# Patient Record
Sex: Female | Born: 1935 | Race: White | Hispanic: No | Smoking: Former smoker
Health system: Southern US, Community
[De-identification: ages and names within clinical notes are randomized; demographics above are authoritative.]

## PROBLEM LIST (undated history)

## (undated) DIAGNOSIS — I5042 Chronic combined systolic (congestive) and diastolic (congestive) heart failure: Secondary | ICD-10-CM

## (undated) DIAGNOSIS — I482 Chronic atrial fibrillation, unspecified: Secondary | ICD-10-CM

## (undated) DIAGNOSIS — I480 Paroxysmal atrial fibrillation: Secondary | ICD-10-CM

## (undated) DIAGNOSIS — IMO0001 Reserved for inherently not codable concepts without codable children: Secondary | ICD-10-CM

## (undated) DIAGNOSIS — I10 Essential (primary) hypertension: Secondary | ICD-10-CM

## (undated) DIAGNOSIS — K219 Gastro-esophageal reflux disease without esophagitis: Secondary | ICD-10-CM

## (undated) DIAGNOSIS — G8929 Other chronic pain: Secondary | ICD-10-CM

## (undated) DIAGNOSIS — Z950 Presence of cardiac pacemaker: Secondary | ICD-10-CM

## (undated) DIAGNOSIS — N189 Chronic kidney disease, unspecified: Secondary | ICD-10-CM

## (undated) DIAGNOSIS — M549 Dorsalgia, unspecified: Secondary | ICD-10-CM

## (undated) DIAGNOSIS — M1990 Unspecified osteoarthritis, unspecified site: Secondary | ICD-10-CM

## (undated) DIAGNOSIS — Z952 Presence of prosthetic heart valve: Secondary | ICD-10-CM

## (undated) DIAGNOSIS — IMO0002 Reserved for concepts with insufficient information to code with codable children: Secondary | ICD-10-CM

## (undated) DIAGNOSIS — I890 Lymphedema, not elsewhere classified: Secondary | ICD-10-CM

## (undated) HISTORY — DX: Lymphedema, not elsewhere classified: I89.0

## (undated) HISTORY — DX: Paroxysmal atrial fibrillation: I48.0

## (undated) HISTORY — DX: Chronic kidney disease, unspecified: N18.9

## (undated) HISTORY — PX: US ECHOCARDIOGRAPHY: HXRAD669

## (undated) HISTORY — PX: BREAST LUMPECTOMY: SHX2

## (undated) HISTORY — PX: CARDIAC CATHETERIZATION: SHX172

## (undated) HISTORY — PX: CARDIAC VALVE REPLACEMENT: SHX585

---
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
## 2006-10-08 ENCOUNTER — Ambulatory Visit: Payer: Self-pay | Admitting: Family Medicine

## 2006-10-13 ENCOUNTER — Ambulatory Visit: Payer: Self-pay | Admitting: Family Medicine

## 2006-11-12 ENCOUNTER — Ambulatory Visit: Payer: Self-pay | Admitting: Surgery

## 2006-12-03 ENCOUNTER — Other Ambulatory Visit: Payer: Self-pay

## 2006-12-03 ENCOUNTER — Ambulatory Visit: Payer: Self-pay | Admitting: Specialist

## 2006-12-10 ENCOUNTER — Ambulatory Visit: Payer: Self-pay | Admitting: Surgery

## 2012-02-29 ENCOUNTER — Emergency Department: Payer: Self-pay | Admitting: *Deleted

## 2012-03-22 ENCOUNTER — Inpatient Hospital Stay: Payer: Self-pay | Admitting: Internal Medicine

## 2012-03-22 LAB — COMPREHENSIVE METABOLIC PANEL
Albumin: 3.8 g/dL (ref 3.4–5.0)
Alkaline Phosphatase: 85 U/L (ref 50–136)
Calcium, Total: 9 mg/dL (ref 8.5–10.1)
Co2: 27 mmol/L (ref 21–32)
EGFR (African American): >60
Glucose: 129 mg/dL — ABNORMAL HIGH (ref 65–99)
SGOT(AST): 23 U/L (ref 15–37)
Sodium: 139 mmol/L (ref 136–145)

## 2012-03-22 LAB — CBC WITH DIFFERENTIAL/PLATELET
Basophil #: 0 10*3/uL (ref 0.0–0.1)
Basophil %: 0.4 %
Eosinophil %: 0.1 %
Lymphocyte #: 0.7 10*3/uL — ABNORMAL LOW (ref 1.0–3.6)
Lymphocyte %: 5.7 %
MCV: 92 fL (ref 80–100)
Monocyte %: 3.4 %
Neutrophil #: 10.9 10*3/uL — ABNORMAL HIGH (ref 1.4–6.5)
Neutrophil %: 90.4 %
Platelet: 261 10*3/uL (ref 150–440)
RBC: 4.34 10*6/uL (ref 3.80–5.20)
RDW: 13.6 % (ref 11.5–14.5)
WBC: 12 10*3/uL — ABNORMAL HIGH (ref 3.6–11.0)

## 2012-03-22 LAB — URINALYSIS, COMPLETE
Bilirubin,UR: NEGATIVE
Blood: NEGATIVE
Ketone: NEGATIVE
Nitrite: NEGATIVE
Ph: 6 (ref 4.5–8.0)
Protein: NEGATIVE
RBC,UR: 2 /HPF (ref 0–5)
Squamous Epithelial: 3

## 2012-03-22 LAB — PROTIME-INR
INR: 1
Prothrombin Time: 13.8 secs (ref 11.5–14.7)

## 2012-03-22 LAB — DIGOXIN LEVEL: Digoxin: 1.41 ng/mL

## 2012-03-22 LAB — APTT: Activated PTT: 30 secs (ref 23.6–35.9)

## 2012-03-23 LAB — PROTIME-INR: INR: 1.3

## 2012-03-23 LAB — BASIC METABOLIC PANEL
Anion Gap: 10 (ref 7–16)
Creatinine: 0.92 mg/dL (ref 0.60–1.30)
EGFR (African American): >60
EGFR (Non-African Amer.): >60
Glucose: 92 mg/dL (ref 65–99)
Potassium: 4 mmol/L (ref 3.5–5.1)
Sodium: 140 mmol/L (ref 136–145)

## 2012-03-23 LAB — CBC WITH DIFFERENTIAL/PLATELET
Basophil %: 0.4 %
HGB: 12.8 g/dL (ref 12.0–16.0)
Lymphocyte #: 0.9 10*3/uL — ABNORMAL LOW (ref 1.0–3.6)
MCH: 31.7 pg (ref 26.0–34.0)
MCV: 93 fL (ref 80–100)
Monocyte #: 0.8 x10 3/mm (ref 0.2–0.9)
Neutrophil %: 85 %
Platelet: 207 10*3/uL (ref 150–440)
RBC: 4.03 10*6/uL (ref 3.80–5.20)
RDW: 13.7 % (ref 11.5–14.5)

## 2012-03-23 LAB — APTT: Activated PTT: 85.6 secs — ABNORMAL HIGH (ref 23.6–35.9)

## 2012-03-23 LAB — URINE CULTURE

## 2012-03-24 LAB — CBC WITH DIFFERENTIAL/PLATELET
Basophil #: 0 10*3/uL (ref 0.0–0.1)
Eosinophil %: 0.7 %
Lymphocyte #: 0.8 10*3/uL — ABNORMAL LOW (ref 1.0–3.6)
Lymphocyte %: 8.3 %
MCH: 31.8 pg (ref 26.0–34.0)
MCHC: 34.6 g/dL (ref 32.0–36.0)
MCV: 92 fL (ref 80–100)
Monocyte %: 9.1 %
Neutrophil #: 7.6 10*3/uL — ABNORMAL HIGH (ref 1.4–6.5)
Neutrophil %: 81.5 %
Platelet: 175 10*3/uL (ref 150–440)
RBC: 3.83 10*6/uL (ref 3.80–5.20)
RDW: 13.6 % (ref 11.5–14.5)
WBC: 9.3 10*3/uL (ref 3.6–11.0)

## 2012-03-24 LAB — BASIC METABOLIC PANEL
Anion Gap: 6 — ABNORMAL LOW (ref 7–16)
BUN: 13 mg/dL (ref 7–18)
Calcium, Total: 7.9 mg/dL — ABNORMAL LOW (ref 8.5–10.1)
Chloride: 106 mmol/L (ref 98–107)
Co2: 28 mmol/L (ref 21–32)
Creatinine: 1.07 mg/dL (ref 0.60–1.30)
EGFR (Non-African Amer.): 51 — ABNORMAL LOW
Glucose: 93 mg/dL (ref 65–99)
Osmolality: 279 (ref 275–301)
Potassium: 4.5 mmol/L (ref 3.5–5.1)
Sodium: 140 mmol/L (ref 136–145)

## 2012-03-24 LAB — APTT: Activated PTT: 139.5 secs — ABNORMAL HIGH (ref 23.6–35.9)

## 2012-03-25 LAB — CBC WITH DIFFERENTIAL/PLATELET
Basophil #: 0.1 10*3/uL (ref 0.0–0.1)
Eosinophil #: 0.3 10*3/uL (ref 0.0–0.7)
Eosinophil %: 3.7 %
HCT: 34.5 % — ABNORMAL LOW (ref 35.0–47.0)
Lymphocyte #: 0.6 10*3/uL — ABNORMAL LOW (ref 1.0–3.6)
MCHC: 34.6 g/dL (ref 32.0–36.0)
MCV: 92 fL (ref 80–100)
Monocyte %: 12.4 %
Neutrophil %: 75.1 %
Platelet: 163 10*3/uL (ref 150–440)
RBC: 3.76 10*6/uL — ABNORMAL LOW (ref 3.80–5.20)
RDW: 13.7 % (ref 11.5–14.5)
WBC: 7.7 10*3/uL (ref 3.6–11.0)

## 2012-03-25 LAB — COMPREHENSIVE METABOLIC PANEL
Anion Gap: 10 (ref 7–16)
Bilirubin,Total: 0.5 mg/dL (ref 0.2–1.0)
Calcium, Total: 7.8 mg/dL — ABNORMAL LOW (ref 8.5–10.1)
Chloride: 107 mmol/L (ref 98–107)
Co2: 23 mmol/L (ref 21–32)
EGFR (African American): >60
SGPT (ALT): 39 U/L (ref 12–78)
Sodium: 140 mmol/L (ref 136–145)
Total Protein: 5.6 g/dL — ABNORMAL LOW (ref 6.4–8.2)

## 2012-03-25 LAB — PROTIME-INR
INR: 1.5
Prothrombin Time: 18.7 secs — ABNORMAL HIGH (ref 11.5–14.7)

## 2012-03-26 LAB — PROTIME-INR
INR: 1.8
Prothrombin Time: 21.1 secs — ABNORMAL HIGH (ref 11.5–14.7)

## 2012-03-26 LAB — CBC WITH DIFFERENTIAL/PLATELET
Basophil %: 0.6 %
Eosinophil %: 7 %
HCT: 34.3 % — ABNORMAL LOW (ref 35.0–47.0)
HGB: 11.9 g/dL — ABNORMAL LOW (ref 12.0–16.0)
Lymphocyte #: 1 10*3/uL (ref 1.0–3.6)
MCH: 31.7 pg (ref 26.0–34.0)
MCHC: 34.8 g/dL (ref 32.0–36.0)
MCV: 91 fL (ref 80–100)
Monocyte #: 0.8 x10 3/mm (ref 0.2–0.9)
Monocyte %: 11.8 %
Neutrophil #: 4.4 10*3/uL (ref 1.4–6.5)
Neutrophil %: 66.2 %
Platelet: 189 10*3/uL (ref 150–440)
RBC: 3.76 10*6/uL — ABNORMAL LOW (ref 3.80–5.20)

## 2012-03-26 LAB — BASIC METABOLIC PANEL
BUN: 9 mg/dL (ref 7–18)
Chloride: 110 mmol/L — ABNORMAL HIGH (ref 98–107)
EGFR (Non-African Amer.): >60
Glucose: 98 mg/dL (ref 65–99)
Osmolality: 280 (ref 275–301)
Potassium: 4.4 mmol/L (ref 3.5–5.1)
Sodium: 141 mmol/L (ref 136–145)

## 2012-03-26 LAB — APTT
Activated PTT: 122.7 secs — ABNORMAL HIGH (ref 23.6–35.9)
Activated PTT: 63.6 secs — ABNORMAL HIGH (ref 23.6–35.9)
Activated PTT: 63.2 secs — ABNORMAL HIGH (ref 23.6–35.9)

## 2012-03-27 LAB — CBC WITH DIFFERENTIAL/PLATELET
Basophil #: 0.1 10*3/uL (ref 0.0–0.1)
Eosinophil #: 0.4 10*3/uL (ref 0.0–0.7)
Eosinophil %: 7 %
Lymphocyte #: 1.2 10*3/uL (ref 1.0–3.6)
Lymphocyte %: 19.6 %
Monocyte #: 0.7 x10 3/mm (ref 0.2–0.9)
Monocyte %: 11 %
Neutrophil %: 61.4 %
Platelet: 198 10*3/uL (ref 150–440)
RBC: 3.91 10*6/uL (ref 3.80–5.20)
RDW: 13.8 % (ref 11.5–14.5)
WBC: 6.1 10*3/uL (ref 3.6–11.0)

## 2012-03-27 LAB — APTT: Activated PTT: >160 secs (ref 23.6–35.9)

## 2012-03-27 LAB — BASIC METABOLIC PANEL
BUN: 10 mg/dL (ref 7–18)
Calcium, Total: 8.3 mg/dL — ABNORMAL LOW (ref 8.5–10.1)
Co2: 21 mmol/L (ref 21–32)
EGFR (African American): >60
EGFR (Non-African Amer.): >60
Glucose: 96 mg/dL (ref 65–99)
Sodium: 143 mmol/L (ref 136–145)

## 2012-03-27 LAB — PROTIME-INR: Prothrombin Time: 25.3 secs — ABNORMAL HIGH (ref 11.5–14.7)

## 2013-10-21 ENCOUNTER — Ambulatory Visit: Payer: Self-pay | Admitting: Internal Medicine

## 2013-11-03 ENCOUNTER — Ambulatory Visit: Payer: Self-pay | Admitting: Internal Medicine

## 2013-11-11 ENCOUNTER — Ambulatory Visit: Payer: Self-pay | Admitting: Internal Medicine

## 2013-11-12 LAB — PATHOLOGY REPORT

## 2013-11-28 DIAGNOSIS — M159 Polyosteoarthritis, unspecified: Secondary | ICD-10-CM | POA: Insufficient documentation

## 2013-11-28 DIAGNOSIS — E785 Hyperlipidemia, unspecified: Secondary | ICD-10-CM | POA: Insufficient documentation

## 2014-09-06 DIAGNOSIS — I441 Atrioventricular block, second degree: Secondary | ICD-10-CM | POA: Insufficient documentation

## 2014-09-19 DIAGNOSIS — I5042 Chronic combined systolic (congestive) and diastolic (congestive) heart failure: Secondary | ICD-10-CM

## 2014-09-19 DIAGNOSIS — I5022 Chronic systolic (congestive) heart failure: Secondary | ICD-10-CM | POA: Insufficient documentation

## 2014-09-28 DIAGNOSIS — Z7901 Long term (current) use of anticoagulants: Secondary | ICD-10-CM | POA: Insufficient documentation

## 2014-10-18 NOTE — Discharge Summary (Signed)
PATIENT NAME:  [name], [name] MR#:  [id number] DATE OF BIRTH:  10-28-1934  DATE OF ADMISSION:  03/22/2012 DATE OF DISCHARGE:  03/27/2012  DISCHARGE DIAGNOSES:  1. Acute colitis with fever.  2. Acute cystitis.  3. Leg hematoma. 4. Dehydration.  5. History of mitral valve replacement.  6. Osteoarthritis.   DISCHARGE MEDICATIONS:  1. Potassium chloride 2 tabs 3 times daily. 2. Digoxin 250 micrograms daily.  3. Furosemide 80 mg b.i.d.  4. Vitamin C daily.  5. Warfarin 8.5 mg daily.  6. Doxycycline 100 mg b.i.d. x5 days.  7. Metronidazole 500 mg t.i.d. x3 days.  8. Lomotil t.i.d. p.r.n. diarrhea. 9. Florastor probiotic one b.i.d.  10. Omeprazole 20 mg daily.   REASON FOR ADMISSION: 80 year old female presents with fever, diarrhea, and abdominal pain.   HOSPITAL COURSE: The patient was admitted, started on IV antibiotics, continued to have a fever, initially started on Rocephin, thought there may be allergy versus lack of response, switched over to doxycycline and metronidazole. C. difficile was negative. White count improved dramatically. Fever eventually went away. She was aggressively hydrated with improvement, but became volume overloaded and Lasix prescribed with resolution of that with normal oxygenation. Echocardiogram showed normal LV systolic function. Her leg hematoma remained stable. Hemoglobin is stable. She was started on heparin, Coumadin restarted. No evidence of recurrent bleeding in that leg. ProTime was 2.3 on discharge. Will go back to her baseline dose of warfarin, still having some mild diarrhea, is going to be treated with Lomotil, likely brought on by her colitis, but no sign of Clostridium difficile and is on metronidazole. Symptomatically, she feels fine. She is up and around. Physical therapy was very helpful to her with that left leg. Overall prognosis is guarded. Follow up with Dr. Sabra Heck one week    ____________________________ Rusty Aus,  MD mfm:ap D: 03/27/2012 08:09:46 ET T: 03/27/2012 16:12:19 ET JOB#: [id number]  cc: Rusty Aus, MD, <Dictator> Sheli Dorin Roselee Culver MD ELECTRONICALLY SIGNED 03/30/2012 8:00
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
## 2014-10-27 ENCOUNTER — Encounter: Admit: 2014-10-27 | Disposition: A | Discharge status: Home / Self Care | Payer: Self-pay

## 2014-11-02 ENCOUNTER — Encounter: Payer: Medicare Other | Attending: Internal Medicine | Admitting: *Deleted

## 2014-11-02 DIAGNOSIS — Z954 Presence of other heart-valve replacement: Secondary | ICD-10-CM | POA: Insufficient documentation

## 2014-11-02 DIAGNOSIS — Z952 Presence of prosthetic heart valve: Secondary | ICD-10-CM

## 2014-11-02 NOTE — Progress Notes (Signed)
Daily Session Note  Patient Details  Name: [name] MRN: [id number] Date of Birth: [birth year] Referring Provider:  Rusty Aus, MD  Encounter Date: 11/02/2014  Check In:     Session Check In - 11/02/14 1752    Check-In   Staff Present Gerlene Burdock RN, BSN;Katiria Calame Joya Gaskins RN, BSN;Renee Dillard Essex MS, ACSM CEP Exercise Physiologist   ER physicians immediately available to respond to emergencies See telemetry face sheet for immediately available ER MD   Warm-up and Cool-down Performed on first and last piece of equipment   VAD Patient? No   Pain Assessment   Currently in Pain? No/denies         Goals Met:  Exercise tolerated well No cardiac symptoms reported  Goals Unmet:  Not Applicable  Goals Comments:    Dr. [name] is Medical Director for [facility].

## 2014-11-03 DIAGNOSIS — Z954 Presence of other heart-valve replacement: Secondary | ICD-10-CM | POA: Diagnosis not present

## 2014-11-03 DIAGNOSIS — Z9889 Other specified postprocedural states: Secondary | ICD-10-CM

## 2014-11-03 NOTE — Progress Notes (Signed)
Daily Session Note  Patient Details  Name: [name] MRN: [id number] Date of Birth: 10-23-1934 Referring Provider:  Di Kindle, MD  Encounter Date: 11/03/2014  Check In:     Session Check In - 11/03/14 1645    Check-In   Staff Present Gerlene Burdock RN, BSN;Aquil Duhe BS, ACSM EP-C, Exercise Physiologist;Diane Mariana Arn, BSN   ER physicians immediately available to respond to emergencies See telemetry face sheet for immediately available ER MD   Warm-up and Cool-down Performed on first and last piece of equipment   VAD Patient? No   Pain Assessment   Currently in Pain? No/denies         Goals Met:  Proper associated with RPD/PD & O2 Sat Exercise tolerated well  Goals Unmet:  Not Applicable  Goals Comments:    Dr. [name] is Medical Director for [facility].

## 2014-11-04 DIAGNOSIS — Z952 Presence of prosthetic heart valve: Secondary | ICD-10-CM | POA: Insufficient documentation

## 2014-11-04 NOTE — Progress Notes (Addendum)
Cardiac Individual Treatment Plan  Patient Details  Name: ANALIESE KRUPKA MRN: [id number] Date of Birth: [birth year] Referring Provider:  Di Kindle, MD  Initial Encounter Date:    Patient's Home Medications on Admission:  Current outpatient prescriptions:  .  amiodarone (PACERONE) 200 MG tablet, TAKE 1 TABLET EVERY DAY, Disp: , Rfl:  .  aspirin EC 81 MG tablet, Take by mouth., Disp: , Rfl:  .  furosemide (LASIX) 40 MG tablet, Take by mouth. 1/2 tablet 2 times a day or 1 tablet 2 times a day, Disp: , Rfl:  .  Multiple Vitamin (MULTI-VITAMINS) TABS, Take by mouth., Disp: , Rfl:  .  pantoprazole (PROTONIX) 40 MG tablet, Take by mouth., Disp: , Rfl:  .  senna-docusate (SENOKOT-S) 8.6-50 MG per tablet, Take by mouth., Disp: , Rfl:  .  spironolactone (ALDACTONE) 25 MG tablet, Take 1 tablet by mouth 2 (two) times daily., Disp: , Rfl:  .  warfarin (COUMADIN) 5 MG tablet, Take by mouth., Disp: , Rfl:  .  acetaminophen (TYLENOL) 325 MG tablet, Take by mouth., Disp: , Rfl:  .  Honey 5 GM/5ML SYRP, Take by mouth., Disp: , Rfl:  .  metoprolol (LOPRESSOR) 50 MG tablet, Take by mouth., Disp: , Rfl:  .  vitamin C (ASCORBIC ACID) 500 MG tablet, Take by mouth., Disp: , Rfl:   Past Medical History: No past medical history on file.  Tobacco Use: History  Smoking status  . Not on file  Smokeless tobacco  . Not on file    Labs:     Recent Review Flowsheet Data    There is no flowsheet data to display.       Exercise Target Goals:    Exercise Program Goal: Individual exercise prescription set with THRR, safety & activity barriers. Participant demonstrates ability to understand and report RPE using BORG scale, to self-measure pulse accurately, and to acknowledge the importance of the exercise prescription.  Exercise Prescription Goal: Starting with aerobic activity 30 plus minutes a day, 3 days per week for initial exercise prescription. Provide home exercise prescription and  guidelines that participant acknowledges understanding prior to discharge.  Activity Barriers & Risk Stratification:   6 Minute Walk:   Initial Exercise Prescription:   Exercise Prescription Changes:   Discharge Exercise Prescription:   Nutrition:  Target Goals: Understanding of nutrition guidelines, daily intake of sodium <152m, cholesterol <2042m calories 30% from fat and 7% or less from saturated fats, daily to have 5 or more servings of fruits and vegetables.  Biometrics:    Nutrition Therapy Plan and Nutrition Goals:   Nutrition Discharge: Rate Your Plate Scores:   Nutrition Goals Re-Evaluation:   Psychosocial: Target Goals: Acknowledge presence or absence of depression, maximize coping skills, provide positive support system. Participant is able to verbalize types and ability to use techniques and skills needed for reducing stress and depression.  Initial Review & Psychosocial Screening:     Initial Psych Review & Screening - 11/02/14 [id number]      Quality of Life Scores:   PHQ-9:     Recent Review Flowsheet Data    There is no flowsheet data to display.      Psychosocial Evaluation and Intervention:     Psychosocial Evaluation - 11/02/14 1643    Psychosocial Evaluation & Interventions   Interventions Stress management education;Relaxation education;Encouraged to exercise with the program and follow exercise prescription   Comments Counselor met with  Ms. Apollo today for initial psychosocial evaluation.  She is a 80 year old who recently had mitral valve replacement.  She reports having a strong support system with an adult daughter who lives with her since Ms. S's spouse died 5 years ago, and a son who lives close by.  Ms. Chauncey Cruel also is actively involved in her local church.  She reports difficulty sleeping with her heart racing when she goes to bed.  Then her sleep is interrupted after 2-3 hours.   Counselor recommended speaking with a pharmacist about natural melatonin sustained release since Ms. S was hesitant to speak with Dr., for fear of getting "dependent" on another medication.  Ms. Chauncey Cruel was accompanied to Cardiac Rehab today by her adult son, who is very concerned about his mother.  Ms. Chauncey Cruel denies a history of depression or current symptoms.  However, there is a concern that she may be experiencing symptoms of anxiety with the reports of a "racing heart" at times.  Counselor will continue to follow with Ms. Chauncey Cruel.     Continued Psychosocial Services Needed Yes  Ms. S will benefit from attending the psychoeducational components of this program as well as consistent exercise.  She also needs to address her sleep issues and "racing heart."      Psychosocial Re-Evaluation:   Vocational Rehabilitation: Provide vocational rehab assistance to qualifying candidates.   Vocational Rehab Evaluation & Intervention:   Education: Education Goals: Education classes will be provided on a weekly basis, covering required topics. Participant will state understanding/return demonstration of topics presented.  Learning Barriers/Preferences:   Education Topics: General Nutrition Guidelines/Fats and Fiber: -Group instruction provided by verbal, written material, models and posters to present the general guidelines for heart healthy nutrition. Gives an explanation and review of dietary fats and fiber.   Controlling Sodium/Reading Food Labels: -Group verbal and written material supporting the discussion of sodium use in heart healthy nutrition. Review and explanation with models, verbal and written materials for utilization of the food label.   Exercise Physiology & Risk Factors: - Group verbal and written instruction with models to review the exercise physiology of the cardiovascular system and associated critical values. Details cardiovascular disease risk factors and the goals associated with each risk  factor.   Aerobic Exercise & Resistance Training: - Gives group verbal and written discussion on the health impact of inactivity. On the components of aerobic and resistive training programs and the benefits of this training and how to safely progress through these programs.   Flexibility, Balance, General Exercise Guidelines: - Provides group verbal and written instruction on the benefits of flexibility and balance training programs. Provides general exercise guidelines with specific guidelines to those with heart or lung disease. Demonstration and skill practice provided.   Stress Management: - Provides group verbal and written instruction about the health risks of elevated stress, cause of high stress, and healthy ways to reduce stress.   Depression: - Provides group verbal and written instruction on the correlation between heart/lung disease and depressed mood, treatment options, and the stigmas associated with seeking treatment.   Anatomy & Physiology of the Heart: - Group verbal and written instruction and models provide basic cardiac anatomy and physiology, with the coronary electrical and arterial systems. Review of: AMI, Angina, Valve disease, Heart Failure, Cardiac Arrhythmia, Pacemakers, and the ICD.   Cardiac Procedures: - Group verbal and written instruction and models to describe the testing methods done to diagnose heart disease. Reviews the outcomes of the test results. Describes  the treatment choices: Medical Management, Angioplasty, or Coronary Bypass Surgery.   Cardiac Medications: - Group verbal and written instruction to review commonly prescribed medications for heart disease. Reviews the medication, class of the drug, and side effects. Includes the steps to properly store meds and maintain the prescription regimen.   Go Sex-Intimacy & Heart Disease, Get SMART - Goal Setting: - Group verbal and written instruction through game format to discuss heart disease and  the return to sexual intimacy. Provides group verbal and written material to discuss and apply goal setting through the application of the S.M.A.R.T. Method.   Other Matters of the Heart: - Provides group verbal, written materials and models to describe Heart Failure, Angina, Valve Disease, and Diabetes in the realm of heart disease. Includes description of the disease process and treatment options available to the cardiac patient.   Exercise & Equipment Safety: - Individual verbal instruction and demonstration of equipment use and safety with use of the equipment.   Infection Prevention: - Provides verbal and written material to individual with discussion of infection control including proper hand washing and proper equipment cleaning during exercise session.   Falls Prevention: - Provides verbal and written material to individual with discussion of falls prevention and safety.   Diabetes: - Individual verbal and written instruction to review signs/symptoms of diabetes, desired ranges of glucose level fasting, after meals and with exercise. Advice that pre and post exercise glucose checks will be done for 3 sessions at entry of program.    Knowledge Questionnaire Score:   Personal Goals and Risk Factors at Admission:   Personal Goals and Risk Factors Review:    Personal Goals Discharge:     Comments:

## 2014-11-04 NOTE — Addendum Note (Signed)
Addended by: Gerlene Burdock on: 11/04/2014 04:45 PM   Modules accepted: Medications

## 2014-11-07 DIAGNOSIS — Z954 Presence of other heart-valve replacement: Secondary | ICD-10-CM | POA: Diagnosis not present

## 2014-11-07 DIAGNOSIS — Z952 Presence of prosthetic heart valve: Secondary | ICD-10-CM

## 2014-11-07 NOTE — Progress Notes (Signed)
Daily Session Note  Patient Details  Name: [name] MRN: [id number] Date of Birth: [birth year] Referring Provider:  Di Kindle, MD  Encounter Date: 11/07/2014  Check In:     Session Check In - 11/07/14 1633    Check-In   Staff Present Heath Lark RN, BSN, CCRP;Steven Way BS, ACSM EP-C, Exercise Physiologist;Carroll Enterkin RN, BSN   ER physicians immediately available to respond to emergencies See telemetry face sheet for immediately available ER MD   Warm-up and Cool-down Performed on first and last piece of equipment   VAD Patient? No   Pain Assessment   Currently in Pain? No/denies         Goals Met:  Independence with exercise equipment Exercise tolerated well  Goals Unmet:  Not Applicable  Goals Comments: No complaints of cardiac symptoms today.    Dr. [name] is Medical Director for [facility] and [facility].

## 2014-11-09 ENCOUNTER — Encounter: Payer: Medicare Other | Admitting: *Deleted

## 2014-11-09 DIAGNOSIS — Z952 Presence of prosthetic heart valve: Secondary | ICD-10-CM

## 2014-11-09 DIAGNOSIS — Z954 Presence of other heart-valve replacement: Secondary | ICD-10-CM | POA: Diagnosis not present

## 2014-11-09 NOTE — Progress Notes (Signed)
Daily Session Note  Patient Details  Name: SAMYUKTA CURA MRN: [id number] Date of Birth: 17-Mar-1935 Referring Provider:  Di Kindle, MD  Encounter Date: 11/09/2014  Check In:     Session Check In - 11/09/14 1623    Check-In   Staff Present Gerlene Burdock RN, BSN;Niveah Boerner Dillard Essex MS, ACSM CEP Exercise Physiologist;Diane Joya Gaskins RN, BSN   Medication changes reported     No   Fall or balance concerns reported    No   Warm-up and Cool-down Performed on first and last piece of equipment   VAD Patient? No   Pain Assessment   Currently in Pain? No/denies   Multiple Pain Sites No           Exercise Prescription Changes - 11/09/14 1600    Exercise Review   Progression Yes   REL-XR   Level 3   Watts 40   Minutes 15      Goals Met:  Proper associated with RPD/PD & O2 Sat Independence with exercise equipment Exercise tolerated well Personal goals reviewed No report of cardiac concerns or symptoms  Goals Unmet:  Not Applicable  Goals Comments: No cardiac symptoms reported; daily exercise goals completed    Dr. [name] is Medical Director for [place] and [facility].

## 2014-11-10 DIAGNOSIS — Z954 Presence of other heart-valve replacement: Secondary | ICD-10-CM | POA: Diagnosis not present

## 2014-11-10 DIAGNOSIS — Z952 Presence of prosthetic heart valve: Secondary | ICD-10-CM

## 2014-11-10 NOTE — Progress Notes (Signed)
Daily Session Note  Patient Details  Name: [name] MRN: [id number] Date of Birth: 02-12-1935 Referring Provider:  Di Kindle, MD  Encounter Date: 11/10/2014  Check In:     Session Check In - 11/10/14 1644    Check-In   Staff Present Lestine Box BS, ACSM EP-C, Exercise Physiologist;Carroll Enterkin RN, BSN;Diane Joya Gaskins RN, BSN   ER physicians immediately available to respond to emergencies See telemetry face sheet for immediately available ER MD   Medication changes reported     No   Fall or balance concerns reported    No   Warm-up and Cool-down Performed on first and last piece of equipment   VAD Patient? No   Pain Assessment   Currently in Pain? No/denies         Goals Met:  Proper associated with RPD/PD & O2 Sat Exercise tolerated well No report of cardiac concerns or symptoms Strength training completed today  Goals Unmet:  Not Applicable  Goals Comments:   Dr. [name] is Medical Director for [place] and [facility].

## 2014-11-14 DIAGNOSIS — Z952 Presence of prosthetic heart valve: Secondary | ICD-10-CM

## 2014-11-14 DIAGNOSIS — Z954 Presence of other heart-valve replacement: Secondary | ICD-10-CM | POA: Diagnosis not present

## 2014-11-14 NOTE — Progress Notes (Signed)
Daily Session Note  Patient Details  Name: JAYLENE ARROWOOD MRN: [id number] Date of Birth: 13-Oct-1934 Referring Provider:  Di Kindle, MD  Encounter Date: 11/14/2014  Check In:     Session Check In - 11/14/14 1631    Check-In   Staff Present Heath Lark RN, BSN, CCRP;Emalene Welte BS, ACSM EP-C, Exercise Physiologist;Carroll Enterkin RN, BSN   ER physicians immediately available to respond to emergencies See telemetry face sheet for immediately available ER MD   Medication changes reported     No   Fall or balance concerns reported    No   Warm-up and Cool-down Performed on first and last piece of equipment   VAD Patient? No   Pain Assessment   Currently in Pain? No/denies         Goals Met:  Proper associated with RPD/PD & O2 Sat Exercise tolerated well No report of cardiac concerns or symptoms Strength training completed today  Goals Unmet:  Not Applicable  Goals Comments:    Dr. [name] is Medical Director for [place] and [facility].

## 2014-11-16 ENCOUNTER — Encounter: Payer: Medicare Other | Admitting: *Deleted

## 2014-11-16 DIAGNOSIS — Z954 Presence of other heart-valve replacement: Secondary | ICD-10-CM | POA: Diagnosis not present

## 2014-11-16 DIAGNOSIS — Z952 Presence of prosthetic heart valve: Secondary | ICD-10-CM

## 2014-11-16 NOTE — Progress Notes (Signed)
Daily Session Note  Patient Details  Name: [name] MRN: [id number] Date of Birth: December 01, 1934 Referring Provider:  Di Kindle, MD  Encounter Date: 11/16/2014  Check In:     Session Check In - 11/16/14 1829    Check-In   Staff Present Gerlene Burdock RN, BSN;Renee Dillard Essex MS, ACSM CEP Exercise Physiologist;Alisa Stjames Mariana Arn, BSN   ER physicians immediately available to respond to emergencies See telemetry face sheet for immediately available ER MD   Medication changes reported     No   Fall or balance concerns reported    No   Warm-up and Cool-down Performed on first and last piece of equipment   VAD Patient? No   Pain Assessment   Currently in Pain? No/denies   Multiple Pain Sites No           Exercise Prescription Changes - 11/16/14 1800    NuStep   Level 4   Watts 40      Goals Met:  Exercise tolerated well Personal goals reviewed No report of cardiac concerns or symptoms  Goals Unmet:  Not Applicable  Goals Comments:    Dr. [name] is Medical Director for [place] and [facility].

## 2014-11-17 DIAGNOSIS — Z952 Presence of prosthetic heart valve: Secondary | ICD-10-CM

## 2014-11-17 DIAGNOSIS — Z954 Presence of other heart-valve replacement: Secondary | ICD-10-CM | POA: Diagnosis not present

## 2014-11-17 NOTE — Progress Notes (Signed)
Daily Session Note  Patient Details  Name: [name] MRN: [id number] Date of Birth: [birth year] Referring Provider:  Di Kindle, MD  Encounter Date: 11/17/2014  Check In:     Session Check In - 11/17/14 1617    Check-In   Staff Present Lestine Box BS, ACSM EP-C, Exercise Physiologist;Carroll Enterkin RN, BSN;Diane Joya Gaskins RN, BSN   ER physicians immediately available to respond to emergencies See telemetry face sheet for immediately available ER MD   Medication changes reported     No   Fall or balance concerns reported    No   Warm-up and Cool-down Performed on first and last piece of equipment   VAD Patient? No   Pain Assessment   Currently in Pain? No/denies           Exercise Prescription Changes - 11/16/14 1800    NuStep   Level 4   Watts 40      Goals Met:  Proper associated with RPD/PD & O2 Sat Exercise tolerated well No report of cardiac concerns or symptoms Strength training completed today  Goals Unmet:  Not Applicable  Goals Comments:    Dr. [name] is Medical Director for [place] and [facility].

## 2014-11-20 ENCOUNTER — Encounter: Payer: Self-pay | Admitting: *Deleted

## 2014-11-20 NOTE — Progress Notes (Signed)
Input data from previous EMR to update the Individualized Treatment Plan.

## 2014-11-21 DIAGNOSIS — Z954 Presence of other heart-valve replacement: Secondary | ICD-10-CM | POA: Diagnosis not present

## 2014-11-21 DIAGNOSIS — Z952 Presence of prosthetic heart valve: Secondary | ICD-10-CM

## 2014-11-21 NOTE — Progress Notes (Signed)
Daily Session Note  Patient Details  Name: [name] MRN: [id number] Date of Birth: 19-Apr-1935 Referring Provider:  Di Kindle, MD  Encounter Date: 11/21/2014  Check In:     Session Check In - 11/21/14 1631    Check-In   Staff Present Heath Lark RN, BSN, CCRP;Carroll Enterkin RN, BSN;Milas Schappell BS, ACSM EP-C, Exercise Physiologist   ER physicians immediately available to respond to emergencies See telemetry face sheet for immediately available ER MD   Medication changes reported     No   Fall or balance concerns reported    No   Warm-up and Cool-down Performed on first and last piece of equipment   VAD Patient? No   Pain Assessment   Currently in Pain? No/denies           Exercise Prescription Changes - 11/21/14 1200    Exercise Review   Progression Yes   Response to Exercise   Blood Pressure (Admit) 116/72 mmHg   Blood Pressure (Exercise) 128/74 mmHg   Blood Pressure (Exit) 110/60 mmHg   Heart Rate (Admit) 94 bpm   Heart Rate (Exercise) 110 bpm   Heart Rate (Exit) 75 bpm   Rating of Perceived Exertion (Exercise) 12   Duration Progress to 30 minutes of continuous aerobic without signs/symptoms of physical distress   Intensity THRR unchanged   Progression Continue progressive overload as per policy without signs/symptoms or physical distress.   NuStep   Level 4   Watts 40   Minutes 25   REL-XR   Level 4   Watts 50   Minutes 15      Goals Met:  Proper associated with RPD/PD & O2 Sat Exercise tolerated well No report of cardiac concerns or symptoms Strength training completed today  Goals Unmet:  Not Applicable  Goals Comments:    Dr. [name] is Medical Director for [place] and [facility].

## 2014-11-21 NOTE — Progress Notes (Signed)
Daily Session Note  Patient Details  Name: [name] MRN: [id number] Date of Birth: May 31, 1935 Referring Provider:  Di Kindle, MD  Encounter Date: 11/21/2014  Check In:     Session Check In - 11/21/14 1631    Check-In   Staff Present Heath Lark RN, BSN, CCRP;Carroll Enterkin RN, BSN;Steven Way BS, ACSM EP-C, Exercise Physiologist   ER physicians immediately available to respond to emergencies See telemetry face sheet for immediately available ER MD   Medication changes reported     No   Fall or balance concerns reported    No   Warm-up and Cool-down Performed on first and last piece of equipment   VAD Patient? No   Pain Assessment   Currently in Pain? No/denies           Exercise Prescription Changes - 11/21/14 1200    Exercise Review   Progression Yes   Response to Exercise   Blood Pressure (Admit) 116/72 mmHg   Blood Pressure (Exercise) 128/74 mmHg   Blood Pressure (Exit) 110/60 mmHg   Heart Rate (Admit) 94 bpm   Heart Rate (Exercise) 110 bpm   Heart Rate (Exit) 75 bpm   Rating of Perceived Exertion (Exercise) 12   Duration Progress to 30 minutes of continuous aerobic without signs/symptoms of physical distress   Intensity THRR unchanged   Progression Continue progressive overload as per policy without signs/symptoms or physical distress.   NuStep   Level 4   Watts 40   Minutes 25   REL-XR   Level 4   Watts 50   Minutes 15      Goals Met:  Independence with exercise equipment Exercise tolerated well No report of cardiac concerns or symptoms  Goals Unmet:  Not Applicable  Goals Comments:    Dr. [name] is Medical Director for [place] and [facility].

## 2014-11-21 NOTE — Progress Notes (Signed)
Cardiac Individual Treatment Plan  Patient Details  Name: [name] MRN: [id number] Date of Birth: Feb 28, 1935 Referring Provider:  Di Kindle, MD  Initial Encounter Date:    Patient'[name] Home Medications on Admission:  Current outpatient prescriptions:  .  acetaminophen (TYLENOL) 325 MG tablet, Take by mouth., Disp: , Rfl:  .  amiodarone (PACERONE) 200 MG tablet, TAKE 1 TABLET EVERY DAY, Disp: , Rfl:  .  aspirin EC 81 MG tablet, Take by mouth., Disp: , Rfl:  .  furosemide (LASIX) 40 MG tablet, Take by mouth. 1/2 tablet 2 times a day or 1 tablet 2 times a day, Disp: , Rfl:  .  Honey 5 GM/5ML SYRP, Take by mouth., Disp: , Rfl:  .  metoprolol (LOPRESSOR) 50 MG tablet, Take by mouth., Disp: , Rfl:  .  Multiple Vitamin (MULTI-VITAMINS) TABS, Take by mouth., Disp: , Rfl:  .  pantoprazole (PROTONIX) 40 MG tablet, Take by mouth., Disp: , Rfl:  .  spironolactone (ALDACTONE) 25 MG tablet, Take 1 tablet by mouth 2 (two) times daily., Disp: , Rfl:  .  vitamin C (ASCORBIC ACID) 500 MG tablet, Take by mouth., Disp: , Rfl:  .  warfarin (COUMADIN) 5 MG tablet, Take by mouth., Disp: , Rfl:   Past Medical History: No past medical history on file.  Tobacco Use: History  Smoking status  . Not on file  Smokeless tobacco  . Not on file    Labs: Recent Review Flowsheet Data    There is no flowsheet data to display.       Exercise Target Goals:    Exercise Program Goal: Individual exercise prescription set with THRR, safety & activity barriers. Participant demonstrates ability to understand and report RPE using BORG scale, to self-measure pulse accurately, and to acknowledge the importance of the exercise prescription.  Exercise Prescription Goal: Starting with aerobic activity 30 plus minutes a day, 3 days per week for initial exercise prescription. Provide home exercise prescription and guidelines that participant acknowledges understanding prior to discharge.  Activity  Barriers & Risk Stratification:     Activity Barriers & Risk Stratification - 11/20/14 0913    Activity Barriers & Risk Stratification   Activity Barriers Arthritis;Joint Problems   Risk Stratification High      6 Minute Walk:     6 Minute Walk      10/28/14 0915       6 Minute Walk   Phase Initial     Distance 730 feet     Walk Time 6 minutes     Resting HR 75 bpm     Resting BP 124/60 mmHg     Max Ex. BP 110/64 mmHg     RPE 15     Symptoms No        Initial Exercise Prescription:   Exercise Prescription Changes:     Exercise Prescription Changes      11/09/14 1600 11/16/14 1800 11/21/14 1200       Exercise Review   Progression Yes  Yes     Response to Exercise   Blood Pressure (Admit)   116/72 mmHg     Blood Pressure (Exercise)   128/74 mmHg     Blood Pressure (Exit)   110/60 mmHg     Heart Rate (Admit)   94 bpm     Heart Rate (Exercise)   110 bpm     Heart Rate (Exit)   75 bpm     Rating of Perceived Exertion (Exercise)  12     Duration   Progress to 30 minutes of continuous aerobic without signs/symptoms of physical distress     Intensity   THRR unchanged     Progression   Continue progressive overload as per policy without signs/symptoms or physical distress.     NuStep   Level  4 4     Watts  40 40     Minutes   25     REL-XR   Level 3  4     Watts 40  50     Minutes 15  15        Discharge Exercise Prescription:   Nutrition:  Target Goals: Understanding of nutrition guidelines, daily intake of sodium <1519m, cholesterol <2037m calories 30% from fat and 7% or less from saturated fats, daily to have 5 or more servings of fruits and vegetables.  Biometrics:     Pre Biometrics - 10/28/14 0916    Pre Biometrics   Height 5' 6.6" (1.692 m)   Weight 174 lb 11.2 oz (79.243 kg)   Waist Circumference 35 inches   Hip Circumference 42.5 inches   Waist to Hip Ratio 0.82 %   BMI (Calculated) 27.7       Nutrition Therapy Plan and Nutrition  Goals:   Nutrition Discharge: Rate Your Plate Scores:   Nutrition Goals Re-Evaluation:     Nutrition Goals Re-Evaluation      11/17/14 1700           Personal Goal #1 Re-Evaluation   Personal Goal #1 Wants to lose a few lbs so tries to eat healthier.        Goal Progress Seen Yes          Psychosocial: Target Goals: Acknowledge presence or absence of depression, maximize coping skills, provide positive support system. Participant is able to verbalize types and ability to use techniques and skills needed for reducing stress and depression.  Initial Review & Psychosocial Screening:     Initial Psych Review & Screening - 11/02/14 [place]      Quality of Life Scores:     Quality of Life - 10/28/14 0917    Quality of Life Scores   Health/Function Pre 15.81 %   Socioeconomic Pre 25.21 %   Psych/Spiritual Pre 25.43 %   Family Pre 25.13 %   GLOBAL Pre 21.31 %      PHQ-9:     Recent Review Flowsheet Data    There is no flowsheet data to display.      Psychosocial Evaluation and Intervention:     Psychosocial Evaluation - 11/02/14 1643    Psychosocial Evaluation & Interventions   Interventions Stress management education;Relaxation education;Encouraged to exercise with the program and follow exercise prescription   Comments Counselor met with [name] for initial psychosocial evaluation.  She is a [age]ear old who recently had mitral valve replacement.  She reports having a strong support system with an adult daughter who lives with her since [name]'[name] spouse died 5 years ago, and a son who lives close by.  [name] is actively involved in her local church.  She reports difficulty sleeping with her heart racing when she goes to bed.  Then her sleep is interrupted after 2-3 hours.  Counselor recommended speaking with a pharmacist about natural melatonin sustained release since [name] was hesitant to speak with Dr., for  fear of getting "dependent"  on another medication.  [name] was accompanied to Cardiac Rehab today by her adult son, who is very concerned about his mother.  [name] denies a history of depression or current symptoms.  However, there is a concern that she may be experiencing symptoms of anxiety with the reports of a "racing heart" at times.  Counselor will continue to follow with [name].     Continued Psychosocial Services Needed Yes  [name] will benefit from attending the psychoeducational components of this program as well as consistent exercise.  She also needs to address her sleep issues and "racing heart."      Psychosocial Re-Evaluation:   Vocational Rehabilitation: Provide vocational rehab assistance to qualifying candidates.   Vocational Rehab Evaluation & Intervention:     Vocational Rehab - 10/27/14 0914    Initial Vocational Rehab Evaluation & Intervention   Assessment shows need for Vocational Rehabilitation No      Education: Education Goals: Education classes will be provided on a weekly basis, covering required topics. Participant will state understanding/return demonstration of topics presented.  Learning Barriers/Preferences:     Learning Barriers/Preferences - 11/20/14 0914    Learning Barriers/Preferences   Learning Barriers None   Learning Preferences None      Education Topics: General Nutrition Guidelines/Fats and Fiber: -Group instruction provided by verbal, written material, models and posters to present the general guidelines for heart healthy nutrition. Gives an explanation and review of dietary fats and fiber.   Controlling Sodium/Reading Food Labels: -Group verbal and written material supporting the discussion of sodium use in heart healthy nutrition. Review and explanation with models, verbal and written materials for utilization of the food label.   Exercise Physiology & Risk Factors: - Group verbal and written instruction with models to review the  exercise physiology of the cardiovascular system and associated critical values. Details cardiovascular disease risk factors and the goals associated with each risk factor.   Aerobic Exercise & Resistance Training: - Gives group verbal and written discussion on the health impact of inactivity. On the components of aerobic and resistive training programs and the benefits of this training and how to safely progress through these programs.   Flexibility, Balance, General Exercise Guidelines: - Provides group verbal and written instruction on the benefits of flexibility and balance training programs. Provides general exercise guidelines with specific guidelines to those with heart or lung disease. Demonstration and skill practice provided.          Most Recent Value   Date  11/21/14   Educator  SB   Instruction Review Code  2- meets goals/outcomes      Stress Management: - Provides group verbal and written instruction about the health risks of elevated stress, cause of high stress, and healthy ways to reduce stress.   Depression: - Provides group verbal and written instruction on the correlation between heart/lung disease and depressed mood, treatment options, and the stigmas associated with seeking treatment.   Anatomy & Physiology of the Heart: - Group verbal and written instruction and models provide basic cardiac anatomy and physiology, with the coronary electrical and arterial systems. Review of: AMI, Angina, Valve disease, Heart Failure, Cardiac Arrhythmia, Pacemakers, and the ICD.   Cardiac Procedures: - Group verbal and written instruction and models to describe the testing methods done to diagnose heart disease. Reviews the outcomes of the test results. Describes the treatment choices: Medical Management, Angioplasty, or Coronary Bypass Surgery.   Cardiac Medications: - Group verbal and written instruction to  review commonly prescribed medications for heart disease. Reviews  the medication, class of the drug, and side effects. Includes the steps to properly store meds and maintain the prescription regimen.   Go Sex-Intimacy & Heart Disease, Get SMART - Goal Setting: - Group verbal and written instruction through game format to discuss heart disease and the return to sexual intimacy. Provides group verbal and written material to discuss and apply goal setting through the application of the [name].M.A.R.T. Method.   Other Matters of the Heart: - Provides group verbal, written materials and models to describe Heart Failure, Angina, Valve Disease, and Diabetes in the realm of heart disease. Includes description of the disease process and treatment options available to the cardiac patient.   Exercise & Equipment Safety: - Individual verbal instruction and demonstration of equipment use and safety with use of the equipment.   Infection Prevention: - Provides verbal and written material to individual with discussion of infection control including proper hand washing and proper equipment cleaning during exercise session.   Falls Prevention: - Provides verbal and written material to individual with discussion of falls prevention and safety.   Diabetes: - Individual verbal and written instruction to review signs/symptoms of diabetes, desired ranges of glucose level fasting, after meals and with exercise. Advice that pre and post exercise glucose checks will be done for 3 sessions at entry of program.    Knowledge Questionnaire Score:     Knowledge Questionnaire Score - 10/27/14 0914    Knowledge Questionnaire Score   Pre Score 22/28      Personal Goals and Risk Factors at Admission:     Personal Goals and Risk Factors at Admission - 11/20/14 0916    Personal Goals and Risk Factors on Admission   Increase Aerobic Exercise and Physical Activity Yes   Intervention While in program, learn and follow the exercise prescription taught. Start at a low level workload  and increase workload after able to maintain previous level for 30 minutes. Increase time before increasing intensity.   Diabetes No   Hypertension No   Lipids Yes   Goal Cholesterol controlled with medications as prescribed, with individualized exercise RX and with personalized nutrition plan. Value goals: LDL < 11m, HDL > 421m Participant states understanding of desired cholesterol values and following prescriptions.   Intervention Provide nutrition & aerobic exercise along with prescribed medications to achieve LDL <7073mHDL >26m13m Stress No      Personal Goals and Risk Factors Review:      Goals and Risk Factor Review      11/17/14 1700           Increase Aerobic Exercise and Physical Activity   Goals Progress/Improvement seen  Yes       Comments Has increased her watts on the NUstep by increasing her exercise. Said she feels better usually after she exercises.          Personal Goals Discharge:     Comments:30 day review

## 2014-11-23 ENCOUNTER — Encounter: Payer: Medicare Other | Admitting: *Deleted

## 2014-11-23 DIAGNOSIS — Z952 Presence of prosthetic heart valve: Secondary | ICD-10-CM

## 2014-11-23 DIAGNOSIS — Z954 Presence of other heart-valve replacement: Secondary | ICD-10-CM | POA: Diagnosis not present

## 2014-11-23 NOTE — Progress Notes (Signed)
Daily Session Note  Patient Details  Name: BELLINA TOKARCZYK MRN: [id number] Date of Birth: [birth year] Referring Provider:  Di Kindle, MD  Encounter Date: 11/23/2014  Check In:     Session Check In - 11/23/14 1808    Check-In   Staff Present Candiss Norse MS, ACSM CEP Exercise Physiologist;Diane Joya Gaskins RN, BSN;Carroll Enterkin RN, BSN   ER physicians immediately available to respond to emergencies See telemetry face sheet for immediately available ER MD   Medication changes reported     No   Fall or balance concerns reported    No   Warm-up and Cool-down Performed on first and last piece of equipment   VAD Patient? No   Pain Assessment   Currently in Pain? No/denies   Multiple Pain Sites No         Goals Met:  Exercise tolerated well No report of cardiac concerns or symptoms Strength training completed today  Goals Unmet:  Not Applicable  Goals Comments:    Dr. [name] is Medical Director for [place] and [facility].

## 2014-11-24 ENCOUNTER — Other Ambulatory Visit: Payer: Self-pay | Admitting: *Deleted

## 2014-11-24 DIAGNOSIS — Z952 Presence of prosthetic heart valve: Secondary | ICD-10-CM

## 2014-11-24 DIAGNOSIS — Z954 Presence of other heart-valve replacement: Secondary | ICD-10-CM | POA: Diagnosis not present

## 2014-11-24 NOTE — Progress Notes (Signed)
Daily Session Note  Patient Details  Name: [name] MRN: [id number] Date of Birth: 05/15/1935 Referring Provider:  Schroder, Jacob N, MD  Encounter Date: 11/24/2014  Check In:     Session Check In - 11/24/14 1649    Check-In   Staff Present   BS, ACSM EP-C, Exercise Physiologist;Diane Wright RN, BSN;Carroll Enterkin RN, BSN   ER physicians immediately available to respond to emergencies See telemetry face sheet for immediately available ER MD   Medication changes reported     No   Fall or balance concerns reported    No   Warm-up and Cool-down Performed on first and last piece of equipment   VAD Patient? No   Pain Assessment   Currently in Pain? No/denies         Goals Met:  Proper associated with RPD/PD & O2 Sat Exercise tolerated well No report of cardiac concerns or symptoms Strength training completed today  Goals Unmet:  Not Applicable  Goals Comments:    Dr. Mark Miller is Medical Director for [facility] and [facility]. 

## 2014-11-30 ENCOUNTER — Encounter: Payer: Medicare Other | Attending: Surgery | Admitting: *Deleted

## 2014-11-30 DIAGNOSIS — Z952 Presence of prosthetic heart valve: Secondary | ICD-10-CM

## 2014-11-30 DIAGNOSIS — Z954 Presence of other heart-valve replacement: Secondary | ICD-10-CM | POA: Diagnosis present

## 2014-11-30 NOTE — Progress Notes (Signed)
Daily Session Note  Patient Details  Name: [name] MRN: [id number] Date of Birth: [birth year] Referring Provider:  Di Kindle, MD  Encounter Date: 11/30/2014  Check In:     Session Check In - 11/30/14 1636    Check-In   Staff Present Candiss Norse MS, ACSM CEP Exercise Physiologist;Carroll Enterkin RN, BSN;Diane Joya Gaskins RN, BSN   ER physicians immediately available to respond to emergencies See telemetry face sheet for immediately available ER MD   Medication changes reported     No   Fall or balance concerns reported    No   Warm-up and Cool-down Performed on first and last piece of equipment   VAD Patient? No   Pain Assessment   Currently in Pain? No/denies   Multiple Pain Sites No           Exercise Prescription Changes - 11/30/14 1600    Treadmill   MPH 1.2   Grade 0   Minutes 15   NuStep   Level 4   Watts 45   Minutes 25   REL-XR   Level 4   Watts 50   Minutes 15      Goals Met:  Independence with exercise equipment Exercise tolerated well Personal goals reviewed No report of cardiac concerns or symptoms Strength training completed today  Goals Unmet:  Not Applicable  Goals Comments:    Dr. [name] is Medical Director for [place] and [facility].

## 2014-12-01 ENCOUNTER — Encounter: Payer: Medicare Other | Admitting: *Deleted

## 2014-12-01 DIAGNOSIS — Z954 Presence of other heart-valve replacement: Secondary | ICD-10-CM | POA: Diagnosis not present

## 2014-12-01 DIAGNOSIS — Z952 Presence of prosthetic heart valve: Secondary | ICD-10-CM

## 2014-12-01 NOTE — Progress Notes (Signed)
Daily Session Note  Patient Details  Name: LORELLA GOMEZ MRN: [id number] Date of Birth: 09-23-1934 Referring Provider:  Di Kindle, MD  Encounter Date: 12/01/2014  Check In:     Session Check In - 12/01/14 [place]    Check-In   Staff Present Nyoka Cowden RN;Susanne Bice RN, BSN, CCRP;Diane Joya Gaskins RN, BSN   ER physicians immediately available to respond to emergencies See telemetry face sheet for immediately available ER MD   Medication changes reported     No   Fall or balance concerns reported    No   Warm-up and Cool-down Performed on first and last piece of equipment   VAD Patient? No   Pain Assessment   Currently in Pain? No/denies   Multiple Pain Sites No         Goals Met:  Exercise tolerated well No report of cardiac concerns or symptoms Strength training completed today  Goals Unmet:  Not Applicable  Goals Comments:    Dr. [name] is Medical Director for [place] and [facility].

## 2014-12-05 DIAGNOSIS — Z952 Presence of prosthetic heart valve: Secondary | ICD-10-CM

## 2014-12-05 DIAGNOSIS — Z954 Presence of other heart-valve replacement: Secondary | ICD-10-CM | POA: Diagnosis not present

## 2014-12-05 NOTE — Progress Notes (Signed)
Daily Session Note  Patient Details  Name: [name] MRN: [id number] Date of Birth: [birth year] Referring Provider:  Di Kindle, MD  Encounter Date: 12/05/2014  Check In:     Session Check In - 12/05/14 1621    Check-In   Staff Present Heath Lark RN, BSN, CCRP;Sabatino Williard BS, ACSM EP-C, Exercise Physiologist;Carroll Enterkin RN, BSN   ER physicians immediately available to respond to emergencies See telemetry face sheet for immediately available ER MD   Medication changes reported     No   Fall or balance concerns reported    No   Warm-up and Cool-down Performed on first and last piece of equipment   VAD Patient? No   Pain Assessment   Currently in Pain? No/denies         Goals Met:  Proper associated with RPD/PD & O2 Sat Exercise tolerated well No report of cardiac concerns or symptoms Strength training completed today  Goals Unmet:  Not Applicable  Goals Comments:    Dr. [name] is Medical Director for [place] and [facility].

## 2014-12-07 ENCOUNTER — Encounter: Payer: Medicare Other | Admitting: *Deleted

## 2014-12-07 DIAGNOSIS — Z952 Presence of prosthetic heart valve: Secondary | ICD-10-CM

## 2014-12-07 DIAGNOSIS — Z954 Presence of other heart-valve replacement: Secondary | ICD-10-CM | POA: Diagnosis not present

## 2014-12-07 NOTE — Progress Notes (Signed)
Daily Session Note  Patient Details  Name: [name] MRN: [id number] Date of Birth: [birth year] Referring Provider:  Di Kindle, MD  Encounter Date: 12/07/2014  Check In:     Session Check In - 12/07/14 1703    Check-In   Staff Present Candiss Norse MS, ACSM CEP Exercise Physiologist;Evolett Somarriba RN, BSN;Diane Joya Gaskins RN, BSN   ER physicians immediately available to respond to emergencies See telemetry face sheet for immediately available ER MD   Medication changes reported     No   Fall or balance concerns reported    No   Warm-up and Cool-down Performed on first and last piece of equipment   VAD Patient? No   Pain Assessment   Currently in Pain? No/denies         Goals Met:  Proper associated with RPD/PD & O2 Sat Exercise tolerated well  Goals Unmet:  Not Applicable  Goals Comments:    Dr. [name] is Medical Director for [place] and [facility].

## 2014-12-08 DIAGNOSIS — Z954 Presence of other heart-valve replacement: Secondary | ICD-10-CM | POA: Diagnosis not present

## 2014-12-08 DIAGNOSIS — Z952 Presence of prosthetic heart valve: Secondary | ICD-10-CM

## 2014-12-08 NOTE — Progress Notes (Signed)
Daily Session Note  Patient Details  Name: [name] MRN: [id number] Date of Birth: 1934/09/15 Referring Provider:  Di Kindle, MD  Encounter Date: 12/08/2014  Check In:     Session Check In - 12/08/14 1622    Check-In   Staff Present Gerlene Burdock RN, BSN;Diane Joya Gaskins RN, BSN;Steven Way BS, ACSM EP-C, Exercise Physiologist   ER physicians immediately available to respond to emergencies See telemetry face sheet for immediately available ER MD   Medication changes reported     No   Fall or balance concerns reported    No   Warm-up and Cool-down Performed on first and last piece of equipment   VAD Patient? No   Pain Assessment   Currently in Pain? No/denies           Exercise Prescription Changes - 12/07/14 1700    Exercise Review   Progression Yes   Treadmill   MPH 1.8   Grade 0   NuStep   Level 5   Watts 50   REL-XR   Level 4   Watts 50   Minutes 15      Goals Met:  Proper associated with RPD/PD & O2 Sat Exercise tolerated well  Goals Unmet:  Not Applicable  Goals Comments:    Dr. [name] is Medical Director for [place] [facility].

## 2014-12-12 DIAGNOSIS — Z954 Presence of other heart-valve replacement: Secondary | ICD-10-CM | POA: Diagnosis not present

## 2014-12-12 DIAGNOSIS — Z952 Presence of prosthetic heart valve: Secondary | ICD-10-CM

## 2014-12-12 NOTE — Progress Notes (Signed)
Daily Session Note  Patient Details  Name: ASTRAEA GAUGHRAN MRN: [id number] Date of Birth: 09-[name]-1935 Referring Provider:  Di Kindle, MD  Encounter Date: 12/12/2014  Check In:     Session Check In - 12/12/14 1622    Check-In   Staff Present Heath Lark RN, BSN, CCRP;Steven Way BS, ACSM EP-C, Exercise Physiologist;Carroll Radio producer, BSN   ER physicians immediately available to respond to emergencies See telemetry face sheet for immediately available ER MD   Medication changes reported     No   Fall or balance concerns reported    No   Warm-up and Cool-down Performed on first and last piece of equipment   VAD Patient? No   Pain Assessment   Currently in Pain? No/denies         Goals Met:  Independence with exercise equipment Exercise tolerated well No report of cardiac concerns or symptoms  Goals Unmet:  Not Applicable  Goals Comments:    Dr. [name] is Medical Director for [place] [facility].

## 2014-12-12 NOTE — Progress Notes (Signed)
Daily Session Note  Patient Details  Name: AKIYA MORR MRN: [id number] Date of Birth: 1934/12/17 Referring Provider:  Di Kindle, MD  Encounter Date: 12/12/2014  Check In:     Session Check In - 12/12/14 1622    Check-In   Staff Present Heath Lark RN, BSN, CCRP;Anushree Dorsi BS, ACSM EP-C, Exercise Physiologist;Carroll Radio producer, BSN   ER physicians immediately available to respond to emergencies See telemetry face sheet for immediately available ER MD   Medication changes reported     No   Fall or balance concerns reported    No   Warm-up and Cool-down Performed on first and last piece of equipment   VAD Patient? No   Pain Assessment   Currently in Pain? No/denies         Goals Met:  Proper associated with RPD/PD & O2 Sat Exercise tolerated well No report of cardiac concerns or symptoms Strength training completed today  Goals Unmet:  Not Applicable  Goals Comments:    Dr. [name] is Medical Director for [place] and [facility].

## 2014-12-14 ENCOUNTER — Encounter: Payer: Medicare Other | Admitting: *Deleted

## 2014-12-14 DIAGNOSIS — Z954 Presence of other heart-valve replacement: Secondary | ICD-10-CM | POA: Diagnosis not present

## 2014-12-14 DIAGNOSIS — Z9889 Other specified postprocedural states: Secondary | ICD-10-CM

## 2014-12-14 NOTE — Progress Notes (Signed)
Daily Session Note  Patient Details  Name: [name] MRN: [id number] Date of Birth: 14-[name]-1935 Referring Provider:  Di Kindle, MD  Encounter Date: 12/14/2014  Check In:     Session Check In - 12/14/14 1728    Check-In   Staff Present Nyoka Cowden RN;Carroll Enterkin RN, Drusilla Kanner MS, ACSM CEP Exercise Physiologist   ER physicians immediately available to respond to emergencies See telemetry face sheet for immediately available ER MD   Medication changes reported     No   Fall or balance concerns reported    No   Warm-up and Cool-down Performed on first and last piece of equipment   VAD Patient? No   Pain Assessment   Currently in Pain? No/denies   Multiple Pain Sites No           Exercise Prescription Changes - 12/14/14 1700    Exercise Review   Progression Yes   Response to Exercise   Frequency Add 1 additional day to program exercise sessions.   Duration Progress to 30 minutes of continuous aerobic without signs/symptoms of physical distress   Intensity THRR unchanged   Progression Continue progressive overload as per policy without signs/symptoms or physical distress.   Resistance Training   Training Prescription Yes   Weight 2   Reps 10-15   Treadmill   MPH 1.8   Grade 0   Minutes 20   NuStep   Level 5   Watts 50   Minutes 25   REL-XR   Level 4   Watts 50   Minutes 15      Goals Met:  Independence with exercise equipment Exercise tolerated well Personal goals reviewed No report of cardiac concerns or symptoms Strength training completed today  Goals Unmet:  Not Applicable  Goals Comments:   Dr. [name] is Medical Director for [place] [facility].

## 2014-12-21 ENCOUNTER — Encounter: Payer: Self-pay | Admitting: *Deleted

## 2014-12-21 ENCOUNTER — Encounter: Payer: Medicare Other | Admitting: *Deleted

## 2014-12-21 VITALS — Ht 66.6 in | Wt 164.0 lb

## 2014-12-21 DIAGNOSIS — Z9889 Other specified postprocedural states: Secondary | ICD-10-CM

## 2014-12-21 DIAGNOSIS — Z952 Presence of prosthetic heart valve: Secondary | ICD-10-CM

## 2014-12-21 DIAGNOSIS — Z954 Presence of other heart-valve replacement: Secondary | ICD-10-CM | POA: Diagnosis not present

## 2014-12-21 NOTE — Progress Notes (Signed)
Daily Session Note  Patient Details  Name: [name] MRN: [id number] Date of Birth: 04/26/1935 Referring Provider:  Di Kindle, MD  Encounter Date: 12/21/2014  Check In:     Session Check In - 12/21/14 1646    Check-In   Staff Present Candiss Norse MS, ACSM CEP Exercise Physiologist;Diane Joya Gaskins RN, BSN;Ettel Albergo RN, BSN   ER physicians immediately available to respond to emergencies See telemetry face sheet for immediately available ER MD   Medication changes reported     No   Fall or balance concerns reported    No   Warm-up and Cool-down Performed on first and last piece of equipment   VAD Patient? No   Pain Assessment   Currently in Pain? No/denies           Exercise Prescription Changes - 12/20/14 1700    Exercise Review   Progression Yes   Response to Exercise   Blood Pressure (Admit) 114/72 mmHg   Blood Pressure (Exercise) 138/80 mmHg   Blood Pressure (Exit) 110/60 mmHg   Heart Rate (Admit) 98 bpm   Heart Rate (Exercise) 107 bpm   Heart Rate (Exit) 100 bpm   Frequency Add 1 additional day to program exercise sessions.   Duration Progress to 30 minutes of continuous aerobic without signs/symptoms of physical distress   Intensity THRR unchanged   Progression Continue progressive overload as per policy without signs/symptoms or physical distress.   Resistance Training   Training Prescription Yes   Weight 2   Reps 10-15   Treadmill   MPH 2   Grade 0   Minutes 20   NuStep   Level 5   Watts 55   Minutes 25   REL-XR   Level 4   Watts 50   Minutes 15      Goals Met:  Proper associated with RPD/PD & O2 Sat Exercise tolerated well  Goals Unmet:  Not Applicable  Goals Comments:    Dr. [name] is Medical Director for [place] and [facility].

## 2014-12-21 NOTE — Progress Notes (Signed)
Discharge Summary  Patient Details  Name: [name] MRN: [id number] Date of Birth: 1934-07-20 Referring Provider:  Di Kindle, MD   Number of Visits:   Reason for Discharge:  Patient reached a stable level of exercise. Patient independent in their exercise.  Smoking History:  History  Smoking status  . Not on file  Smokeless tobacco  . Not on file    Diagnosis:  S/P MVR (mitral valve replacement)  ADL UCSD:   Initial Exercise Prescription:   Discharge Exercise Prescription (Final Exercise Prescription Changes):     Exercise Prescription Changes - 12/20/14 1700    Exercise Review   Progression Yes   Response to Exercise   Blood Pressure (Admit) 114/72 mmHg   Blood Pressure (Exercise) 138/80 mmHg   Blood Pressure (Exit) 110/60 mmHg   Heart Rate (Admit) 98 bpm   Heart Rate (Exercise) 107 bpm   Heart Rate (Exit) 100 bpm   Frequency Add 1 additional day to program exercise sessions.   Duration Progress to 30 minutes of continuous aerobic without signs/symptoms of physical distress   Intensity THRR unchanged   Progression Continue progressive overload as per policy without signs/symptoms or physical distress.   Resistance Training   Training Prescription Yes   Weight 2   Reps 10-15   Treadmill   MPH 2   Grade 0   Minutes 20   NuStep   Level 5   Watts 55   Minutes 25   REL-XR   Level 4   Watts 50   Minutes 15      Functional Capacity:     6 Minute Walk      10/28/14 0915 12/21/14 1648     6 Minute Walk   Phase Initial Discharge    Distance 730 feet 1118 feet    Walk Time 6 minutes 6 minutes    Resting HR 75 bpm 85 bpm    Resting BP 124/60 mmHg 118/60 mmHg    Max Ex. HR  113 bpm    Max Ex. BP 110/64 mmHg 142/60 mmHg    RPE 15 13    Symptoms No        Psychological, QOL, Others - Outcomes: PHQ 2/9: No flowsheet data found.  Quality of Life:     Quality of Life - 10/28/14 0917    Quality of Life Scores   Health/Function Pre 15.81 %   Socioeconomic Pre 25.21 %   Psych/Spiritual Pre 25.43 %   Family Pre 25.13 %   GLOBAL Pre 21.31 %      Personal Goals: Goals established at orientation with interventions provided to work toward goal.     Personal Goals and Risk Factors at Admission - 11/20/14 0916    Personal Goals and Risk Factors on Admission   Increase Aerobic Exercise and Physical Activity Yes   Intervention While in program, learn and follow the exercise prescription taught. Start at a low level workload and increase workload after able to maintain previous level for 30 minutes. Increase time before increasing intensity.   Diabetes No   Hypertension No   Lipids Yes   Goal Cholesterol controlled with medications as prescribed, with individualized exercise RX and with personalized nutrition plan. Value goals: LDL < 70mg , HDL > 40mg . Participant states understanding of desired cholesterol values and following prescriptions.   Intervention Provide nutrition & aerobic exercise along with prescribed medications to achieve LDL 70mg , HDL >40mg .   Stress No       Personal  Goals Discharge:   Nutrition & Weight - Outcomes:     Pre Biometrics - 10/28/14 0916    Pre Biometrics   Height 5' 6.6" (1.692 m)   Weight 174 lb 11.2 oz (79.243 kg)   Waist Circumference 35 inches   Hip Circumference 42.5 inches   Waist to Hip Ratio 0.82 %   BMI (Calculated) 27.7         Post Biometrics - 12/21/14 1649     Post  Biometrics   Height 5' 6.6" (1.692 m)   Weight 164 lb (74.39 kg)   Waist Circumference 33.5 inches   Hip Circumference 41 inches   Waist to Hip Ratio 0.82 %   BMI (Calculated) 26      Nutrition:   Nutrition Discharge:   Education Questionnaire Score:     Knowledge Questionnaire Score - 10/27/14 0914    Knowledge Questionnaire Score   Pre Score 22/28      Goals reviewed with patient; copy given to patient.  Exercise Plans: On the [employer].

## 2014-12-21 NOTE — Progress Notes (Signed)
Cardiac Individual Treatment Plan  Patient Details  Name: [name] MRN: [id number] Date of Birth: 1934-10-12 Referring Provider:  Dr. Leigh Aurora Initial Encounter Date:  11/16/2014  Visit Diagnosis: No diagnosis found.  Patient's Home Medications on Admission:  Current outpatient prescriptions:  .  acetaminophen (TYLENOL) 325 MG tablet, Take by mouth., Disp: , Rfl:  .  amiodarone (PACERONE) 200 MG tablet, TAKE 1 TABLET EVERY DAY, Disp: , Rfl:  .  aspirin EC 81 MG tablet, Take by mouth., Disp: , Rfl:  .  furosemide (LASIX) 40 MG tablet, Take by mouth. 1/2 tablet 2 times a day or 1 tablet 2 times a day, Disp: , Rfl:  .  Honey 5 GM/5ML SYRP, Take by mouth., Disp: , Rfl:  .  metoprolol (LOPRESSOR) 50 MG tablet, Take by mouth., Disp: , Rfl:  .  Multiple Vitamin (MULTI-VITAMINS) TABS, Take by mouth., Disp: , Rfl:  .  pantoprazole (PROTONIX) 40 MG tablet, Take by mouth., Disp: , Rfl:  .  spironolactone (ALDACTONE) 25 MG tablet, Take 1 tablet by mouth 2 (two) times daily., Disp: , Rfl:  .  vitamin C (ASCORBIC ACID) 500 MG tablet, Take by mouth., Disp: , Rfl:  .  warfarin (COUMADIN) 5 MG tablet, Take by mouth., Disp: , Rfl:   Past Medical History: No past medical history on file.  Tobacco Use: History  Smoking status  . Not on file  Smokeless tobacco  . Not on file    Labs: Recent Review Flowsheet Data    There is no flowsheet data to display.       Exercise Target Goals:    Exercise Program Goal: Individual exercise prescription set with THRR, safety & activity barriers. Participant demonstrates ability to understand and report RPE using BORG scale, to self-measure pulse accurately, and to acknowledge the importance of the exercise prescription.  Exercise Prescription Goal: Starting with aerobic activity 30 plus minutes a day, 3 days per week for initial exercise prescription. Provide home exercise prescription and guidelines that participant acknowledges  understanding prior to discharge.  Activity Barriers & Risk Stratification:     Activity Barriers & Risk Stratification - 11/20/14 0913    Activity Barriers & Risk Stratification   Activity Barriers Arthritis;Joint Problems   Risk Stratification High      6 Minute Walk:     6 Minute Walk      10/28/14 0915       6 Minute Walk   Phase Initial     Distance 730 feet     Walk Time 6 minutes     Resting HR 75 bpm     Resting BP 124/60 mmHg     Max Ex. BP 110/64 mmHg     RPE 15     Symptoms No        Initial Exercise Prescription:   Exercise Prescription Changes:     Exercise Prescription Changes      11/09/14 1600 11/16/14 1800 11/21/14 1200 11/30/14 1600 12/07/14 1700   Exercise Review   Progression Yes  Yes  Yes   Response to Exercise   Blood Pressure (Admit)   116/72 mmHg     Blood Pressure (Exercise)   128/74 mmHg     Blood Pressure (Exit)   110/60 mmHg     Heart Rate (Admit)   94 bpm     Heart Rate (Exercise)   110 bpm     Heart Rate (Exit)   75 bpm     Rating  of Perceived Exertion (Exercise)   12     Duration   Progress to 30 minutes of continuous aerobic without signs/symptoms of physical distress     Intensity   THRR unchanged     Progression   Continue progressive overload as per policy without signs/symptoms or physical distress.     Treadmill   MPH    1.2 1.8   Grade    0 0   Minutes    15    NuStep   Level  4 4 4 5    Watts  40 40 45 50   Minutes   25 25    REL-XR   Level 3  4 4 4    Watts 40  50 50 50   Minutes 15  15 15 15      12/14/14 1700 12/20/14 1700         Exercise Review   Progression Yes Yes      Response to Exercise   Blood Pressure (Admit)  114/72 mmHg      Blood Pressure (Exercise)  138/80 mmHg      Blood Pressure (Exit)  110/60 mmHg      Heart Rate (Admit)  98 bpm      Heart Rate (Exercise)  107 bpm      Heart Rate (Exit)  100 bpm      Frequency Add 1 additional day to program exercise sessions. Add 1 additional day to  program exercise sessions.      Duration Progress to 30 minutes of continuous aerobic without signs/symptoms of physical distress Progress to 30 minutes of continuous aerobic without signs/symptoms of physical distress      Intensity THRR unchanged THRR unchanged      Progression Continue progressive overload as per policy without signs/symptoms or physical distress. Continue progressive overload as per policy without signs/symptoms or physical distress.      Resistance Training   Training Prescription Yes Yes      Weight 2 2      Reps 10-15 10-15      Treadmill   MPH 1.8 2      Grade 0 0      Minutes 20 20      NuStep   Level 5 5      Watts 50 55      Minutes 25 25      REL-XR   Level 4 4      Watts 50 50      Minutes 15 15         Discharge Exercise Prescription (Final Exercise Prescription Changes):     Exercise Prescription Changes - 12/20/14 1700    Exercise Review   Progression Yes   Response to Exercise   Blood Pressure (Admit) 114/72 mmHg   Blood Pressure (Exercise) 138/80 mmHg   Blood Pressure (Exit) 110/60 mmHg   Heart Rate (Admit) 98 bpm   Heart Rate (Exercise) 107 bpm   Heart Rate (Exit) 100 bpm   Frequency Add 1 additional day to program exercise sessions.   Duration Progress to 30 minutes of continuous aerobic without signs/symptoms of physical distress   Intensity THRR unchanged   Progression Continue progressive overload as per policy without signs/symptoms or physical distress.   Resistance Training   Training Prescription Yes   Weight 2   Reps 10-15   Treadmill   MPH 2   Grade 0   Minutes 20   NuStep   Level 5   Watts 55  Minutes 25   REL-XR   Level 4   Watts 50   Minutes 15      Nutrition:  Target Goals: Understanding of nutrition guidelines, daily intake of sodium <151m, cholesterol <2029m calories 30% from fat and 7% or less from saturated fats, daily to have 5 or more servings of fruits and vegetables.  Biometrics:     Pre  Biometrics - 10/28/14 0916    Pre Biometrics   Height 5' 6.6" (1.692 m)   Weight 174 lb 11.2 oz (79.243 kg)   Waist Circumference 35 inches   Hip Circumference 42.5 inches   Waist to Hip Ratio 0.82 %   BMI (Calculated) 27.7       Nutrition Therapy Plan and Nutrition Goals:   Nutrition Discharge: Rate Your Plate Scores:   Nutrition Goals Re-Evaluation:     Nutrition Goals Re-Evaluation      11/17/14 1700 12/08/14 1624         Personal Goal #1 Re-Evaluation   Personal Goal #1 Wants to lose a few lbs so tries to eat healthier.  Eating healthier but wants to meet with the dietician individually. Her husband died 2 years ago and she doesn't cook much.       Goal Progress Seen Yes Yes         Psychosocial: Target Goals: Acknowledge presence or absence of depression, maximize coping skills, provide positive support system. Participant is able to verbalize types and ability to use techniques and skills needed for reducing stress and depression.  Initial Review & Psychosocial Screening:     Initial Psych Review & Screening - 11/02/14 [place]      Quality of Life Scores:     Quality of Life - 10/28/14 0917    Quality of Life Scores   Health/Function Pre 15.81 %   Socioeconomic Pre 25.21 %   Psych/Spiritual Pre 25.43 %   Family Pre 25.13 %   GLOBAL Pre 21.31 %      PHQ-9:     Recent Review Flowsheet Data    There is no flowsheet data to display.      Psychosocial Evaluation and Intervention:     Psychosocial Evaluation - 11/02/14 1643    Psychosocial Evaluation & Interventions   Interventions Stress management education;Relaxation education;Encouraged to exercise with the program and follow exercise prescription   Comments Counselor met with Ms. SkEarlywineoday for initial psychosocial evaluation.  She is a [age]ear old who recently had mitral valve replacement.  She reports having a strong support system with an  adult daughter who lives with her since Ms. S's spouse died 5 years ago, and a son who lives close by.  Ms. S Chauncey Cruellso is actively involved in her local church.  She reports difficulty sleeping with her heart racing when she goes to bed.  Then her sleep is interrupted after 2-3 hours.  Counselor recommended speaking with a pharmacist about natural melatonin sustained release since Ms. S was hesitant to speak with Dr., for fear of getting "dependent" on another medication.  Ms. S Chauncey Cruelas accompanied to Cardiac Rehab today by her adult son, who is very concerned about his mother.  Ms. S Chauncey Cruelenies a history of depression or current symptoms.  However, there is a concern that she may be experiencing symptoms of anxiety with the reports of a "racing heart" at times.  Counselor will continue to follow with Ms. S.  Continued Psychosocial Services Needed Yes  Ms. S will benefit from attending the psychoeducational components of this program as well as consistent exercise.  She also needs to address her sleep issues and "racing heart."      Psychosocial Re-Evaluation:   Vocational Rehabilitation: Provide vocational rehab assistance to qualifying candidates.   Vocational Rehab Evaluation & Intervention:     Vocational Rehab - 10/27/14 0914    Initial Vocational Rehab Evaluation & Intervention   Assessment shows need for Vocational Rehabilitation No      Education: Education Goals: Education classes will be provided on a weekly basis, covering required topics. Participant will state understanding/return demonstration of topics presented.  Learning Barriers/Preferences:     Learning Barriers/Preferences - 11/20/14 0914    Learning Barriers/Preferences   Learning Barriers None   Learning Preferences None      Education Topics: General Nutrition Guidelines/Fats and Fiber: -Group instruction provided by verbal, written material, models and posters to present the general guidelines for heart healthy  nutrition. Gives an explanation and review of dietary fats and fiber.   Controlling Sodium/Reading Food Labels: -Group verbal and written material supporting the discussion of sodium use in heart healthy nutrition. Review and explanation with models, verbal and written materials for utilization of the food label.   Exercise Physiology & Risk Factors: - Group verbal and written instruction with models to review the exercise physiology of the cardiovascular system and associated critical values. Details cardiovascular disease risk factors and the goals associated with each risk factor.   Aerobic Exercise & Resistance Training: - Gives group verbal and written discussion on the health impact of inactivity. On the components of aerobic and resistive training programs and the benefits of this training and how to safely progress through these programs.   Flexibility, Balance, General Exercise Guidelines: - Provides group verbal and written instruction on the benefits of flexibility and balance training programs. Provides general exercise guidelines with specific guidelines to those with heart or lung disease. Demonstration and skill practice provided.   Stress Management: - Provides group verbal and written instruction about the health risks of elevated stress, cause of high stress, and healthy ways to reduce stress.   Depression: - Provides group verbal and written instruction on the correlation between heart/lung disease and depressed mood, treatment options, and the stigmas associated with seeking treatment.   Anatomy & Physiology of the Heart: - Group verbal and written instruction and models provide basic cardiac anatomy and physiology, with the coronary electrical and arterial systems. Review of: AMI, Angina, Valve disease, Heart Failure, Cardiac Arrhythmia, Pacemakers, and the ICD.   Cardiac Procedures: - Group verbal and written instruction and models to describe the testing methods  done to diagnose heart disease. Reviews the outcomes of the test results. Describes the treatment choices: Medical Management, Angioplasty, or Coronary Bypass Surgery.   Cardiac Medications: - Group verbal and written instruction to review commonly prescribed medications for heart disease. Reviews the medication, class of the drug, and side effects. Includes the steps to properly store meds and maintain the prescription regimen.   Go Sex-Intimacy & Heart Disease, Get SMART - Goal Setting: - Group verbal and written instruction through game format to discuss heart disease and the return to sexual intimacy. Provides group verbal and written material to discuss and apply goal setting through the application of the S.M.A.R.T. Method.   Other Matters of the Heart: - Provides group verbal, written materials and models to describe Heart Failure, Angina, Valve Disease, and Diabetes  in the realm of heart disease. Includes description of the disease process and treatment options available to the cardiac patient.   Exercise & Equipment Safety: - Individual verbal instruction and demonstration of equipment use and safety with use of the equipment.   Infection Prevention: - Provides verbal and written material to individual with discussion of infection control including proper hand washing and proper equipment cleaning during exercise session.   Falls Prevention: - Provides verbal and written material to individual with discussion of falls prevention and safety.   Diabetes: - Individual verbal and written instruction to review signs/symptoms of diabetes, desired ranges of glucose level fasting, after meals and with exercise. Advice that pre and post exercise glucose checks will be done for 3 sessions at entry of program.    Knowledge Questionnaire Score:     Knowledge Questionnaire Score - 10/27/14 0914    Knowledge Questionnaire Score   Pre Score 22/28      Personal Goals and Risk  Factors at Admission:     Personal Goals and Risk Factors at Admission - 11/20/14 0916    Personal Goals and Risk Factors on Admission   Increase Aerobic Exercise and Physical Activity Yes   Intervention While in program, learn and follow the exercise prescription taught. Start at a low level workload and increase workload after able to maintain previous level for 30 minutes. Increase time before increasing intensity.   Diabetes No   Hypertension No   Lipids Yes   Goal Cholesterol controlled with medications as prescribed, with individualized exercise RX and with personalized nutrition plan. Value goals: LDL < 107m, HDL > 419m Participant states understanding of desired cholesterol values and following prescriptions.   Intervention Provide nutrition & aerobic exercise along with prescribed medications to achieve LDL <701mHDL >71m35m Stress No      Personal Goals and Risk Factors Review:      Goals and Risk Factor Review      11/17/14 1700 11/30/14 1743 12/08/14 1624       Increase Aerobic Exercise and Physical Activity   Goals Progress/Improvement seen  Yes Yes Yes     Comments Has increased her watts on the NUstep by increasing her exercise. Said she feels better usually after she exercises. [place] CLASS ON Monday and WEdnesdays         Personal Goals Discharge:     Comments: 30 day review. Continue with ITP.

## 2014-12-22 DIAGNOSIS — Z954 Presence of other heart-valve replacement: Secondary | ICD-10-CM | POA: Diagnosis not present

## 2014-12-22 DIAGNOSIS — Z952 Presence of prosthetic heart valve: Secondary | ICD-10-CM

## 2014-12-22 NOTE — Progress Notes (Signed)
Daily Session Note  Patient Details  Name: MADDISON KILNER MRN: [id number] Date of Birth: 1934-11-08 Referring Provider:  Di Kindle, MD  Encounter Date: 12/22/2014  Check In:     Session Check In - 12/22/14 1626    Check-In   Staff Present Lestine Box BS, ACSM EP-C, Exercise Physiologist;Carroll Enterkin RN, BSN;Diane Joya Gaskins RN, BSN   ER physicians immediately available to respond to emergencies See telemetry face sheet for immediately available ER MD   Medication changes reported     No   Fall or balance concerns reported    No   Warm-up and Cool-down Performed on first and last piece of equipment   VAD Patient? No   Pain Assessment   Currently in Pain? No/denies         Goals Met:  Proper associated with RPD/PD & O2 Sat Exercise tolerated well No report of cardiac concerns or symptoms Strength training completed today  Goals Unmet:  Not Applicable  Goals Comments:    Dr. [name] is Medical Director for [facility].

## 2014-12-26 DIAGNOSIS — Z952 Presence of prosthetic heart valve: Secondary | ICD-10-CM

## 2014-12-26 DIAGNOSIS — Z954 Presence of other heart-valve replacement: Secondary | ICD-10-CM | POA: Diagnosis not present

## 2014-12-26 NOTE — Progress Notes (Signed)
Daily Session Note  Patient Details  Name: [name] MRN: [id number] Date of Birth: [birth year] Referring Provider:  Di Kindle, MD  Encounter Date: 12/26/2014  Check In:     Session Check In - 12/26/14 1619    Check-In   Staff Present Heath Lark RN, BSN, CCRP;Novali Vollman BS, ACSM EP-C, Exercise Physiologist;Carroll Enterkin RN, BSN   ER physicians immediately available to respond to emergencies See telemetry face sheet for immediately available ER MD   Medication changes reported     No   Fall or balance concerns reported    No   Warm-up and Cool-down Performed on first and last piece of equipment   VAD Patient? No   Pain Assessment   Currently in Pain? No/denies         Goals Met:  Proper associated with RPD/PD & O2 Sat Exercise tolerated well No report of cardiac concerns or symptoms Strength training completed today  Goals Unmet:  Not Applicable  Goals Comments:    Dr. [name] is Medical Director for [facility].

## 2014-12-28 ENCOUNTER — Telehealth: Payer: Self-pay

## 2014-12-28 NOTE — Telephone Encounter (Signed)
Savonnah will not be in class the rest of the week due to knee pain. She is seeing her MD tomorrow (12/29/14) and hopes to return next week.

## 2015-01-04 ENCOUNTER — Encounter: Payer: Medicare Other | Attending: Surgery | Admitting: *Deleted

## 2015-01-04 DIAGNOSIS — Z952 Presence of prosthetic heart valve: Secondary | ICD-10-CM

## 2015-01-04 DIAGNOSIS — Z954 Presence of other heart-valve replacement: Secondary | ICD-10-CM | POA: Diagnosis present

## 2015-01-04 NOTE — Progress Notes (Signed)
Daily Session Note  Patient Details  Name: NAELANI LAFRANCE MRN: [id number] Date of Birth: 1935-01-17 Referring Provider:  Di Kindle, MD  Encounter Date: 01/04/2015  Check In:     Session Check In - 01/04/15 1724    Check-In   Staff Present Heath Lark RN, BSN, CCRP;Chia Rock RN, Drusilla Kanner MS, ACSM CEP Exercise Physiologist   ER physicians immediately available to respond to emergencies See telemetry face sheet for immediately available ER MD   Medication changes reported     No   Fall or balance concerns reported    No   Warm-up and Cool-down Performed on first and last piece of equipment   VAD Patient? No   Pain Assessment   Currently in Pain? No/denies         Goals Met:  Proper associated with RPD/PD & O2 Sat Exercise tolerated well  Goals Unmet:  Not Applicable  Goals Comments:    Dr. [name] is Medical Director for [facility].

## 2015-01-05 DIAGNOSIS — Z954 Presence of other heart-valve replacement: Secondary | ICD-10-CM | POA: Diagnosis not present

## 2015-01-05 DIAGNOSIS — Z952 Presence of prosthetic heart valve: Secondary | ICD-10-CM

## 2015-01-05 NOTE — Patient Instructions (Signed)
Discharge Instructions  Patient Details  Name: [name] MRN: [id number] Date of Birth: 06-Feb-1935 Referring Provider:  Di Kindle, MD   Number of Visits: 36  Reason for Discharge:  Patient reached a stable level of exercise. Patient independent in their exercise.  Smoking History:  History  Smoking status  . Not on file  Smokeless tobacco  . Not on file    Diagnosis:  S/P MVR (mitral valve replacement) - Plan: CARDIAC REHAB 30 DAY REVIEW  Initial Exercise Prescription:   Discharge Exercise Prescription (Final Exercise Prescription Changes):     Exercise Prescription Changes - 12/22/14 1600    Exercise Review   Progression Yes   Response to Exercise   Blood Pressure (Admit) 114/72 mmHg   Blood Pressure (Exercise) 138/80 mmHg   Blood Pressure (Exit) 110/60 mmHg   Heart Rate (Admit) 98 bpm   Heart Rate (Exercise) 107 bpm   Heart Rate (Exit) 100 bpm   Frequency Add 1 additional day to program exercise sessions.   Duration Progress to 30 minutes of continuous aerobic without signs/symptoms of physical distress   Intensity THRR unchanged   Progression Continue progressive overload as per policy without signs/symptoms or physical distress.   Resistance Training   Training Prescription Yes   Weight 2   Reps 10-15   Treadmill   MPH 2   Grade 0   Minutes 20   NuStep   Level 5   Watts 55   Minutes 25   REL-XR   Level 4   Watts 50   Minutes 15      Functional Capacity:     6 Minute Walk      10/28/14 0915 12/21/14 1648     6 Minute Walk   Phase Initial Discharge    Distance 730 feet 1118 feet    Walk Time 6 minutes 6 minutes    Resting HR 75 bpm 85 bpm    Resting BP 124/60 mmHg 118/60 mmHg    Max Ex. HR  113 bpm    Max Ex. BP 110/64 mmHg 142/60 mmHg    RPE 15 13    Symptoms No        Quality of Life:     Quality of Life - 01/05/15 1635    Quality of Life Scores   Health/Function Pre 15.81 %   Health/Function Post 24.64 %   Health/Function % Change 55 %   Socioeconomic Pre 25.21 %   Socioeconomic Post 25.58 %   Socioeconomic % Change 1 %   Psych/Spiritual Pre 25.43 %   Psych/Spiritual Post 20.79 %   Psych/Spiritual % Change -18 %   Family Pre 25.13 %   Family Post 30 %   Family % Change 19 %   GLOBAL Pre 21.31 %   GLOBAL Post 24.65 %   GLOBAL % Change 15 %      Personal Goals: Goals established at orientation with interventions provided to work toward goal.     Personal Goals and Risk Factors at Admission - 11/20/14 0916    Personal Goals and Risk Factors on Admission   Increase Aerobic Exercise and Physical Activity Yes   Intervention While in program, learn and follow the exercise prescription taught. Start at a low level workload and increase workload after able to maintain previous level for 30 minutes. Increase time before increasing intensity.   Diabetes No   Hypertension No   Lipids Yes   Goal Cholesterol controlled with medications as prescribed,  with individualized exercise RX and with personalized nutrition plan. Value goals: LDL < 70mg , HDL > 40mg . Participant states understanding of desired cholesterol values and following prescriptions.   Intervention Provide nutrition & aerobic exercise along with prescribed medications to achieve LDL 70mg , HDL >40mg .   Stress No       Personal Goals Discharge:     Personal Goals at Discharge - 01/05/15 1639    Increase Aerobic Exercise and Physical Activity   Goals Progress/Improvement seen  Yes   Comments Zyah is glad that she is not short of breath any more.      Nutrition & Weight - Outcomes:     Pre Biometrics - 10/28/14 0916    Pre Biometrics   Height 5' 6.6" (1.692 m)   Weight 174 lb 11.2 oz (79.243 kg)   Waist Circumference 35 inches   Hip Circumference 42.5 inches   Waist to Hip Ratio 0.82 %   BMI (Calculated) 27.7         Post Biometrics - 12/21/14 1649     Post  Biometrics   Height 5' 6.6" (1.692 m)   Weight 164  lb (74.39 kg)   Waist Circumference 33.5 inches   Hip Circumference 41 inches   Waist to Hip Ratio 0.82 %   BMI (Calculated) 26      Nutrition:     Nutrition Therapy & Goals - 12/21/14 1727    Nutrition Therapy   Diet basic heart healthy, low sodium   Drug/Food Interactions Coumadin/Vit K   Protein 6 ounces/day   Fruits and Vegetables 5 servings/day   Personal Nutrition Goals   Personal Goal #1 Check with MD about using a salt substitute, or use Mrs. Dash salt-free seasonings.   Personal Goal #2 Include a vegetable and/or a fruit with each meal, using mostly fresh, frozen, or unsalted canned vegetables. Eat generous portions of these foods.      Nutrition Discharge:     Rate Your Plate - [id number]    Rate Your Plate Scores   Pre Score 49   Pre Score % 54 %   Post Score 61   Post Score % 67 %   % Change 13 %      Education Questionnaire Score:     Knowledge Questionnaire Score - 01/05/15 1634    Knowledge Questionnaire Score   Pre Score 22/28   Post Score 26/28    Candence - You are amazing with all you have accomplished since your first session at [facility].  Keep up the great work.   Goals reviewed with patient; copy given to patient.

## 2015-01-05 NOTE — Progress Notes (Signed)
Discharge Summary  Patient Details  Name: [name] MRN: [id number] Date of Birth: [birth year] Referring Provider:  Di Kindle, MD   Number of Visits: 36  Reason for Discharge:  Patient reached a stable level of exercise. Patient independent in their exercise.  Smoking History:  History  Smoking status  . Not on file  Smokeless tobacco  . Not on file    Diagnosis:  No diagnosis found.  ADL UCSD:   Initial Exercise Prescription:   Discharge Exercise Prescription (Final Exercise Prescription Changes):     Exercise Prescription Changes - 12/22/14 1600    Exercise Review   Progression Yes   Response to Exercise   Blood Pressure (Admit) 114/72 mmHg   Blood Pressure (Exercise) 138/80 mmHg   Blood Pressure (Exit) 110/60 mmHg   Heart Rate (Admit) 98 bpm   Heart Rate (Exercise) 107 bpm   Heart Rate (Exit) 100 bpm   Frequency Add 1 additional day to program exercise sessions.   Duration Progress to 30 minutes of continuous aerobic without signs/symptoms of physical distress   Intensity THRR unchanged   Progression Continue progressive overload as per policy without signs/symptoms or physical distress.   Resistance Training   Training Prescription Yes   Weight 2   Reps 10-15   Treadmill   MPH 2   Grade 0   Minutes 20   NuStep   Level 5   Watts 55   Minutes 25   REL-XR   Level 4   Watts 50   Minutes 15      Functional Capacity:     6 Minute Walk      10/28/14 0915 12/21/14 1648     6 Minute Walk   Phase Initial Discharge    Distance 730 feet 1118 feet    Walk Time 6 minutes 6 minutes    Resting HR 75 bpm 85 bpm    Resting BP 124/60 mmHg 118/60 mmHg    Max Ex. HR  113 bpm    Max Ex. BP 110/64 mmHg 142/60 mmHg    RPE 15 13    Symptoms No        Psychological, QOL, Others - Outcomes: PHQ 2/9: Depression screen PHQ 2/9 01/05/2015  Decreased Interest 0  Down, Depressed, Hopeless 0  PHQ - 2 Score 0  Altered sleeping 1  Tired,  decreased energy 1  Change in appetite 0  Feeling bad or failure about yourself  0  Trouble concentrating 0  Moving slowly or fidgety/restless 0  Suicidal thoughts 0  PHQ-9 Score 2    Quality of Life:     Quality of Life - 01/05/15 1635    Quality of Life Scores   Health/Function Pre 15.81 %   Health/Function Post 24.64 %   Health/Function % Change 55 %   Socioeconomic Pre 25.21 %   Socioeconomic Post 25.58 %   Socioeconomic % Change 1 %   Psych/Spiritual Pre 25.43 %   Psych/Spiritual Post 20.79 %   Psych/Spiritual % Change -18 %   Family Pre 25.13 %   Family Post 30 %   Family % Change 19 %   GLOBAL Pre 21.31 %   GLOBAL Post 24.65 %   GLOBAL % Change 15 %      Personal Goals: Goals established at orientation with interventions provided to work toward goal.     Personal Goals and Risk Factors at Admission - 11/20/14 0916    Personal Goals and Risk Factors on  Admission   Increase Aerobic Exercise and Physical Activity Yes   Intervention While in program, learn and follow the exercise prescription taught. Start at a low level workload and increase workload after able to maintain previous level for 30 minutes. Increase time before increasing intensity.   Diabetes No   Hypertension No   Lipids Yes   Goal Cholesterol controlled with medications as prescribed, with individualized exercise RX and with personalized nutrition plan. Value goals: LDL < 70mg , HDL > 40mg . Participant states understanding of desired cholesterol values and following prescriptions.   Intervention Provide nutrition & aerobic exercise along with prescribed medications to achieve LDL 70mg , HDL >40mg .   Stress No       Personal Goals Discharge:     Personal Goals at Discharge - 01/05/15 1639    Increase Aerobic Exercise and Physical Activity   Goals Progress/Improvement seen  Yes   Comments [name] is glad that she is not short of breath any more.      Nutrition & Weight - Outcomes:      Pre Biometrics - 10/28/14 0916    Pre Biometrics   Height 5' 6.6" (1.692 m)   Weight 174 lb 11.2 oz (79.243 kg)   Waist Circumference 35 inches   Hip Circumference 42.5 inches   Waist to Hip Ratio 0.82 %   BMI (Calculated) 27.7         Post Biometrics - 12/21/14 1649     Post  Biometrics   Height 5' 6.6" (1.692 m)   Weight 164 lb (74.39 kg)   Waist Circumference 33.5 inches   Hip Circumference 41 inches   Waist to Hip Ratio 0.82 %   BMI (Calculated) 26      Nutrition:     Nutrition Therapy & Goals - 12/21/14 1727    Nutrition Therapy   Diet basic heart healthy, low sodium   Drug/Food Interactions Coumadin/Vit K   Protein 6 ounces/day   Fruits and Vegetables 5 servings/day   Personal Nutrition Goals   Personal Goal #1 Check with MD about using a salt substitute, or use Mrs. Dash salt-free seasonings.   Personal Goal #2 Include a vegetable and/or a fruit with each meal, using mostly fresh, frozen, or unsalted canned vegetables. Eat generous portions of these foods.      Nutrition Discharge:     Rate Your Plate - [id number]    Rate Your Plate Scores   Pre Score 49   Pre Score % 54 %   Post Score 61   Post Score % 67 %   % Change 13 %      Education Questionnaire Score:     Knowledge Questionnaire Score - 01/05/15 1634    Knowledge Questionnaire Score   Pre Score 22/28   Post Score 26/28      Goals reviewed with patient; copy given to patient.

## 2015-01-05 NOTE — Progress Notes (Signed)
Cardiac Individual Treatment Plan  Patient Details  Name: [name] MRN: [id number] Date of Birth: [birth year] Referring Provider:  Di Kindle, MD  Initial Encounter Date:    Visit Diagnosis: MVR  Patient's Home Medications on Admission:  Current outpatient prescriptions:  .  acetaminophen (TYLENOL) 325 MG tablet, Take by mouth., Disp: , Rfl:  .  amiodarone (PACERONE) 200 MG tablet, TAKE 1 TABLET EVERY DAY, Disp: , Rfl:  .  aspirin EC 81 MG tablet, Take by mouth., Disp: , Rfl:  .  furosemide (LASIX) 40 MG tablet, Take by mouth. 1/2 tablet 2 times a day or 1 tablet 2 times a day, Disp: , Rfl:  .  Honey 5 GM/5ML SYRP, Take by mouth., Disp: , Rfl:  .  metoprolol (LOPRESSOR) 50 MG tablet, Take by mouth., Disp: , Rfl:  .  Multiple Vitamin (MULTI-VITAMINS) TABS, Take by mouth., Disp: , Rfl:  .  pantoprazole (PROTONIX) 40 MG tablet, Take by mouth., Disp: , Rfl:  .  spironolactone (ALDACTONE) 25 MG tablet, Take 1 tablet by mouth 2 (two) times daily., Disp: , Rfl:  .  vitamin C (ASCORBIC ACID) 500 MG tablet, Take by mouth., Disp: , Rfl:  .  warfarin (COUMADIN) 5 MG tablet, Take by mouth., Disp: , Rfl:   Past Medical History: No past medical history on file.  Tobacco Use: History  Smoking status  . Not on file  Smokeless tobacco  . Not on file    Labs: Recent Review Flowsheet Data    There is no flowsheet data to display.       Exercise Target Goals:    Exercise Program Goal: Individual exercise prescription set with THRR, safety & activity barriers. Participant demonstrates ability to understand and report RPE using BORG scale, to self-measure pulse accurately, and to acknowledge the importance of the exercise prescription.  Exercise Prescription Goal: Starting with aerobic activity 30 plus minutes a day, 3 days per week for initial exercise prescription. Provide home exercise prescription and guidelines that participant acknowledges understanding prior to  discharge.  Activity Barriers & Risk Stratification:     Activity Barriers & Risk Stratification - 11/20/14 0913    Activity Barriers & Risk Stratification   Activity Barriers Arthritis;Joint Problems   Risk Stratification High      6 Minute Walk:     6 Minute Walk      10/28/14 0915 12/21/14 1648     6 Minute Walk   Phase Initial Discharge    Distance 730 feet 1118 feet    Walk Time 6 minutes 6 minutes    Resting HR 75 bpm 85 bpm    Resting BP 124/60 mmHg 118/60 mmHg    Max Ex. HR  113 bpm    Max Ex. BP 110/64 mmHg 142/60 mmHg    RPE 15 13    Symptoms No        Initial Exercise Prescription:   Exercise Prescription Changes:     Exercise Prescription Changes      11/09/14 1600 11/16/14 1800 11/21/14 1200 11/30/14 1600 12/07/14 1700   Exercise Review   Progression Yes  Yes  Yes   Response to Exercise   Blood Pressure (Admit)   116/72 mmHg     Blood Pressure (Exercise)   128/74 mmHg     Blood Pressure (Exit)   110/60 mmHg     Heart Rate (Admit)   94 bpm     Heart Rate (Exercise)   110 bpm  Heart Rate (Exit)   75 bpm     Rating of Perceived Exertion (Exercise)   12     Duration   Progress to 30 minutes of continuous aerobic without signs/symptoms of physical distress     Intensity   THRR unchanged     Progression   Continue progressive overload as per policy without signs/symptoms or physical distress.     Treadmill   MPH    1.2 1.8   Grade    0 0   Minutes    15    NuStep   Level  _0 Watts  40 40 45 50   Minutes   25 25    REL-XR   Level _1 Watts 40  50 50 50   Minutes _2 12/14/14 1700 12/20/14 1700 12/22/14 1600       Exercise Review   Progression Yes Yes Yes     Response to Exercise   Blood Pressure (Admit)  114/72 mmHg 114/72 mmHg     Blood Pressure (Exercise)  138/80 mmHg 138/80 mmHg     Blood Pressure (Exit)  110/60 mmHg 110/60 mmHg     Heart Rate (Admit)  98 bpm 98 bpm     Heart Rate (Exercise)  107 bpm 107  bpm     Heart Rate (Exit)  100 bpm 100 bpm     Frequency Add 1 additional day to program exercise sessions. Add 1 additional day to program exercise sessions. Add 1 additional day to program exercise sessions.     Duration Progress to 30 minutes of continuous aerobic without signs/symptoms of physical distress Progress to 30 minutes of continuous aerobic without signs/symptoms of physical distress Progress to 30 minutes of continuous aerobic without signs/symptoms of physical distress     Intensity THRR unchanged THRR unchanged THRR unchanged     Progression Continue progressive overload as per policy without signs/symptoms or physical distress. Continue progressive overload as per policy without signs/symptoms or physical distress. Continue progressive overload as per policy without signs/symptoms or physical distress.     Resistance Training   Training Prescription Yes Yes Yes     Weight _3 Reps 10-15 10-15 10-15     Treadmill   MPH 1._4 Grade 0 0 0     Minutes _5 NuStep   Level _6 Watts 50 55 55     Minutes _7 REL-XR   Level _8 Watts 50 50 50     Minutes _9 Discharge Exercise Prescription (Final Exercise Prescription Changes):     Exercise Prescription Changes - 12/22/14 1600    Exercise Review   Progression Yes   Response to Exercise   Blood Pressure (Admit) 114/72 mmHg   Blood Pressure (Exercise) 138/80 mmHg   Blood Pressure (Exit) 110/60 mmHg   Heart Rate (Admit) 98 bpm   Heart Rate (Exercise) 107 bpm   Heart Rate (Exit) 100 bpm   Frequency Add 1 additional day to program exercise sessions.   Duration Progress to 30 minutes of continuous aerobic without signs/symptoms of physical distress   Intensity THRR unchanged   Progression Continue progressive overload as per policy without  signs/symptoms or physical distress.   Resistance Training   Training Prescription Yes   Weight 2   Reps 10-15   Treadmill    MPH 2   Grade 0   Minutes 20   NuStep   Level 5   Watts 55   Minutes 25   REL-XR   Level 4   Watts 50   Minutes 15      Nutrition:  Target Goals: Understanding of nutrition guidelines, daily intake of sodium <1581m, cholesterol <2026m calories 30% from fat and 7% or less from saturated fats, daily to have 5 or more servings of fruits and vegetables.  Biometrics:     Pre Biometrics - 10/28/14 0916    Pre Biometrics   Height 5' 6.6" (1.692 m)   Weight 174 lb 11.2 oz (79.243 kg)   Waist Circumference 35 inches   Hip Circumference 42.5 inches   Waist to Hip Ratio 0.82 %   BMI (Calculated) 27.7         Post Biometrics - 12/21/14 1649     Post  Biometrics   Height 5' 6.6" (1.692 m)   Weight 164 lb (74.39 kg)   Waist Circumference 33.5 inches   Hip Circumference 41 inches   Waist to Hip Ratio 0.82 %   BMI (Calculated) 26      Nutrition Therapy Plan and Nutrition Goals:     Nutrition Therapy & Goals - 12/21/14 1727    Nutrition Therapy   Diet basic heart healthy, low sodium   Drug/Food Interactions Coumadin/Vit K   Protein 6 ounces/day   Fruits and Vegetables 5 servings/day   Personal Nutrition Goals   Personal Goal #1 Check with MD about using a salt substitute, or use Mrs. Dash salt-free seasonings.   Personal Goal #2 Include a vegetable and/or a fruit with each meal, using mostly fresh, frozen, or unsalted canned vegetables. Eat generous portions of these foods.      Nutrition Discharge: Rate Your Plate Scores:     Rate Your Plate - 0787/86/7667209  Rate Your Plate Scores   Pre Score 49   Pre Score % 54 %   Post Score 61   Post Score % 67 %   % Change 13 %      Nutrition Goals Re-Evaluation:     Nutrition Goals Re-Evaluation      11/17/14 1700 12/08/14 1624 01/04/15 1724       Personal Goal #1 Re-Evaluation   Personal Goal #1 Wants to lose a few lbs so tries to eat healthier.  Eating healthier but wants to meet with the dietician  individually. Her husband died 2 years ago and she doesn't cook much.  Uses less salt     Goal Progress Seen Yes Yes Yes        Psychosocial: Target Goals: Acknowledge presence or absence of depression, maximize coping skills, provide positive support system. Participant is able to verbalize types and ability to use techniques and skills needed for reducing stress and depression.  Initial Review & Psychosocial Screening:     Initial Psych Review & Screening - 11/02/14 [place]      Quality of Life Scores:     Quality of Life - 01/05/15 1635    Quality of Life Scores   Health/Function Pre 15.81 %   Health/Function Post 24.64 %   Health/Function % Change 55 %   Socioeconomic Pre 25.21 %  Socioeconomic Post 25.58 %   Socioeconomic % Change 1 %   Psych/Spiritual Pre 25.43 %   Psych/Spiritual Post 20.79 %   Psych/Spiritual % Change -18 %   Family Pre 25.13 %   Family Post 30 %   Family % Change 19 %   GLOBAL Pre 21.31 %   GLOBAL Post 24.65 %   GLOBAL % Change 15 %      PHQ-9:     Recent Review Flowsheet Data    Depression screen [facility] 2/9 01/05/2015   Decreased Interest 0   Down, Depressed, Hopeless 0   PHQ - 2 Score 0   Altered sleeping 1   Tired, decreased energy 1   Change in appetite 0   Feeling bad or failure about yourself  0   Trouble concentrating 0   Moving slowly or fidgety/restless 0   Suicidal thoughts 0   PHQ-9 Score 2      Psychosocial Evaluation and Intervention:     Psychosocial Evaluation - 11/02/14 1643    Psychosocial Evaluation & Interventions   Interventions Stress management education;Relaxation education;Encouraged to exercise with the program and follow exercise prescription   Comments Counselor met with Ms. Regan today for initial psychosocial evaluation.  She is a 80 year old who recently had mitral valve replacement.  She reports having a strong support system with an adult daughter who  lives with her since Ms. S's spouse died 5 years ago, and a son who lives close by.  Ms. Chauncey Cruel also is actively involved in her local church.  She reports difficulty sleeping with her heart racing when she goes to bed.  Then her sleep is interrupted after 2-3 hours.  Counselor recommended speaking with a pharmacist about natural melatonin sustained release since Ms. S was hesitant to speak with Dr., for fear of getting "dependent" on another medication.  Ms. Chauncey Cruel was accompanied to Cardiac Rehab today by her adult son, who is very concerned about his mother.  Ms. Chauncey Cruel denies a history of depression or current symptoms.  However, there is a concern that she may be experiencing symptoms of anxiety with the reports of a "racing heart" at times.  Counselor will continue to follow with Ms. Chauncey Cruel.     Continued Psychosocial Services Needed Yes  Ms. S will benefit from attending the psychoeducational components of this program as well as consistent exercise.  She also needs to address her sleep issues and "racing heart."      Psychosocial Re-Evaluation:   Vocational Rehabilitation: Provide vocational rehab assistance to qualifying candidates.   Vocational Rehab Evaluation & Intervention:     Vocational Rehab - 10/27/14 0914    Initial Vocational Rehab Evaluation & Intervention   Assessment shows need for Vocational Rehabilitation No      Education: Education Goals: Education classes will be provided on a weekly basis, covering required topics. Participant will state understanding/return demonstration of topics presented.  Learning Barriers/Preferences:     Learning Barriers/Preferences - 11/20/14 0914    Learning Barriers/Preferences   Learning Barriers None   Learning Preferences None      Education Topics: General Nutrition Guidelines/Fats and Fiber: -Group instruction provided by verbal, written material, models and posters to present the general guidelines for heart healthy nutrition. Gives an  explanation and review of dietary fats and fiber.   Controlling Sodium/Reading Food Labels: -Group verbal and written material supporting the discussion of sodium use in heart healthy nutrition. Review and explanation with models, verbal and written materials  for utilization of the food label.   Exercise Physiology & Risk Factors: - Group verbal and written instruction with models to review the exercise physiology of the cardiovascular system and associated critical values. Details cardiovascular disease risk factors and the goals associated with each risk factor.   Aerobic Exercise & Resistance Training: - Gives group verbal and written discussion on the health impact of inactivity. On the components of aerobic and resistive training programs and the benefits of this training and how to safely progress through these programs.   Flexibility, Balance, General Exercise Guidelines: - Provides group verbal and written instruction on the benefits of flexibility and balance training programs. Provides general exercise guidelines with specific guidelines to those with heart or lung disease. Demonstration and skill practice provided.   Stress Management: - Provides group verbal and written instruction about the health risks of elevated stress, cause of high stress, and healthy ways to reduce stress.   Depression: - Provides group verbal and written instruction on the correlation between heart/lung disease and depressed mood, treatment options, and the stigmas associated with seeking treatment.   Anatomy & Physiology of the Heart: - Group verbal and written instruction and models provide basic cardiac anatomy and physiology, with the coronary electrical and arterial systems. Review of: AMI, Angina, Valve disease, Heart Failure, Cardiac Arrhythmia, Pacemakers, and the ICD.   Cardiac Procedures: - Group verbal and written instruction and models to describe the testing methods done to diagnose  heart disease. Reviews the outcomes of the test results. Describes the treatment choices: Medical Management, Angioplasty, or Coronary Bypass Surgery.   Cardiac Medications: - Group verbal and written instruction to review commonly prescribed medications for heart disease. Reviews the medication, class of the drug, and side effects. Includes the steps to properly store meds and maintain the prescription regimen.   Go Sex-Intimacy & Heart Disease, Get SMART - Goal Setting: - Group verbal and written instruction through game format to discuss heart disease and the return to sexual intimacy. Provides group verbal and written material to discuss and apply goal setting through the application of the S.M.A.R.T. Method.   Other Matters of the Heart: - Provides group verbal, written materials and models to describe Heart Failure, Angina, Valve Disease, and Diabetes in the realm of heart disease. Includes description of the disease process and treatment options available to the cardiac patient.   Exercise & Equipment Safety: - Individual verbal instruction and demonstration of equipment use and safety with use of the equipment.   Infection Prevention: - Provides verbal and written material to individual with discussion of infection control including proper hand washing and proper equipment cleaning during exercise session.   Falls Prevention: - Provides verbal and written material to individual with discussion of falls prevention and safety.   Diabetes: - Individual verbal and written instruction to review signs/symptoms of diabetes, desired ranges of glucose level fasting, after meals and with exercise. Advice that pre and post exercise glucose checks will be done for 3 sessions at entry of program.    Knowledge Questionnaire Score:     Knowledge Questionnaire Score - 01/05/15 1634    Knowledge Questionnaire Score   Pre Score 22/28   Post Score 26/28      Personal Goals and Risk  Factors at Admission:     Personal Goals and Risk Factors at Admission - 11/20/14 0916    Personal Goals and Risk Factors on Admission   Increase Aerobic Exercise and Physical Activity Yes   Intervention While  in program, learn and follow the exercise prescription taught. Start at a low level workload and increase workload after able to maintain previous level for 30 minutes. Increase time before increasing intensity.   Diabetes No   Hypertension No   Lipids Yes   Goal Cholesterol controlled with medications as prescribed, with individualized exercise RX and with personalized nutrition plan. Value goals: LDL < 74m, HDL > 490m Participant states understanding of desired cholesterol values and following prescriptions.   Intervention Provide nutrition & aerobic exercise along with prescribed medications to achieve LDL <7057mHDL >74m47m Stress No      Personal Goals and Risk Factors Review:      Goals and Risk Factor Review      11/17/14 1700 11/30/14 1743 12/08/14 1624 01/04/15 1727     Increase Aerobic Exercise and Physical Activity   Goals Progress/Improvement seen  Yes Yes Yes Yes    Comments Has increased her watts on the NUstep by increasing her exercise. Said she feels better usually after she exercises. Interested in [place] CLASS ON Monday and WEdnesdays  MargKarmelaglad that she is not short of breath any more.       Personal Goals Discharge:      Personal Goals at Discharge - 01/05/15 1639    Increase Aerobic Exercise and Physical Activity   Goals Progress/Improvement seen  Yes   Comments MargIyonaglad that she is not short of breath any more.       Comments: Discharged 01/05/2015

## 2015-02-13 NOTE — OR Nursing (Signed)
Cleared by pcp

## 2015-02-14 DIAGNOSIS — Z952 Presence of prosthetic heart valve: Secondary | ICD-10-CM | POA: Diagnosis not present

## 2015-02-14 DIAGNOSIS — Z7901 Long term (current) use of anticoagulants: Secondary | ICD-10-CM | POA: Diagnosis not present

## 2015-02-14 DIAGNOSIS — K219 Gastro-esophageal reflux disease without esophagitis: Secondary | ICD-10-CM | POA: Diagnosis not present

## 2015-02-14 DIAGNOSIS — Z87891 Personal history of nicotine dependence: Secondary | ICD-10-CM | POA: Diagnosis not present

## 2015-02-14 DIAGNOSIS — Z880 Allergy status to penicillin: Secondary | ICD-10-CM | POA: Diagnosis not present

## 2015-02-14 DIAGNOSIS — M1990 Unspecified osteoarthritis, unspecified site: Secondary | ICD-10-CM | POA: Diagnosis not present

## 2015-02-14 DIAGNOSIS — H2511 Age-related nuclear cataract, right eye: Secondary | ICD-10-CM | POA: Diagnosis not present

## 2015-02-14 DIAGNOSIS — I10 Essential (primary) hypertension: Secondary | ICD-10-CM | POA: Diagnosis not present

## 2015-02-14 DIAGNOSIS — R011 Cardiac murmur, unspecified: Secondary | ICD-10-CM | POA: Diagnosis not present

## 2015-02-14 DIAGNOSIS — Z91048 Other nonmedicinal substance allergy status: Secondary | ICD-10-CM | POA: Diagnosis not present

## 2015-02-20 ENCOUNTER — Encounter: Payer: Self-pay | Admitting: *Deleted

## 2015-02-20 ENCOUNTER — Ambulatory Visit: Payer: Medicare Other | Admitting: Anesthesiology

## 2015-02-20 ENCOUNTER — Ambulatory Visit
Admission: RE | Admit: 2015-02-20 | Discharge: 2015-02-20 | Disposition: A | Discharge status: Home / Self Care | Payer: Medicare Other | Source: Ambulatory Visit | Attending: Ophthalmology | Admitting: Ophthalmology

## 2015-02-20 ENCOUNTER — Encounter
Admission: RE | Disposition: A | Discharge status: Home / Self Care | Payer: Self-pay | Source: Ambulatory Visit | Attending: Ophthalmology

## 2015-02-20 DIAGNOSIS — Z91048 Other nonmedicinal substance allergy status: Secondary | ICD-10-CM | POA: Insufficient documentation

## 2015-02-20 DIAGNOSIS — Z87891 Personal history of nicotine dependence: Secondary | ICD-10-CM | POA: Insufficient documentation

## 2015-02-20 DIAGNOSIS — Z952 Presence of prosthetic heart valve: Secondary | ICD-10-CM | POA: Insufficient documentation

## 2015-02-20 DIAGNOSIS — M1990 Unspecified osteoarthritis, unspecified site: Secondary | ICD-10-CM | POA: Insufficient documentation

## 2015-02-20 DIAGNOSIS — I10 Essential (primary) hypertension: Secondary | ICD-10-CM | POA: Insufficient documentation

## 2015-02-20 DIAGNOSIS — Z7901 Long term (current) use of anticoagulants: Secondary | ICD-10-CM | POA: Insufficient documentation

## 2015-02-20 DIAGNOSIS — R011 Cardiac murmur, unspecified: Secondary | ICD-10-CM | POA: Insufficient documentation

## 2015-02-20 DIAGNOSIS — Z880 Allergy status to penicillin: Secondary | ICD-10-CM | POA: Insufficient documentation

## 2015-02-20 DIAGNOSIS — H2511 Age-related nuclear cataract, right eye: Secondary | ICD-10-CM | POA: Diagnosis not present

## 2015-02-20 DIAGNOSIS — K219 Gastro-esophageal reflux disease without esophagitis: Secondary | ICD-10-CM | POA: Insufficient documentation

## 2015-02-20 HISTORY — PX: CATARACT EXTRACTION W/PHACO: SHX586

## 2015-02-20 HISTORY — DX: Essential (primary) hypertension: I10

## 2015-02-20 HISTORY — DX: Reserved for inherently not codable concepts without codable children: IMO0001

## 2015-02-20 HISTORY — DX: Gastro-esophageal reflux disease without esophagitis: K21.9

## 2015-02-20 HISTORY — DX: Unspecified osteoarthritis, unspecified site: M19.90

## 2015-02-20 LAB — POTASSIUM: Potassium: 3.9 mmol/L

## 2015-02-20 SURGERY — PHACOEMULSIFICATION, CATARACT, WITH IOL INSERTION
Anesthesia: Monitor Anesthesia Care | Laterality: Right | Wound class: Clean

## 2015-02-20 MED ORDER — NA CHONDROIT SULF-NA HYALURON 40-17 MG/ML IO SOLN
INTRAOCULAR | Status: AC
  Filled 2015-02-20: qty 1

## 2015-02-20 MED ORDER — BUPIVACAINE HCL (PF) 0.75 % IJ SOLN
INTRAMUSCULAR | Status: AC
  Filled 2015-02-20: qty 10

## 2015-02-20 MED ORDER — HYALURONIDASE HUMAN 150 UNIT/ML IJ SOLN
INTRAMUSCULAR | Status: AC
  Filled 2015-02-20: qty 1

## 2015-02-20 MED ORDER — LIDOCAINE HCL (PF) 4 % IJ SOLN
INTRAMUSCULAR | Status: DC | PRN
  Administered 2015-02-20: 4 mL via OPHTHALMIC

## 2015-02-20 MED ORDER — MIDAZOLAM HCL 5 MG/5ML IJ SOLN
INTRAMUSCULAR | Status: DC | PRN
  Administered 2015-02-20: 1 mg via INTRAVENOUS

## 2015-02-20 MED ORDER — PHENYLEPHRINE HCL 10 % OP SOLN
OPHTHALMIC | Status: AC
  Administered 2015-02-20: 1 [drp] via OPHTHALMIC
  Filled 2015-02-20: qty 5

## 2015-02-20 MED ORDER — EPINEPHRINE HCL 1 MG/ML IJ SOLN
INTRAMUSCULAR | Status: AC
  Filled 2015-02-20: qty 1

## 2015-02-20 MED ORDER — SODIUM CHLORIDE 0.9 % IV SOLN
INTRAVENOUS | Status: DC | PRN
Start: 2015-02-20 — End: 2015-02-20
  Administered 2015-02-20: 07:00:00 via INTRAVENOUS

## 2015-02-20 MED ORDER — TETRACAINE HCL 0.5 % OP SOLN
OPHTHALMIC | Status: AC
  Filled 2015-02-20: qty 2

## 2015-02-20 MED ORDER — MOXIFLOXACIN HCL 0.5 % OP SOLN
OPHTHALMIC | Status: AC
  Administered 2015-02-20: 1 [drp] via OPHTHALMIC
  Filled 2015-02-20: qty 3

## 2015-02-20 MED ORDER — CYCLOPENTOLATE HCL 2 % OP SOLN
OPHTHALMIC | Status: AC
  Administered 2015-02-20: 1 [drp] via OPHTHALMIC
  Filled 2015-02-20: qty 2

## 2015-02-20 MED ORDER — CYCLOPENTOLATE HCL 2 % OP SOLN
1.0000 [drp] | OPHTHALMIC | Status: DC | PRN
  Administered 2015-02-20 (×4): 1 [drp] via OPHTHALMIC

## 2015-02-20 MED ORDER — ALFENTANIL 500 MCG/ML IJ INJ
INJECTION | INTRAMUSCULAR | Status: DC | PRN
  Administered 2015-02-20: 500 ug via INTRAVENOUS

## 2015-02-20 MED ORDER — METOPROLOL TARTRATE 50 MG PO TABS
ORAL_TABLET | ORAL | Status: AC
  Administered 2015-02-20: 50 mg
  Filled 2015-02-20: qty 1

## 2015-02-20 MED ORDER — PHENYLEPHRINE HCL 10 % OP SOLN
1.0000 [drp] | OPHTHALMIC | Status: DC | PRN
  Administered 2015-02-20 (×4): 1 [drp] via OPHTHALMIC

## 2015-02-20 MED ORDER — LIDOCAINE HCL (PF) 4 % IJ SOLN
INTRAMUSCULAR | Status: AC
  Filled 2015-02-20: qty 5

## 2015-02-20 MED ORDER — CARBACHOL 0.01 % IO SOLN
INTRAOCULAR | Status: DC | PRN
  Administered 2015-02-20: 0.5 mL via INTRAOCULAR

## 2015-02-20 MED ORDER — NA CHONDROIT SULF-NA HYALURON 40-17 MG/ML IO SOLN
INTRAOCULAR | Status: DC | PRN
  Administered 2015-02-20: 1 mL via INTRAOCULAR

## 2015-02-20 MED ORDER — MOXIFLOXACIN HCL 0.5 % OP SOLN
1.0000 [drp] | OPHTHALMIC | Status: DC | PRN
  Administered 2015-02-20 (×3): 1 [drp] via OPHTHALMIC

## 2015-02-20 MED ORDER — MOXIFLOXACIN HCL 0.5 % OP SOLN
OPHTHALMIC | Status: DC | PRN
  Administered 2015-02-20: 1 [drp] via OPHTHALMIC

## 2015-02-20 MED ORDER — CEFUROXIME OPHTHALMIC INJECTION 1 MG/0.1 ML
INJECTION | OPHTHALMIC | Status: AC
  Filled 2015-02-20: qty 0.1

## 2015-02-20 MED ORDER — SODIUM CHLORIDE 0.9 % IV SOLN
INTRAVENOUS | Status: DC
  Administered 2015-02-20: 07:00:00 via INTRAVENOUS

## 2015-02-20 MED ORDER — LIDOCAINE HCL (PF) 1 % IJ SOLN
INTRAOCULAR | Status: DC | PRN
  Administered 2015-02-20: 4 mL via OPHTHALMIC

## 2015-02-20 MED ORDER — EPINEPHRINE HCL 1 MG/ML IJ SOLN
INTRAMUSCULAR | Status: DC | PRN
  Administered 2015-02-20: 200 mL via OPHTHALMIC

## 2015-02-20 MED ORDER — TETRACAINE HCL 0.5 % OP SOLN
OPHTHALMIC | Status: DC | PRN
  Administered 2015-02-20: 2 [drp] via OPHTHALMIC

## 2015-02-20 SURGICAL SUPPLY — 29 items
CANNULA ANT/CHMB 27GA (MISCELLANEOUS) ×3 IMPLANT
CORD BIP STRL DISP 12FT (MISCELLANEOUS) ×3 IMPLANT
CUP MEDICINE 2OZ PLAST GRAD ST (MISCELLANEOUS) ×3 IMPLANT
DRAPE XRAY CASSETTE 23X24 (DRAPES) ×3 IMPLANT
ERASER HMR WETFIELD 18G (MISCELLANEOUS) ×3 IMPLANT
GLOVE BIO SURGEON STRL SZ8 (GLOVE) ×3 IMPLANT
GLOVE SURG LX 6.5 MICRO (GLOVE) ×2
GLOVE SURG LX 8.0 MICRO (GLOVE) ×2
GLOVE SURG LX STRL 6.5 MICRO (GLOVE) ×1 IMPLANT
GLOVE SURG LX STRL 8.0 MICRO (GLOVE) ×1 IMPLANT
GOWN STRL REUS W/ TWL LRG LVL3 (GOWN DISPOSABLE) ×1 IMPLANT
GOWN STRL REUS W/ TWL XL LVL3 (GOWN DISPOSABLE) ×1 IMPLANT
GOWN STRL REUS W/TWL LRG LVL3 (GOWN DISPOSABLE) ×2
GOWN STRL REUS W/TWL XL LVL3 (GOWN DISPOSABLE) ×2
LENS IOL ACRYSOF IQ 22.5 (Intraocular Lens) ×3 IMPLANT
PACK CATARACT (MISCELLANEOUS) ×3 IMPLANT
PACK CATARACT DINGLEDEIN LX (MISCELLANEOUS) ×3 IMPLANT
PACK EYE AFTER SURG (MISCELLANEOUS) ×3 IMPLANT
SHLD EYE VISITEC  UNIV (MISCELLANEOUS) ×3 IMPLANT
SOL BSS BAG (MISCELLANEOUS) ×3
SOL PREP PVP 2OZ (MISCELLANEOUS) ×3
SOLUTION BSS BAG (MISCELLANEOUS) ×1 IMPLANT
SOLUTION PREP PVP 2OZ (MISCELLANEOUS) ×1 IMPLANT
SUT SILK 5-0 (SUTURE) ×3 IMPLANT
SYR 3ML LL SCALE MARK (SYRINGE) ×3 IMPLANT
SYR 5ML LL (SYRINGE) ×3 IMPLANT
SYR TB 1ML 27GX1/2 LL (SYRINGE) ×3 IMPLANT
WATER STERILE IRR 1000ML POUR (IV SOLUTION) ×3 IMPLANT
WIPE NON LINTING 3.25X3.25 (MISCELLANEOUS) ×3 IMPLANT

## 2015-02-20 NOTE — Anesthesia Preprocedure Evaluation (Signed)
Anesthesia Evaluation  Patient identified by MRN, date of birth, ID band Patient awake    Reviewed: Allergy & Precautions, NPO status , Patient's Chart, lab work & pertinent test results  History of Anesthesia Complications Negative for: history of anesthetic complications  Airway Mallampati: II  TM Distance: >3 FB Neck ROM: Full    Dental  (+) Teeth Intact   Pulmonary former smoker (quit x 40 yrs),          Cardiovascular hypertension, Pt. on medications and Pt. on home beta blockers + dysrhythmias (Hx of afib none now) + Valvular Problems/Murmurs (s/p mitral valve replacement)     Neuro/Psych    GI/Hepatic GERD-  Medicated and Controlled,  Endo/Other    Renal/GU      Musculoskeletal   Abdominal   Peds  Hematology   Anesthesia Other Findings   Reproductive/Obstetrics                             Anesthesia Physical Anesthesia Plan  ASA: III  Anesthesia Plan: MAC   Post-op Pain Management:    Induction: Intravenous  Airway Management Planned: Nasal Cannula  Additional Equipment:   Intra-op Plan:   Post-operative Plan:   Informed Consent: I have reviewed the patients History and Physical, chart, labs and discussed the procedure including the risks, benefits and alternatives for the proposed anesthesia with the patient or authorized representative who has indicated his/her understanding and acceptance.     Plan Discussed with:   Anesthesia Plan Comments:         Anesthesia Quick Evaluation

## 2015-02-20 NOTE — Interval H&P Note (Signed)
History and Physical Interval Note:  02/20/2015 7:18 AM  [name]  has presented today for surgery, with the diagnosis of cataract  The various methods of treatment have been discussed with the patient and family. After consideration of risks, benefits and other options for treatment, the patient has consented to  Procedure(s): CATARACT EXTRACTION PHACO AND INTRAOCULAR LENS PLACEMENT ([place]) (Right) as a surgical intervention .  The patient's history has been reviewed, patient examined, no change in status, stable for surgery.  I have reviewed the patient's chart and labs.  Questions were answered to the patient's satisfaction.     Danyale Ridinger

## 2015-02-20 NOTE — H&P (Signed)
See scanned H&P

## 2015-02-20 NOTE — Discharge Instructions (Addendum)
See handout.  Eye Surgery Discharge Instructions  Expect mild scratchy sensation or mild soreness. DO NOT RUB YOUR EYE!  The day of surgery: . Minimal physical activity, but bed rest is not required . No reading, computer work, or close hand work . No bending, lifting, or straining. . May watch TV  For 24 hours: . No driving, legal decisions, or alcoholic beverages . Safety precautions . Eat anything you prefer: It is better to start with liquids, then soup then solid foods. . __x___ Eye patch should be worn until postoperative exam tomorrow. . ____ Solar shield eyeglasses should be worn for comfort in the sunlight/patch while sleeping  Resume all regular medications including aspirin or Coumadin if these were discontinued prior to surgery. You may shower, bathe, shave, or wash your hair. Tylenol may be taken for mild discomfort.  Call your doctor if you experience significant pain, nausea, or vomiting, fever > 101 or other signs of infection. [phone] or [phone] Specific instructions:  Follow-up Information    Follow up with Estill Cotta, MD.   Specialty:  Ophthalmology   Why:  02/21/2015 at 10:35   Contact information:   [address]   [place] [address] [phone]

## 2015-02-20 NOTE — Op Note (Signed)
Date of Surgery: 02/20/2015 Date of Dictation: 02/20/2015 8:09 AM Pre-operative Diagnosis:  Nuclear Sclerotic Cataract right Eye Post-operative Diagnosis: same Procedure performed: Extra-capsular Cataract Extraction (ECCE) with placement of a posterior chamber intraocular lens (IOL) right Eye IOL:  Implant Name Type Inv. Item Serial No. Manufacturer Lot No. LRB No. Used  LENS IOL ACRYSOF IQ 22.5 - [id number] Intraocular Lens LENS IOL ACRYSOF IQ 22.5 [id number] ALCON   Right 1   Anesthesia: 2% Lidocaine and 4% Marcaine in a 50/50 mixture with 10 unites/ml of Hylenex given as a peribulbar Anesthesiologist: Anesthesiologist: Gunnar Fusi, MD CRNA: Silvana Newness, CRNA Complications: none Estimated Blood Loss: less than 1 ml  Description of procedure:  The patient was given anesthesia and sedation via intravenous access. The patient was then prepped and draped in the usual fashion. A 25-gauge needle was bent for initiating the capsulorhexis. A 5-0 silk suture was placed through the conjunctiva superior and inferiorly to serve as bridle sutures. Hemostasis was obtained at the superior limbus using an eraser cautery. A partial thickness groove was made at the anterior surgical limbus with a 64 Beaver blade and this was dissected anteriorly with an [employer]aya. The anterior chamber was entered at 10 o'clock with a 1.0 mm paracentesis knife and through the lamellar dissection with a 2.6 mm Alcon keratome. Epi-Shugarcaine 0.5 CC [9 cc BSS Plus (Alcon), 3 cc 4% preservative-free lidocaine (Hospira) and 4 cc 1:1000 preservative-free, bisulfite-free epinephrine] was injected via the paracentesis tract. DiscoVisc was injected to replace the aqueous and a continuous tear curvilinear capsulorhexis was performed using a bent 25-gauge needle.  Balance salt on a syringe was used to perform hydro-dissection and phacoemulsification was carried out using a divide and conquer technique. Procedure(s) with  comments: CATARACT EXTRACTION PHACO AND INTRAOCULAR LENS PLACEMENT ([facility]) (Right) - US01:31.2 AP 25.3% CDE40.13 Fluid lot # [id number]. Irrigation/aspiration was used to remove the residual cortex and the capsular bag was inflated with DiscoVisc. The intraocular lens was inserted into the capsular bag using a pre-loaded UltraSert Delivery System. Irrigation/aspiration was used to remove the residual DiscoVisc. The wound was inflated with balanced salt and checked for leaks. None were found. Miostat was injected via the paracentesis track and 0.1 ml of Vigamox containing 1 mg of drug  was injected via the paracentesis track. The wound was checked for leaks again and none were found.   The bridal sutures were removed and two drops of Vigamox were placed on the eye. An eye shield was placed to protect the eye and the patient was discharged to the recovery area in good condition.   Tiana Sivertson MD

## 2015-02-20 NOTE — Transfer of Care (Signed)
Immediate Anesthesia Transfer of Care Note  Patient: ALBANA SPIEKER  Procedure(s) Performed: Procedure(s) with comments: CATARACT EXTRACTION PHACO AND INTRAOCULAR LENS PLACEMENT ([facility]) (Right) - US01:31.2 AP 25.3% CDE40.13 Fluid lot # [id number]  Patient Location: Short Stay  Anesthesia Type:MAC  Level of Consciousness: awake, alert , oriented and patient cooperative  Airway & Oxygen Therapy: Patient Spontanous Breathing  Post-op Assessment: Report given to RN, Post -op Vital signs reviewed and stable and Patient moving all extremities X 4  Post vital signs: Reviewed and stable  Last Vitals:  Filed Vitals:   02/20/15 0814  BP: 109/50  Pulse: 56  Temp: 36.1 C  Resp: 16    Complications: No apparent anesthesia complications

## 2015-02-20 NOTE — Anesthesia Postprocedure Evaluation (Signed)
  Anesthesia Post-op Note  Patient: [name]  Procedure(s) Performed: Procedure(s) with comments: CATARACT EXTRACTION PHACO AND INTRAOCULAR LENS PLACEMENT ([facility]) (Right) - US01:31.2 AP 25.3% CDE40.13 Fluid lot # [id number]  Anesthesia type:MAC  Patient location: short stay  Post pain: Pain level controlled  Post assessment: Post-op Vital signs reviewed, Patient's Cardiovascular Status Stable, Respiratory Function Stable, Patent Airway and No signs of Nausea or vomiting  Post vital signs: Reviewed and stable  Last Vitals:  Filed Vitals:   02/20/15 0829  BP: 105/52  Pulse: 56  Temp:   Resp: 14    Level of consciousness: awake, alert  and patient cooperative  Complications: No apparent anesthesia complications

## 2015-04-28 ENCOUNTER — Inpatient Hospital Stay
Admission: EM | Admit: 2015-04-28 | Discharge: 2015-04-30 | DRG: 683 | Disposition: A | Discharge status: Home / Self Care | Payer: Medicare Other | Attending: Internal Medicine | Admitting: Internal Medicine

## 2015-04-28 ENCOUNTER — Encounter: Payer: Self-pay | Admitting: Emergency Medicine

## 2015-04-28 DIAGNOSIS — Z9841 Cataract extraction status, right eye: Secondary | ICD-10-CM

## 2015-04-28 DIAGNOSIS — Z79899 Other long term (current) drug therapy: Secondary | ICD-10-CM | POA: Diagnosis not present

## 2015-04-28 DIAGNOSIS — Z7982 Long term (current) use of aspirin: Secondary | ICD-10-CM | POA: Diagnosis not present

## 2015-04-28 DIAGNOSIS — N170 Acute kidney failure with tubular necrosis: Principal | ICD-10-CM | POA: Diagnosis present

## 2015-04-28 DIAGNOSIS — Z7901 Long term (current) use of anticoagulants: Secondary | ICD-10-CM | POA: Diagnosis not present

## 2015-04-28 DIAGNOSIS — Z961 Presence of intraocular lens: Secondary | ICD-10-CM | POA: Diagnosis present

## 2015-04-28 DIAGNOSIS — I5042 Chronic combined systolic (congestive) and diastolic (congestive) heart failure: Secondary | ICD-10-CM | POA: Diagnosis present

## 2015-04-28 DIAGNOSIS — E871 Hypo-osmolality and hyponatremia: Secondary | ICD-10-CM | POA: Diagnosis present

## 2015-04-28 DIAGNOSIS — T501X5A Adverse effect of loop [high-ceiling] diuretics, initial encounter: Secondary | ICD-10-CM | POA: Diagnosis present

## 2015-04-28 DIAGNOSIS — Z952 Presence of prosthetic heart valve: Secondary | ICD-10-CM | POA: Diagnosis not present

## 2015-04-28 DIAGNOSIS — E876 Hypokalemia: Secondary | ICD-10-CM | POA: Diagnosis present

## 2015-04-28 DIAGNOSIS — R531 Weakness: Secondary | ICD-10-CM | POA: Diagnosis present

## 2015-04-28 DIAGNOSIS — I110 Hypertensive heart disease with heart failure: Secondary | ICD-10-CM | POA: Diagnosis present

## 2015-04-28 DIAGNOSIS — I482 Chronic atrial fibrillation: Secondary | ICD-10-CM | POA: Diagnosis present

## 2015-04-28 DIAGNOSIS — N179 Acute kidney failure, unspecified: Secondary | ICD-10-CM

## 2015-04-28 DIAGNOSIS — N189 Chronic kidney disease, unspecified: Secondary | ICD-10-CM

## 2015-04-28 DIAGNOSIS — M1990 Unspecified osteoarthritis, unspecified site: Secondary | ICD-10-CM | POA: Diagnosis present

## 2015-04-28 DIAGNOSIS — Z87891 Personal history of nicotine dependence: Secondary | ICD-10-CM | POA: Diagnosis not present

## 2015-04-28 DIAGNOSIS — E860 Dehydration: Secondary | ICD-10-CM | POA: Diagnosis present

## 2015-04-28 DIAGNOSIS — K219 Gastro-esophageal reflux disease without esophagitis: Secondary | ICD-10-CM | POA: Diagnosis present

## 2015-04-28 LAB — CBC WITH DIFFERENTIAL/PLATELET
BASOS PCT: 1 %
Basophils Absolute: 0.1 10*3/uL (ref 0–0.1)
Eosinophils Absolute: 0.1 10*3/uL (ref 0–0.7)
Eosinophils Relative: 1 %
HEMATOCRIT: 45.6 % (ref 35.0–47.0)
HEMOGLOBIN: 15.3 g/dL (ref 12.0–16.0)
LYMPHS PCT: 13 %
Lymphs Abs: 0.7 10*3/uL — ABNORMAL LOW (ref 1.0–3.6)
MCH: 28.1 pg (ref 26.0–34.0)
MCHC: 33.6 g/dL (ref 32.0–36.0)
MCV: 83.6 fL (ref 80.0–100.0)
MONOS PCT: 14 %
Monocytes Absolute: 0.7 10*3/uL (ref 0.2–0.9)
NEUTROS ABS: 3.6 10*3/uL (ref 1.4–6.5)
NEUTROS PCT: 71 %
Platelets: 170 10*3/uL (ref 150–440)
RBC: 5.46 MIL/uL — ABNORMAL HIGH (ref 3.80–5.20)
RDW: 15.7 % — ABNORMAL HIGH (ref 11.5–14.5)
WBC: 5.2 10*3/uL (ref 3.6–11.0)

## 2015-04-28 LAB — URINALYSIS COMPLETE WITH MICROSCOPIC (ARMC ONLY)
BACTERIA UA: NONE SEEN
BILIRUBIN URINE: NEGATIVE
GLUCOSE, UA: NEGATIVE mg/dL
HGB URINE DIPSTICK: NEGATIVE
Ketones, ur: NEGATIVE mg/dL
Leukocytes, UA: NEGATIVE
NITRITE: NEGATIVE
Protein, ur: NEGATIVE mg/dL
SPECIFIC GRAVITY, URINE: 1.009 (ref 1.005–1.030)
pH: 6 (ref 5.0–8.0)

## 2015-04-28 LAB — TROPONIN I

## 2015-04-28 LAB — MAGNESIUM: Magnesium: 2.7 mg/dL — ABNORMAL HIGH (ref 1.7–2.4)

## 2015-04-28 LAB — BASIC METABOLIC PANEL
ANION GAP: 12 (ref 5–15)
BUN: 96 mg/dL — ABNORMAL HIGH (ref 6–20)
CALCIUM: 10.1 mg/dL (ref 8.9–10.3)
CHLORIDE: 89 mmol/L — AB (ref 101–111)
CO2: 29 mmol/L (ref 22–32)
Creatinine, Ser: 2.18 mg/dL — ABNORMAL HIGH (ref 0.44–1.00)
GFR calc non Af Amer: 21 mL/min — ABNORMAL LOW (ref >60)
GFR, EST AFRICAN AMERICAN: 24 mL/min — AB (ref >60)
Glucose, Bld: 125 mg/dL — ABNORMAL HIGH (ref 65–99)
POTASSIUM: 3.4 mmol/L — AB (ref 3.5–5.1)
Sodium: 130 mmol/L — ABNORMAL LOW (ref 135–145)

## 2015-04-28 LAB — PROTIME-INR
INR: 2.8
Prothrombin Time: 29.6 seconds — ABNORMAL HIGH (ref 11.4–15.0)

## 2015-04-28 LAB — PHOSPHORUS: PHOSPHORUS: 3.6 mg/dL (ref 2.5–4.6)

## 2015-04-28 MED ORDER — SODIUM CHLORIDE 0.9 % IV BOLUS (SEPSIS)
1000.0000 mL | Freq: Once | INTRAVENOUS | Status: AC
  Administered 2015-04-28: 1000 mL via INTRAVENOUS

## 2015-04-28 MED ORDER — SENNOSIDES-DOCUSATE SODIUM 8.6-50 MG PO TABS: 1.0000 | ORAL_TABLET | Freq: Every evening | ORAL | Status: DC | PRN

## 2015-04-28 MED ORDER — ONDANSETRON HCL 4 MG/2ML IJ SOLN: 4.0000 mg | Freq: Four times a day (QID) | INTRAMUSCULAR | Status: DC | PRN

## 2015-04-28 MED ORDER — ERYTHROMYCIN 5 MG/GM OP OINT: TOPICAL_OINTMENT | Freq: Four times a day (QID) | OPHTHALMIC | Status: DC

## 2015-04-28 MED ORDER — TRAMADOL HCL 50 MG PO TABS: 25.0000 mg | ORAL_TABLET | Freq: Two times a day (BID) | ORAL | Status: DC | PRN

## 2015-04-28 MED ORDER — SODIUM CHLORIDE 0.9 % IV SOLN
INTRAVENOUS | Status: DC
  Administered 2015-04-28 – 2015-04-29 (×2): via INTRAVENOUS

## 2015-04-28 MED ORDER — HYDRALAZINE HCL 25 MG PO TABS: 100.0000 mg | ORAL_TABLET | Freq: Once | ORAL | Status: DC

## 2015-04-28 MED ORDER — ACETAMINOPHEN 325 MG PO TABS
650.0000 mg | ORAL_TABLET | Freq: Four times a day (QID) | ORAL | Status: DC | PRN
  Administered 2015-04-29 – 2015-04-30 (×3): 650 mg via ORAL
  Filled 2015-04-28 (×3): qty 2

## 2015-04-28 MED ORDER — METOPROLOL TARTRATE 50 MG PO TABS: 50.0000 mg | ORAL_TABLET | Freq: Once | ORAL | Status: DC

## 2015-04-28 MED ORDER — ACETAMINOPHEN 650 MG RE SUPP: 650.0000 mg | Freq: Four times a day (QID) | RECTAL | Status: DC | PRN

## 2015-04-28 MED ORDER — WARFARIN - PHARMACIST DOSING INPATIENT
Freq: Every day | Status: DC
  Administered 2015-04-28: 17:00:00

## 2015-04-28 MED ORDER — ASPIRIN EC 81 MG PO TBEC
81.0000 mg | DELAYED_RELEASE_TABLET | Freq: Every day | ORAL | Status: DC
  Administered 2015-04-28 – 2015-04-30 (×3): 81 mg via ORAL
  Filled 2015-04-28 (×3): qty 1

## 2015-04-28 MED ORDER — ERYTHROMYCIN 5 MG/GM OP OINT: TOPICAL_OINTMENT | OPHTHALMIC | Status: DC

## 2015-04-28 MED ORDER — ALBUTEROL SULFATE (2.5 MG/3ML) 0.083% IN NEBU: 2.5000 mg | INHALATION_SOLUTION | RESPIRATORY_TRACT | Status: DC | PRN

## 2015-04-28 MED ORDER — AMIODARONE HCL 200 MG PO TABS
200.0000 mg | ORAL_TABLET | Freq: Every day | ORAL | Status: DC
  Administered 2015-04-28 – 2015-04-30 (×2): 200 mg via ORAL
  Filled 2015-04-28 (×2): qty 1

## 2015-04-28 MED ORDER — POTASSIUM CHLORIDE CRYS ER 20 MEQ PO TBCR
40.0000 meq | EXTENDED_RELEASE_TABLET | Freq: Once | ORAL | Status: AC
  Administered 2015-04-28: 40 meq via ORAL
  Filled 2015-04-28: qty 2

## 2015-04-28 MED ORDER — PANTOPRAZOLE SODIUM 40 MG PO TBEC
40.0000 mg | DELAYED_RELEASE_TABLET | Freq: Every day | ORAL | Status: DC
  Administered 2015-04-28 – 2015-04-30 (×3): 40 mg via ORAL
  Filled 2015-04-28 (×3): qty 1

## 2015-04-28 MED ORDER — ONDANSETRON HCL 4 MG PO TABS
4.0000 mg | ORAL_TABLET | Freq: Four times a day (QID) | ORAL | Status: DC | PRN
Start: 2015-04-28 — End: 2015-04-30

## 2015-04-28 MED ORDER — BISACODYL 5 MG PO TBEC: 5.0000 mg | DELAYED_RELEASE_TABLET | Freq: Every day | ORAL | Status: DC | PRN

## 2015-04-28 MED ORDER — METOPROLOL TARTRATE 25 MG PO TABS
25.0000 mg | ORAL_TABLET | Freq: Two times a day (BID) | ORAL | Status: DC
  Administered 2015-04-28 – 2015-04-29 (×2): 25 mg via ORAL
  Filled 2015-04-28 (×3): qty 1

## 2015-04-28 MED ORDER — WARFARIN SODIUM 5 MG PO TABS
5.0000 mg | ORAL_TABLET | Freq: Once | ORAL | Status: AC
  Administered 2015-04-28: 5 mg via ORAL
  Filled 2015-04-28: qty 1

## 2015-04-28 NOTE — ED Notes (Signed)
Pt to ed with c/o weakness and dehydration,  Pt states she was seen yesterday at dr Sanjuan Dame office for same,  Pt reports nausea, denies vomiting, denies diarrhea, reports last BM was this am and that it was hard.

## 2015-04-28 NOTE — ED Provider Notes (Signed)
[facility] Emergency Department Provider Note   ____________________________________________  Time seen: On arrival I have reviewed the triage vital signs and the triage nursing note.  HISTORY  Chief Complaint Weakness   Historian Patient and her daughter  HPI [name] is a 80 y.o. female who has a history of mechanical mitral valve which was performed at [facility], as well as congestive heart failure, hypertension, and atrial fibrillation for which she takes Coumadin, who is here for possible dehydration. Patient had taken increased doses of "medication for leg swelling "over the past 2 weeks, and when she saw her primary care physician yesterday she had blood work drawn. She was called by the nurse this morning who indicated to the patient that she had dehydration and would need IV fluids, was recommended to go to the emergency department for further treatment and evaluation.  She does have a history of atrial fibrillation. Triage nurse was concerned about heart rate in the mid 30s, and palpation directly back to room. Patient is denying dizziness, chest pain, or trouble breathing.    Past Medical History  Diagnosis Date  . Shortness of breath dyspnea   . Hypertension   . CHF (congestive heart failure) ([place])   . GERD (gastroesophageal reflux disease)   . Arthritis     Patient Active Problem List   Diagnosis Date Noted  . H/O mitral valve replacement with mechanical valve 11/04/2014  . Long term current use of anticoagulant therapy 09/28/2014  . Acute on chronic diastolic heart failure ([place]) 09/19/2014  . 2Nd degree atrioventricular block 09/06/2014  . H/O prosthetic heart valve 09/05/2014  . CCF (congestive cardiac failure) ([place]) 11/28/2013  . Generalized OA 11/28/2013  . HLD (hyperlipidemia) 11/28/2013  . Atrial fibrillation with rapid ventricular response ([place]) 11/09/2013    Past Surgical History  Procedure Laterality Date  . Cardiac  valve replacement    . Cardiac catheterization    . Pyloric stenosis    . Breast surgery    . Cataract extraction w/phaco Right 02/20/2015    Procedure: CATARACT EXTRACTION PHACO AND INTRAOCULAR LENS PLACEMENT ([facility]);  Surgeon: Estill Cotta, MD;  Location: [facility] ORS;  Service: Ophthalmology;  Laterality: Right;  US01:31.2 AP 25.3% CDE40.13 Fluid lot # [id number]    Current Outpatient Rx  Name  Route  Sig  Dispense  Refill  . acetaminophen (TYLENOL) 500 MG tablet   Oral   Take 500 mg by mouth every 6 (six) hours as needed for mild pain, moderate pain, fever or headache.         Marland Kitchen amiodarone (PACERONE) 200 MG tablet      TAKE 1 TABLET EVERY DAY         . aspirin EC 81 MG tablet   Oral   Take 81 mg by mouth daily.          . furosemide (LASIX) 40 MG tablet   Oral   Take 60 mg by mouth 2 (two) times daily.         . metolazone (ZAROXOLYN) 2.5 MG tablet   Oral   Take 2.5 mg by mouth daily as needed (for swelling).          . metoprolol tartrate (LOPRESSOR) 25 MG tablet   Oral   Take 25 mg by mouth 2 (two) times daily.         . Multiple Vitamin (MULTI-VITAMINS) TABS   Oral   Take 1 tablet by mouth daily.          Marland Kitchen  pantoprazole (PROTONIX) 40 MG tablet   Oral   Take 40 mg by mouth daily.          Marland Kitchen spironolactone (ALDACTONE) 25 MG tablet   Oral   Take 1 tablet by mouth 2 (two) times daily.         . traMADol (ULTRAM) 50 MG tablet   Oral   Take 25-50 mg by mouth 2 (two) times daily as needed for moderate pain or severe pain.          . vitamin C (ASCORBIC ACID) 500 MG tablet   Oral   Take 500 mg by mouth daily as needed (for cold symptoms).          . warfarin (COUMADIN) 5 MG tablet   Oral   Take 5 mg by mouth See admin instructions. Daily at bedtime on Thursday, Friday, Saturday, and Sunday         . warfarin (COUMADIN) 7.5 MG tablet   Oral   Take 7.5 mg by mouth See admin instructions. Daily at bedtime on Monday, Tuesday, and  Wednesday           Allergies Ace inhibitors; Mercury; Nickel; Silver; Cephalexin; and Penicillins  History reviewed. No pertinent family history.  Social History Social History  Substance Use Topics  . Smoking status: Former Research scientist (life sciences)  . Smokeless tobacco: None  . Alcohol Use: Yes     Comment: occasional    Review of Systems  Constitutional: Negative for fever. Positive for intermittent sweats. Eyes: Negative for visual changes. ENT: Negative for sore throat. Cardiovascular: Negative for chest pain. Respiratory: Negative for shortness of breath. Gastrointestinal: Negative for abdominal pain, vomiting and diarrhea. Positive for intermittent nausea. Genitourinary: Negative for dysuria. Musculoskeletal: Negative for back pain. Skin: Negative for rash. Neurological: Negative for headache. 10 point Review of Systems otherwise negative ____________________________________________   PHYSICAL EXAM:  VITAL SIGNS: ED Triage Vitals  Enc Vitals Group     BP 04/28/15 0940 115/81 mmHg     Pulse Rate 04/28/15 0940 41     Resp 04/28/15 0940 20     Temp 04/28/15 0940 97.4 F (36.3 C)     Temp Source 04/28/15 0940 Oral     SpO2 04/28/15 0940 95 %     Weight 04/28/15 0940 167 lb (75.751 kg)     Height 04/28/15 0940 5\' 5"  (1.651 m)     Head Cir --      Peak Flow --      Pain Score 04/28/15 0940 0     Pain Loc --      Pain Edu? --      Excl. in [place]? --      Constitutional: Alert and oriented. Well appearing and in no distress. Eyes: Conjunctivae are normal. PERRL. Normal extraocular movements. ENT   Head: Normocephalic and atraumatic.   Nose: No congestion/rhinnorhea.   Mouth/Throat: Mucous membranes are moist.   Neck: No stridor. Cardiovascular/Chest: Irregularly irregular. Rate near 80.  Positive mechanical valve click. No murmurs, rubs, or gallops. Respiratory: Normal respiratory effort without tachypnea nor retractions. Breath sounds are clear and equal  bilaterally. No wheezes/rales/rhonchi. Gastrointestinal: Soft. No distention, no guarding, no rebound. Nontender   Genitourinary/rectal:Deferred Musculoskeletal: Nontender with normal range of motion in all extremities. No joint effusions.  No lower extremity tenderness.  No edema. Neurologic:  Normal speech and language. No gross or focal neurologic deficits are appreciated. Skin:  Skin is warm, dry and intact. No rash noted. Psychiatric: Mood and affect  are normal. Speech and behavior are normal. Patient exhibits appropriate insight and judgment.  ____________________________________________   EKG I, Lisa Roca, MD, the attending physician have personally viewed and interpreted all ECGs.  Atrial fibrillation. 108 bpm. Left bundle branch block. ____________________________________________  LABS (pertinent positives/negatives)  White blood count emergent 2, hemoglobin 15.3, platelet count 170 INR 2.8 Basic metabolic panel significant for sodium 1:30, potassium 3.4, chloride 89, CO2 29, calcium 10.1, glucose 125, creatinine 2.18 Troponin pending Urinalysis pending  ____________________________________________  RADIOLOGY All Xrays were viewed by me. Imaging interpreted by Radiologist.  None __________________________________________  PROCEDURES  Procedure(s) performed: None  Critical Care performed: None  ____________________________________________   ED COURSE / ASSESSMENT AND PLAN  CONSULTATIONS: None  Pertinent labs & imaging results that were available during my care of the patient were reviewed by me and considered in my medical decision making (see chart for details).  I was called to go directly to the patient's room upon arrival due to the triage heart rate around 39 per the triage nurse. Upon placing patient on a cardiac monitor, her rhythm was atrial fibrillation with a heart rate around 80. The O2 sat monitor was showing 100% oxygen, however the heart rate  off of the O2 sat monitor was extremely irregular, at times monitoring 39, and at other times on 110 bpm.  I do not think that the triage heart rate was accurately reflecting the patient's heart rate, was erroneous due to being collected off of the O2 sat.  Delay in obtaining the creatinine due to lab issue. Creatinine is 2.18, and her previous creatinine has apparently been at 0.4. This does sound consistent with a history that they're telling me that she was called due to abnormal labs for her kidneys.  Patient was given IV 1 L normal saline, here in the emergency department. I will admit her for observation and treatment of her acute renal failure. Urinalysis is pending as well as troponin is pending at the time of transfer of care to the hospitalist, Dr. Bridgett Larsson.  No urinary symptoms.. No abdominal pain.  She has had some intermittent nausea and sweats, and I'm unsure the etiology of this, however she is feeling okay now. I do not think her symptoms are due to a cardiac etiology.  Patient / Family / Caregiver informed of clinical course, medical decision-making process, and agree with plan.  ___________________________________________   FINAL CLINICAL IMPRESSION(S) / ED DIAGNOSES   Final diagnoses:  Acute renal failure, unspecified acute renal failure type ([place])  Dehydration       Lisa Roca, MD 04/28/15 1432

## 2015-04-28 NOTE — ED Notes (Signed)
Called floor to inform that patient is on the way up.  Spoke with Leda Gauze

## 2015-04-28 NOTE — H&P (Signed)
[place] [facility] [place] NAME: [name]    MR#:  [id number]  DATE OF BIRTH:  Apr 05, 1935  DATE OF ADMISSION:  04/28/2015  PRIMARY CARE PHYSICIAN: Rusty Aus., MD   REQUESTING/REFERRING PHYSICIAN: Lisa Roca, MD  CHIEF COMPLAINT:   Chief Complaint  Patient presents with  . Weakness   Weakness and dehydration for couple days HISTORY OF PRESENT ILLNESS:  [name]  is a 80 y.o. female with a known history of CHF, hypertension and A. fib. The patient has a history of CHF and A. fib. She has taken increased dose of medication for leg swelling for the past 2 weeks. He has had generalized weakness and the nausea for the past couple days. She saw her primary care physician yesterday and had blood work. She was called by nurse this morning. Hold her that she had dehydration and recommended to go to the ED. The patient denies any shortness of breath, palpitation or cough or sputum or leg edema.  PAST MEDICAL HISTORY:   Past Medical History  Diagnosis Date  . Shortness of breath dyspnea   . Hypertension   . CHF (congestive heart failure) ([place])   . GERD (gastroesophageal reflux disease)   . Arthritis     PAST SURGICAL HISTORY:   Past Surgical History  Procedure Laterality Date  . Cardiac valve replacement    . Cardiac catheterization    . Pyloric stenosis    . Breast surgery    . Cataract extraction w/phaco Right 02/20/2015    Procedure: CATARACT EXTRACTION PHACO AND INTRAOCULAR LENS PLACEMENT ([facility]);  Surgeon: Estill Cotta, MD;  Location: [facility] ORS;  Service: Ophthalmology;  Laterality: Right;  US01:31.2 AP 25.3% CDE40.13 Fluid lot # [id number]    SOCIAL HISTORY:   Social History  Substance Use Topics  . Smoking status: Former Research scientist (life sciences)  . Smokeless tobacco: Not on file  . Alcohol Use: Yes     Comment: occasional    FAMILY HISTORY:  History reviewed. No pertinent family history. mother had a stroke and  father had ESRD on dialysis. Both deceaed.  DRUG ALLERGIES:   Allergies  Allergen Reactions  . Ace Inhibitors     Other reaction(s): Cough  . Mercury     Other reaction(s): Unknown  . Nickel   . Silver Dermatitis    Severe itching  . Cephalexin Rash  . Penicillins Rash    REVIEW OF SYSTEMS:  CONSTITUTIONAL: No fever, but has dizziness and generalized weakness.  EYES: No blurred or double vision.  EARS, NOSE, AND THROAT: No tinnitus or ear pain.  RESPIRATORY: No cough, shortness of breath, wheezing or hemoptysis.  CARDIOVASCULAR: No chest pain, orthopnea, edema.  GASTROINTESTINAL: Mild nausea, no vomiting, diarrhea or abdominal pain.  GENITOURINARY: No dysuria, hematuria.  ENDOCRINE: No polyuria, nocturia,  HEMATOLOGY: No anemia, but has easy bruising, no  bleeding SKIN: No rash or lesion.  MUSCULOSKELETAL: No joint pain or arthritis.   NEUROLOGIC: No tingling, numbness, weakness.  PSYCHIATRY: No anxiety or depression.   MEDICATIONS AT HOME:   Prior to Admission medications   Medication Sig Start Date End Date Taking? Authorizing Provider  acetaminophen (TYLENOL) 500 MG tablet Take 500 mg by mouth every 6 (six) hours as needed for mild pain, moderate pain, fever or headache.   Yes Historical Provider, MD  amiodarone (PACERONE) 200 MG tablet TAKE 1 TABLET EVERY DAY 10/28/14  Yes Historical Provider, MD  aspirin EC 81 MG tablet Take 81 mg by  mouth daily.    Yes Historical Provider, MD  furosemide (LASIX) 40 MG tablet Take 60 mg by mouth 2 (two) times daily.   Yes Historical Provider, MD  metolazone (ZAROXOLYN) 2.5 MG tablet Take 2.5 mg by mouth daily as needed (for swelling).    Yes Historical Provider, MD  metoprolol tartrate (LOPRESSOR) 25 MG tablet Take 25 mg by mouth 2 (two) times daily.   Yes Historical Provider, MD  Multiple Vitamin (MULTI-VITAMINS) TABS Take 1 tablet by mouth daily.    Yes Historical Provider, MD  pantoprazole (PROTONIX) 40 MG tablet Take 40 mg by mouth  daily.    Yes Historical Provider, MD  spironolactone (ALDACTONE) 25 MG tablet Take 1 tablet by mouth 2 (two) times daily. 10/03/14 10/03/15 Yes Historical Provider, MD  traMADol (ULTRAM) 50 MG tablet Take 25-50 mg by mouth 2 (two) times daily as needed for moderate pain or severe pain.    Yes Historical Provider, MD  vitamin C (ASCORBIC ACID) 500 MG tablet Take 500 mg by mouth daily as needed (for cold symptoms).    Yes Historical Provider, MD  warfarin (COUMADIN) 5 MG tablet Take 5 mg by mouth See admin instructions. Daily at bedtime on Thursday, Friday, Saturday, and Sunday   Yes Historical Provider, MD  warfarin (COUMADIN) 7.5 MG tablet Take 7.5 mg by mouth See admin instructions. Daily at bedtime on Monday, Tuesday, and Wednesday   Yes Historical Provider, MD      VITAL SIGNS:  Blood pressure 107/57, pulse 38, temperature 97.4 F (36.3 C), temperature source Oral, resp. rate 20, height 5\' 5"  (1.651 m), weight 75.751 kg (167 lb), SpO2 95 %.  PHYSICAL EXAMINATION:  GENERAL:  80 y.o.-year-old patient lying in the bed with no acute distress.  EYES: Pupils equal, round, reactive to light and accommodation. No scleral icterus. Extraocular muscles intact.  HEENT: Head atraumatic, normocephalic. Oropharynx and nasopharynx clear.  NECK:  Supple, no jugular venous distention. No thyroid enlargement, no tenderness.  LUNGS: Normal breath sounds bilaterally, no wheezing, rales,rhonchi or crepitation. No use of accessory muscles of respiration.  CARDIOVASCULAR: S1, S2 normal. No murmurs, rubs, or gallops.  ABDOMEN: Soft, nontender, nondistended. Bowel sounds present. No organomegaly or mass.  EXTREMITIES: No pedal edema, cyanosis, or clubbing.  NEUROLOGIC: Cranial nerves II through XII are intact. Muscle strength 4/5 in all extremities. Sensation intact. Gait not checked.  PSYCHIATRIC: The patient is alert and oriented x 3.  SKIN: No obvious rash, lesion, or ulcer. Old bruises.  LABORATORY PANEL:    CBC  Recent Labs Lab 04/28/15 1018  WBC 5.2  HGB 15.3  HCT 45.6  PLT 170   ------------------------------------------------------------------------------------------------------------------  Chemistries   Recent Labs Lab 04/28/15 1018  NA 130*  K 3.4*  CL 89*  CO2 29  GLUCOSE 125*  BUN 96*  CREATININE 2.18*  CALCIUM 10.1   ------------------------------------------------------------------------------------------------------------------  Cardiac Enzymes No results for input(s): TROPONINI in the last 168 hours. ------------------------------------------------------------------------------------------------------------------  RADIOLOGY:  No results found.  EKG:   Orders placed or performed in visit on 03/22/12  . EKG 12-Lead   A. fib at 108 BPM with left bundle branch block.  IMPRESSION AND PLAN:   Acute renal failure Hyponatremia Hypokalemia Chronic A. fib History of chronic diastolic CHF Hypertension  The patient will be admitted to medical floor with telemetry monitor. I will hold Lasix and spironolactone, give gentle rehydration with normal saline and follow-up BMP. Follow-up renal ultrasound. Give potassium supplement, follow-up magnesium level and BMP. For chronic A.  fib, continue aspirin, Lopressor and Coumadin pharmacy to dose. Follow-up INR. History of chronic systolic CHF and hypertension, hold Lasix and spironolactone.  All the records are reviewed and case discussed with ED provider. Management plans discussed with the patient, her daughter and they are in agreement.  CODE STATUS: Full code  TOTAL TIME TAKING CARE OF THIS PATIENT: 56 minutes.    Demetrios Loll M.D on 04/28/2015 at 2:52 PM  Between 7am to 6pm - Pager - [phone]  After 6pm go to [email or web address] - password [email or web address] [facility]  [facility]  Office  [phone]  CC: Primary care physician; Rusty Aus., MD

## 2015-04-28 NOTE — Progress Notes (Signed)
ANTICOAGULATION CONSULT NOTE - Initial Consult  Pharmacy Consult for warfarin Indication: atrial fibrillation  Allergies  Allergen Reactions  . Ace Inhibitors     Other reaction(s): Cough  . Mercury     Other reaction(s): Unknown  . Nickel   . Silver Dermatitis    Severe itching  . Cephalexin Rash  . Penicillins Rash    Patient Measurements: Height: 5\' 5"  (165.1 cm) Weight: 167 lb (75.751 kg) IBW/kg (Calculated) : 57  Vital Signs: Temp: 98.1 F (36.7 C) (10/28 1625) Temp Source: Oral (10/28 1625) BP: 113/51 mmHg (10/28 1625) Pulse Rate: 98 (10/28 1625)  Labs:  Recent Labs  04/28/15 1018  HGB 15.3  HCT 45.6  PLT 170  LABPROT 29.6*  INR 2.80  CREATININE 2.18*  TROPONINI <0.03    Estimated Creatinine Clearance: 22 mL/min (by C-G formula based on Cr of 2.18).   Medical History: Past Medical History  Diagnosis Date  . Shortness of breath dyspnea   . Hypertension   . CHF (congestive heart failure) ([place])   . GERD (gastroesophageal reflux disease)   . Arthritis     Assessment: Pharmacy consulted to dose warfarin in this 80 year old female who was taking warfarin prior to admission for atrial fibrillation. Of note, patient was also taking amiodarone prior to admission, which has been continued inpatient.  Home regimen is as follows: Warfarin 5mg  PO Thursday, Friday, Saturday, & Sunday Warfarin 7.5mg  PO Monday, Tuesday, & Wednesday  Patient's INR is therapeutic at 2.80 on admission  Goal of Therapy:  INR 2-3 Monitor platelets by anticoagulation protocol: Yes   Plan:  Will continue with home regimen and give a 5mg  dose today.  Pharmacy will continue to monitor CBC and INR and adjust dose as needed. Thank you for the consult.  Darylene Price Demtrius Rounds 04/28/2015,4:50 PM

## 2015-04-29 ENCOUNTER — Inpatient Hospital Stay: Payer: Medicare Other

## 2015-04-29 LAB — BASIC METABOLIC PANEL
ANION GAP: 8 (ref 5–15)
BUN: 61 mg/dL — ABNORMAL HIGH (ref 6–20)
CALCIUM: 9.3 mg/dL (ref 8.9–10.3)
CO2: 28 mmol/L (ref 22–32)
CREATININE: 1.26 mg/dL — AB (ref 0.44–1.00)
Chloride: 102 mmol/L (ref 101–111)
GFR calc Af Amer: 46 mL/min — ABNORMAL LOW (ref >60)
GFR, EST NON AFRICAN AMERICAN: 40 mL/min — AB (ref >60)
Glucose, Bld: 115 mg/dL — ABNORMAL HIGH (ref 65–99)
Potassium: 3.4 mmol/L — ABNORMAL LOW (ref 3.5–5.1)
SODIUM: 138 mmol/L (ref 135–145)

## 2015-04-29 LAB — CBC
HCT: 40.6 % (ref 35.0–47.0)
Hemoglobin: 13.3 g/dL (ref 12.0–16.0)
MCH: 27.5 pg (ref 26.0–34.0)
MCHC: 32.8 g/dL (ref 32.0–36.0)
MCV: 83.8 fL (ref 80.0–100.0)
PLATELETS: 149 10*3/uL — AB (ref 150–440)
RBC: 4.85 MIL/uL (ref 3.80–5.20)
RDW: 15.9 % — ABNORMAL HIGH (ref 11.5–14.5)
WBC: 3.1 10*3/uL — ABNORMAL LOW (ref 3.6–11.0)

## 2015-04-29 LAB — PROTIME-INR
INR: 3.08
PROTHROMBIN TIME: 31.8 s — AB (ref 11.4–15.0)

## 2015-04-29 MED ORDER — ZOLPIDEM TARTRATE 5 MG PO TABS
5.0000 mg | ORAL_TABLET | Freq: Every evening | ORAL | Status: DC | PRN
  Filled 2015-04-29: qty 1

## 2015-04-29 NOTE — Progress Notes (Signed)
Initial Nutrition Assessment    INTERVENTION:   Meals and Snacks: Cater to patient preferences Education: provided written and verbal education on CHF and low sodium diet to pt and her son. Very receptive to education; adherence likely. See Education note   NUTRITION DIAGNOSIS:   Food and nutrition related knowledge deficit related to chronic illness as evidenced by  (consult for diet education).  GOAL:   Patient will meet greater than or equal to 90% of their needs  MONITOR:    (Energy Intake, Knowledge, Digestive System, Electrolyte/Renal Profile, Glucose PRofile)  REASON FOR ASSESSMENT:   Consult Diet education  ASSESSMENT:    Pt admitted with dehydration, ARF with hyponatremia  Past Medical History  Diagnosis Date  . Shortness of breath dyspnea   . Hypertension   . CHF (congestive heart failure) ([place])   . GERD (gastroesophageal reflux disease)   . Arthritis      Diet Order:  Diet Heart Room service appropriate?: Yes; Fluid consistency:: Thin   Energy Intake: appetite good, eating outside food on visit today  Electrolyte and Renal Profile:  Recent Labs Lab 04/28/15 1018 04/28/15 1716 04/29/15 0811  BUN 96*  --  61*  CREATININE 2.18*  --  1.26*  NA 130*  --  138  K 3.4*  --  3.4*  MG  --  2.7*  --   PHOS  --  3.6  --    Glucose Profile: No results for input(s): GLUCAP in the last 72 hours.  Last BM:  10/28   Meds: reviewed  Height:   Ht Readings from Last 1 Encounters:  04/28/15 5\' 5"  (1.651 m)    Weight: reports some fluid wt gain, no wt loss  Wt Readings from Last 1 Encounters:  04/28/15 167 lb (75.751 kg)    BMI:  Body mass index is 27.79 kg/(m^2).  EDUCATION NEEDS:   Education needs addressed  LOW Care Level  Kerman Passey MS, [place], LDN [phone] Pager

## 2015-04-29 NOTE — Progress Notes (Signed)
Spoke with Dr. Vianne Bulls to request K-pad order per patient request.  Order received and entered.

## 2015-04-29 NOTE — Progress Notes (Signed)
ANTICOAGULATION CONSULT NOTE - Follow up Pharmacy Consult for warfarin Indication: atrial fibrillation  Allergies  Allergen Reactions  . Ace Inhibitors     Other reaction(s): Cough  . Mercury     Other reaction(s): Unknown  . Nickel   . Silver Dermatitis    Severe itching  . Cephalexin Rash  . Penicillins Rash    Patient Measurements: Height: 5\' 5"  (165.1 cm) Weight: 167 lb (75.751 kg) IBW/kg (Calculated) : 57  Vital Signs: Temp: 97.7 F (36.5 C) (10/29 0921) Temp Source: Oral (10/29 0921) BP: 100/60 mmHg (10/29 0921) Pulse Rate: 84 (10/29 0921)  Labs:  Recent Labs  04/28/15 1018 04/29/15 0811  HGB 15.3 13.3  HCT 45.6 40.6  PLT 170 149*  LABPROT 29.6* 31.8*  INR 2.80 3.08  CREATININE 2.18* 1.26*  TROPONINI <0.03  --     Estimated Creatinine Clearance: 38.1 mL/min (by C-G formula based on Cr of 1.26).   Medical History: Past Medical History  Diagnosis Date  . Shortness of breath dyspnea   . Hypertension   . CHF (congestive heart failure) ([place])   . GERD (gastroesophageal reflux disease)   . Arthritis     Assessment: Pharmacy consulted to dose warfarin in this 80 year old female who was taking warfarin prior to admission for atrial fibrillation. Of note, patient was also taking amiodarone prior to admission, which has been continued inpatient.  Home regimen is as follows: Warfarin 5mg  PO Thursday, Friday, Saturday, & Sunday Warfarin 7.5mg  PO Monday, Tuesday, & Wednesday  Patient's INR is therapeutic at 2.80 on admission  Goal of Therapy:  INR 2-3 Monitor platelets by anticoagulation protocol: Yes   Plan:  Will hold today's dose and order INR for tomorrow morning.     Pharmacy will continue to monitor CBC and INR and adjust dose as needed.    Olivia Canter, RPh Clinical Pharmacist  04/29/2015,11:17 AM

## 2015-04-29 NOTE — Plan of Care (Signed)
Problem: Phase I Progression Outcomes Goal: Pain controlled with appropriate interventions Outcome: Progressing Tylenol given x1 at patient request for generalized aching.  Also, ordered k-pad for back pain at patient's request, this is helping pain.  Assisted with repositioning as well. Goal: OOB as tolerated unless otherwise ordered Outcome: Progressing Patient up ambulating in room; up to chair for most of afternoon.

## 2015-04-29 NOTE — Progress Notes (Signed)
[place] at [place] NAME: [name]    MR#:  [id number]  DATE OF BIRTH:  June 09, 1935  SUBJECTIVE: Admitted for acute renal failure due to diuretics. She had generalized weakness, nausea. Taking extra dose of diuretic Zaroxolyn.   CHIEF COMPLAINT:   Chief Complaint  Patient presents with  . Weakness   patient feels better. No nausea, no vomiting, no generalized weakness, no shortness of breath. Son had a lot of questions about her salt intake, patient eats outside all the time and he is really concerned about her dietary indiscretion with salt intake. He wants dietician  consult.  REVIEW OF SYSTEMS:    Review of Systems  Constitutional: Negative for fever and chills.  HENT: Negative for hearing loss.   Eyes: Negative for blurred vision, double vision and photophobia.  Respiratory: Negative for cough, hemoptysis and shortness of breath.   Cardiovascular: Negative for palpitations, orthopnea and leg swelling.  Gastrointestinal: Negative for vomiting, abdominal pain and diarrhea.  Genitourinary: Negative for dysuria and urgency.  Musculoskeletal: Negative for myalgias and neck pain.  Skin: Negative for rash.  Neurological: Negative for dizziness, focal weakness, seizures, weakness and headaches.  Psychiatric/Behavioral: Negative for memory loss. The patient does not have insomnia.     Nutrition:  Tolerating Diet: Tolerating PT:      DRUG ALLERGIES:   Allergies  Allergen Reactions  . Ace Inhibitors     Other reaction(s): Cough  . Mercury     Other reaction(s): Unknown  . Nickel   . Silver Dermatitis    Severe itching  . Cephalexin Rash  . Penicillins Rash    VITALS:  Blood pressure 100/60, pulse 84, temperature 97.7 F (36.5 C), temperature source Oral, resp. rate 19, height 5\' 5"  (1.651 m), weight 75.751 kg (167 lb), SpO2 97 %.  PHYSICAL EXAMINATION:   Physical Exam  GENERAL:  80 y.o.-year-old patient  lying in the bed with no acute distress.  EYES: Pupils equal, round, reactive to light and accommodation. No scleral icterus. Extraocular muscles intact.  HEENT: Head atraumatic, normocephalic. Oropharynx and nasopharynx clear.  NECK:  Supple, no jugular venous distention. No thyroid enlargement, no tenderness.  LUNGS: Normal breath sounds bilaterally, no wheezing, rales,rhonchi or crepitation. No use of accessory muscles of respiration.  CARDIOVASCULAR: S1, S2 normal. No murmurs, rubs, or gallops.  ABDOMEN: Soft, nontender, nondistended. Bowel sounds present. No organomegaly or mass.  EXTREMITIES: No pedal edema, cyanosis, or clubbing.  NEUROLOGIC: Cranial nerves II through XII are intact. Muscle strength 5/5 in all extremities. Sensation intact. Gait not checked.  PSYCHIATRIC: The patient is alert and oriented x 3.  SKIN: No obvious rash, lesion, or ulcer.    LABORATORY PANEL:   CBC  Recent Labs Lab 04/29/15 0811  WBC 3.1*  HGB 13.3  HCT 40.6  PLT 149*   ------------------------------------------------------------------------------------------------------------------  Chemistries   Recent Labs Lab 04/28/15 1716 04/29/15 0811  NA  --  138  K  --  3.4*  CL  --  102  CO2  --  28  GLUCOSE  --  115*  BUN  --  61*  CREATININE  --  1.26*  CALCIUM  --  9.3  MG 2.7*  --    ------------------------------------------------------------------------------------------------------------------  Cardiac Enzymes  Recent Labs Lab 04/28/15 1018  TROPONINI <0.03   ------------------------------------------------------------------------------------------------------------------  RADIOLOGY:  No results found.   ASSESSMENT AND PLAN:   Principal Problem:   ARF (acute renal failure) (HCC) Active Problems:  Hyponatremia   Acute renal failure secondary to diuretics: Renal function improved, IV fluids will help her. ARF is secondary to ATN due to diuretics ,resolving;  discontinue IV fluids, continue to hold Lasix, Aldactone and watch today for the renal function will be without IV fluids. #2. Polytrim eye improved. #3 chronic atrial fibrillation: Rate controlled. Continue metoprolol, amiodarone , coumadin. Dietician consult   All the records are reviewed and case discussed with Care Management/Social Workerr. Management plans discussed with the patient, family and they are in agreement.  CODE STATUS: full  TOTAL TIME TAKING CARE OF THIS PATIENT: 35  minutes.   POSSIBLE D/C IN 1-2 DAYS, DEPENDING ON CLINICAL CONDITION.   Epifanio Lesches M.D on 04/29/2015 at 12:20 PM  Between 7am to 6pm - Pager - [phone]  After 6pm go to [email or web address] - password [email or web address] [facility]  [place] [facility]  Office  [phone]  CC: Primary care physician; Rusty Aus., MD

## 2015-04-29 NOTE — Plan of Care (Signed)
Problem: Food- and Nutrition-Related Knowledge Deficit (NB-1.1) Goal: Nutrition education Formal process to instruct or train a patient/client in a skill or to impart knowledge to help patients/clients voluntarily manage or modify food choices and eating behavior to maintain or improve health. Outcome: Completed/Met Date Met:  04/29/15 Nutrition Education Note  RD consulted for nutrition education regarding new onset CHF.  RD provided "Heart Failure Nutrition Therapy" handout from the Academy of [employer] to pt and her son. Reviewed patient's dietary recall; pt eats out a lot (son estimates 90% of meals are eaten out). Provided examples on ways to decrease sodium intake in diet even when eating out. Discouraged intake of processed foods and use of salt shaker. Encouraged fresh fruits and vegetables as well as whole grain sources of carbohydrates to maximize fiber intake.   RD discussed why it is important for patient to adhere to diet recommendations, and emphasized the role of fluids, foods to avoid, and importance of weighing self daily. Teach back method used.  Expect good compliance.  Kerman Passey [place], [place], LDN [phone] Pager

## 2015-04-29 NOTE — Progress Notes (Signed)
Spoke with Dr. Vianne Bulls regarding patient's BP 100/60 and scheduled a.m. Meds.  Orders received to hold amiodarone and metoprolol.

## 2015-04-30 LAB — PROTIME-INR
INR: 2.76
PROTHROMBIN TIME: 29.3 s — AB (ref 11.4–15.0)

## 2015-04-30 LAB — BASIC METABOLIC PANEL
ANION GAP: 7 (ref 5–15)
BUN: 43 mg/dL — AB (ref 6–20)
CO2: 27 mmol/L (ref 22–32)
CREATININE: 1.09 mg/dL — AB (ref 0.44–1.00)
Calcium: 10 mg/dL (ref 8.9–10.3)
Chloride: 105 mmol/L (ref 101–111)
GFR, EST AFRICAN AMERICAN: 55 mL/min — AB (ref >60)
GFR, EST NON AFRICAN AMERICAN: 48 mL/min — AB (ref >60)
Glucose, Bld: 116 mg/dL — ABNORMAL HIGH (ref 65–99)
POTASSIUM: 3.7 mmol/L (ref 3.5–5.1)
Sodium: 139 mmol/L (ref 135–145)

## 2015-04-30 MED ORDER — FUROSEMIDE 40 MG PO TABS: 40.0000 mg | ORAL_TABLET | Freq: Two times a day (BID) | ORAL | Status: DC

## 2015-04-30 NOTE — Discharge Instructions (Signed)
You were evaluated for possible dehydration and abnormal labs from yesterday, and found to have acute kidney failure which I suspect is due to dehydration from your recent increase in fluid pill.  You were given IV fluids here in the emergency department. We discussed staying in the hospital overnight versus recheck with your primary care physician with recheck of your laboratory studies on Monday.  Return to the emergency room for any new or worsening condition including nausea, vomiting, trouble breathing, dizziness or passing out, weakness, numbness, or altered mental status/any other symptoms concerning to you.   Acute Kidney Injury Acute kidney injury is any condition in which there is sudden (acute) damage to the kidneys. Acute kidney injury was previously known as acute kidney failure or acute renal failure. The kidneys are two organs that lie on either side of the spine between the middle of the back and the front of the abdomen. The kidneys:  Remove wastes and extra water from the blood.   Produce important hormones. These help keep bones strong, regulate blood pressure, and help create red blood cells.   Balance the fluids and chemicals in the blood and tissues. A small amount of kidney damage may not cause problems, but a large amount of damage may make it difficult or impossible for the kidneys to work the way they should. Acute kidney injury may develop into long-lasting (chronic) kidney disease. It may also develop into a life-threatening disease called end-stage kidney disease. Acute kidney injury can get worse very quickly, so it should be treated right away. Early treatment may prevent other kidney diseases from developing. CAUSES   A problem with blood flow to the kidneys. This may be caused by:   Blood loss.   Heart disease.   Severe burns.   Liver disease.  Direct damage to the kidneys. This may be caused by:  Some medicines.   A kidney infection.    Poisoning or consuming toxic substances.   A surgical wound.   A blow to the kidney area.   A problem with urine flow. This may be caused by:   Cancer.   Kidney stones.   An enlarged prostate. SIGNS AND SYMPTOMS   Swelling (edema) of the legs, ankles, or feet.   Tiredness (lethargy).   Nausea or vomiting.   Confusion.   Problems with urination, such as:   Painful or burning feeling during urination.   Decreased urine production.   Frequent accidents in children who are potty trained.   Bloody urine.   Muscle twitches and cramps.   Shortness of breath.   Seizures.   Chest pain or pressure. Sometimes, no symptoms are present. DIAGNOSIS Acute kidney injury may be detected and diagnosed by tests, including blood, urine, imaging, or kidney biopsy tests.  TREATMENT Treatment of acute kidney injury varies depending on the cause and severity of the kidney damage. In mild cases, no treatment may be needed. The kidneys may heal on their own. If acute kidney injury is more severe, your health care provider will treat the cause of the kidney damage, help the kidneys heal, and prevent complications from occurring. Severe cases may require a procedure to remove toxic wastes from the body (dialysis) or surgery to repair kidney damage. Surgery may involve:   Repair of a torn kidney.   Removal of an obstruction. HOME CARE INSTRUCTIONS  Follow your prescribed diet.  Take medicines only as directed by your health care provider.  Do not take any new medicines (prescription, over-the-counter,  or nutritional supplements) unless approved by your health care provider. Many medicines can worsen your kidney damage or may need to have the dose adjusted.   Keep all follow-up visits as directed by your health care provider. This is important.  Observe your condition to make sure you are healing as expected. SEEK IMMEDIATE MEDICAL CARE IF:  You are feeling  ill or have severe pain in the back or side.   Your symptoms return or you have new symptoms.  You have any symptoms of end-stage kidney disease. These include:   Persistent itchiness.   Loss of appetite.   Headaches.   Abnormally dark or light skin.  Numbness in the hands or feet.   Easy bruising.   Frequent hiccups.   Menstruation stops.   You have a fever.  You have increased urine production.  You have pain or bleeding when urinating. MAKE SURE YOU:   Understand these instructions.  Will watch your condition.  Will get help right away if you are not doing well or get worse.   This information is not intended to replace advice given to you by your health care provider. Make sure you discuss any questions you have with your health care provider.   Document Released: 12/31/2010 Document Revised: 07/08/2014 Document Reviewed: 02/14/2012 Elsevier Interactive Patient Education 2016 [employer].  Please take lasix and aldactone from tomorrow

## 2015-04-30 NOTE — Care Management Note (Signed)
Case Management Note  Patient Details  Name: [name] MRN: [id number] Date of Birth: August 03, 1934  Subjective/Objective:  Discussed possible need for a home health RN with Dr Vianne Bulls who reports no home health needed. [name] is to follow up with Cardiac Rehab and a Cardiologist.                   Action/Plan:   Expected Discharge Date:  04/28/15               Expected Discharge Plan:     In-House Referral:     Discharge planning Services     Post Acute Care Choice:    Choice offered to:     DME Arranged:    DME Agency:     HH Arranged:    [place] Agency:     Status of Service:     Medicare Important Message Given:    Date Medicare IM Given:    Medicare IM give by:    Date Additional Medicare IM Given:    Additional Medicare Important Message give by:     If discussed at [place] of Stay Meetings, dates discussed:    Additional Comments:  [name] A, RN 04/30/2015, 10:22 AM

## 2015-05-03 NOTE — Discharge Summary (Signed)
[name], is a 80 y.o. female  DOB 09-[name]-1935  MRN [id number].  Admission date:  04/28/2015  Admitting Physician  Demetrios Loll, MD  Discharge Date:  05/03/2015   Primary MD  Rusty Aus., MD  Recommendations for primary care physician for things to follow:  Follow-up with her primary cardiologist at [facility] in 1 week.    Admission Diagnosis  Dehydration [E86.0] Acute renal failure, unspecified acute renal failure type ([place]) [N17.9]   Discharge Diagnosis  Dehydration [E86.0] Acute renal failure, unspecified acute renal failure type ([place]) [N17.9]    Principal Problem:   ARF (acute renal failure) ([place]) Active Problems:   Hyponatremia      Past Medical History  Diagnosis Date  . Shortness of breath dyspnea   . Hypertension   . CHF (congestive heart failure) ([place])   . GERD (gastroesophageal reflux disease)   . Arthritis     Past Surgical History  Procedure Laterality Date  . Cardiac valve replacement    . Cardiac catheterization    . Pyloric stenosis    . Breast surgery    . Cataract extraction w/phaco Right 02/20/2015    Procedure: CATARACT EXTRACTION PHACO AND INTRAOCULAR LENS PLACEMENT (IOC);  Surgeon: Estill Cotta, MD;  Location: [facility] ORS;  Service: Ophthalmology;  Laterality: Right;  US01:31.2 AP 25.3% CDE40.13 Fluid lot # [id number]       History of present illness and  Hospital Course:     Kindly see H&P for history of present illness and admission details, please review complete Labs, Consult reports and Test reports for all details in brief  HPI  from the history and physical done on the day of admission  80 year old female with history of for congestive heart failure, hypertension, chronic atrial fibrillation came in because of generalized weakness. Found to have a acute renal failure  with ATN so she is admitted to medical floor.  Hospital Course    #1 acute renal failure due to ATN due to diuretics: Patient takes Zaroxolyn as needed for worsening pedal edema./Weight gain. But according to the family patient  non compliant with the salt intake and does eat outside a lot. Patient is seen by dietitian given education about CHF, low salt diet. Advised to avoid canned foods. Patient's BUN was 96 and creatinine 2.18 on admission with IV hydration and also holding the Lasix and Aldactone patient's BUN 43 creatinine 1.09 on 30th of October. Advised her to resume the Lasix, Aldactone  from next today on wards. #2 chronic afibrillation rate controlled continue metoprolol, amiodarone, Coumadin. #3 GERD continue PPIs #4 arthritis  Discharge Condition: Stable   Follow UP  Follow-up Information    Follow up with Rusty Aus., MD. Schedule an appointment as soon as possible for a visit in 3 days.   Specialty:  Internal Medicine   Contact information:   [place] [id number] [phone]       Follow up with follow up with her Primary cardiologist  In 1 week.        Discharge Instructions  and  Discharge Medications        Medication List    TAKE these medications        acetaminophen 500 MG tablet  Commonly known as:  TYLENOL  Take 500 mg by mouth every 6 (six) hours as needed for mild pain, moderate pain, fever or headache.     amiodarone 200 MG tablet  Commonly known as:  PACERONE  TAKE 1 TABLET EVERY DAY     aspirin EC 81 MG tablet  Take 81 mg by mouth daily.     furosemide 40 MG tablet  Commonly known as:  LASIX  Take 1 tablet (40 mg total) by mouth 2 (two) times daily.     metolazone 2.5 MG tablet  Commonly known as:  ZAROXOLYN  Take 2.5 mg by mouth daily as needed (for swelling).     metoprolol tartrate 25 MG tablet  Commonly known as:  LOPRESSOR  Take 25 mg by mouth 2 (two) times daily.      MULTI-VITAMINS Tabs  Take 1 tablet by mouth daily.     pantoprazole 40 MG tablet  Commonly known as:  PROTONIX  Take 40 mg by mouth daily.     spironolactone 25 MG tablet  Commonly known as:  ALDACTONE  Take 1 tablet by mouth 2 (two) times daily.     traMADol 50 MG tablet  Commonly known as:  ULTRAM  Take 25-50 mg by mouth 2 (two) times daily as needed for moderate pain or severe pain.     vitamin C 500 MG tablet  Commonly known as:  ASCORBIC ACID  Take 500 mg by mouth daily as needed (for cold symptoms).     warfarin 7.5 MG tablet  Commonly known as:  COUMADIN  Take 7.5 mg by mouth See admin instructions. Daily at bedtime on Monday, Tuesday, and Wednesday     warfarin 5 MG tablet  Commonly known as:  COUMADIN  Take 5 mg by mouth See admin instructions. Daily at bedtime on Thursday, Friday, Saturday, and Sunday          Diet and Activity recommendation: See Discharge Instructions above   Consults obtained -none   Major procedures and Radiology Reports - PLEASE review detailed and final reports for all details, in brief -      No results found.  Micro Results     No results found for this or any previous visit (from the past 240 hour(s)).     Today   Subjective:   [name] today has no headache,no chest abdominal pain,no new weakness tingling or numbness, feels much better wants to go home today.   Objective:   Blood pressure 115/91, pulse 116, temperature 98 F (36.7 C), temperature source Oral, resp. rate 16, height 5\' 5"  (1.651 m), weight 75.751 kg (167 lb), SpO2 95 %.  No intake or output data in the 24 hours ending 05/03/15 0926  Exam Awake Alert, Oriented x 3, No new F.N deficits, Normal affect Ruby.AT,PERRAL Supple Neck,No JVD, No cervical lymphadenopathy appriciated.  Symmetrical Chest wall movement, Good air movement bilaterally, CTAB RRR,No Gallops,Rubs or new Murmurs, No Parasternal Heave +ve B.Sounds, Abd Soft, Non  tender, No organomegaly appriciated, No rebound -guarding or rigidity. No Cyanosis, Clubbing or edema, No new Rash or bruise  Data Review   CBC w Diff:  Lab Results  Component Value Date   WBC 3.1* 04/29/2015   WBC 6.1 03/27/2012   HGB 13.3 04/29/2015   HGB 11.9* 03/27/2012   HCT 40.6 04/29/2015   HCT 36.0 03/27/2012   PLT 149* 04/29/2015   PLT 198 03/27/2012   LYMPHOPCT 13 04/28/2015   LYMPHOPCT 19.6 03/27/2012   MONOPCT 14 04/28/2015   MONOPCT 11.0 03/27/2012   EOSPCT 1 04/28/2015   EOSPCT 7.0 03/27/2012   BASOPCT 1 04/28/2015   BASOPCT 1.0 03/27/2012    CMP:  Lab Results  Component  Value Date   NA 139 04/30/2015   NA 143 03/27/2012   K 3.7 04/30/2015   K 3.9 02/20/2015   K 4.4 03/27/2012   CL 105 04/30/2015   CL 111* 03/27/2012   CO2 27 04/30/2015   CO2 21 03/27/2012   BUN 43* 04/30/2015   BUN 10 03/27/2012   CREATININE 1.09* 04/30/2015   CREATININE 0.84 03/27/2012   PROT 5.6* 03/25/2012   ALBUMIN 2.6* 03/25/2012   BILITOT 0.5 03/25/2012   ALKPHOS 66 03/25/2012   AST 28 03/25/2012   ALT 39 03/25/2012  .   Total Time in preparing paper work, data evaluation and todays exam - 45 minutes  Metzli Pollick M.D on 05/03/2015 at 9:26 AM    Note: This dictation was prepared with Dragon dictation along with smaller phrase technology. Any transcriptional errors that result from this process are unintentional.

## 2015-07-04 ENCOUNTER — Encounter: Payer: Self-pay | Admitting: General Surgery

## 2015-07-04 ENCOUNTER — Ambulatory Visit (INDEPENDENT_AMBULATORY_CARE_PROVIDER_SITE_OTHER): Payer: Medicare Other | Admitting: General Surgery

## 2015-07-04 VITALS — BP 108/70 | HR 70 | Resp 16 | Wt 166.0 lb

## 2015-07-04 DIAGNOSIS — S8012XA Contusion of left lower leg, initial encounter: Secondary | ICD-10-CM | POA: Diagnosis not present

## 2015-07-04 DIAGNOSIS — L089 Local infection of the skin and subcutaneous tissue, unspecified: Secondary | ICD-10-CM | POA: Diagnosis not present

## 2015-07-04 NOTE — H&P (Signed)
HPI  [name] is a 81 y.o. female. Here today for evaluation of left leg hematoma. She hit her left leg on a car door 2 weeks ago on the lateral side. She now has a hematoma on the left inner lower leg that she noticed around Christmas. She states it has been draining since about 06-28-15. They have been doing dressing changes three times a day. The patient denies any direct trauma to this area during the earlier injury to the lateral aspect of the leg.  The patient did suffer an injury to the upper medial aspect of the left calf years ago which has left an area of discoloration. This area is not involved with the present hematoma.  2015 was a busy year when she underwent back surgery, kidney stone surgery and replacement of a prosthetic mitral valve originally placed in 1994.  She is here today with her daughter.  I personally reviewed the patient's history.  HPI  Past Medical History   Diagnosis  Date   .  Shortness of breath dyspnea    .  Hypertension    .  CHF (congestive heart failure) ([place])    .  GERD (gastroesophageal reflux disease)    .  Arthritis     Past Surgical History   Procedure  Laterality  Date   .  Cardiac catheterization     .  Pyloric stenosis     .  Cataract extraction w/phaco  Right  02/20/2015     Procedure: CATARACT EXTRACTION PHACO AND INTRAOCULAR LENS PLACEMENT (IOC); Surgeon: Estill Cotta, MD; Location: [facility] ORS; Service: Ophthalmology; Laterality: Right; US01:31.2  AP 25.3%  CDE40.13  Fluid lot # [id number]   .  Breast surgery  Right  2005?     benign lump excision   .  Cardiac valve replacement   1994, 2016    Family History   Problem  Relation  Age of Onset   .  Stroke  Mother    .  Renal Disease  Father     Social History  Social History   Substance Use Topics   .  Smoking status:  Former Research scientist (life sciences)   .  Smokeless tobacco:  Never Used   .  Alcohol Use:  No      Comment: occasional    Allergies   Allergen  Reactions   .  Ace Inhibitors       Other reaction(s): Cough   .  Mercury      Other reaction(s): Unknown   .  Nickel    .  Silver  Dermatitis     Severe itching   .  Cephalexin  Rash   .  Penicillins  Rash    Current Outpatient Prescriptions   Medication  Sig  Dispense  Refill   .  acetaminophen (TYLENOL) 500 MG tablet  Take 500 mg by mouth every 6 (six) hours as needed for mild pain, moderate pain, fever or headache.     Marland Kitchen  amiodarone (PACERONE) 200 MG tablet  TAKE 1 TABLET EVERY DAY     .  aspirin EC 81 MG tablet  Take 81 mg by mouth daily.     .  furosemide (LASIX) 40 MG tablet  Take 1 tablet (40 mg total) by mouth 2 (two) times daily.  30 tablet  0   .  metolazone (ZAROXOLYN) 2.5 MG tablet  Take 2.5 mg by mouth daily as needed (for swelling).     .  metoprolol tartrate (  LOPRESSOR) 25 MG tablet  Take 25 mg by mouth 2 (two) times daily.     .  Multiple Vitamin (MULTI-VITAMINS) TABS  Take 1 tablet by mouth daily.     .  pantoprazole (PROTONIX) 40 MG tablet  Take 40 mg by mouth daily.     Marland Kitchen  spironolactone (ALDACTONE) 25 MG tablet  Take 1 tablet by mouth 2 (two) times daily.     .  traMADol (ULTRAM) 50 MG tablet  Take 25-50 mg by mouth 2 (two) times daily as needed for moderate pain or severe pain.     .  vitamin C (ASCORBIC ACID) 500 MG tablet  Take 500 mg by mouth daily as needed (for cold symptoms).     .  warfarin (COUMADIN) 5 MG tablet  Take 5 mg by mouth See admin instructions. Daily at bedtime on Thursday, Friday, Saturday, and Sunday     .  warfarin (COUMADIN) 7.5 MG tablet  Take 7.5 mg by mouth See admin instructions. Daily at bedtime on Monday, Tuesday, and Wednesday      No current facility-administered medications for this visit.    Review of Systems  Review of Systems  Constitutional: Negative.  Respiratory: Negative.  Cardiovascular: Negative.  Neurological: Positive for dizziness.   Blood pressure 108/70, pulse 70, resp. rate 16, weight 166 lb (75.297 kg).  Physical Exam  Physical Exam    Constitutional: She is oriented to person, place, and time. She appears well-developed and well-nourished.  HENT:  Mouth/Throat: Oropharynx is clear and moist.  Eyes: Conjunctivae are normal. No scleral icterus.  Neck: Neck supple.  Cardiovascular: Normal rate. An irregular rhythm present.  Pulses:  Dorsalis pedis pulses are 2+ on the left side.  Pulmonary/Chest: Effort normal and breath sounds normal.  Musculoskeletal:  Legs: Lymphadenopathy:  She has no cervical adenopathy.  Neurological: She is alert and oriented to person, place, and time.  Skin: Skin is warm and dry.  9 x 12 cm hematoma LLE  Psychiatric: Her behavior is normal.   Data Reviewed  PT and INR completed earlier today at her PCP office showed an elevated INR 3.7. This is lower than 4.1 a few weeks ago.  05/01/2015 creatinine: 1.2. Estimated GFR 44. Normal electrolytes that day.  Assessment   Large hematoma of the left lower extremity  Plan    the area is sensitive, and I don't believe the patient would tolerate unroofing under local anesthesia. The area is so superficial that spontaneous resolution is unlikely, and the large volume probably 300 mL of clot is more likely to be infected.  Patient is scheduled for surgery at [facility] on 07/05/15. She will pre admit at the hospital on 07/05/15 at 9:00 am. Patient is aware of date, time, and instructions.  PCP: [name]  This information has been scribed by Karie Fetch RNBC.  Robert Bellow

## 2015-07-04 NOTE — Progress Notes (Signed)
Patient ID: [name], female   DOB: [birth year], 81 y.o.   MRN: [id number]  Chief Complaint  Patient presents with  . Other    hematoma    HPI [name] is a 81 y.o. female.  Here today for evaluation of left leg hematoma. She hit her left leg on a car door 2 weeks ago on the lateral side. She now has a hematoma on the left inner lower leg that she noticed around Christmas. She states it has been draining since about 06-28-15. They have been doing dressing changes three times a day. The patient denies any direct trauma to this area during the earlier injury to the lateral aspect of the leg. The patient did suffer an injury to the upper medial aspect of the left calf years ago which has left an area of discoloration. This area is not involved with the present hematoma.  2015 was a busy year when she underwent back surgery, kidney stone surgery and replacement of a prosthetic mitral valve originally placed in 1994. She is here today with her daughter. I personally reviewed the patient's history. HPI  Past Medical History  Diagnosis Date  . Shortness of breath dyspnea   . Hypertension   . CHF (congestive heart failure) ([place])   . GERD (gastroesophageal reflux disease)   . Arthritis     Past Surgical History  Procedure Laterality Date  . Cardiac catheterization    . Pyloric stenosis    . Cataract extraction w/phaco Right 02/20/2015    Procedure: CATARACT EXTRACTION PHACO AND INTRAOCULAR LENS PLACEMENT (IOC);  Surgeon: Estill Cotta, MD;  Location: [facility] ORS;  Service: Ophthalmology;  Laterality: Right;  US01:31.2 AP 25.3% CDE40.13 Fluid lot # [id number]  . Breast surgery Right 2005?    benign lump excision  . Cardiac valve replacement  1994, 2016    Family History  Problem Relation Age of Onset  . Stroke Mother   . Renal Disease Father     Social History Social History  Substance Use Topics  . Smoking status: Former Research scientist (life sciences)  . Smokeless tobacco: Never Used   . Alcohol Use: No     Comment: occasional    Allergies  Allergen Reactions  . Ace Inhibitors     Other reaction(s): Cough  . Mercury     Other reaction(s): Unknown  . Nickel   . Silver Dermatitis    Severe itching  . Cephalexin Rash  . Penicillins Rash    Current Outpatient Prescriptions  Medication Sig Dispense Refill  . acetaminophen (TYLENOL) 500 MG tablet Take 500 mg by mouth every 6 (six) hours as needed for mild pain, moderate pain, fever or headache.    Marland Kitchen amiodarone (PACERONE) 200 MG tablet TAKE 1 TABLET EVERY DAY    . aspirin EC 81 MG tablet Take 81 mg by mouth daily.     . furosemide (LASIX) 40 MG tablet Take 1 tablet (40 mg total) by mouth 2 (two) times daily. 30 tablet 0  . metolazone (ZAROXOLYN) 2.5 MG tablet Take 2.5 mg by mouth daily as needed (for swelling).     . metoprolol tartrate (LOPRESSOR) 25 MG tablet Take 25 mg by mouth 2 (two) times daily.    . Multiple Vitamin (MULTI-VITAMINS) TABS Take 1 tablet by mouth daily.     . pantoprazole (PROTONIX) 40 MG tablet Take 40 mg by mouth daily.     Marland Kitchen spironolactone (ALDACTONE) 25 MG tablet Take 1 tablet by mouth 2 (two) times daily.    Marland Kitchen  traMADol (ULTRAM) 50 MG tablet Take 25-50 mg by mouth 2 (two) times daily as needed for moderate pain or severe pain.     . vitamin C (ASCORBIC ACID) 500 MG tablet Take 500 mg by mouth daily as needed (for cold symptoms).     . warfarin (COUMADIN) 5 MG tablet Take 5 mg by mouth See admin instructions. Daily at bedtime on Thursday, Friday, Saturday, and Sunday    . warfarin (COUMADIN) 7.5 MG tablet Take 7.5 mg by mouth See admin instructions. Daily at bedtime on Monday, Tuesday, and Wednesday     No current facility-administered medications for this visit.    Review of Systems Review of Systems  Constitutional: Negative.   Respiratory: Negative.   Cardiovascular: Negative.   Neurological: Positive for dizziness.    Blood pressure 108/70, pulse 70, resp. rate 16, weight 166 lb  (75.297 kg).  Physical Exam Physical Exam  Constitutional: She is oriented to person, place, and time. She appears well-developed and well-nourished.  HENT:  Mouth/Throat: Oropharynx is clear and moist.  Eyes: Conjunctivae are normal. No scleral icterus.  Neck: Neck supple.  Cardiovascular: Normal rate.  An irregular rhythm present.  Pulses:      Dorsalis pedis pulses are 2+ on the left side.  Pulmonary/Chest: Effort normal and breath sounds normal.  Musculoskeletal:       Legs: Lymphadenopathy:    She has no cervical adenopathy.  Neurological: She is alert and oriented to person, place, and time.  Skin: Skin is warm and dry.  9 x 12 cm hematoma LLE  Psychiatric: Her behavior is normal.    Data Reviewed PT and INR completed earlier today at her PCP office showed an elevated INR 3.7. This is lower than 4.1 a few weeks ago. 05/01/2015 creatinine: 1.2. Estimated GFR 44. Normal electrolytes that day. Assessment    Large hematoma of the left lower extremity   Plan        the area is sensitive, and I don't believe the patient would tolerate unroofing under local anesthesia. The area is so superficial that spontaneous resolution is unlikely, and the large volume probably 300 mL of clot is more likely to be infected.  Patient is scheduled for surgery at [facility] on 07/05/15. She will pre admit at the hospital on 07/05/15 at 9:00 am. Patient is aware of date, time, and instructions.   PCP:  [name]  This information has been scribed by Karie Fetch RNBC.   Robert Bellow 07/04/2015, 8:08 PM

## 2015-07-04 NOTE — Patient Instructions (Addendum)
The patient is aware to call back for any questions or concerns.  Patient is scheduled for surgery at [facility] on 07/05/15. She will pre admit at the hospital on 07/05/15 at 9:00 am. Patient is aware of date, time, and instructions.

## 2015-07-05 ENCOUNTER — Ambulatory Visit
Admission: RE | Admit: 2015-07-05 | Discharge: 2015-07-05 | Disposition: A | Discharge status: Home / Self Care | Payer: Medicare Other | Source: Ambulatory Visit | Attending: General Surgery | Admitting: General Surgery

## 2015-07-05 ENCOUNTER — Encounter
Admission: RE | Disposition: A | Discharge status: Home / Self Care | Payer: Self-pay | Source: Ambulatory Visit | Attending: General Surgery

## 2015-07-05 ENCOUNTER — Ambulatory Visit: Payer: Medicare Other | Admitting: Anesthesiology

## 2015-07-05 ENCOUNTER — Encounter: Payer: Self-pay | Admitting: *Deleted

## 2015-07-05 DIAGNOSIS — Z952 Presence of prosthetic heart valve: Secondary | ICD-10-CM | POA: Diagnosis not present

## 2015-07-05 DIAGNOSIS — W228XXA Striking against or struck by other objects, initial encounter: Secondary | ICD-10-CM | POA: Diagnosis not present

## 2015-07-05 DIAGNOSIS — Y9389 Activity, other specified: Secondary | ICD-10-CM | POA: Insufficient documentation

## 2015-07-05 DIAGNOSIS — I10 Essential (primary) hypertension: Secondary | ICD-10-CM | POA: Insufficient documentation

## 2015-07-05 DIAGNOSIS — Z880 Allergy status to penicillin: Secondary | ICD-10-CM | POA: Diagnosis not present

## 2015-07-05 DIAGNOSIS — Z7901 Long term (current) use of anticoagulants: Secondary | ICD-10-CM | POA: Diagnosis not present

## 2015-07-05 DIAGNOSIS — Z79899 Other long term (current) drug therapy: Secondary | ICD-10-CM | POA: Diagnosis not present

## 2015-07-05 DIAGNOSIS — I83892 Varicose veins of left lower extremities with other complications: Secondary | ICD-10-CM | POA: Insufficient documentation

## 2015-07-05 DIAGNOSIS — I509 Heart failure, unspecified: Secondary | ICD-10-CM | POA: Diagnosis not present

## 2015-07-05 DIAGNOSIS — Z87891 Personal history of nicotine dependence: Secondary | ICD-10-CM | POA: Insufficient documentation

## 2015-07-05 DIAGNOSIS — Z888 Allergy status to other drugs, medicaments and biological substances status: Secondary | ICD-10-CM | POA: Diagnosis not present

## 2015-07-05 DIAGNOSIS — Z9841 Cataract extraction status, right eye: Secondary | ICD-10-CM | POA: Diagnosis not present

## 2015-07-05 DIAGNOSIS — Z881 Allergy status to other antibiotic agents status: Secondary | ICD-10-CM | POA: Diagnosis not present

## 2015-07-05 DIAGNOSIS — Z841 Family history of disorders of kidney and ureter: Secondary | ICD-10-CM | POA: Diagnosis not present

## 2015-07-05 DIAGNOSIS — K219 Gastro-esophageal reflux disease without esophagitis: Secondary | ICD-10-CM | POA: Diagnosis not present

## 2015-07-05 DIAGNOSIS — M1990 Unspecified osteoarthritis, unspecified site: Secondary | ICD-10-CM | POA: Diagnosis not present

## 2015-07-05 DIAGNOSIS — Z823 Family history of stroke: Secondary | ICD-10-CM | POA: Diagnosis not present

## 2015-07-05 DIAGNOSIS — M7981 Nontraumatic hematoma of soft tissue: Secondary | ICD-10-CM

## 2015-07-05 DIAGNOSIS — S8012XA Contusion of left lower leg, initial encounter: Secondary | ICD-10-CM | POA: Diagnosis not present

## 2015-07-05 DIAGNOSIS — R0602 Shortness of breath: Secondary | ICD-10-CM | POA: Insufficient documentation

## 2015-07-05 HISTORY — PX: IRRIGATION AND DEBRIDEMENT HEMATOMA: SHX5254

## 2015-07-05 SURGERY — IRRIGATION AND DEBRIDEMENT HEMATOMA
Anesthesia: General | Site: Leg Lower | Laterality: Left

## 2015-07-05 MED ORDER — FENTANYL CITRATE (PF) 100 MCG/2ML IJ SOLN
INTRAMUSCULAR | Status: DC | PRN
  Administered 2015-07-05: 50 ug via INTRAVENOUS

## 2015-07-05 MED ORDER — PROPOFOL 10 MG/ML IV BOLUS
INTRAVENOUS | Status: DC | PRN
  Administered 2015-07-05: 50 mg via INTRAVENOUS

## 2015-07-05 MED ORDER — FENTANYL CITRATE (PF) 100 MCG/2ML IJ SOLN
INTRAMUSCULAR | Status: AC
  Filled 2015-07-05: qty 2

## 2015-07-05 MED ORDER — ACETAMINOPHEN 10 MG/ML IV SOLN
1000.0000 mg | Freq: Once | INTRAVENOUS | Status: AC
  Administered 2015-07-05: 1000 mg via INTRAVENOUS

## 2015-07-05 MED ORDER — ACETAMINOPHEN 10 MG/ML IV SOLN
INTRAVENOUS | Status: AC
  Filled 2015-07-05: qty 100

## 2015-07-05 MED ORDER — OXYCODONE HCL 5 MG PO TABS
ORAL_TABLET | ORAL | Status: AC
  Filled 2015-07-05: qty 1

## 2015-07-05 MED ORDER — SODIUM BICARBONATE 4 % IV SOLN
INTRAVENOUS | Status: AC
  Filled 2015-07-05: qty 5

## 2015-07-05 MED ORDER — OXYCODONE HCL 5 MG PO TABS
5.0000 mg | ORAL_TABLET | Freq: Once | ORAL | Status: AC | PRN
  Administered 2015-07-05: 5 mg via ORAL

## 2015-07-05 MED ORDER — PROPOFOL 500 MG/50ML IV EMUL
INTRAVENOUS | Status: DC | PRN
  Administered 2015-07-05: 75 ug/kg/min via INTRAVENOUS

## 2015-07-05 MED ORDER — LIDOCAINE HCL (PF) 1 % IJ SOLN
INTRAMUSCULAR | Status: AC
  Filled 2015-07-05: qty 30

## 2015-07-05 MED ORDER — HYDROCODONE-ACETAMINOPHEN 5-325 MG PO TABS: 1.0000 | ORAL_TABLET | ORAL | Status: DC | PRN

## 2015-07-05 MED ORDER — BUPIVACAINE HCL (PF) 0.5 % IJ SOLN
INTRAMUSCULAR | Status: AC
  Filled 2015-07-05: qty 30

## 2015-07-05 MED ORDER — FENTANYL CITRATE (PF) 100 MCG/2ML IJ SOLN
25.0000 ug | INTRAMUSCULAR | Status: DC | PRN
  Administered 2015-07-05 (×3): 50 ug via INTRAVENOUS

## 2015-07-05 MED ORDER — OXYCODONE HCL 5 MG/5ML PO SOLN: 5.0000 mg | Freq: Once | ORAL | Status: AC | PRN

## 2015-07-05 MED ORDER — LACTATED RINGERS IV SOLN
INTRAVENOUS | Status: DC
  Administered 2015-07-05: 10:00:00 via INTRAVENOUS

## 2015-07-05 MED ORDER — MIDAZOLAM HCL 2 MG/2ML IJ SOLN
INTRAMUSCULAR | Status: DC | PRN
  Administered 2015-07-05: 1 mg via INTRAVENOUS

## 2015-07-05 SURGICAL SUPPLY — 33 items
BANDAGE ELASTIC 4 CLIP ST LF (GAUZE/BANDAGES/DRESSINGS) ×3 IMPLANT
BANDAGE ELASTIC 6 LF NS (GAUZE/BANDAGES/DRESSINGS) IMPLANT
BLADE SURG 15 STRL SS SAFETY (BLADE) ×3 IMPLANT
BNDG GAUZE 4.5X4.1 6PLY STRL (MISCELLANEOUS) ×3 IMPLANT
CANISTER SUCT 1200ML W/VALVE (MISCELLANEOUS) ×3 IMPLANT
CHLORAPREP W/TINT 26ML (MISCELLANEOUS) ×3 IMPLANT
CLOSURE WOUND 1/2 X4 (GAUZE/BANDAGES/DRESSINGS)
DRAPE LAPAROTOMY 100X77 ABD (DRAPES) ×3 IMPLANT
DRESSING TELFA 4X3 1S ST N-ADH (GAUZE/BANDAGES/DRESSINGS) IMPLANT
DRSG EMULSION OIL 3X8 NADH (GAUZE/BANDAGES/DRESSINGS) ×6 IMPLANT
ELECT CAUTERY BLADE TIP 2.5 (TIP) ×3
ELECTRODE CAUTERY BLDE TIP 2.5 (TIP) ×1 IMPLANT
GAUZE FLUFF 18X24 1PLY STRL (GAUZE/BANDAGES/DRESSINGS) ×3 IMPLANT
GAUZE SPONGE 4X4 12PLY STRL (GAUZE/BANDAGES/DRESSINGS) ×3 IMPLANT
GLOVE BIO SURGEON STRL SZ7.5 (GLOVE) ×3 IMPLANT
GLOVE INDICATOR 8.0 STRL GRN (GLOVE) ×3 IMPLANT
GOWN STRL REUS W/ TWL LRG LVL3 (GOWN DISPOSABLE) ×2 IMPLANT
GOWN STRL REUS W/TWL LRG LVL3 (GOWN DISPOSABLE) ×4
KIT RM TURNOVER STRD PROC AR (KITS) ×3 IMPLANT
LABEL OR SOLS (LABEL) ×3 IMPLANT
NDL SAFETY 22GX1.5 (NEEDLE) ×3 IMPLANT
NEEDLE HYPO 25X1 1.5 SAFETY (NEEDLE) ×3 IMPLANT
NS IRRIG 1000ML POUR BTL (IV SOLUTION) ×3 IMPLANT
PACK BASIN MINOR ARMC (MISCELLANEOUS) ×3 IMPLANT
PAD GROUND ADULT SPLIT (MISCELLANEOUS) ×3 IMPLANT
STRIP CLOSURE SKIN 1/2X4 (GAUZE/BANDAGES/DRESSINGS) IMPLANT
SUT VIC AB 2-0 CT1 27 (SUTURE)
SUT VIC AB 2-0 CT1 TAPERPNT 27 (SUTURE) IMPLANT
SUT VIC AB 2-0 CT2 27 (SUTURE) IMPLANT
SUT VIC AB 4-0 FS2 27 (SUTURE) IMPLANT
SWAB CULTURE AMIES ANAERIB BLU (MISCELLANEOUS) IMPLANT
SWABSTK COMLB BENZOIN TINCTURE (MISCELLANEOUS) IMPLANT
SYR CONTROL 10ML (SYRINGE) ×6 IMPLANT

## 2015-07-05 NOTE — OR Nursing (Signed)
Dr Terri Piedra in to see pt.

## 2015-07-05 NOTE — Anesthesia Postprocedure Evaluation (Signed)
Anesthesia Post Note  Patient: [name]  Procedure(s) Performed: Procedure(s) (LRB): IRRIGATION AND DEBRIDEMENT HEMATOMA (Left)  Patient location during evaluation: PACU Anesthesia Type: General Level of consciousness: awake and alert Pain management: pain level controlled Vital Signs Assessment: post-procedure vital signs reviewed and stable Respiratory status: spontaneous breathing, nonlabored ventilation, respiratory function stable and patient connected to nasal cannula oxygen Cardiovascular status: blood pressure returned to baseline and stable Postop Assessment: no signs of nausea or vomiting Anesthetic complications: no    Last Vitals:  Filed Vitals:   07/05/15 1249 07/05/15 1300  BP: 103/77 101/56  Pulse: 42 59  Temp: 36.1 C   Resp: 17 16    Last Pain:  Filed Vitals:   07/05/15 1301  PainSc: 5                  Precious Haws Piscitello

## 2015-07-05 NOTE — OR Nursing (Signed)
Patient had to be awakened to ask how her pain was and she said it was not any better.

## 2015-07-05 NOTE — Anesthesia Preprocedure Evaluation (Addendum)
Anesthesia Evaluation  Patient identified by MRN, date of birth, ID band Patient awake    Reviewed: Allergy & Precautions, H&P , NPO status , Patient's Chart, lab work & pertinent test results  History of Anesthesia Complications Negative for: history of anesthetic complications  Airway Mallampati: III  TM Distance: >3 FB Neck ROM: limited    Dental no notable dental hx. (+) Teeth Intact   Pulmonary shortness of breath, former smoker,    Pulmonary exam normal breath sounds clear to auscultation       Cardiovascular Exercise Tolerance: Poor hypertension, (-) angina+CHF and + DOE  Normal cardiovascular exam+ dysrhythmias Atrial Fibrillation II Rhythm:regular Rate:Normal     Neuro/Psych negative neurological ROS  negative psych ROS   GI/Hepatic Neg liver ROS, GERD  Controlled,  Endo/Other  negative endocrine ROS  Renal/GU Renal disease  negative genitourinary   Musculoskeletal  (+) Arthritis ,   Abdominal   Peds  Hematology  (+) Blood dyscrasia, ,   Anesthesia Other Findings Past Medical History:   Shortness of breath dyspnea                                  Hypertension                                                 CHF (congestive heart failure) (HCC)                         GERD (gastroesophageal reflux disease)                       Arthritis                                                   Past Surgical History:   CARDIAC CATHETERIZATION                                       pyloric stenosis                                              CATARACT EXTRACTION W/PHACO                     Right 02/20/2015      Comment:Procedure: CATARACT EXTRACTION PHACO AND               INTRAOCULAR LENS PLACEMENT (IOC);  Surgeon:               Estill Cotta, MD;  Location: [facility] ORS;                Service: Ophthalmology;  Laterality: Right;                US01:31.2 AP 25.3% CDE40.13 Fluid lot #                [id number]   BREAST SURGERY  Right 2005?          Comment:benign lump excision   CARDIAC VALVE REPLACEMENT                        1994, 2016     Reproductive/Obstetrics negative OB ROS                         Anesthesia Physical Anesthesia Plan  ASA: III  Anesthesia Plan: General, MAC and General LMA   Post-op Pain Management:    Induction:   Airway Management Planned:   Additional Equipment:   Intra-op Plan:   Post-operative Plan:   Informed Consent: I have reviewed the patients History and Physical, chart, labs and discussed the procedure including the risks, benefits and alternatives for the proposed anesthesia with the patient or authorized representative who has indicated his/her understanding and acceptance.   Dental Advisory Given  Plan Discussed with: Anesthesiologist, CRNA and Surgeon  Anesthesia Plan Comments:        Anesthesia Quick Evaluation

## 2015-07-05 NOTE — OR Nursing (Signed)
Dr Bary Castilla and Piscitella aware of pt lab result INR 3.7 yesterday.

## 2015-07-05 NOTE — Discharge Instructions (Signed)
AMBULATORY SURGERY  DISCHARGE INSTRUCTIONS   1) The drugs that you were given will stay in your system until tomorrow so for the next 24 hours you should not:  A) Drive an automobile B) Make any legal decisions C) Drink any alcoholic beverage   2) You may resume regular meals tomorrow.  Today it is better to start with liquids and gradually work up to solid foods.  You may eat anything you prefer, but it is better to start with liquids, then soup and crackers, and gradually work up to solid foods.   3) Please notify your doctor immediately if you have any unusual bleeding, trouble breathing, redness and pain at the surgery site, drainage, fever, or pain not relieved by medication.    4) Additional Instructions:        Please contact your physician with any problems or Same Day Surgery at [phone], Monday through Friday 6 am to 4 pm, or [place] at [facility] number at [phone]

## 2015-07-05 NOTE — Transfer of Care (Signed)
Immediate Anesthesia Transfer of Care Note  Patient: [name]  Procedure(s) Performed: Procedure(s): IRRIGATION AND DEBRIDEMENT HEMATOMA (Left)  Patient Location: PACU  Anesthesia Type:General  Level of Consciousness: sedated  Airway & Oxygen Therapy: Patient Spontanous Breathing and Patient connected to face mask oxygen  Post-op Assessment: Report given to RN and Post -op Vital signs reviewed and stable  Post vital signs: Reviewed and stable  Last Vitals:  Filed Vitals:   07/05/15 0934 07/05/15 1153  BP: 109/61 109/64  Pulse: 69 58  Temp: 36.3 C 36.3 C  Resp: 20 26    Complications: No apparent anesthesia complications

## 2015-07-05 NOTE — Anesthesia Procedure Notes (Signed)
Date/Time: 07/05/2015 11:20 AM Performed by: Doreen Salvage Pre-anesthesia Checklist: Patient identified, Emergency Drugs available, Suction available and Patient being monitored Patient Re-evaluated:Patient Re-evaluated prior to inductionOxygen Delivery Method: Nasal cannula Intubation Type: IV induction Dental Injury: Teeth and Oropharynx as per pre-operative assessment  Comments: Nasal cannula with etCO2 monitoring

## 2015-07-05 NOTE — Op Note (Signed)
Preoperative diagnosis: Left lower extremity hematoma.  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  Operative procedure: Debridement left leg, application of compressive dressing.  Operative surgeon: Ollen Bowl, M.D.  Anesthesia: Monitored anesthesia care.  Estimated blood loss: None.  Medical note: This 81 year old woman traumatized lateral aspect of her left mid calf 2 weeks ago on the car door. A 2.5 x 3 cm abrasion occurred. Shortly after this a purple blister arose on the medial aspect of the calf trauma to this area. Photographs provided by the daughter showed a dome-shaped lesion consistent with a ruptured varix. This increased in size over the last 2 weeks until she presented with a fist sized fluctuant mass consistent with a hematoma with marked thinning of the overlying skin. She's brought to the operative this time for debridement.  Operative note: With the patient comfortably supine on the operating table the leg was elevated and supported with pillows and an ankle brace. The area was prepped with Betadine solution enhanced and draped. The thinned overlying skin was sharply debrided and a large fist-sized clot with a smaller amount of liquid hematoma evacuated. The underlying dermal tissue was healthy. Within this area measuring approximately 10 x 12 cm there was a 2.5 cm ulcerated crater proximally a centimeter in depth. This was also filled with old clot and may represent the site of the original varix rupture. No bleeding was noted. The area was irrigated with saline.  The lateral aspect of the calf was gently debrided with a sponge. Adaptic gauze was placed over both wounds followed by fluff gauze, Kerlix and an Ace wrap.  The patient tolerated the procedure well and was brought to the recovery room in stable condition. I

## 2015-07-06 ENCOUNTER — Telehealth: Payer: Self-pay | Admitting: *Deleted

## 2015-07-06 NOTE — Telephone Encounter (Signed)
She called to let us know that she has been itching all over since starting the oxycodone for pain and feeling nauseated when she takes it as well. She is aware MD is currently not in the office and that a new RX would need to be written  To be picked up, pt agrees. She will take benadryl to treat itching and tylenol for pain until new RX can be obtained (she can't take ibuprofen products).

## 2015-07-06 NOTE — Telephone Encounter (Signed)
I talked with Dr Bary Castilla, recommends Tramadol 50 mg #30 1 q4h prn to take for the pain. She already has this medication at home and she will try it and call for any further issues.

## 2015-07-07 ENCOUNTER — Ambulatory Visit (INDEPENDENT_AMBULATORY_CARE_PROVIDER_SITE_OTHER): Payer: Medicare Other | Admitting: General Surgery

## 2015-07-07 VITALS — BP 116/70 | HR 68 | Resp 12 | Ht 68.0 in | Wt 171.0 lb

## 2015-07-07 DIAGNOSIS — L089 Local infection of the skin and subcutaneous tissue, unspecified: Secondary | ICD-10-CM

## 2015-07-07 DIAGNOSIS — S8012XA Contusion of left lower leg, initial encounter: Secondary | ICD-10-CM | POA: Diagnosis not present

## 2015-07-07 NOTE — Progress Notes (Signed)
Patient ID: [name], female   DOB: 24-Oct-1934, 81 y.o.   MRN: [id number]  Chief Complaint  Patient presents with  . Follow-up    HPI [name] is a 81 y.o. female here today for her follow up debridement of a large hematoma on the inferior medial aspect left calf completed on 07/04/2014. She is accompanied today by her daughter.  I reviewed the patient's history. HPI  Past Medical History  Diagnosis Date  . Shortness of breath dyspnea   . Hypertension   . CHF (congestive heart failure) ([place])   . GERD (gastroesophageal reflux disease)   . Arthritis     Past Surgical History  Procedure Laterality Date  . Cardiac catheterization    . Pyloric stenosis    . Cataract extraction w/phaco Right 02/20/2015    Procedure: CATARACT EXTRACTION PHACO AND INTRAOCULAR LENS PLACEMENT (IOC);  Surgeon: Estill Cotta, MD;  Location: [facility] ORS;  Service: Ophthalmology;  Laterality: Right;  US01:31.2 AP 25.3% CDE40.13 Fluid lot # [id number]  . Breast surgery Right 2005?    benign lump excision  . Cardiac valve replacement  1994, 2016  . Irrigation and debridement hematoma Left 07/05/2015    Procedure: IRRIGATION AND DEBRIDEMENT HEMATOMA;  Surgeon: Robert Bellow, MD;  Location: [facility] ORS;  Service: General;  Laterality: Left;    Family History  Problem Relation Age of Onset  . Stroke Mother   . Renal Disease Father     Social History Social History  Substance Use Topics  . Smoking status: Former Research scientist (life sciences)  . Smokeless tobacco: Never Used  . Alcohol Use: No     Comment: occasional    Allergies  Allergen Reactions  . Ace Inhibitors     Other reaction(s): Cough  . Hydrocodone Itching and Nausea Only  . Mercury     Other reaction(s): Unknown  . Nickel   . Silver Dermatitis    Severe itching  . Cephalexin Rash  . Penicillins Rash    Current Outpatient Prescriptions  Medication Sig Dispense Refill  . acetaminophen (TYLENOL) 500 MG tablet Take 500 mg by mouth  every 6 (six) hours as needed for mild pain, moderate pain, fever or headache.    Marland Kitchen amiodarone (PACERONE) 200 MG tablet TAKE 1 TABLET EVERY DAY    . aspirin EC 81 MG tablet Take 81 mg by mouth daily.     . furosemide (LASIX) 40 MG tablet Take 1 tablet (40 mg total) by mouth 2 (two) times daily. 30 tablet 0  . metolazone (ZAROXOLYN) 2.5 MG tablet Take 2.5 mg by mouth daily as needed (for swelling).     . metoprolol tartrate (LOPRESSOR) 25 MG tablet Take 25 mg by mouth 2 (two) times daily.    . Multiple Vitamin (MULTI-VITAMINS) TABS Take 1 tablet by mouth daily.     . pantoprazole (PROTONIX) 40 MG tablet Take 40 mg by mouth daily.     Marland Kitchen spironolactone (ALDACTONE) 25 MG tablet Take 1 tablet by mouth 2 (two) times daily.    . traMADol (ULTRAM) 50 MG tablet Take 25-50 mg by mouth 2 (two) times daily as needed for moderate pain or severe pain.     . vitamin C (ASCORBIC ACID) 500 MG tablet Take 500 mg by mouth daily as needed (for cold symptoms).      No current facility-administered medications for this visit.    Review of Systems Review of Systems  Constitutional: Negative.   Respiratory: Negative.   Cardiovascular: Negative.  Blood pressure 116/70, pulse 68, resp. rate 12, height 5\' 8"  (1.727 m), weight 171 lb (77.565 kg).  Physical Exam Physical Exam  Musculoskeletal:       Legs:     Assessment    Rapid improvement status post debridement.    Plan    The area was cleansed with saline and a new Adaptic dressing with bacitracin applied followed by compressive wrap.  Follow-up examination for dressing change in 3 days.      PCP:  Sabra Heck,  This information has been scribed by Gaspar Cola CMA.  Robert Bellow 07/08/2015, 10:22 AM

## 2015-07-09 ENCOUNTER — Ambulatory Visit: Payer: Medicare Other | Admitting: General Surgery

## 2015-07-09 DIAGNOSIS — S8012XA Contusion of left lower leg, initial encounter: Secondary | ICD-10-CM | POA: Diagnosis not present

## 2015-07-09 DIAGNOSIS — L089 Local infection of the skin and subcutaneous tissue, unspecified: Secondary | ICD-10-CM

## 2015-07-10 ENCOUNTER — Ambulatory Visit: Payer: Medicare Other | Admitting: General Surgery

## 2015-07-11 ENCOUNTER — Ambulatory Visit: Payer: Medicare Other | Admitting: General Surgery

## 2015-07-12 ENCOUNTER — Encounter: Payer: Self-pay | Admitting: General Surgery

## 2015-07-12 ENCOUNTER — Ambulatory Visit (INDEPENDENT_AMBULATORY_CARE_PROVIDER_SITE_OTHER): Payer: Medicare Other | Admitting: General Surgery

## 2015-07-12 VITALS — BP 122/68 | HR 70 | Resp 12 | Ht 68.0 in | Wt 163.0 lb

## 2015-07-12 DIAGNOSIS — S8010XA Contusion of unspecified lower leg, initial encounter: Secondary | ICD-10-CM | POA: Insufficient documentation

## 2015-07-12 DIAGNOSIS — L089 Local infection of the skin and subcutaneous tissue, unspecified: Secondary | ICD-10-CM

## 2015-07-12 DIAGNOSIS — S8012XA Contusion of left lower leg, initial encounter: Secondary | ICD-10-CM | POA: Diagnosis not present

## 2015-07-12 DIAGNOSIS — S8012XD Contusion of left lower leg, subsequent encounter: Secondary | ICD-10-CM | POA: Diagnosis not present

## 2015-07-12 NOTE — Progress Notes (Signed)
Patient ID: [name], female   DOB: [birth year], 81 y.o.   MRN: [id number]  Chief Complaint  Patient presents with  . Routine Post Op    I&D     HPI [name] is a 81 y.o. female here today for her post op I&D on left leg done on 07/05/15.  The patient has discomfort with direct pressure the area but otherwise is essentially asymptomatic.  I reviewed her history. HPI  Past Medical History  Diagnosis Date  . Shortness of breath dyspnea   . Hypertension   . CHF (congestive heart failure) ([place])   . GERD (gastroesophageal reflux disease)   . Arthritis     Past Surgical History  Procedure Laterality Date  . Cardiac catheterization    . Pyloric stenosis    . Cataract extraction w/phaco Right 02/20/2015    Procedure: CATARACT EXTRACTION PHACO AND INTRAOCULAR LENS PLACEMENT (IOC);  Surgeon: Estill Cotta, MD;  Location: [facility] ORS;  Service: Ophthalmology;  Laterality: Right;  US01:31.2 AP 25.3% CDE40.13 Fluid lot # [id number]  . Breast surgery Right 2005?    benign lump excision  . Cardiac valve replacement  1994, 2016  . Irrigation and debridement hematoma Left 07/05/2015    Procedure: IRRIGATION AND DEBRIDEMENT HEMATOMA;  Surgeon: Robert Bellow, MD;  Location: [facility] ORS;  Service: General;  Laterality: Left;    Family History  Problem Relation Age of Onset  . Stroke Mother   . Renal Disease Father     Social History Social History  Substance Use Topics  . Smoking status: Former Research scientist (life sciences)  . Smokeless tobacco: Never Used  . Alcohol Use: No     Comment: occasional    Allergies  Allergen Reactions  . Ace Inhibitors     Other reaction(s): Cough  . Hydrocodone Itching and Nausea Only  . Mercury     Other reaction(s): Unknown  . Nickel   . Silver Dermatitis    Severe itching  . Cephalexin Rash  . Penicillins Rash    Current Outpatient Prescriptions  Medication Sig Dispense Refill  . acetaminophen (TYLENOL) 500 MG tablet Take 500 mg by mouth  every 6 (six) hours as needed for mild pain, moderate pain, fever or headache.    Marland Kitchen amiodarone (PACERONE) 200 MG tablet TAKE 1 TABLET EVERY DAY    . aspirin EC 81 MG tablet Take 81 mg by mouth daily.     . furosemide (LASIX) 40 MG tablet Take 1 tablet (40 mg total) by mouth 2 (two) times daily. 30 tablet 0  . metolazone (ZAROXOLYN) 2.5 MG tablet Take 2.5 mg by mouth daily as needed (for swelling).     . metoprolol tartrate (LOPRESSOR) 25 MG tablet Take 25 mg by mouth 2 (two) times daily.    . Multiple Vitamin (MULTI-VITAMINS) TABS Take 1 tablet by mouth daily.     . pantoprazole (PROTONIX) 40 MG tablet Take 40 mg by mouth daily.     Marland Kitchen spironolactone (ALDACTONE) 25 MG tablet Take 1 tablet by mouth 2 (two) times daily.    . traMADol (ULTRAM) 50 MG tablet Take 25-50 mg by mouth 2 (two) times daily as needed for moderate pain or severe pain.     . vitamin C (ASCORBIC ACID) 500 MG tablet Take 500 mg by mouth daily as needed (for cold symptoms).      No current facility-administered medications for this visit.    Review of Systems Review of Systems  Constitutional: Negative.   Respiratory: Negative.  Cardiovascular: Negative.     Blood pressure 122/68, pulse 70, resp. rate 12, height 5\' 8"  (1.727 m), weight 163 lb (73.936 kg).  Physical Exam Physical Exam  Constitutional: She is oriented to person, place, and time. She appears well-developed and well-nourished.  Musculoskeletal:       Legs: Neurological: She is alert and oriented to person, place, and time.  Skin: Skin is warm and dry.   Applied [employer].   Assessment    Doing well status post debridement of the left lower extremity hematoma.  Good control of lower extremity edema with compressive wrap.    Plan    I think the patient will benefit from the application of a Unna . This will be changed in 1 week with the nurse and then a physician exam in 2 weeks.   Patient to return in one week for unna boot dressing  change.  PCP:  Sabra Heck, This information has been scribed by Gaspar Cola CMA.    Robert Bellow 07/12/2015, 8:48 PM

## 2015-07-12 NOTE — Progress Notes (Signed)
Because of the inclement weather, the patient was seen at her home.  She's done well since her dressing changed 2 days ago.  Examination of the left lower extremity showed a proximally 50-60% epithelialization of the area of previous hematoma coverage. The remaining area is clean. No erythema. The lateral aspect of the wound shows early granulation tissue. No erythema. Moderate lower extremity edema noted based on the line of demarcation above the level of the previously applied Ace wrap.  The leg was washed with saline and a new Adaptic dressing with bacitracin ointment applied. Fluff gauze, Kerlix, Ace wrap applied.  The procedure was well tolerated.  Arrangements will made for office reassessment in 3 days. The patient's daughter, [name] was present throughout the exam.

## 2015-07-12 NOTE — Patient Instructions (Signed)
Patient to return in one week. 

## 2015-07-19 ENCOUNTER — Ambulatory Visit (INDEPENDENT_AMBULATORY_CARE_PROVIDER_SITE_OTHER): Payer: Medicare Other | Admitting: *Deleted

## 2015-07-19 DIAGNOSIS — S8012XD Contusion of left lower leg, subsequent encounter: Secondary | ICD-10-CM

## 2015-07-19 NOTE — Patient Instructions (Signed)
The patient is aware to call back for any questions or concerns.  

## 2015-07-19 NOTE — Progress Notes (Signed)
Here today for unna boot dressing change. Lateral wound triangle shape largest point 2.5 x 3 cm. Inner wound 2.5 x 2.5 cm with some surrounding redness. Small amount bleeding noted from both wounds. Small piece alginate applied to the 2 wounds. Unna boot reapplied. Swelling has improved greatly. Follow up in one week as scheduled.

## 2015-07-26 ENCOUNTER — Ambulatory Visit: Payer: Medicare Other | Admitting: General Surgery

## 2015-07-26 ENCOUNTER — Encounter: Payer: Self-pay | Admitting: General Surgery

## 2015-07-26 ENCOUNTER — Ambulatory Visit (INDEPENDENT_AMBULATORY_CARE_PROVIDER_SITE_OTHER): Payer: Medicare Other | Admitting: General Surgery

## 2015-07-26 VITALS — BP 122/74 | HR 82 | Resp 14 | Ht 68.0 in | Wt 166.0 lb

## 2015-07-26 DIAGNOSIS — S8012XD Contusion of left lower leg, subsequent encounter: Secondary | ICD-10-CM

## 2015-07-26 NOTE — Patient Instructions (Signed)
The patient is aware to call back for any questions or concerns.  

## 2015-07-26 NOTE — Progress Notes (Signed)
Patient ID: [name], female   DOB: Oct 03, 1934, 81 y.o.   MRN: [id number]  Chief Complaint  Patient presents with  . Follow-up    HPI [name] is a 81 y.o. female.  here today for her post op I&D on left leg done on 07/05/15. She states her leg is still tender.  The patient complained somewhat bitterly about the Unna boot, but it was quite effective in decreasing the swelling in the left lower extremity.  She was accompanied today by her son.  I personally reviewed the patient's history.   HPI  Past Medical History  Diagnosis Date  . Shortness of breath dyspnea   . Hypertension   . CHF (congestive heart failure) ([place])   . GERD (gastroesophageal reflux disease)   . Arthritis     Past Surgical History  Procedure Laterality Date  . Cardiac catheterization    . Pyloric stenosis    . Cataract extraction w/phaco Right 02/20/2015    Procedure: CATARACT EXTRACTION PHACO AND INTRAOCULAR LENS PLACEMENT (IOC);  Surgeon: Estill Cotta, MD;  Location: [facility] ORS;  Service: Ophthalmology;  Laterality: Right;  US01:31.2 AP 25.3% CDE40.13 Fluid lot # [id number]  . Breast surgery Right 2005?    benign lump excision  . Cardiac valve replacement  1994, 2016  . Irrigation and debridement hematoma Left 07/05/2015    Procedure: IRRIGATION AND DEBRIDEMENT HEMATOMA;  Surgeon: Robert Bellow, MD;  Location: [facility] ORS;  Service: General;  Laterality: Left;    Family History  Problem Relation Age of Onset  . Stroke Mother   . Renal Disease Father     Social History Social History  Substance Use Topics  . Smoking status: Former Research scientist (life sciences)  . Smokeless tobacco: Never Used  . Alcohol Use: No     Comment: occasional    Allergies  Allergen Reactions  . Ace Inhibitors     Other reaction(s): Cough  . Hydrocodone Itching and Nausea Only  . Mercury     Other reaction(s): Unknown  . Nickel   . Silver Dermatitis    Severe itching  . Cephalexin Rash  . Penicillins Rash     Current Outpatient Prescriptions  Medication Sig Dispense Refill  . acetaminophen (TYLENOL) 500 MG tablet Take 500 mg by mouth every 6 (six) hours as needed for mild pain, moderate pain, fever or headache.    Marland Kitchen amiodarone (PACERONE) 200 MG tablet TAKE 1 TABLET EVERY DAY    . aspirin EC 81 MG tablet Take 81 mg by mouth daily.     . furosemide (LASIX) 40 MG tablet Take 1 tablet (40 mg total) by mouth 2 (two) times daily. 30 tablet 0  . metolazone (ZAROXOLYN) 2.5 MG tablet Take 2.5 mg by mouth daily as needed (for swelling).     . metoprolol tartrate (LOPRESSOR) 25 MG tablet Take 25 mg by mouth 2 (two) times daily.    . Multiple Vitamin (MULTI-VITAMINS) TABS Take 1 tablet by mouth daily.     . pantoprazole (PROTONIX) 40 MG tablet Take 40 mg by mouth daily.     Marland Kitchen spironolactone (ALDACTONE) 25 MG tablet Take 1 tablet by mouth 2 (two) times daily.    . traMADol (ULTRAM) 50 MG tablet Take 25-50 mg by mouth 2 (two) times daily as needed for moderate pain or severe pain.     . vitamin C (ASCORBIC ACID) 500 MG tablet Take 500 mg by mouth daily as needed (for cold symptoms).      No  current facility-administered medications for this visit.    Review of Systems Review of Systems  Constitutional: Negative.   Respiratory: Negative.   Cardiovascular: Negative.     Blood pressure 122/74, pulse 82, resp. rate 14, height 5\' 8"  (1.727 m), weight 166 lb (75.297 kg).  Physical Exam Physical Exam  Constitutional: She is oriented to person, place, and time. She appears well-developed and well-nourished.  Musculoskeletal:       Legs: Neurological: She is alert and oriented to person, place, and time.  Skin: Skin is warm and dry.  Psychiatric: Her behavior is normal.      Assessment    Steady improvement in left lower extremity wound.    Plan    The patient was offered discontinuation of the [employer]. This would require her to do daily wound care including showers, Neosporin application  and use of clean, white tube socks act as a dressing. She declined.    Follow up nurse one week for unna boot and MD in 2 weeks.  The patient and her son are aware of the small superficial laceration that occurred when the patient jumped while the dressing was being removed. This should heal spontaneously.   PCP:  [name] This information has been scribed by Karie Fetch RNBC.    Robert Bellow 07/27/2015, 5:07 PM

## 2015-08-02 ENCOUNTER — Ambulatory Visit (INDEPENDENT_AMBULATORY_CARE_PROVIDER_SITE_OTHER): Payer: Medicare Other | Admitting: *Deleted

## 2015-08-02 DIAGNOSIS — S8012XD Contusion of left lower leg, subsequent encounter: Secondary | ICD-10-CM

## 2015-08-02 NOTE — Patient Instructions (Signed)
The patient is aware to call back for any questions or concerns.  

## 2015-08-02 NOTE — Progress Notes (Signed)
Here today for unna boot dressing change. Lateral wound triangle shape largest point 2 x 2.5 cm. Inner wound 2.5 x 2.5 cm with some surrounding redness. Duoderm applied to both wounds. Unna boot reapplied. Swelling has improved greatly to lower extremity upper extremity/calf more swollen. Follow up in one week as scheduled.

## 2015-08-09 ENCOUNTER — Encounter: Payer: Self-pay | Admitting: General Surgery

## 2015-08-09 ENCOUNTER — Ambulatory Visit (INDEPENDENT_AMBULATORY_CARE_PROVIDER_SITE_OTHER): Payer: Medicare Other | Admitting: General Surgery

## 2015-08-09 VITALS — BP 106/64 | HR 76 | Resp 14 | Ht 68.5 in | Wt 165.0 lb

## 2015-08-09 DIAGNOSIS — S8012XD Contusion of left lower leg, subsequent encounter: Secondary | ICD-10-CM

## 2015-08-09 NOTE — Patient Instructions (Signed)
The patient is aware to call back for any questions or concerns. gauze as needed to wounds May shower

## 2015-08-09 NOTE — Progress Notes (Signed)
Patient ID: [name], female   DOB: July 09, 1934, 81 y.o.   MRN: [id number]  Chief Complaint  Patient presents with  . Follow-up    HPI [name] is a 81 y.o. female.  Here today for follow up left leg ulcer. The patient continues to have difficulty with the Unna boot removal due to her thin skin. Edema is improved by nursing report compared to last week.  Wound photos from last week reviewed.  I personally reviewed the patient's history.      HPI  Past Medical History  Diagnosis Date  . Shortness of breath dyspnea   . Hypertension   . CHF (congestive heart failure) ([place])   . GERD (gastroesophageal reflux disease)   . Arthritis     Past Surgical History  Procedure Laterality Date  . Cardiac catheterization    . Pyloric stenosis    . Cataract extraction w/phaco Right 02/20/2015    Procedure: CATARACT EXTRACTION PHACO AND INTRAOCULAR LENS PLACEMENT (IOC);  Surgeon: Estill Cotta, MD;  Location: [facility] ORS;  Service: Ophthalmology;  Laterality: Right;  US01:31.2 AP 25.3% CDE40.13 Fluid lot # [id number]  . Breast surgery Right 2005?    benign lump excision  . Cardiac valve replacement  1994, 2016  . Irrigation and debridement hematoma Left 07/05/2015    Procedure: IRRIGATION AND DEBRIDEMENT HEMATOMA;  Surgeon: Robert Bellow, MD;  Location: [facility] ORS;  Service: General;  Laterality: Left;    Family History  Problem Relation Age of Onset  . Stroke Mother   . Renal Disease Father     Social History Social History  Substance Use Topics  . Smoking status: Former Research scientist (life sciences)  . Smokeless tobacco: Never Used  . Alcohol Use: No     Comment: occasional    Allergies  Allergen Reactions  . Ace Inhibitors     Other reaction(s): Cough  . Hydrocodone Itching and Nausea Only  . Mercury     Other reaction(s): Unknown  . Nickel   . Silver Dermatitis    Severe itching  . Cephalexin Rash  . Penicillins Rash    Current Outpatient Prescriptions  Medication  Sig Dispense Refill  . acetaminophen (TYLENOL) 500 MG tablet Take 500 mg by mouth every 6 (six) hours as needed for mild pain, moderate pain, fever or headache.    Marland Kitchen amiodarone (PACERONE) 200 MG tablet TAKE 1 TABLET EVERY DAY    . aspirin EC 81 MG tablet Take 81 mg by mouth daily.     . furosemide (LASIX) 40 MG tablet Take 1 tablet (40 mg total) by mouth 2 (two) times daily. 30 tablet 0  . metolazone (ZAROXOLYN) 2.5 MG tablet Take 2.5 mg by mouth daily as needed (for swelling).     . metoprolol tartrate (LOPRESSOR) 25 MG tablet Take 25 mg by mouth 2 (two) times daily.    . Multiple Vitamin (MULTI-VITAMINS) TABS Take 1 tablet by mouth daily.     . pantoprazole (PROTONIX) 40 MG tablet Take 40 mg by mouth daily.     Marland Kitchen spironolactone (ALDACTONE) 25 MG tablet Take 1 tablet by mouth 2 (two) times daily.    . traMADol (ULTRAM) 50 MG tablet Take 25-50 mg by mouth 2 (two) times daily as needed for moderate pain or severe pain.     . vitamin C (ASCORBIC ACID) 500 MG tablet Take 500 mg by mouth daily as needed (for cold symptoms).      No current facility-administered medications for this visit.  Review of Systems Review of Systems  Constitutional: Negative.   Respiratory: Negative.   Cardiovascular: Negative.     Blood pressure 106/64, pulse 76, resp. rate 14, height 5' 8.5" (1.74 m), weight 165 lb (74.844 kg).  Physical Exam Physical Exam  Constitutional: She is oriented to person, place, and time. She appears well-developed and well-nourished.  Neurological: She is alert and oriented to person, place, and time.  Skin: Skin is warm and dry.  2.1 x 2.4 medial 1.8 x 2 lateral left lower leg  Psychiatric: Her behavior is normal.    Data Reviewed Wound photos.   Assessment    Continued improvement in both the medial and lateral ulcer. Adequate healing of the distal based flap from scissor trauma several weeks ago.    Plan    The patient has battled a lower extremity edema for some  time. I think she is unlikely to be a candidate for any venous therapy, she reports previously evaluation by [facility] in this regard.  We'll dispense with the boot and duodenum and see how she does with local wound care.  She's been asked to bathe/shower daily and if possible apply a thin film of Neosporin to the area. A light gauze dressing to prevent soiling has been encouraged.      Follow up with nurse in one week and MD in 2 weeks.   PCP:  [name] This information has been scribed by Karie Fetch RNBC.   Robert Bellow 08/10/2015, 4:11 PM

## 2015-08-16 ENCOUNTER — Telehealth: Payer: Self-pay | Admitting: General Surgery

## 2015-08-16 ENCOUNTER — Ambulatory Visit: Payer: Medicare Other | Admitting: *Deleted

## 2015-08-16 VITALS — BP 110/66 | HR 68 | Resp 12

## 2015-08-16 DIAGNOSIS — S8012XD Contusion of left lower leg, subsequent encounter: Secondary | ICD-10-CM

## 2015-08-16 NOTE — Progress Notes (Signed)
She is here today for wound check RLE. Wounds are scabbed over. Minimal drainage at night. Both legs are extremely swollen. She did have palpitations last night which it had been several weeks since she had any palpitations. She did take a metolazone for swelling last night. VSS. The patient refuses to go to the ED.The patients daughter will check to see about her getting an appointment with Dr Sabra Heck regarding the swelling. The patient opted to not have the unna boot placed at this time but is aware that if she changes her mind she will call back.  Dr Bary Castilla made aware of status and agrees with appt with PCP.

## 2015-08-16 NOTE — Patient Instructions (Addendum)
The patient is aware to call back for any questions or concerns.  

## 2015-08-16 NOTE — Telephone Encounter (Signed)
PTS DAUGHTER [name] CALLED TO SAY HER MOTHER HAS AN APPT WITH DR [place] @ 3:00.

## 2015-08-17 ENCOUNTER — Telehealth: Payer: Self-pay | Admitting: *Deleted

## 2015-08-18 NOTE — Telephone Encounter (Signed)
Patient called to update you on how she is doing and she stated that things are working out for her. She went to [facility] yesterday and had some studies done and she is just waiting to hear back from them. She wanted me to let you know that she will see you this coming week and thank you for everything.

## 2015-08-23 ENCOUNTER — Ambulatory Visit: Payer: Medicare Other | Admitting: General Surgery

## 2015-08-29 ENCOUNTER — Ambulatory Visit (INDEPENDENT_AMBULATORY_CARE_PROVIDER_SITE_OTHER): Payer: Medicare Other | Admitting: General Surgery

## 2015-08-29 ENCOUNTER — Encounter: Payer: Self-pay | Admitting: General Surgery

## 2015-08-29 VITALS — BP 92/60 | HR 58 | Resp 12 | Ht 68.5 in | Wt 176.0 lb

## 2015-08-29 DIAGNOSIS — R600 Localized edema: Secondary | ICD-10-CM | POA: Diagnosis not present

## 2015-08-29 DIAGNOSIS — S8012XD Contusion of left lower leg, subsequent encounter: Secondary | ICD-10-CM | POA: Diagnosis not present

## 2015-08-29 NOTE — Progress Notes (Signed)
Patient ID: [name], female   DOB: 08-Feb-1935, 81 y.o.   MRN: [id number]  Chief Complaint  Patient presents with  . Follow-up    leg ulcler    HPI [name] is a 81 y.o. female.  Here today for follow up leg ulcer. She states she hit the car door Sunday and caused a new skin tear on the lateral left leg.   She did see Dr. Sabra Heck after her last visit here and he did not change her medications. She did go to [facility] ( Dr Mina Marble ) for her cardiology follow up due to increased swelling in her lower legs and they changed her medications. She did have an overnight halter monitor. She states her heart function on recent testing had decreased to 25%.  The patient reports when arising in the morning she has not appreciated the same decrease in leg swelling that she had in years past.  I personally reviewed the patient's history. HPI  Past Medical History  Diagnosis Date  . Shortness of breath dyspnea   . Hypertension   . CHF (congestive heart failure) ([place])   . GERD (gastroesophageal reflux disease)   . Arthritis     Past Surgical History  Procedure Laterality Date  . Cardiac catheterization    . Pyloric stenosis    . Cataract extraction w/phaco Right 02/20/2015    Procedure: CATARACT EXTRACTION PHACO AND INTRAOCULAR LENS PLACEMENT (IOC);  Surgeon: Estill Cotta, MD;  Location: [facility] ORS;  Service: Ophthalmology;  Laterality: Right;  US01:31.2 AP 25.3% CDE40.13 Fluid lot # [id number]  . Breast surgery Right 2005?    benign lump excision  . Cardiac valve replacement  1994, 2016  . Irrigation and debridement hematoma Left 07/05/2015    Procedure: IRRIGATION AND DEBRIDEMENT HEMATOMA;  Surgeon: Robert Bellow, MD;  Location: [facility] ORS;  Service: General;  Laterality: Left;    Family History  Problem Relation Age of Onset  . Stroke Mother   . Renal Disease Father     Social History Social History  Substance Use Topics  . Smoking status: Former Research scientist (life sciences)  . Smokeless  tobacco: Never Used  . Alcohol Use: No     Comment: occasional    Allergies  Allergen Reactions  . Ace Inhibitors     Other reaction(s): Cough  . Hydrocodone Itching and Nausea Only  . Mercury     Other reaction(s): Unknown  . Nickel   . Silver Dermatitis    Severe itching  . Cephalexin Rash  . Penicillins Rash    Current Outpatient Prescriptions  Medication Sig Dispense Refill  . acetaminophen (TYLENOL) 500 MG tablet Take 500 mg by mouth every 6 (six) hours as needed for mild pain, moderate pain, fever or headache.    Marland Kitchen amiodarone (PACERONE) 200 MG tablet TAKE 1 TABLET EVERY DAY    . aspirin EC 81 MG tablet Take 81 mg by mouth daily.     Marland Kitchen losartan (COZAAR) 25 MG tablet TAKE 1 TABLET (25 MG TOTAL) BY MOUTH ONCE DAILY.  11  . metolazone (ZAROXOLYN) 2.5 MG tablet Take 2.5 mg by mouth daily as needed (for swelling).     . Multiple Vitamin (MULTI-VITAMINS) TABS Take 1 tablet by mouth daily.     . pantoprazole (PROTONIX) 40 MG tablet Take 40 mg by mouth daily.     Marland Kitchen spironolactone (ALDACTONE) 25 MG tablet Take 1 tablet by mouth 2 (two) times daily.     Marland Kitchen torsemide (DEMADEX) 20 MG tablet  TAKE 2 TABLETS (40 MG TOTAL) BY MOUTH 2 times  DAILY.  11  . vitamin C (ASCORBIC ACID) 500 MG tablet Take 500 mg by mouth daily as needed (for cold symptoms).      No current facility-administered medications for this visit.    Review of Systems Review of Systems  Constitutional: Negative.   Respiratory: Negative.   Cardiovascular: Negative.     Blood pressure 92/60, pulse 58, resp. rate 12, height 5' 8.5" (1.74 m), weight 176 lb (79.833 kg).  Physical Exam Physical Exam  Constitutional: She is oriented to person, place, and time. She appears well-developed and well-nourished.  HENT:  Mouth/Throat: Oropharynx is clear and moist.  Cardiovascular: Normal rate and normal heart sounds.  An irregular rhythm present.  bilateral lower leg edema present.  Pulmonary/Chest: Effort normal and  breath sounds normal.  Musculoskeletal:       Legs: Marked symmetric bilateral lower extremity swelling with pitting edema extending down to the toes. Palpable pedal pulses remained present.  Neurological: She is alert and oriented to person, place, and time.  Skin: Skin is warm and dry.  Psychiatric: Her behavior is normal.    Data Reviewed [place] records from February 2017 reviewed. Bilateral lower extremity Doppler completed without evidence of DVT on 08/17/2015.  The patient's renal function has significantly deteriorated from October 2016. Creatinine at that time was 1.1 with an estimated GFR 46. Her creatinine and EGFR in February 2017 at [facility] were 2.2 and 22 respectively.  .  Assessment    Profound lower extremity edema secondary to worsening congestive failure, aggravated by atrial fibrillation.    Plan    We'll make use of a trial of compressive stockings, the importance of having these fitted first thing in the morning and being applied first thing in the day to help provide as much support as possible for her lower cavity swelling was encouraged.  With no evidence of DVT and worsening congestive failure, healing will be slow, and future traumas need to be minimized.     RX for Juxta-lite velcro stockings Follow up in 3 weeks.   PCP:  [name] This information has been scribed by Karie Fetch RNBC.   Robert Bellow 08/30/2015, 6:11 AM

## 2015-08-30 DIAGNOSIS — R600 Localized edema: Secondary | ICD-10-CM | POA: Insufficient documentation

## 2015-09-21 ENCOUNTER — Encounter: Payer: Self-pay | Admitting: General Surgery

## 2015-09-21 ENCOUNTER — Ambulatory Visit (INDEPENDENT_AMBULATORY_CARE_PROVIDER_SITE_OTHER): Payer: Medicare Other | Admitting: General Surgery

## 2015-09-21 VITALS — BP 92/58 | HR 56 | Resp 14 | Ht 68.0 in | Wt 182.0 lb

## 2015-09-21 DIAGNOSIS — S8012XA Contusion of left lower leg, initial encounter: Secondary | ICD-10-CM | POA: Diagnosis not present

## 2015-09-21 DIAGNOSIS — L089 Local infection of the skin and subcutaneous tissue, unspecified: Secondary | ICD-10-CM | POA: Diagnosis not present

## 2015-09-21 DIAGNOSIS — R600 Localized edema: Secondary | ICD-10-CM | POA: Diagnosis not present

## 2015-09-21 NOTE — Patient Instructions (Addendum)
The patient is aware to call back for any questions or concerns. Follow up as needed.

## 2015-09-21 NOTE — Progress Notes (Signed)
Patient ID: [name], female   DOB: [birth year], 81 y.o.   MRN: [id number]  Chief Complaint  Patient presents with  . Follow-up    HPI [name] is a 81 y.o. female.Here today for follow up left leg ulcer. She states she had to spend a week in [facility] for testing and labs and her cardiologist felt it was venous stasis or lymphedema. She states her heart is weaker and EF is 25%.  She has seen Dr. Delana Meyer and have the unna boots back on, last placed on Monday. She states the left leg hurts worse in the morning hours. She isResume having Velcro compression wraps applied and has been instructed that she would benefit from pneumatic sleeve therapy.   She noticed a bruise left breast last night, denies injury.            HPI  Past Medical History  Diagnosis Date  . Shortness of breath dyspnea   . Hypertension   . CHF (congestive heart failure) ([place])   . GERD (gastroesophageal reflux disease)   . Arthritis     Past Surgical History  Procedure Laterality Date  . Cardiac catheterization    . Pyloric stenosis    . Cataract extraction w/phaco Right 02/20/2015    Procedure: CATARACT EXTRACTION PHACO AND INTRAOCULAR LENS PLACEMENT (IOC);  Surgeon: Estill Cotta, MD;  Location: [facility] OR[facility];  Service: Ophthalmology;  Laterality: Right;  US01:31.2 AP 25.3% CDE40.13 Fluid lot # [id number] H  . Breast surgery Right 2005?    benign lump excision  . Cardiac valve replacement  1994, 2016  . Irrigation and debridement hematoma Left 07/05/2015    Procedure: IRRIGATION AND DEBRIDEMENT HEMATOMA;  Surgeon: Robert Bellow, MD;  Location: [facility] OR[facility];  Service: General;  Laterality: Left;  . US echocardiography      Family History  Problem Relation Age of Onset  . Stroke Mother   . Renal Disease Father     Social History Social History  Substance Use Topics  . Smoking status: Former Research scientist (life sciences)  . Smokeless tobacco: Never Used  . Alcohol Use: No     Comment: occasional    Allergies   Allergen Reactions  . Ace Inhibitors     Other reaction(s): Cough  . Hydrocodone Itching and Nausea Only  . Mercury     Other reaction(s): Unknown  . Nickel   . Silver Dermatitis    Severe itching  . Cephalexin Rash  . Penicillins Rash    Current Outpatient Prescriptions  Medication Sig Dispense Refill  . acetaminophen (TYLENOL) 500 MG tablet Take 500 mg by mouth every 6 (six) hours as needed for mild pain, moderate pain, fever or headache.    Marland Kitchen aspirin EC 81 MG tablet Take 81 mg by mouth daily.     Marland Kitchen losartan (COZAAR) 25 MG tablet TAKE 1 TABLET (25 MG TOTAL) BY MOUTH ONCE DAILY.  11  . metoprolol succinate (TOPROL-XL) 100 MG 24 hr tablet Take 100 mg by mouth daily. Take with or immediately following a meal.    . Multiple Vitamin (MULTI-VITAMINS) TABS Take 1 tablet by mouth daily.     . pantoprazole (PROTONIX) 40 MG tablet Take 40 mg by mouth daily.     Marland Kitchen spironolactone (ALDACTONE) 25 MG tablet Take 1 tablet by mouth 2 (two) times daily.     Marland Kitchen torsemide (DEMADEX) 20 MG tablet TAKE 2 TABLETS (40 MG TOTAL) BY MOUTH 2 times  DAILY.  11  . traMADol (ULTRAM) 50 MG tablet  Take by mouth every 6 (six) hours as needed.    . vitamin C (ASCORBIC ACID) 500 MG tablet Take 500 mg by mouth daily as needed (for cold symptoms).     . warfarin (COUMADIN) 5 MG tablet Take 5 mg by mouth as directed. Myna Hidalgo [facility]    . warfarin (COUMADIN) 7.5 MG tablet Take 7.5 mg by mouth as directed. Wed fri     No current facility-administered medications for this visit.    Review of Systems Review of Systems  Constitutional: Negative.   Respiratory: Negative.   Cardiovascular: Negative.     Blood pressure 92/58, pulse 56, resp. rate 14, height 5\' 8"  (1.727 m), weight 182 lb (82.555 kg).  Physical Exam Physical Exam  Constitutional: She is oriented to person, place, and time. She appears well-developed and well-nourished.  HENT:  Mouth/Throat: Oropharynx is clear and moist.  Eyes: Conjunctivae are normal.  No scleral icterus.  Cardiovascular:  Bilateral lower leg edema present with unna boots in place.  Pulmonary/Chest:    1.5 cm thickening left breast with bruise    Musculoskeletal:       Legs: Neurological: She is alert and oriented to person, place, and time.  Skin: Skin is warm and dry.  2 skin tears left lower arm  Psychiatric: Her behavior is normal.    Data Reviewed Records from her [facility] hospitalization March 2 through 09/06/2015 were reviewed online. Minimal change in lower extremity edema with diuretic therapy. Ejection fraction remains 25%. Patient is felt to have lymphedema versus venous stasis changes.  Assessment    Bilateral lower extremity edema, no evidence of DVT on [facility] ultrasound.  Skin changes consistent with chronic venous stasis insufficiency.    Plan    The patient is presently being followed by vascular service. She insisted that she return in one month for repeat evaluation here.  I emphasized to her that compressive wraps and a pneumatic stockings may greatly improve her lower extremity edema and result in more rapid healing of her wounds and less frequent injuries.  The breast wound/hematoma/ecchymosis should have resolved by the time of her next visit.     Follow up one month.   PCP:  [name] Dr Delana Meyer   This information has been scribed by Karie Fetch [place].   Robert Bellow 09/22/2015, 2:08 PM

## 2015-09-25 ENCOUNTER — Encounter: Payer: Medicare Other | Attending: Cardiovascular Disease | Admitting: *Deleted

## 2015-09-25 VITALS — Ht 65.5 in | Wt 186.1 lb

## 2015-09-25 DIAGNOSIS — I5042 Chronic combined systolic (congestive) and diastolic (congestive) heart failure: Secondary | ICD-10-CM | POA: Insufficient documentation

## 2015-09-25 NOTE — Progress Notes (Signed)
Daily Session Note  Patient Details  Name: BLYSS LUGAR MRN: [id number] Date of Birth: 10/10/1934 Referring Provider:  Mackie Pai*  Encounter Date: 09/25/2015  Check In:     Session Check In - 09/25/15 1532    Check-In   Location [facility]   Staff Present Gerlene Burdock, RN, Drusilla Kanner, MS, ACSM CEP, Exercise Physiologist   Supervising physician immediately available to respond to emergencies See telemetry face sheet for immediately available ER MD   Medication changes reported     Yes   Fall or balance concerns reported    No   Warm-up and Cool-down Not performed (comment)   Resistance Training Performed No   VAD Patient? No   Pain Assessment   Currently in Pain? No/denies         Goals Met:  Proper associated with RPD/PD & O2 Sat Exercise tolerated well  Goals Unmet:  Not Applicable  Comments:    Dr. [name] is Medical Director for [place] and [facility].

## 2015-09-25 NOTE — Progress Notes (Signed)
Cardiac Individual Treatment Plan  Patient Details  Name: [name] MRN: [id number] Date of Birth: 1935-04-29 Referring Provider:  Mackie Pai*  Initial Encounter Date:       Cardiac Rehab from 09/25/2015 in [facility] Cardiac and Pulmonary Rehab   Date  09/25/15      Visit Diagnosis: Chronic combined systolic and diastolic congestive heart failure ([place])  Patient's Home Medications on Admission:  Current outpatient prescriptions:  .  acetaminophen (TYLENOL) 500 MG tablet, Take 500 mg by mouth every 6 (six) hours as needed for mild pain, moderate pain, fever or headache., Disp: , Rfl:  .  aspirin EC 81 MG tablet, Take 81 mg by mouth daily. , Disp: , Rfl:  .  losartan (COZAAR) 25 MG tablet, TAKE 1 TABLET (25 MG TOTAL) BY MOUTH ONCE DAILY., Disp: , Rfl: 11 .  metoprolol succinate (TOPROL-XL) 100 MG 24 hr tablet, Take 100 mg by mouth daily. Take with or immediately following a meal., Disp: , Rfl:  .  Multiple Vitamin (MULTI-VITAMINS) TABS, Take 1 tablet by mouth daily. , Disp: , Rfl:  .  pantoprazole (PROTONIX) 40 MG tablet, Take 40 mg by mouth daily. , Disp: , Rfl:  .  spironolactone (ALDACTONE) 25 MG tablet, Take 1 tablet by mouth 2 (two) times daily. , Disp: , Rfl:  .  torsemide (DEMADEX) 20 MG tablet, TAKE 2 TABLETS (40 MG TOTAL) BY MOUTH 2 times  DAILY., Disp: , Rfl: 11 .  traMADol (ULTRAM) 50 MG tablet, Take by mouth every 6 (six) hours as needed., Disp: , Rfl:  .  vitamin C (ASCORBIC ACID) 500 MG tablet, Take 500 mg by mouth daily as needed (for cold symptoms). , Disp: , Rfl:  .  warfarin (COUMADIN) 5 MG tablet, Take 5 mg by mouth as directed. Myna Hidalgo [facility], Disp: , Rfl:  .  warfarin (COUMADIN) 7.5 MG tablet, Take 7.5 mg by mouth as directed. Wed fri, Disp: , Rfl:   Past Medical History: Past Medical History  Diagnosis Date  . Shortness of breath dyspnea   . Hypertension   . CHF (congestive heart failure) ([place])   . GERD (gastroesophageal reflux disease)   .  Arthritis     Tobacco Use: History  Smoking status  . Former Smoker  Smokeless tobacco  . Never Used    Labs: Recent Review Flowsheet Data    There is no flowsheet data to display.       Exercise Target Goals: Date: 09/25/15  Exercise Program Goal: Individual exercise prescription set with THRR, safety & activity barriers. Participant demonstrates ability to understand and report RPE using BORG scale, to self-measure pulse accurately, and to acknowledge the importance of the exercise prescription.  Exercise Prescription Goal: Starting with aerobic activity 30 plus minutes a day, 3 days per week for initial exercise prescription. Provide home exercise prescription and guidelines that participant acknowledges understanding prior to discharge.  Activity Barriers & Risk Stratification:     Activity Barriers & Cardiac Risk Stratification - 09/25/15 1531    Activity Barriers & Cardiac Risk Stratification   Activity Barriers Arthritis;Joint Problems   Cardiac Risk Stratification High      6 Minute Walk:     6 Minute Walk      09/25/15 1509       6 Minute Walk   Phase Initial  NuStep Test      Distance 413 feet  Nu Step Test Level 1 with arms     Walk  Time 6 minutes     # of Rest Breaks 0     RPE 13     Symptoms No     Resting HR 111 bpm     Resting BP 132/60 mmHg     Max Ex. HR 116 bpm     Max Ex. BP 138/54 mmHg        Initial Exercise Prescription:     Initial Exercise Prescription - 09/25/15 1500    Date of Initial Exercise Prescription   Date 09/25/15   Treadmill   MPH 1  Use caution with treadmill and small time intervals   Grade 0   Minutes 3   Recumbant Bike   Level 2   RPM 40   Watts 15   Minutes 10   NuStep   Level 2   Watts 20   Minutes 15   Arm Ergometer   Level 1   Watts 10   Minutes 15   Arm/Foot Ergometer   Level 4   Watts 12   Minutes 15   Recumbant Elliptical   Level 1   RPM 35   Watts 10   Minutes 10   REL-XR    Level 2   Watts 30   Minutes 15   T5 Nustep   Level 2   Watts 15   Minutes 15   Biostep-RELP   Level 2   Watts 15   Minutes 15   Prescription Details   Frequency (times per week) 3   Duration Progress to 30 minutes of continuous aerobic without signs/symptoms of physical distress   Intensity   THRR REST +  30   Ratings of Perceived Exertion 11-15   Progression   Progression Continue progressive overload as per policy without signs/symptoms or physical distress.   Resistance Training   Training Prescription Yes   Weight 2   Reps 10-15      Perform Capillary Blood Glucose checks as needed.  Exercise Prescription Changes:   Exercise Comments:   Discharge Exercise Prescription (Final Exercise Prescription Changes):   Nutrition:  Target Goals: Understanding of nutrition guidelines, daily intake of sodium 1500mg , cholesterol 200mg , calories 30% from fat and 7% or less from saturated fats, daily to have 5 or more servings of fruits and vegetables.  Biometrics:     Pre Biometrics - 09/25/15 1508    Pre Biometrics   Height 5' 5.5" (1.664 m)   Weight 186 lb 1.6 oz (84.414 kg)   Waist Circumference 34 inches   Hip Circumference 42 inches   Waist to Hip Ratio 0.81 %   BMI (Calculated) 30.6       Nutrition Therapy Plan and Nutrition Goals:     Nutrition Therapy & Goals - 09/25/15 1535    Nutrition Therapy   Drug/Food Interactions Coumadin/Vit K   Personal Nutrition Goals   Personal Goal #1 Using less salt   Intervention Plan   Intervention Prescribe, educate and counsel regarding individualized specific dietary modifications aiming towards targeted core components such as weight, hypertension, lipid management, diabetes, heart failure and other comorbidities.   Expected Outcomes Short Term Goal: Understand basic principles of dietary content, such as calories, fat, sodium, cholesterol and nutrients.;Long Term Goal: Adherence to prescribed nutrition plan.       Nutrition Discharge: Rate Your Plate Scores:   Nutrition Goals Re-Evaluation:   Psychosocial: Target Goals: Acknowledge presence or absence of depression, maximize coping skills, provide positive support system. Participant is able to verbalize types and  ability to use techniques and skills needed for reducing stress and depression.  Initial Review & Psychosocial Screening:     Initial Psych Review & Screening - 09/25/15 [place]? Yes      Quality of Life Scores:   PHQ-9:     Recent Review Flowsheet Data    Depression screen [facility] 2/9 09/25/2015 01/05/2015   Decreased Interest 0 0   Down, Depressed, Hopeless 0 0   PHQ - 2 Score 0 0   Altered sleeping 3 1   Tired, decreased energy 3 1   Change in appetite 0 0   Feeling bad or failure about yourself  0 0   Trouble concentrating 0 0   Moving slowly or fidgety/restless 0 0   Suicidal thoughts 0 0   PHQ-9 Score 6 2   Difficult doing work/chores Somewhat difficult -      Psychosocial Evaluation and Intervention:   Psychosocial Re-Evaluation:   Vocational Rehabilitation: Provide vocational rehab assistance to qualifying candidates.   Vocational Rehab Evaluation & Intervention:     Vocational Rehab - 09/25/15 1532    Initial Vocational Rehab Evaluation & Intervention   Assessment shows need for Vocational Rehabilitation No      Education: Education Goals: Education classes will be provided on a weekly basis, covering required topics. Participant will state understanding/return demonstration of topics presented.  Learning Barriers/Preferences:   Education Topics: General Nutrition Guidelines/Fats and Fiber: -Group instruction provided by verbal, written material, models and posters to present the general guidelines for heart healthy nutrition. Gives an explanation and review of dietary fats and fiber.   Controlling Sodium/Reading Food Labels: -Group verbal and written  material supporting the discussion of sodium use in heart healthy nutrition. Review and explanation with models, verbal and written materials for utilization of the food label.          Cardiac Rehab from 09/25/2015 in [facility] Cardiac and Pulmonary Rehab   Date  12/26/14   Educator  CR   Instruction Review Code  2- meets goals/outcomes      Exercise Physiology & Risk Factors: - Group verbal and written instruction with models to review the exercise physiology of the cardiovascular system and associated critical values. Details cardiovascular disease risk factors and the goals associated with each risk factor.      Cardiac Rehab from 09/25/2015 in [facility] Cardiac and Pulmonary Rehab   Date  11/16/14   Educator  RM   Instruction Review Code  2- meets goals/outcomes      Aerobic Exercise & Resistance Training: - Gives group verbal and written discussion on the health impact of inactivity. On the components of aerobic and resistive training programs and the benefits of this training and how to safely progress through these programs.      Cardiac Rehab from 09/25/2015 in [facility] Cardiac and Pulmonary Rehab   Date  11/23/14   Educator  RM   Instruction Review Code  2- meets goals/outcomes      Flexibility, Balance, General Exercise Guidelines: - Provides group verbal and written instruction on the benefits of flexibility and balance training programs. Provides general exercise guidelines with specific guidelines to those with heart or lung disease. Demonstration and skill practice provided.      Cardiac Rehab from 09/25/2015 in [facility] Cardiac and Pulmonary Rehab   Date  11/21/14   Educator  SB   Instruction Review Code  2- meets goals/outcomes  Stress Management: - Provides group verbal and written instruction about the health risks of elevated stress, cause of high stress, and healthy ways to reduce stress.      Cardiac Rehab from 09/25/2015 in [facility] Cardiac and Pulmonary Rehab   Date  11/30/14    Educator  [facility]   Instruction Review Code  2- meets goals/outcomes      Depression: - Provides group verbal and written instruction on the correlation between heart/lung disease and depressed mood, treatment options, and the stigmas associated with seeking treatment.      Cardiac Rehab from 09/25/2015 in [facility] Cardiac and Pulmonary Rehab   Date  11/09/14   Educator  [facility]   Instruction Review Code  2- meets goals/outcomes      Anatomy & Physiology of the Heart: - Group verbal and written instruction and models provide basic cardiac anatomy and physiology, with the coronary electrical and arterial systems. Review of: AMI, Angina, Valve disease, Heart Failure, Cardiac Arrhythmia, Pacemakers, and the ICD.      Cardiac Rehab from 09/25/2015 in [facility] Cardiac and Pulmonary Rehab   Date  12/05/14   Educator  SB   Instruction Review Code  2- meets goals/outcomes      Cardiac Procedures: - Group verbal and written instruction and models to describe the testing methods done to diagnose heart disease. Reviews the outcomes of the test results. Describes the treatment choices: Medical Management, Angioplasty, or Coronary Bypass Surgery.      Cardiac Rehab from 09/25/2015 in [facility] Cardiac and Pulmonary Rehab   Date  11/07/14   Educator  Foster Simpson RN BSN CCR[facility]   Instruction Review Code  2- meets goals/outcomes      Cardiac Medications: - Group verbal and written instruction to review commonly prescribed medications for heart disease. Reviews the medication, class of the drug, and side effects. Includes the steps to properly store meds and maintain the prescription regimen.      Cardiac Rehab from 09/25/2015 in [facility] Cardiac and Pulmonary Rehab   Date  11/14/14   Educator  Foster Simpson RN CCRP   Instruction Review Code  2- meets goals/outcomes      Go Sex-Intimacy & Heart Disease, Get SMART - Goal Setting: - Group verbal and written instruction through game format to discuss heart disease and the return to  sexual intimacy. Provides group verbal and written material to discuss and apply goal setting through the application of the S.M.A.R.T. Method.      Cardiac Rehab from 09/25/2015 in [facility] Cardiac and Pulmonary Rehab   Date  11/07/14   Educator  Foster Simpson RN BSN CCRP   Instruction Review Code  2- meets goals/outcomes      Other Matters of the Heart: - Provides group verbal, written materials and models to describe Heart Failure, Angina, Valve Disease, and Diabetes in the realm of heart disease. Includes description of the disease process and treatment options available to the cardiac patient.      Cardiac Rehab from 09/25/2015 in [facility] Cardiac and Pulmonary Rehab   Date  12/14/14   Educator  CE   Instruction Review Code  2- meets goals/outcomes      Exercise & Equipment Safety: - Individual verbal instruction and demonstration of equipment use and safety with use of the equipment.      Cardiac Rehab from 09/25/2015 in [facility] Cardiac and Pulmonary Rehab   Date  09/25/15   Educator  C. [place]   Instruction Review Code  1- partially meets,  needs review/practice      Infection Prevention: - Provides verbal and written material to individual with discussion of infection control including proper hand washing and proper equipment cleaning during exercise session.      Cardiac Rehab from 09/25/2015 in [facility] Cardiac and Pulmonary Rehab   Date  09/25/15   Educator  C. EnterkinRN   Instruction Review Code  2- meets goals/outcomes      Falls Prevention: - Provides verbal and written material to individual with discussion of falls prevention and safety.      Cardiac Rehab from 09/25/2015 in [facility] Cardiac and Pulmonary Rehab   Date  09/25/15   Educator  C. Galva   Instruction Review Code  1- partially meets, needs review/practice      Diabetes: - Individual verbal and written instruction to review signs/symptoms of diabetes, desired ranges of glucose level fasting, after meals and with  exercise. Advice that pre and post exercise glucose checks will be done for 3 sessions at entry of program.    Knowledge Questionnaire Score:     Knowledge Questionnaire Score - 09/25/15 1532    Knowledge Questionnaire Score   Pre Score 18      Core Components/Risk Factors/Patient Goals at Admission:     Personal Goals and Risk Factors at Admission - 09/25/15 1536    Core Components/Risk Factors/Patient Goals on Admission   Increase Strength and Stamina Yes   Intervention Provide advice, education, support and counseling about physical activity/exercise needs.;Develop an individualized exercise prescription for aerobic and resistive training based on initial evaluation findings, risk stratification, comorbidities and participant's personal goals.   Expected Outcomes Achievement of increased cardiorespiratory fitness and enhanced flexibility, muscular endurance and strength shown through measurements of functional capacity and personal statement of participant.   Heart Failure Yes   Intervention Provide a combined exercise and nutrition program that is supplemented with education, support and counseling about heart failure. Directed toward relieving symptoms such as shortness of breath, decreased exercise tolerance, and extremity edema.   Expected Outcomes Improve functional capacity of life;Short term: Attendance in program 2-3 days a week with increased exercise capacity. Reported lower sodium intake. Reported increased fruit and vegetable intake. Reports medication compliance.;Short term: Daily weights obtained and reported for increase. Utilizing diuretic protocols set by physician.;Long term: Adoption of self-care skills and reduction of barriers for early signs and symptoms recognition and intervention leading to self-care maintenance.      Core Components/Risk Factors/Patient Goals Review:    Core Components/Risk Factors/Patient Goals at Discharge (Final Review):    ITP  Comments:   Comments: [name] has her legs wrapped currently for swelling. Next day she reports she will get them done is this Wednesday at 10am.

## 2015-09-25 NOTE — Patient Instructions (Signed)
Patient Instructions  Patient Details  Name: [name] MRN: [id number] Date of Birth: 21-Aug-1934 Referring Provider:  Mackie Pai*  Below are the personal goals you chose as well as exercise and nutrition goals. Our goal is to help you keep on track towards obtaining and maintaining your goals. We will be discussing your progress on these goals with you throughout the program.  Initial Exercise Prescription:     Initial Exercise Prescription - 09/25/15 1500    Date of Initial Exercise Prescription   Date 09/25/15   Treadmill   MPH 1  Use caution with treadmill and small time intervals   Grade 0   Minutes 3   Recumbant Bike   Level 2   RPM 40   Watts 15   Minutes 10   NuStep   Level 2   Watts 20   Minutes 15   Arm Ergometer   Level 1   Watts 10   Minutes 15   Arm/Foot Ergometer   Level 4   Watts 12   Minutes 15   Recumbant Elliptical   Level 1   RPM 35   Watts 10   Minutes 10   REL-XR   Level 2   Watts 30   Minutes 15   T5 Nustep   Level 2   Watts 15   Minutes 15   Biostep-RELP   Level 2   Watts 15   Minutes 15   Prescription Details   Frequency (times per week) 3   Duration Progress to 30 minutes of continuous aerobic without signs/symptoms of physical distress   Intensity   THRR REST +  30   Ratings of Perceived Exertion 11-15   Progression   Progression Continue progressive overload as per policy without signs/symptoms or physical distress.   Resistance Training   Training Prescription Yes   Weight 2   Reps 10-15      Exercise Goals: Frequency: Be able to perform aerobic exercise three times per week working toward 3-5 days per week.  Intensity: Work with a perceived exertion of 11 (fairly light) - 15 (hard) as tolerated. Follow your new exercise prescription and watch for changes in prescription as you progress with the program. Changes will be reviewed with you when they are made.  Duration: You should be able to do 30  minutes of continuous aerobic exercise in addition to a 5 minute warm-up and a 5 minute cool-down routine.  Nutrition Goals: Your personal nutrition goals will be established when you do your nutrition analysis with the dietician.  The following are nutrition guidelines to follow: Cholesterol < 200mg /day Sodium < 1500mg /day Fiber: Women over 50 yrs - 21 grams per day  Personal Goals:     Personal Goals and Risk Factors at Admission - 09/25/15 1536    Core Components/Risk Factors/Patient Goals on Admission   Increase Strength and Stamina Yes   Intervention Provide advice, education, support and counseling about physical activity/exercise needs.;Develop an individualized exercise prescription for aerobic and resistive training based on initial evaluation findings, risk stratification, comorbidities and participant's personal goals.   Expected Outcomes Achievement of increased cardiorespiratory fitness and enhanced flexibility, muscular endurance and strength shown through measurements of functional capacity and personal statement of participant.   Heart Failure Yes   Intervention Provide a combined exercise and nutrition program that is supplemented with education, support and counseling about heart failure. Directed toward relieving symptoms such as shortness of breath, decreased exercise tolerance, and extremity edema.  Expected Outcomes Improve functional capacity of life;Short term: Attendance in program 2-3 days a week with increased exercise capacity. Reported lower sodium intake. Reported increased fruit and vegetable intake. Reports medication compliance.;Short term: Daily weights obtained and reported for increase. Utilizing diuretic protocols set by physician.;Long term: Adoption of self-care skills and reduction of barriers for early signs and symptoms recognition and intervention leading to self-care maintenance.      Tobacco Use Initial Evaluation: History  Smoking status  .  Former Smoker  Smokeless tobacco  . Never Used    Copy of goals given to participant.

## 2015-09-26 ENCOUNTER — Encounter: Payer: Self-pay | Admitting: *Deleted

## 2015-09-26 ENCOUNTER — Telehealth: Payer: Self-pay | Admitting: *Deleted

## 2015-09-26 NOTE — Progress Notes (Signed)
Cardiac Individual Treatment Plan  Patient Details  Name: [name] MRN: [id number] Date of Birth: 1935-01-16 Referring Provider:  No ref. provider found  Initial Encounter Date:       Cardiac Rehab from 09/25/2015 in [facility] Cardiac and Pulmonary Rehab   Date  09/25/15      Visit Diagnosis: No diagnosis found.  Patient's Home Medications on Admission:  Current outpatient prescriptions:  .  acetaminophen (TYLENOL) 500 MG tablet, Take 500 mg by mouth every 6 (six) hours as needed for mild pain, moderate pain, fever or headache., Disp: , Rfl:  .  aspirin EC 81 MG tablet, Take 81 mg by mouth daily. , Disp: , Rfl:  .  losartan (COZAAR) 25 MG tablet, TAKE 1 TABLET (25 MG TOTAL) BY MOUTH ONCE DAILY., Disp: , Rfl: 11 .  metoprolol succinate (TOPROL-XL) 100 MG 24 hr tablet, Take 100 mg by mouth daily. Take with or immediately following a meal., Disp: , Rfl:  .  Multiple Vitamin (MULTI-VITAMINS) TABS, Take 1 tablet by mouth daily. , Disp: , Rfl:  .  pantoprazole (PROTONIX) 40 MG tablet, Take 40 mg by mouth daily. , Disp: , Rfl:  .  spironolactone (ALDACTONE) 25 MG tablet, Take 1 tablet by mouth 2 (two) times daily. , Disp: , Rfl:  .  torsemide (DEMADEX) 20 MG tablet, TAKE 2 TABLETS (40 MG TOTAL) BY MOUTH 2 times  DAILY., Disp: , Rfl: 11 .  traMADol (ULTRAM) 50 MG tablet, Take by mouth every 6 (six) hours as needed., Disp: , Rfl:  .  vitamin C (ASCORBIC ACID) 500 MG tablet, Take 500 mg by mouth daily as needed (for cold symptoms). , Disp: , Rfl:  .  warfarin (COUMADIN) 5 MG tablet, Take 5 mg by mouth as directed. Myna Hidalgo [facility], Disp: , Rfl:  .  warfarin (COUMADIN) 7.5 MG tablet, Take 7.5 mg by mouth as directed. Wed fri, Disp: , Rfl:   Past Medical History: Past Medical History  Diagnosis Date  . Shortness of breath dyspnea   . Hypertension   . CHF (congestive heart failure) ([place])   . GERD (gastroesophageal reflux disease)   . Arthritis     Tobacco Use: History  Smoking status   . Former Smoker  Smokeless tobacco  . Never Used    Labs: Recent Review Flowsheet Data    There is no flowsheet data to display.       Exercise Target Goals:    Exercise Program Goal: Individual exercise prescription set with THRR, safety & activity barriers. Participant demonstrates ability to understand and report RPE using BORG scale, to self-measure pulse accurately, and to acknowledge the importance of the exercise prescription.  Exercise Prescription Goal: Starting with aerobic activity 30 plus minutes a day, 3 days per week for initial exercise prescription. Provide home exercise prescription and guidelines that participant acknowledges understanding prior to discharge.  Activity Barriers & Risk Stratification:     Activity Barriers & Cardiac Risk Stratification - 09/25/15 1531    Activity Barriers & Cardiac Risk Stratification   Activity Barriers Arthritis;Joint Problems   Cardiac Risk Stratification High      6 Minute Walk:     6 Minute Walk      09/25/15 1509       6 Minute Walk   Phase Initial  NuStep Test      Distance 413 feet  Nu Step Test Level 1 with arms     Walk Time 6 minutes     #  of Rest Breaks 0     RPE 13     Symptoms No     Resting HR 111 bpm     Resting BP 132/60 mmHg     Max Ex. HR 116 bpm     Max Ex. BP 138/54 mmHg        Initial Exercise Prescription:     Initial Exercise Prescription - 09/25/15 1500    Date of Initial Exercise Prescription   Date 09/25/15   Treadmill   MPH 1  Use caution with treadmill and small time intervals   Grade 0   Minutes 3   Recumbant Bike   Level 2   RPM 40   Watts 15   Minutes 10   NuStep   Level 2   Watts 20   Minutes 15   Arm Ergometer   Level 1   Watts 10   Minutes 15   Arm/Foot Ergometer   Level 4   Watts 12   Minutes 15   Recumbant Elliptical   Level 1   RPM 35   Watts 10   Minutes 10   REL-XR   Level 2   Watts 30   Minutes 15   T5 Nustep   Level 2   Watts 15    Minutes 15   Biostep-RELP   Level 2   Watts 15   Minutes 15   Prescription Details   Frequency (times per week) 3   Duration Progress to 30 minutes of continuous aerobic without signs/symptoms of physical distress   Intensity   THRR REST +  30   Ratings of Perceived Exertion 11-15   Progression   Progression Continue progressive overload as per policy without signs/symptoms or physical distress.   Resistance Training   Training Prescription Yes   Weight 2   Reps 10-15      Perform Capillary Blood Glucose checks as needed.  Exercise Prescription Changes:   Exercise Comments:   Discharge Exercise Prescription (Final Exercise Prescription Changes):   Nutrition:  Target Goals: Understanding of nutrition guidelines, daily intake of sodium 1500mg , cholesterol 200mg , calories 30% from fat and 7% or less from saturated fats, daily to have 5 or more servings of fruits and vegetables.  Biometrics:     Pre Biometrics - 09/25/15 1508    Pre Biometrics   Height 5' 5.5" (1.664 m)   Weight 186 lb 1.6 oz (84.414 kg)   Waist Circumference 34 inches   Hip Circumference 42 inches   Waist to Hip Ratio 0.81 %   BMI (Calculated) 30.6       Nutrition Therapy Plan and Nutrition Goals:     Nutrition Therapy & Goals - 09/25/15 1535    Nutrition Therapy   Drug/Food Interactions Coumadin/Vit K   Personal Nutrition Goals   Personal Goal #1 Using less salt   Intervention Plan   Intervention Prescribe, educate and counsel regarding individualized specific dietary modifications aiming towards targeted core components such as weight, hypertension, lipid management, diabetes, heart failure and other comorbidities.   Expected Outcomes Short Term Goal: Understand basic principles of dietary content, such as calories, fat, sodium, cholesterol and nutrients.;Long Term Goal: Adherence to prescribed nutrition plan.      Nutrition Discharge: Rate Your Plate Scores:   Nutrition Goals  Re-Evaluation:   Psychosocial: Target Goals: Acknowledge presence or absence of depression, maximize coping skills, provide positive support system. Participant is able to verbalize types and ability to use techniques and skills needed for  reducing stress and depression.  Initial Review & Psychosocial Screening:     Initial Psych Review & Screening - 09/25/15 [place]? Yes      Quality of Life Scores:     Quality of Life - 09/25/15 1541    Quality of Life Scores   Health/Function Pre 19.5 %   Socioeconomic Pre 26.75 %   Psych/Spiritual Pre 20.64 %   Family Pre 24.88 %   GLOBAL Pre 21.59 %      PHQ-9:     Recent Review Flowsheet Data    Depression screen [facility] 2/9 09/25/2015 01/05/2015   Decreased Interest 0 0   Down, Depressed, Hopeless 0 0   PHQ - 2 Score 0 0   Altered sleeping 3 1   Tired, decreased energy 3 1   Change in appetite 0 0   Feeling bad or failure about yourself  0 0   Trouble concentrating 0 0   Moving slowly or fidgety/restless 0 0   Suicidal thoughts 0 0   PHQ-9 Score 6 2   Difficult doing work/chores Somewhat difficult -      Psychosocial Evaluation and Intervention:   Psychosocial Re-Evaluation:   Vocational Rehabilitation: Provide vocational rehab assistance to qualifying candidates.   Vocational Rehab Evaluation & Intervention:     Vocational Rehab - 09/25/15 1532    Initial Vocational Rehab Evaluation & Intervention   Assessment shows need for Vocational Rehabilitation No      Education: Education Goals: Education classes will be provided on a weekly basis, covering required topics. Participant will state understanding/return demonstration of topics presented.  Learning Barriers/Preferences:   Education Topics: General Nutrition Guidelines/Fats and Fiber: -Group instruction provided by verbal, written material, models and posters to present the general guidelines for heart healthy  nutrition. Gives an explanation and review of dietary fats and fiber.   Controlling Sodium/Reading Food Labels: -Group verbal and written material supporting the discussion of sodium use in heart healthy nutrition. Review and explanation with models, verbal and written materials for utilization of the food label.          Cardiac Rehab from 09/25/2015 in [facility] Cardiac and Pulmonary Rehab   Date  12/26/14   Educator  CR   Instruction Review Code  2- meets goals/outcomes      Exercise Physiology & Risk Factors: - Group verbal and written instruction with models to review the exercise physiology of the cardiovascular system and associated critical values. Details cardiovascular disease risk factors and the goals associated with each risk factor.      Cardiac Rehab from 09/25/2015 in [facility] Cardiac and Pulmonary Rehab   Date  11/16/14   Educator  RM   Instruction Review Code  2- meets goals/outcomes      Aerobic Exercise & Resistance Training: - Gives group verbal and written discussion on the health impact of inactivity. On the components of aerobic and resistive training programs and the benefits of this training and how to safely progress through these programs.      Cardiac Rehab from 09/25/2015 in [facility] Cardiac and Pulmonary Rehab   Date  11/23/14   Educator  RM   Instruction Review Code  2- meets goals/outcomes      Flexibility, Balance, General Exercise Guidelines: - Provides group verbal and written instruction on the benefits of flexibility and balance training programs. Provides general exercise guidelines with specific guidelines to those with heart or lung disease. Demonstration and  skill practice provided.      Cardiac Rehab from 09/25/2015 in [facility] Cardiac and Pulmonary Rehab   Date  11/21/14   Educator  SB   Instruction Review Code  2- meets goals/outcomes      Stress Management: - Provides group verbal and written instruction about the health risks of elevated stress,  cause of high stress, and healthy ways to reduce stress.      Cardiac Rehab from 09/25/2015 in [facility] Cardiac and Pulmonary Rehab   Date  11/30/14   Educator  [facility]   Instruction Review Code  2- meets goals/outcomes      Depression: - Provides group verbal and written instruction on the correlation between heart/lung disease and depressed mood, treatment options, and the stigmas associated with seeking treatment.      Cardiac Rehab from 09/25/2015 in [facility] Cardiac and Pulmonary Rehab   Date  11/09/14   Educator  [facility]   Instruction Review Code  2- meets goals/outcomes      Anatomy & Physiology of the Heart: - Group verbal and written instruction and models provide basic cardiac anatomy and physiology, with the coronary electrical and arterial systems. Review of: AMI, Angina, Valve disease, Heart Failure, Cardiac Arrhythmia, Pacemakers, and the ICD.      Cardiac Rehab from 09/25/2015 in [facility] Cardiac and Pulmonary Rehab   Date  12/05/14   Educator  SB   Instruction Review Code  2- meets goals/outcomes      Cardiac Procedures: - Group verbal and written instruction and models to describe the testing methods done to diagnose heart disease. Reviews the outcomes of the test results. Describes the treatment choices: Medical Management, Angioplasty, or Coronary Bypass Surgery.      Cardiac Rehab from 09/25/2015 in [facility] Cardiac and Pulmonary Rehab   Date  11/07/14   Educator  Foster Simpson RN BSN CCRP   Instruction Review Code  2- meets goals/outcomes      Cardiac Medications: - Group verbal and written instruction to review commonly prescribed medications for heart disease. Reviews the medication, class of the drug, and side effects. Includes the steps to properly store meds and maintain the prescription regimen.      Cardiac Rehab from 09/25/2015 in [facility] Cardiac and Pulmonary Rehab   Date  11/14/14   Educator  Foster Simpson RN CCRP   Instruction Review Code  2- meets goals/outcomes      Go Sex-Intimacy  & Heart Disease, Get SMART - Goal Setting: - Group verbal and written instruction through game format to discuss heart disease and the return to sexual intimacy. Provides group verbal and written material to discuss and apply goal setting through the application of the S.M.A.R.T. Method.      Cardiac Rehab from 09/25/2015 in [facility] Cardiac and Pulmonary Rehab   Date  11/07/14   Educator  Foster Simpson RN BSN CCRP   Instruction Review Code  2- meets goals/outcomes      Other Matters of the Heart: - Provides group verbal, written materials and models to describe Heart Failure, Angina, Valve Disease, and Diabetes in the realm of heart disease. Includes description of the disease process and treatment options available to the cardiac patient.      Cardiac Rehab from 09/25/2015 in [facility] Cardiac and Pulmonary Rehab   Date  12/14/14   Educator  CE   Instruction Review Code  2- meets goals/outcomes      Exercise & Equipment Safety: - Individual verbal instruction and demonstration of equipment use  and safety with use of the equipment.      Cardiac Rehab from 09/25/2015 in [facility] Cardiac and Pulmonary Rehab   Date  09/25/15   Educator  C. EnterkinRN   Instruction Review Code  1- partially meets, needs review/practice      Infection Prevention: - Provides verbal and written material to individual with discussion of infection control including proper hand washing and proper equipment cleaning during exercise session.      Cardiac Rehab from 09/25/2015 in [facility] Cardiac and Pulmonary Rehab   Date  09/25/15   Educator  C. EnterkinRN   Instruction Review Code  2- meets goals/outcomes      Falls Prevention: - Provides verbal and written material to individual with discussion of falls prevention and safety.      Cardiac Rehab from 09/25/2015 in [facility] Cardiac and Pulmonary Rehab   Date  09/25/15   Educator  C. Fisher   Instruction Review Code  1- partially meets, needs review/practice      Diabetes: -  Individual verbal and written instruction to review signs/symptoms of diabetes, desired ranges of glucose level fasting, after meals and with exercise. Advice that pre and post exercise glucose checks will be done for 3 sessions at entry of program.    Knowledge Questionnaire Score:     Knowledge Questionnaire Score - 09/25/15 1532    Knowledge Questionnaire Score   Pre Score 18      Core Components/Risk Factors/Patient Goals at Admission:     Personal Goals and Risk Factors at Admission - 09/25/15 1536    Core Components/Risk Factors/Patient Goals on Admission   Increase Strength and Stamina Yes   Intervention Provide advice, education, support and counseling about physical activity/exercise needs.;Develop an individualized exercise prescription for aerobic and resistive training based on initial evaluation findings, risk stratification, comorbidities and participant's personal goals.   Expected Outcomes Achievement of increased cardiorespiratory fitness and enhanced flexibility, muscular endurance and strength shown through measurements of functional capacity and personal statement of participant.   Heart Failure Yes   Intervention Provide a combined exercise and nutrition program that is supplemented with education, support and counseling about heart failure. Directed toward relieving symptoms such as shortness of breath, decreased exercise tolerance, and extremity edema.   Expected Outcomes Improve functional capacity of life;Short term: Attendance in program 2-3 days a week with increased exercise capacity. Reported lower sodium intake. Reported increased fruit and vegetable intake. Reports medication compliance.;Short term: Daily weights obtained and reported for increase. Utilizing diuretic protocols set by physician.;Long term: Adoption of self-care skills and reduction of barriers for early signs and symptoms recognition and intervention leading to self-care maintenance.      Core  Components/Risk Factors/Patient Goals Review:    Core Components/Risk Factors/Patient Goals at Discharge (Final Review):    ITP Comments:     ITP Comments      09/26/15 0825           ITP Comments 30 day review   Continue with ITP    New start to program has attended medical review orientation.          Comments:

## 2015-09-26 NOTE — Telephone Encounter (Signed)
Patients daughter called and just wanted to get some ideas on what to do about her mother. Patient had a hematoma removed on her leg back in December 2016 and for the past 2 months at night when she lays down to go to sleep she is in so much pain and can not sleep. She has been taking Tramadol and hydrocodone to help with the pain but its not working. Patient will take the medicine at 9 or 10pm and by 1am she is awake with pain.

## 2015-09-27 ENCOUNTER — Encounter: Payer: Medicare Other | Admitting: *Deleted

## 2015-09-27 DIAGNOSIS — Z952 Presence of prosthetic heart valve: Secondary | ICD-10-CM

## 2015-09-27 NOTE — Telephone Encounter (Signed)
I spoke with [name], the patient started yesterday. She reports her mother is getting up at 1 the morning with complaints of leg pain. She goes down and puts her legs up in the recliner. Pain is worse after long periods of rest.  Clinical condition reviewed with Ella Jubilee, M.D. from vascular suggestive of spinal stenosis.  Above information reviewed with [name], M.D. this morning. He'll make arrangements for the patient be evaluated.  The patient might be a candidate for Lyrica. Creatinine clearance 54 mL/m, estimated GFR 48, dosage would likely be decreased based on her renal function. Only medical contraindication is that both the losartan and Lyrica can cause angioedema.  Message left for both the patient and her daughter with plans for internal medicine follow-up.

## 2015-09-27 NOTE — Progress Notes (Signed)
Daily Session Note  Patient Details  Name: [name] MRN: [id number] Date of Birth: [birth year] Referring Provider:  Mackie Pai*  Encounter Date: 09/27/2015  Check In:     Session Check In - 09/27/15 1623    Check-In   Location [facility]-Cardiac & Pulmonary Rehab   Staff Present Candiss Norse, MS, ACSM CEP, Exercise Physiologist;Jamesha Ellsworth Joya Gaskins, RN, BSN;Carroll Enterkin, RN, BSN   Supervising physician immediately available to respond to emergencies See telemetry face sheet for immediately available ER MD   Medication changes reported     No   Fall or balance concerns reported    No   Warm-up and Cool-down Performed on first and last piece of equipment   Resistance Training Performed No   VAD Patient? No   Pain Assessment   Currently in Pain? No/denies         Goals Met:  Proper associated with RPD/PD & O2 Sat Exercise tolerated well No report of cardiac concerns or symptoms Strength training completed today  Goals Unmet:  Not Applicable  Comments:  First day of exercise! Patient was oriented to the gym and the equipment functions and settings. Procedures and policies of the gym were outlined and explained. The patient's individual exercise prescription and treatment plan were reviewed with them. All starting workloads were established based on the results of the functional testing  done at the initial intake visit. The plan for exercise progression was also introduced and progression will be customized based on the patient's performance and goals.   Dr. [name] is Medical Director for [facility].

## 2015-09-28 ENCOUNTER — Encounter: Payer: Self-pay | Admitting: *Deleted

## 2015-09-28 ENCOUNTER — Other Ambulatory Visit: Payer: Self-pay

## 2015-09-28 ENCOUNTER — Inpatient Hospital Stay
Admission: EM | Admit: 2015-09-28 | Discharge: 2015-10-10 | DRG: 291 | Disposition: A | Discharge status: Home Health | Payer: Medicare Other | Attending: Internal Medicine | Admitting: Internal Medicine

## 2015-09-28 ENCOUNTER — Emergency Department: Payer: Medicare Other

## 2015-09-28 DIAGNOSIS — N189 Chronic kidney disease, unspecified: Secondary | ICD-10-CM | POA: Diagnosis present

## 2015-09-28 DIAGNOSIS — E875 Hyperkalemia: Secondary | ICD-10-CM | POA: Diagnosis present

## 2015-09-28 DIAGNOSIS — I429 Cardiomyopathy, unspecified: Secondary | ICD-10-CM | POA: Diagnosis present

## 2015-09-28 DIAGNOSIS — I959 Hypotension, unspecified: Secondary | ICD-10-CM | POA: Diagnosis present

## 2015-09-28 DIAGNOSIS — Z954 Presence of other heart-valve replacement: Secondary | ICD-10-CM | POA: Diagnosis not present

## 2015-09-28 DIAGNOSIS — E871 Hypo-osmolality and hyponatremia: Secondary | ICD-10-CM | POA: Diagnosis present

## 2015-09-28 DIAGNOSIS — I890 Lymphedema, not elsewhere classified: Secondary | ICD-10-CM

## 2015-09-28 DIAGNOSIS — M7989 Other specified soft tissue disorders: Secondary | ICD-10-CM

## 2015-09-28 DIAGNOSIS — E785 Hyperlipidemia, unspecified: Secondary | ICD-10-CM | POA: Diagnosis present

## 2015-09-28 DIAGNOSIS — R0600 Dyspnea, unspecified: Secondary | ICD-10-CM | POA: Diagnosis not present

## 2015-09-28 DIAGNOSIS — N179 Acute kidney failure, unspecified: Secondary | ICD-10-CM | POA: Diagnosis present

## 2015-09-28 DIAGNOSIS — Z888 Allergy status to other drugs, medicaments and biological substances status: Secondary | ICD-10-CM | POA: Diagnosis not present

## 2015-09-28 DIAGNOSIS — Z7901 Long term (current) use of anticoagulants: Secondary | ICD-10-CM | POA: Diagnosis not present

## 2015-09-28 DIAGNOSIS — I5043 Acute on chronic combined systolic (congestive) and diastolic (congestive) heart failure: Secondary | ICD-10-CM | POA: Diagnosis present

## 2015-09-28 DIAGNOSIS — Z87891 Personal history of nicotine dependence: Secondary | ICD-10-CM

## 2015-09-28 DIAGNOSIS — Z880 Allergy status to penicillin: Secondary | ICD-10-CM

## 2015-09-28 DIAGNOSIS — I272 Other secondary pulmonary hypertension: Secondary | ICD-10-CM | POA: Diagnosis present

## 2015-09-28 DIAGNOSIS — N183 Chronic kidney disease, stage 3 (moderate): Secondary | ICD-10-CM

## 2015-09-28 DIAGNOSIS — I5041 Acute combined systolic (congestive) and diastolic (congestive) heart failure: Secondary | ICD-10-CM | POA: Diagnosis not present

## 2015-09-28 DIAGNOSIS — Z823 Family history of stroke: Secondary | ICD-10-CM

## 2015-09-28 DIAGNOSIS — Z952 Presence of prosthetic heart valve: Secondary | ICD-10-CM | POA: Diagnosis not present

## 2015-09-28 DIAGNOSIS — R600 Localized edema: Secondary | ICD-10-CM | POA: Diagnosis not present

## 2015-09-28 DIAGNOSIS — I481 Persistent atrial fibrillation: Secondary | ICD-10-CM | POA: Diagnosis present

## 2015-09-28 DIAGNOSIS — I872 Venous insufficiency (chronic) (peripheral): Secondary | ICD-10-CM | POA: Diagnosis present

## 2015-09-28 DIAGNOSIS — I509 Heart failure, unspecified: Secondary | ICD-10-CM

## 2015-09-28 DIAGNOSIS — I130 Hypertensive heart and chronic kidney disease with heart failure and stage 1 through stage 4 chronic kidney disease, or unspecified chronic kidney disease: Secondary | ICD-10-CM | POA: Diagnosis present

## 2015-09-28 DIAGNOSIS — I4891 Unspecified atrial fibrillation: Secondary | ICD-10-CM | POA: Diagnosis not present

## 2015-09-28 DIAGNOSIS — N184 Chronic kidney disease, stage 4 (severe): Secondary | ICD-10-CM | POA: Diagnosis present

## 2015-09-28 DIAGNOSIS — Z7982 Long term (current) use of aspirin: Secondary | ICD-10-CM | POA: Diagnosis not present

## 2015-09-28 HISTORY — DX: Chronic atrial fibrillation, unspecified: I48.20

## 2015-09-28 HISTORY — DX: Chronic combined systolic (congestive) and diastolic (congestive) heart failure: I50.42

## 2015-09-28 HISTORY — DX: Presence of prosthetic heart valve: Z95.2

## 2015-09-28 LAB — BASIC METABOLIC PANEL
ANION GAP: 9 (ref 5–15)
BUN: 84 mg/dL — ABNORMAL HIGH (ref 6–20)
CALCIUM: 9.5 mg/dL (ref 8.9–10.3)
CO2: 23 mmol/L (ref 22–32)
CREATININE: 2.29 mg/dL — AB (ref 0.44–1.00)
Chloride: 101 mmol/L (ref 101–111)
GFR calc Af Amer: 22 mL/min — ABNORMAL LOW (ref >60)
GFR calc non Af Amer: 19 mL/min — ABNORMAL LOW (ref >60)
GLUCOSE: 94 mg/dL (ref 65–99)
Potassium: 5.2 mmol/L — ABNORMAL HIGH (ref 3.5–5.1)
Sodium: 133 mmol/L — ABNORMAL LOW (ref 135–145)

## 2015-09-28 LAB — CBC
HCT: 37.2 % (ref 35.0–47.0)
HEMOGLOBIN: 12.1 g/dL (ref 12.0–16.0)
MCH: 26.5 pg (ref 26.0–34.0)
MCHC: 32.6 g/dL (ref 32.0–36.0)
MCV: 81.5 fL (ref 80.0–100.0)
Platelets: 150 10*3/uL (ref 150–440)
RBC: 4.56 MIL/uL (ref 3.80–5.20)
RDW: 20.1 % — ABNORMAL HIGH (ref 11.5–14.5)
WBC: 4.9 10*3/uL (ref 3.6–11.0)

## 2015-09-28 LAB — TROPONIN I

## 2015-09-28 LAB — PROTIME-INR
INR: 2.79
Prothrombin Time: 29 seconds — ABNORMAL HIGH (ref 11.4–15.0)

## 2015-09-28 LAB — BRAIN NATRIURETIC PEPTIDE: B Natriuretic Peptide: 559 pg/mL — ABNORMAL HIGH (ref 0.0–100.0)

## 2015-09-28 MED ORDER — WARFARIN SODIUM 5 MG PO TABS
7.5000 mg | ORAL_TABLET | ORAL | Status: DC
  Administered 2015-09-29 – 2015-10-04 (×2): 7.5 mg via ORAL
  Filled 2015-09-28 (×2): qty 2

## 2015-09-28 MED ORDER — SODIUM POLYSTYRENE SULFONATE 15 GM/60ML PO SUSP
30.0000 g | Freq: Once | ORAL | Status: DC
  Filled 2015-09-28: qty 120

## 2015-09-28 MED ORDER — ADULT MULTIVITAMIN W/MINERALS CH
1.0000 | ORAL_TABLET | Freq: Every day | ORAL | Status: DC
  Administered 2015-09-29 – 2015-10-10 (×12): 1 via ORAL
  Filled 2015-09-28 (×12): qty 1

## 2015-09-28 MED ORDER — ACETAMINOPHEN 500 MG PO TABS
500.0000 mg | ORAL_TABLET | Freq: Four times a day (QID) | ORAL | Status: DC | PRN
  Administered 2015-09-28 – 2015-10-04 (×11): 500 mg via ORAL
  Filled 2015-09-28 (×14): qty 1

## 2015-09-28 MED ORDER — VITAMIN C 500 MG PO TABS: 500.0000 mg | ORAL_TABLET | Freq: Every day | ORAL | Status: DC | PRN

## 2015-09-28 MED ORDER — FUROSEMIDE 10 MG/ML IJ SOLN
40.0000 mg | Freq: Once | INTRAMUSCULAR | Status: AC
  Administered 2015-09-28: 40 mg via INTRAVENOUS
  Filled 2015-09-28: qty 4

## 2015-09-28 MED ORDER — TRAMADOL HCL 50 MG PO TABS
50.0000 mg | ORAL_TABLET | Freq: Four times a day (QID) | ORAL | Status: DC | PRN
  Administered 2015-09-28 – 2015-10-04 (×9): 50 mg via ORAL
  Filled 2015-09-28 (×9): qty 1

## 2015-09-28 MED ORDER — WARFARIN SODIUM 5 MG PO TABS
5.0000 mg | ORAL_TABLET | Freq: Once | ORAL | Status: AC
  Administered 2015-09-28: 5 mg via ORAL
  Filled 2015-09-28: qty 1

## 2015-09-28 MED ORDER — WARFARIN SODIUM 5 MG PO TABS: 5.0000 mg | ORAL_TABLET | ORAL | Status: DC

## 2015-09-28 MED ORDER — FUROSEMIDE 10 MG/ML IJ SOLN
40.0000 mg | Freq: Two times a day (BID) | INTRAMUSCULAR | Status: DC
Start: 2015-09-29 — End: 2015-10-05
  Administered 2015-09-29 – 2015-10-04 (×11): 40 mg via INTRAVENOUS
  Filled 2015-09-28 (×14): qty 4

## 2015-09-28 MED ORDER — WARFARIN SODIUM 5 MG PO TABS: 7.5000 mg | ORAL_TABLET | ORAL | Status: DC

## 2015-09-28 MED ORDER — ACETAMINOPHEN 500 MG PO TABS
500.0000 mg | ORAL_TABLET | Freq: Once | ORAL | Status: AC
  Administered 2015-09-28: 500 mg via ORAL
  Filled 2015-09-28: qty 1

## 2015-09-28 MED ORDER — METOPROLOL SUCCINATE ER 100 MG PO TB24
100.0000 mg | ORAL_TABLET | Freq: Every day | ORAL | Status: DC
  Administered 2015-09-29 – 2015-09-30 (×2): 100 mg via ORAL
  Filled 2015-09-28 (×2): qty 1

## 2015-09-28 MED ORDER — WARFARIN SODIUM 5 MG PO TABS
5.0000 mg | ORAL_TABLET | ORAL | Status: DC
  Administered 2015-09-30 – 2015-10-03 (×4): 5 mg via ORAL
  Filled 2015-09-28 (×4): qty 1

## 2015-09-28 MED ORDER — PANTOPRAZOLE SODIUM 40 MG PO TBEC
40.0000 mg | DELAYED_RELEASE_TABLET | Freq: Every day | ORAL | Status: DC
  Administered 2015-09-29 – 2015-10-09 (×11): 40 mg via ORAL
  Filled 2015-09-28 (×12): qty 1

## 2015-09-28 MED ORDER — ASPIRIN EC 81 MG PO TBEC
81.0000 mg | DELAYED_RELEASE_TABLET | Freq: Every day | ORAL | Status: DC
  Administered 2015-09-29 – 2015-10-10 (×12): 81 mg via ORAL
  Filled 2015-09-28 (×11): qty 1

## 2015-09-28 MED ORDER — WARFARIN - PHARMACIST DOSING INPATIENT
Freq: Every day | Status: DC
  Administered 2015-09-30 – 2015-10-07 (×5)

## 2015-09-28 NOTE — ED Notes (Signed)
Pt sent from PCP for continued BL LE swelling with increased SOB over the past several weeks.Marland Kitchen

## 2015-09-28 NOTE — Progress Notes (Signed)
ANTICOAGULATION CONSULT NOTE - Initial Consult  Pharmacy Consult for Warfarin Indication: Chronic Afib with Mitral Valve Replacement  Allergies  Allergen Reactions  . Ace Inhibitors     Other reaction(s): Cough  . Hydrocodone Itching and Nausea Only  . Mercury     Other reaction(s): Unknown  . Nickel   . Silver Dermatitis    Severe itching  . Cephalexin Rash  . Penicillins Rash    Patient Measurements: Height: 5' 8.5" (174 cm) Weight: 189 lb (85.73 kg) IBW/kg (Calculated) : 65.05 Heparin Dosing Weight:   Vital Signs: Temp: 97.8 F (36.6 C) (03/30 2001) Temp Source: Oral (03/30 2001) BP: 96/57 mmHg (03/30 2001) Pulse Rate: 127 (03/30 2001)  Labs:  Recent Labs  09/28/15 1233  HGB 12.1  HCT 37.2  PLT 150  LABPROT 29.0*  INR 2.79  CREATININE 2.29*  TROPONINI <0.03    Estimated Creatinine Clearance: 23.4 mL/min (by C-G formula based on Cr of 2.29).   Medical History: Past Medical History  Diagnosis Date  . Shortness of breath dyspnea   . Hypertension   . CHF (congestive heart failure) ([place])   . GERD (gastroesophageal reflux disease)   . Arthritis     Medications:  Scheduled:  . acetaminophen  500 mg Oral Once  . [START ON 09/29/2015] aspirin EC  81 mg Oral Daily  . [START ON 09/29/2015] furosemide  40 mg Intravenous BID  . [START ON 09/29/2015] metoprolol succinate  100 mg Oral Daily  . [START ON 09/29/2015] multivitamin with minerals  1 tablet Oral Daily  . pantoprazole  40 mg Oral Daily  . sodium polystyrene  30 g Oral Once  . [START ON 09/30/2015] warfarin  5 mg Oral Once per day on Sun Mon Tue Thu Sat  . warfarin  5 mg Oral Once  . [START ON 09/29/2015] warfarin  7.5 mg Oral Once per day on Wed Fri  . [START ON 09/29/2015] Warfarin - Pharmacist Dosing Inpatient   Does not apply q1800    Assessment: 81 yo admitted with SOB, pulmonary edema, CHF.  Hx chronic atrial fibrillation on Coumadin, mitral valve replacement. Warfarin dose at home per PTA meds:   5 mg Mon, Tues, Thur, Sat,Sun  And 7.5mg   Wed, Fri.  3/30  INR 2.79  Goal of Therapy:  INR 2.5-3.5 Monitor platelets by anticoagulation protocol: Yes   Plan:  Continue current home regimen. F/u INR in am.  Damian Buckles A 09/28/2015,8:51 PM

## 2015-09-28 NOTE — ED Notes (Signed)
Pt family out to nurses station inquiring about pt leaving and returning when inpatient bed is ready. Informed that this is not typical procedure but will check with MD. MD informed. Pt family appears upset that pt has had to wait in ER for "8 hours" per female family member.

## 2015-09-28 NOTE — H&P (Signed)
[facility] [place] NAME: [name]    MR#:  [id number]  DATE OF BIRTH:  1934/10/21  DATE OF ADMISSION:  09/28/2015  PRIMARY CARE PHYSICIAN: Rusty Aus, MD   REQUESTING/REFERRING PHYSICIAN: kinner  CHIEF COMPLAINT:  Shortness of breath, leg swelling and weight gain  HISTORY OF PRESENT ILLNESS:  [name]  is a 81 y.o. female with a known history of chronic systolic congestive heart failure with ejection fraction 25%, chronic atrial fibrillation on Coumadin, mitral valve replacement and multiple other medical problems went to see her primary care physician Dr. Sabra Heck for worsening of shortness of breath, lower extent is swelling and 29 pounds weight gain in the past 2 weeks. The patient was admitted to [facility] 1 month ago with similar symptoms and she is to cardiology as an outpatient. Denies any chest pain here. Chest x-ray has revealed pulmonary edema. Patient was given IV Lasix 40 mg and hospitalist team is called to admit the patient.  PAST MEDICAL HISTORY:   Past Medical History  Diagnosis Date  . Shortness of breath dyspnea   . Hypertension   . CHF (congestive heart failure) ([place])   . GERD (gastroesophageal reflux disease)   . Arthritis     PAST SURGICAL HISTOIRY:   Past Surgical History  Procedure Laterality Date  . Cardiac catheterization    . Pyloric stenosis    . Cataract extraction w/phaco Right 02/20/2015    Procedure: CATARACT EXTRACTION PHACO AND INTRAOCULAR LENS PLACEMENT (IOC);  Surgeon: Estill Cotta, MD;  Location: [facility] ORS;  Service: Ophthalmology;  Laterality: Right;  US01:31.2 AP 25.3% CDE40.13 Fluid lot # [id number]  . Breast surgery Right 2005?    benign lump excision  . Cardiac valve replacement  1994, 2016  . Irrigation and debridement hematoma Left 07/05/2015    Procedure: IRRIGATION AND DEBRIDEMENT HEMATOMA;  Surgeon: Robert Bellow, MD;  Location: [facility] ORS;   Service: General;  Laterality: Left;  . US echocardiography      SOCIAL HISTORY:   Social History  Substance Use Topics  . Smoking status: Former Research scientist (life sciences)  . Smokeless tobacco: Never Used  . Alcohol Use: No     Comment: occasional    FAMILY HISTORY:   Family History  Problem Relation Age of Onset  . Stroke Mother   . Renal Disease Father     DRUG ALLERGIES:   Allergies  Allergen Reactions  . Ace Inhibitors     Other reaction(s): Cough  . Hydrocodone Itching and Nausea Only  . Mercury     Other reaction(s): Unknown  . Nickel   . Silver Dermatitis    Severe itching  . Cephalexin Rash  . Penicillins Rash    REVIEW OF SYSTEMS:  CONSTITUTIONAL: No fever, fatigue or weakness.  EYES: No blurred or double vision.  EARS, NOSE, AND THROAT: No tinnitus or ear pain.  RESPIRATORY: No cough, reporting shortness of breath, no wheezing or hemoptysis.  CARDIOVASCULAR: No chest pain, orthopnea, reporting weight gain and lower extent is swelling GASTROINTESTINAL: No nausea, vomiting, diarrhea or abdominal pain.  GENITOURINARY: No dysuria, hematuria.  ENDOCRINE: No polyuria, nocturia,  HEMATOLOGY: No anemia, easy bruising or bleeding SKIN: No rash or lesion. MUSCULOSKELETAL: No joint pain or arthritis.   NEUROLOGIC: No tingling, numbness, weakness.  PSYCHIATRY: No anxiety or depression.   MEDICATIONS AT HOME:   Prior to Admission medications   Medication Sig Start Date End Date Taking? Authorizing Provider  acetaminophen (TYLENOL) 500 MG tablet Take 500 mg by mouth every 6 (six) hours as needed for mild pain, moderate pain, fever or headache.    Historical Provider, MD  aspirin EC 81 MG tablet Take 81 mg by mouth daily.     Historical Provider, MD  losartan (COZAAR) 25 MG tablet TAKE 1 TABLET (25 MG TOTAL) BY MOUTH ONCE DAILY. 08/18/15   Historical Provider, MD  metoprolol succinate (TOPROL-XL) 100 MG 24 hr tablet Take 100 mg by mouth daily. Take with or immediately following a  meal.    Historical Provider, MD  Multiple Vitamin (MULTI-VITAMINS) TABS Take 1 tablet by mouth daily.     Historical Provider, MD  pantoprazole (PROTONIX) 40 MG tablet Take 40 mg by mouth daily.     Historical Provider, MD  spironolactone (ALDACTONE) 25 MG tablet Take 1 tablet by mouth 2 (two) times daily.  10/03/14 10/03/15  Historical Provider, MD  torsemide (DEMADEX) 20 MG tablet TAKE 2 TABLETS (40 MG TOTAL) BY MOUTH 2 times  DAILY. 08/18/15   Historical Provider, MD  traMADol (ULTRAM) 50 MG tablet Take by mouth every 6 (six) hours as needed.    Historical Provider, MD  vitamin C (ASCORBIC ACID) 500 MG tablet Take 500 mg by mouth daily as needed (for cold symptoms).     Historical Provider, MD  warfarin (COUMADIN) 5 MG tablet Take 5 mg by mouth as directed. Myna Hidalgo [name]    Historical Provider, MD  warfarin (COUMADIN) 7.5 MG tablet Take 7.5 mg by mouth as directed. Wed fri    Historical Provider, MD      VITAL SIGNS:  Blood pressure 98/80, pulse 83, temperature 98 F (36.7 C), resp. rate 24, height 5' 8.5" (1.74 m), weight 85.73 kg (189 lb), SpO2 98 %.  PHYSICAL EXAMINATION:  GENERAL:  81 y.o.-year-old patient lying in the bed with no acute distress.  EYES: Pupils equal, round, reactive to light and accommodation. No scleral icterus. Extraocular muscles intact.  HEENT: Head atraumatic, normocephalic. Oropharynx and nasopharynx clear.  NECK:  Supple, no jugular venous distention. No thyroid enlargement, no tenderness.  LUNGS: Diminished breath sounds bilaterally at lower lung fields , no wheezing, positive rales,rhonchi . No use of accessory muscles of respiration.  CARDIOVASCULAR: Irregularly irregular. Positive click and murmurs, no rubs, or gallops.  ABDOMEN: Soft, nontender, nondistended. Bowel sounds present. No organomegaly or mass.  EXTREMITIES: No pedal edema, cyanosis, or clubbing.  NEUROLOGIC: Cranial nerves II through XII are intact. Muscle strength 5/5 in all extremities. Sensation  intact. Gait not checked.  PSYCHIATRIC: The patient is alert and oriented x 3.  SKIN: No obvious rash, lesion, or ulcer.   LABORATORY PANEL:   CBC  Recent Labs Lab 09/28/15 1233  WBC 4.9  HGB 12.1  HCT 37.2  PLT 150   ------------------------------------------------------------------------------------------------------------------  Chemistries   Recent Labs Lab 09/28/15 1233  NA 133*  K 5.2*  CL 101  CO2 23  GLUCOSE 94  BUN 84*  CREATININE 2.29*  CALCIUM 9.5   ------------------------------------------------------------------------------------------------------------------  Cardiac Enzymes  Recent Labs Lab 09/28/15 1233  TROPONINI <0.03   ------------------------------------------------------------------------------------------------------------------  RADIOLOGY:  Dg Chest 2 View  09/28/2015  CLINICAL DATA:  81 year old female with increased lower extremity swelling, shortness of breath for 1 day. Initial encounter. EXAM: CHEST  2 VIEW COMPARISON:  Chest radiographs 03/24/2012. FINDINGS: Larger lung volumes with resolved small pleural effusions present in 2013. Chronic cardiomegaly appears stable. Sequelae of cardiac valve replacement and median sternotomy. Other  mediastinal contours are within normal limits. Visualized tracheal air column is within normal limits. No pneumothorax. Pulmonary vascular congestion without overt edema. No confluent pulmonary opacity. Osteopenia with multilevel chronic appearing thoracic compression fractures. No acute osseous abnormality identified. IMPRESSION: Cardiomegaly and pulmonary vascular congestion without overt edema. Electronically Signed   By: Genevie Ann M.D.   On: 09/28/2015 13:13    EKG:   Orders placed or performed during the hospital encounter of 09/28/15  . ED EKG within 10 minutes  . ED EKG within 10 minutes    IMPRESSION AND PLAN:  [name]  is a 81 y.o. female with a known history of chronic systolic  congestive heart failure with ejection fraction 25%, chronic atrial fibrillation on Coumadin, mitral valve replacement and multiple other medical problems went to see her primary care physician Dr. Sabra Heck for worsening of shortness of breath, lower extent is swelling and 29 pounds weight gain in the past 2 weeks.  #1 acute respiratory distress secondary to acute exacerbation of systolic congestive heart failure Admit patient to telemetry Provide IV Lasix with close monitoring of intake and output and daily weights Holding her home medication spironolactone in view of hyperkalemia and soft blood pressure We will get an echocardiogram and cardiac consult is placed to Dr. Rockey Situ as requested by family Renal function needs to be monitored closely  #2 acute kidney injury on chronic kidney disease Baseline creatinine is 1.09 and BUN at 43 in October 2016 which is a 2.29 today with BUN at 84 Will hold off on Cozaar and Demadex from worsening of renal function as well as hypotension Nephrology consult is placed Will monitor renal function closely while patient is on IV Lasix  #3 hyperkalemia Holding spironolactone and we will provide 1 dose of Kayexalate  #4 chronic atrial fibrillation and history of mechanical mitral valve replacement-rate controlled Check PT/INR and Coumadin management per pharmacy is done the PT/INR results  #5 recent history of left lower extremity hematoma evacuation by Dr. Charissa Bash Continue wound care   #6 essential hypertension-currently blood pressure is soft Hold Cozaar, spironolactone and Demadex Continue metoprolol with holding parameters   All the records are reviewed and case discussed with ED provider. Management plans discussed with the patient, family and they are in agreement.  CODE STATUS: fc, daughter is the healthcare power of attorney  TOTAL TIME TAKING CARE OF THIS PATIENT: 45  minutes.    Nicholes Mango M.D on 09/28/2015 at 4:31 PM  Between 7am to  6pm - Pager - [phone]  After 6pm go to [email or web address] - password [email or web address] [facility]  Office  [phone]  CC: Primary care physician; Rusty Aus, MD

## 2015-09-28 NOTE — Progress Notes (Signed)
Patient request additional tylenol for pain, notified MD. New orders received.

## 2015-09-28 NOTE — ED Provider Notes (Signed)
[facility] Emergency Department Provider Note  ____________________________________________    I have reviewed the triage vital signs and the nursing notes.   HISTORY  Chief Complaint Shortness of Breath and Leg Swelling    HPI [name] is a 81 y.o. female who presents today with lower extremity swelling and shortness of breath. She was sent in by her PCP for evaluation, Dr. [name]. Patient reports she is 29 pounds over her typical weight of 160 pounds. She has edema all the way up to her thighs. She has been taking her torsemide as prescribed without improvement. She reports shortness of breath with ambulation or exertion. No chest pain. She was admitted to [facility] 1 month ago for similar symptoms. She reports her EF is approximately 25% she does have a history of mitral valve replacement and is on Coumadin. She also has a history of atrial fibrillation     Past Medical History  Diagnosis Date  . Shortness of breath dyspnea   . Hypertension   . CHF (congestive heart failure) ([place])   . GERD (gastroesophageal reflux disease)   . Arthritis     Patient Active Problem List   Diagnosis Date Noted  . Bilateral lower extremity edema 08/30/2015  . ARF (acute renal failure) ([place]) 04/28/2015  . Hyponatremia 04/28/2015  . H/O mitral valve replacement with mechanical valve 11/04/2014  . Long term current use of anticoagulant therapy 09/28/2014  . Acute on chronic diastolic heart failure ([place]) 09/19/2014  . 2Nd degree atrioventricular block 09/06/2014  . H/O prosthetic heart valve 09/05/2014  . CCF (congestive cardiac failure) ([place]) 11/28/2013  . Generalized OA 11/28/2013  . HLD (hyperlipidemia) 11/28/2013  . Atrial fibrillation with rapid ventricular response ([place]) 11/09/2013    Past Surgical History  Procedure Laterality Date  . Cardiac catheterization    . Pyloric stenosis    . Cataract extraction w/phaco Right 02/20/2015     Procedure: CATARACT EXTRACTION PHACO AND INTRAOCULAR LENS PLACEMENT (IOC);  Surgeon: Estill Cotta, MD;  Location: [facility] ORS;  Service: Ophthalmology;  Laterality: Right;  US01:31.2 AP 25.3% CDE40.13 Fluid lot # [id number]  . Breast surgery Right 2005?    benign lump excision  . Cardiac valve replacement  1994, 2016  . Irrigation and debridement hematoma Left 07/05/2015    Procedure: IRRIGATION AND DEBRIDEMENT HEMATOMA;  Surgeon:  Bellow, MD;  Location: [facility] ORS;  Service: General;  Laterality: Left;  . US echocardiography      Current Outpatient Rx  Name  Route  Sig  Dispense  Refill  . acetaminophen (TYLENOL) 500 MG tablet   Oral   Take 500 mg by mouth every 6 (six) hours as needed for mild pain, moderate pain, fever or headache.         Marland Kitchen aspirin EC 81 MG tablet   Oral   Take 81 mg by mouth daily.          Marland Kitchen losartan (COZAAR) 25 MG tablet      TAKE 1 TABLET (25 MG TOTAL) BY MOUTH ONCE DAILY.      11   . metoprolol succinate (TOPROL-XL) 100 MG 24 hr tablet   Oral   Take 100 mg by mouth daily. Take with or immediately following a meal.         . Multiple Vitamin (MULTI-VITAMINS) TABS   Oral   Take 1 tablet by mouth daily.          . pantoprazole (PROTONIX) 40 MG tablet  Oral   Take 40 mg by mouth daily.          Marland Kitchen spironolactone (ALDACTONE) 25 MG tablet   Oral   Take 1 tablet by mouth 2 (two) times daily.          Marland Kitchen torsemide (DEMADEX) 20 MG tablet      TAKE 2 TABLETS (40 MG TOTAL) BY MOUTH 2 times  DAILY.      11   . traMADol (ULTRAM) 50 MG tablet   Oral   Take by mouth every 6 (six) hours as needed.         . vitamin C (ASCORBIC ACID) 500 MG tablet   Oral   Take 500 mg by mouth daily as needed (for cold symptoms).          . warfarin (COUMADIN) 5 MG tablet   Oral   Take 5 mg by mouth as directed. Myna Hidalgo [facility]         . warfarin (COUMADIN) 7.5 MG tablet   Oral   Take 7.5 mg by mouth as directed. Wed fri            Allergies Ace inhibitors; Hydrocodone; Mercury; Nickel; Silver; Cephalexin; and Penicillins  Family History  Problem Relation Age of Onset  . Stroke Mother   . Renal Disease Father     Social History Social History  Substance Use Topics  . Smoking status: Former Research scientist (life sciences)  . Smokeless tobacco: Never Used  . Alcohol Use: No     Comment: occasional    Review of Systems  Constitutional: Negative for fever. Eyes: Negative for redness ENT: Negative for sore throat Cardiovascular: Negative for chest pain Respiratory:Positive for shortness of breath Gastrointestinal: Negative for abdominal pain Genitourinary: Negative for dysuria. Musculoskeletal: Negative for back pain. Positive for lower extremity edema Skin: Negative for rash. Neurological: Negative for focal weakness Psychiatric: no anxiety    ____________________________________________   PHYSICAL EXAM:  VITAL SIGNS: ED Triage Vitals  Enc Vitals Group     BP 09/28/15 1228 98/40 mmHg     Pulse Rate 09/28/15 1228 62     Resp 09/28/15 1228 16     Temp 09/28/15 1228 98 F (36.7 C)     Temp src --      SpO2 09/28/15 1228 97 %     Weight 09/28/15 1228 189 lb (85.73 kg)     Height 09/28/15 1228 5' 8.5" (1.74 m)     Head Cir --      Peak Flow --      Pain Score 09/28/15 1230 6     Pain Loc --      Pain Edu? --      Excl. in [place]? --      Constitutional: Alert and oriented. Well appearing and in no distress. Pleasant and interactive Eyes: Conjunctivae are normal. No erythema or injection ENT   Head: Normocephalic and atraumatic.   Mouth/Throat: Mucous membranes are moist. Cardiovascular:Irregularly irregular rhythm. Normal and symmetric distal pulses are present in the upper extremities. Prominent click of mitral valve replacement Respiratory: Normal respiratory effort without tachypnea nor retractions. Bibasilar rails Gastrointestinal: Soft and non-tender in all quadrants. No distention. There is no CVA  tenderness. Genitourinary: deferred Musculoskeletal: Nontender with normal range of motion in all extremities. 2+ edema to the level of the mid thigh bilaterally Neurologic:  Normal speech and language. No gross focal neurologic deficits are appreciated. Skin:  Skin is warm, dry and intact. No rash noted.  Psychiatric: Mood and affect are normal. Patient exhibits appropriate insight and judgment.  ____________________________________________    LABS (pertinent positives/negatives)  Labs Reviewed  BASIC METABOLIC PANEL - Abnormal; Notable for the following:    Sodium 133 (*)    Potassium 5.2 (*)    BUN 84 (*)    Creatinine, Ser 2.29 (*)    GFR calc non Af Amer 19 (*)    GFR calc Af Amer 22 (*)    All other components within normal limits  CBC - Abnormal; Notable for the following:    RDW 20.1 (*)    All other components within normal limits  BRAIN NATRIURETIC PEPTIDE - Abnormal; Notable for the following:    B Natriuretic Peptide 559.0 (*)    All other components within normal limits  TROPONIN I    ____________________________________________   EKG  ED ECG REPORT I, Lavonia Drafts, the attending physician, personally viewed and interpreted this ECG.   Date: 09/28/2015   Rate: 88  Rhythm: atrial fibrillation,   Axis: Normal  Intervals: Abnormal  ST&T Change: Nonspecific   ____________________________________________    RADIOLOGY  Chest x-ray shows pulmonary vascular congestion  ____________________________________________   PROCEDURES  Procedure(s) performed: none  Critical Care performed: one  ____________________________________________   INITIAL IMPRESSION / ASSESSMENT AND PLAN / ED COURSE  Pertinent labs & imaging results that were available during my care of the patient were reviewed by me and considered in my medical decision making (see chart for details).  Patient presents with 30 pound weight gain with significant edema and shortness of  breath consistent with CHF exacerbation. We will give Lasix 40 mg in the department. She does have a mild elevation in her creatinine consistent with acute renal failure as well. She'll require hospitalization for diuresis.  ____________________________________________   FINAL CLINICAL IMPRESSION(S) / ED DIAGNOSES  Final diagnoses:  Acute on chronic congestive heart failure, unspecified congestive heart failure type (HCC)          Lavonia Drafts, MD 09/28/15 1601

## 2015-09-29 ENCOUNTER — Encounter: Payer: Self-pay | Admitting: Student

## 2015-09-29 ENCOUNTER — Telehealth: Payer: Self-pay | Admitting: Cardiovascular Disease

## 2015-09-29 DIAGNOSIS — I5043 Acute on chronic combined systolic (congestive) and diastolic (congestive) heart failure: Secondary | ICD-10-CM

## 2015-09-29 DIAGNOSIS — Z954 Presence of other heart-valve replacement: Secondary | ICD-10-CM

## 2015-09-29 DIAGNOSIS — R600 Localized edema: Secondary | ICD-10-CM

## 2015-09-29 DIAGNOSIS — N183 Chronic kidney disease, stage 3 (moderate): Secondary | ICD-10-CM

## 2015-09-29 DIAGNOSIS — I890 Lymphedema, not elsewhere classified: Secondary | ICD-10-CM

## 2015-09-29 LAB — CREATININE, SERUM
Creatinine, Ser: 2.14 mg/dL — ABNORMAL HIGH (ref 0.44–1.00)
GFR calc Af Amer: 24 mL/min — ABNORMAL LOW (ref >60)
GFR calc non Af Amer: 21 mL/min — ABNORMAL LOW (ref >60)

## 2015-09-29 LAB — PROTIME-INR
INR: 3.13
PROTHROMBIN TIME: 31.6 s — AB (ref 11.4–15.0)

## 2015-09-29 LAB — POTASSIUM: Potassium: 4.3 mmol/L (ref 3.5–5.1)

## 2015-09-29 NOTE — Care Management Important Message (Signed)
Important Message  Patient Details  Name: [name] MRN: [id number] Date of Birth: 04/10/1935   Medicare Important Message Given:  Yes    Jolly Mango, RN 09/29/2015, 12:22 PM

## 2015-09-29 NOTE — Progress Notes (Signed)
Notified physician to speak with Son.

## 2015-09-29 NOTE — Progress Notes (Signed)
Patient does not have code status on file, Dr. Anselm Jungling notified. [name]

## 2015-09-29 NOTE — Discharge Instructions (Addendum)
1. Please restrict fluid intake to 1500cc/day or less 2. Keep the legs elevated whenever sitting or lying down- atleast heart level or higher 3. Keep Unna wraps to the lower extremities or the velcro wraps 4. Daily weight check and record it- if more than 3 pound weight gain- fluid weight gain noted- take an extra dose of lasix and call your cardiologist 5. Low salt diet- less than 2gram of sodium /day 6. Do not drive after taking the pain medicine 7. HOLD YOUR COUMADIN TODAY AND TAKE IT FROM 10/11/15 evening. 8. INR check by Home health RN in 2 days    ---------------------------------------------------------------------------------------------------------------------------------------------------------------------------- Heart Failure Clinic appointment on October 17, 2015 at 10:00am with Darylene Price, [place]. Please call [phone] to reschedule.    ---------------------------------------------------------------------------------------------------------------------------------------------------------------------- Information on my medicine - Coumadin   (Warfarin)  Why was Coumadin prescribed for you? Coumadin was prescribed for you because you have a blood clot or a medical condition that can cause an increased risk of forming blood clots. Blood clots can cause serious health problems by blocking the flow of blood to the heart, lung, or brain. Coumadin can prevent harmful blood clots from forming. As a reminder your indication for Coumadin is:   Blood Clot Prevention After Heart Valve Surgery  What test will check on my response to Coumadin? While on Coumadin (warfarin) you will need to have an INR test regularly to ensure that your dose is keeping you in the desired range. The INR (international normalized ratio) number is calculated from the result of the laboratory test called prothrombin time (PT).  If an INR APPOINTMENT HAS NOT ALREADY BEEN MADE FOR YOU please schedule an appointment to  have this lab work done by your health care provider within 7 days. Your INR goal is usually a number between:  2 to 3 or your provider may give you a more narrow range like 2-2.5.  Ask your health care provider during an office visit what your goal INR is.  What  do you need to  know  About  COUMADIN? Take Coumadin (warfarin) exactly as prescribed by your healthcare provider about the same time each day.  DO NOT stop taking without talking to the doctor who prescribed the medication.  Stopping without other blood clot prevention medication to take the place of Coumadin may increase your risk of developing a new clot or stroke.  Get refills before you run out.  What do you do if you miss a dose? If you miss a dose, take it as soon as you remember on the same day then continue your regularly scheduled regimen the next day.  Do not take two doses of Coumadin at the same time.  Important Safety Information A possible side effect of Coumadin (Warfarin) is an increased risk of bleeding. You should call your healthcare provider right away if you experience any of the following: ? Bleeding from an injury or your nose that does not stop. ? Unusual colored urine (red or dark brown) or unusual colored stools (red or black). ? Unusual bruising for unknown reasons. ? A serious fall or if you hit your head (even if there is no bleeding).  Some foods or medicines interact with Coumadin (warfarin) and might alter your response to warfarin. To help avoid this: ? Eat a balanced diet, maintaining a consistent amount of Vitamin K. ? Notify your provider about major diet changes you plan to make. ? Avoid alcohol or limit your intake to 1 drink for women and  2 drinks for men per day. (1 drink is 5 oz. wine, 12 oz. beer, or 1.5 oz. liquor.)  Make sure that ANY health care provider who prescribes medication for you knows that you are taking Coumadin (warfarin).  Also make sure the healthcare provider who is  monitoring your Coumadin knows when you have started a new medication including herbals and non-prescription products.  Coumadin (Warfarin)  Major Drug Interactions  Increased Warfarin Effect Decreased Warfarin Effect  Alcohol (large quantities) Antibiotics (esp. Septra/Bactrim, Flagyl, Cipro) Amiodarone (Cordarone) Aspirin (ASA) Cimetidine (Tagamet) Megestrol (Megace) NSAIDs (ibuprofen, naproxen, etc.) Piroxicam (Feldene) Propafenone (Rythmol SR) Propranolol (Inderal) Isoniazid (INH) Posaconazole (Noxafil) Barbiturates (Phenobarbital) Carbamazepine (Tegretol) Chlordiazepoxide (Librium) Cholestyramine (Questran) Griseofulvin Oral Contraceptives Rifampin Sucralfate (Carafate) Vitamin K   Coumadin (Warfarin) Major Herbal Interactions  Increased Warfarin Effect Decreased Warfarin Effect  Garlic Ginseng Ginkgo biloba Coenzyme Q10 Green tea St. Johns wort    Coumadin (Warfarin) FOOD Interactions  Eat a consistent number of servings per week of foods HIGH in Vitamin K (1 serving =  cup)  Collards (cooked, or boiled & drained) Kale (cooked, or boiled & drained) Mustard greens (cooked, or boiled & drained) Parsley *serving size only =  cup Spinach (cooked, or boiled & drained) Swiss chard (cooked, or boiled & drained) Turnip greens (cooked, or boiled & drained)  Eat a consistent number of servings per week of foods MEDIUM-HIGH in Vitamin K (1 serving = 1 cup)  Asparagus (cooked, or boiled & drained) Broccoli (cooked, boiled & drained, or raw & chopped) Brussel sprouts (cooked, or boiled & drained) *serving size only =  cup Lettuce, raw (green leaf, endive, romaine) Spinach, raw Turnip greens, raw & chopped   These websites have more information on Coumadin (warfarin):  FailFactory.se; VeganReport.com.au;

## 2015-09-29 NOTE — Progress Notes (Signed)
ANTICOAGULATION CONSULT NOTE - Initial Consult  Pharmacy Consult for Warfarin Indication: Chronic Afib with Mitral Valve Replacement  Allergies  Allergen Reactions  . Ace Inhibitors     Other reaction(s): Cough  . Hydrocodone Itching and Nausea Only  . Mercury     Other reaction(s): Unknown  . Nickel   . Silver Dermatitis    Severe itching  . Cephalexin Rash  . Penicillins Rash    Patient Measurements: Height: 5' 8.5" (174 cm) Weight: 184 lb (83.462 kg) IBW/kg (Calculated) : 65.05 Heparin Dosing Weight:   Vital Signs: Temp: 97.8 F (36.6 C) (03/31 0536) Temp Source: Oral (03/31 0536) BP: 93/51 mmHg (03/31 0538) Pulse Rate: 65 (03/31 0538)  Labs:  Recent Labs  09/28/15 1233 09/29/15 0442  HGB 12.1  --   HCT 37.2  --   PLT 150  --   LABPROT 29.0* 31.6*  INR 2.79 3.13  CREATININE 2.29*  --   TROPONINI <0.03  --     Estimated Creatinine Clearance: 23.2 mL/min (by C-G formula based on Cr of 2.29).   Medical History: Past Medical History  Diagnosis Date  . Shortness of breath dyspnea   . Hypertension   . CHF (congestive heart failure) ([place])   . GERD (gastroesophageal reflux disease)   . Arthritis     Medications:  Scheduled:  . aspirin EC  81 mg Oral Daily  . furosemide  40 mg Intravenous BID  . metoprolol succinate  100 mg Oral Daily  . multivitamin with minerals  1 tablet Oral Daily  . pantoprazole  40 mg Oral Daily  . sodium polystyrene  30 g Oral Once  . [START ON 09/30/2015] warfarin  5 mg Oral Once per day on Sun Mon Tue Thu Sat  . warfarin  7.5 mg Oral Once per day on Wed Fri  . Warfarin - Pharmacist Dosing Inpatient   Does not apply q1800    Assessment: 81 yo admitted with SOB, pulmonary edema, CHF.  Hx chronic atrial fibrillation on Coumadin, mitral valve replacement. Warfarin dose at home per PTA meds:  5 mg Mon, Tues, Thur, Sat,Sun  And 7.5mg   Wed, Fri.  3/30  INR 2.79 3/21 INR 3.13  Goal of Therapy:  INR 2.5-3.5 Monitor platelets  by anticoagulation protocol: Yes   Plan:  Continue current home regimen. F/u INR in am.  Ramond Dial, Pharm.D Clinical Pharmacist 09/29/2015,7:54 AM

## 2015-09-29 NOTE — Progress Notes (Signed)
Initial Heart Failure Clinic appointment scheduled for October 17, 2015 at 10:00am. Thank you.

## 2015-09-29 NOTE — Consult Note (Signed)
Cardiology Consult    Patient ID: [name] MRN: [id number], DOB/AGE: Sep 29, 1934   Admit date: 09/28/2015 Date of Consult: 09/29/2015  Primary Physician: Rusty Aus, MD Reason for Consult: CHF Primary Cardiologist: Dr. Mina Marble - [facility] Requesting Provider: Dr. Margaretmary Eddy   History of Present Illness    [name] is a 81 y.o. female with past medical history of chronic combined systolic and diastolic CHF (EF 123456 by echo in 08/2015), s/p mechanical MVR (1994, redo MVR in 08/2014), chronic atrial fibrillation (on Coumadin), HLD, and Stage 3 CKD who presented to [facility] on 09/28/2015 from her PCP's office for worsening lower extremity edema and shortness of breath.   The patient reports her normal weight is approximately 160 pounds, but was up to 189 lbs when seen by her PCP. She had been taking her Torsemide 40mg  daily without any improvement in her symptoms. Denies any chest discomfort or palpitations.  Labs this admission show a BNP of 559. Troponin 0.03. WBC 4.9. Hgb 12.1. Platelets 150. K+ 5.2. Creatinine 2.29. CXR showing cardiomegaly and pulmonary vascular congestion without overt edema. She was started on Lasix 40mg  BID with a net output of -1.3L thus far.  She was recently admitted at [facility] from 08/31/2015 - 09/06/2015 for similar symptoms. Her echo from 08/2015 showed an EF of 25%, reduced from 45% in 08/2014. She was started on IV diuresis but her lower extremity edema did not significantly improve. A RHC was performed and showed "normal filling pressures and cardiac output suggesting her symptoms of volume overload were likely related to impaired venous drainage" per report in Care Everywhere.  At the time of discharge, she still had 2+ edema and wound dressings were in place. Her weight was 179 lbs at that time.  She followed up with her Primary Cardiologist last week and her Torsemide was switched from 40mg  BID to 40mg  daily with her elevated creatinine. Weight was 178  lbs at that time. Again, her lower extremity edema was felt to be secondary to lymphedema and she was encouraged to continue to follow with [facility].   Past Medical History   Past Medical History  Diagnosis Date  . Shortness of breath dyspnea   . Hypertension   . CHF (congestive heart failure) ([place])   . GERD (gastroesophageal reflux disease)   . Arthritis     Past Surgical History  Procedure Laterality Date  . Cardiac catheterization    . Pyloric stenosis    . Cataract extraction w/phaco Right 02/20/2015    Procedure: CATARACT EXTRACTION PHACO AND INTRAOCULAR LENS PLACEMENT (IOC);  Surgeon: Estill Cotta, MD;  Location: [facility] ORS;  Service: Ophthalmology;  Laterality: Right;  US01:31.2 AP 25.3% CDE40.13 Fluid lot # [id number]  . Breast surgery Right 2005?    benign lump excision  . Cardiac valve replacement  1994, 2016  . Irrigation and debridement hematoma Left 07/05/2015    Procedure: IRRIGATION AND DEBRIDEMENT HEMATOMA;  Surgeon: Robert Bellow, MD;  Location: [facility] ORS;  Service: General;  Laterality: Left;  . US echocardiography       Allergies  Allergies  Allergen Reactions  . Ace Inhibitors     Other reaction(s): Cough  . Hydrocodone Itching and Nausea Only  . Mercury     Other reaction(s): Unknown  . Nickel   . Silver Dermatitis    Severe itching  . Cephalexin Rash  . Penicillins Rash    Inpatient Medications    . aspirin EC  81 mg Oral Daily  . furosemide  40 mg Intravenous BID  . metoprolol succinate  100 mg Oral Daily  . multivitamin with minerals  1 tablet Oral Daily  . pantoprazole  40 mg Oral Daily  . sodium polystyrene  30 g Oral Once  . [START ON 09/30/2015] warfarin  5 mg Oral Once per day on Sun Mon Tue Thu Sat  . warfarin  7.5 mg Oral Once per day on Wed Fri  . Warfarin - Pharmacist Dosing Inpatient   Does not apply q1800    Family History    Family History  Problem Relation Age of Onset  . Stroke Mother   .  Renal Disease Father     Social History    Social History   Social History  . Marital Status: Widowed    Spouse Name: N/A  . Number of Children: N/A  . Years of Education: N/A   Occupational History  . Not on file.   Social History Main Topics  . Smoking status: Former Research scientist (life sciences)  . Smokeless tobacco: Never Used  . Alcohol Use: No     Comment: occasional  . Drug Use: No  . Sexual Activity: Not on file   Other Topics Concern  . Not on file   Social History Narrative     Review of Systems    General:  No chills, fever, night sweats or weight changes.  Cardiovascular:  No chest pain, orthopnea, palpitations, paroxysmal nocturnal dyspnea. Positive for dyspnea on exertion and edema. Dermatological: No rash, lesions/masses Respiratory: No cough, dyspnea Urologic: No hematuria, dysuria Abdominal:   No nausea, vomiting, diarrhea, bright red blood per rectum, melena, or hematemesis Neurologic:  No visual changes, wkns, changes in mental status. All other systems reviewed and are otherwise negative except as noted above.  Physical Exam    Blood pressure 110/56, pulse 65, temperature 97.8 F (36.6 C), temperature source Oral, resp. rate 16, height 5' 8.5" (1.74 m), weight 184 lb (83.462 kg), SpO2 96 %.  General: Pleasant, Caucasian female appearing in NAD. Psych: Normal affect. Neuro: Alert and oriented X 3. Moves all extremities spontaneously. HEENT: Normal  Neck: Supple without bruits or JVD. Lungs:  Resp regular and unlabored, CTA without wheezing or rales. Heart: Irregularly irregular, no s3, s4, 2/6 SEM at RUSB. Crisp valve sounds present. Abdomen: Soft, non-tender, non-distended, BS + x 4.  Extremities: No clubbing or cyanosis. 2+ edema up to thighs bilaterally. DP/PT/Radials 1+ and equal bilaterally. Unna boots in place.  Labs    Troponin (Point of Care Test) No results for input(s): TROPIPOC in the last 72 hours.  Recent Labs  09/28/15 1233  TROPONINI <0.03    Lab Results  Component Value Date   WBC 4.9 09/28/2015   HGB 12.1 09/28/2015   HCT 37.2 09/28/2015   MCV 81.5 09/28/2015   PLT 150 09/28/2015    Recent Labs Lab 09/28/15 1233  NA 133*  K 5.2*  CL 101  CO2 23  BUN 84*  CREATININE 2.29*  CALCIUM 9.5  GLUCOSE 94   No results found for: CHOL, HDL, LDLCALC, TRIG No results found for: [facility]   Radiology Studies    Dg Chest 2 View: 09/28/2015  CLINICAL DATA:  81 year old female with increased lower extremity swelling, shortness of breath for 1 day. Initial encounter. EXAM: CHEST  2 VIEW COMPARISON:  Chest radiographs 03/24/2012. FINDINGS: Larger lung volumes with resolved small pleural effusions present in 2013. Chronic cardiomegaly appears stable. Sequelae of cardiac valve  replacement and median sternotomy. Other mediastinal contours are within normal limits. Visualized tracheal air column is within normal limits. No pneumothorax. Pulmonary vascular congestion without overt edema. No confluent pulmonary opacity. Osteopenia with multilevel chronic appearing thoracic compression fractures. No acute osseous abnormality identified. IMPRESSION: Cardiomegaly and pulmonary vascular congestion without overt edema. Electronically Signed   By: Genevie Ann M.D.   On: 09/28/2015 13:13    EKG & Cardiac Imaging    EKG: Atrial fibrillation, HR 79; Left Axis Deviation with LVH present.  Cardiac Catheterization: 09/01/2015 RHC 3/3 - "Right Heart Catheterization State: Baseline RA: 11 mmHg (mean) RV: 52/ 12 mmHg PA: 52/ 20 30 mmHg (mean) PCW: 18 mmHg (mean) AV O2: 4.2 vol% Cardiac output: 5.3 L/min Cardiac index: 2.7 L/min-m2 PVR: 2.3 Wood units Shunt Ratio: 1.0 (Qp/Qs) L to R shunt: 0.0 L/min (Qp-Qep) R to L shunt: 0.0 L/min (Qs-Qep)  Impressions Mild elevation of filling pressures, normal CI Normal mechanical MVR mobility No gradient from low IVC to RA Hemodynamics not c/w constrictive pericarditis Mild Pulmonary  hypertension Recommendations Cont medical evaluation and treatment"   Echocardiogram: 08/17/2015 LEFT VENTRICLE Anterior: HYPOCONTRACTILE Size: Normal Lateral: HYPOCONTRACTILE Contraction: SEVERE GLOBAL DECREASE Septal: HYPOCONTRACTILE Closest EF: 25% (Estimated) Apical: HYPOCONTRACTILE LV masses: No Masses Inferior: AKINETIC LVH: MODERATE LVH CONCENTRIC Posterior: HYPOCONTRACTILE Dias.FxClass: can't be determined  MITRAL VALVE Leaflets: ABNORMAL Mobility: Fully mobile Morphology: MECH PROSTHETIC MV Note: 62mm ST JUDE MECHANICAL  LEFT ATRIUM Size: MILDLY ENLARGED LA masses: No masses Normal IAS  SEVERE LV DYSFUNCTION (See above) WITH MODERATE LVH MODERATE RV SYSTOLIC DYSFUNCTION (See above) VALVULAR REGURGITATION: TRIVIAL AR, TRIVIAL PR, MILD TR PROSTHETIC VALVE(S): MECH PROSTHETIC MV S/P MV REPLACEMENT ACCURACY OF ASSESSMENT OF LV AND RV FUNCTION LIMITED IN SETTING OF AFIB WITH RVR  Assessment & Plan    1. Acute on Chronic Combined Systolic and Diastolic CHF - EF 123456 by echo in 08/2015. RHC performed in 08/2015 which showed "normal filling pressures and cardiac output suggesting her symptoms of volume overload were likely related to impaired venous drainage". - reports a baseline weight of 160 pounds, but was 179 lbs at the time of her recent hospital discharge on 09/06/2015. Elevated to 189 lbs when seen by her PCP on 09/28/2015. - BNP elevated to 559. CXR showing cardiomegaly and pulmonary vascular congestion without overt edema.  - started on Lasix 40mg  BID with a net output of -1.3L thus far. Was on Torsemide 40mg  daily (recently decreased from 40mg  BID by her Primary Cardiologist due to AKI).  - while hospitalized at [facility], she was diuresed but still had significant edema at the time of discharge. A RHC showed normal filling pressures and output, therefore her symptoms of volume overload were thought to be related to impaired venous drainage per report in Care Everywhere.   -  continue BB. ARB on hold in setting of AKI. Would avoid over diuresis, as her lower extremity edema is likely secondary to lymphedema or venous insufficiency. Would benefit from [facility] (Dr. Delana Meyer) consulting on the patient.  2. Lower extremity swelling - recent cath showed normal filling pressures and cardiac output.  - followed by Dr.Schnier with [facility]. Unna boots are in place and the patient reports they were talking about compression pumps as the next step in her treatment course for her likely lymphedema.  3. Chronic Atrial Fibrillation This patients CHA2DS2-VASc Score and unadjusted Ischemic Stroke Rate (% per year) is equal to 7.2 % stroke rate/year from a score of  5(CHF, HTN, Female, Age (2)). Continue Coumadin for anticoagulation. - continue BB for rate-control.  4. S/p MVR - mechanical valve replacement in 1994, redo MVR in 08/2014 - continue Coumadin  5. Stage 3 CKD  - creatinine 1.7 three weeks ago. - elevated to 2.29 on 09/28/2015. Continue to monitor.  - Nephrology following.   Signed, Erma Heritage, PA-C 09/29/2015, 10:45 AM Pager: [phone]

## 2015-09-29 NOTE — Progress Notes (Signed)
Patient has refused Echo, patient stated she had an echo at [facility] in February.

## 2015-09-29 NOTE — Consult Note (Signed)
[facility]  CONSULT NOTE    Date: 09/29/2015                  Patient Name:  [name]  MRN: [id number]  DOB: 03/14/1935  Age / Sex: 81 y.o., female         PCP: Rusty Aus, MD                 Service Requesting Consult: Dr. Margaretmary Eddy                 Reason for Consult: Acute renal Failure on chronic kidney disease stage III            History of Present Illness: [name] is a 81 y.o. white female with hypertension, systolic and diastolic congestive heart failure, mitral valve replacement 09/05/15, hyperlipidemia, atrial fibrillation, anemia, who was admitted to [facility] on 09/28/2015 for Acute on chronic congestive heart failure, unspecified congestive heart failure type ([place]) [I50.9]   Patient was discharged from [facility] earlier this month, she was then sent to her PCP where she was found to have acute exacerbation of CHF. She was then admitted to [facility]. She feels that torsemide is not working as well as furosemide.   Patient with creatinine of 2.1 with eGFR of 23 on 3/21.    Medications: Outpatient medications: Prescriptions prior to admission  Medication Sig Dispense Refill Last Dose  . acetaminophen (TYLENOL) 500 MG tablet Take 500 mg by mouth every 6 (six) hours as needed for mild pain, moderate pain, fever or headache.   prn at prn  . aspirin EC 81 MG tablet Take 81 mg by mouth daily.    09/28/2015 at 0800  . losartan (COZAAR) 25 MG tablet TAKE 1 TABLET (25 MG TOTAL) BY MOUTH ONCE DAILY.  11 09/28/2015 at 0800  . metoprolol succinate (TOPROL-XL) 100 MG 24 hr tablet Take 100 mg by mouth daily. Take with or immediately following a meal.   09/28/2015 at 0800  . Multiple Vitamin (MULTI-VITAMINS) TABS Take 1 tablet by mouth daily.    09/28/2015 at 0800  . pantoprazole (PROTONIX) 40 MG tablet Take 40 mg by mouth daily.    09/28/2015 at 0800  . spironolactone (ALDACTONE) 25 MG tablet Take 1 tablet by mouth 2 (two) times daily.    09/28/2015 at 0800   . torsemide (DEMADEX) 20 MG tablet TAKE 2 TABLETS (40 MG TOTAL) BY MOUTH 2 times  DAILY.  11 09/28/2015 at 0800  . traMADol (ULTRAM) 50 MG tablet Take by mouth every 6 (six) hours as needed.   prn at prn   . vitamin C (ASCORBIC ACID) 500 MG tablet Take 500 mg by mouth daily as needed (for cold symptoms).    09/28/2015 at Unknown time  . warfarin (COUMADIN) 5 MG tablet Take 5 mg by mouth daily. Take 59m daily except on Wednesday and Friday (then take 7.558m   09/27/2015 at Unknown time  . warfarin (COUMADIN) 7.5 MG tablet Take 7.5 mg by mouth as directed. Wed fri   09/27/2015 at Unknown time    Current medications: Current Facility-Administered Medications  Medication Dose Route Frequency Provider Last Rate Last Dose  . acetaminophen (TYLENOL) tablet 500 mg  500 mg Oral Q6H PRN ArNicholes MangoMD   500 mg at 09/29/15 0243  . aspirin EC tablet 81 mg  81 mg Oral Daily ArNicholes MangoMD   81 mg at 09/29/15 0823  . furosemide (LASIX) injection 40  mg  40 mg Intravenous BID Nicholes Mango, MD   40 mg at 09/29/15 0824  . metoprolol succinate (TOPROL-XL) 24 hr tablet 100 mg  100 mg Oral Daily Nicholes Mango, MD   100 mg at 09/29/15 0824  . multivitamin with minerals tablet 1 tablet  1 tablet Oral Daily Nicholes Mango, MD   1 tablet at 09/29/15 0823  . pantoprazole (PROTONIX) EC tablet 40 mg  40 mg Oral Daily Nicholes Mango, MD   40 mg at 09/28/15 2106  . sodium polystyrene (KAYEXALATE) 15 GM/60ML suspension 30 g  30 g Oral Once Nicholes Mango, MD   30 g at 09/28/15 1637  . traMADol (ULTRAM) tablet 50 mg  50 mg Oral Q6H PRN Nicholes Mango, MD   50 mg at 09/29/15 1036  . vitamin C (ASCORBIC ACID) tablet 500 mg  500 mg Oral Daily PRN Nicholes Mango, MD      . Derrill Memo ON 09/30/2015] warfarin (COUMADIN) tablet 5 mg  5 mg Oral Once per day on Sun Mon Tue Thu Sat Nicholes Mango, MD      . warfarin (COUMADIN) tablet 7.5 mg  7.5 mg Oral Once per day on Wed Fri Nicholes Mango, MD      . Warfarin - Pharmacist Dosing Inpatient   Does not apply [id number]  Nicholes Mango, MD          Allergies: Allergies  Allergen Reactions  . Ace Inhibitors     Other reaction(s): Cough  . Hydrocodone Itching and Nausea Only  . Mercury     Other reaction(s): Unknown  . Nickel   . Silver Dermatitis    Severe itching  . Cephalexin Rash  . Penicillins Rash      Past Medical History: Past Medical History  Diagnosis Date  . Shortness of breath dyspnea   . Hypertension   . CHF (congestive heart failure) ([place])   . GERD (gastroesophageal reflux disease)   . Arthritis      Past Surgical History: Past Surgical History  Procedure Laterality Date  . Cardiac catheterization    . Pyloric stenosis    . Cataract extraction w/phaco Right 02/20/2015    Procedure: CATARACT EXTRACTION PHACO AND INTRAOCULAR LENS PLACEMENT (IOC);  Surgeon: Estill Cotta, MD;  Location: [facility] ORS;  Service: Ophthalmology;  Laterality: Right;  US01:31.2 AP 25.3% CDE40.13 Fluid lot # [id number]  . Breast surgery Right 2005?    benign lump excision  . Cardiac valve replacement  1994, 2016  . Irrigation and debridement hematoma Left 07/05/2015    Procedure: IRRIGATION AND DEBRIDEMENT HEMATOMA;  Surgeon: Robert Bellow, MD;  Location: [facility] ORS;  Service: General;  Laterality: Left;  . US echocardiography       Family History: Family History  Problem Relation Age of Onset  . Stroke Mother   . Renal Disease Father      Social History: Social History   Social History  . Marital Status: Widowed    Spouse Name: N/A  . Number of Children: N/A  . Years of Education: N/A   Occupational History  . Not on file.   Social History Main Topics  . Smoking status: Former Research scientist (life sciences)  . Smokeless tobacco: Never Used  . Alcohol Use: No     Comment: occasional  . Drug Use: No  . Sexual Activity: Not on file   Other Topics Concern  . Not on file   Social History Narrative     Review of Systems: Review of Systems  Constitutional: Negative.  Negative for fever, chills,  weight loss, malaise/fatigue and diaphoresis.  HENT: Negative.  Negative for congestion, ear discharge, ear pain, hearing loss, nosebleeds, sore throat and tinnitus.   Eyes: Negative.  Negative for blurred vision, double vision, photophobia, pain, discharge and redness.  Respiratory: Positive for cough, hemoptysis, sputum production, shortness of breath and wheezing. Negative for stridor.   Cardiovascular: Positive for orthopnea, leg swelling and PND. Negative for chest pain, palpitations and claudication.  Gastrointestinal: Negative.  Negative for heartburn, nausea, vomiting, abdominal pain, diarrhea, constipation, blood in stool and melena.  Genitourinary: Negative.  Negative for dysuria, urgency, frequency, hematuria and flank pain.  Musculoskeletal: Negative.  Negative for myalgias, back pain, joint pain, falls and neck pain.  Skin: Negative.  Negative for itching and rash.  Neurological: Negative for dizziness, tingling, tremors, sensory change, speech change, focal weakness, seizures, loss of consciousness, weakness and headaches.  Endo/Heme/Allergies: Negative for environmental allergies and polydipsia. Does not bruise/bleed easily.  Psychiatric/Behavioral: Negative.  Negative for depression, suicidal ideas, hallucinations, memory loss and substance abuse. The patient is not nervous/anxious and does not have insomnia.     Vital Signs: Blood pressure 110/56, pulse 65, temperature 97.8 F (36.6 C), temperature source Oral, resp. rate 16, height 5' 8.5" (1.74 m), weight 83.462 kg (184 lb), SpO2 96 %.  Weight trends: Filed Weights   09/28/15 1228 09/28/15 2001  Weight: 85.73 kg (189 lb) 83.462 kg (184 lb)    Physical Exam: General: NAD, sitting in chair  Head: Normocephalic, atraumatic. Moist oral mucosal membranes  Eyes: Anicteric, PERRL  Neck: Supple, trachea midline  Lungs:  Bibasilar rales  Heart: Regular rate and rhythm  Abdomen:  Soft, nontender,   Extremities:  + peripheral  edema.  Neurologic: Nonfocal, moving all four extremities  Skin: No lesions        Lab results: Basic Metabolic Panel:  Recent Labs Lab 09/28/15 1233  NA 133*  K 5.2*  CL 101  CO2 23  GLUCOSE 94  BUN 84*  CREATININE 2.29*  CALCIUM 9.5    Liver Function Tests: No results for input(s): AST, ALT, ALKPHOS, BILITOT, PROT, ALBUMIN in the last 168 hours. No results for input(s): LIPASE, AMYLASE in the last 168 hours. No results for input(s): AMMONIA in the last 168 hours.  CBC:  Recent Labs Lab 09/28/15 1233  WBC 4.9  HGB 12.1  HCT 37.2  MCV 81.5  PLT 150    Cardiac Enzymes:  Recent Labs Lab 09/28/15 1233  TROPONINI <0.03    BNP: Invalid input(s): POCBNP  CBG: No results for input(s): GLUCAP in the last 168 hours.  Microbiology: Results for orders placed or performed in visit on 03/22/12  Urine culture     Status: None   Collection Time: 03/22/12 11:51 AM  Result Value Ref Range Status   Micro Text Report   Final       SOURCE: CC    COMMENT                   MIXED BACTERIAL ORGANISMS   COMMENT                   RESULTS SUGGESTIVE OF CONTAMINATION   ANTIBIOTIC  Culture, blood (single)     Status: None   Collection Time: 03/22/12 12:03 PM  Result Value Ref Range Status   Micro Text Report   Final       COMMENT                   NO GROWTH AEROBICALLY/ANAEROBICALLY IN 5 DAYS   ANTIBIOTIC                                                      Culture, blood (single)     Status: None   Collection Time: 03/22/12 12:08 PM  Result Value Ref Range Status   Micro Text Report   Final       COMMENT                   NO GROWTH AEROBICALLY/ANAEROBICALLY IN 5 DAYS   ANTIBIOTIC                                                      Culture, blood (single)     Status: None   Collection Time: 03/24/12 11:09 AM  Result Value Ref Range Status   Micro Text Report   Final       COMMENT                   NO  GROWTH AEROBICALLY/ANAEROBICALLY IN 5 DAYS   ANTIBIOTIC                                                        Coagulation Studies:  Recent Labs  09/28/15 1233 09/29/15 0442  LABPROT 29.0* 31.6*  INR 2.79 3.13    Urinalysis: No results for input(s): COLORURINE, LABSPEC, PHURINE, GLUCOSEU, HGBUR, BILIRUBINUR, KETONESUR, PROTEINUR, UROBILINOGEN, NITRITE, LEUKOCYTESUR in the last 72 hours.  Invalid input(s): APPERANCEUR    Imaging: Dg Chest 2 View  09/28/2015  CLINICAL DATA:  81 year old female with increased lower extremity swelling, shortness of breath for 1 day. Initial encounter. EXAM: CHEST  2 VIEW COMPARISON:  Chest radiographs 03/24/2012. FINDINGS: Larger lung volumes with resolved small pleural effusions present in 2013. Chronic cardiomegaly appears stable. Sequelae of cardiac valve replacement and median sternotomy. Other mediastinal contours are within normal limits. Visualized tracheal air column is within normal limits. No pneumothorax. Pulmonary vascular congestion without overt edema. No confluent pulmonary opacity. Osteopenia with multilevel chronic appearing thoracic compression fractures. No acute osseous abnormality identified. IMPRESSION: Cardiomegaly and pulmonary vascular congestion without overt edema. Electronically Signed   By: Genevie Ann M.D.   On: 09/28/2015 13:13      Assessment & Plan: [name] is a 81 y.o. white female with hypertension, systolic and diastolic congestive heart failure, mitral valve replacement 09/05/15, hyperlipidemia, atrial fibrillation, anemia, who was admitted to [facility] on 09/28/2015 for Acute on chronic congestive heart failure, unspecified congestive heart failure type ([place]) [I50.9]   1. Acute Renal Failure with hyperkalemia on chronic kidney disease stage IV: baseline creatinine of 1.09 eGFR  of 48 04/30/15. Acute renal failure with creatinine ranging 1.8-2.2 since admission to [facility]. Progression of disease versus acute renal  failure from mitral valve replacement. Now with acute cardiorenal syndrome - Agree with IV furosemide. More likely to change furosemide from torsemide outpatient.  - hold spironolactone due to renal insufficiency and hyperkalemia - hold losartan.  2. Hypertension: with acute exacerbation of systolic and diastolic congestive heart failure with new mitral valve replacement - blood pressure at goal - IV furosemide   LOS: 1 Erin Uecker 3/31/201710:51 AM

## 2015-09-29 NOTE — Progress Notes (Signed)
A&O. Admitted from home with 26 pound weight gain and swelling. IV lasix given in ER. Afib on tele. Tylenol given for pain. Up with one assist.

## 2015-09-29 NOTE — Care Management (Signed)
Met with daughter [name] at bedside. She states she wanted to clarify a few things about her mom.  She lives with patient but works in [place] and travels. Her mother has to climb stairs in the home and can barely get down them due to the edema and shortness of breath. She states patient claims she drives but only is able to go to [facility] and to get her hair done. She is concerned that patient needs STR and is not ready for discharge due to the severe lower extremity edema.  Requested Dr. Anselm Jungling order a PT evaluation. Provided Cm name and weekend CM name. Will follow up.

## 2015-09-29 NOTE — Consult Note (Signed)
WOC wound consult note Reason for Consult: Unnas boots and painful left leg.  Will remove compression and assess.  Wound type:Venous insufficiency, with pulmonary edema.  Weekly Unnas boots on Wednesday. Pain to left leg.  Pressure Ulcer POA: N/A Measurement:Serum filled blister to left medial lower leg  4 cm x 4 cm leaking serum filled blister.  Scabbed lesions to left dorsal foot and left dorsal lower leg, patient indicates they are chronic but the blister is new.  Very painful.  Wound [id number] filled blister Drainage (amount, consistency, odor) Moderate serous weeping to left leg.  Periwound:Erythema and generalized, pitting edema. Patient is allergic to silver, so will use calcium alginate dressing for absorption and reapply Unna's boots Dressing procedure/placement/frequency:Cleanse left leg with soap and water and pat dry.  Apply calcium alginate to left leg blistering for absorption.  Cover with zinc layer and secure with Coban.  Wrap from below toes to just below knee. Change weekly.  Will not follow at this time.  Please re-consult if needed.  Domenic Moras RN BSN [place] Pager [phone]

## 2015-09-29 NOTE — Progress Notes (Signed)
[place] [facility] [place] NAME: [name]    MR#:  [id number]  DATE OF BIRTH:  01-10-1935  SUBJECTIVE:  CHIEF COMPLAINT:   Chief Complaint  Patient presents with  . Shortness of Breath  . Leg Swelling   Came with severe leg swelling and weight gain, was admitted to [facility] last month- but did not have much diuresis there. Sent in by her PMD. Have good response to IV lasix.  REVIEW OF SYSTEMS:  CONSTITUTIONAL: No fever, fatigue or weakness.  EYES: No blurred or double vision.  EARS, NOSE, AND THROAT: No tinnitus or ear pain.  RESPIRATORY: No cough, shortness of breath, wheezing or hemoptysis.  CARDIOVASCULAR: No chest pain, orthopnea,positive for severe edema.  GASTROINTESTINAL: No nausea, vomiting, diarrhea or abdominal pain.  GENITOURINARY: No dysuria, hematuria.  ENDOCRINE: No polyuria, nocturia,  HEMATOLOGY: No anemia, easy bruising or bleeding SKIN: No rash or lesion. MUSCULOSKELETAL: No joint pain or arthritis.   NEUROLOGIC: No tingling, numbness, weakness.  PSYCHIATRY: No anxiety or depression.   ROS  DRUG ALLERGIES:   Allergies  Allergen Reactions  . Ace Inhibitors     Other reaction(s): Cough  . Hydrocodone Itching and Nausea Only  . Mercury     Other reaction(s): Unknown  . Nickel   . Silver Dermatitis    Severe itching  . Cephalexin Rash  . Penicillins Rash    VITALS:  Blood pressure 110/56, pulse 65, temperature 97.8 F (36.6 C), temperature source Oral, resp. rate 16, height 5' 8.5" (1.74 m), weight 83.462 kg (184 lb), SpO2 96 %.  PHYSICAL EXAMINATION:  GENERAL:  81 y.o.-year-old patient lying in the bed with no acute distress.  EYES: Pupils equal, round, reactive to light and accommodation. No scleral icterus. Extraocular muscles intact.  HEENT: Head atraumatic, normocephalic. Oropharynx and nasopharynx clear.  NECK:  Supple, no jugular venous distention. No thyroid enlargement, no tenderness.   LUNGS: Normal breath sounds bilaterally, no wheezing, rales,rhonchi or crepitation. No use of accessory muscles of respiration.  CARDIOVASCULAR: S1, S2 normal. Positive for murmurs, severe b/l leg and thigh edema ABDOMEN: Soft, nontender, nondistended. Bowel sounds present. No organomegaly or mass.  EXTREMITIES: severe pedal edema, no cyanosis, or clubbing.  NEUROLOGIC: Cranial nerves II through XII are intact. Muscle strength 5/5 in all extremities. Sensation intact. Gait not checked.  PSYCHIATRIC: The patient is alert and oriented x 3.  SKIN: No obvious rash, lesion, or ulcer.   Physical Exam LABORATORY PANEL:   CBC  Recent Labs Lab 09/28/15 1233  WBC 4.9  HGB 12.1  HCT 37.2  PLT 150   ------------------------------------------------------------------------------------------------------------------  Chemistries   Recent Labs Lab 09/28/15 1233 09/29/15 1446  NA 133*  --   K 5.2* 4.3  CL 101  --   CO2 23  --   GLUCOSE 94  --   BUN 84*  --   CREATININE 2.29*  --   CALCIUM 9.5  --    ------------------------------------------------------------------------------------------------------------------  Cardiac Enzymes  Recent Labs Lab 09/28/15 1233  TROPONINI <0.03   ------------------------------------------------------------------------------------------------------------------  RADIOLOGY:  Dg Chest 2 View  09/28/2015  CLINICAL DATA:  81 year old female with increased lower extremity swelling, shortness of breath for 1 day. Initial encounter. EXAM: CHEST  2 VIEW COMPARISON:  Chest radiographs 03/24/2012. FINDINGS: Larger lung volumes with resolved small pleural effusions present in 2013. Chronic cardiomegaly appears stable. Sequelae of cardiac valve replacement and median sternotomy. Other mediastinal contours are within normal limits. Visualized tracheal air column  is within normal limits. No pneumothorax. Pulmonary vascular congestion without overt edema. No  confluent pulmonary opacity. Osteopenia with multilevel chronic appearing thoracic compression fractures. No acute osseous abnormality identified. IMPRESSION: Cardiomegaly and pulmonary vascular congestion without overt edema. Electronically Signed   By: Genevie Ann M.D.   On: 09/28/2015 13:13    ASSESSMENT AND PLAN:   * Ac on ch systolic CHF   IV lasix, I/o monitor, Fluid restriction, Cardio consult.  * Ac renal failure   Likely due to cardiac failure- and decreased circulation.   IV lasix, monitor.   Nephrology on case.  * Hyperkalemia   Now normal after lasix.  * Hypertension   Metoprolol, lasix  * s//p valvular replacement   On coumadin, INR therapeutic, continue.   All the records are reviewed and case discussed with Care Management/Social Workerr. Management plans discussed with the patient, family and they are in agreement.  CODE STATUS: Full.  TOTAL TIME TAKING CARE OF THIS PATIENT: 35 minutes.   Daughter present in room during my visit.  POSSIBLE D/C IN 2-3 DAYS, DEPENDING ON CLINICAL CONDITION.   [name] M.D on 09/29/2015   Between 7am to 6pm - Pager - [phone]  After 6pm go to [email or web address] - password [email or web address] [facility]  Office  [phone]  CC: Primary care physician; Rusty Aus, MD  Note: This dictation was prepared with Dragon dictation along with smaller phrase technology. Any transcriptional errors that result from this process are unintentional.

## 2015-09-29 NOTE — Care Management Note (Signed)
Case Management Note  Patient Details  Name: JASKIRAT ZERTUCHE MRN: [id number] Date of Birth: September 19, 1934  Subjective/Objective:   CM consult for discharge planning. Met with patient to discuss discharge. Pateint states she lives at home with her daughter. She reports she is independent, active and still drives.  PCP is Dr. Sabra Heck. Denies issues obtaining medications, copays or  Medical care. Staff reports patient has been up walking in the room. No needs anticipated. Case closed              Action/Plan: No needs identified.   Expected Discharge Date:                  Expected Discharge Plan:  Home/Self Care  In-House Referral:     Discharge [place] not met per provider  Post Acute Care Choice:    Choice offered to:     DME Arranged:    DME Agency:     HH Arranged:    HH Agency:     Status of Service:  Completed, signed off  Medicare Important Message Given:  Yes Date Medicare IM Given:    Medicare IM give by:    Date Additional Medicare IM Given:    Additional Medicare Important Message give by:     If discussed at [place] of Stay Meetings, dates discussed:    Additional Comments:  Jolly Mango, RN 09/29/2015, 1:42 PM

## 2015-09-29 NOTE — Telephone Encounter (Signed)
Pt daughter called, states she spoke w Dr.Gollan this morning regarding her mom, and wanted to leave her cell #. She states Dr. Darnell Level told he he would either call or come by to see pt. Please call.

## 2015-09-29 NOTE — Care Management (Signed)
Was informed that patient's son needs to speak with a Education officer, museum.  Son- [name] and his sister [name] feel as though they are receiving conflicting information regarding patient's medical condition.  Says they were told patient had heart failure and given lasix- then another physician says it is not fluid- that patient is too dry so it was stopped and patient on a fluid restriction and informed fluid issue in lower extremities can be taken care of by compression .  Asks "where is the compression?  Patient had a recent inpatient stay at [facility] and feels that everything they are being told by physicians are conflicting within themselves and different from what they were told at [facility]  [name] says that "it feels like we are being taken for a ride."   Attending is being contacted to speak with family.

## 2015-09-30 DIAGNOSIS — I5041 Acute combined systolic (congestive) and diastolic (congestive) heart failure: Secondary | ICD-10-CM

## 2015-09-30 DIAGNOSIS — I4891 Unspecified atrial fibrillation: Secondary | ICD-10-CM

## 2015-09-30 DIAGNOSIS — N179 Acute kidney failure, unspecified: Secondary | ICD-10-CM

## 2015-09-30 DIAGNOSIS — Z7901 Long term (current) use of anticoagulants: Secondary | ICD-10-CM

## 2015-09-30 LAB — BASIC METABOLIC PANEL
ANION GAP: 7 (ref 5–15)
BUN: 75 mg/dL — ABNORMAL HIGH (ref 6–20)
CHLORIDE: 101 mmol/L (ref 101–111)
CO2: 28 mmol/L (ref 22–32)
Calcium: 9.8 mg/dL (ref 8.9–10.3)
Creatinine, Ser: 1.89 mg/dL — ABNORMAL HIGH (ref 0.44–1.00)
GFR calc non Af Amer: 24 mL/min — ABNORMAL LOW (ref >60)
GFR, EST AFRICAN AMERICAN: 28 mL/min — AB (ref >60)
Glucose, Bld: 109 mg/dL — ABNORMAL HIGH (ref 65–99)
Potassium: 4.5 mmol/L (ref 3.5–5.1)
Sodium: 136 mmol/L (ref 135–145)

## 2015-09-30 LAB — PROTIME-INR
INR: 3.17
Prothrombin Time: 31.9 seconds — ABNORMAL HIGH (ref 11.4–15.0)

## 2015-09-30 MED ORDER — METOPROLOL SUCCINATE ER 50 MG PO TB24
50.0000 mg | ORAL_TABLET | Freq: Every day | ORAL | Status: DC
  Administered 2015-10-01 – 2015-10-02 (×2): 50 mg via ORAL
  Filled 2015-09-30 (×6): qty 1

## 2015-09-30 NOTE — Progress Notes (Signed)
[facility]  ROUNDING NOTE   Subjective:   Sitting in chair. States her edema and breathing are better.  UOP recorded at 532m. - furosemide 488miv q12 Creatinine 1.89 (2.14)  Objective:  Vital signs in last 24 hours:  Temp:  [97.6 F (36.4 C)-97.7 F (36.5 C)] 97.6 F (36.4 C) (04/01 0858) Pulse Rate:  [80-128] 97 (04/01 0858) Resp:  [16-18] 18 (04/01 0858) BP: (105-107)/(66-77) 105/66 mmHg (04/01 0858) SpO2:  [97 %-100 %] 98 % (04/01 0858)  Weight change:  Filed Weights   09/28/15 1228 09/28/15 2001  Weight: 85.73 kg (189 lb) 83.462 kg (184 lb)    Intake/Output: I/O last 3 completed shifts: In: 480 [P.O.:480] Out: 750 [Urine:750]   Intake/Output this shift:     Physical Exam: General: NAD, sitting in chair  Head: Normocephalic, atraumatic. Moist oral mucosal membranes  Eyes: Anicteric, PERRL  Neck: Supple, trachea midline  Lungs:  Bilateral crackles  Heart: Regular rate and rhythm  Abdomen:  Soft, nontender,   Extremities:  +++ peripheral edema.  Neurologic: Nonfocal, moving all four extremities  Skin: No lesions       Basic Metabolic Panel:  Recent Labs Lab 09/28/15 1233 09/29/15 0442 09/29/15 1446 09/30/15 0552  NA 133*  --   --  136  K 5.2*  --  4.3 4.5  CL 101  --   --  101  CO2 23  --   --  28  GLUCOSE 94  --   --  109*  BUN 84*  --   --  75*  CREATININE 2.29* 2.14*  --  1.89*  CALCIUM 9.5  --   --  9.8    Liver Function Tests: No results for input(s): AST, ALT, ALKPHOS, BILITOT, PROT, ALBUMIN in the last 168 hours. No results for input(s): LIPASE, AMYLASE in the last 168 hours. No results for input(s): AMMONIA in the last 168 hours.  CBC:  Recent Labs Lab 09/28/15 1233  WBC 4.9  HGB 12.1  HCT 37.2  MCV 81.5  PLT 150    Cardiac Enzymes:  Recent Labs Lab 09/28/15 1233  TROPONINI <0.03    BNP: Invalid input(s): POCBNP  CBG: No results for input(s): GLUCAP in the last 168 hours.  Microbiology: Results  for orders placed or performed in visit on 03/22/12  Urine culture     Status: None   Collection Time: 03/22/12 11:51 AM  Result Value Ref Range Status   Micro Text Report   Final       SOURCE: CC    COMMENT                   MIXED BACTERIAL ORGANISMS   COMMENT                   RESULTS SUGGESTIVE OF CONTAMINATION   ANTIBIOTIC                                                      Culture, blood (single)     Status: None   Collection Time: 03/22/12 12:03 PM  Result Value Ref Range Status   Micro Text Report   Final       COMMENT                   NO GROWTH  AEROBICALLY/ANAEROBICALLY IN 5 DAYS   ANTIBIOTIC                                                      Culture, blood (single)     Status: None   Collection Time: 03/22/12 12:08 PM  Result Value Ref Range Status   Micro Text Report   Final       COMMENT                   NO GROWTH AEROBICALLY/ANAEROBICALLY IN 5 DAYS   ANTIBIOTIC                                                      Culture, blood (single)     Status: None   Collection Time: 03/24/12 11:09 AM  Result Value Ref Range Status   Micro Text Report   Final       COMMENT                   NO GROWTH AEROBICALLY/ANAEROBICALLY IN 5 DAYS   ANTIBIOTIC                                                        Coagulation Studies:  Recent Labs  09/28/15 1233 09/29/15 0442 09/30/15 0552  LABPROT 29.0* 31.6* 31.9*  INR 2.79 3.13 3.17    Urinalysis: No results for input(s): COLORURINE, LABSPEC, PHURINE, GLUCOSEU, HGBUR, BILIRUBINUR, KETONESUR, PROTEINUR, UROBILINOGEN, NITRITE, LEUKOCYTESUR in the last 72 hours.  Invalid input(s): APPERANCEUR    Imaging: Dg Chest 2 View  09/28/2015  CLINICAL DATA:  81 year old female with increased lower extremity swelling, shortness of breath for 1 day. Initial encounter. EXAM: CHEST  2 VIEW COMPARISON:  Chest radiographs 03/24/2012. FINDINGS: Larger lung volumes with resolved small pleural effusions present in 2013. Chronic  cardiomegaly appears stable. Sequelae of cardiac valve replacement and median sternotomy. Other mediastinal contours are within normal limits. Visualized tracheal air column is within normal limits. No pneumothorax. Pulmonary vascular congestion without overt edema. No confluent pulmonary opacity. Osteopenia with multilevel chronic appearing thoracic compression fractures. No acute osseous abnormality identified. IMPRESSION: Cardiomegaly and pulmonary vascular congestion without overt edema. Electronically Signed   By: Genevie Ann M.D.   On: 09/28/2015 13:13     Medications:     . aspirin EC  81 mg Oral Daily  . furosemide  40 mg Intravenous BID  . metoprolol succinate  100 mg Oral Daily  . multivitamin with minerals  1 tablet Oral Daily  . pantoprazole  40 mg Oral Daily  . sodium polystyrene  30 g Oral Once  . warfarin  5 mg Oral Once per day on Sun Mon Tue Thu Sat  . warfarin  7.5 mg Oral Once per day on Wed Fri  . Warfarin - Pharmacist Dosing Inpatient   Does not apply q1800   acetaminophen, traMADol, vitamin C  Assessment/ Plan:  [name] is a 81 y.o. white female with hypertension, systolic and  diastolic congestive heart failure, mitral valve replacement 09/05/15, hyperlipidemia, atrial fibrillation, anemia, who was admitted to [facility] on 09/28/2015 for Acute on chronic congestive heart failure, unspecified congestive heart failure type ([place]) [I50.9]   1. Acute Renal Failure with hyperkalemia on chronic kidney disease stage IV: baseline creatinine of 1.09 eGFR of 48 04/30/15 However with Acute renal failure with creatinine ranging 1.8-2.2 since admission to [facility]. Progression of disease versus acute renal failure from mitral valve replacement. Now with acute cardiorenal syndrome - Agree with IV furosemide. More likely to change furosemide from torsemide outpatient.  - hold spironolactone due to renal insufficiency and hyperkalemia. Status post kayexalate on admission - hold  losartan.  2. Hypertension: with acute exacerbation of systolic and diastolic congestive heart failure with new mitral valve replacement - blood pressure at goal - IV furosemide   LOS: Robbins, [name] 4/1/20179:18 AM

## 2015-09-30 NOTE — Evaluation (Signed)
Physical Therapy Evaluation Patient Details Name: [name] MRN: [id number] DOB: 1935-04-01 Today's Date: 09/30/2015   History of Present Illness  Patient is a 81 y/o female that presents with recent mitral valve replacement, admitted for worsening LE edema, weight gain, and acute on chronic CHF.   Clinical Impression  Patient has had decreased activity level it appears recently, though this may be progressive. She has significant LE edema bilaterally, in this session she is fairly independent with all mobility with no balance deficits identified throughout session. She is able to ascend/descend steps with rail assist, and ambulates household distances without complaints. She may benefit from an OT evaluation for lymphedema management. Otherwise she appears to be at her most recent baseline, and she denies any recent falls.     Follow Up Recommendations No PT follow up    Equipment Recommendations       Recommendations for Other Services OT consult (For lymphedema management)     Precautions / Restrictions Precautions Precautions: Fall Restrictions Weight Bearing Restrictions: No      Mobility  Bed Mobility               General bed mobility comments: Patient received in recliner chair.   Transfers Overall transfer level: Independent Equipment used: None             General transfer comment: No loss of balance identified with sit to stand transfer.   Ambulation/Gait Ambulation/Gait assistance: Supervision Ambulation Distance (Feet): 390 Feet Assistive device: None Gait Pattern/deviations: WFL(Within Functional Limits)   Gait velocity interpretation: Below normal speed for age/gender General Gait Details: No loss of balance noted throughout ambulation, patient reports she is at her baseline with gait.   Stairs Stairs: Yes Stairs assistance: Modified independent (Device/Increase time) Stair Management: One rail Right;Step to pattern Number of  Stairs: 13 General stair comments: Patient demonstrates no loss of balance or dyspnea with stair navigation.   Wheelchair Mobility    Modified Rankin (Stroke Patients Only)       Balance Overall balance assessment: No apparent balance deficits (not formally assessed)                                           Pertinent Vitals/Pain Pain Assessment:  (Patient does not mention or report any pain during this session. )    Home Living Family/patient expects to be discharged to:: Private residence Living Arrangements: Children Available Help at Discharge: Family Type of Home: House Home Access: Stairs to enter   Technical brewer of Steps: 2 Home Layout: Two level Home Equipment: Walker - 2 wheels      Prior Function Level of Independence: Independent         Comments: Patient reports being independent with mobility at baseline and not using AD, denies falls.      Hand Dominance        Extremity/Trunk Assessment   Upper Extremity Assessment: Overall WFL for tasks assessed           Lower Extremity Assessment: Overall WFL for tasks assessed (Significant bilateral edema noted, Unna boots in place)         Communication   Communication: No difficulties  Cognition Arousal/Alertness: Awake/alert Behavior During Therapy: WFL for tasks assessed/performed Overall Cognitive Status: Within Functional Limits for tasks assessed  General Comments General comments (skin integrity, edema, etc.): LE edema bilaterally with Unna boots in place.     Exercises        Assessment/Plan    PT Assessment Patient needs continued PT services  PT Diagnosis Difficulty walking   PT Problem List Decreased strength;Decreased mobility;Cardiopulmonary status limiting activity  PT Treatment Interventions Gait training;Stair training;Therapeutic activities;Therapeutic exercise;Balance training   PT Goals (Current goals can be  found in the Care Plan section) Acute Rehab PT Goals Patient Stated Goal: To return home once her swelling goes down.  PT Goal Formulation: With patient Time For Goal Achievement: 10/14/15 Potential to Achieve Goals: Good    Frequency Min 2X/week   Barriers to discharge        Co-evaluation               End of Session Equipment Utilized During Treatment: Gait belt Activity Tolerance: Patient tolerated treatment well Patient left: in chair;with call bell/phone within reach;with SCD's reapplied (No chair alarm on when PT entered room) Nurse Communication: Mobility status         Time: [id number] PT Time Calculation (min) (ACUTE ONLY): 17 min   Charges:   PT Evaluation $PT Eval Moderate Complexity: 1 Procedure     PT G Codes:       Kerman Passey, PT, DPT    09/30/2015, 4:45 PM

## 2015-09-30 NOTE — Progress Notes (Addendum)
Patient Name: [name] Date of Encounter: 09/30/2015  Hospital Problem List     Active Problems:   Atrial fibrillation with rapid ventricular response (HCC)   ARF (acute renal failure) (HCC)   Systolic and diastolic CHF, acute on chronic (HCC)   Chronic kidney disease (CKD)   Bilateral leg edema   Long term current use of anticoagulant therapy   S/P MVR (mitral valve replacement)   Lymphedema   Acute CHF (congestive heart failure) (HCC)    Subjective   Edema & breathing improved minimally   Feels better overall.  Inpatient Medications    . aspirin EC  81 mg Oral Daily  . furosemide  40 mg Intravenous BID  . metoprolol succinate  100 mg Oral Daily  . multivitamin with minerals  1 tablet Oral Daily  . pantoprazole  40 mg Oral Daily  . sodium polystyrene  30 g Oral Once  . warfarin  5 mg Oral Once per day on Sun Mon Tue Thu Sat  . warfarin  7.5 mg Oral Once per day on Wed Fri  . Warfarin - Pharmacist Dosing Inpatient   Does not apply q1800    Vital Signs    Filed Vitals:   09/29/15 0823 09/29/15 2023 09/30/15 0616 09/30/15 0858  BP: 110/56 107/76 105/77 105/66  Pulse: 65 128 80 97  Temp:  97.7 F (36.5 C) 97.6 F (36.4 C) 97.6 F (36.4 C)  TempSrc:  Oral  Oral  Resp:  18 16 18   Height:      Weight:      SpO2:  100% 97% 98%    Intake/Output Summary (Last 24 hours) at 09/30/15 0943 Last data filed at 09/30/15 0800  Gross per 24 hour  Intake    240 ml  Output    550 ml  Net   -310 ml   Filed Weights   09/28/15 1228 09/28/15 2001  Weight: 189 lb (85.73 kg) 184 lb (83.462 kg)    Physical Exam    General: Pleasant, NAD. Neuro: Alert and oriented X 3. Moves all extremities spontaneously. Psych: Normal affect. HEENT:  Normal  Neck: Supple without bruits or JVD. Lungs:  Resp regular and unlabored, CTA. Heart: Rapid - Irreg-Irreg; no s3, s4, or murmurs. 2/6 SEM at RUSB Abdomen: Soft, non-tender, non-distended, BS + x 4.  Extremities: No  clubbing, cyanosis 2+ edema/anasarca to thighs Bilaterally.  DP/PT - difficult to palpate; Radials 2+ and equal bilaterally.  Labs    CBC  Recent Labs  09/28/15 1233  WBC 4.9  HGB 12.1  HCT 37.2  MCV 81.5  PLT [id number]   Basic Metabolic Panel  Recent Labs  09/28/15 1233 09/29/15 0442 09/29/15 1446 09/30/15 0552  NA 133*  --   --  136  K 5.2*  --  4.3 4.5  CL 101  --   --  101  CO2 23  --   --  28  GLUCOSE 94  --   --  109*  BUN 84*  --   --  75*  CREATININE 2.29* 2.14*  --  1.89*  CALCIUM 9.5  --   --  9.8   Liver Function Tests No results for input(s): AST, ALT, ALKPHOS, BILITOT, PROT, ALBUMIN in the last 72 hours. No results for input(s): LIPASE, AMYLASE in the last 72 hours. Cardiac Enzymes  Recent Labs  09/28/15 1233  TROPONINI <0.03   BNP - was 559 on admission (consistent with known CHF) Invalid input(s): POCBNP  D-Dimer No results for input(s): DDIMER in the last 72 hours. Hemoglobin A1C No results for input(s): HGBA1C in the last 72 hours. Fasting Lipid Panel No results for input(s): CHOL, HDL, LDLCALC, TRIG, CHOLHDL, LDLDIRECT in the last 72 hours. Thyroid Function Tests No results for input(s): TSH, T4TOTAL, T3FREE, THYROIDAB in the last 72 hours.  Invalid input(s): FREET3  Telemetry    Afib ~100-120s  ECG    No new EKG  Radiology    No new study  FROM CARE EVERYWHERE Cardiac Catheterization: 09/01/2015  RHC 09/01/2015 - "Right Heart Catheterization State: Baseline RA: 11 mmHg (mean); RV: 52/ 12 mmHg; PA: 52/ 20 30 mmHg (mean); PCW: 18 mmHg (mean) AV O2: 4.2 vol% Cardiac output: 5.3 L/min; Cardiac index: 2.7 L/min-m2 PVR: 2.3 Wood units Shunt Ratio: 1.0 (Qp/Qs); L to R shunt: 0.0 L/min (Qp-Qep); R to L shunt: 0.0 L/min (Qs-Qep)  Impressions Mild elevation of filling pressures, normal CI Normal mechanical MVR mobility No gradient from low IVC to RA Hemodynamics not c/w constrictive pericarditis Mild Pulmonary  hypertension Recommendations Cont medical evaluation and treatment"   Echocardiogram: 08/17/2015 LEFT VENTRICLE Anterior: HYPOCONTRACTILE Size: Normal Lateral: HYPOCONTRACTILE Contraction: SEVERE GLOBAL DECREASE Septal: HYPOCONTRACTILE Closest EF: 25% (Estimated) Apical: HYPOCONTRACTILE LV masses: No Masses Inferior: AKINETIC LVH: MODERATE LVH CONCENTRIC Posterior: HYPOCONTRACTILE Dias.FxClass: can't be determined  MITRAL VALVE Leaflets: ABNORMAL Mobility: Fully mobile Morphology: MECH PROSTHETIC MV Note: 49mm ST JUDE MECHANICAL  LEFT ATRIUM Size: MILDLY ENLARGED LA masses: No masses Normal IAS  SEVERE LV DYSFUNCTION (See above) WITH MODERATE LVH MODERATE RV SYSTOLIC DYSFUNCTION (See above) VALVULAR REGURGITATION: TRIVIAL AR, TRIVIAL PR, MILD TR PROSTHETIC VALVE(S): MECH PROSTHETIC MV S/P MV REPLACEMENT ACCURACY OF ASSESSMENT OF LV AND RV FUNCTION LIMITED IN SETTING OF AFIB WITH RVR  Assessment & Plan    Active Problems:   Atrial fibrillation with rapid ventricular response (HCC) / Apparent Persistent Afib.  Not sure of chronicity, but would expext chronic/persistent arrhythmia. --> Would benefit from restoring NSR - but with Valvular CHF this is unlikely.  May need to consider Antiarrythmic (Amiodarone is probably safest option -- consider starting if HR proves hard to control. -- will monitor for now as HR seems to be stable     Systolic and diastolic CHF, acute on chronic (HCC) - with   Acute CHF (congestive heart failure) ([place]) - recent d/c weight was 178-179 (not 160 lb) - modest UOP o/n.  Clearly volume overloaded but not sure how much is CHF related   Gentle diuresis - renal Fxn is improving with diuresis (& improved rate control)  Holding afterload reduction (especially in light of ~hypotension -- would prefer to use Long-acting BB for long-term management..  Consider intermittent Metolazone if Lasix is not sufficient.    Chronic kidney disease (CKD) - with ARF  (acute renal failure) (HCC) -- ?? Nephrology following (based upon recent [place], would not expect Cardio-Renal.  Cr improved after diuresis..  Continue diuresis  Holding Spironolactone & Losartan (BP doing well - but still a bit low).    Bilateral leg edema - multifactorial with combination of some CHF, but more Lymphedema related based upon RHC @ [facility] -> continue diuresis.   Long term current use of anticoagulant therapy - on Warfarin for Afib & Mechanical MVR    S/P MVR (mitral valve replacement) - ? If CHF is at least partially valvular   Lymphedema - bandaging / wraps (unaboot) - agree with [facility] consultation: ? Compression pumps.   Signed,  Leonie Man, M.D., M.S.  [employer]  [address] [place], [place] [address] [phone] Fax [phone]

## 2015-09-30 NOTE — Progress Notes (Signed)
ANTICOAGULATION CONSULT NOTE - FOLLOW UP   Pharmacy Consult for Warfarin Indication: Chronic Afib with Mitral Valve Replacement  Allergies  Allergen Reactions  . Ace Inhibitors     Other reaction(s): Cough  . Hydrocodone Itching and Nausea Only  . Mercury     Other reaction(s): Unknown  . Nickel   . Silver Dermatitis    Severe itching  . Cephalexin Rash  . Penicillins Rash    Patient Measurements: Height: 5' 8.5" (174 cm) Weight: 184 lb (83.462 kg) IBW/kg (Calculated) : 65.05 Heparin Dosing Weight:   Vital Signs: Temp: 97.6 F (36.4 C) (04/01 0858) Temp Source: Oral (04/01 0858) BP: 105/66 mmHg (04/01 0858) Pulse Rate: 97 (04/01 0858)  Labs:  Recent Labs  09/28/15 1233 09/29/15 0442 09/30/15 0552  HGB 12.1  --   --   HCT 37.2  --   --   PLT 150  --   --   LABPROT 29.0* 31.6* 31.9*  INR 2.79 3.13 3.17  CREATININE 2.29* 2.14* 1.89*  TROPONINI <0.03  --   --     Estimated Creatinine Clearance: 28.1 mL/min (by C-G formula based on Cr of 1.89).   Medical History: Past Medical History  Diagnosis Date  . Shortness of breath dyspnea   . Hypertension   . Chronic combined systolic (congestive) and diastolic (congestive) heart failure (HCC)     a. EF 25% by echo in 08/2015 b. RHC in 08/2015 showed normal filling pressures  . GERD (gastroesophageal reflux disease)   . Arthritis   . Chronic atrial fibrillation ([place])     a. On Coumadin  . S/P MVR (mitral valve replacement)     a. MVR 1994 b. redo MVR in 08/2014 - on Coumadin    Medications:  Scheduled:  . aspirin EC  81 mg Oral Daily  . furosemide  40 mg Intravenous BID  . metoprolol succinate  100 mg Oral Daily  . multivitamin with minerals  1 tablet Oral Daily  . pantoprazole  40 mg Oral Daily  . sodium polystyrene  30 g Oral Once  . warfarin  5 mg Oral Once per day on Sun Mon Tue Thu Sat  . warfarin  7.5 mg Oral Once per day on Wed Fri  . Warfarin - Pharmacist Dosing Inpatient   Does not apply q1800     Assessment: 81 yo admitted with SOB, pulmonary edema, CHF.  Hx chronic atrial fibrillation on Coumadin, mitral valve replacement. Warfarin dose at home per PTA meds:  5 mg Mon, Tues, Thur, Sat,Sun  And 7.5mg   Wed, Fri.  3/30  INR 2.79 3/31 INR 3.13 4/1:  INR: 3.17  Goal of Therapy:  INR 2.5-3.5 Monitor platelets by anticoagulation protocol: Yes   Plan:  Continue current home regimen. F/u INR in am.  Larene Beach, PharmD Clinical Pharmacist 09/30/2015,9:11 AM

## 2015-10-01 LAB — BASIC METABOLIC PANEL
Anion gap: 9 (ref 5–15)
BUN: 63 mg/dL — AB (ref 6–20)
CALCIUM: 9.6 mg/dL (ref 8.9–10.3)
CO2: 26 mmol/L (ref 22–32)
Chloride: 100 mmol/L — ABNORMAL LOW (ref 101–111)
Creatinine, Ser: 1.66 mg/dL — ABNORMAL HIGH (ref 0.44–1.00)
GFR calc Af Amer: 33 mL/min — ABNORMAL LOW (ref >60)
GFR, EST NON AFRICAN AMERICAN: 28 mL/min — AB (ref >60)
GLUCOSE: 113 mg/dL — AB (ref 65–99)
POTASSIUM: 4.1 mmol/L (ref 3.5–5.1)
SODIUM: 135 mmol/L (ref 135–145)

## 2015-10-01 LAB — PROTIME-INR
INR: 3.13
PROTHROMBIN TIME: 31.6 s — AB (ref 11.4–15.0)

## 2015-10-01 NOTE — Progress Notes (Signed)
Patient Name: [name] Date of Encounter: 10/01/2015  Hospital Problem List     Active Problems:   Atrial fibrillation with rapid ventricular response (HCC)   ARF (acute renal failure) (HCC)   Systolic and diastolic CHF, acute on chronic (HCC)   Chronic kidney disease (CKD)   Bilateral leg edema   Long term current use of anticoagulant therapy   S/P MVR (mitral valve replacement)   Lymphedema   Acute CHF (congestive heart failure) (HCC)    Subjective   Edema & breathing improved minimally - no more SOB Feels better overall.  Inpatient Medications    . aspirin EC  81 mg Oral Daily  . furosemide  40 mg Intravenous BID  . metoprolol succinate  50 mg Oral Daily  . multivitamin with minerals  1 tablet Oral Daily  . pantoprazole  40 mg Oral Daily  . sodium polystyrene  30 g Oral Once  . warfarin  5 mg Oral Once per day on Sun Mon Tue Thu Sat  . warfarin  7.5 mg Oral Once per day on Wed Fri  . Warfarin - Pharmacist Dosing Inpatient   Does not apply q1800    Vital Signs    Filed Vitals:   10/01/15 0545 10/01/15 0546 10/01/15 0937 10/01/15 1130  BP: 90/68 104/68 146/123 99/56  Pulse: 87 115 80 112  Temp: 97.7 F (36.5 C)  97.6 F (36.4 C) 97.9 F (36.6 C)  TempSrc: Oral  Oral Oral  Resp: 18  20 20   Height:      Weight: 183 lb 3.2 oz (83.099 kg)     SpO2: 94%  100% 97%    Intake/Output Summary (Last 24 hours) at 10/01/15 1237 Last data filed at 10/01/15 1200  Gross per 24 hour  Intake    240 ml  Output    425 ml  Net   -185 ml   Filed Weights   09/28/15 1228 09/28/15 2001 10/01/15 0545  Weight: 189 lb (85.73 kg) 184 lb (83.462 kg) 183 lb 3.2 oz (83.099 kg)    Physical Exam    General: Pleasant, NAD. Neuro: Alert and oriented X 3. Moves all extremities spontaneously. Psych: Normal affect. HEENT:  Normal  Neck: Supple without bruits or JVD. Lungs:  Resp regular and unlabored, CTA. Heart: Rapid - Irreg-Irreg; no s3, s4, or murmurs. 2/6 SEM at  RUSB Abdomen: Soft, non-tender, non-distended, BS + x 4.  Extremities: No clubbing, cyanosis 2+ edema/anasarca to thighs Bilaterally.  DP/PT - difficult to palpate; Radials 2+ and equal bilaterally.  Labs    CBC No results for input(s): WBC, NEUTROABS, HGB, HCT, MCV, PLT in the last 72 hours. Basic Metabolic Panel  Recent Labs  09/30/15 0552 10/01/15 0557  NA 136 135  K 4.5 4.1  CL 101 100*  CO2 28 26  GLUCOSE 109* 113*  BUN 75* 63*  CREATININE 1.89* 1.66*  CALCIUM 9.8 9.6   Liver Function Tests No results for input(s): AST, ALT, ALKPHOS, BILITOT, PROT, ALBUMIN in the last 72 hours. No results for input(s): LIPASE, AMYLASE in the last 72 hours. Cardiac Enzymes No results for input(s): CKTOTAL, CKMB, CKMBINDEX, TROPONINI in the last 72 hours. BNP - was 559 on admission (consistent with known CHF) Invalid input(s): POCBNP D-Dimer No results for input(s): DDIMER in the last 72 hours. Hemoglobin A1C No results for input(s): HGBA1C in the last 72 hours. Fasting Lipid Panel No results for input(s): CHOL, HDL, LDLCALC, TRIG, CHOLHDL, LDLDIRECT in the last  72 hours. Thyroid Function Tests No results for input(s): TSH, T4TOTAL, T3FREE, THYROIDAB in the last 72 hours.  Invalid input(s): FREET3  Telemetry    Afib ~100-120s  ECG    No new EKG  Radiology    No new study  FROM CARE EVERYWHERE Cardiac Catheterization: 09/01/2015  RHC 09/01/2015 - "Right Heart Catheterization State: Baseline RA: 11 mmHg (mean); RV: 52/ 12 mmHg; PA: 52/ 20 30 mmHg (mean); PCW: 18 mmHg (mean) AV O2: 4.2 vol% Cardiac output: 5.3 L/min; Cardiac index: 2.7 L/min-m2 PVR: 2.3 Wood units Shunt Ratio: 1.0 (Qp/Qs); L to R shunt: 0.0 L/min (Qp-Qep); R to L shunt: 0.0 L/min (Qs-Qep)  Impressions Mild elevation of filling pressures, normal CI Normal mechanical MVR mobility No gradient from low IVC to RA Hemodynamics not c/w constrictive pericarditis Mild Pulmonary  hypertension Recommendations Cont medical evaluation and treatment"   Echocardiogram: 08/17/2015 LEFT VENTRICLE Anterior: HYPOCONTRACTILE Size: Normal Lateral: HYPOCONTRACTILE Contraction: SEVERE GLOBAL DECREASE Septal: HYPOCONTRACTILE Closest EF: 25% (Estimated) Apical: HYPOCONTRACTILE LV masses: No Masses Inferior: AKINETIC LVH: MODERATE LVH CONCENTRIC Posterior: HYPOCONTRACTILE Dias.FxClass: can't be determined  MITRAL VALVE Leaflets: ABNORMAL Mobility: Fully mobile Morphology: MECH PROSTHETIC MV Note: 61mm ST JUDE MECHANICAL  LEFT ATRIUM Size: MILDLY ENLARGED LA masses: No masses Normal IAS  SEVERE LV DYSFUNCTION (See above) WITH MODERATE LVH MODERATE RV SYSTOLIC DYSFUNCTION (See above) VALVULAR REGURGITATION: TRIVIAL AR, TRIVIAL PR, MILD TR PROSTHETIC VALVE(S): MECH PROSTHETIC MV S/P MV REPLACEMENT ACCURACY OF ASSESSMENT OF LV AND RV FUNCTION LIMITED IN SETTING OF AFIB WITH RVR  Assessment & Plan    Active Problems:   Atrial fibrillation with rapid ventricular response (HCC) / Apparent Persistent Afib.  Not sure of chronicity, but would expext chronic/persistent arrhythmia. --> Would benefit from restoring NSR - but with Valvular CHF this is unlikely.  May need to consider Antiarrythmic (Amiodarone is probably safest option -- consider starting if HR proves hard to control. -- will monitor for now as HR seems to be stable)     Systolic and diastolic CHF, acute on chronic (HCC) - with   Acute CHF (congestive heart failure) ([place]) - recent d/c weight was 178-179 (not 160 lb) - modest UOP o/n.  Clearly volume overloaded but not sure how much is CHF related   Gentle diuresis - renal Fxn is improving with diuresis (& improved rate control)  Holding afterload reduction (especially in light of ~hypotension -- would prefer to use Long-acting BB for long-term management..  Consider intermittent Metolazone if Lasix is not sufficient.    Chronic kidney disease (CKD) - with  ARF (acute renal failure) (HCC) -- ?? Nephrology following (based upon recent [place], would not expect Cardio-Renal.  Cr improved after diuresis   Continue diuresis -consider adding Metolazone for additional diuretic effect.  Holding Spironolactone & Losartan (BP doing well - but still a bit low).    Bilateral leg edema - multifactorial with combination of some CHF, but more Lymphedema related based upon RHC @ [facility] -> continue diuresis.   Long term current use of anticoagulant therapy - on Warfarin for Afib & Mechanical MVR    S/P MVR (mitral valve replacement) - ? If CHF is at least partially valvular   Lymphedema - bandaging / wraps (unaboot) - agree with [facility] consultation: ? Compression pumps - but not on L leg given recent surgical wound.   Signed,  Leonie Man, M.D., M.S.  [employer]  [address], [place] [address] [phone] Fax [phone])  [phone]    

## 2015-10-01 NOTE — Progress Notes (Signed)
[facility] NAME: [name]    MR#:  [id number]  DATE OF BIRTH:  07-Oct-1934  SUBJECTIVE:  CHIEF COMPLAINT:   Chief Complaint  Patient presents with  . Shortness of Breath  . Leg Swelling   Came with severe leg swelling and weight gain, was admitted to [facility] last month- but did not have much diuresis there. Sent in by her PMD. Have good response to IV lasix. Still have significant edema on legs, having una boots and compressive pumps on legs. Some improvement on leg edema. Urine output is not properly measured, but renal function improving.  REVIEW OF SYSTEMS:  CONSTITUTIONAL: No fever, fatigue or weakness.  EYES: No blurred or double vision.  EARS, NOSE, AND THROAT: No tinnitus or ear pain.  RESPIRATORY: No cough, shortness of breath, wheezing or hemoptysis.  CARDIOVASCULAR: No chest pain, orthopnea,positive for severe edema.  GASTROINTESTINAL: No nausea, vomiting, diarrhea or abdominal pain.  GENITOURINARY: No dysuria, hematuria.  ENDOCRINE: No polyuria, nocturia,  HEMATOLOGY: No anemia, easy bruising or bleeding SKIN: No rash or lesion. MUSCULOSKELETAL: No joint pain or arthritis.   NEUROLOGIC: No tingling, numbness, weakness.  PSYCHIATRY: No anxiety or depression.   ROS  DRUG ALLERGIES:   Allergies  Allergen Reactions  . Ace Inhibitors     Other reaction(s): Cough  . Hydrocodone Itching and Nausea Only  . Mercury     Other reaction(s): Unknown  . Nickel   . Silver Dermatitis    Severe itching  . Cephalexin Rash  . Penicillins Rash    VITALS:  Blood pressure 99/56, pulse 112, temperature 97.9 F (36.6 C), temperature source Oral, resp. rate 20, height 5' 8.5" (1.74 m), weight 83.099 kg (183 lb 3.2 oz), SpO2 97 %.  PHYSICAL EXAMINATION:  GENERAL:  81 y.o.-year-old patient lying in the bed with no acute distress.  EYES: Pupils equal, round, reactive to light and accommodation. No scleral  icterus. Extraocular muscles intact.  HEENT: Head atraumatic, normocephalic. Oropharynx and nasopharynx clear.  NECK:  Supple, no jugular venous distention. No thyroid enlargement, no tenderness.  LUNGS: Normal breath sounds bilaterally, no wheezing, rales,rhonchi or crepitation. No use of accessory muscles of respiration.  CARDIOVASCULAR: S1, S2 normal. Positive for murmurs, severe b/l leg and thigh edema ABDOMEN: Soft, nontender, nondistended. Bowel sounds present. No organomegaly or mass.  EXTREMITIES: severe pedal edema, no cyanosis, or clubbing.  NEUROLOGIC: Cranial nerves II through XII are intact. Muscle strength 5/5 in all extremities. Sensation intact. Gait not checked.  PSYCHIATRIC: The patient is alert and oriented x 3.  SKIN: No obvious rash, lesion, or ulcer.   Physical Exam LABORATORY PANEL:   CBC  Recent Labs Lab 09/28/15 1233  WBC 4.9  HGB 12.1  HCT 37.2  PLT 150   ------------------------------------------------------------------------------------------------------------------  Chemistries   Recent Labs Lab 10/01/15 0557  NA 135  K 4.1  CL 100*  CO2 26  GLUCOSE 113*  BUN 63*  CREATININE 1.66*  CALCIUM 9.6   ------------------------------------------------------------------------------------------------------------------  Cardiac Enzymes  Recent Labs Lab 09/28/15 1233  TROPONINI <0.03   ------------------------------------------------------------------------------------------------------------------  RADIOLOGY:  No results found.  ASSESSMENT AND PLAN:   * Ac on ch systolic CHF   IV lasix, I/o monitor, Fluid restriction, Cardio consult appreciated.   Having diuresis, still significant edema.   Spoke to nurse for strict out put monitoring.  * Ac renal failure   Likely due to cardiac failure- and decreased circulation.   IV lasix, monitor.  Nephrology on case.   Renal functio improving gradually.  * Hyperkalemia   Now normal after  lasix.  * Hypertension   Metoprolol, lasix  decreased metoprolol, as BP running on lower side.  * s//p valvular replacement   On coumadin, INR therapeutic, continue.  * a fib   As per cardiologist, this may be playing a role in CHF, so may need better control per cardio.   All the records are reviewed and case discussed with Care Management/Social Workerr. Management plans discussed with the patient, family and they are in agreement.  CODE STATUS: Full.  TOTAL TIME TAKING CARE OF THIS PATIENT: 35 minutes.   son present in room during my visit.  POSSIBLE D/C IN 2-3 DAYS, DEPENDING ON CLINICAL CONDITION.   [name] M.D on 10/01/2015   Between 7am to 6pm - Pager - [phone]  After 6pm go to [email or web address] - password [email or web address] [facility]  Office  [phone]  CC: Primary care physician; Rusty Aus, MD  Note: This dictation was prepared with Dragon dictation along with smaller phrase technology. Any transcriptional errors that result from this process are unintentional.

## 2015-10-01 NOTE — Progress Notes (Signed)
ANTICOAGULATION CONSULT NOTE - FOLLOW UP   Pharmacy Consult for Warfarin Indication: Chronic Afib with Mitral Valve Replacement  Allergies  Allergen Reactions  . Ace Inhibitors     Other reaction(s): Cough  . Hydrocodone Itching and Nausea Only  . Mercury     Other reaction(s): Unknown  . Nickel   . Silver Dermatitis    Severe itching  . Cephalexin Rash  . Penicillins Rash    Patient Measurements: Height: 5' 8.5" (174 cm) Weight: 183 lb 3.2 oz (83.099 kg) IBW/kg (Calculated) : 65.05 Heparin Dosing Weight:   Vital Signs: Temp: 97.7 F (36.5 C) (04/02 0545) Temp Source: Oral (04/02 0545) BP: 104/68 mmHg (04/02 0546) Pulse Rate: 115 (04/02 0546)  Labs:  Recent Labs  09/28/15 1233 09/29/15 0442 09/30/15 0552 10/01/15 0557  HGB 12.1  --   --   --   HCT 37.2  --   --   --   PLT 150  --   --   --   LABPROT 29.0* 31.6* 31.9* 31.6*  INR 2.79 3.13 3.17 3.13  CREATININE 2.29* 2.14* 1.89* 1.66*  TROPONINI <0.03  --   --   --     Estimated Creatinine Clearance: 31.9 mL/min (by C-G formula based on Cr of 1.66).   Medical History: Past Medical History  Diagnosis Date  . Shortness of breath dyspnea   . Hypertension   . Chronic combined systolic (congestive) and diastolic (congestive) heart failure (HCC)     a. EF 25% by echo in 08/2015 b. RHC in 08/2015 showed normal filling pressures  . GERD (gastroesophageal reflux disease)   . Arthritis   . Chronic atrial fibrillation ([place])     a. On Coumadin  . S/P MVR (mitral valve replacement)     a. MVR 1994 b. redo MVR in 08/2014 - on Coumadin    Medications:  Scheduled:  . aspirin EC  81 mg Oral Daily  . furosemide  40 mg Intravenous BID  . metoprolol succinate  50 mg Oral Daily  . multivitamin with minerals  1 tablet Oral Daily  . pantoprazole  40 mg Oral Daily  . sodium polystyrene  30 g Oral Once  . warfarin  5 mg Oral Once per day on Sun Mon Tue Thu Sat  . warfarin  7.5 mg Oral Once per day on Wed Fri  .  Warfarin - Pharmacist Dosing Inpatient   Does not apply q1800    Assessment: 81 yo admitted with SOB, pulmonary edema, CHF.  Hx chronic atrial fibrillation on Coumadin, mitral valve replacement. Warfarin dose at home per PTA meds:  5 mg Mon, Tues, Thur, Sat,Sun  And 7.5mg   Wed, Fri.  3/30  INR 2.79 3/31 INR 3.13 4/1:  INR: 3.17 4/2: INR: 3.13  Goal of Therapy:  INR 2.5-3.5 Monitor platelets by anticoagulation protocol: Yes   Plan:  Continue current home regimen. F/u INR in am.  Larene Beach, PharmD Clinical Pharmacist 10/01/2015,8:05 AM

## 2015-10-01 NOTE — Progress Notes (Signed)
Contacted [facility] re weeping edema wetting the patient's left Unna boot.  Per MD Ether Griffins remove unna boots and order [place] of lower extrems to check for DVT.  Placed petroleum gauze and Allevyn on left LE wounds at patient's request to protect her skin.  OK to place [employer] back on after [place].

## 2015-10-01 NOTE — Progress Notes (Signed)
[facility]  ROUNDING NOTE   Subjective:   Sitting in chair. States her edema and breathing are better.  UOP recorded at 292m. - furosemide 43miv q12 Creatinine 1.66 (1.89) (2.14)  Objective:  Vital signs in last 24 hours:  Temp:  [97.5 F (36.4 C)-97.7 F (36.5 C)] 97.7 F (36.5 C) (04/02 0545) Pulse Rate:  [87-131] 115 (04/02 0546) Resp:  [18-21] 18 (04/02 0545) BP: (87-111)/(66-70) 104/68 mmHg (04/02 0546) SpO2:  [94 %-98 %] 94 % (04/02 0545) Weight:  [83.099 kg (183 lb 3.2 oz)] 83.099 kg (183 lb 3.2 oz) (04/02 0545)  Weight change:  Filed Weights   09/28/15 1228 09/28/15 2001 10/01/15 0545  Weight: 85.73 kg (189 lb) 83.462 kg (184 lb) 83.099 kg (183 lb 3.2 oz)    Intake/Output: I/O last 3 completed shifts: In: 240 [P.O.:240] Out: 775 [Urine:775]   Intake/Output this shift:     Physical Exam: General: NAD, sitting in chair  Head: Normocephalic, atraumatic. Moist oral mucosal membranes  Eyes: Anicteric, PERRL  Neck: Supple, trachea midline  Lungs:  Clear bilaterally  Heart: Regular rate and rhythm  Abdomen:  Soft, nontender,   Extremities:  ++ peripheral edema.  Neurologic: Nonfocal, moving all four extremities  Skin: No lesions       Basic Metabolic Panel:  Recent Labs Lab 09/28/15 1233 09/29/15 0442 09/29/15 1446 09/30/15 0552 10/01/15 0557  NA 133*  --   --  136 135  K 5.2*  --  4.3 4.5 4.1  CL 101  --   --  101 100*  CO2 23  --   --  28 26  GLUCOSE 94  --   --  109* 113*  BUN 84*  --   --  75* 63*  CREATININE 2.29* 2.14*  --  1.89* 1.66*  CALCIUM 9.5  --   --  9.8 9.6    Liver Function Tests: No results for input(s): AST, ALT, ALKPHOS, BILITOT, PROT, ALBUMIN in the last 168 hours. No results for input(s): LIPASE, AMYLASE in the last 168 hours. No results for input(s): AMMONIA in the last 168 hours.  CBC:  Recent Labs Lab 09/28/15 1233  WBC 4.9  HGB 12.1  HCT 37.2  MCV 81.5  PLT 150    Cardiac  Enzymes:  Recent Labs Lab 09/28/15 1233  TROPONINI <0.03    BNP: Invalid input(s): POCBNP  CBG: No results for input(s): GLUCAP in the last 168 hours.  Microbiology: Results for orders placed or performed in visit on 03/22/12  Urine culture     Status: None   Collection Time: 03/22/12 11:51 AM  Result Value Ref Range Status   Micro Text Report   Final       SOURCE: CC    COMMENT                   MIXED BACTERIAL ORGANISMS   COMMENT                   RESULTS SUGGESTIVE OF CONTAMINATION   ANTIBIOTIC                                                      Culture, blood (single)     Status: None   Collection Time: 03/22/12 12:03 PM  Result Value Ref Range Status  Micro Text Report   Final       COMMENT                   NO GROWTH AEROBICALLY/ANAEROBICALLY IN 5 DAYS   ANTIBIOTIC                                                      Culture, blood (single)     Status: None   Collection Time: 03/22/12 12:08 PM  Result Value Ref Range Status   Micro Text Report   Final       COMMENT                   NO GROWTH AEROBICALLY/ANAEROBICALLY IN 5 DAYS   ANTIBIOTIC                                                      Culture, blood (single)     Status: None   Collection Time: 03/24/12 11:09 AM  Result Value Ref Range Status   Micro Text Report   Final       COMMENT                   NO GROWTH AEROBICALLY/ANAEROBICALLY IN 5 DAYS   ANTIBIOTIC                                                        Coagulation Studies:  Recent Labs  09/28/15 1233 09/29/15 0442 09/30/15 0552 10/01/15 0557  LABPROT 29.0* 31.6* 31.9* 31.6*  INR 2.79 3.13 3.17 3.13    Urinalysis: No results for input(s): COLORURINE, LABSPEC, PHURINE, GLUCOSEU, HGBUR, BILIRUBINUR, KETONESUR, PROTEINUR, UROBILINOGEN, NITRITE, LEUKOCYTESUR in the last 72 hours.  Invalid input(s): APPERANCEUR    Imaging: No results found.   Medications:     . aspirin EC  81 mg Oral Daily  . furosemide  40 mg  Intravenous BID  . metoprolol succinate  50 mg Oral Daily  . multivitamin with minerals  1 tablet Oral Daily  . pantoprazole  40 mg Oral Daily  . sodium polystyrene  30 g Oral Once  . warfarin  5 mg Oral Once per day on Sun Mon Tue Thu Sat  . warfarin  7.5 mg Oral Once per day on Wed Fri  . Warfarin - Pharmacist Dosing Inpatient   Does not apply q1800   acetaminophen, traMADol, vitamin C  Assessment/ Plan:  [name] is a 81 y.o. white female with hypertension, systolic and diastolic congestive heart failure, mitral valve replacement 09/05/15, hyperlipidemia, atrial fibrillation, anemia, who was admitted to [facility] on 09/28/2015 for Acute on chronic congestive heart failure, unspecified congestive heart failure type ([place]) [I50.9]   1. Acute Renal Failure with hyperkalemia on chronic kidney disease stage III: baseline creatinine of 1.09 eGFR of 48 04/30/15 However with Acute renal failure with creatinine ranging 1.8-2.2 since admission to [facility]. Progression of disease versus acute renal failure from mitral valve replacement. Now with acute cardiorenal syndrome -  Agree with IV furosemide. More likely to change furosemide from torsemide outpatient. IV furosemide for at least another day - hold spironolactone due to renal insufficiency and hyperkalemia. Status post kayexalate on admission - hold losartan. (allergy to ACE-I)  2. Hypertension: with acute exacerbation of systolic and diastolic congestive heart failure with new mitral valve replacement - blood pressure at goal - IV furosemide  3. Hyponatremia: due to hypervolemia. Now resolved with IV furosemide.    LOS: [place], [name] 4/2/20178:27 AM

## 2015-10-01 NOTE — Progress Notes (Signed)
[facility] [place] NAME: [name]    MR#:  [id number]  DATE OF BIRTH:  Sep 19, 1934  SUBJECTIVE:  CHIEF COMPLAINT:   Chief Complaint  Patient presents with  . Shortness of Breath  . Leg Swelling   Came with severe leg swelling and weight gain, was admitted to [facility] last month- but did not have much diuresis there. Sent in by her PMD. Have good response to IV lasix. Still have significant edema on legs, having una boots and compressive pumps on legs. Son is concerned- that she may need to go to rehab at discharge.  REVIEW OF SYSTEMS:  CONSTITUTIONAL: No fever, fatigue or weakness.  EYES: No blurred or double vision.  EARS, NOSE, AND THROAT: No tinnitus or ear pain.  RESPIRATORY: No cough, shortness of breath, wheezing or hemoptysis.  CARDIOVASCULAR: No chest pain, orthopnea,positive for severe edema.  GASTROINTESTINAL: No nausea, vomiting, diarrhea or abdominal pain.  GENITOURINARY: No dysuria, hematuria.  ENDOCRINE: No polyuria, nocturia,  HEMATOLOGY: No anemia, easy bruising or bleeding SKIN: No rash or lesion. MUSCULOSKELETAL: No joint pain or arthritis.   NEUROLOGIC: No tingling, numbness, weakness.  PSYCHIATRY: No anxiety or depression.   ROS  DRUG ALLERGIES:   Allergies  Allergen Reactions  . Ace Inhibitors     Other reaction(s): Cough  . Hydrocodone Itching and Nausea Only  . Mercury     Other reaction(s): Unknown  . Nickel   . Silver Dermatitis    Severe itching  . Cephalexin Rash  . Penicillins Rash    VITALS:  Blood pressure 146/123, pulse 80, temperature 97.6 F (36.4 C), temperature source Oral, resp. rate 20, height 5' 8.5" (1.74 m), weight 83.099 kg (183 lb 3.2 oz), SpO2 100 %.  PHYSICAL EXAMINATION:  GENERAL:  81 y.o.-year-old patient lying in the bed with no acute distress.  EYES: Pupils equal, round, reactive to light and accommodation. No scleral icterus. Extraocular muscles intact.   HEENT: Head atraumatic, normocephalic. Oropharynx and nasopharynx clear.  NECK:  Supple, no jugular venous distention. No thyroid enlargement, no tenderness.  LUNGS: Normal breath sounds bilaterally, no wheezing, rales,rhonchi or crepitation. No use of accessory muscles of respiration.  CARDIOVASCULAR: S1, S2 normal. Positive for murmurs, severe b/l leg and thigh edema ABDOMEN: Soft, nontender, nondistended. Bowel sounds present. No organomegaly or mass.  EXTREMITIES: severe pedal edema, no cyanosis, or clubbing.  NEUROLOGIC: Cranial nerves II through XII are intact. Muscle strength 5/5 in all extremities. Sensation intact. Gait not checked.  PSYCHIATRIC: The patient is alert and oriented x 3.  SKIN: No obvious rash, lesion, or ulcer.   Physical Exam LABORATORY PANEL:   CBC  Recent Labs Lab 09/28/15 1233  WBC 4.9  HGB 12.1  HCT 37.2  PLT 150   ------------------------------------------------------------------------------------------------------------------  Chemistries   Recent Labs Lab 10/01/15 0557  NA 135  K 4.1  CL 100*  CO2 26  GLUCOSE 113*  BUN 63*  CREATININE 1.66*  CALCIUM 9.6   ------------------------------------------------------------------------------------------------------------------  Cardiac Enzymes  Recent Labs Lab 09/28/15 1233  TROPONINI <0.03   ------------------------------------------------------------------------------------------------------------------  RADIOLOGY:  No results found.  ASSESSMENT AND PLAN:   * Ac on ch systolic CHF   IV lasix, I/o monitor, Fluid restriction, Cardio consult appreciated.   Having diuresis,s till significant edema.  * Ac renal failure   Likely due to cardiac failure- and decreased circulation.   IV lasix, monitor.   Nephrology on case.   Renal functio improving.  *  Hyperkalemia   Now normal after lasix.  * Hypertension   Metoprolol, lasix  decreased metoprolol, as BP running on lower  side.  * s//p valvular replacement   On coumadin, INR therapeutic, continue.   All the records are reviewed and case discussed with Care Management/Social Workerr. Management plans discussed with the patient, family and they are in agreement.  CODE STATUS: Full.  TOTAL TIME TAKING CARE OF THIS PATIENT: 35 minutes.   son present in room during my visit.  POSSIBLE D/C IN 2-3 DAYS, DEPENDING ON CLINICAL CONDITION.   [name] M.D on 10/01/2015   Between 7am to 6pm - Pager - [phone]  After 6pm go to [email or web address] - password [email or web address] [facility]  Office  [phone]  CC: Primary care physician; Rusty Aus, MD  Note: This dictation was prepared with Dragon dictation along with smaller phrase technology. Any transcriptional errors that result from this process are unintentional.

## 2015-10-02 ENCOUNTER — Inpatient Hospital Stay: Payer: Medicare Other

## 2015-10-02 ENCOUNTER — Inpatient Hospital Stay (HOSPITAL_COMMUNITY)
Admit: 2015-10-02 | Discharge: 2015-10-02 | Disposition: A | Discharge status: Home / Self Care | Payer: Medicare Other | Attending: Cardiovascular Disease | Admitting: Cardiovascular Disease

## 2015-10-02 DIAGNOSIS — R0600 Dyspnea, unspecified: Secondary | ICD-10-CM

## 2015-10-02 LAB — BASIC METABOLIC PANEL
Anion gap: 8 (ref 5–15)
BUN: 55 mg/dL — ABNORMAL HIGH (ref 6–20)
CHLORIDE: 98 mmol/L — AB (ref 101–111)
CO2: 28 mmol/L (ref 22–32)
CREATININE: 1.75 mg/dL — AB (ref 0.44–1.00)
Calcium: 9.3 mg/dL (ref 8.9–10.3)
GFR calc non Af Amer: 27 mL/min — ABNORMAL LOW (ref >60)
GFR, EST AFRICAN AMERICAN: 31 mL/min — AB (ref >60)
GLUCOSE: 111 mg/dL — AB (ref 65–99)
Potassium: 3.6 mmol/L (ref 3.5–5.1)
Sodium: 134 mmol/L — ABNORMAL LOW (ref 135–145)

## 2015-10-02 LAB — GLUCOSE, CAPILLARY: Glucose-Capillary: 110 mg/dL — ABNORMAL HIGH (ref 65–99)

## 2015-10-02 LAB — PROTIME-INR
INR: 2.7
Prothrombin Time: 28.3 seconds — ABNORMAL HIGH (ref 11.4–15.0)

## 2015-10-02 LAB — ECHOCARDIOGRAM COMPLETE
HEIGHTINCHES: 68.5 in
WEIGHTICAEL: 2915.2 [oz_av]

## 2015-10-02 LAB — ALBUMIN: Albumin: 4 g/dL (ref 3.5–5.0)

## 2015-10-02 MED ORDER — ALBUMIN HUMAN 25 % IV SOLN
12.5000 g | Freq: Once | INTRAVENOUS | Status: AC
Start: 2015-10-02 — End: 2015-10-02
  Administered 2015-10-02: 12.5 g via INTRAVENOUS
  Filled 2015-10-02: qty 50

## 2015-10-02 MED ORDER — AMIODARONE HCL 200 MG PO TABS
400.0000 mg | ORAL_TABLET | Freq: Two times a day (BID) | ORAL | Status: DC
  Administered 2015-10-02 – 2015-10-08 (×13): 400 mg via ORAL
  Filled 2015-10-02 (×13): qty 2

## 2015-10-02 NOTE — Progress Notes (Signed)
Asked to see patient re: leg ulcer and lower extremity edema. Weight up 16# since January when first evaluated for LLE wound. Prior LE ultrasound MRI of intraabdominal vasculature at [facility] in March was negative. (Repeat LE [place] today was negative for DVT as well. Being followed by Ella Jubilee, MD from vascular. LE pumps ordered but not yet received. Had been undergoing weekly wrapping with minimal success.  Exam: Bilateral LE edema to knee, 3+, pitting.  Ulcerated area on left medial calf stable from exam last week.  Had developed a blister on the site of the original debridement, now burst with healthy base. Two other ulcerated areas noted on periphery of January debridement site.  Pedal pulses palpable.  IMP: Severe LE edema with secondary skin changes, no evidence of DVT.  Progressive heart failure with EF down to 25 % since repeat MVR replacement last year. Worsening renal failure secondary to cardiac dysfunction worsening LE edema.  Will touch base w/ Dr. Delana Meyer for his recommendations. Doubt albumin will accelerate diuresis, make make LE edema more resistant to therapy.

## 2015-10-02 NOTE — Progress Notes (Signed)
ANTICOAGULATION CONSULT NOTE - FOLLOW UP   Pharmacy Consult for Warfarin Indication: Chronic Afib with Mitral Valve Replacement  Allergies  Allergen Reactions  . Ace Inhibitors     Other reaction(s): Cough  . Hydrocodone Itching and Nausea Only  . Mercury     Other reaction(s): Unknown  . Nickel   . Silver Dermatitis    Severe itching  . Cephalexin Rash  . Penicillins Rash    Patient Measurements: Height: 5' 8.5" (174 cm) Weight: 182 lb 3.2 oz (82.645 kg) IBW/kg (Calculated) : 65.05 Heparin Dosing Weight:   Vital Signs: Temp: 97.8 F (36.6 C) (04/03 0454) Temp Source: Oral (04/03 0454) BP: 93/70 mmHg (04/03 0454) Pulse Rate: 66 (04/03 0454)  Labs:  Recent Labs  09/30/15 0552 10/01/15 0557 10/02/15 0506  LABPROT 31.9* 31.6* 28.3*  INR 3.17 3.13 2.70  CREATININE 1.89* 1.66* 1.75*    Estimated Creatinine Clearance: 30.2 mL/min (by C-G formula based on Cr of 1.75).   Medical History: Past Medical History  Diagnosis Date  . Shortness of breath dyspnea   . Hypertension   . Chronic combined systolic (congestive) and diastolic (congestive) heart failure (HCC)     a. EF 25% by echo in 08/2015 b. RHC in 08/2015 showed normal filling pressures  . GERD (gastroesophageal reflux disease)   . Arthritis   . Chronic atrial fibrillation ([place])     a. On Coumadin  . S/P MVR (mitral valve replacement)     a. MVR 1994 b. redo MVR in 08/2014 - on Coumadin    Medications:  Scheduled:  . aspirin EC  81 mg Oral Daily  . furosemide  40 mg Intravenous BID  . metoprolol succinate  50 mg Oral Daily  . multivitamin with minerals  1 tablet Oral Daily  . pantoprazole  40 mg Oral Daily  . warfarin  5 mg Oral Once per day on Sun Mon Tue Thu Sat  . warfarin  7.5 mg Oral Once per day on Wed Fri  . Warfarin - Pharmacist Dosing Inpatient   Does not apply q1800    Assessment: 81 yo admitted with SOB, pulmonary edema, CHF.  Hx chronic atrial fibrillation on Coumadin, mitral valve  replacement. Warfarin dose at home per PTA meds:  5 mg Mon, Tues, Thur, Sat,Sun  And 7.5mg   Wed, Fri.  3/30  INR 2.79 3/31 INR 3.13 4/1:  INR: 3.17 4/2: INR: 3.13 4/3: INR: 2.70  Goal of Therapy:  INR 2.5-3.5 Monitor platelets by anticoagulation protocol: Yes   Plan:  Continue current home regimen. F/u INR in am.  Paulina Fusi, PharmD, BCPS 10/02/2015 1:51 PM

## 2015-10-02 NOTE — Progress Notes (Signed)
[facility]  ROUNDING NOTE   Subjective:   Sitting in chair. Patient's daughter is in the room with her Urine output recorded at 950 cc Serum creatinine 1.75  Objective:  Vital signs in last 24 hours:  Temp:  [97.8 F (36.6 C)-98.6 F (37 C)] 97.8 F (36.6 C) (04/03 0454) Pulse Rate:  [66-107] 66 (04/03 0454) Resp:  [18-20] 18 (04/03 0454) BP: (93-97)/(63-70) 93/70 mmHg (04/03 0454) SpO2:  [96 %] 96 % (04/03 0454) Weight:  [82.645 kg (182 lb 3.2 oz)] 82.645 kg (182 lb 3.2 oz) (04/03 0454)  Weight change: -0.454 kg (-1 lb) Filed Weights   09/28/15 2001 10/01/15 0545 10/02/15 0454  Weight: 83.462 kg (184 lb) 83.099 kg (183 lb 3.2 oz) 82.645 kg (182 lb 3.2 oz)    Intake/Output: I/O last 3 completed shifts: In: 480 [P.O.:480] Out: 1075 [Urine:1075]   Intake/Output this shift:  Total I/O In: 240 [P.O.:240] Out: 350 [Urine:350]  Physical Exam: General: NAD, sitting in chair  Head: Normocephalic, atraumatic. Moist oral mucosal membranes  Eyes: Anicteric,   Neck: Supple, trachea midline  Lungs:  Clear bilaterally  Heart: Irregular, A Fib  Abdomen:  Soft, nontender,   Extremities:  ++ peripheral edema. Edema blister on left leg  Neurologic: Nonfocal, moving all four extremities  Skin: No lesions       Basic Metabolic Panel:  Recent Labs Lab 09/28/15 1233 09/29/15 0442 09/29/15 1446 09/30/15 0552 10/01/15 0557 10/02/15 0506  NA 133*  --   --  136 135 134*  K 5.2*  --  4.3 4.5 4.1 3.6  CL 101  --   --  101 100* 98*  CO2 23  --   --  28 26 28   GLUCOSE 94  --   --  109* 113* 111*  BUN 84*  --   --  75* 63* 55*  CREATININE 2.29* 2.14*  --  1.89* 1.66* 1.75*  CALCIUM 9.5  --   --  9.8 9.6 9.3    Liver Function Tests: No results for input(s): AST, ALT, ALKPHOS, BILITOT, PROT, ALBUMIN in the last 168 hours. No results for input(s): LIPASE, AMYLASE in the last 168 hours. No results for input(s): AMMONIA in the last 168 hours.  CBC:  Recent  Labs Lab 09/28/15 1233  WBC 4.9  HGB 12.1  HCT 37.2  MCV 81.5  PLT 150    Cardiac Enzymes:  Recent Labs Lab 09/28/15 1233  TROPONINI <0.03    BNP: Invalid input(s): POCBNP  CBG:  Recent Labs Lab 10/02/15 0752  GLUCAP 110*    Microbiology: Results for orders placed or performed in visit on 03/22/12  Urine culture     Status: None   Collection Time: 03/22/12 11:51 AM  Result Value Ref Range Status   Micro Text Report   Final       SOURCE: CC    COMMENT                   MIXED BACTERIAL ORGANISMS   COMMENT                   RESULTS SUGGESTIVE OF CONTAMINATION   ANTIBIOTIC                                                      Culture,  blood (single)     Status: None   Collection Time: 03/22/12 12:03 PM  Result Value Ref Range Status   Micro Text Report   Final       COMMENT                   NO GROWTH AEROBICALLY/ANAEROBICALLY IN 5 DAYS   ANTIBIOTIC                                                      Culture, blood (single)     Status: None   Collection Time: 03/22/12 12:08 PM  Result Value Ref Range Status   Micro Text Report   Final       COMMENT                   NO GROWTH AEROBICALLY/ANAEROBICALLY IN 5 DAYS   ANTIBIOTIC                                                      Culture, blood (single)     Status: None   Collection Time: 03/24/12 11:09 AM  Result Value Ref Range Status   Micro Text Report   Final       COMMENT                   NO GROWTH AEROBICALLY/ANAEROBICALLY IN 5 DAYS   ANTIBIOTIC                                                        Coagulation Studies:  Recent Labs  09/30/15 0552 10/01/15 0557 10/02/15 0506  LABPROT 31.9* 31.6* 28.3*  INR 3.17 3.13 2.70    Urinalysis: No results for input(s): COLORURINE, LABSPEC, PHURINE, GLUCOSEU, HGBUR, BILIRUBINUR, KETONESUR, PROTEINUR, UROBILINOGEN, NITRITE, LEUKOCYTESUR in the last 72 hours.  Invalid input(s): APPERANCEUR    Imaging: US Venous Img Lower  Bilateral  10/02/2015  CLINICAL DATA:  Lower extremity swelling. EXAM: BILATERAL LOWER EXTREMITY VENOUS DOPPLER ULTRASOUND TECHNIQUE: Gray-scale sonography with graded compression, as well as color Doppler and duplex ultrasound were performed to evaluate the lower extremity deep venous systems from the level of the common femoral vein and including the common femoral, femoral, profunda femoral, popliteal and calf veins including the posterior tibial, peroneal and gastrocnemius veins when visible. The superficial great saphenous vein was also interrogated. Spectral Doppler was utilized to evaluate flow at rest and with distal augmentation maneuvers in the common femoral, femoral and popliteal veins. COMPARISON:  02/29/2012. FINDINGS: RIGHT LOWER EXTREMITY Common Femoral Vein: No evidence of thrombus. Normal compressibility, respiratory phasicity and response to augmentation. Saphenofemoral Junction: No evidence of thrombus. Normal compressibility and flow on color Doppler imaging. Profunda Femoral Vein: No evidence of thrombus. Normal compressibility and flow on color Doppler imaging. Femoral Vein: No evidence of thrombus. Normal compressibility, respiratory phasicity and response to augmentation. Popliteal Vein: No evidence of thrombus. Normal compressibility, respiratory phasicity and response to augmentation. Calf Veins: No evidence of thrombus. Normal compressibility and  flow on color Doppler imaging. Superficial Great Saphenous Vein: No evidence of thrombus. Normal compressibility and flow on color Doppler imaging. Other Findings:  None. LEFT LOWER EXTREMITY Common Femoral Vein: No evidence of thrombus. Normal compressibility, respiratory phasicity and response to augmentation. Saphenofemoral Junction: No evidence of thrombus. Normal compressibility and flow on color Doppler imaging. Profunda Femoral Vein: No evidence of thrombus. Normal compressibility and flow on color Doppler imaging. Femoral Vein: No  evidence of thrombus. Normal compressibility, respiratory phasicity and response to augmentation. Popliteal Vein: No evidence of thrombus. Normal compressibility, respiratory phasicity and response to augmentation. Calf Veins: No evidence of thrombus. Normal compressibility and flow on color Doppler imaging. Superficial Great Saphenous Vein: No evidence of thrombus. Normal compressibility and flow on color Doppler imaging. Other Findings:  None. IMPRESSION: No evidence of deep venous thrombosis. Electronically Signed   By: Marcello Moores  Register   On: 10/02/2015 12:20     Medications:     . albumin human  12.5 g Intravenous Once  . aspirin EC  81 mg Oral Daily  . furosemide  40 mg Intravenous BID  . metoprolol succinate  50 mg Oral Daily  . multivitamin with minerals  1 tablet Oral Daily  . pantoprazole  40 mg Oral Daily  . warfarin  5 mg Oral Once per day on Sun Mon Tue Thu Sat  . warfarin  7.5 mg Oral Once per day on Wed Fri  . Warfarin - Pharmacist Dosing Inpatient   Does not apply q1800   acetaminophen, traMADol, vitamin C  Assessment/ Plan:  [name] is a 81 y.o. white female with hypertension, systolic and diastolic congestive heart failure, mitral valve replacement 09/05/15, hyperlipidemia, atrial fibrillation, anemia, who was admitted to [facility] on 09/28/2015 for Acute on chronic congestive heart failure, unspecified congestive heart failure type ([place]) [I50.9]   1. Acute Renal Failure with hyperkalemia on chronic kidney disease stage III: baseline creatinine of 1.09 eGFR of 48 04/30/15 However with Acute renal failure with creatinine ranging 1.8-2.2 since admission to [facility].  Likely ARF from acute cardiorenal syndrome - Agree with IV furosemide. More likely to change furosemide from torsemide outpatient. IV furosemide for now - hold spironolactone due to renal insufficiency and hyperkalemia. Status post kayexalate on admission - hold losartan. (allergy to ACE-I) - add iv  albumin  2. LE edema with acute exacerbation of systolic and diastolic congestive heart failure with new mitral valve replacement - blood pressure low normal - IV furosemide - consider Dobutamine if cardiac function allows  3. Hyponatremia: due to hypervolemia. Now resolved with IV furosemide.    LOS: 4 [name] 4/3/20172:08 PM

## 2015-10-02 NOTE — Progress Notes (Signed)
*  PRELIMINARY RESULTS* Echocardiogram 2D Echocardiogram has been performed.  [name] 10/02/2015, 3:09 PM

## 2015-10-02 NOTE — Progress Notes (Signed)
Patient: [name] / Admit Date: 09/28/2015 / Date of Encounter: 10/02/2015, 11:17 AM   Subjective: She is for LE doppler today. Feels flushed with her Afib with RVR.   Review of Systems: Review of Systems  Constitutional: Positive for weight loss and malaise/fatigue. Negative for fever, chills and diaphoresis.  HENT: Negative for congestion.   Eyes: Negative for discharge.  Respiratory: Positive for shortness of breath. Negative for cough, hemoptysis, sputum production and wheezing.   Cardiovascular: Positive for leg swelling. Negative for chest pain, palpitations, orthopnea, claudication and PND.  Gastrointestinal: Negative for nausea and vomiting.  Musculoskeletal: Negative for falls.  Skin: Negative for rash.  Neurological: Positive for weakness. Negative for sensory change, speech change, focal weakness and loss of consciousness.  Endo/Heme/Allergies: Does not bruise/bleed easily.  Psychiatric/Behavioral: The patient is not nervous/anxious.      Objective: Telemetry: Afib with RVR, low 100's to 120's Physical Exam: Blood pressure 93/70, pulse 66, temperature 97.8 F (36.6 C), temperature source Oral, resp. rate 18, height 5' 8.5" (1.74 m), weight 182 lb 3.2 oz (82.645 kg), SpO2 96 %. Body mass index is 27.3 kg/(m^2). General: Well developed, well nourished, in no acute distress. Head: Normocephalic, atraumatic, sclera non-icteric, no xanthomas, nares are without discharge. Neck: Negative for carotid bruits. JVP not elevated. Lungs: Clear bilaterally to auscultation without wheezes, rales, or rhonchi. Breathing is unlabored. Heart: Tachycardic, irregularly irregular S1 S2, II/VI SEM murmur at RUSB, no rubs, or gallops.  Abdomen: Soft, non-tender, distended with normoactive bowel sounds. No rebound/guarding. Extremities: No clubbing or cyanosis. 2+ edema to the thighs bilaterally. Distal pedal pulses are 2+ and equal bilaterally. Neuro: Alert and oriented X 3. Moves all  extremities spontaneously. Psych:  Responds to questions appropriately with a normal affect.   Intake/Output Summary (Last 24 hours) at 10/02/15 1117 Last data filed at 10/02/15 0900  Gross per 24 hour  Intake    480 ml  Output   1300 ml  Net   -820 ml    Inpatient Medications:  . aspirin EC  81 mg Oral Daily  . furosemide  40 mg Intravenous BID  . metoprolol succinate  50 mg Oral Daily  . multivitamin with minerals  1 tablet Oral Daily  . pantoprazole  40 mg Oral Daily  . warfarin  5 mg Oral Once per day on Sun Mon Tue Thu Sat  . warfarin  7.5 mg Oral Once per day on Wed Fri  . Warfarin - Pharmacist Dosing Inpatient   Does not apply q1800   Infusions:    Labs:  Recent Labs  10/01/15 0557 10/02/15 0506  NA 135 134*  K 4.1 3.6  CL 100* 98*  CO2 26 28  GLUCOSE 113* 111*  BUN 63* 55*  CREATININE 1.66* 1.75*  CALCIUM 9.6 9.3   No results for input(s): AST, ALT, ALKPHOS, BILITOT, PROT, ALBUMIN in the last 72 hours. No results for input(s): WBC, NEUTROABS, HGB, HCT, MCV, PLT in the last 72 hours. No results for input(s): CKTOTAL, CKMB, TROPONINI in the last 72 hours. Invalid input(s): POCBNP No results for input(s): HGBA1C in the last 72 hours.   Weights: Filed Weights   09/28/15 2001 10/01/15 0545 10/02/15 0454  Weight: 184 lb (83.462 kg) 183 lb 3.2 oz (83.099 kg) 182 lb 3.2 oz (82.645 kg)     Radiology/Studies:  Dg Chest 2 View  09/28/2015  CLINICAL DATA:  81 year old female with increased lower extremity swelling, shortness of breath for 1 day. Initial  encounter. EXAM: CHEST  2 VIEW COMPARISON:  Chest radiographs 03/24/2012. FINDINGS: Larger lung volumes with resolved small pleural effusions present in 2013. Chronic cardiomegaly appears stable. Sequelae of cardiac valve replacement and median sternotomy. Other mediastinal contours are within normal limits. Visualized tracheal air column is within normal limits. No pneumothorax. Pulmonary vascular congestion without  overt edema. No confluent pulmonary opacity. Osteopenia with multilevel chronic appearing thoracic compression fractures. No acute osseous abnormality identified. IMPRESSION: Cardiomegaly and pulmonary vascular congestion without overt edema. Electronically Signed   By: Genevie Ann M.D.   On: 09/28/2015 13:13     Assessment and Plan   1. Persistent Afib with RVR: -Possibly chronic at this point -Would benefit from attempt at restoration of sinus rhythm  -On Coumadin   2. Acute on chronic combined CHF: -Recent discharge weight of 178-179, current weight of 182 on 4/3 -Has diuresed 1.9 L for the admission and 470 mL for the past 24 hours -Renal on board for help with diuresis given patient's CKD  -Has been started on Toprol XL, BP soft, may need to hold -May need intermittent metolazone for added diuresis  3. Acute on CKD stage III: -Renal function slightly up trending today, monitor closely with diuresis -She has already received IV Lasix this AM -Per renal -Spironolactone and losartan on hold given the above  4. Bilateral LE edema: -Multifactorial with CHF and lymphedema -Though felt to be more lymphedema based on RHC at [facility] -Continue diuresis and wraps/unaboots  5. Mechanical mitral valve: -Possibly playing a role in #2 -Coumadin   Signed, Christell Faith, PA-C Pager: [phone] 10/02/2015, 11:17 AM

## 2015-10-02 NOTE — Care Management Important Message (Signed)
Important Message  Patient Details  Name: [name] MRN: [id number] Date of Birth: October 13, 1934   Medicare Important Message Given:  Yes    Juliann Pulse A Adiya Selmer 10/02/2015, 1:49 PM

## 2015-10-03 LAB — BASIC METABOLIC PANEL
Anion gap: 9 (ref 5–15)
BUN: 53 mg/dL — AB (ref 6–20)
CHLORIDE: 99 mmol/L — AB (ref 101–111)
CO2: 27 mmol/L (ref 22–32)
CREATININE: 1.61 mg/dL — AB (ref 0.44–1.00)
Calcium: 9.6 mg/dL (ref 8.9–10.3)
GFR calc Af Amer: 34 mL/min — ABNORMAL LOW (ref >60)
GFR, EST NON AFRICAN AMERICAN: 30 mL/min — AB (ref >60)
Glucose, Bld: 121 mg/dL — ABNORMAL HIGH (ref 65–99)
Potassium: 3.9 mmol/L (ref 3.5–5.1)
SODIUM: 135 mmol/L (ref 135–145)

## 2015-10-03 LAB — TSH: TSH: 1.9 u[IU]/mL (ref 0.350–4.500)

## 2015-10-03 LAB — PROTIME-INR
INR: 2.58
PROTHROMBIN TIME: 27.3 s — AB (ref 11.4–15.0)

## 2015-10-03 NOTE — Progress Notes (Addendum)
Physical Therapy Treatment Patient Details Name: [name] MRN: [id number] DOB: July 10, 1934 Today's Date: 10/03/2015    History of Present Illness Patient is a 81 y/o female that presents with recent mitral valve replacement, admitted for worsening LE edema, weight gain, and acute on chronic CHF.     PT Comments    Pt denies any current problems with mobility. [name] primary concern is seeping from Left lower extremity open sore. Nursing addressed, so that pt may walk. Pt demonstrates independence in transfer from chair and ambulation of 400 ft without loss of balance. Pt preferred not to perform steps today, as Left lower extremity sore does increased in pain with being on her feet. Pt was able to negotiate up/down 13 steps with 1 handrail, modified independence on evaluation. Pt has good understanding of seated exercises to perform regularly each day. Pt agreeable to completing current PT orders with understanding that should she feel a decline in her mobility or function PT would be happy to readdress any issues and re start PT at that time. Complete current PT orders, as all goals have been met.   Follow Up Recommendations  No PT follow up     Equipment Recommendations       Recommendations for Other Services       Precautions / Restrictions Restrictions Weight Bearing Restrictions: No    Mobility  Bed Mobility               General bed mobility comments: Not tested up in chair  Transfers Overall transfer level: Independent Equipment used: None             General transfer comment: No loss of balance identified with sit to stand transfer.   Ambulation/Gait Ambulation/Gait assistance: Independent Ambulation Distance (Feet): 400 Feet Assistive device: None Gait Pattern/deviations: WFL(Within Functional Limits)   Gait velocity interpretation:  (at baseline) General Gait Details: Heart rate increased to 139 beats per minute   Stairs         General  stair comments: perferred not to do steps at this time. Notes having no problems   Wheelchair Mobility    Modified Rankin (Stroke Patients Only)       Balance Overall balance assessment: No apparent balance deficits (not formally assessed)                                  Cognition Arousal/Alertness: Awake/alert Behavior During Therapy: WFL for tasks assessed/performed Overall Cognitive Status: Within Functional Limits for tasks assessed                      Exercises Other Exercises Other Exercises: Pt very familiar with lower extremity exercises from involvement with heart track. Reviewed ankle pumps, march, FAQ, hip ABD/ADD., SLR for strengthening     General Comments General comments (skin integrity, edema, etc.): LE edema with several blisters and seeping. Nurse addressed for ambulation to avoid slip/fall      Pertinent Vitals/Pain Pain Assessment: 0-10 Pain Score: 5  Pain Location: L medial distal lower extremity sore  Pain Descriptors / Indicators: Sore Pain Intervention(s): Other (comment) (Medicated with Tylenol and Tramadol)    Home Living                      Prior Function            PT Goals (current goals can now be found in  the care plan section)      Frequency       PT Plan Current plan remains appropriate    Co-evaluation             End of Session   Activity Tolerance: Patient tolerated treatment well Pt left in chair with cell/phone in reach.     Time: 9574-7340 PT Time Calculation (min) (ACUTE ONLY): 33 min  Charges:  $Gait Training: 8-22 mins $Therapeutic Exercise: 8-22 mins                    G Codes:      Charlaine Dalton, PTA 10/03/2015, 3:43 PM

## 2015-10-03 NOTE — Progress Notes (Signed)
[facility] [place] NAME: [name]    MR#:  [id number]  DATE OF BIRTH:  1935/05/18  SUBJECTIVE:  CHIEF COMPLAINT:   Chief Complaint  Patient presents with  . Shortness of Breath  . Leg Swelling   Came with severe leg swelling and weight gain, was admitted to [place] last month- but did not have much diuresis there. Sent in by her PMD. Have good response to IV lasix. Still have significant edema on legs, was having una boots and compressive pumps on legs. Now have skin breakdown and blister on left leg. Some improvement on leg edema. Urine output is now properly measured, renal function improving.   Leg edema still same.  pt is sitting or walking in room when I visit.  REVIEW OF SYSTEMS:  CONSTITUTIONAL: No fever, fatigue or weakness.  EYES: No blurred or double vision.  EARS, NOSE, AND THROAT: No tinnitus or ear pain.  RESPIRATORY: No cough, shortness of breath, wheezing or hemoptysis.  CARDIOVASCULAR: No chest pain, orthopnea,positive for severe edema.  GASTROINTESTINAL: No nausea, vomiting, diarrhea or abdominal pain.  GENITOURINARY: No dysuria, hematuria.  ENDOCRINE: No polyuria, nocturia,  HEMATOLOGY: No anemia, easy bruising or bleeding SKIN: No rash or lesion. MUSCULOSKELETAL: No joint pain or arthritis.   NEUROLOGIC: No tingling, numbness, weakness.  PSYCHIATRY: No anxiety or depression.   ROS  DRUG ALLERGIES:   Allergies  Allergen Reactions  . Ace Inhibitors     Other reaction(s): Cough  . Hydrocodone Itching and Nausea Only  . Mercury     Other reaction(s): Unknown  . Nickel   . Silver Dermatitis    Severe itching  . Cephalexin Rash  . Penicillins Rash    VITALS:  Blood pressure 98/68, pulse 91, temperature 98.4 F (36.9 C), temperature source Oral, resp. rate 18, height 5' 8.5" (1.74 m), weight 82.419 kg (181 lb 11.2 oz), SpO2 96 %.  PHYSICAL EXAMINATION:  GENERAL:  81 y.o.-year-old patient  sitting in chair, with no acute distress.  EYES: Pupils equal, round, reactive to light and accommodation. No scleral icterus. Extraocular muscles intact.  HEENT: Head atraumatic, normocephalic. Oropharynx and nasopharynx clear.  NECK:  Supple, no jugular venous distention. No thyroid enlargement, no tenderness.  LUNGS: Normal breath sounds bilaterally, no wheezing, rales,rhonchi or crepitation. No use of accessory muscles of respiration.  CARDIOVASCULAR: S1, S2 normal. Positive for murmurs, severe b/l leg and thigh edema ABDOMEN: Soft, nontender, nondistended. Bowel sounds present. No organomegaly or mass.  EXTREMITIES: severe pedal edema, no cyanosis, or clubbing. On left leg- skin breakdown with a large 5 cm water filled blister. Bo redness or warmth on skin. NEUROLOGIC: Cranial nerves II through XII are intact. Muscle strength 5/5 in all extremities. Sensation intact. Gait not checked.  PSYCHIATRIC: The patient is alert and oriented x 3.  SKIN: on left leg as above.   Physical Exam LABORATORY PANEL:   CBC  Recent Labs Lab 09/28/15 1233  WBC 4.9  HGB 12.1  HCT 37.2  PLT 150   ------------------------------------------------------------------------------------------------------------------  Chemistries   Recent Labs Lab 10/03/15 0453  NA 135  K 3.9  CL 99*  CO2 27  GLUCOSE 121*  BUN 53*  CREATININE 1.61*  CALCIUM 9.6   ------------------------------------------------------------------------------------------------------------------  Cardiac Enzymes  Recent Labs Lab 09/28/15 1233  TROPONINI <0.03   ------------------------------------------------------------------------------------------------------------------  RADIOLOGY:  US Venous Img Lower Bilateral  10/02/2015  CLINICAL DATA:  Lower extremity swelling. EXAM: BILATERAL LOWER EXTREMITY VENOUS DOPPLER ULTRASOUND  TECHNIQUE: Gray-scale sonography with graded compression, as well as color Doppler and duplex  ultrasound were performed to evaluate the lower extremity deep venous systems from the level of the common femoral vein and including the common femoral, femoral, profunda femoral, popliteal and calf veins including the posterior tibial, peroneal and gastrocnemius veins when visible. The superficial great saphenous vein was also interrogated. Spectral Doppler was utilized to evaluate flow at rest and with distal augmentation maneuvers in the common femoral, femoral and popliteal veins. COMPARISON:  02/29/2012. FINDINGS: RIGHT LOWER EXTREMITY Common Femoral Vein: No evidence of thrombus. Normal compressibility, respiratory phasicity and response to augmentation. Saphenofemoral Junction: No evidence of thrombus. Normal compressibility and flow on color Doppler imaging. Profunda Femoral Vein: No evidence of thrombus. Normal compressibility and flow on color Doppler imaging. Femoral Vein: No evidence of thrombus. Normal compressibility, respiratory phasicity and response to augmentation. Popliteal Vein: No evidence of thrombus. Normal compressibility, respiratory phasicity and response to augmentation. Calf Veins: No evidence of thrombus. Normal compressibility and flow on color Doppler imaging. Superficial Great Saphenous Vein: No evidence of thrombus. Normal compressibility and flow on color Doppler imaging. Other Findings:  None. LEFT LOWER EXTREMITY Common Femoral Vein: No evidence of thrombus. Normal compressibility, respiratory phasicity and response to augmentation. Saphenofemoral Junction: No evidence of thrombus. Normal compressibility and flow on color Doppler imaging. Profunda Femoral Vein: No evidence of thrombus. Normal compressibility and flow on color Doppler imaging. Femoral Vein: No evidence of thrombus. Normal compressibility, respiratory phasicity and response to augmentation. Popliteal Vein: No evidence of thrombus. Normal compressibility, respiratory phasicity and response to augmentation. Calf  Veins: No evidence of thrombus. Normal compressibility and flow on color Doppler imaging. Superficial Great Saphenous Vein: No evidence of thrombus. Normal compressibility and flow on color Doppler imaging. Other Findings:  None. IMPRESSION: No evidence of deep venous thrombosis. Electronically Signed   By: Marcello Moores  Register   On: 10/02/2015 12:20    ASSESSMENT AND PLAN:   * Ac on ch systolic CHF   IV lasix, I/o monitor, Fluid restriction, Cardio consult appreciated.   Having diuresis, still significant edema.   Spoke to nurse for strict out put monitoring.   nephro tried albumin today.   As per cardio , there may be mainly lymphatic edema on legs.   Called Vascular consult and advised to keep pt in bed sleeping with legs at heart level.  * Ac renal failure   Likely due to cardiac failure- and decreased circulation.   IV lasix, monitor.   Nephrology on case.   Renal function improving gradually.   Monitor.  * Hyperkalemia   Now normal after lasix.  * Hypertension   Metoprolol, lasix  decreased metoprolol, as BP running on lower side.  * s//p valvular replacement   On coumadin, INR therapeutic, continue.   Pharmacy managing dosing.  * a fib   As per cardiologist, this may be playing a role in CHF, so may need better control per cardio.  added amio.   If not help much then she may need cardioversion.  * lymphedema   I spoke to Dr. Carmin Richmond for consult.  All the records are reviewed and case discussed with Care Management/Social Workerr. Management plans discussed with the patient, family and they are in agreement.  CODE STATUS: Full.  TOTAL TIME TAKING CARE OF THIS PATIENT: 35 minutes.    daughter present in room during my visit.  POSSIBLE D/C IN 2-3 DAYS, DEPENDING ON CLINICAL CONDITION.   [name] M.D on 10/03/2015  Between 7am to 6pm - Pager - [phone]  After 6pm go to [email or web address] - password [email or web address] [facility]  Office   [phone]  CC: Primary care physician; Rusty Aus, MD  Note: This dictation was prepared with Dragon dictation along with smaller phrase technology. Any transcriptional errors that result from this process are unintentional.

## 2015-10-03 NOTE — Progress Notes (Addendum)
Patient: [name] / Admit Date: 09/28/2015 / Date of Encounter: 10/03/2015, 11:19 AM   Subjective: Started on amiodarone 400 mg bid on 4/3 in preparation for possible DCCV later this week. No acute overnight events. Seen by vascular, felt like albumin is less likely to aid with LE edema. Pneumatic compression stocking ordered, not wearing given ulcer.     Review of Systems: Review of Systems  Constitutional: Positive for malaise/fatigue. Negative for fever, chills, weight loss and diaphoresis.  HENT: Negative for congestion.   Eyes: Negative for discharge and redness.  Respiratory: Negative for cough, hemoptysis, sputum production, shortness of breath and wheezing.   Cardiovascular: Positive for leg swelling. Negative for chest pain, palpitations, orthopnea, claudication and PND.  Gastrointestinal: Negative for nausea and vomiting.  Musculoskeletal: Positive for myalgias. Negative for falls.  Skin: Negative for rash.  Neurological: Negative for dizziness, tingling, tremors, sensory change, speech change, focal weakness, loss of consciousness and weakness.  Endo/Heme/Allergies: Does not bruise/bleed easily.  Psychiatric/Behavioral: The patient is not nervous/anxious.      Objective: Telemetry: Afib with RVR, 1-teens to 120's Physical Exam: Blood pressure 98/68, pulse 123, temperature 98.4 F (36.9 C), temperature source Oral, resp. rate 18, height 5' 8.5" (1.74 m), weight 181 lb 11.2 oz (82.419 kg), SpO2 94 %. Body mass index is 27.22 kg/(m^2). General: Well developed, well nourished, in no acute distress. Head: Normocephalic, atraumatic, sclera non-icteric, no xanthomas, nares are without discharge. Neck: Negative for carotid bruits. JVP not elevated. Lungs: Clear bilaterally to auscultation without wheezes, rales, or rhonchi. Breathing is unlabored. Heart: Tachycardic, irregularly irregular S1 S2, II/VI metallic murmur, no rubs, or gallops.  Abdomen: Soft, non-tender,  non-distended with normoactive bowel sounds. No rebound/guarding. Extremities: No clubbing or cyanosis. 3+ bilateral pitting edema with stasis dermatitis. Ulcer along inner left LE dressed. Neuro: Alert and oriented X 3. Moves all extremities spontaneously. Psych:  Responds to questions appropriately with a normal affect.   Intake/Output Summary (Last 24 hours) at 10/03/15 1119 Last data filed at 10/03/15 0959  Gross per 24 hour  Intake    480 ml  Output   1150 ml  Net   -670 ml    Inpatient Medications:  . amiodarone  400 mg Oral BID  . aspirin EC  81 mg Oral Daily  . furosemide  40 mg Intravenous BID  . metoprolol succinate  50 mg Oral Daily  . multivitamin with minerals  1 tablet Oral Daily  . pantoprazole  40 mg Oral Daily  . warfarin  5 mg Oral Once per day on Sun Mon Tue Thu Sat  . warfarin  7.5 mg Oral Once per day on Wed Fri  . Warfarin - Pharmacist Dosing Inpatient   Does not apply q1800   Infusions:    Labs:  Recent Labs  10/02/15 0506 10/03/15 0453  NA 134* 135  K 3.6 3.9  CL 98* 99*  CO2 28 27  GLUCOSE 111* 121*  BUN 55* 53*  CREATININE 1.75* 1.61*  CALCIUM 9.3 9.6    Recent Labs  10/02/15 0506  ALBUMIN 4.0   No results for input(s): WBC, NEUTROABS, HGB, HCT, MCV, PLT in the last 72 hours. No results for input(s): CKTOTAL, CKMB, TROPONINI in the last 72 hours. Invalid input(s): POCBNP No results for input(s): HGBA1C in the last 72 hours.   Weights: Filed Weights   10/01/15 0545 10/02/15 0454 10/03/15 0441  Weight: 183 lb 3.2 oz (83.099 kg) 182 lb 3.2 oz (82.645 kg)  181 lb 11.2 oz (82.419 kg)     Radiology/Studies:  Dg Chest 2 View  09/28/2015  CLINICAL DATA:  81 year old female with increased lower extremity swelling, shortness of breath for 1 day. Initial encounter. EXAM: CHEST  2 VIEW COMPARISON:  Chest radiographs 03/24/2012. FINDINGS: Larger lung volumes with resolved small pleural effusions present in 2013. Chronic cardiomegaly appears  stable. Sequelae of cardiac valve replacement and median sternotomy. Other mediastinal contours are within normal limits. Visualized tracheal air column is within normal limits. No pneumothorax. Pulmonary vascular congestion without overt edema. No confluent pulmonary opacity. Osteopenia with multilevel chronic appearing thoracic compression fractures. No acute osseous abnormality identified. IMPRESSION: Cardiomegaly and pulmonary vascular congestion without overt edema. Electronically Signed   By: Genevie Ann M.D.   On: 09/28/2015 13:13   US Venous Img Lower Bilateral  10/02/2015  CLINICAL DATA:  Lower extremity swelling. EXAM: BILATERAL LOWER EXTREMITY VENOUS DOPPLER ULTRASOUND TECHNIQUE: Gray-scale sonography with graded compression, as well as color Doppler and duplex ultrasound were performed to evaluate the lower extremity deep venous systems from the level of the common femoral vein and including the common femoral, femoral, profunda femoral, popliteal and calf veins including the posterior tibial, peroneal and gastrocnemius veins when visible. The superficial great saphenous vein was also interrogated. Spectral Doppler was utilized to evaluate flow at rest and with distal augmentation maneuvers in the common femoral, femoral and popliteal veins. COMPARISON:  02/29/2012. FINDINGS: RIGHT LOWER EXTREMITY Common Femoral Vein: No evidence of thrombus. Normal compressibility, respiratory phasicity and response to augmentation. Saphenofemoral Junction: No evidence of thrombus. Normal compressibility and flow on color Doppler imaging. Profunda Femoral Vein: No evidence of thrombus. Normal compressibility and flow on color Doppler imaging. Femoral Vein: No evidence of thrombus. Normal compressibility, respiratory phasicity and response to augmentation. Popliteal Vein: No evidence of thrombus. Normal compressibility, respiratory phasicity and response to augmentation. Calf Veins: No evidence of thrombus. Normal  compressibility and flow on color Doppler imaging. Superficial Great Saphenous Vein: No evidence of thrombus. Normal compressibility and flow on color Doppler imaging. Other Findings:  None. LEFT LOWER EXTREMITY Common Femoral Vein: No evidence of thrombus. Normal compressibility, respiratory phasicity and response to augmentation. Saphenofemoral Junction: No evidence of thrombus. Normal compressibility and flow on color Doppler imaging. Profunda Femoral Vein: No evidence of thrombus. Normal compressibility and flow on color Doppler imaging. Femoral Vein: No evidence of thrombus. Normal compressibility, respiratory phasicity and response to augmentation. Popliteal Vein: No evidence of thrombus. Normal compressibility, respiratory phasicity and response to augmentation. Calf Veins: No evidence of thrombus. Normal compressibility and flow on color Doppler imaging. Superficial Great Saphenous Vein: No evidence of thrombus. Normal compressibility and flow on color Doppler imaging. Other Findings:  None. IMPRESSION: No evidence of deep venous thrombosis. Electronically Signed   By: Marcello Moores  Register   On: 10/02/2015 12:20     Assessment and Plan   1. Persistent Afib with RVR: -Rate continues to be poorly controlled -Possibly chronic at this point -Would benefit from attempt at restoration of sinus rhythm  -Continue amiodarone 400 mg bid at this time with planned DCCV later this week in an effort to restore sinus rhythm  -On Coumadin   2. Acute on chronic combined CHF: -Recent discharge weight of 178-179, current weight of 181 on 4/4 -Has diuresed 2.6 L for the admission and 860 mL for the past 24 hours -Renal on board for help with diuresis given patient's CKD  -Has been started on Toprol XL, BP soft, may need to  hold -May need intermittent metolazone for added diuresis  3. Acute on CKD stage III: -Renal function improved today -Monitor closely with diuresis, though doubt she will require aggressive  diuresis  -Per renal -Spironolactone and losartan on hold given the above  4. Bilateral LE edema: -Multifactorial with CHF and lymphedema -Though felt to be more lymphedema based on RHC at [facility] -Continue diuresis and wraps/unaboots -Vascular on board  5. Mechanical mitral valve: -Possibly playing a role in #2 -Coumadin   Signed, Christell Faith, PA-C Pager: [phone] 10/03/2015, 11:19 AM     Attending Note Patient seen and examined, agree with detailed note above,  Patient presentation and plan discussed on rounds.   Patient seen this PM, Sitting in a chair, legs down Long discussion with patient and daughter, she has pain when legs are up in the chair as well as in bed. At home sleeps in a recliner but often legs are down. Tingling, restlessness when legs are up  Denies any significant shortness of breath, has not been ambulating very much Leg wraps are off, only a small bandage over a weeping wound on the left leg. Seen by vascular surgery, recommendation for 3 layer wrap She does have Velcro compressions at home but does not have them in the hospital  Echocardiogram reviewed and details discussed with her showing ejection fraction 45% which is unchanged from last year, intact mitral valve replacement, mildly elevated right heart pressures  On exam with clear lungs, heart rate irregular, periods of tachycardia, abdomen soft, pitting edema of the lower extremities  Lab work reviewed with her showing slow improvement of her renal function, INR stable greater than 2  -----> in summary, leg edema likely predominantly secondary to lymphedema, venous insufficiency, small component from acute on chronic systolic CHF. Recommended plan discussed with patient and daughter which includes: --Continue IV diuresis with close monitoring of renal function. This continues to improve daily suggesting cardiorenal component. If BUN and creatinine start to plateau and climb, would decrease  diuretic dosing to maintenance dose. -Recommended family bring in her Velcro compressions from home for her to use -Suggest prescription for hospital bed at home for leg elevation -Will follow vascular surgery's recommendation of triple layer wrap  --- In terms of her atrial fibrillation, heart rate is very difficult to control given low blood pressure  -Would continue amiodarone 400 mg twice a day, with anticoagulation Few other options given hypotension. Suspect if she has cardioversion at this time, it would not hold given short period of time on amiodarone. Ideally would consider waking longer period of time and perhaps cardiovert as an outpatient  -If she has symptomatic hypotension, may need to add midodrine  Greater than 50% was spent in counseling and coordination of care with patient Total encounter time 35 minutes or more   Signed: Esmond Plants M.D., Ph.D. [facility]

## 2015-10-03 NOTE — Progress Notes (Signed)
[facility] NAME: [name]    MR#:  [id number]  DATE OF BIRTH:  1934-12-26  SUBJECTIVE:  CHIEF COMPLAINT:   Chief Complaint  Patient presents with  . Shortness of Breath  . Leg Swelling   Came with severe leg swelling and weight gain, was admitted to [facility] last month- but did not have much diuresis there. Sent in by her PMD. Have good response to IV lasix. Still have significant edema on legs, was having una boots and compressive pumps on legs. Now have skin breakdown and blister on left leg. Some improvement on leg edema. Urine output is now properly measured, renal function improving.  REVIEW OF SYSTEMS:  CONSTITUTIONAL: No fever, fatigue or weakness.  EYES: No blurred or double vision.  EARS, NOSE, AND THROAT: No tinnitus or ear pain.  RESPIRATORY: No cough, shortness of breath, wheezing or hemoptysis.  CARDIOVASCULAR: No chest pain, orthopnea,positive for severe edema.  GASTROINTESTINAL: No nausea, vomiting, diarrhea or abdominal pain.  GENITOURINARY: No dysuria, hematuria.  ENDOCRINE: No polyuria, nocturia,  HEMATOLOGY: No anemia, easy bruising or bleeding SKIN: No rash or lesion. MUSCULOSKELETAL: No joint pain or arthritis.   NEUROLOGIC: No tingling, numbness, weakness.  PSYCHIATRY: No anxiety or depression.   ROS  DRUG ALLERGIES:   Allergies  Allergen Reactions  . Ace Inhibitors     Other reaction(s): Cough  . Hydrocodone Itching and Nausea Only  . Mercury     Other reaction(s): Unknown  . Nickel   . Silver Dermatitis    Severe itching  . Cephalexin Rash  . Penicillins Rash    VITALS:  Blood pressure 96/72, pulse 84, temperature 98.4 F (36.9 C), temperature source Oral, resp. rate 18, height 5' 8.5" (1.74 m), weight 82.419 kg (181 lb 11.2 oz), SpO2 94 %.  PHYSICAL EXAMINATION:  GENERAL:  81 y.o.-year-old patient lying in the bed with no acute distress.  EYES: Pupils equal, round,  reactive to light and accommodation. No scleral icterus. Extraocular muscles intact.  HEENT: Head atraumatic, normocephalic. Oropharynx and nasopharynx clear.  NECK:  Supple, no jugular venous distention. No thyroid enlargement, no tenderness.  LUNGS: Normal breath sounds bilaterally, no wheezing, rales,rhonchi or crepitation. No use of accessory muscles of respiration.  CARDIOVASCULAR: S1, S2 normal. Positive for murmurs, severe b/l leg and thigh edema ABDOMEN: Soft, nontender, nondistended. Bowel sounds present. No organomegaly or mass.  EXTREMITIES: severe pedal edema, no cyanosis, or clubbing. On left leg- skin breakdown with a large 5 cm water filled blister. Bo redness or warmth on skin. NEUROLOGIC: Cranial nerves II through XII are intact. Muscle strength 5/5 in all extremities. Sensation intact. Gait not checked.  PSYCHIATRIC: The patient is alert and oriented x 3.  SKIN: on left leg as above.   Physical Exam LABORATORY PANEL:   CBC  Recent Labs Lab 09/28/15 1233  WBC 4.9  HGB 12.1  HCT 37.2  PLT 150   ------------------------------------------------------------------------------------------------------------------  Chemistries   Recent Labs Lab 10/03/15 0453  NA 135  K 3.9  CL 99*  CO2 27  GLUCOSE 121*  BUN 53*  CREATININE 1.61*  CALCIUM 9.6   ------------------------------------------------------------------------------------------------------------------  Cardiac Enzymes  Recent Labs Lab 09/28/15 1233  TROPONINI <0.03   ------------------------------------------------------------------------------------------------------------------  RADIOLOGY:  US Venous Img Lower Bilateral  10/02/2015  CLINICAL DATA:  Lower extremity swelling. EXAM: BILATERAL LOWER EXTREMITY VENOUS DOPPLER ULTRASOUND TECHNIQUE: Gray-scale sonography with graded compression, as well as color Doppler and duplex ultrasound were performed  to evaluate the lower extremity deep venous systems  from the level of the common femoral vein and including the common femoral, femoral, profunda femoral, popliteal and calf veins including the posterior tibial, peroneal and gastrocnemius veins when visible. The superficial great saphenous vein was also interrogated. Spectral Doppler was utilized to evaluate flow at rest and with distal augmentation maneuvers in the common femoral, femoral and popliteal veins. COMPARISON:  02/29/2012. FINDINGS: RIGHT LOWER EXTREMITY Common Femoral Vein: No evidence of thrombus. Normal compressibility, respiratory phasicity and response to augmentation. Saphenofemoral Junction: No evidence of thrombus. Normal compressibility and flow on color Doppler imaging. Profunda Femoral Vein: No evidence of thrombus. Normal compressibility and flow on color Doppler imaging. Femoral Vein: No evidence of thrombus. Normal compressibility, respiratory phasicity and response to augmentation. Popliteal Vein: No evidence of thrombus. Normal compressibility, respiratory phasicity and response to augmentation. Calf Veins: No evidence of thrombus. Normal compressibility and flow on color Doppler imaging. Superficial Great Saphenous Vein: No evidence of thrombus. Normal compressibility and flow on color Doppler imaging. Other Findings:  None. LEFT LOWER EXTREMITY Common Femoral Vein: No evidence of thrombus. Normal compressibility, respiratory phasicity and response to augmentation. Saphenofemoral Junction: No evidence of thrombus. Normal compressibility and flow on color Doppler imaging. Profunda Femoral Vein: No evidence of thrombus. Normal compressibility and flow on color Doppler imaging. Femoral Vein: No evidence of thrombus. Normal compressibility, respiratory phasicity and response to augmentation. Popliteal Vein: No evidence of thrombus. Normal compressibility, respiratory phasicity and response to augmentation. Calf Veins: No evidence of thrombus. Normal compressibility and flow on color Doppler  imaging. Superficial Great Saphenous Vein: No evidence of thrombus. Normal compressibility and flow on color Doppler imaging. Other Findings:  None. IMPRESSION: No evidence of deep venous thrombosis. Electronically Signed   By: Marcello Moores  Register   On: 10/02/2015 12:20    ASSESSMENT AND PLAN:   * Ac on ch systolic CHF   IV lasix, I/o monitor, Fluid restriction, Cardio consult appreciated.   Having diuresis, still significant edema.   Spoke to nurse for strict out put monitoring.   nephro tried albumin today.   As per cardio , there may be mainly lymphatic edema on legs.  * Ac renal failure   Likely due to cardiac failure- and decreased circulation.   IV lasix, monitor.   Nephrology on case.   Renal functio improving gradually.  * Hyperkalemia   Now normal after lasix.  * Hypertension   Metoprolol, lasix  decreased metoprolol, as BP running on lower side.  * s//p valvular replacement   On coumadin, INR therapeutic, continue.   Pharmacy managing dosing.  * a fib   As per cardiologist, this may be playing a role in CHF, so may need better control per cardio.   Plan is to add amio.   All the records are reviewed and case discussed with Care Management/Social Workerr. Management plans discussed with the patient, family and they are in agreement.  CODE STATUS: Full.  TOTAL TIME TAKING CARE OF THIS PATIENT: 35 minutes.   son and daughter present in room during my visit.  POSSIBLE D/C IN 2-3 DAYS, DEPENDING ON CLINICAL CONDITION.   [name] M.D on 10/03/2015   Between 7am to 6pm - Pager - [phone]  After 6pm go to [email or web address] - password [email or web address] [facility]  Office  [phone]  CC: Primary care physician; Rusty Aus, MD  Note: This dictation was prepared with Dragon dictation along with smaller phrase technology. Any transcriptional errors  that result from this process are unintentional.

## 2015-10-03 NOTE — Progress Notes (Signed)
[facility]  ROUNDING NOTE   Subjective:   Sitting in chair.States that clinical status is about the same as yesterday Urine output recorded at 1100 cc Serum creatinine 1.61  Objective:  Vital signs in last 24 hours:  Temp:  [98.3 F (36.8 C)-98.4 F (36.9 C)] 98.4 F (36.9 C) (04/04 0441) Pulse Rate:  [84-134] 123 (04/04 1030) Resp:  [18] 18 (04/04 0441) BP: (96-100)/(52-72) 98/68 mmHg (04/04 1030) SpO2:  [92 %-94 %] 94 % (04/04 0441) Weight:  [82.419 kg (181 lb 11.2 oz)] 82.419 kg (181 lb 11.2 oz) (04/04 0441)  Weight change: -0.227 kg (-8 oz) Filed Weights   10/01/15 0545 10/02/15 0454 10/03/15 0441  Weight: 83.099 kg (183 lb 3.2 oz) 82.645 kg (182 lb 3.2 oz) 82.419 kg (181 lb 11.2 oz)    Intake/Output: I/O last 3 completed shifts: In: 240 [P.O.:240] Out: 1250 [Urine:1250]   Intake/Output this shift:  Total I/O In: 480 [P.O.:480] Out: 400 [Urine:400]  Physical Exam: General: NAD, sitting in chair  Head: Normocephalic, atraumatic. Moist oral mucosal membranes  Eyes: Anicteric,   Neck: Supple, trachea midline  Lungs:  Clear bilaterally  Heart: Irregular, A Fib  Abdomen:  Soft, nontender,   Extremities:  ++ peripheral edema upto knees. Edema blister on left leg  Neurologic: Nonfocal, moving all four extremities  Skin: No acute lesions       Basic Metabolic Panel:  Recent Labs Lab 09/28/15 1233 09/29/15 0442 09/29/15 1446 09/30/15 0552 10/01/15 0557 10/02/15 0506 10/03/15 0453  NA 133*  --   --  136 135 134* 135  K 5.2*  --  4.3 4.5 4.1 3.6 3.9  CL 101  --   --  101 100* 98* 99*  CO2 23  --   --  28 26 28 27   GLUCOSE 94  --   --  109* 113* 111* 121*  BUN 84*  --   --  75* 63* 55* 53*  CREATININE 2.29* 2.14*  --  1.89* 1.66* 1.75* 1.61*  CALCIUM 9.5  --   --  9.8 9.6 9.3 9.6    Liver Function Tests:  Recent Labs Lab 10/02/15 0506  ALBUMIN 4.0   No results for input(s): LIPASE, AMYLASE in the last 168 hours. No results for  input(s): AMMONIA in the last 168 hours.  CBC:  Recent Labs Lab 09/28/15 1233  WBC 4.9  HGB 12.1  HCT 37.2  MCV 81.5  PLT 150    Cardiac Enzymes:  Recent Labs Lab 09/28/15 1233  TROPONINI <0.03    BNP: Invalid input(s): POCBNP  CBG:  Recent Labs Lab 10/02/15 0752  GLUCAP 110*    Microbiology: Results for orders placed or performed in visit on 03/22/12  Urine culture     Status: None   Collection Time: 03/22/12 11:51 AM  Result Value Ref Range Status   Micro Text Report   Final       SOURCE: CC    COMMENT                   MIXED BACTERIAL ORGANISMS   COMMENT                   RESULTS SUGGESTIVE OF CONTAMINATION   ANTIBIOTIC  Culture, blood (single)     Status: None   Collection Time: 03/22/12 12:03 PM  Result Value Ref Range Status   Micro Text Report   Final       COMMENT                   NO GROWTH AEROBICALLY/ANAEROBICALLY IN 5 DAYS   ANTIBIOTIC                                                      Culture, blood (single)     Status: None   Collection Time: 03/22/12 12:08 PM  Result Value Ref Range Status   Micro Text Report   Final       COMMENT                   NO GROWTH AEROBICALLY/ANAEROBICALLY IN 5 DAYS   ANTIBIOTIC                                                      Culture, blood (single)     Status: None   Collection Time: 03/24/12 11:09 AM  Result Value Ref Range Status   Micro Text Report   Final       COMMENT                   NO GROWTH AEROBICALLY/ANAEROBICALLY IN 5 DAYS   ANTIBIOTIC                                                        Coagulation Studies:  Recent Labs  10/01/15 0557 10/02/15 0506 10/03/15 0453  LABPROT 31.6* 28.3* 27.3*  INR 3.13 2.70 2.58    Urinalysis: No results for input(s): COLORURINE, LABSPEC, PHURINE, GLUCOSEU, HGBUR, BILIRUBINUR, KETONESUR, PROTEINUR, UROBILINOGEN, NITRITE, LEUKOCYTESUR in the last 72 hours.  Invalid input(s):  APPERANCEUR    Imaging: US Venous Img Lower Bilateral  10/02/2015  CLINICAL DATA:  Lower extremity swelling. EXAM: BILATERAL LOWER EXTREMITY VENOUS DOPPLER ULTRASOUND TECHNIQUE: Gray-scale sonography with graded compression, as well as color Doppler and duplex ultrasound were performed to evaluate the lower extremity deep venous systems from the level of the common femoral vein and including the common femoral, femoral, profunda femoral, popliteal and calf veins including the posterior tibial, peroneal and gastrocnemius veins when visible. The superficial great saphenous vein was also interrogated. Spectral Doppler was utilized to evaluate flow at rest and with distal augmentation maneuvers in the common femoral, femoral and popliteal veins. COMPARISON:  02/29/2012. FINDINGS: RIGHT LOWER EXTREMITY Common Femoral Vein: No evidence of thrombus. Normal compressibility, respiratory phasicity and response to augmentation. Saphenofemoral Junction: No evidence of thrombus. Normal compressibility and flow on color Doppler imaging. Profunda Femoral Vein: No evidence of thrombus. Normal compressibility and flow on color Doppler imaging. Femoral Vein: No evidence of thrombus. Normal compressibility, respiratory phasicity and response to augmentation. Popliteal Vein: No evidence of thrombus. Normal compressibility, respiratory phasicity and response to augmentation. Calf Veins: No evidence of thrombus. Normal compressibility  and flow on color Doppler imaging. Superficial Great Saphenous Vein: No evidence of thrombus. Normal compressibility and flow on color Doppler imaging. Other Findings:  None. LEFT LOWER EXTREMITY Common Femoral Vein: No evidence of thrombus. Normal compressibility, respiratory phasicity and response to augmentation. Saphenofemoral Junction: No evidence of thrombus. Normal compressibility and flow on color Doppler imaging. Profunda Femoral Vein: No evidence of thrombus. Normal compressibility and flow on  color Doppler imaging. Femoral Vein: No evidence of thrombus. Normal compressibility, respiratory phasicity and response to augmentation. Popliteal Vein: No evidence of thrombus. Normal compressibility, respiratory phasicity and response to augmentation. Calf Veins: No evidence of thrombus. Normal compressibility and flow on color Doppler imaging. Superficial Great Saphenous Vein: No evidence of thrombus. Normal compressibility and flow on color Doppler imaging. Other Findings:  None. IMPRESSION: No evidence of deep venous thrombosis. Electronically Signed   By: Marcello Moores  Register   On: 10/02/2015 12:20     Medications:     . amiodarone  400 mg Oral BID  . aspirin EC  81 mg Oral Daily  . furosemide  40 mg Intravenous BID  . metoprolol succinate  50 mg Oral Daily  . multivitamin with minerals  1 tablet Oral Daily  . pantoprazole  40 mg Oral Daily  . warfarin  5 mg Oral Once per day on Sun Mon Tue Thu Sat  . warfarin  7.5 mg Oral Once per day on Wed Fri  . Warfarin - Pharmacist Dosing Inpatient   Does not apply q1800   acetaminophen, traMADol, vitamin C  Assessment/ Plan:  [name] is a 81 y.o. white female with hypertension, systolic and diastolic congestive heart failure, mitral valve replacement 09/05/15, hyperlipidemia, atrial fibrillation, anemia, who was admitted to [facility] on 09/28/2015 for Acute on chronic congestive heart failure, unspecified congestive heart failure type ([place]) [I50.9]   1. Acute Renal Failure with hyperkalemia on chronic kidney disease stage III: baseline creatinine of 1.09 eGFR of 48 04/30/15   Likely ARF from acute cardiorenal syndrome - Agree with IV furosemide. More likely to change furosemide from torsemide outpatient. IV furosemide for now - hold spironolactone due to renal insufficiency and hyperkalemia. Status post kayexalate on admission - hold losartan. (allergy to ACE-I) - albumin level is normal - UOP 1100 cc. S Cr improving slowly  2. LE edema  with acute exacerbation of systolic and diastolic congestive heart failure with new mitral valve replacement - blood pressure low normal - IV furosemide 40 BID - LV EF: 40% - 45%; Systolic pressure was mildly elevated. PA  peak pressure: 43 mm Hg   3. Hyponatremia: due to hypervolemia.  Now resolved   4. A Fib - Amiodarone in anticipation of Cardioversion later this week   LOS: 5 [name] 4/4/201711:44 AM

## 2015-10-03 NOTE — Care Management (Signed)
Being followed by nephrology, cardiology, surgery, and attending.  Vascular to consult- appears has been followed by Dr Delana Meyer as outpatient.  Patient with worsening edema lower extremities and ulcers on leg.  Awaiting LE pumps.  wounds are progressing since jan 2017 and treatment of weekly wrapping has not been successful.  Patient has limited movement due to pain in legs.

## 2015-10-03 NOTE — Progress Notes (Signed)
ANTICOAGULATION CONSULT NOTE - FOLLOW UP   Pharmacy Consult for Warfarin Indication: Chronic Afib with Mitral Valve Replacement  Allergies  Allergen Reactions  . Ace Inhibitors     Other reaction(s): Cough  . Hydrocodone Itching and Nausea Only  . Mercury     Other reaction(s): Unknown  . Nickel   . Silver Dermatitis    Severe itching  . Cephalexin Rash  . Penicillins Rash    Patient Measurements: Height: 5' 8.5" (174 cm) Weight: 181 lb 11.2 oz (82.419 kg) IBW/kg (Calculated) : 65.05 Heparin Dosing Weight:   Vital Signs: Temp: 98.4 F (36.9 C) (04/04 0441) Temp Source: Oral (04/04 0441) BP: 98/68 mmHg (04/04 1030) Pulse Rate: 123 (04/04 1030)  Labs:  Recent Labs  10/01/15 0557 10/02/15 0506 10/03/15 0453  LABPROT 31.6* 28.3* 27.3*  INR 3.13 2.70 2.58  CREATININE 1.66* 1.75* 1.61*    Estimated Creatinine Clearance: 32.7 mL/min (by C-G formula based on Cr of 1.61).   Medical History: Past Medical History  Diagnosis Date  . Shortness of breath dyspnea   . Hypertension   . Chronic combined systolic (congestive) and diastolic (congestive) heart failure (HCC)     a. EF 25% by echo in 08/2015 b. RHC in 08/2015 showed normal filling pressures  . GERD (gastroesophageal reflux disease)   . Arthritis   . Chronic atrial fibrillation ([place])     a. On Coumadin  . S/P MVR (mitral valve replacement)     a. MVR 1994 b. redo MVR in 08/2014 - on Coumadin    Medications:  Scheduled:  . amiodarone  400 mg Oral BID  . aspirin EC  81 mg Oral Daily  . furosemide  40 mg Intravenous BID  . metoprolol succinate  50 mg Oral Daily  . multivitamin with minerals  1 tablet Oral Daily  . pantoprazole  40 mg Oral Daily  . warfarin  5 mg Oral Once per day on Sun Mon Tue Thu Sat  . warfarin  7.5 mg Oral Once per day on Wed Fri  . Warfarin - Pharmacist Dosing Inpatient   Does not apply q1800    Assessment: 81 yo admitted with SOB, pulmonary edema, CHF.  Hx chronic atrial  fibrillation on Coumadin, mitral valve replacement. Warfarin dose at home per PTA meds:  5 mg Mon, Tues, Thur, Sat,Sun  And 7.5mg   Wed, Fri.  3/30  INR 2.79 3/31 INR 3.13 4/1:  INR: 3.17 4/2: INR: 3.13 4/3: INR: 2.70 4/4: INR: 2.58  Goal of Therapy:  INR 2.5-3.5 Monitor platelets by anticoagulation protocol: Yes   Plan:  INR is still within therapeutic range for patient but trending down. Patient was started on Amiodarone 400mg  PO bid last evening. This will likely cause an increase in INR. Will need to monitor INR closely for next several days. Will continue with today's dose of warfarin 5mg . Will follow up on daily INR labs.  Paulina Fusi, PharmD, BCPS 10/03/2015 11:32 AM

## 2015-10-03 NOTE — Consult Note (Signed)
[facility] Vascular Consult Note  MRN : [id number]  [name] is a 81 y.o. (11-May-1935) female who presents with chief complaint of  Chief Complaint  Patient presents with  . Shortness of Breath  . Leg Swelling  .  History of Present Illness: I am asked to see the patient by both Dr. Bary Castilla as well as Dr. Anselm Jungling for evaluation of her lower extremity swelling. The patient is a 81 y.o. female with a known history of chronic systolic congestive heart failure with ejection fraction 25%, chronic atrial fibrillation on Coumadin, mitral valve replacement and multiple other medical problems went to see her primary care physician Dr. Sabra Heck for worsening of shortness of breath, lower extent is swelling and 29 pounds weight gain in the past 2 weeks. The patient was admitted to [facility] 1 month ago with similar symptoms and she is to cardiology as an outpatient. Denies any chest pain here. Chest x-ray has revealed pulmonary edema. Patient was given IV Lasix 40 mg and hospitalist team is called to admit the patient.  Current Facility-Administered Medications  Medication Dose Route Frequency Provider Last Rate Last Dose  . acetaminophen (TYLENOL) tablet 500 mg  500 mg Oral Q6H PRN Nicholes Mango, MD   500 mg at 10/03/15 1043  . amiodarone (PACERONE) tablet 400 mg  400 mg Oral BID Wellington Hampshire, MD   400 mg at 10/03/15 1024  . aspirin EC tablet 81 mg  81 mg Oral Daily Nicholes Mango, MD   81 mg at 10/03/15 1024  . furosemide (LASIX) injection 40 mg  40 mg Intravenous BID Nicholes Mango, MD   40 mg at 10/03/15 1811  . metoprolol succinate (TOPROL-XL) 24 hr tablet 50 mg  50 mg Oral Daily [name], MD   50 mg at 10/02/15 1008  . multivitamin with minerals tablet 1 tablet  1 tablet Oral Daily Nicholes Mango, MD   1 tablet at 10/03/15 1024  . pantoprazole (PROTONIX) EC tablet 40 mg  40 mg Oral Daily Nicholes Mango, MD   40 mg at 10/03/15 1811  . traMADol  (ULTRAM) tablet 50 mg  50 mg Oral Q6H PRN Nicholes Mango, MD   50 mg at 10/02/15 2215  . vitamin C (ASCORBIC ACID) tablet 500 mg  500 mg Oral Daily PRN Nicholes Mango, MD      . warfarin (COUMADIN) tablet 5 mg  5 mg Oral Once per day on Sun Mon Tue Thu Sat Nicholes Mango, MD   5 mg at 10/03/15 1812  . warfarin (COUMADIN) tablet 7.5 mg  7.5 mg Oral Once per day on Wed Fri Aruna Gouru, MD   7.5 mg at 09/29/15 1813  . Warfarin - Pharmacist Dosing Inpatient   Does not apply [id number] Nicholes Mango, MD        Past Medical History  Diagnosis Date  . Shortness of breath dyspnea   . Hypertension   . Chronic combined systolic (congestive) and diastolic (congestive) heart failure (HCC)     a. EF 25% by echo in 08/2015 b. RHC in 08/2015 showed normal filling pressures  . GERD (gastroesophageal reflux disease)   . Arthritis   . Chronic atrial fibrillation ([place])     a. On Coumadin  . S/P MVR (mitral valve replacement)     a. MVR 1994 b. redo MVR in 08/2014 - on Coumadin    Past Surgical History  Procedure Laterality Date  . Cardiac catheterization    .  Pyloric stenosis    . Cataract extraction w/phaco Right 02/20/2015    Procedure: CATARACT EXTRACTION PHACO AND INTRAOCULAR LENS PLACEMENT (IOC);  Surgeon: Estill Cotta, MD;  Location: [facility] ORS;  Service: Ophthalmology;  Laterality: Right;  US01:31.2 AP 25.3% CDE40.13 Fluid lot # [id number]  . Breast surgery Right 2005?    benign lump excision  . Cardiac valve replacement  1994, 2016  . Irrigation and debridement hematoma Left 07/05/2015    Procedure: IRRIGATION AND DEBRIDEMENT HEMATOMA;  Surgeon: Robert Bellow, MD;  Location: [facility] ORS;  Service: General;  Laterality: Left;  . US echocardiography      Social History Social History  Substance Use Topics  . Smoking status: Former Research scientist (life sciences)  . Smokeless tobacco: Never Used  . Alcohol Use: No     Comment: occasional    Family History Family History  Problem Relation Age of Onset  . Stroke Mother   .  Renal Disease Father     Allergies  Allergen Reactions  . Ace Inhibitors     Other reaction(s): Cough  . Hydrocodone Itching and Nausea Only  . Mercury     Other reaction(s): Unknown  . Nickel   . Silver Dermatitis    Severe itching  . Cephalexin Rash  . Penicillins Rash     REVIEW OF SYSTEMS (Negative unless checked)  Constitutional: [] Weight loss  [] Fever  [] Chills Cardiac: [] Chest pain   [] Chest pressure   [] Palpitations   [] Shortness of breath when laying flat   [] Shortness of breath at rest   [] Shortness of breath with exertion. Vascular:  [] Pain in legs with walking   [x] Pain in legs at rest   [x] Pain in legs when laying flat   [] Claudication   [] Pain in feet when walking  [] Pain in feet at rest  [] Pain in feet when laying flat   [] History of DVT   [] Phlebitis   [x] Swelling in legs   [] Varicose veins   [] Non-healing ulcers Pulmonary:   [] Uses home oxygen   [] Productive cough   [] Hemoptysis   [] Wheeze  [] COPD   [] Asthma Neurologic:  [] Dizziness  [] Blackouts   [] Seizures   [] History of stroke   [] History of TIA  [] Aphasia   [] Temporary blindness   [] Dysphagia   [] Weakness or numbness in arms   [] Weakness or numbness in legs Musculoskeletal:  [] Arthritis   [] Joint swelling   [] Joint pain   [] Low back pain Hematologic:  [] Easy bruising  [] Easy bleeding   [] Hypercoagulable state   [] Anemic  [] Hepatitis Gastrointestinal:  [] Blood in stool   [] Vomiting blood  [] Gastroesophageal reflux/heartburn   [] Difficulty swallowing. Genitourinary:  [] Chronic kidney disease   [] Difficult urination  [] Frequent urination  [] Burning with urination   [] Blood in urine Skin:  [x] Rashes   [x] Ulcers   [x] Wounds Psychological:  [] History of anxiety   []  History of major depression.   Physical Examination  Filed Vitals:   10/02/15 1957 10/03/15 0441 10/03/15 1030 10/03/15 1459  BP: 100/52 96/72 98/68    Pulse: 134 84 123 91  Temp: 98.3 F (36.8 C) 98.4 F (36.9 C)    TempSrc: Oral Oral    Resp: 18  18    Height:      Weight:  82.419 kg (181 lb 11.2 oz)    SpO2: 92% 94%  96%   Body mass index is 27.22 kg/(m^2).  Head: [place]/AT, No temporalis wasting. Prominent temp pulse not noted. Ear/Nose/Throat: Nares w/o erythema or drainage, oropharynx w/o obstruction. Eyes: PERRLA, Sclera nonicteric.  Neck: Supple,  no nuchal rigidity.  No bruit or JVD.  Pulmonary:  Breath sounds equal bilaterally, no use of accessory muscles.  Cardiac: RRR, normal S1, S2, no Murmurs, rubs or gallops. Vascular: Pedal pulses are nonpalpable. There is massive 4+ edema bilaterally there is an excoriated area 4 cm circular in appearance which is denuded of epithelium but otherwise superficial there is a smaller associated area that appears to be full-thickness skin loss measuring approximately 1-1/2 cm circular. These ulcers are noted on the anterior medial portion of her calf on the left. Gastrointestinal: soft, non-tender, non-distended.  Musculoskeletal: Moves all extremities.  No deformity or atrophy. No edema. Neurologic: CN 2-12 intact. Symmetrical.  Speech is fluent.  Psychiatric: Judgment intact, Mood & affect appropriate for pt's clinical situation. Dermatologic: No rashes or ulcers noted of the upper extremities.  No cellulitis.  Lymph : No Cervical,  or Inguinal lymphadenopathy.      CBC Lab Results  Component Value Date   WBC 4.9 09/28/2015   HGB 12.1 09/28/2015   HCT 37.2 09/28/2015   MCV 81.5 09/28/2015   PLT 150 09/28/2015    BMET    Component Value Date/Time   NA 135 10/03/2015 0453   NA 143 03/27/2012 0418   K 3.9 10/03/2015 0453   K 3.9 02/20/2015 0630   K 4.4 03/27/2012 0418   CL 99* 10/03/2015 0453   CL 111* 03/27/2012 0418   CO2 27 10/03/2015 0453   CO2 21 03/27/2012 0418   GLUCOSE 121* 10/03/2015 0453   GLUCOSE 96 03/27/2012 0418   BUN 53* 10/03/2015 0453   BUN 10 03/27/2012 0418   CREATININE 1.61* 10/03/2015 0453   CREATININE 0.84 03/27/2012 0418   CALCIUM 9.6 10/03/2015  0453   CALCIUM 8.3* 03/27/2012 0418   GFRNONAA 30* 10/03/2015 0453   GFRNONAA >60 03/27/2012 0418   GFRAA 34* 10/03/2015 0453   GFRAA >60 03/27/2012 0418   Estimated Creatinine Clearance: 32.7 mL/min (by C-G formula based on Cr of 1.61).  COAG Lab Results  Component Value Date   INR 2.58 10/03/2015   INR 2.70 10/02/2015   INR 3.13 10/01/2015    Assessment/Plan #1 lymphedema  is an underlying associated conditions the patient has severe lymphedema she has been seen in my office before I believe this is associated with moderate venous insufficiency as well. I have discussed in the past the primary modalities for treatment for her edema including the absolute necessity for frequent elevation, compression therapy and ultimately a Lymphapress. We are in the process of obtaining a Lymphapress she does not appear to be wearing compression as she states they are painful and she does not stay in bed as she claims being in bed is painful as well. Her admission and with her son in attendance and concurring she essentially sits up in a chair continuously. I believe that this is the primary factor in the [employer] out-of-control edema with which she is struggling and this has resulted in the ulceration. At this time she is blaming the [place] boot and refusing further therapy perhaps 3 layer wraps can be obtained from the wound care center the left leg will need to be changed likely on a daily basis given the amount of drainage however the right leg may be reasonably well controlled on a weekly basis as there are no open wounds or sores at this time. I had a lengthy discussion with the patient and her son total time spent in evaluation discussion with the patient and care  is 65 minutes  #2 acute kidney injury on chronic kidney disease Baseline creatinine is 1.09 and BUN at 43 in October 2016 which is a 2.29 today with BUN at 84 Will hold off on Cozaar and Demadex from worsening of renal function as well as  hypotension Nephrology consult is placed Will monitor renal function closely while patient is on IV Lasix  #3acute respiratory distress secondary to acute exacerbation of systolic congestive heart failure Admit patient to telemetry Provide IV Lasix with close monitoring of intake and output and daily weights Holding her home medication spironolactone in view of hyperkalemia and soft blood pressure We will get an echocardiogram and cardiac consult is placed to Dr. Rockey Situ as requested by family Renal function needs to be monitored closely  #4 chronic atrial fibrillation and history of mechanical mitral valve replacement-rate controlled Check PT/INR and Coumadin management per pharmacy is done the PT/INR results  #5 recent history of left lower extremity hematoma evacuation by Dr. Charissa Bash Continue wound care   #6 essential hypertension-currently blood pressure is soft Hold Cozaar, spironolactone and Demadex Continue metoprolol with holding parameters   Lawyer Washabaugh, Dolores Lory, MD  10/03/2015 6:35 PM

## 2015-10-04 ENCOUNTER — Encounter: Payer: Self-pay | Admitting: Dietician

## 2015-10-04 LAB — BASIC METABOLIC PANEL
Anion gap: 6 (ref 5–15)
BUN: 47 mg/dL — AB (ref 6–20)
CALCIUM: 9.2 mg/dL (ref 8.9–10.3)
CO2: 27 mmol/L (ref 22–32)
Chloride: 102 mmol/L (ref 101–111)
Creatinine, Ser: 1.57 mg/dL — ABNORMAL HIGH (ref 0.44–1.00)
GFR calc Af Amer: 35 mL/min — ABNORMAL LOW (ref >60)
GFR calc non Af Amer: 30 mL/min — ABNORMAL LOW (ref >60)
GLUCOSE: 115 mg/dL — AB (ref 65–99)
Potassium: 3.7 mmol/L (ref 3.5–5.1)
Sodium: 135 mmol/L (ref 135–145)

## 2015-10-04 LAB — PROTIME-INR
INR: 2.5
Prothrombin Time: 26.7 seconds — ABNORMAL HIGH (ref 11.4–15.0)

## 2015-10-04 MED ORDER — METOPROLOL SUCCINATE ER 25 MG PO TB24
25.0000 mg | ORAL_TABLET | Freq: Once | ORAL | Status: AC
  Administered 2015-10-04: 25 mg via ORAL

## 2015-10-04 NOTE — Progress Notes (Signed)
Patient and daughter concerned about pain management while patient sleeping in bed. Offered to notify on call MD to get medications changed. Patient and daughter declined. Requested information be passed on to primary doctor. Will continue to monitor.

## 2015-10-04 NOTE — Progress Notes (Signed)
PT Cancellation Note  Patient Details Name: [name] MRN: [id number] DOB: 1934/11/28   Cancelled Treatment:    Reason Eval/Treat Not Completed: Other (comment) (Per discussion with PTA previous day, patient currently mod indep/indep wtih all functional activities; all goals set on initial evaluation complete.  No further acute PT needs identified at this time.  Will complete initial order; please re-consult should needs change.)   Alla Sloma H. Owens Shark, PT, DPT, NCS 10/04/2015, 6:45 PM [phone]

## 2015-10-04 NOTE — Progress Notes (Signed)
[facility]  ROUNDING NOTE   Subjective:   Sitting in chair.States that her led edema is slightly better Urine output recorded at 1400 cc Serum creatinine 1.61->1.57  Objective:  Vital signs in last 24 hours:  Temp:  [97.8 F (36.6 C)-97.9 F (36.6 C)] 97.9 F (36.6 C) (04/05 0609) Pulse Rate:  [32-123] 32 (04/05 0609) Resp:  [20] 20 (04/05 0609) BP: (85-109)/(52-86) 85/52 mmHg (04/05 0609) SpO2:  [93 %-96 %] 95 % (04/05 0609) Weight:  [84.777 kg (186 lb 14.4 oz)] 84.777 kg (186 lb 14.4 oz) (04/05 0609)  Weight change: 2.359 kg (5 lb 3.2 oz) Filed Weights   10/02/15 0454 10/03/15 0441 10/04/15 0609  Weight: 82.645 kg (182 lb 3.2 oz) 82.419 kg (181 lb 11.2 oz) 84.777 kg (186 lb 14.4 oz)    Intake/Output: I/O last 3 completed shifts: In: 720 [P.O.:720] Out: 1700 [Urine:1700]   Intake/Output this shift:     Physical Exam: General: NAD, sitting in chair  Head: Normocephalic, atraumatic. Moist oral mucosal membranes  Eyes: Anicteric,   Neck: Supple, trachea midline  Lungs:  Clear bilaterally  Heart: Irregular, A Fib  Abdomen:  Soft, nontender,   Extremities:  ++ peripheral edema upto knees. Edema blister on left leg  Neurologic: Nonfocal, moving all four extremities  Skin: No acute lesions       Basic Metabolic Panel:  Recent Labs Lab 09/30/15 0552 10/01/15 0557 10/02/15 0506 10/03/15 0453 10/04/15 0502  NA 136 135 134* 135 135  K 4.5 4.1 3.6 3.9 3.7  CL 101 100* 98* 99* 102  CO2 28 26 28 27 27   GLUCOSE 109* 113* 111* 121* 115*  BUN 75* 63* 55* 53* 47*  CREATININE 1.89* 1.66* 1.75* 1.61* 1.57*  CALCIUM 9.8 9.6 9.3 9.6 9.2    Liver Function Tests:  Recent Labs Lab 10/02/15 0506  ALBUMIN 4.0   No results for input(s): LIPASE, AMYLASE in the last 168 hours. No results for input(s): AMMONIA in the last 168 hours.  CBC:  Recent Labs Lab 09/28/15 1233  WBC 4.9  HGB 12.1  HCT 37.2  MCV 81.5  PLT 150    Cardiac  Enzymes:  Recent Labs Lab 09/28/15 1233  TROPONINI <0.03    BNP: Invalid input(s): POCBNP  CBG:  Recent Labs Lab 10/02/15 0752  GLUCAP 110*    Microbiology: Results for orders placed or performed in visit on 03/22/12  Urine culture     Status: None   Collection Time: 03/22/12 11:51 AM  Result Value Ref Range Status   Micro Text Report   Final       SOURCE: CC    COMMENT                   MIXED BACTERIAL ORGANISMS   COMMENT                   RESULTS SUGGESTIVE OF CONTAMINATION   ANTIBIOTIC                                                      Culture, blood (single)     Status: None   Collection Time: 03/22/12 12:03 PM  Result Value Ref Range Status   Micro Text Report   Final       COMMENT  NO GROWTH AEROBICALLY/ANAEROBICALLY IN 5 DAYS   ANTIBIOTIC                                                      Culture, blood (single)     Status: None   Collection Time: 03/22/12 12:08 PM  Result Value Ref Range Status   Micro Text Report   Final       COMMENT                   NO GROWTH AEROBICALLY/ANAEROBICALLY IN 5 DAYS   ANTIBIOTIC                                                      Culture, blood (single)     Status: None   Collection Time: 03/24/12 11:09 AM  Result Value Ref Range Status   Micro Text Report   Final       COMMENT                   NO GROWTH AEROBICALLY/ANAEROBICALLY IN 5 DAYS   ANTIBIOTIC                                                        Coagulation Studies:  Recent Labs  10/02/15 0506 10/03/15 0453 10/04/15 0502  LABPROT 28.3* 27.3* 26.7*  INR 2.70 2.58 2.50    Urinalysis: No results for input(s): COLORURINE, LABSPEC, PHURINE, GLUCOSEU, HGBUR, BILIRUBINUR, KETONESUR, PROTEINUR, UROBILINOGEN, NITRITE, LEUKOCYTESUR in the last 72 hours.  Invalid input(s): APPERANCEUR    Imaging: US Venous Img Lower Bilateral  10/02/2015  CLINICAL DATA:  Lower extremity swelling. EXAM: BILATERAL LOWER EXTREMITY VENOUS  DOPPLER ULTRASOUND TECHNIQUE: Gray-scale sonography with graded compression, as well as color Doppler and duplex ultrasound were performed to evaluate the lower extremity deep venous systems from the level of the common femoral vein and including the common femoral, femoral, profunda femoral, popliteal and calf veins including the posterior tibial, peroneal and gastrocnemius veins when visible. The superficial great saphenous vein was also interrogated. Spectral Doppler was utilized to evaluate flow at rest and with distal augmentation maneuvers in the common femoral, femoral and popliteal veins. COMPARISON:  02/29/2012. FINDINGS: RIGHT LOWER EXTREMITY Common Femoral Vein: No evidence of thrombus. Normal compressibility, respiratory phasicity and response to augmentation. Saphenofemoral Junction: No evidence of thrombus. Normal compressibility and flow on color Doppler imaging. Profunda Femoral Vein: No evidence of thrombus. Normal compressibility and flow on color Doppler imaging. Femoral Vein: No evidence of thrombus. Normal compressibility, respiratory phasicity and response to augmentation. Popliteal Vein: No evidence of thrombus. Normal compressibility, respiratory phasicity and response to augmentation. Calf Veins: No evidence of thrombus. Normal compressibility and flow on color Doppler imaging. Superficial Great Saphenous Vein: No evidence of thrombus. Normal compressibility and flow on color Doppler imaging. Other Findings:  None. LEFT LOWER EXTREMITY Common Femoral Vein: No evidence of thrombus. Normal compressibility, respiratory phasicity and response to augmentation. Saphenofemoral Junction: No evidence of thrombus. Normal compressibility and flow on  color Doppler imaging. Profunda Femoral Vein: No evidence of thrombus. Normal compressibility and flow on color Doppler imaging. Femoral Vein: No evidence of thrombus. Normal compressibility, respiratory phasicity and response to augmentation. Popliteal  Vein: No evidence of thrombus. Normal compressibility, respiratory phasicity and response to augmentation. Calf Veins: No evidence of thrombus. Normal compressibility and flow on color Doppler imaging. Superficial Great Saphenous Vein: No evidence of thrombus. Normal compressibility and flow on color Doppler imaging. Other Findings:  None. IMPRESSION: No evidence of deep venous thrombosis. Electronically Signed   By: Marcello Moores  Register   On: 10/02/2015 12:20     Medications:     . amiodarone  400 mg Oral BID  . aspirin EC  81 mg Oral Daily  . furosemide  40 mg Intravenous BID  . metoprolol succinate  50 mg Oral Daily  . multivitamin with minerals  1 tablet Oral Daily  . pantoprazole  40 mg Oral Daily  . warfarin  5 mg Oral Once per day on Sun Mon Tue Thu Sat  . warfarin  7.5 mg Oral Once per day on Wed Fri  . Warfarin - Pharmacist Dosing Inpatient   Does not apply q1800   acetaminophen, traMADol, vitamin C  Assessment/ Plan:  [name] is a 81 y.o. white female with hypertension, systolic and diastolic congestive heart failure, mitral valve replacement 09/05/15, hyperlipidemia, atrial fibrillation, anemia, who was admitted to [facility] on 09/28/2015 for Acute on chronic congestive heart failure, unspecified congestive heart failure type ([place]) [I50.9]   1. Acute Renal Failure with hyperkalemia on chronic kidney disease stage III: baseline creatinine of 1.09 eGFR of 48 04/30/15   Likely ARF from acute cardiorenal syndrome - Agree with IV furosemide.  - Outpatient was taking Torsemide 40 BID - hold spironolactone due to renal insufficiency and hyperkalemia. Status post kayexalate on admission - hold losartan. (allergy to ACE-I) - albumin level is normal - UOP 1400 cc. S Cr improving slowly  2. LE edema with acute exacerbation of systolic and diastolic congestive heart failure with new mitral valve replacement - blood pressure low normal - IV furosemide 40 BID - LV EF: 40% - 45%;  Systolic pressure was mildly elevated. PA  peak pressure: 43 mm Hg   3. Hyponatremia: due to hypervolemia.  Now resolved   4. A Fib - Amiodarone in anticipation of Cardioversion later this week   LOS: 6 Schwanda Zima 4/5/20179:24 AM

## 2015-10-04 NOTE — Progress Notes (Signed)
Patient was able to remain in bed with legs elevated for 4-5 hours. Pt complained of discomfort but declined medication. Pt now in chair. States discomfort has greatly improved. Will continue to monitor.

## 2015-10-04 NOTE — Progress Notes (Signed)
[facility]  Daily Progress Note   Subjective  -Painful swollen legs  Today she has spent the majority of her time in bed with her legs elevated.  Her legs are much less painful and are not draining as they were.  Objective Filed Vitals:   10/04/15 1300 10/04/15 1523 10/04/15 1700 10/04/15 2106  BP:  117/66 111/62 96/57  Pulse:  135 120 132  Temp:  97.8 F (36.6 C)  98.4 F (36.9 C)  TempSrc:  Oral  Oral  Resp:      Height:      Weight: 82.645 kg (182 lb 3.2 oz)     SpO2:    90%    Intake/Output Summary (Last 24 hours) at 10/04/15 2228 Last data filed at 10/04/15 1300  Gross per 24 hour  Intake    240 ml  Output    200 ml  Net     40 ml    PULM  Normal effort , no use of accessory muscles CV  No JVD, RRR Abd      No distended, nontender VASC  There is a significant reduction in edema and her left leg dressing is dry  Many wrinkles are noted on the toes bilaterally  Laboratory CBC    Component Value Date/Time   WBC 4.9 09/28/2015 1233   WBC 6.1 03/27/2012 0418   HGB 12.1 09/28/2015 1233   HGB 11.9* 03/27/2012 0418   HCT 37.2 09/28/2015 1233   HCT 36.0 03/27/2012 0418   PLT 150 09/28/2015 1233   PLT 198 03/27/2012 0418    BMET    Component Value Date/Time   NA 135 10/04/2015 0502   NA 143 03/27/2012 0418   K 3.7 10/04/2015 0502   K 3.9 02/20/2015 0630   K 4.4 03/27/2012 0418   CL 102 10/04/2015 0502   CL 111* 03/27/2012 0418   CO2 27 10/04/2015 0502   CO2 21 03/27/2012 0418   GLUCOSE 115* 10/04/2015 0502   GLUCOSE 96 03/27/2012 0418   BUN 47* 10/04/2015 0502   BUN 10 03/27/2012 0418   CREATININE 1.57* 10/04/2015 0502   CREATININE 0.84 03/27/2012 0418   CALCIUM 9.2 10/04/2015 0502   CALCIUM 8.3* 03/27/2012 0418   GFRNONAA 30* 10/04/2015 0502   GFRNONAA >60 03/27/2012 0418   GFRAA 35* 10/04/2015 0502   GFRAA >60 03/27/2012 0418    Assessment/Planning:  #1 lymphedema Much improved with bedrest and elevation.  This is a  dramatic improvement compared to last night.  I will see if the [place] can provide 3 layer wraps  #2 acute kidney injury on chronic kidney disease Baseline creatinine is 1.09 and BUN at 43 in October 2016 which is a 2.29 today with BUN at 84 Will hold off on Cozaar and Demadex from worsening of renal function as well as hypotension Nephrology consult is placed Will monitor renal function closely while patient is on IV Lasix  #3acute respiratory distress secondary to acute exacerbation of systolic congestive heart failure Admit patient to telemetry Provide IV Lasix with close monitoring of intake and output and daily weights Holding her home medication spironolactone in view of hyperkalemia and soft blood pressure We will get an echocardiogram and cardiac consult is placed to Dr. Rockey Situ as requested by family Renal function needs to be monitored closely  #4 chronic atrial fibrillation and history of mechanical mitral valve replacement-rate controlled Check PT/INR and Coumadin management per pharmacy is done the PT/INR results  #5 recent  history of left lower extremity hematoma evacuation by Dr. Charissa Bash Continue wound care   #6 essential hypertension-currently blood pressure is soft Hold Cozaar, spironolactone and Demadex Continue metoprolol with holding parameters     Schnier, Dolores Lory  10/04/2015, 10:28 PM

## 2015-10-04 NOTE — Care Management Important Message (Signed)
Important Message  Patient Details  Name: [name] MRN: [id number] Date of Birth: [birth year]   Medicare Important Message Given:  Yes    Juliann Pulse A [name] 10/04/2015, 10:47 AM

## 2015-10-04 NOTE — Plan of Care (Signed)
Problem: Fluid Volume: Goal: Ability to maintain a balanced intake and output will improve Outcome: Progressing 1500 mL Fluid restriction

## 2015-10-04 NOTE — Progress Notes (Signed)
bp 81/66 hr 110 patient has metoprolol, lasix and amiodarone Dr. Anselm Jungling notified to clarify orders. Stated to give amiodarone but hold metoprolol and lasix. Will proceed with verbal order and continue to monitor BP.

## 2015-10-04 NOTE — Progress Notes (Signed)
ANTICOAGULATION CONSULT NOTE - FOLLOW UP   Pharmacy Consult for Warfarin Indication: Chronic Afib with Mitral Valve Replacement  Allergies  Allergen Reactions  . Ace Inhibitors     Other reaction(s): Cough  . Hydrocodone Itching and Nausea Only  . Mercury     Other reaction(s): Unknown  . Nickel   . Silver Dermatitis    Severe itching  . Cephalexin Rash  . Penicillins Rash    Patient Measurements: Height: 5' 8.5" (174 cm) Weight: 186 lb 14.4 oz (84.777 kg) IBW/kg (Calculated) : 65.05 Heparin Dosing Weight:   Vital Signs: Temp: 97.9 F (36.6 C) (04/05 0609) Temp Source: Oral (04/05 0609) BP: 81/66 mmHg (04/05 0924) Pulse Rate: 120 (04/05 0924)  Labs:  Recent Labs  10/02/15 0506 10/03/15 0453 10/04/15 0502  LABPROT 28.3* 27.3* 26.7*  INR 2.70 2.58 2.50  CREATININE 1.75* 1.61* 1.57*    Estimated Creatinine Clearance: 34 mL/min (by C-G formula based on Cr of 1.57).   Medical History: Past Medical History  Diagnosis Date  . Shortness of breath dyspnea   . Hypertension   . Chronic combined systolic (congestive) and diastolic (congestive) heart failure (HCC)     a. EF 25% by echo in 08/2015 b. RHC in 08/2015 showed normal filling pressures  . GERD (gastroesophageal reflux disease)   . Arthritis   . Chronic atrial fibrillation ([place])     a. On Coumadin  . S/P MVR (mitral valve replacement)     a. MVR 1994 b. redo MVR in 08/2014 - on Coumadin    Medications:  Scheduled:  . amiodarone  400 mg Oral BID  . aspirin EC  81 mg Oral Daily  . furosemide  40 mg Intravenous BID  . metoprolol succinate  50 mg Oral Daily  . multivitamin with minerals  1 tablet Oral Daily  . pantoprazole  40 mg Oral Daily  . warfarin  5 mg Oral Once per day on Sun Mon Tue Thu Sat  . warfarin  7.5 mg Oral Once per day on Wed Fri  . Warfarin - Pharmacist Dosing Inpatient   Does not apply q1800    Assessment: 81 yo admitted with SOB, pulmonary edema, CHF.  Hx chronic atrial  fibrillation on Coumadin, mitral valve replacement. Warfarin dose at home per PTA meds:  5 mg Mon, Tues, Thur, Sat,Sun  And 7.5mg   Wed, Fri.  3/30  INR 2.79 3/31 INR 3.13 4/1:  INR: 3.17 4/2: INR: 3.13 4/3: INR: 2.70 4/4: INR: 2.58 4/5: INR: 2.50  Goal of Therapy:  INR 2.5-3.5 Monitor platelets by anticoagulation protocol: Yes   Plan:  INR is still within therapeutic range for patient but trending down. Patient was started on Amiodarone 400mg  PO bid last evening. This will likely cause an increase in INR. Will need to monitor INR closely for next several days. Will continue with today's dose of warfarin 7.5mg . Will follow up on daily INR labs.  Paulina Fusi, PharmD, BCPS 10/04/2015 11:37 AM

## 2015-10-04 NOTE — Care Management (Addendum)
Barrier to discharge: Legs continue to weep. Vascular working with patient. Hypotensive. Atrial fibrillation but patient is refusing cardioversion. Continued diuresis and adjustment (holding lasix and metoprolol)  medications for hypotension.

## 2015-10-04 NOTE — Progress Notes (Addendum)
Patient: [name] / Admit Date: 09/28/2015 / Date of Encounter: 10/04/2015, 12:10 PM   Subjective: No acute overnight events. No SOB. BP soft this morning leading to holding of Lasix and metoprolol. She is not certain she wants DCCV at this time.   Review of Systems: Review of Systems  Constitutional: Positive for malaise/fatigue.  HENT: Negative.   Respiratory: Negative.   Cardiovascular: Positive for leg swelling.  Gastrointestinal: Negative.   Genitourinary: Negative.   Musculoskeletal: Negative.   Skin: Negative.   Neurological: Negative.   Endo/Heme/Allergies: Negative.   Psychiatric/Behavioral: Negative.     Objective: Telemetry: Afib RVR, 130's Physical Exam: Blood pressure 81/66, pulse 120, temperature 97.9 F (36.6 C), temperature source Oral, resp. rate 20, height 5' 8.5" (1.74 m), weight 186 lb 14.4 oz (84.777 kg), SpO2 95 %. Body mass index is 28 kg/(m^2). General: Well developed, well nourished, in no acute distress. Head: Normocephalic, atraumatic, sclera non-icteric, no xanthomas, nares are without discharge. Neck: Negative for carotid bruits. JVP not elevated. Lungs: Clear bilaterally to auscultation without wheezes, rales, or rhonchi. Breathing is unlabored. Heart: Tachycardic, irregularly irregular S1 S2 without murmurs, rubs, or gallops.  Abdomen: Soft, non-tender, non-distended with normoactive bowel sounds. No rebound/guarding. Extremities: No clubbing or cyanosis. 3+ bilateral pitting edema with stasis dermatitis. Ulcer along inner left LE. Neuro: Alert and oriented X 3. Moves all extremities spontaneously. Psych:  Responds to questions appropriately with a normal affect.   Intake/Output Summary (Last 24 hours) at 10/04/15 1210 Last data filed at 10/04/15 0830  Gross per 24 hour  Intake    480 ml  Output   1000 ml  Net   -520 ml    Inpatient Medications:  . amiodarone  400 mg Oral BID  . aspirin EC  81 mg Oral Daily  . furosemide  40  mg Intravenous BID  . metoprolol succinate  50 mg Oral Daily  . multivitamin with minerals  1 tablet Oral Daily  . pantoprazole  40 mg Oral Daily  . warfarin  5 mg Oral Once per day on Sun Mon Tue Thu Sat  . warfarin  7.5 mg Oral Once per day on Wed Fri  . Warfarin - Pharmacist Dosing Inpatient   Does not apply q1800   Infusions:    Labs:  Recent Labs  10/03/15 0453 10/04/15 0502  NA 135 135  K 3.9 3.7  CL 99* 102  CO2 27 27  GLUCOSE 121* 115*  BUN 53* 47*  CREATININE 1.61* 1.57*  CALCIUM 9.6 9.2    Recent Labs  10/02/15 0506  ALBUMIN 4.0   No results for input(s): WBC, NEUTROABS, HGB, HCT, MCV, PLT in the last 72 hours. No results for input(s): CKTOTAL, CKMB, TROPONINI in the last 72 hours. Invalid input(s): POCBNP No results for input(s): HGBA1C in the last 72 hours.   Weights: Filed Weights   10/02/15 0454 10/03/15 0441 10/04/15 0609  Weight: 182 lb 3.2 oz (82.645 kg) 181 lb 11.2 oz (82.419 kg) 186 lb 14.4 oz (84.777 kg)     Radiology/Studies:  Dg Chest 2 View  09/28/2015  CLINICAL DATA:  81 year old female with increased lower extremity swelling, shortness of breath for 1 day. Initial encounter. EXAM: CHEST  2 VIEW COMPARISON:  Chest radiographs 03/24/2012. FINDINGS: Larger lung volumes with resolved small pleural effusions present in 2013. Chronic cardiomegaly appears stable. Sequelae of cardiac valve replacement and median sternotomy. Other mediastinal contours are within normal limits. Visualized tracheal air column is within  normal limits. No pneumothorax. Pulmonary vascular congestion without overt edema. No confluent pulmonary opacity. Osteopenia with multilevel chronic appearing thoracic compression fractures. No acute osseous abnormality identified. IMPRESSION: Cardiomegaly and pulmonary vascular congestion without overt edema. Electronically Signed   By: Genevie Ann M.D.   On: 09/28/2015 13:13   US Venous Img Lower Bilateral  10/02/2015  CLINICAL DATA:  Lower  extremity swelling. EXAM: BILATERAL LOWER EXTREMITY VENOUS DOPPLER ULTRASOUND TECHNIQUE: Gray-scale sonography with graded compression, as well as color Doppler and duplex ultrasound were performed to evaluate the lower extremity deep venous systems from the level of the common femoral vein and including the common femoral, femoral, profunda femoral, popliteal and calf veins including the posterior tibial, peroneal and gastrocnemius veins when visible. The superficial great saphenous vein was also interrogated. Spectral Doppler was utilized to evaluate flow at rest and with distal augmentation maneuvers in the common femoral, femoral and popliteal veins. COMPARISON:  02/29/2012. FINDINGS: RIGHT LOWER EXTREMITY Common Femoral Vein: No evidence of thrombus. Normal compressibility, respiratory phasicity and response to augmentation. Saphenofemoral Junction: No evidence of thrombus. Normal compressibility and flow on color Doppler imaging. Profunda Femoral Vein: No evidence of thrombus. Normal compressibility and flow on color Doppler imaging. Femoral Vein: No evidence of thrombus. Normal compressibility, respiratory phasicity and response to augmentation. Popliteal Vein: No evidence of thrombus. Normal compressibility, respiratory phasicity and response to augmentation. Calf Veins: No evidence of thrombus. Normal compressibility and flow on color Doppler imaging. Superficial Great Saphenous Vein: No evidence of thrombus. Normal compressibility and flow on color Doppler imaging. Other Findings:  None. LEFT LOWER EXTREMITY Common Femoral Vein: No evidence of thrombus. Normal compressibility, respiratory phasicity and response to augmentation. Saphenofemoral Junction: No evidence of thrombus. Normal compressibility and flow on color Doppler imaging. Profunda Femoral Vein: No evidence of thrombus. Normal compressibility and flow on color Doppler imaging. Femoral Vein: No evidence of thrombus. Normal compressibility,  respiratory phasicity and response to augmentation. Popliteal Vein: No evidence of thrombus. Normal compressibility, respiratory phasicity and response to augmentation. Calf Veins: No evidence of thrombus. Normal compressibility and flow on color Doppler imaging. Superficial Great Saphenous Vein: No evidence of thrombus. Normal compressibility and flow on color Doppler imaging. Other Findings:  None. IMPRESSION: No evidence of deep venous thrombosis. Electronically Signed   By: Marcello Moores  Register   On: 10/02/2015 12:20     Assessment and Plan   1. Persistent Afib with RVR: -Rate continues to be poorly controlled -Possibly chronic at this point -Would benefit from attempt at restoration of sinus rhythm  -Continue amiodarone 400 mg bid at this time for rate control, may ultimately discontinue given her persistent Afib -She does not want DCCV at this time given the low likelihood of restoration of NSR - would prefer to wait and have it done as an outpatient.  -On Coumadin   2. Acute on chronic combined CHF: -Recent discharge weight of 178-179, current weight of 181 on 4/4 -Has diuresed 2.6 L for the admission and 860 mL for the past 24 hours -Renal on board for help with diuresis given patient's CKD  -Has been started on Toprol XL, BP soft, leading to intermittent holding of medication -May need intermittent metolazone for added diuresis as BP allows -? Lasix gtt to aid some some diuresis though suspect the bulk of her LE edema is lymphedema in nature   3. Acute on CKD stage III: -Renal function improved today -Monitor closely with diuresis, though doubt she will require aggressive diuresis  -Per renal -  Spironolactone and losartan on hold given the above  4. Bilateral LE edema: -Multifactorial with CHF and lymphedema -Though felt to be more lymphedema based on RHC at [facility] -Continue diuresis and wraps/unaboots -Vascular on board  5. Mechanical mitral valve: -Possibly playing a role in  #2 -Coumadin   Signed, Christell Faith, PA-C Pager: [phone] 10/04/2015, 12:10 PM  I have seen, examined and evaluated the patient this afternoon along with Mr. Idolina Primer, [place].  After reviewing all the available data and chart,  I agree with his findings, examination as well as impression recommendations.  Mrs. Averbeck seems to be that her overall clinically improving with better edema and no dyspnea. However now her heart rate is becoming more difficult to control. She was started on oral amiodarone. There is some consideration for trying cardioversion later this week, however she would prefer to wait longer. She was under the impression that that was what she was told last night. That is not what Dr. Theda Sers in his note. But I did not argue with her. There is a possibility that she has not been on amiodarone long enough and may not cardiovert. I don't think she has good substrate for cardioversion without a full amiodarone load. She would prefer to wait even potentially as an outpatient for cardioversion unless rate control just becomes too difficult. Blood pressures have been soft making it hard to continue diuresis. But I still think we need to continue to push with Lasix even with possible metolazone. Her renal function is stable. We may need to hold her beta blocker for blood pressure room, which is acceptable in the setting of ongoing amiodarone therapy. She is less likely to have rebound tachycardia.  I spent 20 minutes talking with her and her daughter today about the pros and cons of defibrillation, as well as the timing of it. We do need to consider the importance of rate control with her low EF however. If her rates continue to be in the 120s, I think we need to readdress with her tomorrow the possibility of attempting cardioversion by Friday. Otherwise if her rate is adequately controlled, we can consider discharge on amiodarone with plan for outpatient cardioversion.  The latter would  obviously be her choice.  We will need to closely monitor her rate responsiveness to the amiodarone as it is loaded. The more amiodarone load she can receive, the more likely she is to cardiovert.  Between the M.D. N PA, total of 45 minutes spent with this patient. Greater than 50% was spent in discussing plan of action in consultation discussing treatment choices as well as describing her clinical condition.   Leonie Man, M.D., M.S. Interventional Cardiologist   Pager # [phone] Phone # [phone] [address] [place],  [address]

## 2015-10-05 ENCOUNTER — Telehealth: Payer: Self-pay | Admitting: Cardiovascular Disease

## 2015-10-05 LAB — CBC
HEMATOCRIT: 32.8 % — AB (ref 35.0–47.0)
Hemoglobin: 10.6 g/dL — ABNORMAL LOW (ref 12.0–16.0)
MCH: 26.4 pg (ref 26.0–34.0)
MCHC: 32.4 g/dL (ref 32.0–36.0)
MCV: 81.4 fL (ref 80.0–100.0)
Platelets: 115 10*3/uL — ABNORMAL LOW (ref 150–440)
RBC: 4.03 MIL/uL (ref 3.80–5.20)
RDW: 19.8 % — AB (ref 11.5–14.5)
WBC: 5 10*3/uL (ref 3.6–11.0)

## 2015-10-05 LAB — BASIC METABOLIC PANEL
ANION GAP: 5 (ref 5–15)
BUN: 40 mg/dL — ABNORMAL HIGH (ref 6–20)
CALCIUM: 8.8 mg/dL — AB (ref 8.9–10.3)
CO2: 28 mmol/L (ref 22–32)
Chloride: 100 mmol/L — ABNORMAL LOW (ref 101–111)
Creatinine, Ser: 1.66 mg/dL — ABNORMAL HIGH (ref 0.44–1.00)
GFR, EST AFRICAN AMERICAN: 33 mL/min — AB (ref >60)
GFR, EST NON AFRICAN AMERICAN: 28 mL/min — AB (ref >60)
Glucose, Bld: 114 mg/dL — ABNORMAL HIGH (ref 65–99)
Potassium: 3.8 mmol/L (ref 3.5–5.1)
SODIUM: 133 mmol/L — AB (ref 135–145)

## 2015-10-05 LAB — PROTIME-INR
INR: 2.91
Prothrombin Time: 29.9 seconds — ABNORMAL HIGH (ref 11.4–15.0)

## 2015-10-05 MED ORDER — ACETAMINOPHEN 500 MG PO TABS
500.0000 mg | ORAL_TABLET | Freq: Three times a day (TID) | ORAL | Status: DC
  Administered 2015-10-05 – 2015-10-10 (×16): 500 mg via ORAL
  Filled 2015-10-05 (×16): qty 1

## 2015-10-05 MED ORDER — WARFARIN SODIUM 1 MG PO TABS: 4.0000 mg | ORAL_TABLET | Freq: Once | ORAL | Status: DC

## 2015-10-05 MED ORDER — FUROSEMIDE 10 MG/ML IJ SOLN
20.0000 mg | Freq: Once | INTRAMUSCULAR | Status: AC
  Administered 2015-10-05: 20 mg via INTRAVENOUS

## 2015-10-05 MED ORDER — FUROSEMIDE 40 MG PO TABS
40.0000 mg | ORAL_TABLET | Freq: Every day | ORAL | Status: DC
  Administered 2015-10-06 – 2015-10-10 (×4): 40 mg via ORAL
  Filled 2015-10-05 (×6): qty 1

## 2015-10-05 MED ORDER — WARFARIN SODIUM 5 MG PO TABS
5.0000 mg | ORAL_TABLET | Freq: Once | ORAL | Status: AC
  Administered 2015-10-05: 5 mg via ORAL
  Filled 2015-10-05: qty 1

## 2015-10-05 MED ORDER — HYDROCODONE-ACETAMINOPHEN 5-325 MG PO TABS
1.0000 | ORAL_TABLET | Freq: Four times a day (QID) | ORAL | Status: DC | PRN
  Administered 2015-10-07: 1 via ORAL
  Filled 2015-10-05: qty 1

## 2015-10-05 NOTE — Progress Notes (Signed)
Patients son is concerned that his mother is being discharged too soon. Stated that he thinks that she isn't any better than when she came in. Dr. Anselm Jungling notified of concerns and stated he will get in touch with patients family

## 2015-10-05 NOTE — Progress Notes (Signed)
[facility]  Daily Progress Note   Subjective  - * No surgery found *  Was able to tolerate being in bed last night  She is concerned about cardioversion  Objective Filed Vitals:   10/05/15 0433 10/05/15 0500 10/05/15 0831 10/05/15 1113  BP: 84/49  87/60 87/50  Pulse: 116  103 117  Temp: 98.2 F (36.8 C)   97.9 F (36.6 C)  TempSrc: Oral   Oral  Resp: 18   21  Height:      Weight:  82.918 kg (182 lb 12.8 oz)    SpO2: 91%  92% 90%    Intake/Output Summary (Last 24 hours) at 10/05/15 1804 Last data filed at 10/05/15 1120  Gross per 24 hour  Intake    480 ml  Output    500 ml  Net    -20 ml    PULM  Normal effort , no use of accessory muscles CV  No JVD, RRR Abd      No distended, nontender VASC  Edema 3+ less tense than before less erythema Laboratory CBC    Component Value Date/Time   WBC 5.0 10/05/2015 0450   WBC 6.1 03/27/2012 0418   HGB 10.6* 10/05/2015 0450   HGB 11.9* 03/27/2012 0418   HCT 32.8* 10/05/2015 0450   HCT 36.0 03/27/2012 0418   PLT 115* 10/05/2015 0450   PLT 198 03/27/2012 0418    BMET    Component Value Date/Time   NA 133* 10/05/2015 0450   NA 143 03/27/2012 0418   K 3.8 10/05/2015 0450   K 3.9 02/20/2015 0630   K 4.4 03/27/2012 0418   CL 100* 10/05/2015 0450   CL 111* 03/27/2012 0418   CO2 28 10/05/2015 0450   CO2 21 03/27/2012 0418   GLUCOSE 114* 10/05/2015 0450   GLUCOSE 96 03/27/2012 0418   BUN 40* 10/05/2015 0450   BUN 10 03/27/2012 0418   CREATININE 1.66* 10/05/2015 0450   CREATININE 0.84 03/27/2012 0418   CALCIUM 8.8* 10/05/2015 0450   CALCIUM 8.3* 03/27/2012 0418   GFRNONAA 28* 10/05/2015 0450   GFRNONAA >60 03/27/2012 0418   GFRAA 33* 10/05/2015 0450   GFRAA >60 03/27/2012 0418    Assessment/Planning:  #1 lymphedema Much improved with bedrest and elevation. This is a dramatic improvement compared to last night. I discussed with Dr Con Memos whether I can get  3 layer wraps and Iodoflex from  the [facility] and this may not be feasible  I will need to readdress the possibility of Unna boots    Schnier, Dolores Lory  10/05/2015, 6:04 PM

## 2015-10-05 NOTE — Telephone Encounter (Signed)
Pt daughter called, states pt is inpatient in [facility], and would like to talk w Dr. Rockey Situ. States Dr. Ellyn Hack stopped by yesterday, and she has some questions. Please call. States she is getting conflicting information.

## 2015-10-05 NOTE — Progress Notes (Signed)
BP 81/62 scheduled to take metoprolol, amiodarone and 40 mg lasix this am. Dr. Anselm Jungling notified stated to hold metoprolol but give amiodarone and only 20 mg of lasix

## 2015-10-05 NOTE — Progress Notes (Signed)
Patient ambulated in hall 2 times around nurses station without any complications.

## 2015-10-05 NOTE — Progress Notes (Signed)
Patient: [name] / Admit Date: 09/28/2015 / Date of Encounter: 10/05/2015, 10:51 AM   Subjective: No acute overnight events. No SOB. BP soft this morning leading to holding of Lasix and metoprolol.   Review of Systems: Review of Systems  Constitutional: Positive for malaise/fatigue.  HENT: Negative.   Respiratory: Negative.   Cardiovascular: Positive for leg swelling.  Gastrointestinal: Negative.   Genitourinary: Negative.   Musculoskeletal: Negative.   Skin: Negative.   Neurological: Negative.   Endo/Heme/Allergies: Negative.   Psychiatric/Behavioral: Negative.     Objective: Telemetry: Afib RVR, 130's Physical Exam: Blood pressure 87/60, pulse 103, temperature 98.2 F (36.8 C), temperature source Oral, resp. rate 18, height 5' 8.5" (1.74 m), weight 182 lb 12.8 oz (82.918 kg), SpO2 92 %. Body mass index is 27.39 kg/(m^2). General: Well developed, well nourished, in no acute distress. Head: Normocephalic, atraumatic, sclera non-icteric, no xanthomas, nares are without discharge. Neck: Negative for carotid bruits. JVP not elevated. Lungs: Clear bilaterally to auscultation without wheezes, rales, or rhonchi. Breathing is unlabored. Heart: Tachycardic, irregularly irregular S1 S2 without murmurs, rubs, or gallops. Normal mechanical heart sounds Abdomen: Soft, non-tender, non-distended with normoactive bowel sounds. No rebound/guarding. Extremities: No clubbing or cyanosis. 3+ bilateral pitting edema with stasis dermatitis. Ulcer along inner left LE. Neuro: Alert and oriented X 3. Moves all extremities spontaneously. Psych:  Responds to questions appropriately with a normal affect.   Intake/Output Summary (Last 24 hours) at 10/05/15 1051 Last data filed at 10/05/15 0900  Gross per 24 hour  Intake    240 ml  Output   1200 ml  Net   -960 ml    Inpatient Medications:  . acetaminophen  500 mg Oral TID  . amiodarone  400 mg Oral BID  . aspirin EC  81 mg Oral Daily    . furosemide  40 mg Intravenous BID  . metoprolol succinate  50 mg Oral Daily  . multivitamin with minerals  1 tablet Oral Daily  . pantoprazole  40 mg Oral Daily  . warfarin  5 mg Oral ONCE-1800  . Warfarin - Pharmacist Dosing Inpatient   Does not apply q1800   Infusions:    Labs:  Recent Labs  10/04/15 0502 10/05/15 0450  NA 135 133*  K 3.7 3.8  CL 102 100*  CO2 27 28  GLUCOSE 115* 114*  BUN 47* 40*  CREATININE 1.57* 1.66*  CALCIUM 9.2 8.8*   No results for input(s): AST, ALT, ALKPHOS, BILITOT, PROT, ALBUMIN in the last 72 hours.  Recent Labs  10/05/15 0450  WBC 5.0  HGB 10.6*  HCT 32.8*  MCV 81.4  PLT 115*   No results for input(s): CKTOTAL, CKMB, TROPONINI in the last 72 hours. Invalid input(s): POCBNP No results for input(s): HGBA1C in the last 72 hours.   Weights: Filed Weights   10/04/15 0609 10/04/15 1300 10/05/15 0500  Weight: 186 lb 14.4 oz (84.777 kg) 182 lb 3.2 oz (82.645 kg) 182 lb 12.8 oz (82.918 kg)     Radiology/Studies:  Dg Chest 2 View  09/28/2015  CLINICAL DATA:  81 year old female with increased lower extremity swelling, shortness of breath for 1 day. Initial encounter. EXAM: CHEST  2 VIEW COMPARISON:  Chest radiographs 03/24/2012. FINDINGS: Larger lung volumes with resolved small pleural effusions present in 2013. Chronic cardiomegaly appears stable. Sequelae of cardiac valve replacement and median sternotomy. Other mediastinal contours are within normal limits. Visualized tracheal air column is within normal limits. No pneumothorax. Pulmonary vascular congestion  without overt edema. No confluent pulmonary opacity. Osteopenia with multilevel chronic appearing thoracic compression fractures. No acute osseous abnormality identified. IMPRESSION: Cardiomegaly and pulmonary vascular congestion without overt edema. Electronically Signed   By: Genevie Ann M.D.   On: 09/28/2015 13:13   US Venous Img Lower Bilateral  10/02/2015  CLINICAL DATA:  Lower  extremity swelling. EXAM: BILATERAL LOWER EXTREMITY VENOUS DOPPLER ULTRASOUND TECHNIQUE: Gray-scale sonography with graded compression, as well as color Doppler and duplex ultrasound were performed to evaluate the lower extremity deep venous systems from the level of the common femoral vein and including the common femoral, femoral, profunda femoral, popliteal and calf veins including the posterior tibial, peroneal and gastrocnemius veins when visible. The superficial great saphenous vein was also interrogated. Spectral Doppler was utilized to evaluate flow at rest and with distal augmentation maneuvers in the common femoral, femoral and popliteal veins. COMPARISON:  02/29/2012. FINDINGS: RIGHT LOWER EXTREMITY Common Femoral Vein: No evidence of thrombus. Normal compressibility, respiratory phasicity and response to augmentation. Saphenofemoral Junction: No evidence of thrombus. Normal compressibility and flow on color Doppler imaging. Profunda Femoral Vein: No evidence of thrombus. Normal compressibility and flow on color Doppler imaging. Femoral Vein: No evidence of thrombus. Normal compressibility, respiratory phasicity and response to augmentation. Popliteal Vein: No evidence of thrombus. Normal compressibility, respiratory phasicity and response to augmentation. Calf Veins: No evidence of thrombus. Normal compressibility and flow on color Doppler imaging. Superficial Great Saphenous Vein: No evidence of thrombus. Normal compressibility and flow on color Doppler imaging. Other Findings:  None. LEFT LOWER EXTREMITY Common Femoral Vein: No evidence of thrombus. Normal compressibility, respiratory phasicity and response to augmentation. Saphenofemoral Junction: No evidence of thrombus. Normal compressibility and flow on color Doppler imaging. Profunda Femoral Vein: No evidence of thrombus. Normal compressibility and flow on color Doppler imaging. Femoral Vein: No evidence of thrombus. Normal compressibility,  respiratory phasicity and response to augmentation. Popliteal Vein: No evidence of thrombus. Normal compressibility, respiratory phasicity and response to augmentation. Calf Veins: No evidence of thrombus. Normal compressibility and flow on color Doppler imaging. Superficial Great Saphenous Vein: No evidence of thrombus. Normal compressibility and flow on color Doppler imaging. Other Findings:  None. IMPRESSION: No evidence of deep venous thrombosis. Electronically Signed   By: Marcello Moores  Register   On: 10/02/2015 12:20     Assessment and Plan   1. Persistent Afib with RVR: - Rate control is limited by low blood pressure. -Would benefit from attempt at restoration of sinus rhythm . Again this was discussed with her and she is still undecided if she wants to proceed or not. -Continue amiodarone 400 mg bid at this time for rate control and in anticipation of possible proceeding with cardioversion. - Continue anticoagulation with warfarin  - I think the best option is to try cardioversion tomorrow given that she has been on amiodarone for a few days. The patient wants to discuss with her family. If she doesn't want to proceed, then her ventricular rate is reasonably controlled for discharge from the hospital on amiodarone 200 mg twice daily and small dose Toprol 25 mg once daily.   2. Acute on chronic combined CHF: - Her leg edema seems to be due to lymphedema. Avoid aggressive diuresis. Diuresis has been limited anyway by hypotension. I switched furosemide to 40 mg by mouth once daily  3. Acute on CKD stage III: -Renal function improved today   4. Bilateral LE edema: -Multifactorial with CHF and lymphedema -Though felt to be more lymphedema based on  RHC at [place] diuresis and wraps/unaboots -Vascular on board  5. Mechanical mitral valve: - Continue warfarin anticoagulation.   Signed  10/05/2015, 10:51 AM Kathlyn Sacramento, M.D.

## 2015-10-05 NOTE — Telephone Encounter (Signed)
Talked with the patient initially at the bedside. She prefers to wait to have an outpatient DCCV instead of doing this tomorrow. She asked for me to talk with her daughter about this more.  The patient's daughter prefers for her mom to have the DCCV tomorrow. However, she says she will ultimately leave the decision up to her mother.   I informed her the patient's HR is controlled in the 90's - low-100's and she could be discharged from a Cardiology perspective and come back for the outpatient DCCV if that is what she prefers.   She is unsure of what option they prefer. Right now, she wants to respect her mom's decision and wait for an outpatient DCCV in 3 weeks. I informed her she will not be added to the DCCV schedule for tomorrow then.   She was in agreement with this and voiced appreciation of the call.  Signed, Erma Heritage, PA-C 10/05/2015, 3:03 PM Pager: [phone]

## 2015-10-05 NOTE — Progress Notes (Signed)
[facility]  ROUNDING NOTE   Subjective:   Ambulatory in the room .States that her led edema is slightly better  Serum creatinine 1.61->1.57->1.67   Objective:  Vital signs in last 24 hours:  Temp:  [97.8 F (36.6 C)-98.4 F (36.9 C)] 98.2 F (36.8 C) (04/06 0433) Pulse Rate:  [103-135] 103 (04/06 0831) Resp:  [18] 18 (04/06 0433) BP: (84-117)/(49-66) 87/60 mmHg (04/06 0831) SpO2:  [90 %-96 %] 92 % (04/06 0831) Weight:  [82.645 kg (182 lb 3.2 oz)-82.918 kg (182 lb 12.8 oz)] 82.918 kg (182 lb 12.8 oz) (04/06 0500)  Weight change: -2.132 kg (-4 lb 11.2 oz) Filed Weights   10/04/15 0609 10/04/15 1300 10/05/15 0500  Weight: 84.777 kg (186 lb 14.4 oz) 82.645 kg (182 lb 3.2 oz) 82.918 kg (182 lb 12.8 oz)    Intake/Output: I/O last 3 completed shifts: In: 240 [P.O.:240] Out: 2200 [Urine:2200]   Intake/Output this shift:  Total I/O In: 240 [P.O.:240] Out: 500 [Urine:500]  Physical Exam: General: NAD, sitting in chair  Head: Normocephalic, atraumatic. Moist oral mucosal membranes  Eyes: Anicteric,   Neck: Supple, trachea midline  Lungs:  Clear bilaterally  Heart: Irregular, A Fib  Abdomen:  Soft, nontender,   Extremities:  ++ peripheral edema upto mid shins.   Neurologic: Nonfocal, moving all four extremities  Skin: No acute lesions       Basic Metabolic Panel:  Recent Labs Lab 10/01/15 0557 10/02/15 0506 10/03/15 0453 10/04/15 0502 10/05/15 0450  NA 135 134* 135 135 133*  K 4.1 3.6 3.9 3.7 3.8  CL 100* 98* 99* 102 100*  CO2 26 28 27 27 28   GLUCOSE 113* 111* 121* 115* 114*  BUN 63* 55* 53* 47* 40*  CREATININE 1.66* 1.75* 1.61* 1.57* 1.66*  CALCIUM 9.6 9.3 9.6 9.2 8.8*    Liver Function Tests:  Recent Labs Lab 10/02/15 0506  ALBUMIN 4.0   No results for input(s): LIPASE, AMYLASE in the last 168 hours. No results for input(s): AMMONIA in the last 168 hours.  CBC:  Recent Labs Lab 09/28/15 1233 10/05/15 0450  WBC 4.9 5.0  HGB  12.1 10.6*  HCT 37.2 32.8*  MCV 81.5 81.4  PLT 150 115*    Cardiac Enzymes:  Recent Labs Lab 09/28/15 1233  TROPONINI <0.03    BNP: Invalid input(s): POCBNP  CBG:  Recent Labs Lab 10/02/15 0752  GLUCAP 110*    Microbiology: Results for orders placed or performed in visit on 03/22/12  Urine culture     Status: None   Collection Time: 03/22/12 11:51 AM  Result Value Ref Range Status   Micro Text Report   Final       SOURCE: CC    COMMENT                   MIXED BACTERIAL ORGANISMS   COMMENT                   RESULTS SUGGESTIVE OF CONTAMINATION   ANTIBIOTIC                                                      Culture, blood (single)     Status: None   Collection Time: 03/22/12 12:03 PM  Result Value Ref Range Status   Micro Text Report  Final       COMMENT                   NO GROWTH AEROBICALLY/ANAEROBICALLY IN 5 DAYS   ANTIBIOTIC                                                      Culture, blood (single)     Status: None   Collection Time: 03/22/12 12:08 PM  Result Value Ref Range Status   Micro Text Report   Final       COMMENT                   NO GROWTH AEROBICALLY/ANAEROBICALLY IN 5 DAYS   ANTIBIOTIC                                                      Culture, blood (single)     Status: None   Collection Time: 03/24/12 11:09 AM  Result Value Ref Range Status   Micro Text Report   Final       COMMENT                   NO GROWTH AEROBICALLY/ANAEROBICALLY IN 5 DAYS   ANTIBIOTIC                                                        Coagulation Studies:  Recent Labs  10/03/15 0453 10/04/15 0502 10/05/15 0450  LABPROT 27.3* 26.7* 29.9*  INR 2.58 2.50 2.91    Urinalysis: No results for input(s): COLORURINE, LABSPEC, PHURINE, GLUCOSEU, HGBUR, BILIRUBINUR, KETONESUR, PROTEINUR, UROBILINOGEN, NITRITE, LEUKOCYTESUR in the last 72 hours.  Invalid input(s): APPERANCEUR    Imaging: No results found.   Medications:     .  acetaminophen  500 mg Oral TID  . amiodarone  400 mg Oral BID  . aspirin EC  81 mg Oral Daily  . [START ON 10/06/2015] furosemide  40 mg Oral Daily  . metoprolol succinate  50 mg Oral Daily  . multivitamin with minerals  1 tablet Oral Daily  . pantoprazole  40 mg Oral Daily  . warfarin  5 mg Oral ONCE-1800  . Warfarin - Pharmacist Dosing Inpatient   Does not apply q1800   HYDROcodone-acetaminophen, traMADol, vitamin C  Assessment/ Plan:  [name] is a 81 y.o. white female with hypertension, systolic and diastolic congestive heart failure, mitral valve replacement 09/05/15, hyperlipidemia, atrial fibrillation, anemia, who was admitted to [facility] on 09/28/2015 for Acute on chronic congestive heart failure, unspecified congestive heart failure type ([place]) [I50.9]   1. Acute Renal Failure with hyperkalemia on chronic kidney disease stage III: baseline creatinine of 1.09 eGFR of 48 04/30/15   Likely ARF from acute cardiorenal syndrome - Outpatient, patient was taking Torsemide 40 BID - hold spironolactone due to renal insufficiency and hyperkalemia. Status post kayexalate on admission - hold losartan. (allergy to ACE-I) - albumin level is normal - S Cr stable now  2. LE  edema with acute exacerbation of systolic and diastolic congestive heart failure with new mitral valve replacement - blood pressure low normal - LV EF: 40% - 45%; Systolic pressure was mildly elevated. PA  peak pressure: 43 mm Hg  - Agree with Lasix or Torsemide 40 mg once a day for now  3. Hyponatremia: due to hypervolemia.  Now resolved   4. A Fib - Amiodarone    LOS: 7 Rockney Grenz 4/6/201711:02 AM

## 2015-10-05 NOTE — Progress Notes (Signed)
[place] [facility] [place] NAME: [name]    MR#:  [id number]  DATE OF BIRTH:  [birth year]  SUBJECTIVE:  CHIEF COMPLAINT:   Chief Complaint  Patient presents with  . Shortness of Breath  . Leg Swelling   Came with severe leg swelling and weight gain, was admitted to [facility] last month- but did not have much diuresis there. Sent in by her PMD. Have good response to IV lasix. Still have significant edema on legs, was having una boots and compressive pumps on legs.  have skin breakdown and blister on left leg. Some improvement on leg edema. Urine output is now properly measured, renal function improving.   Leg edema slightly better after keeping leg elevated or last night  REVIEW OF SYSTEMS:  CONSTITUTIONAL: No fever, fatigue or weakness.  EYES: No blurred or double vision.  EARS, NOSE, AND THROAT: No tinnitus or ear pain.  RESPIRATORY: No cough, shortness of breath, wheezing or hemoptysis.  CARDIOVASCULAR: No chest pain, orthopnea,positive for severe edema.  GASTROINTESTINAL: No nausea, vomiting, diarrhea or abdominal pain.  GENITOURINARY: No dysuria, hematuria.  ENDOCRINE: No polyuria, nocturia,  HEMATOLOGY: No anemia, easy bruising or bleeding SKIN: No rash or lesion. MUSCULOSKELETAL: No joint pain or arthritis.   NEUROLOGIC: No tingling, numbness, weakness.  PSYCHIATRY: No anxiety or depression.   ROS  DRUG ALLERGIES:   Allergies  Allergen Reactions  . Ace Inhibitors     Other reaction(s): Cough  . Hydrocodone Itching and Nausea Only  . Mercury     Other reaction(s): Unknown  . Nickel   . Silver Dermatitis    Severe itching  . Cephalexin Rash  . Penicillins Rash    VITALS:  Blood pressure 87/60, pulse 103, temperature 98.2 F (36.8 C), temperature source Oral, resp. rate 18, height 5' 8.5" (1.74 m), weight 82.918 kg (182 lb 12.8 oz), SpO2 92 %.  PHYSICAL EXAMINATION:  GENERAL:  81 y.o.-year-old patient sitting  in chair, with no acute distress.  EYES: Pupils equal, round, reactive to light and accommodation. No scleral icterus. Extraocular muscles intact.  HEENT: Head atraumatic, normocephalic. Oropharynx and nasopharynx clear.  NECK:  Supple, no jugular venous distention. No thyroid enlargement, no tenderness.  LUNGS: Normal breath sounds bilaterally, no wheezing, rales,rhonchi or crepitation. No use of accessory muscles of respiration.  CARDIOVASCULAR: S1, S2 normal. Positive for murmurs, severe b/l leg and thigh edema ABDOMEN: Soft, nontender, nondistended. Bowel sounds present. No organomegaly or mass.  EXTREMITIES: severe pedal edema, no cyanosis, or clubbing. On left leg- skin breakdown with a large 5 cm water filled blister. no redness or warmth on skin. Edema slight better today. NEUROLOGIC: Cranial nerves II through XII are intact. Muscle strength 5/5 in all extremities. Sensation intact. Gait not checked.  PSYCHIATRIC: The patient is alert and oriented x 3.  SKIN: on left leg as above.   Physical Exam LABORATORY PANEL:   CBC  Recent Labs Lab 10/05/15 0450  WBC 5.0  HGB 10.6*  HCT 32.8*  PLT 115*   ------------------------------------------------------------------------------------------------------------------  Chemistries   Recent Labs Lab 10/04/15 0502  NA 135  K 3.7  CL 102  CO2 27  GLUCOSE 115*  BUN 47*  CREATININE 1.57*  CALCIUM 9.2   ------------------------------------------------------------------------------------------------------------------  Cardiac Enzymes  Recent Labs Lab 09/28/15 1233  TROPONINI <0.03   ------------------------------------------------------------------------------------------------------------------  RADIOLOGY:  No results found.  ASSESSMENT AND PLAN:   * Ac on ch systolic CHF   IV lasix, I/o monitor,  Fluid restriction, Cardio consult appreciated.   Having diuresis, still significant edema.    nephro tried albumin one  dose.   As per cardio , there may be mainly lymphatic edema on legs.   Called Vascular consult and advised to keep pt in bed sleeping with legs at heart level.   Vascular is also trying another kind of leg wrap.  * Ac renal failure   Likely due to cardiac failure- and decreased circulation.   IV lasix, monitor.   Nephrology on case.   Renal function improving gradually.   Monitor.  * Hyperkalemia   Now normal after lasix.  * Hypertension   Metoprolol, lasix  decreased metoprolol, as BP running on lower side.   Bp runs further low with amiodarone.  * s//p valvular replacement   On coumadin, INR therapeutic, continue.   Pharmacy managing dosing.  * a fib   As per cardiologist, this may be playing a role in CHF, so may need better control per cardio.  added amio.   If not help much then she may need cardioversion, but pt denies for that.  * lymphedema   Appreciated vascular help.  All the records are reviewed and case discussed with Care Management/Social Workerr. Management plans discussed with the patient, family and they are in agreement.  CODE STATUS: Full.  TOTAL TIME TAKING CARE OF THIS PATIENT: 35 minutes.   POSSIBLE D/C IN 2-3 DAYS, DEPENDING ON CLINICAL CONDITION.   [name] M.D on 10/05/2015   Between 7am to 6pm - Pager - [phone]  After 6pm go to [email or web address] - password [email or web address]  Office  [phone]  CC: Primary care physician; Rusty Aus, MD  Note: This dictation was prepared with Dragon dictation along with smaller phrase technology. Any transcriptional errors that result from this process are unintentional.

## 2015-10-05 NOTE — Progress Notes (Signed)
ANTICOAGULATION CONSULT NOTE - FOLLOW UP   Pharmacy Consult for Warfarin Indication: Chronic Afib with Mitral Valve Replacement  Allergies  Allergen Reactions  . Ace Inhibitors     Other reaction(s): Cough  . Hydrocodone Itching and Nausea Only  . Mercury     Other reaction(s): Unknown  . Nickel   . Silver Dermatitis    Severe itching  . Cephalexin Rash  . Penicillins Rash    Patient Measurements: Height: 5' 8.5" (174 cm) Weight: 182 lb 12.8 oz (82.918 kg) IBW/kg (Calculated) : 65.05 Heparin Dosing Weight:   Vital Signs: Temp: 98.2 F (36.8 C) (04/06 0433) Temp Source: Oral (04/06 0433) BP: 84/49 mmHg (04/06 0433) Pulse Rate: 116 (04/06 0433)  Labs:  Recent Labs  10/03/15 0453 10/04/15 0502 10/05/15 0450  LABPROT 27.3* 26.7* 29.9*  INR 2.58 2.50 2.91  CREATININE 1.61* 1.57*  --     Estimated Creatinine Clearance: 33.7 mL/min (by C-G formula based on Cr of 1.57).   Medical History: Past Medical History  Diagnosis Date  . Shortness of breath dyspnea   . Hypertension   . Chronic combined systolic (congestive) and diastolic (congestive) heart failure (HCC)     a. EF 25% by echo in 08/2015 b. RHC in 08/2015 showed normal filling pressures  . GERD (gastroesophageal reflux disease)   . Arthritis   . Chronic atrial fibrillation ([place])     a. On Coumadin  . S/P MVR (mitral valve replacement)     a. MVR 1994 b. redo MVR in 08/2014 - on Coumadin    Medications:  Scheduled:  . amiodarone  400 mg Oral BID  . aspirin EC  81 mg Oral Daily  . furosemide  40 mg Intravenous BID  . metoprolol succinate  50 mg Oral Daily  . multivitamin with minerals  1 tablet Oral Daily  . pantoprazole  40 mg Oral Daily  . warfarin  4 mg Oral ONCE-1800  . Warfarin - Pharmacist Dosing Inpatient   Does not apply q1800    Assessment: 81 yo admitted with SOB, pulmonary edema, CHF.  Hx chronic atrial fibrillation on Coumadin, mitral valve replacement. Warfarin dose at home per PTA  meds:  5 mg Mon, Tues, Thur, Sat,Sun  And 7.5 mg  Wed, Fri.  3/30  INR 2.79 3/31 INR 3.13 4/1:  INR: 3.17 4/2: INR: 3.13 4/3: INR: 2.70 4/4: INR: 2.58 4/5: INR: 2.50 4/6: INR: 2.91  Goal of Therapy:  INR 2.5-3.5 Monitor platelets by anticoagulation protocol: Yes   Plan:  INR is within therapeutic range for patient. Patient was started on Amiodarone 400mg  PO bid on 4/3. This will likely cause an increase in INR. Will need to monitor INR closely for next several days. Will order dose of 5 mg po once for today and reassess dosing needs with INR in AM. Will follow up on daily INR labs.  Will check CBC today.  Murrell Converse, PharmD Clinical Pharmacist 10/05/2015

## 2015-10-06 LAB — BASIC METABOLIC PANEL
Anion gap: 7 (ref 5–15)
BUN: 48 mg/dL — AB (ref 6–20)
CALCIUM: 9.2 mg/dL (ref 8.9–10.3)
CO2: 25 mmol/L (ref 22–32)
CREATININE: 1.67 mg/dL — AB (ref 0.44–1.00)
Chloride: 100 mmol/L — ABNORMAL LOW (ref 101–111)
GFR calc Af Amer: 33 mL/min — ABNORMAL LOW (ref >60)
GFR calc non Af Amer: 28 mL/min — ABNORMAL LOW (ref >60)
GLUCOSE: 112 mg/dL — AB (ref 65–99)
Potassium: 3.9 mmol/L (ref 3.5–5.1)
Sodium: 132 mmol/L — ABNORMAL LOW (ref 135–145)

## 2015-10-06 LAB — PROTIME-INR
INR: 3.05
PROTHROMBIN TIME: 31 s — AB (ref 11.4–15.0)

## 2015-10-06 MED ORDER — WARFARIN SODIUM 1 MG PO TABS: 3.0000 mg | ORAL_TABLET | Freq: Once | ORAL | Status: DC

## 2015-10-06 MED ORDER — METOPROLOL SUCCINATE ER 25 MG PO TB24
25.0000 mg | ORAL_TABLET | Freq: Every day | ORAL | Status: DC
  Administered 2015-10-07 – 2015-10-10 (×4): 25 mg via ORAL
  Filled 2015-10-06 (×5): qty 1

## 2015-10-06 MED ORDER — WARFARIN SODIUM 5 MG PO TABS
5.0000 mg | ORAL_TABLET | Freq: Once | ORAL | Status: AC
  Administered 2015-10-06: 5 mg via ORAL
  Filled 2015-10-06: qty 1

## 2015-10-06 NOTE — Progress Notes (Signed)
Wound to left lower leg cleansed with NS, telfa, abd pads wrapped with guaze.

## 2015-10-06 NOTE — Care Management Important Message (Signed)
Important Message  Patient Details  Name: NATALLY HELLENBRAND MRN: [id number] Date of Birth: [birth year]   Medicare Important Message Given:  Yes    Juliann Pulse A Yehonatan Grandison 10/06/2015, 11:21 AM

## 2015-10-06 NOTE — Care Management (Signed)
Case discussed with attending. He does not feel patient is medically stable for discharge today due to the BP, HR edema and wound care she is in need of. Cardioversion planned for Monday or Tuesday.

## 2015-10-06 NOTE — Progress Notes (Signed)
Hospital Problem List     Active Problems:   Long term current use of anticoagulant therapy   Atrial fibrillation with rapid ventricular response (HCC)   ARF (acute renal failure) (HCC)   Acute CHF (congestive heart failure) (HCC)   Systolic and diastolic CHF, acute on chronic (HCC)   S/P MVR (mitral valve replacement)   Lymphedema   Chronic kidney disease (CKD)   Bilateral leg edema    Patient Profile:   Primary Cardiologist: Dr. Mina Marble - [facility]  81 yo female w/ PMH of chronic combined systolic and diastolic CHF (EF 123456 by echo in 08/2015), s/p mechanical MVR (1994, redo MVR in 08/2014), chronic atrial fibrillation (on Coumadin), HLD, and Stage 3 CKD who presented to [facility] on 09/28/2015 from her PCP's office for worsening lower extremity edema and shortness of breath.  Subjective   Sitting in bed. Reports having left leg pain. Mild palpitations. Denies any shortness of breath. Says she now wants a cardioversion prior to discharge.   Inpatient Medications    . acetaminophen  500 mg Oral TID  . amiodarone  400 mg Oral BID  . aspirin EC  81 mg Oral Daily  . furosemide  40 mg Oral Daily  . metoprolol succinate  50 mg Oral Daily  . multivitamin with minerals  1 tablet Oral Daily  . pantoprazole  40 mg Oral Daily  . warfarin  3 mg Oral ONCE-1800  . Warfarin - Pharmacist Dosing Inpatient   Does not apply q1800    Vital Signs    Filed Vitals:   10/05/15 1113 10/05/15 2023 10/06/15 0451 10/06/15 0814  BP: 87/50 94/59 98/64  83/62  Pulse: 117 110 42 129  Temp: 97.9 F (36.6 C) 97.9 F (36.6 C) 98.3 F (36.8 C) 98.2 F (36.8 C)  TempSrc: Oral Oral Oral Oral  Resp: 21 16 16 16   Height:      Weight:   183 lb 9.6 oz (83.28 kg)   SpO2: 90% 93% 96% 95%    Intake/Output Summary (Last 24 hours) at 10/06/15 1042 Last data filed at 10/06/15 1000  Gross per 24 hour  Intake    720 ml  Output    700 ml  Net     20 ml   Filed Weights   10/04/15 1300 10/05/15 0500 10/06/15 0451    Weight: 182 lb 3.2 oz (82.645 kg) 182 lb 12.8 oz (82.918 kg) 183 lb 9.6 oz (83.28 kg)    Physical Exam    General: Well developed, well nourished, female appearing in no acute distress. Head: Normocephalic, atraumatic.  Neck: Supple without bruits, JVD not elevated. Lungs:  Resp regular and unlabored, CTA without wheezing or rales. Heart: Irregularly irregular, S1, S2, no S3, S4, or murmur; no rub. Abdomen: Soft, non-tender, non-distended with normoactive bowel sounds. No hepatomegaly. No rebound/guarding. No obvious abdominal masses. Extremities: No clubbing, cyanosis, 2+ edema bilaterally. Distal pedal pulses are 2+ bilaterally. Neuro: Alert and oriented X 3. Moves all extremities spontaneously. Psych: Normal affect.  Labs    CBC  Recent Labs  10/05/15 0450  WBC 5.0  HGB 10.6*  HCT 32.8*  MCV 81.4  PLT [id number]*   Basic Metabolic Panel  Recent Labs  10/05/15 0450 10/06/15 0538  NA 133* 132*  K 3.8 3.9  CL 100* 100*  CO2 28 25  GLUCOSE 114* 112*  BUN 40* 48*  CREATININE 1.66* 1.67*  CALCIUM 8.8* 9.2    Telemetry    Atrial fibrillation, HR in  90's - 110's.   Cardiac Studies and Radiology    Dg Chest 2 View: 09/28/2015  CLINICAL DATA:  81 year old female with increased lower extremity swelling, shortness of breath for 1 day. Initial encounter. EXAM: CHEST  2 VIEW COMPARISON:  Chest radiographs 03/24/2012. FINDINGS: Larger lung volumes with resolved small pleural effusions present in 2013. Chronic cardiomegaly appears stable. Sequelae of cardiac valve replacement and median sternotomy. Other mediastinal contours are within normal limits. Visualized tracheal air column is within normal limits. No pneumothorax. Pulmonary vascular congestion without overt edema. No confluent pulmonary opacity. Osteopenia with multilevel chronic appearing thoracic compression fractures. No acute osseous abnormality identified. IMPRESSION: Cardiomegaly and pulmonary vascular congestion without  overt edema. Electronically Signed   By: Genevie Ann M.D.   On: 09/28/2015 13:13   US Venous Img Lower Bilateral: 10/02/2015  CLINICAL DATA:  Lower extremity swelling. EXAM: BILATERAL LOWER EXTREMITY VENOUS DOPPLER ULTRASOUND TECHNIQUE: Gray-scale sonography with graded compression, as well as color Doppler and duplex ultrasound were performed to evaluate the lower extremity deep venous systems from the level of the common femoral vein and including the common femoral, femoral, profunda femoral, popliteal and calf veins including the posterior tibial, peroneal and gastrocnemius veins when visible. The superficial great saphenous vein was also interrogated. Spectral Doppler was utilized to evaluate flow at rest and with distal augmentation maneuvers in the common femoral, femoral and popliteal veins. COMPARISON:  02/29/2012. FINDINGS: RIGHT LOWER EXTREMITY Common Femoral Vein: No evidence of thrombus. Normal compressibility, respiratory phasicity and response to augmentation. Saphenofemoral Junction: No evidence of thrombus. Normal compressibility and flow on color Doppler imaging. Profunda Femoral Vein: No evidence of thrombus. Normal compressibility and flow on color Doppler imaging. Femoral Vein: No evidence of thrombus. Normal compressibility, respiratory phasicity and response to augmentation. Popliteal Vein: No evidence of thrombus. Normal compressibility, respiratory phasicity and response to augmentation. Calf Veins: No evidence of thrombus. Normal compressibility and flow on color Doppler imaging. Superficial Great Saphenous Vein: No evidence of thrombus. Normal compressibility and flow on color Doppler imaging. Other Findings:  None. LEFT LOWER EXTREMITY Common Femoral Vein: No evidence of thrombus. Normal compressibility, respiratory phasicity and response to augmentation. Saphenofemoral Junction: No evidence of thrombus. Normal compressibility and flow on color Doppler imaging. Profunda Femoral Vein: No  evidence of thrombus. Normal compressibility and flow on color Doppler imaging. Femoral Vein: No evidence of thrombus. Normal compressibility, respiratory phasicity and response to augmentation. Popliteal Vein: No evidence of thrombus. Normal compressibility, respiratory phasicity and response to augmentation. Calf Veins: No evidence of thrombus. Normal compressibility and flow on color Doppler imaging. Superficial Great Saphenous Vein: No evidence of thrombus. Normal compressibility and flow on color Doppler imaging. Other Findings:  None. IMPRESSION: No evidence of deep venous thrombosis. Electronically Signed   By: Marcello Moores  Register   On: 10/02/2015 12:20    Assessment & Plan    1. Persistent Afib with RVR - Rate control is limited by low blood pressure. Would benefit from attempt at restoration of sinus rhythm. - This patients CHA2DS2-VASc Score and unadjusted Ischemic Stroke Rate (% per year) is equal to 7.2 % stroke rate/year from a score of 5(CHF, HTN, Female, Age (2)). Continue Coumadin for anticoagulation. - continue Amiodarone 400mg  BID. Toprol-XL has been held multiple times secondary to hypotension. Will reduce dose from 50mg  to 25mg  daily.  - the initial plan was to have a cardioversion today but the patient changed her mind and wanted to wait to have the procedure as an outpatient. In talking  with her today, she wants it performed while inpatient. She was informed this would have to be next Monday or Tuesday pending availability. The family is having a meeting with Dr. Anselm Jungling today which will hopefully lead to a full family discussion regarding the procedure for her son, daughter, and the patient herself have expressed different thoughts about the procedure.   2. Acute on chronic combined CHF - Her leg edema seems to be due to lymphedema. Avoid aggressive diuresis. - Diuresis has been limited by hypotension. Net output of -4.9L.  - switched to PO Lasix 40mg  daily.  3. Acute on CKD  stage III - Renal function improved today at 1.67.   4. Bilateral LE edema - Multifactorial with CHF and lymphedema. Though felt to be more lymphedema based on RHC at [facility]. - Continue PO Lasix and wraps/unaboots - Vascular following.  5. Mechanical mitral valve - mechanical valve replacement in 1994, redo MVR in 08/2014 - Continue warfarin for anticoagulation  Signed, Erma Heritage , PA-C 10:42 AM 10/06/2015 Pager: [phone]

## 2015-10-06 NOTE — Care Management (Signed)
Discussed discharge plan with daughter. Daughter made aware of PT recommendations of home with no home health or (SNF). She is requesting a hospital bed per Dr. Dutch Gray recommendations. Will request order be place for hospital bed and have delivered Monday or Tuesday prior to discharge.

## 2015-10-06 NOTE — Progress Notes (Signed)
ANTICOAGULATION CONSULT NOTE - FOLLOW UP   Pharmacy Consult for Warfarin Indication: Chronic Afib with Mitral Valve Replacement  Allergies  Allergen Reactions  . Ace Inhibitors     Other reaction(s): Cough  . Hydrocodone Itching and Nausea Only  . Mercury     Other reaction(s): Unknown  . Nickel   . Silver Dermatitis    Severe itching  . Cephalexin Rash  . Penicillins Rash    Patient Measurements: Height: 5' 8.5" (174 cm) Weight: 183 lb 9.6 oz (83.28 kg) IBW/kg (Calculated) : 65.05 Heparin Dosing Weight:   Vital Signs: Temp: 98.1 F (36.7 C) (04/07 1152) Temp Source: Oral (04/07 1152) BP: 99/62 mmHg (04/07 1152) Pulse Rate: 133 (04/07 1152)  Labs:  Recent Labs  10/04/15 0502 10/05/15 0450 10/06/15 0538  HGB  --  10.6*  --   HCT  --  32.8*  --   PLT  --  115*  --   LABPROT 26.7* 29.9* 31.0*  INR 2.50 2.91 3.05  CREATININE 1.57* 1.66* 1.67*    Estimated Creatinine Clearance: 31.7 mL/min (by C-G formula based on Cr of 1.67).   Medical History: Past Medical History  Diagnosis Date  . Shortness of breath dyspnea   . Hypertension   . Chronic combined systolic (congestive) and diastolic (congestive) heart failure (HCC)     a. EF 25% by echo in 08/2015 b. RHC in 08/2015 showed normal filling pressures  . GERD (gastroesophageal reflux disease)   . Arthritis   . Chronic atrial fibrillation ([place])     a. On Coumadin  . S/P MVR (mitral valve replacement)     a. MVR 1994 b. redo MVR in 08/2014 - on Coumadin    Medications:  Scheduled:  . acetaminophen  500 mg Oral TID  . amiodarone  400 mg Oral BID  . aspirin EC  81 mg Oral Daily  . furosemide  40 mg Oral Daily  . [START ON 10/07/2015] metoprolol succinate  25 mg Oral Daily  . multivitamin with minerals  1 tablet Oral Daily  . pantoprazole  40 mg Oral Daily  . warfarin  3 mg Oral ONCE-1800  . Warfarin - Pharmacist Dosing Inpatient   Does not apply q1800    Assessment: 81 yo admitted with SOB, pulmonary  edema, CHF.  Hx chronic atrial fibrillation on Coumadin, mitral valve replacement. Warfarin dose at home per PTA meds:  5 mg Mon, Tues, Thur, Sat,Sun  And 7.5 mg  Wed, Fri.  Goal of Therapy:  INR 2.5-3.5 Monitor platelets by anticoagulation protocol: Yes   Plan:  INR is within therapeutic range for patient. Patient was started on Amiodarone 400mg  PO bid on 4/3. This will likely cause an increase in INR. Will need to monitor INR closely for next several days. Will order dose of 5 mg po once for today and reassess dosing needs with INR in AM. Will follow up on daily INR labs.  Will check CBC today.  Currie Paris 10/06/2015

## 2015-10-06 NOTE — Progress Notes (Signed)
[facility] at [place] NAME: [name]    MR#:  [id number]  DATE OF BIRTH:  11-Jul-1934  SUBJECTIVE:  CHIEF COMPLAINT:   Chief Complaint  Patient presents with  . Shortness of Breath  . Leg Swelling   Came with severe leg swelling and weight gain, was admitted to [facility] last month- but did not have much diuresis there. Sent in by her PMD. Have good response to IV lasix. Still have significant edema on legs, was having una boots and compressive pumps on legs.  have skin breakdown and blister on left leg. Some improvement on leg edema. Urine output is now properly measured, renal function improving.   Leg edema slightly better after keeping leg elevated or last night  Cardio was discussing an option of cardioversion, but pt is reluctant for that, so finaly decision is made about not to go for that now. BP runs on lower side, on amio for rate control.  REVIEW OF SYSTEMS:  CONSTITUTIONAL: No fever, fatigue or weakness.  EYES: No blurred or double vision.  EARS, NOSE, AND THROAT: No tinnitus or ear pain.  RESPIRATORY: No cough, shortness of breath, wheezing or hemoptysis.  CARDIOVASCULAR: No chest pain, orthopnea,positive for severe edema.  GASTROINTESTINAL: No nausea, vomiting, diarrhea or abdominal pain.  GENITOURINARY: No dysuria, hematuria.  ENDOCRINE: No polyuria, nocturia,  HEMATOLOGY: No anemia, easy bruising or bleeding SKIN: No rash or lesion. MUSCULOSKELETAL: No joint pain or arthritis.   NEUROLOGIC: No tingling, numbness, weakness.  PSYCHIATRY: No anxiety or depression.   ROS  DRUG ALLERGIES:   Allergies  Allergen Reactions  . Ace Inhibitors     Other reaction(s): Cough  . Hydrocodone Itching and Nausea Only  . Mercury     Other reaction(s): Unknown  . Nickel   . Silver Dermatitis    Severe itching  . Cephalexin Rash  . Penicillins Rash    VITALS:  Blood pressure 83/62, pulse 129, temperature 98.2 F  (36.8 C), temperature source Oral, resp. rate 16, height 5' 8.5" (1.74 m), weight 83.28 kg (183 lb 9.6 oz), SpO2 95 %.  PHYSICAL EXAMINATION:  GENERAL:  81 y.o.-year-old patient sitting in chair, with no acute distress.  EYES: Pupils equal, round, reactive to light and accommodation. No scleral icterus. Extraocular muscles intact.  HEENT: Head atraumatic, normocephalic. Oropharynx and nasopharynx clear.  NECK:  Supple, no jugular venous distention. No thyroid enlargement, no tenderness.  LUNGS: Normal breath sounds bilaterally, no wheezing, rales,rhonchi or crepitation. No use of accessory muscles of respiration.  CARDIOVASCULAR: S1, S2 normal. Positive for murmurs, severe b/l leg and thigh edema ABDOMEN: Soft, nontender, nondistended. Bowel sounds present. No organomegaly or mass.  EXTREMITIES: severe pedal edema, no cyanosis, or clubbing. On left leg- skin breakdown with a large 5 cm water filled blister. no redness or warmth on skin. Edema slight better today. NEUROLOGIC: Cranial nerves II through XII are intact. Muscle strength 5/5 in all extremities. Sensation intact. Gait not checked.  PSYCHIATRIC: The patient is alert and oriented x 3.  SKIN: on left leg as above.   Physical Exam LABORATORY PANEL:   CBC  Recent Labs Lab 10/05/15 0450  WBC 5.0  HGB 10.6*  HCT 32.8*  PLT 115*   ------------------------------------------------------------------------------------------------------------------  Chemistries   Recent Labs Lab 10/06/15 0538  NA 132*  K 3.9  CL 100*  CO2 25  GLUCOSE 112*  BUN 48*  CREATININE 1.67*  CALCIUM 9.2   ------------------------------------------------------------------------------------------------------------------  Cardiac Enzymes  No results for input(s): TROPONINI in the last 168 hours. ------------------------------------------------------------------------------------------------------------------  RADIOLOGY:  No results  found.  ASSESSMENT AND PLAN:   * Ac on ch systolic CHF   IV lasix, I/o monitor, Fluid restriction, Cardio consult appreciated.   Having diuresis, still significant edema.    nephro tried albumin one dose.   As per cardio , there may be mainly lymphatic edema on legs.   Called Vascular consult and advised to keep pt in bed sleeping with legs at heart level.   Vascular is also trying another kind of leg wrap.   Some improvement on edema.  * Ac renal failure   Likely due to cardiac failure- and decreased circulation.   IV lasix, monitor.   Nephrology on case.   Renal function improving gradually.   Switched to oral lasix.  * Hyperkalemia   Now normal after lasix.  * Hypertension   Metoprolol, lasix  decreased metoprolol, as BP running on lower side.   Bp runs further low with amiodarone.   Not able to give any metoprolol doses for last 2 days.  * s//p valvular replacement   On coumadin, INR therapeutic, continue.   Pharmacy managing dosing.  * a fib   As per cardiologist, this may be playing a role in CHF, so may need better control per cardio.  added amio.   If not help much then she may need cardioversion, but pt denies for that.   Came to agreement after family and cardio discussion , that will tray with amio only and no cardioversion for next few weeks.  * lymphedema   Appreciated vascular help.   Family is upset , as there is not much improvement in this since admission, they were also upset that even after a long stay at [facility] last month- she was still having same edema, and it is not going down.  All the records are reviewed and case discussed with Care Management/Social Workerr. Management plans discussed with the patient, family and they are in agreement.  CODE STATUS: Full.  TOTAL TIME TAKING CARE OF THIS PATIENT: 35 minutes.   POSSIBLE D/C IN 2-3 DAYS, DEPENDING ON CLINICAL CONDITION.   [name] M.D on 10/06/2015   Between 7am to 6pm - Pager -  [phone]  After 6pm go to [email or web address] - password [email or web address] [facility]  Office  [phone]  CC: Primary care physician; Rusty Aus, MD  Note: This dictation was prepared with Dragon dictation along with smaller phrase technology. Any transcriptional errors that result from this process are unintentional.

## 2015-10-06 NOTE — Plan of Care (Signed)
Problem: Skin Integrity: Goal: Risk for impaired skin integrity will decrease Outcome: Not Progressing Wound to left lower leg, dressing prn at this moment  Problem: Tissue Perfusion: Goal: Risk factors for ineffective tissue perfusion will decrease Outcome: Progressing PO coumadin  Problem: Cardiac: Goal: Ability to achieve and maintain adequate cardiopulmonary perfusion will improve Outcome: Progressing I&O, daily weights, po lasix

## 2015-10-06 NOTE — Progress Notes (Signed)
BP 83/62, spoke with Dr. Marthann Schiller, hold Metoprolol XL this am, give Amiodarone now and give Lasix at lunch time if BP improved. Updated pt.

## 2015-10-06 NOTE — Progress Notes (Signed)
[facility] at [place] NAME: [name]    MR#:  [id number]  DATE OF BIRTH:  1934-12-03  SUBJECTIVE:  CHIEF COMPLAINT:   Chief Complaint  Patient presents with  . Shortness of Breath  . Leg Swelling   Came with severe leg swelling and weight gain, was admitted to [facility] last month- but did not have much diuresis there. Sent in by her PMD. Have good response to IV lasix. Still have significant edema on legs, was having una boots and compressive pumps on legs.  have skin breakdown and blister on left leg. Some improvement on leg edema. Urine output is now properly measured, renal function improving.   Leg edema slightly better after keeping leg elevated or last night  Cardio was discussing an option of cardioversion, but pt is reluctant for that, so finaly decision is made about not to go for that now. BP runs on lower side, on amio for rate control.  today cardio decreased dose of amio and switched to oral lasix, now actually pt said- she wants cardioversion- if needed- she is not scheduled for that so can not be done today. HR is around 130 during my visit.  REVIEW OF SYSTEMS:  CONSTITUTIONAL: No fever, fatigue or weakness.  EYES: No blurred or double vision.  EARS, NOSE, AND THROAT: No tinnitus or ear pain.  RESPIRATORY: No cough, shortness of breath, wheezing or hemoptysis.  CARDIOVASCULAR: No chest pain, orthopnea,positive for severe edema.  GASTROINTESTINAL: No nausea, vomiting, diarrhea or abdominal pain.  GENITOURINARY: No dysuria, hematuria.  ENDOCRINE: No polyuria, nocturia,  HEMATOLOGY: No anemia, easy bruising or bleeding SKIN: No rash or lesion. MUSCULOSKELETAL: No joint pain or arthritis.   NEUROLOGIC: No tingling, numbness, weakness.  PSYCHIATRY: No anxiety or depression.   ROS  DRUG ALLERGIES:   Allergies  Allergen Reactions  . Ace Inhibitors     Other reaction(s): Cough  . Hydrocodone Itching and Nausea  Only  . Mercury     Other reaction(s): Unknown  . Nickel   . Silver Dermatitis    Severe itching  . Cephalexin Rash  . Penicillins Rash    VITALS:  Blood pressure 99/62, pulse 133, temperature 98.1 F (36.7 C), temperature source Oral, resp. rate 18, height 5' 8.5" (1.74 m), weight 83.28 kg (183 lb 9.6 oz), SpO2 91 %.  PHYSICAL EXAMINATION:  GENERAL:  81 y.o.-year-old patient sitting in chair, with no acute distress.  EYES: Pupils equal, round, reactive to light and accommodation. No scleral icterus. Extraocular muscles intact.  HEENT: Head atraumatic, normocephalic. Oropharynx and nasopharynx clear.  NECK:  Supple, no jugular venous distention. No thyroid enlargement, no tenderness.  LUNGS: Normal breath sounds bilaterally, no wheezing, rales,rhonchi or crepitation. No use of accessory muscles of respiration.  CARDIOVASCULAR: S1, S2  tachycardia. Positive for murmurs, severe b/l leg and thigh edema ABDOMEN: Soft, nontender, nondistended. Bowel sounds present. No organomegaly or mass.  EXTREMITIES: severe pedal edema, no cyanosis, or clubbing. On left leg- skin breakdown with a large 5 cm water filled blister. no redness or warmth on skin. Edema slight better today. NEUROLOGIC: Cranial nerves II through XII are intact. Muscle strength 5/5 in all extremities. Sensation intact. Gait not checked.  PSYCHIATRIC: The patient is alert and oriented x 3.  SKIN: on left leg as above.   Physical Exam LABORATORY PANEL:   CBC  Recent Labs Lab 10/05/15 0450  WBC 5.0  HGB 10.6*  HCT 32.8*  PLT 115*   ------------------------------------------------------------------------------------------------------------------  Chemistries   Recent Labs Lab 10/06/15 0538  NA 132*  K 3.9  CL 100*  CO2 25  GLUCOSE 112*  BUN 48*  CREATININE 1.67*  CALCIUM 9.2   ------------------------------------------------------------------------------------------------------------------  Cardiac  Enzymes No results for input(s): TROPONINI in the last 168 hours. ------------------------------------------------------------------------------------------------------------------  RADIOLOGY:  No results found.  ASSESSMENT AND PLAN:   * Ac on ch systolic CHF   IV lasix, I/o monitor, Fluid restriction, Cardio consult appreciated.   Having diuresis, still significant edema.Though better than admission,    nephro tried albumin one dose.   As per cardio , there may be mainly lymphatic edema on legs.   Called Vascular consult and advised to keep pt in bed sleeping with legs at heart level.   Vascular is also trying another kind of leg wrap.   Some improvement on edema.  * Ac renal failure   Likely due to cardiac failure- and decreased circulation.   IV lasix, monitor.   Nephrology on case.   Renal function improving gradually.   Switched to oral lasix.  * Hyperkalemia   Now normal after lasix.  * Hypertension   Metoprolol, lasix  decreased metoprolol, as BP running on lower side.   Bp runs further low with amiodarone.   Not able to give any metoprolol doses for last 2 days.  * s//p valvular replacement   On coumadin, INR therapeutic, continue.   Pharmacy managing dosing.  * a fib   As per cardiologist, this may be playing a role in CHF, so may need better control per cardio.  added amio.   If not help much then she may need cardioversion, but pt denies for that.   Came to agreement after family and cardio discussion , that will tray with amio only and no cardioversion for next few weeks.  pt again changed her mind- and 10/06/15- agreed for cardioversion , if needed. Being Friday-and she is not scheduled for that- will monitor on oral meds, and decide if she need it on Monday?  * lymphedema   Appreciated vascular help.   Family is upset , as there is not much improvement in this since admission, they were also upset that even after a long stay at [facility] last month- she was still  having same edema, and it is not going down.  All the records are reviewed and case discussed with Care Management/Social Workerr. Management plans discussed with the patient, family and they are in agreement.  CODE STATUS: Full.  TOTAL TIME TAKING CARE OF THIS PATIENT: 35 minutes.   POSSIBLE D/C IN 2-3 DAYS, DEPENDING ON CLINICAL CONDITION.   [name] M.D on 10/06/2015   Between 7am to 6pm - Pager - [phone]  After 6pm go to [email or web address] - password [email or web address] [facility]  Office  [phone]  CC: Primary care physician; Rusty Aus, MD  Note: This dictation was prepared with Dragon dictation along with smaller phrase technology. Any transcriptional errors that result from this process are unintentional.

## 2015-10-07 LAB — PROTIME-INR
INR: 2.78
PROTHROMBIN TIME: 28.9 s — AB (ref 11.4–15.0)

## 2015-10-07 MED ORDER — SODIUM CHLORIDE 0.9% FLUSH
3.0000 mL | Freq: Two times a day (BID) | INTRAVENOUS | Status: DC
  Administered 2015-10-07 – 2015-10-08 (×3): 3 mL via INTRAVENOUS

## 2015-10-07 MED ORDER — HYDROCODONE-ACETAMINOPHEN 5-325 MG PO TABS
1.0000 | ORAL_TABLET | Freq: Four times a day (QID) | ORAL | Status: DC | PRN
Start: 2015-10-07 — End: 2015-10-10
  Administered 2015-10-07: 1 via ORAL
  Administered 2015-10-09: 2 via ORAL
  Filled 2015-10-07: qty 1
  Filled 2015-10-07: qty 2

## 2015-10-07 MED ORDER — WARFARIN SODIUM 1 MG PO TABS
6.0000 mg | ORAL_TABLET | Freq: Every day | ORAL | Status: DC
  Administered 2015-10-07 – 2015-10-08 (×2): 6 mg via ORAL
  Filled 2015-10-07 (×2): qty 1

## 2015-10-07 MED ORDER — MORPHINE SULFATE (PF) 2 MG/ML IV SOLN
1.0000 mg | Freq: Three times a day (TID) | INTRAVENOUS | Status: DC | PRN
  Administered 2015-10-07 – 2015-10-08 (×2): 1 mg via INTRAVENOUS
  Filled 2015-10-07 (×2): qty 1

## 2015-10-07 MED ORDER — MORPHINE SULFATE (PF) 2 MG/ML IV SOLN: 1.0000 mg | INTRAVENOUS | Status: DC | PRN

## 2015-10-07 NOTE — Progress Notes (Signed)
Patient: [name] / Admit Date: 09/28/2015 / Date of Encounter: 10/07/2015, 7:29 AM   Subjective: Feels fair. No no acute overnight events. She is for DCCV on Monday morning.   Review of Systems: Review of Systems  Constitutional: Positive for malaise/fatigue. Negative for fever, chills, weight loss and diaphoresis.  HENT: Negative for congestion.   Eyes: Negative for discharge and redness.  Respiratory: Positive for shortness of breath. Negative for cough, hemoptysis, sputum production and wheezing.   Cardiovascular: Positive for leg swelling. Negative for chest pain, palpitations, orthopnea, claudication and PND.  Gastrointestinal: Negative for nausea and vomiting.  Musculoskeletal: Negative for falls.  Skin: Negative for rash.  Neurological: Negative for sensory change, speech change, focal weakness, loss of consciousness and weakness.  Endo/Heme/Allergies: Does not bruise/bleed easily.  Psychiatric/Behavioral: The patient is nervous/anxious.     Objective: Telemetry: Afib with RVR, 120's to 140's Physical Exam: Blood pressure 99/65, pulse 130, temperature 98.1 F (36.7 C), temperature source Oral, resp. rate 16, height 5' 8.5" (1.74 m), weight 184 lb 1.6 oz (83.507 kg), SpO2 90 %. Body mass index is 27.58 kg/(m^2). General: Well developed, well nourished, in no acute distress. Head: Normocephalic, atraumatic, sclera non-icteric, no xanthomas, nares are without discharge. Neck: Negative for carotid bruits. JVP not elevated. Lungs: Clear bilaterally to auscultation without wheezes, rales, or rhonchi. Breathing is unlabored. Heart: Tachycardic, irregularly irregular S1 S2, III/VI systolic murmurs, no rubs, or gallops.  Abdomen: Soft, non-tender, non-distended with normoactive bowel sounds. No rebound/guarding. Extremities: No clubbing or cyanosis.3+ bilateral pitting edema with chronic venous stasis. Left LE ulcer dressed. Neuro: Alert and oriented X 3. Moves all  extremities spontaneously. Psych:  Responds to questions appropriately with a normal affect.   Intake/Output Summary (Last 24 hours) at 10/07/15 0729 Last data filed at 10/07/15 0130  Gross per 24 hour  Intake    240 ml  Output   1100 ml  Net   -860 ml    Inpatient Medications:  . acetaminophen  500 mg Oral TID  . amiodarone  400 mg Oral BID  . aspirin EC  81 mg Oral Daily  . furosemide  40 mg Oral Daily  . metoprolol succinate  25 mg Oral Daily  . multivitamin with minerals  1 tablet Oral Daily  . pantoprazole  40 mg Oral Daily  . Warfarin - Pharmacist Dosing Inpatient   Does not apply q1800   Infusions:    Labs:  Recent Labs  10/05/15 0450 10/06/15 0538  NA 133* 132*  K 3.8 3.9  CL 100* 100*  CO2 28 25  GLUCOSE 114* 112*  BUN 40* 48*  CREATININE 1.66* 1.67*  CALCIUM 8.8* 9.2   No results for input(s): AST, ALT, ALKPHOS, BILITOT, PROT, ALBUMIN in the last 72 hours.  Recent Labs  10/05/15 0450  WBC 5.0  HGB 10.6*  HCT 32.8*  MCV 81.4  PLT 115*   No results for input(s): CKTOTAL, CKMB, TROPONINI in the last 72 hours. Invalid input(s): POCBNP No results for input(s): HGBA1C in the last 72 hours.   Weights: Filed Weights   10/05/15 0500 10/06/15 0451 10/07/15 0432  Weight: 182 lb 12.8 oz (82.918 kg) 183 lb 9.6 oz (83.28 kg) 184 lb 1.6 oz (83.507 kg)     Radiology/Studies:  Dg Chest 2 View  09/28/2015  CLINICAL DATA:  81 year old female with increased lower extremity swelling, shortness of breath for 1 day. Initial encounter. EXAM: CHEST  2 VIEW COMPARISON:  Chest radiographs 03/24/2012.  FINDINGS: Larger lung volumes with resolved small pleural effusions present in 2013. Chronic cardiomegaly appears stable. Sequelae of cardiac valve replacement and median sternotomy. Other mediastinal contours are within normal limits. Visualized tracheal air column is within normal limits. No pneumothorax. Pulmonary vascular congestion without overt edema. No confluent  pulmonary opacity. Osteopenia with multilevel chronic appearing thoracic compression fractures. No acute osseous abnormality identified. IMPRESSION: Cardiomegaly and pulmonary vascular congestion without overt edema. Electronically Signed   By: Genevie Ann M.D.   On: 09/28/2015 13:13   US Venous Img Lower Bilateral  10/02/2015  CLINICAL DATA:  Lower extremity swelling. EXAM: BILATERAL LOWER EXTREMITY VENOUS DOPPLER ULTRASOUND TECHNIQUE: Gray-scale sonography with graded compression, as well as color Doppler and duplex ultrasound were performed to evaluate the lower extremity deep venous systems from the level of the common femoral vein and including the common femoral, femoral, profunda femoral, popliteal and calf veins including the posterior tibial, peroneal and gastrocnemius veins when visible. The superficial great saphenous vein was also interrogated. Spectral Doppler was utilized to evaluate flow at rest and with distal augmentation maneuvers in the common femoral, femoral and popliteal veins. COMPARISON:  02/29/2012. FINDINGS: RIGHT LOWER EXTREMITY Common Femoral Vein: No evidence of thrombus. Normal compressibility, respiratory phasicity and response to augmentation. Saphenofemoral Junction: No evidence of thrombus. Normal compressibility and flow on color Doppler imaging. Profunda Femoral Vein: No evidence of thrombus. Normal compressibility and flow on color Doppler imaging. Femoral Vein: No evidence of thrombus. Normal compressibility, respiratory phasicity and response to augmentation. Popliteal Vein: No evidence of thrombus. Normal compressibility, respiratory phasicity and response to augmentation. Calf Veins: No evidence of thrombus. Normal compressibility and flow on color Doppler imaging. Superficial Great Saphenous Vein: No evidence of thrombus. Normal compressibility and flow on color Doppler imaging. Other Findings:  None. LEFT LOWER EXTREMITY Common Femoral Vein: No evidence of thrombus. Normal  compressibility, respiratory phasicity and response to augmentation. Saphenofemoral Junction: No evidence of thrombus. Normal compressibility and flow on color Doppler imaging. Profunda Femoral Vein: No evidence of thrombus. Normal compressibility and flow on color Doppler imaging. Femoral Vein: No evidence of thrombus. Normal compressibility, respiratory phasicity and response to augmentation. Popliteal Vein: No evidence of thrombus. Normal compressibility, respiratory phasicity and response to augmentation. Calf Veins: No evidence of thrombus. Normal compressibility and flow on color Doppler imaging. Superficial Great Saphenous Vein: No evidence of thrombus. Normal compressibility and flow on color Doppler imaging. Other Findings:  None. IMPRESSION: No evidence of deep venous thrombosis. Electronically Signed   By: Marcello Moores  Register   On: 10/02/2015 12:20     Assessment and Plan   1. Persistent Afib with RVR - Rate control is limited by low blood pressure. Would benefit from attempt at restoration of sinus rhythm. - This patients CHA2DS2-VASc Score and unadjusted Ischemic Stroke Rate (% per year) is equal to 7.2 % stroke rate/year from a score of 5(CHF, HTN, Female, Age (2)). Continue Coumadin for anticoagulation. - continue Amiodarone 400mg  BID. Toprol-XL has been held multiple times secondary to hypotension. Eden Toohey reduce dose from 50mg  to 25mg  daily.  - The initial plan the week prior was to perform inpatient DCCV, however upon rounding on patient on 4/4 this was changed to outpatient by rounding MD. She continued to by tachycardic with HR in the 120-140 range - Ultimately, after many discussions with the patient and her family with several MD's the decision was made to move forward with DCCV on Monday, 10/09/2015 given her persistent poorly controlled HR and cardiomyopathy  2.  Acute on chronic combined CHF - Her leg edema seems to be due to lymphedema. Avoid aggressive diuresis. - Diuresis has been  limited by hypotension. Net output of -7.7L.  - Switched to PO Lasix 40mg  daily.  3. Acute on CKD stage III - Renal function improved on 4/8 at 1.67.   4. Bilateral LE edema - Multifactorial with CHF and lymphedema. Though felt to be more lymphedema based on RHC at [facility]. - Continue PO Lasix and wraps/unaboots - Vascular following.  5. Mechanical mitral valve - Mechanical valve replacement in 1994, redo MVR in 08/2014 - Continue warfarin for anticoagulation  Signed, Christell Faith, PA-C Pager: [phone] 10/07/2015, 7:29 AM  I have seen and examined this patient with Christell Faith.  Agree with above, note added to reflect my findings.  On exam, tachycardic, irregular, lungs clear.  Patient with atrial fibrillation with RVR.  BP low and thus difficult to rate control.  Plan for DCCV on Monday to restore sinus rhythm.  Currently being loaded on amiodarone.  On coumadin for mechanical mitral valve.  INR therapeutic for the last 3 weeks.  Carlisle Torgeson M. Mallisa Alameda MD 10/07/2015 9:03 AM

## 2015-10-07 NOTE — Progress Notes (Signed)
While giving report to night shift, patient stated she can no longer tolerate the wraps. Both wraps removed.

## 2015-10-07 NOTE — Progress Notes (Addendum)
Spoke with [name] about patient's blood pressure. MD stated to hold furosemide and metoprolol this AM, but give amiodarone. Will continue to monitor.  Cardiology is also aware that patient's heart rate stays in the 120's to 130's and there is nothing further they can do right now until she is cardioverted on Monday.
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
## 2015-10-07 NOTE — Progress Notes (Signed)
[facility]  Daily Progress Note   Placed three layer compression wrap to bilateral lower extremities without complication. Placed xeroform over left calf ulceration. Encouraged patient to elevate legs. Patient tolerated procedure without issues. Change in one week or sooner if saturation occurs.   Marcelle Overlie PA-C 10/07/2015 2:31 PM

## 2015-10-07 NOTE — Progress Notes (Signed)
ANTICOAGULATION CONSULT NOTE - FOLLOW UP   Pharmacy Consult for Warfarin Indication: Chronic Afib with Mitral Valve Replacement  Allergies  Allergen Reactions  . Ace Inhibitors     Other reaction(s): Cough  . Hydrocodone Itching and Nausea Only  . Mercury     Other reaction(s): Unknown  . Nickel   . Silver Dermatitis    Severe itching  . Cephalexin Rash  . Penicillins Rash    Patient Measurements: Height: 5' 8.5" (174 cm) Weight: 184 lb 1.6 oz (83.507 kg) IBW/kg (Calculated) : 65.05 Heparin Dosing Weight:   Vital Signs: Temp: 98.1 F (36.7 C) (04/08 0432) Temp Source: Oral (04/08 0432) BP: 100/62 mmHg (04/08 1002) Pulse Rate: 117 (04/08 1002)  Labs:  Recent Labs  10/05/15 0450 10/06/15 0538 10/07/15 0649  HGB 10.6*  --   --   HCT 32.8*  --   --   PLT 115*  --   --   LABPROT 29.9* 31.0* 28.9*  INR 2.91 3.05 2.78  CREATININE 1.66* 1.67*  --     Estimated Creatinine Clearance: 31.8 mL/min (by C-G formula based on Cr of 1.67).   Medical History: Past Medical History  Diagnosis Date  . Shortness of breath dyspnea   . Hypertension   . Chronic combined systolic (congestive) and diastolic (congestive) heart failure (HCC)     a. EF 25% by echo in 08/2015 b. RHC in 08/2015 showed normal filling pressures  . GERD (gastroesophageal reflux disease)   . Arthritis   . Chronic atrial fibrillation ([place])     a. On Coumadin  . S/P MVR (mitral valve replacement)     a. MVR 1994 b. redo MVR in 08/2014 - on Coumadin    Medications:  Scheduled:  . acetaminophen  500 mg Oral TID  . amiodarone  400 mg Oral BID  . aspirin EC  81 mg Oral Daily  . furosemide  40 mg Oral Daily  . metoprolol succinate  25 mg Oral Daily  . multivitamin with minerals  1 tablet Oral Daily  . pantoprazole  40 mg Oral Daily  . warfarin  6 mg Oral q1800  . Warfarin - Pharmacist Dosing Inpatient   Does not apply q1800    Assessment: 81 yo admitted with SOB, pulmonary edema, CHF.  Hx chronic  atrial fibrillation on Coumadin, mitral valve replacement. Warfarin dose at home per PTA meds:  5 mg Mon, Tues, Thur, Sat,Sun  And 7.5 mg  Wed, Fri.  4/5 INR 2.50  Warfarin 7.5 mg 4/6 INR 2.91  Warfarin 5 mg 4/7 INR 3.05  Warfarin 5 mg 4/8 INR 2.78    Goal of Therapy:  INR 2.5-3.5 Monitor platelets by anticoagulation protocol: Yes   Plan:  INR is within therapeutic range for patient. Patient was started on Amiodarone 400mg  PO bid on 4/3. This will likely cause an increase in INR. Will need to monitor INR closely for next several days.  4/8: Will order Warfarin 6 mg daily. Will follow up on daily INR labs.    Chinita Greenland PharmD Clinical Pharmacist 10/07/2015 10:34 AM

## 2015-10-07 NOTE — Progress Notes (Addendum)
Patient did not complain of any discomfort today until new 4 layer leg wraps were placed. Patient states she also had this discomfort with una boots. Her left leg has sharp intermittent pain where her wound is under the dressing. Offered multiple times to take the wraps off and patient stated she wanted to wait it out. Patient's toes and the portion of her foot that is out of the dressing have started turning purple since application of wraps. Feet are still warm with capillary refill, but unable to palpate pulses due to placement of wraps. Both Dr. Tressia Miners and PA from vascular paged. Kalis[place] changed pain medications. Vascular stated if patient cannot tolerate them to take them off and keep legs elevated as much as possible. Patient is now lying in bed with her feet elevated and stated she would like to keep them on and she will let staff know if she cannot tolerate them anymore. Will continue to closely monitor.

## 2015-10-07 NOTE — Progress Notes (Signed)
[facility]  ROUNDING NOTE   Subjective:  Renal function about the same. Cr currently 1.67. Daughter at bedside.   Objective:  Vital signs in last 24 hours:  Temp:  [98 F (36.7 C)-98.3 F (36.8 C)] 98.3 F (36.8 C) (04/08 1038) Pulse Rate:  [45-133] 45 (04/08 1038) Resp:  [16-22] 22 (04/08 1038) BP: (99-110)/(39-73) 104/39 mmHg (04/08 1038) SpO2:  [90 %-100 %] 92 % (04/08 1038) Weight:  [83.507 kg (184 lb 1.6 oz)] 83.507 kg (184 lb 1.6 oz) (04/08 0432)  Weight change: 0.227 kg (8 oz) Filed Weights   10/05/15 0500 10/06/15 0451 10/07/15 0432  Weight: 82.918 kg (182 lb 12.8 oz) 83.28 kg (183 lb 9.6 oz) 83.507 kg (184 lb 1.6 oz)    Intake/Output: I/O last 3 completed shifts: In: 240 [P.O.:240] Out: 1600 [Urine:1600]   Intake/Output this shift:  Total I/O In: 480 [P.O.:480] Out: 300 [Urine:300]  Physical Exam: General: NAD, sitting in chair  Head: Normocephalic, atraumatic. Moist oral mucosal membranes  Eyes: Anicteric,   Neck: Supple, trachea midline  Lungs:  Clear bilaterally  Heart: Irregular, A Fib  Abdomen:  Soft, nontender, BS present  Extremities:  ++ peripheral edema upto mid shins.   Neurologic: Nonfocal, moving all four extremities  Skin: No acute lesions       Basic Metabolic Panel:  Recent Labs Lab 10/02/15 0506 10/03/15 0453 10/04/15 0502 10/05/15 0450 10/06/15 0538  NA 134* 135 135 133* 132*  K 3.6 3.9 3.7 3.8 3.9  CL 98* 99* 102 100* 100*  CO2 28 27 27 28 25   GLUCOSE 111* 121* 115* 114* 112*  BUN 55* 53* 47* 40* 48*  CREATININE 1.75* 1.61* 1.57* 1.66* 1.67*  CALCIUM 9.3 9.6 9.2 8.8* 9.2    Liver Function Tests:  Recent Labs Lab 10/02/15 0506  ALBUMIN 4.0   No results for input(s): LIPASE, AMYLASE in the last 168 hours. No results for input(s): AMMONIA in the last 168 hours.  CBC:  Recent Labs Lab 10/05/15 0450  WBC 5.0  HGB 10.6*  HCT 32.8*  MCV 81.4  PLT 115*    Cardiac Enzymes: No results for  input(s): CKTOTAL, CKMB, CKMBINDEX, TROPONINI in the last 168 hours.  BNP: Invalid input(s): POCBNP  CBG:  Recent Labs Lab 10/02/15 0752  GLUCAP 110*    Microbiology: Results for orders placed or performed in visit on 03/22/12  Urine culture     Status: None   Collection Time: 03/22/12 11:51 AM  Result Value Ref Range Status   Micro Text Report   Final       SOURCE: CC    COMMENT                   MIXED BACTERIAL ORGANISMS   COMMENT                   RESULTS SUGGESTIVE OF CONTAMINATION   ANTIBIOTIC                                                      Culture, blood (single)     Status: None   Collection Time: 03/22/12 12:03 PM  Result Value Ref Range Status   Micro Text Report   Final       COMMENT  NO GROWTH AEROBICALLY/ANAEROBICALLY IN 5 DAYS   ANTIBIOTIC                                                      Culture, blood (single)     Status: None   Collection Time: 03/22/12 12:08 PM  Result Value Ref Range Status   Micro Text Report   Final       COMMENT                   NO GROWTH AEROBICALLY/ANAEROBICALLY IN 5 DAYS   ANTIBIOTIC                                                      Culture, blood (single)     Status: None   Collection Time: 03/24/12 11:09 AM  Result Value Ref Range Status   Micro Text Report   Final       COMMENT                   NO GROWTH AEROBICALLY/ANAEROBICALLY IN 5 DAYS   ANTIBIOTIC                                                        Coagulation Studies:  Recent Labs  10/05/15 0450 10/06/15 0538 10/07/15 0649  LABPROT 29.9* 31.0* 28.9*  INR 2.91 3.05 2.78    Urinalysis: No results for input(s): COLORURINE, LABSPEC, PHURINE, GLUCOSEU, HGBUR, BILIRUBINUR, KETONESUR, PROTEINUR, UROBILINOGEN, NITRITE, LEUKOCYTESUR in the last 72 hours.  Invalid input(s): APPERANCEUR    Imaging: No results found.   Medications:     . acetaminophen  500 mg Oral TID  . amiodarone  400 mg Oral BID  . aspirin EC   81 mg Oral Daily  . furosemide  40 mg Oral Daily  . metoprolol succinate  25 mg Oral Daily  . multivitamin with minerals  1 tablet Oral Daily  . pantoprazole  40 mg Oral Daily  . warfarin  6 mg Oral q1800  . Warfarin - Pharmacist Dosing Inpatient   Does not apply q1800   HYDROcodone-acetaminophen, vitamin C  Assessment/ Plan:  [name] is a 81 y.o. white female with hypertension, systolic and diastolic congestive heart failure, mitral valve replacement 09/05/15, hyperlipidemia, atrial fibrillation, anemia, who was admitted to [facility] on 09/28/2015 for Acute on chronic congestive heart failure, unspecified congestive heart failure type ([place]) [I50.9]   1. Acute Renal Failure with hyperkalemia on chronic kidney disease stage III: baseline creatinine of 1.09 eGFR of 48 04/30/15   Likely ARF from acute cardiorenal syndrome - Outpatient, patient was taking Torsemide 40 BID - Renal function slightly worse than her outpt baseline.  Overall better clinically, continue furosemide for now.  2. LE edema with acute exacerbation of systolic and diastolic congestive heart failure with new mitral valve replacement - blood pressure low normal - LV EF: 40% - 45%; Systolic pressure was mildly elevated. PA  peak pressure: 43 mm Hg  - continue lasix  daily for now.  3. Hyponatremia: due to hypervolemia.  Sodium currently 132, will continue to monitor during hospitalization.   4. A Fib - continue amiodarone.    LOS: [place], Jazmaine Fuelling 4/8/20173:44 PM

## 2015-10-07 NOTE — Progress Notes (Signed)
Now that patient's legs have been elevated, coloring to her feet have improved. Still slightly purple, but not near as dark. Will give patient morphine and see how that helps. Informed patient again that we could take them off at any time. Will continue to monitor.

## 2015-10-08 LAB — BASIC METABOLIC PANEL
ANION GAP: 4 — AB (ref 5–15)
BUN: 36 mg/dL — ABNORMAL HIGH (ref 6–20)
CALCIUM: 8.9 mg/dL (ref 8.9–10.3)
CO2: 28 mmol/L (ref 22–32)
Chloride: 101 mmol/L (ref 101–111)
Creatinine, Ser: 1.41 mg/dL — ABNORMAL HIGH (ref 0.44–1.00)
GFR, EST AFRICAN AMERICAN: 40 mL/min — AB (ref >60)
GFR, EST NON AFRICAN AMERICAN: 35 mL/min — AB (ref >60)
GLUCOSE: 124 mg/dL — AB (ref 65–99)
Potassium: 4.2 mmol/L (ref 3.5–5.1)
SODIUM: 133 mmol/L — AB (ref 135–145)

## 2015-10-08 LAB — CBC
HCT: 34.6 % — ABNORMAL LOW (ref 35.0–47.0)
Hemoglobin: 11.1 g/dL — ABNORMAL LOW (ref 12.0–16.0)
MCH: 26.4 pg (ref 26.0–34.0)
MCHC: 32.2 g/dL (ref 32.0–36.0)
MCV: 82 fL (ref 80.0–100.0)
Platelets: 149 10*3/uL — ABNORMAL LOW (ref 150–440)
RBC: 4.22 MIL/uL (ref 3.80–5.20)
RDW: 20.5 % — AB (ref 11.5–14.5)
WBC: 5.4 10*3/uL (ref 3.6–11.0)

## 2015-10-08 LAB — PROTIME-INR
INR: 3.08
PROTHROMBIN TIME: 31.2 s — AB (ref 11.4–15.0)

## 2015-10-08 MED ORDER — FUROSEMIDE 10 MG/ML IJ SOLN
40.0000 mg | Freq: Once | INTRAMUSCULAR | Status: AC
  Administered 2015-10-08: 40 mg via INTRAVENOUS
  Filled 2015-10-08: qty 4

## 2015-10-08 MED ORDER — SODIUM CHLORIDE 0.9% FLUSH: 3.0000 mL | INTRAVENOUS | Status: DC | PRN
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
## 2015-10-08 NOTE — Progress Notes (Signed)
   10/08/15 1700  Clinical Encounter Type  Visited With Patient  Visit Type Follow-up  Referral From Nurse  Spiritual Encounters  Spiritual Needs Prayer  Patient requested visit from [place]. Chaplain followed up with patient twice and offered support and prayer.

## 2015-10-08 NOTE — Progress Notes (Signed)
ANTICOAGULATION CONSULT NOTE - FOLLOW UP   Pharmacy Consult for Warfarin Indication: Chronic Afib with Mitral Valve Replacement  Allergies  Allergen Reactions  . Ace Inhibitors     Other reaction(s): Cough  . Hydrocodone Itching and Nausea Only  . Mercury     Other reaction(s): Unknown  . Nickel   . Silver Dermatitis    Severe itching  . Cephalexin Rash  . Penicillins Rash    Patient Measurements: Height: 5' 8.5" (174 cm) Weight: 183 lb 8 oz (83.235 kg) IBW/kg (Calculated) : 65.05 Heparin Dosing Weight:   Vital Signs: Temp: 98.3 F (36.8 C) (04/09 0419) Temp Source: Oral (04/09 0419) BP: 95/69 mmHg (04/09 0419) Pulse Rate: 99 (04/09 0503)  Labs:  Recent Labs  10/06/15 0538 10/07/15 0649 10/08/15 0432  HGB  --   --  11.1*  HCT  --   --  34.6*  PLT  --   --  149*  LABPROT 31.0* 28.9* 31.2*  INR 3.05 2.78 3.08  CREATININE 1.67*  --  1.41*    Estimated Creatinine Clearance: 37.5 mL/min (by C-G formula based on Cr of 1.41).   Medical History: Past Medical History  Diagnosis Date  . Shortness of breath dyspnea   . Hypertension   . Chronic combined systolic (congestive) and diastolic (congestive) heart failure (HCC)     a. EF 25% by echo in 08/2015 b. RHC in 08/2015 showed normal filling pressures  . GERD (gastroesophageal reflux disease)   . Arthritis   . Chronic atrial fibrillation ([place])     a. On Coumadin  . S/P MVR (mitral valve replacement)     a. MVR 1994 b. redo MVR in 08/2014 - on Coumadin    Medications:  Scheduled:  . acetaminophen  500 mg Oral TID  . amiodarone  400 mg Oral BID  . aspirin EC  81 mg Oral Daily  . furosemide  40 mg Intravenous Once  . furosemide  40 mg Oral Daily  . metoprolol succinate  25 mg Oral Daily  . multivitamin with minerals  1 tablet Oral Daily  . pantoprazole  40 mg Oral Daily  . sodium chloride flush  3 mL Intravenous Q12H  . warfarin  6 mg Oral q1800  . Warfarin - Pharmacist Dosing Inpatient   Does not apply  q1800    Assessment: 81 yo admitted with SOB, pulmonary edema, CHF.  Hx chronic atrial fibrillation on Coumadin, mitral valve replacement. Warfarin dose at home per PTA meds:  5 mg Mon, Tues, Thur, Sat,Sun  And 7.5 mg  Wed, Fri.  4/5 INR 2.50  Warfarin 7.5 mg 4/6 INR 2.91  Warfarin 5 mg 4/7 INR 3.05  Warfarin 5 mg 4/8 INR 2.78  Warfarin 6 mg 4/9 INR 3.08     Goal of Therapy:  INR 2.5-3.5 Monitor platelets by anticoagulation protocol: Yes   Plan:  INR is within therapeutic range for patient. Patient was started on Amiodarone 400mg  PO bid on 4/3. This will likely cause an increase in INR. Will need to monitor INR closely for next several days.  4/9: Will continue Warfarin 6 mg daily. Will follow up on daily INR labs.    Chinita Greenland PharmD Clinical Pharmacist 10/08/2015 8:50 AM

## 2015-10-08 NOTE — Progress Notes (Signed)
[facility]  ROUNDING NOTE   Subjective:  Creatinine down to 1.41, BUN down to 36. Lower extremity edema has also improved.  patient resting comfortably in bed.   Objective:  Vital signs in last 24 hours:  Temp:  [98.1 F (36.7 C)-98.6 F (37 C)] 98.1 F (36.7 C) (04/09 1142) Pulse Rate:  [63-137] 63 (04/09 1142) Resp:  [17-20] 17 (04/09 1142) BP: (90-107)/(57-69) 90/59 mmHg (04/09 1300) SpO2:  [87 %-95 %] 93 % (04/09 1142) Weight:  [83.235 kg (183 lb 8 oz)] 83.235 kg (183 lb 8 oz) (04/09 0419)  Weight change: -0.272 kg (-9.6 oz) Filed Weights   10/06/15 0451 10/07/15 0432 10/08/15 0419  Weight: 83.28 kg (183 lb 9.6 oz) 83.507 kg (184 lb 1.6 oz) 83.235 kg (183 lb 8 oz)    Intake/Output: I/O last 3 completed shifts: In: 480 [P.O.:480] Out: 1400 [Urine:1400]   Intake/Output this shift:  Total I/O In: 240 [P.O.:240] Out: 300 [Urine:300]  Physical Exam: General: NAD, sitting in chair  Head: Normocephalic, atraumatic. Moist oral mucosal membranes  Eyes: Anicteric,   Neck: Supple, trachea midline  Lungs:  Clear bilaterally  Heart: Irregular, A Fib  Abdomen:  Soft, nontender, BS present  Extremities: 1+ b/l LE edema  Neurologic: Nonfocal, moving all four extremities  Skin: No acute lesions       Basic Metabolic Panel:  Recent Labs Lab 10/03/15 0453 10/04/15 0502 10/05/15 0450 10/06/15 0538 10/08/15 0432  NA 135 135 133* 132* 133*  K 3.9 3.7 3.8 3.9 4.2  CL 99* 102 100* 100* 101  CO2 27 27 28 25 28   GLUCOSE 121* 115* 114* 112* 124*  BUN 53* 47* 40* 48* 36*  CREATININE 1.61* 1.57* 1.66* 1.67* 1.41*  CALCIUM 9.6 9.2 8.8* 9.2 8.9    Liver Function Tests:  Recent Labs Lab 10/02/15 0506  ALBUMIN 4.0   No results for input(s): LIPASE, AMYLASE in the last 168 hours. No results for input(s): AMMONIA in the last 168 hours.  CBC:  Recent Labs Lab 10/05/15 0450 10/08/15 0432  WBC 5.0 5.4  HGB 10.6* 11.1*  HCT 32.8* 34.6*  MCV 81.4  82.0  PLT 115* 149*    Cardiac Enzymes: No results for input(s): CKTOTAL, CKMB, CKMBINDEX, TROPONINI in the last 168 hours.  BNP: Invalid input(s): POCBNP  CBG:  Recent Labs Lab 10/02/15 0752  GLUCAP 110*    Microbiology: Results for orders placed or performed in visit on 03/22/12  Urine culture     Status: None   Collection Time: 03/22/12 11:51 AM  Result Value Ref Range Status   Micro Text Report   Final       SOURCE: CC    COMMENT                   MIXED BACTERIAL ORGANISMS   COMMENT                   RESULTS SUGGESTIVE OF CONTAMINATION   ANTIBIOTIC                                                      Culture, blood (single)     Status: None   Collection Time: 03/22/12 12:03 PM  Result Value Ref Range Status   Micro Text Report   Final  COMMENT                   NO GROWTH AEROBICALLY/ANAEROBICALLY IN 5 DAYS   ANTIBIOTIC                                                      Culture, blood (single)     Status: None   Collection Time: 03/22/12 12:08 PM  Result Value Ref Range Status   Micro Text Report   Final       COMMENT                   NO GROWTH AEROBICALLY/ANAEROBICALLY IN 5 DAYS   ANTIBIOTIC                                                      Culture, blood (single)     Status: None   Collection Time: 03/24/12 11:09 AM  Result Value Ref Range Status   Micro Text Report   Final       COMMENT                   NO GROWTH AEROBICALLY/ANAEROBICALLY IN 5 DAYS   ANTIBIOTIC                                                        Coagulation Studies:  Recent Labs  10/06/15 0538 10/07/15 0649 10/08/15 0432  LABPROT 31.0* 28.9* 31.2*  INR 3.05 2.78 3.08    Urinalysis: No results for input(s): COLORURINE, LABSPEC, PHURINE, GLUCOSEU, HGBUR, BILIRUBINUR, KETONESUR, PROTEINUR, UROBILINOGEN, NITRITE, LEUKOCYTESUR in the last 72 hours.  Invalid input(s): APPERANCEUR    Imaging: No results found.   Medications:     . acetaminophen   500 mg Oral TID  . amiodarone  400 mg Oral BID  . aspirin EC  81 mg Oral Daily  . furosemide  40 mg Oral Daily  . metoprolol succinate  25 mg Oral Daily  . multivitamin with minerals  1 tablet Oral Daily  . pantoprazole  40 mg Oral Daily  . sodium chloride flush  3 mL Intravenous Q12H  . warfarin  6 mg Oral q1800  . Warfarin - Pharmacist Dosing Inpatient   Does not apply q1800   HYDROcodone-acetaminophen, morphine injection, sodium chloride flush, vitamin C  Assessment/ Plan:  [name] is a 81 y.o. white female with hypertension, systolic and diastolic congestive heart failure, mitral valve replacement 09/05/15, hyperlipidemia, atrial fibrillation, anemia, who was admitted to [facility] on 09/28/2015 for Acute on chronic congestive heart failure, unspecified congestive heart failure type ([place]) [I50.9]   1. Acute Renal Failure with hyperkalemia on chronic kidney disease stage III: baseline creatinine of 1.09 eGFR of 48 04/30/15   Likely ARF from acute cardiorenal syndrome - Outpatient, patient was taking Torsemide 40 BID - Renal function has improved. Creatinine down to 1.4 with a BUN of 36. Continue Lasix at 40 mg by mouth daily and continue to monitor renal function.  2. LE edema with acute exacerbation of systolic and diastolic congestive heart failure with new mitral valve replacement - blood pressure low normal - LV EF: 40% - 45%; Systolic pressure was mildly elevated. PA  peak pressure: 43 mm Hg  - Appears well compensated currently. Continue Lasix 40 mg by mouth daily..  3. Hyponatremia: due to hypervolemia.  Sodium up to 133 is a good sign. Continue to monitor.  4. A Fib - continue amiodarone.    LOS: [place], Loralee Weitzman 4/9/20172:48 PM

## 2015-10-08 NOTE — Plan of Care (Signed)
Problem: Physical Regulation: Goal: Ability to maintain clinical measurements within normal limits will improve Outcome: Progressing Rapid heart rate noted earlier in shift.  Scheduled meds given with decreased heart rate noted afterwards  Problem: Activity: Goal: Risk for activity intolerance will decrease Outcome: Progressing Remains in bed with foot of bed elevated.Up out of bed only to void.    Problem: Fluid Volume: Goal: Ability to maintain a balanced intake and output will improve Outcome: Progressing Legs less edematous this morning.  Pt reports less pain since compression dressings removed yesterday.

## 2015-10-08 NOTE — Progress Notes (Signed)
Dr. Tressia Miners paged to make aware of low BP/ pt asymptomatic / will continue to monitor

## 2015-10-08 NOTE — Progress Notes (Signed)
[facility]  Daily Progress Note  Patient unable to tolerate three layer wraps I placed yesterday. Patient has history of being unable to tolerate unna wraps as well. Family present at bedside. We discussed appropriate elevation (heart level or higher) and why she must do this as much as possible. Patient also encouraged to have family member bring her farrow wraps to the hospital to start compression of her lower extremities. Will re-consult wound nurse for left lower extremity calf ulceration as patient will not tolerate unna wraps and will most likely be non-compliant with compression and elevation.   Marcelle Overlie PA-C 10/08/2015 1:40 PM

## 2015-10-08 NOTE — Progress Notes (Signed)
Patient: [name] / Admit Date: 09/28/2015 / Date of Encounter: 10/08/2015, 8:17 AM   Subjective: Lower extremity swelling has nearly resolved overnight upon patient waking up this morning. With some wheezing per her report. She is for DCCV 10/09/15 with Dr. Fletcher Anon, MD. Heart rate is better controlled while at rest, in the 90's, remains in Afib. With minimal movement in the bed HR Tanganika Barradas become tachycardic into the 1-teens to 140's.   Review of Systems: Review of Systems  Constitutional: Positive for malaise/fatigue. Negative for fever, chills, weight loss and diaphoresis.  HENT: Negative for congestion.   Eyes: Negative for discharge and redness.  Respiratory: Positive for shortness of breath and wheezing. Negative for cough, hemoptysis and sputum production.   Cardiovascular: Positive for leg swelling. Negative for chest pain, palpitations, orthopnea, claudication and PND.       Improved LE edema  Gastrointestinal: Negative for nausea and vomiting.  Musculoskeletal: Negative for falls.  Skin: Negative for rash.  Neurological: Positive for weakness. Negative for sensory change, speech change, focal weakness and loss of consciousness.  Endo/Heme/Allergies: Does not bruise/bleed easily.  Psychiatric/Behavioral: The patient is not nervous/anxious.      Objective: Telemetry: Afib, currently 90's, episodes into the 1-teens to 140's Physical Exam: Blood pressure 95/69, pulse 99, temperature 98.3 F (36.8 C), temperature source Oral, resp. rate 18, height 5' 8.5" (1.74 m), weight 183 lb 8 oz (83.235 kg), SpO2 95 %. Body mass index is 27.49 kg/(m^2). General: Well developed, well nourished, in no acute distress. Head: Normocephalic, atraumatic, sclera non-icteric, no xanthomas, nares are without discharge. Neck: Negative for carotid bruits. JVP not elevated. Lungs: Bilateral crackles along bases. Breathing is unlabored. Heart: irregularly irregular, S1 S2, III/VI systolic murmur, no  rubs, or gallops.  Abdomen: Soft, non-tender, non-distended with normoactive bowel sounds. No rebound/guarding. Extremities: No clubbing or cyanosis. Trace pre-tibial lower extremity swelling bilaterally.  2+ pitting edema bilateral LE from knees superiorly to the hips. Ulcer dressed  Neuro: Alert and oriented X 3. Moves all extremities spontaneously. Psych:  Responds to questions appropriately with a normal affect.   Intake/Output Summary (Last 24 hours) at 10/08/15 0817 Last data filed at 10/08/15 0607  Gross per 24 hour  Intake    480 ml  Output   1100 ml  Net   -620 ml    Inpatient Medications:  . acetaminophen  500 mg Oral TID  . amiodarone  400 mg Oral BID  . aspirin EC  81 mg Oral Daily  . furosemide  40 mg Oral Daily  . metoprolol succinate  25 mg Oral Daily  . multivitamin with minerals  1 tablet Oral Daily  . pantoprazole  40 mg Oral Daily  . sodium chloride flush  3 mL Intravenous Q12H  . warfarin  6 mg Oral q1800  . Warfarin - Pharmacist Dosing Inpatient   Does not apply q1800   Infusions:    Labs:  Recent Labs  10/06/15 0538 10/08/15 0432  NA 132* 133*  K 3.9 4.2  CL 100* 101  CO2 25 28  GLUCOSE 112* 124*  BUN 48* 36*  CREATININE 1.67* 1.41*  CALCIUM 9.2 8.9   No results for input(s): AST, ALT, ALKPHOS, BILITOT, PROT, ALBUMIN in the last 72 hours.  Recent Labs  10/08/15 0432  WBC 5.4  HGB 11.1*  HCT 34.6*  MCV 82.0  PLT 149*   No results for input(s): CKTOTAL, CKMB, TROPONINI in the last 72 hours. Invalid input(s): POCBNP No results  for input(s): HGBA1C in the last 72 hours.   Weights: Filed Weights   10/06/15 0451 10/07/15 0432 10/08/15 0419  Weight: 183 lb 9.6 oz (83.28 kg) 184 lb 1.6 oz (83.507 kg) 183 lb 8 oz (83.235 kg)     Radiology/Studies:  Dg Chest 2 View  09/28/2015  CLINICAL DATA:  81 year old female with increased lower extremity swelling, shortness of breath for 1 day. Initial encounter. EXAM: CHEST  2 VIEW COMPARISON:   Chest radiographs 03/24/2012. FINDINGS: Larger lung volumes with resolved small pleural effusions present in 2013. Chronic cardiomegaly appears stable. Sequelae of cardiac valve replacement and median sternotomy. Other mediastinal contours are within normal limits. Visualized tracheal air column is within normal limits. No pneumothorax. Pulmonary vascular congestion without overt edema. No confluent pulmonary opacity. Osteopenia with multilevel chronic appearing thoracic compression fractures. No acute osseous abnormality identified. IMPRESSION: Cardiomegaly and pulmonary vascular congestion without overt edema. Electronically Signed   By: Genevie Ann M.D.   On: 09/28/2015 13:13   US Venous Img Lower Bilateral  10/02/2015  CLINICAL DATA:  Lower extremity swelling. EXAM: BILATERAL LOWER EXTREMITY VENOUS DOPPLER ULTRASOUND TECHNIQUE: Gray-scale sonography with graded compression, as well as color Doppler and duplex ultrasound were performed to evaluate the lower extremity deep venous systems from the level of the common femoral vein and including the common femoral, femoral, profunda femoral, popliteal and calf veins including the posterior tibial, peroneal and gastrocnemius veins when visible. The superficial great saphenous vein was also interrogated. Spectral Doppler was utilized to evaluate flow at rest and with distal augmentation maneuvers in the common femoral, femoral and popliteal veins. COMPARISON:  02/29/2012. FINDINGS: RIGHT LOWER EXTREMITY Common Femoral Vein: No evidence of thrombus. Normal compressibility, respiratory phasicity and response to augmentation. Saphenofemoral Junction: No evidence of thrombus. Normal compressibility and flow on color Doppler imaging. Profunda Femoral Vein: No evidence of thrombus. Normal compressibility and flow on color Doppler imaging. Femoral Vein: No evidence of thrombus. Normal compressibility, respiratory phasicity and response to augmentation. Popliteal Vein: No  evidence of thrombus. Normal compressibility, respiratory phasicity and response to augmentation. Calf Veins: No evidence of thrombus. Normal compressibility and flow on color Doppler imaging. Superficial Great Saphenous Vein: No evidence of thrombus. Normal compressibility and flow on color Doppler imaging. Other Findings:  None. LEFT LOWER EXTREMITY Common Femoral Vein: No evidence of thrombus. Normal compressibility, respiratory phasicity and response to augmentation. Saphenofemoral Junction: No evidence of thrombus. Normal compressibility and flow on color Doppler imaging. Profunda Femoral Vein: No evidence of thrombus. Normal compressibility and flow on color Doppler imaging. Femoral Vein: No evidence of thrombus. Normal compressibility, respiratory phasicity and response to augmentation. Popliteal Vein: No evidence of thrombus. Normal compressibility, respiratory phasicity and response to augmentation. Calf Veins: No evidence of thrombus. Normal compressibility and flow on color Doppler imaging. Superficial Great Saphenous Vein: No evidence of thrombus. Normal compressibility and flow on color Doppler imaging. Other Findings:  None. IMPRESSION: No evidence of deep venous thrombosis. Electronically Signed   By: Marcello Moores  Register   On: 10/02/2015 12:20     Assessment and Plan   1. Persistent Afib with RVR - Rate control is limited by low blood pressure. Would benefit from attempt at restoration of sinus rhythm. - This patients CHA2DS2-VASc Score and unadjusted Ischemic Stroke Rate (% per year) is equal to 7.2 % stroke rate/year from a score of 5(CHF, HTN, Female, Age (2)). Continue Coumadin for anticoagulation. - continue Amiodarone 400mg  BID. Toprol-XL has been held multiple times secondary to hypotension. Ethelwyn Gilbertson  reduce dose from 50mg  to 25mg  daily.  - The initial plan the week prior was to perform inpatient DCCV, however upon rounding on patient on 4/4 this was changed to outpatient by rounding MD. She  continued to by tachycardic with HR in the 120-140 range - Ultimately, after many discussions with the patient and her family with several MD's the decision was made to move forward with DCCV on Monday, 10/09/2015 given her persistent poorly controlled HR and cardiomyopathy - Currently in rate controlled Afib in the 90's bpm, no changes  2. Acute on chronic combined CHF - Crackles along bilateral lung bases. Zahari Xiang give IV Lasix 40 mg x 1. - Diuresis has been limited by hypotension. Net output of -6.3 L.  - On PO Lasix 40mg  daily, has not gotten any since 4/7.  3. Acute on CKD stage III - Renal function improved on 4/8 at 1.67.   4. Bilateral LE edema - Much improved on exam today, with 2+ LE edema from knees to hips - Multifactorial with CHF and lymphedema. Though felt to be more lymphedema based on RHC at [facility]. - Continue PO Lasix and wraps/unaboots/wraps per vascular. - Vascular following.  5. Mechanical mitral valve - Mechanical valve replacement in 1994, redo MVR in 08/2014 - Continue warfarin for anticoagulation  Signed, Christell Faith, PA-C Pager: [phone] 10/08/2015, 8:17 AM  I have seen and examined this patient with Christell Faith.  Agree with above, note added to reflect my findings.  On exam, tachycardic, irregular, no murmurs, lungs clear.   On amiodarone and coumadin for anticoagulation.  Niklaus Mamaril plan for cardioversion tomorrow.  Rate controlled today in the 90s.  Has gotten a dose of IV lasix this AM.  Lungs sound improved.  Still has LE edema and would benefit from further diuresis.  Lama Narayanan M. Emiya Loomer MD 10/08/2015 10:08 AM

## 2015-10-09 ENCOUNTER — Inpatient Hospital Stay: Payer: Medicare Other | Admitting: Anesthesiology

## 2015-10-09 ENCOUNTER — Encounter
Admission: EM | Disposition: A | Discharge status: Home Health | Payer: Self-pay | Source: Home / Self Care | Attending: Internal Medicine

## 2015-10-09 ENCOUNTER — Encounter: Payer: Medicare Other | Attending: Cardiovascular Disease

## 2015-10-09 ENCOUNTER — Encounter: Payer: Self-pay | Admitting: Anesthesiology

## 2015-10-09 DIAGNOSIS — I5042 Chronic combined systolic (congestive) and diastolic (congestive) heart failure: Secondary | ICD-10-CM | POA: Insufficient documentation

## 2015-10-09 HISTORY — PX: ELECTROPHYSIOLOGIC STUDY: SHX172A

## 2015-10-09 LAB — BASIC METABOLIC PANEL
ANION GAP: 5 (ref 5–15)
BUN: 35 mg/dL — ABNORMAL HIGH (ref 6–20)
CALCIUM: 8.6 mg/dL — AB (ref 8.9–10.3)
CO2: 27 mmol/L (ref 22–32)
Chloride: 103 mmol/L (ref 101–111)
Creatinine, Ser: 1.46 mg/dL — ABNORMAL HIGH (ref 0.44–1.00)
GFR, EST AFRICAN AMERICAN: 39 mL/min — AB (ref >60)
GFR, EST NON AFRICAN AMERICAN: 33 mL/min — AB (ref >60)
GLUCOSE: 104 mg/dL — AB (ref 65–99)
Potassium: 3.7 mmol/L (ref 3.5–5.1)
Sodium: 135 mmol/L (ref 135–145)

## 2015-10-09 LAB — PROTIME-INR
INR: 3.89
PROTHROMBIN TIME: 37.2 s — AB (ref 11.4–15.0)

## 2015-10-09 SURGERY — CARDIOVERSION (CATH LAB)
Anesthesia: General

## 2015-10-09 MED ORDER — SODIUM CHLORIDE 0.9% FLUSH
3.0000 mL | Freq: Two times a day (BID) | INTRAVENOUS | Status: DC
  Administered 2015-10-09 (×2): 3 mL via INTRAVENOUS

## 2015-10-09 MED ORDER — PROPOFOL 10 MG/ML IV BOLUS
INTRAVENOUS | Status: DC | PRN
  Administered 2015-10-09: 60 mg via INTRAVENOUS

## 2015-10-09 MED ORDER — SODIUM CHLORIDE 0.9% FLUSH: 3.0000 mL | INTRAVENOUS | Status: DC | PRN

## 2015-10-09 MED ORDER — SODIUM CHLORIDE 0.9 % IV SOLN
250.0000 mL | INTRAVENOUS | Status: DC
  Administered 2015-10-09: 08:00:00 via INTRAVENOUS

## 2015-10-09 MED ORDER — AMIODARONE HCL 200 MG PO TABS
200.0000 mg | ORAL_TABLET | Freq: Two times a day (BID) | ORAL | Status: DC
  Administered 2015-10-09 – 2015-10-10 (×3): 200 mg via ORAL
  Filled 2015-10-09 (×3): qty 1

## 2015-10-09 MED ORDER — ACETAMINOPHEN 325 MG PO TABS: 650.0000 mg | ORAL_TABLET | ORAL | Status: AC

## 2015-10-09 NOTE — Anesthesia Procedure Notes (Signed)
Date/Time: 10/09/2015 7:41 AM Performed by: Doreen Salvage Pre-anesthesia Checklist: Patient identified, Emergency Drugs available, Suction available and Patient being monitored Patient Re-evaluated:Patient Re-evaluated prior to inductionOxygen Delivery Method: Nasal cannula Intubation Type: IV induction Dental Injury: Teeth and Oropharynx as per pre-operative assessment  Comments: Nasal cannula with etCO2 monitoring

## 2015-10-09 NOTE — Transfer of Care (Signed)
Immediate Anesthesia Transfer of Care Note  Patient: [name]  Procedure(s) Performed: Procedure(s): CARDIOVERSION (N/A)  Patient Location: PACU  Anesthesia Type:General  Level of Consciousness: sedated  Airway & Oxygen Therapy: Patient Spontanous Breathing and Patient connected to face mask oxygen  Post-op Assessment: Report given to RN and Post -op Vital signs reviewed and stable  Post vital signs: Reviewed and stable  Last Vitals:  Filed Vitals:   10/09/15 0750 10/09/15 0751  BP: 94/57   Pulse: 50 51  Temp:    Resp: 28 25    Complications: No apparent anesthesia complications

## 2015-10-09 NOTE — Consult Note (Addendum)
WOC wound consult note Pt is familiar to [facility] service.  Refer to previous progress notes on 3/31.   Pt was previously using calcium alginate dressings and Una boots which were changed weekly.  She states the [employer] are too painful and she is unable to tolerate them any more. Vascular team has requested assistance with a different topical treatment for left leg.   Wound type: Pt has several chronic full thickness stasis ulcers to left calf Measurement: 4.5X4.5X.2cm, 50% red, 50% yellow, mod amt yellow drainage, no odor 1.5X1X.2cm, 80% yellow, 20% red,  mod amt yellow drainage, no odor 1.5X.3X.2cm, 80% yellow, 20% red,  mod amt yellow drainage, no odor Periwound: Intact skin surrounding with venous stasis skin changes, small amt edema to LLE Dressing procedure/placement/frequency: Orders provided for bedside nurses to perform topical treatment. Continue present plan of care with calcium alginate to absorb drainage and promote healing.  Applied ace wrap for light compression.  Discussed plan of care with patient and she verbalized understanding. Please re-consult if further assistance is needed.  Thank-you,  [name] MSN, [place], [place], [place], [place]

## 2015-10-09 NOTE — OR Nursing (Signed)
Pt monitored during case by anesthesia,  Transfer of care back to SPR at approx 750. One shock at 745 at 200 J converted pt back to SB. Marland Kitchen

## 2015-10-09 NOTE — CV Procedure (Signed)
Cardioversion note: A standard informed consent was obtained. Timeout was performed. The pads were placed in the anterior posterior fashion. The patient was given propofol by the anesthesia team.  Successful cardioversion was performed with a 200 J. The patient converted to sinus rhythm. Pre-and post EKGs were reviewed. The patient tolerated the procedure with no immediate complications.  Recommendations: Decrease Amiodarone to 200 mg twice daily, Continue Toprol 25 mg once daily and Warfarin.  Can discharge home from a cardiac standpoint if she remains in NSR today.

## 2015-10-09 NOTE — Anesthesia Preprocedure Evaluation (Signed)
Anesthesia Evaluation  Patient identified by MRN, date of birth, ID band Patient awake    Reviewed: Allergy & Precautions, H&P , NPO status , Patient's Chart, lab work & pertinent test results  History of Anesthesia Complications Negative for: history of anesthetic complications  Airway Mallampati: III  TM Distance: >3 FB Neck ROM: limited    Dental  (+) Poor Dentition   Pulmonary shortness of breath, former smoker,    Pulmonary exam normal breath sounds clear to auscultation       Cardiovascular Exercise Tolerance: Poor hypertension, (-) angina+CHF and + DOE  (-) Past MI + dysrhythmias Atrial Fibrillation III+ Valvular Problems/Murmurs MR  Rhythm:irregular Rate:Normal     Neuro/Psych negative neurological ROS  negative psych ROS   GI/Hepatic negative GI ROS, Neg liver ROS, GERD  Controlled,  Endo/Other  negative endocrine ROS  Renal/GU Renal disease  negative genitourinary   Musculoskeletal  (+) Arthritis ,   Abdominal   Peds  Hematology negative hematology ROS (+)   Anesthesia Other Findings Past Medical History:   Shortness of breath dyspnea                                  Hypertension                                                 Chronic combined systolic (congestive) and dia*                Comment:a. EF 25% by echo in 08/2015 b. RHC in 08/2015               showed normal filling pressures   GERD (gastroesophageal reflux disease)                       Arthritis                                                    Chronic atrial fibrillation (HCC)                              Comment:a. On Coumadin   S/P MVR (mitral valve replacement)                             Comment:a. MVR 1994 b. redo MVR in 08/2014 - on               Coumadin  Past Surgical History:   CARDIAC CATHETERIZATION                                       pyloric stenosis                                              CATARACT EXTRACTION  W/PHACO  Right 02/20/2015      Comment:Procedure: CATARACT EXTRACTION PHACO AND               INTRAOCULAR LENS PLACEMENT (IOC);  Surgeon:               Estill Cotta, MD;  Location: [facility] ORS;                Service: Ophthalmology;  Laterality: Right;                US01:31.2 AP 25.3% CDE40.13 Fluid lot #               [id number]   BREAST SURGERY                                  Right 2005?          Comment:benign lump excision   CARDIAC VALVE REPLACEMENT                        1994, 2016   IRRIGATION AND DEBRIDEMENT HEMATOMA             Left 07/05/2015       Comment:Procedure: IRRIGATION AND DEBRIDEMENT HEMATOMA;              Surgeon: Robert Bellow, MD;  Location: [facility]              ORS;  Service: General;  Laterality: Left;   US ECHOCARDIOGRAPHY                                          BMI    Body Mass Index   27.46 kg/m 2      Reproductive/Obstetrics negative OB ROS                             Anesthesia Physical Anesthesia Plan  ASA: IV  Anesthesia Plan: General   Post-op Pain Management:    Induction:   Airway Management Planned:   Additional Equipment:   Intra-op Plan:   Post-operative Plan:   Informed Consent: I have reviewed the patients History and Physical, chart, labs and discussed the procedure including the risks, benefits and alternatives for the proposed anesthesia with the patient or authorized representative who has indicated his/her understanding and acceptance.   Dental Advisory Given  Plan Discussed with: Anesthesiologist, CRNA and Surgeon  Anesthesia Plan Comments:         Anesthesia Quick Evaluation

## 2015-10-09 NOTE — Progress Notes (Signed)
[facility]  ROUNDING NOTE   Subjective:  Renal function about the same today. Creatinine currently 1.46. Breathing comfortably at the moment.   Objective:  Vital signs in last 24 hours:  Temp:  [97.4 F (36.3 C)-98.5 F (36.9 C)] 97.4 F (36.3 C) (04/10 1113) Pulse Rate:  [30-128] 64 (04/10 1113) Resp:  [15-28] 18 (04/10 1113) BP: (94-113)/(37-87) 113/47 mmHg (04/10 1113) SpO2:  [87 %-98 %] 92 % (04/10 1113) Weight:  [83.144 kg (183 lb 4.8 oz)] 83.144 kg (183 lb 4.8 oz) (04/10 0437)  Weight change: -0.091 kg (-3.2 oz) Filed Weights   10/07/15 0432 10/08/15 0419 10/09/15 0437  Weight: 83.507 kg (184 lb 1.6 oz) 83.235 kg (183 lb 8 oz) 83.144 kg (183 lb 4.8 oz)    Intake/Output: I/O last 3 completed shifts: In: 240 [P.O.:240] Out: 1725 [Urine:1725]   Intake/Output this shift:  Total I/O In: 340 [P.O.:240; I.V.:100] Out: -   Physical Exam: General: NAD, laying in bed  Head: Normocephalic, atraumatic. Moist oral mucosal membranes  Eyes: Anicteric  Neck: Supple, trachea midline  Lungs:  Clear bilaterally  Heart: Irregular, A Fib  Abdomen:  Soft, nontender, BS present  Extremities: 1+ b/l LE edema  Neurologic: Nonfocal, moving all four extremities  Skin: No acute lesions       Basic Metabolic Panel:  Recent Labs Lab 10/04/15 0502 10/05/15 0450 10/06/15 0538 10/08/15 0432 10/09/15 0501  NA 135 133* 132* 133* 135  K 3.7 3.8 3.9 4.2 3.7  CL 102 100* 100* 101 103  CO2 27 28 25 28 27   GLUCOSE 115* 114* 112* 124* 104*  BUN 47* 40* 48* 36* 35*  CREATININE 1.57* 1.66* 1.67* 1.41* 1.46*  CALCIUM 9.2 8.8* 9.2 8.9 8.6*    Liver Function Tests: No results for input(s): AST, ALT, ALKPHOS, BILITOT, PROT, ALBUMIN in the last 168 hours. No results for input(s): LIPASE, AMYLASE in the last 168 hours. No results for input(s): AMMONIA in the last 168 hours.  CBC:  Recent Labs Lab 10/05/15 0450 10/08/15 0432  WBC 5.0 5.4  HGB 10.6* 11.1*  HCT  32.8* 34.6*  MCV 81.4 82.0  PLT 115* 149*    Cardiac Enzymes: No results for input(s): CKTOTAL, CKMB, CKMBINDEX, TROPONINI in the last 168 hours.  BNP: Invalid input(s): POCBNP  CBG: No results for input(s): GLUCAP in the last 168 hours.  Microbiology: Results for orders placed or performed in visit on 03/22/12  Urine culture     Status: None   Collection Time: 03/22/12 11:51 AM  Result Value Ref Range Status   Micro Text Report   Final       SOURCE: CC    COMMENT                   MIXED BACTERIAL ORGANISMS   COMMENT                   RESULTS SUGGESTIVE OF CONTAMINATION   ANTIBIOTIC                                                      Culture, blood (single)     Status: None   Collection Time: 03/22/12 12:03 PM  Result Value Ref Range Status   Micro Text Report   Final       COMMENT  NO GROWTH AEROBICALLY/ANAEROBICALLY IN 5 DAYS   ANTIBIOTIC                                                      Culture, blood (single)     Status: None   Collection Time: 03/22/12 12:08 PM  Result Value Ref Range Status   Micro Text Report   Final       COMMENT                   NO GROWTH AEROBICALLY/ANAEROBICALLY IN 5 DAYS   ANTIBIOTIC                                                      Culture, blood (single)     Status: None   Collection Time: 03/24/12 11:09 AM  Result Value Ref Range Status   Micro Text Report   Final       COMMENT                   NO GROWTH AEROBICALLY/ANAEROBICALLY IN 5 DAYS   ANTIBIOTIC                                                        Coagulation Studies:  Recent Labs  10/07/15 0649 10/08/15 0432 10/09/15 0501  LABPROT 28.9* 31.2* 37.2*  INR 2.78 3.08 3.89    Urinalysis: No results for input(s): COLORURINE, LABSPEC, PHURINE, GLUCOSEU, HGBUR, BILIRUBINUR, KETONESUR, PROTEINUR, UROBILINOGEN, NITRITE, LEUKOCYTESUR in the last 72 hours.  Invalid input(s): APPERANCEUR    Imaging: No results found.   Medications:    . sodium chloride Stopped (10/09/15 0750)   . acetaminophen  500 mg Oral TID  . acetaminophen  650 mg Oral STAT  . amiodarone  200 mg Oral BID  . aspirin EC  81 mg Oral Daily  . furosemide  40 mg Oral Daily  . metoprolol succinate  25 mg Oral Daily  . multivitamin with minerals  1 tablet Oral Daily  . pantoprazole  40 mg Oral Daily  . sodium chloride flush  3 mL Intravenous Q12H  . sodium chloride flush  3 mL Intravenous Q12H  . Warfarin - Pharmacist Dosing Inpatient   Does not apply q1800   HYDROcodone-acetaminophen, morphine injection, sodium chloride flush, sodium chloride flush, vitamin C  Assessment/ Plan:  [name] is a 81 y.o. white female with hypertension, systolic and diastolic congestive heart failure, mitral valve replacement 09/05/15, hyperlipidemia, atrial fibrillation, anemia, who was admitted to [facility] on 09/28/2015 for Acute on chronic congestive heart failure, unspecified congestive heart failure type ([place]) [I50.9]   1. Acute Renal Failure with hyperkalemia on chronic kidney disease stage III: baseline creatinine of 1.09 eGFR of 48 04/30/15   Likely ARF from acute cardiorenal syndrome - Outpatient, patient was taking Torsemide 40 BID - renal function relatively stable.  Creatinine currently 1.4.  Continue current dosage of diuretics.  2. LE edema with acute exacerbation of systolic and diastolic congestive heart failure  with new mitral valve replacement - blood pressure low normal - LV EF: 40% - 45%; Systolic pressure was mildly elevated. PA  peak pressure: 43 mm Hg  - overall leg edema has improved with conservative therapy.  Continue Lasix 40 mg by mouth daily.  3. Hyponatremia: due to hypervolemia.  Sodium now normalized at 135.  Continue Lasix.  4. A Fib - continue amiodarone.    LOS: [place], Chris Cripps 4/10/20174:04 PM

## 2015-10-09 NOTE — Progress Notes (Signed)
ANTICOAGULATION CONSULT NOTE - FOLLOW UP   Pharmacy Consult for Warfarin Indication: Chronic Afib with Mitral Valve Replacement  Allergies  Allergen Reactions  . Ace Inhibitors     Other reaction(s): Cough  . Hydrocodone Itching and Nausea Only  . Mercury     Other reaction(s): Unknown  . Nickel   . Silver Dermatitis    Severe itching  . Cephalexin Rash  . Penicillins Rash    Patient Measurements: Height: 5' 8.5" (174 cm) Weight: 183 lb 4.8 oz (83.144 kg) IBW/kg (Calculated) : 65.05 Heparin Dosing Weight:   Vital Signs: Temp: 98 F (36.7 C) (04/10 0742) Temp Source: Oral (04/10 0437) BP: 104/61 mmHg (04/10 0845) Pulse Rate: 78 (04/10 0928)  Labs:  Recent Labs  10/07/15 0649 10/08/15 0432 10/09/15 0501  HGB  --  11.1*  --   HCT  --  34.6*  --   PLT  --  149*  --   LABPROT 28.9* 31.2* 37.2*  INR 2.78 3.08 3.89  CREATININE  --  1.41* 1.46*    Estimated Creatinine Clearance: 36.2 mL/min (by C-G formula based on Cr of 1.46).   Medical History: Past Medical History  Diagnosis Date  . Shortness of breath dyspnea   . Hypertension   . Chronic combined systolic (congestive) and diastolic (congestive) heart failure (HCC)     a. EF 25% by echo in 08/2015 b. RHC in 08/2015 showed normal filling pressures  . GERD (gastroesophageal reflux disease)   . Arthritis   . Chronic atrial fibrillation ([place])     a. On Coumadin  . S/P MVR (mitral valve replacement)     a. MVR 1994 b. redo MVR in 08/2014 - on Coumadin    Medications:  Scheduled:  . acetaminophen  500 mg Oral TID  . acetaminophen  650 mg Oral STAT  . amiodarone  200 mg Oral BID  . aspirin EC  81 mg Oral Daily  . furosemide  40 mg Oral Daily  . metoprolol succinate  25 mg Oral Daily  . multivitamin with minerals  1 tablet Oral Daily  . pantoprazole  40 mg Oral Daily  . sodium chloride flush  3 mL Intravenous Q12H  . sodium chloride flush  3 mL Intravenous Q12H  . Warfarin - Pharmacist Dosing Inpatient    Does not apply q1800    Assessment: 81 yo admitted with SOB, pulmonary edema, CHF.  Hx chronic atrial fibrillation on Coumadin, mitral valve replacement. Warfarin dose at home per PTA meds:  5 mg Mon, Tues, Thur, Sat,Sun  And 7.5 mg  Wed, Fri.  4/5   INR 2.50  Warfarin 7.5 mg 4/6   INR 2.91  Warfarin 5 mg 4/7   INR 3.05  Warfarin 5 mg 4/8   INR 2.78  Warfarin 6 mg 4/9   INR 3.08  Warfarin 6 mg 4/10 INR 3.89    Goal of Therapy:  INR 2.5-3.5 Monitor platelets by anticoagulation protocol: Yes   Plan:  INR is now supratherapeutic. Paient is on amiodarone which may increase INR. Will hold Coumadin today and f/u AM INR.   Ulice Dash, PharmD Clinical Pharmacist  10/09/2015 11:09 AM
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
## 2015-10-09 NOTE — Evaluation (Signed)
Physical Therapy Re-Evaluation Patient Details Name: [name] MRN: [id number] DOB: [birth year] Today's Date: 10/09/2015   History of Present Illness  Patient is a 81 y.o. female that presents with recent mitral valve replacement, admitted for worsening LE edema, weight gain, and acute on chronic CHF.  Pt discharged from PT in house and new PT consult received 10/09/15 for re-eval.  Pt demonstrating afib with RVR and s/p DC cardioversion 10/09/15 (successful NSR)  Clinical Impression  Pt seen for PT re-eval.  Prior to admission, pt was independent without AD.  Pt initially discharged from PT in house earlier in hospitalization but pt now demonstrates deconditioning from extended hospitalization and time in bed.  Pt mildly unsteady with gait at times but no actually loss of balance requiring assist to steady occurred (pt CGA to SBA with ambulation).  [name] O2 89-91% on 2 L/min O2 via nasal cannula during ambulation and HR 60-89 bpm during session.  Pt would benefit from skilled PT to address noted impairments and functional limitations.  Recommend pt discharge to home when medically appropriate.  Anticipate with continued ambulation/mobility during hospitalization pt will continue to progress with functional mobility and not require further PT upon discharge.     Follow Up Recommendations No PT follow up    Equipment Recommendations       Recommendations for Other Services       Precautions / Restrictions Precautions Precautions: Fall Precaution Comments: Keep B LE's elevated; L calf wound Restrictions Weight Bearing Restrictions: No      Mobility  Bed Mobility Overal bed mobility: Modified Independent             General bed mobility comments: Supine to/from sit with HOB elevated  Transfers Overall transfer level: Needs assistance Equipment used: None Transfers: Sit to/from Stand Sit to Stand: Supervision         General transfer comment: steady without loss of  balance  Ambulation/Gait Ambulation/Gait assistance: Supervision;Min guard Ambulation Distance (Feet): 220 Feet Assistive device: None   Gait velocity: decreased   General Gait Details: decreased B step length/foot clearance; mildly increased BOS; mildly unsteady at times but no loss of balance requiring assist to steady  Stairs            Wheelchair Mobility    Modified Rankin (Stroke Patients Only)       Balance Overall balance assessment: Needs assistance Sitting-balance support: No upper extremity supported;Feet supported Sitting balance-Leahy Scale: Normal     Standing balance support: No upper extremity supported;During functional activity Standing balance-Leahy Scale: Good Standing balance comment: during ambulation                             Pertinent Vitals/Pain Pain Assessment: 0-10 Pain Score: 4  Pain Location: L calf wound Pain Descriptors / Indicators: Sore Pain Intervention(s): Limited activity within patient's tolerance;Monitored during session;Repositioned  Vitals stable and WFL throughout treatment session.    Home Living Family/patient expects to be discharged to:: Private residence Living Arrangements: Children ([name] daughter who is only home at night) Available Help at Discharge: Family Type of Home: House Home Access: Stairs to enter Entrance Stairs-Rails: None Entrance Stairs-Number of Steps: 2 Home Layout: Two level Home Equipment: Walker - 2 wheels      Prior Function Level of Independence: Independent         Comments: Patient reports being independent with mobility at baseline and not using AD, denies falls.  Hand Dominance        Extremity/Trunk Assessment   Upper Extremity Assessment: Overall WFL for tasks assessed           Lower Extremity Assessment: Generalized weakness      Cervical / Trunk Assessment: Normal  Communication   Communication: No difficulties  Cognition  Arousal/Alertness: Awake/alert Behavior During Therapy: WFL for tasks assessed/performed Overall Cognitive Status: Within Functional Limits for tasks assessed                      General Comments General comments (skin integrity, edema, etc.): B LE edema  Nursing cleared pt for participation in physical therapy.  Pt agreeable to PT session.    Exercises  Ambulation      Assessment/Plan    PT Assessment Patient needs continued PT services  PT Diagnosis Difficulty walking;Generalized weakness   PT Problem List Decreased strength;Decreased activity tolerance;Decreased balance;Decreased mobility  PT Treatment Interventions DME instruction;Gait training;Stair training;Functional mobility training;Therapeutic activities;Therapeutic exercise;Balance training;Patient/family education   PT Goals (Current goals can be found in the Care Plan section) Acute Rehab PT Goals Patient Stated Goal: To return home once her swelling goes down.  PT Goal Formulation: With patient Time For Goal Achievement: 10/23/15 Potential to Achieve Goals: Good    Frequency Min 2X/week   Barriers to discharge        Co-evaluation               End of Session Equipment Utilized During Treatment: Gait belt Activity Tolerance: Patient tolerated treatment well Patient left: in bed;with call bell/phone within reach;with family/visitor present (B LE's elevated in bed) Nurse Communication: Mobility status;Precautions (HR and O2 sats with functional mobility)         Time: [id number] PT Time Calculation (min) (ACUTE ONLY): 26 min   Charges:   PT Evaluation $PT Re-evaluation: 1 Procedure PT Treatments $Therapeutic Exercise: 8-22 mins   PT G CodesLeitha Bleak 10-24-2015, 4:15 PM Leitha Bleak, [place]

## 2015-10-09 NOTE — Anesthesia Postprocedure Evaluation (Signed)
Anesthesia Post Note  Patient: [name]  Procedure(s) Performed: Procedure(s) (LRB): CARDIOVERSION (N/A)  Patient location during evaluation: Cath Lab Anesthesia Type: General Level of consciousness: awake and alert Pain management: pain level controlled Vital Signs Assessment: post-procedure vital signs reviewed and stable Respiratory status: spontaneous breathing, nonlabored ventilation, respiratory function stable and patient connected to nasal cannula oxygen Cardiovascular status: blood pressure returned to baseline and stable Postop Assessment: no signs of nausea or vomiting Anesthetic complications: no    Last Vitals:  Filed Vitals:   10/09/15 0751 10/09/15 0845  BP:  104/61  Pulse: 51 59  Temp:    Resp: 25 20    Last Pain:  Filed Vitals:   10/09/15 0846  PainSc: Asleep                 Precious Haws Libi Corso

## 2015-10-10 ENCOUNTER — Inpatient Hospital Stay: Payer: Medicare Other

## 2015-10-10 LAB — PROTIME-INR
INR: 4.06
Prothrombin Time: 38.6 seconds — ABNORMAL HIGH (ref 11.4–15.0)

## 2015-10-10 MED ORDER — AMIODARONE HCL 200 MG PO TABS: 200.0000 mg | ORAL_TABLET | Freq: Two times a day (BID) | ORAL | Status: DC

## 2015-10-10 MED ORDER — METOPROLOL SUCCINATE ER 25 MG PO TB24: 25.0000 mg | ORAL_TABLET | Freq: Every day | ORAL | Status: DC

## 2015-10-10 MED ORDER — FUROSEMIDE 40 MG PO TABS: 40.0000 mg | ORAL_TABLET | Freq: Every day | ORAL | Status: DC

## 2015-10-10 MED ORDER — HYDROCODONE-ACETAMINOPHEN 5-325 MG PO TABS: 1.0000 | ORAL_TABLET | Freq: Four times a day (QID) | ORAL | Status: DC | PRN

## 2015-10-10 MED ORDER — WARFARIN SODIUM 5 MG PO TABS: 5.0000 mg | ORAL_TABLET | Freq: Every day | ORAL | Status: DC

## 2015-10-10 NOTE — Care Management (Signed)
Patient requires repositioning of the body that cannot be achieved with a normal bed or wedge pillow. Patient requires the head of the bed to be elevated more than 30 degrees due to CHF. Patient requires frequent changes in body position.

## 2015-10-10 NOTE — Care Management (Signed)
Met with daughter and patient at bedside. Theya re agreeable to [facility] nursing. Prefers [facility]. Referral called to [facility]

## 2015-10-10 NOTE — Care Management (Signed)
Ordered Hospital bed from [facility]

## 2015-10-10 NOTE — Care Management Important Message (Signed)
Important Message  Patient Details  Name: [name] MRN: [id number] Date of Birth: 04-09-1935   Medicare Important Message Given:  Yes    Juliann Pulse A Raelynn Corron 10/10/2015, 10:21 AM
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
## 2015-10-10 NOTE — Progress Notes (Signed)
All d/c forms completed and explained to pt and pt's daughter. Central tele notified of tele being d/c. Explained pt not to take coumadin today and start tomorrow per MD's notes.   Prescription given to pt. Pt verbalizes understanding of al instructions.

## 2015-10-10 NOTE — Progress Notes (Signed)
Rn notified about critical INR of 4.02. Pharmacist Nate notified. Will continue to monitor.   [name] M

## 2015-10-10 NOTE — Progress Notes (Signed)
ANTICOAGULATION CONSULT NOTE - FOLLOW UP   Pharmacy Consult for Warfarin Indication: Chronic Afib with Mitral Valve Replacement  Allergies  Allergen Reactions  . Ace Inhibitors     Other reaction(s): Cough  . Hydrocodone Itching and Nausea Only  . Mercury     Other reaction(s): Unknown  . Nickel   . Silver Dermatitis    Severe itching  . Cephalexin Rash  . Penicillins Rash    Patient Measurements: Height: 5' 8.5" (174 cm) Weight: 182 lb 10.8 oz (82.86 kg) IBW/kg (Calculated) : 65.05 Heparin Dosing Weight:   Vital Signs: Temp: 97.4 F (36.3 C) (04/11 0631) Temp Source: Oral (04/11 0631) BP: 107/64 mmHg (04/11 0631) Pulse Rate: 55 (04/11 0631)  Labs:  Recent Labs  10/08/15 0432 10/09/15 0501 10/10/15 0443  HGB 11.1*  --   --   HCT 34.6*  --   --   PLT 149*  --   --   LABPROT 31.2* 37.2* 38.6*  INR 3.08 3.89 4.06*  CREATININE 1.41* 1.46*  --     Estimated Creatinine Clearance: 36.2 mL/min (by C-G formula based on Cr of 1.46).   Medical History: Past Medical History  Diagnosis Date  . Shortness of breath dyspnea   . Hypertension   . Chronic combined systolic (congestive) and diastolic (congestive) heart failure (HCC)     a. EF 25% by echo in 08/2015 b. RHC in 08/2015 showed normal filling pressures  . GERD (gastroesophageal reflux disease)   . Arthritis   . Chronic atrial fibrillation ([place])     a. On Coumadin  . S/P MVR (mitral valve replacement)     a. MVR 1994 b. redo MVR in 08/2014 - on Coumadin    Medications:  Scheduled:  . acetaminophen  500 mg Oral TID  . acetaminophen  650 mg Oral STAT  . amiodarone  200 mg Oral BID  . aspirin EC  81 mg Oral Daily  . furosemide  40 mg Oral Daily  . metoprolol succinate  25 mg Oral Daily  . multivitamin with minerals  1 tablet Oral Daily  . pantoprazole  40 mg Oral Daily  . sodium chloride flush  3 mL Intravenous Q12H  . sodium chloride flush  3 mL Intravenous Q12H  . Warfarin - Pharmacist Dosing  Inpatient   Does not apply q1800    Assessment: 81 yo admitted with SOB, pulmonary edema, CHF.  Hx chronic atrial fibrillation on Coumadin, mitral valve replacement. Warfarin dose at home per PTA meds:  5 mg Mon, Tues, Thur, Sat,Sun  And 7.5 mg  Wed, Fri.  4/5   INR 2.50  Warfarin 7.5 mg 4/6   INR 2.91  Warfarin 5 mg 4/7   INR 3.05  Warfarin 5 mg 4/8   INR 2.78  Warfarin 6 mg 4/9   INR 3.08  Warfarin 6 mg 4/10 INR 3.89  No warfarin 4/11 INR 4.06   Goal of Therapy:  INR 2.5-3.5 Monitor platelets by anticoagulation protocol: Yes   Plan:  INR is still supratherapeutic. Paient is on amiodarone which may increase INR. Will continue to hold Coumadin today and f/u AM INR.    Ramond Dial, Pharm.D Clinical Pharmacist  10/10/2015 7:36 AM

## 2015-10-10 NOTE — Care Management (Addendum)
Left message for daughter regarding return call. Discharge today with home health services

## 2015-10-11 ENCOUNTER — Telehealth: Payer: Self-pay | Admitting: Cardiovascular Disease

## 2015-10-11 NOTE — Telephone Encounter (Signed)
Spoke w/ [name].  Advised her of Ryan's recommendation. She verbalizes understanding and will keep appt w/ Ignacia Bayley, NP on 10/26/15.

## 2015-10-11 NOTE — Telephone Encounter (Signed)
Pt daughter calling stating that we in hospital we started her in amiordone    *STAT* If patient is at the pharmacy, call can be transferred to refill team.   1. Which medications need to be refilled? (please list name of each medication and dose if known) Amiodarone    2. Which pharmacy/location (including street and city if local pharmacy) is medication to be sent to? [facility] [address]   3. Do they need a 30 day or 90 day supply? [place]

## 2015-10-11 NOTE — Telephone Encounter (Signed)
Patient underwent successful DCCV while inpatient on 10/09/2015. She does not need outpatient DCCV. She was discharged on amiodarone 200 mg bid. Notes copied and pasted in this phone note are not up to date. Please see patient's hospitalization for details. Yes, patient needs to remain on amiodarone 200 mg bid x 1 week, then 200 mg daily thereafter. She also needs hospital follow up with our office.

## 2015-10-11 NOTE — Telephone Encounter (Signed)
Expand All Collapse All   Patient's daughter is calling about a refill on amiodarone, there is no follow up appointment scheduled for [facility]. Please see notes below and advise if to refill amiodarone or not.     Talked with the patient initially at the bedside. She prefers to wait to have an outpatient DCCV instead of doing this tomorrow. She asked for me to talk with her daughter about this more.  The patient's daughter prefers for her mom to have the DCCV tomorrow. However, she says she will ultimately leave the decision up to her mother.   I informed her the patient's HR is controlled in the 90's - low-100's and she could be discharged from a Cardiology perspective and come back for the outpatient DCCV if that is what she prefers.   She is unsure of what option they prefer. Right now, she wants to respect her mom's decision and wait for an outpatient DCCV in 3 weeks. I informed her she will not be added to the DCCV schedule for tomorrow then.   She was in agreement with this and voiced appreciation of the call.  Signed, Erma Heritage, PA-C 10/05/2015, 3:03 PM Pager: [phone]             Blain Pais at 10/05/2015 11:07 AM     Status: Signed       Expand All Collapse All   Pt daughter called, states pt is inpatient in [place] [facility], and would like to talk w Dr. Rockey Situ. States Dr. Ellyn Hack stopped by yesterday, and she has some questions. Please call. States she is getting conflicting information.

## 2015-10-17 ENCOUNTER — Ambulatory Visit: Payer: Medicare Other | Admitting: Family

## 2015-10-18 ENCOUNTER — Encounter: Payer: Medicare Other | Admitting: *Deleted

## 2015-10-18 ENCOUNTER — Encounter: Payer: Self-pay | Admitting: *Deleted

## 2015-10-18 ENCOUNTER — Ambulatory Visit: Payer: Medicare Other | Admitting: General Surgery

## 2015-10-18 DIAGNOSIS — Z952 Presence of prosthetic heart valve: Secondary | ICD-10-CM

## 2015-10-18 NOTE — Progress Notes (Signed)
Daily Session Note  Patient Details  Name: [name] MRN: [id number] Date of Birth: [birth year] Referring Provider:    Encounter Date: 10/18/2015  Check In:      Goals Met:  Proper associated with RPD/PD & O2 Sat Exercise tolerated well  Goals Unmet:  Not Applicable  Comments:    Dr. [name] is Medical Director for [facility] and [facility].

## 2015-10-18 NOTE — Progress Notes (Signed)
This encounter was created in error - please disregard.

## 2015-10-18 NOTE — Addendum Note (Signed)
Addended by: Gerlene Burdock on: 10/18/2015 07:21 PM   Modules accepted: Level of Service, SmartSet

## 2015-10-22 ENCOUNTER — Encounter: Payer: Self-pay | Admitting: *Deleted

## 2015-10-22 DIAGNOSIS — I5042 Chronic combined systolic (congestive) and diastolic (congestive) heart failure: Secondary | ICD-10-CM

## 2015-10-22 DIAGNOSIS — Z952 Presence of prosthetic heart valve: Secondary | ICD-10-CM

## 2015-10-22 NOTE — Progress Notes (Signed)
Cardiac Individual Treatment Plan  Patient Details  Name: [name] MRN: [id number] Date of Birth: 1934/11/02 Referring Provider:    Initial Encounter Date:       Cardiac Rehab from 09/25/2015 in [facility] Cardiac and Pulmonary Rehab   Date  09/25/15      Visit Diagnosis: S/P MVR (mitral valve replacement)  Chronic combined systolic and diastolic congestive heart failure ([place])  Patient's Home Medications on Admission:  Current outpatient prescriptions:  .  acetaminophen (TYLENOL) 500 MG tablet, Take 500 mg by mouth every 6 (six) hours as needed for mild pain, moderate pain, fever or headache., Disp: , Rfl:  .  amiodarone (PACERONE) 200 MG tablet, Take 1 tablet (200 mg total) by mouth 2 (two) times daily., Disp: 60 tablet, Rfl: 2 .  aspirin EC 81 MG tablet, Take 81 mg by mouth daily. , Disp: , Rfl:  .  furosemide (LASIX) 40 MG tablet, Take 1 tablet (40 mg total) by mouth daily., Disp: 30 tablet, Rfl: 2 .  HYDROcodone-acetaminophen (NORCO/VICODIN) 5-325 MG tablet, Take 1 tablet by mouth every 6 (six) hours as needed for moderate pain or severe pain., Disp: 25 tablet, Rfl: 0 .  metoprolol succinate (TOPROL-XL) 25 MG 24 hr tablet, Take 1 tablet (25 mg total) by mouth daily., Disp: 30 tablet, Rfl: 2 .  Multiple Vitamin (MULTI-VITAMINS) TABS, Take 1 tablet by mouth daily. , Disp: , Rfl:  .  pantoprazole (PROTONIX) 40 MG tablet, Take 40 mg by mouth daily. , Disp: , Rfl:  .  vitamin C (ASCORBIC ACID) 500 MG tablet, Take 500 mg by mouth daily as needed (for cold symptoms). , Disp: , Rfl:  .  warfarin (COUMADIN) 5 MG tablet, Take 1 tablet (5 mg total) by mouth daily., Disp: 30 tablet, Rfl: 2  Past Medical History: Past Medical History  Diagnosis Date  . Shortness of breath dyspnea   . Hypertension   . Chronic combined systolic (congestive) and diastolic (congestive) heart failure (HCC)     a. EF 25% by echo in 08/2015 b. RHC in 08/2015 showed normal filling pressures  . GERD  (gastroesophageal reflux disease)   . Arthritis   . Chronic atrial fibrillation ([place])     a. On Coumadin  . S/P MVR (mitral valve replacement)     a. MVR 1994 b. redo MVR in 08/2014 - on Coumadin    Tobacco Use: History  Smoking status  . Former Smoker  Smokeless tobacco  . Never Used    Labs: Recent Review Flowsheet Data    There is no flowsheet data to display.       Exercise Target Goals:    Exercise Program Goal: Individual exercise prescription set with THRR, safety & activity barriers. Participant demonstrates ability to understand and report RPE using BORG scale, to self-measure pulse accurately, and to acknowledge the importance of the exercise prescription.  Exercise Prescription Goal: Starting with aerobic activity 30 plus minutes a day, 3 days per week for initial exercise prescription. Provide home exercise prescription and guidelines that participant acknowledges understanding prior to discharge.  Activity Barriers & Risk Stratification:     Activity Barriers & Cardiac Risk Stratification - 09/25/15 1531    Activity Barriers & Cardiac Risk Stratification   Activity Barriers Arthritis;Joint Problems   Cardiac Risk Stratification High      6 Minute Walk:     6 Minute Walk      09/25/15 1509       6 Minute Walk  Phase Initial  NuStep Test      Distance 413 feet  Nu Step Test Level 1 with arms     Walk Time 6 minutes     # of Rest Breaks 0     RPE 13     Symptoms No     Resting HR 111 bpm     Resting BP 132/60 mmHg     Max Ex. HR 116 bpm     Max Ex. BP 138/54 mmHg        Initial Exercise Prescription:     Initial Exercise Prescription - 09/25/15 1500    Date of Initial Exercise RX and Referring Provider   Date 09/25/15   Treadmill   MPH 1  Use caution with treadmill and small time intervals   Grade 0   Minutes 3   Recumbant Bike   Level 2   RPM 40   Watts 15   Minutes 10   NuStep   Level 2   Watts 20   Minutes 15   Arm  Ergometer   Level 1   Watts 10   Minutes 15   Arm/Foot Ergometer   Level 4   Watts 12   Minutes 15   Recumbant Elliptical   Level 1   RPM 35   Watts 10   Minutes 10   REL-XR   Level 2   Watts 30   Minutes 15   T5 Nustep   Level 2   Watts 15   Minutes 15   Biostep-RELP   Level 2   Watts 15   Minutes 15   Prescription Details   Frequency (times per week) 3   Duration Progress to 30 minutes of continuous aerobic without signs/symptoms of physical distress   Intensity   THRR REST +  30   Ratings of Perceived Exertion 11-15   Progression   Progression Continue progressive overload as per policy without signs/symptoms or physical distress.   Resistance Training   Training Prescription Yes   Weight 2   Reps 10-15      Perform Capillary Blood Glucose checks as needed.  Exercise Prescription Changes:   Exercise Comments:     Exercise Comments      10/20/15 1158           Exercise Comments Joon has not attended Heart Track since 09-27-15. Goal will be to resume program when able.          Discharge Exercise Prescription (Final Exercise Prescription Changes):   Nutrition:  Target Goals: Understanding of nutrition guidelines, daily intake of sodium 1500mg , cholesterol 200mg , calories 30% from fat and 7% or less from saturated fats, daily to have 5 or more servings of fruits and vegetables.  Biometrics:     Pre Biometrics - 09/25/15 1508    Pre Biometrics   Height 5' 5.5" (1.664 m)   Weight 186 lb 1.6 oz (84.414 kg)   Waist Circumference 34 inches   Hip Circumference 42 inches   Waist to Hip Ratio 0.81 %   BMI (Calculated) 30.6       Nutrition Therapy Plan and Nutrition Goals:     Nutrition Therapy & Goals - 10/04/15 1707    Personal Nutrition Goals   Comments Ms. Shiel prefers not to meet with RD       Nutrition Discharge: Rate Your Plate Scores:     Nutrition Assessments - 09/28/15 1421    Rate Your Plate Scores   Pre Score 65  Pre Score % 72 %      Nutrition Goals Re-Evaluation:   Psychosocial: Target Goals: Acknowledge presence or absence of depression, maximize coping skills, provide positive support system. Participant is able to verbalize types and ability to use techniques and skills needed for reducing stress and depression.  Initial Review & Psychosocial Screening:     Initial Psych Review & Screening - 09/25/15 [place]? Yes      Quality of Life Scores:     Quality of Life - 09/25/15 1541    Quality of Life Scores   Health/Function Pre 19.5 %   Socioeconomic Pre 26.75 %   Psych/Spiritual Pre 20.64 %   Family Pre 24.88 %   GLOBAL Pre 21.59 %      PHQ-9:     Recent Review Flowsheet Data    Depression screen [facility] 2/9 09/25/2015 01/05/2015   Decreased Interest 0 0   Down, Depressed, Hopeless 0 0   PHQ - 2 Score 0 0   Altered sleeping 3 1   Tired, decreased energy 3 1   Change in appetite 0 0   Feeling bad or failure about yourself  0 0   Trouble concentrating 0 0   Moving slowly or fidgety/restless 0 0   Suicidal thoughts 0 0   PHQ-9 Score 6 2   Difficult doing work/chores Somewhat difficult -      Psychosocial Evaluation and Intervention:   Psychosocial Re-Evaluation:     Psychosocial Re-Evaluation      09/28/15 1422           Psychosocial Re-Evaluation   Comments When I checked EPIC to chart Rate your plate, I noticed Denee Lundvall is in the [place] today.        Continued Psychosocial Services Needed Yes          Vocational Rehabilitation: Provide vocational rehab assistance to qualifying candidates.   Vocational Rehab Evaluation & Intervention:     Vocational Rehab - 09/25/15 1532    Initial Vocational Rehab Evaluation & Intervention   Assessment shows need for Vocational Rehabilitation No      Education: Education Goals: Education classes will be provided on a weekly basis, covering required topics.  Participant will state understanding/return demonstration of topics presented.  Learning Barriers/Preferences:   Education Topics: General Nutrition Guidelines/Fats and Fiber: -Group instruction provided by verbal, written material, models and posters to present the general guidelines for heart healthy nutrition. Gives an explanation and review of dietary fats and fiber.   Controlling Sodium/Reading Food Labels: -Group verbal and written material supporting the discussion of sodium use in heart healthy nutrition. Review and explanation with models, verbal and written materials for utilization of the food label.          Cardiac Rehab from 09/25/2015 in [facility] Cardiac and Pulmonary Rehab   Date  12/26/14   Educator  CR   Instruction Review Code  2- meets goals/outcomes      Exercise Physiology & Risk Factors: - Group verbal and written instruction with models to review the exercise physiology of the cardiovascular system and associated critical values. Details cardiovascular disease risk factors and the goals associated with each risk factor.      Cardiac Rehab from 09/25/2015 in [facility] Cardiac and Pulmonary Rehab   Date  11/16/14   Educator  RM   Instruction Review Code  2- meets goals/outcomes      Aerobic Exercise & Resistance  Training: - Gives group verbal and written discussion on the health impact of inactivity. On the components of aerobic and resistive training programs and the benefits of this training and how to safely progress through these programs.      Cardiac Rehab from 09/25/2015 in [facility] Cardiac and Pulmonary Rehab   Date  11/23/14   Educator  RM   Instruction Review Code  2- meets goals/outcomes      Flexibility, Balance, General Exercise Guidelines: - Provides group verbal and written instruction on the benefits of flexibility and balance training programs. Provides general exercise guidelines with specific guidelines to those with heart or lung disease.  Demonstration and skill practice provided.      Cardiac Rehab from 09/25/2015 in [facility] Cardiac and Pulmonary Rehab   Date  11/21/14   Educator  SB   Instruction Review Code  2- meets goals/outcomes      Stress Management: - Provides group verbal and written instruction about the health risks of elevated stress, cause of high stress, and healthy ways to reduce stress.      Cardiac Rehab from 09/25/2015 in [facility] Cardiac and Pulmonary Rehab   Date  11/30/14   Educator  [facility]   Instruction Review Code  2- meets goals/outcomes      Depression: - Provides group verbal and written instruction on the correlation between heart/lung disease and depressed mood, treatment options, and the stigmas associated with seeking treatment.      Cardiac Rehab from 09/25/2015 in [facility] Cardiac and Pulmonary Rehab   Date  11/09/14   Educator  [facility]   Instruction Review Code  2- meets goals/outcomes      Anatomy & Physiology of the Heart: - Group verbal and written instruction and models provide basic cardiac anatomy and physiology, with the coronary electrical and arterial systems. Review of: AMI, Angina, Valve disease, Heart Failure, Cardiac Arrhythmia, Pacemakers, and the ICD.      Cardiac Rehab from 09/25/2015 in [facility] Cardiac and Pulmonary Rehab   Date  12/05/14   Educator  SB   Instruction Review Code  2- meets goals/outcomes      Cardiac Procedures: - Group verbal and written instruction and models to describe the testing methods done to diagnose heart disease. Reviews the outcomes of the test results. Describes the treatment choices: Medical Management, Angioplasty, or Coronary Bypass Surgery.      Cardiac Rehab from 09/25/2015 in [facility] Cardiac and Pulmonary Rehab   Date  11/07/14   Educator  Foster Simpson RN BSN CCRP   Instruction Review Code  2- meets goals/outcomes      Cardiac Medications: - Group verbal and written instruction to review commonly prescribed medications for heart disease. Reviews the  medication, class of the drug, and side effects. Includes the steps to properly store meds and maintain the prescription regimen.      Cardiac Rehab from 09/25/2015 in [facility] Cardiac and Pulmonary Rehab   Date  11/14/14   Educator  Foster Simpson RN CCRP   Instruction Review Code  2- meets goals/outcomes      Go Sex-Intimacy & Heart Disease, Get SMART - Goal Setting: - Group verbal and written instruction through game format to discuss heart disease and the return to sexual intimacy. Provides group verbal and written material to discuss and apply goal setting through the application of the S.M.A.R.T. Method.      Cardiac Rehab from 09/25/2015 in [facility] Cardiac and Pulmonary Rehab   Date  11/07/14   Educator  Foster Simpson RN BSN CCRP   Instruction Review Code  2- meets goals/outcomes      Other Matters of the Heart: - Provides group verbal, written materials and models to describe Heart Failure, Angina, Valve Disease, and Diabetes in the realm of heart disease. Includes description of the disease process and treatment options available to the cardiac patient.      Cardiac Rehab from 09/25/2015 in [facility] Cardiac and Pulmonary Rehab   Date  12/14/14   Educator  CE   Instruction Review Code  2- meets goals/outcomes      Exercise & Equipment Safety: - Individual verbal instruction and demonstration of equipment use and safety with use of the equipment.      Cardiac Rehab from 09/25/2015 in [facility] Cardiac and Pulmonary Rehab   Date  09/25/15   Educator  C. EnterkinRN   Instruction Review Code  1- partially meets, needs review/practice      Infection Prevention: - Provides verbal and written material to individual with discussion of infection control including proper hand washing and proper equipment cleaning during exercise session.      Cardiac Rehab from 09/25/2015 in [facility] Cardiac and Pulmonary Rehab   Date  09/25/15   Educator  C. EnterkinRN   Instruction Review Code  2- meets goals/outcomes       Falls Prevention: - Provides verbal and written material to individual with discussion of falls prevention and safety.      Cardiac Rehab from 09/25/2015 in [facility] Cardiac and Pulmonary Rehab   Date  09/25/15   Educator  C. Pennington   Instruction Review Code  1- partially meets, needs review/practice      Diabetes: - Individual verbal and written instruction to review signs/symptoms of diabetes, desired ranges of glucose level fasting, after meals and with exercise. Advice that pre and post exercise glucose checks will be done for 3 sessions at entry of program.    Knowledge Questionnaire Score:     Knowledge Questionnaire Score - 09/25/15 1532    Knowledge Questionnaire Score   Pre Score 18      Core Components/Risk Factors/Patient Goals at Admission:     Personal Goals and Risk Factors at Admission - 09/25/15 1536    Core Components/Risk Factors/Patient Goals on Admission   Increase Strength and Stamina Yes   Intervention Provide advice, education, support and counseling about physical activity/exercise needs.;Develop an individualized exercise prescription for aerobic and resistive training based on initial evaluation findings, risk stratification, comorbidities and participant's personal goals.   Expected Outcomes Achievement of increased cardiorespiratory fitness and enhanced flexibility, muscular endurance and strength shown through measurements of functional capacity and personal statement of participant.   Heart Failure Yes   Intervention Provide a combined exercise and nutrition program that is supplemented with education, support and counseling about heart failure. Directed toward relieving symptoms such as shortness of breath, decreased exercise tolerance, and extremity edema.   Expected Outcomes Improve functional capacity of life;Short term: Attendance in program 2-3 days a week with increased exercise capacity. Reported lower sodium intake. Reported increased fruit  and vegetable intake. Reports medication compliance.;Short term: Daily weights obtained and reported for increase. Utilizing diuretic protocols set by physician.;Long term: Adoption of self-care skills and reduction of barriers for early signs and symptoms recognition and intervention leading to self-care maintenance.      Core Components/Risk Factors/Patient Goals Review:      Goals and Risk Factor Review      10/18/15 1900  Core Components/Risk Factors/Patient Goals Review   Review Was in  hospital          Core Components/Risk Factors/Patient Goals at Discharge (Final Review):      Goals and Risk Factor Review - 10/18/15 1900    Core Components/Risk Factors/Patient Goals Review   Review Was in  hospital      ITP Comments:     ITP Comments      09/26/15 0825 09/28/15 1422 10/22/15 1012       ITP Comments 30 day review   Continue with ITP    New start to program has attended medical review orientation. When I checked EPIC to chart Rate your plate, I noticed Adelita Yearsley is in the [facility] Emerg Dept. 30 Day review. Continue with ITP  Last visit March 29,2017        Comments:

## 2015-10-26 ENCOUNTER — Ambulatory Visit: Payer: Medicare Other | Admitting: Nurse Practitioner

## 2015-10-30 ENCOUNTER — Encounter: Payer: Medicare Other | Attending: Cardiovascular Disease

## 2015-10-30 DIAGNOSIS — I5042 Chronic combined systolic (congestive) and diastolic (congestive) heart failure: Secondary | ICD-10-CM | POA: Insufficient documentation

## 2015-10-31 ENCOUNTER — Ambulatory Visit (INDEPENDENT_AMBULATORY_CARE_PROVIDER_SITE_OTHER): Payer: Medicare Other | Admitting: General Surgery

## 2015-10-31 ENCOUNTER — Encounter: Payer: Self-pay | Admitting: General Surgery

## 2015-10-31 VITALS — BP 104/58 | HR 60 | Resp 12 | Ht 68.5 in | Wt 176.0 lb

## 2015-10-31 DIAGNOSIS — R600 Localized edema: Secondary | ICD-10-CM | POA: Diagnosis not present

## 2015-10-31 DIAGNOSIS — G629 Polyneuropathy, unspecified: Secondary | ICD-10-CM

## 2015-10-31 MED ORDER — GABAPENTIN 100 MG PO CAPS: 100.0000 mg | ORAL_CAPSULE | Freq: Every day | ORAL | Status: DC

## 2015-10-31 NOTE — Patient Instructions (Signed)
The patient is aware to call back for any questions or concerns.  

## 2015-10-31 NOTE — Progress Notes (Signed)
Patient ID: [name], female   DOB: October 18, 1934, 81 y.o.   MRN: [id number]  Chief Complaint  Patient presents with  . Follow-up    HPI [name] is a 81 y.o. female.  Here today for her follow up leg ulcer. She is still being followed by Dr Delana Meyer.She does have a hospital bed at home that she can use. She does have sequential compression hose and velco compression hose.  When she is able to tolerate her hospital bed, the lower extremity edema improves markedly. The last 2 nights she's had pain when supine and after spending the night with her legs dependent pronounced swelling is noted.  She is accompanied today by her daughter, [name] who was present for the interview and exam.  She's been wrapped weekly at the vascular office. HPI  Past Medical History  Diagnosis Date  . Shortness of breath dyspnea   . Hypertension   . Chronic combined systolic (congestive) and diastolic (congestive) heart failure (HCC)     a. EF 25% by echo in 08/2015 b. RHC in 08/2015 showed normal filling pressures  . GERD (gastroesophageal reflux disease)   . Arthritis   . Chronic atrial fibrillation ([place])     a. On Coumadin  . S/P MVR (mitral valve replacement)     a. MVR 1994 b. redo MVR in 08/2014 - on Coumadin    Past Surgical History  Procedure Laterality Date  . Cardiac catheterization    . Pyloric stenosis    . Cataract extraction w/phaco Right 02/20/2015    Procedure: CATARACT EXTRACTION PHACO AND INTRAOCULAR LENS PLACEMENT (IOC);  Surgeon: Estill Cotta, MD;  Location: [facility] ORS;  Service: Ophthalmology;  Laterality: Right;  US01:31.2 AP 25.3% CDE40.13 Fluid lot # [id number]  . Breast surgery Right 2005?    benign lump excision  . Cardiac valve replacement  1994, 2016  . Irrigation and debridement hematoma Left 07/05/2015    Procedure: IRRIGATION AND DEBRIDEMENT HEMATOMA;  Surgeon: Robert Bellow, MD;  Location: [facility] ORS;  Service: General;  Laterality: Left;  . US  echocardiography    . Electrophysiologic study N/A 10/09/2015    Procedure: CARDIOVERSION;  Surgeon: Wellington Hampshire, MD;  Location: [facility] ORS;  Service: Cardiovascular;  Laterality: N/A;    Family History  Problem Relation Age of Onset  . Stroke Mother   . Renal Disease Father     Social History Social History  Substance Use Topics  . Smoking status: Former Research scientist (life sciences)  . Smokeless tobacco: Never Used  . Alcohol Use: No     Comment: occasional    Allergies  Allergen Reactions  . Ace Inhibitors     Other reaction(s): Cough  . Hydrocodone Itching and Nausea Only  . Mercury     Other reaction(s): Unknown  . Nickel   . Silver Dermatitis    Severe itching  . Cephalexin Rash  . Penicillins Rash    Current Outpatient Prescriptions  Medication Sig Dispense Refill  . acetaminophen (TYLENOL) 500 MG tablet Take 500 mg by mouth every 6 (six) hours as needed for mild pain, moderate pain, fever or headache.    Marland Kitchen amiodarone (PACERONE) 200 MG tablet Take 1 tablet (200 mg total) by mouth 2 (two) times daily. 60 tablet 2  . aspirin EC 81 MG tablet Take 81 mg by mouth daily.     . furosemide (LASIX) 40 MG tablet Take 1 tablet (40 mg total) by mouth daily. 30 tablet 2  . metoprolol succinate (TOPROL-XL)  25 MG 24 hr tablet Take 1 tablet (25 mg total) by mouth daily. 30 tablet 2  . Multiple Vitamin (MULTI-VITAMINS) TABS Take 1 tablet by mouth daily.     . pantoprazole (PROTONIX) 40 MG tablet Take 40 mg by mouth daily.     . potassium chloride (K-DUR) 10 MEQ tablet Take by mouth.    . vitamin C (ASCORBIC ACID) 500 MG tablet Take 500 mg by mouth daily as needed (for cold symptoms).     . warfarin (COUMADIN) 5 MG tablet Take 1 tablet (5 mg total) by mouth daily. 30 tablet 2  . gabapentin (NEURONTIN) 100 MG capsule Take 1 capsule (100 mg total) by mouth at bedtime. 90 capsule 0  . traMADol (ULTRAM) 50 MG tablet TAKE 1/2 TO 1 TABLET BY MOUTH TWICE A DAY AS NEEDED FOR PAIN  5   No current  facility-administered medications for this visit.    Review of Systems Review of Systems  Constitutional: Negative.   Respiratory: Negative.   Cardiovascular: Negative.     Blood pressure 104/58, pulse 60, resp. rate 12, height 5' 8.5" (1.74 m), weight 176 lb (79.833 kg).  Physical Exam Physical Exam  Constitutional: She is oriented to person, place, and time. She appears well-developed and well-nourished.  Cardiovascular:  Bilateral lower leg edema. Doppler pedal pulse present.  Neurological: She is alert and oriented to person, place, and time.  Skin: Skin is warm and dry.  Psychiatric: Her behavior is normal.      Assessment    Near complete healing of the previously large ulcerated area on the medial aspect of the left distal calf.  Profound lower extremity edema secondary to venous insufficiency and poor cardiac output.    Plan    The patient will use her compressive pneumatic stockings at night. With reports of lower extremity pain and the possibility that there is a neuropathic component she was given a prescription for Neurontin 100 mg to use nightly for the next 7-10 days. Should give a phone report at that time with her results with this medication.    Follow up as needed.   PCP:  [name] This information has been scribed by Karie Fetch RN, BSN,BC.    Robert Bellow 11/01/2015, 5:06 PM

## 2015-11-06 ENCOUNTER — Telehealth: Payer: Self-pay | Admitting: *Deleted

## 2015-11-06 NOTE — Telephone Encounter (Signed)
Spoke with [name] about is mother returning to Cardiac Rehab.  [name] has had recent admit for heart failure with discharge April, 1.  She can return May 15 if  ,

## 2015-11-06 NOTE — Telephone Encounter (Signed)
Shanera's son called/returned his call.  She is ready to return to Cardiac Rehab.  Had recent admission for Heart Failure.  Will be able to return after 5/15 with MD clearance.  Will send return to program note to Dr [name].

## 2015-11-06 NOTE — Telephone Encounter (Signed)
Patient called and is concerned because gabapentin is not helping with the pain, She wants to see if she can take it more than once a day.

## 2015-11-09 NOTE — Telephone Encounter (Signed)
Notified patient as instructed, patient pleased. Will call back if she has any other questions.

## 2015-11-09 NOTE — Telephone Encounter (Signed)
Increase to 100 mg twice a day for one week, if no improvement increase to 100 mg three times a day.

## 2015-11-15 ENCOUNTER — Encounter (HOSPITAL_COMMUNITY): Payer: Self-pay | Admitting: Internal Medicine

## 2015-11-15 ENCOUNTER — Ambulatory Visit (HOSPITAL_COMMUNITY)
Admission: RE | Admit: 2015-11-15 | Discharge: 2015-11-15 | Disposition: A | Discharge status: Home / Self Care | Payer: Medicare Other | Source: Ambulatory Visit | Attending: Internal Medicine | Admitting: Internal Medicine

## 2015-11-15 VITALS — BP 105/52 | HR 62 | Resp 18 | Wt 174.5 lb

## 2015-11-15 DIAGNOSIS — I4891 Unspecified atrial fibrillation: Secondary | ICD-10-CM | POA: Diagnosis not present

## 2015-11-15 DIAGNOSIS — Z952 Presence of prosthetic heart valve: Secondary | ICD-10-CM | POA: Insufficient documentation

## 2015-11-15 DIAGNOSIS — N183 Chronic kidney disease, stage 3 (moderate): Secondary | ICD-10-CM | POA: Insufficient documentation

## 2015-11-15 DIAGNOSIS — Z888 Allergy status to other drugs, medicaments and biological substances status: Secondary | ICD-10-CM | POA: Diagnosis not present

## 2015-11-15 DIAGNOSIS — I480 Paroxysmal atrial fibrillation: Secondary | ICD-10-CM | POA: Insufficient documentation

## 2015-11-15 DIAGNOSIS — Z7901 Long term (current) use of anticoagulants: Secondary | ICD-10-CM | POA: Diagnosis not present

## 2015-11-15 DIAGNOSIS — K219 Gastro-esophageal reflux disease without esophagitis: Secondary | ICD-10-CM | POA: Insufficient documentation

## 2015-11-15 DIAGNOSIS — Z880 Allergy status to penicillin: Secondary | ICD-10-CM | POA: Insufficient documentation

## 2015-11-15 DIAGNOSIS — Z87891 Personal history of nicotine dependence: Secondary | ICD-10-CM | POA: Diagnosis not present

## 2015-11-15 DIAGNOSIS — Z841 Family history of disorders of kidney and ureter: Secondary | ICD-10-CM | POA: Diagnosis not present

## 2015-11-15 DIAGNOSIS — I890 Lymphedema, not elsewhere classified: Secondary | ICD-10-CM | POA: Insufficient documentation

## 2015-11-15 DIAGNOSIS — Z954 Presence of other heart-valve replacement: Secondary | ICD-10-CM

## 2015-11-15 DIAGNOSIS — R600 Localized edema: Secondary | ICD-10-CM

## 2015-11-15 DIAGNOSIS — I130 Hypertensive heart and chronic kidney disease with heart failure and stage 1 through stage 4 chronic kidney disease, or unspecified chronic kidney disease: Secondary | ICD-10-CM | POA: Insufficient documentation

## 2015-11-15 DIAGNOSIS — Z885 Allergy status to narcotic agent status: Secondary | ICD-10-CM | POA: Diagnosis not present

## 2015-11-15 DIAGNOSIS — I5022 Chronic systolic (congestive) heart failure: Secondary | ICD-10-CM | POA: Diagnosis present

## 2015-11-15 DIAGNOSIS — Z823 Family history of stroke: Secondary | ICD-10-CM | POA: Insufficient documentation

## 2015-11-15 DIAGNOSIS — Z7982 Long term (current) use of aspirin: Secondary | ICD-10-CM | POA: Diagnosis not present

## 2015-11-15 DIAGNOSIS — Z79899 Other long term (current) drug therapy: Secondary | ICD-10-CM | POA: Diagnosis not present

## 2015-11-15 NOTE — Progress Notes (Signed)
Patient ID: [name], female   DOB: [birth year], 81 y.o.   MRN: [id number]   ADVANCED HF CLINIC CONSULT NOTE  Referring: Dr. Tressia [facility]) Primary Cardiologist: Dr. Derinda Sis  HPI:  Ms. Seeds is 81 y/o with h/o systolic HF (most recent EF 40-45%), mitral regurgitation due to endocarditis s/p mechanical MVR 1994 with re-do 2016, chronic LE edema, HTN and PAF who presents for further evaluation of LE edema.  She as been followed closely by Derinda Sis and the [facility] group at [facility]. On 09/01/15 she had RHC with Dr. Mina Marble and numbers looked good despite edema. Felt to have significant component of lymphedema.   [facility] RHC 09/01/15  RA 11 RV 52/12 PA 52/20 (30) PCWP 18 CO/CI = 5.3/2.7 PVR 2.3 WU  Recently in [facility] for recurrent PAF and had DC-CV 10/09/15. Referred here to HF Clinic.   She is here with her daughter. She remains quite frustrated wit her LE edema and continues to ask what else can be done. In reviewing her records, she has several hospitalizations for volume overload but has also been hospitalized for volume depletion and renal failure. Recently switched back from torsemide to lasix. Taking lasix 60 daily and metolazone 2.5 qod. Wears compression sleeves. Has stable exertional dyspnea. No CP. Watches fluid and salt intake closely. Denies orthopnea or PND. Daughter has Carrie Klippel PA-C cell number and texts her as needed with issues.    Review of Systems: [y] = yes, [ ]  = no   General: Weight gain Blue.Reese ]; Weight loss [ ] ; Anorexia [ ] ; Fatigue Blue.Reese ]; Fever [ ] ; Chills [ ] ; Weakness [ ]   Cardiac: Chest pain/pressure [ ] ; Resting SOB [ ] ; Exertional SOB [ ] ; Orthopnea [ ] ; Pedal Edema [ ] ; Palpitations [ ] ; Syncope [ ] ; Presyncope [ ] ; Paroxysmal nocturnal dyspnea[ ]   Pulmonary: Cough [ ] ; Wheezing[ ] ; Hemoptysis[ ] ; Sputum [ ] ; Snoring [ ]   GI: Vomiting[ ] ; Dysphagia[ ] ; Melena[ ] ; Hematochezia [ ] ; Heartburn[ ] ; Abdominal pain [ ] ; Constipation [ ] ; Diarrhea [ ] ; BRBPR [  ]  GU: Hematuria[ ] ; Dysuria [ ] ; Nocturia[ ]   Vascular: Pain in legs with walking [ ] ; Pain in feet with lying flat [ ] ; Non-healing sores [ ] ; Stroke [ ] ; TIA [ ] ; Slurred speech [ ] ;  Neuro: Headaches[ ] ; Vertigo[ ] ; Seizures[ ] ; Paresthesias[ ] ;Blurred vision [ ] ; Diplopia [ ] ; Vision changes [ ]   Ortho/Skin: Arthritis Blue.Reese ]; Joint pain Blue.Reese ]; Muscle pain [ ] ; Joint swelling [ ] ; Back Pain [ ] ; Rash [ ]   Psych: Depression[ ] ; Anxiety[y ]  Heme: Bleeding problems [ ] ; Clotting disorders [ ] ; Anemia [ ]   Endocrine: Diabetes [ ] ; Thyroid dysfunction[ ]    Past Medical History  Diagnosis Date  . Shortness of breath dyspnea   . Hypertension   . Chronic combined systolic (congestive) and diastolic (congestive) heart failure (HCC)     a. EF 25% by echo in 08/2015 b. RHC in 08/2015 showed normal filling pressures  . GERD (gastroesophageal reflux disease)   . Arthritis   . Chronic atrial fibrillation ([place])     a. On Coumadin  . S/P MVR (mitral valve replacement)     a. MVR 1994 b. redo MVR in 08/2014 - on Coumadin    Current Outpatient Prescriptions  Medication Sig Dispense Refill  . amiodarone (PACERONE) 200 MG tablet Take 200 mg by mouth daily.    Marland Kitchen aspirin EC 81 MG tablet Take 81  mg by mouth daily.     . furosemide (LASIX) 40 MG tablet Take 60 mg by mouth daily.    Marland Kitchen gabapentin (NEURONTIN) 100 MG capsule Take 1 capsule (100 mg total) by mouth at bedtime. 90 capsule 0  . metoprolol succinate (TOPROL-XL) 25 MG 24 hr tablet Take 1 tablet (25 mg total) by mouth daily. 30 tablet 2  . Multiple Vitamin (MULTI-VITAMINS) TABS Take 1 tablet by mouth daily.     . pantoprazole (PROTONIX) 40 MG tablet Take 40 mg by mouth daily.     . potassium chloride (K-DUR) 10 MEQ tablet Take by mouth.    . spironolactone (ALDACTONE) 25 MG tablet Take 25 mg by mouth daily.    . traMADol (ULTRAM) 50 MG tablet TAKE 1/2 TO 1 TABLET BY MOUTH TWICE A DAY AS NEEDED FOR PAIN  5  . vitamin C (ASCORBIC ACID) 500 MG  tablet Take 500 mg by mouth daily as needed (for cold symptoms).     . warfarin (COUMADIN) 5 MG tablet Take 1 tablet (5 mg total) by mouth daily. 30 tablet 2  . acetaminophen (TYLENOL) 500 MG tablet Take 500 mg by mouth every 6 (six) hours as needed for mild pain, moderate pain, fever or headache.     No current facility-administered medications for this encounter.    Allergies  Allergen Reactions  . Ace Inhibitors     Other reaction(s): Cough  . Hydrocodone Itching and Nausea Only  . Mercury     Other reaction(s): Unknown  . Nickel   . Silver Dermatitis    Severe itching  . Cephalexin Rash  . Penicillins Rash      Social History   Social History  . Marital Status: Widowed    Spouse Name: N/A  . Number of Children: N/A  . Years of Education: N/A   Occupational History  . Not on file.   Social History Main Topics  . Smoking status: Former Research scientist (life sciences)  . Smokeless tobacco: Never Used  . Alcohol Use: No     Comment: occasional  . Drug Use: No  . Sexual Activity: Not on file   Other Topics Concern  . Not on file   Social History Narrative      Family History  Problem Relation Age of Onset  . Stroke Mother   . Renal Disease Father     Danley Danker Vitals:   11/15/15 1210  BP: 105/52  Pulse: 62  Resp: 18  Weight: 174 lb 8 oz (79.153 kg)  SpO2: 98%    PHYSICAL EXAM: General: Elderly appearing. No respiratory difficulty HEENT: normal Neck: supple. JVP 7-8 Carotids 2+ bilat; no bruits. No lymphadenopathy or thryomegaly appreciated. Cor: PMI nondisplaced. Regular rate & rhythm. 2/6 TR mechanical s1 Lungs: clear Abdomen: soft, nontender, nondistended. No hepatosplenomegaly. No bruits or masses. Good bowel sounds. Extremities: no cyanosis, clubbing, rash, 2+ edema with chronic venous stasis changes. 3 sores on LLE Neuro: alert & oriented x 3, cranial nerves grossly intact. moves all 4 extremities w/o difficulty. Affect pleasant.   ASSESSMENT & PLAN: 1. Chronic  systolic HF 2. H/o MV endocarditis s/p mechanical MVR 1994, redo 2016 3. Lymphedema 4. PAF s/p recent DCCV 5. 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 I told her that I had really nothing else  to offer her at this point except consideration of possible Cardiomems device to help manage central volume status more closely. Stressed need to be compliant with compression devices and keep legs elevated when she could. While they were in the Clinic, I contacted [name] to [facility] and brought up the possibility of Cardiomems with her as well. They will follow up PRN.   Carrington Olazabal,MD 12:54 AM

## 2015-11-15 NOTE — Patient Instructions (Signed)
Follow up as needed for any cardiac needs with Dr. Haroldine Laws!.  Do the following things EVERYDAY: 1) Weigh yourself in the morning before breakfast. Write it down and keep it in a log. 2) Take your medicines as prescribed 3) Eat low salt foods-Limit salt (sodium) to 2000 mg per day.  4) Stay as active as you can everyday 5) Limit all fluids for the day to less than 2 liters

## 2015-11-19 ENCOUNTER — Encounter: Payer: Self-pay | Admitting: *Deleted

## 2015-11-19 DIAGNOSIS — Z952 Presence of prosthetic heart valve: Secondary | ICD-10-CM

## 2015-11-19 DIAGNOSIS — I5022 Chronic systolic (congestive) heart failure: Secondary | ICD-10-CM

## 2015-11-19 DIAGNOSIS — I5042 Chronic combined systolic (congestive) and diastolic (congestive) heart failure: Secondary | ICD-10-CM

## 2015-11-19 DIAGNOSIS — I5023 Acute on chronic systolic (congestive) heart failure: Secondary | ICD-10-CM | POA: Insufficient documentation

## 2015-11-19 NOTE — Progress Notes (Signed)
Cardiac Individual Treatment Plan  Patient Details  Name: [name] MRN: [id number] Date of Birth: [birth year] Referring Provider:    Initial Encounter Date:       Cardiac Rehab from 09/25/2015 in [facility] Cardiac and Pulmonary Rehab   Date  09/25/15      Visit Diagnosis: S/P MVR (mitral valve replacement)  Chronic combined systolic and diastolic congestive heart failure ([place])  Patient's Home Medications on Admission:  Current outpatient prescriptions:  .  acetaminophen (TYLENOL) 500 MG tablet, Take 500 mg by mouth every 6 (six) hours as needed for mild pain, moderate pain, fever or headache., Disp: , Rfl:  .  amiodarone (PACERONE) 200 MG tablet, Take 200 mg by mouth daily., Disp: , Rfl:  .  aspirin EC 81 MG tablet, Take 81 mg by mouth daily. , Disp: , Rfl:  .  furosemide (LASIX) 40 MG tablet, Take 60 mg by mouth daily., Disp: , Rfl:  .  gabapentin (NEURONTIN) 100 MG capsule, Take 1 capsule (100 mg total) by mouth at bedtime., Disp: 90 capsule, Rfl: 0 .  metoprolol succinate (TOPROL-XL) 25 MG 24 hr tablet, Take 1 tablet (25 mg total) by mouth daily., Disp: 30 tablet, Rfl: 2 .  Multiple Vitamin (MULTI-VITAMINS) TABS, Take 1 tablet by mouth daily. , Disp: , Rfl:  .  pantoprazole (PROTONIX) 40 MG tablet, Take 40 mg by mouth daily. , Disp: , Rfl:  .  potassium chloride (K-DUR) 10 MEQ tablet, Take by mouth., Disp: , Rfl:  .  spironolactone (ALDACTONE) 25 MG tablet, Take 25 mg by mouth daily., Disp: , Rfl:  .  traMADol (ULTRAM) 50 MG tablet, TAKE 1/2 TO 1 TABLET BY MOUTH TWICE A DAY AS NEEDED FOR PAIN, Disp: , Rfl: 5 .  vitamin C (ASCORBIC ACID) 500 MG tablet, Take 500 mg by mouth daily as needed (for cold symptoms). , Disp: , Rfl:  .  warfarin (COUMADIN) 5 MG tablet, Take 1 tablet (5 mg total) by mouth daily., Disp: 30 tablet, Rfl: 2  Past Medical History: Past Medical History  Diagnosis Date  . Shortness of breath dyspnea   . Hypertension   . Chronic combined systolic  (congestive) and diastolic (congestive) heart failure (HCC)     a. EF 25% by echo in 08/2015 b. RHC in 08/2015 showed normal filling pressures  . GERD (gastroesophageal reflux disease)   . Arthritis   . Chronic atrial fibrillation ([place])     a. On Coumadin  . S/P MVR (mitral valve replacement)     a. MVR 1994 b. redo MVR in 08/2014 - on Coumadin    Tobacco Use: History  Smoking status  . Former Smoker  Smokeless tobacco  . Never Used    Labs: Recent Review Flowsheet Data    There is no flowsheet data to display.       Exercise Target Goals:    Exercise Program Goal: Individual exercise prescription set with THRR, safety & activity barriers. Participant demonstrates ability to understand and report RPE using BORG scale, to self-measure pulse accurately, and to acknowledge the importance of the exercise prescription.  Exercise Prescription Goal: Starting with aerobic activity 30 plus minutes a day, 3 days per week for initial exercise prescription. Provide home exercise prescription and guidelines that participant acknowledges understanding prior to discharge.  Activity Barriers & Risk Stratification:     Activity Barriers & Cardiac Risk Stratification - 09/25/15 1531    Activity Barriers & Cardiac Risk Stratification   Activity Barriers Arthritis;Joint Problems  Cardiac Risk Stratification High      6 Minute Walk:     6 Minute Walk      09/25/15 1509       6 Minute Walk   Phase Initial  NuStep Test      Distance 413 feet  Nu Step Test Level 1 with arms     Walk Time 6 minutes     # of Rest Breaks 0     RPE 13     Symptoms No     Resting HR 111 bpm     Resting BP 132/60 mmHg     Max Ex. HR 116 bpm     Max Ex. BP 138/54 mmHg        Initial Exercise Prescription:     Initial Exercise Prescription - 09/25/15 1500    Date of Initial Exercise RX and Referring Provider   Date 09/25/15   Treadmill   MPH 1  Use caution with treadmill and small time  intervals   Grade 0   Minutes 3   Recumbant Bike   Level 2   RPM 40   Watts 15   Minutes 10   NuStep   Level 2   Watts 20   Minutes 15   Arm Ergometer   Level 1   Watts 10   Minutes 15   Arm/Foot Ergometer   Level 4   Watts 12   Minutes 15   Recumbant Elliptical   Level 1   RPM 35   Watts 10   Minutes 10   REL-XR   Level 2   Watts 30   Minutes 15   T5 Nustep   Level 2   Watts 15   Minutes 15   Biostep-RELP   Level 2   Watts 15   Minutes 15   Prescription Details   Frequency (times per week) 3   Duration Progress to 30 minutes of continuous aerobic without signs/symptoms of physical distress   Intensity   THRR REST +  30   Ratings of Perceived Exertion 11-15   Progression   Progression Continue progressive overload as per policy without signs/symptoms or physical distress.   Resistance Training   Training Prescription Yes   Weight 2   Reps 10-15      Perform Capillary Blood Glucose checks as needed.  Exercise Prescription Changes:   Exercise Comments:     Exercise Comments      10/20/15 1158 11/16/15 1540         Exercise Comments Ledell has not attended Heart [facility] since 09-27-15. Goal will be to resume program when able. Aalya has not attended since 3-29.         Discharge Exercise Prescription (Final Exercise Prescription Changes):   Nutrition:  Target Goals: Understanding of nutrition guidelines, daily intake of sodium 1500mg , cholesterol 200mg , calories 30% from fat and 7% or less from saturated fats, daily to have 5 or more servings of fruits and vegetables.  Biometrics:     Pre Biometrics - 09/25/15 1508    Pre Biometrics   Height 5' 5.5" (1.664 m)   Weight 186 lb 1.6 oz (84.414 kg)   Waist Circumference 34 inches   Hip Circumference 42 inches   Waist to Hip Ratio 0.81 %   BMI (Calculated) 30.6       Nutrition Therapy Plan and Nutrition Goals:     Nutrition Therapy & Goals - 10/04/15 1707    Personal Nutrition  Goals  Comments Ms. Graven prefers not to meet with RD       Nutrition Discharge: Rate Your Plate Scores:     Nutrition Assessments - 09/28/15 1421    Rate Your Plate Scores   Pre Score 65   Pre Score % 72 %      Nutrition Goals Re-Evaluation:   Psychosocial: Target Goals: Acknowledge presence or absence of depression, maximize coping skills, provide positive support system. Participant is able to verbalize types and ability to use techniques and skills needed for reducing stress and depression.  Initial Review & Psychosocial Screening:     Initial Psych Review & Screening - 09/25/15 [place]? Yes      Quality of Life Scores:     Quality of Life - 09/25/15 1541    Quality of Life Scores   Health/Function Pre 19.5 %   Socioeconomic Pre 26.75 %   Psych/Spiritual Pre 20.64 %   Family Pre 24.88 %   GLOBAL Pre 21.59 %      PHQ-9:     Recent Review Flowsheet Data    Depression screen [facility] 2/9 09/25/2015 01/05/2015   Decreased Interest 0 0   Down, Depressed, Hopeless 0 0   PHQ - 2 Score 0 0   Altered sleeping 3 1   Tired, decreased energy 3 1   Change in appetite 0 0   Feeling bad or failure about yourself  0 0   Trouble concentrating 0 0   Moving slowly or fidgety/restless 0 0   Suicidal thoughts 0 0   PHQ-9 Score 6 2   Difficult doing work/chores Somewhat difficult -      Psychosocial Evaluation and Intervention:   Psychosocial Re-Evaluation:     Psychosocial Re-Evaluation      09/28/15 1422           Psychosocial Re-Evaluation   Comments When I checked EPIC to chart Rate your plate, I noticed Rethel Wehe is in the [place] today.        Continued Psychosocial Services Needed Yes          Vocational Rehabilitation: Provide vocational rehab assistance to qualifying candidates.   Vocational Rehab Evaluation & Intervention:     Vocational Rehab - 09/25/15 1532    Initial Vocational Rehab  Evaluation & Intervention   Assessment shows need for Vocational Rehabilitation No      Education: Education Goals: Education classes will be provided on a weekly basis, covering required topics. Participant will state understanding/return demonstration of topics presented.  Learning Barriers/Preferences:   Education Topics: General Nutrition Guidelines/Fats and Fiber: -Group instruction provided by verbal, written material, models and posters to present the general guidelines for heart healthy nutrition. Gives an explanation and review of dietary fats and fiber.   Controlling Sodium/Reading Food Labels: -Group verbal and written material supporting the discussion of sodium use in heart healthy nutrition. Review and explanation with models, verbal and written materials for utilization of the food label.          Cardiac Rehab from 09/25/2015 in [facility] Cardiac and Pulmonary Rehab   Date  12/26/14   Educator  CR   Instruction Review Code  2- meets goals/outcomes      Exercise Physiology & Risk Factors: - Group verbal and written instruction with models to review the exercise physiology of the cardiovascular system and associated critical values. Details cardiovascular disease risk factors and the goals associated with each risk  factor.      Cardiac Rehab from 09/25/2015 in [facility] Cardiac and Pulmonary Rehab   Date  11/16/14   Educator  RM   Instruction Review Code  2- meets goals/outcomes      Aerobic Exercise & Resistance Training: - Gives group verbal and written discussion on the health impact of inactivity. On the components of aerobic and resistive training programs and the benefits of this training and how to safely progress through these programs.      Cardiac Rehab from 09/25/2015 in [facility] Cardiac and Pulmonary Rehab   Date  11/23/14   Educator  RM   Instruction Review Code  2- meets goals/outcomes      Flexibility, Balance, General Exercise Guidelines: - Provides group  verbal and written instruction on the benefits of flexibility and balance training programs. Provides general exercise guidelines with specific guidelines to those with heart or lung disease. Demonstration and skill practice provided.      Cardiac Rehab from 09/25/2015 in [facility] Cardiac and Pulmonary Rehab   Date  11/21/14   Educator  SB   Instruction Review Code  2- meets goals/outcomes      Stress Management: - Provides group verbal and written instruction about the health risks of elevated stress, cause of high stress, and healthy ways to reduce stress.      Cardiac Rehab from 09/25/2015 in [facility] Cardiac and Pulmonary Rehab   Date  11/30/14   Educator  [facility]   Instruction Review Code  2- meets goals/outcomes      Depression: - Provides group verbal and written instruction on the correlation between heart/lung disease and depressed mood, treatment options, and the stigmas associated with seeking treatment.      Cardiac Rehab from 09/25/2015 in [facility] Cardiac and Pulmonary Rehab   Date  11/09/14   Educator  [facility]   Instruction Review Code  2- meets goals/outcomes      Anatomy & Physiology of the Heart: - Group verbal and written instruction and models provide basic cardiac anatomy and physiology, with the coronary electrical and arterial systems. Review of: AMI, Angina, Valve disease, Heart Failure, Cardiac Arrhythmia, Pacemakers, and the ICD.      Cardiac Rehab from 09/25/2015 in [facility] Cardiac and Pulmonary Rehab   Date  12/05/14   Educator  SB   Instruction Review Code  2- meets goals/outcomes      Cardiac Procedures: - Group verbal and written instruction and models to describe the testing methods done to diagnose heart disease. Reviews the outcomes of the test results. Describes the treatment choices: Medical Management, Angioplasty, or Coronary Bypass Surgery.      Cardiac Rehab from 09/25/2015 in [facility] Cardiac and Pulmonary Rehab   Date  11/07/14   Educator  Foster Simpson RN BSN CCRP    Instruction Review Code  2- meets goals/outcomes      Cardiac Medications: - Group verbal and written instruction to review commonly prescribed medications for heart disease. Reviews the medication, class of the drug, and side effects. Includes the steps to properly store meds and maintain the prescription regimen.      Cardiac Rehab from 09/25/2015 in [facility] Cardiac and Pulmonary Rehab   Date  11/14/14   Educator  Foster Simpson RN CCRP   Instruction Review Code  2- meets goals/outcomes      Go Sex-Intimacy & Heart Disease, Get SMART - Goal Setting: - Group verbal and written instruction through game format to discuss heart disease and the return to sexual  intimacy. Provides group verbal and written material to discuss and apply goal setting through the application of the S.M.A.R.T. Method.      Cardiac Rehab from 09/25/2015 in [facility] Cardiac and Pulmonary Rehab   Date  11/07/14   Educator  Foster Simpson RN BSN CCRP   Instruction Review Code  2- meets goals/outcomes      Other Matters of the Heart: - Provides group verbal, written materials and models to describe Heart Failure, Angina, Valve Disease, and Diabetes in the realm of heart disease. Includes description of the disease process and treatment options available to the cardiac patient.      Cardiac Rehab from 09/25/2015 in [facility] Cardiac and Pulmonary Rehab   Date  12/14/14   Educator  CE   Instruction Review Code  2- meets goals/outcomes      Exercise & Equipment Safety: - Individual verbal instruction and demonstration of equipment use and safety with use of the equipment.      Cardiac Rehab from 09/25/2015 in [facility] Cardiac and Pulmonary Rehab   Date  09/25/15   Educator  C. EnterkinRN   Instruction Review Code  1- partially meets, needs review/practice      Infection Prevention: - Provides verbal and written material to individual with discussion of infection control including proper hand washing and proper equipment cleaning during  exercise session.      Cardiac Rehab from 09/25/2015 in [facility] Cardiac and Pulmonary Rehab   Date  09/25/15   Educator  C. EnterkinRN   Instruction Review Code  2- meets goals/outcomes      Falls Prevention: - Provides verbal and written material to individual with discussion of falls prevention and safety.      Cardiac Rehab from 09/25/2015 in [facility] Cardiac and Pulmonary Rehab   Date  09/25/15   Educator  C. Boothwyn   Instruction Review Code  1- partially meets, needs review/practice      Diabetes: - Individual verbal and written instruction to review signs/symptoms of diabetes, desired ranges of glucose level fasting, after meals and with exercise. Advice that pre and post exercise glucose checks will be done for 3 sessions at entry of program.    Knowledge Questionnaire Score:     Knowledge Questionnaire Score - 09/25/15 1532    Knowledge Questionnaire Score   Pre Score 18      Core Components/Risk Factors/Patient Goals at Admission:     Personal Goals and Risk Factors at Admission - 09/25/15 1536    Core Components/Risk Factors/Patient Goals on Admission   Increase Strength and Stamina Yes   Intervention Provide advice, education, support and counseling about physical activity/exercise needs.;Develop an individualized exercise prescription for aerobic and resistive training based on initial evaluation findings, risk stratification, comorbidities and participant's personal goals.   Expected Outcomes Achievement of increased cardiorespiratory fitness and enhanced flexibility, muscular endurance and strength shown through measurements of functional capacity and personal statement of participant.   Heart Failure Yes   Intervention Provide a combined exercise and nutrition program that is supplemented with education, support and counseling about heart failure. Directed toward relieving symptoms such as shortness of breath, decreased exercise tolerance, and extremity edema.    Expected Outcomes Improve functional capacity of life;Short term: Attendance in program 2-3 days a week with increased exercise capacity. Reported lower sodium intake. Reported increased fruit and vegetable intake. Reports medication compliance.;Short term: Daily weights obtained and reported for increase. Utilizing diuretic protocols set by physician.;Long term: Adoption of self-care skills and reduction of  barriers for early signs and symptoms recognition and intervention leading to self-care maintenance.      Core Components/Risk Factors/Patient Goals Review:      Goals and Risk Factor Review      10/18/15 1900           Core Components/Risk Factors/Patient Goals Review   Review Was in  hospital          Core Components/Risk Factors/Patient Goals at Discharge (Final Review):      Goals and Risk Factor Review - 10/18/15 1900    Core Components/Risk Factors/Patient Goals Review   Review Was in  hospital      ITP Comments:     ITP Comments      09/26/15 0825 09/28/15 1422 10/22/15 1012 11/19/15 1112     ITP Comments 30 day review   Continue with ITP    New start to program has attended medical review orientation. When I checked EPIC to chart Rate your plate, I noticed Donique Hogenson is in the [facility] Emerg Dept. 30 Day review. Continue with ITP  Last visit March 29,2017 30 day review. Continue with ITP. Remains out after hospital admit for HF.  Waiting for MD clearance.       Comments:

## 2015-11-22 ENCOUNTER — Encounter: Payer: Medicare Other | Admitting: Internal Medicine

## 2015-11-22 DIAGNOSIS — I5042 Chronic combined systolic (congestive) and diastolic (congestive) heart failure: Secondary | ICD-10-CM | POA: Diagnosis present

## 2015-11-23 NOTE — Progress Notes (Signed)
Carrie, [name] ([id number]) Visit Report for 11/22/2015 Chief Complaint Document Details Carrie, [name] Date of Service: 11/22/2015 8:45 AM Patient Name: C. Patient Account Number: [id number] Medical Record Treating RN: Baruch Gouty RN, BSN, Velva Harman [id number] Number: Other Clinician: 1934-12-09 (81 y.o. Treating Carrie, [name] Date of Birth/Sex: Female) Physician/Extender: G Primary Care [name] Physician: Referring Physician: [name] in Treatment: 0 Information Obtained from: Patient Chief Complaint Patient is here for review of bilateral lower extremity wounds dating back to December 2016 Electronic Signature(s) Signed: 11/22/2015 4:27:33 PM By: [name] MD Entered By: [name] on 11/22/2015 10:00:01 [name] ([id number]) -------------------------------------------------------------------------------- Debridement Details [name] Date of Service: 11/22/2015 8:45 AM Patient Name: C. Patient Account Number: [id number] Medical Record Treating RN: Baruch Gouty RN, BSN, Velva Harman [id number] Number: Other Clinician: December 13, 1934 (81 y.o. Treating Carrie, [name] Date of Birth/Sex: Female) Physician/Extender: G Primary Care [name] Physician: Referring Physician: [name] in Treatment: 0 Debridement Performed for Wound #1 Left,Medial Lower Leg Assessment: Performed By: Physician [name], MD Debridement: Debridement Pre-procedure No Verification/Time Out Taken: Start Time: 09:40 Pain Control: Lidocaine 4% Topical Solution Level: Skin/Subcutaneous Tissue Total Area Debrided (L x 9 (cm) x 6.5 (cm) = 58.5 (cm) W): Tissue and other Non-Viable, Fibrin/Slough, Subcutaneous material debrided: Instrument: Curette Bleeding: Minimum Hemostasis Achieved: Pressure End Time: 09:44 Procedural Pain: 0 Post Procedural Pain: 0 Response to Treatment: Procedure was tolerated well Post Debridement Measurements of Total  Wound Length: (cm) 9 Width: (cm) 6.5 Depth: (cm) 0.2 Volume: (cm) 9.189 Post Procedure Diagnosis Same as Pre-procedure Electronic Signature(s) Signed: 11/22/2015 4:27:33 PM By: [name] MD Signed: 11/22/2015 4:54:14 PM By: Regan [name] BSN, RN Entered By: [name] on 11/22/2015 09:59:12 [place], [name] ([id number]) [name], [name] ([id number]) -------------------------------------------------------------------------------- HPI Details [name] Date of Service: 11/22/2015 8:45 AM Patient Name: C. Patient Account Number: [id number] Medical Record Treating RN: Baruch Gouty RN, BSN, Velva Harman [id number] Number: Other Clinician: 03/19/1935 (81 y.o. Treating Carrie, [name] Date of Birth/Sex: Female) Physician/Extender: G Primary Care [name] Physician: Referring Physician: [name] in Treatment: 0 History of Present Illness HPI Description: 11/22/15; this is [name] is a 81 year old woman who lives at home on her own. According to the patient and her daughter was present she has had long-standing edema in her legs dating back many years. She also has a history of chronic systolic heart failure, atrial fibrillation and is status post mitral valve replacement. [facility] showed an ejection fraction of 40-45% she is on Lasix 60 mg a day and spironolactone 25 mg a day. Her current problem began in December around Christmas time she developed a small hematoma in the medial part of her left leg which rapidly expanded to a very large hematoma that required surgical debridement. This situation was complicated by the fact that the patient is on long-standing Coumadin for mechanical heart valve. She went to the OR had this evacuated on January 4 17. The wound has gradually improved however she has developed a small wounds around this area and more recently a wound on the right lateral leg. She is weeping edema fluid. The  patient is already been to see vascular surgery. It was recommended that she wear Unna boots, she did not tolerate this due to pain in the left leg. She was then prescribed Juzo stockings and really can't get these on herself although truthfully there is probably too much edema for a Juzo stockings currently. She is not a diabetic and  has no history of PAD or claudication that I could elicit. She does not use the external compression pumps reliably. She comes today with notes from her primary physician and Dr. Ronalee Belts both recommending various forms of compression but the patient does not really complied with them. Has been using the external compression pumps with some regularity but certainly not daily on the right leg and this has helped. I also note that her daughter tells me the history that she does not sleep in bed at home. She has a hospital bed but with her legs up she finds this painful so she sleeps in the couch sitting up with her legs dependent. Electronic Signature(s) Signed: 11/22/2015 4:27:33 PM By: [name] MD Entered By: [name] on 11/22/2015 10:06:35 [name] ([id number]) -------------------------------------------------------------------------------- Physical Exam Details [name] Date of Service: 11/22/2015 8:45 AM Patient Name: C. Patient Account Number: [id number] Medical Record Treating RN: Baruch Gouty RN, BSN, Velva Harman [id number] Number: Other Clinician: 1934/12/27 (81 y.o. Treating Carrie, [name] Date of Birth/Sex: Female) Physician/Extender: G Primary Care [name] Physician: Referring Physician: [name] in Treatment: 0 Constitutional Sitting or standing Blood Pressure is within target range for patient.Marland Kitchen Heart rate irregular and bradycardic in the 50s. Respirations regular, non-labored and within target range.. Temperature is normal and within the target range for the patient.. Patient's appearance is neat and clean.  Appears in no acute distress. Well nourished and well developed.. Eyes Conjunctivae clear. No discharge. No scleral icterus. Respiratory Respiratory effort is easy and symmetric bilaterally. Rate is normal at rest and on room air.. Bilateral breath sounds are clear and equal in all lobes with no wheezes, rales or rhonchi.. Cardiovascular 3/6 pansystolic murmur at the lower left sternal border suggest tricuspid regurgitation mechanical S1 no S3. Femoral pulses are palpable but not particularly vibrant I could not feel popliteal pulses. Pedal pulses are strong bilaterally. Capillary refill time seems quite normal. Edema present in both extremities. Left greater than right to. This is nonpitting. Lymphatic None palpable in the popliteal and inguinal area. Integumentary (Hair, Skin) Venous insufficiency with inflammation and the skin bilaterally in her lower extremities below the knees. Psychiatric No evidence of depression, anxiety, or agitation. Calm, cooperative, and communicative. Appropriate interactions and affect.. Notes Wound exam; she has 2 open areas on the left leg one of which was her original hematoma site these are about the size of a dime. Debridement of circumferential callus and surface slough she has 2 wounds in this area that of already healed. She has a small wound on the right lateral leg which is weeping edema fluid this just came up within the last week or so Electronic Signature(s) Signed: 11/22/2015 4:27:33 PM By: [name] MD Entered By: [name] on 11/22/2015 10:09:30 [name] ([id number]) -------------------------------------------------------------------------------- Physician Orders Details [name] Date of Service: 11/22/2015 8:45 AM Patient Name: C. Patient Account Number: [id number] Medical Record Treating RN: Baruch Gouty RN, BSN, Velva Harman [id number] Number: Other Clinician: Nov 22, 1934 (81 y.o. Treating Carrie, [place] Date of  Birth/Sex: Female) Physician/Extender: G Primary Care [name] Physician: Referring Physician: [name] in Treatment: 0 Verbal / Phone Orders: Yes Clinician: Afful, RN, BSN, Rita Read Back and Verified: Yes Diagnosis Coding Wound Cleansing Wound #1 Left,Medial Lower Leg o May shower with protection. o No tub bath. Wound #2 Right,Lateral Lower Leg o May shower with protection. o No tub bath. Anesthetic Wound #1 Left,Medial Lower Leg o Topical Lidocaine 4% cream applied to wound bed prior to debridement Wound #  2 Right,Lateral Lower Leg o Topical Lidocaine 4% cream applied to wound bed prior to debridement Skin Barriers/Peri-Wound Care Wound #1 Left,Medial Lower Leg o Barrier cream Wound #2 Right,Lateral Lower Leg o Barrier cream Primary Wound Dressing Wound #1 Left,Medial Lower Leg o Aquacel Ag Wound #2 Right,Lateral Lower Leg o Aquacel Ag Secondary Dressing Wound #1 Left,Medial Lower Leg Morken, Leean C. ([id number]) o ABD pad Wound #2 Right,Lateral Lower Leg o ABD pad Dressing Change Frequency Wound #1 Left,Medial Lower Leg o Change dressing every week Wound #2 Right,Lateral Lower Leg o Change dressing every week Follow-up Appointments Wound #1 Left,Medial Lower Leg o Return Appointment in 1 week. Wound #2 Right,Lateral Lower Leg o Return Appointment in 1 week. Edema Control Wound #1 Left,Medial Lower Leg o 3 Layer Compression System - Bilateral Wound #2 Right,Lateral Lower Leg o 3 Layer Compression System - Bilateral Additional Orders / Instructions Wound #1 Left,Medial Lower Leg o Increase protein intake. o Activity as tolerated Wound #2 Right,Lateral Lower Leg o Increase protein intake. o Activity as tolerated Consults o Vascular - Bilateral Arterial studies Electronic Signature(s) Signed: 11/22/2015 4:27:33 PM By: [name] MD Signed: 11/22/2015 4:54:14 PM By: Regan [name] BSN,  RN Entered By: Regan [name] on 11/22/2015 09:51:33 [place], [name] ([id number]) -------------------------------------------------------------------------------- Problem List Details [name] Date of Service: 11/22/2015 8:45 AM Patient Name: C. Patient Account Number: [id number] Medical Record Treating RN: Baruch Gouty RN, BSN, Velva Harman [id number] Number: Other Clinician: [birth year] (81 y.o. Treating Carrie, [name] Date of Birth/Sex: Female) Physician/Extender: G Primary Care [name] Physician: Referring Physician: [name] in Treatment: 0 Active Problems ICD-10 Encounter Code Description Active Date Diagnosis I87.333 Chronic venous hypertension (idiopathic) with ulcer and 11/22/2015 Yes inflammation of bilateral lower extremity I89.0 Lymphedema, not elsewhere classified 11/22/2015 Yes [id number] Chronic systolic (congestive) heart failure 11/22/2015 Yes Inactive Problems Resolved Problems Electronic Signature(s) Signed: 11/22/2015 4:27:33 PM By: [name] MD Entered By: [name] on 11/22/2015 09:58:21 [place], [name] ([id number]) -------------------------------------------------------------------------------- Progress Note Details [name] Date of Service: 11/22/2015 8:45 AM Patient Name: C. Patient Account Number: [id number] Medical Record Treating RN: Baruch Gouty RN, BSN, Velva Harman [id number] Number: Other Clinician: 1934-11-04 (81 y.o. Treating Carrie, [place] Date of Birth/Sex: Female) Physician/Extender: G Primary Care [name] Physician: Referring Physician: [name] in Treatment: 0 Subjective Chief Complaint Information obtained from Patient Patient is here for review of bilateral lower extremity wounds dating back to December 2016 History of Present Illness (HPI) 11/22/15; this is Digirolamo is a 81 year old woman who lives at home on her own. According to the patient and her daughter was present she has had long-standing  edema in her legs dating back many years. She also has a history of chronic systolic heart failure, atrial fibrillation and is status post mitral valve replacement. Last [facility]ogram I [facility] showed an ejection fraction of 40-45% she is on Lasix 60 mg a day and spironolactone 25 mg a day. Her current problem began in December around Christmas time she developed a small hematoma in the medial part of her left leg which rapidly expanded to a very large hematoma that required surgical debridement. This situation was complicated by the fact that the patient is on long-standing Coumadin for mechanical heart valve. She went to the OR had this evacuated on January 4 17. The wound has gradually improved however she has developed a small wounds around this area and more recently a wound on the right lateral leg. She is weeping edema fluid. The patient is  already been to see vascular surgery. It was recommended that she wear Unna boots, she did not tolerate this due to pain in the left leg. She was then prescribed Juzo stockings and really can't get these on herself although truthfully there is probably too much edema for a Juzo stockings currently. She is not a diabetic and has no history of PAD or claudication that I could elicit. She does not use the external compression pumps reliably. She comes today with notes from her primary physician and Dr. Ronalee Belts both recommending various forms of compression but the patient does not really complied with them. Has been using the external compression pumps with some regularity but certainly not daily on the right leg and this has helped. I also note that her daughter tells me the history that she does not sleep in bed at home. She has a hospital bed but with her legs up she finds this painful so she sleeps in the couch sitting up with her legs dependent. Wound History Patient presents with 2 open wounds that have been present for approximately  since january. Patient has been treating wounds in the following manner: unna boots. The wounds have been healed in the past but have re-opened. Laboratory tests have not been performed in the last month. Patient reportedly has not tested positive for an antibiotic resistant organism. Patient reportedly has not tested positive for osteomyelitis. Patient reportedly has not had testing performed to evaluate circulation in the legs. Patient experiences the following problems associated with their wounds: infection, swelling. CERENITY, HARDWELL ([id number]) Patient History Information obtained from Patient, Caregiver. Allergies PCN Family History No family history of Cancer, Diabetes, Heart Disease, Hereditary Spherocytosis, Hypertension, Kidney Disease, Lung Disease, Seizures, Stroke, Thyroid Problems, Tuberculosis. Social History Never smoker, Marital Status - Single, Alcohol Use - Never, Drug Use - No History, Caffeine Use - Moderate. Medical History Eyes Patient has history of Cataracts Denies history of Glaucoma, Optic Neuritis Ear/Nose/Mouth/Throat Denies history of Chronic sinus problems/congestion, Middle ear problems Hematologic/Lymphatic Denies history of Anemia, Hemophilia, Human Immunodeficiency Virus, Lymphedema, Sickle Cell Disease Respiratory Denies history of Aspiration, Asthma, Chronic Obstructive Pulmonary Disease (COPD), Pneumothorax, Sleep Apnea, Tuberculosis Cardiovascular Patient has history of Arrhythmia, Hypotension Gastrointestinal Denies history of Cirrhosis , Colitis, Crohn s, Hepatitis A, Hepatitis B, Hepatitis C Endocrine Denies history of Type I Diabetes, Type II Diabetes Genitourinary Denies history of End Stage Renal Disease Immunological Denies history of Lupus Erythematosus, Raynaud s, Scleroderma Integumentary (Skin) Denies history of History of Burn, History of pressure wounds Musculoskeletal Patient has history of Osteoarthritis Denies  history of Gout, Rheumatoid Arthritis, Osteomyelitis Neurologic Denies history of Dementia, Neuropathy, Quadriplegia, Paraplegia, Seizure Disorder Oncologic Denies history of Received Chemotherapy, Received Radiation Psychiatric Denies history of Anorexia/bulimia, Confinement Anxiety Review of Systems (ROS) Constitutional Symptoms (General Health) The patient has no complaints or symptoms. Hopple, Nav[place] C. ([id number]) Eyes Complains or has symptoms of Glasses / Contacts. Hematologic/Lymphatic Complains or has symptoms of Bleeding / Clotting Disorders. Respiratory The patient has no complaints or symptoms. Cardiovascular The patient has no complaints or symptoms. Gastrointestinal The patient has no complaints or symptoms. Genitourinary The patient has no complaints or symptoms. Immunological The patient has no complaints or symptoms. Integumentary (Skin) Complains or has symptoms of Wounds, Breakdown, Swelling. Musculoskeletal The patient has no complaints or symptoms. Neurologic The patient has no complaints or symptoms. Oncologic The patient has no complaints or symptoms. Psychiatric The patient has no complaints or symptoms. Objective Constitutional Sitting or standing Blood Pressure is  within target range for patient.Marland Kitchen Heart rate irregular and bradycardic in the 50s. Respirations regular, non-labored and within target range.. Temperature is normal and within the target range for the patient.. Patient's appearance is neat and clean. Appears in no acute distress. Well nourished and well developed.. Vitals Time Taken: 8:52 AM, Height: 68 in, Source: Stated, Weight: 178 lbs, Source: Measured, BMI: 27.1, Temperature: 97.8 F, Pulse: 47 bpm, Respiratory Rate: 17 breaths/min, Blood Pressure: 111/34 mmHg. Eyes Conjunctivae clear. No discharge. No scleral icterus. Respiratory Respiratory effort is easy and symmetric bilaterally. Rate is normal at rest and on room air..  Bilateral breath sounds are clear and equal in all lobes with no wheezes, rales or rhonchi.. Cardiovascular Prowell, Finesse C. ([id number]) 3/6 pansystolic murmur at the lower left sternal border suggest tricuspid regurgitation mechanical S1 no S3. Femoral pulses are palpable but not particularly vibrant I could not feel popliteal pulses. Pedal pulses are strong bilaterally. Capillary refill time seems quite normal. Edema present in both extremities. Left greater than right to. This is nonpitting. Lymphatic None palpable in the popliteal and inguinal area. Psychiatric No evidence of depression, anxiety, or agitation. Calm, cooperative, and communicative. Appropriate interactions and affect.. General Notes: Wound exam; she has 2 open areas on the left leg one of which was her original hematoma site these are about the size of a dime. Debridement of circumferential callus and surface slough she has 2 wounds in this area that of already healed. She has a small wound on the right lateral leg which is weeping edema fluid this just came up within the last week or so Integumentary (Hair, Skin) Venous insufficiency with inflammation and the skin bilaterally in her lower extremities below the knees. Wound #1 status is Open. Original cause of wound was Gradually Appeared. The wound is located on the Left,Medial Lower Leg. The wound measures 9cm length x 6.5cm width x 0.2cm depth; 45.946cm^2 area and 9.189cm^3 volume. The wound is limited to skin breakdown. There is no tunneling or undermining noted. There is a medium amount of serosanguineous drainage noted. The wound margin is distinct with the outline attached to the wound base. There is no granulation within the wound bed. There is no necrotic tissue within the wound bed. The periwound skin appearance exhibited: Moist. The periwound skin appearance did not exhibit: Callus, Crepitus, Excoriation, Fluctuance, Friable, Induration, Localized  Edema, Rash, Scarring, Dry/Scaly, Maceration, Atrophie Blanche, Cyanosis, Ecchymosis, Hemosiderin Staining, Mottled, Pallor, Rubor, Erythema. Periwound temperature was noted as No Abnormality. The periwound has tenderness on palpation. Wound #2 status is Open. Original cause of wound was Gradually Appeared. The wound is located on the Right,Lateral Lower Leg. The wound measures 1.5cm length x 1.2cm width x 0.1cm depth; 1.414cm^2 area and 0.141cm^3 volume. The wound is limited to skin breakdown. There is no tunneling or undermining noted. There is a large amount of serosanguineous drainage noted. The wound margin is distinct with the outline attached to the wound base. There is small (1-33%) pink, pale granulation within the wound bed. There is a medium (34-66%) amount of necrotic tissue within the wound bed including Adherent Slough. The periwound skin appearance exhibited: Localized Edema, Moist. The periwound skin appearance did not exhibit: Callus, Crepitus, Excoriation, Fluctuance, Friable, Induration, Rash, Scarring, Dry/Scaly, Maceration, Atrophie Blanche, Cyanosis, Ecchymosis, Hemosiderin Staining, Mottled, Pallor, Rubor, Erythema. Periwound temperature was noted as No Abnormality. The periwound has tenderness on palpation. Assessment AMONIE, QUEBEDEAUX. ([id number]) Active Problems ICD-10 [phone] - Chronic venous hypertension (idiopathic) with ulcer and inflammation of  bilateral lower extremity I89.0 - Lymphedema, not elsewhere classified [id number] - Chronic systolic (congestive) heart failure Procedures Wound #1 Wound #1 is a Lymphedema located on the Left,Medial Lower Leg . There was a Skin/Subcutaneous Tissue Debridement [id number]) debridement with total area of 58.5 sq cm performed by [name], MD. with the following instrument(s): Curette to remove Non-Viable tissue/material including Fibrin/Slough and Subcutaneous after achieving pain control using Lidocaine 4%  Topical Solution. A time out was not conducted prior to the start of the procedure. A Minimum amount of bleeding was controlled with Pressure. The procedure was tolerated well with a pain level of 0 throughout and a pain level of 0 following the procedure. Post Debridement Measurements: 9cm length x 6.5cm width x 0.2cm depth; 9.189cm^3 volume. Post procedure Diagnosis Wound #1: Same as Pre-Procedure Plan Wound Cleansing: Wound #1 Left,Medial Lower Leg: May shower with protection. No tub bath. Wound #2 Right,Lateral Lower Leg: May shower with protection. No tub bath. Anesthetic: Wound #1 Left,Medial Lower Leg: Topical Lidocaine 4% cream applied to wound bed prior to debridement Wound #2 Right,Lateral Lower Leg: Topical Lidocaine 4% cream applied to wound bed prior to debridement Skin Barriers/Peri-Wound Care: Wound #1 Left,Medial Lower Leg: Barrier cream Wound #2 Right,Lateral Lower Leg: Barrier cream Primary Wound Dressing: AMANTI, KUBICK ([id number]) Wound #1 Left,Medial Lower Leg: Aquacel Ag Wound #2 Right,Lateral Lower Leg: Aquacel Ag Secondary Dressing: Wound #1 Left,Medial Lower Leg: ABD pad Wound #2 Right,Lateral Lower Leg: ABD pad Dressing Change Frequency: Wound #1 Left,Medial Lower Leg: Change dressing every week Wound #2 Right,Lateral Lower Leg: Change dressing every week Follow-up Appointments: Wound #1 Left,Medial Lower Leg: Return Appointment in 1 week. Wound #2 Right,Lateral Lower Leg: Return Appointment in 1 week. Edema Control: Wound #1 Left,Medial Lower Leg: 3 Layer Compression System - Bilateral Wound #2 Right,Lateral Lower Leg: 3 Layer Compression System - Bilateral Additional Orders / Instructions: Wound #1 Left,Medial Lower Leg: Increase protein intake. Activity as tolerated Wound #2 Right,Lateral Lower Leg: Increase protein intake. Activity as tolerated Consults ordered were: Vascular - Bilateral Arterial studies #1 we put her with  silver alginate to help with the edema fluid, ABD, or a Profore light to. I am hopeful that she'll be able to keep these on all week. She can use her external compression pumps for half an hour twice a day on top of this #2 her edema is likely a combination of lymphedema and chronic venous insufficiency with some degree of venous inflammation. Her wounds are not going to heal without edema control and I have told her this today but she is already heard this before. #3 her history of sitting up and keeping her legs dependent because she cannot keep her feet elevated in bed due to pain is interesting and raises the possibility of PAD although her exam at the bedside today shows strong dorsalis pedis pulses, good capillary refill time and warm feet and toes bilaterally. Nevertheless because of this history I have ordered arterial Dopplers #4 he has a history of chronic systolic heart failure and is on Lasix 60 and Aldactone 25. She sees Dr. [name], [name] ([id number]) Sabra Heck her primary physician this afternoon and I suggested perhaps tweaking her diuretic dose a bit as a way of getting some of the edema down in her legs although she tells me that she also has chronic renal insufficiency. #5 we will see if we can keep compression on this patient. We are not going to heal these wounds without it. Dr. Sabra Heck  hopefully can help with the pain issues and her daughter is certainly going to ask him if this is possible Electronic Signature(s) Signed: 11/22/2015 4:27:33 PM By: [name] MD Entered By: [name] on 11/22/2015 10:13:03 [name] ([id number]) -------------------------------------------------------------------------------- ROS/PFSH Details [name] Date of Service: 11/22/2015 8:45 AM Patient Name: C. Patient Account Number: [id number] Medical Record Treating RN: Baruch Gouty RN, BSN, Velva Harman [id number] Number: Other Clinician: June 16, 1935 (81 y.o. Treating  Carrie, [place] Date of Birth/Sex: Female) Physician/Extender: G Primary Care [name] Physician: Referring Physician: [name] in Treatment: 0 Information Obtained From Patient Caregiver Wound History Do you currently have one or more open woundso Yes How many open wounds do you currently haveo 2 Approximately how long have you had your woundso since january How have you been treating your wound(s) until nowo unna boots Has your wound(s) ever healed and then re-openedo Yes Have you had any lab work done in the past montho No Have you tested positive for an antibiotic resistant organism (MRSA, VRE)o No Have you tested positive for osteomyelitis (bone infection)o No Have you had any tests for circulation on your legso No Have you had other problems associated with your woundso Infection, Swelling Eyes Complaints and Symptoms: Positive for: Glasses / Contacts Medical History: Positive for: Cataracts Negative for: Glaucoma; Optic Neuritis Hematologic/Lymphatic Complaints and Symptoms: Positive for: Bleeding / Clotting Disorders Medical History: Negative for: Anemia; Hemophilia; Human Immunodeficiency Virus; Lymphedema; Sickle Cell Disease Integumentary (Skin) Complaints and Symptoms: Positive for: Wounds; Breakdown; Swelling Medical HistoryPHOENICIA, YBARRA ([id number]) Negative for: History of Burn; History of pressure wounds Constitutional Symptoms (General Health) Complaints and Symptoms: No Complaints or Symptoms Ear/Nose/Mouth/Throat Medical History: Negative for: Chronic sinus problems/congestion; Middle ear problems Respiratory Complaints and Symptoms: No Complaints or Symptoms Medical History: Negative for: Aspiration; Asthma; Chronic Obstructive Pulmonary Disease (COPD); Pneumothorax; Sleep Apnea; Tuberculosis Cardiovascular Complaints and Symptoms: No Complaints or Symptoms Medical History: Positive for: Arrhythmia;  Hypotension Gastrointestinal Complaints and Symptoms: No Complaints or Symptoms Medical History: Negative for: Cirrhosis ; Colitis; Crohnos; Hepatitis A; Hepatitis B; Hepatitis C Endocrine Medical History: Negative for: Type I Diabetes; Type II Diabetes Genitourinary Complaints and Symptoms: No Complaints or Symptoms Medical History: Negative for: End Stage Renal Disease Immunological Kloosterman, Shlonda C. ([id number]) Complaints and Symptoms: No Complaints or Symptoms Medical History: Negative for: Lupus Erythematosus; Raynaudos; Scleroderma Musculoskeletal Complaints and Symptoms: No Complaints or Symptoms Medical History: Positive for: Osteoarthritis Negative for: Gout; Rheumatoid Arthritis; Osteomyelitis Neurologic Complaints and Symptoms: No Complaints or Symptoms Medical History: Negative for: Dementia; Neuropathy; Quadriplegia; Paraplegia; Seizure Disorder Oncologic Complaints and Symptoms: No Complaints or Symptoms Medical History: Negative for: Received Chemotherapy; Received Radiation Psychiatric Complaints and Symptoms: No Complaints or Symptoms Medical History: Negative for: Anorexia/bulimia; Confinement Anxiety HBO Extended History Items Eyes: Cataracts Family and Social History Cancer: No; Diabetes: No; Heart Disease: No; Hereditary Spherocytosis: No; Hypertension: No; Kidney Disease: No; Lung Disease: No; Seizures: No; Stroke: No; Thyroid Problems: No; Tuberculosis: No; Never smoker; Marital Status - Single; Alcohol Use: Never; Drug Use: No History; Caffeine Use: Moderate; Financial Concerns: No; Food, Clothing or Shelter Needs: No; Support System Lacking: No; Transportation Concerns: No; Advanced Directives: No; Living Will: No JAMYLAH, NICKLAS ([id number]) Electronic Signature(s) Signed: 11/22/2015 4:27:33 PM By: [name] MD Signed: 11/22/2015 4:54:14 PM By: Regan [name] BSN, RN Entered By: Regan [name] on 11/22/2015 08:59:25 [place]ole,  [place] Loletha Grayer ([id number]) -------------------------------------------------------------------------------- SuperBill Details Patient Name: [name] Date of Service: 11/22/2015 Medical Record Patient Account Number: [id number]  [id number] Number: Treating RN: Baruch Gouty, RN, BSN, Rita 10/01/1934 [phone] y.o. Other Clinician: Date of Birth/Sex: Female) Treating Carrie, [name] Primary Care Physician/Extender: Claudette Laws Physician: Suella [place] in Treatment: 0 Referring Physician: [name] Diagnosis Coding ICD-10 Codes Code Description Chronic venous hypertension (idiopathic) with ulcer and inflammation of bilateral lower I87.333 extremity I89.0 Lymphedema, not elsewhere classified [id number] Chronic systolic (congestive) heart failure Facility Procedures CPT4: Description Modifier Quantity Code [id number] - WOUND CARE VISIT-LEV 3 EST PT 1 CPT4: [id number] - DEB SUBQ TISSUE 20 SQ CM/< 1 ICD-10 Description Diagnosis I87.333 Chronic venous hypertension (idiopathic) with ulcer and inflammation of bilateral lower extremity CPT4: [id number] - DEB SUBQ TISS EA ADDL 20CM 2 ICD-10 Description Diagnosis I87.333 Chronic venous hypertension (idiopathic) with ulcer and inflammation of bilateral lower extremity Physician Procedures CPT4: Description Modifier Quantity Code [id number] - WC PHYS SUBQ TISS 20 SQ CM 1 ICD-10 Description Diagnosis I87.333 Chronic venous hypertension (idiopathic) with ulcer and inflammation of bilateral lower extremity CPT4: [id number] 11045 - WC PHYS SUBQ TISS EA ADDL 20 CM 2 FOYE, LORENCE ([id number]) Electronic Signature(s) Signed: 11/22/2015 4:27:33 PM By: [name] MD Entered By: [name] on 11/22/2015 10:13:52

## 2015-11-23 NOTE — Progress Notes (Signed)
[name], [name] ([id number]) Visit Report for 11/22/2015 Abuse/Suicide Risk Screen Details [name], [name] Date of Service: 11/22/2015 8:45 AM Patient Name: C. Patient Account Number: [id number] Medical Record Treating RN: Baruch Gouty RN, BSN, Velva Harman [id number] Number: Other Clinician: [birth year] (81 y.o. Treating [name], [name] Date of Birth/Sex: Female) Physician/Extender: G Primary Care [name] Physician: Referring Physician: [name] in Treatment: 0 Abuse/Suicide Risk Screen Items Answer ABUSE/SUICIDE RISK SCREEN: Has anyone close to you tried to hurt or harm you recentlyo No Do you feel uncomfortable with anyone in your familyo No Has anyone forced you do things that you didnot want to doo No Do you have any thoughts of harming yourselfo No Patient displays signs or symptoms of abuse and/or neglect. No Electronic Signature(s) Signed: 11/22/2015 4:54:14 PM By: Regan [name] BSN, RN Entered By: Regan [name] on 11/22/2015 08:59:34 [name], [name] ([id number]) -------------------------------------------------------------------------------- Activities of Daily Living Details [name] Date of Service: 11/22/2015 8:45 AM Patient Name: C. Patient Account Number: [id number] Medical Record Treating RN: Baruch Gouty RN, BSN, Velva Harman [id number] Number: Other Clinician: 09-06-1934 (81 y.o. Treating [name], [name] Date of Birth/Sex: Female) Physician/Extender: G [place] Physician: Referring Physician: [name] in Treatment: 0 Activities of Daily Living Items Answer Activities of Daily Living (Please select one for each item) Drive Automobile Not Able Take Medications Completely Able Use Telephone Completely Able Care for Appearance Completely Able Use Toilet Completely Able Bath / Shower Completely Able Dress Self Completely Able Feed Self Completely Able Walk Completely Able Get In / Out Bed Completely Able Housework Completely  Able Prepare Meals Completely [name] for Self Completely Able Electronic Signature(s) Signed: 11/22/2015 4:54:14 PM By: Regan [name] BSN, RN Entered By: Regan [name] on 11/22/2015 09:00:22 [name] ([id number]) -------------------------------------------------------------------------------- Education Assessment Details [name] Date of Service: 11/22/2015 8:45 AM Patient Name: C. Patient Account Number: [id number] Medical Record Treating RN: Baruch Gouty RN, BSN, Velva Harman [id number] Number: Other Clinician: 1934-11-09 (81 y.o. Treating [name], [place] Date of Birth/Sex: Female) Physician/Extender: G Primary Care [name] Physician: Referring Physician: [name] in Treatment: 0 Primary Learner Assessed: Patient Learning Preferences/Education Level/Primary Language Learning Preference: Explanation Highest Education Level: College or Above Preferred Language: English Cognitive Barrier Assessment/Beliefs Language Barrier: No Physical Barrier Assessment Impaired Vision: Yes Glasses Impaired Hearing: No Decreased Hand dexterity: No Knowledge/Comprehension Assessment Knowledge Level: High Comprehension Level: High Ability to understand written High instructions: Ability to understand verbal High instructions: Motivation Assessment Anxiety Level: Calm Cooperation: Cooperative Education Importance: Acknowledges Need Interest in Health Problems: Asks Questions Perception: Coherent Willingness to Engage in Self- High Management Activities: Readiness to Engage in Self- High Management Activities: Electronic Signature(s) Signed: 11/22/2015 4:54:14 PM By: Regan [name] BSN, RN [name], [name] [place] ([id number]) Entered By: Regan [name] on 11/22/2015 08:59:59 [name], [name] ([id number]) -------------------------------------------------------------------------------- Fall Risk Assessment Details [name], [name] Date of  Service: 11/22/2015 8:45 AM Patient Name: C. Patient Account Number: [id number] Medical Record Treating RN: Baruch Gouty RN, BSN, Velva Harman [id number] Number: Other Clinician: 22-Aug-1934 (81 y.o. Treating [name], [name] Date of Birth/Sex: Female) Physician/Extender: G Primary Care [name] Physician: Referring Physician: [name] in Treatment: 0 Fall Risk Assessment Items Have you had 2 or more falls in the last 12 monthso 0 No Have you had any fall that resulted in injury in the last 12 monthso 0 No FALL RISK ASSESSMENT: History of falling - immediate or within 3 months 0 No Secondary diagnosis 0 No Ambulatory aid None/bed rest/wheelchair/nurse 0 Yes Crutches/cane/walker  0 No Furniture 0 No IV Access/Saline Lock 0 No Gait/Training Normal/bed rest/immobile 0 Yes Weak 0 No Impaired 0 No Mental Status Oriented to own ability 0 Yes Electronic Signature(s) Signed: 11/22/2015 4:54:14 PM By: Regan [name] BSN, RN Entered By: Regan [name] on 11/22/2015 09:00:34 [name], [name] ([id number]) -------------------------------------------------------------------------------- Foot Assessment Details [name] Date of Service: 11/22/2015 8:45 AM Patient Name: C. Patient Account Number: [id number] Medical Record Treating RN: Baruch Gouty RN, BSN, Velva Harman [id number] Number: Other Clinician: 1935-05-12 (81 y.o. Treating [name], [place] Date of Birth/Sex: Female) Physician/Extender: G Primary Care [name] Physician: Referring Physician: [name] in Treatment: 0 Foot Assessment Items Site Locations + = Sensation present, - = Sensation absent, C = Callus, U = Ulcer R = Redness, W = Warmth, M = Maceration, PU = Pre-ulcerative lesion F = Fissure, S = Swelling, D = Dryness Assessment Right: Left: Other Deformity: No No Prior Foot Ulcer: No No Prior Amputation: No No Charcot Joint: No No Ambulatory Status: Ambulatory Without Help Gait: Steady Electronic  Signature(s) Signed: 11/22/2015 4:54:14 PM By: Regan [name] BSN, RN Entered By: Regan [name] on 11/22/2015 09:00:59 [name], [name] ([id number]) 20, [name] ([id number]) -------------------------------------------------------------------------------- Nutrition Risk Assessment Details [name], [name] Date of Service: 11/22/2015 8:45 AM Patient Name: C. Patient Account Number: [id number] Medical Record Treating RN: Baruch Gouty RN, BSN, Velva Harman [id number] Number: Other Clinician: [birth year] (81 y.o. Treating [name], [place] Date of Birth/Sex: Female) Physician/Extender: G Primary Care [name] Physician: Referring Physician: [name] in Treatment: 0 Height (in): 68 Weight (lbs): 178 Body Mass Index (BMI): 27.1 Nutrition Risk Assessment Items NUTRITION RISK SCREEN: I have an illness or condition that made me change the kind and/or 0 No amount of food I eat I eat fewer than two meals per day 0 No I eat few fruits and vegetables, or milk products 0 No I have three or more drinks of beer, liquor or wine almost every day 0 No I have tooth or mouth problems that make it hard for me to eat 0 No I don't always have enough money to buy the food I need 0 No I eat alone most of the time 0 No I take three or more different prescribed or over-the-counter drugs a 0 No day Without wanting to, I have lost or gained 10 pounds in the last six 0 No months I am not always physically able to shop, cook and/or feed myself 0 No Nutrition Protocols Good Risk Protocol 0 No interventions needed Moderate Risk Protocol Electronic Signature(s) Signed: 11/22/2015 4:54:14 PM By: Regan [name] BSN, RN Entered By: Regan [name] on 11/22/2015 09:00:50

## 2015-11-23 NOTE — Progress Notes (Addendum)
[name] ([id number]) Visit Report for 11/22/2015 Allergy List Details Patient Name: [name], [name]. Date of Service: 11/22/2015 8:45 AM Medical Record Patient Account Number: [id number] Number: Treating RN: Carrie Gouty, RN, BSN, Rita [birth year] [phone] y.o. Other Clinician: Date of Birth/Sex: Female) Treating [name] Primary Care Physician/Extender: Claudette Laws Physician: Referring Physician: [name] in Treatment: 0 Allergies Active Allergies PCN Allergy Notes Electronic Signature(s) Signed: 11/22/2015 4:54:14 PM By: Regan [name] BSN, RN Entered By: Regan [name] on 11/22/2015 08:55:16 [name], [name] ([id number]) -------------------------------------------------------------------------------- Arrival Information Details Patient Name: [name]. Date of Service: 11/22/2015 8:45 AM Medical Record Patient Account Number: [id number] Number: Treating RN: Carrie Gouty, RN, BSN, Rita [birth year] [phone] y.o. Other Clinician: Date of Birth/Sex: Female) Treating [name] Primary Care Physician/Extender: Claudette Laws Physician: Referring Physician: [name] in Treatment: 0 Visit Information Patient Arrived: Ambulatory Arrival Time: 08:51 Accompanied By: dtr Transfer Assistance: None Patient Identification Verified: Yes Secondary Verification Process Yes Completed: Patient Requires Transmission- No Based Precautions: Patient Has Alerts: Yes Patient Alerts: Patient on Blood Thinner [place] Signature(s) Signed: 11/22/2015 4:54:14 PM By: Regan [name] BSN, RN Entered By: Regan [name] on 11/22/2015 09:34:47 [name], [name] ([id number]) -------------------------------------------------------------------------------- Clinic Level of Care Assessment Details Patient Name: [name]. Date of Service: 11/22/2015 8:45 AM Medical Record Patient Account Number:  [id number] Number: Treating RN: Carrie Gouty, RN, BSN, Rita February 03, 1935 [phone] y.o. Other Clinician: Date of Birth/Sex: Female) Treating [name] Primary Care Physician/Extender: Claudette Laws Physician: Referring Physician: [name] in Treatment: 0 Clinic Level of Care Assessment Items TOOL 1 Quantity Score []  - Use when EandM and Procedure is performed on INITIAL visit 0 ASSESSMENTS - Nursing Assessment / Reassessment X - General Physical Exam (combine w/ comprehensive assessment (listed just 1 20 below) when performed on new pt. evals) X - Comprehensive Assessment (HX, ROS, Risk Assessments, Wounds Hx, etc.) 1 25 ASSESSMENTS - Wound and Skin Assessment / Reassessment []  - Dermatologic / Skin Assessment (not related to wound area) 0 ASSESSMENTS - Ostomy and/or Continence Assessment and Care []  - Incontinence Assessment and Management 0 []  - Ostomy Care Assessment and Management (repouching, etc.) 0 PROCESS - Coordination of Care X - Simple Patient / Family Education for ongoing care 1 15 []  - Complex (extensive) Patient / Family Education for ongoing care 0 X - Staff obtains Programmer, systems, Records, Test Results / Process Orders 1 10 []  - Staff telephones HHA, Nursing Homes / Clarify orders / etc 0 []  - Routine Transfer to another Facility (non-emergent condition) 0 []  - Routine Hospital Admission (non-emergent condition) 0 X - New Admissions / Biomedical engineer / Ordering NPWT, Apligraf, etc. 1 15 []  - Emergency Hospital Admission (emergent condition) 0 PROCESS - Special Needs []  - Pediatric / Minor Patient Management 0 [name], [name]. ([id number]) []  - Isolation Patient Management 0 []  - Hearing / Language / Visual special needs 0 []  - Assessment of Community assistance (transportation, D/[name] planning, etc.) 0 []  - Additional assistance / Altered mentation 0 []  - Support Surface(s) Assessment (bed, cushion, seat, etc.) 0 INTERVENTIONS -  Miscellaneous []  - External ear exam 0 []  - Patient Transfer (multiple staff / Civil Service fast streamer / Similar devices) 0 []  - Simple Staple / Suture removal (25 or less) 0 []  - Complex Staple / Suture removal (26 or more) 0 []  - Hypo/Hyperglycemic Management (do not check if billed separately) 0 []  - Ankle / Brachial Index (ABI) - do not check  if billed separately 0 Has the patient been seen at the hospital within the last three years: Yes Total Score: 85 Level Of Care: New/Established - Level 3 Electronic Signature(s) Signed: 11/22/2015 4:54:14 PM By: Regan [name] BSN, RN Entered By: Regan [name] on 11/22/2015 09:45:42 [name], [name] ([id number]) -------------------------------------------------------------------------------- Encounter Discharge Information Details Patient Name: Carrie Mulligan [name]. Date of Service: 11/22/2015 8:45 AM Medical Record Patient Account Number: [id number] Number: Treating RN: Carrie Gouty, RN, BSN, Rita September 09, 1934 [phone] y.o. Other Clinician: Date of Birth/Sex: Female) Treating [name] Primary Care Physician/Extender: Claudette Laws Physician: Referring Physician: [name] in Treatment: 0 Encounter Discharge Information Items Discharge Pain Level: 0 Discharge Condition: Stable Ambulatory Status: Ambulatory Discharge Destination: Home Transportation: Private Auto Accompanied By: dtr Schedule Follow-up Appointment: No Medication Reconciliation completed and provided to Patient/Care No Rever Pichette: Provided on Clinical Summary of Care: 11/22/2015 Form Type Recipient Paper Patient [place] Electronic Signature(s) Signed: 11/22/2015 10:13:12 AM By: Ruthine Dose Entered By: Ruthine Dose on 11/22/2015 10:13:12 [name], [name] ([id number]) -------------------------------------------------------------------------------- Lower Extremity Assessment Details Patient Name: [name], [name]. Date of Service: 11/22/2015 8:45 AM Medical Record  Patient Account Number: [id number] Number: Treating RN: Carrie Gouty, RN, BSN, Rita October 05, 1934 [phone] y.o. Other Clinician: Date of Birth/Sex: Female) Treating [name] Primary Care Physician/Extender: Claudette Laws Physician: Referring Physician: [name] in Treatment: 0 Edema Assessment Assessed: [Left: No] [Right: No] E[Left: dema] [Right: :] Calf Left: Right: Point of Measurement: 40 cm From Medial Instep 48 cm 45 cm Ankle Left: Right: Point of Measurement: 10 cm From Medial Instep 31 cm 29 cm Vascular Assessment Claudication: Claudication Assessment [Left:None] [Right:None] Pulses: Posterior Tibial Doppler: [Left:Monophasic] [Right:Monophasic] Dorsalis Pedis Palpable: [Left:Yes] [Right:Yes] Doppler: [Left:Monophasic] [Right:Monophasic] Extremity colors, hair growth, and conditions: Extremity Color: [Left:Mottled] [Right:Mottled] Hair Growth on Extremity: [Left:Yes] [Right:Yes] Temperature of Extremity: [Left:Warm] [Right:Warm] Capillary Refill: [Left:< 3 seconds] [Right:< 3 seconds] Toe Nail Assessment Left: Right: Thick: Yes Yes Discolored: No No Deformed: No No Improper Length and Hygiene: No No Delaughter, [name]. ([id number]) Notes ABI not obtainable due to non compressible. Electronic Signature(s) Signed: 01/11/2016 3:00:06 PM By: Regan [name] BSN, RN Previous Signature: 11/22/2015 4:54:14 PM Version By: Regan [name] BSN, RN Entered By: Regan [name] on 01/11/2016 15:00:05 [name] ([id number]) -------------------------------------------------------------------------------- Multi Wound Chart Details Patient Name: Carrie Mulligan [name]. Date of Service: 11/22/2015 8:45 AM Medical Record Patient Account Number: [id number] Number: Treating RN: Carrie Gouty, RN, BSN, Rita 06/10/1935 [phone] y.o. Other Clinician: Date of Birth/Sex: Female) Treating [name] Primary Care Physician/Extender: Claudette Laws Physician: Referring  Physician: [name] in Treatment: 0 Vital Signs Height(in): 68 Pulse(bpm): 47 Weight(lbs): 178 Blood Pressure 111/34 (mmHg): Body Mass Index(BMI): 27 Temperature(F): 97.8 Respiratory Rate 17 (breaths/min): Photos: [1:No Photos] [2:No Photos] [N/A:N/A] Wound Location: [1:Left Lower Leg - Medial] [2:Right Lower Leg - Lateral] [N/A:N/A] Wounding Event: [1:Gradually Appeared] [2:Gradually Appeared] [N/A:N/A] Primary Etiology: [1:Lymphedema] [2:Lymphedema] [N/A:N/A] Comorbid History: [1:Cataracts, Arrhythmia, Hypotension, Osteoarthritis] [2:Cataracts, Arrhythmia, Hypotension, Osteoarthritis] [N/A:N/A] Date Acquired: [1:07/04/2015] [2:07/04/2015] [N/A:N/A] Weeks of Treatment: [1:0] [2:0] [N/A:N/A] Wound Status: [1:Open] [2:Open] [N/A:N/A] Measurements L x W x D 9x6.5x0.2 [2:1.5x1.2x0.1] [N/A:N/A] (cm) Area (cm) : [1:45.946] [2:1.414] [N/A:N/A] Volume (cm) : [1:9.189] [2:0.141] [N/A:N/A] % Reduction in Area: [1:0.00%] [2:0.00%] [N/A:N/A] % Reduction in Volume: 0.00% [2:0.00%] [N/A:N/A] Classification: [1:Partial Thickness] [2:Partial Thickness] [N/A:N/A] Exudate Amount: [1:Medium] [2:Large] [N/A:N/A] Exudate Type: [1:Serosanguineous] [2:Serosanguineous] [N/A:N/A] Exudate Color: [1:red, brown] [2:red, brown] [N/A:N/A] Wound Margin: [1:Distinct, outline attached] [2:Distinct, outline attached] [N/A:N/A] Granulation Amount: [1:None Present (  0%)] [2:Small (1-33%)] [N/A:N/A] Granulation Quality: [1:N/A] [2:Pink, Pale] [N/A:N/A] Necrotic Amount: [1:None Present (0%)] [2:Medium (34-66%)] [N/A:N/A] Exposed Structures: [1:Fascia: No Fat: No Tendon: No] [2:Fascia: No Fat: No Tendon: No] [N/A:N/A] Muscle: No Muscle: No Joint: No Joint: No Bone: No Bone: No Limited to Skin Limited to Skin Breakdown Breakdown Epithelialization: None None N/A Periwound Skin Texture: Edema: No Edema: Yes N/A Excoriation: No Excoriation: No Induration: No Induration: No Callus: No Callus:  No Crepitus: No Crepitus: No Fluctuance: No Fluctuance: No Friable: No Friable: No Rash: No Rash: No Scarring: No Scarring: No Periwound Skin Moist: Yes Moist: Yes N/A Moisture: Maceration: No Maceration: No Dry/Scaly: No Dry/Scaly: No Periwound Skin Color: Atrophie Blanche: No Atrophie Blanche: No N/A Cyanosis: No Cyanosis: No Ecchymosis: No Ecchymosis: No Erythema: No Erythema: No Hemosiderin Staining: No Hemosiderin Staining: No Mottled: No Mottled: No Pallor: No Pallor: No Rubor: No Rubor: No Temperature: No Abnormality No Abnormality N/A Tenderness on Yes Yes N/A Palpation: Wound Preparation: Ulcer Cleansing: Ulcer Cleansing: N/A Rinsed/Irrigated with Rinsed/Irrigated with Saline Saline Topical Anesthetic Topical Anesthetic Applied: Other: lidocaine Applied: Other: lidocaine 4% 4% Treatment Notes Electronic Signature(s) Signed: 11/22/2015 4:54:14 PM By: Regan [name] BSN, RN Entered By: Regan [name] on 11/22/2015 09:37:22 [name], [name] ([id number]) -------------------------------------------------------------------------------- [place] Details Patient Name: [name], [name]. Date of Service: 11/22/2015 8:45 AM Medical Record Patient Account Number: [id number] Number: Treating RN: Carrie Gouty, RN, BSN, Rita 07-10-1934 [phone] y.o. Other Clinician: Date of Birth/Sex: Female) Treating [name] Primary Care Physician/Extender: Claudette Laws Physician: Referring Physician: [name] in Treatment: 0 Active Inactive Orientation to the Wound Care Program Nursing Diagnoses: Knowledge deficit related to the wound healing center program Goals: Patient/caregiver will verbalize understanding of the [facility] Date Initiated: 11/22/2015 Goal Status: Active Interventions: Provide education on orientation to the wound center Notes: Venous Leg Ulcer Nursing Diagnoses: Knowledge deficit related  to disease process and management Potential for venous Insuffiency (use before diagnosis confirmed) Goals: Patient will maintain optimal edema control Date Initiated: 11/22/2015 Goal Status: Active Patient/caregiver will verbalize understanding of disease process and disease management Date Initiated: 11/22/2015 Goal Status: Active Verify adequate tissue perfusion prior to therapeutic compression application Date Initiated: 11/22/2015 Goal Status: Active Interventions: Assess peripheral edema status every visit. [name], [name] ([id number]) Compression as ordered Provide education on venous insufficiency Treatment Activities: Non-invasive vascular studies : 11/22/2015 Therapeutic compression applied : 11/22/2015 Notes: Wound/Skin Impairment Nursing Diagnoses: Impaired tissue integrity Knowledge deficit related to ulceration/compromised skin integrity Goals: Patient/caregiver will verbalize understanding of skin care regimen Date Initiated: 11/22/2015 Goal Status: Active Ulcer/skin breakdown will have a volume reduction of 30% by week 4 Date Initiated: 11/22/2015 Goal Status: Active Ulcer/skin breakdown will have a volume reduction of 50% by week 8 Date Initiated: 11/22/2015 Goal Status: Active Ulcer/skin breakdown will have a volume reduction of 80% by week 12 Date Initiated: 11/22/2015 Goal Status: Active Ulcer/skin breakdown will heal within 14 weeks Date Initiated: 11/22/2015 Goal Status: Active Interventions: Assess patient/caregiver ability to perform ulcer/skin care regimen upon admission and as needed Assess ulceration(s) every visit Provide education on ulcer and skin care Treatment Activities: Skin care regimen initiated : 11/22/2015 Topical wound management initiated : 11/22/2015 Notes: Electronic Signature(s) Signed: 11/22/2015 4:54:14 PM By: Regan [name] BSN, RN Mato, [name] Kitchen ([id number]) Entered By: Regan [name] on 11/22/2015 09:32:26 Sauser, [name]  ([id number]) -------------------------------------------------------------------------------- Pain Assessment Details Patient Name: Carrie Mulligan [name]. Date of Service: 11/22/2015 8:45 AM Medical Record Patient Account Number: [id number]  [id number] Number: Treating RN: Carrie Gouty, RN, BSN, Rita 11/15/1934 [phone] y.o. Other Clinician: Date of Birth/Sex: Female) Treating [name] Primary Care Physician/Extender: Claudette Laws Physician: Referring Physician: [name] in Treatment: 0 Active Problems Location of Pain Severity and Description of Pain Patient Has Paino No Site Locations Pain Management and Medication Current Pain Management: Electronic Signature(s) Signed: 11/22/2015 4:54:14 PM By: Regan [name] BSN, RN Entered By: Regan [name] on 11/22/2015 08:52:25 Rojero, [name] ([id number]) -------------------------------------------------------------------------------- Patient/Caregiver Education Details Patient Name: [name]. Date of Service: 11/22/2015 8:45 AM Medical Record Patient Account Number: [id number] Number: Treating RN: Carrie Gouty, RN, BSN, Rita [birth year] [phone] y.o. Other Clinician: Date of Birth/Gender: Female) Treating [name] Primary Care Physician/Extender: Claudette Laws Physician: Suella Grove in Treatment: 0 Referring Physician: [name] Education Assessment Education Provided To: Patient and Caregiver Education Topics Provided Basic Hygiene: Methods: Explain/Verbal Responses: State content correctly Venous: Methods: Explain/Verbal Responses: State content correctly Welcome To [facility]: Methods: Explain/Verbal Responses: State content correctly Wound/Skin Impairment: Methods: Explain/Verbal Responses: State content correctly Electronic Signature(s) Signed: 11/22/2015 4:54:14 PM By: Regan [name] BSN, RN Entered By: Regan [name] on 11/22/2015 09:46:25 Uresti, Jessicaann Loletha Grayer  ([id number]) -------------------------------------------------------------------------------- Wound Assessment Details Patient Name: [name], [name]. Date of Service: 11/22/2015 8:45 AM Medical Record Patient Account Number: [id number] Number: Treating RN: Carrie Gouty, RN, BSN, Rita May 07, 1935 [phone] y.o. Other Clinician: Date of Birth/Sex: Female) Treating [name] Primary Care Physician/Extender: Claudette Laws Physician: Referring Physician: [name] in Treatment: 0 Wound Status Wound Number: 1 Primary Lymphedema Etiology: Wound Location: Left Lower Leg - Medial Wound Status: Open Wounding Event: Gradually Appeared Comorbid Cataracts, Arrhythmia, Hypotension, Date Acquired: 07/04/2015 History: Osteoarthritis Weeks Of Treatment: 0 Clustered Wound: No Photos Photo Uploaded By: Regan [name] on 11/22/2015 16:53:32 Wound Measurements Length: (cm) 9 Width: (cm) 6.5 Depth: (cm) 0.2 Area: (cm) 45.946 Volume: (cm) 9.189 % Reduction in Area: 0% % Reduction in Volume: 0% Epithelialization: None Tunneling: No Undermining: No Wound Description Classification: Partial Thickness Wound Margin: Distinct, outline attached Exudate Amount: Medium Exudate Type: Serosanguineous Exudate Color: red, brown Foul Odor After Cleansing: No Wound Bed Granulation Amount: None Present (0%) Exposed Structure Necrotic Amount: None Present (0%) Fascia Exposed: No Adell, Latria [name]. ([id number]) Fat Layer Exposed: No Tendon Exposed: No Muscle Exposed: No Joint Exposed: No Bone Exposed: No Limited to Skin Breakdown Periwound Skin Texture Texture Color No Abnormalities Noted: No No Abnormalities Noted: No Callus: No Atrophie Blanche: No Crepitus: No Cyanosis: No Excoriation: No Ecchymosis: No Fluctuance: No Erythema: No Friable: No Hemosiderin Staining: No Induration: No Mottled: No Localized Edema: No Pallor: No Rash: No Rubor: No Scarring: No  Temperature / Pain Moisture Temperature: No Abnormality No Abnormalities Noted: No Tenderness on Palpation: Yes Dry / Scaly: No Maceration: No Moist: Yes Wound Preparation Ulcer Cleansing: Rinsed/Irrigated with Saline Topical Anesthetic Applied: Other: lidocaine 4%, Electronic Signature(s) Signed: 11/22/2015 4:54:14 PM By: Regan [name] BSN, RN Entered By: Regan [name] on 11/22/2015 09:34:18 [place], [name] ([id number]) -------------------------------------------------------------------------------- Wound Assessment Details Patient Name: Carrie Mulligan [name]. Date of Service: 11/22/2015 8:45 AM Medical Record Patient Account Number: [id number] Number: Treating RN: Carrie Gouty, RN, BSN, Rita February 16, 1935 [phone] y.o. Other Clinician: Date of Birth/Sex: Female) Treating [name] Primary Care Physician/Extender: Claudette Laws Physician: Referring Physician: [name] in Treatment: 0 Wound Status Wound Number: 2 Primary Lymphedema Etiology: Wound Location: Right Lower Leg - Lateral Wound Status: Open Wounding Event: Gradually Appeared Comorbid Cataracts, Arrhythmia, Hypotension, Date Acquired:  07/04/2015 History: Osteoarthritis Weeks Of Treatment: 0 Clustered Wound: No Photos Photo Uploaded By: Regan [name] on 11/22/2015 16:53:43 Wound Measurements Length: (cm) 1.5 Width: (cm) 1.2 Depth: (cm) 0.1 Area: (cm) 1.414 Volume: (cm) 0.141 % Reduction in Area: 0% % Reduction in Volume: 0% Epithelialization: None Tunneling: No Undermining: No Wound Description Classification: Partial Thickness Wound Margin: Distinct, outline attached Exudate Amount: Large Exudate Type: Serosanguineous Exudate Color: red, brown Foul Odor After Cleansing: No Wound Bed Granulation Amount: Small (1-33%) Exposed Structure Granulation Quality: Pink, Pale Fascia Exposed: No Rembert, Ahlam [name]. ([id number]) Necrotic Amount: Medium (34-66%) Fat Layer Exposed: No Necrotic  Quality: Adherent Slough Tendon Exposed: No Muscle Exposed: No Joint Exposed: No Bone Exposed: No Limited to Skin Breakdown Periwound Skin Texture Texture Color No Abnormalities Noted: No No Abnormalities Noted: No Callus: No Atrophie Blanche: No Crepitus: No Cyanosis: No Excoriation: No Ecchymosis: No Fluctuance: No Erythema: No Friable: No Hemosiderin Staining: No Induration: No Mottled: No Localized Edema: Yes Pallor: No Rash: No Rubor: No Scarring: No Temperature / Pain Moisture Temperature: No Abnormality No Abnormalities Noted: No Tenderness on Palpation: Yes Dry / Scaly: No Maceration: No Moist: Yes Wound Preparation Ulcer Cleansing: Rinsed/Irrigated with Saline Topical Anesthetic Applied: Other: lidocaine 4%, Electronic Signature(s) Signed: 11/22/2015 4:54:14 PM By: Regan [name] BSN, RN Entered By: Regan [name] on 11/22/2015 09:34:02 [place], [name] ([id number]) -------------------------------------------------------------------------------- Vitals Details Patient Name: Carrie Mulligan [name]. Date of Service: 11/22/2015 8:45 AM Medical Record Patient Account Number: [id number] Number: Treating RN: Carrie Gouty, RN, BSN, Rita Sep 04, 1934 [phone] y.o. Other Clinician: Date of Birth/Sex: Female) Treating [name] Primary Care Physician/Extender: Claudette Laws Physician: Referring Physician: [name] in Treatment: 0 Vital Signs Time Taken: 08:52 Temperature (F): 97.8 Height (in): 68 Pulse (bpm): 47 Source: Stated Respiratory Rate (breaths/min): 17 Weight (lbs): 178 Blood Pressure (mmHg): 111/34 Source: Measured Reference Range: 80 - 120 mg / dl Body Mass Index (BMI): 27.1 Electronic Signature(s) Signed: 11/22/2015 4:54:14 PM By: Regan [name] BSN, RN Entered By: Regan [name] on 11/22/2015 08:55:34

## 2015-11-29 ENCOUNTER — Encounter: Payer: Medicare Other | Admitting: Internal Medicine

## 2015-11-29 DIAGNOSIS — I5042 Chronic combined systolic (congestive) and diastolic (congestive) heart failure: Secondary | ICD-10-CM | POA: Diagnosis not present

## 2015-11-30 NOTE — Progress Notes (Signed)
Carrie, [name] ([id number]) Visit Report for 11/29/2015 Arrival Information Details Patient Name: Carrie, [name]. Date of Service: 11/29/2015 12:45 PM Medical Record Patient Account Number: [id number] Number: Treating RN: [name] December 31, 1934 (81 y.o. Other Clinician: Date of Birth/Sex: Female) Treating Carrie, [name] Primary Care Physician/Extender: Claudette Laws Physician: Referring Physician: [name] in Treatment: 1 Visit Information History Since Last Visit All ordered tests and consults were completed: No Patient Arrived: Ambulatory Added or deleted any medications: No Arrival Time: 12:51 Any new allergies or adverse reactions: No Accompanied By: son Had a fall or experienced change in No Transfer Assistance: None activities of daily living that may affect Patient Identification Verified: Yes risk of falls: Secondary Verification Yes Signs or symptoms of abuse/neglect since last No Process Completed: visito Patient Requires No Hospitalized since last visit: No Transmission-Based Pain Present Now: No Precautions: Patient Has Alerts: Yes Patient Alerts: Patient on Blood Thinner [place] Signature(s) Signed: 11/29/2015 5:48:23 PM By: [name] Entered By: [name] on 11/29/2015 12:52:53 Witting, [name] ([id number]) -------------------------------------------------------------------------------- Encounter Discharge Information Details Patient Name: Carrie Mulligan C. Date of Service: 11/29/2015 12:45 PM Medical Record Patient Account Number: [id number] Number: Treating RN: [name] [birth year] (81 y.o. Other Clinician: Date of Birth/Sex: Female) Treating Carrie, [name] Primary Care Physician/Extender: Claudette Laws Physician: Referring Physician: [name] in Treatment: 1 Encounter Discharge Information Items Discharge Pain Level: 0 Discharge  Condition: Stable Ambulatory Status: Ambulatory Discharge Destination: Home Transportation: Private Auto Accompanied By: son Schedule Follow-up Appointment: Yes Medication Reconciliation completed and provided to Patient/Care Yes [name]: Provided on Clinical Summary of Care: 11/29/2015 Form Type Recipient Paper Patient [place] Electronic Signature(s) Signed: 11/29/2015 2:04:15 PM By: Ruthine Dose Entered By: Ruthine Dose on 11/29/2015 14:04:15 [name], [name] ([id number]) -------------------------------------------------------------------------------- Lower Extremity Assessment Details Patient Name: [name], [name] Date of Service: 11/29/2015 12:45 PM Medical Record Patient Account Number: [id number] Number: Treating RN: [name] 12-[name]-1935 (81 y.o. Other Clinician: Date of Birth/Sex: Female) Treating Carrie, [name] Primary Care Physician/Extender: Claudette Laws Physician: Referring Physician: [name] in Treatment: 1 Edema Assessment Assessed: [Left: No] [Right: No] E[Left: dema] [Right: :] Calf Left: Right: Point of Measurement: 40 cm From Medial Instep 41.5 cm 39.5 cm Ankle Left: Right: Point of Measurement: 10 cm From Medial Instep 26.5 cm 26 cm Vascular Assessment Pulses: Posterior Tibial Dorsalis Pedis Palpable: [Left:Yes] [Right:Yes] Extremity colors, hair growth, and conditions: Extremity Color: [Left:Mottled] [Right:Mottled] Temperature of Extremity: [Left:Warm] [Right:Warm] Capillary Refill: [Left:< 3 seconds] [Right:< 3 seconds] Toe Nail Assessment Left: Right: Thick: Yes Yes Discolored: No No Deformed: No No Improper Length and Hygiene: No No Electronic Signature(s) Signed: 11/29/2015 5:48:23 PM By: [name] Entered By: [name] on 11/29/2015 13:08:30 Leser, [name] ([id number]) [place], [place].  ([id number]) -------------------------------------------------------------------------------- Multi Wound Chart Details Patient Name: [name], [name] Date of Service: 11/29/2015 12:45 PM Medical Record Patient Account Number: [id number] Number: Treating RN: [name] April 15, 1935 (81 y.o. Other Clinician: Date of Birth/Sex: Female) Treating Carrie, [name] Primary Care Physician/Extender: Claudette Laws Physician: Referring Physician: [name] in Treatment: 1 Vital Signs Height(in): 68 Pulse(bpm): 48 Weight(lbs): 178 Blood Pressure 101/34 (mmHg): Body Mass Index(BMI): 27 Temperature(F): 97.7 Respiratory Rate 18 (breaths/min): Photos: [1:No Photos] [2:No Photos] [N/A:N/A] Wound Location: [1:Left Lower Leg - Medial] [2:Right Lower Leg - Lateral] [N/A:N/A] Wounding Event: [1:Gradually Appeared] [2:Gradually Appeared] [N/A:N/A] Primary Etiology: [1:Lymphedema] [2:Lymphedema] [N/A:N/A] Comorbid History: [1:Cataracts, Arrhythmia, Hypotension, Osteoarthritis] [2:Cataracts, Arrhythmia, Hypotension, Osteoarthritis] [N/A:N/A]  Date Acquired: [1:07/04/2015] [2:07/04/2015] [N/A:N/A] Weeks of Treatment: [1:1] [2:1] [N/A:N/A] Wound Status: [1:Open] [2:Open] [N/A:N/A] Measurements L x W x D 8x6.5x0.2 [2:1.6x1.2x0.2] [N/A:N/A] (cm) Area (cm) : [1:40.841] [2:1.508] [N/A:N/A] Volume (cm) : [1:8.168] [2:0.302] [N/A:N/A] % Reduction in Area: [1:11.10%] [2:-6.60%] [N/A:N/A] % Reduction in Volume: 11.10% [2:-114.20%] [N/A:N/A] Classification: [1:Partial Thickness] [2:Partial Thickness] [N/A:N/A] Exudate Amount: [1:Large] [2:Large] [N/A:N/A] Exudate Type: [1:Serosanguineous] [2:Serosanguineous] [N/A:N/A] Exudate Color: [1:red, brown] [2:red, brown] [N/A:N/A] Wound Margin: [1:Distinct, outline attached] [2:Distinct, outline attached] [N/A:N/A] Granulation Amount: [1:Medium (34-66%)] [2:Large (67-100%)] [N/A:N/A] Granulation Quality: [1:Red] [2:Pink, Pale]  [N/A:N/A] Necrotic Amount: [1:Medium (34-66%)] [2:Small (1-33%)] [N/A:N/A] Exposed Structures: [1:Fascia: No Fat: No Tendon: No] [2:Fascia: No Fat: No Tendon: No] [N/A:N/A] Muscle: No Muscle: No Joint: No Joint: No Bone: No Bone: No Limited to Skin Limited to Skin Breakdown Breakdown Epithelialization: None None N/A Periwound Skin Texture: Edema: No Edema: Yes N/A Excoriation: No Excoriation: No Induration: No Induration: No Callus: No Callus: No Crepitus: No Crepitus: No Fluctuance: No Fluctuance: No Friable: No Friable: No Rash: No Rash: No Scarring: No Scarring: No Periwound Skin Moist: Yes Moist: Yes N/A Moisture: Maceration: No Maceration: No Dry/Scaly: No Dry/Scaly: No Periwound Skin Color: Atrophie Blanche: No Atrophie Blanche: No N/A Cyanosis: No Cyanosis: No Ecchymosis: No Ecchymosis: No Erythema: No Erythema: No Hemosiderin Staining: No Hemosiderin Staining: No Mottled: No Mottled: No Pallor: No Pallor: No Rubor: No Rubor: No Temperature: No Abnormality No Abnormality N/A Tenderness on Yes Yes N/A Palpation: Wound Preparation: Ulcer Cleansing: Ulcer Cleansing: N/A Rinsed/Irrigated with Rinsed/Irrigated with Saline Saline Topical Anesthetic Topical Anesthetic Applied: Other: lidocaine Applied: Other: lidocaine 4% 4% Treatment Notes Electronic Signature(s) Signed: 11/29/2015 5:48:23 PM By: [name] Entered By: [name] on 11/29/2015 13:20:24 [name] ([id number]) -------------------------------------------------------------------------------- Multi-Disciplinary Care Plan Details Patient Name: [name]. Date of Service: 11/29/2015 12:45 PM Medical Record Patient Account Number: [id number] Number: Treating RN: [name] 1935-06-18 (80 y.o. Other Clinician: Date of Birth/Sex: Female) Treating Carrie, [name] Primary Care Physician/Extender: Claudette Laws Physician: Referring  Physician: [name] in Treatment: 1 Active Inactive Orientation to the Wound Care Program Nursing Diagnoses: Knowledge deficit related to the wound healing center program Goals: Patient/caregiver will verbalize understanding of the [facility] Program Date Initiated: 11/22/2015 Goal Status: Active Interventions: Provide education on orientation to the wound center Notes: Venous Leg Ulcer Nursing Diagnoses: Knowledge deficit related to disease process and management Potential for venous Insuffiency (use before diagnosis confirmed) Goals: Patient will maintain optimal edema control Date Initiated: 11/22/2015 Goal Status: Active Patient/caregiver will verbalize understanding of disease process and disease management Date Initiated: 11/22/2015 Goal Status: Active Verify adequate tissue perfusion prior to therapeutic compression application Date Initiated: 11/22/2015 Goal Status: Active Interventions: Assess peripheral edema status every visit. AYLIN, WALLGREN ([id number]) Compression as ordered Provide education on venous insufficiency Treatment Activities: Non-invasive vascular studies : 11/22/2015 Therapeutic compression applied : 11/22/2015 Notes: Wound/Skin Impairment Nursing Diagnoses: Impaired tissue integrity Knowledge deficit related to ulceration/compromised skin integrity Goals: Patient/caregiver will verbalize understanding of skin care regimen Date Initiated: 11/22/2015 Goal Status: Active Ulcer/skin breakdown will have a volume reduction of 30% by week 4 Date Initiated: 11/22/2015 Goal Status: Active Ulcer/skin breakdown will have a volume reduction of 50% by week 8 Date Initiated: 11/22/2015 Goal Status: Active Ulcer/skin breakdown will have a volume reduction of 80% by week 12 Date Initiated: 11/22/2015 Goal Status: Active Ulcer/skin breakdown will heal within 14 weeks Date Initiated: 11/22/2015 Goal Status: Active Interventions: Assess  patient/caregiver ability to  perform ulcer/skin care regimen upon admission and as needed Assess ulceration(s) every visit Provide education on ulcer and skin care Treatment Activities: Skin care regimen initiated : 11/22/2015 Topical wound management initiated : 11/22/2015 Notes: Electronic Signature(s) Signed: 11/29/2015 5:48:23 PM By: De Burrs, [name] ([id number]) Entered By: [name] on 11/29/2015 13:18:12 Fitchett, Eric Loletha [name] ([id number]) -------------------------------------------------------------------------------- Pain Assessment Details Patient Name: Carrie Mulligan C. Date of Service: 11/29/2015 12:45 PM Medical Record Patient Account Number: [id number] Number: Treating RN: [name] [birth year] (80 y.o. Other Clinician: Date of Birth/Sex: Female) Treating Carrie, [name] Primary Care Physician/Extender: Claudette Laws Physician: Referring Physician: [name] in Treatment: 1 Active Problems Location of Pain Severity and Description of Pain Patient Has Paino No Site Locations Pain Management and Medication Current Pain Management: Electronic Signature(s) Signed: 11/29/2015 5:48:23 PM By: [name] Entered By: [name] on 11/29/2015 12:53:00 Carcamo, [name] ([id number]) -------------------------------------------------------------------------------- Patient/Caregiver Education Details Patient Name: [name]. Date of Service: 11/29/2015 12:45 PM Medical Record Patient Account Number: [id number] Number: Treating RN: [name] Dec 05, 1934 (80 y.o. Other Clinician: Date of Birth/Gender: Female) Treating Carrie, [name] Primary Care Physician/Extender: Claudette Laws Physician: Suella [place] in Treatment: 1 Referring Physician: [name] Education Assessment Education Provided To: Patient Education Topics Provided Wound/Skin Impairment: Handouts: Other: do not get wrap  wet Methods: Demonstration, Explain/Verbal Responses: State content correctly Electronic Signature(s) Signed: 11/29/2015 5:48:23 PM By: [name] Entered By: [name] on 11/29/2015 13:26:21 [name], [name] ([id number]) -------------------------------------------------------------------------------- Wound Assessment Details Patient Name: [name], [name] Date of Service: 11/29/2015 12:45 PM Medical Record Patient Account Number: [id number] Number: Treating RN: [name] [birth year] (80 y.o. Other Clinician: Date of Birth/Sex: Female) Treating Carrie, [name] Primary Care Physician/Extender: Claudette Laws Physician: Referring Physician: [name] in Treatment: 1 Wound Status Wound Number: 1 Primary Lymphedema Etiology: Wound Location: Left Lower Leg - Medial Wound Status: Open Wounding Event: Gradually Appeared Comorbid Cataracts, Arrhythmia, Hypotension, Date Acquired: 07/04/2015 History: Osteoarthritis Weeks Of Treatment: 1 Clustered Wound: No Photos Photo Uploaded By: [name] on 11/29/2015 17:47:46 Wound Measurements Length: (cm) 8 Width: (cm) 6.5 Depth: (cm) 0.2 Area: (cm) 40.841 Volume: (cm) 8.168 % Reduction in Area: 11.1% % Reduction in Volume: 11.1% Epithelialization: None Tunneling: No Undermining: No Wound Description Classification: Partial Thickness Wound Margin: Distinct, outline attached Exudate Amount: Large Exudate Type: Serosanguineous Exudate Color: red, brown Foul Odor After Cleansing: No Wound Bed Granulation Amount: Medium (34-66%) Exposed Structure Granulation Quality: Red Fascia Exposed: No [name], [name] ([id number]) Necrotic Amount: Medium (34-66%) Fat Layer Exposed: No Necrotic Quality: Adherent Slough Tendon Exposed: No Muscle Exposed: No Joint Exposed: No Bone Exposed: No Limited to Skin Breakdown Periwound Skin Texture Texture Color No Abnormalities Noted:  No No Abnormalities Noted: No Callus: No Atrophie Blanche: No Crepitus: No Cyanosis: No Excoriation: No Ecchymosis: No Fluctuance: No Erythema: No Friable: No Hemosiderin Staining: No Induration: No Mottled: No Localized Edema: No Pallor: No Rash: No Rubor: No Scarring: No Temperature / Pain Moisture Temperature: No Abnormality No Abnormalities Noted: No Tenderness on Palpation: Yes Dry / Scaly: No Maceration: No Moist: Yes Wound Preparation Ulcer Cleansing: Rinsed/Irrigated with Saline Topical Anesthetic Applied: Other: lidocaine 4%, Treatment Notes Wound #1 (Left, Medial Lower Leg) 1. Cleansed with: Cleanse wound with antibacterial soap and water 2. Anesthetic Topical Lidocaine 4% cream to wound bed prior to debridement 3. Peri-wound Care: Barrier cream 4. Dressing Applied: Aquacel Ag 5. Secondary Dressing Applied ABD Pad 7. Secured with Tape 3 Layer  Compression System - Bilateral Electronic Signature(s) Signed: 11/29/2015 5:48:23 PM By: De Burrs, [name] ([id number]) Entered By: [name] on 11/29/2015 13:16:41 Granda, [name] ([id number]) -------------------------------------------------------------------------------- Wound Assessment Details Patient Name: [name], [name] Date of Service: 11/29/2015 12:45 PM Medical Record Patient Account Number: [id number] Number: Treating RN: [name] 10-05-1934 (80 y.o. Other Clinician: Date of Birth/Sex: Female) Treating Carrie, [name] Primary Care Physician/Extender: Claudette Laws Physician: Referring Physician: [name] in Treatment: 1 Wound Status Wound Number: 2 Primary Lymphedema Etiology: Wound Location: Right Lower Leg - Lateral Wound Status: Open Wounding Event: Gradually Appeared Comorbid Cataracts, Arrhythmia, Hypotension, Date Acquired: 07/04/2015 History: Osteoarthritis Weeks Of Treatment: 1 Clustered Wound: No Photos Photo  Uploaded By: [name] on 11/29/2015 17:47:46 Wound Measurements Length: (cm) 1.6 % Reduction i Width: (cm) 1.2 % Reduction i Depth: (cm) 0.2 Epithelializa Area: (cm) 1.508 Tunneling: Volume: (cm) 0.302 Undermining: n Area: -6.6% n Volume: -114.2% tion: None No No Wound Description Classification: Partial Thickness Wound Margin: Distinct, outline attached Exudate Amount: Large Exudate Type: Serosanguineous Exudate Color: red, brown Foul Odor After Cleansing: No Wound Bed Granulation Amount: Large (67-100%) Exposed Structure Granulation Quality: Pink, Pale Fascia Exposed: No [name], [name] ([id number]) Necrotic Amount: Small (1-33%) Fat Layer Exposed: No Necrotic Quality: Adherent Slough Tendon Exposed: No Muscle Exposed: No Joint Exposed: No Bone Exposed: No Limited to Skin Breakdown Periwound Skin Texture Texture Color No Abnormalities Noted: No No Abnormalities Noted: No Callus: No Atrophie Blanche: No Crepitus: No Cyanosis: No Excoriation: No Ecchymosis: No Fluctuance: No Erythema: No Friable: No Hemosiderin Staining: No Induration: No Mottled: No Localized Edema: Yes Pallor: No Rash: No Rubor: No Scarring: No Temperature / Pain Moisture Temperature: No Abnormality No Abnormalities Noted: No Tenderness on Palpation: Yes Dry / Scaly: No Maceration: No Moist: Yes Wound Preparation Ulcer Cleansing: Rinsed/Irrigated with Saline Topical Anesthetic Applied: Other: lidocaine 4%, Treatment Notes Wound #2 (Right, Lateral Lower Leg) 1. Cleansed with: Cleanse wound with antibacterial soap and water 2. Anesthetic Topical Lidocaine 4% cream to wound bed prior to debridement 3. Peri-wound Care: Barrier cream 4. Dressing Applied: Aquacel Ag 5. Secondary Dressing Applied ABD Pad 7. Secured with Tape 3 Layer Compression System - Bilateral Electronic Signature(s) Signed: 11/29/2015 5:48:23 PM By: De Burrs, [name]  ([id number]) Entered By: [name] on 11/29/2015 13:15:17 [name], [name] ([id number]) -------------------------------------------------------------------------------- Vitals Details Patient Name: Carrie Mulligan C. Date of Service: 11/29/2015 12:45 PM Medical Record Patient Account Number: [id number] Number: Treating RN: [name] 12/04/1934 (80 y.o. Other Clinician: Date of Birth/Sex: Female) Treating Carrie, [name] Primary Care Physician/Extender: Claudette Laws Physician: Referring Physician: [name] in Treatment: 1 Vital Signs Time Taken: 12:53 Temperature (F): 97.7 Height (in): 68 Pulse (bpm): 48 Weight (lbs): 178 Respiratory Rate (breaths/min): 18 Body Mass Index (BMI): 27.1 Blood Pressure (mmHg): 101/34 Reference Range: 80 - 120 mg / dl Electronic Signature(s) Signed: 11/29/2015 5:48:23 PM By: [name] Entered By: [name] on 11/29/2015 12:57:12

## 2015-11-30 NOTE — Progress Notes (Signed)
[name], CHIDESTER ([id number]) Visit Report for 11/29/2015 Chief Complaint Document Details Carrie, [name] Date of Service: 11/29/2015 12:45 PM Patient Name: C. Patient Account Number: [id number] Medical Record Treating RN: [name] [id number] Number: Other Clinician: 10-03-1934 (81 y.o. Treating [name], [name] Date of Birth/Sex: Female) Physician/Extender: G Primary Care [name] Physician: Referring Physician: [name] in Treatment: 1 Information Obtained from: Patient Chief Complaint Patient is here for review of bilateral lower extremity wounds dating back to December 2016 Electronic Signature(s) Signed: 11/29/2015 5:25:36 PM By: [name] MD Entered By: [name] on 11/29/2015 15:37:06 [name], [name] ([id number]) -------------------------------------------------------------------------------- Debridement Details [name] Date of Service: 11/29/2015 12:45 PM Patient Name: C. Patient Account Number: [id number] Medical Record Treating RN: [name] [id number] Number: Other Clinician: Jun 09, 1935 (81 y.o. Treating [name], [place] Date of Birth/Sex: Female) Physician/Extender: G Primary Care [name] Physician: Referring Physician: [name] in Treatment: 1 Debridement Performed for Wound #1 Left,Medial Lower Leg Assessment: Performed By: Physician [name], MD Debridement: Debridement Pre-procedure Yes Verification/Time Out Taken: Start Time: 13:34 Pain Control: Lidocaine 4% Topical Solution Level: Skin/Subcutaneous Tissue Total Area Debrided (L x 8 (cm) x 6.5 (cm) = 52 (cm) W): Tissue and other Viable, Non-Viable, Exudate, Fibrin/Slough, Subcutaneous material debrided: Instrument: Curette Bleeding: Minimum Hemostasis Achieved: Pressure End Time: 13:36 Procedural Pain: 0 Post Procedural Pain: 0 Response to Treatment: Procedure was tolerated well Post Debridement Measurements of  Total Wound Length: (cm) 8 Width: (cm) 6.5 Depth: (cm) 0.2 Volume: (cm) 8.168 Post Procedure Diagnosis Same as Pre-procedure Electronic Signature(s) Signed: 11/29/2015 5:25:36 PM By: [name] MD Signed: 11/29/2015 5:48:23 PM By: [name] Entered By: [name] on 11/29/2015 15:36:21 [place], [name] ([id number]) [name], [name] ([id number]) -------------------------------------------------------------------------------- Debridement Details [name] Date of Service: 11/29/2015 12:45 PM Patient Name: C. Patient Account Number: [id number] Medical Record Treating RN: [name] [id number] Number: Other Clinician: 1935-01-20 (81 y.o. Treating [name], [name] Date of Birth/Sex: Female) Physician/Extender: G Primary Care [name] Physician: Referring Physician: [name] in Treatment: 1 Debridement Performed for Wound #2 Right,Lateral Lower Leg Assessment: Performed By: Physician [name], MD Debridement: Debridement Pre-procedure Yes Verification/Time Out Taken: Start Time: 13:32 Pain Control: Lidocaine 4% Topical Solution Level: Skin/Subcutaneous Tissue Total Area Debrided (L x 1.6 (cm) x 1.2 (cm) = 1.92 (cm) W): Tissue and other Viable, Non-Viable, Exudate, Fibrin/Slough, Subcutaneous material debrided: Instrument: Curette Bleeding: Minimum Hemostasis Achieved: Pressure End Time: 13:33 Procedural Pain: 0 Post Procedural Pain: 0 Response to Treatment: Procedure was tolerated well Post Debridement Measurements of Total Wound Length: (cm) 1.6 Width: (cm) 1.2 Depth: (cm) 0.2 Volume: (cm) 0.302 Post Procedure Diagnosis Same as Pre-procedure Electronic Signature(s) Signed: 11/29/2015 5:25:36 PM By: [name] MD Signed: 11/29/2015 5:48:23 PM By: [name] Entered By: [name] on 11/29/2015 15:36:45 Cong, [name] ([id number]) Chapin, [name]  ([id number]) -------------------------------------------------------------------------------- HPI Details [name] Date of Service: 11/29/2015 12:45 PM Patient Name: C. Patient Account Number: [id number] Medical Record Treating RN: [name] [id number] Number: Other Clinician: [birth year] (81 y.o. Treating [name], [name] Date of Birth/Sex: Female) Physician/Extender: G [place] Physician: Referring Physician: [name] in Treatment: 1 History of Present Illness HPI Description: 11/22/15; this is [name] is a 81 year old woman who lives at home on her own. According to the patient and her daughter was present she has had long-standing edema in her legs dating back many years. She also has a history of chronic systolic heart failure, atrial fibrillation and is status  post mitral valve replacement. [facility] showed an ejection fraction of 40-45% she is on Lasix 60 mg a day and spironolactone 25 mg a day. Her current problem began in December around Christmas time she developed a small hematoma in the medial part of her left leg which rapidly expanded to a very large hematoma that required surgical debridement. This situation was complicated by the fact that the patient is on long-standing Coumadin for mechanical heart valve. She went to the OR had this evacuated on January 4 17. The wound has gradually improved however she has developed a small wounds around this area and more recently a wound on the right lateral leg. She is weeping edema fluid. The patient is already been to see vascular surgery. It was recommended that she wear Unna boots, she did not tolerate this due to pain in the left leg. She was then prescribed Juzo stockings and really can't get these on herself although truthfully there is probably too much edema for a Juzo stockings currently. She is not a diabetic and has no history of PAD or claudication  that I could elicit. She does not use the external compression pumps reliably. She comes today with notes from her primary physician and Dr. Ronalee Belts both recommending various forms of compression but the patient does not really complied with them. Has been using the external compression pumps with some regularity but certainly not daily on the right leg and this has helped. I also note that her daughter tells me the history that she does not sleep in bed at home. She has a hospital bed but with her legs up she finds this painful so she sleeps in the couch sitting up with her legs dependent. 11/29/15; the patient is arrives today accompanied by her son. He expresses satisfaction that she is maintained the compression all week. Electronic Signature(s) Signed: 11/29/2015 5:25:36 PM By: [name] MD Entered By: [name] on 11/29/2015 15:38:02 [place], [name] ([id number]) -------------------------------------------------------------------------------- Physical Exam Details [name] Date of Service: 11/29/2015 12:45 PM Patient Name: C. Patient Account Number: [id number] Medical Record Treating RN: [name] [id number] Number: Other Clinician: Dec 17, 1934 (81 y.o. Treating [name], [name] Date of Birth/Sex: Female) Physician/Extender: G Primary Care [name] Physician: Referring Physician: [name] in Treatment: 1 Constitutional Sitting or standing Blood Pressure is within target range for patient.. Pulse regular and within target range for patient.Marland Kitchen Respirations regular, non-labored and within target range.. Temperature is normal and within the target range for the patient.. Patient's appearance is neat and clean. Appears in no acute distress. Well nourished and well developed.. Cardiovascular Pedal pulses palpable and strong bilaterally.. Edema present in both extremities. The edema however is much better than last week. Notes Wound exam; the  patient has now 3 open areas on the left leg which are small. She has one small wound on the right leg. All of these are debridement of surface slough and nonviable subcutaneous tissue. Now that there is better edema control there is less weeping edema coming out of the wound beds. Electronic Signature(s) Signed: 11/29/2015 5:25:36 PM By: [name] MD Entered By: [name] on 11/29/2015 15:40:35 Parisi, [name] ([id number]) -------------------------------------------------------------------------------- Physician Orders Details [name] Date of Service: 11/29/2015 12:45 PM Patient Name: C. Patient Account Number: [id number] Medical Record Treating RN: [name] [id number] Number: Other Clinician: 22-May-1935 (81 y.o. Treating [name], [place] Date of Birth/Sex: Female) Physician/Extender: G [place], Mark Physician: Referring Physician: [name]  Weeks in Treatment: 1 Verbal / Phone Orders: Yes Clinician: Pinkerton, Debi Read Back and Verified: Yes Diagnosis Coding Wound Cleansing Wound #1 Left,Medial Lower Leg o May shower with protection. o No tub bath. Wound #2 Right,Lateral Lower Leg o May shower with protection. o No tub bath. Anesthetic Wound #1 Left,Medial Lower Leg o Topical Lidocaine 4% cream applied to wound bed prior to debridement Wound #2 Right,Lateral Lower Leg o Topical Lidocaine 4% cream applied to wound bed prior to debridement Skin Barriers/Peri-Wound Care Wound #1 Left,Medial Lower Leg o Barrier cream Wound #2 Right,Lateral Lower Leg o Barrier cream Primary Wound Dressing Wound #1 Left,Medial Lower Leg o Aquacel Ag Wound #2 Right,Lateral Lower Leg o Aquacel Ag Secondary Dressing Wound #1 Left,Medial Lower Leg [place], Leshawn C. ([id number]) o ABD pad Wound #2 Right,Lateral Lower Leg o ABD pad Dressing Change Frequency Wound #1 Left,Medial Lower Leg o Change dressing every  week Wound #2 Right,Lateral Lower Leg o Change dressing every week Follow-up Appointments Wound #1 Left,Medial Lower Leg o Return Appointment in 1 week. Wound #2 Right,Lateral Lower Leg o Return Appointment in 1 week. Edema Control Wound #1 Left,Medial Lower Leg o 3 Layer Compression System - Bilateral - unna to anchor Wound #2 Right,Lateral Lower Leg o 3 Layer Compression System - Bilateral - unna to anchor Additional Orders / Instructions Wound #1 Left,Medial Lower Leg o Increase protein intake. o Activity as tolerated Wound #2 Right,Lateral Lower Leg o Increase protein intake. o Activity as tolerated Electronic Signature(s) Signed: 11/29/2015 5:25:36 PM By: [name] MD Signed: 11/29/2015 5:48:23 PM By: [name] Entered By: [name] on 11/29/2015 13:35:45 [place], [name] ([id number]) -------------------------------------------------------------------------------- Problem List Details [name] Date of Service: 11/29/2015 12:45 PM Patient Name: C. Patient Account Number: [id number] Medical Record Treating RN: [name] [id number] Number: Other Clinician: 02/25/1935 (81 y.o. Treating [name], [name] Date of Birth/Sex: Female) Physician/Extender: G Primary Care [name] Physician: Referring Physician: [name] in Treatment: 1 Active Problems ICD-10 Encounter Code Description Active Date Diagnosis I87.333 Chronic venous hypertension (idiopathic) with ulcer and 11/22/2015 Yes inflammation of bilateral lower extremity I89.0 Lymphedema, not elsewhere classified 11/22/2015 Yes [id number] Chronic systolic (congestive) heart failure 11/22/2015 Yes Inactive Problems Resolved Problems Electronic Signature(s) Signed: 11/29/2015 5:25:36 PM By: [name] MD Entered By: [name] on 11/29/2015 15:36:01 [place], [name]  ([id number]) -------------------------------------------------------------------------------- Progress Note Details [name] Date of Service: 11/29/2015 12:45 PM Patient Name: C. Patient Account Number: [id number] Medical Record Treating RN: [name] [id number] Number: Other Clinician: February 11, 1935 (81 y.o. Treating [name], [place] Date of Birth/Sex: Female) Physician/Extender: G Primary Care [name] Physician: Referring Physician: [name] in Treatment: 1 Subjective Chief Complaint Information obtained from Patient Patient is here for review of bilateral lower extremity wounds dating back to December 2016 History of Present Illness (HPI) 11/22/15; this is [place] is a 81 year old woman who lives at home on her own. According to the patient and her daughter was present she has had long-standing edema in her legs dating back many years. She also has a history of chronic systolic heart failure, atrial fibrillation and is status post mitral valve replacement. Last [facility]ogram [facility] showed an ejection fraction of 40-45% she is on Lasix 60 mg a day and spironolactone 25 mg a day. Her current problem began in December around Christmas time she developed a small hematoma in the medial part of her left leg which rapidly expanded to a very large hematoma that required surgical debridement.  This situation was complicated by the fact that the patient is on long-standing Coumadin for mechanical heart valve. She went to the OR had this evacuated on January 4 17. The wound has gradually improved however she has developed a small wounds around this area and more recently a wound on the right lateral leg. She is weeping edema fluid. The patient is already been to see vascular surgery. It was recommended that she wear Unna boots, she did not tolerate this due to pain in the left leg. She was then prescribed Juzo stockings and really can't get these on  herself although truthfully there is probably too much edema for a Juzo stockings currently. She is not a diabetic and has no history of PAD or claudication that I could elicit. She does not use the external compression pumps reliably. She comes today with notes from her primary physician and Dr. Ronalee Belts both recommending various forms of compression but the patient does not really complied with them. Has been using the external compression pumps with some regularity but certainly not daily on the right leg and this has helped. I also note that her daughter tells me the history that she does not sleep in bed at home. She has a hospital bed but with her legs up she finds this painful so she sleeps in the couch sitting up with her legs dependent. 11/29/15; the patient is arrives today accompanied by her son. He expresses satisfaction that she is maintained the compression all week. Objective Berrian, Vinetta C. ([id number]) Constitutional Sitting or standing Blood Pressure is within target range for patient.. Pulse regular and within target range for patient.Marland Kitchen Respirations regular, non-labored and within target range.. Temperature is normal and within the target range for the patient.. Patient's appearance is neat and clean. Appears in no acute distress. Well nourished and well developed.. Vitals Time Taken: 12:53 PM, Height: 68 in, Weight: 178 lbs, BMI: 27.1, Temperature: 97.7 F, Pulse: 48 bpm, Respiratory Rate: 18 breaths/min, Blood Pressure: 101/34 mmHg. Cardiovascular Pedal pulses palpable and strong bilaterally.. Edema present in both extremities. The edema however is much better than last week. General Notes: Wound exam; the patient has now 3 open areas on the left leg which are small. She has one small wound on the right leg. All of these are debridement of surface slough and nonviable subcutaneous tissue. Now that there is better edema control there is less weeping edema coming out of  the wound beds. Integumentary (Hair, Skin) Wound #1 status is Open. Original cause of wound was Gradually Appeared. The wound is located on the Left,Medial Lower Leg. The wound measures 8cm length x 6.5cm width x 0.2cm depth; 40.841cm^2 area and 8.168cm^3 volume. The wound is limited to skin breakdown. There is no tunneling or undermining noted. There is a large amount of serosanguineous drainage noted. The wound margin is distinct with the outline attached to the wound base. There is medium (34-66%) red granulation within the wound bed. There is a medium (34-66%) amount of necrotic tissue within the wound bed including Adherent Slough. The periwound skin appearance exhibited: Moist. The periwound skin appearance did not exhibit: Callus, Crepitus, Excoriation, Fluctuance, Friable, Induration, Localized Edema, Rash, Scarring, Dry/Scaly, Maceration, Atrophie Blanche, Cyanosis, Ecchymosis, Hemosiderin Staining, Mottled, Pallor, Rubor, Erythema. Periwound temperature was noted as No Abnormality. The periwound has tenderness on palpation. Wound #2 status is Open. Original cause of wound was Gradually Appeared. The wound is located on the Right,Lateral Lower Leg. The wound measures 1.6cm length x 1.2cm  width x 0.2cm depth; 1.508cm^2 area and 0.302cm^3 volume. The wound is limited to skin breakdown. There is no tunneling or undermining noted. There is a large amount of serosanguineous drainage noted. The wound margin is distinct with the outline attached to the wound base. There is large (67-100%) pink, pale granulation within the wound bed. There is a small (1-33%) amount of necrotic tissue within the wound bed including Adherent Slough. The periwound skin appearance exhibited: Localized Edema, Moist. The periwound skin appearance did not exhibit: Callus, Crepitus, Excoriation, Fluctuance, Friable, Induration, Rash, Scarring, Dry/Scaly, Maceration, Atrophie Blanche, Cyanosis, Ecchymosis, Hemosiderin  Staining, Mottled, Pallor, Rubor, Erythema. Periwound temperature was noted as No Abnormality. The periwound has tenderness on palpation. ERIN, JOSHUA ([id number]) Assessment Active Problems ICD-10 [phone] - Chronic venous hypertension (idiopathic) with ulcer and inflammation of bilateral lower extremity I89.0 - Lymphedema, not elsewhere classified [id number] - Chronic systolic (congestive) heart failure Procedures Wound #1 Wound #1 is a Lymphedema located on the Left,Medial Lower Leg . There was a Skin/Subcutaneous Tissue Debridement [id number]) debridement with total area of 52 sq cm performed by [name], MD. with the following instrument(s): Curette to remove Viable and Non-Viable tissue/material including Exudate, Fibrin/Slough, and Subcutaneous after achieving pain control using Lidocaine 4% Topical Solution. A time out was conducted prior to the start of the procedure. A Minimum amount of bleeding was controlled with Pressure. The procedure was tolerated well with a pain level of 0 throughout and a pain level of 0 following the procedure. Post Debridement Measurements: 8cm length x 6.5cm width x 0.2cm depth; 8.168cm^3 volume. Post procedure Diagnosis Wound #1: Same as Pre-Procedure Wound #2 Wound #2 is a Lymphedema located on the Right,Lateral Lower Leg . There was a Skin/Subcutaneous Tissue Debridement [id number]) debridement with total area of 1.92 sq cm performed by [name], MD. with the following instrument(s): Curette to remove Viable and Non-Viable tissue/material including Exudate, Fibrin/Slough, and Subcutaneous after achieving pain control using Lidocaine 4% Topical Solution. A time out was conducted prior to the start of the procedure. A Minimum amount of bleeding was controlled with Pressure. The procedure was tolerated well with a pain level of 0 throughout and a pain level of 0 following the procedure. Post Debridement Measurements: 1.6cm  length x 1.2cm width x 0.2cm depth; 0.302cm^3 volume. Post procedure Diagnosis Wound #2: Same as Pre-Procedure Plan Wound Cleansing: Wound #1 Left,Medial Lower Leg: May shower with protection. Sheets, Ikeisha C. ([id number]) No tub bath. Wound #2 Right,Lateral Lower Leg: May shower with protection. No tub bath. Anesthetic: Wound #1 Left,Medial Lower Leg: Topical Lidocaine 4% cream applied to wound bed prior to debridement Wound #2 Right,Lateral Lower Leg: Topical Lidocaine 4% cream applied to wound bed prior to debridement Skin Barriers/Peri-Wound Care: Wound #1 Left,Medial Lower Leg: Barrier cream Wound #2 Right,Lateral Lower Leg: Barrier cream Primary Wound Dressing: Wound #1 Left,Medial Lower Leg: Aquacel Ag Wound #2 Right,Lateral Lower Leg: Aquacel Ag Secondary Dressing: Wound #1 Left,Medial Lower Leg: ABD pad Wound #2 Right,Lateral Lower Leg: ABD pad Dressing Change Frequency: Wound #1 Left,Medial Lower Leg: Change dressing every week Wound #2 Right,Lateral Lower Leg: Change dressing every week Follow-up Appointments: Wound #1 Left,Medial Lower Leg: Return Appointment in 1 week. Wound #2 Right,Lateral Lower Leg: Return Appointment in 1 week. Edema Control: Wound #1 Left,Medial Lower Leg: 3 Layer Compression System - Bilateral - unna to anchor Wound #2 Right,Lateral Lower Leg: 3 Layer Compression System - Bilateral - unna to anchor Additional Orders / Instructions: Wound #1 Left,Medial  Lower Leg: Increase protein intake. Activity as tolerated Wound #2 Right,Lateral Lower Leg: Increase protein intake. Activity as tolerated Orlich, Ranya C. ([id number]) #1 condition of her legs in terms of edema is much better today. #2 she has 3 small wounds on the left leg and one on the right. All of these are roughly in the same state. All required debridement base of these appears healthy year there is less weeping edema #3 we used Aquacel Ag with all wounds ABD  over the surfaces of the areas to add to the compression. #4 she tolerated Profore light wraps well. I was somewhat tempted to put her in a Profore wrap #5 arterial studies are on June 19 all came from her history of having pain which was improved with keeping her legs recumbent and worsened when she laid down at night. I couldn't really get that history out of her today nevertheless I think the arterial studies are probably still indicated Electronic Signature(s) Signed: 11/29/2015 5:25:36 PM By: [name] MD Entered By: [name] on 11/29/2015 15:42:54 Stacks, [name] ([id number]) -------------------------------------------------------------------------------- SuperBill Details Patient Name: [name] Date of Service: 11/29/2015 Medical Record Patient Account Number: [id number] Number: Treating RN: [name] Nov 05, 1934 (80 y.o. Other Clinician: Date of Birth/Sex: Female) Treating [name], [name] Primary Care Physician/Extender: Claudette Laws Physician: Suella [place] in Treatment: 1 Referring Physician: [name] Diagnosis Coding ICD-10 Codes Code Description Chronic venous hypertension (idiopathic) with ulcer and inflammation of bilateral lower I87.333 extremity I89.0 Lymphedema, not elsewhere classified [id number] Chronic systolic (congestive) heart failure Facility Procedures CPT4: Description Modifier Quantity Code [id number] - DEB SUBQ TISSUE 20 SQ CM/< 1 ICD-10 Description Diagnosis I87.333 Chronic venous hypertension (idiopathic) with ulcer and inflammation of bilateral lower extremity Physician Procedures CPT4: Description Modifier Quantity Code [id number] - WC PHYS SUBQ TISS 20 SQ CM 1 ICD-10 Description Diagnosis I87.333 Chronic venous hypertension (idiopathic) with ulcer and inflammation of bilateral lower extremity Electronic Signature(s) Signed: 11/29/2015 5:25:36 PM By: [name] MD Entered By: [name] on  11/29/2015 15:43:54

## 2015-12-06 ENCOUNTER — Encounter: Payer: Medicare Other | Attending: Internal Medicine | Admitting: Internal Medicine

## 2015-12-06 DIAGNOSIS — I87333 Chronic venous hypertension (idiopathic) with ulcer and inflammation of bilateral lower extremity: Secondary | ICD-10-CM | POA: Diagnosis not present

## 2015-12-06 DIAGNOSIS — I5042 Chronic combined systolic (congestive) and diastolic (congestive) heart failure: Secondary | ICD-10-CM | POA: Diagnosis present

## 2015-12-06 DIAGNOSIS — Z79899 Other long term (current) drug therapy: Secondary | ICD-10-CM | POA: Diagnosis not present

## 2015-12-06 DIAGNOSIS — Z9889 Other specified postprocedural states: Secondary | ICD-10-CM | POA: Diagnosis not present

## 2015-12-06 DIAGNOSIS — I890 Lymphedema, not elsewhere classified: Secondary | ICD-10-CM | POA: Insufficient documentation

## 2015-12-06 DIAGNOSIS — Z7901 Long term (current) use of anticoagulants: Secondary | ICD-10-CM | POA: Diagnosis not present

## 2015-12-07 ENCOUNTER — Ambulatory Visit: Payer: Medicare Other

## 2015-12-07 NOTE — Progress Notes (Signed)
Carrie, [name] ([id number]) Visit Report for 12/06/2015 Arrival Information Details Patient Name: [name], [name]. Date of Service: 12/06/2015 3:00 PM Medical Record Patient Account Number: [id number] Number: Treating RN: [name] 1934/10/13 (81 y.o. Other Clinician: Date of Birth/Sex: Female) Treating [name], [name] Primary Care Physician/Extender: Claudette Laws Physician: Referring Physician: [name] in Treatment: 2 Visit Information History Since Last Visit All ordered tests and consults were completed: No Patient Arrived: Ambulatory Added or deleted any medications: No Arrival Time: 15:21 Any new allergies or adverse reactions: No Accompanied By: son Had a fall or experienced change in No Transfer Assistance: None activities of daily living that may affect Patient Identification Verified: Yes risk of falls: Secondary Verification Yes Signs or symptoms of abuse/neglect since last No Process Completed: visito Patient Requires No Hospitalized since last visit: No Transmission-Based Pain Present Now: No Precautions: Patient Has Alerts: Yes Patient Alerts: Patient on Blood Thinner [place] Signature(s) Signed: 12/06/2015 5:55:43 PM By: [name] Entered By: [name] on 12/06/2015 15:22:54 Cisney, [name] ([id number]) -------------------------------------------------------------------------------- Encounter Discharge Information Details Patient Name: Carrie Mulligan C. Date of Service: 12/06/2015 3:00 PM Medical Record Patient Account Number: [id number] Number: Treating RN: [name] 07-18-1934 (81 y.o. Other Clinician: Date of Birth/Sex: Female) Treating [name], [name] Primary Care Physician/Extender: Claudette Laws Physician: Referring Physician: [name] in Treatment: 2 Encounter Discharge Information Items Discharge Pain Level: 0 Discharge  Condition: Stable Ambulatory Status: Ambulatory Discharge Destination: Home Transportation: Private Auto Accompanied By: son Schedule Follow-up Appointment: Yes Medication Reconciliation completed and provided to Patient/Care Yes Diahann Guajardo: Provided on Clinical Summary of Care: 12/06/2015 Form Type Recipient Paper Patient [place] Electronic Signature(s) Signed: 12/06/2015 4:34:56 PM By: Ruthine Dose Entered By: Ruthine Dose on 12/06/2015 16:34:56 Mikel, Sian Loletha Grayer ([id number]) -------------------------------------------------------------------------------- Lower Extremity Assessment Details Patient Name: [name], Karyss C. Date of Service: 12/06/2015 3:00 PM Medical Record Patient Account Number: [id number] Number: Treating RN: [name] 10/03/1934 (81 y.o. Other Clinician: Date of Birth/Sex: Female) Treating [name], [name] Primary Care Physician/Extender: Claudette Laws Physician: Referring Physician: [name] in Treatment: 2 Edema Assessment Assessed: [Left: No] [Right: No] E[Left: dema] [Right: :] Calf Left: Right: Point of Measurement: 36 cm From Medial Instep 37.2 cm 36.4 cm Ankle Left: Right: Point of Measurement: 10 cm From Medial Instep 23.8 cm 24.6 cm Vascular Assessment Pulses: Posterior Tibial Dorsalis Pedis Palpable: [Left:Yes] [Right:Yes] Extremity colors, hair growth, and conditions: Extremity Color: [Left:Mottled] [Right:Mottled] Temperature of Extremity: [Left:Warm] [Right:Warm] Capillary Refill: [Left:< 3 seconds] [Right:< 3 seconds] Toe Nail Assessment Left: Right: Thick: Yes Yes Discolored: No No Deformed: No No Improper Length and Hygiene: No No Electronic Signature(s) Signed: 12/06/2015 5:55:43 PM By: [name] Entered By: [name] on 12/06/2015 15:41:00 Rodelo, Leronda C. ([id number]) Leonhart, Racquelle C.  ([id number]) -------------------------------------------------------------------------------- Multi Wound Chart Details Patient Name: [name], [name] Date of Service: 12/06/2015 3:00 PM Medical Record Patient Account Number: [id number] Number: Treating RN: [name] April 04, 1935 (81 y.o. Other Clinician: Date of Birth/Sex: Female) Treating [name], [name] Primary Care Physician/Extender: Claudette Laws Physician: Referring Physician: [name] in Treatment: 2 Vital Signs Height(in): 68 Pulse(bpm): 48 Weight(lbs): 178 Blood Pressure 130/44 (mmHg): Body Mass Index(BMI): 27 Temperature(F): 98.3 Respiratory Rate 18 (breaths/min): Photos: [1:No Photos] [2:No Photos] [N/A:N/A] Wound Location: [1:Left Lower Leg - Medial] [2:Right Lower Leg - Lateral] [N/A:N/A] Wounding Event: [1:Gradually Appeared] [2:Gradually Appeared] [N/A:N/A] Primary Etiology: [1:Lymphedema] [2:Lymphedema] [N/A:N/A] Comorbid History: [1:Cataracts, Arrhythmia, Hypotension, Osteoarthritis] [2:Cataracts, Arrhythmia, Hypotension, Osteoarthritis] [N/A:N/A]  Date Acquired: [1:07/04/2015] [2:07/04/2015] [N/A:N/A] Weeks of Treatment: [1:2] [2:2] [N/A:N/A] Wound Status: [1:Open] [2:Open] [N/A:N/A] Measurements L x W x D 9x6.5x0.2 [2:1.7x1.2x0.1] [N/A:N/A] (cm) Area (cm) : [1:45.946] [2:1.602] [N/A:N/A] Volume (cm) : [1:9.189] [2:0.16] [N/A:N/A] % Reduction in Area: [1:0.00%] [2:-13.30%] [N/A:N/A] % Reduction in Volume: 0.00% [2:-13.50%] [N/A:N/A] Classification: [1:Partial Thickness] [2:Partial Thickness] [N/A:N/A] Exudate Amount: [1:Large] [2:Large] [N/A:N/A] Exudate Type: [1:Serosanguineous] [2:Serosanguineous] [N/A:N/A] Exudate Color: [1:red, brown] [2:red, brown] [N/A:N/A] Wound Margin: [1:Distinct, outline attached] [2:Distinct, outline attached] [N/A:N/A] Granulation Amount: [1:Medium (34-66%)] [2:Small (1-33%)] [N/A:N/A] Granulation Quality: [1:Red] [2:Pink, Pale]  [N/A:N/A] Necrotic Amount: [1:Medium (34-66%)] [2:Large (67-100%)] [N/A:N/A] Exposed Structures: [1:Fascia: No Fat: No Tendon: No] [2:Fascia: No Fat: No Tendon: No] [N/A:N/A] Muscle: No Muscle: No Joint: No Joint: No Bone: No Bone: No Limited to Skin Limited to Skin Breakdown Breakdown Epithelialization: None None N/A Periwound Skin Texture: Edema: No Edema: Yes N/A Excoriation: No Excoriation: No Induration: No Induration: No Callus: No Callus: No Crepitus: No Crepitus: No Fluctuance: No Fluctuance: No Friable: No Friable: No Rash: No Rash: No Scarring: No Scarring: No Periwound Skin Moist: Yes Moist: Yes N/A Moisture: Maceration: No Maceration: No Dry/Scaly: No Dry/Scaly: No Periwound Skin Color: Atrophie Blanche: No Atrophie Blanche: No N/A Cyanosis: No Cyanosis: No Ecchymosis: No Ecchymosis: No Erythema: No Erythema: No Hemosiderin Staining: No Hemosiderin Staining: No Mottled: No Mottled: No Pallor: No Pallor: No Rubor: No Rubor: No Temperature: No Abnormality No Abnormality N/A Tenderness on Yes Yes N/A Palpation: Wound Preparation: Ulcer Cleansing: Ulcer Cleansing: N/A Rinsed/Irrigated with Rinsed/Irrigated with Saline Saline Topical Anesthetic Topical Anesthetic Applied: Other: lidocaine Applied: Other: lidocaine 4% 4% Treatment Notes Electronic Signature(s) Signed: 12/06/2015 5:55:43 PM By: [name] Entered By: [name] on 12/06/2015 15:44:20 Fauth, [name] ([id number]) -------------------------------------------------------------------------------- [place] Details Patient Name: [name] Date of Service: 12/06/2015 3:00 PM Medical Record Patient Account Number: [id number] Number: Treating RN: [name] 1935/01/30 (80 y.o. Other Clinician: Date of Birth/Sex: Female) Treating [name], [name] Primary Care Physician/Extender: Claudette Laws Physician: Referring  Physician: [name] in Treatment: 2 Active Inactive Orientation to the Wound Care Program Nursing Diagnoses: Knowledge deficit related to the wound healing center program Goals: Patient/caregiver will verbalize understanding of the [facility] Date Initiated: 11/22/2015 Goal Status: Active Interventions: Provide education on orientation to the wound center Notes: Venous Leg Ulcer Nursing Diagnoses: Knowledge deficit related to disease process and management Potential for venous Insuffiency (use before diagnosis confirmed) Goals: Patient will maintain optimal edema control Date Initiated: 11/22/2015 Goal Status: Active Patient/caregiver will verbalize understanding of disease process and disease management Date Initiated: 11/22/2015 Goal Status: Active Verify adequate tissue perfusion prior to therapeutic compression application Date Initiated: 11/22/2015 Goal Status: Active Interventions: Assess peripheral edema status every visit. KA, GAYDON ([id number]) Compression as ordered Provide education on venous insufficiency Treatment Activities: Non-invasive vascular studies : 11/22/2015 Therapeutic compression applied : 11/22/2015 Notes: Wound/Skin Impairment Nursing Diagnoses: Impaired tissue integrity Knowledge deficit related to ulceration/compromised skin integrity Goals: Patient/caregiver will verbalize understanding of skin care regimen Date Initiated: 11/22/2015 Goal Status: Active Ulcer/skin breakdown will have a volume reduction of 30% by week 4 Date Initiated: 11/22/2015 Goal Status: Active Ulcer/skin breakdown will have a volume reduction of 50% by week 8 Date Initiated: 11/22/2015 Goal Status: Active Ulcer/skin breakdown will have a volume reduction of 80% by week 12 Date Initiated: 11/22/2015 Goal Status: Active Ulcer/skin breakdown will heal within 14 weeks Date Initiated: 11/22/2015 Goal Status: Active Interventions: Assess  patient/caregiver ability to  perform ulcer/skin care regimen upon admission and as needed Assess ulceration(s) every visit Provide education on ulcer and skin care Treatment Activities: Skin care regimen initiated : 11/22/2015 Topical wound management initiated : 11/22/2015 Notes: Electronic Signature(s) Signed: 12/06/2015 5:55:43 PM By: De Burrs, [name] ([id number]) Entered By: [name] on 12/06/2015 15:44:14 Milillo, Kathelyn C. ([id number]) -------------------------------------------------------------------------------- Pain Assessment Details Patient Name: Carrie Mulligan C. Date of Service: 12/06/2015 3:00 PM Medical Record Patient Account Number: [id number] Number: Treating RN: [name] [birth year] (80 y.o. Other Clinician: Date of Birth/Sex: Female) Treating [name], [name] Primary Care Physician/Extender: Claudette Laws Physician: Referring Physician: [name] in Treatment: 2 Active Problems Location of Pain Severity and Description of Pain Patient Has Paino No Site Locations Pain Management and Medication Current Pain Management: Electronic Signature(s) Signed: 12/06/2015 5:55:43 PM By: [name] Entered By: [name] on 12/06/2015 15:23:03 Maynor, [name] ([id number]) -------------------------------------------------------------------------------- Patient/Caregiver Education Details Patient Name: [name] Date of Service: 12/06/2015 3:00 PM Medical Record Patient Account Number: [id number] Number: Treating RN: [name] 06-05-1935 (80 y.o. Other Clinician: Date of Birth/Gender: Female) Treating [name], [name] Primary Care Physician/Extender: Claudette Laws Physician: Suella [place] in Treatment: 2 Referring Physician: [name] Education Assessment Education Provided To: Patient Education Topics Provided Wound/Skin Impairment: Handouts: Other: do not get wraps  wet Methods: Demonstration, Explain/Verbal Responses: State content correctly Electronic Signature(s) Signed: 12/06/2015 5:55:43 PM By: [name] Entered By: [name] on 12/06/2015 16:05:20 Moorman, [name] ([id number]) -------------------------------------------------------------------------------- Wound Assessment Details Patient Name: Carrie Mulligan C. Date of Service: 12/06/2015 3:00 PM Medical Record Patient Account Number: [id number] Number: Treating RN: [name] May 30, 1935 (80 y.o. Other Clinician: Date of Birth/Sex: Female) Treating [name], [name] Primary Care Physician/Extender: Claudette Laws Physician: Referring Physician: [name] in Treatment: 2 Wound Status Wound Number: 1 Primary Lymphedema Etiology: Wound Location: Left Lower Leg - Medial Wound Status: Open Wounding Event: Gradually Appeared Comorbid Cataracts, Arrhythmia, Hypotension, Date Acquired: 07/04/2015 History: Osteoarthritis Weeks Of Treatment: 2 Clustered Wound: No Photos Photo Uploaded By: [name] on 12/06/2015 16:50:06 Wound Measurements Length: (cm) 9 Width: (cm) 6.5 Depth: (cm) 0.2 Area: (cm) 45.946 Volume: (cm) 9.189 % Reduction in Area: 0% % Reduction in Volume: 0% Epithelialization: None Tunneling: No Undermining: No Wound Description Classification: Partial Thickness Wound Margin: Distinct, outline attached Exudate Amount: Large Exudate Type: Serosanguineous Exudate Color: red, brown Foul Odor After Cleansing: No Wound Bed Granulation Amount: Medium (34-66%) Exposed Structure Granulation Quality: Red Fascia Exposed: No Spagnuolo, Morrison C. ([id number]) Necrotic Amount: Medium (34-66%) Fat Layer Exposed: No Necrotic Quality: Adherent Slough Tendon Exposed: No Muscle Exposed: No Joint Exposed: No Bone Exposed: No Limited to Skin Breakdown Periwound Skin Texture Texture Color No Abnormalities Noted: No No  Abnormalities Noted: No Callus: No Atrophie Blanche: No Crepitus: No Cyanosis: No Excoriation: No Ecchymosis: No Fluctuance: No Erythema: No Friable: No Hemosiderin Staining: No Induration: No Mottled: No Localized Edema: No Pallor: No Rash: No Rubor: No Scarring: No Temperature / Pain Moisture Temperature: No Abnormality No Abnormalities Noted: No Tenderness on Palpation: Yes Dry / Scaly: No Maceration: No Moist: Yes Wound Preparation Ulcer Cleansing: Rinsed/Irrigated with Saline Topical Anesthetic Applied: Other: lidocaine 4%, Treatment Notes Wound #1 (Left, Medial Lower Leg) 1. Cleansed with: Cleanse wound with antibacterial soap and water 2. Anesthetic Topical Lidocaine 4% cream to wound bed prior to debridement 3. Peri-wound Care: Barrier cream 4. Dressing Applied: Other dressing (specify in notes) 5. Secondary Dressing Applied ABD Pad Dry Gauze 7.  Secured with Tape 3 Layer Compression System - Bilateral Electronic Signature(s) LORANN, ZENDER ([id number]) Signed: 12/06/2015 5:55:43 PM By: [name] Entered By: [name] on 12/06/2015 15:42:52 Struve, Katty Loletha Grayer ([id number]) -------------------------------------------------------------------------------- Wound Assessment Details Patient Name: [name], Jalaya C. Date of Service: 12/06/2015 3:00 PM Medical Record Patient Account Number: [id number] Number: Treating RN: [name] 1934/08/13 (80 y.o. Other Clinician: Date of Birth/Sex: Female) Treating [name], [name] Primary Care Physician/Extender: Claudette Laws Physician: Referring Physician: [name] in Treatment: 2 Wound Status Wound Number: 2 Primary Lymphedema Etiology: Wound Location: Right Lower Leg - Lateral Wound Status: Open Wounding Event: Gradually Appeared Comorbid Cataracts, Arrhythmia, Hypotension, Date Acquired: 07/04/2015 History: Osteoarthritis Weeks Of Treatment: 2 Clustered Wound:  No Photos Photo Uploaded By: [name] on 12/06/2015 16:50:06 Wound Measurements Length: (cm) 1.7 % Reduction i Width: (cm) 1.2 % Reduction i Depth: (cm) 0.1 Epithelializa Area: (cm) 1.602 Tunneling: Volume: (cm) 0.16 Undermining: n Area: -13.3% n Volume: -13.5% tion: None No No Wound Description Classification: Partial Thickness Wound Margin: Distinct, outline attached Exudate Amount: Large Exudate Type: Serosanguineous Exudate Color: red, brown Foul Odor After Cleansing: No Wound Bed Granulation Amount: Small (1-33%) Exposed Structure Granulation Quality: Pink, Pale Fascia Exposed: No Frisinger, Amberlie C. ([id number]) Necrotic Amount: Large (67-100%) Fat Layer Exposed: No Necrotic Quality: Adherent Slough Tendon Exposed: No Muscle Exposed: No Joint Exposed: No Bone Exposed: No Limited to Skin Breakdown Periwound Skin Texture Texture Color No Abnormalities Noted: No No Abnormalities Noted: No Callus: No Atrophie Blanche: No Crepitus: No Cyanosis: No Excoriation: No Ecchymosis: No Fluctuance: No Erythema: No Friable: No Hemosiderin Staining: No Induration: No Mottled: No Localized Edema: Yes Pallor: No Rash: No Rubor: No Scarring: No Temperature / Pain Moisture Temperature: No Abnormality No Abnormalities Noted: No Tenderness on Palpation: Yes Dry / Scaly: No Maceration: No Moist: Yes Wound Preparation Ulcer Cleansing: Rinsed/Irrigated with Saline Topical Anesthetic Applied: Other: lidocaine 4%, Treatment Notes Wound #2 (Right, Lateral Lower Leg) 1. Cleansed with: Cleanse wound with antibacterial soap and water 2. Anesthetic Topical Lidocaine 4% cream to wound bed prior to debridement 3. Peri-wound Care: Barrier cream 4. Dressing Applied: Other dressing (specify in notes) 5. Secondary Dressing Applied ABD Pad Dry Gauze 7. Secured with Tape 3 Layer Compression System - Bilateral Electronic Signature(s) MAYANA, DUBOISE  ([id number]) Signed: 12/06/2015 5:55:43 PM By: [name] Entered By: [name] on 12/06/2015 15:42:10 Housewright, [name] ([id number]) -------------------------------------------------------------------------------- Vitals Details Patient Name: Carrie Mulligan C. Date of Service: 12/06/2015 3:00 PM Medical Record Patient Account Number: [id number] Number: Treating RN: [name] 1934-10-30 (80 y.o. Other Clinician: Date of Birth/Sex: Female) Treating [name], [name] Primary Care Physician/Extender: Claudette Laws Physician: Referring Physician: [name] in Treatment: 2 Vital Signs Time Taken: 15:23 Temperature (F): 98.3 Height (in): 68 Pulse (bpm): 48 Weight (lbs): 178 Respiratory Rate (breaths/min): 18 Body Mass Index (BMI): 27.1 Blood Pressure (mmHg): 130/44 Reference Range: 80 - 120 mg / dl Electronic Signature(s) Signed: 12/06/2015 5:55:43 PM By: [name] Entered By: [name] on 12/06/2015 15:25:10

## 2015-12-07 NOTE — Progress Notes (Signed)
ARISTEA, HEFEL ([id number]) Visit Report for 12/06/2015 Chief Complaint Document Details [name], [name] Date of Service: 12/06/2015 3:00 PM Patient Name: C. Patient Account Number: [id number] Medical Record Treating RN: [name] [id number] Number: Other Clinician: [birth year] (81 y.o. Treating [name] Date of Birth/Sex: Female) Physician/Extender: G Primary Care [name] Physician: Referring Physician: [name] in Treatment: 2 Information Obtained from: Patient Chief Complaint Patient is here for review of bilateral lower extremity wounds dating back to December 2016 Electronic Signature(s) Signed: 12/06/2015 5:53:31 PM By: [name] MD Entered By: [name] on 12/06/2015 16:06:32 [place], [name] ([id number]) -------------------------------------------------------------------------------- Debridement Details [name] Date of Service: 12/06/2015 3:00 PM Patient Name: C. Patient Account Number: [id number] Medical Record Treating RN: [name] [id number] Number: Other Clinician: 02-Apr-1935 (81 y.o. Treating [name] Date of Birth/Sex: Female) Physician/Extender: G Primary Care [name] Physician: Referring Physician: [name] in Treatment: 2 Debridement Performed for Wound #1 Left,Medial Lower Leg Assessment: Performed By: Physician [name], MD Debridement: Debridement Pre-procedure Yes Verification/Time Out Taken: Start Time: 16:02 Pain Control: Lidocaine 4% Topical Solution Level: Skin/Subcutaneous Tissue Total Area Debrided (L x 9 (cm) x 6.5 (cm) = 58.5 (cm) W): Tissue and other Viable, Non-Viable, Exudate, Fibrin/Slough, Subcutaneous material debrided: Instrument: Curette Bleeding: Minimum Hemostasis Achieved: Pressure End Time: 16:04 Procedural Pain: 0 Post Procedural Pain: 0 Response to Treatment: Procedure was tolerated well Post Debridement Measurements of Total  Wound Length: (cm) 9 Width: (cm) 6.5 Depth: (cm) 0.3 Volume: (cm) 13.784 Post Procedure Diagnosis Same as Pre-procedure Electronic Signature(s) Signed: 12/06/2015 5:53:31 PM By: [name] MD Signed: 12/06/2015 5:55:43 PM By: [name] Entered By: [name] on 12/06/2015 16:05:49 [place], [name] ([id number]) Mcall[place], [name] ([id number]) -------------------------------------------------------------------------------- Debridement Details [name] Date of Service: 12/06/2015 3:00 PM Patient Name: C. Patient Account Number: [id number] Medical Record Treating RN: [name] [id number] Number: Other Clinician: July 26, 1934 (81 y.o. Treating [name] Date of Birth/Sex: Female) Physician/Extender: G Primary Care [name] Physician: Referring Physician: [name] in Treatment: 2 Debridement Performed for Wound #2 Right,Lateral Lower Leg Assessment: Performed By: Physician [name], MD Debridement: Debridement Pre-procedure Yes Verification/Time Out Taken: Start Time: 15:59 Pain Control: Lidocaine 4% Topical Solution Level: Skin/Subcutaneous Tissue Total Area Debrided (L x 1.7 (cm) x 1.2 (cm) = 2.04 (cm) W): Tissue and other Viable, Non-Viable, Exudate, Fibrin/Slough, Subcutaneous material debrided: Instrument: Curette Bleeding: Minimum Hemostasis Achieved: Pressure End Time: 16:01 Procedural Pain: 0 Post Procedural Pain: 0 Response to Treatment: Procedure was tolerated well Post Debridement Measurements of Total Wound Length: (cm) 1.7 Width: (cm) 1.2 Depth: (cm) 0.2 Volume: (cm) 0.32 Post Procedure Diagnosis Same as Pre-procedure Electronic Signature(s) Signed: 12/06/2015 5:53:31 PM By: [name] MD Signed: 12/06/2015 5:55:43 PM By: [name] Entered By: [name] on 12/06/2015 16:06:07 [place], [name] ([id number]) [name], [name]  ([id number]) -------------------------------------------------------------------------------- HPI Details [name] Date of Service: 12/06/2015 3:00 PM Patient Name: C. Patient Account Number: [id number] Medical Record Treating RN: [name] [id number] Number: Other Clinician: [birth year] (81 y.o. Treating [name], [name] Date of Birth/Sex: Female) Physician/Extender: G [place] Physician: Referring Physician: [name] in Treatment: 2 History of Present Illness HPI Description: 11/22/15; this is Altimari is a 81 year old woman who lives at home on her own. According to the patient and her daughter was present she has had long-standing edema in her legs dating back many years. She also has a history of chronic systolic heart failure, atrial fibrillation and is status  post mitral valve replacement. [facility] showed an ejection fraction of 40-45% she is on Lasix 60 mg a day and spironolactone 25 mg a day. Her current problem began in December around Christmas time she developed a small hematoma in the medial part of her left leg which rapidly expanded to a very large hematoma that required surgical debridement. This situation was complicated by the fact that the patient is on long-standing Coumadin for mechanical heart valve. She went to the OR had this evacuated on January 4 17. The wound has gradually improved however she has developed a small wounds around this area and more recently a wound on the right lateral leg. She is weeping edema fluid. The patient is already been to see vascular surgery. It was recommended that she wear Unna boots, she did not tolerate this due to pain in the left leg. She was then prescribed Juzo stockings and really can't get these on herself although truthfully there is probably too much edema for a Juzo stockings currently. She is not a diabetic and has no history of PAD or claudication  that I could elicit. She does not use the external compression pumps reliably. She comes today with notes from her primary physician and Dr. Ronalee Belts both recommending various forms of compression but the patient does not really complied with them. Has been using the external compression pumps with some regularity but certainly not daily on the right leg and this has helped. I also note that her daughter tells me the history that she does not sleep in bed at home. She has a hospital bed but with her legs up she finds this painful so she sleeps in the couch sitting up with her legs dependent. 11/29/15; the patient is arrives today accompanied by her son. He expresses satisfaction that she is maintained the compression all week. 12/06/15; the patient has kept her Profore light wraps on, we have good edema control no major change in the wounds we have been using Aquacel Electronic Signature(s) Signed: 12/06/2015 5:53:31 PM By: [name] MD Entered By: [name] on 12/06/2015 16:07:09 [place], [name] ([id number]) -------------------------------------------------------------------------------- Physical Exam Details [name] Date of Service: 12/06/2015 3:00 PM Patient Name: C. Patient Account Number: [id number] Medical Record Treating RN: [name] [id number] Number: Other Clinician: [birth year] (81 y.o. Treating Lavena Loretto Date of Birth/Sex: Female) Physician/Extender: G Primary Care [name] Physician: Referring Physician: [name] in Treatment: 2 Notes Wound exam; 3 open areas on the left leg, the most inferior one probably has 0.2-0.3 mm in depth. All of these require debridement. She has one area on the right lateral leg also debridement. The base of these wounds on presentation today simply will not support healing Electronic Signature(s) Signed: 12/06/2015 5:53:31 PM By: [name] MD Entered By: [name] on 12/06/2015  16:08:01 [name] ([id number]) -------------------------------------------------------------------------------- Physician Orders Details [name] Date of Service: 12/06/2015 3:00 PM Patient Name: C. Patient Account Number: [id number] Medical Record Treating RN: [name] [id number] Number: Other Clinician: 06-20-1935 (81 y.o. Treating Granville Whitefield, [place] Date of Birth/Sex: Female) Physician/Extender: G Primary Care [name] Physician: Referring Physician: [name] in Treatment: 2 Verbal / Phone Orders: Yes Clinician: Carolyne Fiscal, Debi Read Back and Verified: Yes Diagnosis Coding Wound Cleansing Wound #1 Left,Medial Lower Leg o May shower with protection. o No tub bath. Wound #2 Right,Lateral Lower Leg o May shower with protection. o No tub bath. Anesthetic Wound #1 Left,Medial Lower Leg o  Topical Lidocaine 4% cream applied to wound bed prior to debridement Wound #2 Right,Lateral Lower Leg o Topical Lidocaine 4% cream applied to wound bed prior to debridement Skin Barriers/Peri-Wound Care Wound #1 Left,Medial Lower Leg o Barrier cream Wound #2 Right,Lateral Lower Leg o Barrier cream Primary Wound Dressing Wound #1 Left,Medial Lower Leg o Other: - RTD Wound #2 Right,Lateral Lower Leg o Other: - RTD Secondary Dressing Wound #1 Left,Medial Lower Leg [place], [name] ([id number]) o ABD pad o Dry Gauze Wound #2 Right,Lateral Lower Leg o ABD pad o Dry Gauze Dressing Change Frequency Wound #1 Left,Medial Lower Leg o Change dressing every week Wound #2 Right,Lateral Lower Leg o Change dressing every week Follow-up Appointments Wound #1 Left,Medial Lower Leg o Return Appointment in 1 week. Wound #2 Right,Lateral Lower Leg o Return Appointment in 1 week. Edema Control Wound #1 Left,Medial Lower Leg o 3 Layer Compression System - Bilateral - unna to anchor Wound #2 Right,Lateral Lower Leg o  3 Layer Compression System - Bilateral - unna to anchor Additional Orders / Instructions Wound #1 Left,Medial Lower Leg o Increase protein intake. o Activity as tolerated Wound #2 Right,Lateral Lower Leg o Increase protein intake. o Activity as tolerated Electronic Signature(s) Signed: 12/06/2015 5:53:31 PM By: [name] MD Signed: 12/06/2015 5:55:43 PM By: [name] Entered By: [name] on 12/06/2015 16:03:56 [name], [name] ([id number]) -------------------------------------------------------------------------------- Problem List Details [name] Date of Service: 12/06/2015 3:00 PM Patient Name: C. Patient Account Number: [id number] Medical Record Treating RN: [name] [id number] Number: Other Clinician: 1935/04/12 (81 y.o. Treating Plummer Matich Date of Birth/Sex: Female) Physician/Extender: G Primary Care [name] Physician: Referring Physician: [name] in Treatment: 2 Active Problems ICD-10 Encounter Code Description Active Date Diagnosis I87.333 Chronic venous hypertension (idiopathic) with ulcer and 11/22/2015 Yes inflammation of bilateral lower extremity I89.0 Lymphedema, not elsewhere classified 11/22/2015 Yes [id number] Chronic systolic (congestive) heart failure 11/22/2015 Yes Inactive Problems Resolved Problems Electronic Signature(s) Signed: 12/06/2015 5:53:31 PM By: [name] MD Entered By: [name] on 12/06/2015 16:05:34 [place], [name] ([id number]) -------------------------------------------------------------------------------- Progress Note Details [name] Date of Service: 12/06/2015 3:00 PM Patient Name: C. Patient Account Number: [id number] Medical Record Treating RN: [name] [id number] Number: Other Clinician: 03/29/1935 (81 y.o. Treating Josean Lycan, [place] Date of Birth/Sex: Female) Physician/Extender: G Primary Care [name] Physician: Referring Physician:  [name] in Treatment: 2 Subjective Chief Complaint Information obtained from Patient Patient is here for review of bilateral lower extremity wounds dating back to December 2016 History of Present Illness (HPI) 11/22/15; this is Lyson is a 81 year old woman who lives at home on her own. According to the patient and her daughter was present she has had long-standing edema in her legs dating back many years. She also has a history of chronic systolic heart failure, atrial fibrillation and is status post mitral valve replacement. Last [facility]ogram [facility] showed an ejection fraction of 40-45% she is on Lasix 60 mg a day and spironolactone 25 mg a day. Her current problem began in December around Christmas time she developed a small hematoma in the medial part of her left leg which rapidly expanded to a very large hematoma that required surgical debridement. This situation was complicated by the fact that the patient is on long-standing Coumadin for mechanical heart valve. She went to the OR had this evacuated on January 4 17. The wound has gradually improved however she has developed a small wounds around this area and more recently  a wound on the right lateral leg. She is weeping edema fluid. The patient is already been to see vascular surgery. It was recommended that she wear Unna boots, she did not tolerate this due to pain in the left leg. She was then prescribed Juzo stockings and really can't get these on herself although truthfully there is probably too much edema for a Juzo stockings currently. She is not a diabetic and has no history of PAD or claudication that I could elicit. She does not use the external compression pumps reliably. She comes today with notes from her primary physician and Dr. Ronalee Belts both recommending various forms of compression but the patient does not really complied with them. Has been using the external compression pumps with some  regularity but certainly not daily on the right leg and this has helped. I also note that her daughter tells me the history that she does not sleep in bed at home. She has a hospital bed but with her legs up she finds this painful so she sleeps in the couch sitting up with her legs dependent. 11/29/15; the patient is arrives today accompanied by her son. He expresses satisfaction that she is maintained the compression all week. 12/06/15; the patient has kept her Profore light wraps on, we have good edema control no major change in the wounds we have been using Aquacel [name], [name] ([id number]) Objective Constitutional Vitals Time Taken: 3:23 PM, Height: 68 in, Weight: 178 lbs, BMI: 27.1, Temperature: 98.3 F, Pulse: 48 bpm, Respiratory Rate: 18 breaths/min, Blood Pressure: 130/44 mmHg. Integumentary (Hair, Skin) Wound #1 status is Open. Original cause of wound was Gradually Appeared. The wound is located on the Left,Medial Lower Leg. The wound measures 9cm length x 6.5cm width x 0.2cm depth; 45.946cm^2 area and 9.189cm^3 volume. The wound is limited to skin breakdown. There is no tunneling or undermining noted. There is a large amount of serosanguineous drainage noted. The wound margin is distinct with the outline attached to the wound base. There is medium (34-66%) red granulation within the wound bed. There is a medium (34-66%) amount of necrotic tissue within the wound bed including Adherent Slough. The periwound skin appearance exhibited: Moist. The periwound skin appearance did not exhibit: Callus, Crepitus, Excoriation, Fluctuance, Friable, Induration, Localized Edema, Rash, Scarring, Dry/Scaly, Maceration, Atrophie Blanche, Cyanosis, Ecchymosis, Hemosiderin Staining, Mottled, Pallor, Rubor, Erythema. Periwound temperature was noted as No Abnormality. The periwound has tenderness on palpation. Wound #2 status is Open. Original cause of wound was Gradually Appeared. The wound is  located on the Right,Lateral Lower Leg. The wound measures 1.7cm length x 1.2cm width x 0.1cm depth; 1.602cm^2 area and 0.16cm^3 volume. The wound is limited to skin breakdown. There is no tunneling or undermining noted. There is a large amount of serosanguineous drainage noted. The wound margin is distinct with the outline attached to the wound base. There is small (1-33%) pink, pale granulation within the wound bed. There is a large (67-100%) amount of necrotic tissue within the wound bed including Adherent Slough. The periwound skin appearance exhibited: Localized Edema, Moist. The periwound skin appearance did not exhibit: Callus, Crepitus, Excoriation, Fluctuance, Friable, Induration, Rash, Scarring, Dry/Scaly, Maceration, Atrophie Blanche, Cyanosis, Ecchymosis, Hemosiderin Staining, Mottled, Pallor, Rubor, Erythema. Periwound temperature was noted as No Abnormality. The periwound has tenderness on palpation. Assessment Active Problems ICD-10 I87.333 - Chronic venous hypertension (idiopathic) with ulcer and inflammation of bilateral lower extremity I89.0 - Lymphedema, not elsewhere classified [id number] - Chronic systolic (congestive) heart failure [name], Carrie  C. ([id number]) Procedures Wound #1 Wound #1 is a Lymphedema located on the Left,Medial Lower Leg . There was a Skin/Subcutaneous Tissue Debridement [id number]) debridement with total area of 58.5 sq cm performed by [name], MD. with the following instrument(s): Curette to remove Viable and Non-Viable tissue/material including Exudate, Fibrin/Slough, and Subcutaneous after achieving pain control using Lidocaine 4% Topical Solution. A time out was conducted prior to the start of the procedure. A Minimum amount of bleeding was controlled with Pressure. The procedure was tolerated well with a pain level of 0 throughout and a pain level of 0 following the procedure. Post Debridement Measurements: 9cm length x 6.5cm width  x 0.3cm depth; 13.784cm^3 volume. Post procedure Diagnosis Wound #1: Same as Pre-Procedure Wound #2 Wound #2 is a Lymphedema located on the Right,Lateral Lower Leg . There was a Skin/Subcutaneous Tissue Debridement [id number]) debridement with total area of 2.04 sq cm performed by [name], MD. with the following instrument(s): Curette to remove Viable and Non-Viable tissue/material including Exudate, Fibrin/Slough, and Subcutaneous after achieving pain control using Lidocaine 4% Topical Solution. A time out was conducted prior to the start of the procedure. A Minimum amount of bleeding was controlled with Pressure. The procedure was tolerated well with a pain level of 0 throughout and a pain level of 0 following the procedure. Post Debridement Measurements: 1.7cm length x 1.2cm width x 0.2cm depth; 0.32cm^3 volume. Post procedure Diagnosis Wound #2: Same as Pre-Procedure Plan Wound Cleansing: Wound #1 Left,Medial Lower Leg: May shower with protection. No tub bath. Wound #2 Right,Lateral Lower Leg: May shower with protection. No tub bath. Anesthetic: Wound #1 Left,Medial Lower Leg: Topical Lidocaine 4% cream applied to wound bed prior to debridement Wound #2 Right,Lateral Lower Leg: Topical Lidocaine 4% cream applied to wound bed prior to debridement Skin Barriers/Peri-Wound Care: Wound #1 Left,Medial Lower Leg: Barrier cream Wound #2 Right,Lateral Lower Leg: Barrier cream Cieslewicz, Kajsa C. ([id number]) Primary Wound Dressing: Wound #1 Left,Medial Lower Leg: Other: - RTD Wound #2 Right,Lateral Lower Leg: Other: - RTD Secondary Dressing: Wound #1 Left,Medial Lower Leg: ABD pad Dry Gauze Wound #2 Right,Lateral Lower Leg: ABD pad Dry Gauze Dressing Change Frequency: Wound #1 Left,Medial Lower Leg: Change dressing every week Wound #2 Right,Lateral Lower Leg: Change dressing every week Follow-up Appointments: Wound #1 Left,Medial Lower Leg: Return  Appointment in 1 week. Wound #2 Right,Lateral Lower Leg: Return Appointment in 1 week. Edema Control: Wound #1 Left,Medial Lower Leg: 3 Layer Compression System - Bilateral - unna to anchor Wound #2 Right,Lateral Lower Leg: 3 Layer Compression System - Bilateral - unna to anchor Additional Orders / Instructions: Wound #1 Left,Medial Lower Leg: Increase protein intake. Activity as tolerated Wound #2 Right,Lateral Lower Leg: Increase protein intake. Activity as tolerated change the primary dressing to RTD. still profore lite consider iodoflex if there is still too musch surface slough Electronic Signature(s) Signed: 12/06/2015 5:53:31 PM By: [name] MD [name], [name] ([id number]) Entered By: [name] on 12/06/2015 16:09:09 [name], [name] ([id number]) -------------------------------------------------------------------------------- SuperBill Details Patient Name: [name] C. Date of Service: 12/06/2015 Medical Record Patient Account Number: [id number] Number: Treating RN: [name] July 12, 1934 (80 y.o. Other Clinician: Date of Birth/Sex: Female) Treating Karen Huhta Primary Care Physician/Extender: Claudette Laws Physician: Suella [place] in Treatment: 2 Referring Physician: Emily [name] Diagnosis Coding ICD-10 Codes Code Description Chronic venous hypertension (idiopathic) with ulcer and inflammation of bilateral lower I87.333 extremity I89.0 Lymphedema, not elsewhere classified [id number] Chronic systolic (congestive) heart failure Facility Procedures CPT4:  Description Modifier Quantity Code [id number] - DEB SUBQ TISSUE 20 SQ CM/< 1 ICD-10 Description Diagnosis I87.333 Chronic venous hypertension (idiopathic) with ulcer and inflammation of bilateral lower extremity Physician Procedures CPT4: Description Modifier Quantity Code [id number] - WC PHYS SUBQ TISS 20 SQ CM 1 ICD-10 Description Diagnosis I87.333 Chronic venous  hypertension (idiopathic) with ulcer and inflammation of bilateral lower extremity Electronic Signature(s) Signed: 12/06/2015 5:53:31 PM By: [name] MD Entered By: [name] on 12/06/2015 16:09:54

## 2015-12-11 ENCOUNTER — Ambulatory Visit: Payer: Medicare Other

## 2015-12-11 ENCOUNTER — Encounter: Payer: Self-pay | Admitting: *Deleted

## 2015-12-11 ENCOUNTER — Telehealth: Payer: Self-pay | Admitting: *Deleted

## 2015-12-11 DIAGNOSIS — Z952 Presence of prosthetic heart valve: Secondary | ICD-10-CM

## 2015-12-11 NOTE — Telephone Encounter (Signed)
Marigny's son called to say [name] fell and broke her her tail bone and is very sore. She said she is sorry she can't attend Cardiac Rehab. but she is sore and on pain medicine. I tried to call her son back at [phone] but his vm was full.

## 2015-12-13 ENCOUNTER — Ambulatory Visit: Payer: Medicare Other

## 2015-12-13 ENCOUNTER — Encounter: Payer: Medicare Other | Admitting: Internal Medicine

## 2015-12-13 DIAGNOSIS — I5042 Chronic combined systolic (congestive) and diastolic (congestive) heart failure: Secondary | ICD-10-CM | POA: Diagnosis not present

## 2015-12-14 ENCOUNTER — Ambulatory Visit: Payer: Medicare Other

## 2015-12-18 ENCOUNTER — Ambulatory Visit: Payer: Medicare Other

## 2015-12-18 ENCOUNTER — Encounter: Payer: Medicare Other | Admitting: Surgery

## 2015-12-18 DIAGNOSIS — I5042 Chronic combined systolic (congestive) and diastolic (congestive) heart failure: Secondary | ICD-10-CM | POA: Diagnosis not present

## 2015-12-19 ENCOUNTER — Ambulatory Visit: Payer: Medicare Other | Admitting: Internal Medicine

## 2015-12-19 NOTE — Progress Notes (Signed)
Carrie, [name] ([id number]) Visit Report for 12/18/2015 Chief Complaint Document Details Patient Name: [name], [name] 12/18/2015 1:30 Date of Service: PM Medical Record [id number] Number: Patient Account Number: [id number] 27-Apr-1935 (81 y.o. Treating RN: [name] Date of Birth/Sex: Female) Other Clinician: Primary Care Physician: [name] Treating Christin Fudge Referring Physician: [name] Physician/Extender: Suella Grove in Treatment: 3 Information Obtained from: Patient Chief Complaint Patient is here for review of bilateral lower extremity wounds dating back to December 2016 Electronic Signature(s) Signed: 12/18/2015 1:24:24 PM By: Christin Fudge MD, FACS Entered By: Christin Fudge on 12/18/2015 13:24:24 [name] ([id number]) -------------------------------------------------------------------------------- HPI Details Patient Name: [name], [name] 12/18/2015 1:30 Date of Service: PM Medical Record [id number] Number: Patient Account Number: [id number] [birth year] (81 y.o. Treating RN: [name] Date of Birth/Sex: Female) Other Clinician: Primary Care Physician: [name] Treating Christin Fudge Referring Physician: [name] Physician/Extender: Suella Grove in Treatment: 3 History of Present Illness HPI Description: 11/22/15; this is [name] is a 81 year old woman who lives at home on her own. According to the patient and her daughter was present she has had long-standing edema in her legs dating back many years. She also has a history of chronic systolic heart failure, atrial fibrillation and is status post mitral valve replacement. Last echocardiogram I see in [facility] showed an ejection fraction of 40-45% she is on Lasix 60 mg a day and spironolactone 25 mg a day. Her current problem began in December around Christmas time she developed a small hematoma in the medial part of her left leg which rapidly expanded to a very large  hematoma that required surgical debridement. This situation was complicated by the fact that the patient is on long-standing Coumadin for mechanical heart valve. She went to the OR had this evacuated on January 4 17. The wound has gradually improved however she has developed a small wounds around this area and more recently a wound on the right lateral leg. She is weeping edema fluid. The patient is already been to see vascular surgery. It was recommended that she wear Unna boots, she did not tolerate this due to pain in the left leg. She was then prescribed Juzo stockings and really can't get these on herself although truthfully there is probably too much edema for a Juzo stockings currently. She is not a diabetic and has no history of PAD or claudication that I could elicit. She does not use the external compression pumps reliably. She comes today with notes from her primary physician and Dr. Ronalee Belts both recommending various forms of compression but the patient does not really complied with them. Has been using the external compression pumps with some regularity but certainly not daily on the right leg and this has helped. I also note that her daughter tells me the history that she does not sleep in bed at home. She has a hospital bed but with her legs up she finds this painful so she sleeps in the couch sitting up with her legs dependent. 11/29/15; the patient is arrives today accompanied by her son. He expresses satisfaction that she is maintained the compression all week. 12/06/15; the patient has kept her Profore light wraps on, we have good edema control no major change in the wounds we have been using Aquacel 12/13/15; changed to RTD last week. One of the 3 wounds on her left medial leg is healed she has 2 remaining wounds here and one on the right lateral leg. 12/18/2015 -- the patient was at Dr. Nino [name]  office today and he was seeing her for an arterial study. While the wrap was being  removed she had an inadvertent laceration of the left proximal anterior leg which bled quite profusely and a compression dressing was applied over this and the patient was here to get her Profore wraps done. I was asked to see the patient to make an assessment and treat appropriately. Electronic Signature(s) Signed: 12/18/2015 1:26:17 PM By: Christin Fudge MD, FACS Entered By: Christin Fudge on 12/18/2015 13:26:17 ELESE, WICKERT ([id number]) -------------------------------------------------------------------------------- Physical Exam Details Patient Name: [name], [name] 12/18/2015 1:30 Date of Service: PM Medical Record [id number] Number: Patient Account Number: [id number] 1935-03-28 (81 y.o. Treating RN: [name] Date of Birth/Sex: Female) Other Clinician: Primary Care Physician: [name] Treating Christin Fudge Referring Physician: [name] Physician/Extender: Suella Grove in Treatment: 3 Constitutional . Pulse regular. Respirations normal and unlabored. Afebrile. . Eyes Nonicteric. Reactive to light. Ears, Nose, Mouth, and Throat Lips, teeth, and gums WNL.Marland Kitchen Moist mucosa without lesions. Neck supple and nontender. No palpable supraclavicular or cervical adenopathy. Normal sized without goiter. Respiratory WNL. No retractions.. Cardiovascular Pedal Pulses WNL. No clubbing, cyanosis or edema. Chest Breasts symmetical and no nipple discharge.. Breast tissue WNL, no masses, lumps, or tenderness.. Lymphatic No adneopathy. No adenopathy. No adenopathy. Musculoskeletal Adexa without tenderness or enlargement.. Digits and nails w/o clubbing, cyanosis, infection, petechiae, ischemia, or inflammatory conditions.. Integumentary (Hair, Skin) No suspicious lesions. No crepitus or fluctuance. No peri-wound warmth or erythema. No masses.Marland Kitchen Psychiatric Judgement and insight Intact.. No evidence of depression, anxiety, or agitation.. Notes the area on the right lateral  leg is still open and has minimal slough which was washed out with saline. She has another open area on the left medial lower extremity and this is fairly clean. Will continue to use our daily over these 2 wounds. The new wound is a lacerated wound on the left proximal anterior leg about 8 inches below the knee joint. The bleeding has stopped with compression and I have used half inch Steri-Strips to try and bring the skin to cover the lacerated area and held in place. we will apply an appropriate padding and reapply her Profore wraps. Electronic Signature(s) ZURA, LANCTO ([id number]) Signed: 12/18/2015 1:27:55 PM By: Christin Fudge MD, FACS Entered By: Christin Fudge on 12/18/2015 13:27:55 JARLENE, ACIERNO ([id number]) -------------------------------------------------------------------------------- Physician Orders Details Patient Name: DELENE, LAMBO 12/18/2015 1:30 Date of Service: PM Medical Record [id number] Number: Patient Account Number: [id number] December 15, 1934 (81 y.o. Treating RN: [name] Date of Birth/Sex: Female) Other Clinician: Primary Care Physician: [name] Treating Christin Fudge Referring Physician: [name] Physician/Extender: Suella Grove in Treatment: 3 Verbal / Phone Orders: Yes Clinician: Carolyne Fiscal, Debi Read Back and Verified: Yes Diagnosis Coding Wound Cleansing Wound #1 Left,Medial Lower Leg o May shower with protection. o No tub bath. Wound #3 Left,Proximal,Anterior Lower Leg o May shower with protection. o No tub bath. o May shower with protection. o No tub bath. Wound #2 Right,Lateral Lower Leg o May shower with protection. o No tub bath. Anesthetic Wound #1 Left,Medial Lower Leg o Topical Lidocaine 4% cream applied to wound bed prior to debridement Wound #3 Left,Proximal,Anterior Lower Leg o Topical Lidocaine 4% cream applied to wound bed prior to debridement o Topical Lidocaine 4% cream applied to  wound bed prior to debridement Wound #2 Right,Lateral Lower Leg o Topical Lidocaine 4% cream applied to wound bed prior to debridement Skin Barriers/Peri-Wound Care Wound #1 Left,Medial Lower Leg o Barrier cream Wound #2 Right,Lateral Lower  Leg o Barrier cream Primary Wound Dressing Wound #1 Left,Medial Lower Leg MARZEE, KEADY. ([id number]) o Other: - RTD Wound #2 Right,Lateral Lower Leg o Other: - RTD Wound #3 Left,Proximal,Anterior Lower Leg o Other: - steri-strips Secondary Dressing Wound #1 Left,Medial Lower Leg o ABD pad o Dry Gauze Wound #2 Right,Lateral Lower Leg o ABD pad o Dry Gauze Wound #3 Left,Proximal,Anterior Lower Leg o Dry Gauze Dressing Change Frequency Wound #1 Left,Medial Lower Leg o Change dressing every week Wound #2 Right,Lateral Lower Leg o Change dressing every week Follow-up Appointments Wound #1 Left,Medial Lower Leg o Return Appointment in 1 week. Wound #2 Right,Lateral Lower Leg o Return Appointment in 1 week. Edema Control Wound #1 Left,Medial Lower Leg o 3 Layer Compression System - Bilateral - unna to anchor Wound #2 Right,Lateral Lower Leg o 3 Layer Compression System - Bilateral - unna to anchor Wound #3 Left,Proximal,Anterior Lower Leg o 3 Layer Compression System - Bilateral - unna to anchor Additional Orders / Instructions Wound #1 Left,Medial Lower Leg o Increase protein intake. TALAIJAH, MOREHOUSE ([id number]) o Activity as tolerated Wound #3 Left,Proximal,Anterior Lower Leg o Increase protein intake. o Activity as tolerated Wound #2 Right,Lateral Lower Leg o Increase protein intake. o Activity as tolerated Electronic Signature(s) Signed: 12/18/2015 5:05:24 PM By: Christin Fudge MD, FACS Signed: 12/18/2015 5:39:56 PM By: [name] Entered By: [name] on 12/18/2015 13:21:03 [name]  ([id number]) -------------------------------------------------------------------------------- Problem List Details Patient Name: HILDER, [name] 12/18/2015 1:30 Date of Service: PM Medical Record [id number] Number: Patient Account Number: [id number] [birth year] (81 y.o. Treating RN: [name] Date of Birth/Sex: Female) Other Clinician: Primary Care Physician: [name] Treating Christin Fudge Referring Physician: [name] Physician/Extender: Suella Grove in Treatment: 3 Active Problems ICD-10 Encounter Code Description Active Date Diagnosis I87.333 Chronic venous hypertension (idiopathic) with ulcer and 11/22/2015 Yes inflammation of bilateral lower extremity I89.0 Lymphedema, not elsewhere classified 11/22/2015 Yes [id number] Chronic systolic (congestive) heart failure 11/22/2015 Yes S81.812A Laceration without foreign body, left lower leg, initial 12/18/2015 Yes encounter Inactive Problems Resolved Problems Electronic Signature(s) Signed: 12/18/2015 1:22:54 PM By: Christin Fudge MD, FACS Entered By: Christin Fudge on 12/18/2015 13:22:54 [name] ([id number]) -------------------------------------------------------------------------------- Progress Note Details Patient Name: [name] 12/18/2015 1:30 Date of Service: PM Medical Record [id number] Number: Patient Account Number: [id number] 1935/02/21 (81 y.o. Treating RN: [name] Date of Birth/Sex: Female) Other Clinician: Primary Care Physician: [name] Treating Christin Fudge Referring Physician: [name] Physician/Extender: Suella Grove in Treatment: 3 Subjective Chief Complaint Information obtained from Patient Patient is here for review of bilateral lower extremity wounds dating back to December 2016 History of Present Illness (HPI) 11/22/15; this is Pynes is a 81 year old woman who lives at home on her own. According to the patient and her daughter was present she has had  long-standing edema in her legs dating back many years. She also has a history of chronic systolic heart failure, atrial fibrillation and is status post mitral valve replacement. Last echocardiogram I see in [facility] showed an ejection fraction of 40-45% she is on Lasix 60 mg a day and spironolactone 25 mg a day. Her current problem began in December around Christmas time she developed a small hematoma in the medial part of her left leg which rapidly expanded to a very large hematoma that required surgical debridement. This situation was complicated by the fact that the patient is on long-standing Coumadin for mechanical heart valve. She went to the OR had this evacuated on January 4  17. The wound has gradually improved however she has developed a small wounds around this area and more recently a wound on the right lateral leg. She is weeping edema fluid. The patient is already been to see vascular surgery. It was recommended that she wear Unna boots, she did not tolerate this due to pain in the left leg. She was then prescribed Juzo stockings and really can't get these on herself although truthfully there is probably too much edema for a Juzo stockings currently. She is not a diabetic and has no history of PAD or claudication that I could elicit. She does not use the external compression pumps reliably. She comes today with notes from her primary physician and Dr. Ronalee Belts both recommending various forms of compression but the patient does not really complied with them. Has been using the external compression pumps with some regularity but certainly not daily on the right leg and this has helped. I also note that her daughter tells me the history that she does not sleep in bed at home. She has a hospital bed but with her legs up she finds this painful so she sleeps in the couch sitting up with her legs dependent. 11/29/15; the patient is arrives today accompanied by her son. He expresses  satisfaction that she is maintained the compression all week. 12/06/15; the patient has kept her Profore light wraps on, we have good edema control no major change in the wounds we have been using Aquacel 12/13/15; changed to RTD last week. One of the 3 wounds on her left medial leg is healed she has 2 remaining wounds here and one on the right lateral leg. 12/18/2015 -- the patient was at Dr. Nino [name] office today and he was seeing her for an arterial study. While the wrap was being removed she had an inadvertent laceration of the left proximal anterior leg which bled quite profusely and a compression dressing was applied over this and the patient was here to get her Profore wraps done. I was asked to see the patient to make an assessment and treat appropriately. Weinfeld, Payzlee C. ([id number]) Objective Constitutional Pulse regular. Respirations normal and unlabored. Afebrile. Vitals Time Taken: 12:40 PM, Height: 68 in, Weight: 178 lbs, BMI: 27.1, Pulse: 43 bpm, Respiratory Rate: 18 breaths/min, Blood Pressure: 118/58 mmHg. Eyes Nonicteric. Reactive to light. Ears, Nose, Mouth, and Throat Lips, teeth, and gums WNL.Marland Kitchen Moist mucosa without lesions. Neck supple and nontender. No palpable supraclavicular or cervical adenopathy. Normal sized without goiter. Respiratory WNL. No retractions.. Cardiovascular Pedal Pulses WNL. No clubbing, cyanosis or edema. Chest Breasts symmetical and no nipple discharge.. Breast tissue WNL, no masses, lumps, or tenderness.. Lymphatic No adneopathy. No adenopathy. No adenopathy. Musculoskeletal Adexa without tenderness or enlargement.. Digits and nails w/o clubbing, cyanosis, infection, petechiae, ischemia, or inflammatory conditions.Marland Kitchen Psychiatric Judgement and insight Intact.. No evidence of depression, anxiety, or agitation.. General Notes: the area on the right lateral leg is still open and has minimal slough which was washed out with saline. She has  another open area on the left medial lower extremity and this is fairly clean. Will continue to use our daily over these 2 wounds. The new wound is a lacerated wound on the left proximal anterior leg about 8 inches below the knee joint. The bleeding has stopped with compression and I have used half inch Steri-Strips to try and bring the skin to cover the lacerated area and held in place. we will apply an appropriate padding and reapply her Profore  wraps. Integumentary (Hair, Skin) No suspicious lesions. No crepitus or fluctuance. No peri-wound warmth or erythema. No masses.[name], Davionna Loletha Grayer ([id number]) Wound #1 status is Open. Original cause of wound was Gradually Appeared. The wound is located on the Left,Medial Lower Leg. The wound measures 1cm length x 0.7cm width x 0.3cm depth; 0.55cm^2 area and 0.165cm^3 volume. The wound is limited to skin breakdown. There is no tunneling or undermining noted. There is a large amount of serosanguineous drainage noted. The wound margin is distinct with the outline attached to the wound base. There is medium (34-66%) red granulation within the wound bed. There is a medium (34-66%) amount of necrotic tissue within the wound bed including Adherent Slough. The periwound skin appearance exhibited: Moist. The periwound skin appearance did not exhibit: Callus, Crepitus, Excoriation, Fluctuance, Friable, Induration, Localized Edema, Rash, Scarring, Dry/Scaly, Maceration, Atrophie Blanche, Cyanosis, Ecchymosis, Hemosiderin Staining, Mottled, Pallor, Rubor, Erythema. Periwound temperature was noted as No Abnormality. The periwound has tenderness on palpation. Wound #2 status is Open. Original cause of wound was Gradually Appeared. The wound is located on the Right,Lateral Lower Leg. The wound measures 1.2cm length x 1cm width x 0.2cm depth; 0.942cm^2 area and 0.188cm^3 volume. The wound is limited to skin breakdown. There is no tunneling or undermining noted.  There is a large amount of serosanguineous drainage noted. The wound margin is distinct with the outline attached to the wound base. There is medium (34-66%) pink, pale granulation within the wound bed. There is a medium (34-66%) amount of necrotic tissue within the wound bed including Adherent Slough. The periwound skin appearance exhibited: Localized Edema, Moist. The periwound skin appearance did not exhibit: Callus, Crepitus, Excoriation, Fluctuance, Friable, Induration, Rash, Scarring, Dry/Scaly, Maceration, Atrophie Blanche, Cyanosis, Ecchymosis, Hemosiderin Staining, Mottled, Pallor, Rubor, Erythema. Periwound temperature was noted as No Abnormality. The periwound has tenderness on palpation. Wound #3 status is Open. Original cause of wound was Trauma. The wound is located on the Left,Proximal,Anterior Lower Leg. The wound measures 3.8cm length x 1cm width x 0.2cm depth; 2.985cm^2 area and 0.597cm^3 volume. The wound is limited to skin breakdown. There is no tunneling or undermining noted. There is a large amount of serosanguineous drainage noted. The wound margin is flat and intact. There is large (67-100%) red, pink granulation within the wound bed. There is no necrotic tissue within the wound bed. The periwound skin appearance exhibited: Localized Edema, Moist. Periwound temperature was noted as No Abnormality. The periwound has tenderness on palpation. Assessment Active Problems ICD-10 I87.333 - Chronic venous hypertension (idiopathic) with ulcer and inflammation of bilateral lower extremity I89.0 - Lymphedema, not elsewhere classified [id number] - Chronic systolic (congestive) heart failure [id number] - Laceration without foreign body, left lower leg, initial encounter Hardenbrook, Agatha C. ([id number]) After steri- stripping the lacerated wound on her left below-knee region, I have recommended we continue application of RTD to the 2 other wounds on the right and left lower extremity and  use 3 layer Profore compressions. Her ABI results are not back yet and hopefully by the next week, once we have them, we may be able to go to a 4-layer Profore. She will see Dr. Dellia Nims next Tuesday. Plan Wound Cleansing: Wound #1 Left,Medial Lower Leg: May shower with protection. No tub bath. Wound #3 Left,Proximal,Anterior Lower Leg: May shower with protection. No tub bath. May shower with protection. No tub bath. Wound #2 Right,Lateral Lower Leg: May shower with protection. No tub bath. Anesthetic: Wound #1 Left,Medial Lower Leg: Topical Lidocaine 4% cream applied  to wound bed prior to debridement Wound #3 Left,Proximal,Anterior Lower Leg: Topical Lidocaine 4% cream applied to wound bed prior to debridement Topical Lidocaine 4% cream applied to wound bed prior to debridement Wound #2 Right,Lateral Lower Leg: Topical Lidocaine 4% cream applied to wound bed prior to debridement Skin Barriers/Peri-Wound Care: Wound #1 Left,Medial Lower Leg: Barrier cream Wound #2 Right,Lateral Lower Leg: Barrier cream Primary Wound Dressing: Wound #1 Left,Medial Lower Leg: Other: - RTD Wound #2 Right,Lateral Lower Leg: Other: - RTD Wound #3 Left,Proximal,Anterior Lower Leg: Other: - steri-strips Secondary Dressing: Wound #1 Left,Medial Lower Leg: ABD pad Dry Gauze Wound #2 Right,Lateral Lower Leg: ABD pad Dry Gauze Wound #3 Left,Proximal,Anterior Lower Leg: RYLI, DEPETRO C. ([id number]) Dry Gauze Dressing Change Frequency: Wound #1 Left,Medial Lower Leg: Change dressing every week Wound #2 Right,Lateral Lower Leg: Change dressing every week Follow-up Appointments: Wound #1 Left,Medial Lower Leg: Return Appointment in 1 week. Wound #2 Right,Lateral Lower Leg: Return Appointment in 1 week. Edema Control: Wound #1 Left,Medial Lower Leg: 3 Layer Compression System - Bilateral - unna to anchor Wound #2 Right,Lateral Lower Leg: 3 Layer Compression System - Bilateral - unna to  anchor Wound #3 Left,Proximal,Anterior Lower Leg: 3 Layer Compression System - Bilateral - unna to anchor Additional Orders / Instructions: Wound #1 Left,Medial Lower Leg: Increase protein intake. Activity as tolerated Wound #3 Left,Proximal,Anterior Lower Leg: Increase protein intake. Activity as tolerated Wound #2 Right,Lateral Lower Leg: Increase protein intake. Activity as tolerated After steri- stripping the lacerated wound on her left below-knee region, I have recommended we continue application of RTD to the 2 other wounds on the right and left lower extremity and use 3 layer Profore compressions. Her ABI results are not back yet and hopefully by the next week, once we have them, we may be able to go to a 4-layer Profore. She will see Dr. Dellia Nims next Tuesday. Electronic Signature(s) Signed: 12/18/2015 1:29:58 PM By: Christin Fudge MD, FACS Entered By: Christin Fudge on 12/18/2015 13:29:57 Pettie, [name] ([id number]) -------------------------------------------------------------------------------- SuperBill Details Patient Name: Carrie Mulligan C. Date of Service: 12/18/2015 Medical Record Number: [id number] Patient Account Number: [id number] Date of Birth/Sex: 08-04-1934 (80 y.o. Female) Treating RN: [name] Primary Care Physician: [name] Other Clinician: Referring Physician: [name] Treating Physician/Extender: Frann Rider in Treatment: 3 Diagnosis Coding ICD-10 Codes Code Description Chronic venous hypertension (idiopathic) with ulcer and inflammation of bilateral lower I87.333 extremity I89.0 Lymphedema, not elsewhere classified [id number] Chronic systolic (congestive) heart failure [id number] Laceration without foreign body, left lower leg, initial encounter Facility Procedures CPT4: Description Modifier Quantity Code [id number] BILATERAL: Application of multi-layer venous compression 1 system; leg (below knee), including ankle and  foot. Physician Procedures CPT4: Description Modifier Quantity Code [id number] - WC PHYS LEVEL 3 - EST PT 1 ICD-10 Description Diagnosis I87.333 Chronic venous hypertension (idiopathic) with ulcer and inflammation of bilateral lower extremity I89.0 Lymphedema, not elsewhere  classified [id number] Chronic systolic (congestive) heart failure [id number] Laceration without foreign body, left lower leg, initial encounter Electronic Signature(s) Signed: 12/18/2015 5:05:24 PM By: Christin Fudge MD, FACS Signed: 12/18/2015 5:39:56 PM By: [name] Previous Signature: 12/18/2015 1:30:12 PM Version By: Christin Fudge MD, FACS Entered By: [name] on 12/18/2015 13:56:53

## 2015-12-19 NOTE — Progress Notes (Signed)
Carrie, [name] ([id number]) Visit Report for 12/18/2015 Arrival Information Details Patient Name: [name], [name]. Date of Service: 12/18/2015 1:30 PM Medical Record Number: [id number] Patient Account Number: [id number] Date of Birth/Sex: 06-08-1935 (80 y.o. Female) Treating RN: [name] Primary Care Physician: [name] Other Clinician: Referring Physician: [name] Treating Physician/Extender: Frann Rider in Treatment: 3 Visit Information History Since Last Visit All ordered tests and consults were completed: No Patient Arrived: Ambulatory Added or deleted any medications: No Arrival Time: 12:32 Any new allergies or adverse reactions: No Accompanied By: self Had a fall or experienced change in No Transfer Assistance: None activities of daily living that may affect Patient Identification Verified: Yes risk of falls: Secondary Verification Yes Signs or symptoms of abuse/neglect since last No Process Completed: visito Patient Requires No Hospitalized since last visit: No Transmission-Based Pain Present Now: No Precautions: Patient Has Alerts: Yes Patient Alerts: Patient on Blood Thinner [place] Signature(s) Signed: 12/18/2015 5:39:56 PM By: [name] Entered By: [name] on 12/18/2015 12:40:46 [place]qu[place], [name] ([id number]) -------------------------------------------------------------------------------- Encounter Discharge Information Details Patient Name: Carrie Mulligan C. Date of Service: 12/18/2015 1:30 PM Medical Record Number: [id number] Patient Account Number: [id number] Date of Birth/Sex: 1934-11-24 (80 y.o. Female) Treating RN: [name] Primary Care Physician: [name] Other Clinician: Referring Physician: [name] Treating Physician/Extender: Frann Rider in Treatment: 3 Encounter Discharge Information Items Discharge Pain Level: 0 Discharge Condition:  Stable Ambulatory Status: Ambulatory Discharge Destination: Home Transportation: Private Auto Accompanied By: self Schedule Follow-up Appointment: Yes Medication Reconciliation completed and provided to Patient/Care Yes Ronasia Isola: Provided on Clinical Summary of Care: 12/18/2015 Form Type Recipient Paper Patient [place] Electronic Signature(s) Signed: 12/18/2015 5:39:56 PM By: [name] Previous Signature: 12/18/2015 1:21:21 PM Version By: Ruthine Dose Entered By: [name] on 12/18/2015 13:22:16 [place], [name] ([id number]) -------------------------------------------------------------------------------- Lower Extremity Assessment Details Patient Name: Carrie Cobia, Nailyn C. Date of Service: 12/18/2015 1:30 PM Medical Record Number: [id number] Patient Account Number: [id number] Date of Birth/Sex: 14-Oct-1934 (80 y.o. Female) Treating RN: [name] Primary Care Physician: [name] Other Clinician: Referring Physician: [name] Treating Physician/Extender: Frann Rider in Treatment: 3 Edema Assessment Assessed: [Left: No] [Right: No] E[Left: dema] [Right: :] Calf Left: Right: Point of Measurement: 36 cm From Medial Instep 36.6 cm 35.6 cm Ankle Left: Right: Point of Measurement: 10 cm From Medial Instep 23.4 cm 24.2 cm Vascular Assessment Pulses: Posterior Tibial Dorsalis Pedis Palpable: [Left:Yes] [Right:Yes] Extremity colors, hair growth, and conditions: Extremity Color: [Left:Hyperpigmented] [Right:Hyperpigmented] Temperature of Extremity: [Left:Warm] [Right:Warm] Capillary Refill: [Left:< 3 seconds] [Right:< 3 seconds] Electronic Signature(s) Signed: 12/18/2015 5:39:56 PM By: [name] Entered By: [name] on 12/18/2015 12:46:33 [place], [name] ([id number]) -------------------------------------------------------------------------------- Multi Wound Chart Details Patient Name: Carrie Mulligan C. Date of Service:  12/18/2015 1:30 PM Medical Record Number: [id number] Patient Account Number: [id number] Date of Birth/Sex: 10/02/1934 (80 y.o. Female) Treating RN: [name] Primary Care Physician: [name] Other Clinician: Referring Physician: [name] Treating Physician/Extender: Frann Rider in Treatment: 3 Vital Signs Height(in): 68 Pulse(bpm): 43 Weight(lbs): 178 Blood Pressure 118/58 (mmHg): Body Mass Index(BMI): 27 Temperature(F): Respiratory Rate 18 (breaths/min): Photos: [1:No Photos] [2:No Photos] [3:No Photos] Wound Location: [1:Left Lower Leg - Medial] [2:Right Lower Leg - Lateral Left Lower Leg - Anterior,] [3:Proximal] Wounding Event: [1:Gradually Appeared] [2:Gradually Appeared] [3:Trauma] Primary Etiology: [1:Lymphedema] [2:Lymphedema] [3:Trauma, Other] Comorbid History: [1:Cataracts, Arrhythmia, Hypotension, Osteoarthritis] [2:Cataracts, Arrhythmia, Hypotension, Osteoarthritis] [3:Cataracts, Arrhythmia, Hypotension, Osteoarthritis] Date Acquired: [1:07/04/2015] [2:07/04/2015] [3:12/18/2015] Weeks of Treatment: [1:3] [2:3] [  3:0] Wound Status: [1:Open] [2:Open] [3:Open] Measurements L x W x D 1x0.7x0.3 [2:1.2x1x0.2] [3:3.8x1x0.2] (cm) Area (cm) : [1:0.55] [2:0.942] [3:2.985] Volume (cm) : [1:0.165] [2:0.188] [3:0.597] % Reduction in Area: [1:98.80%] [2:33.40%] [3:N/A] % Reduction in Volume: 98.20% [2:-33.30%] [3:N/A] Classification: [1:Partial Thickness] [2:Partial Thickness] [3:Partial Thickness] Exudate Amount: [1:Large] [2:Large] [3:Large] Exudate Type: [1:Serosanguineous] [2:Serosanguineous] [3:Serosanguineous] Exudate Color: [1:red, brown] [2:red, brown] [3:red, brown] Wound Margin: [1:Distinct, outline attached] [2:Distinct, outline attached] [3:Flat and Intact] Granulation Amount: [1:Medium (34-66%)] [2:Medium (34-66%)] [3:Large (67-100%)] Granulation Quality: [1:Red] [2:Pink, Pale] [3:Red, Pink] Necrotic Amount: [1:Medium (34-66%)] [2:Medium  (34-66%)] [3:None Present (0%)] Exposed Structures: [1:Fascia: No Fat: No Tendon: No Muscle: No Joint: No] [2:Fascia: No Fat: No Tendon: No Muscle: No Joint: No] [3:Fascia: No Fat: No Tendon: No Muscle: No Joint: No] Bone: No Bone: No Bone: No Limited to Skin Limited to Skin Limited to Skin Breakdown Breakdown Breakdown Epithelialization: None None None Periwound Skin Texture: Edema: No Edema: Yes Edema: Yes Excoriation: No Excoriation: No Induration: No Induration: No Callus: No Callus: No Crepitus: No Crepitus: No Fluctuance: No Fluctuance: No Friable: No Friable: No Rash: No Rash: No Scarring: No Scarring: No Periwound Skin Moist: Yes Moist: Yes Moist: Yes Moisture: Maceration: No Maceration: No Dry/Scaly: No Dry/Scaly: No Periwound Skin Color: Atrophie Blanche: No Atrophie Blanche: No No Abnormalities Noted Cyanosis: No Cyanosis: No Ecchymosis: No Ecchymosis: No Erythema: No Erythema: No Hemosiderin Staining: No Hemosiderin Staining: No Mottled: No Mottled: No Pallor: No Pallor: No Rubor: No Rubor: No Temperature: No Abnormality No Abnormality No Abnormality Tenderness on Yes Yes Yes Palpation: Wound Preparation: Ulcer Cleansing: Ulcer Cleansing: Ulcer Cleansing: Rinsed/Irrigated with Rinsed/Irrigated with Rinsed/Irrigated with Saline Saline Saline Topical Anesthetic Topical Anesthetic Topical Anesthetic Applied: Other: lidocaine Applied: Other: lidocaine Applied: Other: lidocaine 4% 4% 4% Treatment Notes Electronic Signature(s) Signed: 12/18/2015 5:39:56 PM By: [name] Entered By: [name] on 12/18/2015 12:55:38 [place], [name] ([id number]) -------------------------------------------------------------------------------- [place] Details Patient Name: [name] Date of Service: 12/18/2015 1:30 PM Medical Record Number: [id number] Patient Account Number: [id number] Date of Birth/Sex: Jul 12, 1934  (80 y.o. Female) Treating RN: Carolyne Fiscal, Debi Primary Care Physician: [name] Other Clinician: Referring Physician: [name] Treating Physician/Extender: Frann Rider in Treatment: 3 Active Inactive Orientation to the Wound Care Program Nursing Diagnoses: Knowledge deficit related to the wound healing center program Goals: Patient/caregiver will verbalize understanding of the [place] Program Date Initiated: 11/22/2015 Goal Status: Active Interventions: Provide education on orientation to the wound center Notes: Venous Leg Ulcer Nursing Diagnoses: Knowledge deficit related to disease process and management Potential for venous Insuffiency (use before diagnosis confirmed) Goals: Patient will maintain optimal edema control Date Initiated: 11/22/2015 Goal Status: Active Patient/caregiver will verbalize understanding of disease process and disease management Date Initiated: 11/22/2015 Goal Status: Active Verify adequate tissue perfusion prior to therapeutic compression application Date Initiated: 11/22/2015 Goal Status: Active Interventions: Assess peripheral edema status every visit. Compression as ordered Provide education on venous insufficiency MALAYAH, DUSCH ([id number]) Treatment Activities: Non-invasive vascular studies : 11/22/2015 Therapeutic compression applied : 11/22/2015 Notes: Wound/Skin Impairment Nursing Diagnoses: Impaired tissue integrity Knowledge deficit related to ulceration/compromised skin integrity Goals: Patient/caregiver will verbalize understanding of skin care regimen Date Initiated: 11/22/2015 Goal Status: Active Ulcer/skin breakdown will have a volume reduction of 30% by week 4 Date Initiated: 11/22/2015 Goal Status: Active Ulcer/skin breakdown will have a volume reduction of 50% by week 8 Date Initiated: 11/22/2015 Goal Status: Active Ulcer/skin breakdown will have a volume reduction of 80% by  week 12 Date  Initiated: 11/22/2015 Goal Status: Active Ulcer/skin breakdown will heal within 14 weeks Date Initiated: 11/22/2015 Goal Status: Active Interventions: Assess patient/caregiver ability to perform ulcer/skin care regimen upon admission and as needed Assess ulceration(s) every visit Provide education on ulcer and skin care Treatment Activities: Skin care regimen initiated : 11/22/2015 Topical wound management initiated : 11/22/2015 Notes: Electronic Signature(s) Signed: 12/18/2015 5:39:56 PM By: [name] Entered By: [name] on 12/18/2015 12:55:32 [place], [name] ([id number]) Cunanan, Lometa. ([id number]) -------------------------------------------------------------------------------- Pain Assessment Details Patient Name: Carrie Mulligan C. Date of Service: 12/18/2015 1:30 PM Medical Record Number: [id number] Patient Account Number: [id number] Date of Birth/Sex: [birth year] (80 y.o. Female) Treating RN: [name] Primary Care Physician: [name] Other Clinician: Referring Physician: [name] Treating Physician/Extender: Frann Rider in Treatment: 3 Active Problems Location of Pain Severity and Description of Pain Patient Has Paino No Site Locations Pain Management and Medication Current Pain Management: Electronic Signature(s) Signed: 12/18/2015 5:39:56 PM By: [name] Entered By: [name] on 12/18/2015 12:40:51 [place], [name] ([id number]) -------------------------------------------------------------------------------- Patient/Caregiver Education Details Patient Name: [name] Date of Service: 12/18/2015 1:30 PM Medical Record Number: [id number] Patient Account Number: [id number] Date of Birth/Gender: 1934/11/05 (80 y.o. Female) Treating RN: [name] Primary Care Physician: [name] Other Clinician: Referring Physician: [name] Treating Physician/Extender: Frann Rider in  Treatment: 3 Education Assessment Education Provided To: Patient Education Topics Provided Wound/Skin Impairment: Handouts: Other: do not get wraps wet Methods: Demonstration, Explain/Verbal Responses: State content correctly Electronic Signature(s) Signed: 12/18/2015 5:39:56 PM By: [name] Entered By: [name] on 12/18/2015 13:22:31 Dibiasio, [name] ([id number]) -------------------------------------------------------------------------------- Wound Assessment Details Patient Name: Carrie Cobia, Shamarie C. Date of Service: 12/18/2015 1:30 PM Medical Record Number: [id number] Patient Account Number: [id number] Date of Birth/Sex: [birth year] (80 y.o. Female) Treating RN: Carolyne Fiscal, Debi Primary Care Physician: [name] Other Clinician: Referring Physician: [name] Treating Physician/Extender: Frann Rider in Treatment: 3 Wound Status Wound Number: 1 Primary Lymphedema Etiology: Wound Location: Left Lower Leg - Medial Wound Status: Open Wounding Event: Gradually Appeared Comorbid Cataracts, Arrhythmia, Hypotension, Date Acquired: 07/04/2015 History: Osteoarthritis Weeks Of Treatment: 3 Clustered Wound: No Photos Photo Uploaded By: [name] on 12/18/2015 16:56:43 Wound Measurements Length: (cm) 1 Width: (cm) 0.7 Depth: (cm) 0.3 Area: (cm) 0.55 Volume: (cm) 0.165 % Reduction in Area: 98.8% % Reduction in Volume: 98.2% Epithelialization: None Tunneling: No Undermining: No Wound Description Classification: Partial Thickness Wound Margin: Distinct, outline attached Exudate Amount: Large Exudate Type: Serosanguineous Exudate Color: red, brown Foul Odor After Cleansing: No Wound Bed Granulation Amount: Medium (34-66%) Exposed Structure Granulation Quality: Red Fascia Exposed: No Necrotic Amount: Medium (34-66%) Fat Layer Exposed: No Necrotic Quality: Adherent Slough Tendon Exposed: No Stcyr, Matika C. ([id number]) Muscle  Exposed: No Joint Exposed: No Bone Exposed: No Limited to Skin Breakdown Periwound Skin Texture Texture Color No Abnormalities Noted: No No Abnormalities Noted: No Callus: No Atrophie Blanche: No Crepitus: No Cyanosis: No Excoriation: No Ecchymosis: No Fluctuance: No Erythema: No Friable: No Hemosiderin Staining: No Induration: No Mottled: No Localized Edema: No Pallor: No Rash: No Rubor: No Scarring: No Temperature / Pain Moisture Temperature: No Abnormality No Abnormalities Noted: No Tenderness on Palpation: Yes Dry / Scaly: No Maceration: No Moist: Yes Wound Preparation Ulcer Cleansing: Rinsed/Irrigated with Saline Topical Anesthetic Applied: Other: lidocaine 4%, Treatment Notes Wound #1 (Left, Medial Lower Leg) 1. Cleansed with: Cleanse wound with antibacterial soap and water 2. Anesthetic Topical Lidocaine 4% cream to wound bed prior  to debridement 4. Dressing Applied: Other dressing (specify in notes) 5. Secondary Dressing Applied ABD Pad 7. Secured with Tape 3 Layer Compression System - Bilateral Notes RTD Electronic Signature(s) Signed: 12/18/2015 5:39:56 PM By: [name] Entered By: [name] on 12/18/2015 12:49:44 Urbas, RAELYNN BLOT ([id number]) [place], [place]. ([id number]) -------------------------------------------------------------------------------- Wound Assessment Details Patient Name: Carrie Cobia, Joni C. Date of Service: 12/18/2015 1:30 PM Medical Record Number: [id number] Patient Account Number: [id number] Date of Birth/Sex: 1935/05/13 (80 y.o. Female) Treating RN: Carolyne Fiscal, Debi Primary Care Physician: [name] Other Clinician: Referring Physician: [name] Treating Physician/Extender: Frann Rider in Treatment: 3 Wound Status Wound Number: 2 Primary Lymphedema Etiology: Wound Location: Right Lower Leg - Lateral Wound Status: Open Wounding Event: Gradually Appeared Comorbid Cataracts,  Arrhythmia, Hypotension, Date Acquired: 07/04/2015 History: Osteoarthritis Weeks Of Treatment: 3 Clustered Wound: No Photos Photo Uploaded By: [name] on 12/18/2015 16:56:55 Wound Measurements Length: (cm) 1.2 Width: (cm) 1 Depth: (cm) 0.2 Area: (cm) 0.942 Volume: (cm) 0.188 % Reduction in Area: 33.4% % Reduction in Volume: -33.3% Epithelialization: None Tunneling: No Undermining: No Wound Description Classification: Partial Thickness Wound Margin: Distinct, outline attached Exudate Amount: Large Exudate Type: Serosanguineous Exudate Color: red, brown Foul Odor After Cleansing: No Wound Bed Granulation Amount: Medium (34-66%) Exposed Structure Granulation Quality: Pink, Pale Fascia Exposed: No Necrotic Amount: Medium (34-66%) Fat Layer Exposed: No Necrotic Quality: Adherent Slough Tendon Exposed: No Moralez, Audreyanna C. ([id number]) Muscle Exposed: No Joint Exposed: No Bone Exposed: No Limited to Skin Breakdown Periwound Skin Texture Texture Color No Abnormalities Noted: No No Abnormalities Noted: No Callus: No Atrophie Blanche: No Crepitus: No Cyanosis: No Excoriation: No Ecchymosis: No Fluctuance: No Erythema: No Friable: No Hemosiderin Staining: No Induration: No Mottled: No Localized Edema: Yes Pallor: No Rash: No Rubor: No Scarring: No Temperature / Pain Moisture Temperature: No Abnormality No Abnormalities Noted: No Tenderness on Palpation: Yes Dry / Scaly: No Maceration: No Moist: Yes Wound Preparation Ulcer Cleansing: Rinsed/Irrigated with Saline Topical Anesthetic Applied: Other: lidocaine 4%, Treatment Notes Wound #2 (Right, Lateral Lower Leg) 1. Cleansed with: Cleanse wound with antibacterial soap and water 2. Anesthetic Topical Lidocaine 4% cream to wound bed prior to debridement 4. Dressing Applied: Other dressing (specify in notes) 5. Secondary Dressing Applied ABD Pad 7. Secured with Tape 3 Layer Compression  System - Bilateral Notes RTD Electronic Signature(s) Signed: 12/18/2015 5:39:56 PM By: [name] Entered By: [name] on 12/18/2015 12:51:02 Hoeschen, SERENITI HOUCHENS ([id number]) Noa, Charco. ([id number]) -------------------------------------------------------------------------------- Wound Assessment Details Patient Name: Carrie Cobia, Lavonn C. Date of Service: 12/18/2015 1:30 PM Medical Record Number: [id number] Patient Account Number: [id number] Date of Birth/Sex: 1934/08/02 (80 y.o. Female) Treating RN: Carolyne Fiscal, Debi Primary Care Physician: [name] Other Clinician: Referring Physician: [name] Treating Physician/Extender: Frann Rider in Treatment: 3 Wound Status Wound Number: 3 Primary Trauma, Other Etiology: Wound Location: Left Lower Leg - Anterior, Proximal Wound Status: Open Wounding Event: Trauma Comorbid Cataracts, Arrhythmia, Hypotension, History: Osteoarthritis Date Acquired: 12/18/2015 Weeks Of Treatment: 0 Clustered Wound: No Photos Photo Uploaded By: [name] on 12/18/2015 16:56:56 Wound Measurements Length: (cm) 3.8 % Reduction in Width: (cm) 1 % Reduction in Depth: (cm) 0.2 Epithelializati Area: (cm) 2.985 Tunneling: Volume: (cm) 0.597 Undermining: Area: Volume: on: None No No Wound Description Classification: Partial Thickness Wound Margin: Flat and Intact Exudate Amount: Large Exudate Type: Serosanguineous Exudate Color: red, brown Foul Odor After Cleansing: No Wound Bed Granulation Amount: Large (67-100%) Exposed Structure Granulation Quality: Red, Pink Fascia Exposed: No Necrotic Amount:  None Present (0%) Fat Layer Exposed: No Boccio, Eldoris C. ([id number]) Tendon Exposed: No Muscle Exposed: No Joint Exposed: No Bone Exposed: No Limited to Skin Breakdown Periwound Skin Texture Texture Color No Abnormalities Noted: No No Abnormalities Noted: No Localized Edema: Yes Temperature /  Pain Moisture Temperature: No Abnormality No Abnormalities Noted: No Tenderness on Palpation: Yes Moist: Yes Wound Preparation Ulcer Cleansing: Rinsed/Irrigated with Saline Topical Anesthetic Applied: Other: lidocaine 4%, Treatment Notes Wound #3 (Left, Proximal, Anterior Lower Leg) 1. Cleansed with: Cleanse wound with antibacterial soap and water 2. Anesthetic Topical Lidocaine 4% cream to wound bed prior to debridement 4. Dressing Applied: Other dressing (specify in notes) 5. Secondary Dressing Applied Dry Gauze 7. Secured with Tape 3 Layer Compression System - Bilateral Notes steri-strips Electronic Signature(s) Signed: 12/18/2015 5:39:56 PM By: [name] Entered By: [name] on 12/18/2015 12:48:06 [place], [name] ([id number]) -------------------------------------------------------------------------------- Vitals Details Patient Name: Carrie Mulligan C. Date of Service: 12/18/2015 1:30 PM Medical Record Number: [id number] Patient Account Number: [id number] Date of Birth/Sex: [birth year] (80 y.o. Female) Treating RN: Carolyne Fiscal, Debi Primary Care Physician: [name] Other Clinician: Referring Physician: [name] Treating Physician/Extender: Frann Rider in Treatment: 3 Vital Signs Time Taken: 12:40 Pulse (bpm): 43 Height (in): 68 Respiratory Rate (breaths/min): 18 Weight (lbs): 178 Blood Pressure (mmHg): 118/58 Body Mass Index (BMI): 27.1 Reference Range: 80 - 120 mg / dl Electronic Signature(s) Signed: 12/18/2015 5:39:56 PM By: [name] Entered By: [name] on 12/18/2015 12:45:46

## 2015-12-20 ENCOUNTER — Encounter: Payer: Self-pay | Admitting: *Deleted

## 2015-12-20 ENCOUNTER — Ambulatory Visit: Payer: Medicare Other

## 2015-12-20 DIAGNOSIS — I5042 Chronic combined systolic (congestive) and diastolic (congestive) heart failure: Secondary | ICD-10-CM

## 2015-12-20 DIAGNOSIS — Z952 Presence of prosthetic heart valve: Secondary | ICD-10-CM

## 2015-12-20 NOTE — Progress Notes (Signed)
Cardiac Individual Treatment Plan  Patient Details  Name: [name] MRN: [id number] Date of Birth: [birth year] Referring Provider:    Initial Encounter Date:       Cardiac Rehab from 09/25/2015 in [facility] Cardiac and Pulmonary Rehab   Date  09/25/15      Visit Diagnosis: S/P MVR (mitral valve replacement)  Chronic combined systolic and diastolic congestive heart failure ([place])  Patient's Home Medications on Admission:  Current outpatient prescriptions:  .  acetaminophen (TYLENOL) 500 MG tablet, Take 500 mg by mouth every 6 (six) hours as needed for mild pain, moderate pain, fever or headache., Disp: , Rfl:  .  amiodarone (PACERONE) 200 MG tablet, Take 200 mg by mouth daily., Disp: , Rfl:  .  aspirin EC 81 MG tablet, Take 81 mg by mouth daily. , Disp: , Rfl:  .  furosemide (LASIX) 40 MG tablet, Take 60 mg by mouth daily., Disp: , Rfl:  .  gabapentin (NEURONTIN) 100 MG capsule, Take 1 capsule (100 mg total) by mouth at bedtime., Disp: 90 capsule, Rfl: 0 .  metoprolol succinate (TOPROL-XL) 25 MG 24 hr tablet, Take 1 tablet (25 mg total) by mouth daily., Disp: 30 tablet, Rfl: 2 .  Multiple Vitamin (MULTI-VITAMINS) TABS, Take 1 tablet by mouth daily. , Disp: , Rfl:  .  pantoprazole (PROTONIX) 40 MG tablet, Take 40 mg by mouth daily. , Disp: , Rfl:  .  potassium chloride (K-DUR) 10 MEQ tablet, Take by mouth., Disp: , Rfl:  .  spironolactone (ALDACTONE) 25 MG tablet, Take 25 mg by mouth daily., Disp: , Rfl:  .  traMADol (ULTRAM) 50 MG tablet, TAKE 1/2 TO 1 TABLET BY MOUTH TWICE A DAY AS NEEDED FOR PAIN, Disp: , Rfl: 5 .  vitamin C (ASCORBIC ACID) 500 MG tablet, Take 500 mg by mouth daily as needed (for cold symptoms). , Disp: , Rfl:  .  warfarin (COUMADIN) 5 MG tablet, Take 1 tablet (5 mg total) by mouth daily., Disp: 30 tablet, Rfl: 2  Past Medical History: Past Medical History  Diagnosis Date  . Shortness of breath dyspnea   . Hypertension   . Chronic combined systolic  (congestive) and diastolic (congestive) heart failure (HCC)     a. EF 25% by echo in 08/2015 b. RHC in 08/2015 showed normal filling pressures  . GERD (gastroesophageal reflux disease)   . Arthritis   . Chronic atrial fibrillation ([place])     a. On Coumadin  . S/P MVR (mitral valve replacement)     a. MVR 1994 b. redo MVR in 08/2014 - on Coumadin    Tobacco Use: History  Smoking status  . Former Smoker  Smokeless tobacco  . Never Used    Labs: Recent Review Flowsheet Data    There is no flowsheet data to display.       Exercise Target Goals:    Exercise Program Goal: Individual exercise prescription set with THRR, safety & activity barriers. Participant demonstrates ability to understand and report RPE using BORG scale, to self-measure pulse accurately, and to acknowledge the importance of the exercise prescription.  Exercise Prescription Goal: Starting with aerobic activity 30 plus minutes a day, 3 days per week for initial exercise prescription. Provide home exercise prescription and guidelines that participant acknowledges understanding prior to discharge.  Activity Barriers & Risk Stratification:     Activity Barriers & Cardiac Risk Stratification - 09/25/15 1531    Activity Barriers & Cardiac Risk Stratification   Activity Barriers Arthritis;Joint Problems  Cardiac Risk Stratification High      6 Minute Walk:     6 Minute Walk      09/25/15 1509       6 Minute Walk   Phase Initial  NuStep Test      Distance 413 feet  Nu Step Test Level 1 with arms     Walk Time 6 minutes     # of Rest Breaks 0     RPE 13     Symptoms No     Resting HR 111 bpm     Resting BP 132/60 mmHg     Max Ex. HR 116 bpm     Max Ex. BP 138/54 mmHg        Initial Exercise Prescription:     Initial Exercise Prescription - 09/25/15 1500    Date of Initial Exercise RX and Referring Provider   Date 09/25/15   Treadmill   MPH 1  Use caution with treadmill and small time  intervals   Grade 0   Minutes 3   Recumbant Bike   Level 2   RPM 40   Watts 15   Minutes 10   NuStep   Level 2   Watts 20   Minutes 15   Arm Ergometer   Level 1   Watts 10   Minutes 15   Arm/Foot Ergometer   Level 4   Watts 12   Minutes 15   Recumbant Elliptical   Level 1   RPM 35   Watts 10   Minutes 10   REL-XR   Level 2   Watts 30   Minutes 15   T5 Nustep   Level 2   Watts 15   Minutes 15   Biostep-RELP   Level 2   Watts 15   Minutes 15   Prescription Details   Frequency (times per week) 3   Duration Progress to 30 minutes of continuous aerobic without signs/symptoms of physical distress   Intensity   THRR REST +  30   Ratings of Perceived Exertion 11-15   Progression   Progression Continue progressive overload as per policy without signs/symptoms or physical distress.   Resistance Training   Training Prescription Yes   Weight 2   Reps 10-15      Perform Capillary Blood Glucose checks as needed.  Exercise Prescription Changes:   Exercise Comments:     Exercise Comments      10/20/15 1158 11/16/15 1540         Exercise Comments [name] has not attended Heart Track since 09-27-15. Goal will be to resume program when able. [name] has not attended since 3-29.         Discharge Exercise Prescription (Final Exercise Prescription Changes):   Nutrition:  Target Goals: Understanding of nutrition guidelines, daily intake of sodium 1500mg , cholesterol 200mg , calories 30% from fat and 7% or less from saturated fats, daily to have 5 or more servings of fruits and vegetables.  Biometrics:     Pre Biometrics - 09/25/15 1508    Pre Biometrics   Height 5' 5.5" (1.664 m)   Weight 186 lb 1.6 oz (84.414 kg)   Waist Circumference 34 inches   Hip Circumference 42 inches   Waist to Hip Ratio 0.81 %   BMI (Calculated) 30.6       Nutrition Therapy Plan and Nutrition Goals:     Nutrition Therapy & Goals - 10/04/15 1707    Personal Nutrition  Goals  Comments [name] prefers not to meet with RD       Nutrition Discharge: Rate Your Plate Scores:     Nutrition Assessments - 09/28/15 1421    Rate Your Plate Scores   Pre Score 65   Pre Score % 72 %      Nutrition Goals Re-Evaluation:   Psychosocial: Target Goals: Acknowledge presence or absence of depression, maximize coping skills, provide positive support system. Participant is able to verbalize types and ability to use techniques and skills needed for reducing stress and depression.  Initial Review & Psychosocial Screening:     Initial Psych Review & Screening - 09/25/15 [place]? Yes      Quality of Life Scores:     Quality of Life - 09/25/15 1541    Quality of Life Scores   Health/Function Pre 19.5 %   Socioeconomic Pre 26.75 %   Psych/Spiritual Pre 20.64 %   Family Pre 24.88 %   GLOBAL Pre 21.59 %      PHQ-9:     Recent Review Flowsheet Data    Depression screen [facility] 2/9 09/25/2015 01/05/2015   Decreased Interest 0 0   Down, Depressed, Hopeless 0 0   PHQ - 2 Score 0 0   Altered sleeping 3 1   Tired, decreased energy 3 1   Change in appetite 0 0   Feeling bad or failure about yourself  0 0   Trouble concentrating 0 0   Moving slowly or fidgety/restless 0 0   Suicidal thoughts 0 0   PHQ-9 Score 6 2   Difficult doing work/chores Somewhat difficult -      Psychosocial Evaluation and Intervention:   Psychosocial Re-Evaluation:     Psychosocial Re-Evaluation      09/28/15 1422 12/11/15 1323         Psychosocial Re-Evaluation   Comments When I checked EPIC to chart Rate your plate, I noticed [name] is in the [facility] today.  [name]'s son called to say [name] fell and broker her tail bone and is very sore. I called and spoke to [name] and  she said she is sorry she can't attend Cardiac Rehab. but she is sore and on pain medicine. I tried to call her son back at [phone]  but his vm was full.       Continued Psychosocial Services Needed Yes          Vocational Rehabilitation: Provide vocational rehab assistance to qualifying candidates.   Vocational Rehab Evaluation & Intervention:     Vocational Rehab - 09/25/15 1532    Initial Vocational Rehab Evaluation & Intervention   Assessment shows need for Vocational Rehabilitation No      Education: Education Goals: Education classes will be provided on a weekly basis, covering required topics. Participant will state understanding/return demonstration of topics presented.  Learning Barriers/Preferences:   Education Topics: General Nutrition Guidelines/Fats and Fiber: -Group instruction provided by verbal, written material, models and posters to present the general guidelines for heart healthy nutrition. Gives an explanation and review of dietary fats and fiber.   Controlling Sodium/Reading Food Labels: -Group verbal and written material supporting the discussion of sodium use in heart healthy nutrition. Review and explanation with models, verbal and written materials for utilization of the food label.          Cardiac Rehab from 09/25/2015 in [facility] Cardiac and Pulmonary Rehab   Date  12/26/14  Educator  CR   Instruction Review Code  2- meets goals/outcomes      Exercise Physiology & Risk Factors: - Group verbal and written instruction with models to review the exercise physiology of the cardiovascular system and associated critical values. Details cardiovascular disease risk factors and the goals associated with each risk factor.      Cardiac Rehab from 09/25/2015 in [facility] Cardiac and Pulmonary Rehab   Date  11/16/14   Educator  RM   Instruction Review Code  2- meets goals/outcomes      Aerobic Exercise & Resistance Training: - Gives group verbal and written discussion on the health impact of inactivity. On the components of aerobic and resistive training programs and the benefits of this  training and how to safely progress through these programs.      Cardiac Rehab from 09/25/2015 in [facility] Cardiac and Pulmonary Rehab   Date  11/23/14   Educator  RM   Instruction Review Code  2- meets goals/outcomes      Flexibility, Balance, General Exercise Guidelines: - Provides group verbal and written instruction on the benefits of flexibility and balance training programs. Provides general exercise guidelines with specific guidelines to those with heart or lung disease. Demonstration and skill practice provided.      Cardiac Rehab from 09/25/2015 in [facility] Cardiac and Pulmonary Rehab   Date  11/21/14   Educator  SB   Instruction Review Code  2- meets goals/outcomes      Stress Management: - Provides group verbal and written instruction about the health risks of elevated stress, cause of high stress, and healthy ways to reduce stress.      Cardiac Rehab from 09/25/2015 in [facility] Cardiac and Pulmonary Rehab   Date  11/30/14   Educator  [facility]   Instruction Review Code  2- meets goals/outcomes      Depression: - Provides group verbal and written instruction on the correlation between heart/lung disease and depressed mood, treatment options, and the stigmas associated with seeking treatment.      Cardiac Rehab from 09/25/2015 in [facility] Cardiac and Pulmonary Rehab   Date  11/09/14   Educator  [facility]   Instruction Review Code  2- meets goals/outcomes      Anatomy & Physiology of the Heart: - Group verbal and written instruction and models provide basic cardiac anatomy and physiology, with the coronary electrical and arterial systems. Review of: AMI, Angina, Valve disease, Heart Failure, Cardiac Arrhythmia, Pacemakers, and the ICD.      Cardiac Rehab from 09/25/2015 in [facility] Cardiac and Pulmonary Rehab   Date  12/05/14   Educator  SB   Instruction Review Code  2- meets goals/outcomes      Cardiac Procedures: - Group verbal and written instruction and models to describe the testing methods done  to diagnose heart disease. Reviews the outcomes of the test results. Describes the treatment choices: Medical Management, Angioplasty, or Coronary Bypass Surgery.      Cardiac Rehab from 09/25/2015 in [facility] Cardiac and Pulmonary Rehab   Date  11/07/14   Educator  Foster Simpson RN BSN CCRP   Instruction Review Code  2- meets goals/outcomes      Cardiac Medications: - Group verbal and written instruction to review commonly prescribed medications for heart disease. Reviews the medication, class of the drug, and side effects. Includes the steps to properly store meds and maintain the prescription regimen.      Cardiac Rehab from 09/25/2015 in [facility] Cardiac and Pulmonary Rehab  Date  11/14/14   Educator  Foster Simpson RN CCRP   Instruction Review Code  2- meets goals/outcomes      Go Sex-Intimacy & Heart Disease, Get SMART - Goal Setting: - Group verbal and written instruction through game format to discuss heart disease and the return to sexual intimacy. Provides group verbal and written material to discuss and apply goal setting through the application of the S.M.A.R.T. Method.      Cardiac Rehab from 09/25/2015 in [facility] Cardiac and Pulmonary Rehab   Date  11/07/14   Educator  Foster Simpson RN BSN CCRP   Instruction Review Code  2- meets goals/outcomes      Other Matters of the Heart: - Provides group verbal, written materials and models to describe Heart Failure, Angina, Valve Disease, and Diabetes in the realm of heart disease. Includes description of the disease process and treatment options available to the cardiac patient.      Cardiac Rehab from 09/25/2015 in [facility] Cardiac and Pulmonary Rehab   Date  12/14/14   Educator  CE   Instruction Review Code  2- meets goals/outcomes      Exercise & Equipment Safety: - Individual verbal instruction and demonstration of equipment use and safety with use of the equipment.      Cardiac Rehab from 09/25/2015 in [facility] Cardiac and Pulmonary Rehab   Date  09/25/15    Educator  C. EnterkinRN   Instruction Review Code  1- partially meets, needs review/practice      Infection Prevention: - Provides verbal and written material to individual with discussion of infection control including proper hand washing and proper equipment cleaning during exercise session.      Cardiac Rehab from 09/25/2015 in [facility] Cardiac and Pulmonary Rehab   Date  09/25/15   Educator  C. EnterkinRN   Instruction Review Code  2- meets goals/outcomes      Falls Prevention: - Provides verbal and written material to individual with discussion of falls prevention and safety.      Cardiac Rehab from 09/25/2015 in [facility] Cardiac and Pulmonary Rehab   Date  09/25/15   Educator  C. [place]   Instruction Review Code  1- partially meets, needs review/practice      Diabetes: - Individual verbal and written instruction to review signs/symptoms of diabetes, desired ranges of glucose level fasting, after meals and with exercise. Advice that pre and post exercise glucose checks will be done for 3 sessions at entry of program.    Knowledge Questionnaire Score:     Knowledge Questionnaire Score - 09/25/15 1532    Knowledge Questionnaire Score   Pre Score 18      Core Components/Risk Factors/Patient Goals at Admission:     Personal Goals and Risk Factors at Admission - 09/25/15 1536    Core Components/Risk Factors/Patient Goals on Admission   Increase Strength and Stamina Yes   Intervention Provide advice, education, support and counseling about physical activity/exercise needs.;Develop an individualized exercise prescription for aerobic and resistive training based on initial evaluation findings, risk stratification, comorbidities and participant's personal goals.   Expected Outcomes Achievement of increased cardiorespiratory fitness and enhanced flexibility, muscular endurance and strength shown through measurements of functional capacity and personal statement of participant.    Heart Failure Yes   Intervention Provide a combined exercise and nutrition program that is supplemented with education, support and counseling about heart failure. Directed toward relieving symptoms such as shortness of breath, decreased exercise tolerance, and extremity edema.   Expected  Outcomes Improve functional capacity of life;Short term: Attendance in program 2-3 days a week with increased exercise capacity. Reported lower sodium intake. Reported increased fruit and vegetable intake. Reports medication compliance.;Short term: Daily weights obtained and reported for increase. Utilizing diuretic protocols set by physician.;Long term: Adoption of self-care skills and reduction of barriers for early signs and symptoms recognition and intervention leading to self-care maintenance.      Core Components/Risk Factors/Patient Goals Review:      Goals and Risk Factor Review      10/18/15 1900 12/11/15 1322         Core Components/Risk Factors/Patient Goals Review   Personal Goals Review  Sedentary      Review Was in  hospital Yacine's son called to say [name] fell and broker her tail bone and is very sore. She said she is sorry she can't attend Cardiac Rehab. but she is sore and on pain medicine. I tried to call her son back at [phone] but his vm was full.       Expected Outcomes  Able to return to Cardiac Rehab eventually.          Core Components/Risk Factors/Patient Goals at Discharge (Final Review):      Goals and Risk Factor Review - 12/11/15 1322    Core Components/Risk Factors/Patient Goals Review   Personal Goals Review Sedentary   Review [name]'s son called to say [name] fell and broker her tail bone and is very sore. She said she is sorry she can't attend Cardiac Rehab. but she is sore and on pain medicine. I tried to call her son back at [phone] but his vm was full.    Expected Outcomes Able to return to Cardiac Rehab eventually.       ITP Comments:      ITP Comments      09/26/15 0825 09/28/15 1422 10/22/15 1012 11/19/15 1112 12/11/15 1318   ITP Comments 30 day review   Continue with ITP    New start to program has attended medical review orientation. When I checked EPIC to chart Rate your plate, I noticed [name] is in the [facility] Emerg Dept. 30 Day review. Continue with ITP  Last visit March 29,2017 30 day review. Continue with ITP. Remains out after hospital admit for HF.  Waiting for MD clearance. [name]'s son called to say [name] fell and broker her tail bone and is very sore. She said she is sorry she can't attend Cardiac Rehab. but she is sore and on pain medicine. I tried to call her son back at [phone] but his vm was full.      12/14/15 1438 12/20/15 0908         ITP Comments patient has not returned to cardiac rehab - last session 09/27/15 30 day review.  Continue with ITP   Remains out, expected to return since MD has released her for exercise         Comments:

## 2015-12-21 ENCOUNTER — Ambulatory Visit: Payer: Medicare Other

## 2015-12-25 ENCOUNTER — Ambulatory Visit: Payer: Medicare Other

## 2015-12-25 DIAGNOSIS — Z9889 Other specified postprocedural states: Secondary | ICD-10-CM

## 2015-12-25 DIAGNOSIS — I5042 Chronic combined systolic (congestive) and diastolic (congestive) heart failure: Secondary | ICD-10-CM | POA: Diagnosis not present

## 2015-12-25 DIAGNOSIS — Z952 Presence of prosthetic heart valve: Secondary | ICD-10-CM

## 2015-12-25 NOTE — Progress Notes (Signed)
Daily Session Note  Patient Details  Name: DELICIA BERENS MRN: [id number] Date of Birth: 1934-11-05 Referring Provider:    Encounter Date: 12/25/2015  Check In:     Session Check In - 12/25/15 1635    Check-In   Location [facility]-Cardiac & Pulmonary Rehab   Staff Present Earlean Shawl, BS, ACSM CEP, Exercise Physiologist;Amanda Oletta Darter, BA, ACSM CEP, Exercise Physiologist;Diane Joya Gaskins, RN, BSN   Supervising physician immediately available to respond to emergencies See telemetry face sheet for immediately available ER MD   Medication changes reported     No   Fall or balance concerns reported    Yes   Comments Ms Duvall reports a fall 3 weeks ago   Warm-up and Cool-down Performed on first and last piece of equipment   Resistance Training Performed Yes   VAD Patient? No   Pain Assessment   Currently in Pain? No/denies   Multiple Pain Sites No         Goals Met:  Exercise tolerated well No report of cardiac concerns or symptoms Strength training completed today  Goals Unmet:  Not Applicable  Comments: Ms Donofrio is returning today after absence   Dr. [name] is Medical Director for [place] and [facility].

## 2015-12-26 ENCOUNTER — Encounter: Payer: Medicare Other | Admitting: Internal Medicine

## 2015-12-26 DIAGNOSIS — I5042 Chronic combined systolic (congestive) and diastolic (congestive) heart failure: Secondary | ICD-10-CM | POA: Diagnosis not present

## 2015-12-27 ENCOUNTER — Encounter: Payer: Medicare Other | Admitting: *Deleted

## 2015-12-27 ENCOUNTER — Ambulatory Visit: Payer: Medicare Other

## 2015-12-27 DIAGNOSIS — I5042 Chronic combined systolic (congestive) and diastolic (congestive) heart failure: Secondary | ICD-10-CM

## 2015-12-27 NOTE — Progress Notes (Signed)
Daily Session Note  Patient Details  Name: [name] MRN: [id number] Date of Birth: [birth year] Referring Provider:    Encounter Date: 12/27/2015  Check In:     Session Check In - 12/27/15 1620    Check-In   Location [facility]-Cardiac & Pulmonary Rehab   Staff Present Heath Lark, RN, BSN, Lance Sell, BA, ACSM CEP, Exercise Physiologist;Diane Joya Gaskins, RN, BSN   Supervising physician immediately available to respond to emergencies See telemetry face sheet for immediately available ER MD   Medication changes reported     No   Fall or balance concerns reported    No   Warm-up and Cool-down Performed on first and last piece of equipment   Resistance Training Performed Yes   VAD Patient? No   Pain Assessment   Currently in Pain? No/denies         Goals Met:  Exercise tolerated well No report of cardiac concerns or symptoms Strength training completed today  Goals Unmet:  Not Applicable  Comments:  Pt able to follow exercise prescription today without complaint.  Will continue to monitor for progression.    Dr. [name] is Medical Director for [place] and [facility].

## 2015-12-27 NOTE — Progress Notes (Addendum)
LEXXY, DEROY ([id number]) Visit Report for 12/26/2015 Arrival Information Details Patient Name: [name], [name]. Date of Service: 12/26/2015 1:30 PM Medical Record Patient Account Number: [id number] Number: Treating RN: [name] 1935-01-30 (80 y.o. Other Clinician: Date of Birth/Sex: Female) Treating [name], [name] Primary Care Physician/Extender: Claudette Laws Physician: Referring Physician: [name] in Treatment: 4 Visit Information History Since Last Visit All ordered tests and consults were completed: No Patient Arrived: Kasandra Knudsen Added or deleted any medications: No Arrival Time: 13:39 Any new allergies or adverse reactions: No Accompanied By: self Had a fall or experienced change in No Transfer Assistance: None activities of daily living that may affect Patient Identification Verified: Yes risk of falls: Secondary Verification Yes Signs or symptoms of abuse/neglect since last No Process Completed: visito Patient Requires No Hospitalized since last visit: No Transmission-Based Pain Present Now: No Precautions: Patient Has Alerts: Yes Patient Alerts: Patient on Blood Thinner [place] Signature(s) Signed: 12/26/2015 5:01:48 PM By: [name] Entered By: [name] on 12/26/2015 13:40:45 Hoeppner, [name] ([id number]) -------------------------------------------------------------------------------- Encounter Discharge Information Details Patient Name: Carrie Mulligan C. Date of Service: 12/26/2015 1:30 PM Medical Record Patient Account Number: [id number] Number: Treating RN: [name] [birth year] (80 y.o. Other Clinician: Date of Birth/Sex: Female) Treating [name], [name] Primary Care Physician/Extender: Claudette Laws Physician: Referring Physician: [name] in Treatment: 4 Encounter Discharge Information Items Discharge Pain Level: 0 Discharge  Condition: Stable Ambulatory Status: Cane Discharge Destination: Home Transportation: Private Auto Accompanied By: self Schedule Follow-up Appointment: Yes Medication Reconciliation completed Yes and provided to Patient/Care [name]: Provided on Clinical Summary of Care: 12/26/2015 Form Type Recipient Paper Patient [place] Electronic Signature(s) Signed: 12/26/2015 2:43:59 PM By: Ruthine Dose Entered By: Ruthine Dose on 12/26/2015 14:43:59 Vanderheiden, [name] ([id number]) -------------------------------------------------------------------------------- Lower Extremity Assessment Details Patient Name: Carrie Mulligan C. Date of Service: 12/26/2015 1:30 PM Medical Record Patient Account Number: [id number] Number: Treating RN: [name] January 16, 1935 (80 y.o. Other Clinician: Date of Birth/Sex: Female) Treating [name], [name] Primary Care Physician/Extender: Claudette Laws Physician: Referring Physician: [name] in Treatment: 4 Edema Assessment Assessed: [Left: No] [Right: No] E[Left: dema] [Right: :] Calf Left: Right: Point of Measurement: 36 cm From Medial Instep 36.4 cm 35.8 cm Ankle Left: Right: Point of Measurement: 10 cm From Medial Instep 23.2 cm 24.2 cm Vascular Assessment Pulses: Posterior Tibial Dorsalis Pedis Palpable: [Left:Yes] [Right:Yes] Extremity colors, hair growth, and conditions: Extremity Color: [Left:Hyperpigmented] [Right:Hyperpigmented] Temperature of Extremity: [Left:Warm] [Right:Warm] Capillary Refill: [Left:< 3 seconds] [Right:< 3 seconds] Electronic Signature(s) Signed: 12/26/2015 5:01:48 PM By: [name] Entered By: [name] on 12/26/2015 13:57:43 No, Betzaira C. ([id number]) -------------------------------------------------------------------------------- Multi Wound Chart Details Patient Name: Carrie Mulligan C. Date of Service: 12/26/2015 1:30 PM Medical Record Patient Account Number:  [id number] Number: Treating RN: [name] 1935/06/13 (80 y.o. Other Clinician: Date of Birth/Sex: Female) Treating [name], [name] Primary Care Physician/Extender: Claudette Laws Physician: Referring Physician: [name] in Treatment: 4 Vital Signs Height(in): 68 Pulse(bpm): 50 Weight(lbs): 178 Blood Pressure 117/42 (mmHg): Body Mass Index(BMI): 27 Temperature(F): Respiratory Rate 18 (breaths/min): Photos: [1:No Photos] [2:No Photos] [3:No Photos] Wound Location: [1:Left Lower Leg - Medial] [2:Right Lower Leg - Lateral Left Lower Leg - Anterior,] [3:Proximal] Wounding Event: [1:Gradually Appeared] [2:Gradually Appeared] [3:Trauma] Primary Etiology: [1:Lymphedema] [2:Lymphedema] [3:Trauma, Other] Comorbid History: [1:Cataracts, Arrhythmia, Hypotension, Osteoarthritis] [2:Cataracts, Arrhythmia, Hypotension, Osteoarthritis] [3:Cataracts, Arrhythmia, Hypotension, Osteoarthritis] Date Acquired: [1:07/04/2015] [2:07/04/2015] [3:12/18/2015] Weeks of Treatment: [1:4] [2:4] [3:1] Wound Status: [1:Open] [2:Open] [  3:Open] Measurements L x W x D 1x0.7x0.2 [2:1.2x0.9x0.2] [3:3.8x1x0.2] (cm) Area (cm) : [1:0.55] [2:0.848] [3:2.985] Volume (cm) : [1:0.11] [2:0.17] [3:0.597] % Reduction in Area: [1:98.80%] [2:40.00%] [3:0.00%] % Reduction in Volume: 98.80% [2:-20.60%] [3:0.00%] Classification: [1:Partial Thickness] [2:Partial Thickness] [3:Partial Thickness] Exudate Amount: [1:Large] [2:Large] [3:Large] Exudate Type: [1:Serosanguineous] [2:Serosanguineous] [3:Serosanguineous] Exudate Color: [1:red, brown] [2:red, brown] [3:red, brown] Wound Margin: [1:Distinct, outline attached] [2:Distinct, outline attached] [3:Flat and Intact] Granulation Amount: [1:Medium (34-66%)] [2:Medium (34-66%)] [3:Large (67-100%)] Granulation Quality: [1:Red] [2:Pink, Pale] [3:Red, Pink] Necrotic Amount: [1:Medium (34-66%)] [2:Medium (34-66%)] [3:None Present (0%)] Exposed  Structures: [1:Fascia: No Fat: No] [2:Fascia: No Fat: No] [3:Fascia: No Fat: No] Tendon: No Tendon: No Tendon: No Muscle: No Muscle: No Muscle: No Joint: No Joint: No Joint: No Bone: No Bone: No Bone: No Limited to Skin Limited to Skin Limited to Skin Breakdown Breakdown Breakdown Epithelialization: None None None Periwound Skin Texture: Edema: No Edema: Yes Edema: Yes Excoriation: No Excoriation: No Induration: No Induration: No Callus: No Callus: No Crepitus: No Crepitus: No Fluctuance: No Fluctuance: No Friable: No Friable: No Rash: No Rash: No Scarring: No Scarring: No Periwound Skin Moist: Yes Moist: Yes Moist: Yes Moisture: Maceration: No Maceration: No Dry/Scaly: No Dry/Scaly: No Periwound Skin Color: Atrophie Blanche: No Atrophie Blanche: No No Abnormalities Noted Cyanosis: No Cyanosis: No Ecchymosis: No Ecchymosis: No Erythema: No Erythema: No Hemosiderin Staining: No Hemosiderin Staining: No Mottled: No Mottled: No Pallor: No Pallor: No Rubor: No Rubor: No Temperature: No Abnormality No Abnormality No Abnormality Tenderness on Yes Yes Yes Palpation: Wound Preparation: Ulcer Cleansing: Ulcer Cleansing: Ulcer Cleansing: Rinsed/Irrigated with Rinsed/Irrigated with Rinsed/Irrigated with Saline Saline Saline Topical Anesthetic Topical Anesthetic Topical Anesthetic Applied: Other: lidocaine Applied: Other: lidocaine Applied: None 4% 4% Assessment Notes: N/A N/A unable to correctly measure, pt has on steri- strips so mearements are the ones from last week. Treatment Notes Electronic Signature(s) Signed: 12/26/2015 5:01:48 PM By: [name] Entered By: [name] on 12/26/2015 14:01:57 Wrinkle, [name] ([id number]) -------------------------------------------------------------------------------- Multi-Disciplinary Care Plan Details Patient Name: SHAYLINN, ZURLO. Date of Service: 12/26/2015 1:30 PM Medical Record  Patient Account Number: [id number] Number: Treating RN: [name] [birth year] (80 y.o. Other Clinician: Date of Birth/Sex: Female) Treating [name], [name] Primary Care Physician/Extender: Claudette Laws Physician: Referring Physician: [name] in Treatment: 4 Active Inactive Orientation to the Wound Care Program Nursing Diagnoses: Knowledge deficit related to the wound healing center program Goals: Patient/caregiver will verbalize understanding of the [facility] Date Initiated: 11/22/2015 Goal Status: Active Interventions: Provide education on orientation to the wound center Notes: Venous Leg Ulcer Nursing Diagnoses: Knowledge deficit related to disease process and management Potential for venous Insuffiency (use before diagnosis confirmed) Goals: Patient will maintain optimal edema control Date Initiated: 11/22/2015 Goal Status: Active Patient/caregiver will verbalize understanding of disease process and disease management Date Initiated: 11/22/2015 Goal Status: Active Verify adequate tissue perfusion prior to therapeutic compression application Date Initiated: 11/22/2015 Goal Status: Active Interventions: Assess peripheral edema status every visit. DALEYSHA, HUBERS ([id number]) Compression as ordered Provide education on venous insufficiency Treatment Activities: Non-invasive vascular studies : 11/22/2015 Therapeutic compression applied : 11/22/2015 Notes: Wound/Skin Impairment Nursing Diagnoses: Impaired tissue integrity Knowledge deficit related to ulceration/compromised skin integrity Goals: Patient/caregiver will verbalize understanding of skin care regimen Date Initiated: 11/22/2015 Goal Status: Active Ulcer/skin breakdown will have a volume reduction of 30% by week 4 Date Initiated: 11/22/2015 Goal Status: Active Ulcer/skin breakdown will have a volume reduction of 50% by week 8 Date  Initiated: 11/22/2015 Goal  Status: Active Ulcer/skin breakdown will have a volume reduction of 80% by week 12 Date Initiated: 11/22/2015 Goal Status: Active Ulcer/skin breakdown will heal within 14 weeks Date Initiated: 11/22/2015 Goal Status: Active Interventions: Assess patient/caregiver ability to perform ulcer/skin care regimen upon admission and as needed Assess ulceration(s) every visit Provide education on ulcer and skin care Treatment Activities: Skin care regimen initiated : 11/22/2015 Topical wound management initiated : 11/22/2015 Notes: Electronic Signature(s) Signed: 12/26/2015 5:01:48 PM By: De Burrs, [name] ([id number]) Entered By: [name] on 12/26/2015 14:00:57 White, [name] ([id number]) -------------------------------------------------------------------------------- Pain Assessment Details Patient Name: Carrie Mulligan C. Date of Service: 12/26/2015 1:30 PM Medical Record Patient Account Number: [id number] Number: Treating RN: [name] 11/19/1934 (80 y.o. Other Clinician: Date of Birth/Sex: Female) Treating [name], [name] Primary Care Physician/Extender: Claudette Laws Physician: Referring Physician: [name] in Treatment: 4 Active Problems Location of Pain Severity and Description of Pain Patient Has Paino No Site Locations With Dressing Change: No Pain Management and Medication Current Pain Management: Electronic Signature(s) Signed: 12/26/2015 5:01:48 PM By: [name] Entered By: [name] on 12/26/2015 13:40:51 Casalino, [name] ([id number]) -------------------------------------------------------------------------------- Patient/Caregiver Education Details Patient Name: [name] Date of Service: 12/26/2015 1:30 PM Medical Record Patient Account Number: [id number] Number: Treating RN: [name] [birth year] (80 y.o. Other Clinician: Date of Birth/Gender: Female) Treating  [name], [name] Primary Care Physician/Extender: Claudette Laws Physician: Suella Grove in Treatment: 4 Referring Physician: [name] Education Assessment Education Provided To: Patient Education Topics Provided Wound/Skin Impairment: Handouts: Other: do not get wraps wet Electronic Signature(s) Signed: 12/26/2015 5:01:48 PM By: [name] Entered By: [name] on 12/26/2015 14:19:05 Dallman, Quinita Loletha Grayer ([id number]) -------------------------------------------------------------------------------- Wound Assessment Details Patient Name: Carrie Mulligan C. Date of Service: 12/26/2015 1:30 PM Medical Record Patient Account Number: [id number] Number: Treating RN: [name] [name] 01, 1935 (80 y.o. Other Clinician: Date of Birth/Sex: Female) Treating [name], [name] Primary Care Physician/Extender: Claudette Laws Physician: Referring Physician: [name] in Treatment: 4 Wound Status Wound Number: 1 Primary Lymphedema Etiology: Wound Location: Left Lower Leg - Medial Wound Status: Open Wounding Event: Gradually Appeared Comorbid Cataracts, Arrhythmia, Hypotension, Date Acquired: 07/04/2015 History: Osteoarthritis Weeks Of Treatment: 4 Clustered Wound: No Photos Photo Uploaded By: [name] on 12/26/2015 17:05:36 Wound Measurements Length: (cm) 1 Width: (cm) 0.7 Depth: (cm) 0.2 Area: (cm) 0.55 Volume: (cm) 0.11 % Reduction in Area: 98.8% % Reduction in Volume: 98.8% Epithelialization: None Tunneling: No Undermining: No Wound Description Classification: Partial Thickness Wound Margin: Distinct, outline attached Exudate Amount: Large Exudate Type: Serosanguineous Exudate Color: red, brown Foul Odor After Cleansing: No Wound Bed Granulation Amount: Medium (34-66%) Exposed Structure Granulation Quality: Red Fascia Exposed: No Anna, Laiklynn C. ([id number]) Necrotic Amount: Medium (34-66%) Fat Layer Exposed: No Necrotic  Quality: Adherent Slough Tendon Exposed: No Muscle Exposed: No Joint Exposed: No Bone Exposed: No Limited to Skin Breakdown Periwound Skin Texture Texture Color No Abnormalities Noted: No No Abnormalities Noted: No Callus: No Atrophie Blanche: No Crepitus: No Cyanosis: No Excoriation: No Ecchymosis: No Fluctuance: No Erythema: No Friable: No Hemosiderin Staining: No Induration: No Mottled: No Localized Edema: No Pallor: No Rash: No Rubor: No Scarring: No Temperature / Pain Moisture Temperature: No Abnormality No Abnormalities Noted: No Tenderness on Palpation: Yes Dry / Scaly: No Maceration: No Moist: Yes Wound Preparation Ulcer Cleansing: Rinsed/Irrigated with Saline Topical Anesthetic Applied: Other: lidocaine 4%, Treatment Notes Wound #1 (Left, Medial Lower Leg) 1. Cleansed with: Cleanse wound  with antibacterial soap and water 2. Anesthetic Topical Lidocaine 4% cream to wound bed prior to debridement 3. Peri-wound Care: Barrier cream 4. Dressing Applied: Other dressing (specify in notes) 5. Secondary Dressing Applied ABD Pad Dry Gauze 7. Secured with Tape 3 Layer Compression System - Bilateral Notes JAIE, ANDA. ([id number]) anchor with Louretta Parma Electronic Signature(s) Signed: 12/26/2015 5:01:48 PM By: [name] Entered By: [name] on 12/26/2015 13:59:33 Delavega, Yosselin Loletha Grayer ([id number]) -------------------------------------------------------------------------------- Wound Assessment Details Patient Name: [name], Aeliana C. Date of Service: 12/26/2015 1:30 PM Medical Record Patient Account Number: [id number] Number: Treating RN: [name] [birth year] (80 y.o. Other Clinician: Date of Birth/Sex: Female) Treating [name], [name] Primary Care Physician/Extender: Claudette Laws Physician: Referring Physician: [name] in Treatment: 4 Wound Status Wound Number: 2 Primary Lymphedema Etiology: Wound  Location: Right Lower Leg - Lateral Wound Status: Open Wounding Event: Gradually Appeared Comorbid Cataracts, Arrhythmia, Hypotension, Date Acquired: 07/04/2015 History: Osteoarthritis Weeks Of Treatment: 4 Clustered Wound: No Photos Photo Uploaded By: [name] on 12/26/2015 17:05:36 Wound Measurements Length: (cm) 1.2 % Reduction i Width: (cm) 0.9 % Reduction i Depth: (cm) 0.2 Epithelializa Area: (cm) 0.848 Tunneling: Volume: (cm) 0.17 Undermining: n Area: 40% n Volume: -20.6% tion: None No No Wound Description Classification: Partial Thickness Wound Margin: Distinct, outline attached Exudate Amount: Large Exudate Type: Serosanguineous Exudate Color: red, brown Foul Odor After Cleansing: No Wound Bed Granulation Amount: Medium (34-66%) Exposed Structure Granulation Quality: Pink, Pale Fascia Exposed: No Hach, Mckay C. ([id number]) Necrotic Amount: Medium (34-66%) Fat Layer Exposed: No Necrotic Quality: Adherent Slough Tendon Exposed: No Muscle Exposed: No Joint Exposed: No Bone Exposed: No Limited to Skin Breakdown Periwound Skin Texture Texture Color No Abnormalities Noted: No No Abnormalities Noted: No Callus: No Atrophie Blanche: No Crepitus: No Cyanosis: No Excoriation: No Ecchymosis: No Fluctuance: No Erythema: No Friable: No Hemosiderin Staining: No Induration: No Mottled: No Localized Edema: Yes Pallor: No Rash: No Rubor: No Scarring: No Temperature / Pain Moisture Temperature: No Abnormality No Abnormalities Noted: No Tenderness on Palpation: Yes Dry / Scaly: No Maceration: No Moist: Yes Wound Preparation Ulcer Cleansing: Rinsed/Irrigated with Saline Topical Anesthetic Applied: Other: lidocaine 4%, Treatment Notes Wound #2 (Right, Lateral Lower Leg) 1. Cleansed with: Cleanse wound with antibacterial soap and water 2. Anesthetic Topical Lidocaine 4% cream to wound bed prior to debridement 3. Peri-wound  Care: Barrier cream 4. Dressing Applied: Other dressing (specify in notes) 5. Secondary Dressing Applied ABD Pad Dry Gauze 7. Secured with Tape 3 Layer Compression System - Bilateral Notes DONNELL, HARSHA. ([id number]) anchor with Louretta Parma Electronic Signature(s) Signed: 12/26/2015 5:01:48 PM By: [name] Entered By: [name] on 12/26/2015 13:58:45 Breth, Fatema Loletha Grayer ([id number]) -------------------------------------------------------------------------------- Wound Assessment Details Patient Name: [name], Jaton C. Date of Service: 12/26/2015 1:30 PM Medical Record Patient Account Number: [id number] Number: Treating RN: [name] 1934-07-21 (80 y.o. Other Clinician: Date of Birth/Sex: Female) Treating [name], [name] Primary Care Physician/Extender: Claudette Laws Physician: Referring Physician: [name] in Treatment: 4 Wound Status Wound Number: 3 Primary Trauma, Other Etiology: Wound Location: Left Lower Leg - Anterior, Proximal Wound Status: Open Wounding Event: Trauma Comorbid Cataracts, Arrhythmia, Hypotension, History: Osteoarthritis Date Acquired: 12/18/2015 Weeks Of Treatment: 1 Clustered Wound: No Photos Photo Uploaded By: [name] on 12/26/2015 17:05:46 Wound Measurements Length: (cm) 3.5 Width: (cm) 0.6 Depth: (cm) 0.1 Area: (cm) 1.649 Volume: (cm) 0.165 % Reduction in Area: 44.8% % Reduction in Volume: 72.4% Epithelialization: None Wound Description Classification: Partial Thickness Wound Margin:  Flat and Intact Exudate Amount: Large Exudate Type: Serosanguineous Exudate Color: red, brown Foul Odor After Cleansing: No Wound Bed Granulation Amount: Large (67-100%) Exposed Structure [place], Garyn C. ([id number]) Granulation Quality: Red, Pink Fascia Exposed: No Necrotic Amount: None Present (0%) Fat Layer Exposed: No Tendon Exposed: No Muscle Exposed: No Joint Exposed: No Bone  Exposed: No Limited to Skin Breakdown Periwound Skin Texture Texture Color No Abnormalities Noted: No No Abnormalities Noted: No Localized Edema: Yes Temperature / Pain Moisture Temperature: No Abnormality No Abnormalities Noted: No Tenderness on Palpation: Yes Moist: Yes Wound Preparation Ulcer Cleansing: Rinsed/Irrigated with Saline Topical Anesthetic Applied: None Treatment Notes Wound #3 (Left, Proximal, Anterior Lower Leg) 1. Cleansed with: Cleanse wound with antibacterial soap and water 2. Anesthetic Topical Lidocaine 4% cream to wound bed prior to debridement 3. Peri-wound Care: Barrier cream 4. Dressing Applied: Other dressing (specify in notes) 5. Secondary Dressing Applied ABD Pad Dry Gauze 7. Secured with Tape 3 Layer Compression System - Bilateral Notes anchor with unna Electronic Signature(s) Signed: 12/26/2015 5:01:48 PM By: [name] Entered By: [name] on 12/26/2015 14:16:14 [place], [name] ([id number]) -------------------------------------------------------------------------------- Vitals Details Patient Name: Carrie Mulligan C. Date of Service: 12/26/2015 1:30 PM Medical Record Patient Account Number: [id number] Number: Treating RN: [name] 20-Dec-1934 (80 y.o. Other Clinician: Date of Birth/Sex: Female) Treating [name], [name] Primary Care Physician/Extender: Claudette Laws Physician: Referring Physician: [name] in Treatment: 4 Vital Signs Time Taken: 13:40 Pulse (bpm): 50 Height (in): 68 Respiratory Rate (breaths/min): 18 Weight (lbs): 178 Blood Pressure (mmHg): 117/42 Body Mass Index (BMI): 27.1 Reference Range: 80 - 120 mg / dl Electronic Signature(s) Signed: 12/26/2015 5:01:48 PM By: [name] Entered By: [name] on 12/26/2015 13:43:40

## 2015-12-28 ENCOUNTER — Ambulatory Visit: Payer: Medicare Other

## 2015-12-28 DIAGNOSIS — I5042 Chronic combined systolic (congestive) and diastolic (congestive) heart failure: Secondary | ICD-10-CM

## 2015-12-28 DIAGNOSIS — Z952 Presence of prosthetic heart valve: Secondary | ICD-10-CM

## 2015-12-28 DIAGNOSIS — Z9889 Other specified postprocedural states: Secondary | ICD-10-CM

## 2015-12-28 NOTE — Progress Notes (Signed)
[name], [name] ([id number]) Visit Report for 12/26/2015 Chief Complaint Document Details [name], [name] Date of Service: 12/26/2015 1:30 PM Patient Name: C. Patient Account Number: [id number] Medical Record Treating RN: [name] [id number] Number: Other Clinician: [birth year] (81 y.o. Treating [name] Date of Birth/Sex: Female) Physician/Extender: G Primary Care [name] Physician: Referring Physician: [name] in Treatment: 4 Information Obtained from: Patient Chief Complaint Patient is here for review of bilateral lower extremity wounds dating back to December 2016 Electronic Signature(s) Signed: 12/27/2015 12:38:30 PM By: [name] MD Entered By: [name] on 12/27/2015 07:42:43 [name], [name] ([id number]) -------------------------------------------------------------------------------- Debridement Details [name] Date of Service: 12/26/2015 1:30 PM Patient Name: C. Patient Account Number: [id number] Medical Record Treating RN: [name] [id number] Number: Other Clinician: Jun 06, 1935 (81 y.o. Treating [name] Date of Birth/Sex: Female) Physician/Extender: G Primary Care [name] Physician: Referring Physician: [name] in Treatment: 4 Debridement Performed for Wound #2 Right,Lateral Lower Leg Assessment: Performed By: Physician [name], MD Debridement: Debridement Pre-procedure Yes Verification/Time Out Taken: Start Time: 14:11 Pain Control: Other : lidocaine 4% cream Level: Skin/Subcutaneous Tissue Total Area Debrided (L x 1.2 (cm) x 0.9 (cm) = 1.08 (cm) W): Tissue and other Viable, Non-Viable, Exudate, Fibrin/Slough, Subcutaneous material debrided: Instrument: Curette Bleeding: Minimum Hemostasis Achieved: Pressure End Time: 14:12 Procedural Pain: 0 Post Procedural Pain: 0 Response to Treatment: Procedure was tolerated well Post Debridement Measurements of  Total Wound Length: (cm) 1.2 Width: (cm) 0.9 Depth: (cm) 0.2 Volume: (cm) 0.17 Post Procedure Diagnosis Same as Pre-procedure Electronic Signature(s) Signed: 12/27/2015 12:38:30 PM By: [name] MD Signed: 12/27/2015 5:39:03 PM By: [name] Previous Signature: 12/26/2015 5:01:48 PM Version By: Janith Lima ([id number]) Entered By: [name] on 12/27/2015 07:42:29 [name], [name] ([id number]) -------------------------------------------------------------------------------- HPI Details [name] Date of Service: 12/26/2015 1:30 PM Patient Name: C. Patient Account Number: [id number] Medical Record Treating RN: [name] [id number] Number: Other Clinician: 05-Jun-1935 (81 y.o. Treating [name], [place] Date of Birth/Sex: Female) Physician/Extender: G [place] Physician: Referring Physician: [name] in Treatment: 4 History of Present Illness HPI Description: 11/22/15; this is [place] is [name] 81 year old woman who lives at home on her own. According to the patient and her daughter was present she has had long-standing edema in her legs dating back many years. She also has [name] history of chronic systolic heart failure, atrial fibrillation and is status post mitral valve replacement. [facility] showed an ejection fraction of 40-45% she is on Lasix 60 mg [name] day and spironolactone 25 mg [name] day. Her current problem began in December around Christmas time she developed [name] small hematoma in the medial part of her left leg which rapidly expanded to [name] very large hematoma that required surgical debridement. This situation was complicated by the fact that the patient is on long-standing Coumadin for mechanical heart valve. She went to the OR had this evacuated on January 4 17. The wound has gradually improved however she has developed [name] small wounds around this area and more recently [name]  wound on the right lateral leg. She is weeping edema fluid. The patient is already been to see vascular surgery. It was recommended that she wear Unna boots, she did not tolerate this due to pain in the left leg. She was then prescribed Juzo stockings and really can't get these on herself although truthfully there is probably too much edema for [name] Juzo stockings currently. She is  not [name] diabetic and has no history of PAD or claudication that I could elicit. She does not use the external compression pumps reliably. She comes today with notes from her primary physician and Dr. Ronalee Belts both recommending various forms of compression but the patient does not really complied with them. Has been using the external compression pumps with some regularity but certainly not daily on the right leg and this has helped. I also note that her daughter tells me the history that she does not sleep in bed at home. She has [name] hospital bed but with her legs up she finds this painful so she sleeps in the couch sitting up with her legs dependent. 11/29/15; the patient is arrives today accompanied by her son. He expresses satisfaction that she is maintained the compression all week. 12/06/15; the patient has kept her Profore light wraps on, we have good edema control no major change in the wounds we have been using Aquacel 12/13/15; changed to RTD last week. One of the 3 wounds on her left medial leg is healed she has 2 remaining wounds here and one on the right lateral leg. 12/18/2015 -- the patient was at Dr. Nino Parsley office today and he was seeing her for an arterial study. While the wrap was being removed she had an inadvertent laceration of the left proximal anterior leg which bled quite profusely and [name] compression dressing was applied over this and the patient was here to get her Profore wraps done. I was asked to see the patient to make an assessment and treat appropriately. 12/26/15; the patient's injury on the left  proximal leg and Steri-Strips removed after soaking. There is an open area here. The original wounds to still open on the right lateral and left medial leg. Electronic Signature(s) DOMINIK, OWINGS ([id number]) Signed: 12/27/2015 12:38:30 PM By: [name] MD Entered By: [name] on 12/27/2015 07:44:27 [name] ([id number]) -------------------------------------------------------------------------------- Physical Exam Details [name] Date of Service: 12/26/2015 1:30 PM Patient Name: C. Patient Account Number: [id number] Medical Record Treating RN: [name] [id number] Number: Other Clinician: April 07, 1935 (81 y.o. Treating Durell Lofaso Date of Birth/Sex: Female) Physician/Extender: G Primary Care [name] Physician: Referring Physician: [name] in Treatment: 4 Notes Wound exam; the area on the right lateral leg is deep and covered with some nonviable surface slough that is difficult to remove. The area on the left medial leg appears to be healthy. The new she scissors injury on the left anterior leg was exposed there was older and drainage here but I think all of this looks fairly benign after we undressed the leg. No cultures were done and no empiric antibiotics were prescribed Electronic Signature(s) Signed: 12/27/2015 12:38:30 PM By: [name] MD Entered By: [name] on 12/27/2015 07:45:21 [name], [name] ([id number]) -------------------------------------------------------------------------------- Physician Orders Details [name] Date of Service: 12/26/2015 1:30 PM Patient Name: C. Patient Account Number: [id number] Medical Record Treating RN: [name] [id number] Number: Other Clinician: January 14, 1935 (81 y.o. Treating Shannon Balthazar, Jeannette Date of Birth/Sex: Female) Physician/Extender: G Primary Care [name] Physician: Referring Physician: [name] in Treatment: 4 Verbal /  Phone Orders: Yes Clinician: Carolyne Fiscal, Debi Read Back and Verified: Yes Diagnosis Coding Wound Cleansing Wound #1 Left,Medial Lower Leg o May shower with protection. o No tub bath. Wound #2 Right,Lateral Lower Leg o May shower with protection. o No tub bath. Wound #3 Left,Proximal,Anterior Lower Leg o May shower with protection. o May shower with protection. o No tub bath.   o No tub bath. Anesthetic Wound #1 Left,Medial Lower Leg o Topical Lidocaine 4% cream applied to wound bed prior to debridement Wound #2 Right,Lateral Lower Leg o Topical Lidocaine 4% cream applied to wound bed prior to debridement Wound #3 Left,Proximal,Anterior Lower Leg o Topical Lidocaine 4% cream applied to wound bed prior to debridement o Topical Lidocaine 4% cream applied to wound bed prior to debridement Skin Barriers/Peri-Wound Care Wound #1 Left,Medial Lower Leg o Barrier cream Wound #2 Right,Lateral Lower Leg o Barrier cream NAMIRAH, SANTAANA. ([id number]) Wound #3 Left,Proximal,Anterior Lower Leg o Barrier cream Primary Wound Dressing Wound #1 Left,Medial Lower Leg o Other: - RTD Wound #2 Right,Lateral Lower Leg o Other: - RTD Wound #3 Left,Proximal,Anterior Lower Leg o Other: - RTD Secondary Dressing Wound #1 Left,Medial Lower Leg o ABD pad o Dry Gauze Wound #2 Right,Lateral Lower Leg o ABD pad o Dry Gauze Wound #3 Left,Proximal,Anterior Lower Leg o Dry Gauze Dressing Change Frequency Wound #1 Left,Medial Lower Leg o Change dressing every week Wound #2 Right,Lateral Lower Leg o Change dressing every week Follow-up Appointments Wound #1 Left,Medial Lower Leg o Return Appointment in 1 week. Wound #2 Right,Lateral Lower Leg o Return Appointment in 1 week. Edema Control Wound #1 Left,Medial Lower Leg o 3 Layer Compression System - Bilateral - unna to anchor Wound #2 Right,Lateral Lower Leg o 3 Layer Compression System  - Bilateral - unna to anchor Sangha, Lanetra C. ([id number]) Wound #3 Left,Proximal,Anterior Lower Leg o 3 Layer Compression System - Bilateral - unna to anchor Additional Orders / Instructions Wound #1 Left,Medial Lower Leg o Increase protein intake. o Activity as tolerated Wound #2 Right,Lateral Lower Leg o Increase protein intake. o Activity as tolerated Wound #3 Left,Proximal,Anterior Lower Leg o Increase protein intake. o Activity as tolerated Electronic Signature(s) Signed: 12/26/2015 5:01:48 PM By: [name] Signed: 12/27/2015 12:38:30 PM By: [name] MD Entered By: [name] on 12/26/2015 14:17:37 Allbritton, [name] ([id number]) -------------------------------------------------------------------------------- Problem List Details [name] Date of Service: 12/26/2015 1:30 PM Patient Name: C. Patient Account Number: [id number] Medical Record Treating RN: [name] [id number] Number: Other Clinician: 1934-11-09 (81 y.o. Treating Nijah Tejera Date of Birth/Sex: Female) Physician/Extender: G Primary Care [name] Physician: Referring Physician: [name] in Treatment: 4 Active Problems ICD-10 Encounter Code Description Active Date Diagnosis I87.333 Chronic venous hypertension (idiopathic) with ulcer and 11/22/2015 Yes inflammation of bilateral lower extremity I89.0 Lymphedema, not elsewhere classified 11/22/2015 Yes [id number] Chronic systolic (congestive) heart failure 11/22/2015 Yes S81.812A Laceration without foreign body, left lower leg, initial 12/18/2015 Yes encounter Inactive Problems Resolved Problems Electronic Signature(s) Signed: 12/27/2015 12:38:30 PM By: [name] MD Entered By: [name] on 12/27/2015 07:42:08 Poehlman, [name] ([id number]) -------------------------------------------------------------------------------- Progress Note Details [name] Date of Service:  12/26/2015 1:30 PM Patient Name: C. Patient Account Number: [id number] Medical Record Treating RN: [name] [id number] Number: Other Clinician: Mar 05, 1935 (81 y.o. Treating Diamonds Lippard, [place] Date of Birth/Sex: Female) Physician/Extender: G Primary Care [name] Physician: Referring Physician: [name] in Treatment: 4 Subjective Chief Complaint Information obtained from Patient Patient is here for review of bilateral lower extremity wounds dating back to December 2016 History of Present Illness (HPI) 11/22/15; this is [place] is [name] 81 year old woman who lives at home on her own. According to the patient and her daughter was present she has had long-standing edema in her legs dating back many years. She also has [name] history of chronic systolic heart failure, atrial fibrillation and is status post mitral valve  replacement. Last [facility] showed an ejection fraction of 40-45% she is on Lasix 60 mg [name] day and spironolactone 25 mg [name] day. Her current problem began in December around Christmas time she developed [name] small hematoma in the medial part of her left leg which rapidly expanded to [name] very large hematoma that required surgical debridement. This situation was complicated by the fact that the patient is on long-standing Coumadin for mechanical heart valve. She went to the OR had this evacuated on January 4 17. The wound has gradually improved however she has developed [name] small wounds around this area and more recently [name] wound on the right lateral leg. She is weeping edema fluid. The patient is already been to see vascular surgery. It was recommended that she wear Unna boots, she did not tolerate this due to pain in the left leg. She was then prescribed Juzo stockings and really can't get these on herself although truthfully there is probably too much edema for [name] Juzo stockings currently. She is not [name] diabetic and has no history of PAD or  claudication that I could elicit. She does not use the external compression pumps reliably. She comes today with notes from her primary physician and Dr. Ronalee Belts both recommending various forms of compression but the patient does not really complied with them. Has been using the external compression pumps with some regularity but certainly not daily on the right leg and this has helped. I also note that her daughter tells me the history that she does not sleep in bed at home. She has [name] hospital bed but with her legs up she finds this painful so she sleeps in the couch sitting up with her legs dependent. 11/29/15; the patient is arrives today accompanied by her son. He expresses satisfaction that she is maintained the compression all week. 12/06/15; the patient has kept her Profore light wraps on, we have good edema control no major change in the wounds we have been using Aquacel 12/13/15; changed to RTD last week. One of the 3 wounds on her left medial leg is healed she has 2 remaining wounds here and one on the right lateral leg. 12/18/2015 -- the patient was at Dr. Nino Parsley office today and he was seeing her for an arterial study. While the wrap was being removed she had an inadvertent laceration of the left proximal anterior leg which bled quite profusely and [name] compression dressing was applied over this and the patient was here to get her Profore wraps done. I was asked to see the patient to make an assessment and treat appropriately. [name], [name] ([id number]) 12/26/15; the patient's injury on the left proximal leg and Steri-Strips removed after soaking. There is an open area here. The original wounds to still open on the right lateral and left medial leg. Objective Constitutional Vitals Time Taken: 1:40 PM, Height: 68 in, Weight: 178 lbs, BMI: 27.1, Pulse: 50 bpm, Respiratory Rate: 18 breaths/min, Blood Pressure: 117/42 mmHg. Integumentary (Hair, Skin) Wound #1 status is Open. Original  cause of wound was Gradually Appeared. The wound is located on the Left,Medial Lower Leg. The wound measures 1cm length x 0.7cm width x 0.2cm depth; 0.55cm^2 area and 0.11cm^3 volume. The wound is limited to skin breakdown. There is no tunneling or undermining noted. There is [name] large amount of serosanguineous drainage noted. The wound margin is distinct with the outline attached to the wound base. There is medium (34-66%) red granulation within the wound bed.  There is [name] medium (34-66%) amount of necrotic tissue within the wound bed including Adherent Slough. The periwound skin appearance exhibited: Moist. The periwound skin appearance did not exhibit: Callus, Crepitus, Excoriation, Fluctuance, Friable, Induration, Localized Edema, Rash, Scarring, Dry/Scaly, Maceration, Atrophie Blanche, Cyanosis, Ecchymosis, Hemosiderin Staining, Mottled, Pallor, Rubor, Erythema. Periwound temperature was noted as No Abnormality. The periwound has tenderness on palpation. Wound #2 status is Open. Original cause of wound was Gradually Appeared. The wound is located on the Right,Lateral Lower Leg. The wound measures 1.2cm length x 0.9cm width x 0.2cm depth; 0.848cm^2 area and 0.17cm^3 volume. The wound is limited to skin breakdown. There is no tunneling or undermining noted. There is [name] large amount of serosanguineous drainage noted. The wound margin is distinct with the outline attached to the wound base. There is medium (34-66%) pink, pale granulation within the wound bed. There is [name] medium (34-66%) amount of necrotic tissue within the wound bed including Adherent Slough. The periwound skin appearance exhibited: Localized Edema, Moist. The periwound skin appearance did not exhibit: Callus, Crepitus, Excoriation, Fluctuance, Friable, Induration, Rash, Scarring, Dry/Scaly, Maceration, Atrophie Blanche, Cyanosis, Ecchymosis, Hemosiderin Staining, Mottled, Pallor, Rubor, Erythema. Periwound temperature was noted as No  Abnormality. The periwound has tenderness on palpation. Wound #3 status is Open. Original cause of wound was Trauma. The wound is located on the Left,Proximal,Anterior Lower Leg. The wound measures 3.5cm length x 0.6cm width x 0.1cm depth; 1.649cm^2 area and 0.165cm^3 volume. The wound is limited to skin breakdown. There is [name] large amount of serosanguineous drainage noted. The wound margin is flat and intact. There is large (67-100%) red, pink granulation within the wound bed. There is no necrotic tissue within the wound bed. The periwound skin appearance exhibited: Localized Edema, Moist. Periwound temperature was noted as No Abnormality. The periwound has tenderness on palpation. AARIAH, ESPITIA ([id number]) Assessment Active Problems ICD-10 [phone] - Chronic venous hypertension (idiopathic) with ulcer and inflammation of bilateral lower extremity I89.0 - Lymphedema, not elsewhere classified [id number] - Chronic systolic (congestive) heart failure [id number] - Laceration without foreign body, left lower leg, initial encounter Procedures Wound #2 Wound #2 is [name] Lymphedema located on the Right,Lateral Lower Leg . There was [name] Skin/Subcutaneous Tissue Debridement [id number]) debridement with total area of 1.08 sq cm performed by [name], MD. with the following instrument(s): Curette to remove Viable and Non-Viable tissue/material including Exudate, Fibrin/Slough, and Subcutaneous after achieving pain control using Other (lidocaine 4% cream). [name] time out was conducted prior to the start of the procedure. [name] Minimum amount of bleeding was controlled with Pressure. The procedure was tolerated well with [name] pain level of 0 throughout and [name] pain level of 0 following the procedure. Post Debridement Measurements: 1.2cm length x 0.9cm width x 0.2cm depth; 0.17cm^3 volume. Post procedure Diagnosis Wound #2: Same as Pre-Procedure Plan Wound Cleansing: Wound #1 Left,Medial Lower Leg: May  shower with protection. No tub bath. Wound #2 Right,Lateral Lower Leg: May shower with protection. No tub bath. Wound #3 Left,Proximal,Anterior Lower Leg: May shower with protection. May shower with protection. No tub bath. No tub bath. ANALLELY, [name] ([id number]) Anesthetic: Wound #1 Left,Medial Lower Leg: Topical Lidocaine 4% cream applied to wound bed prior to debridement Wound #2 Right,Lateral Lower Leg: Topical Lidocaine 4% cream applied to wound bed prior to debridement Wound #3 Left,Proximal,Anterior Lower Leg: Topical Lidocaine 4% cream applied to wound bed prior to debridement Topical Lidocaine 4% cream applied to wound bed prior to debridement Skin Barriers/Peri-Wound Care: Wound #1 Left,Medial  Lower Leg: Barrier cream Wound #2 Right,Lateral Lower Leg: Barrier cream Wound #3 Left,Proximal,Anterior Lower Leg: Barrier cream Primary Wound Dressing: Wound #1 Left,Medial Lower Leg: Other: - RTD Wound #2 Right,Lateral Lower Leg: Other: - RTD Wound #3 Left,Proximal,Anterior Lower Leg: Other: - RTD Secondary Dressing: Wound #1 Left,Medial Lower Leg: ABD pad Dry Gauze Wound #2 Right,Lateral Lower Leg: ABD pad Dry Gauze Wound #3 Left,Proximal,Anterior Lower Leg: Dry Gauze Dressing Change Frequency: Wound #1 Left,Medial Lower Leg: Change dressing every week Wound #2 Right,Lateral Lower Leg: Change dressing every week Follow-up Appointments: Wound #1 Left,Medial Lower Leg: Return Appointment in 1 week. Wound #2 Right,Lateral Lower Leg: Return Appointment in 1 week. Edema Control: Wound #1 Left,Medial Lower Leg: 3 Layer Compression System - Bilateral - unna to anchor Wound #2 Right,Lateral Lower Leg: 3 Layer Compression System - Bilateral - unna to anchor Wound #3 Left,Proximal,Anterior Lower Leg: 3 Layer Compression System - Bilateral - unna to anchor Additional Orders / Instructions: Wound #1 Left,Medial Lower Leg: Increase protein intake. Simerson,  Telesa C. ([id number]) Activity as tolerated Wound #2 Right,Lateral Lower Leg: Increase protein intake. Activity as tolerated Wound #3 Left,Proximal,Anterior Lower Leg: Increase protein intake. Activity as tolerated We used RTD to all wound areas. Her edema is well controlled. Has external coompression pumps at home Electronic Signature(s) Signed: 12/27/2015 12:38:30 PM By: [name] MD Entered By: [name] on 12/27/2015 07:46:28 Sterry, [name] ([id number]) -------------------------------------------------------------------------------- SuperBill Details Patient Name: [name] C. Date of Service: 12/26/2015 Medical Record Patient Account Number: [id number] Number: Treating RN: [name] [birth year] (80 y.o. Other Clinician: Date of Birth/Sex: Female) Treating Jahnavi Muratore Primary Care Physician/Extender: Claudette Laws Physician: Suella [place] in Treatment: 4 Referring Physician: [name] Diagnosis Coding ICD-10 Codes Code Description Chronic venous hypertension (idiopathic) with ulcer and inflammation of bilateral lower I87.333 extremity I89.0 Lymphedema, not elsewhere classified [id number] Chronic systolic (congestive) heart failure S81.812A Laceration without foreign body, left lower leg, initial encounter Facility Procedures CPT4: Description Modifier Quantity Code [id number] - DEB SUBQ TISSUE 20 SQ CM/< 1 ICD-10 Description Diagnosis I87.333 Chronic venous hypertension (idiopathic) with ulcer and inflammation of bilateral lower extremity Physician Procedures CPT4: Description Modifier Quantity Code [id number] - WC PHYS SUBQ TISS 20 SQ CM 1 ICD-10 Description Diagnosis I87.333 Chronic venous hypertension (idiopathic) with ulcer and inflammation of bilateral lower extremity Electronic Signature(s) Signed: 12/27/2015 12:38:30 PM By: [name] MD Entered By: [name] on 12/27/2015 07:46:54

## 2015-12-28 NOTE — Progress Notes (Signed)
Daily Session Note  Patient Details  Name: [name] MRN: [id number] Date of Birth: [birth year] Referring Provider:    Encounter Date: 12/28/2015  Check In:     Session Check In - 12/28/15 1701    Check-In   Location [facility]   Staff Present Heath Lark, RN, BSN, CCRP;Jaiven Graveline, DPT, Burlene Arnt, BA, ACSM CEP, Exercise Physiologist   Supervising physician immediately available to respond to emergencies See telemetry face sheet for immediately available ER MD   Medication changes reported     No   Fall or balance concerns reported    No   Warm-up and Cool-down Performed on first and last piece of equipment   Resistance Training Performed Yes   VAD Patient? No   Pain Assessment   Currently in Pain? No/denies   Multiple Pain Sites No         Goals Met:  Independence with exercise equipment Exercise tolerated well No report of cardiac concerns or symptoms  Goals Unmet:  Not Applicable  Comments: Patient completed exercise prescription and all exercise goals during rehab session. The exercise was tolerated well and the patient is progressing in the program.    Dr. [name] is Medical Director for [name] and [facility].

## 2016-01-01 ENCOUNTER — Encounter: Payer: Medicare Other | Attending: Cardiovascular Disease | Admitting: *Deleted

## 2016-01-01 ENCOUNTER — Ambulatory Visit: Payer: Medicare Other

## 2016-01-01 DIAGNOSIS — I890 Lymphedema, not elsewhere classified: Secondary | ICD-10-CM | POA: Insufficient documentation

## 2016-01-01 DIAGNOSIS — I87333 Chronic venous hypertension (idiopathic) with ulcer and inflammation of bilateral lower extremity: Secondary | ICD-10-CM | POA: Insufficient documentation

## 2016-01-01 DIAGNOSIS — Z9889 Other specified postprocedural states: Secondary | ICD-10-CM | POA: Diagnosis not present

## 2016-01-01 DIAGNOSIS — Z79899 Other long term (current) drug therapy: Secondary | ICD-10-CM | POA: Insufficient documentation

## 2016-01-01 DIAGNOSIS — I5042 Chronic combined systolic (congestive) and diastolic (congestive) heart failure: Secondary | ICD-10-CM | POA: Diagnosis not present

## 2016-01-01 DIAGNOSIS — Z7901 Long term (current) use of anticoagulants: Secondary | ICD-10-CM | POA: Diagnosis not present

## 2016-01-01 NOTE — Progress Notes (Signed)
Daily Session Note  Patient Details  Name: [name] MRN: [id number] Date of Birth: 10/16/1934 Referring Provider:    Encounter Date: 01/01/2016  Check In:     Session Check In - 01/01/16 1710    Check-In   Location [facility]   Staff Present Earlean Shawl, BS, ACSM CEP, Exercise Physiologist;Amanda Oletta Darter, BA, ACSM CEP, Exercise Physiologist;Diane Joya Gaskins, RN, BSN   Supervising physician immediately available to respond to emergencies See telemetry face sheet for immediately available ER MD   Medication changes reported     No   Fall or balance concerns reported    No   Warm-up and Cool-down Performed on first and last piece of equipment   Resistance Training Performed Yes   VAD Patient? No   Pain Assessment   Currently in Pain? No/denies   Multiple Pain Sites No         Goals Met:  Proper associated with RPD/PD & O2 Sat Independence with exercise equipment Exercise tolerated well No report of cardiac concerns or symptoms Strength training completed today  Goals Unmet:  Not Applicable  Comments: Patient completed exercise prescription and all exercise goals during rehab session. The exercise was tolerated well and the patient is progressing in the program.     Dr. [name] is Medical Director for [place] [facility].

## 2016-01-03 ENCOUNTER — Ambulatory Visit: Payer: Medicare Other

## 2016-01-03 ENCOUNTER — Encounter: Payer: Medicare Other | Admitting: Internal Medicine

## 2016-01-03 DIAGNOSIS — I5042 Chronic combined systolic (congestive) and diastolic (congestive) heart failure: Secondary | ICD-10-CM | POA: Diagnosis not present

## 2016-01-04 ENCOUNTER — Ambulatory Visit: Payer: Medicare Other

## 2016-01-04 NOTE — Progress Notes (Signed)
IJAH, VALOIS ([id number]) Visit Report for 01/03/2016 Chief Complaint Document Details YANI, RUNKEL Date of Service: 01/03/2016 1:30 PM Patient Name: C. Patient Account Number: [id number] Medical Record Treating RN: [name] [id number] Number: Other Clinician: 03-[name]-1935 (81 y.o. Treating [name], [name] Date of Birth/Sex: Female) Physician/Extender: G Primary Care [name] Physician: Referring Physician: [name] in Treatment: 6 Information Obtained from: Patient Chief Complaint Patient is here for review of bilateral lower extremity wounds dating back to December 2016 Electronic Signature(s) Signed: 01/04/2016 7:30:59 AM By: [name] MD Entered By: [name] on 01/03/2016 14:16:50 [place], [name] ([id number]) -------------------------------------------------------------------------------- Debridement Details [name] Date of Service: 01/03/2016 1:30 PM Patient Name: C. Patient Account Number: [id number] Medical Record Treating RN: [name] [id number] Number: Other Clinician: [birth year] (81 y.o. Treating [name], [name] Date of Birth/Sex: Female) Physician/Extender: G Primary Care [name] Physician: Referring Physician: [name] in Treatment: 6 Debridement Performed for Wound #1 Left,Medial Lower Leg Assessment: Performed By: Physician [name], MD Debridement: Debridement Pre-procedure Yes Verification/Time Out Taken: Start Time: 14:10 Pain Control: Other : lidocaine 4% cream Level: Skin/Subcutaneous Tissue Total Area Debrided (L x 0.8 (cm) x 0.5 (cm) = 0.4 (cm) W): Tissue and other Viable, Non-Viable, Exudate, Fibrin/Slough, Subcutaneous material debrided: Instrument: Curette Bleeding: Minimum Hemostasis Achieved: Pressure End Time: 14:11 Procedural Pain: 0 Post Procedural Pain: 0 Response to Treatment: Procedure was tolerated well Post Debridement Measurements of Total  Wound Length: (cm) 0.8 Width: (cm) 0.5 Depth: (cm) 0.2 Volume: (cm) 0.063 Post Procedure Diagnosis Same as Pre-procedure Electronic Signature(s) Signed: 01/03/2016 5:31:40 PM By: [name] Signed: 01/04/2016 7:30:59 AM By: [name] MD Entered By: [name] on 01/03/2016 14:16:05 [place], [name] ([id number]) [place], [name] ([id number]) -------------------------------------------------------------------------------- Debridement Details [name] Date of Service: 01/03/2016 1:30 PM Patient Name: C. Patient Account Number: [id number] Medical Record Treating RN: [name] [id number] Number: Other Clinician: 17-[name]-1935 (81 y.o. Treating [name], [name] Date of Birth/Sex: Female) Physician/Extender: G Primary Care [name] Physician: Referring Physician: [name] in Treatment: 6 Debridement Performed for Wound #2 Right,Lateral Lower Leg Assessment: Performed By: Physician [name], MD Debridement: Debridement Pre-procedure Yes Verification/Time Out Taken: Start Time: 14:12 Pain Control: Other : lidocaine 4% cream Level: Skin/Subcutaneous Tissue Total Area Debrided (L x 1.6 (cm) x 1 (cm) = 1.6 (cm) W): Tissue and other Viable, Non-Viable, Exudate, Fibrin/Slough, Subcutaneous material debrided: Instrument: Curette Bleeding: Minimum Hemostasis Achieved: Pressure End Time: 14:13 Procedural Pain: 0 Post Procedural Pain: 0 Response to Treatment: Procedure was tolerated well Post Debridement Measurements of Total Wound Length: (cm) 1.6 Width: (cm) 1 Depth: (cm) 0.3 Volume: (cm) 0.377 Post Procedure Diagnosis Same as Pre-procedure Electronic Signature(s) Signed: 01/03/2016 5:31:40 PM By: [name] Signed: 01/04/2016 7:30:59 AM By: [name] MD Entered By: [name] on 01/03/2016 14:16:19 [place], [name] ([id number]) [place], [name]  ([id number]) -------------------------------------------------------------------------------- Debridement Details [name] Date of Service: 01/03/2016 1:30 PM Patient Name: C. Patient Account Number: [id number] Medical Record Treating RN: [name] [id number] Number: Other Clinician: 1934-11-26 (81 y.o. Treating [name], Millen Date of Birth/Sex: Female) Physician/Extender: G Primary Care [name] Physician: Referring Physician: [name] in Treatment: 6 Debridement Performed for Wound #3 Left,Proximal,Anterior Lower Leg Assessment: Performed By: Physician [name], MD Debridement: Debridement Pre-procedure Yes Verification/Time Out Taken: Start Time: 14:07 Pain Control: Other : lidocaine 4% cream Level: Skin/Subcutaneous Tissue Total Area Debrided (L x 3 (cm) x 0.8 (cm) = 2.4 (cm) W): Tissue and other Viable, Non-Viable,  Exudate, Fibrin/Slough, Subcutaneous material debrided: Instrument: Curette Bleeding: Minimum Hemostasis Achieved: Pressure End Time: 14:09 Procedural Pain: 0 Post Procedural Pain: 0 Response to Treatment: Procedure was tolerated well Post Debridement Measurements of Total Wound Length: (cm) 2.6 Width: (cm) 1 Depth: (cm) 0.1 Volume: (cm) 0.204 Post Procedure Diagnosis Same as Pre-procedure Electronic Signature(s) Signed: 01/03/2016 5:31:40 PM By: [name] Signed: 01/04/2016 7:30:59 AM By: [name] MD Entered By: [name] on 01/03/2016 14:16:30 [place], [name] ([id number]) 8, [name] ([id number]) -------------------------------------------------------------------------------- HPI Details [name] Date of Service: 01/03/2016 1:30 PM Patient Name: C. Patient Account Number: [id number] Medical Record Treating RN: [name] [id number] Number: Other Clinician: 09-Nov-1934 (81 y.o. Treating [name], [place] Date of Birth/Sex: Female) Physician/Extender: G [place] Physician: Referring Physician: [name] in Treatment: 6 History of Present Illness HPI Description: 11/22/15; this is Getting is a 81 year old woman who lives at home on her own. According to the patient and her daughter was present she has had long-standing edema in her legs dating back many years. She also has a history of chronic systolic heart failure, atrial fibrillation and is status post mitral valve replacement. [facility] showed an ejection fraction of 40-45% she is on Lasix 60 mg a day and spironolactone 25 mg a day. Her current problem began in December around Christmas time she developed a small hematoma in the medial part of her left leg which rapidly expanded to a very large hematoma that required surgical debridement. This situation was complicated by the fact that the patient is on long-standing Coumadin for mechanical heart valve. She went to the OR had this evacuated on January 4 17. The wound has gradually improved however she has developed a small wounds around this area and more recently a wound on the right lateral leg. She is weeping edema fluid. The patient is already been to see vascular surgery. It was recommended that she wear Unna boots, she did not tolerate this due to pain in the left leg. She was then prescribed Juzo stockings and really can't get these on herself although truthfully there is probably too much edema for a Juzo stockings currently. She is not a diabetic and has no history of PAD or claudication that I could elicit. She does not use the external compression pumps reliably. She comes today with notes from her primary physician and Dr. Ronalee Belts both recommending various forms of compression but the patient does not really complied with them. Has been using the external compression pumps with some regularity but certainly not daily on the right leg and this has helped. I also note that her  daughter tells me the history that she does not sleep in bed at home. She has a hospital bed but with her legs up she finds this painful so she sleeps in the couch sitting up with her legs dependent. 11/29/15; the patient is arrives today accompanied by her son. He expresses satisfaction that she is maintained the compression all week. 12/06/15; the patient has kept her Profore light wraps on, we have good edema control no major change in the wounds we have been using Aquacel 12/13/15; changed to RTD last week. One of the 3 wounds on her left medial leg is healed she has 2 remaining wounds here and one on the right lateral leg. 12/18/2015 -- the patient was at Dr. Nino Parsley office today and he was seeing her for an arterial study. While the wrap was being removed  she had an inadvertent laceration of the left proximal anterior leg which bled quite profusely and a compression dressing was applied over this and the patient was here to get her Profore wraps done. I was asked to see the patient to make an assessment and treat appropriately. 12/26/15; the patient's injury on the left proximal leg and Steri-Strips removed after soaking. There is an open area here. The original wounds to still open on the right lateral and left medial leg. 01/03/16 patient's injury on her proximal left leg looks quite good. Still small open area on the medial left leg which appears to be improving. The area on the right lateral leg still substantially open with no real Weyenberg, Jamice C. ([id number]) improvement in wound depth. Her edema control is marginal with a Profore light. We have been using RTD for 3-4 weeks without any major change Electronic Signature(s) Signed: 01/04/2016 7:30:59 AM By: [name] MD Entered By: [name] on 01/03/2016 14:17:55 [place], [name] ([id number]) -------------------------------------------------------------------------------- Physical Exam Details [name] Date  of Service: 01/03/2016 1:30 PM Patient Name: C. Patient Account Number: [id number] Medical Record Treating RN: [name] [id number] Number: Other Clinician: 06-18-1935 (81 y.o. Treating [name], [name] Date of Birth/Sex: Female) Physician/Extender: G Primary Care [name] Physician: Referring Physician: [name] in Treatment: 6 Notes Wound exam; the area on the right lateral leg continues to be a difficult wound. It is not changed much in depth and still is a difficult debridement. The area on the left medial leg continues to improve and the subsequent scissor injury on the left anterior leg continues to improve every week Electronic Signature(s) Signed: 01/04/2016 7:30:59 AM By: [name] MD Entered By: [name] on 01/03/2016 14:19:07 [name] ([id number]) -------------------------------------------------------------------------------- Physician Orders Details [name] Date of Service: 01/03/2016 1:30 PM Patient Name: C. Patient Account Number: [id number] Medical Record Treating RN: [name] [id number] Number: Other Clinician: Sep 27, 1934 (81 y.o. Treating [name], [place] Date of Birth/Sex: Female) Physician/Extender: G Primary Care [name] Physician: Referring Physician: [name] in Treatment: 6 Verbal / Phone Orders: Yes Clinician: Carolyne Fiscal, Debi Read Back and Verified: Yes Diagnosis Coding Wound Cleansing Wound #1 Left,Medial Lower Leg o May shower with protection. o No tub bath. Wound #2 Right,Lateral Lower Leg o May shower with protection. o No tub bath. Wound #3 Left,Proximal,Anterior Lower Leg o May shower with protection. o May shower with protection. o No tub bath. o No tub bath. Anesthetic Wound #1 Left,Medial Lower Leg o Topical Lidocaine 4% cream applied to wound bed prior to debridement Wound #2 Right,Lateral Lower Leg o Topical Lidocaine 4% cream applied to  wound bed prior to debridement Wound #3 Left,Proximal,Anterior Lower Leg o Topical Lidocaine 4% cream applied to wound bed prior to debridement o Topical Lidocaine 4% cream applied to wound bed prior to debridement Skin Barriers/Peri-Wound Care Wound #1 Left,Medial Lower Leg o Barrier cream Wound #2 Right,Lateral Lower Leg o Barrier cream MAHATHI, WIECHMAN C. ([id number]) Wound #3 Left,Proximal,Anterior Lower Leg o Barrier cream Primary Wound Dressing Wound #1 Left,Medial Lower Leg o Prisma Ag Wound #2 Right,Lateral Lower Leg o Prisma Ag Wound #3 Left,Proximal,Anterior Lower Leg o Prisma Ag Secondary Dressing Wound #1 Left,Medial Lower Leg o ABD pad o Dry Gauze Wound #2 Right,Lateral Lower Leg o ABD pad o Dry Gauze Wound #3 Left,Proximal,Anterior Lower Leg o Dry Gauze Dressing Change Frequency Wound #1 Left,Medial Lower Leg o Change dressing every week Wound #2 Right,Lateral Lower Leg o Change dressing every week  Follow-up Appointments Wound #1 Left,Medial Lower Leg o Return Appointment in 1 week. Wound #2 Right,Lateral Lower Leg o Return Appointment in 1 week. Edema Control Wound #1 Left,Medial Lower Leg o 4 Layer Compression System - Bilateral - unna to anchor Wound #2 Right,Lateral Lower Leg o 4 Layer Compression System - Bilateral - unna to anchor Creeden, Caelyn C. ([id number]) Wound #3 Left,Proximal,Anterior Lower Leg o 4 Layer Compression System - Bilateral Additional Orders / Instructions Wound #1 Left,Medial Lower Leg o Increase protein intake. o Activity as tolerated Wound #2 Right,Lateral Lower Leg o Increase protein intake. o Activity as tolerated Wound #3 Left,Proximal,Anterior Lower Leg o Increase protein intake. o Activity as tolerated Electronic Signature(s) Signed: 01/03/2016 5:31:40 PM By: [name] Signed: 01/04/2016 7:30:59 AM By: [name] MD Entered By: [name] on  01/03/2016 14:16:36 [place], [name] ([id number]) -------------------------------------------------------------------------------- Problem List Details [name] Date of Service: 01/03/2016 1:30 PM Patient Name: C. Patient Account Number: [id number] Medical Record Treating RN: [name] [id number] Number: Other Clinician: [birth year] (81 y.o. Treating [name], [name] Date of Birth/Sex: Female) Physician/Extender: G Primary Care [name] Physician: Referring Physician: [name] in Treatment: 6 Active Problems ICD-10 Encounter Code Description Active Date Diagnosis I87.333 Chronic venous hypertension (idiopathic) with ulcer and 11/22/2015 Yes inflammation of bilateral lower extremity I89.0 Lymphedema, not elsewhere classified 11/22/2015 Yes [id number] Chronic systolic (congestive) heart failure 11/22/2015 Yes S81.812A Laceration without foreign body, left lower leg, initial 12/18/2015 Yes encounter Inactive Problems Resolved Problems Electronic Signature(s) Signed: 01/04/2016 7:30:59 AM By: [name] MD Entered By: [name] on 01/03/2016 14:15:47 [place], [name] ([id number]) -------------------------------------------------------------------------------- Progress Note Details [name] Date of Service: 01/03/2016 1:30 PM Patient Name: C. Patient Account Number: [id number] Medical Record Treating RN: [name] [id number] Number: Other Clinician: 1935-04-26 (81 y.o. Treating [name], [place] Date of Birth/Sex: Female) Physician/Extender: G Primary Care [name] Physician: Referring Physician: [name] in Treatment: 6 Subjective Chief Complaint Information obtained from Patient Patient is here for review of bilateral lower extremity wounds dating back to December 2016 History of Present Illness (HPI) 11/22/15; this is [place] is a 81 year old woman who lives at home on her own. According to the patient  and her daughter was present she has had long-standing edema in her legs dating back many years. She also has a history of chronic systolic heart failure, atrial fibrillation and is status post mitral valve replacement. Last echocardiogram I [facility] showed an ejection fraction of 40-45% she is on Lasix 60 mg a day and spironolactone 25 mg a day. Her current problem began in December around Christmas time she developed a small hematoma in the medial part of her left leg which rapidly expanded to a very large hematoma that required surgical debridement. This situation was complicated by the fact that the patient is on long-standing Coumadin for mechanical heart valve. She went to the OR had this evacuated on January 4 17. The wound has gradually improved however she has developed a small wounds around this area and more recently a wound on the right lateral leg. She is weeping edema fluid. The patient is already been to see vascular surgery. It was recommended that she wear Unna boots, she did not tolerate this due to pain in the left leg. She was then prescribed Juzo stockings and really can't get these on herself although truthfully there is probably too much edema for a Juzo stockings currently. She is not a diabetic and has no history of PAD  or claudication that I could elicit. She does not use the external compression pumps reliably. She comes today with notes from her primary physician and Dr. Ronalee Belts both recommending various forms of compression but the patient does not really complied with them. Has been using the external compression pumps with some regularity but certainly not daily on the right leg and this has helped. I also note that her daughter tells me the history that she does not sleep in bed at home. She has a hospital bed but with her legs up she finds this painful so she sleeps in the couch sitting up with her legs dependent. 11/29/15; the patient is arrives today  accompanied by her son. He expresses satisfaction that she is maintained the compression all week. 12/06/15; the patient has kept her Profore light wraps on, we have good edema control no major change in the wounds we have been using Aquacel 12/13/15; changed to RTD last week. One of the 3 wounds on her left medial leg is healed she has 2 remaining wounds here and one on the right lateral leg. 12/18/2015 -- the patient was at Dr. Nino Parsley office today and he was seeing her for an arterial study. While the wrap was being removed she had an inadvertent laceration of the left proximal anterior leg which bled quite profusely and a compression dressing was applied over this and the patient was here to get her Profore wraps done. I was asked to see the patient to make an assessment and treat appropriately. LEANN, REYNEN ([id number]) 12/26/15; the patient's injury on the left proximal leg and Steri-Strips removed after soaking. There is an open area here. The original wounds to still open on the right lateral and left medial leg. 01/03/16 patient's injury on her proximal left leg looks quite good. Still small open area on the medial left leg which appears to be improving. The area on the right lateral leg still substantially open with no real improvement in wound depth. Her edema control is marginal with a Profore light. We have been using RTD for 3-4 weeks without any major change Objective Integumentary (Hair, Skin) Wound #1 status is Open. Original cause of wound was Gradually Appeared. The wound is located on the Left,Medial Lower Leg. The wound measures 0.8cm length x 0.5cm width x 0.2cm depth; 0.314cm^2 area and 0.063cm^3 volume. The wound is limited to skin breakdown. There is no tunneling or undermining noted. There is a large amount of serosanguineous drainage noted. The wound margin is distinct with the outline attached to the wound base. There is medium (34-66%) red granulation within the  wound bed. There is a medium (34-66%) amount of necrotic tissue within the wound bed including Adherent Slough. The periwound skin appearance exhibited: Moist. The periwound skin appearance did not exhibit: Callus, Crepitus, Excoriation, Fluctuance, Friable, Induration, Localized Edema, Rash, Scarring, Dry/Scaly, Maceration, Atrophie Blanche, Cyanosis, Ecchymosis, Hemosiderin Staining, Mottled, Pallor, Rubor, Erythema. Periwound temperature was noted as No Abnormality. The periwound has tenderness on palpation. Wound #2 status is Open. Original cause of wound was Gradually Appeared. The wound is located on the Right,Lateral Lower Leg. The wound measures 1.6cm length x 1cm width x 0.3cm depth; 1.257cm^2 area and 0.377cm^3 volume. The wound is limited to skin breakdown. There is no tunneling or undermining noted. There is a large amount of serosanguineous drainage noted. The wound margin is distinct with the outline attached to the wound base. There is medium (34-66%) pink, pale granulation within the wound bed. There is  a medium (34-66%) amount of necrotic tissue within the wound bed including Adherent Slough. The periwound skin appearance exhibited: Localized Edema, Moist. The periwound skin appearance did not exhibit: Callus, Crepitus, Excoriation, Fluctuance, Friable, Induration, Rash, Scarring, Dry/Scaly, Maceration, Atrophie Blanche, Cyanosis, Ecchymosis, Hemosiderin Staining, Mottled, Pallor, Rubor, Erythema. Periwound temperature was noted as No Abnormality. The periwound has tenderness on palpation. Wound #3 status is Open. Original cause of wound was Trauma. The wound is located on the Left,Proximal,Anterior Lower Leg. The wound measures 3cm length x 0.8cm width x 0.1cm depth; 1.885cm^2 area and 0.188cm^3 volume. The wound is limited to skin breakdown. There is no tunneling or undermining noted. There is a large amount of serosanguineous drainage noted. The wound margin is flat and  intact. There is medium (34-66%) red, pink granulation within the wound bed. There is a medium (34- 66%) amount of necrotic tissue within the wound bed. The periwound skin appearance exhibited: Localized Edema, Moist. Periwound temperature was noted as No Abnormality. The periwound has tenderness on palpation. SHERRILL, MCNALLEY ([id number]) Assessment Active Problems ICD-10 [phone] - Chronic venous hypertension (idiopathic) with ulcer and inflammation of bilateral lower extremity I89.0 - Lymphedema, not elsewhere classified [id number] - Chronic systolic (congestive) heart failure [id number] - Laceration without foreign body, left lower leg, initial encounter Procedures Wound #1 Wound #1 is a Lymphedema located on the Left,Medial Lower Leg . There was a Skin/Subcutaneous Tissue Debridement [id number]) debridement with total area of 0.4 sq cm performed by [name], MD. with the following instrument(s): Curette to remove Viable and Non-Viable tissue/material including Exudate, Fibrin/Slough, and Subcutaneous after achieving pain control using Other (lidocaine 4% cream). A time out was conducted prior to the start of the procedure. A Minimum amount of bleeding was controlled with Pressure. The procedure was tolerated well with a pain level of 0 throughout and a pain level of 0 following the procedure. Post Debridement Measurements: 0.8cm length x 0.5cm width x 0.2cm depth; 0.063cm^3 volume. Post procedure Diagnosis Wound #1: Same as Pre-Procedure Wound #2 Wound #2 is a Lymphedema located on the Right,Lateral Lower Leg . There was a Skin/Subcutaneous Tissue Debridement [id number]) debridement with total area of 1.6 sq cm performed by [name], MD. with the following instrument(s): Curette to remove Viable and Non-Viable tissue/material including Exudate, Fibrin/Slough, and Subcutaneous after achieving pain control using Other (lidocaine 4% cream). A time out was conducted  prior to the start of the procedure. A Minimum amount of bleeding was controlled with Pressure. The procedure was tolerated well with a pain level of 0 throughout and a pain level of 0 following the procedure. Post Debridement Measurements: 1.6cm length x 1cm width x 0.3cm depth; 0.377cm^3 volume. Post procedure Diagnosis Wound #2: Same as Pre-Procedure Wound #3 Wound #3 is a Trauma, Other located on the Left,Proximal,Anterior Lower Leg . There was a Skin/Subcutaneous Tissue Debridement [id number]) debridement with total area of 2.4 sq cm performed by [name], MD. with the following instrument(s): Curette to remove Viable and Non-Viable tissue/material including Exudate, Fibrin/Slough, and Subcutaneous after achieving pain control using Other (lidocaine 4% cream). A time out was conducted prior to the start of the procedure. A Minimum amount of bleeding was controlled with Pressure. The procedure was tolerated well with a pain level of 0 throughout Karner, Francene C. ([id number]) and a pain level of 0 following the procedure. Post Debridement Measurements: 2.6cm length x 1cm width x 0.1cm depth; 0.204cm^3 volume. Post procedure Diagnosis Wound #3: Same as Pre-Procedure Plan Wound Cleansing:  Wound #1 Left,Medial Lower Leg: May shower with protection. No tub bath. Wound #2 Right,Lateral Lower Leg: May shower with protection. No tub bath. Wound #3 Left,Proximal,Anterior Lower Leg: May shower with protection. May shower with protection. No tub bath. No tub bath. Anesthetic: Wound #1 Left,Medial Lower Leg: Topical Lidocaine 4% cream applied to wound bed prior to debridement Wound #2 Right,Lateral Lower Leg: Topical Lidocaine 4% cream applied to wound bed prior to debridement Wound #3 Left,Proximal,Anterior Lower Leg: Topical Lidocaine 4% cream applied to wound bed prior to debridement Topical Lidocaine 4% cream applied to wound bed prior to debridement Skin  Barriers/Peri-Wound Care: Wound #1 Left,Medial Lower Leg: Barrier cream Wound #2 Right,Lateral Lower Leg: Barrier cream Wound #3 Left,Proximal,Anterior Lower Leg: Barrier cream Primary Wound Dressing: Wound #1 Left,Medial Lower Leg: Prisma Ag Wound #2 Right,Lateral Lower Leg: Prisma Ag Wound #3 Left,Proximal,Anterior Lower Leg: Prisma Ag Secondary Dressing: Wound #1 Left,Medial Lower Leg: ABD pad Dry Gauze Wound #2 Right,Lateral Lower Leg: Watford, Shaely C. ([id number]) ABD pad Dry Gauze Wound #3 Left,Proximal,Anterior Lower Leg: Dry Gauze Dressing Change Frequency: Wound #1 Left,Medial Lower Leg: Change dressing every week Wound #2 Right,Lateral Lower Leg: Change dressing every week Follow-up Appointments: Wound #1 Left,Medial Lower Leg: Return Appointment in 1 week. Wound #2 Right,Lateral Lower Leg: Return Appointment in 1 week. Edema Control: Wound #1 Left,Medial Lower Leg: 4 Layer Compression System - Bilateral - unna to anchor Wound #2 Right,Lateral Lower Leg: 4 Layer Compression System - Bilateral - unna to anchor Wound #3 Left,Proximal,Anterior Lower Leg: 4 Layer Compression System - Bilateral Additional Orders / Instructions: Wound #1 Left,Medial Lower Leg: Increase protein intake. Activity as tolerated Wound #2 Right,Lateral Lower Leg: Increase protein intake. Activity as tolerated Wound #3 Left,Proximal,Anterior Lower Leg: Increase protein intake. Activity as tolerated #1 I've changed to Prisma and increased her compression to Profore. She is seen vascular surgery previously and they suggested Unna boots, I'm therefore confident she can tolerate Profore wraps. Electronic Signature(s) Signed: 01/04/2016 7:30:59 AM By: [name] MD Entered By: [name] on 01/03/2016 14:20:00 Massimo, [name] ([id number]) -------------------------------------------------------------------------------- SuperBill Details Patient Name: [name] Date of Service: 01/03/2016 Medical Record Patient Account Number: [id number] Number: Treating RN: [name] Sep 22, 1934 (80 y.o. Other Clinician: Date of Birth/Sex: Female) Treating [name], [name] Primary Care Physician/Extender: Claudette Laws Physician: Suella Grove in Treatment: 6 Referring Physician: [name] Diagnosis Coding ICD-10 Codes Code Description Chronic venous hypertension (idiopathic) with ulcer and inflammation of bilateral lower I87.333 extremity I89.0 Lymphedema, not elsewhere classified [id number] Chronic systolic (congestive) heart failure S81.812A Laceration without foreign body, left lower leg, initial encounter Facility Procedures CPT4: Description Modifier Quantity Code [id number] - DEB SUBQ TISSUE 20 SQ CM/< 1 ICD-10 Description Diagnosis I87.333 Chronic venous hypertension (idiopathic) with ulcer and inflammation of bilateral lower extremity Physician Procedures CPT4: Description Modifier Quantity Code [id number] - WC PHYS SUBQ TISS 20 SQ CM 1 ICD-10 Description Diagnosis I87.333 Chronic venous hypertension (idiopathic) with ulcer and inflammation of bilateral lower extremity Electronic Signature(s) Signed: 01/04/2016 7:30:59 AM By: [name] MD Entered By: [name] on 01/03/2016 14:20:23

## 2016-01-04 NOTE — Progress Notes (Signed)
[name], [name] ([id number]) Visit Report for 12/13/2015 Arrival Information Details Patient Name: [name], [name]. Date of Service: 12/13/2015 3:00 PM Medical Record Patient Account Number: [id number] Number: Treating RN: [name] 08/27/1934 (80 y.o. Other Clinician: Date of Birth/Sex: Female) Treating [name], [name] Primary Care Physician/Extender: Claudette Laws Physician: Referring Physician: [name] in Treatment: 3 Visit Information History Since Last Visit All ordered tests and consults were completed: No Patient Arrived: Ambulatory Added or deleted any medications: No Arrival Time: 15:30 Any new allergies or adverse reactions: No Accompanied By: daughter Had a fall or experienced change in No Transfer Assistance: None activities of daily living that may affect Patient Identification Verified: Yes risk of falls: Secondary Verification Yes Signs or symptoms of abuse/neglect since last No Process Completed: visito Patient Requires No Hospitalized since last visit: No Transmission-Based Pain Present Now: Yes Precautions: Patient Has Alerts: Yes Patient Alerts: Patient on Blood Thinner [place] Signature(s) Signed: 12/13/2015 5:18:41 PM By: [name] Entered By: [name] on 12/13/2015 15:32:22 [name] ([id number]) -------------------------------------------------------------------------------- Encounter Discharge Information Details Patient Name: Carrie Mulligan C. Date of Service: 12/13/2015 3:00 PM Medical Record Patient Account Number: [id number] Number: Treating RN: [name] 05/30/1935 (80 y.o. Other Clinician: Date of Birth/Sex: Female) Treating [name], [name] Primary Care Physician/Extender: Claudette Laws Physician: Referring Physician: [name] in Treatment: 3 Encounter Discharge Information Items Discharge Pain Level:  0 Discharge Condition: Stable Ambulatory Status: Ambulatory Discharge Destination: Home Transportation: Private Auto Accompanied By: daughter Schedule Follow-up Appointment: Yes Medication Reconciliation completed and provided to Patient/Care Yes [name]: Provided on Clinical Summary of Care: 12/13/2015 Form Type Recipient Paper Patient [place] Electronic Signature(s) Signed: 12/13/2015 4:21:35 PM By: Ruthine Dose Entered By: Ruthine Dose on 12/13/2015 16:21:34 [name] ([id number]) -------------------------------------------------------------------------------- Lower Extremity Assessment Details Patient Name: Carrie Cobia, [name] Date of Service: 12/13/2015 3:00 PM Medical Record Patient Account Number: [id number] Number: Treating RN: [name] Nov 09, 1934 (80 y.o. Other Clinician: Date of Birth/Sex: Female) Treating [name], [name] Primary Care Physician/Extender: Claudette Laws Physician: Referring Physician: [name] in Treatment: 3 Edema Assessment Assessed: [Left: No] [Right: No] Edema: [Left: Yes] [Right: Yes] Calf Left: Right: Point of Measurement: 36 cm From Medial Instep 36.8 cm 35.5 cm Ankle Left: Right: Point of Measurement: 10 cm From Medial Instep 23.5 cm 24 cm Vascular Assessment Pulses: Posterior Tibial Dorsalis Pedis Palpable: [Left:Yes] [Right:Yes] Extremity colors, hair growth, and conditions: Extremity Color: [Left:Hyperpigmented] [Right:Hyperpigmented] Temperature of Extremity: [Left:Warm] [Right:Warm] Electronic Signature(s) Signed: 12/13/2015 5:18:41 PM By: [name] Entered By: [name] on 12/13/2015 15:48:32 Lucks, [name] -------------------------------------------------------------------------------- Multi Wound Chart Details Patient Name: Carrie Mulligan C. Date of Service: 12/13/2015 3:00 PM Medical Record Patient Account Number:  [id number] Number: Treating RN: [name] 1934/07/11 (80 y.o. Other Clinician: Date of Birth/Sex: Female) Treating [name], [name] Primary Care Physician/Extender: Claudette Laws Physician: Referring Physician: [name] in Treatment: 3 Vital Signs Height(in): 68 Pulse(bpm): 50 Weight(lbs): 178 Blood Pressure 122/39 (mmHg): Body Mass Index(BMI): 27 Temperature(F): 98.0 Respiratory Rate 18 (breaths/min): Photos: [1:No Photos] [2:No Photos] [N/A:N/A] Wound Location: [1:Left Lower Leg - Medial] [2:Right Lower Leg - Lateral] [N/A:N/A] Wounding Event: [1:Gradually Appeared] [2:Gradually Appeared] [N/A:N/A] Primary Etiology: [1:Lymphedema] [2:Lymphedema] [N/A:N/A] Comorbid History: [1:Cataracts, Arrhythmia, Hypotension, Osteoarthritis] [2:Cataracts, Arrhythmia, Hypotension, Osteoarthritis] [N/A:N/A] Date Acquired: [1:07/04/2015] [2:07/04/2015] [N/A:N/A] Weeks of Treatment: [1:3] [2:3] [N/A:N/A] Wound Status: [1:Open] [2:Open] [N/A:N/A] Measurements L x W x D 8x5x0.4 [2:1.4x1.1x0.3] [N/A:N/A] (cm) Area (cm) : [1:31.416] [2:1.21] [  N/A:N/A] Volume (cm) : [1:12.566] [2:0.363] [N/A:N/A] % Reduction in Area: [1:31.60%] [2:14.40%] [N/A:N/A] % Reduction in Volume: -36.80% [2:-157.40%] [N/A:N/A] Classification: [1:Partial Thickness] [2:Partial Thickness] [N/A:N/A] Exudate Amount: [1:Large] [2:Large] [N/A:N/A] Exudate Type: [1:Serosanguineous] [2:Serosanguineous] [N/A:N/A] Exudate Color: [1:red, brown] [2:red, brown] [N/A:N/A] Wound Margin: [1:Distinct, outline attached] [2:Distinct, outline attached] [N/A:N/A] Granulation Amount: [1:Medium (34-66%)] [2:Small (1-33%)] [N/A:N/A] Granulation Quality: [1:Red] [2:Pink, Pale] [N/A:N/A] Necrotic Amount: [1:Medium (34-66%)] [2:Large (67-100%)] [N/A:N/A] Exposed Structures: [1:Fascia: No Fat: No Tendon: No] [2:Fascia: No Fat: No Tendon: No] [N/A:N/A] Muscle: No Muscle: No Joint: No Joint: No Bone: No Bone:  No Limited to Skin Limited to Skin Breakdown Breakdown Epithelialization: None None N/A Periwound Skin Texture: Edema: No Edema: Yes N/A Excoriation: No Excoriation: No Induration: No Induration: No Callus: No Callus: No Crepitus: No Crepitus: No Fluctuance: No Fluctuance: No Friable: No Friable: No Rash: No Rash: No Scarring: No Scarring: No Periwound Skin Moist: Yes Moist: Yes N/A Moisture: Maceration: No Maceration: No Dry/Scaly: No Dry/Scaly: No Periwound Skin Color: Atrophie Blanche: No Atrophie Blanche: No N/A Cyanosis: No Cyanosis: No Ecchymosis: No Ecchymosis: No Erythema: No Erythema: No Hemosiderin Staining: No Hemosiderin Staining: No Mottled: No Mottled: No Pallor: No Pallor: No Rubor: No Rubor: No Temperature: No Abnormality No Abnormality N/A Tenderness on Yes Yes N/A Palpation: Wound Preparation: Ulcer Cleansing: Ulcer Cleansing: N/A Rinsed/Irrigated with Rinsed/Irrigated with Saline Saline Topical Anesthetic Topical Anesthetic Applied: Other: lidocaine Applied: Other: lidocaine 4% 4% Treatment Notes Electronic Signature(s) Signed: 12/13/2015 5:18:41 PM By: [name] Entered By: [name] on 12/13/2015 15:52:06 Dantuono, [name] ([id number]) -------------------------------------------------------------------------------- [place] Details Patient Name: Carrie Kussmaul. Date of Service: 12/13/2015 3:00 PM Medical Record Patient Account Number: [id number] Number: Treating RN: [name] 1935-06-23 (80 y.o. Other Clinician: Date of Birth/Sex: Female) Treating [name], [name] Primary Care Physician/Extender: Claudette Laws Physician: Referring Physician: [name] in Treatment: 3 Active Inactive Orientation to the Wound Care Program Nursing Diagnoses: Knowledge deficit related to the wound healing center program Goals: Patient/caregiver will verbalize understanding of  the [place] Program Date Initiated: 11/22/2015 Goal Status: Active Interventions: Provide education on orientation to the wound center Notes: Venous Leg Ulcer Nursing Diagnoses: Knowledge deficit related to disease process and management Potential for venous Insuffiency (use before diagnosis confirmed) Goals: Patient will maintain optimal edema control Date Initiated: 11/22/2015 Goal Status: Active Patient/caregiver will verbalize understanding of disease process and disease management Date Initiated: 11/22/2015 Goal Status: Active Verify adequate tissue perfusion prior to therapeutic compression application Date Initiated: 11/22/2015 Goal Status: Active Interventions: Assess peripheral edema status every visit. BLESSINGS, NALEPA ([id number]) Compression as ordered Provide education on venous insufficiency Treatment Activities: Non-invasive vascular studies : 11/22/2015 Therapeutic compression applied : 11/22/2015 Notes: Wound/Skin Impairment Nursing Diagnoses: Impaired tissue integrity Knowledge deficit related to ulceration/compromised skin integrity Goals: Patient/caregiver will verbalize understanding of skin care regimen Date Initiated: 11/22/2015 Goal Status: Active Ulcer/skin breakdown will have a volume reduction of 30% by week 4 Date Initiated: 11/22/2015 Goal Status: Active Ulcer/skin breakdown will have a volume reduction of 50% by week 8 Date Initiated: 11/22/2015 Goal Status: Active Ulcer/skin breakdown will have a volume reduction of 80% by week 12 Date Initiated: 11/22/2015 Goal Status: Active Ulcer/skin breakdown will heal within 14 weeks Date Initiated: 11/22/2015 Goal Status: Active Interventions: Assess patient/caregiver ability to perform ulcer/skin care regimen upon admission and as needed Assess ulceration(s) every visit Provide education on ulcer and skin care Treatment Activities: Skin care regimen initiated : 11/22/2015 Topical wound  management  initiated : 11/22/2015 Notes: Electronic Signature(s) Signed: 12/13/2015 5:18:41 PM By: De Burrs, [name] ([id number]) Entered By: [name] on 12/13/2015 15:52:00 [place], [name] ([id number]) -------------------------------------------------------------------------------- Pain Assessment Details Patient Name: Carrie Mulligan C. Date of Service: 12/13/2015 3:00 PM Medical Record Patient Account Number: [id number] Number: Treating RN: [name] [birth year] (80 y.o. Other Clinician: Date of Birth/Sex: Female) Treating [name], [name] Primary Care Physician/Extender: Claudette Laws Physician: Referring Physician: [name] in Treatment: 3 Active Problems Location of Pain Severity and Description of Pain Patient Has Paino Yes Site Locations Pain Location: Pain in Ulcers With Dressing Change: Yes Duration of the Pain. Constant / Intermittento Constant Rate the pain. Current Pain Level: 5 Worst Pain Level: 2 Least Pain Level: 8 Character of Pain Describe the Pain: Burning, Throbbing Pain Management and Medication Current Pain Management: Electronic Signature(s) Signed: 12/13/2015 5:18:41 PM By: [name] Entered By: [name] on 12/13/2015 15:32:46 [place], [name] ([id number]) -------------------------------------------------------------------------------- Patient/Caregiver Education Details Patient Name: Carrie Kussmaul. Date of Service: 12/13/2015 3:00 PM Medical Record Patient Account Number: [id number] Number: Treating RN: [name] [birth year] (80 y.o. Other Clinician: Date of Birth/Gender: Female) Treating [name], [name] Primary Care Physician/Extender: Claudette Laws Physician: Suella [place] in Treatment: 3 Referring Physician: [name] Education Assessment Education Provided To: Patient Education Topics Provided Wound/Skin Impairment: Handouts: Other: do  not get wraps wet Methods: Demonstration, Explain/Verbal Responses: State content correctly Electronic Signature(s) Signed: 12/13/2015 5:18:41 PM By: [name] Entered By: [name] on 12/13/2015 16:20:45 Kaminsky, [name] ([id number]) -------------------------------------------------------------------------------- Wound Assessment Details Patient Name: Carrie Mulligan C. Date of Service: 12/13/2015 3:00 PM Medical Record Patient Account Number: [id number] Number: Treating RN: [name] May 17, 1935 (80 y.o. Other Clinician: Date of Birth/Sex: Female) Treating [name], [name] Primary Care Physician/Extender: Claudette Laws Physician: Referring Physician: [name] in Treatment: 3 Wound Status Wound Number: 1 Primary Lymphedema Etiology: Wound Location: Left Lower Leg - Medial Wound Status: Open Wounding Event: Gradually Appeared Comorbid Cataracts, Arrhythmia, Hypotension, Date Acquired: 07/04/2015 History: Osteoarthritis Weeks Of Treatment: 3 Clustered Wound: No Photos Photo Uploaded By: [name] on 12/14/2015 07:56:10 Wound Measurements Length: (cm) 8 Width: (cm) 5 Depth: (cm) 0.4 Area: (cm) 31.416 Volume: (cm) 12.566 % Reduction in Area: 31.6% % Reduction in Volume: -36.8% Epithelialization: None Tunneling: No Undermining: No Wound Description Classification: Partial Thickness Wound Margin: Distinct, outline attached Exudate Amount: Large Exudate Type: Serosanguineous Exudate Color: red, brown Foul Odor After Cleansing: No Wound Bed Granulation Amount: Medium (34-66%) Exposed Structure Granulation Quality: Red Fascia Exposed: No [name], [name] ([id number]) Necrotic Amount: Medium (34-66%) Fat Layer Exposed: No Necrotic Quality: Adherent Slough Tendon Exposed: No Muscle Exposed: No Joint Exposed: No Bone Exposed: No Limited to Skin Breakdown Periwound Skin Texture Texture Color No Abnormalities  Noted: No No Abnormalities Noted: No Callus: No Atrophie Blanche: No Crepitus: No Cyanosis: No Excoriation: No Ecchymosis: No Fluctuance: No Erythema: No Friable: No Hemosiderin Staining: No Induration: No Mottled: No Localized Edema: No Pallor: No Rash: No Rubor: No Scarring: No Temperature / Pain Moisture Temperature: No Abnormality No Abnormalities Noted: No Tenderness on Palpation: Yes Dry / Scaly: No Maceration: No Moist: Yes Wound Preparation Ulcer Cleansing: Rinsed/Irrigated with Saline Topical Anesthetic Applied: Other: lidocaine 4%, Electronic Signature(s) Signed: 12/13/2015 5:18:41 PM By: [name] Entered By: [name] on 12/13/2015 15:50:01 [name], [name] ([id number]) -------------------------------------------------------------------------------- Wound Assessment Details Patient Name: Carrie Mulligan C. Date of Service: 12/13/2015 3:00 PM Medical Record Patient Account Number: [id number] Number:  Treating RN: [name] 11-20-1934 (80 y.o. Other Clinician: Date of Birth/Sex: Female) Treating [name], [name] Primary Care Physician/Extender: Claudette Laws Physician: Referring Physician: [name] in Treatment: 3 Wound Status Wound Number: 2 Primary Lymphedema Etiology: Wound Location: Right Lower Leg - Lateral Wound Status: Open Wounding Event: Gradually Appeared Comorbid Cataracts, Arrhythmia, Hypotension, Date Acquired: 07/04/2015 History: Osteoarthritis Weeks Of Treatment: 3 Clustered Wound: No Photos Photo Uploaded By: [name] on 12/14/2015 07:56:10 Wound Measurements Length: (cm) 1.4 % Reduction i Width: (cm) 1.1 % Reduction i Depth: (cm) 0.3 Epithelializa Area: (cm) 1.21 Tunneling: Volume: (cm) 0.363 Undermining: n Area: 14.4% n Volume: -157.4% tion: None No No Wound Description Classification: Partial Thickness Wound Margin: Distinct, outline attached Exudate Amount:  Large Exudate Type: Serosanguineous Exudate Color: red, brown Foul Odor After Cleansing: No Wound Bed Granulation Amount: Small (1-33%) Exposed Structure Granulation Quality: Pink, Pale Fascia Exposed: No Crampton, [name] ([id number]) Necrotic Amount: Large (67-100%) Fat Layer Exposed: No Necrotic Quality: Adherent Slough Tendon Exposed: No Muscle Exposed: No Joint Exposed: No Bone Exposed: No Limited to Skin Breakdown Periwound Skin Texture Texture Color No Abnormalities Noted: No No Abnormalities Noted: No Callus: No Atrophie Blanche: No Crepitus: No Cyanosis: No Excoriation: No Ecchymosis: No Fluctuance: No Erythema: No Friable: No Hemosiderin Staining: No Induration: No Mottled: No Localized Edema: Yes Pallor: No Rash: No Rubor: No Scarring: No Temperature / Pain Moisture Temperature: No Abnormality No Abnormalities Noted: No Tenderness on Palpation: Yes Dry / Scaly: No Maceration: No Moist: Yes Wound Preparation Ulcer Cleansing: Rinsed/Irrigated with Saline Topical Anesthetic Applied: Other: lidocaine 4%, Electronic Signature(s) Signed: 12/13/2015 5:18:41 PM By: [name] Entered By: [name] on 12/13/2015 15:50:56 [name], [name] ([id number]) -------------------------------------------------------------------------------- [place] Details Patient Name: Carrie Mulligan C. Date of Service: 12/13/2015 3:00 PM Medical Record Patient Account Number: [id number] Number: Treating RN: [name] Mar 15, 1935 (80 y.o. Other Clinician: Date of Birth/Sex: Female) Treating [name], [name] Primary Care Physician/Extender: Claudette Laws Physician: Referring Physician: [name] in Treatment: 3 Vital Signs Time Taken: 15:32 Temperature (F): 98.0 Height (in): 68 Pulse (bpm): 50 Weight (lbs): 178 Respiratory Rate (breaths/min): 18 Body Mass Index (BMI): 27.1 Blood Pressure (mmHg): 122/39 Reference Range: 80 -  120 mg / dl Electronic Signature(s) Signed: 12/13/2015 5:18:41 PM By: [name] Entered By: [name] on 12/13/2015 15:34:41

## 2016-01-04 NOTE — Progress Notes (Signed)
[name] ([id number]) Visit Report for 01/03/2016 Arrival Information Details Patient Name: [name], [name]. Date of Service: 01/03/2016 1:30 PM Medical Record Patient Account Number: [id number] Number: Treating RN: [name] [birth year] (80 y.o. Other Clinician: Date of Birth/Sex: Female) Treating [name], [name] Primary Care Physician/Extender: Claudette Laws Physician: Referring Physician: [name] in Treatment: 6 Visit Information History Since Last Visit All ordered tests and consults were completed: No Patient Arrived: [name] Added or deleted any medications: No Arrival Time: 13:36 Any new allergies or adverse reactions: No Accompanied By: son Had a fall or experienced change in No Transfer Assistance: None activities of daily living that may affect Patient Identification Verified: Yes risk of falls: Secondary Verification Yes Signs or symptoms of abuse/neglect since last No Process Completed: visito Patient Requires No Hospitalized since last visit: No Transmission-Based Pain Present Now: No Precautions: Patient Has Alerts: Yes Patient Alerts: Patient on Blood Thinner [place]ola Signature(s) Signed: 01/03/2016 5:31:40 PM By: [name] Entered By: [name] on 01/03/2016 13:37:14 [name] ([id number]) -------------------------------------------------------------------------------- Encounter Discharge Information Details Patient Name: Carrie Mulligan C. Date of Service: 01/03/2016 1:30 PM Medical Record Patient Account Number: [id number] Number: Treating RN: [name] [birth year] (80 y.o. Other Clinician: Date of Birth/Sex: Female) Treating [name], [name] Primary Care Physician/Extender: Claudette Laws Physician: Referring Physician: [name] in Treatment: 6 Encounter Discharge Information Items Discharge Pain Level: 0 Discharge Condition:  Stable Ambulatory Status: Cane Discharge Destination: Home Transportation: Private Auto Accompanied By: son Schedule Follow-up Appointment: Yes Medication Reconciliation completed Yes and provided to Patient/Care [name]: Provided on Clinical Summary of Care: 01/03/2016 Form Type Recipient Paper Patient [place] Electronic Signature(s) Signed: 01/03/2016 2:52:24 PM By: Ruthine Dose Entered By: Ruthine Dose on 01/03/2016 14:52:23 [name] ([id number]) -------------------------------------------------------------------------------- Lower Extremity Assessment Details Patient Name: [name], [name] Date of Service: 01/03/2016 1:30 PM Medical Record Patient Account Number: [id number] Number: Treating RN: [name] [birth year] (80 y.o. Other Clinician: Date of Birth/Sex: Female) Treating [name], [name] Primary Care Physician/Extender: Claudette Laws Physician: Referring Physician: [name] in Treatment: 6 Edema Assessment Assessed: [Left: No] [Right: No] E[Left: dema] [Right: :] Calf Left: Right: Point of Measurement: 36 cm From Medial Instep 38.5 cm 38 cm Ankle Left: Right: Point of Measurement: 10 cm From Medial Instep 23.8 cm 25.5 cm Vascular Assessment Pulses: Posterior Tibial Dorsalis Pedis Palpable: [Left:Yes] [Right:Yes] Extremity colors, hair growth, and conditions: Extremity Color: [Left:Hyperpigmented] [Right:Hyperpigmented] Temperature of Extremity: [Left:Warm] [Right:Warm] Capillary Refill: [Left:< 3 seconds] [Right:< 3 seconds] Toe Nail Assessment Left: Right: Thick: No No Discolored: No Yes Deformed: No No Improper Length and Hygiene: No No Electronic Signature(s) Signed: 01/03/2016 5:31:40 PM By: [name] Entered By: [name] on 01/03/2016 13:56:20 Hallowell, [name] ([id number]) Jallow, [name]. ([id number]) -------------------------------------------------------------------------------- Multi  Wound Chart Details Patient Name: [name], [name] Date of Service: 01/03/2016 1:30 PM Medical Record Patient Account Number: [id number] Number: Treating RN: [name] 01-06-1935 (80 y.o. Other Clinician: Date of Birth/Sex: Female) Treating [name], [name] Primary Care Physician/Extender: Claudette Laws Physician: Referring Physician: [name] in Treatment: 6 Photos: [1:No Photos] [2:No Photos] [3:No Photos] Wound Location: [1:Left Lower Leg - Medial] [2:Right Lower Leg - Lateral Left Lower Leg - Anterior,] [3:Proximal] Wounding Event: [1:Gradually Appeared] [2:Gradually Appeared] [3:Trauma] Primary Etiology: [1:Lymphedema] [2:Lymphedema] [3:Trauma, Other] Comorbid History: [1:Cataracts, Arrhythmia, Hypotension, Osteoarthritis] [2:Cataracts, Arrhythmia, Hypotension, Osteoarthritis] [3:Cataracts, Arrhythmia, Hypotension, Osteoarthritis] Date Acquired: [1:07/04/2015] [2:07/04/2015] [3:12/18/2015] Weeks of Treatment: [1:6] [2:6] [3:2] Wound  Status: [1:Open] [2:Open] [3:Open] Measurements L x W x D 0.8x0.5x0.2 [2:1.6x1x0.3] [3:3x0.8x0.1] (cm) Area (cm) : [1:0.314] [2:1.257] [3:1.885] Volume (cm) : [1:0.063] [2:0.377] [3:0.188] % Reduction in Area: [1:99.30%] [2:11.10%] [3:36.90%] % Reduction in Volume: 99.30% [2:-167.40%] [3:68.50%] Classification: [1:Partial Thickness] [2:Partial Thickness] [3:Partial Thickness] Exudate Amount: [1:Large] [2:Large] [3:Large] Exudate Type: [1:Serosanguineous] [2:Serosanguineous] [3:Serosanguineous] Exudate Color: [1:red, brown] [2:red, brown] [3:red, brown] Wound Margin: [1:Distinct, outline attached] [2:Distinct, outline attached] [3:Flat and Intact] Granulation Amount: [1:Medium (34-66%)] [2:Medium (34-66%)] [3:Medium (34-66%)] Granulation Quality: [1:Red] [2:Pink, Pale] [3:Red, Pink] Necrotic Amount: [1:Medium (34-66%)] [2:Medium (34-66%)] [3:Medium (34-66%)] Exposed Structures: [1:Fascia: No Fat: No Tendon: No Muscle: No  Joint: No Bone: No Limited to Skin Breakdown] [2:Fascia: No Fat: No Tendon: No Muscle: No Joint: No Bone: No Limited to Skin Breakdown] [3:Fascia: No Fat: No Tendon: No Muscle: No Joint: No Bone: No Limited to  Skin Breakdown] Epithelialization: [1:None] [2:None] [3:None] Periwound Skin Texture: [3:Edema: Yes] Edema: No Edema: Yes Excoriation: No Excoriation: No Induration: No Induration: No Callus: No Callus: No Crepitus: No Crepitus: No Fluctuance: No Fluctuance: No Friable: No Friable: No Rash: No Rash: No Scarring: No Scarring: No Periwound Skin Moist: Yes Moist: Yes Moist: Yes Moisture: Maceration: No Maceration: No Dry/Scaly: No Dry/Scaly: No Periwound Skin Color: Atrophie Blanche: No Atrophie Blanche: No No Abnormalities Noted Cyanosis: No Cyanosis: No Ecchymosis: No Ecchymosis: No Erythema: No Erythema: No Hemosiderin Staining: No Hemosiderin Staining: No Mottled: No Mottled: No Pallor: No Pallor: No Rubor: No Rubor: No Temperature: No Abnormality No Abnormality No Abnormality Tenderness on Yes Yes Yes Palpation: Wound Preparation: Ulcer Cleansing: Ulcer Cleansing: Ulcer Cleansing: Rinsed/Irrigated with Rinsed/Irrigated with Rinsed/Irrigated with Saline Saline Saline Topical Anesthetic Topical Anesthetic Topical Anesthetic Applied: Other: lidocaine Applied: Other: lidocaine Applied: None, Other: 4% 4% lidocaine 4% Treatment Notes Electronic Signature(s) Signed: 01/03/2016 5:31:40 PM By: [name] Entered By: [name] on 01/03/2016 14:02:42 [name], [name] ([id number]) -------------------------------------------------------------------------------- [place] Details Patient Name: Carrie Mulligan C. Date of Service: 01/03/2016 1:30 PM Medical Record Patient Account Number: [id number] Number: Treating RN: [name] 08/05/1934 (80 y.o. Other Clinician: Date of Birth/Sex: Female) Treating  [name], [name] Primary Care Physician/Extender: Claudette Laws Physician: Referring Physician: [name] in Treatment: 6 Active Inactive Orientation to the Wound Care Program Nursing Diagnoses: Knowledge deficit related to the wound healing center program Goals: Patient/caregiver will verbalize understanding of the [place] Program Date Initiated: 11/22/2015 Goal Status: Active Interventions: Provide education on orientation to the wound center Notes: Venous Leg Ulcer Nursing Diagnoses: Knowledge deficit related to disease process and management Potential for venous Insuffiency (use before diagnosis confirmed) Goals: Patient will maintain optimal edema control Date Initiated: 11/22/2015 Goal Status: Active Patient/caregiver will verbalize understanding of disease process and disease management Date Initiated: 11/22/2015 Goal Status: Active Verify adequate tissue perfusion prior to therapeutic compression application Date Initiated: 11/22/2015 Goal Status: Active Interventions: Assess peripheral edema status every visit. ARAM, DEVINCENT ([id number]) Compression as ordered Provide education on venous insufficiency Treatment Activities: Non-invasive vascular studies : 11/22/2015 Therapeutic compression applied : 11/22/2015 Notes: Wound/Skin Impairment Nursing Diagnoses: Impaired tissue integrity Knowledge deficit related to ulceration/compromised skin integrity Goals: Patient/caregiver will verbalize understanding of skin care regimen Date Initiated: 11/22/2015 Goal Status: Active Ulcer/skin breakdown will have a volume reduction of 30% by week 4 Date Initiated: 11/22/2015 Goal Status: Active Ulcer/skin breakdown will have a volume reduction of 50% by week 8 Date Initiated: 11/22/2015 Goal Status: Active Ulcer/skin breakdown will have a volume reduction of 80% by  week 12 Date Initiated: 11/22/2015 Goal Status: Active Ulcer/skin breakdown will heal  within 14 weeks Date Initiated: 11/22/2015 Goal Status: Active Interventions: Assess patient/caregiver ability to perform ulcer/skin care regimen upon admission and as needed Assess ulceration(s) every visit Provide education on ulcer and skin care Treatment Activities: Skin care regimen initiated : 11/22/2015 Topical wound management initiated : 11/22/2015 Notes: Electronic Signature(s) Signed: 01/03/2016 5:31:40 PM By: De Burrs, [name] ([id number]) Entered By: [name] on 01/03/2016 14:02:35 Pocius, [name] ([id number]) -------------------------------------------------------------------------------- Pain Assessment Details Patient Name: Carrie Mulligan C. Date of Service: 01/03/2016 1:30 PM Medical Record Patient Account Number: [id number] Number: Treating RN: [name] 04-[name]-1935 (80 y.o. Other Clinician: Date of Birth/Sex: Female) Treating [name], [name] Primary Care Physician/Extender: Claudette Laws Physician: Referring Physician: [name] in Treatment: 6 Active Problems Location of Pain Severity and Description of Pain Patient Has Paino No Site Locations With Dressing Change: No Pain Management and Medication Current Pain Management: Electronic Signature(s) Signed: 01/03/2016 5:31:40 PM By: [name] Entered By: [name] on 01/03/2016 13:37:20 Murchison, [name] ([id number]) -------------------------------------------------------------------------------- Patient/Caregiver Education Details Patient Name: Carrie Kussmaul. Date of Service: 01/03/2016 1:30 PM Medical Record Patient Account Number: [id number] Number: Treating RN: [name] 10/06/1934 (80 y.o. Other Clinician: Date of Birth/Gender: Female) Treating [name], [name] Primary Care Physician/Extender: Claudette Laws Physician: Suella Grove in Treatment: 6 Referring Physician: [name] Education Assessment Education  Provided To: Patient Education Topics Provided Wound/Skin Impairment: Handouts: Other: do not get wraps wet Methods: Demonstration, Explain/Verbal Responses: State content correctly Electronic Signature(s) Signed: 01/03/2016 5:31:40 PM By: [name] Entered By: [name] on 01/03/2016 14:17:53 [name], [name] ([id number]) -------------------------------------------------------------------------------- Wound Assessment Details Patient Name: Carrie Mulligan C. Date of Service: 01/03/2016 1:30 PM Medical Record Patient Account Number: [id number] Number: Treating RN: [name] 1935/05/15 (80 y.o. Other Clinician: Date of Birth/Sex: Female) Treating [name], [name] Primary Care Physician/Extender: Claudette Laws Physician: Referring Physician: [name] in Treatment: 6 Wound Status Wound Number: 1 Primary Lymphedema Etiology: Wound Location: Left Lower Leg - Medial Wound Status: Open Wounding Event: Gradually Appeared Comorbid Cataracts, Arrhythmia, Hypotension, Date Acquired: 07/04/2015 History: Osteoarthritis Weeks Of Treatment: 6 Clustered Wound: No Photos Photo Uploaded By: [name] on 01/03/2016 17:29:53 Wound Measurements Length: (cm) 0.8 % Reduction i Width: (cm) 0.5 % Reduction i Depth: (cm) 0.2 Epithelializa Area: (cm) 0.314 Tunneling: Volume: (cm) 0.063 Undermining: n Area: 99.3% n Volume: 99.3% tion: None No No Wound Description Classification: Partial Thickness Wound Margin: Distinct, outline attached Exudate Amount: Large Exudate Type: Serosanguineous Exudate Color: red, brown Foul Odor After Cleansing: No Wound Bed Granulation Amount: Medium (34-66%) Exposed Structure Granulation Quality: Red Fascia Exposed: No Bouchillon, Pina C. ([id number]) Necrotic Amount: Medium (34-66%) Fat Layer Exposed: No Necrotic Quality: Adherent Slough Tendon Exposed: No Muscle Exposed: No Joint Exposed:  No Bone Exposed: No Limited to Skin Breakdown Periwound Skin Texture Texture Color No Abnormalities Noted: No No Abnormalities Noted: No Callus: No Atrophie Blanche: No Crepitus: No Cyanosis: No Excoriation: No Ecchymosis: No Fluctuance: No Erythema: No Friable: No Hemosiderin Staining: No Induration: No Mottled: No Localized Edema: No Pallor: No Rash: No Rubor: No Scarring: No Temperature / Pain Moisture Temperature: No Abnormality No Abnormalities Noted: No Tenderness on Palpation: Yes Dry / Scaly: No Maceration: No Moist: Yes Wound Preparation Ulcer Cleansing: Rinsed/Irrigated with Saline Topical Anesthetic Applied: Other: lidocaine 4%, Treatment Notes Wound #1 (Left, Medial Lower Leg) 1. Cleansed with: Clean wound with Normal Saline Cleanse wound  with antibacterial soap and water 2. Anesthetic Topical Lidocaine 4% cream to wound bed prior to debridement 3. Peri-wound Care: Barrier cream 4. Dressing Applied: Prisma Ag 5. Secondary Dressing Applied ABD Pad Dry Gauze 7. Secured with Tape 4 Layer Compression System - Bilateral [name], [name] ([id number]) Electronic Signature(s) Signed: 01/03/2016 5:31:40 PM By: [name] Entered By: [name] on 01/03/2016 13:57:50 Catala, [name] ([id number]) -------------------------------------------------------------------------------- Wound Assessment Details Patient Name: [name], Ahlam C. Date of Service: 01/03/2016 1:30 PM Medical Record Patient Account Number: [id number] Number: Treating RN: [name] 1934/07/20 (80 y.o. Other Clinician: Date of Birth/Sex: Female) Treating [name], [name] Primary Care Physician/Extender: Claudette Laws Physician: Referring Physician: [name] in Treatment: 6 Wound Status Wound Number: 2 Primary Lymphedema Etiology: Wound Location: Right Lower Leg - Lateral Wound Status: Open Wounding Event: Gradually Appeared Comorbid  Cataracts, Arrhythmia, Hypotension, Date Acquired: 07/04/2015 History: Osteoarthritis Weeks Of Treatment: 6 Clustered Wound: No Photos Photo Uploaded By: [name] on 01/03/2016 17:30:22 Wound Measurements Length: (cm) 1.6 % Reduction i Width: (cm) 1 % Reduction i Depth: (cm) 0.3 Epithelializa Area: (cm) 1.257 Tunneling: Volume: (cm) 0.377 Undermining: n Area: 11.1% n Volume: -167.4% tion: None No No Wound Description Classification: Partial Thickness Wound Margin: Distinct, outline attached Exudate Amount: Large Exudate Type: Serosanguineous Exudate Color: red, brown Foul Odor After Cleansing: No Wound Bed Granulation Amount: Medium (34-66%) Exposed Structure Granulation Quality: Pink, Pale Fascia Exposed: No Ercole, Levina C. ([id number]) Necrotic Amount: Medium (34-66%) Fat Layer Exposed: No Necrotic Quality: Adherent Slough Tendon Exposed: No Muscle Exposed: No Joint Exposed: No Bone Exposed: No Limited to Skin Breakdown Periwound Skin Texture Texture Color No Abnormalities Noted: No No Abnormalities Noted: No Callus: No Atrophie Blanche: No Crepitus: No Cyanosis: No Excoriation: No Ecchymosis: No Fluctuance: No Erythema: No Friable: No Hemosiderin Staining: No Induration: No Mottled: No Localized Edema: Yes Pallor: No Rash: No Rubor: No Scarring: No Temperature / Pain Moisture Temperature: No Abnormality No Abnormalities Noted: No Tenderness on Palpation: Yes Dry / Scaly: No Maceration: No Moist: Yes Wound Preparation Ulcer Cleansing: Rinsed/Irrigated with Saline Topical Anesthetic Applied: Other: lidocaine 4%, Treatment Notes Wound #2 (Right, Lateral Lower Leg) 1. Cleansed with: Clean wound with Normal Saline Cleanse wound with antibacterial soap and water 2. Anesthetic Topical Lidocaine 4% cream to wound bed prior to debridement 3. Peri-wound Care: Barrier cream 4. Dressing Applied: Prisma Ag 5. Secondary Dressing  Applied ABD Pad Dry Gauze 7. Secured with Tape 4 Layer Compression System - Bilateral [name], [name] ([id number]) Electronic Signature(s) Signed: 01/03/2016 5:31:40 PM By: [name] Entered By: [name] on 01/03/2016 13:59:05 Cotterman, Carrie Loletha [name] ([id number]) -------------------------------------------------------------------------------- Wound Assessment Details Patient Name: [name], Aisha C. Date of Service: 01/03/2016 1:30 PM Medical Record Patient Account Number: [id number] Number: Treating RN: [name] 1934-08-21 (80 y.o. Other Clinician: Date of Birth/Sex: Female) Treating [name], [name] Primary Care Physician/Extender: Claudette Laws Physician: Referring Physician: [name] in Treatment: 6 Wound Status Wound Number: 3 Primary Trauma, Other Etiology: Wound Location: Left Lower Leg - Anterior, Proximal Wound Status: Open Wounding Event: Trauma Comorbid Cataracts, Arrhythmia, Hypotension, History: Osteoarthritis Date Acquired: 12/18/2015 Weeks Of Treatment: 2 Clustered Wound: No Photos Photo Uploaded By: [name] on 01/03/2016 17:31:06 Wound Measurements Length: (cm) 3 % Reduction in Width: (cm) 0.8 % Reduction in Depth: (cm) 0.1 Epithelializati Area: (cm) 1.885 Tunneling: Volume: (cm) 0.188 Undermining: Area: 36.9% Volume: 68.5% on: None No No Wound Description Classification: Partial Thickness Wound Margin: Flat and Intact Exudate  Amount: Large Exudate Type: Serosanguineous Exudate Color: red, brown Foul Odor After Cleansing: No Wound Bed Granulation Amount: Medium (34-66%) Exposed Structure Haymore, Redina C. ([id number]) Granulation Quality: Red, Pink Fascia Exposed: No Necrotic Amount: Medium (34-66%) Fat Layer Exposed: No Tendon Exposed: No Muscle Exposed: No Joint Exposed: No Bone Exposed: No Limited to Skin Breakdown Periwound Skin Texture Texture Color No Abnormalities  Noted: No No Abnormalities Noted: No Localized Edema: Yes Temperature / Pain Moisture Temperature: No Abnormality No Abnormalities Noted: No Tenderness on Palpation: Yes Moist: Yes Wound Preparation Ulcer Cleansing: Rinsed/Irrigated with Saline Topical Anesthetic Applied: None, Other: lidocaine 4%, Treatment Notes Wound #3 (Left, Proximal, Anterior Lower Leg) 1. Cleansed with: Clean wound with Normal Saline Cleanse wound with antibacterial soap and water 2. Anesthetic Topical Lidocaine 4% cream to wound bed prior to debridement 3. Peri-wound Care: Barrier cream 4. Dressing Applied: Prisma Ag 5. Secondary Dressing Applied ABD Pad Dry Gauze 7. Secured with Tape 4 Layer Compression System - Bilateral Electronic Signature(s) Signed: 01/03/2016 5:31:40 PM By: [name] Entered By: [name] on 01/03/2016 14:00:42 Jost, Pendleton. ([id number]) -------------------------------------------------------------------------------- Vitals Details Patient Name: Carrie Mulligan C. Date of Service: 01/03/2016 1:30 PM Medical Record Patient Account Number: [id number] Number: Treating RN: [name] [birth year] (80 y.o. Other Clinician: Date of Birth/Sex: Female) Treating [name], [name] Primary Care Physician/Extender: Claudette Laws Physician: Referring Physician: [name] in Treatment: 6 Vital Signs Time Taken: 13:40 Pulse (bpm): 44 Height (in): 68 Respiratory Rate (breaths/min): 18 Weight (lbs): 178 Blood Pressure (mmHg): 104/39 Body Mass Index (BMI): 27.1 Reference Range: 80 - 120 mg / dl Electronic Signature(s) Signed: 01/03/2016 5:31:40 PM By: [name] Entered By: [name] on 01/03/2016 14:21:36

## 2016-01-04 NOTE — Progress Notes (Signed)
[name], [name] ([id number]) Visit Report for 12/13/2015 Chief Complaint Document Details [name], [name] Date of Service: 12/13/2015 3:00 PM Patient Name: C. Patient Account Number: [id number] Medical Record Treating RN: [name] [id number] Number: Other Clinician: [birth year] (81 y.o. Treating Taylor Spilde Date of Birth/Sex: Female) Physician/Extender: G Primary Care [name] Physician: Referring Physician: [name] in Treatment: 3 Information Obtained from: Patient Chief Complaint Patient is here for review of bilateral lower extremity wounds dating back to December 2016 Electronic Signature(s) Signed: 01/04/2016 7:33:08 AM By: [name] MD Entered By: [name] on 12/13/2015 16:06:54 [place], [name] ([id number]) -------------------------------------------------------------------------------- Debridement Details [name] Date of Service: 12/13/2015 3:00 PM Patient Name: C. Patient Account Number: [id number] Medical Record Treating RN: [name] [id number] Number: Other Clinician: 1934/11/14 (81 y.o. Treating Laquinton Bihm Date of Birth/Sex: Female) Physician/Extender: G Primary Care [name] Physician: Referring Physician: [name] in Treatment: 3 Debridement Performed for Wound #1 Left,Medial Lower Leg Assessment: Performed By: Physician [name], MD Debridement: Debridement Pre-procedure Yes Verification/Time Out Taken: Start Time: 15:57 Pain Control: Lidocaine 4% Topical Solution Level: Skin/Subcutaneous Tissue Total Area Debrided (L x 8 (cm) x 5 (cm) = 40 (cm) W): Tissue and other Viable, Non-Viable, Exudate, Fibrin/Slough, Subcutaneous material debrided: Instrument: Curette Bleeding: Minimum Hemostasis Achieved: Pressure End Time: 15:59 Procedural Pain: 0 Post Procedural Pain: 0 Response to Treatment: Procedure was tolerated well Post Debridement Measurements of Total  Wound Length: (cm) 8 Width: (cm) 5 Depth: (cm) 0.4 Volume: (cm) 12.566 Post Procedure Diagnosis Same as Pre-procedure Electronic Signature(s) Signed: 12/13/2015 5:18:41 PM By: [name] Signed: 01/04/2016 7:33:08 AM By: [name] MD Entered By: [name] on 12/13/2015 16:05:46 [name], [name] ([id number]) [name], [name] ([id number]) -------------------------------------------------------------------------------- Debridement Details [name] Date of Service: 12/13/2015 3:00 PM Patient Name: C. Patient Account Number: [id number] Medical Record Treating RN: [name] [id number] Number: Other Clinician: [birth year] (81 y.o. Treating Avrielle Fry Date of Birth/Sex: Female) Physician/Extender: G Primary Care [name] Physician: Referring Physician: [name] in Treatment: 3 Debridement Performed for Wound #2 Right,Lateral Lower Leg Assessment: Performed By: Physician [name], MD Debridement: Debridement Pre-procedure Yes Verification/Time Out Taken: Start Time: 15:59 Pain Control: Lidocaine 4% Topical Solution Level: Skin/Subcutaneous Tissue Total Area Debrided (L x 1.4 (cm) x 1.1 (cm) = 1.54 (cm) W): Tissue and other Viable, Non-Viable, Exudate, Fibrin/Slough, Subcutaneous material debrided: Instrument: Curette Bleeding: Minimum Hemostasis Achieved: Pressure End Time: 16:01 Procedural Pain: 0 Post Procedural Pain: 0 Response to Treatment: Procedure was tolerated well Post Debridement Measurements of Total Wound Length: (cm) 1.4 Width: (cm) 1.1 Depth: (cm) 0.3 Volume: (cm) 0.363 Post Procedure Diagnosis Same as Pre-procedure Electronic Signature(s) Signed: 12/13/2015 5:18:41 PM By: [name] Signed: 01/04/2016 7:33:08 AM By: [name] MD Entered By: [name] on 12/13/2015 16:06:13 [name], [name] ([id number]) [name], [name]  ([id number]) -------------------------------------------------------------------------------- HPI Details [name] Date of Service: 12/13/2015 3:00 PM Patient Name: C. Patient Account Number: [id number] Medical Record Treating RN: [name] [id number] Number: Other Clinician: May 19, 1935 (81 y.o. Treating Harjas Biggins, [place] Date of Birth/Sex: Female) Physician/Extender: G Torrington Physician: Referring Physician: [name] in Treatment: 3 History of Present Illness HPI Description: 11/22/15; this is [place]th is a 81 year old woman who lives at home on her own. According to the patient and her daughter was present she has had long-standing edema in her legs dating back many years. She also has a history of chronic systolic heart failure, atrial fibrillation and is status  post mitral valve replacement. Last [facility] I [facility] showed an ejection fraction of 40-45% she is on Lasix 60 mg a day and spironolactone 25 mg a day. Her current problem began in December around Christmas time she developed a small hematoma in the medial part of her left leg which rapidly expanded to a very large hematoma that required surgical debridement. This situation was complicated by the fact that the patient is on long-standing Coumadin for mechanical heart valve. She went to the OR had this evacuated on January 4 17. The wound has gradually improved however she has developed a small wounds around this area and more recently a wound on the right lateral leg. She is weeping edema fluid. The patient is already been to see vascular surgery. It was recommended that she wear Unna boots, she did not tolerate this due to pain in the left leg. She was then prescribed Juzo stockings and really can't get these on herself although truthfully there is probably too much edema for a Juzo stockings currently. She is not a diabetic and has no history of PAD or claudication  that I could elicit. She does not use the external compression pumps reliably. She comes today with notes from her primary physician and Dr. Ronalee Belts both recommending various forms of compression but the patient does not really complied with them. Has been using the external compression pumps with some regularity but certainly not daily on the right leg and this has helped. I also note that her daughter tells me the history that she does not sleep in bed at home. She has a hospital bed but with her legs up she finds this painful so she sleeps in the couch sitting up with her legs dependent. 11/29/15; the patient is arrives today accompanied by her son. He expresses satisfaction that she is maintained the compression all week. 12/06/15; the patient has kept her Profore light wraps on, we have good edema control no major change in the wounds we have been using Aquacel 12/13/15; changed to RTD last week. One of the 3 wounds on her left medial leg is healed she has 2 remaining wounds here and one on the right lateral leg. Electronic Signature(s) Signed: 01/04/2016 7:33:08 AM By: [name] MD Entered By: [name] on 12/13/2015 16:07:41 [name], [name] ([id number]) -------------------------------------------------------------------------------- Physical Exam Details [name] Date of Service: 12/13/2015 3:00 PM Patient Name: C. Patient Account Number: [id number] Medical Record Treating RN: [name] [id number] Number: Other Clinician: December 26, 1934 (81 y.o. Treating Bhavesh Vazquez Date of Birth/Sex: Female) Physician/Extender: G Primary Care [name] Physician: Referring Physician: [name] in Treatment: 3 Cardiovascular Pedal pulses palpable and strong bilaterally.. Edema present in both extremities.. Notes Wound exam; 2 open areas on the left legs small punched out areas with about 0.2-0.3 mm in depth. The area on the right lateral leg is still  open. All the remaining wounds require debridement no evidence of infection surrounding edema is marginally well controlled Electronic Signature(s) Signed: 01/04/2016 7:33:08 AM By: [name] MD Entered By: [name] on 12/13/2015 16:08:33 [name], [name] ([id number]) -------------------------------------------------------------------------------- Physician Orders Details [name] Date of Service: 12/13/2015 3:00 PM Patient Name: C. Patient Account Number: [id number] Medical Record Treating RN: [name] [id number] Number: Other Clinician: 1934-11-19 (81 y.o. Treating Numan Zylstra Date of Birth/Sex: Female) Physician/Extender: G Primary Care [name] Physician: Referring Physician: [name] in Treatment: 3 Verbal / Phone Orders: Yes Clinician: Carolyne Fiscal, Debi Read Back and Verified:  Yes Diagnosis Coding Wound Cleansing Wound #1 Left,Medial Lower Leg o May shower with protection. o No tub bath. Wound #2 Right,Lateral Lower Leg o May shower with protection. o No tub bath. Anesthetic Wound #1 Left,Medial Lower Leg o Topical Lidocaine 4% cream applied to wound bed prior to debridement Wound #2 Right,Lateral Lower Leg o Topical Lidocaine 4% cream applied to wound bed prior to debridement Skin Barriers/Peri-Wound Care Wound #1 Left,Medial Lower Leg o Barrier cream Wound #2 Right,Lateral Lower Leg o Barrier cream Primary Wound Dressing Wound #1 Left,Medial Lower Leg o Other: - RTD Wound #2 Right,Lateral Lower Leg o Other: - RTD Secondary Dressing Wound #1 Left,Medial Lower Leg Hiemstra, Meris C. ([id number]) o ABD pad o Dry Gauze Wound #2 Right,Lateral Lower Leg o ABD pad o Dry Gauze Dressing Change Frequency Wound #1 Left,Medial Lower Leg o Change dressing every week Wound #2 Right,Lateral Lower Leg o Change dressing every week Follow-up Appointments Wound #1 Left,Medial Lower Leg o  Return Appointment in 1 week. Wound #2 Right,Lateral Lower Leg o Return Appointment in 1 week. Edema Control Wound #1 Left,Medial Lower Leg o 3 Layer Compression System - Bilateral - unna to anchor Wound #2 Right,Lateral Lower Leg o 3 Layer Compression System - Bilateral - unna to anchor Additional Orders / Instructions Wound #1 Left,Medial Lower Leg o Increase protein intake. o Activity as tolerated Wound #2 Right,Lateral Lower Leg o Increase protein intake. o Activity as tolerated Electronic Signature(s) Signed: 12/13/2015 5:18:41 PM By: [name] Signed: 01/04/2016 7:33:08 AM By: [name] MD Entered By: [name] on 12/13/2015 16:00:02 [name] ([id number]) -------------------------------------------------------------------------------- Problem List Details [name] Date of Service: 12/13/2015 3:00 PM Patient Name: C. Patient Account Number: [id number] Medical Record Treating RN: [name] [id number] Number: Other Clinician: April 15, 1935 (81 y.o. Treating Dashawn Golda Date of Birth/Sex: Female) Physician/Extender: G Primary Care [name] Physician: Referring Physician: [name] in Treatment: 3 Active Problems ICD-10 Encounter Code Description Active Date Diagnosis I87.333 Chronic venous hypertension (idiopathic) with ulcer and 11/22/2015 Yes inflammation of bilateral lower extremity I89.0 Lymphedema, not elsewhere classified 11/22/2015 Yes [id number] Chronic systolic (congestive) heart failure 11/22/2015 Yes Inactive Problems Resolved Problems Electronic Signature(s) Signed: 01/04/2016 7:33:08 AM By: [name] MD Entered By: [name] on 12/13/2015 16:05:07 [name], [name] ([id number]) -------------------------------------------------------------------------------- Progress Note Details [name] Date of Service: 12/13/2015 3:00 PM Patient Name: C. Patient Account Number:  [id number] Medical Record Treating RN: [name] [id number] Number: Other Clinician: [birth year] (81 y.o. Treating Markayla Reichart, [place] Date of Birth/Sex: Female) Physician/Extender: G Primary Care [name] Physician: Referring Physician: [name] in Treatment: 3 Subjective Chief Complaint Information obtained from Patient Patient is here for review of bilateral lower extremity wounds dating back to December 2016 History of Present Illness (HPI) 11/22/15; this is [name] is a 81 year old woman who lives at home on her own. According to the patient and her daughter was present she has had long-standing edema in her legs dating back many years. She also has a history of chronic systolic heart failure, atrial fibrillation and is status post mitral valve replacement. Last echocardiogram I see in [facility] showed an ejection fraction of 40-45% she is on Lasix 60 mg a day and spironolactone 25 mg a day. Her current problem began in December around Christmas time she developed a small hematoma in the medial part of her left leg which rapidly expanded to a very large hematoma that required surgical debridement. This situation was complicated by the fact that  the patient is on long-standing Coumadin for mechanical heart valve. She went to the OR had this evacuated on January 4 17. The wound has gradually improved however she has developed a small wounds around this area and more recently a wound on the right lateral leg. She is weeping edema fluid. The patient is already been to see vascular surgery. It was recommended that she wear Unna boots, she did not tolerate this due to pain in the left leg. She was then prescribed Juzo stockings and really can't get these on herself although truthfully there is probably too much edema for a Juzo stockings currently. She is not a diabetic and has no history of PAD or claudication that I could elicit. She does not use the external  compression pumps reliably. She comes today with notes from her primary physician and Dr. Ronalee Belts both recommending various forms of compression but the patient does not really complied with them. Has been using the external compression pumps with some regularity but certainly not daily on the right leg and this has helped. I also note that her daughter tells me the history that she does not sleep in bed at home. She has a hospital bed but with her legs up she finds this painful so she sleeps in the couch sitting up with her legs dependent. 11/29/15; the patient is arrives today accompanied by her son. He expresses satisfaction that she is maintained the compression all week. 12/06/15; the patient has kept her Profore light wraps on, we have good edema control no major change in the wounds we have been using Aquacel 12/13/15; changed to RTD last week. One of the 3 wounds on her left medial leg is healed she has 2 remaining wounds here and one on the right lateral leg. [name], [name] ([id number]) Objective Constitutional Vitals Time Taken: 3:32 PM, Height: 68 in, Weight: 178 lbs, BMI: 27.1, Temperature: 98.0 F, Pulse: 50 bpm, Respiratory Rate: 18 breaths/min, Blood Pressure: 122/39 mmHg. Cardiovascular Pedal pulses palpable and strong bilaterally.. Edema present in both extremities.. General Notes: Wound exam; 2 open areas on the left legs small punched out areas with about 0.2-0.3 mm in depth. The area on the right lateral leg is still open. All the remaining wounds require debridement no evidence of infection surrounding edema is marginally well controlled Integumentary (Hair, Skin) Wound #1 status is Open. Original cause of wound was Gradually Appeared. The wound is located on the Left,Medial Lower Leg. The wound measures 8cm length x 5cm width x 0.4cm depth; 31.416cm^2 area and 12.566cm^3 volume. The wound is limited to skin breakdown. There is no tunneling or undermining noted. There  is a large amount of serosanguineous drainage noted. The wound margin is distinct with the outline attached to the wound base. There is medium (34-66%) red granulation within the wound bed. There is a medium (34-66%) amount of necrotic tissue within the wound bed including Adherent Slough. The periwound skin appearance exhibited: Moist. The periwound skin appearance did not exhibit: Callus, Crepitus, Excoriation, Fluctuance, Friable, Induration, Localized Edema, Rash, Scarring, Dry/Scaly, Maceration, Atrophie Blanche, Cyanosis, Ecchymosis, Hemosiderin Staining, Mottled, Pallor, Rubor, Erythema. Periwound temperature was noted as No Abnormality. The periwound has tenderness on palpation. Wound #2 status is Open. Original cause of wound was Gradually Appeared. The wound is located on the Right,Lateral Lower Leg. The wound measures 1.4cm length x 1.1cm width x 0.3cm depth; 1.21cm^2 area and 0.363cm^3 volume. The wound is limited to skin breakdown. There is no tunneling or undermining noted.  There is a large amount of serosanguineous drainage noted. The wound margin is distinct with the outline attached to the wound base. There is small (1-33%) pink, pale granulation within the wound bed. There is a large (67-100%) amount of necrotic tissue within the wound bed including Adherent Slough. The periwound skin appearance exhibited: Localized Edema, Moist. The periwound skin appearance did not exhibit: Callus, Crepitus, Excoriation, Fluctuance, Friable, Induration, Rash, Scarring, Dry/Scaly, Maceration, Atrophie Blanche, Cyanosis, Ecchymosis, Hemosiderin Staining, Mottled, Pallor, Rubor, Erythema. Periwound temperature was noted as No Abnormality. The periwound has tenderness on palpation. Assessment Active Problems NAJWA, WOLBECK ([id number]) ICD-10 I87.333 - Chronic venous hypertension (idiopathic) with ulcer and inflammation of bilateral lower extremity I89.0 - Lymphedema, not elsewhere  classified [id number] - Chronic systolic (congestive) heart failure Procedures Wound #1 Wound #1 is a Lymphedema located on the Left,Medial Lower Leg . There was a Skin/Subcutaneous Tissue Debridement [id number]) debridement with total area of 40 sq cm performed by [name], MD. with the following instrument(s): Curette to remove Viable and Non-Viable tissue/material including Exudate, Fibrin/Slough, and Subcutaneous after achieving pain control using Lidocaine 4% Topical Solution. A time out was conducted prior to the start of the procedure. A Minimum amount of bleeding was controlled with Pressure. The procedure was tolerated well with a pain level of 0 throughout and a pain level of 0 following the procedure. Post Debridement Measurements: 8cm length x 5cm width x 0.4cm depth; 12.566cm^3 volume. Post procedure Diagnosis Wound #1: Same as Pre-Procedure Wound #2 Wound #2 is a Lymphedema located on the Right,Lateral Lower Leg . There was a Skin/Subcutaneous Tissue Debridement [id number]) debridement with total area of 1.54 sq cm performed by [name], MD. with the following instrument(s): Curette to remove Viable and Non-Viable tissue/material including Exudate, Fibrin/Slough, and Subcutaneous after achieving pain control using Lidocaine 4% Topical Solution. A time out was conducted prior to the start of the procedure. A Minimum amount of bleeding was controlled with Pressure. The procedure was tolerated well with a pain level of 0 throughout and a pain level of 0 following the procedure. Post Debridement Measurements: 1.4cm length x 1.1cm width x 0.3cm depth; 0.363cm^3 volume. Post procedure Diagnosis Wound #2: Same as Pre-Procedure Plan Wound Cleansing: Wound #1 Left,Medial Lower Leg: May shower with protection. No tub bath. Wound #2 Right,Lateral Lower Leg: May shower with protection. No tub bath. Anesthetic: AUBRYE, MADEJA ([id number]) Wound #1 Left,Medial  Lower Leg: Topical Lidocaine 4% cream applied to wound bed prior to debridement Wound #2 Right,Lateral Lower Leg: Topical Lidocaine 4% cream applied to wound bed prior to debridement Skin Barriers/Peri-Wound Care: Wound #1 Left,Medial Lower Leg: Barrier cream Wound #2 Right,Lateral Lower Leg: Barrier cream Primary Wound Dressing: Wound #1 Left,Medial Lower Leg: Other: - RTD Wound #2 Right,Lateral Lower Leg: Other: - RTD Secondary Dressing: Wound #1 Left,Medial Lower Leg: ABD pad Dry Gauze Wound #2 Right,Lateral Lower Leg: ABD pad Dry Gauze Dressing Change Frequency: Wound #1 Left,Medial Lower Leg: Change dressing every week Wound #2 Right,Lateral Lower Leg: Change dressing every week Follow-up Appointments: Wound #1 Left,Medial Lower Leg: Return Appointment in 1 week. Wound #2 Right,Lateral Lower Leg: Return Appointment in 1 week. Edema Control: Wound #1 Left,Medial Lower Leg: 3 Layer Compression System - Bilateral - unna to anchor Wound #2 Right,Lateral Lower Leg: 3 Layer Compression System - Bilateral - unna to anchor Additional Orders / Instructions: Wound #1 Left,Medial Lower Leg: Increase protein intake. Activity as tolerated Wound #2 Right,Lateral Lower Leg: Increase protein intake. Activity  as tolerated She will continue RTD under profore lite wrap [place], Hinda C. ([id number]) I considered increasing from profore lite to profore,dont see ABI's Electronic Signature(s) Signed: 01/04/2016 7:33:08 AM By: [name] MD Entered By: [name] on 12/13/2015 16:10:32 Mandich, [name] ([id number]) -------------------------------------------------------------------------------- SuperBill Details Patient Name: [name] C. Date of Service: 12/13/2015 Medical Record Patient Account Number: [id number] Number: Treating RN: [name] 1935/05/07 (80 y.o. Other Clinician: Date of Birth/Sex: Female) Treating Brunella Wileman,  Merrit Friesen Primary Care Physician/Extender: Claudette Laws Physician: Suella Grove in Treatment: 3 Referring Physician: [name] Diagnosis Coding ICD-10 Codes Code Description Chronic venous hypertension (idiopathic) with ulcer and inflammation of bilateral lower I87.333 extremity I89.0 Lymphedema, not elsewhere classified [id number] Chronic systolic (congestive) heart failure Facility Procedures CPT4: Description Modifier Quantity Code [id number] - DEB SUBQ TISSUE 20 SQ CM/< 1 ICD-10 Description Diagnosis I87.333 Chronic venous hypertension (idiopathic) with ulcer and inflammation of bilateral lower extremity CPT4: [id number] 11045 - DEB SUBQ TISS EA ADDL 20CM 2 ICD-10 Description Diagnosis I87.333 Chronic venous hypertension (idiopathic) with ulcer and inflammation of bilateral lower extremity Physician Procedures CPT4: Description Modifier Quantity Code [id number] - WC PHYS SUBQ TISS 20 SQ CM 1 ICD-10 Description Diagnosis I87.333 Chronic venous hypertension (idiopathic) with ulcer and inflammation of bilateral lower extremity CPT4: [id number] 11045 - WC PHYS SUBQ TISS EA ADDL 20 CM 2 Description CHERRI, FRANCKE ([id number]) Electronic Signature(s) Signed: 01/04/2016 7:33:08 AM By: [name] MD Entered By: [name] on 12/13/2015 16:11:17

## 2016-01-08 ENCOUNTER — Ambulatory Visit: Payer: Medicare Other

## 2016-01-08 DIAGNOSIS — I5042 Chronic combined systolic (congestive) and diastolic (congestive) heart failure: Secondary | ICD-10-CM

## 2016-01-08 DIAGNOSIS — Z952 Presence of prosthetic heart valve: Secondary | ICD-10-CM

## 2016-01-08 DIAGNOSIS — Z9889 Other specified postprocedural states: Secondary | ICD-10-CM

## 2016-01-08 NOTE — Progress Notes (Signed)
Daily Session Note  Patient Details  Name: [name] MRN: [id number] Date of Birth: 05-25-1935 Referring Provider:    Encounter Date: 01/08/2016  Check In:     Session Check In - 01/08/16 1623    Check-In   Location [facility]   Staff Present Earlean Shawl, BS, ACSM CEP, Exercise Physiologist;Amanda Oletta Darter, BA, ACSM CEP, Exercise Physiologist;Diane Joya Gaskins, RN, BSN   Supervising physician immediately available to respond to emergencies See telemetry face sheet for immediately available ER MD   Medication changes reported     No   Fall or balance concerns reported    No   Warm-up and Cool-down Performed on first and last piece of equipment   Resistance Training Performed Yes   VAD Patient? No   Pain Assessment   Currently in Pain? No/denies   Multiple Pain Sites No         Goals Met:  Independence with exercise equipment Exercise tolerated well No report of cardiac concerns or symptoms Strength training completed today  Goals Unmet:  Not Applicable  Comments: Pt able to follow exercise prescription today without complaint.  Will continue to monitor for progression.   Dr. [name] is Medical Director for [facility] and [facility].

## 2016-01-10 ENCOUNTER — Encounter: Payer: Medicare Other | Admitting: Nurse Practitioner

## 2016-01-10 ENCOUNTER — Ambulatory Visit: Payer: Medicare Other

## 2016-01-10 DIAGNOSIS — I5042 Chronic combined systolic (congestive) and diastolic (congestive) heart failure: Secondary | ICD-10-CM | POA: Diagnosis not present

## 2016-01-11 ENCOUNTER — Ambulatory Visit: Payer: Medicare Other

## 2016-01-11 ENCOUNTER — Encounter: Payer: Self-pay | Admitting: *Deleted

## 2016-01-11 ENCOUNTER — Telehealth: Payer: Self-pay | Admitting: *Deleted

## 2016-01-11 NOTE — Telephone Encounter (Signed)
[name] left a vm that she is sorry that she has not been able to attend Cardiac Rehab due to several infections.

## 2016-01-12 NOTE — Progress Notes (Signed)
Carrie, [name] ([id number]) Visit Report for 01/10/2016 Chief Complaint Document Details Patient Name: Carrie, [name] 01/10/2016 2:30 Date of Service: PM Medical Record [id number] Number: Patient Account Number: [id number] July 27, 1934 (81 y.o. Treating RN: [name] Date of Birth/Sex: Female) Other Clinician: Primary Care Physician: [name] Treating [name] Referring Physician: Emily [name] Physician/Extender: [name] in Treatment: 7 Information Obtained from: Patient Chief Complaint Patient is here for review of bilateral lower extremity wounds dating back to December 2016 Electronic Signature(s) Signed: 01/11/2016 5:22:48 PM By: [name] Entered By: [name] on 01/10/2016 15:42:13 [name], Carrie Bring ([id number]) -------------------------------------------------------------------------------- HPI Details Patient Name: [name] 01/10/2016 2:30 Date of Service: PM Medical Record [id number] Number: Patient Account Number: [id number] 1935/06/15 (81 y.o. Treating RN: [name] Date of Birth/Sex: Female) Other Clinician: Primary Care Physician: [name] Treating [name] Referring Physician: Emily [name] Physician/Extender: [name] in Treatment: 7 History of Present Illness HPI Description: 11/22/15; this is [name] is a 81 year old woman who lives at home on her own. According to the patient and her daughter was present she has had long-standing edema in her legs dating back many years. She also has a history of chronic systolic heart failure, atrial fibrillation and is status post mitral valve replacement. Last echocardiogram I see in [facility] showed an ejection fraction of 40-45% she is on Lasix 60 mg a day and spironolactone 25 mg a day. Her current problem began in December around Christmas time she developed a small hematoma in the medial part of her left leg which rapidly expanded to a very  large hematoma that required surgical debridement. This situation was complicated by the fact that the patient is on long-standing Coumadin for mechanical heart valve. She went to the OR had this evacuated on January 4 17. The wound has gradually improved however she has developed a small wounds around this area and more recently a wound on the right lateral leg. She is weeping edema fluid. The patient is already been to see vascular surgery. It was recommended that she wear Unna boots, she did not tolerate this due to pain in the left leg. She was then prescribed Juzo stockings and really can't get these on herself although truthfully there is probably too much edema for a Juzo stockings currently. She is not a diabetic and has no history of PAD or claudication that I could elicit. She does not use the external compression pumps reliably. She comes today with notes from her primary physician and [name] both recommending various forms of compression but the patient does not really complied with them. Has been using the external compression pumps with some regularity but certainly not daily on the right leg and this has helped. I also note that her daughter tells me the history that she does not sleep in bed at home. She has a hospital bed but with her legs up she finds this painful so she sleeps in the couch sitting up with her legs dependent. 11/29/15; the patient is arrives today accompanied by her son. He expresses satisfaction that she is maintained the compression all week. 12/06/15; the patient has kept her Profore light wraps on, we have good edema control no major change in the wounds we have been using Aquacel 12/13/15; changed to RTD last week. One of the 3 wounds on her left medial leg is healed she has 2 remaining wounds here and one on the right lateral leg. 12/18/2015 -- the patient was at [name] office  today and he was seeing her for an arterial study. While the wrap was  being removed she had an inadvertent laceration of the left proximal anterior leg which bled quite profusely and a compression dressing was applied over this and the patient was here to get her Profore wraps done. I was asked to see the patient to make an assessment and treat appropriately. 12/26/15; the patient's injury on the left proximal leg and Steri-Strips removed after soaking. There is an open area here. The original wounds to still open on the right lateral and left medial leg. 01/03/16 patient's injury on her proximal left leg looks quite good. Still small open area on the medial left leg which appears to be improving. The area on the right lateral leg still substantially open with no real improvement in wound depth. Her edema control is marginal with a Profore light. We have been using RTD for 3-4 weeks without any major change 01/10/16: wounds without s/s of infection. vascular results are pending regarding arterial studies. Carrie, [name] ([id number]) Electronic Signature(s) Signed: 01/11/2016 5:22:48 PM By: [name] Entered By: [name] on 01/10/2016 15:43:01 [name], Carrie Bring ([id number]) -------------------------------------------------------------------------------- Physical Exam Details Patient Name: Carrie, [name] 01/10/2016 2:30 Date of Service: PM Medical Record [id number] Number: Patient Account Number: [id number] January 01, 1935 (81 y.o. Treating RN: [name] Date of Birth/Sex: Female) Other Clinician: Primary Care Physician: [name] Treating [name] Referring Physician: Emily [name] Physician/Extender: [name] in Treatment: 7 Constitutional Patient's appearance is neat and clean. Appears in no acute distress. Well nourished and well developed.. Eyes Conjunctivae clear. No discharge.. Ears, Nose, Mouth, and Throat Patient can hear normal speaking tones without difficulty.Marland Kitchen Respiratory Respiratory effort is easy and  symmetric bilaterally. Rate is normal at rest and on room air.. Cardiovascular Pedal pulses palpable and strong bilaterally.Marland Kitchen Psychiatric Judgement and insight intact.. Alert and oriented times 3.. Short and long term memory intact.. No evidence of depression, anxiety, or agitation. Calm, cooperative, and communicative. Appropriate interactions and affect.. Electronic Signature(s) Signed: 01/11/2016 5:22:48 PM By: [name] Entered By: [name] on 01/10/2016 15:44:04 [name], Carrie Bring ([id number]) -------------------------------------------------------------------------------- Physician Orders Details Patient Name: Carrie, [name] 01/10/2016 2:30 Date of Service: PM Medical Record [id number] Number: Patient Account Number: [id number] [birth year] (81 y.o. Treating RN: [name] Date of Birth/Sex: Female) Other Clinician: Primary Care Physician: [name] Treating [name] Referring Physician: Emily [name] Physician/Extender: [name] in Treatment: 7 Verbal / Phone Orders: Yes Clinician: Carolyne Fiscal, Debi Read Back and Verified: Yes Diagnosis Coding Wound Cleansing Wound #1 Left,Medial Lower Leg o May shower with protection. o No tub bath. Wound #2 Right,Lateral Lower Leg o May shower with protection. o No tub bath. Wound #3 Left,Proximal,Anterior Lower Leg o May shower with protection. o May shower with protection. o No tub bath. o No tub bath. Anesthetic Wound #1 Left,Medial Lower Leg o Topical Lidocaine 4% cream applied to wound bed prior to debridement Wound #2 Right,Lateral Lower Leg o Topical Lidocaine 4% cream applied to wound bed prior to debridement Wound #3 Left,Proximal,Anterior Lower Leg o Topical Lidocaine 4% cream applied to wound bed prior to debridement o Topical Lidocaine 4% cream applied to wound bed prior to debridement Skin Barriers/Peri-Wound Care Wound #1 Left,Medial Lower Leg o  Barrier cream Wound #2 Right,Lateral Lower Leg o Barrier cream Wound #3 Left,Proximal,Anterior Lower Leg o Barrier cream Coone, Millenia C. ([id number]) Primary Wound Dressing Wound #1 Left,Medial Lower Leg o Prisma Ag Wound #2 Right,Lateral Lower Leg o Prisma  Ag Wound #3 Left,Proximal,Anterior Lower Leg o Prisma Ag Secondary Dressing Wound #1 Left,Medial Lower Leg o ABD pad o Dry Gauze Wound #2 Right,Lateral Lower Leg o ABD pad o Dry Gauze Wound #3 Left,Proximal,Anterior Lower Leg o Dry Gauze Dressing Change Frequency Wound #1 Left,Medial Lower Leg o Change dressing every week Wound #2 Right,Lateral Lower Leg o Change dressing every week Follow-up Appointments Wound #1 Left,Medial Lower Leg o Return Appointment in 1 week. Wound #2 Right,Lateral Lower Leg o Return Appointment in 1 week. Edema Control Wound #1 Left,Medial Lower Leg o 4 Layer Compression System - Bilateral - unna to anchor Wound #2 Right,Lateral Lower Leg o 4 Layer Compression System - Bilateral - unna to anchor Wound #3 Left,Proximal,Anterior Lower Leg o 4 Layer Compression System - Bilateral [name], [name] ([id number]) Additional Orders / Instructions Wound #1 Left,Medial Lower Leg o Increase protein intake. o Activity as tolerated Wound #2 Right,Lateral Lower Leg o Increase protein intake. o Activity as tolerated Wound #3 Left,Proximal,Anterior Lower Leg o Increase protein intake. o Activity as tolerated Electronic Signature(s) Signed: 01/10/2016 5:18:57 PM By: [name] Signed: 01/11/2016 5:22:48 PM By: [name] Entered By: [name] on 01/10/2016 15:36:08 [name], Carrie Bring ([id number]) -------------------------------------------------------------------------------- Problem List Details Patient Name: BRITNEI, KOSMICKI 01/10/2016 2:30 Date of Service: PM Medical Record [id number] Number: Patient Account  Number: [id number] [birth year] (81 y.o. Treating RN: [name] Date of Birth/Sex: Female) Other Clinician: Primary Care Physician: [name] Treating [name] Referring Physician: Emily [name] Physician/Extender: [name] in Treatment: 7 Active Problems ICD-10 Encounter Code Description Active Date Diagnosis I87.333 Chronic venous hypertension (idiopathic) with ulcer and 11/22/2015 Yes inflammation of bilateral lower extremity I89.0 Lymphedema, not elsewhere classified 11/22/2015 Yes [id number] Chronic systolic (congestive) heart failure 11/22/2015 Yes S81.812A Laceration without foreign body, left lower leg, initial 12/18/2015 Yes encounter Inactive Problems Resolved Problems Electronic Signature(s) Signed: 01/11/2016 5:22:48 PM By: [name] Entered By: [name] on 01/10/2016 15:42:04 [name], Carrie Bring ([id number]) -------------------------------------------------------------------------------- Progress Note Details Patient Name: [name] 01/10/2016 2:30 Date of Service: PM Medical Record [id number] Number: Patient Account Number: [id number] 1935-05-05 (81 y.o. Treating RN: [name] Date of Birth/Sex: Female) Other Clinician: Primary Care Physician: [name] Treating [name] Referring Physician: Emily [name] Physician/Extender: [name] in Treatment: 7 Subjective Chief Complaint Information obtained from Patient Patient is here for review of bilateral lower extremity wounds dating back to December 2016 History of Present Illness (HPI) 11/22/15; this is Culliver is a 81 year old woman who lives at home on her own. According to the patient and her daughter was present she has had long-standing edema in her legs dating back many years. She also has a history of chronic systolic heart failure, atrial fibrillation and is status post mitral valve replacement. Last echocardiogram I see in [facility] showed an ejection  fraction of 40-45% she is on Lasix 60 mg a day and spironolactone 25 mg a day. Her current problem began in December around Christmas time she developed a small hematoma in the medial part of her left leg which rapidly expanded to a very large hematoma that required surgical debridement. This situation was complicated by the fact that the patient is on long-standing Coumadin for mechanical heart valve. She went to the OR had this evacuated on January 4 17. The wound has gradually improved however she has developed a small wounds around this area and more recently a wound on the right lateral leg. She is weeping edema fluid. The patient is already  been to see vascular surgery. It was recommended that she wear Unna boots, she did not tolerate this due to pain in the left leg. She was then prescribed Juzo stockings and really can't get these on herself although truthfully there is probably too much edema for a Juzo stockings currently. She is not a diabetic and has no history of PAD or claudication that I could elicit. She does not use the external compression pumps reliably. She comes today with notes from her primary physician and [name] both recommending various forms of compression but the patient does not really complied with them. Has been using the external compression pumps with some regularity but certainly not daily on the right leg and this has helped. I also note that her daughter tells me the history that she does not sleep in bed at home. She has a hospital bed but with her legs up she finds this painful so she sleeps in the couch sitting up with her legs dependent. 11/29/15; the patient is arrives today accompanied by her son. He expresses satisfaction that she is maintained the compression all week. 12/06/15; the patient has kept her Profore light wraps on, we have good edema control no major change in the wounds we have been using Aquacel 12/13/15; changed to RTD last week. One of  the 3 wounds on her left medial leg is healed she has 2 remaining wounds here and one on the right lateral leg. 12/18/2015 -- the patient was at [name] office today and he was seeing her for an arterial study. While the wrap was being removed she had an inadvertent laceration of the left proximal anterior leg which bled quite profusely and a compression dressing was applied over this and the patient was here to get her Profore wraps done. I was asked to see the patient to make an assessment and treat appropriately. 12/26/15; the patient's injury on the left proximal leg and Steri-Strips removed after soaking. There is an open area here. The original wounds to still open on the right lateral and left medial leg. Carrie, [name] ([id number]) 01/03/16 patient's injury on her proximal left leg looks quite good. Still small open area on the medial left leg which appears to be improving. The area on the right lateral leg still substantially open with no real improvement in wound depth. Her edema control is marginal with a Profore light. We have been using RTD for 3-4 weeks without any major change 01/10/16: wounds without s/s of infection. vascular results are pending regarding arterial studies. denies fever, chills. body aches or malaise. Objective Constitutional Patient's appearance is neat and clean. Appears in no acute distress. Well nourished and well developed.. Vitals Time Taken: 3:13 PM, Height: 68 in, Weight: 178 lbs, BMI: 27.1, Temperature: 97.9 F, Pulse: 46 bpm, Respiratory Rate: 18 breaths/min, Blood Pressure: 103/39 mmHg. Eyes Conjunctivae clear. No discharge.. Ears, Nose, Mouth, and Throat Patient can hear normal speaking tones without difficulty.Marland Kitchen Respiratory Respiratory effort is easy and symmetric bilaterally. Rate is normal at rest and on room air.. Cardiovascular Pedal pulses palpable and strong bilaterally.Marland Kitchen Psychiatric Judgement and insight intact.. Alert and  oriented times 3.. Short and long term memory intact.. No evidence of depression, anxiety, or agitation. Calm, cooperative, and communicative. Appropriate interactions and affect.. Integumentary (Hair, Skin) Wound #1 status is Open. Original cause of wound was Gradually Appeared. The wound is located on the Left,Medial Lower Leg. The wound measures 0.7cm length x 0.4cm width x 0.2cm depth; 0.22cm^2 area  and 0.044cm^3 volume. The wound is limited to skin breakdown. There is no tunneling or undermining noted. There is a large amount of serosanguineous drainage noted. The wound margin is distinct with the outline attached to the wound base. There is medium (34-66%) red granulation within the wound bed. There is a medium (34-66%) amount of necrotic tissue within the wound bed including Adherent Slough. The periwound skin appearance exhibited: Moist. The periwound skin appearance did not exhibit: Callus, Pons, [name] ([id number]) Crepitus, Excoriation, Fluctuance, Friable, Induration, Localized Edema, Rash, Scarring, Dry/Scaly, Maceration, Atrophie Blanche, Cyanosis, Ecchymosis, Hemosiderin Staining, Mottled, Pallor, Rubor, Erythema. Periwound temperature was noted as No Abnormality. The periwound has tenderness on palpation. Wound #2 status is Open. Original cause of wound was Gradually Appeared. The wound is located on the Right,Lateral Lower Leg. The wound measures 1.5cm length x 0.7cm width x 0.4cm depth; 0.825cm^2 area and 0.33cm^3 volume. The wound is limited to skin breakdown. There is no tunneling or undermining noted. There is a large amount of serosanguineous drainage noted. The wound margin is distinct with the outline attached to the wound base. There is medium (34-66%) pink, pale granulation within the wound bed. There is a medium (34-66%) amount of necrotic tissue within the wound bed including Adherent Slough. The periwound skin appearance exhibited: Localized Edema, Moist. The  periwound skin appearance did not exhibit: Callus, Crepitus, Excoriation, Fluctuance, Friable, Induration, Rash, Scarring, Dry/Scaly, Maceration, Atrophie Blanche, Cyanosis, Ecchymosis, Hemosiderin Staining, Mottled, Pallor, Rubor, Erythema. Periwound temperature was noted as No Abnormality. The periwound has tenderness on palpation. Wound #3 status is Open. Original cause of wound was Trauma. The wound is located on the Left,Proximal,Anterior Lower Leg. The wound measures 2cm length x 0.5cm width x 0.2cm depth; 0.785cm^2 area and 0.157cm^3 volume. The wound is limited to skin breakdown. There is no tunneling or undermining noted. There is a large amount of serosanguineous drainage noted. The wound margin is flat and intact. There is medium (34-66%) red, pink granulation within the wound bed. There is a medium (34- 66%) amount of necrotic tissue within the wound bed. The periwound skin appearance exhibited: Localized Edema, Moist. Periwound temperature was noted as No Abnormality. The periwound has tenderness on palpation. Assessment Active Problems ICD-10 I87.333 - Chronic venous hypertension (idiopathic) with ulcer and inflammation of bilateral lower extremity I89.0 - Lymphedema, not elsewhere classified [id number] - Chronic systolic (congestive) heart failure [id number] - Laceration without foreign body, left lower leg, initial encounter Plan Wound Cleansing: Wound #1 Left,Medial Lower Leg: May shower with protection. No tub bath. MAEVE, MCKIVER ([id number]) Wound #2 Right,Lateral Lower Leg: May shower with protection. No tub bath. Wound #3 Left,Proximal,Anterior Lower Leg: May shower with protection. May shower with protection. No tub bath. No tub bath. Anesthetic: Wound #1 Left,Medial Lower Leg: Topical Lidocaine 4% cream applied to wound bed prior to debridement Wound #2 Right,Lateral Lower Leg: Topical Lidocaine 4% cream applied to wound bed prior to debridement Wound #3  Left,Proximal,Anterior Lower Leg: Topical Lidocaine 4% cream applied to wound bed prior to debridement Topical Lidocaine 4% cream applied to wound bed prior to debridement Skin Barriers/Peri-Wound Care: Wound #1 Left,Medial Lower Leg: Barrier cream Wound #2 Right,Lateral Lower Leg: Barrier cream Wound #3 Left,Proximal,Anterior Lower Leg: Barrier cream Primary Wound Dressing: Wound #1 Left,Medial Lower Leg: Prisma Ag Wound #2 Right,Lateral Lower Leg: Prisma Ag Wound #3 Left,Proximal,Anterior Lower Leg: Prisma Ag Secondary Dressing: Wound #1 Left,Medial Lower Leg: ABD pad Dry Gauze Wound #2 Right,Lateral Lower Leg: ABD pad Dry Gauze Wound #  3 Left,Proximal,Anterior Lower Leg: Dry Gauze Dressing Change Frequency: Wound #1 Left,Medial Lower Leg: Change dressing every week Wound #2 Right,Lateral Lower Leg: Change dressing every week Follow-up Appointments: Wound #1 Left,Medial Lower Leg: Return Appointment in 1 week. Wound #2 Right,Lateral Lower Leg: Return Appointment in 1 week. Edema Control: Wound #1 Left,Medial Lower Leg: KARALYN, SANTOSO. ([id number]) 4 Layer Compression System - Bilateral - unna to anchor Wound #2 Right,Lateral Lower Leg: 4 Layer Compression System - Bilateral - unna to anchor Wound #3 Left,Proximal,Anterior Lower Leg: 4 Layer Compression System - Bilateral Additional Orders / Instructions: Wound #1 Left,Medial Lower Leg: Increase protein intake. Activity as tolerated Wound #2 Right,Lateral Lower Leg: Increase protein intake. Activity as tolerated Wound #3 Left,Proximal,Anterior Lower Leg: Increase protein intake. Activity as tolerated Follow-Up Appointments: A follow-up appointment should be scheduled. Medication Reconciliation completed and provided to Patient/Care Provider. A Patient Clinical Summary of Care was provided to [place] Signature(s) Signed: 01/11/2016 5:22:48 PM By: [name] Entered By: [name] on  01/10/2016 15:44:33 Giannotti, Carrie Bring ([id number]) -------------------------------------------------------------------------------- SuperBill Details Patient Name: [name] Date of Service: 01/10/2016 Medical Record Number: [id number] Patient Account Number: [id number] Date of Birth/Sex: Mar 30, 1935 (81 y.o. Female) Treating RN: Carolyne Fiscal, Debi Primary Care Physician: [name] Other Clinician: Referring Physician: Emily [name] Treating Physician/Extender: Loistine Chance in Treatment: 7 Diagnosis Coding ICD-10 Codes Code Description Chronic venous hypertension (idiopathic) with ulcer and inflammation of bilateral lower I87.333 extremity I89.0 Lymphedema, not elsewhere classified [id number] Chronic systolic (congestive) heart failure [id number] Laceration without foreign body, left lower leg, initial encounter Facility Procedures CPT4: Description Modifier Quantity Code [id number] BILATERAL: Application of multi-layer venous compression 1 system; leg (below knee), including ankle and foot. Physician Procedures CPT4: Description Modifier Quantity Code [id number] - WC PHYS LEVEL 3 - EST PT 1 ICD-10 Description Diagnosis I87.333 Chronic venous hypertension (idiopathic) with ulcer and inflammation of bilateral lower extremity I89.0 Lymphedema, not elsewhere  classified S81.812A Laceration without foreign body, left lower leg, initial encounter [id number] Chronic systolic (congestive) heart failure Electronic Signature(s) Signed: 01/10/2016 5:18:57 PM By: [name] Signed: 01/11/2016 5:22:48 PM By: [name] Entered By: [name] on 01/10/2016 16:43:20

## 2016-01-12 NOTE — Progress Notes (Signed)
[name], [name] ([id number]) Visit Report for 01/10/2016 Arrival Information Details Patient Name: [name], [name]. Date of Service: 01/10/2016 2:30 PM Medical Record Number: [id number] Patient Account Number: [id number] Date of Birth/Sex: [birth year] (80 y.o. Female) Treating RN: Carolyne Fiscal, Debi Primary Care Physician: [name] Other Clinician: Referring Physician: [name] Treating Physician/Extender: Loistine Chance in Treatment: 7 Visit Information History Since Last Visit All ordered tests and consults were completed: No Patient Arrived: [name] Added or deleted any medications: No Arrival Time: 14:47 Any new allergies or adverse reactions: No Accompanied By: self Had a fall or experienced change in No Transfer Assistance: EasyPivot Patient activities of daily living that may affect Lift risk of falls: Patient Identification Verified: Yes Signs or symptoms of abuse/neglect since last No Secondary Verification Yes visito Process Completed: Hospitalized since last visit: No Patient Requires No Pain Present Now: No Transmission-Based Precautions: Patient Has Alerts: Yes Patient Alerts: Patient on Blood Thinner Lamar Signature(s) Signed: 01/10/2016 5:18:57 PM By: [name] Entered By: [name] on 01/10/2016 14:47:39 [name], [name] ([id number]) -------------------------------------------------------------------------------- Encounter Discharge Information Details Patient Name: Carrie Mulligan C. Date of Service: 01/10/2016 2:30 PM Medical Record Number: [id number] Patient Account Number: [id number] Date of Birth/Sex: 10-19-1934 (80 y.o. Female) Treating RN: [name] Primary Care Physician: [name] Other Clinician: Referring Physician: [name] Treating Physician/Extender: Loistine Chance in Treatment: 7 Encounter Discharge Information Items Discharge Pain Level:  0 Discharge Condition: Stable Ambulatory Status: Cane Discharge Destination: Home Transportation: Private Auto Accompanied By: self Schedule Follow-up Appointment: Yes Medication Reconciliation completed Yes and provided to Patient/Care Ghali Morissette: Provided on Clinical Summary of Care: 01/10/2016 Form Type Recipient Paper Patient MS Electronic Signature(s) Signed: 01/10/2016 3:38:46 PM By: Ruthine Dose Entered By: Ruthine Dose on 01/10/2016 15:38:46 [name], [name] ([id number]) -------------------------------------------------------------------------------- Lower Extremity Assessment Details Patient Name: Carrie Mulligan C. Date of Service: 01/10/2016 2:30 PM Medical Record Number: [id number] Patient Account Number: [id number] Date of Birth/Sex: [birth year] (80 y.o. Female) Treating RN: [name] Primary Care Physician: [name] Other Clinician: Referring Physician: [name] Treating Physician/Extender: Loistine Chance in Treatment: 7 Edema Assessment Assessed: [Left: No] [Right: No] E[Left: dema] [Right: :] Calf Left: Right: Point of Measurement: 36 cm From Medial Instep 38.4 cm 38 cm Ankle Left: Right: Point of Measurement: 10 cm From Medial Instep 23.6 cm 25.4 cm Vascular Assessment Pulses: Posterior Tibial Dorsalis Pedis Palpable: [Left:Yes] [Right:Yes] Extremity colors, hair growth, and conditions: Extremity Color: [Left:Hyperpigmented] [Right:Hyperpigmented] Temperature of Extremity: [Left:Warm] [Right:Warm] Capillary Refill: [Left:< 3 seconds] [Right:< 3 seconds] Electronic Signature(s) Signed: 01/10/2016 5:18:57 PM By: [name] Entered By: [name] on 01/10/2016 15:00:58 [name], [name] ([id number]) -------------------------------------------------------------------------------- Multi Wound Chart Details Patient Name: Carrie Mulligan C. Date of Service: 01/10/2016 2:30 PM Medical Record Number:  [id number] Patient Account Number: [id number] Date of Birth/Sex: 1934-12-21 (80 y.o. Female) Treating RN: Carolyne Fiscal, Debi Primary Care Physician: [name] Other Clinician: Referring Physician: [name] Treating Physician/Extender: Loistine Chance in Treatment: 7 Photos: [1:No Photos] [2:No Photos] [3:No Photos] Wound Location: [1:Left Lower Leg - Medial] [2:Right Lower Leg - Lateral Left Lower Leg - Anterior,] [3:Proximal] Wounding Event: [1:Gradually Appeared] [2:Gradually Appeared] [3:Trauma] Primary Etiology: [1:Lymphedema] [2:Lymphedema] [3:Trauma, Other] Comorbid History: [1:Cataracts, Arrhythmia, Hypotension, Osteoarthritis] [2:Cataracts, Arrhythmia, Hypotension, Osteoarthritis] [3:Cataracts, Arrhythmia, Hypotension, Osteoarthritis] Date Acquired: [1:07/04/2015] [2:07/04/2015] [3:12/18/2015] Weeks of Treatment: [1:7] [2:7] [3:3] Wound Status: [1:Open] [2:Open] [3:Open] Measurements L x W x D 0.7x0.4x0.2 [2:1.5x0.7x0.4] [3:2x0.5x0.2] (cm) Area (cm) : [1:0.22] [2:0.825] [3:0.785] Volume (cm) : [1:0.044] [2:0.33] [3:0.157] %  Reduction in Area: [1:99.50%] [2:41.70%] [3:73.70%] % Reduction in Volume: 99.50% [2:-134.00%] [3:73.70%] Classification: [1:Partial Thickness] [2:Partial Thickness] [3:Partial Thickness] Exudate Amount: [1:Large] [2:Large] [3:Large] Exudate Type: [1:Serosanguineous] [2:Serosanguineous] [3:Serosanguineous] Exudate Color: [1:red, brown] [2:red, brown] [3:red, brown] Wound Margin: [1:Distinct, outline attached] [2:Distinct, outline attached] [3:Flat and Intact] Granulation Amount: [1:Medium (34-66%)] [2:Medium (34-66%)] [3:Medium (34-66%)] Granulation Quality: [1:Red] [2:Pink, Pale] [3:Red, Pink] Necrotic Amount: [1:Medium (34-66%)] [2:Medium (34-66%)] [3:Medium (34-66%)] Exposed Structures: [1:Fascia: No Fat: No Tendon: No Muscle: No Joint: No Bone: No Limited to Skin Breakdown] [2:Fascia: No Fat: No Tendon: No Muscle: No Joint: No Bone: No Limited  to Skin Breakdown] [3:Fascia: No Fat: No Tendon: No Muscle: No Joint: No Bone: No Limited to  Skin Breakdown] Epithelialization: [1:None] [2:None] [3:None] Periwound Skin Texture: Edema: No [1:Excoriation: No Induration: No Callus: No] [2:Edema: Yes Excoriation: No Induration: No Callus: No] [3:Edema: Yes] Crepitus: No Crepitus: No Fluctuance: No Fluctuance: No Friable: No Friable: No Rash: No Rash: No Scarring: No Scarring: No Periwound Skin Moist: Yes Moist: Yes Moist: Yes Moisture: Maceration: No Maceration: No Dry/Scaly: No Dry/Scaly: No Periwound Skin Color: Atrophie Blanche: No Atrophie Blanche: No No Abnormalities Noted Cyanosis: No Cyanosis: No Ecchymosis: No Ecchymosis: No Erythema: No Erythema: No Hemosiderin Staining: No Hemosiderin Staining: No Mottled: No Mottled: No Pallor: No Pallor: No Rubor: No Rubor: No Temperature: No Abnormality No Abnormality No Abnormality Tenderness on Yes Yes Yes Palpation: Wound Preparation: Ulcer Cleansing: Ulcer Cleansing: Ulcer Cleansing: Rinsed/Irrigated with Rinsed/Irrigated with Rinsed/Irrigated with Saline Saline Saline Topical Anesthetic Topical Anesthetic Topical Anesthetic Applied: Other: lidocaine Applied: Other: lidocaine Applied: None, Other: 4% 4% lidocaine 4% Treatment Notes Electronic Signature(s) Signed: 01/10/2016 5:18:57 PM By: [name] Entered By: [name] on 01/10/2016 15:08:09 [place], [name] ([id number]) -------------------------------------------------------------------------------- [place] Details Patient Name: JAYLIAH, EMMITT C. Date of Service: 01/10/2016 2:30 PM Medical Record Number: [id number] Patient Account Number: [id number] Date of Birth/Sex: 1935-03-24 (80 y.o. Female) Treating RN: Carolyne Fiscal, Debi Primary Care Physician: [name] Other Clinician: Referring Physician: [name] Treating Physician/Extender: Loistine Chance  in Treatment: 7 Active Inactive Orientation to the Wound Care Program Nursing Diagnoses: Knowledge deficit related to the wound healing center program Goals: Patient/caregiver will verbalize understanding of the [facility] Program Date Initiated: 11/22/2015 Goal Status: Active Interventions: Provide education on orientation to the wound center Notes: Venous Leg Ulcer Nursing Diagnoses: Knowledge deficit related to disease process and management Potential for venous Insuffiency (use before diagnosis confirmed) Goals: Patient will maintain optimal edema control Date Initiated: 11/22/2015 Goal Status: Active Patient/caregiver will verbalize understanding of disease process and disease management Date Initiated: 11/22/2015 Goal Status: Active Verify adequate tissue perfusion prior to therapeutic compression application Date Initiated: 11/22/2015 Goal Status: Active Interventions: Assess peripheral edema status every visit. Compression as ordered Provide education on venous insufficiency [name], [name] ([id number]) Treatment Activities: Non-invasive vascular studies : 11/22/2015 Therapeutic compression applied : 11/22/2015 Notes: Wound/Skin Impairment Nursing Diagnoses: Impaired tissue integrity Knowledge deficit related to ulceration/compromised skin integrity Goals: Patient/caregiver will verbalize understanding of skin care regimen Date Initiated: 11/22/2015 Goal Status: Active Ulcer/skin breakdown will have a volume reduction of 30% by week 4 Date Initiated: 11/22/2015 Goal Status: Active Ulcer/skin breakdown will have a volume reduction of 50% by week 8 Date Initiated: 11/22/2015 Goal Status: Active Ulcer/skin breakdown will have a volume reduction of 80% by week 12 Date Initiated: 11/22/2015 Goal Status: Active Ulcer/skin breakdown will heal within 14 weeks Date Initiated: 11/22/2015 Goal Status: Active Interventions: Assess patient/caregiver ability to  perform  ulcer/skin care regimen upon admission and as needed Assess ulceration(s) every visit Provide education on ulcer and skin care Treatment Activities: Skin care regimen initiated : 11/22/2015 Topical wound management initiated : 11/22/2015 Notes: Electronic Signature(s) Signed: 01/10/2016 5:18:57 PM By: [name] Entered By: [name] on 01/10/2016 15:07:58 [name], [name] ([id number]) [name], [place]. ([id number]) -------------------------------------------------------------------------------- Pain Assessment Details Patient Name: Carrie Mulligan C. Date of Service: 01/10/2016 2:30 PM Medical Record Number: [id number] Patient Account Number: [id number] Date of Birth/Sex: [birth year] (80 y.o. Female) Treating RN: Carolyne Fiscal, Debi Primary Care Physician: [name] Other Clinician: Referring Physician: [name] Treating Physician/Extender: Loistine Chance in Treatment: 7 Active Problems Location of Pain Severity and Description of Pain Patient Has Paino No Site Locations With Dressing Change: No Pain Management and Medication Current Pain Management: Notes Topical or injectable lidocaine is offered to patient for acute pain when surgical debridement is performed. If needed, Patient is instructed to use over the counter pain medication for the following 24-48 hours after debridement. Wound care MDs do not prescribed pain medications. Electronic Signature(s) Signed: 01/10/2016 5:18:57 PM By: [name] Entered By: [name] on 01/10/2016 14:47:51 [name], [name] ([id number]) -------------------------------------------------------------------------------- Patient/Caregiver Education Details Patient Name: [name] Date of Service: 01/10/2016 2:30 PM Medical Record Number: [id number] Patient Account Number: [id number] Date of Birth/Gender: Aug 10, 1934 (81 y.o. Female) Treating RN: [name] Primary Care  Physician: [name] Other Clinician: Referring Physician: [name] Treating Physician/Extender: Loistine Chance in Treatment: 7 Education Assessment Education Provided To: Patient Education Topics Provided Wound/Skin Impairment: Handouts: Other: do not get wraps wet Methods: Demonstration, Explain/Verbal Responses: State content correctly Electronic Signature(s) Signed: 01/10/2016 5:18:57 PM By: [name] Entered By: [name] on 01/10/2016 15:09:07 [name], [name] ([id number]) -------------------------------------------------------------------------------- Wound Assessment Details Patient Name: Carrie Mulligan C. Date of Service: 01/10/2016 2:30 PM Medical Record Number: [id number] Patient Account Number: [id number] Date of Birth/Sex: 03/29/1935 (80 y.o. Female) Treating RN: Carolyne Fiscal, Debi Primary Care Physician: [name] Other Clinician: Referring Physician: [name] Treating Physician/Extender: Loistine Chance in Treatment: 7 Wound Status Wound Number: 1 Primary Lymphedema Etiology: Wound Location: Left Lower Leg - Medial Wound Status: Open Wounding Event: Gradually Appeared Comorbid Cataracts, Arrhythmia, Hypotension, Date Acquired: 07/04/2015 History: Osteoarthritis Weeks Of Treatment: 7 Clustered Wound: No Photos Photo Uploaded By: [name] on 01/10/2016 16:51:03 Wound Measurements Length: (cm) 0.7 Width: (cm) 0.4 Depth: (cm) 0.2 Area: (cm) 0.22 Volume: (cm) 0.044 % Reduction in Area: 99.5% % Reduction in Volume: 99.5% Epithelialization: None Tunneling: No Undermining: No Wound Description Classification: Partial Thickness Wound Margin: Distinct, outline attached Exudate Amount: Large Exudate Type: Serosanguineous Exudate Color: red, brown Foul Odor After Cleansing: No Wound Bed Granulation Amount: Medium (34-66%) Exposed Structure Granulation Quality: Red Fascia Exposed: No Necrotic  Amount: Medium (34-66%) Fat Layer Exposed: No Necrotic Quality: Adherent Slough Tendon Exposed: No [name], [name] ([id number]) Muscle Exposed: No Joint Exposed: No Bone Exposed: No Limited to Skin Breakdown Periwound Skin Texture Texture Color No Abnormalities Noted: No No Abnormalities Noted: No Callus: No Atrophie Blanche: No Crepitus: No Cyanosis: No Excoriation: No Ecchymosis: No Fluctuance: No Erythema: No Friable: No Hemosiderin Staining: No Induration: No Mottled: No Localized Edema: No Pallor: No Rash: No Rubor: No Scarring: No Temperature / Pain Moisture Temperature: No Abnormality No Abnormalities Noted: No Tenderness on Palpation: Yes Dry / Scaly: No Maceration: No Moist: Yes Wound Preparation Ulcer Cleansing: Rinsed/Irrigated with Saline Topical Anesthetic Applied: Other: lidocaine 4%, Treatment Notes Wound #1 (Left, Medial Lower Leg)  1. Cleansed with: Cleanse wound with antibacterial soap and water 2. Anesthetic Topical Lidocaine 4% cream to wound bed prior to debridement 4. Dressing Applied: Prisma Ag 5. Secondary Dressing Applied ABD Pad Dry Gauze 7. Secured with Tape 4 Layer Compression System - Bilateral Electronic Signature(s) Signed: 01/10/2016 5:18:57 PM By: [name] Entered By: [name] on 01/10/2016 15:01:53 Brandes, NARA GOTWALT ([id number]) Lever[place], [place]. ([id number]) -------------------------------------------------------------------------------- Wound Assessment Details Patient Name: [name], Mariesha C. Date of Service: 01/10/2016 2:30 PM Medical Record Number: [id number] Patient Account Number: [id number] Date of Birth/Sex: 1935-05-18 (80 y.o. Female) Treating RN: Carolyne Fiscal, Debi Primary Care Physician: [name] Other Clinician: Referring Physician: [name] Treating Physician/Extender: Loistine Chance in Treatment: 7 Wound Status Wound Number: 2 Primary Lymphedema Etiology: Wound  Location: Right Lower Leg - Lateral Wound Status: Open Wounding Event: Gradually Appeared Comorbid Cataracts, Arrhythmia, Hypotension, Date Acquired: 07/04/2015 History: Osteoarthritis Weeks Of Treatment: 7 Clustered Wound: No Photos Photo Uploaded By: [name] on 01/10/2016 16:51:04 Wound Measurements Length: (cm) 1.5 Width: (cm) 0.7 Depth: (cm) 0.4 Area: (cm) 0.825 Volume: (cm) 0.33 % Reduction in Area: 41.7% % Reduction in Volume: -134% Epithelialization: None Tunneling: No Undermining: No Wound Description Classification: Partial Thickness Wound Margin: Distinct, outline attached Exudate Amount: Large Exudate Type: Serosanguineous Exudate Color: red, brown Foul Odor After Cleansing: No Wound Bed Granulation Amount: Medium (34-66%) Exposed Structure Granulation Quality: Pink, Pale Fascia Exposed: No Necrotic Amount: Medium (34-66%) Fat Layer Exposed: No Necrotic Quality: Adherent Slough Tendon Exposed: No Easler, Keiyana C. ([id number]) Muscle Exposed: No Joint Exposed: No Bone Exposed: No Limited to Skin Breakdown Periwound Skin Texture Texture Color No Abnormalities Noted: No No Abnormalities Noted: No Callus: No Atrophie Blanche: No Crepitus: No Cyanosis: No Excoriation: No Ecchymosis: No Fluctuance: No Erythema: No Friable: No Hemosiderin Staining: No Induration: No Mottled: No Localized Edema: Yes Pallor: No Rash: No Rubor: No Scarring: No Temperature / Pain Moisture Temperature: No Abnormality No Abnormalities Noted: No Tenderness on Palpation: Yes Dry / Scaly: No Maceration: No Moist: Yes Wound Preparation Ulcer Cleansing: Rinsed/Irrigated with Saline Topical Anesthetic Applied: Other: lidocaine 4%, Treatment Notes Wound #2 (Right, Lateral Lower Leg) 1. Cleansed with: Cleanse wound with antibacterial soap and water 2. Anesthetic Topical Lidocaine 4% cream to wound bed prior to debridement 4. Dressing Applied: Prisma  Ag 5. Secondary Dressing Applied ABD Pad Dry Gauze 7. Secured with Tape 4 Layer Compression System - Bilateral Electronic Signature(s) Signed: 01/10/2016 5:18:57 PM By: [name] Entered By: [name] on 01/10/2016 15:02:42 Afzal, ZARIAN FACEY ([id number]) [place], Pavo Loletha Grayer ([id number]) -------------------------------------------------------------------------------- Wound Assessment Details Patient Name: [name], Terrye C. Date of Service: 01/10/2016 2:30 PM Medical Record Number: [id number] Patient Account Number: [id number] Date of Birth/Sex: 1935-01-25 (80 y.o. Female) Treating RN: Carolyne Fiscal, Debi Primary Care Physician: [name] Other Clinician: Referring Physician: [name] Treating Physician/Extender: Loistine Chance in Treatment: 7 Wound Status Wound Number: 3 Primary Trauma, Other Etiology: Wound Location: Left Lower Leg - Anterior, Proximal Wound Status: Open Wounding Event: Trauma Comorbid Cataracts, Arrhythmia, Hypotension, History: Osteoarthritis Date Acquired: 12/18/2015 Weeks Of Treatment: 3 Clustered Wound: No Photos Photo Uploaded By: [name] on 01/10/2016 16:51:24 Wound Measurements Length: (cm) 2 % Reduction in Width: (cm) 0.5 % Reduction in Depth: (cm) 0.2 Epithelializati Area: (cm) 0.785 Tunneling: Volume: (cm) 0.157 Undermining: Area: 73.7% Volume: 73.7% on: None No No Wound Description Classification: Partial Thickness Wound Margin: Flat and Intact Exudate Amount: Large Exudate Type: Serosanguineous Exudate Color: red, brown Foul Odor After Cleansing: No Wound  Bed Granulation Amount: Medium (34-66%) Exposed Structure Granulation Quality: Red, Pink Fascia Exposed: No Necrotic Amount: Medium (34-66%) Fat Layer Exposed: No Cerezo, Ashleynicole C. ([id number]) Tendon Exposed: No Muscle Exposed: No Joint Exposed: No Bone Exposed: No Limited to Skin Breakdown Periwound Skin Texture Texture  Color No Abnormalities Noted: No No Abnormalities Noted: No Localized Edema: Yes Temperature / Pain Moisture Temperature: No Abnormality No Abnormalities Noted: No Tenderness on Palpation: Yes Moist: Yes Wound Preparation Ulcer Cleansing: Rinsed/Irrigated with Saline Topical Anesthetic Applied: None, Other: lidocaine 4%, Treatment Notes Wound #3 (Left, Proximal, Anterior Lower Leg) 1. Cleansed with: Cleanse wound with antibacterial soap and water 2. Anesthetic Topical Lidocaine 4% cream to wound bed prior to debridement 4. Dressing Applied: Prisma Ag 5. Secondary Dressing Applied ABD Pad Dry Gauze 7. Secured with Tape 4 Layer Compression System - Bilateral Electronic Signature(s) Signed: 01/10/2016 5:18:57 PM By: [name] Entered By: [name] on 01/10/2016 15:04:01 [place], [name] ([id number]) -------------------------------------------------------------------------------- Vitals Details Patient Name: Carrie Mulligan C. Date of Service: 01/10/2016 2:30 PM Medical Record Number: [id number] Patient Account Number: [id number] Date of Birth/Sex: 10-Sep-1934 (80 y.o. Female) Treating RN: Carolyne Fiscal, Debi Primary Care Physician: [name] Other Clinician: Referring Physician: [name] Treating Physician/Extender: Loistine Chance in Treatment: 7 Vital Signs Time Taken: 15:13 Temperature (F): 97.9 Height (in): 68 Pulse (bpm): 46 Weight (lbs): 178 Respiratory Rate (breaths/min): 18 Body Mass Index (BMI): 27.1 Blood Pressure (mmHg): 103/39 Reference Range: 80 - 120 mg / dl Electronic Signature(s) Signed: 01/10/2016 5:18:57 PM By: [name] Entered By: [name] on 01/10/2016 15:14:39

## 2016-01-15 ENCOUNTER — Encounter: Payer: Medicare Other | Admitting: *Deleted

## 2016-01-15 ENCOUNTER — Ambulatory Visit: Payer: Medicare Other

## 2016-01-15 DIAGNOSIS — Z9889 Other specified postprocedural states: Secondary | ICD-10-CM

## 2016-01-15 DIAGNOSIS — I5042 Chronic combined systolic (congestive) and diastolic (congestive) heart failure: Secondary | ICD-10-CM

## 2016-01-15 DIAGNOSIS — Z952 Presence of prosthetic heart valve: Secondary | ICD-10-CM

## 2016-01-15 NOTE — Progress Notes (Signed)
Daily Session Note  Patient Details  Name: [name] MRN: [id number] Date of Birth: February 15, 1935 Referring Provider:    Encounter Date: 01/15/2016  Check In:     Session Check In - 01/15/16 1633    Check-In   Location [facility]   Staff Present Nyoka Cowden, RN, BSN, Kela Millin, BA, ACSM CEP, Exercise Physiologist;Diane Joya Gaskins, RN, BSN   Supervising physician immediately available to respond to emergencies See telemetry face sheet for immediately available ER MD   Medication changes reported     Yes   Comments Has been on antibiotic since last Monday for eye/nose infection    Fall or balance concerns reported    No   Warm-up and Cool-down Performed on first and last piece of equipment   Resistance Training Performed Yes   VAD Patient? No   Pain Assessment   Currently in Pain? No/denies   Multiple Pain Sites No         Goals Met:  Independence with exercise equipment Exercise tolerated well No report of cardiac concerns or symptoms Strength training completed today  Goals Unmet:  Not Applicable  Comments: Pt able to follow exercise prescription today without complaint.  Will continue to monitor for progression.    Dr. [name] is Medical Director for [facility] and [facility].

## 2016-01-17 ENCOUNTER — Encounter: Payer: Medicare Other | Admitting: Internal Medicine

## 2016-01-17 ENCOUNTER — Encounter: Payer: Self-pay | Admitting: *Deleted

## 2016-01-17 ENCOUNTER — Ambulatory Visit: Payer: Medicare Other

## 2016-01-17 DIAGNOSIS — I5042 Chronic combined systolic (congestive) and diastolic (congestive) heart failure: Secondary | ICD-10-CM

## 2016-01-17 DIAGNOSIS — Z952 Presence of prosthetic heart valve: Secondary | ICD-10-CM

## 2016-01-17 DIAGNOSIS — Z9889 Other specified postprocedural states: Secondary | ICD-10-CM

## 2016-01-17 NOTE — Progress Notes (Signed)
Cardiac Individual Treatment Plan  Patient Details  Name: [name] MRN: [id number] Date of Birth: 1935-02-03 Referring Provider:    Initial Encounter Date:       Cardiac Rehab from 09/25/2015 in [facility] Cardiac and Pulmonary Rehab   Date  09/25/15      Visit Diagnosis: [name] (mitral valve replacement)  [name] (mitral valve repair)  Chronic combined systolic and diastolic congestive heart failure ([place])  Patient'[name] Home Medications on Admission:  Current outpatient prescriptions:  .  acetaminophen (TYLENOL) 500 MG tablet, Take 500 mg by mouth every 6 (six) hours as needed for mild pain, moderate pain, fever or headache., Disp: , Rfl:  .  amiodarone (PACERONE) 200 MG tablet, Take 200 mg by mouth daily., Disp: , Rfl:  .  aspirin EC 81 MG tablet, Take 81 mg by mouth daily. , Disp: , Rfl:  .  furosemide (LASIX) 40 MG tablet, Take 60 mg by mouth daily., Disp: , Rfl:  .  gabapentin (NEURONTIN) 100 MG capsule, Take 1 capsule (100 mg total) by mouth at bedtime., Disp: 90 capsule, Rfl: 0 .  metoprolol succinate (TOPROL-XL) 25 MG 24 hr tablet, Take 1 tablet (25 mg total) by mouth daily., Disp: 30 tablet, Rfl: 2 .  Multiple Vitamin (MULTI-VITAMINS) TABS, Take 1 tablet by mouth daily. , Disp: , Rfl:  .  pantoprazole (PROTONIX) 40 MG tablet, Take 40 mg by mouth daily. , Disp: , Rfl:  .  potassium chloride (K-DUR) 10 MEQ tablet, Take by mouth., Disp: , Rfl:  .  spironolactone (ALDACTONE) 25 MG tablet, Take 25 mg by mouth daily., Disp: , Rfl:  .  traMADol (ULTRAM) 50 MG tablet, TAKE 1/2 TO 1 TABLET BY MOUTH TWICE A DAY AS NEEDED FOR PAIN, Disp: , Rfl: 5 .  vitamin C (ASCORBIC ACID) 500 MG tablet, Take 500 mg by mouth daily as needed (for cold symptoms). , Disp: , Rfl:  .  warfarin (COUMADIN) 5 MG tablet, Take 1 tablet (5 mg total) by mouth daily., Disp: 30 tablet, Rfl: 2  Past Medical History: Past Medical History  Diagnosis Date  . Shortness of breath dyspnea   . Hypertension   .  Chronic combined systolic (congestive) and diastolic (congestive) heart failure (HCC)     a. EF 25% by echo in 08/2015 b. RHC in 08/2015 showed normal filling pressures  . GERD (gastroesophageal reflux disease)   . Arthritis   . Chronic atrial fibrillation ([place])     a. On Coumadin  . [name] (mitral valve replacement)     a. [name] 1994 b. redo [name] in 08/2014 - on Coumadin    Tobacco Use: History  Smoking status  . Former Smoker  Smokeless tobacco  . Never Used    Labs: Recent Review Flowsheet Data    There is no flowsheet data to display.       Exercise Target Goals:    Exercise Program Goal: Individual exercise prescription set with THRR, safety & activity barriers. Participant demonstrates ability to understand and report RPE using BORG scale, to self-measure pulse accurately, and to acknowledge the importance of the exercise prescription.  Exercise Prescription Goal: Starting with aerobic activity 30 plus minutes a day, 3 days per week for initial exercise prescription. Provide home exercise prescription and guidelines that participant acknowledges understanding prior to discharge.  Activity Barriers & Risk Stratification:     Activity Barriers & Cardiac Risk Stratification - 09/25/15 1531    Activity Barriers & Cardiac Risk Stratification  Activity Barriers Arthritis;Joint Problems   Cardiac Risk Stratification High      6 Minute Walk:     6 Minute Walk      09/25/15 1509       6 Minute Walk   Phase Initial  NuStep Test      Distance 413 feet  Nu Step Test Level 1 with arms     Walk Time 6 minutes     # of Rest Breaks 0     RPE 13     Symptoms No     Resting HR 111 bpm     Resting BP 132/60 mmHg     Max Ex. HR 116 bpm     Max Ex. BP 138/54 mmHg        Initial Exercise Prescription:     Initial Exercise Prescription - 09/25/15 1500    Date of Initial Exercise RX and Referring Provider   Date 09/25/15   Treadmill   MPH 1  Use caution with  treadmill and small time intervals   Grade 0   Minutes 3   Recumbant Bike   Level 2   RPM 40   Watts 15   Minutes 10   NuStep   Level 2   Watts 20   Minutes 15   Arm Ergometer   Level 1   Watts 10   Minutes 15   Arm/Foot Ergometer   Level 4   Watts 12   Minutes 15   Recumbant Elliptical   Level 1   RPM 35   Watts 10   Minutes 10   REL-XR   Level 2   Watts 30   Minutes 15   T5 Nustep   Level 2   Watts 15   Minutes 15   Biostep-RELP   Level 2   Watts 15   Minutes 15   Prescription Details   Frequency (times per week) 3   Duration Progress to 30 minutes of continuous aerobic without signs/symptoms of physical distress   Intensity   THRR REST +  30   Ratings of Perceived Exertion 11-15   Progression   Progression Continue progressive overload as per policy without signs/symptoms or physical distress.   Resistance Training   Training Prescription Yes   Weight 2   Reps 10-15      Perform Capillary Blood Glucose checks as needed.  Exercise Prescription Changes:     Exercise Prescription Changes      12/29/15 1000 01/11/16 1200         Exercise Review   Progression  Yes      Response to Exercise   Blood Pressure (Admit) 114/56 mmHg 142/80 mmHg      Blood Pressure (Exercise) 102/50 mmHg 112/52 mmHg      Blood Pressure (Exit) 122/64 mmHg 116/60 mmHg      Heart Rate (Admit) 58 bpm 58 bpm      Heart Rate (Exercise) 60 bpm 78 bpm      Heart Rate (Exit) 66 bpm 54 bpm      Rating of Perceived Exertion (Exercise) 13 13      Duration Progress to 30 minutes of continuous aerobic without signs/symptoms of physical distress Progress to 45 minutes of aerobic exercise without signs/symptoms of physical distress      Intensity THRR unchanged       Progression   Progression Continue to progress workloads to maintain intensity without signs/symptoms of physical distress. Continue to progress workloads to maintain intensity  without signs/symptoms of physical distress.       Resistance Training   Training Prescription Yes Yes      Weight 2 1      Reps 10-15 10-15      Treadmill   MPH 1       Grade 0       Minutes 5       NuStep   Level 4 4      Watts  24      Minutes 25 35         Exercise Comments:     Exercise Comments      10/20/15 1158 11/16/15 1540 12/29/15 1020 01/11/16 1245     Exercise Comments [name] has not attended Heart Track since 09-27-15. Goal will be to resume program when able. [name] has not attended since 3-29. [name] has returned after a 3 month absence.  Her exercise prescription was reviewed as well as equipment safety. [name] is progresing well with exercise duration and has added TM for short periods.       Discharge Exercise Prescription (Final Exercise Prescription Changes):     Exercise Prescription Changes - 01/11/16 1200    Exercise Review   Progression Yes   Response to Exercise   Blood Pressure (Admit) 142/80 mmHg   Blood Pressure (Exercise) 112/52 mmHg   Blood Pressure (Exit) 116/60 mmHg   Heart Rate (Admit) 58 bpm   Heart Rate (Exercise) 78 bpm   Heart Rate (Exit) 54 bpm   Rating of Perceived Exertion (Exercise) 13   Duration Progress to 45 minutes of aerobic exercise without signs/symptoms of physical distress   Progression   Progression Continue to progress workloads to maintain intensity without signs/symptoms of physical distress.   Resistance Training   Training Prescription Yes   Weight 1   Reps 10-15   NuStep   Level 4   Watts 24   Minutes 35      Nutrition:  Target Goals: Understanding of nutrition guidelines, daily intake of sodium <15104m, cholesterol <2068m calories 30% from fat and 7% or less from saturated fats, daily to have 5 or more servings of fruits and vegetables.  Biometrics:     Pre Biometrics - 09/25/15 1508    Pre Biometrics   Height 5' 5.5" (1.664 m)   Weight 186 lb 1.6 oz (84.414 kg)   Waist Circumference 34 inches   Hip Circumference 42 inches    Waist to Hip Ratio 0.81 %   BMI (Calculated) 30.6       Nutrition Therapy Plan and Nutrition Goals:     Nutrition Therapy & Goals - 10/04/15 1707    Personal Nutrition Goals   Comments Carrie. Greenblatt prefers not to meet with RD       Nutrition Discharge: Rate Your Plate Scores:     Nutrition Assessments - 09/28/15 1421    Rate Your Plate Scores   Pre Score 65   Pre Score % 72 %      Nutrition Goals Re-Evaluation:   Psychosocial: Target Goals: Acknowledge presence or absence of depression, maximize coping skills, provide positive support system. Participant is able to verbalize types and ability to use techniques and skills needed for reducing stress and depression.  Initial Review & Psychosocial Screening:     Initial Psych Review & Screening - 09/25/15 [place]      Quality of Life Scores:     Quality  of Life - 09/25/15 1541    Quality of Life Scores   Health/Function Pre 19.5 %   Socioeconomic Pre 26.75 %   Psych/Spiritual Pre 20.64 %   Family Pre 24.88 %   GLOBAL Pre 21.59 %      PHQ-9:     Recent Review Flowsheet Data    Depression screen [facility] 2/9 09/25/2015 01/05/2015   Decreased Interest 0 0   Down, Depressed, Hopeless 0 0   PHQ - 2 Score 0 0   Altered sleeping 3 1   Tired, decreased energy 3 1   Change in appetite 0 0   Feeling bad or failure about yourself  0 0   Trouble concentrating 0 0   Moving slowly or fidgety/restless 0 0   Suicidal thoughts 0 0   PHQ-9 Score 6 2   Difficult doing work/chores Somewhat difficult -      Psychosocial Evaluation and Intervention:     Psychosocial Evaluation - 12/27/15 1714    Psychosocial Evaluation & Interventions   Comments Counselor met with [name] for a new psychosocial evaluation.  [name] was in this same program a little over a year ago and is now back due to some recent health issues.  She continues to have a strong support system with a daughter  living in the same home and a son that is close by and very involved.  [name] reports that she has had to contend a great deal with swelling in her body and was in the hospital for this in March.  She has subsequently had surgery on her leg to remove a growth there and is going to the wound center often for treatment of that.  [name] reports she is sleeping better lately and has a good appetite.  She denies a history of depression or anxiety, but admits she does have some stress in her life with her adult children arguing quite often over her care.  Her goals for this program are to get the swelling down so the wounds will heal better.  Counselor will follow with Carrie. [name] as needed.        Psychosocial Re-Evaluation:     Psychosocial Re-Evaluation      09/28/15 1422 12/11/15 1323 01/11/16 1227       Psychosocial Re-Evaluation   Comments When I checked EPIC to chart Rate your plate, I noticed [name] is in the [place] today.  [name]'[name] son called to say [name] fell and broker her tail bone and is very sore. I called and spoke to [name] and  she said she is sorry she can't attend Cardiac Rehab. but she is sore and on pain medicine. I tried to call her son back at [phone] but his vm was full.  [name] left a vm that she is sorry that she has not been able to attend Cardiac Rehab due to several infections.      Continued Psychosocial Services Needed Yes          Vocational Rehabilitation: Provide vocational rehab assistance to qualifying candidates.   Vocational Rehab Evaluation & Intervention:     Vocational Rehab - 09/25/15 1532    Initial Vocational Rehab Evaluation & Intervention   Assessment shows need for Vocational Rehabilitation No      Education: Education Goals: Education classes will be provided on a weekly basis, covering required topics. Participant will state understanding/return demonstration of topics presented.  Learning  Barriers/Preferences:   Education  Topics: General Nutrition Guidelines/Fats and Fiber: -Group instruction provided by verbal, written material, models and posters to present the general guidelines for heart healthy nutrition. Gives an explanation and review of dietary fats and fiber.   Controlling Sodium/Reading Food Labels: -Group verbal and written material supporting the discussion of sodium use in heart healthy nutrition. Review and explanation with models, verbal and written materials for utilization of the food label.          Cardiac Rehab from 01/15/2016 in [facility] Cardiac and Pulmonary Rehab   Date  12/26/14   Educator  CR   Instruction Review Code  2- meets goals/outcomes      Exercise Physiology & Risk Factors: - Group verbal and written instruction with models to review the exercise physiology of the cardiovascular system and associated critical values. Details cardiovascular disease risk factors and the goals associated with each risk factor.      Cardiac Rehab from 01/15/2016 in [facility] Cardiac and Pulmonary Rehab   Date  11/16/14   Educator  RM   Instruction Review Code  2- meets goals/outcomes      Aerobic Exercise & Resistance Training: - Gives group verbal and written discussion on the health impact of inactivity. On the components of aerobic and resistive training programs and the benefits of this training and how to safely progress through these programs.      Cardiac Rehab from 01/15/2016 in [facility] Cardiac and Pulmonary Rehab   Date  11/23/14   Educator  RM   Instruction Review Code  2- meets goals/outcomes      Flexibility, Balance, General Exercise Guidelines: - Provides group verbal and written instruction on the benefits of flexibility and balance training programs. Provides general exercise guidelines with specific guidelines to those with heart or lung disease. Demonstration and skill practice provided.      Cardiac Rehab from 01/15/2016 in [facility] Cardiac and  Pulmonary Rehab   Date  11/21/14   Educator  SB   Instruction Review Code  2- meets goals/outcomes      Stress Management: - Provides group verbal and written instruction about the health risks of elevated stress, cause of high stress, and healthy ways to reduce stress.      Cardiac Rehab from 01/15/2016 in [facility] Cardiac and Pulmonary Rehab   Date  12/27/15   Educator  [name]   Instruction Review Code  2- meets goals/outcomes      Depression: - Provides group verbal and written instruction on the correlation between heart/lung disease and depressed mood, treatment options, and the stigmas associated with seeking treatment.      Cardiac Rehab from 01/15/2016 in [facility] Cardiac and Pulmonary Rehab   Date  11/09/14   Educator  [facility]   Instruction Review Code  2- meets goals/outcomes      Anatomy & Physiology of the Heart: - Group verbal and written instruction and models provide basic cardiac anatomy and physiology, with the coronary electrical and arterial systems. Review of: AMI, Angina, Valve disease, Heart Failure, Cardiac Arrhythmia, Pacemakers, and the ICD.      Cardiac Rehab from 01/15/2016 in [facility] Cardiac and Pulmonary Rehab   Date  12/25/15   Educator  DW   Instruction Review Code  2- meets goals/outcomes      Cardiac Procedures: - Group verbal and written instruction and models to describe the testing methods done to diagnose heart disease. Reviews the outcomes of the test results. Describes the treatment choices: Medical Management, Angioplasty, or Coronary Bypass Surgery.  Cardiac Rehab from 01/15/2016 in [facility] Cardiac and Pulmonary Rehab   Date  11/07/14   Educator  Foster Simpson RN BSN CCRP   Instruction Review Code  2- meets goals/outcomes      Cardiac Medications: - Group verbal and written instruction to review commonly prescribed medications for heart disease. Reviews the medication, class of the drug, and side effects. Includes the steps to properly store meds and maintain  the prescription regimen.      Cardiac Rehab from 01/15/2016 in [facility] Cardiac and Pulmonary Rehab   Date  01/15/16 Marisue Humble 2]   Educator  DW   Instruction Review Code  2- meets goals/outcomes      Go Sex-Intimacy & Heart Disease, Get SMART - Goal Setting: - Group verbal and written instruction through game format to discuss heart disease and the return to sexual intimacy. Provides group verbal and written material to discuss and apply goal setting through the application of the [name].M.A.R.T. Method.      Cardiac Rehab from 01/15/2016 in [facility] Cardiac and Pulmonary Rehab   Date  11/07/14   Educator  Foster Simpson RN BSN CCRP   Instruction Review Code  2- meets goals/outcomes      Other Matters of the Heart: - Provides group verbal, written materials and models to describe Heart Failure, Angina, Valve Disease, and Diabetes in the realm of heart disease. Includes description of the disease process and treatment options available to the cardiac patient.      Cardiac Rehab from 01/15/2016 in [facility] Cardiac and Pulmonary Rehab   Date  12/14/14   Educator  CE   Instruction Review Code  2- meets goals/outcomes      Exercise & Equipment Safety: - Individual verbal instruction and demonstration of equipment use and safety with use of the equipment.      Cardiac Rehab from 01/15/2016 in [facility] Cardiac and Pulmonary Rehab   Date  09/25/15   Educator  C. EnterkinRN   Instruction Review Code  1- partially meets, needs review/practice      Infection Prevention: - Provides verbal and written material to individual with discussion of infection control including proper hand washing and proper equipment cleaning during exercise session.      Cardiac Rehab from 01/15/2016 in [facility] Cardiac and Pulmonary Rehab   Date  09/25/15   Educator  C. EnterkinRN   Instruction Review Code  2- meets goals/outcomes      Falls Prevention: - Provides verbal and written material to individual with discussion of falls prevention  and safety.      Cardiac Rehab from 01/15/2016 in [facility] Cardiac and Pulmonary Rehab   Date  09/25/15   Educator  C. Halesite   Instruction Review Code  1- partially meets, needs review/practice      Diabetes: - Individual verbal and written instruction to review signs/symptoms of diabetes, desired ranges of glucose level fasting, after meals and with exercise. Advice that pre and post exercise glucose checks will be done for 3 sessions at entry of program.    Knowledge Questionnaire Score:     Knowledge Questionnaire Score - 09/25/15 1532    Knowledge Questionnaire Score   Pre Score 18      Core Components/Risk Factors/Patient Goals at Admission:     Personal Goals and Risk Factors at Admission - 09/25/15 1536    Core Components/Risk Factors/Patient Goals on Admission   Increase Strength and Stamina Yes   Intervention Provide advice, education, support and counseling about physical activity/exercise needs.;Develop  an individualized exercise prescription for aerobic and resistive training based on initial evaluation findings, risk stratification, comorbidities and participant'[name] personal goals.   Expected Outcomes Achievement of increased cardiorespiratory fitness and enhanced flexibility, muscular endurance and strength shown through measurements of functional capacity and personal statement of participant.   Heart Failure Yes   Intervention Provide a combined exercise and nutrition program that is supplemented with education, support and counseling about heart failure. Directed toward relieving symptoms such as shortness of breath, decreased exercise tolerance, and extremity edema.   Expected Outcomes Improve functional capacity of life;Short term: Attendance in program 2-3 days a week with increased exercise capacity. Reported lower sodium intake. Reported increased fruit and vegetable intake. Reports medication compliance.;Short term: Daily weights obtained and reported for  increase. Utilizing diuretic protocols set by physician.;Long term: Adoption of self-care skills and reduction of barriers for early signs and symptoms recognition and intervention leading to self-care maintenance.      Core Components/Risk Factors/Patient Goals Review:      Goals and Risk Factor Review      10/18/15 1900 12/11/15 1322 01/15/16 1758       Core Components/Risk Factors/Patient Goals Review   Personal Goals Review  Sedentary Heart Failure     Review Was in  hospital [name]'[name] son called to say [name] fell and broker her tail bone and is very sore. She said she is sorry she can't attend Cardiac Rehab. but she is sore and on pain medicine. I tried to call her son back at [phone] but his vm was full.  [name] sees Dr Sabra Heck every 1-2 weeks to monitor CHF.     Expected Outcomes  Able to return to Cardiac Rehab eventually.  [name] will comtinue to control CHF by following Dr.s advice and using meds as directed.        Core Components/Risk Factors/Patient Goals at Discharge (Final Review):      Goals and Risk Factor Review - 01/15/16 1758    Core Components/Risk Factors/Patient Goals Review   Personal Goals Review Heart Failure   Review [name] sees Dr Sabra Heck every 1-2 weeks to monitor CHF.   Expected Outcomes [name] will comtinue to control CHF by following Dr.s advice and using meds as directed.      ITP Comments:     ITP Comments      09/26/15 0825 09/28/15 1422 10/22/15 1012 11/19/15 1112 12/11/15 1318   ITP Comments 30 day review   Continue with ITP    New start to program has attended medical review orientation. When I checked EPIC to chart Rate your plate, I noticed Katiria Calame is in the [facility] Emer[facility] Dept. 30 Day review. Continue with ITP  Last visit March 29,2017 30 day review. Continue with ITP. Remains out after hospital admit for HF.  Waiting for MD clearance. [name]'[name] son called to say Shacarra fell and broker her tail bone and is very sore.  She said she is sorry she can't attend Cardiac Rehab. but she is sore and on pain medicine. I tried to call her son back at [phone] but his vm was full.      12/14/15 1438 12/20/15 0908 01/11/16 1226 01/17/16 0820     ITP Comments patient has not returned to cardiac rehab - last session 09/27/15 30 day review.  Continue with ITP   Remains out, expected to return since MD has released her for exercise Carrie Sattar left a vm that she is sorry that she has not been able to attend  Cardiac Rehab due to several infections.  30 day review. Continue with ITP. Has returned to program       Comments:

## 2016-01-18 ENCOUNTER — Ambulatory Visit: Payer: Medicare Other

## 2016-01-18 ENCOUNTER — Encounter: Payer: Self-pay | Admitting: *Deleted

## 2016-01-18 DIAGNOSIS — I5042 Chronic combined systolic (congestive) and diastolic (congestive) heart failure: Secondary | ICD-10-CM

## 2016-01-18 DIAGNOSIS — Z952 Presence of prosthetic heart valve: Secondary | ICD-10-CM

## 2016-01-18 NOTE — Progress Notes (Signed)
[name], Carrie ([id number]) Visit Report for 01/17/2016 Chief Complaint Document Details [name], Carrie Date of Service: 01/17/2016 9:15 AM Patient Name: C. Patient Account Number: [id number] Medical Record Treating RN: Cornell Barman [id number] Number: Other Clinician: Nov 26, 1934 (81 y.o. Treating [name], [name] Date of Birth/Sex: Female) Physician/Extender: G Primary Care [name] Physician: Referring Physician: [name] in Treatment: 8 Information Obtained from: Patient Chief Complaint Patient is here for review of bilateral lower extremity wounds dating back to December 2016 Electronic Signature(s) Signed: 01/17/2016 4:29:49 PM By: [name] MD Entered By: [name] on 01/17/2016 10:29:21 [name] ([id number]) -------------------------------------------------------------------------------- Debridement Details [name] Date of Service: 01/17/2016 9:15 AM Patient Name: C. Patient Account Number: [id number] Medical Record Treating RN: Cornell Barman [id number] Number: Other Clinician: May 25, 1935 (81 y.o. Treating [name], [name] Date of Birth/Sex: Female) Physician/Extender: G Primary Care [name] Physician: Referring Physician: [name] in Treatment: 8 Debridement Performed for Wound #1 Left,Medial Lower Leg Assessment: Performed By: Physician [name], MD Debridement: Debridement Pre-procedure Yes Verification/Time Out Taken: Start Time: 09:58 Pain Control: Other : lidocaine 4% Level: Skin/Subcutaneous Tissue Total Area Debrided (L x 0.7 (cm) x 0.5 (cm) = 0.35 (cm) W): Tissue and other Viable, Non-Viable, Eschar, Exudate, Fibrin/Slough material debrided: Instrument: Curette Bleeding: Minimum Hemostasis Achieved: Pressure End Time: 10:00 Procedural Pain: 9 Post Procedural Pain: 9 Response to Treatment: Procedure was tolerated well Post Debridement Measurements of Total Wound Length:  (cm) 2.1 Width: (cm) 1.2 Depth: (cm) 0.1 Volume: (cm) 0.198 Post Procedure Diagnosis Same as Pre-procedure Electronic Signature(s) Signed: 01/17/2016 4:29:49 PM By: [name] MD Signed: 01/18/2016 8:04:32 AM By: Gretta Cool RN, BSN, Kim RN, BSN Entered By: [name] on 01/17/2016 10:28:38 [name], Carrie ([id number]) [place], [name] Carrie ([id number]) -------------------------------------------------------------------------------- Debridement Details [name] Date of Service: 01/17/2016 9:15 AM Patient Name: C. Patient Account Number: [id number] Medical Record Treating RN: Cornell Barman [id number] Number: Other Clinician: [birth year] (81 y.o. Treating [name], [name] Date of Birth/Sex: Female) Physician/Extender: G Primary Care [name] Physician: Referring Physician: [name] in Treatment: 8 Debridement Performed for Wound #2 Right,Lateral Lower Leg Assessment: Performed By: Physician [name], MD Debridement: Debridement Pre-procedure Yes Verification/Time Out Taken: Start Time: 09:58 Pain Control: Other : iidocaine 4% Level: Skin/Subcutaneous Tissue Total Area Debrided (L x 1.6 (cm) x 0.8 (cm) = 1.28 (cm) W): Tissue and other Exudate, Fibrin/Slough material debrided: Instrument: Curette Bleeding: Minimum Hemostasis Achieved: Pressure End Time: 10:00 Procedural Pain: 9 Post Procedural Pain: 9 Response to Treatment: Procedure was tolerated well Post Debridement Measurements of Total Wound Length: (cm) 2.1 Width: (cm) 1.1 Depth: (cm) 0.1 Volume: (cm) 0.181 Post Procedure Diagnosis Same as Pre-procedure Electronic Signature(s) Signed: 01/17/2016 4:29:49 PM By: [name] MD Signed: 01/18/2016 8:04:32 AM By: Gretta Cool RN, BSN, Kim RN, BSN Entered By: [name] on 01/17/2016 10:28:51 [place], [name] Carrie ([id number]) Carrie, [name].  ([id number]) -------------------------------------------------------------------------------- Debridement Details [name] Date of Service: 01/17/2016 9:15 AM Patient Name: C. Patient Account Number: [id number] Medical Record Treating RN: Cornell Barman [id number] Number: Other Clinician: [birth year] (81 y.o. Treating [name], [place] Date of Birth/Sex: Female) Physician/Extender: G Primary Care [name] Physician: Referring Physician: [name] in Treatment: 8 Debridement Performed for Wound #3 Left,Proximal,Anterior Lower Leg Assessment: Performed By: Physician [name], MD Debridement: Debridement Pre-procedure Yes Verification/Time Out Taken: Start Time: 09:58 Pain Control: Other : lidocaine 4% Level: Skin/Subcutaneous Tissue Total Area Debrided (L x 2.5 (cm) x 0.5 (cm) = 1.25 (cm) W): Tissue and other  Viable, Non-Viable, Eschar, Exudate, Fibrin/Slough material debrided: Instrument: Curette Bleeding: Minimum Hemostasis Achieved: Pressure End Time: 10:00 Procedural Pain: 9 Post Procedural Pain: 9 Response to Treatment: Procedure was tolerated well Post Debridement Measurements of Total Wound Length: (cm) 2.5 Width: (cm) 0.5 Depth: (cm) 0.1 Volume: (cm) 0.098 Post Procedure Diagnosis Same as Pre-procedure Electronic Signature(s) Signed: 01/17/2016 4:29:49 PM By: [name] MD Signed: 01/18/2016 8:04:32 AM By: Gretta Cool RN, BSN, Kim RN, BSN Entered By: [name] on 01/17/2016 10:29:05 [name], Carrie ([id number]) [place], [place]. ([id number]) -------------------------------------------------------------------------------- HPI Details [name] Date of Service: 01/17/2016 9:15 AM Patient Name: C. Patient Account Number: [id number] Medical Record Treating RN: Cornell Barman [id number] Number: Other Clinician: 04/09/1935 (81 y.o. Treating [name], [place] Date of Birth/Sex: Female) Physician/Extender: G [place] Physician: Referring Physician: [name] in Treatment: 8 History of Present Illness HPI Description: 11/22/15; this is Delong is a 81 year old woman who lives at home on her own. According to the patient and her daughter was present she has had long-standing edema in her legs dating back many years. She also has a history of chronic systolic heart failure, atrial fibrillation and is status post mitral valve replacement. [facility] showed an ejection fraction of 40-45% she is on Lasix 60 mg a day and spironolactone 25 mg a day. Her current problem began in December around Christmas time she developed a small hematoma in the medial part of her left leg which rapidly expanded to a very large hematoma that required surgical debridement. This situation was complicated by the fact that the patient is on long-standing Coumadin for mechanical heart valve. She went to the OR had this evacuated on January 4 17. The wound has gradually improved however she has developed a small wounds around this area and more recently a wound on the right lateral leg. She is weeping edema fluid. The patient is already been to see vascular surgery. It was recommended that she wear Unna boots, she did not tolerate this due to pain in the left leg. She was then prescribed Juzo stockings and really can't get these on herself although truthfully there is probably too much edema for a Juzo stockings currently. She is not a diabetic and has no history of PAD or claudication that I could elicit. She does not use the external compression pumps reliably. She comes today with notes from her primary physician and Dr. Ronalee Belts both recommending various forms of compression but the patient does not really complied with them. Has been using the external compression pumps with some regularity but certainly not daily on the right leg and this has helped. I also note that her  daughter tells me the history that she does not sleep in bed at home. She has a hospital bed but with her legs up she finds this painful so she sleeps in the couch sitting up with her legs dependent. 11/29/15; the patient is arrives today accompanied by her son. He expresses satisfaction that she is maintained the compression all week. 12/06/15; the patient has kept her Profore light wraps on, we have good edema control no major change in the wounds we have been using Aquacel 12/13/15; changed to RTD last week. One of the 3 wounds on her left medial leg is healed she has 2 remaining wounds here and one on the right lateral leg. 12/18/2015 -- the patient was at Dr. Nino Parsley office today and he was seeing her for an arterial study.  While the wrap was being removed she had an inadvertent laceration of the left proximal anterior leg which bled quite profusely and a compression dressing was applied over this and the patient was here to get her Profore wraps done. I was asked to see the patient to make an assessment and treat appropriately. 12/26/15; the patient's injury on the left proximal leg and Steri-Strips removed after soaking. There is an open area here. The original wounds to still open on the right lateral and left medial leg. 01/03/16 patient's injury on her proximal left leg looks quite good. Still small open area on the medial left leg which appears to be improving. The area on the right lateral leg still substantially open with no real improvement in wound depth. Her edema control is marginal with a Profore light. We have been using RTD [place], Larken C. ([id number]) for 3-4 weeks without any major change 01/10/16: wounds without s/s of infection. vascular results are pending regarding arterial studies. 01/17/16; patient comes in today complaining of severe pain however I think most of this is in the right hip not related to her wounds. She continues with a oval-shaped wound on the right  lateral leg, trauma to the left anterior leg just below her tibial plateau. She has a smaller eschar on the left anterior leg. 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 Patient Account Number: [id number] Medical Record Treating RN: Cornell Barman [id number] Number: Other Clinician: [birth year] (81 y.o. Treating [name], [place] Date of Birth/Sex: Female) Physician/Extender: G Primary [place] Physician: Referring Physician: [name] in Treatment: 8 Notes Patient's edema is under fairly good control. She has a chronic combination of venous insufficiency and lymphedema. The area on the right lateral leg is a small open wound but was some depth, debridement of surface slough and nonviable subcutaneous tissue The trauma wound on the left anterior leg considerably better post debridement. Small surface eschar removed on the right anterior medial leg also has a small open area. Electronic Signature(s) Signed: 01/17/2016 4:29:49 PM By: [name] MD Entered By: [name] on 01/17/2016 10:31:34 [name] ([id number]) -------------------------------------------------------------------------------- Physician Orders Details [name] Date of Service: 01/17/2016 9:15 AM Patient Name: C. Patient Account Number: [id number] Medical Record Treating RN: Cornell Barman [id number] Number: Other Clinician: 1934-12-29 (81 y.o. Treating [name], [name] Date of Birth/Sex: Female) Physician/Extender: G Primary  Care [name] Physician: Referring Physician: [name] in Treatment: 8 Verbal / Phone Orders: No Diagnosis Coding Primary Wound Dressing Wound #1 Left,Medial Lower Leg o Promogran Wound #2 Right,Lateral Lower Leg o Promogran Wound #3 Left,Proximal,Anterior Lower Leg o Promogran Electronic Signature(s) Signed: 01/17/2016 4:29:49 PM By: [name] MD Signed: 01/18/2016 8:04:32 AM By: Gretta Cool RN, BSN, Kim RN, BSN Entered By: Gretta Cool, RN, BSN, Kim on 01/17/2016 10:10:12 [name] ([id number]) -------------------------------------------------------------------------------- Problem List Details [name] Date of Service: 01/17/2016 9:15 AM Patient Name: C. Patient Account Number: [id number] Medical Record Treating RN: Cornell Barman [id number] Number: Other Clinician: 1935/04/09 (81 y.o. Treating [name], [place] Date of Birth/Sex: Female) Physician/Extender: G Primary Care [name] Physician: Referring Physician: [name] in Treatment: 8 Active Problems ICD-10 Encounter Code Description  Active Date Diagnosis I87.333 Chronic venous hypertension (idiopathic) with ulcer and 11/22/2015 Yes inflammation of bilateral lower extremity I89.0 Lymphedema, not elsewhere classified 11/22/2015 Yes [id number] Chronic systolic (congestive) heart failure 11/22/2015 Yes S81.812A Laceration without foreign body, left lower leg, initial 12/18/2015 Yes encounter Inactive Problems Resolved Problems Electronic Signature(s) Signed: 01/17/2016 4:29:49 PM By: [name] MD Entered By: [name] on 01/17/2016 10:28:05 [name] ([id number]) -------------------------------------------------------------------------------- Progress Note Details [name] Date of Service: 01/17/2016 9:15 AM Patient Name: C. Patient Account Number: [id number] Medical Record Treating RN: Cornell Barman [id number] Number: Other Clinician: [birth year] (81  y.o. Treating [name], [place] Date of Birth/Sex: Female) Physician/Extender: G Primary Care [name] Physician: Referring Physician: [name] in Treatment: 8 Subjective Chief Complaint Information obtained from Patient Patient is here for review of bilateral lower extremity wounds dating back to December 2016 History of Present Illness (HPI) 11/22/15; this is Chaires is a 81 year old woman who lives at home on her own. According to the patient and her daughter was present she has had long-standing edema in her legs dating back many years. She also has a history of chronic systolic heart failure, atrial fibrillation and is status post mitral valve replacement. [facility] showed an ejection fraction of 40-45% she is on Lasix 60 mg a day and spironolactone 25 mg a day. Her current problem began in December around Christmas time she developed a small hematoma in the medial part of her left leg which rapidly expanded to a very large hematoma that required surgical debridement. This situation was complicated by the fact that the patient is on long-standing Coumadin for mechanical heart valve. She went to the OR had this evacuated on January 4 17. The wound has gradually improved however she has developed a small wounds around this area and more recently a wound on the right lateral leg. She is weeping edema fluid. The patient is already been to see vascular surgery. It was recommended that she wear Unna boots, she did not tolerate this due to pain in the left leg. She was then prescribed Juzo stockings and really can't get these on herself although truthfully there is probably too much edema for a Juzo stockings currently. She is not a diabetic and has no history of PAD or claudication that I could elicit. She does not use the external compression pumps reliably. She comes today with notes from her primary physician and Dr. Ronalee Belts both recommending  various forms of compression but the patient does not really complied with them. Has been using the external compression pumps with some regularity but certainly not daily on the right leg and this has helped. I also note that her daughter tells me the history that she does not sleep in bed at home. She has a hospital bed but with her legs up she finds this painful so she sleeps in the couch sitting up with her legs dependent. 11/29/15; the patient is arrives today accompanied by her son. He expresses satisfaction that she is maintained the compression all week. 12/06/15; the patient has kept her Profore light wraps on, we have good edema control no major change in the wounds we have been using Aquacel 12/13/15; changed to RTD last week. One of the 3 wounds on her left medial leg is healed she has 2 remaining wounds here and one on the right lateral leg. 12/18/2015 -- the patient was at Dr. Nino Parsley office today and he was seeing her for an arterial  study. While the wrap was being removed she had an inadvertent laceration of the left proximal anterior leg which bled quite profusely and a compression dressing was applied over this and the patient was here to get her Profore wraps done. I was asked to see the patient to make an assessment and treat appropriately. KELAHNI, VATTER ([id number]) 12/26/15; the patient's injury on the left proximal leg and Steri-Strips removed after soaking. There is an open area here. The original wounds to still open on the right lateral and left medial leg. 01/03/16 patient's injury on her proximal left leg looks quite good. Still small open area on the medial left leg which appears to be improving. The area on the right lateral leg still substantially open with no real improvement in wound depth. Her edema control is marginal with a Profore light. We have been using RTD for 3-4 weeks without any major change 01/10/16: wounds without s/s of infection. vascular results  are pending regarding arterial studies. 01/17/16; patient comes in today complaining of severe pain however I think most of this is in the right hip not related to her wounds. She continues with a oval-shaped wound on the right lateral leg, trauma to the left anterior leg just below her tibial plateau. She has a smaller eschar on the left anterior leg. 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 Integumentary (Hair, Skin) Wound #1 status is Open. Original cause of wound was Gradually Appeared. The wound is located on the Left,Medial Lower Leg. The wound measures 0.7cm length x 0.5cm width x 0.1cm depth; 0.275cm^2 area and 0.027cm^3 volume. The wound is limited to skin breakdown. There is no tunneling or undermining noted. There is a none present amount of drainage noted. The wound margin is distinct with the outline attached to the wound base. There is medium (34-66%) red granulation within the wound bed. There is a medium (34-66%) amount of necrotic tissue within the wound bed including Eschar. The periwound skin appearance exhibited: Moist. The periwound skin appearance did not exhibit: Callus, Crepitus, Excoriation, Fluctuance, Friable, Induration, Localized Edema, Rash, Scarring, Dry/Scaly, Maceration, Atrophie Blanche, Cyanosis, Ecchymosis, Hemosiderin Staining, Mottled, Pallor, Rubor, Erythema. Periwound temperature was noted as No Abnormality. The periwound has tenderness on palpation. Wound #2 status is Open. Original cause of wound was Gradually Appeared. The wound is located on the Right,Lateral Lower Leg. The wound measures 1.6cm length x 0.8cm width  x 0.03cm depth; 1.005cm^2 area and 0.03cm^3 volume. The wound is limited to skin breakdown. There is no tunneling or undermining noted. There is a small amount of serosanguineous drainage noted. The wound margin is distinct with the outline attached to the wound base. There is medium (34-66%) pink granulation within the wound bed. There is a medium (34-66%) amount of necrotic tissue within the wound bed including Adherent Slough. The periwound skin appearance exhibited: Localized Edema, Moist. The periwound skin appearance did not exhibit: Callus, Crepitus, Excoriation, Fluctuance, Friable, Induration, Rash, Scarring, Dry/Scaly, Maceration, Atrophie Blanche, Cyanosis, Ecchymosis, Hemosiderin Staining, Mottled, Pallor, Rubor, Shrieves, Myron C. ([id number]) Erythema. Periwound temperature was noted as No Abnormality. The periwound has tenderness on palpation. Wound #3 status is Open. Original cause of wound was Trauma. The  wound is located on the Left,Proximal,Anterior Lower Leg. The wound measures 2.5cm length x 0.5cm width x 0.1cm depth; 0.982cm^2 area and 0.098cm^3 volume. The wound is limited to skin breakdown. There is no tunneling or undermining noted. There is a none present amount of drainage noted. The wound margin is flat and intact. There is medium (34-66%) red, pink granulation within the wound bed. There is a medium (34-66%) amount of necrotic tissue within the wound bed including Eschar. The periwound skin appearance exhibited: Localized Edema. The periwound skin appearance did not exhibit: Moist. Periwound temperature was noted as No Abnormality. The periwound has tenderness on palpation. Assessment Active Problems ICD-10 I87.333 - Chronic venous hypertension (idiopathic) with ulcer and inflammation of bilateral lower extremity I89.0 - Lymphedema, not elsewhere classified [id number] - Chronic systolic (congestive) heart failure [id number] - Laceration without foreign body, left  lower leg, initial encounter Procedures Wound #1 Wound #1 is a Lymphedema located on the Left,Medial Lower Leg . There was a Skin/Subcutaneous Tissue Debridement [id number]) debridement with total area of 0.35 sq cm performed by [name], MD. with the following instrument(s): Curette to remove Viable and Non-Viable tissue/material including Exudate, Fibrin/Slough, and Eschar after achieving pain control using Other (lidocaine 4%). A time out was conducted prior to the start of the procedure. A Minimum amount of bleeding was controlled with Pressure. The procedure was tolerated well with a pain level of 9 throughout and a pain level of 9 following the procedure. Post Debridement Measurements: 2.1cm length x 1.2cm width x 0.1cm depth; 0.198cm^3 volume. Post procedure Diagnosis Wound #1: Same as Pre-Procedure Wound #2 Wound #2 is a Lymphedema located on the Right,Lateral Lower Leg . There was a Skin/Subcutaneous Tissue Debridement [id number]) debridement with total area of 1.28 sq cm performed by [name], MD. with the following instrument(s): Curette including Exudate and Fibrin/Slough after achieving pain control using Other (iidocaine 4%). A time out was conducted prior to the start of the procedure. A Minimum amount of bleeding was controlled with Pressure. The procedure was tolerated well Everetts, Breanda C. ([id number]) with a pain level of 9 throughout and a pain level of 9 following the procedure. Post Debridement Measurements: 2.1cm length x 1.1cm width x 0.1cm depth; 0.181cm^3 volume. Post procedure Diagnosis Wound #2: Same as Pre-Procedure Wound #3 Wound #3 is a Trauma, Other located on the Left,Proximal,Anterior Lower Leg . There was a Skin/Subcutaneous Tissue Debridement [id number]) debridement with total area of 1.25 sq cm performed by [name], MD. with the following instrument(s): Curette to remove Viable and Non-Viable tissue/material including  Exudate, Fibrin/Slough, and Eschar after achieving pain control using Other (lidocaine 4%). A time out was conducted prior to the start of the procedure. A Minimum amount of bleeding was controlled with Pressure. The procedure was tolerated well with a pain level of 9 throughout and a pain level of 9 following the procedure. Post Debridement Measurements: 2.5cm length x 0.5cm width x 0.1cm depth; 0.098cm^3 volume. Post procedure Diagnosis Wound #3: Same as Pre-Procedure Plan Primary Wound Dressing: Wound #1 Left,Medial Lower Leg: Promogran Wound #2 Right,Lateral Lower Leg: Promogran Wound #3 Left,Proximal,Anterior Lower Leg: Promogran change to promogran in view of newly reported silver allergy. wounds imporved Electronic Signature(s) Signed: 01/17/2016 4:29:49 PM By: [name] MD Entered By: [name] on 01/17/2016 10:32:30 Cangelosi, [name] Carrie ([id number]) -------------------------------------------------------------------------------- SuperBill Details Patient Name: [name] Date of Service: 01/17/2016 Medical Record Patient Account Number: [id number] Number: Treating RN: Cornell Barman December 15, 1934 [id number] y.o. Other  Clinician: Date of Birth/Sex: Female) Treating [name], [name] Primary Care Physician/Extender: Claudette Laws Physician: Suella Grove in Treatment: 8 Referring Physician: [name] Diagnosis Coding ICD-10 Codes Code Description Chronic venous hypertension (idiopathic) with ulcer and inflammation of bilateral lower I87.333 extremity I89.0 Lymphedema, not elsewhere classified [id number] Chronic systolic (congestive) heart failure S81.812A Laceration without foreign body, left lower leg, initial encounter Facility Procedures CPT4: Description Modifier Quantity Code [id number] - DEB SUBQ TISSUE 20 SQ CM/< 1 ICD-10 Description Diagnosis I87.333 Chronic venous hypertension (idiopathic) with ulcer and inflammation of bilateral lower  extremity Physician Procedures CPT4: Description Modifier Quantity Code [id number] - WC PHYS SUBQ TISS 20 SQ CM 1 ICD-10 Description Diagnosis I87.333 Chronic venous hypertension (idiopathic) with ulcer and inflammation of bilateral lower extremity Electronic Signature(s) Signed: 01/17/2016 4:29:49 PM By: [name] MD Entered By: [name] on 01/17/2016 10:32:50

## 2016-01-18 NOTE — Progress Notes (Signed)
Cardiac Individual Treatment Plan  Patient Details  Name: NAHOMY LIMBURG MRN: [id number] Date of Birth: [birth year] Referring Provider:    Initial Encounter Date:       Cardiac Rehab from 09/25/2015 in [facility] Cardiac and Pulmonary Rehab   Date  09/25/15      Visit Diagnosis: S/P MVR (mitral valve replacement)  Chronic combined systolic and diastolic congestive heart failure ([place])  Patient's Home Medications on Admission:  Current outpatient prescriptions:  .  acetaminophen (TYLENOL) 500 MG tablet, Take 500 mg by mouth every 6 (six) hours as needed for mild pain, moderate pain, fever or headache., Disp: , Rfl:  .  amiodarone (PACERONE) 200 MG tablet, Take 200 mg by mouth daily., Disp: , Rfl:  .  aspirin EC 81 MG tablet, Take 81 mg by mouth daily. , Disp: , Rfl:  .  furosemide (LASIX) 40 MG tablet, Take 60 mg by mouth daily., Disp: , Rfl:  .  gabapentin (NEURONTIN) 100 MG capsule, Take 1 capsule (100 mg total) by mouth at bedtime., Disp: 90 capsule, Rfl: 0 .  metoprolol succinate (TOPROL-XL) 25 MG 24 hr tablet, Take 1 tablet (25 mg total) by mouth daily., Disp: 30 tablet, Rfl: 2 .  Multiple Vitamin (MULTI-VITAMINS) TABS, Take 1 tablet by mouth daily. , Disp: , Rfl:  .  pantoprazole (PROTONIX) 40 MG tablet, Take 40 mg by mouth daily. , Disp: , Rfl:  .  potassium chloride (K-DUR) 10 MEQ tablet, Take by mouth., Disp: , Rfl:  .  spironolactone (ALDACTONE) 25 MG tablet, Take 25 mg by mouth daily., Disp: , Rfl:  .  traMADol (ULTRAM) 50 MG tablet, TAKE 1/2 TO 1 TABLET BY MOUTH TWICE A DAY AS NEEDED FOR PAIN, Disp: , Rfl: 5 .  vitamin C (ASCORBIC ACID) 500 MG tablet, Take 500 mg by mouth daily as needed (for cold symptoms). , Disp: , Rfl:  .  warfarin (COUMADIN) 5 MG tablet, Take 1 tablet (5 mg total) by mouth daily., Disp: 30 tablet, Rfl: 2  Past Medical History: Past Medical History  Diagnosis Date  . Shortness of breath dyspnea   . Hypertension   . Chronic combined systolic  (congestive) and diastolic (congestive) heart failure (HCC)     a. EF 25% by echo in 08/2015 b. RHC in 08/2015 showed normal filling pressures  . GERD (gastroesophageal reflux disease)   . Arthritis   . Chronic atrial fibrillation ([place])     a. On Coumadin  . S/P MVR (mitral valve replacement)     a. MVR 1994 b. redo MVR in 08/2014 - on Coumadin    Tobacco Use: History  Smoking status  . Former Smoker  Smokeless tobacco  . Never Used    Labs: Recent Review Flowsheet Data    There is no flowsheet data to display.       Exercise Target Goals:    Exercise Program Goal: Individual exercise prescription set with THRR, safety & activity barriers. Participant demonstrates ability to understand and report RPE using BORG scale, to self-measure pulse accurately, and to acknowledge the importance of the exercise prescription.  Exercise Prescription Goal: Starting with aerobic activity 30 plus minutes a day, 3 days per week for initial exercise prescription. Provide home exercise prescription and guidelines that participant acknowledges understanding prior to discharge.  Activity Barriers & Risk Stratification:     Activity Barriers & Cardiac Risk Stratification - 09/25/15 1531    Activity Barriers & Cardiac Risk Stratification   Activity Barriers Arthritis;Joint Problems  Cardiac Risk Stratification High      6 Minute Walk:     6 Minute Walk      09/25/15 1509       6 Minute Walk   Phase Initial  NuStep Test      Distance 413 feet  Nu Step Test Level 1 with arms     Walk Time 6 minutes     # of Rest Breaks 0     RPE 13     Symptoms No     Resting HR 111 bpm     Resting BP 132/60 mmHg     Max Ex. HR 116 bpm     Max Ex. BP 138/54 mmHg        Initial Exercise Prescription:     Initial Exercise Prescription - 09/25/15 1500    Date of Initial Exercise RX and Referring Provider   Date 09/25/15   Treadmill   MPH 1  Use caution with treadmill and small time  intervals   Grade 0   Minutes 3   Recumbant Bike   Level 2   RPM 40   Watts 15   Minutes 10   NuStep   Level 2   Watts 20   Minutes 15   Arm Ergometer   Level 1   Watts 10   Minutes 15   Arm/Foot Ergometer   Level 4   Watts 12   Minutes 15   Recumbant Elliptical   Level 1   RPM 35   Watts 10   Minutes 10   REL-XR   Level 2   Watts 30   Minutes 15   T5 Nustep   Level 2   Watts 15   Minutes 15   Biostep-RELP   Level 2   Watts 15   Minutes 15   Prescription Details   Frequency (times per week) 3   Duration Progress to 30 minutes of continuous aerobic without signs/symptoms of physical distress   Intensity   THRR REST +  30   Ratings of Perceived Exertion 11-15   Progression   Progression Continue progressive overload as per policy without signs/symptoms or physical distress.   Resistance Training   Training Prescription Yes   Weight 2   Reps 10-15      Perform Capillary Blood Glucose checks as needed.  Exercise Prescription Changes:     Exercise Prescription Changes      12/29/15 1000 01/11/16 1200         Exercise Review   Progression  Yes      Response to Exercise   Blood Pressure (Admit) 114/56 mmHg 142/80 mmHg      Blood Pressure (Exercise) 102/50 mmHg 112/52 mmHg      Blood Pressure (Exit) 122/64 mmHg 116/60 mmHg      Heart Rate (Admit) 58 bpm 58 bpm      Heart Rate (Exercise) 60 bpm 78 bpm      Heart Rate (Exit) 66 bpm 54 bpm      Rating of Perceived Exertion (Exercise) 13 13      Duration Progress to 30 minutes of continuous aerobic without signs/symptoms of physical distress Progress to 45 minutes of aerobic exercise without signs/symptoms of physical distress      Intensity THRR unchanged       Progression   Progression Continue to progress workloads to maintain intensity without signs/symptoms of physical distress. Continue to progress workloads to maintain intensity without signs/symptoms of physical distress.  Resistance  Training   Training Prescription Yes Yes      Weight 2 1      Reps 10-15 10-15      Treadmill   MPH 1       Grade 0       Minutes 5       NuStep   Level 4 4      Watts  24      Minutes 25 35         Exercise Comments:     Exercise Comments      10/20/15 1158 11/16/15 1540 12/29/15 1020 01/11/16 1245     Exercise Comments Breyana has not attended Heart Track since 09-27-15. Goal will be to resume program when able. Ileta has not attended since 3-29. Ms Bulluck has returned after a 3 month absence.  Her exercise prescription was reviewed as well as equipment safety. Margaet is progresing well with exercise duration and has added TM for short periods.       Discharge Exercise Prescription (Final Exercise Prescription Changes):     Exercise Prescription Changes - 01/11/16 1200    Exercise Review   Progression Yes   Response to Exercise   Blood Pressure (Admit) 142/80 mmHg   Blood Pressure (Exercise) 112/52 mmHg   Blood Pressure (Exit) 116/60 mmHg   Heart Rate (Admit) 58 bpm   Heart Rate (Exercise) 78 bpm   Heart Rate (Exit) 54 bpm   Rating of Perceived Exertion (Exercise) 13   Duration Progress to 45 minutes of aerobic exercise without signs/symptoms of physical distress   Progression   Progression Continue to progress workloads to maintain intensity without signs/symptoms of physical distress.   Resistance Training   Training Prescription Yes   Weight 1   Reps 10-15   NuStep   Level 4   Watts 24   Minutes 35      Nutrition:  Target Goals: Understanding of nutrition guidelines, daily intake of sodium <1562m, cholesterol <2052m calories 30% from fat and 7% or less from saturated fats, daily to have 5 or more servings of fruits and vegetables.  Biometrics:     Pre Biometrics - 09/25/15 1508    Pre Biometrics   Height 5' 5.5" (1.664 m)   Weight 186 lb 1.6 oz (84.414 kg)   Waist Circumference 34 inches   Hip Circumference 42 inches   Waist to Hip Ratio  0.81 %   BMI (Calculated) 30.6       Nutrition Therapy Plan and Nutrition Goals:     Nutrition Therapy & Goals - 10/04/15 1707    Personal Nutrition Goals   Comments Ms. Vaneaton prefers not to meet with RD       Nutrition Discharge: Rate Your Plate Scores:     Nutrition Assessments - 09/28/15 1421    Rate Your Plate Scores   Pre Score 65   Pre Score % 72 %      Nutrition Goals Re-Evaluation:   Psychosocial: Target Goals: Acknowledge presence or absence of depression, maximize coping skills, provide positive support system. Participant is able to verbalize types and ability to use techniques and skills needed for reducing stress and depression.  Initial Review & Psychosocial Screening:     Initial Psych Review & Screening - 09/25/15 15WetzelYes      Quality of Life Scores:     Quality of Life - 09/25/15 1541    Quality of  Life Scores   Health/Function Pre 19.5 %   Socioeconomic Pre 26.75 %   Psych/Spiritual Pre 20.64 %   Family Pre 24.88 %   GLOBAL Pre 21.59 %      PHQ-9:     Recent Review Flowsheet Data    Depression screen [facility] 2/9 09/25/2015 01/05/2015   Decreased Interest 0 0   Down, Depressed, Hopeless 0 0   PHQ - 2 Score 0 0   Altered sleeping 3 1   Tired, decreased energy 3 1   Change in appetite 0 0   Feeling bad or failure about yourself  0 0   Trouble concentrating 0 0   Moving slowly or fidgety/restless 0 0   Suicidal thoughts 0 0   PHQ-9 Score 6 2   Difficult doing work/chores Somewhat difficult -      Psychosocial Evaluation and Intervention:     Psychosocial Evaluation - 12/27/15 1714    Psychosocial Evaluation & Interventions   Comments Counselor met with Ms. Dykman for a new psychosocial evaluation.  Ms S was in this same program a little over a year ago and is now back due to some recent health issues.  She continues to have a strong support system with a daughter living in the same home  and a son that is close by and very involved.  Ms. Trimarco reports that she has had to contend a great deal with swelling in her body and was in the hospital for this in March.  She has subsequently had surgery on her leg to remove a growth there and is going to the wound center often for treatment of that.  Ms. Chauncey Cruel reports she is sleeping better lately and has a good appetite.  She denies a history of depression or anxiety, but admits she does have some stress in her life with her adult children arguing quite often over her care.  Her goals for this program are to get the swelling down so the wounds will heal better.  Counselor will follow with Ms. S as needed.        Psychosocial Re-Evaluation:     Psychosocial Re-Evaluation      09/28/15 1422 12/11/15 1323 01/11/16 1227       Psychosocial Re-Evaluation   Comments When I checked EPIC to chart Rate your plate, I noticed Tranesha Lessner is in the [place] today.  Dannika's son called to say Lanora fell and broker her tail bone and is very sore. I called and spoke to Mrs. Landing and  she said she is sorry she can't attend Cardiac Rehab. but she is sore and on pain medicine. I tried to call her son back at [phone] but his vm was full.  Maergaret Curiale left a vm that she is sorry that she has not been able to attend Cardiac Rehab due to several infections.      Continued Psychosocial Services Needed Yes          Vocational Rehabilitation: Provide vocational rehab assistance to qualifying candidates.   Vocational Rehab Evaluation & Intervention:     Vocational Rehab - 09/25/15 1532    Initial Vocational Rehab Evaluation & Intervention   Assessment shows need for Vocational Rehabilitation No      Education: Education Goals: Education classes will be provided on a weekly basis, covering required topics. Participant will state understanding/return demonstration of topics presented.  Learning  Barriers/Preferences:   Education Topics: General Nutrition Guidelines/Fats and Fiber: -Group instruction provided by  verbal, written material, models and posters to present the general guidelines for heart healthy nutrition. Gives an explanation and review of dietary fats and fiber.   Controlling Sodium/Reading Food Labels: -Group verbal and written material supporting the discussion of sodium use in heart healthy nutrition. Review and explanation with models, verbal and written materials for utilization of the food label.          Cardiac Rehab from 01/15/2016 in [facility] Cardiac and Pulmonary Rehab   Date  12/26/14   Educator  CR   Instruction Review Code  2- meets goals/outcomes      Exercise Physiology & Risk Factors: - Group verbal and written instruction with models to review the exercise physiology of the cardiovascular system and associated critical values. Details cardiovascular disease risk factors and the goals associated with each risk factor.      Cardiac Rehab from 01/15/2016 in [facility] Cardiac and Pulmonary Rehab   Date  11/16/14   Educator  RM   Instruction Review Code  2- meets goals/outcomes      Aerobic Exercise & Resistance Training: - Gives group verbal and written discussion on the health impact of inactivity. On the components of aerobic and resistive training programs and the benefits of this training and how to safely progress through these programs.      Cardiac Rehab from 01/15/2016 in [facility] Cardiac and Pulmonary Rehab   Date  11/23/14   Educator  RM   Instruction Review Code  2- meets goals/outcomes      Flexibility, Balance, General Exercise Guidelines: - Provides group verbal and written instruction on the benefits of flexibility and balance training programs. Provides general exercise guidelines with specific guidelines to those with heart or lung disease. Demonstration and skill practice provided.      Cardiac Rehab from 01/15/2016 in [facility] Cardiac and  Pulmonary Rehab   Date  11/21/14   Educator  SB   Instruction Review Code  2- meets goals/outcomes      Stress Management: - Provides group verbal and written instruction about the health risks of elevated stress, cause of high stress, and healthy ways to reduce stress.      Cardiac Rehab from 01/15/2016 in [facility] Cardiac and Pulmonary Rehab   Date  12/27/15   Educator  [facility]   Instruction Review Code  2- meets goals/outcomes      Depression: - Provides group verbal and written instruction on the correlation between heart/lung disease and depressed mood, treatment options, and the stigmas associated with seeking treatment.      Cardiac Rehab from 01/15/2016 in [facility] Cardiac and Pulmonary Rehab   Date  11/09/14   Educator  [facility]   Instruction Review Code  2- meets goals/outcomes      Anatomy & Physiology of the Heart: - Group verbal and written instruction and models provide basic cardiac anatomy and physiology, with the coronary electrical and arterial systems. Review of: AMI, Angina, Valve disease, Heart Failure, Cardiac Arrhythmia, Pacemakers, and the ICD.      Cardiac Rehab from 01/15/2016 in [facility] Cardiac and Pulmonary Rehab   Date  12/25/15   Educator  DW   Instruction Review Code  2- meets goals/outcomes      Cardiac Procedures: - Group verbal and written instruction and models to describe the testing methods done to diagnose heart disease. Reviews the outcomes of the test results. Describes the treatment choices: Medical Management, Angioplasty, or Coronary Bypass Surgery.      Cardiac Rehab from 01/15/2016 in  [facility] Cardiac and Pulmonary Rehab   Date  11/07/14   Educator  Foster Simpson RN BSN CCRP   Instruction Review Code  2- meets goals/outcomes      Cardiac Medications: - Group verbal and written instruction to review commonly prescribed medications for heart disease. Reviews the medication, class of the drug, and side effects. Includes the steps to properly store meds and maintain  the prescription regimen.      Cardiac Rehab from 01/15/2016 in [facility] Cardiac and Pulmonary Rehab   Date  01/15/16 Marisue Humble 2]   Educator  DW   Instruction Review Code  2- meets goals/outcomes      Go Sex-Intimacy & Heart Disease, Get SMART - Goal Setting: - Group verbal and written instruction through game format to discuss heart disease and the return to sexual intimacy. Provides group verbal and written material to discuss and apply goal setting through the application of the S.M.A.R.T. Method.      Cardiac Rehab from 01/15/2016 in [facility] Cardiac and Pulmonary Rehab   Date  11/07/14   Educator  Foster Simpson RN BSN CCRP   Instruction Review Code  2- meets goals/outcomes      Other Matters of the Heart: - Provides group verbal, written materials and models to describe Heart Failure, Angina, Valve Disease, and Diabetes in the realm of heart disease. Includes description of the disease process and treatment options available to the cardiac patient.      Cardiac Rehab from 01/15/2016 in [facility] Cardiac and Pulmonary Rehab   Date  12/14/14   Educator  CE   Instruction Review Code  2- meets goals/outcomes      Exercise & Equipment Safety: - Individual verbal instruction and demonstration of equipment use and safety with use of the equipment.      Cardiac Rehab from 01/15/2016 in [facility] Cardiac and Pulmonary Rehab   Date  09/25/15   Educator  C. EnterkinRN   Instruction Review Code  1- partially meets, needs review/practice      Infection Prevention: - Provides verbal and written material to individual with discussion of infection control including proper hand washing and proper equipment cleaning during exercise session.      Cardiac Rehab from 01/15/2016 in [facility] Cardiac and Pulmonary Rehab   Date  09/25/15   Educator  C. EnterkinRN   Instruction Review Code  2- meets goals/outcomes      Falls Prevention: - Provides verbal and written material to individual with discussion of falls prevention  and safety.      Cardiac Rehab from 01/15/2016 in [facility] Cardiac and Pulmonary Rehab   Date  09/25/15   Educator  C. [place]   Instruction Review Code  1- partially meets, needs review/practice      Diabetes: - Individual verbal and written instruction to review signs/symptoms of diabetes, desired ranges of glucose level fasting, after meals and with exercise. Advice that pre and post exercise glucose checks will be done for 3 sessions at entry of program.    Knowledge Questionnaire Score:     Knowledge Questionnaire Score - 09/25/15 1532    Knowledge Questionnaire Score   Pre Score 18      Core Components/Risk Factors/Patient Goals at Admission:     Personal Goals and Risk Factors at Admission - 09/25/15 1536    Core Components/Risk Factors/Patient Goals on Admission   Increase Strength and Stamina Yes   Intervention Provide advice, education, support and counseling about physical activity/exercise needs.;Develop an individualized exercise prescription for  aerobic and resistive training based on initial evaluation findings, risk stratification, comorbidities and participant's personal goals.   Expected Outcomes Achievement of increased cardiorespiratory fitness and enhanced flexibility, muscular endurance and strength shown through measurements of functional capacity and personal statement of participant.   Heart Failure Yes   Intervention Provide a combined exercise and nutrition program that is supplemented with education, support and counseling about heart failure. Directed toward relieving symptoms such as shortness of breath, decreased exercise tolerance, and extremity edema.   Expected Outcomes Improve functional capacity of life;Short term: Attendance in program 2-3 days a week with increased exercise capacity. Reported lower sodium intake. Reported increased fruit and vegetable intake. Reports medication compliance.;Short term: Daily weights obtained and reported for  increase. Utilizing diuretic protocols set by physician.;Long term: Adoption of self-care skills and reduction of barriers for early signs and symptoms recognition and intervention leading to self-care maintenance.      Core Components/Risk Factors/Patient Goals Review:      Goals and Risk Factor Review      10/18/15 1900 12/11/15 1322 01/15/16 1758       Core Components/Risk Factors/Patient Goals Review   Personal Goals Review  Sedentary Heart Failure     Review Was in  hospital Shasha's son called to say [name] fell and broker her tail bone and is very sore. She said she is sorry she can't attend Cardiac Rehab. but she is sore and on pain medicine. I tried to call her son back at [phone] but his vm was full.  Krystena sees Dr Sabra Heck every 1-2 weeks to monitor CHF.     Expected Outcomes  Able to return to Cardiac Rehab eventually.  Acquanetta will comtinue to control CHF by following Dr.s advice and using meds as directed.        Core Components/Risk Factors/Patient Goals at Discharge (Final Review):      Goals and Risk Factor Review - 01/15/16 1758    Core Components/Risk Factors/Patient Goals Review   Personal Goals Review Heart Failure   Review Kirin sees Dr Sabra Heck every 1-2 weeks to monitor CHF.   Expected Outcomes Sohana will comtinue to control CHF by following Dr.s advice and using meds as directed.      ITP Comments:     ITP Comments      09/26/15 0825 09/28/15 1422 10/22/15 1012 11/19/15 1112 12/11/15 1318   ITP Comments 30 day review   Continue with ITP    New start to program has attended medical review orientation. When I checked EPIC to chart Rate your plate, I noticed Fawne Hughley is in the [facility] Emerg Dept. 30 Day review. Continue with ITP  Last visit March 29,2017 30 day review. Continue with ITP. Remains out after hospital admit for HF.  Waiting for MD clearance. Woodrow's son called to say Allyana fell and broker her tail bone and is very sore.  She said she is sorry she can't attend Cardiac Rehab. but she is sore and on pain medicine. I tried to call her son back at [phone] but his vm was full.      12/14/15 1438 12/20/15 0908 01/11/16 1226 01/17/16 0820 01/18/16 1348   ITP Comments patient has not returned to cardiac rehab - last session 09/27/15 30 day review.  Continue with ITP   Remains out, expected to return since MD has released her for exercise Maergaret Hintz left a vm that she is sorry that she has not been able to attend Cardiac Rehab due to several  infections.  30 day review. Continue with ITP. Has returned to program 30 day review. Continue with ITP. Has returned to program      Comments:

## 2016-01-18 NOTE — Progress Notes (Signed)
Carrie, [name] ([id number]) Visit Report for 01/17/2016 Allergy List Details Patient Name: [name], [name]. Date of Service: 01/17/2016 9:15 AM Medical Record Patient Account Number: [id number] Number: Treating [name]: [name] 11/21/1934 (80 y.o. Other Clinician: Date of Birth/Sex: Female) Treating [name] Primary Care Physician/Extender: [name] Physician: Referring Physician: Melina [name] in Treatment: 8 Allergies Active Allergies penicillin silver Allergy Notes Electronic Signature(s) Signed: 01/18/2016 8:04:32 AM By: Carrie Cool, [name], BSN, [name], BSN Entered By: Carrie Cool, [name], BSN, Carrie on 01/17/2016 09:58:11 [name], [name] ([id number]) -------------------------------------------------------------------------------- Arrival Information Details Patient Name: [name]. Date of Service: 01/17/2016 9:15 AM Medical Record Patient Account Number: [id number] Number: Treating [name]: [name] January 11, 1935 (80 y.o. Other Clinician: Date of Birth/Sex: Female) Treating [name] Primary Care Physician/Extender: [name] Physician: Referring Physician: Melina [name] in Treatment: 8 Visit Information Patient Arrived: Wheel Chair Arrival Time: 09:20 Accompanied By: daughter Transfer Assistance: None Patient Identification Verified: Yes Secondary Verification Process Yes Completed: Patient Requires Transmission- No Based Precautions: Patient Has Alerts: Yes Patient Alerts: Patient on Blood Thinner Warfarin,Non Compressible Silver History Since Last Visit All ordered tests and consults were completed: No Added or deleted any medications: No Any new allergies or adverse reactions: No Had a fall or experienced change in activities of daily living that may affect risk of falls: No Signs or symptoms of abuse/neglect since last visito No Hospitalized since last visit: No Has Dressing in Place as Prescribed:  Yes Has Compression in Place as Prescribed: Yes Electronic Signature(s) Signed: 01/18/2016 8:04:32 AM By: Carrie Cool, [name], BSN, [name], BSN Entered By: Carrie Cool, [name], BSN, Carrie on 01/17/2016 09:57:19 [name], [name] ([id number]) -------------------------------------------------------------------------------- Clinic Level of Care Assessment Details Patient Name: Carrie Mulligan C. Date of Service: 01/17/2016 9:15 AM Medical Record Patient Account Number: [id number] Number: Treating [name]: [name] 07-02-1934 (80 y.o. Other Clinician: Date of Birth/Sex: Female) Treating [name] Primary Care Physician/Extender: [name] Physician: Referring Physician: Melina [name] in Treatment: 8 Clinic Level of Care Assessment Items TOOL 1 Quantity Score []  - Use when EandM and Procedure is performed on INITIAL visit 0 ASSESSMENTS - Nursing Assessment / Reassessment []  - General Physical Exam (combine w/ comprehensive assessment (listed just 0 below) when performed on new pt. evals) []  - Comprehensive Assessment (HX, ROS, Risk Assessments, Wounds Hx, etc.) 0 ASSESSMENTS - Wound and Skin Assessment / Reassessment []  - Dermatologic / Skin Assessment (not related to wound area) 0 ASSESSMENTS - Ostomy and/or Continence Assessment and Care []  - Incontinence Assessment and Management 0 []  - Ostomy Care Assessment and Management (repouching, etc.) 0 PROCESS - Coordination of Care []  - Simple Patient / Family Education for ongoing care 0 []  - Complex (extensive) Patient / Family Education for ongoing care 0 []  - Staff obtains Programmer, systems, Records, Test Results / Process Orders 0 []  - Staff telephones HHA, Nursing Homes / Clarify orders / etc 0 []  - Routine Transfer to another Facility (non-emergent condition) 0 []  - Routine Hospital Admission (non-emergent condition) 0 []  - New Admissions / Biomedical engineer / Ordering NPWT, Apligraf, etc. 0 []  - Emergency Hospital Admission  (emergent condition) 0 PROCESS - Special Needs []  - Pediatric / Minor Patient Management 0 General, [name] ([id number]) []  - Isolation Patient Management 0 []  - Hearing / Language / Visual special needs 0 []  - Assessment of Community assistance (transportation, D/C planning, etc.) 0 []  - Additional assistance / Altered mentation 0 []  - Support Surface(s) Assessment (  bed, cushion, seat, etc.) 0 INTERVENTIONS - Miscellaneous []  - External ear exam 0 []  - Patient Transfer (multiple staff / Civil Service fast streamer / Similar devices) 0 []  - Simple Staple / Suture removal (25 or less) 0 []  - Complex Staple / Suture removal (26 or more) 0 []  - Hypo/Hyperglycemic Management (do not check if billed separately) 0 []  - Ankle / Brachial Index (ABI) - do not check if billed separately 0 Has the patient been seen at the hospital within the last three years: Yes Total Score: 0 Level Of Care: ____ Electronic Signature(s) Signed: 01/18/2016 8:04:32 AM By: Carrie Cool, [name], BSN, [name], BSN Entered By: Carrie Cool, [name], BSN, Carrie on 01/17/2016 10:21:30 [name] ([id number]) -------------------------------------------------------------------------------- Encounter Discharge Information Details Patient Name: Carrie Mulligan C. Date of Service: 01/17/2016 9:15 AM Medical Record Patient Account Number: [id number] Number: Treating [name]: [name] 11/13/1934 (80 y.o. Other Clinician: Date of Birth/Sex: Female) Treating [name] Primary Care Physician/Extender: [name] Physician: Referring Physician: Melina [name] in Treatment: 8 Encounter Discharge Information Items Discharge Pain Level: 8 Discharge Condition: Stable Ambulatory Status: Wheelchair Discharge Destination: Home Transportation: Private Auto Accompanied By: daughter Schedule Follow-up Appointment: No Medication Reconciliation completed and provided to Patient/Care No [name]: Provided on Clinical Summary of  Care: 01/17/2016 Form Type Recipient Paper Patient MS Electronic Signature(s) Signed: 01/18/2016 8:04:32 AM By: Carrie Cool [name], BSN, [name], BSN Previous Signature: 01/17/2016 10:20:32 AM Version By: Ruthine Dose Entered By: Carrie Cool [name], BSN, Carrie on 01/17/2016 10:25:34 [name], [name] ([id number]) -------------------------------------------------------------------------------- Lower Extremity Assessment Details Patient Name: [name], [name] Date of Service: 01/17/2016 9:15 AM Medical Record Patient Account Number: [id number] Number: Treating [name]: [name] Nov 07, 1934 (80 y.o. Other Clinician: Date of Birth/Sex: Female) Treating [name] Primary Care Physician/Extender: [name] Physician: Referring Physician: Melina [name] in Treatment: 8 Edema Assessment Assessed: [Left: No] [Right: No] E[Left: dema] [Right: :] Calf Left: Right: Point of Measurement: cm From Medial Instep 37.5 cm 35 cm Ankle Left: Right: Point of Measurement: cm From Medial Instep 21.5 cm 21.5 cm Vascular Assessment Claudication: Claudication Assessment [Left:None] [Right:None] Pulses: Posterior Tibial Dorsalis Pedis Palpable: [Left:Yes] [Right:Yes] Extremity colors, hair growth, and conditions: Hair Growth on Extremity: [Left:No] [Right:No] Temperature of Extremity: [Left:Warm] [Right:Warm] Capillary Refill: [Left:< 3 seconds] [Right:< 3 seconds] Dependent Rubor: [Left:No] [Right:No] Blanched when Elevated: [Left:No] [Right:No] Lipodermatosclerosis: [Left:No] [Right:No] Toe Nail Assessment Left: Right: Thick: Yes Yes Discolored: Yes Yes Deformed: No Improper Length and Hygiene: No No ZYONA, MURLEY ([id number]) Electronic Signature(s) Signed: 01/18/2016 8:04:32 AM By: Carrie Cool, [name], BSN, [name], BSN Entered By: Carrie Cool, [name], BSN, Carrie on 01/17/2016 09:41:52 [name], [name]  ([id number]) -------------------------------------------------------------------------------- Multi Wound Chart Details Patient Name: Carrie Mulligan C. Date of Service: 01/17/2016 9:15 AM Medical Record Patient Account Number: [id number] Number: Treating [name]: [name] 09/26/1934 (80 y.o. Other Clinician: Date of Birth/Sex: Female) Treating [name] Primary Care Physician/Extender: [name] Physician: Referring Physician: Melina [name] in Treatment: 8 Vital Signs Height(in): 68 Pulse(bpm): 45 Weight(lbs): 178 Blood Pressure 139/55 (mmHg): Body Mass Index(BMI): 27 Temperature(F): 98.1 Respiratory Rate 20 (breaths/min): Photos: Wound Location: Left Lower Leg - Medial Right Lower Leg - Lateral Left Lower Leg - Anterior, Proximal Wounding Event: Gradually Appeared Gradually Appeared Trauma Primary Etiology: Lymphedema Lymphedema Trauma, Other Comorbid History: Cataracts, Arrhythmia, Cataracts, Arrhythmia, Cataracts, Arrhythmia, Hypotension, Hypotension, Hypotension, Osteoarthritis Osteoarthritis Osteoarthritis Date Acquired: 07/04/2015 07/04/2015 12/18/2015 Weeks of Treatment: 8 8 4  Wound Status: Open Open Open Measurements L x  W x D 0.7x0.5x0.1 1.6x0.8x0.03 2.5x0.5x0.1 (cm) Area (cm) : 0.275 1.005 0.982 Volume (cm) : 0.027 0.03 0.098 % Reduction in Area: 99.40% 28.90% 67.10% % Reduction in Volume: 99.70% 78.70% 83.60% Classification: Partial Thickness Partial Thickness Partial Thickness Exudate Amount: None Present Small None Present Exudate Type: N/A Serosanguineous N/A Exudate Color: N/A red, brown N/A [name], [name] ([id number]) Wound Margin: Distinct, outline attached Distinct, outline attached Flat and Intact Granulation Amount: Medium (34-66%) Medium (34-66%) Medium (34-66%) Granulation Quality: Red Pink Red, Pink Necrotic Amount: Medium (34-66%) Medium (34-66%) Medium (34-66%) Necrotic Tissue: Eschar Adherent Slough  Eschar Exposed Structures: Fascia: No Fascia: No Fascia: No Fat: No Fat: No Fat: No Tendon: No Tendon: No Tendon: No Muscle: No Muscle: No Muscle: No Joint: No Joint: No Joint: No Bone: No Bone: No Bone: No Limited to Skin Limited to Skin Limited to Skin Breakdown Breakdown Breakdown Epithelialization: None None None Periwound Skin Texture: Edema: No Edema: Yes Edema: Yes Excoriation: No Excoriation: No Induration: No Induration: No Callus: No Callus: No Crepitus: No Crepitus: No Fluctuance: No Fluctuance: No Friable: No Friable: No Rash: No Rash: No Scarring: No Scarring: No Periwound Skin Moist: Yes Moist: Yes Moist: No Moisture: Maceration: No Maceration: No Dry/Scaly: No Dry/Scaly: No Periwound Skin Color: Atrophie Blanche: No Atrophie Blanche: No No Abnormalities Noted Cyanosis: No Cyanosis: No Ecchymosis: No Ecchymosis: No Erythema: No Erythema: No Hemosiderin Staining: No Hemosiderin Staining: No Mottled: No Mottled: No Pallor: No Pallor: No Rubor: No Rubor: No Temperature: No Abnormality No Abnormality No Abnormality Tenderness on Yes Yes Yes Palpation: Wound Preparation: Ulcer Cleansing: Ulcer Cleansing: Ulcer Cleansing: Rinsed/Irrigated with Rinsed/Irrigated with Rinsed/Irrigated with Saline Saline Saline Topical Anesthetic Topical Anesthetic Topical Anesthetic Applied: Other: lidocaine Applied: Other: lidocaine Applied: None, Other: 4% 4% lidocaine 4% Treatment Notes Electronic Signature(s) Signed: 01/18/2016 8:04:32 AM By: Carrie Cool, [name], BSN, [name], BSN [address] [place] ([id number]) Entered By: Carrie Cool, [name], BSN, Carrie on 01/17/2016 09:58:23 [name] ([id number]) -------------------------------------------------------------------------------- Multi-Disciplinary Care Plan Details Patient Name: [name], GULLAGE. Date of Service: 01/17/2016 9:15 AM Medical Record Patient Account Number:  [id number] Number: Treating [name]: [name] 01/03/1935 (80 y.o. Other Clinician: Date of Birth/Sex: Female) Treating [name] Primary Care Physician/Extender: [name] Physician: Referring Physician: Melina [name] in Treatment: 8 Active Inactive Orientation to the Wound Care Program Nursing Diagnoses: Knowledge deficit related to the wound healing center program Goals: Patient/caregiver will verbalize understanding of the [facility] Date Initiated: 11/22/2015 Goal Status: Active Interventions: Provide education on orientation to the wound center Notes: Venous Leg Ulcer Nursing Diagnoses: Knowledge deficit related to disease process and management Potential for venous Insuffiency (use before diagnosis confirmed) Goals: Patient will maintain optimal edema control Date Initiated: 11/22/2015 Goal Status: Active Patient/caregiver will verbalize understanding of disease process and disease management Date Initiated: 11/22/2015 Goal Status: Active Verify adequate tissue perfusion prior to therapeutic compression application Date Initiated: 11/22/2015 Goal Status: Active Interventions: Assess peripheral edema status every visit. [name], LENIUS ([id number]) Compression as ordered Provide education on venous insufficiency Treatment Activities: Non-invasive vascular studies : 11/22/2015 Therapeutic compression applied : 11/22/2015 Notes: Wound/Skin Impairment Nursing Diagnoses: Impaired tissue integrity Knowledge deficit related to ulceration/compromised skin integrity Goals: Patient/caregiver will verbalize understanding of skin care regimen Date Initiated: 11/22/2015 Goal Status: Active Ulcer/skin breakdown will have a volume reduction of 30% by week 4 Date Initiated: 11/22/2015 Goal Status: Active Ulcer/skin breakdown will have a volume reduction of 50% by week 8 Date Initiated: 11/22/2015 Goal Status:  Active Ulcer/skin  breakdown will have a volume reduction of 80% by week 12 Date Initiated: 11/22/2015 Goal Status: Active Ulcer/skin breakdown will heal within 14 weeks Date Initiated: 11/22/2015 Goal Status: Active Interventions: Assess patient/caregiver ability to perform ulcer/skin care regimen upon admission and as needed Assess ulceration(s) every visit Provide education on ulcer and skin care Treatment Activities: Skin care regimen initiated : 11/22/2015 Topical wound management initiated : 11/22/2015 Notes: Electronic Signature(s) Signed: 01/18/2016 8:04:32 AM By: Carrie Cool, [name], BSN, [name], BSN [address] [place] ([id number]) Entered By: Carrie Cool, [name], BSN, Carrie on 01/17/2016 09:43:33 [name], [name] ([id number]) -------------------------------------------------------------------------------- Pain Assessment Details Patient Name: Carrie Mulligan C. Date of Service: 01/17/2016 9:15 AM Medical Record Patient Account Number: [id number] Number: Treating [name]: [name] 1935-02-12 (80 y.o. Other Clinician: Date of Birth/Sex: Female) Treating [name] Primary Care Physician/Extender: [name] Physician: Referring Physician: Melina [name] in Treatment: 8 Active Problems Location of Pain Severity and Description of Pain Patient Has Paino Yes Site Locations Pain Location: Pain in Ulcers With Dressing Change: Yes Duration of the Pain. Constant / Intermittento Intermittent Rate the pain. Current Pain Level: 8 Pain Management and Medication Current Pain Management: Medication: Yes Electronic Signature(s) Signed: 01/18/2016 8:04:32 AM By: Carrie Cool, [name], BSN, [name], BSN Entered By: Carrie Cool, [name], BSN, Carrie on 01/17/2016 [id number] [name] ([id number]) -------------------------------------------------------------------------------- Patient/Caregiver Education Details Patient Name: [name]. Date of Service: 01/17/2016 9:15 AM Medical Record Patient Account  Number: [id number] Number: Treating [name]: [name] 06-13-1935 (80 y.o. Other Clinician: Date of Birth/Gender: Female) Treating [name] Primary Care Physician/Extender: [name] Physician: Suella [place] in Treatment: 8 Referring Physician: [name] Education Assessment Education Provided To: Patient and Caregiver Education Topics Provided Pain: Methods: Explain/Verbal Responses: State content correctly [employer]) Signed: 01/18/2016 8:04:32 AM By: Carrie Cool, [name], BSN, [name], BSN Entered By: Carrie Cool, [name], BSN, Carrie on 01/17/2016 10:23:59 [name], [name] ([id number]) -------------------------------------------------------------------------------- Wound Assessment Details Patient Name: ELEAN, KARRAS C. Date of Service: 01/17/2016 9:15 AM Medical Record Patient Account Number: [id number] Number: Treating [name]: [name] [birth year] (80 y.o. Other Clinician: Date of Birth/Sex: Female) Treating [name] Primary Care Physician/Extender: [name] Physician: Referring Physician: Melina [name] in Treatment: 8 Wound Status Wound Number: 1 Primary Lymphedema Etiology: Wound Location: Left Lower Leg - Medial Wound Status: Open Wounding Event: Gradually Appeared Comorbid Cataracts, Arrhythmia, Hypotension, Date Acquired: 07/04/2015 History: Osteoarthritis Weeks Of Treatment: 8 Clustered Wound: No Photos Wound Measurements Length: (cm) 0.7 % Reduction i Width: (cm) 0.5 % Reduction i Depth: (cm) 0.1 Epithelializa Area: (cm) 0.275 Tunneling: Volume: (cm) 0.027 Undermining: n Area: 99.4% n Volume: 99.7% tion: None No No Wound Description Classification: Partial Thickness Wound Margin: Distinct, outline attached Exudate Amount: None Present Foul Odor After Cleansing: No Wound Bed Granulation Amount: Medium (34-66%) Exposed Structure Granulation Quality: Red Fascia Exposed: No Necrotic Amount: Medium  (34-66%) Fat Layer Exposed: No Necrotic Quality: Eschar Tendon Exposed: No Muscle Exposed: No Ing, Adiba C. ([id number]) Joint Exposed: No Bone Exposed: No Limited to Skin Breakdown Periwound Skin Texture Texture Color No Abnormalities Noted: No No Abnormalities Noted: No Callus: No Atrophie Blanche: No Crepitus: No Cyanosis: No Excoriation: No Ecchymosis: No Fluctuance: No Erythema: No Friable: No Hemosiderin Staining: No Induration: No Mottled: No Localized Edema: No Pallor: No Rash: No Rubor: No Scarring: No Temperature / Pain Moisture Temperature: No Abnormality No Abnormalities Noted: No Tenderness on Palpation: Yes Dry / Scaly: No Maceration: No Moist: Yes Wound Preparation  Ulcer Cleansing: Rinsed/Irrigated with Saline Topical Anesthetic Applied: Other: lidocaine 4%, Treatment Notes Wound #1 (Left, Medial Lower Leg) 1. Cleansed with: Clean wound with Normal Saline May Shower, gently pat wound dry prior to applying new dressing. May shower with protection 2. Anesthetic Liquid 4% Topical Lidocaine Solution 4. Dressing Applied: Promogran 5. Secondary Dressing Applied Dry Gauze 7. Secured with 3 Layer Compression System - Right Lower Extremity 4-Layer Compression System - Left Lower Extremity Electronic Signature(s) Signed: 01/18/2016 8:04:32 AM By: Carrie Cool, [name], BSN, [name], BSN Entered By: Carrie Cool, [name], BSN, Carrie on 01/17/2016 09:56:10 Johnston, [name] ([id number]) AVO, VOCI ([id number]) -------------------------------------------------------------------------------- Wound Assessment Details Patient Name: SHELLBY, MELTZER C. Date of Service: 01/17/2016 9:15 AM Medical Record Patient Account Number: [id number] Number: Treating [name]: [name] Nov 18, 1934 (80 y.o. Other Clinician: Date of Birth/Sex: Female) Treating [name] Primary Care Physician/Extender: [name] Physician: Referring Physician: Melina [name] in Treatment: 8 Wound Status Wound Number: 2 Primary Lymphedema Etiology: Wound Location: Right Lower Leg - Lateral Wound Status: Open Wounding Event: Gradually Appeared Comorbid Cataracts, Arrhythmia, Hypotension, Date Acquired: 07/04/2015 History: Osteoarthritis Weeks Of Treatment: 8 Clustered Wound: No Photos Wound Measurements Length: (cm) 1.6 % Reduction i Width: (cm) 0.8 % Reduction i Depth: (cm) 0.03 Epithelializa Area: (cm) 1.005 Tunneling: Volume: (cm) 0.03 Undermining: n Area: 28.9% n Volume: 78.7% tion: None No No Wound Description Classification: Partial Thickness Wound Margin: Distinct, outline attached Exudate Amount: Small Exudate Type: Serosanguineous Exudate Color: red, brown Foul Odor After Cleansing: No Wound Bed Granulation Amount: Medium (34-66%) Exposed Structure Granulation Quality: Pink Fascia Exposed: No Necrotic Amount: Medium (34-66%) Fat Layer Exposed: No Christoph, Shavawn C. ([id number]) Necrotic Quality: Adherent Slough Tendon Exposed: No Muscle Exposed: No Joint Exposed: No Bone Exposed: No Limited to Skin Breakdown Periwound Skin Texture Texture Color No Abnormalities Noted: No No Abnormalities Noted: No Callus: No Atrophie Blanche: No Crepitus: No Cyanosis: No Excoriation: No Ecchymosis: No Fluctuance: No Erythema: No Friable: No Hemosiderin Staining: No Induration: No Mottled: No Localized Edema: Yes Pallor: No Rash: No Rubor: No Scarring: No Temperature / Pain Moisture Temperature: No Abnormality No Abnormalities Noted: No Tenderness on Palpation: Yes Dry / Scaly: No Maceration: No Moist: Yes Wound Preparation Ulcer Cleansing: Rinsed/Irrigated with Saline Topical Anesthetic Applied: Other: lidocaine 4%, Treatment Notes Wound #2 (Right, Lateral Lower Leg) 1. Cleansed with: Clean wound with Normal Saline May Shower, gently pat wound dry prior to applying new dressing. May shower with  protection 2. Anesthetic Liquid 4% Topical Lidocaine Solution 4. Dressing Applied: Promogran 5. Secondary Dressing Applied Dry Gauze 7. Secured with 3 Layer Compression System - Right Lower Extremity 4-Layer Compression System - Left Lower Extremity Electronic Signature(s) Signed: 01/18/2016 8:04:32 AM By: Carrie Cool, [name], BSN, [name], BSN [address] [place] ([id number]) Entered By: Carrie Cool, [name], BSN, Carrie on 01/17/2016 09:56:33 [place], [name] ([id number]) -------------------------------------------------------------------------------- Wound Assessment Details Patient Name: ALICA, LOSEKE C. Date of Service: 01/17/2016 9:15 AM Medical Record Patient Account Number: [id number] Number: Treating [name]: [name] 05-14-1935 (80 y.o. Other Clinician: Date of Birth/Sex: Female) Treating [name] Primary Care Physician/Extender: [name] Physician: Referring Physician: Melina [name] in Treatment: 8 Wound Status Wound Number: 3 Primary Trauma, Other Etiology: Wound Location: Left Lower Leg - Anterior, Proximal Wound Status: Open Wounding Event: Trauma Comorbid Cataracts, Arrhythmia, Hypotension, History: Osteoarthritis Date Acquired: 12/18/2015 Weeks Of Treatment: 4 Clustered Wound: No Photos Wound Measurements Length: (cm) 2.5 % Reduction in A Width: (cm) 0.5 % Reduction in  V Depth: (cm) 0.1 Epithelializatio Area: (cm) 0.982 Tunneling: Volume: (cm) 0.098 Undermining: rea: 67.1% olume: 83.6% n: None No No Wound Description Classification: Partial Thickness Foul Odor After Wound Margin: Flat and Intact Exudate Amount: None Present Cleansing: No Wound Bed Granulation Amount: Medium (34-66%) Exposed Structure Granulation Quality: Red, Pink Fascia Exposed: No Necrotic Amount: Medium (34-66%) Fat Layer Exposed: No Necrotic Quality: Eschar Tendon Exposed: No Samano, Jammy C. ([id number]) Muscle Exposed: No Joint Exposed: No Bone  Exposed: No Limited to Skin Breakdown Periwound Skin Texture Texture Color No Abnormalities Noted: No No Abnormalities Noted: No Localized Edema: Yes Temperature / Pain Moisture Temperature: No Abnormality No Abnormalities Noted: No Tenderness on Palpation: Yes Moist: No Wound Preparation Ulcer Cleansing: Rinsed/Irrigated with Saline Topical Anesthetic Applied: None, Other: lidocaine 4%, Treatment Notes Wound #3 (Left, Proximal, Anterior Lower Leg) 1. Cleansed with: Clean wound with Normal Saline May Shower, gently pat wound dry prior to applying new dressing. May shower with protection 2. Anesthetic Liquid 4% Topical Lidocaine Solution 4. Dressing Applied: Promogran 5. Secondary Dressing Applied Dry Gauze 7. Secured with 3 Layer Compression System - Right Lower Extremity 4-Layer Compression System - Left Lower Extremity Electronic Signature(s) Signed: 01/18/2016 8:04:32 AM By: Carrie Cool, [name], BSN, [name], BSN Entered By: Carrie Cool, [name], BSN, Carrie on 01/17/2016 09:56:57 [place], [name] ([id number]) -------------------------------------------------------------------------------- Vitals Details Patient Name: Carrie Mulligan C. Date of Service: 01/17/2016 9:15 AM Medical Record Patient Account Number: [id number] Number: Treating [name]: [name] Sep 18, 1934 (80 y.o. Other Clinician: Date of Birth/Sex: Female) Treating [name] Primary Care Physician/Extender: [name] Physician: Referring Physician: Melina [name] in Treatment: 8 Vital Signs Time Taken: 09:28 Temperature (F): 98.1 Height (in): 68 Pulse (bpm): 45 Weight (lbs): 178 Respiratory Rate (breaths/min): 20 Body Mass Index (BMI): 27.1 Blood Pressure (mmHg): 139/55 Reference Range: 80 - 120 mg / dl Electronic Signature(s) Signed: 01/18/2016 8:04:32 AM By: Carrie Cool, [name], BSN, [name], BSN Entered By: Carrie Cool, [name], BSN, Carrie on 01/17/2016 09:26:01

## 2016-01-22 ENCOUNTER — Ambulatory Visit: Payer: Medicare Other

## 2016-01-22 ENCOUNTER — Encounter: Payer: Medicare Other | Admitting: *Deleted

## 2016-01-22 DIAGNOSIS — I5042 Chronic combined systolic (congestive) and diastolic (congestive) heart failure: Secondary | ICD-10-CM

## 2016-01-22 DIAGNOSIS — Z9889 Other specified postprocedural states: Secondary | ICD-10-CM

## 2016-01-22 DIAGNOSIS — Z952 Presence of prosthetic heart valve: Secondary | ICD-10-CM

## 2016-01-22 NOTE — Progress Notes (Signed)
Daily Session Note  Patient Details  Name: [name] MRN: [id number] Date of Birth: 1934-11-26 Referring Provider:    Encounter Date: 01/22/2016  Check In:     Session Check In - 01/22/16 1645      Check-In   Location [facility]   Staff Present Gerlene Burdock, RN, Moises Blood, BS, ACSM CEP, Exercise Physiologist;Mary Kellie Shropshire, RN, BSN, MA   Supervising physician immediately available to respond to emergencies See telemetry face sheet for immediately available ER MD   Medication changes reported     No   Fall or balance concerns reported    No   Warm-up and Cool-down Performed on first and last piece of equipment   Resistance Training Performed Yes   VAD Patient? No     Pain Assessment   Currently in Pain? Yes   Pain Score 8    Pain Location Hip   Pain Orientation Right   Pain Descriptors / Indicators Shooting   Pain Type Acute pain   Pain Onset 1 to 4 weeks ago   Pain Frequency Intermittent   Aggravating Factors  Pain is only when standing.   Pain Relieving Factors Sitting   Multiple Pain Sites No         Goals Met:  Independence with exercise equipment Exercise tolerated well No report of cardiac concerns or symptoms Strength training completed today  Goals Unmet:  Not Applicable  Comments: Patient tolerated exercise well. She avoided the TM due to her hip pain but was able to tolerate the NS machine.    Dr. [name] is Medical Director for [facility] and [facility].

## 2016-01-24 ENCOUNTER — Ambulatory Visit: Payer: Medicare Other

## 2016-01-24 ENCOUNTER — Encounter: Payer: Medicare Other | Admitting: Internal Medicine

## 2016-01-24 DIAGNOSIS — I5042 Chronic combined systolic (congestive) and diastolic (congestive) heart failure: Secondary | ICD-10-CM | POA: Diagnosis not present

## 2016-01-25 ENCOUNTER — Ambulatory Visit: Payer: Medicare Other

## 2016-01-25 ENCOUNTER — Encounter: Payer: Medicare Other | Admitting: *Deleted

## 2016-01-25 VITALS — BP 142/70 | HR 56

## 2016-01-25 DIAGNOSIS — I5042 Chronic combined systolic (congestive) and diastolic (congestive) heart failure: Secondary | ICD-10-CM | POA: Diagnosis not present

## 2016-01-25 DIAGNOSIS — Z952 Presence of prosthetic heart valve: Secondary | ICD-10-CM

## 2016-01-25 NOTE — Progress Notes (Signed)
MOSE, SELVIDGE ([id number]) Visit Report for 01/24/2016 Arrival Information Details Patient Name: Carrie, [name]. Date of Service: 01/24/2016 2:15 PM Medical Record Patient Account Number: [id number] Number: Treating RN: Montey Hora 11-16-1934 (80 y.o. Other Clinician: Date of Birth/Sex: Female) Treating [name], [name] Primary Care Physician/Extender: Claudette Laws Physician: Referring Physician: [name] in Treatment: 9 Visit Information History Since Last Visit Added or deleted any medications: No Patient Arrived: Cane Any new allergies or adverse reactions: No Arrival Time: 14:54 Had a fall or experienced change in No Accompanied By: son activities of daily living that may affect Transfer Assistance: None risk of falls: Patient Identification Verified: Yes Signs or symptoms of abuse/neglect since last No Secondary Verification Process Yes visito Completed: Hospitalized since last visit: No Patient Requires Transmission- No Pain Present Now: No Based Precautions: Patient Has Alerts: Yes Patient Alerts: Patient on Blood Thinner Bland Signature(s) Signed: 01/24/2016 4:31:46 PM By: Montey Hora Entered By: Montey Hora on 01/24/2016 14:54:48 [name], [name] ([id number]) -------------------------------------------------------------------------------- Encounter Discharge Information Details Patient Name: Carrie Mulligan C. Date of Service: 01/24/2016 2:15 PM Medical Record Patient Account Number: [id number] Number: Treating RN: Montey Hora 06/09/1935 (80 y.o. Other Clinician: Date of Birth/Sex: Female) Treating [name], [name] Primary Care Physician/Extender: Claudette Laws Physician: Referring Physician: [name] in Treatment: 9 Encounter Discharge Information Items Discharge Pain Level: 0 Discharge Condition: Stable Ambulatory Status: Cane Discharge  Destination: Home Transportation: Private Auto Accompanied By: son Schedule Follow-up Appointment: Yes Medication Reconciliation completed No and provided to Patient/Care [name]: Provided on Clinical Summary of Care: 01/24/2016 Form Type Recipient Paper Patient MS Electronic Signature(s) Signed: 01/24/2016 4:04:05 PM By: Montey Hora Previous Signature: 01/24/2016 3:54:15 PM Version By: Ruthine Dose Entered By: Montey Hora on 01/24/2016 16:04:05 [name] ([id number]) -------------------------------------------------------------------------------- Lower Extremity Assessment Details Patient Name: [name], [name] Date of Service: 01/24/2016 2:15 PM Medical Record Patient Account Number: [id number] Number: Treating RN: Montey Hora 01/20/1935 (80 y.o. Other Clinician: Date of Birth/Sex: Female) Treating [name], [name] Primary Care Physician/Extender: Claudette Laws Physician: Referring Physician: [name] in Treatment: 9 Edema Assessment Assessed: [Left: No] [Right: No] Edema: [Left: Yes] [Right: Yes] Calf Left: Right: Point of Measurement: 36 cm From Medial Instep 44.1 cm 37.5 cm Ankle Left: Right: Point of Measurement: 10 cm From Medial Instep 24.3 cm 27.1 cm Vascular Assessment Pulses: Posterior Tibial Dorsalis Pedis Palpable: [Left:Yes] [Right:Yes] Extremity colors, hair growth, and conditions: Extremity Color: [Left:Hyperpigmented] [Right:Hyperpigmented] Hair Growth on Extremity: [Left:No] [Right:No] Temperature of Extremity: [Left:Warm] [Right:Warm] Capillary Refill: [Left:< 3 seconds] [Right:< 3 seconds] Electronic Signature(s) Signed: 01/24/2016 4:31:46 PM By: Montey Hora Entered By: Montey Hora on 01/24/2016 15:04:25 [name] ([id number]) -------------------------------------------------------------------------------- Multi Wound Chart Details Patient Name: Carrie Mulligan C. Date of Service:  01/24/2016 2:15 PM Medical Record Patient Account Number: [id number] Number: Treating RN: Montey Hora 1934/10/28 (80 y.o. Other Clinician: Date of Birth/Sex: Female) Treating [name], [name] Primary Care Physician/Extender: Claudette Laws Physician: Referring Physician: [name] in Treatment: 9 Vital Signs Height(in): 68 Pulse(bpm): 48 Weight(lbs): 178 Blood Pressure 125/47 (mmHg): Body Mass Index(BMI): 27 Temperature(F): 98.0 Respiratory Rate 18 (breaths/min): Photos: Wound Location: Left Lower Leg - Medial Right Lower Leg - Lateral Left Lower Leg - Anterior, Proximal Wounding Event: Gradually Appeared Gradually Appeared Trauma Primary Etiology: Lymphedema Lymphedema Trauma, Other Comorbid History: Cataracts, Arrhythmia, Cataracts, Arrhythmia, Cataracts, Arrhythmia, Hypotension, Hypotension, Hypotension, Osteoarthritis Osteoarthritis Osteoarthritis Date Acquired: 07/04/2015 07/04/2015 12/18/2015 Weeks of Treatment: 9 9  5 Wound Status: Open Open Open Measurements L x W x D 0.6x0.3x0.1 1.5x1x0.3 0.5x0.1x0.1 (cm) Area (cm) : 0.141 1.178 0.039 Volume (cm) : 0.014 0.353 0.004 % Reduction in Area: 99.70% 16.70% 98.70% % Reduction in Volume: 99.80% -150.40% 99.30% Classification: Partial Thickness Partial Thickness Partial Thickness Exudate Amount: None Present Small None Present Exudate Type: N/A Serosanguineous N/A Exudate Color: N/A red, brown N/A [name], [name] ([id number]) Wound Margin: Distinct, outline attached Distinct, outline attached Flat and Intact Granulation Amount: Medium (34-66%) Medium (34-66%) None Present (0%) Granulation Quality: Red Pink N/A Necrotic Amount: Medium (34-66%) Medium (34-66%) Large (67-100%) Necrotic Tissue: Eschar Adherent Slough Eschar Exposed Structures: Fascia: No Fascia: No Fascia: No Fat: No Fat: No Fat: No Tendon: No Tendon: No Tendon: No Muscle: No Muscle: No Muscle: No Joint: No Joint:  No Joint: No Bone: No Bone: No Bone: No Limited to Skin Limited to Skin Limited to Skin Breakdown Breakdown Breakdown Epithelialization: None None None Periwound Skin Texture: Edema: No Edema: Yes Edema: Yes Excoriation: No Excoriation: No Induration: No Induration: No Callus: No Callus: No Crepitus: No Crepitus: No Fluctuance: No Fluctuance: No Friable: No Friable: No Rash: No Rash: No Scarring: No Scarring: No Periwound Skin Moist: Yes Moist: Yes Moist: No Moisture: Maceration: No Maceration: No Dry/Scaly: No Dry/Scaly: No Periwound Skin Color: Atrophie Blanche: No Atrophie Blanche: No No Abnormalities Noted Cyanosis: No Cyanosis: No Ecchymosis: No Ecchymosis: No Erythema: No Erythema: No Hemosiderin Staining: No Hemosiderin Staining: No Mottled: No Mottled: No Pallor: No Pallor: No Rubor: No Rubor: No Temperature: No Abnormality No Abnormality No Abnormality Tenderness on Yes Yes Yes Palpation: Wound Preparation: Ulcer Cleansing: Ulcer Cleansing: Ulcer Cleansing: Rinsed/Irrigated with Rinsed/Irrigated with Rinsed/Irrigated with Saline Saline Saline Topical Anesthetic Topical Anesthetic Topical Anesthetic Applied: Other: lidocaine Applied: Other: lidocaine Applied: None, Other: 4% 4% lidocaine 4% Treatment Notes Electronic Signature(s) Signed: 01/24/2016 4:31:46 PM By: Gerrit Friends, [name] ([id number]) Entered By: Montey Hora on 01/24/2016 15:19:26 Mariner, [name] ([id number]) -------------------------------------------------------------------------------- Multi-Disciplinary Care Plan Details Patient Name: MAJEL, MELO C. Date of Service: 01/24/2016 2:15 PM Medical Record Patient Account Number: [id number] Number: Treating RN: Montey Hora [birth year] (80 y.o. Other Clinician: Date of Birth/Sex: Female) Treating [name], [name] Primary Care Physician/Extender: Claudette Laws Physician: Referring  Physician: [name] in Treatment: 9 Active Inactive Orientation to the Wound Care Program Nursing Diagnoses: Knowledge deficit related to the wound healing center program Goals: Patient/caregiver will verbalize understanding of the [facility] Program Date Initiated: 11/22/2015 Goal Status: Active Interventions: Provide education on orientation to the wound center Notes: Venous Leg Ulcer Nursing Diagnoses: Knowledge deficit related to disease process and management Potential for venous Insuffiency (use before diagnosis confirmed) Goals: Patient will maintain optimal edema control Date Initiated: 11/22/2015 Goal Status: Active Patient/caregiver will verbalize understanding of disease process and disease management Date Initiated: 11/22/2015 Goal Status: Active Verify adequate tissue perfusion prior to therapeutic compression application Date Initiated: 11/22/2015 Goal Status: Active Interventions: Assess peripheral edema status every visit. LAUNA, WINKELMAN ([id number]) Compression as ordered Provide education on venous insufficiency Treatment Activities: Non-invasive vascular studies : 11/22/2015 Therapeutic compression applied : 11/22/2015 Notes: Wound/Skin Impairment Nursing Diagnoses: Impaired tissue integrity Knowledge deficit related to ulceration/compromised skin integrity Goals: Patient/caregiver will verbalize understanding of skin care regimen Date Initiated: 11/22/2015 Goal Status: Active Ulcer/skin breakdown will have a volume reduction of 30% by week 4 Date Initiated: 11/22/2015 Goal Status: Active Ulcer/skin breakdown will have a volume reduction of 50% by week 8 Date Initiated:  11/22/2015 Goal Status: Active Ulcer/skin breakdown will have a volume reduction of 80% by week 12 Date Initiated: 11/22/2015 Goal Status: Active Ulcer/skin breakdown will heal within 14 weeks Date Initiated: 11/22/2015 Goal Status: Active Interventions: Assess  patient/caregiver ability to perform ulcer/skin care regimen upon admission and as needed Assess ulceration(s) every visit Provide education on ulcer and skin care Treatment Activities: Skin care regimen initiated : 11/22/2015 Topical wound management initiated : 11/22/2015 Notes: Electronic Signature(s) Signed: 01/24/2016 4:31:46 PM By: Gerrit Friends, [name] ([id number]) Entered By: Montey Hora on 01/24/2016 15:08:48 Kassebaum, [name] ([id number]) -------------------------------------------------------------------------------- Pain Assessment Details Patient Name: Carrie Mulligan C. Date of Service: 01/24/2016 2:15 PM Medical Record Patient Account Number: [id number] Number: Treating RN: Montey Hora Oct 31, 1934 (80 y.o. Other Clinician: Date of Birth/Sex: Female) Treating [name], [name] Primary Care Physician/Extender: Claudette Laws Physician: Referring Physician: [name] in Treatment: 9 Active Problems Location of Pain Severity and Description of Pain Patient Has Paino No Site Locations Pain Management and Medication Current Pain Management: Notes Topical or injectable lidocaine is offered to patient for acute pain when surgical debridement is performed. If needed, Patient is instructed to use over the counter pain medication for the following 24-48 hours after debridement. Wound care MDs do not prescribed pain medications. Patient has chronic pain or uncontrolled pain. Patient has been instructed to make an appointment with their Primary Care Physician for pain management. Electronic Signature(s) Signed: 01/24/2016 4:31:46 PM By: Montey Hora Entered By: Montey Hora on 01/24/2016 14:54:56 Volkert, [name] ([id number]) -------------------------------------------------------------------------------- Patient/Caregiver Education Details Patient Name: [name] Date of Service: 01/24/2016 2:15 PM Medical Record  Patient Account Number: [id number] Number: Treating RN: Montey Hora [birth year] (80 y.o. Other Clinician: Date of Birth/Gender: Female) Treating [name], [name] Primary Care Physician/Extender: Claudette Laws Physician: Suella Grove in Treatment: 9 Referring Physician: [name] Education Assessment Education Provided To: Patient and Caregiver Education Topics Provided Venous: Handouts: Other: nurse visit if needed if wrap slips Methods: Explain/Verbal Responses: State content correctly Electronic Signature(s) Signed: 01/24/2016 4:31:46 PM By: Montey Hora Entered By: Montey Hora on 01/24/2016 16:04:26 [name], [name] ([id number]) -------------------------------------------------------------------------------- Wound Assessment Details Patient Name: Carrie Mulligan C. Date of Service: 01/24/2016 2:15 PM Medical Record Patient Account Number: [id number] Number: Treating RN: Montey Hora 20-Dec-1934 (80 y.o. Other Clinician: Date of Birth/Sex: Female) Treating [name], [name] Primary Care Physician/Extender: Claudette Laws Physician: Referring Physician: [name] in Treatment: 9 Wound Status Wound Number: 1 Primary Lymphedema Etiology: Wound Location: Left Lower Leg - Medial Wound Status: Open Wounding Event: Gradually Appeared Comorbid Cataracts, Arrhythmia, Hypotension, Date Acquired: 07/04/2015 History: Osteoarthritis Weeks Of Treatment: 9 Clustered Wound: No Photos Wound Measurements Length: (cm) 0.6 % Reduction i Width: (cm) 0.3 % Reduction i Depth: (cm) 0.1 Epithelializa Area: (cm) 0.141 Tunneling: Volume: (cm) 0.014 Undermining: n Area: 99.7% n Volume: 99.8% tion: None No No Wound Description Classification: Partial Thickness Wound Margin: Distinct, outline attached Exudate Amount: None Present Foul Odor After Cleansing: No Wound Bed Granulation Amount: Medium (34-66%) Exposed Structure Granulation  Quality: Red Fascia Exposed: No Necrotic Amount: Medium (34-66%) Fat Layer Exposed: No Necrotic Quality: Eschar Tendon Exposed: No Muscle Exposed: No Belton, Hedaya C. ([id number]) Joint Exposed: No Bone Exposed: No Limited to Skin Breakdown Periwound Skin Texture Texture Color No Abnormalities Noted: No No Abnormalities Noted: No Callus: No Atrophie Blanche: No Crepitus: No Cyanosis: No Excoriation: No Ecchymosis: No Fluctuance: No Erythema: No Friable: No Hemosiderin Staining: No Induration: No Mottled: No Localized  Edema: No Pallor: No Rash: No Rubor: No Scarring: No Temperature / Pain Moisture Temperature: No Abnormality No Abnormalities Noted: No Tenderness on Palpation: Yes Dry / Scaly: No Maceration: No Moist: Yes Wound Preparation Ulcer Cleansing: Rinsed/Irrigated with Saline Topical Anesthetic Applied: Other: lidocaine 4%, Treatment Notes Wound #1 (Left, Medial Lower Leg) 1. Cleansed with: Clean wound with Normal Saline 2. Anesthetic Topical Lidocaine 4% cream to wound bed prior to debridement 4. Dressing Applied: Promogran 5. Secondary Dressing Applied ABD Pad 7. Secured with Tape 4 Layer Compression System - Bilateral Electronic Signature(s) Signed: 01/24/2016 4:31:46 PM By: Montey Hora Entered By: Montey Hora on 01/24/2016 15:07:45 Greener, [name] ([id number]) -------------------------------------------------------------------------------- Wound Assessment Details Patient Name: [name], [name] Date of Service: 01/24/2016 2:15 PM Medical Record Patient Account Number: [id number] Number: Treating RN: Montey Hora Mar 04, 1935 (80 y.o. Other Clinician: Date of Birth/Sex: Female) Treating [name], [name] Primary Care Physician/Extender: Claudette Laws Physician: Referring Physician: [name] in Treatment: 9 Wound Status Wound Number: 2 Primary Lymphedema Etiology: Wound Location: Right Lower Leg -  Lateral Wound Status: Open Wounding Event: Gradually Appeared Comorbid Cataracts, Arrhythmia, Hypotension, Date Acquired: 07/04/2015 History: Osteoarthritis Weeks Of Treatment: 9 Clustered Wound: No Photos Wound Measurements Length: (cm) 1.5 % Reduction i Width: (cm) 1 % Reduction i Depth: (cm) 0.3 Epithelializa Area: (cm) 1.178 Tunneling: Volume: (cm) 0.353 Undermining: n Area: 16.7% n Volume: -150.4% tion: None No No Wound Description Classification: Partial Thickness Wound Margin: Distinct, outline attached Exudate Amount: Small Exudate Type: Serosanguineous Exudate Color: red, brown Foul Odor After Cleansing: No Wound Bed Granulation Amount: Medium (34-66%) Exposed Structure Granulation Quality: Pink Fascia Exposed: No Necrotic Amount: Medium (34-66%) Fat Layer Exposed: No Savitz, Neziah C. ([id number]) Necrotic Quality: Adherent Slough Tendon Exposed: No Muscle Exposed: No Joint Exposed: No Bone Exposed: No Limited to Skin Breakdown Periwound Skin Texture Texture Color No Abnormalities Noted: No No Abnormalities Noted: No Callus: No Atrophie Blanche: No Crepitus: No Cyanosis: No Excoriation: No Ecchymosis: No Fluctuance: No Erythema: No Friable: No Hemosiderin Staining: No Induration: No Mottled: No Localized Edema: Yes Pallor: No Rash: No Rubor: No Scarring: No Temperature / Pain Moisture Temperature: No Abnormality No Abnormalities Noted: No Tenderness on Palpation: Yes Dry / Scaly: No Maceration: No Moist: Yes Wound Preparation Ulcer Cleansing: Rinsed/Irrigated with Saline Topical Anesthetic Applied: Other: lidocaine 4%, Treatment Notes Wound #2 (Right, Lateral Lower Leg) 1. Cleansed with: Clean wound with Normal Saline 2. Anesthetic Topical Lidocaine 4% cream to wound bed prior to debridement 4. Dressing Applied: Promogran 5. Secondary Dressing Applied ABD Pad 7. Secured with Tape 4 Layer Compression System -  Bilateral Electronic Signature(s) Signed: 01/24/2016 4:31:46 PM By: Montey Hora Entered By: Montey Hora on 01/24/2016 15:08:08 LUKA, BURN ([id number]) [name], [name] ([id number]) -------------------------------------------------------------------------------- Wound Assessment Details Patient Name: [name], [name] Date of Service: 01/24/2016 2:15 PM Medical Record Patient Account Number: [id number] Number: Treating RN: Montey Hora 08-28-1934 (80 y.o. Other Clinician: Date of Birth/Sex: Female) Treating [name], [name] Primary Care Physician/Extender: Claudette Laws Physician: Referring Physician: [name] in Treatment: 9 Wound Status Wound Number: 3 Primary Trauma, Other Etiology: Wound Location: Left Lower Leg - Anterior, Proximal Wound Status: Open Wounding Event: Trauma Comorbid Cataracts, Arrhythmia, Hypotension, History: Osteoarthritis Date Acquired: 12/18/2015 Weeks Of Treatment: 5 Clustered Wound: No Photos Wound Measurements Length: (cm) 0.5 % Reduction in A Width: (cm) 0.1 % Reduction in V Depth: (cm) 0.1 Epithelializatio Area: (cm) 0.039 Tunneling: Volume: (cm) 0.004 Undermining: rea: 98.7% olume: 99.3%  n: None No No Wound Description Classification: Partial Thickness Foul Odor After Wound Margin: Flat and Intact Exudate Amount: None Present Cleansing: No Wound Bed Granulation Amount: None Present (0%) Exposed Structure Necrotic Amount: Large (67-100%) Fascia Exposed: No Necrotic Quality: Eschar Fat Layer Exposed: No Tendon Exposed: No Miyasato, [name] ([id number]) Muscle Exposed: No Joint Exposed: No Bone Exposed: No Limited to Skin Breakdown Periwound Skin Texture Texture Color No Abnormalities Noted: No No Abnormalities Noted: No Localized Edema: Yes Temperature / Pain Moisture Temperature: No Abnormality No Abnormalities Noted: No Tenderness on Palpation: Yes Moist: No Wound  Preparation Ulcer Cleansing: Rinsed/Irrigated with Saline Topical Anesthetic Applied: None, Other: lidocaine 4%, Treatment Notes Wound #3 (Left, Proximal, Anterior Lower Leg) 1. Cleansed with: Clean wound with Normal Saline 2. Anesthetic Topical Lidocaine 4% cream to wound bed prior to debridement 4. Dressing Applied: Promogran 5. Secondary Dressing Applied ABD Pad 7. Secured with Tape 4 Layer Compression System - Bilateral Electronic Signature(s) Signed: 01/24/2016 4:31:46 PM By: Montey Hora Entered By: Montey Hora on 01/24/2016 15:08:32 Rouse, [name] ([id number]) -------------------------------------------------------------------------------- Vitals Details Patient Name: Carrie Mulligan C. Date of Service: 01/24/2016 2:15 PM Medical Record Patient Account Number: [id number] Number: Treating RN: Montey Hora Mar 10, 1935 (80 y.o. Other Clinician: Date of Birth/Sex: Female) Treating [name], [name] Primary Care Physician/Extender: Claudette Laws Physician: Referring Physician: [name] in Treatment: 9 Vital Signs Time Taken: 14:55 Temperature (F): 98.0 Height (in): 68 Pulse (bpm): 48 Weight (lbs): 178 Respiratory Rate (breaths/min): 18 Body Mass Index (BMI): 27.1 Blood Pressure (mmHg): 125/47 Reference Range: 80 - 120 mg / dl Electronic Signature(s) Signed: 01/24/2016 4:31:46 PM By: Montey Hora Entered By: Montey Hora on 01/24/2016 14:56:10

## 2016-01-25 NOTE — Progress Notes (Signed)
SUZEN, HETZER ([id number]) Visit Report for 01/24/2016 Chief Complaint Document Details TASHUNA, HODUM Date of Service: 01/24/2016 2:15 PM Patient Name: C. Patient Account Number: [id number] Medical Record Treating RN: Montey Hora [id number] Number: Other Clinician: March 09, 1935 (81 y.o. Treating [name], [name] Date of Birth/Sex: Female) Physician/Extender: G Primary Care [name] Physician: Referring Physician: [name] in Treatment: 9 Information Obtained from: Patient Chief Complaint Patient is here for review of bilateral lower extremity wounds dating back to December 2016 Electronic Signature(s) Signed: 01/24/2016 3:57:44 PM By: [name] MD Entered By: [name] on 01/24/2016 15:41:44 Wenzl, [name] ([id number]) -------------------------------------------------------------------------------- Debridement Details [name] Date of Service: 01/24/2016 2:15 PM Patient Name: C. Patient Account Number: [id number] Medical Record Treating RN: Montey Hora [id number] Number: Other Clinician: 1935/05/16 (81 y.o. Treating [name], [place] Date of Birth/Sex: Female) Physician/Extender: G Primary Care [name] Physician: Referring Physician: [name] in Treatment: 9 Debridement Performed for Wound #1 Left,Medial Lower Leg Assessment: Performed By: Physician [name], MD Debridement: Debridement Pre-procedure Yes Verification/Time Out Taken: Start Time: 15:21 Pain Control: Lidocaine 4% Topical Solution Level: Skin/Subcutaneous Tissue Total Area Debrided (L x 0.6 (cm) x 0.3 (cm) = 0.18 (cm) W): Tissue and other Viable, Non-Viable, Fibrin/Slough, Subcutaneous material debrided: Instrument: Curette Bleeding: Minimum Hemostasis Achieved: Pressure End Time: 15:23 Procedural Pain: 0 Post Procedural Pain: 0 Response to Treatment: Procedure was tolerated well Post Debridement Measurements of Total  Wound Length: (cm) 0.6 Width: (cm) 0.3 Depth: (cm) 0.1 Volume: (cm) 0.014 Post Procedure Diagnosis Same as Pre-procedure Electronic Signature(s) Signed: 01/24/2016 3:57:44 PM By: [name] MD Signed: 01/24/2016 4:31:46 PM By: Montey Hora Entered By: [name] on 01/24/2016 15:41:17 Holster, [name] ([id number]) Dripps, [name] ([id number]) -------------------------------------------------------------------------------- Debridement Details [name] Date of Service: 01/24/2016 2:15 PM Patient Name: C. Patient Account Number: [id number] Medical Record Treating RN: Montey Hora [id number] Number: Other Clinician: [birth year] (81 y.o. Treating [name], [name] Date of Birth/Sex: Female) Physician/Extender: G Primary Care [name] Physician: Referring Physician: [name] in Treatment: 9 Debridement Performed for Wound #2 Right,Lateral Lower Leg Assessment: Performed By: Physician [name], MD Debridement: Debridement Pre-procedure Yes Verification/Time Out Taken: Start Time: 15:19 Pain Control: Lidocaine 4% Topical Solution Level: Skin/Subcutaneous Tissue Total Area Debrided (L x 1.5 (cm) x 1 (cm) = 1.5 (cm) W): Tissue and other Viable, Non-Viable, Fibrin/Slough, Subcutaneous material debrided: Instrument: Curette Bleeding: Minimum Hemostasis Achieved: Pressure End Time: 15:21 Procedural Pain: 0 Post Procedural Pain: 0 Response to Treatment: Procedure was tolerated well Post Debridement Measurements of Total Wound Length: (cm) 1.5 Width: (cm) 1 Depth: (cm) 0.3 Volume: (cm) 0.353 Post Procedure Diagnosis Same as Pre-procedure Electronic Signature(s) Signed: 01/24/2016 3:57:44 PM By: [name] MD Signed: 01/24/2016 4:31:46 PM By: Montey Hora Entered By: [name] on 01/24/2016 15:41:31 Crockett, [name] ([id number]) Gordner, [name]  ([id number]) -------------------------------------------------------------------------------- HPI Details [name] Date of Service: 01/24/2016 2:15 PM Patient Name: C. Patient Account Number: [id number] Medical Record Treating RN: Montey Hora [id number] Number: Other Clinician: December 25, 1934 (81 y.o. Treating [name], [place] Date of Birth/Sex: Female) Physician/Extender: G [place] Physician: Referring Physician: [name] in Treatment: 9 History of Present Illness HPI Description: 11/22/15; this is Beran is a 81 year old woman who lives at home on her own. According to the patient and her daughter was present she has had long-standing edema in her legs dating back many years. She also has a history of chronic systolic heart failure, atrial fibrillation and is status post mitral  valve replacement. [facility] showed an ejection fraction of 40-45% she is on Lasix 60 mg a day and spironolactone 25 mg a day. Her current problem began in December around Christmas time she developed a small hematoma in the medial part of her left leg which rapidly expanded to a very large hematoma that required surgical debridement. This situation was complicated by the fact that the patient is on long-standing Coumadin for mechanical heart valve. She went to the OR had this evacuated on January 4 17. The wound has gradually improved however she has developed a small wounds around this area and more recently a wound on the right lateral leg. She is weeping edema fluid. The patient is already been to see vascular surgery. It was recommended that she wear Unna boots, she did not tolerate this due to pain in the left leg. She was then prescribed Juzo stockings and really can't get these on herself although truthfully there is probably too much edema for a Juzo stockings currently. She is not a diabetic and has no history of PAD or claudication  that I could elicit. She does not use the external compression pumps reliably. She comes today with notes from her primary physician and Dr. Ronalee Belts both recommending various forms of compression but the patient does not really complied with them. Has been using the external compression pumps with some regularity but certainly not daily on the right leg and this has helped. I also note that her daughter tells me the history that she does not sleep in bed at home. She has a hospital bed but with her legs up she finds this painful so she sleeps in the couch sitting up with her legs dependent. 11/29/15; the patient is arrives today accompanied by her son. He expresses satisfaction that she is maintained the compression all week. 12/06/15; the patient has kept her Profore light wraps on, we have good edema control no major change in the wounds we have been using Aquacel 12/13/15; changed to RTD last week. One of the 3 wounds on her left medial leg is healed she has 2 remaining wounds here and one on the right lateral leg. 12/18/2015 -- the patient was at Dr. Nino Parsley office today and he was seeing her for an arterial study. While the wrap was being removed she had an inadvertent laceration of the left proximal anterior leg which bled quite profusely and a compression dressing was applied over this and the patient was here to get her Profore wraps done. I was asked to see the patient to make an assessment and treat appropriately. 12/26/15; the patient's injury on the left proximal leg and Steri-Strips removed after soaking. There is an open area here. The original wounds to still open on the right lateral and left medial leg. 01/03/16 patient's injury on her proximal left leg looks quite good. Still small open area on the medial left leg which appears to be improving. The area on the right lateral leg still substantially open with no real improvement in wound depth. Her edema control is marginal with a  Profore light. We have been using RTD [place], Madine C. ([id number]) for 3-4 weeks without any major change 01/10/16: wounds without s/s of infection. vascular results are pending regarding arterial studies. 01/17/16; patient comes in today complaining of severe pain however I think most of this is in the right hip not related to her wounds. She continues with a oval-shaped wound on the right lateral  leg, trauma to the left anterior leg just below her tibial plateau. She has a smaller eschar on the left anterior leg. She is being using Prisma however she informs [place] today that she is actually allergic to silver, nose this from a previous application at [facility] some years ago 01/24/16; edema is not so well controlled today, I think I reduced her to [place] bilaterally last week. The area which was a scissor injury on her left upper anterior lower leg is fully epithelialized. She only has a small open area remaining on the medial aspect of her left leg. The oval-shaped wound on the right mid lateral leg may be a bit smaller. Debrided of surface slough nonviable subcutaneous tissue. I had changed to collagen 2 weeks ago in an attempt to get this to fail and Electronic Signature(s) Signed: 01/24/2016 3:57:44 PM By: [name] MD Entered By: [name] on 01/24/2016 15:43:11 Bhatnagar, [name] ([id number]) -------------------------------------------------------------------------------- Physical Exam Details [name] Date of Service: 01/24/2016 2:15 PM Patient Name: C. Patient Account Number: [id number] Medical Record Treating RN: Montey Hora [id number] Number: Other Clinician: 1935-02-17 (81 y.o. Treating [name], [name] Date of Birth/Sex: Female) Physician/Extender: G [place] Physician: Referring Physician: [name] in Treatment: 9 Notes Wound exam; edema not under good control today. I'll have to put her up to Profore wraps. The area on  the right lateral leg continues to be the most substantial wound. This was debrided. We will reapply collagen however if this is not changed next week we will need to change dressing. The areas on her left anterior leg is fully epithelialized of the left medial leg wound is only very tiny and should be closed by next week Electronic Signature(s) Signed: 01/24/2016 3:57:44 PM By: [name] MD Entered By: [name] on 01/24/2016 15:44:00 Holl[place]head, [name] ([id number]) -------------------------------------------------------------------------------- Physician Orders Details [name] Date of Service: 01/24/2016 2:15 PM Patient Name: C. Patient Account Number: [id number] Medical Record Treating RN: Montey Hora [id number] Number: Other Clinician: [birth year] (81 y.o. Treating [name], [place] Date of Birth/Sex: Female) Physician/Extender: G Primary Care [name] Physician: Referring Physician: [name] in Treatment: 9 Verbal / Phone Orders: Yes Clinician: Montey Hora Read Back and Verified: Yes Diagnosis Coding Wound Cleansing Wound #1 Left,Medial Lower Leg o Cleanse wound with mild soap and water Wound #2 Right,Lateral Lower Leg o Cleanse wound with mild soap and water Wound #3 Left,Proximal,Anterior Lower Leg o Cleanse wound with mild soap and water Anesthetic Wound #1 Left,Medial Lower Leg o Topical Lidocaine 4% cream applied to wound bed prior to debridement Wound #2 Right,Lateral Lower Leg o Topical Lidocaine 4% cream applied to wound bed prior to debridement Wound #3 Left,Proximal,Anterior Lower Leg o Topical Lidocaine 4% cream applied to wound bed prior to debridement Primary Wound Dressing Wound #1 Left,Medial Lower Leg o Promogran Wound #2 Right,Lateral Lower Leg o Promogran Wound #3 Left,Proximal,Anterior Lower Leg o Promogran Secondary Dressing Wound #1 Left,Medial Lower Leg o ABD pad W[place], Birdella  C. ([id number]) Wound #2 Right,Lateral Lower Leg o ABD pad Wound #3 Left,Proximal,Anterior Lower Leg o ABD pad Dressing Change Frequency Wound #1 Left,Medial Lower Leg o Change dressing every week Wound #2 Right,Lateral Lower Leg o Change dressing every week Wound #3 Left,Proximal,Anterior Lower Leg o Change dressing every week Follow-up Appointments Wound #1 Left,Medial Lower Leg o Return Appointment in 1 week. Wound #2 Right,Lateral Lower Leg o Return Appointment in 1 week. Wound #3 Left,Proximal,Anterior Lower Leg o Return Appointment in  1 week. Edema Control Wound #1 Left,Medial Lower Leg o 4 Layer Compression System - Bilateral Wound #2 Right,Lateral Lower Leg o 4 Layer Compression System - Bilateral Wound #3 Left,Proximal,Anterior Lower Leg o 4 Layer Compression System - Bilateral Electronic Signature(s) Signed: 01/24/2016 3:57:44 PM By: [name] MD Signed: 01/24/2016 4:31:46 PM By: Montey Hora Entered By: Montey Hora on 01/24/2016 15:25:32 Peale, [name] ([id number]) -------------------------------------------------------------------------------- Problem List Details [name] Date of Service: 01/24/2016 2:15 PM Patient Name: C. Patient Account Number: [id number] Medical Record Treating RN: Montey Hora [id number] Number: Other Clinician: Jul 17, 1934 (81 y.o. Treating [name], [name] Date of Birth/Sex: Female) Physician/Extender: G Primary Care [name] Physician: Referring Physician: [name] in Treatment: 9 Active Problems ICD-10 Encounter Code Description Active Date Diagnosis I87.333 Chronic venous hypertension (idiopathic) with ulcer and 11/22/2015 Yes inflammation of bilateral lower extremity I89.0 Lymphedema, not elsewhere classified 11/22/2015 Yes [id number] Chronic systolic (congestive) heart failure 11/22/2015 Yes S81.812A Laceration without foreign body, left lower leg, initial 12/18/2015  Yes encounter Inactive Problems Resolved Problems Electronic Signature(s) Signed: 01/24/2016 3:57:44 PM By: [name] MD Entered By: [name] on 01/24/2016 15:40:57 Scherzinger, [name] ([id number]) -------------------------------------------------------------------------------- Progress Note Details [name] Date of Service: 01/24/2016 2:15 PM Patient Name: C. Patient Account Number: [id number] Medical Record Treating RN: Montey Hora [id number] Number: Other Clinician: 1935/02/19 (81 y.o. Treating [name], [place] Date of Birth/Sex: Female) Physician/Extender: G Primary Care [name] Physician: Referring Physician: [name] in Treatment: 9 Subjective Chief Complaint Information obtained from Patient Patient is here for review of bilateral lower extremity wounds dating back to December 2016 History of Present Illness (HPI) 11/22/15; this is Mahana is a 81 year old woman who lives at home on her own. According to the patient and her daughter was present she has had long-standing edema in her legs dating back many years. She also has a history of chronic systolic heart failure, atrial fibrillation and is status post mitral valve replacement. Last [facility]ogram [facility] showed an ejection fraction of 40-45% she is on Lasix 60 mg a day and spironolactone 25 mg a day. Her current problem began in December around Christmas time she developed a small hematoma in the medial part of her left leg which rapidly expanded to a very large hematoma that required surgical debridement. This situation was complicated by the fact that the patient is on long-standing Coumadin for mechanical heart valve. She went to the OR had this evacuated on January 4 17. The wound has gradually improved however she has developed a small wounds around this area and more recently a wound on the right lateral leg. She is weeping edema fluid. The patient is already  been to see vascular surgery. It was recommended that she wear Unna boots, she did not tolerate this due to pain in the left leg. She was then prescribed Juzo stockings and really can't get these on herself although truthfully there is probably too much edema for a Juzo stockings currently. She is not a diabetic and has no history of PAD or claudication that I could elicit. She does not use the external compression pumps reliably. She comes today with notes from her primary physician and Dr. Ronalee Belts both recommending various forms of compression but the patient does not really complied with them. Has been using the external compression pumps with some regularity but certainly not daily on the right leg and this has helped. I also note that her daughter tells me the history that she does  not sleep in bed at home. She has a hospital bed but with her legs up she finds this painful so she sleeps in the couch sitting up with her legs dependent. 11/29/15; the patient is arrives today accompanied by her son. He expresses satisfaction that she is maintained the compression all week. 12/06/15; the patient has kept her Profore light wraps on, we have good edema control no major change in the wounds we have been using Aquacel 12/13/15; changed to RTD last week. One of the 3 wounds on her left medial leg is healed she has 2 remaining wounds here and one on the right lateral leg. 12/18/2015 -- the patient was at Dr. Nino Parsley office today and he was seeing her for an arterial study. While the wrap was being removed she had an inadvertent laceration of the left proximal anterior leg which bled quite profusely and a compression dressing was applied over this and the patient was here to get her Profore wraps done. I was asked to see the patient to make an assessment and treat appropriately. MICHELLEANN, VERT ([id number]) 12/26/15; the patient's injury on the left proximal leg and Steri-Strips removed after  soaking. There is an open area here. The original wounds to still open on the right lateral and left medial leg. 01/03/16 patient's injury on her proximal left leg looks quite good. Still small open area on the medial left leg which appears to be improving. The area on the right lateral leg still substantially open with no real improvement in wound depth. Her edema control is marginal with a Profore light. We have been using RTD for 3-4 weeks without any major change 01/10/16: wounds without s/s of infection. vascular results are pending regarding arterial studies. 01/17/16; patient comes in today complaining of severe pain however I think most of this is in the right hip not related to her wounds. She continues with a oval-shaped wound on the right lateral leg, trauma to the left anterior leg just below her tibial plateau. She has a smaller eschar on the left anterior leg. She is being using Prisma however she informs [place] today that she is actually allergic to silver, nose this from a previous application at [facility] some years ago 01/24/16; edema is not so well controlled today, I think I reduced her to [place] bilaterally last week. The area which was a scissor injury on her left upper anterior lower leg is fully epithelialized. She only has a small open area remaining on the medial aspect of her left leg. The oval-shaped wound on the right mid lateral leg may be a bit smaller. Debrided of surface slough nonviable subcutaneous tissue. I had changed to collagen 2 weeks ago in an attempt to get this to fail and Objective Constitutional Vitals Time Taken: 2:55 PM, Height: 68 in, Weight: 178 lbs, BMI: 27.1, Temperature: 98.0 F, Pulse: 48 bpm, Respiratory Rate: 18 breaths/min, Blood Pressure: 125/47 mmHg. Integumentary (Hair, Skin) Wound #1 status is Open. Original cause of wound was Gradually Appeared. The wound is located on the Left,Medial Lower Leg. The wound measures 0.6cm length x 0.3cm  width x 0.1cm depth; 0.141cm^2 area and 0.014cm^3 volume. The wound is limited to skin breakdown. There is no tunneling or undermining noted. There is a none present amount of drainage noted. The wound margin is distinct with the outline attached to the wound base. There is medium (34-66%) red granulation within the wound bed. There is a medium (34-66%) amount of necrotic tissue within the  wound bed including Eschar. The periwound skin appearance exhibited: Moist. The periwound skin appearance did not exhibit: Callus, Crepitus, Excoriation, Fluctuance, Friable, Induration, Localized Edema, Rash, Scarring, Dry/Scaly, Maceration, Atrophie Blanche, Cyanosis, Ecchymosis, Hemosiderin Staining, Mottled, Pallor, Rubor, Erythema. Periwound temperature was noted as No Abnormality. The periwound has tenderness on palpation. Wound #2 status is Open. Original cause of wound was Gradually Appeared. The wound is located on the Right,Lateral Lower Leg. The wound measures 1.5cm length x 1cm width x 0.3cm depth; 1.178cm^2 area and 0.353cm^3 volume. The wound is limited to skin breakdown. There is no tunneling or undermining noted. There is a small amount of serosanguineous drainage noted. The wound margin is distinct with the outline attached to the wound base. There is medium (34-66%) pink granulation within the wound bed. There is a medium (34-66%) amount of necrotic tissue within the wound bed including Adherent Slough. The periwound skin appearance exhibited: Localized Edema, Moist. The periwound skin appearance did not exhibit: Callus, Crepitus, Excoriation, Fluctuance, Friable, Induration, Rash, Scarring, Dry/Scaly, Abate, Luisana C. ([id number]) Maceration, Atrophie Blanche, Cyanosis, Ecchymosis, Hemosiderin Staining, Mottled, Pallor, Rubor, Erythema. Periwound temperature was noted as No Abnormality. The periwound has tenderness on palpation. Wound #3 status is Open. Original cause of wound was Trauma.  The wound is located on the Left,Proximal,Anterior Lower Leg. The wound measures 0.5cm length x 0.1cm width x 0.1cm depth; 0.039cm^2 area and 0.004cm^3 volume. The wound is limited to skin breakdown. There is no tunneling or undermining noted. There is a none present amount of drainage noted. The wound margin is flat and intact. There is no granulation within the wound bed. There is a large (67-100%) amount of necrotic tissue within the wound bed including Eschar. The periwound skin appearance exhibited: Localized Edema. The periwound skin appearance did not exhibit: Moist. Periwound temperature was noted as No Abnormality. The periwound has tenderness on palpation. Assessment Active Problems ICD-10 I87.333 - Chronic venous hypertension (idiopathic) with ulcer and inflammation of bilateral lower extremity I89.0 - Lymphedema, not elsewhere classified [id number] - Chronic systolic (congestive) heart failure [id number] - Laceration without foreign body, left lower leg, initial encounter Procedures Wound #1 Wound #1 is a Lymphedema located on the Left,Medial Lower Leg . There was a Skin/Subcutaneous Tissue Debridement [id number]) debridement with total area of 0.18 sq cm performed by [name], MD. with the following instrument(s): Curette to remove Viable and Non-Viable tissue/material including Fibrin/Slough and Subcutaneous after achieving pain control using Lidocaine 4% Topical Solution. A time out was conducted prior to the start of the procedure. A Minimum amount of bleeding was controlled with Pressure. The procedure was tolerated well with a pain level of 0 throughout and a pain level of 0 following the procedure. Post Debridement Measurements: 0.6cm length x 0.3cm width x 0.1cm depth; 0.014cm^3 volume. Post procedure Diagnosis Wound #1: Same as Pre-Procedure Wound #2 Wound #2 is a Lymphedema located on the Right,Lateral Lower Leg . There was a Skin/Subcutaneous Tissue Debridement  [id number]) debridement with total area of 1.5 sq cm performed by [name], MD. with the following instrument(s): Curette to remove Viable and Non-Viable tissue/material including Fibrin/Slough and Subcutaneous after achieving pain control using Lidocaine 4% Topical Solution. Reither, Caiden C. ([id number]) A time out was conducted prior to the start of the procedure. A Minimum amount of bleeding was controlled with Pressure. The procedure was tolerated well with a pain level of 0 throughout and a pain level of 0 following the procedure. Post Debridement Measurements: 1.5cm length x 1cm width  x 0.3cm depth; 0.353cm^3 volume. Post procedure Diagnosis Wound #2: Same as Pre-Procedure Plan Wound Cleansing: Wound #1 Left,Medial Lower Leg: Cleanse wound with mild soap and water Wound #2 Right,Lateral Lower Leg: Cleanse wound with mild soap and water Wound #3 Left,Proximal,Anterior Lower Leg: Cleanse wound with mild soap and water Anesthetic: Wound #1 Left,Medial Lower Leg: Topical Lidocaine 4% cream applied to wound bed prior to debridement Wound #2 Right,Lateral Lower Leg: Topical Lidocaine 4% cream applied to wound bed prior to debridement Wound #3 Left,Proximal,Anterior Lower Leg: Topical Lidocaine 4% cream applied to wound bed prior to debridement Primary Wound Dressing: Wound #1 Left,Medial Lower Leg: Promogran Wound #2 Right,Lateral Lower Leg: Promogran Wound #3 Left,Proximal,Anterior Lower Leg: Promogran Secondary Dressing: Wound #1 Left,Medial Lower Leg: ABD pad Wound #2 Right,Lateral Lower Leg: ABD pad Wound #3 Left,Proximal,Anterior Lower Leg: ABD pad Dressing Change Frequency: Wound #1 Left,Medial Lower Leg: Change dressing every week Wound #2 Right,Lateral Lower Leg: Change dressing every week Wound #3 Left,Proximal,Anterior Lower Leg: Change dressing every week Follow-up Appointments: Wound #1 Left,Medial Lower Leg: Shipley, Nayara C.  ([id number]) Return Appointment in 1 week. Wound #2 Right,Lateral Lower Leg: Return Appointment in 1 week. Wound #3 Left,Proximal,Anterior Lower Leg: Return Appointment in 1 week. Edema Control: Wound #1 Left,Medial Lower Leg: 4 Layer Compression System - Bilateral Wound #2 Right,Lateral Lower Leg: 4 Layer Compression System - Bilateral Wound #3 Left,Proximal,Anterior Lower Leg: 4 Layer Compression System - Bilateral #1 Promogran now under Profore wraps bilaterally #2 I am hopeful the left leg will be closed by next week. If there is not measurable improvements in the oval-shaped wound on the right anterior lateral leg sitter changing to RTD Electronic Signature(s) Signed: 01/24/2016 3:57:44 PM By: [name] MD Entered By: [name] on 01/24/2016 15:44:49 Demarinis, [name] ([id number]) -------------------------------------------------------------------------------- SuperBill Details Patient Name: [name] Date of Service: 01/24/2016 Medical Record Patient Account Number: [id number] Number: Treating RN: Montey Hora 14-Mar-1935 (80 y.o. Other Clinician: Date of Birth/Sex: Female) Treating [name], [name] Primary Care Physician/Extender: Claudette Laws Physician: Suella Grove in Treatment: 9 Referring Physician: [name] Diagnosis Coding ICD-10 Codes Code Description Chronic venous hypertension (idiopathic) with ulcer and inflammation of bilateral lower I87.333 extremity I89.0 Lymphedema, not elsewhere classified [id number] Chronic systolic (congestive) heart failure S81.812A Laceration without foreign body, left lower leg, initial encounter Facility Procedures CPT4: Description Modifier Quantity Code [id number] - DEB SUBQ TISSUE 20 SQ CM/< 1 ICD-10 Description Diagnosis I87.333 Chronic venous hypertension (idiopathic) with ulcer and inflammation of bilateral lower extremity Physician Procedures CPT4: Description Modifier Quantity Code  [id number] - WC PHYS SUBQ TISS 20 SQ CM 1 ICD-10 Description Diagnosis I87.333 Chronic venous hypertension (idiopathic) with ulcer and inflammation of bilateral lower extremity Electronic Signature(s) Signed: 01/24/2016 3:57:44 PM By: [name] MD Entered By: [name] on 01/24/2016 15:45:28

## 2016-01-25 NOTE — Progress Notes (Signed)
Daily Session Note  Patient Details  Name: [name] MRN: [id number] Date of Birth: December 30, 1934 Referring Provider:    Encounter Date: 01/25/2016  Check In:     Session Check In - 01/25/16 1639      Check-In   Location [facility]   Staff Present Gerlene Burdock, RN, Vickki Hearing, BA, ACSM CEP, Exercise Physiologist   Supervising physician immediately available to respond to emergencies See telemetry face sheet for immediately available ER MD   Medication changes reported     No   Fall or balance concerns reported    No   Warm-up and Cool-down Performed on first and last piece of equipment   Resistance Training Performed Yes   VAD Patient? No     Pain Assessment   Currently in Pain? No/denies           Exercise Prescription Changes - 01/25/16 1400      Exercise Review   Progression Yes     Response to Exercise   Blood Pressure (Admit) 122/60   Blood Pressure (Exercise) 128/70   Blood Pressure (Exit) 132/80   Heart Rate (Admit) 59 bpm   Heart Rate (Exercise) 58 bpm   Heart Rate (Exit) 44 bpm   Rating of Perceived Exertion (Exercise) 13   Duration Progress to 45 minutes of aerobic exercise without signs/symptoms of physical distress   Intensity THRR unchanged     Resistance Training   Training Prescription Yes   Weight 1   Reps 10-15     NuStep   Level 4      Goals Met:  Proper associated with RPD/PD & O2 Sat Exercise tolerated well  Goals Unmet:  Not Applicable  Comments:     Dr. [name] is Medical Director for [place].

## 2016-01-29 ENCOUNTER — Ambulatory Visit: Payer: Medicare Other

## 2016-01-29 ENCOUNTER — Encounter: Payer: Medicare Other | Admitting: *Deleted

## 2016-01-29 DIAGNOSIS — I5042 Chronic combined systolic (congestive) and diastolic (congestive) heart failure: Secondary | ICD-10-CM | POA: Diagnosis not present

## 2016-01-29 DIAGNOSIS — Z952 Presence of prosthetic heart valve: Secondary | ICD-10-CM

## 2016-01-29 DIAGNOSIS — Z9889 Other specified postprocedural states: Secondary | ICD-10-CM

## 2016-01-29 NOTE — Progress Notes (Signed)
Daily Session Note  Patient Details  Name: MERTHA CLYATT MRN: [id number] Date of Birth: 1935/03/04 Referring Provider:    Encounter Date: 01/29/2016  Check In:     Session Check In - 01/29/16 1634      Check-In   Location [facility]   Staff Present Gerlene Burdock, RN, Moises Blood, BS, ACSM CEP, Exercise Physiologist;Amanda Oletta Darter, [place], ACSM CEP, Exercise Physiologist   Supervising physician immediately available to respond to emergencies See telemetry face sheet for immediately available ER MD   Medication changes reported     No   Fall or balance concerns reported    No   Warm-up and Cool-down Performed on first and last piece of equipment   Resistance Training Performed Yes   VAD Patient? No     VAD patient   Has back up controller? No     Pain Assessment   Currently in Pain? No/denies   Multiple Pain Sites No         Goals Met:  Independence with exercise equipment Exercise tolerated well No report of cardiac concerns or symptoms Strength training completed today  Goals Unmet:  Not Applicable  Comments: Patient completed exercise prescription and all exercise goals during rehab session. The exercise was tolerated well and the patient is progressing in the program.     Dr. [name] is Medical Director for [place] [facility].

## 2016-01-31 ENCOUNTER — Encounter: Payer: Medicare Other | Attending: Cardiovascular Disease

## 2016-01-31 ENCOUNTER — Ambulatory Visit: Payer: Medicare Other

## 2016-01-31 ENCOUNTER — Encounter: Payer: Medicare Other | Admitting: Internal Medicine

## 2016-01-31 DIAGNOSIS — Z9889 Other specified postprocedural states: Secondary | ICD-10-CM | POA: Diagnosis not present

## 2016-01-31 DIAGNOSIS — I5042 Chronic combined systolic (congestive) and diastolic (congestive) heart failure: Secondary | ICD-10-CM | POA: Diagnosis present

## 2016-01-31 DIAGNOSIS — I87333 Chronic venous hypertension (idiopathic) with ulcer and inflammation of bilateral lower extremity: Secondary | ICD-10-CM | POA: Diagnosis not present

## 2016-01-31 DIAGNOSIS — Z79899 Other long term (current) drug therapy: Secondary | ICD-10-CM | POA: Insufficient documentation

## 2016-01-31 DIAGNOSIS — Z7901 Long term (current) use of anticoagulants: Secondary | ICD-10-CM | POA: Diagnosis not present

## 2016-01-31 DIAGNOSIS — I890 Lymphedema, not elsewhere classified: Secondary | ICD-10-CM | POA: Diagnosis not present

## 2016-02-01 ENCOUNTER — Ambulatory Visit: Payer: Medicare Other

## 2016-02-01 DIAGNOSIS — I5042 Chronic combined systolic (congestive) and diastolic (congestive) heart failure: Secondary | ICD-10-CM

## 2016-02-01 DIAGNOSIS — Z952 Presence of prosthetic heart valve: Secondary | ICD-10-CM

## 2016-02-01 DIAGNOSIS — Z9889 Other specified postprocedural states: Secondary | ICD-10-CM

## 2016-02-01 NOTE — Progress Notes (Signed)
[name], [name] ([id number]) Visit Report for 01/31/2016 Chief Complaint Document Details CHRISTLE, BRUGMAN Date of Service: 01/31/2016 3:00 PM Patient Name: C. Patient Account Number: [id number] Medical Record Treating RN: Montey Hora [id number] Number: Other Clinician: 03-25-1935 (81 y.o. Treating [name], [name] Date of Birth/Sex: Female) Physician/Extender: G Primary Care [name] Physician: Referring Physician: [name] in Treatment: 10 Information Obtained from: Patient Chief Complaint Patient is here for review of bilateral lower extremity wounds dating back to December 2016 Electronic Signature(s) Signed: 01/31/2016 4:24:05 PM By: [name] MD Entered By: [name] on 01/31/2016 16:16:06 [place], [name] ([id number]) -------------------------------------------------------------------------------- HPI Details [name] Date of Service: 01/31/2016 3:00 PM Patient Name: C. Patient Account Number: [id number] Medical Record Treating RN: Montey Hora [id number] Number: Other Clinician: 20-[name]-1935 (81 y.o. Treating [name], [place] Date of Birth/Sex: Female) Physician/Extender: G [place] Physician: Referring Physician: [name] in Treatment: 10 History of Present Illness HPI Description: 11/22/15; this is Nealey is a 81 year old woman who lives at home on her own. According to the patient and her daughter was present she has had long-standing edema in her legs dating back many years. She also has a history of chronic systolic heart failure, atrial fibrillation and is status post mitral valve replacement. Last [facility]ogram I [facility] showed an ejection fraction of 40-45% she is on Lasix 60 mg a day and spironolactone 25 mg a day. Her current problem began in December around Christmas time she developed a small hematoma in the medial part of her left leg which rapidly expanded to a very large  hematoma that required surgical debridement. This situation was complicated by the fact that the patient is on long-standing Coumadin for mechanical heart valve. She went to the OR had this evacuated on January 4 /17. The wound has gradually improved however she has developed a small wounds around this area and more recently a wound on the right lateral leg. She is weeping edema fluid. The patient is already been to see vascular surgery. It was recommended that she wear Unna boots, she did not tolerate this due to pain in the left leg. She was then prescribed Juzo stockings and really can't get these on herself although truthfully there is probably too much edema for a Juzo stockings currently. She is not a diabetic and has no history of PAD or claudication that I could elicit. She does not use the external compression pumps reliably. She comes today with notes from her primary physician and Dr. Ronalee Belts both recommending various forms of compression but the patient does not really complied with them. Has been using the external compression pumps with some regularity but certainly not daily on the right leg and this has helped. I also note that her daughter tells me the history that she does not sleep in bed at home. She has a hospital bed but with her legs up she finds this painful so she sleeps in the couch sitting up with her legs dependent. 11/29/15; the patient is arrives today accompanied by her son. He expresses satisfaction that she is maintained the compression all week. 12/06/15; the patient has kept her Profore light wraps on, we have good edema control no major change in the wounds we have been using Aquacel 12/13/15; changed to RTD last week. One of the 3 wounds on her left medial leg is healed she has 2 remaining wounds here and one on the right lateral leg. 12/18/2015 -- the patient was at Dr.  Schnier's office today and he was seeing her for an arterial study. While the wrap was being  removed she had an inadvertent laceration of the left proximal anterior leg which bled quite profusely and a compression dressing was applied over this and the patient was here to get her Profore wraps done. I was asked to see the patient to make an assessment and treat appropriately. 12/26/15; the patient's injury on the left proximal leg and Steri-Strips removed after soaking. There is an open area here. The original wounds to still open on the right lateral and left medial leg. 01/03/16 patient's injury on her proximal left leg looks quite good. Still small open area on the medial left leg which appears to be improving. The area on the right lateral leg still substantially open with no real improvement in wound depth. Her edema control is marginal with a Profore light. We have been using RTD [name], [name] ([id number]) for 3-4 weeks without any major change 01/10/16: wounds without s/s of infection. vascular results are pending regarding arterial studies. 01/17/16; patient comes in today complaining of severe pain however I think most of this is in the right hip not related to her wounds. She continues with a oval-shaped wound on the right lateral leg, trauma to the left anterior leg just below her tibial plateau. She has a smaller eschar on the left anterior leg. She is being using Prisma however she informs [place] today that she is actually allergic to silver, nose this from a previous application at [facility] some years ago 01/24/16; edema is not so well controlled today, I think I reduced her to [place] bilaterally last week. The area which was a scissor injury on her left upper anterior lower leg is fully epithelialized. She only has a small open area remaining on the medial aspect of her left leg. The oval-shaped wound on the right mid lateral leg may be a bit smaller. Debrided of surface slough nonviable subcutaneous tissue. I had changed to collagen 2 weeks ago in an attempt to get this to  close 01/31/16 all the patient's wounds on her left leg give healed. We have good edema control with bilateral Profore lites which she has been compliant with. She still has the oval-shaped wound on the right lateral calf allergic even this appears to be slowly improving. The patient has juxtalite stockings at home. She states she thinks she can put these on. She also has external compression pumps at home although I think her compliance with this has not been good in the past. She complains today of edema in her thighs. Tells me she takes 40 mg of Lasix daily Electronic Signature(s) Signed: 01/31/2016 4:24:05 PM By: [name] MD Entered By: [name] on 01/31/2016 16:18:50 [name], [name] ([id number]) -------------------------------------------------------------------------------- Physical Exam Details [name] Date of Service: 01/31/2016 3:00 PM Patient Name: C. Patient Account Number: [id number] Medical Record Treating RN: Montey Hora [id number] Number: Other Clinician: December 25, 1934 (81 y.o. Treating [name], [name] Date of Birth/Sex: Female) Physician/Extender: G Primary Care [name] Physician: Referring Physician: [name] in Treatment: 10 Constitutional Sitting or standing Blood Pressure is within target range for patient.. Pulse regular and within target range for patient.Marland Kitchen Respirations regular, non-labored and within target range.. Temperature is normal and within the target range for the patient.. Patient's appearance is neat and clean. Appears in no acute distress. Well nourished and well developed.. Eyes Conjunctivae clear. No discharge.Marland Kitchen Respiratory Respiratory effort is easy and symmetric bilaterally. Rate is  normal at rest and on room air.. Few crackles in the left lower lobe. Right lung is clear. Cardiovascular Mechanical first sound no murmurs JVP is borderline elevated. Gastrointestinal (GI) Abdomen is soft and non-distended  without masses or tenderness. Bowel sounds active in all quadrants.. No liver or spleen enlargement or tenderness.Marland Kitchen Psychiatric No evidence of depression, anxiety, or agitation. Calm, cooperative, and communicative. Appropriate interactions and affect.. Notes Wound exam; all of her wounds on the left leg give healed. The right leg still has the oval-shaped wound in the right calf over this is improving both in terms of overall wound volume and the surface of the wound does not require debridement. Electronic Signature(s) Signed: 01/31/2016 4:24:05 PM By: [name] MD Entered By: [name] on 01/31/2016 16:20:34 [name] ([id number]) -------------------------------------------------------------------------------- Physician Orders Details [name] Date of Service: 01/31/2016 3:00 PM Patient Name: C. Patient Account Number: [id number] Medical Record Treating RN: Montey Hora [id number] Number: Other Clinician: [birth year] (81 y.o. Treating [name], [place] Date of Birth/Sex: Female) Physician/Extender: G Primary Care [name] Physician: Referring Physician: [name] in Treatment: 10 Verbal / Phone Orders: Yes Clinician: Montey Hora Read Back and Verified: Yes Diagnosis Coding Wound Cleansing Wound #2 Right,Lateral Lower Leg o Cleanse wound with mild soap and water Anesthetic Wound #2 Right,Lateral Lower Leg o Topical Lidocaine 4% cream applied to wound bed prior to debridement Primary Wound Dressing Wound #2 Right,Lateral Lower Leg o Promogran Secondary Dressing Wound #2 Right,Lateral Lower Leg o ABD pad Dressing Change Frequency Wound #2 Right,Lateral Lower Leg o Change dressing every week Follow-up Appointments Wound #2 Right,Lateral Lower Leg o Return Appointment in 1 week. Edema Control Wound #2 Right,Lateral Lower Leg o 4-Layer Compression System - Right Lower Extremity o Patient to wear own  Juxtalite/Juzo compression garment. - left leg Electronic Signature(s) [name], [name] ([id number]) Signed: 01/31/2016 4:23:15 PM By: Montey Hora Signed: 01/31/2016 4:24:05 PM By: [name] MD Entered By: Montey Hora on 01/31/2016 15:54:21 [name], [name] ([id number]) -------------------------------------------------------------------------------- Problem List Details [name] Date of Service: 01/31/2016 3:00 PM Patient Name: C. Patient Account Number: [id number] Medical Record Treating RN: Montey Hora [id number] Number: Other Clinician: 07-20-1934 (81 y.o. Treating [name], [name] Date of Birth/Sex: Female) Physician/Extender: G Primary Care [name] Physician: Referring Physician: [name] in Treatment: 10 Active Problems ICD-10 Encounter Code Description Active Date Diagnosis I87.333 Chronic venous hypertension (idiopathic) with ulcer and 11/22/2015 Yes inflammation of bilateral lower extremity I89.0 Lymphedema, not elsewhere classified 11/22/2015 Yes [id number] Chronic systolic (congestive) heart failure 11/22/2015 Yes S81.812A Laceration without foreign body, left lower leg, initial 12/18/2015 Yes encounter Inactive Problems Resolved Problems Electronic Signature(s) Signed: 01/31/2016 4:24:05 PM By: [name] MD Entered By: [name] on 01/31/2016 16:15:53 [name], [name] ([id number]) -------------------------------------------------------------------------------- Progress Note Details [name] Date of Service: 01/31/2016 3:00 PM Patient Name: C. Patient Account Number: [id number] Medical Record Treating RN: Montey Hora [id number] Number: Other Clinician: 1934/09/08 (81 y.o. Treating [name], [place] Date of Birth/Sex: Female) Physician/Extender: G Primary Care [name] Physician: Referring Physician: [name] in Treatment: 10 Subjective Chief Complaint Information obtained from  Patient Patient is here for review of bilateral lower extremity wounds dating back to December 2016 History of Present Illness (HPI) 11/22/15; this is [name] is a 81 year old woman who lives at home on her own. According to the patient and her daughter was present she has had long-standing edema in her legs dating back many years. She also has a history of chronic systolic heart  failure, atrial fibrillation and is status post mitral valve replacement. Last echocardiogram I see [facility] showed an ejection fraction of 40-45% she is on Lasix 60 mg a day and spironolactone 25 mg a day. Her current problem began in December around Christmas time she developed a small hematoma in the medial part of her left leg which rapidly expanded to a very large hematoma that required surgical debridement. This situation was complicated by the fact that the patient is on long-standing Coumadin for mechanical heart valve. She went to the OR had this evacuated on January 4 /17. The wound has gradually improved however she has developed a small wounds around this area and more recently a wound on the right lateral leg. She is weeping edema fluid. The patient is already been to see vascular surgery. It was recommended that she wear Unna boots, she did not tolerate this due to pain in the left leg. She was then prescribed Juzo stockings and really can't get these on herself although truthfully there is probably too much edema for a Juzo stockings currently. She is not a diabetic and has no history of PAD or claudication that I could elicit. She does not use the external compression pumps reliably. She comes today with notes from her primary physician and Dr. Ronalee Belts both recommending various forms of compression but the patient does not really complied with them. Has been using the external compression pumps with some regularity but certainly not daily on the right leg and this has helped. I also note that her  daughter tells me the history that she does not sleep in bed at home. She has a hospital bed but with her legs up she finds this painful so she sleeps in the couch sitting up with her legs dependent. 11/29/15; the patient is arrives today accompanied by her son. He expresses satisfaction that she is maintained the compression all week. 12/06/15; the patient has kept her Profore light wraps on, we have good edema control no major change in the wounds we have been using Aquacel 12/13/15; changed to RTD last week. One of the 3 wounds on her left medial leg is healed she has 2 remaining wounds here and one on the right lateral leg. 12/18/2015 -- the patient was at Dr. Nino Parsley office today and he was seeing her for an arterial study. While the wrap was being removed she had an inadvertent laceration of the left proximal anterior leg which bled quite profusely and a compression dressing was applied over this and the patient was here to get her Profore wraps done. I was asked to see the patient to make an assessment and treat appropriately. JAELYNNE, POLLINS ([id number]) 12/26/15; the patient's injury on the left proximal leg and Steri-Strips removed after soaking. There is an open area here. The original wounds to still open on the right lateral and left medial leg. 01/03/16 patient's injury on her proximal left leg looks quite good. Still small open area on the medial left leg which appears to be improving. The area on the right lateral leg still substantially open with no real improvement in wound depth. Her edema control is marginal with a Profore light. We have been using RTD for 3-4 weeks without any major change 01/10/16: wounds without s/s of infection. vascular results are pending regarding arterial studies. 01/17/16; patient comes in today complaining of severe pain however I think most of this is in the right hip not related to her wounds. She continues with  a oval-shaped wound on the right  lateral leg, trauma to the left anterior leg just below her tibial plateau. She has a smaller eschar on the left anterior leg. She is being using Prisma however she informs [place] today that she is actually allergic to silver, nose this from a previous application at [facility] some years ago 01/24/16; edema is not so well controlled today, I think I reduced her to [place] bilaterally last week. The area which was a scissor injury on her left upper anterior lower leg is fully epithelialized. She only has a small open area remaining on the medial aspect of her left leg. The oval-shaped wound on the right mid lateral leg may be a bit smaller. Debrided of surface slough nonviable subcutaneous tissue. I had changed to collagen 2 weeks ago in an attempt to get this to close 01/31/16 all the patient's wounds on her left leg give healed. We have good edema control with bilateral Profore lites which she has been compliant with. She still has the oval-shaped wound on the right lateral calf allergic even this appears to be slowly improving. The patient has juxtalite stockings at home. She states she thinks she can put these on. She also has external compression pumps at home although I think her compliance with this has not been good in the past. She complains today of edema in her thighs. Tells me she takes 40 mg of Lasix daily Objective Constitutional Sitting or standing Blood Pressure is within target range for patient.. Pulse regular and within target range for patient.Marland Kitchen Respirations regular, non-labored and within target range.. Temperature is normal and within the target range for the patient.. Patient's appearance is neat and clean. Appears in no acute distress. Well nourished and well developed.. Vitals Time Taken: 3:29 PM, Height: 68 in, Weight: 178 lbs, BMI: 27.1, Temperature: 97.7 F, Pulse: 51 bpm, Respiratory Rate: 18 breaths/min, Blood Pressure: 132/57 mmHg. Eyes Conjunctivae clear. No  discharge.Marland Kitchen Respiratory Respiratory effort is easy and symmetric bilaterally. Rate is normal at rest and on room air.. Few crackles in the left lower lobe. Right lung is clear. Cardiovascular Mechanical first sound no murmurs JVP is borderline elevated. [name], [name] ([id number]) Gastrointestinal (GI) Abdomen is soft and non-distended without masses or tenderness. Bowel sounds active in all quadrants.. No liver or spleen enlargement or tenderness.Marland Kitchen Psychiatric No evidence of depression, anxiety, or agitation. Calm, cooperative, and communicative. Appropriate interactions and affect.. General Notes: Wound exam; all of her wounds on the left leg give healed. The right leg still has the oval- shaped wound in the right calf over this is improving both in terms of overall wound volume and the surface of the wound does not require debridement. Integumentary (Hair, Skin) Wound #1 status is Healed - Epithelialized. Original cause of wound was Gradually Appeared. The wound is located on the Left,Medial Lower Leg. The wound measures 0cm length x 0cm width x 0cm depth; 0cm^2 area and 0cm^3 volume. The wound is limited to skin breakdown. There is no tunneling or undermining noted. There is a none present amount of drainage noted. The wound margin is distinct with the outline attached to the wound base. There is medium (34-66%) red granulation within the wound bed. There is no necrotic tissue within the wound bed. The periwound skin appearance exhibited: Moist. The periwound skin appearance did not exhibit: Callus, Crepitus, Excoriation, Fluctuance, Friable, Induration, Localized Edema, Rash, Scarring, Dry/Scaly, Maceration, Atrophie Blanche, Cyanosis, Ecchymosis, Hemosiderin Staining, Mottled, Pallor, Rubor, Erythema. Periwound temperature was  noted as No Abnormality. The periwound has tenderness on palpation. Wound #2 status is Open. Original cause of wound was Gradually Appeared. The wound is  located on the Right,Lateral Lower Leg. The wound measures 1.4cm length x 0.8cm width x 0.3cm depth; 0.88cm^2 area and 0.264cm^3 volume. The wound is limited to skin breakdown. There is no tunneling or undermining noted. There is a small amount of serosanguineous drainage noted. The wound margin is distinct with the outline attached to the wound base. There is medium (34-66%) pink granulation within the wound bed. There is a medium (34-66%) amount of necrotic tissue within the wound bed including Adherent Slough. The periwound skin appearance exhibited: Localized Edema, Moist. The periwound skin appearance did not exhibit: Callus, Crepitus, Excoriation, Fluctuance, Friable, Induration, Rash, Scarring, Dry/Scaly, Maceration, Atrophie Blanche, Cyanosis, Ecchymosis, Hemosiderin Staining, Mottled, Pallor, Rubor, Erythema. Periwound temperature was noted as No Abnormality. The periwound has tenderness on palpation. Wound #3 status is Healed - Epithelialized. Original cause of wound was Trauma. The wound is located on the Left,Proximal,Anterior Lower Leg. The wound measures 0cm length x 0cm width x 0cm depth; 0cm^2 area and 0cm^3 volume. The wound is limited to skin breakdown. There is no tunneling or undermining noted. There is a none present amount of drainage noted. The wound margin is flat and intact. There is no granulation within the wound bed. There is no necrotic tissue within the wound bed. The periwound skin appearance exhibited: Localized Edema. The periwound skin appearance did not exhibit: Moist. Periwound temperature was noted as No Abnormality. The periwound has tenderness on palpation. [name], [name] ([id number]) Assessment Active Problems ICD-10 I87.333 - Chronic venous hypertension (idiopathic) with ulcer and inflammation of bilateral lower extremity I89.0 - Lymphedema, not elsewhere classified [id number] - Chronic systolic (congestive) heart failure [id number] - Laceration  without foreign body, left lower leg, initial encounter Plan Wound Cleansing: Wound #2 Right,Lateral Lower Leg: Cleanse wound with mild soap and water Anesthetic: Wound #2 Right,Lateral Lower Leg: Topical Lidocaine 4% cream applied to wound bed prior to debridement Primary Wound Dressing: Wound #2 Right,Lateral Lower Leg: Promogran Secondary Dressing: Wound #2 Right,Lateral Lower Leg: ABD pad Dressing Change Frequency: Wound #2 Right,Lateral Lower Leg: Change dressing every week Follow-up Appointments: Wound #2 Right,Lateral Lower Leg: Return Appointment in 1 week. Edema Control: Wound #2 Right,Lateral Lower Leg: 4-Layer Compression System - Right Lower Extremity Patient to wear own Juxtalite/Juzo compression garment. - left leg #1 the patient's left leg is closed and I have changed her back to her juxtalite stockings. I am hopeful I don't regret that decision as her compliance with these is been poor the past. I have emphasized to her that she needs compression on the left leg both to control the edema and to protect her skin [name], [name] ([id number]) #2 we maintained Promogran and Profore lites to the right leg. This hopefully will closes the dimensions are improving #3 the patient has some evidence of fluid overload [edema in her thighs slight coccyx edema and elevation of her jugular venous pressure]. I've asked her to call her primary M.D. to see about an appointment to monitor her Lasix perhaps increase Electronic Signature(s) Signed: 01/31/2016 4:24:05 PM By: [name] MD Entered By: [name] on 01/31/2016 16:22:13 [name], [name] ([id number]) -------------------------------------------------------------------------------- SuperBill Details Patient Name: [name] C. Date of Service: 01/31/2016 Medical Record Patient Account Number: [id number] Number: Treating RN: Montey Hora 03/29/1935 (80 y.o. Other Clinician: Date of  Birth/Sex: Female) Treating [name], [name] Primary Care Physician/Extender:  Claudette Laws Physician: Suella [place] in Treatment: 10 Referring Physician: [name] Diagnosis Coding ICD-10 Codes Code Description Chronic venous hypertension (idiopathic) with ulcer and inflammation of bilateral lower I87.333 extremity I89.0 Lymphedema, not elsewhere classified [id number] Chronic systolic (congestive) heart failure S81.812A Laceration without foreign body, left lower leg, initial encounter Facility Procedures CPT4: Description Modifier Quantity Code [id number] (Facility Use Only) [phone] - [place] RT 1 LEG Physician Procedures CPT4: Description Modifier Quantity Code [id number] - WC PHYS LEVEL 3 - EST PT 1 ICD-10 Description Diagnosis I87.333 Chronic venous hypertension (idiopathic) with ulcer and inflammation of bilateral lower extremity Electronic Signature(s) Signed: 01/31/2016 4:24:05 PM By: [name] MD Entered By: [name] on 01/31/2016 16:22:52

## 2016-02-01 NOTE — Progress Notes (Signed)
Daily Session Note  Patient Details  Name: [name] MRN: [id number] Date of Birth: 06-Nov-1934 Referring Provider:    Encounter Date: 02/01/2016  Check In:     Session Check In - 02/01/16 1639      Check-In   Location [facility]   Staff Present Gerlene Burdock, RN, BSN;Bette Brienza, DPT, Burlene Arnt, BA, ACSM CEP, Exercise Physiologist   Supervising physician immediately available to respond to emergencies See telemetry face sheet for immediately available ER MD   Medication changes reported     Yes   Comments Metoprolol decreased to 1/2 tablet   Fall or balance concerns reported    No   Warm-up and Cool-down Performed on first and last piece of equipment   Resistance Training Performed Yes   VAD Patient? No     Pain Assessment   Currently in Pain? Other (Comment)         Goals Met:  No report of cardiac concerns or symptoms  Goals Unmet:  Not Applicable  Comments: Patient completed exercise prescription and all exercise goals during rehab session. The exercise was tolerated well and the patient is progressing in the program.    Dr. [name] is Medical Director for [facility].

## 2016-02-01 NOTE — Progress Notes (Signed)
[name], [name] ([id number]) Visit Report for 01/31/2016 Arrival Information Details Patient Name: [name], [name]. Date of Service: 01/31/2016 3:00 PM Medical Record Patient Account Number: [id number] Number: Treating RN: Montey Hora 1935/01/02 (80 y.o. Other Clinician: Date of Birth/Sex: Female) Treating [name], [name] Primary Care Physician/Extender: Claudette Laws Physician: Referring Physician: [name] in Treatment: 10 Visit Information History Since Last Visit Added or deleted any medications: No Patient Arrived: Cane Any new allergies or adverse reactions: No Arrival Time: 15:27 Had a fall or experienced change in No Accompanied By: self activities of daily living that may affect Transfer Assistance: None risk of falls: Patient Identification Verified: Yes Signs or symptoms of abuse/neglect since last No Secondary Verification Process Yes visito Completed: Hospitalized since last visit: No Patient Requires Transmission- No Pain Present Now: No Based Precautions: Patient Has Alerts: Yes Patient Alerts: Patient on Blood Thinner Merna Signature(s) Signed: 01/31/2016 4:23:15 PM By: Montey Hora Entered By: Montey Hora on 01/31/2016 15:27:32 Dake, [name] ([id number]) -------------------------------------------------------------------------------- Encounter Discharge Information Details Patient Name: Carrie Mulligan C. Date of Service: 01/31/2016 3:00 PM Medical Record Patient Account Number: [id number] Number: Treating RN: Montey Hora [birth year] (80 y.o. Other Clinician: Date of Birth/Sex: Female) Treating [name], [name] Primary Care Physician/Extender: Claudette Laws Physician: Referring Physician: [name] in Treatment: 10 Encounter Discharge Information Items Discharge Pain Level: 0 Discharge Condition: Stable Ambulatory Status: Cane Discharge  Destination: Home Transportation: Private Auto Accompanied By: self Schedule Follow-up Appointment: Yes Medication Reconciliation completed No and provided to Patient/Care [name]: Provided on Clinical Summary of Care: 01/31/2016 Form Type Recipient Paper Patient MS Electronic Signature(s) Signed: 01/31/2016 4:12:37 PM By: Ruthine Dose Entered By: Ruthine Dose on 01/31/2016 16:12:37 Gossard, [name] ([id number]) -------------------------------------------------------------------------------- Lower Extremity Assessment Details Patient Name: Carrie Mulligan C. Date of Service: 01/31/2016 3:00 PM Medical Record Patient Account Number: [id number] Number: Treating RN: Montey Hora 1935/03/31 (80 y.o. Other Clinician: Date of Birth/Sex: Female) Treating [name], [name] Primary Care Physician/Extender: Claudette Laws Physician: Referring Physician: [name] in Treatment: 10 Edema Assessment Assessed: [Left: No] [Right: No] Edema: [Left: Yes] [Right: Yes] Calf Left: Right: Point of Measurement: 36 cm From Medial Instep 35.4 cm 35 cm Ankle Left: Right: Point of Measurement: 10 cm From Medial Instep 22.3 cm 22.5 cm Vascular Assessment Pulses: Posterior Tibial Dorsalis Pedis Palpable: [Left:Yes] [Right:Yes] Extremity colors, hair growth, and conditions: Extremity Color: [Left:Hyperpigmented] [Right:Hyperpigmented] Hair Growth on Extremity: [Left:No] [Right:No] Temperature of Extremity: [Left:Warm] [Right:Warm] Capillary Refill: [Left:< 3 seconds] [Right:< 3 seconds] Electronic Signature(s) Signed: 01/31/2016 4:23:15 PM By: Montey Hora Entered By: Montey Hora on 01/31/2016 15:39:11 [name], [name] ([id number]) -------------------------------------------------------------------------------- Multi Wound Chart Details Patient Name: Carrie Mulligan C. Date of Service: 01/31/2016 3:00 PM Medical Record Patient Account Number:  [id number] Number: Treating RN: Montey Hora 03/08/1935 (80 y.o. Other Clinician: Date of Birth/Sex: Female) Treating [name], [name] Primary Care Physician/Extender: Claudette Laws Physician: Referring Physician: [name] in Treatment: 10 Vital Signs Height(in): 68 Pulse(bpm): 51 Weight(lbs): 178 Blood Pressure 132/57 (mmHg): Body Mass Index(BMI): 27 Temperature(F): 97.7 Respiratory Rate 18 (breaths/min): Photos: Wound Location: Left, Medial Lower Leg Right Lower Leg - Lateral Left Lower Leg - Anterior, Proximal Wounding Event: Gradually Appeared Gradually Appeared Trauma Primary Etiology: Lymphedema Lymphedema Trauma, Other Comorbid History: Cataracts, Arrhythmia, Cataracts, Arrhythmia, Cataracts, Arrhythmia, Hypotension, Hypotension, Hypotension, Osteoarthritis Osteoarthritis Osteoarthritis Date Acquired: 07/04/2015 07/04/2015 12/18/2015 Weeks of Treatment: 10 10 6  Wound Status: Healed - Epithelialized Open Healed - Epithelialized  Measurements L x W x D 0x0x0 1.4x0.8x0.3 0x0x0 (cm) Area (cm) : 0 0.88 0 Volume (cm) : 0 0.264 0 % Reduction in Area: 100.00% 37.80% 100.00% % Reduction in Volume: 100.00% -87.20% 100.00% Classification: Partial Thickness Partial Thickness Partial Thickness Exudate Amount: None Present Small None Present Exudate Type: N/A Serosanguineous N/A Exudate Color: N/A red, brown N/A Diaz, [name] ([id number]) Wound Margin: Distinct, outline attached Distinct, outline attached Flat and Intact Granulation Amount: Medium (34-66%) Medium (34-66%) None Present (0%) Granulation Quality: Red Pink N/A Necrotic Amount: None Present (0%) Medium (34-66%) None Present (0%) Exposed Structures: Fascia: No Fascia: No Fascia: No Fat: No Fat: No Fat: No Tendon: No Tendon: No Tendon: No Muscle: No Muscle: No Muscle: No Joint: No Joint: No Joint: No Bone: No Bone: No Bone: No Limited to Skin Limited to Skin Limited to  Skin Breakdown Breakdown Breakdown Epithelialization: Large (67-100%) None Large (67-100%) Periwound Skin Texture: Edema: No Edema: Yes Edema: Yes Excoriation: No Excoriation: No Induration: No Induration: No Callus: No Callus: No Crepitus: No Crepitus: No Fluctuance: No Fluctuance: No Friable: No Friable: No Rash: No Rash: No Scarring: No Scarring: No Periwound Skin Moist: Yes Moist: Yes Moist: No Moisture: Maceration: No Maceration: No Dry/Scaly: No Dry/Scaly: No Periwound Skin Color: Atrophie Blanche: No Atrophie Blanche: No No Abnormalities Noted Cyanosis: No Cyanosis: No Ecchymosis: No Ecchymosis: No Erythema: No Erythema: No Hemosiderin Staining: No Hemosiderin Staining: No Mottled: No Mottled: No Pallor: No Pallor: No Rubor: No Rubor: No Temperature: No Abnormality No Abnormality No Abnormality Tenderness on Yes Yes Yes Palpation: Wound Preparation: Ulcer Cleansing: Ulcer Cleansing: Ulcer Cleansing: Rinsed/Irrigated with Rinsed/Irrigated with Rinsed/Irrigated with Saline Saline Saline Topical Anesthetic Topical Anesthetic Topical Anesthetic Applied: Other: lidocaine Applied: Other: lidocaine Applied: None, Other: 4% 4% lidocaine 4% Treatment Notes Electronic Signature(s) Signed: 01/31/2016 4:23:15 PM By: Montey Hora Entered By: Montey Hora on 01/31/2016 15:53:29 Brumley, [name] ([id number]) KARSEN, HARTONG ([id number]) -------------------------------------------------------------------------------- Multi-Disciplinary Care Plan Details Patient Name: EMILIAH, ATTARD C. Date of Service: 01/31/2016 3:00 PM Medical Record Patient Account Number: [id number] Number: Treating RN: Montey Hora [birth year] (80 y.o. Other Clinician: Date of Birth/Sex: Female) Treating [name], [name] Primary Care Physician/Extender: Claudette Laws Physician: Referring Physician: [name] in Treatment: 10 Active  Inactive Orientation to the Wound Care Program Nursing Diagnoses: Knowledge deficit related to the wound healing center program Goals: Patient/caregiver will verbalize understanding of the [facility] Program Date Initiated: 11/22/2015 Goal Status: Active Interventions: Provide education on orientation to the wound center Notes: Venous Leg Ulcer Nursing Diagnoses: Knowledge deficit related to disease process and management Potential for venous Insuffiency (use before diagnosis confirmed) Goals: Patient will maintain optimal edema control Date Initiated: 11/22/2015 Goal Status: Active Patient/caregiver will verbalize understanding of disease process and disease management Date Initiated: 11/22/2015 Goal Status: Active Verify adequate tissue perfusion prior to therapeutic compression application Date Initiated: 11/22/2015 Goal Status: Active Interventions: Assess peripheral edema status every visit. JAMILLE, SAVOIA ([id number]) Compression as ordered Provide education on venous insufficiency Treatment Activities: Non-invasive vascular studies : 11/22/2015 Therapeutic compression applied : 11/22/2015 Notes: Wound/Skin Impairment Nursing Diagnoses: Impaired tissue integrity Knowledge deficit related to ulceration/compromised skin integrity Goals: Patient/caregiver will verbalize understanding of skin care regimen Date Initiated: 11/22/2015 Goal Status: Active Ulcer/skin breakdown will have a volume reduction of 30% by week 4 Date Initiated: 11/22/2015 Goal Status: Active Ulcer/skin breakdown will have a volume reduction of 50% by week 8 Date Initiated: 11/22/2015 Goal Status: Active Ulcer/skin breakdown will have  a volume reduction of 80% by week 12 Date Initiated: 11/22/2015 Goal Status: Active Ulcer/skin breakdown will heal within 14 weeks Date Initiated: 11/22/2015 Goal Status: Active Interventions: Assess patient/caregiver ability to perform ulcer/skin care  regimen upon admission and as needed Assess ulceration(s) every visit Provide education on ulcer and skin care Treatment Activities: Skin care regimen initiated : 11/22/2015 Topical wound management initiated : 11/22/2015 Notes: Electronic Signature(s) Signed: 01/31/2016 4:23:15 PM By: Gerrit Friends, [name] ([id number]) Entered By: Montey Hora on 01/31/2016 15:53:16 [name], [name] ([id number]) -------------------------------------------------------------------------------- Pain Assessment Details Patient Name: Carrie Mulligan C. Date of Service: 01/31/2016 3:00 PM Medical Record Patient Account Number: [id number] Number: Treating RN: Montey Hora [birth year] (80 y.o. Other Clinician: Date of Birth/Sex: Female) Treating [name], [name] Primary Care Physician/Extender: Claudette Laws Physician: Referring Physician: [name] in Treatment: 10 Active Problems Location of Pain Severity and Description of Pain Patient Has Paino No Site Locations Pain Management and Medication Current Pain Management: Notes Topical or injectable lidocaine is offered to patient for acute pain when surgical debridement is performed. If needed, Patient is instructed to use over the counter pain medication for the following 24-48 hours after debridement. Wound care MDs do not prescribed pain medications. Patient has chronic pain or uncontrolled pain. Patient has been instructed to make an appointment with their Primary Care Physician for pain management. Electronic Signature(s) Signed: 01/31/2016 4:23:15 PM By: Montey Hora Entered By: Montey Hora on 01/31/2016 15:27:40 Koslosky, [name] ([id number]) -------------------------------------------------------------------------------- Patient/Caregiver Education Details Patient Name: [name] Date of Service: 01/31/2016 3:00 PM Medical Record Patient Account Number:  [id number] Number: Treating RN: Montey Hora [birth year] (80 y.o. Other Clinician: Date of Birth/Gender: Female) Treating [name], [name] Primary Care Physician/Extender: Claudette Laws Physician: Suella Grove in Treatment: 10 Referring Physician: [name] Education Assessment Education Provided To: Patient Education Topics Provided Venous: Handouts: Other: wear juxtalite on left leg Methods: Explain/Verbal Responses: State content correctly Electronic Signature(s) Signed: 01/31/2016 4:23:15 PM By: Montey Hora Entered By: Montey Hora on 01/31/2016 15:56:16 Apuzzo, Patrese [name] [name] ([id number]) -------------------------------------------------------------------------------- Wound Assessment Details Patient Name: Carrie Mulligan C. Date of Service: 01/31/2016 3:00 PM Medical Record Patient Account Number: [id number] Number: Treating RN: Montey Hora 1935-01-18 (80 y.o. Other Clinician: Date of Birth/Sex: Female) Treating [name], [name] Primary Care Physician/Extender: Claudette Laws Physician: Referring Physician: [name] in Treatment: 10 Wound Status Wound Number: 1 Primary Lymphedema Etiology: Wound Location: Left, Medial Lower Leg Wound Status: Healed - Epithelialized Wounding Event: Gradually Appeared Comorbid Cataracts, Arrhythmia, Hypotension, Date Acquired: 07/04/2015 History: Osteoarthritis Weeks Of Treatment: 10 Clustered Wound: No Photos Wound Measurements Length: (cm) Width: (cm) Depth: (cm) Area: (cm) Volume: (cm) 0 % Reduction in Area: 100% 0 % Reduction in Volume: 100% 0 Epithelialization: Large (67-100%) 0 Tunneling: No 0 Undermining: No Wound Description Classification: Partial Thickness Wound Margin: Distinct, outline attached Exudate Amount: None Present Foul Odor After Cleansing: No Wound Bed Granulation Amount: Medium (34-66%) Exposed Structure Granulation Quality: Red Fascia Exposed: No Necrotic  Amount: None Present (0%) Fat Layer Exposed: No Tendon Exposed: No Muscle Exposed: No Mccard, Raeshawn C. ([id number]) Joint Exposed: No Bone Exposed: No Limited to Skin Breakdown Periwound Skin Texture Texture Color No Abnormalities Noted: No No Abnormalities Noted: No Callus: No Atrophie Blanche: No Crepitus: No Cyanosis: No Excoriation: No Ecchymosis: No Fluctuance: No Erythema: No Friable: No Hemosiderin Staining: No Induration: No Mottled: No Localized Edema: No Pallor: No Rash: No Rubor: No Scarring: No Temperature / Pain Moisture Temperature:  No Abnormality No Abnormalities Noted: No Tenderness on Palpation: Yes Dry / Scaly: No Maceration: No Moist: Yes Wound Preparation Ulcer Cleansing: Rinsed/Irrigated with Saline Topical Anesthetic Applied: Other: lidocaine 4%, Electronic Signature(s) Signed: 01/31/2016 4:23:15 PM By: Montey Hora Entered By: Montey Hora on 01/31/2016 15:52:33 Eberlin, [name] ([id number]) -------------------------------------------------------------------------------- Wound Assessment Details Patient Name: [name], Gary C. Date of Service: 01/31/2016 3:00 PM Medical Record Patient Account Number: [id number] Number: Treating RN: Montey Hora March 27, 1935 (80 y.o. Other Clinician: Date of Birth/Sex: Female) Treating [name], [name] Primary Care Physician/Extender: Claudette Laws Physician: Referring Physician: [name] in Treatment: 10 Wound Status Wound Number: 2 Primary Lymphedema Etiology: Wound Location: Right Lower Leg - Lateral Wound Status: Open Wounding Event: Gradually Appeared Comorbid Cataracts, Arrhythmia, Hypotension, Date Acquired: 07/04/2015 History: Osteoarthritis Weeks Of Treatment: 10 Clustered Wound: No Photos Wound Measurements Length: (cm) 1.4 % Reduction i Width: (cm) 0.8 % Reduction i Depth: (cm) 0.3 Epithelializa Area: (cm) 0.88 Tunneling: Volume: (cm)  0.264 Undermining: n Area: 37.8% n Volume: -87.2% tion: None No No Wound Description Classification: Partial Thickness Wound Margin: Distinct, outline attached Exudate Amount: Small Exudate Type: Serosanguineous Exudate Color: red, brown Foul Odor After Cleansing: No Wound Bed Granulation Amount: Medium (34-66%) Exposed Structure Granulation Quality: Pink Fascia Exposed: No Necrotic Amount: Medium (34-66%) Fat Layer Exposed: No Nunley, Laiana C. ([id number]) Necrotic Quality: Adherent Slough Tendon Exposed: No Muscle Exposed: No Joint Exposed: No Bone Exposed: No Limited to Skin Breakdown Periwound Skin Texture Texture Color No Abnormalities Noted: No No Abnormalities Noted: No Callus: No Atrophie Blanche: No Crepitus: No Cyanosis: No Excoriation: No Ecchymosis: No Fluctuance: No Erythema: No Friable: No Hemosiderin Staining: No Induration: No Mottled: No Localized Edema: Yes Pallor: No Rash: No Rubor: No Scarring: No Temperature / Pain Moisture Temperature: No Abnormality No Abnormalities Noted: No Tenderness on Palpation: Yes Dry / Scaly: No Maceration: No Moist: Yes Wound Preparation Ulcer Cleansing: Rinsed/Irrigated with Saline Topical Anesthetic Applied: Other: lidocaine 4%, Treatment Notes Wound #2 (Right, Lateral Lower Leg) 1. Cleansed with: Cleanse wound with antibacterial soap and water 2. Anesthetic Topical Lidocaine 4% cream to wound bed prior to debridement 4. Dressing Applied: Promogran 5. Secondary Dressing Applied ABD Pad 7. Secured with 4-Layer Compression System - Right Lower Extremity Electronic Signature(s) Signed: 01/31/2016 4:23:15 PM By: Montey Hora Entered By: Montey Hora on 01/31/2016 15:52:15 Clavel, Tamieka [name] [name] ([id number]) -------------------------------------------------------------------------------- Wound Assessment Details Patient Name: [name], Orel C. Date of Service: 01/31/2016 3:00 PM Medical  Record Patient Account Number: [id number] Number: Treating RN: Montey Hora 08-Feb-1935 (80 y.o. Other Clinician: Date of Birth/Sex: Female) Treating [name], [name] Primary Care Physician/Extender: Claudette Laws Physician: Referring Physician: [name] in Treatment: 10 Wound Status Wound Number: 3 Primary Trauma, Other Etiology: Wound Location: Left Lower Leg - Anterior, Proximal Wound Status: Healed - Epithelialized Wounding Event: Trauma Comorbid Cataracts, Arrhythmia, Hypotension, History: Osteoarthritis Date Acquired: 12/18/2015 Weeks Of Treatment: 6 Clustered Wound: No Photos Wound Measurements Length: (cm) 0 % Reduction Width: (cm) 0 % Reduction Depth: (cm) 0 Epithelializ Area: (cm) 0 Tunneling: Volume: (cm) 0 Undermining in Area: 100% in Volume: 100% ation: Large (67-100%) No : No Wound Description Classification: Partial Thickness Wound Margin: Flat and Intact Exudate Amount: None Present Foul Odor After Cleansing: No Wound Bed Granulation Amount: None Present (0%) Exposed Structure Necrotic Amount: None Present (0%) Fascia Exposed: No Fat Layer Exposed: No Tendon Exposed: No Cessna, Shaaron C. ([id number]) Muscle Exposed: No Joint Exposed: No Bone Exposed: No Limited to Skin  Breakdown Periwound Skin Texture Texture Color No Abnormalities Noted: No No Abnormalities Noted: No Localized Edema: Yes Temperature / Pain Moisture Temperature: No Abnormality No Abnormalities Noted: No Tenderness on Palpation: Yes Moist: No Wound Preparation Ulcer Cleansing: Rinsed/Irrigated with Saline Topical Anesthetic Applied: None, Other: lidocaine 4%, Electronic Signature(s) Signed: 01/31/2016 4:23:15 PM By: Montey Hora Entered By: Montey Hora on 01/31/2016 15:53:01 [place]ola, [name] ([id number]) -------------------------------------------------------------------------------- Vitals Details Patient Name: Carrie Mulligan C.  Date of Service: 01/31/2016 3:00 PM Medical Record Patient Account Number: [id number] Number: Treating RN: Montey Hora Sep 28, 1934 (80 y.o. Other Clinician: Date of Birth/Sex: Female) Treating [name], [name] Primary Care Physician/Extender: Claudette Laws Physician: Referring Physician: [name] in Treatment: 10 Vital Signs Time Taken: 15:29 Temperature (F): 97.7 Height (in): 68 Pulse (bpm): 51 Weight (lbs): 178 Respiratory Rate (breaths/min): 18 Body Mass Index (BMI): 27.1 Blood Pressure (mmHg): 132/57 Reference Range: 80 - 120 mg / dl Electronic Signature(s) Signed: 01/31/2016 4:23:15 PM By: Montey Hora Entered By: Montey Hora on 01/31/2016 15:29:34

## 2016-02-05 ENCOUNTER — Ambulatory Visit: Payer: Medicare Other

## 2016-02-05 ENCOUNTER — Encounter: Payer: Medicare Other | Admitting: *Deleted

## 2016-02-05 DIAGNOSIS — Z952 Presence of prosthetic heart valve: Secondary | ICD-10-CM

## 2016-02-05 DIAGNOSIS — I5042 Chronic combined systolic (congestive) and diastolic (congestive) heart failure: Secondary | ICD-10-CM | POA: Diagnosis not present

## 2016-02-05 DIAGNOSIS — Z9889 Other specified postprocedural states: Secondary | ICD-10-CM

## 2016-02-05 NOTE — Progress Notes (Signed)
Daily Session Note  Patient Details  Name: [name] MRN: [id number] Date of Birth: July 27, 1934 Referring Provider:    Encounter Date: 02/05/2016  Check In:     Session Check In - 02/05/16 1749      Check-In   Location [facility]   Staff Present Earlean Shawl, BS, ACSM CEP, Exercise Physiologist;Amanda Oletta Darter, BA, ACSM CEP, Exercise Physiologist;Diane Joya Gaskins, RN, BSN   Supervising physician immediately available to respond to emergencies See telemetry face sheet for immediately available ER MD   Medication changes reported     No   Fall or balance concerns reported    No   Warm-up and Cool-down Performed on first and last piece of equipment   Resistance Training Performed Yes   VAD Patient? No     VAD patient   Has back up controller? No     Pain Assessment   Currently in Pain? No/denies   Multiple Pain Sites No         Goals Met:  Independence with exercise equipment Exercise tolerated well No report of cardiac concerns or symptoms Strength training completed today  Goals Unmet:  Not Applicable  Comments: Pt able to follow exercise prescription today without complaint.  Will continue to monitor for progression.    Dr. [name] is Medical Director for [facility].

## 2016-02-06 ENCOUNTER — Encounter: Payer: Medicare Other | Admitting: Surgery

## 2016-02-06 DIAGNOSIS — I5042 Chronic combined systolic (congestive) and diastolic (congestive) heart failure: Secondary | ICD-10-CM | POA: Diagnosis not present

## 2016-02-06 NOTE — Progress Notes (Signed)
DOMITILA, SAILER ([id number]) Visit Report for 02/06/2016 Chief Complaint Document Details Patient Name: [name], [name]. Date of Service: 02/06/2016 3:45 PM Medical Record Number: [id number] Patient Account Number: [id number] Date of Birth/Sex: April 14, 1935 (80 y.o. Female) Treating RN: [name] Primary Care Physician: [name] Other Clinician: Referring Physician: [name] Treating Physician/Extender: Frann Rider in Treatment: 10 Information Obtained from: Patient Chief Complaint Patient is here for review of bilateral lower extremity wounds dating back to December 2016 Electronic Signature(s) Signed: 02/06/2016 4:13:40 PM By: Christin Fudge MD, FACS Entered By: Christin Fudge on 02/06/2016 16:13:40 Flippen, [name] ([id number]) -------------------------------------------------------------------------------- HPI Details Patient Name: [name] Date of Service: 02/06/2016 3:45 PM Medical Record Number: [id number] Patient Account Number: [id number] Date of Birth/Sex: 04/10/1935 (80 y.o. Female) Treating RN: [name] Primary Care Physician: [name] Other Clinician: Referring Physician: [name] Treating Physician/Extender: Frann Rider in Treatment: 10 History of Present Illness HPI Description: 11/22/15; this is Phon is a 81 year old woman who lives at home on her own. According to the patient and her daughter was present she has had long-standing edema in her legs dating back many years. She also has a history of chronic systolic heart failure, atrial fibrillation and is status post mitral valve replacement. Last echocardiogram I see in [facility] showed an ejection fraction of 40-45% she is on Lasix 60 mg a day and spironolactone 25 mg a day. Her current problem began in December around Christmas time she developed a small hematoma in the medial part of her left leg which rapidly expanded to a very large hematoma  that required surgical debridement. This situation was complicated by the fact that the patient is on long-standing Coumadin for mechanical heart valve. She went to the OR had this evacuated on January 4 /17. The wound has gradually improved however she has developed a small wounds around this area and more recently a wound on the right lateral leg. She is weeping edema fluid. The patient is already been to see vascular surgery. It was recommended that she wear Unna boots, she did not tolerate this due to pain in the left leg. She was then prescribed Juzo stockings and really can't get these on herself although truthfully there is probably too much edema for a Juzo stockings currently. She is not a diabetic and has no history of PAD or claudication that I could elicit. She does not use the external compression pumps reliably. She comes today with notes from her primary physician and Dr. Ronalee Belts both recommending various forms of compression but the patient does not really complied with them. Has been using the external compression pumps with some regularity but certainly not daily on the right leg and this has helped. I also note that her daughter tells me the history that she does not sleep in bed at home. She has a hospital bed but with her legs up she finds this painful so she sleeps in the couch sitting up with her legs dependent. 11/29/15; the patient is arrives today accompanied by her son. He expresses satisfaction that she is maintained the compression all week. 12/06/15; the patient has kept her Profore light wraps on, we have good edema control no major change in the wounds we have been using Aquacel 12/13/15; changed to RTD last week. One of the 3 wounds on her left medial leg is healed she has 2 remaining wounds here and one on the right lateral leg. 12/18/2015 -- the patient was at Dr. Nino Parsley  office today and he was seeing her for an arterial study. While the wrap was being removed  she had an inadvertent laceration of the left proximal anterior leg which bled quite profusely and a compression dressing was applied over this and the patient was here to get her Profore wraps done. I was asked to see the patient to make an assessment and treat appropriately. 12/26/15; the patient's injury on the left proximal leg and Steri-Strips removed after soaking. There is an open area here. The original wounds to still open on the right lateral and left medial leg. 01/03/16 patient's injury on her proximal left leg looks quite good. Still small open area on the medial left leg which appears to be improving. The area on the right lateral leg still substantially open with no real improvement in wound depth. Her edema control is marginal with a Profore light. We have been using RTD for 3-4 weeks without any major change 01/10/16: wounds without s/s of infection. vascular results are pending regarding arterial studies. 01/17/16; patient comes in today complaining of severe pain however I think most of this is in the right hip not related to her wounds. She continues with a oval-shaped wound on the right lateral leg, trauma to the left Elamin, Shakea C. ([id number]) anterior leg just below her tibial plateau. She has a smaller eschar on the left anterior leg. She is being using Prisma however she informs [place] today that she is actually allergic to silver, nose this from a previous application at [facility] some years ago 01/24/16; edema is not so well controlled today, I think I reduced her to [place] bilaterally last week. The area which was a scissor injury on her left upper anterior lower leg is fully epithelialized. She only has a small open area remaining on the medial aspect of her left leg. The oval-shaped wound on the right mid lateral leg may be a bit smaller. Debrided of surface slough nonviable subcutaneous tissue. I had changed to collagen 2 weeks ago in an attempt to get this to  close 01/31/16 all the patient's wounds on her left leg give healed. We have good edema control with bilateral Profore lites which she has been compliant with. She still has the oval-shaped wound on the right lateral calf allergic even this appears to be slowly improving. The patient has juxtalite stockings at home. She states she thinks she can put these on. She also has external compression pumps at home although I think her compliance with this has not been good in the past. She complains today of edema in her thighs. Tells me she takes 40 mg of Lasix daily Electronic Signature(s) Signed: 02/06/2016 4:13:53 PM By: Christin Fudge MD, FACS Entered By: Christin Fudge on 02/06/2016 16:13:53 Antonini, [name] ([id number]) -------------------------------------------------------------------------------- Physical Exam Details Patient Name: [name] Date of Service: 02/06/2016 3:45 PM Medical Record Number: [id number] Patient Account Number: [id number] Date of Birth/Sex: Feb 17, 1935 (80 y.o. Female) Treating RN: [name] Primary Care Physician: [name] Other Clinician: Referring Physician: [name] Treating Physician/Extender: Frann Rider in Treatment: 10 Constitutional . Pulse regular. Respirations normal and unlabored. Afebrile. . Eyes Nonicteric. Reactive to light. Ears, Nose, Mouth, and Throat Lips, teeth, and gums WNL.Marland Kitchen Moist mucosa without lesions. Neck supple and nontender. No palpable supraclavicular or cervical adenopathy. Normal sized without goiter. Respiratory WNL. No retractions.. Breath sounds WNL, No rubs, rales, rhonchi, or wheeze.. Cardiovascular Heart rhythm and rate regular, no murmur or gallop.. Pedal Pulses WNL. in  stage II lymphedema both lower extremities. Chest Breasts symmetical and no nipple discharge.. Breast tissue WNL, no masses, lumps, or tenderness.. Lymphatic No adneopathy. No adenopathy. No  adenopathy. Musculoskeletal Adexa without tenderness or enlargement.. Digits and nails w/o clubbing, cyanosis, infection, petechiae, ischemia, or inflammatory conditions.. Integumentary (Hair, Skin) No suspicious lesions. No crepitus or fluctuance. No peri-wound warmth or erythema. No masses.Marland Kitchen Psychiatric Judgement and insight Intact.. No evidence of depression, anxiety, or agitation.. Notes right lower extremity continues to have the wound on the lateral calf which has some subcutaneous debris which I was able to wash out with moist saline gauze. Electronic Signature(s) Signed: 02/06/2016 4:14:34 PM By: Christin Fudge MD, FACS Entered By: Christin Fudge on 02/06/2016 16:14:33 [place], [name] ([id number]) -------------------------------------------------------------------------------- Physician Orders Details Patient Name: [name] Date of Service: 02/06/2016 3:45 PM Medical Record Number: [id number] Patient Account Number: [id number] Date of Birth/Sex: 08-23-1934 (80 y.o. Female) Treating RN: Montey Hora Primary Care Physician: [name] Other Clinician: Referring Physician: [name] Treating Physician/Extender: Frann Rider in Treatment: 10 Verbal / Phone Orders: Yes Clinician: Montey Hora Read Back and Verified: Yes Diagnosis Coding Wound Cleansing Wound #2 Right,Lateral Lower Leg o Cleanse wound with mild soap and water Anesthetic Wound #2 Right,Lateral Lower Leg o Topical Lidocaine 4% cream applied to wound bed prior to debridement Primary Wound Dressing Wound #2 Right,Lateral Lower Leg o Promogran Secondary Dressing Wound #2 Right,Lateral Lower Leg o ABD pad Dressing Change Frequency Wound #2 Right,Lateral Lower Leg o Change dressing every week Follow-up Appointments Wound #2 Right,Lateral Lower Leg o Return Appointment in 1 week. Edema Control Wound #2 Right,Lateral Lower Leg o 4-Layer Compression System - Right  Lower Extremity o Patient to wear own Juxtalite/Juzo compression garment. - left leg Electronic Signature(s) Signed: 02/06/2016 4:16:34 PM By: Christin Fudge MD, FACS Signed: 02/06/2016 4:35:40 PM By: Montey Hora Entered By: Montey Hora on 02/06/2016 16:09:32 Carbon, PAYSEN HAMILTON ([id number]) AVENELL, FOOSE ([id number]) -------------------------------------------------------------------------------- Problem List Details Patient Name: LOWTHER, Sherra C. Date of Service: 02/06/2016 3:45 PM Medical Record Number: [id number] Patient Account Number: [id number] Date of Birth/Sex: [birth year] (80 y.o. Female) Treating RN: Carolyne Fiscal, Debi Primary Care Physician: [name] Other Clinician: Referring Physician: [name] Treating Physician/Extender: Frann Rider in Treatment: 10 Active Problems ICD-10 Encounter Code Description Active Date Diagnosis I87.333 Chronic venous hypertension (idiopathic) with ulcer and 11/22/2015 Yes inflammation of bilateral lower extremity I89.0 Lymphedema, not elsewhere classified 11/22/2015 Yes [id number] Chronic systolic (congestive) heart failure 11/22/2015 Yes S81.812A Laceration without foreign body, left lower leg, initial 12/18/2015 Yes encounter Inactive Problems Resolved Problems Electronic Signature(s) Signed: 02/06/2016 4:13:28 PM By: Christin Fudge MD, FACS Entered By: Christin Fudge on 02/06/2016 16:13:28 Ozimek, [name] ([id number]) -------------------------------------------------------------------------------- Progress Note Details Patient Name: [name] Date of Service: 02/06/2016 3:45 PM Medical Record Number: [id number] Patient Account Number: [id number] Date of Birth/Sex: 02/13/1935 (80 y.o. Female) Treating RN: [name] Primary Care Physician: [name] Other Clinician: Referring Physician: [name] Treating Physician/Extender: Frann Rider in Treatment: 10 Subjective Chief  Complaint Information obtained from Patient Patient is here for review of bilateral lower extremity wounds dating back to December 2016 History of Present Illness (HPI) 11/22/15; this is Gotto is a 81 year old woman who lives at home on her own. According to the patient and her daughter was present she has had long-standing edema in her legs dating back many years. She also has a history of chronic systolic heart failure, atrial fibrillation and is status post mitral valve replacement.  Last echocardiogram I see in [facility] showed an ejection fraction of 40-45% she is on Lasix 60 mg a day and spironolactone 25 mg a day. Her current problem began in December around Christmas time she developed a small hematoma in the medial part of her left leg which rapidly expanded to a very large hematoma that required surgical debridement. This situation was complicated by the fact that the patient is on long-standing Coumadin for mechanical heart valve. She went to the OR had this evacuated on January 4 /17. The wound has gradually improved however she has developed a small wounds around this area and more recently a wound on the right lateral leg. She is weeping edema fluid. The patient is already been to see vascular surgery. It was recommended that she wear Unna boots, she did not tolerate this due to pain in the left leg. She was then prescribed Juzo stockings and really can't get these on herself although truthfully there is probably too much edema for a Juzo stockings currently. She is not a diabetic and has no history of PAD or claudication that I could elicit. She does not use the external compression pumps reliably. She comes today with notes from her primary physician and Dr. Ronalee Belts both recommending various forms of compression but the patient does not really complied with them. Has been using the external compression pumps with some regularity but certainly not daily on the right leg and  this has helped. I also note that her daughter tells me the history that she does not sleep in bed at home. She has a hospital bed but with her legs up she finds this painful so she sleeps in the couch sitting up with her legs dependent. 11/29/15; the patient is arrives today accompanied by her son. He expresses satisfaction that she is maintained the compression all week. 12/06/15; the patient has kept her Profore light wraps on, we have good edema control no major change in the wounds we have been using Aquacel 12/13/15; changed to RTD last week. One of the 3 wounds on her left medial leg is healed she has 2 remaining wounds here and one on the right lateral leg. 12/18/2015 -- the patient was at Dr. Nino Parsley office today and he was seeing her for an arterial study. While the wrap was being removed she had an inadvertent laceration of the left proximal anterior leg which bled quite profusely and a compression dressing was applied over this and the patient was here to get her Profore wraps done. I was asked to see the patient to make an assessment and treat appropriately. 12/26/15; the patient's injury on the left proximal leg and Steri-Strips removed after soaking. There is an open area here. The original wounds to still open on the right lateral and left medial leg. 01/03/16 patient's injury on her proximal left leg looks quite good. Still small open area on the medial left leg which appears to be improving. The area on the right lateral leg still substantially open with no real Conran, Kodie C. ([id number]) improvement in wound depth. Her edema control is marginal with a Profore light. We have been using RTD for 3-4 weeks without any major change 01/10/16: wounds without s/s of infection. vascular results are pending regarding arterial studies. 01/17/16; patient comes in today complaining of severe pain however I think most of this is in the right hip not related to her wounds. She continues with  a oval-shaped wound on the right lateral leg, trauma  to the left anterior leg just below her tibial plateau. She has a smaller eschar on the left anterior leg. She is being using Prisma however she informs [place] today that she is actually allergic to silver, nose this from a previous application at [facility] some years ago 01/24/16; edema is not so well controlled today, I think I reduced her to [place] bilaterally last week. The area which was a scissor injury on her left upper anterior lower leg is fully epithelialized. She only has a small open area remaining on the medial aspect of her left leg. The oval-shaped wound on the right mid lateral leg may be a bit smaller. Debrided of surface slough nonviable subcutaneous tissue. I had changed to collagen 2 weeks ago in an attempt to get this to close 01/31/16 all the patient's wounds on her left leg give healed. We have good edema control with bilateral Profore lites which she has been compliant with. She still has the oval-shaped wound on the right lateral calf allergic even this appears to be slowly improving. The patient has juxtalite stockings at home. She states she thinks she can put these on. She also has external compression pumps at home although I think her compliance with this has not been good in the past. She complains today of edema in her thighs. Tells me she takes 40 mg of Lasix daily Objective Constitutional Pulse regular. Respirations normal and unlabored. Afebrile. Vitals Time Taken: 3:51 PM, Height: 68 in, Weight: 178 lbs, BMI: 27.1, Temperature: 97.4 F, Pulse: 53 bpm, Respiratory Rate: 18 breaths/min, Blood Pressure: 126/43 mmHg. Eyes Nonicteric. Reactive to light. Ears, Nose, Mouth, and Throat Lips, teeth, and gums WNL.Marland Kitchen Moist mucosa without lesions. Neck supple and nontender. No palpable supraclavicular or cervical adenopathy. Normal sized without goiter. Respiratory WNL. No retractions.. Breath sounds WNL, No rubs,  rales, rhonchi, or wheeze.. Cardiovascular Heart rhythm and rate regular, no murmur or gallop.. Pedal Pulses WNL. in stage II lymphedema both lower extremities. MAZZY, WHITEFOOT ([id number]) Chest Breasts symmetical and no nipple discharge.. Breast tissue WNL, no masses, lumps, or tenderness.. Lymphatic No adneopathy. No adenopathy. No adenopathy. Musculoskeletal Adexa without tenderness or enlargement.. Digits and nails w/o clubbing, cyanosis, infection, petechiae, ischemia, or inflammatory conditions.Marland Kitchen Psychiatric Judgement and insight Intact.. No evidence of depression, anxiety, or agitation.. General Notes: right lower extremity continues to have the wound on the lateral calf which has some subcutaneous debris which I was able to wash out with moist saline gauze. Integumentary (Hair, Skin) No suspicious lesions. No crepitus or fluctuance. No peri-wound warmth or erythema. No masses.. Wound #2 status is Open. Original cause of wound was Gradually Appeared. The wound is located on the Right,Lateral Lower Leg. The wound measures 1.6cm length x 0.6cm width x 0.3cm depth; 0.754cm^2 area and 0.226cm^3 volume. The wound is limited to skin breakdown. There is no tunneling or undermining noted. There is a large amount of serosanguineous drainage noted. The wound margin is distinct with the outline attached to the wound base. There is medium (34-66%) pink granulation within the wound bed. There is a medium (34-66%) amount of necrotic tissue within the wound bed including Adherent Slough. The periwound skin appearance exhibited: Localized Edema, Moist. The periwound skin appearance did not exhibit: Callus, Crepitus, Excoriation, Fluctuance, Friable, Induration, Rash, Scarring, Dry/Scaly, Maceration, Atrophie Blanche, Cyanosis, Ecchymosis, Hemosiderin Staining, Mottled, Pallor, Rubor, Erythema. Periwound temperature was noted as No Abnormality. The periwound has tenderness  on palpation. Assessment Active Problems ICD-10 I87.333 - Chronic venous hypertension (  idiopathic) with ulcer and inflammation of bilateral lower extremity I89.0 - Lymphedema, not elsewhere classified [id number] - Chronic systolic (congestive) heart failure [id number] - Laceration without foreign body, left lower leg, initial encounter Kandler, Arminta C. ([id number]) She has not been compliant wearing her left lower extremity juxta lites and I have urged her to do so in a proper fashion and discussed this in detail. She is also encouraged to use her lymphedema pumps twice a day for an hour each. We will use Promogran for her wound and use a 4-layer compression wrap. Plan Wound Cleansing: Wound #2 Right,Lateral Lower Leg: Cleanse wound with mild soap and water Anesthetic: Wound #2 Right,Lateral Lower Leg: Topical Lidocaine 4% cream applied to wound bed prior to debridement Primary Wound Dressing: Wound #2 Right,Lateral Lower Leg: Promogran Secondary Dressing: Wound #2 Right,Lateral Lower Leg: ABD pad Dressing Change Frequency: Wound #2 Right,Lateral Lower Leg: Change dressing every week Follow-up Appointments: Wound #2 Right,Lateral Lower Leg: Return Appointment in 1 week. Edema Control: Wound #2 Right,Lateral Lower Leg: 4-Layer Compression System - Right Lower Extremity Patient to wear own Juxtalite/Juzo compression garment. - left leg She has not been compliant wearing her left lower extremity juxta lites and I have urged her to do so in a proper fashion and discussed this in detail. She is also encouraged to use her lymphedema pumps twice a day for an hour each. We will use Promogran for her wound and use a 4-layer compression wrap. Electronic Signature(s) Signed: 02/06/2016 4:15:55 PM By: Christin Fudge MD, FACS [place], [place] ([id number]) Entered By: Christin Fudge on 02/06/2016 16:15:55 Cast[place], [name]  ([id number]) -------------------------------------------------------------------------------- SuperBill Details Patient Name: [name] Date of Service: 02/06/2016 Medical Record Number: [id number] Patient Account Number: [id number] Date of Birth/Sex: 13-Nov-1934 (81 y.o. Female) Treating RN: Montey Hora Primary Care Physician: [name] Other Clinician: Referring Physician: [name] Treating Physician/Extender: Frann Rider in Treatment: 10 Diagnosis Coding ICD-10 Codes Code Description Chronic venous hypertension (idiopathic) with ulcer and inflammation of bilateral lower I87.333 extremity I89.0 Lymphedema, not elsewhere classified [id number] Chronic systolic (congestive) heart failure [id number] Laceration without foreign body, left lower leg, initial encounter Facility Procedures CPT4: Description Modifier Quantity Code [id number] (Facility Use Only) [phone] - [place] RT 1 LEG Physician Procedures CPT4: Description Modifier Quantity Code [id number] - WC PHYS LEVEL 3 - EST PT 1 ICD-10 Description Diagnosis I87.333 Chronic venous hypertension (idiopathic) with ulcer and inflammation of bilateral lower extremity I89.0 Lymphedema, not elsewhere  classified [id number] Chronic systolic (congestive) heart failure Electronic Signature(s) Signed: 02/06/2016 4:16:14 PM By: Christin Fudge MD, FACS Entered By: Christin Fudge on 02/06/2016 16:16:13

## 2016-02-06 NOTE — Progress Notes (Signed)
RENASIA, LUETKEMEYER ([id number]) Visit Report for 02/06/2016 Arrival Information Details Patient Name: [name], [name]. Date of Service: 02/06/2016 3:45 PM Medical Record Number: [id number] Patient Account Number: [id number] Date of Birth/Sex: 1934-07-29 (81 y.o. Female) Treating RN: [name] Primary Care Physician: [name] Other Clinician: Referring Physician: [name] Treating Physician/Extender: Frann Rider in Treatment: 10 Visit Information History Since Last Visit All ordered tests and consults were completed: No Patient Arrived: [name] Added or deleted any medications: No Arrival Time: 15:48 Any new allergies or adverse reactions: No Accompanied By: daughter Had a fall or experienced change in No Transfer Assistance: None activities of daily living that may affect Patient Identification Verified: Yes risk of falls: Secondary Verification Process Yes Signs or symptoms of abuse/neglect since last No Completed: visito Patient Requires Transmission- No Hospitalized since last visit: No Based Precautions: Pain Present Now: No Patient Has Alerts: Yes Patient Alerts: Patient on Blood Thinner [facility] Signature(s) Signed: 02/06/2016 4:37:03 PM By: [name] Entered By: [name] on 02/06/2016 15:51:08 [name], [name] ([id number]) -------------------------------------------------------------------------------- Encounter Discharge Information Details Patient Name: Carrie Mulligan C. Date of Service: 02/06/2016 3:45 PM Medical Record Number: [id number] Patient Account Number: [id number] Date of Birth/Sex: 1935-05-26 (81 y.o. Female) Treating RN: [name] Primary Care Physician: [name] Other Clinician: Referring Physician: [name] Treating Physician/Extender: Frann Rider in Treatment: 10 Encounter Discharge Information Items Discharge Pain Level: 0 Discharge Condition:  Stable Ambulatory Status: Cane Discharge Destination: Home Transportation: Private Auto Accompanied By: daughter Schedule Follow-up Appointment: Yes Medication Reconciliation completed Yes and provided to Patient/Care [name]: Provided on Clinical Summary of Care: 02/06/2016 Form Type Recipient Paper Patient [place] Electronic Signature(s) Signed: 02/06/2016 4:25:25 PM By: Ruthine Dose Entered By: Ruthine Dose on 02/06/2016 16:25:24 [name], [name] ([id number]) -------------------------------------------------------------------------------- Lower Extremity Assessment Details Patient Name: Carrie Mulligan C. Date of Service: 02/06/2016 3:45 PM Medical Record Number: [id number] Patient Account Number: [id number] Date of Birth/Sex: 07-Jul-1934 (81 y.o. Female) Treating RN: [name] Primary Care Physician: [name] Other Clinician: Referring Physician: [name] Treating Physician/Extender: Frann Rider in Treatment: 10 Edema Assessment Assessed: Shirlyn Goltz: No] [Right: No] E[Left: dema] [Right: :] Calf Left: Right: Point of Measurement: 36 cm From Medial Instep cm 34.2 cm Ankle Left: Right: Point of Measurement: 10 cm From Medial Instep cm 20.6 cm Vascular Assessment Pulses: Posterior Tibial Dorsalis Pedis Palpable: [Right:Yes] Extremity colors, hair growth, and conditions: Extremity Color: [Right:Hyperpigmented] Temperature of Extremity: [Right:Warm] Capillary Refill: [Right:< 3 seconds] Toe Nail Assessment Left: Right: Thick: No Discolored: No Deformed: No Improper Length and Hygiene: No Electronic Signature(s) Signed: 02/06/2016 4:37:03 PM By: [name] Entered By: [name] on 02/06/2016 16:01:12 [name], [name] ([id number]) -------------------------------------------------------------------------------- Multi Wound Chart Details Patient Name: Carrie Mulligan C. Date of Service: 02/06/2016 3:45 PM Medical Record Number:  [id number] Patient Account Number: [id number] Date of Birth/Sex: 1934-10-10 (81 y.o. Female) Treating RN: [name] Primary Care Physician: [name] Other Clinician: Referring Physician: [name] Treating Physician/Extender: Frann Rider in Treatment: 10 Vital Signs Height(in): 68 Pulse(bpm): 53 Weight(lbs): 178 Blood Pressure 126/43 (mmHg): Body Mass Index(BMI): 27 Temperature(F): 97.4 Respiratory Rate 18 (breaths/min): Photos: [2:No Photos] [N/A:N/A] Wound Location: [2:Right Lower Leg - Lateral] [N/A:N/A] Wounding Event: [2:Gradually Appeared] [N/A:N/A] Primary Etiology: [2:Lymphedema] [N/A:N/A] Comorbid History: [2:Cataracts, Arrhythmia, Hypotension, Osteoarthritis] [N/A:N/A] Date Acquired: [2:07/04/2015] [N/A:N/A] Weeks of Treatment: [2:10] [N/A:N/A] Wound Status: [2:Open] [N/A:N/A] Measurements L x W x D 1.6x0.6x0.3 [N/A:N/A] (cm) Area (cm) : [2:0.754] [N/A:N/A] Volume (cm) : [2:0.226] [N/A:N/A] %  Reduction in Area: [2:46.70%] [N/A:N/A] % Reduction in Volume: -60.30% [N/A:N/A] Classification: [2:Partial Thickness] [N/A:N/A] Exudate Amount: [2:Large] [N/A:N/A] Exudate Type: [2:Serosanguineous] [N/A:N/A] Exudate Color: [2:red, brown] [N/A:N/A] Wound Margin: [2:Distinct, outline attached] [N/A:N/A] Granulation Amount: [2:Medium (34-66%)] [N/A:N/A] Granulation Quality: [2:Pink] [N/A:N/A] Necrotic Amount: [2:Medium (34-66%)] [N/A:N/A] Exposed Structures: [2:Fascia: No Fat: No Tendon: No Muscle: No Joint: No Bone: No] [N/A:N/A] Limited to Skin Breakdown Epithelialization: None N/A N/A Periwound Skin Texture: Edema: Yes N/A N/A Excoriation: No Induration: No Callus: No Crepitus: No Fluctuance: No Friable: No Rash: No Scarring: No Periwound Skin Moist: Yes N/A N/A Moisture: Maceration: No Dry/Scaly: No Periwound Skin Color: Atrophie Blanche: No N/A N/A Cyanosis: No Ecchymosis: No Erythema: No Hemosiderin Staining: No Mottled:  No Pallor: No Rubor: No Temperature: No Abnormality N/A N/A Tenderness on Yes N/A N/A Palpation: Wound Preparation: Ulcer Cleansing: N/A N/A Rinsed/Irrigated with Saline Topical Anesthetic Applied: Other: lidocaine 4% Treatment Notes Electronic Signature(s) Signed: 02/06/2016 4:37:03 PM By: [name] Entered By: [name] on 02/06/2016 16:03:10 [name], [name] ([id number]) -------------------------------------------------------------------------------- Multi-Disciplinary Care Plan Details Patient Name: [name]. Date of Service: 02/06/2016 3:45 PM Medical Record Number: [id number] Patient Account Number: [id number] Date of Birth/Sex: Nov 13, 1934 (81 y.o. Female) Treating RN: Carolyne Fiscal, Debi Primary Care Physician: [name] Other Clinician: Referring Physician: [name] Treating Physician/Extender: Frann Rider in Treatment: 10 Active Inactive Orientation to the Wound Care Program Nursing Diagnoses: Knowledge deficit related to the wound healing center program Goals: Patient/caregiver will verbalize understanding of the [place] Program Date Initiated: 11/22/2015 Goal Status: Active Interventions: Provide education on orientation to the wound center Notes: Venous Leg Ulcer Nursing Diagnoses: Knowledge deficit related to disease process and management Potential for venous Insuffiency (use before diagnosis confirmed) Goals: Patient will maintain optimal edema control Date Initiated: 11/22/2015 Goal Status: Active Patient/caregiver will verbalize understanding of disease process and disease management Date Initiated: 11/22/2015 Goal Status: Active Verify adequate tissue perfusion prior to therapeutic compression application Date Initiated: 11/22/2015 Goal Status: Active Interventions: Assess peripheral edema status every visit. Compression as ordered Provide education on venous insufficiency [name], [name]  ([id number]) Treatment Activities: Non-invasive vascular studies : 11/22/2015 Therapeutic compression applied : 11/22/2015 Notes: Wound/Skin Impairment Nursing Diagnoses: Impaired tissue integrity Knowledge deficit related to ulceration/compromised skin integrity Goals: Patient/caregiver will verbalize understanding of skin care regimen Date Initiated: 11/22/2015 Goal Status: Active Ulcer/skin breakdown will have a volume reduction of 30% by week 4 Date Initiated: 11/22/2015 Goal Status: Active Ulcer/skin breakdown will have a volume reduction of 50% by week 8 Date Initiated: 11/22/2015 Goal Status: Active Ulcer/skin breakdown will have a volume reduction of 80% by week 12 Date Initiated: 11/22/2015 Goal Status: Active Ulcer/skin breakdown will heal within 14 weeks Date Initiated: 11/22/2015 Goal Status: Active Interventions: Assess patient/caregiver ability to perform ulcer/skin care regimen upon admission and as needed Assess ulceration(s) every visit Provide education on ulcer and skin care Treatment Activities: Skin care regimen initiated : 11/22/2015 Topical wound management initiated : 11/22/2015 Notes: Electronic Signature(s) Signed: 02/06/2016 4:37:03 PM By: [name] Entered By: [name] on 02/06/2016 16:03:01 [name], Katurah C. ([id number]) Mcnabb, Johnita C. ([id number]) -------------------------------------------------------------------------------- Pain Assessment Details Patient Name: Carrie Mulligan C. Date of Service: 02/06/2016 3:45 PM Medical Record Number: [id number] Patient Account Number: [id number] Date of Birth/Sex: [birth year] (81 y.o. Female) Treating RN: [name] Primary Care Physician: [name] Other Clinician: Referring Physician: [name] Treating Physician/Extender: Frann Rider in Treatment: 10 Active Problems Location of Pain Severity and Description of Pain Patient Has  Paino No Site Locations With Dressing  Change: No Pain Management and Medication Current Pain Management: Electronic Signature(s) Signed: 02/06/2016 4:37:03 PM By: [name] Entered By: [name] on 02/06/2016 15:51:19 [name], [name] ([id number]) -------------------------------------------------------------------------------- Patient/Caregiver Education Details Patient Name: [name]. Date of Service: 02/06/2016 3:45 PM Medical Record Number: [id number] Patient Account Number: [id number] Date of Birth/Gender: 03-Jun-1935 (81 y.o. Female) Treating RN: [name] Primary Care Physician: [name] Other Clinician: Referring Physician: [name] Treating Physician/Extender: Frann Rider in Treatment: 10 Education Assessment Education Provided To: Patient Education Topics Provided Wound/Skin Impairment: Handouts: Other: do not get dressing wet Methods: Demonstration, Explain/Verbal Responses: State content correctly Electronic Signature(s) Signed: 02/06/2016 4:37:03 PM By: [name] Entered By: [name] on 02/06/2016 16:04:39 [name], [name] ([id number]) -------------------------------------------------------------------------------- Wound Assessment Details Patient Name: Carrie Mulligan C. Date of Service: 02/06/2016 3:45 PM Medical Record Number: [id number] Patient Account Number: [id number] Date of Birth/Sex: [birth year] (81 y.o. Female) Treating RN: Carolyne Fiscal, Debi Primary Care Physician: [name] Other Clinician: Referring Physician: [name] Treating Physician/Extender: Frann Rider in Treatment: 10 Wound Status Wound Number: 2 Primary Lymphedema Etiology: Wound Location: Right Lower Leg - Lateral Wound Status: Open Wounding Event: Gradually Appeared Comorbid Cataracts, Arrhythmia, Hypotension, Date Acquired: 07/04/2015 History: Osteoarthritis Weeks Of Treatment: 10 Clustered Wound: No Photos Photo Uploaded By: [name]  on 02/06/2016 16:35:31 Wound Measurements Length: (cm) 1.6 Width: (cm) 0.6 Depth: (cm) 0.3 Area: (cm) 0.754 Volume: (cm) 0.226 % Reduction in Area: 46.7% % Reduction in Volume: -60.3% Epithelialization: None Tunneling: No Undermining: No Wound Description Classification: Partial Thickness Wound Margin: Distinct, outline attached Exudate Amount: Large Exudate Type: Serosanguineous Exudate Color: red, brown Foul Odor After Cleansing: No Wound Bed Granulation Amount: Medium (34-66%) Exposed Structure Granulation Quality: Pink Fascia Exposed: No Necrotic Amount: Medium (34-66%) Fat Layer Exposed: No Necrotic Quality: Adherent Slough Tendon Exposed: No Latella, Marvene C. ([id number]) Muscle Exposed: No Joint Exposed: No Bone Exposed: No Limited to Skin Breakdown Periwound Skin Texture Texture Color No Abnormalities Noted: No No Abnormalities Noted: No Callus: No Atrophie Blanche: No Crepitus: No Cyanosis: No Excoriation: No Ecchymosis: No Fluctuance: No Erythema: No Friable: No Hemosiderin Staining: No Induration: No Mottled: No Localized Edema: Yes Pallor: No Rash: No Rubor: No Scarring: No Temperature / Pain Moisture Temperature: No Abnormality No Abnormalities Noted: No Tenderness on Palpation: Yes Dry / Scaly: No Maceration: No Moist: Yes Wound Preparation Ulcer Cleansing: Rinsed/Irrigated with Saline Topical Anesthetic Applied: Other: lidocaine 4%, Treatment Notes Wound #2 (Right, Lateral Lower Leg) 1. Cleansed with: Cleanse wound with antibacterial soap and water 3. Peri-wound Care: Moisturizing lotion 4. Dressing Applied: Promogran 5. Secondary Dressing Applied ABD Pad Dry Gauze 7. Secured with Tape 4-Layer Compression System - Right Lower Extremity Notes unna to anchor Electronic Signature(s) Signed: 02/06/2016 4:37:03 PM By: De Burrs, [name] ([id number]) Entered By: [name] on 02/06/2016  16:02:13 Frenz, [name] ([id number]) -------------------------------------------------------------------------------- Vitals Details Patient Name: Carrie Mulligan C. Date of Service: 02/06/2016 3:45 PM Medical Record Number: [id number] Patient Account Number: [id number] Date of Birth/Sex: [birth year] (81 y.o. Female) Treating RN: Carolyne Fiscal, Debi Primary Care Physician: [name] Other Clinician: Referring Physician: [name] Treating Physician/Extender: Frann Rider in Treatment: 10 Vital Signs Time Taken: 15:51 Temperature (F): 97.4 Height (in): 68 Pulse (bpm): 53 Weight (lbs): 178 Respiratory Rate (breaths/min): 18 Body Mass Index (BMI): 27.1 Blood Pressure (mmHg): 126/43 Reference Range: 80 - 120 mg / dl Electronic Signature(s) Signed: 02/06/2016 4:37:03 PM By: [name] Entered  By: [name] on 02/06/2016 15:51:42

## 2016-02-07 ENCOUNTER — Encounter: Payer: Medicare Other | Admitting: *Deleted

## 2016-02-07 ENCOUNTER — Ambulatory Visit: Payer: Medicare Other

## 2016-02-07 ENCOUNTER — Ambulatory Visit: Payer: Medicare Other | Admitting: Surgery

## 2016-02-07 ENCOUNTER — Encounter: Payer: Self-pay | Admitting: *Deleted

## 2016-02-07 DIAGNOSIS — I5042 Chronic combined systolic (congestive) and diastolic (congestive) heart failure: Secondary | ICD-10-CM | POA: Diagnosis not present

## 2016-02-07 DIAGNOSIS — Z952 Presence of prosthetic heart valve: Secondary | ICD-10-CM

## 2016-02-07 NOTE — Progress Notes (Signed)
Daily Session Note  Patient Details  Name: LEILANEE RIGHETTI MRN: [id number] Date of Birth: 05-21-1935 Referring Provider:    Encounter Date: 02/07/2016  Check In:     Session Check In - 02/07/16 1632      Check-In   Location [facility]   Staff Present Heath Lark, RN, BSN, Lance Sell, BA, ACSM CEP, Exercise Physiologist;Diane Joya Gaskins, RN, BSN   Supervising physician immediately available to respond to emergencies See telemetry face sheet for immediately available ER MD   Medication changes reported     No   Fall or balance concerns reported    No   Warm-up and Cool-down Performed on first and last piece of equipment   Resistance Training Performed Yes   VAD Patient? No     VAD patient   Has back up controller? No     Pain Assessment   Currently in Pain? No/denies         Goals Met:  Exercise tolerated well No report of cardiac concerns or symptoms Strength training completed today  Goals Unmet:  Not Applicable  Comments:  Pt able to follow exercise prescription today without complaint.  Will continue to monitor for progression.    Dr. [name] is Medical Director for [place] and [facility].

## 2016-02-08 ENCOUNTER — Ambulatory Visit: Payer: Medicare Other

## 2016-02-08 ENCOUNTER — Encounter: Payer: Medicare Other | Admitting: *Deleted

## 2016-02-08 DIAGNOSIS — I5042 Chronic combined systolic (congestive) and diastolic (congestive) heart failure: Secondary | ICD-10-CM

## 2016-02-08 DIAGNOSIS — Z952 Presence of prosthetic heart valve: Secondary | ICD-10-CM

## 2016-02-08 NOTE — Progress Notes (Signed)
Daily Session Note  Patient Details  Name: [name] MRN: [id number] Date of Birth: 1935/01/25 Referring Provider:    Encounter Date: 02/08/2016  Check In:     Session Check In - 02/08/16 1802      Check-In   Location [facility]   Staff Present Heath Lark, RN, BSN, Lance Sell, BA, ACSM CEP, Exercise Physiologist;Diane Joya Gaskins, RN, BSN   Supervising physician immediately available to respond to emergencies See telemetry face sheet for immediately available ER MD   Medication changes reported     Yes   Comments [name] reported seeing Dr. Sabra Heck last week for check-up.  Dr. Sabra Heck instructed her to take Lasix 60 mg daily and to take Zaroxolyn 2.5 mg if needed (when gaining 3 pounds or more) and to take it every other day until weight back to baseline.     Warm-up and Cool-down Performed on first and last piece of equipment   Resistance Training Performed Yes   VAD Patient? No     VAD patient   Has back up controller? No     Pain Assessment   Currently in Pain? No/denies         Goals Met:  Independence with exercise equipment Exercise tolerated well No report of cardiac concerns or symptoms Strength training completed today  Goals Unmet:  Not Applicable  Comments:  Pt able to follow exercise prescription today without complaint.  Will continue to monitor for progression.    Dr. [name] is Medical Director for [place] and [facility].

## 2016-02-12 ENCOUNTER — Encounter: Payer: Medicare Other | Admitting: *Deleted

## 2016-02-12 ENCOUNTER — Ambulatory Visit: Payer: Medicare Other

## 2016-02-12 DIAGNOSIS — I5042 Chronic combined systolic (congestive) and diastolic (congestive) heart failure: Secondary | ICD-10-CM

## 2016-02-12 DIAGNOSIS — Z952 Presence of prosthetic heart valve: Secondary | ICD-10-CM

## 2016-02-12 DIAGNOSIS — Z9889 Other specified postprocedural states: Secondary | ICD-10-CM

## 2016-02-12 NOTE — Progress Notes (Signed)
Daily Session Note  Patient Details  Name: ADRIANNAH STEINKAMP MRN: [id number] Date of Birth: 1935/03/16 Referring Provider:    Encounter Date: 02/12/2016  Check In:     Session Check In - 02/12/16 1622      Check-In   Location [facility]   Staff Present Earlean Shawl, BS, ACSM CEP, Exercise Physiologist;Amanda Oletta Darter, BA, ACSM CEP, Exercise Physiologist;Diane Joya Gaskins, RN, BSN   Supervising physician immediately available to respond to emergencies See telemetry face sheet for immediately available ER MD   Medication changes reported     No   Fall or balance concerns reported    No   Warm-up and Cool-down Performed on first and last piece of equipment   Resistance Training Performed Yes   VAD Patient? No     VAD patient   Has back up controller? No     Pain Assessment   Currently in Pain? No/denies   Multiple Pain Sites No         Goals Met:  Independence with exercise equipment Exercise tolerated well No report of cardiac concerns or symptoms Strength training completed today  Goals Unmet:  Not Applicable  Comments: Pt able to follow exercise prescription today without complaint.  Will continue to monitor for progression.    Dr. [name] is Medical Director for [place] and [facility].

## 2016-02-14 ENCOUNTER — Encounter: Payer: Medicare Other | Admitting: Surgery

## 2016-02-14 ENCOUNTER — Ambulatory Visit: Payer: Medicare Other

## 2016-02-14 ENCOUNTER — Encounter: Payer: Self-pay | Admitting: *Deleted

## 2016-02-14 ENCOUNTER — Encounter: Payer: Medicare Other | Admitting: *Deleted

## 2016-02-14 DIAGNOSIS — I5042 Chronic combined systolic (congestive) and diastolic (congestive) heart failure: Secondary | ICD-10-CM | POA: Diagnosis not present

## 2016-02-14 DIAGNOSIS — Z952 Presence of prosthetic heart valve: Secondary | ICD-10-CM

## 2016-02-14 NOTE — Progress Notes (Signed)
Cardiac Individual Treatment Plan  Patient Details  Name: [name] MRN: [id number] Date of Birth: Jun 16, 1935 Referring Provider:    Initial Encounter Date:  Flowsheet Row Cardiac Rehab from 09/25/2015 in [facility] Cardiac and Pulmonary Rehab  Date  09/25/15      Visit Diagnosis: S/P MVR (mitral valve replacement)  Chronic combined systolic and diastolic congestive heart failure ([place])  Patient's Home Medications on Admission:  Current Outpatient Prescriptions:  .  acetaminophen (TYLENOL) 500 MG tablet, Take 500 mg by mouth every 6 (six) hours as needed for mild pain, moderate pain, fever or headache., Disp: , Rfl:  .  amiodarone (PACERONE) 200 MG tablet, Take 200 mg by mouth daily., Disp: , Rfl:  .  aspirin EC 81 MG tablet, Take 81 mg by mouth daily. , Disp: , Rfl:  .  furosemide (LASIX) 40 MG tablet, Take 60 mg by mouth daily., Disp: , Rfl:  .  gabapentin (NEURONTIN) 100 MG capsule, Take 1 capsule (100 mg total) by mouth at bedtime., Disp: 90 capsule, Rfl: 0 .  metoprolol succinate (TOPROL-XL) 25 MG 24 hr tablet, Take 1 tablet (25 mg total) by mouth daily., Disp: 30 tablet, Rfl: 2 .  Multiple Vitamin (MULTI-VITAMINS) TABS, Take 1 tablet by mouth daily. , Disp: , Rfl:  .  pantoprazole (PROTONIX) 40 MG tablet, Take 40 mg by mouth daily. , Disp: , Rfl:  .  potassium chloride (K-DUR) 10 MEQ tablet, Take by mouth., Disp: , Rfl:  .  spironolactone (ALDACTONE) 25 MG tablet, Take 25 mg by mouth daily., Disp: , Rfl:  .  traMADol (ULTRAM) 50 MG tablet, TAKE 1/2 TO 1 TABLET BY MOUTH TWICE A DAY AS NEEDED FOR PAIN, Disp: , Rfl: 5 .  vitamin C (ASCORBIC ACID) 500 MG tablet, Take 500 mg by mouth daily as needed (for cold symptoms). , Disp: , Rfl:  .  warfarin (COUMADIN) 5 MG tablet, Take 1 tablet (5 mg total) by mouth daily., Disp: 30 tablet, Rfl: 2  Past Medical History: Past Medical History:  Diagnosis Date  . Arthritis   . Chronic atrial fibrillation ([place])    a. On Coumadin  .  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 EF 25% by echo in 08/2015 b. RHC in 08/2015 showed normal filling pressures  . GERD (gastroesophageal reflux disease)   . Hypertension   . S/P MVR (mitral valve replacement)    a. MVR 1994 b. redo MVR in 08/2014 - on Coumadin  . Shortness of breath dyspnea     Tobacco Use: History  Smoking Status  . Former Smoker  Smokeless Tobacco  . Never Used    Labs: Recent Review Flowsheet Data    There is no flowsheet data to display.       Exercise Target Goals:    Exercise Program Goal: Individual exercise prescription set with THRR, safety & activity barriers. Participant demonstrates ability to understand and report RPE using BORG scale, to self-measure pulse accurately, and to acknowledge the importance of the exercise prescription.  Exercise Prescription Goal: Starting with aerobic activity 30 plus minutes a day, 3 days per week for initial exercise prescription. Provide home exercise prescription and guidelines that participant acknowledges understanding prior to discharge.  Activity Barriers & Risk Stratification:     Activity Barriers & Cardiac Risk Stratification - 09/25/15 1531      Activity Barriers & Cardiac Risk Stratification   Activity Barriers Arthritis;Joint Problems   Cardiac Risk Stratification  High      6 Minute Walk:     6 Minute Walk    Row Name 09/25/15 1509         6 Minute Walk   Phase Initial  NuStep Test      Distance 413 feet  Nu Step Test Level 1 with arms     Walk Time 6 minutes     # of Rest Breaks 0     RPE 13     Symptoms No     Resting HR 111 bpm     Resting BP 132/60     Max Ex. HR 116 bpm     Max Ex. BP 138/54        Initial Exercise Prescription:     Initial Exercise Prescription - 09/25/15 1500      Date of Initial Exercise RX and Referring Provider   Date 09/25/15     Treadmill   MPH 1  Use caution with treadmill and  small time intervals   Grade 0   Minutes 3     Recumbant Bike   Level 2   RPM 40   Watts 15   Minutes 10     NuStep   Level 2   Watts 20   Minutes 15     Arm Ergometer   Level 1   Watts 10   Minutes 15     Arm/Foot Ergometer   Level 4   Watts 12   Minutes 15     Recumbant Elliptical   Level 1   RPM 35   Watts 10   Minutes 10     REL-XR   Level 2   Watts 30   Minutes 15     T5 Nustep   Level 2   Watts 15   Minutes 15     Biostep-RELP   Level 2   Watts 15   Minutes 15     Prescription Details   Frequency (times per week) 3   Duration Progress to 30 minutes of continuous aerobic without signs/symptoms of physical distress     Intensity   THRR REST +  30   Ratings of Perceived Exertion 11-15     Progression   Progression Continue progressive overload as per policy without signs/symptoms or physical distress.     Resistance Training   Training Prescription Yes   Weight 2   Reps 10-15      Perform Capillary Blood Glucose checks as needed.  Exercise Prescription Changes:     Exercise Prescription Changes    Row Name 12/29/15 1000 01/11/16 1200 01/25/16 1400 02/09/16 1000       Exercise Review   Progression  - Yes Yes Yes      Response to Exercise   Blood Pressure (Admit) 114/56 142/80 122/60 130/70    Blood Pressure (Exercise) 102/50 112/52 128/70 154/70    Blood Pressure (Exit) 122/64 116/60 132/80 134/70    Heart Rate (Admit) 58 bpm 58 bpm 59 bpm 64 bpm    Heart Rate (Exercise) 60 bpm 78 bpm 58 bpm 80 bpm    Heart Rate (Exit) 66 bpm 54 bpm 44 bpm 62 bpm    Rating of Perceived Exertion (Exercise) 13 13 13 15     Duration Progress to 30 minutes of continuous aerobic without signs/symptoms of physical distress Progress to 45 minutes of aerobic exercise without signs/symptoms of physical distress Progress to 45 minutes of aerobic exercise without signs/symptoms of physical distress Progress  to 45 minutes of aerobic exercise without  signs/symptoms of physical distress    Intensity THRR unchanged  - THRR unchanged THRR unchanged      Progression   Progression Continue to progress workloads to maintain intensity without signs/symptoms of physical distress. Continue to progress workloads to maintain intensity without signs/symptoms of physical distress.  - Continue to progress workloads to maintain intensity without signs/symptoms of physical distress.      Resistance Training   Training Prescription Yes Yes Yes Yes    Weight 2 1 1 1     Reps 10-15 10-15 10-15 10-15      Interval Training   Interval Training  -  -  - No      Treadmill   MPH 1  -  -  -    Grade 0  -  -  -    Minutes 5  -  -  -      NuStep   Level 4 4 4 5     Watts  - 24  -  -    Minutes 25 35  - 35       Exercise Comments:     Exercise Comments    Row Name 10/20/15 1158 11/16/15 1540 12/29/15 1020 01/11/16 1245 01/22/16 1743   Exercise Comments [name] has not attended Heart Track since 09-27-15. Goal will be to resume program when able. [name] has not attended since 3-29. [name] has returned after a 3 month absence.  Her exercise prescription was reviewed as well as equipment safety. Margaet is progresing well with exercise duration and has added TM for short periods. [name] has had hip pain while standing. Avoided the TM today due to this pain but tolerated the NS machine well with no pain during exercise. She is working with her doctor reguarding her hip pain.    [name] Name 01/25/16 1444 01/25/16 1643 01/25/16 1720       Exercise Comments [name] continues to do well with exercise. [name] said she wished they didn't rewrap her leg yesterday since it was uncomfortable when they rewrapped it. [name] is doing ok on the Nustep with her legs today. I will fax heart rhythm strips to Dr. [name] after this class. Heart rate 44-58 at times even exercising. No c/o except today a little tired since she was awake in the middle of the night.          Discharge Exercise Prescription (Final Exercise Prescription Changes):     Exercise Prescription Changes - 02/09/16 1000      Exercise Review   Progression Yes     Response to Exercise   Blood Pressure (Admit) 130/70   Blood Pressure (Exercise) 154/70   Blood Pressure (Exit) 134/70   Heart Rate (Admit) 64 bpm   Heart Rate (Exercise) 80 bpm   Heart Rate (Exit) 62 bpm   Rating of Perceived Exertion (Exercise) 15   Duration Progress to 45 minutes of aerobic exercise without signs/symptoms of physical distress   Intensity THRR unchanged     Progression   Progression Continue to progress workloads to maintain intensity without signs/symptoms of physical distress.     Resistance Training   Training Prescription Yes   Weight 1   Reps 10-15     Interval Training   Interval Training No     NuStep   Level 5   Minutes 35      Nutrition:  Target Goals: Understanding of nutrition guidelines, daily intake of sodium <1560m,  cholesterol <243m, calories 30% from fat and 7% or less from saturated fats, daily to have 5 or more servings of fruits and vegetables.  Biometrics:     Pre Biometrics - 09/25/15 1508      Pre Biometrics   Height 5' 5.5" (1.664 m)   Weight 186 lb 1.6 oz (84.4 kg)   Waist Circumference 34 inches   Hip Circumference 42 inches   Waist to Hip Ratio 0.81 %   BMI (Calculated) 30.6       Nutrition Therapy Plan and Nutrition Goals:     Nutrition Therapy & Goals - 10/04/15 1707      Personal Nutrition Goals   Comments Carrie. Mulford prefers not to meet with RD       Nutrition Discharge: Rate Your Plate Scores:     Nutrition Assessments - 09/28/15 1421      Rate Your Plate Scores   Pre Score 65   Pre Score % 72 %      Nutrition Goals Re-Evaluation:   Psychosocial: Target Goals: Acknowledge presence or absence of depression, maximize coping skills, provide positive support system. Participant is able to verbalize types and ability to  use techniques and skills needed for reducing stress and depression.  Initial Review & Psychosocial Screening:     Initial Psych Review & Screening - 09/25/15 [place] Yes      Quality of Life Scores:     Quality of Life - 09/25/15 1541      Quality of Life Scores   Health/Function Pre 19.5 %   Socioeconomic Pre 26.75 %   Psych/Spiritual Pre 20.64 %   Family Pre 24.88 %   GLOBAL Pre 21.59 %      PHQ-9: Recent Review Flowsheet Data    [facility] 09/25/2015 01/05/2015   Decreased Interest 0 0   Down, Depressed, Hopeless 0 0   PHQ - 2 Score 0 0   Altered sleeping 3 1   Tired, decreased energy 3 1   Change in appetite 0 0   Feeling bad or failure about yourself  0 0   Trouble concentrating 0 0   Moving slowly or fidgety/restless 0 0   Suicidal thoughts 0 0   PHQ-9 Score 6 2   Difficult doing work/chores Somewhat difficult -      Psychosocial Evaluation and Intervention:     Psychosocial Evaluation - 12/27/15 1714      Psychosocial Evaluation & Interventions   Comments Counselor met with Carrie. SBinafor a new psychosocial evaluation.  Carrie S was in this same program a little over a year ago and is now back due to some recent health issues.  She continues to have a strong support system with a daughter living in the same home and a [name] that is close by and very involved.  Carrie. SKlapperreports that she has had to contend a great deal with swelling in her body and was in the hospital for this in March.  She has subsequently had surgery on her leg to remove a growth there and is going to the wound center often for treatment of that.  Carrie. SChauncey Cruelreports she is sleeping better lately and has a good appetite.  She denies a history of depression or anxiety, but admits she does have some stress in her life with her adult children arguing quite often over her care.  Her goals for this program  are to get the swelling down so the wounds  will heal better.  Counselor will follow with Carrie. S as needed.        Psychosocial Re-Evaluation:     Psychosocial Re-Evaluation    Row Name 09/28/15 1422 12/11/15 1323 01/11/16 1227 02/07/16 1719       Psychosocial Re-Evaluation   Comments When I checked EPIC to chart Rate your plate, I noticed Kennady Zimmerle is in the [place] today.  [name]'s [name] called to say [name] fell and broker her tail bone and is very sore. I called and spoke to Mrs. Derderian and  she said she is sorry she can't attend Cardiac Rehab. but she is sore and on pain medicine. I tried to call her [name] back at [phone] but his vm was full.  [name] [name] left a vm that she is sorry that she has not been able to attend Cardiac Rehab due to several infections.  Counselor follow up with Carrie. Kimmey today reporting she is feeling better overall and her swelling has reduced some since coming to this class.  She stated ongoing treatment at the wound center has helped and she is feeling stronger while working out in this program.  Carrie. Chauncey Cruel reports the stress is less with her adult children arguing over her care now; or she has just learned to "stop worrying about it!"  counselor commended Carrie. Wilcher for her hard work and commitment to consistent exercise.      Continued Psychosocial Services Needed Yes  -  -  -       Vocational Rehabilitation: Provide vocational rehab assistance to qualifying candidates.   Vocational Rehab Evaluation & Intervention:     Vocational Rehab - 09/25/15 1532      Initial Vocational Rehab Evaluation & Intervention   Assessment shows need for Vocational Rehabilitation No      Education: Education Goals: Education classes will be provided on a weekly basis, covering required topics. Participant will state understanding/return demonstration of topics presented.  Learning Barriers/Preferences:   Education Topics: General Nutrition Guidelines/Fats and Fiber: -Group instruction  provided by verbal, written material, models and posters to present the general guidelines for heart healthy nutrition. Gives an explanation and review of dietary fats and fiber. Flowsheet Row Cardiac Rehab from 02/12/2016 in [facility] Cardiac and Pulmonary Rehab  Date  01/22/16  Educator  PI  Instruction Review Code  2- meets goals/outcomes      Controlling Sodium/Reading Food Labels: -Group verbal and written material supporting the discussion of sodium use in heart healthy nutrition. Review and explanation with models, verbal and written materials for utilization of the food label. Flowsheet Row Cardiac Rehab from 02/12/2016 in [facility] Cardiac and Pulmonary Rehab  Date  01/29/16  Educator  PI  Instruction Review Code  2- meets goals/outcomes      Exercise Physiology & Risk Factors: - Group verbal and written instruction with models to review the exercise physiology of the cardiovascular system and associated critical values. Details cardiovascular disease risk factors and the goals associated with each risk factor. Flowsheet Row Cardiac Rehab from 02/12/2016 in [facility] Cardiac and Pulmonary Rehab  Date  02/05/16  Educator  [facility]  Instruction Review Code  2- meets goals/outcomes      Aerobic Exercise & Resistance Training: - Gives group verbal and written discussion on the health impact of inactivity. On the components of aerobic and resistive training programs and the benefits of this training and how to safely progress through these  programs. Flowsheet Row Cardiac Rehab from 02/12/2016 in [facility] Cardiac and Pulmonary Rehab  Date  02/07/16  Educator  Nada Maclachlan  Instruction Review Code  2- meets goals/outcomes      Flexibility, Balance, General Exercise Guidelines: - Provides group verbal and written instruction on the benefits of flexibility and balance training programs. Provides general exercise guidelines with specific guidelines to those with heart or lung disease. Demonstration and skill  practice provided. Flowsheet Row Cardiac Rehab from 02/12/2016 in [facility] Cardiac and Pulmonary Rehab  Date  02/12/16  Educator  AS  Instruction Review Code  2- meets goals/outcomes      Stress Management: - Provides group verbal and written instruction about the health risks of elevated stress, cause of high stress, and healthy ways to reduce stress. Flowsheet Row Cardiac Rehab from 02/12/2016 in [facility] Cardiac and Pulmonary Rehab  Date  12/27/15  Educator  [facility]  Instruction Review Code  2- meets goals/outcomes      Depression: - Provides group verbal and written instruction on the correlation between heart/lung disease and depressed mood, treatment options, and the stigmas associated with seeking treatment. Flowsheet Row Cardiac Rehab from 02/12/2016 in [facility] Cardiac and Pulmonary Rehab  Date  11/09/14  Educator  [facility]  Instruction Review Code  2- meets goals/outcomes      Anatomy & Physiology of the Heart: - Group verbal and written instruction and models provide basic cardiac anatomy and physiology, with the coronary electrical and arterial systems. Review of: AMI, Angina, Valve disease, Heart Failure, Cardiac Arrhythmia, Pacemakers, and the ICD. Flowsheet Row Cardiac Rehab from 02/12/2016 in [facility] Cardiac and Pulmonary Rehab  Date  12/25/15  Educator  DW  Instruction Review Code  2- meets goals/outcomes      Cardiac Procedures: - Group verbal and written instruction and models to describe the testing methods done to diagnose heart disease. Reviews the outcomes of the test results. Describes the treatment choices: Medical Management, Angioplasty, or Coronary Bypass Surgery. Flowsheet Row Cardiac Rehab from 02/12/2016 in [facility] Cardiac and Pulmonary Rehab  Date  11/07/14  Educator  Foster Simpson RN BSN CCR[facility]  Instruction Review Code  2- meets goals/outcomes      Cardiac Medications: - Group verbal and written instruction to review commonly prescribed medications for heart disease. Reviews the  medication, class of the drug, and side effects. Includes the steps to properly store meds and maintain the prescription regimen. Flowsheet Row Cardiac Rehab from 02/12/2016 in [facility] Cardiac and Pulmonary Rehab  Date  01/15/16 [name] 2]  Educator  DW  Instruction Review Code  2- meets goals/outcomes      Go Sex-Intimacy & Heart Disease, Get SMART - Goal Setting: - Group verbal and written instruction through game format to discuss heart disease and the return to sexual intimacy. Provides group verbal and written material to discuss and apply goal setting through the application of the S.M.A.R.T. Method. Flowsheet Row Cardiac Rehab from 02/12/2016 in [facility] Cardiac and Pulmonary Rehab  Date  11/07/14  Educator  Foster Simpson RN BSN CCR[facility]  Instruction Review Code  2- meets goals/outcomes      Other Matters of the Heart: - Provides group verbal, written materials and models to describe Heart Failure, Angina, Valve Disease, and Diabetes in the realm of heart disease. Includes description of the disease process and treatment options available to the cardiac patient. Flowsheet Row Cardiac Rehab from 02/12/2016 in [facility] Cardiac and Pulmonary Rehab  Date  12/14/14  Educator  CE  Instruction Review  Code  2- meets goals/outcomes      Exercise & Equipment Safety: - Individual verbal instruction and demonstration of equipment use and safety with use of the equipment. Flowsheet Row Cardiac Rehab from 02/12/2016 in [facility] Cardiac and Pulmonary Rehab  Date  09/25/15  Educator  C. EnterkinRN  Instruction Review Code  1- partially meets, needs review/practice      Infection Prevention: - Provides verbal and written material to individual with discussion of infection control including proper hand washing and proper equipment cleaning during exercise session. Flowsheet Row Cardiac Rehab from 02/12/2016 in [facility] Cardiac and Pulmonary Rehab  Date  09/25/15  Educator  C. EnterkinRN  Instruction Review Code  2- meets  goals/outcomes      Falls Prevention: - Provides verbal and written material to individual with discussion of falls prevention and safety. Flowsheet Row Cardiac Rehab from 02/12/2016 in [facility] Cardiac and Pulmonary Rehab  Date  09/25/15  Educator  C. [place]  Instruction Review Code  1- partially meets, needs review/practice      Diabetes: - Individual verbal and written instruction to review signs/symptoms of diabetes, desired ranges of glucose level fasting, after meals and with exercise. Advice that pre and post exercise glucose checks will be done for 3 sessions at entry of program.    Knowledge Questionnaire Score:     Knowledge Questionnaire Score - 09/25/15 1532      Knowledge Questionnaire Score   Pre Score 18      Core Components/Risk Factors/Patient Goals at Admission:     Personal Goals and Risk Factors at Admission - 09/25/15 1536      Core Components/Risk Factors/Patient Goals on Admission   Increase Strength and Stamina Yes   Intervention Provide advice, education, support and counseling about physical activity/exercise needs.;Develop an individualized exercise prescription for aerobic and resistive training based on initial evaluation findings, risk stratification, comorbidities and participant's personal goals.   Expected Outcomes Achievement of increased cardiorespiratory fitness and enhanced flexibility, muscular endurance and strength shown through measurements of functional capacity and personal statement of participant.   Heart Failure Yes   Intervention Provide a combined exercise and nutrition program that is supplemented with education, support and counseling about heart failure. Directed toward relieving symptoms such as shortness of breath, decreased exercise tolerance, and extremity edema.   Expected Outcomes Improve functional capacity of life;Short term: Attendance in program 2-3 days a week with increased exercise capacity. Reported lower sodium  intake. Reported increased fruit and vegetable intake. Reports medication compliance.;Short term: Daily weights obtained and reported for increase. Utilizing diuretic protocols set by physician.;Long term: Adoption of self-care skills and reduction of barriers for early signs and symptoms recognition and intervention leading to self-care maintenance.      Core Components/Risk Factors/Patient Goals Review:      Goals and Risk Factor Review    Row Name 10/18/15 1900 12/11/15 1322 01/15/16 1758 02/08/16 1230       Core Components/Risk Factors/Patient Goals Review   Personal Goals Review  - Sedentary Heart Failure Heart Failure    Review Was in  hospital [name]'s [name] called to say [name] fell and broker her tail bone and is very sore. She said she is sorry she can't attend Cardiac Rehab. but she is sore and on pain medicine. I tried to call her [name] back at [phone] but his vm was full.  Julitza sees Dr Sabra Heck every 1-2 weeks to monitor CHF. Juliet checks her weight daily and takes Lasix per her Drs. instructions.  Expected Outcomes  - Able to return to Cardiac Rehab eventually.  [name] will comtinue to control CHF by following Dr.s advice and using meds as directed. Hannie will keep her HF under control.       Core Components/Risk Factors/Patient Goals at Discharge (Final Review):      Goals and Risk Factor Review - 02/08/16 1230      Core Components/Risk Factors/Patient Goals Review   Personal Goals Review Heart Failure   Review [name] checks her weight daily and takes Lasix per her Drs. instructions.     Expected Outcomes [name] will keep her HF under control.      ITP Comments:     ITP Comments    Row Name 09/26/15 0825 09/28/15 1422 10/22/15 1012 11/19/15 1112 12/11/15 1318   ITP Comments 30 day review   Continue with ITP    New start to program has attended medical review orientation. When I checked EPIC to chart Rate your plate, I noticed [name]  is in the [facility] Emerg Dept. 30 Day review. Continue with ITP  Last visit March 29,2017 30 day review. Continue with ITP. Remains out after hospital admit for HF.  Waiting for MD clearance. [name] called to say [name] fell and broker her tail bone and is very sore. She said she is sorry she can't attend Cardiac Rehab. but she is sore and on pain medicine. I tried to call her [name] back at [phone] but his vm was full.    [place] Name 12/14/15 1438 12/20/15 0908 01/11/16 1226 01/17/16 0820 01/18/16 1348   ITP Comments patient has not returned to cardiac rehab - last session 09/27/15 30 day review.  Continue with ITP   Remains out, expected to return since MD has released her for exercise [name] left a vm that she is sorry that she has not been able to attend Cardiac Rehab due to several infections.  30 day review. Continue with ITP. Has returned to program 30 day review. Continue with ITP. Has returned to program   [place] Name 01/25/16 1640 02/01/16 1640 02/08/16 1805 02/14/16 0843     ITP Comments Detra said she is a little tired today since she got up in the middle of the night to add something to a bean dishe she was making overnight for a friend of mine. Meriam said she wished they didn't rewrap her leg yesterday since it was uncomfortable when they rewrapped it. Lafaye is doing ok on the Nust[place] with her legs today. Jinelle stated she felt discomfort between shoulder blades last night and went to her physician today.  EKG performed with no signs of concern.  States back is still sore. Kenni reported seeing Dr. Sabra Heck last week for check-up.  Dr. Sabra Heck instructed her to take Lasix 60 mg daily and to take Zaroxolyn 2.5 mg if needed (when gaining 3 pounds or more) and to take it every other day until weight back to baseline. 30 day review. Continue with ITP unless changes noted by Medical Director at signature of review.       Comments:

## 2016-02-14 NOTE — Progress Notes (Addendum)
[name], [name] ([id number]) Visit Report for 02/14/2016 Chief Complaint Document Details Patient Name: [name], [name] 02/14/2016 11:30 Date of Service: AM Medical Record [id number] Number: Patient Account Number: [id number] [birth year] (81 y.o. Treating RN: [name] Date of Birth/Sex: Female) Other Clinician: Primary Care Physician: [name] Treating [name] Referring Physician: Emily [name] Physician/Extender: [name] in Treatment: 12 Information Obtained from: Patient Chief Complaint Patient is here for review of bilateral lower extremity wounds dating back to December 2016 Electronic Signature(s) Signed: 02/14/2016 12:10:36 PM By: Carrie Fudge MD, FACS Previous Signature: 02/14/2016 11:56:58 AM Version By: Carrie Fudge MD, FACS Entered By: [name] on 02/14/2016 12:10:36 [name] ([id number]) -------------------------------------------------------------------------------- HPI Details Patient Name: [name], [name] 02/14/2016 11:30 Date of Service: AM Medical Record [id number] Number: Patient Account Number: [id number] October 20, 1934 (81 y.o. Treating RN: [name] Date of Birth/Sex: Female) Other Clinician: Primary Care Physician: [name] Treating [name] Referring Physician: Emily [name] Physician/Extender: [name] in Treatment: 12 History of Present Illness HPI Description: 11/22/15; this is Smoley is a 81 year old woman who lives at home on her own. According to the patient and her daughter was present she has had long-standing edema in her legs dating back many years. She also has a history of chronic systolic heart failure, atrial fibrillation and is status post mitral valve replacement. Last [facility]ogram I [facility] showed an ejection fraction of 40-45% she is on Lasix 60 mg a day and spironolactone 25 mg a day. Her current problem began in December around Christmas time she developed a small  hematoma in the medial part of her left leg which rapidly expanded to a very large hematoma that required surgical debridement. This situation was complicated by the fact that the patient is on long-standing Coumadin for mechanical heart valve. She went to the OR had this evacuated on January 4 /17. The wound has gradually improved however she has developed a small wounds around this area and more recently a wound on the right lateral leg. She is weeping edema fluid. The patient is already been to see vascular surgery. It was recommended that she wear Unna boots, she did not tolerate this due to pain in the left leg. She was then prescribed Juzo stockings and really can't get these on herself although truthfully there is probably too much edema for a Juzo stockings currently. She is not a diabetic and has no history of PAD or claudication that I could elicit. She does not use the external compression pumps reliably. She comes today with notes from her primary physician and [name] both recommending various forms of compression but the patient does not really complied with them. Has been using the external compression pumps with some regularity but certainly not daily on the right leg and this has helped. I also note that her daughter tells me the history that she does not sleep in bed at home. She has a hospital bed but with her legs up she finds this painful so she sleeps in the couch sitting up with her legs dependent. 11/29/15; the patient is arrives today accompanied by her son. He expresses satisfaction that she is maintained the compression all week. 12/06/15; the patient has kept her Profore light wraps on, we have good edema control no major change in the wounds we have been using Aquacel 12/13/15; changed to RTD last week. One of the 3 wounds on her left medial leg is healed she has 2 remaining wounds here and one on the  right lateral leg. 12/18/2015 -- the patient was at [name]  office today and he was seeing her for an arterial study. While the wrap was being removed she had an inadvertent laceration of the left proximal anterior leg which bled quite profusely and a compression dressing was applied over this and the patient was here to get her Profore wraps done. I was asked to see the patient to make an assessment and treat appropriately. 12/26/15; the patient's injury on the left proximal leg and Steri-Strips removed after soaking. There is an open area here. The original wounds to still open on the right lateral and left medial leg. 01/03/16 patient's injury on her proximal left leg looks quite good. Still small open area on the medial left leg which appears to be improving. The area on the right lateral leg still substantially open with no real improvement in wound depth. Her edema control is marginal with a Profore light. We have been using RTD for 3-4 weeks without any major change 01/10/16: wounds without s/s of infection. vascular results are pending regarding arterial studies. [name] ([id number]) 01/17/16; patient comes in today complaining of severe pain however I think most of this is in the right hip not related to her wounds. She continues with a oval-shaped wound on the right lateral leg, trauma to the left anterior leg just below her tibial plateau. She has a smaller eschar on the left anterior leg. 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 The area which was a scissor injury on her left upper anterior lower leg is fully epithelialized. She only has a small open area remaining on the medial aspect of her left leg. The oval-shaped wound on the right mid lateral leg may be a bit smaller. Debrided of surface slough nonviable  subcutaneous tissue. I had changed to collagen 2 weeks ago in an attempt to get this to close 01/31/16 all the patient's wounds on her left leg give healed. We have good edema control with bilateral Profore lites which she has been compliant with. She still has the oval-shaped wound on the right lateral calf allergic even this appears to be slowly improving. The patient has juxtalite stockings at home. She states she thinks she can put these on. She also has external compression pumps at home although I think her compliance with this has not been good in the past. She complains today of edema in her thighs. Tells me she takes 40 mg of Lasix daily Electronic Signature(s) Signed: 02/14/2016 12:10:45 PM By: Carrie Fudge MD, FACS Previous Signature: 02/14/2016 11:57:02 AM Version By: Carrie Fudge MD, FACS Entered By: [name] on 02/14/2016 12:10:45 [name] ([id number]) -------------------------------------------------------------------------------- Physical Exam Details Patient Name: ACELYNN, STUCKMAN 02/14/2016 11:30 Date of Service: AM Medical Record [id number] Number: Patient Account Number: [id number] 1934/11/03 (81 y.o. Treating RN: [name] Date of Birth/Sex: Female) Other Clinician: Primary Care Physician: [name] Treating [name] Referring Physician: Emily [name] Physician/Extender: [name] in Treatment: 12 Constitutional . Pulse regular. Respirations normal and unlabored. Afebrile. . Eyes Nonicteric. Reactive to light. Ears, Nose, Mouth, and Throat Lips, teeth, and gums WNL.Marland Kitchen Moist mucosa without lesions. Neck supple and nontender. No palpable supraclavicular or cervical adenopathy. Normal sized without goiter. Respiratory WNL. No retractions.. Cardiovascular  Pedal Pulses WNL. No clubbing, cyanosis or edema. Lymphatic No adneopathy. No adenopathy. No adenopathy. Musculoskeletal Adexa without tenderness or enlargement.. Digits and nails  w/o clubbing, cyanosis, infection, petechiae, ischemia, or inflammatory conditions.. Integumentary (Hair, Skin) No suspicious lesions. No crepitus or fluctuance. No peri-wound warmth or erythema. No masses.Marland Kitchen Psychiatric Judgement and insight Intact.. No evidence of depression, anxiety, or agitation.. Notes the wound on the right lower extremity has some depth and some slough in it but I was able to wash it out with moist saline gauze and a Q-tip. No sharp debridement was required today. Electronic Signature(s) Signed: 02/14/2016 12:11:18 PM By: Carrie Fudge MD, FACS Entered By: [name] on 02/14/2016 12:11:18 [name] ([id number]) -------------------------------------------------------------------------------- Physician Orders Details Patient Name: [name], [name] 02/14/2016 11:30 Date of Service: AM Medical Record [id number] Number: Patient Account Number: [id number] 21-Jul-1934 (81 y.o. Treating RN: [name] Date of Birth/Sex: Female) Other Clinician: Primary Care Physician: [name] Treating [name] Referring Physician: Emily [name] Physician/Extender: [name] in Treatment: 12 Verbal / Phone Orders: Yes Clinician: Montey [name] Read Back and Verified: Yes Diagnosis Coding ICD-10 Coding Code Description Chronic venous hypertension (idiopathic) with ulcer and inflammation of bilateral lower I87.333 extremity I89.0 Lymphedema, not elsewhere classified [id number] Chronic systolic (congestive) heart failure [id number] Laceration without foreign body, left lower leg, initial encounter Wound Cleansing Wound #2 Right,Lateral Lower Leg o Cleanse wound with mild soap and water Anesthetic Wound #2 Right,Lateral Lower Leg o Topical Lidocaine 4% cream applied to wound bed prior to debridement Primary Wound Dressing Wound #2 Right,Lateral Lower Leg o Promogran Secondary Dressing Wound #2 Right,Lateral Lower Leg o ABD pad Dressing Change  Frequency Wound #2 Right,Lateral Lower Leg o Change dressing every week Follow-up Appointments Wound #2 Right,Lateral Lower Leg o Return Appointment in 1 week. [name], [name] ([id number]) Edema Control Wound #2 Right,Lateral Lower Leg o 4-Layer Compression System - Right Lower Extremity o Patient to wear own Juxtalite/Juzo compression garment. - left leg Electronic Signature(s) Signed: 02/14/2016 12:13:35 PM By: Carrie Fudge MD, FACS Signed: 02/14/2016 1:49:15 PM By: [name] Entered By: [name] on 02/14/2016 12:02:10 [name] ([id number]) -------------------------------------------------------------------------------- Problem List Details Patient Name: [name], [name] 02/14/2016 11:30 Date of Service: AM Medical Record [id number] Number: Patient Account Number: [id number] 1934/08/19 (81 y.o. Treating RN: [name] Date of Birth/Sex: Female) Other Clinician: Primary Care Physician: [name] Treating [name] Referring Physician: Emily [name] Physician/Extender: [name] in Treatment: 12 Active Problems ICD-10 Encounter Code Description Active Date Diagnosis I87.333 Chronic venous hypertension (idiopathic) with ulcer and 11/22/2015 Yes inflammation of bilateral lower extremity I89.0 Lymphedema, not elsewhere classified 11/22/2015 Yes [id number] Chronic systolic (congestive) heart failure 11/22/2015 Yes S81.812A Laceration without foreign body, left lower leg, initial 12/18/2015 Yes encounter Inactive Problems Resolved Problems Electronic Signature(s) Signed: 02/14/2016 12:10:27 PM By: Carrie Fudge MD, FACS Previous Signature: 02/14/2016 11:56:49 AM Version By: Carrie Fudge MD, FACS Entered By: [name] on 02/14/2016 12:10:26 [name] ([id number]) -------------------------------------------------------------------------------- Progress Note Details Patient Name: [name], [name] 02/14/2016 11:30 Date of  Service: AM Medical Record [id number] Number: Patient Account Number: [id number] October 16, 1934 (81 y.o. Treating RN: [name] Date of Birth/Sex: Female) Other Clinician: Primary Care Physician: [name] Treating [name] Referring Physician: Emily [name] Physician/Extender: [name] in Treatment: 12 Subjective Chief Complaint Information obtained from Patient Patient is here for review of bilateral lower extremity wounds dating back to December 2016 History of Present Illness (HPI) 11/22/15; this is Balli is a 81 year old woman who lives at  home on her own. According to the patient and her daughter was present she has had long-standing edema in her legs dating back many years. She also has a history of chronic systolic heart failure, atrial fibrillation and is status post mitral valve replacement. Last echocardiogram I see in [facility] showed an ejection fraction of 40-45% she is on Lasix 60 mg a day and spironolactone 25 mg a day. Her current problem began in December around Christmas time she developed a small hematoma in the medial part of her left leg which rapidly expanded to a very large hematoma that required surgical debridement. This situation was complicated by the fact that the patient is on long-standing Coumadin for mechanical heart valve. She went to the OR had this evacuated on January 4 /17. The wound has gradually improved however she has developed a small wounds around this area and more recently a wound on the right lateral leg. She is weeping edema fluid. The patient is already been to see vascular surgery. It was recommended that she wear Unna boots, she did not tolerate this due to pain in the left leg. She was then prescribed Juzo stockings and really can't get these on herself although truthfully there is probably too much edema for a Juzo stockings currently. She is not a diabetic and has no history of PAD or claudication that I could elicit. She  does not use the external compression pumps reliably. She comes today with notes from her primary physician and [name] both recommending various forms of compression but the patient does not really complied with them. Has been using the external compression pumps with some regularity but certainly not daily on the right leg and this has helped. I also note that her daughter tells me the history that she does not sleep in bed at home. She has a hospital bed but with her legs up she finds this painful so she sleeps in the couch sitting up with her legs dependent. 11/29/15; the patient is arrives today accompanied by her son. He expresses satisfaction that she is maintained the compression all week. 12/06/15; the patient has kept her Profore light wraps on, we have good edema control no major change in the wounds we have been using Aquacel 12/13/15; changed to RTD last week. One of the 3 wounds on her left medial leg is healed she has 2 remaining wounds here and one on the right lateral leg. 12/18/2015 -- the patient was at [name] office today and he was seeing her for an arterial study. While the wrap was being removed she had an inadvertent laceration of the left proximal anterior leg which bled quite profusely and a compression dressing was applied over this and the patient was here to get her Profore wraps done. I was asked to see the patient to make an assessment and treat appropriately. 12/26/15; the patient's injury on the left proximal leg and Steri-Strips removed after soaking. There is an open area here. The original wounds to still open on the right lateral and left medial leg. [name], [name] ([id number]) 01/03/16 patient's injury on her proximal left leg looks quite good. Still small open area on the medial left leg which appears to be improving. The area on the right lateral leg still substantially open with no real improvement in wound depth. Her edema control is marginal  with a Profore light. We have been using RTD for 3-4 weeks without any major change 01/10/16: wounds without s/s of infection.  vascular results are pending regarding arterial studies. 01/17/16; patient comes in today complaining of severe pain however I think most of this is in the right hip not related to her wounds. She continues with a oval-shaped wound on the right lateral leg, trauma to the left anterior leg just below her tibial plateau. She has a smaller eschar on the left anterior leg. She is being using Prisma however she informs [place] today that she is actually allergic to silver, nose this from a previous application at [facility] some years ago 01/24/16; edema is not so well controlled today, I think I reduced her to [place] bilaterally last week. The area which was a scissor injury on her left upper anterior lower leg is fully epithelialized. She only has a small open area remaining on the medial aspect of her left leg. The oval-shaped wound on the right mid lateral leg may be a bit smaller. Debrided of surface slough nonviable subcutaneous tissue. I had changed to collagen 2 weeks ago in an attempt to get this to close 01/31/16 all the patient's wounds on her left leg give healed. We have good edema control with bilateral Profore lites which she has been compliant with. She still has the oval-shaped wound on the right lateral calf allergic even this appears to be slowly improving. The patient has juxtalite stockings at home. She states she thinks she can put these on. She also has external compression pumps at home although I think her compliance with this has not been good in the past. She complains today of edema in her thighs. Tells me she takes 40 mg of Lasix daily Objective Constitutional Pulse regular. Respirations normal and unlabored. Afebrile. Vitals Time Taken: 11:47 AM, Height: 68 in, Weight: 178 lbs, BMI: 27.1, Temperature: 97.4 F, Pulse: 50 bpm, Respiratory Rate: 18  breaths/min, Blood Pressure: 129/54 mmHg. Eyes Nonicteric. Reactive to light. Ears, Nose, Mouth, and Throat Lips, teeth, and gums WNL.Marland Kitchen Moist mucosa without lesions. Neck supple and nontender. No palpable supraclavicular or cervical adenopathy. Normal sized without goiter. Respiratory WNL. No retractions.. Cardiovascular Pedal Pulses WNL. No clubbing, cyanosis or edema. [name], [name] ([id number]) Lymphatic No adneopathy. No adenopathy. No adenopathy. Musculoskeletal Adexa without tenderness or enlargement.. Digits and nails w/o clubbing, cyanosis, infection, petechiae, ischemia, or inflammatory conditions.Marland Kitchen Psychiatric Judgement and insight Intact.. No evidence of depression, anxiety, or agitation.. General Notes: the wound on the right lower extremity has some depth and some slough in it but I was able to wash it out with moist saline gauze and a Q-tip. No sharp debridement was required today. Integumentary (Hair, Skin) No suspicious lesions. No crepitus or fluctuance. No peri-wound warmth or erythema. No masses.. Wound #2 status is Open. Original cause of wound was Gradually Appeared. The wound is located on the Right,Lateral Lower Leg. The wound measures 1.2cm length x 0.6cm width x 0.3cm depth; 0.565cm^2 area and 0.17cm^3 volume. The wound is limited to skin breakdown. There is no tunneling or undermining noted. There is a large amount of serosanguineous drainage noted. The wound margin is distinct with the outline attached to the wound base. There is large (67-100%) pink granulation within the wound bed. There is a small (1-33%) amount of necrotic tissue within the wound bed including Adherent Slough. The periwound skin appearance exhibited: Localized Edema, Moist. The periwound skin appearance did not exhibit: Callus, Crepitus, Excoriation, Fluctuance, Friable, Induration, Rash, Scarring, Dry/Scaly, Maceration, Atrophie Blanche, Cyanosis, Ecchymosis, Hemosiderin Staining,  Mottled, Pallor, Rubor, Erythema. Periwound temperature was noted as  No Abnormality. The periwound has tenderness on palpation. Assessment Active Problems ICD-10 I87.333 - Chronic venous hypertension (idiopathic) with ulcer and inflammation of bilateral lower extremity I89.0 - Lymphedema, not elsewhere classified [id number] - Chronic systolic (congestive) heart failure [id number] - Laceration without foreign body, left lower leg, initial encounter Plan Wound Cleansing: [name], [name]. ([id number]) Wound #2 Right,Lateral Lower Leg: Cleanse wound with mild soap and water Anesthetic: Wound #2 Right,Lateral Lower Leg: Topical Lidocaine 4% cream applied to wound bed prior to debridement Primary Wound Dressing: Wound #2 Right,Lateral Lower Leg: Promogran Secondary Dressing: Wound #2 Right,Lateral Lower Leg: ABD pad Dressing Change Frequency: Wound #2 Right,Lateral Lower Leg: Change dressing every week Follow-up Appointments: Wound #2 Right,Lateral Lower Leg: Return Appointment in 1 week. Edema Control: Wound #2 Right,Lateral Lower Leg: 4-Layer Compression System - Right Lower Extremity Patient to wear own Juxtalite/Juzo compression garment. - left leg We will using collagen in the wound and this week we will use a 4-layer Profore wrap. I have asked her to bring her Juzo stockings next week and if the wound looks better she may go home with her stockings. Electronic Signature(s) Signed: 02/14/2016 12:13:01 PM By: Carrie Fudge MD, FACS Entered By: [name] on 02/14/2016 12:13:01 [name], [name] ([id number]) -------------------------------------------------------------------------------- SuperBill Details Patient Name: Carrie Mulligan C. Date of Service: 02/14/2016 Medical Record Number: [id number] Patient Account Number: [id number] Date of Birth/Sex: 16-Dec-1934 (81 y.o. Female) Treating RN: [name] Primary Care Physician: [name] Other Clinician: Referring  Physician: Emily [name] Treating Physician/Extender: Frann Rider in Treatment: 12 Diagnosis Coding ICD-10 Codes Code Description Chronic venous hypertension (idiopathic) with ulcer and inflammation of bilateral lower I87.333 extremity I89.0 Lymphedema, not elsewhere classified [id number] Chronic systolic (congestive) heart failure [id number] Laceration without foreign body, left lower leg, initial encounter Facility Procedures CPT4: Description Modifier Quantity Code [id number] (Facility Use Only) [phone] - [place] RT 1 LEG Physician Procedures CPT4: Description Modifier Quantity Code [id number] - WC PHYS LEVEL 3 - EST PT 1 ICD-10 Description Diagnosis I87.333 Chronic venous hypertension (idiopathic) with ulcer and inflammation of bilateral lower extremity I89.0 Lymphedema, not elsewhere  classified [id number] Chronic systolic (congestive) heart failure [id number] Laceration without foreign body, left lower leg, initial encounter Electronic Signature(s) Signed: 02/14/2016 12:25:12 PM By: [name] Previous Signature: 02/14/2016 12:13:19 PM Version By: Carrie Fudge MD, FACS Entered By: [name] on 02/14/2016 12:25:12

## 2016-02-14 NOTE — Progress Notes (Signed)
SMITA, HAUGH ([id number]) Visit Report for 02/14/2016 Arrival Information Details Patient Name: Carrie, [name]. Date of Service: 02/14/2016 11:30 AM Medical Record Number: [id number] Patient Account Number: [id number] Date of Birth/Sex: Nov 28, 1934 (80 y.o. Female) Treating RN: Montey Hora Primary Care Physician: [name] Other Clinician: Referring Physician: [name] Treating Physician/Extender: Frann Rider in Treatment: 12 Visit Information History Since Last Visit Added or deleted any medications: No Patient Arrived: Cane Any new allergies or adverse reactions: No Arrival Time: 11:45 Had a fall or experienced change in No Accompanied By: self activities of daily living that may affect Transfer Assistance: None risk of falls: Patient Identification Verified: Yes Signs or symptoms of abuse/neglect since last No Secondary Verification Process Yes visito Completed: Hospitalized since last visit: No Patient Requires Transmission- No Pain Present Now: No Based Precautions: Patient Has Alerts: Yes Patient Alerts: Patient on Blood Thinner Trumann Signature(s) Signed: 02/14/2016 1:49:15 PM By: Montey Hora Entered By: Montey Hora on 02/14/2016 11:45:44 [place], [name] ([id number]) -------------------------------------------------------------------------------- Encounter Discharge Information Details Patient Name: Carrie Mulligan C. Date of Service: 02/14/2016 11:30 AM Medical Record Number: [id number] Patient Account Number: [id number] Date of Birth/Sex: 1934/11/07 (80 y.o. Female) Treating RN: Montey Hora Primary Care Physician: [name] Other Clinician: Referring Physician: [name] Treating Physician/Extender: Frann Rider in Treatment: 12 Encounter Discharge Information Items Discharge Pain Level: 0 Discharge Condition: Stable Ambulatory Status: Cane Discharge Destination:  Home Transportation: Private Auto Accompanied By: self Schedule Follow-up Appointment: Yes Medication Reconciliation completed No and provided to Patient/Care [name]: Provided on Clinical Summary of Care: 02/14/2016 Form Type Recipient Paper Patient [place] Electronic Signature(s) Signed: 02/14/2016 12:25:50 PM By: Montey Hora Previous Signature: 02/14/2016 12:15:15 PM Version By: Ruthine Dose Entered By: Montey Hora on 02/14/2016 12:25:50 [place], [name] ([id number]) -------------------------------------------------------------------------------- Lower Extremity Assessment Details Patient Name: [name], [name] Date of Service: 02/14/2016 11:30 AM Medical Record Number: [id number] Patient Account Number: [id number] Date of Birth/Sex: 08/07/1934 (80 y.o. Female) Treating RN: Montey Hora Primary Care Physician: [name] Other Clinician: Referring Physician: [name] Treating Physician/Extender: Frann Rider in Treatment: 12 Edema Assessment Assessed: [name]: No] [name]: No] Edema: [Left: Ye] [Right: s] Calf Left: Right: Point of Measurement: 36 cm From Medial Instep cm 32.9 cm Ankle Left: Right: Point of Measurement: 10 cm From Medial Instep cm 20.2 cm Vascular Assessment Pulses: Posterior Tibial Dorsalis Pedis Palpable: [Right:Yes] Extremity colors, hair growth, and conditions: Extremity Color: [Right:Hyperpigmented] Hair Growth on Extremity: [Right:Yes] Temperature of Extremity: [Right:Warm] Capillary Refill: [Right:< 3 seconds] Electronic Signature(s) Signed: 02/14/2016 1:49:15 PM By: Montey Hora Entered By: Montey Hora on 02/14/2016 11:54:48 [name], [name] ([id number]) -------------------------------------------------------------------------------- Multi Wound Chart Details Patient Name: Carrie Mulligan C. Date of Service: 02/14/2016 11:30 AM Medical Record Number: [id number] Patient Account Number: [id number] Date of  Birth/Sex: 02/05/1935 (80 y.o. Female) Treating RN: Montey Hora Primary Care Physician: [name] Other Clinician: Referring Physician: [name] Treating Physician/Extender: Frann Rider in Treatment: 12 Vital Signs Height(in): 68 Pulse(bpm): 50 Weight(lbs): 178 Blood Pressure 129/54 (mmHg): Body Mass Index(BMI): 27 Temperature(F): 97.4 Respiratory Rate 18 (breaths/min): Photos: [N/A:N/A] Wound Location: Right Lower Leg - Lateral N/A N/A Wounding Event: Gradually Appeared N/A N/A Primary Etiology: Lymphedema N/A N/A Comorbid History: Cataracts, Arrhythmia, N/A N/A Hypotension, Osteoarthritis Date Acquired: 07/04/2015 N/A N/A Weeks of Treatment: 12 N/A N/A Wound Status: Open N/A N/A Measurements L x W x D 1.2x0.6x0.3 N/A N/A (cm) Area (cm) : 0.565 N/A N/A Volume (cm) : 0.17 N/A N/A %  Reduction in Area: 60.00% N/A N/A % Reduction in Volume: -20.60% N/A N/A Classification: Partial Thickness N/A N/A Exudate Amount: Large N/A N/A Exudate Type: Serosanguineous N/A N/A Exudate Color: red, brown N/A N/A Wound Margin: Distinct, outline attached N/A N/A Granulation Amount: Large (67-100%) N/A N/A Granulation Quality: Pink N/A N/A Necrotic Amount: Small (1-33%) N/A N/A [name], [name] ([id number]) Exposed Structures: Fascia: No N/A N/A Fat: No Tendon: No Muscle: No Joint: No Bone: No Limited to Skin Breakdown Epithelialization: None N/A N/A Periwound Skin Texture: Edema: Yes N/A N/A Excoriation: No Induration: No Callus: No Crepitus: No Fluctuance: No Friable: No Rash: No Scarring: No Periwound Skin Moist: Yes N/A N/A Moisture: Maceration: No Dry/Scaly: No Periwound Skin Color: Atrophie Blanche: No N/A N/A Cyanosis: No Ecchymosis: No Erythema: No Hemosiderin Staining: No Mottled: No Pallor: No Rubor: No Temperature: No Abnormality N/A N/A Tenderness on Yes N/A N/A Palpation: Wound Preparation: Ulcer Cleansing: N/A  N/A Rinsed/Irrigated with Saline Topical Anesthetic Applied: Other: lidocaine 4% Treatment Notes Electronic Signature(s) Signed: 02/14/2016 1:49:15 PM By: Montey Hora Entered By: Montey Hora on 02/14/2016 12:00:29 [name] ([id number]) -------------------------------------------------------------------------------- Multi-Disciplinary Care Plan Details Patient Name: [name], MEOLA C. Date of Service: 02/14/2016 11:30 AM Medical Record Number: [id number] Patient Account Number: [id number] Date of Birth/Sex: [birth year] (80 y.o. Female) Treating RN: Montey Hora Primary Care Physician: [name] Other Clinician: Referring Physician: [name] Treating Physician/Extender: Frann Rider in Treatment: 12 Active Inactive Orientation to the Wound Care Program Nursing Diagnoses: Knowledge deficit related to the wound healing center program Goals: Patient/caregiver will verbalize understanding of the [place] Program Date Initiated: 11/22/2015 Goal Status: Active Interventions: Provide education on orientation to the wound center Notes: Venous Leg Ulcer Nursing Diagnoses: Knowledge deficit related to disease process and management Potential for venous Insuffiency (use before diagnosis confirmed) Goals: Patient will maintain optimal edema control Date Initiated: 11/22/2015 Goal Status: Active Patient/caregiver will verbalize understanding of disease process and disease management Date Initiated: 11/22/2015 Goal Status: Active Verify adequate tissue perfusion prior to therapeutic compression application Date Initiated: 11/22/2015 Goal Status: Active Interventions: Assess peripheral edema status every visit. Compression as ordered Provide education on venous insufficiency [name], [name] ([id number]) Treatment Activities: Non-invasive vascular studies : 11/22/2015 Therapeutic compression applied : 11/22/2015 Notes: Wound/Skin  Impairment Nursing Diagnoses: Impaired tissue integrity Knowledge deficit related to ulceration/compromised skin integrity Goals: Patient/caregiver will verbalize understanding of skin care regimen Date Initiated: 11/22/2015 Goal Status: Active Ulcer/skin breakdown will have a volume reduction of 30% by week 4 Date Initiated: 11/22/2015 Goal Status: Active Ulcer/skin breakdown will have a volume reduction of 50% by week 8 Date Initiated: 11/22/2015 Goal Status: Active Ulcer/skin breakdown will have a volume reduction of 80% by week 12 Date Initiated: 11/22/2015 Goal Status: Active Ulcer/skin breakdown will heal within 14 weeks Date Initiated: 11/22/2015 Goal Status: Active Interventions: Assess patient/caregiver ability to perform ulcer/skin care regimen upon admission and as needed Assess ulceration(s) every visit Provide education on ulcer and skin care Treatment Activities: Skin care regimen initiated : 11/22/2015 Topical wound management initiated : 11/22/2015 Notes: Electronic Signature(s) Signed: 02/14/2016 1:49:15 PM By: Montey Hora Entered By: Montey Hora on 02/14/2016 12:00:17 [place], [name] ([id number]) [name], [name] ([id number]) -------------------------------------------------------------------------------- Pain Assessment Details Patient Name: Carrie Mulligan C. Date of Service: 02/14/2016 11:30 AM Medical Record Number: [id number] Patient Account Number: [id number] Date of Birth/Sex: May 12, 1935 (80 y.o. Female) Treating RN: Montey Hora Primary Care Physician: [name] Other Clinician: Referring Physician: [name] Treating Physician/Extender: Frann Rider  in Treatment: 12 Active Problems Location of Pain Severity and Description of Pain Patient Has Paino No Site Locations Pain Management and Medication Current Pain Management: Notes Topical or injectable lidocaine is offered to patient for acute pain when surgical  debridement is performed. If needed, Patient is instructed to use over the counter pain medication for the following 24-48 hours after debridement. Wound care MDs do not prescribed pain medications. Patient has chronic pain or uncontrolled pain. Patient has been instructed to make an appointment with their Primary Care Physician for pain management. Electronic Signature(s) Signed: 02/14/2016 1:49:15 PM By: Montey Hora Entered By: Montey Hora on 02/14/2016 11:45:52 [place], [name] ([id number]) -------------------------------------------------------------------------------- Patient/Caregiver Education Details Patient Name: [name] Date of Service: 02/14/2016 11:30 AM Medical Record Number: [id number] Patient Account Number: [id number] Date of Birth/Gender: 20-Aug-1934 (80 y.o. Female) Treating RN: Montey Hora Primary Care Physician: [name] Other Clinician: Referring Physician: [name] Treating Physician/Extender: Frann Rider in Treatment: 12 Education Assessment Education Provided To: Patient Education Topics Provided Venous: Handouts: Other: continue using pumps Methods: Explain/Verbal Responses: State content correctly Electronic Signature(s) Signed: 02/14/2016 1:49:15 PM By: Montey Hora Entered By: Montey Hora on 02/14/2016 12:26:05 [name], [name] ([id number]) -------------------------------------------------------------------------------- Wound Assessment Details Patient Name: [name], [name] Date of Service: 02/14/2016 11:30 AM Medical Record Number: [id number] Patient Account Number: [id number] Date of Birth/Sex: 1935/06/27 (80 y.o. Female) Treating RN: Montey Hora Primary Care Physician: [name] Other Clinician: Referring Physician: [name] Treating Physician/Extender: Frann Rider in Treatment: 12 Wound Status Wound Number: 2 Primary Lymphedema Etiology: Wound Location: Right Lower Leg -  Lateral Wound Status: Open Wounding Event: Gradually Appeared Comorbid Cataracts, Arrhythmia, Hypotension, Date Acquired: 07/04/2015 History: Osteoarthritis Weeks Of Treatment: 12 Clustered Wound: No Photos Wound Measurements Length: (cm) 1.2 Width: (cm) 0.6 Depth: (cm) 0.3 Area: (cm) 0.565 Volume: (cm) 0.17 % Reduction in Area: 60% % Reduction in Volume: -20.6% Epithelialization: None Tunneling: No Undermining: No Wound Description Classification: Partial Thickness Wound Margin: Distinct, outline attached Exudate Amount: Large Exudate Type: Serosanguineous Exudate Color: red, brown Foul Odor After Cleansing: No Wound Bed Granulation Amount: Large (67-100%) Exposed Structure Granulation Quality: Pink Fascia Exposed: No Necrotic Amount: Small (1-33%) Fat Layer Exposed: No Necrotic Quality: Adherent Slough Tendon Exposed: No Muscle Exposed: No [name], [name] ([id number]) Joint Exposed: No Bone Exposed: No Limited to Skin Breakdown Periwound Skin Texture Texture Color No Abnormalities Noted: No No Abnormalities Noted: No Callus: No Atrophie Blanche: No Crepitus: No Cyanosis: No Excoriation: No Ecchymosis: No Fluctuance: No Erythema: No Friable: No Hemosiderin Staining: No Induration: No Mottled: No Localized Edema: Yes Pallor: No Rash: No Rubor: No Scarring: No Temperature / Pain Moisture Temperature: No Abnormality No Abnormalities Noted: No Tenderness on Palpation: Yes Dry / Scaly: No Maceration: No Moist: Yes Wound Preparation Ulcer Cleansing: Rinsed/Irrigated with Saline Topical Anesthetic Applied: Other: lidocaine 4%, Treatment Notes Wound #2 (Right, Lateral Lower Leg) 1. Cleansed with: Cleanse wound with antibacterial soap and water 2. Anesthetic Topical Lidocaine 4% cream to wound bed prior to debridement 4. Dressing Applied: Promogran 5. Secondary Dressing Applied ABD Pad 7. Secured with 4-Layer Compression System - Right  Lower Extremity Notes unna to anchor Electronic Signature(s) Signed: 02/14/2016 1:49:15 PM By: Montey Hora Entered By: Montey Hora on 02/14/2016 11:55:18 [name], [name] ([id number]) -------------------------------------------------------------------------------- Vitals Details Patient Name: Carrie Mulligan C. Date of Service: 02/14/2016 11:30 AM Medical Record Number: [id number] Patient Account Number: [id number] Date of Birth/Sex: June 03, 1935 (80 y.o. Female) Treating RN: Montey Hora Primary  Care Physician: [name] Other Clinician: Referring Physician: [name] Treating Physician/Extender: Frann Rider in Treatment: 12 Vital Signs Time Taken: 11:47 Temperature (F): 97.4 Height (in): 68 Pulse (bpm): 50 Weight (lbs): 178 Respiratory Rate (breaths/min): 18 Body Mass Index (BMI): 27.1 Blood Pressure (mmHg): 129/54 Reference Range: 80 - 120 mg / dl Electronic Signature(s) Signed: 02/14/2016 1:49:15 PM By: Montey Hora Entered By: Montey Hora on 02/14/2016 11:49:08

## 2016-02-14 NOTE — Progress Notes (Signed)
Daily Session Note  Patient Details  Name: LAWREN SEXSON MRN: [id number] Date of Birth: 10-28-1934 Referring Provider:    Encounter Date: 02/14/2016  Check In:     Session Check In - 02/14/16 1628      Check-In   Location [facility]   Staff Present Gerlene Burdock, RN, Vickki Hearing, BA, ACSM CEP, Exercise Physiologist;Diane Joya Gaskins, RN, BSN   Supervising physician immediately available to respond to emergencies See telemetry face sheet for immediately available ER MD   Medication changes reported     No   Fall or balance concerns reported    No   Warm-up and Cool-down Performed on first and last piece of equipment   Resistance Training Performed Yes   VAD Patient? No         Goals Met:  Proper associated with RPD/PD & O2 Sat Exercise tolerated well  Goals Unmet:  Not Applicable  Comments:     Dr. [name] is Medical Director for [facility] and [facility].

## 2016-02-15 ENCOUNTER — Ambulatory Visit: Payer: Medicare Other

## 2016-02-15 DIAGNOSIS — Z952 Presence of prosthetic heart valve: Secondary | ICD-10-CM

## 2016-02-15 DIAGNOSIS — I5042 Chronic combined systolic (congestive) and diastolic (congestive) heart failure: Secondary | ICD-10-CM

## 2016-02-15 NOTE — Progress Notes (Signed)
Daily Session Note  Patient Details  Name: [name] MRN: [id number] Date of Birth: 04-Feb-1935 Referring Provider:    Encounter Date: January 07, 202017  Check In:     Session Check In - 02/15/16 1715      Check-In   Location [facility]   Staff Present Jeanell Sparrow, DPT, Burlene Arnt, BA, ACSM CEP, Exercise Physiologist;Diane Joya Gaskins, RN, BSN   Supervising physician immediately available to respond to emergencies See telemetry face sheet for immediately available ER MD   Medication changes reported     No   Fall or balance concerns reported    No   Warm-up and Cool-down Performed on first and last piece of equipment   Resistance Training Performed Yes   VAD Patient? No     VAD patient   Has back up controller? No     Pain Assessment   Currently in Pain? No/denies   Multiple Pain Sites No         Goals Met:  Exercise tolerated well  Goals Unmet:  Not Applicable  Comments: Patient completed exercise prescription and all exercise goals during rehab session. The exercise was tolerated well and the patient is progressing in the program.    Dr. [name] is Medical Director for [facility].

## 2016-02-19 ENCOUNTER — Encounter: Payer: Self-pay | Admitting: *Deleted

## 2016-02-19 ENCOUNTER — Telehealth: Payer: Self-pay | Admitting: *Deleted

## 2016-02-19 ENCOUNTER — Ambulatory Visit: Payer: Medicare Other

## 2016-02-19 NOTE — Telephone Encounter (Signed)
[name] called and said she is sorry but she can't attend Cardiac Rehab today since she has back problems.

## 2016-02-21 ENCOUNTER — Ambulatory Visit: Payer: Medicare Other

## 2016-02-21 ENCOUNTER — Encounter: Payer: Medicare Other | Admitting: Internal Medicine

## 2016-02-21 DIAGNOSIS — I5042 Chronic combined systolic (congestive) and diastolic (congestive) heart failure: Secondary | ICD-10-CM | POA: Diagnosis not present

## 2016-02-22 ENCOUNTER — Ambulatory Visit: Payer: Medicare Other

## 2016-02-22 NOTE — Progress Notes (Signed)
LEISL, DENMAN ([id number]) Visit Report for 02/21/2016 Chief Complaint Document Details XOLA, BEITER Date of Service: 02/21/2016 10:00 AM Patient Name: C. Patient Account Number: [id number] Medical Record Treating RN: Montey Hora [id number] Number: Other Clinician: [birth year] (81 y.o. Treating [name] Date of Birth/Sex: Female) Physician/Extender: G Primary Care [name] Physician: Referring Physician: [name] in Treatment: 13 Information Obtained from: Patient Chief Complaint Patient is here for review of bilateral lower extremity wounds dating back to December 2016 Electronic Signature(s) Signed: 02/21/2016 5:34:52 PM By: [name] MD Entered By: [name] on 02/21/2016 10:44:06 Hickox, [name] ([id number]) -------------------------------------------------------------------------------- Debridement Details [name] Date of Service: 02/21/2016 10:00 AM Patient Name: C. Patient Account Number: [id number] Medical Record Treating RN: Montey Hora [id number] Number: Other Clinician: 1934-07-02 (80 y.o. Treating Jiya Kissinger, [place] Date of Birth/Sex: Female) Physician/Extender: G Primary Care [name] Physician: Referring Physician: [name] in Treatment: 13 Debridement Performed for Wound #2 Right,Lateral Lower Leg Assessment: Performed By: Physician [name], MD Debridement: Debridement Pre-procedure Yes - 10:26 Verification/Time Out Taken: Start Time: 10:27 Pain Control: Lidocaine 4% Topical Solution Level: Skin/Subcutaneous Tissue Total Area Debrided (L x 1.1 (cm) x 0.5 (cm) = 0.55 (cm) W): Tissue and other Viable, Non-Viable, Fibrin/Slough, Subcutaneous material debrided: Instrument: Curette Bleeding: Minimum Hemostasis Achieved: Pressure End Time: 10:29 Procedural Pain: 0 Post Procedural Pain: 0 Response to Treatment: Procedure was tolerated well Post Debridement Measurements  of Total Wound Length: (cm) 1.1 Width: (cm) 0.5 Depth: (cm) 0.2 Volume: (cm) 0.086 Character of Wound/Ulcer Post Requires Further Debridement Debridement: Severity of Tissue Post Debridement: Limited to breakdown of skin Post Procedure Diagnosis Same as Pre-procedure Electronic Signature(s) KASYN, APFELBAUM ([id number]) Signed: 02/21/2016 5:18:20 PM By: Montey Hora Signed: 02/21/2016 5:34:52 PM By: [name] MD Entered By: [name] on 02/21/2016 10:43:39 Caisse, [name] ([id number]) -------------------------------------------------------------------------------- HPI Details [name] Date of Service: 02/21/2016 10:00 AM Patient Name: C. Patient Account Number: [id number] Medical Record Treating RN: Montey Hora [id number] Number: Other Clinician: 03-23-1935 (81 y.o. Treating [name], [place] Date of Birth/Sex: Female) Physician/Extender: G [place] Physician: Referring Physician: [name] in Treatment: 13 History of Present Illness HPI Description: 11/22/15; this is [name] is a 81 year old woman who lives at home on her own. According to the patient and her daughter was present she has had long-standing edema in her legs dating back many years. She also has a history of chronic systolic heart failure, atrial fibrillation and is status post mitral valve replacement. [facility] showed an ejection fraction of 40-45% she is on Lasix 60 mg a day and spironolactone 25 mg a day. Her current problem began in December around Christmas time she developed a small hematoma in the medial part of her left leg which rapidly expanded to a very large hematoma that required surgical debridement. This situation was complicated by the fact that the patient is on long-standing Coumadin for mechanical heart valve. She went to the OR had this evacuated on January 4 /17. The wound has gradually improved  however she has developed a small wounds around this area and more recently a wound on the right lateral leg. She is weeping edema fluid. The patient is already been to see vascular surgery. It was recommended that she wear Unna boots, she did not tolerate this due to pain in the left leg. She was then prescribed Juzo stockings and really can't get these on herself although truthfully there is probably too  much edema for a Juzo stockings currently. She is not a diabetic and has no history of PAD or claudication that I could elicit. She does not use the external compression pumps reliably. She comes today with notes from her primary physician and Dr. Ronalee Belts both recommending various forms of compression but the patient does not really complied with them. Has been using the external compression pumps with some regularity but certainly not daily on the right leg and this has helped. I also note that her daughter tells me the history that she does not sleep in bed at home. She has a hospital bed but with her legs up she finds this painful so she sleeps in the couch sitting up with her legs dependent. 11/29/15; the patient is arrives today accompanied by her son. He expresses satisfaction that she is maintained the compression all week. 12/06/15; the patient has kept her Profore light wraps on, we have good edema control no major change in the wounds we have been using Aquacel 12/13/15; changed to RTD last week. One of the 3 wounds on her left medial leg is healed she has 2 remaining wounds here and one on the right lateral leg. 12/18/2015 -- the patient was at Dr. Nino Parsley office today and he was seeing her for an arterial study. While the wrap was being removed she had an inadvertent laceration of the left proximal anterior leg which bled quite profusely and a compression dressing was applied over this and the patient was here to get her Profore wraps done. I was asked to see the patient to make an  assessment and treat appropriately. 12/26/15; the patient's injury on the left proximal leg and Steri-Strips removed after soaking. There is an open area here. The original wounds to still open on the right lateral and left medial leg. 01/03/16 patient's injury on her proximal left leg looks quite good. Still small open area on the medial left leg which appears to be improving. The area on the right lateral leg still substantially open with no real improvement in wound depth. Her edema control is marginal with a Profore light. We have been using RTD Lasala, [name] ([id number]) for 3-4 weeks without any major change 01/10/16: wounds without s/s of infection. vascular results are pending regarding arterial studies. 01/17/16; patient comes in today complaining of severe pain however I think most of this is in the right hip not related to her wounds. She continues with a oval-shaped wound on the right lateral leg, trauma to the left anterior leg just below her tibial plateau. She has a smaller eschar on the left anterior leg. She is being using Prisma however she informs [place] today that she is actually allergic to silver, nose this from a previous application at [facility] some years ago 01/24/16; edema is not so well controlled today, I think I reduced her to [place] bilaterally last week. The area which was a scissor injury on her left upper anterior lower leg is fully epithelialized. She only has a small open area remaining on the medial aspect of her left leg. The oval-shaped wound on the right mid lateral leg may be a bit smaller. Debrided of surface slough nonviable subcutaneous tissue. I had changed to collagen 2 weeks ago in an attempt to get this to close 01/31/16 all the patient's wounds on her left leg give healed. We have good edema control with bilateral Profore lites which she has been compliant with. She still has the oval-shaped wound on  the right lateral calf allergic even this appears  to be slowly improving. The patient has juxtalite stockings at home. She states she thinks she can put these on. She also has external compression pumps at home although I think her compliance with this has not been good in the past. She complains today of edema in her thighs. Tells me she takes 40 mg of Lasix daily 02/21/16; only 1 small wound remains on the lateral aspect of the right calf. She is using Juzo stockings on the left leg although she complains about difficulty in applying them. She has external compression pumps at home Electronic Signature(s) Signed: 02/21/2016 5:34:52 PM By: [name] MD Entered By: [name] on 02/21/2016 10:44:52 [name], [name] ([id number]) -------------------------------------------------------------------------------- Physical Exam Details [name] Date of Service: 02/21/2016 10:00 AM Patient Name: C. Patient Account Number: [id number] Medical Record Treating RN: Montey Hora [id number] Number: Other Clinician: 29-Apr-1935 (81 y.o. Treating Celestino Ackerman, [place] Date of Birth/Sex: Female) Physician/Extender: G Primary Care [name] Physician: Referring Physician: [name] in Treatment: 60 Constitutional Patient is hypertensive.. Pulse regular and within target range for patient.Marland Kitchen Respirations regular, non-labored and within target range.. Temperature is normal and within the target range for the patient.. Cardiovascular Pedal pulses palpable and strong bilaterally.. Notes Wound exam; the wound on the right lateral lower extremity still has some depth and some slough that I debrided it. The base of this looks healthy. She has severe venous insufficiency in the left lower leg but no open wounds. Electronic Signature(s) Signed: 02/21/2016 5:34:52 PM By: [name] MD Entered By: [name] on 02/21/2016 10:46:44 [name]  ([id number]) -------------------------------------------------------------------------------- Physician Orders Details [name] Date of Service: 02/21/2016 10:00 AM Patient Name: C. Patient Account Number: [id number] Medical Record Treating RN: Montey Hora [id number] Number: Other Clinician: 1935-01-31 (81 y.o. Treating Klay Sobotka, [place] Date of Birth/Sex: Female) Physician/Extender: G Primary Care [name] Physician: Referring Physician: [name] in Treatment: 13 Verbal / Phone Orders: Yes Clinician: Montey Hora Read Back and Verified: Yes Diagnosis Coding Wound Cleansing Wound #2 Right,Lateral Lower Leg o Cleanse wound with mild soap and water Anesthetic Wound #2 Right,Lateral Lower Leg o Topical Lidocaine 4% cream applied to wound bed prior to debridement Primary Wound Dressing Wound #2 Right,Lateral Lower Leg o Promogran Secondary Dressing Wound #2 Right,Lateral Lower Leg o ABD pad Dressing Change Frequency Wound #2 Right,Lateral Lower Leg o Change dressing every week Follow-up Appointments Wound #2 Right,Lateral Lower Leg o Return Appointment in 1 week. Edema Control Wound #2 Right,Lateral Lower Leg o 4-Layer Compression System - Right Lower Extremity o Patient to wear own Juxtalite/Juzo compression garment. - left leg Electronic Signature(s) MOLLI, GAFFKE ([id number]) Signed: 02/21/2016 5:18:20 PM By: Montey Hora Signed: 02/21/2016 5:34:52 PM By: [name] MD Entered By: Montey Hora on 02/21/2016 10:26:05 [name] ([id number]) -------------------------------------------------------------------------------- Problem List Details [name] Date of Service: 02/21/2016 10:00 AM Patient Name: C. Patient Account Number: [id number] Medical Record Treating RN: Montey Hora [id number] Number: Other Clinician: 1935-01-07 (81 y.o. Treating Shirely Toren Date of Birth/Sex: Female)  Physician/Extender: G Primary Care [name] Physician: Referring Physician: [name] in Treatment: 13 Active Problems ICD-10 Encounter Code Description Active Date Diagnosis I87.333 Chronic venous hypertension (idiopathic) with ulcer and 11/22/2015 Yes inflammation of bilateral lower extremity I89.0 Lymphedema, not elsewhere classified 11/22/2015 Yes [id number] Chronic systolic (congestive) heart failure 11/22/2015 Yes S81.812A Laceration without foreign body, left lower leg, initial 12/18/2015 Yes encounter Inactive Problems Resolved Problems Electronic Signature(s)  Signed: 02/21/2016 5:34:52 PM By: [name] MD Entered By: [name] on 02/21/2016 10:43:21 [place], [name] ([id number]) -------------------------------------------------------------------------------- Progress Note Details [name] Date of Service: 02/21/2016 10:00 AM Patient Name: C. Patient Account Number: [id number] Medical Record Treating RN: Montey Hora [id number] Number: Other Clinician: 1935-06-30 (81 y.o. Treating Sachit Gilman, [place] Date of Birth/Sex: Female) Physician/Extender: G Primary Care [name] Physician: Referring Physician: [name] in Treatment: 13 Subjective Chief Complaint Information obtained from Patient Patient is here for review of bilateral lower extremity wounds dating back to December 2016 History of Present Illness (HPI) 11/22/15; this is Contrera is a 81 year old woman who lives at home on her own. According to the patient and her daughter was present she has had long-standing edema in her legs dating back many years. She also has a history of chronic systolic heart failure, atrial fibrillation and is status post mitral valve replacement. [facility] showed an ejection fraction of 40-45% she is on Lasix 60 mg a day and spironolactone 25 mg a day. Her current problem began in December around Christmas  time she developed a small hematoma in the medial part of her left leg which rapidly expanded to a very large hematoma that required surgical debridement. This situation was complicated by the fact that the patient is on long-standing Coumadin for mechanical heart valve. She went to the OR had this evacuated on January 4 /17. The wound has gradually improved however she has developed a small wounds around this area and more recently a wound on the right lateral leg. She is weeping edema fluid. The patient is already been to see vascular surgery. It was recommended that she wear Unna boots, she did not tolerate this due to pain in the left leg. She was then prescribed Juzo stockings and really can't get these on herself although truthfully there is probably too much edema for a Juzo stockings currently. She is not a diabetic and has no history of PAD or claudication that I could elicit. She does not use the external compression pumps reliably. She comes today with notes from her primary physician and Dr. Ronalee Belts both recommending various forms of compression but the patient does not really complied with them. Has been using the external compression pumps with some regularity but certainly not daily on the right leg and this has helped. I also note that her daughter tells me the history that she does not sleep in bed at home. She has a hospital bed but with her legs up she finds this painful so she sleeps in the couch sitting up with her legs dependent. 11/29/15; the patient is arrives today accompanied by her son. He expresses satisfaction that she is maintained the compression all week. 12/06/15; the patient has kept her Profore light wraps on, we have good edema control no major change in the wounds we have been using Aquacel 12/13/15; changed to RTD last week. One of the 3 wounds on her left medial leg is healed she has 2 remaining wounds here and one on the right lateral leg. 12/18/2015 -- the  patient was at Dr. Nino Parsley office today and he was seeing her for an arterial study. While the wrap was being removed she had an inadvertent laceration of the left proximal anterior leg which bled quite profusely and a compression dressing was applied over this and the patient was here to get her Profore wraps done. I was asked to see the patient to make an  assessment and treat appropriately. [name], [name] ([id number]) 12/26/15; the patient's injury on the left proximal leg and Steri-Strips removed after soaking. There is an open area here. The original wounds to still open on the right lateral and left medial leg. 01/03/16 patient's injury on her proximal left leg looks quite good. Still small open area on the medial left leg which appears to be improving. The area on the right lateral leg still substantially open with no real improvement in wound depth. Her edema control is marginal with a Profore light. We have been using RTD for 3-4 weeks without any major change 01/10/16: wounds without s/s of infection. vascular results are pending regarding arterial studies. 01/17/16; patient comes in today complaining of severe pain however I think most of this is in the right hip not related to her wounds. She continues with a oval-shaped wound on the right lateral leg, trauma to the left anterior leg just below her tibial plateau. She has a smaller eschar on the left anterior leg. She is being using Prisma however she informs [place] today that she is actually allergic to silver, nose this from a previous application at [facility] some years ago 01/24/16; edema is not so well controlled today, I think I reduced her to [place] bilaterally last week. The area which was a scissor injury on her left upper anterior lower leg is fully epithelialized. She only has a small open area remaining on the medial aspect of her left leg. The oval-shaped wound on the right mid lateral leg may be a bit smaller. Debrided of  surface slough nonviable subcutaneous tissue. I had changed to collagen 2 weeks ago in an attempt to get this to close 01/31/16 all the patient's wounds on her left leg give healed. We have good edema control with bilateral Profore lites which she has been compliant with. She still has the oval-shaped wound on the right lateral calf allergic even this appears to be slowly improving. The patient has juxtalite stockings at home. She states she thinks she can put these on. She also has external compression pumps at home although I think her compliance with this has not been good in the past. She complains today of edema in her thighs. Tells me she takes 40 mg of Lasix daily 02/21/16; only 1 small wound remains on the lateral aspect of the right calf. She is using Juzo stockings on the left leg although she complains about difficulty in applying them. She has external compression pumps at home Objective Constitutional Patient is hypertensive.. Pulse regular and within target range for patient.Marland Kitchen Respirations regular, non-labored and within target range.. Temperature is normal and within the target range for the patient.. Vitals Time Taken: 10:03 AM, Height: 68 in, Weight: 178 lbs, BMI: 27.1, Temperature: 97.8 F, Pulse: 55 bpm, Respiratory Rate: 18 breaths/min, Blood Pressure: 154/60 mmHg. Cardiovascular Pedal pulses palpable and strong bilaterally.. General Notes: Wound exam; the wound on the right lateral lower extremity still has some depth and some slough that I debrided it. The base of this looks healthy. She has severe venous insufficiency in the left lower leg but no open wounds. [name], [name] ([id number]) Integumentary (Hair, Skin) Wound #2 status is Open. Original cause of wound was Gradually Appeared. The wound is located on the Right,Lateral Lower Leg. The wound measures 1.1cm length x 0.5cm width x 0.2cm depth; 0.432cm^2 area and 0.086cm^3 volume. The wound is limited to skin  breakdown. There is no tunneling or undermining noted. There is  a large amount of serosanguineous drainage noted. The wound margin is distinct with the outline attached to the wound base. There is large (67-100%) pink granulation within the wound bed. There is a small (1-33%) amount of necrotic tissue within the wound bed including Adherent Slough. The periwound skin appearance exhibited: Localized Edema, Moist. The periwound skin appearance did not exhibit: Callus, Crepitus, Excoriation, Fluctuance, Friable, Induration, Rash, Scarring, Dry/Scaly, Maceration, Atrophie Blanche, Cyanosis, Ecchymosis, Hemosiderin Staining, Mottled, Pallor, Rubor, Erythema. Periwound temperature was noted as No Abnormality. The periwound has tenderness on palpation. Assessment Active Problems ICD-10 I87.333 - Chronic venous hypertension (idiopathic) with ulcer and inflammation of bilateral lower extremity I89.0 - Lymphedema, not elsewhere classified [id number] - Chronic systolic (congestive) heart failure [id number] - Laceration without foreign body, left lower leg, initial encounter Procedures Wound #2 Wound #2 is a Lymphedema located on the Right,Lateral Lower Leg . There was a Skin/Subcutaneous Tissue Debridement [id number]) debridement with total area of 0.55 sq cm performed by [name], MD. with the following instrument(s): Curette to remove Viable and Non-Viable tissue/material including Fibrin/Slough and Subcutaneous after achieving pain control using Lidocaine 4% Topical Solution. A time out was conducted at 10:26, prior to the start of the procedure. A Minimum amount of bleeding was controlled with Pressure. The procedure was tolerated well with a pain level of 0 throughout and a pain level of 0 following the procedure. Post Debridement Measurements: 1.1cm length x 0.5cm width x 0.2cm depth; 0.086cm^3 volume. Character of Wound/Ulcer Post Debridement requires further debridement. Severity of  Tissue Post Debridement is: Limited to breakdown of skin. Post procedure Diagnosis Wound #2: Same as Pre-Procedure [name], [name]. ([id number]) Plan Wound Cleansing: Wound #2 Right,Lateral Lower Leg: Cleanse wound with mild soap and water Anesthetic: Wound #2 Right,Lateral Lower Leg: Topical Lidocaine 4% cream applied to wound bed prior to debridement Primary Wound Dressing: Wound #2 Right,Lateral Lower Leg: Promogran Secondary Dressing: Wound #2 Right,Lateral Lower Leg: ABD pad Dressing Change Frequency: Wound #2 Right,Lateral Lower Leg: Change dressing every week Follow-up Appointments: Wound #2 Right,Lateral Lower Leg: Return Appointment in 1 week. Edema Control: Wound #2 Right,Lateral Lower Leg: 4-Layer Compression System - Right Lower Extremity Patient to wear own Juxtalite/Juzo compression garment. - left leg #1 we continue with Prisma, Profore to the right leg #2 I continue to advise her to continue with the juzzo stocking on the left leg and going forward she is going to need to use the one on the right leg when his heels. She has external compression pumps at home Electronic Signature(s) Signed: 02/21/2016 5:34:52 PM By: [name] MD Entered By: [name] on 02/21/2016 10:47:37 [name], [name] ([id number]) -------------------------------------------------------------------------------- SuperBill Details Patient Name: [name] C. Date of Service: 02/21/2016 Medical Record Patient Account Number: [id number] Number: Treating RN: Montey Hora 1934/10/25 (80 y.o. Other Clinician: Date of Birth/Sex: Female) Treating Nyeli Holtmeyer Primary Care Physician/Extender: Claudette Laws Physician: Suella [place] in Treatment: 13 Referring Physician: [name] Diagnosis Coding ICD-10 Codes Code Description Chronic venous hypertension (idiopathic) with ulcer and inflammation of bilateral lower I87.333 extremity I89.0 Lymphedema, not  elsewhere classified [id number] Chronic systolic (congestive) heart failure S81.812A Laceration without foreign body, left lower leg, initial encounter Facility Procedures CPT4: Description Modifier Quantity Code [id number] - DEB SUBQ TISSUE 20 SQ CM/< 1 ICD-10 Description Diagnosis I87.333 Chronic venous hypertension (idiopathic) with ulcer and inflammation of bilateral lower extremity Physician Procedures CPT4: Description Modifier Quantity Code [id number] - WC PHYS SUBQ TISS 20 SQ CM 1 ICD-10  Description Diagnosis I87.333 Chronic venous hypertension (idiopathic) with ulcer and inflammation of bilateral lower extremity Electronic Signature(s) Signed: 02/21/2016 5:34:52 PM By: [name] MD Entered By: [name] on 02/21/2016 10:48:07

## 2016-02-22 NOTE — Progress Notes (Signed)
MARGUARITE, HOTTEL ([id number]) Visit Report for 02/21/2016 Arrival Information Details Patient Name: CODI, LANSDEN. Date of Service: 02/21/2016 10:00 AM Medical Record Patient Account Number: [id number] Number: Treating RN: Montey Hora March 16, 1935 (81 y.o. Other Clinician: Date of Birth/Sex: Female) Treating [name], [name] Primary Care Physician/Extender: Claudette Laws Physician: Referring Physician: [name] in Treatment: 49 Visit Information History Since Last Visit Added or deleted any medications: No Patient Arrived: Cane Any new allergies or adverse reactions: No Arrival Time: 10:01 Had a fall or experienced change in No Accompanied By: dtr activities of daily living that may affect Transfer Assistance: None risk of falls: Patient Identification Verified: Yes Signs or symptoms of abuse/neglect since last No Secondary Verification Process Yes visito Completed: Hospitalized since last visit: No Patient Requires Transmission- No Pain Present Now: No Based Precautions: Patient Has Alerts: Yes Patient Alerts: Patient on Blood Thinner [place] Signature(s) Signed: 02/21/2016 5:18:20 PM By: Montey Hora Entered By: Montey Hora on 02/21/2016 10:01:43 Dogan, [name] ([id number]) -------------------------------------------------------------------------------- Encounter Discharge Information Details Patient Name: [name] Date of Service: 02/21/2016 10:00 AM Medical Record Patient Account Number: [id number] Number: Treating RN: Montey Hora 1934/11/18 (81 y.o. Other Clinician: Date of Birth/Sex: Female) Treating [name], [name] Primary Care Physician/Extender: Claudette Laws Physician: Referring Physician: [name] in Treatment: 13 Encounter Discharge Information Items Discharge Pain Level: 0 Discharge Condition: Stable Ambulatory Status: Cane Discharge  Destination: Home Transportation: Private Auto Accompanied By: dtr Schedule Follow-up Appointment: Yes Medication Reconciliation completed No and provided to Patient/Care Dafina Suk: Provided on Clinical Summary of Care: 02/21/2016 Form Type Recipient Paper Patient [place] Electronic Signature(s) Signed: 02/21/2016 10:48:05 AM By: Ruthine Dose Entered By: Ruthine Dose on 02/21/2016 10:48:05 Appleby, [name] ([id number]) -------------------------------------------------------------------------------- Lower Extremity Assessment Details Patient Name: [name] Date of Service: 02/21/2016 10:00 AM Medical Record Patient Account Number: [id number] Number: Treating RN: Montey Hora [birth year] (81 y.o. Other Clinician: Date of Birth/Sex: Female) Treating [name], [name] Primary Care Physician/Extender: Claudette Laws Physician: Referring Physician: [name] in Treatment: 13 Edema Assessment Assessed: [Left: No] [Right: No] Edema: [Left: Ye] [Right: s] Calf Left: Right: Point of Measurement: 36 cm From Medial Instep cm 32.8 cm Ankle Left: Right: Point of Measurement: 10 cm From Medial Instep cm 20.4 cm Vascular Assessment Pulses: Posterior Tibial Dorsalis Pedis Palpable: [Right:Yes] Extremity colors, hair growth, and conditions: Extremity Color: [Right:Normal] Hair Growth on Extremity: [Right:No] Temperature of Extremity: [Right:Warm] Capillary Refill: [Right:< 3 seconds] Electronic Signature(s) Signed: 02/21/2016 5:18:20 PM By: Montey Hora Entered By: Montey Hora on 02/21/2016 10:11:19 Kraemer, [name] ([id number]) -------------------------------------------------------------------------------- Multi Wound Chart Details Patient Name: [name] Date of Service: 02/21/2016 10:00 AM Medical Record Patient Account Number: [id number] Number: Treating RN: Montey Hora [birth year] (81 y.o. Other Clinician: Date  of Birth/Sex: Female) Treating [name], [name] Primary Care Physician/Extender: Claudette Laws Physician: Referring Physician: [name] in Treatment: 13 Vital Signs Height(in): 68 Pulse(bpm): 55 Weight(lbs): 178 Blood Pressure 154/60 (mmHg): Body Mass Index(BMI): 27 Temperature(F): 97.8 Respiratory Rate 18 (breaths/min): Photos: [N/A:N/A] Wound Location: Right Lower Leg - Lateral N/A N/A Wounding Event: Gradually Appeared N/A N/A Primary Etiology: Lymphedema N/A N/A Comorbid History: Cataracts, Arrhythmia, N/A N/A Hypotension, Osteoarthritis Date Acquired: 07/04/2015 N/A N/A Weeks of Treatment: 13 N/A N/A Wound Status: Open N/A N/A Measurements L x W x D 1.1x0.5x0.2 N/A N/A (cm) Area (cm) : 0.432 N/A N/A Volume (cm) : 0.086 N/A N/A % Reduction in Area: 69.40% N/A  N/A % Reduction in Volume: 39.00% N/A N/A Classification: Partial Thickness N/A N/A Exudate Amount: Large N/A N/A Exudate Type: Serosanguineous N/A N/A Exudate Color: red, brown N/A N/A Wound Margin: Distinct, outline attached N/A N/A Olivero, Kuulei C. ([id number]) Granulation Amount: Large (67-100%) N/A N/A Granulation Quality: Pink N/A N/A Necrotic Amount: Small (1-33%) N/A N/A Exposed Structures: Fascia: No N/A N/A Fat: No Tendon: No Muscle: No Joint: No Bone: No Limited to Skin Breakdown Epithelialization: None N/A N/A Periwound Skin Texture: Edema: Yes N/A N/A Excoriation: No Induration: No Callus: No Crepitus: No Fluctuance: No Friable: No Rash: No Scarring: No Periwound Skin Moist: Yes N/A N/A Moisture: Maceration: No Dry/Scaly: No Periwound Skin Color: Atrophie Blanche: No N/A N/A Cyanosis: No Ecchymosis: No Erythema: No Hemosiderin Staining: No Mottled: No Pallor: No Rubor: No Temperature: No Abnormality N/A N/A Tenderness on Yes N/A N/A Palpation: Wound Preparation: Ulcer Cleansing: Other: N/A N/A soap and water Topical Anesthetic Applied: Other:  lidocaine 4% Treatment Notes Electronic Signature(s) Signed: 02/21/2016 5:18:20 PM By: Montey Hora Entered By: Montey Hora on 02/21/2016 10:18:29 Kotecki, [name] ([id number]) -------------------------------------------------------------------------------- [place] Details Patient Name: [name] Date of Service: 02/21/2016 10:00 AM Medical Record Patient Account Number: [id number] Number: Treating RN: Montey Hora [birth year] (80 y.o. Other Clinician: Date of Birth/Sex: Female) Treating [name], [name] Primary Care Physician/Extender: Claudette Laws Physician: Referring Physician: [name] in Treatment: 13 Active Inactive Orientation to the Wound Care Program Nursing Diagnoses: Knowledge deficit related to the wound healing center program Goals: Patient/caregiver will verbalize understanding of the [facility] Program Date Initiated: 11/22/2015 Goal Status: Active Interventions: Provide education on orientation to the wound center Notes: Venous Leg Ulcer Nursing Diagnoses: Knowledge deficit related to disease process and management Potential for venous Insuffiency (use before diagnosis confirmed) Goals: Patient will maintain optimal edema control Date Initiated: 11/22/2015 Goal Status: Active Patient/caregiver will verbalize understanding of disease process and disease management Date Initiated: 11/22/2015 Goal Status: Active Verify adequate tissue perfusion prior to therapeutic compression application Date Initiated: 11/22/2015 Goal Status: Active Interventions: Assess peripheral edema status every visit. MIYANAH, CISCO ([id number]) Compression as ordered Provide education on venous insufficiency Treatment Activities: Non-invasive vascular studies : 11/22/2015 Therapeutic compression applied : 11/22/2015 Notes: Wound/Skin Impairment Nursing Diagnoses: Impaired tissue integrity Knowledge  deficit related to ulceration/compromised skin integrity Goals: Patient/caregiver will verbalize understanding of skin care regimen Date Initiated: 11/22/2015 Goal Status: Active Ulcer/skin breakdown will have a volume reduction of 30% by week 4 Date Initiated: 11/22/2015 Goal Status: Active Ulcer/skin breakdown will have a volume reduction of 50% by week 8 Date Initiated: 11/22/2015 Goal Status: Active Ulcer/skin breakdown will have a volume reduction of 80% by week 12 Date Initiated: 11/22/2015 Goal Status: Active Ulcer/skin breakdown will heal within 14 weeks Date Initiated: 11/22/2015 Goal Status: Active Interventions: Assess patient/caregiver ability to perform ulcer/skin care regimen upon admission and as needed Assess ulceration(s) every visit Provide education on ulcer and skin care Treatment Activities: Skin care regimen initiated : 11/22/2015 Topical wound management initiated : 11/22/2015 Notes: Electronic Signature(s) Signed: 02/21/2016 5:18:20 PM By: Gerrit Friends, [name] ([id number]) Entered By: Montey Hora on 02/21/2016 10:18:12 Donofrio, [name] ([id number]) -------------------------------------------------------------------------------- Pain Assessment Details Patient Name: [name] Date of Service: 02/21/2016 10:00 AM Medical Record Patient Account Number: [id number] Number: Treating RN: Montey Hora [birth year] (80 y.o. Other Clinician: Date of Birth/Sex: Female) Treating [name], [name] Primary Care Physician/Extender: Claudette Laws Physician: Referring Physician: [name] in Treatment:  13 Active Problems Location of Pain Severity and Description of Pain Patient Has Paino No Site Locations Pain Management and Medication Current Pain Management: Notes Topical or injectable lidocaine is offered to patient for acute pain when surgical debridement is performed. If needed, Patient is instructed to use over  the counter pain medication for the following 24-48 hours after debridement. Wound care MDs do not prescribed pain medications. Patient has chronic pain or uncontrolled pain. Patient has been instructed to make an appointment with their Primary Care Physician for pain management. Electronic Signature(s) Signed: 02/21/2016 5:18:20 PM By: Montey Hora Entered By: Montey Hora on 02/21/2016 10:03:19 Banes, [name] ([id number]) -------------------------------------------------------------------------------- Patient/Caregiver Education Details Patient Name: [name] Date of Service: 02/21/2016 10:00 AM Medical Record Patient Account Number: [id number] Number: Treating RN: Montey Hora 08-01-1934 (80 y.o. Other Clinician: Date of Birth/Gender: Female) Treating [name], [name] Primary Care Physician/Extender: Claudette Laws Physician: Suella [place] in Treatment: 13 Referring Physician: [name] Education Assessment Education Provided To: Patient and Caregiver Education Topics Provided Venous: Handouts: Controlling Swelling with Compression Stockings Methods: Explain/Verbal Responses: State content correctly Electronic Signature(s) Signed: 02/21/2016 5:18:20 PM By: Montey Hora Entered By: Montey Hora on 02/21/2016 10:22:57 Fuhriman, [name] ([id number]) -------------------------------------------------------------------------------- Wound Assessment Details Patient Name: [name] Date of Service: 02/21/2016 10:00 AM Medical Record Patient Account Number: [id number] Number: Treating RN: Montey Hora 08-25-1934 (80 y.o. Other Clinician: Date of Birth/Sex: Female) Treating [name], [name] Primary Care Physician/Extender: Claudette Laws Physician: Referring Physician: [name] in Treatment: 13 Wound Status Wound Number: 2 Primary Lymphedema Etiology: Wound Location: Right Lower Leg - Lateral Wound Status:  Open Wounding Event: Gradually Appeared Comorbid Cataracts, Arrhythmia, Hypotension, Date Acquired: 07/04/2015 History: Osteoarthritis Weeks Of Treatment: 13 Clustered Wound: No Photos Wound Measurements Length: (cm) 1.1 % Reduction i Width: (cm) 0.5 % Reduction i Depth: (cm) 0.2 Epithelializa Area: (cm) 0.432 Tunneling: Volume: (cm) 0.086 Undermining: n Area: 69.4% n Volume: 39% tion: None No No Wound Description Classification: Partial Thickness Wound Margin: Distinct, outline attached Exudate Amount: Large Exudate Type: Serosanguineous Exudate Color: red, brown Foul Odor After Cleansing: No Wound Bed Granulation Amount: Large (67-100%) Exposed Structure Granulation Quality: Pink Fascia Exposed: No Necrotic Amount: Small (1-33%) Fat Layer Exposed: No Boreman, Delena C. ([id number]) Necrotic Quality: Adherent Slough Tendon Exposed: No Muscle Exposed: No Joint Exposed: No Bone Exposed: No Limited to Skin Breakdown Periwound Skin Texture Texture Color No Abnormalities Noted: No No Abnormalities Noted: No Callus: No Atrophie Blanche: No Crepitus: No Cyanosis: No Excoriation: No Ecchymosis: No Fluctuance: No Erythema: No Friable: No Hemosiderin Staining: No Induration: No Mottled: No Localized Edema: Yes Pallor: No Rash: No Rubor: No Scarring: No Temperature / Pain Moisture Temperature: No Abnormality No Abnormalities Noted: No Tenderness on Palpation: Yes Dry / Scaly: No Maceration: No Moist: Yes Wound Preparation Ulcer Cleansing: Other: soap and water, Topical Anesthetic Applied: Other: lidocaine 4%, Treatment Notes Wound #2 (Right, Lateral Lower Leg) 1. Cleansed with: Cleanse wound with antibacterial soap and water 2. Anesthetic Topical Lidocaine 4% cream to wound bed prior to debridement 4. Dressing Applied: Promogran 5. Secondary Dressing Applied ABD Pad 7. Secured with 4-Layer Compression System - Right Lower  Extremity Notes unna to anchor Electronic Signature(s) Signed: 02/21/2016 5:18:20 PM By: Montey Hora Entered By: Montey Hora on 02/21/2016 10:16:51 Popwell, GERALDENE HOLK ([id number]) [place], [place]. ([id number]) -------------------------------------------------------------------------------- Vitals Details Patient Name: [name] Date of Service: 02/21/2016 10:00 AM Medical Record Patient Account Number: [id number] Number: Treating  RN: Montey Hora 22-Nov-1934 (80 y.o. Other Clinician: Date of Birth/Sex: Female) Treating [name], [name] Primary Care Physician/Extender: Claudette Laws Physician: Referring Physician: [name] in Treatment: 13 Vital Signs Time Taken: 10:03 Temperature (F): 97.8 Height (in): 68 Pulse (bpm): 55 Weight (lbs): 178 Respiratory Rate (breaths/min): 18 Body Mass Index (BMI): 27.1 Blood Pressure (mmHg): 154/60 Reference Range: 80 - 120 mg / dl Electronic Signature(s) Signed: 02/21/2016 5:18:20 PM By: Montey Hora Entered By: Montey Hora on 02/21/2016 10:04:38

## 2016-02-26 ENCOUNTER — Ambulatory Visit: Payer: Medicare Other

## 2016-02-28 ENCOUNTER — Ambulatory Visit: Payer: Medicare Other

## 2016-02-28 ENCOUNTER — Encounter: Payer: Medicare Other | Admitting: Internal Medicine

## 2016-02-28 DIAGNOSIS — I5042 Chronic combined systolic (congestive) and diastolic (congestive) heart failure: Secondary | ICD-10-CM | POA: Diagnosis not present

## 2016-02-29 ENCOUNTER — Ambulatory Visit: Payer: Medicare Other

## 2016-03-01 NOTE — Progress Notes (Signed)
MALANNI, DEVOY ([id number]) Visit Report for 02/28/2016 Arrival Information Details Patient Name: JORRYN, HADDOW. Date of Service: 02/28/2016 1:30 PM Medical Record Patient Account Number: [id number] Number: Treating RN: [name] 02-05-1935 (81 y.o. Other Clinician: Date of Birth/Sex: Female) Treating [name], [name] Primary Care Physician/Extender: Claudette Laws Physician: Referring Physician: [name] in Treatment: 14 Visit Information History Since Last Visit All ordered tests and consults were completed: No Patient Arrived: Kasandra Knudsen Added or deleted any medications: No Arrival Time: 13:43 Any new allergies or adverse reactions: No Accompanied By: son Had a fall or experienced change in No Transfer Assistance: None activities of daily living that may affect Patient Identification Verified: Yes risk of falls: Secondary Verification Process Yes Signs or symptoms of abuse/neglect since last No Completed: visito Patient Requires Transmission- No Hospitalized since last visit: No Based Precautions: Pain Present Now: No Patient Has Alerts: Yes Patient Alerts: Patient on Blood Thinner [place] Signature(s) Signed: 02/29/2016 4:42:28 PM By: [name] Entered By: [name] on 02/28/2016 13:45:38 Letson, Selisa Loletha Grayer ([id number]) -------------------------------------------------------------------------------- Encounter Discharge Information Details Patient Name: [name] Date of Service: 02/28/2016 1:30 PM Medical Record Patient Account Number: [id number] Number: Treating RN: [name] 10-29-1934 (81 y.o. Other Clinician: Date of Birth/Sex: Female) Treating [name], [name] Primary Care Physician/Extender: Claudette Laws Physician: Referring Physician: [name] in Treatment: 14 Encounter Discharge Information Items Discharge Pain Level: 0 Discharge  Condition: Stable Ambulatory Status: Cane Discharge Destination: Home Transportation: Private Auto Accompanied By: son Schedule Follow-up Appointment: Yes Medication Reconciliation completed Yes and provided to Patient/Care Shervon Kerwin: Provided on Clinical Summary of Care: 02/28/2016 Form Type Recipient Paper Patient [place] Electronic Signature(s) Signed: 02/28/2016 2:37:17 PM By: Ruthine Dose Entered By: Ruthine Dose on 02/28/2016 14:37:16 Ogborn, [name] ([id number]) -------------------------------------------------------------------------------- Lower Extremity Assessment Details Patient Name: [name] Date of Service: 02/28/2016 1:30 PM Medical Record Patient Account Number: [id number] Number: Treating RN: [name] 1935-03-18 (81 y.o. Other Clinician: Date of Birth/Sex: Female) Treating [name], [name] Primary Care Physician/Extender: Claudette Laws Physician: Referring Physician: [name] in Treatment: 14 Edema Assessment Assessed: [Left: No] [Right: No] E[Left: dema] [Right: :] Calf Left: Right: Point of Measurement: 36 cm From Medial Instep cm cm Ankle Left: Right: Point of Measurement: 10 cm From Medial Instep cm cm Electronic Signature(s) Signed: 02/29/2016 4:42:28 PM By: [name] Entered By: [name] on 02/28/2016 14:12:23 Lerew, Ahmari Loletha Grayer ([id number]) -------------------------------------------------------------------------------- Multi Wound Chart Details Patient Name: [name] Date of Service: 02/28/2016 1:30 PM Medical Record Patient Account Number: [id number] Number: Treating RN: [name] 1934-12-25 (81 y.o. Other Clinician: Date of Birth/Sex: Female) Treating [name], [name] Primary Care Physician/Extender: Claudette Laws Physician: Referring Physician: [name] in Treatment: 14 Vital Signs Height(in): 68 Pulse(bpm): 56 Weight(lbs): 178 Blood  Pressure 149/54 (mmHg): Body Mass Index(BMI): 27 Temperature(F): 97.5 Respiratory Rate 18 (breaths/min): Photos: [2:No Photos] [4:No Photos] [5:No Photos] Wound Location: [2:Right Lower Leg - Lateral Right Lower Leg -] [4:Proximal] [5:Right Lower Leg - Distal] Wounding Event: [2:Gradually Appeared] [4:Shear/Friction] [5:Shear/Friction] Primary Etiology: [2:Lymphedema] [4:Skin Tear] [5:Skin Tear] Comorbid History: [2:Cataracts, Arrhythmia, Hypotension, Osteoarthritis] [4:Cataracts, Arrhythmia, Hypotension, Osteoarthritis] [5:Cataracts, Arrhythmia, Hypotension, Osteoarthritis] Date Acquired: [2:07/04/2015] [4:02/28/2016] [5:02/28/2016] Weeks of Treatment: [2:14] [4:0] [5:0] Wound Status: [2:Open] [4:Open] [5:Open] Measurements L x W x D 0.7x0.4x0.2 [4:0.3x0.3x0.1] [5:1x1.2x0.1] (cm) Area (cm) : [2:0.22] [4:0.071] [5:0.942] Volume (cm) : [2:0.044] [4:0.007] [5:0.094] % Reduction in Area: [2:84.40%] [4:N/A] [5:N/A] % Reduction in Volume: 68.80% [  4:N/A] [5:N/A] Classification: [2:Partial Thickness] [4:Partial Thickness] [5:Partial Thickness] Exudate Amount: [2:Large] [4:Large] [5:Large] Exudate Type: [2:Serosanguineous] [4:Serosanguineous] [5:Serosanguineous] Exudate Color: [2:red, brown] [4:red, brown] [5:red, brown] Wound Margin: [2:Distinct, outline attached Distinct, outline attached] [5:Distinct, outline attached] Granulation Amount: [2:Large (67-100%)] [4:Large (67-100%)] [5:Large (67-100%)] Granulation Quality: [2:Pink] [4:Red] [5:Red] Necrotic Amount: [2:Small (1-33%)] [4:None Present (0%)] [5:None Present (0%)] Exposed Structures: [2:Fascia: No Fat: No] [4:Fascia: No Fat: No] [5:Fascia: No Fat: No] Tendon: No Tendon: No Tendon: No Muscle: No Muscle: No Muscle: No Joint: No Joint: No Joint: No Bone: No Bone: No Bone: No Limited to Skin Limited to Skin Limited to Skin Breakdown Breakdown Breakdown Epithelialization: None None None Periwound Skin Texture: Edema: Yes  No Abnormalities Noted No Abnormalities Noted Excoriation: No Induration: No Callus: No Crepitus: No Fluctuance: No Friable: No Rash: No Scarring: No Periwound Skin Moist: Yes Moist: Yes Moist: Yes Moisture: Maceration: No Dry/Scaly: No Periwound Skin Color: Atrophie Blanche: No No Abnormalities Noted No Abnormalities Noted Cyanosis: No Ecchymosis: No Erythema: No Hemosiderin Staining: No Mottled: No Pallor: No Rubor: No Temperature: No Abnormality No Abnormality No Abnormality Tenderness on Yes Yes Yes Palpation: Wound Preparation: Ulcer Cleansing: Other: Ulcer Cleansing: Other: Ulcer Cleansing: Other: soap and water soap and water soap and water Topical Anesthetic Topical Anesthetic Topical Anesthetic Applied: Other: lidocaine Applied: Other: lidocaine Applied: None 4% 4% Treatment Notes Electronic Signature(s) Signed: 02/29/2016 4:42:28 PM By: [name] Entered By: [name] on 02/28/2016 14:12:49 Friend, Nandini Loletha Grayer ([id number]) -------------------------------------------------------------------------------- [place] Details Patient Name: [name] Date of Service: 02/28/2016 1:30 PM Medical Record Patient Account Number: [id number] Number: Treating RN: [name] [birth year] (80 y.o. Other Clinician: Date of Birth/Sex: Female) Treating [name], [name] Primary Care Physician/Extender: Claudette Laws Physician: Referring Physician: [name] in Treatment: 14 Active Inactive Orientation to the Wound Care Program Nursing Diagnoses: Knowledge deficit related to the wound healing center program Goals: Patient/caregiver will verbalize understanding of the [place] Program Date Initiated: 11/22/2015 Goal Status: Active Interventions: Provide education on orientation to the wound center Notes: Venous Leg Ulcer Nursing Diagnoses: Knowledge deficit related to disease process and  management Potential for venous Insuffiency (use before diagnosis confirmed) Goals: Patient will maintain optimal edema control Date Initiated: 11/22/2015 Goal Status: Active Patient/caregiver will verbalize understanding of disease process and disease management Date Initiated: 11/22/2015 Goal Status: Active Verify adequate tissue perfusion prior to therapeutic compression application Date Initiated: 11/22/2015 Goal Status: Active Interventions: Assess peripheral edema status every visit. TRAYANA, BOLEY ([id number]) Compression as ordered Provide education on venous insufficiency Treatment Activities: Non-invasive vascular studies : 11/22/2015 Therapeutic compression applied : 11/22/2015 Notes: Wound/Skin Impairment Nursing Diagnoses: Impaired tissue integrity Knowledge deficit related to ulceration/compromised skin integrity Goals: Patient/caregiver will verbalize understanding of skin care regimen Date Initiated: 11/22/2015 Goal Status: Active Ulcer/skin breakdown will have a volume reduction of 30% by week 4 Date Initiated: 11/22/2015 Goal Status: Active Ulcer/skin breakdown will have a volume reduction of 50% by week 8 Date Initiated: 11/22/2015 Goal Status: Active Ulcer/skin breakdown will have a volume reduction of 80% by week 12 Date Initiated: 11/22/2015 Goal Status: Active Ulcer/skin breakdown will heal within 14 weeks Date Initiated: 11/22/2015 Goal Status: Active Interventions: Assess patient/caregiver ability to perform ulcer/skin care regimen upon admission and as needed Assess ulceration(s) every visit Provide education on ulcer and skin care Treatment Activities: Skin care regimen initiated : 11/22/2015 Topical wound management initiated : 11/22/2015 Notes: Electronic Signature(s) Signed: 02/29/2016 4:42:28 PM By: De Burrs, Freda  Loletha Grayer ([id number]) Entered By: [name] on 02/28/2016 14:12:40 Fischl, [name]  ([id number]) -------------------------------------------------------------------------------- Pain Assessment Details Patient Name: EVALEIGH, DEESE C. Date of Service: 02/28/2016 1:30 PM Medical Record Patient Account Number: [id number] Number: Treating RN: [name] 1934-09-09 (80 y.o. Other Clinician: Date of Birth/Sex: Female) Treating [name], [name] Primary Care Physician/Extender: Claudette Laws Physician: Referring Physician: [name] in Treatment: 14 Active Problems Location of Pain Severity and Description of Pain Patient Has Paino No Site Locations With Dressing Change: No Pain Management and Medication Current Pain Management: Electronic Signature(s) Signed: 02/29/2016 4:42:28 PM By: [name] Entered By: [name] on 02/28/2016 13:45:46 Mcclish, Syrah Loletha Grayer ([id number]) -------------------------------------------------------------------------------- Patient/Caregiver Education Details Patient Name: [name] Date of Service: 02/28/2016 1:30 PM Medical Record Patient Account Number: [id number] Number: Treating RN: [name] April 07, 1935 (80 y.o. Other Clinician: Date of Birth/Gender: Female) Treating [name], [name] Primary Care Physician/Extender: Claudette Laws Physician: Suella [place] in Treatment: 14 Referring Physician: [name] Education Assessment Education Provided To: Patient Education Topics Provided Wound/Skin Impairment: Handouts: Other: change dressing as ordered and do not get dressings wet Methods: Demonstration, Explain/Verbal Responses: State content correctly Electronic Signature(s) Signed: 02/29/2016 4:42:28 PM By: [name] Entered By: [name] on 02/28/2016 14:11:51 [place], [place]. ([id number]) -------------------------------------------------------------------------------- Wound Assessment Details Patient Name: [name], Didi C. Date of Service:  02/28/2016 1:30 PM Medical Record Patient Account Number: [id number] Number: Treating RN: [name] [birth year] (80 y.o. Other Clinician: Date of Birth/Sex: Female) Treating [name], [name] Primary Care Physician/Extender: Claudette Laws Physician: Referring Physician: [name] in Treatment: 14 Wound Status Wound Number: 2 Primary Lymphedema Etiology: Wound Location: Right Lower Leg - Lateral Wound Status: Open Wounding Event: Gradually Appeared Comorbid Cataracts, Arrhythmia, Hypotension, Date Acquired: 07/04/2015 History: Osteoarthritis Weeks Of Treatment: 14 Clustered Wound: No Photos Photo Uploaded By: [name] on 02/29/2016 09:27:51 Wound Measurements Length: (cm) 0.7 % Reduction i Width: (cm) 0.4 % Reduction i Depth: (cm) 0.2 Epithelializa Area: (cm) 0.22 Tunneling: Volume: (cm) 0.044 Undermining: n Area: 84.4% n Volume: 68.8% tion: None No No Wound Description Classification: Partial Thickness Wound Margin: Distinct, outline attached Exudate Amount: Large Exudate Type: Serosanguineous Exudate Color: red, brown Foul Odor After Cleansing: No Wound Bed Granulation Amount: Large (67-100%) Exposed Structure Granulation Quality: Pink Fascia Exposed: No Newcomer, Alayzia C. ([id number]) Necrotic Amount: Small (1-33%) Fat Layer Exposed: No Necrotic Quality: Adherent Slough Tendon Exposed: No Muscle Exposed: No Joint Exposed: No Bone Exposed: No Limited to Skin Breakdown Periwound Skin Texture Texture Color No Abnormalities Noted: No No Abnormalities Noted: No Callus: No Atrophie Blanche: No Crepitus: No Cyanosis: No Excoriation: No Ecchymosis: No Fluctuance: No Erythema: No Friable: No Hemosiderin Staining: No Induration: No Mottled: No Localized Edema: Yes Pallor: No Rash: No Rubor: No Scarring: No Temperature / Pain Moisture Temperature: No Abnormality No Abnormalities Noted: No Tenderness on Palpation:  Yes Dry / Scaly: No Maceration: No Moist: Yes Wound Preparation Ulcer Cleansing: Other: soap and water, Topical Anesthetic Applied: Other: lidocaine 4%, Treatment Notes Wound #2 (Right, Lateral Lower Leg) 1. Cleansed with: Cleanse wound with antibacterial soap and water 2. Anesthetic Topical Lidocaine 4% cream to wound bed prior to debridement 4. Dressing Applied: Promogran 5. Secondary Dressing Applied ABD Pad 7. Secured with Tape 4-Layer Compression System - Right Lower Extremity Electronic Signature(s) Signed: 02/29/2016 4:42:28 PM By: [name] Entered By: [name] on 02/28/2016 14:01:43 Lakeman, MINA BATDORF ([id number]) [place], [place]. ([id number]) -------------------------------------------------------------------------------- Wound Assessment Details Patient Name: [name], Kinisha C.  Date of Service: 02/28/2016 1:30 PM Medical Record Patient Account Number: [id number] Number: Treating RN: [name] 1935-04-26 (80 y.o. Other Clinician: Date of Birth/Sex: Female) Treating [name], [name] Primary Care Physician/Extender: Claudette Laws Physician: Referring Physician: [name] in Treatment: 14 Wound Status Wound Number: 4 Primary Skin Tear Etiology: Wound Location: Right Lower Leg - Proximal Wound Status: Open Wounding Event: Shear/Friction Comorbid Cataracts, Arrhythmia, Hypotension, Date Acquired: 02/28/2016 History: Osteoarthritis Weeks Of Treatment: 0 Clustered Wound: No Photos Photo Uploaded By: [name] on 02/29/2016 09:28:36 Wound Measurements Length: (cm) 0.3 % Reduction i Width: (cm) 0.3 % Reduction i Depth: (cm) 0.1 Epithelializa Area: (cm) 0.071 Tunneling: Volume: (cm) 0.007 Undermining: n Area: n Volume: tion: None No No Wound Description Classification: Partial Thickness Wound Margin: Distinct, outline attached Exudate Amount: Large Exudate Type: Serosanguineous Exudate  Color: red, brown Foul Odor After Cleansing: No Wound Bed Granulation Amount: Large (67-100%) Exposed Structure Granulation Quality: Red Fascia Exposed: No Grange, Imelda C. ([id number]) Necrotic Amount: None Present (0%) Fat Layer Exposed: No Tendon Exposed: No Muscle Exposed: No Joint Exposed: No Bone Exposed: No Limited to Skin Breakdown Periwound Skin Texture Texture Color No Abnormalities Noted: No No Abnormalities Noted: No Moisture Temperature / Pain No Abnormalities Noted: No Temperature: No Abnormality Moist: Yes Tenderness on Palpation: Yes Wound Preparation Ulcer Cleansing: Other: soap and water, Topical Anesthetic Applied: Other: lidocaine 4%, Treatment Notes Wound #4 (Right, Proximal Lower Leg) 1. Cleansed with: Cleanse wound with antibacterial soap and water 4. Dressing Applied: Calcium Alginate 5. Secondary Dressing Applied ABD Pad 7. Secured with Tape 4-Layer Compression System - Right Lower Extremity Electronic Signature(s) Signed: 02/29/2016 4:42:28 PM By: [name] Entered By: [name] on 02/28/2016 14:06:55 Ki, Kourtni Loletha Grayer ([id number]) -------------------------------------------------------------------------------- Wound Assessment Details Patient Name: [name], Allisson C. Date of Service: 02/28/2016 1:30 PM Medical Record Patient Account Number: [id number] Number: Treating RN: [name] 11/14/1934 (80 y.o. Other Clinician: Date of Birth/Sex: Female) Treating [name], [name] Primary Care Physician/Extender: Claudette Laws Physician: Referring Physician: [name] in Treatment: 14 Wound Status Wound Number: 5 Primary Skin Tear Etiology: Wound Location: Right Lower Leg - Distal Wound Status: Open Wounding Event: Shear/Friction Comorbid Cataracts, Arrhythmia, Hypotension, Date Acquired: 02/28/2016 History: Osteoarthritis Weeks Of Treatment: 0 Clustered Wound: No Photos Photo Uploaded By:  [name] on 02/29/2016 09:28:38 Wound Measurements Length: (cm) 1 % Reduction i Width: (cm) 1.2 % Reduction i Depth: (cm) 0.1 Epithelializa Area: (cm) 0.942 Tunneling: Volume: (cm) 0.094 Undermining: n Area: n Volume: tion: None No No Wound Description Classification: Partial Thickness Wound Margin: Distinct, outline attached Exudate Amount: Large Exudate Type: Serosanguineous Exudate Color: red, brown Foul Odor After Cleansing: No Wound Bed Granulation Amount: Large (67-100%) Exposed Structure Granulation Quality: Red Fascia Exposed: No Rief, Kimbly C. ([id number]) Necrotic Amount: None Present (0%) Fat Layer Exposed: No Tendon Exposed: No Muscle Exposed: No Joint Exposed: No Bone Exposed: No Limited to Skin Breakdown Periwound Skin Texture Texture Color No Abnormalities Noted: No No Abnormalities Noted: No Moisture Temperature / Pain No Abnormalities Noted: No Temperature: No Abnormality Moist: Yes Tenderness on Palpation: Yes Wound Preparation Ulcer Cleansing: Other: soap and water, Topical Anesthetic Applied: None Treatment Notes Wound #5 (Right, Distal Lower Leg) 1. Cleansed with: Cleanse wound with antibacterial soap and water 4. Dressing Applied: Calcium Alginate 5. Secondary Dressing Applied ABD Pad 7. Secured with Tape 4-Layer Compression System - Right Lower Extremity Electronic Signature(s) Signed: 02/29/2016 4:42:28 PM By: [name] Entered By: [name] on 02/28/2016  14:08:43 KAYLIEGH, JERZAK ([id number]) -------------------------------------------------------------------------------- Vitals Details Patient Name: JALEIGH, PROVAN. Date of Service: 02/28/2016 1:30 PM Medical Record Patient Account Number: [id number] Number: Treating RN: [name] Dec 08, 1934 (80 y.o. Other Clinician: Date of Birth/Sex: Female) Treating [name], [name] Primary Care Physician/Extender: Claudette Laws Physician: Referring Physician: [name] in Treatment: 14 Vital Signs Time Taken: 13:48 Temperature (F): 97.5 Height (in): 68 Pulse (bpm): 56 Weight (lbs): 178 Respiratory Rate (breaths/min): 18 Body Mass Index (BMI): 27.1 Blood Pressure (mmHg): 149/54 Reference Range: 80 - 120 mg / dl Electronic Signature(s) Signed: 02/29/2016 4:42:28 PM By: [name] Entered By: [name] on 02/28/2016 13:48:53

## 2016-03-01 NOTE — Progress Notes (Signed)
Carrie, [name] ([id number]) Visit Report for 02/28/2016 Chief Complaint Document Details Carrie, [name] Date of Service: 02/28/2016 1:30 PM Patient Name: C. Patient Account Number: [id number] Medical Record Treating RN: [name] [id number] Number: Other Clinician: 01-04-1935 (81 y.o. Treating Meia Emley Date of Birth/Sex: Female) Physician/Extender: G Primary Care [name] Physician: Referring Physician: [name] in Treatment: 14 Information Obtained from: Patient Chief Complaint Patient is here for review of bilateral lower extremity wounds dating back to December 2016 Electronic Signature(s) Signed: 02/28/2016 4:33:17 PM By: [name] MD Entered By: [name] on 02/28/2016 14:43:28 Keahey, [name] ([id number]) -------------------------------------------------------------------------------- HPI Details [name] Date of Service: 02/28/2016 1:30 PM Patient Name: C. Patient Account Number: [id number] Medical Record Treating RN: [name] [id number] Number: Other Clinician: [birth year] (81 y.o. Treating [name], [place] Date of Birth/Sex: Female) Physician/Extender: G [place] Physician: Referring Physician: [name] in Treatment: 14 History of Present Illness HPI Description: 11/22/15; this is Maczko is a 81 year old woman who lives at home on her own. According to the patient and her daughter was present she has had long-standing edema in her legs dating back many years. She also has a history of chronic systolic heart failure, atrial fibrillation and is status post mitral valve replacement. Last [facility] showed an ejection fraction of 40-45% she is on Lasix 60 mg a day and spironolactone 25 mg a day. Her current problem began in December around Christmas time she developed a small hematoma in the medial part of her left leg which rapidly expanded to a very  large hematoma that required surgical debridement. This situation was complicated by the fact that the patient is on long-standing Coumadin for mechanical heart valve. She went to the OR had this evacuated on January 4 /17. The wound has gradually improved however she has developed a small wounds around this area and more recently a wound on the right lateral leg. She is weeping edema fluid. The patient is already been to see vascular surgery. It was recommended that she wear Unna boots, she did not tolerate this due to pain in the left leg. She was then prescribed Juzo stockings and really can't get these on herself although truthfully there is probably too much edema for a Juzo stockings currently. She is not a diabetic and has no history of PAD or claudication that I could elicit. She does not use the external compression pumps reliably. She comes today with notes from her primary physician and Dr. Ronalee Belts both recommending various forms of compression but the patient does not really complied with them. Has been using the external compression pumps with some regularity but certainly not daily on the right leg and this has helped. I also note that her daughter tells me the history that she does not sleep in bed at home. She has a hospital bed but with her legs up she finds this painful so she sleeps in the couch sitting up with her legs dependent. 11/29/15; the patient is arrives today accompanied by her son. He expresses satisfaction that she is maintained the compression all week. 12/06/15; the patient has kept her Profore light wraps on, we have good edema control no major change in the wounds we have been using Aquacel 12/13/15; changed to RTD last week. One of the 3 wounds on her left medial leg is healed she has 2 remaining wounds here and one on the right lateral leg. 12/18/2015 -- the patient was at Dr.  Schnier's office today and he was seeing her for an arterial study. While the wrap was  being removed she had an inadvertent laceration of the left proximal anterior leg which bled quite profusely and a compression dressing was applied over this and the patient was here to get her Profore wraps done. I was asked to see the patient to make an assessment and treat appropriately. 12/26/15; the patient's injury on the left proximal leg and Steri-Strips removed after soaking. There is an open area here. The original wounds to still open on the right lateral and left medial leg. 01/03/16 patient's injury on her proximal left leg looks quite good. Still small open area on the medial left leg which appears to be improving. The area on the right lateral leg still substantially open with no real improvement in wound depth. Her edema control is marginal with a Profore light. We have been using RTD Jozwiak, Scout C. ([id number]) for 3-4 weeks without any major change 01/10/16: wounds without s/s of infection. vascular results are pending regarding arterial studies. 01/17/16; patient comes in today complaining of severe pain however I think most of this is in the right hip not related to her wounds. She continues with a oval-shaped wound on the right lateral leg, trauma to the left anterior leg just below her tibial plateau. She has a smaller eschar on the left anterior leg. She is being using Prisma however she informs [place] today that she is actually allergic to silver, nose this from a previous application at [facility] some years ago 01/24/16; edema is not so well controlled today, I think I reduced her to [place] bilaterally last week. The area which was a scissor injury on her left upper anterior lower leg is fully epithelialized. She only has a small open area remaining on the medial aspect of her left leg. The oval-shaped wound on the right mid lateral leg may be a bit smaller. Debrided of surface slough nonviable subcutaneous tissue. I had changed to collagen 2 weeks ago in an attempt to get  this to close 01/31/16 all the patient's wounds on her left leg give healed. We have good edema control with bilateral Profore lites which she has been compliant with. She still has the oval-shaped wound on the right lateral calf allergic even this appears to be slowly improving. The patient has juxtalite stockings at home. She states she thinks she can put these on. She also has external compression pumps at home although I think her compliance with this has not been good in the past. She complains today of edema in her thighs. Tells me she takes 40 mg of Lasix daily 02/21/16; only 1 small wound remains on the lateral aspect of the right calf. She is using Juzo stockings on the left leg although she complains about difficulty in applying them. She has external compression pumps at home 02/28/16; the small open wound on her right lateral calf is improved in terms of wound area. It appears that she has a wrap injury on the anterior aspect of the upper leg Electronic Signature(s) Signed: 02/28/2016 4:33:17 PM By: [name] MD Entered By: [name] on 02/28/2016 14:45:13 Korb, [name] ([id number]) -------------------------------------------------------------------------------- Physical Exam Details [name] Date of Service: 02/28/2016 1:30 PM Patient Name: C. Patient Account Number: [id number] Medical Record Treating RN: [name] [id number] Number: Other Clinician: 1934-11-01 (81 y.o. Treating Marshelle Bilger, [place] Date of Birth/Sex: Female) Physician/Extender: G Primary Care [name] Physician: Referring Physician: [name] in  Treatment: 14 Constitutional Sitting or standing Blood Pressure is within target range for patient.. Pulse regular and within target range for patient.Marland Kitchen Respirations regular, non-labored and within target range.. Temperature is normal and within the target range for the patient.. Cardiovascular Pedal pulses palpable and  strong bilaterally.. Notes Wound exam; the open area on the right lower extremity continues to look smaller. No debridement is required. She has to hemorrhagic blisters superiorly one is open when we took off the wound a wrap Electronic Signature(s) Signed: 02/28/2016 4:33:17 PM By: [name] MD Entered By: [name] on 02/28/2016 14:50:01 [name] ([id number]) -------------------------------------------------------------------------------- Physician Orders Details [name] Date of Service: 02/28/2016 1:30 PM Patient Name: C. Patient Account Number: [id number] Medical Record Treating RN: [name] [id number] Number: Other Clinician: Mar 12, 1935 (81 y.o. Treating Akelia Husted, [place] Date of Birth/Sex: Female) Physician/Extender: G Primary Care [name] Physician: Referring Physician: [name] in Treatment: 14 Verbal / Phone Orders: Yes Clinician: Carolyne Fiscal, Debi Read Back and Verified: Yes Diagnosis Coding Wound Cleansing Wound #2 Right,Lateral Lower Leg o Cleanse wound with mild soap and water o May shower with protection. Wound #4 Right,Proximal Lower Leg o Cleanse wound with mild soap and water o May shower with protection. Wound #5 Right,Distal Lower Leg o Cleanse wound with mild soap and water o May shower with protection. Anesthetic Wound #2 Right,Lateral Lower Leg o Topical Lidocaine 4% cream applied to wound bed prior to debridement - clinic use only Primary Wound Dressing Wound #2 Right,Lateral Lower Leg o Promogran Wound #4 Right,Proximal Lower Leg o Calcium Alginate Wound #5 Right,Distal Lower Leg o Calcium Alginate Secondary Dressing Wound #2 Right,Lateral Lower Leg o ABD pad Wound #4 Right,Proximal Lower Leg o ABD pad Wrench, Jossalin C. ([id number]) Wound #5 Right,Distal Lower Leg o ABD pad Dressing Change Frequency Wound #2 Right,Lateral Lower Leg o Change dressing every  week Wound #4 Right,Proximal Lower Leg o Change dressing every week Wound #5 Right,Distal Lower Leg o Change dressing every week Follow-up Appointments Wound #2 Right,Lateral Lower Leg o Return Appointment in 1 week. Wound #4 Right,Proximal Lower Leg o Return Appointment in 1 week. Wound #5 Right,Distal Lower Leg o Return Appointment in 1 week. Edema Control Wound #2 Right,Lateral Lower Leg o 4-Layer Compression System - Right Lower Extremity o Elevate legs to the level of the heart and pump ankles as often as possible Wound #4 Right,Proximal Lower Leg o 4-Layer Compression System - Right Lower Extremity o Elevate legs to the level of the heart and pump ankles as often as possible Wound #5 Right,Distal Lower Leg o 4-Layer Compression System - Right Lower Extremity o Elevate legs to the level of the heart and pump ankles as often as possible Additional Orders / Instructions Wound #2 Right,Lateral Lower Leg o Increase protein intake. Wound #4 Right,Proximal Lower Leg o Increase protein intake. Wound #5 Right,Distal Lower Leg o Increase protein intake. CHISOM, RAZZA ([id number]) Electronic Signature(s) Signed: 02/28/2016 4:33:17 PM By: [name] MD Signed: 02/29/2016 4:42:28 PM By: [name] Entered By: [name] on 02/28/2016 14:19:49 Clemon, [name] ([id number]) -------------------------------------------------------------------------------- Problem List Details [name] Date of Service: 02/28/2016 1:30 PM Patient Name: C. Patient Account Number: [id number] Medical Record Treating RN: [name] [id number] Number: Other Clinician: 1935/01/02 (81 y.o. Treating Khadar Monger Date of Birth/Sex: Female) Physician/Extender: G Primary Care [name] Physician: Referring Physician: [name] in Treatment: 14 Active Problems ICD-10 Encounter Code Description Active Date Diagnosis I87.333  Chronic venous hypertension (idiopathic) with ulcer  and 11/22/2015 Yes inflammation of bilateral lower extremity I89.0 Lymphedema, not elsewhere classified 11/22/2015 Yes [id number] Chronic systolic (congestive) heart failure 11/22/2015 Yes S81.812A Laceration without foreign body, left lower leg, initial 12/18/2015 Yes encounter Inactive Problems Resolved Problems Electronic Signature(s) Signed: 02/28/2016 4:33:17 PM By: [name] MD Entered By: [name] on 02/28/2016 14:41:54 Hearst, [name] ([id number]) -------------------------------------------------------------------------------- Progress Note Details [name] Date of Service: 02/28/2016 1:30 PM Patient Name: C. Patient Account Number: [id number] Medical Record Treating RN: [name] [id number] Number: Other Clinician: 09/27/1934 (81 y.o. Treating Jennise Both, [place] Date of Birth/Sex: Female) Physician/Extender: G Primary Care [name] Physician: Referring Physician: [name] in Treatment: 14 Subjective Chief Complaint Information obtained from Patient Patient is here for review of bilateral lower extremity wounds dating back to December 2016 History of Present Illness (HPI) 11/22/15; this is Chheng is a 81 year old woman who lives at home on her own. According to the patient and her daughter was present she has had long-standing edema in her legs dating back many years. She also has a history of chronic systolic heart failure, atrial fibrillation and is status post mitral valve replacement. Last echocardiogram I see [facility] showed an ejection fraction of 40-45% she is on Lasix 60 mg a day and spironolactone 25 mg a day. Her current problem began in December around Christmas time she developed a small hematoma in the medial part of her left leg which rapidly expanded to a very large hematoma that required surgical debridement. This situation was complicated by the fact that the  patient is on long-standing Coumadin for mechanical heart valve. She went to the OR had this evacuated on January 4 /17. The wound has gradually improved however she has developed a small wounds around this area and more recently a wound on the right lateral leg. She is weeping edema fluid. The patient is already been to see vascular surgery. It was recommended that she wear Unna boots, she did not tolerate this due to pain in the left leg. She was then prescribed Juzo stockings and really can't get these on herself although truthfully there is probably too much edema for a Juzo stockings currently. She is not a diabetic and has no history of PAD or claudication that I could elicit. She does not use the external compression pumps reliably. She comes today with notes from her primary physician and Dr. Ronalee Belts both recommending various forms of compression but the patient does not really complied with them. Has been using the external compression pumps with some regularity but certainly not daily on the right leg and this has helped. I also note that her daughter tells me the history that she does not sleep in bed at home. She has a hospital bed but with her legs up she finds this painful so she sleeps in the couch sitting up with her legs dependent. 11/29/15; the patient is arrives today accompanied by her son. He expresses satisfaction that she is maintained the compression all week. 12/06/15; the patient has kept her Profore light wraps on, we have good edema control no major change in the wounds we have been using Aquacel 12/13/15; changed to RTD last week. One of the 3 wounds on her left medial leg is healed she has 2 remaining wounds here and one on the right lateral leg. 12/18/2015 -- the patient was at Dr. Nino Parsley office today and he was seeing her for an arterial study. While the wrap was being removed she had an inadvertent  laceration of the left proximal anterior leg which bled quite  profusely and a compression dressing was applied over this and the patient was here to get her Profore wraps done. I was asked to see the patient to make an assessment and treat appropriately. BAYAH, WINGATE ([id number]) 12/26/15; the patient's injury on the left proximal leg and Steri-Strips removed after soaking. There is an open area here. The original wounds to still open on the right lateral and left medial leg. 01/03/16 patient's injury on her proximal left leg looks quite good. Still small open area on the medial left leg which appears to be improving. The area on the right lateral leg still substantially open with no real improvement in wound depth. Her edema control is marginal with a Profore light. We have been using RTD for 3-4 weeks without any major change 01/10/16: wounds without s/s of infection. vascular results are pending regarding arterial studies. 01/17/16; patient comes in today complaining of severe pain however I think most of this is in the right hip not related to her wounds. She continues with a oval-shaped wound on the right lateral leg, trauma to the left anterior leg just below her tibial plateau. She has a smaller eschar on the left anterior leg. She is being using Prisma however she informs [place] today that she is actually allergic to silver, nose this from a previous application at [facility] some years ago 01/24/16; edema is not so well controlled today, I think I reduced her to [place] bilaterally last week. The area which was a scissor injury on her left upper anterior lower leg is fully epithelialized. She only has a small open area remaining on the medial aspect of her left leg. The oval-shaped wound on the right mid lateral leg may be a bit smaller. Debrided of surface slough nonviable subcutaneous tissue. I had changed to collagen 2 weeks ago in an attempt to get this to close 01/31/16 all the patient's wounds on her left leg give healed. We have good edema  control with bilateral Profore lites which she has been compliant with. She still has the oval-shaped wound on the right lateral calf allergic even this appears to be slowly improving. The patient has juxtalite stockings at home. She states she thinks she can put these on. She also has external compression pumps at home although I think her compliance with this has not been good in the past. She complains today of edema in her thighs. Tells me she takes 40 mg of Lasix daily 02/21/16; only 1 small wound remains on the lateral aspect of the right calf. She is using Juzo stockings on the left leg although she complains about difficulty in applying them. She has external compression pumps at home 02/28/16; the small open wound on her right lateral calf is improved in terms of wound area. It appears that she has a wrap injury on the anterior aspect of the upper leg Objective Constitutional Sitting or standing Blood Pressure is within target range for patient.. Pulse regular and within target range for patient.Marland Kitchen Respirations regular, non-labored and within target range.. Temperature is normal and within the target range for the patient.. Vitals Time Taken: 1:48 PM, Height: 68 in, Weight: 178 lbs, BMI: 27.1, Temperature: 97.5 F, Pulse: 56 bpm, Respiratory Rate: 18 breaths/min, Blood Pressure: 149/54 mmHg. Cardiovascular Pedal pulses palpable and strong bilaterally.. General Notes: Wound exam; the open area on the right lower extremity continues to look smaller. No Dolin, Dietrich C. ([id number]) debridement  is required. She has to hemorrhagic blisters superiorly one is open when we took off the wound a wrap Integumentary (Hair, Skin) Wound #2 status is Open. Original cause of wound was Gradually Appeared. The wound is located on the Right,Lateral Lower Leg. The wound measures 0.7cm length x 0.4cm width x 0.2cm depth; 0.22cm^2 area and 0.044cm^3 volume. The wound is limited to skin breakdown.  There is no tunneling or undermining noted. There is a large amount of serosanguineous drainage noted. The wound margin is distinct with the outline attached to the wound base. There is large (67-100%) pink granulation within the wound bed. There is a small (1-33%) amount of necrotic tissue within the wound bed including Adherent Slough. The periwound skin appearance exhibited: Localized Edema, Moist. The periwound skin appearance did not exhibit: Callus, Crepitus, Excoriation, Fluctuance, Friable, Induration, Rash, Scarring, Dry/Scaly, Maceration, Atrophie Blanche, Cyanosis, Ecchymosis, Hemosiderin Staining, Mottled, Pallor, Rubor, Erythema. Periwound temperature was noted as No Abnormality. The periwound has tenderness on palpation. Wound #4 status is Open. Original cause of wound was Shear/Friction. The wound is located on the Right,Proximal Lower Leg. The wound measures 0.3cm length x 0.3cm width x 0.1cm depth; 0.071cm^2 area and 0.007cm^3 volume. The wound is limited to skin breakdown. There is no tunneling or undermining noted. There is a large amount of serosanguineous drainage noted. The wound margin is distinct with the outline attached to the wound base. There is large (67-100%) red granulation within the wound bed. There is no necrotic tissue within the wound bed. The periwound skin appearance exhibited: Moist. Periwound temperature was noted as No Abnormality. The periwound has tenderness on palpation. Wound #5 status is Open. Original cause of wound was Shear/Friction. The wound is located on the Right,Distal Lower Leg. The wound measures 1cm length x 1.2cm width x 0.1cm depth; 0.942cm^2 area and 0.094cm^3 volume. The wound is limited to skin breakdown. There is no tunneling or undermining noted. There is a large amount of serosanguineous drainage noted. The wound margin is distinct with the outline attached to the wound base. There is large (67-100%) red granulation within the wound  bed. There is no necrotic tissue within the wound bed. The periwound skin appearance exhibited: Moist. Periwound temperature was noted as No Abnormality. The periwound has tenderness on palpation. Assessment Active Problems ICD-10 I87.333 - Chronic venous hypertension (idiopathic) with ulcer and inflammation of bilateral lower extremity I89.0 - Lymphedema, not elsewhere classified [id number] - Chronic systolic (congestive) heart failure [id number] - Laceration without foreign body, left lower leg, initial encounter MADIHA, CISSEL. ([id number]) Plan Wound Cleansing: Wound #2 Right,Lateral Lower Leg: Cleanse wound with mild soap and water May shower with protection. Wound #4 Right,Proximal Lower Leg: Cleanse wound with mild soap and water May shower with protection. Wound #5 Right,Distal Lower Leg: Cleanse wound with mild soap and water May shower with protection. Anesthetic: Wound #2 Right,Lateral Lower Leg: Topical Lidocaine 4% cream applied to wound bed prior to debridement - clinic use only Primary Wound Dressing: Wound #2 Right,Lateral Lower Leg: Promogran Wound #4 Right,Proximal Lower Leg: Calcium Alginate Wound #5 Right,Distal Lower Leg: Calcium Alginate Secondary Dressing: Wound #2 Right,Lateral Lower Leg: ABD pad Wound #4 Right,Proximal Lower Leg: ABD pad Wound #5 Right,Distal Lower Leg: ABD pad Dressing Change Frequency: Wound #2 Right,Lateral Lower Leg: Change dressing every week Wound #4 Right,Proximal Lower Leg: Change dressing every week Wound #5 Right,Distal Lower Leg: Change dressing every week Follow-up Appointments: Wound #2 Right,Lateral Lower Leg: Return Appointment in 1 week. Wound #4 Right,Proximal  Lower Leg: Return Appointment in 1 week. Wound #5 Right,Distal Lower Leg: Return Appointment in 1 week. Edema Control: Wound #2 Right,Lateral Lower Leg: 4-Layer Compression System - Right Lower Extremity Elevate legs to the level of the heart and  pump ankles as often as possible Wound #4 Right,Proximal Lower Leg: 4-Layer Compression System - Right Lower Extremity Kamara, Mayling C. ([id number]) Elevate legs to the level of the heart and pump ankles as often as possible Wound #5 Right,Distal Lower Leg: 4-Layer Compression System - Right Lower Extremity Elevate legs to the level of the heart and pump ankles as often as possible Additional Orders / Instructions: Wound #2 Right,Lateral Lower Leg: Increase protein intake. Wound #4 Right,Proximal Lower Leg: Increase protein intake. Wound #5 Right,Distal Lower Leg: Increase protein intake. We applied calcium alginate to the wrap injured area. One hemorragic superior blister is open, one is closed Electronic Signature(s) Signed: 02/28/2016 4:33:17 PM By: [name] MD Entered By: [name] on 02/28/2016 14:52:44 Spangler, [name] ([id number]) -------------------------------------------------------------------------------- SuperBill Details Patient Name: [name] C. Date of Service: 02/28/2016 Medical Record Patient Account Number: [id number] Number: Treating RN: [name] [birth year] (80 y.o. 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 I87.333 extremity I89.0 Lymphedema, not elsewhere classified [id number] Chronic systolic (congestive) heart failure S81.812A Laceration without foreign body, left lower leg, initial encounter Facility Procedures CPT4: Description Modifier Quantity Code [id number] (Facility Use Only) [phone] - [place] RT 1 LEG Physician Procedures CPT4: Description Modifier Quantity Code [id number] - WC PHYS LEVEL 2 - EST PT 1 ICD-10  Description Diagnosis I87.333 Chronic venous hypertension (idiopathic) with ulcer and inflammation of bilateral lower extremity Electronic Signature(s) Signed: 02/28/2016 6:00:53 PM By: [name] MD Signed: 02/29/2016 4:42:28 PM By: [name] Previous Signature: 02/28/2016 4:33:17 PM Version By: [name] MD Entered By: [name] on 02/28/2016 16:39:25

## 2016-03-06 ENCOUNTER — Ambulatory Visit: Payer: Medicare Other

## 2016-03-06 ENCOUNTER — Encounter: Payer: Medicare Other | Attending: Cardiovascular Disease

## 2016-03-06 ENCOUNTER — Encounter: Payer: Medicare Other | Admitting: Internal Medicine

## 2016-03-06 DIAGNOSIS — I890 Lymphedema, not elsewhere classified: Secondary | ICD-10-CM | POA: Insufficient documentation

## 2016-03-06 DIAGNOSIS — I5042 Chronic combined systolic (congestive) and diastolic (congestive) heart failure: Secondary | ICD-10-CM | POA: Insufficient documentation

## 2016-03-06 DIAGNOSIS — Z9889 Other specified postprocedural states: Secondary | ICD-10-CM | POA: Diagnosis not present

## 2016-03-06 DIAGNOSIS — Z7901 Long term (current) use of anticoagulants: Secondary | ICD-10-CM | POA: Insufficient documentation

## 2016-03-06 DIAGNOSIS — Z79899 Other long term (current) drug therapy: Secondary | ICD-10-CM | POA: Insufficient documentation

## 2016-03-06 DIAGNOSIS — I87333 Chronic venous hypertension (idiopathic) with ulcer and inflammation of bilateral lower extremity: Secondary | ICD-10-CM | POA: Diagnosis not present

## 2016-03-07 ENCOUNTER — Ambulatory Visit: Payer: Medicare Other

## 2016-03-07 NOTE — Progress Notes (Signed)
[name], [name] ([id number]) Visit Report for 03/06/2016 Chief Complaint Document Details [name], [name] Date of Service: 03/06/2016 2:15 PM Patient Name: C. Patient Account Number: [id number] Medical Record Treating RN: [name] [id number] Number: Other Clinician: March 25, 1935 (81 y.o. Treating Illa Enlow Date of Birth/Sex: Female) Physician/Extender: G Primary Care [name] Physician: Referring Physician: [name] in Treatment: 15 Information Obtained from: Patient Chief Complaint Patient is here for review of bilateral lower extremity wounds dating back to December 2016 Electronic Signature(s) Signed: 03/07/2016 12:55:33 PM By: [name] MD Entered By: [name] on 03/06/2016 14:43:25 [name], [name] ([id number]) -------------------------------------------------------------------------------- HPI Details Carrie Mulligan Date of Service: 03/06/2016 2:15 PM Patient Name: C. Patient Account Number: [id number] Medical Record Treating RN: [name] [id number] Number: Other Clinician: 1934/09/02 (81 y.o. Treating Lakeva Hollon, [place] Date of Birth/Sex: Female) Physician/Extender: G [place] Physician: Referring Physician: [name] in Treatment: 15 History of Present Illness HPI Description: 11/22/15; this is [place] is a 81 year old woman who lives at home on her own. According to the patient and her daughter was present she has had long-standing edema in her legs dating back many years. She also has a history of chronic systolic heart failure, atrial fibrillation and is status post mitral valve replacement. Last echocardiogram I see in [facility] showed an ejection fraction of 40-45% she is on Lasix 60 mg a day and spironolactone 25 mg a day. Her current problem began in December around Christmas time she developed a small hematoma in the medial part of her left leg which rapidly expanded to a very  large hematoma that required surgical debridement. This situation was complicated by the fact that the patient is on long-standing Coumadin for mechanical heart valve. She went to the OR had this evacuated on January 4 /17. The wound has gradually improved however she has developed a small wounds around this area and more recently a wound on the right lateral leg. She is weeping edema fluid. The patient is already been to see vascular surgery. It was recommended that she wear Unna boots, she did not tolerate this due to pain in the left leg. She was then prescribed Juzo stockings and really can't get these on herself although truthfully there is probably too much edema for a Juzo stockings currently. She is not a diabetic and has no history of PAD or claudication that I could elicit. She does not use the external compression pumps reliably. She comes today with notes from her primary physician and Dr. Ronalee Belts both recommending various forms of compression but the patient does not really complied with them. Has been using the external compression pumps with some regularity but certainly not daily on the right leg and this has helped. I also note that her daughter tells me the history that she does not sleep in bed at home. She has a hospital bed but with her legs up she finds this painful so she sleeps in the couch sitting up with her legs dependent. 11/29/15; the patient is arrives today accompanied by her son. He expresses satisfaction that she is maintained the compression all week. 12/06/15; the patient has kept her Profore light wraps on, we have good edema control no major change in the wounds we have been using Aquacel 12/13/15; changed to RTD last week. One of the 3 wounds on her left medial leg is healed she has 2 remaining wounds here and one on the right lateral leg. 12/18/2015 -- the patient was at Dr.  Schnier's office today and he was seeing her for an arterial study. While the wrap was  being removed she had an inadvertent laceration of the left proximal anterior leg which bled quite profusely and a compression dressing was applied over this and the patient was here to get her Profore wraps done. I was asked to see the patient to make an assessment and treat appropriately. 12/26/15; the patient's injury on the left proximal leg and Steri-Strips removed after soaking. There is an open area here. The original wounds to still open on the right lateral and left medial leg. 01/03/16 patient's injury on her proximal left leg looks quite good. Still small open area on the medial left leg which appears to be improving. The area on the right lateral leg still substantially open with no real improvement in wound depth. Her edema control is marginal with a Profore light. We have been using RTD [name], [name] ([id number]) for 3-4 weeks without any major change 01/10/16: wounds without s/s of infection. vascular results are pending regarding arterial studies. 01/17/16; patient comes in today complaining of severe pain however I think most of this is in the right hip not related to her wounds. She continues with a oval-shaped wound on the right lateral leg, trauma to the left anterior leg just below her tibial plateau. She has a smaller eschar on the left anterior leg. She is being using Prisma however she informs [place] today that she is actually allergic to silver, nose this from a previous application at [facility] some years ago 01/24/16; edema is not so well controlled today, I think I reduced her to [place] bilaterally last week. The area which was a scissor injury on her left upper anterior lower leg is fully epithelialized. She only has a small open area remaining on the medial aspect of her left leg. The oval-shaped wound on the right mid lateral leg may be a bit smaller. Debrided of surface slough nonviable subcutaneous tissue. I had changed to collagen 2 weeks ago in an attempt to get  this to close 01/31/16 all the patient's wounds on her left leg give healed. We have good edema control with bilateral Profore lites which she has been compliant with. She still has the oval-shaped wound on the right lateral calf allergic even this appears to be slowly improving. The patient has juxtalite stockings at home. She states she thinks she can put these on. She also has external compression pumps at home although I think her compliance with this has not been good in the past. She complains today of edema in her thighs. Tells me she takes 40 mg of Lasix daily 02/21/16; only 1 small wound remains on the lateral aspect of the right calf. She is using Juzo stockings on the left leg although she complains about difficulty in applying them. She has external compression pumps at home 02/28/16; the small open wound on her right lateral calf is improved in terms of wound area. It appears that she has a wrap injury on the anterior aspect of the upper leg 03/06/16; the small open wound on her right lateral calf has a very small open area remaining. The wrap injury on the anterior aspect of the upper leg also appears better. She arrives today in clinic with a history of dyspnea with minimal exertion starting with the last 2-4 days. She does not describe chest pain. Her son and our intake nurse think she has facial swelling. She has a history of an  artificial mitral valve on Coumadin Electronic Signature(s) Signed: 03/07/2016 12:55:33 PM By: [name] MD Entered By: [name] on 03/06/2016 14:44:41 [name], [name] ([id number]) -------------------------------------------------------------------------------- Physical Exam Details Carrie Mulligan Date of Service: 03/06/2016 2:15 PM Patient Name: C. Patient Account Number: [id number] Medical Record Treating RN: [name] [id number] Number: Other Clinician: [birth year] (81 y.o. Treating Miranda Frese, [place] Date of Birth/Sex: Female)  Physician/Extender: G Primary Care [name] Physician: Referring Physician: [name] in Treatment: 15 Constitutional Patient is hypertensive.. Pulse regular and within target range for patient.Marland Kitchen Respirations regular, non-labored and within target range.. Temperature is normal and within the target range for the patient.Marland Kitchen Appears somewhat dyspneic. Eyes Conjunctivae clear. No discharge.Marland Kitchen Respiratory Above normal respiratory effort noted. Respiratory rate elveaated. I basilar crackles. Cardiovascular Mechanical S1 no S3 JVP is not elevated. Some edema in her left leg [has a stocking on]. Gastrointestinal (GI) Abdomen is soft and non-distended without masses or tenderness. Bowel sounds active in all quadrants.. No liver or spleen enlargement or tenderness.Marland Kitchen Psychiatric No evidence of depression, anxiety, or agitation. Calm, cooperative, and communicative. Appropriate interactions and affect.. Notes Wound exam; the open area on the right lower extremity continues to looks smaller on the lateral aspect. Wrapped injury from last week also appears improved just below her tibial plateau Electronic Signature(s) Signed: 03/07/2016 12:55:33 PM By: [name] MD Entered By: [name] on 03/06/2016 14:47:35 [name], [name] ([id number]) -------------------------------------------------------------------------------- Physician Orders Details Carrie Mulligan Date of Service: 03/06/2016 2:15 PM Patient Name: C. Patient Account Number: [id number] Medical Record Treating RN: [name] [id number] Number: Other Clinician: June 20, 1935 (81 y.o. Treating Ilissa Rosner, [place] Date of Birth/Sex: Female) Physician/Extender: G Primary Care [name] Physician: Referring Physician: [name] in Treatment: 15 Verbal / Phone Orders: Yes Clinician: Carolyne Fiscal, Debi Read Back and Verified: Yes Diagnosis Coding Wound Cleansing Wound #2 Right,Lateral Lower Leg o  Cleanse wound with mild soap and water o May shower with protection. Wound #4 Right,Proximal Lower Leg o Cleanse wound with mild soap and water o May shower with protection. Wound #5 Right,Distal Lower Leg o Cleanse wound with mild soap and water o May shower with protection. Anesthetic Wound #2 Right,Lateral Lower Leg o Topical Lidocaine 4% cream applied to wound bed prior to debridement - clinic use only Primary Wound Dressing Wound #2 Right,Lateral Lower Leg o Promogran Wound #4 Right,Proximal Lower Leg o Calcium Alginate Wound #5 Right,Distal Lower Leg o Calcium Alginate Secondary Dressing Wound #2 Right,Lateral Lower Leg o ABD pad Wound #4 Right,Proximal Lower Leg o ABD pad [place], [name] ([id number]) Wound #5 Right,Distal Lower Leg o ABD pad Dressing Change Frequency Wound #2 Right,Lateral Lower Leg o Change dressing every week Wound #4 Right,Proximal Lower Leg o Change dressing every week Wound #5 Right,Distal Lower Leg o Change dressing every week Follow-up Appointments Wound #2 Right,Lateral Lower Leg o Return Appointment in 1 week. Wound #4 Right,Proximal Lower Leg o Return Appointment in 1 week. Wound #5 Right,Distal Lower Leg o Return Appointment in 1 week. Edema Control Wound #2 Right,Lateral Lower Leg o 4-Layer Compression System - Right Lower Extremity o Elevate legs to the level of the heart and pump ankles as often as possible Wound #4 Right,Proximal Lower Leg o 4-Layer Compression System - Right Lower Extremity o Elevate legs to the level of the heart and pump ankles as often as possible Wound #5 Right,Distal Lower Leg o 4-Layer Compression System - Right Lower Extremity o Elevate legs to the level of the heart and pump ankles  as often as possible Additional Orders / Instructions Wound #2 Right,Lateral Lower Leg o Increase protein intake. Wound #4 Right,Proximal Lower Leg o Increase  protein intake. Wound #5 Right,Distal Lower Leg o Increase protein intake. [name], [name] ([id number]) Electronic Signature(s) Signed: 03/06/2016 5:27:01 PM By: [name] Signed: 03/07/2016 12:55:33 PM By: [name] MD Entered By: [name] on 03/06/2016 14:46:13 [name], [name] ([id number]) -------------------------------------------------------------------------------- Problem List Details Carrie Mulligan Date of Service: 03/06/2016 2:15 PM Patient Name: C. Patient Account Number: [id number] Medical Record Treating RN: [name] [id number] Number: Other Clinician: Jul 30, 1934 (81 y.o. Treating Taleeyah Bora Date of Birth/Sex: Female) Physician/Extender: G Primary Care [name] Physician: Referring Physician: [name] in Treatment: 15 Active Problems ICD-10 Encounter Code Description Active Date Diagnosis I87.333 Chronic venous hypertension (idiopathic) with ulcer and 11/22/2015 Yes inflammation of bilateral lower extremity I89.0 Lymphedema, not elsewhere classified 11/22/2015 Yes D32.20 Chronic systolic (congestive) heart failure 11/22/2015 Yes S81.812A Laceration without foreign body, left lower leg, initial 12/18/2015 Yes encounter Inactive Problems Resolved Problems Electronic Signature(s) Signed: 03/07/2016 12:55:33 PM By: [name] MD Entered By: [name] on 03/06/2016 14:43:02 [name], [name] ([id number]) -------------------------------------------------------------------------------- Progress Note Details Carrie Mulligan Date of Service: 03/06/2016 2:15 PM Patient Name: C. Patient Account Number: [id number] Medical Record Treating RN: [name] [id number] Number: Other Clinician: 11/11/1934 (80 y.o. Treating Tye Vigo, [place] Date of Birth/Sex: Female) Physician/Extender: G Primary Care [name] Physician: Referring Physician: [name] in Treatment: 15 Subjective Chief  Complaint Information obtained from Patient Patient is here for review of bilateral lower extremity wounds dating back to December 2016 History of Present Illness (HPI) 11/22/15; this is [place] is a 81 year old woman who lives at home on her own. According to the patient and her daughter was present she has had long-standing edema in her legs dating back many years. She also has a history of chronic systolic heart failure, atrial fibrillation and is status post mitral valve replacement. Last echocardiogram I see in [facility] showed an ejection fraction of 40-45% she is on Lasix 60 mg a day and spironolactone 25 mg a day. Her current problem began in December around Christmas time she developed a small hematoma in the medial part of her left leg which rapidly expanded to a very large hematoma that required surgical debridement. This situation was complicated by the fact that the patient is on long-standing Coumadin for mechanical heart valve. She went to the OR had this evacuated on January 4 /17. The wound has gradually improved however she has developed a small wounds around this area and more recently a wound on the right lateral leg. She is weeping edema fluid. The patient is already been to see vascular surgery. It was recommended that she wear Unna boots, she did not tolerate this due to pain in the left leg. She was then prescribed Juzo stockings and really can't get these on herself although truthfully there is probably too much edema for a Juzo stockings currently. She is not a diabetic and has no history of PAD or claudication that I could elicit. She does not use the external compression pumps reliably. She comes today with notes from her primary physician and Dr. Ronalee Belts both recommending various forms of compression but the patient does not really complied with them. Has been using the external compression pumps with some regularity but certainly not daily on the right leg and  this has helped. I also note that her daughter tells me the history that she does  not sleep in bed at home. She has a hospital bed but with her legs up she finds this painful so she sleeps in the couch sitting up with her legs dependent. 11/29/15; the patient is arrives today accompanied by her son. He expresses satisfaction that she is maintained the compression all week. 12/06/15; the patient has kept her Profore light wraps on, we have good edema control no major change in the wounds we have been using Aquacel 12/13/15; changed to RTD last week. One of the 3 wounds on her left medial leg is healed she has 2 remaining wounds here and one on the right lateral leg. 12/18/2015 -- the patient was at Dr. Nino Parsley office today and he was seeing her for an arterial study. While the wrap was being removed she had an inadvertent laceration of the left proximal anterior leg which bled quite profusely and a compression dressing was applied over this and the patient was here to get her Profore wraps done. I was asked to see the patient to make an assessment and treat appropriately. [name], [name] ([id number]) 12/26/15; the patient's injury on the left proximal leg and Steri-Strips removed after soaking. There is an open area here. The original wounds to still open on the right lateral and left medial leg. 01/03/16 patient's injury on her proximal left leg looks quite good. Still small open area on the medial left leg which appears to be improving. The area on the right lateral leg still substantially open with no real improvement in wound depth. Her edema control is marginal with a Profore light. We have been using RTD for 3-4 weeks without any major change 01/10/16: wounds without s/s of infection. vascular results are pending regarding arterial studies. 01/17/16; patient comes in today complaining of severe pain however I think most of this is in the right hip not related to her wounds. She continues with  a oval-shaped wound on the right lateral leg, trauma to the left anterior leg just below her tibial plateau. She has a smaller eschar on the left anterior leg. She is being using Prisma however she informs [place] today that she is actually allergic to silver, nose this from a previous application at [facility] some years ago 01/24/16; edema is not so well controlled today, I think I reduced her to [place] bilaterally last week. The area which was a scissor injury on her left upper anterior lower leg is fully epithelialized. She only has a small open area remaining on the medial aspect of her left leg. The oval-shaped wound on the right mid lateral leg may be a bit smaller. Debrided of surface slough nonviable subcutaneous tissue. I had changed to collagen 2 weeks ago in an attempt to get this to close 01/31/16 all the patient's wounds on her left leg give healed. We have good edema control with bilateral Profore lites which she has been compliant with. She still has the oval-shaped wound on the right lateral calf allergic even this appears to be slowly improving. The patient has juxtalite stockings at home. She states she thinks she can put these on. She also has external compression pumps at home although I think her compliance with this has not been good in the past. She complains today of edema in her thighs. Tells me she takes 40 mg of Lasix daily 02/21/16; only 1 small wound remains on the lateral aspect of the right calf. She is using Juzo stockings on the left leg although she complains about difficulty  in applying them. She has external compression pumps at home 02/28/16; the small open wound on her right lateral calf is improved in terms of wound area. It appears that she has a wrap injury on the anterior aspect of the upper leg 03/06/16; the small open wound on her right lateral calf has a very small open area remaining. The wrap injury on the anterior aspect of the upper leg also appears better.  She arrives today in clinic with a history of dyspnea with minimal exertion starting with the last 2-4 days. She does not describe chest pain. Her son and our intake nurse think she has facial swelling. She has a history of an artificial mitral valve on Coumadin Objective Constitutional Patient is hypertensive.. Pulse regular and within target range for patient.Marland Kitchen Respirations regular, non-labored and within target range.. Temperature is normal and within the target range for the patient.Marland Kitchen Appears somewhat dyspneic. Vitals Time Taken: 2:18 PM, Height: 68 in, Weight: 178 lbs, BMI: 27.1, Pulse: 56 bpm, Respiratory Rate: 22 breaths/min, Blood Pressure: 165/62 mmHg, Pulse Oximetry: 90 %. General Notes: sats 71 made Dr. Dellia Nims aware [name], [name] ([id number]) Eyes Conjunctivae clear. No discharge.Marland Kitchen Respiratory Above normal respiratory effort noted. Respiratory rate elveaated. I basilar crackles. Cardiovascular Mechanical S1 no S3 JVP is not elevated. Some edema in her left leg [has a stocking on]. Gastrointestinal (GI) Abdomen is soft and non-distended without masses or tenderness. Bowel sounds active in all quadrants.. No liver or spleen enlargement or tenderness.Marland Kitchen Psychiatric No evidence of depression, anxiety, or agitation. Calm, cooperative, and communicative. Appropriate interactions and affect.. General Notes: Wound exam; the open area on the right lower extremity continues to looks smaller on the lateral aspect. Wrapped injury from last week also appears improved just below her tibial plateau Integumentary (Hair, Skin) Wound #2 status is Open. Original cause of wound was Gradually Appeared. The wound is located on the Right,Lateral Lower Leg. The wound measures 0.1cm length x 0.1cm width x 0.1cm depth; 0.008cm^2 area and 0.001cm^3 volume. The wound is limited to skin breakdown. There is no tunneling or undermining noted. There is a small amount of serous drainage noted. The  wound margin is distinct with the outline attached to the wound base. There is large (67-100%) pink granulation within the wound bed. There is no necrotic tissue within the wound bed. The periwound skin appearance exhibited: Localized Edema, Moist. The periwound skin appearance did not exhibit: Callus, Crepitus, Excoriation, Fluctuance, Friable, Induration, Rash, Scarring, Dry/Scaly, Maceration, Atrophie Blanche, Cyanosis, Ecchymosis, Hemosiderin Staining, Mottled, Pallor, Rubor, Erythema. Periwound temperature was noted as No Abnormality. The periwound has tenderness on palpation. Wound #4 status is Open. Original cause of wound was Shear/Friction. The wound is located on the Right,Proximal Lower Leg. The wound measures 0.2cm length x 0.3cm width x 0.1cm depth; 0.047cm^2 area and 0.005cm^3 volume. The wound is limited to skin breakdown. There is no tunneling or undermining noted. There is a large amount of serosanguineous drainage noted. The wound margin is distinct with the outline attached to the wound base. There is large (67-100%) red granulation within the wound bed. There is no necrotic tissue within the wound bed. The periwound skin appearance exhibited: Moist. Periwound temperature was noted as No Abnormality. The periwound has tenderness on palpation. Wound #5 status is Open. Original cause of wound was Shear/Friction. The wound is located on the Right,Distal Lower Leg. The wound measures 0.5cm length x 1.3cm width x 0.1cm depth; 0.511cm^2 area and 0.051cm^3 volume. The wound is limited to  skin breakdown. There is no tunneling or undermining noted. There is a large amount of serosanguineous drainage noted. The wound margin is distinct with the outline attached to the wound base. There is large (67-100%) red granulation within the wound bed. There is no necrotic tissue within the wound bed. The periwound skin appearance exhibited: Moist. Periwound temperature was noted as No Abnormality.  The periwound has tenderness on palpation. TYREKA, HENNEKE ([id number]) Assessment Active Problems ICD-10 I87.333 - Chronic venous hypertension (idiopathic) with ulcer and inflammation of bilateral lower extremity I89.0 - Lymphedema, not elsewhere classified Q76.19 - Chronic systolic (congestive) heart failure J09.326Z - Laceration without foreign body, left lower leg, initial encounter Plan Wound Cleansing: Wound #2 Right,Lateral Lower Leg: Cleanse wound with mild soap and water May shower with protection. Wound #4 Right,Proximal Lower Leg: Cleanse wound with mild soap and water May shower with protection. Wound #5 Right,Distal Lower Leg: Cleanse wound with mild soap and water May shower with protection. Anesthetic: Wound #2 Right,Lateral Lower Leg: Topical Lidocaine 4% cream applied to wound bed prior to debridement - clinic use only Primary Wound Dressing: Wound #2 Right,Lateral Lower Leg: Promogran Wound #4 Right,Proximal Lower Leg: Calcium Alginate Wound #5 Right,Distal Lower Leg: Calcium Alginate Secondary Dressing: Wound #2 Right,Lateral Lower Leg: ABD pad Wound #4 Right,Proximal Lower Leg: ABD pad Wound #5 Right,Distal Lower Leg: ABD pad Dressing Change Frequency: Wound #2 Right,Lateral Lower Leg: Change dressing every week Wound #4 Right,Proximal Lower Leg: Hankins, Jadalee C. ([id number]) Change dressing every week Wound #5 Right,Distal Lower Leg: Change dressing every week Follow-up Appointments: Wound #2 Right,Lateral Lower Leg: Return Appointment in 1 week. Wound #4 Right,Proximal Lower Leg: Return Appointment in 1 week. Wound #5 Right,Distal Lower Leg: Return Appointment in 1 week. Edema Control: Wound #2 Right,Lateral Lower Leg: 4-Layer Compression System - Right Lower Extremity Elevate legs to the level of the heart and pump ankles as often as possible Wound #4 Right,Proximal Lower Leg: 4-Layer Compression System - Right Lower  Extremity Elevate legs to the level of the heart and pump ankles as often as possible Wound #5 Right,Distal Lower Leg: 4-Layer Compression System - Right Lower Extremity Elevate legs to the level of the heart and pump ankles as often as possible Additional Orders / Instructions: Wound #2 Right,Lateral Lower Leg: Increase protein intake. Wound #4 Right,Proximal Lower Leg: Increase protein intake. Wound #5 Right,Distal Lower Leg: Increase protein intake. 1 silver alginate to right lateral leg 2 oCHF we have arranged for her to see Dr. Sabra Heck at [place] Signature(s) Signed: 03/07/2016 12:55:33 PM By: [name] MD Entered By: [name] on 03/06/2016 14:51:47 Serna, [name] ([id number]) -------------------------------------------------------------------------------- SuperBill Details Patient Name: Carrie Mulligan C. Date of Service: 03/06/2016 Medical Record Patient Account Number: [id number] Number: Treating RN: [name] 11/14/1934 (80 y.o. 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 I87.333 extremity I89.0 Lymphedema, not elsewhere classified T02.40 Chronic systolic (congestive) heart failure X73.532D Laceration without foreign body, left lower leg, initial encounter Facility Procedures CPT4 Code: [id number] Description: 99214 - WOUND CARE VISIT-LEV 4 EST PT Modifier: Quantity: 1 Physician Procedures CPT4: Description Modifier Quantity Code [id number] 99213 - WC PHYS LEVEL 3 - EST PT 1 ICD-10 Description Diagnosis I87.333 Chronic venous hypertension (idiopathic) with ulcer and inflammation of bilateral lower extremity Electronic Signature(s) Signed:  03/06/2016 5:27:01 PM By: [name] Signed: 03/07/2016 12:55:33 PM By: [name] MD Entered By: [name] on 03/06/2016 16:18:58

## 2016-03-07 NOTE — Progress Notes (Signed)
JAZYAH, BUTSCH ([id number]) Visit Report for 03/06/2016 Arrival Information Details Patient Name: NARCISSUS, DETWILER. Date of Service: 03/06/2016 2:15 PM Medical Record Patient Account Number: [id number] Number: Treating RN: [name] [name] 24, 1935 (81 y.o. Other Clinician: Date of Birth/Sex: Female) Treating [name], [name] Primary Care Physician/Extender: Claudette Laws Physician: Referring Physician: [name] in Treatment: 15 Visit Information History Since Last Visit All ordered tests and consults were completed: No Patient Arrived: Wheel Chair Added or deleted any medications: No Arrival Time: 14:17 Any new allergies or adverse reactions: No Accompanied By: son Had a fall or experienced change in No Transfer Assistance: EasyPivot Patient activities of daily living that may affect Lift risk of falls: Patient Identification Verified: Yes Signs or symptoms of abuse/neglect since last No Secondary Verification Process Yes visito Completed: Hospitalized since last visit: No Patient Requires Transmission- No Pain Present Now: No Based Precautions: Patient Has Alerts: Yes Patient Alerts: Patient on Blood Thinner [place] Signature(s) Signed: 03/06/2016 5:27:01 PM By: [name] Entered By: [name] on 03/06/2016 14:17:30 Hallgren, Joeanne Loletha Grayer ([id number]) -------------------------------------------------------------------------------- Clinic Level of Care Assessment Details Patient Name: [name] Date of Service: 03/06/2016 2:15 PM Medical Record Patient Account Number: [id number] Number: Treating RN: [name] 1935-05-14 (81 y.o. Other Clinician: Date of Birth/Sex: Female) Treating [name], [name] Primary Care Physician/Extender: Claudette Laws Physician: Referring Physician: [name] in Treatment: 15 Clinic Level of Care Assessment Items TOOL 4  Quantity Score X - Use when only an EandM is performed on FOLLOW-UP visit 1 0 ASSESSMENTS - Nursing Assessment / Reassessment X - Reassessment of Co-morbidities (includes updates in patient status) 1 10 X - Reassessment of Adherence to Treatment Plan 1 5 ASSESSMENTS - Wound and Skin Assessment / Reassessment []  - Simple Wound Assessment / Reassessment - one wound 0 X - Complex Wound Assessment / Reassessment - multiple wounds 3 5 []  - Dermatologic / Skin Assessment (not related to wound area) 0 ASSESSMENTS - Focused Assessment X - Circumferential Edema Measurements - multi extremities 1 5 []  - Nutritional Assessment / Counseling / Intervention 0 []  - Lower Extremity Assessment (monofilament, tuning fork, pulses) 0 []  - Peripheral Arterial Disease Assessment (using hand held doppler) 0 ASSESSMENTS - Ostomy and/or Continence Assessment and Care []  - Incontinence Assessment and Management 0 []  - Ostomy Care Assessment and Management (repouching, etc.) 0 PROCESS - Coordination of Care []  - Simple Patient / Family Education for ongoing care 0 X - Complex (extensive) Patient / Family Education for ongoing care 1 20 X - Staff obtains Consents, Records, Test Results / Process Orders 1 10 Molzahn, Ozetta C. ([id number]) []  - Staff telephones HHA, Nursing Homes / Clarify orders / etc 0 X - Routine Transfer to another Facility (non-emergent condition) 1 10 []  - Routine Hospital Admission (non-emergent condition) 0 []  - New Admissions / Biomedical engineer / Ordering NPWT, Apligraf, etc. 0 []  - Emergency Hospital Admission (emergent condition) 0 X - Simple Discharge Coordination 1 10 []  - Complex (extensive) Discharge Coordination 0 PROCESS - Special Needs []  - Pediatric / Minor Patient Management 0 []  - Isolation Patient Management 0 []  - Hearing / Language / Visual special needs 0 []  - Assessment of Community assistance (transportation, D/C planning, etc.) 0 []  - Additional assistance /  Altered mentation 0 []  - Support Surface(s) Assessment (bed, cushion, seat, etc.) 0 INTERVENTIONS - Wound Cleansing / Measurement []  - Simple Wound Cleansing - one wound 0 X - Complex Wound Cleansing -  multiple wounds 1 5 X - Wound Imaging (photographs - any number of wounds) 1 5 []  - Wound Tracing (instead of photographs) 0 X - Simple Wound Measurement - one wound 1 5 []  - Complex Wound Measurement - multiple wounds 0 INTERVENTIONS - Wound Dressings []  - Small Wound Dressing one or multiple wounds 0 []  - Medium Wound Dressing one or multiple wounds 0 X - Large Wound Dressing one or multiple wounds 1 20 X - Application of Medications - topical 1 5 []  - Application of Medications - injection 0 Criger, Amera C. ([id number]) INTERVENTIONS - Miscellaneous []  - External ear exam 0 []  - Specimen Collection (cultures, biopsies, blood, body fluids, etc.) 0 []  - Specimen(s) / Culture(s) sent or taken to Lab for analysis 0 []  - Patient Transfer (multiple staff / Harrel Lemon Lift / Similar devices) 0 []  - Simple Staple / Suture removal (25 or less) 0 []  - Complex Staple / Suture removal (26 or more) 0 []  - Hypo / Hyperglycemic Management (close monitor of Blood Glucose) 0 []  - Ankle / Brachial Index (ABI) - do not check if billed separately 0 X - Vital Signs 1 5 Has the patient been seen at the hospital within the last three years: Yes Total Score: 130 Level Of Care: New/Established - Level 4 Electronic Signature(s) Signed: 03/06/2016 5:27:01 PM By: [name] Entered By: [name] on 03/06/2016 16:18:51 Oyama, Tracye Loletha Grayer ([id number]) -------------------------------------------------------------------------------- Encounter Discharge Information Details Patient Name: [name] Date of Service: 03/06/2016 2:15 PM Medical Record Patient Account Number: [id number] Number: Treating RN: [name] 1935-06-23 (81 y.o. Other Clinician: Date of  Birth/Sex: Female) Treating [name], [name] Primary Care Physician/Extender: Claudette Laws Physician: Referring Physician: [name] in Treatment: 15 Encounter Discharge Information Items Discharge Pain Level: 0 Discharge Condition: Stable Ambulatory Status: Wheelchair Other (Note Discharge Destination: Required) Transportation: Private Auto Accompanied By: son Schedule Follow-up Appointment: Yes Medication Reconciliation completed and provided to Patient/Care Yes Jervis Trapani: Provided on Clinical Summary of Care: 03/06/2016 Form Type Recipient Paper Patient [place] Electronic Signature(s) Signed: 03/06/2016 2:49:08 PM By: Ruthine Dose Entered By: Ruthine Dose on 03/06/2016 14:49:08 Pangle, [name] ([id number]) -------------------------------------------------------------------------------- General Visit Notes Details Patient Name: [name] Date of Service: 03/06/2016 2:15 PM Medical Record Patient Account Number: [id number] Number: Treating RN: [name] 31-Oct-1934 (80 y.o. Other Clinician: Date of Birth/Sex: Female) Treating [name], [name] Primary Care Physician/Extender: Claudette Laws Physician: Referring Physician: [name] in Treatment: 15 Notes Pt came in for her apt today and she was in a wheelchair which she always walks in with her walker. She states she is having SOB and has had it for 4 days. I noticed her face and her left leg had edema. I checked her vitals and her O2 sats and her O2 sats were 90%, her voice was hoarse and she said she was not feeling well. I immediately let Dr. Dellia Nims know what was going on. He came in the room and assessed her. Pt was sent over to her primary care doctor for further evaluation. After I dressed her wounds and rewrapped her right leg. Electronic Signature(s) Signed: 03/06/2016 5:27:01 PM By: [name] Previous Signature: 03/06/2016 3:08:12 PM Version By: [name] Entered By: [name] on 03/06/2016 15:08:16 Totzke, Raeshawn Loletha Grayer ([id number]) -------------------------------------------------------------------------------- Lower Extremity Assessment Details Patient Name: Kron, Swayzie C. Date of Service: 03/06/2016 2:15 PM Medical Record Patient Account Number: [id number] Number: Treating RN: [name] [birth year] (80 y.o. Other Clinician:  Date of Birth/Sex: Female) Treating [name], [name] Primary Care Physician/Extender: Claudette Laws Physician: Referring Physician: [name] in Treatment: 15 Edema Assessment Assessed: [Left: No] [Right: No] E[Left: dema] [Right: :] Calf Left: Right: Point of Measurement: 36 cm From Medial Instep cm 32 cm Ankle Left: Right: Point of Measurement: 10 cm From Medial Instep cm 20.2 cm Vascular Assessment Pulses: Posterior Tibial Dorsalis Pedis Palpable: [Right:Yes] Extremity colors, hair growth, and conditions: Extremity Color: [Right:Mottled] Temperature of Extremity: [Right:Warm] Capillary Refill: [Right:< 3 seconds] Electronic Signature(s) Signed: 03/06/2016 5:27:01 PM By: [name] Entered By: [name] on 03/06/2016 14:30:07 Harvill, [name] ([id number]) -------------------------------------------------------------------------------- Multi Wound Chart Details Patient Name: [name] Date of Service: 03/06/2016 2:15 PM Medical Record Patient Account Number: [id number] Number: Treating RN: [name] Dec 15, 1934 (80 y.o. Other Clinician: Date of Birth/Sex: Female) Treating [name], [name] Primary Care Physician/Extender: Claudette Laws Physician: Referring Physician: [name] in Treatment: 15 Vital Signs Height(in): 68 Pulse(bpm): 56 Weight(lbs): 178 Blood Pressure 165/62 (mmHg): Body Mass Index(BMI): 27 Temperature(F): Respiratory Rate 22 (breaths/min): Photos: [2:No Photos] [4:No Photos]  [5:No Photos] Wound Location: [2:Right Lower Leg - Lateral Right Lower Leg -] [4:Proximal] [5:Right Lower Leg - Distal] Wounding Event: [2:Gradually Appeared] [4:Shear/Friction] [5:Shear/Friction] Primary Etiology: [2:Lymphedema] [4:Skin Tear] [5:Skin Tear] Comorbid History: [2:Cataracts, Arrhythmia, Hypotension, Osteoarthritis] [4:Cataracts, Arrhythmia, Hypotension, Osteoarthritis] [5:Cataracts, Arrhythmia, Hypotension, Osteoarthritis] Date Acquired: [2:07/04/2015] [4:02/28/2016] [5:02/28/2016] Weeks of Treatment: [2:15] [4:1] [5:1] Wound Status: [2:Open] [4:Open] [5:Open] Measurements L x W x D 0.1x0.1x0.1 [4:0.2x0.3x0.1] [5:0.5x1.3x0.1] (cm) Area (cm) : [2:0.008] [4:0.047] [5:0.511] Volume (cm) : [2:0.001] [4:0.005] [5:0.051] % Reduction in Area: [2:99.40%] [4:33.80%] [5:45.80%] % Reduction in Volume: 99.30% [4:28.60%] [5:45.70%] Classification: [2:Partial Thickness] [4:Partial Thickness] [5:Partial Thickness] Exudate Amount: [2:Small] [4:Large] [5:Large] Exudate Type: [2:Serous] [4:Serosanguineous] [5:Serosanguineous] Exudate Color: [2:amber] [4:red, brown] [5:red, brown] Wound Margin: [2:Distinct, outline attached Distinct, outline attached] [5:Distinct, outline attached] Granulation Amount: [2:Large (67-100%)] [4:Large (67-100%)] [5:Large (67-100%)] Granulation Quality: [2:Pink] [4:Red] [5:Red] Necrotic Amount: [2:None Present (0%)] [4:None Present (0%)] [5:None Present (0%)] Exposed Structures: [2:Fascia: No Fat: No] [4:Fascia: No Fat: No] [5:Fascia: No Fat: No] Tendon: No Tendon: No Tendon: No Muscle: No Muscle: No Muscle: No Joint: No Joint: No Joint: No Bone: No Bone: No Bone: No Limited to Skin Limited to Skin Limited to Skin Breakdown Breakdown Breakdown Epithelialization: None None None Periwound Skin Texture: Edema: Yes No Abnormalities Noted No Abnormalities Noted Excoriation: No Induration: No Callus: No Crepitus: No Fluctuance: No Friable: No Rash:  No Scarring: No Periwound Skin Moist: Yes Moist: Yes Moist: Yes Moisture: Maceration: No Dry/Scaly: No Periwound Skin Color: Atrophie Blanche: No No Abnormalities Noted No Abnormalities Noted Cyanosis: No Ecchymosis: No Erythema: No Hemosiderin Staining: No Mottled: No Pallor: No Rubor: No Temperature: No Abnormality No Abnormality No Abnormality Tenderness on Yes Yes Yes Palpation: Wound Preparation: Ulcer Cleansing: Other: Ulcer Cleansing: Other: Ulcer Cleansing: Other: soap and water soap and water soap and water Topical Anesthetic Topical Anesthetic Topical Anesthetic Applied: None Applied: Other: lidocaine Applied: None 4% Treatment Notes Electronic Signature(s) Signed: 03/06/2016 5:27:01 PM By: [name] Entered By: [name] on 03/06/2016 14:37:13 Kadrmas, [name] ([id number]) -------------------------------------------------------------------------------- Multi-Disciplinary Care Plan Details Patient Name: [name] Date of Service: 03/06/2016 2:15 PM Medical Record Patient Account Number: [id number] Number: Treating RN: [name] September 26, 1934 (80 y.o. Other Clinician: Date of Birth/Sex: Female) Treating [name], [name] Primary Care Physician/Extender: Claudette Laws Physician: Referring Physician: [name] in Treatment: 15 Active Inactive Orientation to the Wound Care  Program Nursing Diagnoses: Knowledge deficit related to the wound healing center program Goals: Patient/caregiver will verbalize understanding of the [facility] Date Initiated: 11/22/2015 Goal Status: Active Interventions: Provide education on orientation to the wound center Notes: Venous Leg Ulcer Nursing Diagnoses: Knowledge deficit related to disease process and management Potential for venous Insuffiency (use before diagnosis confirmed) Goals: Patient will maintain optimal edema control Date Initiated:  11/22/2015 Goal Status: Active Patient/caregiver will verbalize understanding of disease process and disease management Date Initiated: 11/22/2015 Goal Status: Active Verify adequate tissue perfusion prior to therapeutic compression application Date Initiated: 11/22/2015 Goal Status: Active Interventions: Assess peripheral edema status every visit. RENESHIA, ZUCCARO ([id number]) Compression as ordered Provide education on venous insufficiency Treatment Activities: Non-invasive vascular studies : 11/22/2015 Therapeutic compression applied : 11/22/2015 Notes: Wound/Skin Impairment Nursing Diagnoses: Impaired tissue integrity Knowledge deficit related to ulceration/compromised skin integrity Goals: Patient/caregiver will verbalize understanding of skin care regimen Date Initiated: 11/22/2015 Goal Status: Active Ulcer/skin breakdown will have a volume reduction of 30% by week 4 Date Initiated: 11/22/2015 Goal Status: Active Ulcer/skin breakdown will have a volume reduction of 50% by week 8 Date Initiated: 11/22/2015 Goal Status: Active Ulcer/skin breakdown will have a volume reduction of 80% by week 12 Date Initiated: 11/22/2015 Goal Status: Active Ulcer/skin breakdown will heal within 14 weeks Date Initiated: 11/22/2015 Goal Status: Active Interventions: Assess patient/caregiver ability to perform ulcer/skin care regimen upon admission and as needed Assess ulceration(s) every visit Provide education on ulcer and skin care Treatment Activities: Skin care regimen initiated : 11/22/2015 Topical wound management initiated : 11/22/2015 Notes: Electronic Signature(s) Signed: 03/06/2016 5:27:01 PM By: De Burrs, [name] ([id number]) Entered By: [name] on 03/06/2016 14:37:01 Deatley, [name] ([id number]) -------------------------------------------------------------------------------- Pain Assessment Details Patient Name: [name] Date of  Service: 03/06/2016 2:15 PM Medical Record Patient Account Number: [id number] Number: Treating RN: [name] 1934-08-29 (80 y.o. Other Clinician: Date of Birth/Sex: Female) Treating [name], [name] Primary Care Physician/Extender: Claudette Laws Physician: Referring Physician: [name] in Treatment: 15 Active Problems Location of Pain Severity and Description of Pain Patient Has Paino No Site Locations With Dressing Change: No Pain Management and Medication Current Pain Management: Electronic Signature(s) Signed: 03/06/2016 5:27:01 PM By: [name] Entered By: [name] on 03/06/2016 14:17:35 Signer, [name] ([id number]) -------------------------------------------------------------------------------- Patient/Caregiver Education Details Patient Name: [name] Date of Service: 03/06/2016 2:15 PM Medical Record Patient Account Number: [id number] Number: Treating RN: [name] 07/06/1934 (80 y.o. Other Clinician: Date of Birth/Gender: Female) Treating [name], [name] Primary Care Physician/Extender: Claudette Laws Physician: Suella [place] in Treatment: 15 Referring Physician: [name] Education Assessment Education Provided To: Patient Education Topics Provided Wound/Skin Impairment: Handouts: Other: do not get wrap wet Methods: Demonstration, Explain/Verbal Responses: State content correctly Electronic Signature(s) Signed: 03/06/2016 5:27:01 PM By: [name] Entered By: [name] on 03/06/2016 14:48:20 Loveless, Juni Loletha Grayer ([id number]) -------------------------------------------------------------------------------- Wound Assessment Details Patient Name: [name] Date of Service: 03/06/2016 2:15 PM Medical Record Patient Account Number: [id number] Number: Treating RN: [name] [birth year] (80 y.o. Other Clinician: Date of Birth/Sex: Female) Treating [name],  [name] Primary Care Physician/Extender: Claudette Laws Physician: Referring Physician: [name] in Treatment: 15 Wound Status Wound Number: 2 Primary Lymphedema Etiology: Wound Location: Right Lower Leg - Lateral Wound Status: Open Wounding Event: Gradually Appeared Comorbid Cataracts, Arrhythmia, Hypotension, Date Acquired: 07/04/2015 History: Osteoarthritis Weeks Of Treatment: 15 Clustered Wound: No Photos Photo Uploaded By: [name] on 03/06/2016 17:23:11  Wound Measurements Length: (cm) 0.1 % Reduction i Width: (cm) 0.1 % Reduction i Depth: (cm) 0.1 Epithelializa Area: (cm) 0.008 Tunneling: Volume: (cm) 0.001 Undermining: n Area: 99.4% n Volume: 99.3% tion: None No No Wound Description Classification: Partial Thickness Wound Margin: Distinct, outline attached Exudate Amount: Small Exudate Type: Serous Exudate Color: amber Foul Odor After Cleansing: No Wound Bed Granulation Amount: Large (67-100%) Exposed Structure Granulation Quality: Pink Fascia Exposed: No Hainer, Grey C. ([id number]) Necrotic Amount: None Present (0%) Fat Layer Exposed: No Tendon Exposed: No Muscle Exposed: No Joint Exposed: No Bone Exposed: No Limited to Skin Breakdown Periwound Skin Texture Texture Color No Abnormalities Noted: No No Abnormalities Noted: No Callus: No Atrophie Blanche: No Crepitus: No Cyanosis: No Excoriation: No Ecchymosis: No Fluctuance: No Erythema: No Friable: No Hemosiderin Staining: No Induration: No Mottled: No Localized Edema: Yes Pallor: No Rash: No Rubor: No Scarring: No Temperature / Pain Moisture Temperature: No Abnormality No Abnormalities Noted: No Tenderness on Palpation: Yes Dry / Scaly: No Maceration: No Moist: Yes Wound Preparation Ulcer Cleansing: Other: soap and water, Topical Anesthetic Applied: None Treatment Notes Wound #2 (Right, Lateral Lower Leg) 1. Cleansed with: Cleanse wound with  antibacterial soap and water 2. Anesthetic Topical Lidocaine 4% cream to wound bed prior to debridement 4. Dressing Applied: Promogran 5. Secondary Dressing Applied Dry Gauze 7. Secured with Tape 4-Layer Compression System - Right Lower Extremity Electronic Signature(s) Signed: 03/06/2016 5:27:01 PM By: [name] Entered By: [name] on 03/06/2016 14:33:58 Gosch, ANJANA CHEEK ([id number]) [place], [place]. ([id number]) -------------------------------------------------------------------------------- Wound Assessment Details Patient Name: [name], Yamaira C. Date of Service: 03/06/2016 2:15 PM Medical Record Patient Account Number: [id number] Number: Treating RN: [name] 06/06/1935 (80 y.o. Other Clinician: Date of Birth/Sex: Female) Treating [name], [name] Primary Care Physician/Extender: Claudette Laws Physician: Referring Physician: [name] in Treatment: 15 Wound Status Wound Number: 4 Primary Skin Tear Etiology: Wound Location: Right Lower Leg - Proximal Wound Status: Open Wounding Event: Shear/Friction Comorbid Cataracts, Arrhythmia, Hypotension, Date Acquired: 02/28/2016 History: Osteoarthritis Weeks Of Treatment: 1 Clustered Wound: No Photos Photo Uploaded By: [name] on 03/06/2016 17:23:37 Wound Measurements Length: (cm) 0.2 % Reduction i Width: (cm) 0.3 % Reduction i Depth: (cm) 0.1 Epithelializa Area: (cm) 0.047 Tunneling: Volume: (cm) 0.005 Undermining: n Area: 33.8% n Volume: 28.6% tion: None No No Wound Description Classification: Partial Thickness Wound Margin: Distinct, outline attached Exudate Amount: Large Exudate Type: Serosanguineous Exudate Color: red, brown Foul Odor After Cleansing: No Wound Bed Granulation Amount: Large (67-100%) Exposed Structure Granulation Quality: Red Fascia Exposed: No Broberg, Nzinga C. ([id number]) Necrotic Amount: None Present (0%) Fat Layer  Exposed: No Tendon Exposed: No Muscle Exposed: No Joint Exposed: No Bone Exposed: No Limited to Skin Breakdown Periwound Skin Texture Texture Color No Abnormalities Noted: No No Abnormalities Noted: No Moisture Temperature / Pain No Abnormalities Noted: No Temperature: No Abnormality Moist: Yes Tenderness on Palpation: Yes Wound Preparation Ulcer Cleansing: Other: soap and water, Topical Anesthetic Applied: Other: lidocaine 4%, Treatment Notes Wound #4 (Right, Proximal Lower Leg) 1. Cleansed with: Cleanse wound with antibacterial soap and water 2. Anesthetic Topical Lidocaine 4% cream to wound bed prior to debridement 4. Dressing Applied: Calcium Alginate 5. Secondary Dressing Applied Dry Gauze 7. Secured with Tape 4-Layer Compression System - Right Lower Extremity Electronic Signature(s) Signed: 03/06/2016 5:27:01 PM By: [name] Entered By: [name] on 03/06/2016 14:34:26 Yawn, Aquanetta C. ([id number]) -------------------------------------------------------------------------------- Wound Assessment Details Patient Name: [name], Rikia C. Date of Service: 03/06/2016 2:15  PM Medical Record Patient Account Number: [id number] Number: Treating RN: [name] 17-Jul-1934 (80 y.o. Other Clinician: Date of Birth/Sex: Female) Treating [name], [name] Primary Care Physician/Extender: Claudette Laws Physician: Referring Physician: [name] in Treatment: 15 Wound Status Wound Number: 5 Primary Skin Tear Etiology: Wound Location: Right Lower Leg - Distal Wound Status: Open Wounding Event: Shear/Friction Comorbid Cataracts, Arrhythmia, Hypotension, Date Acquired: 02/28/2016 History: Osteoarthritis Weeks Of Treatment: 1 Clustered Wound: No Photos Photo Uploaded By: [name] on 03/06/2016 17:24:07 Wound Measurements Length: (cm) 0.5 % Reduction i Width: (cm) 1.3 % Reduction i Depth: (cm) 0.1 Epithelializa Area:  (cm) 0.511 Tunneling: Volume: (cm) 0.051 Undermining: n Area: 45.8% n Volume: 45.7% tion: None No No Wound Description Classification: Partial Thickness Wound Margin: Distinct, outline attached Exudate Amount: Large Exudate Type: Serosanguineous Exudate Color: red, brown Foul Odor After Cleansing: No Wound Bed Granulation Amount: Large (67-100%) Exposed Structure Granulation Quality: Red Fascia Exposed: No Tecson, Cynithia C. ([id number]) Necrotic Amount: None Present (0%) Fat Layer Exposed: No Tendon Exposed: No Muscle Exposed: No Joint Exposed: No Bone Exposed: No Limited to Skin Breakdown Periwound Skin Texture Texture Color No Abnormalities Noted: No No Abnormalities Noted: No Moisture Temperature / Pain No Abnormalities Noted: No Temperature: No Abnormality Moist: Yes Tenderness on Palpation: Yes Wound Preparation Ulcer Cleansing: Other: soap and water, Topical Anesthetic Applied: None Treatment Notes Wound #5 (Right, Distal Lower Leg) 1. Cleansed with: Cleanse wound with antibacterial soap and water 2. Anesthetic Topical Lidocaine 4% cream to wound bed prior to debridement 4. Dressing Applied: Calcium Alginate 5. Secondary Dressing Applied Dry Gauze 7. Secured with Tape 4-Layer Compression System - Right Lower Extremity Electronic Signature(s) Signed: 03/06/2016 5:27:01 PM By: [name] Entered By: [name] on 03/06/2016 14:35:04 Deetz, Alexandrina Loletha Grayer ([id number]) -------------------------------------------------------------------------------- Vitals Details Patient Name: [name] Date of Service: 03/06/2016 2:15 PM Medical Record Patient Account Number: [id number] Number: Treating RN: [name] 12-26-1934 (80 y.o. Other Clinician: Date of Birth/Sex: Female) Treating [name], [name] Primary Care Physician/Extender: Claudette Laws Physician: Referring Physician: [name] in Treatment:  15 Vital Signs Time Taken: 14:18 Pulse (bpm): 56 Height (in): 68 Respiratory Rate (breaths/min): 22 Weight (lbs): 178 Blood Pressure (mmHg): 165/62 Body Mass Index (BMI): 27.1 Reference Range: 80 - 120 mg / dl Pulse Oximetry (%): 90 Notes sats 90 made Dr. Dellia Nims aware Electronic Signature(s) Signed: 03/06/2016 5:27:01 PM By: [name] Entered By: [name] on 03/06/2016 14:21:25

## 2016-03-13 ENCOUNTER — Telehealth: Payer: Self-pay

## 2016-03-13 ENCOUNTER — Telehealth: Payer: Self-pay | Admitting: *Deleted

## 2016-03-13 ENCOUNTER — Encounter: Payer: Self-pay | Admitting: *Deleted

## 2016-03-13 ENCOUNTER — Encounter: Payer: Medicare Other | Admitting: Internal Medicine

## 2016-03-13 DIAGNOSIS — Z952 Presence of prosthetic heart valve: Secondary | ICD-10-CM

## 2016-03-13 DIAGNOSIS — Z9889 Other specified postprocedural states: Secondary | ICD-10-CM

## 2016-03-13 DIAGNOSIS — I5042 Chronic combined systolic (congestive) and diastolic (congestive) heart failure: Secondary | ICD-10-CM | POA: Diagnosis not present

## 2016-03-13 NOTE — Progress Notes (Signed)
Discharge Summary  Patient Details  Name: [name] MRN: [id number] Date of Birth: 11/16/1934 Referring Provider:     Number of Visits: 18 (last visit 01/17/16  Reason for Discharge:  Early Exit:  Personal - recent medical issues  Smoking History:  History  Smoking Status  . Former Smoker  Smokeless Tobacco  . Never Used    Diagnosis:  S/P MVR (mitral valve replacement)  Chronic combined systolic and diastolic congestive heart failure (HCC)  S/P MVR (mitral valve repair)  ADL [facility]:   Initial Exercise Prescription:     Initial Exercise Prescription - 09/25/15 1500      Date of Initial Exercise RX and Referring Provider   Date 09/25/15     Treadmill   MPH 1  Use caution with treadmill and small time intervals   Grade 0   Minutes 3     Recumbant Bike   Level 2   RPM 40   Watts 15   Minutes 10     NuStep   Level 2   Watts 20   Minutes 15     Arm Ergometer   Level 1   Watts 10   Minutes 15     Arm/Foot Ergometer   Level 4   Watts 12   Minutes 15     Recumbant Elliptical   Level 1   RPM 35   Watts 10   Minutes 10     REL-XR   Level 2   Watts 30   Minutes 15     T5 Nustep   Level 2   Watts 15   Minutes 15     Biostep-RELP   Level 2   Watts 15   Minutes 15     Prescription Details   Frequency (times per week) 3   Duration Progress to 30 minutes of continuous aerobic without signs/symptoms of physical distress     Intensity   THRR REST +  30   Ratings of Perceived Exertion 11-15     Progression   Progression Continue progressive overload as per policy without signs/symptoms or physical distress.     Resistance Training   Training Prescription Yes   Weight 2   Reps 10-15      Discharge Exercise Prescription (Final Exercise Prescription Changes):     Exercise Prescription Changes - 02/23/16 0900      Exercise Review   Progression Yes     Response to Exercise   Blood Pressure (Admit) 132/70   Blood Pressure  (Exercise) 138/60   Blood Pressure (Exit) 124/62   Heart Rate (Admit) 69 bpm   Heart Rate (Exercise) 81 bpm   Heart Rate (Exit) 59 bpm   Rating of Perceived Exertion (Exercise) 15   Duration Progress to 45 minutes of aerobic exercise without signs/symptoms of physical distress   Intensity THRR New  70-100     Progression   Progression Continue to progress workloads to maintain intensity without signs/symptoms of physical distress.     Resistance Training   Training Prescription Yes   Weight 1   Reps 10-15     Interval Training   Interval Training No     NuStep   Level 7   Minutes 40      Functional Capacity:     6 Minute Walk    Row Name 09/25/15 1509         6 Minute Walk   Phase Initial  NuStep Test      Distance  413 feet  Nu Step Test Level 1 with arms     Walk Time 6 minutes     # of Rest Breaks 0     RPE 13     Symptoms No     Resting HR 111 bpm     Resting BP 132/60     Max Ex. HR 116 bpm     Max Ex. BP 138/54        Psychological, QOL, Others - Outcomes: PHQ 2/9: Depression screen [facility] 2/9 09/25/2015 01/05/2015  Decreased Interest 0 0  Down, Depressed, Hopeless 0 0  PHQ - 2 Score 0 0  Altered sleeping 3 1  Tired, decreased energy 3 1  Change in appetite 0 0  Feeling bad or failure about yourself  0 0  Trouble concentrating 0 0  Moving slowly or fidgety/restless 0 0  Suicidal thoughts 0 0  PHQ-9 Score 6 2  Difficult doing work/chores Somewhat difficult -    Quality of Life:     Quality of Life - 09/25/15 1541      Quality of Life Scores   Health/Function Pre 19.5 %   Socioeconomic Pre 26.75 %   Psych/Spiritual Pre 20.64 %   Family Pre 24.88 %   GLOBAL Pre 21.59 %      Personal Goals: Goals established at orientation with interventions provided to work toward goal.     Personal Goals and Risk Factors at Admission - 09/25/15 1536      Core Components/Risk Factors/Patient Goals on Admission   Increase Strength and Stamina Yes    Intervention Provide advice, education, support and counseling about physical activity/exercise needs.;Develop an individualized exercise prescription for aerobic and resistive training based on initial evaluation findings, risk stratification, comorbidities and participant's personal goals.   Expected Outcomes Achievement of increased cardiorespiratory fitness and enhanced flexibility, muscular endurance and strength shown through measurements of functional capacity and personal statement of participant.   Heart Failure Yes   Intervention Provide a combined exercise and nutrition program that is supplemented with education, support and counseling about heart failure. Directed toward relieving symptoms such as shortness of breath, decreased exercise tolerance, and extremity edema.   Expected Outcomes Improve functional capacity of life;Short term: Attendance in program 2-3 days a week with increased exercise capacity. Reported lower sodium intake. Reported increased fruit and vegetable intake. Reports medication compliance.;Short term: Daily weights obtained and reported for increase. Utilizing diuretic protocols set by physician.;Long term: Adoption of self-care skills and reduction of barriers for early signs and symptoms recognition and intervention leading to self-care maintenance.       Personal Goals Discharge:     Goals and Risk Factor Review    Row Name 10/18/15 1900 12/11/15 1322 01/15/16 1758 02/08/16 1230 03/13/16 0845     Core Components/Risk Factors/Patient Goals Review   Personal Goals Review  - Sedentary Heart Failure Heart Failure  -   Review Was in  hospital [name]'s son called to say [name] fell and broker her tail bone and is very sore. She said she is sorry she can't attend Cardiac Rehab. but she is sore and on pain medicine. I tried to call her son back at [phone] but his vm was full.  [name] sees [name] every 1-2 weeks to monitor CHF. [name] checks her weight  daily and takes Lasix per her Drs. instructions.   I called [name] to check on her. [name] reported that she almost called the ambulance last night since she has  been up all night not feeling well. "I think my kidneys are shutting down. I have an appt with Carrie. [name] tomorrow. I am trying to stop taking the tramadol since I think I am getting addicted to it.". Lanier Felty said she really wants to come back to Cardiac Rehab but she can't right now. "   Expected Outcomes  - Able to return to Cardiac Rehab eventually.  Leonda will comtinue to control CHF by following Carrie.s advice and using meds as directed. Avery will keep her HF under control. Erik has a follow up appt with Carrie. Sabra [name] and hopes to control her heart failure better.       Nutrition & Weight - Outcomes:     Pre Biometrics - 09/25/15 1508      Pre Biometrics   Height 5' 5.5" (1.664 m)   Weight 186 lb 1.6 oz (84.4 kg)   Waist Circumference 34 inches   Hip Circumference 42 inches   Waist to Hip Ratio 0.81 %   BMI (Calculated) 30.6       Nutrition:     Nutrition Therapy & Goals - 10/04/15 1707      Personal Nutrition Goals   Comments Ms. Juhnke prefers not to meet with RD       Nutrition Discharge:     Nutrition Assessments - 09/28/15 1421      Rate Your Plate Scores   Pre Score 65   Pre Score % 72 %      Education Questionnaire Score:     Knowledge Questionnaire Score - 09/25/15 1532      Knowledge Questionnaire Score   Pre Score 18      Goals reviewed with patient; copy given to patient.

## 2016-03-13 NOTE — Telephone Encounter (Signed)
[name] states she is still having trouble with her back.  Nothing has been diagnosed at this point, but she feels like it has something to do with her kidneys.  She sis try a massage and that helped some.  I spoke with her about discharging from [facility] for now and she can start again when shes able.

## 2016-03-13 NOTE — Progress Notes (Signed)
Cardiac Individual Treatment Plan  Patient Details  Name: [name] MRN: [id number] Date of Birth: 12/10/1934 Referring Provider:    Initial Encounter Date:  Flowsheet Row Cardiac Rehab from 09/25/2015 in [facility] Cardiac and Pulmonary Rehab  Date  09/25/15      Visit Diagnosis: S/P MVR (mitral valve replacement)  Chronic combined systolic and diastolic congestive heart failure ([place])  Patient's Home Medications on Admission:  Current Outpatient Prescriptions:  .  acetaminophen (TYLENOL) 500 MG tablet, Take 500 mg by mouth every 6 (six) hours as needed for mild pain, moderate pain, fever or headache., Disp: , Rfl:  .  amiodarone (PACERONE) 200 MG tablet, Take 200 mg by mouth daily., Disp: , Rfl:  .  aspirin EC 81 MG tablet, Take 81 mg by mouth daily. , Disp: , Rfl:  .  furosemide (LASIX) 40 MG tablet, Take 60 mg by mouth daily., Disp: , Rfl:  .  gabapentin (NEURONTIN) 100 MG capsule, Take 1 capsule (100 mg total) by mouth at bedtime., Disp: 90 capsule, Rfl: 0 .  metoprolol succinate (TOPROL-XL) 25 MG 24 hr tablet, Take 1 tablet (25 mg total) by mouth daily., Disp: 30 tablet, Rfl: 2 .  Multiple Vitamin (MULTI-VITAMINS) TABS, Take 1 tablet by mouth daily. , Disp: , Rfl:  .  pantoprazole (PROTONIX) 40 MG tablet, Take 40 mg by mouth daily. , Disp: , Rfl:  .  potassium chloride (K-DUR) 10 MEQ tablet, Take by mouth., Disp: , Rfl:  .  spironolactone (ALDACTONE) 25 MG tablet, Take 25 mg by mouth daily., Disp: , Rfl:  .  traMADol (ULTRAM) 50 MG tablet, TAKE 1/2 TO 1 TABLET BY MOUTH TWICE A DAY AS NEEDED FOR PAIN, Disp: , Rfl: 5 .  vitamin C (ASCORBIC ACID) 500 MG tablet, Take 500 mg by mouth daily as needed (for cold symptoms). , Disp: , Rfl:  .  warfarin (COUMADIN) 5 MG tablet, Take 1 tablet (5 mg total) by mouth daily., Disp: 30 tablet, Rfl: 2  Past Medical History: Past Medical History:  Diagnosis Date  . Arthritis   . Chronic atrial fibrillation ([place])    a. On Coumadin  .  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 EF 25% by echo in 08/2015 b. RHC in 08/2015 showed normal filling pressures  . GERD (gastroesophageal reflux disease)   . Hypertension   . S/P MVR (mitral valve replacement)    a. MVR 1994 b. redo MVR in 08/2014 - on Coumadin  . Shortness of breath dyspnea     Tobacco Use: History  Smoking Status  . Former Smoker  Smokeless Tobacco  . Never Used    Labs: Recent Review Flowsheet Data    There is no flowsheet data to display.       Exercise Target Goals:    Exercise Program Goal: Individual exercise prescription set with THRR, safety & activity barriers. Participant demonstrates ability to understand and report RPE using BORG scale, to self-measure pulse accurately, and to acknowledge the importance of the exercise prescription.  Exercise Prescription Goal: Starting with aerobic activity 30 plus minutes a day, 3 days per week for initial exercise prescription. Provide home exercise prescription and guidelines that participant acknowledges understanding prior to discharge.  Activity Barriers & Risk Stratification:     Activity Barriers & Cardiac Risk Stratification - 09/25/15 1531      Activity Barriers & Cardiac Risk Stratification   Activity Barriers Arthritis;Joint Problems   Cardiac Risk Stratification  High      6 Minute Walk:     6 Minute Walk    Row Name 09/25/15 1509         6 Minute Walk   Phase Initial  NuStep Test      Distance 413 feet  Nu Step Test Level 1 with arms     Walk Time 6 minutes     # of Rest Breaks 0     RPE 13     Symptoms No     Resting HR 111 bpm     Resting BP 132/60     Max Ex. HR 116 bpm     Max Ex. BP 138/54        Initial Exercise Prescription:     Initial Exercise Prescription - 09/25/15 1500      Date of Initial Exercise RX and Referring Provider   Date 09/25/15     Treadmill   MPH 1  Use caution with treadmill and  small time intervals   Grade 0   Minutes 3     Recumbant Bike   Level 2   RPM 40   Watts 15   Minutes 10     NuStep   Level 2   Watts 20   Minutes 15     Arm Ergometer   Level 1   Watts 10   Minutes 15     Arm/Foot Ergometer   Level 4   Watts 12   Minutes 15     Recumbant Elliptical   Level 1   RPM 35   Watts 10   Minutes 10     REL-XR   Level 2   Watts 30   Minutes 15     T5 Nustep   Level 2   Watts 15   Minutes 15     Biostep-RELP   Level 2   Watts 15   Minutes 15     Prescription Details   Frequency (times per week) 3   Duration Progress to 30 minutes of continuous aerobic without signs/symptoms of physical distress     Intensity   THRR REST +  30   Ratings of Perceived Exertion 11-15     Progression   Progression Continue progressive overload as per policy without signs/symptoms or physical distress.     Resistance Training   Training Prescription Yes   Weight 2   Reps 10-15      Perform Capillary Blood Glucose checks as needed.  Exercise Prescription Changes:     Exercise Prescription Changes    Row Name 12/29/15 1000 01/11/16 1200 01/25/16 1400 02/09/16 1000 02/23/16 0900     Exercise Review   Progression  - Yes Yes Yes Yes     Response to Exercise   Blood Pressure (Admit) 114/56 142/80 122/60 130/70 132/70   Blood Pressure (Exercise) 102/50 112/52 128/70 154/70 138/60   Blood Pressure (Exit) 122/64 116/60 132/80 134/70 124/62   Heart Rate (Admit) 58 bpm 58 bpm 59 bpm 64 bpm 69 bpm   Heart Rate (Exercise) 60 bpm 78 bpm 58 bpm 80 bpm 81 bpm   Heart Rate (Exit) 66 bpm 54 bpm 44 bpm 62 bpm 59 bpm   Rating of Perceived Exertion (Exercise) 13 13 13 15 15    Duration Progress to 30 minutes of continuous aerobic without signs/symptoms of physical distress Progress to 45 minutes of aerobic exercise without signs/symptoms of physical distress Progress to 45 minutes of aerobic exercise without signs/symptoms of  physical distress Progress  to 45 minutes of aerobic exercise without signs/symptoms of physical distress Progress to 45 minutes of aerobic exercise without signs/symptoms of physical distress   Intensity THRR unchanged  - THRR unchanged THRR unchanged THRR New  70-100     Progression   Progression Continue to progress workloads to maintain intensity without signs/symptoms of physical distress. Continue to progress workloads to maintain intensity without signs/symptoms of physical distress.  - Continue to progress workloads to maintain intensity without signs/symptoms of physical distress. Continue to progress workloads to maintain intensity without signs/symptoms of physical distress.     Resistance Training   Training Prescription Yes Yes Yes Yes Yes   Weight 2 1 1 1 1    Reps 10-15 10-15 10-15 10-15 10-15     Interval Training   Interval Training  -  -  - No No     Treadmill   MPH 1  -  -  -  -   Grade 0  -  -  -  -   Minutes 5  -  -  -  -     NuStep   Level 4 4 4 5 7    Watts  - 24  -  -  -   Minutes 25 35  - 35 40      Exercise Comments:     Exercise Comments    Row Name 10/20/15 1158 11/16/15 1540 12/29/15 1020 01/11/16 1245 01/22/16 1743   Exercise Comments Avelina has not attended Heart Track since 09-27-15. Goal will be to resume program when able. Bernadine has not attended since 3-29. Ms Gilbertson has returned after a 3 month absence.  Her exercise prescription was reviewed as well as equipment safety. Margaet is progresing well with exercise duration and has added TM for short periods. Margret has had hip pain while standing. Avoided the TM today due to this pain but tolerated the NS machine well with no pain during exercise. She is working with her doctor reguarding her hip pain.    [place] Name 01/25/16 1444 01/25/16 1643 01/25/16 1720 02/23/16 1001     Exercise Comments Jaylina continues to do well with exercise. Cataleia said she wished they didn't rewrap her leg yesterday since it was uncomfortable  when they rewrapped it. Harika is doing ok on the Nustep with her legs today. I will fax heart rhythm strips to Dr. [name] after this class. Heart rate 44-58 at times even exercising. No c/o except today a little tired since she was awake in the middle of the night.  Aradhana has done well adding resistance to her exercise.  She has some back pain that interferes with regular attendance.       Discharge Exercise Prescription (Final Exercise Prescription Changes):     Exercise Prescription Changes - 02/23/16 0900      Exercise Review   Progression Yes     Response to Exercise   Blood Pressure (Admit) 132/70   Blood Pressure (Exercise) 138/60   Blood Pressure (Exit) 124/62   Heart Rate (Admit) 69 bpm   Heart Rate (Exercise) 81 bpm   Heart Rate (Exit) 59 bpm   Rating of Perceived Exertion (Exercise) 15   Duration Progress to 45 minutes of aerobic exercise without signs/symptoms of physical distress   Intensity THRR New  70-100     Progression   Progression Continue to progress workloads to maintain intensity without signs/symptoms of physical distress.     Resistance Training  Training Prescription Yes   Weight 1   Reps 10-15     Interval Training   Interval Training No     NuStep   Level 7   Minutes 40      Nutrition:  Target Goals: Understanding of nutrition guidelines, daily intake of sodium <1585m, cholesterol <2071m calories 30% from fat and 7% or less from saturated fats, daily to have 5 or more servings of fruits and vegetables.  Biometrics:     Pre Biometrics - 09/25/15 1508      Pre Biometrics   Height 5' 5.5" (1.664 m)   Weight 186 lb 1.6 oz (84.4 kg)   Waist Circumference 34 inches   Hip Circumference 42 inches   Waist to Hip Ratio 0.81 %   BMI (Calculated) 30.6       Nutrition Therapy Plan and Nutrition Goals:     Nutrition Therapy & Goals - 10/04/15 1707      Personal Nutrition Goals   Comments Ms. Salehi prefers not to meet with  RD       Nutrition Discharge: Rate Your Plate Scores:     Nutrition Assessments - 09/28/15 1421      Rate Your Plate Scores   Pre Score 65   Pre Score % 72 %      Nutrition Goals Re-Evaluation:   Psychosocial: Target Goals: Acknowledge presence or absence of depression, maximize coping skills, provide positive support system. Participant is able to verbalize types and ability to use techniques and skills needed for reducing stress and depression.  Initial Review & Psychosocial Screening:     Initial Psych Review & Screening - 09/25/15 15WalnutYes      Quality of Life Scores:     Quality of Life - 09/25/15 1541      Quality of Life Scores   Health/Function Pre 19.5 %   Socioeconomic Pre 26.75 %   Psych/Spiritual Pre 20.64 %   Family Pre 24.88 %   GLOBAL Pre 21.59 %      PHQ-9: Recent Review Flowsheet Data    [facility] 09/25/2015 01/05/2015   Decreased Interest 0 0   Down, Depressed, Hopeless 0 0   PHQ - 2 Score 0 0   Altered sleeping 3 1   Tired, decreased energy 3 1   Change in appetite 0 0   Feeling bad or failure about yourself  0 0   Trouble concentrating 0 0   Moving slowly or fidgety/restless 0 0   Suicidal thoughts 0 0   PHQ-9 Score 6 2   Difficult doing work/chores Somewhat difficult -      Psychosocial Evaluation and Intervention:     Psychosocial Evaluation - 12/27/15 1714      Psychosocial Evaluation & Interventions   Comments Counselor met with Ms. SkKallenor a new psychosocial evaluation.  Ms S was in this same program a little over a year ago and is now back due to some recent health issues.  She continues to have a strong support system with a daughter living in the same home and a son that is close by and very involved.  Ms. SkCockeeports that she has had to contend a great deal with swelling in her body and was in the hospital for this in March.  She has subsequently had  surgery on her leg to remove a growth there and is going to the wound center  often for treatment of that.  Ms. Chauncey Cruel reports she is sleeping better lately and has a good appetite.  She denies a history of depression or anxiety, but admits she does have some stress in her life with her adult children arguing quite often over her care.  Her goals for this program are to get the swelling down so the wounds will heal better.  Counselor will follow with Ms. S as needed.        Psychosocial Re-Evaluation:     Psychosocial Re-Evaluation    Row Name 09/28/15 1422 12/11/15 1323 01/11/16 1227 02/07/16 1719 02/19/16 1855     Psychosocial Re-Evaluation   Comments When I checked EPIC to chart Rate your plate, I noticed Letricia Krinsky is in the [place] [facility] today.  Drake's son called to say Shanon fell and broker her tail bone and is very sore. I called and spoke to Mrs. Friesz and  she said she is sorry she can't attend Cardiac Rehab. but she is sore and on pain medicine. I tried to call her son back at [phone] but his vm was full.  Maergaret Luckadoo left a vm that she is sorry that she has not been able to attend Cardiac Rehab due to several infections.  Counselor follow up with Ms. Mcgraw today reporting she is feeling better overall and her swelling has reduced some since coming to this class.  She stated ongoing treatment at the wound center has helped and she is feeling stronger while working out in this program.  Ms. Chauncey Cruel reports the stress is less with her adult children arguing over her care now; or she has just learned to "stop worrying about it!"  counselor commended Ms. Earll for her hard work and commitment to consistent exercise.   Cresencia called and said she is sorry but she can't attend Cardiac Rehab today since she has back problems.    Continued Psychosocial Services Needed Yes  -  -  -  -      Vocational Rehabilitation: Provide vocational rehab assistance to qualifying  candidates.   Vocational Rehab Evaluation & Intervention:     Vocational Rehab - 09/25/15 1532      Initial Vocational Rehab Evaluation & Intervention   Assessment shows need for Vocational Rehabilitation No      Education: Education Goals: Education classes will be provided on a weekly basis, covering required topics. Participant will state understanding/return demonstration of topics presented.  Learning Barriers/Preferences:   Education Topics: General Nutrition Guidelines/Fats and Fiber: -Group instruction provided by verbal, written material, models and posters to present the general guidelines for heart healthy nutrition. Gives an explanation and review of dietary fats and fiber. Flowsheet Row Cardiac Rehab from 02/14/2016 in [facility] Cardiac [facility] Pul[facility] Rehab  Date  01/22/16  Educator  PI  Instruction Review Code  2- meets goals/outcomes      Controlling Sodium/Reading Food Labels: -Group verbal and written material supporting the discussion of sodium use in heart healthy nutrition. Review and explanation with models, verbal and written materials for utilization of the food label. Flowsheet Row Cardiac Rehab from 02/14/2016 in [facility] Cardiac and Pulmonary Rehab  Date  01/29/16  Educator  PI  Instruction Review Code  2- meets goals/outcomes      Exercise Physiology & Risk Factors: - Group verbal and written instruction with models to review the exercise physiology of the cardiovascular system and associated critical values. Details cardiovascular disease risk factors and the goals associated with each risk factor.  Flowsheet Row Cardiac Rehab from 02/14/2016 in [facility] Cardiac and Pulmonary Rehab  Date  02/05/16  Educator  [facility]  Instruction Review Code  2- meets goals/outcomes      Aerobic Exercise & Resistance Training: - Gives group verbal and written discussion on the health impact of inactivity. On the components of aerobic and resistive training programs and the benefits  of this training and how to safely progress through these programs. Flowsheet Row Cardiac Rehab from 02/14/2016 in [facility] Cardiac and Pulmonary Rehab  Date  02/07/16  Educator  Nada Maclachlan  Instruction Review Code  2- meets goals/outcomes      Flexibility, Balance, General Exercise Guidelines: - Provides group verbal and written instruction on the benefits of flexibility and balance training programs. Provides general exercise guidelines with specific guidelines to those with heart or lung disease. Demonstration and skill practice provided. Flowsheet Row Cardiac Rehab from 02/14/2016 in [facility] Cardiac and Pulmonary Rehab  Date  02/12/16  Educator  AS  Instruction Review Code  2- meets goals/outcomes      Stress Management: - Provides group verbal and written instruction about the health risks of elevated stress, cause of high stress, and healthy ways to reduce stress. Flowsheet Row Cardiac Rehab from 02/14/2016 in [facility] Cardiac and Pulmonary Rehab  Date  02/14/16  Educator  [facility]  Instruction Review Code  2- meets goals/outcomes      Depression: - Provides group verbal and written instruction on the correlation between heart/lung disease and depressed mood, treatment options, and the stigmas associated with seeking treatment. Flowsheet Row Cardiac Rehab from 02/14/2016 in [facility] Cardiac and Pulmonary Rehab  Date  11/09/14  Educator  [facility]  Instruction Review Code  2- meets goals/outcomes      Anatomy & Physiology of the Heart: - Group verbal and written instruction and models provide basic cardiac anatomy and physiology, with the coronary electrical and arterial systems. Review of: AMI, Angina, Valve disease, Heart Failure, Cardiac Arrhythmia, Pacemakers, and the ICD. Flowsheet Row Cardiac Rehab from 02/14/2016 in [facility] Cardiac and Pulmonary Rehab  Date  12/25/15  Educator  DW  Instruction Review Code  2- meets goals/outcomes      Cardiac Procedures: - Group verbal and written instruction  and models to describe the testing methods done to diagnose heart disease. Reviews the outcomes of the test results. Describes the treatment choices: Medical Management, Angioplasty, or Coronary Bypass Surgery. Flowsheet Row Cardiac Rehab from 02/14/2016 in [facility] Cardiac and Pulmonary Rehab  Date  11/07/14  Educator  Foster Simpson RN BSN CCRP  Instruction Review Code  2- meets goals/outcomes      Cardiac Medications: - Group verbal and written instruction to review commonly prescribed medications for heart disease. Reviews the medication, class of the drug, and side effects. Includes the steps to properly store meds and maintain the prescription regimen. Flowsheet Row Cardiac Rehab from 02/14/2016 in [facility] Cardiac and Pulmonary Rehab  Date  01/15/16 Marisue Humble 2]  Educator  DW  Instruction Review Code  2- meets goals/outcomes      Go Sex-Intimacy & Heart Disease, Get SMART - Goal Setting: - Group verbal and written instruction through game format to discuss heart disease and the return to sexual intimacy. Provides group verbal and written material to discuss and apply goal setting through the application of the S.M.A.R.T. Method. Flowsheet Row Cardiac Rehab from 02/14/2016 in [facility] Cardiac and Pulmonary Rehab  Date  11/07/14  Educator  Foster Simpson RN BSN CCRP  Instruction Review Code  2- meets goals/outcomes      Other Matters of the Heart: - Provides group verbal, written materials and models to describe Heart Failure, Angina, Valve Disease, and Diabetes in the realm of heart disease. Includes description of the disease process and treatment options available to the cardiac patient. Flowsheet Row Cardiac Rehab from 02/14/2016 in [facility] Cardiac and Pulmonary Rehab  Date  12/14/14  Educator  CE  Instruction Review Code  2- meets goals/outcomes      Exercise & Equipment Safety: - Individual verbal instruction and demonstration of equipment use and safety with use of the equipment. Flowsheet Row Cardiac  Rehab from 02/14/2016 in [facility] Cardiac and Pulmonary Rehab  Date  09/25/15  Educator  C. EnterkinRN  Instruction Review Code  1- partially meets, needs review/practice      Infection Prevention: - Provides verbal and written material to individual with discussion of infection control including proper hand washing and proper equipment cleaning during exercise session. Flowsheet Row Cardiac Rehab from 02/14/2016 in [facility] Cardiac and Pulmonary Rehab  Date  09/25/15  Educator  C. EnterkinRN  Instruction Review Code  2- meets goals/outcomes      Falls Prevention: - Provides verbal and written material to individual with discussion of falls prevention and safety. Flowsheet Row Cardiac Rehab from 02/14/2016 in [facility] Cardiac and Pulmonary Rehab  Date  09/25/15  Educator  C. [place]  Instruction Review Code  1- partially meets, needs review/practice      Diabetes: - Individual verbal and written instruction to review signs/symptoms of diabetes, desired ranges of glucose level fasting, after meals and with exercise. Advice that pre and post exercise glucose checks will be done for 3 sessions at entry of program.    Knowledge Questionnaire Score:     Knowledge Questionnaire Score - 09/25/15 1532      Knowledge Questionnaire Score   Pre Score 18      Core Components/Risk Factors/Patient Goals at Admission:     Personal Goals and Risk Factors at Admission - 09/25/15 1536      Core Components/Risk Factors/Patient Goals on Admission   Increase Strength and Stamina Yes   Intervention Provide advice, education, support and counseling about physical activity/exercise needs.;Develop an individualized exercise prescription for aerobic and resistive training based on initial evaluation findings, risk stratification, comorbidities and participant's personal goals.   Expected Outcomes Achievement of increased cardiorespiratory fitness and enhanced flexibility, muscular endurance and strength  shown through measurements of functional capacity and personal statement of participant.   Heart Failure Yes   Intervention Provide a combined exercise and nutrition program that is supplemented with education, support and counseling about heart failure. Directed toward relieving symptoms such as shortness of breath, decreased exercise tolerance, and extremity edema.   Expected Outcomes Improve functional capacity of life;Short term: Attendance in program 2-3 days a week with increased exercise capacity. Reported lower sodium intake. Reported increased fruit and vegetable intake. Reports medication compliance.;Short term: Daily weights obtained and reported for increase. Utilizing diuretic protocols set by physician.;Long term: Adoption of self-care skills and reduction of barriers for early signs and symptoms recognition and intervention leading to self-care maintenance.      Core Components/Risk Factors/Patient Goals Review:      Goals and Risk Factor Review    Row Name 10/18/15 1900 12/11/15 1322 01/15/16 1758 02/08/16 1230       Core Components/Risk Factors/Patient Goals Review   Personal Goals Review  - Sedentary Heart Failure Heart Failure    Review Was in  hospital Maitland's son called to say Liz fell and broker her tail bone and is very sore. She said she is sorry she can't attend Cardiac Rehab. but she is sore and on pain medicine. I tried to call her son back at [phone] but his vm was full.  Latitia sees Dr Sabra Heck every 1-2 weeks to monitor CHF. Kaliann checks her weight daily and takes Lasix per her Drs. instructions.      Expected Outcomes  - Able to return to Cardiac Rehab eventually.  Lorell will comtinue to control CHF by following Dr.s advice and using meds as directed. Rheana will keep her HF under control.       Core Components/Risk Factors/Patient Goals at Discharge (Final Review):      Goals and Risk Factor Review - 02/08/16 1230      Core  Components/Risk Factors/Patient Goals Review   Personal Goals Review Heart Failure   Review Dicy checks her weight daily and takes Lasix per her Drs. instructions.     Expected Outcomes Machele will keep her HF under control.      ITP Comments:     ITP Comments    Row Name 09/26/15 0825 09/28/15 1422 10/22/15 1012 11/19/15 1112 12/11/15 1318   ITP Comments 30 day review   Continue with ITP    New start to program has attended medical review orientation. When I checked EPIC to chart Rate your plate, I noticed Reika Callanan is in the [facility] Emer[facility] Dept. 30 Day review. Continue with ITP  Last visit March 29,2017 30 day review. Continue with ITP. Remains out after hospital admit for HF.  Waiting for MD clearance. Christana's son called to say Maripaz fell and broker her tail bone and is very sore. She said she is sorry she can't attend Cardiac Rehab. but she is sore and on pain medicine. I tried to call her son back at [phone] but his vm was full.    [place] Name 12/14/15 1438 12/20/15 0908 01/11/16 1226 01/17/16 0820 01/18/16 1348   ITP Comments patient has not returned to cardiac rehab - last session 09/27/15 30 day review.  Continue with ITP   Remains out, expected to return since MD has released her for exercise Maergaret Vossler left a vm that she is sorry that she has not been able to attend Cardiac Rehab due to several infections.  30 day review. Continue with ITP. Has returned to program 30 day review. Continue with ITP. Has returned to program   [place] Name 01/25/16 1640 02/01/16 1640 02/08/16 1805 02/14/16 0843 02/19/16 1855   ITP Comments Stepheni said she is a little tired today since she got up in the middle of the night to add something to a bean dishe she was making overnight for a friend of mine. Melaya said she wished they didn't rewrap her leg yesterday since it was uncomfortable when they rewrapped it. Asuzena is doing ok on the Nustep with her legs today. Jumanah stated she felt  discomfort between shoulder blades last night and went to her physician today.  EKG performed with no signs of concern.  States back is still sore. Elizebath reported seeing Dr. Sabra Heck last week for check-up.  Dr. Sabra Heck instructed her to take Lasix 60 mg daily and to take Zaroxolyn 2.5 mg if needed (when gaining 3 pounds or more) and to take it every other day until weight back to baseline. 30 day review. Continue with ITP unless changes noted by Medical Director at [place] of  review. Isobel called and said she is sorry but she can't attend Cardiac Rehab today since she has back problems.    [place] Name 02/23/16 1003 03/13/16 0711         ITP Comments Erin has done well adding resistance to her exercise.  She has some back pain that interferes with regular attendance. 30 day review. Continue with ITP unless changes noted by Medical Director at [place] of review.  HAs been absent this month         Comments:

## 2016-03-13 NOTE — Progress Notes (Signed)
Cardiac Individual Treatment Plan  Patient Details  Name: [name] MRN: [id number] Date of Birth: 1934-12-01 Referring Provider:    Initial Encounter Date:  Flowsheet Row Cardiac Rehab from 09/25/2015 in [facility] Cardiac and Pulmonary Rehab  Date  09/25/15      Visit Diagnosis: S/P MVR (mitral valve replacement)  Chronic combined systolic and diastolic congestive heart failure (HCC)  S/P MVR (mitral valve repair)  Patient's Home Medications on Admission:  Current Outpatient Prescriptions:  .  acetaminophen (TYLENOL) 500 MG tablet, Take 500 mg by mouth every 6 (six) hours as needed for mild pain, moderate pain, fever or headache., Disp: , Rfl:  .  amiodarone (PACERONE) 200 MG tablet, Take 200 mg by mouth daily., Disp: , Rfl:  .  aspirin EC 81 MG tablet, Take 81 mg by mouth daily. , Disp: , Rfl:  .  furosemide (LASIX) 40 MG tablet, Take 60 mg by mouth daily., Disp: , Rfl:  .  gabapentin (NEURONTIN) 100 MG capsule, Take 1 capsule (100 mg total) by mouth at bedtime., Disp: 90 capsule, Rfl: 0 .  metoprolol succinate (TOPROL-XL) 25 MG 24 hr tablet, Take 1 tablet (25 mg total) by mouth daily., Disp: 30 tablet, Rfl: 2 .  Multiple Vitamin (MULTI-VITAMINS) TABS, Take 1 tablet by mouth daily. , Disp: , Rfl:  .  pantoprazole (PROTONIX) 40 MG tablet, Take 40 mg by mouth daily. , Disp: , Rfl:  .  potassium chloride (K-DUR) 10 MEQ tablet, Take by mouth., Disp: , Rfl:  .  spironolactone (ALDACTONE) 25 MG tablet, Take 25 mg by mouth daily., Disp: , Rfl:  .  traMADol (ULTRAM) 50 MG tablet, TAKE 1/2 TO 1 TABLET BY MOUTH TWICE A DAY AS NEEDED FOR PAIN, Disp: , Rfl: 5 .  vitamin C (ASCORBIC ACID) 500 MG tablet, Take 500 mg by mouth daily as needed (for cold symptoms). , Disp: , Rfl:  .  warfarin (COUMADIN) 5 MG tablet, Take 1 tablet (5 mg total) by mouth daily., Disp: 30 tablet, Rfl: 2  Past Medical History: Past Medical History:  Diagnosis Date  . Arthritis   . Chronic atrial fibrillation  ([place])    a. On Coumadin  . 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 EF 25% by echo in 08/2015 b. RHC in 08/2015 showed normal filling pressures  . GERD (gastroesophageal reflux disease)   . Hypertension   . S/P MVR (mitral valve replacement)    a. MVR 1994 b. redo MVR in 08/2014 - on Coumadin  . Shortness of breath dyspnea     Tobacco Use: History  Smoking Status  . Former Smoker  Smokeless Tobacco  . Never Used    Labs: Recent Review Flowsheet Data    There is no flowsheet data to display.       Exercise Target Goals:    Exercise Program Goal: Individual exercise prescription set with THRR, safety & activity barriers. Participant demonstrates ability to understand and report RPE using BORG scale, to self-measure pulse accurately, and to acknowledge the importance of the exercise prescription.  Exercise Prescription Goal: Starting with aerobic activity 30 plus minutes a day, 3 days per week for initial exercise prescription. Provide home exercise prescription and guidelines that participant acknowledges understanding prior to discharge.  Activity Barriers & Risk Stratification:     Activity Barriers & Cardiac Risk Stratification - 09/25/15 1531      Activity Barriers & Cardiac Risk Stratification   Activity Barriers Arthritis;Joint  Problems   Cardiac Risk Stratification High      6 Minute Walk:     6 Minute Walk    Row Name 09/25/15 1509         6 Minute Walk   Phase Initial  NuStep Test      Distance 413 feet  Nu Step Test Level 1 with arms     Walk Time 6 minutes     # of Rest Breaks 0     RPE 13     Symptoms No     Resting HR 111 bpm     Resting BP 132/60     Max Ex. HR 116 bpm     Max Ex. BP 138/54        Initial Exercise Prescription:     Initial Exercise Prescription - 09/25/15 1500      Date of Initial Exercise RX and Referring Provider   Date 09/25/15     Treadmill   MPH 1  Use  caution with treadmill and small time intervals   Grade 0   Minutes 3     Recumbant Bike   Level 2   RPM 40   Watts 15   Minutes 10     NuStep   Level 2   Watts 20   Minutes 15     Arm Ergometer   Level 1   Watts 10   Minutes 15     Arm/Foot Ergometer   Level 4   Watts 12   Minutes 15     Recumbant Elliptical   Level 1   RPM 35   Watts 10   Minutes 10     REL-XR   Level 2   Watts 30   Minutes 15     T5 Nustep   Level 2   Watts 15   Minutes 15     Biostep-RELP   Level 2   Watts 15   Minutes 15     Prescription Details   Frequency (times per week) 3   Duration Progress to 30 minutes of continuous aerobic without signs/symptoms of physical distress     Intensity   THRR REST +  30   Ratings of Perceived Exertion 11-15     Progression   Progression Continue progressive overload as per policy without signs/symptoms or physical distress.     Resistance Training   Training Prescription Yes   Weight 2   Reps 10-15      Perform Capillary Blood Glucose checks as needed.  Exercise Prescription Changes:     Exercise Prescription Changes    Row Name 12/29/15 1000 01/11/16 1200 01/25/16 1400 02/09/16 1000 02/23/16 0900     Exercise Review   Progression  - Yes Yes Yes Yes     Response to Exercise   Blood Pressure (Admit) 114/56 142/80 122/60 130/70 132/70   Blood Pressure (Exercise) 102/50 112/52 128/70 154/70 138/60   Blood Pressure (Exit) 122/64 116/60 132/80 134/70 124/62   Heart Rate (Admit) 58 bpm 58 bpm 59 bpm 64 bpm 69 bpm   Heart Rate (Exercise) 60 bpm 78 bpm 58 bpm 80 bpm 81 bpm   Heart Rate (Exit) 66 bpm 54 bpm 44 bpm 62 bpm 59 bpm   Rating of Perceived Exertion (Exercise) 13 13 13 15 15    Duration Progress to 30 minutes of continuous aerobic without signs/symptoms of physical distress Progress to 45 minutes of aerobic exercise without signs/symptoms of physical distress Progress to 45 minutes  of aerobic exercise without signs/symptoms of  physical distress Progress to 45 minutes of aerobic exercise without signs/symptoms of physical distress Progress to 45 minutes of aerobic exercise without signs/symptoms of physical distress   Intensity THRR unchanged  - THRR unchanged THRR unchanged THRR New  70-100     Progression   Progression Continue to progress workloads to maintain intensity without signs/symptoms of physical distress. Continue to progress workloads to maintain intensity without signs/symptoms of physical distress.  - Continue to progress workloads to maintain intensity without signs/symptoms of physical distress. Continue to progress workloads to maintain intensity without signs/symptoms of physical distress.     Resistance Training   Training Prescription Yes Yes Yes Yes Yes   Weight 2 1 1 1 1    Reps 10-15 10-15 10-15 10-15 10-15     Interval Training   Interval Training  -  -  - No No     Treadmill   MPH 1  -  -  -  -   Grade 0  -  -  -  -   Minutes 5  -  -  -  -     NuStep   Level 4 4 4 5 7    Watts  - 24  -  -  -   Minutes 25 35  - 35 40      Exercise Comments:     Exercise Comments    Row Name 10/20/15 1158 11/16/15 1540 12/29/15 1020 01/11/16 1245 01/22/16 1743   Exercise Comments [name] has not attended Heart Track since 09-27-15. Goal will be to resume program when able. [name] has not attended since 3-29. [name] has returned after a 3 month absence.  Her exercise prescription was reviewed as well as equipment safety. [name] is progresing well with exercise duration and has added TM for short periods. [name] has had hip pain while standing. Avoided the TM today due to this pain but tolerated the NS machine well with no pain during exercise. She is working with her doctor reguarding her hip pain.    [name] Name 01/25/16 1444 01/25/16 1643 01/25/16 1720 02/23/16 1001     Exercise Comments [name] continues to do well with exercise. [name] said she wished they didn't rewrap her leg yesterday  since it was uncomfortable when they rewrapped it. [name] is doing ok on the Nustep with her legs today. I will fax heart rhythm strips to Dr. [name] after this class. Heart rate 44-58 at times even exercising. No c/o except today a little tired since she was awake in the middle of the night.  [name] has done well adding resistance to her exercise.  She has some back pain that interferes with regular attendance.       Discharge Exercise Prescription (Final Exercise Prescription Changes):     Exercise Prescription Changes - 02/23/16 0900      Exercise Review   Progression Yes     Response to Exercise   Blood Pressure (Admit) 132/70   Blood Pressure (Exercise) 138/60   Blood Pressure (Exit) 124/62   Heart Rate (Admit) 69 bpm   Heart Rate (Exercise) 81 bpm   Heart Rate (Exit) 59 bpm   Rating of Perceived Exertion (Exercise) 15   Duration Progress to 45 minutes of aerobic exercise without signs/symptoms of physical distress   Intensity THRR New  70-100     Progression   Progression Continue to progress workloads to maintain intensity without signs/symptoms of physical distress.  Resistance Training   Training Prescription Yes   Weight 1   Reps 10-15     Interval Training   Interval Training No     NuStep   Level 7   Minutes 40      Nutrition:  Target Goals: Understanding of nutrition guidelines, daily intake of sodium <1562m, cholesterol <2031m calories 30% from fat and 7% or less from saturated fats, daily to have 5 or more servings of fruits and vegetables.  Biometrics:     Pre Biometrics - 09/25/15 1508      Pre Biometrics   Height 5' 5.5" (1.664 m)   Weight 186 lb 1.6 oz (84.4 kg)   Waist Circumference 34 inches   Hip Circumference 42 inches   Waist to Hip Ratio 0.81 %   BMI (Calculated) 30.6       Nutrition Therapy Plan and Nutrition Goals:     Nutrition Therapy & Goals - 10/04/15 1707      Personal Nutrition Goals   Comments Carrie.  Rebello prefers not to meet with RD       Nutrition Discharge: Rate Your Plate Scores:     Nutrition Assessments - 09/28/15 1421      Rate Your Plate Scores   Pre Score 65   Pre Score % 72 %      Nutrition Goals Re-Evaluation:   Psychosocial: Target Goals: Acknowledge presence or absence of depression, maximize coping skills, provide positive support system. Participant is able to verbalize types and ability to use techniques and skills needed for reducing stress and depression.  Initial Review & Psychosocial Screening:     Initial Psych Review & Screening - 09/25/15 15LunaYes      Quality of Life Scores:     Quality of Life - 09/25/15 1541      Quality of Life Scores   Health/Function Pre 19.5 %   Socioeconomic Pre 26.75 %   Psych/Spiritual Pre 20.64 %   Family Pre 24.88 %   GLOBAL Pre 21.59 %      PHQ-9: Recent Review Flowsheet Data    [facility] 09/25/2015 01/05/2015   Decreased Interest 0 0   Down, Depressed, Hopeless 0 0   PHQ - 2 Score 0 0   Altered sleeping 3 1   Tired, decreased energy 3 1   Change in appetite 0 0   Feeling bad or failure about yourself  0 0   Trouble concentrating 0 0   Moving slowly or fidgety/restless 0 0   Suicidal thoughts 0 0   PHQ-9 Score 6 2   Difficult doing work/chores Somewhat difficult -      Psychosocial Evaluation and Intervention:     Psychosocial Evaluation - 12/27/15 1714      Psychosocial Evaluation & Interventions   Comments Counselor met with Carrie. SkVejaror a new psychosocial evaluation.  Carrie S was in this same program a little over a year ago and is now back due to some recent health issues.  She continues to have a strong support system with a daughter living in the same home and a son that is close by and very involved.  Carrie. SkBuzzellieports that she has had to contend a great deal with swelling in her body and was in the hospital for this in  March.  She has subsequently had surgery on her leg to remove a growth there and is going  to the wound center often for treatment of that.  Carrie. Chauncey Cruel reports she is sleeping better lately and has a good appetite.  She denies a history of depression or anxiety, but admits she does have some stress in her life with her adult children arguing quite often over her care.  Her goals for this program are to get the swelling down so the wounds will heal better.  Counselor will follow with Carrie. S as needed.        Psychosocial Re-Evaluation:     Psychosocial Re-Evaluation    Row Name 09/28/15 1422 12/11/15 1323 01/11/16 1227 02/07/16 1719 02/19/16 1855     Psychosocial Re-Evaluation   Comments When I checked EPIC to chart Rate your plate, I noticed Willadean Guyton is in the [place] today.  Rhyder's son called to say Layney fell and broker her tail bone and is very sore. I called and spoke to Mrs. Alverson and  she said she is sorry she can't attend Cardiac Rehab. but she is sore and on pain medicine. I tried to call her son back at [phone] but his vm was full.  [name] left a vm that she is sorry that she has not been able to attend Cardiac Rehab due to several infections.  Counselor follow up with Carrie. Brandt today reporting she is feeling better overall and her swelling has reduced some since coming to this class.  She stated ongoing treatment at the wound center has helped and she is feeling stronger while working out in this program.  Carrie. Chauncey Cruel reports the stress is less with her adult children arguing over her care now; or she has just learned to "stop worrying about it!"  counselor commended Carrie. Raynor for her hard work and commitment to consistent exercise.   Keyna called and said she is sorry but she can't attend Cardiac Rehab today since she has back problems.    Continued Psychosocial Services Needed Yes  -  -  -  -   Row Name 03/13/16 [id number]             Psychosocial  Re-Evaluation   Comments I called Kelvin today and she is frustrated that she can not return to Cardiac Rehab due to her health problems right now.           Vocational Rehabilitation: Provide vocational rehab assistance to qualifying candidates.   Vocational Rehab Evaluation & Intervention:     Vocational Rehab - 09/25/15 1532      Initial Vocational Rehab Evaluation & Intervention   Assessment shows need for Vocational Rehabilitation No      Education: Education Goals: Education classes will be provided on a weekly basis, covering required topics. Participant will state understanding/return demonstration of topics presented.  Learning Barriers/Preferences:   Education Topics: General Nutrition Guidelines/Fats and Fiber: -Group instruction provided by verbal, written material, models and posters to present the general guidelines for heart healthy nutrition. Gives an explanation and review of dietary fats and fiber. Flowsheet Row Cardiac Rehab from 02/14/2016 in [facility] Cardiac and Pulmonary Rehab  Date  01/22/16  Educator  PI  Instruction Review Code  2- meets goals/outcomes      Controlling Sodium/Reading Food Labels: -Group verbal and written material supporting the discussion of sodium use in heart healthy nutrition. Review and explanation with models, verbal and written materials for utilization of the food label. Flowsheet Row Cardiac Rehab from 02/14/2016 in [facility] Cardiac and Pulmonary Rehab  Date  01/29/16  Educator  PI  Instruction Review Code  2- meets goals/outcomes      Exercise Physiology & Risk Factors: - Group verbal and written instruction with models to review the exercise physiology of the cardiovascular system and associated critical values. Details cardiovascular disease risk factors and the goals associated with each risk factor. Flowsheet Row Cardiac Rehab from 02/14/2016 in [facility] Cardiac and Pulmonary Rehab  Date  02/05/16  Educator  [facility]  Instruction  Review Code  2- meets goals/outcomes      Aerobic Exercise & Resistance Training: - Gives group verbal and written discussion on the health impact of inactivity. On the components of aerobic and resistive training programs and the benefits of this training and how to safely progress through these programs. Flowsheet Row Cardiac Rehab from 02/14/2016 in [facility] Cardiac and Pulmonary Rehab  Date  02/07/16  Educator  Nada Maclachlan  Instruction Review Code  2- meets goals/outcomes      Flexibility, Balance, General Exercise Guidelines: - Provides group verbal and written instruction on the benefits of flexibility and balance training programs. Provides general exercise guidelines with specific guidelines to those with heart or lung disease. Demonstration and skill practice provided. Flowsheet Row Cardiac Rehab from 02/14/2016 in [facility] Cardiac and Pulmonary Rehab  Date  02/12/16  Educator  AS  Instruction Review Code  2- meets goals/outcomes      Stress Management: - Provides group verbal and written instruction about the health risks of elevated stress, cause of high stress, and healthy ways to reduce stress. Flowsheet Row Cardiac Rehab from 02/14/2016 in [facility] Cardiac and Pulmonary Rehab  Date  02/14/16  Educator  [facility]  Instruction Review Code  2- meets goals/outcomes      Depression: - Provides group verbal and written instruction on the correlation between heart/lung disease and depressed mood, treatment options, and the stigmas associated with seeking treatment. Flowsheet Row Cardiac Rehab from 02/14/2016 in [facility] Cardiac and Pulmonary Rehab  Date  11/09/14  Educator  [facility]  Instruction Review Code  2- meets goals/outcomes      Anatomy & Physiology of the Heart: - Group verbal and written instruction and models provide basic cardiac anatomy and physiology, with the coronary electrical and arterial systems. Review of: AMI, Angina, Valve disease, Heart Failure, Cardiac Arrhythmia, Pacemakers,  and the ICD. Flowsheet Row Cardiac Rehab from 02/14/2016 in [facility] Cardiac and Pulmonary Rehab  Date  12/25/15  Educator  DW  Instruction Review Code  2- meets goals/outcomes      Cardiac Procedures: - Group verbal and written instruction and models to describe the testing methods done to diagnose heart disease. Reviews the outcomes of the test results. Describes the treatment choices: Medical Management, Angioplasty, or Coronary Bypass Surgery. Flowsheet Row Cardiac Rehab from 02/14/2016 in [facility] Cardiac and Pulmonary Rehab  Date  11/07/14  Educator  Foster Simpson RN BSN CCRP  Instruction Review Code  2- meets goals/outcomes      Cardiac Medications: - Group verbal and written instruction to review commonly prescribed medications for heart disease. Reviews the medication, class of the drug, and side effects. Includes the steps to properly store meds and maintain the prescription regimen. Flowsheet Row Cardiac Rehab from 02/14/2016 in [facility] Cardiac and Pulmonary Rehab  Date  01/15/16 Marisue Humble 2]  Educator  DW  Instruction Review Code  2- meets goals/outcomes      Go Sex-Intimacy & Heart Disease, Get SMART - Goal Setting: - Group verbal and written instruction through game format to discuss heart  disease and the return to sexual intimacy. Provides group verbal and written material to discuss and apply goal setting through the application of the S.M.A.R.T. Method. Flowsheet Row Cardiac Rehab from 02/14/2016 in [facility] Cardiac and Pulmonary Rehab  Date  11/07/14  Educator  Foster Simpson RN BSN CCRP  Instruction Review Code  2- meets goals/outcomes      Other Matters of the Heart: - Provides group verbal, written materials and models to describe Heart Failure, Angina, Valve Disease, and Diabetes in the realm of heart disease. Includes description of the disease process and treatment options available to the cardiac patient. Flowsheet Row Cardiac Rehab from 02/14/2016 in [facility] Cardiac and Pulmonary Rehab   Date  12/14/14  Educator  CE  Instruction Review Code  2- meets goals/outcomes      Exercise & Equipment Safety: - Individual verbal instruction and demonstration of equipment use and safety with use of the equipment. Flowsheet Row Cardiac Rehab from 02/14/2016 in [facility] Cardiac and Pulmonary Rehab  Date  09/25/15  Educator  C. EnterkinRN  Instruction Review Code  1- partially meets, needs review/practice      Infection Prevention: - Provides verbal and written material to individual with discussion of infection control including proper hand washing and proper equipment cleaning during exercise session. Flowsheet Row Cardiac Rehab from 02/14/2016 in [facility] Cardiac and Pulmonary Rehab  Date  09/25/15  Educator  C. EnterkinRN  Instruction Review Code  2- meets goals/outcomes      Falls Prevention: - Provides verbal and written material to individual with discussion of falls prevention and safety. Flowsheet Row Cardiac Rehab from 02/14/2016 in [facility] Cardiac and Pulmonary Rehab  Date  09/25/15  Educator  C. Cale  Instruction Review Code  1- partially meets, needs review/practice      Diabetes: - Individual verbal and written instruction to review signs/symptoms of diabetes, desired ranges of glucose level fasting, after meals and with exercise. Advice that pre and post exercise glucose checks will be done for 3 sessions at entry of program.    Knowledge Questionnaire Score:     Knowledge Questionnaire Score - 09/25/15 1532      Knowledge Questionnaire Score   Pre Score 18      Core Components/Risk Factors/Patient Goals at Admission:     Personal Goals and Risk Factors at Admission - 09/25/15 1536      Core Components/Risk Factors/Patient Goals on Admission   Increase Strength and Stamina Yes   Intervention Provide advice, education, support and counseling about physical activity/exercise needs.;Develop an individualized exercise prescription for aerobic and  resistive training based on initial evaluation findings, risk stratification, comorbidities and participant's personal goals.   Expected Outcomes Achievement of increased cardiorespiratory fitness and enhanced flexibility, muscular endurance and strength shown through measurements of functional capacity and personal statement of participant.   Heart Failure Yes   Intervention Provide a combined exercise and nutrition program that is supplemented with education, support and counseling about heart failure. Directed toward relieving symptoms such as shortness of breath, decreased exercise tolerance, and extremity edema.   Expected Outcomes Improve functional capacity of life;Short term: Attendance in program 2-3 days a week with increased exercise capacity. Reported lower sodium intake. Reported increased fruit and vegetable intake. Reports medication compliance.;Short term: Daily weights obtained and reported for increase. Utilizing diuretic protocols set by physician.;Long term: Adoption of self-care skills and reduction of barriers for early signs and symptoms recognition and intervention leading to self-care maintenance.      Core Components/Risk  Factors/Patient Goals Review:      Goals and Risk Factor Review    Row Name 10/18/15 1900 12/11/15 1322 01/15/16 1758 02/08/16 1230 03/13/16 0845     Core Components/Risk Factors/Patient Goals Review   Personal Goals Review  - Sedentary Heart Failure Heart Failure  -   Review Was in  hospital [name] son called to say [name] fell and broker her tail bone and is very sore. She said she is sorry she can't attend Cardiac Rehab. but she is sore and on pain medicine. I tried to call her son back at [phone] but his vm was full.  Abryana sees Dr Sabra Heck every 1-2 weeks to monitor CHF. Terressa checks her weight daily and takes Lasix per her Drs. instructions.   I called [name] to check on her. [name] reported that she almost called the  ambulance last night since she has been up all night not feeling well. "I think my kidneys are shutting down. I have an appt with Dr. [name] tomorrow. I am trying to stop taking the tramadol since I think I am getting addicted to it.". [name] said she really wants to come back to Cardiac Rehab but she can't right now. "   Expected Outcomes  - Able to return to Cardiac Rehab eventually.  [name] will comtinue to control CHF by following Dr.s advice and using meds as directed. [name] will keep her HF under control. [name] has a follow up appt with Dr. Sabra Heck and hopes to control her heart failure better.       Core Components/Risk Factors/Patient Goals at Discharge (Final Review):      Goals and Risk Factor Review - 03/13/16 0845      Core Components/Risk Factors/Patient Goals Review   Review I called [name] to check on her. Autry reported that she almost called the ambulance last night since she has been up all night not feeling well. "I think my kidneys are shutting down. I have an appt with Dr. [name] tomorrow. I am trying to stop taking the tramadol since I think I am getting addicted to it.". Belmira Daley said she really wants to come back to Cardiac Rehab but she can't right now. "   Expected Outcomes Laiba has a follow up appt with Dr. Sabra Heck and hopes to control her heart failure better.       ITP Comments:     ITP Comments    Row Name 09/26/15 0825 09/28/15 1422 10/22/15 1012 11/19/15 1112 12/11/15 1318   ITP Comments 30 day review   Continue with ITP    New start to program has attended medical review orientation. When I checked EPIC to chart Rate your plate, I noticed Magdalyn Arenivas is in the [facility] Emerg Dept. 30 Day review. Continue with ITP  Last visit March 29,2017 30 day review. Continue with ITP. Remains out after hospital admit for HF.  Waiting for MD clearance. [name] son called to say Raziyah fell and broker her tail bone and is  very sore. She said she is sorry she can't attend Cardiac Rehab. but she is sore and on pain medicine. I tried to call her son back at [phone] but his vm was full.    [place] Name 12/14/15 1438 12/20/15 0908 01/11/16 1226 01/17/16 0820 01/18/16 1348   ITP Comments patient has not returned to cardiac rehab - last session 09/27/15 30 day review.  Continue with ITP   Remains out, expected to return since MD  has released her for exercise Carrie Wilson left a vm that she is sorry that she has not been able to attend Cardiac Rehab due to several infections.  30 day review. Continue with ITP. Has returned to program 30 day review. Continue with ITP. Has returned to program   [place] Name 01/25/16 1640 02/01/16 1640 02/08/16 1805 02/14/16 0843 02/19/16 1855   ITP Comments Eustolia said she is a little tired today since she got up in the middle of the night to add something to a bean dishe she was making overnight for a friend of mine. Whitney said she wished they didn't rewrap her leg yesterday since it was uncomfortable when they rewrapped it. Kytzia is doing ok on the Nustep with her legs today. Felicha stated she felt discomfort between shoulder blades last night and went to her physician today.  EKG performed with no signs of concern.  States back is still sore. Harold reported seeing Dr. Sabra Heck last week for check-up.  Dr. Sabra Heck instructed her to take Lasix 60 mg daily and to take Zaroxolyn 2.5 mg if needed (when gaining 3 pounds or more) and to take it every other day until weight back to baseline. 30 day review. Continue with ITP unless changes noted by Medical Director at signature of review. Calandria called and said she is sorry but she can't attend Cardiac Rehab today since she has back problems.    [place] Name 02/23/16 1003 03/13/16 0711 03/13/16 0844       ITP Comments Marrietta has done well adding resistance to her exercise.  She has some back pain that interferes with regular attendance. 30 day  review. Continue with ITP unless changes noted by Medical Director at signature of review.  HAs been absent this month I called [name] to check on her. Sadiya reported that she almost called the ambulance last night since she has been up all night not feeling well. "I think my kidneys are shutting down. I have an appt with Dr. [name] tomorrow. I am trying to stop taking the tramadol since I think I am getting addicted to it.". Mirayah Wren said she really wants to come back to Cardiac Rehab but she can't right now. "        Comments:Discharge ITP

## 2016-03-13 NOTE — Telephone Encounter (Signed)
I called [name] to check on her. [name] reported that she almost called the ambulance last night since she has been up all night not feeling well. "I think my kidneys are shutting down. I have an appt with Dr. [name] tomorrow. I am trying to stop taking the tramadol since I think I am getting addicted to it.". [name] said she really wants to come back to Cardiac Rehab but she can't right now. "

## 2016-03-14 ENCOUNTER — Other Ambulatory Visit: Payer: Self-pay | Admitting: Internal Medicine

## 2016-03-14 DIAGNOSIS — S22000A Wedge compression fracture of unspecified thoracic vertebra, initial encounter for closed fracture: Secondary | ICD-10-CM

## 2016-03-14 DIAGNOSIS — S32000A Wedge compression fracture of unspecified lumbar vertebra, initial encounter for closed fracture: Secondary | ICD-10-CM

## 2016-03-14 NOTE — Progress Notes (Addendum)
VAIL, BASISTA ([id number]) Visit Report for 03/13/2016 Arrival Information Details Patient Name: Carrie, [name]. Date of Service: 03/13/2016 3:15 PM Medical Record Patient Account Number: [id number] Number: Treating RN: [name] 08-29-1934 (81 y.o. Other Clinician: Date of Birth/Sex: Female) Treating [name], [name] Primary Care Physician/Extender: Claudette Laws Physician: Referring Physician: [name] in Treatment: 7 Visit Information History Since Last Visit All ordered tests and consults were completed: No Patient Arrived: [name] Added or deleted any medications: No Arrival Time: 15:49 Any new allergies or adverse reactions: No Accompanied By: daughter Had a fall or experienced change in No Transfer Assistance: EasyPivot Patient activities of daily living that may affect Lift risk of falls: Patient Identification Verified: Yes Signs or symptoms of abuse/neglect since last No Secondary Verification Process Yes visito Completed: Hospitalized since last visit: No Patient Requires Transmission- No Pain Present Now: No Based Precautions: Patient Has Alerts: Yes Patient Alerts: Patient on Blood Thinner [place] Signature(s) Signed: 03/13/2016 5:16:17 PM By: [name] Entered By: [name] on 03/13/2016 15:53:20 [name] ([id number]) -------------------------------------------------------------------------------- Encounter Discharge Information Details Patient Name: Carrie Mulligan C. Date of Service: 03/13/2016 3:15 PM Medical Record Patient Account Number: [id number] Number: Treating RN: [name] July 22, 1934 (81 y.o.o. Other Clinician: Date of Birth/Sex: Female) Treating [name], [name] Primary Care Physician/Extender: Claudette Laws Physician: Referring Physician: [name] in Treatment: 16 Encounter Discharge Information  Items Discharge Pain Level: 0 Discharge Condition: Stable Ambulatory Status: Walker Discharge Destination: Home Transportation: Private Auto Accompanied By: daughter Schedule Follow-up Appointment: Yes Medication Reconciliation completed Yes and provided to Patient/Care [name] Electronic Signature(s) Signed: 03/13/2016 4:48:21 PM By: Ruthine Dose Entered By: Ruthine Dose on 03/13/2016 16:48:20 [name] ([id number]) -------------------------------------------------------------------------------- Lower Extremity Assessment Details Patient Name: Carrie Mulligan C. Date of Service: 03/13/2016 3:15 PM Medical Record Patient Account Number: [id number] Number: Treating RN: [name] 1934/12/14 (81 y.o. Other Clinician: Date of Birth/Sex: Female) Treating [name], [name] Primary Care Physician/Extender: Claudette Laws Physician: Referring Physician: [name] in Treatment: 16 Edema Assessment Assessed: [Left: No] [Right: No] E[Left: dema] [Right: :] Calf Left: Right: Point of Measurement: 36 cm From Medial Instep cm 31.6 cm Ankle Left: Right: Point of Measurement: 10 cm From Medial Instep cm 19.8 cm Vascular Assessment Pulses: Posterior Tibial Dorsalis Pedis Palpable: [Right:Yes] Extremity colors, hair growth, and conditions: Extremity Color: [Right:Mottled] Temperature of Extremity: [Right:Warm] Capillary Refill: [Right:< 3 seconds] Electronic Signature(s) Signed: 03/13/2016 5:16:17 PM By: [name] Entered By: [name] on 03/13/2016 16:08:05 [name] ([id number]) -------------------------------------------------------------------------------- Multi Wound Chart Details Patient Name: Carrie Mulligan C. Date of Service: 03/13/2016 3:15 PM Medical Record Patient Account Number: [id number] Number: Treating  RN: [name] [birth year] (81 y.o. Other Clinician: Date of Birth/Sex: Female) Treating [name], [name] Primary Care Physician/Extender: Claudette Laws Physician: Referring Physician: [name] in Treatment: 16 Vital Signs Height(in): 68 Pulse(bpm): 68 Weight(lbs): 178 Blood Pressure 155/70 (mmHg): Body Mass Index(BMI): 27 Temperature(F): Respiratory Rate 20 (breaths/min): Wound Assessments Treatment Notes Electronic Signature(s) Signed: 03/13/2016 5:16:17 PM By: [name] Entered By: [name] on 03/13/2016 16:11:36 [name] ([id number]) -------------------------------------------------------------------------------- Multi-Disciplinary Care Plan Details Patient Name: Carrie Kussmaul. Date of Service: 03/13/2016 3:15 PM Medical Record Patient Account Number: [id number] Number: Treating RN: [name] [birth year] (81 y.o. Other Clinician: Date of Birth/Sex: Female) Treating [name], [name] Primary Care Physician/Extender: Claudette Laws Physician:  Referring Physician: [name] in Treatment: 16 Active Inactive Orientation to the Wound Care Program Nursing Diagnoses: Knowledge deficit related to the wound healing center program Goals: Patient/caregiver will verbalize understanding of the [place] Program Date Initiated: 11/22/2015 Goal Status: Active Interventions: Provide education on orientation to the wound center Notes: Venous Leg Ulcer Nursing Diagnoses: Knowledge deficit related to disease process and management Potential for venous Insuffiency (use before diagnosis confirmed) Goals: Patient will maintain optimal edema control Date Initiated: 11/22/2015 Goal Status: Active Patient/caregiver will verbalize understanding of disease process and disease management Date Initiated: 11/22/2015 Goal Status: Active Verify adequate tissue perfusion prior to therapeutic compression  application Date Initiated: 11/22/2015 Goal Status: Active Interventions: Assess peripheral edema status every visit. [name] ([id number]) Compression as ordered Provide education on venous insufficiency Treatment Activities: Non-invasive vascular studies : 11/22/2015 Therapeutic compression applied : 11/22/2015 Notes: Wound/Skin Impairment Nursing Diagnoses: Impaired tissue integrity Knowledge deficit related to ulceration/compromised skin integrity Goals: Patient/caregiver will verbalize understanding of skin care regimen Date Initiated: 11/22/2015 Goal Status: Active Ulcer/skin breakdown will have a volume reduction of 30% by week 4 Date Initiated: 11/22/2015 Goal Status: Active Ulcer/skin breakdown will have a volume reduction of 50% by week 8 Date Initiated: 11/22/2015 Goal Status: Active Ulcer/skin breakdown will have a volume reduction of 80% by week 12 Date Initiated: 11/22/2015 Goal Status: Active Ulcer/skin breakdown will heal within 14 weeks Date Initiated: 11/22/2015 Goal Status: Active Interventions: Assess patient/caregiver ability to perform ulcer/skin care regimen upon admission and as needed Assess ulceration(s) every visit Provide education on ulcer and skin care Treatment Activities: Skin care regimen initiated : 11/22/2015 Topical wound management initiated : 11/22/2015 Notes: Electronic Signature(s) Signed: 03/13/2016 5:16:17 PM By: De Burrs, [name] ([id number]) Entered By: [name] on 03/13/2016 16:11:25 Caroll, [name] ([id number]) -------------------------------------------------------------------------------- Pain Assessment Details Patient Name: Carrie Mulligan C. Date of Service: 03/13/2016 3:15 PM Medical Record Patient Account Number: [id number] Number: Treating RN: [name] 1935/04/25 (80 y.o. Other Clinician: Date of Birth/Sex: Female) Treating [name], [name] Primary Care  Physician/Extender: Claudette Laws Physician: Referring Physician: [name] in Treatment: 16 Active Problems Location of Pain Severity and Description of Pain Patient Has Paino No Site Locations With Dressing Change: No Pain Management and Medication Current Pain Management: Electronic Signature(s) Signed: 03/13/2016 5:16:17 PM By: [name] Entered By: [name] on 03/13/2016 15:53:26 Karrer, [name] ([id number]) -------------------------------------------------------------------------------- Patient/Caregiver Education Details Patient Name: Carrie Kussmaul. Date of Service: 03/13/2016 3:15 PM Medical Record Patient Account Number: [id number] Number: Treating RN: [name] 1934-08-10 (80 y.o. Other Clinician: Date of Birth/Gender: Female) Treating [name], [name] Primary Care Physician/Extender: Claudette Laws Physician: Suella [place] in Treatment: 16 Referring Physician: [name] Education Assessment Education Provided To: Patient Education Topics Provided Wound/Skin Impairment: Handouts: Other: do not get wrap wet Methods: Demonstration, Explain/Verbal Responses: State content correctly Electronic Signature(s) Signed: 03/13/2016 5:16:17 PM By: [name] Entered By: [name] on 03/13/2016 16:10:36 Stong, [name] ([id number]) -------------------------------------------------------------------------------- Wound Assessment Details Patient Name: Carrie Mulligan C. Date of Service: 03/13/2016 3:15 PM Medical Record Patient Account Number: [id number] Number: Treating RN: [name] 07-25-1934 (80 y.o. Other Clinician: Date of Birth/Sex: Female) Treating [name], [name] Primary Care Physician/Extender: Claudette Laws Physician: Referring Physician: [name] in Treatment: 16 Wound Status Wound Number: 2 Primary Lymphedema Etiology: Wound Location: Right Lower Leg -  Lateral Wound Status: Open Wounding Event: Gradually Appeared Comorbid Cataracts, Arrhythmia, Hypotension, Date Acquired: 07/04/2015 History: Osteoarthritis Weeks  Of Treatment: 16 Clustered Wound: No Photos Photo Uploaded By: [name] on 03/13/2016 17:04:33 Wound Measurements Length: (cm) 0.1 % Reduction i Width: (cm) 0.1 % Reduction i Depth: (cm) 0.1 Epithelializa Area: (cm) 0.008 Tunneling: Volume: (cm) 0.001 Undermining: n Area: 99.4% n Volume: 99.3% tion: None No No Wound Description Classification: Partial Thickness Wound Margin: Distinct, outline attached Exudate Amount: Small Exudate Type: Serous Exudate Color: amber Foul Odor After Cleansing: No Wound Bed Granulation Amount: Large (67-100%) Exposed Structure Granulation Quality: Pink Fascia Exposed: No Gutter, Maevyn C. ([id number]) Necrotic Amount: None Present (0%) Fat Layer Exposed: No Tendon Exposed: No Muscle Exposed: No Joint Exposed: No Bone Exposed: No Limited to Skin Breakdown Periwound Skin Texture Texture Color No Abnormalities Noted: No No Abnormalities Noted: No Callus: No Atrophie Blanche: No Crepitus: No Cyanosis: No Excoriation: No Ecchymosis: No Fluctuance: No Erythema: No Friable: No Hemosiderin Staining: No Induration: No Mottled: No Localized Edema: Yes Pallor: No Rash: No Rubor: No Scarring: No Temperature / Pain Moisture Temperature: No Abnormality No Abnormalities Noted: No Tenderness on Palpation: Yes Dry / Scaly: No Maceration: No Moist: Yes Wound Preparation Ulcer Cleansing: Other: soap and water, Topical Anesthetic Applied: None Treatment Notes Wound #2 (Right, Lateral Lower Leg) 1. Cleansed with: Clean wound with Normal Saline Cleanse wound with antibacterial soap and water 2. Anesthetic Topical Lidocaine 4% cream to wound bed prior to debridement 3. Peri-wound Care: Moisturizing lotion 4. Dressing Applied: Hydrafera Blue 5. Secondary  Dressing Applied ABD Pad Dry Gauze 7. Secured with Tape 4-Layer Compression System - Right Lower Extremity AASTHA, DAYLEY ([id number]) Electronic Signature(s) Signed: 03/13/2016 5:16:17 PM By: [name] Entered By: [name] on 03/13/2016 16:26:25 [place], [name] ([id number]) -------------------------------------------------------------------------------- Wound Assessment Details Patient Name: [name], Naraly C. Date of Service: 03/13/2016 3:15 PM Medical Record Patient Account Number: [id number] Number: Treating RN: [name] [birth year] (80 y.o. Other Clinician: Date of Birth/Sex: Female) Treating [name], [name] Primary Care Physician/Extender: Claudette Laws Physician: Referring Physician: [name] in Treatment: 16 Wound Status Wound Number: 4 Primary Skin Tear Etiology: Wound Location: Right Lower Leg - Proximal Wound Status: Open Wounding Event: Shear/Friction Comorbid Cataracts, Arrhythmia, Hypotension, Date Acquired: 02/28/2016 History: Osteoarthritis Weeks Of Treatment: 2 Clustered Wound: No Photos Photo Uploaded By: [name] on 03/13/2016 17:04:33 Wound Measurements Length: (cm) 0.2 % Reduction i Width: (cm) 0.3 % Reduction i Depth: (cm) 0.1 Epithelializa Area: (cm) 0.047 Tunneling: Volume: (cm) 0.005 Undermining: n Area: 33.8% n Volume: 28.6% tion: None No No Wound Description Classification: Partial Thickness Wound Margin: Distinct, outline attached Exudate Amount: Large Exudate Type: Serosanguineous Exudate Color: red, brown Foul Odor After Cleansing: No Wound Bed Granulation Amount: None Present (0%) Exposed Structure Necrotic Amount: Large (67-100%) Fascia Exposed: No Favorite, Maysel C. ([id number]) Necrotic Quality: Eschar Fat Layer Exposed: No Tendon Exposed: No Muscle Exposed: No Joint Exposed: No Bone Exposed: No Limited to Skin Breakdown Periwound Skin  Texture Texture Color No Abnormalities Noted: No No Abnormalities Noted: No Moisture Temperature / Pain No Abnormalities Noted: No Temperature: No Abnormality Moist: Yes Tenderness on Palpation: Yes Wound Preparation Ulcer Cleansing: Other: soap and water, Topical Anesthetic Applied: Other: lidocaine 4%, Treatment Notes Wound #4 (Right, Proximal Lower Leg) 1. Cleansed with: Clean wound with Normal Saline Cleanse wound with antibacterial soap and water 2. Anesthetic Topical Lidocaine 4% cream to wound bed prior to debridement 3. Peri-wound Care: Moisturizing lotion 4. Dressing Applied: Hydrafera Blue 5. Secondary Dressing Applied ABD Pad Dry Gauze 7. Secured with Engineer, building services  Compression System - Right Lower Extremity Electronic Signature(s) Signed: 03/13/2016 5:16:17 PM By: [name] Entered By: [name] on 03/13/2016 16:26:49 [place], [name] ([id number]) -------------------------------------------------------------------------------- Wound Assessment Details Patient Name: [name], Keenan C. Date of Service: 03/13/2016 3:15 PM Medical Record Patient Account Number: [id number] Number: Treating RN: [name] Nov 07, 1934 (80 y.o. Other Clinician: Date of Birth/Sex: Female) Treating [name], [name] Primary Care Physician/Extender: Claudette Laws Physician: Referring Physician: [name] in Treatment: 16 Wound Status Wound Number: 5 Primary Skin Tear Etiology: Wound Location: Right Lower Leg - Distal Wound Status: Open Wounding Event: Shear/Friction Comorbid Cataracts, Arrhythmia, Hypotension, Date Acquired: 02/28/2016 History: Osteoarthritis Weeks Of Treatment: 2 Clustered Wound: No Photos Photo Uploaded By: [name] on 03/13/2016 17:04:52 Wound Measurements Length: (cm) 0.5 % Reduction i Width: (cm) 1.3 % Reduction i Depth: (cm) 0.1 Epithelializa Area: (cm) 0.511 Tunneling: Volume: (cm)  0.051 Undermining: n Area: 45.8% n Volume: 45.7% tion: None No No Wound Description Classification: Partial Thickness Wound Margin: Distinct, outline attached Exudate Amount: Large Exudate Type: Serosanguineous Exudate Color: red, brown Foul Odor After Cleansing: No Wound Bed Granulation Amount: Large (67-100%) Exposed Structure Granulation Quality: Red Fascia Exposed: No Leyda, Johna C. ([id number]) Necrotic Amount: None Present (0%) Fat Layer Exposed: No Tendon Exposed: No Muscle Exposed: No Joint Exposed: No Bone Exposed: No Limited to Skin Breakdown Periwound Skin Texture Texture Color No Abnormalities Noted: No No Abnormalities Noted: No Moisture Temperature / Pain No Abnormalities Noted: No Temperature: No Abnormality Moist: Yes Tenderness on Palpation: Yes Wound Preparation Ulcer Cleansing: Other: soap and water, Topical Anesthetic Applied: None Treatment Notes Wound #5 (Right, Distal Lower Leg) 1. Cleansed with: Clean wound with Normal Saline Cleanse wound with antibacterial soap and water 2. Anesthetic Topical Lidocaine 4% cream to wound bed prior to debridement 3. Peri-wound Care: Moisturizing lotion 4. Dressing Applied: Hydrafera Blue 5. Secondary Dressing Applied ABD Pad Dry Gauze 7. Secured with Tape 4-Layer Compression System - Right Lower Extremity Electronic Signature(s) Signed: 03/13/2016 5:16:17 PM By: [name] Entered By: [name] on 03/13/2016 16:27:12 Eisel, [name] ([id number]) -------------------------------------------------------------------------------- Vitals Details Patient Name: Carrie Mulligan C. Date of Service: 03/13/2016 3:15 PM Medical Record Patient Account Number: [id number] Number: Treating RN: [name] [birth year] (80 y.o. Other Clinician: Date of Birth/Sex: Female) Treating [name], [name] Primary Care Physician/Extender: Claudette Laws Physician: Referring  Physician: [name] in Treatment: 16 Vital Signs Time Taken: 15:55 Pulse (bpm): 68 Height (in): 68 Respiratory Rate (breaths/min): 20 Weight (lbs): 178 Blood Pressure (mmHg): 155/70 Body Mass Index (BMI): 27.1 Reference Range: 80 - 120 mg / dl Electronic Signature(s) Signed: 03/13/2016 5:16:17 PM By: [name] Entered By: [name] on 03/13/2016 15:57:03

## 2016-03-16 NOTE — Progress Notes (Signed)
BELLANIE, MATTHEW ([id number]) Visit Report for 03/13/2016 Chief Complaint Document Details ROISE, EMERT Date of Service: 03/13/2016 3:15 PM Patient Name: C. Patient Account Number: [id number] Medical Record Treating RN: [name] [id number] Number: Other Clinician: 11-25-1934 (81 y.o. Treating Ricco Dershem Date of Birth/Sex: Female) Physician/Extender: G Primary Care [name] Physician: Referring Physician: [name] in Treatment: 16 Information Obtained from: Patient Chief Complaint Patient is here for review of bilateral lower extremity wounds dating back to December 2016 Electronic Signature(s) Signed: 03/14/2016 7:42:49 AM By: [name] MD Entered By: [name] on 03/13/2016 19:51:57 [place], [name] ([id number]) -------------------------------------------------------------------------------- Debridement Details [name] Date of Service: 03/13/2016 3:15 PM Patient Name: C. Patient Account Number: [id number] Medical Record Treating RN: [name] [id number] Number: Other Clinician: 1935-03-22 (81 y.o. Treating Zyquan Crotty, [place] Date of Birth/Sex: Female) Physician/Extender: G Primary Care [name] Physician: Referring Physician: [name] in Treatment: 16 Debridement Performed for Wound #4 Right,Proximal Lower Leg Assessment: Performed By: Physician [name], MD Debridement: Debridement Pre-procedure Yes - 16:25 Verification/Time Out Taken: Start Time: 16:25 Pain Control: Lidocaine 4% Topical Solution Level: Skin/Subcutaneous Tissue Total Area Debrided (L x 0.2 (cm) x 0.3 (cm) = 0.06 (cm) W): Tissue and other Viable, Non-Viable, Exudate, Fibrin/Slough, Subcutaneous material debrided: Instrument: Curette Bleeding: Minimum Hemostasis Achieved: Pressure End Time: 16:26 Procedural Pain: 0 Post Procedural Pain: 0 Response to Treatment: Procedure was tolerated well Post Debridement  Measurements of Total Wound Length: (cm) 0.2 Width: (cm) 0.2 Depth: (cm) 0.1 Volume: (cm) 0.003 Character of Wound/Ulcer Post Stable Debridement: Severity of Tissue Post Debridement: Fat layer exposed Post Procedure Diagnosis Same as Pre-procedure Electronic Signature(s) KEASHA, MALKIEWICZ ([id number]) Signed: 03/14/2016 7:42:49 AM By: [name] MD Signed: 03/15/2016 4:43:58 PM By: [name] Previous Signature: 03/13/2016 5:16:17 PM Version By: [name] Entered By: [name] on 03/13/2016 19:49:52 Nation, [name] ([id number]) -------------------------------------------------------------------------------- Debridement Details [name] Date of Service: 03/13/2016 3:15 PM Patient Name: C. Patient Account Number: [id number] Medical Record Treating RN: [name] [id number] Number: Other Clinician: [birth year] (81 y.o. Treating Denali Sharma Date of Birth/Sex: Female) Physician/Extender: G Primary Care [name] Physician: Referring Physician: [name] in Treatment: 16 Debridement Performed for Wound #5 Right,Distal Lower Leg Assessment: Performed By: Physician [name], MD Debridement: Debridement Pre-procedure Yes - 16:25 Verification/Time Out Taken: Start Time: 16:27 Pain Control: Lidocaine 4% Topical Solution Level: Skin/Subcutaneous Tissue Total Area Debrided (L x 0.5 (cm) x 1.3 (cm) = 0.65 (cm) W): Tissue and other Viable, Non-Viable, Exudate, Fibrin/Slough, Subcutaneous material debrided: Instrument: Curette Bleeding: Minimum Hemostasis Achieved: Pressure End Time: 16:28 Procedural Pain: 0 Post Procedural Pain: 0 Response to Treatment: Procedure was tolerated well Post Debridement Measurements of Total Wound Length: (cm) 0.5 Width: (cm) 0.5 Depth: (cm) 0.1 Volume: (cm) 0.02 Character of Wound/Ulcer Post Stable Debridement: Severity of Tissue Post Debridement: Fat layer exposed Post  Procedure Diagnosis Same as Pre-procedure Electronic Signature(s) SADI, ARAVE ([id number]) Signed: 03/14/2016 7:42:49 AM By: [name] MD Signed: 03/15/2016 4:43:58 PM By: [name] Previous Signature: 03/13/2016 5:16:17 PM Version By: [name] Entered By: [name] on 03/13/2016 19:50:27 Depace, [name] ([id number]) -------------------------------------------------------------------------------- HPI Details [name] Date of Service: 03/13/2016 3:15 PM Patient Name: C. Patient Account Number: [id number] Medical Record Treating RN: [name] [id number] Number: Other Clinician: Nov 22, 1934 (81 y.o. Treating Zavian Slowey, [place] Date of Birth/Sex: Female) Physician/Extender: G [place] Physician: Referring Physician: [name] in Treatment: 16 History of Present Illness HPI Description: 11/22/15;  this is [place] is a 81 year old woman who lives at home on her own. According to the patient and her daughter was present she has had long-standing edema in her legs dating back many years. She also has a history of chronic systolic heart failure, atrial fibrillation and is status post mitral valve replacement. [facility] showed an ejection fraction of 40-45% she is on Lasix 60 mg a day and spironolactone 25 mg a day. Her current problem began in December around Christmas time she developed a small hematoma in the medial part of her left leg which rapidly expanded to a very large hematoma that required surgical debridement. This situation was complicated by the fact that the patient is on long-standing Coumadin for mechanical heart valve. She went to the OR had this evacuated on January 4 /17. The wound has gradually improved however she has developed a small wounds around this area and more recently a wound on the right lateral leg. She is weeping edema fluid. The patient is already been  to see vascular surgery. It was recommended that she wear Unna boots, she did not tolerate this due to pain in the left leg. She was then prescribed Juzo stockings and really can't get these on herself although truthfully there is probably too much edema for a Juzo stockings currently. She is not a diabetic and has no history of PAD or claudication that I could elicit. She does not use the external compression pumps reliably. She comes today with notes from her primary physician and Dr. Ronalee Belts both recommending various forms of compression but the patient does not really complied with them. Has been using the external compression pumps with some regularity but certainly not daily on the right leg and this has helped. I also note that her daughter tells me the history that she does not sleep in bed at home. She has a hospital bed but with her legs up she finds this painful so she sleeps in the couch sitting up with her legs dependent. 11/29/15; the patient is arrives today accompanied by her son. He expresses satisfaction that she is maintained the compression all week. 12/06/15; the patient has kept her Profore light wraps on, we have good edema control no major change in the wounds we have been using Aquacel 12/13/15; changed to RTD last week. One of the 3 wounds on her left medial leg is healed she has 2 remaining wounds here and one on the right lateral leg. 12/18/2015 -- the patient was at Dr. Nino Parsley office today and he was seeing her for an arterial study. While the wrap was being removed she had an inadvertent laceration of the left proximal anterior leg which bled quite profusely and a compression dressing was applied over this and the patient was here to get her Profore wraps done. I was asked to see the patient to make an assessment and treat appropriately. 12/26/15; the patient's injury on the left proximal leg and Steri-Strips removed after soaking. There is an open area here. The  original wounds to still open on the right lateral and left medial leg. 01/03/16 patient's injury on her proximal left leg looks quite good. Still small open area on the medial left leg which appears to be improving. The area on the right lateral leg still substantially open with no real improvement in wound depth. Her edema control is marginal with a Profore light. We have been using RTD Gerardo, Azoria C. ([id number]) for 3-4 weeks  without any major change 01/10/16: wounds without s/s of infection. vascular results are pending regarding arterial studies. 01/17/16; patient comes in today complaining of severe pain however I think most of this is in the right hip not related to her wounds. She continues with a oval-shaped wound on the right lateral leg, trauma to the left anterior leg just below her tibial plateau. She has a smaller eschar on the left anterior leg. She is being using Prisma however she informs [place] today that she is actually allergic to silver, nose this from a previous application at [facility] some years ago 01/24/16; edema is not so well controlled today, I think I reduced her to [place] bilaterally last week. The area which was a scissor injury on her left upper anterior lower leg is fully epithelialized. She only has a small open area remaining on the medial aspect of her left leg. The oval-shaped wound on the right mid lateral leg may be a bit smaller. Debrided of surface slough nonviable subcutaneous tissue. I had changed to collagen 2 weeks ago in an attempt to get this to close 01/31/16 all the patient's wounds on her left leg give healed. We have good edema control with bilateral Profore lites which she has been compliant with. She still has the oval-shaped wound on the right lateral calf allergic even this appears to be slowly improving. The patient has juxtalite stockings at home. She states she thinks she can put these on. She also has external compression pumps at home  although I think her compliance with this has not been good in the past. She complains today of edema in her thighs. Tells me she takes 40 mg of Lasix daily 02/21/16; only 1 small wound remains on the lateral aspect of the right calf. She is using Juzo stockings on the left leg although she complains about difficulty in applying them. She has external compression pumps at home 02/28/16; the small open wound on her right lateral calf is improved in terms of wound area. It appears that she has a wrap injury on the anterior aspect of the upper leg 03/06/16; the small open wound on her right lateral calf has a very small open area remaining. The wrap injury on the anterior aspect of the upper leg also appears better. She arrives today in clinic with a history of dyspnea with minimal exertion starting with the last 2-4 days. She does not describe chest pain. Her son and our intake nurse think she has facial swelling. She has a history of an artificial mitral valve on Coumadin 03/13/16; the small wound on the right lateral calf is no better this week. The superior wrap injury anteriorly requires debridement of surface eschar and nonviable subcutaneous tissue. She has been to her primary doctor with regards to her dyspnea we identified last week. Per the patient's description her Lasix has been increased Electronic Signature(s) Signed: 03/14/2016 7:42:49 AM By: [name] MD Entered By: [name] on 03/13/2016 20:05:37 [name] ([id number]) -------------------------------------------------------------------------------- Physical Exam Details [name] Date of Service: 03/13/2016 3:15 PM Patient Name: C. Patient Account Number: [id number] Medical Record Treating RN: [name] [id number] Number: Other Clinician: 08/22/1934 (81 y.o. Treating Kashden Deboy, [place] Date of Birth/Sex: Female) Physician/Extender: G Primary Care [name] Physician: Referring Physician:  [name] in Treatment: 34 Constitutional Patient is hypertensive.. Pulse regular and within target range for patient.Marland Kitchen Respirations regular, non-labored and within target range.. Temperature is normal and within the target range for the patient.Marland Kitchen  Notes Wound Exam: The open area on her right lower extremity is small but is not close from last week. Debridement done of adherent surface eschar on the superior wounds which were initially wrap injuries. Nonviable tissue also removed Electronic Signature(s) Signed: 03/14/2016 7:42:49 AM By: [name] MD Entered By: [name] on 03/13/2016 20:40:41 [name] ([id number]) -------------------------------------------------------------------------------- Physician Orders Details [name] Date of Service: 03/13/2016 3:15 PM Patient Name: C. Patient Account Number: [id number] Medical Record Treating RN: [name] [id number] Number: Other Clinician: [name] 20, 1935 (81 y.o. Treating Allean Montfort, [place] Date of Birth/Sex: Female) Physician/Extender: G Primary Care [name] Physician: Referring Physician: [name] in Treatment: 16 Verbal / Phone Orders: Yes Clinician: Carolyne Fiscal, Debi Read Back and Verified: Yes Diagnosis Coding Wound Cleansing Wound #2 Right,Lateral Lower Leg o Cleanse wound with mild soap and water o May shower with protection. Wound #4 Right,Proximal Lower Leg o Cleanse wound with mild soap and water o May shower with protection. Wound #5 Right,Distal Lower Leg o Cleanse wound with mild soap and water o May shower with protection. Anesthetic Wound #2 Right,Lateral Lower Leg o Topical Lidocaine 4% cream applied to wound bed prior to debridement - clinic use only Primary Wound Dressing Wound #2 Right,Lateral Lower Leg o Hydrafera Blue Wound #4 Right,Proximal Lower Leg o Hydrafera Blue Wound #5 Right,Distal Lower Leg o Hydrafera Blue Secondary  Dressing Wound #2 Right,Lateral Lower Leg o ABD pad Wound #4 Right,Proximal Lower Leg o ABD pad Guadalupe, Greidy C. ([id number]) Wound #5 Right,Distal Lower Leg o ABD pad Dressing Change Frequency Wound #2 Right,Lateral Lower Leg o Change dressing every week Wound #4 Right,Proximal Lower Leg o Change dressing every week Wound #5 Right,Distal Lower Leg o Change dressing every week Follow-up Appointments Wound #2 Right,Lateral Lower Leg o Return Appointment in 1 week. Wound #4 Right,Proximal Lower Leg o Return Appointment in 1 week. Wound #5 Right,Distal Lower Leg o Return Appointment in 1 week. Edema Control Wound #2 Right,Lateral Lower Leg o 4-Layer Compression System - Right Lower Extremity o Elevate legs to the level of the heart and pump ankles as often as possible Wound #4 Right,Proximal Lower Leg o 4-Layer Compression System - Right Lower Extremity o Elevate legs to the level of the heart and pump ankles as often as possible Wound #5 Right,Distal Lower Leg o 4-Layer Compression System - Right Lower Extremity o Elevate legs to the level of the heart and pump ankles as often as possible Additional Orders / Instructions Wound #2 Right,Lateral Lower Leg o Increase protein intake. Wound #4 Right,Proximal Lower Leg o Increase protein intake. Wound #5 Right,Distal Lower Leg o Increase protein intake. LANNAH, KOIKE ([id number]) Electronic Signature(s) Signed: 03/13/2016 5:16:17 PM By: [name] Signed: 03/14/2016 7:42:49 AM By: [name] MD Entered By: [name] on 03/13/2016 16:30:32 SHADE, RIVENBARK ([id number]) -------------------------------------------------------------------------------- Problem List Details [name] Date of Service: 03/13/2016 3:15 PM Patient Name: C. Patient Account Number: [id number] Medical Record Treating RN: [name] [id number] Number: Other Clinician: Aug 13, 1934  (81 y.o. Treating Leydy Worthey Date of Birth/Sex: Female) Physician/Extender: G Primary Care [name] Physician: Referring Physician: [name] in Treatment: 16 Active Problems ICD-10 Encounter Code Description Active Date Diagnosis I87.333 Chronic venous hypertension (idiopathic) with ulcer and 11/22/2015 Yes inflammation of bilateral lower extremity I89.0 Lymphedema, not elsewhere classified 11/22/2015 Yes R48.54 Chronic systolic (congestive) heart failure 11/22/2015 Yes S81.812A Laceration without foreign body, left lower leg, initial 12/18/2015 Yes encounter Inactive Problems Resolved Problems Electronic Signature(s) Signed: 03/14/2016 7:42:49  AM By: [name] MD Entered By: [name] on 03/13/2016 19:48:54 [place], [name] ([id number]) -------------------------------------------------------------------------------- Progress Note Details [name] Date of Service: 03/13/2016 3:15 PM Patient Name: C. Patient Account Number: [id number] Medical Record Treating RN: [name] [id number] Number: Other Clinician: [birth year] (81 y.o. Treating Joleigh Mineau, [place] Date of Birth/Sex: Female) Physician/Extender: G Primary Care [name] Physician: Referring Physician: [name] in Treatment: 16 Subjective Chief Complaint Information obtained from Patient Patient is here for review of bilateral lower extremity wounds dating back to December 2016 History of Present Illness (HPI) 11/22/15; this is [place] is a 81 year old woman who lives at home on her own. According to the patient and her daughter was present she has had long-standing edema in her legs dating back many years. She also has a history of chronic systolic heart failure, atrial fibrillation and is status post mitral valve replacement. Last [facility]ogram I see [facility] showed an ejection fraction of 40-45% she is on Lasix 60 mg a day and spironolactone 25 mg a  day. Her current problem began in December around Christmas time she developed a small hematoma in the medial part of her left leg which rapidly expanded to a very large hematoma that required surgical debridement. This situation was complicated by the fact that the patient is on long-standing Coumadin for mechanical heart valve. She went to the OR had this evacuated on January 4 /17. The wound has gradually improved however she has developed a small wounds around this area and more recently a wound on the right lateral leg. She is weeping edema fluid. The patient is already been to see vascular surgery. It was recommended that she wear Unna boots, she did not tolerate this due to pain in the left leg. She was then prescribed Juzo stockings and really can't get these on herself although truthfully there is probably too much edema for a Juzo stockings currently. She is not a diabetic and has no history of PAD or claudication that I could elicit. She does not use the external compression pumps reliably. She comes today with notes from her primary physician and Dr. Ronalee Belts both recommending various forms of compression but the patient does not really complied with them. Has been using the external compression pumps with some regularity but certainly not daily on the right leg and this has helped. I also note that her daughter tells me the history that she does not sleep in bed at home. She has a hospital bed but with her legs up she finds this painful so she sleeps in the couch sitting up with her legs dependent. 11/29/15; the patient is arrives today accompanied by her son. He expresses satisfaction that she is maintained the compression all week. 12/06/15; the patient has kept her Profore light wraps on, we have good edema control no major change in the wounds we have been using Aquacel 12/13/15; changed to RTD last week. One of the 3 wounds on her left medial leg is healed she has 2 remaining wounds  here and one on the right lateral leg. 12/18/2015 -- the patient was at Dr. Nino Parsley office today and he was seeing her for an arterial study. While the wrap was being removed she had an inadvertent laceration of the left proximal anterior leg which bled quite profusely and a compression dressing was applied over this and the patient was here to get her Profore wraps done. I was asked to see the patient to make an assessment and treat  appropriately. LAKHIA, GENGLER ([id number]) 12/26/15; the patient's injury on the left proximal leg and Steri-Strips removed after soaking. There is an open area here. The original wounds to still open on the right lateral and left medial leg. 01/03/16 patient's injury on her proximal left leg looks quite good. Still small open area on the medial left leg which appears to be improving. The area on the right lateral leg still substantially open with no real improvement in wound depth. Her edema control is marginal with a Profore light. We have been using RTD for 3-4 weeks without any major change 01/10/16: wounds without s/s of infection. vascular results are pending regarding arterial studies. 01/17/16; patient comes in today complaining of severe pain however I think most of this is in the right hip not related to her wounds. She continues with a oval-shaped wound on the right lateral leg, trauma to the left anterior leg just below her tibial plateau. She has a smaller eschar on the left anterior leg. She is being using Prisma however she informs [place] today that she is actually allergic to silver, nose this from a previous application at [facility] some years ago 01/24/16; edema is not so well controlled today, I think I reduced her to [place] bilaterally last week. The area which was a scissor injury on her left upper anterior lower leg is fully epithelialized. She only has a small open area remaining on the medial aspect of her left leg. The oval-shaped wound on the  right mid lateral leg may be a bit smaller. Debrided of surface slough nonviable subcutaneous tissue. I had changed to collagen 2 weeks ago in an attempt to get this to close 01/31/16 all the patient's wounds on her left leg give healed. We have good edema control with bilateral Profore lites which she has been compliant with. She still has the oval-shaped wound on the right lateral calf allergic even this appears to be slowly improving. The patient has juxtalite stockings at home. She states she thinks she can put these on. She also has external compression pumps at home although I think her compliance with this has not been good in the past. She complains today of edema in her thighs. Tells me she takes 40 mg of Lasix daily 02/21/16; only 1 small wound remains on the lateral aspect of the right calf. She is using Juzo stockings on the left leg although she complains about difficulty in applying them. She has external compression pumps at home 02/28/16; the small open wound on her right lateral calf is improved in terms of wound area. It appears that she has a wrap injury on the anterior aspect of the upper leg 03/06/16; the small open wound on her right lateral calf has a very small open area remaining. The wrap injury on the anterior aspect of the upper leg also appears better. She arrives today in clinic with a history of dyspnea with minimal exertion starting with the last 2-4 days. She does not describe chest pain. Her son and our intake nurse think she has facial swelling. She has a history of an artificial mitral valve on Coumadin 03/13/16; the small wound on the right lateral calf is no better this week. The superior wrap injury anteriorly requires debridement of surface eschar and nonviable subcutaneous tissue. She has been to her primary doctor with regards to her dyspnea we identified last week. Per the patient's description her Lasix has been increased Objective Constitutional Patient  is hypertensive.. Pulse regular  and within target range for patient.Marland Kitchen Respirations regular, non-labored and within target range.. Temperature is normal and within the target range for the patient.. Vitals Time Taken: 3:55 PM, Height: 68 in, Weight: 178 lbs, BMI: 27.1, Pulse: 68 bpm, Respiratory Rate: 20 Matte, Tanice C. ([id number]) breaths/min, Blood Pressure: 155/70 mmHg. General Notes: Wound Exam: The open area on her right lower extremity is small but is not close from last week. Debridement done of adherent surface eschar on the superior wounds which were initially wrap injuries. Nonviable tissue also removed Integumentary (Hair, Skin) Wound #2 status is Open. Original cause of wound was Gradually Appeared. The wound is located on the Right,Lateral Lower Leg. The wound measures 0.1cm length x 0.1cm width x 0.1cm depth; 0.008cm^2 area and 0.001cm^3 volume. The wound is limited to skin breakdown. There is no tunneling or undermining noted. There is a small amount of serous drainage noted. The wound margin is distinct with the outline attached to the wound base. There is large (67-100%) pink granulation within the wound bed. There is no necrotic tissue within the wound bed. The periwound skin appearance exhibited: Localized Edema, Moist. The periwound skin appearance did not exhibit: Callus, Crepitus, Excoriation, Fluctuance, Friable, Induration, Rash, Scarring, Dry/Scaly, Maceration, Atrophie Blanche, Cyanosis, Ecchymosis, Hemosiderin Staining, Mottled, Pallor, Rubor, Erythema. Periwound temperature was noted as No Abnormality. The periwound has tenderness on palpation. Wound #4 status is Open. Original cause of wound was Shear/Friction. The wound is located on the Right,Proximal Lower Leg. The wound measures 0.2cm length x 0.3cm width x 0.1cm depth; 0.047cm^2 area and 0.005cm^3 volume. The wound is limited to skin breakdown. There is no tunneling or undermining noted. There is a large  amount of serosanguineous drainage noted. The wound margin is distinct with the outline attached to the wound base. There is no granulation within the wound bed. There is a large (67- 100%) amount of necrotic tissue within the wound bed including Eschar. The periwound skin appearance exhibited: Moist. Periwound temperature was noted as No Abnormality. The periwound has tenderness on palpation. Wound #5 status is Open. Original cause of wound was Shear/Friction. The wound is located on the Right,Distal Lower Leg. The wound measures 0.5cm length x 1.3cm width x 0.1cm depth; 0.511cm^2 area and 0.051cm^3 volume. The wound is limited to skin breakdown. There is no tunneling or undermining noted. There is a large amount of serosanguineous drainage noted. The wound margin is distinct with the outline attached to the wound base. There is large (67-100%) red granulation within the wound bed. There is no necrotic tissue within the wound bed. The periwound skin appearance exhibited: Moist. Periwound temperature was noted as No Abnormality. The periwound has tenderness on palpation. Assessment Active Problems ICD-10 I87.333 - Chronic venous hypertension (idiopathic) with ulcer and inflammation of bilateral lower extremity I89.0 - Lymphedema, not elsewhere classified N02.72 - Chronic systolic (congestive) heart failure Z36.644I - Laceration without foreign body, left lower leg, initial encounter Orbach, Monta C. ([id number]) Procedures Wound #4 Wound #4 is a Skin Tear located on the Right,Proximal Lower Leg . There was a Skin/Subcutaneous Tissue Debridement ([id number]) debridement with total area of 0.06 sq cm performed by [name], MD. with the following instrument(s): Curette to remove Viable and Non-Viable tissue/material including Exudate, Fibrin/Slough, and Subcutaneous after achieving pain control using Lidocaine 4% Topical Solution. A time out was conducted at 16:25, prior to the  start of the procedure. A Minimum amount of bleeding was controlled with Pressure. The procedure was tolerated well with a  pain level of 0 throughout and a pain level of 0 following the procedure. Post Debridement Measurements: 0.2cm length x 0.2cm width x 0.1cm depth; 0.003cm^3 volume. Character of Wound/Ulcer Post Debridement is stable. Severity of Tissue Post Debridement is: Fat layer exposed. Post procedure Diagnosis Wound #4: Same as Pre-Procedure Wound #5 Wound #5 is a Skin Tear located on the Right,Distal Lower Leg . There was a Skin/Subcutaneous Tissue Debridement ([id number]) debridement with total area of 0.65 sq cm performed by [name], MD. with the following instrument(s): Curette to remove Viable and Non-Viable tissue/material including Exudate, Fibrin/Slough, and Subcutaneous after achieving pain control using Lidocaine 4% Topical Solution. A time out was conducted at 16:25, prior to the start of the procedure. A Minimum amount of bleeding was controlled with Pressure. The procedure was tolerated well with a pain level of 0 throughout and a pain level of 0 following the procedure. Post Debridement Measurements: 0.5cm length x 0.5cm width x 0.1cm depth; 0.02cm^3 volume. Character of Wound/Ulcer Post Debridement is stable. Severity of Tissue Post Debridement is: Fat layer exposed. Post procedure Diagnosis Wound #5: Same as Pre-Procedure Plan Wound Cleansing: Wound #2 Right,Lateral Lower Leg: Cleanse wound with mild soap and water May shower with protection. Wound #4 Right,Proximal Lower Leg: Cleanse wound with mild soap and water May shower with protection. Wound #5 Right,Distal Lower Leg: Brew, Sheriece C. ([id number]) Cleanse wound with mild soap and water May shower with protection. Anesthetic: Wound #2 Right,Lateral Lower Leg: Topical Lidocaine 4% cream applied to wound bed prior to debridement - clinic use only Primary Wound Dressing: Wound #2  Right,Lateral Lower Leg: Hydrafera Blue Wound #4 Right,Proximal Lower Leg: Hydrafera Blue Wound #5 Right,Distal Lower Leg: Hydrafera Blue Secondary Dressing: Wound #2 Right,Lateral Lower Leg: ABD pad Wound #4 Right,Proximal Lower Leg: ABD pad Wound #5 Right,Distal Lower Leg: ABD pad Dressing Change Frequency: Wound #2 Right,Lateral Lower Leg: Change dressing every week Wound #4 Right,Proximal Lower Leg: Change dressing every week Wound #5 Right,Distal Lower Leg: Change dressing every week Follow-up Appointments: Wound #2 Right,Lateral Lower Leg: Return Appointment in 1 week. Wound #4 Right,Proximal Lower Leg: Return Appointment in 1 week. Wound #5 Right,Distal Lower Leg: Return Appointment in 1 week. Edema Control: Wound #2 Right,Lateral Lower Leg: 4-Layer Compression System - Right Lower Extremity Elevate legs to the level of the heart and pump ankles as often as possible Wound #4 Right,Proximal Lower Leg: 4-Layer Compression System - Right Lower Extremity Elevate legs to the level of the heart and pump ankles as often as possible Wound #5 Right,Distal Lower Leg: 4-Layer Compression System - Right Lower Extremity Elevate legs to the level of the heart and pump ankles as often as possible Additional Orders / Instructions: Wound #2 Right,Lateral Lower Leg: Increase protein intake. Wound #4 Right,Proximal Lower Leg: Increase protein intake. Wound #5 Right,Distal Lower Leg: Increase protein intake. ITALI, MCKENDRY ([id number]) changed all dressings to hydrofera blue,profore Electronic Signature(s) Signed: 03/14/2016 7:42:49 AM By: [name] MD Entered By: [name] on 03/13/2016 20:41:43 Viveros, [name] ([id number]) -------------------------------------------------------------------------------- SuperBill Details Patient Name: [name] Date of Service: 03/13/2016 Medical Record Patient Account Number:  [id number] Number: Treating RN: [name] 02/16/1935 (80 y.o. Other Clinician: Date of Birth/Sex: Female) Treating Erice Ahles Primary Care Physician/Extender: Claudette Laws Physician: Suella Grove in Treatment: 16 Referring Physician: [name] Diagnosis Coding ICD-10 Codes Code Description Chronic venous hypertension (idiopathic) with ulcer and inflammation of bilateral lower I87.333 extremity I89.0 Lymphedema, not elsewhere classified S28.31 Chronic systolic (  congestive) heart failure S81.812A Laceration without foreign body, left lower leg, initial encounter Facility Procedures CPT4: Description Modifier Quantity Code 96789381 11042 - DEB SUBQ TISSUE 20 SQ CM/< 1 ICD-10 Description Diagnosis I87.333 Chronic venous hypertension (idiopathic) with ulcer and inflammation of bilateral lower extremity Physician Procedures CPT4: Description Modifier Quantity Code [id number] - WC PHYS SUBQ TISS 20 SQ CM 1 ICD-10 Description Diagnosis I87.333 Chronic venous hypertension (idiopathic) with ulcer and inflammation of bilateral lower extremity Electronic Signature(s) Signed: 03/14/2016 7:42:49 AM By: [name] MD Entered By: [name] on 03/13/2016 20:42:10

## 2016-03-18 ENCOUNTER — Ambulatory Visit
Admission: RE | Admit: 2016-03-18 | Discharge: 2016-03-18 | Disposition: A | Discharge status: Home / Self Care | Payer: Medicare Other | Source: Ambulatory Visit | Attending: Internal Medicine | Admitting: Internal Medicine

## 2016-03-18 DIAGNOSIS — M4806 Spinal stenosis, lumbar region: Secondary | ICD-10-CM | POA: Diagnosis not present

## 2016-03-18 DIAGNOSIS — M4854XA Collapsed vertebra, not elsewhere classified, thoracic region, initial encounter for fracture: Secondary | ICD-10-CM | POA: Insufficient documentation

## 2016-03-18 DIAGNOSIS — I700 Atherosclerosis of aorta: Secondary | ICD-10-CM | POA: Diagnosis not present

## 2016-03-18 DIAGNOSIS — M4856XA Collapsed vertebra, not elsewhere classified, lumbar region, initial encounter for fracture: Secondary | ICD-10-CM | POA: Diagnosis present

## 2016-03-18 DIAGNOSIS — M5136 Other intervertebral disc degeneration, lumbar region: Secondary | ICD-10-CM | POA: Diagnosis not present

## 2016-03-18 DIAGNOSIS — M5134 Other intervertebral disc degeneration, thoracic region: Secondary | ICD-10-CM | POA: Insufficient documentation

## 2016-03-18 DIAGNOSIS — I2510 Atherosclerotic heart disease of native coronary artery without angina pectoris: Secondary | ICD-10-CM | POA: Diagnosis not present

## 2016-03-18 DIAGNOSIS — K8020 Calculus of gallbladder without cholecystitis without obstruction: Secondary | ICD-10-CM | POA: Diagnosis not present

## 2016-03-18 DIAGNOSIS — M8580 Other specified disorders of bone density and structure, unspecified site: Secondary | ICD-10-CM | POA: Diagnosis not present

## 2016-03-18 DIAGNOSIS — S22000A Wedge compression fracture of unspecified thoracic vertebra, initial encounter for closed fracture: Secondary | ICD-10-CM

## 2016-03-18 DIAGNOSIS — S32000A Wedge compression fracture of unspecified lumbar vertebra, initial encounter for closed fracture: Secondary | ICD-10-CM

## 2016-03-20 ENCOUNTER — Other Ambulatory Visit: Payer: Self-pay | Admitting: Orthopedic Surgery

## 2016-03-20 ENCOUNTER — Ambulatory Visit: Payer: Medicare Other | Admitting: Internal Medicine

## 2016-03-20 DIAGNOSIS — S32000A Wedge compression fracture of unspecified lumbar vertebra, initial encounter for closed fracture: Secondary | ICD-10-CM

## 2016-03-21 ENCOUNTER — Encounter: Payer: Self-pay | Admitting: Emergency Medicine

## 2016-03-21 ENCOUNTER — Ambulatory Visit
Admission: RE | Admit: 2016-03-21 | Discharge: 2016-03-21 | Disposition: A | Discharge status: Home / Self Care | Payer: Medicare Other | Source: Ambulatory Visit | Attending: Orthopedic Surgery | Admitting: Orthopedic Surgery

## 2016-03-21 ENCOUNTER — Emergency Department: Payer: Medicare Other

## 2016-03-21 ENCOUNTER — Emergency Department
Admission: EM | Admit: 2016-03-21 | Discharge: 2016-03-21 | Disposition: A | Discharge status: Home / Self Care | Payer: Medicare Other | Attending: Emergency Medicine | Admitting: Emergency Medicine

## 2016-03-21 ENCOUNTER — Encounter: Payer: Medicare Other | Admitting: Surgery

## 2016-03-21 DIAGNOSIS — M4856XA Collapsed vertebra, not elsewhere classified, lumbar region, initial encounter for fracture: Secondary | ICD-10-CM | POA: Insufficient documentation

## 2016-03-21 DIAGNOSIS — X58XXXA Exposure to other specified factors, initial encounter: Secondary | ICD-10-CM | POA: Insufficient documentation

## 2016-03-21 DIAGNOSIS — K8020 Calculus of gallbladder without cholecystitis without obstruction: Secondary | ICD-10-CM | POA: Insufficient documentation

## 2016-03-21 DIAGNOSIS — I110 Hypertensive heart disease with heart failure: Secondary | ICD-10-CM | POA: Insufficient documentation

## 2016-03-21 DIAGNOSIS — M7989 Other specified soft tissue disorders: Secondary | ICD-10-CM | POA: Diagnosis present

## 2016-03-21 DIAGNOSIS — R600 Localized edema: Secondary | ICD-10-CM | POA: Insufficient documentation

## 2016-03-21 DIAGNOSIS — Z87891 Personal history of nicotine dependence: Secondary | ICD-10-CM | POA: Diagnosis not present

## 2016-03-21 DIAGNOSIS — M8448XA Pathological fracture, other site, initial encounter for fracture: Secondary | ICD-10-CM | POA: Insufficient documentation

## 2016-03-21 DIAGNOSIS — S3210XA Unspecified fracture of sacrum, initial encounter for closed fracture: Secondary | ICD-10-CM

## 2016-03-21 DIAGNOSIS — I5043 Acute on chronic combined systolic (congestive) and diastolic (congestive) heart failure: Secondary | ICD-10-CM | POA: Insufficient documentation

## 2016-03-21 DIAGNOSIS — I5042 Chronic combined systolic (congestive) and diastolic (congestive) heart failure: Secondary | ICD-10-CM | POA: Diagnosis not present

## 2016-03-21 DIAGNOSIS — Z7982 Long term (current) use of aspirin: Secondary | ICD-10-CM | POA: Diagnosis not present

## 2016-03-21 DIAGNOSIS — M4854XA Collapsed vertebra, not elsewhere classified, thoracic region, initial encounter for fracture: Secondary | ICD-10-CM | POA: Insufficient documentation

## 2016-03-21 DIAGNOSIS — M5136 Other intervertebral disc degeneration, lumbar region: Secondary | ICD-10-CM | POA: Insufficient documentation

## 2016-03-21 DIAGNOSIS — R609 Edema, unspecified: Secondary | ICD-10-CM

## 2016-03-21 DIAGNOSIS — S32000A Wedge compression fracture of unspecified lumbar vertebra, initial encounter for closed fracture: Secondary | ICD-10-CM

## 2016-03-21 HISTORY — DX: Reserved for concepts with insufficient information to code with codable children: IMO0002

## 2016-03-21 LAB — CBC
HEMATOCRIT: 43.6 % (ref 35.0–47.0)
Hemoglobin: 14.5 g/dL (ref 12.0–16.0)
MCH: 26 pg (ref 26.0–34.0)
MCHC: 33.4 g/dL (ref 32.0–36.0)
MCV: 77.9 fL — ABNORMAL LOW (ref 80.0–100.0)
PLATELETS: 245 10*3/uL (ref 150–440)
RBC: 5.6 MIL/uL — ABNORMAL HIGH (ref 3.80–5.20)
RDW: 25.1 % — AB (ref 11.5–14.5)
WBC: 8.1 10*3/uL (ref 3.6–11.0)

## 2016-03-21 LAB — BASIC METABOLIC PANEL
ANION GAP: 8 (ref 5–15)
BUN: 48 mg/dL — AB (ref 6–20)
CALCIUM: 9.6 mg/dL (ref 8.9–10.3)
CO2: 25 mmol/L (ref 22–32)
Chloride: 102 mmol/L (ref 101–111)
Creatinine, Ser: 1.47 mg/dL — ABNORMAL HIGH (ref 0.44–1.00)
GFR calc Af Amer: 38 mL/min — ABNORMAL LOW (ref >60)
GFR calc non Af Amer: 33 mL/min — ABNORMAL LOW (ref >60)
GLUCOSE: 123 mg/dL — AB (ref 65–99)
Potassium: 4.7 mmol/L (ref 3.5–5.1)
Sodium: 135 mmol/L (ref 135–145)

## 2016-03-21 LAB — PROTIME-INR
INR: 6.86 — AB
PROTHROMBIN TIME: 61.6 s — AB (ref 11.4–15.2)

## 2016-03-21 NOTE — ED Provider Notes (Signed)
[facility] Emergency Department Provider Note  ____________________________________________   I have reviewed the triage vital signs and the nursing notes.   HISTORY  Chief Complaint Leg Swelling    HPI [name] is a 81 y.o. female who presents today complaining of chronic lower extremity swelling. Patient has a history of lower shortly swelling for years. They recently went down on her Lasix. Last week. Patient is on Coumadin for mechanical heart valve revise her. She denies any chest pain or shortness of breath. She denies any pain in her lower extremity. She denies any fever.    Past Medical History:  Diagnosis Date  . Arthritis   . Chronic atrial fibrillation ([place])    a. On Coumadin  . 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 EF 25% by echo in 08/2015 b. RHC in 08/2015 showed normal filling pressures  . Compression fracture   . GERD (gastroesophageal reflux disease)   . Hypertension   . S/P MVR (mitral valve replacement)    a. MVR 1994 b. redo MVR in 08/2014 - on Coumadin  . Shortness of breath dyspnea     Patient Active Problem List   Diagnosis Date Noted  . Chronic systolic heart failure ([place]) 11/19/2015  . Systolic and diastolic CHF, acute on chronic ([place])   . S/P MVR (mitral valve replacement)   . Lymphedema   . Chronic kidney disease (CKD)   . Bilateral leg edema   . Acute CHF (congestive heart failure) ([place]) 09/28/2015  . Bilateral lower extremity edema 08/30/2015  . ARF (acute renal failure) ([place]) 04/28/2015  . Hyponatremia 04/28/2015  . H/O mitral valve replacement with mechanical valve 11/04/2014  . Long term current use of anticoagulant therapy 09/28/2014  . Acute on chronic diastolic heart failure ([place]) 09/19/2014  . 2Nd degree atrioventricular block 09/06/2014  . H/O prosthetic heart valve 09/05/2014  . CCF (congestive cardiac failure) ([place]) 11/28/2013  .  Generalized OA 11/28/2013  . HLD (hyperlipidemia) 11/28/2013  . Atrial fibrillation with rapid ventricular response ([place]) 11/09/2013    Past Surgical History:  Procedure Laterality Date  . BREAST SURGERY Right 2005?   benign lump excision  . CARDIAC CATHETERIZATION    . [place], 2016  . CATARACT EXTRACTION W/PHACO Right 02/20/2015   Procedure: CATARACT EXTRACTION PHACO AND INTRAOCULAR LENS PLACEMENT (IOC);  Surgeon: Estill Cotta, MD;  Location: [facility] ORS;  Service: Ophthalmology;  Laterality: Right;  US01:31.2 AP 25.3% CDE40.13 Fluid lot # [id number]  . ELECTROPHYSIOLOGIC STUDY N/A 10/09/2015   Procedure: CARDIOVERSION;  Surgeon: Wellington Hampshire, MD;  Location: [facility] ORS;  Service: Cardiovascular;  Laterality: N/A;  . IRRIGATION AND DEBRIDEMENT HEMATOMA Left 07/05/2015   Procedure: IRRIGATION AND DEBRIDEMENT HEMATOMA;  Surgeon: Robert Bellow, MD;  Location: [facility] ORS;  Service: General;  Laterality: Left;  . pyloric stenosis    . US ECHOCARDIOGRAPHY      Prior to Admission medications   Medication Sig Start Date End Date Taking? Authorizing Provider  acetaminophen (TYLENOL) 500 MG tablet Take 500 mg by mouth every 6 (six) hours as needed for mild pain, moderate pain, fever or headache.    Historical Provider, MD  amiodarone (PACERONE) 200 MG tablet Take 200 mg by mouth daily.    Historical Provider, MD  aspirin EC 81 MG tablet Take 81 mg by mouth daily.     Historical Provider, MD  furosemide (LASIX) 40 MG tablet Take 60 mg by  mouth daily.    Historical Provider, MD  gabapentin (NEURONTIN) 100 MG capsule Take 1 capsule (100 mg total) by mouth at bedtime. 10/31/15   Robert Bellow, MD  metoprolol succinate (TOPROL-XL) 25 MG 24 hr tablet Take 1 tablet (25 mg total) by mouth daily. 10/10/15   Gladstone Lighter, MD  Multiple Vitamin (MULTI-VITAMINS) TABS Take 1 tablet by mouth daily.     Historical Provider, MD  pantoprazole (PROTONIX) 40 MG tablet Take 40 mg by  mouth daily.     Historical Provider, MD  potassium chloride (K-DUR) 10 MEQ tablet Take by mouth. 10/26/15 10/25/16  Historical Provider, MD  spironolactone (ALDACTONE) 25 MG tablet Take 25 mg by mouth daily.    Historical Provider, MD  traMADol (ULTRAM) 50 MG tablet TAKE 1/2 TO 1 TABLET BY MOUTH TWICE A DAY AS NEEDED FOR PAIN 10/21/15   Historical Provider, MD  vitamin C (ASCORBIC ACID) 500 MG tablet Take 500 mg by mouth daily as needed (for cold symptoms).     Historical Provider, MD  warfarin (COUMADIN) 5 MG tablet Take 1 tablet (5 mg total) by mouth daily. 10/10/15   Gladstone Lighter, MD    Allergies Ace inhibitors; Hydrocodone; Mercury; Nickel; Silver; Cephalexin; and Penicillins  Family History  Problem Relation Age of Onset  . Stroke Mother   . Renal Disease Father     Social History Social History  Substance Use Topics  . Smoking status: Former Research scientist (life sciences)  . Smokeless tobacco: Never Used  . Alcohol use No     Comment: occasional    Review of Systems Constitutional: No fever/chills Eyes: No visual changes. ENT: No sore throat. No stiff neck no neck pain Cardiovascular: Denies chest pain. Respiratory: Denies shortness of breath. Gastrointestinal:   no vomiting.  No diarrhea.  No constipation. Genitourinary: Negative for dysuria. Musculoskeletal: Positive lower extremity swelling Skin: Negative for rash. Neurological: Negative for severe headaches, focal weakness or numbness. 10-point ROS otherwise negative.  ____________________________________________   PHYSICAL EXAM:  VITAL SIGNS: ED Triage Vitals  Enc Vitals Group     BP 03/21/16 1450 (!) 142/65     Pulse Rate 03/21/16 1450 67     Resp 03/21/16 1450 20     Temp 03/21/16 1450 97.4 F (36.3 C)     Temp Source 03/21/16 1450 Oral     SpO2 03/21/16 1450 95 %     Weight 03/21/16 1451 154 lb (69.9 kg)     Height 03/21/16 1451 5\' 7"  (1.702 m)     Head Circumference --      Peak Flow --      Pain Score --      Pain  Loc --      Pain Edu? --      Excl. in [place]? --     Constitutional: Alert and oriented. Well appearing and in no acute distress. Eyes: Conjunctivae are normal. PERRL. EOMI. Head: Atraumatic. Nose: No congestion/rhinnorhea. Mouth/Throat: Mucous membranes are moist.  Oropharynx non-erythematous. Neck: No stridor.   Nontender with no meningismus Cardiovascular: Normal rate, regular rhythm. Grossly normal heart sounds with mechanical click appreciated.  Good peripheral circulation. Respiratory: Normal respiratory effort.  No retractions. Lungs CTAB. Abdominal: Soft and nontender. No distention. No guarding no rebound Back:  There is no focal tenderness or step off.  there is no midline tenderness there are no lesions noted. there is no CVA tenderness Musculoskeletal: No lower extremity tenderness, no upper extremity tenderness. No joint effusions, bilateral swelling is noted left  leg greater than right but the left is not wearing compression stockings compared to the right. There is no calf pain. Is not hot or warm to touch. Pulses noted. Neurologic:  Normal speech and language. No gross focal neurologic deficits are appreciated.  Skin:  Skin is warm, dry and intact. No rash noted. Psychiatric: Mood and affect are normal. Speech and behavior are normal.  ____________________________________________   LABS (all labs ordered are listed, but only abnormal results are displayed)  Labs Reviewed  BASIC METABOLIC PANEL - Abnormal; Notable for the following:       Result Value   Glucose, Bld 123 (*)    BUN 48 (*)    Creatinine, Ser 1.47 (*)    GFR calc non Af Amer 33 (*)    GFR calc Af Amer 38 (*)    All other components within normal limits  CBC - Abnormal; Notable for the following:    RBC 5.60 (*)    MCV 77.9 (*)    RDW 25.1 (*)    All other components within normal limits  PROTIME-INR - Abnormal; Notable for the following:    Prothrombin Time 61.6 (*)    INR 6.86 (*)    All other  components within normal limits   ____________________________________________  EKG  I personally interpreted any EKGs ordered by me or triage  ____________________________________________  RADIOLOGY  I reviewed any imaging ordered by me or triage that were performed during my shift and, if possible, patient and/or family made aware of any abnormal findings. ____________________________________________   PROCEDURES  Procedure(s) performed: None  Procedures  Critical Care performed: None  ____________________________________________   INITIAL IMPRESSION / ASSESSMENT AND PLAN / ED COURSE  Pertinent labs & imaging results that were available during my care of the patient were reviewed by me and considered in my medical decision making (see chart for details).  Patient presents today with lower showed a swelling after decreasing her Lasix. We will have her go back up on her Lasix and I think should help no evidence of infection and DVT studies are negative. No evidence of CHF in terms of pulmonary symptoms. She has no chest pain or shortness of breath. We will advise that she hold her Coumadin for the next 2 days and see her doctor first thing on Monday as her Coumadin levels or elevated. However, there is no evidence of bleeding. Extensive return for crossing given for chest pain shortness of breath fever, bleeding or any other new or worrisome symptoms.  Clinical Course   ____________________________________________   FINAL CLINICAL IMPRESSION(S) / ED DIAGNOSES  Final diagnoses:  None      This chart was dictated using voice recognition software.  Despite best efforts to proofread,  errors can occur which can change meaning.      Schuyler Amor, MD 03/21/16 [phone]

## 2016-03-21 NOTE — ED Triage Notes (Signed)
Pt to ED today with swelling on Left lower extremity, she believes is related to her lasix being cut in half recently.  Pt attends wound clinic and her MD there requested her to be checked for DVT and infection before he would wrap it.

## 2016-03-21 NOTE — Discharge Instructions (Signed)
Your Coumadin level is 6.8. This is elevated. To your best to avoid falling or hitting her head. If you have any bleeding from her bottom or anywhere else return to the emergency department. Do not take Coumadin tonight or tomorrow. Follow closely with her primary care doctor for repeat of her Coumadin level. He also noticed that since he went down on her Lasix, yet had increased swelling in her legs. At this time, your electrolytes are reassuring. We ask that you continue taking potassium pills at home and increase your Lasix dosage back to what she was on before for the next 3 days to see if that helps. See your doctor without fail on Monday. If you have any chest pain or shortness of breath or other new or worrisome symptoms return to the emergency room

## 2016-03-21 NOTE — ED Notes (Signed)
Left leg swollen, +3 edema. +1 pulse

## 2016-03-22 NOTE — Progress Notes (Signed)
[name], [name] ([id number]) Visit Report for 03/21/2016 Arrival Information Details Patient Name: [name], [name]. Date of Service: 03/21/2016 1:30 PM Medical Record Number: [id number] Patient Account Number: [id number] Date of Birth/Sex: 09-30-1934 (80 y.o. Female) Treating RN: Cornell Barman Primary Care Physician: [name] Other Clinician: Referring Physician: [name] Treating Physician/Extender: Frann Rider in Treatment: 69 Visit Information History Since Last Visit Added or deleted any medications: Yes Patient Arrived: Wheel Chair Any new allergies or adverse reactions: No Arrival Time: 13:36 Had a fall or experienced change in No Accompanied By: son activities of daily living that may affect Transfer Assistance: Manual risk of falls: Patient Identification Verified: Yes Signs or symptoms of abuse/neglect since last No Secondary Verification Process Yes visito Completed: Hospitalized since last visit: No Patient Requires Transmission- No Has Dressing in Place as Prescribed: Yes Based Precautions: Has Compression in Place as Prescribed: Yes Patient Has Alerts: Yes Pain Present Now: No Patient Alerts: Patient on Blood Thinner [place] Signature(s) Signed: 03/22/2016 10:50:50 AM By: Gretta Cool, RN, BSN, Kim RN, BSN Entered By: Gretta Cool, RN, BSN, Kim on 03/21/2016 13:37:15 Winningham, [name] ([id number]) -------------------------------------------------------------------------------- Encounter Discharge Information Details Patient Name: Carrie Mulligan C. Date of Service: 03/21/2016 1:30 PM Medical Record Number: [id number] Patient Account Number: [id number] Date of Birth/Sex: 1934/09/23 (80 y.o. Female) Treating RN: Cornell Barman Primary Care Physician: [name] Other Clinician: Referring Physician: [name] Treating Physician/Extender: Frann Rider in Treatment: 17 Encounter Discharge Information  Items Discharge Pain Level: 0 Discharge Condition: Stable Ambulatory Status: Ambulatory Discharge Destination: Home Transportation: Private Auto Accompanied By: self Schedule Follow-up Appointment: No Medication Reconciliation completed and provided to Patient/Care No Lyndel Dancel: Provided on Clinical Summary of Care: 03/21/2016 Form Type Recipient Paper Patient [place] Electronic Signature(s) Signed: 03/22/2016 10:50:50 AM By: Gretta Cool RN, BSN, Kim RN, BSN Previous Signature: 03/21/2016 2:18:54 PM Version By: Ruthine Dose Entered By: Gretta Cool RN, BSN, Kim on 03/21/2016 14:22:18 Essick, [name] ([id number]) -------------------------------------------------------------------------------- Lower Extremity Assessment Details Patient Name: Carrie Mulligan C. Date of Service: 03/21/2016 1:30 PM Medical Record Number: [id number] Patient Account Number: [id number] Date of Birth/Sex: 15-Aug-1934 (80 y.o. Female) Treating RN: Cornell Barman Primary Care Physician: [name] Other Clinician: Referring Physician: [name] Treating Physician/Extender: Frann Rider in Treatment: 17 Edema Assessment Assessed: Shirlyn Goltz: No] [Right: No] E[Left: dema] [Right: :] Calf Left: Right: Point of Measurement: 36 cm From Medial Instep 35.6 cm 31.5 cm Ankle Left: Right: Point of Measurement: 10 cm From Medial Instep 27 cm 20 cm Vascular Assessment Pulses: Posterior Tibial Dorsalis Pedis Palpable: [Left:No] [Right:Yes] Doppler: [Left:Multiphasic] Extremity colors, hair growth, and conditions: Extremity Color: [Left:Red] [Right:Normal] Hair Growth on Extremity: [Left:No] [Right:No] Temperature of Extremity: [Left:Cool] [Right:Cool] Capillary Refill: [Left:< 3 seconds] [Right:< 3 seconds] Toe Nail Assessment Left: Right: Thick: No No Discolored: No No Deformed: No No Improper Length and Hygiene: No No Electronic Signature(s) Signed: 03/22/2016 10:50:50 AM By: Gretta Cool, RN, BSN, Kim RN,  BSN Entered By: Gretta Cool, RN, BSN, Kim on 03/21/2016 13:49:55 Virella, [name] ([id number]) Heidemann, Brittani Loletha Grayer ([id number]) -------------------------------------------------------------------------------- Multi Wound Chart Details Patient Name: Carrie Mulligan C. Date of Service: 03/21/2016 1:30 PM Medical Record Number: [id number] Patient Account Number: [id number] Date of Birth/Sex: 1935/02/21 (80 y.o. Female) Treating RN: Cornell Barman Primary Care Physician: [name] Other Clinician: Referring Physician: [name] Treating Physician/Extender: Frann Rider in Treatment: 17 Vital Signs Height(in): 68 Pulse(bpm): 68 Weight(lbs): 178 Blood Pressure 140/60 (mmHg): Body Mass Index(BMI): 27 Temperature(F): 98.1 Respiratory Rate 18 (breaths/min): Photos: [2:No  Photos] [4:No Photos] [5:No Photos] Wound Location: [2:Right Lower Leg - Lateral] [4:Right, Proximal Lower Leg Right, Distal Lower Leg] Wounding Event: [2:Gradually Appeared] [4:Shear/Friction] [5:Shear/Friction] Primary Etiology: [2:Lymphedema] [4:Skin Tear] [5:Skin Tear] Comorbid History: [2:Cataracts, Arrhythmia, Hypotension, Osteoarthritis] [4:N/A] [5:N/A] Date Acquired: [2:07/04/2015] [4:02/28/2016] [5:02/28/2016] Weeks of Treatment: [2:17] [4:3] [5:3] Wound Status: [2:Healed - Epithelialized] [4:Open] [5:Open] Measurements L x W x D 0x0x0 [4:0.2x0.3x0.1] [5:0.6x0.6x0.1] (cm) Area (cm) : [2:0] [4:0.047] [5:0.283] Volume (cm) : [2:0] [4:0.005] [5:0.028] % Reduction in Area: [2:100.00%] [4:33.80%] [5:70.00%] % Reduction in Volume: 100.00% [4:28.60%] [5:70.20%] Classification: [2:Partial Thickness] [4:Partial Thickness] [5:Partial Thickness] Exudate Amount: [2:Small] [4:N/A] [5:N/A] Exudate Type: [2:Serous] [4:N/A] [5:N/A] Exudate Color: [2:amber] [4:N/A] [5:N/A] Wound Margin: [2:Distinct, outline attached] [4:N/A] [5:N/A] Granulation Amount: [2:Large (67-100%)] [4:N/A] [5:N/A] Granulation Quality:  [2:Pink] [4:N/A] [5:N/A] Necrotic Amount: [2:None Present (0%)] [4:N/A] [5:N/A] Exposed Structures: [2:Fascia: No Fat: No Tendon: No Muscle: No Joint: No Bone: No] [4:N/A] [5:N/A] Limited to Skin Breakdown Epithelialization: Large (67-100%) N/A N/A Periwound Skin Texture: Edema: Yes No Abnormalities Noted No Abnormalities Noted Excoriation: No Induration: No Callus: No Crepitus: No Fluctuance: No Friable: No Rash: No Scarring: No Periwound Skin Moist: Yes No Abnormalities Noted No Abnormalities Noted Moisture: Maceration: No Dry/Scaly: No Periwound Skin Color: Atrophie Blanche: No No Abnormalities Noted No Abnormalities Noted Cyanosis: No Ecchymosis: No Erythema: No Hemosiderin Staining: No Mottled: No Pallor: No Rubor: No Temperature: No Abnormality N/A N/A Tenderness on Yes No No Palpation: Wound Preparation: Ulcer Cleansing: Other: N/A N/A soap and water Topical Anesthetic Applied: None Treatment Notes Electronic Signature(s) Signed: 03/22/2016 10:50:50 AM By: Gretta Cool, RN, BSN, Kim RN, BSN Entered By: Gretta Cool, RN, BSN, Kim on 03/21/2016 13:53:35 Cayer, [name] ([id number]) -------------------------------------------------------------------------------- [place] Details Patient Name: [name]. Date of Service: 03/21/2016 1:30 PM Medical Record Number: [id number] Patient Account Number: [id number] Date of Birth/Sex: 1935/05/20 (80 y.o. Female) Treating RN: Cornell Barman Primary Care Physician: [name] Other Clinician: Referring Physician: [name] Treating Physician/Extender: Frann Rider in Treatment: 53 Active Inactive Orientation to the Wound Care Program Nursing Diagnoses: Knowledge deficit related to the wound healing center program Goals: Patient/caregiver will verbalize understanding of the [facility] Program Date Initiated: 11/22/2015 Goal Status: Active Interventions: Provide education on  orientation to the wound center Notes: Venous Leg Ulcer Nursing Diagnoses: Knowledge deficit related to disease process and management Potential for venous Insuffiency (use before diagnosis confirmed) Goals: Patient will maintain optimal edema control Date Initiated: 11/22/2015 Goal Status: Active Patient/caregiver will verbalize understanding of disease process and disease management Date Initiated: 11/22/2015 Goal Status: Active Verify adequate tissue perfusion prior to therapeutic compression application Date Initiated: 11/22/2015 Goal Status: Active Interventions: Assess peripheral edema status every visit. Compression as ordered Provide education on venous insufficiency GERICA, KOBLE ([id number]) Treatment Activities: Non-invasive vascular studies : 11/22/2015 Therapeutic compression applied : 11/22/2015 Notes: Wound/Skin Impairment Nursing Diagnoses: Impaired tissue integrity Knowledge deficit related to ulceration/compromised skin integrity Goals: Patient/caregiver will verbalize understanding of skin care regimen Date Initiated: 11/22/2015 Goal Status: Active Ulcer/skin breakdown will have a volume reduction of 30% by week 4 Date Initiated: 11/22/2015 Goal Status: Active Ulcer/skin breakdown will have a volume reduction of 50% by week 8 Date Initiated: 11/22/2015 Goal Status: Active Ulcer/skin breakdown will have a volume reduction of 80% by week 12 Date Initiated: 11/22/2015 Goal Status: Active Ulcer/skin breakdown will heal within 14 weeks Date Initiated: 11/22/2015 Goal Status: Active Interventions: Assess patient/caregiver ability to perform ulcer/skin care regimen upon admission and as needed Assess ulceration(s)  every visit Provide education on ulcer and skin care Treatment Activities: Skin care regimen initiated : 11/22/2015 Topical wound management initiated : 11/22/2015 Notes: Electronic Signature(s) Signed: 03/22/2016 10:50:50 AM By: Gretta Cool, RN, BSN,  Kim RN, BSN Entered By: Gretta Cool, RN, BSN, Kim on 03/21/2016 13:53:24 Jenkinson, [name] ([id number]) Butler, [name] ([id number]) -------------------------------------------------------------------------------- Pain Assessment Details Patient Name: [name], [name] Date of Service: 03/21/2016 1:30 PM Medical Record Number: [id number] Patient Account Number: [id number] Date of Birth/Sex: 29-Mar-1935 (80 y.o. Female) Treating RN: Cornell Barman Primary Care Physician: [name] Other Clinician: Referring Physician: [name] Treating Physician/Extender: Frann Rider in Treatment: 17 Active Problems Location of Pain Severity and Description of Pain Patient Has Paino Yes Site Locations Pain Location: Generalized Pain With Dressing Change: Yes Duration of the Pain. Constant / Intermittento Constant Pain Management and Medication Current Pain Management: Goals for Pain Management Topical or injectable lidocaine is offered to patient for acute pain when surgical debridement is performed. If needed, Patient is instructed to use over the counter pain medication for the following 24-48 hours after debridement. Wound care MDs do not prescribed pain medications. Patient has chronic pain or uncontrolled pain. Patient has been instructed to make an appointment with their Primary Care Physician for pain management. Notes patient has compression fractures in her back Electronic Signature(s) Signed: 03/22/2016 10:50:50 AM By: Gretta Cool, RN, BSN, Kim RN, BSN Entered By: Gretta Cool, RN, BSN, Kim on 03/21/2016 13:37:50 Thornton, [name] ([id number]) -------------------------------------------------------------------------------- Patient/Caregiver Education Details Patient Name: [name] Date of Service: 03/21/2016 1:30 PM Medical Record Number: [id number] Patient Account Number: [id number] Date of Birth/Gender: [birth year] (81 y.o. Female) Treating RN: Cornell Barman Primary Care  Physician: [name] Other Clinician: Referring Physician: [name] Treating Physician/Extender: Frann Rider in Treatment: 51 Education Assessment Education Provided To: Patient Education Topics Provided Wound/Skin Impairment: Handouts: Caring for Your Ulcer Methods: Demonstration Responses: State content correctly [employer]) Signed: 03/22/2016 10:50:50 AM By: Gretta Cool, RN, BSN, Kim RN, BSN Entered By: Gretta Cool, RN, BSN, Kim on 03/21/2016 14:22:28 [place]nevert, [name] ([id number]) -------------------------------------------------------------------------------- Wound Assessment Details Patient Name: [name], [name] Date of Service: 03/21/2016 1:30 PM Medical Record Number: [id number] Patient Account Number: [id number] Date of Birth/Sex: 08-23-1934 (80 y.o. Female) Treating RN: Cornell Barman Primary Care Physician: [name] Other Clinician: Referring Physician: [name] Treating Physician/Extender: Frann Rider in Treatment: 17 Wound Status Wound Number: 2 Primary Lymphedema Etiology: Wound Location: Right Lower Leg - Lateral Wound Status: Healed - Epithelialized Wounding Event: Gradually Appeared Comorbid Cataracts, Arrhythmia, Hypotension, Date Acquired: 07/04/2015 History: Osteoarthritis Weeks Of Treatment: 17 Clustered Wound: No Photos Photo Uploaded By: Gretta Cool, RN, BSN, Kim on 03/21/2016 14:44:17 Wound Measurements Length: (cm) Width: (cm) Depth: (cm) Area: (cm) Volume: (cm) 0 % Reduction in Area: 100% 0 % Reduction in Volume: 100% 0 Epithelialization: Large (67-100%) 0 0 Wound Description Classification: Partial Thickness Wound Margin: Distinct, outline attached Exudate Amount: Small Exudate Type: Serous Exudate Color: amber Foul Odor After Cleansing: No Wound Bed Granulation Amount: Large (67-100%) Exposed Structure Granulation Quality: Pink Fascia Exposed: No Necrotic Amount: None Present (0%) Fat Layer  Exposed: No Tendon Exposed: No Muscle Exposed: No Joint Exposed: No Bone Exposed: No Osinski, [name] ([id number]) Limited to Skin Breakdown Periwound Skin Texture Texture Color No Abnormalities Noted: No No Abnormalities Noted: No Callus: No Atrophie Blanche: No Crepitus: No Cyanosis: No Excoriation: No Ecchymosis: No Fluctuance: No Erythema: No Friable: No Hemosiderin Staining: No Induration: No Mottled: No Localized Edema: Yes Pallor: No Rash:  No Rubor: No Scarring: No Temperature / Pain Moisture Temperature: No Abnormality No Abnormalities Noted: No Tenderness on Palpation: Yes Dry / Scaly: No Maceration: No Moist: Yes Wound Preparation Ulcer Cleansing: Other: soap and water, Topical Anesthetic Applied: None Electronic Signature(s) Signed: 03/22/2016 10:50:50 AM By: Gretta Cool, RN, BSN, Kim RN, BSN Entered By: Gretta Cool, RN, BSN, Kim on 03/21/2016 13:52:44 Masoner, [name] ([id number]) -------------------------------------------------------------------------------- Wound Assessment Details Patient Name: Carrie Mulligan C. Date of Service: 03/21/2016 1:30 PM Medical Record Number: [id number] Patient Account Number: [id number] Date of Birth/Sex: 24-Nov-1934 (80 y.o. Female) Treating RN: Cornell Barman Primary Care Physician: [name] Other Clinician: Referring Physician: [name] Treating Physician/Extender: Frann Rider in Treatment: 17 Wound Status Wound Number: 4 Primary Etiology: Skin Tear Wound Location: Right, Proximal Lower Leg Wound Status: Open Wounding Event: Shear/Friction Date Acquired: 02/28/2016 Weeks Of Treatment: 3 Clustered Wound: No Photos Photo Uploaded By: Gretta Cool, RN, BSN, Kim on 03/21/2016 14:44:33 Wound Measurements Length: (cm) 0.2 Width: (cm) 0.3 Depth: (cm) 0.1 Area: (cm) 0.047 Volume: (cm) 0.005 % Reduction in Area: 33.8% % Reduction in Volume: 28.6% Wound Description Classification: Partial  Thickness Periwound Skin Texture Texture Color No Abnormalities Noted: No No Abnormalities Noted: No Moisture No Abnormalities Noted: No Treatment Notes Wound #4 (Right, Proximal Lower Leg) 1. Cleansed with: Clean wound with Normal Saline 2. Anesthetic Goelz, [name] ([id number]) Topical Lidocaine 4% cream to wound bed prior to debridement 4. Dressing Applied: Hydrafera Blue 5. Secondary Dressing Applied ABD Pad 7. Secured with 4-Layer Compression System - Right Lower Extremity Electronic Signature(s) Signed: 03/22/2016 10:50:50 AM By: Gretta Cool, RN, BSN, Kim RN, BSN Entered By: Gretta Cool, RN, BSN, Kim on 03/21/2016 13:52:32 Petitjean, [name] ([id number]) -------------------------------------------------------------------------------- Wound Assessment Details Patient Name: RAMONITA, KOENIG C. Date of Service: 03/21/2016 1:30 PM Medical Record Number: [id number] Patient Account Number: [id number] Date of Birth/Sex: 01-29-1935 (80 y.o. Female) Treating RN: Cornell Barman Primary Care Physician: [name] Other Clinician: Referring Physician: [name] Treating Physician/Extender: Frann Rider in Treatment: 17 Wound Status Wound Number: 5 Primary Etiology: Skin Tear Wound Location: Right, Distal Lower Leg Wound Status: Open Wounding Event: Shear/Friction Date Acquired: 02/28/2016 Weeks Of Treatment: 3 Clustered Wound: No Photos Photo Uploaded By: Gretta Cool, RN, BSN, Kim on 03/21/2016 14:44:53 Wound Measurements Length: (cm) 0.6 Width: (cm) 0.6 Depth: (cm) 0.1 Area: (cm) 0.283 Volume: (cm) 0.028 % Reduction in Area: 70% % Reduction in Volume: 70.2% Wound Description Classification: Partial Thickness Periwound Skin Texture Texture Color No Abnormalities Noted: No No Abnormalities Noted: No Moisture No Abnormalities Noted: No Treatment Notes Wound #5 (Right, Distal Lower Leg) 1. Cleansed with: Clean wound with Normal Saline 2. Anesthetic Nickell,  [name] ([id number]) Topical Lidocaine 4% cream to wound bed prior to debridement 4. Dressing Applied: Hydrafera Blue 5. Secondary Dressing Applied ABD Pad 7. Secured with 4-Layer Compression System - Right Lower Extremity Electronic Signature(s) Signed: 03/22/2016 10:50:50 AM By: Gretta Cool, RN, BSN, Kim RN, BSN Entered By: Gretta Cool, RN, BSN, Kim on 03/21/2016 13:52:32 Billiter, [name] ([id number]) -------------------------------------------------------------------------------- [place] Details Patient Name: [name]. Date of Service: 03/21/2016 1:30 PM Medical Record Number: [id number] Patient Account Number: [id number] Date of Birth/Sex: 1934-11-11 (80 y.o. Female) Treating RN: Cornell Barman Primary Care Physician: [name] Other Clinician: Referring Physician: [name] Treating Physician/Extender: Frann Rider in Treatment: 17 Vital Signs Time Taken: 13:37 Temperature (F): 97.4 Height (in): 68 Pulse (bpm): 68 Weight (lbs): 178 Respiratory Rate (breaths/min): 18 Body Mass Index (BMI): 27.1 Blood Pressure (mmHg): 140/60 Reference Range:  80 - 120 mg / dl Electronic Signature(s) Signed: 03/22/2016 10:50:50 AM By: Gretta Cool, RN, BSN, Kim RN, BSN Entered By: Gretta Cool, RN, BSN, Kim on 03/21/2016 13:58:41

## 2016-03-22 NOTE — Progress Notes (Signed)
ERINNE, GILLENTINE ([id number]) Visit Report for 03/21/2016 Chief Complaint Document Details Patient Name: [name], [name] 03/21/2016 1:30 Date of Service: PM Medical Record [id number] Number: Patient Account Number: [id number] 1934/12/02 (81 y.o. Treating RN: Cornell Barman Date of Birth/Sex: Female) Other Clinician: Primary Care Physician: [name] Treating Christin Fudge Referring Physician: [name] Physician/Extender: Suella Grove in Treatment: 17 Information Obtained from: Patient Chief Complaint Patient is here for review of bilateral lower extremity wounds dating back to December 2016 Electronic Signature(s) Signed: 03/21/2016 2:07:07 PM By: Christin Fudge MD, FACS Entered By: Christin Fudge on 03/21/2016 14:07:07 [name] ([id number]) -------------------------------------------------------------------------------- HPI Details Patient Name: [name], [name] 03/21/2016 1:30 Date of Service: PM Medical Record [id number] Number: Patient Account Number: [id number] 08-08-1934 (81 y.o. Treating RN: Cornell Barman Date of Birth/Sex: Female) Other Clinician: Primary Care Physician: [name] Treating Christin Fudge Referring Physician: [name] Physician/Extender: Suella Grove in Treatment: 17 History of Present Illness HPI Description: 11/22/15; this is Iafrate is a 81 year old woman who lives at home on her own. According to the patient and her daughter was present she has had long-standing edema in her legs dating back many years. She also has a history of chronic systolic heart failure, atrial fibrillation and is status post mitral valve replacement. Last echocardiogram I see in [facility] showed an ejection fraction of 40-45% she is on Lasix 60 mg a day and spironolactone 25 mg a day. Her current problem began in December around Christmas time she developed a small hematoma in the medial part of her left leg which rapidly expanded to a very large hematoma  that required surgical debridement. This situation was complicated by the fact that the patient is on long-standing Coumadin for mechanical heart valve. She went to the OR had this evacuated on January 4 /17. The wound has gradually improved however she has developed a small wounds around this area and more recently a wound on the right lateral leg. She is weeping edema fluid. The patient is already been to see vascular surgery. It was recommended that she wear Unna boots, she did not tolerate this due to pain in the left leg. She was then prescribed Juzo stockings and really can't get these on herself although truthfully there is probably too much edema for a Juzo stockings currently. She is not a diabetic and has no history of PAD or claudication that I could elicit. She does not use the external compression pumps reliably. She comes today with notes from her primary physician and Dr. Ronalee Belts both recommending various forms of compression but the patient does not really complied with them. Has been using the external compression pumps with some regularity but certainly not daily on the right leg and this has helped. I also note that her daughter tells me the history that she does not sleep in bed at home. She has a hospital bed but with her legs up she finds this painful so she sleeps in the couch sitting up with her legs dependent. 11/29/15; the patient is arrives today accompanied by her son. He expresses satisfaction that she is maintained the compression all week. 12/06/15; the patient has kept her Profore light wraps on, we have good edema control no major change in the wounds we have been using Aquacel 12/13/15; changed to RTD last week. One of the 3 wounds on her left medial leg is healed she has 2 remaining wounds here and one on the right lateral leg. 12/18/2015 -- the patient was at Dr. Nino Parsley  office today and he was seeing her for an arterial study. While the wrap was being removed  she had an inadvertent laceration of the left proximal anterior leg which bled quite profusely and a compression dressing was applied over this and the patient was here to get her Profore wraps done. I was asked to see the patient to make an assessment and treat appropriately. 12/26/15; the patient's injury on the left proximal leg and Steri-Strips removed after soaking. There is an open area here. The original wounds to still open on the right lateral and left medial leg. 01/03/16 patient's injury on her proximal left leg looks quite good. Still small open area on the medial left leg which appears to be improving. The area on the right lateral leg still substantially open with no real improvement in wound depth. Her edema control is marginal with a Profore light. We have been using RTD for 3-4 weeks without any major change 01/10/16: wounds without s/s of infection. vascular results are pending regarding arterial studies. JAYMARIE, YEAKEL ([id number]) 01/17/16; patient comes in today complaining of severe pain however I think most of this is in the right hip not related to her wounds. She continues with a oval-shaped wound on the right lateral leg, trauma to the left anterior leg just below her tibial plateau. She has a smaller eschar on the left anterior leg. She is being using Prisma however she informs [place] today that she is actually allergic to silver, nose this from a previous application at [facility] some years ago 01/24/16; edema is not so well controlled today, I think I reduced her to [place] bilaterally last week. The area which was a scissor injury on her left upper anterior lower leg is fully epithelialized. She only has a small open area remaining on the medial aspect of her left leg. The oval-shaped wound on the right mid lateral leg may be a bit smaller. Debrided of surface slough nonviable subcutaneous tissue. I had changed to collagen 2 weeks ago in an attempt to get this to  close 01/31/16 all the patient's wounds on her left leg give healed. We have good edema control with bilateral Profore lites which she has been compliant with. She still has the oval-shaped wound on the right lateral calf allergic even this appears to be slowly improving. The patient has juxtalite stockings at home. She states she thinks she can put these on. She also has external compression pumps at home although I think her compliance with this has not been good in the past. She complains today of edema in her thighs. Tells me she takes 40 mg of Lasix daily 02/21/16; only 1 small wound remains on the lateral aspect of the right calf. She is using Juzo stockings on the left leg although she complains about difficulty in applying them. She has external compression pumps at home 02/28/16; the small open wound on her right lateral calf is improved in terms of wound area. It appears that she has a wrap injury on the anterior aspect of the upper leg 03/06/16; the small open wound on her right lateral calf has a very small open area remaining. The wrap injury on the anterior aspect of the upper leg also appears better. She arrives today in clinic with a history of dyspnea with minimal exertion starting with the last 2-4 days. She does not describe chest pain. Her son and our intake nurse think she has facial swelling. She has a history of an artificial  mitral valve on Coumadin 03/13/16; the small wound on the right lateral calf is no better this week. The superior wrap injury anteriorly requires debridement of surface eschar and nonviable subcutaneous tissue. She has been to her primary doctor with regards to her dyspnea we identified last week. Per the patient's description her Lasix has been increased 03/21/2016 -- patient of Dr. Dellia Nims who could not keep her appointment yesterday but has come in today with the right lower leg looking good and this is the leg which has been treated in the recent past.  However, her left lower extremity is extremely swollen, tender and edematous with redness and discoloration. Electronic Signature(s) Signed: 03/21/2016 2:09:03 PM By: Christin Fudge MD, FACS Entered By: Christin Fudge on 03/21/2016 14:09:03 [name] ([id number]) -------------------------------------------------------------------------------- Physical Exam Details Patient Name: [name], [name] 03/21/2016 1:30 Date of Service: PM Medical Record [id number] Number: Patient Account Number: [id number] 09/20/1934 (81 y.o. Treating RN: Cornell Barman Date of Birth/Sex: Female) Other Clinician: Primary Care Physician: [name] Treating Christin Fudge Referring Physician: [name] Physician/Extender: Suella Grove in Treatment: 17 Constitutional . Pulse regular. Respirations normal and unlabored. Afebrile. . Eyes Nonicteric. Reactive to light. Ears, Nose, Mouth, and Throat Lips, teeth, and gums WNL.Marland Kitchen Moist mucosa without lesions. Neck supple and nontender. No palpable supraclavicular or cervical adenopathy. Normal sized without goiter. Respiratory WNL. No retractions.. Breath sounds WNL, No rubs, rales, rhonchi, or wheeze.. Cardiovascular Heart rhythm and rate regular, no murmur or gallop.. Pedal Pulses WNL. No clubbing, cyanosis or edema. Chest Breasts symmetical and no nipple discharge.. Breast tissue WNL, no masses, lumps, or tenderness.. Lymphatic No adneopathy. No adenopathy. No adenopathy. Musculoskeletal Adexa without tenderness or enlargement.. Digits and nails w/o clubbing, cyanosis, infection, petechiae, ischemia, or inflammatory conditions.. Integumentary (Hair, Skin) No suspicious lesions. No crepitus or fluctuance. No peri-wound warmth or erythema. No masses.Marland Kitchen Psychiatric Judgement and insight Intact.. No evidence of depression, anxiety, or agitation.. Notes the right lower extremity lymphedema is well controlled and the wound is looking clean and there is  not much change. Sharp debridement was required today. However, her left lower extremity is extremely swollen, tender and edematous with redness and discoloration. Pulses are palpable and there is tenderness on eliciting Homans sign. Electronic Signature(s) Signed: 03/21/2016 2:12:31 PM By: Christin Fudge MD, FACS Entered By: Christin Fudge on 03/21/2016 14:12:31 AILA, TERRA ([id number]) MARCELINA, MCLAURIN ([id number]) -------------------------------------------------------------------------------- Physician Orders Details Patient Name: KIVA, NORLAND 03/21/2016 1:30 Date of Service: PM Medical Record [id number] Number: Patient Account Number: [id number] Dec 13, 1934 (81 y.o. Treating RN: Cornell Barman Date of Birth/Sex: Female) Other Clinician: Primary Care Physician: [name] Treating Christin Fudge Referring Physician: [name] Physician/Extender: Suella Grove in Treatment: 17 Verbal / Phone Orders: Yes Clinician: Cornell Barman Read Back and Verified: Yes Diagnosis Coding Wound Cleansing Wound #4 Right,Proximal Lower Leg o Cleanse wound with mild soap and water o May shower with protection. Wound #5 Right,Distal Lower Leg o Cleanse wound with mild soap and water o May shower with protection. Primary Wound Dressing Wound #4 Right,Proximal Lower Leg o Hydrafera Blue Wound #5 Right,Distal Lower Leg o Hydrafera Blue Secondary Dressing Wound #4 Right,Proximal Lower Leg o ABD pad Wound #5 Right,Distal Lower Leg o ABD pad Dressing Change Frequency Wound #4 Right,Proximal Lower Leg o Change dressing every week Wound #5 Right,Distal Lower Leg o Change dressing every week Follow-up Appointments Wound #4 Right,Proximal Lower Leg o Return Appointment in 1 week. LORALEE, WEITZMAN ([id number]) Wound #5 Right,Distal Lower Leg o Return Appointment in  1 week. Edema Control Wound #4 Right,Proximal Lower Leg o 4-Layer Compression System - Right  Lower Extremity o Elevate legs to the level of the heart and pump ankles as often as possible Wound #5 Right,Distal Lower Leg o 4-Layer Compression System - Right Lower Extremity o Elevate legs to the level of the heart and pump ankles as often as possible Additional Orders / Instructions Wound #4 Right,Proximal Lower Leg o Increase protein intake. Wound #5 Right,Distal Lower Leg o Increase protein intake. Services and Therapies o Arterial Studies- Unilateral - Patient instructed to go directly to [facility] ED for LLE DVT study and follow up with PCP. Electronic Signature(s) Signed: 03/21/2016 4:25:20 PM By: Christin Fudge MD, FACS Signed: 03/22/2016 10:50:50 AM By: Gretta Cool RN, BSN, Kim RN, BSN Entered By: Gretta Cool, RN, BSN, Kim on 03/21/2016 14:24:17 VELICIA, DEJAGER ([id number]) -------------------------------------------------------------------------------- Problem List Details Patient Name: [name], [name] 03/21/2016 1:30 Date of Service: PM Medical Record [id number] Number: Patient Account Number: [id number] 03/20/1935 (81 y.o. Treating RN: Cornell Barman Date of Birth/Sex: Female) Other Clinician: Primary Care Physician: [name] Treating Christin Fudge Referring Physician: [name] Physician/Extender: Suella Grove in Treatment: 17 Active Problems ICD-10 Encounter Code Description Active Date Diagnosis I87.333 Chronic venous hypertension (idiopathic) with ulcer and 11/22/2015 Yes inflammation of bilateral lower extremity I89.0 Lymphedema, not elsewhere classified 11/22/2015 Yes N82.95 Chronic systolic (congestive) heart failure 11/22/2015 Yes S81.812A Laceration without foreign body, left lower leg, initial 12/18/2015 Yes encounter Inactive Problems Resolved Problems Electronic Signature(s) Signed: 03/21/2016 2:06:59 PM By: Christin Fudge MD, FACS Entered By: Christin Fudge on 03/21/2016 14:06:59 [name]  ([id number]) -------------------------------------------------------------------------------- Progress Note Details Patient Name: [name] 03/21/2016 1:30 Date of Service: PM Medical Record [id number] Number: Patient Account Number: [id number] 09/07/1934 (81 y.o. Treating RN: Cornell Barman Date of Birth/Sex: Female) Other Clinician: Primary Care Physician: [name] Treating Christin Fudge Referring Physician: [name] Physician/Extender: Suella Grove in Treatment: 17 Subjective Chief Complaint Information obtained from Patient Patient is here for review of bilateral lower extremity wounds dating back to December 2016 History of Present Illness (HPI) 11/22/15; this is Volkert is a 81 year old woman who lives at home on her own. According to the patient and her daughter was present she has had long-standing edema in her legs dating back many years. She also has a history of chronic systolic heart failure, atrial fibrillation and is status post mitral valve replacement. Last echocardiogram I see in [facility] showed an ejection fraction of 40-45% she is on Lasix 60 mg a day and spironolactone 25 mg a day. Her current problem began in December around Christmas time she developed a small hematoma in the medial part of her left leg which rapidly expanded to a very large hematoma that required surgical debridement. This situation was complicated by the fact that the patient is on long-standing Coumadin for mechanical heart valve. She went to the OR had this evacuated on January 4 /17. The wound has gradually improved however she has developed a small wounds around this area and more recently a wound on the right lateral leg. She is weeping edema fluid. The patient is already been to see vascular surgery. It was recommended that she wear Unna boots, she did not tolerate this due to pain in the left leg. She was then prescribed Juzo stockings and really can't get these on herself  although truthfully there is probably too much edema for a Juzo stockings currently. She is not a diabetic and has no history of PAD or  claudication that I could elicit. She does not use the external compression pumps reliably. She comes today with notes from her primary physician and Dr. Ronalee Belts both recommending various forms of compression but the patient does not really complied with them. Has been using the external compression pumps with some regularity but certainly not daily on the right leg and this has helped. I also note that her daughter tells me the history that she does not sleep in bed at home. She has a hospital bed but with her legs up she finds this painful so she sleeps in the couch sitting up with her legs dependent. 11/29/15; the patient is arrives today accompanied by her son. He expresses satisfaction that she is maintained the compression all week. 12/06/15; the patient has kept her Profore light wraps on, we have good edema control no major change in the wounds we have been using Aquacel 12/13/15; changed to RTD last week. One of the 3 wounds on her left medial leg is healed she has 2 remaining wounds here and one on the right lateral leg. 12/18/2015 -- the patient was at Dr. Nino Parsley office today and he was seeing her for an arterial study. While the wrap was being removed she had an inadvertent laceration of the left proximal anterior leg which bled quite profusely and a compression dressing was applied over this and the patient was here to get her Profore wraps done. I was asked to see the patient to make an assessment and treat appropriately. 12/26/15; the patient's injury on the left proximal leg and Steri-Strips removed after soaking. There is an open area here. The original wounds to still open on the right lateral and left medial leg. [name], [name] ([id number]) 01/03/16 patient's injury on her proximal left leg looks quite good. Still small open area on the  medial left leg which appears to be improving. The area on the right lateral leg still substantially open with no real improvement in wound depth. Her edema control is marginal with a Profore light. We have been using RTD for 3-4 weeks without any major change 01/10/16: wounds without s/s of infection. vascular results are pending regarding arterial studies. 01/17/16; patient comes in today complaining of severe pain however I think most of this is in the right hip not related to her wounds. She continues with a oval-shaped wound on the right lateral leg, trauma to the left anterior leg just below her tibial plateau. She has a smaller eschar on the left anterior leg. She is being using Prisma however she informs [place] today that she is actually allergic to silver, nose this from a previous application at [facility] some years ago 01/24/16; edema is not so well controlled today, I think I reduced her to [place] bilaterally last week. The area which was a scissor injury on her left upper anterior lower leg is fully epithelialized. She only has a small open area remaining on the medial aspect of her left leg. The oval-shaped wound on the right mid lateral leg may be a bit smaller. Debrided of surface slough nonviable subcutaneous tissue. I had changed to collagen 2 weeks ago in an attempt to get this to close 01/31/16 all the patient's wounds on her left leg give healed. We have good edema control with bilateral Profore lites which she has been compliant with. She still has the oval-shaped wound on the right lateral calf allergic even this appears to be slowly improving. The patient has juxtalite stockings at home.  She states she thinks she can put these on. She also has external compression pumps at home although I think her compliance with this has not been good in the past. She complains today of edema in her thighs. Tells me she takes 40 mg of Lasix daily 02/21/16; only 1 small wound remains on the  lateral aspect of the right calf. She is using Juzo stockings on the left leg although she complains about difficulty in applying them. She has external compression pumps at home 02/28/16; the small open wound on her right lateral calf is improved in terms of wound area. It appears that she has a wrap injury on the anterior aspect of the upper leg 03/06/16; the small open wound on her right lateral calf has a very small open area remaining. The wrap injury on the anterior aspect of the upper leg also appears better. She arrives today in clinic with a history of dyspnea with minimal exertion starting with the last 2-4 days. She does not describe chest pain. Her son and our intake nurse think she has facial swelling. She has a history of an artificial mitral valve on Coumadin 03/13/16; the small wound on the right lateral calf is no better this week. The superior wrap injury anteriorly requires debridement of surface eschar and nonviable subcutaneous tissue. She has been to her primary doctor with regards to her dyspnea we identified last week. Per the patient's description her Lasix has been increased 03/21/2016 -- patient of Dr. Dellia Nims who could not keep her appointment yesterday but has come in today with the right lower leg looking good and this is the leg which has been treated in the recent past. However, her left lower extremity is extremely swollen, tender and edematous with redness and discoloration. Objective Constitutional Pulse regular. Respirations normal and unlabored. Afebrile. Vitals Time Taken: 1:37 PM, Height: 68 in, Weight: 178 lbs, BMI: 27.1, Temperature: 97.4 F, Pulse: 68 Badon, Arrionna C. ([id number]) bpm, Respiratory Rate: 18 breaths/min, Blood Pressure: 140/60 mmHg. Eyes Nonicteric. Reactive to light. Ears, Nose, Mouth, and Throat Lips, teeth, and gums WNL.Marland Kitchen Moist mucosa without lesions. Neck supple and nontender. No palpable supraclavicular or cervical adenopathy.  Normal sized without goiter. Respiratory WNL. No retractions.. Breath sounds WNL, No rubs, rales, rhonchi, or wheeze.. Cardiovascular Heart rhythm and rate regular, no murmur or gallop.. Pedal Pulses WNL. No clubbing, cyanosis or edema. Chest Breasts symmetical and no nipple discharge.. Breast tissue WNL, no masses, lumps, or tenderness.. Lymphatic No adneopathy. No adenopathy. No adenopathy. Musculoskeletal Adexa without tenderness or enlargement.. Digits and nails w/o clubbing, cyanosis, infection, petechiae, ischemia, or inflammatory conditions.Marland Kitchen Psychiatric Judgement and insight Intact.. No evidence of depression, anxiety, or agitation.. General Notes: the right lower extremity lymphedema is well controlled and the wound is looking clean and there is not much change. Sharp debridement was required today. However, her left lower extremity is extremely swollen, tender and edematous with redness and discoloration. Pulses are palpable and there is tenderness on eliciting Homans sign. Integumentary (Hair, Skin) No suspicious lesions. No crepitus or fluctuance. No peri-wound warmth or erythema. No masses.. Wound #2 status is Healed - Epithelialized. Original cause of wound was Gradually Appeared. The wound is located on the Right,Lateral Lower Leg. The wound measures 0cm length x 0cm width x 0cm depth; 0cm^2 area and 0cm^3 volume. The wound is limited to skin breakdown. There is a small amount of serous drainage noted. The wound margin is distinct with the outline attached to the wound base. There  is large (67-100%) pink granulation within the wound bed. There is no necrotic tissue within the wound bed. The periwound skin appearance exhibited: Localized Edema, Moist. The periwound skin appearance did not exhibit: Callus, Crepitus, Excoriation, Fluctuance, Friable, Induration, Rash, Scarring, Dry/Scaly, Maceration, Atrophie Blanche, Cyanosis, Ecchymosis, Hemosiderin Staining, Mottled, Pallor,  Rubor, Erythema. Periwound temperature was noted as No Abnormality. The periwound has tenderness on palpation. ANUM, PALECEK C. ([id number]) Wound #4 status is Open. Original cause of wound was Shear/Friction. The wound is located on the Right,Proximal Lower Leg. The wound measures 0.2cm length x 0.3cm width x 0.1cm depth; 0.047cm^2 area and 0.005cm^3 volume. Wound #5 status is Open. Original cause of wound was Shear/Friction. The wound is located on the Right,Distal Lower Leg. The wound measures 0.6cm length x 0.6cm width x 0.1cm depth; 0.283cm^2 area and 0.028cm^3 volume. Assessment Active Problems ICD-10 I87.333 - Chronic venous hypertension (idiopathic) with ulcer and inflammation of bilateral lower extremity I89.0 - Lymphedema, not elsewhere classified X90.24 - Chronic systolic (congestive) heart failure O97.353G - Laceration without foreign body, left lower leg, initial encounter Plan Wound Cleansing: Wound #4 Right,Proximal Lower Leg: Cleanse wound with mild soap and water May shower with protection. Wound #5 Right,Distal Lower Leg: Cleanse wound with mild soap and water May shower with protection. Primary Wound Dressing: Wound #4 Right,Proximal Lower Leg: Hydrafera Blue Wound #5 Right,Distal Lower Leg: Hydrafera Blue Secondary Dressing: Wound #4 Right,Proximal Lower Leg: ABD pad Wound #5 Right,Distal Lower Leg: ABD pad Dressing Change Frequency: Wound #4 Right,Proximal Lower Leg: Change dressing every week Wound #5 Right,Distal Lower Leg: Change dressing every week [name], [name]. ([id number]) Follow-up Appointments: Wound #4 Right,Proximal Lower Leg: Return Appointment in 1 week. Wound #5 Right,Distal Lower Leg: Return Appointment in 1 week. Edema Control: Wound #4 Right,Proximal Lower Leg: 4-Layer Compression System - Right Lower Extremity Elevate legs to the level of the heart and pump ankles as often as possible Wound #5 Right,Distal Lower  Leg: 4-Layer Compression System - Right Lower Extremity Elevate legs to the level of the heart and pump ankles as often as possible Additional Orders / Instructions: Wound #4 Right,Proximal Lower Leg: Increase protein intake. Wound #5 Right,Distal Lower Leg: Increase protein intake. Services and Therapies ordered were: Arterial Studies- Unilateral - Patient instructed to go directly to [facility] ED for LLE DVT study and follow up with PCP. I have recommended Hydrofera Blue and a Profore wrap to the right lower extremity as done last week. Regarding the new signs on the left lower extremity have recommended: 1. DVT study to be done at the hospital today. 2. Either see her PCP or the ER ASAP regarding management of the left lower extremity. Appropriate blood work has been recommended and is indicated. 3. I would be happy to speak with Dr. Sabra Heck if he is available to talk to me 4. The above details have been discussed in detail with the patient and her son at the bedside and they have had all questions answered Electronic Signature(s) Signed: 03/21/2016 4:27:45 PM By: Christin Fudge MD, FACS Previous Signature: 03/21/2016 4:27:32 PM Version By: Christin Fudge MD, FACS Previous Signature: 03/21/2016 2:14:57 PM Version By: Christin Fudge MD, FACS Entered By: Christin Fudge on 03/21/2016 16:27:45 Glazebrook, Augustine Loletha Grayer ([id number]) -------------------------------------------------------------------------------- SuperBill Details Patient Name: Carrie Mulligan C. Date of Service: 03/21/2016 Medical Record Number: [id number] Patient Account Number: [id number] Date of Birth/Sex: [birth year] (81 y.o. Female) Treating RN: Cornell Barman Primary Care Physician: [name] Other Clinician: Referring Physician: [name] Treating Physician/Extender: Christin Fudge  Weeks in Treatment: 17 Diagnosis Coding ICD-10 Codes Code Description Chronic venous hypertension (idiopathic) with ulcer and inflammation of  bilateral lower I87.333 extremity I89.0 Lymphedema, not elsewhere classified T05.69 Chronic systolic (congestive) heart failure V94.801K Laceration without foreign body, left lower leg, initial encounter Facility Procedures CPT4: Description Modifier Quantity Code [id number] (Facility Use Only) [phone] - APPLY [place] RT 1 LEG Physician Procedures CPT4: Description Modifier Quantity Code [id number] - WC PHYS LEVEL 3 - EST PT 1 ICD-10 Description Diagnosis I87.333 Chronic venous hypertension (idiopathic) with ulcer and inflammation of bilateral lower extremity I89.0 Lymphedema, not elsewhere  classified X58.83 Chronic systolic (congestive) heart failure G54.982M Laceration without foreign body, left lower leg, initial encounter Electronic Signature(s) Signed: 03/21/2016 4:25:20 PM By: Christin Fudge MD, FACS Signed: 03/22/2016 10:50:50 AM By: Gretta Cool RN, BSN, Kim RN, BSN Previous Signature: 03/21/2016 2:15:11 PM Version By: Christin Fudge MD, FACS Entered By: Gretta Cool, RN, BSN, Kim on 03/21/2016 14:21:31

## 2016-03-26 ENCOUNTER — Other Ambulatory Visit: Payer: Self-pay | Admitting: Orthopedic Surgery

## 2016-03-27 ENCOUNTER — Telehealth (HOSPITAL_COMMUNITY): Payer: Self-pay | Admitting: Radiology

## 2016-03-27 ENCOUNTER — Encounter: Payer: Medicare Other | Admitting: Internal Medicine

## 2016-03-27 DIAGNOSIS — I5042 Chronic combined systolic (congestive) and diastolic (congestive) heart failure: Secondary | ICD-10-CM | POA: Diagnosis not present

## 2016-03-27 NOTE — Telephone Encounter (Signed)
Called pt's daughter, left VM for her to call to schedule consult. JM

## 2016-03-29 ENCOUNTER — Other Ambulatory Visit (HOSPITAL_COMMUNITY): Payer: Self-pay | Admitting: Interventional Radiology

## 2016-03-29 DIAGNOSIS — IMO0002 Reserved for concepts with insufficient information to code with codable children: Secondary | ICD-10-CM

## 2016-03-29 DIAGNOSIS — M549 Dorsalgia, unspecified: Secondary | ICD-10-CM

## 2016-04-01 ENCOUNTER — Ambulatory Visit (INDEPENDENT_AMBULATORY_CARE_PROVIDER_SITE_OTHER): Payer: Medicare Other | Admitting: Vascular Surgery

## 2016-04-01 ENCOUNTER — Ambulatory Visit (HOSPITAL_COMMUNITY)
Admission: RE | Admit: 2016-04-01 | Discharge: 2016-04-01 | Disposition: A | Discharge status: Home / Self Care | Payer: Medicare Other | Source: Ambulatory Visit | Attending: Interventional Radiology | Admitting: Interventional Radiology

## 2016-04-01 DIAGNOSIS — IMO0002 Reserved for concepts with insufficient information to code with codable children: Secondary | ICD-10-CM

## 2016-04-01 DIAGNOSIS — M549 Dorsalgia, unspecified: Secondary | ICD-10-CM

## 2016-04-01 HISTORY — PX: IR GENERIC HISTORICAL: IMG1180011

## 2016-04-03 ENCOUNTER — Encounter: Payer: Medicare Other | Attending: Physician Assistant | Admitting: Physician Assistant

## 2016-04-03 DIAGNOSIS — Z952 Presence of prosthetic heart valve: Secondary | ICD-10-CM | POA: Diagnosis not present

## 2016-04-03 DIAGNOSIS — Z79899 Other long term (current) drug therapy: Secondary | ICD-10-CM | POA: Insufficient documentation

## 2016-04-03 DIAGNOSIS — X58XXXS Exposure to other specified factors, sequela: Secondary | ICD-10-CM | POA: Diagnosis not present

## 2016-04-03 DIAGNOSIS — I87333 Chronic venous hypertension (idiopathic) with ulcer and inflammation of bilateral lower extremity: Secondary | ICD-10-CM | POA: Insufficient documentation

## 2016-04-03 DIAGNOSIS — S81812S Laceration without foreign body, left lower leg, sequela: Secondary | ICD-10-CM | POA: Diagnosis not present

## 2016-04-03 DIAGNOSIS — I4891 Unspecified atrial fibrillation: Secondary | ICD-10-CM | POA: Insufficient documentation

## 2016-04-03 DIAGNOSIS — I890 Lymphedema, not elsewhere classified: Secondary | ICD-10-CM | POA: Insufficient documentation

## 2016-04-03 DIAGNOSIS — Z7901 Long term (current) use of anticoagulants: Secondary | ICD-10-CM | POA: Insufficient documentation

## 2016-04-03 DIAGNOSIS — I5022 Chronic systolic (congestive) heart failure: Secondary | ICD-10-CM | POA: Diagnosis not present

## 2016-04-03 DIAGNOSIS — L97211 Non-pressure chronic ulcer of right calf limited to breakdown of skin: Secondary | ICD-10-CM | POA: Insufficient documentation

## 2016-04-04 ENCOUNTER — Encounter (INDEPENDENT_AMBULATORY_CARE_PROVIDER_SITE_OTHER): Payer: Self-pay | Admitting: Vascular Surgery

## 2016-04-04 ENCOUNTER — Ambulatory Visit (INDEPENDENT_AMBULATORY_CARE_PROVIDER_SITE_OTHER): Payer: Medicare Other | Admitting: Vascular Surgery

## 2016-04-04 VITALS — BP 129/68 | HR 60 | Resp 16 | Ht 68.0 in | Wt 155.0 lb

## 2016-04-04 DIAGNOSIS — Z952 Presence of prosthetic heart valve: Secondary | ICD-10-CM

## 2016-04-04 DIAGNOSIS — I890 Lymphedema, not elsewhere classified: Secondary | ICD-10-CM

## 2016-04-04 DIAGNOSIS — R600 Localized edema: Secondary | ICD-10-CM | POA: Diagnosis not present

## 2016-04-04 DIAGNOSIS — N183 Chronic kidney disease, stage 3 unspecified: Secondary | ICD-10-CM

## 2016-04-04 MED ORDER — ENOXAPARIN SODIUM 80 MG/0.8ML ~~LOC~~ SOLN: 80.0000 mg | Freq: Two times a day (BID) | SUBCUTANEOUS | 1 refills | Status: DC

## 2016-04-04 NOTE — Progress Notes (Signed)
KANDEE, ESCALANTE ([id number]) Visit Report for 04/03/2016 Chief Complaint Document Details Patient Name: [name], [name]. Date of Service: 04/03/2016 2:15 PM Medical Record Patient Account Number: [id number] Number: Afful, RN, BSN, Treating RN: 1935/06/16 (81 y.o. [name] Date of Birth/Sex: Female) Other Clinician: Primary Care Physician: [name] Treating STONE III, Betsi Crespi Referring Physician: [name] Physician/Extender: Weeks in Treatment: 19 Information Obtained from: Patient Chief Complaint Patient here for reevaluation of her left lower extremity wound Electronic Signature(s) Signed: 04/04/2016 1:37:32 AM By: Worthy Keeler PA-C Entered By: Worthy Keeler on 04/03/2016 16:02:23 [name], [name] ([id number]) -------------------------------------------------------------------------------- HPI Details Patient Name: Carrie Mulligan C. Date of Service: 04/03/2016 2:15 PM Medical Record Patient Account Number: [id number] Number: Afful, RN, BSN, Treating RN: 1934/07/10 (81 y.o. [name] Date of Birth/Sex: Female) Other Clinician: Primary Care Physician: [name] Treating STONE III, [name] Referring Physician: [name] Physician/Extender: Suella Grove in Treatment: 19 History of Present Illness HPI Description: 11/22/15; this is Wilcoxen is a 81 year old woman who lives at home on her own. According to the patient and her daughter was present she has had long-standing edema in her legs dating back many years. She also has a history of chronic systolic heart failure, atrial fibrillation and is status post mitral valve replacement. Last echocardiogram I see in [facility] showed an ejection fraction of 40-45% she is on Lasix 60 mg a day and spironolactone 25 mg a day. Her current problem began in December around Christmas time she developed a small hematoma in the medial part of her left leg which rapidly expanded to a very large hematoma that  required surgical debridement. This situation was complicated by the fact that the patient is on long-standing Coumadin for mechanical heart valve. She went to the OR had this evacuated on January 4 /17. The wound has gradually improved however she has developed a small wounds around this area and more recently a wound on the right lateral leg. She is weeping edema fluid. The patient is already been to see vascular surgery. It was recommended that she wear Unna boots, she did not tolerate this due to pain in the left leg. She was then prescribed Juzo stockings and really can't get these on herself although truthfully there is probably too much edema for a Juzo stockings currently. She is not a diabetic and has no history of PAD or claudication that I could elicit. She does not use the external compression pumps reliably. She comes today with notes from her primary physician and Dr. Ronalee Belts both recommending various forms of compression but the patient does not really complied with them. Has been using the external compression pumps with some regularity but certainly not daily on the right leg and this has helped. I also note that her daughter tells me the history that she does not sleep in bed at home. She has a hospital bed but with her legs up she finds this painful so she sleeps in the couch sitting up with her legs dependent. 11/29/15; the patient is arrives today accompanied by her son. He expresses satisfaction that she is maintained the compression all week. 12/06/15; the patient has kept her Profore light wraps on, we have good edema control no major change in the wounds we have been using Aquacel 12/13/15; changed to RTD last week. One of the 3 wounds on her left medial leg is healed she has 2 remaining wounds here and one on the right lateral leg. 12/18/2015 -- the patient was at  Dr. Nino Parsley office today and he was seeing her for an arterial study. While the wrap was being removed she  had an inadvertent laceration of the left proximal anterior leg which bled quite profusely and a compression dressing was applied over this and the patient was here to get her Profore wraps done. I was asked to see the patient to make an assessment and treat appropriately. 12/26/15; the patient's injury on the left proximal leg and Steri-Strips removed after soaking. There is an open area here. The original wounds to still open on the right lateral and left medial leg. 01/03/16 patient's injury on her proximal left leg looks quite good. Still small open area on the medial left leg which appears to be improving. The area on the right lateral leg still substantially open with no real improvement in wound depth. Her edema control is marginal with a Profore light. We have been using RTD for 3-4 weeks without any major change 01/10/16: wounds without s/s of infection. vascular results are pending regarding arterial studies. [name], [name] ([id number]) 01/17/16; patient comes in today complaining of severe pain however I think most of this is in the right hip not related to her wounds. She continues with a oval-shaped wound on the right lateral leg, trauma to the left anterior leg just below her tibial plateau. She has a smaller eschar on the left anterior leg. She is being using Prisma however she informs [place] today that she is actually allergic to silver, nose this from a previous application at [facility] some years ago 01/24/16; edema is not so well controlled today, I think I reduced her to [place] bilaterally last week. The area which was a scissor injury on her left upper anterior lower leg is fully epithelialized. She only has a small open area remaining on the medial aspect of her left leg. The oval-shaped wound on the right mid lateral leg may be a bit smaller. Debrided of surface slough nonviable subcutaneous tissue. I had changed to collagen 2 weeks ago in an attempt to get this to  close 01/31/16 all the patient's wounds on her left leg give healed. We have good edema control with bilateral Profore lites which she has been compliant with. She still has the oval-shaped wound on the right lateral calf allergic even this appears to be slowly improving. The patient has juxtalite stockings at home. She states she thinks she can put these on. She also has external compression pumps at home although I think her compliance with this has not been good in the past. She complains today of edema in her thighs. Tells me she takes 40 mg of Lasix daily 02/21/16; only 1 small wound remains on the lateral aspect of the right calf. She is using Juzo stockings on the left leg although she complains about difficulty in applying them. She has external compression pumps at home 02/28/16; the small open wound on her right lateral calf is improved in terms of wound area. It appears that she has a wrap injury on the anterior aspect of the upper leg 03/06/16; the small open wound on her right lateral calf has a very small open area remaining. The wrap injury on the anterior aspect of the upper leg also appears better. She arrives today in clinic with a history of dyspnea with minimal exertion starting with the last 2-4 days. She does not describe chest pain. Her son and our intake nurse think she has facial swelling. She has a history of  an artificial mitral valve on Coumadin 03/13/16; the small wound on the right lateral calf is no better this week. The superior wrap injury anteriorly requires debridement of surface eschar and nonviable subcutaneous tissue. She has been to her primary doctor with regards to her dyspnea we identified last week. Per the patient's description her Lasix has been increased 03/21/2016 -- patient of Dr. Dellia Nims who could not keep her appointment yesterday but has come in today with the right lower leg looking good and this is the leg which has been treated in the recent past.  However, her left lower extremity is extremely swollen, tender and edematous with redness and discoloration. 03/27/16; there is only 2 small areas remain on the right leg. The edema that was so concerning last week has come down however there is extensive bruising on the lateral left leg medial left foot suggesting that she lost some blood into the leg itself. I checked her hemoglobin on 9/21 was 14.5. Her INR was over 6. Duplex ultrasound was negative for DVT 04/03/16 at this point in time today patient is actually doing substantially better in regard to the wound on the anterior right lower extremity. currently there is no slough or eschar noted and no evidence of erythema, discharge, or local infection. She is exhibiting no signs of systemic infection. She is tolerating the dressing changes as well as the wrapping at this point in time. Electronic Signature(s) Signed: 04/04/2016 1:37:32 AM By: Worthy Keeler PA-C Entered By: Worthy Keeler on 04/03/2016 16:04:02 Kras, [name] ([id number]) -------------------------------------------------------------------------------- Physical Exam Details Patient Name: FAE, BLOSSOM C. Date of Service: 04/03/2016 2:15 PM Medical Record Patient Account Number: [id number] Number: Afful, RN, BSN, Treating RN: 29-May-1935 (81 y.o. [name] Date of Birth/Sex: Female) Other Clinician: Primary Care Physician: [name] Treating STONE III, Iwao Shamblin Referring Physician: [name] Physician/Extender: Weeks in Treatment: 58 Constitutional Well-nourished and well-hydrated in no acute distress. Respiratory normal breathing without difficulty. clear to auscultation bilaterally. Cardiovascular regular rate and rhythm with normal S1, S2. 2+ dorsalis pedis/posterior tibialis pulses. bilateral 1+ pitting edema noted. Integumentary (Hair, Skin) No slough is noted over the open wound of the right lower extremity.Marland Kitchen Psychiatric this patient is  able to make decisions and demonstrates good insight into disease process. Alert and Oriented x 3. pleasant and cooperative. Electronic Signature(s) Signed: 04/04/2016 1:37:32 AM By: Worthy Keeler PA-C Entered By: Worthy Keeler on 04/03/2016 16:04:57 Blazier, [name] ([id number]) -------------------------------------------------------------------------------- Physician Orders Details Patient Name: [name]. Date of Service: 04/03/2016 2:15 PM Medical Record Patient Account Number: [id number] Number: Afful, RN, BSN, Treating RN: 1935-03-01 (80 y.o. [name] Date of Birth/Sex: Female) Other Clinician: Primary Care Physician: [name] Treating STONE III, Jenness Stemler Referring Physician: [name] Physician/Extender: Suella Grove in Treatment: 13 Verbal / Phone Orders: Yes Clinician: Afful, RN, BSN, Rita Read Back and Verified: Yes Diagnosis Coding Wound Cleansing Wound #4 Right,Proximal Lower Leg o Cleanse wound with mild soap and water o May shower with protection. Primary Wound Dressing Wound #4 Right,Proximal Lower Leg o Prisma Ag Secondary Dressing Wound #4 Right,Proximal Lower Leg o Boardered Foam Dressing Dressing Change Frequency Wound #4 Right,Proximal Lower Leg o Change dressing every other day. Follow-up Appointments Wound #4 Right,Proximal Lower Leg o Return Appointment in 1 week. Edema Control Wound #4 Right,Proximal Lower Leg o Patient to wear own compression stockings o Elevate legs to the level of the heart and pump ankles as often as possible Additional Orders / Instructions Wound #4 Right,Proximal  Lower Leg o Increase protein intake. o Activity as tolerated Electronic Signature(s) OPLE, GIRGIS ([id number]) Signed: 04/03/2016 5:28:42 PM By: Regan [name] BSN, RN Signed: 04/04/2016 1:37:32 AM By: Worthy Keeler PA-C Entered By: Regan [name] on 04/03/2016 14:51:59 Kable, [name]  ([id number]) -------------------------------------------------------------------------------- Problem List Details Patient Name: JESSILYNN, TAFT C. Date of Service: 04/03/2016 2:15 PM Medical Record Patient Account Number: [id number] Number: Afful, RN, BSN, Treating RN: April 03, 1935 (81 y.o. [name] Date of Birth/Sex: Female) Other Clinician: Primary Care Physician: [name] Treating STONE III, Nabeeha Badertscher Referring Physician: [name] Physician/Extender: Suella Grove in Treatment: 19 Active Problems ICD-10 Encounter Code Description Active Date Diagnosis S81.812S Laceration without foreign body, left lower leg, sequela 04/03/2016 Yes I87.333 Chronic venous hypertension (idiopathic) with ulcer and 04/03/2016 Yes inflammation of bilateral lower extremity I89.0 Lymphedema, not elsewhere classified 04/03/2016 Yes G29.52 Chronic systolic (congestive) heart failure 04/03/2016 Yes Inactive Problems Resolved Problems Electronic Signature(s) Signed: 04/04/2016 1:37:32 AM By: Worthy Keeler PA-C Entered By: Worthy Keeler on 04/03/2016 16:00:47 Tango, Naarah C. ([id number]) -------------------------------------------------------------------------------- Progress Note Details Patient Name: Carrie Mulligan C. Date of Service: 04/03/2016 2:15 PM Medical Record Patient Account Number: [id number] Number: Afful, RN, BSN, Treating RN: 1934/07/21 (81 y.o. [name] Date of Birth/Sex: Female) Other Clinician: Primary Care Physician: [name] Treating STONE III, Desmen Schoffstall Referring Physician: [name] Physician/Extender: Suella Grove in Treatment: 19 Subjective Chief Complaint Information obtained from Patient Patient here for reevaluation of her left lower extremity wound History of Present Illness (HPI) 11/22/15; this is Mccarthy is a 81 year old woman who lives at home on her own. According to the patient and her daughter was present she has had long-standing edema in her legs  dating back many years. She also has a history of chronic systolic heart failure, atrial fibrillation and is status post mitral valve replacement. Last echocardiogram I see in [facility] showed an ejection fraction of 40-45% she is on Lasix 60 mg a day and spironolactone 25 mg a day. Her current problem began in December around Christmas time she developed a small hematoma in the medial part of her left leg which rapidly expanded to a very large hematoma that required surgical debridement. This situation was complicated by the fact that the patient is on long-standing Coumadin for mechanical heart valve. She went to the OR had this evacuated on January 4 /17. The wound has gradually improved however she has developed a small wounds around this area and more recently a wound on the right lateral leg. She is weeping edema fluid. The patient is already been to see vascular surgery. It was recommended that she wear Unna boots, she did not tolerate this due to pain in the left leg. She was then prescribed Juzo stockings and really can't get these on herself although truthfully there is probably too much edema for a Juzo stockings currently. She is not a diabetic and has no history of PAD or claudication that I could elicit. She does not use the external compression pumps reliably. She comes today with notes from her primary physician and Dr. Ronalee Belts both recommending various forms of compression but the patient does not really complied with them. Has been using the external compression pumps with some regularity but certainly not daily on the right leg and this has helped. I also note that her daughter tells me the history that she does not sleep in bed at home. She has a hospital bed but with her legs up she finds this painful so she sleeps  in the couch sitting up with her legs dependent. 11/29/15; the patient is arrives today accompanied by her son. He expresses satisfaction that she  is maintained the compression all week. 12/06/15; the patient has kept her Profore light wraps on, we have good edema control no major change in the wounds we have been using Aquacel 12/13/15; changed to RTD last week. One of the 3 wounds on her left medial leg is healed she has 2 remaining wounds here and one on the right lateral leg. 12/18/2015 -- the patient was at Dr. Nino Parsley office today and he was seeing her for an arterial study. While the wrap was being removed she had an inadvertent laceration of the left proximal anterior leg which bled quite profusely and a compression dressing was applied over this and the patient was here to get her Profore wraps done. I was asked to see the patient to make an assessment and treat appropriately. 12/26/15; the patient's injury on the left proximal leg and Steri-Strips removed after soaking. There is an open area here. The original wounds to still open on the right lateral and left medial leg. [name], [name] ([id number]) 01/03/16 patient's injury on her proximal left leg looks quite good. Still small open area on the medial left leg which appears to be improving. The area on the right lateral leg still substantially open with no real improvement in wound depth. Her edema control is marginal with a Profore light. We have been using RTD for 3-4 weeks without any major change 01/10/16: wounds without s/s of infection. vascular results are pending regarding arterial studies. 01/17/16; patient comes in today complaining of severe pain however I think most of this is in the right hip not related to her wounds. She continues with a oval-shaped wound on the right lateral leg, trauma to the left anterior leg just below her tibial plateau. She has a smaller eschar on the left anterior leg. She is being using Prisma however she informs [place] today that she is actually allergic to silver, nose this from a previous application at [facility] some years ago 01/24/16; edema  is not so well controlled today, I think I reduced her to [place] bilaterally last week. The area which was a scissor injury on her left upper anterior lower leg is fully epithelialized. She only has a small open area remaining on the medial aspect of her left leg. The oval-shaped wound on the right mid lateral leg may be a bit smaller. Debrided of surface slough nonviable subcutaneous tissue. I had changed to collagen 2 weeks ago in an attempt to get this to close 01/31/16 all the patient's wounds on her left leg give healed. We have good edema control with bilateral Profore lites which she has been compliant with. She still has the oval-shaped wound on the right lateral calf allergic even this appears to be slowly improving. The patient has juxtalite stockings at home. She states she thinks she can put these on. She also has external compression pumps at home although I think her compliance with this has not been good in the past. She complains today of edema in her thighs. Tells me she takes 40 mg of Lasix daily 02/21/16; only 1 small wound remains on the lateral aspect of the right calf. She is using Juzo stockings on the left leg although she complains about difficulty in applying them. She has external compression pumps at home 02/28/16; the small open wound on her right lateral calf is improved  in terms of wound area. It appears that she has a wrap injury on the anterior aspect of the upper leg 03/06/16; the small open wound on her right lateral calf has a very small open area remaining. The wrap injury on the anterior aspect of the upper leg also appears better. She arrives today in clinic with a history of dyspnea with minimal exertion starting with the last 2-4 days. She does not describe chest pain. Her son and our intake nurse think she has facial swelling. She has a history of an artificial mitral valve on Coumadin 03/13/16; the small wound on the right lateral calf is no better this  week. The superior wrap injury anteriorly requires debridement of surface eschar and nonviable subcutaneous tissue. She has been to her primary doctor with regards to her dyspnea we identified last week. Per the patient's description her Lasix has been increased 03/21/2016 -- patient of Dr. Dellia Nims who could not keep her appointment yesterday but has come in today with the right lower leg looking good and this is the leg which has been treated in the recent past. However, her left lower extremity is extremely swollen, tender and edematous with redness and discoloration. 03/27/16; there is only 2 small areas remain on the right leg. The edema that was so concerning last week has come down however there is extensive bruising on the lateral left leg medial left foot suggesting that she lost some blood into the leg itself. I checked her hemoglobin on 9/21 was 14.5. Her INR was over 6. Duplex ultrasound was negative for DVT 04/03/16 at this point in time today patient is actually doing substantially better in regard to the wound on the anterior right lower extremity. currently there is no slough or eschar noted and no evidence of erythema, discharge, or local infection. She is exhibiting no signs of systemic infection. She is tolerating the dressing changes as well as the wrapping at this point in time. [name], [name] ([id number]) Objective Constitutional Well-nourished and well-hydrated in no acute distress. Vitals Time Taken: 2:34 PM, Height: 68 in, Weight: 178 lbs, BMI: 27.1, Pulse: 66 bpm, Respiratory Rate: 17 breaths/min, Blood Pressure: 125/62 mmHg. Respiratory normal breathing without difficulty. clear to auscultation bilaterally. Cardiovascular regular rate and rhythm with normal S1, S2. 2+ dorsalis pedis/posterior tibialis pulses. bilateral 1+ pitting edema noted. Psychiatric this patient is able to make decisions and demonstrates good insight into disease process. Alert  and Oriented x 3. pleasant and cooperative. Integumentary (Hair, Skin) No slough is noted over the open wound of the right lower extremity.. Wound #4 status is Open. Original cause of wound was Shear/Friction. The wound is located on the Right,Proximal Lower Leg. The wound measures 0.2cm length x 0.2cm width x 0.1cm depth; 0.031cm^2 area and 0.003cm^3 volume. The wound is limited to skin breakdown. There is no tunneling or undermining noted. There is a small amount of sanguinous drainage noted. The wound margin is distinct with the outline attached to the wound base. There is large (67-100%) granulation within the wound bed. There is no necrotic tissue within the wound bed. The periwound skin appearance exhibited: Localized Edema, Moist, Mottled. The periwound skin appearance did not exhibit: Callus, Crepitus, Excoriation, Fluctuance, Friable, Induration, Rash, Scarring, Dry/Scaly, Maceration, Atrophie Blanche, Cyanosis, Ecchymosis, Hemosiderin Staining, Pallor, Rubor, Erythema. Periwound temperature was noted as No Abnormality. The periwound has tenderness on palpation. Assessment Active Problems ICD-10 S81.812S - Laceration without foreign body, left lower leg, sequela I87.333 - Chronic venous hypertension (idiopathic) with  ulcer and inflammation of bilateral lower extremity I89.0 - Lymphedema, not elsewhere classified [name], [name]. ([id number]) I34.74 - Chronic systolic (congestive) heart failure Diagnoses ICD-10 S81.812S: Laceration without foreign body, left lower leg, sequela I87.333: Chronic venous hypertension (idiopathic) with ulcer and inflammation of bilateral lower extremity I89.0: Lymphedema, not elsewhere classified Q59.56: Chronic systolic (congestive) heart failure Plan Wound Cleansing: Wound #4 Right,Proximal Lower Leg: Cleanse wound with mild soap and water May shower with protection. Primary Wound Dressing: Wound #4 Right,Proximal Lower Leg: Prisma  Ag Secondary Dressing: Wound #4 Right,Proximal Lower Leg: Boardered Foam Dressing Dressing Change Frequency: Wound #4 Right,Proximal Lower Leg: Change dressing every other day. Follow-up Appointments: Wound #4 Right,Proximal Lower Leg: Return Appointment in 1 week. Edema Control: Wound #4 Right,Proximal Lower Leg: Patient to wear own compression stockings Elevate legs to the level of the heart and pump ankles as often as possible Additional Orders / Instructions: Wound #4 Right,Proximal Lower Leg: Increase protein intake. Activity as tolerated Follow-Up Appointments: A Patient Clinical Summary of Care was provided to [facility], [name] ([id number]) At this point in time the wound appears to be very clean currently. I'm going to recommend that we switch her to collagen without silver as she tells me that she is allergic to silver. We will see how this does over the next week as clean as the wound appears I think that she is going to do very well. If she has any concerns or questions in the meantime she will contact the office and let us know. Otherwise her son was available during the evaluation today to ask any questions he had as well. All were answered to the best of my ability. As she was leaving she mentioned that she had a "sore" on the buttock region. I did briefly inspect this and it appears to be superficial ulceration possibly stage II currently. I recommended over-the- counter zinc oxide paste for the next week. We will reevaluate this following week and get a better look at the region and further make recommendations if anything needs to be changed. In the interim I suggested that she avoid any pressure to the sacral region specifically recommend getting a foam pillow and has a cutout for the sacral area to avoid pressure to this sacral/gluteal region. There are no agreement with this plan. Electronic Signature(s) Signed: 04/04/2016 1:37:32 AM By: Worthy Keeler  PA-C Entered By: Worthy Keeler on 04/03/2016 16:09:00 [name], [name] ([id number]) -------------------------------------------------------------------------------- SuperBill Details Patient Name: [name]. Date of Service: 04/03/2016 Medical Record Patient Account Number: [id number] Number: Afful, RN, BSN, Treating RN: 11/21/1934 (81 y.o. [name] Date of Birth/Sex: Female) Other Clinician: Primary Care Physician: [name] Treating STONE III, Lawyer Washabaugh Referring Physician: [name] Physician/Extender: Weeks in Treatment: 19 Diagnosis Coding ICD-10 Codes Code Description S81.812S Laceration without foreign body, left lower leg, sequela Chronic venous hypertension (idiopathic) with ulcer and inflammation of bilateral lower I87.333 extremity I89.0 Lymphedema, not elsewhere classified X32.35 Chronic systolic (congestive) heart failure Facility Procedures CPT4 Code: 57322025 Description: 42706 - WOUND CARE VISIT-LEV 2 EST PT Modifier: Quantity: 1 Physician Procedures CPT4: Description Modifier Quantity Code 2376283 15176 - WC PHYS LEVEL 3 - EST PT 1 ICD-10 Description Diagnosis S81.812S Laceration without foreign body, left lower leg, sequela I87.333 Chronic venous hypertension (idiopathic) with ulcer and  inflammation of bilateral lower extremity I89.0 Lymphedema, not elsewhere classified H60.73 Chronic systolic (congestive) heart failure Electronic Signature(s) Signed: 04/04/2016 1:37:32 AM By: Worthy Keeler PA-C Entered By: Melburn Hake,  Tandra Rosado on 04/03/2016 16:07:57

## 2016-04-04 NOTE — Progress Notes (Signed)
MRN : [id number]  [name] is a 81 y.o. ([birth year]) female who presents with chief complaint of  Chief Complaint  Patient presents with  . Follow-up  .  History of Present Illness: Patient returns today in follow for follow-up regarding her lymphedema and venous insufficiency. She is also concerned regarding the bruising she is experiencing.  Patient's leg wounds have now healed and she states she has been discharged from the wound care center. She has continued using the compression wraps.  No fever chills no increase in her lower extremity pain this remains at its baseline.  Patient denies shortness of breath or pleuritic chest pains.  Of note the patient fell and has sustained multiple compression fractures. She is to be evaluated by the spine service and is requesting Lovenox as a bridge for her Coumadin so that she can undergo treatment of her compression fractures.  Current Outpatient Prescriptions  Medication Sig Dispense Refill  . acetaminophen (TYLENOL) 500 MG tablet Take 500 mg by mouth every 6 (six) hours as needed for mild pain, moderate pain, fever or headache.    Marland Kitchen aspirin EC 81 MG tablet Take 81 mg by mouth daily.     . furosemide (LASIX) 40 MG tablet Take 60 mg by mouth daily.    . metoprolol succinate (TOPROL-XL) 25 MG 24 hr tablet Take 1 tablet (25 mg total) by mouth daily. 30 tablet 2  . oxyCODONE (OXY IR/ROXICODONE) 5 MG immediate release tablet     . pantoprazole (PROTONIX) 40 MG tablet Take 40 mg by mouth daily.     Marland Kitchen spironolactone (ALDACTONE) 25 MG tablet Take 25 mg by mouth daily.    Marland Kitchen warfarin (COUMADIN) 5 MG tablet Take 1 tablet (5 mg total) by mouth daily. 30 tablet 2  . enoxaparin (LOVENOX) 80 MG/0.8ML injection Inject 0.8 mLs (80 mg total) into the skin every 12 (twelve) hours. 14 Syringe 1   No current facility-administered medications for this visit.     Past Medical History:  Diagnosis Date  . Arthritis   . Chronic atrial fibrillation  ([place])    a. On Coumadin  . Chronic combined systolic (congestive) and diastolic (congestive) heart failure    a. EF 25% by echo in 08/2015 b. RHC in 08/2015 showed normal filling pressures  . Compression fracture   . GERD (gastroesophageal reflux disease)   . Hypertension   . S/P MVR (mitral valve replacement)    a. MVR 1994 b. redo MVR in 08/2014 - on Coumadin  . Shortness of breath dyspnea     Past Surgical History:  Procedure Laterality Date  . BREAST SURGERY Right 2005?   benign lump excision  . CARDIAC CATHETERIZATION    . [place], 2016  . CATARACT EXTRACTION W/PHACO Right 02/20/2015   Procedure: CATARACT EXTRACTION PHACO AND INTRAOCULAR LENS PLACEMENT (IOC);  Surgeon: Estill Cotta, MD;  Location: [facility] ORS;  Service: Ophthalmology;  Laterality: Right;  US01:31.2 AP 25.3% CDE40.13 Fluid lot # [id number]  . ELECTROPHYSIOLOGIC STUDY N/A 10/09/2015   Procedure: CARDIOVERSION;  Surgeon: Wellington Hampshire, MD;  Location: [facility] ORS;  Service: Cardiovascular;  Laterality: N/A;  . IRRIGATION AND DEBRIDEMENT HEMATOMA Left 07/05/2015   Procedure: IRRIGATION AND DEBRIDEMENT HEMATOMA;  Surgeon: Robert Bellow, MD;  Location: [facility] ORS;  Service: General;  Laterality: Left;  . pyloric stenosis    . US ECHOCARDIOGRAPHY      Social History Social History  Substance Use Topics  . Smoking status:  Former Smoker  . Smokeless tobacco: Never Used  . Alcohol use No     Comment: occasional     Family History Family History  Problem Relation Age of Onset  . Stroke Mother   . Renal Disease Father   no family history of bleeding clotting disorders, porphyria or autoimmune disease  Allergies  Allergen Reactions  . Ace Inhibitors     Other reaction(s): Cough  . Hydrocodone Itching and Nausea Only  . Mercury     Other reaction(s): Unknown  . Nickel   . Silver Dermatitis    Severe itching  . Cephalexin Rash  . Penicillins Rash     REVIEW OF SYSTEMS  (Negative unless checked)  Constitutional: _0 Weight loss  _1 Fever  _2 Chills Cardiac: _3 Chest pain   _4 Chest pressure   _5 Palpitations   _6 Shortness of breath when laying flat   _7 Shortness of breath at rest   _8 Shortness of breath with exertion. Vascular:  _9 Pain in legs with walking   _10 Pain in legs at rest   _11 History of DVT   _12 Phlebitis   _13 Swelling in legs   _14 Varicose veins Pulmonary:   _15 Uses home oxygen   _16 Productive cough   _17 Hemoptysis   _18 Wheeze  _19 COPD   _20 Asthma Neurologic:  _21 Dizziness  _22 Blackouts   _23 Seizures   _24 History of stroke   _25 History of TIA  _26 Aphasia   _27 Temporary blindness   _28 Dysphagia   _29 Weakness or numbness in arms   _30 Weakness or numbness in legs Musculoskeletal:  _31 Arthritis   _32 Joint swelling   _33 Joint pain   _34 Low back pain Hematologic:  _35 Easy bruising  _36 Easy bleeding   _37 Hypercoagulable state   _38 Anemic   Gastrointestinal:   _39 Vomiting  _40 Gastroesophageal reflux/heartburn   _41 Abdominal pain Genitourinary:  _42 Chronic kidney disease   _43 Difficult urination  _44 Frequent urination  _45 Burning with urination   _46 Hematuria Skin:  _47 Rashes   _48 Ulcers   _49 Wounds Psychological:  _50  anxiety   _51  depression.  Physical Examination  BP 129/68   Pulse 60   Resp 16   Ht _52  (1.727 m)   Wt 155 lb (70.3 kg)   BMI 23.57 kg/m  Gen:  WD/WN, NAD Head: [place]/AT, No temporalis wasting. Ear/Nose/Throat: Hearing grossly intact, nares w/o erythema or drainage, trachea midline Eyes: PERRLA.  Sclera non-icteric Neck: Supple.  No JVD.  Pulmonary:  Good air movement, no use of accessory muscles. Clear bilaterally Cardiac: RRR, normal S1, S2 Vascular: 3+ pitting edema bilaterally more pronounced in the dorsum of the left foot. Moderate venous changes in the ankles bilaterally.  There is ecchymoses of the left second and third toes. There is small callus on the tip of the left great toe. There are no ulcers or open wounds. Vessel Right Left  Radial Palpable Palpable  Ulnar  Palpable Palpable  Brachial Palpable Palpable  Carotid Palpable, without bruit Palpable, without bruit  Femoral Palpable Palpable  Popliteal Not Palpable Not Palpable  PT Trace Palpable Trace Palpable  DP Trace Palpable Trace Palpable   Gastrointestinal: Non-tender/non-distended. No guarding.  Musculoskeletal: M/S 4/5 throughout.  No deformity or atrophy. Using a walker,  Multiple bruises noted primarily in the forearms bilaterally but also in several locations on the legs Neurologic: CN 2-12 intact. Pain and light touch intact in extremities.  Symmetrical.  Speech is fluent.  Psychiatric: Judgment intact, Mood & affect appropriate for pt's clinical situation. Dermatologic: No rashes or ulcers noted.  No cellulitis or open wounds.    Labs Recent Results (from the past 2160 hour(s))  Basic metabolic panel     Status: Abnormal   Collection Time: 03/21/16  3:07 PM  Result Value Ref Range   Sodium 135 135 - 145 mmol/L   Potassium 4.7 3.5 - 5.1 mmol/L   Chloride 102 101 - 111 mmol/L   CO2 25 22 - 32 mmol/L   Glucose, Bld 123 (H) 65 - 99 mg/dL   BUN 48 (H) 6 - 20 mg/dL   Creatinine, Ser 1.47 (H) 0.44 - 1.00 mg/dL   Calcium 9.6 8.9 - 10.3 mg/dL   GFR calc non Af Amer 33 (L) >60 mL/min   GFR calc Af Amer 38 (L) >60 mL/min    Comment: (NOTE) The eGFR has been calculated using the CKD EPI equation. This calculation has not been validated in all clinical situations. eGFR's persistently <60 mL/min signify possible Chronic Kidney Disease.    Anion gap 8 5 - 15  CBC     Status: Abnormal   Collection Time: 03/21/16  3:07 PM  Result Value Ref Range   WBC 8.1 3.6 - 11.0 K/uL   RBC 5.60 (H) 3.80 - 5.20 MIL/uL   Hemoglobin 14.5 12.0 - 16.0 g/dL   HCT 43.6 35.0 - 47.0 %   MCV 77.9 (L) 80.0 - 100.0 fL   MCH 26.0 26.0 - 34.0 pg   MCHC 33.4 32.0 - 36.0 g/dL   RDW 25.1 (H) 11.5 - 14.5 %   Platelets 245 150 - 440 K/uL  Protime-INR     Status: Abnormal   Collection Time: 03/21/16  3:07 PM    Result Value Ref Range   Prothrombin Time 61.6 (H) 11.4 - 15.2 seconds   INR 6.86 (HH)     Comment: CRITICAL RESULT CALLED TO, READ BACK BY AND VERIFIED WITH: BILL SMITH 03/21/16 [place]     Radiology Ct Thoracic Spine Wo Contrast  Result Date: 03/18/2016 CLINICAL DATA:  81 year old female with thoracic compression fractures. Mid thoracic pain. Subsequent encounter. EXAM: CT THORACIC SPINE WITHOUT CONTRAST TECHNIQUE: Multidetector CT imaging of the thoracic spine was performed without intravenous contrast administration. Multiplanar CT image reconstructions were also generated. COMPARISON:  Report of [facility] thoracic radiographs 02/21/2016 (no images available). Chest radiographs 10/10/2015 and earlier. FINDINGS: Thoracic segmentation appears to be normal.  Diffuse osteopenia. Alignment: Relatively preserved thoracic kyphosis. Moderate to severe T4 and T6 compression fractures which are partially sclerotic, especially the former. Loss of height at these levels is up to 50%. Mild retropulsion of bone at both levels with maintained spinal canal AP dimension of 10 mm or greater. Mild compression of the T5, T9 and T10 vertebral bodies, primarily affecting the superior endplates. Loss of height is more pronounced at T9 and up to 35%. Only minimal retropulsion of bone at these levels. Small chronic appearing endplate deformities adjacent to the T1-T2 disc which may be Schmorl nodes. Vertebrae: Other thoracic levels appear intact. No displaced posterior rib fracture identified. Paraspinal and other soft tissues: Visualized major airways are patent. There is respiratory motion artifact and atelectasis at the lung bases greater on the left. No pleural effusion. Calcified aortic atherosclerosis. Calcified coronary artery atherosclerosis. Partially visible cardiomegaly with prosthetic mitral valve. Negative visualized upper abdominal viscera. Negative visualized posterior paraspinal soft tissues. Disc  levels: Mild for age lumbar spine degeneration. No CT evidence of thoracic spinal stenosis. IMPRESSION: 1. Mild age indeterminate compression fractures of T5, T9 and T10. Moderate to severe more chronic appearing compression fractures of T4 and T6. If specific therapy such as vertebroplasty  is desired, Thoracic MRI or Nuclear Medicine Whole-body Bone Scan would best determine acuity. 2. Osteopenia. No other acute osseous abnormality identified in the thoracic spine. 3. Mild for age superimposed thoracic spine degeneration. No spinal stenosis. 4. Calcified coronary artery and Calcified aortic atherosclerosis. Electronically Signed   By: Genevie Ann M.D.   On: 03/18/2016 15:13   Ct Lumbar Spine Wo Contrast  Result Date: 03/18/2016 CLINICAL DATA:  81 year old female with thoracic compression fractures. Mid thoracic pain. Subsequent encounter. EXAM: CT LUMBAR SPINE WITHOUT CONTRAST TECHNIQUE: Multidetector CT imaging of the lumbar spine was performed without intravenous contrast administration. Multiplanar CT image reconstructions were also generated. COMPARISON:  Thoracic spine CT from today reported separately. CT Abdomen and Pelvis 03/22/2012. FINDINGS: Segmentation: In conjunction with the thoracic spine CT today reported separately lumbar segmentation is normal. Alignment: Moderate to severe compression fracture of L4 with loss of height up to or slightly greater than 50%. Mild compression fractures of L2, L3, and L5. Of these, the L4 and L5 fractures are most sclerotic. Superimposed grade 1 anterolisthesis of L3 on L4 and L4 on L5. No pars fractures. Severe superimposed lumbar facet degeneration from L2 to the sacrum. Vertebrae: T12 and L1 intact. Osteopenia. Abnormal sclerosis and periosteal reaction along the right sacral ala in keeping with insufficiency fracture (series 4, image 105). Small left superior sacral ala insufficiency fracture which is less apparent and might be more acute (series 4, image 96). SI  joints remain normal. Paraspinal and other soft tissues: Calcified aortic atherosclerosis. Chronic cholelithiasis. Individual gallstones up to nearly 3 cm diameter. No acute posterior paraspinal soft tissue findings. Disc levels: Mild multifactorial spinal stenosis at L3-L4. IMPRESSION: 1. Bilateral sacral insufficiency fractures, more extensive on the right but possibly more acute on the left. 2. Lumbar compression fractures of L2 through L5, also age indeterminate. 3. In light of these findings as well as the thoracic CT findings today (reported separately) recommend Nuclear Medicine Whole-body Bone Scan to evaluate the acuity of these fractures further. 4. Superimposed degenerative spine of lumbar spondylolisthesis with widespread severe facet arthropathy. Mild multifactorial spinal stenosis at L3-L4. 5.  Calcified aortic atherosclerosis. 6. Bulky cholelithiasis. Electronically Signed   By: Genevie Ann M.D.   On: 03/18/2016 15:24   Mr Thoracic Spine Wo Contrast  Result Date: 03/21/2016 CLINICAL DATA:  Pain from the neck to the waist and bilateral hip pain for 2 months since a fall. EXAM: MRI THORACIC SPINE WITHOUT CONTRAST TECHNIQUE: Multiplanar, multisequence MR imaging of the thoracic spine was performed. No intravenous contrast was administered. COMPARISON:  CT thoracic spine 03/18/2016. FINDINGS: Alignment: No listhesis. Mild exaggeration of thoracic kyphosis centered about T4 is noted. Vertebrae: Severe T4 and T6 compression fractures are identified with associated marrow edema, more notable in T6. Vertebral body height loss of T4-T6 is estimated at up to 80-90% There is also marrow edema in the superior endplate of T7 without compression compatible with microfracturing. Remote compression fractures of T5, T9 and T10 are identified. Cord:  Normal signal throughout. Paraspinal and other soft tissues: Unremarkable. Disc levels: A few small disc protrusions are identified without central canal stenosis. Mild  bony retropulsion off the mid aspect of T4 and T6 is also seen but does not cause central canal or foraminal stenosis. IMPRESSION: Acute or subacute compression fractures of T4 and T6 with marked vertebral body height loss at both levels. Mild bony retropulsion is identified at both levels without causing central canal or foraminal stenosis. Marrow edema in the superior  endplate of T7 without vertebral body height loss is compatible with microfracture. Remote compression fractures at T5, T9 and T10. Electronically Signed   By: Inge Rise M.D.   On: 03/21/2016 10:42   Mr Lumbar Spine Wo Contrast  Result Date: 03/21/2016 CLINICAL DATA:  Back pain from the neck to the waist with bilateral hip pain. History of fall 2 months ago. EXAM: MRI LUMBAR SPINE WITHOUT CONTRAST TECHNIQUE: Multiplanar, multisequence MR imaging of the lumbar spine was performed. No intravenous contrast was administered. COMPARISON:  CT lumbar spine 03/18/2016. FINDINGS: Segmentation:  Unremarkable. Alignment: Facet degenerative disease results in 0.5 cm anterolisthesis L3 on L4. Vertebrae: Biconcave compression fracture of L3 is identified with marrow edema in the inferior endplate. Vertebral body height loss of up to 30% centrally is identified. There is also a biconcave compression fracture of L4 with marrow edema in the inferior endplate. Vertebral body height loss is estimated at 60%. Marrow edema in the superior endplate of L5 is seen where there is a mild compression fracture with vertebral body height loss of approximately 20%. Remote L3 compression fracture is identified. There is marrow edema in the right sacrum consistent with insufficiency fracture. Conus medullaris: Extends to the T12-L1 level and appears normal. Paraspinal and other soft tissues: Multiple gallstones are identified. Largest stone measures 3 cm in diameter. Disc levels: T12-L1:  Negative. L1-2: Minimal disc bulge without central canal or foraminal stenosis.  L2-3: Ligamentum flavum thickening and a shallow disc bulge are identified. The central canal and foramina remain open. L3-4: The disc is uncovered with a shallow bulge. There is facet arthropathy and some ligamentum flavum thickening. The central spinal canal and foramina remain open. L4-5: Minimal disc bulge is seen and there is facet arthropathy. Mild subarticular recess narrowing on the left is identified without nerve root compression. The foramina are open. Marked marrow edema is present in the left L5 pedicle where there is likely a nondisplaced fracture. Mild marrow edema in the right L4 and L5 pedicles is compatible with stress change without fracture. L5-S1: Partial autologous fusion across the disc interspace is identified. No bulge or protrusion. The central canal and foramina are open. IMPRESSION: Acute or subacute compression fractures of the inferior endplates of L3 and L4 and superior endplate of L5. Intense marrow edema in the left L5 pedicle is present where there is likely nondisplaced fracture present. Acute or subacute right sacral fracture is also identified. Remote L3 compression fracture. Overall mild degenerative disease without central canal or foraminal stenosis. Mild subarticular recess narrowing on the left at L4-5 without nerve root compression is noted. Gallstones. Electronically Signed   By: Inge Rise M.D.   On: 03/21/2016 10:33   US Venous Img Lower Unilateral Left  Result Date: 03/21/2016 CLINICAL DATA:  Left leg swelling for 6 day EXAM: LEFT LOWER EXTREMITY VENOUS DUPLEX ULTRASOUND TECHNIQUE: Doppler venous assessment of the left lower extremity deep venous system was performed, including characterization of spectral flow, compressibility, and phasicity. COMPARISON:  None. FINDINGS: There is complete compressibility of the left common femoral, femoral, and popliteal veins. Doppler analysis demonstrates respiratory phasicity and augmentation of flow with calf compression.  No obvious superficial vein or calf vein thrombosis. IMPRESSION: No evidence of left lower extremity DVT. Electronically Signed   By: Marybelle Killings M.D.   On: 03/21/2016 16:24    Assessment/Plan  1. Venous insufficiency:  Patient will continue with the compression wraps. Once again I discussed elevation as well. Possibility of a lymph pump was  also reviewed. Patient does not wish to proceed with a lymph pump at this time.she will follow up in 6 months no studies.  2.  Lymphedema:  As noted above discussion regarding a lymph pump was entertained with the patient will not proceed with this therapy at this time.  3. Ecchymoses of the extremities:  I spoke with the patient extensively these are undoubtedly secondary to her Coumadin which is required given her mechanical heart valve. I have reassured her but I have also been quite insistent that she must maintain a therapeutic INR.  4.  Multiple compression fractures of the spine:  Patient is to follow up with the spine service. It does appear that there will be intervention performed. Given this fact she will need to be off her Coumadin and in light of her mechanical heart valve she will need to be bridged with Lovenox. I therefore provided a prescription for her Lovenox so that she may move forward with her treatment of the spine.   Hortencia Pilar, MD  04/04/2016 4:31 PM    This note was created with Dragon medical transcription system.  Any errors from dictation are purely unintentional

## 2016-04-04 NOTE — Progress Notes (Signed)
CROSBY, BEVAN ([id number]) Visit Report for 04/03/2016 Arrival Information Details Patient Name: MARGET, OUTTEN. Date of Service: 04/03/2016 2:15 PM Medical Record Number: [id number] Patient Account Number: [id number] Date of Birth/Sex: [birth year] (81 y.o. Female) Treating RN: Afful, RN, BSN, Velva Harman Primary Care Physician: [name] Other Clinician: Referring Physician: [name] Treating Physician/Extender: Melburn Hake, HOYT Weeks in Treatment: 26 Visit Information History Since Last Visit All ordered tests and consults were completed: No Patient Arrived: Wheel Chair Added or deleted any medications: No Arrival Time: 14:29 Any new allergies or adverse reactions: No Accompanied By: son Had a fall or experienced change in No Transfer Assistance: None activities of daily living that may affect Patient Identification Verified: Yes risk of falls: Secondary Verification Process Yes Signs or symptoms of abuse/neglect since last No Completed: visito Patient Requires Transmission- No Hospitalized since last visit: No Based Precautions: Has Dressing in Place as Prescribed: Yes Patient Has Alerts: Yes Has Compression in Place as Prescribed: Yes Patient Alerts: Patient on Blood Pain Present Now: Yes Thinner Warfarin,Non Compressible Silver Electronic Signature(s) Signed: 04/03/2016 5:28:42 PM By: Regan [name] BSN, RN Entered By: Regan [name] on 04/03/2016 14:31:08 Sage, [name] ([id number]) -------------------------------------------------------------------------------- Clinic Level of Care Assessment Details Patient Name: [name] Date of Service: 04/03/2016 2:15 PM Medical Record Number: [id number] Patient Account Number: [id number] Date of Birth/Sex: 02/12/1935 (81 y.o. Female) Treating RN: Afful, RN, BSN, Velva Harman Primary Care Physician: [name] Other Clinician: Referring Physician: [name] Treating Physician/Extender: Melburn Hake,  HOYT Weeks in Treatment: 19 Clinic Level of Care Assessment Items TOOL 4 Quantity Score []  - Use when only an EandM is performed on FOLLOW-UP visit 0 ASSESSMENTS - Nursing Assessment / Reassessment X - Reassessment of Co-morbidities (includes updates in patient status) 1 10 X - Reassessment of Adherence to Treatment Plan 1 5 ASSESSMENTS - Wound and Skin Assessment / Reassessment X - Simple Wound Assessment / Reassessment - one wound 1 5 []  - Complex Wound Assessment / Reassessment - multiple wounds 0 []  - Dermatologic / Skin Assessment (not related to wound area) 0 ASSESSMENTS - Focused Assessment []  - Circumferential Edema Measurements - multi extremities 0 []  - Nutritional Assessment / Counseling / Intervention 0 X - Lower Extremity Assessment (monofilament, tuning fork, pulses) 1 5 []  - Peripheral Arterial Disease Assessment (using hand held doppler) 0 ASSESSMENTS - Ostomy and/or Continence Assessment and Care []  - Incontinence Assessment and Management 0 []  - Ostomy Care Assessment and Management (repouching, etc.) 0 PROCESS - Coordination of Care X - Simple Patient / Family Education for ongoing care 1 15 []  - Complex (extensive) Patient / Family Education for ongoing care 0 []  - Staff obtains Programmer, systems, Records, Test Results / Process Orders 0 []  - Staff telephones HHA, Nursing Homes / Clarify orders / etc 0 []  - Routine Transfer to another Facility (non-emergent condition) 0 Mcilrath, [name] ([id number]) []  - Routine Hospital Admission (non-emergent condition) 0 []  - New Admissions / Biomedical engineer / Ordering NPWT, Apligraf, etc. 0 []  - Emergency Hospital Admission (emergent condition) 0 []  - Simple Discharge Coordination 0 []  - Complex (extensive) Discharge Coordination 0 PROCESS - Special Needs []  - Pediatric / Minor Patient Management 0 []  - Isolation Patient Management 0 []  - Hearing / Language / Visual special needs 0 []  - Assessment of Community assistance  (transportation, D/C planning, etc.) 0 []  - Additional assistance / Altered mentation 0 []  - Support Surface(s) Assessment (bed, cushion, seat, etc.) 0 INTERVENTIONS - Wound Cleansing / Measurement  X - Simple Wound Cleansing - one wound 1 5 []  - Complex Wound Cleansing - multiple wounds 0 X - Wound Imaging (photographs - any number of wounds) 1 5 []  - Wound Tracing (instead of photographs) 0 X - Simple Wound Measurement - one wound 1 5 []  - Complex Wound Measurement - multiple wounds 0 INTERVENTIONS - Wound Dressings X - Small Wound Dressing one or multiple wounds 1 10 []  - Medium Wound Dressing one or multiple wounds 0 []  - Large Wound Dressing one or multiple wounds 0 []  - Application of Medications - topical 0 []  - Application of Medications - injection 0 INTERVENTIONS - Miscellaneous []  - External ear exam 0 Tess, Patina C. ([id number]) []  - Specimen Collection (cultures, biopsies, blood, body fluids, etc.) 0 []  - Specimen(s) / Culture(s) sent or taken to Lab for analysis 0 []  - Patient Transfer (multiple staff / Harrel Lemon Lift / Similar devices) 0 []  - Simple Staple / Suture removal (25 or less) 0 []  - Complex Staple / Suture removal (26 or more) 0 []  - Hypo / Hyperglycemic Management (close monitor of Blood Glucose) 0 []  - Ankle / Brachial Index (ABI) - do not check if billed separately 0 X - Vital Signs 1 5 Has the patient been seen at the hospital within the last three years: Yes Total Score: 70 Level Of Care: New/Established - Level 2 Electronic Signature(s) Signed: 04/03/2016 5:28:42 PM By: Regan [name] BSN, RN Entered By: Regan [name] on 04/03/2016 14:52:44 Melito, [name] ([id number]) -------------------------------------------------------------------------------- Encounter Discharge Information Details Patient Name: [name] Date of Service: 04/03/2016 2:15 PM Medical Record Number: [id number] Patient Account Number: [id number] Date of Birth/Sex:  [birth year] (81 y.o. Female) Treating RN: Baruch Gouty, RN, BSN, Velva Harman Primary Care Physician: [name] Other Clinician: Referring Physician: [name] Treating Physician/Extender: Melburn Hake, HOYT Weeks in Treatment: 59 Encounter Discharge Information Items Discharge Pain Level: 0 Discharge Condition: Stable Ambulatory Status: Wheelchair Discharge Destination: Home Transportation: Private Auto Accompanied By: son Schedule Follow-up Appointment: No Medication Reconciliation completed and provided to Patient/Care No Cesiah Westley: Provided on Clinical Summary of Care: 04/03/2016 Form Type Recipient Paper Patient MS Electronic Signature(s) Signed: 04/03/2016 3:06:34 PM By: Ruthine Dose Entered By: Ruthine Dose on 04/03/2016 15:06:33 Cibolo, [name] ([id number]) -------------------------------------------------------------------------------- Lower Extremity Assessment Details Patient Name: [name], Daphnee C. Date of Service: 04/03/2016 2:15 PM Medical Record Number: [id number] Patient Account Number: [id number] Date of Birth/Sex: 1934/12/11 (81 y.o. Female) Treating RN: Afful, RN, BSN, Velva Harman Primary Care Physician: [name] Other Clinician: Referring Physician: [name] Treating Physician/Extender: Melburn Hake, HOYT Weeks in Treatment: 19 Edema Assessment Assessed: [Left: No] [Right: No] Edema: [Left: Ye] [Right: s] Calf Left: Right: Point of Measurement: 36 cm From Medial Instep cm 34 cm Ankle Left: Right: Point of Measurement: 10 cm From Medial Instep cm 20 cm Vascular Assessment Claudication: Claudication Assessment [Right:None] Pulses: Posterior Tibial Dorsalis Pedis Palpable: [Right:Yes] Extremity colors, hair growth, and conditions: Extremity Color: [Right:Mottled] Hair Growth on Extremity: [Right:No] Temperature of Extremity: [Right:Warm] Capillary Refill: [Right:< 3 seconds] Toe Nail Assessment Left: Right: Thick: Yes Discolored: Yes Deformed:  No Improper Length and Hygiene: No Electronic Signature(s) Signed: 04/03/2016 5:28:42 PM By: Regan [name] BSN, RN Entered By: Regan [name] on 04/03/2016 14:40:02 Carre, [name] ([id number]) Bohan, Riyanshi CMarland Kitchen ([id number]) -------------------------------------------------------------------------------- Multi Wound Chart Details Patient Name: [name], Irys C. Date of Service: 04/03/2016 2:15 PM Medical Record Number: [id number] Patient Account Number: [id number] Date of Birth/Sex: 06-07-1935 (81 y.o. Female) Treating RN: Afful, RN, BSN, [employer]  Primary Care Physician: [name] Other Clinician: Referring Physician: [name] Treating Physician/Extender: Melburn Hake, HOYT Weeks in Treatment: 19 Vital Signs Height(in): 68 Pulse(bpm): 66 Weight(lbs): 178 Blood Pressure 125/62 (mmHg): Body Mass Index(BMI): 27 Temperature(F): Respiratory Rate 17 (breaths/min): Photos: [4:No Photos] [N/A:N/A] Wound Location: [4:Right, Proximal Lower Leg N/A] Wounding Event: [4:Shear/Friction] [N/A:N/A] Primary Etiology: [4:Skin Tear] [N/A:N/A] Comorbid History: [4:Cataracts, Arrhythmia, Hypotension, Osteoarthritis] [N/A:N/A] Date Acquired: [4:02/28/2016] [N/A:N/A] Weeks of Treatment: [4:5] [N/A:N/A] Wound Status: [4:Open] [N/A:N/A] Measurements L x W x D 0.2x0.2x0.1 [N/A:N/A] (cm) Area (cm) : [4:0.031] [N/A:N/A] Volume (cm) : [4:0.003] [N/A:N/A] % Reduction in Area: [4:56.30%] [N/A:N/A] % Reduction in Volume: 57.10% [N/A:N/A] Classification: [4:Partial Thickness] [N/A:N/A] Exudate Amount: [4:Small] [N/A:N/A] Exudate Type: [4:Sanguinous] [N/A:N/A] Exudate Color: [4:red] [N/A:N/A] Wound Margin: [4:Distinct, outline attached N/A] Granulation Amount: [4:Large (67-100%)] [N/A:N/A] Necrotic Amount: [4:None Present (0%)] [N/A:N/A] Exposed Structures: [4:Fascia: No Fat: No Tendon: No Muscle: No Joint: No Bone: No] [N/A:N/A] Limited to Skin Breakdown Epithelialization: Large (67-100%)  N/A N/A Periwound Skin Texture: Edema: Yes N/A N/A Excoriation: No Induration: No Callus: No Crepitus: No Fluctuance: No Friable: No Rash: No Scarring: No Periwound Skin Moist: Yes N/A N/A Moisture: Maceration: No Dry/Scaly: No Periwound Skin Color: Mottled: Yes N/A N/A Atrophie Blanche: No Cyanosis: No Ecchymosis: No Erythema: No Hemosiderin Staining: No Pallor: No Rubor: No Temperature: No Abnormality N/A N/A Tenderness on Yes N/A N/A Palpation: Wound Preparation: Ulcer Cleansing: Other: N/A N/A soap and water Topical Anesthetic Applied: Other: lidocaine 4% Treatment Notes Electronic Signature(s) Signed: 04/03/2016 5:28:42 PM By: Regan [name] BSN, RN Entered By: Regan [name] on 04/03/2016 14:50:02 [name] ([id number]) -------------------------------------------------------------------------------- Lilly Details Patient Name: JULIYA, MAGILL C. Date of Service: 04/03/2016 2:15 PM Medical Record Number: [id number] Patient Account Number: [id number] Date of Birth/Sex: Apr 19, 1935 (81 y.o. Female) Treating RN: Afful, RN, BSN, Velva Harman Primary Care Physician: [name] Other Clinician: Referring Physician: [name] Treating Physician/Extender: Melburn Hake, HOYT Weeks in Treatment: 39 Active Inactive Orientation to the Wound Care Program Nursing Diagnoses: Knowledge deficit related to the wound healing center program Goals: Patient/caregiver will verbalize understanding of the [place] Program Date Initiated: 11/22/2015 Goal Status: Active Interventions: Provide education on orientation to the wound center Notes: Venous Leg Ulcer Nursing Diagnoses: Knowledge deficit related to disease process and management Potential for venous Insuffiency (use before diagnosis confirmed) Goals: Patient will maintain optimal edema control Date Initiated: 11/22/2015 Goal Status: Active Patient/caregiver will verbalize  understanding of disease process and disease management Date Initiated: 11/22/2015 Goal Status: Active Verify adequate tissue perfusion prior to therapeutic compression application Date Initiated: 11/22/2015 Goal Status: Active Interventions: Assess peripheral edema status every visit. Compression as ordered Provide education on venous insufficiency CYDNI, REDDOCH ([id number]) Treatment Activities: Non-invasive vascular studies : 11/22/2015 Therapeutic compression applied : 11/22/2015 Notes: Wound/Skin Impairment Nursing Diagnoses: Impaired tissue integrity Knowledge deficit related to ulceration/compromised skin integrity Goals: Patient/caregiver will verbalize understanding of skin care regimen Date Initiated: 11/22/2015 Goal Status: Active Ulcer/skin breakdown will have a volume reduction of 30% by week 4 Date Initiated: 11/22/2015 Goal Status: Active Ulcer/skin breakdown will have a volume reduction of 50% by week 8 Date Initiated: 11/22/2015 Goal Status: Active Ulcer/skin breakdown will have a volume reduction of 80% by week 12 Date Initiated: 11/22/2015 Goal Status: Active Ulcer/skin breakdown will heal within 14 weeks Date Initiated: 11/22/2015 Goal Status: Active Interventions: Assess patient/caregiver ability to perform ulcer/skin care regimen upon admission and as needed Assess ulceration(s) every visit Provide education on ulcer and skin care  Treatment Activities: Skin care regimen initiated : 11/22/2015 Topical wound management initiated : 11/22/2015 Notes: Electronic Signature(s) Signed: 04/03/2016 5:28:42 PM By: Regan [name] BSN, RN Entered By: Regan [name] on 04/03/2016 14:49:45 Shams, ETTER ROYALL ([id number]) Quiroa, Monalisa Loletha Grayer ([id number]) -------------------------------------------------------------------------------- Pain Assessment Details Patient Name: [name] Date of Service: 04/03/2016 2:15 PM Medical Record Number: [id number] Patient  Account Number: [id number] Date of Birth/Sex: [name] 22, 1935 (81 y.o. Female) Treating RN: Baruch Gouty, RN, BSN, Velva Harman Primary Care Physician: [name] Other Clinician: Referring Physician: [name] Treating Physician/Extender: Melburn Hake, HOYT Weeks in Treatment: 62 Active Problems Location of Pain Severity and Description of Pain Patient Has Paino No Site Locations With Dressing Change: No Pain Management and Medication Current Pain Management: Electronic Signature(s) Signed: 04/03/2016 5:28:42 PM By: Regan [name] BSN, RN Entered By: Regan [name] on 04/03/2016 14:31:20 Morden, [name] ([id number]) -------------------------------------------------------------------------------- Patient/Caregiver Education Details Patient Name: [name] Date of Service: 04/03/2016 2:15 PM Medical Record Number: [id number] Patient Account Number: [id number] Date of Birth/Gender: September 26, 1934 (81 y.o. Female) Treating RN: Baruch Gouty, RN, BSN, Velva Harman Primary Care Physician: [name] Other Clinician: Referring Physician: [name] Treating Physician/Extender: Melburn Hake, HOYT Weeks in Treatment: 73 Education Assessment Education Provided To: Patient Education Topics Provided Venous: Methods: Explain/Verbal Responses: State content correctly Welcome To [facility]: Methods: Explain/Verbal Responses: State content correctly Wound/Skin Impairment: Methods: Explain/Verbal Responses: State content correctly Electronic Signature(s) Signed: 04/03/2016 5:28:42 PM By: Regan [name] BSN, RN Entered By: Regan [name] on 04/03/2016 14:54:24 Beam, [name] ([id number]) -------------------------------------------------------------------------------- Wound Assessment Details Patient Name: [name] Date of Service: 04/03/2016 2:15 PM Medical Record Number: [id number] Patient Account Number: [id number] Date of Birth/Sex: 15-Sep-1934 (81 y.o. Female) Treating RN: Afful, RN, BSN,  [place] Primary Care Physician: [name] Other Clinician: Referring Physician: [name] Treating Physician/Extender: Melburn Hake, HOYT Weeks in Treatment: 19 Wound Status Wound Number: 4 Primary Skin Tear Etiology: Wound Location: Right, Proximal Lower Leg Wound Status: Open Wounding Event: Shear/Friction Comorbid Cataracts, Arrhythmia, Hypotension, Date Acquired: 02/28/2016 History: Osteoarthritis Weeks Of Treatment: 5 Clustered Wound: No Photos Photo Uploaded By: Regan [name] on 04/03/2016 17:26:58 Wound Measurements Length: (cm) 0.2 Width: (cm) 0.2 Depth: (cm) 0.1 Area: (cm) 0.031 Volume: (cm) 0.003 % Reduction in Area: 56.3% % Reduction in Volume: 57.1% Epithelialization: Large (67-100%) Tunneling: No Undermining: No Wound Description Classification: Partial Thickness Wound Margin: Distinct, outline attached Exudate Amount: Small Exudate Type: Sanguinous Exudate Color: red Rathman, Darlis C. ([id number]) Foul Odor After Cleansing: No Wound Bed Granulation Amount: Large (67-100%) Exposed Structure Necrotic Amount: None Present (0%) Fascia Exposed: No Fat Layer Exposed: No Tendon Exposed: No Muscle Exposed: No Joint Exposed: No Bone Exposed: No Limited to Skin Breakdown Periwound Skin Texture Texture Color No Abnormalities Noted: No No Abnormalities Noted: No Callus: No Atrophie Blanche: No Crepitus: No Cyanosis: No Excoriation: No Ecchymosis: No Fluctuance: No Erythema: No Friable: No Hemosiderin Staining: No Induration: No Mottled: Yes Localized Edema: Yes Pallor: No Rash: No Rubor: No Scarring: No Temperature / Pain Moisture Temperature: No Abnormality No Abnormalities Noted: No Tenderness on Palpation: Yes Dry / Scaly: No Maceration: No Moist: Yes Wound Preparation Ulcer Cleansing: Other: soap and water, Topical Anesthetic Applied: Other: lidocaine 4%, Treatment Notes Wound #4 (Right, Proximal Lower Leg) 1. Cleansed  with: Clean wound with Normal Saline 4. Dressing Applied: Prisma Ag 5. Secondary Dressing Applied Bordered Foam Dressing 7. Secured with Patient to wear own compression stockings Electronic Signature(s) Signed: 04/03/2016 5:28:42 PM By: Regan [name] BSN, RN Mcmeekin, Shahidah  Loletha Grayer ([id number]) Entered By: Regan [name] on 04/03/2016 14:49:12 Bruington, [name] ([id number]) -------------------------------------------------------------------------------- Vitals Details Patient Name: [name] Date of Service: 04/03/2016 2:15 PM Medical Record Number: [id number] Patient Account Number: [id number] Date of Birth/Sex: 01-19-1935 (81 y.o. Female) Treating RN: Afful, RN, BSN, [place] Primary Care Physician: [name] Other Clinician: Referring Physician: [name] Treating Physician/Extender: Melburn Hake, HOYT Weeks in Treatment: 19 Vital Signs Time Taken: 14:34 Pulse (bpm): 66 Height (in): 68 Respiratory Rate (breaths/min): 17 Weight (lbs): 178 Blood Pressure (mmHg): 125/62 Body Mass Index (BMI): 27.1 Reference Range: 80 - 120 mg / dl Electronic Signature(s) Signed: 04/03/2016 5:28:42 PM By: Regan [name] BSN, RN Entered By: Regan [name] on 04/03/2016 14:34:20

## 2016-04-05 NOTE — Progress Notes (Signed)
BERDENE, ASKARI ([id number]) Visit Report for 03/27/2016 Arrival Information Details Patient Name: CHALESE, [name]. Date of Service: 03/27/2016 3:00 PM Medical Record Patient Account Number: [id number] Number: Treating [name]: [name], BSN, [name] 1934-08-26 [phone] y.o. Other Clinician: Date of Birth/Sex: Female) Treating [name] Primary Care Physician/Extender: [name] Physician: Referring Physician: Melina [name] in Treatment: 18 Visit Information History Since Last Visit All ordered tests and consults were completed: No Patient Arrived: Wheel Chair Added or deleted any medications: No Arrival Time: 15:07 Any new allergies or adverse reactions: No Accompanied By: dtr Had a fall or experienced change in No Transfer Assistance: EasyPivot Patient activities of daily living that may affect Lift risk of falls: Patient Identification Verified: Yes Signs or symptoms of abuse/neglect since last No Secondary Verification Process Yes visito Completed: Has Dressing in Place as Prescribed: Yes Patient Requires Transmission- No Has Compression in Place as Prescribed: Yes Based Precautions: Pain Present Now: No Patient Has Alerts: Yes Patient Alerts: Patient on Blood Thinner [place] Signature(s) Signed: 03/27/2016 4:39:12 PM By: [name] BSN, [name] Entered By: [name] on 03/27/2016 15:08:40 [name], [name] ([id number]) -------------------------------------------------------------------------------- Clinic Level of Care Assessment Details Patient Name: [name]. Date of Service: 03/27/2016 3:00 PM Medical Record Patient Account Number: [id number] Number: Treating [name]: [name], BSN, [name] 11-Oct-1934 [phone] y.o. Other Clinician: Date of Birth/Sex: Female) Treating [name] Primary Care Physician/Extender: [name] Physician: Referring Physician: Melina [name] in  Treatment: 18 Clinic Level of Care Assessment Items TOOL 4 Quantity Score []  - Use when only an EandM is performed on FOLLOW-UP visit 0 ASSESSMENTS - Nursing Assessment / Reassessment X - Reassessment of Co-morbidities (includes updates in patient status) 1 10 X - Reassessment of Adherence to Treatment Plan 1 5 ASSESSMENTS - Wound and Skin Assessment / Reassessment X - Simple Wound Assessment / Reassessment - one wound 1 5 []  - Complex Wound Assessment / Reassessment - multiple wounds 0 []  - Dermatologic / Skin Assessment (not related to wound area) 0 ASSESSMENTS - Focused Assessment []  - Circumferential Edema Measurements - multi extremities 0 []  - Nutritional Assessment / Counseling / Intervention 0 X - Lower Extremity Assessment (monofilament, tuning fork, pulses) 1 5 []  - Peripheral Arterial Disease Assessment (using hand held doppler) 0 ASSESSMENTS - Ostomy and/or Continence Assessment and Care []  - Incontinence Assessment and Management 0 []  - Ostomy Care Assessment and Management (repouching, etc.) 0 PROCESS - Coordination of Care X - Simple Patient / Family Education for ongoing care 1 15 []  - Complex (extensive) Patient / Family Education for ongoing care 0 []  - Staff obtains Consents, Records, Test Results / Process Orders 0 [name], [name] ([id number]) []  - Staff telephones HHA, Nursing Homes / Clarify orders / etc 0 []  - Routine Transfer to another Facility (non-emergent condition) 0 []  - Routine Hospital Admission (non-emergent condition) 0 []  - New Admissions / Biomedical engineer / Ordering NPWT, Apligraf, etc. 0 []  - Emergency Hospital Admission (emergent condition) 0 []  - Simple Discharge Coordination 0 []  - Complex (extensive) Discharge Coordination 0 PROCESS - Special Needs []  - Pediatric / Minor Patient Management 0 []  - Isolation Patient Management 0 []  - Hearing / Language / Visual special needs 0 []  - Assessment of Community assistance  (transportation, D/C planning, etc.) 0 []  - Additional assistance / Altered mentation 0 []  - Support Surface(s) Assessment (bed, cushion, seat, etc.) 0 INTERVENTIONS - Wound Cleansing / Measurement X - Simple  Wound Cleansing - one wound 1 5 []  - Complex Wound Cleansing - multiple wounds 0 X - Wound Imaging (photographs - any number of wounds) 1 5 []  - Wound Tracing (instead of photographs) 0 X - Simple Wound Measurement - one wound 1 5 []  - Complex Wound Measurement - multiple wounds 0 INTERVENTIONS - Wound Dressings X - Small Wound Dressing one or multiple wounds 1 10 []  - Medium Wound Dressing one or multiple wounds 0 []  - Large Wound Dressing one or multiple wounds 0 []  - Application of Medications - topical 0 []  - Application of Medications - injection 0 [name], [name] ([id number]) INTERVENTIONS - Miscellaneous []  - External ear exam 0 []  - Specimen Collection (cultures, biopsies, blood, body fluids, etc.) 0 []  - Specimen(s) / Culture(s) sent or taken to Lab for analysis 0 []  - Patient Transfer (multiple staff / Harrel Lemon Lift / Similar devices) 0 []  - Simple Staple / Suture removal (25 or less) 0 []  - Complex Staple / Suture removal (26 or more) 0 []  - Hypo / Hyperglycemic Management (close monitor of Blood Glucose) 0 []  - Ankle / Brachial Index (ABI) - do not check if billed separately 0 X - Vital Signs 1 5 Has the patient been seen at the hospital within the last three years: Yes Total Score: 70 Level Of Care: New/Established - Level 2 Electronic Signature(s) Signed: 03/27/2016 4:39:12 PM By: [name] BSN, [name] Entered By: [name] on 03/27/2016 16:37:06 [name], [name] ([id number]) -------------------------------------------------------------------------------- Encounter Discharge Information Details Patient Name: Carrie Mulligan C. Date of Service: 03/27/2016 3:00 PM Medical Record Patient Account Number: [id number] Number: Treating [name]: [name],  BSN, [name] [birth year] [phone] y.o. Other Clinician: Date of Birth/Sex: Female) Treating [name] Primary Care Physician/Extender: [name] Physician: Referring Physician: Melina [name] in Treatment: 18 Encounter Discharge Information Items Discharge Pain Level: 0 Discharge Condition: Stable Ambulatory Status: Wheelchair Discharge Destination: Home Transportation: Private Auto Accompanied By: dtr Schedule Follow-up Appointment: No Medication Reconciliation completed and provided to Patient/Care No Aarron Wierzbicki: Provided on Clinical Summary of Care: 03/27/2016 Form Type Recipient Paper Patient [place] Electronic Signature(s) Signed: 03/27/2016 4:35:40 PM By: [name] BSN, [name] Previous Signature: 03/27/2016 4:07:25 PM Version By: Ruthine Dose Entered By: [name] on 03/27/2016 16:35:40 Litzenberger, [name] ([id number]) -------------------------------------------------------------------------------- General Visit Notes Details Patient Name: Carrie Mulligan C. Date of Service: 03/27/2016 3:00 PM Medical Record Patient Account Number: [id number] Number: Treating [name]: [name], BSN, [name] 12-May-1935 [phone] y.o. Other Clinician: Date of Birth/Sex: Female) Treating [name] Primary Care Physician/Extender: [name] Physician: Referring Physician: Melina [name] in Treatment: 18 Notes Right leg swelling improved . Cold to touch noted bruised, ecchymotic areas. 2nd and 3rd toe bruised looking. MD aware and assessed. Will bilaterally wrap her legs for support. Electronic Signature(s) Signed: 03/27/2016 4:39:12 PM By: [name] BSN, [name] Entered By: [name] on 03/27/2016 15:48:19 Hennick, [name] ([id number]) -------------------------------------------------------------------------------- Lower Extremity Assessment Details Patient Name: Carrie Mulligan C. Date of Service: 03/27/2016 3:00 PM Medical Record Patient Account Number:  [id number] Number: Treating [name]: [name], BSN, [name] 09-28-1934 [phone] y.o. Other Clinician: Date of Birth/Sex: Female) Treating [name] Primary Care Physician/Extender: [name] Physician: Referring Physician: Melina [name] in Treatment: 18 Edema Assessment Assessed: [Left: No] [Right: No] E[Left: dema] [Right: :] Calf Left: Right: Point of Measurement: 36 cm From Medial Instep cm 31.8 cm Ankle Left: Right: Point of Measurement: 10 cm From Medial Instep cm 20.3  cm Vascular Assessment Claudication: Claudication Assessment [Right:None] Pulses: Posterior Tibial Dorsalis Pedis Palpable: [Right:Yes] Extremity colors, hair growth, and conditions: Extremity Color: [Right:Mottled] Hair Growth on Extremity: [Right:No] Temperature of Extremity: [Right:Warm] Capillary Refill: [Right:< 3 seconds] Electronic Signature(s) Signed: 03/27/2016 4:39:12 PM By: [name] BSN, [name] Entered By: [name] on 03/27/2016 15:09:40 Zunker, [name] ([id number]) -------------------------------------------------------------------------------- Multi Wound Chart Details Patient Name: Carrie Mulligan C. Date of Service: 03/27/2016 3:00 PM Medical Record Patient Account Number: [id number] Number: Treating [name]: [name], BSN, [name] Nov 10, 1934 [phone] y.o. Other Clinician: Date of Birth/Sex: Female) Treating [name] Primary Care Physician/Extender: [name] Physician: Referring Physician: Melina [name] in Treatment: 18 Vital Signs Height(in): 68 Pulse(bpm): 65 Weight(lbs): 178 Blood Pressure 136/66 (mmHg): Body Mass Index(BMI): 27 Temperature(F): 97.7 Respiratory Rate 17 (breaths/min): Photos: [4:No Photos] [5:No Photos] [N/A:N/A] Wound Location: [4:Right Lower Leg - Proximal] [5:Right Lower Leg - Distal] [N/A:N/A] Wounding Event: [4:Shear/Friction] [5:Shear/Friction] [N/A:N/A] Primary Etiology: [4:Skin Tear] [5:Skin Tear]  [N/A:N/A] Comorbid History: [4:Cataracts, Arrhythmia, Hypotension, Osteoarthritis] [5:Cataracts, Arrhythmia, Hypotension, Osteoarthritis] [N/A:N/A] Date Acquired: [4:02/28/2016] [5:02/28/2016] [N/A:N/A] Weeks of Treatment: [4:4] [5:4] [N/A:N/A] Wound Status: [4:Open] [5:Healed - Epithelialized] [N/A:N/A] Measurements L x W x D 0.4x0.4x0.1 [5:0x0x0] [N/A:N/A] (cm) Area (cm) : [4:0.126] [5:0] [N/A:N/A] Volume (cm) : [4:0.013] [5:0] [N/A:N/A] % Reduction in Area: [4:-77.50%] [5:100.00%] [N/A:N/A] % Reduction in Volume: -85.70% [5:100.00%] [N/A:N/A] Classification: [4:Partial Thickness] [5:Partial Thickness] [N/A:N/A] Exudate Amount: [4:Small] [5:None Present] [N/A:N/A] Exudate Type: [4:Sanguinous] [5:N/A] [N/A:N/A] Exudate Color: [4:red] [5:N/A] [N/A:N/A] Wound Margin: [4:Distinct, outline attached] [5:Distinct, outline attached] [N/A:N/A] Granulation Amount: [4:Large (67-100%)] [5:None Present (0%)] [N/A:N/A] Necrotic Amount: [4:None Present (0%)] [5:None Present (0%)] [N/A:N/A] Exposed Structures: [4:Fascia: No Fat: No Tendon: No] [5:Fascia: No Fat: No Tendon: No] [N/A:N/A] Muscle: No Muscle: No Joint: No Joint: No Bone: No Bone: No Limited to Skin Limited to Skin Breakdown Breakdown Epithelialization: Large (67-100%) Large (67-100%) N/A Periwound Skin Texture: Edema: Yes Edema: Yes N/A Excoriation: No Excoriation: No Induration: No Induration: No Callus: No Callus: No Crepitus: No Crepitus: No Fluctuance: No Fluctuance: No Friable: No Friable: No Rash: No Rash: No Scarring: No Scarring: No Periwound Skin Moist: Yes Dry/Scaly: Yes N/A Moisture: Maceration: No Maceration: No Dry/Scaly: No Moist: No Periwound Skin Color: Mottled: Yes Ecchymosis: Yes N/A Atrophie Blanche: No Mottled: Yes Cyanosis: No Atrophie Blanche: No Ecchymosis: No Cyanosis: No Erythema: No Erythema: No Hemosiderin Staining: No Hemosiderin Staining: No Pallor: No Pallor: No Rubor:  No Rubor: No Temperature: No Abnormality No Abnormality N/A Tenderness on Yes No N/A Palpation: Wound Preparation: Ulcer Cleansing: Other: Ulcer Cleansing: Other: N/A soap and water soap and water Topical Anesthetic Topical Anesthetic Applied: Other: lidocaine Applied: None 4% Treatment Notes Electronic Signature(s) Signed: 03/27/2016 4:39:12 PM By: [name] BSN, [name] Entered By: [name] on 03/27/2016 15:42:58 Nary, [name] ([id number]) -------------------------------------------------------------------------------- [facility] Details Patient Name: [name], [name] C. Date of Service: 03/27/2016 3:00 PM Medical Record Patient Account Number: [id number] Number: Treating [name]: [name], BSN, [name] 1934/12/15 [phone] y.o. Other Clinician: Date of Birth/Sex: Female) Treating [name] Primary Care Physician/Extender: [name] Physician: Referring Physician: Melina [name] in Treatment: 57 Active Inactive Orientation to the Wound Care Program Nursing Diagnoses: Knowledge deficit related to the wound healing center program Goals: Patient/caregiver will verbalize understanding of the [facility] Program Date Initiated: 11/22/2015 Goal Status: Active Interventions: Provide education on orientation to the wound center Notes: Venous Leg Ulcer Nursing Diagnoses: Knowledge deficit related to disease process and management Potential for  venous Insuffiency (use before diagnosis confirmed) Goals: Patient will maintain optimal edema control Date Initiated: 11/22/2015 Goal Status: Active Patient/caregiver will verbalize understanding of disease process and disease management Date Initiated: 11/22/2015 Goal Status: Active Verify adequate tissue perfusion prior to therapeutic compression application Date Initiated: 11/22/2015 Goal Status: Active Interventions: Assess peripheral edema status every visit. [name], [name]  ([id number]) Compression as ordered Provide education on venous insufficiency Treatment Activities: Non-invasive vascular studies : 11/22/2015 Therapeutic compression applied : 11/22/2015 Notes: Wound/Skin Impairment Nursing Diagnoses: Impaired tissue integrity Knowledge deficit related to ulceration/compromised skin integrity Goals: Patient/caregiver will verbalize understanding of skin care regimen Date Initiated: 11/22/2015 Goal Status: Active Ulcer/skin breakdown will have a volume reduction of 30% by week 4 Date Initiated: 11/22/2015 Goal Status: Active Ulcer/skin breakdown will have a volume reduction of 50% by week 8 Date Initiated: 11/22/2015 Goal Status: Active Ulcer/skin breakdown will have a volume reduction of 80% by week 12 Date Initiated: 11/22/2015 Goal Status: Active Ulcer/skin breakdown will heal within 14 weeks Date Initiated: 11/22/2015 Goal Status: Active Interventions: Assess patient/caregiver ability to perform ulcer/skin care regimen upon admission and as needed Assess ulceration(s) every visit Provide education on ulcer and skin care Treatment Activities: Skin care regimen initiated : 11/22/2015 Topical wound management initiated : 11/22/2015 Notes: Electronic Signature(s) Signed: 03/27/2016 4:39:12 PM By: [name] BSN, [name] Timmins, Carrie CMarland Kitchen ([id number]) Entered By: [name] on 03/27/2016 15:23:35 Wilmott, [name] ([id number]) -------------------------------------------------------------------------------- Pain Assessment Details Patient Name: Carrie Mulligan C. Date of Service: 03/27/2016 3:00 PM Medical Record Patient Account Number: [id number] Number: Treating [name]: [name], BSN, [name] [birth year] [phone] y.o. Other Clinician: Date of Birth/Sex: Female) Treating [name] Primary Care Physician/Extender: [name] Physician: Referring Physician: Melina [name] in Treatment: 18 Active Problems Location of Pain  Severity and Description of Pain Patient Has Paino No Site Locations With Dressing Change: No Pain Management and Medication Current Pain Management: Electronic Signature(s) Signed: 03/27/2016 4:39:12 PM By: [name] BSN, [name] Entered By: [name] on 03/27/2016 15:08:50 Dukeman, [name] ([id number]) -------------------------------------------------------------------------------- Patient/Caregiver Education Details Patient Name: [name]. Date of Service: 03/27/2016 3:00 PM Medical Record Patient Account Number: [id number] Number: Treating [name]: [name], BSN, [name] [birth year] [phone] y.o. Other Clinician: Date of Birth/Gender: Female) Treating [name] Primary Care Physician/Extender: [name] Physician: Suella Grove in Treatment: 18 Referring Physician: [name] Education Assessment Education Provided To: Patient Education Topics Provided Venous: Methods: Explain/Verbal Responses: State content correctly Welcome To [facility]: Methods: Explain/Verbal Responses: State content correctly Wound/Skin Impairment: Methods: Explain/Verbal Responses: State content correctly Electronic Signature(s) Signed: 03/27/2016 4:39:12 PM By: [name] BSN, [name] Entered By: [name] on 03/27/2016 16:36:00 Venuto, [name] ([id number]) -------------------------------------------------------------------------------- Wound Assessment Details Patient Name: Carrie Mulligan C. Date of Service: 03/27/2016 3:00 PM Medical Record Patient Account Number: [id number] Number: Treating [name]: [name], BSN, [name] 03-30-1935 [phone] y.o. Other Clinician: Date of Birth/Sex: Female) Treating [name] Primary Care Physician/Extender: [name] Physician: Referring Physician: Melina [name] in Treatment: 18 Wound Status Wound Number: 4 Primary Skin Tear Etiology: Wound Location: Right Lower Leg - Proximal Wound Status:  Open Wounding Event: Shear/Friction Comorbid Cataracts, Arrhythmia, Hypotension, Date Acquired: 02/28/2016 History: Osteoarthritis Weeks Of Treatment: 4 Clustered Wound: No Photos Photo Uploaded By: [name] on 03/27/2016 16:50:26 Wound Measurements Length: (cm) 0.4 % Reduction i Width: (cm) 0.4 % Reduction i Depth: (cm) 0.1 Epithelializa Area: (cm) 0.126 Tunneling: Volume: (cm) 0.013 Undermining: n Area: -77.5% n Volume: -  85.7% tion: Large (67-100%) No No Wound Description Classification: Partial Thickness Wound Margin: Distinct, outline attached Exudate Amount: Small Exudate Type: Sanguinous Exudate Color: red Foul Odor After Cleansing: No Wound Bed Granulation Amount: Large (67-100%) Exposed Structure Necrotic Amount: None Present (0%) Fascia Exposed: No Fat Layer Exposed: No Tendon Exposed: No Taitt, [name] ([id number]) Muscle Exposed: No Joint Exposed: No Bone Exposed: No Limited to Skin Breakdown Periwound Skin Texture Texture Color No Abnormalities Noted: No No Abnormalities Noted: No Callus: No Atrophie Blanche: No Crepitus: No Cyanosis: No Excoriation: No Ecchymosis: No Fluctuance: No Erythema: No Friable: No Hemosiderin Staining: No Induration: No Mottled: Yes Localized Edema: Yes Pallor: No Rash: No Rubor: No Scarring: No Temperature / Pain Moisture Temperature: No Abnormality No Abnormalities Noted: No Tenderness on Palpation: Yes Dry / Scaly: No Maceration: No Moist: Yes Wound Preparation Ulcer Cleansing: Other: soap and water, Topical Anesthetic Applied: Other: lidocaine 4%, Electronic Signature(s) Signed: 03/27/2016 4:39:12 PM By: [name] BSN, [name] Entered By: [name] on 03/27/2016 15:19:29 Tschetter, [name] ([id number]) -------------------------------------------------------------------------------- Wound Assessment Details Patient Name: Carrie Mulligan C. Date of Service: 03/27/2016 3:00 PM Medical  Record Patient Account Number: [id number] Number: Treating [name]: [name], BSN, [name] 1934/12/23 [phone] y.o. Other Clinician: Date of Birth/Sex: Female) Treating [name] Primary Care Physician/Extender: [name] Physician: Referring Physician: Melina [name] in Treatment: 18 Wound Status Wound Number: 5 Primary Skin Tear Etiology: Wound Location: Right Lower Leg - Distal Wound Status: Healed - Epithelialized Wounding Event: Shear/Friction Comorbid Cataracts, Arrhythmia, Hypotension, Date Acquired: 02/28/2016 History: Osteoarthritis Weeks Of Treatment: 4 Clustered Wound: No Photos Photo Uploaded By: [name] on 03/27/2016 16:50:26 Wound Measurements Length: (cm) Width: (cm) Depth: (cm) Area: (cm) Volume: (cm) 0 % Reduction in Area: 100% 0 % Reduction in Volume: 100% 0 Epithelialization: Large (67-100%) 0 Tunneling: No 0 Undermining: No Wound Description Classification: Partial Thickness Wound Margin: Distinct, outline attached Exudate Amount: None Present Foul Odor After Cleansing: No Wound Bed Granulation Amount: None Present (0%) Exposed Structure Necrotic Amount: None Present (0%) Fascia Exposed: No Fat Layer Exposed: No Tendon Exposed: No Muscle Exposed: No Joint Exposed: No Sterry, [name] ([id number]) Bone Exposed: No Limited to Skin Breakdown Periwound Skin Texture Texture Color No Abnormalities Noted: No No Abnormalities Noted: No Callus: No Atrophie Blanche: No Crepitus: No Cyanosis: No Excoriation: No Ecchymosis: Yes Fluctuance: No Erythema: No Friable: No Hemosiderin Staining: No Induration: No Mottled: Yes Localized Edema: Yes Pallor: No Rash: No Rubor: No Scarring: No Temperature / Pain Moisture Temperature: No Abnormality No Abnormalities Noted: No Dry / Scaly: Yes Maceration: No Moist: No Wound Preparation Ulcer Cleansing: Other: soap and water, Topical Anesthetic Applied: None Electronic  Signature(s) Signed: 03/27/2016 4:39:12 PM By: [name] BSN, [name] Entered By: [name] on 03/27/2016 15:20:07 Kimberley, [name] ([id number]) -------------------------------------------------------------------------------- Vitals Details Patient Name: Carrie Mulligan C. Date of Service: 03/27/2016 3:00 PM Medical Record Patient Account Number: [id number] Number: Treating [name]: [name], BSN, [name] 1934/09/30 [phone] y.o. Other Clinician: Date of Birth/Sex: Female) Treating [name] Primary Care Physician/Extender: [name] Physician: Referring Physician: Melina [name] in Treatment: 18 Vital Signs Time Taken: 15:12 Temperature (F): 97.7 Height (in): 68 Pulse (bpm): 65 Weight (lbs): 178 Respiratory Rate (breaths/min): 17 Body Mass Index (BMI): 27.1 Blood Pressure (mmHg): 136/66 Reference Range: 80 - 120 mg / dl Electronic Signature(s) Signed: 03/27/2016 4:39:12 PM By: [name] BSN, [name] Entered By: [name] on 03/27/2016 15:12:20

## 2016-04-05 NOTE — Progress Notes (Signed)
[name] ([id number]) Visit Report for 03/27/2016 Chief Complaint Document Details [name] Date of Service: 03/27/2016 3:00 PM Patient Name: C. Patient Account Number: [id number] Medical Record Treating RN: Carrie Gouty RN, [name], [name] [id number] Number: Other Clinician: 10-05-1934 (82 y.o. Treating [name] Date of Birth/Sex: Female) Physician/Extender: G Primary Care [name] Physician: Referring Physician: Melina [name] in Treatment: 18 Information Obtained from: Patient Chief Complaint Patient is here for review of bilateral lower extremity wounds dating back to December 2016 Electronic Signature(s) Signed: 04/05/2016 8:10:29 AM By: Carrie Ham MD Entered By: [name] on 03/27/2016 16:03:14 [name], [name] ([id number]) -------------------------------------------------------------------------------- HPI Details [name] Date of Service: 03/27/2016 3:00 PM Patient Name: C. Patient Account Number: [id number] Medical Record Treating RN: Carrie Gouty RN, [name], [name] [id number] Number: Other Clinician: 02/25/1935 (81 y.o. Treating [name], [place] Date of Birth/Sex: Female) Physician/Extender: G [place] Physician: Referring Physician: Melina [name] in Treatment: 18 History of Present Illness HPI Description: 11/22/15; this is [name] is a 81 year old woman who lives at home on her own. According to the patient and her daughter was present she has had long-standing edema in her legs dating back many years. She also has a history of chronic systolic heart failure, atrial fibrillation and is status post mitral valve replacement. [facility] showed an ejection fraction of 40-45% she is on Lasix 60 mg a day and spironolactone 25 mg a day. Her current problem began in December around Christmas time she developed a small hematoma in the medial part of her left leg which rapidly  expanded to a very large hematoma that required surgical debridement. This situation was complicated by the fact that the patient is on long-standing Coumadin for mechanical heart valve. She went to the OR had this evacuated on January 4 /17. The wound has gradually improved however she has developed a small wounds around this area and more recently a wound on the right lateral leg. She is weeping edema fluid. The patient is already been to see vascular surgery. It was recommended that she wear Unna boots, she did not tolerate this due to pain in the left leg. She was then prescribed Juzo stockings and really can't get these on herself although truthfully there is probably too much edema for a Juzo stockings currently. She is not a diabetic and has no history of PAD or claudication that I could elicit. She does not use the external compression pumps reliably. She comes today with notes from her primary physician and Dr. Ronalee [name] both recommending various forms of compression but the patient does not really complied with them. Has been using the external compression pumps with some regularity but certainly not daily on the right leg and this has helped. I also note that her daughter tells me the history that she does not sleep in bed at home. She has a hospital bed but with her legs up she finds this painful so she sleeps in the couch sitting up with her legs dependent. 11/29/15; the patient is arrives today accompanied by her son. He expresses satisfaction that she is maintained the compression all week. 12/06/15; the patient has kept her Profore light wraps on, we have good edema control no major change in the wounds we have been using Aquacel 12/13/15; changed to RTD last week. One of the 3 wounds on her left medial leg is healed she has 2 remaining wounds here and one on the right lateral leg. 12/18/2015 -- the  patient was at [name] office today and he was seeing her for an arterial study.  While the wrap was being removed she had an inadvertent laceration of the left proximal anterior leg which bled quite profusely and a compression dressing was applied over this and the patient was here to get her Profore wraps done. I was asked to see the patient to make an assessment and treat appropriately. 12/26/15; the patient's injury on the left proximal leg and [name] removed after soaking. There is an open area here. The original wounds to still open on the right lateral and left medial leg. 01/03/16 patient's injury on her proximal left leg looks quite good. Still small open area on the medial left leg which appears to be improving. The area on the right lateral leg still substantially open with no real improvement in wound depth. Her edema control is marginal with a Profore light. We have been using RTD [name], [name] ([id number]) for 3-4 weeks without any major change 01/10/16: wounds without s/s of infection. vascular results are pending regarding arterial studies. 01/17/16; patient comes in today complaining of severe pain however I think most of this is in the right hip not related to her wounds. She continues with a oval-shaped wound on the right lateral leg, trauma to the left anterior leg just below her tibial plateau. She has a smaller eschar on the left anterior leg. 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 The area which was a scissor injury on her left upper anterior lower leg is fully epithelialized. She only has a small open area remaining on the medial aspect of her left leg. The oval-shaped wound on the right mid lateral leg may be a bit smaller. Debrided of surface slough nonviable subcutaneous tissue. I had changed to collagen 2 weeks ago in  an attempt to get this to close 01/31/16 all the patient's wounds on her left leg give healed. We have good edema control with bilateral Profore lites which she has been compliant with. She still has the oval-shaped wound on the right lateral calf allergic even this appears to be slowly improving. The patient has juxtalite stockings at home. She states she thinks she can put these on. She also has external compression pumps at home although I think her compliance with this has not been good in the past. She complains today of edema in her thighs. Tells me she takes 40 mg of Lasix daily 02/21/16; only 1 small wound remains on the lateral aspect of the right calf. She is using Juzo stockings on the left leg although she complains about difficulty in applying them. She has external compression pumps at home 02/28/16; the small open wound on her right lateral calf is improved in terms of wound area. It appears that she has a wrap injury on the anterior aspect of the upper leg 03/06/16; the small open wound on her right lateral calf has a very small open area remaining. The wrap injury on the anterior aspect of the upper leg also appears better. She arrives today in clinic with a history of dyspnea with minimal exertion starting with the last 2-4 days. She does not describe chest pain. Her son and our intake nurse think she has facial swelling. She has  a history of an artificial mitral valve on Coumadin 03/13/16; the small wound on the right lateral calf is no better this week. The superior wrap injury anteriorly requires debridement of surface eschar and nonviable subcutaneous tissue. She has been to her primary doctor with regards to her dyspnea we identified last week. Per the patient's description her Lasix has been increased 03/21/2016 -- patient of Dr. Dellia Nims who could not keep her appointment yesterday but has come in today with the right lower leg looking good and this is the leg which has been  treated in the recent past. However, her left lower extremity is extremely swollen, tender and edematous with redness and discoloration. 03/27/16; there is only 2 small areas remain on the right leg. The edema that was so concerning last week has come down however there is extensive bruising on the lateral left leg medial left foot suggesting that she lost some blood into the leg itself. I checked her hemoglobin on 9/21 was 14.5. Her INR was over 6. Duplex ultrasound was negative for DVT Electronic Signature(s) Signed: 04/05/2016 8:10:29 AM By: Carrie Ham MD Entered By: [name] on 03/27/2016 16:17:59 [place], [name] ([id number]) -------------------------------------------------------------------------------- Physical Exam Details [name] Date of Service: 03/27/2016 3:00 PM Patient Name: C. Patient Account Number: [id number] Medical Record Treating RN: Carrie Gouty RN, [name], [name] [id number] Number: Other Clinician: June 04, 1935 (81 y.o. Treating Jaquavian Firkus Date of Birth/Sex: Female) Physician/Extender: G Primary Care [name] Physician: Referring Physician: Melina [name] in Treatment: 18 Constitutional Sitting or standing Blood Pressure is within target range for patient.. Pulse regular and within target range for patient.Marland Kitchen Respirations regular, non-labored and within target range.. Temperature is normal and within the target range for the patient.. Patient's appearance is neat and clean. Appears in no acute distress. Well nourished and well developed.. Eyes Conjunctivae clear. No discharge.Marland Kitchen Respiratory Respiratory effort is easy and symmetric bilaterally. Rate is normal at rest and on room air.. Cardiovascular Pedal pulses palpable and strong bilaterally.. Gastrointestinal (GI) Abdomen is soft and non-distended without masses or tenderness. Bowel sounds active in all quadrants.. Integumentary (Hair, Skin) Multiple large ecchymosis in her upper  extremities. Discoloration of her left leg especially laterally. Old bruising into the side of her foot and her toes on the left. At the upper edge of her wrap there is also old bruising. Psychiatric Patient is in a lot of pain secondary to compression fractures in her back. She appears to be of. Notes Wound exam; the right lower extremity lymphedema/venous insufficiency as well controlled the wound is clean-looking the only thing of substance here is on the upper right leg. No debridement was required. Left leg edema is down there is a lot of old bruising here. Peripheral pulses are palpable. Electronic Signature(s) Signed: 04/05/2016 8:10:29 AM By: Carrie Ham MD Entered By: [name] on 03/27/2016 16:08:00 [name] ([id number]) -------------------------------------------------------------------------------- Physician Orders Details [name] Date of Service: 03/27/2016 3:00 PM Patient Name: C. Patient Account Number: [id number] Medical Record Treating RN: Carrie Gouty RN, [name], [name] [id number] Number: Other Clinician: December 09, 1934 (81 y.o. Treating Shonteria Abeln, [place] Date of Birth/Sex: Female) Physician/Extender: G Primary Care [name] Physician: Referring Physician: Melina [name] in Treatment: 68 Verbal / Phone Orders: Yes Clinician: [place], RN, [name], Rita Read Back and Verified: Yes Diagnosis Coding Wound Cleansing Wound #4 Right,Proximal Lower Leg o Cleanse wound with mild soap and water o May shower with protection. Primary Wound Dressing Wound #4 Right,Proximal Lower Leg o Hydrafera Blue Secondary Dressing Wound #4  Right,Proximal Lower Leg o ABD pad Dressing Change Frequency Wound #4 Right,Proximal Lower Leg o Change dressing every week Follow-up Appointments Wound #4 Right,Proximal Lower Leg o Return Appointment in 1 week. Edema Control Wound #4 Right,Proximal Lower Leg o Elevate legs to the level of the heart and pump  ankles as often as possible o Other: - Kerlix and Coban Additional Orders / Instructions Wound #4 Right,Proximal Lower Leg o Increase protein intake. o Activity as tolerated ALLEA, KASSNER ([id number]) Electronic Signature(s) Signed: 03/27/2016 4:39:12 PM By: Regan [name] BSN, RN Signed: 04/05/2016 8:10:29 AM By: Carrie Ham MD Entered By: Regan [name] on 03/27/2016 15:45:59 [place], [name] ([id number]) -------------------------------------------------------------------------------- Problem List Details Patient Name: KARESA, MAULTSBY 03/27/2016 3:00 Date of Service: PM Medical Record [id number] Number: Patient Account Number: [id number] 25-Jul-1934 (81 y.o. Treating RN: Date of Birth/Sex: Female) Other Clinician: Primary Care Physician: [name] Treating Referring Physician: Emily [name] Physician/Extender: Suella [place] in Treatment: 18 Active Problems Inactive Problems Resolved Problems Electronic Signature(s) Signed: 04/05/2016 8:10:29 AM By: Carrie Ham MD Entered By: [name] on 03/27/2016 16:03:00 [place], [name] ([id number]) -------------------------------------------------------------------------------- Progress Note Details [name] Date of Service: 03/27/2016 3:00 PM Patient Name: C. Patient Account Number: [id number] Medical Record Treating RN: Carrie Gouty RN, [name], [name] [id number] Number: Other Clinician: [birth year] (81 y.o. Treating Esmae Donathan, [place] Date of Birth/Sex: Female) Physician/Extender: G Primary Care [name] Physician: Referring Physician: Melina [name] in Treatment: 18 Subjective Chief Complaint Information obtained from Patient Patient is here for review of bilateral lower extremity wounds dating back to December 2016 History of Present Illness (HPI) 11/22/15; this is [name] is a 81 year old woman who lives at home on her own. According to the patient and her daughter was present she has had  long-standing edema in her legs dating back many years. She also has a history of chronic systolic heart failure, atrial fibrillation and is status post mitral valve replacement. Last echocardiogram I see [facility] showed an ejection fraction of 40-45% she is on Lasix 60 mg a day and spironolactone 25 mg a day. Her current problem began in December around Christmas time she developed a small hematoma in the medial part of her left leg which rapidly expanded to a very large hematoma that required surgical debridement. This situation was complicated by the fact that the patient is on long-standing Coumadin for mechanical heart valve. She went to the OR had this evacuated on January 4 /17. The wound has gradually improved however she has developed a small wounds around this area and more recently a wound on the right lateral leg. She is weeping edema fluid. The patient is already been to see vascular surgery. It was recommended that she wear Unna boots, she did not tolerate this due to pain in the left leg. She was then prescribed Juzo stockings and really can't get these on herself although truthfully there is probably too much edema for a Juzo stockings currently. She is not a diabetic and has no history of PAD or claudication that I could elicit. She does not use the external compression pumps reliably. She comes today with notes from her primary physician and Dr. Ronalee [name] both recommending various forms of compression but the patient does not really complied with them. Has been using the external compression pumps with some regularity but certainly not daily on the right leg and this has helped. I also note that her daughter tells me the history that she does not sleep in bed at home. She has  a hospital bed but with her legs up she finds this painful so she sleeps in the couch sitting up with her legs dependent. 11/29/15; the patient is arrives today accompanied by her son. He expresses  satisfaction that she is maintained the compression all week. 12/06/15; the patient has kept her Profore light wraps on, we have good edema control no major change in the wounds we have been using Aquacel 12/13/15; changed to RTD last week. One of the 3 wounds on her left medial leg is healed she has 2 remaining wounds here and one on the right lateral leg. 12/18/2015 -- the patient was at [name] office today and he was seeing her for an arterial study. While the wrap was being removed she had an inadvertent laceration of the left proximal anterior leg which bled quite profusely and a compression dressing was applied over this and the patient was here to get her Profore wraps done. I was asked to see the patient to make an assessment and treat appropriately. MELAINE, MCPHEE ([id number]) 12/26/15; the patient's injury on the left proximal leg and [name] removed after soaking. There is an open area here. The original wounds to still open on the right lateral and left medial leg. 01/03/16 patient's injury on her proximal left leg looks quite good. Still small open area on the medial left leg which appears to be improving. The area on the right lateral leg still substantially open with no real improvement in wound depth. Her edema control is marginal with a Profore light. We have been using RTD for 3-4 weeks without any major change 01/10/16: wounds without s/s of infection. vascular results are pending regarding arterial studies. 01/17/16; patient comes in today complaining of severe pain however I think most of this is in the right hip not related to her wounds. She continues with a oval-shaped wound on the right lateral leg, trauma to the left anterior leg just below her tibial plateau. She has a smaller eschar on the left anterior leg. 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 The area which was a scissor injury on her left upper anterior lower leg is fully epithelialized. She only has a small open area remaining on the medial aspect of her left leg. The oval-shaped wound on the right mid lateral leg may be a bit smaller. Debrided of surface slough nonviable subcutaneous tissue. I had changed to collagen 2 weeks ago in an attempt to get this to close 01/31/16 all the patient's wounds on her left leg give healed. We have good edema control with bilateral Profore lites which she has been compliant with. She still has the oval-shaped wound on the right lateral calf allergic even this appears to be slowly improving. The patient has juxtalite stockings at home. She states she thinks she can put these on. She also has external compression pumps at home although I think her compliance with this has not been good in the past. She complains today of edema in her thighs. Tells me she takes 40 mg of Lasix daily 02/21/16; only 1 small wound remains on the lateral aspect of the right calf. She is using Juzo stockings on the left leg although she complains about difficulty in applying them. She has external compression  pumps at home 02/28/16; the small open wound on her right lateral calf is improved in terms of wound area. It appears that she has a wrap injury on the anterior aspect of the upper leg 03/06/16; the small open wound on her right lateral calf has a very small open area remaining. The wrap injury on the anterior aspect of the upper leg also appears better. She arrives today in clinic with a history of dyspnea with minimal exertion starting with the last 2-4 days. She does not describe chest pain. Her son and our intake nurse think she has facial swelling. She has a history of an artificial mitral valve on Coumadin 03/13/16; the small wound on the right lateral  calf is no better this week. The superior wrap injury anteriorly requires debridement of surface eschar and nonviable subcutaneous tissue. She has been to her primary doctor with regards to her dyspnea we identified last week. Per the patient's description her Lasix has been increased 03/21/2016 -- patient of Dr. Dellia Nims who could not keep her appointment yesterday but has come in today with the right lower leg looking good and this is the leg which has been treated in the recent past. However, her left lower extremity is extremely swollen, tender and edematous with redness and discoloration. Objective Constitutional Sitting or standing Blood Pressure is within target range for patient.. Pulse regular and within target range [name], [name] ([id number]) for patient.Marland Kitchen Respirations regular, non-labored and within target range.. Temperature is normal and within the target range for the patient.. Patient's appearance is neat and clean. Appears in no acute distress. Well nourished and well developed.. Vitals Time Taken: 3:12 PM, Height: 68 in, Weight: 178 lbs, BMI: 27.1, Temperature: 97.7 F, Pulse: 65 bpm, Respiratory Rate: 17 breaths/min, Blood Pressure: 136/66 mmHg. Eyes Conjunctivae clear. No discharge.Marland Kitchen Respiratory Respiratory effort is easy and symmetric bilaterally. Rate is normal at rest and on room air.. Cardiovascular Pedal pulses palpable and strong bilaterally.. Gastrointestinal (GI) Abdomen is soft and non-distended without masses or tenderness. Bowel sounds active in all quadrants.. Psychiatric Patient is in a lot of pain secondary to compression fractures in her back. She appears to be of. General Notes: Wound exam; the right lower extremity lymphedema/venous insufficiency as well controlled the wound is clean-looking the only thing of substance here is on the upper right leg. No debridement was required. Left leg edema is down there is a lot of old bruising here. Peripheral  pulses are palpable. Integumentary (Hair, Skin) Multiple large ecchymosis in her upper extremities. Discoloration of her left leg especially laterally. Old bruising into the side of her foot and her toes on the left. At the upper edge of her wrap there is also old bruising. Wound #4 status is Open. Original cause of wound was Shear/Friction. The wound is located on the Right,Proximal Lower Leg. The wound measures 0.4cm length x 0.4cm width x 0.1cm depth; 0.126cm^2 area and 0.013cm^3 volume. The wound is limited to skin breakdown. There is no tunneling or undermining noted. There is a small amount of sanguinous drainage noted. The wound margin is distinct with the outline attached to the wound base. There is large (67-100%) granulation within the wound bed. There is no necrotic tissue within the wound bed. The periwound skin appearance exhibited: Localized Edema, Moist, Mottled. The periwound skin appearance did not exhibit: Callus, Crepitus, Excoriation, Fluctuance, Friable, Induration, Rash, Scarring, Dry/Scaly, Maceration, Atrophie Blanche, Cyanosis, Ecchymosis, Hemosiderin Staining, Pallor, Rubor, Erythema. Periwound temperature was noted as No Abnormality. The  periwound has tenderness on palpation. Wound #5 status is Healed - Epithelialized. Original cause of wound was Shear/Friction. The wound is located on the Right,Distal Lower Leg. The wound measures 0cm length x 0cm width x 0cm depth; 0cm^2 area and 0cm^3 volume. The wound is limited to skin breakdown. There is no tunneling or undermining noted. There is a none present amount of drainage noted. The wound margin is distinct with the outline attached to the wound base. There is no granulation within the wound bed. There is no necrotic tissue within the wound bed. The periwound skin appearance exhibited: Localized Edema, Dry/Scaly, Ecchymosis, Mottled. The periwound skin appearance did not exhibit: Callus, Crepitus, Excoriation,  Fluctuance, Friable, Flenner, Bridgit C. ([id number]) Induration, Rash, Scarring, Maceration, Moist, Atrophie Blanche, Cyanosis, Hemosiderin Staining, Pallor, Rubor, Erythema. Periwound temperature was noted as No Abnormality. Assessment Plan Wound Cleansing: Wound #4 Right,Proximal Lower Leg: Cleanse wound with mild soap and water May shower with protection. Primary Wound Dressing: Wound #4 Right,Proximal Lower Leg: Hydrafera Blue Secondary Dressing: Wound #4 Right,Proximal Lower Leg: ABD pad Dressing Change Frequency: Wound #4 Right,Proximal Lower Leg: Change dressing every week Follow-up Appointments: Wound #4 Right,Proximal Lower Leg: Return Appointment in 1 week. Edema Control: Wound #4 Right,Proximal Lower Leg: Elevate legs to the level of the heart and pump ankles as often as possible Other: - Kerlix and Coban Additional Orders / Instructions: Wound #4 Right,Proximal Lower Leg: Increase protein intake. Activity as tolerated #1 the swelling in the left leg was probably related to some blood loss and it. I've looked in Epic her hemoglobin was 14.5 on 9/21 the right therefore I think that she is stable. Her INR however was 6.86. LANELL, CARPENTER ([id number]) #2 her lower extremity wounds look considerably better in fact they're almost closed on the right. No open areas are seen on the left. #3 I wrapped both her legs in order to support this scan with extensive bruising on the left side. Electronic Signature(s) Signed: 04/05/2016 8:10:29 AM By: Carrie Ham MD Entered By: [name] on 03/27/2016 16:14:31 Timpone, [name] ([id number]) -------------------------------------------------------------------------------- SuperBill Details Patient Name: [name]. Date of Service: 03/27/2016 Medical Record Patient Account Number: [id number] Number: Treating RN: Carrie Gouty, RN, [name], [name] [birth year] [phone] y.o. Other Clinician: Date of  Birth/Sex: Female) Treating Reymundo Winship Primary Care Physician/Extender: Claudette Laws Physician: Suella Grove in Treatment: 18 Referring Physician: Emily [name] Diagnosis Coding ICD-10 Codes Code Description Chronic venous hypertension (idiopathic) with ulcer and inflammation of bilateral lower I87.333 extremity I89.0 Lymphedema, not elsewhere classified X52.84 Chronic systolic (congestive) heart failure X32.440N Laceration without foreign body, left lower leg, initial encounter Facility Procedures CPT4 Code: 02725366 Description: 44034 - WOUND CARE VISIT-LEV 2 EST PT Modifier: Quantity: 1 Physician Procedures CPT4: Description Modifier Quantity Code [id number] [id number] - WC PHYS LEVEL 3 - EST PT 1 ICD-10 Description Diagnosis I87.333 Chronic venous hypertension (idiopathic) with ulcer and inflammation of bilateral lower extremity Electronic Signature(s) Signed: 03/28/2016 12:28:45 PM By: Regan [name] BSN, RN Signed: 04/05/2016 8:10:29 AM By: Carrie Ham MD Entered By: Regan [name] on 03/28/2016 12:28:45

## 2016-04-08 ENCOUNTER — Telehealth (HOSPITAL_COMMUNITY): Payer: Self-pay

## 2016-04-08 NOTE — Telephone Encounter (Signed)
Family called concerned about pt's procedure. I called [name] back and let him know that the insurance request has not come back yet and that I would give them a call as soon as I receive it. We will schedule then. [name] son agreed with this plan. AW

## 2016-04-09 ENCOUNTER — Telehealth (HOSPITAL_COMMUNITY): Payer: Self-pay | Admitting: Radiology

## 2016-04-09 ENCOUNTER — Other Ambulatory Visit (HOSPITAL_COMMUNITY): Payer: Self-pay | Admitting: Interventional Radiology

## 2016-04-09 DIAGNOSIS — S22000A Wedge compression fracture of unspecified thoracic vertebra, initial encounter for closed fracture: Secondary | ICD-10-CM

## 2016-04-09 NOTE — Telephone Encounter (Signed)
Called both pt's daughter and son to speak w/ them about the pt having a KP/VP and her cardiology recommendations for her Lovenox bridge. Left both a message. Will try them back tomorrow. JM

## 2016-04-10 ENCOUNTER — Other Ambulatory Visit (HOSPITAL_COMMUNITY): Payer: Self-pay

## 2016-04-10 ENCOUNTER — Encounter: Payer: Self-pay | Admitting: *Deleted

## 2016-04-10 ENCOUNTER — Other Ambulatory Visit: Payer: Self-pay

## 2016-04-10 ENCOUNTER — Emergency Department (HOSPITAL_COMMUNITY): Payer: Medicare Other

## 2016-04-10 ENCOUNTER — Inpatient Hospital Stay (HOSPITAL_COMMUNITY)
Admission: EM | Admit: 2016-04-10 | Discharge: 2016-04-19 | DRG: 516 | Disposition: A | Discharge status: Skilled Nursing Facility | Payer: Medicare Other | Attending: Internal Medicine | Admitting: Internal Medicine

## 2016-04-10 ENCOUNTER — Ambulatory Visit: Payer: Medicare Other | Admitting: Physician Assistant

## 2016-04-10 ENCOUNTER — Encounter (HOSPITAL_COMMUNITY): Payer: Self-pay | Admitting: *Deleted

## 2016-04-10 DIAGNOSIS — I890 Lymphedema, not elsewhere classified: Secondary | ICD-10-CM | POA: Diagnosis present

## 2016-04-10 DIAGNOSIS — M4854XA Collapsed vertebra, not elsewhere classified, thoracic region, initial encounter for fracture: Secondary | ICD-10-CM | POA: Diagnosis present

## 2016-04-10 DIAGNOSIS — K5903 Drug induced constipation: Secondary | ICD-10-CM | POA: Diagnosis present

## 2016-04-10 DIAGNOSIS — Z6825 Body mass index (BMI) 25.0-25.9, adult: Secondary | ICD-10-CM

## 2016-04-10 DIAGNOSIS — Z841 Family history of disorders of kidney and ureter: Secondary | ICD-10-CM

## 2016-04-10 DIAGNOSIS — N183 Chronic kidney disease, stage 3 (moderate): Secondary | ICD-10-CM | POA: Diagnosis present

## 2016-04-10 DIAGNOSIS — L89312 Pressure ulcer of right buttock, stage 2: Secondary | ICD-10-CM | POA: Diagnosis present

## 2016-04-10 DIAGNOSIS — Z7901 Long term (current) use of anticoagulants: Secondary | ICD-10-CM | POA: Diagnosis not present

## 2016-04-10 DIAGNOSIS — Z7982 Long term (current) use of aspirin: Secondary | ICD-10-CM

## 2016-04-10 DIAGNOSIS — Z952 Presence of prosthetic heart valve: Secondary | ICD-10-CM | POA: Diagnosis not present

## 2016-04-10 DIAGNOSIS — Z880 Allergy status to penicillin: Secondary | ICD-10-CM

## 2016-04-10 DIAGNOSIS — IMO0001 Reserved for inherently not codable concepts without codable children: Secondary | ICD-10-CM

## 2016-04-10 DIAGNOSIS — I130 Hypertensive heart and chronic kidney disease with heart failure and stage 1 through stage 4 chronic kidney disease, or unspecified chronic kidney disease: Secondary | ICD-10-CM | POA: Diagnosis present

## 2016-04-10 DIAGNOSIS — M4850XA Collapsed vertebra, not elsewhere classified, site unspecified, initial encounter for fracture: Secondary | ICD-10-CM | POA: Diagnosis present

## 2016-04-10 DIAGNOSIS — I482 Chronic atrial fibrillation: Secondary | ICD-10-CM | POA: Diagnosis present

## 2016-04-10 DIAGNOSIS — I429 Cardiomyopathy, unspecified: Secondary | ICD-10-CM | POA: Diagnosis present

## 2016-04-10 DIAGNOSIS — Z9889 Other specified postprocedural states: Secondary | ICD-10-CM | POA: Diagnosis not present

## 2016-04-10 DIAGNOSIS — Z87891 Personal history of nicotine dependence: Secondary | ICD-10-CM

## 2016-04-10 DIAGNOSIS — Z823 Family history of stroke: Secondary | ICD-10-CM | POA: Diagnosis not present

## 2016-04-10 DIAGNOSIS — M545 Low back pain, unspecified: Secondary | ICD-10-CM

## 2016-04-10 DIAGNOSIS — R52 Pain, unspecified: Secondary | ICD-10-CM | POA: Diagnosis not present

## 2016-04-10 DIAGNOSIS — E440 Moderate protein-calorie malnutrition: Secondary | ICD-10-CM | POA: Diagnosis present

## 2016-04-10 DIAGNOSIS — Z9841 Cataract extraction status, right eye: Secondary | ICD-10-CM

## 2016-04-10 DIAGNOSIS — M4850XS Collapsed vertebra, not elsewhere classified, site unspecified, sequela of fracture: Secondary | ICD-10-CM | POA: Diagnosis not present

## 2016-04-10 DIAGNOSIS — K8020 Calculus of gallbladder without cholecystitis without obstruction: Secondary | ICD-10-CM | POA: Diagnosis present

## 2016-04-10 DIAGNOSIS — R001 Bradycardia, unspecified: Secondary | ICD-10-CM | POA: Diagnosis present

## 2016-04-10 DIAGNOSIS — W19XXXA Unspecified fall, initial encounter: Secondary | ICD-10-CM | POA: Diagnosis present

## 2016-04-10 DIAGNOSIS — L8990 Pressure ulcer of unspecified site, unspecified stage: Secondary | ICD-10-CM | POA: Insufficient documentation

## 2016-04-10 DIAGNOSIS — Y92009 Unspecified place in unspecified non-institutional (private) residence as the place of occurrence of the external cause: Secondary | ICD-10-CM | POA: Diagnosis not present

## 2016-04-10 DIAGNOSIS — Z888 Allergy status to other drugs, medicaments and biological substances status: Secondary | ICD-10-CM | POA: Diagnosis not present

## 2016-04-10 DIAGNOSIS — M549 Dorsalgia, unspecified: Secondary | ICD-10-CM

## 2016-04-10 DIAGNOSIS — S22000A Wedge compression fracture of unspecified thoracic vertebra, initial encounter for closed fracture: Secondary | ICD-10-CM

## 2016-04-10 DIAGNOSIS — M1990 Unspecified osteoarthritis, unspecified site: Secondary | ICD-10-CM | POA: Diagnosis present

## 2016-04-10 DIAGNOSIS — Z7401 Bed confinement status: Secondary | ICD-10-CM

## 2016-04-10 DIAGNOSIS — I5042 Chronic combined systolic (congestive) and diastolic (congestive) heart failure: Secondary | ICD-10-CM | POA: Diagnosis present

## 2016-04-10 DIAGNOSIS — L89322 Pressure ulcer of left buttock, stage 2: Secondary | ICD-10-CM | POA: Diagnosis present

## 2016-04-10 DIAGNOSIS — R2681 Unsteadiness on feet: Secondary | ICD-10-CM

## 2016-04-10 DIAGNOSIS — I459 Conduction disorder, unspecified: Secondary | ICD-10-CM | POA: Diagnosis present

## 2016-04-10 DIAGNOSIS — I5032 Chronic diastolic (congestive) heart failure: Secondary | ICD-10-CM | POA: Diagnosis not present

## 2016-04-10 DIAGNOSIS — I5022 Chronic systolic (congestive) heart failure: Secondary | ICD-10-CM | POA: Diagnosis present

## 2016-04-10 DIAGNOSIS — Z79899 Other long term (current) drug therapy: Secondary | ICD-10-CM

## 2016-04-10 DIAGNOSIS — Z9181 History of falling: Secondary | ICD-10-CM

## 2016-04-10 DIAGNOSIS — K219 Gastro-esophageal reflux disease without esophagitis: Secondary | ICD-10-CM | POA: Diagnosis present

## 2016-04-10 DIAGNOSIS — R748 Abnormal levels of other serum enzymes: Secondary | ICD-10-CM | POA: Diagnosis present

## 2016-04-10 HISTORY — DX: Dorsalgia, unspecified: M54.9

## 2016-04-10 HISTORY — DX: Other chronic pain: G89.29

## 2016-04-10 LAB — BASIC METABOLIC PANEL
ANION GAP: 11 (ref 5–15)
BUN: 26 mg/dL — ABNORMAL HIGH (ref 6–20)
CALCIUM: 9.7 mg/dL (ref 8.9–10.3)
CHLORIDE: 100 mmol/L — AB (ref 101–111)
CO2: 25 mmol/L (ref 22–32)
Creatinine, Ser: 1.75 mg/dL — ABNORMAL HIGH (ref 0.44–1.00)
GFR calc non Af Amer: 27 mL/min — ABNORMAL LOW (ref >60)
GFR, EST AFRICAN AMERICAN: 31 mL/min — AB (ref >60)
Glucose, Bld: 95 mg/dL (ref 65–99)
POTASSIUM: 3.6 mmol/L (ref 3.5–5.1)
Sodium: 136 mmol/L (ref 135–145)

## 2016-04-10 LAB — CBC
HEMATOCRIT: 41.6 % (ref 36.0–46.0)
HEMOGLOBIN: 13.3 g/dL (ref 12.0–15.0)
MCH: 26.5 pg (ref 26.0–34.0)
MCHC: 32 g/dL (ref 30.0–36.0)
MCV: 83 fL (ref 78.0–100.0)
Platelets: 221 10*3/uL (ref 150–400)
RBC: 5.01 MIL/uL (ref 3.87–5.11)
RDW: 21.9 % — AB (ref 11.5–15.5)
WBC: 4.8 10*3/uL (ref 4.0–10.5)

## 2016-04-10 LAB — PROTIME-INR
INR: 1.79
Prothrombin Time: 21.1 seconds — ABNORMAL HIGH (ref 11.4–15.2)

## 2016-04-10 MED ORDER — ACETAMINOPHEN 500 MG PO TABS
500.0000 mg | ORAL_TABLET | Freq: Four times a day (QID) | ORAL | Status: DC | PRN
  Administered 2016-04-11 – 2016-04-19 (×12): 500 mg via ORAL
  Filled 2016-04-10 (×13): qty 1

## 2016-04-10 MED ORDER — POLYETHYLENE GLYCOL 3350 17 G PO PACK: 17.0000 g | PACK | Freq: Every day | ORAL | Status: DC | PRN

## 2016-04-10 MED ORDER — BISACODYL 5 MG PO TBEC
5.0000 mg | DELAYED_RELEASE_TABLET | Freq: Every day | ORAL | Status: DC | PRN
  Administered 2016-04-12: 5 mg via ORAL
  Filled 2016-04-10: qty 1

## 2016-04-10 MED ORDER — SODIUM CHLORIDE 0.9% FLUSH
3.0000 mL | Freq: Two times a day (BID) | INTRAVENOUS | Status: DC
  Administered 2016-04-10 – 2016-04-19 (×10): 3 mL via INTRAVENOUS

## 2016-04-10 MED ORDER — ENSURE ENLIVE PO LIQD
237.0000 mL | Freq: Two times a day (BID) | ORAL | Status: DC
Start: 2016-04-11 — End: 2016-04-19
  Administered 2016-04-11 – 2016-04-19 (×4): 237 mL via ORAL

## 2016-04-10 MED ORDER — SODIUM CHLORIDE 0.9% FLUSH: 3.0000 mL | INTRAVENOUS | Status: DC | PRN

## 2016-04-10 MED ORDER — HYDROMORPHONE HCL 1 MG/ML IJ SOLN
0.5000 mg | Freq: Once | INTRAMUSCULAR | Status: AC
Start: 2016-04-10 — End: 2016-04-10
  Administered 2016-04-10: 0.5 mg via INTRAVENOUS
  Filled 2016-04-10: qty 1

## 2016-04-10 MED ORDER — ONDANSETRON HCL 4 MG PO TABS
4.0000 mg | ORAL_TABLET | Freq: Four times a day (QID) | ORAL | Status: DC | PRN
  Administered 2016-04-18: 4 mg via ORAL
  Filled 2016-04-10: qty 1

## 2016-04-10 MED ORDER — FLEET ENEMA 7-19 GM/118ML RE ENEM: 1.0000 | ENEMA | Freq: Once | RECTAL | Status: DC | PRN

## 2016-04-10 MED ORDER — DOCUSATE SODIUM 100 MG PO CAPS
100.0000 mg | ORAL_CAPSULE | Freq: Two times a day (BID) | ORAL | Status: DC
  Administered 2016-04-10 – 2016-04-13 (×6): 100 mg via ORAL
  Filled 2016-04-10 (×6): qty 1

## 2016-04-10 MED ORDER — ONDANSETRON HCL 4 MG/2ML IJ SOLN
4.0000 mg | Freq: Once | INTRAMUSCULAR | Status: AC
  Administered 2016-04-10: 4 mg via INTRAVENOUS
  Filled 2016-04-10: qty 2

## 2016-04-10 MED ORDER — ZINC OXIDE 40 % EX OINT
1.0000 "application " | TOPICAL_OINTMENT | Freq: Three times a day (TID) | CUTANEOUS | Status: DC
  Administered 2016-04-11 – 2016-04-19 (×18): 1 via TOPICAL
  Filled 2016-04-10: qty 114

## 2016-04-10 MED ORDER — OXYCODONE HCL 5 MG PO TABS
5.0000 mg | ORAL_TABLET | ORAL | Status: DC | PRN
  Administered 2016-04-11 – 2016-04-19 (×25): 5 mg via ORAL
  Filled 2016-04-10 (×28): qty 1

## 2016-04-10 MED ORDER — ONDANSETRON HCL 4 MG/2ML IJ SOLN: 4.0000 mg | Freq: Four times a day (QID) | INTRAMUSCULAR | Status: DC | PRN

## 2016-04-10 MED ORDER — SODIUM CHLORIDE 0.9 % IV SOLN: 250.0000 mL | INTRAVENOUS | Status: DC | PRN

## 2016-04-10 MED ORDER — HYDROMORPHONE HCL 1 MG/ML IJ SOLN
0.5000 mg | INTRAMUSCULAR | Status: DC | PRN
  Administered 2016-04-12: 0.5 mg via INTRAVENOUS
  Filled 2016-04-10: qty 1

## 2016-04-10 MED ORDER — METOPROLOL SUCCINATE ER 25 MG PO TB24
25.0000 mg | ORAL_TABLET | Freq: Every day | ORAL | Status: DC
  Administered 2016-04-11 – 2016-04-13 (×3): 25 mg via ORAL
  Filled 2016-04-10 (×4): qty 1

## 2016-04-10 MED ORDER — PANTOPRAZOLE SODIUM 40 MG PO TBEC
40.0000 mg | DELAYED_RELEASE_TABLET | Freq: Every day | ORAL | Status: DC
  Administered 2016-04-11 – 2016-04-19 (×8): 40 mg via ORAL
  Filled 2016-04-10 (×8): qty 1

## 2016-04-10 MED ORDER — HEPARIN (PORCINE) IN NACL 100-0.45 UNIT/ML-% IJ SOLN
1100.0000 [IU]/h | INTRAMUSCULAR | Status: DC
  Administered 2016-04-10 – 2016-04-15 (×6): 1100 [IU]/h via INTRAVENOUS
  Filled 2016-04-10 (×6): qty 250

## 2016-04-10 MED ORDER — HYDROMORPHONE HCL 1 MG/ML IJ SOLN
1.0000 mg | Freq: Once | INTRAMUSCULAR | Status: AC
  Administered 2016-04-10: 1 mg via INTRAVENOUS
  Filled 2016-04-10: qty 1

## 2016-04-10 NOTE — ED Provider Notes (Signed)
[facility] DEPT Provider Note   CSN: [id number] Arrival date & time: 04/10/16  1346     History   Chief Complaint Chief Complaint  Patient presents with  . Back Pain  . Constipation    HPI [name] is a 81 y.o. female.  HPI  Pt presenting with c/o pain in back and around hips.  She fell approx 10 days ago and is planned to have possible kyphoplasty of compression fractures by Dr. Patrecia Pour.  Family does not know when this is scheduled, they state she is having a lot of pain at home.  They have been talking with IR and family thinks they were told she is to be admitted today.  Pt has been taking oxycodone which has not been providing relief and has also caused constipation.  Last stool yesterday very small, last normal BM one week ago.   No leg weakness, no incointinence of bowel or bladder, no urinary retention.  She has been essentially bedbound at home despite use of oxycodone.  Pain is severe and worse with movement and palpation.  There are no other associated systemic symptoms, there are no other alleviating or modifying factors.   Past Medical History:  Diagnosis Date  . Arthritis    "back, hands, knees" (04/10/2016)  . Chronic atrial fibrillation ([place])    a. On Coumadin  . Chronic back pain   . Chronic combined systolic (congestive) and diastolic (congestive) heart failure    a. EF 25% by echo in 08/2015 b. RHC in 08/2015 showed normal filling pressures  . Compression fracture    "several; all in my back" (04/10/2016)  . GERD (gastroesophageal reflux disease)   . Heart murmur   . Hypertension   . S/P MVR (mitral valve replacement)    a. MVR 1994 b. redo MVR in 08/2014 - on Coumadin  . Shortness of breath dyspnea     Patient Active Problem List   Diagnosis Date Noted  . Pressure injury of skin 04/12/2016  . Malnutrition of moderate degree 04/11/2016  . Vertebral compression fracture ([place]) 04/10/2016  . Intractable pain 04/10/2016  . Spinal  compression fracture ([place]) 04/10/2016  . Chronic systolic heart failure ([place]) 11/19/2015  . Systolic and diastolic CHF, acute on chronic ([place])   . Lymphedema   . CKD (chronic kidney disease), stage III   . Bilateral leg edema   . Acute CHF (congestive heart failure) ([place]) 09/28/2015  . Bilateral lower extremity edema 08/30/2015  . ARF (acute renal failure) ([place]) 04/28/2015  . Hyponatremia 04/28/2015  . H/O mitral valve replacement with mechanical valve 11/04/2014  . Long term current use of anticoagulant therapy 09/28/2014  . Chronic combined systolic and diastolic CHF  63/78/5885  . 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 Generalized OA 11/28/2013  . HLD (hyperlipidemia) 11/28/2013    Past Surgical History:  Procedure Laterality Date  . BREAST LUMPECTOMY Right 2005?   benign lump excision  . CARDIAC CATHETERIZATION    . [place], 2016   "MVR; MVR"  . CATARACT EXTRACTION W/PHACO Right 02/20/2015   Procedure: CATARACT EXTRACTION PHACO AND INTRAOCULAR LENS PLACEMENT (IOC);  Surgeon: Estill Cotta, MD;  Location: [facility] ORS;  Service: Ophthalmology;  Laterality: Right;  US01:31.2 AP 25.3% CDE40.13 Fluid lot # [id number]  . ELECTROPHYSIOLOGIC STUDY N/A 10/09/2015   Procedure: CARDIOVERSION;  Surgeon: Wellington Hampshire, MD;  Location: [facility] ORS;  Service: Cardiovascular;  Laterality: N/A;  . IR GENERIC HISTORICAL  04/01/2016   IR  RADIOLOGIST EVAL & MGMT 04/01/2016 [facility] RAD  . IRRIGATION AND DEBRIDEMENT HEMATOMA Left 07/05/2015   Procedure: IRRIGATION AND DEBRIDEMENT HEMATOMA;  Surgeon: Robert Bellow, MD;  Location: [facility] ORS;  Service: General;  Laterality: Left;  . pyloric stenosis  07/1937  . US ECHOCARDIOGRAPHY      OB History    Gravida Para Term Preterm AB Living   2 2           SAB TAB Ectopic Multiple Live Births                  Obstetric Comments   1st Menstrual Cycle:  17  1st Pregnancy:  27        Home Medications    Prior  to Admission medications   Medication Sig Start Date End Date Taking? Authorizing Provider  acetaminophen (TYLENOL) 500 MG tablet Take 500 mg by mouth every 6 (six) hours as needed for mild pain, moderate pain, fever or headache.   Yes Historical Provider, MD  aspirin EC 81 MG tablet Take 81 mg by mouth daily.    Yes Historical Provider, MD  furosemide (LASIX) 40 MG tablet Take 40 mg by mouth 2 (two) times daily.    Yes Historical Provider, MD  liver oil-zinc oxide (DESITIN) 40 % ointment Apply 1 application topically 3 (three) times daily. Apply to bed sore   Yes Historical Provider, MD  metoprolol succinate (TOPROL-XL) 25 MG 24 hr tablet Take 1 tablet (25 mg total) by mouth daily. 10/10/15  Yes Gladstone Lighter, MD  oxyCODONE (OXY IR/ROXICODONE) 5 MG immediate release tablet Take 5 mg by mouth every 4 (four) hours.  03/28/16  Yes Historical Provider, MD  pantoprazole (PROTONIX) 40 MG tablet Take 40 mg by mouth daily.    Yes Historical Provider, MD  spironolactone (ALDACTONE) 25 MG tablet Take 25 mg by mouth 2 (two) times daily.    Yes Historical Provider, MD  warfarin (COUMADIN) 5 MG tablet Take 1 tablet (5 mg total) by mouth daily. 10/10/15  Yes Gladstone Lighter, MD  enoxaparin (LOVENOX) 80 MG/0.8ML injection Inject 0.8 mLs (80 mg total) into the skin every 12 (twelve) hours. Patient not taking: Reported on 04/10/2016 04/04/16   Katha Cabal, MD    Family History Family History  Problem Relation Age of Onset  . Stroke Mother   . Renal Disease Father     Social History Social History  Substance Use Topics  . Smoking status: Former Smoker    Packs/day: 1.00    Years: 27.00    Types: Cigarettes  . Smokeless tobacco: Never Used     Comment: "quit smoking ~ 1980  . Alcohol use 0.0 oz/week     Comment: 04/10/2016 "I'll have a drink on holidays/special occasions"     Allergies   Ace inhibitors; Hydrocodone; Mercury; Nickel; Silver; Cephalexin; and Penicillins   Review of  Systems Review of Systems  ROS reviewed and all otherwise negative except for mentioned in HPI   Physical Exam Updated Vital Signs BP 108/62 (BP Location: Right Arm)   Pulse (!) 55   Temp 98 F (36.7 C) (Oral)   Resp 16   Ht 5\' 6"  (1.676 m)   Wt 73.8 kg   SpO2 98%   BMI 26.26 kg/m  Vitals reviewed Physical Exam Physical Examination: General appearance - alert, well appearing, and in no distress Mental status - alert, oriented to person, place, and time Eyes - no conjunctival injection no scleral icterus Mouth - mucous  membranes moist, pharynx normal without lesions Neck - supple, no significant adenopathy Chest - clear to auscultation, no wheezes, rales or rhonchi, symmetric air entry Heart - normal rate, regular rhythm, normal S1, S2, no murmurs, rubs, clicks or gallops Abdomen - soft, nontender, nondistended, no masses or organomegaly Back exam -ttp over thoracic and lumbar pain Neurological - alert, oriented, normal speech, strength 5/5 in extremities Musculoskeletal - no joint tenderness, deformity or swelling Extremities - peripheral pulses normal, no pedal edema, no clubbing or cyanosis Skin - normal coloration and turgor, no rashes  ED Treatments / Results  Labs (all labs ordered are listed, but only abnormal results are displayed) Labs Reviewed  CBC - Abnormal; Notable for the following:       Result Value   RDW 21.9 (*)    All other components within normal limits  BASIC METABOLIC PANEL - Abnormal; Notable for the following:    Chloride 100 (*)    BUN 26 (*)    Creatinine, Ser 1.75 (*)    GFR calc non Af Amer 27 (*)    GFR calc Af Amer 31 (*)    All other components within normal limits  PROTIME-INR - Abnormal; Notable for the following:    Prothrombin Time 21.1 (*)    All other components within normal limits  BASIC METABOLIC PANEL - Abnormal; Notable for the following:    BUN 23 (*)    Creatinine, Ser 1.65 (*)    GFR calc non Af Amer 29 (*)    GFR  calc Af Amer 33 (*)    All other components within normal limits  CBC - Abnormal; Notable for the following:    RDW 21.7 (*)    All other components within normal limits  PROTIME-INR - Abnormal; Notable for the following:    Prothrombin Time 21.3 (*)    All other components within normal limits  CBC - Abnormal; Notable for the following:    RDW 21.7 (*)    All other components within normal limits  BASIC METABOLIC PANEL - Abnormal; Notable for the following:    Glucose, Bld 102 (*)    BUN 21 (*)    Creatinine, Ser 1.57 (*)    GFR calc non Af Amer 30 (*)    GFR calc Af Amer 35 (*)    All other components within normal limits  PROTIME-INR - Abnormal; Notable for the following:    Prothrombin Time 19.9 (*)    All other components within normal limits  HEPATIC FUNCTION PANEL - Abnormal; Notable for the following:    Total Protein 5.6 (*)    Albumin 3.1 (*)    Alkaline Phosphatase 171 (*)    All other components within normal limits  CBC - Abnormal; Notable for the following:    RDW 21.5 (*)    All other components within normal limits  PROTIME-INR - Abnormal; Notable for the following:    Prothrombin Time 17.4 (*)    All other components within normal limits  BRAIN NATRIURETIC PEPTIDE - Abnormal; Notable for the following:    B Natriuretic Peptide 283.3 (*)    All other components within normal limits  COMPREHENSIVE METABOLIC PANEL - Abnormal; Notable for the following:    BUN 24 (*)    Creatinine, Ser 1.45 (*)    Total Protein 5.8 (*)    Albumin 3.2 (*)    Alkaline Phosphatase 187 (*)    GFR calc non Af Amer 34 (*)    GFR  calc Af Amer 39 (*)    All other components within normal limits  SURGICAL PCR SCREEN  HEPARIN LEVEL (UNFRACTIONATED)  HEPARIN LEVEL (UNFRACTIONATED)  HEPARIN LEVEL (UNFRACTIONATED)  HEPARIN LEVEL (UNFRACTIONATED)  TSH  GAMMA GT  HEPARIN LEVEL (UNFRACTIONATED)  CBC  PROTIME-INR    EKG  EKG Interpretation None       Radiology Dg Ribs  Unilateral Right  Result Date: 04/12/2016 CLINICAL DATA:  81 year old female status post fall several weeks ago. Right side rib pain. T4, T6, and T7 vertebral body fractures noted by MRI in September. Subsequent encounter. EXAM: RIGHT RIBS - 2 VIEW COMPARISON:  Chest radiographs 10/10/2015 and earlier. Thoracic spine MRI 03/21/2016 FINDINGS: Right rib marker placed at the right lateral ninth or tenth rib level near the costochondral junctions. No displaced right rib fracture identified. Questionable nondisplaced right posterior lateral ninth rib fracture (arrow) Osteopenia. No right pneumothorax or pleural effusion. Subsegmental right lung base atelectasis. Stable visible cardiomegaly and mediastinal contours status post prior sternotomy and cardiac valve replacement. IMPRESSION: 1. Osteopenia. Questionable nondisplaced right posterior lateral ninth rib fracture. No displaced right rib fracture identified. 2. Subsegmental atelectasis at the right lung base. Electronically Signed   By: Genevie Ann M.D.   On: 04/12/2016 16:07   US Abdomen Limited Ruq  Result Date: 04/13/2016 CLINICAL DATA:  Abdominal pain for 1 week. EXAM: US ABDOMEN LIMITED - RIGHT UPPER QUADRANT COMPARISON:  None. FINDINGS: Gallbladder: Layering sludge and multiple stones identified in the lumen. Gallbladder wall thickness upper normal at 3 mm. No pericholecystic fluid in the sonographer reports no sonographic Murphy sign. Common bile duct: Diameter: 5 mm Liver: 7 mm hypoechoic lesion inferior liver, likely a cyst. IMPRESSION: Cholelithiasis. Electronically Signed   By: Misty Stanley M.D.   On: 04/13/2016 08:50    Procedures Procedures (including critical care time)  Medications Ordered in ED Medications  liver oil-zinc oxide (DESITIN) 40 % ointment 1 application (1 application Topical Given 04/13/16 1830)  oxyCODONE (Oxy IR/ROXICODONE) immediate release tablet 5 mg (5 mg Oral Given 04/13/16 1830)  metoprolol succinate (TOPROL-XL) 24  hr tablet 25 mg (25 mg Oral Given 04/13/16 1046)  acetaminophen (TYLENOL) tablet 500 mg (500 mg Oral Given 04/12/16 1657)  pantoprazole (PROTONIX) EC tablet 40 mg (40 mg Oral Given 04/13/16 1046)  sodium chloride flush (NS) 0.9 % injection 3 mL (3 mLs Intravenous Not Given 04/13/16 1047)  sodium chloride flush (NS) 0.9 % injection 3 mL (not administered)  0.9 %  sodium chloride infusion (not administered)  sodium phosphate (FLEET) 7-19 GM/118ML enema 1 enema (not administered)  ondansetron (ZOFRAN) tablet 4 mg (not administered)    Or  ondansetron (ZOFRAN) injection 4 mg (not administered)  HYDROmorphone (DILAUDID) injection 0.5-1 mg (0.5 mg Intravenous Given 04/12/16 1510)  heparin ADULT infusion 100 units/mL (25000 units/236mL sodium chloride 0.45%) (1,100 Units/hr Intravenous New Bag/Given 04/13/16 1340)  feeding supplement (ENSURE ENLIVE) (ENSURE ENLIVE) liquid 237 mL (237 mLs Oral Not Given 04/13/16 1500)  vancomycin (VANCOCIN) IVPB 1000 mg/200 mL premix (not administered)  furosemide (LASIX) tablet 40 mg (40 mg Oral Given 04/13/16 1830)  mineral oil enema 1 enema (not administered)  senna-docusate (Senokot-S) tablet 1 tablet (not administered)  polyethylene glycol (MIRALAX / GLYCOLAX) packet 17 g (not administered)  bisacodyl (DULCOLAX) suppository 10 mg (not administered)  HYDROmorphone (DILAUDID) injection 1 mg (1 mg Intravenous Given 04/10/16 1801)  ondansetron (ZOFRAN) injection 4 mg (4 mg Intravenous Given 04/10/16 1801)  HYDROmorphone (DILAUDID) injection 0.5 mg (0.5 mg Intravenous Given  04/10/16 2005)  bisacodyl (DULCOLAX) suppository 10 mg (10 mg Rectal Given 04/13/16 1343)     Initial Impression / Assessment and Plan / ED Course  I have reviewed the triage vital signs and the nursing notes.  Pertinent labs & imaging results that were available during my care of the patient were reviewed by me and considered in my medical decision making (see chart for details).  Clinical  Course  4:50 PM family member states that [facility] from Dr. Patrecia Pour came to the ED on their arrival to see patient.  I have called IR and spoken to numerous people and left voicemail for Anderson Malta to find out what their plan is.  In the meantime we are checking basic labs and giving pain control to patient.    5:17 PM corey, tech from IR has talked to [place]- plan was that if patient can be admitted for pain control by hospitalist service then IR will work on planning the procedure, otherwise will continue to plan as outpatient.  Will see how patient does after dilaudid.    7:08 PM d/w Dr. Myna Hidalgo, triad for admission.  Pt to go to med/surg bed.  He states he will see patient now and write the orders.    Final Clinical Impressions(s) / ED Diagnoses   Final diagnoses:  Compression fracture of vertebra, initial encounter ([place])  Drug-induced constipation    New Prescriptions Current Discharge Medication List       Alfonzo Beers, MD 04/13/16 1926

## 2016-04-10 NOTE — ED Notes (Signed)
Pt to xray

## 2016-04-10 NOTE — ED Notes (Signed)
[name] son st's pt was sent here for admission for pain control.  Pt st's she has fx's in her back and is having pain in right rib area.  Pt also c/o constipation

## 2016-04-10 NOTE — ED Notes (Signed)
Wheeled the patient to the bathroom taking to bed placed on the monitor into a gown

## 2016-04-10 NOTE — Progress Notes (Signed)
ANTICOAGULATION CONSULT NOTE - Initial Consult  Pharmacy Consult for Heparin Indication: bridge  Allergies  Allergen Reactions  . Ace Inhibitors     Other reaction(s): Cough  . Hydrocodone Itching and Nausea Only  . Mercury     Other reaction(s): Unknown  . Nickel   . Silver Dermatitis    Severe itching  . Cephalexin Rash  . Penicillins Rash    Has patient had a PCN reaction causing immediate rash, facial/tongue/throat swelling, SOB or lightheadedness with hypotension: YES Has patient had a PCN reaction causing severe rash involving mucus membranes or skin necrosis: NO Has patient had a PCN reaction that required hospitalization NO Has patient had a PCN reaction occurring within the last 10 years: NO If all of the above answers are "NO", then may proceed with Cephalosporin use.    Patient Measurements: 70.3 kg  Vital Signs: Temp: 97.4 F (36.3 C) (10/11 1408) Temp Source: Oral (10/11 1408) BP: 108/81 (10/11 2000) Pulse Rate: 55 (10/11 2000)  Labs:  Recent Labs  04/10/16 1658  HGB 13.3  HCT 41.6  PLT 221  LABPROT 21.1*  INR 1.79  CREATININE 1.75*    Estimated Creatinine Clearance: 26.7 mL/min (by C-G formula based on SCr of 1.75 mg/dL (H)).   Medical History: Past Medical History:  Diagnosis Date  . Arthritis   . Chronic atrial fibrillation ([place])    a. On Coumadin  . Chronic combined systolic (congestive) and diastolic (congestive) heart failure    a. EF 25% by echo in 08/2015 b. RHC in 08/2015 showed normal filling pressures  . Compression fracture   . GERD (gastroesophageal reflux disease)   . Hypertension   . S/P MVR (mitral valve replacement)    a. MVR 1994 b. redo MVR in 08/2014 - on Coumadin  . Shortness of breath dyspnea     Assessment: 81 year old female on Coumadin PTA for MVR / afib to begin heparin while Coumadin is hold for possible kyphoplasty Admit INR = 1.79 (dose PTA = 5 mg po daily)  Goal of Therapy:  Heparin level 0.3-0.7  units/ml Monitor platelets by anticoagulation protocol: Yes   Plan:  No bolus due to INR of 1.79 Heparin drip at 1100 units / hr Daily heparin level, CBC  Thank you Anette Guarneri, PharmD [phone]  Tad Moore 04/10/2016,8:38 PM

## 2016-04-10 NOTE — ED Notes (Signed)
Attempted report 

## 2016-04-10 NOTE — H&P (Signed)
History and Physical    [name] WRU:[id number] DOB: 1935-02-24 DOA: 04/10/2016  PCP: Rusty Aus, MD   Patient coming from: Home  Chief Complaint: Low back pain   HPI: [name] is a 81 y.o. female with medical history significant for mitral valve replacement on Coumadin, chronic diastolic CHF, GERD, and hypertension who presents to the emergency department for evaluation of severe pain in the mid and low back. Patient reports suffering a mechanical fall at home approximately 3 weeks ago, and another fall approximately one week ago. She has had severe pain in the mid and lower back since that time, but denies numbness or weakness in the lower extremities, fevers or chills, incontinence, or saddle anesthesia. There was no loss of consciousness or head strike with these falls and she had been evaluated with CT, and later MRI of the lumbar and thoracic spine. Imaging revealed compression fractures of L3, L4, L5, T4, T6, and possibly T7. Other chronic vertebral compression fractures were also noted. Patient has been suffering with severe pain secondary to these injuries despite treatment at home with oxycodone. She has been evaluated by interventional radiology and there was plan for possible kyphoplasty. She presents today due to severe uncontrolled pain despite aggressive treatment with oxycodone at home.  ED Course: Upon arrival to the ED, patient is found to be afebrile, saturating well on room air, and with vital signs stable. EKG demonstrates sinus rhythm with intraventricular conduction delay and LVH with secondary repolarization abnormality. Chemistry panel features a serum creatinine 1.75 which appears to be up from her baseline 1.5. CBC is unremarkable and INR is subtherapeutic at 1.79. Patient was treated with multiple doses of Dilaudid and Zofran in the emergency department but continued to be in severe pain. She was evaluated by IR in the ED and admission was advised for  inpatient procedure. Patient remained hemodynamically stable in the ED and in no respiratory distress. She'll be admitted to the medical-surgical unit for ongoing evaluation and management of severe intractable pain secondary to acute/subacute vertebral compression fractures.  Review of Systems:  All other systems reviewed and apart from HPI, are negative.  Past Medical History:  Diagnosis Date  . Arthritis   . Chronic atrial fibrillation ([place])    a. On Coumadin  . Chronic combined systolic (congestive) and diastolic (congestive) heart failure    a. EF 25% by echo in 08/2015 b. RHC in 08/2015 showed normal filling pressures  . Compression fracture   . GERD (gastroesophageal reflux disease)   . Hypertension   . S/P MVR (mitral valve replacement)    a. MVR 1994 b. redo MVR in 08/2014 - on Coumadin  . Shortness of breath dyspnea     Past Surgical History:  Procedure Laterality Date  . BREAST SURGERY Right 2005?   benign lump excision  . CARDIAC CATHETERIZATION    . [place], 2016  . CATARACT EXTRACTION W/PHACO Right 02/20/2015   Procedure: CATARACT EXTRACTION PHACO AND INTRAOCULAR LENS PLACEMENT (IOC);  Surgeon: Estill Cotta, MD;  Location: [facility] ORS;  Service: Ophthalmology;  Laterality: Right;  US01:31.2 AP 25.3% CDE40.13 Fluid lot # [id number]  . ELECTROPHYSIOLOGIC STUDY N/A 10/09/2015   Procedure: CARDIOVERSION;  Surgeon: Wellington Hampshire, MD;  Location: [facility] ORS;  Service: Cardiovascular;  Laterality: N/A;  . IRRIGATION AND DEBRIDEMENT HEMATOMA Left 07/05/2015   Procedure: IRRIGATION AND DEBRIDEMENT HEMATOMA;  Surgeon: Robert Bellow, MD;  Location: [facility] ORS;  Service: General;  Laterality: Left;  .  pyloric stenosis    . US ECHOCARDIOGRAPHY       reports that she has quit smoking. She has never used smokeless tobacco. She reports that she does not drink alcohol or use drugs.  Allergies  Allergen Reactions  . Ace Inhibitors     Other reaction(s):  Cough  . Hydrocodone Itching and Nausea Only  . Mercury     Other reaction(s): Unknown  . Nickel   . Silver Dermatitis    Severe itching  . Cephalexin Rash  . Penicillins Rash    Has patient had a PCN reaction causing immediate rash, facial/tongue/throat swelling, SOB or lightheadedness with hypotension: YES Has patient had a PCN reaction causing severe rash involving mucus membranes or skin necrosis: NO Has patient had a PCN reaction that required hospitalization NO Has patient had a PCN reaction occurring within the last 10 years: NO If all of the above answers are "NO", then may proceed with Cephalosporin use.    Family History  Problem Relation Age of Onset  . Stroke Mother   . Renal Disease Father      Prior to Admission medications   Medication Sig Start Date End Date Taking? Authorizing Provider  acetaminophen (TYLENOL) 500 MG tablet Take 500 mg by mouth every 6 (six) hours as needed for mild pain, moderate pain, fever or headache.   Yes Historical Provider, MD  aspirin EC 81 MG tablet Take 81 mg by mouth daily.    Yes Historical Provider, MD  furosemide (LASIX) 40 MG tablet Take 40 mg by mouth 2 (two) times daily.    Yes Historical Provider, MD  liver oil-zinc oxide (DESITIN) 40 % ointment Apply 1 application topically 3 (three) times daily. Apply to bed sore   Yes Historical Provider, MD  metoprolol succinate (TOPROL-XL) 25 MG 24 hr tablet Take 1 tablet (25 mg total) by mouth daily. 10/10/15  Yes Gladstone Lighter, MD  oxyCODONE (OXY IR/ROXICODONE) 5 MG immediate release tablet Take 5 mg by mouth every 4 (four) hours.  03/28/16  Yes Historical Provider, MD  pantoprazole (PROTONIX) 40 MG tablet Take 40 mg by mouth daily.    Yes Historical Provider, MD  spironolactone (ALDACTONE) 25 MG tablet Take 25 mg by mouth 2 (two) times daily.    Yes Historical Provider, MD  warfarin (COUMADIN) 5 MG tablet Take 1 tablet (5 mg total) by mouth daily. 10/10/15  Yes Gladstone Lighter, MD    enoxaparin (LOVENOX) 80 MG/0.8ML injection Inject 0.8 mLs (80 mg total) into the skin every 12 (twelve) hours. Patient not taking: Reported on 04/10/2016 04/04/16   Katha Cabal, MD    Physical Exam: Vitals:   04/10/16 1908 04/10/16 1930 04/10/16 1945 04/10/16 2000  BP: (!) 84/68 120/65 115/61 108/81  Pulse: (!) 56 (!) 55 (!) 57 (!) 55  Resp: 16 16 13 11   Temp:      TempSrc:      SpO2: 98% 99% 95% 94%      Constitutional: NAD, calm, in apparent discomfort  Eyes: PERTLA, lids and conjunctivae normal ENMT: Mucous membranes are moist. Posterior pharynx clear of any exudate or lesions.   Neck: normal, supple, no masses, no thyromegaly Respiratory: clear to auscultation bilaterally, no wheezing, no crackles. Normal respiratory effort.    Cardiovascular: S1 & S2 heard, regular rate and rhythm. No carotid bruits. No significant JVD. Abdomen: No distension, no tenderness, no masses palpated. Bowel sounds normal.  Musculoskeletal: no clubbing / cyanosis. Bony tenderness along mid-thoracic and lumbar spine.  LE strength 5/5 b/l and sensation to light touch intact in b/l feet. Normal muscle tone.  Skin: no significant rashes, lesions, ulcers. Warm, dry, well-perfused. Neurologic: CN 2-12 grossly intact. Sensation intact, DTR normal. Strength 5/5 in all 4 limbs.  Psychiatric: Normal judgment and insight. Alert and oriented x 3. Normal mood and affect.     Labs on Admission: I have personally reviewed following labs and imaging studies  CBC:  Recent Labs Lab 04/10/16 1658  WBC 4.8  HGB 13.3  HCT 41.6  MCV 83.0  PLT 268   Basic Metabolic Panel:  Recent Labs Lab 04/10/16 1658  NA 136  K 3.6  CL 100*  CO2 25  GLUCOSE 95  BUN 26*  CREATININE 1.75*  CALCIUM 9.7   GFR: Estimated Creatinine Clearance: 26.7 mL/min (by C-G formula based on SCr of 1.75 mg/dL (H)). Liver Function Tests: No results for input(s): AST, ALT, ALKPHOS, BILITOT, PROT, ALBUMIN in the last 168  hours. No results for input(s): LIPASE, AMYLASE in the last 168 hours. No results for input(s): AMMONIA in the last 168 hours. Coagulation Profile:  Recent Labs Lab 04/10/16 1658  INR 1.79   Cardiac Enzymes: No results for input(s): CKTOTAL, CKMB, CKMBINDEX, TROPONINI in the last 168 hours. BNP (last 3 results) No results for input(s): PROBNP in the last 8760 hours. HbA1C: No results for input(s): HGBA1C in the last 72 hours. CBG: No results for input(s): GLUCAP in the last 168 hours. Lipid Profile: No results for input(s): CHOL, HDL, LDLCALC, TRIG, CHOLHDL, LDLDIRECT in the last 72 hours. Thyroid Function Tests: No results for input(s): TSH, T4TOTAL, FREET4, T3FREE, THYROIDAB in the last 72 hours. Anemia Panel: No results for input(s): VITAMINB12, FOLATE, FERRITIN, TIBC, IRON, RETICCTPCT in the last 72 hours. Urine analysis:    Component Value Date/Time   COLORURINE YELLOW (A) 04/28/2015 1421   APPEARANCEUR CLEAR (A) 04/28/2015 1421   APPEARANCEUR Hazy 03/22/2012 1151   LABSPEC 1.009 04/28/2015 1421   LABSPEC 1.043 03/22/2012 1151   PHURINE 6.0 04/28/2015 1421   GLUCOSEU NEGATIVE 04/28/2015 1421   GLUCOSEU Negative 03/22/2012 1151   HGBUR NEGATIVE 04/28/2015 1421   BILIRUBINUR NEGATIVE 04/28/2015 1421   BILIRUBINUR Negative 03/22/2012 1151   KETONESUR NEGATIVE 04/28/2015 1421   PROTEINUR NEGATIVE 04/28/2015 1421   NITRITE NEGATIVE 04/28/2015 1421   LEUKOCYTESUR NEGATIVE 04/28/2015 1421   LEUKOCYTESUR Negative 03/22/2012 1151   Sepsis Labs: @LABRCNTIP (procalcitonin:4,lacticidven:4) )No results found for this or any previous visit (from the past 240 hour(s)).   Radiological Exams on Admission: Dg Abdomen 1 View  Result Date: 04/10/2016 CLINICAL DATA:  Back and right-sided abdominal pain.  Constipation. EXAM: ABDOMEN - 1 VIEW COMPARISON:  CT 03/22/2012 FINDINGS: Nonobstructive bowel gas pattern. Moderate amount of stool throughout the lower abdomen and pelvis. Again  noted is a calcified uterine fibroid. Limited evaluation for free air. Again noted is a compression fracture at L4. IMPRESSION: Normal bowel gas pattern.  Moderate stool burden. Electronically Signed   By: Markus Daft M.D.   On: 04/10/2016 18:42    EKG: Independently reviewed. Sinus rhythm, IVCD, LVH with secondary repolarization abnormality.   Assessment/Plan  1. Multiple compression fractures with intractable pain  - Pt has suffered 2 mechanical falls at home in the last mo and has been essentially bed-bound with severe pain despite treatment with oxycodone at home - She has been evaluated by IR for possible procedure and will need to have warfarin held for this  - Admit to med-surg, control pain, hold  warfarin, start heparin infusion - IR is following and much appreciated; they are planning for inpt procedure   2. Hx of mitral valve replacement  - Stable, managed with warfarin  - INR subtherapeutic at 1.79 on presentation  - Hold warfarin and start heparin infusion given plan for inpatient IR intervention on her spine    3. CKD stage III  - SCr 1.75 on admission, up from apparent baseline of ~1.5  - Plan to hold Lasix and Aldactone on admission as creatinine is slightly up and she appears dry; resume as appropriate   4. Chronic systolic CHF  - Appears a little dry on admission  - TTE (10/02/15) with EF 40-45%, moderate TR - Managed with Lasix, Aldactone, Toprol at home - Diuretics held on admission as she appears dry and has slight bump in SCr; resume as appropriate - Follow daily wts and I/O's; continue Toprol     DVT prophylaxis: heparin infusion  Code Status: Full  Family Communication: Children updated at bedside at pt's request Disposition Plan: Admit to med-surg  Consults called: IR Admission status: Inpatient    Vianne Bulls, MD [facility] Pager [phone]  If 7PM-7AM, please contact night-coverage [email or web address] Password [email or web address]  04/10/2016, 8:35 PM

## 2016-04-10 NOTE — ED Triage Notes (Signed)
Pt and family member reports pt having compression fractures of her spine and is waiting for procedure to be scheduled in IR. Pt having severe right side back pain. No relief with oxycodone at home, has caused constipation and no bowel movement in one week.

## 2016-04-11 ENCOUNTER — Encounter (HOSPITAL_COMMUNITY): Payer: Self-pay | Admitting: Interventional Radiology

## 2016-04-11 DIAGNOSIS — E440 Moderate protein-calorie malnutrition: Secondary | ICD-10-CM | POA: Diagnosis present

## 2016-04-11 LAB — BASIC METABOLIC PANEL
ANION GAP: 8 (ref 5–15)
BUN: 23 mg/dL — ABNORMAL HIGH (ref 6–20)
CALCIUM: 9.4 mg/dL (ref 8.9–10.3)
CO2: 29 mmol/L (ref 22–32)
CREATININE: 1.65 mg/dL — AB (ref 0.44–1.00)
Chloride: 101 mmol/L (ref 101–111)
GFR, EST AFRICAN AMERICAN: 33 mL/min — AB (ref >60)
GFR, EST NON AFRICAN AMERICAN: 29 mL/min — AB (ref >60)
Glucose, Bld: 88 mg/dL (ref 65–99)
Potassium: 3.5 mmol/L (ref 3.5–5.1)
SODIUM: 138 mmol/L (ref 135–145)

## 2016-04-11 LAB — CBC
HCT: 39.9 % (ref 36.0–46.0)
Hemoglobin: 12.7 g/dL (ref 12.0–15.0)
MCH: 26.5 pg (ref 26.0–34.0)
MCHC: 31.8 g/dL (ref 30.0–36.0)
MCV: 83.3 fL (ref 78.0–100.0)
PLATELETS: 209 10*3/uL (ref 150–400)
RBC: 4.79 MIL/uL (ref 3.87–5.11)
RDW: 21.7 % — AB (ref 11.5–15.5)
WBC: 4.7 10*3/uL (ref 4.0–10.5)

## 2016-04-11 LAB — HEPARIN LEVEL (UNFRACTIONATED)
HEPARIN UNFRACTIONATED: 0.41 [IU]/mL (ref 0.30–0.70)
Heparin Unfractionated: 0.37 IU/mL (ref 0.30–0.70)

## 2016-04-11 LAB — PROTIME-INR
INR: 1.82
Prothrombin Time: 21.3 seconds — ABNORMAL HIGH (ref 11.4–15.2)

## 2016-04-11 MED ORDER — POLYETHYLENE GLYCOL 3350 17 G PO PACK
17.0000 g | PACK | Freq: Every day | ORAL | Status: DC
  Administered 2016-04-11 – 2016-04-13 (×3): 17 g via ORAL
  Filled 2016-04-11 (×3): qty 1

## 2016-04-11 MED ORDER — FUROSEMIDE 40 MG PO TABS
40.0000 mg | ORAL_TABLET | Freq: Every day | ORAL | Status: DC
  Administered 2016-04-11 – 2016-04-12 (×2): 40 mg via ORAL
  Filled 2016-04-11 (×2): qty 1

## 2016-04-11 NOTE — Progress Notes (Signed)
ANTICOAGULATION CONSULT NOTE - Follow Up Consult  Pharmacy Consult:  Heparin Indication:  Afib / MVR  Allergies  Allergen Reactions  . Ace Inhibitors     Other reaction(s): Cough  . Hydrocodone Itching and Nausea Only  . Mercury     Other reaction(s): Unknown  . Nickel   . Silver Dermatitis    Severe itching  . Cephalexin Rash  . Penicillins Rash    Has patient had a PCN reaction causing immediate rash, facial/tongue/throat swelling, SOB or lightheadedness with hypotension: YES Has patient had a PCN reaction causing severe rash involving mucus membranes or skin necrosis: NO Has patient had a PCN reaction that required hospitalization NO Has patient had a PCN reaction occurring within the last 10 years: NO If all of the above answers are "NO", then may proceed with Cephalosporin use.    Patient Measurements: Height: 5\' 6"  (167.6 cm) Weight: 162 lb 0.6 oz (73.5 kg) IBW/kg (Calculated) : 59.3 Heparin Dosing Weight: 73 kg  Vital Signs: Temp: 98.1 F (36.7 C) (10/12 0426) Temp Source: Oral (10/12 0426) BP: 130/73 (10/12 0426) Pulse Rate: 58 (10/12 0426)  Labs:  Recent Labs  04/10/16 1658 04/11/16 0440 04/11/16 1129  HGB 13.3 12.7  --   HCT 41.6 39.9  --   PLT 221 209  --   LABPROT 21.1*  --   --   INR 1.79  --   --   HEPARINUNFRC  --  0.37 0.41  CREATININE 1.75* 1.65*  --     Estimated Creatinine Clearance: 28.8 mL/min (by C-G formula based on SCr of 1.65 mg/dL (H)).    Assessment: 73 YOF with history of Afib and MVR to continue on IV heparin while Coumadin is on hold for possible kyphoplasty.  Heparin level is therapeutic; no bleeding reported.   Goal of Therapy:  Heparin level 0.3-0.7 units/ml Monitor platelets by anticoagulation protocol: Yes    Plan:  - Continue heparin gtt at 1100 units/hr - Daily heparin level and CBC   Willia Genrich D. Mina Marble, PharmD, BCPS Pager:  [phone] 04/11/2016, 12:07 PM

## 2016-04-11 NOTE — Consult Note (Signed)
Chief Complaint: Patient was seen in consultation today for Thoracic 4 and Thoracic 6 and Sacral 1 vertebroplasty/kyphoplasty Chief Complaint  Patient presents with  . Back Pain  . Constipation   at the request of Dr Rudolpho Sevin  Referring Physician(s): Dr Niel Hummer  Supervising Physician: Luanne Bras  Patient Status: [facility] - In-pt  History of Present Illness: [name] is a 81 y.o. female   Pt was seen in consult Oct 2 with Dr Estanislado Pandy regarding horrible back pain [place] at home Continued pain No real relief with medication MRI 9/21: IMPRESSION: Acute or subacute compression fractures of T4 and T6 with marked vertebral body height loss at both levels. Mild bony retropulsion is identified at both levels without causing central canal or foraminal stenosis. Marrow edema in the superior endplate of T7 without vertebral body height loss is compatible with microfracture.  IMPRESSION: Acute or subacute compression fractures of the inferior endplates of L3 and L4 and superior endplate of L5. Intense marrow edema in the left L5 pedicle is present where there is likely nondisplaced fracture present. Acute or subacute right sacral fracture is also Identified.  The T4, T6 and the sacral insufficiency fractures will be treated first. Thereafter, the L2, L4 and L5 fractures will be treated  Pt now inpatient for pain control and bridging off coumadin INR 1.8 today Need to be 1.5 or lower to safely proceed Off coumadin now day 3 Heparin infusion bridge  Insurance is being pre certified now--- as pt is now an Inpatient  Past Medical History:  Diagnosis Date  . Arthritis    "back, hands, knees" (04/10/2016)  . Chronic atrial fibrillation ([place])    a. On Coumadin  . Chronic back pain   . Chronic combined systolic (congestive) and diastolic (congestive) heart failure    a. EF 25% by echo in 08/2015 b. RHC in 08/2015 showed normal filling pressures  .  Compression fracture    "several; all in my back" (04/10/2016)  . GERD (gastroesophageal reflux disease)   . Heart murmur   . Hypertension   . S/P MVR (mitral valve replacement)    a. MVR 1994 b. redo MVR in 08/2014 - on Coumadin  . Shortness of breath dyspnea     Past Surgical History:  Procedure Laterality Date  . BREAST LUMPECTOMY Right 2005?   benign lump excision  . CARDIAC CATHETERIZATION    . [place], 2016   "MVR; MVR"  . CATARACT EXTRACTION W/PHACO Right 02/20/2015   Procedure: CATARACT EXTRACTION PHACO AND INTRAOCULAR LENS PLACEMENT (IOC);  Surgeon: Estill Cotta, MD;  Location: [facility] ORS;  Service: Ophthalmology;  Laterality: Right;  US01:31.2 AP 25.3% CDE40.13 Fluid lot # [id number]  . ELECTROPHYSIOLOGIC STUDY N/A 10/09/2015   Procedure: CARDIOVERSION;  Surgeon: Wellington Hampshire, MD;  Location: [facility] ORS;  Service: Cardiovascular;  Laterality: N/A;  . IR GENERIC HISTORICAL  04/01/2016   IR RADIOLOGIST EVAL & MGMT 04/01/2016 [facility] RAD  . IRRIGATION AND DEBRIDEMENT HEMATOMA Left 07/05/2015   Procedure: IRRIGATION AND DEBRIDEMENT HEMATOMA;  Surgeon: Robert Bellow, MD;  Location: [facility] ORS;  Service: General;  Laterality: Left;  . pyloric stenosis  07/1937  . US ECHOCARDIOGRAPHY      Allergies: Ace inhibitors; Hydrocodone; Mercury; Nickel; Silver; Cephalexin; and Penicillins  Medications: Prior to Admission medications   Medication Sig Start Date End Date Taking? Authorizing Provider  acetaminophen (TYLENOL) 500 MG tablet Take 500 mg by mouth every 6 (six) hours as needed  for mild pain, moderate pain, fever or headache.   Yes Historical Provider, MD  aspirin EC 81 MG tablet Take 81 mg by mouth daily.    Yes Historical Provider, MD  furosemide (LASIX) 40 MG tablet Take 40 mg by mouth 2 (two) times daily.    Yes Historical Provider, MD  liver oil-zinc oxide (DESITIN) 40 % ointment Apply 1 application topically 3 (three) times daily. Apply to bed  sore   Yes Historical Provider, MD  metoprolol succinate (TOPROL-XL) 25 MG 24 hr tablet Take 1 tablet (25 mg total) by mouth daily. 10/10/15  Yes Gladstone Lighter, MD  oxyCODONE (OXY IR/ROXICODONE) 5 MG immediate release tablet Take 5 mg by mouth every 4 (four) hours.  03/28/16  Yes Historical Provider, MD  pantoprazole (PROTONIX) 40 MG tablet Take 40 mg by mouth daily.    Yes Historical Provider, MD  spironolactone (ALDACTONE) 25 MG tablet Take 25 mg by mouth 2 (two) times daily.    Yes Historical Provider, MD  warfarin (COUMADIN) 5 MG tablet Take 1 tablet (5 mg total) by mouth daily. 10/10/15  Yes Gladstone Lighter, MD  enoxaparin (LOVENOX) 80 MG/0.8ML injection Inject 0.8 mLs (80 mg total) into the skin every 12 (twelve) hours. Patient not taking: Reported on 04/10/2016 04/04/16   Katha Cabal, MD     Family History  Problem Relation Age of Onset  . Stroke Mother   . Renal Disease Father     Social History   Social History  . Marital status: Widowed    Spouse name: N/A  . Number of children: N/A  . Years of education: N/A   Social History Main Topics  . Smoking status: Former Smoker    Packs/day: 1.00    Years: 27.00    Types: Cigarettes  . Smokeless tobacco: Never Used     Comment: "quit smoking ~ 1980  . Alcohol use 0.0 oz/week     Comment: 04/10/2016 "I'll have a drink on holidays/special occasions"  . Drug use: No  . Sexual activity: No   Other Topics Concern  . None   Social History Narrative  . None    Review of Systems: A 12 point ROS discussed and pertinent positives are indicated in the HPI above.  All other systems are negative.  Review of Systems  Constitutional: Positive for activity change, appetite change, fatigue and unexpected weight change. Negative for fever.  Respiratory: Negative for shortness of breath.   Gastrointestinal: Positive for abdominal pain.  Musculoskeletal: Positive for back pain and gait problem.  Neurological: Positive for  weakness.  Psychiatric/Behavioral: Negative for behavioral problems and confusion.    Vital Signs: BP 130/73 (BP Location: Right Arm)   Pulse (!) 58   Temp 98.1 F (36.7 C) (Oral)   Resp 16   Ht 5\' 6"  (1.676 m)   Wt 162 lb 0.6 oz (73.5 kg)   SpO2 97%   BMI 26.15 kg/m   Physical Exam  Constitutional: She is oriented to person, place, and time.  Cardiovascular: Normal rate.   Pulmonary/Chest: Effort normal and breath sounds normal. She has no wheezes.  Abdominal: Soft. Bowel sounds are normal. There is tenderness.  Musculoskeletal: Normal range of motion. She exhibits tenderness.  Neurological: She is alert and oriented to person, place, and time.  Skin: Skin is warm and dry.  Psychiatric: She has a normal mood and affect. Her behavior is normal. Judgment and thought content normal.  Nursing note and vitals reviewed.   Mallampati Score:  MD Evaluation Airway: WNL Heart: WNL Abdomen: WNL Chest/ Lungs: WNL ASA  Classification: 3 Mallampati/Airway Score: One  Imaging: Dg Abdomen 1 View  Result Date: 04/10/2016 CLINICAL DATA:  Back and right-sided abdominal pain.  Constipation. EXAM: ABDOMEN - 1 VIEW COMPARISON:  CT 03/22/2012 FINDINGS: Nonobstructive bowel gas pattern. Moderate amount of stool throughout the lower abdomen and pelvis. Again noted is a calcified uterine fibroid. Limited evaluation for free air. Again noted is a compression fracture at L4. IMPRESSION: Normal bowel gas pattern.  Moderate stool burden. Electronically Signed   By: Markus Daft M.D.   On: 04/10/2016 18:42   Ct Thoracic Spine Wo Contrast  Result Date: 03/18/2016 CLINICAL DATA:  81 year old female with thoracic compression fractures. Mid thoracic pain. Subsequent encounter. EXAM: CT THORACIC SPINE WITHOUT CONTRAST TECHNIQUE: Multidetector CT imaging of the thoracic spine was performed without intravenous contrast administration. Multiplanar CT image reconstructions were also generated. COMPARISON:   Report of [facility] thoracic radiographs 02/21/2016 (no images available). Chest radiographs 10/10/2015 and earlier. FINDINGS: Thoracic segmentation appears to be normal.  Diffuse osteopenia. Alignment: Relatively preserved thoracic kyphosis. Moderate to severe T4 and T6 compression fractures which are partially sclerotic, especially the former. Loss of height at these levels is up to 50%. Mild retropulsion of bone at both levels with maintained spinal canal AP dimension of 10 mm or greater. Mild compression of the T5, T9 and T10 vertebral bodies, primarily affecting the superior endplates. Loss of height is more pronounced at T9 and up to 35%. Only minimal retropulsion of bone at these levels. Small chronic appearing endplate deformities adjacent to the T1-T2 disc which may be Schmorl nodes. Vertebrae: Other thoracic levels appear intact. No displaced posterior rib fracture identified. Paraspinal and other soft tissues: Visualized major airways are patent. There is respiratory motion artifact and atelectasis at the lung bases greater on the left. No pleural effusion. Calcified aortic atherosclerosis. Calcified coronary artery atherosclerosis. Partially visible cardiomegaly with prosthetic mitral valve. Negative visualized upper abdominal viscera. Negative visualized posterior paraspinal soft tissues. Disc levels: Mild for age lumbar spine degeneration. No CT evidence of thoracic spinal stenosis. IMPRESSION: 1. Mild age indeterminate compression fractures of T5, T9 and T10. Moderate to severe more chronic appearing compression fractures of T4 and T6. If specific therapy such as vertebroplasty is desired, Thoracic MRI or Nuclear Medicine Whole-body Bone Scan would best determine acuity. 2. Osteopenia. No other acute osseous abnormality identified in the thoracic spine. 3. Mild for age superimposed thoracic spine degeneration. No spinal stenosis. 4. Calcified coronary artery and Calcified aortic atherosclerosis.  Electronically Signed   By: Genevie Ann M.D.   On: 03/18/2016 15:13   Ct Lumbar Spine Wo Contrast  Result Date: 03/18/2016 CLINICAL DATA:  81 year old female with thoracic compression fractures. Mid thoracic pain. Subsequent encounter. EXAM: CT LUMBAR SPINE WITHOUT CONTRAST TECHNIQUE: Multidetector CT imaging of the lumbar spine was performed without intravenous contrast administration. Multiplanar CT image reconstructions were also generated. COMPARISON:  Thoracic spine CT from today reported separately. CT Abdomen and Pelvis 03/22/2012. FINDINGS: Segmentation: In conjunction with the thoracic spine CT today reported separately lumbar segmentation is normal. Alignment: Moderate to severe compression fracture of L4 with loss of height up to or slightly greater than 50%. Mild compression fractures of L2, L3, and L5. Of these, the L4 and L5 fractures are most sclerotic. Superimposed grade 1 anterolisthesis of L3 on L4 and L4 on L5. No pars fractures. Severe superimposed lumbar facet degeneration from L2 to the sacrum. Vertebrae: T12 and  L1 intact. Osteopenia. Abnormal sclerosis and periosteal reaction along the right sacral ala in keeping with insufficiency fracture (series 4, image 105). Small left superior sacral ala insufficiency fracture which is less apparent and might be more acute (series 4, image 96). SI joints remain normal. Paraspinal and other soft tissues: Calcified aortic atherosclerosis. Chronic cholelithiasis. Individual gallstones up to nearly 3 cm diameter. No acute posterior paraspinal soft tissue findings. Disc levels: Mild multifactorial spinal stenosis at L3-L4. IMPRESSION: 1. Bilateral sacral insufficiency fractures, more extensive on the right but possibly more acute on the left. 2. Lumbar compression fractures of L2 through L5, also age indeterminate. 3. In light of these findings as well as the thoracic CT findings today (reported separately) recommend Nuclear Medicine Whole-body Bone Scan to  evaluate the acuity of these fractures further. 4. Superimposed degenerative spine of lumbar spondylolisthesis with widespread severe facet arthropathy. Mild multifactorial spinal stenosis at L3-L4. 5.  Calcified aortic atherosclerosis. 6. Bulky cholelithiasis. Electronically Signed   By: Genevie Ann M.D.   On: 03/18/2016 15:24   Mr Thoracic Spine Wo Contrast  Result Date: 03/21/2016 CLINICAL DATA:  Pain from the neck to the waist and bilateral hip pain for 2 months since a fall. EXAM: MRI THORACIC SPINE WITHOUT CONTRAST TECHNIQUE: Multiplanar, multisequence MR imaging of the thoracic spine was performed. No intravenous contrast was administered. COMPARISON:  CT thoracic spine 03/18/2016. FINDINGS: Alignment: No listhesis. Mild exaggeration of thoracic kyphosis centered about T4 is noted. Vertebrae: Severe T4 and T6 compression fractures are identified with associated marrow edema, more notable in T6. Vertebral body height loss of T4-T6 is estimated at up to 80-90% There is also marrow edema in the superior endplate of T7 without compression compatible with microfracturing. Remote compression fractures of T5, T9 and T10 are identified. Cord:  Normal signal throughout. Paraspinal and other soft tissues: Unremarkable. Disc levels: A few small disc protrusions are identified without central canal stenosis. Mild bony retropulsion off the mid aspect of T4 and T6 is also seen but does not cause central canal or foraminal stenosis. IMPRESSION: Acute or subacute compression fractures of T4 and T6 with marked vertebral body height loss at both levels. Mild bony retropulsion is identified at both levels without causing central canal or foraminal stenosis. Marrow edema in the superior endplate of T7 without vertebral body height loss is compatible with microfracture. Remote compression fractures at T5, T9 and T10. Electronically Signed   By: Inge Rise M.D.   On: 03/21/2016 10:42   Mr Lumbar Spine Wo Contrast  Result  Date: 03/21/2016 CLINICAL DATA:  Back pain from the neck to the waist with bilateral hip pain. History of fall 2 months ago. EXAM: MRI LUMBAR SPINE WITHOUT CONTRAST TECHNIQUE: Multiplanar, multisequence MR imaging of the lumbar spine was performed. No intravenous contrast was administered. COMPARISON:  CT lumbar spine 03/18/2016. FINDINGS: Segmentation:  Unremarkable. Alignment: Facet degenerative disease results in 0.5 cm anterolisthesis L3 on L4. Vertebrae: Biconcave compression fracture of L3 is identified with marrow edema in the inferior endplate. Vertebral body height loss of up to 30% centrally is identified. There is also a biconcave compression fracture of L4 with marrow edema in the inferior endplate. Vertebral body height loss is estimated at 60%. Marrow edema in the superior endplate of L5 is seen where there is a mild compression fracture with vertebral body height loss of approximately 20%. Remote L3 compression fracture is identified. There is marrow edema in the right sacrum consistent with insufficiency fracture. Conus medullaris: Extends  to the T12-L1 level and appears normal. Paraspinal and other soft tissues: Multiple gallstones are identified. Largest stone measures 3 cm in diameter. Disc levels: T12-L1:  Negative. L1-2: Minimal disc bulge without central canal or foraminal stenosis. L2-3: Ligamentum flavum thickening and a shallow disc bulge are identified. The central canal and foramina remain open. L3-4: The disc is uncovered with a shallow bulge. There is facet arthropathy and some ligamentum flavum thickening. The central spinal canal and foramina remain open. L4-5: Minimal disc bulge is seen and there is facet arthropathy. Mild subarticular recess narrowing on the left is identified without nerve root compression. The foramina are open. Marked marrow edema is present in the left L5 pedicle where there is likely a nondisplaced fracture. Mild marrow edema in the right L4 and L5 pedicles is  compatible with stress change without fracture. L5-S1: Partial autologous fusion across the disc interspace is identified. No bulge or protrusion. The central canal and foramina are open. IMPRESSION: Acute or subacute compression fractures of the inferior endplates of L3 and L4 and superior endplate of L5. Intense marrow edema in the left L5 pedicle is present where there is likely nondisplaced fracture present. Acute or subacute right sacral fracture is also identified. Remote L3 compression fracture. Overall mild degenerative disease without central canal or foraminal stenosis. Mild subarticular recess narrowing on the left at L4-5 without nerve root compression is noted. Gallstones. Electronically Signed   By: Inge Rise M.D.   On: 03/21/2016 10:33   US Venous Img Lower Unilateral Left  Result Date: 03/21/2016 CLINICAL DATA:  Left leg swelling for 6 day EXAM: LEFT LOWER EXTREMITY VENOUS DUPLEX ULTRASOUND TECHNIQUE: Doppler venous assessment of the left lower extremity deep venous system was performed, including characterization of spectral flow, compressibility, and phasicity. COMPARISON:  None. FINDINGS: There is complete compressibility of the left common femoral, femoral, and popliteal veins. Doppler analysis demonstrates respiratory phasicity and augmentation of flow with calf compression. No obvious superficial vein or calf vein thrombosis. IMPRESSION: No evidence of left lower extremity DVT. Electronically Signed   By: Marybelle Killings M.D.   On: 03/21/2016 16:24   Ir Radiologist Eval & Mgmt  Result Date: 04/11/2016 EXAM: NEW PATIENT OFFICE VISIT CHIEF COMPLAINT: Severe thoracic and lumbosacral pain due to multiple compression fractures in the thoracic and lumbar regions. The patient also has sacral insufficiency fractures. Current Pain Level: 1-10 HISTORY OF PRESENT ILLNESS: The patient is a 81 year old right-handed lady who has been referred for evaluation and treatment of severe pain in the  thoracolumbar and sacral regions subsequent to a fall which she incurred towards the end of July. The patient is accompanied by her daughter and her son. The patient reports pain developing severe in intensity 2 weeks after a fall. She describes this pain as a 12 out of 10 in terms of pain intensity. The pain is usually relieved with a heating pad or lying still. The pain apparently is in the mid thoracic region and also in the entire lumbar spine and along the sacroiliac complex. The patient is ambulatory with a walker though barely. Prior to the fall the patient was ambulatory independently and driving intermittently and taking care of herself. She lives by herself. At the present time her pain is relieved with oxycodone which she takes every 6 hours. However, this has resulted in her being significantly slowed cognitively and motor wise. Also, this results in her lethargy and also loss of appetite and constipation. Her appetite is low and she has  lost about 10-15 pounds since the onset of these symptoms. She denies any symptoms of autonomic dysfunction of her bowel or bladder. She denies any symptoms of dysuria, frequency of micturition or hematuria. The patient is mostly limited to a reclining chair much of the day since this fall with subsequent discovery of multiple thoracic and lumbar sacral fractures. The pain is significantly increased with prolonged standing or stooping. Past Medical History: Congestive heart failure. Patient is post MVR. Generalized osteoarthritis. History of clostridium difficile infection. Mitral valve replacement and mechanical bowel surgery in May of 2015. Hyperlipidemia. History of subacute bacterial endocarditis in 1994. Past Surgical History: Removal of lump from the breast. Reoperation for changes of mitral valve. The replacement of her mitral valve was in March of 2016. Medications: Acetaminophen. Pacerone. Ascorbic acid. Aspirin. Furosemide. Metolazone. Metoprolol XL.  Multivitamin tablets. Protonix. MiraLax. Potassium chloride supplementation. Spironolactone. Tramadol stopped. Coumadin. Valium. Hydrocodone 5/325. Allergies: Ace inhibitors which cause coughing. Hydrocodone causes itching and nausea. Keflex causes a rash. Mercury unknown. Nickel unknown. Penicillins cause a rash. Silver causes dermatitis, severe itching. Social History: Patient widowed. Has a son and a daughter. Denies smoking cigarettes or drinking alcohol. Denies use of illicit chemicals. Family History: Mother deceased at age 41 unknown cause. Father deceased at age 4 unknown cause. History of heart problems, high blood pressure, kidney disease and stroke in the family. REVIEW OF SYSTEMS: Apart from as mentioned above, largely the review of systems is unremarkable. She does have easy bruisability because of the fact that she is on Coumadin. Also the patient reports swelling of her ankles because of her decreased mobility, due to the recent fractures as described above. She reportedly has not documented DVT. The remainder of her review systems is as mentioned above. PHYSICAL EXAMINATION: On brief examination, the patient appears to be in distress crash forward because of the pain. Neurologically she is alert, awake and oriented to time, place, space. No lateralizing abnormal cranial nerve, motor, sensory or coordination difficulties. Station and gait not tested. Patient exquisitely tender in the mid thoracic spine and also in the lower lumbar spine. ASSESSMENT AND PLAN: The patient's recent MRI scan of the lumbar and thoracic spine, and the CT of the thoracic and lumbar spine were reviewed. These depict the presence of acute to subacute compression fractures at T4 and T6 with signal abnormalities along the superior endplate at T7 which may represent contusion versus mild superior endplate fracture. A small retropulsion noted at T4 and at T6. Superior endplate compression fracture is seen noted at T5, inferior  endplate at T4 with loss of approximately 50% vertebral body height and at L2 along the inferior end plate with mild loss of the vertebral body height. Multilevel degenerate disc disease is noted most notably at L3-L4, L4-L5. The MRI and CT also depict the sacral insufficiency fractures more prominent on the right side but more recent possibly on the left side. The option of vertebral body augmentation at these fracture sites to alleviate pain, and to prevent further collapse of vertebral bodies and also to lessen or significantly reduce the need for opioid pain killers was reviewed in detail. The procedure of vertebroplasty/sacroplasty or kyphoplasty were reviewed at the levels examined. It was felt that the patient would benefit significantly from vertebral body augmentations at T4, T6 and also of the sacral insufficiency fractures to began with. Following that, should the patient's symptoms persist in the lumbosacral region, vertebral body augmentation would be performed at L2, L4 and L5. The procedure, the  risks, the benefits and the alternatives were all reviewed in detail. The T4, T6 and the sacral insufficiency fractures will be treated first. Thereafter, the L2, L4 and L5 fractures will be treated. The patient will need to come off the Coumadin for least 5 days prior to the procedure and started on bridging with Lovenox subcutaneously, INR will be drawn on the 6th day following the stoppage of the Coumadin. If within normal limits, the patient will undergo the procedure at the thoracic levels and in the sacrum followed perhaps a week to 2 weeks later treatment of the L2, L4 and L5 levels. Questions were answered to their satisfaction. The patient and the family would like to proceed with the procedures as described. These will be scheduled at the earliest possible with the above plans. The patient, her daughter and son leave with good understanding and agreement with the above management plan. Electronically  Signed   By: Luanne Bras M.D.   On: 04/09/2016 18:34    Labs:  CBC:  Recent Labs  10/08/15 0432 03/21/16 1507 04/10/16 1658 04/11/16 0440  WBC 5.4 8.1 4.8 4.7  HGB 11.1* 14.5 13.3 12.7  HCT 34.6* 43.6 41.6 39.9  PLT 149* 245 221 209    COAGS:  Recent Labs  10/10/15 0443 03/21/16 1507 04/10/16 1658 04/11/16 1129  INR 4.06* 6.86* 1.79 1.82    BMP:  Recent Labs  10/09/15 0501 03/21/16 1507 04/10/16 1658 04/11/16 0440  NA 135 135 136 138  K 3.7 4.7 3.6 3.5  CL 103 102 100* 101  CO2 27 25 25 29   GLUCOSE 104* 123* 95 88  BUN 35* 48* 26* 23*  CALCIUM 8.6* 9.6 9.7 9.4  CREATININE 1.46* 1.47* 1.75* 1.65*  GFRNONAA 33* 33* 27* 29*  GFRAA 39* 38* 31* 33*    LIVER FUNCTION TESTS:  Recent Labs  10/02/15 0506  ALBUMIN 4.0    TUMOR MARKERS: No results for input(s): AFPTM, CEA, CA199, CHROMGRNA in the last 8760 hours.  Assessment and Plan:  Multiple spinal fractures secondary fall at home Consulted with Dr Estanislado Pandy 10/2 Plan for first staged VP/KP of T4;T6; S1 L2; L4; L5 - later date Risks and Benefits discussed with the patient including, but not limited to education regarding the natural healing process of compression fractures without intervention, bleeding, infection, cement migration which may cause spinal cord damage, paralysis, pulmonary embolism or even death. All of the patient's questions were answered, patient is agreeable to proceed. Consent signed and in chart.  Thank you for this interesting consult.  I greatly enjoyed meeting [name] and look forward to participating in their care.  A copy of this report was sent to the requesting provider on this date.  Electronically Signed: Aira Sallade A 04/11/2016, 2:39 PM   I spent a total of 40 Minutes    in face to face in clinical consultation, greater than 50% of which was counseling/coordinating care for T4/T6/S1 VP/KP

## 2016-04-11 NOTE — Progress Notes (Signed)
Initial Nutrition Assessment  DOCUMENTATION CODES:   Non-severe (moderate) malnutrition in context of chronic illness  INTERVENTION:  Continue Ensure Enlive po BID, each supplement provides 350 kcal and 20 grams of protein.  Encourage adequate PO intake.   NUTRITION DIAGNOSIS:   Malnutrition related to chronic illness as evidenced by moderate depletion of body fat, moderate depletions of muscle mass, percent weight loss.  GOAL:   Patient will meet greater than or equal to 90% of their needs  MONITOR:   PO intake, Labs, Weight trends, Supplement acceptance, Skin, I & O's  REASON FOR ASSESSMENT:   Malnutrition Screening Tool    ASSESSMENT:   81 y.o. female with medical history significant for mitral valve replacement on Coumadin, chronic diastolic CHF, GERD, and hypertension who presents to the emergency department for evaluation of severe pain in the mid and low back. revealed compression fractures of L3, L4, L5, T4, T6, and possibly T7. Other chronic vertebral compression fractures were also noted. Patient has been suffering with severe pain secondary to these injuries despite treatment at home with oxycodone. She has been evaluated by interventional radiology and there was plan for possible kyphoplasty.   Meal completion 75%. Pt reports having a decreased appetite which has been ongoing over the past 1 week. Pt reports however still consuming 3 meals a day which mostly consists of baked chicken and salad. Usual body weight reported to be ~170 lbs. Per Epic weight records, pt with a 10.9% weight loss in 6 months. Pt currently has Ensure ordered and has been consuming them. RD to continue with current orders.   Nutrition-Focused physical exam completed. Findings are moderate fat depletion, moderate muscle depletion, and mild edema.   Labs and medications reviewed.   Diet Order:  Diet regular Room service appropriate? Yes; Fluid consistency: Thin Diet NPO time specified Except  for: Sips with Meds  Skin:  Wound (see comment) (wound on leg)  Last BM:  10/10  Height:   Ht Readings from Last 1 Encounters:  04/10/16 5\' 6"  (1.676 m)    Weight:   Wt Readings from Last 1 Encounters:  04/11/16 162 lb 0.6 oz (73.5 kg)    Ideal Body Weight:  59 kg  BMI:  Body mass index is 26.15 kg/m.  Estimated Nutritional Needs:   Kcal:  1800-1950  Protein:  80-90 grams  Fluid:  1.8 - 1.9 L/day  EDUCATION NEEDS:   No education needs identified at this time  Corrin Parker, MS, RD, LDN Pager # [phone] After hours/ weekend pager # [phone]

## 2016-04-11 NOTE — Progress Notes (Signed)
PROGRESS NOTE    [name]  CWC:[id number] DOB: [name] 31, 1935 DOA: 04/10/2016 PCP: Rusty Aus, MD    Brief Narrative: [name] is a 81 y.o. female with medical history significant for mitral valve replacement on Coumadin, chronic diastolic CHF, GERD, and hypertension who presents to the emergency department for evaluation of severe pain in the mid and low back. Patient reports suffering a mechanical fall at home approximately 3 weeks ago, and another fall approximately one week ago. She has had severe pain in the mid and lower back since that time, but denies numbness or weakness in the lower extremities, fevers or chills, incontinence, or saddle anesthesia. There was no loss of consciousness or head strike with these falls and she had been evaluated with CT, and later MRI of the lumbar and thoracic spine. Imaging revealed compression fractures of L3, L4, L5, T4, T6, and possibly T7. Other chronic vertebral compression fractures were also noted. Patient has been suffering with severe pain secondary to these injuries despite treatment at home with oxycodone. She has been evaluated by interventional radiology and there was plan for possible kyphoplasty. She presents today due to severe uncontrolled pain despite aggressive treatment with oxycodone at home.    Assessment & Plan:   Principal Problem:   Vertebral compression fracture (HCC) Active Problems:   Chronic diastolic CHF (congestive heart failure) (HCC)   Long term current use of anticoagulant therapy   H/O mitral valve replacement with mechanical valve   CKD (chronic kidney disease), stage III   Intractable pain   Spinal compression fracture ([place])   1. Multiple compression fractures with intractable pain  - Pt has suffered 2 mechanical falls at home in the last mo and has been essentially bed-bound with severe pain despite treatment with oxycodone at home - She has been evaluated by IR outpatient. for possible  procedure and will need to have warfarin held for this  - IR is following and much appreciated; they are planning for inpt procedure   2. Hx of mitral valve replacement  - Stable, managed with warfarin  - INR subtherapeutic at 1.79 on presentation  - Holding  warfarin for possible procedure.  -Continue with heparin.  -cardio consult.   3. CKD stage III  - SCr 1.75 on admission, up from apparent baseline of ~1.4--1.6 - resume lasix. Hold spironolactone.  -follow renal function daily.   4. Chronic systolic CHF  - TTE (01/04/15) with EF 40-45%, moderate TR - Managed with Lasix, Aldactone, Toprol at home - Follow daily wts and I/O's; continue Toprol   -resume lower dose home  lasix , crackles on lung exam.  -hold spironolactone due to increase in cr.  -cardio consult to help with medical management.   DVT prophylaxis: heparin  Code Status: full code.  Family Communication: daughter at bedside.  Disposition Plan: remain inpatient.    Consultants:   IR  Cardiology    Procedures:   none   Antimicrobials: none   Subjective: No BM yet. Denies abdominal pain  No dyspnea.   Objective: Vitals:   04/10/16 1945 04/10/16 2000 04/10/16 2053 04/11/16 0426  BP: 115/61 108/81 (!) 129/55 130/73  Pulse: (!) 57 (!) 55 (!) 58 (!) 58  Resp: 13 11 16 16   Temp:   98.1 F (36.7 C) 98.1 F (36.7 C)  TempSrc:   Oral Oral  SpO2: 95% 94% 94% 97%  Weight:   72.4 kg (159 lb 9.8 oz) 73.5 kg (162 lb 0.6 oz)  Height:   5\' 6"  (1.676 m)     Intake/Output Summary (Last 24 hours) at 04/11/16 1129 Last data filed at 04/11/16 0900  Gross per 24 hour  Intake              590 ml  Output                0 ml  Net              590 ml   Filed Weights   04/10/16 2053 04/11/16 0426  Weight: 72.4 kg (159 lb 9.8 oz) 73.5 kg (162 lb 0.6 oz)    Examination:  General exam: Appears calm and comfortable  Respiratory system: Bilateral crackles. Respiratory effort normal. Cardiovascular system:  S1 & S2 heard, RRR. No JVD, murmurs, rubs, gallops or clicks. No pedal edema. Gastrointestinal system: Abdomen is nondistended, soft and nontender. No organomegaly or masses felt. Normal bowel sounds heard. Central nervous system: Alert and oriented. No focal neurological deficits. Extremities: Symmetric 5 x 5 power. Skin: No rashes, lesions or ulcers Psychiatry: Judgement and insight appear normal. Mood & affect appropriate.     Data Reviewed: I have personally reviewed following labs and imaging studies  CBC:  Recent Labs Lab 04/10/16 1658 04/11/16 0440  WBC 4.8 4.7  HGB 13.3 12.7  HCT 41.6 39.9  MCV 83.0 83.3  PLT 221 283   Basic Metabolic Panel:  Recent Labs Lab 04/10/16 1658 04/11/16 0440  NA 136 138  K 3.6 3.5  CL 100* 101  CO2 25 29  GLUCOSE 95 88  BUN 26* 23*  CREATININE 1.75* 1.65*  CALCIUM 9.7 9.4   GFR: Estimated Creatinine Clearance: 28.8 mL/min (by C-G formula based on SCr of 1.65 mg/dL (H)). Liver Function Tests: No results for input(s): AST, ALT, ALKPHOS, BILITOT, PROT, ALBUMIN in the last 168 hours. No results for input(s): LIPASE, AMYLASE in the last 168 hours. No results for input(s): AMMONIA in the last 168 hours. Coagulation Profile:  Recent Labs Lab 04/10/16 1658  INR 1.79   Cardiac Enzymes: No results for input(s): CKTOTAL, CKMB, CKMBINDEX, TROPONINI in the last 168 hours. BNP (last 3 results) No results for input(s): PROBNP in the last 8760 hours. HbA1C: No results for input(s): HGBA1C in the last 72 hours. CBG: No results for input(s): GLUCAP in the last 168 hours. Lipid Profile: No results for input(s): CHOL, HDL, LDLCALC, TRIG, CHOLHDL, LDLDIRECT in the last 72 hours. Thyroid Function Tests: No results for input(s): TSH, T4TOTAL, FREET4, T3FREE, THYROIDAB in the last 72 hours. Anemia Panel: No results for input(s): VITAMINB12, FOLATE, FERRITIN, TIBC, IRON, RETICCTPCT in the last 72 hours. Sepsis Labs: No results for input(s):  PROCALCITON, LATICACIDVEN in the last 168 hours.  No results found for this or any previous visit (from the past 240 hour(s)).       Radiology Studies: Dg Abdomen 1 View  Result Date: 04/10/2016 CLINICAL DATA:  Back and right-sided abdominal pain.  Constipation. EXAM: ABDOMEN - 1 VIEW COMPARISON:  CT 03/22/2012 FINDINGS: Nonobstructive bowel gas pattern. Moderate amount of stool throughout the lower abdomen and pelvis. Again noted is a calcified uterine fibroid. Limited evaluation for free air. Again noted is a compression fracture at L4. IMPRESSION: Normal bowel gas pattern.  Moderate stool burden. Electronically Signed   By: Markus Daft M.D.   On: 04/10/2016 18:42        Scheduled Meds: . docusate sodium  100 mg Oral BID  . feeding supplement (ENSURE ENLIVE)  237 mL Oral BID BM  . liver oil-zinc oxide  1 application Topical TID  . metoprolol succinate  25 mg Oral Daily  . pantoprazole  40 mg Oral Daily  . sodium chloride flush  3 mL Intravenous Q12H   Continuous Infusions: . heparin 1,100 Units/hr (04/10/16 2210)     LOS: 1 day    Time spent: 35 minutes.     Elmarie Shiley, MD [facility] Pager [phone]  If 7PM-7AM, please contact night-coverage [email or web address] Password [email or web address] 04/11/2016, 11:29 AM

## 2016-04-11 NOTE — Progress Notes (Signed)
ANTICOAGULATION CONSULT NOTE - Follow Up Consult  Pharmacy Consult for Heparin (while warfarin on hold) Indication: MVR/Afib  Allergies  Allergen Reactions  . Ace Inhibitors     Other reaction(s): Cough  . Hydrocodone Itching and Nausea Only  . Mercury     Other reaction(s): Unknown  . Nickel   . Silver Dermatitis    Severe itching  . Cephalexin Rash  . Penicillins Rash    Has patient had a PCN reaction causing immediate rash, facial/tongue/throat swelling, SOB or lightheadedness with hypotension: YES Has patient had a PCN reaction causing severe rash involving mucus membranes or skin necrosis: NO Has patient had a PCN reaction that required hospitalization NO Has patient had a PCN reaction occurring within the last 10 years: NO If all of the above answers are "NO", then may proceed with Cephalosporin use.    Patient Measurements: Height: 5\' 6"  (167.6 cm) Weight: 162 lb 0.6 oz (73.5 kg) IBW/kg (Calculated) : 59.3  Vital Signs: Temp: 98.1 F (36.7 C) (10/12 0426) Temp Source: Oral (10/12 0426) BP: 130/73 (10/12 0426) Pulse Rate: 58 (10/12 0426)  Labs:  Recent Labs  04/10/16 1658 04/11/16 0440  HGB 13.3 12.7  HCT 41.6 39.9  PLT 221 209  LABPROT 21.1*  --   INR 1.79  --   HEPARINUNFRC  --  0.37  CREATININE 1.75*  --     Estimated Creatinine Clearance: 27.2 mL/min (by C-G formula based on SCr of 1.75 mg/dL (H)).   Assessment: Heparin while warfarin on hold for procedure, initial heparin level is therapeutic   Goal of Therapy:  Heparin level 0.3-0.7 units/ml Monitor platelets by anticoagulation protocol: Yes   Plan:  -Cont heparin 1100 units/hr -1200 HL  [name] 04/11/2016,5:16 AM

## 2016-04-12 ENCOUNTER — Inpatient Hospital Stay (HOSPITAL_COMMUNITY): Payer: Medicare Other

## 2016-04-12 DIAGNOSIS — M4850XA Collapsed vertebra, not elsewhere classified, site unspecified, initial encounter for fracture: Secondary | ICD-10-CM

## 2016-04-12 DIAGNOSIS — R52 Pain, unspecified: Secondary | ICD-10-CM

## 2016-04-12 DIAGNOSIS — L8990 Pressure ulcer of unspecified site, unspecified stage: Secondary | ICD-10-CM | POA: Insufficient documentation

## 2016-04-12 DIAGNOSIS — I5042 Chronic combined systolic (congestive) and diastolic (congestive) heart failure: Secondary | ICD-10-CM

## 2016-04-12 LAB — CBC
HCT: 38.4 % (ref 36.0–46.0)
HEMOGLOBIN: 12.3 g/dL (ref 12.0–15.0)
MCH: 26.7 pg (ref 26.0–34.0)
MCHC: 32 g/dL (ref 30.0–36.0)
MCV: 83.3 fL (ref 78.0–100.0)
PLATELETS: 200 10*3/uL (ref 150–400)
RBC: 4.61 MIL/uL (ref 3.87–5.11)
RDW: 21.7 % — AB (ref 11.5–15.5)
WBC: 4.7 10*3/uL (ref 4.0–10.5)

## 2016-04-12 LAB — BASIC METABOLIC PANEL
Anion gap: 10 (ref 5–15)
BUN: 21 mg/dL — AB (ref 6–20)
CALCIUM: 9.2 mg/dL (ref 8.9–10.3)
CHLORIDE: 102 mmol/L (ref 101–111)
CO2: 27 mmol/L (ref 22–32)
CREATININE: 1.57 mg/dL — AB (ref 0.44–1.00)
GFR calc non Af Amer: 30 mL/min — ABNORMAL LOW (ref >60)
GFR, EST AFRICAN AMERICAN: 35 mL/min — AB (ref >60)
Glucose, Bld: 102 mg/dL — ABNORMAL HIGH (ref 65–99)
Potassium: 3.6 mmol/L (ref 3.5–5.1)
SODIUM: 139 mmol/L (ref 135–145)

## 2016-04-12 LAB — HEPATIC FUNCTION PANEL
ALK PHOS: 171 U/L — AB (ref 38–126)
ALT: 18 U/L (ref 14–54)
AST: 18 U/L (ref 15–41)
Albumin: 3.1 g/dL — ABNORMAL LOW (ref 3.5–5.0)
BILIRUBIN INDIRECT: 0.7 mg/dL (ref 0.3–0.9)
BILIRUBIN TOTAL: 0.9 mg/dL (ref 0.3–1.2)
Bilirubin, Direct: 0.2 mg/dL (ref 0.1–0.5)
TOTAL PROTEIN: 5.6 g/dL — AB (ref 6.5–8.1)

## 2016-04-12 LAB — SURGICAL PCR SCREEN
MRSA, PCR: NEGATIVE
Staphylococcus aureus: NEGATIVE

## 2016-04-12 LAB — PROTIME-INR
INR: 1.68
Prothrombin Time: 19.9 seconds — ABNORMAL HIGH (ref 11.4–15.2)

## 2016-04-12 LAB — HEPARIN LEVEL (UNFRACTIONATED): Heparin Unfractionated: 0.52 IU/mL (ref 0.30–0.70)

## 2016-04-12 MED ORDER — BISACODYL 5 MG PO TBEC
5.0000 mg | DELAYED_RELEASE_TABLET | Freq: Every day | ORAL | Status: DC
  Administered 2016-04-13: 5 mg via ORAL
  Filled 2016-04-12: qty 1

## 2016-04-12 MED ORDER — VANCOMYCIN HCL IN DEXTROSE 1-5 GM/200ML-% IV SOLN
1000.0000 mg | INTRAVENOUS | Status: AC
  Administered 2016-04-15: 1000 mg via INTRAVENOUS
  Filled 2016-04-12: qty 200

## 2016-04-12 MED ORDER — FUROSEMIDE 40 MG PO TABS
40.0000 mg | ORAL_TABLET | Freq: Two times a day (BID) | ORAL | Status: DC
  Administered 2016-04-12 – 2016-04-19 (×13): 40 mg via ORAL
  Filled 2016-04-12 (×14): qty 1

## 2016-04-12 NOTE — Consult Note (Signed)
Reason for Consult:   CHF  Requesting Physician: Dr Tyrell Antonio Primary Cardiologist ? [facility]- was Dr Bonne Dolores  HPI:   81 y/o female from [place] with a history of mechanical MVR in 1994 with re do in March 2016. She has been followed at [facility] until recently when she transferred all her care to her PCP-Dr Sabra Heck at [facility]. The pt has a history of cardiomyopathy (presume this is NICM). Echo April 2017- EF 40-45%. She had PAF in April and was placed on Amiodarone and cardioverted May 2017 to NSR. Dr Haroldine Laws saw her in the CHF clinic May 2017 as a second opinion (?). He notes that a Rt heart cath done in March 2017 at [facility] showed her filling pressures looked good. The pt had LE edema that was ultimately diagnosed as lymphedema. She has seen Dr Carmin Richmond at the Vascular clinic and has done quite well with this using lymphedema pumps.   The pt is admitted now after a fall at home. She has suffered compression fractures and is waiting for her INR to come down so she can have kyphoplasty. The pt does live in her own home in [place]. The pt's daughter relates that the pt has had some increase in her DOE. The pt tells me she no longer goes up stairs. Interestingly her wgt in May 2017 when Dr Haroldine Laws felt she was volume stable was 174. Her wgt here was 159 on admission.   PMHx:  Past Medical History:  Diagnosis Date  . Arthritis    "back, hands, knees" (04/10/2016)  . Chronic atrial fibrillation ([place])    a. On Coumadin  . Chronic back pain   . Chronic combined systolic (congestive) and diastolic (congestive) heart failure    a. EF 25% by echo in 08/2015 b. RHC in 08/2015 showed normal filling pressures  . Compression fracture    "several; all in my back" (04/10/2016)  . GERD (gastroesophageal reflux disease)   . Heart murmur   . Hypertension   . S/P MVR (mitral valve replacement)    a. MVR 1994 b. redo MVR in 08/2014 - on Coumadin  . Shortness of breath  dyspnea     Past Surgical History:  Procedure Laterality Date  . BREAST LUMPECTOMY Right 2005?   benign lump excision  . CARDIAC CATHETERIZATION    . [place], 2016   "MVR; MVR"  . CATARACT EXTRACTION W/PHACO Right 02/20/2015   Procedure: CATARACT EXTRACTION PHACO AND INTRAOCULAR LENS PLACEMENT (IOC);  Surgeon: Estill Cotta, MD;  Location: [facility] ORS;  Service: Ophthalmology;  Laterality: Right;  US01:31.2 AP 25.3% CDE40.13 Fluid lot # [id number]  . ELECTROPHYSIOLOGIC STUDY N/A 10/09/2015   Procedure: CARDIOVERSION;  Surgeon: Wellington Hampshire, MD;  Location: [facility] ORS;  Service: Cardiovascular;  Laterality: N/A;  . IR GENERIC HISTORICAL  04/01/2016   IR RADIOLOGIST EVAL & MGMT 04/01/2016 [facility]-INTERV RAD  . IRRIGATION AND DEBRIDEMENT HEMATOMA Left 07/05/2015   Procedure: IRRIGATION AND DEBRIDEMENT HEMATOMA;  Surgeon: Robert Bellow, MD;  Location: [facility] ORS;  Service: General;  Laterality: Left;  . pyloric stenosis  07/1935  . US ECHOCARDIOGRAPHY      SOCHx:  reports that she has quit smoking. Her smoking use included Cigarettes. She has a 27.00 pack-year smoking history. She has never used smokeless tobacco. She reports that she drinks alcohol. She reports that she does not use drugs.  FAMHx: Family History  Problem Relation Age of Onset  .  Stroke Mother   . Renal Disease Father     ALLERGIES: Allergies  Allergen Reactions  . Ace Inhibitors     Other reaction(s): Cough  . Hydrocodone Itching and Nausea Only  . Mercury     Other reaction(s): Unknown  . Nickel   . Silver Dermatitis    Severe itching  . Cephalexin Rash  . Penicillins Rash    Has patient had a PCN reaction causing immediate rash, facial/tongue/throat swelling, SOB or lightheadedness with hypotension: YES Has patient had a PCN reaction causing severe rash involving mucus membranes or skin necrosis: NO Has patient had a PCN reaction that required hospitalization NO Has patient had a PCN  reaction occurring within the last 10 years: NO If all of the above answers are "NO", then may proceed with Cephalosporin use.    ROS: Review of Systems: General: negative for chills, fever, night sweats or weight changes.  Cardiovascular: negative for chest pain, orthopnea, palpitations, paroxysmal nocturnal dyspnea or shortness of breath HEENT: negative for any visual disturbances, blindness, glaucoma Dermatological: negative for rash Respiratory: negative for cough, hemoptysis, or wheezing Urologic: negative for hematuria or dysuria Abdominal: negative for nausea, vomiting, diarrhea, bright red blood per rectum, melena, or hematemesis Neurologic: negative for visual changes, syncope, or dizziness Musculoskeletal: negative for back pain, joint pain, or swelling Psych: cooperative and appropriate All other systems reviewed and are otherwise negative except as noted above.   HOME MEDICATIONS: Prior to Admission medications   Medication Sig Start Date End Date Taking? Authorizing Provider  acetaminophen (TYLENOL) 500 MG tablet Take 500 mg by mouth every 6 (six) hours as needed for mild pain, moderate pain, fever or headache.   Yes Historical Provider, MD  aspirin EC 81 MG tablet Take 81 mg by mouth daily.    Yes Historical Provider, MD  furosemide (LASIX) 40 MG tablet Take 40 mg by mouth 2 (two) times daily.    Yes Historical Provider, MD  liver oil-zinc oxide (DESITIN) 40 % ointment Apply 1 application topically 3 (three) times daily. Apply to bed sore   Yes Historical Provider, MD  metoprolol succinate (TOPROL-XL) 25 MG 24 hr tablet Take 1 tablet (25 mg total) by mouth daily. 10/10/15  Yes Gladstone Lighter, MD  oxyCODONE (OXY IR/ROXICODONE) 5 MG immediate release tablet Take 5 mg by mouth every 4 (four) hours.  03/28/16  Yes Historical Provider, MD  pantoprazole (PROTONIX) 40 MG tablet Take 40 mg by mouth daily.    Yes Historical Provider, MD  spironolactone (ALDACTONE) 25 MG tablet Take  25 mg by mouth 2 (two) times daily.    Yes Historical Provider, MD  warfarin (COUMADIN) 5 MG tablet Take 1 tablet (5 mg total) by mouth daily. 10/10/15  Yes Gladstone Lighter, MD  enoxaparin (LOVENOX) 80 MG/0.8ML injection Inject 0.8 mLs (80 mg total) into the skin every 12 (twelve) hours. Patient not taking: Reported on 04/10/2016 04/04/16   Katha Cabal, MD    HOSPITAL MEDICATIONS: I have reviewed the patient's current medications.  VITALS: Blood pressure (!) 120/57, pulse (!) 52, temperature 98 F (36.7 C), temperature source Oral, resp. rate 15, height 5' 6"  (1.676 m), weight 162 lb 11.2 oz (73.8 kg), SpO2 96 %.  PHYSICAL EXAM: General appearance: alert, cooperative, appears older than stated age, no distress and kyphotic, chronically ill appearing female Neck: no JVD Lungs: bilateral rales 1/3, kyphosis Heart: regular rate and rhythm and 2/6 systolic murmur with intact valve sounds Abdomen: soft, non-tender; bowel sounds normal; no  masses,  no organomegaly Extremities: no ulcers, gangrene or trophic changes, varicose veins noted and trace edema Pulses: 2+ and symmetric Skin: pale, cool, dry Neurologic: Grossly normal  LABS: Results for orders placed or performed during the hospital encounter of 04/10/16 (from the past 24 hour(s))  Surgical pcr screen     Status: None   Collection Time: 04/11/16 11:04 PM  Result Value Ref Range   MRSA, PCR NEGATIVE NEGATIVE   Staphylococcus aureus NEGATIVE NEGATIVE  Heparin level (unfractionated)     Status: None   Collection Time: 04/12/16  6:05 AM  Result Value Ref Range   Heparin Unfractionated 0.52 0.30 - 0.70 IU/mL  CBC     Status: Abnormal   Collection Time: 04/12/16  6:05 AM  Result Value Ref Range   WBC 4.7 4.0 - 10.5 K/uL   RBC 4.61 3.87 - 5.11 MIL/uL   Hemoglobin 12.3 12.0 - 15.0 g/dL   HCT 38.4 36.0 - 46.0 %   MCV 83.3 78.0 - 100.0 fL   MCH 26.7 26.0 - 34.0 pg   MCHC 32.0 30.0 - 36.0 g/dL   RDW 21.7 (H) 11.5 - 15.5 %    Platelets 200 150 - 400 K/uL  Basic metabolic panel     Status: Abnormal   Collection Time: 04/12/16  6:05 AM  Result Value Ref Range   Sodium 139 135 - 145 mmol/L   Potassium 3.6 3.5 - 5.1 mmol/L   Chloride 102 101 - 111 mmol/L   CO2 27 22 - 32 mmol/L   Glucose, Bld 102 (H) 65 - 99 mg/dL   BUN 21 (H) 6 - 20 mg/dL   Creatinine, Ser 1.57 (H) 0.44 - 1.00 mg/dL   Calcium 9.2 8.9 - 10.3 mg/dL   GFR calc non Af Amer 30 (L) >60 mL/min   GFR calc Af Amer 35 (L) >60 mL/min   Anion gap 10 5 - 15  Protime-INR     Status: Abnormal   Collection Time: 04/12/16  7:30 AM  Result Value Ref Range   Prothrombin Time 19.9 (H) 11.4 - 15.2 seconds   INR 1.68     EKG: NSR-61, IVCD  IMAGING: Dg Abdomen 1 View  Result Date: 04/10/2016 CLINICAL DATA:  Back and right-sided abdominal pain.  Constipation. EXAM: ABDOMEN - 1 VIEW COMPARISON:  CT 03/22/2012 FINDINGS: Nonobstructive bowel gas pattern. Moderate amount of stool throughout the lower abdomen and pelvis. Again noted is a calcified uterine fibroid. Limited evaluation for free air. Again noted is a compression fracture at L4. IMPRESSION: Normal bowel gas pattern.  Moderate stool burden. Electronically Signed   By: Markus Daft M.D.   On: 04/10/2016 18:42    IMPRESSION: Principal Problem:   Vertebral compression fracture (HCC) Active Problems:   Chronic combined systolic and diastolic CHF    Long term current use of anticoagulant therapy   H/O mitral valve replacement with mechanical valve   Lymphedema   CKD (chronic kidney disease), stage III   Intractable pain   Spinal compression fracture (HCC)   Malnutrition of moderate degree   RECOMMENDATION: Will discuss with MD- the pt's daughter says they would like to transfer cardiology care to our group.They would like "a fresh pair of eyes" to review the pt's medications.   I suggested the [place] office would make the most sense for OP cardiology f/u.   Difficult to tell if she is volume  overloaded on exam and her history of vague DOE isn't that remarkable. She does have  some rales on exam and hasn't had a CXR since admission, that may be helpful as well as a BNP (559 in March 2017). Will also get aTSH as she has been on Amiodarone, her LFTs were WNL Sept 2017- results in [place]. .   This is the first time I have met this patient but I get the sense she has had gradual but steady decline. It's hard to imagine she will be able to go back to independent living.   Time Spent Directly with Patient: [address] minutes  Kerin Ransom, [place] beeper 04/12/2016, 11:36 AM

## 2016-04-12 NOTE — Progress Notes (Signed)
ANTICOAGULATION CONSULT NOTE - Follow Up Consult  Pharmacy Consult:  Heparin Indication:  Afib / MVR  Allergies  Allergen Reactions  . Ace Inhibitors     Other reaction(s): Cough  . Hydrocodone Itching and Nausea Only  . Mercury     Other reaction(s): Unknown  . Nickel   . Silver Dermatitis    Severe itching  . Cephalexin Rash  . Penicillins Rash    Has patient had a PCN reaction causing immediate rash, facial/tongue/throat swelling, SOB or lightheadedness with hypotension: YES Has patient had a PCN reaction causing severe rash involving mucus membranes or skin necrosis: NO Has patient had a PCN reaction that required hospitalization NO Has patient had a PCN reaction occurring within the last 10 years: NO If all of the above answers are "NO", then may proceed with Cephalosporin use.    Patient Measurements: Height: 5\' 6"  (167.6 cm) Weight: 162 lb 11.2 oz (73.8 kg) IBW/kg (Calculated) : 59.3 Heparin Dosing Weight: 73 kg  Vital Signs: Temp: 98 F (36.7 C) (10/13 0450) Temp Source: Oral (10/13 0450) BP: 120/57 (10/13 0450) Pulse Rate: 52 (10/13 0450)  Labs:  Recent Labs  04/10/16 1658 04/11/16 0440 04/11/16 1129 04/12/16 0605  HGB 13.3 12.7  --  12.3  HCT 41.6 39.9  --  38.4  PLT 221 209  --  200  LABPROT 21.1*  --  21.3*  --   INR 1.79  --  1.82  --   HEPARINUNFRC  --  0.37 0.41 0.52  CREATININE 1.75* 1.65*  --  1.57*    Estimated Creatinine Clearance: 30.4 mL/min (by C-G formula based on SCr of 1.57 mg/dL (H)).  Assessment: 89 YOF with history of Afib and MVR to continue on IV heparin while Coumadin is on hold for vertebroplasty/kyphoplasty when INR <= 1.5.  Heparin level is therapeutic at 0.52; no bleeding reported, CBC stable. INR is pending for today.  Goal of Therapy:  Heparin level 0.3-0.7 units/ml Monitor platelets by anticoagulation protocol: Yes   Plan:  - Continue heparin gtt at 1100 units/hr - Daily heparin level and CBC - Monitor for s/sx of  bleeding   Renold Genta, PharmD, BCPS Clinical Pharmacist Phone for today - [place] - [phone] 04/12/2016 8:34 AM

## 2016-04-12 NOTE — Progress Notes (Signed)
PROGRESS NOTE    [name]  IPJ:[id number] DOB: 06-22-1935 DOA: 04/10/2016 PCP: Rusty Aus, MD    Brief Narrative: [name] is a 81 y.o. female with medical history significant for mitral valve replacement on Coumadin, chronic diastolic CHF, GERD, and hypertension who presents to the emergency department for evaluation of severe pain in the mid and low back. Patient reports suffering a mechanical fall at home approximately 3 weeks ago, and another fall approximately one week ago. She has had severe pain in the mid and lower back since that time, but denies numbness or weakness in the lower extremities, fevers or chills, incontinence, or saddle anesthesia. There was no loss of consciousness or head strike with these falls and she had been evaluated with CT, and later MRI of the lumbar and thoracic spine. Imaging revealed compression fractures of L3, L4, L5, T4, T6, and possibly T7. Other chronic vertebral compression fractures were also noted. Patient has been suffering with severe pain secondary to these injuries despite treatment at home with oxycodone. She has been evaluated by interventional radiology and there was plan for possible kyphoplasty. She presents today due to severe uncontrolled pain despite aggressive treatment with oxycodone at home.    Assessment & Plan:   Principal Problem:   Vertebral compression fracture (HCC) Active Problems:   Chronic diastolic CHF (congestive heart failure) (HCC)   Long term current use of anticoagulant therapy   H/O mitral valve replacement with mechanical valve   CKD (chronic kidney disease), stage III   Intractable pain   Spinal compression fracture (HCC)   Malnutrition of moderate degree   1. Multiple compression fractures with intractable pain  - Pt has suffered 2 mechanical falls at home in the last mo and has been essentially bed-bound with severe pain despite treatment with oxycodone at home - She has been evaluated  by IR outpatient. for possible procedure and will need to have warfarin held for this  - IR is following and much appreciated; they are planning for inpt procedure  -INR still 1.6. Plan to continue with heparin and possible procedure on Monday.   2. Hx of mitral valve replacement  - Stable, managed with warfarin  - INR subtherapeutic at 1.79 on presentation  - Holding  warfarin for possible procedure.  -Continue with heparin.  -cardio consulted.   3. CKD stage III  - SCr 1.75 on admission, up from apparent baseline of ~1.4--1.6 - resume lasix increase to home dose today. . Holding  spironolactone.  -follow renal function daily.   4. Chronic systolic CHF  - TTE (01/01/40) with EF 40-45%, moderate TR - Managed with Lasix, Aldactone, Toprol at home - Follow daily wts and I/O's; continue Toprol   -change lasix to home dose., 40 mg BID.  -holding  spironolactone due to increase in cr.  -cardio consult to help with medical management.  Family would like to change her outpatient cardiologist.   5-Right side chest pain, pain reproduce on palpation of her rib right side.  Check LFT.  Dedicated rib x ray if patient is able to tolerates it.    6-Elevated Alkaline phosphatase.  Check GGT and [place].   DVT prophylaxis: heparin  Code Status: full code.  Family Communication: daughter at bedside.  Disposition Plan: remain inpatient.    Consultants:   IR  Cardiology    Procedures:   none   Antimicrobials: none   Subjective: No BM yet. Denies abdominal pain  No dyspnea.  Report right  upper side chest pain, ribs.   Objective: Vitals:   04/11/16 1500 04/11/16 2040 04/12/16 0450 04/12/16 0658  BP: (!) 118/51 (!) 145/63 (!) 120/57   Pulse: (!) 54 (!) 57 (!) 52   Resp: 16 16 15    Temp: 98.1 F (36.7 C) 97.8 F (36.6 C) 98 F (36.7 C)   TempSrc: Oral Oral Oral   SpO2: 99% 98% 96%   Weight:    73.8 kg (162 lb 11.2 oz)  Height:        Intake/Output Summary (Last 24 hours)  at 04/12/16 1041 Last data filed at 04/11/16 1700  Gross per 24 hour  Intake              680 ml  Output                0 ml  Net              680 ml   Filed Weights   04/10/16 2053 04/11/16 0426 04/12/16 0658  Weight: 72.4 kg (159 lb 9.8 oz) 73.5 kg (162 lb 0.6 oz) 73.8 kg (162 lb 11.2 oz)    Examination:  General exam: Appears calm and comfortable  Respiratory system: Bilateral crackles. Respiratory effort normal. Cardiovascular system: S1 & S2 heard, RRR. No JVD, murmurs, rubs, gallops or clicks. No pedal edema. Gastrointestinal system: Abdomen is nondistended, soft and nontender. No organomegaly or masses felt. Normal bowel sounds heard. Central nervous system: Alert and oriented. No focal neurological deficits. Extremities: Symmetric 5 x 5 power. Skin: No rashes, lesions or ulcers Psychiatry: Judgement and insight appear normal. Mood & affect appropriate.     Data Reviewed: I have personally reviewed following labs and imaging studies  CBC:  Recent Labs Lab 04/10/16 1658 04/11/16 0440 04/12/16 0605  WBC 4.8 4.7 4.7  HGB 13.3 12.7 12.3  HCT 41.6 39.9 38.4  MCV 83.0 83.3 83.3  PLT 221 209 008   Basic Metabolic Panel:  Recent Labs Lab 04/10/16 1658 04/11/16 0440 04/12/16 0605  NA 136 138 139  K 3.6 3.5 3.6  CL 100* 101 102  CO2 25 29 27   GLUCOSE 95 88 102*  BUN 26* 23* 21*  CREATININE 1.75* 1.65* 1.57*  CALCIUM 9.7 9.4 9.2   GFR: Estimated Creatinine Clearance: 30.4 mL/min (by C-G formula based on SCr of 1.57 mg/dL (H)). Liver Function Tests: No results for input(s): AST, ALT, ALKPHOS, BILITOT, PROT, ALBUMIN in the last 168 hours. No results for input(s): LIPASE, AMYLASE in the last 168 hours. No results for input(s): AMMONIA in the last 168 hours. Coagulation Profile:  Recent Labs Lab 04/10/16 1658 04/11/16 1129 04/12/16 0730  INR 1.79 1.82 1.68   Cardiac Enzymes: No results for input(s): CKTOTAL, CKMB, CKMBINDEX, TROPONINI in the last 168  hours. BNP (last 3 results) No results for input(s): PROBNP in the last 8760 hours. HbA1C: No results for input(s): HGBA1C in the last 72 hours. CBG: No results for input(s): GLUCAP in the last 168 hours. Lipid Profile: No results for input(s): CHOL, HDL, LDLCALC, TRIG, CHOLHDL, LDLDIRECT in the last 72 hours. Thyroid Function Tests: No results for input(s): TSH, T4TOTAL, FREET4, T3FREE, THYROIDAB in the last 72 hours. Anemia Panel: No results for input(s): VITAMINB12, FOLATE, FERRITIN, TIBC, IRON, RETICCTPCT in the last 72 hours. Sepsis Labs: No results for input(s): PROCALCITON, LATICACIDVEN in the last 168 hours.  Recent Results (from the past 240 hour(s))  Surgical pcr screen     Status: None   Collection  Time: 04/11/16 11:04 PM  Result Value Ref Range Status   MRSA, PCR NEGATIVE NEGATIVE Final   Staphylococcus aureus NEGATIVE NEGATIVE Final    Comment:        The Xpert SA Assay (FDA approved for NASAL specimens in patients over 2 years of age), is one component of a comprehensive surveillance program.  Test performance has been validated by [facility] for patients greater than or equal to 8 year old. It is not intended to diagnose infection nor to guide or monitor treatment.          Radiology Studies: Dg Abdomen 1 View  Result Date: 04/10/2016 CLINICAL DATA:  Back and right-sided abdominal pain.  Constipation. EXAM: ABDOMEN - 1 VIEW COMPARISON:  CT 03/22/2012 FINDINGS: Nonobstructive bowel gas pattern. Moderate amount of stool throughout the lower abdomen and pelvis. Again noted is a calcified uterine fibroid. Limited evaluation for free air. Again noted is a compression fracture at L4. IMPRESSION: Normal bowel gas pattern.  Moderate stool burden. Electronically Signed   By: Markus Daft M.D.   On: 04/10/2016 18:42        Scheduled Meds: . docusate sodium  100 mg Oral BID  . feeding supplement (ENSURE ENLIVE)  237 mL Oral BID BM  . furosemide  40 mg Oral  Daily  . liver oil-zinc oxide  1 application Topical TID  . metoprolol succinate  25 mg Oral Daily  . pantoprazole  40 mg Oral Daily  . polyethylene glycol  17 g Oral Daily  . sodium chloride flush  3 mL Intravenous Q12H  . [START ON 04/15/2016] vancomycin  1,000 mg Intravenous to XRAY   Continuous Infusions: . heparin 1,100 Units/hr (04/11/16 2056)     LOS: 2 days    Time spent: 35 minutes.     Elmarie Shiley, MD [facility] Pager [phone]  If 7PM-7AM, please contact night-coverage [email or web address] Password [email or web address] 04/12/2016, 10:41 AM

## 2016-04-12 NOTE — Progress Notes (Signed)
Patient's INR is 1.68 today and it needs to be 1.5 or below due to multiple levels being repaired.  We are still awaiting insurance approval, but the earliest we can proceed if insurance approves would be Monday.  We will prepare her for Monday as of right now, unless something changes.  D/w Dr. Tyrell Antonio.  [name] E 10:25 AM 04/12/2016

## 2016-04-13 ENCOUNTER — Inpatient Hospital Stay (HOSPITAL_COMMUNITY): Payer: Medicare Other

## 2016-04-13 LAB — COMPREHENSIVE METABOLIC PANEL
ALT: 20 U/L (ref 14–54)
AST: 16 U/L (ref 15–41)
Albumin: 3.2 g/dL — ABNORMAL LOW (ref 3.5–5.0)
Alkaline Phosphatase: 187 U/L — ABNORMAL HIGH (ref 38–126)
Anion gap: 8 (ref 5–15)
BUN: 24 mg/dL — ABNORMAL HIGH (ref 6–20)
CO2: 28 mmol/L (ref 22–32)
Calcium: 9.3 mg/dL (ref 8.9–10.3)
Chloride: 102 mmol/L (ref 101–111)
Creatinine, Ser: 1.45 mg/dL — ABNORMAL HIGH (ref 0.44–1.00)
GFR calc Af Amer: 39 mL/min — ABNORMAL LOW (ref >60)
GFR calc non Af Amer: 34 mL/min — ABNORMAL LOW (ref >60)
Glucose, Bld: 83 mg/dL (ref 65–99)
Potassium: 3.6 mmol/L (ref 3.5–5.1)
Sodium: 138 mmol/L (ref 135–145)
Total Bilirubin: 1 mg/dL (ref 0.3–1.2)
Total Protein: 5.8 g/dL — ABNORMAL LOW (ref 6.5–8.1)

## 2016-04-13 LAB — TSH: TSH: 2.239 u[IU]/mL (ref 0.350–4.500)

## 2016-04-13 LAB — CBC
HEMATOCRIT: 38 % (ref 36.0–46.0)
HEMOGLOBIN: 12.1 g/dL (ref 12.0–15.0)
MCH: 26.7 pg (ref 26.0–34.0)
MCHC: 31.8 g/dL (ref 30.0–36.0)
MCV: 83.7 fL (ref 78.0–100.0)
Platelets: 208 10*3/uL (ref 150–400)
RBC: 4.54 MIL/uL (ref 3.87–5.11)
RDW: 21.5 % — ABNORMAL HIGH (ref 11.5–15.5)
WBC: 4.5 10*3/uL (ref 4.0–10.5)

## 2016-04-13 LAB — HEPARIN LEVEL (UNFRACTIONATED): Heparin Unfractionated: 0.57 IU/mL (ref 0.30–0.70)

## 2016-04-13 LAB — PROTIME-INR
INR: 1.42
Prothrombin Time: 17.4 seconds — ABNORMAL HIGH (ref 11.4–15.2)

## 2016-04-13 LAB — GAMMA GT: GGT: 23 U/L (ref 7–50)

## 2016-04-13 LAB — BRAIN NATRIURETIC PEPTIDE: B Natriuretic Peptide: 283.3 pg/mL — ABNORMAL HIGH (ref 0.0–100.0)

## 2016-04-13 MED ORDER — POLYETHYLENE GLYCOL 3350 17 G PO PACK
17.0000 g | PACK | Freq: Two times a day (BID) | ORAL | Status: DC
  Administered 2016-04-14 – 2016-04-19 (×3): 17 g via ORAL
  Filled 2016-04-13 (×8): qty 1

## 2016-04-13 MED ORDER — SENNOSIDES-DOCUSATE SODIUM 8.6-50 MG PO TABS
1.0000 | ORAL_TABLET | Freq: Two times a day (BID) | ORAL | Status: DC
  Administered 2016-04-14 – 2016-04-19 (×3): 1 via ORAL
  Filled 2016-04-13 (×8): qty 1

## 2016-04-13 MED ORDER — MINERAL OIL RE ENEM
1.0000 | ENEMA | Freq: Every day | RECTAL | Status: DC | PRN
  Filled 2016-04-13: qty 1

## 2016-04-13 MED ORDER — BISACODYL 10 MG RE SUPP: 10.0000 mg | Freq: Every day | RECTAL | Status: DC | PRN

## 2016-04-13 MED ORDER — BISACODYL 10 MG RE SUPP
10.0000 mg | Freq: Once | RECTAL | Status: AC
  Administered 2016-04-13: 10 mg via RECTAL
  Filled 2016-04-13: qty 1

## 2016-04-13 NOTE — Progress Notes (Signed)
ANTICOAGULATION CONSULT NOTE - Follow Up Consult  Pharmacy Consult:  Heparin Indication:  Afib / mechanical MVR  Allergies  Allergen Reactions  . Ace Inhibitors     Other reaction(s): Cough  . Hydrocodone Itching and Nausea Only  . Mercury     Other reaction(s): Unknown  . Nickel   . Silver Dermatitis    Severe itching  . Cephalexin Rash  . Penicillins Rash    Has patient had a PCN reaction causing immediate rash, facial/tongue/throat swelling, SOB or lightheadedness with hypotension: YES Has patient had a PCN reaction causing severe rash involving mucus membranes or skin necrosis: NO Has patient had a PCN reaction that required hospitalization NO Has patient had a PCN reaction occurring within the last 10 years: NO If all of the above answers are "NO", then may proceed with Cephalosporin use.    Patient Measurements: Height: 5\' 6"  (167.6 cm) Weight: 162 lb 11.2 oz (73.8 kg) IBW/kg (Calculated) : 59.3 Heparin Dosing Weight: 73 kg  Vital Signs: Temp: 97.9 F (36.6 C) (10/14 0505) Temp Source: Oral (10/14 0505) BP: 136/53 (10/14 0505) Pulse Rate: 61 (10/14 0505)  Labs:  Recent Labs  04/11/16 0440 04/11/16 1129 04/12/16 0605 04/12/16 0730 04/13/16 0429  HGB 12.7  --  12.3  --  12.1  HCT 39.9  --  38.4  --  38.0  PLT 209  --  200  --  208  LABPROT  --  21.3*  --  19.9* 17.4*  INR  --  1.82  --  1.68 1.42  HEPARINUNFRC 0.37 0.41 0.52  --  0.57  CREATININE 1.65*  --  1.57*  --  1.45*    Estimated Creatinine Clearance: 32.9 mL/min (by C-G formula based on SCr of 1.45 mg/dL (H)).  Assessment: 15 YOF with history of Afib and mechanical MVR to continue on IV heparin while Coumadin is on hold for vertebroplasty/kyphoplasty when INR <= 1.5, possibly Mon.  Heparin level is therapeutic at 0.57; no bleeding reported, CBC stable. INR is subtherapeutic at 1.42.   Goal of Therapy:  Heparin level 0.3-0.7 units/ml Monitor platelets by anticoagulation protocol: Yes   Plan:   - Continue heparin gtt at 1100 units/hr - Daily heparin level and CBC - Monitor for s/sx of bleeding - Vertebroplasty/kyphoplasty planned for Mon - Cardiology recommended heparin bridge post-op   Renold Genta, PharmD, BCPS Clinical Pharmacist Phone for today - [place] - [phone] 04/13/2016 8:46 AM

## 2016-04-13 NOTE — Progress Notes (Signed)
PROGRESS NOTE    [name]  BPZ:[id number] DOB: 1934-11-17 DOA: 04/10/2016 PCP: Rusty Aus, MD    Brief Narrative: [name] is a 81 y.o. female with medical history significant for mitral valve replacement on Coumadin, chronic diastolic CHF, GERD, and hypertension who presents to the emergency department for evaluation of severe pain in the mid and low back. Patient reports suffering a mechanical fall at home approximately 3 weeks ago, and another fall approximately one week ago. She has had severe pain in the mid and lower back since that time, but denies numbness or weakness in the lower extremities, fevers or chills, incontinence, or saddle anesthesia. There was no loss of consciousness or head strike with these falls and she had been evaluated with CT, and later MRI of the lumbar and thoracic spine. Imaging revealed compression fractures of L3, L4, L5, T4, T6, and possibly T7. Other chronic vertebral compression fractures were also noted. Patient has been suffering with severe pain secondary to these injuries despite treatment at home with oxycodone. She has been evaluated by interventional radiology and there was plan for possible kyphoplasty. She presents today due to severe uncontrolled pain despite aggressive treatment with oxycodone at home.    Assessment & Plan:   Principal Problem:   Vertebral compression fracture (HCC) Active Problems:   Chronic combined systolic and diastolic CHF    Long term current use of anticoagulant therapy   H/O mitral valve replacement with mechanical valve   Lymphedema   CKD (chronic kidney disease), stage III   Intractable pain   Spinal compression fracture (HCC)   Malnutrition of moderate degree   Pressure injury of skin   1. Multiple compression fractures with intractable pain  - Pt has suffered 2 mechanical falls at home in the last mo and has been essentially bed-bound with severe pain despite treatment with oxycodone at  home - She has been evaluated by IR outpatient. for possible procedure and will need to have warfarin held for this  - IR is following and much appreciated; they are planning for inpt procedure  -INR  1.4. Plan to continue with heparin and possible procedure on Monday.   2. Hx of mitral valve replacement  - Stable, managed with warfarin  - INR subtherapeutic at 1.79 on presentation  - Holding  warfarin for possible procedure.  -Continue with heparin.  -cardio consulted.   3. CKD stage III  - SCr 1.75 on admission, up from apparent baseline of ~1.4--1.6 - resume lasix increase to home dose today. . Holding  spironolactone.  -follow renal function daily. Stable.   4. Chronic systolic CHF  - TTE (08/04/21) with EF 40-45%, moderate TR - Managed with Lasix, Aldactone, Toprol at home - Follow daily wts and I/O's; continue Toprol   -Continue with 40 mg BID.  -holding  spironolactone due to increase in cr.  -cardio consult to help with medical management.  Family would like to change her outpatient cardiologist.   5-Right side chest pain, pain reproduce on palpation of her rib right side.   LFT normal, mild elevation of Alkaline phosphatase, GGT normal. Elevation of ALk phosphatase likely from bone.  Cholelithiasis, likely asymptomatic. Further out patient follow up/ .  Dedicated rib x ray  With questionable 9 th rib fracture. Incentive spirometry. Pain management  Constipation; miralax, senna, refuse fleet enema yesterday,. Will try dulcolax suppository.    6-Elevated Alkaline phosphatase.  GTT normal , Making elevation of Alk phosphatase less likely related to  liver or bile duct  [place] cholelithiasis.   Blister, bloody LE.  Wound care consulted.   DVT prophylaxis: heparin  Code Status: full code.  Family Communication: daughter at bedside.  Disposition Plan: remain inpatient.    Consultants:   IR  Cardiology    Procedures:   none   Antimicrobials:  none   Subjective: No BM yet. Denies abdominal pain  No dyspnea.  Still  with ribs pain   Objective: Vitals:   04/12/16 1331 04/12/16 2004 04/13/16 0505 04/13/16 1300  BP: (!) 124/55 (!) 159/90 (!) 136/53 108/62  Pulse: (!) 52 60 61 (!) 55  Resp: 16 16 16    Temp: 97.6 F (36.4 C) 97.8 F (36.6 C) 97.9 F (36.6 C) 98 F (36.7 C)  TempSrc: Oral Oral Oral Oral  SpO2: 95% 93% 95% 98%  Weight:      Height:        Intake/Output Summary (Last 24 hours) at 04/13/16 1558 Last data filed at 04/13/16 0700  Gross per 24 hour  Intake           529.83 ml  Output                0 ml  Net           529.83 ml   Filed Weights   04/10/16 2053 04/11/16 0426 04/12/16 0658  Weight: 72.4 kg (159 lb 9.8 oz) 73.5 kg (162 lb 0.6 oz) 73.8 kg (162 lb 11.2 oz)    Examination:  General exam: Appears calm and comfortable  Respiratory system: Bilateral crackles. Respiratory effort normal. Cardiovascular system: S1 & S2 heard, RRR. No JVD, murmurs, rubs, gallops or clicks. No pedal edema. Gastrointestinal system: Abdomen is nondistended, soft and nontender. No organomegaly or masses felt. Normal bowel sounds heard. Central nervous system: Alert and oriented. No focal neurological deficits. Extremities: Symmetric 5 x 5 power. Skin: blister left leg, bloody.  Psychiatry: Judgement and insight appear normal. Mood & affect appropriate.     Data Reviewed: I have personally reviewed following labs and imaging studies  CBC:  Recent Labs Lab 04/10/16 1658 04/11/16 0440 04/12/16 0605 04/13/16 0429  WBC 4.8 4.7 4.7 4.5  HGB 13.3 12.7 12.3 12.1  HCT 41.6 39.9 38.4 38.0  MCV 83.0 83.3 83.3 83.7  PLT 221 209 200 094   Basic Metabolic Panel:  Recent Labs Lab 04/10/16 1658 04/11/16 0440 04/12/16 0605 04/13/16 0429  NA 136 138 139 138  K 3.6 3.5 3.6 3.6  CL 100* 101 102 102  CO2 25 29 27 28   GLUCOSE 95 88 102* 83  BUN 26* 23* 21* 24*  CREATININE 1.75* 1.65* 1.57* 1.45*  CALCIUM 9.7  9.4 9.2 9.3   GFR: Estimated Creatinine Clearance: 32.9 mL/min (by C-G formula based on SCr of 1.45 mg/dL (H)). Liver Function Tests:  Recent Labs Lab 04/12/16 1130 04/13/16 0429  AST 18 16  ALT 18 20  ALKPHOS 171* 187*  BILITOT 0.9 1.0  PROT 5.6* 5.8*  ALBUMIN 3.1* 3.2*   No results for input(s): LIPASE, AMYLASE in the last 168 hours. No results for input(s): AMMONIA in the last 168 hours. Coagulation Profile:  Recent Labs Lab 04/10/16 1658 04/11/16 1129 04/12/16 0730 04/13/16 0429  INR 1.79 1.82 1.68 1.42   Cardiac Enzymes: No results for input(s): CKTOTAL, CKMB, CKMBINDEX, TROPONINI in the last 168 hours. BNP (last 3 results) No results for input(s): PROBNP in the last 8760 hours. HbA1C: No results for input(s): HGBA1C in  the last 72 hours. CBG: No results for input(s): GLUCAP in the last 168 hours. Lipid Profile: No results for input(s): CHOL, HDL, LDLCALC, TRIG, CHOLHDL, LDLDIRECT in the last 72 hours. Thyroid Function Tests:  Recent Labs  04/13/16 1044  TSH 2.239   Anemia Panel: No results for input(s): VITAMINB12, FOLATE, FERRITIN, TIBC, IRON, RETICCTPCT in the last 72 hours. Sepsis Labs: No results for input(s): PROCALCITON, LATICACIDVEN in the last 168 hours.  Recent Results (from the past 240 hour(s))  Surgical pcr screen     Status: None   Collection Time: 04/11/16 11:04 PM  Result Value Ref Range Status   MRSA, PCR NEGATIVE NEGATIVE Final   Staphylococcus aureus NEGATIVE NEGATIVE Final    Comment:        The Xpert SA Assay (FDA approved for NASAL specimens in patients over 52 years of age), is one component of a comprehensive surveillance program.  Test performance has been validated by [facility] for patients greater than or equal to 54 year old. It is not intended to diagnose infection nor to guide or monitor treatment.          Radiology Studies: Dg Ribs Unilateral Right  Result Date: 04/12/2016 CLINICAL DATA:   81 year old female status post fall several weeks ago. Right side rib pain. T4, T6, and T7 vertebral body fractures noted by MRI in September. Subsequent encounter. EXAM: RIGHT RIBS - 2 VIEW COMPARISON:  Chest radiographs 10/10/2015 and earlier. Thoracic spine MRI 03/21/2016 FINDINGS: Right rib marker placed at the right lateral ninth or tenth rib level near the costochondral junctions. No displaced right rib fracture identified. Questionable nondisplaced right posterior lateral ninth rib fracture (arrow) Osteopenia. No right pneumothorax or pleural effusion. Subsegmental right lung base atelectasis. Stable visible cardiomegaly and mediastinal contours status post prior sternotomy and cardiac valve replacement. IMPRESSION: 1. Osteopenia. Questionable nondisplaced right posterior lateral ninth rib fracture. No displaced right rib fracture identified. 2. Subsegmental atelectasis at the right lung base. Electronically Signed   By: Genevie Ann M.D.   On: 04/12/2016 16:07   US Abdomen Limited Ruq  Result Date: 04/13/2016 CLINICAL DATA:  Abdominal pain for 1 week. EXAM: US ABDOMEN LIMITED - RIGHT UPPER QUADRANT COMPARISON:  None. FINDINGS: Gallbladder: Layering sludge and multiple stones identified in the lumen. Gallbladder wall thickness upper normal at 3 mm. No pericholecystic fluid in the sonographer reports no sonographic Murphy sign. Common bile duct: Diameter: 5 mm Liver: 7 mm hypoechoic lesion inferior liver, likely a cyst. IMPRESSION: Cholelithiasis. Electronically Signed   By: Misty Stanley M.D.   On: 04/13/2016 08:50        Scheduled Meds: . docusate sodium  100 mg Oral BID  . feeding supplement (ENSURE ENLIVE)  237 mL Oral BID BM  . furosemide  40 mg Oral BID  . liver oil-zinc oxide  1 application Topical TID  . metoprolol succinate  25 mg Oral Daily  . pantoprazole  40 mg Oral Daily  . polyethylene glycol  17 g Oral Daily  . sodium chloride flush  3 mL Intravenous Q12H  . [START ON 04/15/2016]  vancomycin  1,000 mg Intravenous to XRAY   Continuous Infusions: . heparin 1,100 Units/hr (04/13/16 1340)     LOS: 3 days    Time spent: 35 minutes.     Elmarie Shiley, MD [facility] Pager [phone]  If 7PM-7AM, please contact night-coverage [email or web address] Password [email or web address] 04/13/2016, 3:58 PM

## 2016-04-13 NOTE — Progress Notes (Signed)
Physical Therapy Evaluation Patient Details Name: [name] MRN: [id number] DOB: January 11, 1935 Today's Date: 04/13/2016   History of Present Illness  81 yo female with history of Mitral valve replacement and CHF on coumadin, admitted with severe pain, post multiple falls over past 3 weeks at home.  Imaging shows spine compression fractures T4, T6, T7, L3, L4, L5.  Pending kyphoplasty when INR levels appropriate.  Clinical Impression  Patient seen with son and daughter present, initially reports pain severe and unable to work with therapy, but able to get to bathroom and bedside chair eventually.  Bed Mobility MAX assist due to pain, Transfers and gait with MIN/MOD assist in room, with O2.  Patient has pending kyphoplasty when INR in range, may make difference in DC setting, but patient is high fall risk and is weak in addition to pain, and will remain on PT services.    Follow Up Recommendations SNF (DC recommendation may change following kyphoplasty procedure)    Equipment Recommendations  None recommended by PT    Recommendations for Other Services OT consult     Precautions / Restrictions Precautions Precautions: Fall Restrictions Weight Bearing Restrictions: No      Mobility  Bed Mobility Overal bed mobility: Needs Assistance Bed Mobility: Supine to Sit     Supine to sit: Mod assist;Max assist;+2 for physical assistance     General bed mobility comments: Son present, assisted with rotating chuck to sit edge of bed.  Transfers Overall transfer level: Needs assistance Equipment used: Rolling walker (2 wheeled) Transfers: Sit to/from [facility] Sit to Stand: Min assist;Mod assist Stand pivot transfers: Min assist       General transfer comment: Cues for hand placement, pain and unsteady  Ambulation/Gait Ambulation/Gait assistance: Min assist Ambulation Distance (Feet): 15 Feet Assistive device: Rolling walker (2 wheeled) Gait  Pattern/deviations: Decreased stride length;Antalgic;Trunk flexed     General Gait Details: into bathroom and back to bedside chair.  Stairs            Wheelchair Mobility    Modified Rankin (Stroke Patients Only)       Balance Overall balance assessment: History of Falls;Needs assistance   Sitting balance-Leahy Scale: Fair       Standing balance-Leahy Scale: Poor                               Pertinent Vitals/Pain Pain Assessment: 0-10 Pain Score: 7  Pain Location: Back Pain Descriptors / Indicators: Aching;Sore Pain Intervention(s): Limited activity within patient's tolerance;Monitored during session;Repositioned    Home Living Family/patient expects to be discharged to:: Private residence Living Arrangements: Alone Available Help at Discharge: Family;Available PRN/intermittently Type of Home: House Home Access: Stairs to enter Entrance Stairs-Rails: None Entrance Stairs-Number of Steps: 2 Home Layout: Two level Home Equipment: Walker - 2 wheels Additional Comments: 1/2 bathroom only on main level, sleeps in chair.    Prior Function Level of Independence: Independent with assistive device(s)         Comments: Patient reports independent, children report difficulty with mobilty and rapid decline.     Hand Dominance        Extremity/Trunk Assessment   Upper Extremity Assessment: Overall WFL for tasks assessed           Lower Extremity Assessment: Generalized weakness      Cervical / Trunk Assessment: Kyphotic  Communication   Communication: No difficulties  Cognition Arousal/Alertness: Awake/alert Behavior During Therapy: [facility]  for tasks assessed/performed Overall Cognitive Status: Within Functional Limits for tasks assessed                      General Comments General comments (skin integrity, edema, etc.): Back and legs with multiple areas of discoloration/bruising    Exercises     Assessment/Plan    PT  Assessment Patient needs continued PT services  PT Problem List Decreased strength;Decreased activity tolerance;Decreased balance;Decreased mobility;Decreased knowledge of precautions;Pain          PT Treatment Interventions Gait training;Functional mobility training;Stair training;Therapeutic activities;Therapeutic exercise;Balance training;Patient/family education    PT Goals (Current goals can be found in the Care Plan section)  Acute Rehab PT Goals Patient Stated Goal: To go home PT Goal Formulation: With patient Time For Goal Achievement: 04/27/16 Potential to Achieve Goals: Good    Frequency Min 3X/week   Barriers to discharge        Co-evaluation               End of Session Equipment Utilized During Treatment: Gait belt;Oxygen Activity Tolerance: No increased pain;Patient limited by fatigue Patient left: in chair;with call bell/phone within reach;with family/visitor present Nurse Communication: Mobility status         Time: 1540-1630 PT Time Calculation (min) (ACUTE ONLY): 50 min   Charges:   PT Evaluation $PT Eval Moderate Complexity: 1 Procedure PT Treatments $Therapeutic Activity: 8-22 mins   PT G Codes:        Chosen Geske L 2016/04/15, 5:02 PM

## 2016-04-14 LAB — BASIC METABOLIC PANEL
Anion gap: 11 (ref 5–15)
BUN: 20 mg/dL (ref 6–20)
CHLORIDE: 98 mmol/L — AB (ref 101–111)
CO2: 27 mmol/L (ref 22–32)
CREATININE: 1.49 mg/dL — AB (ref 0.44–1.00)
Calcium: 9.7 mg/dL (ref 8.9–10.3)
GFR calc non Af Amer: 32 mL/min — ABNORMAL LOW (ref >60)
GFR, EST AFRICAN AMERICAN: 38 mL/min — AB (ref >60)
GLUCOSE: 95 mg/dL (ref 65–99)
Potassium: 3.7 mmol/L (ref 3.5–5.1)
Sodium: 136 mmol/L (ref 135–145)

## 2016-04-14 LAB — PROTIME-INR
INR: 1.38
Prothrombin Time: 17.1 seconds — ABNORMAL HIGH (ref 11.4–15.2)

## 2016-04-14 LAB — CBC
HCT: 37.3 % (ref 36.0–46.0)
Hemoglobin: 11.5 g/dL — ABNORMAL LOW (ref 12.0–15.0)
MCH: 25.8 pg — AB (ref 26.0–34.0)
MCHC: 30.8 g/dL (ref 30.0–36.0)
MCV: 83.8 fL (ref 78.0–100.0)
PLATELETS: 203 10*3/uL (ref 150–400)
RBC: 4.45 MIL/uL (ref 3.87–5.11)
RDW: 21.1 % — ABNORMAL HIGH (ref 11.5–15.5)
WBC: 4.6 10*3/uL (ref 4.0–10.5)

## 2016-04-14 LAB — HEPARIN LEVEL (UNFRACTIONATED): Heparin Unfractionated: 0.46 IU/mL (ref 0.30–0.70)

## 2016-04-14 MED ORDER — POTASSIUM CHLORIDE CRYS ER 20 MEQ PO TBCR
40.0000 meq | EXTENDED_RELEASE_TABLET | Freq: Once | ORAL | Status: AC
  Administered 2016-04-14: 40 meq via ORAL
  Filled 2016-04-14: qty 2

## 2016-04-14 MED ORDER — WHITE PETROLATUM GEL
Status: AC
  Administered 2016-04-14: 06:00:00
  Filled 2016-04-14: qty 1

## 2016-04-14 MED ORDER — METOPROLOL SUCCINATE ER 25 MG PO TB24
25.0000 mg | ORAL_TABLET | Freq: Every day | ORAL | Status: DC
  Administered 2016-04-15 – 2016-04-19 (×5): 25 mg via ORAL
  Filled 2016-04-14 (×5): qty 1

## 2016-04-14 NOTE — Progress Notes (Signed)
ANTICOAGULATION CONSULT NOTE - Follow Up Consult  Pharmacy Consult:  Heparin Indication:  Afib / mechanical MVR  Allergies  Allergen Reactions  . Ace Inhibitors     Other reaction(s): Cough  . Hydrocodone Itching and Nausea Only  . Mercury     Other reaction(s): Unknown  . Nickel   . Silver Dermatitis    Severe itching  . Cephalexin Rash  . Penicillins Rash    Has patient had a PCN reaction causing immediate rash, facial/tongue/throat swelling, SOB or lightheadedness with hypotension: YES Has patient had a PCN reaction causing severe rash involving mucus membranes or skin necrosis: NO Has patient had a PCN reaction that required hospitalization NO Has patient had a PCN reaction occurring within the last 10 years: NO If all of the above answers are "NO", then may proceed with Cephalosporin use.    Patient Measurements: Height: 5\' 6"  (167.6 cm) Weight: 153 lb 10.6 oz (69.7 kg) IBW/kg (Calculated) : 59.3 Heparin Dosing Weight: 73 kg  Vital Signs: Temp: 97.5 F (36.4 C) (10/15 0409) Temp Source: Oral (10/15 0409) BP: 127/68 (10/15 0409) Pulse Rate: 56 (10/15 0409)  Labs:  Recent Labs  04/12/16 0605 04/12/16 0730 04/13/16 0429 04/14/16 0254  HGB 12.3  --  12.1 11.5*  HCT 38.4  --  38.0 37.3  PLT 200  --  208 203  LABPROT  --  19.9* 17.4* 17.1*  INR  --  1.68 1.42 1.38  HEPARINUNFRC 0.52  --  0.57 0.46  CREATININE 1.57*  --  1.45*  --     Estimated Creatinine Clearance: 29.9 mL/min (by C-G formula based on SCr of 1.45 mg/dL (H)).  Assessment: 5 YOF with history of Afib and mechanical MVR to continue on IV heparin while Coumadin is on hold for vertebroplasty/kyphoplasty planned for tomorrow.  Heparin level is therapeutic at 0.46; no bleeding reported, CBC stable. INR is <= 1.5 now.   Goal of Therapy:  Heparin level 0.3-0.7 units/ml Monitor platelets by anticoagulation protocol: Yes   Plan:  - Continue heparin gtt at 1100 units/hr - Daily heparin level and  CBC - Monitor for s/sx of bleeding - Vertebroplasty/kyphoplasty planned for tomorrow - Cardiology recommended heparin bridge post-op   Renold Genta, PharmD, BCPS Clinical Pharmacist Phone for today - [place] - [phone] 04/14/2016 10:37 AM

## 2016-04-14 NOTE — Progress Notes (Signed)
Parameters added for metoprolol, hospitalist made aware.

## 2016-04-14 NOTE — Progress Notes (Signed)
PROGRESS NOTE    [name]  AYT:[id number] DOB: 1934-12-18 DOA: 04/10/2016 PCP: Rusty Aus, MD    Brief Narrative: [name] is a 81 y.o. female with medical history significant for mitral valve replacement on Coumadin, chronic diastolic CHF, GERD, and hypertension who presents to the emergency department for evaluation of severe pain in the mid and low back. Patient reports suffering a mechanical fall at home approximately 3 weeks ago, and another fall approximately one week ago. She has had severe pain in the mid and lower back since that time, but denies numbness or weakness in the lower extremities, fevers or chills, incontinence, or saddle anesthesia. There was no loss of consciousness or head strike with these falls and she had been evaluated with CT, and later MRI of the lumbar and thoracic spine. Imaging revealed compression fractures of L3, L4, L5, T4, T6, and possibly T7. Other chronic vertebral compression fractures were also noted. Patient has been suffering with severe pain secondary to these injuries despite treatment at home with oxycodone. She has been evaluated by interventional radiology and there was plan for possible kyphoplasty. She presents today due to severe uncontrolled pain despite aggressive treatment with oxycodone at home.    Assessment & Plan:   Principal Problem:   Vertebral compression fracture (HCC) Active Problems:   Chronic combined systolic and diastolic CHF    Long term current use of anticoagulant therapy   H/O mitral valve replacement with mechanical valve   Lymphedema   CKD (chronic kidney disease), stage III   Intractable pain   Spinal compression fracture (HCC)   Malnutrition of moderate degree   Pressure injury of skin   1. Multiple compression fractures with intractable pain  - Pt has suffered 2 mechanical falls at home in the last mo and has been essentially bed-bound with severe pain despite treatment with oxycodone at  home - She has been evaluated by IR outpatient. for possible procedure and will need to have warfarin held for this  - IR is following and much appreciated; they are planning for inpt procedure  -INR  1.4. Plan to continue with heparin and possible procedure on Monday.   2. Hx of mitral valve replacement  - Stable, managed with warfarin  - INR subtherapeutic at 1.79 on presentation  - Holding  warfarin for possible procedure.  -Continue with heparin.  -cardio consulted.   3. CKD stage III  - SCr 1.75 on admission, up from apparent baseline of ~1.4--1.6 - resume lasix increase to home dose today. . Holding  spironolactone.  -follow renal function daily. Stable.   4. Chronic systolic CHF  - TTE (09/03/55) with EF 40-45%, moderate TR - Managed with Lasix, Aldactone, Toprol at home - Follow daily wts and I/O's; continue Toprol   -Continue with 40 mg BID.  -holding  spironolactone due to increase in cr.  -cr stable,.  Bradycardia, asymptomatic, check b-met, hold Toprol for now, will ask cardio.   5-Right side chest pain, pain reproduce on palpation of her rib right side.   LFT normal, mild elevation of Alkaline phosphatase, GGT normal. Elevation of ALk phosphatase likely from bone.  Cholelithiasis, likely asymptomatic. Further out patient follow up/ .  Dedicated rib x ray  With questionable 9 th rib fracture. Incentive spirometry. Pain management  Constipation; miralax, senna, dulcolax suppository.  Had BM 10-14   6-Elevated Alkaline phosphatase.  GTT normal , Making elevation of Alk phosphatase less likely related to liver or bile duct  [place] cholelithiasis.   Blister, bloody LE.  Wound care consulted.   DVT prophylaxis: heparin  Code Status: full code.  Family Communication: daughter at bedside.  Disposition Plan: remain inpatient.    Consultants:   IR  Cardiology    Procedures:   none   Antimicrobials: none   Subjective: Sitting in the recliner. She was able  to have BM.  Pain control. Awaiting procedure for tomorrow.   Objective: Vitals:   04/13/16 2002 04/14/16 0409 04/14/16 1046 04/14/16 1049  BP: (!) 111/59 127/68 (!) 124/55   Pulse: (!) 58 (!) 56 (!) 53 (!) 55  Resp: _0 Temp: 97.3 F (36.3 C) 97.5 F (36.4 C)    TempSrc: Oral Oral    SpO2: 100% 97%  98%  Weight:  69.7 kg (153 lb 10.6 oz)    Height:        Intake/Output Summary (Last 24 hours) at 04/14/16 1347 Last data filed at 04/13/16 1800  Gross per 24 hour  Intake              121 ml  Output                0 ml  Net              121 ml   Filed Weights   04/11/16 0426 04/12/16 0658 04/14/16 0409  Weight: 73.5 kg (162 lb 0.6 oz) 73.8 kg (162 lb 11.2 oz) 69.7 kg (153 lb 10.6 oz)    Examination:  General exam: Appears calm and comfortable  Respiratory system: Bilateral crackles. Respiratory effort normal. Cardiovascular system: S1 & S2 heard, RRR. No JVD, murmurs, rubs, gallops or clicks. No pedal edema. Gastrointestinal system: Abdomen is nondistended, soft and nontender. No organomegaly or masses felt. Normal bowel sounds heard. Central nervous system: Alert and oriented. No focal neurological deficits. Extremities: Symmetric 5 x 5 power. Skin: blister left leg, bloody.  Psychiatry: Judgement and insight appear normal. Mood & affect appropriate.     Data Reviewed: I have personally reviewed following labs and imaging studies  CBC:  Recent Labs Lab 04/10/16 1658 04/11/16 0440 04/12/16 0605 04/13/16 0429 04/14/16 0254  WBC 4.8 4.7 4.7 4.5 4.6  HGB 13.3 12.7 12.3 12.1 11.5*  HCT 41.6 39.9 38.4 38.0 37.3  MCV 83.0 83.3 83.3 83.7 83.8  PLT 221 209 200 208 607   Basic Metabolic Panel:  Recent Labs Lab 04/10/16 1658 04/11/16 0440 04/12/16 0605 04/13/16 0429 04/14/16 1225  NA 136 138 139 138 136  K 3.6 3.5 3.6 3.6 3.7  CL 100* 101 102 102 98*  CO2 _1 GLUCOSE 95 88 102* 83 95  BUN 26* 23* 21* 24* 20  CREATININE 1.75* 1.65* 1.57*  1.45* 1.49*  CALCIUM 9.7 9.4 9.2 9.3 9.7   GFR: Estimated Creatinine Clearance: 29.1 mL/min (by C-G formula based on SCr of 1.49 mg/dL (H)). Liver Function Tests:  Recent Labs Lab 04/12/16 1130 04/13/16 0429  AST 18 16  ALT 18 20  ALKPHOS 171* 187*  BILITOT 0.9 1.0  PROT 5.6* 5.8*  ALBUMIN 3.1* 3.2*   No results for input(s): LIPASE, AMYLASE in the last 168 hours. No results for input(s): AMMONIA in the last 168 hours. Coagulation Profile:  Recent Labs Lab 04/10/16 1658 04/11/16 1129 04/12/16 0730 04/13/16 0429 04/14/16 0254  INR 1.79 1.82 1.68 1.42 1.38   Cardiac Enzymes: No results for input(s): CKTOTAL, CKMB, CKMBINDEX, TROPONINI in the last  168 hours. BNP (last 3 results) No results for input(s): PROBNP in the last 8760 hours. HbA1C: No results for input(s): HGBA1C in the last 72 hours. CBG: No results for input(s): GLUCAP in the last 168 hours. Lipid Profile: No results for input(s): CHOL, HDL, LDLCALC, TRIG, CHOLHDL, LDLDIRECT in the last 72 hours. Thyroid Function Tests:  Recent Labs  04/13/16 1044  TSH 2.239   Anemia Panel: No results for input(s): VITAMINB12, FOLATE, FERRITIN, TIBC, IRON, RETICCTPCT in the last 72 hours. Sepsis Labs: No results for input(s): PROCALCITON, LATICACIDVEN in the last 168 hours.  Recent Results (from the past 240 hour(s))  Surgical pcr screen     Status: None   Collection Time: 04/11/16 11:04 PM  Result Value Ref Range Status   MRSA, PCR NEGATIVE NEGATIVE Final   Staphylococcus aureus NEGATIVE NEGATIVE Final    Comment:        The Xpert SA Assay (FDA approved for NASAL specimens in patients over 21 years of age), is one component of a comprehensive surveillance program.  Test performance has been validated by [facility] for patients greater than or equal to 1 year old. It is not intended to diagnose infection nor to guide or monitor treatment.          Radiology Studies: Dg Ribs Unilateral  Right  Result Date: 04/12/2016 CLINICAL DATA:  80-year-old female status post fall several weeks ago. Right side rib pain. T4, T6, and T7 vertebral body fractures noted by MRI in September. Subsequent encounter. EXAM: RIGHT RIBS - 2 VIEW COMPARISON:  Chest radiographs 10/10/2015 and earlier. Thoracic spine MRI 03/21/2016 FINDINGS: Right rib marker placed at the right lateral ninth or tenth rib level near the costochondral junctions. No displaced right rib fracture identified. Questionable nondisplaced right posterior lateral ninth rib fracture (arrow) Osteopenia. No right pneumothorax or pleural effusion. Subsegmental right lung base atelectasis. Stable visible cardiomegaly and mediastinal contours status post prior sternotomy and cardiac valve replacement. IMPRESSION: 1. Osteopenia. Questionable nondisplaced right posterior lateral ninth rib fracture. No displaced right rib fracture identified. 2. Subsegmental atelectasis at the right lung base. Electronically Signed   By: H  Hall M.D.   On: 04/12/2016 16:07   Us Abdomen Limited Ruq  Result Date: 04/13/2016 CLINICAL DATA:  Abdominal pain for 1 week. EXAM: US ABDOMEN LIMITED - RIGHT UPPER QUADRANT COMPARISON:  None. FINDINGS: Gallbladder: Layering sludge and multiple stones identified in the lumen. Gallbladder wall thickness upper normal at 3 mm. No pericholecystic fluid in the sonographer reports no sonographic Murphy sign. Common bile duct: Diameter: 5 mm Liver: 7 mm hypoechoic lesion inferior liver, likely a cyst. IMPRESSION: Cholelithiasis. Electronically Signed   By: Eric  Mansell M.D.   On: 04/13/2016 08:50        Scheduled Meds: . feeding supplement (ENSURE ENLIVE)  237 mL Oral BID BM  . furosemide  40 mg Oral BID  . liver oil-zinc oxide  1 application Topical TID  . metoprolol succinate  25 mg Oral Daily  . pantoprazole  40 mg Oral Daily  . polyethylene glycol  17 g Oral BID  . senna-docusate  1 tablet Oral BID  . sodium chloride flush   3 mL Intravenous Q12H  . [START ON 04/15/2016] vancomycin  1,000 mg Intravenous to XRAY   Continuous Infusions: . heparin 1,100 Units/hr (04/14/16 0829)     LOS: 4 days    Time spent: 35 minutes.     ,  A, MD [facility] Pager [phone]  If 7PM-7AM,   please contact night-coverage [email or web address] Password [email or web address] 04/14/2016, 1:47 PM  

## 2016-04-14 NOTE — NC FL2 (Signed)
[place] LEVEL OF CARE SCREENING TOOL     IDENTIFICATION  Patient Name: [name] Birthdate: Jul 11, 1934 Sex: female Admission Date (Current Location): 04/10/2016  [facility] and [place] Number:  Herbalist and Address:  [facility], [place] [address] [place], [place] [address]      Provider Number: [id number]  Attending Physician Name and Address:  Elmarie Shiley, MD  Relative Name and Phone Number:       Current Level of Care: Hospital Recommended Level of Care: [place] Prior Approval Number:    Date Approved/Denied:   PASRR Number:    Discharge Plan: SNF    Current Diagnoses: Patient Active Problem List   Diagnosis Date Noted  . Pressure injury of skin 04/12/2016  . Malnutrition of moderate degree 04/11/2016  . Vertebral compression fracture ([place]) 04/10/2016  . Intractable pain 04/10/2016  . Spinal compression fracture ([place]) 04/10/2016  . Chronic systolic heart failure ([place]) 11/19/2015  . Systolic and diastolic CHF, acute on chronic ([place])   . Lymphedema   . CKD (chronic kidney disease), stage III   . Bilateral leg edema   . Acute CHF (congestive heart failure) ([place]) 09/28/2015  . Bilateral lower extremity edema 08/30/2015  . ARF (acute renal failure) ([place]) 04/28/2015  . Hyponatremia 04/28/2015  . H/O mitral valve replacement with mechanical valve 11/04/2014  . Long term current use of anticoagulant therapy 09/28/2014  . Chronic combined systolic and diastolic CHF  18/56/3149  . 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 Generalized OA 11/28/2013  . HLD (hyperlipidemia) 11/28/2013    Orientation RESPIRATION BLADDER Height & Weight     Self, Time, Situation, Place  O2 (2l/min) Continent Weight: 153 lb 10.6 oz (69.7 kg) Height:  5\' 6"  (167.6 cm)  BEHAVIORAL SYMPTOMS/MOOD NEUROLOGICAL BOWEL NUTRITION STATUS   (none)  (none) Continent  (NPO)  AMBULATORY STATUS COMMUNICATION OF  NEEDS Skin   Extensive Assist Verbally Surgical wounds                       Personal Care Assistance Level of Assistance  Bathing, Feeding, Dressing Bathing Assistance: Limited assistance Feeding assistance: Independent Dressing Assistance: Limited assistance     Functional Limitations Info  Sight, Hearing, Speech Sight Info: Adequate Hearing Info: Adequate Speech Info: Adequate    SPECIAL CARE FACTORS FREQUENCY  PT (By licensed PT)     PT Frequency:  (5X/WEEK)              Contractures Contractures Info: Not present    Additional Factors Info  Code Status, Allergies Code Status Info:  (FULL) Allergies Info:  (Ace Inhibitors, Hydrocodone, Mercury, Nickel, Silver, Cephalexin, Penicillins)           Current Medications (04/14/2016):  This is the current hospital active medication list Current Facility-Administered Medications  Medication Dose Route Frequency Provider Last Rate Last Dose  . 0.9 %  sodium chloride infusion  250 mL Intravenous PRN Vianne Bulls, MD      . acetaminophen (TYLENOL) tablet 500 mg  500 mg Oral Q6H PRN Vianne Bulls, MD   500 mg at 04/14/16 1040  . bisacodyl (DULCOLAX) suppository 10 mg  10 mg Rectal Daily PRN Belkys A Regalado, MD      . feeding supplement (ENSURE ENLIVE) (ENSURE ENLIVE) liquid 237 mL  237 mL Oral BID BM Ilene Qua Opyd, MD   237 mL at 04/14/16 1040  . furosemide (LASIX)  tablet 40 mg  40 mg Oral BID Belkys A Regalado, MD   40 mg at 04/14/16 0846  . heparin ADULT infusion 100 units/mL (25000 units/26mL sodium chloride 0.45%)  1,100 Units/hr Intravenous Continuous Vianne Bulls, MD 11 mL/hr at 04/14/16 0829 1,100 Units/hr at 04/14/16 0829  . HYDROmorphone (DILAUDID) injection 0.5-1 mg  0.5-1 mg Intravenous Q3H PRN Vianne Bulls, MD   0.5 mg at 04/12/16 1510  . liver oil-zinc oxide (DESITIN) 40 % ointment 1 application  1 application Topical TID Vianne Bulls, MD   1 application at 86/16/83 1041  . metoprolol succinate  (TOPROL-XL) 24 hr tablet 25 mg  25 mg Oral Daily Vianne Bulls, MD   Stopped at 04/14/16 1040  . mineral oil enema 1 enema  1 enema Rectal Daily PRN Belkys A Regalado, MD      . ondansetron (ZOFRAN) tablet 4 mg  4 mg Oral Q6H PRN Vianne Bulls, MD       Or  . ondansetron (ZOFRAN) injection 4 mg  4 mg Intravenous Q6H PRN Vianne Bulls, MD      . oxyCODONE (Oxy IR/ROXICODONE) immediate release tablet 5 mg  5 mg Oral Q4H PRN Vianne Bulls, MD   5 mg at 04/14/16 0846  . pantoprazole (PROTONIX) EC tablet 40 mg  40 mg Oral Daily Vianne Bulls, MD   40 mg at 04/14/16 1040  . polyethylene glycol (MIRALAX / GLYCOLAX) packet 17 g  17 g Oral BID Belkys A Regalado, MD   17 g at 04/14/16 1040  . senna-docusate (Senokot-S) tablet 1 tablet  1 tablet Oral BID Belkys A Regalado, MD   1 tablet at 04/14/16 1040  . sodium chloride flush (NS) 0.9 % injection 3 mL  3 mL Intravenous Q12H Ilene Qua Opyd, MD   3 mL at 04/13/16 2200  . sodium chloride flush (NS) 0.9 % injection 3 mL  3 mL Intravenous PRN Ilene Qua Opyd, MD      . sodium phosphate (FLEET) 7-19 GM/118ML enema 1 enema  1 enema Rectal Once PRN Vianne Bulls, MD      . Derrill Memo ON 04/15/2016] vancomycin (VANCOCIN) IVPB 1000 mg/200 mL premix  1,000 mg Intravenous to XRAY Saverio Danker, PA-C         Discharge Medications: Please see discharge summary for a list of discharge medications.  Relevant Imaging Results:  Relevant Lab Results:   Additional Information SS#: [id number]  Junie Spencer, LCSW

## 2016-04-14 NOTE — Clinical Social Work Note (Signed)
Clinical Social Work Assessment  Patient Details  Name: [name] MRN: [id number] Date of Birth: December 31, 1934  Date of referral:  04/14/16               Reason for consult:  Discharge Planning                Permission sought to share information with:  Case Manager, Facility Sport and exercise psychologist, Family Supports Permission granted to share information::  Yes, Verbal Permission Granted  Name::        Agency::   (SNF)  Relationship::     Contact Information:     Housing/Transportation Living arrangements for the past 2 months:  Single Family Home Source of Information:  Patient, Medical Team Patient Interpreter Needed:  None Criminal Activity/Legal Involvement Pertinent to Current Situation/Hospitalization:  No - Comment as needed Significant Relationships:  Adult Children Lives with:  Self Do you feel safe going back to the place where you live?  No Need for family participation in patient care:  Yes (Comment)  Care giving concerns: Pt son expressed concerns about the pt going home and stated pt is in need of SNF as she lives alone and will be unable to walk up and down the stairs at home. Pt son also requested CSW contact him and his sister regarding SNF bed offers as pt is having a hard time with accepting the need for SNF.    Social Worker assessment / plan: Holiday representative met with pt and family and discussed CSW role with discharge planning. CSW also explained PT recommendation for Short-term rehab at [facility]. Pt family is agreeable to SNF for higher level of care. Pt son said he and his sister need time to research SNF placements for their.   CSW will follow up with bed offers once available.   Employment status:  Retired Forensic scientist:  Medicare PT Recommendations:  [place] / Referral to community resources:  [place]  Patient/Family's Response to care:Pt pleasant and sitting in recliner next to bed with  family in the room. Pt and family appear happy with care pt is receiving at [facility].   Patient/Family's Understanding of and Emotional Response to Diagnosis, Current Treatment, and Prognosis: Pt and family appear to have a good understanding of reason for admission to hospital and with pt care plan.  Emotional Assessment Appearance:  Appears stated age Attitude/Demeanor/Rapport:   (Pleasant) Affect (typically observed):  Pleasant Orientation:  Oriented to Self, Oriented to Place, Oriented to  Time, Oriented to Situation Alcohol / Substance use:  Not Applicable Psych involvement (Current and /or in the community):  No (Comment)  Discharge Needs  Concerns to be addressed:  Discharge Planning Concerns Readmission within the last 30 days:  No Current discharge risk:  Lives alone Barriers to Discharge:  Continued Medical Work up   Junie Spencer, LCSW 04/14/2016, 4:38 PM

## 2016-04-14 NOTE — Progress Notes (Signed)
Cardiology paged earlier today to follow up on metoprolol dose to determine if medication should be given with HR in the 50's, per hospitalist request.  Awaiting callback.  Will continue to follow up.

## 2016-04-15 ENCOUNTER — Telehealth (HOSPITAL_COMMUNITY): Payer: Self-pay | Admitting: Radiology

## 2016-04-15 ENCOUNTER — Inpatient Hospital Stay (HOSPITAL_COMMUNITY): Payer: Medicare Other

## 2016-04-15 HISTORY — PX: IR GENERIC HISTORICAL: IMG1180011

## 2016-04-15 LAB — CBC
HEMATOCRIT: 37.9 % (ref 36.0–46.0)
HEMOGLOBIN: 11.7 g/dL — AB (ref 12.0–15.0)
MCH: 25.9 pg — ABNORMAL LOW (ref 26.0–34.0)
MCHC: 30.9 g/dL (ref 30.0–36.0)
MCV: 83.8 fL (ref 78.0–100.0)
Platelets: 194 10*3/uL (ref 150–400)
RBC: 4.52 MIL/uL (ref 3.87–5.11)
RDW: 21.2 % — ABNORMAL HIGH (ref 11.5–15.5)
WBC: 3.5 10*3/uL — AB (ref 4.0–10.5)

## 2016-04-15 LAB — HEPARIN LEVEL (UNFRACTIONATED): HEPARIN UNFRACTIONATED: 0.53 [IU]/mL (ref 0.30–0.70)

## 2016-04-15 LAB — BASIC METABOLIC PANEL
ANION GAP: 8 (ref 5–15)
BUN: 19 mg/dL (ref 6–20)
CO2: 28 mmol/L (ref 22–32)
Calcium: 9.5 mg/dL (ref 8.9–10.3)
Chloride: 102 mmol/L (ref 101–111)
Creatinine, Ser: 1.4 mg/dL — ABNORMAL HIGH (ref 0.44–1.00)
GFR, EST AFRICAN AMERICAN: 41 mL/min — AB (ref >60)
GFR, EST NON AFRICAN AMERICAN: 35 mL/min — AB (ref >60)
Glucose, Bld: 96 mg/dL (ref 65–99)
POTASSIUM: 4.1 mmol/L (ref 3.5–5.1)
SODIUM: 138 mmol/L (ref 135–145)

## 2016-04-15 LAB — PROTIME-INR
INR: 1.24
Prothrombin Time: 15.6 seconds — ABNORMAL HIGH (ref 11.4–15.2)

## 2016-04-15 MED ORDER — GELATIN ABSORBABLE 12-7 MM EX MISC
CUTANEOUS | Status: AC
  Administered 2016-04-15: 15:00:00 via INTRAMUSCULAR
  Filled 2016-04-15: qty 1

## 2016-04-15 MED ORDER — WARFARIN SODIUM 7.5 MG PO TABS
7.5000 mg | ORAL_TABLET | Freq: Once | ORAL | Status: AC
  Administered 2016-04-15: 7.5 mg via ORAL
  Filled 2016-04-15: qty 1

## 2016-04-15 MED ORDER — TOBRAMYCIN SULFATE 1.2 G IJ SOLR
INTRAMUSCULAR | Status: AC
  Filled 2016-04-15: qty 1.2

## 2016-04-15 MED ORDER — HYDROMORPHONE HCL 1 MG/ML IJ SOLN
INTRAMUSCULAR | Status: AC
  Filled 2016-04-15: qty 2

## 2016-04-15 MED ORDER — HEPARIN (PORCINE) IN NACL 100-0.45 UNIT/ML-% IJ SOLN
1100.0000 [IU]/h | INTRAMUSCULAR | Status: DC
  Administered 2016-04-15 – 2016-04-18 (×3): 1100 [IU]/h via INTRAVENOUS
  Filled 2016-04-15 (×6): qty 250

## 2016-04-15 MED ORDER — MIDAZOLAM HCL 2 MG/2ML IJ SOLN
INTRAMUSCULAR | Status: AC | PRN
  Administered 2016-04-15 (×2): 0.5 mg via INTRAVENOUS
  Administered 2016-04-15: 1 mg via INTRAVENOUS

## 2016-04-15 MED ORDER — SODIUM CHLORIDE 0.9 % IV SOLN
INTRAVENOUS | Status: AC
  Administered 2016-04-15: 16:00:00 via INTRAVENOUS

## 2016-04-15 MED ORDER — VANCOMYCIN HCL IN DEXTROSE 1-5 GM/200ML-% IV SOLN
INTRAVENOUS | Status: AC
  Filled 2016-04-15: qty 200

## 2016-04-15 MED ORDER — MIDAZOLAM HCL 2 MG/2ML IJ SOLN
INTRAMUSCULAR | Status: AC
  Filled 2016-04-15: qty 4

## 2016-04-15 MED ORDER — FENTANYL CITRATE (PF) 100 MCG/2ML IJ SOLN
INTRAMUSCULAR | Status: AC | PRN
  Administered 2016-04-15 (×2): 12.5 ug via INTRAVENOUS
  Administered 2016-04-15: 25 ug via INTRAVENOUS

## 2016-04-15 MED ORDER — SODIUM CHLORIDE 0.9 % IV SOLN
INTRAVENOUS | Status: AC | PRN
  Administered 2016-04-15: 75 mL/h via INTRAVENOUS

## 2016-04-15 MED ORDER — FENTANYL CITRATE (PF) 100 MCG/2ML IJ SOLN
INTRAMUSCULAR | Status: AC
  Filled 2016-04-15: qty 4

## 2016-04-15 MED ORDER — IOPAMIDOL (ISOVUE-300) INJECTION 61%
INTRAVENOUS | Status: AC
  Filled 2016-04-15: qty 50

## 2016-04-15 MED ORDER — BUPIVACAINE HCL (PF) 0.5 % IJ SOLN
INTRAMUSCULAR | Status: AC | PRN
  Administered 2016-04-15: 30 mL

## 2016-04-15 MED ORDER — WARFARIN - PHARMACIST DOSING INPATIENT
Freq: Every day | Status: DC
  Administered 2016-04-15 – 2016-04-17 (×3)

## 2016-04-15 MED ORDER — BUPIVACAINE HCL (PF) 0.25 % IJ SOLN
INTRAMUSCULAR | Status: AC
  Filled 2016-04-15: qty 60

## 2016-04-15 NOTE — Sedation Documentation (Signed)
Patient is resting comfortably. 

## 2016-04-15 NOTE — Sedation Documentation (Signed)
Supply issue for this case.  Holding till VP supplies obtained.  Pt sleeping in NAD.

## 2016-04-15 NOTE — Sedation Documentation (Addendum)
Moved to IR 2, pt resting comfortably.

## 2016-04-15 NOTE — Telephone Encounter (Signed)
[name] daughter call, I called her back. She wanted to know when her mom was scheduled to come down for her procedure. I told her that as an inpatient she would be brought down as soon as it was arranged with Deveshwar, the IR techs, PA's, and the pt's floor. She stated understanding. JM

## 2016-04-15 NOTE — Sedation Documentation (Addendum)
Patient is resting comfortably.  Prep being completed.  Decub prevention patch removed from sacral area for prep

## 2016-04-15 NOTE — Sedation Documentation (Signed)
MD at bedside.  Reviewing images and procedure with pt and daughter.

## 2016-04-15 NOTE — Care Management Important Message (Signed)
Important Message  Patient Details  Name: [name] MRN: [id number] Date of Birth: 31-May-1935   Medicare Important Message Given:  Yes    Bralynn Donado Abena 04/15/2016, 1:59 PM

## 2016-04-15 NOTE — Sedation Documentation (Signed)
MD at bedside.  Dr Estanislado Pandy in, procedure, and questions answered

## 2016-04-15 NOTE — Consult Note (Addendum)
[facility] Nurse wound consult note Reason for Consult: Consult requested for left leg wound.  Daughter at bedside states pt has very thin fragile skin and is on blood thinners and has been followed by the outpatient wound care center in the past for a nonhealing wound related to another hematoma. Wound type: Left outer leg with hematoma related to an abrasion; full thickness wound 3X3.5X.1cm; covered by a blood-filled blister.  Removed loose outer skin, revealing mod amt red drainage, dark red woundbed, and mod amt old bloody drainage, no odor.  Bilat buttocks with 2 stage 2 pressure injuries; each is approx 1X1X.1cm, dry peeling skin surrounding pink dry wound bed.  Daughter assessed all wounds and stated buttocks are improving.   Right anterior calf full thickness wound; .2X.2X.1cm, dry yellow wound bed. No odor or drainage Right posterior thigh with partial thickness abrasion, .2X.2X.1cm Left posterior thigh with partial thickness abrasion, .1X.1X.1cm Pressure injury POA: Yes to bilat buttocks Dressing procedure/placement/frequency: Foam dressings to protect and promote healing to all sites.  Xeroform to hematoma site to promote drying and healing. Hematoma sites are high risk to evolve into eschar as they evolve, despite optimal plan of care. Discussed with patient and daugther and they will follow-up with the outpatient wound care center if the hematoma site does not improve. Please re-consult if further assistance is needed.  Thank-you,  [name] MSN, [place], [place], [place], [place]

## 2016-04-15 NOTE — Progress Notes (Signed)
PT Cancellation Note  Patient Details Name: [name] MRN: [id number] DOB: June 22, 1935   Cancelled Treatment:    Reason Eval/Treat Not Completed: Patient at procedure or test/unavailable (Pt off unit for kyphoplasty, will f/u in am.  )   Vasil Juhasz Eli Hose 04/15/2016, 12:56 PM  Governor [place], PTA pager [phone]

## 2016-04-15 NOTE — Sedation Documentation (Signed)
Patient is resting comfortably.  Tolerating well 

## 2016-04-15 NOTE — Sedation Documentation (Addendum)
MD at bedside.  Daughter present.

## 2016-04-15 NOTE — Sedation Documentation (Addendum)
Pt comfortable, equipment being obtained from [place] now.

## 2016-04-15 NOTE — Sedation Documentation (Signed)
Moved back to bed, tolerated procedure well.

## 2016-04-15 NOTE — Progress Notes (Signed)
SW attempted to speak with patient and give bed offers. However, this pt was not present. SW consulted with nurse who states that pt is out getting a procedure. SW will continue to follow up.  Tilda Burrow, MSW [phone] 04/15/2016 2:38 PM

## 2016-04-15 NOTE — Procedures (Signed)
S/P T 6,T7 and L5 vertebroplasty

## 2016-04-15 NOTE — Progress Notes (Signed)
PROGRESS NOTE    [name]  MMN:[id number] DOB: 03-21-1935 DOA: 04/10/2016 PCP: Rusty Aus, MD    Brief Narrative: [name] is a 81 y.o. female with medical history significant for mitral valve replacement on Coumadin, chronic diastolic CHF, GERD, and hypertension who presents to the emergency department for evaluation of severe pain in the mid and low back. Patient reports suffering a mechanical fall at home approximately 3 weeks ago, and another fall approximately one week ago. She has had severe pain in the mid and lower back since that time, but denies numbness or weakness in the lower extremities, fevers or chills, incontinence, or saddle anesthesia. There was no loss of consciousness or head strike with these falls and she had been evaluated with CT, and later MRI of the lumbar and thoracic spine. Imaging revealed compression fractures of L3, L4, L5, T4, T6, and possibly T7. Other chronic vertebral compression fractures were also noted. Patient has been suffering with severe pain secondary to these injuries despite treatment at home with oxycodone. She has been evaluated by interventional radiology and there was plan for possible kyphoplasty. She presents today due to severe uncontrolled pain despite aggressive treatment with oxycodone at home.    Assessment & Plan:   Principal Problem:   Vertebral compression fracture (HCC) Active Problems:   Chronic combined systolic and diastolic CHF    Long term current use of anticoagulant therapy   H/O mitral valve replacement with mechanical valve   Lymphedema   CKD (chronic kidney disease), stage III   Intractable pain   Spinal compression fracture (HCC)   Malnutrition of moderate degree   Pressure injury of skin   1. Multiple compression fractures with intractable pain  - Pt has suffered 2 mechanical falls at home in the last mo and has been essentially bed-bound with severe pain despite treatment with oxycodone at  home - She has been evaluated by IR outpatient. for possible procedure and will need to have warfarin held for this  - IR is following and much appreciated; they are planning for inpt procedure  -INR  1.2. Heparin on hold for kyphoplasty today. Resume heparin when recommended by Dr Katherina Right.   2. Hx of mitral valve replacement  - Stable, managed with warfarin  - INR subtherapeutic at 1.79 on presentation  - Holding  warfarin for possible procedure.  -Will need to resume Heparin as soon as possible, and when is ok with Dr Corena Pilgrim.  -cardio consulted.   3. CKD stage III  - SCr 1.75 on admission, up from apparent baseline of ~1.4--1.6 - resume lasix increase to home dose today. . Holding  spironolactone.  -follow renal function daily. Stable.   4. Chronic systolic CHF  - TTE (9/0/38) with EF 40-45%, moderate TR - Managed with Lasix, Aldactone, Toprol at home - Follow daily wts and I/O's; continue Toprol   -Continue with 40 mg BID.  -holding  spironolactone due to increase in cr.  -cr stable,.  Bradycardia, asymptomatic, holder parameter for toprol.   5-Right side chest pain, pain reproduce on palpation of her rib right side.   LFT normal, mild elevation of Alkaline phosphatase, GGT normal. Elevation of ALk phosphatase likely from bone.  Cholelithiasis, likely asymptomatic. Further out patient follow up/ .  Dedicated rib x ray  With questionable 9 th rib fracture. Incentive spirometry. Pain management  Constipation; miralax, senna, dulcolax suppository.  Had BM 10-14 and 10-16   6-Elevated Alkaline phosphatase.  GTT normal , Making  elevation of Alk phosphatase less likely related to liver or bile duct  [place] cholelithiasis.   Blister, bloody LE.  Wound care consulted. Appreciate evaluation.  Bilat buttocks with 2 stage 2 pressure injuries; each is approx 1X1X.1cm, dry peeling skin surrounding pink dry wound bed.  [name] assessed all wounds and stated buttocks are improving.      DVT prophylaxis: heparin  Code Status: full code.  Family Communication: [name] at bedside.  Disposition Plan: remain inpatient.    Consultants:   IR  Cardiology    Procedures:   none   Antimicrobials: none   Subjective: She is doing well, denies dyspnea, chest pain.  Had bm this morning.   Objective: Vitals:   04/14/16 2029 04/15/16 0547 04/15/16 0859 04/15/16 1243  BP: 119/60 (!) 135/59 137/86 138/67  Pulse: 61 (!) 59 (!) 59 (!) 56  Resp: 16 16  20   Temp: 97.6 F (36.4 C) 98 F (36.7 C)    TempSrc: Oral Oral    SpO2: 99% 97%  99%  Weight:      Height:        Intake/Output Summary (Last 24 hours) at 04/15/16 1255 Last data filed at 04/15/16 0602  Gross per 24 hour  Intake           636.37 ml  Output                0 ml  Net           636.37 ml   Filed Weights   04/11/16 0426 04/12/16 0658 04/14/16 0409  Weight: 73.5 kg (162 lb 0.6 oz) 73.8 kg (162 lb 11.2 oz) 69.7 kg (153 lb 10.6 oz)    Examination:  General exam: Appears calm and comfortable  Respiratory system: Bilateral crackles. Respiratory effort normal. Cardiovascular system: S1 & S2 heard, RRR. No JVD, murmurs, rubs, gallops or clicks. No pedal edema. Gastrointestinal system: Abdomen is nondistended, soft and nontender. No organomegaly or masses felt. Normal bowel sounds heard. Central nervous system: Alert and oriented. No focal neurological deficits. Extremities: Symmetric 5 x 5 power. Skin: blister left leg, bloody.  Psychiatry: Judgement and insight appear normal. Mood & affect appropriate.     Data Reviewed: I have personally reviewed following labs and imaging studies  CBC:  Recent Labs Lab 04/11/16 0440 04/12/16 0605 04/13/16 0429 04/14/16 0254 04/15/16 0320  WBC 4.7 4.7 4.5 4.6 3.5*  HGB 12.7 12.3 12.1 11.5* 11.7*  HCT 39.9 38.4 38.0 37.3 37.9  MCV 83.3 83.3 83.7 83.8 83.8  PLT 209 200 208 203 177   Basic Metabolic Panel:  Recent Labs Lab 04/11/16 0440  04/12/16 0605 04/13/16 0429 04/14/16 1225 04/15/16 0320  NA 138 139 138 136 138  K 3.5 3.6 3.6 3.7 4.1  CL 101 102 102 98* 102  CO2 29 27 28 27 28   GLUCOSE 88 102* 83 95 96  BUN 23* 21* 24* 20 19  CREATININE 1.65* 1.57* 1.45* 1.49* 1.40*  CALCIUM 9.4 9.2 9.3 9.7 9.5   GFR: Estimated Creatinine Clearance: 31 mL/min (by C-G formula based on SCr of 1.4 mg/dL (H)). Liver Function Tests:  Recent Labs Lab 04/12/16 1130 04/13/16 0429  AST 18 16  ALT 18 20  ALKPHOS 171* 187*  BILITOT 0.9 1.0  PROT 5.6* 5.8*  ALBUMIN 3.1* 3.2*   No results for input(s): LIPASE, AMYLASE in the last 168 hours. No results for input(s): AMMONIA in the last 168 hours. Coagulation Profile:  Recent Labs Lab 04/11/16  1129 04/12/16 0730 04/13/16 0429 04/14/16 0254 04/15/16 0320  INR 1.82 1.68 1.42 1.38 1.24   Cardiac Enzymes: No results for input(s): CKTOTAL, CKMB, CKMBINDEX, TROPONINI in the last 168 hours. BNP (last 3 results) No results for input(s): PROBNP in the last 8760 hours. HbA1C: No results for input(s): HGBA1C in the last 72 hours. CBG: No results for input(s): GLUCAP in the last 168 hours. Lipid Profile: No results for input(s): CHOL, HDL, LDLCALC, TRIG, CHOLHDL, LDLDIRECT in the last 72 hours. Thyroid Function Tests:  Recent Labs  04/13/16 1044  TSH 2.239   Anemia Panel: No results for input(s): VITAMINB12, FOLATE, FERRITIN, TIBC, IRON, RETICCTPCT in the last 72 hours. Sepsis Labs: No results for input(s): PROCALCITON, LATICACIDVEN in the last 168 hours.  Recent Results (from the past 240 hour(s))  Surgical pcr screen     Status: None   Collection Time: 04/11/16 11:04 PM  Result Value Ref Range Status   MRSA, PCR NEGATIVE NEGATIVE Final   Staphylococcus aureus NEGATIVE NEGATIVE Final    Comment:        The Xpert SA Assay (FDA approved for NASAL specimens in patients over 37 years of age), is one component of a comprehensive surveillance program.  Test  performance has been validated by [facility] for patients greater than or equal to 79 year old. It is not intended to diagnose infection nor to guide or monitor treatment.          Radiology Studies: No results found.      Scheduled Meds: . feeding supplement (ENSURE ENLIVE)  237 mL Oral BID BM  . furosemide  40 mg Oral BID  . liver oil-zinc oxide  1 application Topical TID  . metoprolol succinate  25 mg Oral Daily  . pantoprazole  40 mg Oral Daily  . polyethylene glycol  17 g Oral BID  . senna-docusate  1 tablet Oral BID  . sodium chloride flush  3 mL Intravenous Q12H  . vancomycin  1,000 mg Intravenous to XRAY   Continuous Infusions:     LOS: 5 days    Time spent: 35 minutes.     Elmarie Shiley, MD [facility] Pager [phone]  If 7PM-7AM, please contact night-coverage [email or web address] Password [email or web address] 04/15/2016, 12:55 PM

## 2016-04-15 NOTE — Progress Notes (Signed)
ANTICOAGULATION CONSULT NOTE - Follow Up Consult  Pharmacy Consult:  Heparin Indication:  Afib / mechanical MVR  Allergies  Allergen Reactions  . Ace Inhibitors     Other reaction(s): Cough  . Hydrocodone Itching and Nausea Only  . Mercury     Other reaction(s): Unknown  . Nickel   . Silver Dermatitis    Severe itching  . Cephalexin Rash  . Penicillins Rash    Has patient had a PCN reaction causing immediate rash, facial/tongue/throat swelling, SOB or lightheadedness with hypotension: YES Has patient had a PCN reaction causing severe rash involving mucus membranes or skin necrosis: NO Has patient had a PCN reaction that required hospitalization NO Has patient had a PCN reaction occurring within the last 10 years: NO If all of the above answers are "NO", then may proceed with Cephalosporin use.    Patient Measurements: Height: 5\' 6"  (167.6 cm) Weight: 153 lb 10.6 oz (69.7 kg) IBW/kg (Calculated) : 59.3 Heparin Dosing Weight: 73 kg  Vital Signs: Temp: 98 F (36.7 C) (10/16 0547) Temp Source: Oral (10/16 0547) BP: 135/59 (10/16 0547) Pulse Rate: 59 (10/16 0547)  Labs:  Recent Labs  04/13/16 0429 04/14/16 0254 04/14/16 1225 04/15/16 0320  HGB 12.1 11.5*  --  11.7*  HCT 38.0 37.3  --  37.9  PLT 208 203  --  194  LABPROT 17.4* 17.1*  --  15.6*  INR 1.42 1.38  --  1.24  HEPARINUNFRC 0.57 0.46  --  0.53  CREATININE 1.45*  --  1.49* 1.40*    Estimated Creatinine Clearance: 31 mL/min (by C-G formula based on SCr of 1.4 mg/dL (H)).  Assessment: 44 YOF with history of Afib and mechanical MVR to continue on IV heparin while Coumadin is on hold for vertebroplasty/kyphoplasty planned for tomorrow.  Heparin level is therapeutic at 0.53; no bleeding reported, CBC stable. INR is <= 1.5 now.   Goal of Therapy:  Heparin level 0.3-0.7 units/ml Monitor platelets by anticoagulation protocol: Yes   Plan:  - Heparin drip off per order from IR, f/u resume after procedure -  Vertebroplasty/kyphoplasty planned for today - Cardiology recommended heparin bridge post-op - IR please advise when safe to resume heparin drip post-op   Renold Genta, PharmD, BCPS Clinical Pharmacist Phone for today - [place] - [phone] 04/15/2016 7:49 AM

## 2016-04-15 NOTE — Sedation Documentation (Signed)
Patient is resting comfortably.Tolerating procedure well.

## 2016-04-15 NOTE — Progress Notes (Addendum)
ANTICOAGULATION CONSULT NOTE - Follow Up Consult  Pharmacy Consult for heparin and Coumadin Indication: mechanical valve  Allergies  Allergen Reactions  . Ace Inhibitors     Other reaction(s): Cough  . Hydrocodone Itching and Nausea Only  . Mercury     Other reaction(s): Unknown  . Nickel   . Silver Dermatitis    Severe itching  . Cephalexin Rash  . Clindamycin/Lincomycin Rash  . Penicillins Rash    Has patient had a PCN reaction causing immediate rash, facial/tongue/throat swelling, SOB or lightheadedness with hypotension: YES Has patient had a PCN reaction causing severe rash involving mucus membranes or skin necrosis: NO Has patient had a PCN reaction that required hospitalization NO Has patient had a PCN reaction occurring within the last 10 years: NO If all of the above answers are "NO", then may proceed with Cephalosporin use.    Patient Measurements: Height: 5\' 6"  (167.6 cm) Weight: 153 lb 10.6 oz (69.7 kg) IBW/kg (Calculated) : 59.3   Vital Signs: Temp: 98 F (36.7 C) (10/16 0547) Temp Source: Oral (10/16 0547) BP: 122/72 (10/16 1552) Pulse Rate: 59 (10/16 1552)  Labs:  Recent Labs  04/13/16 0429 04/14/16 0254 04/14/16 1225 04/15/16 0320  HGB 12.1 11.5*  --  11.7*  HCT 38.0 37.3  --  37.9  PLT 208 203  --  194  LABPROT 17.4* 17.1*  --  15.6*  INR 1.42 1.38  --  1.24  HEPARINUNFRC 0.57 0.46  --  0.53  CREATININE 1.45*  --  1.49* 1.40*    Estimated Creatinine Clearance: 31 mL/min (by C-G formula based on SCr of 1.4 mg/dL (H)).   Medications:  Scheduled:  . bupivacaine (PF)      . feeding supplement (ENSURE ENLIVE)  237 mL Oral BID BM  . fentaNYL      . furosemide  40 mg Oral BID  . iopamidol      . liver oil-zinc oxide  1 application Topical TID  . metoprolol succinate  25 mg Oral Daily  . midazolam      . pantoprazole  40 mg Oral Daily  . polyethylene glycol  17 g Oral BID  . senna-docusate  1 tablet Oral BID  . sodium chloride flush  3 mL  Intravenous Q12H  . tobramycin      . vancomycin        Assessment: 81yo female s/p T6, T7, and L5 vertebroplasty, to resume heparin at [facility] per d/w Dr. Kathee Delton.  Heparin level was therapeutic this AM on 1100 units/hr with stable hg & pltc.    Coumadin to be resumed as well.  Will give pt larger dose tonight than home dose of 5mg  daily, due to held doses.    Goal of Therapy:  Heparin level 0.3-0.7 units/ml Monitor platelets by anticoagulation protocol: Yes   Plan:  Resume heparin 1100 units/hr at 6PM, no bolus. Check heparin level 8hr after resumed Coumadin 7.5mg  Watch for s/s of bleeding Daily HL, INR   Gracy Bruins, PharmD [facility]

## 2016-04-16 ENCOUNTER — Encounter (HOSPITAL_COMMUNITY): Payer: Self-pay | Admitting: *Deleted

## 2016-04-16 LAB — CBC
HEMATOCRIT: 35.7 % — AB (ref 36.0–46.0)
HEMOGLOBIN: 11.3 g/dL — AB (ref 12.0–15.0)
MCH: 26.5 pg (ref 26.0–34.0)
MCHC: 31.7 g/dL (ref 30.0–36.0)
MCV: 83.8 fL (ref 78.0–100.0)
Platelets: 179 10*3/uL (ref 150–400)
RBC: 4.26 MIL/uL (ref 3.87–5.11)
RDW: 21.1 % — ABNORMAL HIGH (ref 11.5–15.5)
WBC: 5 10*3/uL (ref 4.0–10.5)

## 2016-04-16 LAB — BASIC METABOLIC PANEL
ANION GAP: 7 (ref 5–15)
BUN: 14 mg/dL (ref 6–20)
CHLORIDE: 103 mmol/L (ref 101–111)
CO2: 27 mmol/L (ref 22–32)
Calcium: 9.3 mg/dL (ref 8.9–10.3)
Creatinine, Ser: 1.27 mg/dL — ABNORMAL HIGH (ref 0.44–1.00)
GFR calc non Af Amer: 39 mL/min — ABNORMAL LOW (ref >60)
GFR, EST AFRICAN AMERICAN: 46 mL/min — AB (ref >60)
Glucose, Bld: 92 mg/dL (ref 65–99)
POTASSIUM: 3.8 mmol/L (ref 3.5–5.1)
SODIUM: 137 mmol/L (ref 135–145)

## 2016-04-16 LAB — HEPARIN LEVEL (UNFRACTIONATED): HEPARIN UNFRACTIONATED: 0.31 [IU]/mL (ref 0.30–0.70)

## 2016-04-16 LAB — PROTIME-INR
INR: 1.22
PROTHROMBIN TIME: 15.5 s — AB (ref 11.4–15.2)

## 2016-04-16 MED ORDER — HYDROMORPHONE HCL 2 MG/ML IJ SOLN: 0.5000 mg | INTRAMUSCULAR | Status: DC | PRN

## 2016-04-16 MED ORDER — WARFARIN SODIUM 7.5 MG PO TABS
7.5000 mg | ORAL_TABLET | Freq: Once | ORAL | Status: AC
  Administered 2016-04-16: 7.5 mg via ORAL
  Filled 2016-04-16: qty 1

## 2016-04-16 NOTE — Progress Notes (Signed)
PROGRESS NOTE    [name]  ELF:[id number] DOB: [birth year] DOA: 04/10/2016 PCP: Rusty Aus, MD    Brief Narrative: [name] is a 81 y.o. female with medical history significant for mitral valve replacement on Coumadin, chronic diastolic CHF, GERD, and hypertension who presents to the emergency department for evaluation of severe pain in the mid and low back. Patient reports suffering a mechanical fall at home approximately 3 weeks ago, and another fall approximately one week ago. She has had severe pain in the mid and lower back since that time, but denies numbness or weakness in the lower extremities, fevers or chills, incontinence, or saddle anesthesia. There was no loss of consciousness or head strike with these falls and she had been evaluated with CT, and later MRI of the lumbar and thoracic spine. Imaging revealed compression fractures of L3, L4, L5, T4, T6, and possibly T7. Other chronic vertebral compression fractures were also noted. Patient has been suffering with severe pain secondary to these injuries despite treatment at home with oxycodone. She has been evaluated by interventional radiology and there was plan for possible kyphoplasty. She presents today due to severe uncontrolled pain despite aggressive treatment with oxycodone at home.    Assessment & Plan:   Principal Problem:   Vertebral compression fracture (HCC) Active Problems:   Chronic combined systolic and diastolic CHF    Long term current use of anticoagulant therapy   H/O mitral valve replacement with mechanical valve   Lymphedema   CKD (chronic kidney disease), stage III   Intractable pain   Spinal compression fracture (HCC)   Malnutrition of moderate degree   Pressure injury of skin   1. Multiple compression fractures with intractable pain  - Pt has suffered 2 mechanical falls at home in the last mo and has been essentially bed-bound with severe pain despite treatment with oxycodone at  home - IR is following and much appreciated; they are planning for inpt procedure  -Underwent Kyphoplasty 10-16 for L 5, T 6 and T 7.  -PT consulted.   2. Hx of mitral valve replacement  - Stable, managed with warfarin  - INR subtherapeutic at 1.79 on presentation  - on IV heparin Gtt, coumadin resume.  -cardio consulted. They are available as needed.  -Cardio recommended bridge with heparin.  -INR 1.2  3. CKD stage III  - SCr 1.75 on admission, up from apparent baseline of ~1.4--1.6 - resume lasix increase to home dose today. . Holding  spironolactone.  -follow renal function daily. Stable.   4. Chronic systolic CHF  - TTE (11/06[birth year]50) with EF 40-45%, moderate TR - Managed with Lasix, Aldactone, Toprol at home - Follow daily wts and I/O's; continue Toprol   -Continue with 40 mg BID.  -holding  spironolactone due to increase in cr on admission.  -cr stable,.  Bradycardia, asymptomatic, holder parameter for toprol.  Weight 159--160-- might need to resume spironolactone tomorrow.   5-Right side chest pain, pain reproduce on palpation of her rib right side.   LFT normal, mild elevation of Alkaline phosphatase, GGT normal. Elevation of ALk phosphatase likely from bone.  Cholelithiasis, likely asymptomatic. Further out patient follow up/ .  Dedicated rib x ray  With questionable 9 th rib fracture. Incentive spirometry. Pain management  Constipation; miralax, senna, dulcolax suppository.  Had BM 10-14 and 10-16   6-Elevated Alkaline phosphatase.  GTT normal , Making elevation of Alk phosphatase less likely related to liver or bile duct  [place] cholelithiasis.  Blister, bloody LE.  Wound care consulted. Appreciate evaluation.  Bilat buttocks with 2 stage 2 pressure injuries; each is approx 1X1X.1cm, dry peeling skin surrounding pink dry wound bed.  Daughter assessed all wounds and stated buttocks are improving.     DVT prophylaxis: heparin  Code Status: full code.  Family  Communication: daughter at bedside.  Disposition Plan: remain inpatient. Awaiting INR to be at goal.    Consultants:   IR  Cardiology    Procedures:   none   Antimicrobials: none   Subjective: Report mild back pain. Report soreness in her chest from lying down on her chest for procedure.   Objective: Vitals:   04/15/16 1552 04/15/16 1647 04/15/16 2100 04/16/16 0500  BP: 122/72 (!) 126/52 (!) 126/56 (!) 122/56  Pulse: (!) 59 (!) 56 64 (!) 55  Resp: 18  16 16   Temp:  97.5 F (36.4 C) 97.9 F (36.6 C) 98.7 F (37.1 C)  TempSrc:  Oral Oral Oral  SpO2: 97% 97% 92% 92%  Weight:    72.6 kg (160 lb)  Height:        Intake/Output Summary (Last 24 hours) at 04/16/16 1220 Last data filed at 04/15/16 1822  Gross per 24 hour  Intake              240 ml  Output                0 ml  Net              240 ml   Filed Weights   04/12/16 0658 04/14/16 0409 04/16/16 0500  Weight: 73.8 kg (162 lb 11.2 oz) 69.7 kg (153 lb 10.6 oz) 72.6 kg (160 lb)    Examination:  General exam: Appears calm and comfortable  Respiratory system: Bilateral crackles. Respiratory effort normal. Cardiovascular system: S1 & S2 heard, RRR. No JVD, murmurs, rubs, gallops or clicks. No pedal edema. Gastrointestinal system: Abdomen is nondistended, soft and nontender. No organomegaly or masses felt. Normal bowel sounds heard. Central nervous system: Alert and oriented. No focal neurological deficits. Extremities: Symmetric 5 x 5 power. Skin: blister left leg, bloody.  Psychiatry: Judgement and insight appear normal. Mood & affect appropriate.     Data Reviewed: I have personally reviewed following labs and imaging studies  CBC:  Recent Labs Lab 04/12/16 0605 04/13/16 0429 04/14/16 0254 04/15/16 0320 04/16/16 0215  WBC 4.7 4.5 4.6 3.5* 5.0  HGB 12.3 12.1 11.5* 11.7* 11.3*  HCT 38.4 38.0 37.3 37.9 35.7*  MCV 83.3 83.7 83.8 83.8 83.8  PLT 200 208 203 194 826   Basic Metabolic  Panel:  Recent Labs Lab 04/12/16 0605 04/13/16 0429 04/14/16 1225 04/15/16 0320 04/16/16 0215  NA 139 138 136 138 137  K 3.6 3.6 3.7 4.1 3.8  CL 102 102 98* 102 103  CO2 27 28 27 28 27   GLUCOSE 102* 83 95 96 92  BUN 21* 24* 20 19 14   CREATININE 1.57* 1.45* 1.49* 1.40* 1.27*  CALCIUM 9.2 9.3 9.7 9.5 9.3   GFR: Estimated Creatinine Clearance: 37.2 mL/min (by C-G formula based on SCr of 1.27 mg/dL (H)). Liver Function Tests:  Recent Labs Lab 04/12/16 1130 04/13/16 0429  AST 18 16  ALT 18 20  ALKPHOS 171* 187*  BILITOT 0.9 1.0  PROT 5.6* 5.8*  ALBUMIN 3.1* 3.2*   No results for input(s): LIPASE, AMYLASE in the last 168 hours. No results for input(s): AMMONIA in the last 168 hours. Coagulation Profile:  Recent Labs Lab 04/12/16 0730 04/13/16 0429 04/14/16 0254 04/15/16 0320 04/16/16 0215  INR 1.68 1.42 1.38 1.24 1.22   Cardiac Enzymes: No results for input(s): CKTOTAL, CKMB, CKMBINDEX, TROPONINI in the last 168 hours. BNP (last 3 results) No results for input(s): PROBNP in the last 8760 hours. HbA1C: No results for input(s): HGBA1C in the last 72 hours. CBG: No results for input(s): GLUCAP in the last 168 hours. Lipid Profile: No results for input(s): CHOL, HDL, LDLCALC, TRIG, CHOLHDL, LDLDIRECT in the last 72 hours. Thyroid Function Tests: No results for input(s): TSH, T4TOTAL, FREET4, T3FREE, THYROIDAB in the last 72 hours. Anemia Panel: No results for input(s): VITAMINB12, FOLATE, FERRITIN, TIBC, IRON, RETICCTPCT in the last 72 hours. Sepsis Labs: No results for input(s): PROCALCITON, LATICACIDVEN in the last 168 hours.  Recent Results (from the past 240 hour(s))  Surgical pcr screen     Status: None   Collection Time: 04/11/16 11:04 PM  Result Value Ref Range Status   MRSA, PCR NEGATIVE NEGATIVE Final   Staphylococcus aureus NEGATIVE NEGATIVE Final    Comment:        The Xpert SA Assay (FDA approved for NASAL specimens in patients over 21 years  of age), is one component of a comprehensive surveillance program.  Test performance has been validated by [facility] for patients greater than or equal to 31 year old. It is not intended to diagnose infection nor to guide or monitor treatment.          Radiology Studies: No results found.      Scheduled Meds: . feeding supplement (ENSURE ENLIVE)  237 mL Oral BID BM  . furosemide  40 mg Oral BID  . liver oil-zinc oxide  1 application Topical TID  . metoprolol succinate  25 mg Oral Daily  . pantoprazole  40 mg Oral Daily  . polyethylene glycol  17 g Oral BID  . senna-docusate  1 tablet Oral BID  . sodium chloride flush  3 mL Intravenous Q12H  . warfarin  7.5 mg Oral ONCE-1800  . Warfarin - Pharmacist Dosing Inpatient   Does not apply q1800   Continuous Infusions: . heparin 1,100 Units/hr (04/15/16 1822)     LOS: 6 days    Time spent: 35 minutes.     Elmarie Shiley, MD [facility] Pager [phone]  If 7PM-7AM, please contact night-coverage [email or web address] Password [email or web address] 04/16/2016, 12:20 PM

## 2016-04-16 NOTE — Progress Notes (Signed)
Physical Therapy Treatment Patient Details Name: [name] MRN: [id number] DOB: 09/12/1934 Today's Date: 04/16/2016    History of Present Illness 81 yo female with history of Mitral valve replacement and CHF on coumadin, admitted with severe pain, post multiple falls over past 3 weeks at home.  Imaging shows spine compression fractures T4, T6, T7, L3, L4, L5.  S/p kyphoplasty (04/15/16).    PT Comments    Pt progressing slowly with mobility during PT sessions. Pt able to ambulate 40 ft with rw with O2 sats dropping to 83% on RA (>90% on 3L within 1 minute). Time spent with pt and daughter discussing recommendation of PT for SNF. Pt reporting that she will consider this but still wants to go home.    Follow Up Recommendations  SNF     Equipment Recommendations  None recommended by PT    Recommendations for Other Services       Precautions / Restrictions Precautions Precautions: Fall Precaution Comments: SpO2 Restrictions Weight Bearing Restrictions: No    Mobility  Bed Mobility               General bed mobility comments: pt in chair upon arrival  Transfers Overall transfer level: Needs assistance Equipment used: Rolling walker (2 wheeled) Transfers: Sit to/from Stand Sit to Stand: Mod assist         General transfer comment: cues for hand placement and assist for stability with initial standing.   Ambulation/Gait Ambulation/Gait assistance: Min guard Ambulation Distance (Feet): 40 Feet Assistive device: Rolling walker (2 wheeled) Gait Pattern/deviations: Step-through pattern;Trunk flexed     General Gait Details: slow pattern with noted instability but no gross loss of balance.    Stairs            Wheelchair Mobility    Modified Rankin (Stroke Patients Only)       Balance Overall balance assessment: Needs assistance Sitting-balance support: Bilateral upper extremity supported Sitting balance-Leahy Scale: Poor     Standing  balance support: Bilateral upper extremity supported Standing balance-Leahy Scale: Poor Standing balance comment: using rw                    Cognition Arousal/Alertness: Awake/alert Behavior During Therapy: WFL for tasks assessed/performed Overall Cognitive Status: Within Functional Limits for tasks assessed                      Exercises      General Comments General comments (skin integrity, edema, etc.): SpO2 98% on RA prior to ambulation and dropping to 83% upon returning. Pt placed on 3L and SpO2 increasing to >90% within 1 minute.  Patient's son Hold calling an requesting update with PT. Patient giving permission for PT to talk with her son regarding progress.       Pertinent Vitals/Pain Pain Assessment: 0-10 Pain Score: 9  Pain Location: back/neck Pain Descriptors / Indicators: Aching Pain Intervention(s): Limited activity within patient's tolerance;Monitored during session    Home Living                      Prior Function            PT Goals (current goals can now be found in the care plan section) Acute Rehab PT Goals Patient Stated Goal: To go home PT Goal Formulation: With patient Time For Goal Achievement: 04/27/16 Potential to Achieve Goals: Good Progress towards PT goals: Progressing toward goals    Frequency  Min 3X/week      PT Plan Current plan remains appropriate    Co-evaluation             End of Session Equipment Utilized During Treatment: Gait belt Activity Tolerance: Patient limited by fatigue;Patient limited by pain Patient left: in chair;with call bell/phone within reach;with family/visitor present     Time: 1121-6244 PT Time Calculation (min) (ACUTE ONLY): 37 min  Charges:  $Gait Training: 23-37 mins                    G Codes:      Cassell Clement, PT, CSCS Pager [phone] Office [phone]  04/16/2016, 4:23 PM

## 2016-04-16 NOTE — Discharge Instructions (Signed)
1.No stooping,bending or lifting more than 10 lbs for 2 weeks. 2.Use walker to ambulate.   -------------------------------------------- Information on my medicine - Coumadin   (Warfarin)  This medication education was reviewed with me or my healthcare representative as part of my discharge preparation.   Why was Coumadin prescribed for you? Coumadin was prescribed for you because you have a blood clot or a medical condition that can cause an increased risk of forming blood clots. Blood clots can cause serious health problems by blocking the flow of blood to the heart, lung, or brain. Coumadin can prevent harmful blood clots from forming. As a reminder your indication for Coumadin is:   Stroke Prevention Because Of Atrial Fibrillation  What test will check on my response to Coumadin? While on Coumadin (warfarin) you will need to have an INR test regularly to ensure that your dose is keeping you in the desired range. The INR (international normalized ratio) number is calculated from the result of the laboratory test called prothrombin time (PT).  If an INR APPOINTMENT HAS NOT ALREADY BEEN MADE FOR YOU please schedule an appointment to have this lab work done by your health care provider within 7 days. Your INR goal is usually a number between:  2 to 3 or your provider may give you a more narrow range like 2-2.5.  Ask your health care provider during an office visit what your goal INR is.  What  do you need to  know  About  COUMADIN? Take Coumadin (warfarin) exactly as prescribed by your healthcare provider about the same time each day.  DO NOT stop taking without talking to the doctor who prescribed the medication.  Stopping without other blood clot prevention medication to take the place of Coumadin may increase your risk of developing a new clot or stroke.  Get refills before you run out.  What do you do if you miss a dose? If you miss a dose, take it as soon as you remember on the same day  then continue your regularly scheduled regimen the next day.  Do not take two doses of Coumadin at the same time.  Important Safety Information A possible side effect of Coumadin (Warfarin) is an increased risk of bleeding. You should call your healthcare provider right away if you experience any of the following: ? Bleeding from an injury or your nose that does not stop. ? Unusual colored urine (red or dark brown) or unusual colored stools (red or black). ? Unusual bruising for unknown reasons. ? A serious fall or if you hit your head (even if there is no bleeding).  Some foods or medicines interact with Coumadin (warfarin) and might alter your response to warfarin. To help avoid this: ? Eat a balanced diet, maintaining a consistent amount of Vitamin K. ? Notify your provider about major diet changes you plan to make. ? Avoid alcohol or limit your intake to 1 drink for women and 2 drinks for men per day. (1 drink is 5 oz. wine, 12 oz. beer, or 1.5 oz. liquor.)  Make sure that ANY health care provider who prescribes medication for you knows that you are taking Coumadin (warfarin).  Also make sure the healthcare provider who is monitoring your Coumadin knows when you have started a new medication including herbals and non-prescription products.  Coumadin (Warfarin)  Major Drug Interactions  Increased Warfarin Effect Decreased Warfarin Effect  Alcohol (large quantities) Antibiotics (esp. Septra/Bactrim, Flagyl, Cipro) Amiodarone (Cordarone) Aspirin (ASA) Cimetidine (Tagamet) Megestrol (Megace)  NSAIDs (ibuprofen, naproxen, etc.) Piroxicam (Feldene) Propafenone (Rythmol SR) Propranolol (Inderal) Isoniazid (INH) Posaconazole (Noxafil) Barbiturates (Phenobarbital) Carbamazepine (Tegretol) Chlordiazepoxide (Librium) Cholestyramine (Questran) Griseofulvin Oral Contraceptives Rifampin Sucralfate (Carafate) Vitamin K   Coumadin (Warfarin) Major Herbal Interactions  Increased  Warfarin Effect Decreased Warfarin Effect  Garlic Ginseng Ginkgo biloba Coenzyme Q10 Green tea St. Johns wort    Coumadin (Warfarin) FOOD Interactions  Eat a consistent number of servings per week of foods HIGH in Vitamin K (1 serving =  cup)  Collards (cooked, or boiled & drained) Kale (cooked, or boiled & drained) Mustard greens (cooked, or boiled & drained) Parsley *serving size only =  cup Spinach (cooked, or boiled & drained) Swiss chard (cooked, or boiled & drained) Turnip greens (cooked, or boiled & drained)  Eat a consistent number of servings per week of foods MEDIUM-HIGH in Vitamin K (1 serving = 1 cup)  Asparagus (cooked, or boiled & drained) Broccoli (cooked, boiled & drained, or raw & chopped) Brussel sprouts (cooked, or boiled & drained) *serving size only =  cup Lettuce, raw (green leaf, endive, romaine) Spinach, raw Turnip greens, raw & chopped   These websites have more information on Coumadin (warfarin):  FailFactory.se; VeganReport.com.au;

## 2016-04-16 NOTE — Progress Notes (Signed)
[place] for heparin and Coumadin Indication: mechanical valve  Allergies  Allergen Reactions  . Ace Inhibitors     Other reaction(s): Cough  . Hydrocodone Itching and Nausea Only  . Mercury     Other reaction(s): Unknown  . Nickel   . Silver Dermatitis    Severe itching  . Cephalexin Rash  . Clindamycin/Lincomycin Rash  . Penicillins Rash    Has patient had a PCN reaction causing immediate rash, facial/tongue/throat swelling, SOB or lightheadedness with hypotension: YES Has patient had a PCN reaction causing severe rash involving mucus membranes or skin necrosis: NO Has patient had a PCN reaction that required hospitalization NO Has patient had a PCN reaction occurring within the last 10 years: NO If all of the above answers are "NO", then may proceed with Cephalosporin use.    Patient Measurements: Height: 5\' 6"  (167.6 cm) Weight: 153 lb 10.6 oz (69.7 kg) IBW/kg (Calculated) : 59.3   Vital Signs: Temp: 97.9 F (36.6 C) (10/16 2100) Temp Source: Oral (10/16 2100) BP: 126/56 (10/16 2100) Pulse Rate: 64 (10/16 2100)  Labs:  Recent Labs  04/14/16 0254 04/14/16 1225 04/15/16 0320 04/16/16 0215  HGB 11.5*  --  11.7* 11.3*  HCT 37.3  --  37.9 35.7*  PLT 203  --  194 179  LABPROT 17.1*  --  15.6* 15.5*  INR 1.38  --  1.24 1.22  HEPARINUNFRC 0.46  --  0.53 0.31  CREATININE  --  1.49* 1.40* 1.27*    Estimated Creatinine Clearance: 34.2 mL/min (by C-G formula based on SCr of 1.27 mg/dL (H)).  Assessment: 81yo female s/p vertebroplasty, h/o mechanical MVR, for anticoagulation  Goal of Therapy:  INR 2.5-3.5 Heparin level 0.3-0.7 units/ml Monitor platelets by anticoagulation protocol: Yes   Plan:  Continue Heparin at current rate  Coumadin 7.5 mg today  Phillis Knack, PharmD, BCPS

## 2016-04-16 NOTE — Progress Notes (Signed)
Referring Physician(s): Dr Niel Hummer  Supervising Physician: Luanne Bras  Patient Status:  [facility] - In-pt  Chief Complaint:  Back pain  Subjective:  Ms Tibbitts says she thinks she is better today. She says it is hard for her to tell because she is slow to move.  She does state that the "pain around her side" is better and her back is feeling better.  Allergies: Ace inhibitors; Hydrocodone; Mercury; Nickel; Silver; Cephalexin; Clindamycin/lincomycin; and Penicillins  Medications: Prior to Admission medications   Medication Sig Start Date End Date Taking? Authorizing Provider  acetaminophen (TYLENOL) 500 MG tablet Take 500 mg by mouth every 6 (six) hours as needed for mild pain, moderate pain, fever or headache.   Yes Historical Provider, MD  aspirin EC 81 MG tablet Take 81 mg by mouth daily.    Yes Historical Provider, MD  furosemide (LASIX) 40 MG tablet Take 40 mg by mouth 2 (two) times daily.    Yes Historical Provider, MD  liver oil-zinc oxide (DESITIN) 40 % ointment Apply 1 application topically 3 (three) times daily. Apply to bed sore   Yes Historical Provider, MD  metoprolol succinate (TOPROL-XL) 25 MG 24 hr tablet Take 1 tablet (25 mg total) by mouth daily. 10/10/15  Yes Gladstone Lighter, MD  oxyCODONE (OXY IR/ROXICODONE) 5 MG immediate release tablet Take 5 mg by mouth every 4 (four) hours.  03/28/16  Yes Historical Provider, MD  pantoprazole (PROTONIX) 40 MG tablet Take 40 mg by mouth daily.    Yes Historical Provider, MD  spironolactone (ALDACTONE) 25 MG tablet Take 25 mg by mouth 2 (two) times daily.    Yes Historical Provider, MD  warfarin (COUMADIN) 5 MG tablet Take 1 tablet (5 mg total) by mouth daily. 10/10/15  Yes Gladstone Lighter, MD  enoxaparin (LOVENOX) 80 MG/0.8ML injection Inject 0.8 mLs (80 mg total) into the skin every 12 (twelve) hours. Patient not taking: Reported on 04/10/2016 04/04/16   Katha Cabal, MD     Vital Signs: BP (!) 122/56  (BP Location: Right Arm)   Pulse (!) 55   Temp 98.7 F (37.1 C) (Oral)   Resp 16   Ht 5\' 6"  (1.676 m)   Wt 160 lb (72.6 kg)   SpO2 92%   BMI 25.82 kg/m   Physical Exam  Awake and alert Sitting up in chair Stick sites with some blood on the bandage Bandage removed, no bleeding, clean bandage replaced No tenderness at thoracic site. Could not see lumbar site due to patient sitting in chair.  Imaging: Dg Ribs Unilateral Right  Result Date: 04/12/2016 CLINICAL DATA:  81 year old female status post fall several weeks ago. Right side rib pain. T4, T6, and T7 vertebral body fractures noted by MRI in September. Subsequent encounter. EXAM: RIGHT RIBS - 2 VIEW COMPARISON:  Chest radiographs 10/10/2015 and earlier. Thoracic spine MRI 03/21/2016 FINDINGS: Right rib marker placed at the right lateral ninth or tenth rib level near the costochondral junctions. No displaced right rib fracture identified. Questionable nondisplaced right posterior lateral ninth rib fracture (arrow) Osteopenia. No right pneumothorax or pleural effusion. Subsegmental right lung base atelectasis. Stable visible cardiomegaly and mediastinal contours status post prior sternotomy and cardiac valve replacement. IMPRESSION: 1. Osteopenia. Questionable nondisplaced right posterior lateral ninth rib fracture. No displaced right rib fracture identified. 2. Subsegmental atelectasis at the right lung base. Electronically Signed   By: Genevie Ann M.D.   On: 04/12/2016 16:07   US Abdomen Limited Ruq  Result Date:  04/13/2016 CLINICAL DATA:  Abdominal pain for 1 week. EXAM: US ABDOMEN LIMITED - RIGHT UPPER QUADRANT COMPARISON:  None. FINDINGS: Gallbladder: Layering sludge and multiple stones identified in the lumen. Gallbladder wall thickness upper normal at 3 mm. No pericholecystic fluid in the sonographer reports no sonographic Murphy sign. Common bile duct: Diameter: 5 mm Liver: 7 mm hypoechoic lesion inferior liver, likely a cyst.  IMPRESSION: Cholelithiasis. Electronically Signed   By: Misty Stanley M.D.   On: 04/13/2016 08:50    Labs:  CBC:  Recent Labs  04/13/16 0429 04/14/16 0254 04/15/16 0320 04/16/16 0215  WBC 4.5 4.6 3.5* 5.0  HGB 12.1 11.5* 11.7* 11.3*  HCT 38.0 37.3 37.9 35.7*  PLT 208 203 194 179    COAGS:  Recent Labs  04/13/16 0429 04/14/16 0254 04/15/16 0320 04/16/16 0215  INR 1.42 1.38 1.24 1.22    BMP:  Recent Labs  04/13/16 0429 04/14/16 1225 04/15/16 0320 04/16/16 0215  NA 138 136 138 137  K 3.6 3.7 4.1 3.8  CL 102 98* 102 103  CO2 28 27 28 27   GLUCOSE 83 95 96 92  BUN 24* 20 19 14   CALCIUM 9.3 9.7 9.5 9.3  CREATININE 1.45* 1.49* 1.40* 1.27*  GFRNONAA 34* 32* 35* 39*  GFRAA 39* 38* 41* 46*    LIVER FUNCTION TESTS:  Recent Labs  10/02/15 0506 04/12/16 1130 04/13/16 0429  BILITOT  --  0.9 1.0  AST  --  18 16  ALT  --  18 20  ALKPHOS  --  171* 187*  PROT  --  5.6* 5.8*  ALBUMIN 4.0 3.1* 3.2*    Assessment and Plan:  S/P L5 and T6/T7 kyphoplasty/vertebroplasty by Dr. Estanislado Pandy on 04/16/2016 Anticoagulation has been restarted Recommend PT/OT evals   Electronically Signed: Murrell Redden PA-C 04/16/2016, 9:48 AM   I spent a total of 15 Minutes at the the patient's bedside AND on the patient's hospital floor or unit, greater than 50% of which was counseling/coordinating care for f/u after vertebroplasty

## 2016-04-16 NOTE — Progress Notes (Signed)
SW provided daughter and patient with facilities who have offered a bed. Daughter states that she does not have any questions for SW at this time.  Tilda Burrow, MSW [phone] 04/16/2016 3:13 PM

## 2016-04-16 NOTE — Progress Notes (Signed)
PASRR: [id number] Krystal Clark, MSW [phone] 04/16/2016 12:02 PM

## 2016-04-17 ENCOUNTER — Encounter (HOSPITAL_COMMUNITY): Payer: Self-pay | Admitting: Interventional Radiology

## 2016-04-17 LAB — HEPARIN LEVEL (UNFRACTIONATED)
HEPARIN UNFRACTIONATED: 0.39 [IU]/mL (ref 0.30–0.70)
Heparin Unfractionated: <0.1 IU/mL — ABNORMAL LOW (ref 0.30–0.70)

## 2016-04-17 LAB — BASIC METABOLIC PANEL
Anion gap: 10 (ref 5–15)
BUN: 15 mg/dL (ref 6–20)
CHLORIDE: 103 mmol/L (ref 101–111)
CO2: 24 mmol/L (ref 22–32)
Calcium: 9.5 mg/dL (ref 8.9–10.3)
Creatinine, Ser: 1.32 mg/dL — ABNORMAL HIGH (ref 0.44–1.00)
GFR calc Af Amer: 44 mL/min — ABNORMAL LOW (ref >60)
GFR calc non Af Amer: 38 mL/min — ABNORMAL LOW (ref >60)
GLUCOSE: 94 mg/dL (ref 65–99)
POTASSIUM: 3.8 mmol/L (ref 3.5–5.1)
Sodium: 137 mmol/L (ref 135–145)

## 2016-04-17 LAB — CBC
HEMATOCRIT: 39 % (ref 36.0–46.0)
Hemoglobin: 12.3 g/dL (ref 12.0–15.0)
MCH: 26.3 pg (ref 26.0–34.0)
MCHC: 31.5 g/dL (ref 30.0–36.0)
MCV: 83.5 fL (ref 78.0–100.0)
Platelets: 185 10*3/uL (ref 150–400)
RBC: 4.67 MIL/uL (ref 3.87–5.11)
RDW: 21.3 % — ABNORMAL HIGH (ref 11.5–15.5)
WBC: 5.2 10*3/uL (ref 4.0–10.5)

## 2016-04-17 LAB — PROTIME-INR
INR: 1.32
PROTHROMBIN TIME: 16.5 s — AB (ref 11.4–15.2)

## 2016-04-17 MED ORDER — WARFARIN SODIUM 5 MG PO TABS
10.0000 mg | ORAL_TABLET | Freq: Once | ORAL | Status: AC
  Administered 2016-04-17: 10 mg via ORAL
  Filled 2016-04-17: qty 2

## 2016-04-17 NOTE — Progress Notes (Addendum)
CSW spoke with pt's son Helene Kelp [phone] re SNF choice. [name] son to [place] today and will notify CSW of decision. CSW also met with pt who reports agreeable to SNF.   Wandra Feinstein, MSW, LCSW [phone] (coverage)

## 2016-04-17 NOTE — Progress Notes (Signed)
ANTICOAGULATION CONSULT NOTE - Follow Up Consult  Pharmacy Consult for Heparin and Coumadin Indication: St. Jude MVR and Afib  Patient Measurements: Height: 5\' 6"  (167.6 cm) Weight: 158 lb 3.2 oz (71.8 kg) IBW/kg (Calculated) : 59.3 Heparin Dosing Weight: 72 kg  Labs:  Recent Labs  04/15/16 0320 04/16/16 0215 04/17/16 0625 04/17/16 1637  HGB 11.7* 11.3* 12.3  --   HCT 37.9 35.7* 39.0  --   PLT 194 179 185  --   LABPROT 15.6* 15.5* 16.5*  --   INR 1.24 1.22 1.32  --   HEPARINUNFRC 0.53 0.31 <0.10* 0.39  CREATININE 1.40* 1.27* 1.32*  --     Estimated Creatinine Clearance: 35.7 mL/min (by C-G formula based on SCr of 1.32 mg/dL (H)).  Assessment:   Heparin level is therapeutic (0.39) this afternoon on 1100 units/hr, about 8 hours after drip resumed, after found not to be running this morning.     Goal of Therapy:  Heparin level 0.3-0.7 units/ml INR 2.5-3.5 Monitor platelets by anticoagulation protocol: Yes   Plan:   Continue heparin drip at 1100 units/hr.  Coumadin 10 mg given today.   Next heparin level, CBC and PT/INR in am.  Arty Baumgartner, Bright Pager: [phone] 04/17/2016,5:38 PM

## 2016-04-17 NOTE — Progress Notes (Signed)
ANTICOAGULATION CONSULT NOTE - Follow Up Consult  Pharmacy Consult for Heparin + Warfarin Indication: St. Jude MVR & Afib  Allergies  Allergen Reactions  . Ace Inhibitors     Other reaction(s): Cough  . Hydrocodone Itching and Nausea Only  . Mercury     Other reaction(s): Unknown  . Nickel   . Silver Dermatitis    Severe itching  . Cephalexin Rash  . Clindamycin/Lincomycin Rash  . Penicillins Rash    Has patient had a PCN reaction causing immediate rash, facial/tongue/throat swelling, SOB or lightheadedness with hypotension: YES Has patient had a PCN reaction causing severe rash involving mucus membranes or skin necrosis: NO Has patient had a PCN reaction that required hospitalization NO Has patient had a PCN reaction occurring within the last 10 years: NO If all of the above answers are "NO", then may proceed with Cephalosporin use.    Patient Measurements: Height: 5\' 6"  (167.6 cm) Weight: 158 lb 3.2 oz (71.8 kg) IBW/kg (Calculated) : 59.3 Heparin Dosing Weight: 72 kg  Vital Signs: Temp: 98 F (36.7 C) (10/18 0435) Temp Source: Oral (10/18 0435) BP: 138/63 (10/18 0435) Pulse Rate: 58 (10/18 0435)  Labs:  Recent Labs  04/15/16 0320 04/16/16 0215 04/17/16 0625  HGB 11.7* 11.3* 12.3  HCT 37.9 35.7* 39.0  PLT 194 179 185  LABPROT 15.6* 15.5* 16.5*  INR 1.24 1.22 1.32  HEPARINUNFRC 0.53 0.31 <0.10*  CREATININE 1.40* 1.27* 1.32*    Estimated Creatinine Clearance: 35.7 mL/min (by C-G formula based on SCr of 1.32 mg/dL (H)).   Medications:  Heparin OFF - SZP reported Warfarin doses: 7.5 mg (10/16), 7.5 mg (10/17)  Assessment: 80 YOF on warfarin PTA for hx St. Jude MVR and Afib - held this admission for vertebroplasy/kyphoplasy and bridged with heparin. Warfarin was resumed post-op on 10/16 and the patient was to continue on a heparin bridge.   The heparin level this morning resulted as undetectable (HL<0.1). Upon investigation, it was discovered that the drip  was not hanging or even in the patient's room. Given the patient's therapeutic heparin level on 10/17 AM - it is assumed that the drip was turned off at some point on dayshift on 10/17 or nightshift over 10/17-10/18. There are not chart notes or MAR documentation to determine at what point it was stopped. The doctor was informed and a SZP reported. The heparin drip was resumed as ordered on 10/18 AM.  The INR remains SUBtherapeutic however is trending up (INR 1.32 << 1.22, goal of 2.5-3.5). PTA the patient was on 5 mg daily and has been receiving 7.5 mg (1.5x home dose) the past 2 days. Will give a 10 mg dose this evening and monitor trends.   CBC wnl - no active signs/symptoms of bleeding are noted at this time.   *Was called to the patient's room to discuss heparin drip and warfarin. All of the patient's questions were addressed and the patient is aware that she needs to continue on heparin at this time. *   Goal of Therapy:  Heparin level 0.3-0.7 units/ml INR 2.5-3.5 Monitor platelets by anticoagulation protocol: Yes   Plan:  1. Restart heparin at 1100 units/hr (11 ml/hr) - will hold a bolus due to the recent procedure 2. Warfarin 10 mg x 1 dose at 1800 today 3. Will continue to monitor for any signs/symptoms of bleeding and will follow up with heparin level in 8 hours and PT/INR in the AM  Thank you for allowing pharmacy to be a  part of this patient's care.  Alycia Rossetti, PharmD, BCPS Clinical Pharmacist Pager: [phone] Clinical phone for 04/17/2016 from 7a-3:30p: [phone] If after 3:30p, please call main pharmacy at: [phone] 04/17/2016 8:33 AM

## 2016-04-17 NOTE — Progress Notes (Signed)
PROGRESS NOTE    [name]  RFF:[id number] DOB: [birth year] DOA: 04/10/2016 PCP: Rusty Aus, MD    Brief Narrative: [name] is a 81 y.o. female with medical history significant for mitral valve replacement on Coumadin, chronic diastolic CHF, GERD, and hypertension who presents to the emergency department for evaluation of severe pain in the mid and low back. Patient reports suffering a mechanical fall at home approximately 3 weeks ago, and another fall approximately one week ago. She has had severe pain in the mid and lower back since that time, but denies numbness or weakness in the lower extremities, fevers or chills, incontinence, or saddle anesthesia. There was no loss of consciousness or head strike with these falls and she had been evaluated with CT, and later MRI of the lumbar and thoracic spine. Imaging revealed compression fractures of L3, L4, L5, T4, T6, and possibly T7. Other chronic vertebral compression fractures were also noted. Patient has been suffering with severe pain secondary to these injuries despite treatment at home with oxycodone. She has been evaluated by interventional radiology and there was plan for possible kyphoplasty. She presents today due to severe uncontrolled pain despite aggressive treatment with oxycodone at home.    Assessment & Plan:   Principal Problem:   Vertebral compression fracture (HCC) Active Problems:   Chronic combined systolic and diastolic CHF    Long term current use of anticoagulant therapy   H/O mitral valve replacement with mechanical valve   Lymphedema   CKD (chronic kidney disease), stage III   Intractable pain   Spinal compression fracture (HCC)   Malnutrition of moderate degree   Pressure injury of skin   1. Multiple compression fractures with intractable pain  - Pt has suffered 2 mechanical falls at home in the last mo and has been essentially bed-bound with severe pain despite treatment with oxycodone at  home - IR is following and much appreciated; they are planning for inpt procedure  -Underwent Kyphoplasty 10-16 for L 5, T 6 and T 7.  -PT consulted.   2. Hx of mitral valve replacement Mechanical - Stable, managed with warfarin  - INR subtherapeutic at 1.79 on presentation  - on IV heparin Gtt, coumadin resume.  -cardio consulted. They are available as needed.  -Cardio recommended bridge with heparin.  -INR 1.3 Continue with heparin gtt, patient was off heparin overnight , no clear reason.   3. CKD stage III  - SCr 1.75 on admission, up from apparent baseline of ~1.4--1.6 - resume lasix increase to home dose today. . Holding  spironolactone.  -follow renal function daily. Stable.   4. Chronic systolic CHF  - TTE (09/02/68) with EF 40-45%, moderate TR - Managed with Lasix, Aldactone, Toprol at home - Follow daily wts and I/O's; continue Toprol   -Continue with 40 mg BID.  -holding  spironolactone due to increase in cr on admission.  -cr stable,.  Bradycardia, asymptomatic, holder parameter for toprol.  Weight 159--160-- might need to resume spironolactone tomorrow.  Denies dyspnea.   5-Right side chest pain, pain reproduce on palpation of her rib right side.   LFT normal, mild elevation of Alkaline phosphatase, GGT normal. Elevation of ALk phosphatase likely from bone.  Cholelithiasis, likely asymptomatic. Further out patient follow up/ .  Dedicated rib x ray  With questionable 9 th rib fracture. Incentive spirometry. Pain management  Constipation; miralax, senna, dulcolax suppository.  Had BM 10-14 and 10-16   6-Elevated Alkaline phosphatase.  GTT normal ,  Making elevation of Alk phosphatase less likely related to liver or bile duct  [place] cholelithiasis.   Blister, bloody LE.  Wound care consulted. Appreciate evaluation.  Bilat buttocks with 2 stage 2 pressure injuries; each is approx 1X1X.1cm, dry peeling skin surrounding pink dry wound bed.  Daughter assessed all wounds  and stated buttocks are improving.     DVT prophylaxis: heparin  Code Status: full code.  Family Communication: Care discussed with patient.   Disposition Plan: remain inpatient. Awaiting INR to be at goal.    Consultants:   IR  Cardiology    Procedures:   none   Antimicrobials: none   Subjective: Having mild neck pain,  Denies chest pain, dyspnea.    Objective: Vitals:   04/16/16 2000 04/17/16 0435 04/17/16 0505 04/17/16 1237  BP: (!) 124/55 138/63  122/67  Pulse: 61 (!) 58  65  Resp: 15 15  16   Temp: 98.2 F (36.8 C) 98 F (36.7 C)  97.5 F (36.4 C)  TempSrc: Oral Oral  Oral  SpO2: 90% 99%  95%  Weight:   71.8 kg (158 lb 3.2 oz)   Height:       No intake or output data in the 24 hours ending 04/17/16 1402 Filed Weights   04/14/16 0409 04/16/16 0500 04/17/16 0505  Weight: 69.7 kg (153 lb 10.6 oz) 72.6 kg (160 lb) 71.8 kg (158 lb 3.2 oz)    Examination:  General exam: Appears calm and comfortable  Respiratory system: Bilateral crackles. Respiratory effort normal. Cardiovascular system: S1 & S2 heard, RRR. Mechanical valve clips.  No JVD, murmurs, rubs, gallops, . No pedal edema. Gastrointestinal system: Abdomen is nondistended, soft and nontender. No organomegaly or masses felt. Normal bowel sounds heard. Central nervous system: Alert and oriented. No focal neurological deficits. Extremities: Symmetric 5 x 5 power. Skin: blister left leg, bloody.  Psychiatry: Judgement and insight appear normal. Mood & affect appropriate.     Data Reviewed: I have personally reviewed following labs and imaging studies  CBC:  Recent Labs Lab 04/13/16 0429 04/14/16 0254 04/15/16 0320 04/16/16 0215 04/17/16 0625  WBC 4.5 4.6 3.5* 5.0 5.2  HGB 12.1 11.5* 11.7* 11.3* 12.3  HCT 38.0 37.3 37.9 35.7* 39.0  MCV 83.7 83.8 83.8 83.8 83.5  PLT 208 203 194 179 283   Basic Metabolic Panel:  Recent Labs Lab 04/13/16 0429 04/14/16 1225 04/15/16 0320 04/16/16 0215  04/17/16 0625  NA 138 136 138 137 137  K 3.6 3.7 4.1 3.8 3.8  CL 102 98* 102 103 103  CO2 28 27 28 27 24   GLUCOSE 83 95 96 92 94  BUN 24* 20 19 14 15   CREATININE 1.45* 1.49* 1.40* 1.27* 1.32*  CALCIUM 9.3 9.7 9.5 9.3 9.5   GFR: Estimated Creatinine Clearance: 35.7 mL/min (by C-G formula based on SCr of 1.32 mg/dL (H)). Liver Function Tests:  Recent Labs Lab 04/12/16 1130 04/13/16 0429  AST 18 16  ALT 18 20  ALKPHOS 171* 187*  BILITOT 0.9 1.0  PROT 5.6* 5.8*  ALBUMIN 3.1* 3.2*   No results for input(s): LIPASE, AMYLASE in the last 168 hours. No results for input(s): AMMONIA in the last 168 hours. Coagulation Profile:  Recent Labs Lab 04/13/16 0429 04/14/16 0254 04/15/16 0320 04/16/16 0215 04/17/16 0625  INR 1.42 1.38 1.24 1.22 1.32   Cardiac Enzymes: No results for input(s): CKTOTAL, CKMB, CKMBINDEX, TROPONINI in the last 168 hours. BNP (last 3 results) No results for input(s): PROBNP in the  last 8760 hours. HbA1C: No results for input(s): HGBA1C in the last 72 hours. CBG: No results for input(s): GLUCAP in the last 168 hours. Lipid Profile: No results for input(s): CHOL, HDL, LDLCALC, TRIG, CHOLHDL, LDLDIRECT in the last 72 hours. Thyroid Function Tests: No results for input(s): TSH, T4TOTAL, FREET4, T3FREE, THYROIDAB in the last 72 hours. Anemia Panel: No results for input(s): VITAMINB12, FOLATE, FERRITIN, TIBC, IRON, RETICCTPCT in the last 72 hours. Sepsis Labs: No results for input(s): PROCALCITON, LATICACIDVEN in the last 168 hours.  Recent Results (from the past 240 hour(s))  Surgical pcr screen     Status: None   Collection Time: 04/11/16 11:04 PM  Result Value Ref Range Status   MRSA, PCR NEGATIVE NEGATIVE Final   Staphylococcus aureus NEGATIVE NEGATIVE Final    Comment:        The Xpert SA Assay (FDA approved for NASAL specimens in patients over 5 years of age), is one component of a comprehensive surveillance program.  Test performance  has been validated by [facility] for patients greater than or equal to 85 year old. It is not intended to diagnose infection nor to guide or monitor treatment.          Radiology Studies: [employer]/inject/imaging  Result Date: 04/17/2016 INDICATION: Severe thoracic and lumbosacral pain due to compression fractures. EXAM: IR VERTEBROPLASTY CERVICOTHORACIC INJ; IR VERTEBROPLASTY ADDL INJECTION MEDICATIONS: As antibiotic prophylaxis, 1500 mg of vancomycin IV was ordered pre-procedure and administered intravenously within 1 hour of incision. ANESTHESIA/SEDATION: Moderate (conscious) sedation was employed during this procedure. A total of Versed 2 mg and Fentanyl 50 mcg was administered intravenously. Moderate Sedation Time: 56 minutes. The patient's level of consciousness and vital signs were monitored continuously by radiology nursing throughout the procedure under my direct supervision. FLUOROSCOPY TIME:  Fluoroscopy Time: 24 minutes 16 seconds (4098 mGy) COMPLICATIONS: None immediate. TECHNIQUE: Informed written consent was obtained from the patient after a thorough discussion of the procedural risks, benefits and alternatives. All questions were addressed. Maximal Sterile Barrier Technique was utilized including caps, mask, sterile gowns, sterile gloves, sterile drape, hand hygiene and skin antiseptic. A timeout was performed prior to the initiation of the procedure. PROCEDURE: The patient was placed prone on the fluoroscopic table. Nasal oxygen was administered. Physiologic monitoring was performed throughout the duration of the procedure. The skin overlying the thoracolumbar region was prepped and draped in the usual sterile fashion. The T6, T7 and L5 vertebral bodies in the right pedicle at T6, the left pedicle at T7 and the right pedicle L5 were then identified and infiltrated with 0.25% bupivacaine. This was then followed by the advancement of a 13-gauge  Cook needle through the right pedicle at T6, the left pedicle at T7 and the right pedicle at L5 pedicle into the anterior one-third at these 3 levels. A gentle contrast injection demonstrated a trabecular pattern of contrast with a prominent vein opacifying at L5. This necessitated the use of Gel-Foam pledgets into the 13 gauge Cook spinal needle at all 3 levels prior to the delivery of the methylmethacrylate mixture. At this time, methylmethacrylate mixture was reconstituted in the Stryker delivery device system. Under biplane intermittent fluoroscopy, the methylmethacrylate was then injected into the T6, T7 and at the L5 levels with filling of the vertebral bodies. No extravasation was noted into the disk spaces or posteriorly into the spinal canal. No epidural venous contamination was seen. A tiny amount of the methylmethacrylate mixture was seen to extrude  into a paraspinous vein anteriorly at L5 where it remained stable throughout the procedure. The patient did not exhibit any hemodynamic or respiratory dysfunction. The needles were then removed. Hemostasis was achieved at the skin entry sites. There were no acute complications. Patient tolerated the procedure well. The patient was then sent back to her floor in good and stable condition. IMPRESSION: 1. Status post vertebral body augmentation for painful compression fracture at T6, T7 and L5 using vertebroplasty technique. Electronically Signed   By: Luanne Bras M.D.   On: 04/15/2016 18:23   Ir Vertebroplasty Ea Addl (t&ls) [employer] Inject/imaging  Result Date: 04/17/2016 INDICATION: Severe thoracic and lumbosacral pain due to compression fractures. EXAM: IR VERTEBROPLASTY CERVICOTHORACIC INJ; IR VERTEBROPLASTY ADDL INJECTION MEDICATIONS: As antibiotic prophylaxis, 1500 mg of vancomycin IV was ordered pre-procedure and administered intravenously within 1 hour of incision. ANESTHESIA/SEDATION: Moderate (conscious) sedation was employed  during this procedure. A total of Versed 2 mg and Fentanyl 50 mcg was administered intravenously. Moderate Sedation Time: 56 minutes. The patient's level of consciousness and vital signs were monitored continuously by radiology nursing throughout the procedure under my direct supervision. FLUOROSCOPY TIME:  Fluoroscopy Time: 24 minutes 16 seconds (4650 mGy) COMPLICATIONS: None immediate. TECHNIQUE: Informed written consent was obtained from the patient after a thorough discussion of the procedural risks, benefits and alternatives. All questions were addressed. Maximal Sterile Barrier Technique was utilized including caps, mask, sterile gowns, sterile gloves, sterile drape, hand hygiene and skin antiseptic. A timeout was performed prior to the initiation of the procedure. PROCEDURE: The patient was placed prone on the fluoroscopic table. Nasal oxygen was administered. Physiologic monitoring was performed throughout the duration of the procedure. The skin overlying the thoracolumbar region was prepped and draped in the usual sterile fashion. The T6, T7 and L5 vertebral bodies in the right pedicle at T6, the left pedicle at T7 and the right pedicle L5 were then identified and infiltrated with 0.25% bupivacaine. This was then followed by the advancement of a 13-gauge Cook needle through the right pedicle at T6, the left pedicle at T7 and the right pedicle at L5 pedicle into the anterior one-third at these 3 levels. A gentle contrast injection demonstrated a trabecular pattern of contrast with a prominent vein opacifying at L5. This necessitated the use of Gel-Foam pledgets into the 13 gauge Cook spinal needle at all 3 levels prior to the delivery of the methylmethacrylate mixture. At this time, methylmethacrylate mixture was reconstituted in the Stryker delivery device system. Under biplane intermittent fluoroscopy, the methylmethacrylate was then injected into the T6, T7 and at the L5 levels with filling of the  vertebral bodies. No extravasation was noted into the disk spaces or posteriorly into the spinal canal. No epidural venous contamination was seen. A tiny amount of the methylmethacrylate mixture was seen to extrude into a paraspinous vein anteriorly at L5 where it remained stable throughout the procedure. The patient did not exhibit any hemodynamic or respiratory dysfunction. The needles were then removed. Hemostasis was achieved at the skin entry sites. There were no acute complications. Patient tolerated the procedure well. The patient was then sent back to her floor in good and stable condition. IMPRESSION: 1. Status post vertebral body augmentation for painful compression fracture at T6, T7 and L5 using vertebroplasty technique. Electronically Signed   By: Luanne Bras M.D.   On: 04/15/2016 18:23   Ir Vertebroplasty Ea Addl (t&ls) [employer] Inject/imaging  Result Date: 04/17/2016 INDICATION: Severe thoracic  and lumbosacral pain due to compression fractures. EXAM: IR VERTEBROPLASTY CERVICOTHORACIC INJ; IR VERTEBROPLASTY ADDL INJECTION MEDICATIONS: As antibiotic prophylaxis, 1500 mg of vancomycin IV was ordered pre-procedure and administered intravenously within 1 hour of incision. ANESTHESIA/SEDATION: Moderate (conscious) sedation was employed during this procedure. A total of Versed 2 mg and Fentanyl 50 mcg was administered intravenously. Moderate Sedation Time: 56 minutes. The patient's level of consciousness and vital signs were monitored continuously by radiology nursing throughout the procedure under my direct supervision. FLUOROSCOPY TIME:  Fluoroscopy Time: 24 minutes 16 seconds (8676 mGy) COMPLICATIONS: None immediate. TECHNIQUE: Informed written consent was obtained from the patient after a thorough discussion of the procedural risks, benefits and alternatives. All questions were addressed. Maximal Sterile Barrier Technique was utilized including caps, mask, sterile gowns, sterile  gloves, sterile drape, hand hygiene and skin antiseptic. A timeout was performed prior to the initiation of the procedure. PROCEDURE: The patient was placed prone on the fluoroscopic table. Nasal oxygen was administered. Physiologic monitoring was performed throughout the duration of the procedure. The skin overlying the thoracolumbar region was prepped and draped in the usual sterile fashion. The T6, T7 and L5 vertebral bodies in the right pedicle at T6, the left pedicle at T7 and the right pedicle L5 were then identified and infiltrated with 0.25% bupivacaine. This was then followed by the advancement of a 13-gauge Cook needle through the right pedicle at T6, the left pedicle at T7 and the right pedicle at L5 pedicle into the anterior one-third at these 3 levels. A gentle contrast injection demonstrated a trabecular pattern of contrast with a prominent vein opacifying at L5. This necessitated the use of Gel-Foam pledgets into the 13 gauge Cook spinal needle at all 3 levels prior to the delivery of the methylmethacrylate mixture. At this time, methylmethacrylate mixture was reconstituted in the Stryker delivery device system. Under biplane intermittent fluoroscopy, the methylmethacrylate was then injected into the T6, T7 and at the L5 levels with filling of the vertebral bodies. No extravasation was noted into the disk spaces or posteriorly into the spinal canal. No epidural venous contamination was seen. A tiny amount of the methylmethacrylate mixture was seen to extrude into a paraspinous vein anteriorly at L5 where it remained stable throughout the procedure. The patient did not exhibit any hemodynamic or respiratory dysfunction. The needles were then removed. Hemostasis was achieved at the skin entry sites. There were no acute complications. Patient tolerated the procedure well. The patient was then sent back to her floor in good and stable condition. IMPRESSION: 1. Status post vertebral body augmentation for  painful compression fracture at T6, T7 and L5 using vertebroplasty technique. Electronically Signed   By: Luanne Bras M.D.   On: 04/15/2016 18:23        Scheduled Meds: . feeding supplement (ENSURE ENLIVE)  237 mL Oral BID BM  . furosemide  40 mg Oral BID  . liver oil-zinc oxide  1 application Topical TID  . metoprolol succinate  25 mg Oral Daily  . pantoprazole  40 mg Oral Daily  . polyethylene glycol  17 g Oral BID  . senna-docusate  1 tablet Oral BID  . sodium chloride flush  3 mL Intravenous Q12H  . warfarin  10 mg Oral ONCE-1800  . Warfarin - Pharmacist Dosing Inpatient   Does not apply q1800   Continuous Infusions: . heparin 1,100 Units/hr (04/17/16 0815)     LOS: 7 days    Time spent: 35 minutes.     Manika Hast,  Cassie Freer, MD [facility] Pager [phone]  If 7PM-7AM, please contact night-coverage [email or web address] Password [email or web address] 04/17/2016, 2:02 PM

## 2016-04-17 NOTE — Progress Notes (Signed)
Physical Therapy Treatment Patient Details Name: [name] MRN: [id number] DOB: 08-Apr-1935 Today's Date: 04/17/2016    History of Present Illness 81 yo female with history of Mitral valve replacement and CHF on coumadin, admitted with severe pain, post multiple falls over past 3 weeks at home.  Imaging shows spine compression fractures T4, T6, T7, L3, L4, L5.  S/p kyphoplasty (04/15/16).    PT Comments    Pt declined OOB due to just returning to bed. She did agree to bed exercises. Will continue to follow.   Follow Up Recommendations  SNF     Equipment Recommendations  None recommended by PT    Recommendations for Other Services       Precautions / Restrictions Precautions Precautions: Fall Restrictions Weight Bearing Restrictions: No    Mobility  Bed Mobility                  Transfers                    Ambulation/Gait                 Stairs            Wheelchair Mobility    Modified Rankin (Stroke Patients Only)       Balance                                    Cognition Arousal/Alertness: Awake/alert Behavior During Therapy: WFL for tasks assessed/performed Overall Cognitive Status: Within Functional Limits for tasks assessed                      Exercises General Exercises - Lower Extremity Ankle Circles/Pumps: AROM;Both;15 reps;Supine Quad Sets: AROM;Both;15 reps;Supine Heel Slides: AROM;Both;15 reps;Supine Hip ABduction/ADduction: AROM;Both;15 reps;Supine    General Comments        Pertinent Vitals/Pain Pain Assessment: Faces Faces Pain Scale: Hurts even more Pain Location: neck Pain Descriptors / Indicators: Aching;Sore Pain Intervention(s): Limited activity within patient's tolerance    Home Living                      Prior Function            PT Goals (current goals can now be found in the care plan section) Progress towards PT goals: Progressing toward  goals    Frequency    Min 3X/week      PT Plan Current plan remains appropriate    Co-evaluation             End of Session   Activity Tolerance: Patient tolerated treatment well Patient left: in bed;with call bell/phone within reach     Time: 1340-1348 PT Time Calculation (min) (ACUTE ONLY): 8 min  Charges:  $Gait Training: 8-22 mins                    G Codes:      Weston Anna, MPT Pager: [phone]

## 2016-04-17 NOTE — Progress Notes (Signed)
CSW met with pt's dtr who has accepted offer from [facility]. Sharyn Lull in admissions at [facility] notified. CSW will follow.   Wandra Feinstein, MSW, LCSW [phone] (coverage)

## 2016-04-18 ENCOUNTER — Encounter (HOSPITAL_COMMUNITY): Payer: Self-pay | Admitting: *Deleted

## 2016-04-18 DIAGNOSIS — M545 Low back pain, unspecified: Secondary | ICD-10-CM

## 2016-04-18 DIAGNOSIS — M4850XS Collapsed vertebra, not elsewhere classified, site unspecified, sequela of fracture: Secondary | ICD-10-CM

## 2016-04-18 LAB — CBC
HCT: 36.1 % (ref 36.0–46.0)
Hemoglobin: 11.5 g/dL — ABNORMAL LOW (ref 12.0–15.0)
MCH: 26.6 pg (ref 26.0–34.0)
MCHC: 31.9 g/dL (ref 30.0–36.0)
MCV: 83.4 fL (ref 78.0–100.0)
PLATELETS: 175 10*3/uL (ref 150–400)
RBC: 4.33 MIL/uL (ref 3.87–5.11)
RDW: 21.4 % — AB (ref 11.5–15.5)
WBC: 4 10*3/uL (ref 4.0–10.5)

## 2016-04-18 LAB — BASIC METABOLIC PANEL
Anion gap: 8 (ref 5–15)
BUN: 16 mg/dL (ref 6–20)
CALCIUM: 9.1 mg/dL (ref 8.9–10.3)
CO2: 26 mmol/L (ref 22–32)
CREATININE: 1.39 mg/dL — AB (ref 0.44–1.00)
Chloride: 105 mmol/L (ref 101–111)
GFR calc Af Amer: 41 mL/min — ABNORMAL LOW (ref >60)
GFR, EST NON AFRICAN AMERICAN: 35 mL/min — AB (ref >60)
GLUCOSE: 86 mg/dL (ref 65–99)
Potassium: 3.6 mmol/L (ref 3.5–5.1)
SODIUM: 139 mmol/L (ref 135–145)

## 2016-04-18 LAB — PROTIME-INR
INR: 1.9
PROTHROMBIN TIME: 22 s — AB (ref 11.4–15.2)

## 2016-04-18 LAB — HEPARIN LEVEL (UNFRACTIONATED): Heparin Unfractionated: 0.37 IU/mL (ref 0.30–0.70)

## 2016-04-18 MED ORDER — WARFARIN SODIUM 5 MG PO TABS
5.0000 mg | ORAL_TABLET | Freq: Once | ORAL | Status: AC
  Administered 2016-04-18: 5 mg via ORAL
  Filled 2016-04-18: qty 1

## 2016-04-18 NOTE — Progress Notes (Signed)
ANTICOAGULATION CONSULT NOTE - Follow Up Consult  Pharmacy Consult for Heparin and Coumadin Indication: atrial fibrillation and St. Jude MVR  Allergies  Allergen Reactions  . Ace Inhibitors     Other reaction(s): Cough  . Hydrocodone Itching and Nausea Only  . Mercury     Other reaction(s): Unknown  . Nickel   . Silver Dermatitis    Severe itching  . Cephalexin Rash  . Clindamycin/Lincomycin Rash  . Penicillins Rash    Has patient had a PCN reaction causing immediate rash, facial/tongue/throat swelling, SOB or lightheadedness with hypotension: YES Has patient had a PCN reaction causing severe rash involving mucus membranes or skin necrosis: NO Has patient had a PCN reaction that required hospitalization NO Has patient had a PCN reaction occurring within the last 10 years: NO If all of the above answers are "NO", then may proceed with Cephalosporin use.    Patient Measurements: Height: 5\' 6"  (167.6 cm) Weight: 156 lb 11.2 oz (71.1 kg) IBW/kg (Calculated) : 59.3 Heparin Dosing Weight:   Vital Signs: Temp: 98.1 F (36.7 C) (10/19 0500) Temp Source: Oral (10/19 0500) BP: 128/47 (10/19 0500) Pulse Rate: 55 (10/19 0500)  Labs:  Recent Labs  04/16/16 0215 04/17/16 0625 04/17/16 1637 04/18/16 0259  HGB 11.3* 12.3  --  11.5*  HCT 35.7* 39.0  --  36.1  PLT 179 185  --  175  LABPROT 15.5* 16.5*  --  22.0*  INR 1.22 1.32  --  1.90  HEPARINUNFRC 0.31 <0.10* 0.39 0.37  CREATININE 1.27* 1.32*  --  1.39*    Estimated Creatinine Clearance: 31.2 mL/min (by C-G formula based on SCr of 1.39 mg/dL (H)).   Medications:  Scheduled:  . feeding supplement (ENSURE ENLIVE)  237 mL Oral BID BM  . furosemide  40 mg Oral BID  . liver oil-zinc oxide  1 application Topical TID  . metoprolol succinate  25 mg Oral Daily  . pantoprazole  40 mg Oral Daily  . polyethylene glycol  17 g Oral BID  . senna-docusate  1 tablet Oral BID  . sodium chloride flush  3 mL Intravenous Q12H  .  Warfarin - Pharmacist Dosing Inpatient   Does not apply q1800    Assessment: 20 YOF on warfarin PTA for hx St. Jude MVR and Afib - held this admission for vertebroplasy/kyphoplasy and bridged with heparin. Warfarin was resumed post-op on 10/16 and the patient was to continue on a heparin bridge.    Heparin level therapeutic this AM and INR trending toward goal.  Hg stable and pltc wnl.  No bleeding noted.  Goal of Therapy:  Heparin level 0.3-0.7 units/ml INR 2.5-3.5 Monitor platelets by anticoagulation protocol: Yes   Plan:  Continue Heparin 1100 units/hr Coumadin 5mg  Daily Heparin level, CBC, INR Watch for s/s of bleeding   Gracy Bruins, [facility]

## 2016-04-18 NOTE — Progress Notes (Signed)
Nutrition Follow-up  DOCUMENTATION CODES:   Non-severe (moderate) malnutrition in context of chronic illness  INTERVENTION:  Continue Ensure Enlive po BID, each supplement provides 350 kcal and 20 grams of protein.  Encourage adequate PO intake.   NUTRITION DIAGNOSIS:   Malnutrition related to chronic illness as evidenced by moderate depletion of body fat, moderate depletions of muscle mass, percent weight loss; ongoing  GOAL:   Patient will meet greater than or equal to 90% of their needs; progressing  MONITOR:   PO intake, Labs, Weight trends, Supplement acceptance, Skin, I & O's  REASON FOR ASSESSMENT:   Malnutrition Screening Tool    ASSESSMENT:   81 y.o. female with medical history significant for mitral valve replacement on Coumadin, chronic diastolic CHF, GERD, and hypertension who presents to the emergency department for evaluation of severe pain in the mid and low back. revealed compression fractures of L3, L4, L5, T4, T6, and possibly T7. Other chronic vertebral compression fractures were also noted. Patient has been suffering with severe pain secondary to these injuries despite treatment at home with oxycodone. She has been evaluated by interventional radiology and there was plan for possible kyphoplasty.   Meal completion has been 50-75%. Pt reports having a decreased appetite however reports she has been consuming her Ensure shakes and would like to continue with the orders. Pt encouraged to eat her food at meals and to drink her supplements.   Labs and medications reviewed.   Diet Order:  Diet Heart Room service appropriate? Yes; Fluid consistency: Thin  Skin:  Wound (see comment) (stage II on buttocks, wond on leg, incision on back)  Last BM:  10/16  Height:   Ht Readings from Last 1 Encounters:  04/10/16 5\' 6"  (1.676 m)    Weight:   Wt Readings from Last 1 Encounters:  04/18/16 156 lb 11.2 oz (71.1 kg)    Ideal Body Weight:  59 kg  BMI:  Body  mass index is 25.29 kg/m.  Estimated Nutritional Needs:   Kcal:  1800-1950  Protein:  80-90 grams  Fluid:  1.8 - 1.9 L/day  EDUCATION NEEDS:   No education needs identified at this time  Corrin Parker, MS, RD, LDN Pager # [phone] After hours/ weekend pager # [phone]

## 2016-04-18 NOTE — Care Management Note (Signed)
Case Management Note  Patient Details  Name: [name] MRN: [id number] Date of Birth: [birth year]  Subjective/Objective:                    Action/Plan: Awaiting therapeutic INR. Plan is Materials engineer per CSW for rehab. CM following for further d/c needs.   Expected Discharge Date:                  Expected Discharge Plan:     In-House Referral:     Discharge planning Services     Post Acute Care Choice:    Choice offered to:     DME Arranged:    DME Agency:     HH Arranged:    HH Agency:     Status of Service:     If discussed at [employer], dates discussed:    Additional Comments:  Pollie Friar, RN 04/18/2016, 3:12 PM

## 2016-04-18 NOTE — Progress Notes (Signed)
Patient ID: [name], female   DOB: 07/26/1934, 81 y.o.   MRN: [id number]  PROGRESS NOTE    [name]  WUJ:[id number] DOB: 05-13-1935 DOA: 04/10/2016  PCP: Rusty Aus, MD   Brief Narrative:  81 y.o.femalewith medical history significant for mitral valve replacement on Coumadin, chronic diastolic CHF, GERD, and hypertension who presents to the emergency department for evaluation of severe pain in the mid and low back. Patient reported mechanical fall at home approximately 3 weeks prior to this admission and another fall 1 week prior to the admission.   CT and MRI of the lumbar and thoracic spine revealed compression fractures of L3, L4, L5, T4, T6, and possibly T7. Other chronic vertebral compression fractures were also noted. Patient underwent kyphoplasty and vertebroplasty 04/16/2016.  Assessment & Plan:   Multiple compression fractures with intractable pain  - Pt has suffered 2 mechanical falls at home in the last month and has been essentially bed-bound with severe pain despite treatment with oxycodone at home - S/P vertebroplasty and kyphoplasty by IR 04/16/2016 - Plan for discharge to skilled nursing facility once INR therapeutic  Hx of mitral valve replacement Mechanical - Stable, managed with warfarin  - INR subtherapeutic at 1.9 this am - Continue heparin drip and Coumadin  CKD stage III  - SCr 1.75 on admission, up from apparent baseline of ~1.4--1.6 - Holding spironolactone - Creatinine within baseline range at this time   Chronic systolic CHF  - TTE (03/05/61) with EF 40-45%, moderate TR - Managed with Lasix, Aldactone, Toprol at home. As noted aldactone on hold due to renal insufficiency   Right side chest pain - Dedicated rib x ray with questionable 9 th rib fracture. Incentive spirometry. Pain management  Elevated Alkaline phosphatase.  - [place] cholelithiasis.   Blister, bloody LE.  - Wound care consulted. Appreciate their assessment    Bilat buttocks with 2 stage 2 pressure injuries - Each is approx 1X1X.1cm, dry peeling skin surrounding pink dry wound bed.    DVT prophylaxis: on heparin drip and coumadin  Code Status: full code  Family Communication: daughter at the bedside this am Disposition Plan: to SNF once INR 2-3; likely tomorrow am   Consultants:   IR  Cardio   Procedures:   L5 and T6/T7 kyphoplasty / vertebroplasty 04/16/2016   Antimicrobials:   None    Subjective: No overnight events.   Objective: Vitals:   04/17/16 0505 04/17/16 1237 04/17/16 2032 04/18/16 0500  BP:  122/67 (!) 128/49 (!) 128/47  Pulse:  65 66 (!) 55  Resp:  16 16 16   Temp:  97.5 F (36.4 C) 98.1 F (36.7 C) 98.1 F (36.7 C)  TempSrc:  Oral Oral Oral  SpO2:  95% 92% 94%  Weight: 71.8 kg (158 lb 3.2 oz)   71.1 kg (156 lb 11.2 oz)  Height:        Intake/Output Summary (Last 24 hours) at 04/18/16 1036 Last data filed at 04/18/16 0900  Gross per 24 hour  Intake              480 ml  Output                0 ml  Net              480 ml   Filed Weights   04/16/16 0500 04/17/16 0505 04/18/16 0500  Weight: 72.6 kg (160 lb) 71.8 kg (158 lb 3.2 oz) 71.1 kg (156 lb 11.2 oz)  Examination:  General exam: Appears calm and comfortable  Respiratory system: Clear to auscultation. Respiratory effort normal. Cardiovascular system: S1 & S2 heard, Rate controlled  Gastrointestinal system: Abdomen is nondistended, soft and nontender. No organomegaly or masses felt. Normal bowel sounds heard. Central nervous system: Alert and oriented. No focal neurological deficits. Extremities: Symmetric 5 x 5 power. Skin: No rashes, lesions or ulcers Psychiatry: Judgement and insight appear normal. Mood & affect appropriate.   Data Reviewed: I have personally reviewed following labs and imaging studies  CBC:  Recent Labs Lab 04/14/16 0254 04/15/16 0320 04/16/16 0215 04/17/16 0625 04/18/16 0259  WBC 4.6 3.5* 5.0 5.2 4.0  HGB  11.5* 11.7* 11.3* 12.3 11.5*  HCT 37.3 37.9 35.7* 39.0 36.1  MCV 83.8 83.8 83.8 83.5 83.4  PLT 203 194 179 185 967   Basic Metabolic Panel:  Recent Labs Lab 04/14/16 1225 04/15/16 0320 04/16/16 0215 04/17/16 0625 04/18/16 0259  NA 136 138 137 137 139  K 3.7 4.1 3.8 3.8 3.6  CL 98* 102 103 103 105  CO2 27 28 27 24 26   GLUCOSE 95 96 92 94 86  BUN 20 19 14 15 16   CREATININE 1.49* 1.40* 1.27* 1.32* 1.39*  CALCIUM 9.7 9.5 9.3 9.5 9.1   GFR: Estimated Creatinine Clearance: 31.2 mL/min (by C-G formula based on SCr of 1.39 mg/dL (H)). Liver Function Tests:  Recent Labs Lab 04/12/16 1130 04/13/16 0429  AST 18 16  ALT 18 20  ALKPHOS 171* 187*  BILITOT 0.9 1.0  PROT 5.6* 5.8*  ALBUMIN 3.1* 3.2*   No results for input(s): LIPASE, AMYLASE in the last 168 hours. No results for input(s): AMMONIA in the last 168 hours. Coagulation Profile:  Recent Labs Lab 04/14/16 0254 04/15/16 0320 04/16/16 0215 04/17/16 0625 04/18/16 0259  INR 1.38 1.24 1.22 1.32 1.90   Cardiac Enzymes: No results for input(s): CKTOTAL, CKMB, CKMBINDEX, TROPONINI in the last 168 hours. BNP (last 3 results) No results for input(s): PROBNP in the last 8760 hours. HbA1C: No results for input(s): HGBA1C in the last 72 hours. CBG: No results for input(s): GLUCAP in the last 168 hours. Lipid Profile: No results for input(s): CHOL, HDL, LDLCALC, TRIG, CHOLHDL, LDLDIRECT in the last 72 hours. Thyroid Function Tests: No results for input(s): TSH, T4TOTAL, FREET4, T3FREE, THYROIDAB in the last 72 hours. Anemia Panel: No results for input(s): VITAMINB12, FOLATE, FERRITIN, TIBC, IRON, RETICCTPCT in the last 72 hours. Urine analysis:    Component Value Date/Time   COLORURINE YELLOW (A) 04/28/2015 1421   APPEARANCEUR CLEAR (A) 04/28/2015 1421   APPEARANCEUR Hazy 03/22/2012 1151   LABSPEC 1.009 04/28/2015 1421   LABSPEC 1.043 03/22/2012 1151   PHURINE 6.0 04/28/2015 1421   GLUCOSEU NEGATIVE 04/28/2015  1421   GLUCOSEU Negative 03/22/2012 1151   HGBUR NEGATIVE 04/28/2015 1421   BILIRUBINUR NEGATIVE 04/28/2015 1421   BILIRUBINUR Negative 03/22/2012 1151   KETONESUR NEGATIVE 04/28/2015 1421   PROTEINUR NEGATIVE 04/28/2015 1421   NITRITE NEGATIVE 04/28/2015 1421   LEUKOCYTESUR NEGATIVE 04/28/2015 1421   LEUKOCYTESUR Negative 03/22/2012 1151   Sepsis Labs: @LABRCNTIP (procalcitonin:4,lacticidven:4)   Recent Results (from the past 240 hour(s))  Surgical pcr screen     Status: None   Collection Time: 04/11/16 11:04 PM  Result Value Ref Range Status   MRSA, PCR NEGATIVE NEGATIVE Final   Staphylococcus aureus NEGATIVE NEGATIVE Final      Radiology Studies: Ir Vertebroplasty Cerv/thor Bx Inc Uni/bil Inc/inject/imaging Result Date: 04/17/2016 1. Status post vertebral body augmentation for  painful compression fracture at T6, T7 and L5 using vertebroplasty technique. Electronically Signed   By: Luanne Bras M.D.   On: 04/15/2016 18:23   Ir Vertebroplasty Ea Addl (t&ls) Bx Inc Uni/bil Inc Inject/imaging Result Date: 04/17/2016 1. Status post vertebral body augmentation for painful compression fracture at T6, T7 and L5 using vertebroplasty technique. Electronically Signed   By: Luanne Bras M.D.   On: 04/15/2016 18:23   Ir Vertebroplasty Ea Addl (t&ls) Bx Inc Uni/bil Inc Inject/imaging Result Date: 04/17/2016 1. Status post vertebral body augmentation for painful compression fracture at T6, T7 and L5 using vertebroplasty technique. Electronically Signed   By: Luanne Bras M.D.   On: 04/15/2016 18:23     Scheduled Meds: . feeding supplement (ENSURE ENLIVE)  237 mL Oral BID BM  . furosemide  40 mg Oral BID  . liver oil-zinc oxide  1 application Topical TID  . metoprolol succinate  25 mg Oral Daily  . pantoprazole  40 mg Oral Daily  . polyethylene glycol  17 g Oral BID  . senna-docusate  1 tablet Oral BID  . sodium chloride flush  3 mL Intravenous Q12H  . Warfarin -  Pharmacist Dosing Inpatient   Does not apply q1800   Continuous Infusions: . heparin 1,100 Units/hr (04/18/16 0744)     LOS: 8 days    Time spent: 15 minutes  Greater than 50% of the time spent on counseling and coordinating the care.   Leisa Lenz, MD [facility] Pager [phone]  If 7PM-7AM, please contact night-coverage [email or web address] Password [email or web address] 04/18/2016, 10:36 AM

## 2016-04-19 ENCOUNTER — Encounter
Admission: RE | Admit: 2016-04-19 | Discharge: 2016-04-19 | Disposition: A | Discharge status: Home / Self Care | Payer: Medicare Other | Source: Ambulatory Visit | Attending: Internal Medicine | Admitting: Internal Medicine

## 2016-04-19 DIAGNOSIS — N183 Chronic kidney disease, stage 3 (moderate): Secondary | ICD-10-CM

## 2016-04-19 LAB — PROTIME-INR
INR: 3.26
PROTHROMBIN TIME: 33.9 s — AB (ref 11.4–15.2)

## 2016-04-19 LAB — CBC
HEMATOCRIT: 37.9 % (ref 36.0–46.0)
Hemoglobin: 11.7 g/dL — ABNORMAL LOW (ref 12.0–15.0)
MCH: 26.1 pg (ref 26.0–34.0)
MCHC: 30.9 g/dL (ref 30.0–36.0)
MCV: 84.4 fL (ref 78.0–100.0)
PLATELETS: 192 10*3/uL (ref 150–400)
RBC: 4.49 MIL/uL (ref 3.87–5.11)
RDW: 21.7 % — AB (ref 11.5–15.5)
WBC: 4.1 10*3/uL (ref 4.0–10.5)

## 2016-04-19 LAB — HEPARIN LEVEL (UNFRACTIONATED): Heparin Unfractionated: 0.54 IU/mL (ref 0.30–0.70)

## 2016-04-19 MED ORDER — OXYCODONE HCL 5 MG PO TABS: 5.0000 mg | ORAL_TABLET | ORAL | 0 refills | Status: DC

## 2016-04-19 MED ORDER — ENSURE ENLIVE PO LIQD: 237.0000 mL | Freq: Two times a day (BID) | ORAL | 12 refills | Status: DC

## 2016-04-19 MED ORDER — POLYETHYLENE GLYCOL 3350 17 G PO PACK: 17.0000 g | PACK | Freq: Two times a day (BID) | ORAL | 0 refills | Status: DC

## 2016-04-19 MED ORDER — SENNOSIDES-DOCUSATE SODIUM 8.6-50 MG PO TABS: 1.0000 | ORAL_TABLET | Freq: Two times a day (BID) | ORAL | 1 refills | Status: DC

## 2016-04-19 NOTE — Care Management Important Message (Signed)
Important Message  Patient Details  Name: [name] MRN: [id number] Date of Birth: 04-12-1935   Medicare Important Message Given:  Yes    Odus Clasby 04/19/2016, 4:11 PM

## 2016-04-19 NOTE — Progress Notes (Signed)
Report given to [place] at [facility]. All her questions answered. Transportation will be called by Education officer, museum and patient will be transported.

## 2016-04-19 NOTE — Progress Notes (Signed)
ANTICOAGULATION CONSULT NOTE - Follow Up Consult   Pharmacy Consult for Coumadin Indication: St.Jude MVR & AFib  Allergies  Allergen Reactions  . Ace Inhibitors     Other reaction(s): Cough  . Hydrocodone Itching and Nausea Only  . Mercury     Other reaction(s): Unknown  . Nickel   . Silver Dermatitis    Severe itching  . Cephalexin Rash  . Clindamycin/Lincomycin Rash  . Penicillins Rash    Has patient had a PCN reaction causing immediate rash, facial/tongue/throat swelling, SOB or lightheadedness with hypotension: YES Has patient had a PCN reaction causing severe rash involving mucus membranes or skin necrosis: NO Has patient had a PCN reaction that required hospitalization NO Has patient had a PCN reaction occurring within the last 10 years: NO If all of the above answers are "NO", then may proceed with Cephalosporin use.    Patient Measurements: Height: 5\' 6"  (167.6 cm) Weight: 156 lb 11.2 oz (71.1 kg) IBW/kg (Calculated) : 59.3 Heparin Dosing Weight:   Vital Signs: Temp: 98.9 F (37.2 C) (10/20 0614) Temp Source: Oral (10/20 0614) BP: 132/56 (10/20 0614) Pulse Rate: 58 (10/20 0614)  Labs:  Recent Labs  04/17/16 0625 04/17/16 1637 04/18/16 0259 04/19/16 0623  HGB 12.3  --  11.5* 11.7*  HCT 39.0  --  36.1 37.9  PLT 185  --  175 192  LABPROT 16.5*  --  22.0* 33.9*  INR 1.32  --  1.90 3.26  HEPARINUNFRC <0.10* 0.39 0.37 0.54  CREATININE 1.32*  --  1.39*  --     Estimated Creatinine Clearance: 31.2 mL/min (by C-G formula based on SCr of 1.39 mg/dL (H)).   Medications:  Scheduled:  . feeding supplement (ENSURE ENLIVE)  237 mL Oral BID BM  . furosemide  40 mg Oral BID  . liver oil-zinc oxide  1 application Topical TID  . metoprolol succinate  25 mg Oral Daily  . pantoprazole  40 mg Oral Daily  . polyethylene glycol  17 g Oral BID  . senna-docusate  1 tablet Oral BID  . sodium chloride flush  3 mL Intravenous Q12H  . Warfarin - Pharmacist Dosing  Inpatient   Does not apply q1800    Assessment: 22 YOF on warfarin PTA for hx St. Jude MVR and Afib - held this admission for vertebroplasy/kyphoplasy and bridged with heparin. Warfarin was resumed post-op on 10/16.  Pt is now off heparin and is to be d/cd to SNF on Lovenox/Coumadin  Lg jump in INR today to 3.26, expect will increase again.  Communicated with DrAbrol that Lovenox will not be needed.    Goal of Therapy:  INR 2.5-3.5 Monitor platelets by anticoagulation protocol: Yes   Plan:  No Coumadin today SPoke with pt to explain no Coumadin this evening Watch for s/s of bleeding   Gracy Bruins, PharmD [facility]

## 2016-04-19 NOTE — Clinical Social Work Placement (Signed)
   CLINICAL SOCIAL WORK PLACEMENT  NOTE  Date:  04/19/2016  Patient Details  Name: [name] MRN: [id number] Date of Birth: Feb 24, 1935  Clinical Social Work is seeking post-discharge placement for this patient at the [place] level of care (*CSW will initial, date and re-position this form in  chart as items are completed):  Yes   Patient/family provided with [facility] Work Department's list of facilities offering this level of care within the geographic area requested by the patient (or if unable, by the patient's family).  Yes   Patient/family informed of their freedom to choose among providers that offer the needed level of care, that participate in Medicare, Medicaid or managed care program needed by the patient, have an available bed and are willing to accept the patient.  Yes   Patient/family informed of [facility]'s ownership interest in [facility] and [facility], as well as of the fact that they are under no obligation to receive care at these facilities.  PASRR submitted to EDS on 04/14/16     PASRR number received on 04/14/16     Existing PASRR number confirmed on       FL2 transmitted to all facilities in geographic area requested by pt/family on 04/14/16     FL2 transmitted to all facilities within larger geographic area on       Patient informed that his/her managed care company has contracts with or will negotiate with certain facilities, including the following:        Yes (Of note, this CSW completed the upper portion of this placement note.  This CSW was in charge of discharge and noticed the placement note was not completed.  )   Patient/family informed of bed offers received.  Patient chooses bed at       Physician recommends and patient chooses bed at      Patient to be transferred to [facility] on 04/19/16.  Patient to be transferred to facility by [facility]     Patient family notified on 04/19/16 of  transfer.  Name of family member notified:  patient daughter and patient at bedside     PHYSICIAN Please prepare priority discharge summary, including medications     Additional Comment:    _______________________________________________ Dulcy Fanny, LCSW 04/19/2016, 12:27 PM

## 2016-04-19 NOTE — Clinical Social Work Note (Signed)
Patient will discharge today per MD order. Patient will discharge to [facility] RN to call report prior to transportation to: [place] [address] Transportation: [facility]  CSW sent discharge summary to SNF for review.    Nonnie Done, MSW, LCSW  [phone]  Licensed Clinical Social Worker

## 2016-04-19 NOTE — Discharge Summary (Addendum)
Physician Discharge Summary  GERALDY AKRIDGE MRN: [id number] DOB/AGE: [id number] 79 y.o.  PCP: Rusty Aus, MD   Admit date: 04/10/2016 Discharge date: 04/19/2016  Discharge Diagnoses:    Principal Problem:   Vertebral compression fracture [facility]) Active Problems:   Chronic combined systolic and diastolic CHF    Long term current use of anticoagulant therapy   H/O mitral valve replacement with mechanical valve   Lymphedema   CKD (chronic kidney disease), stage III   Intractable pain   Spinal compression fracture (HCC)   Malnutrition of moderate degree   Pressure injury of skin   Acute bilateral low back pain    Follow-up recommendations Follow-up with PCP in 3-5 days , including all  additional recommended appointments as below Follow-up CBC, CMP in 3-5 days Check daily INR, goal INR 2.5-3.5 , resume coumadin 10/21 Patient to follow up with Dr Estanislado Pandy TO DISCUSS FUTURE TREATMENTS / [place] kyphoplasty/vertebroplasty     Current Discharge Medication List    START taking these medications   Details  feeding supplement, ENSURE ENLIVE, (ENSURE ENLIVE) LIQD Take 237 mLs by mouth 2 (two) times daily between meals. Qty: 237 mL, Refills: 12    polyethylene glycol (MIRALAX / GLYCOLAX) packet Take 17 g by mouth 2 (two) times daily. Qty: 14 each, Refills: 0    senna-docusate (SENOKOT-S) 8.6-50 MG tablet Take 1 tablet by mouth 2 (two) times daily. Qty: 30 tablet, Refills: 1      CONTINUE these medications which have NOT CHANGED   Details  acetaminophen (TYLENOL) 500 MG tablet Take 500 mg by mouth every 6 (six) hours as needed for mild pain, moderate pain, fever or headache.    aspirin EC 81 MG tablet Take 81 mg by mouth daily.     furosemide (LASIX) 40 MG tablet Take 40 mg by mouth 2 (two) times daily.     liver oil-zinc oxide (DESITIN) 40 % ointment Apply 1 application topically 3 (three) times daily. Apply to bed sore    metoprolol succinate (TOPROL-XL)  25 MG 24 hr tablet Take 1 tablet (25 mg total) by mouth daily. Qty: 30 tablet, Refills: 2    oxyCODONE (OXY IR/ROXICODONE) 5 MG immediate release tablet Take 5 mg by mouth every 4 (four) hours.     pantoprazole (PROTONIX) 40 MG tablet Take 40 mg by mouth daily.     spironolactone (ALDACTONE) 25 MG tablet Take 25 mg by mouth 2 (two) times daily.     warfarin (COUMADIN) 5 MG tablet Take 1 tablet (5 mg total) by mouth daily. Qty: 30 tablet, Refills: 2               Discharge Condition: *Stable  Discharge Instructions Get Medicines reviewed and adjusted: Please take all your medications with you for your next visit with your Primary MD  Please request your Primary MD to go over all hospital tests and procedure/radiological results at the follow up, please ask your Primary MD to get all Hospital records sent to his/her office.  If you experience worsening of your admission symptoms, develop shortness of breath, life threatening emergency, suicidal or homicidal thoughts you must seek medical attention immediately by calling 911 or calling your MD immediately if symptoms less severe.  You must read complete instructions/literature along with all the possible adverse reactions/side effects for all the Medicines you take and that have been prescribed to you. Take any new Medicines after you have completely understood and accpet all the possible adverse reactions/side  effects.   Do not drive when taking Pain medications.   Do not take more than prescribed Pain, Sleep and Anxiety Medications  Special Instructions: If you have smoked or chewed Tobacco in the last 2 yrs please stop smoking, stop any regular Alcohol and or any Recreational drug use.  Wear Seat belts while driving.  Please note  You were cared for by a hospitalist during your hospital stay. Once you are discharged, your primary care physician will handle any further medical issues. Please note that NO REFILLS for any  discharge medications will be authorized once you are discharged, as it is imperative that you return to your primary care physician (or establish a relationship with a primary care physician if you do not have one) for your aftercare needs so that they can reassess your need for medications and monitor your lab values.     Allergies  Allergen Reactions  . Ace Inhibitors     Other reaction(s): Cough  . Hydrocodone Itching and Nausea Only  . Mercury     Other reaction(s): Unknown  . Nickel   . Silver Dermatitis    Severe itching  . Cephalexin Rash  . Clindamycin/Lincomycin Rash  . Penicillins Rash    Has patient had a PCN reaction causing immediate rash, facial/tongue/throat swelling, SOB or lightheadedness with hypotension: YES Has patient had a PCN reaction causing severe rash involving mucus membranes or skin necrosis: NO Has patient had a PCN reaction that required hospitalization NO Has patient had a PCN reaction occurring within the last 10 years: NO If all of the above answers are "NO", then may proceed with Cephalosporin use.      Disposition: 01-Home or Self Care   Consults:  None *    Significant Diagnostic Studies:  Dg Ribs Unilateral Right  Result Date: 04/12/2016 CLINICAL DATA:  81 year old female status post fall several weeks ago. Right side rib pain. T4, T6, and T7 vertebral body fractures noted by MRI in September. Subsequent encounter. EXAM: RIGHT RIBS - 2 VIEW COMPARISON:  Chest radiographs 10/10/2015 and earlier. Thoracic spine MRI 03/21/2016 FINDINGS: Right rib marker placed at the right lateral ninth or tenth rib level near the costochondral junctions. No displaced right rib fracture identified. Questionable nondisplaced right posterior lateral ninth rib fracture (arrow) Osteopenia. No right pneumothorax or pleural effusion. Subsegmental right lung base atelectasis. Stable visible cardiomegaly and mediastinal contours status post prior sternotomy and  cardiac valve replacement. IMPRESSION: 1. Osteopenia. Questionable nondisplaced right posterior lateral ninth rib fracture. No displaced right rib fracture identified. 2. Subsegmental atelectasis at the right lung base. Electronically Signed   By: Genevie Ann M.D.   On: 04/12/2016 16:07   Dg Abdomen 1 View  Result Date: 04/10/2016 CLINICAL DATA:  Back and right-sided abdominal pain.  Constipation. EXAM: ABDOMEN - 1 VIEW COMPARISON:  CT 03/22/2012 FINDINGS: Nonobstructive bowel gas pattern. Moderate amount of stool throughout the lower abdomen and pelvis. Again noted is a calcified uterine fibroid. Limited evaluation for free air. Again noted is a compression fracture at L4. IMPRESSION: Normal bowel gas pattern.  Moderate stool burden. Electronically Signed   By: Markus Daft M.D.   On: 04/10/2016 18:42   Mr Thoracic Spine Wo Contrast  Result Date: 03/21/2016 CLINICAL DATA:  Pain from the neck to the waist and bilateral hip pain for 2 months since a fall. EXAM: MRI THORACIC SPINE WITHOUT CONTRAST TECHNIQUE: Multiplanar, multisequence MR imaging of the thoracic spine was performed. No intravenous contrast was administered. COMPARISON:  CT thoracic spine 03/18/2016. FINDINGS: Alignment: No listhesis. Mild exaggeration of thoracic kyphosis centered about T4 is noted. Vertebrae: Severe T4 and T6 compression fractures are identified with associated marrow edema, more notable in T6. Vertebral body height loss of T4-T6 is estimated at up to 80-90% There is also marrow edema in the superior endplate of T7 without compression compatible with microfracturing. Remote compression fractures of T5, T9 and T10 are identified. Cord:  Normal signal throughout. Paraspinal and other soft tissues: Unremarkable. Disc levels: A few small disc protrusions are identified without central canal stenosis. Mild bony retropulsion off the mid aspect of T4 and T6 is also seen but does not cause central canal or foraminal stenosis. IMPRESSION:  Acute or subacute compression fractures of T4 and T6 with marked vertebral body height loss at both levels. Mild bony retropulsion is identified at both levels without causing central canal or foraminal stenosis. Marrow edema in the superior endplate of T7 without vertebral body height loss is compatible with microfracture. Remote compression fractures at T5, T9 and T10. Electronically Signed   By: Inge Rise M.D.   On: 03/21/2016 10:42   Mr Lumbar Spine Wo Contrast  Result Date: 03/21/2016 CLINICAL DATA:  Back pain from the neck to the waist with bilateral hip pain. History of fall 2 months ago. EXAM: MRI LUMBAR SPINE WITHOUT CONTRAST TECHNIQUE: Multiplanar, multisequence MR imaging of the lumbar spine was performed. No intravenous contrast was administered. COMPARISON:  CT lumbar spine 03/18/2016. FINDINGS: Segmentation:  Unremarkable. Alignment: Facet degenerative disease results in 0.5 cm anterolisthesis L3 on L4. Vertebrae: Biconcave compression fracture of L3 is identified with marrow edema in the inferior endplate. Vertebral body height loss of up to 30% centrally is identified. There is also a biconcave compression fracture of L4 with marrow edema in the inferior endplate. Vertebral body height loss is estimated at 60%. Marrow edema in the superior endplate of L5 is seen where there is a mild compression fracture with vertebral body height loss of approximately 20%. Remote L3 compression fracture is identified. There is marrow edema in the right sacrum consistent with insufficiency fracture. Conus medullaris: Extends to the T12-L1 level and appears normal. Paraspinal and other soft tissues: Multiple gallstones are identified. Largest stone measures 3 cm in diameter. Disc levels: T12-L1:  Negative. L1-2: Minimal disc bulge without central canal or foraminal stenosis. L2-3: Ligamentum flavum thickening and a shallow disc bulge are identified. The central canal and foramina remain open. L3-4: The disc  is uncovered with a shallow bulge. There is facet arthropathy and some ligamentum flavum thickening. The central spinal canal and foramina remain open. L4-5: Minimal disc bulge is seen and there is facet arthropathy. Mild subarticular recess narrowing on the left is identified without nerve root compression. The foramina are open. Marked marrow edema is present in the left L5 pedicle where there is likely a nondisplaced fracture. Mild marrow edema in the right L4 and L5 pedicles is compatible with stress change without fracture. L5-S1: Partial autologous fusion across the disc interspace is identified. No bulge or protrusion. The central canal and foramina are open. IMPRESSION: Acute or subacute compression fractures of the inferior endplates of L3 and L4 and superior endplate of L5. Intense marrow edema in the left L5 pedicle is present where there is likely nondisplaced fracture present. Acute or subacute right sacral fracture is also identified. Remote L3 compression fracture. Overall mild degenerative disease without central canal or foraminal stenosis. Mild subarticular recess narrowing on the left at L4-5  without nerve root compression is noted. Gallstones. Electronically Signed   By: Inge Rise M.D.   On: 03/21/2016 10:33   US Venous Img Lower Unilateral Left  Result Date: 03/21/2016 CLINICAL DATA:  Left leg swelling for 6 day EXAM: LEFT LOWER EXTREMITY VENOUS DUPLEX ULTRASOUND TECHNIQUE: Doppler venous assessment of the left lower extremity deep venous system was performed, including characterization of spectral flow, compressibility, and phasicity. COMPARISON:  None. FINDINGS: There is complete compressibility of the left common femoral, femoral, and popliteal veins. Doppler analysis demonstrates respiratory phasicity and augmentation of flow with calf compression. No obvious superficial vein or calf vein thrombosis. IMPRESSION: No evidence of left lower extremity DVT. Electronically Signed   By:  Marybelle Killings M.D.   On: 03/21/2016 16:24   Ir Vertebroplasty Cerv/thor Bx Inc Uni/bil Inc/inject/imaging  Result Date: 04/17/2016 INDICATION: Severe thoracic and lumbosacral pain due to compression fractures. EXAM: IR VERTEBROPLASTY CERVICOTHORACIC INJ; IR VERTEBROPLASTY ADDL INJECTION MEDICATIONS: As antibiotic prophylaxis, 1500 mg of vancomycin IV was ordered pre-procedure and administered intravenously within 1 hour of incision. ANESTHESIA/SEDATION: Moderate (conscious) sedation was employed during this procedure. A total of Versed 2 mg and Fentanyl 50 mcg was administered intravenously. Moderate Sedation Time: 56 minutes. The patient's level of consciousness and vital signs were monitored continuously by radiology nursing throughout the procedure under my direct supervision. FLUOROSCOPY TIME:  Fluoroscopy Time: 24 minutes 16 seconds (3235 mGy) COMPLICATIONS: None immediate. TECHNIQUE: Informed written consent was obtained from the patient after a thorough discussion of the procedural risks, benefits and alternatives. All questions were addressed. Maximal Sterile Barrier Technique was utilized including caps, mask, sterile gowns, sterile gloves, sterile drape, hand hygiene and skin antiseptic. A timeout was performed prior to the initiation of the procedure. PROCEDURE: The patient was placed prone on the fluoroscopic table. Nasal oxygen was administered. Physiologic monitoring was performed throughout the duration of the procedure. The skin overlying the thoracolumbar region was prepped and draped in the usual sterile fashion. The T6, T7 and L5 vertebral bodies in the right pedicle at T6, the left pedicle at T7 and the right pedicle L5 were then identified and infiltrated with 0.25% bupivacaine. This was then followed by the advancement of a 13-gauge Cook needle through the right pedicle at T6, the left pedicle at T7 and the right pedicle at L5 pedicle into the anterior one-third at these 3 levels. A gentle  contrast injection demonstrated a trabecular pattern of contrast with a prominent vein opacifying at L5. This necessitated the use of Gel-Foam pledgets into the 13 gauge Cook spinal needle at all 3 levels prior to the delivery of the methylmethacrylate mixture. At this time, methylmethacrylate mixture was reconstituted in the Stryker delivery device system. Under biplane intermittent fluoroscopy, the methylmethacrylate was then injected into the T6, T7 and at the L5 levels with filling of the vertebral bodies. No extravasation was noted into the disk spaces or posteriorly into the spinal canal. No epidural venous contamination was seen. A tiny amount of the methylmethacrylate mixture was seen to extrude into a paraspinous vein anteriorly at L5 where it remained stable throughout the procedure. The patient did not exhibit any hemodynamic or respiratory dysfunction. The needles were then removed. Hemostasis was achieved at the skin entry sites. There were no acute complications. Patient tolerated the procedure well. The patient was then sent back to her floor in good and stable condition. IMPRESSION: 1. Status post vertebral body augmentation for painful compression fracture at T6, T7 and L5 using vertebroplasty technique.  Electronically Signed   By: Luanne Bras M.D.   On: 04/15/2016 18:23   Ir Vertebroplasty Ea Addl (t&ls) Bx Inc Uni/bil Inc Inject/imaging  Result Date: 04/17/2016 INDICATION: Severe thoracic and lumbosacral pain due to compression fractures. EXAM: IR VERTEBROPLASTY CERVICOTHORACIC INJ; IR VERTEBROPLASTY ADDL INJECTION MEDICATIONS: As antibiotic prophylaxis, 1500 mg of vancomycin IV was ordered pre-procedure and administered intravenously within 1 hour of incision. ANESTHESIA/SEDATION: Moderate (conscious) sedation was employed during this procedure. A total of Versed 2 mg and Fentanyl 50 mcg was administered intravenously. Moderate Sedation Time: 56 minutes. The patient's level of  consciousness and vital signs were monitored continuously by radiology nursing throughout the procedure under my direct supervision. FLUOROSCOPY TIME:  Fluoroscopy Time: 24 minutes 16 seconds (8502 mGy) COMPLICATIONS: None immediate. TECHNIQUE: Informed written consent was obtained from the patient after a thorough discussion of the procedural risks, benefits and alternatives. All questions were addressed. Maximal Sterile Barrier Technique was utilized including caps, mask, sterile gowns, sterile gloves, sterile drape, hand hygiene and skin antiseptic. A timeout was performed prior to the initiation of the procedure. PROCEDURE: The patient was placed prone on the fluoroscopic table. Nasal oxygen was administered. Physiologic monitoring was performed throughout the duration of the procedure. The skin overlying the thoracolumbar region was prepped and draped in the usual sterile fashion. The T6, T7 and L5 vertebral bodies in the right pedicle at T6, the left pedicle at T7 and the right pedicle L5 were then identified and infiltrated with 0.25% bupivacaine. This was then followed by the advancement of a 13-gauge Cook needle through the right pedicle at T6, the left pedicle at T7 and the right pedicle at L5 pedicle into the anterior one-third at these 3 levels. A gentle contrast injection demonstrated a trabecular pattern of contrast with a prominent vein opacifying at L5. This necessitated the use of Gel-Foam pledgets into the 13 gauge Cook spinal needle at all 3 levels prior to the delivery of the methylmethacrylate mixture. At this time, methylmethacrylate mixture was reconstituted in the Stryker delivery device system. Under biplane intermittent fluoroscopy, the methylmethacrylate was then injected into the T6, T7 and at the L5 levels with filling of the vertebral bodies. No extravasation was noted into the disk spaces or posteriorly into the spinal canal. No epidural venous contamination was seen. A tiny amount of  the methylmethacrylate mixture was seen to extrude into a paraspinous vein anteriorly at L5 where it remained stable throughout the procedure. The patient did not exhibit any hemodynamic or respiratory dysfunction. The needles were then removed. Hemostasis was achieved at the skin entry sites. There were no acute complications. Patient tolerated the procedure well. The patient was then sent back to her floor in good and stable condition. IMPRESSION: 1. Status post vertebral body augmentation for painful compression fracture at T6, T7 and L5 using vertebroplasty technique. Electronically Signed   By: Luanne Bras M.D.   On: 04/15/2016 18:23   Ir Vertebroplasty Ea Addl (t&ls) Bx Inc Uni/bil Inc Inject/imaging  Result Date: 04/17/2016 INDICATION: Severe thoracic and lumbosacral pain due to compression fractures. EXAM: IR VERTEBROPLASTY CERVICOTHORACIC INJ; IR VERTEBROPLASTY ADDL INJECTION MEDICATIONS: As antibiotic prophylaxis, 1500 mg of vancomycin IV was ordered pre-procedure and administered intravenously within 1 hour of incision. ANESTHESIA/SEDATION: Moderate (conscious) sedation was employed during this procedure. A total of Versed 2 mg and Fentanyl 50 mcg was administered intravenously. Moderate Sedation Time: 56 minutes. The patient's level of consciousness and vital signs were monitored continuously by radiology nursing throughout the procedure  under my direct supervision. FLUOROSCOPY TIME:  Fluoroscopy Time: 24 minutes 16 seconds (8616 mGy) COMPLICATIONS: None immediate. TECHNIQUE: Informed written consent was obtained from the patient after a thorough discussion of the procedural risks, benefits and alternatives. All questions were addressed. Maximal Sterile Barrier Technique was utilized including caps, mask, sterile gowns, sterile gloves, sterile drape, hand hygiene and skin antiseptic. A timeout was performed prior to the initiation of the procedure. PROCEDURE: The patient was placed prone on  the fluoroscopic table. Nasal oxygen was administered. Physiologic monitoring was performed throughout the duration of the procedure. The skin overlying the thoracolumbar region was prepped and draped in the usual sterile fashion. The T6, T7 and L5 vertebral bodies in the right pedicle at T6, the left pedicle at T7 and the right pedicle L5 were then identified and infiltrated with 0.25% bupivacaine. This was then followed by the advancement of a 13-gauge Cook needle through the right pedicle at T6, the left pedicle at T7 and the right pedicle at L5 pedicle into the anterior one-third at these 3 levels. A gentle contrast injection demonstrated a trabecular pattern of contrast with a prominent vein opacifying at L5. This necessitated the use of Gel-Foam pledgets into the 13 gauge Cook spinal needle at all 3 levels prior to the delivery of the methylmethacrylate mixture. At this time, methylmethacrylate mixture was reconstituted in the Stryker delivery device system. Under biplane intermittent fluoroscopy, the methylmethacrylate was then injected into the T6, T7 and at the L5 levels with filling of the vertebral bodies. No extravasation was noted into the disk spaces or posteriorly into the spinal canal. No epidural venous contamination was seen. A tiny amount of the methylmethacrylate mixture was seen to extrude into a paraspinous vein anteriorly at L5 where it remained stable throughout the procedure. The patient did not exhibit any hemodynamic or respiratory dysfunction. The needles were then removed. Hemostasis was achieved at the skin entry sites. There were no acute complications. Patient tolerated the procedure well. The patient was then sent back to her floor in good and stable condition. IMPRESSION: 1. Status post vertebral body augmentation for painful compression fracture at T6, T7 and L5 using vertebroplasty technique. Electronically Signed   By: Luanne Bras M.D.   On: 04/15/2016 18:23   Ir  Radiologist Eval & Mgmt  Result Date: 04/11/2016 EXAM: NEW PATIENT OFFICE VISIT CHIEF COMPLAINT: Severe thoracic and lumbosacral pain due to multiple compression fractures in the thoracic and lumbar regions. The patient also has sacral insufficiency fractures. Current Pain Level: 1-10 HISTORY OF PRESENT ILLNESS: The patient is a 81 year old right-handed lady who has been referred for evaluation and treatment of severe pain in the thoracolumbar and sacral regions subsequent to a fall which she incurred towards the end of July. The patient is accompanied by her daughter and her son. The patient reports pain developing severe in intensity 2 weeks after a fall. She describes this pain as a 12 out of 10 in terms of pain intensity. The pain is usually relieved with a heating pad or lying still. The pain apparently is in the mid thoracic region and also in the entire lumbar spine and along the sacroiliac complex. The patient is ambulatory with a walker though barely. Prior to the fall the patient was ambulatory independently and driving intermittently and taking care of herself. She lives by herself. At the present time her pain is relieved with oxycodone which she takes every 6 hours. However, this has resulted in her being significantly slowed cognitively  and motor wise. Also, this results in her lethargy and also loss of appetite and constipation. Her appetite is low and she has lost about 10-15 pounds since the onset of these symptoms. She denies any symptoms of autonomic dysfunction of her bowel or bladder. She denies any symptoms of dysuria, frequency of micturition or hematuria. The patient is mostly limited to a reclining chair much of the day since this fall with subsequent discovery of multiple thoracic and lumbar sacral fractures. The pain is significantly increased with prolonged standing or stooping. Past Medical History: Congestive heart failure. Patient is post MVR. Generalized osteoarthritis. History of  clostridium difficile infection. Mitral valve replacement and mechanical bowel surgery in May of 2015. Hyperlipidemia. History of subacute bacterial endocarditis in 1994. Past Surgical History: Removal of lump from the breast. Reoperation for changes of mitral valve. The replacement of her mitral valve was in March of 2016. Medications: Acetaminophen. Pacerone. Ascorbic acid. Aspirin. Furosemide. Metolazone. Metoprolol XL. Multivitamin tablets. Protonix. MiraLax. Potassium chloride supplementation. Spironolactone. Tramadol stopped. Coumadin. Valium. Hydrocodone 5/325. Allergies: Ace inhibitors which cause coughing. Hydrocodone causes itching and nausea. Keflex causes a rash. Mercury unknown. Nickel unknown. Penicillins cause a rash. Silver causes dermatitis, severe itching. Social History: Patient widowed. Has a son and a daughter. Denies smoking cigarettes or drinking alcohol. Denies use of illicit chemicals. Family History: Mother deceased at age 66 unknown cause. Father deceased at age 65 unknown cause. History of heart problems, high blood pressure, kidney disease and stroke in the family. REVIEW OF SYSTEMS: Apart from as mentioned above, largely the review of systems is unremarkable. She does have easy bruisability because of the fact that she is on Coumadin. Also the patient reports swelling of her ankles because of her decreased mobility, due to the recent fractures as described above. She reportedly has not documented DVT. The remainder of her review systems is as mentioned above. PHYSICAL EXAMINATION: On brief examination, the patient appears to be in distress crash forward because of the pain. Neurologically she is alert, awake and oriented to time, place, space. No lateralizing abnormal cranial nerve, motor, sensory or coordination difficulties. Station and gait not tested. Patient exquisitely tender in the mid thoracic spine and also in the lower lumbar spine. ASSESSMENT AND PLAN: The patient's recent  MRI scan of the lumbar and thoracic spine, and the CT of the thoracic and lumbar spine were reviewed. These depict the presence of acute to subacute compression fractures at T4 and T6 with signal abnormalities along the superior endplate at T7 which may represent contusion versus mild superior endplate fracture. A small retropulsion noted at T4 and at T6. Superior endplate compression fracture is seen noted at T5, inferior endplate at T4 with loss of approximately 50% vertebral body height and at L2 along the inferior end plate with mild loss of the vertebral body height. Multilevel degenerate disc disease is noted most notably at L3-L4, L4-L5. The MRI and CT also depict the sacral insufficiency fractures more prominent on the right side but more recent possibly on the left side. The option of vertebral body augmentation at these fracture sites to alleviate pain, and to prevent further collapse of vertebral bodies and also to lessen or significantly reduce the need for opioid pain killers was reviewed in detail. The procedure of vertebroplasty/sacroplasty or kyphoplasty were reviewed at the levels examined. It was felt that the patient would benefit significantly from vertebral body augmentations at T4, T6 and also of the sacral insufficiency fractures to began with. Following that,  should the patient's symptoms persist in the lumbosacral region, vertebral body augmentation would be performed at L2, L4 and L5. The procedure, the risks, the benefits and the alternatives were all reviewed in detail. The T4, T6 and the sacral insufficiency fractures will be treated first. Thereafter, the L2, L4 and L5 fractures will be treated. The patient will need to come off the Coumadin for least 5 days prior to the procedure and started on bridging with Lovenox subcutaneously, INR will be drawn on the 6th day following the stoppage of the Coumadin. If within normal limits, the patient will undergo the procedure at the thoracic  levels and in the sacrum followed perhaps a week to 2 weeks later treatment of the L2, L4 and L5 levels. Questions were answered to their satisfaction. The patient and the family would like to proceed with the procedures as described. These will be scheduled at the earliest possible with the above plans. The patient, her daughter and son leave with good understanding and agreement with the above management plan. Electronically Signed   By: Luanne Bras M.D.   On: 04/09/2016 18:34   US Abdomen Limited Ruq  Result Date: 04/13/2016 CLINICAL DATA:  Abdominal pain for 1 week. EXAM: US ABDOMEN LIMITED - RIGHT UPPER QUADRANT COMPARISON:  None. FINDINGS: Gallbladder: Layering sludge and multiple stones identified in the lumen. Gallbladder wall thickness upper normal at 3 mm. No pericholecystic fluid in the sonographer reports no sonographic Murphy sign. Common bile duct: Diameter: 5 mm Liver: 7 mm hypoechoic lesion inferior liver, likely a cyst. IMPRESSION: Cholelithiasis. Electronically Signed   By: Misty Stanley M.D.   On: 04/13/2016 08:50      Filed Weights   04/16/16 0500 04/17/16 0505 04/18/16 0500  Weight: 72.6 kg (160 lb) 71.8 kg (158 lb 3.2 oz) 71.1 kg (156 lb 11.2 oz)     Microbiology: Recent Results (from the past 240 hour(s))  Surgical pcr screen     Status: None   Collection Time: 04/11/16 11:04 PM  Result Value Ref Range Status   MRSA, PCR NEGATIVE NEGATIVE Final   Staphylococcus aureus NEGATIVE NEGATIVE Final    Comment:        The Xpert SA Assay (FDA approved for NASAL specimens in patients over 14 years of age), is one component of a comprehensive surveillance program.  Test performance has been validated by [facility] for patients greater than or equal to 65 year old. It is not intended to diagnose infection nor to guide or monitor treatment.        Blood Culture No results found for: SDES, [place], CULT, REPTSTATUS    Labs: Results for orders placed  or performed during the hospital encounter of 04/10/16 (from the past 48 hour(s))  Heparin level (unfractionated)     Status: None   Collection Time: 04/17/16  4:37 PM  Result Value Ref Range   Heparin Unfractionated 0.39 0.30 - 0.70 IU/mL    Comment:        IF HEPARIN RESULTS ARE BELOW EXPECTED VALUES, AND PATIENT DOSAGE HAS BEEN CONFIRMED, SUGGEST FOLLOW UP TESTING OF ANTITHROMBIN III LEVELS.   CBC     Status: Abnormal   Collection Time: 04/18/16  2:59 AM  Result Value Ref Range   WBC 4.0 4.0 - 10.5 K/uL   RBC 4.33 3.87 - 5.11 MIL/uL   Hemoglobin 11.5 (L) 12.0 - 15.0 g/dL   HCT 36.1 36.0 - 46.0 %   MCV 83.4 78.0 - 100.0 fL  MCH 26.6 26.0 - 34.0 pg   MCHC 31.9 30.0 - 36.0 g/dL   RDW 21.4 (H) 11.5 - 15.5 %   Platelets 175 150 - 400 K/uL  Heparin level (unfractionated)     Status: None   Collection Time: 04/18/16  2:59 AM  Result Value Ref Range   Heparin Unfractionated 0.37 0.30 - 0.70 IU/mL    Comment:        IF HEPARIN RESULTS ARE BELOW EXPECTED VALUES, AND PATIENT DOSAGE HAS BEEN CONFIRMED, SUGGEST FOLLOW UP TESTING OF ANTITHROMBIN III LEVELS.   Protime-INR     Status: Abnormal   Collection Time: 04/18/16  2:59 AM  Result Value Ref Range   Prothrombin Time 22.0 (H) 11.4 - 15.2 seconds   INR 8.93   Basic metabolic panel     Status: Abnormal   Collection Time: 04/18/16  2:59 AM  Result Value Ref Range   Sodium 139 135 - 145 mmol/L   Potassium 3.6 3.5 - 5.1 mmol/L   Chloride 105 101 - 111 mmol/L   CO2 26 22 - 32 mmol/L   Glucose, Bld 86 65 - 99 mg/dL   BUN 16 6 - 20 mg/dL   Creatinine, Ser 1.39 (H) 0.44 - 1.00 mg/dL   Calcium 9.1 8.9 - 10.3 mg/dL   GFR calc non Af Amer 35 (L) >60 mL/min   GFR calc Af Amer 41 (L) >60 mL/min    Comment: (NOTE) The eGFR has been calculated using the CKD EPI equation. This calculation has not been validated in all clinical situations. eGFR's persistently <60 mL/min signify possible Chronic Kidney Disease.    Anion gap 8 5 - 15   CBC     Status: Abnormal   Collection Time: 04/19/16  6:23 AM  Result Value Ref Range   WBC 4.1 4.0 - 10.5 K/uL    Comment: CONSISTENT WITH PREVIOUS RESULT   RBC 4.49 3.87 - 5.11 MIL/uL   Hemoglobin 11.7 (L) 12.0 - 15.0 g/dL    Comment: CONSISTENT WITH PREVIOUS RESULT   HCT 37.9 36.0 - 46.0 %   MCV 84.4 78.0 - 100.0 fL   MCH 26.1 26.0 - 34.0 pg   MCHC 30.9 30.0 - 36.0 g/dL   RDW 21.7 (H) 11.5 - 15.5 %   Platelets 192 150 - 400 K/uL    Comment: CONSISTENT WITH PREVIOUS RESULT  Heparin level (unfractionated)     Status: None   Collection Time: 04/19/16  6:23 AM  Result Value Ref Range   Heparin Unfractionated 0.54 0.30 - 0.70 IU/mL    Comment:        IF HEPARIN RESULTS ARE BELOW EXPECTED VALUES, AND PATIENT DOSAGE HAS BEEN CONFIRMED, SUGGEST FOLLOW UP TESTING OF ANTITHROMBIN III LEVELS.   Protime-INR     Status: Abnormal   Collection Time: 04/19/16  6:23 AM  Result Value Ref Range   Prothrombin Time 33.9 (H) 11.4 - 15.2 seconds   INR 3.26      Lipid Panel  No results found for: CHOL, TRIG, HDL, CHOLHDL, VLDL, LDLCALC, LDLDIRECT   No results found for: HGBA1C   Lab Results  Component Value Date   CREATININE 1.39 (H) 04/18/2016     HPI :  [name] a 81 y.o.femalewith medical history significant for mitral valve replacement on Coumadin, chronic diastolic CHF, GERD, and hypertension who presents to the emergency department for evaluation of severe pain in the mid and low back. Patient reports suffering a mechanical fall at home approximately  3 weeks ago, and another fall approximately one week ago. She has had severe pain in the mid and lower back since that time, but denies numbness or weakness in the lower extremities, fevers or chills, incontinence, or saddle anesthesia. There was no loss of consciousness or head strike with these falls and she had been evaluated with CT, and later MRI of the lumbar and thoracic spine. Imaging revealed compression  fractures of L3, L4, L5, T4, T6, and possibly T7. Other chronic vertebral compression fractures were also noted. Patient has been suffering with severe pain secondary to these injuries despite treatment at home with oxycodone. She has been evaluated by interventional radiology and there was plan for possible kyphoplasty. She presents today due to severe uncontrolled pain despite aggressive treatment with oxycodone at home  HOSPITAL COURSE  1. Multiple compression fractures with intractable pain  - Pt has suffered 2 mechanical falls at home in the last mo and has been essentially bed-bound with severe pain despite treatment with oxycodone at home - IR  was consulted -Underwent Kyphoplasty 10-16 for L 5, T 6 and T 7.  -PT consulted. Recommended SNF  2. 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 INR now therapeutic -cardio consulted.  10/13 -Cardio recommended bridge with heparin.  Evaluated by Dr. Debara Pickett. LVEF of 40-45%. Patient would like to establish with Dr. Haroldine Laws for this. Spoke at length with her daughter and she wants to establish with our group. Cardiology recommended Dr. Rockey Situ in [place]  3. CKD stage III  - SCr 1.75 on admission, up from apparent baseline of ~1.4--1.6 - resume lasix increase to home dose today. . Holding  spironolactone.  -follow renal function daily. Stable.   4. Chronic systolic CHF  - TTE (07/02/73) with EF 40-45%, moderate TR - Managed with Lasix, Aldactone, Toprol at home - Follow daily wts and I/O's; continue Toprol  -Continue with 40 mg BID.  -cr stable,.  Bradycardia, asymptomatic, holder parameter for toprol.  Weight 159--160-- continue home meds     Right side chest pain, pain reproduce on palpation of her rib right  side.   LFT normal, mild elevation of Alkaline phosphatase, GGT normal. Elevation of ALk phosphatase likely from bone.  Cholelithiasis, likely asymptomatic. Further out patient follow up/ .  Dedicated rib x ray  With questionable 9 th rib fracture. Incentive spirometry. Pain management  Constipation; miralax, senna, dulcolax suppository.   Resolved   Elevated Alkaline phosphatase.  GTT normal , Making elevation of Alk phosphatase less likely related to liver or bile duct  [place] cholelithiasis.     Wound care Wound type: Left outer leg with hematoma related to an abrasion; full thickness wound 3X3.5X.1cm; covered by a blood-filled blister.  Removed loose outer skin, revealing mod amt red drainage, dark red woundbed, and mod amt old bloody drainage, no odor.  Bilat buttocks with 2 stage 2 pressure injuries; each is approx 1X1X.1cm, dry peeling skin surrounding pink dry wound bed.  Daughter assessed all wounds and stated buttocks are improving.   Right anterior calf full thickness wound; .2X.2X.1cm, dry yellow wound bed. No odor or drainage Right posterior thigh with partial thickness abrasion, .2X.2X.1cm Left posterior thigh with partial thickness abrasion, .1X.1X.1cm   Dressing procedure/placement/frequency: Foam dressings to protect and promote healing to all sites.  Xeroform to  hematoma site to promote drying and healing. Hematoma sites are high risk to evolve into eschar as they evolve, despite optimal plan of care. Discussed with patient and daugther and they will follow-up with the outpatient wound care center if the hematoma site does not improve.   Discharge Exam:   Blood pressure (!) 132/56, pulse (!) 58, temperature 98.9 F (37.2 C), temperature source Oral, resp. rate 16, height 5' 6"  (1.676 m), weight 71.1 kg (156 lb 11.2 oz), SpO2 94 %.  General exam: Appears calm and comfortable  Respiratory system: Bilateral crackles. Respiratory effort normal. Cardiovascular system: S1 &  S2 heard, RRR. No JVD, murmurs, rubs, gallops or clicks. No pedal edema. Gastrointestinal system: Abdomen is nondistended, soft and nontender. No organomegaly or masses felt. Normal bowel sounds heard. Central nervous system: Alert and oriented. No focal neurological deficits. Extremities: Symmetric 5 x 5 power. Skin: blister left leg, bloody.  Psychiatry: Judgement and insight appear normal. Mood & affect appropriate.       Contact information for follow-up providers    Rusty Aus, MD. Schedule an appointment as soon as possible for a visit in 2 day(s).   Specialty:  Internal Medicine Why:  Hospital follow-up Contact information: [place] [id number] [phone]            Contact information for after-discharge care    Destination    [facility] .   Specialty:  [place] information: [place] [phone]                  Signed: Reyne Dumas 04/19/2016, 11:02 AM        Time spent >45 mins

## 2016-04-20 LAB — PROTIME-INR
INR: 2.9
PROTHROMBIN TIME: 30.9 s — AB (ref 11.4–15.2)

## 2016-04-21 LAB — PROTIME-INR
INR: 2.73
PROTHROMBIN TIME: 29.5 s — AB (ref 11.4–15.2)

## 2016-04-22 LAB — PROTIME-INR
INR: 3.05
PROTHROMBIN TIME: 32.2 s — AB (ref 11.4–15.2)

## 2016-04-23 LAB — PROTIME-INR
INR: 2.58
Prothrombin Time: 28.2 seconds — ABNORMAL HIGH (ref 11.4–15.2)

## 2016-04-24 ENCOUNTER — Encounter: Payer: Medicare Other | Admitting: Internal Medicine

## 2016-04-24 DIAGNOSIS — I4891 Unspecified atrial fibrillation: Secondary | ICD-10-CM | POA: Diagnosis not present

## 2016-04-24 DIAGNOSIS — I890 Lymphedema, not elsewhere classified: Secondary | ICD-10-CM | POA: Diagnosis not present

## 2016-04-24 DIAGNOSIS — I87333 Chronic venous hypertension (idiopathic) with ulcer and inflammation of bilateral lower extremity: Secondary | ICD-10-CM | POA: Diagnosis not present

## 2016-04-24 DIAGNOSIS — Z7901 Long term (current) use of anticoagulants: Secondary | ICD-10-CM | POA: Diagnosis not present

## 2016-04-24 DIAGNOSIS — I5022 Chronic systolic (congestive) heart failure: Secondary | ICD-10-CM | POA: Diagnosis not present

## 2016-04-24 DIAGNOSIS — Z952 Presence of prosthetic heart valve: Secondary | ICD-10-CM | POA: Diagnosis not present

## 2016-04-24 DIAGNOSIS — X58XXXS Exposure to other specified factors, sequela: Secondary | ICD-10-CM | POA: Diagnosis not present

## 2016-04-24 DIAGNOSIS — Z79899 Other long term (current) drug therapy: Secondary | ICD-10-CM | POA: Diagnosis not present

## 2016-04-24 DIAGNOSIS — S81812S Laceration without foreign body, left lower leg, sequela: Secondary | ICD-10-CM | POA: Diagnosis not present

## 2016-04-24 DIAGNOSIS — L97211 Non-pressure chronic ulcer of right calf limited to breakdown of skin: Secondary | ICD-10-CM | POA: Diagnosis not present

## 2016-04-24 LAB — COMPREHENSIVE METABOLIC PANEL
ALT: 14 U/L (ref 14–54)
AST: 19 U/L (ref 15–41)
Albumin: 3.7 g/dL (ref 3.5–5.0)
Alkaline Phosphatase: 171 U/L — ABNORMAL HIGH (ref 38–126)
Anion gap: 10 (ref 5–15)
BUN: 18 mg/dL (ref 6–20)
CHLORIDE: 98 mmol/L — AB (ref 101–111)
CO2: 26 mmol/L (ref 22–32)
CREATININE: 1.37 mg/dL — AB (ref 0.44–1.00)
Calcium: 9.1 mg/dL (ref 8.9–10.3)
GFR, EST AFRICAN AMERICAN: 42 mL/min — AB (ref >60)
GFR, EST NON AFRICAN AMERICAN: 36 mL/min — AB (ref >60)
Glucose, Bld: 86 mg/dL (ref 65–99)
POTASSIUM: 3.7 mmol/L (ref 3.5–5.1)
SODIUM: 134 mmol/L — AB (ref 135–145)
Total Bilirubin: 0.9 mg/dL (ref 0.3–1.2)
Total Protein: 6.6 g/dL (ref 6.5–8.1)

## 2016-04-24 LAB — CBC WITH DIFFERENTIAL/PLATELET
BASOS ABS: 0.1 10*3/uL (ref 0–0.1)
Basophils Relative: 1 %
EOS ABS: 0.1 10*3/uL (ref 0–0.7)
EOS PCT: 2 %
HCT: 37.5 % (ref 35.0–47.0)
Hemoglobin: 12.4 g/dL (ref 12.0–16.0)
Lymphocytes Relative: 10 %
Lymphs Abs: 0.5 10*3/uL — ABNORMAL LOW (ref 1.0–3.6)
MCH: 27.2 pg (ref 26.0–34.0)
MCHC: 33 g/dL (ref 32.0–36.0)
MCV: 82.4 fL (ref 80.0–100.0)
MONO ABS: 0.6 10*3/uL (ref 0.2–0.9)
Monocytes Relative: 11 %
Neutro Abs: 3.6 10*3/uL (ref 1.4–6.5)
Neutrophils Relative %: 76 %
PLATELETS: 196 10*3/uL (ref 150–440)
RBC: 4.55 MIL/uL (ref 3.80–5.20)
RDW: 21.2 % — AB (ref 11.5–14.5)
WBC: 4.8 10*3/uL (ref 3.6–11.0)

## 2016-04-25 LAB — PROTIME-INR
INR: 3.18
PROTHROMBIN TIME: 33.3 s — AB (ref 11.4–15.2)

## 2016-04-25 NOTE — Progress Notes (Signed)
NARCISSUS, DETWILER ([id number]) Visit Report for 04/24/2016 Arrival Information Details Patient Name: [name], [name]. Date of Service: 04/24/2016 3:00 PM Medical Record Patient Account Number: [id number] Number: Treating RN: [name] 1935-06-05 (81 y.o. Other Clinician: Date of Birth/Sex: Female) Treating [name], [name] Primary Care Physician/Extender: Claudette Laws Physician: Referring Physician: [name] in Treatment: 66 Visit Information History Since Last Visit All ordered tests and consults were completed: No Patient Arrived: Wheel Chair Added or deleted any medications: No Arrival Time: 15:23 Any new allergies or adverse reactions: No Accompanied By: son Had a fall or experienced change in No Transfer Assistance: EasyPivot Patient activities of daily living that may affect Lift risk of falls: Patient Identification Verified: Yes Signs or symptoms of abuse/neglect since last No Secondary Verification Process Yes visito Completed: Hospitalized since last visit: No Patient Requires Transmission- No Pain Present Now: No Based Precautions: Patient Has Alerts: Yes Patient Alerts: Patient on Blood Thinner [place] Signature(s) Signed: 04/24/2016 5:37:51 PM By: [name] Entered By: [name] on 04/24/2016 15:27:12 [name], [name] ([id number]) -------------------------------------------------------------------------------- Encounter Discharge Information Details Patient Name: Carrie Mulligan C. Date of Service: 04/24/2016 3:00 PM Medical Record Patient Account Number: [id number] Number: Treating RN: [name] [birth year] (81 y.o. Other Clinician: Date of Birth/Sex: Female) Treating [name], [name] Primary Care Physician/Extender: Claudette Laws Physician: Referring Physician: [name] in Treatment: 22 Encounter Discharge Information  Items Discharge Pain Level: 0 Discharge Condition: Stable Ambulatory Status: Wheelchair Discharge Destination: Nursing Home Transportation: Private Auto Accompanied By: son Schedule Follow-up Appointment: Yes Medication Reconciliation completed and provided to Patient/Care Yes [name]: Provided on Clinical Summary of Care: 04/24/2016 Form Type Recipient Paper Patient [place] Electronic Signature(s) Signed: 04/24/2016 4:36:50 PM By: Ruthine Dose Entered By: Ruthine Dose on 04/24/2016 16:36:49 [place], [place]. ([id number]) -------------------------------------------------------------------------------- Lower Extremity Assessment Details Patient Name: Carrie Mulligan C. Date of Service: 04/24/2016 3:00 PM Medical Record Patient Account Number: [id number] Number: Treating RN: [name] 12/12/1934 (81 y.o. Other Clinician: Date of Birth/Sex: Female) Treating [name], [name] Primary Care Physician/Extender: Claudette Laws Physician: Referring Physician: [name] in Treatment: 22 Vascular Assessment Pulses: Posterior Tibial Dorsalis Pedis Palpable: [Left:Yes] [Right:Yes] Extremity colors, hair growth, and conditions: Extremity Color: [Left:Mottled] [Right:Mottled] Temperature of Extremity: [Left:Warm] [Right:Warm] Capillary Refill: [Left:< 3 seconds] [Right:< 3 seconds] Toe Nail Assessment Left: Right: Thick: Yes Yes Discolored: Yes Yes Deformed: No No Improper Length and Hygiene: No No Electronic Signature(s) Signed: 04/24/2016 5:37:51 PM By: [name] Entered By: [name] on 04/24/2016 15:35:23 [name], [name] ([id number]) -------------------------------------------------------------------------------- Multi Wound Chart Details Patient Name: Carrie Mulligan C. Date of Service: 04/24/2016 3:00 PM Medical Record Patient Account Number: [id number] Number: Treating RN: [name] [birth year] (81 y.o.  Other Clinician: Date of Birth/Sex: Female) Treating [name], [name] Primary Care Physician/Extender: Claudette Laws Physician: Referring Physician: [name] in Treatment: 22 Vital Signs Height(in): 68 Pulse(bpm): 56 Weight(lbs): 178 Blood Pressure 113/48 (mmHg): Body Mass Index(BMI): 27 Temperature(F): 97.7 Respiratory Rate 16 (breaths/min): Photos: [4:No Photos] [6:No Photos] [7:No Photos] Wound Location: [4:Right Lower Leg - Proximal] [6:Left Knee - Lateral] [7:Right Lower Leg - Lateral] Wounding Event: [4:Shear/Friction] [6:Trauma] [7:Trauma] Primary Etiology: [4:Skin Tear] [6:Trauma, Other] [7:Trauma, Other] Comorbid History: [4:Cataracts, Arrhythmia, Hypotension, Osteoarthritis] [6:Cataracts, Arrhythmia, Hypotension, Osteoarthritis] [7:Cataracts, Arrhythmia, Hypotension, Osteoarthritis] Date Acquired: [4:02/28/2016] [6:04/06/2016] [7:04/06/2016] Weeks of Treatment: [4:8] [6:0] [7:0] Wound Status: [4:Healed - Epithelialized] [6:Open] [7:Open] Measurements [name] x W x D 0x0x0 [6:1.6x3.1x0.1] [7:0.1x0.1x0.1] (cm) Area (cm) : [  4:0] [6:3.896] [7:0.008] Volume (cm) : [4:0] [6:0.39] [7:0.001] % Reduction in Area: [4:100.00%] [6:N/A] [7:N/A] % Reduction in Volume: 100.00% [6:N/A] [7:N/A] Classification: [4:Partial Thickness] [6:Partial Thickness] [7:Partial Thickness] Exudate Amount: [4:None Present] [6:Large] [7:Small] Exudate Type: [4:N/A] [6:Serosanguineous] [7:Serosanguineous] Exudate Color: [4:N/A] [6:red, brown] [7:red, brown] Wound Margin: [4:N/A] [6:Distinct, outline attached] [7:Distinct, outline attached] Granulation Amount: [4:None Present (0%)] [6:None Present (0%)] [7:None Present (0%)] Necrotic Amount: [4:None Present (0%)] [6:Large (67-100%)] [7:Large (67-100%)] Necrotic Tissue: [4:N/A] [6:Eschar] [7:Eschar] Epithelialization: [4:N/A] [6:None] [7:None] Periwound Skin Texture: No Abnormalities Noted [6:Edema: Yes] [7:Edema: Yes] Periwound Skin No  Abnormalities Noted Moist: Yes No Abnormalities Noted Moisture: Periwound Skin Color: No Abnormalities Noted Erythema: Yes No Abnormalities Noted Erythema Location: N/A Circumferential N/A Temperature: N/A No Abnormality N/A Tenderness on No Yes No Palpation: Wound Preparation: Topical Anesthetic Ulcer Cleansing: Ulcer Cleansing: Applied: None Rinsed/Irrigated with Rinsed/Irrigated with Saline Saline Topical Anesthetic Topical Anesthetic Applied: Other: lidocaine Applied: Other: lidocaine 4% 4% Treatment Notes Electronic Signature(s) Signed: 04/24/2016 5:37:51 PM By: [name] Entered By: [name] on 04/24/2016 15:49:09 Rudnicki, [name] ([id number]) -------------------------------------------------------------------------------- Multi-Disciplinary Care Plan Details Patient Name: Carrie Kussmaul. Date of Service: 04/24/2016 3:00 PM Medical Record Patient Account Number: [id number] Number: Treating RN: [name] 1935-03-19 (80 y.o. Other Clinician: Date of Birth/Sex: Female) Treating [name], [name] Primary Care Physician/Extender: Claudette Laws Physician: Referring Physician: [name] in Treatment: 89 Active Inactive Orientation to the Wound Care Program Nursing Diagnoses: Knowledge deficit related to the wound healing center program Goals: Patient/caregiver will verbalize understanding of the [facility] Program Date Initiated: 11/22/2015 Goal Status: Active Interventions: Provide education on orientation to the wound center Notes: Venous Leg Ulcer Nursing Diagnoses: Knowledge deficit related to disease process and management Potential for venous Insuffiency (use before diagnosis confirmed) Goals: Patient will maintain optimal edema control Date Initiated: 11/22/2015 Goal Status: Active Patient/caregiver will verbalize understanding of disease process and disease management Date Initiated: 11/22/2015 Goal  Status: Active Verify adequate tissue perfusion prior to therapeutic compression application Date Initiated: 11/22/2015 Goal Status: Active Interventions: Assess peripheral edema status every visit. YANELY, MAST ([id number]) Compression as ordered Provide education on venous insufficiency Treatment Activities: Non-invasive vascular studies : 11/22/2015 Therapeutic compression applied : 11/22/2015 Notes: Wound/Skin Impairment Nursing Diagnoses: Impaired tissue integrity Knowledge deficit related to ulceration/compromised skin integrity Goals: Patient/caregiver will verbalize understanding of skin care regimen Date Initiated: 11/22/2015 Goal Status: Active Ulcer/skin breakdown will have a volume reduction of 30% by week 4 Date Initiated: 11/22/2015 Goal Status: Active Ulcer/skin breakdown will have a volume reduction of 50% by week 8 Date Initiated: 11/22/2015 Goal Status: Active Ulcer/skin breakdown will have a volume reduction of 80% by week 12 Date Initiated: 11/22/2015 Goal Status: Active Ulcer/skin breakdown will heal within 14 weeks Date Initiated: 11/22/2015 Goal Status: Active Interventions: Assess patient/caregiver ability to perform ulcer/skin care regimen upon admission and as needed Assess ulceration(s) every visit Provide education on ulcer and skin care Treatment Activities: Skin care regimen initiated : 11/22/2015 Topical wound management initiated : 11/22/2015 Notes: Electronic Signature(s) Signed: 04/24/2016 5:37:51 PM By: De Burrs, [name] ([id number]) Entered By: [name] on 04/24/2016 15:49:00 [place], [name] ([id number]) -------------------------------------------------------------------------------- Pain Assessment Details Patient Name: Carrie Mulligan C. Date of Service: 04/24/2016 3:00 PM Medical Record Patient Account Number: [id number] Number: Treating RN: [name] 1935-01-16 (80 y.o. Other  Clinician: Date of Birth/Sex: Female) Treating [name], [name] Primary Care Physician/Extender: Claudette Laws Physician: Referring Physician: [name] in Treatment: 22 Active  Problems Location of Pain Severity and Description of Pain Patient Has Paino No Site Locations With Dressing Change: No Pain Management and Medication Current Pain Management: Electronic Signature(s) Signed: 04/24/2016 5:37:51 PM By: [name] Entered By: [name] on 04/24/2016 15:27:19 [place], [name] ([id number]) -------------------------------------------------------------------------------- Patient/Caregiver Education Details Patient Name: Carrie Kussmaul. Date of Service: 04/24/2016 3:00 PM Medical Record Patient Account Number: [id number] Number: Treating RN: [name] 09/04/1934 (80 y.o. Other Clinician: Date of Birth/Gender: Female) Treating [name], [name] Primary Care Physician/Extender: Claudette Laws Physician: Suella [place] in Treatment: 22 Referring Physician: [name] Education Assessment Education Provided To: Patient Education Topics Provided Wound/Skin Impairment: Handouts: Other: change dressing as ordered Methods: Demonstration, Explain/Verbal Responses: State content correctly Electronic Signature(s) Signed: 04/24/2016 5:37:51 PM By: [name] Entered By: [name] on 04/24/2016 16:08:13 [place], [place]. ([id number]) -------------------------------------------------------------------------------- Wound Assessment Details Patient Name: Carrie Mulligan C. Date of Service: 04/24/2016 3:00 PM Medical Record Patient Account Number: [id number] Number: Treating RN: [name] 04/09/1935 (80 y.o. Other Clinician: Date of Birth/Sex: Female) Treating [name], [name] Primary Care Physician/Extender: Claudette Laws Physician: Referring Physician: [name] in Treatment: 22 Wound  Status Wound Number: 4 Primary Skin Tear Etiology: Wound Location: Right Lower Leg - Proximal Wound Status: Healed - Epithelialized Wounding Event: Shear/Friction Comorbid Cataracts, Arrhythmia, Hypotension, Date Acquired: 02/28/2016 History: Osteoarthritis Weeks Of Treatment: 8 Clustered Wound: No Photos Photo Uploaded By: [name] on 04/24/2016 16:49:00 Wound Measurements Length: (cm) 0 % Reduction in Width: (cm) 0 % Reduction in Depth: (cm) 0 Tunneling: Area: (cm) 0 Undermining: Volume: (cm) 0 Area: 100% Volume: 100% No No Wound Description Classification: Partial Thickness Exudate Amount: None Present Foul Odor After Cleansing: No Wound Bed Granulation Amount: None Present (0%) Necrotic Amount: None Present (0%) Periwound Skin Texture Texture Color Judice, [name] ([id number]) No Abnormalities Noted: No No Abnormalities Noted: No Moisture No Abnormalities Noted: No Wound Preparation Topical Anesthetic Applied: None Electronic Signature(s) Signed: 04/24/2016 5:37:51 PM By: [name] Entered By: [name] on 04/24/2016 15:38:07 [place], [name] ([id number]) -------------------------------------------------------------------------------- Wound Assessment Details Patient Name: [name], [name] Date of Service: 04/24/2016 3:00 PM Medical Record Patient Account Number: [id number] Number: Treating RN: [name] [birth year] (80 y.o. Other Clinician: Date of Birth/Sex: Female) Treating [name], [name] Primary Care Physician/Extender: Claudette Laws Physician: Referring Physician: [name] in Treatment: 22 Wound Status Wound Number: 6 Primary Trauma, Other Etiology: Wound Location: Left Knee - Lateral Wound Status: Open Wounding Event: Trauma Comorbid Cataracts, Arrhythmia, Hypotension, Date Acquired: 04/06/2016 History: Osteoarthritis Weeks Of Treatment: 0 Clustered Wound: No Photos Photo  Uploaded By: [name] on 04/24/2016 16:49:44 Wound Measurements Length: (cm) 1.6 % Reduction i Width: (cm) 3.1 % Reduction i Depth: (cm) 0.1 Epithelializa Area: (cm) 3.896 Tunneling: Volume: (cm) 0.39 Undermining: n Area: n Volume: tion: None No No Wound Description Classification: Partial Thickness Wound Margin: Distinct, outline attached Exudate Amount: Large Exudate Type: Serosanguineous Exudate Color: red, brown Foul Odor After Cleansing: No Wound Bed Granulation Amount: None Present (0%) Exposed Structure Necrotic Amount: Large (67-100%) Fascia Exposed: No Bishara, [name] ([id number]) Necrotic Quality: Eschar Fat Layer Exposed: No Tendon Exposed: No Muscle Exposed: No Joint Exposed: No Bone Exposed: No Limited to Skin Breakdown Periwound Skin Texture Texture Color No Abnormalities Noted: No No Abnormalities Noted: No Localized Edema: Yes Erythema: Yes Erythema Location: Circumferential Moisture No Abnormalities Noted: No Temperature / Pain Moist: Yes Temperature: No Abnormality Tenderness on Palpation: Yes Wound Preparation Ulcer Cleansing: Rinsed/Irrigated with  Saline Topical Anesthetic Applied: Other: lidocaine 4%, Treatment Notes Wound #6 (Left, Lateral Knee) 1. Cleansed with: Clean wound with Normal Saline Cleanse wound with antibacterial soap and water 4. Dressing Applied: Calcium Alginate 5. Secondary Dressing Applied ABD Pad 7. Secured with Tape 3 Layer Compression System - Bilateral Notes unna to anchor Electronic Signature(s) Signed: 04/24/2016 5:37:51 PM By: [name] Entered By: [name] on 04/24/2016 15:41:36 [place], [name] ([id number]) -------------------------------------------------------------------------------- Wound Assessment Details Patient Name: [name], Coletta C. Date of Service: 04/24/2016 3:00 PM Medical Record Patient Account Number: [id number] Number: Treating RN:  [name] 1934-12-30 (80 y.o. Other Clinician: Date of Birth/Sex: Female) Treating [name], [name] Primary Care Physician/Extender: Claudette Laws Physician: Referring Physician: [name] in Treatment: 22 Wound Status Wound Number: 7 Primary Trauma, Other Etiology: Wound Location: Right Lower Leg - Lateral Wound Status: Open Wounding Event: Trauma Comorbid Cataracts, Arrhythmia, Hypotension, Date Acquired: 04/06/2016 History: Osteoarthritis Weeks Of Treatment: 0 Clustered Wound: No Photos Photo Uploaded By: [name] on 04/24/2016 16:49:17 Wound Measurements Length: (cm) 0.1 % Reduction i Width: (cm) 0.1 % Reduction i Depth: (cm) 0.1 Epithelializa Area: (cm) 0.008 Tunneling: Volume: (cm) 0.001 Undermining: n Area: n Volume: tion: None No No Wound Description Classification: Partial Thickness Wound Margin: Distinct, outline attached Exudate Amount: Small Exudate Type: Serosanguineous Exudate Color: red, brown Foul Odor After Cleansing: No Wound Bed Granulation Amount: None Present (0%) Exposed Structure Necrotic Amount: Large (67-100%) Fascia Exposed: No Choudhry, [name] ([id number]) Necrotic Quality: Eschar Fat Layer Exposed: No Tendon Exposed: No Muscle Exposed: No Joint Exposed: No Bone Exposed: No Limited to Skin Breakdown Periwound Skin Texture Texture Color No Abnormalities Noted: No No Abnormalities Noted: No Localized Edema: Yes Moisture No Abnormalities Noted: No Wound Preparation Ulcer Cleansing: Rinsed/Irrigated with Saline Topical Anesthetic Applied: Other: lidocaine 4%, Treatment Notes Wound #7 (Right, Lateral Lower Leg) 1. Cleansed with: Clean wound with Normal Saline Cleanse wound with antibacterial soap and water 4. Dressing Applied: Calcium Alginate 5. Secondary Dressing Applied ABD Pad 7. Secured with Tape 3 Layer Compression System - Bilateral Notes unna to anchor Electronic  Signature(s) Signed: 04/24/2016 5:37:51 PM By: [name] Entered By: [name] on 04/24/2016 15:48:05 [place], [name] ([id number]) -------------------------------------------------------------------------------- Wound Assessment Details Patient Name: [name], Darlinda C. Date of Service: 04/24/2016 3:00 PM Medical Record Patient Account Number: [id number] Number: Treating RN: [name] 23-Jul-1934 (80 y.o. Other Clinician: Date of Birth/Sex: Female) Treating [name], [name] Primary Care Physician/Extender: Claudette Laws Physician: Referring Physician: [name] in Treatment: 22 Wound Status Wound Number: 8 Primary Trauma, Other Etiology: Wound Location: Right Lower Leg - Medial Wound Status: Open Wounding Event: Trauma Comorbid Cataracts, Arrhythmia, Hypotension, Date Acquired: 04/06/2016 History: Osteoarthritis Weeks Of Treatment: 0 Clustered Wound: No Photos Photo Uploaded By: [name] on 04/24/2016 16:49:44 Wound Measurements Length: (cm) 0.4 % Reduction i Width: (cm) 0.7 % Reduction i Depth: (cm) 0.1 Epithelializa Area: (cm) 0.22 Tunneling: Volume: (cm) 0.022 Undermining: n Area: n Volume: tion: None No No Wound Description Classification: Partial Thickness Wound Margin: Distinct, outline attached Exudate Amount: Medium Exudate Type: Serosanguineous Exudate Color: red, brown Foul Odor After Cleansing: No Wound Bed Granulation Amount: None Present (0%) Exposed Structure Necrotic Amount: Large (67-100%) Fascia Exposed: No [place], [name] ([id number]) Necrotic Quality: Adherent Slough Fat Layer Exposed: No Tendon Exposed: No Muscle Exposed: No Joint Exposed: No Bone Exposed: No Limited to Skin Breakdown Periwound Skin Texture Texture Color No Abnormalities Noted: No No Abnormalities Noted: No Localized Edema:  Yes Temperature / Pain Moisture Temperature: No Abnormality No Abnormalities  Noted: No Tenderness on Palpation: Yes Moist: Yes Wound Preparation Ulcer Cleansing: Rinsed/Irrigated with Saline Topical Anesthetic Applied: Other: lidocaine 4%, Treatment Notes Wound #8 (Right, Medial Lower Leg) 1. Cleansed with: Clean wound with Normal Saline Cleanse wound with antibacterial soap and water 4. Dressing Applied: Calcium Alginate 5. Secondary Dressing Applied ABD Pad 7. Secured with Tape 3 Layer Compression System - Bilateral Notes unna to anchor Electronic Signature(s) Signed: 04/24/2016 5:37:51 PM By: [name] Entered By: [name] on 04/24/2016 15:59:21 [place]t, Carrie Loletha [name] ([id number]) -------------------------------------------------------------------------------- Vitals Details Patient Name: Carrie Mulligan C. Date of Service: 04/24/2016 3:00 PM Medical Record Patient Account Number: [id number] Number: Treating RN: [name] 04-22-1935 (80 y.o. Other Clinician: Date of Birth/Sex: Female) Treating [name], [name] Primary Care Physician/Extender: Claudette Laws Physician: Referring Physician: [name] in Treatment: 22 Vital Signs Time Taken: 15:27 Temperature (F): 97.7 Height (in): 68 Pulse (bpm): 56 Weight (lbs): 178 Respiratory Rate (breaths/min): 16 Body Mass Index (BMI): 27.1 Blood Pressure (mmHg): 113/48 Reference Range: 80 - 120 mg / dl Notes Made Dr. Dellia Nims aware of pt's BP. Electronic Signature(s) Signed: 04/24/2016 5:37:51 PM By: [name] Entered By: [name] on 04/24/2016 16:43:57

## 2016-04-25 NOTE — Progress Notes (Addendum)
[name], [name] ([id number]) Visit Report for 04/24/2016 Chief Complaint Document Details NASRA, COUNCE Date of Service: 04/24/2016 3:00 PM [name] Name: [name] Account Number: [id number] Medical Record Treating RN: [name] [id number] Number: Other Clinician: [birth year] (81 y.o. Treating Beatryce Colombo Date of Birth/Sex: Female) Physician/Extender: G Primary Care [name] Physician: Referring Physician: [name] in Treatment: 22 Information Obtained from: [name] Chief Complaint [name] here for reevaluation of her left lower extremity wound Electronic Signature(s) Signed: 04/24/2016 6:13:30 PM By: [name] MD Entered By: [name] on 04/24/2016 16:14:28 Martelli, [name] ([id number]) -------------------------------------------------------------------------------- HPI Details [name] Date of Service: 04/24/2016 3:00 PM [name] Name: [name] Account Number: [id number] Medical Record Treating RN: [name] [id number] Number: Other Clinician: 1935/02/08 (81 y.o. Treating Britanni Yarde, [place] Date of Birth/Sex: Female) Physician/Extender: G [place] Physician: Referring Physician: [name] in Treatment: 22 History of Present Illness HPI Description: 11/22/15; this is [name] is a 81 year old woman who lives at home on her own. According to the [name] and her daughter was present she has had long-standing edema in her legs dating back many years. She also has a history of chronic systolic heart failure, atrial fibrillation and is status post mitral valve replacement. Last echocardiogram I see [facility] showed an ejection fraction of 40-45% she is on Lasix 60 mg a day and spironolactone 25 mg a day. Her current problem began in December around Christmas time she developed a small hematoma in the medial part of her left leg which rapidly expanded to a very large hematoma that  required surgical debridement. This situation was complicated by the fact that the [name] is on long-standing Coumadin for mechanical heart valve. She went to the OR had this evacuated on January 4 /17. The wound has gradually improved however she has developed a small wounds around this area and more recently a wound on the right lateral leg. She is weeping edema fluid. The [name] is already been to see vascular surgery. It was recommended that she wear Unna boots, she did not tolerate this due to pain in the left leg. She was then prescribed Juzo stockings and really can't get these on herself although truthfully there is probably too much edema for a Juzo stockings currently. She is not a diabetic and has no history of PAD or claudication that I could elicit. She does not use the external compression pumps reliably. She comes today with notes from her primary physician and Dr. Ronalee Belts both recommending various forms of compression but the [name] does not really complied with them. Has been using the external compression pumps with some regularity but certainly not daily on the right leg and this has helped. I also note that her daughter tells me the history that she does not sleep in bed at home. She has a hospital bed but with her legs up she finds this painful so she sleeps in the couch sitting up with her legs dependent. 11/29/15; the [name] is arrives today accompanied by her son. He expresses satisfaction that she is maintained the compression all week. 12/06/15; the [name] has kept her Profore light wraps on, we have good edema control no major change in the wounds we have been using Aquacel 12/13/15; changed to RTD last week. One of the 3 wounds on her left medial leg is healed she has 2 remaining wounds here and one on the right lateral leg. 12/18/2015 -- the [name] was at Dr. Nino Parsley office today and he  was seeing her for an arterial study. While the wrap was being removed she  had an inadvertent laceration of the left proximal anterior leg which bled quite profusely and a compression dressing was applied over this and the [name] was here to get her Profore wraps done. I was asked to see the [name] to make an assessment and treat appropriately. 12/26/15; the [name]'s injury on the left proximal leg and Steri-Strips removed after soaking. There is an open area here. The original wounds to still open on the right lateral and left medial leg. 01/03/16 [name]'s injury on her proximal left leg looks quite good. Still small open area on the medial left leg which appears to be improving. The area on the right lateral leg still substantially open with no real improvement in wound depth. Her edema control is marginal with a Profore light. We have been using RTD Lipinski, Arnett C. ([id number]) for 3-4 weeks without any major change 01/10/16: wounds without s/s of infection. vascular results are pending regarding arterial studies. 01/17/16; [name] comes in today complaining of severe pain however I think most of this is in the right hip not related to her wounds. She continues with a oval-shaped wound on the right lateral leg, trauma to the left anterior leg just below her tibial plateau. She has a smaller eschar on the left anterior leg. She is being using Prisma however she informs [place] today that she is actually allergic to silver, nose this from a previous application at [facility] some years ago 01/24/16; edema is not so well controlled today, I think I reduced her to [place] bilaterally last week. The area which was a scissor injury on her left upper anterior lower leg is fully epithelialized. She only has a small open area remaining on the medial aspect of her left leg. The oval-shaped wound on the right mid lateral leg may be a bit smaller. Debrided of surface slough nonviable subcutaneous tissue. I had changed to collagen 2 weeks ago in an attempt to get this to  close 01/31/16 all the [name]'s wounds on her left leg give healed. We have good edema control with bilateral Profore lites which she has been compliant with. She still has the oval-shaped wound on the right lateral calf allergic even this appears to be slowly improving. The [name] has juxtalite stockings at home. She states she thinks she can put these on. She also has external compression pumps at home although I think her compliance with this has not been good in the past. She complains today of edema in her thighs. Tells me she takes 40 mg of Lasix daily 02/21/16; only 1 small wound remains on the lateral aspect of the right calf. She is using Juzo stockings on the left leg although she complains about difficulty in applying them. She has external compression pumps at home 02/28/16; the small open wound on her right lateral calf is improved in terms of wound area. It appears that she has a wrap injury on the anterior aspect of the upper leg 03/06/16; the small open wound on her right lateral calf has a very small open area remaining. The wrap injury on the anterior aspect of the upper leg also appears better. She arrives today in clinic with a history of dyspnea with minimal exertion starting with the last 2-4 days. She does not describe chest pain. Her son and our intake nurse think she has facial swelling. She has a history of an artificial mitral valve on Coumadin  03/13/16; the small wound on the right lateral calf is no better this week. The superior wrap injury anteriorly requires debridement of surface eschar and nonviable subcutaneous tissue. She has been to her primary doctor with regards to her dyspnea we identified last week. Per the [name]'s description her Lasix has been increased 03/21/2016 -- [name] of Dr. Dellia Nims who could not keep her appointment yesterday but has come in today with the right lower leg looking good and this is the leg which has been treated in the recent past.  However, her left lower extremity is extremely swollen, tender and edematous with redness and discoloration. 03/27/16; there is only 2 small areas remain on the right leg. The edema that was so concerning last week has come down however there is extensive bruising on the lateral left leg medial left foot suggesting that she lost some blood into the leg itself. I checked her hemoglobin on 9/21 was 14.5. Her INR was over 6. Duplex ultrasound was negative for DVT 04/03/16 at this point in time today [name] is actually doing substantially better in regard to the wound on the anterior right lower extremity. currently there is no slough or eschar noted and no evidence of erythema, discharge, or local infection. She is exhibiting no signs of systemic infection. She is tolerating the dressing changes as well as the wrapping at this point in time. 04/24/16; I have not seen Mrs. Virag for 3 weeks. Apparently she was admitted to hospital for respiratory issues/also apparently has had multiple compression fractures and had kyphoplasty. When she was last here she only had one small open area remaining on the left leg she was using juxtalite stockings. Her son is upset about restarting the Coumadin which she blames or the swelling in her legs. She is on Coumadin for prophylaxis with a chronic artificial heart valve. NIAH, HEINLE ([id number]) Electronic Signature(s) Signed: 04/24/2016 6:13:30 PM By: [name] MD Entered By: [name] on 04/24/2016 16:16:49 [name] ([id number]) -------------------------------------------------------------------------------- Physical Exam Details [name] Date of Service: 04/24/2016 3:00 PM [name] Name: [name] Account Number: [id number] Medical Record Treating RN: [name] [id number] Number: Other Clinician: 09-06-1934 (81 y.o. Treating Crockett Rallo Date of Birth/Sex: Female) Physician/Extender: G Primary  Care [name] Physician: Referring Physician: [name] in Treatment: 22 Constitutional Sitting or standing Blood Pressure is within target range for [name].. Pulse regular and within target range for [name].Marland Kitchen Respirations regular, non-labored and within target range.. Temperature is normal and within the target range for the [name].. [name] looks somewhat pale and dyspneic. Eyes Conjunctivae clear. No discharge.. Cardiovascular Mechanical first sound to it is 6 systolic ejection murmur. JVP is borderline elevated.. Pedal pulses palpable and strong bilaterally.. Edema present in both extremities. Severe bilateral hemosiderin deposition. Edema up into her thigh area. Lymphatic none palpable in the popliteal or inguianl area. Psychiatric [name] appears depressed today.. Notes Wound exam; she has 2 areas on on the left lateral leg just below the fibular head. This is superficial but with what appears to be Somes hematoma underneath this. She is a small area on the medial right leg. This is superficial. Both legs have very significant edema. She came into the clinic only in the stocking part of her juxtalite's with a short stocking underneath this. This will not hold this lady's swelling in her lower legs Electronic Signature(s) Signed: 04/24/2016 6:13:30 PM By: [name] MD Entered By: [name] on 04/24/2016 16:19:46 Attia, [name] ([id number]) -------------------------------------------------------------------------------- Physician Orders Details  [name] Date of Service: 04/24/2016 3:00 PM [name] Name: [name] Account Number: [id number] Medical Record Treating RN: [name] [id number] Number: Other Clinician: 07-21-1934 (81 y.o. Treating Shavell Nored, [place] Date of Birth/Sex: Female) Physician/Extender: G Primary Care [name] Physician: Referring Physician: [name] in Treatment: 106 Verbal / Phone Orders:  Yes Clinician: Carolyne Fiscal, Debi Read Back and Verified: Yes Diagnosis Coding Wound Cleansing Wound #6 Left,Lateral Knee o Clean wound with Normal Saline. o Cleanse wound with mild soap and water o No tub bath. - give sink or bed baths Wound #7 Right,Lateral Lower Leg o Clean wound with Normal Saline. o Cleanse wound with mild soap and water o No tub bath. - give sink or bed baths Wound #8 Right,Medial Lower Leg o Clean wound with Normal Saline. o Cleanse wound with mild soap and water o No tub bath. - give sink or bed baths Anesthetic Wound #6 Left,Lateral Knee o Topical Lidocaine 4% cream applied to wound bed prior to debridement - for clinic use Wound #7 Right,Lateral Lower Leg o Topical Lidocaine 4% cream applied to wound bed prior to debridement - for clinic use Wound #8 Right,Medial Lower Leg o Topical Lidocaine 4% cream applied to wound bed prior to debridement - for clinic use Primary Wound Dressing Wound #6 Left,Lateral Knee o Calcium Alginate Wound #7 Right,Lateral Lower Leg o Calcium Alginate Mccaig, Shamela C. ([id number]) Wound #8 Right,Medial Lower Leg o Calcium Alginate Secondary Dressing Wound #6 Left,Lateral Knee o ABD pad Wound #7 Right,Lateral Lower Leg o ABD pad Wound #8 Right,Medial Lower Leg o ABD pad Dressing Change Frequency Wound #6 Left,Lateral Knee o Other: - Twice a week Wednesday and Monday Pt will be seen in the [facility] on Wednesdays Wound #7 Right,Lateral Lower Leg o Other: - Twice a week Wednesday and Monday Pt will be seen in the [facility] on Wednesdays Wound #8 Right,Medial Lower Leg o Other: - Twice a week Wednesday and Monday Pt will be seen in the [facility] on Wednesdays Follow-up Appointments Wound #6 Left,Lateral Knee o Return Appointment in 1 week. Wound #7 Right,Lateral Lower Leg o Return Appointment in 1 week. Wound #8 Right,Medial Lower Leg o  Return Appointment in 1 week. Edema Control Wound #6 Left,Lateral Knee o 3 Layer Compression System - Bilateral - unna to anchor Wound #7 Right,Lateral Lower Leg o 3 Layer Compression System - Bilateral - unna to anchor Wound #8 Right,Medial Lower Leg o 3 Layer Compression System - Bilateral - unna to anchor Hawthorne, Delcenia C. ([id number]) Additional Orders / Instructions Wound #6 Left,Lateral Knee o Increase protein intake. Wound #7 Right,Lateral Lower Leg o Increase protein intake. Wound #8 Right,Medial Lower Leg o Increase protein intake. Electronic Signature(s) Signed: 04/24/2016 5:37:51 PM By: [name] Signed: 04/24/2016 6:13:30 PM By: [name] MD Entered By: [name] on 04/24/2016 16:06:37 Schreur, [name] ([id number]) -------------------------------------------------------------------------------- Problem List Details [name] Date of Service: 04/24/2016 3:00 PM [name] Name: [name] Account Number: [id number] Medical Record Treating RN: [name] [id number] Number: Other Clinician: Sep 01, 1934 (82 y.o. Treating Raynard Mapps Date of Birth/Sex: Female) Physician/Extender: G Primary Care [name] Physician: Referring Physician: [name] in Treatment: 22 Active Problems ICD-10 Encounter Code Description Active Date Diagnosis S81.812S Laceration without foreign body, left lower leg, sequela 04/03/2016 Yes I87.333 Chronic venous hypertension (idiopathic) with ulcer and 04/03/2016 Yes inflammation of bilateral lower extremity I89.0 Lymphedema, not elsewhere classified 04/03/2016 Yes K81.27 Chronic systolic (congestive) heart failure 04/03/2016 Yes Inactive  Problems Resolved Problems Electronic Signature(s) Signed: 04/24/2016 6:13:30 PM By: [name] MD Entered By: [name] on 04/24/2016 16:14:04 Manes, [name]  ([id number]) -------------------------------------------------------------------------------- Progress Note Details [name] Date of Service: 04/24/2016 3:00 PM [name] Name: [name] Account Number: [id number] Medical Record Treating RN: [name] [id number] Number: Other Clinician: 10-01-1934 (81 y.o. Treating Bernabe Dorce, [place] Date of Birth/Sex: Female) Physician/Extender: G Primary Care [name] Physician: Referring Physician: [name] in Treatment: 22 Subjective Chief Complaint Information obtained from [name] [name] here for reevaluation of her left lower extremity wound History of Present Illness (HPI) 11/22/15; this is Ivanoff is a 81 year old woman who lives at home on her own. According to the [name] and her daughter was present she has had long-standing edema in her legs dating back many years. She also has a history of chronic systolic heart failure, atrial fibrillation and is status post mitral valve replacement. Last echocardiogram I see in [facility] showed an ejection fraction of 40-45% she is on Lasix 60 mg a day and spironolactone 25 mg a day. Her current problem began in December around Christmas time she developed a small hematoma in the medial part of her left leg which rapidly expanded to a very large hematoma that required surgical debridement. This situation was complicated by the fact that the [name] is on long-standing Coumadin for mechanical heart valve. She went to the OR had this evacuated on January 4 /17. The wound has gradually improved however she has developed a small wounds around this area and more recently a wound on the right lateral leg. She is weeping edema fluid. The [name] is already been to see vascular surgery. It was recommended that she wear Unna boots, she did not tolerate this due to pain in the left leg. She was then prescribed Juzo stockings and really can't get these on herself although truthfully  there is probably too much edema for a Juzo stockings currently. She is not a diabetic and has no history of PAD or claudication that I could elicit. She does not use the external compression pumps reliably. She comes today with notes from her primary physician and Dr. Ronalee Belts both recommending various forms of compression but the [name] does not really complied with them. Has been using the external compression pumps with some regularity but certainly not daily on the right leg and this has helped. I also note that her daughter tells me the history that she does not sleep in bed at home. She has a hospital bed but with her legs up she finds this painful so she sleeps in the couch sitting up with her legs dependent. 11/29/15; the [name] is arrives today accompanied by her son. He expresses satisfaction that she is maintained the compression all week. 12/06/15; the [name] has kept her Profore light wraps on, we have good edema control no major change in the wounds we have been using Aquacel 12/13/15; changed to RTD last week. One of the 3 wounds on her left medial leg is healed she has 2 remaining wounds here and one on the right lateral leg. 12/18/2015 -- the [name] was at Dr. Nino Parsley office today and he was seeing her for an arterial study. While the wrap was being removed she had an inadvertent laceration of the left proximal anterior leg which bled quite profusely and a compression dressing was applied over this and the [name] was here to get her Profore wraps done. I was asked to see the [name] to make an  assessment and treat appropriately. TRACI, GAFFORD ([id number]) 12/26/15; the [name]'s injury on the left proximal leg and Steri-Strips removed after soaking. There is an open area here. The original wounds to still open on the right lateral and left medial leg. 01/03/16 [name]'s injury on her proximal left leg looks quite good. Still small open area on the medial left leg which  appears to be improving. The area on the right lateral leg still substantially open with no real improvement in wound depth. Her edema control is marginal with a Profore light. We have been using RTD for 3-4 weeks without any major change 01/10/16: wounds without s/s of infection. vascular results are pending regarding arterial studies. 01/17/16; [name] comes in today complaining of severe pain however I think most of this is in the right hip not related to her wounds. She continues with a oval-shaped wound on the right lateral leg, trauma to the left anterior leg just below her tibial plateau. She has a smaller eschar on the left anterior leg. She is being using Prisma however she informs [place] today that she is actually allergic to silver, nose this from a previous application at [facility] some years ago 01/24/16; edema is not so well controlled today, I think I reduced her to [place] bilaterally last week. The area which was a scissor injury on her left upper anterior lower leg is fully epithelialized. She only has a small open area remaining on the medial aspect of her left leg. The oval-shaped wound on the right mid lateral leg may be a bit smaller. Debrided of surface slough nonviable subcutaneous tissue. I had changed to collagen 2 weeks ago in an attempt to get this to close 01/31/16 all the [name]'s wounds on her left leg give healed. We have good edema control with bilateral Profore lites which she has been compliant with. She still has the oval-shaped wound on the right lateral calf allergic even this appears to be slowly improving. The [name] has juxtalite stockings at home. She states she thinks she can put these on. She also has external compression pumps at home although I think her compliance with this has not been good in the past. She complains today of edema in her thighs. Tells me she takes 40 mg of Lasix daily 02/21/16; only 1 small wound remains on the lateral aspect of the right  calf. She is using Juzo stockings on the left leg although she complains about difficulty in applying them. She has external compression pumps at home 02/28/16; the small open wound on her right lateral calf is improved in terms of wound area. It appears that she has a wrap injury on the anterior aspect of the upper leg 03/06/16; the small open wound on her right lateral calf has a very small open area remaining. The wrap injury on the anterior aspect of the upper leg also appears better. She arrives today in clinic with a history of dyspnea with minimal exertion starting with the last 2-4 days. She does not describe chest pain. Her son and our intake nurse think she has facial swelling. She has a history of an artificial mitral valve on Coumadin 03/13/16; the small wound on the right lateral calf is no better this week. The superior wrap injury anteriorly requires debridement of surface eschar and nonviable subcutaneous tissue. She has been to her primary doctor with regards to her dyspnea we identified last week. Per the [name]'s description her Lasix has been increased 03/21/2016 -- [name] of  Dr. Dellia Nims who could not keep her appointment yesterday but has come in today with the right lower leg looking good and this is the leg which has been treated in the recent past. However, her left lower extremity is extremely swollen, tender and edematous with redness and discoloration. 03/27/16; there is only 2 small areas remain on the right leg. The edema that was so concerning last week has come down however there is extensive bruising on the lateral left leg medial left foot suggesting that she lost some blood into the leg itself. I checked her hemoglobin on 9/21 was 14.5. Her INR was over 6. Duplex ultrasound was negative for DVT 04/03/16 at this point in time today [name] is actually doing substantially better in regard to the wound on the anterior right lower extremity. currently there is no slough  or eschar noted and no evidence of erythema, discharge, or local infection. She is exhibiting no signs of systemic infection. She is tolerating the dressing changes as well as the wrapping at this point in time. 04/24/16; I have not seen Mrs. Blok for 3 weeks. Apparently she was admitted to hospital for respiratory ALEASHA, FREGEAU. ([id number]) issues/also apparently has had multiple compression fractures and had kyphoplasty. When she was last here she only had one small open area remaining on the left leg she was using juxtalite stockings. Her son is upset about restarting the Coumadin which she blames or the swelling in her legs. She is on Coumadin for prophylaxis with a chronic artificial heart valve. Objective Constitutional Sitting or standing Blood Pressure is within target range for [name].. Pulse regular and within target range for [name].Marland Kitchen Respirations regular, non-labored and within target range.. Temperature is normal and within the target range for the [name].. [name] looks somewhat pale and dyspneic. Vitals Time Taken: 3:27 PM, Height: 68 in, Weight: 178 lbs, BMI: 27.1, Temperature: 97.7 F, Pulse: 56 bpm, Respiratory Rate: 16 breaths/min, Blood Pressure: 113/48 mmHg. General Notes: Made Dr. Dellia Nims aware of pt's BP. Eyes Conjunctivae clear. No discharge.. Cardiovascular Mechanical first sound to it is 6 systolic ejection murmur. JVP is borderline elevated.. Pedal pulses palpable and strong bilaterally.. Edema present in both extremities. Severe bilateral hemosiderin deposition. Edema up into her thigh area. Lymphatic none palpable in the popliteal or inguianl area. Psychiatric [name] appears depressed today.. General Notes: Wound exam; she has 2 areas on on the left lateral leg just below the fibular head. This is superficial but with what appears to be Somes hematoma underneath this. She is a small area on the medial right leg. This is superficial. Both legs  have very significant edema. She came into the clinic only in the stocking part of her juxtalite's with a short stocking underneath this. This will not hold this lady's swelling in her lower legs Integumentary (Hair, Skin) Wound #4 status is Healed - Epithelialized. Original cause of wound was Shear/Friction. The wound is located on the Right,Proximal Lower Leg. The wound measures 0cm length x 0cm width x 0cm depth; 0cm^2 area and 0cm^3 volume. There is no tunneling or undermining noted. There is a none present amount of drainage noted. There is no granulation within the wound bed. There is no necrotic tissue within the wound bed. INETHA, MARET C. ([id number]) Wound #6 status is Open. Original cause of wound was Trauma. The wound is located on the Left,Lateral Knee. The wound measures 1.6cm length x 3.1cm width x 0.1cm depth; 3.896cm^2 area and 0.39cm^3 volume. The wound is limited to skin  breakdown. There is no tunneling or undermining noted. There is a large amount of serosanguineous drainage noted. The wound margin is distinct with the outline attached to the wound base. There is no granulation within the wound bed. There is a large (67-100%) amount of necrotic tissue within the wound bed including Eschar. The periwound skin appearance exhibited: Localized Edema, Moist, Erythema. The surrounding wound skin color is noted with erythema which is circumferential. Periwound temperature was noted as No Abnormality. The periwound has tenderness on palpation. Wound #7 status is Open. Original cause of wound was Trauma. The wound is located on the Right,Lateral Lower Leg. The wound measures 0.1cm length x 0.1cm width x 0.1cm depth; 0.008cm^2 area and 0.001cm^3 volume. The wound is limited to skin breakdown. There is no tunneling or undermining noted. There is a small amount of serosanguineous drainage noted. The wound margin is distinct with the outline attached to the wound base. There is no  granulation within the wound bed. There is a large (67-100%) amount of necrotic tissue within the wound bed including Eschar. The periwound skin appearance exhibited: Localized Edema. Wound #8 status is Open. Original cause of wound was Trauma. The wound is located on the Right,Medial Lower Leg. The wound measures 0.4cm length x 0.7cm width x 0.1cm depth; 0.22cm^2 area and 0.022cm^3 volume. The wound is limited to skin breakdown. There is no tunneling or undermining noted. There is a medium amount of serosanguineous drainage noted. The wound margin is distinct with the outline attached to the wound base. There is no granulation within the wound bed. There is a large (67- 100%) amount of necrotic tissue within the wound bed including Adherent Slough. The periwound skin appearance exhibited: Localized Edema, Moist. Periwound temperature was noted as No Abnormality. The periwound has tenderness on palpation. Assessment Active Problems ICD-10 S81.812S - Laceration without foreign body, left lower leg, sequela I87.333 - Chronic venous hypertension (idiopathic) with ulcer and inflammation of bilateral lower extremity I89.0 - Lymphedema, not elsewhere classified H63.14 - Chronic systolic (congestive) heart failure Plan Wound Cleansing: Wound #6 Left,Lateral Knee: Clean wound with Normal Saline. Cleanse wound with mild soap and water Decou, Unique C. ([id number]) No tub bath. - give sink or bed baths Wound #7 Right,Lateral Lower Leg: Clean wound with Normal Saline. Cleanse wound with mild soap and water No tub bath. - give sink or bed baths Wound #8 Right,Medial Lower Leg: Clean wound with Normal Saline. Cleanse wound with mild soap and water No tub bath. - give sink or bed baths Anesthetic: Wound #6 Left,Lateral Knee: Topical Lidocaine 4% cream applied to wound bed prior to debridement - for clinic use Wound #7 Right,Lateral Lower Leg: Topical Lidocaine 4% cream applied to wound bed  prior to debridement - for clinic use Wound #8 Right,Medial Lower Leg: Topical Lidocaine 4% cream applied to wound bed prior to debridement - for clinic use Primary Wound Dressing: Wound #6 Left,Lateral Knee: Calcium Alginate Wound #7 Right,Lateral Lower Leg: Calcium Alginate Wound #8 Right,Medial Lower Leg: Calcium Alginate Secondary Dressing: Wound #6 Left,Lateral Knee: ABD pad Wound #7 Right,Lateral Lower Leg: ABD pad Wound #8 Right,Medial Lower Leg: ABD pad Dressing Change Frequency: Wound #6 Left,Lateral Knee: Other: - Twice a week Wednesday and Monday Pt will be seen in the [facility] on Wednesdays Wound #7 Right,Lateral Lower Leg: Other: - Twice a week Wednesday and Monday Pt will be seen in the [facility] on Wednesdays Wound #8 Right,Medial Lower Leg: Other: - Twice a week Wednesday  and Monday Pt will be seen in the [facility] on Wednesdays Follow-up Appointments: Wound #6 Left,Lateral Knee: Return Appointment in 1 week. Wound #7 Right,Lateral Lower Leg: Return Appointment in 1 week. Wound #8 Right,Medial Lower Leg: Return Appointment in 1 week. Edema Control: Wound #6 Left,Lateral Knee: 3 Layer Compression System - Bilateral - unna to anchor Wound #7 Right,Lateral Lower Leg: 3 Layer Compression System - Bilateral - unna to anchor Wound #8 Right,Medial Lower Leg: 3 Layer Compression System - Bilateral - unna to anchor Hickel, [place] C. ([id number]) Additional Orders / Instructions: Wound #6 Left,Lateral Knee: Increase protein intake. Wound #7 Right,Lateral Lower Leg: Increase protein intake. Wound #8 Right,Medial Lower Leg: Increase protein intake. #1 she is on 80 mg of Lasix a day, still shows signs of fluid volume overload #2 severe bilateral venous insufficiency she will need her legs wrapped to control the swelling before we consider putting her juxtalite stockings on properly #3 on Coumadin her mechanical mitral valve #4 although  she is on oxygen in complaining of dyspnea her lung sounds surprisingly clear #5 [name]'s wound will be just with calcium alginate [allergic to silver] and we will put her under Profore light which will have to be adjusted to cover the left upper leg wound Electronic Signature(s) Signed: 05/07/2016 1:18:17 PM By: Gretta Cool, RN, BSN, Kim RN, BSN Signed: 05/28/2016 7:50:59 AM By: [name] MD Previous Signature: 04/24/2016 6:13:30 PM Version By: [name] MD Entered By: Gretta Cool, RN, BSN, Kim on 05/07/2016 13:18:17 John, [name] ([id number]) -------------------------------------------------------------------------------- SuperBill Details [name] Name: [name] Date of Service: 04/24/2016 Medical Record [name] Account Number: [id number] Number: Treating RN: [name] February 05, 1935 (80 y.o. Other Clinician: Date of Birth/Sex: Female) Treating Hanifah Royse Primary Care Physician/Extender: Claudette Laws Physician: Suella Grove in Treatment: 22 Referring Physician: [name] Diagnosis Coding ICD-10 Codes Code Description [phone] Laceration without foreign body, left lower leg, sequela Chronic venous hypertension (idiopathic) with ulcer and inflammation of bilateral lower I87.333 extremity I89.0 Lymphedema, not elsewhere classified J88.41 Chronic systolic (congestive) heart failure Facility Procedures CPT4: Description Modifier Quantity Code [id number] BILATERAL: Application of multi-layer venous compression 1 system; leg (below knee), including ankle and foot. Physician Procedures CPT4: Description Modifier Quantity Code [id number] - WC PHYS LEVEL 4 - EST PT 1 ICD-10 Description Diagnosis S81.812S Laceration without foreign body, left lower leg, sequela I87.333 Chronic venous hypertension (idiopathic) with ulcer and  inflammation of bilateral lower extremity Electronic Signature(s) Signed: 04/24/2016 5:37:51 PM By: [name] Signed: 04/24/2016 6:13:30 PM By: [name] MD Entered By: [name] on 04/24/2016 16:47:31

## 2016-04-29 LAB — BASIC METABOLIC PANEL
Anion gap: 17 — ABNORMAL HIGH (ref 5–15)
BUN: 20 mg/dL (ref 6–20)
CALCIUM: 10.3 mg/dL (ref 8.9–10.3)
CO2: 29 mmol/L (ref 22–32)
CREATININE: 1.78 mg/dL — AB (ref 0.44–1.00)
Chloride: 82 mmol/L — ABNORMAL LOW (ref 101–111)
GFR calc non Af Amer: 26 mL/min — ABNORMAL LOW (ref >60)
GFR, EST AFRICAN AMERICAN: 30 mL/min — AB (ref >60)
GLUCOSE: 106 mg/dL — AB (ref 65–99)
Potassium: 3.3 mmol/L — ABNORMAL LOW (ref 3.5–5.1)
Sodium: 128 mmol/L — ABNORMAL LOW (ref 135–145)

## 2016-04-29 LAB — CBC WITH DIFFERENTIAL/PLATELET
BASOS PCT: 1 %
Basophils Absolute: 0.1 10*3/uL (ref 0–0.1)
Eosinophils Absolute: 0 10*3/uL (ref 0–0.7)
Eosinophils Relative: 1 %
HEMATOCRIT: 44.3 % (ref 35.0–47.0)
Hemoglobin: 14.5 g/dL (ref 12.0–16.0)
Lymphocytes Relative: 9 %
Lymphs Abs: 0.6 10*3/uL — ABNORMAL LOW (ref 1.0–3.6)
MCH: 26.8 pg (ref 26.0–34.0)
MCHC: 32.7 g/dL (ref 32.0–36.0)
MCV: 81.7 fL (ref 80.0–100.0)
MONO ABS: 0.7 10*3/uL (ref 0.2–0.9)
MONOS PCT: 11 %
NEUTROS ABS: 5.2 10*3/uL (ref 1.4–6.5)
Neutrophils Relative %: 78 %
Platelets: 261 10*3/uL (ref 150–440)
RBC: 5.42 MIL/uL — ABNORMAL HIGH (ref 3.80–5.20)
RDW: 20.7 % — AB (ref 11.5–14.5)
WBC: 6.6 10*3/uL (ref 3.6–11.0)

## 2016-04-29 LAB — PROTIME-INR
INR: 4.04
Prothrombin Time: 40.3 seconds — ABNORMAL HIGH (ref 11.4–15.2)

## 2016-04-30 ENCOUNTER — Encounter: Payer: Medicare Other | Admitting: Physician Assistant

## 2016-04-30 DIAGNOSIS — I87333 Chronic venous hypertension (idiopathic) with ulcer and inflammation of bilateral lower extremity: Secondary | ICD-10-CM | POA: Diagnosis not present

## 2016-05-01 ENCOUNTER — Non-Acute Institutional Stay (SKILLED_NURSING_FACILITY): Payer: Medicare Other | Admitting: Gerontology

## 2016-05-01 ENCOUNTER — Encounter
Admission: RE | Admit: 2016-05-01 | Discharge: 2016-05-01 | Disposition: A | Discharge status: Home / Self Care | Payer: Medicare Other | Source: Ambulatory Visit | Attending: Internal Medicine | Admitting: Internal Medicine

## 2016-05-01 DIAGNOSIS — R52 Pain, unspecified: Secondary | ICD-10-CM | POA: Diagnosis not present

## 2016-05-01 DIAGNOSIS — Z5181 Encounter for therapeutic drug level monitoring: Secondary | ICD-10-CM | POA: Diagnosis not present

## 2016-05-01 LAB — BASIC METABOLIC PANEL
Anion gap: 10 (ref 5–15)
BUN: 31 mg/dL — ABNORMAL HIGH (ref 6–20)
CALCIUM: 9 mg/dL (ref 8.9–10.3)
CHLORIDE: 95 mmol/L — AB (ref 101–111)
CO2: 27 mmol/L (ref 22–32)
CREATININE: 2.07 mg/dL — AB (ref 0.44–1.00)
GFR calc Af Amer: 25 mL/min — ABNORMAL LOW (ref >60)
GFR calc non Af Amer: 22 mL/min — ABNORMAL LOW (ref >60)
GLUCOSE: 95 mg/dL (ref 65–99)
Potassium: 3.8 mmol/L (ref 3.5–5.1)
Sodium: 132 mmol/L — ABNORMAL LOW (ref 135–145)

## 2016-05-01 LAB — CBC WITH DIFFERENTIAL/PLATELET
Basophils Absolute: 0.1 10*3/uL (ref 0–0.1)
Basophils Relative: 1 %
EOS ABS: 0 10*3/uL (ref 0–0.7)
EOS PCT: 1 %
HCT: 39.7 % (ref 35.0–47.0)
Hemoglobin: 12.9 g/dL (ref 12.0–16.0)
LYMPHS ABS: 0.7 10*3/uL — AB (ref 1.0–3.6)
Lymphocytes Relative: 12 %
MCH: 26.8 pg (ref 26.0–34.0)
MCHC: 32.4 g/dL (ref 32.0–36.0)
MCV: 82.6 fL (ref 80.0–100.0)
MONO ABS: 0.7 10*3/uL (ref 0.2–0.9)
MONOS PCT: 12 %
Neutro Abs: 4 10*3/uL (ref 1.4–6.5)
Neutrophils Relative %: 74 %
PLATELETS: 236 10*3/uL (ref 150–440)
RBC: 4.8 MIL/uL (ref 3.80–5.20)
RDW: 20.7 % — AB (ref 11.5–14.5)
WBC: 5.5 10*3/uL (ref 3.6–11.0)

## 2016-05-01 LAB — PROTIME-INR
INR: 4.92
PROTHROMBIN TIME: 47.2 s — AB (ref 11.4–15.2)

## 2016-05-01 NOTE — Progress Notes (Signed)
[name] ([id number]) Visit Report for 04/30/2016 Chief Complaint Document Details Patient Name: [name], [name] 04/30/2016 2:15 Date of Service: PM Medical Record [id number] Number: Patient Account Number: [id number] [birth year] (81 y.o. Treating RN: [name] Date of Birth/Sex: Female) Other Clinician: Primary Care Physician: [name] Treating [name] Referring Physician: [name] Physician/Extender: Suella Grove in Treatment: 22 Information Obtained from: Patient Chief Complaint Patient here for reevaluation of her bilateral lower extremity wounds Electronic Signature(s) Signed: 05/01/2016 1:37:38 AM By: Worthy Keeler [name] Entered By: Worthy Keeler on 04/30/2016 23:34:58 [name] ([id number]) -------------------------------------------------------------------------------- Debridement Details Patient Name: [name] 04/30/2016 2:15 Date of Service: PM Medical Record [id number] Number: Patient Account Number: [id number] 1935-06-25 (80 y.o. Treating RN: [name] Date of Birth/Sex: Female) Other Clinician: Primary Care Physician: [name] Treating Carrie III, Eyan Hagood Referring Physician: [name] Physician/Extender: Weeks in Treatment: 22 Debridement Performed for Wound #8 Right,Medial Lower Leg Assessment: Performed By: Physician [name] Debridement: Debridement Pre-procedure Yes - 14:50 Verification/Time Out Taken: Start Time: 14:51 Pain Control: Lidocaine 4% Topical Solution Level: Skin/Subcutaneous Tissue Total Area Debrided (L x 0.5 (cm) x 1 (cm) = 0.5 (cm) W): Tissue and other Viable, Non-Viable, Exudate, Fibrin/Slough, Subcutaneous material debrided: Instrument: Curette Bleeding: Minimum Hemostasis Achieved: Pressure End Time: 14:53 Procedural Pain: 0 Post Procedural Pain: 0 Response to Treatment: Procedure was tolerated well Post Debridement Measurements of Total  Wound Length: (cm) 0.5 Width: (cm) 1 Depth: (cm) 0.1 Volume: (cm) 0.039 Character of Wound/Ulcer Post Requires Further Debridement Debridement: Severity of Tissue Post Debridement: Fat layer exposed Post Procedure Diagnosis Same as Pre-procedure Electronic Signature(s) Signed: 04/30/2016 5:06:01 PM By: [name] Signed: 05/01/2016 1:37:38 AM By: Carrie Coyer, [name] ([id number]) Entered By: [name] on 04/30/2016 14:54:46 [name] ([id number]) -------------------------------------------------------------------------------- HPI Details Patient Name: [name] 04/30/2016 2:15 Date of Service: PM Medical Record [id number] Number: Patient Account Number: [id number] Mar 06, 1935 (81 y.o. Treating RN: [name] Date of Birth/Sex: Female) Other Clinician: Primary Care Physician: [name] Treating Carrie III, Nadene Witherspoon Referring Physician: [name] Physician/Extender: Suella Grove in Treatment: 22 History of Present Illness HPI Description: 11/22/15; this is Kakos is a 81 year old woman who lives at home on her own. According to the patient and her daughter was present she has had long-standing edema in her legs dating back many years. She also has a history of chronic systolic heart failure, atrial fibrillation and is status post mitral valve replacement. Last echocardiogram I see in [facility] showed an ejection fraction of 40-45% she is on Lasix 60 mg a day and spironolactone 25 mg a day. Her current problem began in December around Christmas time she developed a small hematoma in the medial part of her left leg which rapidly expanded to a very large hematoma that required surgical debridement. This situation was complicated by the fact that the patient is on long-standing Coumadin for mechanical heart valve. She went to the OR had this evacuated on January 4 /17. The wound has gradually improved however she has  developed a small wounds around this area and more recently a wound on the right lateral leg. She is weeping edema fluid. The patient is already been to see vascular surgery. It was recommended that she wear Unna boots, she did not tolerate this due to pain in the left leg. She was then prescribed Juzo stockings and really can't get these on herself although truthfully there is probably too much edema  for a Juzo stockings currently. She is not a diabetic and has no history of PAD or claudication that I could elicit. She does not use the external compression pumps reliably. She comes today with notes from her primary physician and Dr. Ronalee Belts both recommending various forms of compression but the patient does not really complied with them. Has been using the external compression pumps with some regularity but certainly not daily on the right leg and this has helped. I also note that her daughter tells me the history that she does not sleep in bed at home. She has a hospital bed but with her legs up she finds this painful so she sleeps in the couch sitting up with her legs dependent. 11/29/15; the patient is arrives today accompanied by her son. He expresses satisfaction that she is maintained the compression all week. 12/06/15; the patient has kept her Profore light wraps on, we have good edema control no major change in the wounds we have been using Aquacel 12/13/15; changed to RTD last week. One of the 3 wounds on her left medial leg is healed she has 2 remaining wounds here and one on the right lateral leg. 12/18/2015 -- the patient was at Dr. Nino Parsley office today and he was seeing her for an arterial study. While the wrap was being removed she had an inadvertent laceration of the left proximal anterior leg which bled quite profusely and a compression dressing was applied over this and the patient was here to get her Profore wraps done. I was asked to see the patient to make an assessment and  treat appropriately. 12/26/15; the patient's injury on the left proximal leg and Steri-Strips removed after soaking. There is an open area here. The original wounds to still open on the right lateral and left medial leg. 01/03/16 patient's injury on her proximal left leg looks quite good. Still small open area on the medial left leg which appears to be improving. The area on the right lateral leg still substantially open with no real improvement in wound depth. Her edema control is marginal with a Profore light. We have been using RTD for 3-4 weeks without any major change 01/10/16: wounds without s/s of infection. vascular results are pending regarding arterial studies. [name], [name] ([id number]) 01/17/16; patient comes in today complaining of severe pain however I think most of this is in the right hip not related to her wounds. She continues with a oval-shaped wound on the right lateral leg, trauma to the left anterior leg just below her tibial plateau. She has a smaller eschar on the left anterior leg. She is being using Prisma however she informs [place] today that she is actually allergic to silver, nose this from a previous application at [facility] some years ago 01/24/16; edema is not so well controlled today, I think I reduced her to [place] bilaterally last week. The area which was a scissor injury on her left upper anterior lower leg is fully epithelialized. She only has a small open area remaining on the medial aspect of her left leg. The oval-shaped wound on the right mid lateral leg may be a bit smaller. Debrided of surface slough nonviable subcutaneous tissue. I had changed to collagen 2 weeks ago in an attempt to get this to close 01/31/16 all the patient's wounds on her left leg give healed. We have good edema control with bilateral Profore lites which she has been compliant with. She still has the oval-shaped wound on the right  lateral calf allergic even this appears to be slowly  improving. The patient has juxtalite stockings at home. She states she thinks she can put these on. She also has external compression pumps at home although I think her compliance with this has not been good in the past. She complains today of edema in her thighs. Tells me she takes 40 mg of Lasix daily 02/21/16; only 1 small wound remains on the lateral aspect of the right calf. She is using Juzo stockings on the left leg although she complains about difficulty in applying them. She has external compression pumps at home 02/28/16; the small open wound on her right lateral calf is improved in terms of wound area. It appears that she has a wrap injury on the anterior aspect of the upper leg 03/06/16; the small open wound on her right lateral calf has a very small open area remaining. The wrap injury on the anterior aspect of the upper leg also appears better. She arrives today in clinic with a history of dyspnea with minimal exertion starting with the last 2-4 days. She does not describe chest pain. Her son and our intake nurse think she has facial swelling. She has a history of an artificial mitral valve on Coumadin 03/13/16; the small wound on the right lateral calf is no better this week. The superior wrap injury anteriorly requires debridement of surface eschar and nonviable subcutaneous tissue. She has been to her primary doctor with regards to her dyspnea we identified last week. Per the patient's description her Lasix has been increased 03/21/2016 -- patient of Dr. Dellia Nims who could not keep her appointment yesterday but has come in today with the right lower leg looking good and this is the leg which has been treated in the recent past. However, her left lower extremity is extremely swollen, tender and edematous with redness and discoloration. 03/27/16; there is only 2 small areas remain on the right leg. The edema that was so concerning last week has come down however there is extensive  bruising on the lateral left leg medial left foot suggesting that she lost some blood into the leg itself. I checked her hemoglobin on 9/21 was 14.5. Her INR was over 6. Duplex ultrasound was negative for DVT 04/03/16 at this point in time today patient is actually doing substantially better in regard to the wound on the anterior right lower extremity. currently there is no slough or eschar noted and no evidence of erythema, discharge, or local infection. She is exhibiting no signs of systemic infection. She is tolerating the dressing changes as well as the wrapping at this point in time. 04/24/16; I have not seen Mrs. Street for 3 weeks. Apparently she was admitted to hospital for respiratory issues/also apparently has had multiple compression fractures and had kyphoplasty. When she was last here she only had one small open area remaining on the left leg she was using juxtalite stockings. Her son is upset about restarting the Coumadin which she blames or the swelling in her legs. She is on Coumadin for prophylaxis with a chronic artificial heart valve. 04/30/16 at this point in time patient has been tolerating the 3 layer compression wrap as well as the calcium alginate. 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 With tthat being said she does have the [place], [place]. ([id number]) continued wound of the left lower extremity as well as the right medial lower extremity. The right side is significantly smaller compared to the left but actually is  slough covered. fortunately she has no significant tenderness at rest although with manipulation of the right location of the wound this is significantly tender. Electronic Signature(s) Signed: 05/01/2016 1:37:38 AM By: Worthy Keeler [name] Entered By: Worthy Keeler on 04/30/2016 23:36:15 [name], [name] ([id number]) -------------------------------------------------------------------------------- Physical Exam Details Patient Name: [name], [name] 04/30/2016 2:15 Date of Service: PM Medical Record [id number] Number: Patient Account Number: [id number] [birth year] (80 y.o. Treating RN: [name] Date of Birth/Sex: Female) Other Clinician: Primary Care Physician: [name] Treating Carrie III, Alasha Mcguinness Referring Physician: [name] Physician/Extender: Weeks in Treatment: 29 Constitutional Well-nourished and well-hydrated in no acute distress. Respiratory normal breathing without difficulty. Psychiatric this patient is able to make decisions and demonstrates good insight into disease process. Alert and Oriented x 3. pleasant and cooperative. Notes Patient's left lower extremity wound appears to be much clearer at this point in time without any significance slough, eschar, or exudate noted. No debridement was required in this regard. However her right lower extremity medially was slough covered and do require debridement today. Electronic Signature(s) Signed: 05/01/2016 1:37:38 AM By: Worthy Keeler [name] Entered By: Worthy Keeler on 04/30/2016 23:36:58 FLOYCE, BUJAK ([id number]) -------------------------------------------------------------------------------- Physician Orders Details Patient Name: [name], [name] 04/30/2016 2:15 Date of Service: PM Medical Record [id number] Number: Patient Account Number: [id number] [birth year] (80 y.o. Treating RN: [name] Date of Birth/Sex: Female) Other Clinician: Primary Care Physician: [name] Treating Carrie III, Kaylenn Civil Referring Physician: [name] Physician/Extender: Suella Grove in Treatment: 22 Verbal / Phone Orders: Yes Clinician: Carolyne Fiscal, Debi Read Back and Verified: Yes Diagnosis Coding Wound Cleansing Wound #6 Left,Lateral Knee o Clean wound with Normal Saline. o Cleanse wound with mild soap and water o No tub bath. - give sink or bed baths Wound #8 Right,Medial Lower Leg o Clean wound with Normal Saline. o Cleanse  wound with mild soap and water o No tub bath. - give sink or bed baths Anesthetic Wound #6 Left,Lateral Knee o Topical Lidocaine 4% cream applied to wound bed prior to debridement - for clinic use Wound #8 Right,Medial Lower Leg o Topical Lidocaine 4% cream applied to wound bed prior to debridement - for clinic use Primary Wound Dressing Wound #6 Left,Lateral Knee o Calcium Alginate Wound #8 Right,Medial Lower Leg o Calcium Alginate Secondary Dressing Wound #6 Left,Lateral Knee o Dry Gauze Wound #8 Right,Medial Lower Leg o Dry Gauze Dressing Change Frequency Wound #6 Left,Lateral Knee Grunow, Ahri C. ([id number]) o Change dressing every week Wound #8 Right,Medial Lower Leg o Change dressing every week Follow-up Appointments Wound #6 Left,Lateral Knee o Return Appointment in 1 week. Wound #8 Right,Medial Lower Leg o Return Appointment in 1 week. Edema Control Wound #6 Left,Lateral Knee o 3 Layer Compression System - Bilateral Wound #8 Right,Medial Lower Leg o 3 Layer Compression System - Bilateral Additional Orders / Instructions Wound #6 Left,Lateral Knee o Increase protein intake. Wound #8 Right,Medial Lower Leg o Increase protein intake. Electronic Signature(s) Signed: 04/30/2016 5:06:01 PM By: [name] Signed: 05/01/2016 1:37:38 AM By: Worthy Keeler [name] Entered By: [name] on 04/30/2016 15:11:06 [name] ([id number]) -------------------------------------------------------------------------------- Problem List Details Patient Name: [name], [name] 04/30/2016 2:15 Date of Service: PM Medical Record [id number] Number: Patient Account Number: [id number] 18-Aug-1934 (81 y.o. Treating RN: [name] Date of Birth/Sex: Female) Other Clinician: Primary Care Physician: [name] Treating Melburn Hake, Nicki Furlan Referring Physician: [name] Physician/Extender: Suella Grove in Treatment: 22 Active  Problems ICD-10 Encounter Code Description Active Date Diagnosis  X54.008Q Laceration without foreign body, left lower leg, sequela 04/03/2016 Yes I87.333 Chronic venous hypertension (idiopathic) with ulcer and 04/03/2016 Yes inflammation of bilateral lower extremity L97.812 Non-pressure chronic ulcer of other part of right lower leg 04/30/2016 Yes with fat layer exposed I89.0 Lymphedema, not elsewhere classified 04/03/2016 Yes P61.95 Chronic systolic (congestive) heart failure 04/03/2016 Yes Inactive Problems Resolved Problems Electronic Signature(s) Signed: 05/01/2016 1:37:38 AM By: Worthy Keeler [name] Entered By: Worthy Keeler on 04/30/2016 23:38:10 Vohs, [name] ([id number]) -------------------------------------------------------------------------------- Progress Note Details Patient Name: [name] 04/30/2016 2:15 Date of Service: PM Medical Record [id number] Number: Patient Account Number: [id number] 06-02-1935 (80 y.o. Treating RN: [name] Date of Birth/Sex: Female) Other Clinician: Primary Care Physician: [name] Treating Carrie III, Vuong Musa Referring Physician: [name] Physician/Extender: Suella Grove in Treatment: 22 Subjective Chief Complaint Information obtained from Patient Patient here for reevaluation of her bilateral lower extremity wounds History of Present Illness (HPI) 11/22/15; this is Ruppel is a 81 year old woman who lives at home on her own. According to the patient and her daughter was present she has had long-standing edema in her legs dating back many years. She also has a history of chronic systolic heart failure, atrial fibrillation and is status post mitral valve replacement. Last echocardiogram I see in [facility] showed an ejection fraction of 40-45% she is on Lasix 60 mg a day and spironolactone 25 mg a day. Her current problem began in December around Christmas time she developed a small hematoma in the medial part  of her left leg which rapidly expanded to a very large hematoma that required surgical debridement. This situation was complicated by the fact that the patient is on long-standing Coumadin for mechanical heart valve. She went to the OR had this evacuated on January 4 /17. The wound has gradually improved however she has developed a small wounds around this area and more recently a wound on the right lateral leg. She is weeping edema fluid. The patient is already been to see vascular surgery. It was recommended that she wear Unna boots, she did not tolerate this due to pain in the left leg. She was then prescribed Juzo stockings and really can't get these on herself although truthfully there is probably too much edema for a Juzo stockings currently. She is not a diabetic and has no history of PAD or claudication that I could elicit. She does not use the external compression pumps reliably. She comes today with notes from her primary physician and Dr. Ronalee Belts both recommending various forms of compression but the patient does not really complied with them. Has been using the external compression pumps with some regularity but certainly not daily on the right leg and this has helped. I also note that her daughter tells me the history that she does not sleep in bed at home. She has a hospital bed but with her legs up she finds this painful so she sleeps in the couch sitting up with her legs dependent. 11/29/15; the patient is arrives today accompanied by her son. He expresses satisfaction that she is maintained the compression all week. 12/06/15; the patient has kept her Profore light wraps on, we have good edema control no major change in the wounds we have been using Aquacel 12/13/15; changed to RTD last week. One of the 3 wounds on her left medial leg is healed she has 2 remaining wounds here and one on the right lateral leg. 12/18/2015 -- the patient was at Dr. Nino Parsley office  today and he was  seeing her for an arterial study. While the wrap was being removed she had an inadvertent laceration of the left proximal anterior leg which bled quite profusely and a compression dressing was applied over this and the patient was here to get her Profore wraps done. I was asked to see the patient to make an assessment and treat appropriately. 12/26/15; the patient's injury on the left proximal leg and Steri-Strips removed after soaking. There is an open area here. The original wounds to still open on the right lateral and left medial leg. KELI, BUEHNER ([id number]) 01/03/16 patient's injury on her proximal left leg looks quite good. Still small open area on the medial left leg which appears to be improving. The area on the right lateral leg still substantially open with no real improvement in wound depth. Her edema control is marginal with a Profore light. We have been using RTD for 3-4 weeks without any major change 01/10/16: wounds without s/s of infection. vascular results are pending regarding arterial studies. 01/17/16; patient comes in today complaining of severe pain however I think most of this is in the right hip not related to her wounds. She continues with a oval-shaped wound on the right lateral leg, trauma to the left anterior leg just below her tibial plateau. She has a smaller eschar on the left anterior leg. She is being using Prisma however she informs [place] today that she is actually allergic to silver, nose this from a previous application at [facility] some years ago 01/24/16; edema is not so well controlled today, I think I reduced her to [place] bilaterally last week. The area which was a scissor injury on her left upper anterior lower leg is fully epithelialized. She only has a small open area remaining on the medial aspect of her left leg. The oval-shaped wound on the right mid lateral leg may be a bit smaller. Debrided of surface slough nonviable subcutaneous tissue. I had  changed to collagen 2 weeks ago in an attempt to get this to close 01/31/16 all the patient's wounds on her left leg give healed. We have good edema control with bilateral Profore lites which she has been compliant with. She still has the oval-shaped wound on the right lateral calf allergic even this appears to be slowly improving. The patient has juxtalite stockings at home. She states she thinks she can put these on. She also has external compression pumps at home although I think her compliance with this has not been good in the past. She complains today of edema in her thighs. Tells me she takes 40 mg of Lasix daily 02/21/16; only 1 small wound remains on the lateral aspect of the right calf. She is using Juzo stockings on the left leg although she complains about difficulty in applying them. She has external compression pumps at home 02/28/16; the small open wound on her right lateral calf is improved in terms of wound area. It appears that she has a wrap injury on the anterior aspect of the upper leg 03/06/16; the small open wound on her right lateral calf has a very small open area remaining. The wrap injury on the anterior aspect of the upper leg also appears better. She arrives today in clinic with a history of dyspnea with minimal exertion starting with the last 2-4 days. She does not describe chest pain. Her son and our intake nurse think she has facial swelling. She has a history of an artificial mitral  valve on Coumadin 03/13/16; the small wound on the right lateral calf is no better this week. The superior wrap injury anteriorly requires debridement of surface eschar and nonviable subcutaneous tissue. She has been to her primary doctor with regards to her dyspnea we identified last week. Per the patient's description her Lasix has been increased 03/21/2016 -- patient of Dr. Dellia Nims who could not keep her appointment yesterday but has come in today with the right lower leg looking good  and this is the leg which has been treated in the recent past. However, her left lower extremity is extremely swollen, tender and edematous with redness and discoloration. 03/27/16; there is only 2 small areas remain on the right leg. The edema that was so concerning last week has come down however there is extensive bruising on the lateral left leg medial left foot suggesting that she lost some blood into the leg itself. I checked her hemoglobin on 9/21 was 14.5. Her INR was over 6. Duplex ultrasound was negative for DVT 04/03/16 at this point in time today patient is actually doing substantially better in regard to the wound on the anterior right lower extremity. currently there is no slough or eschar noted and no evidence of erythema, discharge, or local infection. She is exhibiting no signs of systemic infection. She is tolerating the dressing changes as well as the wrapping at this point in time. 04/24/16; I have not seen Mrs. Eckroth for 3 weeks. Apparently she was admitted to hospital for respiratory issues/also apparently has had multiple compression fractures and had kyphoplasty. When she was last here she only had one small open area remaining on the left leg she was using juxtalite stockings. Her son [name], [name] ([id number]) is upset about restarting the Coumadin which she blames or the swelling in her legs. She is on Coumadin for prophylaxis with a chronic artificial heart valve. 04/30/16 at this point in time patient has been tolerating the 3 layer compression wrap as well as the calcium alginate. 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 With tthat being said she does have the continued wound of the left lower extremity as well as the right medial lower extremity. The right side is significantly smaller compared to the left but actually is slough covered. fortunately she has no significant tenderness at rest although with manipulation of the right location of the wound  this is significantly tender. Objective Constitutional Well-nourished and well-hydrated in no acute distress. Vitals Time Taken: 2:12 PM, Height: 68 in, Weight: 178 lbs, BMI: 27.1, Temperature: 97.8 F, Pulse: 61 bpm, Respiratory Rate: 16 breaths/min, Blood Pressure: 108/63 mmHg, Pulse Oximetry: 96 %. Respiratory normal breathing without difficulty. Psychiatric this patient is able to make decisions and demonstrates good insight into disease process. Alert and Oriented x 3. pleasant and cooperative. General Notes: Patient's left lower extremity wound appears to be much clearer at this point in time without any significance slough, eschar, or exudate noted. No debridement was required in this regard. However her right lower extremity medially was slough covered and do require debridement today. Integumentary (Hair, Skin) Wound #6 status is Open. Original cause of wound was Trauma. The wound is located on the Left,Lateral Knee. The wound measures 2cm length x 2.7cm width x 0.1cm depth; 4.241cm^2 area and 0.424cm^3 volume. The wound is limited to skin breakdown. There is no tunneling or undermining noted. There is a large amount of serosanguineous drainage noted. The wound margin is distinct with the outline attached to the wound  base. There is large (67-100%) red, pink granulation within the wound bed. There is a small (1-33%) amount of necrotic tissue within the wound bed including Eschar and Adherent Slough. The periwound skin appearance exhibited: Localized Edema, Moist, Erythema. The surrounding wound skin color is noted with erythema which is circumferential. Periwound temperature was noted as No Abnormality. The periwound has tenderness on palpation. Wound #7 status is Open. Original cause of wound was Trauma. The wound is located on the Right,Lateral Lower Leg. The wound measures 0cm length x 0cm width x 0cm depth; 0cm^2 area and 0cm^3 volume. The wound is limited to skin breakdown.  There is no tunneling or undermining noted. There is a none present amount of drainage noted. The wound margin is distinct with the outline attached to the wound [name], [name] ([id number]) base. There is no granulation within the wound bed. There is no necrotic tissue within the wound bed. The periwound skin appearance did not exhibit: Localized Edema. Periwound temperature was noted as No Abnormality. The periwound has tenderness on palpation. Wound #8 status is Open. Original cause of wound was Trauma. The wound is located on the Right,Medial Lower Leg. The wound measures 0.5cm length x 1cm width x 0.1cm depth; 0.393cm^2 area and 0.039cm^3 volume. The wound is limited to skin breakdown. There is no tunneling or undermining noted. There is a large amount of serosanguineous drainage noted. The wound margin is distinct with the outline attached to the wound base. There is no granulation within the wound bed. There is a large (67-100%) amount of necrotic tissue within the wound bed including Eschar and Adherent Slough. The periwound skin appearance exhibited: Localized Edema, Moist. Periwound temperature was noted as No Abnormality. The periwound has tenderness on palpation. Assessment Active Problems ICD-10 S81.812S - Laceration without foreign body, left lower leg, sequela I87.333 - Chronic venous hypertension (idiopathic) with ulcer and inflammation of bilateral lower extremity L97.812 - Non-pressure chronic ulcer of other part of right lower leg with fat layer exposed I89.0 - Lymphedema, not elsewhere classified R15.40 - Chronic systolic (congestive) heart failure Diagnoses ICD-10 S81.812S: Laceration without foreign body, left lower leg, sequela I87.333: Chronic venous hypertension (idiopathic) with ulcer and inflammation of bilateral lower extremity L97.812: Non-pressure chronic ulcer of other part of right lower leg with fat layer exposed I89.0: Lymphedema, not elsewhere  classified G86.76: Chronic systolic (congestive) heart failure Procedures Wound #8 Wound #8 is a Trauma, Other located on the Right,Medial Lower Leg . There was a Skin/Subcutaneous Tissue Debridement ([id number]) debridement with total area of 0.5 sq cm performed by Carrie III, Rodger Giangregorio E., [name]. with the following instrument(s): Curette to remove Viable and Non-Viable tissue/material including Exudate, Fibrin/Slough, and Subcutaneous after achieving pain control using Lidocaine 4% Topical Solution. A time out was conducted at 14:50, prior to the start of the procedure. A Minimum amount of bleeding was controlled with Pressure. The procedure was tolerated well with a pain level of 0 throughout and a pain level of 0 following the procedure. Post Debridement Measurements: 0.5cm length x 1cm width x [name], [name] ([id number]) 0.1cm depth; 0.039cm^3 volume. Character of Wound/Ulcer Post Debridement requires further debridement. Severity of Tissue Post Debridement is: Fat layer exposed. Post procedure Diagnosis Wound #8: Same as Pre-Procedure Plan Wound Cleansing: Wound #6 Left,Lateral Knee: Clean wound with Normal Saline. Cleanse wound with mild soap and water No tub bath. - give sink or bed baths Wound #8 Right,Medial Lower Leg: Clean wound with Normal Saline. Cleanse wound with mild soap and  water No tub bath. - give sink or bed baths Anesthetic: Wound #6 Left,Lateral Knee: Topical Lidocaine 4% cream applied to wound bed prior to debridement - for clinic use Wound #8 Right,Medial Lower Leg: Topical Lidocaine 4% cream applied to wound bed prior to debridement - for clinic use Primary Wound Dressing: Wound #6 Left,Lateral Knee: Calcium Alginate Wound #8 Right,Medial Lower Leg: Calcium Alginate Secondary Dressing: Wound #6 Left,Lateral Knee: Dry Gauze Wound #8 Right,Medial Lower Leg: Dry Gauze Dressing Change Frequency: Wound #6 Left,Lateral Knee: Change dressing every  week Wound #8 Right,Medial Lower Leg: Change dressing every week Follow-up Appointments: Wound #6 Left,Lateral Knee: Return Appointment in 1 week. Wound #8 Right,Medial Lower Leg: Return Appointment in 1 week. Edema Control: Wound #6 Left,Lateral Knee: 3 Layer Compression System - Bilateral Wound #8 Right,Medial Lower Leg: [name], [name] ([id number]) 3 Layer Compression System - Bilateral Additional Orders / Instructions: Wound #6 Left,Lateral Knee: Increase protein intake. Wound #8 Right,Medial Lower Leg: Increase protein intake. Follow-Up Appointments: A follow-up appointment should be scheduled. Medication Reconciliation completed and provided to Patient/Care Provider. A Patient Clinical Summary of Care was provided to MS At this point in time I'm recommending that we continue with the calcium alginate in regard to both wounds. We will also continue with the 3 layer compression wrap which she has been tolerating bilaterally. We will see her back for follow-up visit in one week to see where things stand at that point in time. All questionns were encouraged and answered to the best of my ability during the office visit today. If she has any other concerns in the meantime she will contact the office for further direction. Electronic Signature(s) Signed: 05/01/2016 1:37:38 AM By: Worthy Keeler [name] Entered By: Worthy Keeler on 04/30/2016 23:39:25 [name], [name] ([id number]) -------------------------------------------------------------------------------- SuperBill Details Patient Name: [name]. Date of Service: 04/30/2016 Medical Record Number: [id number] Patient Account Number: [id number] Date of Birth/Sex: 1935-04-28 (80 y.o. Female) Treating RN: Carolyne Fiscal, Debi Primary Care Physician: [name] Other Clinician: Referring Physician: [name] Treating Physician/Extender: Melburn Hake, Letty Salvi Weeks in Treatment: 22 Diagnosis Coding ICD-10  Codes Code Description [id number] Laceration without foreign body, left lower leg, sequela Chronic venous hypertension (idiopathic) with ulcer and inflammation of bilateral lower I87.333 extremity L97.812 Non-pressure chronic ulcer of other part of right lower leg with fat layer exposed I89.0 Lymphedema, not elsewhere classified X21.19 Chronic systolic (congestive) heart failure Facility Procedures CPT4: Description Modifier Quantity Code [id number] 11042 - DEB SUBQ TISSUE 20 SQ CM/< 1 ICD-10 Description Diagnosis L97.812 Non-pressure chronic ulcer of other part of right lower leg with fat layer exposed Physician Procedures CPT4: Description Modifier Quantity Code [id number] 14970 - WC PHYS SUBQ TISS 20 SQ CM 1 ICD-10 Description Diagnosis L97.812 Non-pressure chronic ulcer of other part of right lower leg with fat layer exposed Electronic Signature(s) Signed: 05/01/2016 1:37:38 AM By: Worthy Keeler [name] Entered By: Worthy Keeler on 04/30/2016 23:38:24

## 2016-05-01 NOTE — Progress Notes (Signed)
ZABDI, MIS ([id number]) Visit Report for 04/30/2016 Arrival Information Details Patient Name: [name], [name]. Date of Service: 04/30/2016 2:15 PM Medical Record Number: [id number] Patient Account Number: [id number] Date of Birth/Sex: [birth year] (80 y.o. Female) Treating RN: [name] Primary Care Physician: [name] Other Clinician: Referring Physician: [name] Treating Physician/Extender: Melburn Hake, HOYT Weeks in Treatment: 22 Visit Information History Since Last Visit All ordered tests and consults were completed: No Patient Arrived: Wheel Chair Added or deleted any medications: No Arrival Time: 14:09 Any new allergies or adverse reactions: No Accompanied By: daughter Had a fall or experienced change in No Transfer Assistance: None activities of daily living that may affect Patient Identification Verified: Yes risk of falls: Secondary Verification Process Yes Signs or symptoms of abuse/neglect since last No Completed: visito Patient Requires Transmission- No Hospitalized since last visit: No Based Precautions: Pain Present Now: No Patient Has Alerts: Yes Patient Alerts: Patient on Blood Thinner [place] Signature(s) Signed: 04/30/2016 5:06:01 PM By: [name] Entered By: [name] on 04/30/2016 14:12:23 Tabron, [name] ([id number]) -------------------------------------------------------------------------------- Encounter Discharge Information Details Patient Name: Carrie Mulligan C. Date of Service: 04/30/2016 2:15 PM Medical Record Number: [id number] Patient Account Number: [id number] Date of Birth/Sex: Jul 19, 1934 (80 y.o. Female) Treating RN: [name] Primary Care Physician: [name] Other Clinician: Referring Physician: [name] Treating Physician/Extender: Melburn Hake, HOYT Weeks in Treatment: 22 Encounter Discharge Information Items Discharge Pain Level:  0 Discharge Condition: Stable Ambulatory Status: Wheelchair Discharge Destination: Nursing Home Transportation: Private Auto Accompanied By: daughter Schedule Follow-up Appointment: Yes Medication Reconciliation completed and provided to Patient/Care Yes Jewell Haught: Provided on Clinical Summary of Care: 04/30/2016 Form Type Recipient Paper Patient [place] Electronic Signature(s) Signed: 04/30/2016 3:14:21 PM By: Ruthine Dose Entered By: Ruthine Dose on 04/30/2016 15:14:20 Staat, [name] ([id number]) -------------------------------------------------------------------------------- Lower Extremity Assessment Details Patient Name: Carrie Mulligan C. Date of Service: 04/30/2016 2:15 PM Medical Record Number: [id number] Patient Account Number: [id number] Date of Birth/Sex: 1935/04/05 (80 y.o. Female) Treating RN: [name] Primary Care Physician: [name] Other Clinician: Referring Physician: [name] Treating Physician/Extender: Melburn Hake, HOYT Weeks in Treatment: 22 Vascular Assessment Pulses: Posterior Tibial Dorsalis Pedis Palpable: [Left:Yes] [Right:Yes] Extremity colors, hair growth, and conditions: Extremity Color: [Left:Mottled] [Right:Mottled] Temperature of Extremity: [Left:Warm] [Right:Warm] Capillary Refill: [Left:< 3 seconds] [Right:< 3 seconds] Toe Nail Assessment Left: Right: Thick: Yes Yes Discolored: Yes Yes Deformed: No No Improper Length and Hygiene: No No Electronic Signature(s) Signed: 04/30/2016 5:06:01 PM By: [name] Entered By: [name] on 04/30/2016 14:17:11 Hammond, Fortune CMarland Kitchen ([id number]) -------------------------------------------------------------------------------- Multi Wound Chart Details Patient Name: Carrie Mulligan C. Date of Service: 04/30/2016 2:15 PM Medical Record Number: [id number] Patient Account Number: [id number] Date of Birth/Sex: 01-22-1935 (80 y.o. Female) Treating RN: [name] Primary Care Physician: [name] Other Clinician: Referring Physician: [name] Treating Physician/Extender: Melburn Hake, HOYT Weeks in Treatment: 22 Vital Signs Height(in): 68 Pulse(bpm): 61 Weight(lbs): 178 Blood Pressure 108/63 (mmHg): Body Mass Index(BMI): 27 Temperature(F): 97.8 Respiratory Rate 16 (breaths/min): Photos: [6:No Photos] [7:No Photos] [8:No Photos] Wound Location: [6:Left Knee - Lateral] [7:Right Lower Leg - Lateral] [8:Right Lower Leg - Medial] Wounding Event: [6:Trauma] [7:Trauma] [8:Trauma] Primary Etiology: [6:Trauma, Other] [7:Trauma, Other] [8:Trauma, Other] Comorbid History: [6:Cataracts, Arrhythmia, Hypotension, Osteoarthritis] [7:Cataracts, Arrhythmia, Hypotension, Osteoarthritis] [8:Cataracts, Arrhythmia, Hypotension, Osteoarthritis] Date Acquired: [6:04/06/2016] [7:04/06/2016] [8:04/06/2016] Weeks of Treatment: [6:0] [7:0] [8:0] Wound Status: [6:Open] [7:Open] [8:Open] Measurements L x W x D 2x2.7x0.1 [7:0x0x0] [8:0.5x1x0.1] (cm) Area (cm) : [6:4.241] [7:0] [8:0.393] Volume (  cm) : [6:0.424] [7:0] [8:0.039] % Reduction in Area: [6:-8.90%] [7:100.00%] [8:-78.60%] % Reduction in Volume: -8.70% [7:100.00%] [8:-77.30%] Classification: [6:Partial Thickness] [7:Partial Thickness] [8:Partial Thickness] Exudate Amount: [6:Large] [7:None Present] [8:Large] Exudate Type: [6:Serosanguineous] [7:N/A] [8:Serosanguineous] Exudate Color: [6:red, brown] [7:N/A] [8:red, brown] Wound Margin: [6:Distinct, outline attached] [7:Distinct, outline attached] [8:Distinct, outline attached] Granulation Amount: [6:Large (67-100%)] [7:None Present (0%)] [8:None Present (0%)] Granulation Quality: [6:Red, Pink] [7:N/A] [8:N/A] Necrotic Amount: [6:Small (1-33%)] [7:None Present (0%)] [8:Large (67-100%)] Necrotic Tissue: [6:Eschar, Adherent Slough] [7:N/A] [8:Eschar, Adherent Slough] Exposed Structures: [6:Fascia: No Fat: No Tendon: No Muscle: No Joint: No]  [7:Fascia: No Fat: No Tendon: No Muscle: No Joint: No] [8:Fascia: No Fat: No Tendon: No Muscle: No Joint: No] Bone: No Bone: No Bone: No Limited to Skin Limited to Skin Limited to Skin Breakdown Breakdown Breakdown Epithelialization: None Large (67-100%) None Periwound Skin Texture: Edema: Yes Edema: No Edema: Yes Periwound Skin Moist: Yes No Abnormalities Noted Moist: Yes Moisture: Periwound Skin Color: Erythema: Yes No Abnormalities Noted No Abnormalities Noted Erythema Location: Circumferential N/A N/A Temperature: No Abnormality No Abnormality No Abnormality Tenderness on Yes Yes Yes Palpation: Wound Preparation: Ulcer Cleansing: Other: Ulcer Cleansing: Other: Ulcer Cleansing: Other: soap and water soap and water soap and water Topical Anesthetic Topical Anesthetic Topical Anesthetic Applied: Other: lidocaine Applied: None, Other: Applied: Other: lidocaine 4% lidocaine 4% 4% Treatment Notes Electronic Signature(s) Signed: 04/30/2016 5:06:01 PM By: [name] Entered By: [name] on 04/30/2016 14:36:23 Raymond, Shauntel Loletha Grayer ([id number]) -------------------------------------------------------------------------------- Multi-Disciplinary Care Plan Details Patient Name: [name] Date of Service: 04/30/2016 2:15 PM Medical Record Number: [id number] Patient Account Number: [id number] Date of Birth/Sex: [birth year] (80 y.o. Female) Treating RN: [name] Primary Care Physician: [name] Other Clinician: Referring Physician: [name] Treating Physician/Extender: Melburn Hake, HOYT Weeks in Treatment: 22 Active Inactive Orientation to the Wound Care Program Nursing Diagnoses: Knowledge deficit related to the wound healing center program Goals: Patient/caregiver will verbalize understanding of the [facility] Date Initiated: 11/22/2015 Goal Status: Active Interventions: Provide education on orientation to the wound  center Notes: Venous Leg Ulcer Nursing Diagnoses: Knowledge deficit related to disease process and management Potential for venous Insuffiency (use before diagnosis confirmed) Goals: Patient will maintain optimal edema control Date Initiated: 11/22/2015 Goal Status: Active Patient/caregiver will verbalize understanding of disease process and disease management Date Initiated: 11/22/2015 Goal Status: Active Verify adequate tissue perfusion prior to therapeutic compression application Date Initiated: 11/22/2015 Goal Status: Active Interventions: Assess peripheral edema status every visit. Compression as ordered Provide education on venous insufficiency CLARIS, PECH ([id number]) Treatment Activities: Non-invasive vascular studies : 11/22/2015 Therapeutic compression applied : 11/22/2015 Notes: Wound/Skin Impairment Nursing Diagnoses: Impaired tissue integrity Knowledge deficit related to ulceration/compromised skin integrity Goals: Patient/caregiver will verbalize understanding of skin care regimen Date Initiated: 11/22/2015 Goal Status: Active Ulcer/skin breakdown will have a volume reduction of 30% by week 4 Date Initiated: 11/22/2015 Goal Status: Active Ulcer/skin breakdown will have a volume reduction of 50% by week 8 Date Initiated: 11/22/2015 Goal Status: Active Ulcer/skin breakdown will have a volume reduction of 80% by week 12 Date Initiated: 11/22/2015 Goal Status: Active Ulcer/skin breakdown will heal within 14 weeks Date Initiated: 11/22/2015 Goal Status: Active Interventions: Assess patient/caregiver ability to perform ulcer/skin care regimen upon admission and as needed Assess ulceration(s) every visit Provide education on ulcer and skin care Treatment Activities: Skin care regimen initiated : 11/22/2015 Topical wound management initiated : 11/22/2015 Notes: Electronic Signature(s) Signed: 04/30/2016 5:06:01 PM By: [name] Entered By:  [name] on 04/30/2016 14:35:27 Conrey, ILENA DIECKMAN ([id number]) [address], [place] Loletha Grayer ([id number]) -------------------------------------------------------------------------------- Pain Assessment Details Patient Name: HUNTLEY, DEMEDEIROS. Date of Service: 04/30/2016 2:15 PM Medical Record Number: [id number] Patient Account Number: [id number] Date of Birth/Sex: March 14, 1935 (80 y.o. Female) Treating RN: [name] Primary Care Physician: [name] Other Clinician: Referring Physician: [name] Treating Physician/Extender: Melburn Hake, HOYT Weeks in Treatment: 22 Active Problems Location of Pain Severity and Description of Pain Patient Has Paino No Site Locations With Dressing Change: No Pain Management and Medication Current Pain Management: Electronic Signature(s) Signed: 04/30/2016 5:06:01 PM By: [name] Entered By: [name] on 04/30/2016 14:12:29 Rollyson, [name] ([id number]) -------------------------------------------------------------------------------- Patient/Caregiver Education Details Patient Name: [name] Date of Service: 04/30/2016 2:15 PM Medical Record Number: [id number] Patient Account Number: [id number] Date of Birth/Gender: 1934/10/30 (81 y.o. Female) Treating RN: [name] Primary Care Physician: [name] Other Clinician: Referring Physician: [name] Treating Physician/Extender: Melburn Hake, HOYT Weeks in Treatment: 22 Education Assessment Education Provided To: Patient and Caregiver daughter Education Topics Provided Wound/Skin Impairment: Handouts: Other: change dressing as ordered and do not get dressing wet Methods: Demonstration, Explain/Verbal Responses: State content correctly Electronic Signature(s) Signed: 04/30/2016 5:06:01 PM By: [name] Entered By: [name] on 04/30/2016 14:38:15 Elsea, Tahj C.  ([id number]) -------------------------------------------------------------------------------- Wound Assessment Details Patient Name: [name], Nathalie C. Date of Service: 04/30/2016 2:15 PM Medical Record Number: [id number] Patient Account Number: [id number] Date of Birth/Sex: 07/28/1934 (80 y.o. Female) Treating RN: Carolyne Fiscal, Debi Primary Care Physician: [name] Other Clinician: Referring Physician: [name] Treating Physician/Extender: Melburn Hake, HOYT Weeks in Treatment: 22 Wound Status Wound Number: 6 Primary Trauma, Other Etiology: Wound Location: Left Knee - Lateral Wound Status: Open Wounding Event: Trauma Comorbid Cataracts, Arrhythmia, Hypotension, Date Acquired: 04/06/2016 History: Osteoarthritis Weeks Of Treatment: 0 Clustered Wound: No Photos Photo Uploaded By: [name] on 04/30/2016 14:40:52 Wound Measurements Length: (cm) 2 Width: (cm) 2.7 Depth: (cm) 0.1 Area: (cm) 4.241 Volume: (cm) 0.424 % Reduction in Area: -8.9% % Reduction in Volume: -8.7% Epithelialization: None Tunneling: No Undermining: No Wound Description Classification: Partial Thickness Wound Margin: Distinct, outline attached Exudate Amount: Large Exudate Type: Serosanguineous Exudate Color: red, brown Foul Odor After Cleansing: No Wound Bed Granulation Amount: Large (67-100%) Exposed Structure Granulation Quality: Red, Pink Fascia Exposed: No Necrotic Amount: Small (1-33%) Fat Layer Exposed: No Necrotic Quality: Eschar, Adherent Slough Tendon Exposed: No Siek, Kyarra C. ([id number]) Muscle Exposed: No Joint Exposed: No Bone Exposed: No Limited to Skin Breakdown Periwound Skin Texture Texture Color No Abnormalities Noted: No No Abnormalities Noted: No Localized Edema: Yes Erythema: Yes Erythema Location: Circumferential Moisture No Abnormalities Noted: No Temperature / Pain Moist: Yes Temperature: No Abnormality Tenderness on Palpation: Yes Wound  Preparation Ulcer Cleansing: Other: soap and water, Topical Anesthetic Applied: Other: lidocaine 4%, Treatment Notes Wound #6 (Left, Lateral Knee) 1. Cleansed with: Clean wound with Normal Saline Cleanse wound with antibacterial soap and water 2. Anesthetic Topical Lidocaine 4% cream to wound bed prior to debridement 4. Dressing Applied: Calcium Alginate 5. Secondary Dressing Applied Dry Gauze 7. Secured with 3 Layer Compression System - Bilateral Electronic Signature(s) Signed: 04/30/2016 5:06:01 PM By: [name] Entered By: [name] on 04/30/2016 14:33:30 Baldinger, [name] ([id number]) -------------------------------------------------------------------------------- Wound Assessment Details Patient Name: [name], Muriah C. Date of Service: 04/30/2016 2:15 PM Medical Record Number: [id number] Patient Account Number: [id number] Date of Birth/Sex: [birth year] (80 y.o. Female) Treating RN: Carolyne Fiscal, Debi Primary Care Physician: [name] Other Clinician: Referring Physician: Sabra Heck,  Mark Treating Physician/Extender: STONE III, HOYT Weeks in Treatment: 22 Wound Status Wound Number: 7 Primary Trauma, Other Etiology: Wound Location: Right Lower Leg - Lateral Wound Status: Open Wounding Event: Trauma Comorbid Cataracts, Arrhythmia, Hypotension, Date Acquired: 04/06/2016 History: Osteoarthritis Weeks Of Treatment: 0 Clustered Wound: No Photos Photo Uploaded By: [name] on 04/30/2016 14:41:11 Wound Measurements Length: (cm) Width: (cm) Depth: (cm) Area: (cm) Volume: (cm) 0 % Reduction in Area: 100% 0 % Reduction in Volume: 100% 0 Epithelialization: Large (67-100%) 0 Tunneling: No 0 Undermining: No Wound Description Classification: Partial Thickness Wound Margin: Distinct, outline attached Exudate Amount: None Present Foul Odor After Cleansing: No Wound Bed Granulation Amount: None Present (0%) Exposed Structure Necrotic Amount:  None Present (0%) Fascia Exposed: No Fat Layer Exposed: No Tendon Exposed: No Muscle Exposed: No Joint Exposed: No Bonfiglio, Dorla C. ([id number]) Bone Exposed: No Limited to Skin Breakdown Periwound Skin Texture Texture Color No Abnormalities Noted: No No Abnormalities Noted: No Localized Edema: No Temperature / Pain Moisture Temperature: No Abnormality No Abnormalities Noted: No Tenderness on Palpation: Yes Wound Preparation Ulcer Cleansing: Other: soap and water, Topical Anesthetic Applied: None, Other: lidocaine 4%, Electronic Signature(s) Signed: 04/30/2016 5:06:01 PM By: [name] Entered By: [name] on 04/30/2016 14:32:32 Wubben, Chamara Loletha Grayer ([id number]) -------------------------------------------------------------------------------- Wound Assessment Details Patient Name: [name], Claudette C. Date of Service: 04/30/2016 2:15 PM Medical Record Number: [id number] Patient Account Number: [id number] Date of Birth/Sex: 03-25-1935 (80 y.o. Female) Treating RN: Carolyne Fiscal, Debi Primary Care Physician: [name] Other Clinician: Referring Physician: [name] Treating Physician/Extender: Melburn Hake, HOYT Weeks in Treatment: 22 Wound Status Wound Number: 8 Primary Trauma, Other Etiology: Wound Location: Right Lower Leg - Medial Wound Status: Open Wounding Event: Trauma Comorbid Cataracts, Arrhythmia, Hypotension, Date Acquired: 04/06/2016 History: Osteoarthritis Weeks Of Treatment: 0 Clustered Wound: No Photos Photo Uploaded By: [name] on 04/30/2016 14:41:32 Wound Measurements Length: (cm) 0.5 Width: (cm) 1 Depth: (cm) 0.1 Area: (cm) 0.393 Volume: (cm) 0.039 % Reduction in Area: -78.6% % Reduction in Volume: -77.3% Epithelialization: None Tunneling: No Undermining: No Wound Description Classification: Partial Thickness Wound Margin: Distinct, outline attached Exudate Amount: Large Exudate Type: Serosanguineous Exudate  Color: red, brown Foul Odor After Cleansing: No Wound Bed Granulation Amount: None Present (0%) Exposed Structure Necrotic Amount: Large (67-100%) Fascia Exposed: No Necrotic Quality: Eschar, Adherent Slough Fat Layer Exposed: No Tendon Exposed: No Sahakian, Azlynn C. ([id number]) Muscle Exposed: No Joint Exposed: No Bone Exposed: No Limited to Skin Breakdown Periwound Skin Texture Texture Color No Abnormalities Noted: No No Abnormalities Noted: No Localized Edema: Yes Temperature / Pain Moisture Temperature: No Abnormality No Abnormalities Noted: No Tenderness on Palpation: Yes Moist: Yes Wound Preparation Ulcer Cleansing: Other: soap and water, Topical Anesthetic Applied: Other: lidocaine 4%, Treatment Notes Wound #8 (Right, Medial Lower Leg) 1. Cleansed with: Clean wound with Normal Saline Cleanse wound with antibacterial soap and water 2. Anesthetic Topical Lidocaine 4% cream to wound bed prior to debridement 4. Dressing Applied: Calcium Alginate 5. Secondary Dressing Applied Dry Gauze 7. Secured with 3 Layer Compression System - Bilateral Electronic Signature(s) Signed: 04/30/2016 5:06:01 PM By: [name] Entered By: [name] on 04/30/2016 14:34:06 Whetstine, [name] ([id number]) -------------------------------------------------------------------------------- Vitals Details Patient Name: Carrie Mulligan C. Date of Service: 04/30/2016 2:15 PM Medical Record Number: [id number] Patient Account Number: [id number] Date of Birth/Sex: 1934/08/29 (80 y.o. Female) Treating RN: [name] Primary Care Physician: [name] Other Clinician: Referring Physician: [name] Treating Physician/Extender: STONE III, HOYT Weeks in Treatment: 22 Vital Signs  Time Taken: 14:12 Temperature (F): 97.8 Height (in): 68 Pulse (bpm): 61 Weight (lbs): 178 Respiratory Rate (breaths/min): 16 Body Mass Index (BMI): 27.1 Blood Pressure (mmHg):  108/63 Reference Range: 80 - 120 mg / dl Pulse Oximetry (%): 96 Electronic Signature(s) Signed: 04/30/2016 5:06:01 PM By: [name] Entered By: [name] on 04/30/2016 14:16:11

## 2016-05-02 ENCOUNTER — Other Ambulatory Visit (HOSPITAL_COMMUNITY): Payer: Self-pay | Admitting: Interventional Radiology

## 2016-05-02 DIAGNOSIS — IMO0001 Reserved for inherently not codable concepts without codable children: Secondary | ICD-10-CM

## 2016-05-02 DIAGNOSIS — M4850XS Collapsed vertebra, not elsewhere classified, site unspecified, sequela of fracture: Principal | ICD-10-CM

## 2016-05-02 LAB — COMPREHENSIVE METABOLIC PANEL
ALBUMIN: 3.8 g/dL (ref 3.5–5.0)
ALK PHOS: 184 U/L — AB (ref 38–126)
ALT: 15 U/L (ref 14–54)
AST: 20 U/L (ref 15–41)
Anion gap: 12 (ref 5–15)
BUN: 32 mg/dL — AB (ref 6–20)
CALCIUM: 9.2 mg/dL (ref 8.9–10.3)
CO2: 28 mmol/L (ref 22–32)
CREATININE: 1.81 mg/dL — AB (ref 0.44–1.00)
Chloride: 95 mmol/L — ABNORMAL LOW (ref 101–111)
GFR calc Af Amer: 30 mL/min — ABNORMAL LOW (ref >60)
GFR calc non Af Amer: 26 mL/min — ABNORMAL LOW (ref >60)
GLUCOSE: 78 mg/dL (ref 65–99)
Potassium: 3.5 mmol/L (ref 3.5–5.1)
SODIUM: 135 mmol/L (ref 135–145)
Total Bilirubin: 0.9 mg/dL (ref 0.3–1.2)
Total Protein: 7.1 g/dL (ref 6.5–8.1)

## 2016-05-02 LAB — CBC WITH DIFFERENTIAL/PLATELET
BASOS ABS: 0.1 10*3/uL (ref 0–0.1)
Basophils Relative: 2 %
Eosinophils Absolute: 0.1 10*3/uL (ref 0–0.7)
Eosinophils Relative: 2 %
HEMATOCRIT: 39.6 % (ref 35.0–47.0)
Hemoglobin: 13.2 g/dL (ref 12.0–16.0)
LYMPHS ABS: 0.6 10*3/uL — AB (ref 1.0–3.6)
LYMPHS PCT: 12 %
MCH: 27.3 pg (ref 26.0–34.0)
MCHC: 33.4 g/dL (ref 32.0–36.0)
MCV: 81.8 fL (ref 80.0–100.0)
MONO ABS: 0.6 10*3/uL (ref 0.2–0.9)
Monocytes Relative: 12 %
NEUTROS ABS: 3.4 10*3/uL (ref 1.4–6.5)
Neutrophils Relative %: 72 %
Platelets: 220 10*3/uL (ref 150–440)
RBC: 4.84 MIL/uL (ref 3.80–5.20)
RDW: 20 % — ABNORMAL HIGH (ref 11.5–14.5)
WBC: 4.8 10*3/uL (ref 3.6–11.0)

## 2016-05-02 LAB — PROTIME-INR
INR: 4.19
Prothrombin Time: 41.5 seconds — ABNORMAL HIGH (ref 11.4–15.2)

## 2016-05-03 LAB — CBC WITH DIFFERENTIAL/PLATELET
BASOS ABS: 0.1 10*3/uL (ref 0–0.1)
Basophils Relative: 1 %
EOS PCT: 1 %
Eosinophils Absolute: 0.1 10*3/uL (ref 0–0.7)
HCT: 41.4 % (ref 35.0–47.0)
Hemoglobin: 13.5 g/dL (ref 12.0–16.0)
LYMPHS PCT: 10 %
Lymphs Abs: 0.6 10*3/uL — ABNORMAL LOW (ref 1.0–3.6)
MCH: 27 pg (ref 26.0–34.0)
MCHC: 32.6 g/dL (ref 32.0–36.0)
MCV: 83.1 fL (ref 80.0–100.0)
MONO ABS: 0.5 10*3/uL (ref 0.2–0.9)
Monocytes Relative: 8 %
Neutro Abs: 4.4 10*3/uL (ref 1.4–6.5)
Neutrophils Relative %: 80 %
PLATELETS: 200 10*3/uL (ref 150–440)
RBC: 4.98 MIL/uL (ref 3.80–5.20)
RDW: 19.9 % — AB (ref 11.5–14.5)
WBC: 5.6 10*3/uL (ref 3.6–11.0)

## 2016-05-03 LAB — COMPREHENSIVE METABOLIC PANEL
ALT: 14 U/L (ref 14–54)
AST: 21 U/L (ref 15–41)
Albumin: 3.9 g/dL (ref 3.5–5.0)
Alkaline Phosphatase: 173 U/L — ABNORMAL HIGH (ref 38–126)
Anion gap: 10 (ref 5–15)
BUN: 31 mg/dL — ABNORMAL HIGH (ref 6–20)
CHLORIDE: 97 mmol/L — AB (ref 101–111)
CO2: 29 mmol/L (ref 22–32)
Calcium: 9.4 mg/dL (ref 8.9–10.3)
Creatinine, Ser: 1.81 mg/dL — ABNORMAL HIGH (ref 0.44–1.00)
GFR, EST AFRICAN AMERICAN: 30 mL/min — AB (ref >60)
GFR, EST NON AFRICAN AMERICAN: 26 mL/min — AB (ref >60)
Glucose, Bld: 154 mg/dL — ABNORMAL HIGH (ref 65–99)
POTASSIUM: 3.4 mmol/L — AB (ref 3.5–5.1)
Sodium: 136 mmol/L (ref 135–145)
Total Bilirubin: 0.8 mg/dL (ref 0.3–1.2)
Total Protein: 7.2 g/dL (ref 6.5–8.1)

## 2016-05-03 LAB — PROTIME-INR
INR: 2.66
PROTHROMBIN TIME: 28.9 s — AB (ref 11.4–15.2)

## 2016-05-06 LAB — COMPREHENSIVE METABOLIC PANEL
ALT: 17 U/L (ref 14–54)
ANION GAP: 15 (ref 5–15)
AST: 23 U/L (ref 15–41)
Albumin: 4.3 g/dL (ref 3.5–5.0)
Alkaline Phosphatase: 203 U/L — ABNORMAL HIGH (ref 38–126)
BUN: 47 mg/dL — ABNORMAL HIGH (ref 6–20)
CALCIUM: 10.3 mg/dL (ref 8.9–10.3)
CHLORIDE: 86 mmol/L — AB (ref 101–111)
CO2: 29 mmol/L (ref 22–32)
Creatinine, Ser: 2.37 mg/dL — ABNORMAL HIGH (ref 0.44–1.00)
GFR calc non Af Amer: 19 mL/min — ABNORMAL LOW (ref >60)
GFR, EST AFRICAN AMERICAN: 21 mL/min — AB (ref >60)
Glucose, Bld: 111 mg/dL — ABNORMAL HIGH (ref 65–99)
POTASSIUM: 4.4 mmol/L (ref 3.5–5.1)
SODIUM: 130 mmol/L — AB (ref 135–145)
Total Bilirubin: 1 mg/dL (ref 0.3–1.2)
Total Protein: 7.8 g/dL (ref 6.5–8.1)

## 2016-05-06 LAB — CBC WITH DIFFERENTIAL/PLATELET
Basophils Absolute: 0.1 10*3/uL (ref 0–0.1)
Basophils Relative: 1 %
EOS ABS: 0 10*3/uL (ref 0–0.7)
EOS PCT: 0 %
HCT: 47.1 % — ABNORMAL HIGH (ref 35.0–47.0)
Hemoglobin: 15.5 g/dL (ref 12.0–16.0)
LYMPHS ABS: 0.7 10*3/uL — AB (ref 1.0–3.6)
Lymphocytes Relative: 10 %
MCH: 27 pg (ref 26.0–34.0)
MCHC: 32.9 g/dL (ref 32.0–36.0)
MCV: 82 fL (ref 80.0–100.0)
MONOS PCT: 11 %
Monocytes Absolute: 0.8 10*3/uL (ref 0.2–0.9)
Neutro Abs: 6 10*3/uL (ref 1.4–6.5)
Neutrophils Relative %: 78 %
PLATELETS: 289 10*3/uL (ref 150–440)
RBC: 5.74 MIL/uL — ABNORMAL HIGH (ref 3.80–5.20)
RDW: 19.2 % — AB (ref 11.5–14.5)
WBC: 7.6 10*3/uL (ref 3.6–11.0)

## 2016-05-06 LAB — PROTIME-INR
INR: 3.8
Prothrombin Time: 38.4 seconds — ABNORMAL HIGH (ref 11.4–15.2)

## 2016-05-07 ENCOUNTER — Encounter: Payer: No Typology Code available for payment source | Attending: Physician Assistant | Admitting: Physician Assistant

## 2016-05-07 DIAGNOSIS — X58XXXS Exposure to other specified factors, sequela: Secondary | ICD-10-CM | POA: Insufficient documentation

## 2016-05-07 DIAGNOSIS — I87333 Chronic venous hypertension (idiopathic) with ulcer and inflammation of bilateral lower extremity: Secondary | ICD-10-CM | POA: Insufficient documentation

## 2016-05-07 DIAGNOSIS — Z7901 Long term (current) use of anticoagulants: Secondary | ICD-10-CM | POA: Insufficient documentation

## 2016-05-07 DIAGNOSIS — I5022 Chronic systolic (congestive) heart failure: Secondary | ICD-10-CM | POA: Insufficient documentation

## 2016-05-07 DIAGNOSIS — S81812S Laceration without foreign body, left lower leg, sequela: Secondary | ICD-10-CM | POA: Insufficient documentation

## 2016-05-07 DIAGNOSIS — Z952 Presence of prosthetic heart valve: Secondary | ICD-10-CM | POA: Insufficient documentation

## 2016-05-07 DIAGNOSIS — L97812 Non-pressure chronic ulcer of other part of right lower leg with fat layer exposed: Secondary | ICD-10-CM | POA: Insufficient documentation

## 2016-05-07 DIAGNOSIS — I890 Lymphedema, not elsewhere classified: Secondary | ICD-10-CM | POA: Insufficient documentation

## 2016-05-07 DIAGNOSIS — Z79899 Other long term (current) drug therapy: Secondary | ICD-10-CM | POA: Insufficient documentation

## 2016-05-07 DIAGNOSIS — I4891 Unspecified atrial fibrillation: Secondary | ICD-10-CM | POA: Insufficient documentation

## 2016-05-08 NOTE — Progress Notes (Addendum)
[name], [name] ([id number]) Visit Report for 05/07/2016 Chief Complaint Document Details Patient Name: [name], [name] 05/07/2016 1:30 Date of Service: PM Medical Record [id number] Number: Patient Account Number: [id number] [birth year] (81 y.o. Treating RN: [name] Date of Birth/Sex: Female) Other Clinician: Primary Care Physician: [name] Treating STONE III, HOYT Referring Physician: [name] Physician/Extender: Suella Grove in Treatment: 23 Information Obtained from: Patient Chief Complaint Patient here for reevaluation of her bilateral lower extremity wounds Electronic Signature(s) Signed: 05/08/2016 8:57:57 PM By: Worthy Keeler PA-C Entered By: Worthy Keeler on 05/08/2016 20:51:43 Pollett, [name] ([id number]) -------------------------------------------------------------------------------- HPI Details Patient Name: [name], [name] 05/07/2016 1:30 Date of Service: PM Medical Record [id number] Number: Patient Account Number: [id number] Sep 24, 1934 (80 y.o. Treating RN: [name] Date of Birth/Sex: Female) Other Clinician: Primary Care Physician: [name] Treating STONE III, HOYT Referring Physician: [name] Physician/Extender: Suella Grove in Treatment: 23 History of Present Illness HPI Description: 11/22/15; this is Kehm is a 81 year old woman who lives at home on her own. According to the patient and her daughter was present she has had long-standing edema in her legs dating back many years. She also has a history of chronic systolic heart failure, atrial fibrillation and is status post mitral valve replacement. Last echocardiogram I see [facility] showed an ejection fraction of 40-45% she is on Lasix 60 mg a day and spironolactone 25 mg a day. Her current problem began in December around Christmas time she developed a small hematoma in the medial part of her left leg which rapidly expanded to a very large hematoma that  required surgical debridement. This situation was complicated by the fact that the patient is on long-standing Coumadin for mechanical heart valve. She went to the OR had this evacuated on January 4 /17. The wound has gradually improved however she has developed a small wounds around this area and more recently a wound on the right lateral leg. She is weeping edema fluid. The patient is already been to see vascular surgery. It was recommended that she wear Unna boots, she did not tolerate this due to pain in the left leg. She was then prescribed Juzo stockings and really can't get these on herself although truthfully there is probably too much edema for a Juzo stockings currently. She is not a diabetic and has no history of PAD or claudication that I could elicit. She does not use the external compression pumps reliably. She comes today with notes from her primary physician and Dr. Ronalee Belts both recommending various forms of compression but the patient does not really complied with them. Has been using the external compression pumps with some regularity but certainly not daily on the right leg and this has helped. I also note that her daughter tells me the history that she does not sleep in bed at home. She has a hospital bed but with her legs up she finds this painful so she sleeps in the couch sitting up with her legs dependent. 11/29/15; the patient is arrives today accompanied by her son. He expresses satisfaction that she is maintained the compression all week. 12/06/15; the patient has kept her Profore light wraps on, we have good edema control no major change in the wounds we have been using Aquacel 12/13/15; changed to RTD last week. One of the 3 wounds on her left medial leg is healed she has 2 remaining wounds here and one on the right lateral leg. 12/18/2015 -- the patient was at Dr. Nino Parsley office today  and he was seeing her for an arterial study. While the wrap was being removed she  had an inadvertent laceration of the left proximal anterior leg which bled quite profusely and a compression dressing was applied over this and the patient was here to get her Profore wraps done. I was asked to see the patient to make an assessment and treat appropriately. 12/26/15; the patient's injury on the left proximal leg and Steri-Strips removed after soaking. There is an open area here. The original wounds to still open on the right lateral and left medial leg. 01/03/16 patient's injury on her proximal left leg looks quite good. Still small open area on the medial left leg which appears to be improving. The area on the right lateral leg still substantially open with no real improvement in wound depth. Her edema control is marginal with a Profore light. We have been using RTD for 3-4 weeks without any major change 01/10/16: wounds without s/s of infection. vascular results are pending regarding arterial studies. [name], [name] ([id number]) 01/17/16; patient comes in today complaining of severe pain however I think most of this is in the right hip not related to her wounds. She continues with a oval-shaped wound on the right lateral leg, trauma to the left anterior leg just below her tibial plateau. She has a smaller eschar on the left anterior leg. She is being using Prisma however she informs [place] today that she is actually allergic to silver, nose this from a previous application at [facility] some years ago 01/24/16; edema is not so well controlled today, I think I reduced her to [place] bilaterally last week. The area which was a scissor injury on her left upper anterior lower leg is fully epithelialized. She only has a small open area remaining on the medial aspect of her left leg. The oval-shaped wound on the right mid lateral leg may be a bit smaller. Debrided of surface slough nonviable subcutaneous tissue. I had changed to collagen 2 weeks ago in an attempt to get this to  close 01/31/16 all the patient's wounds on her left leg give healed. We have good edema control with bilateral Profore lites which she has been compliant with. She still has the oval-shaped wound on the right lateral calf allergic even this appears to be slowly improving. The patient has juxtalite stockings at home. She states she thinks she can put these on. She also has external compression pumps at home although I think her compliance with this has not been good in the past. She complains today of edema in her thighs. Tells me she takes 40 mg of Lasix daily 02/21/16; only 1 small wound remains on the lateral aspect of the right calf. She is using Juzo stockings on the left leg although she complains about difficulty in applying them. She has external compression pumps at home 02/28/16; the small open wound on her right lateral calf is improved in terms of wound area. It appears that she has a wrap injury on the anterior aspect of the upper leg 03/06/16; the small open wound on her right lateral calf has a very small open area remaining. The wrap injury on the anterior aspect of the upper leg also appears better. She arrives today in clinic with a history of dyspnea with minimal exertion starting with the last 2-4 days. She does not describe chest pain. Her son and our intake nurse think she has facial swelling. She has a history of an artificial mitral valve  on Coumadin 03/13/16; the small wound on the right lateral calf is no better this week. The superior wrap injury anteriorly requires debridement of surface eschar and nonviable subcutaneous tissue. She has been to her primary doctor with regards to her dyspnea we identified last week. Per the patient's description her Lasix has been increased 03/21/2016 -- patient of Dr. Dellia Nims who could not keep her appointment yesterday but has come in today with the right lower leg looking good and this is the leg which has been treated in the recent past.  However, her left lower extremity is extremely swollen, tender and edematous with redness and discoloration. 03/27/16; there is only 2 small areas remain on the right leg. The edema that was so concerning last week has come down however there is extensive bruising on the lateral left leg medial left foot suggesting that she lost some blood into the leg itself. I checked her hemoglobin on 9/21 was 14.5. Her INR was over 6. Duplex ultrasound was negative for DVT 04/03/16 at this point in time today patient is actually doing substantially better in regard to the wound on the anterior right lower extremity. currently there is no slough or eschar noted and no evidence of erythema, discharge, or local infection. She is exhibiting no signs of systemic infection. She is tolerating the dressing changes as well as the wrapping at this point in time. 04/24/16; I have not seen Mrs. Tieken for 3 weeks. Apparently she was admitted to hospital for respiratory issues/also apparently has had multiple compression fractures and had kyphoplasty. When she was last here she only had one small open area remaining on the left leg she was using juxtalite stockings. Her son is upset about restarting the Coumadin which she blames or the swelling in her legs. She is on Coumadin for prophylaxis with a chronic artificial heart valve. 04/30/16 at this point in time patient has been tolerating the 3 layer compression wrap as well as the calcium alginate. 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 With tthat being said she does have the [place], [place]. ([id number]) continued wound of the left lower extremity as well as the right medial lower extremity. The right side is significantly smaller compared to the left but actually is slough covered. fortunately she has no significant tenderness at rest although with manipulation of the right location of the wound this is significantly tender. 05/07/16 for follow-up evaluation  today both the patient's wounds appear to be significantly improved in size compared to last week. She is also having less pain at both locations currently. Her pain level at most is related to be a 1-2 out of 10. She does have some discharge but fortunately no evidence of infection at this point.she also continues to tolerate the compression wraps well. Electronic Signature(s) Signed: 05/08/2016 8:57:57 PM By: Worthy Keeler PA-C Entered By: Worthy Keeler on 05/08/2016 20:54:29 [name], [name] ([id number]) -------------------------------------------------------------------------------- Physical Exam Details Patient Name: [name], [name] 05/07/2016 1:30 Date of Service: PM Medical Record [id number] Number: Patient Account Number: [id number] February 24, 1935 (80 y.o. Treating RN: [name] Date of Birth/Sex: Female) Other Clinician: Primary Care Physician: [name] Treating STONE III, HOYT Referring Physician: [name] Physician/Extender: Weeks in Treatment: 72 Constitutional Well-nourished and well-hydrated in no acute distress. Respiratory normal breathing without difficulty. clear to auscultation bilaterally. Cardiovascular regular rate and rhythm with normal S1, S2. 1+ bilateral pitting edema. Psychiatric this patient is able to make decisions and demonstrates good insight into disease process.  Alert and Oriented x 3. pleasant and cooperative. Notes Again on evaluation today the right medial lower extremity wound with slough covered and required debridement. The left appears to continue to do well and the wound bed was very clean. Neither showed sign of infection. Electronic Signature(s) Signed: 05/08/2016 8:57:57 PM By: Worthy Keeler PA-C Entered By: Worthy Keeler on 05/08/2016 20:55:30 Snuffer, [name] ([id number]) -------------------------------------------------------------------------------- Physician Orders Details Patient Name: [name], [name] 05/07/2016 1:30 Date of Service: PM Medical Record [id number] Number: Patient Account Number: [id number] Nov 26, 1934 (80 y.o. Treating RN: [name] Date of Birth/Sex: Female) Other Clinician: Primary Care Physician: [name] Treating STONE III, HOYT Referring Physician: [name] Physician/Extender: Suella Grove in Treatment: 23 Verbal / Phone Orders: Yes Clinician: Carolyne Fiscal, Debi Read Back and Verified: Yes Diagnosis Coding Wound Cleansing Wound #6 Left,Lateral Knee o Clean wound with Normal Saline. o Cleanse wound with mild soap and water o No tub bath. - give sink or bed baths Wound #8 Right,Medial Lower Leg o Clean wound with Normal Saline. o Cleanse wound with mild soap and water o No tub bath. - give sink or bed baths Anesthetic Wound #6 Left,Lateral Knee o Topical Lidocaine 4% cream applied to wound bed prior to debridement - for clinic use Wound #8 Right,Medial Lower Leg o Topical Lidocaine 4% cream applied to wound bed prior to debridement - for clinic use Primary Wound Dressing Wound #6 Left,Lateral Knee o Calcium Alginate Wound #8 Right,Medial Lower Leg o Calcium Alginate Secondary Dressing Wound #6 Left,Lateral Knee o Dry Gauze Wound #8 Right,Medial Lower Leg o Dry Gauze Dressing Change Frequency Wound #6 Left,Lateral Knee Mcneese, Cedrica C. ([id number]) o Change dressing every week Wound #8 Right,Medial Lower Leg o Change dressing every week Follow-up Appointments Wound #6 Left,Lateral Knee o Return Appointment in 1 week. Wound #8 Right,Medial Lower Leg o Return Appointment in 1 week. Edema Control Wound #6 Left,Lateral Knee o 3 Layer Compression System - Bilateral Wound #8 Right,Medial Lower Leg o 3 Layer Compression System - Bilateral Additional Orders / Instructions Wound #6 Left,Lateral Knee o Increase protein intake. Wound #8 Right,Medial Lower Leg o Increase protein  intake. Electronic Signature(s) Signed: 05/07/2016 5:42:16 PM By: [name] Signed: 05/08/2016 12:52:14 AM By: Worthy Keeler PA-C Entered By: [name] on 05/07/2016 14:17:09 Favata, [name] ([id number]) -------------------------------------------------------------------------------- Problem List Details Patient Name: [name], [name] 05/07/2016 1:30 Date of Service: PM Medical Record [id number] Number: Patient Account Number: [id number] 10/16/1934 (81 y.o. Treating RN: [name] Date of Birth/Sex: Female) Other Clinician: Primary Care Physician: [name] Treating STONE III, HOYT Referring Physician: [name] Physician/Extender: Suella Grove in Treatment: 23 Active Problems ICD-10 Encounter Code Description Active Date Diagnosis S81.812S Laceration without foreign body, left lower leg, sequela 04/03/2016 Yes I87.333 Chronic venous hypertension (idiopathic) with ulcer and 04/03/2016 Yes inflammation of bilateral lower extremity L97.812 Non-pressure chronic ulcer of other part of right lower leg 04/30/2016 Yes with fat layer exposed I89.0 Lymphedema, not elsewhere classified 04/03/2016 Yes K74.25 Chronic systolic (congestive) heart failure 04/03/2016 Yes Inactive Problems Resolved Problems Electronic Signature(s) Signed: 05/08/2016 12:52:14 AM By: Worthy Keeler PA-C Entered By: Worthy Keeler on 05/07/2016 23:50:39 Hellberg, [name] ([id number]) -------------------------------------------------------------------------------- Progress Note Details Patient Name: [name] 05/07/2016 1:30 Date of Service: PM Medical Record [id number] Number: Patient Account Number: [id number] 10-21-1934 (80 y.o. Treating RN: [name] Date of Birth/Sex: Female) Other Clinician: Primary Care Physician: [name] Treating STONE III, HOYT Referring Physician: [name] Physician/Extender: Suella Grove in Treatment:  23 Subjective Chief  Complaint Information obtained from Patient Patient here for reevaluation of her bilateral lower extremity wounds History of Present Illness (HPI) 11/22/15; this is Danielski is a 81 year old woman who lives at home on her own. According to the patient and her daughter was present she has had long-standing edema in her legs dating back many years. She also has a history of chronic systolic heart failure, atrial fibrillation and is status post mitral valve replacement. Last echocardiogram I see in [facility] showed an ejection fraction of 40-45% she is on Lasix 60 mg a day and spironolactone 25 mg a day. Her current problem began in December around Christmas time she developed a small hematoma in the medial part of her left leg which rapidly expanded to a very large hematoma that required surgical debridement. This situation was complicated by the fact that the patient is on long-standing Coumadin for mechanical heart valve. She went to the OR had this evacuated on January 4 /17. The wound has gradually improved however she has developed a small wounds around this area and more recently a wound on the right lateral leg. She is weeping edema fluid. The patient is already been to see vascular surgery. It was recommended that she wear Unna boots, she did not tolerate this due to pain in the left leg. She was then prescribed Juzo stockings and really can't get these on herself although truthfully there is probably too much edema for a Juzo stockings currently. She is not a diabetic and has no history of PAD or claudication that I could elicit. She does not use the external compression pumps reliably. She comes today with notes from her primary physician and Dr. Ronalee Belts both recommending various forms of compression but the patient does not really complied with them. Has been using the external compression pumps with some regularity but certainly not daily on the right leg and this has helped. I  also note that her daughter tells me the history that she does not sleep in bed at home. She has a hospital bed but with her legs up she finds this painful so she sleeps in the couch sitting up with her legs dependent. 11/29/15; the patient is arrives today accompanied by her son. He expresses satisfaction that she is maintained the compression all week. 12/06/15; the patient has kept her Profore light wraps on, we have good edema control no major change in the wounds we have been using Aquacel 12/13/15; changed to RTD last week. One of the 3 wounds on her left medial leg is healed she has 2 remaining wounds here and one on the right lateral leg. 12/18/2015 -- the patient was at Dr. Nino Parsley office today and he was seeing her for an arterial study. While the wrap was being removed she had an inadvertent laceration of the left proximal anterior leg which bled quite profusely and a compression dressing was applied over this and the patient was here to get her Profore wraps done. I was asked to see the patient to make an assessment and treat appropriately. 12/26/15; the patient's injury on the left proximal leg and Steri-Strips removed after soaking. There is an open area here. The original wounds to still open on the right lateral and left medial leg. [name], [name] ([id number]) 01/03/16 patient's injury on her proximal left leg looks quite good. Still small open area on the medial left leg which appears to be improving. The area on the right lateral leg still substantially open  with no real improvement in wound depth. Her edema control is marginal with a Profore light. We have been using RTD for 3-4 weeks without any major change 01/10/16: wounds without s/s of infection. vascular results are pending regarding arterial studies. 01/17/16; patient comes in today complaining of severe pain however I think most of this is in the right hip not related to her wounds. She continues with a oval-shaped  wound on the right lateral leg, trauma to the left anterior leg just below her tibial plateau. She has a smaller eschar on the left anterior leg. She is being using Prisma however she informs [place] today that she is actually allergic to silver, nose this from a previous application at [facility] some years ago 01/24/16; edema is not so well controlled today, I think I reduced her to [place] bilaterally last week. The area which was a scissor injury on her left upper anterior lower leg is fully epithelialized. She only has a small open area remaining on the medial aspect of her left leg. The oval-shaped wound on the right mid lateral leg may be a bit smaller. Debrided of surface slough nonviable subcutaneous tissue. I had changed to collagen 2 weeks ago in an attempt to get this to close 01/31/16 all the patient's wounds on her left leg give healed. We have good edema control with bilateral Profore lites which she has been compliant with. She still has the oval-shaped wound on the right lateral calf allergic even this appears to be slowly improving. The patient has juxtalite stockings at home. She states she thinks she can put these on. She also has external compression pumps at home although I think her compliance with this has not been good in the past. She complains today of edema in her thighs. Tells me she takes 40 mg of Lasix daily 02/21/16; only 1 small wound remains on the lateral aspect of the right calf. She is using Juzo stockings on the left leg although she complains about difficulty in applying them. She has external compression pumps at home 02/28/16; the small open wound on her right lateral calf is improved in terms of wound area. It appears that she has a wrap injury on the anterior aspect of the upper leg 03/06/16; the small open wound on her right lateral calf has a very small open area remaining. The wrap injury on the anterior aspect of the upper leg also appears better. She arrives  today in clinic with a history of dyspnea with minimal exertion starting with the last 2-4 days. She does not describe chest pain. Her son and our intake nurse think she has facial swelling. She has a history of an artificial mitral valve on Coumadin 03/13/16; the small wound on the right lateral calf is no better this week. The superior wrap injury anteriorly requires debridement of surface eschar and nonviable subcutaneous tissue. She has been to her primary doctor with regards to her dyspnea we identified last week. Per the patient's description her Lasix has been increased 03/21/2016 -- patient of Dr. Dellia Nims who could not keep her appointment yesterday but has come in today with the right lower leg looking good and this is the leg which has been treated in the recent past. However, her left lower extremity is extremely swollen, tender and edematous with redness and discoloration. 03/27/16; there is only 2 small areas remain on the right leg. The edema that was so concerning last week has come down however there is extensive bruising  on the lateral left leg medial left foot suggesting that she lost some blood into the leg itself. I checked her hemoglobin on 9/21 was 14.5. Her INR was over 6. Duplex ultrasound was negative for DVT 04/03/16 at this point in time today patient is actually doing substantially better in regard to the wound on the anterior right lower extremity. currently there is no slough or eschar noted and no evidence of erythema, discharge, or local infection. She is exhibiting no signs of systemic infection. She is tolerating the dressing changes as well as the wrapping at this point in time. 04/24/16; I have not seen Mrs. Moustafa for 3 weeks. Apparently she was admitted to hospital for respiratory issues/also apparently has had multiple compression fractures and had kyphoplasty. When she was last here she only had one small open area remaining on the left leg she was using  juxtalite stockings. Her son [name], [place] ([id number]) is upset about restarting the Coumadin which she blames or the swelling in her legs. She is on Coumadin for prophylaxis with a chronic artificial heart valve. 04/30/16 at this point in time patient has been tolerating the 3 layer compression wrap as well as the calcium alginate. 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 With tthat being said she does have the continued wound of the left lower extremity as well as the right medial lower extremity. The right side is significantly smaller compared to the left but actually is slough covered. fortunately she has no significant tenderness at rest although with manipulation of the right location of the wound this is significantly tender. 05/07/16 for follow-up evaluation today both the patient's wounds appear to be significantly improved in size compared to last week. She is also having less pain at both locations currently. Her pain level at most is related to be a 1-2 out of 10. She does have some discharge but fortunately no evidence of infection at this point.she also continues to tolerate the compression wraps well. Objective Constitutional Well-nourished and well-hydrated in no acute distress. Vitals Time Taken: 1:56 PM, Height: 68 in, Weight: 178 lbs, BMI: 27.1, Temperature: 97.7 F, Pulse: 80 bpm, Respiratory Rate: 16 breaths/min, Blood Pressure: 122/72 mmHg. Respiratory normal breathing without difficulty. clear to auscultation bilaterally. Cardiovascular regular rate and rhythm with normal S1, S2. 1+ bilateral pitting edema. Psychiatric this patient is able to make decisions and demonstrates good insight into disease process. Alert and Oriented x 3. pleasant and cooperative. General Notes: Again on evaluation today the right medial lower extremity wound with slough covered and required debridement. The left appears to continue to do well and the wound bed was very clean.  Neither showed sign of infection. Integumentary (Hair, Skin) Wound #6 status is Open. Original cause of wound was Trauma. The wound is located on the Left,Lateral Knee. The wound measures 1.3cm length x 1.9cm width x 0.1cm depth; 1.94cm^2 area and 0.194cm^3 volume. The wound is limited to skin breakdown. There is no tunneling or undermining noted. There is a large amount of serosanguineous drainage noted. The wound margin is distinct with the outline attached to the wound base. There is large (67-100%) red, pink granulation within the wound bed. There is a small (1-33%) amount of necrotic tissue within the wound bed including Eschar and Adherent Slough. The periwound skin appearance exhibited: Localized Edema, Moist, Erythema. The surrounding wound skin Soltis, Shakea C. ([id number]) color is noted with erythema which is circumferential. Periwound temperature was noted as No Abnormality. The periwound has tenderness  on palpation. Wound #8 status is Open. Original cause of wound was Trauma. The wound is located on the Right,Medial Lower Leg. The wound measures 0.3cm length x 0.3cm width x 0.2cm depth; 0.071cm^2 area and 0.014cm^3 volume. The wound is limited to skin breakdown. There is no tunneling or undermining noted. There is a large amount of serosanguineous drainage noted. The wound margin is distinct with the outline attached to the wound base. There is medium (34-66%) red granulation within the wound bed. There is a medium (34-66%) amount of necrotic tissue within the wound bed including Eschar and Adherent Slough. The periwound skin appearance exhibited: Localized Edema, Moist. Periwound temperature was noted as No Abnormality. The periwound has tenderness on palpation. Assessment Active Problems ICD-10 S81.812S - Laceration without foreign body, left lower leg, sequela I87.333 - Chronic venous hypertension (idiopathic) with ulcer and inflammation of bilateral lower  extremity L97.812 - Non-pressure chronic ulcer of other part of right lower leg with fat layer exposed I89.0 - Lymphedema, not elsewhere classified L89.21 - Chronic systolic (congestive) heart failure Plan Wound Cleansing: Wound #6 Left,Lateral Knee: Clean wound with Normal Saline. Cleanse wound with mild soap and water No tub bath. - give sink or bed baths Wound #8 Right,Medial Lower Leg: Clean wound with Normal Saline. Cleanse wound with mild soap and water No tub bath. - give sink or bed baths Anesthetic: Wound #6 Left,Lateral Knee: Topical Lidocaine 4% cream applied to wound bed prior to debridement - for clinic use Wound #8 Right,Medial Lower Leg: Topical Lidocaine 4% cream applied to wound bed prior to debridement - for clinic use Primary Wound Dressing: Wound #6 Left,Lateral Knee: Calcium Alginate Finch, Quantisha C. ([id number]) Wound #8 Right,Medial Lower Leg: Calcium Alginate Secondary Dressing: Wound #6 Left,Lateral Knee: Dry Gauze Wound #8 Right,Medial Lower Leg: Dry Gauze Dressing Change Frequency: Wound #6 Left,Lateral Knee: Change dressing every week Wound #8 Right,Medial Lower Leg: Change dressing every week Follow-up Appointments: Wound #6 Left,Lateral Knee: Return Appointment in 1 week. Wound #8 Right,Medial Lower Leg: Return Appointment in 1 week. Edema Control: Wound #6 Left,Lateral Knee: 3 Layer Compression System - Bilateral Wound #8 Right,Medial Lower Leg: 3 Layer Compression System - Bilateral Additional Orders / Instructions: Wound #6 Left,Lateral Knee: Increase protein intake. Wound #8 Right,Medial Lower Leg: Increase protein intake. Follow-Up Appointments: A follow-up appointment should be scheduled. Medication Reconciliation completed and provided to Patient/Care Provider. A Patient Clinical Summary of Care was provided to MS At this point in time we are going to proceed with the Iodosorb for the right medial lower extremity wound and  for the left lateraal knee we will utilize plain collagen no silver as she is allergic. We will also continue with the 3 layer compression wrapping which seems to be helping. QUESTIONS and concerns were answered to the best of my ability during the office visit today. We will see her for reevaluation in one week. Electronic Signature(s) Signed: 05/08/2016 8:57:57 PM By: Worthy Keeler PA-C Entered By: Worthy Keeler on 05/08/2016 20:57:17 Atteberry, [name] ([id number]) Latka, [name] ([id number]) -------------------------------------------------------------------------------- SuperBill Details Patient Name: [name]. Date of Service: 05/07/2016 Medical Record Number: [id number] Patient Account Number: [id number] Date of Birth/Sex: Apr 23, 1935 (80 y.o. Female) Treating RN: [name] Primary Care Physician: [name] Other Clinician: Referring Physician: [name] Treating Physician/Extender: Melburn Hake, HOYT Weeks in Treatment: 23 Diagnosis Coding ICD-10 Codes Code Description [phone] Laceration without foreign body, left lower leg, sequela Chronic venous hypertension (idiopathic) with ulcer and inflammation of bilateral  lower I87.333 extremity L97.812 Non-pressure chronic ulcer of other part of right lower leg with fat layer exposed I89.0 Lymphedema, not elsewhere classified V29.19 Chronic systolic (congestive) heart failure Facility Procedures CPT4: Description Modifier Quantity Code [id number] BILATERAL: Application of multi-layer venous compression 1 system; leg (below knee), including ankle and foot. Physician Procedures CPT4: Description Modifier Quantity Code [id number] - WC PHYS LEVEL 3 - EST PT 1 ICD-10 Description Diagnosis S81.812S Laceration without foreign body, left lower leg, sequela I87.333 Chronic venous hypertension (idiopathic) with ulcer and  inflammation of bilateral lower extremity L97.812 Non-pressure chronic ulcer of other  part of right lower leg with fat layer exposed I89.0 Lymphedema, not elsewhere classified Electronic Signature(s) Signed: 05/08/2016 12:52:14 AM By: Worthy Keeler PA-C Previous Signature: 05/07/2016 5:42:16 PM Version By: [name] Entered By: Worthy Keeler on 05/07/2016 23:52:06

## 2016-05-08 NOTE — Progress Notes (Addendum)
[name], [name] ([id number]) Visit Report for 05/07/2016 Arrival Information Details Patient Name: [name], [name]. Date of Service: 05/07/2016 1:30 PM Medical Record Number: [id number] Patient Account Number: [id number] Date of Birth/Sex: 1935/01/26 (80 y.o. Female) Treating RN: [name] Primary Care Physician: [name] Other Clinician: Referring Physician: [name] Treating Physician/Extender: Melburn Hake, HOYT Weeks in Treatment: 23 Visit Information History Since Last Visit All ordered tests and consults were completed: No Patient Arrived: Wheel Chair Added or deleted any medications: No Arrival Time: 13:55 Any new allergies or adverse reactions: No Accompanied By: self Had a fall or experienced change in No Transfer Assistance: EasyP[employer] Patient activities of daily living that may affect Lift risk of falls: Patient Identification Verified: Yes Signs or symptoms of abuse/neglect since last No Secondary Verification Process Yes visito Completed: Hospitalized since last visit: No Patient Requires Transmission- No Pain Present Now: No Based Precautions: Patient Has Alerts: Yes Patient Alerts: Patient on Blood Thinner [place] Signature(s) Signed: 05/07/2016 5:42:16 PM By: [name] Entered By: [name] on 05/07/2016 13:55:47 Gieger, [name] ([id number]) -------------------------------------------------------------------------------- Encounter Discharge Information Details Patient Name: Carrie Mulligan C. Date of Service: 05/07/2016 1:30 PM Medical Record Number: [id number] Patient Account Number: [id number] Date of Birth/Sex: 1934-07-04 (80 y.o. Female) Treating RN: [name] Primary Care Physician: [name] Other Clinician: Referring Physician: [name] Treating Physician/Extender: Melburn Hake, HOYT Weeks in Treatment: 68 Encounter Discharge Information Items Discharge Pain  Level: 0 Discharge Condition: Stable Ambulatory Status: Wheelchair Discharge Destination: Nursing Home Transportation: Other Accompanied By: caregiver Schedule Follow-up Appointment: Yes Medication Reconciliation completed and provided to Patient/Care Yes [name]: Provided on Clinical Summary of Care: 05/07/2016 Form Type Recipient Paper Patient MS Electronic Signature(s) Signed: 05/07/2016 2:38:59 PM By: Ruthine Dose Entered By: Ruthine Dose on 05/07/2016 14:38:59 [name], [name] ([id number]) -------------------------------------------------------------------------------- Lower Extremity Assessment Details Patient Name: Carrie Mulligan C. Date of Service: 05/07/2016 1:30 PM Medical Record Number: [id number] Patient Account Number: [id number] Date of Birth/Sex: 1935-02-06 (80 y.o. Female) Treating RN: [name] Primary Care Physician: [name] Other Clinician: Referring Physician: [name] Treating Physician/Extender: Melburn Hake, HOYT Weeks in Treatment: 23 Vascular Assessment Pulses: Posterior Tibial Dorsalis Pedis Palpable: [Left:Yes] [Right:Yes] Extremity colors, hair growth, and conditions: Extremity Color: [Left:Mottled] [Right:Mottled] Temperature of Extremity: [Left:Warm] [Right:Warm] Capillary Refill: [Left:< 3 seconds] [Right:< 3 seconds] Toe Nail Assessment Left: Right: Thick: Yes Yes Discolored: Yes Yes Deformed: No No Improper Length and Hygiene: No No Electronic Signature(s) Signed: 05/07/2016 5:42:16 PM By: [name] Entered By: [name] on 05/07/2016 13:57:55 [name], [name] ([id number]) -------------------------------------------------------------------------------- Multi Wound Chart Details Patient Name: Carrie Mulligan C. Date of Service: 05/07/2016 1:30 PM Medical Record Number: [id number] Patient Account Number: [id number] Date of Birth/Sex: [birth year] (80 y.o. Female) Treating RN: [name] Primary  Care Physician: [name] Other Clinician: Referring Physician: [name] Treating Physician/Extender: Melburn Hake, HOYT Weeks in Treatment: 23 Vital Signs Height(in): 68 Pulse(bpm): 80 Weight(lbs): 178 Blood Pressure 122/72 (mmHg): Body Mass Index(BMI): 27 Temperature(F): 97.7 Respiratory Rate 16 (breaths/min): Photos: [6:No Photos] [8:No Photos] [N/A:N/A] Wound Location: [6:Left Knee - Lateral] [8:Right Lower Leg - Medial] [N/A:N/A] Wounding Event: [6:Trauma] [8:Trauma] [N/A:N/A] Primary Etiology: [6:Trauma, Other] [8:Trauma, Other] [N/A:N/A] Comorbid History: [6:Cataracts, Arrhythmia, Hypotension, Osteoarthritis] [8:Cataracts, Arrhythmia, Hypotension, Osteoarthritis] [N/A:N/A] Date Acquired: [6:04/06/2016] [8:04/06/2016] [N/A:N/A] Weeks of Treatment: [6:1] [8:1] [N/A:N/A] Wound Status: [6:Open] [8:Open] [N/A:N/A] Measurements L x W x D 1.3x1.9x0.1 [8:0.3x0.3x0.2] [N/A:N/A] (cm) Area (cm) : [6:1.94] [8:0.071] [N/A:N/A] Volume (cm) : [4:1.287] [8:0.014] [N/A:N/A] % Reduction in  Area: [6:50.20%] [8:67.70%] [N/A:N/A] % Reduction in Volume: 50.30% [8:36.40%] [N/A:N/A] Classification: [6:Partial Thickness] [8:Partial Thickness] [N/A:N/A] Exudate Amount: [6:Large] [8:Large] [N/A:N/A] Exudate Type: [6:Serosanguineous] [8:Serosanguineous] [N/A:N/A] Exudate Color: [6:red, brown] [8:red, brown] [N/A:N/A] Wound Margin: [6:Distinct, outline attached] [8:Distinct, outline attached] [N/A:N/A] Granulation Amount: [6:Large (67-100%)] [8:Medium (34-66%)] [N/A:N/A] Granulation Quality: [6:Red, Pink] [8:Red] [N/A:N/A] Necrotic Amount: [6:Small (1-33%)] [8:Medium (34-66%)] [N/A:N/A] Necrotic Tissue: [6:Eschar, Adherent Slough] [8:Eschar, Adherent Slough] [N/A:N/A] Exposed Structures: [6:Fascia: No Fat: No Tendon: No Muscle: No Joint: No] [8:Fascia: No Fat: No Tendon: No Muscle: No Joint: No] [N/A:N/A] Bone: No Bone: No Limited to Skin Limited to Skin Breakdown  Breakdown Epithelialization: None None N/A Periwound Skin Texture: Edema: Yes Edema: Yes N/A Periwound Skin Moist: Yes Moist: Yes N/A Moisture: Periwound Skin Color: Erythema: Yes No Abnormalities Noted N/A Erythema Location: Circumferential N/A N/A Temperature: No Abnormality No Abnormality N/A Tenderness on Yes Yes N/A Palpation: Wound Preparation: Ulcer Cleansing: Other: Ulcer Cleansing: Other: N/A soap and water soap and water Topical Anesthetic Topical Anesthetic Applied: Other: lidocaine Applied: Other: lidocaine 4% 4% Treatment Notes Electronic Signature(s) Signed: 05/07/2016 5:42:16 PM By: [name] Entered By: [name] on 05/07/2016 14:14:56 Gorgas, [name] ([id number]) -------------------------------------------------------------------------------- [place] Details Patient Name: Carrie Kussmaul. Date of Service: 05/07/2016 1:30 PM Medical Record Number: [id number] Patient Account Number: [id number] Date of Birth/Sex: Apr 28, 1935 (80 y.o. Female) Treating RN: [name] Primary Care Physician: [name] Other Clinician: Referring Physician: [name] Treating Physician/Extender: Melburn Hake, HOYT Weeks in Treatment: 71 Active Inactive Orientation to the Wound Care [name] Nursing Diagnoses: Knowledge deficit related to the wound healing center [name] Goals: Patient/caregiver will verbalize understanding of the [name] Date Initiated: 11/22/2015 Goal Status: Active Interventions: Provide education on orientation to the wound center Notes: Venous Leg Ulcer Nursing Diagnoses: Knowledge deficit related to disease process and management Potential for venous Insuffiency (use before diagnosis confirmed) Goals: Patient will maintain optimal edema control Date Initiated: 11/22/2015 Goal Status: Active Patient/caregiver will verbalize understanding of disease process and disease management Date  Initiated: 11/22/2015 Goal Status: Active Verify adequate tissue perfusion prior to therapeutic compression application Date Initiated: 11/22/2015 Goal Status: Active Interventions: Assess peripheral edema status every visit. Compression as ordered Provide education on venous insufficiency [name], [name] ([id number]) Treatment Activities: Non-invasive vascular studies : 11/22/2015 Therapeutic compression applied : 11/22/2015 Notes: Wound/Skin Impairment Nursing Diagnoses: Impaired tissue integrity Knowledge deficit related to ulceration/compromised skin integrity Goals: Patient/caregiver will verbalize understanding of skin care regimen Date Initiated: 11/22/2015 Goal Status: Active Ulcer/skin breakdown will have a volume reduction of 30% by week 4 Date Initiated: 11/22/2015 Goal Status: Active Ulcer/skin breakdown will have a volume reduction of 50% by week 8 Date Initiated: 11/22/2015 Goal Status: Active Ulcer/skin breakdown will have a volume reduction of 80% by week 12 Date Initiated: 11/22/2015 Goal Status: Active Ulcer/skin breakdown will heal within 14 weeks Date Initiated: 11/22/2015 Goal Status: Active Interventions: Assess patient/caregiver ability to perform ulcer/skin care regimen upon admission and as needed Assess ulceration(s) every visit Provide education on ulcer and skin care Treatment Activities: Skin care regimen initiated : 11/22/2015 Topical wound management initiated : 11/22/2015 Notes: Electronic Signature(s) Signed: 05/07/2016 5:42:16 PM By: [name] Entered By: [name] on 05/07/2016 14:14:41 Gabler, Nayana C. ([id number]) Ackerley, Chen C. ([id number]) -------------------------------------------------------------------------------- Pain Assessment Details Patient Name: Carrie Mulligan C. Date of Service: 05/07/2016 1:30 PM Medical Record Number: [id number] Patient Account Number: [id number] Date of Birth/Sex: 1934-08-28 (80  y.o. Female) Treating RN: [name] Primary Care Physician:  [name] Other Clinician: Referring Physician: [name] Treating Physician/Extender: Melburn Hake, HOYT Weeks in Treatment: 85 Active Problems Location of Pain Severity and Description of Pain Patient Has Paino No Site Locations With Dressing Change: No Pain Management and Medication Current Pain Management: Electronic Signature(s) Signed: 05/07/2016 5:42:16 PM By: [name] Entered By: [name] on 05/07/2016 13:56:32 Ou, [name] ([id number]) -------------------------------------------------------------------------------- Patient/Caregiver Education Details Patient Name: Carrie Kussmaul. Date of Service: 05/07/2016 1:30 PM Medical Record Number: [id number] Patient Account Number: [id number] Date of Birth/Gender: 1934/07/26 (80 y.o. Female) Treating RN: [name] Primary Care Physician: [name] Other Clinician: Referring Physician: [name] Treating Physician/Extender: Melburn Hake, HOYT Weeks in Treatment: 61 Education Assessment Education Provided To: Patient Education Topics Provided Wound/Skin Impairment: Handouts: Other: change dressing as ordered and do not get dressings wet Methods: Demonstration, Explain/Verbal Responses: State content correctly Electronic Signature(s) Signed: 05/07/2016 5:42:16 PM By: [name] Entered By: [name] on 05/07/2016 14:13:00 Earp, [name] ([id number]) -------------------------------------------------------------------------------- Wound Assessment Details Patient Name: [name], [name] Date of Service: 05/07/2016 1:30 PM Medical Record Number: [id number] Patient Account Number: [id number] Date of Birth/Sex: 1934-12-13 (80 y.o. Female) Treating RN: Carolyne Fiscal, Debi Primary Care Physician: [name] Other Clinician: Referring Physician: [name] Treating Physician/Extender: Melburn Hake, HOYT Weeks  in Treatment: 23 Wound Status Wound Number: 6 Primary Trauma, Other Etiology: Wound Location: Left Knee - Lateral Wound Status: Open Wounding Event: Trauma Comorbid Cataracts, Arrhythmia, Hypotension, Date Acquired: 04/06/2016 History: Osteoarthritis Weeks Of Treatment: 1 Clustered Wound: No Photos Photo Uploaded By: [name] on 05/07/2016 17:30:38 Wound Measurements Length: (cm) 1.3 Width: (cm) 1.9 Depth: (cm) 0.1 Area: (cm) 1.94 Volume: (cm) 0.194 % Reduction in Area: 50.2% % Reduction in Volume: 50.3% Epithelialization: None Tunneling: No Undermining: No Wound Description Classification: Partial Thickness Wound Margin: Distinct, outline attached Exudate Amount: Large Exudate Type: Serosanguineous Exudate Color: red, brown Foul Odor After Cleansing: No Wound Bed Granulation Amount: Large (67-100%) Exposed Structure Granulation Quality: Red, Pink Fascia Exposed: No Necrotic Amount: Small (1-33%) Fat Layer Exposed: No Necrotic Quality: Eschar, Adherent Slough Tendon Exposed: No [name], [name] ([id number]) Muscle Exposed: No Joint Exposed: No Bone Exposed: No Limited to Skin Breakdown Periwound Skin Texture Texture Color No Abnormalities Noted: No No Abnormalities Noted: No Localized Edema: Yes Erythema: Yes Erythema Location: Circumferential Moisture No Abnormalities Noted: No Temperature / Pain Moist: Yes Temperature: No Abnormality Tenderness on Palpation: Yes Wound Preparation Ulcer Cleansing: Other: soap and water, Topical Anesthetic Applied: Other: lidocaine 4%, Electronic Signature(s) Signed: 05/07/2016 5:42:16 PM By: [name] Entered By: [name] on 05/07/2016 14:07:35 [name], [name] ([id number]) -------------------------------------------------------------------------------- Wound Assessment Details Patient Name: [name], Ariea C. Date of Service: 05/07/2016 1:30 PM Medical Record Number:  [id number] Patient Account Number: [id number] Date of Birth/Sex: 1934/07/08 (80 y.o. Female) Treating RN: Carolyne Fiscal, Debi Primary Care Physician: [name] Other Clinician: Referring Physician: [name] Treating Physician/Extender: Melburn Hake, HOYT Weeks in Treatment: 23 Wound Status Wound Number: 8 Primary Trauma, Other Etiology: Wound Location: Right Lower Leg - Medial Wound Status: Open Wounding Event: Trauma Comorbid Cataracts, Arrhythmia, Hypotension, Date Acquired: 04/06/2016 History: Osteoarthritis Weeks Of Treatment: 1 Clustered Wound: No Photos Photo Uploaded By: [name] on 05/07/2016 17:30:38 Wound Measurements Length: (cm) 0.3 Width: (cm) 0.3 Depth: (cm) 0.2 Area: (cm) 0.071 Volume: (cm) 0.014 % Reduction in Area: 67.7% % Reduction in Volume: 36.4% Epithelialization: None Tunneling: No Undermining: No Wound Description Classification: Partial Thickness Wound Margin: Distinct, outline attached Exudate Amount: Large Exudate Type: Serosanguineous Exudate Color:  red, brown Foul Odor After Cleansing: No Wound Bed Granulation Amount: Medium (34-66%) Exposed Structure Granulation Quality: Red Fascia Exposed: No Necrotic Amount: Medium (34-66%) Fat Layer Exposed: No Necrotic Quality: Eschar, Adherent Slough Tendon Exposed: No [name], [name] ([id number]) Muscle Exposed: No Joint Exposed: No Bone Exposed: No Limited to Skin Breakdown Periwound Skin Texture Texture Color No Abnormalities Noted: No No Abnormalities Noted: No Localized Edema: Yes Temperature / Pain Moisture Temperature: No Abnormality No Abnormalities Noted: No Tenderness on Palpation: Yes Moist: Yes Wound Preparation Ulcer Cleansing: Other: soap and water, Topical Anesthetic Applied: Other: lidocaine 4%, Electronic Signature(s) Signed: 05/07/2016 5:42:16 PM By: [name] Entered By: [name] on 05/07/2016 14:08:38 [name], [name]  ([id number]) -------------------------------------------------------------------------------- Vitals Details Patient Name: Carrie Mulligan C. Date of Service: 05/07/2016 1:30 PM Medical Record Number: [id number] Patient Account Number: [id number] Date of Birth/Sex: 02/12/1935 (80 y.o. Female) Treating RN: Carolyne Fiscal, Debi Primary Care Physician: [name] Other Clinician: Referring Physician: [name] Treating Physician/Extender: Melburn Hake, HOYT Weeks in Treatment: 23 Vital Signs Time Taken: 13:56 Temperature (F): 97.7 Height (in): 68 Pulse (bpm): 80 Weight (lbs): 178 Respiratory Rate (breaths/min): 16 Body Mass Index (BMI): 27.1 Blood Pressure (mmHg): 122/72 Reference Range: 80 - 120 mg / dl Electronic Signature(s) Signed: 05/07/2016 5:42:16 PM By: [name] Entered By: [name] on 05/07/2016 13:57:06

## 2016-05-09 LAB — CBC WITH DIFFERENTIAL/PLATELET
Basophils Absolute: 0 10*3/uL (ref 0–0.1)
Basophils Relative: 1 %
EOS ABS: 0 10*3/uL (ref 0–0.7)
EOS PCT: 0 %
HCT: 46.2 % (ref 35.0–47.0)
Hemoglobin: 15 g/dL (ref 12.0–16.0)
LYMPHS ABS: 0.4 10*3/uL — AB (ref 1.0–3.6)
Lymphocytes Relative: 5 %
MCH: 26.8 pg (ref 26.0–34.0)
MCHC: 32.5 g/dL (ref 32.0–36.0)
MCV: 82.3 fL (ref 80.0–100.0)
MONO ABS: 0.8 10*3/uL (ref 0.2–0.9)
MONOS PCT: 11 %
Neutro Abs: 5.6 10*3/uL (ref 1.4–6.5)
Neutrophils Relative %: 83 %
PLATELETS: 250 10*3/uL (ref 150–440)
RBC: 5.61 MIL/uL — ABNORMAL HIGH (ref 3.80–5.20)
RDW: 19 % — AB (ref 11.5–14.5)
WBC: 6.8 10*3/uL (ref 3.6–11.0)

## 2016-05-09 LAB — COMPREHENSIVE METABOLIC PANEL
ALT: 15 U/L (ref 14–54)
ANION GAP: 13 (ref 5–15)
AST: 22 U/L (ref 15–41)
Albumin: 4.1 g/dL (ref 3.5–5.0)
Alkaline Phosphatase: 166 U/L — ABNORMAL HIGH (ref 38–126)
BUN: 38 mg/dL — ABNORMAL HIGH (ref 6–20)
CHLORIDE: 90 mmol/L — AB (ref 101–111)
CO2: 29 mmol/L (ref 22–32)
Calcium: 9.7 mg/dL (ref 8.9–10.3)
Creatinine, Ser: 1.75 mg/dL — ABNORMAL HIGH (ref 0.44–1.00)
GFR calc non Af Amer: 27 mL/min — ABNORMAL LOW (ref >60)
GFR, EST AFRICAN AMERICAN: 31 mL/min — AB (ref >60)
Glucose, Bld: 93 mg/dL (ref 65–99)
Potassium: 2.9 mmol/L — ABNORMAL LOW (ref 3.5–5.1)
SODIUM: 132 mmol/L — AB (ref 135–145)
Total Bilirubin: 1.3 mg/dL — ABNORMAL HIGH (ref 0.3–1.2)
Total Protein: 7.3 g/dL (ref 6.5–8.1)

## 2016-05-09 LAB — PROTIME-INR
INR: 4.59 — AB
PROTHROMBIN TIME: 44.7 s — AB (ref 11.4–15.2)

## 2016-05-10 LAB — PROTIME-INR
INR: 4.01
Prothrombin Time: 40.1 seconds — ABNORMAL HIGH (ref 11.4–15.2)

## 2016-05-10 LAB — POTASSIUM: POTASSIUM: 4.3 mmol/L (ref 3.5–5.1)

## 2016-05-11 LAB — URINALYSIS COMPLETE WITH MICROSCOPIC (ARMC ONLY)
BILIRUBIN URINE: NEGATIVE
Glucose, UA: NEGATIVE mg/dL
Hgb urine dipstick: NEGATIVE
KETONES UR: NEGATIVE mg/dL
NITRITE: NEGATIVE
PH: 5 (ref 5.0–8.0)
Protein, ur: NEGATIVE mg/dL
Specific Gravity, Urine: 1.014 (ref 1.005–1.030)
Trans Epithel, UA: 3

## 2016-05-11 LAB — PROTIME-INR
INR: 3.54
Prothrombin Time: 36.3 seconds — ABNORMAL HIGH (ref 11.4–15.2)

## 2016-05-12 LAB — URINE CULTURE

## 2016-05-13 ENCOUNTER — Other Ambulatory Visit
Admission: RE | Admit: 2016-05-13 | Discharge: 2016-05-13 | Disposition: A | Discharge status: Home / Self Care | Payer: Medicare Other | Source: Ambulatory Visit | Attending: Internal Medicine | Admitting: Internal Medicine

## 2016-05-13 ENCOUNTER — Non-Acute Institutional Stay (SKILLED_NURSING_FACILITY): Payer: Medicare Other | Admitting: Gerontology

## 2016-05-13 DIAGNOSIS — I951 Orthostatic hypotension: Secondary | ICD-10-CM | POA: Diagnosis not present

## 2016-05-13 DIAGNOSIS — Z952 Presence of prosthetic heart valve: Secondary | ICD-10-CM | POA: Diagnosis present

## 2016-05-13 LAB — PROTIME-INR
INR: 1.66
Prothrombin Time: 19.8 seconds — ABNORMAL HIGH (ref 11.4–15.2)

## 2016-05-14 ENCOUNTER — Other Ambulatory Visit
Admission: RE | Admit: 2016-05-14 | Discharge: 2016-05-14 | Disposition: A | Discharge status: Home / Self Care | Payer: Medicare Other | Source: Ambulatory Visit | Attending: Internal Medicine | Admitting: Internal Medicine

## 2016-05-14 ENCOUNTER — Encounter: Payer: No Typology Code available for payment source | Admitting: Internal Medicine

## 2016-05-14 DIAGNOSIS — R350 Frequency of micturition: Secondary | ICD-10-CM | POA: Diagnosis present

## 2016-05-14 LAB — COMPREHENSIVE METABOLIC PANEL
ALBUMIN: 3.9 g/dL (ref 3.5–5.0)
ALK PHOS: 153 U/L — AB (ref 38–126)
ALT: 16 U/L (ref 14–54)
AST: 22 U/L (ref 15–41)
Anion gap: 14 (ref 5–15)
BILIRUBIN TOTAL: 0.9 mg/dL (ref 0.3–1.2)
BUN: 65 mg/dL — AB (ref 6–20)
CALCIUM: 9.5 mg/dL (ref 8.9–10.3)
CO2: 26 mmol/L (ref 22–32)
CREATININE: 2.89 mg/dL — AB (ref 0.44–1.00)
Chloride: 91 mmol/L — ABNORMAL LOW (ref 101–111)
GFR calc Af Amer: 17 mL/min — ABNORMAL LOW (ref >60)
GFR, EST NON AFRICAN AMERICAN: 15 mL/min — AB (ref >60)
GLUCOSE: 103 mg/dL — AB (ref 65–99)
POTASSIUM: 3.1 mmol/L — AB (ref 3.5–5.1)
Sodium: 131 mmol/L — ABNORMAL LOW (ref 135–145)
TOTAL PROTEIN: 7 g/dL (ref 6.5–8.1)

## 2016-05-14 LAB — CBC WITH DIFFERENTIAL/PLATELET
Basophils Absolute: 0.1 10*3/uL (ref 0–0.1)
Basophils Relative: 2 %
EOS ABS: 0.2 10*3/uL (ref 0–0.7)
Eosinophils Relative: 2 %
HEMATOCRIT: 45.6 % (ref 35.0–47.0)
HEMOGLOBIN: 15.3 g/dL (ref 12.0–16.0)
LYMPHS ABS: 0.9 10*3/uL — AB (ref 1.0–3.6)
LYMPHS PCT: 12 %
MCH: 27.7 pg (ref 26.0–34.0)
MCHC: 33.5 g/dL (ref 32.0–36.0)
MCV: 82.7 fL (ref 80.0–100.0)
MONOS PCT: 9 %
Monocytes Absolute: 0.7 10*3/uL (ref 0.2–0.9)
NEUTROS PCT: 75 %
Neutro Abs: 5.7 10*3/uL (ref 1.4–6.5)
Platelets: 216 10*3/uL (ref 150–440)
RBC: 5.52 MIL/uL — AB (ref 3.80–5.20)
RDW: 19.3 % — ABNORMAL HIGH (ref 11.5–14.5)
WBC: 7.6 10*3/uL (ref 3.6–11.0)

## 2016-05-14 LAB — URINALYSIS COMPLETE WITH MICROSCOPIC (ARMC ONLY)
Bacteria, UA: NONE SEEN
Bilirubin Urine: NEGATIVE
GLUCOSE, UA: NEGATIVE mg/dL
HGB URINE DIPSTICK: NEGATIVE
Ketones, ur: NEGATIVE mg/dL
Leukocytes, UA: NEGATIVE
Nitrite: NEGATIVE
Protein, ur: NEGATIVE mg/dL
RBC / HPF: NONE SEEN RBC/hpf (ref 0–5)
Specific Gravity, Urine: 1.018 (ref 1.005–1.030)
WBC, UA: NONE SEEN WBC/hpf (ref 0–5)
pH: 5 (ref 5.0–8.0)

## 2016-05-14 LAB — PROTIME-INR
INR: 1.52
Prothrombin Time: 18.5 seconds — ABNORMAL HIGH (ref 11.4–15.2)

## 2016-05-15 NOTE — Progress Notes (Addendum)
DEREONNA, LENSING ([id number]) Visit Report for 05/14/2016 Chief Complaint Document Details HALLA, CHOPP Date of Service: 05/14/2016 1:30 PM Patient Name: C. Patient Account Number: [id number] Medical Record Treating RN: [name] [id number] Number: Other Clinician: May 29, 1935 (81 y.o. Treating Penni Penado Date of Birth/Sex: Female) Physician/Extender: G Primary Care [name] Physician: Referring Physician: [name] in Treatment: 24 Information Obtained from: Patient Chief Complaint Patient here for reevaluation of her bilateral lower extremity wounds Electronic Signature(s) Signed: 05/14/2016 4:42:06 PM By: [name] MD Entered By: [name] on 05/14/2016 15:00:20 Marner, [name] ([id number]) -------------------------------------------------------------------------------- HPI Details [name] Date of Service: 05/14/2016 1:30 PM Patient Name: C. Patient Account Number: [id number] Medical Record Treating RN: [name] [id number] Number: Other Clinician: 01-08-1935 (81 y.o. Treating Chaquita Basques, [place] Date of Birth/Sex: Female) Physician/Extender: G [place] Physician: Referring Physician: [name] in Treatment: 24 History of Present Illness HPI Description: 11/22/15; this is Heyboer is a 81 year old woman who lives at home on her own. According to the patient and her daughter was present she has had long-standing edema in her legs dating back many years. She also has a history of chronic systolic heart failure, atrial fibrillation and is status post mitral valve replacement. Last echocardiogram I see in [facility] showed an ejection fraction of 40-45% she is on Lasix 60 mg a day and spironolactone 25 mg a day. Her current problem began in December around Christmas time she developed a small hematoma in the medial part of her left leg which rapidly expanded to a very large hematoma  that required surgical debridement. This situation was complicated by the fact that the patient is on long-standing Coumadin for mechanical heart valve. She went to the OR had this evacuated on January 4 /17. The wound has gradually improved however she has developed a small wounds around this area and more recently a wound on the right lateral leg. She is weeping edema fluid. The patient is already been to see vascular surgery. It was recommended that she wear Unna boots, she did not tolerate this due to pain in the left leg. She was then prescribed Juzo stockings and really can't get these on herself although truthfully there is probably too much edema for a Juzo stockings currently. She is not a diabetic and has no history of PAD or claudication that I could elicit. She does not use the external compression pumps reliably. She comes today with notes from her primary physician and Dr. Ronalee Belts both recommending various forms of compression but the patient does not really complied with them. Has been using the external compression pumps with some regularity but certainly not daily on the right leg and this has helped. I also note that her daughter tells me the history that she does not sleep in bed at home. She has a hospital bed but with her legs up she finds this painful so she sleeps in the couch sitting up with her legs dependent. 11/29/15; the patient is arrives today accompanied by her son. He expresses satisfaction that she is maintained the compression all week. 12/06/15; the patient has kept her Profore light wraps on, we have good edema control no major change in the wounds we have been using Aquacel 12/13/15; changed to RTD last week. One of the 3 wounds on her left medial leg is healed she has 2 remaining wounds here and one on the right lateral leg. 12/18/2015 -- the patient was at Dr. Nino Parsley office today and he  was seeing her for an arterial study. While the wrap was being removed  she had an inadvertent laceration of the left proximal anterior leg which bled quite profusely and a compression dressing was applied over this and the patient was here to get her Profore wraps done. I was asked to see the patient to make an assessment and treat appropriately. 12/26/15; the patient's injury on the left proximal leg and Steri-Strips removed after soaking. There is an open area here. The original wounds to still open on the right lateral and left medial leg. 01/03/16 patient's injury on her proximal left leg looks quite good. Still small open area on the medial left leg which appears to be improving. The area on the right lateral leg still substantially open with no real improvement in wound depth. Her edema control is marginal with a Profore light. We have been using RTD Yott, Etienne C. ([id number]) for 3-4 weeks without any major change 01/10/16: wounds without s/s of infection. vascular results are pending regarding arterial studies. 01/17/16; patient comes in today complaining of severe pain however I think most of this is in the right hip not related to her wounds. She continues with a oval-shaped wound on the right lateral leg, trauma to the left anterior leg just below her tibial plateau. She has a smaller eschar on the left anterior leg. She is being using Prisma however she informs [place] today that she is actually allergic to silver, nose this from a previous application at [facility] some years ago 01/24/16; edema is not so well controlled today, I think I reduced her to [place] bilaterally last week. The area which was a scissor injury on her left upper anterior lower leg is fully epithelialized. She only has a small open area remaining on the medial aspect of her left leg. The oval-shaped wound on the right mid lateral leg may be a bit smaller. Debrided of surface slough nonviable subcutaneous tissue. I had changed to collagen 2 weeks ago in an attempt to get this to  close 01/31/16 all the patient's wounds on her left leg give healed. We have good edema control with bilateral Profore lites which she has been compliant with. She still has the oval-shaped wound on the right lateral calf allergic even this appears to be slowly improving. The patient has juxtalite stockings at home. She states she thinks she can put these on. She also has external compression pumps at home although I think her compliance with this has not been good in the past. She complains today of edema in her thighs. Tells me she takes 40 mg of Lasix daily 02/21/16; only 1 small wound remains on the lateral aspect of the right calf. She is using Juzo stockings on the left leg although she complains about difficulty in applying them. She has external compression pumps at home 02/28/16; the small open wound on her right lateral calf is improved in terms of wound area. It appears that she has a wrap injury on the anterior aspect of the upper leg 03/06/16; the small open wound on her right lateral calf has a very small open area remaining. The wrap injury on the anterior aspect of the upper leg also appears better. She arrives today in clinic with a history of dyspnea with minimal exertion starting with the last 2-4 days. She does not describe chest pain. Her son and our intake nurse think she has facial swelling. She has a history of an artificial mitral valve on Coumadin  03/13/16; the small wound on the right lateral calf is no better this week. The superior wrap injury anteriorly requires debridement of surface eschar and nonviable subcutaneous tissue. She has been to her primary doctor with regards to her dyspnea we identified last week. Per the patient's description her Lasix has been increased 03/21/2016 -- patient of Dr. Dellia Nims who could not keep her appointment yesterday but has come in today with the right lower leg looking good and this is the leg which has been treated in the recent past.  However, her left lower extremity is extremely swollen, tender and edematous with redness and discoloration. 03/27/16; there is only 2 small areas remain on the right leg. The edema that was so concerning last week has come down however there is extensive bruising on the lateral left leg medial left foot suggesting that she lost some blood into the leg itself. I checked her hemoglobin on 9/21 was 14.5. Her INR was over 6. Duplex ultrasound was negative for DVT 04/03/16 at this point in time today patient is actually doing substantially better in regard to the wound on the anterior right lower extremity. currently there is no slough or eschar noted and no evidence of erythema, discharge, or local infection. She is exhibiting no signs of systemic infection. She is tolerating the dressing changes as well as the wrapping at this point in time. 04/24/16; I have not seen Mrs. Minish for 3 weeks. Apparently she was admitted to hospital for respiratory issues/also apparently has had multiple compression fractures and had kyphoplasty. When she was last here she only had one small open area remaining on the left leg she was using juxtalite stockings. Her son is upset about restarting the Coumadin which she blames or the swelling in her legs. She is on Coumadin for prophylaxis with a chronic artificial heart valve. POPPI, SCANTLING ([id number]) 04/30/16 at this point in time patient has been tolerating the 3 layer compression wrap as well as the calcium alginate. 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 With tthat being said she does have the continued wound of the left lower extremity as well as the right medial lower extremity. The right side is significantly smaller compared to the left but actually is slough covered. fortunately she has no significant tenderness at rest although with manipulation of the right location of the wound this is significantly tender. 05/07/16 for follow-up evaluation  today both the patient's wounds appear to be significantly improved in size compared to last week. She is also having less pain at both locations currently. Her pain level at most is related to be a 1-2 out of 10. She does have some discharge but fortunately no evidence of infection at this point.she also continues to tolerate the compression wraps well. 05/14/16; the patient's wound on the left leg actually looks as though it's on its way to closure. She is using collagen to this area. She has a small punched out area over the right medial calf. The cause of this is not really clear it has probably 0.4 cm of depth. She has an IV in her right hand which she states is for IV fluid when she develops low sodiums her potassiums, the etiology of this is not clear Electronic Signature(s) Signed: 05/14/2016 4:42:06 PM By: [name] MD Entered By: [name] on 05/14/2016 15:07:26 Mcauliff, [name] ([id number]) -------------------------------------------------------------------------------- Physical Exam Details [name] Date of Service: 05/14/2016 1:30 PM Patient Name: C. Patient Account Number: [id number] Medical Record Treating  RN: [name] [id number] Number: Other Clinician: 1934/10/02 (81 y.o. Treating Frutoso Dimare Date of Birth/Sex: Female) Physician/Extender: G Primary Care [name] Physician: Referring Physician: [name] in Treatment: 24 Constitutional Sitting or standing Blood Pressure is within target range for patient.. Pulse regular and within target range for patient.Marland Kitchen Respirations regular, non-labored and within target range.. Temperature is normal and within the target range for the patient.. Patient's appearance is neat and clean. Appears in no acute distress. Well nourished and well developed.. Notes Wound exam; neither one of her wounds required debridement. I continued with the collagen to the left. The area on her right  medial calf they have been using Iodoflex which I think is reasonable. The cause of this wound is a bit obscure Electronic Signature(s) Signed: 05/14/2016 4:42:06 PM By: [name] MD Entered By: [name] on 05/14/2016 15:05:52 Glaude, [name] ([id number]) -------------------------------------------------------------------------------- Physician Orders Details [name] Date of Service: 05/14/2016 1:30 PM Patient Name: C. Patient Account Number: [id number] Medical Record Treating RN: [name] [id number] Number: Other Clinician: 1934/10/25 (81 y.o. Treating Tsuyako Jolley, [place] Date of Birth/Sex: Female) Physician/Extender: G Primary Care [name] Physician: Referring Physician: [name] in Treatment: 24 Verbal / Phone Orders: Yes Clinician: Carolyne Fiscal, Debi Read Back and Verified: Yes Diagnosis Coding Wound Cleansing Wound #6 Left,Lateral Knee o Clean wound with Normal Saline. o Cleanse wound with mild soap and water o No tub bath. - give sink or bed baths Wound #8 Right,Medial Lower Leg o Clean wound with Normal Saline. o Cleanse wound with mild soap and water o No tub bath. - give sink or bed baths Anesthetic Wound #6 Left,Lateral Knee o Topical Lidocaine 4% cream applied to wound bed prior to debridement - for clinic use Wound #8 Right,Medial Lower Leg o Topical Lidocaine 4% cream applied to wound bed prior to debridement - for clinic use Primary Wound Dressing Wound #6 Left,Lateral Knee o Promogran Wound #8 Right,Medial Lower Leg o Iodoflex Secondary Dressing Wound #6 Left,Lateral Knee o Dry Gauze Wound #8 Right,Medial Lower Leg o Dry Gauze Carrier, Velita C. ([id number]) Dressing Change Frequency Wound #6 Left,Lateral Knee o Change dressing every week Wound #8 Right,Medial Lower Leg o Change dressing every week Follow-up Appointments Wound #6 Left,Lateral Knee o Return Appointment in 1  week. Wound #8 Right,Medial Lower Leg o Return Appointment in 1 week. Edema Control Wound #6 Left,Lateral Knee o 3 Layer Compression System - Bilateral Wound #8 Right,Medial Lower Leg o 3 Layer Compression System - Bilateral Additional Orders / Instructions Wound #6 Left,Lateral Knee o Increase protein intake. Wound #8 Right,Medial Lower Leg o Increase protein intake. Electronic Signature(s) Signed: 05/14/2016 4:24:36 PM By: [name] Signed: 05/14/2016 4:42:06 PM By: [name] MD Entered By: [name] on 05/14/2016 14:08:36 Madewell, [name] ([id number]) -------------------------------------------------------------------------------- Problem List Details [name] Date of Service: 05/14/2016 1:30 PM Patient Name: C. Patient Account Number: [id number] Medical Record Treating RN: [name] [id number] Number: Other Clinician: [birth year] (81 y.o. Treating Saraiyah Hemminger Date of Birth/Sex: Female) Physician/Extender: G Primary Care [name] Physician: Referring Physician: [name] in Treatment: 24 Active Problems ICD-10 Encounter Code Description Active Date Diagnosis S81.812S Laceration without foreign body, left lower leg, sequela 04/03/2016 Yes I87.333 Chronic venous hypertension (idiopathic) with ulcer and 04/03/2016 Yes inflammation of bilateral lower extremity L97.812 Non-pressure chronic ulcer of other part of right lower leg 04/30/2016 Yes with fat layer exposed I89.0 Lymphedema, not elsewhere classified 04/03/2016 Yes S06.30 Chronic systolic (congestive) heart failure 04/03/2016 Yes  Inactive Problems Resolved Problems Electronic Signature(s) Signed: 05/14/2016 4:42:06 PM By: [name] MD Entered By: [name] on 05/14/2016 14:59:47 Pienta, [name] ([id number]) -------------------------------------------------------------------------------- Progress Note Details [name] Date of  Service: 05/14/2016 1:30 PM Patient Name: C. Patient Account Number: [id number] Medical Record Treating RN: [name] [id number] Number: Other Clinician: 11/16/1934 (81 y.o. Treating Kaoir Loree, [place] Date of Birth/Sex: Female) Physician/Extender: G Primary Care [name] Physician: Referring Physician: [name] in Treatment: 24 Subjective Chief Complaint Information obtained from Patient Patient here for reevaluation of her bilateral lower extremity wounds History of Present Illness (HPI) 11/22/15; this is Plass is a 81 year old woman who lives at home on her own. According to the patient and her daughter was present she has had long-standing edema in her legs dating back many years. She also has a history of chronic systolic heart failure, atrial fibrillation and is status post mitral valve replacement. Last echocardiogram I see in [facility] showed an ejection fraction of 40-45% she is on Lasix 60 mg a day and spironolactone 25 mg a day. Her current problem began in December around Christmas time she developed a small hematoma in the medial part of her left leg which rapidly expanded to a very large hematoma that required surgical debridement. This situation was complicated by the fact that the patient is on long-standing Coumadin for mechanical heart valve. She went to the OR had this evacuated on January 4 /17. The wound has gradually improved however she has developed a small wounds around this area and more recently a wound on the right lateral leg. She is weeping edema fluid. The patient is already been to see vascular surgery. It was recommended that she wear Unna boots, she did not tolerate this due to pain in the left leg. She was then prescribed Juzo stockings and really can't get these on herself although truthfully there is probably too much edema for a Juzo stockings currently. She is not a diabetic and has no history of PAD or claudication that I  could elicit. She does not use the external compression pumps reliably. She comes today with notes from her primary physician and Dr. Ronalee Belts both recommending various forms of compression but the patient does not really complied with them. Has been using the external compression pumps with some regularity but certainly not daily on the right leg and this has helped. I also note that her daughter tells me the history that she does not sleep in bed at home. She has a hospital bed but with her legs up she finds this painful so she sleeps in the couch sitting up with her legs dependent. 11/29/15; the patient is arrives today accompanied by her son. He expresses satisfaction that she is maintained the compression all week. 12/06/15; the patient has kept her Profore light wraps on, we have good edema control no major change in the wounds we have been using Aquacel 12/13/15; changed to RTD last week. One of the 3 wounds on her left medial leg is healed she has 2 remaining wounds here and one on the right lateral leg. 12/18/2015 -- the patient was at Dr. Nino Parsley office today and he was seeing her for an arterial study. While the wrap was being removed she had an inadvertent laceration of the left proximal anterior leg which bled quite profusely and a compression dressing was applied over this and the patient was here to get her Profore wraps done. I was asked to see the patient to make  an assessment and treat appropriately. EVALINA, TABAK ([id number]) 12/26/15; the patient's injury on the left proximal leg and Steri-Strips removed after soaking. There is an open area here. The original wounds to still open on the right lateral and left medial leg. 01/03/16 patient's injury on her proximal left leg looks quite good. Still small open area on the medial left leg which appears to be improving. The area on the right lateral leg still substantially open with no real improvement in wound depth. Her edema  control is marginal with a Profore light. We have been using RTD for 3-4 weeks without any major change 01/10/16: wounds without s/s of infection. vascular results are pending regarding arterial studies. 01/17/16; patient comes in today complaining of severe pain however I think most of this is in the right hip not related to her wounds. She continues with a oval-shaped wound on the right lateral leg, trauma to the left anterior leg just below her tibial plateau. She has a smaller eschar on the left anterior leg. She is being using Prisma however she informs [place] today that she is actually allergic to silver, nose this from a previous application at [facility] some years ago 01/24/16; edema is not so well controlled today, I think I reduced her to [place] bilaterally last week. The area which was a scissor injury on her left upper anterior lower leg is fully epithelialized. She only has a small open area remaining on the medial aspect of her left leg. The oval-shaped wound on the right mid lateral leg may be a bit smaller. Debrided of surface slough nonviable subcutaneous tissue. I had changed to collagen 2 weeks ago in an attempt to get this to close 01/31/16 all the patient's wounds on her left leg give healed. We have good edema control with bilateral Profore lites which she has been compliant with. She still has the oval-shaped wound on the right lateral calf allergic even this appears to be slowly improving. The patient has juxtalite stockings at home. She states she thinks she can put these on. She also has external compression pumps at home although I think her compliance with this has not been good in the past. She complains today of edema in her thighs. Tells me she takes 40 mg of Lasix daily 02/21/16; only 1 small wound remains on the lateral aspect of the right calf. She is using Juzo stockings on the left leg although she complains about difficulty in applying them. She has external  compression pumps at home 02/28/16; the small open wound on her right lateral calf is improved in terms of wound area. It appears that she has a wrap injury on the anterior aspect of the upper leg 03/06/16; the small open wound on her right lateral calf has a very small open area remaining. The wrap injury on the anterior aspect of the upper leg also appears better. She arrives today in clinic with a history of dyspnea with minimal exertion starting with the last 2-4 days. She does not describe chest pain. Her son and our intake nurse think she has facial swelling. She has a history of an artificial mitral valve on Coumadin 03/13/16; the small wound on the right lateral calf is no better this week. The superior wrap injury anteriorly requires debridement of surface eschar and nonviable subcutaneous tissue. She has been to her primary doctor with regards to her dyspnea we identified last week. Per the patient's description her Lasix has been increased 03/21/2016 -- patient  of Dr. Dellia Nims who could not keep her appointment yesterday but has come in today with the right lower leg looking good and this is the leg which has been treated in the recent past. However, her left lower extremity is extremely swollen, tender and edematous with redness and discoloration. 03/27/16; there is only 2 small areas remain on the right leg. The edema that was so concerning last week has come down however there is extensive bruising on the lateral left leg medial left foot suggesting that she lost some blood into the leg itself. I checked her hemoglobin on 9/21 was 14.5. Her INR was over 6. Duplex ultrasound was negative for DVT 04/03/16 at this point in time today patient is actually doing substantially better in regard to the wound on the anterior right lower extremity. currently there is no slough or eschar noted and no evidence of erythema, discharge, or local infection. She is exhibiting no signs of systemic  infection. She is tolerating the dressing changes as well as the wrapping at this point in time. 04/24/16; I have not seen Mrs. Bautch for 3 weeks. Apparently she was admitted to hospital for respiratory NIKHITA, MENTZEL. ([id number]) issues/also apparently has had multiple compression fractures and had kyphoplasty. When she was last here she only had one small open area remaining on the left leg she was using juxtalite stockings. Her son is upset about restarting the Coumadin which she blames or the swelling in her legs. She is on Coumadin for prophylaxis with a chronic artificial heart valve. 04/30/16 at this point in time patient has been tolerating the 3 layer compression wrap as well as the calcium alginate. 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 With tthat being said she does have the continued wound of the left lower extremity as well as the right medial lower extremity. The right side is significantly smaller compared to the left but actually is slough covered. fortunately she has no significant tenderness at rest although with manipulation of the right location of the wound this is significantly tender. 05/07/16 for follow-up evaluation today both the patient's wounds appear to be significantly improved in size compared to last week. She is also having less pain at both locations currently. Her pain level at most is related to be a 1-2 out of 10. She does have some discharge but fortunately no evidence of infection at this point.she also continues to tolerate the compression wraps well. 05/14/16; the patient's wound on the left leg actually looks as though it's on its way to closure. She is using collagen to this area. She has a small punched out area over the right medial calf. The cause of this is not really clear it has probably 0.4 cm of depth. She has an IV in her right hand which she states is for IV fluid when she develops low sodiums her potassiums, the etiology of this  is not clear Objective Constitutional Sitting or standing Blood Pressure is within target range for patient.. Pulse regular and within target range for patient.Marland Kitchen Respirations regular, non-labored and within target range.. Temperature is normal and within the target range for the patient.. Patient's appearance is neat and clean. Appears in no acute distress. Well nourished and well developed.. Vitals Time Taken: 1:34 PM, Height: 68 in, Weight: 178 lbs, BMI: 27.1, Temperature: 97.5 F, Pulse: 83 bpm, Respiratory Rate: 16 breaths/min, Blood Pressure: 102/60 mmHg. General Notes: Wound exam; neither one of her wounds required debridement. I continued with the collagen to  the left. The area on her right medial calf they have been using Iodoflex which I think is reasonable. The cause of this wound is a bit obscure Integumentary (Hair, Skin) Wound #6 status is Open. Original cause of wound was Trauma. The wound is located on the Left,Lateral Knee. The wound measures 0.3cm length x 0.3cm width x 0.1cm depth; 0.071cm^2 area and 0.007cm^3 volume. The wound is limited to skin breakdown. There is no tunneling or undermining noted. There is a large amount of serosanguineous drainage noted. The wound margin is distinct with the outline attached to the wound base. There is large (67-100%) red, pink granulation within the wound bed. There is a small (1-33%) amount of necrotic tissue within the wound bed including Eschar and Adherent Slough. The periwound skin appearance exhibited: Localized Edema, Moist, Erythema. The surrounding wound skin color is noted with erythema which is circumferential. Periwound temperature was noted as No Abnormality. Doering, Bernece C. ([id number]) The periwound has tenderness on palpation. Wound #8 status is Open. Original cause of wound was Trauma. The wound is located on the Right,Medial Lower Leg. The wound measures 0.3cm length x 0.3cm width x 0.3cm depth; 0.071cm^2 area  and 0.021cm^3 volume. The wound is limited to skin breakdown. There is no tunneling or undermining noted. There is a large amount of serosanguineous drainage noted. The wound margin is distinct with the outline attached to the wound base. There is medium (34-66%) red granulation within the wound bed. There is a medium (34-66%) amount of necrotic tissue within the wound bed including Eschar and Adherent Slough. The periwound skin appearance exhibited: Localized Edema, Moist. Periwound temperature was noted as No Abnormality. The periwound has tenderness on palpation. 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 Cleanse wound with mild soap and water No tub bath. - give sink or bed baths Wound #8 Right,Medial Lower Leg: Clean wound with Normal Saline. Cleanse wound with mild soap and water No tub bath. - give sink or bed baths Anesthetic: Wound #6 Left,Lateral Knee: Topical Lidocaine 4% cream applied to wound bed prior to debridement - for clinic use Wound #8 Right,Medial Lower Leg: Topical Lidocaine 4% cream applied to wound bed prior to debridement - for clinic use Primary Wound Dressing: Wound #6 Left,Lateral Knee: Promogran Wound #8 Right,Medial Lower Leg: JOURNEI, THOMASSEN. ([id number]) Iodoflex Secondary Dressing: Wound #6 Left,Lateral Knee: Dry Gauze Wound #8 Right,Medial Lower Leg: Dry Gauze Dressing Change Frequency: Wound #6 Left,Lateral Knee: Change dressing every week Wound #8 Right,Medial Lower Leg: Change dressing every  week Follow-up Appointments: Wound #6 Left,Lateral Knee: Return Appointment in 1 week. Wound #8 Right,Medial Lower Leg: Return Appointment in 1 week. Edema Control: Wound #6 Left,Lateral Knee: 3 Layer Compression System - Bilateral Wound #8 Right,Medial Lower Leg: 3 Layer Compression System - Bilateral Additional Orders / Instructions: Wound #6 Left,Lateral Knee: Increase protein intake. Wound #8 Right,Medial Lower Leg: Increase protein intake. #1 I did not alter the current dressings. To the area on the left lateral knee area I continued with the Promogran #22 the area on the right medial calf I continued the Iodoflex. Electronic Signature(s) Signed: 05/15/2016 9:06:13 AM By:  Gretta Cool, RN, BSN, Leisure centre manager, BSN Signed: 05/15/2016 4:57:31 PM By: [name] MD Previous Signature: 05/14/2016 4:42:06 PM Version By: [name] MD Entered By: Gretta Cool RN, BSN, Kim on 05/15/2016 09:06:12 [place], [name] ([id number]) -------------------------------------------------------------------------------- SuperBill Details Patient Name: [name] Date of Service: 05/14/2016 Medical Record Patient Account Number: [id number] Number: Treating RN: [name] 1935/01/12 (80 y.o. Other Clinician: Date of Birth/Sex: Female) Treating Macy Polio Primary Care Physician/Extender: Claudette Laws Physician: Suella Grove in Treatment: 24 Referring Physician: [name] Diagnosis Coding ICD-10 Codes Code Description [phone] Laceration without foreign body, left lower leg, sequela Chronic venous hypertension (idiopathic) with ulcer and inflammation of bilateral lower I87.333 extremity L97.812 Non-pressure chronic ulcer of other part of right lower leg with fat layer exposed I89.0 Lymphedema, not elsewhere classified V74.82 Chronic systolic (congestive) heart failure Facility Procedures CPT4: Description Modifier Quantity Code [id number] BILATERAL: Application of  multi-layer venous compression 1 system; leg (below knee), including ankle and foot. Physician Procedures CPT4: Description Modifier Quantity Code [id number] - WC PHYS LEVEL 2 - EST PT 1 ICD-10 Description Diagnosis L97.812 Non-pressure chronic ulcer of other part of right lower leg with fat layer exposed Electronic Signature(s) Signed: 05/14/2016 4:24:36 PM By: [name] Signed: 05/14/2016 4:42:06 PM By: [name] MD Entered By: [name] on 05/14/2016 15:19:05

## 2016-05-15 NOTE — Progress Notes (Signed)
DARLINDA, [name] ([id number]) Visit Report for 05/14/2016 Arrival Information Details Patient Name: [name], [name]. Date of Service: 05/14/2016 1:30 PM Medical Record Patient Account Number: [id number] Number: Treating RN: [name] 1934-07-06 (81 y.o. Other Clinician: Date of Birth/Sex: Female) Treating [name], [name] Primary Care Physician/Extender: Claudette Laws Physician: Referring Physician: [name] in Treatment: 24 Visit Information History Since Last Visit All ordered tests and consults were completed: No Patient Arrived: Wheel Chair Added or deleted any medications: No Arrival Time: 13:34 Any new allergies or adverse reactions: No Accompanied By: self Had a fall or experienced change in No Transfer Assistance: EasyPivot Patient activities of daily living that may affect Lift risk of falls: Patient Identification Verified: Yes Signs or symptoms of abuse/neglect since last No Secondary Verification Process Yes visito Completed: Hospitalized since last visit: No Patient Requires Transmission- No Pain Present Now: No Based Precautions: Patient Has Alerts: Yes Patient Alerts: Patient on Blood Thinner Dukes Signature(s) Signed: 05/14/2016 4:24:36 PM By: [name] Entered By: [name] on 05/14/2016 13:34:24 [place], [name] ([id number]) -------------------------------------------------------------------------------- Encounter Discharge Information Details Patient Name: Carrie Mulligan C. Date of Service: 05/14/2016 1:30 PM Medical Record Patient Account Number: [id number] Number: Treating RN: [name] 1934/12/11 (81 y.o. Other Clinician: Date of Birth/Sex: Female) Treating [name], [name] Primary Care Physician/Extender: Claudette Laws Physician: Referring Physician: [name] in Treatment: 24 Encounter Discharge Information  Items Discharge Pain Level: 0 Discharge Condition: Stable Ambulatory Status: Wheelchair Discharge Destination: Nursing Home Transportation: Other Accompanied By: caregiver Schedule Follow-up Appointment: Yes Medication Reconciliation completed and provided to Patient/Care Yes Murrel Bertram: Provided on Clinical Summary of Care: 05/14/2016 Form Type Recipient Paper Patient [place] Electronic Signature(s) Signed: 05/14/2016 4:24:36 PM By: [name] Previous Signature: 05/14/2016 2:30:53 PM Version By: Ruthine Dose Entered By: [name] on 05/14/2016 14:35:48 [place]o, [name] ([id number]) -------------------------------------------------------------------------------- Lower Extremity Assessment Details Patient Name: Carrie Cobia, [name] Date of Service: 05/14/2016 1:30 PM Medical Record Patient Account Number: [id number] Number: Treating RN: [name] 1934-09-27 (81 y.o. Other Clinician: Date of Birth/Sex: Female) Treating [name], [name] Primary Care Physician/Extender: Claudette Laws Physician: Referring Physician: [name] in Treatment: 24 Vascular Assessment Pulses: Posterior Tibial Dorsalis Pedis Palpable: [Left:Yes] [Right:Yes] Extremity colors, hair growth, and conditions: Extremity Color: [Left:Mottled] [Right:Mottled] Temperature of Extremity: [Left:Warm] [Right:Warm] Capillary Refill: [Left:< 3 seconds] [Right:< 3 seconds] Toe Nail Assessment Left: Right: Thick: Yes Yes Discolored: Yes Yes Deformed: No No Improper Length and Hygiene: No No Electronic Signature(s) Signed: 05/14/2016 4:24:36 PM By: [name] Entered By: [name] on 05/14/2016 13:37:21 [place], Cletis Loletha Grayer ([id number]) -------------------------------------------------------------------------------- Multi Wound Chart Details Patient Name: Carrie Mulligan C. Date of Service: 05/14/2016 1:30 PM Medical Record Patient Account Number:  [id number] Number: Treating RN: [name] 10/14/1934 (81 y.o. Other Clinician: Date of Birth/Sex: Female) Treating [name], [name] Primary Care Physician/Extender: Claudette Laws Physician: Referring Physician: [name] in Treatment: 24 Vital Signs Height(in): 68 Pulse(bpm): 83 Weight(lbs): 178 Blood Pressure 102/60 (mmHg): Body Mass Index(BMI): 27 Temperature(F): 97.5 Respiratory Rate 16 (breaths/min): Photos: [6:No Photos] [8:No Photos] [N/A:N/A] Wound Location: [6:Left Knee - Lateral] [8:Right Lower Leg - Medial] [N/A:N/A] Wounding Event: [6:Trauma] [8:Trauma] [N/A:N/A] Primary Etiology: [6:Trauma, Other] [8:Trauma, Other] [N/A:N/A] Comorbid History: [6:Cataracts, Arrhythmia, Hypotension, Osteoarthritis] [8:Cataracts, Arrhythmia, Hypotension, Osteoarthritis] [N/A:N/A] Date Acquired: [6:04/06/2016] [8:04/06/2016] [N/A:N/A] Weeks of Treatment: [6:2] [8:2] [N/A:N/A] Wound Status: [6:Open] [8:Open] [N/A:N/A] Measurements L x W x D 0.3x0.3x0.1 [8:0.3x0.3x0.3] [N/A:N/A] (cm) Area (cm) : [6:0.071] [8:0.071] [N/A:N/A]  Volume (cm) : [6:0.007] [8:0.021] [N/A:N/A] % Reduction in Area: [6:98.20%] [8:67.70%] [N/A:N/A] % Reduction in Volume: 98.20% [8:4.50%] [N/A:N/A] Classification: [6:Partial Thickness] [8:Partial Thickness] [N/A:N/A] Exudate Amount: [6:Large] [8:Large] [N/A:N/A] Exudate Type: [6:Serosanguineous] [8:Serosanguineous] [N/A:N/A] Exudate Color: [6:red, brown] [8:red, brown] [N/A:N/A] Wound Margin: [6:Distinct, outline attached] [8:Distinct, outline attached] [N/A:N/A] Granulation Amount: [6:Large (67-100%)] [8:Medium (34-66%)] [N/A:N/A] Granulation Quality: [6:Red, Pink] [8:Red] [N/A:N/A] Necrotic Amount: [6:Small (1-33%)] [8:Medium (34-66%)] [N/A:N/A] Necrotic Tissue: [6:Eschar, Adherent Slough] [8:Eschar, Adherent Slough] [N/A:N/A] Exposed Structures: [6:Fascia: No Fat: No] [8:Fascia: No Fat: No] [N/A:N/A] Tendon: No Tendon:  No Muscle: No Muscle: No Joint: No Joint: No Bone: No Bone: No Limited to Skin Limited to Skin Breakdown Breakdown Epithelialization: None None N/A Periwound Skin Texture: Edema: Yes Edema: Yes N/A Periwound Skin Moist: Yes Moist: Yes N/A Moisture: Periwound Skin Color: Erythema: Yes No Abnormalities Noted N/A Erythema Location: Circumferential N/A N/A Temperature: No Abnormality No Abnormality N/A Tenderness on Yes Yes N/A Palpation: Wound Preparation: Ulcer Cleansing: Other: Ulcer Cleansing: Other: N/A soap and water soap and water Topical Anesthetic Topical Anesthetic Applied: Other: lidocaine Applied: Other: lidocaine 4% 4% Treatment Notes Electronic Signature(s) Signed: 05/14/2016 4:24:36 PM By: [name] Entered By: [name] on 05/14/2016 14:07:52 [place], [name] ([id number]) -------------------------------------------------------------------------------- [place] Details Patient Name: [name] Date of Service: 05/14/2016 1:30 PM Medical Record Patient Account Number: [id number] Number: Treating RN: [name] [birth year] (80 y.o. Other Clinician: Date of Birth/Sex: Female) Treating [name], [name] Primary Care Physician/Extender: Claudette Laws Physician: Referring Physician: [name] in Treatment: 24 Active Inactive Orientation to the Wound Care Program Nursing Diagnoses: Knowledge deficit related to the wound healing center program Goals: Patient/caregiver will verbalize understanding of the [place] Program Date Initiated: 11/22/2015 Goal Status: Active Interventions: Provide education on orientation to the wound center Notes: Venous Leg Ulcer Nursing Diagnoses: Knowledge deficit related to disease process and management Potential for venous Insuffiency (use before diagnosis confirmed) Goals: Patient will maintain optimal edema control Date Initiated:  11/22/2015 Goal Status: Active Patient/caregiver will verbalize understanding of disease process and disease management Date Initiated: 11/22/2015 Goal Status: Active Verify adequate tissue perfusion prior to therapeutic compression application Date Initiated: 11/22/2015 Goal Status: Active Interventions: Assess peripheral edema status every visit. SARGUN, RUMMELL ([id number]) Compression as ordered Provide education on venous insufficiency Treatment Activities: Non-invasive vascular studies : 11/22/2015 Therapeutic compression applied : 11/22/2015 Notes: Wound/Skin Impairment Nursing Diagnoses: Impaired tissue integrity Knowledge deficit related to ulceration/compromised skin integrity Goals: Patient/caregiver will verbalize understanding of skin care regimen Date Initiated: 11/22/2015 Goal Status: Active Ulcer/skin breakdown will have a volume reduction of 30% by week 4 Date Initiated: 11/22/2015 Goal Status: Active Ulcer/skin breakdown will have a volume reduction of 50% by week 8 Date Initiated: 11/22/2015 Goal Status: Active Ulcer/skin breakdown will have a volume reduction of 80% by week 12 Date Initiated: 11/22/2015 Goal Status: Active Ulcer/skin breakdown will heal within 14 weeks Date Initiated: 11/22/2015 Goal Status: Active Interventions: Assess patient/caregiver ability to perform ulcer/skin care regimen upon admission and as needed Assess ulceration(s) every visit Provide education on ulcer and skin care Treatment Activities: Skin care regimen initiated : 11/22/2015 Topical wound management initiated : 11/22/2015 Notes: Electronic Signature(s) Signed: 05/14/2016 4:24:36 PM By: [place], [name] ([id number]) Entered By: [name] on 05/14/2016 14:07:42 [place], [name] ([id number]) -------------------------------------------------------------------------------- Pain Assessment Details Patient Name: Carrie Mulligan C. Date of  Service: 05/14/2016 1:30 PM Medical Record Patient Account Number: [id number] Number: Treating RN: [name] 1934/08/27 (  81 y.o. Other Clinician: Date of Birth/Sex: Female) Treating [name], [name] Primary Care Physician/Extender: Claudette Laws Physician: Referring Physician: [name] in Treatment: 24 Active Problems Location of Pain Severity and Description of Pain Patient Has Paino No Site Locations With Dressing Change: No Pain Management and Medication Current Pain Management: Electronic Signature(s) Signed: 05/14/2016 4:24:36 PM By: [name] Entered By: [name] on 05/14/2016 13:34:30 [place], [name] ([id number]) -------------------------------------------------------------------------------- Patient/Caregiver Education Details Patient Name: [name] Date of Service: 05/14/2016 1:30 PM Medical Record Patient Account Number: [id number] Number: Treating RN: [name] 24-[name]-1935 (80 y.o. Other Clinician: Date of Birth/Gender: Female) Treating [name], [name] Primary Care Physician/Extender: Claudette Laws Physician: Suella Grove in Treatment: 24 Referring Physician: [name] Education Assessment Education Provided To: Patient Education Topics Provided Wound/Skin Impairment: Handouts: Other: change dressing as ordered and do not get dressing wet Methods: Demonstration, Explain/Verbal Responses: State content correctly Electronic Signature(s) Signed: 05/14/2016 4:24:36 PM By: [name] Entered By: [name] on 05/14/2016 14:36:00 [place], Lacie C. ([id number]) -------------------------------------------------------------------------------- Wound Assessment Details Patient Name: Carrie Cobia, Samanthia C. Date of Service: 05/14/2016 1:30 PM Medical Record Patient Account Number: [id number] Number: Treating RN: [name] 06-20-1935 (80 y.o. Other Clinician: Date  of Birth/Sex: Female) Treating [name], [name] Primary Care Physician/Extender: Claudette Laws Physician: Referring Physician: [name] in Treatment: 24 Wound Status Wound Number: 6 Primary Trauma, Other Etiology: Wound Location: Left Knee - Lateral Wound Status: Open Wounding Event: Trauma Comorbid Cataracts, Arrhythmia, Hypotension, Date Acquired: 04/06/2016 History: Osteoarthritis Weeks Of Treatment: 2 Clustered Wound: No Photos Photo Uploaded By: [name] on 05/14/2016 16:18:44 Wound Measurements Length: (cm) 0.3 % Reduction i Width: (cm) 0.3 % Reduction i Depth: (cm) 0.1 Epithelializa Area: (cm) 0.071 Tunneling: Volume: (cm) 0.007 Undermining: n Area: 98.2% n Volume: 98.2% tion: None No No Wound Description Classification: Partial Thickness Wound Margin: Distinct, outline attached Exudate Amount: Large Exudate Type: Serosanguineous Exudate Color: red, brown Foul Odor After Cleansing: No Wound Bed Granulation Amount: Large (67-100%) Exposed Structure Granulation Quality: Red, Pink Fascia Exposed: No Necrotic Amount: Small (1-33%) Fat Layer Exposed: No Necrotic Quality: Eschar, Adherent Slough Tendon Exposed: No Sonnier, Sheyanne C. ([id number]) Muscle Exposed: No Joint Exposed: No Bone Exposed: No Limited to Skin Breakdown Periwound Skin Texture Texture Color No Abnormalities Noted: No No Abnormalities Noted: No Localized Edema: Yes Erythema: Yes Erythema Location: Circumferential Moisture No Abnormalities Noted: No Temperature / Pain Moist: Yes Temperature: No Abnormality Tenderness on Palpation: Yes Wound Preparation Ulcer Cleansing: Other: soap and water, Topical Anesthetic Applied: Other: lidocaine 4%, Treatment Notes Wound #6 (Left, Lateral Knee) 1. Cleansed with: Clean wound with Normal Saline Cleanse wound with antibacterial soap and water 2. Anesthetic Topical Lidocaine 4% cream to wound bed prior to  debridement 3. Peri-wound Care: Moisturizing lotion 4. Dressing Applied: Promogran 5. Secondary Dressing Applied Dry Gauze 7. Secured with Tape 3 Layer Compression System - Bilateral Electronic Signature(s) Signed: 05/14/2016 4:24:36 PM By: [name] Entered By: [name] on 05/14/2016 13:45:46 Raymundo, Alba C. ([id number]) -------------------------------------------------------------------------------- Wound Assessment Details Patient Name: Carrie Cobia, Kyona C. Date of Service: 05/14/2016 1:30 PM Medical Record Patient Account Number: [id number] Number: Treating RN: [name] 1935/03/31 (80 y.o. Other Clinician: Date of Birth/Sex: Female) Treating [name], [name] Primary Care Physician/Extender: Claudette Laws Physician: Referring Physician: [name] in Treatment: 24 Wound Status Wound Number: 8 Primary Trauma, Other Etiology: Wound Location: Right Lower Leg - Medial Wound Status: Open Wounding Event: Trauma Comorbid Cataracts, Arrhythmia, Hypotension, Date  Acquired: 04/06/2016 History: Osteoarthritis Weeks Of Treatment: 2 Clustered Wound: No Photos Photo Uploaded By: [name] on 05/14/2016 16:19:13 Wound Measurements Length: (cm) 0.3 % Reduction i Width: (cm) 0.3 % Reduction i Depth: (cm) 0.3 Epithelializa Area: (cm) 0.071 Tunneling: Volume: (cm) 0.021 Undermining: n Area: 67.7% n Volume: 4.5% tion: None No No Wound Description Classification: Partial Thickness Wound Margin: Distinct, outline attached Exudate Amount: Large Exudate Type: Serosanguineous Exudate Color: red, brown Foul Odor After Cleansing: No Wound Bed Granulation Amount: Medium (34-66%) Exposed Structure Granulation Quality: Red Fascia Exposed: No Necrotic Amount: Medium (34-66%) Fat Layer Exposed: No Necrotic Quality: Eschar, Adherent Slough Tendon Exposed: No [place], Channel C. ([id number]) Muscle Exposed: No Joint  Exposed: No Bone Exposed: No Limited to Skin Breakdown Periwound Skin Texture Texture Color No Abnormalities Noted: No No Abnormalities Noted: No Localized Edema: Yes Temperature / Pain Moisture Temperature: No Abnormality No Abnormalities Noted: No Tenderness on Palpation: Yes Moist: Yes Wound Preparation Ulcer Cleansing: Other: soap and water, Topical Anesthetic Applied: Other: lidocaine 4%, Treatment Notes Wound #8 (Right, Medial Lower Leg) 1. Cleansed with: Clean wound with Normal Saline Cleanse wound with antibacterial soap and water 2. Anesthetic Topical Lidocaine 4% cream to wound bed prior to debridement 3. Peri-wound Care: Moisturizing lotion 4. Dressing Applied: Iodoflex 5. Secondary Dressing Applied Dry Gauze 7. Secured with Tape 3 Layer Compression System - Bilateral Electronic Signature(s) Signed: 05/14/2016 4:24:36 PM By: [name] Entered By: [name] on 05/14/2016 13:46:19 [place], [name] ([id number]) -------------------------------------------------------------------------------- Vitals Details Patient Name: Carrie Mulligan C. Date of Service: 05/14/2016 1:30 PM Medical Record Patient Account Number: [id number] Number: Treating RN: [name] 1934/11/18 (80 y.o. Other Clinician: Date of Birth/Sex: Female) Treating [name], [name] Primary Care Physician/Extender: Claudette Laws Physician: Referring Physician: [name] in Treatment: 24 Vital Signs Time Taken: 13:34 Temperature (F): 97.5 Height (in): 68 Pulse (bpm): 83 Weight (lbs): 178 Respiratory Rate (breaths/min): 16 Body Mass Index (BMI): 27.1 Blood Pressure (mmHg): 102/60 Reference Range: 80 - 120 mg / dl Electronic Signature(s) Signed: 05/14/2016 4:24:36 PM By: [name] Entered By: [name] on 05/14/2016 13:36:13

## 2016-05-16 ENCOUNTER — Non-Acute Institutional Stay (SKILLED_NURSING_FACILITY): Payer: Medicare Other | Admitting: Gerontology

## 2016-05-16 DIAGNOSIS — R791 Abnormal coagulation profile: Secondary | ICD-10-CM

## 2016-05-16 DIAGNOSIS — Z5181 Encounter for therapeutic drug level monitoring: Secondary | ICD-10-CM | POA: Diagnosis not present

## 2016-05-16 LAB — COMPREHENSIVE METABOLIC PANEL
ALBUMIN: 3.9 g/dL (ref 3.5–5.0)
ALK PHOS: 149 U/L — AB (ref 38–126)
ALT: 16 U/L (ref 14–54)
AST: 22 U/L (ref 15–41)
Anion gap: 13 (ref 5–15)
BILIRUBIN TOTAL: 0.7 mg/dL (ref 0.3–1.2)
BUN: 74 mg/dL — AB (ref 6–20)
CALCIUM: 9.3 mg/dL (ref 8.9–10.3)
CO2: 29 mmol/L (ref 22–32)
CREATININE: 2.41 mg/dL — AB (ref 0.44–1.00)
Chloride: 88 mmol/L — ABNORMAL LOW (ref 101–111)
GFR calc Af Amer: 21 mL/min — ABNORMAL LOW (ref >60)
GFR, EST NON AFRICAN AMERICAN: 18 mL/min — AB (ref >60)
GLUCOSE: 105 mg/dL — AB (ref 65–99)
POTASSIUM: 2.9 mmol/L — AB (ref 3.5–5.1)
Sodium: 130 mmol/L — ABNORMAL LOW (ref 135–145)
TOTAL PROTEIN: 6.8 g/dL (ref 6.5–8.1)

## 2016-05-16 LAB — PROTIME-INR: INR: >10

## 2016-05-16 LAB — CBC WITH DIFFERENTIAL/PLATELET
BASOS ABS: 0.1 10*3/uL (ref 0–0.1)
Basophils Relative: 1 %
Eosinophils Absolute: 0.1 10*3/uL (ref 0–0.7)
Eosinophils Relative: 2 %
HEMATOCRIT: 43.5 % (ref 35.0–47.0)
HEMOGLOBIN: 14.5 g/dL (ref 12.0–16.0)
LYMPHS PCT: 12 %
Lymphs Abs: 0.7 10*3/uL — ABNORMAL LOW (ref 1.0–3.6)
MCH: 27.4 pg (ref 26.0–34.0)
MCHC: 33.4 g/dL (ref 32.0–36.0)
MCV: 82.1 fL (ref 80.0–100.0)
MONO ABS: 0.8 10*3/uL (ref 0.2–0.9)
MONOS PCT: 13 %
NEUTROS ABS: 4.1 10*3/uL (ref 1.4–6.5)
NEUTROS PCT: 72 %
Platelets: 193 10*3/uL (ref 150–440)
RBC: 5.29 MIL/uL — ABNORMAL HIGH (ref 3.80–5.20)
RDW: 19 % — AB (ref 11.5–14.5)
WBC: 5.7 10*3/uL (ref 3.6–11.0)

## 2016-05-16 LAB — URINE CULTURE

## 2016-05-17 LAB — COMPREHENSIVE METABOLIC PANEL
ALT: 18 U/L (ref 14–54)
AST: 24 U/L (ref 15–41)
Albumin: 4.2 g/dL (ref 3.5–5.0)
Alkaline Phosphatase: 150 U/L — ABNORMAL HIGH (ref 38–126)
Anion gap: 10 (ref 5–15)
BILIRUBIN TOTAL: 1.1 mg/dL (ref 0.3–1.2)
BUN: 75 mg/dL — AB (ref 6–20)
CALCIUM: 9.6 mg/dL (ref 8.9–10.3)
CO2: 33 mmol/L — ABNORMAL HIGH (ref 22–32)
CREATININE: 2.41 mg/dL — AB (ref 0.44–1.00)
Chloride: 87 mmol/L — ABNORMAL LOW (ref 101–111)
GFR, EST AFRICAN AMERICAN: 21 mL/min — AB (ref >60)
GFR, EST NON AFRICAN AMERICAN: 18 mL/min — AB (ref >60)
Glucose, Bld: 100 mg/dL — ABNORMAL HIGH (ref 65–99)
Potassium: 4.2 mmol/L (ref 3.5–5.1)
Sodium: 130 mmol/L — ABNORMAL LOW (ref 135–145)
TOTAL PROTEIN: 7.3 g/dL (ref 6.5–8.1)

## 2016-05-17 LAB — CBC
HEMATOCRIT: 43.9 % (ref 35.0–47.0)
HEMOGLOBIN: 14.8 g/dL (ref 12.0–16.0)
MCH: 27.7 pg (ref 26.0–34.0)
MCHC: 33.6 g/dL (ref 32.0–36.0)
MCV: 82.3 fL (ref 80.0–100.0)
Platelets: 193 10*3/uL (ref 150–440)
RBC: 5.34 MIL/uL — ABNORMAL HIGH (ref 3.80–5.20)
RDW: 18.9 % — AB (ref 11.5–14.5)
WBC: 4.8 10*3/uL (ref 3.6–11.0)

## 2016-05-17 LAB — PROTIME-INR
INR: 1.15
Prothrombin Time: 14.8 seconds (ref 11.4–15.2)

## 2016-05-17 NOTE — Progress Notes (Signed)
Location:      Place of Service:  SNF (31) Provider:  Toni Arthurs, NP-C  Rusty Aus, MD  Patient Care Team: Rusty Aus, MD as PCP - General (Internal Medicine) Robert Bellow, MD as Consulting Physician (General Surgery)  Extended Emergency Contact Information Primary Emergency Contact: [name],[name] Address: [address]          [place], [place] [address] [name] of [place] Phone: [phone] Relation: Daughter Secondary Emergency Contact: [name] [place] of [place] Mobile Phone: [phone] Relation: Son  Code Status:  dnr Goals of care: Advanced Directive information Advanced Directives 04/10/2016  Does patient have an advance directive? No  Would patient like information on creating an advanced directive? No - patient declined information     Chief Complaint  Patient presents with  . Acute Visit  . Medication Management    HPI:  Pt is a 81 y.o. female seen today for an acute visit for intractable pain and management of Coumadin (Warfarin) for VTE Prophylaxis in the setting of Chronic A-Fib and Mitral Valve Replacement. Pt has been complaint with medication regimen and is aware of dietary modifications needed. Pt is aware of importance of continued compliance with medication and testing. Pt has not displayed any adverse effects related to anticoagulant therapy such as unexplained or excessive bleeding, bruising, hematuria, hematemesis, melena. Heart rate is controlled. Pt also c/o intractable pain in her back r/t the compression fractures. She was on Oxycodone, but that made her too sleepy. Pt requests something more mild. The pain is inhibiting progress with PT and inhibiting sleep. Average 8/10. Vital signs are stable.   Past Medical History:  Diagnosis Date  . Arthritis    "back, hands, knees" (04/10/2016)  . Chronic atrial fibrillation ([place])    a. On Coumadin  . Chronic back pain   . Chronic combined systolic (congestive)  and diastolic (congestive) heart failure    a. EF 25% by echo in 08/2015 b. RHC in 08/2015 showed normal filling pressures  . Compression fracture    "several; all in my back" (04/10/2016)  . GERD (gastroesophageal reflux disease)   . Heart murmur   . Hypertension   . S/P MVR (mitral valve replacement)    a. MVR 1994 b. redo MVR in 08/2014 - on Coumadin  . Shortness of breath dyspnea    Past Surgical History:  Procedure Laterality Date  . BREAST LUMPECTOMY Right 2005?   benign lump excision  . CARDIAC CATHETERIZATION    . [place], 2016   "MVR; MVR"  . CATARACT EXTRACTION W/PHACO Right 02/20/2015   Procedure: CATARACT EXTRACTION PHACO AND INTRAOCULAR LENS PLACEMENT (IOC);  Surgeon: Estill Cotta, MD;  Location: [facility] ORS;  Service: Ophthalmology;  Laterality: Right;  US01:31.2 AP 25.3% CDE40.13 Fluid lot # [id number]  . ELECTROPHYSIOLOGIC STUDY N/A 10/09/2015   Procedure: CARDIOVERSION;  Surgeon: Wellington Hampshire, MD;  Location: [facility] ORS;  Service: Cardiovascular;  Laterality: N/A;  . IR GENERIC HISTORICAL  04/01/2016   IR RADIOLOGIST EVAL & MGMT 04/01/2016 [facility]  . IR GENERIC HISTORICAL  04/15/2016   IR VERTEBROPLASTY CERV/THOR BX INC UNI/BIL INC/INJECT/IMAGING 04/15/2016 Luanne Bras, MD [facility]  . IR GENERIC HISTORICAL  04/15/2016   IR VERTEBROPLASTY EA ADDL (T&LS) BX INC UNI/BIL INC INJECT/IMAGING 04/15/2016 Luanne Bras, MD [facility]  . IR GENERIC HISTORICAL  04/15/2016   IR VERTEBROPLASTY EA ADDL (T&LS) BX INC UNI/BIL INC INJECT/IMAGING 04/15/2016 Luanne Bras, MD [facility]  .  IRRIGATION AND DEBRIDEMENT HEMATOMA Left 07/05/2015   Procedure: IRRIGATION AND DEBRIDEMENT HEMATOMA;  Surgeon: Robert Bellow, MD;  Location: [facility] ORS;  Service: General;  Laterality: Left;  . pyloric stenosis  07/1937  . US ECHOCARDIOGRAPHY      Allergies  Allergen Reactions  . Ace Inhibitors     Other reaction(s): Cough  . Hydrocodone  Itching and Nausea Only  . Mercury     Other reaction(s): Unknown  . Nickel   . Silver Dermatitis    Severe itching  . Cephalexin Rash  . Clindamycin/Lincomycin Rash  . Penicillins Rash    Has patient had a PCN reaction causing immediate rash, facial/tongue/throat swelling, SOB or lightheadedness with hypotension: YES Has patient had a PCN reaction causing severe rash involving mucus membranes or skin necrosis: NO Has patient had a PCN reaction that required hospitalization NO Has patient had a PCN reaction occurring within the last 10 years: NO If all of the above answers are "NO", then may proceed with Cephalosporin use.      Medication List       Accurate as of 05/01/16 11:59 PM. Always use your most recent med list.          acetaminophen 500 MG tablet Commonly known as:  TYLENOL Take 500 mg by mouth every 6 (six) hours as needed for mild pain, moderate pain, fever or headache.   aspirin EC 81 MG tablet Take 81 mg by mouth daily.   feeding supplement (ENSURE ENLIVE) Liqd Take 237 mLs by mouth 2 (two) times daily between meals.   furosemide 40 MG tablet Commonly known as:  LASIX Take 40 mg by mouth 2 (two) times daily.   liver oil-zinc oxide 40 % ointment Commonly known as:  DESITIN Apply 1 application topically 3 (three) times daily. Apply to bed sore   metoprolol succinate 25 MG 24 hr tablet Commonly known as:  TOPROL-XL Take 1 tablet (25 mg total) by mouth daily.   oxyCODONE 5 MG immediate release tablet Commonly known as:  Oxy IR/ROXICODONE Take 1 tablet (5 mg total) by mouth every 4 (four) hours.   pantoprazole 40 MG tablet Commonly known as:  PROTONIX Take 40 mg by mouth daily.   polyethylene glycol packet Commonly known as:  MIRALAX / GLYCOLAX Take 17 g by mouth 2 (two) times daily.   senna-docusate 8.6-50 MG tablet Commonly known as:  Senokot-S Take 1 tablet by mouth 2 (two) times daily.   spironolactone 25 MG tablet Commonly known as:   ALDACTONE Take 25 mg by mouth 2 (two) times daily.   warfarin 5 MG tablet Commonly known as:  COUMADIN Take 1 tablet (5 mg total) by mouth daily.       Review of Systems  Constitutional: Negative for activity change, appetite change, chills, diaphoresis and fever.  Respiratory: Negative for apnea, cough, choking, chest tightness, shortness of breath and wheezing.   Cardiovascular: Negative for chest pain, palpitations and leg swelling.  Gastrointestinal: Negative for abdominal distention, abdominal pain, anal bleeding, blood in stool, constipation, diarrhea and nausea.  Genitourinary: Negative for difficulty urinating, dysuria, frequency, hematuria, urgency and vaginal bleeding.  Musculoskeletal: Positive for arthralgias (typical arthritis), back pain and myalgias. Negative for gait problem.  Skin: Negative for color change, pallor, rash and wound.  Neurological: Negative for dizziness, tremors, syncope, speech difficulty, weakness, numbness and headaches.  All other systems reviewed and are negative.    There is no immunization history on file for this patient. Pertinent  Health  Maintenance Due  Topic Date Due  . DEXA SCAN  06/28/2002  . PNA vac Low Risk Adult (1 of 2 - PCV13) 06/28/2002  . INFLUENZA VACCINE  01/30/2016   Fall Risk  09/25/2015  Falls in the past year? No   Functional Status Survey:    Vitals:   05/01/16 0500  BP: (!) 121/56  Pulse: (!) 58  Resp: 16  Temp: 97.8 F (36.6 C)  SpO2: 92%  Weight: 146 lb 1.6 oz (66.3 kg)   Body mass index is 23.58 kg/m. Physical Exam  Constitutional: She is oriented to person, place, and time. Vital signs are normal. She appears well-developed and well-nourished. She is active and cooperative. She does not appear ill. No distress.  HENT:  Head: Normocephalic and atraumatic.  Mouth/Throat: Uvula is midline, oropharynx is clear and moist and mucous membranes are normal. Mucous membranes are not pale, not dry and not  cyanotic.  Eyes: Conjunctivae, EOM and lids are normal. Pupils are equal, round, and reactive to light.  Neck: Trachea normal, normal range of motion and full passive range of motion without pain. Neck supple. No JVD present. No tracheal deviation, no edema and no erythema present. No thyromegaly present.  Cardiovascular: Normal rate, regular rhythm, normal heart sounds, intact distal pulses and normal pulses.  Exam reveals no gallop, no distant heart sounds and no friction rub.   No murmur heard. Pulmonary/Chest: Effort normal and breath sounds normal. No accessory muscle usage. No respiratory distress. She has no wheezes. She has no rales. She exhibits no tenderness.  Abdominal: Normal appearance and bowel sounds are normal. She exhibits no distension and no ascites. There is no tenderness.  Musculoskeletal: Normal range of motion. She exhibits no edema or tenderness.  Expected osteoarthritis, stiffness  Neurological: She is alert and oriented to person, place, and time. She has normal strength.  Skin: Skin is warm, dry and intact. No rash noted. She is not diaphoretic. No cyanosis or erythema. No pallor. Nails show no clubbing.  Psychiatric: She has a normal mood and affect. Her speech is normal and behavior is normal. Judgment and thought content normal. Cognition and memory are normal.  Nursing note and vitals reviewed.   Labs reviewed:  Recent Labs  05/14/16 0716 05/16/16 0610 05/17/16 0650  NA 131* 130* 130*  K 3.1* 2.9* 4.2  CL 91* 88* 87*  CO2 26 29 33*  GLUCOSE 103* 105* 100*  BUN 65* 74* 75*  CREATININE 2.89* 2.41* 2.41*  CALCIUM 9.5 9.3 9.6    Recent Labs  05/14/16 0716 05/16/16 0610 05/17/16 0650  AST _0 ALT _1 ALKPHOS 153* 149* 150*  BILITOT 0.9 0.7 1.1  PROT 7.0 6.8 7.3  ALBUMIN 3.9 3.9 4.2    Recent Labs  05/09/16 1135 05/14/16 0716 05/16/16 0610 05/17/16 0650  WBC 6.8 7.6 5.7 4.8  NEUTROABS 5.6 5.7 4.1  --   HGB 15.0 15.3 14.5 14.8    HCT 46.2 45.6 43.5 43.9  MCV 82.3 82.7 82.1 82.3  PLT 250 216 193 193   Lab Results  Component Value Date   TSH 2.239 04/13/2016   No results found for: HGBA1C No results found for: CHOL, HDL, LDLCALC, LDLDIRECT, TRIG, CHOLHDL  Significant Diagnostic Results in last 30 days:  No results found.  Assessment/Plan 1. Intractable pain  Tramadol 50 mg po TID scheduled   Tramadol 50 mg po Q 4 hours prn pain # 120, no refills  Effexor 37.5 mg  po x 7 days, then  Effexor 75 mg po daily for chronic pain  Continue complementary therapies including:  PT/OT  Restorative Nursing  Ice pack to site QID and prn  Diversional activities  Repositioning Q2 hours and prn  Scheduled Tylenol 650 mg po QID  2. Encounter for therapeutic drug monitoring  Hold Coumadin x 2 days for supratherapeutic INR of 4.9, then when INR in therapeutic range.Marland Kitchen  Resume Coumadin 4.5 mg PO Q Day at 1700 for Atrial Fibrillation  Recheck INR 2 days  Monitor for s/s of bleeding, bruising, hematuria, hematemesis, melena  Check INR 3 days after initiation of antibiotic, when applicable.  Family/ staff Communication:   Total Time:  Documentation:  Face to Face:  Family/Phone:   Labs/tests ordered:  PT/INR, CBC, Met C  Medication list reviewed and assessed for continued appropriateness.  Vikki Ports, NP-C Geriatrics [facility] [phone], [place] [address] Cell Phone (Mon-Fri 8am-5pm):  [phone] On Call:  [phone] & follow prompts after 5pm & weekends Office Phone:  [phone] Office Fax:  [phone]

## 2016-05-17 NOTE — Progress Notes (Signed)
Location:      Place of Service:  SNF (31) Provider:  Toni Arthurs, NP-C  Rusty Aus, MD  Patient Care Team: Rusty Aus, MD as PCP - General (Internal Medicine) Robert Bellow, MD as Consulting Physician (General Surgery)  Extended Emergency Contact Information Primary Emergency Contact: [name],[name] Address: [address]          [place], [place] [address] [name] of [place] Phone: [phone] Relation: Daughter Secondary Emergency Contact: [name] [place] of [place] Mobile Phone: [phone] Relation: Son  Code Status:  DO NOT RESUSCITATE Goals of care: Advanced Directive information Advanced Directives 04/10/2016  Does patient have an advance directive? No  Would patient like information on creating an advanced directive? No - patient declined information     Chief Complaint  Patient presents with  . Medication Management    HPI:  Pt is a 81 y.o. female seen today for an acute visit for Supratherapeutic INR. Patient's Coumadin was held over the weekend due to INR being very minimally elevated. INR goal is 2.5-3.5 due to mechanical valve replacement area upon recheck, INR was 1.52. Extra dose of Coumadin was given an order to raise INR to therapeutic level, decreasing risk for clots. Upon recheck of INR this morning, INR reading was greater than 10. Lab tech says she ran a test twice to make sure. Order for vitamin K to be given immediately as soon as the lab result was called to me. Patient displays no signs of bleeding. Patient does have a bruise on the back of her left hand, where she says she hit it against the toilet paper holder in the bathroom. Patient reports she is feeling okay. Updated patient on the INR results and for the plan of correction. Patient denies any complaints. Vital signs stable   Past Medical History:  Diagnosis Date  . Arthritis    "back, hands, knees" (04/10/2016)  . Chronic atrial fibrillation ([place])    a.  On Coumadin  . Chronic back pain   . Chronic combined systolic (congestive) and diastolic (congestive) heart failure    a. EF 25% by echo in 08/2015 b. RHC in 08/2015 showed normal filling pressures  . Compression fracture    "several; all in my back" (04/10/2016)  . GERD (gastroesophageal reflux disease)   . Heart murmur   . Hypertension   . S/P MVR (mitral valve replacement)    a. MVR 1994 b. redo MVR in 08/2014 - on Coumadin  . Shortness of breath dyspnea    Past Surgical History:  Procedure Laterality Date  . BREAST LUMPECTOMY Right 2005?   benign lump excision  . CARDIAC CATHETERIZATION    . [place], 2016   "MVR; MVR"  . CATARACT EXTRACTION W/PHACO Right 02/20/2015   Procedure: CATARACT EXTRACTION PHACO AND INTRAOCULAR LENS PLACEMENT (IOC);  Surgeon: Estill Cotta, MD;  Location: [facility] ORS;  Service: Ophthalmology;  Laterality: Right;  US01:31.2 AP 25.3% CDE40.13 Fluid lot # [id number]  . ELECTROPHYSIOLOGIC STUDY N/A 10/09/2015   Procedure: CARDIOVERSION;  Surgeon: Wellington Hampshire, MD;  Location: [facility] ORS;  Service: Cardiovascular;  Laterality: N/A;  . IR GENERIC HISTORICAL  04/01/2016   IR RADIOLOGIST EVAL & MGMT 04/01/2016 [facility]  . IR GENERIC HISTORICAL  04/15/2016   IR VERTEBROPLASTY CERV/THOR BX INC UNI/BIL INC/INJECT/IMAGING 04/15/2016 Luanne Bras, MD [facility]  . IR GENERIC HISTORICAL  04/15/2016   IR VERTEBROPLASTY EA ADDL (T&LS) BX INC UNI/BIL INC INJECT/IMAGING 04/15/2016 Sanjeev  Estanislado Pandy, MD [facility]  . IR GENERIC HISTORICAL  04/15/2016   IR VERTEBROPLASTY EA ADDL (T&LS) BX INC UNI/BIL INC INJECT/IMAGING 04/15/2016 Luanne Bras, MD [facility]  . IRRIGATION AND DEBRIDEMENT HEMATOMA Left 07/05/2015   Procedure: IRRIGATION AND DEBRIDEMENT HEMATOMA;  Surgeon: Robert Bellow, MD;  Location: [facility] ORS;  Service: General;  Laterality: Left;  . pyloric stenosis  07/1937  . US ECHOCARDIOGRAPHY      Allergies    Allergen Reactions  . Ace Inhibitors     Other reaction(s): Cough  . Hydrocodone Itching and Nausea Only  . Mercury     Other reaction(s): Unknown  . Nickel   . Silver Dermatitis    Severe itching  . Cephalexin Rash  . Clindamycin/Lincomycin Rash  . Penicillins Rash    Has patient had a PCN reaction causing immediate rash, facial/tongue/throat swelling, SOB or lightheadedness with hypotension: YES Has patient had a PCN reaction causing severe rash involving mucus membranes or skin necrosis: NO Has patient had a PCN reaction that required hospitalization NO Has patient had a PCN reaction occurring within the last 10 years: NO If all of the above answers are "NO", then may proceed with Cephalosporin use.      Medication List       Accurate as of 05/16/16 11:59 PM. Always use your most recent med list.          acetaminophen 500 MG tablet Commonly known as:  TYLENOL Take 500 mg by mouth every 6 (six) hours as needed for mild pain, moderate pain, fever or headache.   aspirin EC 81 MG tablet Take 81 mg by mouth daily.   feeding supplement (ENSURE ENLIVE) Liqd Take 237 mLs by mouth 2 (two) times daily between meals.   furosemide 40 MG tablet Commonly known as:  LASIX Take 40 mg by mouth 2 (two) times daily.   liver oil-zinc oxide 40 % ointment Commonly known as:  DESITIN Apply 1 application topically 3 (three) times daily. Apply to bed sore   metoprolol succinate 25 MG 24 hr tablet Commonly known as:  TOPROL-XL Take 1 tablet (25 mg total) by mouth daily.   oxyCODONE 5 MG immediate release tablet Commonly known as:  Oxy IR/ROXICODONE Take 1 tablet (5 mg total) by mouth every 4 (four) hours.   pantoprazole 40 MG tablet Commonly known as:  PROTONIX Take 40 mg by mouth daily.   polyethylene glycol packet Commonly known as:  MIRALAX / GLYCOLAX Take 17 g by mouth 2 (two) times daily.   senna-docusate 8.6-50 MG tablet Commonly known as:  Senokot-S Take 1 tablet by  mouth 2 (two) times daily.   spironolactone 25 MG tablet Commonly known as:  ALDACTONE Take 25 mg by mouth 2 (two) times daily.   warfarin 5 MG tablet Commonly known as:  COUMADIN Take 1 tablet (5 mg total) by mouth daily.       Review of Systems  Constitutional: Negative for activity change, appetite change, chills, diaphoresis and fever.  Respiratory: Negative for apnea, cough, choking, chest tightness, shortness of breath and wheezing.   Cardiovascular: Negative for chest pain, palpitations and leg swelling.  Gastrointestinal: Negative for abdominal distention, abdominal pain, anal bleeding, blood in stool, constipation, diarrhea and nausea.  Genitourinary: Negative for difficulty urinating, dysuria, frequency, hematuria, urgency and vaginal bleeding.  Musculoskeletal: Positive for arthralgias (typical arthritis), back pain and myalgias. Negative for gait problem.  Skin: Negative for color change, pallor, rash and wound.  Neurological: Negative for dizziness,  tremors, syncope, speech difficulty, weakness, numbness and headaches.  All other systems reviewed and are negative.    There is no immunization history on file for this patient. Pertinent  Health Maintenance Due  Topic Date Due  . DEXA SCAN  06/28/2002  . PNA vac Low Risk Adult (1 of 2 - PCV13) 06/28/2002  . INFLUENZA VACCINE  01/30/2016   Fall Risk  09/25/2015  Falls in the past year? No   Functional Status Survey:    Vitals:   05/16/16 1537  BP: 121/69  Pulse: 72  Resp: 16  Temp: 97.5 F (36.4 C)  SpO2: 94%  Weight: 136 lb 12.8 oz (62.1 kg)   Body mass index is 22.08 kg/m. Physical Exam  Constitutional: She is oriented to person, place, and time. Vital signs are normal. She appears well-developed and well-nourished. She is active and cooperative. She does not appear ill. No distress.  HENT:  Head: Normocephalic and atraumatic.  Mouth/Throat: Uvula is midline, oropharynx is clear and moist and mucous  membranes are normal. Mucous membranes are not pale, not dry and not cyanotic.  Eyes: Conjunctivae, EOM and lids are normal. Pupils are equal, round, and reactive to light.  Neck: Trachea normal, normal range of motion and full passive range of motion without pain. Neck supple. No JVD present. No tracheal deviation, no edema and no erythema present. No thyromegaly present.  Cardiovascular: Normal rate, regular rhythm, normal heart sounds, intact distal pulses and normal pulses.  Exam reveals no gallop, no distant heart sounds and no friction rub.   No murmur heard. Pulmonary/Chest: Effort normal and breath sounds normal. No accessory muscle usage. No respiratory distress. She has no wheezes. She has no rales. She exhibits no tenderness.  Abdominal: Normal appearance and bowel sounds are normal. She exhibits no distension and no ascites. There is no tenderness.  Musculoskeletal: Normal range of motion. She exhibits no edema.       Thoracic back: She exhibits tenderness.  Expected osteoarthritis, stiffness  Neurological: She is alert and oriented to person, place, and time. She has normal strength. No cranial nerve deficit or sensory deficit.  Skin: Skin is warm, dry and intact. No rash noted. She is not diaphoretic. No cyanosis or erythema. No pallor. Nails show no clubbing.  Psychiatric: She has a normal mood and affect. Her speech is normal and behavior is normal. Judgment and thought content normal. Cognition and memory are normal.  Nursing note and vitals reviewed.   Labs reviewed:  Recent Labs  05/14/16 0716 05/16/16 0610 05/17/16 0650  NA 131* 130* 130*  K 3.1* 2.9* 4.2  CL 91* 88* 87*  CO2 26 29 33*  GLUCOSE 103* 105* 100*  BUN 65* 74* 75*  CREATININE 2.89* 2.41* 2.41*  CALCIUM 9.5 9.3 9.6    Recent Labs  05/14/16 0716 05/16/16 0610 05/17/16 0650  AST 22 22 24   ALT 16 16 18   ALKPHOS 153* 149* 150*  BILITOT 0.9 0.7 1.1  PROT 7.0 6.8 7.3  ALBUMIN 3.9 3.9 4.2     Recent Labs  05/09/16 1135 05/14/16 0716 05/16/16 0610 05/17/16 0650  WBC 6.8 7.6 5.7 4.8  NEUTROABS 5.6 5.7 4.1  --   HGB 15.0 15.3 14.5 14.8  HCT 46.2 45.6 43.5 43.9  MCV 82.3 82.7 82.1 82.3  PLT 250 216 193 193   Lab Results  Component Value Date   TSH 2.239 04/13/2016   No results found for: HGBA1C No results found for: CHOL, HDL, LDLCALC, LDLDIRECT,  TRIG, CHOLHDL  Significant Diagnostic Results in last 30 days:  No results found.  Assessment/Plan 1. Encounter for therapeutic drug monitoring 2. Supratherapeutic INR  [place] 5 mg po x 1 stat  Hold Coumadin tonight  Recheck labs in the morning  Monitor closely for bleeding  Family/ staff Communication:   Total Time:  Documentation:  Face to Face:  Family/Phone:   Labs/tests ordered:  CBC, met C, PT/INR  Medication list reviewed and assessed for continued appropriateness.  Vikki Ports, NP-C Geriatrics [facility] [phone], [place] [address] Cell Phone (Mon-Fri 8am-5pm):  [phone] On Call:  [phone] & follow prompts after 5pm & weekends Office Phone:  [phone] Office Fax:  [phone]

## 2016-05-17 NOTE — Progress Notes (Signed)
Location:      Place of Service:  SNF (31) Provider:  Toni Arthurs, NP-C  Rusty Aus, MD  Patient Care Team: Rusty Aus, MD as PCP - General (Internal Medicine) Robert Bellow, MD as Consulting Physician (General Surgery)  Extended Emergency Contact Information Primary Emergency Contact: [name],[name] Address: [address]          [place], [place] [address] [name] of [place] Phone: [phone] Relation: Daughter Secondary Emergency Contact: [name] [place] of [place] Mobile Phone: [phone] Relation: Son  Code Status:  dnr Goals of care: Advanced Directive information Advanced Directives 04/10/2016  Does patient have an advance directive? No  Would patient like information on creating an advanced directive? No - patient declined information     Chief Complaint  Patient presents with  . Acute Visit    HPI:  Pt is a 81 y.o. [name] seen today for an acute visit for Orthostatic hypotension. Patient endorses some dizziness, small amount with sitting and worse with standing. No dizziness when she turns her head. Patient endorses that she has a headache and nausea with dizziness. No vomiting. She does sleep well at night. Poor appetite. Tries to drink water but may not be enough. Patient reports pain is a 5/10, it is better with the oxycodone. Vital signs stable. Orthostatic hypotension is minimal. No other complaints at this time.   Past Medical History:  Diagnosis Date  . Arthritis    "back, hands, knees" (04/10/2016)  . Chronic atrial fibrillation ([place])    a. On Coumadin  . Chronic back pain   . Chronic combined systolic (congestive) and diastolic (congestive) heart failure    a. EF 25% by echo in 08/2015 b. RHC in 08/2015 showed normal filling pressures  . Compression fracture    "several; all in my back" (04/10/2016)  . GERD (gastroesophageal reflux disease)   . Heart murmur   . Hypertension   . S/P MVR (mitral valve  replacement)    a. MVR 1994 b. redo MVR in 08/2014 - on Coumadin  . Shortness of breath dyspnea    Past Surgical History:  Procedure Laterality Date  . BREAST LUMPECTOMY Right 2005?   benign lump excision  . CARDIAC CATHETERIZATION    . [place], 2016   "MVR; MVR"  . CATARACT EXTRACTION W/PHACO Right 02/20/2015   Procedure: CATARACT EXTRACTION PHACO AND INTRAOCULAR LENS PLACEMENT (IOC);  Surgeon: Estill Cotta, MD;  Location: [facility] OR[facility];  Service: Ophthalmology;  Laterality: Right;  US01:31.2 AP 25.3% CDE40.13 Fluid lot # [id number]  . ELECTROPHYSIOLOGIC STUDY N/A 10/09/2015   Procedure: CARDIOVERSION;  Surgeon: Wellington Hampshire, MD;  Location: [facility];  Service: Cardiovascular;  Laterality: N/A;  . IR GENERIC HISTORICAL  04/01/2016   IR RADIOLOGIST EVAL & MGMT 04/01/2016 [facility]  . IR GENERIC HISTORICAL  04/15/2016   IR VERTEBROPLASTY CERV/THOR BX INC UNI/BIL INC/INJECT/IMAGING 04/15/2016 Luanne Bras, MD [facility]  . IR GENERIC HISTORICAL  04/15/2016   IR VERTEBROPLASTY EA ADDL (T&LS) BX INC UNI/BIL INC INJECT/IMAGING 04/15/2016 Luanne Bras, MD [facility]  . IR GENERIC HISTORICAL  04/15/2016   IR VERTEBROPLASTY EA ADDL (T&LS) BX INC UNI/BIL INC INJECT/IMAGING 04/15/2016 Luanne Bras, MD [facility]  . IRRIGATION AND DEBRIDEMENT HEMATOMA Left 07/05/2015   Procedure: IRRIGATION AND DEBRIDEMENT HEMATOMA;  Surgeon: Robert Bellow, MD;  Location: [facility];  Service: General;  Laterality: Left;  . pyloric stenosis  07/1937  . US ECHOCARDIOGRAPHY  Allergies  Allergen Reactions  . Ace Inhibitors     Other reaction(s): Cough  . Hydrocodone Itching and Nausea Only  . Mercury     Other reaction(s): Unknown  . Nickel   . Silver Dermatitis    Severe itching  . Cephalexin Rash  . Clindamycin/Lincomycin Rash  . Penicillins Rash    Has patient had a PCN reaction causing immediate rash, facial/tongue/throat swelling, SOB or  lightheadedness with hypotension: YES Has patient had a PCN reaction causing severe rash involving mucus membranes or skin necrosis: NO Has patient had a PCN reaction that required hospitalization NO Has patient had a PCN reaction occurring within the last 10 years: NO If all of the above answers are "NO", then may proceed with Cephalosporin use.      Medication List       Accurate as of 05/13/16 11:59 PM. Always use your most recent med list.          acetaminophen 500 MG tablet Commonly known as:  TYLENOL Take 500 mg by mouth every 6 (six) hours as needed for mild pain, moderate pain, fever or headache.   aspirin EC 81 MG tablet Take 81 mg by mouth daily.   feeding supplement (ENSURE ENLIVE) Liqd Take 237 mLs by mouth 2 (two) times daily between meals.   furosemide 40 MG tablet Commonly known as:  LASIX Take 40 mg by mouth 2 (two) times daily.   liver oil-zinc oxide 40 % ointment Commonly known as:  DESITIN Apply 1 application topically 3 (three) times daily. Apply to bed sore   metoprolol succinate 25 MG 24 hr tablet Commonly known as:  TOPROL-XL Take 1 tablet (25 mg total) by mouth daily.   oxyCODONE 5 MG immediate release tablet Commonly known as:  Oxy IR/ROXICODONE Take 1 tablet (5 mg total) by mouth every 4 (four) hours.   pantoprazole 40 MG tablet Commonly known as:  PROTONIX Take 40 mg by mouth daily.   polyethylene glycol packet Commonly known as:  MIRALAX / GLYCOLAX Take 17 g by mouth 2 (two) times daily.   senna-docusate 8.6-50 MG tablet Commonly known as:  Senokot-S Take 1 tablet by mouth 2 (two) times daily.   spironolactone 25 MG tablet Commonly known as:  ALDACTONE Take 25 mg by mouth 2 (two) times daily.   warfarin 5 MG tablet Commonly known as:  COUMADIN Take 1 tablet (5 mg total) by mouth daily.       Review of Systems  Constitutional: Negative for activity change, appetite change, chills, diaphoresis and fever.  Respiratory:  Negative for apnea, cough, choking, chest tightness, shortness of breath and wheezing.   Cardiovascular: Negative for chest pain, palpitations and leg swelling.  Gastrointestinal: Positive for constipation, diarrhea and nausea. Negative for abdominal distention, abdominal pain, anal bleeding and blood in stool.  Genitourinary: Negative for difficulty urinating, dysuria, frequency, hematuria, urgency and vaginal bleeding.  Musculoskeletal: Positive for arthralgias (typical arthritis), back pain and myalgias. Negative for gait problem.  Skin: Negative for color change, pallor, rash and wound.  Neurological: Negative for dizziness, tremors, syncope, speech difficulty, weakness, numbness and headaches.  All other systems reviewed and are negative.    There is no immunization history on file for this patient. Pertinent  Health Maintenance Due  Topic Date Due  . DEXA SCAN  06/28/2002  . PNA vac Low Risk Adult (1 of 2 - PCV13) 06/28/2002  . INFLUENZA VACCINE  01/30/2016   Fall Risk  09/25/2015  Falls in the  past year? No   Functional Status Survey:    Vitals:   05/13/16 0500 05/17/16 1200  BP: 139/75   Pulse: 77   Resp: 20 20  Temp: 97.3 F (36.3 C) 97.9 F (36.6 C)  SpO2: 97% 91%  Weight: 136 lb 11.2 oz (62 kg)    Body mass index is 22.06 kg/m. Physical Exam  Constitutional: She is oriented to person, place, and time. Vital signs are normal. She appears well-developed and well-nourished. She is active and cooperative. She does not appear ill. No distress.  HENT:  Head: Normocephalic and atraumatic.  Mouth/Throat: Uvula is midline, oropharynx is clear and moist and mucous membranes are normal. Mucous membranes are not pale, not dry and not cyanotic.  Eyes: Conjunctivae, EOM and lids are normal. Pupils are equal, round, and reactive to light.  Neck: Trachea normal, normal range of motion and full passive range of motion without pain. Neck supple. No JVD present. No tracheal  deviation, no edema and no erythema present. No thyromegaly present.  Cardiovascular: Normal rate, regular rhythm, normal heart sounds, intact distal pulses and normal pulses.  Exam reveals no gallop, no distant heart sounds and no friction rub.   No murmur heard. Pulmonary/Chest: Effort normal and breath sounds normal. No accessory muscle usage. No respiratory distress. She has no wheezes. She has no rales. She exhibits no tenderness.  Abdominal: Normal appearance and bowel sounds are normal. She exhibits no distension and no ascites. There is no tenderness.  Musculoskeletal: Normal range of motion. She exhibits no edema.       Thoracic back: She exhibits tenderness.  Expected osteoarthritis, stiffness  Neurological: She is alert and oriented to person, place, and time. She has normal strength. No cranial nerve deficit or sensory deficit.  Skin: Skin is warm, dry and intact. No rash noted. She is not diaphoretic. No cyanosis or erythema. No pallor. Nails show no clubbing.  Psychiatric: She has a normal mood and affect. Her speech is normal and behavior is normal. Judgment and thought content normal. Cognition and memory are normal.  Nursing note and vitals reviewed.   Labs reviewed:  Recent Labs  05/14/16 0716 05/16/16 0610 05/17/16 0650  NA 131* 130* 130*  K 3.1* 2.9* 4.2  CL 91* 88* 87*  CO2 26 29 33*  GLUCOSE 103* 105* 100*  BUN 65* 74* 75*  CREATININE 2.89* 2.41* 2.41*  CALCIUM 9.5 9.3 9.6    Recent Labs  05/14/16 0716 05/16/16 0610 05/17/16 0650  AST 22 22 24   ALT 16 16 18   ALKPHOS 153* 149* 150*  BILITOT 0.9 0.7 1.1  PROT 7.0 6.8 7.3  ALBUMIN 3.9 3.9 4.2    Recent Labs  05/09/16 1135 05/14/16 0716 05/16/16 0610 05/17/16 0650  WBC 6.8 7.6 5.7 4.8  NEUTROABS 5.6 5.7 4.1  --   HGB 15.0 15.3 14.5 14.8  HCT 46.2 45.6 43.5 43.9  MCV 82.3 82.7 82.1 82.3  PLT 250 216 193 193   Lab Results  Component Value Date   TSH 2.239 04/13/2016   No results found for:  HGBA1C No results found for: CHOL, HDL, LDLCALC, LDLDIRECT, TRIG, CHOLHDL  Significant Diagnostic Results in last 30 days:  No results found.  Assessment/Plan 1. Orthostatic hypotension  Encourage po fluid intake  Check labs to evaluate hydration  Slow position changes    Family/ staff Communication:   Total Time:  Documentation:  Face to Face:  Family/Phone:   Labs/tests ordered:  Cbc, met c, pt/inr  Medication list reviewed and assessed for continued appropriateness.  Vikki Ports, NP-C [facility]iatric[facility] [phone] [place],  [address] Cell Phone (Mon-Fri 8am-5pm):  [phone] On Call:  [phone] & follow prompts after 5pm & weekends Office Phone:  [phone] Office Fax:  [phone]

## 2016-05-20 ENCOUNTER — Ambulatory Visit (HOSPITAL_COMMUNITY)
Admission: RE | Admit: 2016-05-20 | Discharge: 2016-05-20 | Disposition: A | Discharge status: Home / Self Care | Payer: Medicare Other | Source: Ambulatory Visit | Attending: Interventional Radiology | Admitting: Interventional Radiology

## 2016-05-20 DIAGNOSIS — IMO0001 Reserved for inherently not codable concepts without codable children: Secondary | ICD-10-CM

## 2016-05-20 DIAGNOSIS — M4850XS Collapsed vertebra, not elsewhere classified, site unspecified, sequela of fracture: Principal | ICD-10-CM

## 2016-05-20 HISTORY — PX: IR GENERIC HISTORICAL: IMG1180011

## 2016-05-20 LAB — COMPREHENSIVE METABOLIC PANEL
ALT: 18 U/L (ref 14–54)
AST: 24 U/L (ref 15–41)
Albumin: 3.9 g/dL (ref 3.5–5.0)
Alkaline Phosphatase: 122 U/L (ref 38–126)
Anion gap: 9 (ref 5–15)
BUN: 59 mg/dL — ABNORMAL HIGH (ref 6–20)
CO2: 32 mmol/L (ref 22–32)
Calcium: 9.6 mg/dL (ref 8.9–10.3)
Chloride: 92 mmol/L — ABNORMAL LOW (ref 101–111)
Creatinine, Ser: 1.86 mg/dL — ABNORMAL HIGH (ref 0.44–1.00)
GFR calc Af Amer: 29 mL/min — ABNORMAL LOW (ref >60)
GFR calc non Af Amer: 25 mL/min — ABNORMAL LOW (ref >60)
Glucose, Bld: 107 mg/dL — ABNORMAL HIGH (ref 65–99)
Potassium: 4.1 mmol/L (ref 3.5–5.1)
Sodium: 133 mmol/L — ABNORMAL LOW (ref 135–145)
Total Bilirubin: 0.9 mg/dL (ref 0.3–1.2)
Total Protein: 7 g/dL (ref 6.5–8.1)

## 2016-05-20 LAB — PROTIME-INR
INR: 1.23
PROTHROMBIN TIME: 15.6 s — AB (ref 11.4–15.2)

## 2016-05-21 ENCOUNTER — Encounter (HOSPITAL_COMMUNITY): Payer: Self-pay | Admitting: Interventional Radiology

## 2016-05-21 ENCOUNTER — Inpatient Hospital Stay: Admission: RE | Admit: 2016-05-21 | Payer: Self-pay | Source: Ambulatory Visit

## 2016-05-21 ENCOUNTER — Encounter: Payer: No Typology Code available for payment source | Admitting: Nurse Practitioner

## 2016-05-21 ENCOUNTER — Non-Acute Institutional Stay (SKILLED_NURSING_FACILITY): Payer: Medicare Other | Admitting: Gerontology

## 2016-05-21 DIAGNOSIS — N183 Chronic kidney disease, stage 3 unspecified: Secondary | ICD-10-CM

## 2016-05-21 DIAGNOSIS — Z5181 Encounter for therapeutic drug level monitoring: Secondary | ICD-10-CM | POA: Diagnosis not present

## 2016-05-21 LAB — CBC WITH DIFFERENTIAL/PLATELET
Basophils Absolute: 0.1 10*3/uL (ref 0–0.1)
Basophils Relative: 1 %
EOS ABS: 0.2 10*3/uL (ref 0–0.7)
EOS PCT: 4 %
HCT: 44 % (ref 35.0–47.0)
Hemoglobin: 14.6 g/dL (ref 12.0–16.0)
LYMPHS ABS: 0.8 10*3/uL — AB (ref 1.0–3.6)
Lymphocytes Relative: 16 %
MCH: 28 pg (ref 26.0–34.0)
MCHC: 33.1 g/dL (ref 32.0–36.0)
MCV: 84.6 fL (ref 80.0–100.0)
Monocytes Absolute: 0.6 10*3/uL (ref 0.2–0.9)
Monocytes Relative: 12 %
Neutro Abs: 3.5 10*3/uL (ref 1.4–6.5)
Neutrophils Relative %: 67 %
PLATELETS: 203 10*3/uL (ref 150–440)
RBC: 5.19 MIL/uL (ref 3.80–5.20)
RDW: 19.1 % — ABNORMAL HIGH (ref 11.5–14.5)
WBC: 5.2 10*3/uL (ref 3.6–11.0)

## 2016-05-21 LAB — COMPREHENSIVE METABOLIC PANEL
ALT: 19 U/L (ref 14–54)
ANION GAP: 12 (ref 5–15)
AST: 25 U/L (ref 15–41)
Albumin: 4.3 g/dL (ref 3.5–5.0)
Alkaline Phosphatase: 151 U/L — ABNORMAL HIGH (ref 38–126)
BUN: 44 mg/dL — ABNORMAL HIGH (ref 6–20)
CHLORIDE: 92 mmol/L — AB (ref 101–111)
CO2: 29 mmol/L (ref 22–32)
Calcium: 10.2 mg/dL (ref 8.9–10.3)
Creatinine, Ser: 1.7 mg/dL — ABNORMAL HIGH (ref 0.44–1.00)
GFR calc non Af Amer: 28 mL/min — ABNORMAL LOW (ref >60)
GFR, EST AFRICAN AMERICAN: 32 mL/min — AB (ref >60)
Glucose, Bld: 79 mg/dL (ref 65–99)
POTASSIUM: 4.5 mmol/L (ref 3.5–5.1)
SODIUM: 133 mmol/L — AB (ref 135–145)
Total Bilirubin: 0.6 mg/dL (ref 0.3–1.2)
Total Protein: 7.5 g/dL (ref 6.5–8.1)

## 2016-05-21 LAB — PROTIME-INR
INR: 1.38
Prothrombin Time: 17.1 seconds — ABNORMAL HIGH (ref 11.4–15.2)

## 2016-05-22 ENCOUNTER — Other Ambulatory Visit
Admission: RE | Admit: 2016-05-22 | Discharge: 2016-05-22 | Disposition: A | Discharge status: Home / Self Care | Payer: Medicare Other | Source: Ambulatory Visit | Attending: Gerontology | Admitting: Gerontology

## 2016-05-22 DIAGNOSIS — N189 Chronic kidney disease, unspecified: Secondary | ICD-10-CM | POA: Diagnosis present

## 2016-05-22 LAB — CBC WITH DIFFERENTIAL/PLATELET
BASOS ABS: 0.1 10*3/uL (ref 0–0.1)
Basophils Relative: 1 %
Eosinophils Absolute: 0.2 10*3/uL (ref 0–0.7)
Eosinophils Relative: 5 %
HEMATOCRIT: 38.7 % (ref 35.0–47.0)
Hemoglobin: 12.7 g/dL (ref 12.0–16.0)
LYMPHS PCT: 17 %
Lymphs Abs: 0.7 10*3/uL — ABNORMAL LOW (ref 1.0–3.6)
MCH: 27.6 pg (ref 26.0–34.0)
MCHC: 32.9 g/dL (ref 32.0–36.0)
MCV: 83.9 fL (ref 80.0–100.0)
Monocytes Absolute: 0.5 10*3/uL (ref 0.2–0.9)
Monocytes Relative: 12 %
NEUTROS ABS: 2.8 10*3/uL (ref 1.4–6.5)
Neutrophils Relative %: 65 %
PLATELETS: 163 10*3/uL (ref 150–440)
RBC: 4.62 MIL/uL (ref 3.80–5.20)
RDW: 19.3 % — ABNORMAL HIGH (ref 11.5–14.5)
WBC: 4.3 10*3/uL (ref 3.6–11.0)

## 2016-05-22 LAB — BASIC METABOLIC PANEL
ANION GAP: 8 (ref 5–15)
BUN: 42 mg/dL — ABNORMAL HIGH (ref 6–20)
CO2: 31 mmol/L (ref 22–32)
Calcium: 9.5 mg/dL (ref 8.9–10.3)
Chloride: 97 mmol/L — ABNORMAL LOW (ref 101–111)
Creatinine, Ser: 1.56 mg/dL — ABNORMAL HIGH (ref 0.44–1.00)
GFR calc Af Amer: 36 mL/min — ABNORMAL LOW (ref >60)
GFR, EST NON AFRICAN AMERICAN: 31 mL/min — AB (ref >60)
GLUCOSE: 93 mg/dL (ref 65–99)
POTASSIUM: 3.9 mmol/L (ref 3.5–5.1)
Sodium: 136 mmol/L (ref 135–145)

## 2016-05-22 LAB — PROTIME-INR
INR: 1.51
Prothrombin Time: 18.4 seconds — ABNORMAL HIGH (ref 11.4–15.2)

## 2016-05-22 NOTE — Progress Notes (Signed)
Carrie, [name] ([id number]) Visit Report for 05/21/2016 Arrival Information Details Patient Name: [name], [name]. Date of Service: 05/21/2016 1:30 PM Medical Record Number: [id number] Patient Account Number: [id number] Date of Birth/Sex: Aug 02, 1934 (80 y.o. Female) Treating RN: [name] Primary Care Physician: [name] Other Clinician: Referring Physician: [name] Treating Physician/Extender: Cathie Olden in Treatment: 25 Visit Information History Since Last Visit All ordered tests and consults were completed: No Patient Arrived: Wheel Chair Added or deleted any medications: No Arrival Time: 13:25 Any new allergies or adverse reactions: No Accompanied By: self Had a fall or experienced change in No Transfer Assistance: EasyPivot Patient activities of daily living that may affect Lift risk of falls: Patient Identification Verified: Yes Signs or symptoms of abuse/neglect since last No Secondary Verification Process Yes visito Completed: Hospitalized since last visit: No Patient Requires Transmission- No Pain Present Now: No Based Precautions: Patient Has Alerts: Yes Patient Alerts: Patient on Blood Thinner Winfred Signature(s) Signed: 05/21/2016 4:33:45 PM By: [name] Entered By: [name] on 05/21/2016 13:28:14 Wedin, [name] ([id number]) -------------------------------------------------------------------------------- Encounter Discharge Information Details Patient Name: Carrie Mulligan C. Date of Service: 05/21/2016 1:30 PM Medical Record Number: [id number] Patient Account Number: [id number] Date of Birth/Sex: 1935-03-20 (80 y.o. Female) Treating RN: [name] Primary Care Physician: [name] Other Clinician: Referring Physician: [name] Treating Physician/Extender: Cathie Olden in Treatment: 25 Encounter Discharge Information Items Discharge Pain  Level: 0 Discharge Condition: Stable Ambulatory Status: Wheelchair Discharge Destination: Nursing Home Transportation: Other Accompanied By: self Schedule Follow-up Appointment: Yes Medication Reconciliation completed and provided to Patient/Care Yes Toris Laverdiere: Provided on Clinical Summary of Care: 05/21/2016 Form Type Recipient Paper Patient MS Electronic Signature(s) Signed: 05/21/2016 4:33:45 PM By: [name] Previous Signature: 05/21/2016 2:32:00 PM Version By: Ruthine Dose Entered By: [name] on 05/21/2016 15:05:49 Brousseau, Francely Loletha Grayer ([id number]) -------------------------------------------------------------------------------- Lower Extremity Assessment Details Patient Name: [name], Briggette C. Date of Service: 05/21/2016 1:30 PM Medical Record Number: [id number] Patient Account Number: [id number] Date of Birth/Sex: 01/06/1935 (80 y.o. Female) Treating RN: Carolyne Fiscal, Debi Primary Care Physician: [name] Other Clinician: Referring Physician: [name] Treating Physician/Extender: Cathie Olden in Treatment: 25 Edema Assessment Assessed: [Left: No] [Right: No] E[Left: dema] [Right: :] Calf Left: Right: Point of Measurement: 34 cm From Medial Instep 32 cm 30 cm Ankle Left: Right: Point of Measurement: 10 cm From Medial Instep 21 cm 20.6 cm Vascular Assessment Pulses: Posterior Tibial Dorsalis Pedis Palpable: [Left:Yes] [Right:Yes] Extremity colors, hair growth, and conditions: Extremity Color: [Left:Mottled] [Right:Mottled] Temperature of Extremity: [Left:Warm] [Right:Warm] Capillary Refill: [Left:< 3 seconds] [Right:< 3 seconds] Toe Nail Assessment Left: Right: Thick: Yes Yes Discolored: Yes Yes Deformed: No No Improper Length and Hygiene: No No Notes heel to knee- 52cm Electronic Signature(s) Signed: 05/21/2016 4:33:45 PM By: [name] Entered By: [name] on 05/21/2016 14:26:22 Humber, [name]  ([id number]) [place], [place]. ([id number]) -------------------------------------------------------------------------------- Multi Wound Chart Details Patient Name: [name], [name] Date of Service: 05/21/2016 1:30 PM Medical Record Number: [id number] Patient Account Number: [id number] Date of Birth/Sex: 1935-05-23 (80 y.o. Female) Treating RN: [name] Primary Care Physician: [name] Other Clinician: Referring Physician: [name] Treating Physician/Extender: Cathie Olden in Treatment: 25 Vital Signs Height(in): 68 Pulse(bpm): 75 Weight(lbs): 178 Blood Pressure 141/78 (mmHg): Body Mass Index(BMI): 27 Temperature(F): 98.2 Respiratory Rate 16 (breaths/min): Photos: [6:No Photos] [8:No Photos] [N/A:N/A] Wound Location: [6:Left Knee - Lateral] [8:Right Lower Leg - Medial] [N/A:N/A] Wounding Event: [6:Trauma] [8:Trauma] [N/A:N/A] Primary Etiology: [6:Trauma, Other] [  8:Trauma, Other] [N/A:N/A] Comorbid History: [6:Cataracts, Arrhythmia, Hypotension, Osteoarthritis] [8:Cataracts, Arrhythmia, Hypotension, Osteoarthritis] [N/A:N/A] Date Acquired: [6:04/06/2016] [8:04/06/2016] [N/A:N/A] Weeks of Treatment: [6:3] [8:3] [N/A:N/A] Wound Status: [6:Open] [8:Open] [N/A:N/A] Measurements L x W x D 0x0x0 [8:0.6x0.6x0.3] [N/A:N/A] (cm) Area (cm) : [6:0] [8:0.283] [N/A:N/A] Volume (cm) : [6:0] [8:0.085] [N/A:N/A] % Reduction in Area: [6:100.00%] [8:-28.60%] [N/A:N/A] % Reduction in Volume: 100.00% [8:-286.40%] [N/A:N/A] Classification: [6:Partial Thickness] [8:Partial Thickness] [N/A:N/A] Exudate Amount: [6:None Present] [8:Large] [N/A:N/A] Exudate Type: [6:N/A] [8:Serosanguineous] [N/A:N/A] Exudate Color: [6:N/A] [8:red, brown] [N/A:N/A] Wound Margin: [6:Distinct, outline attached] [8:Distinct, outline attached] [N/A:N/A] Granulation Amount: [6:None Present (0%)] [8:None Present (0%)] [N/A:N/A] Necrotic Amount: [6:None Present (0%)] [8:Large (67-100%)]  [N/A:N/A] Necrotic Tissue: [6:N/A] [8:Eschar] [N/A:N/A] Exposed Structures: [6:Fascia: No Fat: No Tendon: No Muscle: No Joint: No Bone: No] [8:Fascia: No Fat: No Tendon: No Muscle: No Joint: No Bone: No] [N/A:N/A] Limited to Skin Limited to Skin Breakdown Breakdown Epithelialization: Large (67-100%) None N/A Debridement: N/A Debridement ([id number]) Pre-procedure N/A 13:59 N/A Verification/Time Out Taken: Pain Control: N/A Lidocaine 4% Topical N/A Solution Tissue Debrided: N/A Fibrin/Slough, Exudates, N/A Subcutaneous Level: N/A Skin/Subcutaneous N/A Tissue Debridement Area (sq N/A 0.36 N/A cm): Instrument: N/A Blade N/A Bleeding: N/A Minimum N/A Hemostasis Achieved: N/A Pressure N/A Procedural Pain: N/A 0 N/A Post Procedural Pain: N/A 0 N/A Debridement Treatment N/A Procedure was tolerated N/A Response: well Post Debridement N/A 0.6x0.6x0.4 N/A Measurements L x W x D (cm) Post Debridement N/A 0.113 N/A Volume: (cm) Periwound Skin Texture: Edema: No Edema: Yes N/A Periwound Skin Moist: No Moist: Yes N/A Moisture: Periwound Skin Color: Erythema: Yes No Abnormalities Noted N/A Erythema Location: Circumferential N/A N/A Temperature: No Abnormality No Abnormality N/A Tenderness on Yes Yes N/A Palpation: Wound Preparation: Ulcer Cleansing: Other: Ulcer Cleansing: Other: N/A soap and water soap and water Topical Anesthetic Applied: Other: lidocaine 4% Procedures Performed: N/A Debridement N/A Treatment Notes Wound #8 (Right, Medial Lower Leg) 1. Cleansed with: Clean wound with Normal Saline Cleanse wound with antibacterial soap and water Furgason, Samanthamarie C. ([id number]) 2. Anesthetic Topical Lidocaine 4% cream to wound bed prior to debridement 3. Peri-wound Care: Moisturizing lotion 4. Dressing Applied: Iodosorb Ointment 5. Secondary Dressing Applied ABD Pad Dry Gauze 7. Secured with Tape 3 Layer Compression System - Right Lower  Extremity Notes LLE- Cleanse with soap and water, dry and moisturize, wrap with kerlix and ace wrap per NP Electronic Signature(s) Signed: 05/21/2016 4:33:45 PM By: [name] Entered By: [name] on 05/21/2016 15:05:35 Erbes, [name] ([id number]) -------------------------------------------------------------------------------- [place] Details Patient Name: [name] Date of Service: 05/21/2016 1:30 PM Medical Record Number: [id number] Patient Account Number: [id number] Date of Birth/Sex: Apr 09, 1935 (80 y.o. Female) Treating RN: Carolyne Fiscal, Debi Primary Care Physician: [name] Other Clinician: Referring Physician: [name] Treating Physician/Extender: Cathie Olden in Treatment: 25 Active Inactive Orientation to the Wound Care Program Nursing Diagnoses: Knowledge deficit related to the wound healing center program Goals: Patient/caregiver will verbalize understanding of the [facility] Program Date Initiated: 11/22/2015 Goal Status: Active Interventions: Provide education on orientation to the wound center Notes: Venous Leg Ulcer Nursing Diagnoses: Knowledge deficit related to disease process and management Potential for venous Insuffiency (use before diagnosis confirmed) Goals: Patient will maintain optimal edema control Date Initiated: 11/22/2015 Goal Status: Active Patient/caregiver will verbalize understanding of disease process and disease management Date Initiated: 11/22/2015 Goal Status: Active Verify adequate tissue perfusion prior to therapeutic compression application Date Initiated: 11/22/2015 Goal Status: Active Interventions: Assess peripheral edema status every  visit. Compression as ordered Provide education on venous insufficiency PIERRA, SKORA ([id number]) Treatment Activities: Non-invasive vascular studies : 11/22/2015 Therapeutic compression applied :  11/22/2015 Notes: Wound/Skin Impairment Nursing Diagnoses: Impaired tissue integrity Knowledge deficit related to ulceration/compromised skin integrity Goals: Patient/caregiver will verbalize understanding of skin care regimen Date Initiated: 11/22/2015 Goal Status: Active Ulcer/skin breakdown will have a volume reduction of 30% by week 4 Date Initiated: 11/22/2015 Goal Status: Active Ulcer/skin breakdown will have a volume reduction of 50% by week 8 Date Initiated: 11/22/2015 Goal Status: Active Ulcer/skin breakdown will have a volume reduction of 80% by week 12 Date Initiated: 11/22/2015 Goal Status: Active Ulcer/skin breakdown will heal within 14 weeks Date Initiated: 11/22/2015 Goal Status: Active Interventions: Assess patient/caregiver ability to perform ulcer/skin care regimen upon admission and as needed Assess ulceration(s) every visit Provide education on ulcer and skin care Treatment Activities: Skin care regimen initiated : 11/22/2015 Topical wound management initiated : 11/22/2015 Notes: Electronic Signature(s) Signed: 05/21/2016 4:33:45 PM By: [name] Entered By: [name] on 05/21/2016 15:05:28 Anglada, [name] ([id number]) Mathena, Devory C. ([id number]) -------------------------------------------------------------------------------- Pain Assessment Details Patient Name: Carrie Mulligan C. Date of Service: 05/21/2016 1:30 PM Medical Record Number: [id number] Patient Account Number: [id number] Date of Birth/Sex: 07/19/1934 (80 y.o. Female) Treating RN: [name] Primary Care Physician: [name] Other Clinician: Referring Physician: [name] Treating Physician/Extender: Cathie Olden in Treatment: 25 Active Problems Location of Pain Severity and Description of Pain Patient Has Paino No Site Locations With Dressing Change: No Pain Management and Medication Current Pain Management: Electronic Signature(s) Signed:  05/21/2016 4:33:45 PM By: [name] Entered By: [name] on 05/21/2016 13:28:22 Giancola, [name] ([id number]) -------------------------------------------------------------------------------- Patient/Caregiver Education Details Patient Name: [name] Date of Service: 05/21/2016 1:30 PM Medical Record Number: [id number] Patient Account Number: [id number] Date of Birth/Gender: Apr 18, 1935 (80 y.o. Female) Treating RN: [name] Primary Care Physician: [name] Other Clinician: Referring Physician: [name] Treating Physician/Extender: Cathie Olden in Treatment: 25 Education Assessment Education Provided To: Patient Education Topics Provided Wound/Skin Impairment: Handouts: Other: do not get wrap wet Methods: Demonstration, Explain/Verbal Responses: State content correctly Electronic Signature(s) Signed: 05/21/2016 4:33:45 PM By: [name] Entered By: [name] on 05/21/2016 15:06:01 Tung, [place]. ([id number]) -------------------------------------------------------------------------------- Wound Assessment Details Patient Name: Carrie Mulligan C. Date of Service: 05/21/2016 1:30 PM Medical Record Number: [id number] Patient Account Number: [id number] Date of Birth/Sex: [birth year] (80 y.o. Female) Treating RN: Carolyne Fiscal, Debi Primary Care Physician: [name] Other Clinician: Referring Physician: [name] Treating Physician/Extender: Cathie Olden in Treatment: 25 Wound Status Wound Number: 6 Primary Trauma, Other Etiology: Wound Location: Left Knee - Lateral Wound Status: Open Wounding Event: Trauma Comorbid Cataracts, Arrhythmia, Hypotension, Date Acquired: 04/06/2016 History: Osteoarthritis Weeks Of Treatment: 3 Clustered Wound: No Photos Photo Uploaded By: [name] on 05/21/2016 15:27:49 Wound Measurements Length: (cm) Width: (cm) Depth: (cm) Area: (cm) Volume: (cm) 0  % Reduction in Area: 100% 0 % Reduction in Volume: 100% 0 Epithelialization: Large (67-100%) 0 Tunneling: No 0 Undermining: No Wound Description Classification: Partial Thickness Wound Margin: Distinct, outline attached Exudate Amount: None Present Foul Odor After Cleansing: No Wound Bed Granulation Amount: None Present (0%) Exposed Structure Necrotic Amount: None Present (0%) Fascia Exposed: No Fat Layer Exposed: No Tendon Exposed: No Muscle Exposed: No Joint Exposed: No Bone Exposed: No Limited to Skin Breakdown Blankenburg, Brody C. ([id number]) Periwound Skin Texture Texture Color No Abnormalities Noted: No No Abnormalities Noted: No Localized Edema: No Erythema: Yes Erythema Location:  Circumferential Moisture No Abnormalities Noted: No Temperature / Pain Moist: No Temperature: No Abnormality Tenderness on Palpation: Yes Wound Preparation Ulcer Cleansing: Other: soap and water, Electronic Signature(s) Signed: 05/21/2016 4:33:45 PM By: [name] Entered By: [name] on 05/21/2016 14:06:10 Notaro, Breelyn C. ([id number]) -------------------------------------------------------------------------------- Wound Assessment Details Patient Name: [name], Mckensi C. Date of Service: 05/21/2016 1:30 PM Medical Record Number: [id number] Patient Account Number: [id number] Date of Birth/Sex: 1935-05-03 (80 y.o. Female) Treating RN: Carolyne Fiscal, Debi Primary Care Physician: [name] Other Clinician: Referring Physician: [name] Treating Physician/Extender: Cathie Olden in Treatment: 25 Wound Status Wound Number: 8 Primary Trauma, Other Etiology: Wound Location: Right Lower Leg - Medial Wound Status: Open Wounding Event: Trauma Comorbid Cataracts, Arrhythmia, Hypotension, Date Acquired: 04/06/2016 History: Osteoarthritis Weeks Of Treatment: 3 Clustered Wound: No Photos Photo Uploaded By: [name] on 05/21/2016 15:27:49 Wound  Measurements Length: (cm) 0.6 Width: (cm) 0.6 Depth: (cm) 0.3 Area: (cm) 0.283 Volume: (cm) 0.085 % Reduction in Area: -28.6% % Reduction in Volume: -286.4% Epithelialization: None Tunneling: No Undermining: No Wound Description Classification: Partial Thickness Wound Margin: Distinct, outline attached Exudate Amount: Large Exudate Type: Serosanguineous Exudate Color: red, brown Foul Odor After Cleansing: No Wound Bed Granulation Amount: None Present (0%) Exposed Structure Necrotic Amount: Large (67-100%) Fascia Exposed: No Necrotic Quality: Eschar Fat Layer Exposed: No Tendon Exposed: No Muscle Exposed: No Joint Exposed: No Bone Exposed: No Koltz, Azilee C. ([id number]) Limited to Skin Breakdown Periwound Skin Texture Texture Color No Abnormalities Noted: No No Abnormalities Noted: No Localized Edema: Yes Temperature / Pain Moisture Temperature: No Abnormality No Abnormalities Noted: No Tenderness on Palpation: Yes Moist: Yes Wound Preparation Ulcer Cleansing: Other: soap and water, Topical Anesthetic Applied: Other: lidocaine 4%, Treatment Notes Wound #8 (Right, Medial Lower Leg) 1. Cleansed with: Clean wound with Normal Saline Cleanse wound with antibacterial soap and water 2. Anesthetic Topical Lidocaine 4% cream to wound bed prior to debridement 3. Peri-wound Care: Moisturizing lotion 4. Dressing Applied: Iodosorb Ointment 5. Secondary Dressing Applied ABD Pad Dry Gauze 7. Secured with Tape 3 Layer Compression System - Right Lower Extremity Notes LLE- Cleanse with soap and water, dry and moisturize, wrap with kerlix and ace wrap per NP Electronic Signature(s) Signed: 05/21/2016 4:33:45 PM By: [name] Entered By: [name] on 05/21/2016 13:45:17 Simons, Naesha Loletha Grayer ([id number]) -------------------------------------------------------------------------------- Vitals Details Patient Name: Carrie Mulligan C. Date of  Service: 05/21/2016 1:30 PM Medical Record Number: [id number] Patient Account Number: [id number] Date of Birth/Sex: May 10, 1935 (80 y.o. Female) Treating RN: Carolyne Fiscal, Debi Primary Care Physician: [name] Other Clinician: Referring Physician: [name] Treating Physician/Extender: Cathie Olden in Treatment: 25 Vital Signs Time Taken: 13:28 Temperature (F): 98.2 Height (in): 68 Pulse (bpm): 75 Weight (lbs): 178 Respiratory Rate (breaths/min): 16 Body Mass Index (BMI): 27.1 Blood Pressure (mmHg): 141/78 Reference Range: 80 - 120 mg / dl Electronic Signature(s) Signed: 05/21/2016 4:33:45 PM By: [name] Entered By: [name] on 05/21/2016 13:28:39

## 2016-05-24 LAB — BASIC METABOLIC PANEL
Anion gap: 7 (ref 5–15)
BUN: 39 mg/dL — ABNORMAL HIGH (ref 6–20)
CHLORIDE: 100 mmol/L — AB (ref 101–111)
CO2: 29 mmol/L (ref 22–32)
Calcium: 9.6 mg/dL (ref 8.9–10.3)
Creatinine, Ser: 1.55 mg/dL — ABNORMAL HIGH (ref 0.44–1.00)
GFR, EST AFRICAN AMERICAN: 36 mL/min — AB (ref >60)
GFR, EST NON AFRICAN AMERICAN: 31 mL/min — AB (ref >60)
Glucose, Bld: 91 mg/dL (ref 65–99)
POTASSIUM: 4.5 mmol/L (ref 3.5–5.1)
SODIUM: 136 mmol/L (ref 135–145)

## 2016-05-24 LAB — CBC WITH DIFFERENTIAL/PLATELET
BASOS ABS: 0.1 10*3/uL (ref 0–0.1)
Basophils Relative: 1 %
EOS ABS: 0.2 10*3/uL (ref 0–0.7)
Eosinophils Relative: 6 %
HCT: 37.3 % (ref 35.0–47.0)
HEMOGLOBIN: 12.3 g/dL (ref 12.0–16.0)
Lymphocytes Relative: 17 %
Lymphs Abs: 0.7 10*3/uL — ABNORMAL LOW (ref 1.0–3.6)
MCH: 27.8 pg (ref 26.0–34.0)
MCHC: 32.9 g/dL (ref 32.0–36.0)
MCV: 84.5 fL (ref 80.0–100.0)
Monocytes Absolute: 0.5 10*3/uL (ref 0.2–0.9)
Monocytes Relative: 12 %
NEUTROS PCT: 64 %
Neutro Abs: 2.7 10*3/uL (ref 1.4–6.5)
Platelets: 159 10*3/uL (ref 150–440)
RBC: 4.41 MIL/uL (ref 3.80–5.20)
RDW: 19.2 % — ABNORMAL HIGH (ref 11.5–14.5)
WBC: 4.2 10*3/uL (ref 3.6–11.0)

## 2016-05-24 LAB — PROTIME-INR
INR: 1.58
PROTHROMBIN TIME: 19 s — AB (ref 11.4–15.2)

## 2016-05-26 LAB — CBC WITH DIFFERENTIAL/PLATELET
BASOS ABS: 0 10*3/uL (ref 0–0.1)
Basophils Relative: 1 %
EOS ABS: 0.2 10*3/uL (ref 0–0.7)
EOS PCT: 4 %
HCT: 37.9 % (ref 35.0–47.0)
Hemoglobin: 12.4 g/dL (ref 12.0–16.0)
Lymphocytes Relative: 18 %
Lymphs Abs: 0.7 10*3/uL — ABNORMAL LOW (ref 1.0–3.6)
MCH: 28 pg (ref 26.0–34.0)
MCHC: 32.6 g/dL (ref 32.0–36.0)
MCV: 85.7 fL (ref 80.0–100.0)
Monocytes Absolute: 0.5 10*3/uL (ref 0.2–0.9)
Monocytes Relative: 12 %
Neutro Abs: 2.6 10*3/uL (ref 1.4–6.5)
Neutrophils Relative %: 65 %
PLATELETS: 181 10*3/uL (ref 150–440)
RBC: 4.43 MIL/uL (ref 3.80–5.20)
RDW: 19.2 % — AB (ref 11.5–14.5)
WBC: 4 10*3/uL (ref 3.6–11.0)

## 2016-05-26 LAB — BASIC METABOLIC PANEL
Anion gap: 8 (ref 5–15)
BUN: 30 mg/dL — AB (ref 6–20)
CALCIUM: 9.5 mg/dL (ref 8.9–10.3)
CO2: 28 mmol/L (ref 22–32)
CREATININE: 1.4 mg/dL — AB (ref 0.44–1.00)
Chloride: 102 mmol/L (ref 101–111)
GFR calc non Af Amer: 35 mL/min — ABNORMAL LOW (ref >60)
GFR, EST AFRICAN AMERICAN: 41 mL/min — AB (ref >60)
Glucose, Bld: 83 mg/dL (ref 65–99)
Potassium: 4.8 mmol/L (ref 3.5–5.1)
SODIUM: 138 mmol/L (ref 135–145)

## 2016-05-26 LAB — PROTIME-INR
INR: 1.48
PROTHROMBIN TIME: 18.1 s — AB (ref 11.4–15.2)

## 2016-05-28 ENCOUNTER — Other Ambulatory Visit
Admission: RE | Admit: 2016-05-28 | Discharge: 2016-05-28 | Disposition: A | Discharge status: Home / Self Care | Payer: Medicare Other | Source: Ambulatory Visit | Attending: Internal Medicine | Admitting: Internal Medicine

## 2016-05-28 ENCOUNTER — Encounter: Payer: No Typology Code available for payment source | Admitting: Internal Medicine

## 2016-05-28 DIAGNOSIS — B999 Unspecified infectious disease: Secondary | ICD-10-CM | POA: Diagnosis present

## 2016-05-28 LAB — PROTIME-INR
INR: 1.36
PROTHROMBIN TIME: 16.9 s — AB (ref 11.4–15.2)

## 2016-05-28 NOTE — Progress Notes (Signed)
Carrie, [name] ([id number]) Visit Report for 05/21/2016 Chief Complaint Document Details Patient Name: [name], [name] 05/21/2016 1:30 Date of Service: PM Medical Record [id number] Number: Patient Account Number: [id number] 04-24-1935 (81 y.o. Treating RN: [name] Date of Birth/Sex: Female) Other Clinician: Primary Care Physician: [name] Treating Rene Kocher, Emalie Mcwethy Referring Physician: [name] Physician/Extender: Suella Grove in Treatment: 25 Information Obtained from: Patient Chief Complaint Patient here for reevaluation of her right lower externally ulcer Electronic Signature(s) Signed: 05/21/2016 3:23:04 PM By: Rene Kocher, NP, Fredy Gladu Entered By: Rene Kocher, NP, Najae Rathert on 05/21/2016 15:23:04 [name] ([id number]) -------------------------------------------------------------------------------- Debridement Details Patient Name: Carrie, [name] 05/21/2016 1:30 Date of Service: PM Medical Record [id number] Number: Patient Account Number: [id number] 04/30/1935 (81 y.o. Treating RN: [name] Date of Birth/Sex: Female) Other Clinician: Primary Care Physician: [name] Treating Lasaundra Riche, [place] Referring Physician: [name] Physician/Extender: Suella Grove in Treatment: 25 Debridement Performed for Wound #8 Right,Medial Lower Leg Assessment: Performed By: Physician Lawanda Cousins, NP Debridement: Open Wound/Selective Debridement Selective Description: Pre-procedure Yes - 13:59 Verification/Time Out Taken: Start Time: 14:00 Pain Control: Lidocaine 4% Topical Solution Total Area Debrided (L x 0.6 (cm) x 0.6 (cm) = 0.36 (cm) W): Tissue and other Viable, Non-Viable, Exudate, Fibrin/Slough, Subcutaneous material debrided: Instrument: Blade Bleeding: Minimum Hemostasis Achieved: Pressure End Time: 14:04 Procedural Pain: 0 Post Procedural Pain: 0 Response to Treatment: Procedure was tolerated well Post Debridement Measurements of Total  Wound Length: (cm) 0.6 Width: (cm) 0.6 Depth: (cm) 0.4 Volume: (cm) 0.113 Character of Wound/Ulcer Post Requires Further Debridement Debridement: Severity of Tissue Post Debridement: Fat layer exposed Post Procedure Diagnosis Same as Pre-procedure Electronic Signature(s) Signed: 05/21/2016 4:15:22 PM By: Rene Kocher, NP, Ulice Dash, [name] ([id number]) Signed: 05/21/2016 4:33:45 PM By: [name] Entered By: Rene Kocher, NP, Blaise Grieshaber on 05/21/2016 16:15:21 [place], [name] ([id number]) -------------------------------------------------------------------------------- HPI Details Patient Name: Carrie, [name] 05/21/2016 1:30 Date of Service: PM Medical Record [id number] Number: Patient Account Number: [id number] 1934/11/03 (81 y.o. Treating RN: [name] Date of Birth/Sex: Female) Other Clinician: Primary Care Physician: [name] Treating Ahnika Hannibal Referring Physician: [name] Physician/Extender: Suella Grove in Treatment: 25 History of Present Illness HPI Description: 11/22/15; this is [name] is a 81 year old woman who lives at home on her own. According to the patient and her daughter was present she has had long-standing edema in her legs dating back many years. She also has a history of chronic systolic heart failure, atrial fibrillation and is status post mitral valve replacement. Last echocardiogram I see in [facility] showed an ejection fraction of 40-45% she is on Lasix 60 mg a day and spironolactone 25 mg a day. Her current problem began in December around Christmas time she developed a small hematoma in the medial part of her left leg which rapidly expanded to a very large hematoma that required surgical debridement. This situation was complicated by the fact that the patient is on long-standing Coumadin for mechanical heart valve. She went to the OR had this evacuated on January 4 /17. The wound has gradually improved however she has  developed a small wounds around this area and more recently a wound on the right lateral leg. She is weeping edema fluid. The patient is already been to see vascular surgery. It was recommended that she wear Unna boots, she did not tolerate this due to pain in the left leg. She was then prescribed Juzo stockings and really can't get these on herself although truthfully there is probably too much edema for a Juzo stockings currently.  She is not a diabetic and has no history of PAD or claudication that I could elicit. She does not use the external compression pumps reliably. She comes today with notes from her primary physician and Dr. Ronalee Belts both recommending various forms of compression but the patient does not really complied with them. Has been using the external compression pumps with some regularity but certainly not daily on the right leg and this has helped. I also note that her daughter tells me the history that she does not sleep in bed at home. She has a hospital bed but with her legs up she finds this painful so she sleeps in the couch sitting up with her legs dependent. 11/29/15; the patient is arrives today accompanied by her son. He expresses satisfaction that she is maintained the compression all week. 12/06/15; the patient has kept her Profore light wraps on, we have good edema control no major change in the wounds we have been using Aquacel 12/13/15; changed to RTD last week. One of the 3 wounds on her left medial leg is healed she has 2 remaining wounds here and one on the right lateral leg. 12/18/2015 -- the patient was at Dr. Nino [name] office today and he was seeing her for an arterial study. While the wrap was being removed she had an inadvertent laceration of the left proximal anterior leg which bled quite profusely and a compression dressing was applied over this and the patient was here to get her Profore wraps done. I was asked to see the patient to make an assessment and  treat appropriately. 12/26/15; the patient's injury on the left proximal leg and Steri-Strips removed after soaking. There is an open area here. The original wounds to still open on the right lateral and left medial leg. 01/03/16 patient's injury on her proximal left leg looks quite good. Still small open area on the medial left leg which appears to be improving. The area on the right lateral leg still substantially open with no real improvement in wound depth. Her edema control is marginal with a Profore light. We have been using RTD for 3-4 weeks without any major change 01/10/16: wounds without s/s of infection. vascular results are pending regarding arterial studies. KAHO, SELLE ([id number]) 01/17/16; patient comes in today complaining of severe pain however I think most of this is in the right hip not related to her wounds. She continues with a oval-shaped wound on the right lateral leg, trauma to the left anterior leg just below her tibial plateau. She has a smaller eschar on the left anterior leg. She is being using Prisma however she informs [place] today that she is actually allergic to silver, nose this from a previous application at [facility] some years ago 01/24/16; edema is not so well controlled today, I think I reduced her to [place] bilaterally last week. The area which was a scissor injury on her left upper anterior lower leg is fully epithelialized. She only has a small open area remaining on the medial aspect of her left leg. The oval-shaped wound on the right mid lateral leg may be a bit smaller. Debrided of surface slough nonviable subcutaneous tissue. I had changed to collagen 2 weeks ago in an attempt to get this to close 01/31/16 all the patient's wounds on her left leg give healed. We have good edema control with bilateral Profore lites which she has been compliant with. She still has the oval-shaped wound on the right lateral calf allergic even this  appears to be slowly  improving. The patient has juxtalite stockings at home. She states she thinks she can put these on. She also has external compression pumps at home although I think her compliance with this has not been good in the past. She complains today of edema in her thighs. Tells me she takes 40 mg of Lasix daily 02/21/16; only 1 small wound remains on the lateral aspect of the right calf. She is using Juzo stockings on the left leg although she complains about difficulty in applying them. She has external compression pumps at home 02/28/16; the small open wound on her right lateral calf is improved in terms of wound area. It appears that she has a wrap injury on the anterior aspect of the upper leg 03/06/16; the small open wound on her right lateral calf has a very small open area remaining. The wrap injury on the anterior aspect of the upper leg also appears better. She arrives today in clinic with a history of dyspnea with minimal exertion starting with the last 2-4 days. She does not describe chest pain. Her son and our intake nurse think she has facial swelling. She has a history of an artificial mitral valve on Coumadin 03/13/16; the small wound on the right lateral calf is no better this week. The superior wrap injury anteriorly requires debridement of surface eschar and nonviable subcutaneous tissue. She has been to her primary doctor with regards to her dyspnea we identified last week. Per the patient's description her Lasix has been increased 03/21/2016 -- patient of Dr. Dellia Nims who could not keep her appointment yesterday but has come in today with the right lower leg looking good and this is the leg which has been treated in the recent past. However, her left lower extremity is extremely swollen, tender and edematous with redness and discoloration. 03/27/16; there is only 2 small areas remain on the right leg. The edema that was so concerning last week has come down however there is extensive  bruising on the lateral left leg medial left foot suggesting that she lost some blood into the leg itself. I checked her hemoglobin on 9/21 was 14.5. Her INR was over 6. Duplex ultrasound was negative for DVT 04/03/16 at this point in time today patient is actually doing substantially better in regard to the wound on the anterior right lower extremity. currently there is no slough or eschar noted and no evidence of erythema, discharge, or local infection. She is exhibiting no signs of systemic infection. She is tolerating the dressing changes as well as the wrapping at this point in time. 04/24/16; I have not seen Mrs. Kristensen for 3 weeks. Apparently she was admitted to hospital for respiratory issues/also apparently has had multiple compression fractures and had kyphoplasty. When she was last here she only had one small open area remaining on the left leg she was using juxtalite stockings. Her son is upset about restarting the Coumadin which she blames or the swelling in her legs. She is on Coumadin for prophylaxis with a chronic artificial heart valve. 04/30/16 at this point in time patient has been tolerating the 3 layer compression wrap as well as the calcium alginate. 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 With tthat being said she does have the [place], [place]. ([id number]) continued wound of the left lower extremity as well as the right medial lower extremity. The right side is significantly smaller compared to the left but actually is slough covered. fortunately she has  no significant tenderness at rest although with manipulation of the right location of the wound this is significantly tender. 05/07/16 for follow-up evaluation today both the patient's wounds appear to be significantly improved in size compared to last week. She is also having less pain at both locations currently. Her pain level at most is related to be a 1-2 out of 10. She does have some discharge but fortunately  no evidence of infection at this point.she also continues to tolerate the compression wraps well. 05/14/16; the patient's wound on the left leg actually looks as though it's on its way to closure. She is using collagen to this area. She has a small punched out area over the right medial calf. The cause of this is not really clear it has probably 0.4 cm of depth. She has an IV in her right hand which she states is for IV fluid when she develops low sodiums her potassiums, the etiology of this is not clear 05-21-16 she presents today with continued ulcerations the right lower extremity, posterior aspect. she has been using Iodoflex and compression therapy to the area. She denies any new injuries or trauma. She does have 2 areas proximal to this ulceration of dry crust along with an area to her left posterior lower leg that is purple discolored area appears similar to how her current ulceration started, she denies any trauma or injury to the left posterior leg we will monitor all of these areas. Electronic Signature(s) Signed: 05/21/2016 4:07:31 PM By: Rene Kocher, NP, Jana Hakim By: Rene Kocher, NP, Kaila Devries on 05/21/2016 16:07:30 [name], [name] ([id number]) -------------------------------------------------------------------------------- Physical Exam Details Patient Name: MAZIKEEN, HEHN 05/21/2016 1:30 Date of Service: PM Medical Record [id number] Number: Patient Account Number: [id number] 12/04/1934 (80 y.o. Treating RN: [name] Date of Birth/Sex: Female) Other Clinician: Primary Care Physician: [name] Treating Rene Kocher, Samuell Knoble Referring Physician: [name] Physician/Extender: Suella Grove in Treatment: 25 Constitutional BP within normal limits. afebrile. well nourished; well developed; appears stated age;Marland Kitchen Respiratory non-labored respiratory effort. Cardiovascular palpable DP, non-palpable PT. hemosiderin staining to right lower extremity, warm well perfused,  capillary refill less than 3 seconds. Musculoskeletal ambulates with cane. Integumentary (Hair, Skin) Richard tissue throughout ulceration on right lower extremity with central area of slough; proximal to this ulceration there are 2 areas of crust which patient denies any trauma or injury; the posterior left lateral leg there is an area of purple discoloration of the per patient denies any trauma or injury but admits that it is similar in appearance to the origin of the right lower extremity ulcer; Ulcer to left lateral knee is completely epithelialized. no induration, no fluctuance. Psychiatric appears to make sound judgement and have accurate insight regarding healthcare. oriented to time, place, person and situation. anxious with any intervention. Notes patient states that juxta light compression to left lower extremity are ill fitting and loose, she states that the measurements that were obtained when she had much more edema. We will wrap her left lower extremity and an Ace wrap and order those 3 times a week through the rehabilitation facility she is currently at. We have given her leg measurements and encouraged her to have her daughter contact the company that she ordered the juxta light compression through for potential reimbursement and/or a more appropriate sized garment. Electronic Signature(s) Signed: 05/21/2016 4:13:54 PM By: Rene Kocher, NP, Melva Faux Previous Signature: 05/21/2016 4:11:27 PM Version By: Rene Kocher, NP, Jana Hakim By: Rene Kocher, NP, Layci Stenglein on 05/21/2016 16:13:53 Byrnes, [name] ([id number]) -------------------------------------------------------------------------------- Physician Orders Details Patient  Name: EULAMAE, GREENSTEIN 05/21/2016 1:30 Date of Service: PM Medical Record [id number] Number: Patient Account Number: [id number] Nov 07, 1934 (81 y.o. Treating RN: [name] Date of Birth/Sex: Female) Other Clinician: Primary Care Physician: [name]  Treating Rene Kocher, Herberth Deharo Referring Physician: [name] Physician/Extender: Suella Grove in Treatment: 25 Verbal / Phone Orders: Yes Clinician: Carolyne Fiscal, Debi Read Back and Verified: Yes Diagnosis Coding Wound Cleansing Wound #8 Right,Medial Lower Leg o Clean wound with Normal Saline. o Cleanse wound with mild soap and water o No tub bath. - give sink or bed baths Anesthetic Wound #8 Right,Medial Lower Leg o Topical Lidocaine 4% cream applied to wound bed prior to debridement - for clinic use Primary Wound Dressing Wound #8 Right,Medial Lower Leg o Iodosorb Ointment Secondary Dressing Wound #8 Right,Medial Lower Leg o ABD pad o Dry Gauze Dressing Change Frequency Wound #8 Right,Medial Lower Leg o Change dressing every week Follow-up Appointments Wound #8 Right,Medial Lower Leg o Return Appointment in 1 week. Edema Control Wound #8 Right,Medial Lower Leg o 3 Layer Compression System - Bilateral Additional Orders / Instructions Wound #8 Right,Medial Lower Leg [name], [name] ([id number]) o Increase protein intake. Notes LLE- Cleanse with soap and water, dry and moisturize, wrap with kerlix and ace wrap, change on Tuesdays and Fridays. Electronic Signature(s) Signed: 05/21/2016 4:33:45 PM By: [name] Signed: 05/28/2016 9:25:32 AM By: Rene Kocher, NP, Keslie Gritz Entered By: [name] on 05/21/2016 14:23:53 Rozo, [name] ([id number]) -------------------------------------------------------------------------------- Problem List Details Patient Name: ARYONNA, GUNNERSON 05/21/2016 1:30 Date of Service: PM Medical Record [id number] Number: Patient Account Number: [id number] Mar 07, 1935 (81 y.o. Treating RN: [name] Date of Birth/Sex: Female) Other Clinician: Primary Care Physician: [name] Treating Raylin Winer Referring Physician: [name] Physician/Extender: Suella Grove in Treatment: 25 Active  Problems ICD-10 Encounter Code Description Active Date Diagnosis L97.812 Non-pressure chronic ulcer of other part of right lower leg 04/30/2016 Yes with fat layer exposed I87.333 Chronic venous hypertension (idiopathic) with ulcer and 04/03/2016 Yes inflammation of bilateral lower extremity I89.0 Lymphedema, not elsewhere classified 04/03/2016 Yes K44.01 Chronic systolic (congestive) heart failure 04/03/2016 Yes Inactive Problems Resolved Problems ICD-10 Code Description Active Date Resolved Date S81.812S Laceration without foreign body, left lower leg, sequela 04/03/2016 04/03/2016 Electronic Signature(s) Signed: 05/21/2016 4:16:16 PM By: Rene Kocher, NP, Breken Nazari Previous Signature: 05/21/2016 3:22:04 PM Version By: Rene Kocher, NP, Trase Bunda Previous Signature: 05/21/2016 3:21:39 PM Version By: Rene Kocher, NP, Markela Wee Entered By: Rene Kocher, NP, Burley Kopka on 05/21/2016 16:16:16 Honsinger, [name] ([id number]) -------------------------------------------------------------------------------- Progress Note Details Patient Name: WARRENE, [name] 05/21/2016 1:30 Date of Service: PM Medical Record [id number] Number: Patient Account Number: [id number] [birth year] (81 y.o. Treating RN: [name] Date of Birth/Sex: Female) Other Clinician: Primary Care Physician: [name] Treating Rene Kocher, Lesia Monica Referring Physician: [name] Physician/Extender: Suella Grove in Treatment: 25 Subjective Chief Complaint Information obtained from Patient Patient here for reevaluation of her right lower externally ulcer History of Present Illness (HPI) 11/22/15; this is Fritsch is a 81 year old woman who lives at home on her own. According to the patient and her daughter was present she has had long-standing edema in her legs dating back many years. She also has a history of chronic systolic heart failure, atrial fibrillation and is status post mitral valve replacement. Last echocardiogram I see in [facility] showed an  ejection fraction of 40-45% she is on Lasix 60 mg a day and spironolactone 25 mg a day. Her current problem began in December around Christmas time she developed a small hematoma in the medial part of her left  leg which rapidly expanded to a very large hematoma that required surgical debridement. This situation was complicated by the fact that the patient is on long-standing Coumadin for mechanical heart valve. She went to the OR had this evacuated on January 4 /17. The wound has gradually improved however she has developed a small wounds around this area and more recently a wound on the right lateral leg. She is weeping edema fluid. The patient is already been to see vascular surgery. It was recommended that she wear Unna boots, she did not tolerate this due to pain in the left leg. She was then prescribed Juzo stockings and really can't get these on herself although truthfully there is probably too much edema for a Juzo stockings currently. She is not a diabetic and has no history of PAD or claudication that I could elicit. She does not use the external compression pumps reliably. She comes today with notes from her primary physician and Dr. Ronalee Belts both recommending various forms of compression but the patient does not really complied with them. Has been using the external compression pumps with some regularity but certainly not daily on the right leg and this has helped. I also note that her daughter tells me the history that she does not sleep in bed at home. She has a hospital bed but with her legs up she finds this painful so she sleeps in the couch sitting up with her legs dependent. 11/29/15; the patient is arrives today accompanied by her son. He expresses satisfaction that she is maintained the compression all week. 12/06/15; the patient has kept her Profore light wraps on, we have good edema control no major change in the wounds we have been using Aquacel 12/13/15; changed to RTD last  week. One of the 3 wounds on her left medial leg is healed she has 2 remaining wounds here and one on the right lateral leg. 12/18/2015 -- the patient was at Dr. Nino [name] office today and he was seeing her for an arterial study. While the wrap was being removed she had an inadvertent laceration of the left proximal anterior leg which bled quite profusely and a compression dressing was applied over this and the patient was here to get her Profore wraps done. I was asked to see the patient to make an assessment and treat appropriately. 12/26/15; the patient's injury on the left proximal leg and Steri-Strips removed after soaking. There is an open area here. The original wounds to still open on the right lateral and left medial leg. [name], [name] ([id number]) 01/03/16 patient's injury on her proximal left leg looks quite good. Still small open area on the medial left leg which appears to be improving. The area on the right lateral leg still substantially open with no real improvement in wound depth. Her edema control is marginal with a Profore light. We have been using RTD for 3-4 weeks without any major change 01/10/16: wounds without s/s of infection. vascular results are pending regarding arterial studies. 01/17/16; patient comes in today complaining of severe pain however I think most of this is in the right hip not related to her wounds. She continues with a oval-shaped wound on the right lateral leg, trauma to the left anterior leg just below her tibial plateau. She has a smaller eschar on the left anterior leg. She is being using Prisma however she informs [place] today that she is actually allergic to silver, nose this from a previous application at [facility] some years ago 01/24/16;  edema is not so well controlled today, I think I reduced her to Profore lites bilaterally last week. The area which was a scissor injury on her left upper anterior lower leg is fully epithelialized. She only has a small  open area remaining on the medial aspect of her left leg. The oval-shaped wound on the right mid lateral leg may be a bit smaller. Debrided of surface slough nonviable subcutaneous tissue. I had changed to collagen 2 weeks ago in an attempt to get this to close 01/31/16 all the patient's wounds on her left leg give healed. We have good edema control with bilateral Profore lites which she has been compliant with. She still has the oval-shaped wound on the right lateral calf allergic even this appears to be slowly improving. The patient has juxtalite stockings at home. She states she thinks she can put these on. She also has external compression pumps at home although I think her compliance with this has not been good in the past. She complains today of edema in her thighs. Tells me she takes 40 mg of Lasix daily 02/21/16; only 1 small wound remains on the lateral aspect of the right calf. She is using Juzo stockings on the left leg although she complains about difficulty in applying them. She has external compression pumps at home 02/28/16; the small open wound on her right lateral calf is improved in terms of wound area. It appears that she has a wrap injury on the anterior aspect of the upper leg 03/06/16; the small open wound on her right lateral calf has a very small open area remaining. The wrap injury on the anterior aspect of the upper leg also appears better. She arrives today in clinic with a history of dyspnea with minimal exertion starting with the last 2-4 days. She does not describe chest pain. Her son and our intake nurse think she has facial swelling. She has a history of an artificial mitral valve on Coumadin 03/13/16; the small wound on the right lateral calf is no better this week. The superior wrap injury anteriorly requires debridement of surface eschar and nonviable subcutaneous tissue. She has been to her primary doctor with regards to her dyspnea we identified last week. Per the  patient's description her Lasix has been increased 03/21/2016 -- patient of Dr. Dellia Nims who could not keep her appointment yesterday but has come in today with the right lower leg looking good and this is the leg which has been treated in the recent past. However, her left lower extremity is extremely swollen, tender and edematous with redness and discoloration. 03/27/16; there is only 2 small areas remain on the right leg. The edema that was so concerning last week has come down however there is extensive bruising on the lateral left leg medial left foot suggesting that she lost some blood into the leg itself. I checked her hemoglobin on 9/21 was 14.5. Her INR was over 6. Duplex ultrasound was negative for DVT 04/03/16 at this point in time today patient is actually doing substantially better in regard to the wound on the anterior right lower extremity. currently there is no slough or eschar noted and no evidence of erythema, discharge, or local infection. She is exhibiting no signs of systemic infection. She is tolerating the dressing changes as well as the wrapping at this point in time. 04/24/16; I have not seen Mrs. Lennartz for 3 weeks. Apparently she was admitted to hospital for respiratory issues/also apparently has had multiple compression fractures  and had kyphoplasty. When she was last here she only had one small open area remaining on the left leg she was using juxtalite stockings. Her son STEPAHNIE, CAMPO ([id number]) is upset about restarting the Coumadin which she blames or the swelling in her legs. She is on Coumadin for prophylaxis with a chronic artificial heart valve. 04/30/16 at this point in time patient has been tolerating the 3 layer compression wrap as well as the calcium alginate. 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 With tthat being said she does have the continued wound of the left lower extremity as well as the right medial lower extremity. The right side  is significantly smaller compared to the left but actually is slough covered. fortunately she has no significant tenderness at rest although with manipulation of the right location of the wound this is significantly tender. 05/07/16 for follow-up evaluation today both the patient's wounds appear to be significantly improved in size compared to last week. She is also having less pain at both locations currently. Her pain level at most is related to be a 1-2 out of 10. She does have some discharge but fortunately no evidence of infection at this point.she also continues to tolerate the compression wraps well. 05/14/16; the patient's wound on the left leg actually looks as though it's on its way to closure. She is using collagen to this area. She has a small punched out area over the right medial calf. The cause of this is not really clear it has probably 0.4 cm of depth. She has an IV in her right hand which she states is for IV fluid when she develops low sodiums her potassiums, the etiology of this is not clear 05-21-16 she presents today with continued ulcerations the right lower extremity, posterior aspect. she has been using Iodoflex and compression therapy to the area. She denies any new injuries or trauma. She does have 2 areas proximal to this ulceration of dry crust along with an area to her left posterior lower leg that is purple discolored area appears similar to how her current ulceration started, she denies any trauma or injury to the left posterior leg we will monitor all of these areas. Objective Constitutional BP within normal limits. afebrile. well nourished; well developed; appears stated age;Marland Kitchen Vitals Time Taken: 1:28 PM, Height: 68 in, Weight: 178 lbs, BMI: 27.1, Temperature: 98.2 F, Pulse: 75 bpm, Respiratory Rate: 16 breaths/min, Blood Pressure: 141/78 mmHg. Respiratory non-labored respiratory effort. Cardiovascular palpable DP, non-palpable PT. hemosiderin staining to  right lower extremity, warm well perfused, capillary refill less than 3 seconds. Musculoskeletal ambulates with cane. Psychiatric appears to make sound judgement and have accurate insight regarding healthcare. oriented to time, place, person and situation. anxious with any intervention. [name], [name] ([id number]) Integumentary (Hair, Skin) Richard tissue throughout ulceration on right lower extremity with central area of slough; proximal to this ulceration there are 2 areas of crust which patient denies any trauma or injury; the posterior left lateral leg there is an area of purple discoloration of the per patient denies any trauma or injury but admits that it is similar in appearance to the origin of the right lower extremity ulcer. no induration, no fluctuance. Wound #6 status is Open. Original cause of wound was Trauma. The wound is located on the Left,Lateral Knee. The wound measures 0cm length x 0cm width x 0cm depth; 0cm^2 area and 0cm^3 volume. The wound is limited to skin breakdown. There is no tunneling or undermining noted.  There is a none present amount of drainage noted. The wound margin is distinct with the outline attached to the wound base. There is no granulation within the wound bed. There is no necrotic tissue within the wound bed. The periwound skin appearance exhibited: Erythema. The periwound skin appearance did not exhibit: Localized Edema, Moist. The surrounding wound skin color is noted with erythema which is circumferential. Periwound temperature was noted as No Abnormality. The periwound has tenderness on palpation. Wound #8 status is Open. Original cause of wound was Trauma. The wound is located on the Right,Medial Lower Leg. The wound measures 0.6cm length x 0.6cm width x 0.3cm depth; 0.283cm^2 area and 0.085cm^3 volume. The wound is limited to skin breakdown. There is no tunneling or undermining noted. There is a large amount of serosanguineous drainage  noted. The wound margin is distinct with the outline attached to the wound base. There is no granulation within the wound bed. There is a large (67-100%) amount of necrotic tissue within the wound bed including Eschar. The periwound skin appearance exhibited: Localized Edema, Moist. Periwound temperature was noted as No Abnormality. The periwound has tenderness on palpation. 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 There was an Open Wound debridement with total area of 0.36 sq cm performed by Lawanda Cousins, NP. with the following instrument(s): Munshi, Verlena C. ([id number]) Blade to remove Viable and Non-Viable tissue/material including Exudate, Fibrin/Slough, and Subcutaneous after achieving pain control using Lidocaine 4% Topical Solution. A time out was conducted at 13:59, prior to the start of the procedure. A Minimum amount of bleeding was controlled with Pressure. The procedure was tolerated well with a pain level of 0 throughout and a pain level of 0 following the procedure. Post Debridement Measurements: 0.6cm length x 0.6cm width x 0.4cm depth; 0.113cm^3 volume. Character of Wound/Ulcer Post Debridement requires further debridement. Severity of Tissue Post Debridement is: Fat layer exposed. Post procedure Diagnosis Wound #8: Same as Pre-Procedure Plan Wound Cleansing: Wound #8 Right,Medial Lower Leg: Clean wound with Normal Saline. Cleanse wound with mild soap and water No tub bath. - give sink or bed baths Anesthetic: Wound #8 Right,Medial Lower Leg: Topical Lidocaine 4% cream applied to wound bed  prior to debridement - for clinic use Primary Wound Dressing: Wound #8 Right,Medial Lower Leg: Iodosorb Ointment Secondary Dressing: Wound #8 Right,Medial Lower Leg: ABD pad Dry Gauze Dressing Change Frequency: Wound #8 Right,Medial Lower Leg: Change dressing every week Follow-up Appointments: Wound #8 Right,Medial Lower Leg: Return Appointment in 1 week. Edema Control: Wound #8 Right,Medial Lower Leg: 3 Layer Compression System - Bilateral Additional Orders / Instructions: Wound #8 Right,Medial Lower Leg: Increase protein intake. General Notes: LLE- Cleanse with soap and water, dry and moisturize, wrap with kerlix and ace wrap, change on Tuesdays and Fridays. [name], [name] ([id number]) Follow-Up Appointments: A follow-up appointment should be scheduled. Medication Reconciliation completed and provided to Patient/Care Provider. A Patient Clinical Summary of Care was provided to MS 1. Will continue with Iodosorb and compression therapy 2. Continue with weekly follow-up appointments 3. patient and daughter will investigate getting new juxta light compression garments for the left lower extremity Electronic Signature(s) Signed: 05/21/2016 4:16:32 PM By:  Tesslyn Baumert, NP, Zan Triska Previous Signature: 05/21/2016 4:14:54 PM Version By: Rene Kocher, NP, Damilola Flamm Previous Signature: 05/21/2016 4:11:57 PM Version By: Rene Kocher, NP, Jana Hakim By: Rene Kocher, NP, Zacharie Portner on 05/21/2016 16:16:32 Whang, [name] ([id number]) -------------------------------------------------------------------------------- SuperBill Details Patient Name: Carrie Mulligan C. Date of Service: 05/21/2016 Medical Record Number: [id number] Patient Account Number: [id number] Date of Birth/Sex: [birth year] (80 y.o. Female) Treating RN: Carolyne Fiscal, Debi Primary Care Physician: [name] Other Clinician: Referring Physician: [name] Treating Physician/Extender: Cathie Olden in Treatment: 25 Diagnosis  Coding ICD-10 Codes Code Description [phone] Laceration without foreign body, left lower leg, sequela Chronic venous hypertension (idiopathic) with ulcer and inflammation of bilateral lower I87.333 extremity L97.812 Non-pressure chronic ulcer of other part of right lower leg with fat layer exposed I89.0 Lymphedema, not elsewhere classified W10.93 Chronic systolic (congestive) heart failure Facility Procedures CPT4: Description Modifier Quantity Code [id number] - DEBRIDE WOUND 1ST 20 SQ CM OR < 1 ICD-10 Description Diagnosis L97.812 Non-pressure chronic ulcer of other part of right lower leg with fat layer exposed Physician Procedures CPT4: Description Modifier Quantity Code [id number] - WC PHYS DEBR WO ANESTH 20 SQ CM 1 ICD-10 Description Diagnosis L97.812 Non-pressure chronic ulcer of other part of right lower leg with fat layer exposed Electronic Signature(s) Signed: 05/21/2016 4:16:49 PM By: Rene Kocher, NP, Wisdom Rickey Previous Signature: 05/21/2016 4:15:39 PM Version By: Rene Kocher, NP, Tahtiana Rozier Entered By: Rene Kocher, NP, Lavender Stanke on 05/21/2016 16:16:48

## 2016-05-29 ENCOUNTER — Non-Acute Institutional Stay (SKILLED_NURSING_FACILITY): Payer: Medicare Other | Admitting: Gerontology

## 2016-05-29 DIAGNOSIS — I872 Venous insufficiency (chronic) (peripheral): Secondary | ICD-10-CM

## 2016-05-29 DIAGNOSIS — R52 Pain, unspecified: Secondary | ICD-10-CM | POA: Diagnosis not present

## 2016-05-29 NOTE — Progress Notes (Signed)
[name] ([id number]) Visit Report for 05/28/2016 Arrival Information Details Patient Name: [name], [name]. Date of Service: 05/28/2016 12:45 PM Medical Record Patient Account Number: [id number] Number: Treating RN: [name] April 06, 1935 (80 y.o. Other Clinician: Date of Birth/Sex: Female) Treating [name], [name] Primary Care Physician/Extender: Claudette Laws Physician: Referring Physician: [name] in Treatment: 26 Visit Information History Since Last Visit All ordered tests and consults were completed: No Patient Arrived: Wheel Chair Added or deleted any medications: No Arrival Time: 12:55 Any new allergies or adverse reactions: No Accompanied By: self Had a fall or experienced change in No Transfer Assistance: EasyPivot Patient activities of daily living that may affect Lift risk of falls: Patient Identification Verified: Yes Signs or symptoms of abuse/neglect since last No Secondary Verification Process Yes visito Completed: Hospitalized since last visit: No Patient Requires Transmission- No Pain Present Now: No Based Precautions: Patient Has Alerts: Yes Patient Alerts: Patient on Blood Thinner Laureles Signature(s) Signed: 05/28/2016 5:55:17 PM By: [name] Entered By: [name] on 05/28/2016 12:56:12 Vanderslice, [name] ([id number]) -------------------------------------------------------------------------------- Encounter Discharge Information Details Patient Name: Carrie Mulligan C. Date of Service: 05/28/2016 12:45 PM Medical Record Patient Account Number: [id number] Number: Treating RN: [name] 08-02-1934 (80 y.o. Other Clinician: Date of Birth/Sex: Female) Treating [name], [name] Primary Care Physician/Extender: Claudette Laws Physician: Referring Physician: [name] in Treatment: 26 Encounter Discharge Information  Items Discharge Pain Level: 0 Discharge Condition: Stable Ambulatory Status: Wheelchair Discharge Destination: Nursing Home Transportation: Private Auto Accompanied By: caregiver Schedule Follow-up Appointment: Yes Medication Reconciliation completed and provided to Patient/Care Yes Ariann Khaimov: Provided on Clinical Summary of Care: 05/28/2016 Form Type Recipient Paper Patient MS Electronic Signature(s) Signed: 05/28/2016 1:49:13 PM By: Ruthine Dose Entered By: Ruthine Dose on 05/28/2016 13:49:13 Lana, [name] ([id number]) -------------------------------------------------------------------------------- General Visit Notes Details Patient Name: Carrie Mulligan C. Date of Service: 05/28/2016 12:45 PM Medical Record Patient Account Number: [id number] Number: Treating RN: [name] [birth year] (80 y.o. Other Clinician: Date of Birth/Sex: Female) Treating [name], [name] Primary Care Physician/Extender: Claudette Laws Physician: Referring Physician: [name] in Treatment: 26 Notes I called the rehab facility the pt is living at [facility] on [phone] and asked for pts nurse and I spoke to Gardi. I explained to her that the pt had left already and that Dr. Dellia Nims had ordered for pt to have Prisma on her wound under her kerlix/coban wrap and pt is allergic to silver. I had already spoke to Dr. Dellia Nims about what had happened and he said to have the nurse a the facility change the wrap and dressing to plain collagen. The nurse Benjamine Mola said that was fine she would do that as soon as the pt got back and I faxed her the new orders. Benjamine Mola a few minutes later called back and stated that they did not have plain collagen at their facility and did not carry it at all. I told Dr. Dellia Nims about that as well and she said they had plain aquacel. Dr. Dellia Nims said that was fine to use just NO SILVER on the pts wound. I sent again the new  orders back over to the facility. Benjamine Mola stated that she would change the wrap and wound dressing to the plain aquacel. Electronic Signature(s) Signed: 05/28/2016 5:55:17 PM By: [name] Entered By: [name] on 05/28/2016 17:07:56 Ishee, [name] ([id number]) -------------------------------------------------------------------------------- Lower Extremity Assessment Details Patient Name: [name], [name] Date of Service: 05/28/2016 12:45  PM Medical Record Patient Account Number: [id number] Number: Treating RN: [name] September 15, 1934 (80 y.o. Other Clinician: Date of Birth/Sex: Female) Treating [name], [name] Primary Care Physician/Extender: Claudette Laws Physician: Referring Physician: [name] in Treatment: 26 Edema Assessment Assessed: [Left: No] [Right: No] E[Left: dema] [Right: :] Calf Left: Right: Point of Measurement: 34 cm From Medial Instep cm 30.5 cm Ankle Left: Right: Point of Measurement: 10 cm From Medial Instep cm 21.5 cm Vascular Assessment Pulses: Posterior Tibial Dorsalis Pedis Palpable: [Right:Yes] Extremity colors, hair growth, and conditions: Extremity Color: [Right:Mottled] Temperature of Extremity: [Right:Warm] Capillary Refill: [Right:< 3 seconds] Toe Nail Assessment Left: Right: Thick: Yes Discolored: Yes Deformed: No Improper Length and Hygiene: No Electronic Signature(s) Signed: 05/28/2016 5:55:17 PM By: [name] Entered By: [name] on 05/28/2016 13:02:25 Zettel, [name] ([id number]) Klemann, [place]. ([id number]) -------------------------------------------------------------------------------- Multi Wound Chart Details Patient Name: Polly [name], [name] Date of Service: 05/28/2016 12:45 PM Medical Record Patient Account Number: [id number] Number: Treating RN: [name] Mar 04, 1935 (80 y.o. Other Clinician: Date of Birth/Sex: Female) Treating  [name], [name] Primary Care Physician/Extender: Claudette Laws Physician: Referring Physician: [name] in Treatment: 26 Vital Signs Height(in): 68 Pulse(bpm): 71 Weight(lbs): 178 Blood Pressure 134/66 (mmHg): Body Mass Index(BMI): 27 Temperature(F): 97.7 Respiratory Rate 16 (breaths/min): Photos: [N/A:N/A] Wound Location: Right Lower Leg - Medial N/A N/A Wounding Event: Trauma N/A N/A Primary Etiology: Trauma, Other N/A N/A Comorbid History: Cataracts, Arrhythmia, N/A N/A Hypotension, Osteoarthritis Date Acquired: 04/06/2016 N/A N/A Weeks of Treatment: 4 N/A N/A Wound Status: Open N/A N/A Measurements L x W x D 0.7x1.3x0.2 N/A N/A (cm) Area (cm) : 0.715 N/A N/A Volume (cm) : 0.143 N/A N/A % Reduction in Area: -225.00% N/A N/A % Reduction in Volume: -550.00% N/A N/A Classification: Partial Thickness N/A N/A Exudate Amount: Large N/A N/A Exudate Type: Serosanguineous N/A N/A Exudate Color: red, brown N/A N/A Wound Margin: Distinct, outline attached N/A N/A Amer, Chia C. ([id number]) Granulation Amount: None Present (0%) N/A N/A Necrotic Amount: Large (67-100%) N/A N/A Necrotic Tissue: Eschar, Adherent Slough N/A N/A Exposed Structures: Fascia: No N/A N/A Fat: No Tendon: No Muscle: No Joint: No Bone: No Limited to Skin Breakdown Epithelialization: None N/A N/A Periwound Skin Texture: Edema: Yes N/A N/A Periwound Skin Moist: Yes N/A N/A Moisture: Periwound Skin Color: Erythema: Yes N/A N/A Erythema Location: Circumferential N/A N/A Temperature: No Abnormality N/A N/A Tenderness on Yes N/A N/A Palpation: Wound Preparation: Ulcer Cleansing: Other: N/A N/A soap and water Topical Anesthetic Applied: Other: lidocaine 4% Treatment Notes Electronic Signature(s) Signed: 05/28/2016 5:55:17 PM By: [name] Entered By: [name] on 05/28/2016 13:27:44 Latouche, [name]  ([id number]) -------------------------------------------------------------------------------- Multi-Disciplinary Care Plan Details Patient Name: [name] Date of Service: 05/28/2016 12:45 PM Medical Record Patient Account Number: [id number] Number: Treating RN: [name] [birth year] (80 y.o. Other Clinician: Date of Birth/Sex: Female) Treating [name], [name] Primary Care Physician/Extender: Claudette Laws Physician: Referring Physician: [name] in Treatment: 26 Active Inactive Orientation to the Wound Care Program Nursing Diagnoses: Knowledge deficit related to the wound healing center program Goals: Patient/caregiver will verbalize understanding of the [facility] Program Date Initiated: 11/22/2015 Goal Status: Active Interventions: Provide education on orientation to the wound center Notes: Venous Leg Ulcer Nursing Diagnoses: Knowledge deficit related to disease process and management Potential for venous Insuffiency (use before diagnosis confirmed) Goals: Patient will maintain optimal edema control Date Initiated: 11/22/2015 Goal Status: Active Patient/caregiver will verbalize understanding of disease process  and disease management Date Initiated: 11/22/2015 Goal Status: Active Verify adequate tissue perfusion prior to therapeutic compression application Date Initiated: 11/22/2015 Goal Status: Active Interventions: Assess peripheral edema status every visit. BARBIE, CROSTON ([id number]) Compression as ordered Provide education on venous insufficiency Treatment Activities: Non-invasive vascular studies : 11/22/2015 Therapeutic compression applied : 11/22/2015 Notes: Wound/Skin Impairment Nursing Diagnoses: Impaired tissue integrity Knowledge deficit related to ulceration/compromised skin integrity Goals: Patient/caregiver will verbalize understanding of skin care regimen Date Initiated: 11/22/2015 Goal Status:  Active Ulcer/skin breakdown will have a volume reduction of 30% by week 4 Date Initiated: 11/22/2015 Goal Status: Active Ulcer/skin breakdown will have a volume reduction of 50% by week 8 Date Initiated: 11/22/2015 Goal Status: Active Ulcer/skin breakdown will have a volume reduction of 80% by week 12 Date Initiated: 11/22/2015 Goal Status: Active Ulcer/skin breakdown will heal within 14 weeks Date Initiated: 11/22/2015 Goal Status: Active Interventions: Assess patient/caregiver ability to perform ulcer/skin care regimen upon admission and as needed Assess ulceration(s) every visit Provide education on ulcer and skin care Treatment Activities: Skin care regimen initiated : 11/22/2015 Topical wound management initiated : 11/22/2015 Notes: Electronic Signature(s) Signed: 05/28/2016 5:55:17 PM By: De Burrs, [name] ([id number]) Entered By: [name] on 05/28/2016 13:10:04 Coreas, [name] ([id number]) -------------------------------------------------------------------------------- Pain Assessment Details Patient Name: Carrie Mulligan C. Date of Service: 05/28/2016 12:45 PM Medical Record Patient Account Number: [id number] Number: Treating RN: [name] October 23, 1934 (80 y.o. Other Clinician: Date of Birth/Sex: Female) Treating [name], [name] Primary Care Physician/Extender: Claudette Laws Physician: Referring Physician: [name] in Treatment: 26 Active Problems Location of Pain Severity and Description of Pain Patient Has Paino No Site Locations With Dressing Change: No Pain Management and Medication Current Pain Management: Electronic Signature(s) Signed: 05/28/2016 5:55:17 PM By: [name] Entered By: [name] on 05/28/2016 12:75:17 Lorenson, [name] ([id number]) -------------------------------------------------------------------------------- Patient/Caregiver Education Details Patient  Name: [name] Date of Service: 05/28/2016 12:45 PM Medical Record Patient Account Number: [id number] Number: Treating RN: [name] [birth year] (80 y.o. Other Clinician: Date of Birth/Gender: Female) Treating [name], [name] Primary Care Physician/Extender: Claudette Laws Physician: Suella [place] in Treatment: 26 Referring Physician: [name] Education Assessment Education Provided To: Patient Education Topics Provided Wound/Skin Impairment: Handouts: Other: change dressing as ordered and do not get dressing wet Methods: Demonstration, Explain/Verbal Responses: State content correctly Electronic Signature(s) Signed: 05/28/2016 5:55:17 PM By: [name] Entered By: [name] on 05/28/2016 13:11:25 Tetterton, Dangela C. ([id number]) -------------------------------------------------------------------------------- Wound Assessment Details Patient Name: Polly [name], Hattye C. Date of Service: 05/28/2016 12:45 PM Medical Record Patient Account Number: [id number] Number: Treating RN: [name] 1935-06-04 (80 y.o. Other Clinician: Date of Birth/Sex: Female) Treating [name], [name] Primary Care Physician/Extender: Claudette Laws Physician: Referring Physician: [name] in Treatment: 26 Wound Status Wound Number: 8 Primary Trauma, Other Etiology: Wound Location: Right Lower Leg - Medial Wound Status: Open Wounding Event: Trauma Comorbid Cataracts, Arrhythmia, Hypotension, Date Acquired: 04/06/2016 History: Osteoarthritis Weeks Of Treatment: 4 Clustered Wound: No Photos Photo Uploaded By: [name] on 05/28/2016 13:12:15 Wound Measurements Length: (cm) 0.7 % Reduction i Width: (cm) 1.3 % Reduction i Depth: (cm) 0.2 Epithelializa Area: (cm) 0.715 Tunneling: Volume: (cm) 0.143 Undermining: n Area: -225% n Volume: -550% tion: None No No Wound Description Classification: Partial Thickness Wound  Margin: Distinct, outline attached Exudate Amount: Large Exudate Type: Serosanguineous Exudate Color: red, brown Foul Odor After Cleansing: No Wound Bed Granulation Amount: None Present (0%) Exposed Structure Necrotic Amount: Large (67-100%) Fascia  Exposed: No Derenzo, Kashish C. ([id number]) Necrotic Quality: Eschar, Adherent Slough Fat Layer Exposed: No Tendon Exposed: No Muscle Exposed: No Joint Exposed: No Bone Exposed: No Limited to Skin Breakdown Periwound Skin Texture Texture Color No Abnormalities Noted: No No Abnormalities Noted: No Localized Edema: Yes Erythema: Yes Erythema Location: Circumferential Moisture No Abnormalities Noted: No Temperature / Pain Moist: Yes Temperature: No Abnormality Tenderness on Palpation: Yes Wound Preparation Ulcer Cleansing: Other: soap and water, Topical Anesthetic Applied: Other: lidocaine 4%, Treatment Notes Wound #8 (Right, Medial Lower Leg) 1. Cleansed with: Clean wound with Normal Saline Cleanse wound with antibacterial soap and water 2. Anesthetic Topical Lidocaine 4% cream to wound bed prior to debridement 3. Peri-wound Care: Moisturizing lotion 4. Dressing Applied: Prisma Ag 5. Secondary Dressing Applied ABD Pad Dry Gauze 7. Secured with Tape Notes kerlix and Event organiser) Signed: 05/28/2016 5:55:17 PM By: [name] Entered By: [name] on 05/28/2016 13:08:04 Kitzmiller, [name] ([id number]) -------------------------------------------------------------------------------- Vitals Details Patient Name: Carrie Mulligan C. Date of Service: 05/28/2016 12:45 PM Medical Record Patient Account Number: [id number] Number: Treating RN: [name] 1935/05/08 (80 y.o. Other Clinician: Date of Birth/Sex: Female) Treating [name], [name] Primary Care Physician/Extender: Claudette Laws Physician: Referring Physician: [name] in Treatment: 26 Vital  Signs Time Taken: 12:56 Temperature (F): 97.7 Height (in): 68 Pulse (bpm): 71 Weight (lbs): 178 Respiratory Rate (breaths/min): 16 Body Mass Index (BMI): 27.1 Blood Pressure (mmHg): 134/66 Reference Range: 80 - 120 mg / dl Electronic Signature(s) Signed: 05/28/2016 5:55:17 PM By: [name] Entered By: [name] on 05/28/2016 12:56:34

## 2016-05-29 NOTE — Progress Notes (Signed)
YASUKO, LAPAGE ([id number]) Visit Report for 05/28/2016 Chief Complaint Document Details LOUELLEN, HALDEMAN Date of Service: 05/28/2016 12:45 PM Patient Name: C. Patient Account Number: [id number] Medical Record Treating RN: [name] [id number] Number: Other Clinician: 03-06-1935 (81 y.o. Treating Darianna Amy, Joseph Date of Birth/Sex: Female) Physician/Extender: G Primary Care [name] Physician: Referring Physician: [name] in Treatment: 26 Information Obtained from: Patient Chief Complaint Patient here for reevaluation of her right lower externally ulcer Electronic Signature(s) Signed: 05/28/2016 6:11:07 PM By: [name] MD Entered By: [name] on 05/28/2016 14:56:41 Bleicher, [name] ([id number]) -------------------------------------------------------------------------------- Debridement Details [name] Date of Service: 05/28/2016 12:45 PM Patient Name: C. Patient Account Number: [id number] Medical Record Treating RN: [name] [id number] Number: Other Clinician: 1935/03/24 (81 y.o. Treating Aydan Levitz, Lydia Date of Birth/Sex: Female) Physician/Extender: G Primary Care [name] Physician: Referring Physician: [name] in Treatment: 26 Debridement Performed for Wound #8 Right,Medial Lower Leg Assessment: Performed By: Physician [name], MD Debridement: Debridement Pre-procedure Yes - 13:27 Verification/Time Out Taken: Start Time: 13:28 Pain Control: Lidocaine 4% Topical Solution Level: Skin/Subcutaneous Tissue Total Area Debrided (L x 0.7 (cm) x 1.3 (cm) = 0.91 (cm) W): Tissue and other Viable, Non-Viable, Exudate, Fibrin/Slough, Subcutaneous material debrided: Instrument: Curette Specimen: Swab Number of Specimens 1 Taken: Bleeding: Minimum Hemostasis Achieved: Pressure End Time: 13:30 Procedural Pain: 0 Post Procedural Pain: 0 Response to Treatment: Procedure was tolerated  well Post Debridement Measurements of Total Wound Length: (cm) 0.7 Width: (cm) 1.3 Depth: (cm) 0.3 Volume: (cm) 0.214 Character of Wound/Ulcer Post Requires Further Debridement Debridement: Severity of Tissue Post Debridement: Fat layer exposed Post Procedure Diagnosis Same as Pre-procedure SAKIA, SCHRIMPF ([id number]) Electronic Signature(s) Signed: 05/28/2016 5:55:17 PM By: [name] Signed: 05/28/2016 6:11:07 PM By: [name] MD Entered By: [name] on 05/28/2016 14:56:19 Mizer, [name] ([id number]) -------------------------------------------------------------------------------- HPI Details [name] Date of Service: 05/28/2016 12:45 PM Patient Name: C. Patient Account Number: [id number] Medical Record Treating RN: [name] [id number] Number: Other Clinician: [birth year] (81 y.o. Treating Montanna Mcbain, [place] Date of Birth/Sex: Female) Physician/Extender: G [place] Physician: Referring Physician: [name] in Treatment: 26 History of Present Illness HPI Description: 11/22/15; this is [place] is a 81 year old woman who lives at home on her own. According to the patient and her daughter was present she has had long-standing edema in her legs dating back many years. She also has a history of chronic systolic heart failure, atrial fibrillation and is status post mitral valve replacement. Last echocardiogram I see in [facility] showed an ejection fraction of 40-45% she is on Lasix 60 mg a day and spironolactone 25 mg a day. Her current problem began in December around Christmas time she developed a small hematoma in the medial part of her left leg which rapidly expanded to a very large hematoma that required surgical debridement. This situation was complicated by the fact that the patient is on long-standing Coumadin for mechanical heart valve. She went to the OR had this evacuated on January 4 /17. The  wound has gradually improved however she has developed a small wounds around this area and more recently a wound on the right lateral leg. She is weeping edema fluid. The patient is already been to see vascular surgery. It was recommended that she wear Unna boots, she did not tolerate this due to pain in the left leg. She was then prescribed Juzo stockings and really can't get these on herself although truthfully there is probably  too much edema for a Juzo stockings currently. She is not a diabetic and has no history of PAD or claudication that I could elicit. She does not use the external compression pumps reliably. She comes today with notes from her primary physician and Dr. Ronalee Belts both recommending various forms of compression but the patient does not really complied with them. Has been using the external compression pumps with some regularity but certainly not daily on the right leg and this has helped. I also note that her daughter tells me the history that she does not sleep in bed at home. She has a hospital bed but with her legs up she finds this painful so she sleeps in the couch sitting up with her legs dependent. 11/29/15; the patient is arrives today accompanied by her son. He expresses satisfaction that she is maintained the compression all week. 12/06/15; the patient has kept her Profore light wraps on, we have good edema control no major change in the wounds we have been using Aquacel 12/13/15; changed to RTD last week. One of the 3 wounds on her left medial leg is healed she has 2 remaining wounds here and one on the right lateral leg. 12/18/2015 -- the patient was at Dr. Nino Parsley office today and he was seeing her for an arterial study. While the wrap was being removed she had an inadvertent laceration of the left proximal anterior leg which bled quite profusely and a compression dressing was applied over this and the patient was here to get her Profore wraps done. I was asked to  see the patient to make an assessment and treat appropriately. 12/26/15; the patient's injury on the left proximal leg and Steri-Strips removed after soaking. There is an open area here. The original wounds to still open on the right lateral and left medial leg. 01/03/16 patient's injury on her proximal left leg looks quite good. Still small open area on the medial left leg which appears to be improving. The area on the right lateral leg still substantially open with no real improvement in wound depth. Her edema control is marginal with a Profore light. We have been using RTD Stueve, Joelene C. ([id number]) for 3-4 weeks without any major change 01/10/16: wounds without s/s of infection. vascular results are pending regarding arterial studies. 01/17/16; patient comes in today complaining of severe pain however I think most of this is in the right hip not related to her wounds. She continues with a oval-shaped wound on the right lateral leg, trauma to the left anterior leg just below her tibial plateau. She has a smaller eschar on the left anterior leg. She is being using Prisma however she informs [place] today that she is actually allergic to silver, nose this from a previous application at [facility] some years ago 01/24/16; edema is not so well controlled today, I think I reduced her to [place] bilaterally last week. The area which was a scissor injury on her left upper anterior lower leg is fully epithelialized. She only has a small open area remaining on the medial aspect of her left leg. The oval-shaped wound on the right mid lateral leg may be a bit smaller. Debrided of surface slough nonviable subcutaneous tissue. I had changed to collagen 2 weeks ago in an attempt to get this to close 01/31/16 all the patient's wounds on her left leg give healed. We have good edema control with bilateral Profore lites which she has been compliant with. She still has the oval-shaped wound  on the right lateral calf  allergic even this appears to be slowly improving. The patient has juxtalite stockings at home. She states she thinks she can put these on. She also has external compression pumps at home although I think her compliance with this has not been good in the past. She complains today of edema in her thighs. Tells me she takes 40 mg of Lasix daily 02/21/16; only 1 small wound remains on the lateral aspect of the right calf. She is using Juzo stockings on the left leg although she complains about difficulty in applying them. She has external compression pumps at home 02/28/16; the small open wound on her right lateral calf is improved in terms of wound area. It appears that she has a wrap injury on the anterior aspect of the upper leg 03/06/16; the small open wound on her right lateral calf has a very small open area remaining. The wrap injury on the anterior aspect of the upper leg also appears better. She arrives today in clinic with a history of dyspnea with minimal exertion starting with the last 2-4 days. She does not describe chest pain. Her son and our intake nurse think she has facial swelling. She has a history of an artificial mitral valve on Coumadin 03/13/16; the small wound on the right lateral calf is no better this week. The superior wrap injury anteriorly requires debridement of surface eschar and nonviable subcutaneous tissue. She has been to her primary doctor with regards to her dyspnea we identified last week. Per the patient's description her Lasix has been increased 03/21/2016 -- patient of Dr. Dellia Nims who could not keep her appointment yesterday but has come in today with the right lower leg looking good and this is the leg which has been treated in the recent past. However, her left lower extremity is extremely swollen, tender and edematous with redness and discoloration. 03/27/16; there is only 2 small areas remain on the right leg. The edema that was so concerning last week has  come down however there is extensive bruising on the lateral left leg medial left foot suggesting that she lost some blood into the leg itself. I checked her hemoglobin on 9/21 was 14.5. Her INR was over 6. Duplex ultrasound was negative for DVT 04/03/16 at this point in time today patient is actually doing substantially better in regard to the wound on the anterior right lower extremity. currently there is no slough or eschar noted and no evidence of erythema, discharge, or local infection. She is exhibiting no signs of systemic infection. She is tolerating the dressing changes as well as the wrapping at this point in time. 04/24/16; I have not seen Mrs. Holdren for 3 weeks. Apparently she was admitted to hospital for respiratory issues/also apparently has had multiple compression fractures and had kyphoplasty. When she was last here she only had one small open area remaining on the left leg she was using juxtalite stockings. Her son is upset about restarting the Coumadin which she blames or the swelling in her legs. She is on Coumadin for prophylaxis with a chronic artificial heart valve. AURIEL, KIST ([id number]) 04/30/16 at this point in time patient has been tolerating the 3 layer compression wrap as well as the calcium alginate. 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 With tthat being said she does have the continued wound of the left lower extremity as well as the right medial lower extremity. The right side is significantly smaller compared to the left  but actually is slough covered. fortunately she has no significant tenderness at rest although with manipulation of the right location of the wound this is significantly tender. 05/07/16 for follow-up evaluation today both the patient's wounds appear to be significantly improved in size compared to last week. She is also having less pain at both locations currently. Her pain level at most is related to be a 1-2 out of 10. She  does have some discharge but fortunately no evidence of infection at this point.she also continues to tolerate the compression wraps well. 05/14/16; the patient's wound on the left leg actually looks as though it's on its way to closure. She is using collagen to this area. She has a small punched out area over the right medial calf. The cause of this is not really clear it has probably 0.4 cm of depth. She has an IV in her right hand which she states is for IV fluid when she develops low sodiums her potassiums, the etiology of this is not clear 05-21-16 she presents today with continued ulcerations the right lower extremity, posterior aspect. she has been using Iodoflex and compression therapy to the area. She denies any new injuries or trauma. She does have 2 areas proximal to this ulceration of dry crust along with an area to her left posterior lower leg that is purple discolored area appears similar to how her current ulceration started, she denies any trauma or injury to the left posterior leg we will monitor all of these areas. 05/28/16; the patient is down to 1 small open area on the right medial mid calf. This is however larger and in 50% of the surface area deeper approximately 0.4 cm. The reason for this deterioration is unclear. I've gone ahead and done a culture of this area she also tells [place] today that she is short of breath. She is also noted a swelling on her lower left eyelid Electronic Signature(s) Signed: 05/28/2016 6:11:07 PM By: [name] MD Entered By: [name] on 05/28/2016 14:58:28 Buddenhagen, [name] ([id number]) -------------------------------------------------------------------------------- Physical Exam Details [name] Date of Service: 05/28/2016 12:45 PM Patient Name: C. Patient Account Number: [id number] Medical Record Treating RN: [name] [id number] Number: Other Clinician: 04-21-1935 (81 y.o. Treating Aleasha Fregeau Date of  Birth/Sex: Female) Physician/Extender: G Primary Care [name] Physician: Referring Physician: [name] in Treatment: 26 Constitutional Sitting or standing Blood Pressure is within target range for patient.. Pulse regular and within target range for patient.Marland Kitchen Respirations regular, non-labored and within target range.. Temperature is normal and within the target range for the patient.. Patient's appearance is neat and clean. Appears in no acute distress. Well nourished and well developed.. Eyes Hordeolum on the lower left eyelid. Otherwise eyes are normal. Respiratory Respiratory effort is easy and symmetric bilaterally. Rate is normal at rest and on room air.. Bilateral breath sounds are clear and equal in all lobes with no wheezes, rales or rhonchi.. Cardiovascular Mechanical first sound no murmurs JVP is not elevated. Pedal pulses palpable and strong bilaterally.. Gastrointestinal (GI) Abdomen is soft and non-distended without masses or tenderness. Bowel sounds active in all quadrants.. Lymphatic Done palpable in the popliteal or inguinal area. Psychiatric Patient appears depressed today.. Notes Wound exam; the patient has a small open area on the medial right calf mid aspect. There are no open wounds on the left she has an Ace wrap on this area. The small open wound on the right has about a 50% deep opening going down 0.4 cm.  This is apparently worse from last week. Because of this I have done a culture of the wound area. Debridement was done with a #3 curette to remove surface slough and nonviable subcutaneous tissue Electronic Signature(s) Signed: 05/28/2016 6:11:07 PM By: [name] MD Entered By: [name] on 05/28/2016 15:01:56 Person, [name] ([id number]) -------------------------------------------------------------------------------- Physician Orders Details [name] Date of Service: 05/28/2016 12:45 PM Patient Name: C. Patient  Account Number: [id number] Medical Record Treating RN: [name] [id number] Number: Other Clinician: 19-Sep-1934 (81 y.o. Treating David Rodriquez, [place] Date of Birth/Sex: Female) Physician/Extender: G Primary Care [name] Physician: Referring Physician: [name] in Treatment: 26 Verbal / Phone Orders: Yes Clinician: Carolyne Fiscal, Debi Read Back and Verified: Yes Diagnosis Coding Wound Cleansing Wound #8 Right,Medial Lower Leg o Clean wound with Normal Saline. o Cleanse wound with mild soap and water o No tub bath. - give sink or bed baths Anesthetic Wound #8 Right,Medial Lower Leg o Topical Lidocaine 4% cream applied to wound bed prior to debridement - for clinic use Skin Barriers/Peri-Wound Care Wound #8 Right,Medial Lower Leg o Moisturizing lotion Primary Wound Dressing Wound #8 Right,Medial Lower Leg o Aquacel - NO SILVER Secondary Dressing Wound #8 Right,Medial Lower Leg o ABD pad o Dry Gauze Dressing Change Frequency Wound #8 Right,Medial Lower Leg o Change dressing every week Follow-up Appointments Wound #8 Right,Medial Lower Leg o Return Appointment in 1 week. IDOLINA, MANTELL ([id number]) Edema Control Wound #8 Right,Medial Lower Leg o Kerlix and Coban - Right Lower Extremity Additional Orders / Instructions Wound #8 Right,Medial Lower Leg o Increase protein intake. o Other: - Left leg kerlix and ace wrap change on Tuesdays and Fridays by RN until pt get her own compression stockings/wraps. LLE- Cleanse with soap and water, dry and moisturize, wrap with kerlix and ace wrap. Laboratory o Bacteria identified in Wound by Culture (MICRO) - right medial lower leg...done in clinic oooo LOINC Code: [id number] oooo Convenience Name: Wound culture routine Electronic Signature(s) Signed: 05/28/2016 5:55:17 PM By: [name] Signed: 05/28/2016 6:11:07 PM By: [name] MD Entered By: [name] on 05/28/2016  14:47:27 Quinlivan, [name] ([id number]) -------------------------------------------------------------------------------- Problem List Details [name] Date of Service: 05/28/2016 12:45 PM Patient Name: C. Patient Account Number: [id number] Medical Record Treating RN: [name] [id number] Number: Other Clinician: Dec 06, 1934 (81 y.o. Treating Leon Goodnow Date of Birth/Sex: Female) Physician/Extender: G Primary Care [name] Physician: Referring Physician: [name] in Treatment: 26 Active Problems ICD-10 Encounter Code Description Active Date Diagnosis L97.812 Non-pressure chronic ulcer of other part of right lower leg 04/30/2016 Yes with fat layer exposed I87.333 Chronic venous hypertension (idiopathic) with ulcer and 04/03/2016 Yes inflammation of bilateral lower extremity I89.0 Lymphedema, not elsewhere classified 04/03/2016 Yes N27.78 Chronic systolic (congestive) heart failure 04/03/2016 Yes Inactive Problems Resolved Problems ICD-10 Code Description Active Date Resolved Date S81.812S Laceration without foreign body, left lower leg, sequela 04/03/2016 04/03/2016 Electronic Signature(s) Signed: 05/28/2016 6:11:07 PM By: [name] MD Entered By: [name] on 05/28/2016 14:56:05 Wolk, [name] ([id number]) -------------------------------------------------------------------------------- Progress Note Details [name] Date of Service: 05/28/2016 12:45 PM Patient Name: C. Patient Account Number: [id number] Medical Record Treating RN: [name] [id number] Number: Other Clinician: Aug 11, 1934 (81 y.o. 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 According to the patient and her daughter was present she has had long-standing edema in her legs dating back many years. She also has a history of chronic systolic heart failure, atrial fibrillation and is status post mitral valve replacement. Last echocardiogram I see in [facility] showed an ejection fraction of 40-45% she is on Lasix 60 mg a day and spironolactone 25 mg a day. Her current problem began in December around Christmas time she developed a small hematoma in the medial part of her left leg which rapidly expanded to a very large hematoma that required surgical debridement. This situation was complicated by the fact that the patient is on long-standing Coumadin for mechanical heart valve. She went to the OR had this evacuated on January 4 /17. The wound has gradually improved however she has developed a small wounds around this area and more recently a wound on the right lateral leg. She is weeping edema fluid. The patient is already been to see vascular surgery. It was recommended that she wear Unna boots, she did not tolerate this due to pain in the left leg. She was then prescribed Juzo stockings and really can't get these on herself although truthfully there is probably too much edema for a Juzo stockings currently. She is not a diabetic and has no history of PAD or claudication that I could elicit. She does not use the external compression pumps reliably. She comes today with notes from her primary physician and Dr. Ronalee Belts both recommending various forms of compression but the patient does not really complied with them. Has been using the external compression pumps with some regularity but certainly not daily on the right leg and this has helped. I also note that her daughter tells me the history that she does not sleep in bed at home. She has a hospital bed but with her legs  up she finds this painful so she sleeps in the couch sitting up with her legs dependent. 11/29/15; the patient is arrives today accompanied by her son. He expresses satisfaction that she is maintained the compression all week. 12/06/15; the patient has kept her Profore light wraps on, we have good edema control no major change in the wounds we have been using Aquacel 12/13/15; changed to RTD last week. One of the 3 wounds on her left medial leg is healed she has 2 remaining wounds here and one on the right lateral leg. 12/18/2015 -- the patient was at Dr. Nino Parsley office today and he was seeing her for an arterial study. While the wrap was being removed she had an inadvertent laceration of the left proximal anterior leg which bled quite profusely and a compression dressing was applied over this and the patient was here to get her Profore wraps done. I was asked to see the patient to make an assessment and treat appropriately. AMAKA, GLUTH ([id number]) 12/26/15; the patient's injury on the left proximal leg and Steri-Strips removed after soaking. There is an open area here. The original wounds to still open on the right lateral and left medial leg. 01/03/16 patient's injury on her proximal left leg looks quite good. Still small open area on the medial left leg which appears to be improving. The area on the right lateral leg still substantially open with no real improvement in wound depth. Her edema control is marginal with a Profore light. We have been using RTD for 3-4 weeks without any major change  01/10/16: wounds without s/s of infection. vascular results are pending regarding arterial studies. 01/17/16; patient comes in today complaining of severe pain however I think most of this is in the right hip not related to her wounds. She continues with a oval-shaped wound on the right lateral leg, trauma to the left anterior leg just below her tibial plateau. She has a smaller eschar on the left  anterior leg. She is being using Prisma however she informs [place] today that she is actually allergic to silver, nose this from a previous application at [facility] some years ago 01/24/16; edema is not so well controlled today, I think I reduced her to [place] bilaterally last week. The area which was a scissor injury on her left upper anterior lower leg is fully epithelialized. She only has a small open area remaining on the medial aspect of her left leg. The oval-shaped wound on the right mid lateral leg may be a bit smaller. Debrided of surface slough nonviable subcutaneous tissue. I had changed to collagen 2 weeks ago in an attempt to get this to close 01/31/16 all the patient's wounds on her left leg give healed. We have good edema control with bilateral Profore lites which she has been compliant with. She still has the oval-shaped wound on the right lateral calf allergic even this appears to be slowly improving. The patient has juxtalite stockings at home. She states she thinks she can put these on. She also has external compression pumps at home although I think her compliance with this has not been good in the past. She complains today of edema in her thighs. Tells me she takes 40 mg of Lasix daily 02/21/16; only 1 small wound remains on the lateral aspect of the right calf. She is using Juzo stockings on the left leg although she complains about difficulty in applying them. She has external compression pumps at home 02/28/16; the small open wound on her right lateral calf is improved in terms of wound area. It appears that she has a wrap injury on the anterior aspect of the upper leg 03/06/16; the small open wound on her right lateral calf has a very small open area remaining. The wrap injury on the anterior aspect of the upper leg also appears better. She arrives today in clinic with a history of dyspnea with minimal exertion starting with the last 2-4 days. She does not describe chest pain.  Her son and our intake nurse think she has facial swelling. She has a history of an artificial mitral valve on Coumadin 03/13/16; the small wound on the right lateral calf is no better this week. The superior wrap injury anteriorly requires debridement of surface eschar and nonviable subcutaneous tissue. She has been to her primary doctor with regards to her dyspnea we identified last week. Per the patient's description her Lasix has been increased 03/21/2016 -- patient of Dr. Dellia Nims who could not keep her appointment yesterday but has come in today with the right lower leg looking good and this is the leg which has been treated in the recent past. However, her left lower extremity is extremely swollen, tender and edematous with redness and discoloration. 03/27/16; there is only 2 small areas remain on the right leg. The edema that was so concerning last week has come down however there is extensive bruising on the lateral left leg medial left foot suggesting that she lost some blood into the leg itself. I checked her hemoglobin on 9/21 was 14.5. Her INR  was over 6. Duplex ultrasound was negative for DVT 04/03/16 at this point in time today patient is actually doing substantially better in regard to the wound on the anterior right lower extremity. currently there is no slough or eschar noted and no evidence of erythema, discharge, or local infection. She is exhibiting no signs of systemic infection. She is tolerating the dressing changes as well as the wrapping at this point in time. 04/24/16; I have not seen Mrs. Cranmore for 3 weeks. Apparently she was admitted to hospital for respiratory KAHLIYAH, DICK. ([id number]) issues/also apparently has had multiple compression fractures and had kyphoplasty. When she was last here she only had one small open area remaining on the left leg she was using juxtalite stockings. Her son is upset about restarting the Coumadin which she blames or the swelling  in her legs. She is on Coumadin for prophylaxis with a chronic artificial heart valve. 04/30/16 at this point in time patient has been tolerating the 3 layer compression wrap as well as the calcium alginate. 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 With tthat being said she does have the continued wound of the left lower extremity as well as the right medial lower extremity. The right side is significantly smaller compared to the left but actually is slough covered. fortunately she has no significant tenderness at rest although with manipulation of the right location of the wound this is significantly tender. 05/07/16 for follow-up evaluation today both the patient's wounds appear to be significantly improved in size compared to last week. She is also having less pain at both locations currently. Her pain level at most is related to be a 1-2 out of 10. She does have some discharge but fortunately no evidence of infection at this point.she also continues to tolerate the compression wraps well. 05/14/16; the patient's wound on the left leg actually looks as though it's on its way to closure. She is using collagen to this area. She has a small punched out area over the right medial calf. The cause of this is not really clear it has probably 0.4 cm of depth. She has an IV in her right hand which she states is for IV fluid when she develops low sodiums her potassiums, the etiology of this is not clear 05-21-16 she presents today with continued ulcerations the right lower extremity, posterior aspect. she has been using Iodoflex and compression therapy to the area. She denies any new injuries or trauma. She does have 2 areas proximal to this ulceration of dry crust along with an area to her left posterior lower leg that is purple discolored area appears similar to how her current ulceration started, she denies any trauma or injury to the left posterior leg we will monitor all of these  areas. 05/28/16; the patient is down to 1 small open area on the right medial mid calf. This is however larger and in 50% of the surface area deeper approximately 0.4 cm. The reason for this deterioration is unclear. I've gone ahead and done a culture of this area she also tells [place] today that she is short of breath. She is also noted a swelling on her lower left eyelid Objective Constitutional Sitting or standing Blood Pressure is within target range for patient.. Pulse regular and within target range for patient.Marland Kitchen Respirations regular, non-labored and within target range.. Temperature is normal and within the target range for the patient.. Patient's appearance is neat and clean. Appears in no acute distress. Well  nourished and well developed.. Vitals Time Taken: 12:56 PM, Height: 68 in, Weight: 178 lbs, BMI: 27.1, Temperature: 97.7 F, Pulse: 71 bpm, Respiratory Rate: 16 breaths/min, Blood Pressure: 134/66 mmHg. Eyes Hordeolum on the lower left eyelid. Otherwise eyes are normal. Respiratory Bassford, Jailyn C. ([id number]) Respiratory effort is easy and symmetric bilaterally. Rate is normal at rest and on room air.. Bilateral breath sounds are clear and equal in all lobes with no wheezes, rales or rhonchi.. Cardiovascular Mechanical first sound no murmurs JVP is not elevated. Pedal pulses palpable and strong bilaterally.. Gastrointestinal (GI) Abdomen is soft and non-distended without masses or tenderness. Bowel sounds active in all quadrants.. Lymphatic Done palpable in the popliteal or inguinal area. Psychiatric Patient appears depressed today.. General Notes: Wound exam; the patient has a small open area on the medial right calf mid aspect. There are no open wounds on the left she has an Ace wrap on this area. The small open wound on the right has about a 50% deep opening going down 0.4 cm. This is apparently worse from last week. Because of this I have done a culture of the wound  area. Debridement was done with a #3 curette to remove surface slough and nonviable subcutaneous tissue Integumentary (Hair, Skin) Wound #8 status is Open. Original cause of wound was Trauma. The wound is located on the Right,Medial Lower Leg. The wound measures 0.7cm length x 1.3cm width x 0.2cm depth; 0.715cm^2 area and 0.143cm^3 volume. The wound is limited to skin breakdown. There is no tunneling or undermining noted. There is a large amount of serosanguineous drainage noted. The wound margin is distinct with the outline attached to the wound base. There is no granulation within the wound bed. There is a large (67-100%) amount of necrotic tissue within the wound bed including Eschar and Adherent Slough. The periwound skin appearance exhibited: Localized Edema, Moist, Erythema. The surrounding wound skin color is noted with erythema which is circumferential. Periwound temperature was noted as No Abnormality. The periwound has tenderness on palpation. Assessment Active Problems ICD-10 L97.812 - Non-pressure chronic ulcer of other part of right lower leg with fat layer exposed I87.333 - Chronic venous hypertension (idiopathic) with ulcer and inflammation of bilateral lower extremity I89.0 - Lymphedema, not elsewhere classified Q30.09 - Chronic systolic (congestive) heart failure Brashears, Damani C. ([id number]) Procedures Wound #8 Wound #8 is a Trauma, Other located on the Right,Medial Lower Leg . There was a Skin/Subcutaneous Tissue Debridement ([id number]) debridement with total area of 0.91 sq cm performed by [name], MD. with the following instrument(s): Curette to remove Viable and Non-Viable tissue/material including Exudate, Fibrin/Slough, and Subcutaneous after achieving pain control using Lidocaine 4% Topical Solution. 1 Specimen was taken by a Swab and sent to the lab per facility protocol.A time out was conducted at 13:27, prior to the start of the procedure. A  Minimum amount of bleeding was controlled with Pressure. The procedure was tolerated well with a pain level of 0 throughout and a pain level of 0 following the procedure. Post Debridement Measurements: 0.7cm length x 1.3cm width x 0.3cm depth; 0.214cm^3 volume. Character of Wound/Ulcer Post Debridement requires further debridement. Severity of Tissue Post Debridement is: Fat layer exposed. Post procedure Diagnosis Wound #8: Same as Pre-Procedure Plan Wound Cleansing: Wound #8 Right,Medial Lower Leg: Clean wound with Normal Saline. Cleanse wound with mild soap and water No tub bath. - give sink or bed baths Anesthetic: Wound #8 Right,Medial Lower Leg: Topical Lidocaine 4% cream applied to wound bed  prior to debridement - for clinic use Skin Barriers/Peri-Wound Care: Wound #8 Right,Medial Lower Leg: Moisturizing lotion Primary Wound Dressing: Wound #8 Right,Medial Lower Leg: Aquacel - NO SILVER Secondary Dressing: Wound #8 Right,Medial Lower Leg: ABD pad Dry Gauze Dressing Change Frequency: Wound #8 Right,Medial Lower Leg: Change dressing every week Follow-up Appointments: Wound #8 Right,Medial Lower Leg: Return Appointment in 1 week. Edema Control: Wound #8 Right,Medial Lower Leg: Mckeough, Daveah C. ([id number]) Kerlix and Coban - Right Lower Extremity Additional Orders / Instructions: Wound #8 Right,Medial Lower Leg: Increase protein intake. Other: - Left leg kerlix and ace wrap change on Tuesdays and Fridays by RN until pt get her own compression stockings/wraps. LLE- Cleanse with soap and water, dry and moisturize, wrap with kerlix and ace wrap. Laboratory ordered were: Wound culture routine - right medial lower leg...done in clinic I changed the primary dressing to silver collagen to the right leg not realizing she was allergic to silver. We called the nursing home to change to regular collagen however all they had was plane aquacel. - adivsed warm compresses to the  the left ey qid -advised a chest xray due to SOB Electronic Signature(s) Signed: 05/28/2016 6:11:07 PM By: [name] MD Entered By: [name] on 05/28/2016 15:05:24 Lorensen, [name] ([id number]) -------------------------------------------------------------------------------- SuperBill Details Patient Name: [name] Date of Service: 05/28/2016 Medical Record Patient Account Number: [id number] Number: Treating RN: [name] 03/13/1935 (80 y.o. Other Clinician: Date of Birth/Sex: Female) Treating Martyn Timme Primary Care Physician/Extender: Claudette Laws Physician: Suella Grove in Treatment: 26 Referring Physician: [name] Diagnosis Coding ICD-10 Codes Code Description [phone] Non-pressure chronic ulcer of other part of right lower leg with fat layer exposed Chronic venous hypertension (idiopathic) with ulcer and inflammation of bilateral lower I87.333 extremity I89.0 Lymphedema, not elsewhere classified D35.70 Chronic systolic (congestive) heart failure Facility Procedures CPT4: Description Modifier Quantity Code [id number] 11042 - DEB SUBQ TISSUE 20 SQ CM/< 1 ICD-10 Description Diagnosis L97.812 Non-pressure chronic ulcer of other part of right lower leg with fat layer exposed I87.333 Chronic venous hypertension  (idiopathic) with ulcer and inflammation of bilateral lower extremity Physician Procedures CPT4: Description Modifier Quantity Code [id number] - WC PHYS SUBQ TISS 20 SQ CM 1 ICD-10 Description Diagnosis L97.812 Non-pressure chronic ulcer of other part of right lower leg with fat layer exposed I87.333 Chronic venous hypertension (idiopathic)  with ulcer and inflammation of bilateral lower extremity Electronic Signature(s) Signed: 05/28/2016 6:11:07 PM By: [name] MD [name] ([id number]) Entered By: [name] on 05/28/2016 15:06:38

## 2016-05-30 LAB — PROTIME-INR
INR: 1.52
PROTHROMBIN TIME: 18.5 s — AB (ref 11.4–15.2)

## 2016-05-30 LAB — CBC
HEMATOCRIT: 37.5 % (ref 35.0–47.0)
Hemoglobin: 12.2 g/dL (ref 12.0–16.0)
MCH: 27.6 pg (ref 26.0–34.0)
MCHC: 32.6 g/dL (ref 32.0–36.0)
MCV: 84.6 fL (ref 80.0–100.0)
Platelets: 202 10*3/uL (ref 150–440)
RBC: 4.43 MIL/uL (ref 3.80–5.20)
RDW: 19.1 % — ABNORMAL HIGH (ref 11.5–14.5)
WBC: 2.9 10*3/uL — AB (ref 3.6–11.0)

## 2016-05-30 LAB — BASIC METABOLIC PANEL
ANION GAP: 8 (ref 5–15)
BUN: 22 mg/dL — ABNORMAL HIGH (ref 6–20)
CALCIUM: 9.8 mg/dL (ref 8.9–10.3)
CO2: 28 mmol/L (ref 22–32)
Chloride: 103 mmol/L (ref 101–111)
Creatinine, Ser: 1.39 mg/dL — ABNORMAL HIGH (ref 0.44–1.00)
GFR, EST AFRICAN AMERICAN: 41 mL/min — AB (ref >60)
GFR, EST NON AFRICAN AMERICAN: 35 mL/min — AB (ref >60)
GLUCOSE: 66 mg/dL (ref 65–99)
POTASSIUM: 4.2 mmol/L (ref 3.5–5.1)
SODIUM: 139 mmol/L (ref 135–145)

## 2016-05-30 NOTE — Progress Notes (Signed)
Location:      Place of Service:  SNF (31) Provider:  Toni Arthurs, NP-C  Rusty Aus, MD  Patient Care Team: Rusty Aus, MD as PCP - General (Internal Medicine) Robert Bellow, MD as Consulting Physician (General Surgery)  Extended Emergency Contact Information Primary Emergency Contact: [name],[name] Address: [address]          [place], [name] [address] [name] of [place] Phone: [phone] Relation: Daughter Secondary Emergency Contact: [name] Mobile Phone: [phone] Relation: Son  Code Status:  DO NOT RESUSCITATE Goals of care: Advanced Directive information Advanced Directives 04/10/2016  Does Patient Have a Medical Advance Directive? No  Would patient like information on creating a medical advance directive? No - patient declined information     Chief Complaint  Patient presents with  . Follow-up    HPI:  Pt is a 81 y.o. female seen today for an acute visit for therapeutic monitoring of INR. Patient's Coumadin was held over the weekend due to INR being very minimally elevated. INR goal is 2.5-3.5 due to mechanical valve replacement. Today, INR was 1.38.  Patient displays no signs of bleeding. Patient does have a resolving bruise on the back of her left hand, where she says she hit it against the toilet paper holder in the bathroom. Patient reports she is feeling okay. Updated patient on the INR results and for the plan of correction. Patient denies any complaints. Pt also educated on plan for CKD- encouraged po fluid intake and avoidance of nephrotoxic meds. Informed pt of ongoing monitoring of labs/ INR. Pt understanding and agreeable of plan. Pt reports at this time, her pain is well controlled. She is working well with therapy. Vital signs stable. No other complaints.    Past Medical History:  Diagnosis Date  . Arthritis    "back, hands, knees" (04/10/2016)  . Chronic atrial fibrillation ([place])    a. On  Coumadin  . Chronic back pain   . Chronic combined systolic (congestive) and diastolic (congestive) heart failure    a. EF 25% by echo in 08/2015 b. RHC in 08/2015 showed normal filling pressures  . Compression fracture    "several; all in my back" (04/10/2016)  . GERD (gastroesophageal reflux disease)   . Heart murmur   . Hypertension   . S/P MVR (mitral valve replacement)    a. MVR 1994 b. redo MVR in 08/2014 - on Coumadin  . Shortness of breath dyspnea    Past Surgical History:  Procedure Laterality Date  . BREAST LUMPECTOMY Right 2005?   benign lump excision  . CARDIAC CATHETERIZATION    . [place], 2016   "MVR; MVR"  . CATARACT EXTRACTION W/PHACO Right 02/20/2015   Procedure: CATARACT EXTRACTION PHACO AND INTRAOCULAR LENS PLACEMENT (IOC);  Surgeon: Estill Cotta, MD;  Location: [facility] ORS;  Service: Ophthalmology;  Laterality: Right;  US01:31.2 AP 25.3% CDE40.13 Fluid lot # [id number]  . ELECTROPHYSIOLOGIC STUDY N/A 10/09/2015   Procedure: CARDIOVERSION;  Surgeon: Wellington Hampshire, MD;  Location: [facility] ORS;  Service: Cardiovascular;  Laterality: N/A;  . IR GENERIC HISTORICAL  04/01/2016   IR RADIOLOGIST EVAL & MGMT 04/01/2016 [facility]  . IR GENERIC HISTORICAL  04/15/2016   IR VERTEBROPLASTY CERV/THOR BX INC UNI/BIL INC/INJECT/IMAGING 04/15/2016 Luanne Bras, MD [facility]  . IR GENERIC HISTORICAL  04/15/2016   IR VERTEBROPLASTY EA ADDL (T&LS) BX INC UNI/BIL INC INJECT/IMAGING 04/15/2016 Luanne Bras, MD [facility]  .  IR GENERIC HISTORICAL  04/15/2016   IR VERTEBROPLASTY EA ADDL (T&LS) BX INC UNI/BIL INC INJECT/IMAGING 04/15/2016 Luanne Bras, MD [facility]  . IR GENERIC HISTORICAL  05/20/2016   IR RADIOLOGIST EVAL & MGMT 05/20/2016 [facility]  . IRRIGATION AND DEBRIDEMENT HEMATOMA Left 07/05/2015   Procedure: IRRIGATION AND DEBRIDEMENT HEMATOMA;  Surgeon: Robert Bellow, MD;  Location: [facility] ORS;  Service: General;   Laterality: Left;  . pyloric stenosis  07/1937  . US ECHOCARDIOGRAPHY      Allergies  Allergen Reactions  . Ace Inhibitors     Other reaction(s): Cough  . Hydrocodone Itching and Nausea Only  . Mercury     Other reaction(s): Unknown  . Nickel   . Silver Dermatitis    Severe itching  . Cephalexin Rash  . Clindamycin/Lincomycin Rash  . Penicillins Rash    Has patient had a PCN reaction causing immediate rash, facial/tongue/throat swelling, SOB or lightheadedness with hypotension: YES Has patient had a PCN reaction causing severe rash involving mucus membranes or skin necrosis: NO Has patient had a PCN reaction that required hospitalization NO Has patient had a PCN reaction occurring within the last 10 years: NO If all of the above answers are "NO", then may proceed with Cephalosporin use.      Medication List       Accurate as of 05/21/16 11:59 PM. Always use your most recent med list.          acetaminophen 500 MG tablet Commonly known as:  TYLENOL Take 500 mg by mouth every 6 (six) hours as needed for mild pain, moderate pain, fever or headache.   aspirin EC 81 MG tablet Take 81 mg by mouth daily.   feeding supplement (ENSURE ENLIVE) Liqd Take 237 mLs by mouth 2 (two) times daily between meals.   furosemide 40 MG tablet Commonly known as:  LASIX Take 40 mg by mouth 2 (two) times daily.   liver oil-zinc oxide 40 % ointment Commonly known as:  DESITIN Apply 1 application topically 3 (three) times daily. Apply to bed sore   metoprolol succinate 25 MG 24 hr tablet Commonly known as:  TOPROL-XL Take 1 tablet (25 mg total) by mouth daily.   oxyCODONE 5 MG immediate release tablet Commonly known as:  Oxy IR/ROXICODONE Take 1 tablet (5 mg total) by mouth every 4 (four) hours.   pantoprazole 40 MG tablet Commonly known as:  PROTONIX Take 40 mg by mouth daily.   polyethylene glycol packet Commonly known as:  MIRALAX / GLYCOLAX Take 17 g by mouth 2 (two) times  daily.   senna-docusate 8.6-50 MG tablet Commonly known as:  Senokot-S Take 1 tablet by mouth 2 (two) times daily.   spironolactone 25 MG tablet Commonly known as:  ALDACTONE Take 25 mg by mouth 2 (two) times daily.   warfarin 5 MG tablet Commonly known as:  COUMADIN Take 1 tablet (5 mg total) by mouth daily.       Review of Systems  Constitutional: Negative for activity change, appetite change, chills, diaphoresis and fever.  Respiratory: Negative for apnea, cough, choking, chest tightness, shortness of breath and wheezing.   Cardiovascular: Negative for chest pain, palpitations and leg swelling.  Gastrointestinal: Negative for abdominal distention, abdominal pain, anal bleeding, blood in stool, constipation, diarrhea and nausea.  Genitourinary: Negative for difficulty urinating, dysuria, frequency, hematuria, urgency and vaginal bleeding.  Musculoskeletal: Positive for arthralgias (typical arthritis), back pain and myalgias. Negative for gait problem.  Skin: Negative for color  change, pallor, rash and wound.  Neurological: Negative for dizziness, tremors, syncope, speech difficulty, weakness, numbness and headaches.  All other systems reviewed and are negative.    There is no immunization history on file for this patient. Pertinent  Health Maintenance Due  Topic Date Due  . DEXA SCAN  06/28/2002  . PNA vac Low Risk Adult (1 of 2 - PCV13) 06/28/2002  . INFLUENZA VACCINE  01/30/2016   Fall Risk  09/25/2015  Falls in the past year? No   Functional Status Survey:    Vitals:   05/21/16 1220  BP: 117/64  Pulse: 73  Resp: 18  Temp: 97.7 F (36.5 C)  SpO2: 95%  Weight: 142 lb 1.6 oz (64.5 kg)   Body mass index is 22.94 kg/m. Physical Exam  Constitutional: She is oriented to person, place, and time. Vital signs are normal. She appears well-developed and well-nourished. She is active and cooperative. She does not appear ill. No distress.  HENT:  Head: Normocephalic  and atraumatic.  Mouth/Throat: Uvula is midline, oropharynx is clear and moist and mucous membranes are normal. Mucous membranes are not pale, not dry and not cyanotic.  Eyes: Conjunctivae, EOM and lids are normal. Pupils are equal, round, and reactive to light.  Neck: Trachea normal, normal range of motion and full passive range of motion without pain. Neck supple. No JVD present. No tracheal deviation, no edema and no erythema present. No thyromegaly present.  Cardiovascular: Normal rate, regular rhythm, normal heart sounds, intact distal pulses and normal pulses.  Exam reveals no gallop, no distant heart sounds and no friction rub.   No murmur heard. Pulmonary/Chest: Effort normal and breath sounds normal. No accessory muscle usage. No respiratory distress. She has no wheezes. She has no rales. She exhibits no tenderness.  Abdominal: Normal appearance and bowel sounds are normal. She exhibits no distension and no ascites. There is no tenderness.  Musculoskeletal: Normal range of motion. She exhibits no edema.       Thoracic back: She exhibits tenderness.  Expected osteoarthritis, stiffness  Neurological: She is alert and oriented to person, place, and time. She has normal strength. No cranial nerve deficit or sensory deficit.  Skin: Skin is warm, dry and intact. No rash noted. She is not diaphoretic. No cyanosis or erythema. No pallor. Nails show no clubbing.  Psychiatric: She has a normal mood and affect. Her speech is normal and behavior is normal. Judgment and thought content normal. Cognition and memory are normal.  Nursing note and vitals reviewed.   Labs reviewed:  Recent Labs  05/24/16 0740 05/26/16 0739 05/30/16 0730  NA 136 138 139  K 4.5 4.8 4.2  CL 100* 102 103  CO2 29 28 28   GLUCOSE 91 83 66  BUN 39* 30* 22*  CREATININE 1.55* 1.40* 1.39*  CALCIUM 9.6 9.5 9.8    Recent Labs  05/17/16 0650 05/20/16 0435 05/21/16 1625  AST 24 24 25   ALT 18 18 19   ALKPHOS 150* 122  151*  BILITOT 1.1 0.9 0.6  PROT 7.3 7.0 7.5  ALBUMIN 4.2 3.9 4.3    Recent Labs  05/22/16 0650 05/24/16 0740 05/26/16 0739 05/30/16 0730  WBC 4.3 4.2 4.0 2.9*  NEUTROABS 2.8 2.7 2.6  --   HGB 12.7 12.3 12.4 12.2  HCT 38.7 37.3 37.9 37.5  MCV 83.9 84.5 85.7 84.6  PLT 163 159 181 202   Lab Results  Component Value Date   TSH 2.239 04/13/2016   No results found  for: HGBA1C No results found for: CHOL, HDL, LDLCALC, LDLDIRECT, TRIG, CHOLHDL  Significant Diagnostic Results in last 30 days:  Ir Radiologist Eval & Mgmt  Result Date: 05/21/2016 EXAM: ESTABLISHED PATIENT OFFICE VISIT CHIEF COMPLAINT: The patient is a 81 year old right-handed lady who presents accompanied by her daughter in follow-up to a vertebral body augmentation at T6-T7 and L5 approximately 2 weeks ago. According to the patient and also her daughter, the main improvement has been noted since the procedures has been improved mobility with a walker with less pain on standing. Otherwise, the patient reports no significant change in her pain overall. She continues to have low back pain which is almost constant and radiates into the iliac regions. The pain does not radiate in a radicular manner into the lower extremities. She has no autonomic dysfunction of her bowel or bladder activity. With the oxycodone, her pain is controlled to a level 5 out of 10 in terms of intensity. Since her visitation the last time the patient reports significantly improved appetite. She completes 3 meals a day. Her weight has been steady and slightly increased to 137 pounds. She used to be at least 158 pounds prior to the development of her fractures. She denies any recent chills, fever or rigors. She denies any UTI symptoms of dysuria, polyuria or of hematuria. She denies recent chest pain or shortness of breath. Recently she had a traumatic hematoma in one of her feet which was evacuated and is in the process of healing. Current Pain Level: 1-10  HISTORY OF PRESENT ILLNESS: As above. Past medical history, family history and social history as per previously unchanged. She is still at [facility] where she undergoes ambulation. Present medications: Acetaminophen, aspirin, Ensure, furosemide, liver oil-zinc oxide, metoprolol, oxycodone every 6 hours prn, Protonix, MiraLax, Senokot, spironolactone, and warfarin. Allergies: Ace inhibitors, hydrocodone, mercury, nickel, silver, cephalexin, clindamycin, lincomycin and penicillins. PHYSICAL EXAMINATION: In mild to moderate distress on account of her pain. Patient sitting in a wheelchair slouched forward. Neurologically intact completely. The patient demonstrating no evidence of tenderness in the thoracic and lower thoracic regions. However, moderately severely tender in the lower lumbar regions at L2-L4. ASSESSMENT AND PLAN: Previous MRI scan of the thoracolumbar spine was reviewed with her and her daughter. The L2-L4 levels had areas of signal abnormality suggestive of subacute compression fractures. Also noted was abnormal signal in the sacral ala more prominent on the right-side. The option of performing vertebral body augmentation at L2, L4 and the right-side of the sacrum was again discussed with the patient and the patient's daughter. The patient wants to see how much more of her physical activity that she can tolerate with the present pain and pain medications. To this affect, it was decided to continue with the conservative management with gradually improving her level of activity as tolerated in rehab. Should her pain continued as it is right now or worsen, the patient and her daughter were instructed to call to set up vertebral body augmentations at L2, L4 and S1. Questions were answered to their satisfaction. Both the daughter and the patient leave with good understanding and agreement with the above management plan. Electronically Signed   By: Luanne Bras M.D.   On: 05/20/2016 14:13      Assessment/Plan 1. Encounter for therapeutic drug monitoring  Coumadin 5 mg PO Q Day at 1700 for Atrial Fibrillation  Recheck INR 2 days  Monitor for s/s of bleeding, bruising, hematuria, hematemesis, melena  Check INR 3 days  after initiation of antibiotic, when applicable.  2. CKD, stage III  Improving  Continue to encourage po fluids  Continue to avoid nephrotoxic meds  Family/ staff Communication:   Total Time:  Documentation:  Face to Face:  Family/Phone:   Labs/tests ordered:  CBC, met C, PT/INR  Medication list reviewed and assessed for continued appropriateness.  Vikki Ports, NP-C [facility] [phone], [place] [address] Cell Phone (Mon-Fri 8am-5pm):  [phone] On Call:  [phone] & follow prompts after 5pm & weekends Office Phone:  [phone] Office Fax:  [phone]

## 2016-05-31 ENCOUNTER — Encounter
Admission: RE | Admit: 2016-05-31 | Discharge: 2016-05-31 | Disposition: A | Discharge status: Home / Self Care | Payer: Medicare Other | Source: Ambulatory Visit | Attending: Internal Medicine | Admitting: Internal Medicine

## 2016-05-31 DIAGNOSIS — N189 Chronic kidney disease, unspecified: Secondary | ICD-10-CM | POA: Insufficient documentation

## 2016-05-31 DIAGNOSIS — I4891 Unspecified atrial fibrillation: Secondary | ICD-10-CM | POA: Insufficient documentation

## 2016-05-31 LAB — AEROBIC CULTURE  (SUPERFICIAL SPECIMEN)

## 2016-05-31 LAB — AEROBIC CULTURE W GRAM STAIN (SUPERFICIAL SPECIMEN): Culture: NO GROWTH

## 2016-06-01 LAB — COMPREHENSIVE METABOLIC PANEL
ALBUMIN: 3.5 g/dL (ref 3.5–5.0)
ALT: 16 U/L (ref 14–54)
ANION GAP: 8 (ref 5–15)
AST: 18 U/L (ref 15–41)
Alkaline Phosphatase: 129 U/L — ABNORMAL HIGH (ref 38–126)
BUN: 17 mg/dL (ref 6–20)
CHLORIDE: 102 mmol/L (ref 101–111)
CO2: 26 mmol/L (ref 22–32)
Calcium: 9.8 mg/dL (ref 8.9–10.3)
Creatinine, Ser: 1.06 mg/dL — ABNORMAL HIGH (ref 0.44–1.00)
GFR calc Af Amer: 57 mL/min — ABNORMAL LOW (ref >60)
GFR calc non Af Amer: 49 mL/min — ABNORMAL LOW (ref >60)
GLUCOSE: 90 mg/dL (ref 65–99)
POTASSIUM: 4 mmol/L (ref 3.5–5.1)
SODIUM: 136 mmol/L (ref 135–145)
Total Bilirubin: 1 mg/dL (ref 0.3–1.2)
Total Protein: 6.7 g/dL (ref 6.5–8.1)

## 2016-06-01 LAB — CBC
HCT: 37.4 % (ref 35.0–47.0)
HEMOGLOBIN: 12.4 g/dL (ref 12.0–16.0)
MCH: 28.2 pg (ref 26.0–34.0)
MCHC: 33.2 g/dL (ref 32.0–36.0)
MCV: 84.9 fL (ref 80.0–100.0)
PLATELETS: 213 10*3/uL (ref 150–440)
RBC: 4.4 MIL/uL (ref 3.80–5.20)
RDW: 18.1 % — ABNORMAL HIGH (ref 11.5–14.5)
WBC: 3.9 10*3/uL (ref 3.6–11.0)

## 2016-06-01 LAB — PROTIME-INR
INR: 1.67
PROTHROMBIN TIME: 19.9 s — AB (ref 11.4–15.2)

## 2016-06-03 LAB — CBC WITH DIFFERENTIAL/PLATELET
Basophils Absolute: 0.1 10*3/uL (ref 0–0.1)
Basophils Relative: 2 %
EOS ABS: 0.1 10*3/uL (ref 0–0.7)
Eosinophils Relative: 3 %
HCT: 38.5 % (ref 35.0–47.0)
HEMOGLOBIN: 12.7 g/dL (ref 12.0–16.0)
LYMPHS ABS: 0.8 10*3/uL — AB (ref 1.0–3.6)
LYMPHS PCT: 22 %
MCH: 28.3 pg (ref 26.0–34.0)
MCHC: 32.9 g/dL (ref 32.0–36.0)
MCV: 85.8 fL (ref 80.0–100.0)
Monocytes Absolute: 0.5 10*3/uL (ref 0.2–0.9)
Monocytes Relative: 15 %
NEUTROS PCT: 58 %
Neutro Abs: 2.2 10*3/uL (ref 1.4–6.5)
Platelets: 222 10*3/uL (ref 150–440)
RBC: 4.49 MIL/uL (ref 3.80–5.20)
RDW: 18.6 % — ABNORMAL HIGH (ref 11.5–14.5)
WBC: 3.7 10*3/uL (ref 3.6–11.0)

## 2016-06-03 LAB — PROTIME-INR
INR: 2.42
Prothrombin Time: 26.8 seconds — ABNORMAL HIGH (ref 11.4–15.2)

## 2016-06-03 LAB — BASIC METABOLIC PANEL
Anion gap: 8 (ref 5–15)
BUN: 19 mg/dL (ref 6–20)
CALCIUM: 9.6 mg/dL (ref 8.9–10.3)
CO2: 28 mmol/L (ref 22–32)
CREATININE: 1.3 mg/dL — AB (ref 0.44–1.00)
Chloride: 100 mmol/L — ABNORMAL LOW (ref 101–111)
GFR calc Af Amer: 44 mL/min — ABNORMAL LOW (ref >60)
GFR, EST NON AFRICAN AMERICAN: 38 mL/min — AB (ref >60)
GLUCOSE: 78 mg/dL (ref 65–99)
Potassium: 4.4 mmol/L (ref 3.5–5.1)
SODIUM: 136 mmol/L (ref 135–145)

## 2016-06-05 ENCOUNTER — Telehealth (HOSPITAL_COMMUNITY): Payer: Self-pay | Admitting: Radiology

## 2016-06-05 ENCOUNTER — Encounter: Payer: Medicare Other | Attending: Internal Medicine | Admitting: Internal Medicine

## 2016-06-05 DIAGNOSIS — I87333 Chronic venous hypertension (idiopathic) with ulcer and inflammation of bilateral lower extremity: Secondary | ICD-10-CM | POA: Insufficient documentation

## 2016-06-05 DIAGNOSIS — L97812 Non-pressure chronic ulcer of other part of right lower leg with fat layer exposed: Secondary | ICD-10-CM | POA: Insufficient documentation

## 2016-06-05 DIAGNOSIS — Z79899 Other long term (current) drug therapy: Secondary | ICD-10-CM | POA: Insufficient documentation

## 2016-06-05 DIAGNOSIS — I4891 Unspecified atrial fibrillation: Secondary | ICD-10-CM | POA: Insufficient documentation

## 2016-06-05 DIAGNOSIS — Z952 Presence of prosthetic heart valve: Secondary | ICD-10-CM | POA: Insufficient documentation

## 2016-06-05 DIAGNOSIS — Z7901 Long term (current) use of anticoagulants: Secondary | ICD-10-CM | POA: Insufficient documentation

## 2016-06-05 DIAGNOSIS — I5022 Chronic systolic (congestive) heart failure: Secondary | ICD-10-CM | POA: Insufficient documentation

## 2016-06-05 DIAGNOSIS — I890 Lymphedema, not elsewhere classified: Secondary | ICD-10-CM | POA: Insufficient documentation

## 2016-06-05 LAB — CBC WITH DIFFERENTIAL/PLATELET
Basophils Absolute: 0.1 10*3/uL (ref 0–0.1)
Basophils Relative: 2 %
EOS ABS: 0.1 10*3/uL (ref 0–0.7)
EOS PCT: 4 %
HCT: 37.3 % (ref 35.0–47.0)
Hemoglobin: 12.3 g/dL (ref 12.0–16.0)
LYMPHS ABS: 0.8 10*3/uL — AB (ref 1.0–3.6)
LYMPHS PCT: 25 %
MCH: 27.9 pg (ref 26.0–34.0)
MCHC: 32.9 g/dL (ref 32.0–36.0)
MCV: 84.6 fL (ref 80.0–100.0)
MONO ABS: 0.6 10*3/uL (ref 0.2–0.9)
MONOS PCT: 19 %
Neutro Abs: 1.6 10*3/uL (ref 1.4–6.5)
Neutrophils Relative %: 50 %
PLATELETS: 233 10*3/uL (ref 150–440)
RBC: 4.4 MIL/uL (ref 3.80–5.20)
RDW: 18.6 % — AB (ref 11.5–14.5)
WBC: 3.2 10*3/uL — ABNORMAL LOW (ref 3.6–11.0)

## 2016-06-05 LAB — BASIC METABOLIC PANEL
Anion gap: 8 (ref 5–15)
BUN: 22 mg/dL — ABNORMAL HIGH (ref 6–20)
CHLORIDE: 100 mmol/L — AB (ref 101–111)
CO2: 29 mmol/L (ref 22–32)
CREATININE: 1.36 mg/dL — AB (ref 0.44–1.00)
Calcium: 9.6 mg/dL (ref 8.9–10.3)
GFR calc non Af Amer: 36 mL/min — ABNORMAL LOW (ref >60)
GFR, EST AFRICAN AMERICAN: 42 mL/min — AB (ref >60)
GLUCOSE: 69 mg/dL (ref 65–99)
Potassium: 4.2 mmol/L (ref 3.5–5.1)
Sodium: 137 mmol/L (ref 135–145)

## 2016-06-05 LAB — PROTIME-INR
INR: 2.5
PROTHROMBIN TIME: 27.5 s — AB (ref 11.4–15.2)

## 2016-06-06 ENCOUNTER — Other Ambulatory Visit (HOSPITAL_COMMUNITY): Payer: Self-pay | Admitting: Interventional Radiology

## 2016-06-06 DIAGNOSIS — S32000A Wedge compression fracture of unspecified lumbar vertebra, initial encounter for closed fracture: Secondary | ICD-10-CM

## 2016-06-06 NOTE — Progress Notes (Signed)
[name], [name] ([id number]) Visit Report for 06/05/2016 Chief Complaint Document Details [name], [name] Date of Service: 06/05/2016 1:30 PM Patient Name: C. Patient Account Number: [id number] Medical Record Treating RN: [name] [id number] Number: Other Clinician: 03/10/1935 (81 y.o. Treating Maxtyn Nuzum, [place] Date of Birth/Sex: Female) Physician/Extender: G Primary Care [name] Physician: Referring Physician: [name] in Treatment: 28 Information Obtained from: Patient Chief Complaint Patient here for reevaluation of her right lower externally ulcer Electronic Signature(s) Signed: 06/05/2016 4:39:43 PM By: [name] MD Entered By: [name] on 06/05/2016 14:41:03 Appling, [name] ([id number]) -------------------------------------------------------------------------------- HPI Details [name] Date of Service: 06/05/2016 1:30 PM Patient Name: C. Patient Account Number: [id number] Medical Record Treating RN: [name] [id number] Number: Other Clinician: 1934-11-27 (81 y.o. Treating Tony Granquist, [place] Date of Birth/Sex: Female) Physician/Extender: G [place] Physician: Referring Physician: [name] in Treatment: 28 History of Present Illness HPI Description: 11/22/15; this is [place] is a 81 year old woman who lives at home on her own. According to the patient and her daughter was present she has had long-standing edema in her legs dating back many years. She also has a history of chronic systolic heart failure, atrial fibrillation and is status post mitral valve replacement. Last echocardiogram I see [facility] showed an ejection fraction of 40-45% she is on Lasix 60 mg a day and spironolactone 25 mg a day. Her current problem began in December around Christmas time she developed a small hematoma in the medial part of her left leg which rapidly expanded to a very large hematoma that  required surgical debridement. This situation was complicated by the fact that the patient is on long-standing Coumadin for mechanical heart valve. She went to the OR had this evacuated on January 4 /17. The wound has gradually improved however she has developed a small wounds around this area and more recently a wound on the right lateral leg. She is weeping edema fluid. The patient is already been to see vascular surgery. It was recommended that she wear Unna boots, she did not tolerate this due to pain in the left leg. She was then prescribed Juzo stockings and really can't get these on herself although truthfully there is probably too much edema for a Juzo stockings currently. She is not a diabetic and has no history of PAD or claudication that I could elicit. She does not use the external compression pumps reliably. She comes today with notes from her primary physician and Dr. Ronalee Belts both recommending various forms of compression but the patient does not really complied with them. Has been using the external compression pumps with some regularity but certainly not daily on the right leg and this has helped. I also note that her daughter tells me the history that she does not sleep in bed at home. She has a hospital bed but with her legs up she finds this painful so she sleeps in the couch sitting up with her legs dependent. 11/29/15; the patient is arrives today accompanied by her son. He expresses satisfaction that she is maintained the compression all week. 12/06/15; the patient has kept her Profore light wraps on, we have good edema control no major change in the wounds we have been using Aquacel 12/13/15; changed to RTD last week. One of the 3 wounds on her left medial leg is healed she has 2 remaining wounds here and one on the right lateral leg. 12/18/2015 -- the patient was at Dr. Nino Parsley office today and he  was seeing her for an arterial study. While the wrap was being removed she  had an inadvertent laceration of the left proximal anterior leg which bled quite profusely and a compression dressing was applied over this and the patient was here to get her Profore wraps done. I was asked to see the patient to make an assessment and treat appropriately. 12/26/15; the patient's injury on the left proximal leg and Steri-Strips removed after soaking. There is an open area here. The original wounds to still open on the right lateral and left medial leg. 01/03/16 patient's injury on her proximal left leg looks quite good. Still small open area on the medial left leg which appears to be improving. The area on the right lateral leg still substantially open with no real improvement in wound depth. Her edema control is marginal with a Profore light. We have been using RTD Carlin, Zoey C. ([id number]) for 3-4 weeks without any major change 01/10/16: wounds without s/s of infection. vascular results are pending regarding arterial studies. 01/17/16; patient comes in today complaining of severe pain however I think most of this is in the right hip not related to her wounds. She continues with a oval-shaped wound on the right lateral leg, trauma to the left anterior leg just below her tibial plateau. She has a smaller eschar on the left anterior leg. She is being using Prisma however she informs [place] today that she is actually allergic to silver, nose this from a previous application at [facility] some years ago 01/24/16; edema is not so well controlled today, I think I reduced her to [place] bilaterally last week. The area which was a scissor injury on her left upper anterior lower leg is fully epithelialized. She only has a small open area remaining on the medial aspect of her left leg. The oval-shaped wound on the right mid lateral leg may be a bit smaller. Debrided of surface slough nonviable subcutaneous tissue. I had changed to collagen 2 weeks ago in an attempt to get this to  close 01/31/16 all the patient's wounds on her left leg give healed. We have good edema control with bilateral Profore lites which she has been compliant with. She still has the oval-shaped wound on the right lateral calf allergic even this appears to be slowly improving. The patient has juxtalite stockings at home. She states she thinks she can put these on. She also has external compression pumps at home although I think her compliance with this has not been good in the past. She complains today of edema in her thighs. Tells me she takes 40 mg of Lasix daily 02/21/16; only 1 small wound remains on the lateral aspect of the right calf. She is using Juzo stockings on the left leg although she complains about difficulty in applying them. She has external compression pumps at home 02/28/16; the small open wound on her right lateral calf is improved in terms of wound area. It appears that she has a wrap injury on the anterior aspect of the upper leg 03/06/16; the small open wound on her right lateral calf has a very small open area remaining. The wrap injury on the anterior aspect of the upper leg also appears better. She arrives today in clinic with a history of dyspnea with minimal exertion starting with the last 2-4 days. She does not describe chest pain. Her son and our intake nurse think she has facial swelling. She has a history of an artificial mitral valve on Coumadin  03/13/16; the small wound on the right lateral calf is no better this week. The superior wrap injury anteriorly requires debridement of surface eschar and nonviable subcutaneous tissue. She has been to her primary doctor with regards to her dyspnea we identified last week. Per the patient's description her Lasix has been increased 03/21/2016 -- patient of Dr. Dellia Nims who could not keep her appointment yesterday but has come in today with the right lower leg looking good and this is the leg which has been treated in the recent past.  However, her left lower extremity is extremely swollen, tender and edematous with redness and discoloration. 03/27/16; there is only 2 small areas remain on the right leg. The edema that was so concerning last week has come down however there is extensive bruising on the lateral left leg medial left foot suggesting that she lost some blood into the leg itself. I checked her hemoglobin on 9/21 was 14.5. Her INR was over 6. Duplex ultrasound was negative for DVT 04/03/16 at this point in time today patient is actually doing substantially better in regard to the wound on the anterior right lower extremity. currently there is no slough or eschar noted and no evidence of erythema, discharge, or local infection. She is exhibiting no signs of systemic infection. She is tolerating the dressing changes as well as the wrapping at this point in time. 04/24/16; I have not seen Mrs. Halvorsen for 3 weeks. Apparently she was admitted to hospital for respiratory issues/also apparently has had multiple compression fractures and had kyphoplasty. When she was last here she only had one small open area remaining on the left leg she was using juxtalite stockings. Her son is upset about restarting the Coumadin which she blames or the swelling in her legs. She is on Coumadin for prophylaxis with a chronic artificial heart valve. DEVERY, ODWYER ([id number]) 04/30/16 at this point in time patient has been tolerating the 3 layer compression wrap as well as the calcium alginate. 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 With tthat being said she does have the continued wound of the left lower extremity as well as the right medial lower extremity. The right side is significantly smaller compared to the left but actually is slough covered. fortunately she has no significant tenderness at rest although with manipulation of the right location of the wound this is significantly tender. 05/07/16 for follow-up evaluation  today both the patient's wounds appear to be significantly improved in size compared to last week. She is also having less pain at both locations currently. Her pain level at most is related to be a 1-2 out of 10. She does have some discharge but fortunately no evidence of infection at this point.she also continues to tolerate the compression wraps well. 05/14/16; the patient's wound on the left leg actually looks as though it's on its way to closure. She is using collagen to this area. She has a small punched out area over the right medial calf. The cause of this is not really clear it has probably 0.4 cm of depth. She has an IV in her right hand which she states is for IV fluid when she develops low sodiums her potassiums, the etiology of this is not clear 05-21-16 she presents today with continued ulcerations the right lower extremity, posterior aspect. she has been using Iodoflex and compression therapy to the area. She denies any new injuries or trauma. She does have 2 areas proximal to this ulceration of dry crust  along with an area to her left posterior lower leg that is purple discolored area appears similar to how her current ulceration started, she denies any trauma or injury to the left posterior leg we will monitor all of these areas. 05/28/16; the patient is down to 1 small open area on the right medial mid calf. This is however larger and in 50% of the surface area deeper approximately 0.4 cm. The reason for this deterioration is unclear. I've gone ahead and done a culture of this area she also tells [place] today that she is short of breath. She is also noted a swelling on her lower left eyelid 06/05/16; the patient is 1 small open area on the right medial mid calf. This looks about the same as last week. The deep area, medial 50% of the wound looks about the same. CandS I did last week was negative. The entire area looks about the same Electronic Signature(s) Signed: 06/05/2016 4:39:43  PM By: [name] MD Entered By: [name] on 06/05/2016 14:42:22 Costello, [name] ([id number]) -------------------------------------------------------------------------------- Physical Exam Details [name] Date of Service: 06/05/2016 1:30 PM Patient Name: C. Patient Account Number: [id number] Medical Record Treating RN: [name] [id number] Number: Other Clinician: January 22, 1935 (81 y.o. Treating Curlie Sittner Date of Birth/Sex: Female) Physician/Extender: G Primary Care [name] Physician: Referring Physician: [name] in Treatment: 28 Constitutional Sitting or standing Blood Pressure is within target range for patient.. Pulse regular and within target range for patient.Marland Kitchen Respirations regular, non-labored and within target range.. Temperature is normal and within the target range for the patient.. Patient's appearance is neat and clean. Appears in no acute distress. Well nourished and well developed.. Notes Wound exam; the patient has a small open area on the right medial calf. Last week 50% of this had deteriorated with necrotic-looking material. The cause of this is not completely clear. CandS was negative. There is no evidence of surrounding infection Electronic Signature(s) Signed: 06/05/2016 4:39:43 PM By: [name] MD Entered By: [name] on 06/05/2016 14:43:36 Pfannenstiel, [name] ([id number]) -------------------------------------------------------------------------------- Physician Orders Details [name] Date of Service: 06/05/2016 1:30 PM Patient Name: C. Patient Account Number: [id number] Medical Record Treating RN: [name] [id number] Number: Other Clinician: Feb 01, 1935 (81 y.o. Treating Lidia Clavijo, [place] Date of Birth/Sex: Female) Physician/Extender: G Primary Care [name] Physician: Referring Physician: [name] in Treatment: 28 Verbal / Phone Orders: Yes Clinician:  Carolyne Fiscal, Debi Read Back and Verified: Yes Diagnosis Coding Wound Cleansing Wound #8 Right,Medial Lower Leg o Clean wound with Normal Saline. o Cleanse wound with mild soap and water o No tub bath. - give sink or bed baths Anesthetic Wound #8 Right,Medial Lower Leg o Topical Lidocaine 4% cream applied to wound bed prior to debridement - for clinic use Skin Barriers/Peri-Wound Care Wound #8 Right,Medial Lower Leg o Moisturizing lotion Primary Wound Dressing Wound #8 Right,Medial Lower Leg o Iodoflex Secondary Dressing Wound #8 Right,Medial Lower Leg o ABD pad o Dry Gauze Dressing Change Frequency Wound #8 Right,Medial Lower Leg o Change dressing every week Follow-up Appointments Wound #8 Right,Medial Lower Leg o Return Appointment in 1 week. VANESSA, KAMPF ([id number]) Edema Control Wound #8 Right,Medial Lower Leg o Kerlix and Coban - Right Lower Extremity Additional Orders / Instructions Wound #8 Right,Medial Lower Leg o Increase protein intake. o Other: - Left leg-Cleanse with soap and water and apply kerlix and ace wrap change on Tuesdays and Fridays by RN until pt get her own compression stockings/wraps. Electronic Signature(s) Signed:  06/05/2016 4:39:43 PM By: [name] MD Signed: 06/05/2016 4:44:01 PM By: [name] Entered By: [name] on 06/05/2016 14:12:05 Stough, [name] ([id number]) -------------------------------------------------------------------------------- Problem List Details [name] Date of Service: 06/05/2016 1:30 PM Patient Name: C. Patient Account Number: [id number] Medical Record Treating RN: [name] [id number] Number: Other Clinician: Aug 27, 1934 (81 y.o. Treating Rashon Westrup Date of Birth/Sex: Female) Physician/Extender: G Primary Care [name] Physician: Referring Physician: [name] in Treatment: 28 Active Problems ICD-10 Encounter Code  Description Active Date Diagnosis L97.812 Non-pressure chronic ulcer of other part of right lower leg 04/30/2016 Yes with fat layer exposed I87.333 Chronic venous hypertension (idiopathic) with ulcer and 04/03/2016 Yes inflammation of bilateral lower extremity I89.0 Lymphedema, not elsewhere classified 04/03/2016 Yes K74.25 Chronic systolic (congestive) heart failure 04/03/2016 Yes Inactive Problems Resolved Problems ICD-10 Code Description Active Date Resolved Date S81.812S Laceration without foreign body, left lower leg, sequela 04/03/2016 04/03/2016 Electronic Signature(s) Signed: 06/05/2016 4:39:43 PM By: [name] MD Entered By: [name] on 06/05/2016 14:40:39 Cerrito, [name] ([id number]) -------------------------------------------------------------------------------- Progress Note Details [name] Date of Service: 06/05/2016 1:30 PM Patient Name: C. Patient Account Number: [id number] Medical Record Treating RN: [name] [id number] Number: Other Clinician: May 26, 1935 (81 y.o. Treating Kam Kushnir, [place] Date of Birth/Sex: Female) Physician/Extender: G Primary Care [name] Physician: Referring Physician: [name] in Treatment: 28 Subjective Chief Complaint Information obtained from Patient Patient here for reevaluation of her right lower externally ulcer History of Present Illness (HPI) 11/22/15; this is Podgorski is a 81 year old woman who lives at home on her own. According to the patient and her daughter was present she has had long-standing edema in her legs dating back many years. She also has a history of chronic systolic heart failure, atrial fibrillation and is status post mitral valve replacement. Last echocardiogram I see in [facility] showed an ejection fraction of 40-45% she is on Lasix 60 mg a day and spironolactone 25 mg a day. Her current problem began in December around Christmas time she developed a small hematoma  in the medial part of her left leg which rapidly expanded to a very large hematoma that required surgical debridement. This situation was complicated by the fact that the patient is on long-standing Coumadin for mechanical heart valve. She went to the OR had this evacuated on January 4 /17. The wound has gradually improved however she has developed a small wounds around this area and more recently a wound on the right lateral leg. She is weeping edema fluid. The patient is already been to see vascular surgery. It was recommended that she wear Unna boots, she did not tolerate this due to pain in the left leg. She was then prescribed Juzo stockings and really can't get these on herself although truthfully there is probably too much edema for a Juzo stockings currently. She is not a diabetic and has no history of PAD or claudication that I could elicit. She does not use the external compression pumps reliably. She comes today with notes from her primary physician and Dr. Ronalee Belts both recommending various forms of compression but the patient does not really complied with them. Has been using the external compression pumps with some regularity but certainly not daily on the right leg and this has helped. I also note that her daughter tells me the history that she does not sleep in bed at home. She has a hospital bed but with her legs up she finds this painful so she sleeps in the  couch sitting up with her legs dependent. 11/29/15; the patient is arrives today accompanied by her son. He expresses satisfaction that she is maintained the compression all week. 12/06/15; the patient has kept her Profore light wraps on, we have good edema control no major change in the wounds we have been using Aquacel 12/13/15; changed to RTD last week. One of the 3 wounds on her left medial leg is healed she has 2 remaining wounds here and one on the right lateral leg. 12/18/2015 -- the patient was at Dr. Nino Parsley office  today and he was seeing her for an arterial study. While the wrap was being removed she had an inadvertent laceration of the left proximal anterior leg which bled quite profusely and a compression dressing was applied over this and the patient was here to get her Profore wraps done. I was asked to see the patient to make an assessment and treat appropriately. LENDY, DITTRICH ([id number]) 12/26/15; the patient's injury on the left proximal leg and Steri-Strips removed after soaking. There is an open area here. The original wounds to still open on the right lateral and left medial leg. 01/03/16 patient's injury on her proximal left leg looks quite good. Still small open area on the medial left leg which appears to be improving. The area on the right lateral leg still substantially open with no real improvement in wound depth. Her edema control is marginal with a Profore light. We have been using RTD for 3-4 weeks without any major change 01/10/16: wounds without s/s of infection. vascular results are pending regarding arterial studies. 01/17/16; patient comes in today complaining of severe pain however I think most of this is in the right hip not related to her wounds. She continues with a oval-shaped wound on the right lateral leg, trauma to the left anterior leg just below her tibial plateau. She has a smaller eschar on the left anterior leg. She is being using Prisma however she informs [place] today that she is actually allergic to silver, nose this from a previous application at [facility] some years ago 01/24/16; edema is not so well controlled today, I think I reduced her to [place] bilaterally last week. The area which was a scissor injury on her left upper anterior lower leg is fully epithelialized. She only has a small open area remaining on the medial aspect of her left leg. The oval-shaped wound on the right mid lateral leg may be a bit smaller. Debrided of surface slough nonviable  subcutaneous tissue. I had changed to collagen 2 weeks ago in an attempt to get this to close 01/31/16 all the patient's wounds on her left leg give healed. We have good edema control with bilateral Profore lites which she has been compliant with. She still has the oval-shaped wound on the right lateral calf allergic even this appears to be slowly improving. The patient has juxtalite stockings at home. She states she thinks she can put these on. She also has external compression pumps at home although I think her compliance with this has not been good in the past. She complains today of edema in her thighs. Tells me she takes 40 mg of Lasix daily 02/21/16; only 1 small wound remains on the lateral aspect of the right calf. She is using Juzo stockings on the left leg although she complains about difficulty in applying them. She has external compression pumps at home 02/28/16; the small open wound on her right lateral calf is improved in terms  of wound area. It appears that she has a wrap injury on the anterior aspect of the upper leg 03/06/16; the small open wound on her right lateral calf has a very small open area remaining. The wrap injury on the anterior aspect of the upper leg also appears better. She arrives today in clinic with a history of dyspnea with minimal exertion starting with the last 2-4 days. She does not describe chest pain. Her son and our intake nurse think she has facial swelling. She has a history of an artificial mitral valve on Coumadin 03/13/16; the small wound on the right lateral calf is no better this week. The superior wrap injury anteriorly requires debridement of surface eschar and nonviable subcutaneous tissue. She has been to her primary doctor with regards to her dyspnea we identified last week. Per the patient's description her Lasix has been increased 03/21/2016 -- patient of Dr. Dellia Nims who could not keep her appointment yesterday but has come in today with the  right lower leg looking good and this is the leg which has been treated in the recent past. However, her left lower extremity is extremely swollen, tender and edematous with redness and discoloration. 03/27/16; there is only 2 small areas remain on the right leg. The edema that was so concerning last week has come down however there is extensive bruising on the lateral left leg medial left foot suggesting that she lost some blood into the leg itself. I checked her hemoglobin on 9/21 was 14.5. Her INR was over 6. Duplex ultrasound was negative for DVT 04/03/16 at this point in time today patient is actually doing substantially better in regard to the wound on the anterior right lower extremity. currently there is no slough or eschar noted and no evidence of erythema, discharge, or local infection. She is exhibiting no signs of systemic infection. She is tolerating the dressing changes as well as the wrapping at this point in time. 04/24/16; I have not seen Mrs. Harral for 3 weeks. Apparently she was admitted to hospital for respiratory AYARI, LIWANAG. ([id number]) issues/also apparently has had multiple compression fractures and had kyphoplasty. When she was last here she only had one small open area remaining on the left leg she was using juxtalite stockings. Her son is upset about restarting the Coumadin which she blames or the swelling in her legs. She is on Coumadin for prophylaxis with a chronic artificial heart valve. 04/30/16 at this point in time patient has been tolerating the 3 layer compression wrap as well as the calcium alginate. 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 With tthat being said she does have the continued wound of the left lower extremity as well as the right medial lower extremity. The right side is significantly smaller compared to the left but actually is slough covered. fortunately she has no significant tenderness at rest although with manipulation of the  right location of the wound this is significantly tender. 05/07/16 for follow-up evaluation today both the patient's wounds appear to be significantly improved in size compared to last week. She is also having less pain at both locations currently. Her pain level at most is related to be a 1-2 out of 10. She does have some discharge but fortunately no evidence of infection at this point.she also continues to tolerate the compression wraps well. 05/14/16; the patient's wound on the left leg actually looks as though it's on its way to closure. She is using collagen to this area. She has  a small punched out area over the right medial calf. The cause of this is not really clear it has probably 0.4 cm of depth. She has an IV in her right hand which she states is for IV fluid when she develops low sodiums her potassiums, the etiology of this is not clear 05-21-16 she presents today with continued ulcerations the right lower extremity, posterior aspect. she has been using Iodoflex and compression therapy to the area. She denies any new injuries or trauma. She does have 2 areas proximal to this ulceration of dry crust along with an area to her left posterior lower leg that is purple discolored area appears similar to how her current ulceration started, she denies any trauma or injury to the left posterior leg we will monitor all of these areas. 05/28/16; the patient is down to 1 small open area on the right medial mid calf. This is however larger and in 50% of the surface area deeper approximately 0.4 cm. The reason for this deterioration is unclear. I've gone ahead and done a culture of this area she also tells [place] today that she is short of breath. She is also noted a swelling on her lower left eyelid 06/05/16; the patient is 1 small open area on the right medial mid calf. This looks about the same as last week. The deep area, medial 50% of the wound looks about the same. CandS I did last week was  negative. The entire area looks about the same Objective Constitutional Sitting or standing Blood Pressure is within target range for patient.. Pulse regular and within target range for patient.Marland Kitchen Respirations regular, non-labored and within target range.. Temperature is normal and within the target range for the patient.. Patient's appearance is neat and clean. Appears in no acute distress. Well nourished and well developed.. Vitals Time Taken: 1:47 PM, Height: 68 in, Weight: 178 lbs, BMI: 27.1, Temperature: 98.4 F, Pulse: 74 bpm, Respiratory Rate: 16 breaths/min, Blood Pressure: 118/78 mmHg. KAELIE, HENIGAN ([id number]) General Notes: Wound exam; the patient has a small open area on the right medial calf. Last week 50% of this had deteriorated with necrotic-looking material. The cause of this is not completely clear. CandS was negative. There is no evidence of surrounding infection Integumentary (Hair, Skin) Wound #8 status is Open. Original cause of wound was Trauma. The wound is located on the Right,Medial Lower Leg. The wound measures 0.7cm length x 1cm width x 0.2cm depth; 0.55cm^2 area and 0.11cm^3 volume. The wound is limited to skin breakdown. There is no tunneling or undermining noted. There is a large amount of serosanguineous drainage noted. The wound margin is distinct with the outline attached to the wound base. There is small (1-33%) red granulation within the wound bed. There is a large (67-100%) amount of necrotic tissue within the wound bed including Eschar and Adherent Slough. The periwound skin appearance exhibited: Localized Edema, Moist, Erythema. The surrounding wound skin color is noted with erythema which is circumferential. Periwound temperature was noted as No Abnormality. The periwound has tenderness on palpation. 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 Cleanse wound with mild soap and water No tub bath. - give sink or bed  baths Anesthetic: Wound #8 Right,Medial Lower Leg: Topical Lidocaine 4% cream applied to wound bed prior to debridement - for clinic use Skin Barriers/Peri-Wound Care: Wound #8 Right,Medial Lower Leg: Moisturizing lotion Primary Wound Dressing: Wound #8 Right,Medial Lower Leg: Iodoflex Secondary Dressing: CHARI, PARMENTER. ([id number]) Wound #8 Right,Medial Lower Leg: ABD pad Dry Gauze Dressing Change Frequency: Wound #8 Right,Medial Lower Leg: Change dressing every week Follow-up Appointments: Wound #8 Right,Medial Lower Leg: Return Appointment in 1 week. Edema Control: Wound #8 Right,Medial Lower Leg: Kerlix and Coban - Right Lower Extremity Additional Orders / Instructions: Wound #8 Right,Medial Lower Leg: Increase protein intake. Other: - Left leg-Cleanse with soap and water and apply kerlix and ace wrap change on Tuesdays and Fridays by RN until pt get her own compression stockings/wraps. #15 change the primary dressing to Iodoflex/ABD Electronic Signature(s) Signed: 06/05/2016 4:39:43 PM By: [name] MD Entered By: [name] on 06/05/2016 14:44:30 Goughnour, [name] ([id number]) -------------------------------------------------------------------------------- SuperBill Details Patient Name: [name] Date of Service: 06/05/2016 Medical Record Patient Account Number: [id number] Number: Treating RN: [name] June 10, 1935 (80 y.o. Other Clinician: Date of Birth/Sex: Female) Treating Paddy Walthall Primary Care Physician/Extender: Claudette Laws Physician: Suella Grove in Treatment:  28 Referring Physician: [name] Diagnosis Coding ICD-10 Codes Code Description [phone] Non-pressure chronic ulcer of other part of right lower leg with fat layer exposed Chronic venous hypertension (idiopathic) with ulcer and inflammation of bilateral lower I87.333 extremity I89.0 Lymphedema, not elsewhere classified T70.17 Chronic systolic (congestive) heart failure Facility Procedures CPT4 Code: [id number] Description: 99214 - WOUND CARE VISIT-LEV 4 EST PT Modifier: Quantity: 1 Physician Procedures CPT4: Description Modifier Quantity Code [id number] - WC PHYS LEVEL 2 - EST PT 1 ICD-10 Description Diagnosis L97.812 Non-pressure chronic ulcer of other part of right lower leg with fat layer exposed Electronic Signature(s) Signed: 06/05/2016 4:39:43 PM By: [name] MD Signed: 06/05/2016 4:44:01 PM By: [name] Entered By: [name] on 06/05/2016 14:52:45

## 2016-06-06 NOTE — Progress Notes (Signed)
CLEVELAND, PAIZ ([id number]) Visit Report for 06/05/2016 Arrival Information Details Patient Name: [name], [name]. Date of Service: 06/05/2016 1:30 PM Medical Record Patient Account Number: [id number] Number: Treating RN: [name] [birth year] (80 y.o. Other Clinician: Date of Birth/Sex: Female) Treating [name], [name] Primary Care Physician/Extender: Claudette Laws Physician: Referring Physician: [name] in Treatment: 28 Visit Information History Since Last Visit All ordered tests and consults were completed: No Patient Arrived: Wheel Chair Added or deleted any medications: No Arrival Time: 13:45 Any new allergies or adverse reactions: No Accompanied By: caregiver Had a fall or experienced change in No Transfer Assistance: EasyPivot Patient activities of daily living that may affect Lift risk of falls: Patient Identification Verified: Yes Signs or symptoms of abuse/neglect since last No Secondary Verification Process Yes visito Completed: Hospitalized since last visit: No Patient Requires Transmission- No Pain Present Now: No Based Precautions: Patient Has Alerts: Yes Patient Alerts: Patient on Blood Thinner [place] Signature(s) Signed: 06/05/2016 4:44:01 PM By: [name] Entered By: [name] on 06/05/2016 13:45:48 Senske, Noretta Loletha [name] ([id number]) -------------------------------------------------------------------------------- Clinic Level of Care Assessment Details Patient Name: Carrie Mulligan C. Date of Service: 06/05/2016 1:30 PM Medical Record Patient Account Number: [id number] Number: Treating RN: [name] June 09, 1935 (80 y.o. Other Clinician: Date of Birth/Sex: Female) Treating [name], [name] Primary Care Physician/Extender: Claudette Laws Physician: Referring Physician: [name] in Treatment: 28 Clinic Level of Care Assessment  Items TOOL 4 Quantity Score X - Use when only an EandM is performed on FOLLOW-UP visit 1 0 ASSESSMENTS - Nursing Assessment / Reassessment X - Reassessment of Co-morbidities (includes updates in patient status) 1 10 X - Reassessment of Adherence to Treatment Plan 1 5 ASSESSMENTS - Wound and Skin Assessment / Reassessment X - Simple Wound Assessment / Reassessment - one wound 1 5 []  - Complex Wound Assessment / Reassessment - multiple wounds 0 []  - Dermatologic / Skin Assessment (not related to wound area) 0 ASSESSMENTS - Focused Assessment X - Circumferential Edema Measurements - multi extremities 1 5 []  - Nutritional Assessment / Counseling / Intervention 0 []  - Lower Extremity Assessment (monofilament, tuning fork, pulses) 0 []  - Peripheral Arterial Disease Assessment (using hand held doppler) 0 ASSESSMENTS - Ostomy and/or Continence Assessment and Care []  - Incontinence Assessment and Management 0 []  - Ostomy Care Assessment and Management (repouching, etc.) 0 PROCESS - Coordination of Care []  - Simple Patient / Family Education for ongoing care 0 X - Complex (extensive) Patient / Family Education for ongoing care 1 20 X - Staff obtains Consents, Records, Test Results / Process Orders 1 10 [name], [name] ([id number]) X - Staff telephones HHA, Nursing Homes / Clarify orders / etc 1 10 []  - Routine Transfer to another Facility (non-emergent condition) 0 []  - Routine Hospital Admission (non-emergent condition) 0 []  - New Admissions / Biomedical engineer / Ordering NPWT, Apligraf, etc. 0 []  - Emergency Hospital Admission (emergent condition) 0 X - Simple Discharge Coordination 1 10 []  - Complex (extensive) Discharge Coordination 0 PROCESS - Special Needs []  - Pediatric / Minor Patient Management 0 []  - Isolation Patient Management 0 []  - Hearing / Language / Visual special needs 0 []  - Assessment of Community assistance (transportation, D/C planning, etc.) 0 []  -  Additional assistance / Altered mentation 0 []  - Support Surface(s) Assessment (bed, cushion, seat, etc.) 0 INTERVENTIONS - Wound Cleansing / Measurement []  - Simple Wound Cleansing - one wound 0 X - Complex Wound Cleansing -  multiple wounds 1 5 X - Wound Imaging (photographs - any number of wounds) 1 5 []  - Wound Tracing (instead of photographs) 0 X - Simple Wound Measurement - one wound 1 5 []  - Complex Wound Measurement - multiple wounds 0 INTERVENTIONS - Wound Dressings []  - Small Wound Dressing one or multiple wounds 0 X - Medium Wound Dressing one or multiple wounds 1 15 []  - Large Wound Dressing one or multiple wounds 0 X - Application of Medications - topical 1 5 []  - Application of Medications - injection 0 [name], [name] ([id number]) INTERVENTIONS - Miscellaneous []  - External ear exam 0 []  - Specimen Collection (cultures, biopsies, blood, body fluids, etc.) 0 []  - Specimen(s) / Culture(s) sent or taken to Lab for analysis 0 X - Patient Transfer (multiple staff / Civil Service fast streamer / Similar devices) 1 10 []  - Simple Staple / Suture removal (25 or less) 0 []  - Complex Staple / Suture removal (26 or more) 0 []  - Hypo / Hyperglycemic Management (close monitor of Blood Glucose) 0 []  - Ankle / Brachial Index (ABI) - do not check if billed separately 0 X - Vital Signs 1 5 Has the patient been seen at the hospital within the last three years: Yes Total Score: 125 Level Of Care: New/Established - Level 4 Electronic Signature(s) Signed: 06/05/2016 4:44:01 PM By: [name] Entered By: [name] on 06/05/2016 14:52:33 [name], [name] ([id number]) -------------------------------------------------------------------------------- Encounter Discharge Information Details Patient Name: Carrie Mulligan C. Date of Service: 06/05/2016 1:30 PM Medical Record Patient Account Number: [id number] Number: Treating RN: [name] Nov 18, 1934 (80 y.o. Other  Clinician: Date of Birth/Sex: Female) Treating [name], [name] Primary Care Physician/Extender: Claudette Laws Physician: Referring Physician: [name] in Treatment: 28 Encounter Discharge Information Items Discharge Pain Level: 0 Discharge Condition: Stable Ambulatory Status: Wheelchair Discharge Destination: Nursing Home Transportation: Other Accompanied By: caregiver Schedule Follow-up Appointment: Yes Medication Reconciliation completed and provided to Patient/Care Yes Nahomy [place]: Provided on Clinical Summary of Care: 06/05/2016 Form Type Recipient Paper Patient [place] Electronic Signature(s) Signed: 06/05/2016 2:25:24 PM By: Ruthine Dose Entered By: Ruthine Dose on 06/05/2016 14:25:24 [name], [name] ([id number]) -------------------------------------------------------------------------------- Lower Extremity Assessment Details Patient Name: [name], [name] Date of Service: 06/05/2016 1:30 PM Medical Record Patient Account Number: [id number] Number: Treating RN: [name] 31-Jul-1934 (80 y.o. Other Clinician: Date of Birth/Sex: Female) Treating [name], [name] Primary Care Physician/Extender: Claudette Laws Physician: Referring Physician: [name] in Treatment: 28 Edema Assessment Assessed: [Left: No] [Right: No] E[Left: dema] [Right: :] Calf Left: Right: Point of Measurement: 34 cm From Medial Instep cm 29.6 cm Ankle Left: Right: Point of Measurement: 10 cm From Medial Instep cm 20.8 cm Vascular Assessment Pulses: Posterior Tibial Dorsalis Pedis Palpable: [Right:Yes] Extremity colors, hair growth, and conditions: Extremity Color: [Right:Mottled] Temperature of Extremity: [Right:Warm] Capillary Refill: [Right:< 3 seconds] Toe Nail Assessment Left: Right: Thick: Yes Discolored: Yes Deformed: No Improper Length and Hygiene: Yes Electronic Signature(s) Signed: 06/05/2016 4:44:01 PM By: [name] Entered  By: [name] on 06/05/2016 13:52:22 [name], [name] ([id number]) [place], [place]. ([id number]) -------------------------------------------------------------------------------- Multi Wound Chart Details Patient Name: [name], [name] Date of Service: 06/05/2016 1:30 PM Medical Record Patient Account Number: [id number] Number: Treating RN: [name] Dec 24, 1934 (80 y.o. Other Clinician: Date of Birth/Sex: Female) Treating [name], [name] Primary Care Physician/Extender: Claudette Laws Physician: Referring Physician: [name] in Treatment: 28 Vital Signs Height(in): 68 Pulse(bpm): 74 Weight(lbs): 178 Blood Pressure 118/78 (mmHg): Body  Mass Index(BMI): 27 Temperature(F): 98.4 Respiratory Rate 16 (breaths/min): Photos: [8:No Photos] [N/A:N/A] Wound Location: [8:Right Lower Leg - Medial] [N/A:N/A] Wounding Event: [8:Trauma] [N/A:N/A] Primary Etiology: [8:Trauma, Other] [N/A:N/A] Comorbid History: [8:Cataracts, Arrhythmia, Hypotension, Osteoarthritis] [N/A:N/A] Date Acquired: [8:04/06/2016] [N/A:N/A] Weeks of Treatment: [8:6] [N/A:N/A] Wound Status: [8:Open] [N/A:N/A] Measurements L x W x D 0.7x1x0.2 [N/A:N/A] (cm) Area (cm) : [8:0.55] [N/A:N/A] Volume (cm) : [8:0.11] [N/A:N/A] % Reduction in Area: [8:-150.00%] [N/A:N/A] % Reduction in Volume: -400.00% [N/A:N/A] Classification: [8:Partial Thickness] [N/A:N/A] Exudate Amount: [8:Large] [N/A:N/A] Exudate Type: [8:Serosanguineous] [N/A:N/A] Exudate Color: [8:red, brown] [N/A:N/A] Wound Margin: [8:Distinct, outline attached] [N/A:N/A] Granulation Amount: [8:Small (1-33%)] [N/A:N/A] Granulation Quality: [8:Red] [N/A:N/A] Necrotic Amount: [8:Large (67-100%)] [N/A:N/A] Necrotic Tissue: [8:Eschar, Adherent Slough] [N/A:N/A] Exposed Structures: [8:Fascia: No Fat: No] [N/A:N/A] Tendon: No Muscle: No Joint: No Bone: No Limited to Skin Breakdown Epithelialization: None N/A  N/A Periwound Skin Texture: Edema: Yes N/A N/A Periwound Skin Moist: Yes N/A N/A Moisture: Periwound Skin Color: Erythema: Yes N/A N/A Erythema Location: Circumferential N/A N/A Temperature: No Abnormality N/A N/A Tenderness on Yes N/A N/A Palpation: Wound Preparation: Ulcer Cleansing: Other: N/A N/A soap and water Topical Anesthetic Applied: Other: lidocaine 4% Treatment Notes Electronic Signature(s) Signed: 06/05/2016 4:44:01 PM By: [name] Entered By: [name] on 06/05/2016 14:01:46 [name], [name] ([id number]) -------------------------------------------------------------------------------- Multi-Disciplinary Care Plan Details Patient Name: Carrie Kussmaul. Date of Service: 06/05/2016 1:30 PM Medical Record Patient Account Number: [id number] Number: Treating RN: [name] 1935-03-03 (80 y.o. Other Clinician: Date of Birth/Sex: Female) Treating [name], [name] Primary Care Physician/Extender: Claudette Laws Physician: Referring Physician: [name] in Treatment: 28 Active Inactive Orientation to the Wound Care Program Nursing Diagnoses: Knowledge deficit related to the wound healing center program Goals: Patient/caregiver will verbalize understanding of the [facility] Program Date Initiated: 11/22/2015 Goal Status: Active Interventions: Provide education on orientation to the wound center Notes: Venous Leg Ulcer Nursing Diagnoses: Knowledge deficit related to disease process and management Potential for venous Insuffiency (use before diagnosis confirmed) Goals: Patient will maintain optimal edema control Date Initiated: 11/22/2015 Goal Status: Active Patient/caregiver will verbalize understanding of disease process and disease management Date Initiated: 11/22/2015 Goal Status: Active Verify adequate tissue perfusion prior to therapeutic compression application Date Initiated: 11/22/2015 Goal Status:  Active Interventions: Assess peripheral edema status every visit. TEAIRA, CROFT ([id number]) Compression as ordered Provide education on venous insufficiency Treatment Activities: Non-invasive vascular studies : 11/22/2015 Therapeutic compression applied : 11/22/2015 Notes: Wound/Skin Impairment Nursing Diagnoses: Impaired tissue integrity Knowledge deficit related to ulceration/compromised skin integrity Goals: Patient/caregiver will verbalize understanding of skin care regimen Date Initiated: 11/22/2015 Goal Status: Active Ulcer/skin breakdown will have a volume reduction of 30% by week 4 Date Initiated: 11/22/2015 Goal Status: Active Ulcer/skin breakdown will have a volume reduction of 50% by week 8 Date Initiated: 11/22/2015 Goal Status: Active Ulcer/skin breakdown will have a volume reduction of 80% by week 12 Date Initiated: 11/22/2015 Goal Status: Active Ulcer/skin breakdown will heal within 14 weeks Date Initiated: 11/22/2015 Goal Status: Active Interventions: Assess patient/caregiver ability to perform ulcer/skin care regimen upon admission and as needed Assess ulceration(s) every visit Provide education on ulcer and skin care Treatment Activities: Skin care regimen initiated : 11/22/2015 Topical wound management initiated : 11/22/2015 Notes: Electronic Signature(s) Signed: 06/05/2016 4:44:01 PM By: De Burrs, [name] ([id number]) Entered By: [name] on 06/05/2016 14:01:40 Balderrama, [name] ([id number]) -------------------------------------------------------------------------------- Pain Assessment Details Patient Name: Carrie Mulligan C. Date of Service: 06/05/2016 1:30 PM Medical Record Patient  Account Number: [id number] Number: Treating RN: [name] 1934-12-22 (80 y.o. Other Clinician: Date of Birth/Sex: Female) Treating [name], [name] Primary Care Physician/Extender: Claudette Laws Physician: Referring  Physician: [name] in Treatment: 28 Active Problems Location of Pain Severity and Description of Pain Patient Has Paino No Site Locations With Dressing Change: No Pain Management and Medication Current Pain Management: Electronic Signature(s) Signed: 06/05/2016 4:44:01 PM By: [name] Entered By: [name] on 06/05/2016 13:46:56 [name], [name] ([id number]) -------------------------------------------------------------------------------- Patient/Caregiver Education Details Patient Name: Carrie Kussmaul. Date of Service: 06/05/2016 1:30 PM Medical Record Patient Account Number: [id number] Number: Treating RN: [name] 1934-12-28 (80 y.o. Other Clinician: Date of Birth/Gender: Female) Treating [name], [name] Primary Care Physician/Extender: Claudette Laws Physician: Suella [place] in Treatment: 28 Referring Physician: [name] Education Assessment Education Provided To: Patient Education Topics Provided Wound/Skin Impairment: Handouts: Other: change dressing as ordered Methods: Demonstration, Explain/Verbal Responses: State content correctly Electronic Signature(s) Signed: 06/05/2016 4:44:01 PM By: [name] Entered By: [name] on 06/05/2016 14:02:28 [name], [name] ([id number]) -------------------------------------------------------------------------------- Wound Assessment Details Patient Name: [name], Annisha C. Date of Service: 06/05/2016 1:30 PM Medical Record Patient Account Number: [id number] Number: Treating RN: [name] Oct 18, 1934 (80 y.o. Other Clinician: Date of Birth/Sex: Female) Treating [name], [name] Primary Care Physician/Extender: Claudette Laws Physician: Referring Physician: [name] in Treatment: 28 Wound Status Wound Number: 8 Primary Trauma, Other Etiology: Wound Location: Right Lower Leg - Medial Wound Status: Open Wounding Event:  Trauma Comorbid Cataracts, Arrhythmia, Hypotension, Date Acquired: 04/06/2016 History: Osteoarthritis Weeks Of Treatment: 6 Clustered Wound: No Photos Photo Uploaded By: [name] on 06/05/2016 14:05:55 Wound Measurements Length: (cm) 0.7 Width: (cm) 1 Depth: (cm) 0.2 Area: (cm) 0.55 Volume: (cm) 0.11 % Reduction in Area: -150% % Reduction in Volume: -400% Epithelialization: None Tunneling: No Undermining: No Wound Description Classification: Partial Thickness Wound Margin: Distinct, outline attached Exudate Amount: Large Exudate Type: Serosanguineous Exudate Color: red, brown Foul Odor After Cleansing: No Wound Bed Granulation Amount: Small (1-33%) Exposed Structure Granulation Quality: Red Fascia Exposed: No Nida, Taron C. ([id number]) Necrotic Amount: Large (67-100%) Fat Layer Exposed: No Necrotic Quality: Eschar, Adherent Slough Tendon Exposed: No Muscle Exposed: No Joint Exposed: No Bone Exposed: No Limited to Skin Breakdown Periwound Skin Texture Texture Color No Abnormalities Noted: No No Abnormalities Noted: No Localized Edema: Yes Erythema: Yes Erythema Location: Circumferential Moisture No Abnormalities Noted: No Temperature / Pain Moist: Yes Temperature: No Abnormality Tenderness on Palpation: Yes Wound Preparation Ulcer Cleansing: Other: soap and water, Topical Anesthetic Applied: Other: lidocaine 4%, Treatment Notes Wound #8 (Right, Medial Lower Leg) 1. Cleansed with: Clean wound with Normal Saline Cleanse wound with antibacterial soap and water 2. Anesthetic Topical Lidocaine 4% cream to wound bed prior to debridement 4. Dressing Applied: Iodoflex 5. Secondary Dressing Applied ABD Pad Foam 7. Secured with Tape Notes kerlix and Event organiser) Signed: 06/05/2016 4:44:01 PM By: [name] Entered By: [name] on 06/05/2016 13:58:27 Athey, Gowri Loletha [name]  ([id number]) -------------------------------------------------------------------------------- Vitals Details Patient Name: Carrie Mulligan C. Date of Service: 06/05/2016 1:30 PM Medical Record Patient Account Number: [id number] Number: Treating RN: [name] 08/13/1934 (80 y.o. Other Clinician: Date of Birth/Sex: Female) Treating [name], [name] Primary Care Physician/Extender: Claudette Laws Physician: Referring Physician: [name] in Treatment: 28 Vital Signs Time Taken: 13:47 Temperature (F): 98.4 Height (in): 68 Pulse (bpm): 74 Weight (lbs): 178 Respiratory Rate (breaths/min): 16 Body Mass Index (BMI): 27.1 Blood Pressure (mmHg): 118/78 Reference  Range: 80 - 120 mg / dl Electronic Signature(s) Signed: 06/05/2016 4:44:01 PM By: [name] Entered By: [name] on 06/05/2016 13:47:58

## 2016-06-07 ENCOUNTER — Non-Acute Institutional Stay (SKILLED_NURSING_FACILITY): Payer: Medicare Other | Admitting: Gerontology

## 2016-06-07 DIAGNOSIS — R52 Pain, unspecified: Secondary | ICD-10-CM

## 2016-06-07 DIAGNOSIS — Z5181 Encounter for therapeutic drug level monitoring: Secondary | ICD-10-CM

## 2016-06-07 LAB — CBC
HCT: 39.7 % (ref 35.0–47.0)
Hemoglobin: 13.3 g/dL (ref 12.0–16.0)
MCH: 28 pg (ref 26.0–34.0)
MCHC: 33.4 g/dL (ref 32.0–36.0)
MCV: 83.6 fL (ref 80.0–100.0)
PLATELETS: 277 10*3/uL (ref 150–440)
RBC: 4.75 MIL/uL (ref 3.80–5.20)
RDW: 18.2 % — ABNORMAL HIGH (ref 11.5–14.5)
WBC: 4.6 10*3/uL (ref 3.6–11.0)

## 2016-06-07 LAB — BASIC METABOLIC PANEL
Anion gap: 8 (ref 5–15)
BUN: 20 mg/dL (ref 6–20)
CALCIUM: 9.5 mg/dL (ref 8.9–10.3)
CO2: 29 mmol/L (ref 22–32)
CREATININE: 1.17 mg/dL — AB (ref 0.44–1.00)
Chloride: 98 mmol/L — ABNORMAL LOW (ref 101–111)
GFR calc non Af Amer: 43 mL/min — ABNORMAL LOW (ref >60)
GFR, EST AFRICAN AMERICAN: 50 mL/min — AB (ref >60)
GLUCOSE: 84 mg/dL (ref 65–99)
Potassium: 4.2 mmol/L (ref 3.5–5.1)
Sodium: 135 mmol/L (ref 135–145)

## 2016-06-07 LAB — PROTIME-INR
INR: 2.66
PROTHROMBIN TIME: 28.9 s — AB (ref 11.4–15.2)

## 2016-06-08 NOTE — Progress Notes (Signed)
Location:      Place of Service:  SNF (31) Provider:  Toni Arthurs, NP-C  Rusty Aus, MD  Patient Care Team: Rusty Aus, MD as PCP - General (Internal Medicine) Robert Bellow, MD as Consulting Physician (General Surgery)  Extended Emergency Contact Information Primary Emergency Contact: [name],[name] Address: [address]          [place], [place] [address] [name] of [place] Phone: [phone] Relation: Daughter Secondary Emergency Contact: [name] [place] of [place] Mobile Phone: [phone] Relation: Son  Code Status:  DO NOT RESUSCITATE Goals of care: Advanced Directive information Advanced Directives 04/10/2016  Does Patient Have a Medical Advance Directive? No  Would patient like information on creating a medical advance directive? No - patient declined information     Chief Complaint  Patient presents with  . Follow-up    HPI:  Pt is a 81 y.o. female seen today for an acute visit for ongoing management of intractable pain. Pain has been worsening as patient now has multiple compression fractures. Patient is scheduled for kyphoplasty next Thursday. Patient is on chronic Coumadin for DVT prophylaxis for mechanical heart valve and A. Fib. Due to the upcoming surgery,patient must be taken off of Coumadin in order for her to have an INR of less than 1.5. Patient had a lot of questions the DVT prophylaxis before and after surgery. I educated patient on the plan of care for prior to surgery. Informed her that the plan of care after surgery would be dependent on the surgeon's preference. As of today the INR is 2.66. All questions answered. Vital signs stable. No other complaints.   Past Medical History:  Diagnosis Date  . Arthritis    "back, hands, knees" (04/10/2016)  . Chronic atrial fibrillation ([place])    a. On Coumadin  . Chronic back pain   . Chronic combined systolic (congestive) and diastolic (congestive) heart failure    a. EF  25% by echo in 08/2015 b. RHC in 08/2015 showed normal filling pressures  . Compression fracture    "several; all in my back" (04/10/2016)  . GERD (gastroesophageal reflux disease)   . Heart murmur   . Hypertension   . S/P MVR (mitral valve replacement)    a. MVR 1994 b. redo MVR in 08/2014 - on Coumadin  . Shortness of breath dyspnea    Past Surgical History:  Procedure Laterality Date  . BREAST LUMPECTOMY Right 2005?   benign lump excision  . CARDIAC CATHETERIZATION    . [place], 2016   "MVR; MVR"  . CATARACT EXTRACTION W/PHACO Right 02/20/2015   Procedure: CATARACT EXTRACTION PHACO AND INTRAOCULAR LENS PLACEMENT (IOC);  Surgeon: Estill Cotta, MD;  Location: [facility] ORS;  Service: Ophthalmology;  Laterality: Right;  US01:31.2 AP 25.3% CDE40.13 Fluid lot # [id number]  . ELECTROPHYSIOLOGIC STUDY N/A 10/09/2015   Procedure: CARDIOVERSION;  Surgeon: Wellington Hampshire, MD;  Location: [facility] ORS;  Service: Cardiovascular;  Laterality: N/A;  . IR GENERIC HISTORICAL  04/01/2016   IR RADIOLOGIST EVAL & MGMT 04/01/2016 [facility]  . IR GENERIC HISTORICAL  04/15/2016   IR VERTEBROPLASTY CERV/THOR BX INC UNI/BIL INC/INJECT/IMAGING 04/15/2016 Luanne Bras, MD [facility]  . IR GENERIC HISTORICAL  04/15/2016   IR VERTEBROPLASTY EA ADDL (T&LS) BX INC UNI/BIL INC INJECT/IMAGING 04/15/2016 Luanne Bras, MD [facility]  . IR GENERIC HISTORICAL  04/15/2016   IR VERTEBROPLASTY EA ADDL (T&LS) BX INC UNI/BIL INC INJECT/IMAGING 04/15/2016 Luanne Bras, MD [facility]  . IR GENERIC HISTORICAL  05/20/2016   IR RADIOLOGIST EVAL & MGMT 05/20/2016 [facility]  . IRRIGATION AND DEBRIDEMENT HEMATOMA Left 07/05/2015   Procedure: IRRIGATION AND DEBRIDEMENT HEMATOMA;  Surgeon: Robert Bellow, MD;  Location: [facility] ORS;  Service: General;  Laterality: Left;  . pyloric stenosis  07/1937  . US ECHOCARDIOGRAPHY      Allergies  Allergen Reactions  . Ace Inhibitors      Other reaction(s): Cough  . Hydrocodone Itching and Nausea Only  . Mercury     Other reaction(s): Unknown  . Nickel   . Silver Dermatitis    Severe itching  . Cephalexin Rash  . Clindamycin/Lincomycin Rash  . Penicillins Rash    Has patient had a PCN reaction causing immediate rash, facial/tongue/throat swelling, SOB or lightheadedness with hypotension: YES Has patient had a PCN reaction causing severe rash involving mucus membranes or skin necrosis: NO Has patient had a PCN reaction that required hospitalization NO Has patient had a PCN reaction occurring within the last 10 years: NO If all of the above answers are "NO", then may proceed with Cephalosporin use.      Medication List       Accurate as of 06/07/16 11:59 PM. Always use your most recent med list.          acetaminophen 500 MG tablet Commonly known as:  TYLENOL Take 500 mg by mouth every 6 (six) hours as needed for mild pain, moderate pain, fever or headache.   aspirin EC 81 MG tablet Take 81 mg by mouth daily.   feeding supplement (ENSURE ENLIVE) Liqd Take 237 mLs by mouth 2 (two) times daily between meals.   furosemide 40 MG tablet Commonly known as:  LASIX Take 40 mg by mouth 2 (two) times daily.   liver oil-zinc oxide 40 % ointment Commonly known as:  DESITIN Apply 1 application topically 3 (three) times daily. Apply to bed sore   metoprolol succinate 25 MG 24 hr tablet Commonly known as:  TOPROL-XL Take 1 tablet (25 mg total) by mouth daily.   oxyCODONE 5 MG immediate release tablet Commonly known as:  Oxy IR/ROXICODONE Take 1 tablet (5 mg total) by mouth every 4 (four) hours.   pantoprazole 40 MG tablet Commonly known as:  PROTONIX Take 40 mg by mouth daily.   polyethylene glycol packet Commonly known as:  MIRALAX / GLYCOLAX Take 17 g by mouth 2 (two) times daily.   senna-docusate 8.6-50 MG tablet Commonly known as:  Senokot-S Take 1 tablet by mouth 2 (two) times daily.   spironolactone  25 MG tablet Commonly known as:  ALDACTONE Take 25 mg by mouth 2 (two) times daily.   warfarin 5 MG tablet Commonly known as:  COUMADIN Take 1 tablet (5 mg total) by mouth daily.       Review of Systems  Constitutional: Negative for activity change, appetite change, chills, diaphoresis and fever.  Respiratory: Negative for apnea, cough, choking, chest tightness, shortness of breath and wheezing.   Cardiovascular: Negative for chest pain, palpitations and leg swelling.  Gastrointestinal: Negative for abdominal distention, abdominal pain, anal bleeding, blood in stool, constipation, diarrhea and nausea.  Genitourinary: Negative for difficulty urinating, dysuria, frequency, hematuria, urgency and vaginal bleeding.  Musculoskeletal: Positive for arthralgias (typical arthritis), back pain and myalgias. Negative for gait problem.  Skin: Positive for wound. Negative for color change, pallor and rash.  Neurological: Negative for dizziness, tremors, syncope, speech difficulty, weakness, numbness and headaches.  All  other systems reviewed and are negative.    There is no immunization history on file for this patient. Pertinent  Health Maintenance Due  Topic Date Due  . DEXA SCAN  06/28/2002  . PNA vac Low Risk Adult (1 of 2 - PCV13) 06/28/2002  . INFLUENZA VACCINE  01/30/2016   Fall Risk  09/25/2015  Falls in the past year? No   Functional Status Survey:    Vitals:   06/07/16 0600  BP: (!) 160/72  Pulse: 79  Resp: 19  Temp: 98.2 F (36.8 C)  SpO2: 95%  Weight: 141 lb 14.4 oz (64.4 kg)   Body mass index is 22.9 kg/m. Physical Exam  Constitutional: She is oriented to person, place, and time. Vital signs are normal. She appears well-developed and well-nourished. She is active and cooperative. She does not appear ill. No distress.  HENT:  Head: Normocephalic and atraumatic.  Mouth/Throat: Uvula is midline, oropharynx is clear and moist and mucous membranes are normal. Mucous  membranes are not pale, not dry and not cyanotic.  Eyes: Conjunctivae, EOM and lids are normal. Pupils are equal, round, and reactive to light.  Neck: Trachea normal, normal range of motion and full passive range of motion without pain. Neck supple. No JVD present. No tracheal deviation, no edema and no erythema present. No thyromegaly present.  Cardiovascular: Normal rate, regular rhythm, normal heart sounds, intact distal pulses and normal pulses.  Exam reveals no gallop, no distant heart sounds and no friction rub.   No murmur heard. Pulmonary/Chest: Effort normal and breath sounds normal. No accessory muscle usage. No respiratory distress. She has no wheezes. She has no rales. She exhibits no tenderness.  Abdominal: Normal appearance and bowel sounds are normal. She exhibits no distension and no ascites. There is no tenderness.  Musculoskeletal: She exhibits no edema.       Lumbar back: She exhibits decreased range of motion (multiple compression fractures), tenderness, bony tenderness and pain.  Expected osteoarthritis, stiffness  Neurological: She is alert and oriented to person, place, and time. She has normal strength.  Skin: Skin is warm, dry and intact. Lesion (small, 2 cm diameter vascular wound with eschar on the posterior right lower leg with small 1 cm diameter satellite lesion proximal. No drainage or erythema) noted. No rash noted. She is not diaphoretic. No cyanosis or erythema. No pallor. Nails show no clubbing.  Psychiatric: She has a normal mood and affect. Her speech is normal and behavior is normal. Judgment and thought content normal. Cognition and memory are normal.  Nursing note and vitals reviewed.   Labs reviewed:  Recent Labs  06/03/16 0540 06/05/16 0645 06/07/16 0641  NA 136 137 135  K 4.4 4.2 4.2  CL 100* 100* 98*  CO2 28 29 29   GLUCOSE 78 69 84  BUN 19 22* 20  CREATININE 1.30* 1.36* 1.17*  CALCIUM 9.6 9.6 9.5    Recent Labs  05/20/16 0435  05/21/16 1625 06/01/16 0500  AST 24 25 18   ALT 18 19 16   ALKPHOS 122 151* 129*  BILITOT 0.9 0.6 1.0  PROT 7.0 7.5 6.7  ALBUMIN 3.9 4.3 3.5    Recent Labs  05/26/16 0739  06/03/16 0540 06/05/16 0645 06/07/16 0641  WBC 4.0  < > 3.7 3.2* 4.6  NEUTROABS 2.6  --  2.2 1.6  --   HGB 12.4  < > 12.7 12.3 13.3  HCT 37.9  < > 38.5 37.3 39.7  MCV 85.7  < > 85.8 84.6  83.6  PLT 181  < > 222 233 277  < > = values in this interval not displayed. Lab Results  Component Value Date   TSH 2.239 04/13/2016   No results found for: HGBA1C No results found for: CHOL, HDL, LDLCALC, LDLDIRECT, TRIG, CHOLHDL  Significant Diagnostic Results in last 30 days:  Ir Radiologist Eval & Mgmt  Result Date: 05/21/2016 EXAM: ESTABLISHED PATIENT OFFICE VISIT CHIEF COMPLAINT: The patient is a 81 year old right-handed lady who presents accompanied by her daughter in follow-up to a vertebral body augmentation at T6-T7 and L5 approximately 2 weeks ago. According to the patient and also her daughter, the main improvement has been noted since the procedures has been improved mobility with a walker with less pain on standing. Otherwise, the patient reports no significant change in her pain overall. She continues to have low back pain which is almost constant and radiates into the iliac regions. The pain does not radiate in a radicular manner into the lower extremities. She has no autonomic dysfunction of her bowel or bladder activity. With the oxycodone, her pain is controlled to a level 5 out of 10 in terms of intensity. Since her visitation the last time the patient reports significantly improved appetite. She completes 3 meals a day. Her weight has been steady and slightly increased to 137 pounds. She used to be at least 158 pounds prior to the development of her fractures. She denies any recent chills, fever or rigors. She denies any UTI symptoms of dysuria, polyuria or of hematuria. She denies recent chest pain or  shortness of breath. Recently she had a traumatic hematoma in one of her feet which was evacuated and is in the process of healing. Current Pain Level: 1-10 HISTORY OF PRESENT ILLNESS: As above. Past medical history, family history and social history as per previously unchanged. She is still at [facility] where she undergoes ambulation. Present medications: Acetaminophen, aspirin, Ensure, furosemide, liver oil-zinc oxide, metoprolol, oxycodone every 6 hours prn, Protonix, MiraLax, Senokot, spironolactone, and warfarin. Allergies: Ace inhibitors, hydrocodone, mercury, nickel, silver, cephalexin, clindamycin, lincomycin and penicillins. PHYSICAL EXAMINATION: In mild to moderate distress on account of her pain. Patient sitting in a wheelchair slouched forward. Neurologically intact completely. The patient demonstrating no evidence of tenderness in the thoracic and lower thoracic regions. However, moderately severely tender in the lower lumbar regions at L2-L4. ASSESSMENT AND PLAN: Previous MRI scan of the thoracolumbar spine was reviewed with her and her daughter. The L2-L4 levels had areas of signal abnormality suggestive of subacute compression fractures. Also noted was abnormal signal in the sacral ala more prominent on the right-side. The option of performing vertebral body augmentation at L2, L4 and the right-side of the sacrum was again discussed with the patient and the patient's daughter. The patient wants to see how much more of her physical activity that she can tolerate with the present pain and pain medications. To this affect, it was decided to continue with the conservative management with gradually improving her level of activity as tolerated in rehab. Should her pain continued as it is right now or worsen, the patient and her daughter were instructed to call to set up vertebral body augmentations at L2, L4 and S1. Questions were answered to their satisfaction. Both the daughter and the  patient leave with good understanding and agreement with the above management plan. Electronically Signed   By: Luanne Bras M.D.   On: 05/20/2016 14:13    Assessment/Plan 1. Intractable pain  Reeducate nursing staff on the use of when necessary oxycodone in addition to scheduled  Methocarbamol 500 mg one half tablet by mouth 4 times a day scheduled  Continue Flector patch continue Tylenol 650 mg by mouth 4 times a day scheduled  2. Encounter for therapeutic drug monitoring  DC Coumadin  Begin Lovenox 40 mg subcutaneous daily for DVT prophylaxis  Continue Lovenox regimen until further instructed from surgeon  Monitor PT/INR to ensure levels are sub-therapeutic  Family/ staff Communication:   Total Time:  Documentation:10 minutes  Face to Face:20 minutes  Family/Phone:   Labs/tests ordered:  CBC, met B, PT/INR  Medication list reviewed and assessed for continued appropriateness.  Vikki Ports, NP-C Geriatrics [facility] [phone], [place] [address] Cell Phone (Mon-Fri 8am-5pm):  [phone] On Call:  [phone] & follow prompts after 5pm & weekends Office Phone:  [phone] Office Fax:  [phone]

## 2016-06-08 NOTE — Progress Notes (Signed)
Location:      Place of Service:  SNF (31) Provider:  Toni Arthurs, NP-C  Rusty Aus, MD  Patient Care Team: Rusty Aus, MD as PCP - General (Internal Medicine) Robert Bellow, MD as Consulting Physician (General Surgery)  Extended Emergency Contact Information Primary Emergency Contact: [name],[name] Address: [address]          [place], [place] [address] [name] of [place] Phone: [phone] Relation: Daughter Secondary Emergency Contact: [name] [place] of [place] Mobile Phone: [phone] Relation: Son  Code Status:  DO NOT RESUSCITATE Goals of care: Advanced Directive information Advanced Directives 04/10/2016  Does Patient Have a Medical Advance Directive? No  Would patient like information on creating a medical advance directive? No - patient declined information     Chief Complaint  Patient presents with  . Acute Visit    HPI:  Pt is a 81 y.o. female seen today for an acute visit for c/o intractable pain in her back r/t the compression fractures. She was on Oxycodone, but that made her too sleepy. Pt requested something more mild. This does not appear to be working. The pain is inhibiting progress with PT and inhibiting sleep. Average 8/10. Patient had an order for a Flector patch. However, patient developed hyponatremia. Any medications, including NSAIDS of any form, that could potentially cause hyponatremia was removed from her profile. Hyponatremia has since stabilized. Consider adding the patch back for improved pain control. Also, patient visited the wound care clinic yesterday for replacement of her chronic Unna boots used for peripheral venous insufficiency with vascular ulcer. Patient reports staff at the wound clinic used a cream on the ulcers that contains silver, which she has an allergy to. The wound clinic call the facility this morning with orders to remove the Unna boots and assessed for allergic reaction. Unna boots  were removed, with no signs of adverse reaction. Will have nursing update the wound clinic and obtain further orders. Vital signs stable. No other complaints.  Past Medical History:  Diagnosis Date  . Arthritis    "back, hands, knees" (04/10/2016)  . Chronic atrial fibrillation ([place])    a. On Coumadin  . Chronic back pain   . Chronic combined systolic (congestive) and diastolic (congestive) heart failure    a. EF 25% by echo in 08/2015 b. RHC in 08/2015 showed normal filling pressures  . Compression fracture    "several; all in my back" (04/10/2016)  . GERD (gastroesophageal reflux disease)   . Heart murmur   . Hypertension   . S/P MVR (mitral valve replacement)    a. MVR 1994 b. redo MVR in 08/2014 - on Coumadin  . Shortness of breath dyspnea    Past Surgical History:  Procedure Laterality Date  . BREAST LUMPECTOMY Right 2005?   benign lump excision  . CARDIAC CATHETERIZATION    . [place], 2016   "MVR; MVR"  . CATARACT EXTRACTION W/PHACO Right 02/20/2015   Procedure: CATARACT EXTRACTION PHACO AND INTRAOCULAR LENS PLACEMENT (IOC);  Surgeon: Estill Cotta, MD;  Location: [facility] ORS;  Service: Ophthalmology;  Laterality: Right;  US01:31.2 AP 25.3% CDE40.13 Fluid lot # [id number]  . ELECTROPHYSIOLOGIC STUDY N/A 10/09/2015   Procedure: CARDIOVERSION;  Surgeon: Wellington Hampshire, MD;  Location: [facility] ORS;  Service: Cardiovascular;  Laterality: N/A;  . IR GENERIC HISTORICAL  04/01/2016   IR RADIOLOGIST EVAL & MGMT 04/01/2016 [facility]  . IR GENERIC HISTORICAL  04/15/2016   IR VERTEBROPLASTY  CERV/THOR BX INC UNI/BIL INC/INJECT/IMAGING 04/15/2016 Luanne Bras, MD [facility]  . IR GENERIC HISTORICAL  04/15/2016   IR VERTEBROPLASTY EA ADDL (T&LS) BX INC UNI/BIL INC INJECT/IMAGING 04/15/2016 Luanne Bras, MD [facility]  . IR GENERIC HISTORICAL  04/15/2016   IR VERTEBROPLASTY EA ADDL (T&LS) BX INC UNI/BIL INC INJECT/IMAGING 04/15/2016 Luanne Bras, MD [facility]  . IR GENERIC HISTORICAL  05/20/2016   IR RADIOLOGIST EVAL & MGMT 05/20/2016 [facility]  . IRRIGATION AND DEBRIDEMENT HEMATOMA Left 07/05/2015   Procedure: IRRIGATION AND DEBRIDEMENT HEMATOMA;  Surgeon: Robert Bellow, MD;  Location: [facility] ORS;  Service: General;  Laterality: Left;  . pyloric stenosis  07/1937  . US ECHOCARDIOGRAPHY      Allergies  Allergen Reactions  . Ace Inhibitors     Other reaction(s): Cough  . Hydrocodone Itching and Nausea Only  . Mercury     Other reaction(s): Unknown  . Nickel   . Silver Dermatitis    Severe itching  . Cephalexin Rash  . Clindamycin/Lincomycin Rash  . Penicillins Rash    Has patient had a PCN reaction causing immediate rash, facial/tongue/throat swelling, SOB or lightheadedness with hypotension: YES Has patient had a PCN reaction causing severe rash involving mucus membranes or skin necrosis: NO Has patient had a PCN reaction that required hospitalization NO Has patient had a PCN reaction occurring within the last 10 years: NO If all of the above answers are "NO", then may proceed with Cephalosporin use.      Medication List       Accurate as of 05/29/16 11:59 PM. Always use your most recent med list.          acetaminophen 500 MG tablet Commonly known as:  TYLENOL Take 500 mg by mouth every 6 (six) hours as needed for mild pain, moderate pain, fever or headache.   aspirin EC 81 MG tablet Take 81 mg by mouth daily.   feeding supplement (ENSURE ENLIVE) Liqd Take 237 mLs by mouth 2 (two) times daily between meals.   furosemide 40 MG tablet Commonly known as:  LASIX Take 40 mg by mouth 2 (two) times daily.   liver oil-zinc oxide 40 % ointment Commonly known as:  DESITIN Apply 1 application topically 3 (three) times daily. Apply to bed sore   metoprolol succinate 25 MG 24 hr tablet Commonly known as:  TOPROL-XL Take 1 tablet (25 mg total) by mouth daily.   oxyCODONE 5 MG immediate release  tablet Commonly known as:  Oxy IR/ROXICODONE Take 1 tablet (5 mg total) by mouth every 4 (four) hours.   pantoprazole 40 MG tablet Commonly known as:  PROTONIX Take 40 mg by mouth daily.   polyethylene glycol packet Commonly known as:  MIRALAX / GLYCOLAX Take 17 g by mouth 2 (two) times daily.   senna-docusate 8.6-50 MG tablet Commonly known as:  Senokot-S Take 1 tablet by mouth 2 (two) times daily.   spironolactone 25 MG tablet Commonly known as:  ALDACTONE Take 25 mg by mouth 2 (two) times daily.   warfarin 5 MG tablet Commonly known as:  COUMADIN Take 1 tablet (5 mg total) by mouth daily.       Review of Systems  Constitutional: Negative for activity change, appetite change, chills, diaphoresis and fever.  Respiratory: Negative for apnea, cough, choking, chest tightness, shortness of breath and wheezing.   Cardiovascular: Negative for chest pain, palpitations and leg swelling.  Gastrointestinal: Negative for abdominal distention, abdominal pain, anal bleeding, blood in  stool, constipation, diarrhea and nausea.  Genitourinary: Negative for difficulty urinating, dysuria, frequency, hematuria, urgency and vaginal bleeding.  Musculoskeletal: Positive for arthralgias (typical arthritis), back pain and myalgias. Negative for gait problem.  Skin: Positive for wound. Negative for color change, pallor and rash.  Neurological: Negative for dizziness, tremors, syncope, speech difficulty, weakness, numbness and headaches.  All other systems reviewed and are negative.    There is no immunization history on file for this patient. Pertinent  Health Maintenance Due  Topic Date Due  . DEXA SCAN  06/28/2002  . PNA vac Low Risk Adult (1 of 2 - PCV13) 06/28/2002  . INFLUENZA VACCINE  01/30/2016   Fall Risk  09/25/2015  Falls in the past year? No   Functional Status Survey:    Vitals:   05/29/16 0630  BP: (!) 149/72  Resp: (!) 75  Temp: 98.1 F (36.7 C)  SpO2: 90%  Weight:  146 lb 14.4 oz (66.6 kg)   Body mass index is 23.71 kg/m. Physical Exam  Constitutional: She is oriented to person, place, and time. Vital signs are normal. She appears well-developed and well-nourished. She is active and cooperative. She does not appear ill. No distress.  HENT:  Head: Normocephalic and atraumatic.  Mouth/Throat: Uvula is midline, oropharynx is clear and moist and mucous membranes are normal. Mucous membranes are not pale, not dry and not cyanotic.  Eyes: Conjunctivae, EOM and lids are normal. Pupils are equal, round, and reactive to light.  Neck: Trachea normal, normal range of motion and full passive range of motion without pain. Neck supple. No JVD present. No tracheal deviation, no edema and no erythema present. No thyromegaly present.  Cardiovascular: Normal rate, regular rhythm, normal heart sounds, intact distal pulses and normal pulses.  Exam reveals no gallop, no distant heart sounds and no friction rub.   No murmur heard. Pulmonary/Chest: Effort normal and breath sounds normal. No accessory muscle usage. No respiratory distress. She has no wheezes. She has no rales. She exhibits no tenderness.  Abdominal: Normal appearance and bowel sounds are normal. She exhibits no distension and no ascites. There is no tenderness.  Musculoskeletal: She exhibits no edema.       Lumbar back: She exhibits decreased range of motion (multiple compression fractures), tenderness, bony tenderness and pain.  Expected osteoarthritis, stiffness  Neurological: She is alert and oriented to person, place, and time. She has normal strength.  Skin: Skin is warm, dry and intact. Lesion (small, 2 cm diameter vascular wound with eschar on the posterior right lower leg with small 1 cm diameter satellite lesion proximal. No drainage or erythema) noted. No rash noted. She is not diaphoretic. No cyanosis or erythema. No pallor. Nails show no clubbing.  Psychiatric: She has a normal mood and affect. Her  speech is normal and behavior is normal. Judgment and thought content normal. Cognition and memory are normal.  Nursing note and vitals reviewed.   Labs reviewed:  Recent Labs  06/03/16 0540 06/05/16 0645 06/07/16 0641  NA 136 137 135  K 4.4 4.2 4.2  CL 100* 100* 98*  CO2 28 29 29   GLUCOSE 78 69 84  BUN 19 22* 20  CREATININE 1.30* 1.36* 1.17*  CALCIUM 9.6 9.6 9.5    Recent Labs  05/20/16 0435 05/21/16 1625 06/01/16 0500  AST 24 25 18   ALT 18 19 16   ALKPHOS 122 151* 129*  BILITOT 0.9 0.6 1.0  PROT 7.0 7.5 6.7  ALBUMIN 3.9 4.3 3.5  Recent Labs  05/26/16 0739  06/03/16 0540 06/05/16 0645 06/07/16 0641  WBC 4.0  < > 3.7 3.2* 4.6  NEUTROABS 2.6  --  2.2 1.6  --   HGB 12.4  < > 12.7 12.3 13.3  HCT 37.9  < > 38.5 37.3 39.7  MCV 85.7  < > 85.8 84.6 83.6  PLT 181  < > 222 233 277  < > = values in this interval not displayed. Lab Results  Component Value Date   TSH 2.239 04/13/2016   No results found for: HGBA1C No results found for: CHOL, HDL, LDLCALC, LDLDIRECT, TRIG, CHOLHDL  Significant Diagnostic Results in last 30 days:  Ir Radiologist Eval & Mgmt  Result Date: 05/21/2016 EXAM: ESTABLISHED PATIENT OFFICE VISIT CHIEF COMPLAINT: The patient is a 81 year old right-handed lady who presents accompanied by her daughter in follow-up to a vertebral body augmentation at T6-T7 and L5 approximately 2 weeks ago. According to the patient and also her daughter, the main improvement has been noted since the procedures has been improved mobility with a walker with less pain on standing. Otherwise, the patient reports no significant change in her pain overall. She continues to have low back pain which is almost constant and radiates into the iliac regions. The pain does not radiate in a radicular manner into the lower extremities. She has no autonomic dysfunction of her bowel or bladder activity. With the oxycodone, her pain is controlled to a level 5 out of 10 in terms of  intensity. Since her visitation the last time the patient reports significantly improved appetite. She completes 3 meals a day. Her weight has been steady and slightly increased to 137 pounds. She used to be at least 158 pounds prior to the development of her fractures. She denies any recent chills, fever or rigors. She denies any UTI symptoms of dysuria, polyuria or of hematuria. She denies recent chest pain or shortness of breath. Recently she had a traumatic hematoma in one of her feet which was evacuated and is in the process of healing. Current Pain Level: 1-10 HISTORY OF PRESENT ILLNESS: As above. Past medical history, family history and social history as per previously unchanged. She is still at [facility] where she undergoes ambulation. Present medications: Acetaminophen, aspirin, Ensure, furosemide, liver oil-zinc oxide, metoprolol, oxycodone every 6 hours prn, Protonix, MiraLax, Senokot, spironolactone, and warfarin. Allergies: Ace inhibitors, hydrocodone, mercury, nickel, silver, cephalexin, clindamycin, lincomycin and penicillins. PHYSICAL EXAMINATION: In mild to moderate distress on account of her pain. Patient sitting in a wheelchair slouched forward. Neurologically intact completely. The patient demonstrating no evidence of tenderness in the thoracic and lower thoracic regions. However, moderately severely tender in the lower lumbar regions at L2-L4. ASSESSMENT AND PLAN: Previous MRI scan of the thoracolumbar spine was reviewed with her and her daughter. The L2-L4 levels had areas of signal abnormality suggestive of subacute compression fractures. Also noted was abnormal signal in the sacral ala more prominent on the right-side. The option of performing vertebral body augmentation at L2, L4 and the right-side of the sacrum was again discussed with the patient and the patient's daughter. The patient wants to see how much more of her physical activity that she can tolerate with the  present pain and pain medications. To this affect, it was decided to continue with the conservative management with gradually improving her level of activity as tolerated in rehab. Should her pain continued as it is right now or worsen, the patient and her daughter were  instructed to call to set up vertebral body augmentations at L2, L4 and S1. Questions were answered to their satisfaction. Both the daughter and the patient leave with good understanding and agreement with the above management plan. Electronically Signed   By: Luanne Bras M.D.   On: 05/20/2016 14:13    Assessment/Plan 1. Peripheral venous insufficiency  Remove the Unna boots that were placed at the wound care center yesterday due to potential for allergic reaction to medication used that contained silver  Have nursing call wound care center to clarify new orders for applying new Unna boot without the medication that could potentially cause allergic reaction  2. Intractable pain  Restart Flector patch. One patch to area of pain every 12 hours  Family/ staff Communication:   Total Time:  Documentation:  Face to Face:  Family/Phone:   Labs/tests ordered:  CBC, met B, PT/INR  Medication list reviewed and assessed for continued appropriateness.  Vikki Ports, NP-C Geriatrics [facility] [phone], [place] [address] Cell Phone (Mon-Fri 8am-5pm):  [phone] On Call:  [phone] & follow prompts after 5pm & weekends Office Phone:  [phone] Office Fax:  [phone]

## 2016-06-09 DIAGNOSIS — N189 Chronic kidney disease, unspecified: Secondary | ICD-10-CM | POA: Diagnosis present

## 2016-06-09 DIAGNOSIS — I4891 Unspecified atrial fibrillation: Secondary | ICD-10-CM | POA: Diagnosis present

## 2016-06-09 LAB — PROTIME-INR
INR: 1.98
Prothrombin Time: 22.8 seconds — ABNORMAL HIGH (ref 11.4–15.2)

## 2016-06-09 LAB — BASIC METABOLIC PANEL
Anion gap: 7 (ref 5–15)
BUN: 20 mg/dL (ref 6–20)
CO2: 28 mmol/L (ref 22–32)
CREATININE: 1.24 mg/dL — AB (ref 0.44–1.00)
Calcium: 9.6 mg/dL (ref 8.9–10.3)
Chloride: 101 mmol/L (ref 101–111)
GFR calc Af Amer: 47 mL/min — ABNORMAL LOW (ref >60)
GFR, EST NON AFRICAN AMERICAN: 41 mL/min — AB (ref >60)
GLUCOSE: 84 mg/dL (ref 65–99)
POTASSIUM: 4.3 mmol/L (ref 3.5–5.1)
Sodium: 136 mmol/L (ref 135–145)

## 2016-06-09 LAB — CBC WITH DIFFERENTIAL/PLATELET
BASOS ABS: 0.1 10*3/uL (ref 0–0.1)
BASOS PCT: 2 %
EOS ABS: 0.1 10*3/uL (ref 0–0.7)
EOS PCT: 4 %
HEMATOCRIT: 38.9 % (ref 35.0–47.0)
Hemoglobin: 13 g/dL (ref 12.0–16.0)
Lymphocytes Relative: 27 %
Lymphs Abs: 1 10*3/uL (ref 1.0–3.6)
MCH: 27.8 pg (ref 26.0–34.0)
MCHC: 33.3 g/dL (ref 32.0–36.0)
MCV: 83.5 fL (ref 80.0–100.0)
MONO ABS: 0.7 10*3/uL (ref 0.2–0.9)
MONOS PCT: 18 %
NEUTROS ABS: 1.8 10*3/uL (ref 1.4–6.5)
Neutrophils Relative %: 49 %
PLATELETS: 227 10*3/uL (ref 150–440)
RBC: 4.66 MIL/uL (ref 3.80–5.20)
RDW: 18 % — AB (ref 11.5–14.5)
WBC: 3.7 10*3/uL (ref 3.6–11.0)

## 2016-06-10 ENCOUNTER — Other Ambulatory Visit: Payer: Self-pay | Admitting: Radiology

## 2016-06-11 DIAGNOSIS — N189 Chronic kidney disease, unspecified: Secondary | ICD-10-CM | POA: Diagnosis not present

## 2016-06-11 LAB — CBC WITH DIFFERENTIAL/PLATELET
BASOS ABS: 0.1 10*3/uL (ref 0–0.1)
BASOS PCT: 1 %
Eosinophils Absolute: 0.1 10*3/uL (ref 0–0.7)
Eosinophils Relative: 3 %
HEMATOCRIT: 39.8 % (ref 35.0–47.0)
HEMOGLOBIN: 13.2 g/dL (ref 12.0–16.0)
LYMPHS PCT: 22 %
Lymphs Abs: 1 10*3/uL (ref 1.0–3.6)
MCH: 27.5 pg (ref 26.0–34.0)
MCHC: 33.2 g/dL (ref 32.0–36.0)
MCV: 83 fL (ref 80.0–100.0)
Monocytes Absolute: 0.7 10*3/uL (ref 0.2–0.9)
Monocytes Relative: 15 %
NEUTROS ABS: 2.7 10*3/uL (ref 1.4–6.5)
NEUTROS PCT: 59 %
Platelets: 247 10*3/uL (ref 150–440)
RBC: 4.79 MIL/uL (ref 3.80–5.20)
RDW: 17.5 % — AB (ref 11.5–14.5)
WBC: 4.5 10*3/uL (ref 3.6–11.0)

## 2016-06-11 LAB — BASIC METABOLIC PANEL
ANION GAP: 8 (ref 5–15)
BUN: 20 mg/dL (ref 6–20)
CO2: 27 mmol/L (ref 22–32)
Calcium: 9.9 mg/dL (ref 8.9–10.3)
Chloride: 100 mmol/L — ABNORMAL LOW (ref 101–111)
Creatinine, Ser: 1.34 mg/dL — ABNORMAL HIGH (ref 0.44–1.00)
GFR, EST AFRICAN AMERICAN: 43 mL/min — AB (ref >60)
GFR, EST NON AFRICAN AMERICAN: 37 mL/min — AB (ref >60)
Glucose, Bld: 91 mg/dL (ref 65–99)
POTASSIUM: 4.5 mmol/L (ref 3.5–5.1)
SODIUM: 135 mmol/L (ref 135–145)

## 2016-06-11 LAB — PROTIME-INR
INR: 1.16
Prothrombin Time: 14.9 seconds (ref 11.4–15.2)

## 2016-06-12 ENCOUNTER — Encounter: Payer: Medicare Other | Admitting: Internal Medicine

## 2016-06-12 ENCOUNTER — Other Ambulatory Visit: Payer: Self-pay | Admitting: Radiology

## 2016-06-12 ENCOUNTER — Telehealth: Payer: Self-pay | Admitting: *Deleted

## 2016-06-12 DIAGNOSIS — I4891 Unspecified atrial fibrillation: Secondary | ICD-10-CM | POA: Diagnosis not present

## 2016-06-12 DIAGNOSIS — Z79899 Other long term (current) drug therapy: Secondary | ICD-10-CM | POA: Diagnosis not present

## 2016-06-12 DIAGNOSIS — Z7901 Long term (current) use of anticoagulants: Secondary | ICD-10-CM | POA: Diagnosis not present

## 2016-06-12 DIAGNOSIS — I5022 Chronic systolic (congestive) heart failure: Secondary | ICD-10-CM | POA: Diagnosis not present

## 2016-06-12 DIAGNOSIS — I87333 Chronic venous hypertension (idiopathic) with ulcer and inflammation of bilateral lower extremity: Secondary | ICD-10-CM | POA: Diagnosis present

## 2016-06-12 DIAGNOSIS — Z952 Presence of prosthetic heart valve: Secondary | ICD-10-CM | POA: Diagnosis not present

## 2016-06-12 DIAGNOSIS — I890 Lymphedema, not elsewhere classified: Secondary | ICD-10-CM | POA: Diagnosis not present

## 2016-06-12 DIAGNOSIS — L97812 Non-pressure chronic ulcer of other part of right lower leg with fat layer exposed: Secondary | ICD-10-CM | POA: Diagnosis not present

## 2016-06-12 NOTE — Telephone Encounter (Signed)
NOTES SENT TO SCHEDULING ON 06/12/16.

## 2016-06-13 ENCOUNTER — Other Ambulatory Visit (HOSPITAL_COMMUNITY): Payer: Self-pay | Admitting: Interventional Radiology

## 2016-06-13 ENCOUNTER — Encounter (HOSPITAL_COMMUNITY): Payer: Self-pay

## 2016-06-13 ENCOUNTER — Ambulatory Visit (HOSPITAL_COMMUNITY)
Admission: RE | Admit: 2016-06-13 | Discharge: 2016-06-13 | Disposition: A | Discharge status: Home / Self Care | Payer: Medicare Other | Source: Ambulatory Visit | Attending: Interventional Radiology | Admitting: Interventional Radiology

## 2016-06-13 DIAGNOSIS — Z87891 Personal history of nicotine dependence: Secondary | ICD-10-CM | POA: Diagnosis not present

## 2016-06-13 DIAGNOSIS — M8448XA Pathological fracture, other site, initial encounter for fracture: Secondary | ICD-10-CM | POA: Insufficient documentation

## 2016-06-13 DIAGNOSIS — S32029A Unspecified fracture of second lumbar vertebra, initial encounter for closed fracture: Secondary | ICD-10-CM | POA: Diagnosis not present

## 2016-06-13 DIAGNOSIS — Z7901 Long term (current) use of anticoagulants: Secondary | ICD-10-CM | POA: Diagnosis not present

## 2016-06-13 DIAGNOSIS — S32049A Unspecified fracture of fourth lumbar vertebra, initial encounter for closed fracture: Secondary | ICD-10-CM | POA: Insufficient documentation

## 2016-06-13 DIAGNOSIS — S32000A Wedge compression fracture of unspecified lumbar vertebra, initial encounter for closed fracture: Secondary | ICD-10-CM

## 2016-06-13 DIAGNOSIS — I482 Chronic atrial fibrillation: Secondary | ICD-10-CM | POA: Insufficient documentation

## 2016-06-13 DIAGNOSIS — Z79891 Long term (current) use of opiate analgesic: Secondary | ICD-10-CM | POA: Insufficient documentation

## 2016-06-13 DIAGNOSIS — I110 Hypertensive heart disease with heart failure: Secondary | ICD-10-CM | POA: Insufficient documentation

## 2016-06-13 DIAGNOSIS — X58XXXA Exposure to other specified factors, initial encounter: Secondary | ICD-10-CM | POA: Insufficient documentation

## 2016-06-13 DIAGNOSIS — K219 Gastro-esophageal reflux disease without esophagitis: Secondary | ICD-10-CM | POA: Diagnosis not present

## 2016-06-13 DIAGNOSIS — Z79899 Other long term (current) drug therapy: Secondary | ICD-10-CM | POA: Insufficient documentation

## 2016-06-13 DIAGNOSIS — N189 Chronic kidney disease, unspecified: Secondary | ICD-10-CM | POA: Diagnosis not present

## 2016-06-13 DIAGNOSIS — I5042 Chronic combined systolic (congestive) and diastolic (congestive) heart failure: Secondary | ICD-10-CM | POA: Diagnosis not present

## 2016-06-13 DIAGNOSIS — Z7982 Long term (current) use of aspirin: Secondary | ICD-10-CM | POA: Insufficient documentation

## 2016-06-13 DIAGNOSIS — Z952 Presence of prosthetic heart valve: Secondary | ICD-10-CM | POA: Diagnosis not present

## 2016-06-13 HISTORY — PX: IR GENERIC HISTORICAL: IMG1180011

## 2016-06-13 LAB — BASIC METABOLIC PANEL
Anion gap: 8 (ref 5–15)
Anion gap: 10 (ref 5–15)
BUN: 22 mg/dL — AB (ref 6–20)
BUN: 18 mg/dL (ref 6–20)
CALCIUM: 10.4 mg/dL — AB (ref 8.9–10.3)
CO2: 27 mmol/L (ref 22–32)
CO2: 26 mmol/L (ref 22–32)
CREATININE: 1.39 mg/dL — AB (ref 0.44–1.00)
CREATININE: 1.41 mg/dL — AB (ref 0.44–1.00)
Calcium: 9.9 mg/dL (ref 8.9–10.3)
Chloride: 103 mmol/L (ref 101–111)
Chloride: 102 mmol/L (ref 101–111)
GFR calc Af Amer: 40 mL/min — ABNORMAL LOW (ref >60)
GFR, EST AFRICAN AMERICAN: 41 mL/min — AB (ref >60)
GFR, EST NON AFRICAN AMERICAN: 35 mL/min — AB (ref >60)
GFR, EST NON AFRICAN AMERICAN: 35 mL/min — AB (ref >60)
Glucose, Bld: 88 mg/dL (ref 65–99)
Glucose, Bld: 99 mg/dL (ref 65–99)
Potassium: 4.3 mmol/L (ref 3.5–5.1)
Potassium: 4.9 mmol/L (ref 3.5–5.1)
SODIUM: 138 mmol/L (ref 135–145)
SODIUM: 138 mmol/L (ref 135–145)

## 2016-06-13 LAB — CBC WITH DIFFERENTIAL/PLATELET
BASOS ABS: 0.1 10*3/uL (ref 0–0.1)
BASOS ABS: 0.1 10*3/uL (ref 0.0–0.1)
BASOS PCT: 2 %
BASOS PCT: 1 %
EOS ABS: 0.1 10*3/uL (ref 0.0–0.7)
EOS PCT: 1 %
Eosinophils Absolute: 0.1 10*3/uL (ref 0–0.7)
Eosinophils Relative: 3 %
HCT: 42.5 % (ref 36.0–46.0)
HEMATOCRIT: 39.3 % (ref 35.0–47.0)
Hemoglobin: 12.9 g/dL (ref 12.0–16.0)
Hemoglobin: 13.6 g/dL (ref 12.0–15.0)
Lymphocytes Relative: 23 %
Lymphocytes Relative: 14 %
Lymphs Abs: 0.9 10*3/uL — ABNORMAL LOW (ref 1.0–3.6)
Lymphs Abs: 0.9 10*3/uL (ref 0.7–4.0)
MCH: 27.5 pg (ref 26.0–34.0)
MCH: 27.4 pg (ref 26.0–34.0)
MCHC: 32.9 g/dL (ref 32.0–36.0)
MCHC: 32 g/dL (ref 30.0–36.0)
MCV: 83.5 fL (ref 80.0–100.0)
MCV: 85.7 fL (ref 78.0–100.0)
MONO ABS: 0.7 10*3/uL (ref 0.2–0.9)
Monocytes Absolute: 0.7 10*3/uL (ref 0.1–1.0)
Monocytes Relative: 17 %
Monocytes Relative: 11 %
NEUTROS ABS: 2.3 10*3/uL (ref 1.4–6.5)
Neutro Abs: 4.7 10*3/uL (ref 1.7–7.7)
Neutrophils Relative %: 55 %
Neutrophils Relative %: 73 %
PLATELETS: 236 10*3/uL (ref 150–440)
PLATELETS: 225 10*3/uL (ref 150–400)
RBC: 4.7 MIL/uL (ref 3.80–5.20)
RBC: 4.96 MIL/uL (ref 3.87–5.11)
RDW: 17.2 % — AB (ref 11.5–14.5)
RDW: 16.4 % — ABNORMAL HIGH (ref 11.5–15.5)
WBC: 4.2 10*3/uL (ref 3.6–11.0)
WBC: 6.5 10*3/uL (ref 4.0–10.5)

## 2016-06-13 LAB — PROTIME-INR
INR: 0.96
INR: 0.94
PROTHROMBIN TIME: 12.6 s (ref 11.4–15.2)
Prothrombin Time: 12.8 seconds (ref 11.4–15.2)

## 2016-06-13 MED ORDER — OXYCODONE HCL 5 MG PO TABS
5.0000 mg | ORAL_TABLET | ORAL | Status: DC | PRN
  Administered 2016-06-13: 5 mg via ORAL
  Filled 2016-06-13: qty 1

## 2016-06-13 MED ORDER — OXYCODONE HCL 5 MG PO TABS
ORAL_TABLET | ORAL | Status: AC
  Administered 2016-06-13: 5 mg via ORAL
  Filled 2016-06-13: qty 1

## 2016-06-13 MED ORDER — GELATIN ABSORBABLE 12-7 MM EX MISC
CUTANEOUS | Status: AC
  Administered 2016-06-13: 1 via SURGICAL_CAVITY
  Filled 2016-06-13: qty 1

## 2016-06-13 MED ORDER — HYDROMORPHONE HCL 1 MG/ML IJ SOLN
INTRAMUSCULAR | Status: DC
Start: 2016-06-13 — End: 2016-06-13
  Filled 2016-06-13: qty 1

## 2016-06-13 MED ORDER — FENTANYL CITRATE (PF) 100 MCG/2ML IJ SOLN
INTRAMUSCULAR | Status: AC
  Filled 2016-06-13: qty 2

## 2016-06-13 MED ORDER — MIDAZOLAM HCL 2 MG/2ML IJ SOLN
INTRAMUSCULAR | Status: AC | PRN
  Administered 2016-06-13: 0.5 mg via INTRAVENOUS
  Administered 2016-06-13: 1 mg via INTRAVENOUS

## 2016-06-13 MED ORDER — VANCOMYCIN HCL IN DEXTROSE 1-5 GM/200ML-% IV SOLN
1000.0000 mg | INTRAVENOUS | Status: AC
  Administered 2016-06-13: 1000 mg via INTRAVENOUS

## 2016-06-13 MED ORDER — ACETAMINOPHEN 325 MG PO TABS
650.0000 mg | ORAL_TABLET | Freq: Once | ORAL | Status: AC
  Administered 2016-06-13: 650 mg via ORAL

## 2016-06-13 MED ORDER — FENTANYL CITRATE (PF) 100 MCG/2ML IJ SOLN
25.0000 ug | Freq: Once | INTRAMUSCULAR | Status: AC
  Administered 2016-06-13: 25 ug via INTRAVENOUS

## 2016-06-13 MED ORDER — SODIUM CHLORIDE 0.9 % IV SOLN
INTRAVENOUS | Status: DC
  Administered 2016-06-13 (×2): via INTRAVENOUS

## 2016-06-13 MED ORDER — TOBRAMYCIN SULFATE 1.2 G IJ SOLR
INTRAMUSCULAR | Status: AC
  Filled 2016-06-13: qty 1.2

## 2016-06-13 MED ORDER — ACETAMINOPHEN 325 MG PO TABS
ORAL_TABLET | ORAL | Status: AC
  Administered 2016-06-13: 650 mg via ORAL
  Filled 2016-06-13: qty 2

## 2016-06-13 MED ORDER — MIDAZOLAM HCL 2 MG/2ML IJ SOLN
INTRAMUSCULAR | Status: AC
  Filled 2016-06-13: qty 4

## 2016-06-13 MED ORDER — VANCOMYCIN HCL IN DEXTROSE 1-5 GM/200ML-% IV SOLN: 1000.0000 mg | INTRAVENOUS | Status: DC

## 2016-06-13 MED ORDER — BUPIVACAINE HCL (PF) 0.25 % IJ SOLN
INTRAMUSCULAR | Status: AC
  Filled 2016-06-13: qty 60

## 2016-06-13 MED ORDER — FENTANYL CITRATE (PF) 100 MCG/2ML IJ SOLN
INTRAMUSCULAR | Status: AC | PRN
  Administered 2016-06-13 (×4): 25 ug via INTRAVENOUS

## 2016-06-13 MED ORDER — VANCOMYCIN HCL IN DEXTROSE 1-5 GM/200ML-% IV SOLN
INTRAVENOUS | Status: AC
  Administered 2016-06-13: 1000 mg via INTRAVENOUS
  Filled 2016-06-13: qty 200

## 2016-06-13 MED ORDER — SODIUM CHLORIDE 0.9 % IV SOLN: INTRAVENOUS | Status: AC

## 2016-06-13 MED ORDER — FENTANYL CITRATE (PF) 100 MCG/2ML IJ SOLN
INTRAMUSCULAR | Status: AC
  Filled 2016-06-13: qty 4

## 2016-06-13 MED ORDER — IOPAMIDOL (ISOVUE-300) INJECTION 61%
INTRAVENOUS | Status: AC
  Administered 2016-06-13: 50 mL
  Filled 2016-06-13: qty 50

## 2016-06-13 NOTE — Sedation Documentation (Signed)
Patient is resting comfortably. 

## 2016-06-13 NOTE — Sedation Documentation (Signed)
3 dsg to back intact, dry

## 2016-06-13 NOTE — Progress Notes (Signed)
Report called to pt's nurse At [facility] left with daughter with tolerable pain level she states has improved since procedure and stable VS. Discharge instructions given to pts daughter also verbally and in writing.

## 2016-06-13 NOTE — Progress Notes (Signed)
[name] ([id number]) Visit Report for 06/12/2016 Arrival Information Details Patient Name: [name], [name]. Date of Service: 06/12/2016 10:30 AM Medical Record Patient Account Number: [id number] Number: Treating RN: [name] 09/30/1934 (81 y.o. Other Clinician: Date of Birth/Sex: Female) Treating [name], [name] Primary Care Physician/Extender: Claudette Laws Physician: Referring Physician: [name] in Treatment: 29 Visit Information History Since Last Visit All ordered tests and consults were completed: No Patient Arrived: Wheel Chair Added or deleted any medications: No Arrival Time: 11:12 Any new allergies or adverse reactions: No Accompanied By: caregiver Had a fall or experienced change in No Transfer Assistance: EasyPivot Patient activities of daily living that may affect Lift risk of falls: Patient Identification Verified: Yes Signs or symptoms of abuse/neglect since last No Secondary Verification Process Yes visito Completed: Hospitalized since last visit: No Patient Requires Transmission- No Has Dressing in Place as Prescribed: Yes Based Precautions: Has Compression in Place as Prescribed: Yes Patient Has Alerts: Yes Pain Present Now: No Patient Alerts: Patient on Blood Thinner [name] Signature(s) Signed: 06/12/2016 1:16:38 PM By: [name] Entered By: [name] on 06/12/2016 11:12:48 [name] ([id number]) -------------------------------------------------------------------------------- Clinic Level of Care Assessment Details Patient Name: [name]. Date of Service: 06/12/2016 10:30 AM Medical Record Patient Account Number: [id number] Number: Treating RN: [name] [birth year] (81 y.o. Other Clinician: Date of Birth/Sex: Female) Treating [name], [name] Primary Care Physician/Extender: Claudette Laws Physician: Referring Physician: [name] in Treatment: 29 Clinic Level of Care Assessment Items TOOL 4 Quantity Score X - Use when only an EandM is performed on FOLLOW-UP visit 1 0 ASSESSMENTS - Nursing Assessment / Reassessment X - Reassessment of Co-morbidities (includes updates in patient status) 1 10 X - Reassessment of Adherence to Treatment Plan 1 5 ASSESSMENTS - Wound and Skin Assessment / Reassessment X - Simple Wound Assessment / Reassessment - one wound 1 5 []  - Complex Wound Assessment / Reassessment - multiple wounds 0 []  - Dermatologic / Skin Assessment (not related to wound area) 0 ASSESSMENTS - Focused Assessment X - Circumferential Edema Measurements - multi extremities 1 5 []  - Nutritional Assessment / Counseling / Intervention 0 []  - Lower Extremity Assessment (monofilament, tuning fork, pulses) 0 []  - Peripheral Arterial Disease Assessment (using hand held doppler) 0 ASSESSMENTS - Ostomy and/or Continence Assessment and Care []  - Incontinence Assessment and Management 0 []  - Ostomy Care Assessment and Management (repouching, etc.) 0 PROCESS - Coordination of Care []  - Simple Patient / Family Education for ongoing care 0 X - Complex (extensive) Patient / Family Education for ongoing care 1 20 X - Staff obtains Consents, Records, Test Results / Process Orders 1 10 [name], [name] ([id number]) X - Staff telephones HHA, Nursing Homes / Clarify orders / etc 1 10 []  - Routine Transfer to another Facility (non-emergent condition) 0 []  - Routine Hospital Admission (non-emergent condition) 0 []  - New Admissions / Biomedical engineer / Ordering NPWT, Apligraf, etc. 0 []  - Emergency Hospital Admission (emergent condition) 0 X - Simple Discharge Coordination 1 10 []  - Complex (extensive) Discharge Coordination 0 PROCESS - Special Needs []  - Pediatric / Minor Patient Management 0 []  - Isolation Patient Management 0 []  - Hearing / Language / Visual  special needs 0 []  - Assessment of Community assistance (transportation, D/C planning, etc.) 0 []  - Additional assistance / Altered mentation 0 []  - Support Surface(s) Assessment (bed, cushion, seat, etc.) 0 INTERVENTIONS - Wound Cleansing / Measurement []  -  Simple Wound Cleansing - one wound 0 X - Complex Wound Cleansing - multiple wounds 1 5 X - Wound Imaging (photographs - any number of wounds) 1 5 []  - Wound Tracing (instead of photographs) 0 X - Simple Wound Measurement - one wound 1 5 []  - Complex Wound Measurement - multiple wounds 0 INTERVENTIONS - Wound Dressings []  - Small Wound Dressing one or multiple wounds 0 []  - Medium Wound Dressing one or multiple wounds 0 X - Large Wound Dressing one or multiple wounds 1 20 X - Application of Medications - topical 1 5 []  - Application of Medications - injection 0 [name], [name] ([id number]) INTERVENTIONS - Miscellaneous []  - External ear exam 0 []  - Specimen Collection (cultures, biopsies, blood, body fluids, etc.) 0 []  - Specimen(s) / Culture(s) sent or taken to Lab for analysis 0 []  - Patient Transfer (multiple staff / Civil Service fast streamer / Similar devices) 0 []  - Simple Staple / Suture removal (25 or less) 0 []  - Complex Staple / Suture removal (26 or more) 0 []  - Hypo / Hyperglycemic Management (close monitor of Blood Glucose) 0 []  - Ankle / Brachial Index (ABI) - do not check if billed separately 0 X - Vital Signs 1 5 Has the patient been seen at the hospital within the last three years: Yes Total Score: 120 Level Of Care: New/Established - Level 4 Electronic Signature(s) Signed: 06/12/2016 1:16:38 PM By: [name] Entered By: [name] on 06/12/2016 12:18:13 [name] ([id number]) -------------------------------------------------------------------------------- Encounter Discharge Information Details Patient Name: Carrie Mulligan C. Date of Service: 06/12/2016 10:30 AM Medical Record Patient Account  Number: [id number] Number: Treating RN: [name] 01/23/1935 (81 y.o. Other Clinician: Date of Birth/Sex: Female) Treating [name], [name] Primary Care Physician/Extender: Claudette Laws Physician: Referring Physician: [name] in Treatment: 29 Encounter Discharge Information Items Discharge Pain Level: 0 Discharge Condition: Stable Ambulatory Status: Wheelchair Nursing Discharge Destination: Home Transportation: Other Accompanied By: caregiver Schedule Follow-up Appointment: Yes Medication Reconciliation completed Yes and provided to Patient/Care Jeremian Whitby: Clinical Summary of Care: Electronic Signature(s) Signed: 06/12/2016 1:16:38 PM By: [name] Previous Signature: 06/12/2016 11:43:29 AM Version By: Ruthine Dose Entered By: [name] on 06/12/2016 11:46:04 Basinski, [place]. ([id number]) -------------------------------------------------------------------------------- Lower Extremity Assessment Details Patient Name: Carrie Mulligan C. Date of Service: 06/12/2016 10:30 AM Medical Record Patient Account Number: [id number] Number: Treating RN: [name] 01/13/1935 (80 y.o. Other Clinician: Date of Birth/Sex: Female) Treating [name], [name] Primary Care Physician/Extender: Claudette Laws Physician: Referring Physician: [name] in Treatment: 29 Edema Assessment Assessed: [Left: No] [Right: No] E[Left: dema] [Right: :] Calf Left: Right: Point of Measurement: 34 cm From Medial Instep cm 30.5 cm Ankle Left: Right: Point of Measurement: 10 cm From Medial Instep cm 20.6 cm Vascular Assessment Pulses: Posterior Tibial Extremity colors, hair growth, and conditions: Extremity Color: [Right:Mottled] Temperature of Extremity: [Right:Warm] Capillary Refill: [Right:< 3 seconds] Toe Nail Assessment Left: Right: Thick: Yes Discolored: Yes Deformed: No Improper Length and Hygiene: Yes Electronic  Signature(s) Signed: 06/12/2016 1:16:38 PM By: [name] Entered By: [name] on 06/12/2016 11:23:35 Formica, [name] ([id number]) -------------------------------------------------------------------------------- Multi Wound Chart Details Patient Name: Carrie Mulligan C. Date of Service: 06/12/2016 10:30 AM Medical Record Patient Account Number: [id number] Number: Treating RN: [name] 04-30-1935 (80 y.o. Other Clinician: Date of Birth/Sex: Female) Treating [name], [name] Primary Care Physician/Extender: Claudette Laws Physician: Referring Physician: [name] in Treatment: 29 Vital Signs Height(in): 68 Pulse(bpm): 92 Weight(lbs): 178 Blood  Pressure 120/82 (mmHg): Body Mass Index(BMI): 27 Temperature(F): 97.5 Respiratory Rate 16 (breaths/min): Photos: [8:No Photos] [N/A:N/A] Wound Location: [8:Right Lower Leg - Medial] [N/A:N/A] Wounding Event: [8:Trauma] [N/A:N/A] Primary Etiology: [8:Venous Leg Ulcer] [N/A:N/A] Comorbid History: [8:Cataracts, Arrhythmia, Hypotension, Osteoarthritis] [N/A:N/A] Date Acquired: [8:04/06/2016] [N/A:N/A] Weeks of Treatment: [8:7] [N/A:N/A] Wound Status: [8:Open] [N/A:N/A] Measurements L x W x D 0.7x1x0.3 [N/A:N/A] (cm) Area (cm) : [8:0.55] [N/A:N/A] Volume (cm) : [8:0.165] [N/A:N/A] % Reduction in Area: [8:-150.00%] [N/A:N/A] % Reduction in Volume: -650.00% [N/A:N/A] Classification: [8:Partial Thickness] [N/A:N/A] Exudate Amount: [8:Large] [N/A:N/A] Exudate Type: [8:Serosanguineous] [N/A:N/A] Exudate Color: [8:red, brown] [N/A:N/A] Wound Margin: [8:Distinct, outline attached] [N/A:N/A] Granulation Amount: [8:Small (1-33%)] [N/A:N/A] Granulation Quality: [8:Red] [N/A:N/A] Necrotic Amount: [8:Large (67-100%)] [N/A:N/A] Necrotic Tissue: [8:Eschar, Adherent Slough] [N/A:N/A] Exposed Structures: [8:Fascia: No Fat: No] [N/A:N/A] Tendon: No Muscle: No Joint: No Bone: No Limited to  Skin Breakdown Epithelialization: None N/A N/A Periwound Skin Texture: Edema: Yes N/A N/A Periwound Skin Moist: Yes N/A N/A Moisture: Periwound Skin Color: Erythema: Yes N/A N/A Erythema Location: Circumferential N/A N/A Temperature: No Abnormality N/A N/A Tenderness on Yes N/A N/A Palpation: Wound Preparation: Ulcer Cleansing: Other: N/A N/A soap and water Topical Anesthetic Applied: Other: lidocaine 4% Treatment Notes Electronic Signature(s) Signed: 06/12/2016 1:16:38 PM By: [name] Entered By: [name] on 06/12/2016 11:27:44 Greggs, [name] ([id number]) -------------------------------------------------------------------------------- Multi-Disciplinary Care Plan Details Patient Name: [name]. Date of Service: 06/12/2016 10:30 AM Medical Record Patient Account Number: [id number] Number: Treating RN: [name] 07/08/1934 (80 y.o. Other Clinician: Date of Birth/Sex: Female) Treating [name], [name] Primary Care Physician/Extender: Claudette Laws Physician: Referring Physician: [name] in Treatment: 29 Active Inactive Orientation to the Wound Care Program Nursing Diagnoses: Knowledge deficit related to the wound healing center program Goals: Patient/caregiver will verbalize understanding of the [facility] Date Initiated: 11/22/2015 Goal Status: Active Interventions: Provide education on orientation to the wound center Notes: Venous Leg Ulcer Nursing Diagnoses: Knowledge deficit related to disease process and management Potential for venous Insuffiency (use before diagnosis confirmed) Goals: Patient will maintain optimal edema control Date Initiated: 11/22/2015 Goal Status: Active Patient/caregiver will verbalize understanding of disease process and disease management Date Initiated: 11/22/2015 Goal Status: Active Verify adequate tissue perfusion prior to therapeutic compression  application Date Initiated: 11/22/2015 Goal Status: Active Interventions: Assess peripheral edema status every visit. NASHIYA, DISBROW ([id number]) Compression as ordered Provide education on venous insufficiency Treatment Activities: Non-invasive vascular studies : 11/22/2015 Therapeutic compression applied : 11/22/2015 Notes: Wound/Skin Impairment Nursing Diagnoses: Impaired tissue integrity Knowledge deficit related to ulceration/compromised skin integrity Goals: Patient/caregiver will verbalize understanding of skin care regimen Date Initiated: 11/22/2015 Goal Status: Active Ulcer/skin breakdown will have a volume reduction of 30% by week 4 Date Initiated: 11/22/2015 Goal Status: Active Ulcer/skin breakdown will have a volume reduction of 50% by week 8 Date Initiated: 11/22/2015 Goal Status: Active Ulcer/skin breakdown will have a volume reduction of 80% by week 12 Date Initiated: 11/22/2015 Goal Status: Active Ulcer/skin breakdown will heal within 14 weeks Date Initiated: 11/22/2015 Goal Status: Active Interventions: Assess patient/caregiver ability to perform ulcer/skin care regimen upon admission and as needed Assess ulceration(s) every visit Provide education on ulcer and skin care Treatment Activities: Skin care regimen initiated : 11/22/2015 Topical wound management initiated : 11/22/2015 Notes: Electronic Signature(s) Signed: 06/12/2016 1:16:38 PM By: De Burrs, [name] ([id number]) Entered By: [name] on 06/12/2016 11:27:06 Rasco, [name] ([id number]) -------------------------------------------------------------------------------- Pain Assessment Details Patient Name: Carrie Mulligan C. Date of Service: 06/12/2016  10:30 AM Medical Record Patient Account Number: [id number] Number: Treating RN: [name] Nov 15, 1934 (80 y.o. Other Clinician: Date of Birth/Sex: Female) Treating [name], [name] Primary Care  Physician/Extender: Claudette Laws Physician: Referring Physician: [name] in Treatment: 29 Active Problems Location of Pain Severity and Description of Pain Patient Has Paino No Site Locations With Dressing Change: No Pain Management and Medication Current Pain Management: Electronic Signature(s) Signed: 06/12/2016 1:16:38 PM By: [name] Entered By: [name] on 06/12/2016 11:13:00 [place], [name] ([id number]) -------------------------------------------------------------------------------- Patient/Caregiver Education Details Patient Name: [name]. Date of Service: 06/12/2016 10:30 AM Medical Record Patient Account Number: [id number] Number: Treating RN: [name] Dec 23, 1934 (80 y.o. Other Clinician: Date of Birth/Gender: Female) Treating [name], [name] Primary Care Physician/Extender: Claudette Laws Physician: Suella [place] in Treatment: 29 Referring Physician: [name] Education Assessment Education Provided To: Patient Education Topics Provided Wound/Skin Impairment: Handouts: Other: do not get wrap wet Methods: Demonstration, Explain/Verbal Responses: State content correctly Electronic Signature(s) Signed: 06/12/2016 1:16:38 PM By: [name] Entered By: [name] on 06/12/2016 11:46:20 [name], [name] ([id number]) -------------------------------------------------------------------------------- Wound Assessment Details Patient Name: Carrie Mulligan C. Date of Service: 06/12/2016 10:30 AM Medical Record Patient Account Number: [id number] Number: Treating RN: [name] [birth year] (80 y.o. Other Clinician: Date of Birth/Sex: Female) Treating [name], [name] Primary Care Physician/Extender: Claudette Laws Physician: Referring Physician: [name] in Treatment: 29 Wound Status Wound Number: 8 Primary Venous Leg Ulcer Etiology: Wound Location: Right Lower Leg  - Medial Wound Status: Open Wounding Event: Trauma Comorbid Cataracts, Arrhythmia, Hypotension, Date Acquired: 04/06/2016 History: Osteoarthritis Weeks Of Treatment: 7 Clustered Wound: No Photos Photo Uploaded By: [name] on 06/12/2016 11:56:34 Wound Measurements Length: (cm) 0.7 % Reduction i Width: (cm) 1 % Reduction i Depth: (cm) 0.3 Epithelializa Area: (cm) 0.55 Tunneling: Volume: (cm) 0.165 Undermining: n Area: -150% n Volume: -650% tion: None No No Wound Description Classification: Partial Thickness Wound Margin: Distinct, outline attached Exudate Amount: Large Exudate Type: Serosanguineous Exudate Color: red, brown Foul Odor After Cleansing: No Wound Bed Granulation Amount: Small (1-33%) Exposed Structure Granulation Quality: Red Fascia Exposed: No [place], [name] ([id number]) Necrotic Amount: Large (67-100%) Fat Layer Exposed: No Necrotic Quality: Eschar, Adherent Slough Tendon Exposed: No Muscle Exposed: No Joint Exposed: No Bone Exposed: No Limited to Skin Breakdown Periwound Skin Texture Texture Color No Abnormalities Noted: No No Abnormalities Noted: No Localized Edema: Yes Erythema: Yes Erythema Location: Circumferential Moisture No Abnormalities Noted: No Temperature / Pain Moist: Yes Temperature: No Abnormality Tenderness on Palpation: Yes Wound Preparation Ulcer Cleansing: Other: soap and water, Topical Anesthetic Applied: Other: lidocaine 4%, Treatment Notes Wound #8 (Right, Medial Lower Leg) 1. Cleansed with: Clean wound with Normal Saline Cleanse wound with antibacterial soap and water 2. Anesthetic Topical Lidocaine 4% cream to wound bed prior to debridement 4. Dressing Applied: Iodoflex 5. Secondary Dressing Applied ABD Pad Dry Gauze Notes kerlix and coban Electronic Signature(s) Signed: 06/12/2016 1:16:38 PM By: [name] Entered By: [name] on 06/12/2016 11:25:28 Samuelson, [name]  ([id number]) -------------------------------------------------------------------------------- Vitals Details Patient Name: Carrie Mulligan C. Date of Service: 06/12/2016 10:30 AM Medical Record Patient Account Number: [id number] Number: Treating RN: [name] 04-16-1935 (80 y.o. Other Clinician: Date of Birth/Sex: Female) Treating [name], [name] Primary Care Physician/Extender: Claudette Laws Physician: Referring Physician: [name] in Treatment: 29 Vital Signs Time Taken: 11:13 Temperature (F): 97.5 Height (in): 68 Pulse (bpm): 92 Weight (lbs): 178 Respiratory Rate (breaths/min): 16 Body Mass Index (  BMI): 27.1 Blood Pressure (mmHg): 120/82 Reference Range: 80 - 120 mg / dl Electronic Signature(s) Signed: 06/12/2016 1:16:38 PM By: [name] Entered By: [name] on 06/12/2016 11:16:51

## 2016-06-13 NOTE — Procedures (Signed)
S/P L2 and L4 VP. Sacroplasty bilateral approach

## 2016-06-13 NOTE — Discharge Instructions (Signed)
KYPHOPLASTY/VERTEBROPLASTY DISCHARGE INSTRUCTIONS ° °Medications: (check all that apply) ° °   Resume all home medications as before procedure. °                 °  °Continue your pain medications as prescribed as needed.  Over the next 3-5 days, decrease your pain medication as tolerated.  Over the counter medications (i.e. Tylenol, ibuprofen, and aleve) may be substituted once severe/moderate pain symptoms have subsided. ° ° Wound Care: °- Bandages may be removed the day following your procedure.  You may get your incision wet once bandages are removed.  Bandaids may be used to cover the incisions until scab formation.  Topical ointments are optional. ° °- If you develop a fever greater than 101 degrees, have increased skin redness at the incision sites or pus-like oozing from incisions occurring within 1 week of the procedure, contact radiology at [phone] or [phone]. ° °- Ice pack to back for 15-20 minutes 2-3 time per day for first 2-3 days post procedure.  The ice will expedite muscle healing and help with the pain from the incisions. ° ° Activity: °- Bedrest today with limited activity for 24 hours post procedure. ° °- No driving for 48 hours. ° °- Increase your activity as tolerated after bedrest (with assistance if necessary). ° °- Refrain from any strenuous activity or heavy lifting (greater than 10 lbs.). ° ° Follow up: °- Contact radiology at [phone] or [phone] if any questions/concerns. ° °- A physician assistant from radiology will contact you in approximately 1 week. ° °- If a biopsy was performed at the time of your procedure, your referring physician should receive the results in usually 2-3 days. ° ° ° ° ° ° ° ° °

## 2016-06-13 NOTE — Progress Notes (Signed)
Carrie, [name] ([id number]) Visit Report for 06/12/2016 Chief Complaint Document Details Carrie, [name] Date of Service: 06/12/2016 10:30 AM Patient Name: C. Patient Account Number: [id number] Medical Record Treating RN: [name] [id number] Number: Other Clinician: Aug 21, 1934 (81 y.o. Treating Jaydeen Darley, [place] Date of Birth/Sex: Female) Physician/Extender: G Primary Care [name] Physician: Referring Physician: [name] in Treatment: 29 Information Obtained from: Patient Chief Complaint Patient here for reevaluation of her right lower externally ulcer Electronic Signature(s) Signed: 06/12/2016 4:39:00 PM By: [name] MD Entered By: [name] on 06/12/2016 12:32:30 Briney, [name] ([id number]) -------------------------------------------------------------------------------- HPI Details [name] Date of Service: 06/12/2016 10:30 AM Patient Name: C. Patient Account Number: [id number] Medical Record Treating RN: [name] [id number] Number: Other Clinician: [birth year] (81 y.o. Treating [name], [place] Date of Birth/Sex: Female) Physician/Extender: G [place] Physician: Referring Physician: [name] in Treatment: 29 History of Present Illness HPI Description: 11/22/15; this is [name] is a 81 year old woman who lives at home on her own. According to the patient and her daughter was present she has had long-standing edema in her legs dating back many years. She also has a history of chronic systolic heart failure, atrial fibrillation and is status post mitral valve replacement. Last [facility]ogram I [facility] showed an ejection fraction of 40-45% she is on Lasix 60 mg a day and spironolactone 25 mg a day. Her current problem began in December around Christmas time she developed a small hematoma in the medial part of her left leg which rapidly expanded to a very large hematoma that  required surgical debridement. This situation was complicated by the fact that the patient is on long-standing Coumadin for mechanical heart valve. She went to the OR had this evacuated on January 4 /17. The wound has gradually improved however she has developed a small wounds around this area and more recently a wound on the right lateral leg. She is weeping edema fluid. The patient is already been to see vascular surgery. It was recommended that she wear Unna boots, she did not tolerate this due to pain in the left leg. She was then prescribed Juzo stockings and really can't get these on herself although truthfully there is probably too much edema for a Juzo stockings currently. She is not a diabetic and has no history of PAD or claudication that I could elicit. She does not use the external compression pumps reliably. She comes today with notes from her primary physician and Dr. Ronalee Belts both recommending various forms of compression but the patient does not really complied with them. Has been using the external compression pumps with some regularity but certainly not daily on the right leg and this has helped. I also note that her daughter tells me the history that she does not sleep in bed at home. She has a hospital bed but with her legs up she finds this painful so she sleeps in the couch sitting up with her legs dependent. 11/29/15; the patient is arrives today accompanied by her son. He expresses satisfaction that she is maintained the compression all week. 12/06/15; the patient has kept her Profore light wraps on, we have good edema control no major change in the wounds we have been using Aquacel 12/13/15; changed to RTD last week. One of the 3 wounds on her left medial leg is healed she has 2 remaining wounds here and one on the right lateral leg. 12/18/2015 -- the patient was at Dr. Nino Parsley office today and he  was seeing her for an arterial study. While the wrap was being removed she  had an inadvertent laceration of the left proximal anterior leg which bled quite profusely and a compression dressing was applied over this and the patient was here to get her Profore wraps done. I was asked to see the patient to make an assessment and treat appropriately. 12/26/15; the patient's injury on the left proximal leg and Steri-Strips removed after soaking. There is an open area here. The original wounds to still open on the right lateral and left medial leg. 01/03/16 patient's injury on her proximal left leg looks quite good. Still small open area on the medial left leg which appears to be improving. The area on the right lateral leg still substantially open with no real improvement in wound depth. Her edema control is marginal with a Profore light. We have been using RTD Yielding, Markeeta C. ([id number]) for 3-4 weeks without any major change 01/10/16: wounds without s/s of infection. vascular results are pending regarding arterial studies. 01/17/16; patient comes in today complaining of severe pain however I think most of this is in the right hip not related to her wounds. She continues with a oval-shaped wound on the right lateral leg, trauma to the left anterior leg just below her tibial plateau. She has a smaller eschar on the left anterior leg. She is being using Prisma however she informs [place] today that she is actually allergic to silver, nose this from a previous application at [facility] some years ago 01/24/16; edema is not so well controlled today, I think I reduced her to [place] bilaterally last week. The area which was a scissor injury on her left upper anterior lower leg is fully epithelialized. She only has a small open area remaining on the medial aspect of her left leg. The oval-shaped wound on the right mid lateral leg may be a bit smaller. Debrided of surface slough nonviable subcutaneous tissue. I had changed to collagen 2 weeks ago in an attempt to get this to  close 01/31/16 all the patient's wounds on her left leg give healed. We have good edema control with bilateral Profore lites which she has been compliant with. She still has the oval-shaped wound on the right lateral calf allergic even this appears to be slowly improving. The patient has juxtalite stockings at home. She states she thinks she can put these on. She also has external compression pumps at home although I think her compliance with this has not been good in the past. She complains today of edema in her thighs. Tells me she takes 40 mg of Lasix daily 02/21/16; only 1 small wound remains on the lateral aspect of the right calf. She is using Juzo stockings on the left leg although she complains about difficulty in applying them. She has external compression pumps at home 02/28/16; the small open wound on her right lateral calf is improved in terms of wound area. It appears that she has a wrap injury on the anterior aspect of the upper leg 03/06/16; the small open wound on her right lateral calf has a very small open area remaining. The wrap injury on the anterior aspect of the upper leg also appears better. She arrives today in clinic with a history of dyspnea with minimal exertion starting with the last 2-4 days. She does not describe chest pain. Her son and our intake nurse think she has facial swelling. She has a history of an artificial mitral valve on Coumadin  03/13/16; the small wound on the right lateral calf is no better this week. The superior wrap injury anteriorly requires debridement of surface eschar and nonviable subcutaneous tissue. She has been to her primary doctor with regards to her dyspnea we identified last week. Per the patient's description her Lasix has been increased 03/21/2016 -- patient of Dr. Dellia Nims who could not keep her appointment yesterday but has come in today with the right lower leg looking good and this is the leg which has been treated in the recent past.  However, her left lower extremity is extremely swollen, tender and edematous with redness and discoloration. 03/27/16; there is only 2 small areas remain on the right leg. The edema that was so concerning last week has come down however there is extensive bruising on the lateral left leg medial left foot suggesting that she lost some blood into the leg itself. I checked her hemoglobin on 9/21 was 14.5. Her INR was over 6. Duplex ultrasound was negative for DVT 04/03/16 at this point in time today patient is actually doing substantially better in regard to the wound on the anterior right lower extremity. currently there is no slough or eschar noted and no evidence of erythema, discharge, or local infection. She is exhibiting no signs of systemic infection. She is tolerating the dressing changes as well as the wrapping at this point in time. 04/24/16; I have not seen Mrs. Duchesneau for 3 weeks. Apparently she was admitted to hospital for respiratory issues/also apparently has had multiple compression fractures and had kyphoplasty. When she was last here she only had one small open area remaining on the left leg she was using juxtalite stockings. Her son is upset about restarting the Coumadin which she blames or the swelling in her legs. She is on Coumadin for prophylaxis with a chronic artificial heart valve. KASSADIE, PANCAKE ([id number]) 04/30/16 at this point in time patient has been tolerating the 3 layer compression wrap as well as the calcium alginate. 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 With tthat being said she does have the continued wound of the left lower extremity as well as the right medial lower extremity. The right side is significantly smaller compared to the left but actually is slough covered. fortunately she has no significant tenderness at rest although with manipulation of the right location of the wound this is significantly tender. 05/07/16 for follow-up evaluation  today both the patient's wounds appear to be significantly improved in size compared to last week. She is also having less pain at both locations currently. Her pain level at most is related to be a 1-2 out of 10. She does have some discharge but fortunately no evidence of infection at this point.she also continues to tolerate the compression wraps well. 05/14/16; the patient's wound on the left leg actually looks as though it's on its way to closure. She is using collagen to this area. She has a small punched out area over the right medial calf. The cause of this is not really clear it has probably 0.4 cm of depth. She has an IV in her right hand which she states is for IV fluid when she develops low sodiums her potassiums, the etiology of this is not clear 05-21-16 she presents today with continued ulcerations the right lower extremity, posterior aspect. she has been using Iodoflex and compression therapy to the area. She denies any new injuries or trauma. She does have 2 areas proximal to this ulceration of dry crust  along with an area to her left posterior lower leg that is purple discolored area appears similar to how her current ulceration started, she denies any trauma or injury to the left posterior leg we will monitor all of these areas. 05/28/16; the patient is down to 1 small open area on the right medial mid calf. This is however larger and in 50% of the surface area deeper approximately 0.4 cm. The reason for this deterioration is unclear. I've gone ahead and done a culture of this area she also tells [place] today that she is short of breath. She is also noted a swelling on her lower left eyelid 06/05/16; the patient is 1 small open area on the right medial mid calf. This looks about the same as last week. The deep area, medial 50% of the wound looks about the same. CandS I did last week was negative. The entire area looks about the same 06/12/16; not much change in this over the course  of the last week. Patient comes in today complaining of extreme back pain. Apparently she has either a kyphoplasty or vertebral plasty scheduled at [facility] radiology tomorrow. For this reason no debridement today. We have been using Iodoflex Electronic Signature(s) Signed: 06/12/2016 4:39:00 PM By: [name] MD Entered By: [name] on 06/12/2016 12:43:48 Fuchs, [name] ([id number]) -------------------------------------------------------------------------------- Physical Exam Details [name] Date of Service: 06/12/2016 10:30 AM Patient Name: C. Patient Account Number: [id number] Medical Record Treating RN: [name] [id number] Number: Other Clinician: 08/15/1934 (81 y.o. Treating Kelin Borum Date of Birth/Sex: Female) Physician/Extender: G Primary Care [name] Physician: Referring Physician: [name] in Treatment: 29 Constitutional Sitting or standing Blood Pressure is within target range for patient.. Pulse regular and within target range for patient.Marland Kitchen Respirations regular, non-labored and within target range.. Temperature is normal and within the target range for the patient.. Eyes Conjunctivae clear. No discharge.. Cardiovascular Pedal pulses palpable and strong bilaterally.. No edema.. Gastrointestinal (GI) Abdomen is soft and non-distended without masses or tenderness. Bowel sounds active in all quadrants.. Lymphatic Nonpalpable in the popliteal or inguinal area. Notes Wound exam; the patient has a small open area on the right medial calf as was the case last week 50% of this is deeper. The cause of this remains unclear. CNS was -2 weeks ago there is no evidence of surrounding infection Electronic Signature(s) Signed: 06/12/2016 4:39:00 PM By: [name] MD Entered By: [name] on 06/12/2016 12:45:17 Cartwright, [name]  ([id number]) -------------------------------------------------------------------------------- Physician Orders Details [name] Date of Service: 06/12/2016 10:30 AM Patient Name: C. Patient Account Number: [id number] Medical Record Treating RN: [name] [id number] Number: Other Clinician: [birth year] (81 y.o. Treating Rjay Revolorio, [place] Date of Birth/Sex: Female) Physician/Extender: G Primary Care [name] Physician: Referring Physician: [name] in Treatment: 29 Verbal / Phone Orders: Yes Clinician: Carolyne Fiscal, Debi Read Back and Verified: Yes Diagnosis Coding Wound Cleansing Wound #8 Right,Medial Lower Leg o Clean wound with Normal Saline. o Cleanse wound with mild soap and water o No tub bath. - give sink or bed baths Anesthetic Wound #8 Right,Medial Lower Leg o Topical Lidocaine 4% cream applied to wound bed prior to debridement - for clinic use Skin Barriers/Peri-Wound Care Wound #8 Right,Medial Lower Leg o Moisturizing lotion Primary Wound Dressing Wound #8 Right,Medial Lower Leg o Iodoflex Secondary Dressing Wound #8 Right,Medial Lower Leg o ABD pad o Dry Gauze Dressing Change Frequency Wound #8 Right,Medial Lower Leg o Change dressing every week Follow-up Appointments Wound #8 Right,Medial  Lower Leg o Return Appointment in 1 week. LEIGHA, OLBERDING ([id number]) Edema Control Wound #8 Right,Medial Lower Leg o Kerlix and Coban - Right Lower Extremity Additional Orders / Instructions Wound #8 Right,Medial Lower Leg o Increase protein intake. o Other: - Left leg-Cleanse with soap and water and apply kerlix and ace wrap change on Tuesdays and Fridays by RN until pt get her own compression stockings/wraps. Electronic Signature(s) Signed: 06/12/2016 1:16:38 PM By: [name] Signed: 06/12/2016 4:39:00 PM By: [name] MD Entered By: [name] on 06/12/2016 11:30:43 Whitsel, [name]  ([id number]) -------------------------------------------------------------------------------- Problem List Details [name] Date of Service: 06/12/2016 10:30 AM Patient Name: C. Patient Account Number: [id number] Medical Record Treating RN: [name] [id number] Number: Other Clinician: 09-30-1934 (81 y.o. Treating Laryah Neuser Date of Birth/Sex: Female) Physician/Extender: G Primary Care [name] Physician: Referring Physician: [name] in Treatment: 29 Active Problems ICD-10 Encounter Code Description Active Date Diagnosis L97.812 Non-pressure chronic ulcer of other part of right lower leg 04/30/2016 Yes with fat layer exposed I87.333 Chronic venous hypertension (idiopathic) with ulcer and 04/03/2016 Yes inflammation of bilateral lower extremity I89.0 Lymphedema, not elsewhere classified 04/03/2016 Yes X90.24 Chronic systolic (congestive) heart failure 04/03/2016 Yes Inactive Problems Resolved Problems ICD-10 Code Description Active Date Resolved Date S81.812S Laceration without foreign body, left lower leg, sequela 04/03/2016 04/03/2016 Electronic Signature(s) Signed: 06/12/2016 4:39:00 PM By: [name] MD Entered By: [name] on 06/12/2016 12:32:08 [name] ([id number]) -------------------------------------------------------------------------------- Progress Note Details [name] Date of Service: 06/12/2016 10:30 AM Patient Name: C. Patient Account Number: [id number] Medical Record Treating RN: [name] [id number] Number: Other Clinician: February 04, 1935 (81 y.o. Treating Breslin Burklow, [place] Date of Birth/Sex: Female) Physician/Extender: G Primary Care [name] Physician: Referring Physician: [name] in Treatment: 29 Subjective Chief Complaint Information obtained from Patient Patient here for reevaluation of her right lower externally ulcer History of Present Illness (HPI) 11/22/15; this  is Stmarie is a 81 year old woman who lives at home on her own. According to the patient and her daughter was present she has had long-standing edema in her legs dating back many years. She also has a history of chronic systolic heart failure, atrial fibrillation and is status post mitral valve replacement. Last echocardiogram I see [facility] showed an ejection fraction of 40-45% she is on Lasix 60 mg a day and spironolactone 25 mg a day. Her current problem began in December around Christmas time she developed a small hematoma in the medial part of her left leg which rapidly expanded to a very large hematoma that required surgical debridement. This situation was complicated by the fact that the patient is on long-standing Coumadin for mechanical heart valve. She went to the OR had this evacuated on January 4 /17. The wound has gradually improved however she has developed a small wounds around this area and more recently a wound on the right lateral leg. She is weeping edema fluid. The patient is already been to see vascular surgery. It was recommended that she wear Unna boots, she did not tolerate this due to pain in the left leg. She was then prescribed Juzo stockings and really can't get these on herself although truthfully there is probably too much edema for a Juzo stockings currently. She is not a diabetic and has no history of PAD or claudication that I could elicit. She does not use the external compression pumps reliably. She comes today with notes from her primary physician and Dr. Ronalee Belts both recommending various  forms of compression but the patient does not really complied with them. Has been using the external compression pumps with some regularity but certainly not daily on the right leg and this has helped. I also note that her daughter tells me the history that she does not sleep in bed at home. She has a hospital bed but with her legs up she finds this painful so she  sleeps in the couch sitting up with her legs dependent. 11/29/15; the patient is arrives today accompanied by her son. He expresses satisfaction that she is maintained the compression all week. 12/06/15; the patient has kept her Profore light wraps on, we have good edema control no major change in the wounds we have been using Aquacel 12/13/15; changed to RTD last week. One of the 3 wounds on her left medial leg is healed she has 2 remaining wounds here and one on the right lateral leg. 12/18/2015 -- the patient was at Dr. Nino Parsley office today and he was seeing her for an arterial study. While the wrap was being removed she had an inadvertent laceration of the left proximal anterior leg which bled quite profusely and a compression dressing was applied over this and the patient was here to get her Profore wraps done. I was asked to see the patient to make an assessment and treat appropriately. SURIYA, KOVARIK ([id number]) 12/26/15; the patient's injury on the left proximal leg and Steri-Strips removed after soaking. There is an open area here. The original wounds to still open on the right lateral and left medial leg. 01/03/16 patient's injury on her proximal left leg looks quite good. Still small open area on the medial left leg which appears to be improving. The area on the right lateral leg still substantially open with no real improvement in wound depth. Her edema control is marginal with a Profore light. We have been using RTD for 3-4 weeks without any major change 01/10/16: wounds without s/s of infection. vascular results are pending regarding arterial studies. 01/17/16; patient comes in today complaining of severe pain however I think most of this is in the right hip not related to her wounds. She continues with a oval-shaped wound on the right lateral leg, trauma to the left anterior leg just below her tibial plateau. She has a smaller eschar on the left anterior leg. She is being using  Prisma however she informs [place] today that she is actually allergic to silver, nose this from a previous application at [facility] some years ago 01/24/16; edema is not so well controlled today, I think I reduced her to [place] bilaterally last week. The area which was a scissor injury on her left upper anterior lower leg is fully epithelialized. She only has a small open area remaining on the medial aspect of her left leg. The oval-shaped wound on the right mid lateral leg may be a bit smaller. Debrided of surface slough nonviable subcutaneous tissue. I had changed to collagen 2 weeks ago in an attempt to get this to close 01/31/16 all the patient's wounds on her left leg give healed. We have good edema control with bilateral Profore lites which she has been compliant with. She still has the oval-shaped wound on the right lateral calf allergic even this appears to be slowly improving. The patient has juxtalite stockings at home. She states she thinks she can put these on. She also has external compression pumps at home although I think her compliance with this has not been good  in the past. She complains today of edema in her thighs. Tells me she takes 40 mg of Lasix daily 02/21/16; only 1 small wound remains on the lateral aspect of the right calf. She is using Juzo stockings on the left leg although she complains about difficulty in applying them. She has external compression pumps at home 02/28/16; the small open wound on her right lateral calf is improved in terms of wound area. It appears that she has a wrap injury on the anterior aspect of the upper leg 03/06/16; the small open wound on her right lateral calf has a very small open area remaining. The wrap injury on the anterior aspect of the upper leg also appears better. She arrives today in clinic with a history of dyspnea with minimal exertion starting with the last 2-4 days. She does not describe chest pain. Her son and our intake nurse think  she has facial swelling. She has a history of an artificial mitral valve on Coumadin 03/13/16; the small wound on the right lateral calf is no better this week. The superior wrap injury anteriorly requires debridement of surface eschar and nonviable subcutaneous tissue. She has been to her primary doctor with regards to her dyspnea we identified last week. Per the patient's description her Lasix has been increased 03/21/2016 -- patient of Dr. Dellia Nims who could not keep her appointment yesterday but has come in today with the right lower leg looking good and this is the leg which has been treated in the recent past. However, her left lower extremity is extremely swollen, tender and edematous with redness and discoloration. 03/27/16; there is only 2 small areas remain on the right leg. The edema that was so concerning last week has come down however there is extensive bruising on the lateral left leg medial left foot suggesting that she lost some blood into the leg itself. I checked her hemoglobin on 9/21 was 14.5. Her INR was over 6. Duplex ultrasound was negative for DVT 04/03/16 at this point in time today patient is actually doing substantially better in regard to the wound on the anterior right lower extremity. currently there is no slough or eschar noted and no evidence of erythema, discharge, or local infection. She is exhibiting no signs of systemic infection. She is tolerating the dressing changes as well as the wrapping at this point in time. 04/24/16; I have not seen Mrs. Steinbach for 3 weeks. Apparently she was admitted to hospital for respiratory MELLISSA, CONLEY. ([id number]) issues/also apparently has had multiple compression fractures and had kyphoplasty. When she was last here she only had one small open area remaining on the left leg she was using juxtalite stockings. Her son is upset about restarting the Coumadin which she blames or the swelling in her legs. She is on Coumadin for  prophylaxis with a chronic artificial heart valve. 04/30/16 at this point in time patient has been tolerating the 3 layer compression wrap as well as the calcium alginate. 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 With tthat being said she does have the continued wound of the left lower extremity as well as the right medial lower extremity. The right side is significantly smaller compared to the left but actually is slough covered. fortunately she has no significant tenderness at rest although with manipulation of the right location of the wound this is significantly tender. 05/07/16 for follow-up evaluation today both the patient's wounds appear to be significantly improved in size compared to last week. She  is also having less pain at both locations currently. Her pain level at most is related to be a 1-2 out of 10. She does have some discharge but fortunately no evidence of infection at this point.she also continues to tolerate the compression wraps well. 05/14/16; the patient's wound on the left leg actually looks as though it's on its way to closure. She is using collagen to this area. She has a small punched out area over the right medial calf. The cause of this is not really clear it has probably 0.4 cm of depth. She has an IV in her right hand which she states is for IV fluid when she develops low sodiums her potassiums, the etiology of this is not clear 05-21-16 she presents today with continued ulcerations the right lower extremity, posterior aspect. she has been using Iodoflex and compression therapy to the area. She denies any new injuries or trauma. She does have 2 areas proximal to this ulceration of dry crust along with an area to her left posterior lower leg that is purple discolored area appears similar to how her current ulceration started, she denies any trauma or injury to the left posterior leg we will monitor all of these areas. 05/28/16; the patient is down to 1 small  open area on the right medial mid calf. This is however larger and in 50% of the surface area deeper approximately 0.4 cm. The reason for this deterioration is unclear. I've gone ahead and done a culture of this area she also tells [place] today that she is short of breath. She is also noted a swelling on her lower left eyelid 06/05/16; the patient is 1 small open area on the right medial mid calf. This looks about the same as last week. The deep area, medial 50% of the wound looks about the same. CandS I did last week was negative. The entire area looks about the same 06/12/16; not much change in this over the course of the last week. Patient comes in today complaining of extreme back pain. Apparently she has either a kyphoplasty or vertebral plasty scheduled at [facility] radiology tomorrow. For this reason no debridement today. We have been using Iodoflex Objective Constitutional Sitting or standing Blood Pressure is within target range for patient.. Pulse regular and within target range for patient.Marland Kitchen Respirations regular, non-labored and within target range.. Temperature is normal and within the target range for the patient.. Vitals Time Taken: 11:13 AM, Height: 68 in, Weight: 178 lbs, BMI: 27.1, Temperature: 97.5 F, Pulse: 92 bpm, Respiratory Rate: 16 breaths/min, Blood Pressure: 120/82 mmHg. Mentzer, Misty C. ([id number]) Eyes Conjunctivae clear. No discharge.. Cardiovascular Pedal pulses palpable and strong bilaterally.. No edema.. Gastrointestinal (GI) Abdomen is soft and non-distended without masses or tenderness. Bowel sounds active in all quadrants.. Lymphatic Nonpalpable in the popliteal or inguinal area. General Notes: Wound exam; the patient has a small open area on the right medial calf as was the case last week 50% of this is deeper. The cause of this remains unclear. CNS was -2 weeks ago there is no evidence of surrounding infection Integumentary (Hair, Skin) Wound #8  status is Open. Original cause of wound was Trauma. The wound is located on the Right,Medial Lower Leg. The wound measures 0.7cm length x 1cm width x 0.3cm depth; 0.55cm^2 area and 0.165cm^3 volume. The wound is limited to skin breakdown. There is no tunneling or undermining noted. There is a large amount of serosanguineous drainage noted. The wound margin is distinct with  the outline attached to the wound base. There is small (1-33%) red granulation within the wound bed. There is a large (67-100%) amount of necrotic tissue within the wound bed including Eschar and Adherent Slough. The periwound skin appearance exhibited: Localized Edema, Moist, Erythema. The surrounding wound skin color is noted with erythema which is circumferential. Periwound temperature was noted as No Abnormality. The periwound has tenderness on palpation. Assessment Active Problems ICD-10 L97.812 - Non-pressure chronic ulcer of other part of right lower leg with fat layer exposed I87.333 - Chronic venous hypertension (idiopathic) with ulcer and inflammation of bilateral lower extremity I89.0 - Lymphedema, not elsewhere classified X90.24 - Chronic systolic (congestive) heart failure Plan Wound Cleansing: Hanel, Zanylah C. ([id number]) Wound #8 Right,Medial Lower Leg: Clean wound with Normal Saline. Cleanse wound with mild soap and water No tub bath. - give sink or bed baths Anesthetic: Wound #8 Right,Medial Lower Leg: Topical Lidocaine 4% cream applied to wound bed prior to debridement - for clinic use Skin Barriers/Peri-Wound Care: Wound #8 Right,Medial Lower Leg: Moisturizing lotion Primary Wound Dressing: Wound #8 Right,Medial Lower Leg: Iodoflex Secondary Dressing: Wound #8 Right,Medial Lower Leg: ABD pad Dry Gauze Dressing Change Frequency: Wound #8 Right,Medial Lower Leg: Change dressing every week Follow-up Appointments: Wound #8 Right,Medial Lower Leg: Return Appointment in 1 week. Edema  Control: Wound #8 Right,Medial Lower Leg: Kerlix and Coban - Right Lower Extremity Additional Orders / Instructions: Wound #8 Right,Medial Lower Leg: Increase protein intake. Other: - Left leg-Cleanse with soap and water and apply kerlix and ace wrap change on Tuesdays and Fridays by RN until pt get her own compression stockings/wraps. continue Iodoflex this week, I wounder if the faciltiy is placing this deeply into the wound. mechanical debridement likely to be necessary next week Electronic Signature(s) Signed: 06/12/2016 4:39:00 PM By: [name] MD Entered By: [name] on 06/12/2016 13:00:11 Lorenz, [name] ([id number]) -------------------------------------------------------------------------------- SuperBill Details Patient Name: [name] C. Date of Service: 06/12/2016 Medical Record Patient Account Number: [id number] Number: Treating RN: [name] 06-14-1935 (80 y.o. Other Clinician: Date of Birth/Sex: Female) Treating Florencio Hollibaugh Primary Care Physician/Extender: Claudette Laws Physician: Suella Grove in Treatment: 29 Referring Physician: [name] Diagnosis Coding ICD-10 Codes Code Description [phone] Non-pressure chronic ulcer of other part of right lower leg with fat layer exposed Chronic venous hypertension (idiopathic) with ulcer and inflammation of bilateral lower I87.333 extremity I89.0 Lymphedema, not elsewhere classified D40.81 Chronic systolic (congestive) heart failure Facility Procedures CPT4 Code: [id number] Description: 99214 - WOUND CARE VISIT-LEV 4 EST PT Modifier: Quantity: 1 Physician Procedures CPT4: Description Modifier Quantity Code [id number] 99213 - WC PHYS LEVEL 3 - EST PT 1 ICD-10 Description Diagnosis L97.812 Non-pressure chronic ulcer of other part of right lower leg with fat layer exposed Electronic Signature(s) Signed: 06/12/2016 4:39:00 PM By: [name] MD Entered By: [name] on  06/12/2016 13:00:48

## 2016-06-13 NOTE — H&P (Signed)
Chief Complaint: Patient was seen in consultation today for Lumbar 2 and Lumbar 4 and possible sacral 1 vertebroplasty/kyphoplasty at the request of  Dr [name]  Referring Physician(s): Dr [name]  Supervising Physician: Luanne Bras  Patient Status: [facility] - Out-pt  History of Present Illness: [name] is a 81 y.o. female   Known pt to IR Thoracic 6/7 and Lumbar 5 VP/KP 04/17/2016 Pt did well post procedure but has since developed worsening back pain No new injury per se; but has been in active Rehab at [place]  Returned 11/20 for follow up visit and complained of continued back pain after initial improvement Dr Estanislado Pandy reviewed imaging from 03/2016 and felt evidence of acute fractures at Lumbar 2/4 and possible Sacral 1 would be amenable to vertebroplasty Pt was tender at these areas per exam Now scheduled for L2/4 and possible S1 vertebroplasty  Has stopped coumadin x 5 days Did nor take Lovenox shot today   Past Medical History:  Diagnosis Date  . Arthritis    "back, hands, knees" (04/10/2016)  . Chronic atrial fibrillation ([place])    a. On Coumadin  . Chronic back pain   . Chronic combined systolic (congestive) and diastolic (congestive) heart failure    a. EF 25% by echo in 08/2015 b. RHC in 08/2015 showed normal filling pressures  . Compression fracture    "several; all in my back" (04/10/2016)  . GERD (gastroesophageal reflux disease)   . Heart murmur   . Hypertension   . S/P MVR (mitral valve replacement)    a. MVR 1994 b. redo MVR in 08/2014 - on Coumadin  . Shortness of breath dyspnea     Past Surgical History:  Procedure Laterality Date  . BREAST LUMPECTOMY Right 2005?   benign lump excision  . CARDIAC CATHETERIZATION    . [place], 2016   "MVR; MVR"  . CATARACT EXTRACTION W/PHACO Right 02/20/2015   Procedure: CATARACT EXTRACTION PHACO AND INTRAOCULAR LENS PLACEMENT (IOC);  Surgeon: Estill Cotta, MD;  Location: [facility] ORS;  Service: Ophthalmology;  Laterality: Right;  US01:31.2 AP 25.3% CDE40.13 Fluid lot # [id number]  . ELECTROPHYSIOLOGIC STUDY N/A 10/09/2015   Procedure: CARDIOVERSION;  Surgeon: Wellington Hampshire, MD;  Location: [facility] ORS;  Service: Cardiovascular;  Laterality: N/A;  . IR GENERIC HISTORICAL  04/01/2016   IR RADIOLOGIST EVAL & MGMT 04/01/2016 [facility]  . IR GENERIC HISTORICAL  04/15/2016   IR VERTEBROPLASTY CERV/THOR BX INC UNI/BIL INC/INJECT/IMAGING 04/15/2016 Luanne Bras, MD [facility]  . IR GENERIC HISTORICAL  04/15/2016   IR VERTEBROPLASTY EA ADDL (T&LS) BX INC UNI/BIL INC INJECT/IMAGING 04/15/2016 Luanne Bras, MD [facility]  . IR GENERIC HISTORICAL  04/15/2016   IR VERTEBROPLASTY EA ADDL (T&LS) BX INC UNI/BIL INC INJECT/IMAGING 04/15/2016 Luanne Bras, MD [facility]  . IR GENERIC HISTORICAL  05/20/2016   IR RADIOLOGIST EVAL & MGMT 05/20/2016 [facility]  . IRRIGATION AND DEBRIDEMENT HEMATOMA Left 07/05/2015   Procedure: IRRIGATION AND DEBRIDEMENT HEMATOMA;  Surgeon: Robert Bellow, MD;  Location: [facility] ORS;  Service: General;  Laterality: Left;  . pyloric stenosis  07/1935  . US ECHOCARDIOGRAPHY      Allergies: Ace inhibitors; Hydrocodone; Mercury; Nickel; Silver; Cephalexin; Clindamycin/lincomycin; and Penicillins  Medications: Prior to Admission medications   Medication Sig Start Date End Date Taking? Authorizing Provider  acetaminophen (TYLENOL) 325 MG tablet Take 650 mg by mouth 4 (four) times daily.   Yes Historical Provider, MD  acetaminophen (  TYLENOL) 500 MG tablet Take 500 mg by mouth every 6 (six) hours as needed for mild pain, moderate pain, fever or headache.   Yes Historical Provider, MD  aspirin EC 81 MG tablet Take 81 mg by mouth daily.    Yes Historical Provider, MD  Cholecalciferol (HM VITAMIN D3) 4000 units CAPS Take 1 capsule by mouth daily.   Yes Historical Provider, MD  diclofenac (FLECTOR) 1.3 %  PTCH Place 1 patch onto the skin 2 (two) times daily. To the greatest pain in lower back   Yes Historical Provider, MD  enoxaparin (LOVENOX) 40 MG/0.4ML injection Inject 40 mg into the skin daily.   Yes Historical Provider, MD  furosemide (LASIX) 40 MG tablet Take 40 mg by mouth 2 (two) times daily.    Yes Historical Provider, MD  KLOR-CON 10 10 MEQ tablet Take 2 capsules by mouth daily. 05/08/16  Yes Historical Provider, MD  methocarbamol (ROBAXIN) 500 MG tablet Take 250 mg by mouth 4 (four) times daily.   Yes Historical Provider, MD  mirtazapine (REMERON) 15 MG tablet Take 15 mg by mouth at bedtime.   Yes Historical Provider, MD  oxyCODONE (OXY IR/ROXICODONE) 5 MG immediate release tablet Take 1 tablet (5 mg total) by mouth every 4 (four) hours. 04/19/16  Yes Reyne Dumas, MD  oxyCODONE (OXY IR/ROXICODONE) 5 MG immediate release tablet Take 5 mg by mouth every 4 (four) hours as needed for moderate pain, severe pain or breakthrough pain.    Yes Historical Provider, MD  pantoprazole (PROTONIX) 40 MG tablet Take 40 mg by mouth daily.    Yes Historical Provider, MD  polyethylene glycol (MIRALAX / GLYCOLAX) packet Take 17 g by mouth 2 (two) times daily. 04/19/16  Yes Reyne Dumas, MD  Probiotic Product (RISA-BID PROBIOTIC PO) Take 1 capsule by mouth 2 (two) times daily.   Yes Historical Provider, MD  senna-docusate (SENOKOT-S) 8.6-50 MG tablet Take 1 tablet by mouth 2 (two) times daily. Patient taking differently: Take 2 tablets by mouth 2 (two) times daily.  04/19/16  Yes Reyne Dumas, MD  spironolactone (ALDACTONE) 25 MG tablet Take 25 mg by mouth 2 (two) times daily.    Yes Historical Provider, MD  traZODone (DESYREL) 50 MG tablet Take 50 mg by mouth at bedtime as needed for sleep.   Yes Historical Provider, MD  calcium carbonate (TUMS - DOSED IN MG ELEMENTAL CALCIUM) 500 MG chewable tablet Chew 1 tablet by mouth every 4 (four) hours as needed for indigestion or heartburn.    Historical Provider, MD    feeding supplement, ENSURE ENLIVE, (ENSURE ENLIVE) LIQD Take 237 mLs by mouth 2 (two) times daily between meals. Patient taking differently: Take 237 mLs by mouth daily as needed (nutrition).  04/19/16   Reyne Dumas, MD  liver oil-zinc oxide (DESITIN) 40 % ointment Apply 1 application topically as needed (bed sores).     Historical Provider, MD  metoprolol succinate (TOPROL-XL) 25 MG 24 hr tablet Take 1 tablet (25 mg total) by mouth daily. Patient not taking: Reported on 06/12/2016 10/10/15   Gladstone Lighter, MD  warfarin (COUMADIN) 5 MG tablet Take 1 tablet (5 mg total) by mouth daily. 10/10/15   Gladstone Lighter, MD     Family History  Problem Relation Age of Onset  . Stroke Mother   . Renal Disease Father     Social History   Social History  . Marital status: Widowed    Spouse name: N/A  . Number of children: N/A  .  Years of education: N/A   Social History Main Topics  . Smoking status: Former Smoker    Packs/day: 1.00    Years: 27.00    Types: Cigarettes  . Smokeless tobacco: Never Used     Comment: "quit smoking ~ 1980  . Alcohol use 0.0 oz/week     Comment: 04/10/2016 "I'll have a drink on holidays/special occasions"  . Drug use: No  . Sexual activity: No   Other Topics Concern  . None   Social History Narrative  . None     Review of Systems: A 12 point ROS discussed and pertinent positives are indicated in the HPI above.  All other systems are negative.  Review of Systems  Constitutional: Positive for activity change, appetite change and fatigue. Negative for fever.  Respiratory: Negative for shortness of breath.   Cardiovascular: Negative for chest pain.  Gastrointestinal: Negative for abdominal pain.  Musculoskeletal: Positive for back pain and gait problem.  Neurological: Positive for weakness.  Psychiatric/Behavioral: Negative for behavioral problems and confusion.    Vital Signs: BP 135/71   Pulse 74   Temp 97.7 F (36.5 C) (Oral)   Resp 16    Ht 5' 8.5" (1.74 m)   Wt 144 lb 5 oz (65.5 kg)   SpO2 91%   BMI 21.62 kg/m   Physical Exam  Constitutional: She is oriented to person, place, and time.  Cardiovascular: Normal rate and regular rhythm.   Murmur heard. Pulmonary/Chest: Effort normal and breath sounds normal. She has no wheezes.  Abdominal: Soft. Bowel sounds are normal. There is no tenderness.  Musculoskeletal: Normal range of motion.  Uses wc mostly Back pain  Neurological: She is alert and oriented to person, place, and time.  Skin: Skin is warm and dry.  Psychiatric: She has a normal mood and affect. Her behavior is normal. Judgment and thought content normal.  Nursing note and vitals reviewed.   Mallampati Score:  MD Evaluation Airway: WNL Heart: WNL Abdomen: WNL Chest/ Lungs: WNL ASA  Classification: 3 Mallampati/Airway Score: Two  Imaging: Ir Radiologist Eval & Mgmt  Result Date: 05/21/2016 EXAM: ESTABLISHED PATIENT OFFICE VISIT CHIEF COMPLAINT: The patient is a 81 year old right-handed lady who presents accompanied by her daughter in follow-up to a vertebral body augmentation at T6-T7 and L5 approximately 2 weeks ago. According to the patient and also her daughter, the main improvement has been noted since the procedures has been improved mobility with a walker with less pain on standing. Otherwise, the patient reports no significant change in her pain overall. She continues to have low back pain which is almost constant and radiates into the iliac regions. The pain does not radiate in a radicular manner into the lower extremities. She has no autonomic dysfunction of her bowel or bladder activity. With the oxycodone, her pain is controlled to a level 5 out of 10 in terms of intensity. Since her visitation the last time the patient reports significantly improved appetite. She completes 3 meals a day. Her weight has been steady and slightly increased to 137 pounds. She used to be at least 158 pounds prior to  the development of her fractures. She denies any recent chills, fever or rigors. She denies any UTI symptoms of dysuria, polyuria or of hematuria. She denies recent chest pain or shortness of breath. Recently she had a traumatic hematoma in one of her feet which was evacuated and is in the process of healing. Current Pain Level: 1-10 HISTORY OF PRESENT ILLNESS: As above.  Past medical history, family history and social history as per previously unchanged. She is still at [facility] where she undergoes ambulation. Present medications: Acetaminophen, aspirin, Ensure, furosemide, liver oil-zinc oxide, metoprolol, oxycodone every 6 hours prn, Protonix, MiraLax, Senokot, spironolactone, and warfarin. Allergies: Ace inhibitors, hydrocodone, mercury, nickel, silver, cephalexin, clindamycin, lincomycin and penicillins. PHYSICAL EXAMINATION: In mild to moderate distress on account of her pain. Patient sitting in a wheelchair slouched forward. Neurologically intact completely. The patient demonstrating no evidence of tenderness in the thoracic and lower thoracic regions. However, moderately severely tender in the lower lumbar regions at L2-L4. ASSESSMENT AND PLAN: Previous MRI scan of the thoracolumbar spine was reviewed with her and her daughter. The L2-L4 levels had areas of signal abnormality suggestive of subacute compression fractures. Also noted was abnormal signal in the sacral ala more prominent on the right-side. The option of performing vertebral body augmentation at L2, L4 and the right-side of the sacrum was again discussed with the patient and the patient's daughter. The patient wants to see how much more of her physical activity that she can tolerate with the present pain and pain medications. To this affect, it was decided to continue with the conservative management with gradually improving her level of activity as tolerated in rehab. Should her pain continued as it is right now or worsen, the  patient and her daughter were instructed to call to set up vertebral body augmentations at L2, L4 and S1. Questions were answered to their satisfaction. Both the daughter and the patient leave with good understanding and agreement with the above management plan. Electronically Signed   By: Luanne Bras M.D.   On: 05/20/2016 14:13    Labs:  CBC:  Recent Labs  06/09/16 0700 06/11/16 0620 06/13/16 0800 06/13/16 1033  WBC 3.7 4.5 4.2 6.5  HGB 13.0 13.2 12.9 13.6  HCT 38.9 39.8 39.3 42.5  PLT 227 247 236 225    COAGS:  Recent Labs  06/09/16 0700 06/11/16 0620 06/13/16 0800 06/13/16 1033  INR 1.98 1.16 0.96 0.94    BMP:  Recent Labs  06/09/16 0700 06/11/16 0620 06/13/16 0800 06/13/16 1033  NA 136 135 138 138  K 4.3 4.5 4.3 4.9  CL 101 100* 103 102  CO2 28 27 27 26   GLUCOSE 84 91 88 99  BUN 20 20 22* 18  CALCIUM 9.6 9.9 9.9 10.4*  CREATININE 1.24* 1.34* 1.39* 1.41*  GFRNONAA 41* 37* 35* 35*  GFRAA 47* 43* 41* 40*    LIVER FUNCTION TESTS:  Recent Labs  05/17/16 0650 05/20/16 0435 05/21/16 1625 06/01/16 0500  BILITOT 1.1 0.9 0.6 1.0  AST 24 24 25 18   ALT 18 18 19 16   ALKPHOS 150* 122 151* 129*  PROT 7.3 7.0 7.5 6.7  ALBUMIN 4.2 3.9 4.3 3.5    TUMOR MARKERS: No results for input(s): AFPTM, CEA, CA199, CHROMGRNA in the last 8760 hours.  Assessment and Plan:  Back pain Lumbar 2/4 and Sacral 1 acute fractures Scheduled for vertebroplasty/kyphoplasty Risks and Benefits discussed with the patient including, but not limited to education regarding the natural healing process of compression fractures without intervention, bleeding, infection, cement migration which may cause spinal cord damage, paralysis, pulmonary embolism or even death. All of the patient's questions were answered, patient is agreeable to proceed. Consent signed and in chart.  Thank you for this interesting consult.  I greatly enjoyed meeting EMMANUEL ERCOLE and look forward to  participating in their care.  A  copy of this report was sent to the requesting provider on this date.  Electronically Signed: Ronan Duecker A 06/13/2016, 11:39 AM   I spent a total of  30 Minutes   in face to face in clinical consultation, greater than 50% of which was counseling/coordinating care for lumbar 2/4 and possible Sacral 1 VP

## 2016-06-14 ENCOUNTER — Encounter (HOSPITAL_COMMUNITY): Payer: Self-pay | Admitting: Interventional Radiology

## 2016-06-14 ENCOUNTER — Non-Acute Institutional Stay (SKILLED_NURSING_FACILITY): Payer: Medicare Other | Admitting: Gerontology

## 2016-06-14 DIAGNOSIS — Z5181 Encounter for therapeutic drug level monitoring: Secondary | ICD-10-CM

## 2016-06-14 DIAGNOSIS — G8918 Other acute postprocedural pain: Secondary | ICD-10-CM | POA: Diagnosis not present

## 2016-06-14 NOTE — Progress Notes (Signed)
Late entry.  Attempeted to start IV per MD request when patient ws prone on procedure table.  IV inserted good blood return.  Infiltrated when flushed.  Dressing applied and IV removed.

## 2016-06-15 DIAGNOSIS — N189 Chronic kidney disease, unspecified: Secondary | ICD-10-CM | POA: Diagnosis not present

## 2016-06-15 LAB — BASIC METABOLIC PANEL
Anion gap: 8 (ref 5–15)
BUN: 21 mg/dL — AB (ref 6–20)
CO2: 27 mmol/L (ref 22–32)
Calcium: 9.3 mg/dL (ref 8.9–10.3)
Chloride: 102 mmol/L (ref 101–111)
Creatinine, Ser: 1.35 mg/dL — ABNORMAL HIGH (ref 0.44–1.00)
GFR calc Af Amer: 42 mL/min — ABNORMAL LOW (ref >60)
GFR, EST NON AFRICAN AMERICAN: 37 mL/min — AB (ref >60)
Glucose, Bld: 87 mg/dL (ref 65–99)
Potassium: 4.3 mmol/L (ref 3.5–5.1)
SODIUM: 137 mmol/L (ref 135–145)

## 2016-06-15 LAB — CBC WITH DIFFERENTIAL/PLATELET
Basophils Absolute: 0.1 10*3/uL (ref 0–0.1)
Basophils Relative: 1 %
EOS ABS: 0.1 10*3/uL (ref 0–0.7)
Eosinophils Relative: 3 %
HCT: 35.6 % (ref 35.0–47.0)
Hemoglobin: 11.6 g/dL — ABNORMAL LOW (ref 12.0–16.0)
LYMPHS ABS: 0.7 10*3/uL — AB (ref 1.0–3.6)
Lymphocytes Relative: 15 %
MCH: 27.4 pg (ref 26.0–34.0)
MCHC: 32.5 g/dL (ref 32.0–36.0)
MCV: 84.2 fL (ref 80.0–100.0)
MONO ABS: 0.8 10*3/uL (ref 0.2–0.9)
MONOS PCT: 16 %
Neutro Abs: 3.3 10*3/uL (ref 1.4–6.5)
Neutrophils Relative %: 65 %
PLATELETS: 172 10*3/uL (ref 150–440)
RBC: 4.23 MIL/uL (ref 3.80–5.20)
RDW: 17.6 % — ABNORMAL HIGH (ref 11.5–14.5)
WBC: 5 10*3/uL (ref 3.6–11.0)

## 2016-06-15 LAB — PROTIME-INR
INR: 1
PROTHROMBIN TIME: 13.2 s (ref 11.4–15.2)

## 2016-06-17 DIAGNOSIS — N189 Chronic kidney disease, unspecified: Secondary | ICD-10-CM | POA: Diagnosis not present

## 2016-06-17 LAB — CBC WITH DIFFERENTIAL/PLATELET
BASOS PCT: 2 %
Basophils Absolute: 0.1 10*3/uL (ref 0–0.1)
Eosinophils Absolute: 0.2 10*3/uL (ref 0–0.7)
Eosinophils Relative: 5 %
HEMATOCRIT: 36.2 % (ref 35.0–47.0)
HEMOGLOBIN: 11.9 g/dL — AB (ref 12.0–16.0)
Lymphocytes Relative: 19 %
Lymphs Abs: 0.8 10*3/uL — ABNORMAL LOW (ref 1.0–3.6)
MCH: 27.5 pg (ref 26.0–34.0)
MCHC: 32.8 g/dL (ref 32.0–36.0)
MCV: 83.9 fL (ref 80.0–100.0)
MONOS PCT: 16 %
Monocytes Absolute: 0.6 10*3/uL (ref 0.2–0.9)
Neutro Abs: 2.3 10*3/uL (ref 1.4–6.5)
Neutrophils Relative %: 58 %
Platelets: 183 10*3/uL (ref 150–440)
RBC: 4.31 MIL/uL (ref 3.80–5.20)
RDW: 17.9 % — ABNORMAL HIGH (ref 11.5–14.5)
WBC: 3.9 10*3/uL (ref 3.6–11.0)

## 2016-06-17 LAB — BASIC METABOLIC PANEL
ANION GAP: 10 (ref 5–15)
BUN: 21 mg/dL — ABNORMAL HIGH (ref 6–20)
CALCIUM: 9.9 mg/dL (ref 8.9–10.3)
CO2: 24 mmol/L (ref 22–32)
Chloride: 102 mmol/L (ref 101–111)
Creatinine, Ser: 1.39 mg/dL — ABNORMAL HIGH (ref 0.44–1.00)
GFR, EST AFRICAN AMERICAN: 41 mL/min — AB (ref >60)
GFR, EST NON AFRICAN AMERICAN: 35 mL/min — AB (ref >60)
GLUCOSE: 94 mg/dL (ref 65–99)
POTASSIUM: 4.7 mmol/L (ref 3.5–5.1)
SODIUM: 136 mmol/L (ref 135–145)

## 2016-06-17 LAB — PROTIME-INR
INR: 1.11
Prothrombin Time: 14.4 seconds (ref 11.4–15.2)

## 2016-06-18 ENCOUNTER — Telehealth (HOSPITAL_COMMUNITY): Payer: Self-pay

## 2016-06-18 NOTE — Telephone Encounter (Signed)
Called to schedule f/u, left message for daughter to return call. AW

## 2016-06-19 ENCOUNTER — Encounter: Payer: Medicare Other | Admitting: Internal Medicine

## 2016-06-19 LAB — CBC WITH DIFFERENTIAL/PLATELET
BASOS ABS: 0.1 10*3/uL (ref 0–0.1)
BASOS PCT: 1 %
EOS ABS: 0.2 10*3/uL (ref 0–0.7)
Eosinophils Relative: 5 %
HEMATOCRIT: 35.1 % (ref 35.0–47.0)
HEMOGLOBIN: 11.6 g/dL — AB (ref 12.0–16.0)
Lymphocytes Relative: 14 %
Lymphs Abs: 0.5 10*3/uL — ABNORMAL LOW (ref 1.0–3.6)
MCH: 27.6 pg (ref 26.0–34.0)
MCHC: 32.9 g/dL (ref 32.0–36.0)
MCV: 83.9 fL (ref 80.0–100.0)
Monocytes Absolute: 0.5 10*3/uL (ref 0.2–0.9)
Monocytes Relative: 13 %
NEUTROS ABS: 2.6 10*3/uL (ref 1.4–6.5)
NEUTROS PCT: 67 %
Platelets: 175 10*3/uL (ref 150–440)
RBC: 4.19 MIL/uL (ref 3.80–5.20)
RDW: 17.6 % — ABNORMAL HIGH (ref 11.5–14.5)
WBC: 3.8 10*3/uL (ref 3.6–11.0)

## 2016-06-19 LAB — BASIC METABOLIC PANEL
ANION GAP: 9 (ref 5–15)
BUN: 25 mg/dL — AB (ref 6–20)
CALCIUM: 9.7 mg/dL (ref 8.9–10.3)
CO2: 27 mmol/L (ref 22–32)
Chloride: 103 mmol/L (ref 101–111)
Creatinine, Ser: 1.5 mg/dL — ABNORMAL HIGH (ref 0.44–1.00)
GFR calc Af Amer: 37 mL/min — ABNORMAL LOW (ref >60)
GFR, EST NON AFRICAN AMERICAN: 32 mL/min — AB (ref >60)
Glucose, Bld: 95 mg/dL (ref 65–99)
POTASSIUM: 4.2 mmol/L (ref 3.5–5.1)
SODIUM: 139 mmol/L (ref 135–145)

## 2016-06-19 LAB — PROTIME-INR
INR: 1.3
PROTHROMBIN TIME: 16.3 s — AB (ref 11.4–15.2)

## 2016-06-21 ENCOUNTER — Non-Acute Institutional Stay (SKILLED_NURSING_FACILITY): Payer: Medicare Other | Admitting: Gerontology

## 2016-06-21 DIAGNOSIS — Z5181 Encounter for therapeutic drug level monitoring: Secondary | ICD-10-CM | POA: Diagnosis not present

## 2016-06-21 DIAGNOSIS — G8918 Other acute postprocedural pain: Secondary | ICD-10-CM | POA: Diagnosis not present

## 2016-06-21 LAB — CBC WITH DIFFERENTIAL/PLATELET
Basophils Absolute: 0 10*3/uL (ref 0–0.1)
Basophils Relative: 1 %
EOS ABS: 0.2 10*3/uL (ref 0–0.7)
EOS PCT: 6 %
HCT: 34 % — ABNORMAL LOW (ref 35.0–47.0)
Hemoglobin: 11.2 g/dL — ABNORMAL LOW (ref 12.0–16.0)
LYMPHS ABS: 0.8 10*3/uL — AB (ref 1.0–3.6)
Lymphocytes Relative: 25 %
MCH: 27.5 pg (ref 26.0–34.0)
MCHC: 33 g/dL (ref 32.0–36.0)
MCV: 83.3 fL (ref 80.0–100.0)
MONO ABS: 0.5 10*3/uL (ref 0.2–0.9)
MONOS PCT: 16 %
Neutro Abs: 1.7 10*3/uL (ref 1.4–6.5)
Neutrophils Relative %: 52 %
PLATELETS: 167 10*3/uL (ref 150–440)
RBC: 4.09 MIL/uL (ref 3.80–5.20)
RDW: 17.6 % — AB (ref 11.5–14.5)
WBC: 3.3 10*3/uL — ABNORMAL LOW (ref 3.6–11.0)

## 2016-06-21 LAB — BASIC METABOLIC PANEL
Anion gap: 9 (ref 5–15)
BUN: 27 mg/dL — AB (ref 6–20)
CALCIUM: 9.8 mg/dL (ref 8.9–10.3)
CHLORIDE: 102 mmol/L (ref 101–111)
CO2: 27 mmol/L (ref 22–32)
CREATININE: 1.38 mg/dL — AB (ref 0.44–1.00)
GFR calc Af Amer: 41 mL/min — ABNORMAL LOW (ref >60)
GFR, EST NON AFRICAN AMERICAN: 36 mL/min — AB (ref >60)
Glucose, Bld: 90 mg/dL (ref 65–99)
Potassium: 4.5 mmol/L (ref 3.5–5.1)
SODIUM: 138 mmol/L (ref 135–145)

## 2016-06-21 LAB — PROTIME-INR
INR: 1.71
PROTHROMBIN TIME: 20.3 s — AB (ref 11.4–15.2)

## 2016-06-21 NOTE — Progress Notes (Signed)
SHAYLIN, BLATT ([id number]) Visit Report for 06/19/2016 Chief Complaint Document Details SUHAYLA, CHISOM Date of Service: 06/19/2016 1:30 PM Patient Name: C. Patient Account Number: [id number] Medical Record Treating RN: Cornell Barman [id number] Number: Other Clinician: [birth year] (81 y.o. Treating [name], [place] Date of Birth/Sex: Female) Physician/Extender: G Primary Care [name] Physician: Referring Physician: [name] in Treatment: 30 Information Obtained from: Patient Chief Complaint Patient here for reevaluation of her right lower externally ulcer Electronic Signature(s) Signed: 06/19/2016 5:40:41 PM By: [name] MD Entered By: [name] on 06/19/2016 14:43:39 Heney, [name] ([id number]) -------------------------------------------------------------------------------- Debridement Details [name] Date of Service: 06/19/2016 1:30 PM Patient Name: C. Patient Account Number: [id number] Medical Record Treating RN: Cornell Barman [id number] Number: Other Clinician: March 31, 1935 (81 y.o. Treating [name], [name] Date of Birth/Sex: Female) Physician/Extender: G Primary Care [name] Physician: Referring Physician: [name] in Treatment: 30 Debridement Performed for Wound #8 Right,Medial Lower Leg Assessment: Performed By: Physician [name], MD Debridement: Debridement Pre-procedure Yes - 13:50 Verification/Time Out Taken: Start Time: 13:51 Pain Control: Other : lidocaine 4% Level: Skin/Subcutaneous Tissue Total Area Debrided (L x 0.5 (cm) x 1 (cm) = 0.5 (cm) W): Tissue and other Viable, Non-Viable, Eschar, Subcutaneous material debrided: Instrument: Curette Bleeding: Minimum Hemostasis Achieved: Pressure End Time: 13:55 Procedural Pain: 3 Post Procedural Pain: 3 Response to Treatment: Procedure was tolerated well Post Debridement Measurements of Total Wound Length: (cm) 0.5 Width: (cm)  1.1 Depth: (cm) 0.2 Volume: (cm) 0.086 Character of Wound/Ulcer Post Requires Further Debridement Debridement: Severity of Tissue Post Debridement: Fat layer exposed Post Procedure Diagnosis Same as Pre-procedure Electronic Signature(s) PHENIX, GREIN ([id number]) Signed: 06/19/2016 5:40:41 PM By: [name] MD Signed: 06/20/2016 6:03:08 PM By: Gretta Cool RN, BSN, Kim RN, BSN Entered By: [name] on 06/19/2016 14:43:28 Chokshi, [name] ([id number]) -------------------------------------------------------------------------------- HPI Details [name] Date of Service: 06/19/2016 1:30 PM Patient Name: C. Patient Account Number: [id number] Medical Record Treating RN: Cornell Barman [id number] Number: Other Clinician: 28-Aug-1934 (81 y.o. Treating [name], [place] Date of Birth/Sex: Female) Physician/Extender: G [place] Physician: Referring Physician: [name] in Treatment: 30 History of Present Illness HPI Description: 11/22/15; this is [place] is a 80 year old woman who lives at home on her own. According to the patient and her daughter was present she has had long-standing edema in her legs dating back many years. She also has a history of chronic systolic heart failure, atrial fibrillation and is status post mitral valve replacement. Last echocardiogram I see [facility] showed an ejection fraction of 40-45% she is on Lasix 60 mg a day and spironolactone 25 mg a day. Her current problem began in December around Christmas time she developed a small hematoma in the medial part of her left leg which rapidly expanded to a very large hematoma that required surgical debridement. This situation was complicated by the fact that the patient is on long-standing Coumadin for mechanical heart valve. She went to the OR had this evacuated on January 4 /17. The wound has gradually improved however she has developed a small wounds around  this area and more recently a wound on the right lateral leg. She is weeping edema fluid. The patient is already been to see vascular surgery. It was recommended that she wear Unna boots, she did not tolerate this due to pain in the left leg. She was then prescribed Juzo stockings and really can't get these on herself although truthfully there is probably too much edema for  a Juzo stockings currently. She is not a diabetic and has no history of PAD or claudication that I could elicit. She does not use the external compression pumps reliably. She comes today with notes from her primary physician and Dr. Ronalee Belts both recommending various forms of compression but the patient does not really complied with them. Has been using the external compression pumps with some regularity but certainly not daily on the right leg and this has helped. I also note that her daughter tells me the history that she does not sleep in bed at home. She has a hospital bed but with her legs up she finds this painful so she sleeps in the couch sitting up with her legs dependent. 11/29/15; the patient is arrives today accompanied by her son. He expresses satisfaction that she is maintained the compression all week. 12/06/15; the patient has kept her Profore light wraps on, we have good edema control no major change in the wounds we have been using Aquacel 12/13/15; changed to RTD last week. One of the 3 wounds on her left medial leg is healed she has 2 remaining wounds here and one on the right lateral leg. 12/18/2015 -- the patient was at Dr. Nino Parsley office today and he was seeing her for an arterial study. While the wrap was being removed she had an inadvertent laceration of the left proximal anterior leg which bled quite profusely and a compression dressing was applied over this and the patient was here to get her Profore wraps done. I was asked to see the patient to make an assessment and treat appropriately. 12/26/15; the  patient's injury on the left proximal leg and Steri-Strips removed after soaking. There is an open area here. The original wounds to still open on the right lateral and left medial leg. 01/03/16 patient's injury on her proximal left leg looks quite good. Still small open area on the medial left leg which appears to be improving. The area on the right lateral leg still substantially open with no real improvement in wound depth. Her edema control is marginal with a Profore light. We have been using RTD Wendell, Aerika C. ([id number]) for 3-4 weeks without any major change 01/10/16: wounds without s/s of infection. vascular results are pending regarding arterial studies. 01/17/16; patient comes in today complaining of severe pain however I think most of this is in the right hip not related to her wounds. She continues with a oval-shaped wound on the right lateral leg, trauma to the left anterior leg just below her tibial plateau. She has a smaller eschar on the left anterior leg. She is being using Prisma however she informs [place] today that she is actually allergic to silver, nose this from a previous application at [facility] some years ago 01/24/16; edema is not so well controlled today, I think I reduced her to [place] bilaterally last week. The area which was a scissor injury on her left upper anterior lower leg is fully epithelialized. She only has a small open area remaining on the medial aspect of her left leg. The oval-shaped wound on the right mid lateral leg may be a bit smaller. Debrided of surface slough nonviable subcutaneous tissue. I had changed to collagen 2 weeks ago in an attempt to get this to close 01/31/16 all the patient's wounds on her left leg give healed. We have good edema control with bilateral Profore lites which she has been compliant with. She still has the oval-shaped wound on the right lateral  calf allergic even this appears to be slowly improving. The patient has juxtalite  stockings at home. She states she thinks she can put these on. She also has external compression pumps at home although I think her compliance with this has not been good in the past. She complains today of edema in her thighs. Tells me she takes 40 mg of Lasix daily 02/21/16; only 1 small wound remains on the lateral aspect of the right calf. She is using Juzo stockings on the left leg although she complains about difficulty in applying them. She has external compression pumps at home 02/28/16; the small open wound on her right lateral calf is improved in terms of wound area. It appears that she has a wrap injury on the anterior aspect of the upper leg 03/06/16; the small open wound on her right lateral calf has a very small open area remaining. The wrap injury on the anterior aspect of the upper leg also appears better. She arrives today in clinic with a history of dyspnea with minimal exertion starting with the last 2-4 days. She does not describe chest pain. Her son and our intake nurse think she has facial swelling. She has a history of an artificial mitral valve on Coumadin 03/13/16; the small wound on the right lateral calf is no better this week. The superior wrap injury anteriorly requires debridement of surface eschar and nonviable subcutaneous tissue. She has been to her primary doctor with regards to her dyspnea we identified last week. Per the patient's description her Lasix has been increased 03/21/2016 -- patient of Dr. Dellia Nims who could not keep her appointment yesterday but has come in today with the right lower leg looking good and this is the leg which has been treated in the recent past. However, her left lower extremity is extremely swollen, tender and edematous with redness and discoloration. 03/27/16; there is only 2 small areas remain on the right leg. The edema that was so concerning last week has come down however there is extensive bruising on the lateral left leg medial  left foot suggesting that she lost some blood into the leg itself. I checked her hemoglobin on 9/21 was 14.5. Her INR was over 6. Duplex ultrasound was negative for DVT 04/03/16 at this point in time today patient is actually doing substantially better in regard to the wound on the anterior right lower extremity. currently there is no slough or eschar noted and no evidence of erythema, discharge, or local infection. She is exhibiting no signs of systemic infection. She is tolerating the dressing changes as well as the wrapping at this point in time. 04/24/16; I have not seen Mrs. Mielke for 3 weeks. Apparently she was admitted to hospital for respiratory issues/also apparently has had multiple compression fractures and had kyphoplasty. When she was last here she only had one small open area remaining on the left leg she was using juxtalite stockings. Her son is upset about restarting the Coumadin which she blames or the swelling in her legs. She is on Coumadin for prophylaxis with a chronic artificial heart valve. DONYE, DAUENHAUER ([id number]) 04/30/16 at this point in time patient has been tolerating the 3 layer compression wrap as well as the calcium alginate. 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 With tthat being said she does have the continued wound of the left lower extremity as well as the right medial lower extremity. The right side is significantly smaller compared to the left but actually is slough  covered. fortunately she has no significant tenderness at rest although with manipulation of the right location of the wound this is significantly tender. 05/07/16 for follow-up evaluation today both the patient's wounds appear to be significantly improved in size compared to last week. She is also having less pain at both locations currently. Her pain level at most is related to be a 1-2 out of 10. She does have some discharge but fortunately no evidence of infection at  this point.she also continues to tolerate the compression wraps well. 05/14/16; the patient's wound on the left leg actually looks as though it's on its way to closure. She is using collagen to this area. She has a small punched out area over the right medial calf. The cause of this is not really clear it has probably 0.4 cm of depth. She has an IV in her right hand which she states is for IV fluid when she develops low sodiums her potassiums, the etiology of this is not clear 05-21-16 she presents today with continued ulcerations the right lower extremity, posterior aspect. she has been using Iodoflex and compression therapy to the area. She denies any new injuries or trauma. She does have 2 areas proximal to this ulceration of dry crust along with an area to her left posterior lower leg that is purple discolored area appears similar to how her current ulceration started, she denies any trauma or injury to the left posterior leg we will monitor all of these areas. 05/28/16; the patient is down to 1 small open area on the right medial mid calf. This is however larger and in 50% of the surface area deeper approximately 0.4 cm. The reason for this deterioration is unclear. I've gone ahead and done a culture of this area she also tells [place] today that she is short of breath. She is also noted a swelling on her lower left eyelid 06/05/16; the patient is 1 small open area on the right medial mid calf. This looks about the same as last week. The deep area, medial 50% of the wound looks about the same. CandS I did last week was negative. The entire area looks about the same 06/12/16; not much change in this over the course of the last week. Patient comes in today complaining of extreme back pain. Apparently she has either a kyphoplasty or vertebral plasty scheduled at [facility] radiology tomorrow. For this reason no debridement today. We have been using Iodoflex 06/19/16; patient arrived with the surface  eschar from last week removed with a curet debrided of subcutaneous tissue. She tolerates this reasonably well. She is still in a skilled facility. States her vertebral plasty last week is made her pain bearable Electronic Signature(s) Signed: 06/19/2016 5:40:41 PM By: [name] MD Entered By: [name] on 06/19/2016 14:45:32 Arkwright, [name] ([id number]) -------------------------------------------------------------------------------- Physical Exam Details [name] Date of Service: 06/19/2016 1:30 PM Patient Name: C. Patient Account Number: [id number] Medical Record Treating RN: Cornell Barman [id number] Number: Other Clinician: 02-23-1935 (81 y.o. Treating [name], [name] Date of Birth/Sex: Female) Physician/Extender: G Primary Care [name] Physician: Referring Physician: [name] in Treatment: 30 Constitutional Sitting or standing Blood Pressure is within target range for patient.. Pulse regular and within target range for patient.Marland Kitchen Respirations regular, non-labored and within target range.. Patient's appearance is neat and clean. Appears in no acute distress. Well nourished and well developed.. Notes Wound exam; the patient has a small open area on the right medial calf. This was covered in  a thick black eschar removed with a #3 curet nonviable tissue removed underneath this as well. Post debridement she has a healthy appearing base but with some relative depth of the wound. There is no evidence of surrounding infection Electronic Signature(s) Signed: 06/19/2016 5:40:41 PM By: [name] MD Entered By: [name] on 06/19/2016 14:46:31 Bednarz, [name] ([id number]) -------------------------------------------------------------------------------- Physician Orders Details [name] Date of Service: 06/19/2016 1:30 PM Patient Name: C. Patient Account Number: [id number] Medical Record Treating RN: Cornell Barman [id number] Number: Other Clinician: 12/13/1934 (81 y.o. Treating [name], [name] Date of Birth/Sex: Female) Physician/Extender: G Primary Care [name] Physician: Referring Physician: [name] in Treatment: 30 Verbal / Phone Orders: Yes Clinician: Cornell Barman Read Back and Verified: Yes Diagnosis Coding Wound Cleansing Wound #8 Right,Medial Lower Leg o Clean wound with Normal Saline. o Cleanse wound with mild soap and water o No tub bath. - give sink or bed baths Anesthetic Wound #8 Right,Medial Lower Leg o Topical Lidocaine 4% cream applied to wound bed prior to debridement - for clinic use Skin Barriers/Peri-Wound Care Wound #8 Right,Medial Lower Leg o Moisturizing lotion Primary Wound Dressing Wound #8 Right,Medial Lower Leg o Promogran Secondary Dressing Wound #8 Right,Medial Lower Leg o Dry Gauze Dressing Change Frequency Wound #8 Right,Medial Lower Leg o Other: - Saturday when patient takes shower. Follow-up Appointments Wound #8 Right,Medial Lower Leg o Return Appointment in 1 week. Edema Control Nimmons, Linnette C. ([id number]) Wound #8 Right,Medial Lower Leg o Kerlix and Coban - Right Lower Extremity o Patient to wear own compression stockings - left Additional Orders / Instructions Wound #8 Right,Medial Lower Leg o Increase protein intake. Electronic Signature(s) Signed: 06/19/2016 5:40:41 PM By: [name] MD Signed: 06/20/2016 6:03:08 PM By: Gretta Cool RN, BSN, Kim RN, BSN Entered By: Gretta Cool, RN, BSN, Kim on 06/19/2016 14:20:41 Lingerfelt, [name] ([id number]) -------------------------------------------------------------------------------- Problem List Details [name] Date of Service: 06/19/2016 1:30 PM Patient Name: C. Patient Account Number: [id number] Medical Record Treating RN: Cornell Barman [id number] Number: Other Clinician: Mar 30, 1935 (81 y.o. Treating [name], [name] Date of  Birth/Sex: Female) Physician/Extender: G Primary Care [name] Physician: Referring Physician: [name] in Treatment: 30 Active Problems ICD-10 Encounter Code Description Active Date Diagnosis L97.812 Non-pressure chronic ulcer of other part of right lower leg 04/30/2016 Yes with fat layer exposed I87.333 Chronic venous hypertension (idiopathic) with ulcer and 04/03/2016 Yes inflammation of bilateral lower extremity I89.0 Lymphedema, not elsewhere classified 04/03/2016 Yes J67.34 Chronic systolic (congestive) heart failure 04/03/2016 Yes Inactive Problems Resolved Problems ICD-10 Code Description Active Date Resolved Date S81.812S Laceration without foreign body, left lower leg, sequela 04/03/2016 04/03/2016 Electronic Signature(s) Signed: 06/19/2016 5:40:41 PM By: [name] MD Entered By: [name] on 06/19/2016 14:43:06 Garrett, [name] ([id number]) -------------------------------------------------------------------------------- Progress Note Details [name] Date of Service: 06/19/2016 1:30 PM Patient Name: C. Patient Account Number: [id number] Medical Record Treating RN: Cornell Barman [id number] Number: Other Clinician: [birth year] (81 y.o. Treating [name], [place] Date of Birth/Sex: Female) Physician/Extender: G Primary Care [name] Physician: Referring Physician: [name] in Treatment: 30 Subjective Chief Complaint Information obtained from Patient Patient here for reevaluation of her right lower externally ulcer History of Present Illness (HPI) 11/22/15; this is [place] is a 81 year old woman who lives at home on her own. According to the patient and her daughter was present she has had long-standing edema in her legs dating back many years. She also has a history of chronic systolic heart failure, atrial fibrillation and is status post  mitral valve replacement. Last echocardiogram I see in [facility] showed an  ejection fraction of 40-45% she is on Lasix 60 mg a day and spironolactone 25 mg a day. Her current problem began in December around Christmas time she developed a small hematoma in the medial part of her left leg which rapidly expanded to a very large hematoma that required surgical debridement. This situation was complicated by the fact that the patient is on long-standing Coumadin for mechanical heart valve. She went to the OR had this evacuated on January 4 /17. The wound has gradually improved however she has developed a small wounds around this area and more recently a wound on the right lateral leg. She is weeping edema fluid. The patient is already been to see vascular surgery. It was recommended that she wear Unna boots, she did not tolerate this due to pain in the left leg. She was then prescribed Juzo stockings and really can't get these on herself although truthfully there is probably too much edema for a Juzo stockings currently. She is not a diabetic and has no history of PAD or claudication that I could elicit. She does not use the external compression pumps reliably. She comes today with notes from her primary physician and Dr. Ronalee Belts both recommending various forms of compression but the patient does not really complied with them. Has been using the external compression pumps with some regularity but certainly not daily on the right leg and this has helped. I also note that her daughter tells me the history that she does not sleep in bed at home. She has a hospital bed but with her legs up she finds this painful so she sleeps in the couch sitting up with her legs dependent. 11/29/15; the patient is arrives today accompanied by her son. He expresses satisfaction that she is maintained the compression all week. 12/06/15; the patient has kept her Profore light wraps on, we have good edema control no major change in the wounds we have been using Aquacel 12/13/15; changed to RTD last  week. One of the 3 wounds on her left medial leg is healed she has 2 remaining wounds here and one on the right lateral leg. 12/18/2015 -- the patient was at Dr. Nino Parsley office today and he was seeing her for an arterial study. While the wrap was being removed she had an inadvertent laceration of the left proximal anterior leg which bled quite profusely and a compression dressing was applied over this and the patient was here to get her Profore wraps done. I was asked to see the patient to make an assessment and treat appropriately. IDY, RAWLING ([id number]) 12/26/15; the patient's injury on the left proximal leg and Steri-Strips removed after soaking. There is an open area here. The original wounds to still open on the right lateral and left medial leg. 01/03/16 patient's injury on her proximal left leg looks quite good. Still small open area on the medial left leg which appears to be improving. The area on the right lateral leg still substantially open with no real improvement in wound depth. Her edema control is marginal with a Profore light. We have been using RTD for 3-4 weeks without any major change 01/10/16: wounds without s/s of infection. vascular results are pending regarding arterial studies. 01/17/16; patient comes in today complaining of severe pain however I think most of this is in the right hip not related to her wounds. She continues with a oval-shaped wound on the right  lateral leg, trauma to the left anterior leg just below her tibial plateau. She has a smaller eschar on the left anterior leg. She is being using Prisma however she informs [place] today that she is actually allergic to silver, nose this from a previous application at [facility] some years ago 01/24/16; edema is not so well controlled today, I think I reduced her to [place] bilaterally last week. The area which was a scissor injury on her left upper anterior lower leg is fully epithelialized. She only has a small  open area remaining on the medial aspect of her left leg. The oval-shaped wound on the right mid lateral leg may be a bit smaller. Debrided of surface slough nonviable subcutaneous tissue. I had changed to collagen 2 weeks ago in an attempt to get this to close 01/31/16 all the patient's wounds on her left leg give healed. We have good edema control with bilateral Profore lites which she has been compliant with. She still has the oval-shaped wound on the right lateral calf allergic even this appears to be slowly improving. The patient has juxtalite stockings at home. She states she thinks she can put these on. She also has external compression pumps at home although I think her compliance with this has not been good in the past. She complains today of edema in her thighs. Tells me she takes 40 mg of Lasix daily 02/21/16; only 1 small wound remains on the lateral aspect of the right calf. She is using Juzo stockings on the left leg although she complains about difficulty in applying them. She has external compression pumps at home 02/28/16; the small open wound on her right lateral calf is improved in terms of wound area. It appears that she has a wrap injury on the anterior aspect of the upper leg 03/06/16; the small open wound on her right lateral calf has a very small open area remaining. The wrap injury on the anterior aspect of the upper leg also appears better. She arrives today in clinic with a history of dyspnea with minimal exertion starting with the last 2-4 days. She does not describe chest pain. Her son and our intake nurse think she has facial swelling. She has a history of an artificial mitral valve on Coumadin 03/13/16; the small wound on the right lateral calf is no better this week. The superior wrap injury anteriorly requires debridement of surface eschar and nonviable subcutaneous tissue. She has been to her primary doctor with regards to her dyspnea we identified last week. Per the  patient's description her Lasix has been increased 03/21/2016 -- patient of Dr. Dellia Nims who could not keep her appointment yesterday but has come in today with the right lower leg looking good and this is the leg which has been treated in the recent past. However, her left lower extremity is extremely swollen, tender and edematous with redness and discoloration. 03/27/16; there is only 2 small areas remain on the right leg. The edema that was so concerning last week has come down however there is extensive bruising on the lateral left leg medial left foot suggesting that she lost some blood into the leg itself. I checked her hemoglobin on 9/21 was 14.5. Her INR was over 6. Duplex ultrasound was negative for DVT 04/03/16 at this point in time today patient is actually doing substantially better in regard to the wound on the anterior right lower extremity. currently there is no slough or eschar noted and no evidence of erythema, discharge,  or local infection. She is exhibiting no signs of systemic infection. She is tolerating the dressing changes as well as the wrapping at this point in time. 04/24/16; I have not seen Mrs. Simerly for 3 weeks. Apparently she was admitted to hospital for respiratory REGINE, CHRISTIAN. ([id number]) issues/also apparently has had multiple compression fractures and had kyphoplasty. When she was last here she only had one small open area remaining on the left leg she was using juxtalite stockings. Her son is upset about restarting the Coumadin which she blames or the swelling in her legs. She is on Coumadin for prophylaxis with a chronic artificial heart valve. 04/30/16 at this point in time patient has been tolerating the 3 layer compression wrap as well as the calcium alginate. 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 With tthat being said she does have the continued wound of the left lower extremity as well as the right medial lower extremity. The right side  is significantly smaller compared to the left but actually is slough covered. fortunately she has no significant tenderness at rest although with manipulation of the right location of the wound this is significantly tender. 05/07/16 for follow-up evaluation today both the patient's wounds appear to be significantly improved in size compared to last week. She is also having less pain at both locations currently. Her pain level at most is related to be a 1-2 out of 10. She does have some discharge but fortunately no evidence of infection at this point.she also continues to tolerate the compression wraps well. 05/14/16; the patient's wound on the left leg actually looks as though it's on its way to closure. She is using collagen to this area. She has a small punched out area over the right medial calf. The cause of this is not really clear it has probably 0.4 cm of depth. She has an IV in her right hand which she states is for IV fluid when she develops low sodiums her potassiums, the etiology of this is not clear 05-21-16 she presents today with continued ulcerations the right lower extremity, posterior aspect. she has been using Iodoflex and compression therapy to the area. She denies any new injuries or trauma. She does have 2 areas proximal to this ulceration of dry crust along with an area to her left posterior lower leg that is purple discolored area appears similar to how her current ulceration started, she denies any trauma or injury to the left posterior leg we will monitor all of these areas. 05/28/16; the patient is down to 1 small open area on the right medial mid calf. This is however larger and in 50% of the surface area deeper approximately 0.4 cm. The reason for this deterioration is unclear. I've gone ahead and done a culture of this area she also tells [place] today that she is short of breath. She is also noted a swelling on her lower left eyelid 06/05/16; the patient is 1 small open  area on the right medial mid calf. This looks about the same as last week. The deep area, medial 50% of the wound looks about the same. CandS I did last week was negative. The entire area looks about the same 06/12/16; not much change in this over the course of the last week. Patient comes in today complaining of extreme back pain. Apparently she has either a kyphoplasty or vertebral plasty scheduled at [facility] radiology tomorrow. For this reason no debridement today. We have been using Iodoflex 06/19/16; patient  arrived with the surface eschar from last week removed with a curet debrided of subcutaneous tissue. She tolerates this reasonably well. She is still in a skilled facility. States her vertebral plasty last week is made her pain bearable Objective Constitutional Sitting or standing Blood Pressure is within target range for patient.. Pulse regular and within target range for patient.Marland Kitchen Respirations regular, non-labored and within target range.. Patient's appearance is neat and clean. Appears in no acute distress. Well nourished and well developed.[name], Tonye Loletha Grayer ([id number]) Vitals Time Taken: 1:39 PM, Height: 68 in, Weight: 178 lbs, BMI: 27.1, Temperature: 97.6 F, Pulse: 91 bpm, Respiratory Rate: 16 breaths/min, Blood Pressure: 129/62 mmHg. General Notes: Wound exam; the patient has a small open area on the right medial calf. This was covered in a thick black eschar removed with a #3 curet nonviable tissue removed underneath this as well. Post debridement she has a healthy appearing base but with some relative depth of the wound. There is no evidence of surrounding infection Integumentary (Hair, Skin) Wound #8 status is Open. Original cause of wound was Trauma. The wound is located on the Right,Medial Lower Leg. The wound measures 0.5cm length x 1cm width x 0.1cm depth; 0.393cm^2 area and 0.039cm^3 volume. The wound is limited to skin breakdown. There is no tunneling or  undermining noted. There is a small amount of serosanguineous drainage noted. The wound margin is distinct with the outline attached to the wound base. There is no granulation within the wound bed. There is a large (67-100%) amount of necrotic tissue within the wound bed including Eschar. The periwound skin appearance exhibited: Dry/Scaly. Periwound temperature was noted as No Abnormality. The periwound has tenderness on palpation. 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 There was a Skin/Subcutaneous Tissue Debridement ([id number]) debridement with total area of 0.5 sq cm performed by [name], MD. with the following instrument(s): Curette to remove Viable and Non-Viable tissue/material including Eschar and Subcutaneous after achieving pain control using Other (lidocaine 4%). A time out was conducted at 13:50, prior to the start of the procedure. A Minimum amount of bleeding was controlled with Pressure. The procedure was tolerated well with a pain level of 3 throughout and a pain level of 3 following the procedure. Post Debridement Measurements: 0.5cm length x 1.1cm width x 0.2cm depth; 0.086cm^3 volume. Character of Wound/Ulcer Post Debridement requires further debridement. Severity of Tissue Post Fern, Keyia C. ([id number]) Debridement is: Fat layer exposed. Post procedure Diagnosis Wound #8: Same as Pre-Procedure Plan Wound Cleansing: Wound #8 Right,Medial Lower Leg: Clean wound with Normal Saline. Cleanse wound with mild soap and water No tub bath. - give sink or bed  baths Anesthetic: Wound #8 Right,Medial Lower Leg: Topical Lidocaine 4% cream applied to wound bed prior to debridement - for clinic use Skin Barriers/Peri-Wound Care: Wound #8 Right,Medial Lower Leg: Moisturizing lotion Primary Wound Dressing: Wound #8 Right,Medial Lower Leg: Promogran Secondary Dressing: Wound #8 Right,Medial Lower Leg: Dry Gauze Dressing Change Frequency: Wound #8 Right,Medial Lower Leg: Other: - Saturday when patient takes shower. Follow-up Appointments: Wound #8 Right,Medial Lower Leg: Return Appointment in 1 week. Edema Control: Wound #8 Right,Medial Lower Leg: Kerlix and  Coban - Right Lower Extremity Patient to wear own compression stockings - left Additional Orders / Instructions: Wound #8 Right,Medial Lower Leg: Increase protein intake. #1 Promogran, dry gauze, she uses her own compression stocking which seems to control the edema. This can be changed in the facility SYVILLA, MARTIN ([id number]) Electronic Signature(s) Signed: 06/19/2016 5:40:41 PM By: [name] MD Entered By: [name] on 06/19/2016 14:47:29 Odeh, [name] ([id number]) -------------------------------------------------------------------------------- SuperBill Details Patient Name: [name] Date of Service: 06/19/2016 Medical Record Patient Account Number: [id number] Number: Treating RN: Cornell Barman January 29, 1935 (81 y.o. Other Clinician: Date of Birth/Sex: Female) Treating [name], [name] Primary Care Physician/Extender: Claudette Laws Physician: Suella Grove in Treatment: 30 Referring Physician: [name] Diagnosis Coding ICD-10 Codes Code Description [phone] Non-pressure chronic ulcer of other part of right lower leg with fat layer exposed Chronic venous hypertension (idiopathic) with ulcer and inflammation of bilateral lower I87.333 extremity I89.0 Lymphedema, not elsewhere classified D53.29 Chronic systolic (congestive) heart  failure Facility Procedures CPT4: Description Modifier Quantity Code [id number] 11042 - DEB SUBQ TISSUE 20 SQ CM/< 1 ICD-10 Description Diagnosis L97.812 Non-pressure chronic ulcer of other part of right lower leg with fat layer exposed I87.333 Chronic venous hypertension  (idiopathic) with ulcer and inflammation of bilateral lower extremity Physician Procedures CPT4: Description Modifier Quantity Code [id number] - WC PHYS SUBQ TISS 20 SQ CM 1 ICD-10 Description Diagnosis L97.812 Non-pressure chronic ulcer of other part of right lower leg with fat layer exposed I87.333 Chronic venous hypertension (idiopathic)  with ulcer and inflammation of bilateral lower extremity Electronic Signature(s) Signed: 06/19/2016 5:40:41 PM By: [name] MD [name] ([id number]) Entered By: [name] on 06/19/2016 14:47:43

## 2016-06-21 NOTE — Progress Notes (Signed)
GWENDOLIN, BRIEL ([id number]) Visit Report for 06/19/2016 Arrival Information Details Patient Name: [name], DEBNAM. Date of Service: 06/19/2016 1:30 PM Medical Record Patient Account Number: [id number] Number: Treating RN: Cornell Barman [birth year] (81 y.o. Other Clinician: Date of Birth/Sex: Female) Treating [name], [name] Primary Care Physician/Extender: Claudette Laws Physician: Referring Physician: [name] in Treatment: 43 Visit Information History Since Last Visit Added or deleted any medications: No Patient Arrived: Wheel Chair Any new allergies or adverse reactions: No Arrival Time: 13:38 Had a fall or experienced change in No Accompanied By: caregiver activities of daily living that may affect Transfer Assistance: Manual risk of falls: Patient Identification Verified: Yes Signs or symptoms of abuse/neglect since last No Secondary Verification Process Yes visito Completed: Hospitalized since last visit: No Patient Requires Transmission- No Has Dressing in Place as Prescribed: No Based Precautions: Pain Present Now: No Patient Has Alerts: Yes Patient Alerts: Patient on Blood Thinner [place]!!! Electronic Signature(s) Signed: 06/20/2016 6:03:08 PM By: Gretta Cool, RN, BSN, Kim RN, BSN Entered By: Gretta Cool, RN, BSN, Kim on 06/19/2016 14:19:05 [name], [name] ([id number]) -------------------------------------------------------------------------------- Encounter Discharge Information Details Patient Name: Carrie Mulligan C. Date of Service: 06/19/2016 1:30 PM Medical Record Patient Account Number: [id number] Number: Treating RN: Cornell Barman 1934/11/02 (81 y.o. Other Clinician: Date of Birth/Sex: Female) Treating [name], [name] Primary Care Physician/Extender: Claudette Laws Physician: Referring Physician: [name] in Treatment: 30 Encounter Discharge Information Items Discharge  Pain Level: 0 Discharge Condition: Stable Ambulatory Status: Wheelchair Discharge Destination: Home Private Transportation: Auto Accompanied By: caregiver Schedule Follow-up Appointment: Yes Medication Reconciliation completed and Yes provided to Patient/Care Lou Irigoyen: Clinical Summary of Care: Electronic Signature(s) Signed: 06/20/2016 6:03:08 PM By: Gretta Cool RN, BSN, Kim RN, BSN Previous Signature: 06/19/2016 2:14:11 PM Version By: Ruthine Dose Entered By: Gretta Cool RN, BSN, Kim on 06/19/2016 14:14:29 [name], [name] ([id number]) -------------------------------------------------------------------------------- Lower Extremity Assessment Details Patient Name: [name], [name] Date of Service: 06/19/2016 1:30 PM Medical Record Patient Account Number: [id number] Number: Treating RN: Cornell Barman 08/11/1934 (81 y.o. Other Clinician: Date of Birth/Sex: Female) Treating [name], [name] Primary Care Physician/Extender: Claudette Laws Physician: Referring Physician: [name] in Treatment: 30 Edema Assessment Assessed: [Left: No] [Right: No] E[Left: dema] [Right: :] Calf Left: Right: Point of Measurement: 34 cm From Medial Instep cm 31.9 cm Ankle Left: Right: Point of Measurement: 10 cm From Medial Instep cm 22.5 cm Vascular Assessment Pulses: Posterior Tibial Popliteal Doppler Audible: [Right:Yes] Extremity colors, hair growth, and conditions: Extremity Color: [Right:Mottled] Hair Growth on Extremity: [Right:No] Temperature of Extremity: [Right:Cool] Capillary Refill: [Right:< 3 seconds] Dependent Rubor: [Right:No] Blanched when Elevated: [Right:No] Lipodermatosclerosis: [Right:No] Toe Nail Assessment Left: Right: Thick: No Discolored: No Deformed: No Improper Length and Hygiene: No [name] ([id number]) Electronic Signature(s) Signed: 06/20/2016 6:03:08 PM By: Gretta Cool, RN, BSN, Kim RN, BSN Entered By: Gretta Cool, RN, BSN, Kim on  06/19/2016 13:44:58 [place], [name] ([id number]) -------------------------------------------------------------------------------- Multi Wound Chart Details Patient Name: Carrie Mulligan C. Date of Service: 06/19/2016 1:30 PM Medical Record Patient Account Number: [id number] Number: Treating RN: Cornell Barman [birth year] (81 y.o. Other Clinician: Date of Birth/Sex: Female) Treating [name], [name] Primary Care Physician/Extender: Claudette Laws Physician: Referring Physician: [name] in Treatment: 30 Vital Signs Height(in): 68 Pulse(bpm): 91 Weight(lbs): 178 Blood Pressure 129/62 (mmHg): Body Mass Index(BMI): 27 Temperature(F): 97.6 Respiratory Rate 16 (breaths/min): Photos: [8:No Photos] [N/A:N/A] Wound Location: [8:Right Lower Leg - Medial] [N/A:N/A] Wounding Event: [8:Trauma] [N/A:N/A] Primary Etiology: [8:Venous  Leg Ulcer] [N/A:N/A] Comorbid History: [8:Cataracts, Arrhythmia, Hypotension, Osteoarthritis] [N/A:N/A] Date Acquired: [8:04/06/2016] [N/A:N/A] Weeks of Treatment: [8:8] [N/A:N/A] Wound Status: [8:Open] [N/A:N/A] Measurements L x W x D 0.5x1x0.1 [N/A:N/A] (cm) Area (cm) : [8:0.393] [N/A:N/A] Volume (cm) : [8:0.039] [N/A:N/A] % Reduction in Area: [8:-78.60%] [N/A:N/A] % Reduction in Volume: -77.30% [N/A:N/A] Classification: [8:Partial Thickness] [N/A:N/A] Exudate Amount: [8:Small] [N/A:N/A] Exudate Type: [8:Serosanguineous] [N/A:N/A] Exudate Color: [8:red, brown] [N/A:N/A] Wound Margin: [8:Distinct, outline attached] [N/A:N/A] Granulation Amount: [8:None Present (0%)] [N/A:N/A] Necrotic Amount: [8:Large (67-100%)] [N/A:N/A] Necrotic Tissue: [8:Eschar] [N/A:N/A] Exposed Structures: [8:Fascia: No Fat: No Tendon: No] [N/A:N/A] Muscle: No Joint: No Bone: No Limited to Skin Breakdown Epithelialization: None N/A N/A Debridement: Debridement ([id number]- N/A N/A [id number]) Pre-procedure 13:50 N/A N/A Verification/Time  Out Taken: Pain Control: Other N/A N/A Tissue Debrided: Necrotic/Eschar, N/A N/A Subcutaneous Level: Skin/Subcutaneous N/A N/A Tissue Debridement Area (sq 0.5 N/A N/A cm): Instrument: Curette N/A N/A Bleeding: Minimum N/A N/A Hemostasis Achieved: Pressure N/A N/A Procedural Pain: 3 N/A N/A Post Procedural Pain: 3 N/A N/A Debridement Treatment Procedure was tolerated N/A N/A Response: well Post Debridement 0.5x1.1x0.2 N/A N/A Measurements L x W x D (cm) Post Debridement 0.086 N/A N/A Volume: (cm) Periwound Skin Texture: No Abnormalities Noted N/A N/A Periwound Skin Dry/Scaly: Yes N/A N/A Moisture: Periwound Skin Color: No Abnormalities Noted N/A N/A Temperature: No Abnormality N/A N/A Tenderness on Yes N/A N/A Palpation: Wound Preparation: Ulcer Cleansing: N/A N/A Rinsed/Irrigated with Saline Topical Anesthetic Applied: Other: lidocaine 4% Procedures Performed: Debridement N/A N/A Treatment Notes Wound #8 (Right, Medial Lower Leg) 1. Cleansed with: EUGENIE, HAREWOOD C. ([id number]) Clean wound with Normal Saline 2. Anesthetic Topical Lidocaine 4% cream to wound bed prior to debridement 3. Peri-wound Care: Moisturizing lotion 4. Dressing Applied: Promogran 5. Secondary Dressing Applied Gauze and Kerlix/Conform Notes kerlix and coban Electronic Signature(s) Signed: 06/19/2016 5:40:41 PM By: [name] MD Entered By: [name] on 06/19/2016 14:43:14 Ayub, [name] ([id number]) -------------------------------------------------------------------------------- Multi-Disciplinary Care Plan Details Patient Name: FARYN, SIEG. Date of Service: 06/19/2016 1:30 PM Medical Record Patient Account Number: [id number] Number: Treating RN: Cornell Barman 1935/04/02 (80 y.o. Other Clinician: Date of Birth/Sex: Female) Treating [name], [name] Primary Care Physician/Extender: Claudette Laws Physician: Referring Physician: [name] in Treatment: 70 Active Inactive Orientation to the Wound Care Program Nursing Diagnoses: Knowledge deficit related to the wound healing center program Goals: Patient/caregiver will verbalize understanding of the [facility] Date Initiated: 11/22/2015 Goal Status: Active Interventions: Provide education on orientation to the wound center Notes: Venous Leg Ulcer Nursing Diagnoses: Knowledge deficit related to disease process and management Potential for venous Insuffiency (use before diagnosis confirmed) Goals: Patient will maintain optimal edema control Date Initiated: 11/22/2015 Goal Status: Active Patient/caregiver will verbalize understanding of disease process and disease management Date Initiated: 11/22/2015 Goal Status: Active Verify adequate tissue perfusion prior to therapeutic compression application Date Initiated: 11/22/2015 Goal Status: Active Interventions: Assess peripheral edema status every visit. [name], [name] ([id number]) Compression as ordered Provide education on venous insufficiency Treatment Activities: Non-invasive vascular studies : 11/22/2015 Therapeutic compression applied : 11/22/2015 Notes: Wound/Skin Impairment Nursing Diagnoses: Impaired tissue integrity Knowledge deficit related to ulceration/compromised skin integrity Goals: Patient/caregiver will verbalize understanding of skin care regimen Date Initiated: 11/22/2015 Goal Status: Active Ulcer/skin breakdown will have a volume reduction of 30% by week 4 Date Initiated: 11/22/2015 Goal Status: Active Ulcer/skin breakdown will have a volume reduction of 50% by week 8 Date Initiated: 11/22/2015 Goal Status: Active Ulcer/skin breakdown will have a  volume reduction of 80% by week 12 Date Initiated: 11/22/2015 Goal Status: Active Ulcer/skin breakdown will heal within 14 weeks Date Initiated: 11/22/2015 Goal Status: Active Interventions: Assess patient/caregiver  ability to perform ulcer/skin care regimen upon admission and as needed Assess ulceration(s) every visit Provide education on ulcer and skin care Treatment Activities: Skin care regimen initiated : 11/22/2015 Topical wound management initiated : 11/22/2015 Notes: Electronic Signature(s) Signed: 06/20/2016 6:03:08 PM By: Gretta Cool, RN, BSN, Kim RN, BSN [address] [place] ([id number]) Entered By: Gretta Cool, RN, BSN, Kim on 06/19/2016 13:52:36 [place], [name] ([id number]) -------------------------------------------------------------------------------- Pain Assessment Details Patient Name: Carrie Mulligan C. Date of Service: 06/19/2016 1:30 PM Medical Record Patient Account Number: [id number] Number: Treating RN: Cornell Barman 09/13/1934 (80 y.o. Other Clinician: Date of Birth/Sex: Female) Treating [name], [name] Primary Care Physician/Extender: Claudette Laws Physician: Referring Physician: [name] in Treatment: 30 Active Problems Location of Pain Severity and Description of Pain Patient Has Paino No Site Locations With Dressing Change: No Pain Management and Medication Current Pain Management: Electronic Signature(s) Signed: 06/20/2016 6:03:08 PM By: Gretta Cool, RN, BSN, Kim RN, BSN Entered By: Gretta Cool, RN, BSN, Kim on 06/19/2016 13:39:52 [name], [name] ([id number]) -------------------------------------------------------------------------------- Patient/Caregiver Education Details Patient Name: [name]. Date of Service: 06/19/2016 1:30 PM Medical Record Patient Account Number: [id number] Number: Treating RN: Cornell Barman 17-May-1935 (80 y.o. Other Clinician: Date of Birth/Gender: Female) Treating [name], [name] Primary Care Physician/Extender: Claudette Laws Physician: Suella [place] in Treatment: 30 Referring Physician: [name] Education Assessment Education Provided To: Patient Education Topics Provided Wound/Skin  Impairment: Handouts: Caring for Your Ulcer, Other: continue wound care as prescribed Methods: Explain/Verbal Responses: State content correctly Electronic Signature(s) Signed: 06/20/2016 6:03:08 PM By: Gretta Cool, RN, BSN, Kim RN, BSN Entered By: Gretta Cool, RN, BSN, Kim on 06/19/2016 14:14:55 [name], [name] ([id number]) -------------------------------------------------------------------------------- Wound Assessment Details Patient Name: [name], [name] Date of Service: 06/19/2016 1:30 PM Medical Record Patient Account Number: [id number] Number: Treating RN: Cornell Barman [birth year] (80 y.o. Other Clinician: Date of Birth/Sex: Female) Treating [name], [name] Primary Care Physician/Extender: Claudette Laws Physician: Referring Physician: [name] in Treatment: 30 Wound Status Wound Number: 8 Primary Venous Leg Ulcer Etiology: Wound Location: Right Lower Leg - Medial Wound Status: Open Wounding Event: Trauma Comorbid Cataracts, Arrhythmia, Hypotension, Date Acquired: 04/06/2016 History: Osteoarthritis Weeks Of Treatment: 8 Clustered Wound: No Photos Photo Uploaded By: Gretta Cool, RN, BSN, Kim on 06/19/2016 16:29:36 Wound Measurements Length: (cm) 0.5 % Reduction i Width: (cm) 1 % Reduction i Depth: (cm) 0.1 Epithelializa Area: (cm) 0.393 Tunneling: Volume: (cm) 0.039 Undermining: n Area: -78.6% n Volume: -77.3% tion: None No No Wound Description Classification: Partial Thickness Wound Margin: Distinct, outline attached Exudate Amount: Small Exudate Type: Serosanguineous Exudate Color: red, brown Foul Odor After Cleansing: No Wound Bed Granulation Amount: None Present (0%) Exposed Structure Necrotic Amount: Large (67-100%) Fascia Exposed: No Necrotic Quality: Eschar Fat Layer Exposed: No Tendon Exposed: No [name], [name] ([id number]) Muscle Exposed: No Joint Exposed: No Bone Exposed: No Limited to Skin Breakdown Periwound Skin  Texture Texture Color No Abnormalities Noted: No No Abnormalities Noted: No Moisture Temperature / Pain No Abnormalities Noted: No Temperature: No Abnormality Dry / Scaly: Yes Tenderness on Palpation: Yes Wound Preparation Ulcer Cleansing: Rinsed/Irrigated with Saline Topical Anesthetic Applied: Other: lidocaine 4%, Treatment Notes Wound #8 (Right, Medial Lower Leg) 1. Cleansed with: Clean wound with Normal Saline 2. Anesthetic Topical Lidocaine 4% cream to wound bed prior to debridement 3. Peri-wound Care: Moisturizing lotion  4. Dressing Applied: Promogran 5. Secondary Dressing Applied Gauze and Kerlix/Conform Notes kerlix and Event organiser) Signed: 06/20/2016 6:03:08 PM By: Gretta Cool, RN, BSN, Kim RN, BSN Entered By: Gretta Cool, RN, BSN, Kim on 06/19/2016 13:46:07 [name], [name] ([id number]) -------------------------------------------------------------------------------- Vitals Details Patient Name: [name]. Date of Service: 06/19/2016 1:30 PM Medical Record Patient Account Number: [id number] Number: Treating RN: Cornell Barman 1934/10/18 (80 y.o. Other Clinician: Date of Birth/Sex: Female) Treating [name], [name] Primary Care Physician/Extender: Claudette Laws Physician: Referring Physician: [name] in Treatment: 30 Vital Signs Time Taken: 13:39 Temperature (F): 97.6 Height (in): 68 Pulse (bpm): 91 Weight (lbs): 178 Respiratory Rate (breaths/min): 16 Body Mass Index (BMI): 27.1 Blood Pressure (mmHg): 129/62 Reference Range: 80 - 120 mg / dl Electronic Signature(s) Signed: 06/20/2016 6:03:08 PM By: Gretta Cool, RN, BSN, Kim RN, BSN Entered By: Gretta Cool, RN, BSN, Kim on 06/19/2016 13:40:38

## 2016-06-23 LAB — BASIC METABOLIC PANEL
Anion gap: 7 (ref 5–15)
BUN: 29 mg/dL — AB (ref 6–20)
CALCIUM: 10 mg/dL (ref 8.9–10.3)
CHLORIDE: 102 mmol/L (ref 101–111)
CO2: 28 mmol/L (ref 22–32)
CREATININE: 1.4 mg/dL — AB (ref 0.44–1.00)
GFR calc non Af Amer: 35 mL/min — ABNORMAL LOW (ref >60)
GFR, EST AFRICAN AMERICAN: 41 mL/min — AB (ref >60)
GLUCOSE: 86 mg/dL (ref 65–99)
Potassium: 5.1 mmol/L (ref 3.5–5.1)
Sodium: 137 mmol/L (ref 135–145)

## 2016-06-23 LAB — CBC WITH DIFFERENTIAL/PLATELET
BASOS ABS: 0.1 10*3/uL (ref 0–0.1)
Basophils Relative: 2 %
EOS ABS: 0.2 10*3/uL (ref 0–0.7)
EOS PCT: 7 %
HCT: 33.1 % — ABNORMAL LOW (ref 35.0–47.0)
Hemoglobin: 10.9 g/dL — ABNORMAL LOW (ref 12.0–16.0)
LYMPHS ABS: 0.7 10*3/uL — AB (ref 1.0–3.6)
Lymphocytes Relative: 25 %
MCH: 27.8 pg (ref 26.0–34.0)
MCHC: 32.9 g/dL (ref 32.0–36.0)
MCV: 84.5 fL (ref 80.0–100.0)
MONO ABS: 0.5 10*3/uL (ref 0.2–0.9)
Monocytes Relative: 16 %
Neutro Abs: 1.6 10*3/uL (ref 1.4–6.5)
Neutrophils Relative %: 52 %
PLATELETS: 173 10*3/uL (ref 150–440)
RBC: 3.92 MIL/uL (ref 3.80–5.20)
RDW: 17.3 % — AB (ref 11.5–14.5)
WBC: 3 10*3/uL — ABNORMAL LOW (ref 3.6–11.0)

## 2016-06-23 LAB — PROTIME-INR
INR: 2.02
PROTHROMBIN TIME: 23.2 s — AB (ref 11.4–15.2)

## 2016-06-25 LAB — BASIC METABOLIC PANEL
ANION GAP: 9 (ref 5–15)
BUN: 31 mg/dL — AB (ref 6–20)
CO2: 26 mmol/L (ref 22–32)
Calcium: 10.3 mg/dL (ref 8.9–10.3)
Chloride: 101 mmol/L (ref 101–111)
Creatinine, Ser: 1.47 mg/dL — ABNORMAL HIGH (ref 0.44–1.00)
GFR calc Af Amer: 38 mL/min — ABNORMAL LOW (ref >60)
GFR calc non Af Amer: 33 mL/min — ABNORMAL LOW (ref >60)
GLUCOSE: 89 mg/dL (ref 65–99)
POTASSIUM: 4.4 mmol/L (ref 3.5–5.1)
Sodium: 136 mmol/L (ref 135–145)

## 2016-06-25 LAB — PROTIME-INR
INR: 1.98
Prothrombin Time: 22.8 seconds — ABNORMAL HIGH (ref 11.4–15.2)

## 2016-06-25 LAB — CBC WITH DIFFERENTIAL/PLATELET
BASOS ABS: 0.1 10*3/uL (ref 0–0.1)
Basophils Relative: 2 %
EOS PCT: 6 %
Eosinophils Absolute: 0.2 10*3/uL (ref 0–0.7)
HCT: 35.2 % (ref 35.0–47.0)
Hemoglobin: 11.6 g/dL — ABNORMAL LOW (ref 12.0–16.0)
LYMPHS ABS: 1.2 10*3/uL (ref 1.0–3.6)
LYMPHS PCT: 31 %
MCH: 27.5 pg (ref 26.0–34.0)
MCHC: 33.1 g/dL (ref 32.0–36.0)
MCV: 83.1 fL (ref 80.0–100.0)
MONO ABS: 0.6 10*3/uL (ref 0.2–0.9)
Monocytes Relative: 16 %
NEUTROS ABS: 1.8 10*3/uL (ref 1.4–6.5)
Neutrophils Relative %: 45 %
PLATELETS: 193 10*3/uL (ref 150–440)
RBC: 4.23 MIL/uL (ref 3.80–5.20)
RDW: 17.3 % — ABNORMAL HIGH (ref 11.5–14.5)
WBC: 4 10*3/uL (ref 3.6–11.0)

## 2016-06-26 ENCOUNTER — Encounter: Payer: Medicare Other | Admitting: Internal Medicine

## 2016-06-27 LAB — CBC WITH DIFFERENTIAL/PLATELET
BAND NEUTROPHILS: 0 %
BASOS PCT: 1 %
BLASTS: 0 %
Basophils Absolute: 0 10*3/uL (ref 0–0.1)
Eosinophils Absolute: 0.1 10*3/uL (ref 0–0.7)
Eosinophils Relative: 2 %
HEMATOCRIT: 34.2 % — AB (ref 35.0–47.0)
HEMOGLOBIN: 11.2 g/dL — AB (ref 12.0–16.0)
LYMPHS PCT: 34 %
Lymphs Abs: 1.1 10*3/uL (ref 1.0–3.6)
MCH: 27.2 pg (ref 26.0–34.0)
MCHC: 32.7 g/dL (ref 32.0–36.0)
MCV: 83.1 fL (ref 80.0–100.0)
MONO ABS: 0.4 10*3/uL (ref 0.2–0.9)
MONOS PCT: 12 %
Metamyelocytes Relative: 0 %
Myelocytes: 0 %
NEUTROS PCT: 51 %
NRBC: 0 /100{WBCs}
Neutro Abs: 1.7 10*3/uL (ref 1.4–6.5)
OTHER: 0 %
Platelets: 205 10*3/uL (ref 150–440)
Promyelocytes Absolute: 0 %
RBC: 4.11 MIL/uL (ref 3.80–5.20)
RDW: 17.1 % — AB (ref 11.5–14.5)
WBC: 3.3 10*3/uL — ABNORMAL LOW (ref 3.6–11.0)

## 2016-06-27 LAB — BASIC METABOLIC PANEL
ANION GAP: 8 (ref 5–15)
BUN: 31 mg/dL — AB (ref 6–20)
CHLORIDE: 101 mmol/L (ref 101–111)
CO2: 27 mmol/L (ref 22–32)
Calcium: 10.2 mg/dL (ref 8.9–10.3)
Creatinine, Ser: 1.51 mg/dL — ABNORMAL HIGH (ref 0.44–1.00)
GFR calc Af Amer: 37 mL/min — ABNORMAL LOW (ref >60)
GFR, EST NON AFRICAN AMERICAN: 32 mL/min — AB (ref >60)
GLUCOSE: 82 mg/dL (ref 65–99)
POTASSIUM: 4.7 mmol/L (ref 3.5–5.1)
Sodium: 136 mmol/L (ref 135–145)

## 2016-06-27 LAB — PROTIME-INR
INR: 2.09
Prothrombin Time: 23.8 seconds — ABNORMAL HIGH (ref 11.4–15.2)

## 2016-06-27 NOTE — Progress Notes (Signed)
[name] ([id number]) Visit Report for 06/26/2016 Arrival Information Details Patient Name: [name], [name]. Date of Service: 06/26/2016 1:30 PM Medical Record Patient Account Number: [id number] Number: Treating RN: Cornell Barman 12-Mar-1935 (81 y.o. Other Clinician: Date of Birth/Sex: Female) Treating [name], [name] Primary Care Physician/Extender: Claudette Laws Physician: Referring Physician: [name] in Treatment: 68 Visit Information History Since Last Visit Added or deleted any medications: No Patient Arrived: Wheel Chair Any new allergies or adverse reactions: No Arrival Time: 13:47 Had a fall or experienced change in No Accompanied By: son activities of daily living that may affect Transfer Assistance: Manual risk of falls: Patient Identification Verified: Yes Signs or symptoms of abuse/neglect since last No Secondary Verification Process Yes visito Completed: Hospitalized since last visit: No Patient Requires Transmission- No Has Dressing in Place as Prescribed: Yes Based Precautions: Has Compression in Place as Prescribed: Yes Patient Has Alerts: Yes Pain Present Now: No Patient Alerts: Patient on Blood Thinner [name]!!! Electronic Signature(s) Signed: 06/26/2016 6:06:27 PM By: Gretta Cool, RN, BSN, Kim RN, BSN Entered By: Gretta Cool, RN, BSN, Kim on 06/26/2016 13:48:51 [place], [name] ([id number]) -------------------------------------------------------------------------------- Encounter Discharge Information Details Patient Name: Carrie Mulligan C. Date of Service: 06/26/2016 1:30 PM Medical Record Patient Account Number: [id number] Number: Treating RN: Cornell Barman 1934/10/20 (81 y.o. Other Clinician: Date of Birth/Sex: Female) Treating [name], [name] Primary Care Physician/Extender: Claudette Laws Physician: Referring Physician: [name] in Treatment:  31 Encounter Discharge Information Items Schedule Follow-up Appointment: No Medication Reconciliation completed and provided to Patient/Care No Lonnie Rosado: Provided on Clinical Summary of Care: 06/26/2016 Form Type Recipient Paper Patient [place] Electronic Signature(s) Signed: 06/26/2016 2:25:06 PM By: Ruthine Dose Entered By: Ruthine Dose on 06/26/2016 14:25:05 [name] ([id number]) -------------------------------------------------------------------------------- Lower Extremity Assessment Details Patient Name: [name], [name] Date of Service: 06/26/2016 1:30 PM Medical Record Patient Account Number: [id number] Number: Treating RN: Cornell Barman July 23, 1934 (81 y.o. Other Clinician: Date of Birth/Sex: Female) Treating [name], [name] Primary Care Physician/Extender: Claudette Laws Physician: Referring Physician: [name] in Treatment: 31 Edema Assessment Assessed: [Left: No] [Right: No] E[Left: dema] [Right: :] Calf Left: Right: Point of Measurement: 34 cm From Medial Instep 32.5 cm 31 cm Ankle Left: Right: Point of Measurement: 10 cm From Medial Instep 23 cm 22.4 cm Vascular Assessment Claudication: Claudication Assessment [Left:None] [Right:None] Pulses: Dorsalis Pedis Palpable: [Left:Yes] [Right:Yes] Posterior Tibial Extremity colors, hair growth, and conditions: Extremity Color: [Left:Hyperpigmented] [Right:Normal] Hair Growth on Extremity: [Left:No] [Right:No] Temperature of Extremity: [Left:Warm] [Right:Warm] Capillary Refill: [Left:< 3 seconds] [Right:< 3 seconds] Dependent Rubor: [Left:No] [Right:No] Blanched when Elevated: [Left:No] [Right:No] Lipodermatosclerosis: [Left:No] [Right:No] Toe Nail Assessment Left: Right: Thick: No No Discolored: No No Deformed: No No [name], [name] ([id number]) Improper Length and Hygiene: No No Electronic Signature(s) Signed: 06/26/2016 6:06:27 PM By: Gretta Cool, RN, BSN, Kim RN,  BSN Entered By: Gretta Cool, RN, BSN, Kim on 06/26/2016 13:57:45 [name] ([id number]) -------------------------------------------------------------------------------- Multi Wound Chart Details Patient Name: Carrie Mulligan C. Date of Service: 06/26/2016 1:30 PM Medical Record Patient Account Number: [id number] Number: Treating RN: Cornell Barman [birth year] (81 y.o. Other Clinician: Date of Birth/Sex: Female) Treating [name], [name] Primary Care Physician/Extender: Claudette Laws Physician: Referring Physician: [name] in Treatment: 31 Vital Signs Height(in): 68 Pulse(bpm): 77 Weight(lbs): 178 Blood Pressure 122/63 (mmHg): Body Mass Index(BMI): 27 Temperature([name]): 97.8 Respiratory Rate 16 (breaths/min): Photos: [N/A:N/A] Wound Location: Right Lower Leg - Medial Right Lower Leg - N/A Posterior Wounding  Event: Trauma Gradually Appeared N/A Primary Etiology: Venous Leg Ulcer Venous Leg Ulcer N/A Comorbid History: Cataracts, Arrhythmia, Cataracts, Arrhythmia, N/A Hypotension, Hypotension, Osteoarthritis Osteoarthritis Date Acquired: 04/06/2016 06/26/2016 N/A Weeks of Treatment: 9 0 N/A Wound Status: Open Open N/A Measurements L x W x D 0.5x1x0.2 3x2.5x0.1 N/A (cm) Area (cm) : 0.393 5.89 N/A Volume (cm) : 0.079 0.589 N/A % Reduction in Area: -78.60% N/A N/A % Reduction in Volume: -259.10% N/A N/A Classification: Full Thickness Without Full Thickness Without N/A Exposed Support Exposed Support Structures Structures Exudate Amount: Small Medium N/A Exudate Type: Serosanguineous Serous N/A Exudate Color: red, brown amber N/A [name], [name] ([id number]) Wound Margin: Distinct, outline attached Flat and Intact N/A Granulation Amount: None Present (0%) None Present (0%) N/A Necrotic Amount: Large (67-100%) Large (67-100%) N/A Necrotic Tissue: Eschar Eschar N/A Exposed Structures: Fat: Yes Fascia: No N/A Fascia: No Fat: No Tendon:  No Tendon: No Muscle: No Muscle: No Joint: No Joint: No Bone: No Bone: No Limited to Skin Breakdown Epithelialization: None None N/A Debridement: Debridement ([id number]- Debridement ([id number]- N/A 11047) 11047) Pre-procedure 14:15 14:15 N/A Verification/Time Out Taken: Pain Control: Other Other N/A Tissue Debrided: Necrotic/Eschar, Subcutaneous N/A Subcutaneous Level: Skin/Subcutaneous Skin/Subcutaneous N/A Tissue Tissue Debridement Area (sq 0.5 7.5 N/A cm): Instrument: Curette Curette N/A Bleeding: Minimum Minimum N/A Hemostasis Achieved: Pressure Pressure N/A Procedural Pain: 0 3 N/A Post Procedural Pain: 0 0 N/A Debridement Treatment Procedure was tolerated Procedure was tolerated N/A Response: well well Post Debridement 0.5x1x0.2 3x2.5x0.2 N/A Measurements L x W x D (cm) Post Debridement 0.079 1.178 N/A Volume: (cm) Periwound Skin Texture: No Abnormalities Noted No Abnormalities Noted N/A Periwound Skin Dry/Scaly: Yes No Abnormalities Noted N/A Moisture: Periwound Skin Color: No Abnormalities Noted No Abnormalities Noted N/A Temperature: No Abnormality N/A N/A Tenderness on Yes No N/A Palpation: Wound Preparation: Ulcer Cleansing: Ulcer Cleansing: N/A Rinsed/Irrigated with Rinsed/Irrigated with Saline Saline Topical Anesthetic Topical Anesthetic [name], [name] ([id number]) Applied: Other: lidocaine Applied: Other: lidociane 4% 4% Procedures Performed: Debridement Debridement N/A Treatment Notes Electronic Signature(s) Signed: 06/26/2016 5:22:07 PM By: [name] MD Entered By: [name] on 06/26/2016 15:24:54 [name], [name] ([id number]) -------------------------------------------------------------------------------- Multi-Disciplinary Care Plan Details Patient Name: RANATA, LAUGHERY C. Date of Service: 06/26/2016 1:30 PM Medical Record Patient Account Number: [id number] Number: Treating RN: Cornell Barman 05-03-1935 (80 y.o.  Other Clinician: Date of Birth/Sex: Female) Treating [name], [name] Primary Care Physician/Extender: Claudette Laws Physician: Referring Physician: [name] in Treatment: 108 Active Inactive Orientation to the Wound Care Program Nursing Diagnoses: Knowledge deficit related to the wound healing center program Goals: Patient/caregiver will verbalize understanding of the [facility] Date Initiated: 11/22/2015 Goal Status: Active Interventions: Provide education on orientation to the wound center Notes: Venous Leg Ulcer Nursing Diagnoses: Knowledge deficit related to disease process and management Potential for venous Insuffiency (use before diagnosis confirmed) Goals: Patient will maintain optimal edema control Date Initiated: 11/22/2015 Goal Status: Active Patient/caregiver will verbalize understanding of disease process and disease management Date Initiated: 11/22/2015 Goal Status: Active Verify adequate tissue perfusion prior to therapeutic compression application Date Initiated: 11/22/2015 Goal Status: Active Interventions: Assess peripheral edema status every visit. [name], [name] ([id number]) Compression as ordered Provide education on venous insufficiency Treatment Activities: Non-invasive vascular studies : 11/22/2015 Therapeutic compression applied : 11/22/2015 Notes: Wound/Skin Impairment Nursing Diagnoses: Impaired tissue integrity Knowledge deficit related to ulceration/compromised skin integrity Goals: Patient/caregiver will verbalize understanding of skin care regimen Date Initiated: 11/22/2015 Goal Status: Active Ulcer/skin breakdown will have  a volume reduction of 30% by week 4 Date Initiated: 11/22/2015 Goal Status: Active Ulcer/skin breakdown will have a volume reduction of 50% by week 8 Date Initiated: 11/22/2015 Goal Status: Active Ulcer/skin breakdown will have a volume reduction of 80% by week 12 Date Initiated:  11/22/2015 Goal Status: Active Ulcer/skin breakdown will heal within 14 weeks Date Initiated: 11/22/2015 Goal Status: Active Interventions: Assess patient/caregiver ability to perform ulcer/skin care regimen upon admission and as needed Assess ulceration(s) every visit Provide education on ulcer and skin care Treatment Activities: Skin care regimen initiated : 11/22/2015 Topical wound management initiated : 11/22/2015 Notes: Electronic Signature(s) Signed: 06/26/2016 6:06:27 PM By: Gretta Cool, RN, BSN, Kim RN, BSN [address] [place] ([id number]) Entered By: Gretta Cool, RN, BSN, Kim on 06/26/2016 14:06:49 [name], [name] ([id number]) -------------------------------------------------------------------------------- Pain Assessment Details Patient Name: Carrie Mulligan C. Date of Service: 06/26/2016 1:30 PM Medical Record Patient Account Number: [id number] Number: Treating RN: Cornell Barman [birth year] (80 y.o. Other Clinician: Date of Birth/Sex: Female) Treating [name], [name] Primary Care Physician/Extender: Claudette Laws Physician: Referring Physician: [name] in Treatment: 31 Active Problems Location of Pain Severity and Description of Pain Patient Has Paino No Site Locations With Dressing Change: No Pain Management and Medication Current Pain Management: Electronic Signature(s) Signed: 06/26/2016 6:06:27 PM By: Gretta Cool, RN, BSN, Kim RN, BSN Entered By: Gretta Cool, RN, BSN, Kim on 06/26/2016 13:48:59 [name], [name] ([id number]) -------------------------------------------------------------------------------- Wound Assessment Details Patient Name: RILEI, KRAVITZ C. Date of Service: 06/26/2016 1:30 PM Medical Record Patient Account Number: [id number] Number: Treating RN: Cornell Barman 1935-01-15 (80 y.o. Other Clinician: Date of Birth/Sex: Female) Treating [name], [name] Primary Care Physician/Extender: Claudette Laws Physician: Referring  Physician: [name] in Treatment: 31 Wound Status Wound Number: 8 Primary Venous Leg Ulcer Etiology: Wound Location: Right Lower Leg - Medial Wound Status: Open Wounding Event: Trauma Comorbid Cataracts, Arrhythmia, Hypotension, Date Acquired: 04/06/2016 History: Osteoarthritis Weeks Of Treatment: 9 Clustered Wound: No Photos Wound Measurements Length: (cm) 0.5 % Reduction in Ar Width: (cm) 1 % Reduction in Vo Depth: (cm) 0.2 Epithelialization Area: (cm) 0.393 Tunneling: Volume: (cm) 0.079 Undermining: ea: -78.6% lume: -259.1% : None No No Wound Description Full Thickness Without Exposed Foul Odor After Classification: Support Structures Wound Margin: Distinct, outline attached Exudate Small Amount: Exudate Type: Serosanguineous Exudate Color: red, brown Cleansing: No Wound Bed Granulation Amount: None Present (0%) Exposed Structure Necrotic Amount: Large (67-100%) Fascia Exposed: No Necrotic Quality: Eschar Fat Layer Exposed: Yes [place], Lashana C. ([id number]) Tendon Exposed: No Muscle Exposed: No Joint Exposed: No Bone Exposed: No Periwound Skin Texture Texture Color No Abnormalities Noted: No No Abnormalities Noted: No Moisture Temperature / Pain No Abnormalities Noted: No Temperature: No Abnormality Dry / Scaly: Yes Tenderness on Palpation: Yes Wound Preparation Ulcer Cleansing: Rinsed/Irrigated with Saline Topical Anesthetic Applied: Other: lidocaine 4%, Electronic Signature(s) Signed: 06/26/2016 6:06:27 PM By: Gretta Cool, RN, BSN, Kim RN, BSN Entered By: Gretta Cool, RN, BSN, Kim on 06/26/2016 14:05:34 [place], [name] ([id number]) -------------------------------------------------------------------------------- Wound Assessment Details Patient Name: ITZEL, MCKIBBIN C. Date of Service: 06/26/2016 1:30 PM Medical Record Patient Account Number: [id number] Number: Treating RN: Cornell Barman Jun 01, 1935 (80 y.o. Other  Clinician: Date of Birth/Sex: Female) Treating [name], [name] Primary Care Physician/Extender: Claudette Laws Physician: Referring Physician: [name] in Treatment: 31 Wound Status Wound Number: 9 Primary Venous Leg Ulcer Etiology: Wound Location: Right Lower Leg - Posterior Wound Status: Open Wounding Event: Gradually Appeared Comorbid Cataracts, Arrhythmia, Hypotension, Date Acquired: 06/26/2016 History: Osteoarthritis Weeks  Of Treatment: 0 Clustered Wound: No Photos Wound Measurements Length: (cm) 3 Width: (cm) 2.5 Depth: (cm) 0.1 Area: (cm) 5.89 Volume: (cm) 0.589 % Reduction in Area: % Reduction in Volume: Epithelialization: None Tunneling: No Undermining: No Wound Description Full Thickness Without Exposed Classification: Support Structures Wound Margin: Flat and Intact Exudate Medium Amount: Exudate Type: Serous Exudate Color: amber Wound Bed Granulation Amount: None Present (0%) Exposed Structure Necrotic Amount: Large (67-100%) Fascia Exposed: No Necrotic Quality: Eschar Fat Layer Exposed: No Guimaraes, Katalea C. ([id number]) Tendon Exposed: No Muscle Exposed: No Joint Exposed: No Bone Exposed: No Limited to Skin Breakdown Periwound Skin Texture Texture Color No Abnormalities Noted: No No Abnormalities Noted: No Moisture No Abnormalities Noted: No Wound Preparation Ulcer Cleansing: Rinsed/Irrigated with Saline Topical Anesthetic Applied: Other: lidociane 4%, Electronic Signature(s) Signed: 06/26/2016 6:06:27 PM By: Gretta Cool, RN, BSN, Kim RN, BSN Entered By: Gretta Cool, RN, BSN, Kim on 06/26/2016 14:04:51 [place], [name] ([id number]) -------------------------------------------------------------------------------- Vitals Details Patient Name: Carrie Kussmaul. Date of Service: 06/26/2016 1:30 PM Medical Record Patient Account Number: [id number] Number: Treating RN: Cornell Barman 1934/08/27 (80 y.o. Other  Clinician: Date of Birth/Sex: Female) Treating [name], [name] Primary Care Physician/Extender: Claudette Laws Physician: Referring Physician: [name] in Treatment: 31 Vital Signs Time Taken: 14:49 Temperature ([name]): 97.8 Height (in): 68 Pulse (bpm): 77 Weight (lbs): 178 Respiratory Rate (breaths/min): 16 Body Mass Index (BMI): 27.1 Blood Pressure (mmHg): 122/63 Reference Range: 80 - 120 mg / dl Electronic Signature(s) Signed: 06/26/2016 6:06:27 PM By: Gretta Cool, RN, BSN, Kim RN, BSN Entered By: Gretta Cool, RN, BSN, Kim on 06/26/2016 13:49:35

## 2016-06-27 NOTE — Progress Notes (Signed)
[name], [name] ([id number]) Visit Report for 06/26/2016 Chief Complaint Document Details [name], [name] Date of Service: 06/26/2016 1:30 PM Patient Name: C. Patient Account Number: [id number] Medical Record Treating RN: Cornell Barman [id number] Number: Other Clinician: September 25, 1934 (81 y.o. Treating Robi Mitter, [place] Date of Birth/Sex: Female) Physician/Extender: G Primary Care [name] Physician: Referring Physician: [name] in Treatment: 31 Information Obtained from: Patient Chief Complaint Patient here for reevaluation of her right lower externally ulcer Electronic Signature(s) Signed: 06/26/2016 5:22:07 PM By: [name] MD Entered By: [name] on 06/26/2016 15:25:33 Berninger, [name] ([id number]) -------------------------------------------------------------------------------- Debridement Details [name] Date of Service: 06/26/2016 1:30 PM Patient Name: C. Patient Account Number: [id number] Medical Record Treating RN: Cornell Barman [id number] Number: Other Clinician: [name] 11, 1935 (81 y.o. Treating Ashraf Mesta, [place] Date of Birth/Sex: Female) Physician/Extender: G Primary Care [name] Physician: Referring Physician: [name] in Treatment: 31 Debridement Performed for Wound #8 Right,Medial Lower Leg Assessment: Performed By: Physician [name], MD Debridement: Debridement Pre-procedure Yes - 14:15 Verification/Time Out Taken: Start Time: 14:16 Pain Control: Other : lodocaine 4% Level: Skin/Subcutaneous Tissue Total Area Debrided (L x 0.5 (cm) x 1 (cm) = 0.5 (cm) W): Tissue and other Viable, Eschar, Subcutaneous material debrided: Instrument: Curette Bleeding: Minimum Hemostasis Achieved: Pressure End Time: 14:17 Procedural Pain: 0 Post Procedural Pain: 0 Response to Treatment: Procedure was tolerated well Post Debridement Measurements of Total Wound Length: (cm) 0.5 Width: (cm) 1 Depth: (cm)  0.2 Volume: (cm) 0.079 Character of Wound/Ulcer Post Requires Further Debridement Debridement: Severity of Tissue Post Debridement: Fat layer exposed Post Procedure Diagnosis Same as Pre-procedure Electronic Signature(s) [name], [name] ([id number]) Signed: 06/26/2016 5:22:07 PM By: [name] MD Signed: 06/26/2016 6:06:27 PM By: Gretta Cool RN, BSN, Kim RN, BSN Entered By: [name] on 06/26/2016 15:25:07 [name], [name] ([id number]) -------------------------------------------------------------------------------- Debridement Details [name] Date of Service: 06/26/2016 1:30 PM Patient Name: C. Patient Account Number: [id number] Medical Record Treating RN: Cornell Barman [id number] Number: Other Clinician: 01/05/1935 (81 y.o. Treating Greenlee Ancheta Date of Birth/Sex: Female) Physician/Extender: G Primary Care [name] Physician: Referring Physician: [name] in Treatment: 31 Debridement Performed for Wound #9 Right,Posterior Lower Leg Assessment: Performed By: Physician [name], MD Debridement: Debridement Pre-procedure Yes - 14:15 Verification/Time Out Taken: Start Time: 14:16 Pain Control: Other : lodocaine 4% Level: Skin/Subcutaneous Tissue Total Area Debrided (L x 3 (cm) x 2.5 (cm) = 7.5 (cm) W): Tissue and other Subcutaneous material debrided: Instrument: Curette Bleeding: Minimum Hemostasis Achieved: Pressure End Time: 14:17 Procedural Pain: 3 Post Procedural Pain: 0 Response to Treatment: Procedure was tolerated well Post Debridement Measurements of Total Wound Length: (cm) 3 Width: (cm) 2.5 Depth: (cm) 0.2 Volume: (cm) 1.178 Character of Wound/Ulcer Post Requires Further Debridement Debridement: Severity of Tissue Post Debridement: Fat layer exposed Post Procedure Diagnosis Same as Pre-procedure Electronic Signature(s) [name], [name] ([id number]) Signed: 06/26/2016 5:22:07 PM By: [name] MD Signed: 06/26/2016 6:06:27 PM By: Gretta Cool RN, BSN, Kim RN, BSN Entered By: [name] on 06/26/2016 15:25:20 [name], [name] ([id number]) -------------------------------------------------------------------------------- HPI Details [name] Date of Service: 06/26/2016 1:30 PM Patient Name: C. Patient Account Number: [id number] Medical Record Treating RN: Cornell Barman [id number] Number: Other Clinician: Mar 09, 1935 (81 y.o. Treating Sarvesh Meddaugh, [place] Date of Birth/Sex: Female) Physician/Extender: G [place] Physician: Referring Physician: [name] in Treatment: 31 History of Present Illness HPI Description: 11/22/15; this is [place] is a 81 year old woman who lives at home on her own. According to the  patient and her daughter was present she has had long-standing edema in her legs dating back many years. She also has a history of chronic systolic heart failure, atrial fibrillation and is status post mitral valve replacement. Last echocardiogram I see [facility] showed an ejection fraction of 40-45% she is on Lasix 60 mg a day and spironolactone 25 mg a day. Her current problem began in December around Christmas time she developed a small hematoma in the medial part of her left leg which rapidly expanded to a very large hematoma that required surgical debridement. This situation was complicated by the fact that the patient is on long-standing Coumadin for mechanical heart valve. She went to the OR had this evacuated on January 4 /17. The wound has gradually improved however she has developed a small wounds around this area and more recently a wound on the right lateral leg. She is weeping edema fluid. The patient is already been to see vascular surgery. It was recommended that she wear Unna boots, she did not tolerate this due to pain in the left leg. She was then prescribed Juzo stockings and really can't get these on herself  although truthfully there is probably too much edema for a Juzo stockings currently. She is not a diabetic and has no history of PAD or claudication that I could elicit. She does not use the external compression pumps reliably. She comes today with notes from her primary physician and Dr. Ronalee Belts both recommending various forms of compression but the patient does not really complied with them. Has been using the external compression pumps with some regularity but certainly not daily on the right leg and this has helped. I also note that her daughter tells me the history that she does not sleep in bed at home. She has a hospital bed but with her legs up she finds this painful so she sleeps in the couch sitting up with her legs dependent. 11/29/15; the patient is arrives today accompanied by her son. He expresses satisfaction that she is maintained the compression all week. 12/06/15; the patient has kept her Profore light wraps on, we have good edema control no major change in the wounds we have been using Aquacel 12/13/15; changed to RTD last week. One of the 3 wounds on her left medial leg is healed she has 2 remaining wounds here and one on the right lateral leg. 12/18/2015 -- the patient was at Dr. Nino Parsley office today and he was seeing her for an arterial study. While the wrap was being removed she had an inadvertent laceration of the left proximal anterior leg which bled quite profusely and a compression dressing was applied over this and the patient was here to get her Profore wraps done. I was asked to see the patient to make an assessment and treat appropriately. 12/26/15; the patient's injury on the left proximal leg and Steri-Strips removed after soaking. There is an open area here. The original wounds to still open on the right lateral and left medial leg. 01/03/16 patient's injury on her proximal left leg looks quite good. Still small open area on the medial left leg which appears to be  improving. The area on the right lateral leg still substantially open with no real improvement in wound depth. Her edema control is marginal with a Profore light. We have been using RTD Kaelin, Carmeline C. ([id number]) for 3-4 weeks without any major change 01/10/16: wounds without s/s of infection. vascular results are pending regarding arterial studies.  01/17/16; patient comes in today complaining of severe pain however I think most of this is in the right hip not related to her wounds. She continues with a oval-shaped wound on the right lateral leg, trauma to the left anterior leg just below her tibial plateau. She has a smaller eschar on the left anterior leg. She is being using Prisma however she informs [place] today that she is actually allergic to silver, nose this from a previous application at [facility] some years ago 01/24/16; edema is not so well controlled today, I think I reduced her to [place] bilaterally last week. The area which was a scissor injury on her left upper anterior lower leg is fully epithelialized. She only has a small open area remaining on the medial aspect of her left leg. The oval-shaped wound on the right mid lateral leg may be a bit smaller. Debrided of surface slough nonviable subcutaneous tissue. I had changed to collagen 2 weeks ago in an attempt to get this to close 01/31/16 all the patient's wounds on her left leg give healed. We have good edema control with bilateral Profore lites which she has been compliant with. She still has the oval-shaped wound on the right lateral calf allergic even this appears to be slowly improving. The patient has juxtalite stockings at home. She states she thinks she can put these on. She also has external compression pumps at home although I think her compliance with this has not been good in the past. She complains today of edema in her thighs. Tells me she takes 40 mg of Lasix daily 02/21/16; only 1 small wound remains on the lateral  aspect of the right calf. She is using Juzo stockings on the left leg although she complains about difficulty in applying them. She has external compression pumps at home 02/28/16; the small open wound on her right lateral calf is improved in terms of wound area. It appears that she has a wrap injury on the anterior aspect of the upper leg 03/06/16; the small open wound on her right lateral calf has a very small open area remaining. The wrap injury on the anterior aspect of the upper leg also appears better. She arrives today in clinic with a history of dyspnea with minimal exertion starting with the last 2-4 days. She does not describe chest pain. Her son and our intake nurse think she has facial swelling. She has a history of an artificial mitral valve on Coumadin 03/13/16; the small wound on the right lateral calf is no better this week. The superior wrap injury anteriorly requires debridement of surface eschar and nonviable subcutaneous tissue. She has been to her primary doctor with regards to her dyspnea we identified last week. Per the patient's description her Lasix has been increased 03/21/2016 -- patient of Dr. Dellia Nims who could not keep her appointment yesterday but has come in today with the right lower leg looking good and this is the leg which has been treated in the recent past. However, her left lower extremity is extremely swollen, tender and edematous with redness and discoloration. 03/27/16; there is only 2 small areas remain on the right leg. The edema that was so concerning last week has come down however there is extensive bruising on the lateral left leg medial left foot suggesting that she lost some blood into the leg itself. I checked her hemoglobin on 9/21 was 14.5. Her INR was over 6. Duplex ultrasound was negative for DVT 04/03/16 at this point in  time today patient is actually doing substantially better in regard to the wound on the anterior right lower extremity.  currently there is no slough or eschar noted and no evidence of erythema, discharge, or local infection. She is exhibiting no signs of systemic infection. She is tolerating the dressing changes as well as the wrapping at this point in time. 04/24/16; I have not seen Mrs. Stefano for 3 weeks. Apparently she was admitted to hospital for respiratory issues/also apparently has had multiple compression fractures and had kyphoplasty. When she was last here she only had one small open area remaining on the left leg she was using juxtalite stockings. Her son is upset about restarting the Coumadin which she blames or the swelling in her legs. She is on Coumadin for prophylaxis with a chronic artificial heart valve. [name], [name] ([id number]) 04/30/16 at this point in time patient has been tolerating the 3 layer compression wrap as well as the calcium alginate. 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 With tthat being said she does have the continued wound of the left lower extremity as well as the right medial lower extremity. The right side is significantly smaller compared to the left but actually is slough covered. fortunately she has no significant tenderness at rest although with manipulation of the right location of the wound this is significantly tender. 05/07/16 for follow-up evaluation today both the patient's wounds appear to be significantly improved in size compared to last week. She is also having less pain at both locations currently. Her pain level at most is related to be a 1-2 out of 10. She does have some discharge but fortunately no evidence of infection at this point.she also continues to tolerate the compression wraps well. 05/14/16; the patient's wound on the left leg actually looks as though it's on its way to closure. She is using collagen to this area. She has a small punched out area over the right medial calf. The cause of this is not really clear it has probably 0.4  cm of depth. She has an IV in her right hand which she states is for IV fluid when she develops low sodiums her potassiums, the etiology of this is not clear 05-21-16 she presents today with continued ulcerations the right lower extremity, posterior aspect. she has been using Iodoflex and compression therapy to the area. She denies any new injuries or trauma. She does have 2 areas proximal to this ulceration of dry crust along with an area to her left posterior lower leg that is purple discolored area appears similar to how her current ulceration started, she denies any trauma or injury to the left posterior leg we will monitor all of these areas. 05/28/16; the patient is down to 1 small open area on the right medial mid calf. This is however larger and in 50% of the surface area deeper approximately 0.4 cm. The reason for this deterioration is unclear. I've gone ahead and done a culture of this area she also tells [place] today that she is short of breath. She is also noted a swelling on her lower left eyelid 06/05/16; the patient is 1 small open area on the right medial mid calf. This looks about the same as last week. The deep area, medial 50% of the wound looks about the same. CandS I did last week was negative. The entire area looks about the same 06/12/16; not much change in this over the course of the last week. Patient comes in today  complaining of extreme back pain. Apparently she has either a kyphoplasty or vertebral plasty scheduled at [facility] radiology tomorrow. For this reason no debridement today. We have been using Iodoflex 06/19/16; patient arrived with the surface eschar from last week removed with a curet debrided of subcutaneous tissue. She tolerates this reasonably well. She is still in a skilled facility. States her vertebral plasty last week is made her pain bearable 06/26/16; patient arrived with a new wound on the posterior aspect of her left calf. She notices 3 or 4  days ago but is not certain how this happened. She states she simply felt a stinging sensation and one day on her foot and then the next day on the back of her calf. Electronic Signature(s) Signed: 06/26/2016 5:22:07 PM By: [name] MD Entered By: [name] on 06/26/2016 15:26:28 [name] ([id number]) -------------------------------------------------------------------------------- Physical Exam Details [name] Date of Service: 06/26/2016 1:30 PM Patient Name: C. Patient Account Number: [id number] Medical Record Treating RN: Cornell Barman [id number] Number: Other Clinician: 08/02/1934 (81 y.o. Treating Avondre Richens Date of Birth/Sex: Female) Physician/Extender: G Primary Care [name] Physician: Referring Physician: [name] in Treatment: 31 Constitutional Sitting or standing Blood Pressure is within target range for patient.. Pulse regular and within target range for patient.Marland Kitchen Respirations regular, non-labored and within target range.. Temperature is normal and within the target range for the patient.. Patient's appearance is neat and clean. Appears in no acute distress. Well nourished and well developed.. Eyes Conjunctivae clear. No discharge.. Cardiovascular Pedal pulses palpable and strong bilaterally.. Edema present in both extremities. Severe bilateral venous insufficiency. Notes Wound exam; her original wound on the posterior right medial calf. This had surface eschar nonviable subcutaneous tissue removed with a #3 curet. Base of the wound looks quite healthy no evidence of infection oShe has a new wound on the left posterior calf. This is of uncertain etiology. Again a covered with a thick black eschar that is removed also some nonviable subcutaneous tissue. Using a #3 curet. No evidence of surrounding infection Electronic Signature(s) Signed: 06/26/2016 5:22:07 PM By: [name] MD Entered By: [name] on  06/26/2016 15:28:06 [name] ([id number]) -------------------------------------------------------------------------------- Physician Orders Details [name] Date of Service: 06/26/2016 1:30 PM Patient Name: C. Patient Account Number: [id number] Medical Record Treating RN: Cornell Barman [id number] Number: Other Clinician: May 09, 1935 (81 y.o. Treating Hollister Wessler, [place] Date of Birth/Sex: Female) Physician/Extender: G Primary Care [name] Physician: Referring Physician: [name] in Treatment: 62 Verbal / Phone Orders: Yes Clinician: Cornell Barman Read Back and Verified: Yes Diagnosis Coding Wound Cleansing Wound #8 Right,Medial Lower Leg o Clean wound with Normal Saline. o Cleanse wound with mild soap and water o No tub bath. - give sink or bed baths Wound #9 Right,Posterior Lower Leg o Clean wound with Normal Saline. o Cleanse wound with mild soap and water o No tub bath. - give sink or bed baths Anesthetic Wound #8 Right,Medial Lower Leg o Topical Lidocaine 4% cream applied to wound bed prior to debridement - for clinic use Wound #9 Right,Posterior Lower Leg o Topical Lidocaine 4% cream applied to wound bed prior to debridement - for clinic use Primary Wound Dressing Wound #8 Right,Medial Lower Leg o Hydrogel Wound #9 Right,Posterior Lower Leg o Hydrogel Secondary Dressing Wound #8 Right,Medial Lower Leg o ABD pad Wound #9 Right,Posterior Lower Leg o ABD pad Spruell, Shandon C. ([id number]) Dressing Change Frequency Wound #8 Right,Medial Lower Leg o Other: - Saturday when patient takes  shower. Wound #9 Right,Posterior Lower Leg o Other: - Saturday when patient takes shower. Follow-up Appointments Wound #8 Right,Medial Lower Leg o Return Appointment in 1 week. Wound #9 Right,Posterior Lower Leg o Return Appointment in 1 week. Edema Control Wound #8 Right,Medial Lower Leg o Kerlix and Coban -  Bilateral Wound #9 Right,Posterior Lower Leg o Kerlix and Coban - Bilateral Additional Orders / Instructions Wound #8 Right,Medial Lower Leg o Increase protein intake. Wound #9 Right,Posterior Lower Leg o Increase protein intake. Electronic Signature(s) Signed: 06/26/2016 5:22:07 PM By: [name] MD Signed: 06/26/2016 6:06:27 PM By: Gretta Cool RN, BSN, Kim RN, BSN Entered By: Gretta Cool, RN, BSN, Kim on 06/26/2016 14:20:42 [name], [name] ([id number]) -------------------------------------------------------------------------------- Problem List Details [name] Date of Service: 06/26/2016 1:30 PM Patient Name: C. Patient Account Number: [id number] Medical Record Treating RN: Cornell Barman [id number] Number: Other Clinician: [birth year] (81 y.o. Treating Chiniqua Kilcrease Date of Birth/Sex: Female) Physician/Extender: G Primary Care [name] Physician: Referring Physician: [name] in Treatment: 31 Active Problems ICD-10 Encounter Code Description Active Date Diagnosis L97.812 Non-pressure chronic ulcer of other part of right lower leg 04/30/2016 Yes with fat layer exposed I87.333 Chronic venous hypertension (idiopathic) with ulcer and 04/03/2016 Yes inflammation of bilateral lower extremity I89.0 Lymphedema, not elsewhere classified 04/03/2016 Yes N82.95 Chronic systolic (congestive) heart failure 04/03/2016 Yes Inactive Problems Resolved Problems ICD-10 Code Description Active Date Resolved Date S81.812S Laceration without foreign body, left lower leg, sequela 04/03/2016 04/03/2016 Electronic Signature(s) Signed: 06/26/2016 5:22:07 PM By: [name] MD Entered By: [name] on 06/26/2016 15:24:41 [name], [name] ([id number]) -------------------------------------------------------------------------------- Progress Note Details [name] Date of Service: 06/26/2016 1:30 PM Patient Name: C. Patient Account Number:  [id number] Medical Record Treating RN: Cornell Barman [id number] Number: Other Clinician: 1934/08/16 (81 y.o. Treating Ece Cumberland, [place] Date of Birth/Sex: Female) Physician/Extender: G Primary Care [name] Physician: Referring Physician: [name] in Treatment: 31 Subjective Chief Complaint Information obtained from Patient Patient here for reevaluation of her right lower externally ulcer History of Present Illness (HPI) 11/22/15; this is [place] is a 81 year old woman who lives at home on her own. According to the patient and her daughter was present she has had long-standing edema in her legs dating back many years. She also has a history of chronic systolic heart failure, atrial fibrillation and is status post mitral valve replacement. Last echocardiogram I see in [facility] showed an ejection fraction of 40-45% she is on Lasix 60 mg a day and spironolactone 25 mg a day. Her current problem began in December around Christmas time she developed a small hematoma in the medial part of her left leg which rapidly expanded to a very large hematoma that required surgical debridement. This situation was complicated by the fact that the patient is on long-standing Coumadin for mechanical heart valve. She went to the OR had this evacuated on January 4 /17. The wound has gradually improved however she has developed a small wounds around this area and more recently a wound on the right lateral leg. She is weeping edema fluid. The patient is already been to see vascular surgery. It was recommended that she wear Unna boots, she did not tolerate this due to pain in the left leg. She was then prescribed Juzo stockings and really can't get these on herself although truthfully there is probably too much edema for a Juzo stockings currently. She is not a diabetic and has no history of PAD or claudication that I could elicit. She does not  use the external compression pumps reliably. She  comes today with notes from her primary physician and Dr. Ronalee Belts both recommending various forms of compression but the patient does not really complied with them. Has been using the external compression pumps with some regularity but certainly not daily on the right leg and this has helped. I also note that her daughter tells me the history that she does not sleep in bed at home. She has a hospital bed but with her legs up she finds this painful so she sleeps in the couch sitting up with her legs dependent. 11/29/15; the patient is arrives today accompanied by her son. He expresses satisfaction that she is maintained the compression all week. 12/06/15; the patient has kept her Profore light wraps on, we have good edema control no major change in the wounds we have been using Aquacel 12/13/15; changed to RTD last week. One of the 3 wounds on her left medial leg is healed she has 2 remaining wounds here and one on the right lateral leg. 12/18/2015 -- the patient was at Dr. Nino Parsley office today and he was seeing her for an arterial study. While the wrap was being removed she had an inadvertent laceration of the left proximal anterior leg which bled quite profusely and a compression dressing was applied over this and the patient was here to get her Profore wraps done. I was asked to see the patient to make an assessment and treat appropriately. [name], [name] ([id number]) 12/26/15; the patient's injury on the left proximal leg and Steri-Strips removed after soaking. There is an open area here. The original wounds to still open on the right lateral and left medial leg. 01/03/16 patient's injury on her proximal left leg looks quite good. Still small open area on the medial left leg which appears to be improving. The area on the right lateral leg still substantially open with no real improvement in wound depth. Her edema control is marginal with a Profore light. We have been using RTD for 3-4 weeks  without any major change 01/10/16: wounds without s/s of infection. vascular results are pending regarding arterial studies. 01/17/16; patient comes in today complaining of severe pain however I think most of this is in the right hip not related to her wounds. She continues with a oval-shaped wound on the right lateral leg, trauma to the left anterior leg just below her tibial plateau. She has a smaller eschar on the left anterior leg. 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 The area which was a scissor injury on her left upper anterior lower leg is fully epithelialized. She only has a small open area remaining on the medial aspect of her left leg. The oval-shaped wound on the right mid lateral leg may be a bit smaller. Debrided of surface slough nonviable subcutaneous tissue. I had changed to collagen 2 weeks ago in an attempt to get this to close 01/31/16 all the patient's wounds on her left leg give healed. We have good edema control with bilateral Profore lites which she has been compliant with. She still has the oval-shaped wound on the right lateral calf allergic even this appears to be slowly improving. The patient has juxtalite stockings at home. She states she thinks she can put  these on. She also has external compression pumps at home although I think her compliance with this has not been good in the past. She complains today of edema in her thighs. Tells me she takes 40 mg of Lasix daily 02/21/16; only 1 small wound remains on the lateral aspect of the right calf. She is using Juzo stockings on the left leg although she complains about difficulty in applying them. She has external compression pumps at home 02/28/16; the small open wound on her right lateral calf is  improved in terms of wound area. It appears that she has a wrap injury on the anterior aspect of the upper leg 03/06/16; the small open wound on her right lateral calf has a very small open area remaining. The wrap injury on the anterior aspect of the upper leg also appears better. She arrives today in clinic with a history of dyspnea with minimal exertion starting with the last 2-4 days. She does not describe chest pain. Her son and our intake nurse think she has facial swelling. She has a history of an artificial mitral valve on Coumadin 03/13/16; the small wound on the right lateral calf is no better this week. The superior wrap injury anteriorly requires debridement of surface eschar and nonviable subcutaneous tissue. She has been to her primary doctor with regards to her dyspnea we identified last week. Per the patient's description her Lasix has been increased 03/21/2016 -- patient of Dr. Dellia Nims who could not keep her appointment yesterday but has come in today with the right lower leg looking good and this is the leg which has been treated in the recent past. However, her left lower extremity is extremely swollen, tender and edematous with redness and discoloration. 03/27/16; there is only 2 small areas remain on the right leg. The edema that was so concerning last week has come down however there is extensive bruising on the lateral left leg medial left foot suggesting that she lost some blood into the leg itself. I checked her hemoglobin on 9/21 was 14.5. Her INR was over 6. Duplex ultrasound was negative for DVT 04/03/16 at this point in time today patient is actually doing substantially better in regard to the wound on the anterior right lower extremity. currently there is no slough or eschar noted and no evidence of erythema, discharge, or local infection. She is exhibiting no signs of systemic infection. She is tolerating the dressing changes as well as the wrapping at this point in  time. 04/24/16; I have not seen Mrs. Schwieger for 3 weeks. Apparently she was admitted to hospital for respiratory AHMONI, EDGE. ([id number]) issues/also apparently has had multiple compression fractures and had kyphoplasty. When she was last here she only had one small open area remaining on the left leg she was using juxtalite stockings. Her son is upset about restarting the Coumadin which she blames or the swelling in her legs. She is on Coumadin for prophylaxis with a chronic artificial heart valve. 04/30/16 at this point in time patient has been tolerating the 3 layer compression wrap as well as the calcium alginate. 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 With tthat being said she does have the continued wound of the left lower extremity as well as the right medial lower extremity. The right side is significantly smaller compared to the left but actually is slough covered. fortunately she has no significant tenderness at rest although with manipulation of the right location of the wound this is significantly tender.  05/07/16 for follow-up evaluation today both the patient's wounds appear to be significantly improved in size compared to last week. She is also having less pain at both locations currently. Her pain level at most is related to be a 1-2 out of 10. She does have some discharge but fortunately no evidence of infection at this point.she also continues to tolerate the compression wraps well. 05/14/16; the patient's wound on the left leg actually looks as though it's on its way to closure. She is using collagen to this area. She has a small punched out area over the right medial calf. The cause of this is not really clear it has probably 0.4 cm of depth. She has an IV in her right hand which she states is for IV fluid when she develops low sodiums her potassiums, the etiology of this is not clear 05-21-16 she presents today with continued ulcerations the right lower  extremity, posterior aspect. she has been using Iodoflex and compression therapy to the area. She denies any new injuries or trauma. She does have 2 areas proximal to this ulceration of dry crust along with an area to her left posterior lower leg that is purple discolored area appears similar to how her current ulceration started, she denies any trauma or injury to the left posterior leg we will monitor all of these areas. 05/28/16; the patient is down to 1 small open area on the right medial mid calf. This is however larger and in 50% of the surface area deeper approximately 0.4 cm. The reason for this deterioration is unclear. I've gone ahead and done a culture of this area she also tells [place] today that she is short of breath. She is also noted a swelling on her lower left eyelid 06/05/16; the patient is 1 small open area on the right medial mid calf. This looks about the same as last week. The deep area, medial 50% of the wound looks about the same. CandS I did last week was negative. The entire area looks about the same 06/12/16; not much change in this over the course of the last week. Patient comes in today complaining of extreme back pain. Apparently she has either a kyphoplasty or vertebral plasty scheduled at [facility] radiology tomorrow. For this reason no debridement today. We have been using Iodoflex 06/19/16; patient arrived with the surface eschar from last week removed with a curet debrided of subcutaneous tissue. She tolerates this reasonably well. She is still in a skilled facility. States her vertebral plasty last week is made her pain bearable 06/26/16; patient arrived with a new wound on the posterior aspect of her left calf. She notices 3 or 4 days ago but is not certain how this happened. She states she simply felt a stinging sensation and one day on her foot and then the next day on the back of her calf. Objective Constitutional Sitting or standing Blood Pressure is  within target range for patient.. Pulse regular and within target range [name], Krystall C. ([id number]) for patient.Marland Kitchen Respirations regular, non-labored and within target range.. Temperature is normal and within the target range for the patient.. Patient's appearance is neat and clean. Appears in no acute distress. Well nourished and well developed.. Vitals Time Taken: 2:49 PM, Height: 68 in, Weight: 178 lbs, BMI: 27.1, Temperature: 97.8 F, Pulse: 77 bpm, Respiratory Rate: 16 breaths/min, Blood Pressure: 122/63 mmHg. Eyes Conjunctivae clear. No discharge.. Cardiovascular Pedal pulses palpable and strong bilaterally.. Edema present in both extremities. Severe bilateral venous  insufficiency. General Notes: Wound exam; her original wound on the posterior right medial calf. This had surface eschar nonviable subcutaneous tissue removed with a #3 curet. Base of the wound looks quite healthy no evidence of infection She has a new wound on the left posterior calf. This is of uncertain etiology. Again a covered with a thick black eschar that is removed also some nonviable subcutaneous tissue. Using a #3 curet. No evidence of surrounding infection Integumentary (Hair, Skin) Wound #8 status is Open. Original cause of wound was Trauma. The wound is located on the Right,Medial Lower Leg. The wound measures 0.5cm length x 1cm width x 0.2cm depth; 0.393cm^2 area and 0.079cm^3 volume. There is fat exposed. There is no tunneling or undermining noted. There is a small amount of serosanguineous drainage noted. The wound margin is distinct with the outline attached to the wound base. There is no granulation within the wound bed. There is a large (67-100%) amount of necrotic tissue within the wound bed including Eschar. The periwound skin appearance exhibited: Dry/Scaly. Periwound temperature was noted as No Abnormality. The periwound has tenderness on palpation. Wound #9 status is Open. Original cause of  wound was Gradually Appeared. The wound is located on the Right,Posterior Lower Leg. The wound measures 3cm length x 2.5cm width x 0.1cm depth; 5.89cm^2 area and 0.589cm^3 volume. The wound is limited to skin breakdown. There is no tunneling or undermining noted. There is a medium amount of serous drainage noted. The wound margin is flat and intact. There is no granulation within the wound bed. There is a large (67-100%) amount of necrotic tissue within the wound bed including Eschar. 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 ([id number]) Procedures Wound #8 Wound #8 is a Venous Leg Ulcer located on the Right,Medial Lower Leg . There was a Skin/Subcutaneous Tissue Debridement ([id number]) debridement with total area of 0.5 sq cm performed by [name], MD. with the following instrument(s): Curette to remove Viable tissue/material including Eschar and Subcutaneous after achieving pain control using Other (lodocaine 4%). A time out was conducted at 14:15, prior to the start of the procedure. A Minimum amount of bleeding was controlled with Pressure. The procedure was tolerated well with a pain level of 0 throughout and a pain level of 0 following the procedure. Post Debridement Measurements: 0.5cm length x 1cm width x 0.2cm depth; 0.079cm^3 volume. Character of Wound/Ulcer Post Debridement requires further debridement. Severity of Tissue Post Debridement is: Fat layer exposed. Post procedure Diagnosis Wound #8: Same as Pre-Procedure Wound #9 Wound #9 is a Venous Leg Ulcer located on the Right,Posterior Lower Leg . There was a Skin/Subcutaneous Tissue Debridement ([id number])  debridement with total area of 7.5 sq cm performed by [name], MD. with the following instrument(s): Curette including Subcutaneous after achieving pain control using Other (lodocaine 4%). A time out was conducted at 14:15, prior to the start of the procedure. A Minimum amount of bleeding was controlled with Pressure. The procedure was tolerated well with a pain level of 3 throughout and a pain level of 0 following the procedure. Post Debridement Measurements: 3cm length x 2.5cm width x 0.2cm depth; 1.178cm^3 volume. Character of Wound/Ulcer Post Debridement requires further debridement. Severity of Tissue Post Debridement is: Fat layer  exposed. Post procedure Diagnosis Wound #9: Same as Pre-Procedure Plan Wound Cleansing: Wound #8 Right,Medial Lower Leg: Clean wound with Normal Saline. Cleanse wound with mild soap and water No tub bath. - give sink or bed baths Wound #9 Right,Posterior Lower Leg: Clean wound with Normal Saline. Cleanse wound with mild soap and water No tub bath. - give sink or bed baths Filippi, Kaslyn C. ([id number]) Anesthetic: Wound #8 Right,Medial Lower Leg: Topical Lidocaine 4% cream applied to wound bed prior to debridement - for clinic use Wound #9 Right,Posterior Lower Leg: Topical Lidocaine 4% cream applied to wound bed prior to debridement - for clinic use Primary Wound Dressing: Wound #8 Right,Medial Lower Leg: Hydrogel Wound #9 Right,Posterior Lower Leg: Hydrogel Secondary Dressing: Wound #8 Right,Medial Lower Leg: ABD pad Wound #9 Right,Posterior Lower Leg: ABD pad Dressing Change Frequency: Wound #8 Right,Medial Lower Leg: Other: - Saturday when patient takes shower. Wound #9 Right,Posterior Lower Leg: Other: - Saturday when patient takes shower. Follow-up Appointments: Wound #8 Right,Medial Lower Leg: Return Appointment in 1 week. Wound #9 Right,Posterior Lower Leg: Return Appointment in 1 week. Edema Control: Wound #8  Right,Medial Lower Leg: Kerlix and Coban - Bilateral Wound #9 Right,Posterior Lower Leg: Kerlix and Coban - Bilateral Additional Orders / Instructions: Wound #8 Right,Medial Lower Leg: Increase protein intake. Wound #9 Right,Posterior Lower Leg: Increase protein intake. #1 we'll change the primary dressing the Hydrofera Blue, ABD pads Kerlix and Coban bilaterally. Post debridement none of these wounds look particularly ominous. There is no evidence of surrounding infection. Although she is uncertain about how the new wound happened her skin is frail and from chronic venous insufficiency that the traumatic insult could've been reasonably trivial Electronic Signature(s) Signed: 06/26/2016 5:22:07 PM By: [name] MD PERSIS, GRAFFIUS ([id number]) Entered By: [name] on 06/26/2016 15:30:24 Oppenheimer, [name] ([id number]) -------------------------------------------------------------------------------- SuperBill Details Patient Name: [name] Date of Service: 06/26/2016 Medical Record Patient Account Number: [id number] Number: Treating RN: Cornell Barman 11-02-1934 (81 y.o. Other Clinician: Date of Birth/Sex: Female) Treating Imre Vecchione Primary Care Physician/Extender: Claudette Laws Physician: Suella Grove in Treatment: 31 Referring Physician: [name] Diagnosis Coding ICD-10 Codes Code Description [phone] Non-pressure chronic ulcer of other part of right lower leg with fat layer exposed Chronic venous hypertension (idiopathic) with ulcer and inflammation of bilateral lower I87.333 extremity I89.0 Lymphedema, not elsewhere classified W10.27 Chronic systolic (congestive) heart failure Facility Procedures CPT4: Description Modifier Quantity Code [id number] 11042 - DEB SUBQ TISSUE 20 SQ CM/< 1 ICD-10 Description Diagnosis L97.812 Non-pressure chronic ulcer of other part of right lower leg with fat layer exposed I87.333 Chronic venous hypertension   (idiopathic) with ulcer and inflammation of bilateral lower extremity Physician Procedures CPT4: Description Modifier Quantity Code [id number] - WC PHYS SUBQ TISS 20 SQ CM 1 ICD-10 Description Diagnosis L97.812 Non-pressure chronic ulcer of other part of right lower leg with fat layer exposed I87.333 Chronic venous hypertension (idiopathic)  with ulcer and inflammation of bilateral lower extremity Electronic Signature(s) Signed: 06/26/2016 5:22:07 PM By: [name] MD [name] ([id number]) Entered By: [name] on 06/26/2016 15:30:49

## 2016-06-28 ENCOUNTER — Encounter: Payer: Medicare Other | Admitting: Surgery

## 2016-06-28 NOTE — Progress Notes (Signed)
Location:      Place of Service:  SNF (31) Provider:  Toni Arthurs, NP-C  Rusty Aus, MD  Patient Care Team: Rusty Aus, MD as PCP - General (Internal Medicine) Robert Bellow, MD as Consulting Physician (General Surgery)  Extended Emergency Contact Information Primary Emergency Contact: [name],[name] Address: [address]          [place], [place] [address] [name] of [place] Phone: [phone] Relation: Daughter Secondary Emergency Contact: [name] [place] of [place] Mobile Phone: [phone] Relation: Son  Code Status:  DO NOT RESUSCITATE Goals of care: Advanced Directive information Advanced Directives 06/13/2016  Does Patient Have a Medical Advance Directive? No  Would patient like information on creating a medical advance directive? No - Patient declined     Chief Complaint  Patient presents with  . Routine Post Op  . Medication Management    HPI:  Pt is a 81 y.o. female seen today for a Routine post-op visit, for ongoing management of intractable pain and Coumadin dosing. Pain has been worsening as patient now has multiple compression fractures. Patient underwent kyphoplasty yesterday. She reports pain is somewhat better, but "is still there." Patient is on chronic Coumadin for DVT prophylaxis for mechanical heart valve and A. Fib. She was taken off Coumadin in preparation for the kyphoplasty. Since the surgery has been completed, we'll restart coumadin at the dose that was therapeutic prior to d/c. We will also bridge with daily doses of Lovenox until Coumadin is therapeutic. Pt verbalizes understanding of the plan of care.  Vital signs stable. No other complaints.   Past Medical History:  Diagnosis Date  . Arthritis    "back, hands, knees" (04/10/2016)  . Chronic atrial fibrillation ([place])    a. On Coumadin  . Chronic back pain   . Chronic combined systolic (congestive) and diastolic (congestive) heart failure    a. EF 25%  by echo in 08/2015 b. RHC in 08/2015 showed normal filling pressures  . Compression fracture    "several; all in my back" (04/10/2016)  . GERD (gastroesophageal reflux disease)   . Heart murmur   . Hypertension   . S/P MVR (mitral valve replacement)    a. MVR 1994 b. redo MVR in 08/2014 - on Coumadin  . Shortness of breath dyspnea    Past Surgical History:  Procedure Laterality Date  . BREAST LUMPECTOMY Right 2005?   benign lump excision  . CARDIAC CATHETERIZATION    . [place], 2016   "MVR; MVR"  . CATARACT EXTRACTION W/PHACO Right 02/20/2015   Procedure: CATARACT EXTRACTION PHACO AND INTRAOCULAR LENS PLACEMENT (IOC);  Surgeon: Estill Cotta, MD;  Location: [facility] ORS;  Service: Ophthalmology;  Laterality: Right;  US01:31.2 AP 25.3% CDE40.13 Fluid lot # [id number]  . ELECTROPHYSIOLOGIC STUDY N/A 10/09/2015   Procedure: CARDIOVERSION;  Surgeon: Wellington Hampshire, MD;  Location: [facility] OR[facility];  Service: Cardiovascular;  Laterality: N/A;  . IR GENERIC HISTORICAL  04/01/2016   IR RADIOLOGIST EVAL & MGMT 04/01/2016 [facility]  . IR GENERIC HISTORICAL  04/15/2016   IR VERTEBROPLASTY CERV/THOR BX INC UNI/BIL INC/INJECT/IMAGING 04/15/2016 Luanne Bras, MD [facility]  . IR GENERIC HISTORICAL  04/15/2016   IR VERTEBROPLASTY EA ADDL (T&LS) BX INC UNI/BIL INC INJECT/IMAGING 04/15/2016 Luanne Bras, MD [facility]  . IR GENERIC HISTORICAL  04/15/2016   IR VERTEBROPLASTY EA ADDL (T&LS) BX INC UNI/BIL INC INJECT/IMAGING 04/15/2016 Luanne Bras, MD [facility]  . IR GENERIC HISTORICAL  05/20/2016  IR RADIOLOGIST EVAL & MGMT 05/20/2016 [facility]  . IR GENERIC HISTORICAL  06/13/2016   IR VERTEBROPLASTY EA ADDL (T&LS) BX INC UNI/BIL INC INJECT/IMAGING 06/13/2016 Luanne Bras, MD [facility]  . IR GENERIC HISTORICAL  06/13/2016   IR SACROPLASTY BILATERAL 06/13/2016 Luanne Bras, MD [facility]  . IR GENERIC HISTORICAL  06/13/2016   IR  VERTEBROPLASTY LUMBAR BX INC UNI/BIL INC/INJECT/IMAGING 06/13/2016 Luanne Bras, MD [facility]  . IRRIGATION AND DEBRIDEMENT HEMATOMA Left 07/05/2015   Procedure: IRRIGATION AND DEBRIDEMENT HEMATOMA;  Surgeon: Robert Bellow, MD;  Location: [facility];  Service: General;  Laterality: Left;  . pyloric stenosis  07/1937  . US ECHOCARDIOGRAPHY      Allergies  Allergen Reactions  . Ace Inhibitors     Other reaction(s): Cough  . Hydrocodone Itching and Nausea Only  . Mercury     Other reaction(s): Unknown  . Nickel   . Silver Dermatitis    Severe itching  . Cephalexin Rash  . Clindamycin/Lincomycin Rash  . Penicillins Rash    Has patient had a PCN reaction causing immediate rash, facial/tongue/throat swelling, SOB or lightheadedness with hypotension: YES Has patient had a PCN reaction causing severe rash involving mucus membranes or skin necrosis: NO Has patient had a PCN reaction that required hospitalization NO Has patient had a PCN reaction occurring within the last 10 years: NO If all of the above answers are "NO", then may proceed with Cephalosporin use.    Allergies as of 06/14/2016      Reactions   Ace Inhibitors    Other reaction(s): Cough   Hydrocodone Itching, Nausea Only   Mercury    Other reaction(s): Unknown   Nickel    Silver Dermatitis   Severe itching   Cephalexin Rash   Clindamycin/lincomycin Rash   Penicillins Rash   Has patient had a PCN reaction causing immediate rash, facial/tongue/throat swelling, SOB or lightheadedness with hypotension: YES Has patient had a PCN reaction causing severe rash involving mucus membranes or skin necrosis: NO Has patient had a PCN reaction that required hospitalization NO Has patient had a PCN reaction occurring within the last 10 years: NO If all of the above answers are "NO", then may proceed with Cephalosporin use.      Medication List       Accurate as of 06/14/16 11:59 PM. Always use your most recent med list.           acetaminophen 500 MG tablet Commonly known as:  TYLENOL Take 500 mg by mouth every 6 (six) hours as needed for mild pain, moderate pain, fever or headache.   acetaminophen 325 MG tablet Commonly known as:  TYLENOL Take 650 mg by mouth 4 (four) times daily.   aspirin EC 81 MG tablet Take 81 mg by mouth daily.   calcium carbonate 500 MG chewable tablet Commonly known as:  TUMS - dosed in mg elemental calcium Chew 1 tablet by mouth every 4 (four) hours as needed for indigestion or heartburn.   diclofenac 1.3 % Ptch Commonly known as:  FLECTOR Place 1 patch onto the skin 2 (two) times daily. To the greatest pain in lower back   enoxaparin 40 MG/0.4ML injection Commonly known as:  LOVENOX Inject 40 mg into the skin daily.   feeding supplement (ENSURE ENLIVE) Liqd Take 237 mLs by mouth 2 (two) times daily between meals.   furosemide 40 MG tablet Commonly known as:  LASIX Take 40 mg by mouth 2 (two) times daily.  HM VITAMIN D3 4000 units Caps Generic drug:  Cholecalciferol Take 1 capsule by mouth daily.   KLOR-CON 10 10 MEQ tablet Generic drug:  potassium chloride Take 2 capsules by mouth daily.   liver oil-zinc oxide 40 % ointment Commonly known as:  DESITIN Apply 1 application topically as needed (bed sores).   methocarbamol 500 MG tablet Commonly known as:  ROBAXIN Take 250 mg by mouth 4 (four) times daily.   metoprolol succinate 25 MG 24 hr tablet Commonly known as:  TOPROL-XL Take 1 tablet (25 mg total) by mouth daily.   mirtazapine 15 MG tablet Commonly known as:  REMERON Take 15 mg by mouth at bedtime.   oxyCODONE 5 MG immediate release tablet Commonly known as:  Oxy IR/ROXICODONE Take 5 mg by mouth every 4 (four) hours as needed for moderate pain, severe pain or breakthrough pain.   oxyCODONE 5 MG immediate release tablet Commonly known as:  Oxy IR/ROXICODONE Take 1 tablet (5 mg total) by mouth every 4 (four) hours.   pantoprazole 40 MG  tablet Commonly known as:  PROTONIX Take 40 mg by mouth daily.   polyethylene glycol packet Commonly known as:  MIRALAX / GLYCOLAX Take 17 g by mouth 2 (two) times daily.   RISA-BID PROBIOTIC PO Take 1 capsule by mouth 2 (two) times daily.   senna-docusate 8.6-50 MG tablet Commonly known as:  Senokot-S Take 1 tablet by mouth 2 (two) times daily.   spironolactone 25 MG tablet Commonly known as:  ALDACTONE Take 25 mg by mouth 2 (two) times daily.   traZODone 50 MG tablet Commonly known as:  DESYREL Take 50 mg by mouth at bedtime as needed for sleep.   warfarin 5 MG tablet Commonly known as:  COUMADIN Take 1 tablet (5 mg total) by mouth daily.       Review of Systems  Constitutional: Negative for activity change, appetite change, chills, diaphoresis and fever.  Respiratory: Negative for apnea, cough, choking, chest tightness, shortness of breath and wheezing.   Cardiovascular: Negative for chest pain, palpitations and leg swelling.  Gastrointestinal: Negative for abdominal distention, abdominal pain, anal bleeding, blood in stool, constipation, diarrhea and nausea.  Genitourinary: Negative for difficulty urinating, dysuria, frequency, hematuria, urgency and vaginal bleeding.  Musculoskeletal: Positive for arthralgias (typical arthritis), back pain and myalgias. Negative for gait problem.  Skin: Positive for wound. Negative for color change, pallor and rash.  Neurological: Negative for dizziness, tremors, syncope, speech difficulty, weakness, numbness and headaches.  All other systems reviewed and are negative.    There is no immunization history on file for this patient. Pertinent  Health Maintenance Due  Topic Date Due  . DEXA SCAN  06/28/2002  . PNA vac Low Risk Adult (1 of 2 - PCV13) 06/28/2002  . INFLUENZA VACCINE  01/30/2016   Fall Risk  09/25/2015  Falls in the past year? No   Functional Status Survey:    Vitals:   06/14/16 0400  BP: (!) 153/72  Pulse: 86    Resp: 20  Temp: 98.2 F (36.8 C)  SpO2: 90%  Weight: 144 lb 8 oz (65.5 kg)   Body mass index is 21.65 kg/m. Physical Exam  Constitutional: She is oriented to person, place, and time. Vital signs are normal. She appears well-developed and well-nourished. She is active and cooperative. She does not appear ill. No distress.  HENT:  Head: Normocephalic and atraumatic.  Mouth/Throat: Uvula is midline, oropharynx is clear and moist and mucous membranes are normal. Mucous  membranes are not pale, not dry and not cyanotic.  Eyes: Conjunctivae, EOM and lids are normal. Pupils are equal, round, and reactive to light.  Neck: Trachea normal, normal range of motion and full passive range of motion without pain. Neck supple. No JVD present. No tracheal deviation, no edema and no erythema present. No thyromegaly present.  Cardiovascular: Normal rate, regular rhythm, normal heart sounds, intact distal pulses and normal pulses.  Exam reveals no gallop, no distant heart sounds and no friction rub.   No murmur heard. Pulmonary/Chest: Effort normal and breath sounds normal. No accessory muscle usage. No respiratory distress. She has no wheezes. She has no rales. She exhibits no tenderness.  Abdominal: Normal appearance and bowel sounds are normal. She exhibits no distension and no ascites. There is no tenderness.  Musculoskeletal: She exhibits no edema.       Lumbar back: She exhibits decreased range of motion (multiple compression fractures), tenderness, bony tenderness and pain.  Expected osteoarthritis, stiffness  Neurological: She is alert and oriented to person, place, and time. She has normal strength.  Skin: Skin is warm, dry and intact. Lesion (small, 2 cm diameter vascular wound with eschar on the posterior right lower leg with small 1 cm diameter satellite lesion proximal. No drainage or erythema) noted. No rash noted. She is not diaphoretic. No cyanosis or erythema. No pallor. Nails show no clubbing.   Psychiatric: She has a normal mood and affect. Her speech is normal and behavior is normal. Judgment and thought content normal. Cognition and memory are normal.  Nursing note and vitals reviewed.   Labs reviewed:  Recent Labs  06/23/16 0700 06/25/16 0615 06/27/16 0700  NA 137 136 136  K 5.1 4.4 4.7  CL 102 101 101  CO2 28 26 27   GLUCOSE 86 89 82  BUN 29* 31* 31*  CREATININE 1.40* 1.47* 1.51*  CALCIUM 10.0 10.3 10.2    Recent Labs  05/20/16 0435 05/21/16 1625 06/01/16 0500  AST 24 25 18   ALT 18 19 16   ALKPHOS 122 151* 129*  BILITOT 0.9 0.6 1.0  PROT 7.0 7.5 6.7  ALBUMIN 3.9 4.3 3.5    Recent Labs  06/23/16 0700 06/25/16 0615 06/27/16 0700  WBC 3.0* 4.0 3.3*  NEUTROABS 1.6 1.8 1.7  HGB 10.9* 11.6* 11.2*  HCT 33.1* 35.2 34.2*  MCV 84.5 83.1 83.1  PLT 173 193 205   Lab Results  Component Value Date   TSH 2.239 04/13/2016   No results found for: HGBA1C No results found for: CHOL, HDL, LDLCALC, LDLDIRECT, TRIG, CHOLHDL  Significant Diagnostic Results in last 30 days:  Ir Vertebroplasty Lumbar Bx Inc Uni/bil Inc Inject/imaging  Result Date: 06/14/2016 INDICATION: Severe lumbosacral and sacral pain due to compression fractures at L2, L4 and sacral insufficiency fractures. EXAM: IR VERTEBROPLASTY LUMBOSACRAL INJ; IR VERTEBROPLASTY ADDL INJECTION; IR SACRALPLASTY INJ BILAT<Procedure Description>IR VERTEBROPLASTY LUMBOSACRAL INJ; IR VERTEBROPLASTY ADDL INJECTION; IR SACRALPLASTY INJ BILAT MEDICATIONS: As antibiotic prophylaxis, vancomycin 1 g IV was ordered pre-procedure and administered intravenously within 1 hour of incision. ANESTHESIA/SEDATION: Moderate (conscious) sedation was employed during this procedure. A total of Versed 1.5 mg and Fentanyl 100 mcg was administered intravenously. Moderate Sedation Time: 45 minutes. The patient's level of consciousness and vital signs were monitored continuously by radiology nursing throughout the procedure under my direct  supervision. FLUOROSCOPY TIME:  Fluoroscopy Time: 8 minutes 42 seconds (7654 mGy) COMPLICATIONS: None immediate. TECHNIQUE: Informed written consent was obtained from the patient after a thorough discussion of the  procedural risks, benefits and alternatives. All questions were addressed. Maximal Sterile Barrier Technique was utilized including caps, mask, sterile gowns, sterile gloves, sterile drape, hand hygiene and skin antiseptic. A timeout was performed prior to the initiation of the procedure. Prior to the procedure, extensive discussions are were held with the patient and the patient's daughter in regards to the levels being treated. Questions were answered to their satisfaction. The plan was to do vertebral body augmentations at L2, L4 and S1. PROCEDURE: The patient was placed prone on the fluoroscopic table. Nasal oxygen was administered. Physiologic monitoring was performed throughout the duration of the procedure. The skin overlying the lumbosacral region was prepped and draped in the usual sterile fashion. The L2 and L4 vertebral bodies were identified in the right pedicle. Both L2 and L4 were infiltrated with 0.25% Bupivacaine. This was then followed by the advancement of a 13-gauge Cook needle through the right pedicle at L2 and L4 pedicle into the anterior one-third at both levels. A gentle contrast injection demonstrated a trabecular pattern of contrast, with early opacification of an anterior paraspinous vein at the level of L4. The skin entry sites for the sacroplasty were also infiltrated with 0.25% bupivacaine followed by the advancement of 13 gauge Cook spinal needles into the anterior one-third at L1 bilaterally. A gentle contrast injection demonstrated early presacral veins which necessitated the use of Gel-Foam pledgets through the 13 gauge Cook spinal needles prior to the delivery of the methylmethacrylate mixture. At this time, methylmethacrylate mixture was reconstituted. Under biplane  intermittent fluoroscopy, the methylmethacrylate was then injected into the L2 and L4 vertebral bodies with filling of the vertebral bodies. Also the methylmethacrylate mixture was injected at S1 under biplane intermittent fluoroscopy. Excellent filling was obtained at the levels treated. No extravasation was noted into the disk spaces or posteriorly into the spinal canal. No epidural venous contamination was seen. The needles were then removed. Hemostasis was achieved at the skin entry sites. There were no acute complications. The patient tolerated the procedure well. The patient was observed for 3 hours and discharged in good condition. IMPRESSION: 1. Status post vertebral body augmentation for painful compression fracture at L2, L4 and S1 using vertebroplasty technique. Following the procedure, the patient did complain of low back pain. She and her daughter were informed to continue with her previous pain medications as prescribed for the pain as needed. Instructions were provided to the daughter regarding post procedural care. Questions were answered to their satisfaction. They were asked to call should they have any concerns or questions. Electronically Signed   By: Luanne Bras M.D.   On: 06/13/2016 15:26   Ir Vertebroplasty Ea Addl (t&ls) Bx Inc Uni/bil Inc Inject/imaging  Result Date: 06/14/2016 INDICATION: Severe lumbosacral and sacral pain due to compression fractures at L2, L4 and sacral insufficiency fractures. EXAM: IR VERTEBROPLASTY LUMBOSACRAL INJ; IR VERTEBROPLASTY ADDL INJECTION; IR SACRALPLASTY INJ BILAT<Procedure Description>IR VERTEBROPLASTY LUMBOSACRAL INJ; IR VERTEBROPLASTY ADDL INJECTION; IR SACRALPLASTY INJ BILAT MEDICATIONS: As antibiotic prophylaxis, vancomycin 1 g IV was ordered pre-procedure and administered intravenously within 1 hour of incision. ANESTHESIA/SEDATION: Moderate (conscious) sedation was employed during this procedure. A total of Versed 1.5 mg and Fentanyl 100  mcg was administered intravenously. Moderate Sedation Time: 45 minutes. The patient's level of consciousness and vital signs were monitored continuously by radiology nursing throughout the procedure under my direct supervision. FLUOROSCOPY TIME:  Fluoroscopy Time: 8 minutes 42 seconds (4665 mGy) COMPLICATIONS: None immediate. TECHNIQUE: Informed written consent was obtained from the patient  after a thorough discussion of the procedural risks, benefits and alternatives. All questions were addressed. Maximal Sterile Barrier Technique was utilized including caps, mask, sterile gowns, sterile gloves, sterile drape, hand hygiene and skin antiseptic. A timeout was performed prior to the initiation of the procedure. Prior to the procedure, extensive discussions are were held with the patient and the patient's daughter in regards to the levels being treated. Questions were answered to their satisfaction. The plan was to do vertebral body augmentations at L2, L4 and S1. PROCEDURE: The patient was placed prone on the fluoroscopic table. Nasal oxygen was administered. Physiologic monitoring was performed throughout the duration of the procedure. The skin overlying the lumbosacral region was prepped and draped in the usual sterile fashion. The L2 and L4 vertebral bodies were identified in the right pedicle. Both L2 and L4 were infiltrated with 0.25% Bupivacaine. This was then followed by the advancement of a 13-gauge Cook needle through the right pedicle at L2 and L4 pedicle into the anterior one-third at both levels. A gentle contrast injection demonstrated a trabecular pattern of contrast, with early opacification of an anterior paraspinous vein at the level of L4. The skin entry sites for the sacroplasty were also infiltrated with 0.25% bupivacaine followed by the advancement of 13 gauge Cook spinal needles into the anterior one-third at L1 bilaterally. A gentle contrast injection demonstrated early presacral veins which  necessitated the use of Gel-Foam pledgets through the 13 gauge Cook spinal needles prior to the delivery of the methylmethacrylate mixture. At this time, methylmethacrylate mixture was reconstituted. Under biplane intermittent fluoroscopy, the methylmethacrylate was then injected into the L2 and L4 vertebral bodies with filling of the vertebral bodies. Also the methylmethacrylate mixture was injected at S1 under biplane intermittent fluoroscopy. Excellent filling was obtained at the levels treated. No extravasation was noted into the disk spaces or posteriorly into the spinal canal. No epidural venous contamination was seen. The needles were then removed. Hemostasis was achieved at the skin entry sites. There were no acute complications. The patient tolerated the procedure well. The patient was observed for 3 hours and discharged in good condition. IMPRESSION: 1. Status post vertebral body augmentation for painful compression fracture at L2, L4 and S1 using vertebroplasty technique. Following the procedure, the patient did complain of low back pain. She and her daughter were informed to continue with her previous pain medications as prescribed for the pain as needed. Instructions were provided to the daughter regarding post procedural care. Questions were answered to their satisfaction. They were asked to call should they have any concerns or questions. Electronically Signed   By: Luanne Bras M.D.   On: 06/13/2016 15:26   Ir Sacroplasty Bilateral  Result Date: 06/14/2016 INDICATION: Severe lumbosacral and sacral pain due to compression fractures at L2, L4 and sacral insufficiency fractures. EXAM: IR VERTEBROPLASTY LUMBOSACRAL INJ; IR VERTEBROPLASTY ADDL INJECTION; IR SACRALPLASTY INJ BILAT<Procedure Description>IR VERTEBROPLASTY LUMBOSACRAL INJ; IR VERTEBROPLASTY ADDL INJECTION; IR SACRALPLASTY INJ BILAT MEDICATIONS: As antibiotic prophylaxis, vancomycin 1 g IV was ordered pre-procedure and administered  intravenously within 1 hour of incision. ANESTHESIA/SEDATION: Moderate (conscious) sedation was employed during this procedure. A total of Versed 1.5 mg and Fentanyl 100 mcg was administered intravenously. Moderate Sedation Time: 45 minutes. The patient's level of consciousness and vital signs were monitored continuously by radiology nursing throughout the procedure under my direct supervision. FLUOROSCOPY TIME:  Fluoroscopy Time: 8 minutes 42 seconds (2841 mGy) COMPLICATIONS: None immediate. TECHNIQUE: Informed written consent was obtained from the patient after  a thorough discussion of the procedural risks, benefits and alternatives. All questions were addressed. Maximal Sterile Barrier Technique was utilized including caps, mask, sterile gowns, sterile gloves, sterile drape, hand hygiene and skin antiseptic. A timeout was performed prior to the initiation of the procedure. Prior to the procedure, extensive discussions are were held with the patient and the patient's daughter in regards to the levels being treated. Questions were answered to their satisfaction. The plan was to do vertebral body augmentations at L2, L4 and S1. PROCEDURE: The patient was placed prone on the fluoroscopic table. Nasal oxygen was administered. Physiologic monitoring was performed throughout the duration of the procedure. The skin overlying the lumbosacral region was prepped and draped in the usual sterile fashion. The L2 and L4 vertebral bodies were identified in the right pedicle. Both L2 and L4 were infiltrated with 0.25% Bupivacaine. This was then followed by the advancement of a 13-gauge Cook needle through the right pedicle at L2 and L4 pedicle into the anterior one-third at both levels. A gentle contrast injection demonstrated a trabecular pattern of contrast, with early opacification of an anterior paraspinous vein at the level of L4. The skin entry sites for the sacroplasty were also infiltrated with 0.25% bupivacaine followed  by the advancement of 13 gauge Cook spinal needles into the anterior one-third at L1 bilaterally. A gentle contrast injection demonstrated early presacral veins which necessitated the use of Gel-Foam pledgets through the 13 gauge Cook spinal needles prior to the delivery of the methylmethacrylate mixture. At this time, methylmethacrylate mixture was reconstituted. Under biplane intermittent fluoroscopy, the methylmethacrylate was then injected into the L2 and L4 vertebral bodies with filling of the vertebral bodies. Also the methylmethacrylate mixture was injected at S1 under biplane intermittent fluoroscopy. Excellent filling was obtained at the levels treated. No extravasation was noted into the disk spaces or posteriorly into the spinal canal. No epidural venous contamination was seen. The needles were then removed. Hemostasis was achieved at the skin entry sites. There were no acute complications. The patient tolerated the procedure well. The patient was observed for 3 hours and discharged in good condition. IMPRESSION: 1. Status post vertebral body augmentation for painful compression fracture at L2, L4 and S1 using vertebroplasty technique. Following the procedure, the patient did complain of low back pain. She and her daughter were informed to continue with her previous pain medications as prescribed for the pain as needed. Instructions were provided to the daughter regarding post procedural care. Questions were answered to their satisfaction. They were asked to call should they have any concerns or questions. Electronically Signed   By: Luanne Bras M.D.   On: 06/13/2016 15:26    Assessment/Plan 1. Pain, acute pot-operative  DC Oxycodone 5 mg po QID  Start Oxycodone 10 mg po QID scheduled  Continue Oxycodone 5 mg po Q 4 hours prn  Methocarbamol 500 mg one half tablet by mouth 4 times a day scheduled  Continue Flector patch   Continue Tylenol 650 mg by mouth 4 times a day scheduled  2.  Encounter for therapeutic drug monitoring  Restart Coumadin at 4.5 mg po daily  Begin Lovenox 40 mg subcutaneous daily for DVT prophylaxis   Continue Lovenox regimen until Coumadin is at a therapeutic level  Monitor PT/INR   Family/ staff Communication:   Total Time:  Documentation:  Face to Face:  Family/Phone:   Labs/tests ordered:    Medication list reviewed and assessed for continued appropriateness.  Vikki Ports, NP-C [facility]  [facility] [address], [place] [address] Cell Phone (Mon-Fri 8am-5pm):  [phone] On Call:  [phone] & follow prompts after 5pm & weekends Office Phone:  [phone] Office Fax:  [phone]

## 2016-06-28 NOTE — Progress Notes (Addendum)
Carrie, [name] ([id number]) Visit Report for 06/28/2016 HPI Details Patient Name: [name], [name] 06/28/2016 11:30 Date of Service: AM Medical Record [id number] Number: Patient Account Number: [id number] August 05, 1934 (81 y.o. Treating RN: [name] Date of Birth/Sex: Female) Other Clinician: Primary Care Treating [name] Physician: Physician/Extender: Referring Physician: Melina [name] in Treatment: 31 History of Present Illness HPI Description: 11/22/15; this is [name] is a 81 year old woman who lives at home on her own. According to the patient and her daughter was present she has had long-standing edema in her legs dating back many years. She also has a history of chronic systolic heart failure, atrial fibrillation and is status post mitral valve replacement. Last echocardiogram I see in [facility] showed an ejection fraction of 40-45% she is on Lasix 60 mg a day and spironolactone 25 mg a day. Her current problem began in December around Christmas time she developed a small hematoma in the medial part of her left leg which rapidly expanded to a very large hematoma that required surgical debridement. This situation was complicated by the fact that the patient is on long-standing Coumadin for mechanical heart valve. She went to the OR had this evacuated on January 4 /17. The wound has gradually improved however she has developed a small wounds around this area and more recently a wound on the right lateral leg. She is weeping edema fluid. The patient is already been to see vascular surgery. It was recommended that she wear Unna boots, she did not tolerate this due to pain in the left leg. She was then prescribed Juzo stockings and really can't get these on herself although truthfully there is probably too much edema for a Juzo stockings currently. She is not a diabetic and has no history of PAD or claudication that I could elicit. She does not  use the external compression pumps reliably. She comes today with notes from her primary physician and Dr. Ronalee [name] both recommending various forms of compression but the patient does not really complied with them. Has been using the external compression pumps with some regularity but certainly not daily on the right leg and this has helped. I also note that her daughter tells me the history that she does not sleep in bed at home. She has a hospital bed but with her legs up she finds this painful so she sleeps in the couch sitting up with her legs dependent. 11/29/15; the patient is arrives today accompanied by her son. He expresses satisfaction that she is maintained the compression all week. 12/06/15; the patient has kept her Profore light wraps on, we have good edema control no major change in the wounds we have been using Aquacel 12/13/15; changed to RTD last week. One of the 3 wounds on her left medial leg is healed she has 2 remaining wounds here and one on the right lateral leg. 12/18/2015 -- the patient was at Dr. Nino Parsley office today and he was seeing her for an arterial study. While the wrap was being removed she had an inadvertent laceration of the left proximal anterior leg which bled quite profusely and a compression dressing was applied over this and the patient was here to get her Profore wraps done. I was asked to see the patient to make an assessment and treat appropriately. 12/26/15; the patient's injury on the left proximal leg and Steri-Strips removed after soaking. There is an open area here. The original wounds to still open on the right lateral and left  medial leg. 01/03/16 patient's injury on her proximal left leg looks quite good. Still small open area on the medial left leg Davtyan, Ayeshia C. ([id number]) which appears to be improving. The area on the right lateral leg still substantially open with no real improvement in wound depth. Her edema control is marginal with a  Profore light. We have been using RTD for 3-4 weeks without any major change 01/10/16: wounds without s/s of infection. vascular results are pending regarding arterial studies. 01/17/16; patient comes in today complaining of severe pain however I think most of this is in the right hip not related to her wounds. She continues with a oval-shaped wound on the right lateral leg, trauma to the left anterior leg just below her tibial plateau. She has a smaller eschar on the left anterior leg. 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 The area which was a scissor injury on her left upper anterior lower leg is fully epithelialized. She only has a small open area remaining on the medial aspect of her left leg. The oval-shaped wound on the right mid lateral leg may be a bit smaller. Debrided of surface slough nonviable subcutaneous tissue. I had changed to collagen 2 weeks ago in an attempt to get this to close 01/31/16 all the patient's wounds on her left leg give healed. We have good edema control with bilateral Profore lites which she has been compliant with. She still has the oval-shaped wound on the right lateral calf allergic even this appears to be slowly improving. The patient has juxtalite stockings at home. She states she thinks she can put these on. She also has external compression pumps at home although I think her compliance with this has not been good in the past. She complains today of edema in her thighs. Tells me she takes 40 mg of Lasix daily 02/21/16; only 1 small wound remains on the lateral aspect of the right calf. She is using Juzo stockings on the left leg although she complains about difficulty in applying them. She has external compression pumps at home 02/28/16;  the small open wound on her right lateral calf is improved in terms of wound area. It appears that she has a wrap injury on the anterior aspect of the upper leg 03/06/16; the small open wound on her right lateral calf has a very small open area remaining. The wrap injury on the anterior aspect of the upper leg also appears better. She arrives today in clinic with a history of dyspnea with minimal exertion starting with the last 2-4 days. She does not describe chest pain. Her son and our intake nurse think she has facial swelling. She has a history of an artificial mitral valve on Coumadin 03/13/16; the small wound on the right lateral calf is no better this week. The superior wrap injury anteriorly requires debridement of surface eschar and nonviable subcutaneous tissue. She has been to her primary doctor with regards to her dyspnea we identified last week. Per the patient's description her Lasix has been increased 03/21/2016 -- patient of Dr. Dellia Nims who could not keep her appointment yesterday but has come in today with the right lower leg looking good and this is the leg which has been treated in the recent past. However, her  left lower extremity is extremely swollen, tender and edematous with redness and discoloration. 03/27/16; there is only 2 small areas remain on the right leg. The edema that was so concerning last week has come down however there is extensive bruising on the lateral left leg medial left foot suggesting that she lost some blood into the leg itself. I checked her hemoglobin on 9/21 was 14.5. Her INR was over 6. Duplex ultrasound was negative for DVT 04/03/16 at this point in time today patient is actually doing substantially better in regard to the wound on the anterior right lower extremity. currently there is no slough or eschar noted and no evidence of erythema, discharge, or local infection. She is exhibiting no signs of systemic infection. She is tolerating the  dressing changes as well as the wrapping at this point in time. 04/24/16; I have not seen Mrs. Everetts for 3 weeks. Apparently she was admitted to hospital for respiratory issues/also apparently has had multiple compression fractures and had kyphoplasty. When she was last here she only had one small open area remaining on the left leg she was using juxtalite stockings. Her son is upset about restarting the Coumadin which she blames or the swelling in her legs. She is on Coumadin Pinnix, Israel C. ([id number]) for prophylaxis with a chronic artificial heart valve. 04/30/16 at this point in time patient has been tolerating the 3 layer compression wrap as well as the calcium alginate. 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 With tthat being said she does have the continued wound of the left lower extremity as well as the right medial lower extremity. The right side is significantly smaller compared to the left but actually is slough covered. fortunately she has no significant tenderness at rest although with manipulation of the right location of the wound this is significantly tender. 05/07/16 for follow-up evaluation today both the patient's wounds appear to be significantly improved in size compared to last week. She is also having less pain at both locations currently. Her pain level at most is related to be a 1-2 out of 10. She does have some discharge but fortunately no evidence of infection at this point.she also continues to tolerate the compression wraps well. 05/14/16; the patient's wound on the left leg actually looks as though it's on its way to closure. She is using collagen to this area. She has a small punched out area over the right medial calf. The cause of this is not really clear it has probably 0.4 cm of depth. She has an IV in her right hand which she states is for IV fluid when she develops low sodiums her potassiums, the etiology of this is not clear 05-21-16 she  presents today with continued ulcerations the right lower extremity, posterior aspect. she has been using Iodoflex and compression therapy to the area. She denies any new injuries or trauma. She does have 2 areas proximal to this ulceration of dry crust along with an area to her left posterior lower leg that is purple discolored area appears similar to how her current ulceration started, she denies any trauma or injury to the left posterior leg we will monitor all of these areas. 05/28/16; the patient is down to 1 small open area on the right medial mid calf. This is however larger and in 50% of the surface area deeper approximately 0.4 cm. The reason for this deterioration is unclear. I've gone ahead and done a culture of this area she also tells [place]  today that she is short of breath. She is also noted a swelling on her lower left eyelid 06/05/16; the patient is 1 small open area on the right medial mid calf. This looks about the same as last week. The deep area, medial 50% of the wound looks about the same. CandS I did last week was negative. The entire area looks about the same 06/12/16; not much change in this over the course of the last week. Patient comes in today complaining of extreme back pain. Apparently she has either a kyphoplasty or vertebral plasty scheduled at [facility] radiology tomorrow. For this reason no debridement today. We have been using Iodoflex 06/19/16; patient arrived with the surface eschar from last week removed with a curet debrided of subcutaneous tissue. She tolerates this reasonably well. She is still in a skilled facility. States her vertebral plasty last week is made her pain bearable 06/26/16; patient arrived with a new wound on the posterior aspect of her left calf. She notices 3 or 4 days ago but is not certain how this happened. She states she simply felt a stinging sensation and one day on her foot and then the next day on the back of her calf. 06/28/2016  -- Ms. Reubin Milan called urgently saying that the pain in her left foot was drastically worse over the last several hours and though she removed her compression wrap she wanted to be seen before the long holiday. Electronic Signature(s) Signed: 06/28/2016 12:04:05 PM By: Christin Fudge MD, FACS Entered By: Christin Fudge on 06/28/2016 12:04:05 [name] ([id number]) -------------------------------------------------------------------------------- Physical Exam Details Patient Name: [name], [name] 06/28/2016 11:30 Date of Service: AM Medical Record [id number] Number: Patient Account Number: [id number] [birth year] (81 y.o. Treating RN: [name] Date of Birth/Sex: Female) Other Clinician: Primary Care Treating [name] Physician: Physician/Extender: Referring Physician: Melina [name] in Treatment: 31 Constitutional . Pulse regular. Respirations normal and unlabored. Afebrile. . Eyes Nonicteric. Reactive to light. Ears, Nose, Mouth, and Throat Lips, teeth, and gums WNL.Marland Kitchen Moist mucosa without lesions. Neck supple and nontender. No palpable supraclavicular or cervical adenopathy. Normal sized without goiter. Respiratory WNL. No retractions.. Cardiovascular Pedal Pulses WNL. No clubbing, cyanosis or edema. Lymphatic No adneopathy. No adenopathy. No adenopathy. Musculoskeletal Adexa without tenderness or enlargement.. Digits and nails w/o clubbing, cyanosis, infection, petechiae, ischemia, or inflammatory conditions.. Integumentary (Hair, Skin) No suspicious lesions. No crepitus or fluctuance. No peri-wound warmth or erythema. No masses.Marland Kitchen Psychiatric Judgement and insight Intact.. No evidence of depression, anxiety, or agitation.. Notes the left lower extremity does not show any signs of acute cellulitis or arterial insufficiency. Her ankle is swollen a bit medially possibly due to the wrap being malpositioned and she has a bit of edema just  below her knee where the wrap was not placed. Her chronic changes in the left lower extremity have been there for many years and this is not new. The actual wound on the left posterior calf is dry and has an eschar but she does not want me to touch this. Electronic Signature(s) Signed: 06/28/2016 12:05:03 PM By: Christin Fudge MD, FACS Entered By: Christin Fudge on 06/28/2016 12:05:03 [name], [name] ([id number]) [name], [name] ([id number]) -------------------------------------------------------------------------------- Physician Orders Details Patient Name: KASSIDY, DOCKENDORF 06/28/2016 11:30 Date of Service: AM Medical Record [id number] Number: Patient Account Number: [id number] Apr 09, 1935 (81 y.o. Treating RN: [name] Date of Birth/Sex: Female) Other Clinician: Primary Care Treating [name] Physician: Physician/Extender: Referring Physician: Melina [name] in  Treatment: 31 Verbal / Phone Orders: No Diagnosis Coding Wound Cleansing Wound #9 Left,Posterior Lower Leg o Clean wound with Normal Saline. Anesthetic Wound #9 Left,Posterior Lower Leg o Topical Lidocaine 4% cream applied to wound bed prior to debridement Primary Wound Dressing Wound #9 Left,Posterior Lower Leg o Hydrogel Secondary Dressing o Boardered Foam Dressing Dressing Change Frequency Wound #9 Left,Posterior Lower Leg o Change dressing every other day. Follow-up Appointments Wound #8 Right,Medial Lower Leg o Return Appointment in 1 week. Wound #9 Left,Posterior Lower Leg o Return Appointment in 1 week. Edema Control o Patient to wear own Juxtalite/Juzo compression garment. - Left leg Electronic Signature(s) Signed: 06/28/2016 4:27:48 PM By: Christin Fudge MD, FACS Signed: 06/28/2016 5:31:46 PM By: Gretta Cool RN, BSN, Kim RN, BSN [address], [place] ([id number]) Entered By: Gretta Cool RN, BSN, Kim on 06/28/2016 12:08:19 CLARIECE, ROESLER  ([id number]) -------------------------------------------------------------------------------- Progress Note Details Patient Name: ANGLIA, BLAKLEY 06/28/2016 11:30 Date of Service: AM Medical Record [id number] Number: Patient Account Number: [id number] 10-31-1934 (81 y.o. Treating RN: [name] Date of Birth/Sex: Female) Other Clinician: Primary Care Treating [name] Physician: Physician/Extender: Referring Physician: Melina [name] in Treatment: 31 Subjective History of Present Illness (HPI) 11/22/15; this is Herbison is a 81 year old woman who lives at home on her own. According to the patient and her daughter was present she has had long-standing edema in her legs dating back many years. She also has a history of chronic systolic heart failure, atrial fibrillation and is status post mitral valve replacement. Last echocardiogram I see in [facility] showed an ejection fraction of 40-45% she is on Lasix 60 mg a day and spironolactone 25 mg a day. Her current problem began in December around Christmas time she developed a small hematoma in the medial part of her left leg which rapidly expanded to a very large hematoma that required surgical debridement. This situation was complicated by the fact that the patient is on long-standing Coumadin for mechanical heart valve. She went to the OR had this evacuated on January 4 /17. The wound has gradually improved however she has developed a small wounds around this area and more recently a wound on the right lateral leg. She is weeping edema fluid. The patient is already been to see vascular surgery. It was recommended that she wear Unna boots, she did not tolerate this due to pain in the left leg. She was then prescribed Juzo stockings and really can't get these on herself although truthfully there is probably too much edema for a Juzo stockings currently. She is not a diabetic and has no history of PAD or  claudication that I could elicit. She does not use the external compression pumps reliably. She comes today with notes from her primary physician and Dr. Ronalee [name] both recommending various forms of compression but the patient does not really complied with them. Has been using the external compression pumps with some regularity but certainly not daily on the right leg and this has helped. I also note that her daughter tells me the history that she does not sleep in bed at home. She has a hospital bed but with her legs up she finds this painful so she sleeps in the couch sitting up with her legs dependent. 11/29/15; the patient is arrives today accompanied by her son. He expresses satisfaction that she is maintained the compression all week. 12/06/15; the patient has kept her Profore light wraps on, we have good edema control no major change in the wounds  we have been using Aquacel 12/13/15; changed to RTD last week. One of the 3 wounds on her left medial leg is healed she has 2 remaining wounds here and one on the right lateral leg. 12/18/2015 -- the patient was at Dr. Nino Parsley office today and he was seeing her for an arterial study. While the wrap was being removed she had an inadvertent laceration of the left proximal anterior leg which bled quite profusely and a compression dressing was applied over this and the patient was here to get her Profore wraps done. I was asked to see the patient to make an assessment and treat appropriately. 12/26/15; the patient's injury on the left proximal leg and Steri-Strips removed after soaking. There is an open area here. The original wounds to still open on the right lateral and left medial leg. 01/03/16 patient's injury on her proximal left leg looks quite good. Still small open area on the medial left leg which appears to be improving. The area on the right lateral leg still substantially open with no real improvement in wound depth. Her edema control is  marginal with a Profore light. We have been using RTD for 3-4 weeks without any major change [name], [name] ([id number]) 01/10/16: wounds without s/s of infection. vascular results are pending regarding arterial studies. 01/17/16; patient comes in today complaining of severe pain however I think most of this is in the right hip not related to her wounds. She continues with a oval-shaped wound on the right lateral leg, trauma to the left anterior leg just below her tibial plateau. She has a smaller eschar on the left anterior leg. 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 The area which was a scissor injury on her left upper anterior lower leg is fully epithelialized. She only has a small open area remaining on the medial aspect of her left leg. The oval-shaped wound on the right mid lateral leg may be a bit smaller. Debrided of surface slough nonviable subcutaneous tissue. I had changed to collagen 2 weeks ago in an attempt to get this to close 01/31/16 all the patient's wounds on her left leg give healed. We have good edema control with bilateral Profore lites which she has been compliant with. She still has the oval-shaped wound on the right lateral calf allergic even this appears to be slowly improving. The patient has juxtalite stockings at home. She states she thinks she can put these on. She also has external compression pumps at home although I think her compliance with this has not been good in the past. She complains today of edema in her thighs. Tells me she takes 40 mg of Lasix daily 02/21/16; only 1 small wound remains on the lateral aspect of the right calf. She is using Juzo stockings on the left leg although she complains about difficulty in applying them. She  has external compression pumps at home 02/28/16; the small open wound on her right lateral calf is improved in terms of wound area. It appears that she has a wrap injury on the anterior aspect of the upper leg 03/06/16; the small open wound on her right lateral calf has a very small open area remaining. The wrap injury on the anterior aspect of the upper leg also appears  better. She arrives today in clinic with a history of dyspnea with minimal exertion starting with the last 2-4 days. She does not describe chest pain. Her son and our intake nurse think she has facial swelling. She has a history of an artificial mitral valve on Coumadin 03/13/16; the small wound on the right lateral calf is no better this week. The superior wrap injury anteriorly requires debridement of surface eschar and nonviable subcutaneous tissue. She has been to her primary doctor with regards to her dyspnea we identified last week. Per the patient's description her Lasix has been increased 03/21/2016 -- patient of Dr. Dellia Nims who could not keep her appointment yesterday but has come in today with the right lower leg looking good and this is the leg which has been treated in the recent past. However, her left lower extremity is extremely swollen, tender and edematous with redness and discoloration. 03/27/16; there is only 2 small areas remain on the right leg. The edema that was so concerning last week has come down however there is extensive bruising on the lateral left leg medial left foot suggesting that she lost some blood into the leg itself. I checked her hemoglobin on 9/21 was 14.5. Her INR was over 6. Duplex ultrasound was negative for DVT 04/03/16 at this point in time today patient is actually doing substantially better in regard to the wound on the anterior right lower extremity. currently there is no slough or eschar noted and no evidence of erythema, discharge, or local infection. She is exhibiting no signs of  systemic infection. She is tolerating the dressing changes as well as the wrapping at this point in time. 04/24/16; I have not seen Mrs. Paddack for 3 weeks. Apparently she was admitted to hospital for respiratory issues/also apparently has had multiple compression fractures and had kyphoplasty. When she was last here she only had one small open area remaining on the left leg she was using juxtalite stockings. Her son is upset about restarting the Coumadin which she blames or the swelling in her legs. She is on Coumadin for prophylaxis with a chronic artificial heart valve. 04/30/16 at this point in time patient has been tolerating the 3 layer compression wrap as well as the [name], [name] ([id number]) calcium alginate. 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 With tthat being said she does have the continued wound of the left lower extremity as well as the right medial lower extremity. The right side is significantly smaller compared to the left but actually is slough covered. fortunately she has no significant tenderness at rest although with manipulation of the right location of the wound this is significantly tender. 05/07/16 for follow-up evaluation today both the patient's wounds appear to be significantly improved in size compared to last week. She is also having less pain at both locations currently. Her pain level at most is related to be a 1-2 out of 10. She does have some discharge but fortunately no evidence of infection at this point.she also continues to tolerate the compression wraps well. 05/14/16; the patient's wound on the left leg actually looks as though it's on its way to closure. She is using collagen to this area. She has a small punched out area over the right medial calf. The cause of this is not really clear it has probably 0.4 cm of depth. She has an IV in her right hand which she states is for IV fluid when she develops low sodiums her potassiums, the etiology  of this is not clear 05-21-16 she presents today with continued ulcerations the right lower extremity, posterior aspect. she has been using Iodoflex and compression therapy to the area. She denies any new injuries or trauma. She does have 2 areas proximal to this ulceration of dry crust along with an area to her left posterior lower leg that is purple discolored area appears similar to how her current ulceration started, she denies any trauma or injury to the left posterior leg we will monitor all of these areas. 05/28/16; the patient is down to 1 small open area on the right medial mid calf. This is however larger and in 50% of the surface area deeper approximately 0.4 cm. The reason for this deterioration is unclear. I've gone ahead and done a culture of this area she also tells [place] today that she is short of breath. She is also noted a swelling on her lower left eyelid 06/05/16; the patient is 1 small open area on the right medial mid calf. This looks about the same as last week. The deep area, medial 50% of the wound looks about the same. CandS I did last week was negative. The entire area looks about the same 06/12/16; not much change in this over the course of the last week. Patient comes in today complaining of extreme back pain. Apparently she has either a kyphoplasty or vertebral plasty scheduled at [facility] radiology tomorrow. For this reason no debridement today. We have been using Iodoflex 06/19/16; patient arrived with the surface eschar from last week removed with a curet debrided of subcutaneous tissue. She tolerates this reasonably well. She is still in a skilled facility. States her vertebral plasty last week is made her pain bearable 06/26/16; patient arrived with a new wound on the posterior aspect of her left calf. She notices 3 or 4 days ago but is not certain how this happened. She states she simply felt a stinging sensation and one day on her foot and then the next day on  the back of her calf. 06/28/2016 -- Ms. Reubin Milan called urgently saying that the pain in her left foot was drastically worse over the last several hours and though she removed her compression wrap she wanted to be seen before the long holiday. Objective Constitutional Pulse regular. Respirations normal and unlabored. Afebrile. Vitals Time Taken: 11:33 AM, Height: 68 in, Weight: 178 lbs, BMI: 27.1, Temperature: 97.9 F, Pulse: 85 bpm, Respiratory Rate: 16 breaths/min, Blood Pressure: 119/58 mmHg. [name], [name] ([id number]) Eyes Nonicteric. Reactive to light. Ears, Nose, Mouth, and Throat Lips, teeth, and gums WNL.Marland Kitchen Moist mucosa without lesions. Neck supple and nontender. No palpable supraclavicular or cervical adenopathy. Normal sized without goiter. Respiratory WNL. No retractions.. Cardiovascular Pedal Pulses WNL. No clubbing, cyanosis or edema. Lymphatic No adneopathy. No adenopathy. No adenopathy. Musculoskeletal Adexa without tenderness or enlargement.. Digits and nails w/o clubbing, cyanosis, infection, petechiae, ischemia, or inflammatory conditions.Marland Kitchen Psychiatric Judgement and insight Intact.. No evidence of depression, anxiety, or agitation.. General Notes: the left lower extremity does not show any signs of acute cellulitis or arterial insufficiency. Her ankle is swollen a bit medially possibly due to the wrap being malpositioned and she has a bit of edema just below her knee where the wrap was not placed. Her chronic changes in the left lower extremity have been there for many years and this is not new. The actual wound on the left posterior calf is dry and has an eschar but she does not want me to  touch this. Integumentary (Hair, Skin) No suspicious lesions. No crepitus or fluctuance. No peri-wound warmth or erythema. No masses.. Wound #9 status is Open. Original cause of wound was Gradually Appeared. The wound is located on the Left,Posterior Lower Leg. The wound  measures 1.8cm length x 1.7cm width x 0.1cm depth; 2.403cm^2 area and 0.24cm^3 volume. The wound is limited to skin breakdown. There is no tunneling or undermining noted. There is a medium amount of serous drainage noted. The wound margin is flat and intact. There is no granulation within the wound bed. There is a large (67-100%) amount of necrotic tissue within the wound bed including Eschar. Assessment [name], [name] ([id number]) Having reviewed her left lower extremity and finding no acute arterial or venous insufficiency I have recommended symptomatic treatment. For the wound I have recommended hydrogel, bordered foam and I have urged her to use her Juzo compression stockings from morning till night on a regular basis till she sees Dr. Dellia Nims back next week. I do not believe she needs any urgent studies at the present but if the problem persists over the long weekend I have urged her to go along with the ER for a further vascular workup Plan Wound Cleansing: Wound #9 Left,Posterior Lower Leg: Clean wound with Normal Saline. Anesthetic: Wound #9 Left,Posterior Lower Leg: Topical Lidocaine 4% cream applied to wound bed prior to debridement Primary Wound Dressing: Wound #9 Left,Posterior Lower Leg: Hydrogel Secondary Dressing: Boardered Foam Dressing Dressing Change Frequency: Wound #9 Left,Posterior Lower Leg: Change dressing every other day. Follow-up Appointments: Wound #8 Right,Medial Lower Leg: Return Appointment in 1 week. Wound #9 Left,Posterior Lower Leg: Return Appointment in 1 week. Edema Control: Patient to wear own Juxtalite/Juzo compression garment. - Left leg Having reviewed her left lower extremity and finding no acute arterial or venous insufficiency I have recommended symptomatic treatment. For the wound I have recommended hydrogel, bordered foam and I have urged her to use her Juzo compression stockings from morning till night on a regular basis till  she sees Dr. Dellia Nims back next week. I do not believe she needs any urgent studies at the present but if the problem persists over the long weekend I have urged her to go along with the ER for a further vascular workup [name], [name] ([id number]) Electronic Signature(s) Signed: 06/28/2016 4:31:28 PM By: Christin Fudge MD, FACS Previous Signature: 06/28/2016 12:06:40 PM Version By: Christin Fudge MD, FACS Entered By: Christin Fudge on 06/28/2016 16:31:28 [name], [name] ([id number]) -------------------------------------------------------------------------------- SuperBill Details Patient Name: Carrie Mulligan C. Date of Service: 06/28/2016 Medical Record Number: [id number] Patient Account Number: [id number] Date of Birth/Sex: 05-Mar-1935 (81 y.o. Female) Treating RN: [name] Primary Care Physician: [name] Other Clinician: Referring Physician: [name] Treating Physician/Extender: Frann Rider in Treatment: 31 Diagnosis Coding ICD-10 Codes Code Description [id number] Non-pressure chronic ulcer of other part of right lower leg with fat layer exposed Chronic venous hypertension (idiopathic) with ulcer and inflammation of bilateral lower I87.333 extremity I89.0 Lymphedema, not elsewhere classified Y56.38 Chronic systolic (congestive) heart failure Facility Procedures CPT4 Code: [id number] Description: 68115 - WOUND CARE VISIT-LEV 2 EST PT Modifier: Quantity: 1 Physician Procedures CPT4: Description Modifier Quantity Code [id number] 99213 - WC PHYS LEVEL 3 - EST PT 1 ICD-10 Description Diagnosis L97.812 Non-pressure chronic ulcer of other part of right lower leg with fat layer exposed I87.333 Chronic venous hypertension (idiopathic)  with ulcer and inflammation of bilateral lower extremity I89.0 Lymphedema, not elsewhere classified D97.41 Chronic systolic (congestive) heart failure Electronic  Signature(s) Signed: 06/28/2016 4:31:44 PM By: Christin Fudge MD,  FACS Previous Signature: 06/28/2016 4:27:48 PM Version By: Christin Fudge MD, FACS Previous Signature: 06/28/2016 12:06:53 PM Version By: Christin Fudge MD, FACS Entered By: Christin Fudge on 06/28/2016 16:31:44

## 2016-06-28 NOTE — Progress Notes (Signed)
Location:      Place of Service:  SNF (31) Provider:  Toni Arthurs, NP-C  Rusty Aus, MD  Patient Care Team: Rusty Aus, MD as PCP - General (Internal Medicine) Robert Bellow, MD as Consulting Physician (General Surgery)  Extended Emergency Contact Information Primary Emergency Contact: [name],[name] Address: [address]          [place], [place] [address] [name] of [place] Phone: [phone] Relation: Daughter Secondary Emergency Contact: [name] [place] of [place] Mobile Phone: [phone] Relation: Son  Code Status:  DO NOT RESUSCITATE Goals of care: Advanced Directive information Advanced Directives 06/13/2016  Does Patient Have a Medical Advance Directive? No  Would patient like information on creating a medical advance directive? No - Patient declined     Chief Complaint  Patient presents with  . Follow-up  . Medication Management    HPI:  Pt is a 81 y.o. female seen today for a Routine post-op visit, for ongoing management of intractable pain and Coumadin dosing. Patient underwent kyphoplasty last week. She reports pain is somewhat better, but "is still there." Patient is on chronic Coumadin for DVT prophylaxis for mechanical heart valve and A. Fib. Today, INR is 1.7.  She was taken off Coumadin in preparation for the kyphoplasty. Since the surgery has been completed, we'll restart coumadin at the dose that was therapeutic prior to d/c. We will also bridge with daily doses of Lovenox until Coumadin is therapeutic. Pt verbalizes understanding of the plan of care.  Vital signs stable. No other complaints.   Past Medical History:  Diagnosis Date  . Arthritis    "back, hands, knees" (04/10/2016)  . Chronic atrial fibrillation ([place])    a. On Coumadin  . Chronic back pain   . Chronic combined systolic (congestive) and diastolic (congestive) heart failure    a. EF 25% by echo in 08/2015 b. RHC in 08/2015 showed normal filling  pressures  . Compression fracture    "several; all in my back" (04/10/2016)  . GERD (gastroesophageal reflux disease)   . Heart murmur   . Hypertension   . S/P MVR (mitral valve replacement)    a. MVR 1994 b. redo MVR in 08/2014 - on Coumadin  . Shortness of breath dyspnea    Past Surgical History:  Procedure Laterality Date  . BREAST LUMPECTOMY Right 2005?   benign lump excision  . CARDIAC CATHETERIZATION    . [place], 2016   "MVR; MVR"  . CATARACT EXTRACTION W/PHACO Right 02/20/2015   Procedure: CATARACT EXTRACTION PHACO AND INTRAOCULAR LENS PLACEMENT (IOC);  Surgeon: Estill Cotta, MD;  Location: [facility] ORS;  Service: Ophthalmology;  Laterality: Right;  US01:31.2 AP 25.3% CDE40.13 Fluid lot # [id number]  . ELECTROPHYSIOLOGIC STUDY N/A 10/09/2015   Procedure: CARDIOVERSION;  Surgeon: Wellington Hampshire, MD;  Location: [facility] OR[facility];  Service: Cardiovascular;  Laterality: N/A;  . IR GENERIC HISTORICAL  04/01/2016   IR RADIOLOGIST EVAL & MGMT 04/01/2016 [facility]  . IR GENERIC HISTORICAL  04/15/2016   IR VERTEBROPLASTY CERV/THOR BX INC UNI/BIL INC/INJECT/IMAGING 04/15/2016 Luanne Bras, MD [facility]  . IR GENERIC HISTORICAL  04/15/2016   IR VERTEBROPLASTY EA ADDL (T&LS) BX INC UNI/BIL INC INJECT/IMAGING 04/15/2016 Luanne Bras, MD [facility]  . IR GENERIC HISTORICAL  04/15/2016   IR VERTEBROPLASTY EA ADDL (T&LS) BX INC UNI/BIL INC INJECT/IMAGING 04/15/2016 Luanne Bras, MD [facility]  . IR GENERIC HISTORICAL  05/20/2016   IR RADIOLOGIST EVAL & MGMT  05/20/2016 [facility]  . IR GENERIC HISTORICAL  06/13/2016   IR VERTEBROPLASTY EA ADDL (T&LS) BX INC UNI/BIL INC INJECT/IMAGING 06/13/2016 Luanne Bras, MD [facility]  . IR GENERIC HISTORICAL  06/13/2016   IR SACROPLASTY BILATERAL 06/13/2016 Luanne Bras, MD [facility]  . IR GENERIC HISTORICAL  06/13/2016   IR VERTEBROPLASTY LUMBAR BX INC UNI/BIL INC/INJECT/IMAGING  06/13/2016 Luanne Bras, MD [facility]  . IRRIGATION AND DEBRIDEMENT HEMATOMA Left 07/05/2015   Procedure: IRRIGATION AND DEBRIDEMENT HEMATOMA;  Surgeon: Robert Bellow, MD;  Location: [facility];  Service: General;  Laterality: Left;  . pyloric stenosis  07/1937  . US ECHOCARDIOGRAPHY      Allergies  Allergen Reactions  . Ace Inhibitors     Other reaction(s): Cough  . Hydrocodone Itching and Nausea Only  . Mercury     Other reaction(s): Unknown  . Nickel   . Silver Dermatitis    Severe itching  . Cephalexin Rash  . Clindamycin/Lincomycin Rash  . Penicillins Rash    Has patient had a PCN reaction causing immediate rash, facial/tongue/throat swelling, SOB or lightheadedness with hypotension: YES Has patient had a PCN reaction causing severe rash involving mucus membranes or skin necrosis: NO Has patient had a PCN reaction that required hospitalization NO Has patient had a PCN reaction occurring within the last 10 years: NO If all of the above answers are "NO", then may proceed with Cephalosporin use.    Allergies as of 06/21/2016      Reactions   Ace Inhibitors    Other reaction(s): Cough   Hydrocodone Itching, Nausea Only   Mercury    Other reaction(s): Unknown   Nickel    Silver Dermatitis   Severe itching   Cephalexin Rash   Clindamycin/lincomycin Rash   Penicillins Rash   Has patient had a PCN reaction causing immediate rash, facial/tongue/throat swelling, SOB or lightheadedness with hypotension: YES Has patient had a PCN reaction causing severe rash involving mucus membranes or skin necrosis: NO Has patient had a PCN reaction that required hospitalization NO Has patient had a PCN reaction occurring within the last 10 years: NO If all of the above answers are "NO", then may proceed with Cephalosporin use.      Medication List       Accurate as of 06/21/16 11:59 PM. Always use your most recent med list.          acetaminophen 500 MG tablet Commonly  known as:  TYLENOL Take 500 mg by mouth every 6 (six) hours as needed for mild pain, moderate pain, fever or headache.   acetaminophen 325 MG tablet Commonly known as:  TYLENOL Take 650 mg by mouth 4 (four) times daily.   aspirin EC 81 MG tablet Take 81 mg by mouth daily.   calcium carbonate 500 MG chewable tablet Commonly known as:  TUMS - dosed in mg elemental calcium Chew 1 tablet by mouth every 4 (four) hours as needed for indigestion or heartburn.   diclofenac 1.3 % Ptch Commonly known as:  FLECTOR Place 1 patch onto the skin 2 (two) times daily. To the greatest pain in lower back   enoxaparin 40 MG/0.4ML injection Commonly known as:  LOVENOX Inject 40 mg into the skin daily.   feeding supplement (ENSURE ENLIVE) Liqd Take 237 mLs by mouth 2 (two) times daily between meals.   furosemide 40 MG tablet Commonly known as:  LASIX Take 40 mg by mouth 2 (two) times daily.   HM VITAMIN D3 4000  units Caps Generic drug:  Cholecalciferol Take 1 capsule by mouth daily.   KLOR-CON 10 10 MEQ tablet Generic drug:  potassium chloride Take 2 capsules by mouth daily.   liver oil-zinc oxide 40 % ointment Commonly known as:  DESITIN Apply 1 application topically as needed (bed sores).   methocarbamol 500 MG tablet Commonly known as:  ROBAXIN Take 250 mg by mouth 4 (four) times daily.   metoprolol succinate 25 MG 24 hr tablet Commonly known as:  TOPROL-XL Take 1 tablet (25 mg total) by mouth daily.   mirtazapine 15 MG tablet Commonly known as:  REMERON Take 15 mg by mouth at bedtime.   oxyCODONE 5 MG immediate release tablet Commonly known as:  Oxy IR/ROXICODONE Take 5 mg by mouth every 4 (four) hours as needed for moderate pain, severe pain or breakthrough pain.   oxyCODONE 5 MG immediate release tablet Commonly known as:  Oxy IR/ROXICODONE Take 1 tablet (5 mg total) by mouth every 4 (four) hours.   pantoprazole 40 MG tablet Commonly known as:  PROTONIX Take 40 mg by  mouth daily.   polyethylene glycol packet Commonly known as:  MIRALAX / GLYCOLAX Take 17 g by mouth 2 (two) times daily.   RISA-BID PROBIOTIC PO Take 1 capsule by mouth 2 (two) times daily.   senna-docusate 8.6-50 MG tablet Commonly known as:  Senokot-S Take 1 tablet by mouth 2 (two) times daily.   spironolactone 25 MG tablet Commonly known as:  ALDACTONE Take 25 mg by mouth 2 (two) times daily.   traZODone 50 MG tablet Commonly known as:  DESYREL Take 50 mg by mouth at bedtime as needed for sleep.   warfarin 5 MG tablet Commonly known as:  COUMADIN Take 1 tablet (5 mg total) by mouth daily.       Review of Systems  Constitutional: Negative for activity change, appetite change, chills, diaphoresis and fever.  Respiratory: Negative for apnea, cough, choking, chest tightness, shortness of breath and wheezing.   Cardiovascular: Negative for chest pain, palpitations and leg swelling.  Gastrointestinal: Negative for abdominal distention, abdominal pain, anal bleeding, blood in stool, constipation, diarrhea and nausea.  Genitourinary: Negative for difficulty urinating, dysuria, frequency, hematuria, urgency and vaginal bleeding.  Musculoskeletal: Positive for arthralgias (typical arthritis), back pain and myalgias. Negative for gait problem.  Skin: Positive for wound. Negative for color change, pallor and rash.  Neurological: Negative for dizziness, tremors, syncope, speech difficulty, weakness, numbness and headaches.  All other systems reviewed and are negative.    There is no immunization history on file for this patient. Pertinent  Health Maintenance Due  Topic Date Due  . DEXA SCAN  06/28/2002  . PNA vac Low Risk Adult (1 of 2 - PCV13) 06/28/2002  . INFLUENZA VACCINE  01/30/2016   Fall Risk  09/25/2015  Falls in the past year? No   Functional Status Survey:    Vitals:   06/21/16 0600  BP: (!) 142/63  Pulse: 83  Resp: 17  Temp: 97.9 F (36.6 C)  SpO2: 93%    Weight: 146 lb 12.8 oz (66.6 kg)   Body mass index is 22 kg/m. Physical Exam  Constitutional: She is oriented to person, place, and time. Vital signs are normal. She appears well-developed and well-nourished. She is active and cooperative. She does not appear ill. No distress.  HENT:  Head: Normocephalic and atraumatic.  Mouth/Throat: Uvula is midline, oropharynx is clear and moist and mucous membranes are normal. Mucous membranes are not pale,  not dry and not cyanotic.  Eyes: Conjunctivae, EOM and lids are normal. Pupils are equal, round, and reactive to light.  Neck: Trachea normal, normal range of motion and full passive range of motion without pain. Neck supple. No JVD present. No tracheal deviation, no edema and no erythema present. No thyromegaly present.  Cardiovascular: Normal rate, regular rhythm, normal heart sounds, intact distal pulses and normal pulses.  Exam reveals no gallop, no distant heart sounds and no friction rub.   No murmur heard. Pulmonary/Chest: Effort normal and breath sounds normal. No accessory muscle usage. No respiratory distress. She has no wheezes. She has no rales. She exhibits no tenderness.  Abdominal: Normal appearance and bowel sounds are normal. She exhibits no distension and no ascites. There is no tenderness.  Musculoskeletal: She exhibits no edema.       Lumbar back: She exhibits decreased range of motion (multiple compression fractures), tenderness, bony tenderness and pain.  Expected osteoarthritis, stiffness  Neurological: She is alert and oriented to person, place, and time. She has normal strength.  Skin: Skin is warm, dry and intact. Lesion (small, 2 cm diameter vascular wound with eschar on the posterior right lower leg with small 1 cm diameter satellite lesion proximal. No drainage or erythema) noted. No rash noted. She is not diaphoretic. No cyanosis or erythema. No pallor. Nails show no clubbing.  Psychiatric: She has a normal mood and affect.  Her speech is normal and behavior is normal. Judgment and thought content normal. Cognition and memory are normal.  Nursing note and vitals reviewed.   Labs reviewed:  Recent Labs  06/23/16 0700 06/25/16 0615 06/27/16 0700  NA 137 136 136  K 5.1 4.4 4.7  CL 102 101 101  CO2 _0 GLUCOSE 86 89 82  BUN 29* 31* 31*  CREATININE 1.40* 1.47* 1.51*  CALCIUM 10.0 10.3 10.2    Recent Labs  05/20/16 0435 05/21/16 1625 06/01/16 0500  AST _1 ALT _2 ALKPHOS 122 151* 129*  BILITOT 0.9 0.6 1.0  PROT 7.0 7.5 6.7  ALBUMIN 3.9 4.3 3.5    Recent Labs  06/23/16 0700 06/25/16 0615 06/27/16 0700  WBC 3.0* 4.0 3.3*  NEUTROABS 1.6 1.8 1.7  HGB 10.9* 11.6* 11.2*  HCT 33.1* 35.2 34.2*  MCV 84.5 83.1 83.1  PLT 173 193 205   Lab Results  Component Value Date   TSH 2.239 04/13/2016   No results found for: HGBA1C No results found for: CHOL, HDL, LDLCALC, LDLDIRECT, TRIG, CHOLHDL  Significant Diagnostic Results in last 30 days:  Ir Vertebroplasty Lumbar Bx Inc Uni/bil Inc Inject/imaging  Result Date: 06/14/2016 INDICATION: Severe lumbosacral and sacral pain due to compression fractures at L2, L4 and sacral insufficiency fractures. EXAM: IR VERTEBROPLASTY LUMBOSACRAL INJ; IR VERTEBROPLASTY ADDL INJECTION; IR SACRALPLASTY INJ BILAT<Procedure Description>IR VERTEBROPLASTY LUMBOSACRAL INJ; IR VERTEBROPLASTY ADDL INJECTION; IR SACRALPLASTY INJ BILAT MEDICATIONS: As antibiotic prophylaxis, vancomycin 1 g IV was ordered pre-procedure and administered intravenously within 1 hour of incision. ANESTHESIA/SEDATION: Moderate (conscious) sedation was employed during this procedure. A total of Versed 1.5 mg and Fentanyl 100 mcg was administered intravenously. Moderate Sedation Time: 45 minutes. The patient's level of consciousness and vital signs were monitored continuously by radiology nursing throughout the procedure under my direct supervision. FLUOROSCOPY TIME:  Fluoroscopy Time: 8  minutes 42 seconds (7026 mGy) COMPLICATIONS: None immediate. TECHNIQUE: Informed written consent was obtained from the patient after a thorough discussion of the procedural risks, benefits and  alternatives. All questions were addressed. Maximal Sterile Barrier Technique was utilized including caps, mask, sterile gowns, sterile gloves, sterile drape, hand hygiene and skin antiseptic. A timeout was performed prior to the initiation of the procedure. Prior to the procedure, extensive discussions are were held with the patient and the patient's daughter in regards to the levels being treated. Questions were answered to their satisfaction. The plan was to do vertebral body augmentations at L2, L4 and S1. PROCEDURE: The patient was placed prone on the fluoroscopic table. Nasal oxygen was administered. Physiologic monitoring was performed throughout the duration of the procedure. The skin overlying the lumbosacral region was prepped and draped in the usual sterile fashion. The L2 and L4 vertebral bodies were identified in the right pedicle. Both L2 and L4 were infiltrated with 0.25% Bupivacaine. This was then followed by the advancement of a 13-gauge Cook needle through the right pedicle at L2 and L4 pedicle into the anterior one-third at both levels. A gentle contrast injection demonstrated a trabecular pattern of contrast, with early opacification of an anterior paraspinous vein at the level of L4. The skin entry sites for the sacroplasty were also infiltrated with 0.25% bupivacaine followed by the advancement of 13 gauge Cook spinal needles into the anterior one-third at L1 bilaterally. A gentle contrast injection demonstrated early presacral veins which necessitated the use of Gel-Foam pledgets through the 13 gauge Cook spinal needles prior to the delivery of the methylmethacrylate mixture. At this time, methylmethacrylate mixture was reconstituted. Under biplane intermittent fluoroscopy, the methylmethacrylate was  then injected into the L2 and L4 vertebral bodies with filling of the vertebral bodies. Also the methylmethacrylate mixture was injected at S1 under biplane intermittent fluoroscopy. Excellent filling was obtained at the levels treated. No extravasation was noted into the disk spaces or posteriorly into the spinal canal. No epidural venous contamination was seen. The needles were then removed. Hemostasis was achieved at the skin entry sites. There were no acute complications. The patient tolerated the procedure well. The patient was observed for 3 hours and discharged in good condition. IMPRESSION: 1. Status post vertebral body augmentation for painful compression fracture at L2, L4 and S1 using vertebroplasty technique. Following the procedure, the patient did complain of low back pain. She and her daughter were informed to continue with her previous pain medications as prescribed for the pain as needed. Instructions were provided to the daughter regarding post procedural care. Questions were answered to their satisfaction. They were asked to call should they have any concerns or questions. Electronically Signed   By: Luanne Bras M.D.   On: 06/13/2016 15:26   Ir Vertebroplasty Ea Addl (t&ls) Bx Inc Uni/bil Inc Inject/imaging  Result Date: 06/14/2016 INDICATION: Severe lumbosacral and sacral pain due to compression fractures at L2, L4 and sacral insufficiency fractures. EXAM: IR VERTEBROPLASTY LUMBOSACRAL INJ; IR VERTEBROPLASTY ADDL INJECTION; IR SACRALPLASTY INJ BILAT<Procedure Description>IR VERTEBROPLASTY LUMBOSACRAL INJ; IR VERTEBROPLASTY ADDL INJECTION; IR SACRALPLASTY INJ BILAT MEDICATIONS: As antibiotic prophylaxis, vancomycin 1 g IV was ordered pre-procedure and administered intravenously within 1 hour of incision. ANESTHESIA/SEDATION: Moderate (conscious) sedation was employed during this procedure. A total of Versed 1.5 mg and Fentanyl 100 mcg was administered intravenously. Moderate Sedation  Time: 45 minutes. The patient's level of consciousness and vital signs were monitored continuously by radiology nursing throughout the procedure under my direct supervision. FLUOROSCOPY TIME:  Fluoroscopy Time: 8 minutes 42 seconds (5400 mGy) COMPLICATIONS: None immediate. TECHNIQUE: Informed written consent was obtained from the patient after a thorough discussion  of the procedural risks, benefits and alternatives. All questions were addressed. Maximal Sterile Barrier Technique was utilized including caps, mask, sterile gowns, sterile gloves, sterile drape, hand hygiene and skin antiseptic. A timeout was performed prior to the initiation of the procedure. Prior to the procedure, extensive discussions are were held with the patient and the patient's daughter in regards to the levels being treated. Questions were answered to their satisfaction. The plan was to do vertebral body augmentations at L2, L4 and S1. PROCEDURE: The patient was placed prone on the fluoroscopic table. Nasal oxygen was administered. Physiologic monitoring was performed throughout the duration of the procedure. The skin overlying the lumbosacral region was prepped and draped in the usual sterile fashion. The L2 and L4 vertebral bodies were identified in the right pedicle. Both L2 and L4 were infiltrated with 0.25% Bupivacaine. This was then followed by the advancement of a 13-gauge Cook needle through the right pedicle at L2 and L4 pedicle into the anterior one-third at both levels. A gentle contrast injection demonstrated a trabecular pattern of contrast, with early opacification of an anterior paraspinous vein at the level of L4. The skin entry sites for the sacroplasty were also infiltrated with 0.25% bupivacaine followed by the advancement of 13 gauge Cook spinal needles into the anterior one-third at L1 bilaterally. A gentle contrast injection demonstrated early presacral veins which necessitated the use of Gel-Foam pledgets through the 13  gauge Cook spinal needles prior to the delivery of the methylmethacrylate mixture. At this time, methylmethacrylate mixture was reconstituted. Under biplane intermittent fluoroscopy, the methylmethacrylate was then injected into the L2 and L4 vertebral bodies with filling of the vertebral bodies. Also the methylmethacrylate mixture was injected at S1 under biplane intermittent fluoroscopy. Excellent filling was obtained at the levels treated. No extravasation was noted into the disk spaces or posteriorly into the spinal canal. No epidural venous contamination was seen. The needles were then removed. Hemostasis was achieved at the skin entry sites. There were no acute complications. The patient tolerated the procedure well. The patient was observed for 3 hours and discharged in good condition. IMPRESSION: 1. Status post vertebral body augmentation for painful compression fracture at L2, L4 and S1 using vertebroplasty technique. Following the procedure, the patient did complain of low back pain. She and her daughter were informed to continue with her previous pain medications as prescribed for the pain as needed. Instructions were provided to the daughter regarding post procedural care. Questions were answered to their satisfaction. They were asked to call should they have any concerns or questions. Electronically Signed   By: Luanne Bras M.D.   On: 06/13/2016 15:26   Ir Sacroplasty Bilateral  Result Date: 06/14/2016 INDICATION: Severe lumbosacral and sacral pain due to compression fractures at L2, L4 and sacral insufficiency fractures. EXAM: IR VERTEBROPLASTY LUMBOSACRAL INJ; IR VERTEBROPLASTY ADDL INJECTION; IR SACRALPLASTY INJ BILAT<Procedure Description>IR VERTEBROPLASTY LUMBOSACRAL INJ; IR VERTEBROPLASTY ADDL INJECTION; IR SACRALPLASTY INJ BILAT MEDICATIONS: As antibiotic prophylaxis, vancomycin 1 g IV was ordered pre-procedure and administered intravenously within 1 hour of incision.  ANESTHESIA/SEDATION: Moderate (conscious) sedation was employed during this procedure. A total of Versed 1.5 mg and Fentanyl 100 mcg was administered intravenously. Moderate Sedation Time: 45 minutes. The patient's level of consciousness and vital signs were monitored continuously by radiology nursing throughout the procedure under my direct supervision. FLUOROSCOPY TIME:  Fluoroscopy Time: 8 minutes 42 seconds (1093 mGy) COMPLICATIONS: None immediate. TECHNIQUE: Informed written consent was obtained from the patient after a thorough discussion of  the procedural risks, benefits and alternatives. All questions were addressed. Maximal Sterile Barrier Technique was utilized including caps, mask, sterile gowns, sterile gloves, sterile drape, hand hygiene and skin antiseptic. A timeout was performed prior to the initiation of the procedure. Prior to the procedure, extensive discussions are were held with the patient and the patient's daughter in regards to the levels being treated. Questions were answered to their satisfaction. The plan was to do vertebral body augmentations at L2, L4 and S1. PROCEDURE: The patient was placed prone on the fluoroscopic table. Nasal oxygen was administered. Physiologic monitoring was performed throughout the duration of the procedure. The skin overlying the lumbosacral region was prepped and draped in the usual sterile fashion. The L2 and L4 vertebral bodies were identified in the right pedicle. Both L2 and L4 were infiltrated with 0.25% Bupivacaine. This was then followed by the advancement of a 13-gauge Cook needle through the right pedicle at L2 and L4 pedicle into the anterior one-third at both levels. A gentle contrast injection demonstrated a trabecular pattern of contrast, with early opacification of an anterior paraspinous vein at the level of L4. The skin entry sites for the sacroplasty were also infiltrated with 0.25% bupivacaine followed by the advancement of 13 gauge Cook  spinal needles into the anterior one-third at L1 bilaterally. A gentle contrast injection demonstrated early presacral veins which necessitated the use of Gel-Foam pledgets through the 13 gauge Cook spinal needles prior to the delivery of the methylmethacrylate mixture. At this time, methylmethacrylate mixture was reconstituted. Under biplane intermittent fluoroscopy, the methylmethacrylate was then injected into the L2 and L4 vertebral bodies with filling of the vertebral bodies. Also the methylmethacrylate mixture was injected at S1 under biplane intermittent fluoroscopy. Excellent filling was obtained at the levels treated. No extravasation was noted into the disk spaces or posteriorly into the spinal canal. No epidural venous contamination was seen. The needles were then removed. Hemostasis was achieved at the skin entry sites. There were no acute complications. The patient tolerated the procedure well. The patient was observed for 3 hours and discharged in good condition. IMPRESSION: 1. Status post vertebral body augmentation for painful compression fracture at L2, L4 and S1 using vertebroplasty technique. Following the procedure, the patient did complain of low back pain. She and her daughter were informed to continue with her previous pain medications as prescribed for the pain as needed. Instructions were provided to the daughter regarding post procedural care. Questions were answered to their satisfaction. They were asked to call should they have any concerns or questions. Electronically Signed   By: Luanne Bras M.D.   On: 06/13/2016 15:26    Assessment/Plan 1. Pain, acute pot-operative  Continue Oxycodone 10 mg po QID scheduled  Continue Oxycodone 5 mg po Q 4 hours prn  Methocarbamol 500 mg one half tablet by mouth 4 times a day scheduled  Continue Flector patch   Continue Tylenol 650 mg by mouth 4 times a day scheduled  2. Encounter for therapeutic drug monitoring  Continue  Coumadin at 4.5 mg po daily  Begin Lovenox 40 mg subcutaneous daily for DVT prophylaxis   Continue Lovenox regimen until Coumadin is at a therapeutic level  Monitor PT/INR   Family/ staff Communication:   Total Time:  Documentation:  Face to Face:  Family/Phone:   Labs/tests ordered:  Cbc, met B, PT/INR EOD  Medication list reviewed and assessed for continued appropriateness.  Vikki Ports, NP-C Geriatrics [facility] [phone]  [address], [place] [address] Cell Phone (Mon-Fri 8am-5pm):  [phone] On Call:  [phone] & follow prompts after 5pm & weekends Office Phone:  [phone] Office Fax:  [phone]

## 2016-06-29 LAB — CBC WITH DIFFERENTIAL/PLATELET
BASOS PCT: 2 %
Basophils Absolute: 0.1 10*3/uL (ref 0–0.1)
EOS ABS: 0.3 10*3/uL (ref 0–0.7)
Eosinophils Relative: 8 %
HEMATOCRIT: 34.5 % — AB (ref 35.0–47.0)
Hemoglobin: 11.4 g/dL — ABNORMAL LOW (ref 12.0–16.0)
Lymphocytes Relative: 28 %
Lymphs Abs: 0.9 10*3/uL — ABNORMAL LOW (ref 1.0–3.6)
MCH: 27.4 pg (ref 26.0–34.0)
MCHC: 33 g/dL (ref 32.0–36.0)
MCV: 83 fL (ref 80.0–100.0)
MONO ABS: 0.6 10*3/uL (ref 0.2–0.9)
MONOS PCT: 18 %
Neutro Abs: 1.4 10*3/uL (ref 1.4–6.5)
Neutrophils Relative %: 44 %
Platelets: 211 10*3/uL (ref 150–440)
RBC: 4.16 MIL/uL (ref 3.80–5.20)
RDW: 17.1 % — AB (ref 11.5–14.5)
WBC: 3.2 10*3/uL — ABNORMAL LOW (ref 3.6–11.0)

## 2016-06-29 LAB — PROTIME-INR
INR: 2.09
Prothrombin Time: 23.8 seconds — ABNORMAL HIGH (ref 11.4–15.2)

## 2016-06-29 LAB — BASIC METABOLIC PANEL
Anion gap: 8 (ref 5–15)
BUN: 33 mg/dL — AB (ref 6–20)
CALCIUM: 10.5 mg/dL — AB (ref 8.9–10.3)
CO2: 28 mmol/L (ref 22–32)
CREATININE: 1.39 mg/dL — AB (ref 0.44–1.00)
Chloride: 102 mmol/L (ref 101–111)
GFR calc non Af Amer: 35 mL/min — ABNORMAL LOW (ref >60)
GFR, EST AFRICAN AMERICAN: 41 mL/min — AB (ref >60)
Glucose, Bld: 91 mg/dL (ref 65–99)
Potassium: 5 mmol/L (ref 3.5–5.1)
SODIUM: 138 mmol/L (ref 135–145)

## 2016-06-29 NOTE — Progress Notes (Signed)
MANDEE, PLUTA ([id number]) Visit Report for 06/28/2016 Arrival Information Details Patient Name: [name], [name] 06/28/2016 11:30 Date of Service: AM Medical Record [id number] Number: Patient Account Number: [id number] Jul 05, 1934 (81 y.o. Treating RN: Cornell Barman Date of Birth/Sex: Female) Other Clinician: Primary Care Physician: [name] Treating Christin Fudge Referring Physician: [name] Physician/Extender: Suella Grove in Treatment: 3 Visit Information History Since Last Visit Added or deleted any medications: No Patient Arrived: Wheel Chair Any new allergies or adverse reactions: No Arrival Time: 11:29 Had a fall or experienced change in No Accompanied By: daughter, [name] activities of daily living that may affect Transfer Assistance: Manual risk of falls: Patient Identification Verified: Yes Signs or symptoms of abuse/neglect since last No Secondary Verification Process Yes visito Completed: Has Dressing in Place as Prescribed: No Patient Requires Transmission- No Has Compression in Place as Prescribed: No Based Precautions: Pain Present Now: Yes Patient Has Alerts: Yes Patient Alerts: Patient on Blood Thinner [place]!!! Notes Patient arrives today complaining of pain in her right leg going from wound to the lateral aspect of the foot. Electronic Signature(s) Signed: 06/28/2016 5:31:46 PM By: Gretta Cool, RN, BSN, Kim RN, BSN Entered By: Gretta Cool, RN, BSN, Kim on 06/28/2016 12:11:23 [name] ([id number]) -------------------------------------------------------------------------------- Clinic Level of Care Assessment Details Patient Name: [name] 06/28/2016 11:30 Date of Service: AM Medical Record [id number] Number: Patient Account Number: [id number] 1934-11-30 (81 y.o. Treating RN: Cornell Barman Date of Birth/Sex: Female) Other Clinician: Primary Care Physician: [name] Treating Christin Fudge Referring Physician: [name] Physician/Extender: Suella Grove in Treatment: 31 Clinic Level of Care Assessment Items TOOL 4 Quantity Score []  - Use when only an EandM is performed on FOLLOW-UP visit 0 ASSESSMENTS - Nursing Assessment / Reassessment []  - Reassessment of Co-morbidities (includes updates in patient status) 0 X - Reassessment of Adherence to Treatment Plan 1 5 ASSESSMENTS - Wound and Skin Assessment / Reassessment X - Simple Wound Assessment / Reassessment - one wound 1 5 []  - Complex Wound Assessment / Reassessment - multiple wounds 0 []  - Dermatologic / Skin Assessment (not related to wound area) 0 ASSESSMENTS - Focused Assessment []  - Circumferential Edema Measurements - multi extremities 0 []  - Nutritional Assessment / Counseling / Intervention 0 []  - Lower Extremity Assessment (monofilament, tuning fork, pulses) 0 []  - Peripheral Arterial Disease Assessment (using hand held doppler) 0 ASSESSMENTS - Ostomy and/or Continence Assessment and Care []  - Incontinence Assessment and Management 0 []  - Ostomy Care Assessment and Management (repouching, etc.) 0 PROCESS - Coordination of Care X - Simple Patient / Family Education for ongoing care 1 15 []  - Complex (extensive) Patient / Family Education for ongoing care 0 []  - Staff obtains Programmer, systems, Records, Test Results / Process Orders 0 X - Staff telephones HHA, Nursing Homes / Clarify orders / etc 1 10 Argo, Aury C. ([id number]) []  - Routine Transfer to another Facility (non-emergent condition) 0 []  - Routine Hospital Admission (non-emergent condition) 0 []  - New Admissions / Biomedical engineer / Ordering NPWT, Apligraf, etc. 0 []  - Emergency Hospital Admission (emergent condition) 0 X - Simple Discharge Coordination 1 10 []  - Complex (extensive) Discharge Coordination 0 PROCESS - Special Needs []  - Pediatric / Minor Patient Management 0 []  - Isolation Patient Management 0 []  - Hearing / Language /  Visual special needs 0 []  - Assessment of Community assistance (transportation, D/C planning, etc.) 0 []  - Additional assistance / Altered mentation 0 []  - Support Surface(s) Assessment (bed, cushion, seat,  etc.) 0 INTERVENTIONS - Wound Cleansing / Measurement X - Simple Wound Cleansing - one wound 1 5 []  - Complex Wound Cleansing - multiple wounds 0 X - Wound Imaging (photographs - any number of wounds) 1 5 []  - Wound Tracing (instead of photographs) 0 X - Simple Wound Measurement - one wound 1 5 []  - Complex Wound Measurement - multiple wounds 0 INTERVENTIONS - Wound Dressings X - Small Wound Dressing one or multiple wounds 1 10 []  - Medium Wound Dressing one or multiple wounds 0 []  - Large Wound Dressing one or multiple wounds 0 []  - Application of Medications - topical 0 []  - Application of Medications - injection 0 Meggett, Evelyna C. ([id number]) INTERVENTIONS - Miscellaneous []  - External ear exam 0 []  - Specimen Collection (cultures, biopsies, blood, body fluids, etc.) 0 []  - Specimen(s) / Culture(s) sent or taken to Lab for analysis 0 []  - Patient Transfer (multiple staff / Civil Service fast streamer / Similar devices) 0 []  - Simple Staple / Suture removal (25 or less) 0 []  - Complex Staple / Suture removal (26 or more) 0 []  - Hypo / Hyperglycemic Management (close monitor of Blood Glucose) 0 []  - Ankle / Brachial Index (ABI) - do not check if billed separately 0 X - Vital Signs 1 5 Has the patient been seen at the hospital within the last three years: Yes Total Score: 75 Level Of Care: New/Established - Level 2 Electronic Signature(s) Signed: 06/28/2016 5:31:46 PM By: Gretta Cool, RN, BSN, Kim RN, BSN Entered By: Gretta Cool, RN, BSN, Kim on 06/28/2016 12:07:24 [name] ([id number]) -------------------------------------------------------------------------------- Encounter Discharge Information Details Patient Name: [name] 06/28/2016 11:30 Date of Service: AM Medical  Record [id number] Number: Patient Account Number: [id number] 04/08/1935 (81 y.o. Treating RN: Cornell Barman Date of Birth/Sex: Female) Other Clinician: Primary Care Physician: [name] Treating Christin Fudge Referring Physician: [name] Physician/Extender: Suella Grove in Treatment: 31 Encounter Discharge Information Items Discharge Pain Level: 3 Discharge Condition: Stable Ambulatory Status: Wheelchair Nursing Discharge Destination: Home Transportation: Private Auto Accompanied By: daughter Schedule Follow-up Appointment: Yes Medication Reconciliation completed Yes and provided to Patient/Care Delle Andrzejewski: Patient Clinical Summary of Care: Declined Electronic Signature(s) Signed: 06/28/2016 5:31:46 PM By: Gretta Cool RN, BSN, Kim RN, BSN Previous Signature: 06/28/2016 11:58:58 AM Version By: Ruthine Dose Entered By: Gretta Cool RN, BSN, Kim on 06/28/2016 12:08:50 [name] ([id number]) -------------------------------------------------------------------------------- Lower Extremity Assessment Details Patient Name: VICIE, CECH 06/28/2016 11:30 Date of Service: AM Medical Record [id number] Number: Patient Account Number: [id number] 1935-06-02 (81 y.o. Treating RN: Cornell Barman Date of Birth/Sex: Female) Other Clinician: Primary Care Physician: [name] Treating Christin Fudge Referring Physician: [name] Physician/Extender: Suella Grove in Treatment: 31 Edema Assessment Assessed: [Left: No] [Right: No] E[Left: dema] [Right: :] Calf Left: Right: Point of Measurement: 34 cm From Medial Instep 32.5 cm cm Ankle Left: Right: Point of Measurement: 10 cm From Medial Instep 21.8 cm cm Vascular Assessment Pulses: Dorsalis Pedis Palpable: [Left:Yes] Posterior Tibial Extremity colors, hair growth, and conditions: Extremity Color: [Left:Hyperpigmented] Hair Growth on Extremity: [Left:No] Temperature of Extremity: [Left:Warm] Capillary Refill: [Left:< 3  seconds] Dependent Rubor: [Left:No] Blanched when Elevated: [Left:No] Lipodermatosclerosis: [Left:No] Toe Nail Assessment Left: Right: Thick: Yes Discolored: No Deformed: No Improper Length and Hygiene: No Electronic Signature(s) DOSSIE, OCANAS ([id number]) Signed: 06/28/2016 5:31:46 PM By: Gretta Cool, RN, BSN, Kim RN, BSN Entered By: Gretta Cool, RN, BSN, Kim on 06/28/2016 11:42:19 Knierim, [name] ([id number]) -------------------------------------------------------------------------------- Multi Wound Chart Details Patient Name: [name] 06/28/2016 11:30 Date of  Service: AM Medical Record [id number] Number: Patient Account Number: [id number] [birth year] (81 y.o. Treating RN: Cornell Barman Date of Birth/Sex: Female) Other Clinician: Primary Care Physician: [name] Treating Christin Fudge Referring Physician: [name] Physician/Extender: Suella Grove in Treatment: 31 Vital Signs Height(in): 68 Pulse(bpm): 85 Weight(lbs): 178 Blood Pressure 119/58 (mmHg): Body Mass Index(BMI): 27 Temperature(F): 97.9 Respiratory Rate 16 (breaths/min): Photos: [N/A:N/A] Wound Location: Left Lower Leg - Posterior N/A N/A Wounding Event: Gradually Appeared N/A N/A Primary Etiology: Venous Leg Ulcer N/A N/A Comorbid History: Cataracts, Arrhythmia, N/A N/A Hypotension, Osteoarthritis Date Acquired: 06/26/2016 N/A N/A Weeks of Treatment: 0 N/A N/A Wound Status: Open N/A N/A Measurements L x W x D 1.8x1.7x0.1 N/A N/A (cm) Area (cm) : 2.403 N/A N/A Volume (cm) : 0.24 N/A N/A % Reduction in Area: 59.20% N/A N/A % Reduction in Volume: 59.30% N/A N/A Classification: Full Thickness Without N/A N/A Exposed Support Structures Exudate Amount: Medium N/A N/A Exudate Type: Serous N/A N/A Exudate Color: amber N/A N/A Wound Margin: Flat and Intact N/A N/A Delorenzo, Beyza C. ([id number]) Granulation Amount: None Present (0%) N/A N/A Necrotic Amount: Large (67-100%) N/A  N/A Necrotic Tissue: Eschar N/A N/A Exposed Structures: Fascia: No N/A N/A Fat: No Tendon: No Muscle: No Joint: No Bone: No Limited to Skin Breakdown Epithelialization: None N/A N/A Periwound Skin Texture: No Abnormalities Noted N/A N/A Periwound Skin No Abnormalities Noted N/A N/A Moisture: Periwound Skin Color: No Abnormalities Noted N/A N/A Tenderness on No N/A N/A Palpation: Wound Preparation: Ulcer Cleansing: N/A N/A Rinsed/Irrigated with Saline Topical Anesthetic Applied: Other: lidociane 4% Treatment Notes Electronic Signature(s) Signed: 06/28/2016 5:31:46 PM By: Gretta Cool, RN, BSN, Kim RN, BSN Entered By: Gretta Cool, RN, BSN, Kim on 06/28/2016 12:05:04 HOA, BRIGGS ([id number]) -------------------------------------------------------------------------------- Multi-Disciplinary Care Plan Details Patient Name: ARTA, STUMP 06/28/2016 11:30 Date of Service: AM Medical Record [id number] Number: Patient Account Number: [id number] 1935-02-03 (81 y.o. Treating RN: Cornell Barman Date of Birth/Sex: Female) Other Clinician: Primary Care Physician: [name] Treating Christin Fudge Referring Physician: [name] Physician/Extender: Suella Grove in Treatment: 28 Active Inactive Orientation to the Wound Care Program Nursing Diagnoses: Knowledge deficit related to the wound healing center program Goals: Patient/caregiver will verbalize understanding of the [facility] Program Date Initiated: 11/22/2015 Goal Status: Active Interventions: Provide education on orientation to the wound center Notes: Venous Leg Ulcer Nursing Diagnoses: Knowledge deficit related to disease process and management Potential for venous Insuffiency (use before diagnosis confirmed) Goals: Patient will maintain optimal edema control Date Initiated: 11/22/2015 Goal Status: Active Patient/caregiver will verbalize understanding of disease process and disease management Date  Initiated: 11/22/2015 Goal Status: Active Verify adequate tissue perfusion prior to therapeutic compression application Date Initiated: 11/22/2015 Goal Status: Active Interventions: Assess peripheral edema status every visit. Compression as ordered SARAHBETH, CASHIN ([id number]) Provide education on venous insufficiency Treatment Activities: Non-invasive vascular studies : 11/22/2015 Therapeutic compression applied : 11/22/2015 Notes: Wound/Skin Impairment Nursing Diagnoses: Impaired tissue integrity Knowledge deficit related to ulceration/compromised skin integrity Goals: Patient/caregiver will verbalize understanding of skin care regimen Date Initiated: 11/22/2015 Goal Status: Active Ulcer/skin breakdown will have a volume reduction of 30% by week 4 Date Initiated: 11/22/2015 Goal Status: Active Ulcer/skin breakdown will have a volume reduction of 50% by week 8 Date Initiated: 11/22/2015 Goal Status: Active Ulcer/skin breakdown will have a volume reduction of 80% by week 12 Date Initiated: 11/22/2015 Goal Status: Active Ulcer/skin breakdown will heal within 14 weeks Date Initiated: 11/22/2015 Goal Status: Active Interventions: Assess patient/caregiver ability to perform ulcer/skin care regimen  upon admission and as needed Assess ulceration(s) every visit Provide education on ulcer and skin care Treatment Activities: Skin care regimen initiated : 11/22/2015 Topical wound management initiated : 11/22/2015 Notes: Electronic Signature(s) Signed: 06/28/2016 5:31:46 PM By: Gretta Cool, RN, BSN, Kim RN, BSN Entered By: Gretta Cool, RN, BSN, Kim on 06/28/2016 12:04:54 Langi, [name] ([id number]) PEONY, BARNER ([id number]) -------------------------------------------------------------------------------- Pain Assessment Details Patient Name: DAELYN, PETTAWAY 06/28/2016 11:30 Date of Service: AM Medical Record [id number] Number: Patient Account Number: [id number] 1934/11/21 (81  y.o. Treating RN: Cornell Barman Date of Birth/Sex: Female) Other Clinician: Primary Care Physician: [name] Treating Christin Fudge Referring Physician: [name] Physician/Extender: Suella Grove in Treatment: 31 Active Problems Location of Pain Severity and Description of Pain Patient Has Paino Yes Site Locations Pain Location: Generalized Pain, Pain in Ulcers With Dressing Change: Yes Duration of the Pain. Constant / Intermittento Intermittent Rate the pain. Current Pain Level: 5 Worst Pain Level: 8 Least Pain Level: 3 Character of Pain Describe the Pain: Sharp, Shooting, Throbbing Pain Management and Medication Current Pain Management: Electronic Signature(s) Signed: 06/28/2016 5:31:46 PM By: Gretta Cool, RN, BSN, Kim RN, BSN Entered By: Gretta Cool, RN, BSN, Kim on 06/28/2016 11:31:06 [name] ([id number]) -------------------------------------------------------------------------------- Patient/Caregiver Education Details Patient Name: ILANI, OTTERSON 06/28/2016 11:30 Date of Service: AM Medical Record [id number] Number: Patient Account Number: [id number] Aug 28, 1934 (81 y.o. Treating RN: Cornell Barman Date of Birth/Gender: Female) Other Clinician: Primary Care Physician: [name] Treating Christin Fudge Referring Physician: [name] Physician/Extender: Suella Grove in Treatment: 31 Education Assessment Education Provided To: Patient Education Topics Provided Venous: Handouts: Controlling Swelling with Compression Stockings , Other: wear compression wrap on left leg Methods: Demonstration, Explain/Verbal Responses: State content correctly Wound/Skin Impairment: Electronic Signature(s) Signed: 06/28/2016 5:31:46 PM By: Gretta Cool, RN, BSN, Kim RN, BSN Entered By: Gretta Cool, RN, BSN, Kim on 06/28/2016 12:09:36 [name] ([id number]) -------------------------------------------------------------------------------- Wound Assessment Details Patient Name: ILIYANA, CONVEY 06/28/2016 11:30 Date of Service: AM Medical Record [id number] Number: Patient Account Number: [id number] 1934/07/15 (81 y.o. Treating RN: Cornell Barman Date of Birth/Sex: Female) Other Clinician: Primary Care Physician: [name] Treating Christin Fudge Referring Physician: [name] Physician/Extender: Suella Grove in Treatment: 31 Wound Status Wound Number: 9 Primary Venous Leg Ulcer Etiology: Wound Location: Left Lower Leg - Posterior Wound Status: Open Wounding Event: Gradually Appeared Comorbid Cataracts, Arrhythmia, Hypotension, Date Acquired: 06/26/2016 History: Osteoarthritis Weeks Of Treatment: 0 Clustered Wound: No Photos Wound Measurements Length: (cm) 1.8 Width: (cm) 1.7 Depth: (cm) 0.1 Area: (cm) 2.403 Volume: (cm) 0.24 % Reduction in Area: 59.2% % Reduction in Volume: 59.3% Epithelialization: None Tunneling: No Undermining: No Wound Description Full Thickness Without Exposed Classification: Support Structures Wound Margin: Flat and Intact Exudate Medium Amount: Exudate Type: Serous Exudate Color: amber Wound Bed Granulation Amount: None Present (0%) Exposed Structure Necrotic Amount: Large (67-100%) Fascia Exposed: No Necrotic Quality: Eschar Fat Layer Exposed: No Tendon Exposed: No Banfield, Keriana C. ([id number]) Muscle Exposed: No Joint Exposed: No Bone Exposed: No Limited to Skin Breakdown Periwound Skin Texture Texture Color No Abnormalities Noted: No No Abnormalities Noted: No Moisture No Abnormalities Noted: No Wound Preparation Ulcer Cleansing: Rinsed/Irrigated with Saline Topical Anesthetic Applied: Other: lidociane 4%, Treatment Notes Wound #9 (Left, Posterior Lower Leg) 1. Cleansed with: Clean wound with Normal Saline 2. Anesthetic Topical Lidocaine 4% cream to wound bed prior to debridement 4. Dressing Applied: Hydrogel 5. Secondary Dressing Applied Bordered Foam Dressing Electronic  Signature(s) Signed: 06/28/2016 5:31:46 PM By: Gretta Cool, RN, BSN, Kim RN, BSN Entered By: Gretta Cool,  RN, BSN, Kim on 06/28/2016 11:40:19 SHAQUOIA, MIERS ([id number]) -------------------------------------------------------------------------------- Vitals Details Patient Name: JHENE, WESTMORELAND 06/28/2016 11:30 Date of Service: AM Medical Record [id number] Number: Patient Account Number: [id number] 09-03-1934 (81 y.o. Treating RN: Cornell Barman Date of Birth/Sex: Female) Other Clinician: Primary Care Physician: [name] Treating Christin Fudge Referring Physician: [name] Physician/Extender: Suella Grove in Treatment: 31 Vital Signs Time Taken: 11:33 Temperature (F): 97.9 Height (in): 68 Pulse (bpm): 85 Weight (lbs): 178 Respiratory Rate (breaths/min): 16 Body Mass Index (BMI): 27.1 Blood Pressure (mmHg): 119/58 Reference Range: 80 - 120 mg / dl Electronic Signature(s) Signed: 06/28/2016 5:31:46 PM By: Gretta Cool, RN, BSN, Kim RN, BSN Entered By: Gretta Cool, RN, BSN, Kim on 06/28/2016 11:34:48

## 2016-07-01 ENCOUNTER — Encounter
Admission: RE | Admit: 2016-07-01 | Discharge: 2016-07-01 | Disposition: A | Discharge status: Home / Self Care | Payer: Medicare Other | Source: Ambulatory Visit | Attending: Internal Medicine | Admitting: Internal Medicine

## 2016-07-01 LAB — CBC WITH DIFFERENTIAL/PLATELET
BASOS ABS: 0.1 10*3/uL (ref 0–0.1)
Basophils Relative: 2 %
EOS ABS: 0.3 10*3/uL (ref 0–0.7)
EOS PCT: 7 %
HCT: 35.5 % (ref 35.0–47.0)
Hemoglobin: 11.7 g/dL — ABNORMAL LOW (ref 12.0–16.0)
Lymphocytes Relative: 24 %
Lymphs Abs: 0.9 10*3/uL — ABNORMAL LOW (ref 1.0–3.6)
MCH: 27.1 pg (ref 26.0–34.0)
MCHC: 32.9 g/dL (ref 32.0–36.0)
MCV: 82.3 fL (ref 80.0–100.0)
Monocytes Absolute: 0.6 10*3/uL (ref 0.2–0.9)
Monocytes Relative: 15 %
Neutro Abs: 1.9 10*3/uL (ref 1.4–6.5)
Neutrophils Relative %: 52 %
PLATELETS: 220 10*3/uL (ref 150–440)
RBC: 4.32 MIL/uL (ref 3.80–5.20)
RDW: 17.4 % — ABNORMAL HIGH (ref 11.5–14.5)
WBC: 3.7 10*3/uL (ref 3.6–11.0)

## 2016-07-01 LAB — BASIC METABOLIC PANEL
Anion gap: 9 (ref 5–15)
BUN: 38 mg/dL — AB (ref 6–20)
CALCIUM: 11 mg/dL — AB (ref 8.9–10.3)
CO2: 27 mmol/L (ref 22–32)
CREATININE: 1.59 mg/dL — AB (ref 0.44–1.00)
Chloride: 101 mmol/L (ref 101–111)
GFR calc non Af Amer: 30 mL/min — ABNORMAL LOW (ref >60)
GFR, EST AFRICAN AMERICAN: 35 mL/min — AB (ref >60)
Glucose, Bld: 87 mg/dL (ref 65–99)
Potassium: 5.2 mmol/L — ABNORMAL HIGH (ref 3.5–5.1)
SODIUM: 137 mmol/L (ref 135–145)

## 2016-07-01 LAB — PROTIME-INR
INR: 2.43
PROTHROMBIN TIME: 26.9 s — AB (ref 11.4–15.2)

## 2016-07-03 ENCOUNTER — Encounter: Payer: Medicare Other | Attending: Internal Medicine | Admitting: Internal Medicine

## 2016-07-03 DIAGNOSIS — L97221 Non-pressure chronic ulcer of left calf limited to breakdown of skin: Secondary | ICD-10-CM | POA: Diagnosis not present

## 2016-07-03 DIAGNOSIS — I4891 Unspecified atrial fibrillation: Secondary | ICD-10-CM | POA: Diagnosis not present

## 2016-07-03 DIAGNOSIS — Z79899 Other long term (current) drug therapy: Secondary | ICD-10-CM | POA: Insufficient documentation

## 2016-07-03 DIAGNOSIS — I87333 Chronic venous hypertension (idiopathic) with ulcer and inflammation of bilateral lower extremity: Secondary | ICD-10-CM | POA: Insufficient documentation

## 2016-07-03 DIAGNOSIS — Z952 Presence of prosthetic heart valve: Secondary | ICD-10-CM | POA: Insufficient documentation

## 2016-07-03 DIAGNOSIS — I5022 Chronic systolic (congestive) heart failure: Secondary | ICD-10-CM | POA: Insufficient documentation

## 2016-07-03 DIAGNOSIS — Z7901 Long term (current) use of anticoagulants: Secondary | ICD-10-CM | POA: Insufficient documentation

## 2016-07-03 DIAGNOSIS — I890 Lymphedema, not elsewhere classified: Secondary | ICD-10-CM | POA: Insufficient documentation

## 2016-07-03 DIAGNOSIS — L97812 Non-pressure chronic ulcer of other part of right lower leg with fat layer exposed: Secondary | ICD-10-CM | POA: Insufficient documentation

## 2016-07-03 LAB — PROTIME-INR
INR: 2.29
PROTHROMBIN TIME: 25.6 s — AB (ref 11.4–15.2)

## 2016-07-04 NOTE — Progress Notes (Addendum)
KALIAH, HADDAWAY ([id number]) Visit Report for 07/03/2016 Chief Complaint Document Details MEISHA, SALONE Date of Service: 07/03/2016 2:15 PM Patient Name: C. Patient Account Number: [id number] Medical Record Treating RN: [name] [id number] Number: Other Clinician: Nov 11, 1934 (82 y.o. Treating [name], [place] Date of Birth/Sex: Female) Physician/Extender: G Primary Care [name] Physician: Referring Physician: [name] in Treatment: 32 Information Obtained from: Patient Chief Complaint Patient here for reevaluation of her right lower externally ulcer Electronic Signature(s) Signed: 07/03/2016 5:37:12 PM By: [name] MD Entered By: [name] on 07/03/2016 15:44:37 Norbeck, [name] ([id number]) -------------------------------------------------------------------------------- Debridement Details [name] Date of Service: 07/03/2016 2:15 PM Patient Name: C. Patient Account Number: [id number] Medical Record Treating RN: [name] [id number] Number: Other Clinician: [birth year] (82 y.o. Treating [name], [place] Date of Birth/Sex: Female) Physician/Extender: G Primary Care [name] Physician: Referring Physician: [name] in Treatment: 32 Debridement Performed for Wound #9 Left,Posterior Lower Leg Assessment: Performed By: Physician [name], MD Debridement: Debridement Pre-procedure Yes - 14:55 Verification/Time Out Taken: Start Time: 14:56 Pain Control: Lidocaine 4% Topical Solution Level: Skin/Subcutaneous Tissue Total Area Debrided (L x 1.6 (cm) x 1.5 (cm) = 2.4 (cm) W): Tissue and other Viable, Non-Viable, Exudate, Fibrin/Slough, Subcutaneous material debrided: Instrument: Curette Bleeding: Minimum Hemostasis Achieved: Pressure End Time: 14:58 Procedural Pain: 0 Post Procedural Pain: 0 Response to Treatment: Procedure was tolerated well Post Debridement Measurements of Total  Wound Length: (cm) 1.6 Width: (cm) 1.5 Depth: (cm) 0.2 Volume: (cm) 0.377 Character of Wound/Ulcer Post Requires Further Debridement Debridement: Severity of Tissue Post Debridement: Fat layer exposed Post Procedure Diagnosis Same as Pre-procedure Electronic Signature(s) ANNJEANETTE, SARWAR ([id number]) Signed: 07/03/2016 5:37:12 PM By: [name] MD Signed: 07/03/2016 5:44:06 PM By: [name] Entered By: [name] on 07/03/2016 15:44:03 Stierwalt, [name] ([id number]) -------------------------------------------------------------------------------- HPI Details [name] Date of Service: 07/03/2016 2:15 PM Patient Name: C. Patient Account Number: [id number] Medical Record Treating RN: [name] [id number] Number: Other Clinician: 1935/01/04 (82 y.o. Treating [name], [place] Date of Birth/Sex: Female) Physician/Extender: G [place] Physician: Referring Physician: [name] in Treatment: 32 History of Present Illness HPI Description: 11/22/15; this is Bolt is a 82 year old woman who lives at home on her own. According to the patient and her daughter was present she has had long-standing edema in her legs dating back many years. She also has a history of chronic systolic heart failure, atrial fibrillation and is status post mitral valve replacement. Last echocardiogram I see in [facility] showed an ejection fraction of 40-45% she is on Lasix 60 mg a day and spironolactone 25 mg a day. Her current problem began in December around Christmas time she developed a small hematoma in the medial part of her left leg which rapidly expanded to a very large hematoma that required surgical debridement. This situation was complicated by the fact that the patient is on long-standing Coumadin for mechanical heart valve. She went to the OR had this evacuated on January 4 /17. The wound has gradually improved however she has developed  a small wounds around this area and more recently a wound on the right lateral leg. She is weeping edema fluid. The patient is already been to see vascular surgery. It was recommended that she wear Unna boots, she did not tolerate this due to pain in the left leg. She was then prescribed Juzo stockings and really can't get these on herself although truthfully there is probably too much edema for a Juzo stockings  currently. She is not a diabetic and has no history of PAD or claudication that I could elicit. She does not use the external compression pumps reliably. She comes today with notes from her primary physician and Dr. Ronalee Belts both recommending various forms of compression but the patient does not really complied with them. Has been using the external compression pumps with some regularity but certainly not daily on the right leg and this has helped. I also note that her daughter tells me the history that she does not sleep in bed at home. She has a hospital bed but with her legs up she finds this painful so she sleeps in the couch sitting up with her legs dependent. 11/29/15; the patient is arrives today accompanied by her son. He expresses satisfaction that she is maintained the compression all week. 12/06/15; the patient has kept her Profore light wraps on, we have good edema control no major change in the wounds we have been using Aquacel 12/13/15; changed to RTD last week. One of the 3 wounds on her left medial leg is healed she has 2 remaining wounds here and one on the right lateral leg. 12/18/2015 -- the patient was at Dr. Nino Parsley office today and he was seeing her for an arterial study. While the wrap was being removed she had an inadvertent laceration of the left proximal anterior leg which bled quite profusely and a compression dressing was applied over this and the patient was here to get her Profore wraps done. I was asked to see the patient to make an assessment and treat  appropriately. 12/26/15; the patient's injury on the left proximal leg and Steri-Strips removed after soaking. There is an open area here. The original wounds to still open on the right lateral and left medial leg. 01/03/16 patient's injury on her proximal left leg looks quite good. Still small open area on the medial left leg which appears to be improving. The area on the right lateral leg still substantially open with no real improvement in wound depth. Her edema control is marginal with a Profore light. We have been using RTD Pavel, Omari C. ([id number]) for 3-4 weeks without any major change 01/10/16: wounds without s/s of infection. vascular results are pending regarding arterial studies. 01/17/16; patient comes in today complaining of severe pain however I think most of this is in the right hip not related to her wounds. She continues with a oval-shaped wound on the right lateral leg, trauma to the left anterior leg just below her tibial plateau. She has a smaller eschar on the left anterior leg. She is being using Prisma however she informs [place] today that she is actually allergic to silver, nose this from a previous application at [facility] some years ago 01/24/16; edema is not so well controlled today, I think I reduced her to [place] bilaterally last week. The area which was a scissor injury on her left upper anterior lower leg is fully epithelialized. She only has a small open area remaining on the medial aspect of her left leg. The oval-shaped wound on the right mid lateral leg may be a bit smaller. Debrided of surface slough nonviable subcutaneous tissue. I had changed to collagen 2 weeks ago in an attempt to get this to close 01/31/16 all the patient's wounds on her left leg give healed. We have good edema control with bilateral Profore lites which she has been compliant with. She still has the oval-shaped wound on the right lateral calf allergic even  this appears to be slowly  improving. The patient has juxtalite stockings at home. She states she thinks she can put these on. She also has external compression pumps at home although I think her compliance with this has not been good in the past. She complains today of edema in her thighs. Tells me she takes 40 mg of Lasix daily 02/21/16; only 1 small wound remains on the lateral aspect of the right calf. She is using Juzo stockings on the left leg although she complains about difficulty in applying them. She has external compression pumps at home 02/28/16; the small open wound on her right lateral calf is improved in terms of wound area. It appears that she has a wrap injury on the anterior aspect of the upper leg 03/06/16; the small open wound on her right lateral calf has a very small open area remaining. The wrap injury on the anterior aspect of the upper leg also appears better. She arrives today in clinic with a history of dyspnea with minimal exertion starting with the last 2-4 days. She does not describe chest pain. Her son and our intake nurse think she has facial swelling. She has a history of an artificial mitral valve on Coumadin 03/13/16; the small wound on the right lateral calf is no better this week. The superior wrap injury anteriorly requires debridement of surface eschar and nonviable subcutaneous tissue. She has been to her primary doctor with regards to her dyspnea we identified last week. Per the patient's description her Lasix has been increased 03/21/2016 -- patient of Dr. Dellia Nims who could not keep her appointment yesterday but has come in today with the right lower leg looking good and this is the leg which has been treated in the recent past. However, her left lower extremity is extremely swollen, tender and edematous with redness and discoloration. 03/27/16; there is only 2 small areas remain on the right leg. The edema that was so concerning last week has come down however there is extensive  bruising on the lateral left leg medial left foot suggesting that she lost some blood into the leg itself. I checked her hemoglobin on 9/21 was 14.5. Her INR was over 6. Duplex ultrasound was negative for DVT 04/03/16 at this point in time today patient is actually doing substantially better in regard to the wound on the anterior right lower extremity. currently there is no slough or eschar noted and no evidence of erythema, discharge, or local infection. She is exhibiting no signs of systemic infection. She is tolerating the dressing changes as well as the wrapping at this point in time. 04/24/16; I have not seen Mrs. Leopard for 3 weeks. Apparently she was admitted to hospital for respiratory issues/also apparently has had multiple compression fractures and had kyphoplasty. When she was last here she only had one small open area remaining on the left leg she was using juxtalite stockings. Her son is upset about restarting the Coumadin which she blames or the swelling in her legs. She is on Coumadin for prophylaxis with a chronic artificial heart valve. SOMALIA, SEGLER ([id number]) 04/30/16 at this point in time patient has been tolerating the 3 layer compression wrap as well as the calcium alginate. 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 With tthat being said she does have the continued wound of the left lower extremity as well as the right medial lower extremity. The right side is significantly smaller compared to the left but actually is slough covered. fortunately she  has no significant tenderness at rest although with manipulation of the right location of the wound this is significantly tender. 05/07/16 for follow-up evaluation today both the patient's wounds appear to be significantly improved in size compared to last week. She is also having less pain at both locations currently. Her pain level at most is related to be a 1-2 out of 10. She does have some discharge but fortunately  no evidence of infection at this point.she also continues to tolerate the compression wraps well. 05/14/16; the patient's wound on the left leg actually looks as though it's on its way to closure. She is using collagen to this area. She has a small punched out area over the right medial calf. The cause of this is not really clear it has probably 0.4 cm of depth. She has an IV in her right hand which she states is for IV fluid when she develops low sodiums her potassiums, the etiology of this is not clear 05-21-16 she presents today with continued ulcerations the right lower extremity, posterior aspect. she has been using Iodoflex and compression therapy to the area. She denies any new injuries or trauma. She does have 2 areas proximal to this ulceration of dry crust along with an area to her left posterior lower leg that is purple discolored area appears similar to how her current ulceration started, she denies any trauma or injury to the left posterior leg we will monitor all of these areas. 05/28/16; the patient is down to 1 small open area on the right medial mid calf. This is however larger and in 50% of the surface area deeper approximately 0.4 cm. The reason for this deterioration is unclear. I've gone ahead and done a culture of this area she also tells [place] today that she is short of breath. She is also noted a swelling on her lower left eyelid 06/05/16; the patient is 1 small open area on the right medial mid calf. This looks about the same as last week. The deep area, medial 50% of the wound looks about the same. CandS I did last week was negative. The entire area looks about the same 06/12/16; not much change in this over the course of the last week. Patient comes in today complaining of extreme back pain. Apparently she has either a kyphoplasty or vertebral plasty scheduled at [facility] radiology tomorrow. For this reason no debridement today. We have been using Iodoflex 06/19/16;  patient arrived with the surface eschar from last week removed with a curet debrided of subcutaneous tissue. She tolerates this reasonably well. She is still in a skilled facility. States her vertebral plasty last week is made her pain bearable 06/26/16; patient arrived with a new wound on the posterior aspect of her left calf. She notices 3 or 4 days ago but is not certain how this happened. She states she simply felt a stinging sensation and one day on her foot and then the next day on the back of her calf. 06/28/2016 -- Ms. Reubin Milan called urgently saying that the pain in her left foot was drastically worse over the last several hours and though she removed her compression wrap she wanted to be seen before the long holiday. 07/03/16; she came in to see Dr. Con Memos on 12/29 for pain in her left foot/heel. Nothing much was found at the time and she states that the pain is better but involved her whole foot. She has a wound on the lateral aspect of the left calf  and the medial aspect of the right. The medial 1 looks as if it is just about healed however the left one still has considerable edema Electronic Signature(s) Signed: 07/03/2016 5:37:12 PM By: [name] MD Entered By: [name] on 07/03/2016 15:46:19 Legrande, [name] ([id number]) -------------------------------------------------------------------------------- Physical Exam Details [name] Date of Service: 07/03/2016 2:15 PM Patient Name: C. Patient Account Number: [id number] Medical Record Treating RN: [name] [id number] Number: Other Clinician: 05-16-1935 (82 y.o. Treating [name], [name] Date of Birth/Sex: Female) Physician/Extender: G Primary Care [name] Physician: Referring Physician: [name] in Treatment: 32 Constitutional Sitting or standing Blood Pressure is within target range for patient.. Pulse regular and within target range for patient.Marland Kitchen Respirations regular, non-labored  and within target range.. Temperature is normal and within the target range for the patient.. Patient's appearance is neat and clean. Appears in no acute distress. Well nourished and well developed.. Cardiovascular Pedal pulses palpable and strong bilaterally.. Edema present in both extremities. Left leg greater than right. I see no evidence of a DVT in the left leg however there is considerable hemosiderin deposition. Notes Wound exam; the area over her left lower extremity is about the size of a quarter and requires debridement of surface slough and nonviable subcutaneous tissue. The area on the medial right leg requires no debridement and appears to be progressing towards closure. Electronic Signature(s) Signed: 07/03/2016 5:37:12 PM By: [name] MD Entered By: [name] on 07/03/2016 16:14:40 Ashlock, [name] ([id number]) -------------------------------------------------------------------------------- Physician Orders Details [name] Date of Service: 07/03/2016 2:15 PM Patient Name: C. Patient Account Number: [id number] Medical Record Treating RN: [name] [id number] Number: Other Clinician: [birth year] (82 y.o. Treating [name], [place] Date of Birth/Sex: Female) Physician/Extender: G Primary Care [name] Physician: Referring Physician: [name] in Treatment: 33 Verbal / Phone Orders: Yes Clinician: Carolyne Fiscal, Debi Read Back and Verified: Yes Diagnosis Coding Wound Cleansing Wound #8 Right,Medial Lower Leg o Clean wound with Normal Saline. o Cleanse wound with mild soap and water o No tub bath. - give sink or bed baths Wound #9 Left,Posterior Lower Leg o Clean wound with Normal Saline. o Cleanse wound with mild soap and water o No tub bath. - give sink or bed baths Anesthetic Wound #8 Right,Medial Lower Leg o Topical Lidocaine 4% cream applied to wound bed prior to debridement - for clinic use Wound #9  Left,Posterior Lower Leg o Topical Lidocaine 4% cream applied to wound bed prior to debridement - for clinic use Primary Wound Dressing Wound #8 Right,Medial Lower Leg o Hydrogel o Promogran - collagen NO SILVER Wound #9 Left,Posterior Lower Leg o Hydrogel o Promogran - collagen NO SILVER Secondary Dressing Wound #8 Right,Medial Lower Leg o ABD pad o Dry Gauze Schey, Hiya C. ([id number]) o XtraSorb Wound #9 Left,Posterior Lower Leg o ABD pad o Dry Gauze o XtraSorb Dressing Change Frequency Wound #8 Right,Medial Lower Leg o Three times weekly - SNF change it Mondays and Fridays and pt is has it changed at wound care clinic on Wednesdays. Wound #9 Left,Posterior Lower Leg o Three times weekly - SNF change it Mondays and Fridays and pt is has it changed at wound care clinic on Wednesdays. Follow-up Appointments Wound #8 Right,Medial Lower Leg o Return Appointment in 1 week. Wound #9 Left,Posterior Lower Leg o Return Appointment in 1 week. Edema Control Wound #8 Right,Medial Lower Leg o Kerlix and Coban - Bilateral Wound #9 Left,Posterior Lower Leg o Kerlix and Coban - Bilateral Additional Orders / Instructions  Wound #8 Right,Medial Lower Leg o Increase protein intake. Wound #9 Left,Posterior Lower Leg o Increase protein intake. Electronic Signature(s) Signed: 07/03/2016 5:37:12 PM By: [name] MD Signed: 07/03/2016 5:44:06 PM By: [name] Entered By: [name] on 07/03/2016 15:07:14 [place]ushima, [name] ([id number]) -------------------------------------------------------------------------------- Problem List Details [name] Date of Service: 07/03/2016 2:15 PM Patient Name: C. Patient Account Number: [id number] Medical Record Treating RN: [name] [id number] Number: Other Clinician: 09/03/1934 (82 y.o. Treating [name], [place] Date of Birth/Sex: Female) Physician/Extender: G Primary  Care [name] Physician: Referring Physician: [name] in Treatment: 32 Active Problems ICD-10 Encounter Code Description Active Date Diagnosis L97.812 Non-pressure chronic ulcer of other part of right lower leg 04/30/2016 Yes with fat layer exposed I87.333 Chronic venous hypertension (idiopathic) with ulcer and 04/03/2016 Yes inflammation of bilateral lower extremity I89.0 Lymphedema, not elsewhere classified 04/03/2016 Yes G81.85 Chronic systolic (congestive) heart failure 04/03/2016 Yes L97.221 Non-pressure chronic ulcer of left calf limited to 07/03/2016 Yes breakdown of skin Inactive Problems Resolved Problems ICD-10 Code Description Active Date Resolved Date S81.812S Laceration without foreign body, left lower leg, sequela 04/03/2016 04/03/2016 Electronic Signature(s) Signed: 07/03/2016 5:37:12 PM By: [name] MD [name] ([id number]) Entered By: [name] on 07/03/2016 16:16:47 Follansbee, [name] ([id number]) -------------------------------------------------------------------------------- Progress Note Details [name] Date of Service: 07/03/2016 2:15 PM Patient Name: C. Patient Account Number: [id number] Medical Record Treating RN: [name] [id number] Number: Other Clinician: 03-21-1935 (82 y.o. Treating [name], [place] Date of Birth/Sex: Female) Physician/Extender: G Primary Care [name] Physician: Referring Physician: [name] in Treatment: 32 Subjective Chief Complaint Information obtained from Patient Patient here for reevaluation of her right lower externally ulcer History of Present Illness (HPI) 11/22/15; this is [place] is a 82 year old woman who lives at home on her own. According to the patient and her daughter was present she has had long-standing edema in her legs dating back many years. She also has a history of chronic systolic heart failure, atrial fibrillation and is status post mitral  valve replacement. Last echocardiogram I see in [facility] showed an ejection fraction of 40-45% she is on Lasix 60 mg a day and spironolactone 25 mg a day. Her current problem began in December around Christmas time she developed a small hematoma in the medial part of her left leg which rapidly expanded to a very large hematoma that required surgical debridement. This situation was complicated by the fact that the patient is on long-standing Coumadin for mechanical heart valve. She went to the OR had this evacuated on January 4 /17. The wound has gradually improved however she has developed a small wounds around this area and more recently a wound on the right lateral leg. She is weeping edema fluid. The patient is already been to see vascular surgery. It was recommended that she wear Unna boots, she did not tolerate this due to pain in the left leg. She was then prescribed Juzo stockings and really can't get these on herself although truthfully there is probably too much edema for a Juzo stockings currently. She is not a diabetic and has no history of PAD or claudication that I could elicit. She does not use the external compression pumps reliably. She comes today with notes from her primary physician and Dr. Ronalee Belts both recommending various forms of compression but the patient does not really complied with them. Has been using the external compression pumps with some regularity but certainly not daily on the right leg and this has  helped. I also note that her daughter tells me the history that she does not sleep in bed at home. She has a hospital bed but with her legs up she finds this painful so she sleeps in the couch sitting up with her legs dependent. 11/29/15; the patient is arrives today accompanied by her son. He expresses satisfaction that she is maintained the compression all week. 12/06/15; the patient has kept her Profore light wraps on, we have good edema control no major change  in the wounds we have been using Aquacel 12/13/15; changed to RTD last week. One of the 3 wounds on her left medial leg is healed she has 2 remaining wounds here and one on the right lateral leg. 12/18/2015 -- the patient was at Dr. Nino Parsley office today and he was seeing her for an arterial study. While the wrap was being removed she had an inadvertent laceration of the left proximal anterior leg which bled quite profusely and a compression dressing was applied over this and the patient was here to get her Profore wraps done. I was asked to see the patient to make an assessment and treat appropriately. SUZZETTE, GASPARRO ([id number]) 12/26/15; the patient's injury on the left proximal leg and Steri-Strips removed after soaking. There is an open area here. The original wounds to still open on the right lateral and left medial leg. 01/03/16 patient's injury on her proximal left leg looks quite good. Still small open area on the medial left leg which appears to be improving. The area on the right lateral leg still substantially open with no real improvement in wound depth. Her edema control is marginal with a Profore light. We have been using RTD for 3-4 weeks without any major change 01/10/16: wounds without s/s of infection. vascular results are pending regarding arterial studies. 01/17/16; patient comes in today complaining of severe pain however I think most of this is in the right hip not related to her wounds. She continues with a oval-shaped wound on the right lateral leg, trauma to the left anterior leg just below her tibial plateau. She has a smaller eschar on the left anterior leg. She is being using Prisma however she informs [place] today that she is actually allergic to silver, nose this from a previous application at [facility] some years ago 01/24/16; edema is not so well controlled today, I think I reduced her to [place] bilaterally last week. The area which was a scissor injury on her left  upper anterior lower leg is fully epithelialized. She only has a small open area remaining on the medial aspect of her left leg. The oval-shaped wound on the right mid lateral leg may be a bit smaller. Debrided of surface slough nonviable subcutaneous tissue. I had changed to collagen 2 weeks ago in an attempt to get this to close 01/31/16 all the patient's wounds on her left leg give healed. We have good edema control with bilateral Profore lites which she has been compliant with. She still has the oval-shaped wound on the right lateral calf allergic even this appears to be slowly improving. The patient has juxtalite stockings at home. She states she thinks she can put these on. She also has external compression pumps at home although I think her compliance with this has not been good in the past. She complains today of edema in her thighs. Tells me she takes 40 mg of Lasix daily 02/21/16; only 1 small wound remains on the lateral aspect of the right  calf. She is using Juzo stockings on the left leg although she complains about difficulty in applying them. She has external compression pumps at home 02/28/16; the small open wound on her right lateral calf is improved in terms of wound area. It appears that she has a wrap injury on the anterior aspect of the upper leg 03/06/16; the small open wound on her right lateral calf has a very small open area remaining. The wrap injury on the anterior aspect of the upper leg also appears better. She arrives today in clinic with a history of dyspnea with minimal exertion starting with the last 2-4 days. She does not describe chest pain. Her son and our intake nurse think she has facial swelling. She has a history of an artificial mitral valve on Coumadin 03/13/16; the small wound on the right lateral calf is no better this week. The superior wrap injury anteriorly requires debridement of surface eschar and nonviable subcutaneous tissue. She has been to her  primary doctor with regards to her dyspnea we identified last week. Per the patient's description her Lasix has been increased 03/21/2016 -- patient of Dr. Dellia Nims who could not keep her appointment yesterday but has come in today with the right lower leg looking good and this is the leg which has been treated in the recent past. However, her left lower extremity is extremely swollen, tender and edematous with redness and discoloration. 03/27/16; there is only 2 small areas remain on the right leg. The edema that was so concerning last week has come down however there is extensive bruising on the lateral left leg medial left foot suggesting that she lost some blood into the leg itself. I checked her hemoglobin on 9/21 was 14.5. Her INR was over 6. Duplex ultrasound was negative for DVT 04/03/16 at this point in time today patient is actually doing substantially better in regard to the wound on the anterior right lower extremity. currently there is no slough or eschar noted and no evidence of erythema, discharge, or local infection. She is exhibiting no signs of systemic infection. She is tolerating the dressing changes as well as the wrapping at this point in time. 04/24/16; I have not seen Mrs. Cochrane for 3 weeks. Apparently she was admitted to hospital for respiratory JANILLE, DRAUGHON. ([id number]) issues/also apparently has had multiple compression fractures and had kyphoplasty. When she was last here she only had one small open area remaining on the left leg she was using juxtalite stockings. Her son is upset about restarting the Coumadin which she blames or the swelling in her legs. She is on Coumadin for prophylaxis with a chronic artificial heart valve. 04/30/16 at this point in time patient has been tolerating the 3 layer compression wrap as well as the calcium alginate. 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 With tthat being said she does have the continued wound of the left  lower extremity as well as the right medial lower extremity. The right side is significantly smaller compared to the left but actually is slough covered. fortunately she has no significant tenderness at rest although with manipulation of the right location of the wound this is significantly tender. 05/07/16 for follow-up evaluation today both the patient's wounds appear to be significantly improved in size compared to last week. She is also having less pain at both locations currently. Her pain level at most is related to be a 1-2 out of 10. She does have some discharge but fortunately no evidence of  infection at this point.she also continues to tolerate the compression wraps well. 05/14/16; the patient's wound on the left leg actually looks as though it's on its way to closure. She is using collagen to this area. She has a small punched out area over the right medial calf. The cause of this is not really clear it has probably 0.4 cm of depth. She has an IV in her right hand which she states is for IV fluid when she develops low sodiums her potassiums, the etiology of this is not clear 05-21-16 she presents today with continued ulcerations the right lower extremity, posterior aspect. she has been using Iodoflex and compression therapy to the area. She denies any new injuries or trauma. She does have 2 areas proximal to this ulceration of dry crust along with an area to her left posterior lower leg that is purple discolored area appears similar to how her current ulceration started, she denies any trauma or injury to the left posterior leg we will monitor all of these areas. 05/28/16; the patient is down to 1 small open area on the right medial mid calf. This is however larger and in 50% of the surface area deeper approximately 0.4 cm. The reason for this deterioration is unclear. I've gone ahead and done a culture of this area she also tells [place] today that she is short of breath. She is also noted a  swelling on her lower left eyelid 06/05/16; the patient is 1 small open area on the right medial mid calf. This looks about the same as last week. The deep area, medial 50% of the wound looks about the same. CandS I did last week was negative. The entire area looks about the same 06/12/16; not much change in this over the course of the last week. Patient comes in today complaining of extreme back pain. Apparently she has either a kyphoplasty or vertebral plasty scheduled at [facility] radiology tomorrow. For this reason no debridement today. We have been using Iodoflex 06/19/16; patient arrived with the surface eschar from last week removed with a curet debrided of subcutaneous tissue. She tolerates this reasonably well. She is still in a skilled facility. States her vertebral plasty last week is made her pain bearable 06/26/16; patient arrived with a new wound on the posterior aspect of her left calf. She notices 3 or 4 days ago but is not certain how this happened. She states she simply felt a stinging sensation and one day on her foot and then the next day on the back of her calf. 06/28/2016 -- Ms. Reubin Milan called urgently saying that the pain in her left foot was drastically worse over the last several hours and though she removed her compression wrap she wanted to be seen before the long holiday. 07/03/16; she came in to see Dr. Con Memos on 12/29 for pain in her left foot/heel. Nothing much was found at the time and she states that the pain is better but involved her whole foot. She has a wound on the lateral aspect of the left calf and the medial aspect of the right. The medial 1 looks as if it is just about healed however the left one still has considerable edema Whiten, Bailynn C. ([id number]) Objective Constitutional Sitting or standing Blood Pressure is within target range for patient.. Pulse regular and within target range for patient.Marland Kitchen Respirations regular, non-labored and within target  range.. Temperature is normal and within the target range for the patient.. Patient's appearance is neat and  clean. Appears in no acute distress. Well nourished and well developed.. Vitals Time Taken: 2:32 PM, Height: 68 in, Weight: 178 lbs, BMI: 27.1, Temperature: 97.7 F, Pulse: 71 bpm, Respiratory Rate: 16 breaths/min, Blood Pressure: 126/60 mmHg. Cardiovascular Pedal pulses palpable and strong bilaterally.. Edema present in both extremities. Left leg greater than right. I see no evidence of a DVT in the left leg however there is considerable hemosiderin deposition. General Notes: Wound exam; the area over her left lower extremity is about the size of a quarter and requires debridement of surface slough and nonviable subcutaneous tissue. The area on the medial right leg requires no debridement and appears to be progressing towards closure. Integumentary (Hair, Skin) Wound #8 status is Open. Original cause of wound was Trauma. The wound is located on the Right,Medial Lower Leg. The wound measures 0.3cm length x 0.8cm width x 0.2cm depth; 0.188cm^2 area and 0.038cm^3 volume. There is fat exposed. There is no tunneling noted. There is a medium amount of serosanguineous drainage noted. The wound margin is distinct with the outline attached to the wound base. There is no granulation within the wound bed. There is a large (67-100%) amount of necrotic tissue within the wound bed including Eschar and Adherent Slough. The periwound skin appearance exhibited: Dry/Scaly. Periwound temperature was noted as No Abnormality. The periwound has tenderness on palpation. Wound #9 status is Open. Original cause of wound was Gradually Appeared. The wound is located on the Left,Posterior Lower Leg. The wound measures 1.6cm length x 1.5cm width x 0.1cm depth; 1.885cm^2 area and 0.188cm^3 volume. The wound is limited to skin breakdown. There is no tunneling or undermining noted. There is a large amount of serous  drainage noted. The wound margin is flat and intact. There is small (1-33%) red granulation within the wound bed. There is a large (67-100%) amount of necrotic tissue within the wound bed including Adherent Slough. The periwound skin appearance exhibited: Moist. Periwound temperature was noted as No Abnormality. The periwound has tenderness on palpation. Assessment Active Problems ICD-10 MORGHAN, KESTER ([id number]) [phone] - Non-pressure chronic ulcer of other part of right lower leg with fat layer exposed I87.333 - Chronic venous hypertension (idiopathic) with ulcer and inflammation of bilateral lower extremity I89.0 - Lymphedema, not elsewhere classified S01.09 - Chronic systolic (congestive) heart failure L97.221 - Non-pressure chronic ulcer of left calf limited to breakdown of skin Procedures Wound #9 Wound #9 is a Venous Leg Ulcer located on the Left,Posterior Lower Leg . There was a Skin/Subcutaneous Tissue Debridement ([id number]) debridement with total area of 2.4 sq cm performed by [name], MD. with the following instrument(s): Curette to remove Viable and Non-Viable tissue/material including Exudate, Fibrin/Slough, and Subcutaneous after achieving pain control using Lidocaine 4% Topical Solution. A time out was conducted at 14:55, prior to the start of the procedure. A Minimum amount of bleeding was controlled with Pressure. The procedure was tolerated well with a pain level of 0 throughout and a pain level of 0 following the procedure. Post Debridement Measurements: 1.6cm length x 1.5cm width x 0.2cm depth; 0.377cm^3 volume. Character of Wound/Ulcer Post Debridement requires further debridement. Severity of Tissue Post Debridement is: Fat layer exposed. Post procedure Diagnosis Wound #9: Same as Pre-Procedure Plan Wound Cleansing: Wound #8 Right,Medial Lower Leg: Clean wound with Normal Saline. Cleanse wound with mild soap and water No tub bath. - give sink  or bed baths Wound #9 Left,Posterior Lower Leg: Clean wound with Normal Saline. Cleanse wound with mild soap and  water No tub bath. - give sink or bed baths Anesthetic: Wound #8 Right,Medial Lower Leg: Topical Lidocaine 4% cream applied to wound bed prior to debridement - for clinic use Wound #9 Left,Posterior Lower Leg: Topical Lidocaine 4% cream applied to wound bed prior to debridement - for clinic use Primary Wound Dressing: Wound #8 Right,Medial Lower Leg: Hydrogel Mells, Juley C. ([id number]) Promogran - collagen NO SILVER Wound #9 Left,Posterior Lower Leg: Hydrogel Promogran - collagen NO SILVER Secondary Dressing: Wound #8 Right,Medial Lower Leg: ABD pad Dry Gauze XtraSorb Wound #9 Left,Posterior Lower Leg: ABD pad Dry Gauze XtraSorb Dressing Change Frequency: Wound #8 Right,Medial Lower Leg: Three times weekly - SNF change it Mondays and Fridays and pt is has it changed at wound care clinic on Wednesdays. Wound #9 Left,Posterior Lower Leg: Three times weekly - SNF change it Mondays and Fridays and pt is has it changed at wound care clinic on Wednesdays. Follow-up Appointments: Wound #8 Right,Medial Lower Leg: Return Appointment in 1 week. Wound #9 Left,Posterior Lower Leg: Return Appointment in 1 week. Edema Control: Wound #8 Right,Medial Lower Leg: Kerlix and Coban - Bilateral Wound #9 Left,Posterior Lower Leg: Kerlix and Coban - Bilateral Additional Orders / Instructions: Wound #8 Right,Medial Lower Leg: Increase protein intake. Wound #9 Left,Posterior Lower Leg: Increase protein intake. #1 we applied Prisma and hydrogel to both wound areas however elected to wrap both legs and Kerlix and Coban. This is to control the edema especially in the right leg. This can be changed every second day at the facility Electronic Signature(s) Signed: 07/04/2016 4:23:41 PM By: Gretta Cool RN, BSN, Kim RN, BSN Signed: 07/09/2016 4:24:12 PM By: [name] MD Previous  Signature: 07/03/2016 5:37:12 PM Version By: [name] MD SHALANDRA, LEU ([id number]) Entered By: Gretta Cool RN, BSN, Kim on 07/04/2016 16:23:18 [place], [name] ([id number]) -------------------------------------------------------------------------------- SuperBill Details Patient Name: KADESIA, ROBEL. Date of Service: 07/03/2016 Medical Record Patient Account Number: [id number] Number: Treating RN: [name] [birth year] (81 y.o. Other Clinician: Date of Birth/Sex: Female) Treating [name], [name] Primary Care Physician/Extender: Claudette Laws Physician: Suella Grove in Treatment: 32 Referring Physician: [name] Diagnosis Coding ICD-10 Codes Code Description [phone] Non-pressure chronic ulcer of other part of right lower leg with fat layer exposed Chronic venous hypertension (idiopathic) with ulcer and inflammation of bilateral lower I87.333 extremity I89.0 Lymphedema, not elsewhere classified Z60.10 Chronic systolic (congestive) heart failure L97.221 Non-pressure chronic ulcer of left calf limited to breakdown of skin Facility Procedures CPT4 Code Description: [id number] - DEB SUBQ TISSUE 20 SQ CM/< ICD-10 Description Diagnosis L97.221 Non-pressure chronic ulcer of left calf limited to Modifier: breakdown of sk Quantity: 1 in Physician Procedures CPT4 Code Description: [id number] - WC PHYS SUBQ TISS 20 SQ CM ICD-10 Description Diagnosis L97.221 Non-pressure chronic ulcer of left calf limited to Modifier: breakdown of sk Quantity: 1 in Electronic Signature(s) Signed: 07/03/2016 5:37:12 PM By: [name] MD Entered By: [name] on 07/03/2016 16:17:26

## 2016-07-04 NOTE — Progress Notes (Signed)
TILLEY, FAETH ([id number]) Visit Report for 07/03/2016 Arrival Information Details Patient Name: [name], [name]. Date of Service: 07/03/2016 2:15 PM Medical Record Patient Account Number: [id number] Number: Treating RN: [name] 1934-12-24 (82 y.o. Other Clinician: Date of Birth/Sex: Female) Treating [name], [name] Primary Care Physician/Extender: Claudette Laws Physician: Referring Physician: [name] in Treatment: 33 Visit Information History Since Last Visit All ordered tests and consults were completed: No Patient Arrived: Wheel Chair Added or deleted any medications: No Arrival Time: 14:26 Any new allergies or adverse reactions: No Accompanied By: caregiver Had a fall or experienced change in No Transfer Assistance: EasyPivot Patient activities of daily living that may affect Lift risk of falls: Patient Identification Verified: Yes Signs or symptoms of abuse/neglect since last No Secondary Verification Process Yes visito Completed: Hospitalized since last visit: No Patient Requires Transmission- No Has Dressing in Place as Prescribed: Yes Based Precautions: Has Compression in Place as Prescribed: Yes Patient Has Alerts: Yes Pain Present Now: No Patient Alerts: Patient on Blood Thinner Santiago!!! Electronic Signature(s) Signed: 07/03/2016 5:44:06 PM By: [name] Entered By: [name] on 07/03/2016 14:32:33 Mccrae, [name] ([id number]) -------------------------------------------------------------------------------- Encounter Discharge Information Details Patient Name: Carrie Mulligan C. Date of Service: 07/03/2016 2:15 PM Medical Record Patient Account Number: [id number] Number: Treating RN: [name] 05-02-1935 (82 y.o. Other Clinician: Date of Birth/Sex: Female) Treating [name], [name] Primary Care Physician/Extender: Claudette Laws Physician: Referring Physician: [name] in Treatment: 32 Encounter Discharge Information Items Discharge Pain Level: 0 Discharge Condition: Stable Ambulatory Status: Wheelchair Discharge Destination: Nursing Home Transportation: Private Auto Accompanied By: caregiver Schedule Follow-up Appointment: Yes Medication Reconciliation completed Yes and provided to Patient/Care Dmya Long: Provided on Clinical Summary of Care: 07/03/2016 Form Type Recipient Paper Patient [place] Electronic Signature(s) Signed: 07/03/2016 5:44:06 PM By: [name] Previous Signature: 07/03/2016 3:29:01 PM Version By: Ruthine Dose Entered By: [name] on 07/03/2016 15:32:09 Hearty, [name] ([id number]) -------------------------------------------------------------------------------- Lower Extremity Assessment Details Patient Name: [name], [name] Date of Service: 07/03/2016 2:15 PM Medical Record Patient Account Number: [id number] Number: Treating RN: [name] 12-13-1934 (82 y.o. Other Clinician: Date of Birth/Sex: Female) Treating [name], [name] Primary Care Physician/Extender: Claudette Laws Physician: Referring Physician: [name] in Treatment: 32 Edema Assessment Assessed: [Left: No] [Right: No] E[Left: dema] [Right: :] Calf Left: Right: Point of Measurement: 34 cm From Medial Instep 30.5 cm 36.5 cm Ankle Left: Right: Point of Measurement: 10 cm From Medial Instep 22 cm 23.6 cm Vascular Assessment Pulses: Posterior Tibial Extremity colors, hair growth, and conditions: Extremity Color: [Left:Hyperpigmented] [Right:Hyperpigmented] Temperature of Extremity: [Left:Warm] [Right:Warm] Capillary Refill: [Left:< 3 seconds] [Right:< 3 seconds] Toe Nail Assessment Left: Right: Thick: Yes Yes Discolored: No No Deformed: No No Improper Length and Hygiene: No No Electronic Signature(s) Signed: 07/03/2016 5:44:06 PM By: [name] Entered By: [name] on 07/03/2016 14:47:44 Bearman, Beth C. ([id number]) -------------------------------------------------------------------------------- Multi Wound Chart Details Patient Name: Carrie Mulligan C. Date of Service: 07/03/2016 2:15 PM Medical Record Patient Account Number: [id number] Number: Treating RN: [name] August 23, 1934 (82 y.o. Other Clinician: Date of Birth/Sex: Female) Treating [name], [name] Primary Care Physician/Extender: Claudette Laws Physician: Referring Physician: [name] in Treatment: 32 Vital Signs Height(in): 68 Pulse(bpm): 71 Weight(lbs): 178 Blood Pressure 126/60 (mmHg): Body Mass Index(BMI): 27 Temperature(F): 97.7 Respiratory Rate 16 (breaths/min): Photos: [8:No Photos] [9:No Photos] [N/A:N/A] Wound Location: [8:Right Lower Leg - Medial] [9:Left Lower Leg - Posterior] [N/A:N/A] Wounding Event: [  8:Trauma] [9:Gradually Appeared] [N/A:N/A] Primary Etiology: [8:Venous Leg Ulcer] [9:Venous Leg Ulcer] [N/A:N/A] Comorbid History: [8:Cataracts, Arrhythmia, Hypotension, Osteoarthritis] [9:Cataracts, Arrhythmia, Hypotension, Osteoarthritis] [N/A:N/A] Date Acquired: [8:04/06/2016] [9:06/26/2016] [N/A:N/A] Weeks of Treatment: [8:10] [9:1] [N/A:N/A] Wound Status: [8:Open] [9:Open] [N/A:N/A] Measurements L x W x D 0.3x0.8x0.2 [9:1.6x1.5x0.1] [N/A:N/A] (cm) Area (cm) : [8:0.188] [9:1.885] [N/A:N/A] Volume (cm) : [8:0.038] [9:0.188] [N/A:N/A] % Reduction in Area: [8:14.50%] [9:68.00%] [N/A:N/A] % Reduction in Volume: -72.70% [9:68.10%] [N/A:N/A] Classification: [8:Full Thickness Without Exposed Support Structures] [9:Full Thickness Without Exposed Support Structures] [N/A:N/A] Exudate Amount: [8:Medium] [9:Large] [N/A:N/A] Exudate Type: [8:Serosanguineous] [9:Serous] [N/A:N/A] Exudate Color: [8:red, brown] [9:amber] [N/A:N/A] Wound Margin: [8:Distinct, outline attached] [9:Flat and Intact]  [N/A:N/A] Granulation Amount: [8:None Present (0%)] [9:Small (1-33%)] [N/A:N/A] Granulation Quality: [8:N/A] [9:Red] [N/A:N/A] Necrotic Amount: [8:Large (67-100%)] [9:Large (67-100%)] [N/A:N/A] Necrotic Tissue: [8:Eschar, Adherent Slough] [9:Adherent Slough] [N/A:N/A] Exposed Structures: Fat: Yes Fascia: No N/A Fascia: No Fat: No Tendon: No Tendon: No Muscle: No Muscle: No Joint: No Joint: No Bone: No Bone: No Limited to Skin Breakdown Epithelialization: None None N/A Debridement: N/A Debridement ([id number]) Pre-procedure N/A 14:55 N/A Verification/Time Out Taken: Pain Control: N/A Lidocaine 4% Topical N/A Solution Tissue Debrided: N/A Fibrin/Slough, Exudates, N/A Subcutaneous Level: N/A Skin/Subcutaneous N/A Tissue Debridement Area (sq N/A 2.4 N/A cm): Instrument: N/A Curette N/A Bleeding: N/A Minimum N/A Hemostasis Achieved: N/A Pressure N/A Procedural Pain: N/A 0 N/A Post Procedural Pain: N/A 0 N/A Debridement Treatment N/A Procedure was tolerated N/A Response: well Post Debridement N/A 1.6x1.5x0.2 N/A Measurements L x W x D (cm) Post Debridement N/A 0.377 N/A Volume: (cm) Periwound Skin Texture: No Abnormalities Noted No Abnormalities Noted N/A Periwound Skin Dry/Scaly: Yes Moist: Yes N/A Moisture: Periwound Skin Color: No Abnormalities Noted No Abnormalities Noted N/A Temperature: No Abnormality No Abnormality N/A Tenderness on Yes Yes N/A Palpation: Wound Preparation: Ulcer Cleansing: Ulcer Cleansing: N/A Rinsed/Irrigated with Rinsed/Irrigated with Saline Saline Topical Anesthetic Topical Anesthetic Applied: Other: lidocaine Applied: Other: lidociane 4% 4% Procedures Performed: N/A Debridement N/A Blowers, Chance C. ([id number]) Treatment Notes Wound #8 (Right, Medial Lower Leg) 1. Cleansed with: Clean wound with Normal Saline Cleanse wound with antibacterial soap and water 2. Anesthetic Topical Lidocaine 4% cream to wound bed  prior to debridement 4. Dressing Applied: Hydrogel Promogran 5. Secondary Dressing Applied ABD Pad Dry Gauze 7. Secured with Tape Notes kerlix, coban, xtrasorb Wound #9 (Left, Posterior Lower Leg) 1. Cleansed with: Clean wound with Normal Saline Cleanse wound with antibacterial soap and water 2. Anesthetic Topical Lidocaine 4% cream to wound bed prior to debridement 4. Dressing Applied: Hydrogel Promogran 5. Secondary Dressing Applied ABD Pad Dry Gauze 7. Secured with Tape Notes kerlix, coban, Manufacturing systems engineer) Signed: 07/03/2016 5:37:12 PM By: [name] MD Entered By: [name] on 07/03/2016 15:42:20 Roehrs, [name] ([id number]) -------------------------------------------------------------------------------- Multi-Disciplinary Care Plan Details Patient Name: KEATYN, LUCK. Date of Service: 07/03/2016 2:15 PM Medical Record Patient Account Number: [id number] Number: Treating RN: [name] 1934-11-27 (81 y.o. Other Clinician: Date of Birth/Sex: Female) Treating [name], [name] Primary Care Physician/Extender: Claudette Laws Physician: Referring Physician: [name] in Treatment: 54 Active Inactive Orientation to the Wound Care Program Nursing Diagnoses: Knowledge deficit related to the wound healing center program Goals: Patient/caregiver will verbalize understanding of the [facility] Date Initiated: 11/22/2015 Goal Status: Active Interventions: Provide education on orientation to the wound center Notes: Venous Leg Ulcer Nursing Diagnoses: Knowledge deficit related to disease process and management Potential for venous Insuffiency (use before diagnosis confirmed) Goals:  Patient will maintain optimal edema control Date Initiated: 11/22/2015 Goal Status: Active Patient/caregiver will verbalize understanding of disease process and disease management Date Initiated: 11/22/2015 Goal  Status: Active Verify adequate tissue perfusion prior to therapeutic compression application Date Initiated: 11/22/2015 Goal Status: Active Interventions: Assess peripheral edema status every visit. VAUDINE, DUTAN ([id number]) Compression as ordered Provide education on venous insufficiency Treatment Activities: Non-invasive vascular studies : 11/22/2015 Therapeutic compression applied : 11/22/2015 Notes: Wound/Skin Impairment Nursing Diagnoses: Impaired tissue integrity Knowledge deficit related to ulceration/compromised skin integrity Goals: Patient/caregiver will verbalize understanding of skin care regimen Date Initiated: 11/22/2015 Goal Status: Active Ulcer/skin breakdown will have a volume reduction of 30% by week 4 Date Initiated: 11/22/2015 Goal Status: Active Ulcer/skin breakdown will have a volume reduction of 50% by week 8 Date Initiated: 11/22/2015 Goal Status: Active Ulcer/skin breakdown will have a volume reduction of 80% by week 12 Date Initiated: 11/22/2015 Goal Status: Active Ulcer/skin breakdown will heal within 14 weeks Date Initiated: 11/22/2015 Goal Status: Active Interventions: Assess patient/caregiver ability to perform ulcer/skin care regimen upon admission and as needed Assess ulceration(s) every visit Provide education on ulcer and skin care Treatment Activities: Skin care regimen initiated : 11/22/2015 Topical wound management initiated : 11/22/2015 Notes: Electronic Signature(s) Signed: 07/03/2016 5:44:06 PM By: De Burrs, [name] ([id number]) Entered By: [name] on 07/03/2016 14:51:03 Buehrer, [name] ([id number]) -------------------------------------------------------------------------------- Pain Assessment Details Patient Name: Carrie Mulligan C. Date of Service: 07/03/2016 2:15 PM Medical Record Patient Account Number: [id number] Number: Treating RN: [name] [birth year] (81 y.o. Other  Clinician: Date of Birth/Sex: Female) Treating [name], [name] Primary Care Physician/Extender: Claudette Laws Physician: Referring Physician: [name] in Treatment: 32 Active Problems Location of Pain Severity and Description of Pain Patient Has Paino No Site Locations With Dressing Change: No Pain Management and Medication Current Pain Management: Electronic Signature(s) Signed: 07/03/2016 5:44:06 PM By: [name] Entered By: [name] on 07/03/2016 14:32:38 Clack, [name] ([id number]) -------------------------------------------------------------------------------- Patient/Caregiver Education Details Patient Name: [name] Date of Service: 07/03/2016 2:15 PM Medical Record Patient Account Number: [id number] Number: Treating RN: [name] September 20, 1934 (81 y.o. Other Clinician: Date of Birth/Gender: Female) Treating [name], [name] Primary Care Physician/Extender: Claudette Laws Physician: Suella [place] in Treatment: 32 Referring Physician: [name] Education Assessment Education Provided To: Patient Education Topics Provided Wound/Skin Impairment: Handouts: Other: change dressing as ordered Methods: Demonstration, Explain/Verbal Responses: State content correctly Electronic Signature(s) Signed: 07/03/2016 5:44:06 PM By: [name] Entered By: [name] on 07/03/2016 15:32:28 Samudio, [name] ([id number]) -------------------------------------------------------------------------------- Wound Assessment Details Patient Name: Carrie Mulligan C. Date of Service: 07/03/2016 2:15 PM Medical Record Patient Account Number: [id number] Number: Treating RN: [name] 1935-04-21 (81 y.o. Other Clinician: Date of Birth/Sex: Female) Treating [name], [name] Primary Care Physician/Extender: Claudette Laws Physician: Referring Physician: [name] in Treatment: 32 Wound Status Wound  Number: 8 Primary Venous Leg Ulcer Etiology: Wound Location: Right Lower Leg - Medial Wound Status: Open Wounding Event: Trauma Comorbid Cataracts, Arrhythmia, Hypotension, Date Acquired: 04/06/2016 History: Osteoarthritis Weeks Of Treatment: 10 Clustered Wound: No Photos Photo Uploaded By: [name] on 07/03/2016 16:37:53 Wound Measurements Length: (cm) 0.3 Width: (cm) 0.8 Depth: (cm) 0.2 Area: (cm) 0.188 Volume: (cm) 0.038 % Reduction in Area: 14.5% % Reduction in Volume: -72.7% Epithelialization: None Tunneling: No Wound Description Full Thickness Without Exposed Classification: Support Structures Wound Margin: Distinct, outline attached Exudate Medium Amount: Exudate Type: Serosanguineous Exudate Color: red, brown Foul Odor After Cleansing: No Wound  Bed RIKO, LUMSDEN ([id number]) Granulation Amount: None Present (0%) Exposed Structure Necrotic Amount: Large (67-100%) Fascia Exposed: No Necrotic Quality: Eschar, Adherent Slough Fat Layer Exposed: Yes Tendon Exposed: No Muscle Exposed: No Joint Exposed: No Bone Exposed: No Periwound Skin Texture Texture Color No Abnormalities Noted: No No Abnormalities Noted: No Moisture Temperature / Pain No Abnormalities Noted: No Temperature: No Abnormality Dry / Scaly: Yes Tenderness on Palpation: Yes Wound Preparation Ulcer Cleansing: Rinsed/Irrigated with Saline Topical Anesthetic Applied: Other: lidocaine 4%, Treatment Notes Wound #8 (Right, Medial Lower Leg) 1. Cleansed with: Clean wound with Normal Saline Cleanse wound with antibacterial soap and water 2. Anesthetic Topical Lidocaine 4% cream to wound bed prior to debridement 4. Dressing Applied: Hydrogel Promogran 5. Secondary Dressing Applied ABD Pad Dry Gauze 7. Secured with Tape Notes kerlix, coban, Manufacturing systems engineer) Signed: 07/03/2016 5:44:06 PM By: [name] Entered By: [name] on 07/03/2016  14:49:39 [place], [place]. ([id number]) -------------------------------------------------------------------------------- Wound Assessment Details Patient Name: [name], Lakia C. Date of Service: 07/03/2016 2:15 PM Medical Record Patient Account Number: [id number] Number: Treating RN: [name] 1934-09-20 (81 y.o. Other Clinician: Date of Birth/Sex: Female) Treating [name], [name] Primary Care Physician/Extender: Claudette Laws Physician: Referring Physician: [name] in Treatment: 32 Wound Status Wound Number: 9 Primary Venous Leg Ulcer Etiology: Wound Location: Left Lower Leg - Posterior Wound Status: Open Wounding Event: Gradually Appeared Comorbid Cataracts, Arrhythmia, Hypotension, Date Acquired: 06/26/2016 History: Osteoarthritis Weeks Of Treatment: 1 Clustered Wound: No Photos Photo Uploaded By: [name] on 07/03/2016 16:38:22 Wound Measurements Length: (cm) 1.6 % Reduction in Width: (cm) 1.5 % Reduction in Depth: (cm) 0.1 Epithelializati Area: (cm) 1.885 Tunneling: Volume: (cm) 0.188 Undermining: Area: 68% Volume: 68.1% on: None No No Wound Description Full Thickness Without Exposed Classification: Support Structures Wound Margin: Flat and Intact Exudate Large Amount: Exudate Type: Serous Exudate Color: amber Wound Bed Kai, Ersie C. ([id number]) Granulation Amount: Small (1-33%) Exposed Structure Granulation Quality: Red Fascia Exposed: No Necrotic Amount: Large (67-100%) Fat Layer Exposed: No Necrotic Quality: Adherent Slough Tendon Exposed: No Muscle Exposed: No Joint Exposed: No Bone Exposed: No Limited to Skin Breakdown Periwound Skin Texture Texture Color No Abnormalities Noted: No No Abnormalities Noted: No Moisture Temperature / Pain No Abnormalities Noted: No Temperature: No Abnormality Moist: Yes Tenderness on Palpation: Yes Wound Preparation Ulcer Cleansing: Rinsed/Irrigated  with Saline Topical Anesthetic Applied: Other: lidociane 4%, Treatment Notes Wound #9 (Left, Posterior Lower Leg) 1. Cleansed with: Clean wound with Normal Saline Cleanse wound with antibacterial soap and water 2. Anesthetic Topical Lidocaine 4% cream to wound bed prior to debridement 4. Dressing Applied: Hydrogel Promogran 5. Secondary Dressing Applied ABD Pad Dry Gauze 7. Secured with Tape Notes kerlix, coban, Manufacturing systems engineer) Signed: 07/03/2016 5:44:06 PM By: [name] Entered By: [name] on 07/03/2016 14:49:04 Trang, Mckenlee Loletha Grayer ([id number]) -------------------------------------------------------------------------------- Vitals Details Patient Name: Carrie Mulligan C. Date of Service: 07/03/2016 2:15 PM Medical Record Patient Account Number: [id number] Number: Treating RN: [name] [birth year] (81 y.o. Other Clinician: Date of Birth/Sex: Female) Treating [name], [name] Primary Care Physician/Extender: Claudette Laws Physician: Referring Physician: [name] in Treatment: 32 Vital Signs Time Taken: 14:32 Temperature (F): 97.7 Height (in): 68 Pulse (bpm): 71 Weight (lbs): 178 Respiratory Rate (breaths/min): 16 Body Mass Index (BMI): 27.1 Blood Pressure (mmHg): 126/60 Reference Range: 80 - 120 mg / dl Electronic Signature(s) Signed: 07/03/2016 5:44:06 PM By: [name] Entered By: [name] on 07/03/2016 14:33:02

## 2016-07-05 ENCOUNTER — Other Ambulatory Visit
Admission: RE | Admit: 2016-07-05 | Discharge: 2016-07-05 | Disposition: A | Discharge status: Home / Self Care | Payer: Medicare Other | Source: Skilled Nursing Facility | Attending: Internal Medicine | Admitting: Internal Medicine

## 2016-07-05 DIAGNOSIS — N189 Chronic kidney disease, unspecified: Secondary | ICD-10-CM | POA: Diagnosis present

## 2016-07-05 DIAGNOSIS — I4891 Unspecified atrial fibrillation: Secondary | ICD-10-CM | POA: Diagnosis present

## 2016-07-05 LAB — PROTIME-INR
INR: 2.71
PROTHROMBIN TIME: 29.3 s — AB (ref 11.4–15.2)

## 2016-07-08 ENCOUNTER — Non-Acute Institutional Stay (SKILLED_NURSING_FACILITY): Payer: Medicare Other | Admitting: Gerontology

## 2016-07-08 DIAGNOSIS — Z5181 Encounter for therapeutic drug level monitoring: Secondary | ICD-10-CM | POA: Diagnosis not present

## 2016-07-08 DIAGNOSIS — M898X9 Other specified disorders of bone, unspecified site: Secondary | ICD-10-CM | POA: Diagnosis not present

## 2016-07-08 DIAGNOSIS — R791 Abnormal coagulation profile: Secondary | ICD-10-CM

## 2016-07-08 DIAGNOSIS — G8918 Other acute postprocedural pain: Secondary | ICD-10-CM

## 2016-07-08 LAB — PROTIME-INR
INR: 4.24
PROTHROMBIN TIME: 41.9 s — AB (ref 11.4–15.2)

## 2016-07-08 NOTE — Progress Notes (Signed)
Location:      Place of Service:  SNF (31) Provider:  Toni Arthurs, NP-C  Rusty Aus, MD  Patient Care Team: Rusty Aus, MD as PCP - General (Internal Medicine) Robert Bellow, MD as Consulting Physician (General Surgery)  Extended Emergency Contact Information Primary Emergency Contact: [name],[name] Address: [address]          [place], [place] [address] [name] of [place] Phone: [phone] Relation: Daughter Secondary Emergency Contact: [name] [place] of [place] Mobile Phone: [phone] Relation: Son  Code Status:  DNR Goals of care: Advanced Directive information Advanced Directives 06/13/2016  Does Patient Have a Medical Advance Directive? No  Would patient like information on creating a medical advance directive? No - Patient declined     Chief Complaint  Patient presents with  . Acute Visit  . Medication Management    HPI:  Pt is a 82 y.o. female seen today for management of Coumadin (Warfarin) for VTE Prophylaxis in the setting of afib and a mechanical heart valve. Pt has been complaint with medication regimen and is aware of dietary modifications needed. Pt is aware of importance of continued compliance with medication and testing. Pt has not displayed any adverse effects related to anticoagulant therapy such as unexplained or excessive bleeding, bruising, hematuria, hematemesis, melena. Heart rate is controlled. INR is supratherapeutic today at 4.2. No vitamin K given. Will hold meds today and likely tomorrow. Recheck INR closely. Pt reports she is having minimal pain now. She appears relaxed and is smiling. Will start to wean meds. Pt is concerned about a sharp, bony prominence in the suprasternal region. It is ~4 cm superior to the mid-sternal incision from the heart valve replacement surgery. Pt reports the prominence is inhibiting her swallowing and is tender. This is causing her concern. Vital signs are stable.    Past  Medical History:  Diagnosis Date  . Arthritis    "back, hands, knees" (04/10/2016)  . Chronic atrial fibrillation ([place])    a. On Coumadin  . Chronic back pain   . Chronic combined systolic (congestive) and diastolic (congestive) heart failure    a. EF 25% by echo in 08/2015 b. RHC in 08/2015 showed normal filling pressures  . Compression fracture    "several; all in my back" (04/10/2016)  . GERD (gastroesophageal reflux disease)   . Heart murmur   . Hypertension   . S/P MVR (mitral valve replacement)    a. MVR 1994 b. redo MVR in 08/2014 - on Coumadin  . Shortness of breath dyspnea    Past Surgical History:  Procedure Laterality Date  . BREAST LUMPECTOMY Right 2005?   benign lump excision  . CARDIAC CATHETERIZATION    . [place], 2016   "MVR; MVR"  . CATARACT EXTRACTION W/PHACO Right 02/20/2015   Procedure: CATARACT EXTRACTION PHACO AND INTRAOCULAR LENS PLACEMENT (IOC);  Surgeon: Estill Cotta, MD;  Location: [facility] ORS;  Service: Ophthalmology;  Laterality: Right;  US01:31.2 AP 25.3% CDE40.13 Fluid lot # [id number]  . ELECTROPHYSIOLOGIC STUDY N/A 10/09/2015   Procedure: CARDIOVERSION;  Surgeon: Wellington Hampshire, MD;  Location: [facility] OR[facility];  Service: Cardiovascular;  Laterality: N/A;  . IR GENERIC HISTORICAL  04/01/2016   IR RADIOLOGIST EVAL & MGMT 04/01/2016 [facility]  . IR GENERIC HISTORICAL  04/15/2016   IR VERTEBROPLASTY CERV/THOR BX INC UNI/BIL INC/INJECT/IMAGING 04/15/2016 Luanne Bras, MD [facility]  . IR GENERIC HISTORICAL  04/15/2016   IR VERTEBROPLASTY EA ADDL (T&LS)  BX INC UNI/BIL INC INJECT/IMAGING 04/15/2016 Luanne Bras, MD [facility]  . IR GENERIC HISTORICAL  04/15/2016   IR VERTEBROPLASTY EA ADDL (T&LS) BX INC UNI/BIL INC INJECT/IMAGING 04/15/2016 Luanne Bras, MD [facility]  . IR GENERIC HISTORICAL  05/20/2016   IR RADIOLOGIST EVAL & MGMT 05/20/2016 [facility]  . IR GENERIC HISTORICAL  06/13/2016   IR  VERTEBROPLASTY EA ADDL (T&LS) BX INC UNI/BIL INC INJECT/IMAGING 06/13/2016 Luanne Bras, MD [facility]  . IR GENERIC HISTORICAL  06/13/2016   IR SACROPLASTY BILATERAL 06/13/2016 Luanne Bras, MD [facility]  . IR GENERIC HISTORICAL  06/13/2016   IR VERTEBROPLASTY LUMBAR BX INC UNI/BIL INC/INJECT/IMAGING 06/13/2016 Luanne Bras, MD [facility]  . IRRIGATION AND DEBRIDEMENT HEMATOMA Left 07/05/2015   Procedure: IRRIGATION AND DEBRIDEMENT HEMATOMA;  Surgeon: Robert Bellow, MD;  Location: [facility];  Service: General;  Laterality: Left;  . pyloric stenosis  07/1937  . US ECHOCARDIOGRAPHY      Allergies  Allergen Reactions  . Ace Inhibitors     Other reaction(s): Cough  . Hydrocodone Itching and Nausea Only  . Mercury     Other reaction(s): Unknown  . Nickel   . Silver Dermatitis    Severe itching  . Cephalexin Rash  . Clindamycin/Lincomycin Rash  . Penicillins Rash    Has patient had a PCN reaction causing immediate rash, facial/tongue/throat swelling, SOB or lightheadedness with hypotension: YES Has patient had a PCN reaction causing severe rash involving mucus membranes or skin necrosis: NO Has patient had a PCN reaction that required hospitalization NO Has patient had a PCN reaction occurring within the last 10 years: NO If all of the above answers are "NO", then may proceed with Cephalosporin use.    Allergies as of 07/08/2016      Reactions   Ace Inhibitors    Other reaction(s): Cough   Hydrocodone Itching, Nausea Only   Mercury    Other reaction(s): Unknown   Nickel    Silver Dermatitis   Severe itching   Cephalexin Rash   Clindamycin/lincomycin Rash   Penicillins Rash   Has patient had a PCN reaction causing immediate rash, facial/tongue/throat swelling, SOB or lightheadedness with hypotension: YES Has patient had a PCN reaction causing severe rash involving mucus membranes or skin necrosis: NO Has patient had a PCN reaction that required  hospitalization NO Has patient had a PCN reaction occurring within the last 10 years: NO If all of the above answers are "NO", then may proceed with Cephalosporin use.      Medication List       Accurate as of 07/08/16  9:22 PM. Always use your most recent med list.          acetaminophen 500 MG tablet Commonly known as:  TYLENOL Take 500 mg by mouth every 6 (six) hours as needed for mild pain, moderate pain, fever or headache.   acetaminophen 325 MG tablet Commonly known as:  TYLENOL Take 650 mg by mouth 4 (four) times daily.   aspirin EC 81 MG tablet Take 81 mg by mouth daily.   calcium carbonate 500 MG chewable tablet Commonly known as:  TUMS - dosed in mg elemental calcium Chew 1 tablet by mouth every 4 (four) hours as needed for indigestion or heartburn.   diclofenac 1.3 % Ptch Commonly known as:  FLECTOR Place 1 patch onto the skin 2 (two) times daily. To the greatest pain in lower back   enoxaparin 40 MG/0.4ML injection Commonly known as:  LOVENOX  Inject 40 mg into the skin daily.   feeding supplement (ENSURE ENLIVE) Liqd Take 237 mLs by mouth 2 (two) times daily between meals.   furosemide 40 MG tablet Commonly known as:  LASIX Take 40 mg by mouth 2 (two) times daily.   HM VITAMIN D3 4000 units Caps Generic drug:  Cholecalciferol Take 1 capsule by mouth daily.   KLOR-CON 10 10 MEQ tablet Generic drug:  potassium chloride Take 2 capsules by mouth daily.   liver oil-zinc oxide 40 % ointment Commonly known as:  DESITIN Apply 1 application topically as needed (bed sores).   methocarbamol 500 MG tablet Commonly known as:  ROBAXIN Take 250 mg by mouth 4 (four) times daily.   metoprolol succinate 25 MG 24 hr tablet Commonly known as:  TOPROL-XL Take 1 tablet (25 mg total) by mouth daily.   mirtazapine 15 MG tablet Commonly known as:  REMERON Take 15 mg by mouth at bedtime.   oxyCODONE 5 MG immediate release tablet Commonly known as:  Oxy  IR/ROXICODONE Take 5 mg by mouth every 4 (four) hours as needed for moderate pain, severe pain or breakthrough pain.   oxyCODONE 5 MG immediate release tablet Commonly known as:  Oxy IR/ROXICODONE Take 1 tablet (5 mg total) by mouth every 4 (four) hours.   pantoprazole 40 MG tablet Commonly known as:  PROTONIX Take 40 mg by mouth daily.   polyethylene glycol packet Commonly known as:  MIRALAX / GLYCOLAX Take 17 g by mouth 2 (two) times daily.   RISA-BID PROBIOTIC PO Take 1 capsule by mouth 2 (two) times daily.   senna-docusate 8.6-50 MG tablet Commonly known as:  Senokot-S Take 1 tablet by mouth 2 (two) times daily.   spironolactone 25 MG tablet Commonly known as:  ALDACTONE Take 25 mg by mouth 2 (two) times daily.   traZODone 50 MG tablet Commonly known as:  DESYREL Take 50 mg by mouth at bedtime as needed for sleep.   warfarin 5 MG tablet Commonly known as:  COUMADIN Take 1 tablet (5 mg total) by mouth daily.       Review of Systems  Constitutional: Negative for activity change, appetite change, chills, diaphoresis and fever.  Respiratory: Negative for apnea, cough, choking, chest tightness, shortness of breath and wheezing.   Cardiovascular: Negative for chest pain, palpitations and leg swelling.  Gastrointestinal: Negative for abdominal distention, abdominal pain, anal bleeding, blood in stool, constipation, diarrhea and nausea.  Genitourinary: Negative for difficulty urinating, dysuria, frequency, hematuria, urgency and vaginal bleeding.  Musculoskeletal: Positive for arthralgias (typical arthritis), back pain and myalgias. Negative for gait problem.  Skin: Positive for wound. Negative for color change, pallor and rash.  Neurological: Negative for dizziness, tremors, syncope, speech difficulty, weakness, numbness and headaches.  All other systems reviewed and are negative.    There is no immunization history on file for this patient. Pertinent  Health  Maintenance Due  Topic Date Due  . DEXA SCAN  06/28/2002  . PNA vac Low Risk Adult (1 of 2 - PCV13) 06/28/2002  . INFLUENZA VACCINE  01/30/2016   Fall Risk  09/25/2015  Falls in the past year? No   Functional Status Survey:    Vitals:   07/08/16 0430  BP: 136/64  Pulse: 73  Resp: 18  Temp: 97.7 F (36.5 C)  SpO2: 91%  Weight: 150 lb (68 kg)   Body mass index is 22.48 kg/m. Physical Exam  Constitutional: She is oriented to person, place, and time. Vital  signs are normal. She appears well-developed and well-nourished. She is active and cooperative. She does not appear ill. No distress.  HENT:  Head: Normocephalic and atraumatic.  Mouth/Throat: Uvula is midline, oropharynx is clear and moist and mucous membranes are normal. Mucous membranes are not pale, not dry and not cyanotic.  Eyes: Conjunctivae, EOM and lids are normal. Pupils are equal, round, and reactive to light.  Neck: Trachea normal, normal range of motion and full passive range of motion without pain. Neck supple. No JVD present. No tracheal deviation, no edema and no erythema present. No thyromegaly present.  Cardiovascular: Normal rate, regular rhythm, normal heart sounds, intact distal pulses and normal pulses.  Exam reveals no gallop, no distant heart sounds and no friction rub.   No murmur heard. Pulmonary/Chest: Effort normal and breath sounds normal. No accessory muscle usage. No respiratory distress. She has no wheezes. She has no rales. She exhibits no tenderness.  Abdominal: Normal appearance and bowel sounds are normal. She exhibits no distension and no ascites. There is no tenderness.  Musculoskeletal: She exhibits no edema.       Lumbar back: She exhibits decreased range of motion (multiple compression fractures), tenderness, bony tenderness and pain.  Expected osteoarthritis, stiffness  Neurological: She is alert and oriented to person, place, and time. She has normal strength.  Skin: Skin is warm, dry and  intact. Lesion (small, 2 cm diameter vascular wound with eschar on the posterior right lower leg with small 1 cm diameter satellite lesion proximal. No drainage or erythema) noted. No rash noted. She is not diaphoretic. No cyanosis or erythema. No pallor. Nails show no clubbing.  Psychiatric: She has a normal mood and affect. Her speech is normal and behavior is normal. Judgment and thought content normal. Cognition and memory are normal.  Nursing note and vitals reviewed.   Labs reviewed:  Recent Labs  06/27/16 0700 06/29/16 0515 07/01/16 0450  NA 136 138 137  K 4.7 5.0 5.2*  CL 101 102 101  CO2 27 28 27   GLUCOSE 82 91 87  BUN 31* 33* 38*  CREATININE 1.51* 1.39* 1.59*  CALCIUM 10.2 10.5* 11.0*    Recent Labs  05/20/16 0435 05/21/16 1625 06/01/16 0500  AST 24 25 18   ALT 18 19 16   ALKPHOS 122 151* 129*  BILITOT 0.9 0.6 1.0  PROT 7.0 7.5 6.7  ALBUMIN 3.9 4.3 3.5    Recent Labs  06/27/16 0700 06/29/16 0515 07/01/16 0450  WBC 3.3* 3.2* 3.7  NEUTROABS 1.7 1.4 1.9  HGB 11.2* 11.4* 11.7*  HCT 34.2* 34.5* 35.5  MCV 83.1 83.0 82.3  PLT 205 211 220   Lab Results  Component Value Date   TSH 2.239 04/13/2016   No results found for: HGBA1C No results found for: CHOL, HDL, LDLCALC, LDLDIRECT, TRIG, CHOLHDL  Significant Diagnostic Results in last 30 days:  Ir Vertebroplasty Lumbar Bx Inc Uni/bil Inc Inject/imaging  Result Date: 06/14/2016 INDICATION: Severe lumbosacral and sacral pain due to compression fractures at L2, L4 and sacral insufficiency fractures. EXAM: IR VERTEBROPLASTY LUMBOSACRAL INJ; IR VERTEBROPLASTY ADDL INJECTION; IR SACRALPLASTY INJ BILAT<Procedure Description>IR VERTEBROPLASTY LUMBOSACRAL INJ; IR VERTEBROPLASTY ADDL INJECTION; IR SACRALPLASTY INJ BILAT MEDICATIONS: As antibiotic prophylaxis, vancomycin 1 g IV was ordered pre-procedure and administered intravenously within 1 hour of incision. ANESTHESIA/SEDATION: Moderate (conscious) sedation was employed  during this procedure. A total of Versed 1.5 mg and Fentanyl 100 mcg was administered intravenously. Moderate Sedation Time: 45 minutes. The patient's level of consciousness and vital  signs were monitored continuously by radiology nursing throughout the procedure under my direct supervision. FLUOROSCOPY TIME:  Fluoroscopy Time: 8 minutes 42 seconds (1740 mGy) COMPLICATIONS: None immediate. TECHNIQUE: Informed written consent was obtained from the patient after a thorough discussion of the procedural risks, benefits and alternatives. All questions were addressed. Maximal Sterile Barrier Technique was utilized including caps, mask, sterile gowns, sterile gloves, sterile drape, hand hygiene and skin antiseptic. A timeout was performed prior to the initiation of the procedure. Prior to the procedure, extensive discussions are were held with the patient and the patient's daughter in regards to the levels being treated. Questions were answered to their satisfaction. The plan was to do vertebral body augmentations at L2, L4 and S1. PROCEDURE: The patient was placed prone on the fluoroscopic table. Nasal oxygen was administered. Physiologic monitoring was performed throughout the duration of the procedure. The skin overlying the lumbosacral region was prepped and draped in the usual sterile fashion. The L2 and L4 vertebral bodies were identified in the right pedicle. Both L2 and L4 were infiltrated with 0.25% Bupivacaine. This was then followed by the advancement of a 13-gauge Cook needle through the right pedicle at L2 and L4 pedicle into the anterior one-third at both levels. A gentle contrast injection demonstrated a trabecular pattern of contrast, with early opacification of an anterior paraspinous vein at the level of L4. The skin entry sites for the sacroplasty were also infiltrated with 0.25% bupivacaine followed by the advancement of 13 gauge Cook spinal needles into the anterior one-third at L1 bilaterally. A  gentle contrast injection demonstrated early presacral veins which necessitated the use of Gel-Foam pledgets through the 13 gauge Cook spinal needles prior to the delivery of the methylmethacrylate mixture. At this time, methylmethacrylate mixture was reconstituted. Under biplane intermittent fluoroscopy, the methylmethacrylate was then injected into the L2 and L4 vertebral bodies with filling of the vertebral bodies. Also the methylmethacrylate mixture was injected at S1 under biplane intermittent fluoroscopy. Excellent filling was obtained at the levels treated. No extravasation was noted into the disk spaces or posteriorly into the spinal canal. No epidural venous contamination was seen. The needles were then removed. Hemostasis was achieved at the skin entry sites. There were no acute complications. The patient tolerated the procedure well. The patient was observed for 3 hours and discharged in good condition. IMPRESSION: 1. Status post vertebral body augmentation for painful compression fracture at L2, L4 and S1 using vertebroplasty technique. Following the procedure, the patient did complain of low back pain. She and her daughter were informed to continue with her previous pain medications as prescribed for the pain as needed. Instructions were provided to the daughter regarding post procedural care. Questions were answered to their satisfaction. They were asked to call should they have any concerns or questions. Electronically Signed   By: Luanne Bras M.D.   On: 06/13/2016 15:26   Ir Vertebroplasty Ea Addl (t&ls) Bx Inc Uni/bil Inc Inject/imaging  Result Date: 06/14/2016 INDICATION: Severe lumbosacral and sacral pain due to compression fractures at L2, L4 and sacral insufficiency fractures. EXAM: IR VERTEBROPLASTY LUMBOSACRAL INJ; IR VERTEBROPLASTY ADDL INJECTION; IR SACRALPLASTY INJ BILAT<Procedure Description>IR VERTEBROPLASTY LUMBOSACRAL INJ; IR VERTEBROPLASTY ADDL INJECTION; IR SACRALPLASTY  INJ BILAT MEDICATIONS: As antibiotic prophylaxis, vancomycin 1 g IV was ordered pre-procedure and administered intravenously within 1 hour of incision. ANESTHESIA/SEDATION: Moderate (conscious) sedation was employed during this procedure. A total of Versed 1.5 mg and Fentanyl 100 mcg was administered intravenously. Moderate Sedation Time: 45 minutes. The  patient's level of consciousness and vital signs were monitored continuously by radiology nursing throughout the procedure under my direct supervision. FLUOROSCOPY TIME:  Fluoroscopy Time: 8 minutes 42 seconds (2831 mGy) COMPLICATIONS: None immediate. TECHNIQUE: Informed written consent was obtained from the patient after a thorough discussion of the procedural risks, benefits and alternatives. All questions were addressed. Maximal Sterile Barrier Technique was utilized including caps, mask, sterile gowns, sterile gloves, sterile drape, hand hygiene and skin antiseptic. A timeout was performed prior to the initiation of the procedure. Prior to the procedure, extensive discussions are were held with the patient and the patient's daughter in regards to the levels being treated. Questions were answered to their satisfaction. The plan was to do vertebral body augmentations at L2, L4 and S1. PROCEDURE: The patient was placed prone on the fluoroscopic table. Nasal oxygen was administered. Physiologic monitoring was performed throughout the duration of the procedure. The skin overlying the lumbosacral region was prepped and draped in the usual sterile fashion. The L2 and L4 vertebral bodies were identified in the right pedicle. Both L2 and L4 were infiltrated with 0.25% Bupivacaine. This was then followed by the advancement of a 13-gauge Cook needle through the right pedicle at L2 and L4 pedicle into the anterior one-third at both levels. A gentle contrast injection demonstrated a trabecular pattern of contrast, with early opacification of an anterior paraspinous vein at  the level of L4. The skin entry sites for the sacroplasty were also infiltrated with 0.25% bupivacaine followed by the advancement of 13 gauge Cook spinal needles into the anterior one-third at L1 bilaterally. A gentle contrast injection demonstrated early presacral veins which necessitated the use of Gel-Foam pledgets through the 13 gauge Cook spinal needles prior to the delivery of the methylmethacrylate mixture. At this time, methylmethacrylate mixture was reconstituted. Under biplane intermittent fluoroscopy, the methylmethacrylate was then injected into the L2 and L4 vertebral bodies with filling of the vertebral bodies. Also the methylmethacrylate mixture was injected at S1 under biplane intermittent fluoroscopy. Excellent filling was obtained at the levels treated. No extravasation was noted into the disk spaces or posteriorly into the spinal canal. No epidural venous contamination was seen. The needles were then removed. Hemostasis was achieved at the skin entry sites. There were no acute complications. The patient tolerated the procedure well. The patient was observed for 3 hours and discharged in good condition. IMPRESSION: 1. Status post vertebral body augmentation for painful compression fracture at L2, L4 and S1 using vertebroplasty technique. Following the procedure, the patient did complain of low back pain. She and her daughter were informed to continue with her previous pain medications as prescribed for the pain as needed. Instructions were provided to the daughter regarding post procedural care. Questions were answered to their satisfaction. They were asked to call should they have any concerns or questions. Electronically Signed   By: Luanne Bras M.D.   On: 06/13/2016 15:26   Ir Sacroplasty Bilateral  Result Date: 06/14/2016 INDICATION: Severe lumbosacral and sacral pain due to compression fractures at L2, L4 and sacral insufficiency fractures. EXAM: IR VERTEBROPLASTY LUMBOSACRAL INJ;  IR VERTEBROPLASTY ADDL INJECTION; IR SACRALPLASTY INJ BILAT<Procedure Description>IR VERTEBROPLASTY LUMBOSACRAL INJ; IR VERTEBROPLASTY ADDL INJECTION; IR SACRALPLASTY INJ BILAT MEDICATIONS: As antibiotic prophylaxis, vancomycin 1 g IV was ordered pre-procedure and administered intravenously within 1 hour of incision. ANESTHESIA/SEDATION: Moderate (conscious) sedation was employed during this procedure. A total of Versed 1.5 mg and Fentanyl 100 mcg was administered intravenously. Moderate Sedation Time: 45 minutes. The patient's  level of consciousness and vital signs were monitored continuously by radiology nursing throughout the procedure under my direct supervision. FLUOROSCOPY TIME:  Fluoroscopy Time: 8 minutes 42 seconds (9604 mGy) COMPLICATIONS: None immediate. TECHNIQUE: Informed written consent was obtained from the patient after a thorough discussion of the procedural risks, benefits and alternatives. All questions were addressed. Maximal Sterile Barrier Technique was utilized including caps, mask, sterile gowns, sterile gloves, sterile drape, hand hygiene and skin antiseptic. A timeout was performed prior to the initiation of the procedure. Prior to the procedure, extensive discussions are were held with the patient and the patient's daughter in regards to the levels being treated. Questions were answered to their satisfaction. The plan was to do vertebral body augmentations at L2, L4 and S1. PROCEDURE: The patient was placed prone on the fluoroscopic table. Nasal oxygen was administered. Physiologic monitoring was performed throughout the duration of the procedure. The skin overlying the lumbosacral region was prepped and draped in the usual sterile fashion. The L2 and L4 vertebral bodies were identified in the right pedicle. Both L2 and L4 were infiltrated with 0.25% Bupivacaine. This was then followed by the advancement of a 13-gauge Cook needle through the right pedicle at L2 and L4 pedicle into the  anterior one-third at both levels. A gentle contrast injection demonstrated a trabecular pattern of contrast, with early opacification of an anterior paraspinous vein at the level of L4. The skin entry sites for the sacroplasty were also infiltrated with 0.25% bupivacaine followed by the advancement of 13 gauge Cook spinal needles into the anterior one-third at L1 bilaterally. A gentle contrast injection demonstrated early presacral veins which necessitated the use of Gel-Foam pledgets through the 13 gauge Cook spinal needles prior to the delivery of the methylmethacrylate mixture. At this time, methylmethacrylate mixture was reconstituted. Under biplane intermittent fluoroscopy, the methylmethacrylate was then injected into the L2 and L4 vertebral bodies with filling of the vertebral bodies. Also the methylmethacrylate mixture was injected at S1 under biplane intermittent fluoroscopy. Excellent filling was obtained at the levels treated. No extravasation was noted into the disk spaces or posteriorly into the spinal canal. No epidural venous contamination was seen. The needles were then removed. Hemostasis was achieved at the skin entry sites. There were no acute complications. The patient tolerated the procedure well. The patient was observed for 3 hours and discharged in good condition. IMPRESSION: 1. Status post vertebral body augmentation for painful compression fracture at L2, L4 and S1 using vertebroplasty technique. Following the procedure, the patient did complain of low back pain. She and her daughter were informed to continue with her previous pain medications as prescribed for the pain as needed. Instructions were provided to the daughter regarding post procedural care. Questions were answered to their satisfaction. They were asked to call should they have any concerns or questions. Electronically Signed   By: Luanne Bras M.D.   On: 06/13/2016 15:26    Assessment/Plan  1. Z51.81-  Encounter  for Therapeutic Drug Level Monitoring 2. Supratherapeutic INR  DC Coumadin for today and tomorrow  Coumadin 5 mg PO Q Day at 1700 for Atrial Fibrillation starting 07/10/16  Recheck INR 1 day  Monitor for s/s of bleeding, bruising, hematuria, hematemesis, melena  Check INR 3 days after initiation of antibiotic, when applicable.  3. Pain, acute postoperative  Discontinue Oxycodone 10 mg po QID scheduled  Change to Oxycodone 5 mg po QID scheduled  Continue Oxycodone 5 mg po Q 4 hours prn  Methocarbamol 500 mg  one half tablet by mouth 4 times a day scheduled  Continue Flector patch   Continue Tylenol 650 mg by mouth 4 times a day scheduled  4. Bony prominence  Xray of upper chest/neck to capture bony prominence in suprasternal area  Family/ staff Communication:   Total Time:  Documentation:  Face to Face:  Family/Phone:   Labs/tests ordered:  PT/INR in 1 day  Medication list reviewed and assessed for continued appropriateness.  Vikki Ports, NP-C Geriatrics [facility] [phone], [place] [address] Cell Phone (Mon-Fri 8am-5pm):  [phone] On Call:  [phone] & follow prompts after 5pm & weekends Office Phone:  [phone] Office Fax:  [phone]

## 2016-07-09 ENCOUNTER — Encounter: Payer: Medicare Other | Admitting: Internal Medicine

## 2016-07-09 DIAGNOSIS — I87333 Chronic venous hypertension (idiopathic) with ulcer and inflammation of bilateral lower extremity: Secondary | ICD-10-CM | POA: Diagnosis not present

## 2016-07-09 LAB — PROTIME-INR
INR: 3.63
Prothrombin Time: 37 seconds — ABNORMAL HIGH (ref 11.4–15.2)

## 2016-07-10 LAB — PROTIME-INR
INR: 2.71
Prothrombin Time: 29.3 seconds — ABNORMAL HIGH (ref 11.4–15.2)

## 2016-07-10 NOTE — Progress Notes (Signed)
[name], [name] ([id number]) Visit Report for 07/09/2016 Chief Complaint Document Details [name], [name] Date of Service: 07/09/2016 1:15 PM Patient Name: C. Patient Account Number: [id number] Medical Record Treating RN: [name] [id number] Number: Other Clinician: [name] 12, 1935 (82 y.o. Treating [name], [place] Date of Birth/Sex: Female) Physician/Extender: G Primary Care [name] Physician: Referring Physician: Melina [name] in Treatment: 32 Information Obtained from: Patient Chief Complaint Patient here for reevaluation of her right lower externally ulcer Electronic Signature(s) Signed: 07/09/2016 4:24:12 PM By: Carrie Ham MD Entered By: [name] on 07/09/2016 14:32:19 [name], [name] ([id number]) -------------------------------------------------------------------------------- Debridement Details [name] Date of Service: 07/09/2016 1:15 PM Patient Name: C. Patient Account Number: [id number] Medical Record Treating RN: [name] [id number] Number: Other Clinician: Nov 10, 1934 (82 y.o. Treating [place] Wenk, [place] Date of Birth/Sex: Female) Physician/Extender: G Primary Care [name] Physician: Referring Physician: Melina [name] in Treatment: 32 Debridement Performed for Wound #8 Right,Medial Lower Leg Assessment: Performed By: Physician Carrie Dillon, MD Debridement: Debridement Pre-procedure Yes - 13:52 Verification/Time Out Taken: Start Time: 13:53 Pain Control: Lidocaine 4% Topical Solution Level: Skin/Subcutaneous Tissue Total Area Debrided (L x 0.3 (cm) x 0.7 (cm) = 0.21 (cm) W): Tissue and other Viable, Non-Viable, Exudate, Fibrin/Slough, Subcutaneous material debrided: Instrument: Curette Bleeding: Minimum Hemostasis Achieved: Pressure End Time: 13:55 Procedural Pain: 0 Post Procedural Pain: 0 Response to Treatment: Procedure was tolerated well Post Debridement Measurements of Total  Wound Length: (cm) 0.3 Width: (cm) 0.7 Depth: (cm) 0.3 Volume: (cm) 0.049 Character of Wound/Ulcer Post Requires Further Debridement Debridement: Severity of Tissue Post Debridement: Fat layer exposed Post Procedure Diagnosis Same as Pre-procedure Electronic Signature(s) [name], [name] ([id number]) Signed: 07/09/2016 4:24:12 PM By: Carrie Ham MD Signed: 07/09/2016 4:34:43 PM By: [name] Entered By: [name] on 07/09/2016 14:27:22 [name], [name] ([id number]) -------------------------------------------------------------------------------- Debridement Details [name] Date of Service: 07/09/2016 1:15 PM Patient Name: C. Patient Account Number: [id number] Medical Record Treating RN: [name] [id number] Number: Other Clinician: 04-13-1935 (82 y.o. Treating [name], [place] Date of Birth/Sex: Female) Physician/Extender: G Primary Care [name] Physician: Referring Physician: Melina [name] in Treatment: 32 Debridement Performed for Wound #9 Left,Posterior Lower Leg Assessment: Performed By: Physician Carrie Dillon, MD Debridement: Debridement Pre-procedure Yes - 13:52 Verification/Time Out Taken: Start Time: 13:55 Pain Control: Lidocaine 4% Topical Solution Level: Skin/Subcutaneous Tissue Total Area Debrided (L x 2.6 (cm) x 1.8 (cm) = 4.68 (cm) W): Tissue and other Viable, Non-Viable, Exudate, Fibrin/Slough, Subcutaneous material debrided: Instrument: Curette Bleeding: Minimum Hemostasis Achieved: Pressure End Time: 13:58 Procedural Pain: 0 Post Procedural Pain: 0 Response to Treatment: Procedure was tolerated well Post Debridement Measurements of Total Wound Length: (cm) 2.6 Width: (cm) 1.8 Depth: (cm) 0.2 Volume: (cm) 0.735 Character of Wound/Ulcer Post Requires Further Debridement Debridement: Severity of Tissue Post Debridement: Fat layer exposed Post Procedure Diagnosis Same as  Pre-procedure Electronic Signature(s) [name], [name] ([id number]) Signed: 07/09/2016 4:24:12 PM By: Carrie Ham MD Signed: 07/09/2016 4:34:43 PM By: [name] Entered By: [name] on 07/09/2016 14:31:58 [name], [name] ([id number]) -------------------------------------------------------------------------------- HPI Details [name] Date of Service: 07/09/2016 1:15 PM Patient Name: C. Patient Account Number: [id number] Medical Record Treating RN: [name] [id number] Number: Other Clinician: 03/21/1935 (82 y.o. Treating [name], [place] Date of Birth/Sex: Female) Physician/Extender: G [name] Physician: Referring Physician: Melina [name] in Treatment: 32 History of Present Illness HPI Description: 11/22/15; this is Bacchi is a 82 year old woman who lives at home on her own. According to the patient and  her daughter was present she has had long-standing edema in her legs dating back many years. She also has a history of chronic systolic heart failure, atrial fibrillation and is status post mitral valve replacement. Last echocardiogram I see in [facility] showed an ejection fraction of 40-45% she is on Lasix 60 mg a day and spironolactone 25 mg a day. Her current problem began in December around Christmas time she developed a small hematoma in the medial part of her left leg which rapidly expanded to a very large hematoma that required surgical debridement. This situation was complicated by the fact that the patient is on long-standing Coumadin for mechanical heart valve. She went to the OR had this evacuated on January 4 /17. The wound has gradually improved however she has developed a small wounds around this area and more recently a wound on the right lateral leg. She is weeping edema fluid. The patient is already been to see vascular surgery. It was recommended that she wear Unna boots, she did not tolerate this due to  pain in the left leg. She was then prescribed Juzo stockings and really can't get these on herself although truthfully there is probably too much edema for a Juzo stockings currently. She is not a diabetic and has no history of PAD or claudication that I could elicit. She does not use the external compression pumps reliably. She comes today with notes from her primary physician and Dr. Ronalee Belts both recommending various forms of compression but the patient does not really complied with them. Has been using the external compression pumps with some regularity but certainly not daily on the right leg and this has helped. I also note that her daughter tells me the history that she does not sleep in bed at home. She has a hospital bed but with her legs up she finds this painful so she sleeps in the couch sitting up with her legs dependent. 11/29/15; the patient is arrives today accompanied by her son. He expresses satisfaction that she is maintained the compression all week. 12/06/15; the patient has kept her Profore light wraps on, we have good edema control no major change in the wounds we have been using Aquacel 12/13/15; changed to RTD last week. One of the 3 wounds on her left medial leg is healed she has 2 remaining wounds here and one on the right lateral leg. 12/18/2015 -- the patient was at Dr. Nino Parsley office today and he was seeing her for an arterial study. While the wrap was being removed she had an inadvertent laceration of the left proximal anterior leg which bled quite profusely and a compression dressing was applied over this and the patient was here to get her Profore wraps done. I was asked to see the patient to make an assessment and treat appropriately. 12/26/15; the patient's injury on the left proximal leg and Steri-Strips removed after soaking. There is an open area here. The original wounds to still open on the right lateral and left medial leg. 01/03/16 patient's injury on her  proximal left leg looks quite good. Still small open area on the medial left leg which appears to be improving. The area on the right lateral leg still substantially open with no real improvement in wound depth. Her edema control is marginal with a Profore light. We have been using RTD Montijo, Aliviya C. ([id number]) for 3-4 weeks without any major change 01/10/16: wounds without s/s of infection. vascular results are pending regarding arterial studies. 01/17/16; patient  comes in today complaining of severe pain however I think most of this is in the right hip not related to her wounds. She continues with a oval-shaped wound on the right lateral leg, trauma to the left anterior leg just below her tibial plateau. She has a smaller eschar on the left anterior leg. She is being using Prisma however she informs [place] today that she is actually allergic to silver, nose this from a previous application at [facility] some years ago 01/24/16; edema is not so well controlled today, I think I reduced her to [place] bilaterally last week. The area which was a scissor injury on her left upper anterior lower leg is fully epithelialized. She only has a small open area remaining on the medial aspect of her left leg. The oval-shaped wound on the right mid lateral leg may be a bit smaller. Debrided of surface slough nonviable subcutaneous tissue. I had changed to collagen 2 weeks ago in an attempt to get this to close 01/31/16 all the patient's wounds on her left leg give healed. We have good edema control with bilateral Profore lites which she has been compliant with. She still has the oval-shaped wound on the right lateral calf allergic even this appears to be slowly improving. The patient has juxtalite stockings at home. She states she thinks she can put these on. She also has external compression pumps at home although I think her compliance with this has not been good in the past. She complains today of edema in  her thighs. Tells me she takes 40 mg of Lasix daily 02/21/16; only 1 small wound remains on the lateral aspect of the right calf. She is using Juzo stockings on the left leg although she complains about difficulty in applying them. She has external compression pumps at home 02/28/16; the small open wound on her right lateral calf is improved in terms of wound area. It appears that she has a wrap injury on the anterior aspect of the upper leg 03/06/16; the small open wound on her right lateral calf has a very small open area remaining. The wrap injury on the anterior aspect of the upper leg also appears better. She arrives today in clinic with a history of dyspnea with minimal exertion starting with the last 2-4 days. She does not describe chest pain. Her son and our intake nurse think she has facial swelling. She has a history of an artificial mitral valve on Coumadin 03/13/16; the small wound on the right lateral calf is no better this week. The superior wrap injury anteriorly requires debridement of surface eschar and nonviable subcutaneous tissue. She has been to her primary doctor with regards to her dyspnea we identified last week. Per the patient's description her Lasix has been increased 03/21/2016 -- patient of Dr. Dellia Nims who could not keep her appointment yesterday but has come in today with the right lower leg looking good and this is the leg which has been treated in the recent past. However, her left lower extremity is extremely swollen, tender and edematous with redness and discoloration. 03/27/16; there is only 2 small areas remain on the right leg. The edema that was so concerning last week has come down however there is extensive bruising on the lateral left leg medial left foot suggesting that she lost some blood into the leg itself. I checked her hemoglobin on 9/21 was 14.5. Her INR was over 6. Duplex ultrasound was [name] for DVT 04/03/16 at this point in time today  patient is  actually doing substantially better in regard to the wound on the anterior right lower extremity. currently there is no slough or eschar noted and no evidence of erythema, discharge, or local infection. She is exhibiting no signs of systemic infection. She is tolerating the dressing changes as well as the wrapping at this point in time. 04/24/16; I have not seen Mrs. Vonseggern for 3 weeks. Apparently she was admitted to hospital for respiratory issues/also apparently has had multiple compression fractures and had kyphoplasty. When she was last here she only had one small open area remaining on the left leg she was using juxtalite stockings. Her son is upset about restarting the Coumadin which she blames or the swelling in her legs. She is on Coumadin for prophylaxis with a chronic artificial heart valve. RUTHIA, PERSON ([id number]) 04/30/16 at this point in time patient has been tolerating the 3 layer compression wrap as well as the calcium alginate. 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 With tthat being said she does have the continued wound of the left lower extremity as well as the right medial lower extremity. The right side is significantly smaller compared to the left but actually is slough covered. fortunately she has no significant tenderness at rest although with manipulation of the right location of the wound this is significantly tender. 05/07/16 for follow-up evaluation today both the patient's wounds appear to be significantly improved in size compared to last week. She is also having less pain at both locations currently. Her pain level at most is related to be a 1-2 out of 10. She does have some discharge but fortunately no evidence of infection at this point.she also continues to tolerate the compression wraps well. 05/14/16; the patient's wound on the left leg actually looks as though it's on its way to closure. She is using collagen to this area. She has a small  punched out area over the right medial calf. The cause of this is not really clear it has probably 0.4 cm of depth. She has an IV in her right hand which she states is for IV fluid when she develops low sodiums her potassiums, the etiology of this is not clear 05-21-16 she presents today with continued ulcerations the right lower extremity, posterior aspect. she has been using Iodoflex and compression therapy to the area. She denies any new injuries or trauma. She does have 2 areas proximal to this ulceration of dry crust along with an area to her left posterior lower leg that is purple discolored area appears similar to how her current ulceration started, she denies any trauma or injury to the left posterior leg we will monitor all of these areas. 05/28/16; the patient is down to 1 small open area on the right medial mid calf. This is however larger and in 50% of the surface area deeper approximately 0.4 cm. The reason for this deterioration is unclear. I've gone ahead and done a culture of this area she also tells [place] today that she is short of breath. She is also noted a swelling on her lower left eyelid 06/05/16; the patient is 1 small open area on the right medial mid calf. This looks about the same as last week. The deep area, medial 50% of the wound looks about the same. [name]. The entire area looks about the same 06/12/16; not much change in this over the course of the last week. Patient comes in today complaining of  extreme back pain. Apparently she has either a kyphoplasty or vertebral plasty scheduled at [facility] radiology tomorrow. For this reason no debridement today. We have been using Iodoflex 06/19/16; patient arrived with the surface eschar from last week removed with a curet debrided of subcutaneous tissue. She tolerates this reasonably well. She is still in a skilled facility. States her vertebral plasty last week is made her pain  bearable 06/26/16; patient arrived with a new wound on the posterior aspect of her left calf. She notices 3 or 4 days ago but is not certain how this happened. She states she simply felt a stinging sensation and one day on her foot and then the next day on the back of her calf. 06/28/2016 -- Ms. Reubin Milan called urgently saying that the pain in her left foot was drastically worse over the last several hours and though she removed her compression wrap she wanted to be seen before the long holiday. 07/03/16; she came in to see Dr. Con Memos on 12/29 for pain in her left foot/heel. Nothing much was found at the time and she states that the pain is better but involved her whole foot. She has a wound on the lateral aspect of the left calf and the medial aspect of the right. The medial 1 looks as if it is just about healed however the left one still has considerable edema. 07/09/16; she arrives in clinic today with a dime-sized wound on the left posterior calf and a small linear area on the right medial calf. The area on the left was debrided with a #3 curet nonviable subcutaneous tissue, we are not making progress here. The area on the right was also debrided of surface eschar and nonviable subcutaneous tissue this has some depth to it. Electronic Signature(s) Signed: 07/09/2016 4:24:12 PM By: Carrie Ham MD Entered By: [name] on 07/09/2016 14:33:50 Hoadley, [name] ([id number]) MECHELE, [name] ([id number]) -------------------------------------------------------------------------------- Physical Exam Details [name] Date of Service: 07/09/2016 1:15 PM Patient Name: C. Patient Account Number: [id number] Medical Record Treating RN: [name] [id number] Number: Other Clinician: 1934-10-11 (82 y.o. Treating Salam Micucci Date of Birth/Sex: Female) Physician/Extender: G Primary Care [name] Physician: Referring Physician: Melina [name] in Treatment:  32 Constitutional Sitting or standing Blood Pressure is within target range for patient.. Pulse regular and within target range for patient.Marland Kitchen Respirations regular, non-labored and within target range.. Temperature is normal and within the target range for the patient.. Patient's appearance is neat and clean. Appears in no acute distress. Well nourished and well developed.. Cardiovascular Pedal pulses palpable and strong bilaterally.. Notes Wound exam; the area over the left lower leg requires debridement again of non-viable subcutaneous necrotic looking tissue. The area on the right leg also requires debridement unfortunately has more depth today. Electronic Signature(s) Signed: 07/09/2016 4:24:12 PM By: Carrie Ham MD Entered By: [name] on 07/09/2016 14:35:11 Styles, [name] ([id number]) -------------------------------------------------------------------------------- Physician Orders Details [name] Date of Service: 07/09/2016 1:15 PM Patient Name: C. Patient Account Number: [id number] Medical Record Treating RN: [name] [id number] Number: Other Clinician: 1934/07/28 (82 y.o. Treating Hibba Schram, [place] Date of Birth/Sex: Female) Physician/Extender: G Primary Care [name] Physician: Referring Physician: Melina [name] in Treatment: 23 Verbal / Phone Orders: Yes Clinician: Carolyne Fiscal, Debi Read Back and Verified: Yes Diagnosis Coding Wound Cleansing Wound #8 Right,Medial Lower Leg o Clean wound with Normal Saline. o Cleanse wound with mild soap and water o No tub bath. - give sink or bed baths Wound #9  Left,Posterior Lower Leg o Clean wound with Normal Saline. o Cleanse wound with mild soap and water o No tub bath. - give sink or bed baths Anesthetic Wound #8 Right,Medial Lower Leg o Topical Lidocaine 4% cream applied to wound bed prior to debridement - for clinic use Wound #9 Left,Posterior Lower Leg o Topical  Lidocaine 4% cream applied to wound bed prior to debridement - for clinic use Primary Wound Dressing Wound #8 Right,Medial Lower Leg o Iodoflex Wound #9 Left,Posterior Lower Leg o Iodoflex Secondary Dressing Wound #8 Right,Medial Lower Leg o ABD pad o Dry Gauze Wound #9 Left,Posterior Lower Leg o ABD pad o Dry Gauze [place], Constanza C. ([id number]) Dressing Change Frequency Wound #8 Right,Medial Lower Leg o Three times weekly - SNF change it Mondays and Fridays and pt is has it changed at wound care clinic on Wednesdays. Wound #9 Left,Posterior Lower Leg o Three times weekly - SNF change it Mondays and Fridays and pt is has it changed at wound care clinic on Wednesdays. Follow-up Appointments Wound #8 Right,Medial Lower Leg o Return Appointment in 1 week. Wound #9 Left,Posterior Lower Leg o Return Appointment in 1 week. Edema Control Wound #8 Right,Medial Lower Leg o Kerlix and Coban - Bilateral Wound #9 Left,Posterior Lower Leg o Kerlix and Coban - Bilateral Additional Orders / Instructions Wound #8 Right,Medial Lower Leg o Increase protein intake. Wound #9 Left,Posterior Lower Leg o Increase protein intake. Electronic Signature(s) Signed: 07/09/2016 4:24:12 PM By: Carrie Ham MD Signed: 07/09/2016 4:34:43 PM By: [name] Entered By: [name] on 07/09/2016 14:14:01 Emigh, [name] ([id number]) -------------------------------------------------------------------------------- Problem List Details [name] Date of Service: 07/09/2016 1:15 PM Patient Name: C. Patient Account Number: [id number] Medical Record Treating RN: [name] [id number] Number: Other Clinician: [birth year] (82 y.o. Treating Mylinda Brook, [place] Date of Birth/Sex: Female) Physician/Extender: G Primary Care [name] Physician: Referring Physician: Melina [name] in Treatment: 32 Active Problems ICD-10 Encounter Code Description  Active Date Diagnosis L97.812 Non-pressure chronic ulcer of other part of right lower leg 04/30/2016 Yes with fat layer exposed I87.333 Chronic venous hypertension (idiopathic) with ulcer and 04/03/2016 Yes inflammation of bilateral lower extremity I89.0 Lymphedema, not elsewhere classified 04/03/2016 Yes Y80.16 Chronic systolic (congestive) heart failure 04/03/2016 Yes L97.221 Non-pressure chronic ulcer of left calf limited to 07/03/2016 Yes breakdown of skin Inactive Problems Resolved Problems ICD-10 Code Description Active Date Resolved Date S81.812S Laceration without foreign body, left lower leg, sequela 04/03/2016 04/03/2016 Electronic Signature(s) Signed: 07/09/2016 4:24:12 PM By: Carrie Ham MD [name] ([id number]) Entered By: [name] on 07/09/2016 14:26:54 [name] ([id number]) -------------------------------------------------------------------------------- Progress Note Details [name] Date of Service: 07/09/2016 1:15 PM Patient Name: C. Patient Account Number: [id number] Medical Record Treating RN: [name] [id number] Number: Other Clinician: 09/27/1934 (82 y.o. Treating Sukhraj Esquivias, [place] Date of Birth/Sex: Female) Physician/Extender: G Primary Care [name] Physician: Referring Physician: Melina [name] in Treatment: 32 Subjective Chief Complaint Information obtained from Patient Patient here for reevaluation of her right lower externally ulcer History of Present Illness (HPI) 11/22/15; this is Blough is a 82 year old woman who lives at home on her own. According to the patient and her daughter was present she has had long-standing edema in her legs dating back many years. She also has a history of chronic systolic heart failure, atrial fibrillation and is status post mitral valve replacement. Last echocardiogram I see in [facility] showed an ejection fraction of 40-45% she is on Lasix 60 mg a day and  spironolactone  25 mg a day. Her current problem began in December around Christmas time she developed a small hematoma in the medial part of her left leg which rapidly expanded to a very large hematoma that required surgical debridement. This situation was complicated by the fact that the patient is on long-standing Coumadin for mechanical heart valve. She went to the OR had this evacuated on January 4 /17. The wound has gradually improved however she has developed a small wounds around this area and more recently a wound on the right lateral leg. She is weeping edema fluid. The patient is already been to see vascular surgery. It was recommended that she wear Unna boots, she did not tolerate this due to pain in the left leg. She was then prescribed Juzo stockings and really can't get these on herself although truthfully there is probably too much edema for a Juzo stockings currently. She is not a diabetic and has no history of PAD or claudication that I could elicit. She does not use the external compression pumps reliably. She comes today with notes from her primary physician and Dr. Ronalee Belts both recommending various forms of compression but the patient does not really complied with them. Has been using the external compression pumps with some regularity but certainly not daily on the right leg and this has helped. I also note that her daughter tells me the history that she does not sleep in bed at home. She has a hospital bed but with her legs up she finds this painful so she sleeps in the couch sitting up with her legs dependent. 11/29/15; the patient is arrives today accompanied by her son. He expresses satisfaction that she is maintained the compression all week. 12/06/15; the patient has kept her Profore light wraps on, we have good edema control no major change in the wounds we have been using Aquacel 12/13/15; changed to RTD last week. One of the 3 wounds on her left medial leg is healed she  has 2 remaining wounds here and one on the right lateral leg. 12/18/2015 -- the patient was at Dr. Nino Parsley office today and he was seeing her for an arterial study. While the wrap was being removed she had an inadvertent laceration of the left proximal anterior leg which bled quite profusely and a compression dressing was applied over this and the patient was here to get her Profore wraps done. I was asked to see the patient to make an assessment and treat appropriately. RIKKI, TROSPER ([id number]) 12/26/15; the patient's injury on the left proximal leg and Steri-Strips removed after soaking. There is an open area here. The original wounds to still open on the right lateral and left medial leg. 01/03/16 patient's injury on her proximal left leg looks quite good. Still small open area on the medial left leg which appears to be improving. The area on the right lateral leg still substantially open with no real improvement in wound depth. Her edema control is marginal with a Profore light. We have been using RTD for 3-4 weeks without any major change 01/10/16: wounds without s/s of infection. vascular results are pending regarding arterial studies. 01/17/16; patient comes in today complaining of severe pain however I think most of this is in the right hip not related to her wounds. She continues with a oval-shaped wound on the right lateral leg, trauma to the left anterior leg just below her tibial plateau. She has a smaller eschar on the left anterior leg. 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 The area which was a scissor injury on her left upper anterior lower leg is fully epithelialized. She only has a small open area remaining on the medial aspect of her left leg. The  oval-shaped wound on the right mid lateral leg may be a bit smaller. Debrided of surface slough nonviable subcutaneous tissue. I had changed to collagen 2 weeks ago in an attempt to get this to close 01/31/16 all the patient's wounds on her left leg give healed. We have good edema control with bilateral Profore lites which she has been compliant with. She still has the oval-shaped wound on the right lateral calf allergic even this appears to be slowly improving. The patient has juxtalite stockings at home. She states she thinks she can put these on. She also has external compression pumps at home although I think her compliance with this has not been good in the past. She complains today of edema in her thighs. Tells me she takes 40 mg of Lasix daily 02/21/16; only 1 small wound remains on the lateral aspect of the right calf. She is using Juzo stockings on the left leg although she complains about difficulty in applying them. She has external compression pumps at home 02/28/16; the small open wound on her right lateral calf is improved in terms of wound area. It appears that she has a wrap injury on the anterior aspect of the upper leg 03/06/16; the small open wound on her right lateral calf has a very small open area remaining. The wrap injury on the anterior aspect of the upper leg also appears better. She arrives today in clinic with a history of dyspnea with minimal exertion starting with the last 2-4 days. She does not describe chest pain. Her son and our intake nurse think she has facial swelling. She has a history of an artificial mitral valve on Coumadin 03/13/16; the small wound on the right lateral calf is no better this week. The superior wrap injury anteriorly requires debridement of surface eschar and nonviable subcutaneous tissue. She has been to her primary doctor with regards to her dyspnea we identified last week. Per the patient's description her Lasix has been increased 03/21/2016  -- patient of Dr. Dellia Nims who could not keep her appointment yesterday but has come in today with the right lower leg looking good and this is the leg which has been treated in the recent past. However, her left lower extremity is extremely swollen, tender and edematous with redness and discoloration. 03/27/16; there is only 2 small areas remain on the right leg. The edema that was so concerning last week has come down however there is extensive bruising on the lateral left leg medial left foot suggesting that she lost some blood into the leg itself. I checked her hemoglobin on 9/21 was 14.5. Her INR was over 6. Duplex ultrasound was [name] for DVT 04/03/16 at this point in time today patient is actually doing substantially better in regard to the wound on the anterior right lower extremity. currently there is no slough or eschar noted and no evidence of erythema, discharge, or local infection. She is exhibiting no signs of systemic infection. She is tolerating the dressing changes as well as the wrapping at this point in time.  04/24/16; I have not seen Mrs. Feutz for 3 weeks. Apparently she was admitted to hospital for respiratory IMARA, [facility]. ([id number]) issues/also apparently has had multiple compression fractures and had kyphoplasty. When she was last here she only had one small open area remaining on the left leg she was using juxtalite stockings. Her son is upset about restarting the Coumadin which she blames or the swelling in her legs. She is on Coumadin for prophylaxis with a chronic artificial heart valve. 04/30/16 at this point in time patient has been tolerating the 3 layer compression wrap as well as the calcium alginate. 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 With tthat being said she does have the continued wound of the left lower extremity as well as the right medial lower extremity. The right side is significantly smaller compared to the left but actually is  slough covered. fortunately she has no significant tenderness at rest although with manipulation of the right location of the wound this is significantly tender. 05/07/16 for follow-up evaluation today both the patient's wounds appear to be significantly improved in size compared to last week. She is also having less pain at both locations currently. Her pain level at most is related to be a 1-2 out of 10. She does have some discharge but fortunately no evidence of infection at this point.she also continues to tolerate the compression wraps well. 05/14/16; the patient's wound on the left leg actually looks as though it's on its way to closure. She is using collagen to this area. She has a small punched out area over the right medial calf. The cause of this is not really clear it has probably 0.4 cm of depth. She has an IV in her right hand which she states is for IV fluid when she develops low sodiums her potassiums, the etiology of this is not clear 05-21-16 she presents today with continued ulcerations the right lower extremity, posterior aspect. she has been using Iodoflex and compression therapy to the area. She denies any new injuries or trauma. She does have 2 areas proximal to this ulceration of dry crust along with an area to her left posterior lower leg that is purple discolored area appears similar to how her current ulceration started, she denies any trauma or injury to the left posterior leg we will monitor all of these areas. 05/28/16; the patient is down to 1 small open area on the right medial mid calf. This is however larger and in 50% of the surface area deeper approximately 0.4 cm. The reason for this deterioration is unclear. I've gone ahead and done a culture of this area she also tells [place] today that she is short of breath. She is also noted a swelling on her lower left eyelid 06/05/16; the patient is 1 small open area on the right medial mid calf. This looks about the same as  last week. The deep area, medial 50% of the wound looks about the same. [name]. The entire area looks about the same 06/12/16; not much change in this over the course of the last week. Patient comes in today complaining of extreme back pain. Apparently she has either a kyphoplasty or vertebral plasty scheduled at [facility] radiology tomorrow. For this reason no debridement today. We have been using Iodoflex 06/19/16; patient arrived with the surface eschar from last week removed with a curet debrided of subcutaneous tissue. She tolerates this reasonably well. She is still in a skilled  facility. States her vertebral plasty last week is made her pain bearable 06/26/16; patient arrived with a new wound on the posterior aspect of her left calf. She notices 3 or 4 days ago but is not certain how this happened. She states she simply felt a stinging sensation and one day on her foot and then the next day on the back of her calf. 06/28/2016 -- Ms. Reubin Milan called urgently saying that the pain in her left foot was drastically worse over the last several hours and though she removed her compression wrap she wanted to be seen before the long holiday. 07/03/16; she came in to see Dr. Con Memos on 12/29 for pain in her left foot/heel. Nothing much was found at the time and she states that the pain is better but involved her whole foot. She has a wound on the lateral aspect of the left calf and the medial aspect of the right. The medial 1 looks as if it is just about healed however the left one still has considerable edema. 07/09/16; she arrives in clinic today with a dime-sized wound on the left posterior calf and a small linear area on the right medial calf. The area on the left was debrided with a #3 curet nonviable subcutaneous tissue, we are not making progress here. The area on the right was also debrided of surface eschar and nonviable subcutaneous tissue this has some depth to  it. [name], [name] ([id number]) Objective Constitutional Sitting or standing Blood Pressure is within target range for patient.. Pulse regular and within target range for patient.Marland Kitchen Respirations regular, non-labored and within target range.. Temperature is normal and within the target range for the patient.. Patient's appearance is neat and clean. Appears in no acute distress. Well nourished and well developed.. Vitals Time Taken: 1:39 PM, Height: 68 in, Weight: 178 lbs, BMI: 27.1, Temperature: 97.5 F, Pulse: 73 bpm, Respiratory Rate: 16 breaths/min, Blood Pressure: 114/58 mmHg. Cardiovascular Pedal pulses palpable and strong bilaterally.. General Notes: Wound exam; the area over the left lower leg requires debridement again of non-viable subcutaneous necrotic looking tissue. The area on the right leg also requires debridement unfortunately has more depth today. Integumentary (Hair, Skin) Wound #8 status is Open. Original cause of wound was Trauma. The wound is located on the Right,Medial Lower Leg. The wound measures 0.3cm length x 0.7cm width x 0.1cm depth; 0.165cm^2 area and 0.016cm^3 volume. There is fat exposed. There is no tunneling or undermining noted. There is a medium amount of serosanguineous drainage noted. The wound margin is distinct with the outline attached to the wound base. There is no granulation within the wound bed. There is a large (67-100%) amount of necrotic tissue within the wound bed including Eschar. The periwound skin appearance exhibited: Dry/Scaly. Periwound temperature was noted as No Abnormality. The periwound has tenderness on palpation. Wound #9 status is Open. Original cause of wound was Gradually Appeared. The wound is located on the Left,Posterior Lower Leg. The wound measures 2.6cm length x 1.8cm width x 0.2cm depth; 3.676cm^2 area and 0.735cm^3 volume. The wound is limited to skin breakdown. There is no tunneling or undermining noted. There is a  large amount of serosanguineous drainage noted. The wound margin is flat and intact. There is small (1-33%) red granulation within the wound bed. There is a large (67-100%) amount of necrotic tissue within the wound bed including Adherent Slough. The periwound skin appearance exhibited: Moist. Periwound temperature was noted as No Abnormality. The periwound has tenderness on palpation. Assessment  Active Problems [name], [name]. 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 There was a Skin/Subcutaneous Tissue Debridement ([id number]) debridement with total area of 0.21 sq cm performed by Carrie Dillon, MD. with the following instrument(s): Curette to remove Viable and Non-Viable tissue/material including Exudate, Fibrin/Slough, and Subcutaneous after achieving pain control using Lidocaine 4% Topical Solution. A time out was conducted at 13:52, prior to the start of the procedure. A Minimum amount of bleeding was controlled with Pressure. The procedure was tolerated well with a pain level of 0 throughout and a pain level of 0 following the procedure. Post Debridement Measurements: 0.3cm length x 0.7cm width x 0.3cm depth; 0.049cm^3 volume. Character of Wound/Ulcer Post Debridement requires further debridement. Severity of Tissue Post Debridement is: Fat layer exposed. Post procedure Diagnosis Wound #8: Same as Pre-Procedure Wound #9 Wound #9 is a Venous Leg Ulcer located on the Left,Posterior Lower Leg . There was a Skin/Subcutaneous Tissue  Debridement ([id number]) debridement with total area of 4.68 sq cm performed by Carrie Dillon, MD. with the following instrument(s): Curette to remove Viable and Non-Viable tissue/material including Exudate, Fibrin/Slough, and Subcutaneous after achieving pain control using Lidocaine 4% Topical Solution. A time out was conducted at 13:52, prior to the start of the procedure. A Minimum amount of bleeding was controlled with Pressure. The procedure was tolerated well with a pain level of 0 throughout and a pain level of 0 following the procedure. Post Debridement Measurements: 2.6cm length x 1.8cm width x 0.2cm depth; 0.735cm^3 volume. Character of Wound/Ulcer Post Debridement requires further debridement. Severity of Tissue Post Debridement is: Fat layer exposed. Post procedure Diagnosis Wound #9: Same as Pre-Procedure Plan Wound Cleansing: Wound #8 Right,Medial Lower Leg: [name], [name] ([id number]) Clean wound with Normal Saline. Cleanse wound with mild soap and water No tub bath. - give sink or bed baths Wound #9 Left,Posterior Lower Leg: Clean wound with Normal Saline. Cleanse wound with mild soap and water No tub bath. - give sink or bed baths Anesthetic: Wound #8 Right,Medial Lower Leg: Topical Lidocaine 4% cream applied to wound bed prior to debridement - for clinic use Wound #9 Left,Posterior Lower Leg: Topical Lidocaine 4% cream applied to wound bed prior to debridement - for clinic use Primary Wound Dressing: Wound #8 Right,Medial Lower Leg: Iodoflex Wound #9 Left,Posterior Lower Leg: Iodoflex Secondary Dressing: Wound #8 Right,Medial Lower Leg: ABD pad Dry Gauze Wound #9 Left,Posterior Lower Leg: ABD pad Dry Gauze Dressing Change Frequency: Wound #8 Right,Medial Lower Leg: Three times weekly - SNF change it Mondays and Fridays and pt is has it changed at wound care clinic on Wednesdays. Wound #9 Left,Posterior Lower Leg: Three times weekly - SNF change  it Mondays and Fridays and pt is has it changed at wound care clinic on Wednesdays. Follow-up Appointments: Wound #8 Right,Medial Lower Leg: Return Appointment in 1 week. Wound #9 Left,Posterior Lower Leg: Return Appointment in 1 week. Edema Control: Wound #8 Right,Medial Lower Leg: Kerlix and Coban - Bilateral Wound #9 Left,Posterior Lower Leg: Kerlix and Coban - Bilateral Additional Orders / Instructions: Wound #8 Right,Medial Lower Leg: Increase  protein intake. Wound #9 Left,Posterior Lower Leg: Increase protein intake. BRESHA, HOSACK ([id number]) 1change primary dressing to iodoflex to aide in debridement need to review previous arterial and venous studies Dr. Lucky Cowboy Electronic Signature(s) Signed: 07/09/2016 4:24:12 PM By: Carrie Ham MD Entered By: [name] on 07/09/2016 14:36:36 Lowell, [name] ([id number]) -------------------------------------------------------------------------------- SuperBill Details Patient Name: [name] C. Date of Service: 07/09/2016 Medical Record Patient Account Number: [id number] Number: Treating RN: [name] October 09, 1934 (81 y.o. Other Clinician: Date of Birth/Sex: Female) Treating Ramonia Mcclaran Primary Care Physician/Extender: Claudette Laws Physician: Suella Grove in Treatment: 32 Referring Physician: Emily [name] Diagnosis Coding ICD-10 Codes Code Description [phone] Non-pressure chronic ulcer of other part of right lower leg with fat layer exposed Chronic venous hypertension (idiopathic) with ulcer and inflammation of bilateral lower I87.333 extremity I89.0 Lymphedema, not elsewhere classified M14.70 Chronic systolic (congestive) heart failure L97.221 Non-pressure chronic ulcer of left calf limited to breakdown of skin Facility Procedures CPT4: Description Modifier Quantity Code [id number] - DEB SUBQ TISSUE 20 SQ CM/< 1 ICD-10 Description Diagnosis L97.812 Non-pressure chronic ulcer of other  part of right lower leg with fat layer exposed L97.221 Non-pressure chronic ulcer of left calf  limited to breakdown of skin Physician Procedures CPT4: Description Modifier Quantity Code [id number] - WC PHYS SUBQ TISS 20 SQ CM 1 ICD-10 Description Diagnosis L97.812 Non-pressure chronic ulcer of other part of right lower leg with fat layer exposed L97.221 Non-pressure chronic ulcer of left calf  limited to breakdown of skin Electronic Signature(s) Signed: 07/09/2016 4:24:12 PM By: Carrie Ham MD [name] ([id number]) Entered By: [name] on 07/09/2016 14:37:50

## 2016-07-10 NOTE — Progress Notes (Signed)
Carrie, [name] ([id number]) Visit Report for 07/09/2016 Arrival Information Details Patient Name: [name], [name]. Date of Service: 07/09/2016 1:15 PM Medical Record Patient Account Number: [id number] Number: Treating RN: [name] April 23, 1935 (82 y.o. Other Clinician: Date of Birth/Sex: Female) Treating [name], [name] Primary Care Physician/Extender: Claudette Laws Physician: Referring Physician: [name] in Treatment: 63 Visit Information History Since Last Visit All ordered tests and consults were completed: No Patient Arrived: Wheel Chair Added or deleted any medications: No Arrival Time: 13:36 Any new allergies or adverse reactions: No Accompanied By: caregiver Had a fall or experienced change in No Transfer Assistance: EasyPivot Patient activities of daily living that may affect Lift risk of falls: Patient Identification Verified: Yes Signs or symptoms of abuse/neglect since last No Secondary Verification Process Yes visito Completed: Hospitalized since last visit: No Patient Requires Transmission- No Has Dressing in Place as Prescribed: Yes Based Precautions: Has Compression in Place as Prescribed: Yes Patient Has Alerts: Yes Pain Present Now: No Patient Alerts: Patient on Blood Thinner [place]!!! Electronic Signature(s) Signed: 07/09/2016 4:34:43 PM By: [name] Entered By: [name] on 07/09/2016 13:38:04 Kawa, [name] ([id number]) -------------------------------------------------------------------------------- Encounter Discharge Information Details Patient Name: Carrie Mulligan C. Date of Service: 07/09/2016 1:15 PM Medical Record Patient Account Number: [id number] Number: Treating RN: [name] 1935/05/17 (82 y.o. Other Clinician: Date of Birth/Sex: Female) Treating [name], [name] Primary Care Physician/Extender: Claudette Laws Physician: Referring Physician: [name] in Treatment: 32 Encounter Discharge Information Items Discharge Pain Level: 0 Discharge Condition: Stable Ambulatory Status: Wheelchair Discharge Destination: Nursing Home Transportation: Other Accompanied By: caregiver Schedule Follow-up Appointment: Yes Medication Reconciliation completed Yes and provided to Patient/Care Indy Kuck: Provided on Clinical Summary of Care: 07/09/2016 Form Type Recipient Paper Patient [place] Electronic Signature(s) Signed: 07/09/2016 4:34:43 PM By: [name] Previous Signature: 07/09/2016 2:22:58 PM Version By: Ruthine Dose Entered By: [name] on 07/09/2016 15:37:46 Sylvestre, [name] ([id number]) -------------------------------------------------------------------------------- Lower Extremity Assessment Details Patient Name: [name], Nakaya C. Date of Service: 07/09/2016 1:15 PM Medical Record Patient Account Number: [id number] Number: Treating RN: [name] [birth year] (82 y.o. Other Clinician: Date of Birth/Sex: Female) Treating [name], [name] Primary Care Physician/Extender: Claudette Laws Physician: Referring Physician: [name] in Treatment: 32 Edema Assessment Assessed: [Left: No] [Right: No] E[Left: dema] [Right: :] Calf Left: Right: Point of Measurement: 34 cm From Medial Instep 30.6 cm 36.5 cm Ankle Left: Right: Point of Measurement: 10 cm From Medial Instep 22 cm 23.7 cm Vascular Assessment Pulses: Dorsalis Pedis Palpable: [Left:Yes] [Right:Yes] Posterior Tibial Extremity colors, hair growth, and conditions: Extremity Color: [Left:Mottled] [Right:Mottled] Temperature of Extremity: [Left:Warm] [Right:Warm] Capillary Refill: [Left:< 3 seconds] [Right:< 3 seconds] Toe Nail Assessment Left: Right: Thick: Yes Yes Discolored: No No Deformed: No No Improper Length and Hygiene: No No Electronic Signature(s) Signed: 07/09/2016 4:34:43  PM By: [name] Entered By: [name] on 07/09/2016 13:56:22 Sheller, Bernadetta C. ([id number]) Crowell, Soriyah C. ([id number]) -------------------------------------------------------------------------------- Multi Wound Chart Details Patient Name: [name], [name] Date of Service: 07/09/2016 1:15 PM Medical Record Patient Account Number: [id number] Number: Treating RN: [name] [birth year] (82 y.o. Other Clinician: Date of Birth/Sex: Female) Treating [name], [name] Primary Care Physician/Extender: Claudette Laws Physician: Referring Physician: [name] in Treatment: 32 Vital Signs Height(in): 68 Pulse(bpm): 73 Weight(lbs): 178 Blood Pressure 114/58 (mmHg): Body Mass Index(BMI): 27 Temperature(F): 97.5 Respiratory Rate 16 (breaths/min): Photos: [8:No Photos] [9:No Photos] [N/A:N/A] Wound Location: [8:Right Lower Leg - Medial] [  9:Left Lower Leg - Posterior] [N/A:N/A] Wounding Event: [8:Trauma] [9:Gradually Appeared] [N/A:N/A] Primary Etiology: [8:Venous Leg Ulcer] [9:Venous Leg Ulcer] [N/A:N/A] Comorbid History: [8:Cataracts, Arrhythmia, Hypotension, Osteoarthritis] [9:Cataracts, Arrhythmia, Hypotension, Osteoarthritis] [N/A:N/A] Date Acquired: [8:04/06/2016] [9:06/26/2016] [N/A:N/A] Weeks of Treatment: [8:10] [9:1] [N/A:N/A] Wound Status: [8:Open] [9:Open] [N/A:N/A] Measurements L x W x D 0.3x0.7x0.1 [9:2.6x1.8x0.2] [N/A:N/A] (cm) Area (cm) : [8:0.165] [9:3.676] [N/A:N/A] Volume (cm) : [8:0.016] [9:0.735] [N/A:N/A] % Reduction in Area: [8:25.00%] [9:37.60%] [N/A:N/A] % Reduction in Volume: 27.30% [9:-24.80%] [N/A:N/A] Classification: [8:Full Thickness Without Exposed Support Structures] [9:Full Thickness Without Exposed Support Structures] [N/A:N/A] Exudate Amount: [8:Medium] [9:Large] [N/A:N/A] Exudate Type: [8:Serosanguineous] [9:Serosanguineous] [N/A:N/A] Exudate Color: [8:red, brown] [9:red, brown] [N/A:N/A] Wound  Margin: [8:Distinct, outline attached] [9:Flat and Intact] [N/A:N/A] Granulation Amount: [8:None Present (0%)] [9:Small (1-33%)] [N/A:N/A] Granulation Quality: [8:N/A] [9:Red] [N/A:N/A] Necrotic Amount: [8:Large (67-100%)] [9:Large (67-100%)] [N/A:N/A] Necrotic Tissue: [8:Eschar] [9:Adherent Slough] [N/A:N/A] Exposed Structures: Fat: Yes Fascia: No N/A Fascia: No Fat: No Tendon: No Tendon: No Muscle: No Muscle: No Joint: No Joint: No Bone: No Bone: No Limited to Skin Breakdown Epithelialization: None None N/A Debridement: Debridement ([id number] Debridement (11941- N/A 11047) 11047) Pre-procedure 13:52 13:52 N/A Verification/Time Out Taken: Pain Control: Lidocaine 4% Topical Lidocaine 4% Topical N/A Solution Solution Tissue Debrided: Fibrin/Slough, Exudates, Fibrin/Slough, Exudates, N/A Subcutaneous Subcutaneous Level: Skin/Subcutaneous Skin/Subcutaneous N/A Tissue Tissue Debridement Area (sq 0.21 4.68 N/A cm): Instrument: Curette Curette N/A Bleeding: Minimum Minimum N/A Hemostasis Achieved: Pressure Pressure N/A Procedural Pain: 0 0 N/A Post Procedural Pain: 0 0 N/A Debridement Treatment Procedure was tolerated Procedure was tolerated N/A Response: well well Post Debridement 0.3x0.7x0.3 2.6x1.8x0.2 N/A Measurements L x W x D (cm) Post Debridement 0.049 0.735 N/A Volume: (cm) Periwound Skin Texture: No Abnormalities Noted No Abnormalities Noted N/A Periwound Skin Dry/Scaly: Yes Moist: Yes N/A Moisture: Periwound Skin Color: No Abnormalities Noted No Abnormalities Noted N/A Temperature: No Abnormality No Abnormality N/A Tenderness on Yes Yes N/A Palpation: Wound Preparation: Ulcer Cleansing: Ulcer Cleansing: N/A Rinsed/Irrigated with Rinsed/Irrigated with Saline Saline Topical Anesthetic Topical Anesthetic Applied: Other: lidocaine Applied: Other: lidociane 4% 4% Procedures Performed: Debridement Debridement N/A DASHAYLA, THEISSEN ([id number]) Treatment  Notes Electronic Signature(s) Signed: 07/09/2016 4:24:12 PM By: [name] MD Entered By: [name] on 07/09/2016 14:27:04 [place], [name] ([id number]) -------------------------------------------------------------------------------- Multi-Disciplinary Care Plan Details Patient Name: JANIQUE, HOEFER. Date of Service: 07/09/2016 1:15 PM Medical Record Patient Account Number: [id number] Number: Treating RN: [name] 06-May-1935 (81 y.o. Other Clinician: Date of Birth/Sex: Female) Treating [name], [name] Primary Care Physician/Extender: Claudette Laws Physician: Referring Physician: [name] in Treatment: 22 Active Inactive Orientation to the Wound Care Program Nursing Diagnoses: Knowledge deficit related to the wound healing center program Goals: Patient/caregiver will verbalize understanding of the [facility] Program Date Initiated: 11/22/2015 Goal Status: Active Interventions: Provide education on orientation to the wound center Notes: Venous Leg Ulcer Nursing Diagnoses: Knowledge deficit related to disease process and management Potential for venous Insuffiency (use before diagnosis confirmed) Goals: Patient will maintain optimal edema control Date Initiated: 11/22/2015 Goal Status: Active Patient/caregiver will verbalize understanding of disease process and disease management Date Initiated: 11/22/2015 Goal Status: Active Verify adequate tissue perfusion prior to therapeutic compression application Date Initiated: 11/22/2015 Goal Status: Active Interventions: Assess peripheral edema status every visit. TAHLOR, BERENGUER ([id number]) Compression as ordered Provide education on venous insufficiency Treatment Activities: Non-invasive vascular studies : 11/22/2015 Therapeutic compression applied : 11/22/2015 Notes: Wound/Skin Impairment Nursing Diagnoses: Impaired tissue integrity Knowledge deficit related to  ulceration/compromised skin integrity  Goals: Patient/caregiver will verbalize understanding of skin care regimen Date Initiated: 11/22/2015 Goal Status: Active Ulcer/skin breakdown will have a volume reduction of 30% by week 4 Date Initiated: 11/22/2015 Goal Status: Active Ulcer/skin breakdown will have a volume reduction of 50% by week 8 Date Initiated: 11/22/2015 Goal Status: Active Ulcer/skin breakdown will have a volume reduction of 80% by week 12 Date Initiated: 11/22/2015 Goal Status: Active Ulcer/skin breakdown will heal within 14 weeks Date Initiated: 11/22/2015 Goal Status: Active Interventions: Assess patient/caregiver ability to perform ulcer/skin care regimen upon admission and as needed Assess ulceration(s) every visit Provide education on ulcer and skin care Treatment Activities: Skin care regimen initiated : 11/22/2015 Topical wound management initiated : 11/22/2015 Notes: Electronic Signature(s) Signed: 07/09/2016 4:34:43 PM By: De Burrs, [name] ([id number]) Entered By: [name] on 07/09/2016 13:56:27 Tullos, Yasaman Loletha Grayer ([id number]) -------------------------------------------------------------------------------- Pain Assessment Details Patient Name: Carrie Mulligan C. Date of Service: 07/09/2016 1:15 PM Medical Record Patient Account Number: [id number] Number: Treating RN: [name] 02-06-1935 (81 y.o. Other Clinician: Date of Birth/Sex: Female) Treating [name], [name] Primary Care Physician/Extender: Claudette Laws Physician: Referring Physician: [name] in Treatment: 32 Active Problems Location of Pain Severity and Description of Pain Patient Has Paino No Site Locations With Dressing Change: No Pain Management and Medication Current Pain Management: Electronic Signature(s) Signed: 07/09/2016 4:34:43 PM By: [name] Entered By: [name] on 07/09/2016 13:39:33 Lapinsky, [name]  ([id number]) -------------------------------------------------------------------------------- Patient/Caregiver Education Details Patient Name: [name] Date of Service: 07/09/2016 1:15 PM Medical Record Patient Account Number: [id number] Number: Treating RN: [name] [birth year] (81 y.o. Other Clinician: Date of Birth/Gender: Female) Treating [name], [name] Primary Care Physician/Extender: Claudette Laws Physician: Suella [place] in Treatment: 32 Referring Physician: [name] Education Assessment Education Provided To: Patient Education Topics Provided Wound/Skin Impairment: Handouts: Other: change dressing as ordered and do not get dressing wet Methods: Demonstration, Explain/Verbal Responses: State content correctly Electronic Signature(s) Signed: 07/09/2016 4:34:43 PM By: [name] Entered By: [name] on 07/09/2016 15:37:59 Stafford, Kenvil. ([id number]) -------------------------------------------------------------------------------- Wound Assessment Details Patient Name: [name], Devanny C. Date of Service: 07/09/2016 1:15 PM Medical Record Patient Account Number: [id number] Number: Treating RN: [name] 05/06/1935 (81 y.o. Other Clinician: Date of Birth/Sex: Female) Treating [name], [name] Primary Care Physician/Extender: Claudette Laws Physician: Referring Physician: [name] in Treatment: 32 Wound Status Wound Number: 8 Primary Venous Leg Ulcer Etiology: Wound Location: Right Lower Leg - Medial Wound Status: Open Wounding Event: Trauma Comorbid Cataracts, Arrhythmia, Hypotension, Date Acquired: 04/06/2016 History: Osteoarthritis Weeks Of Treatment: 10 Clustered Wound: No Photos Photo Uploaded By: [name] on 07/09/2016 15:59:58 Wound Measurements Length: (cm) 0.3 Width: (cm) 0.7 Depth: (cm) 0.1 Area: (cm) 0.165 Volume: (cm) 0.016 % Reduction in Area: 25% % Reduction in  Volume: 27.3% Epithelialization: None Tunneling: No Undermining: No Wound Description Full Thickness Without Exposed Classification: Support Structures Wound Margin: Distinct, outline attached Exudate Medium Amount: Exudate Type: Serosanguineous Exudate Color: red, brown Foul Odor After Cleansing: No Wound Bed Lindamood, Piya C. ([id number]) Granulation Amount: None Present (0%) Exposed Structure Necrotic Amount: Large (67-100%) Fascia Exposed: No Necrotic Quality: Eschar Fat Layer Exposed: Yes Tendon Exposed: No Muscle Exposed: No Joint Exposed: No Bone Exposed: No Periwound Skin Texture Texture Color No Abnormalities Noted: No No Abnormalities Noted: No Moisture Temperature / Pain No Abnormalities Noted: No Temperature: No Abnormality Dry / Scaly: Yes Tenderness on Palpation: Yes Wound Preparation Ulcer Cleansing: Rinsed/Irrigated with Saline Topical Anesthetic Applied: Other: lidocaine  4%, Treatment Notes Wound #8 (Right, Medial Lower Leg) 1. Cleansed with: Clean wound with Normal Saline Cleanse wound with antibacterial soap and water 2. Anesthetic Topical Lidocaine 4% cream to wound bed prior to debridement 4. Dressing Applied: Iodoflex 5. Secondary Dressing Applied ABD Pad Dry Gauze 7. Secured with Tape Notes kerlix, coban Electronic Signature(s) Signed: 07/09/2016 4:34:43 PM By: [name] Entered By: [name] on 07/09/2016 13:45:05 Odonell, Minahil Loletha Grayer ([id number]) -------------------------------------------------------------------------------- Wound Assessment Details Patient Name: [name], Shevaun C. Date of Service: 07/09/2016 1:15 PM Medical Record Patient Account Number: [id number] Number: Treating RN: [name] 1935/06/26 (81 y.o. Other Clinician: Date of Birth/Sex: Female) Treating [name], [name] Primary Care Physician/Extender: Claudette Laws Physician: Referring Physician: [name] in  Treatment: 32 Wound Status Wound Number: 9 Primary Venous Leg Ulcer Etiology: Wound Location: Left Lower Leg - Posterior Wound Status: Open Wounding Event: Gradually Appeared Comorbid Cataracts, Arrhythmia, Hypotension, Date Acquired: 06/26/2016 History: Osteoarthritis Weeks Of Treatment: 1 Clustered Wound: No Photos Photo Uploaded By: [name] on 07/09/2016 15:59:58 Wound Measurements Length: (cm) 2.6 % Reduction in Width: (cm) 1.8 % Reduction in Depth: (cm) 0.2 Epithelializati Area: (cm) 3.676 Tunneling: Volume: (cm) 0.735 Undermining: Area: 37.6% Volume: -24.8% on: None No No Wound Description Full Thickness Without Exposed Classification: Support Structures Wound Margin: Flat and Intact Exudate Large Amount: Exudate Type: Serosanguineous Exudate Color: red, brown Foul Odor After Cleansing: No Wound Bed Manolis, Addysyn C. ([id number]) Granulation Amount: Small (1-33%) Exposed Structure Granulation Quality: Red Fascia Exposed: No Necrotic Amount: Large (67-100%) Fat Layer Exposed: No Necrotic Quality: Adherent Slough Tendon Exposed: No Muscle Exposed: No Joint Exposed: No Bone Exposed: No Limited to Skin Breakdown Periwound Skin Texture Texture Color No Abnormalities Noted: No No Abnormalities Noted: No Moisture Temperature / Pain No Abnormalities Noted: No Temperature: No Abnormality Moist: Yes Tenderness on Palpation: Yes Wound Preparation Ulcer Cleansing: Rinsed/Irrigated with Saline Topical Anesthetic Applied: Other: lidociane 4%, Treatment Notes Wound #9 (Left, Posterior Lower Leg) 1. Cleansed with: Clean wound with Normal Saline Cleanse wound with antibacterial soap and water 2. Anesthetic Topical Lidocaine 4% cream to wound bed prior to debridement 4. Dressing Applied: Iodoflex 5. Secondary Dressing Applied ABD Pad Dry Gauze 7. Secured with Tape Notes kerlix, coban Electronic Signature(s) Signed: 07/09/2016 4:34:43  PM By: [name] Entered By: [name] on 07/09/2016 13:47:12 [place], [name] ([id number]) -------------------------------------------------------------------------------- Vitals Details Patient Name: Carrie Mulligan C. Date of Service: 07/09/2016 1:15 PM Medical Record Patient Account Number: [id number] Number: Treating RN: [name] 08/19/1934 (81 y.o. Other Clinician: Date of Birth/Sex: Female) Treating [name], [name] Primary Care Physician/Extender: Claudette Laws Physician: Referring Physician: [name] in Treatment: 32 Vital Signs Time Taken: 13:39 Temperature (F): 97.5 Height (in): 68 Pulse (bpm): 73 Weight (lbs): 178 Respiratory Rate (breaths/min): 16 Body Mass Index (BMI): 27.1 Blood Pressure (mmHg): 114/58 Reference Range: 80 - 120 mg / dl Electronic Signature(s) Signed: 07/09/2016 4:34:43 PM By: [name] Entered By: [name] on 07/09/2016 13:40:01

## 2016-07-12 LAB — PROTIME-INR
INR: 1.56
Prothrombin Time: 18.8 seconds — ABNORMAL HIGH (ref 11.4–15.2)

## 2016-07-15 LAB — PROTIME-INR
INR: 1.68
Prothrombin Time: 20 seconds — ABNORMAL HIGH (ref 11.4–15.2)

## 2016-07-16 ENCOUNTER — Encounter: Payer: Medicare Other | Admitting: Internal Medicine

## 2016-07-16 DIAGNOSIS — I87333 Chronic venous hypertension (idiopathic) with ulcer and inflammation of bilateral lower extremity: Secondary | ICD-10-CM | POA: Diagnosis not present

## 2016-07-17 ENCOUNTER — Ambulatory Visit: Payer: Medicare Other | Admitting: Cardiovascular Disease

## 2016-07-18 LAB — PROTIME-INR
INR: 2.45
Prothrombin Time: 27 seconds — ABNORMAL HIGH (ref 11.4–15.2)

## 2016-07-19 LAB — PROTIME-INR
INR: 2.99
Prothrombin Time: 31.7 seconds — ABNORMAL HIGH (ref 11.4–15.2)

## 2016-07-22 ENCOUNTER — Non-Acute Institutional Stay (SKILLED_NURSING_FACILITY): Payer: Medicare Other | Admitting: Gerontology

## 2016-07-22 DIAGNOSIS — Z5181 Encounter for therapeutic drug level monitoring: Secondary | ICD-10-CM | POA: Diagnosis not present

## 2016-07-22 DIAGNOSIS — G8918 Other acute postprocedural pain: Secondary | ICD-10-CM | POA: Diagnosis not present

## 2016-07-22 LAB — PROTIME-INR
INR: 2.26
Prothrombin Time: 25.3 seconds — ABNORMAL HIGH (ref 11.4–15.2)

## 2016-07-22 NOTE — Progress Notes (Signed)
CIENA, SAMPLEY ([id number]) Visit Report for 07/16/2016 Arrival Information Details Patient Name: [name], [name]. Date of Service: 07/16/2016 1:30 PM Medical Record Patient Account Number: [id number] Number: Treating RN: [name] 11-Mar-1935 (81 y.o. Other Clinician: Date of Birth/Sex: Female) Treating [name], [name] Primary Care Physician/Extender: Claudette Laws Physician: Referring Physician: [name] in Treatment: 33 Visit Information History Since Last Visit All ordered tests and consults were completed: No Patient Arrived: Wheel Chair Added or deleted any medications: No Arrival Time: 13:14 Any new allergies or adverse reactions: No Accompanied By: self Had a fall or experienced change in No Transfer Assistance: EasyPivot Patient activities of daily living that may affect Lift risk of falls: Patient Identification Verified: Yes Signs or symptoms of abuse/neglect since last No Secondary Verification Process Yes visito Completed: Hospitalized since last visit: No Patient Requires Transmission- No Has Dressing in Place as Prescribed: No Based Precautions: Has Compression in Place as Prescribed: No Patient Has Alerts: Yes Pain Present Now: No Patient Alerts: Patient on Blood Thinner [place]!!! Notes pt had shower before she came so her dressing and compression were off and I will replace them. Electronic Signature(s) Signed: 07/22/2016 8:01:02 AM By: [name] Entered By: [name] on 07/16/2016 13:15:41 [name], [name] ([id number]) -------------------------------------------------------------------------------- Clinic Level of Care Assessment Details Patient Name: [name], [name] Date of Service: 07/16/2016 1:30 PM Medical Record Patient Account Number: [id number] Number: Treating RN: [name] [birth year] (81 y.o. Other Clinician: Date of  Birth/Sex: Female) Treating [name], [name] Primary Care Physician/Extender: Claudette Laws Physician: Referring Physician: [name] in Treatment: 33 Clinic Level of Care Assessment Items TOOL 4 Quantity Score X - Use when only an EandM is performed on FOLLOW-UP visit 1 0 ASSESSMENTS - Nursing Assessment / Reassessment X - Reassessment of Co-morbidities (includes updates in patient status) 1 10 X - Reassessment of Adherence to Treatment Plan 1 5 ASSESSMENTS - Wound and Skin Assessment / Reassessment []  - Simple Wound Assessment / Reassessment - one wound 0 X - Complex Wound Assessment / Reassessment - multiple wounds 2 5 []  - Dermatologic / Skin Assessment (not related to wound area) 0 ASSESSMENTS - Focused Assessment X - Circumferential Edema Measurements - multi extremities 2 5 []  - Nutritional Assessment / Counseling / Intervention 0 []  - Lower Extremity Assessment (monofilament, tuning fork, pulses) 0 []  - Peripheral Arterial Disease Assessment (using hand held doppler) 0 ASSESSMENTS - Ostomy and/or Continence Assessment and Care []  - Incontinence Assessment and Management 0 []  - Ostomy Care Assessment and Management (repouching, etc.) 0 PROCESS - Coordination of Care []  - Simple Patient / Family Education for ongoing care 0 X - Complex (extensive) Patient / Family Education for ongoing care 1 20 X - Staff obtains Consents, Records, Test Results / Process Orders 1 10 [name], [name] ([id number]) X - Staff telephones HHA, Nursing Homes / Clarify orders / etc 1 10 []  - Routine Transfer to another Facility (non-emergent condition) 0 []  - Routine Hospital Admission (non-emergent condition) 0 []  - New Admissions / Biomedical engineer / Ordering NPWT, Apligraf, etc. 0 []  - Emergency Hospital Admission (emergent condition) 0 X - Simple Discharge Coordination 1 10 []  - Complex (extensive) Discharge Coordination 0 PROCESS - Special Needs []  - Pediatric / Minor  Patient Management 0 []  - Isolation Patient Management 0 []  - Hearing / Language / Visual special needs 0 []  - Assessment of Community assistance (transportation, D/C planning, etc.) 0 []  - Additional assistance /  Altered mentation 0 []  - Support Surface(s) Assessment (bed, cushion, seat, etc.) 0 INTERVENTIONS - Wound Cleansing / Measurement []  - Simple Wound Cleansing - one wound 0 X - Complex Wound Cleansing - multiple wounds 2 5 X - Wound Imaging (photographs - any number of wounds) 1 5 []  - Wound Tracing (instead of photographs) 0 []  - Simple Wound Measurement - one wound 0 X - Complex Wound Measurement - multiple wounds 2 5 INTERVENTIONS - Wound Dressings []  - Small Wound Dressing one or multiple wounds 0 []  - Medium Wound Dressing one or multiple wounds 0 X - Large Wound Dressing one or multiple wounds 2 20 X - Application of Medications - topical 1 5 []  - Application of Medications - injection 0 [name], [name] ([id number]) INTERVENTIONS - Miscellaneous []  - External ear exam 0 []  - Specimen Collection (cultures, biopsies, blood, body fluids, etc.) 0 []  - Specimen(s) / Culture(s) sent or taken to Lab for analysis 0 []  - Patient Transfer (multiple staff / Harrel Lemon Lift / Similar devices) 0 []  - Simple Staple / Suture removal (25 or less) 0 []  - Complex Staple / Suture removal (26 or more) 0 []  - Hypo / Hyperglycemic Management (close monitor of Blood Glucose) 0 []  - Ankle / Brachial Index (ABI) - do not check if billed separately 0 X - Vital Signs 1 5 Has the patient been seen at the hospital within the last three years: Yes Total Score: 160 Level Of Care: New/Established - Level 5 Electronic Signature(s) Signed: 07/22/2016 8:01:02 AM By: [name] Entered By: [name] on 07/16/2016 15:18:07 [name], [name] ([id number]) -------------------------------------------------------------------------------- Encounter Discharge Information Details Patient  Name: Carrie Mulligan C. Date of Service: 07/16/2016 1:30 PM Medical Record Patient Account Number: [id number] Number: Treating RN: [name] 08-27-1934 (81 y.o. Other Clinician: Date of Birth/Sex: Female) Treating [name], [name] Primary Care Physician/Extender: Claudette Laws Physician: Referring Physician: [name] in Treatment: 44 Encounter Discharge Information Items Discharge Pain Level: 0 Discharge Condition: Stable Ambulatory Status: Wheelchair Discharge Destination: Nursing Home Transportation: Other Accompanied By: self Schedule Follow-up Appointment: Yes Medication Reconciliation completed and provided to Patient/Care Yes Kaleea Penner: Provided on Clinical Summary of Care: 07/16/2016 Form Type Recipient Paper Patient [place] Electronic Signature(s) Signed: 07/16/2016 1:50:30 PM By: Ruthine Dose Entered By: Ruthine Dose on 07/16/2016 13:50:30 Gholson, [name] ([id number]) -------------------------------------------------------------------------------- Lower Extremity Assessment Details Patient Name: Carrie Mulligan C. Date of Service: 07/16/2016 1:30 PM Medical Record Patient Account Number: [id number] Number: Treating RN: [name] Aug 02, 1934 (81 y.o. Other Clinician: Date of Birth/Sex: Female) Treating [name], [name] Primary Care Physician/Extender: Claudette Laws Physician: Referring Physician: [name] in Treatment: 33 Edema Assessment Assessed: [Left: No] [Right: No] E[Left: dema] [Right: :] Calf Left: Right: Point of Measurement: 34 cm From Medial Instep 30.6 cm 36.6 cm Ankle Left: Right: Point of Measurement: 10 cm From Medial Instep 22 cm 23.6 cm Vascular Assessment Pulses: Dorsalis Pedis Palpable: [Left:Yes] [Right:Yes] Posterior Tibial Extremity colors, hair growth, and conditions: Extremity Color: [Left:Mottled] [Right:Mottled] Temperature of Extremity: [Left:Warm]  [Right:Warm] Capillary Refill: [Left:< 3 seconds] [Right:< 3 seconds] Electronic Signature(s) Signed: 07/22/2016 8:01:02 AM By: [name] Entered By: [name] on 07/16/2016 13:25:17 Onofrio, [name] ([id number]) -------------------------------------------------------------------------------- Multi Wound Chart Details Patient Name: Carrie Mulligan C. Date of Service: 07/16/2016 1:30 PM Medical Record Patient Account Number: [id number] Number: Treating RN: [name] [birth year] (81 y.o. Other Clinician: Date of Birth/Sex: Female) Treating [name], [name] Primary Care Physician/Extender: Claudette Laws Physician:  Referring Physician: [name] in Treatment: 33 Vital Signs Height(in): 68 Pulse(bpm): 76 Weight(lbs): 178 Blood Pressure 121/60 (mmHg): Body Mass Index(BMI): 27 Temperature(F): 97.5 Respiratory Rate 16 (breaths/min): Photos: [8:No Photos] [9:No Photos] [N/A:N/A] Wound Location: [8:Right Lower Leg - Medial] [9:Left Lower Leg - Posterior] [N/A:N/A] Wounding Event: [8:Trauma] [9:Gradually Appeared] [N/A:N/A] Primary Etiology: [8:Venous Leg Ulcer] [9:Venous Leg Ulcer] [N/A:N/A] Comorbid History: [8:Cataracts, Arrhythmia, Hypotension, Osteoarthritis] [9:Cataracts, Arrhythmia, Hypotension, Osteoarthritis] [N/A:N/A] Date Acquired: [8:04/06/2016] [9:06/26/2016] [N/A:N/A] Weeks of Treatment: [8:11] [9:2] [N/A:N/A] Wound Status: [8:Open] [9:Open] [N/A:N/A] Measurements L x W x D 0.2x0.3x0.2 [9:2x1.7x0.2] [N/A:N/A] (cm) Area (cm) : [8:0.047] [9:2.67] [N/A:N/A] Volume (cm) : [8:0.009] [9:0.534] [N/A:N/A] % Reduction in Area: [8:78.60%] [9:54.70%] [N/A:N/A] % Reduction in Volume: 59.10% [9:9.30%] [N/A:N/A] Classification: [8:Full Thickness Without Exposed Support Structures] [9:Full Thickness Without Exposed Support Structures] [N/A:N/A] Exudate Amount: [8:Medium] [9:Large] [N/A:N/A] Exudate Type: [8:Serosanguineous]  [9:Serosanguineous] [N/A:N/A] Exudate Color: [8:red, brown] [9:red, brown] [N/A:N/A] Wound Margin: [8:Distinct, outline attached] [9:Flat and Intact] [N/A:N/A] Granulation Amount: [8:None Present (0%)] [9:Small (1-33%)] [N/A:N/A] Granulation Quality: [8:N/A] [9:Red] [N/A:N/A] Necrotic Amount: [8:Large (67-100%)] [9:Large (67-100%)] [N/A:N/A] Exposed Structures: [N/A:N/A] Fat: Yes Fascia: No Fascia: No Fat: No Tendon: No Tendon: No Muscle: No Muscle: No Joint: No Joint: No Bone: No Bone: No Limited to Skin Breakdown Epithelialization: Large (67-100%) None N/A Periwound Skin Texture: No Abnormalities Noted No Abnormalities Noted N/A Periwound Skin Dry/Scaly: Yes Moist: Yes N/A Moisture: Periwound Skin Color: No Abnormalities Noted No Abnormalities Noted N/A Temperature: No Abnormality No Abnormality N/A Tenderness on Yes Yes N/A Palpation: Wound Preparation: Ulcer Cleansing: Ulcer Cleansing: N/A Rinsed/Irrigated with Rinsed/Irrigated with Saline Saline Topical Anesthetic Topical Anesthetic Applied: Other: lidocaine Applied: Other: lidociane 4% 4% Treatment Notes Wound #8 (Right, Medial Lower Leg) 1. Cleansed with: Clean wound with Normal Saline 2. Anesthetic Topical Lidocaine 4% cream to wound bed prior to debridement 4. Dressing Applied: Iodoflex 5. Secondary Dressing Applied ABD Pad Dry Gauze Notes [email or web address] Wound #9 (Left, Posterior Lower Leg) 1. Cleansed with: Clean wound with Normal Saline 2. Anesthetic Topical Lidocaine 4% cream to wound bed prior to debridement 4. Dressing Applied: Iodoflex 5. Secondary Dressing Applied ABD Pad Dry Gauze Bonillas, [name] ([id number]) Notes [email or web address] Electronic Signature(s) Signed: 07/17/2016 7:43:54 AM By: [name] MD Entered By: [name] on 07/16/2016 14:31:33 [place], [name]  ([id number]) -------------------------------------------------------------------------------- Multi-Disciplinary Care Plan Details Patient Name: [name], [place]. Date of Service: 07/16/2016 1:30 PM Medical Record Patient Account Number: [id number] Number: Treating RN: [name] 1935-01-11 (81 y.o. Other Clinician: Date of Birth/Sex: Female) Treating [name], [name] Primary Care Physician/Extender: Claudette Laws Physician: Referring Physician: [name] in Treatment: 104 Active Inactive Orientation to the Wound Care Program Nursing Diagnoses: Knowledge deficit related to the wound healing center program Goals: Patient/caregiver will verbalize understanding of the [place] Program Date Initiated: 11/22/2015 Goal Status: Active Interventions: Provide education on orientation to the wound center Notes: Venous Leg Ulcer Nursing Diagnoses: Knowledge deficit related to disease process and management Potential for venous Insuffiency (use before diagnosis confirmed) Goals: Patient will maintain optimal edema control Date Initiated: 11/22/2015 Goal Status: Active Patient/caregiver will verbalize understanding of disease process and disease management Date Initiated: 11/22/2015 Goal Status: Active Verify adequate tissue perfusion prior to therapeutic compression application Date Initiated: 11/22/2015 Goal Status: Active Interventions: Assess peripheral edema status every visit. [name], [name] ([id number]) Compression as ordered Provide education on venous insufficiency Treatment Activities: Non-invasive vascular studies : 11/22/2015 Therapeutic compression applied : 11/22/2015 Notes: Wound/Skin Impairment Nursing  Diagnoses: Impaired tissue integrity Knowledge deficit related to ulceration/compromised skin integrity Goals: Patient/caregiver will verbalize understanding of skin care regimen Date Initiated: 11/22/2015 Goal Status:  Active Ulcer/skin breakdown will have a volume reduction of 30% by week 4 Date Initiated: 11/22/2015 Goal Status: Active Ulcer/skin breakdown will have a volume reduction of 50% by week 8 Date Initiated: 11/22/2015 Goal Status: Active Ulcer/skin breakdown will have a volume reduction of 80% by week 12 Date Initiated: 11/22/2015 Goal Status: Active Ulcer/skin breakdown will heal within 14 weeks Date Initiated: 11/22/2015 Goal Status: Active Interventions: Assess patient/caregiver ability to perform ulcer/skin care regimen upon admission and as needed Assess ulceration(s) every visit Provide education on ulcer and skin care Treatment Activities: Skin care regimen initiated : 11/22/2015 Topical wound management initiated : 11/22/2015 Notes: Electronic Signature(s) Signed: 07/22/2016 8:01:02 AM By: De Burrs, [name] ([id number]) Entered By: [name] on 07/16/2016 13:25:22 Kloos, [name] ([id number]) -------------------------------------------------------------------------------- Pain Assessment Details Patient Name: Carrie Mulligan C. Date of Service: 07/16/2016 1:30 PM Medical Record Patient Account Number: [id number] Number: Treating RN: [name] [birth year] (81 y.o. Other Clinician: Date of Birth/Sex: Female) Treating [name], [name] Primary Care Physician/Extender: Claudette Laws Physician: Referring Physician: [name] in Treatment: 33 Active Problems Location of Pain Severity and Description of Pain Patient Has Paino No Site Locations With Dressing Change: No Pain Management and Medication Current Pain Management: Electronic Signature(s) Signed: 07/22/2016 8:01:02 AM By: [name] Entered By: [name] on 07/16/2016 13:16:24 Pesantez, [name] ([id number]) -------------------------------------------------------------------------------- Patient/Caregiver Education Details Patient Name: Carrie Kussmaul. Date of Service: 07/16/2016 1:30 PM Medical Record Patient Account Number: [id number] Number: Treating RN: [name] 1934-11-26 (81 y.o. Other Clinician: Date of Birth/Gender: Female) Treating [name], [name] Primary Care Physician/Extender: Claudette Laws Physician: Suella [place] in Treatment: 33 Referring Physician: [name] Education Assessment Education Provided To: Patient Education Topics Provided Wound/Skin Impairment: Handouts: Other: change dressing as ordered and do not get dressings wet Methods: Demonstration, Explain/Verbal Responses: State content correctly Electronic Signature(s) Signed: 07/22/2016 8:01:02 AM By: [name] Entered By: [name] on 07/16/2016 13:26:00 Cui, [name] ([id number]) -------------------------------------------------------------------------------- Wound Assessment Details Patient Name: [name], [name] Date of Service: 07/16/2016 1:30 PM Medical Record Patient Account Number: [id number] Number: Treating RN: [name] Nov 20, 1934 (81 y.o. Other Clinician: Date of Birth/Sex: Female) Treating [name], [name] Primary Care Physician/Extender: Claudette Laws Physician: Referring Physician: [name] in Treatment: 33 Wound Status Wound Number: 8 Primary Venous Leg Ulcer Etiology: Wound Location: Right Lower Leg - Medial Wound Status: Open Wounding Event: Trauma Comorbid Cataracts, Arrhythmia, Hypotension, Date Acquired: 04/06/2016 History: Osteoarthritis Weeks Of Treatment: 11 Clustered Wound: No Photos Photo Uploaded By: [name] on 07/16/2016 15:20:56 Wound Measurements Length: (cm) 0.2 Width: (cm) 0.3 Depth: (cm) 0.2 Area: (cm) 0.047 Volume: (cm) 0.009 % Reduction in Area: 78.6% % Reduction in Volume: 59.1% Epithelialization: Large (67-100%) Tunneling: No Undermining: No Wound Description Full Thickness Without  Exposed Classification: Support Structures Wound Margin: Distinct, outline attached Exudate Medium Amount: Exudate Type: Serosanguineous Exudate Color: red, brown Foul Odor After Cleansing: No Wound Bed Croker, [name] ([id number]) Granulation Amount: None Present (0%) Exposed Structure Necrotic Amount: Large (67-100%) Fascia Exposed: No Necrotic Quality: Adherent Slough Fat Layer Exposed: Yes Tendon Exposed: No Muscle Exposed: No Joint Exposed: No Bone Exposed: No Periwound Skin Texture Texture Color No Abnormalities Noted: No No Abnormalities Noted: No Moisture Temperature / Pain No Abnormalities Noted: No Temperature: No Abnormality Dry / Scaly: Yes Tenderness on  Palpation: Yes Wound Preparation Ulcer Cleansing: Rinsed/Irrigated with Saline Topical Anesthetic Applied: Other: lidocaine 4%, Treatment Notes Wound #8 (Right, Medial Lower Leg) 1. Cleansed with: Clean wound with Normal Saline 2. Anesthetic Topical Lidocaine 4% cream to wound bed prior to debridement 4. Dressing Applied: Iodoflex 5. Secondary Dressing Applied ABD Pad Dry Gauze Notes [email or web address] Electronic Signature(s) Signed: 07/22/2016 8:01:02 AM By: [name] Entered By: [name] on 07/16/2016 13:20:48 Kapral, [name] ([id number]) -------------------------------------------------------------------------------- Wound Assessment Details Patient Name: Carrie Mulligan C. Date of Service: 07/16/2016 1:30 PM Medical Record Patient Account Number: [id number] Number: Treating RN: [name] 1935-03-11 (81 y.o. Other Clinician: Date of Birth/Sex: Female) Treating [name], [name] Primary Care Physician/Extender: Claudette Laws Physician: Referring Physician: [name] in Treatment: 33 Wound Status Wound Number: 9 Primary Venous Leg Ulcer Etiology: Wound Location: Left Lower Leg - Posterior Wound Status: Open Wounding Event: Gradually  Appeared Comorbid Cataracts, Arrhythmia, Hypotension, Date Acquired: 06/26/2016 History: Osteoarthritis Weeks Of Treatment: 2 Clustered Wound: No Photos Photo Uploaded By: [name] on 07/16/2016 15:20:57 Wound Measurements Length: (cm) 2 % Reduction in Width: (cm) 1.7 % Reduction in Depth: (cm) 0.2 Epithelializati Area: (cm) 2.67 Tunneling: Volume: (cm) 0.534 Undermining: Area: 54.7% Volume: 9.3% on: None No No Wound Description Full Thickness Without Exposed Classification: Support Structures Wound Margin: Flat and Intact Exudate Large Amount: Exudate Type: Serosanguineous Exudate Color: red, brown Foul Odor After Cleansing: No Wound Bed Read, [name] ([id number]) Granulation Amount: Small (1-33%) Exposed Structure Granulation Quality: Red Fascia Exposed: No Necrotic Amount: Large (67-100%) Fat Layer Exposed: No Necrotic Quality: Adherent Slough Tendon Exposed: No Muscle Exposed: No Joint Exposed: No Bone Exposed: No Limited to Skin Breakdown Periwound Skin Texture Texture Color No Abnormalities Noted: No No Abnormalities Noted: No Moisture Temperature / Pain No Abnormalities Noted: No Temperature: No Abnormality Moist: Yes Tenderness on Palpation: Yes Wound Preparation Ulcer Cleansing: Rinsed/Irrigated with Saline Topical Anesthetic Applied: Other: lidociane 4%, Treatment Notes Wound #9 (Left, Posterior Lower Leg) 1. Cleansed with: Clean wound with Normal Saline 2. Anesthetic Topical Lidocaine 4% cream to wound bed prior to debridement 4. Dressing Applied: Iodoflex 5. Secondary Dressing Applied ABD Pad Dry Gauze Notes [email or web address] Electronic Signature(s) Signed: 07/22/2016 8:01:02 AM By: [name] Entered By: [name] on 07/16/2016 13:21:56 Sorto, [name] ([id number]) -------------------------------------------------------------------------------- Vitals Details Patient Name: Carrie Mulligan C.  Date of Service: 07/16/2016 1:30 PM Medical Record Patient Account Number: [id number] Number: Treating RN: [name] 08/21/1934 (81 y.o. Other Clinician: Date of Birth/Sex: Female) Treating [name], [name] Primary Care Physician/Extender: Claudette Laws Physician: Referring Physician: [name] in Treatment: 33 Vital Signs Time Taken: 13:17 Temperature (F): 97.5 Height (in): 68 Pulse (bpm): 76 Weight (lbs): 178 Respiratory Rate (breaths/min): 16 Body Mass Index (BMI): 27.1 Blood Pressure (mmHg): 121/60 Reference Range: 80 - 120 mg / dl Electronic Signature(s) Signed: 07/22/2016 8:01:02 AM By: [name] Entered By: [name] on 07/16/2016 13:17:15

## 2016-07-23 ENCOUNTER — Encounter: Payer: Medicare Other | Admitting: Internal Medicine

## 2016-07-23 DIAGNOSIS — I87333 Chronic venous hypertension (idiopathic) with ulcer and inflammation of bilateral lower extremity: Secondary | ICD-10-CM | POA: Diagnosis not present

## 2016-07-23 NOTE — Progress Notes (Signed)
[name] ([id number]) Visit Report for 07/16/2016 Chief Complaint Document Details Carrie, [name] Date of Service: 07/16/2016 1:30 PM Patient Name: C. Patient Account Number: [id number] Medical Record Treating RN: [name] [id number] Number: Other Clinician: June 02, [name] (82 y.o. Treating [name], [name] Date of Birth/Sex: Female) Physician/Extender: Carrie Primary Care [name] Physician: Referring Physician: Melina [name] in Treatment: 33 Information Obtained from: Patient Chief Complaint Patient here for reevaluation of her right lower externally ulcer Electronic Signature(s) Signed: 07/17/2016 7:43:54 AM By: Carrie Ham MD Entered By: [name] on 07/16/2016 14:31:47 [name], [name] ([id number]) -------------------------------------------------------------------------------- HPI Details [name] Date of Service: 07/16/2016 1:30 PM Patient Name: C. Patient Account Number: [id number] Medical Record Treating RN: [name] [id number] Number: Other Clinician: April 10, [name] (82 y.o. Treating [name], [name] Date of Birth/Sex: Female) Physician/Extender: [name] Physician: Referring Physician: Melina [name] in Treatment: 16 History of Present Illness HPI Description: 11/22/15; this is [name] is a 82 year old woman who lives at home on her own. According to the patient and her daughter was present she has had long-standing edema in her legs dating back many years. She also has a history of chronic systolic heart failure, atrial fibrillation and is status post mitral valve replacement. Last echocardiogram I see in [facility] showed an ejection fraction of 40-45% she is on Lasix 60 mg a day and spironolactone 25 mg a day. Her current problem began in December around Christmas time she developed a small hematoma in the medial part of her left leg which rapidly expanded to a very large hematoma that  required surgical debridement. This situation was complicated by the fact that the patient is on long-standing Coumadin for mechanical heart valve. She went to the OR had this evacuated on January 4 /17. The wound has gradually improved however she has developed a small wounds around this area and more recently a wound on the right lateral leg. She is weeping edema fluid. The patient is already been to see vascular surgery. It was recommended that she wear Unna boots, she did not tolerate this due to pain in the left leg. She was then prescribed Juzo stockings and really can't get these on herself although truthfully there is probably too much edema for a Juzo stockings currently. She is not a diabetic and has no history of PAD or claudication that I could elicit. She does not use the external compression pumps reliably. She comes today with notes from her primary physician and Dr. Ronalee [name] both recommending various forms of compression but the patient does not really complied with them. Has been using the external compression pumps with some regularity but certainly not daily on the right leg and this has helped. I also note that her daughter tells me the history that she does not sleep in bed at home. She has a hospital bed but with her legs up she finds this painful so she sleeps in the couch sitting up with her legs dependent. 11/29/15; the patient is arrives today accompanied by her son. He expresses satisfaction that she is maintained the compression all week. 12/06/15; the patient has kept her Profore light wraps on, we have good edema control no major change in the wounds we have been using Aquacel 12/13/15; changed to RTD last week. One of the 3 wounds on her left medial leg is healed she has 2 remaining wounds here and one on the right lateral leg. 12/18/2015 -- the patient was at Dr. Nino Parsley office today and he  was seeing her for an arterial study. While the wrap was being removed she  had an inadvertent laceration of the left proximal anterior leg which bled quite profusely and a compression dressing was applied over this and the patient was here to get her Profore wraps done. I was asked to see the patient to make an assessment and treat appropriately. 12/26/15; the patient's injury on the left proximal leg and Steri-Strips removed after soaking. There is an open area here. The original wounds to still open on the right lateral and left medial leg. 01/03/16 patient's injury on her proximal left leg looks quite good. Still small open area on the medial left leg which appears to be improving. The area on the right lateral leg still substantially open with no real improvement in wound depth. Her edema control is marginal with a Profore light. We have been using RTD [name], [name] ([id number]) for 3-4 weeks without any major change 01/10/16: wounds without s/s of infection. vascular results are pending regarding arterial studies. 01/17/16; patient comes in today complaining of severe pain however I think most of this is in the right hip not related to her wounds. She continues with a oval-shaped wound on the right lateral leg, trauma to the left anterior leg just below her tibial plateau. She has a smaller eschar on the left anterior leg. She is being using Prisma however she informs [place] today that she is actually allergic to silver, nose this from a previous application at [facility] some years ago 01/24/16; edema is not so well controlled today, I think I reduced her to [place] bilaterally last week. The area which was a scissor injury on her left upper anterior lower leg is fully epithelialized. She only has a small open area remaining on the medial aspect of her left leg. The oval-shaped wound on the right mid lateral leg may be a bit smaller. Debrided of surface slough nonviable subcutaneous tissue. I had changed to collagen 2 weeks ago in an attempt to get this to  close 01/31/16 all the patient's wounds on her left leg give healed. We have good edema control with bilateral Profore lites which she has been compliant with. She still has the oval-shaped wound on the right lateral calf allergic even this appears to be slowly improving. The patient has juxtalite stockings at home. She states she thinks she can put these on. She also has external compression pumps at home although I think her compliance with this has not been good in the past. She complains today of edema in her thighs. Tells me she takes 40 mg of Lasix daily 02/21/16; only 1 small wound remains on the lateral aspect of the right calf. She is using Juzo stockings on the left leg although she complains about difficulty in applying them. She has external compression pumps at home 02/28/16; the small open wound on her right lateral calf is improved in terms of wound area. It appears that she has a wrap injury on the anterior aspect of the upper leg 03/06/16; the small open wound on her right lateral calf has a very small open area remaining. The wrap injury on the anterior aspect of the upper leg also appears better. She arrives today in clinic with a history of dyspnea with minimal exertion starting with the last 2-4 days. She does not describe chest pain. Her son and our intake nurse think she has facial swelling. She has a history of an artificial mitral valve on Coumadin  03/13/16; the small wound on the right lateral calf is no better this week. The superior wrap injury anteriorly requires debridement of surface eschar and nonviable subcutaneous tissue. She has been to her primary doctor with regards to her dyspnea we identified last week. Per the patient's description her Lasix has been increased 03/21/2016 -- patient of Dr. Dellia Nims who could not keep her appointment yesterday but has come in today with the right lower leg looking good and this is the leg which has been treated in the recent past.  However, her left lower extremity is extremely swollen, tender and edematous with redness and discoloration. 03/27/16; there is only 2 small areas remain on the right leg. The edema that was so concerning last week has come down however there is extensive bruising on the lateral left leg medial left foot suggesting that she lost some blood into the leg itself. I checked her hemoglobin on 9/21 was 14.5. Her INR was over 6. Duplex ultrasound was negative for DVT 04/03/16 at this point in time today patient is actually doing substantially better in regard to the wound on the anterior right lower extremity. currently there is no slough or eschar noted and no evidence of erythema, discharge, or local infection. She is exhibiting no signs of systemic infection. She is tolerating the dressing changes as well as the wrapping at this point in time. 04/24/16; I have not seen Mrs. Skora for 3 weeks. Apparently she was admitted to hospital for respiratory issues/also apparently has had multiple compression fractures and had kyphoplasty. When she was last here she only had one small open area remaining on the left leg she was using juxtalite stockings. Her son is upset about restarting the Coumadin which she blames or the swelling in her legs. She is on Coumadin for prophylaxis with a chronic artificial heart valve. TEISHA, TROWBRIDGE ([id number]) 04/30/16 at this point in time patient has been tolerating the 3 layer compression wrap as well as the calcium alginate. 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 With tthat being said she does have the continued wound of the left lower extremity as well as the right medial lower extremity. The right side is significantly smaller compared to the left but actually is slough covered. fortunately she has no significant tenderness at rest although with manipulation of the right location of the wound this is significantly tender. 05/07/16 for follow-up evaluation  today both the patient's wounds appear to be significantly improved in size compared to last week. She is also having less pain at both locations currently. Her pain level at most is related to be a 1-2 out of 10. She does have some discharge but fortunately no evidence of infection at this point.she also continues to tolerate the compression wraps well. 05/14/16; the patient's wound on the left leg actually looks as though it's on its way to closure. She is using collagen to this area. She has a small punched out area over the right medial calf. The cause of this is not really clear it has probably 0.4 cm of depth. She has an IV in her right hand which she states is for IV fluid when she develops low sodiums her potassiums, the etiology of this is not clear 05-21-16 she presents today with continued ulcerations the right lower extremity, posterior aspect. she has been using Iodoflex and compression therapy to the area. She denies any new injuries or trauma. She does have 2 areas proximal to this ulceration of dry crust  along with an area to her left posterior lower leg that is purple discolored area appears similar to how her current ulceration started, she denies any trauma or injury to the left posterior leg we will monitor all of these areas. 05/28/16; the patient is down to 1 small open area on the right medial mid calf. This is however larger and in 50% of the surface area deeper approximately 0.4 cm. The reason for this deterioration is unclear. I've gone ahead and done a culture of this area she also tells [place] today that she is short of breath. She is also noted a swelling on her lower left eyelid 06/05/16; the patient is 1 small open area on the right medial mid calf. This looks about the same as last week. The deep area, medial 50% of the wound looks about the same. CandS I did last week was negative. The entire area looks about the same 06/12/16; not much change in this over the course  of the last week. Patient comes in today complaining of extreme back pain. Apparently she has either a kyphoplasty or vertebral plasty scheduled at [facility] radiology tomorrow. For this reason no debridement today. We have been using Iodoflex 06/19/16; patient arrived with the surface eschar from last week removed with a curet debrided of subcutaneous tissue. She tolerates this reasonably well. She is still in a skilled facility. States her vertebral plasty last week is made her pain bearable 06/26/16; patient arrived with a new wound on the posterior aspect of her left calf. She notices 3 or 4 days ago but is not certain how this happened. She states she simply felt a stinging sensation and one day on her foot and then the next day on the back of her calf. 06/28/2016 -- Ms. Reubin Milan called urgently saying that the pain in her left foot was drastically worse over the last several hours and though she removed her compression wrap she wanted to be seen before the long holiday. 07/03/16; she came in to see Dr. Con Memos on 12/29 for pain in her left foot/heel. Nothing much was found at the time and she states that the pain is better but involved her whole foot. She has a wound on the lateral aspect of the left calf and the medial aspect of the right. The medial 1 looks as if it is just about healed however the left one still has considerable edema. 07/09/16; she arrives in clinic today with a dime-sized wound on the left posterior calf and a small linear area on the right medial calf. The area on the left was debrided with a #3 curet nonviable subcutaneous tissue, we are not making progress here. The area on the right was also debrided of surface eschar and nonviable subcutaneous tissue this has some depth to it. 07/16/16; she has a dime size wound on the left posterior calf that has some depth. This required debridement last week, although I did not feel the need to do this today. We used Iodoflex last  week. Small area on the right also was not debrided. This looks like it may be close to closing Carrie, [name] ([id number]) Electronic Signature(s) Signed: 07/17/2016 7:43:54 AM By: Carrie Ham MD Entered By: [name] on 07/16/2016 14:32:39 [name], [name] ([id number]) -------------------------------------------------------------------------------- Physical Exam Details [name] Date of Service: 07/16/2016 1:30 PM Patient Name: C. Patient Account Number: [id number] Medical Record Treating RN: [name] [id number] Number: Other Clinician: Aug 15, [name] (82 y.o. Treating Cannen Dupras, [place] Date of Birth/Sex:  Female) Physician/Extender: Darnell Level [place] Physician: Referring Physician: Melina [name] in Treatment: 33 Constitutional Sitting or standing Blood Pressure is within target range for patient.. Pulse regular and within target range for patient.Marland Kitchen Respirations regular, non-labored and within target range.. Temperature is normal and within the target range for the patient.. Patient's appearance is neat and clean. Appears in no acute distress. Well nourished and well developed.. Eyes Conjunctivae clear. No discharge.Marland Kitchen Respiratory Respiratory effort is easy and symmetric bilaterally. Rate is normal at rest and on room air.. Cardiovascular Pedal pulses absent bilaterally are palpable but reduced. Patient has venous insufficiency however her edema is well controlled. Lymphatic Nonpalpable in the popliteal or inguinal area. Psychiatric No evidence of depression, anxiety, or agitation. Calm, cooperative, and communicative. Appropriate interactions and affect.. Notes Wound exam; the area over the left lower leg still has some surface necrotic material although most of this appears to be healthy granulation. No debridement was done on the right medial leg small linear wound with apparent healthy granulation as well. The patient has  venous insufficiency bilaterally but her edema is controlled Electronic Signature(s) Signed: 07/17/2016 7:43:54 AM By: Carrie Ham MD Entered By: [name] on 07/16/2016 14:38:19 [name], [name] ([id number]) -------------------------------------------------------------------------------- Physician Orders Details [name] Date of Service: 07/16/2016 1:30 PM Patient Name: C. Patient Account Number: [id number] Medical Record Treating RN: [name] [id number] Number: Other Clinician: July 02, [name] (82 y.o. Treating Kamil Hanigan, [place] Date of Birth/Sex: Female) Physician/Extender: Carrie Primary Care [name] Physician: Referring Physician: Melina [name] in Treatment: 64 Verbal / Phone Orders: Yes Clinician: Carolyne Fiscal, Debi Read Back and Verified: Yes Diagnosis Coding Wound Cleansing Wound #8 Right,Medial Lower Leg o Clean wound with Normal Saline. o Cleanse wound with mild soap and water o May Shower, gently pat wound dry prior to applying new dressing. Wound #9 Left,Posterior Lower Leg o Clean wound with Normal Saline. o Cleanse wound with mild soap and water o May Shower, gently pat wound dry prior to applying new dressing. Anesthetic Wound #8 Right,Medial Lower Leg o Topical Lidocaine 4% cream applied to wound bed prior to debridement - for clinic use Wound #9 Left,Posterior Lower Leg o Topical Lidocaine 4% cream applied to wound bed prior to debridement - for clinic use Primary Wound Dressing Wound #8 Right,Medial Lower Leg o Iodoflex Wound #9 Left,Posterior Lower Leg o Iodoflex Secondary Dressing Wound #8 Right,Medial Lower Leg o ABD pad o Dry Gauze Wound #9 Left,Posterior Lower Leg o ABD pad o Dry Gauze Brueggemann, Yarisbel C. ([id number]) Dressing Change Frequency Wound #8 Right,Medial Lower Leg o Other: - SNF change it Fridays and pt is has it changed at wound care clinic on Wednesdays. Wound #9  Left,Posterior Lower Leg o Other: - SNF change it Fridays and pt is has it changed at wound care clinic on Wednesdays. Follow-up Appointments Wound #8 Right,Medial Lower Leg o Return Appointment in 1 week. Wound #9 Left,Posterior Lower Leg o Return Appointment in 1 week. Edema Control Wound #8 Right,Medial Lower Leg o Kerlix and Coban - Bilateral Wound #9 Left,Posterior Lower Leg o Kerlix and Coban - Bilateral Additional Orders / Instructions Wound #8 Right,Medial Lower Leg o Increase protein intake. Wound #9 Left,Posterior Lower Leg o Increase protein intake. Electronic Signature(s) Signed: 07/17/2016 7:43:54 AM By: Carrie Ham MD Signed: 07/22/2016 8:01:02 AM By: [name] Entered By: [name] on 07/16/2016 13:44:34 [name], [name] ([id number]) -------------------------------------------------------------------------------- Problem List Details [name] Date of Service: 07/16/2016 1:30 PM Patient Name: C. Patient Account Number:  [id number] Medical Record Treating RN: [name] [id number] Number: Other Clinician: 1934/10/23 (82 y.o. Treating Glenden Rossell, [place] Date of Birth/Sex: Female) Physician/Extender: Carrie Primary Care [name] Physician: Referring Physician: Melina [name] in Treatment: 33 Active Problems ICD-10 Encounter Code Description Active Date Diagnosis L97.812 Non-pressure chronic ulcer of other part of right lower leg 04/30/2016 Yes with fat layer exposed I87.333 Chronic venous hypertension (idiopathic) with ulcer and 04/03/2016 Yes inflammation of bilateral lower extremity I89.0 Lymphedema, not elsewhere classified 04/03/2016 Yes G26.94 Chronic systolic (congestive) heart failure 04/03/2016 Yes L97.221 Non-pressure chronic ulcer of left calf limited to 07/03/2016 Yes breakdown of skin Inactive Problems Resolved Problems ICD-10 Code Description Active Date Resolved Date S81.812S Laceration without  foreign body, left lower leg, sequela 04/03/2016 04/03/2016 Electronic Signature(s) Signed: 07/17/2016 7:43:54 AM By: Carrie Ham MD [name] ([id number]) Entered By: [name] on 07/16/2016 14:29:02 [name] ([id number]) -------------------------------------------------------------------------------- Progress Note Details [name] Date of Service: 07/16/2016 1:30 PM Patient Name: C. Patient Account Number: [id number] Medical Record Treating RN: [name] [id number] Number: Other Clinician: [birth year] (82 y.o. Treating Rajveer Handler, [place] Date of Birth/Sex: Female) Physician/Extender: Carrie Primary Care [name] Physician: Referring Physician: Melina [name] in Treatment: 33 Subjective Chief Complaint Information obtained from Patient Patient here for reevaluation of her right lower externally ulcer History of Present Illness (HPI) 11/22/15; this is Rister is a 82 year old woman who lives at home on her own. According to the patient and her daughter was present she has had long-standing edema in her legs dating back many years. She also has a history of chronic systolic heart failure, atrial fibrillation and is status post mitral valve replacement. Last echocardiogram I see in [facility] showed an ejection fraction of 40-45% she is on Lasix 60 mg a day and spironolactone 25 mg a day. Her current problem began in December around Christmas time she developed a small hematoma in the medial part of her left leg which rapidly expanded to a very large hematoma that required surgical debridement. This situation was complicated by the fact that the patient is on long-standing Coumadin for mechanical heart valve. She went to the OR had this evacuated on January 4 /17. The wound has gradually improved however she has developed a small wounds around this area and more recently a wound on the right lateral leg. She is weeping edema fluid. The  patient is already been to see vascular surgery. It was recommended that she wear Unna boots, she did not tolerate this due to pain in the left leg. She was then prescribed Juzo stockings and really can't get these on herself although truthfully there is probably too much edema for a Juzo stockings currently. She is not a diabetic and has no history of PAD or claudication that I could elicit. She does not use the external compression pumps reliably. She comes today with notes from her primary physician and Dr. Ronalee [name] both recommending various forms of compression but the patient does not really complied with them. Has been using the external compression pumps with some regularity but certainly not daily on the right leg and this has helped. I also note that her daughter tells me the history that she does not sleep in bed at home. She has a hospital bed but with her legs up she finds this painful so she sleeps in the couch sitting up with her legs dependent. 11/29/15; the patient is arrives today accompanied by her son. He expresses satisfaction that she is maintained the  compression all week. 12/06/15; the patient has kept her Profore light wraps on, we have good edema control no major change in the wounds we have been using Aquacel 12/13/15; changed to RTD last week. One of the 3 wounds on her left medial leg is healed she has 2 remaining wounds here and one on the right lateral leg. 12/18/2015 -- the patient was at Dr. Nino Parsley office today and he was seeing her for an arterial study. While the wrap was being removed she had an inadvertent laceration of the left proximal anterior leg which bled quite profusely and a compression dressing was applied over this and the patient was here to get her Profore wraps done. I was asked to see the patient to make an assessment and treat appropriately. Carrie, [name] ([id number]) 12/26/15; the patient's injury on the left proximal leg and Steri-Strips  removed after soaking. There is an open area here. The original wounds to still open on the right lateral and left medial leg. 01/03/16 patient's injury on her proximal left leg looks quite good. Still small open area on the medial left leg which appears to be improving. The area on the right lateral leg still substantially open with no real improvement in wound depth. Her edema control is marginal with a Profore light. We have been using RTD for 3-4 weeks without any major change 01/10/16: wounds without s/s of infection. vascular results are pending regarding arterial studies. 01/17/16; patient comes in today complaining of severe pain however I think most of this is in the right hip not related to her wounds. She continues with a oval-shaped wound on the right lateral leg, trauma to the left anterior leg just below her tibial plateau. She has a smaller eschar on the left anterior leg. 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 The area which was a scissor injury on her left upper anterior lower leg is fully epithelialized. She only has a small open area remaining on the medial aspect of her left leg. The oval-shaped wound on the right mid lateral leg may be a bit smaller. Debrided of surface slough nonviable subcutaneous tissue. I had changed to collagen 2 weeks ago in an attempt to get this to close 01/31/16 all the patient's wounds on her left leg give healed. We have good edema control with bilateral Profore lites which she has been compliant with. She still has the oval-shaped wound on the right lateral calf allergic even this appears to be slowly improving. The patient has juxtalite stockings at home. She states she thinks she can put these on. She also has external  compression pumps at home although I think her compliance with this has not been good in the past. She complains today of edema in her thighs. Tells me she takes 40 mg of Lasix daily 02/21/16; only 1 small wound remains on the lateral aspect of the right calf. She is using Juzo stockings on the left leg although she complains about difficulty in applying them. She has external compression pumps at home 02/28/16; the small open wound on her right lateral calf is improved in terms of wound area. It appears that she has a wrap injury on the anterior aspect of the upper leg 03/06/16; the small open wound on  her right lateral calf has a very small open area remaining. The wrap injury on the anterior aspect of the upper leg also appears better. She arrives today in clinic with a history of dyspnea with minimal exertion starting with the last 2-4 days. She does not describe chest pain. Her son and our intake nurse think she has facial swelling. She has a history of an artificial mitral valve on Coumadin 03/13/16; the small wound on the right lateral calf is no better this week. The superior wrap injury anteriorly requires debridement of surface eschar and nonviable subcutaneous tissue. She has been to her primary doctor with regards to her dyspnea we identified last week. Per the patient's description her Lasix has been increased 03/21/2016 -- patient of Dr. Dellia Nims who could not keep her appointment yesterday but has come in today with the right lower leg looking good and this is the leg which has been treated in the recent past. However, her left lower extremity is extremely swollen, tender and edematous with redness and discoloration. 03/27/16; there is only 2 small areas remain on the right leg. The edema that was so concerning last week has come down however there is extensive bruising on the lateral left leg medial left foot suggesting that she lost some blood into the leg itself. I checked her  hemoglobin on 9/21 was 14.5. Her INR was over 6. Duplex ultrasound was negative for DVT 04/03/16 at this point in time today patient is actually doing substantially better in regard to the wound on the anterior right lower extremity. currently there is no slough or eschar noted and no evidence of erythema, discharge, or local infection. She is exhibiting no signs of systemic infection. She is tolerating the dressing changes as well as the wrapping at this point in time. 04/24/16; I have not seen Mrs. Potenza for 3 weeks. Apparently she was admitted to hospital for respiratory Carrie, [name]. ([id number]) issues/also apparently has had multiple compression fractures and had kyphoplasty. When she was last here she only had one small open area remaining on the left leg she was using juxtalite stockings. Her son is upset about restarting the Coumadin which she blames or the swelling in her legs. She is on Coumadin for prophylaxis with a chronic artificial heart valve. 04/30/16 at this point in time patient has been tolerating the 3 layer compression wrap as well as the calcium alginate. 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 With tthat being said she does have the continued wound of the left lower extremity as well as the right medial lower extremity. The right side is significantly smaller compared to the left but actually is slough covered. fortunately she has no significant tenderness at rest although with manipulation of the right location of the wound this is significantly tender. 05/07/16 for follow-up evaluation today both the patient's wounds appear to be significantly improved in size compared to last week. She is also having less pain at both locations currently. Her pain level at most is related to be a 1-2 out of 10. She does have some discharge but fortunately no evidence of infection at this point.she also continues to tolerate the compression wraps well. 05/14/16; the  patient's wound on the left leg actually looks as though it's on its way to closure. She is using collagen to this area. She has a small punched out area over the right medial calf. The cause of this is not really clear it has probably 0.4 cm of depth.  She has an IV in her right hand which she states is for IV fluid when she develops low sodiums her potassiums, the etiology of this is not clear 05-21-16 she presents today with continued ulcerations the right lower extremity, posterior aspect. she has been using Iodoflex and compression therapy to the area. She denies any new injuries or trauma. She does have 2 areas proximal to this ulceration of dry crust along with an area to her left posterior lower leg that is purple discolored area appears similar to how her current ulceration started, she denies any trauma or injury to the left posterior leg we will monitor all of these areas. 05/28/16; the patient is down to 1 small open area on the right medial mid calf. This is however larger and in 50% of the surface area deeper approximately 0.4 cm. The reason for this deterioration is unclear. I've gone ahead and done a culture of this area she also tells [place] today that she is short of breath. She is also noted a swelling on her lower left eyelid 06/05/16; the patient is 1 small open area on the right medial mid calf. This looks about the same as last week. The deep area, medial 50% of the wound looks about the same. CandS I did last week was negative. The entire area looks about the same 06/12/16; not much change in this over the course of the last week. Patient comes in today complaining of extreme back pain. Apparently she has either a kyphoplasty or vertebral plasty scheduled at [facility] radiology tomorrow. For this reason no debridement today. We have been using Iodoflex 06/19/16; patient arrived with the surface eschar from last week removed with a curet debrided of subcutaneous tissue. She  tolerates this reasonably well. She is still in a skilled facility. States her vertebral plasty last week is made her pain bearable 06/26/16; patient arrived with a new wound on the posterior aspect of her left calf. She notices 3 or 4 days ago but is not certain how this happened. She states she simply felt a stinging sensation and one day on her foot and then the next day on the back of her calf. 06/28/2016 -- Ms. Reubin Milan called urgently saying that the pain in her left foot was drastically worse over the last several hours and though she removed her compression wrap she wanted to be seen before the long holiday. 07/03/16; she came in to see Dr. Con Memos on 12/29 for pain in her left foot/heel. Nothing much was found at the time and she states that the pain is better but involved her whole foot. She has a wound on the lateral aspect of the left calf and the medial aspect of the right. The medial 1 looks as if it is just about healed however the left one still has considerable edema. 07/09/16; she arrives in clinic today with a dime-sized wound on the left posterior calf and a small linear area on the right medial calf. The area on the left was debrided with a #3 curet nonviable subcutaneous tissue, we are not making progress here. The area on the right was also debrided of surface eschar and nonviable subcutaneous tissue this has some depth to it. Carrie, [name] ([id number]) 07/16/16; she has a dime size wound on the left posterior calf that has some depth. This required debridement last week, although I did not feel the need to do this today. We used Iodoflex last week. Small area on the right also  was not debrided. This looks like it may be close to closing Objective Constitutional Sitting or standing Blood Pressure is within target range for patient.. Pulse regular and within target range for patient.Marland Kitchen Respirations regular, non-labored and within target range.. Temperature is normal and  within the target range for the patient.. Patient's appearance is neat and clean. Appears in no acute distress. Well nourished and well developed.. Vitals Time Taken: 1:17 PM, Height: 68 in, Weight: 178 lbs, BMI: 27.1, Temperature: 97.5 F, Pulse: 76 bpm, Respiratory Rate: 16 breaths/min, Blood Pressure: 121/60 mmHg. Eyes Conjunctivae clear. No discharge.Marland Kitchen Respiratory Respiratory effort is easy and symmetric bilaterally. Rate is normal at rest and on room air.. Cardiovascular Pedal pulses absent bilaterally are palpable but reduced. Patient has venous insufficiency however her edema is well controlled. Lymphatic Nonpalpable in the popliteal or inguinal area. Psychiatric No evidence of depression, anxiety, or agitation. Calm, cooperative, and communicative. Appropriate interactions and affect.. General Notes: Wound exam; the area over the left lower leg still has some surface necrotic material although most of this appears to be healthy granulation. No debridement was done on the right medial leg small linear wound with apparent healthy granulation as well. The patient has venous insufficiency bilaterally but her edema is controlled Integumentary (Hair, Skin) Wound #8 status is Open. Original cause of wound was Trauma. The wound is located on the Right,Medial Lower Leg. The wound measures 0.2cm length x 0.3cm width x 0.2cm depth; 0.047cm^2 area and 0.009cm^3 volume. There is fat exposed. There is no tunneling or undermining noted. There is a medium amount of serosanguineous drainage noted. The wound margin is distinct with the outline attached to the wound base. There is no granulation within the wound bed. There is a large (67-100%) amount of necrotic [name], [name] ([id number]) tissue within the wound bed including Adherent Slough. The periwound skin appearance exhibited: Dry/Scaly. Periwound temperature was noted as No Abnormality. The periwound has tenderness  on palpation. Wound #9 status is Open. Original cause of wound was Gradually Appeared. The wound is located on the Left,Posterior Lower Leg. The wound measures 2cm length x 1.7cm width x 0.2cm depth; 2.67cm^2 area and 0.534cm^3 volume. The wound is limited to skin breakdown. There is no tunneling or undermining noted. There is a large amount of serosanguineous drainage noted. The wound margin is flat and intact. There is small (1-33%) red granulation within the wound bed. There is a large (67-100%) amount of necrotic tissue within the wound bed including Adherent Slough. The periwound skin appearance exhibited: Moist. Periwound temperature was noted as No Abnormality. The periwound has tenderness on palpation. Assessment Active Problems ICD-10 L97.812 - Non-pressure chronic ulcer of other part of right lower leg with fat layer exposed I87.333 - Chronic venous hypertension (idiopathic) with ulcer and inflammation of bilateral lower extremity I89.0 - Lymphedema, not elsewhere classified J88.41 - Chronic systolic (congestive) heart failure L97.221 - Non-pressure chronic ulcer of left calf limited to breakdown of skin Plan Wound Cleansing: Wound #8 Right,Medial Lower Leg: Clean wound with Normal Saline. Cleanse wound with mild soap and water May Shower, gently pat wound dry prior to applying new dressing. Wound #9 Left,Posterior Lower Leg: Clean wound with Normal Saline. Cleanse wound with mild soap and water May Shower, gently pat wound dry prior to applying new dressing. Anesthetic: Wound #8 Right,Medial Lower Leg: Topical Lidocaine 4% cream applied to wound bed prior to debridement - for clinic use Wound #9 Left,Posterior Lower Leg: Topical Lidocaine 4% cream applied to wound bed  prior to debridement - for clinic use Primary Wound Dressing: Wound #8 Right,Medial Lower Leg: Iodoflex [name], [name] ([id number]) Wound #9 Left,Posterior Lower Leg: Iodoflex Secondary  Dressing: Wound #8 Right,Medial Lower Leg: ABD pad Dry Gauze Wound #9 Left,Posterior Lower Leg: ABD pad Dry Gauze Dressing Change Frequency: Wound #8 Right,Medial Lower Leg: Other: - SNF change it Fridays and pt is has it changed at wound care clinic on Wednesdays. Wound #9 Left,Posterior Lower Leg: Other: - SNF change it Fridays and pt is has it changed at wound care clinic on Wednesdays. Follow-up Appointments: Wound #8 Right,Medial Lower Leg: Return Appointment in 1 week. Wound #9 Left,Posterior Lower Leg: Return Appointment in 1 week. Edema Control: Wound #8 Right,Medial Lower Leg: Kerlix and Coban - Bilateral Wound #9 Left,Posterior Lower Leg: Kerlix and Coban - Bilateral Additional Orders / Instructions: Wound #8 Right,Medial Lower Leg: Increase protein intake. Wound #9 Left,Posterior Lower Leg: Increase protein intake. We will continue with iodoflex to both wound areas/ABD/kerlix and coban Electronic Signature(s) Signed: 07/19/2016 5:08:47 PM By: Gretta Cool RN, BSN, Kim RN, BSN Signed: 07/23/2016 8:15:17 AM By: Carrie Ham MD Previous Signature: 07/17/2016 7:43:54 AM Version By: Carrie Ham MD Entered By: Gretta Cool RN, BSN, Kim on 07/19/2016 17:08:47 Swatek, [name] ([id number]) -------------------------------------------------------------------------------- SuperBill Details Patient Name: [name], [name] Date of Service: 07/16/2016 Medical Record Patient Account Number: [id number] Number: Treating RN: [name] [birth year] [name] (81 y.o. Other Clinician: Date of Birth/Sex: Female) Treating Ura Hausen Primary Care Physician/Extender: Claudette Laws Physician: Suella Grove in Treatment: 33 Referring Physician: Emily [name] Diagnosis Coding ICD-10 Codes Code Description [phone] Non-pressure chronic ulcer of other part of right lower leg with fat layer exposed Chronic venous hypertension (idiopathic) with ulcer and inflammation of bilateral  lower I87.333 extremity I89.0 Lymphedema, not elsewhere classified Q11.94 Chronic systolic (congestive) heart failure L97.221 Non-pressure chronic ulcer of left calf limited to breakdown of skin Facility Procedures CPT4 Code: [id number] Description: 81856 - WOUND CARE VISIT-LEV 5 EST PT Modifier: Quantity: 1 Physician Procedures CPT4: Description Modifier Quantity Code [id number] - WC PHYS LEVEL 3 - EST PT 1 ICD-10 Description Diagnosis L97.812 Non-pressure chronic ulcer of other part of right lower leg with fat layer exposed L97.221 Non-pressure chronic ulcer of left calf  limited to breakdown of skin Electronic Signature(s) Signed: 07/17/2016 7:43:54 AM By: Carrie Ham MD Signed: 07/22/2016 8:01:02 AM By: [name] Entered By: [name] on 07/16/2016 15:18:14

## 2016-07-24 LAB — PROTIME-INR
INR: 1.94
Prothrombin Time: 22.4 seconds — ABNORMAL HIGH (ref 11.4–15.2)

## 2016-07-24 NOTE — Progress Notes (Signed)
[name] ([id number]) Visit Report for 07/23/2016 Chief Complaint Document Details Patient Name: [name], [name]. Date of Service: 07/23/2016 2:30 PM Medical Record Patient Account Number: [id number] Number: Treating RN: [name] Oct 10, 1934 (81 y.o. Other Clinician: Date of Birth/Sex: Female) Treating [name], [name] Primary Care Provider: [name] Provider/Extender: G Referring Provider: [name] in Treatment: 34 Information Obtained from: Patient Chief Complaint Patient here for reevaluation of her right lower externally ulcer Electronic Signature(s) Signed: 07/23/2016 4:44:13 PM By: [name] MD Entered By: [name] on 07/23/2016 14:54:49 Westra, [name] ([id number]) -------------------------------------------------------------------------------- Debridement Details Patient Name: Carrie Mulligan C. Date of Service: 07/23/2016 2:30 PM Medical Record Patient Account Number: [id number] Number: Treating RN: [name] [birth year] (81 y.o. Other Clinician: Date of Birth/Sex: Female) Treating [name], [name] Primary Care Provider: [name] Provider/Extender: G Referring Provider: [name] in Treatment: 34 Debridement Performed for Wound #9 Left,Posterior Lower Leg Assessment: Performed By: Physician [name], MD Debridement: Debridement Pre-procedure Yes - 14:47 Verification/Time Out Taken: Start Time: 14:48 Pain Control: Lidocaine 4% Topical Solution Level: Skin/Subcutaneous Tissue Total Area Debrided (L x 2 (cm) x 1.7 (cm) = 3.4 (cm) W): Tissue and other Viable, Non-Viable, Exudate, Fibrin/Slough, Subcutaneous material debrided: Instrument: Curette Bleeding: Minimum Hemostasis Achieved: Pressure End Time: 14:50 Procedural Pain: 0 Post Procedural Pain: 0 Response to Treatment: Procedure was tolerated well Post Debridement Measurements of Total Wound Length: (cm)  2 Width: (cm) 1.7 Depth: (cm) 0.2 Volume: (cm) 0.534 Character of Wound/Ulcer Post Requires Further Debridement Debridement: Severity of Tissue Post Debridement: Fat layer exposed Post Procedure Diagnosis Same as Pre-procedure Electronic Signature(s) Signed: 07/23/2016 3:17:24 PM By: [name] Signed: 07/23/2016 4:44:13 PM By: [name] MD MAREA, REASNER ([id number]) Entered By: [name] on 07/23/2016 14:54:26 Hoes, [name] ([id number]) -------------------------------------------------------------------------------- HPI Details Patient Name: Carrie Mulligan C. Date of Service: 07/23/2016 2:30 PM Medical Record Patient Account Number: [id number] Number: Treating RN: [name] August 23, 1934 (81 y.o. Other Clinician: Date of Birth/Sex: Female) Treating [name], [name] Primary Care Provider: [name] Provider/Extender: G Referring Provider: [name] in Treatment: 34 History of Present Illness HPI Description: 11/22/15; this is [name] is a 82 year old woman who lives at home on her own. According to the patient and her daughter was present she has had long-standing edema in her legs dating back many years. She also has a history of chronic systolic heart failure, atrial fibrillation and is status post mitral valve replacement. Last echocardiogram I see in [facility] showed an ejection fraction of 40-45% she is on Lasix 60 mg a day and spironolactone 25 mg a day. Her current problem began in December around Christmas time she developed a small hematoma in the medial part of her left leg which rapidly expanded to a very large hematoma that required surgical debridement. This situation was complicated by the fact that the patient is on long-standing Coumadin for mechanical heart valve. She went to the OR had this evacuated on January 4 /17. The wound has gradually improved however she has developed a small wounds around  this area and more recently a wound on the right lateral leg. She is weeping edema fluid. The patient is already been to see vascular surgery. It was recommended that she wear Unna boots, she did not tolerate this due to pain in the left leg. She was then prescribed Juzo stockings and really can't get these on herself although truthfully there is probably too much edema for a Juzo stockings  currently. She is not a diabetic and has no history of PAD or claudication that I could elicit. She does not use the external compression pumps reliably. She comes today with notes from her primary physician and Dr. Ronalee Belts both recommending various forms of compression but the patient does not really complied with them. Has been using the external compression pumps with some regularity but certainly not daily on the right leg and this has helped. I also note that her daughter tells me the history that she does not sleep in bed at home. She has a hospital bed but with her legs up she finds this painful so she sleeps in the couch sitting up with her legs dependent. 11/29/15; the patient is arrives today accompanied by her son. He expresses satisfaction that she is maintained the compression all week. 12/06/15; the patient has kept her Profore light wraps on, we have good edema control no major change in the wounds we have been using Aquacel 12/13/15; changed to RTD last week. One of the 3 wounds on her left medial leg is healed she has 2 remaining wounds here and one on the right lateral leg. 12/18/2015 -- the patient was at Dr. Nino Parsley office today and he was seeing her for an arterial study. While the wrap was being removed she had an inadvertent laceration of the left proximal anterior leg which bled quite profusely and a compression dressing was applied over this and the patient was here to get her Profore wraps done. I was asked to see the patient to make an assessment and treat appropriately. 12/26/15; the  patient's injury on the left proximal leg and Steri-Strips removed after soaking. There is an open area here. The original wounds to still open on the right lateral and left medial leg. 01/03/16 patient's injury on her proximal left leg looks quite good. Still small open area on the medial left leg which appears to be improving. The area on the right lateral leg still substantially open with no real improvement in wound depth. Her edema control is marginal with a Profore light. We have been using RTD for 3-4 weeks without any major change 01/10/16: wounds without s/s of infection. vascular results are pending regarding arterial studies. MAHAGONY, GRIEB ([id number]) 01/17/16; patient comes in today complaining of severe pain however I think most of this is in the right hip not related to her wounds. She continues with a oval-shaped wound on the right lateral leg, trauma to the left anterior leg just below her tibial plateau. She has a smaller eschar on the left anterior leg. She is being using Prisma however she informs [place] today that she is actually allergic to silver, nose this from a previous application at [facility] some years ago 01/24/16; edema is not so well controlled today, I think I reduced her to [place] bilaterally last week. The area which was a scissor injury on her left upper anterior lower leg is fully epithelialized. She only has a small open area remaining on the medial aspect of her left leg. The oval-shaped wound on the right mid lateral leg may be a bit smaller. Debrided of surface slough nonviable subcutaneous tissue. I had changed to collagen 2 weeks ago in an attempt to get this to close 01/31/16 all the patient's wounds on her left leg give healed. We have good edema control with bilateral Profore lites which she has been compliant with. She still has the oval-shaped wound on the right lateral calf allergic even  this appears to be slowly improving. The patient has juxtalite  stockings at home. She states she thinks she can put these on. She also has external compression pumps at home although I think her compliance with this has not been good in the past. She complains today of edema in her thighs. Tells me she takes 40 mg of Lasix daily 02/21/16; only 1 small wound remains on the lateral aspect of the right calf. She is using Juzo stockings on the left leg although she complains about difficulty in applying them. She has external compression pumps at home 02/28/16; the small open wound on her right lateral calf is improved in terms of wound area. It appears that she has a wrap injury on the anterior aspect of the upper leg 03/06/16; the small open wound on her right lateral calf has a very small open area remaining. The wrap injury on the anterior aspect of the upper leg also appears better. She arrives today in clinic with a history of dyspnea with minimal exertion starting with the last 2-4 days. She does not describe chest pain. Her son and our intake nurse think she has facial swelling. She has a history of an artificial mitral valve on Coumadin 03/13/16; the small wound on the right lateral calf is no better this week. The superior wrap injury anteriorly requires debridement of surface eschar and nonviable subcutaneous tissue. She has been to her primary doctor with regards to her dyspnea we identified last week. Per the patient's description her Lasix has been increased 03/21/2016 -- patient of Dr. Dellia Nims who could not keep her appointment yesterday but has come in today with the right lower leg looking good and this is the leg which has been treated in the recent past. However, her left lower extremity is extremely swollen, tender and edematous with redness and discoloration. 03/27/16; there is only 2 small areas remain on the right leg. The edema that was so concerning last week has come down however there is extensive bruising on the lateral left leg medial  left foot suggesting that she lost some blood into the leg itself. I checked her hemoglobin on 9/21 was 14.5. Her INR was over 6. Duplex ultrasound was negative for DVT 04/03/16 at this point in time today patient is actually doing substantially better in regard to the wound on the anterior right lower extremity. currently there is no slough or eschar noted and no evidence of erythema, discharge, or local infection. She is exhibiting no signs of systemic infection. She is tolerating the dressing changes as well as the wrapping at this point in time. 04/24/16; I have not seen Mrs. Darrow for 3 weeks. Apparently she was admitted to hospital for respiratory issues/also apparently has had multiple compression fractures and had kyphoplasty. When she was last here she only had one small open area remaining on the left leg she was using juxtalite stockings. Her son is upset about restarting the Coumadin which she blames or the swelling in her legs. She is on Coumadin for prophylaxis with a chronic artificial heart valve. 04/30/16 at this point in time patient has been tolerating the 3 layer compression wrap as well as the calcium alginate. 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 With tthat being said she does have the Green[place], [place]. ([id number]) continued wound of the left lower extremity as well as the right medial lower extremity. The right side is significantly smaller compared to the left but actually is slough covered. fortunately she  has no significant tenderness at rest although with manipulation of the right location of the wound this is significantly tender. 05/07/16 for follow-up evaluation today both the patient's wounds appear to be significantly improved in size compared to last week. She is also having less pain at both locations currently. Her pain level at most is related to be a 1-2 out of 10. She does have some discharge but fortunately no evidence of infection at  this point.she also continues to tolerate the compression wraps well. 05/14/16; the patient's wound on the left leg actually looks as though it's on its way to closure. She is using collagen to this area. She has a small punched out area over the right medial calf. The cause of this is not really clear it has probably 0.4 cm of depth. She has an IV in her right hand which she states is for IV fluid when she develops low sodiums her potassiums, the etiology of this is not clear 05-21-16 she presents today with continued ulcerations the right lower extremity, posterior aspect. she has been using Iodoflex and compression therapy to the area. She denies any new injuries or trauma. She does have 2 areas proximal to this ulceration of dry crust along with an area to her left posterior lower leg that is purple discolored area appears similar to how her current ulceration started, she denies any trauma or injury to the left posterior leg we will monitor all of these areas. 05/28/16; the patient is down to 1 small open area on the right medial mid calf. This is however larger and in 50% of the surface area deeper approximately 0.4 cm. The reason for this deterioration is unclear. I've gone ahead and done a culture of this area she also tells [place] today that she is short of breath. She is also noted a swelling on her lower left eyelid 06/05/16; the patient is 1 small open area on the right medial mid calf. This looks about the same as last week. The deep area, medial 50% of the wound looks about the same. CandS I did last week was negative. The entire area looks about the same 06/12/16; not much change in this over the course of the last week. Patient comes in today complaining of extreme back pain. Apparently she has either a kyphoplasty or vertebral plasty scheduled at [facility] radiology tomorrow. For this reason no debridement today. We have been using Iodoflex 06/19/16; patient arrived with the surface  eschar from last week removed with a curet debrided of subcutaneous tissue. She tolerates this reasonably well. She is still in a skilled facility. States her vertebral plasty last week is made her pain bearable 06/26/16; patient arrived with a new wound on the posterior aspect of her left calf. She notices 3 or 4 days ago but is not certain how this happened. She states she simply felt a stinging sensation and one day on her foot and then the next day on the back of her calf. 06/28/2016 -- Ms. Reubin Milan called urgently saying that the pain in her left foot was drastically worse over the last several hours and though she removed her compression wrap she wanted to be seen before the long holiday. 07/03/16; she came in to see Dr. Con Memos on 12/29 for pain in her left foot/heel. Nothing much was found at the time and she states that the pain is better but involved her whole foot. She has a wound on the lateral aspect of the left calf  and the medial aspect of the right. The medial 1 looks as if it is just about healed however the left one still has considerable edema. 07/09/16; she arrives in clinic today with a dime-sized wound on the left posterior calf and a small linear area on the right medial calf. The area on the left was debrided with a #3 curet nonviable subcutaneous tissue, we are not making progress here. The area on the right was also debrided of surface eschar and nonviable subcutaneous tissue this has some depth to it. 07/16/16; she has a dime size wound on the left posterior calf that has some depth. This required debridement last week, although I did not feel the need to do this today. We used Iodoflex last week. Small area on the right also was not debrided. This looks like it may be close to closing 07/23/16; using Iodoflex to both wound areas. The area on the right posterior/medial calf is just about closed. She still has a fairly substantial wound on the left posterior leg which is still  problematic FARYN, SIEG ([id number]) Electronic Signature(s) Signed: 07/23/2016 4:44:13 PM By: [name] MD Entered By: [name] on 07/23/2016 14:55:34 Pileggi, [name] ([id number]) -------------------------------------------------------------------------------- Physical Exam Details Patient Name: ANDIS, Coralie C. Date of Service: 07/23/2016 2:30 PM Medical Record Patient Account Number: [id number] Number: Treating RN: [name] 06/01/1935 (81 y.o. Other Clinician: Date of Birth/Sex: Female) Treating [name], [name] Primary Care Provider: [name] Provider/Extender: G Referring Provider: [name] in Treatment: 34 Constitutional Sitting or standing Blood Pressure is within target range for patient.. Pulse regular and within target range for patient.Marland Kitchen Respirations regular, non-labored and within target range.. Temperature is normal and within the target range for the patient.. Patient's appearance is neat and clean. Appears in no acute distress. Well nourished and well developed.. Cardiovascular Pedal pulses palpable and strong bilaterally.. Notes Wound exam; the area on the posterior left leg had a lot of necrotic material over the surface which required debridement with a #3 curet hemostasis with direct pressure. The area on the right medial/posterior leg which was a small linear wound is only open on the medial aspect. I'm hopeful this will be closed by next week Electronic Signature(s) Signed: 07/23/2016 4:44:13 PM By: [name] MD Entered By: [name] on 07/23/2016 14:59:01 Melching, [name] ([id number]) -------------------------------------------------------------------------------- Physician Orders Details Patient Name: Carrie Mulligan C. Date of Service: 07/23/2016 2:30 PM Medical Record Patient Account Number: [id number] Number: Treating RN: [name] December 24, 1934 (81 y.o. Other  Clinician: Date of Birth/Sex: Female) Treating [name], [name] Primary Care Provider: [name] Provider/Extender: G Referring Provider: [name] in Treatment: 34 Verbal / Phone Orders: Yes Clinician: Carolyne Fiscal, Debi Read Back and Verified: Yes Diagnosis Coding Wound Cleansing Wound #8 Right,Medial Lower Leg o Clean wound with Normal Saline. o Cleanse wound with mild soap and water o May Shower, gently pat wound dry prior to applying new dressing. Wound #9 Left,Posterior Lower Leg o Clean wound with Normal Saline. o Cleanse wound with mild soap and water o May Shower, gently pat wound dry prior to applying new dressing. Anesthetic Wound #8 Right,Medial Lower Leg o Topical Lidocaine 4% cream applied to wound bed prior to debridement - for clinic use Wound #9 Left,Posterior Lower Leg o Topical Lidocaine 4% cream applied to wound bed prior to debridement - for clinic use Primary Wound Dressing Wound #8 Right,Medial Lower Leg o Promogran - moisten with saline Wound #9 Left,Posterior Lower Leg o Iodoflex  Secondary Dressing Wound #8 Right,Medial Lower Leg o ABD pad o Dry Gauze Wound #9 Left,Posterior Lower Leg o ABD pad o Dry Gauze Brinkmeier, Rebecka C. ([id number]) Dressing Change Frequency Wound #8 Right,Medial Lower Leg o Other: - SNF change it Fridays and pt is has it changed at wound care clinic on Wednesdays. Wound #9 Left,Posterior Lower Leg o Other: - SNF change it Fridays and pt is has it changed at wound care clinic on Wednesdays. Follow-up Appointments Wound #8 Right,Medial Lower Leg o Return Appointment in 1 week. Wound #9 Left,Posterior Lower Leg o Return Appointment in 1 week. Edema Control Wound #8 Right,Medial Lower Leg o Kerlix and Coban - Bilateral Wound #9 Left,Posterior Lower Leg o Kerlix and Coban - Bilateral Additional Orders / Instructions Wound #8 Right,Medial Lower Leg o Increase protein  intake. Wound #9 Left,Posterior Lower Leg o Increase protein intake. Electronic Signature(s) Signed: 07/23/2016 3:17:24 PM By: [name] Signed: 07/23/2016 4:44:13 PM By: [name] MD Entered By: [name] on 07/23/2016 14:51:12 Winstanley, [name] ([id number]) -------------------------------------------------------------------------------- Problem List Details Patient Name: JOCELYNN, GIOFFRE C. Date of Service: 07/23/2016 2:30 PM Medical Record Patient Account Number: [id number] Number: Treating RN: [name] 1934-10-27 (81 y.o. Other Clinician: Date of Birth/Sex: Female) Treating [name], [name] Primary Care Provider: [name] Provider/Extender: G Referring Provider: [name] in Treatment: 34 Active Problems ICD-10 Encounter Code Description Active Date Diagnosis L97.812 Non-pressure chronic ulcer of other part of right lower leg 04/30/2016 Yes with fat layer exposed I87.333 Chronic venous hypertension (idiopathic) with ulcer and 04/03/2016 Yes inflammation of bilateral lower extremity I89.0 Lymphedema, not elsewhere classified 04/03/2016 Yes A26.33 Chronic systolic (congestive) heart failure 04/03/2016 Yes L97.221 Non-pressure chronic ulcer of left calf limited to 07/03/2016 Yes breakdown of skin Inactive Problems Resolved Problems ICD-10 Code Description Active Date Resolved Date S81.812S Laceration without foreign body, left lower leg, sequela 04/03/2016 04/03/2016 Electronic Signature(s) Signed: 07/23/2016 4:44:13 PM By: [name] MD Entered By: [name] on 07/23/2016 14:53:50 Steinhart, [name] ([id number]) [address], [place] ([id number]) -------------------------------------------------------------------------------- Progress Note Details Patient Name: Carrie Mulligan C. Date of Service: 07/23/2016 2:30 PM Medical Record Patient Account Number: [id number] Number: Treating RN: [name] 20-Mar-1935 (81 y.o. Other Clinician: Date of Birth/Sex: Female) Treating [name], [name] Primary Care Provider: [name] Provider/Extender: G Referring Provider: [name] in Treatment: 34 Subjective Chief Complaint Information obtained from Patient Patient here for reevaluation of her right lower externally ulcer History of Present Illness (HPI) 11/22/15; this is Weinheimer is a 82 year old woman who lives at home on her own. According to the patient and her daughter was present she has had long-standing edema in her legs dating back many years. She also has a history of chronic systolic heart failure, atrial fibrillation and is status post mitral valve replacement. Last echocardiogram I see in [facility] showed an ejection fraction of 40-45% she is on Lasix 60 mg a day and spironolactone 25 mg a day. Her current problem began in December around Christmas time she developed a small hematoma in the medial part of her left leg which rapidly expanded to a very large hematoma that required surgical debridement. This situation was complicated by the fact that the patient is on long-standing Coumadin for mechanical heart valve. She went to the OR had this evacuated on January 4 /17. The wound has gradually improved however she has developed a small wounds around this area and more recently a wound on the right lateral leg. She is weeping  edema fluid. The patient is already been to see vascular surgery. It was recommended that she wear Unna boots, she did not tolerate this due to pain in the left leg. She was then prescribed Juzo stockings and really can't get these on herself although truthfully there is probably too much edema for a Juzo stockings currently. She is not a diabetic and has no history of PAD or claudication that I could elicit. She does not use the external compression pumps reliably. She comes today with notes from her primary physician and Dr. Ronalee Belts both  recommending various forms of compression but the patient does not really complied with them. Has been using the external compression pumps with some regularity but certainly not daily on the right leg and this has helped. I also note that her daughter tells me the history that she does not sleep in bed at home. She has a hospital bed but with her legs up she finds this painful so she sleeps in the couch sitting up with her legs dependent. 11/29/15; the patient is arrives today accompanied by her son. He expresses satisfaction that she is maintained the compression all week. 12/06/15; the patient has kept her Profore light wraps on, we have good edema control no major change in the wounds we have been using Aquacel 12/13/15; changed to RTD last week. One of the 3 wounds on her left medial leg is healed she has 2 remaining wounds here and one on the right lateral leg. 12/18/2015 -- the patient was at Dr. Nino Parsley office today and he was seeing her for an arterial study. While the wrap was being removed she had an inadvertent laceration of the left proximal anterior leg which bled quite profusely and a compression dressing was applied over this and the patient was here to get her Profore wraps done. I was asked to see the patient to make an assessment and treat appropriately. 12/26/15; the patient's injury on the left proximal leg and Steri-Strips removed after soaking. There is an open area here. The original wounds to still open on the right lateral and left medial leg. KERIANNE, GURR ([id number]) 01/03/16 patient's injury on her proximal left leg looks quite good. Still small open area on the medial left leg which appears to be improving. The area on the right lateral leg still substantially open with no real improvement in wound depth. Her edema control is marginal with a Profore light. We have been using RTD for 3-4 weeks without any major change 01/10/16: wounds without s/s of infection.  vascular results are pending regarding arterial studies. 01/17/16; patient comes in today complaining of severe pain however I think most of this is in the right hip not related to her wounds. She continues with a oval-shaped wound on the right lateral leg, trauma to the left anterior leg just below her tibial plateau. She has a smaller eschar on the left anterior leg. 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 The area which was a scissor injury on her left upper anterior lower leg is fully epithelialized. She only has a small open area remaining on the medial aspect of her left leg. The oval-shaped wound on the right mid lateral leg may be a bit smaller. Debrided of surface slough nonviable  subcutaneous tissue. I had changed to collagen 2 weeks ago in an attempt to get this to close 01/31/16 all the patient's wounds on her left leg give healed. We have good edema control with bilateral Profore lites which she has been compliant with. She still has the oval-shaped wound on the right lateral calf allergic even this appears to be slowly improving. The patient has juxtalite stockings at home. She states she thinks she can put these on. She also has external compression pumps at home although I think her compliance with this has not been good in the past. She complains today of edema in her thighs. Tells me she takes 40 mg of Lasix daily 02/21/16; only 1 small wound remains on the lateral aspect of the right calf. She is using Juzo stockings on the left leg although she complains about difficulty in applying them. She has external compression pumps at home 02/28/16; the small open wound on her right lateral calf is improved in terms of wound area. It appears that she has a wrap  injury on the anterior aspect of the upper leg 03/06/16; the small open wound on her right lateral calf has a very small open area remaining. The wrap injury on the anterior aspect of the upper leg also appears better. She arrives today in clinic with a history of dyspnea with minimal exertion starting with the last 2-4 days. She does not describe chest pain. Her son and our intake nurse think she has facial swelling. She has a history of an artificial mitral valve on Coumadin 03/13/16; the small wound on the right lateral calf is no better this week. The superior wrap injury anteriorly requires debridement of surface eschar and nonviable subcutaneous tissue. She has been to her primary doctor with regards to her dyspnea we identified last week. Per the patient's description her Lasix has been increased 03/21/2016 -- patient of Dr. Dellia Nims who could not keep her appointment yesterday but has come in today with the right lower leg looking good and this is the leg which has been treated in the recent past. However, her left lower extremity is extremely swollen, tender and edematous with redness and discoloration. 03/27/16; there is only 2 small areas remain on the right leg. The edema that was so concerning last week has come down however there is extensive bruising on the lateral left leg medial left foot suggesting that she lost some blood into the leg itself. I checked her hemoglobin on 9/21 was 14.5. Her INR was over 6. Duplex ultrasound was negative for DVT 04/03/16 at this point in time today patient is actually doing substantially better in regard to the wound on the anterior right lower extremity. currently there is no slough or eschar noted and no evidence of erythema, discharge, or local infection. She is exhibiting no signs of systemic infection. She is tolerating the dressing changes as well as the wrapping at this point in time. 04/24/16; I have not seen Mrs. Bjelland for 3 weeks. Apparently  she was admitted to hospital for respiratory issues/also apparently has had multiple compression fractures and had kyphoplasty. When she was last here she only had one small open area remaining on the left leg she was using juxtalite stockings. Her son CARLA, RASHAD ([id number]) is upset about restarting the Coumadin which she blames or the swelling in her legs. She is on Coumadin for prophylaxis with a chronic artificial heart valve. 04/30/16 at this point in time patient has been tolerating the 3  layer compression wrap as well as the calcium alginate. 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 With tthat being said she does have the continued wound of the left lower extremity as well as the right medial lower extremity. The right side is significantly smaller compared to the left but actually is slough covered. fortunately she has no significant tenderness at rest although with manipulation of the right location of the wound this is significantly tender. 05/07/16 for follow-up evaluation today both the patient's wounds appear to be significantly improved in size compared to last week. She is also having less pain at both locations currently. Her pain level at most is related to be a 1-2 out of 10. She does have some discharge but fortunately no evidence of infection at this point.she also continues to tolerate the compression wraps well. 05/14/16; the patient's wound on the left leg actually looks as though it's on its way to closure. She is using collagen to this area. She has a small punched out area over the right medial calf. The cause of this is not really clear it has probably 0.4 cm of depth. She has an IV in her right hand which she states is for IV fluid when she develops low sodiums her potassiums, the etiology of this is not clear 05-21-16 she presents today with continued ulcerations the right lower extremity, posterior aspect. she has been using Iodoflex and compression  therapy to the area. She denies any new injuries or trauma. She does have 2 areas proximal to this ulceration of dry crust along with an area to her left posterior lower leg that is purple discolored area appears similar to how her current ulceration started, she denies any trauma or injury to the left posterior leg we will monitor all of these areas. 05/28/16; the patient is down to 1 small open area on the right medial mid calf. This is however larger and in 50% of the surface area deeper approximately 0.4 cm. The reason for this deterioration is unclear. I've gone ahead and done a culture of this area she also tells [place] today that she is short of breath. She is also noted a swelling on her lower left eyelid 06/05/16; the patient is 1 small open area on the right medial mid calf. This looks about the same as last week. The deep area, medial 50% of the wound looks about the same. CandS I did last week was negative. The entire area looks about the same 06/12/16; not much change in this over the course of the last week. Patient comes in today complaining of extreme back pain. Apparently she has either a kyphoplasty or vertebral plasty scheduled at [facility] radiology tomorrow. For this reason no debridement today. We have been using Iodoflex 06/19/16; patient arrived with the surface eschar from last week removed with a curet debrided of subcutaneous tissue. She tolerates this reasonably well. She is still in a skilled facility. States her vertebral plasty last week is made her pain bearable 06/26/16; patient arrived with a new wound on the posterior aspect of her left calf. She notices 3 or 4 days ago but is not certain how this happened. She states she simply felt a stinging sensation and one day on her foot and then the next day on the back of her calf. 06/28/2016 -- Ms. Reubin Milan called urgently saying that the pain in her left foot was drastically worse over the last several hours and though she  removed her compression wrap  she wanted to be seen before the long holiday. 07/03/16; she came in to see Dr. Con Memos on 12/29 for pain in her left foot/heel. Nothing much was found at the time and she states that the pain is better but involved her whole foot. She has a wound on the lateral aspect of the left calf and the medial aspect of the right. The medial 1 looks as if it is just about healed however the left one still has considerable edema. 07/09/16; she arrives in clinic today with a dime-sized wound on the left posterior calf and a small linear area on the right medial calf. The area on the left was debrided with a #3 curet nonviable subcutaneous tissue, we are not making progress here. The area on the right was also debrided of surface eschar and nonviable subcutaneous tissue this has some depth to it. 07/16/16; she has a dime size wound on the left posterior calf that has some depth. This required debridement last week, although I did not feel the need to do this today. We used Iodoflex last week. Small Rosell, Mireyah C. ([id number]) area on the right also was not debrided. This looks like it may be close to closing 07/23/16; using Iodoflex to both wound areas. The area on the right posterior/medial calf is just about closed. She still has a fairly substantial wound on the left posterior leg which is still problematic Objective Constitutional Sitting or standing Blood Pressure is within target range for patient.. Pulse regular and within target range for patient.Marland Kitchen Respirations regular, non-labored and within target range.. Temperature is normal and within the target range for the patient.. Patient's appearance is neat and clean. Appears in no acute distress. Well nourished and well developed.. Vitals Time Taken: 2:26 PM, Height: 68 in, Weight: 178 lbs, BMI: 27.1, Pulse: 84 bpm, Respiratory Rate: 16 breaths/min, Blood Pressure: 116/76 mmHg. Cardiovascular Pedal pulses palpable and  strong bilaterally.. General Notes: Wound exam; the area on the posterior left leg had a lot of necrotic material over the surface which required debridement with a #3 curet hemostasis with direct pressure. The area on the right medial/posterior leg which was a small linear wound is only open on the medial aspect. I'm hopeful this will be closed by next week Integumentary (Hair, Skin) Wound #8 status is Open. Original cause of wound was Trauma. The wound is located on the Right,Medial Lower Leg. The wound measures 0.2cm length x 0.2cm width x 0.2cm depth; 0.031cm^2 area and 0.006cm^3 volume. There is Fat Layer (Subcutaneous Tissue) Exposed exposed. There is no tunneling or undermining noted. There is a medium amount of serosanguineous drainage noted. The wound margin is distinct with the outline attached to the wound base. There is no granulation within the wound bed. There is a large (67-100%) amount of necrotic tissue within the wound bed including Adherent Slough. The periwound skin appearance exhibited: Dry/Scaly. Periwound temperature was noted as No Abnormality. The periwound has tenderness on palpation. Wound #9 status is Open. Original cause of wound was Gradually Appeared. The wound is located on the Left,Posterior Lower Leg. The wound measures 2cm length x 1.7cm width x 0.2cm depth; 2.67cm^2 area and 0.534cm^3 volume. The wound is limited to skin breakdown. There is no tunneling or undermining noted. There is a large amount of serosanguineous drainage noted. The wound margin is flat and intact. There is small (1-33%) red granulation within the wound bed. There is a large (67-100%) amount of necrotic tissue within the wound bed including Adherent  Slough. Periwound temperature was noted as No Abnormality. The periwound has tenderness on palpation. CORTNY, BAMBACH ([id number]) Assessment Active Problems ICD-10 [phone] - Non-pressure chronic ulcer of other part of right lower leg  with fat layer exposed I87.333 - Chronic venous hypertension (idiopathic) with ulcer and inflammation of bilateral lower extremity I89.0 - Lymphedema, not elsewhere classified V61.60 - Chronic systolic (congestive) heart failure L97.221 - Non-pressure chronic ulcer of left calf limited to breakdown of skin Procedures Wound #9 Wound #9 is a Venous Leg Ulcer located on the Left,Posterior Lower Leg . There was a Skin/Subcutaneous Tissue Debridement ([id number]) debridement with total area of 3.4 sq cm performed by [name], MD. with the following instrument(s): Curette to remove Viable and Non-Viable tissue/material including Exudate, Fibrin/Slough, and Subcutaneous after achieving pain control using Lidocaine 4% Topical Solution. A time out was conducted at 14:47, prior to the start of the procedure. A Minimum amount of bleeding was controlled with Pressure. The procedure was tolerated well with a pain level of 0 throughout and a pain level of 0 following the procedure. Post Debridement Measurements: 2cm length x 1.7cm width x 0.2cm depth; 0.534cm^3 volume. Character of Wound/Ulcer Post Debridement requires further debridement. Severity of Tissue Post Debridement is: Fat layer exposed. Post procedure Diagnosis Wound #9: Same as Pre-Procedure Plan Wound Cleansing: Wound #8 Right,Medial Lower Leg: Clean wound with Normal Saline. Cleanse wound with mild soap and water May Shower, gently pat wound dry prior to applying new dressing. Wound #9 Left,Posterior Lower Leg: Clean wound with Normal Saline. Cleanse wound with mild soap and water May Shower, gently pat wound dry prior to applying new dressing. MARGUERITE, BARBA ([id number]) Anesthetic: Wound #8 Right,Medial Lower Leg: Topical Lidocaine 4% cream applied to wound bed prior to debridement - for clinic use Wound #9 Left,Posterior Lower Leg: Topical Lidocaine 4% cream applied to wound bed prior to debridement - for clinic  use Primary Wound Dressing: Wound #8 Right,Medial Lower Leg: Promogran - moisten with saline Wound #9 Left,Posterior Lower Leg: Iodoflex Secondary Dressing: Wound #8 Right,Medial Lower Leg: ABD pad Dry Gauze Wound #9 Left,Posterior Lower Leg: ABD pad Dry Gauze Dressing Change Frequency: Wound #8 Right,Medial Lower Leg: Other: - SNF change it Fridays and pt is has it changed at wound care clinic on Wednesdays. Wound #9 Left,Posterior Lower Leg: Other: - SNF change it Fridays and pt is has it changed at wound care clinic on Wednesdays. Follow-up Appointments: Wound #8 Right,Medial Lower Leg: Return Appointment in 1 week. Wound #9 Left,Posterior Lower Leg: Return Appointment in 1 week. Edema Control: Wound #8 Right,Medial Lower Leg: Kerlix and Coban - Bilateral Wound #9 Left,Posterior Lower Leg: Kerlix and Coban - Bilateral Additional Orders / Instructions: Wound #8 Right,Medial Lower Leg: Increase protein intake. Wound #9 Left,Posterior Lower Leg: Increase protein intake. Continue with iodoflex to the dime sized wound on the left Prisma on the right 2 layer compression ANONA, GIOVANNINI ([id number]) Electronic Signature(s) Signed: 07/23/2016 4:44:13 PM By: [name] MD Entered By: [name] on 07/23/2016 15:01:23 [place], [name] ([id number]) -------------------------------------------------------------------------------- SuperBill Details Patient Name: Carrie Mulligan C. Date of Service: 07/23/2016 Medical Record Patient Account Number: [id number] Number: Treating RN: [name] [birth year] (81 y.o. Other Clinician: Date of Birth/Sex: Female) Treating [name], [name] Primary Care Provider: [name] Provider/Extender: G Referring Provider: [name] in Treatment: 34 Diagnosis Coding ICD-10 Codes Code Description [phone] Non-pressure chronic ulcer of other part of right lower leg with fat layer exposed Chronic venous  hypertension (  idiopathic) with ulcer and inflammation of bilateral lower I87.333 extremity I89.0 Lymphedema, not elsewhere classified Z61.09 Chronic systolic (congestive) heart failure L97.221 Non-pressure chronic ulcer of left calf limited to breakdown of skin Facility Procedures CPT4 Code Description: [id number] 11042 - DEB SUBQ TISSUE 20 SQ CM/< ICD-10 Description Diagnosis L97.221 Non-pressure chronic ulcer of left calf limited to Modifier: breakdown of sk Quantity: 1 in Physician Procedures CPT4 Code Description: [id number] - WC PHYS SUBQ TISS 20 SQ CM ICD-10 Description Diagnosis L97.221 Non-pressure chronic ulcer of left calf limited to Modifier: breakdown of ski Quantity: 1 n Electronic Signature(s) Signed: 07/23/2016 4:44:13 PM By: [name] MD Entered By: [name] on 07/23/2016 15:01:58

## 2016-07-24 NOTE — Progress Notes (Signed)
TANICA, GAIGE ([id number]) Visit Report for 07/23/2016 Arrival Information Details Patient Name: RIMA, BLIZZARD. Date of Service: 07/23/2016 2:30 PM Medical Record Patient Account Number: [id number] Number: Treating RN: [name] [birth year] (82 y.o. Other Clinician: Date of Birth/Sex: Female) Treating [name], [name] Primary Care [place]: [name] Nohemi Nicklaus/Extender: G Referring Kyna Blahnik: [name] in Treatment: 34 Visit Information History Since Last Visit All ordered tests and consults were completed: No Patient Arrived: Wheel Chair Added or deleted any medications: No Arrival Time: 14:25 Any new allergies or adverse reactions: No Accompanied By: caregiver Had a fall or experienced change in No Transfer Assistance: EasyPivot Patient activities of daily living that may affect Lift risk of falls: Patient Identification Verified: Yes Signs or symptoms of abuse/neglect since last No Secondary Verification Process Yes visito Completed: Hospitalized since last visit: No Patient Requires Transmission- No Has Dressing in Place as Prescribed: Yes Based Precautions: Has Compression in Place as Prescribed: Yes Patient Has Alerts: Yes Pain Present Now: No Patient Alerts: Patient on Blood [place]!!! Electronic Signature(s) Signed: 07/23/2016 3:17:24 PM By: [name] Entered By: [name] on 07/23/2016 14:26:06 Leider, [name] ([id number]) -------------------------------------------------------------------------------- Lower Extremity Assessment Details Patient Name: [name] Date of Service: 07/23/2016 2:30 PM Medical Record Patient Account Number: [id number] Number: Treating RN: [name] 1935-04-29 (82 y.o. Other Clinician: Date of Birth/Sex: Female) Treating [name], [place] Primary Care Jolanda Mccann: [name] Aaliah Jorgenson/Extender: G Referring  Shalece Staffa: [name] in Treatment: 34 Edema Assessment Assessed: [Left: No] [Right: No] E[Left: dema] [Right: :] Calf Left: Right: Point of Measurement: 34 cm From Medial Instep 31.5 cm 30.8 cm Ankle Left: Right: Point of Measurement: 10 cm From Medial Instep 20.7 cm 22 cm Vascular Assessment Pulses: Dorsalis Pedis Palpable: [Left:Yes] [Right:Yes] Posterior Tibial Extremity colors, hair growth, and conditions: Extremity Color: [Left:Mottled] [Right:Mottled] Temperature of Extremity: [Left:Warm] [Right:Warm] Capillary Refill: [Left:< 3 seconds] [Right:< 3 seconds] Electronic Signature(s) Signed: 07/23/2016 3:17:24 PM By: [name] Entered By: [name] on 07/23/2016 14:55:28 [place], Ebelin CMarland Kitchen ([id number]) -------------------------------------------------------------------------------- Multi Wound Chart Details Patient Name: [name] Date of Service: 07/23/2016 2:30 PM Medical Record Patient Account Number: [id number] Number: Treating RN: [name] 1935/05/06 (82 y.o. Other Clinician: Date of Birth/Sex: Female) Treating [name], [name] Primary Care Karlea Mckibbin: [name] Kaina Orengo/Extender: G Referring Ashely Joshua: [name] in Treatment: 34 Vital Signs Height(in): 68 Pulse(bpm): 84 Weight(lbs): 178 Blood Pressure 116/76 (mmHg): Body Mass Index(BMI): 27 Temperature(F): Respiratory Rate 16 (breaths/min): Photos: [8:No Photos] [9:No Photos] [N/A:N/A] Wound Location: [8:Right Lower Leg - Medial] [9:Left Lower Leg - Posterior] [N/A:N/A] Wounding Event: [8:Trauma] [9:Gradually Appeared] [N/A:N/A] Primary Etiology: [8:Venous Leg Ulcer] [9:Venous Leg Ulcer] [N/A:N/A] Comorbid History: [8:Cataracts, Arrhythmia, Hypotension, Osteoarthritis] [9:Cataracts, Arrhythmia, Hypotension, Osteoarthritis] [N/A:N/A] Date Acquired: [8:04/06/2016] [9:06/26/2016] [N/A:N/A] Weeks of Treatment: [8:12] [9:3] [N/A:N/A] Wound Status:  [8:Open] [9:Open] [N/A:N/A] Measurements L x W x D 0.2x0.2x0.2 [9:2x1.7x0.2] [N/A:N/A] (cm) Area (cm) : [8:0.031] [9:2.67] [N/A:N/A] Volume (cm) : [8:0.006] [9:0.534] [N/A:N/A] % Reduction in Area: [8:85.90%] [9:54.70%] [N/A:N/A] % Reduction in Volume: 72.70% [9:9.30%] [N/A:N/A] Classification: [8:Full Thickness Without Exposed Support Structures] [9:Full Thickness Without Exposed Support Structures] [N/A:N/A] Exudate Amount: [8:Medium] [9:Large] [N/A:N/A] Exudate Type: [8:Serosanguineous] [9:Serosanguineous] [N/A:N/A] Exudate Color: [8:red, brown] [9:red, brown] [N/A:N/A] Wound Margin: [8:Distinct, outline attached] [9:Flat and Intact] [N/A:N/A] Granulation Amount: [8:None Present (0%)] [9:Small (1-33%)] [N/A:N/A] Granulation Quality: [8:N/A] [9:Red] [N/A:N/A] Necrotic Amount: [8:Large (67-100%)] [9:Large (67-100%)] [N/A:N/A] Exposed Structures: [8:Fat Layer (Subcutaneous Tissue) Exposed: Yes] [9:Fascia: No Fat Layer (Subcutaneous] [N/A:N/A]  Fascia: No Tissue) Exposed: No Tendon: No Tendon: No Muscle: No Muscle: No Joint: No Joint: No Bone: No Bone: No Limited to Skin Breakdown Epithelialization: Large (67-100%) None N/A Debridement: N/A Debridement ([id number] N/A [id number]) Pre-procedure N/A 14:47 N/A Verification/Time Out Taken: Pain Control: N/A Lidocaine 4% Topical N/A Solution Tissue Debrided: N/A Fibrin/Slough, Exudates, N/A Subcutaneous Level: N/A Skin/Subcutaneous N/A Tissue Debridement Area (sq N/A 3.4 N/A cm): Instrument: N/A Curette N/A Bleeding: N/A Minimum N/A Hemostasis Achieved: N/A Pressure N/A Procedural Pain: N/A 0 N/A Post Procedural Pain: N/A 0 N/A Debridement Treatment N/A Procedure was tolerated N/A Response: well Post Debridement N/A 2x1.7x0.2 N/A Measurements L x W x D (cm) Post Debridement N/A 0.534 N/A Volume: (cm) Periwound Skin Texture: No Abnormalities Noted No Abnormalities Noted N/A Periwound Skin Dry/Scaly: Yes No Abnormalities  Noted N/A Moisture: Periwound Skin Color: No Abnormalities Noted No Abnormalities Noted N/A Temperature: No Abnormality No Abnormality N/A Tenderness on Yes Yes N/A Palpation: Wound Preparation: Ulcer Cleansing: Ulcer Cleansing: N/A Rinsed/Irrigated with Rinsed/Irrigated with Saline Saline Topical Anesthetic Topical Anesthetic Applied: Other: lidocaine Applied: Other: lidociane 4% 4% Procedures Performed: N/A Debridement N/A Treatment Notes BETUL, BRISKY ([id number]) Electronic Signature(s) Signed: 07/23/2016 4:44:13 PM By: [name] MD Entered By: [name] on 07/23/2016 14:54:11 [place], [name] ([id number]) -------------------------------------------------------------------------------- Pain Assessment Details Patient Name: QUIARA, KILLIAN C. Date of Service: 07/23/2016 2:30 PM Medical Record Patient Account Number: [id number] Number: Treating RN: [name] 01-10-1935 (81 y.o. Other Clinician: Date of Birth/Sex: Female) Treating [name], [name] Primary Care Tamiyah Moulin: [name] Matilynn Dacey/Extender: G Referring Makynzi Eastland: [name] in Treatment: 34 Active Problems Location of Pain Severity and Description of Pain Patient Has Paino No Site Locations With Dressing Change: No Pain Management and Medication Current Pain Management: Electronic Signature(s) Signed: 07/23/2016 3:17:24 PM By: [name] Entered By: [name] on 07/23/2016 14:26:13 Makar, Jadon Loletha Grayer ([id number]) -------------------------------------------------------------------------------- Wound Assessment Details Patient Name: [name], Nevaen C. Date of Service: 07/23/2016 2:30 PM Medical Record Patient Account Number: [id number] Number: Treating RN: [name] 08/07/1934 (81 y.o. Other Clinician: Date of Birth/Sex: Female) Treating [name], [place] Primary Care Roe Wilner: [name] Jasmina Gendron/Extender: G Referring  Latham Kinzler: [name] in Treatment: 34 Wound Status Wound Number: 8 Primary Venous Leg Ulcer Etiology: Wound Location: Right Lower Leg - Medial Wound Status: Open Wounding Event: Trauma Comorbid Cataracts, Arrhythmia, Hypotension, Date Acquired: 04/06/2016 History: Osteoarthritis Weeks Of Treatment: 12 Clustered Wound: No Photos Photo Uploaded By: [name] on 07/23/2016 15:16:50 Wound Measurements Length: (cm) 0.2 Width: (cm) 0.2 Depth: (cm) 0.2 Area: (cm) 0.031 Volume: (cm) 0.006 % Reduction in Area: 85.9% % Reduction in Volume: 72.7% Epithelialization: Large (67-100%) Tunneling: No Undermining: No Wound Description Full Thickness Without Exposed Classification: Support Structures Wound Margin: Distinct, outline attached Exudate Medium Amount: Exudate Type: Serosanguineous Exudate Color: red, brown Foul Odor After Cleansing: No Wound Bed Granulation Amount: None Present (0%) Exposed Structure Necrotic Amount: Large (67-100%) Fascia Exposed: No Necrotic Quality: Adherent Slough Fat Layer (Subcutaneous Tissue) Exposed: Yes Dahan, Juvia C. ([id number]) Tendon Exposed: No Muscle Exposed: No Joint Exposed: No Bone Exposed: No Periwound Skin Texture Texture Color No Abnormalities Noted: No No Abnormalities Noted: No Moisture Temperature / Pain No Abnormalities Noted: No Temperature: No Abnormality Dry / Scaly: Yes Tenderness on Palpation: Yes Wound Preparation Ulcer Cleansing: Rinsed/Irrigated with Saline Topical Anesthetic Applied: Other: lidocaine 4%, Treatment Notes Wound #8 (Right, Medial Lower Leg) 1. Cleansed with: Clean wound with Normal Saline 2. Anesthetic Topical Lidocaine 4% cream to wound bed  prior to debridement 4. Dressing Applied: Promogran 5. Secondary Dressing Applied Dry Gauze Notes [email or web address] Electronic Signature(s) Signed: 07/23/2016 3:17:24 PM By: [name] Entered By: [name] on  07/23/2016 14:43:16 Hiebert, [name] ([id number]) -------------------------------------------------------------------------------- Wound Assessment Details Patient Name: [name] Date of Service: 07/23/2016 2:30 PM Medical Record Patient Account Number: [id number] Number: Treating RN: [name] 1934-09-22 (81 y.o. Other Clinician: Date of Birth/Sex: Female) Treating [name], [place] Primary Care Legacy Carrender: [name] Erie Radu/Extender: G Referring Lennis Korb: [name] in Treatment: 34 Wound Status Wound Number: 9 Primary Venous Leg Ulcer Etiology: Wound Location: Left Lower Leg - Posterior Wound Status: Open Wounding Event: Gradually Appeared Comorbid Cataracts, Arrhythmia, Hypotension, Date Acquired: 06/26/2016 History: Osteoarthritis Weeks Of Treatment: 3 Clustered Wound: No Photos Photo Uploaded By: [name] on 07/23/2016 15:16:50 Wound Measurements Length: (cm) 2 Width: (cm) 1.7 Depth: (cm) 0.2 Area: (cm) 2.67 Volume: (cm) 0.534 % Reduction in Area: 54.7% % Reduction in Volume: 9.3% Epithelialization: None Tunneling: No Undermining: No Wound Description Full Thickness Without Exposed Classification: Support Structures Wound Margin: Flat and Intact Exudate Large Amount: Exudate Type: Serosanguineous Exudate Color: red, brown Foul Odor After Cleansing: No Wound Bed Granulation Amount: Small (1-33%) Exposed Structure Granulation Quality: Red Fascia Exposed: No Necrotic Amount: Large (67-100%) Fat Layer (Subcutaneous Tissue) Exposed: No Charley, Yousra C. ([id number]) Necrotic Quality: Adherent Slough Tendon Exposed: No Muscle Exposed: No Joint Exposed: No Bone Exposed: No Limited to Skin Breakdown Periwound Skin Texture Texture Color No Abnormalities Noted: No No Abnormalities Noted: No Moisture Temperature / Pain No Abnormalities Noted: No Temperature: No Abnormality Tenderness on Palpation:  Yes Wound Preparation Ulcer Cleansing: Rinsed/Irrigated with Saline Topical Anesthetic Applied: Other: lidociane 4%, Treatment Notes Wound #9 (Left, Posterior Lower Leg) 1. Cleansed with: Clean wound with Normal Saline Cleanse wound with antibacterial soap and water 2. Anesthetic Topical Lidocaine 4% cream to wound bed prior to debridement 4. Dressing Applied: Iodoflex 5. Secondary Dressing Applied ABD Pad Dry Gauze Notes [email or web address] Electronic Signature(s) Signed: 07/23/2016 3:17:24 PM By: [name] Entered By: [name] on 07/23/2016 14:42:58 Johannsen, [name] ([id number]) -------------------------------------------------------------------------------- Vitals Details Patient Name: [name] Date of Service: 07/23/2016 2:30 PM Medical Record Patient Account Number: [id number] Number: Treating RN: [name] Jul 14, 1934 (81 y.o. Other Clinician: Date of Birth/Sex: Female) Treating [name], [name] Primary Care Pier Bosher: [name] Grason [place]/Extender: G Referring Alison Kubicki: [name] in Treatment: 34 Vital Signs Time Taken: 14:26 Pulse (bpm): 84 Height (in): 68 Respiratory Rate (breaths/min): 16 Weight (lbs): 178 Blood Pressure (mmHg): 116/76 Body Mass Index (BMI): 27.1 Reference Range: 80 - 120 mg / dl Electronic Signature(s) Signed: 07/23/2016 3:17:24 PM By: [name] Entered By: [name] on 07/23/2016 14:27:48

## 2016-07-25 LAB — INFLUENZA PANEL BY PCR (TYPE A & B)
INFLAPCR: NEGATIVE
Influenza B By PCR: NEGATIVE

## 2016-07-30 ENCOUNTER — Encounter: Payer: Medicare Other | Admitting: Internal Medicine

## 2016-07-30 DIAGNOSIS — I87333 Chronic venous hypertension (idiopathic) with ulcer and inflammation of bilateral lower extremity: Secondary | ICD-10-CM | POA: Diagnosis not present

## 2016-07-31 NOTE — Progress Notes (Addendum)
[name] ([id number]) Visit Report for 07/30/2016 Chief Complaint Document Details Patient Name: [name], [name]. Date of Service: 07/30/2016 2:30 PM Medical Record Patient Account Number: [id number] Number: Treating RN: [name] [birth year] (81 y.o. Other Clinician: Date of Birth/Sex: Female) Treating [name], [name] Primary Care Provider: [name] Provider/Extender: G Referring Provider: [name] in Treatment: 35 Information Obtained from: Patient Chief Complaint Patient here for reevaluation of her right lower externally ulcer Electronic Signature(s) Signed: 07/30/2016 5:54:02 PM By: [name] MD Entered By: [name] on 07/30/2016 15:08:31 Mix, [name] ([id number]) -------------------------------------------------------------------------------- HPI Details Patient Name: [name]. Date of Service: 07/30/2016 2:30 PM Medical Record Patient Account Number: [id number] Number: Treating RN: [name] December 15, 1934 (81 y.o. Other Clinician: Date of Birth/Sex: Female) Treating [name], [name] Primary Care Provider: [name] Provider/Extender: G Referring Provider: [name] in Treatment: 35 History of Present Illness HPI Description: 11/22/15; this is [name] is a 82 year old woman who lives at home on her own. According to the patient and her daughter was present she has had long-standing edema in her legs dating back many years. She also has a history of chronic systolic heart failure, atrial fibrillation and is status post mitral valve replacement. Last echocardiogram I see in [facility] showed an ejection fraction of 40-45% she is on Lasix 60 mg a day and spironolactone 25 mg a day. Her current problem began in December around Christmas time she developed a small hematoma in the medial part of her left leg which rapidly expanded to a very large hematoma that required  surgical debridement. This situation was complicated by the fact that the patient is on long-standing Coumadin for mechanical heart valve. She went to the OR had this evacuated on January 4 /17. The wound has gradually improved however she has developed a small wounds around this area and more recently a wound on the right lateral leg. She is weeping edema fluid. The patient is already been to see vascular surgery. It was recommended that she wear Unna boots, she did not tolerate this due to pain in the left leg. She was then prescribed Juzo stockings and really can't get these on herself although truthfully there is probably too much edema for a Juzo stockings currently. She is not a diabetic and has no history of PAD or claudication that I could elicit. She does not use the external compression pumps reliably. She comes today with notes from her primary physician and Dr. Ronalee Belts both recommending various forms of compression but the patient does not really complied with them. Has been using the external compression pumps with some regularity but certainly not daily on the right leg and this has helped. I also note that her daughter tells me the history that she does not sleep in bed at home. She has a hospital bed but with her legs up she finds this painful so she sleeps in the couch sitting up with her legs dependent. 11/29/15; the patient is arrives today accompanied by her son. He expresses satisfaction that she is maintained the compression all week. 12/06/15; the patient has kept her Profore light wraps on, we have good edema control no major change in the wounds we have been using Aquacel 12/13/15; changed to RTD last week. One of the 3 wounds on her left medial leg is healed she has 2 remaining wounds here and one on the right lateral leg. 12/18/2015 -- the patient was at Dr. Nino Parsley office today and he  was seeing her for an arterial study. While the wrap was being removed she had an  inadvertent laceration of the left proximal anterior leg which bled quite profusely and a compression dressing was applied over this and the patient was here to get her Profore wraps done. I was asked to see the patient to make an assessment and treat appropriately. 12/26/15; the patient's injury on the left proximal leg and Steri-Strips removed after soaking. There is an open area here. The original wounds to still open on the right lateral and left medial leg. 01/03/16 patient's injury on her proximal left leg looks quite good. Still small open area on the medial left leg which appears to be improving. The area on the right lateral leg still substantially open with no real improvement in wound depth. Her edema control is marginal with a Profore light. We have been using RTD for 3-4 weeks without any major change 01/10/16: wounds without s/s of infection. vascular results are pending regarding arterial studies. [name], [name] ([id number]) 01/17/16; patient comes in today complaining of severe pain however I think most of this is in the right hip not related to her wounds. She continues with a oval-shaped wound on the right lateral leg, trauma to the left anterior leg just below her tibial plateau. She has a smaller eschar on the left anterior leg. 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 The area which was a scissor injury on her left upper anterior lower leg is fully epithelialized. She only has a small open area remaining on the medial aspect of her left leg. The oval-shaped wound on the right mid lateral leg may be a bit smaller. Debrided of surface slough nonviable subcutaneous tissue. I had changed to collagen 2 weeks ago in an attempt to get this to close 01/31/16 all  the patient's wounds on her left leg give healed. We have good edema control with bilateral Profore lites which she has been compliant with. She still has the oval-shaped wound on the right lateral calf allergic even this appears to be slowly improving. The patient has juxtalite stockings at home. She states she thinks she can put these on. She also has external compression pumps at home although I think her compliance with this has not been good in the past. She complains today of edema in her thighs. Tells me she takes 40 mg of Lasix daily 02/21/16; only 1 small wound remains on the lateral aspect of the right calf. She is using Juzo stockings on the left leg although she complains about difficulty in applying them. She has external compression pumps at home 02/28/16; the small open wound on her right lateral calf is improved in terms of wound area. It appears that she has a wrap injury on the anterior aspect of the upper leg 03/06/16; the small open wound on her right lateral calf has a very small open area remaining. The wrap injury on the anterior aspect of the upper leg also appears better. She arrives today in clinic with a history of dyspnea with minimal exertion starting with the last 2-4 days. She does not describe chest pain. Her son and our intake nurse think she has facial swelling. She has a history of an artificial mitral valve on Coumadin  03/13/16; the small wound on the right lateral calf is no better this week. The superior wrap injury anteriorly requires debridement of surface eschar and nonviable subcutaneous tissue. She has been to her primary doctor with regards to her dyspnea we identified last week. Per the patient's description her Lasix has been increased 03/21/2016 -- patient of Dr. Dellia Nims who could not keep her appointment yesterday but has come in today with the right lower leg looking good and this is the leg which has been treated in the recent past. However, her left  lower extremity is extremely swollen, tender and edematous with redness and discoloration. 03/27/16; there is only 2 small areas remain on the right leg. The edema that was so concerning last week has come down however there is extensive bruising on the lateral left leg medial left foot suggesting that she lost some blood into the leg itself. I checked her hemoglobin on 9/21 was 14.5. Her INR was over 6. Duplex ultrasound was negative for DVT 04/03/16 at this point in time today patient is actually doing substantially better in regard to the wound on the anterior right lower extremity. currently there is no slough or eschar noted and no evidence of erythema, discharge, or local infection. She is exhibiting no signs of systemic infection. She is tolerating the dressing changes as well as the wrapping at this point in time. 04/24/16; I have not seen Mrs. Milleson for 3 weeks. Apparently she was admitted to hospital for respiratory issues/also apparently has had multiple compression fractures and had kyphoplasty. When she was last here she only had one small open area remaining on the left leg she was using juxtalite stockings. Her son is upset about restarting the Coumadin which she blames or the swelling in her legs. She is on Coumadin for prophylaxis with a chronic artificial heart valve. 04/30/16 at this point in time patient has been tolerating the 3 layer compression wrap as well as the calcium alginate. 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 With tthat being said she does have the [place], [place]. ([id number]) continued wound of the left lower extremity as well as the right medial lower extremity. The right side is significantly smaller compared to the left but actually is slough covered. fortunately she has no significant tenderness at rest although with manipulation of the right location of the wound this is significantly tender. 05/07/16 for follow-up evaluation today both the  patient's wounds appear to be significantly improved in size compared to last week. She is also having less pain at both locations currently. Her pain level at most is related to be a 1-2 out of 10. She does have some discharge but fortunately no evidence of infection at this point.she also continues to tolerate the compression wraps well. 05/14/16; the patient's wound on the left leg actually looks as though it's on its way to closure. She is using collagen to this area. She has a small punched out area over the right medial calf. The cause of this is not really clear it has probably 0.4 cm of depth. She has an IV in her right hand which she states is for IV fluid when she develops low sodiums her potassiums, the etiology of this is not clear 05-21-16 she presents today with continued ulcerations the right lower extremity, posterior aspect. she has been using Iodoflex and compression therapy to the area. She denies any new injuries or trauma. She does have 2 areas proximal to this ulceration of dry crust  along with an area to her left posterior lower leg that is purple discolored area appears similar to how her current ulceration started, she denies any trauma or injury to the left posterior leg we will monitor all of these areas. 05/28/16; the patient is down to 1 small open area on the right medial mid calf. This is however larger and in 50% of the surface area deeper approximately 0.4 cm. The reason for this deterioration is unclear. I've gone ahead and done a culture of this area she also tells [place] today that she is short of breath. She is also noted a swelling on her lower left eyelid 06/05/16; the patient is 1 small open area on the right medial mid calf. This looks about the same as last week. The deep area, medial 50% of the wound looks about the same. CandS I did last week was negative. The entire area looks about the same 06/12/16; not much change in this over the course of the last  week. Patient comes in today complaining of extreme back pain. Apparently she has either a kyphoplasty or vertebral plasty scheduled at [facility] radiology tomorrow. For this reason no debridement today. We have been using Iodoflex 06/19/16; patient arrived with the surface eschar from last week removed with a curet debrided of subcutaneous tissue. She tolerates this reasonably well. She is still in a skilled facility. States her vertebral plasty last week is made her pain bearable 06/26/16; patient arrived with a new wound on the posterior aspect of her left calf. She notices 3 or 4 days ago but is not certain how this happened. She states she simply felt a stinging sensation and one day on her foot and then the next day on the back of her calf. 06/28/2016 -- Ms. Reubin Milan called urgently saying that the pain in her left foot was drastically worse over the last several hours and though she removed her compression wrap she wanted to be seen before the long holiday. 07/03/16; she came in to see Dr. Con Memos on 12/29 for pain in her left foot/heel. Nothing much was found at the time and she states that the pain is better but involved her whole foot. She has a wound on the lateral aspect of the left calf and the medial aspect of the right. The medial 1 looks as if it is just about healed however the left one still has considerable edema. 07/09/16; she arrives in clinic today with a dime-sized wound on the left posterior calf and a small linear area on the right medial calf. The area on the left was debrided with a #3 curet nonviable subcutaneous tissue, we are not making progress here. The area on the right was also debrided of surface eschar and nonviable subcutaneous tissue this has some depth to it. 07/16/16; she has a dime size wound on the left posterior calf that has some depth. This required debridement last week, although I did not feel the need to do this today. We used Iodoflex last week.  Small area on the right also was not debrided. This looks like it may be close to closing 07/23/16; using Iodoflex to both wound areas. The area on the right posterior/medial calf is just about closed. She still has a fairly substantial wound on the left posterior leg which is still problematic. 07/30/16; the area on the posterior right leg is closed. The patient will graduate to her own pressure stocking. She has a punched-out area on the left mid calf.  The surface is better than last week. I did not attempt Blevins, Deania C. ([id number]) debridement. Continuing Iodoflex Is complaining about pain in the right lateral ribs this is not pleuritic. She apparently had a chest x-ray done at home the immobile X x-ray. I don't have access to that report. She is not complaining of inspiratory chest pain cough etc. Electronic Signature(s) Signed: 07/30/2016 5:54:02 PM By: [name] MD Entered By: [name] on 07/30/2016 15:15:10 Levario, [name] ([id number]) -------------------------------------------------------------------------------- Physical Exam Details Patient Name: [name], [name] Date of Service: 07/30/2016 2:30 PM Medical Record Patient Account Number: [id number] Number: Treating RN: [name] 11-08-1934 (81 y.o. Other Clinician: Date of Birth/Sex: Female) Treating [name], [name] Primary Care Provider: [name] Provider/Extender: G Referring Provider: [name] in Treatment: 35 Constitutional Sitting or standing Blood Pressure is within target range for patient.. Pulse regular and within target range for patient.Marland Kitchen Respirations regular, non-labored and within target range.. Temperature is normal and within the target range for the patient.. Patient's appearance is neat and clean. Appears in no acute distress. Well nourished and well developed.Marland Kitchen Respiratory Respiratory effort is easy and symmetric bilaterally. Rate is normal at rest  and on room air.. Bilateral breath sounds are clear and equal in all lobes with no wheezes, rales or rhonchi.. Cardiovascular Pedal pulses palpable and strong bilaterally.. Lymphatic . Integumentary (Hair, Skin) There is no rash in her back or lateral right ribs. Notes Wound exam; the area on the left posterior leg is her only remaining area. The surface of this looks better than last week. I think if we continue the Iodoflex for another week we should be ready for an alternative dressing perhaps Hydrofera Blue Electronic Signature(s) Signed: 07/30/2016 5:54:02 PM By: [name] MD Entered By: [name] on 07/30/2016 15:15:34 Barrack, [name] ([id number]) -------------------------------------------------------------------------------- Physician Orders Details Patient Name: Carrie Mulligan C. Date of Service: 07/30/2016 2:30 PM Medical Record Patient Account Number: [id number] Number: Treating RN: [name] [birth year] (81 y.o. Other Clinician: Date of Birth/Sex: Female) Treating [name], [name] Primary Care Provider: [name] Provider/Extender: G Referring Provider: [name] in Treatment: 12 Verbal / Phone Orders: Yes Clinician: Carolyne Fiscal, Debi Read Back and Verified: Yes Diagnosis Coding Wound Cleansing Wound #9 Left,Posterior Lower Leg o Clean wound with Normal Saline. o Cleanse wound with mild soap and water o May Shower, gently pat wound dry prior to applying new dressing. Anesthetic Wound #9 Left,Posterior Lower Leg o Topical Lidocaine 4% cream applied to wound bed prior to debridement - for clinic use Primary Wound Dressing Wound #9 Left,Posterior Lower Leg o Iodoflex Secondary Dressing Wound #9 Left,Posterior Lower Leg o ABD pad o Dry Gauze Dressing Change Frequency Wound #9 Left,Posterior Lower Leg o Other: - HHRN change it Fridays and pt is has it changed at wound care clinic on  Wednesdays. Follow-up Appointments Wound #9 Left,Posterior Lower Leg o Return Appointment in 1 week. Edema Control Wound #9 Left,Posterior Lower Leg o Kerlix and Coban - Bilateral Additional Orders / Instructions Wound #9 Left,Posterior Lower Leg Shakoor, Aimie C. ([id number]) o Increase protein intake. Home Health Wound #9 [employer] Nurse may visit PRN to address patientos wound care needs. o FACE TO FACE ENCOUNTER: MEDICARE and MEDICAID PATIENTS: I certify that this patient is under my care and that I had a face-to-face encounter that meets the physician face-to-face encounter requirements with this patient on this date. The encounter with the patient was  in whole or in part for the following MEDICAL CONDITION: (primary reason for Home Healthcare) MEDICAL NECESSITY: I certify, that based on my findings, NURSING services are a medically necessary home health service. HOME BOUND STATUS: I certify that my clinical findings support that this patient is homebound (i.e., Due to illness or injury, pt requires aid of supportive devices such as crutches, cane, wheelchairs, walkers, the use of special transportation or the assistance of another person to leave their place of residence. There is a normal inability to leave the home and doing so requires considerable and taxing effort. Other absences are for medical reasons / religious services and are infrequent or of short duration when for other reasons). o If current dressing causes regression in wound condition, may D/C ordered dressing product/s and apply Normal Saline Moist Dressing daily until next [place] / Other MD appointment. [employer] of regression in wound condition at [phone]. o Please direct any NON-WOUND related issues/requests for orders to patient's Primary Care Physician Electronic Signature(s) Signed: 07/30/2016  4:30:41 PM By: [name] Signed: 07/30/2016 5:54:02 PM By: [name] MD Entered By: [name] on 07/30/2016 15:32:31 Sturgill, [name] ([id number]) -------------------------------------------------------------------------------- Problem List Details Patient Name: [name], [name] Date of Service: 07/30/2016 2:30 PM Medical Record Patient Account Number: [id number] Number: Treating RN: [name] October 24, 1934 (81 y.o. Other Clinician: Date of Birth/Sex: Female) Treating [name], [name] Primary Care Provider: [name] Provider/Extender: G Referring Provider: [name] in Treatment: 35 Active Problems ICD-10 Encounter Code Description Active Date Diagnosis L97.812 Non-pressure chronic ulcer of other part of right lower leg 04/30/2016 Yes with fat layer exposed I87.333 Chronic venous hypertension (idiopathic) with ulcer and 04/03/2016 Yes inflammation of bilateral lower extremity I89.0 Lymphedema, not elsewhere classified 04/03/2016 Yes X41.28 Chronic systolic (congestive) heart failure 04/03/2016 Yes L97.221 Non-pressure chronic ulcer of left calf limited to 07/03/2016 Yes breakdown of skin Inactive Problems Resolved Problems ICD-10 Code Description Active Date Resolved Date S81.812S Laceration without foreign body, left lower leg, sequela 04/03/2016 04/03/2016 Electronic Signature(s) Signed: 07/30/2016 5:54:02 PM By: [name] MD Entered By: [name] on 07/30/2016 15:06:52 Willner, [name] ([id number]) 74, [place] ([id number]) -------------------------------------------------------------------------------- Progress Note Details Patient Name: Carrie Mulligan C. Date of Service: 07/30/2016 2:30 PM Medical Record Patient Account Number: [id number] Number: Treating RN: [name] [birth year] (81 y.o. Other Clinician: Date of Birth/Sex: Female) Treating [name], [name] Primary Care Provider:  [name] Provider/Extender: G Referring Provider: [name] in Treatment: 98 Subjective Chief Complaint Information obtained from Patient Patient here for reevaluation of her right lower externally ulcer History of Present Illness (HPI) 11/22/15; this is Dowers is a 82 year old woman who lives at home on her own. According to the patient and her daughter was present she has had long-standing edema in her legs dating back many years. She also has a history of chronic systolic heart failure, atrial fibrillation and is status post mitral valve replacement. Last echocardiogram I see in [facility] showed an ejection fraction of 40-45% she is on Lasix 60 mg a day and spironolactone 25 mg a day. Her current problem began in December around Christmas time she developed a small hematoma in the medial part of her left leg which rapidly expanded to a very large hematoma that required surgical debridement. This situation was complicated by the fact that the patient is on long-standing Coumadin for mechanical heart valve. She went to the OR had this evacuated on January 4 /17. The wound  has gradually improved however she has developed a small wounds around this area and more recently a wound on the right lateral leg. She is weeping edema fluid. The patient is already been to see vascular surgery. It was recommended that she wear Unna boots, she did not tolerate this due to pain in the left leg. She was then prescribed Juzo stockings and really can't get these on herself although truthfully there is probably too much edema for a Juzo stockings currently. She is not a diabetic and has no history of PAD or claudication that I could elicit. She does not use the external compression pumps reliably. She comes today with notes from her primary physician and Dr. Ronalee Belts both recommending various forms of compression but the patient does not really complied with them. Has been using the  external compression pumps with some regularity but certainly not daily on the right leg and this has helped. I also note that her daughter tells me the history that she does not sleep in bed at home. She has a hospital bed but with her legs up she finds this painful so she sleeps in the couch sitting up with her legs dependent. 11/29/15; the patient is arrives today accompanied by her son. He expresses satisfaction that she is maintained the compression all week. 12/06/15; the patient has kept her Profore light wraps on, we have good edema control no major change in the wounds we have been using Aquacel 12/13/15; changed to RTD last week. One of the 3 wounds on her left medial leg is healed she has 2 remaining wounds here and one on the right lateral leg. 12/18/2015 -- the patient was at Dr. Nino Parsley office today and he was seeing her for an arterial study. While the wrap was being removed she had an inadvertent laceration of the left proximal anterior leg which bled quite profusely and a compression dressing was applied over this and the patient was here to get her Profore wraps done. I was asked to see the patient to make an assessment and treat appropriately. 12/26/15; the patient's injury on the left proximal leg and Steri-Strips removed after soaking. There is an open area here. The original wounds to still open on the right lateral and left medial leg. [name], [name] ([id number]) 01/03/16 patient's injury on her proximal left leg looks quite good. Still small open area on the medial left leg which appears to be improving. The area on the right lateral leg still substantially open with no real improvement in wound depth. Her edema control is marginal with a Profore light. We have been using RTD for 3-4 weeks without any major change 01/10/16: wounds without s/s of infection. vascular results are pending regarding arterial studies. 01/17/16; patient comes in today complaining of severe pain  however I think most of this is in the right hip not related to her wounds. She continues with a oval-shaped wound on the right lateral leg, trauma to the left anterior leg just below her tibial plateau. She has a smaller eschar on the left anterior leg. 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 The area which was a scissor injury on her left upper anterior lower leg is fully epithelialized. She only has a small open area remaining on  the medial aspect of her left leg. The oval-shaped wound on the right mid lateral leg may be a bit smaller. Debrided of surface slough nonviable subcutaneous tissue. I had changed to collagen 2 weeks ago in an attempt to get this to close 01/31/16 all the patient's wounds on her left leg give healed. We have good edema control with bilateral Profore lites which she has been compliant with. She still has the oval-shaped wound on the right lateral calf allergic even this appears to be slowly improving. The patient has juxtalite stockings at home. She states she thinks she can put these on. She also has external compression pumps at home although I think her compliance with this has not been good in the past. She complains today of edema in her thighs. Tells me she takes 40 mg of Lasix daily 02/21/16; only 1 small wound remains on the lateral aspect of the right calf. She is using Juzo stockings on the left leg although she complains about difficulty in applying them. She has external compression pumps at home 02/28/16; the small open wound on her right lateral calf is improved in terms of wound area. It appears that she has a wrap injury on the anterior aspect of the upper leg 03/06/16; the small open wound on her right lateral calf has a very small  open area remaining. The wrap injury on the anterior aspect of the upper leg also appears better. She arrives today in clinic with a history of dyspnea with minimal exertion starting with the last 2-4 days. She does not describe chest pain. Her son and our intake nurse think she has facial swelling. She has a history of an artificial mitral valve on Coumadin 03/13/16; the small wound on the right lateral calf is no better this week. The superior wrap injury anteriorly requires debridement of surface eschar and nonviable subcutaneous tissue. She has been to her primary doctor with regards to her dyspnea we identified last week. Per the patient's description her Lasix has been increased 03/21/2016 -- patient of Dr. Dellia Nims who could not keep her appointment yesterday but has come in today with the right lower leg looking good and this is the leg which has been treated in the recent past. However, her left lower extremity is extremely swollen, tender and edematous with redness and discoloration. 03/27/16; there is only 2 small areas remain on the right leg. The edema that was so concerning last week has come down however there is extensive bruising on the lateral left leg medial left foot suggesting that she lost some blood into the leg itself. I checked her hemoglobin on 9/21 was 14.5. Her INR was over 6. Duplex ultrasound was negative for DVT 04/03/16 at this point in time today patient is actually doing substantially better in regard to the wound on the anterior right lower extremity. currently there is no slough or eschar noted and no evidence of erythema, discharge, or local infection. She is exhibiting no signs of systemic infection. She is tolerating the dressing changes as well as the wrapping at this point in time. 04/24/16; I have not seen Mrs. Monceaux for 3 weeks. Apparently she was admitted to hospital for respiratory issues/also apparently has had multiple compression fractures and had  kyphoplasty. When she was last here she only had one small open area remaining on the left leg she was using juxtalite stockings. Her son [name], [name] ([id number]) is upset about restarting the Coumadin which she blames or the swelling in  her legs. She is on Coumadin for prophylaxis with a chronic artificial heart valve. 04/30/16 at this point in time patient has been tolerating the 3 layer compression wrap as well as the calcium alginate. 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 With tthat being said she does have the continued wound of the left lower extremity as well as the right medial lower extremity. The right side is significantly smaller compared to the left but actually is slough covered. fortunately she has no significant tenderness at rest although with manipulation of the right location of the wound this is significantly tender. 05/07/16 for follow-up evaluation today both the patient's wounds appear to be significantly improved in size compared to last week. She is also having less pain at both locations currently. Her pain level at most is related to be a 1-2 out of 10. She does have some discharge but fortunately no evidence of infection at this point.she also continues to tolerate the compression wraps well. 05/14/16; the patient's wound on the left leg actually looks as though it's on its way to closure. She is using collagen to this area. She has a small punched out area over the right medial calf. The cause of this is not really clear it has probably 0.4 cm of depth. She has an IV in her right hand which she states is for IV fluid when she develops low sodiums her potassiums, the etiology of this is not clear 05-21-16 she presents today with continued ulcerations the right lower extremity, posterior aspect. she has been using Iodoflex and compression therapy to the area. She denies any new injuries or trauma. She does have 2 areas proximal to this ulceration of dry  crust along with an area to her left posterior lower leg that is purple discolored area appears similar to how her current ulceration started, she denies any trauma or injury to the left posterior leg we will monitor all of these areas. 05/28/16; the patient is down to 1 small open area on the right medial mid calf. This is however larger and in 50% of the surface area deeper approximately 0.4 cm. The reason for this deterioration is unclear. I've gone ahead and done a culture of this area she also tells [place] today that she is short of breath. She is also noted a swelling on her lower left eyelid 06/05/16; the patient is 1 small open area on the right medial mid calf. This looks about the same as last week. The deep area, medial 50% of the wound looks about the same. CandS I did last week was negative. The entire area looks about the same 06/12/16; not much change in this over the course of the last week. Patient comes in today complaining of extreme back pain. Apparently she has either a kyphoplasty or vertebral plasty scheduled at [facility] radiology tomorrow. For this reason no debridement today. We have been using Iodoflex 06/19/16; patient arrived with the surface eschar from last week removed with a curet debrided of subcutaneous tissue. She tolerates this reasonably well. She is still in a skilled facility. States her vertebral plasty last week is made her pain bearable 06/26/16; patient arrived with a new wound on the posterior aspect of her left calf. She notices 3 or 4 days ago but is not certain how this happened. She states she simply felt a stinging sensation and one day on her foot and then the next day on the back of her calf. 06/28/2016 -- Ms. Reubin Milan  called urgently saying that the pain in her left foot was drastically worse over the last several hours and though she removed her compression wrap she wanted to be seen before the long holiday. 07/03/16; she came in to see Dr. Con Memos  on 12/29 for pain in her left foot/heel. Nothing much was found at the time and she states that the pain is better but involved her whole foot. She has a wound on the lateral aspect of the left calf and the medial aspect of the right. The medial 1 looks as if it is just about healed however the left one still has considerable edema. 07/09/16; she arrives in clinic today with a dime-sized wound on the left posterior calf and a small linear area on the right medial calf. The area on the left was debrided with a #3 curet nonviable subcutaneous tissue, we are not making progress here. The area on the right was also debrided of surface eschar and nonviable subcutaneous tissue this has some depth to it. 07/16/16; she has a dime size wound on the left posterior calf that has some depth. This required debridement last week, although I did not feel the need to do this today. We used Iodoflex last week. Small [name], [name] ([id number]) area on the right also was not debrided. This looks like it may be close to closing 07/23/16; using Iodoflex to both wound areas. The area on the right posterior/medial calf is just about closed. She still has a fairly substantial wound on the left posterior leg which is still problematic. 07/30/16; the area on the posterior right leg is closed. The patient will graduate to her own pressure stocking. She has a punched-out area on the left mid calf. The surface is better than last week. I did not attempt debridement. Continuing Iodoflex Is complaining about pain in the right lateral ribs this is not pleuritic. She apparently had a chest x-ray done at home the immobile X x-ray. I don't have access to that report. She is not complaining of inspiratory chest pain cough etc. Objective Constitutional Sitting or standing Blood Pressure is within target range for patient.. Pulse regular and within target range for patient.Marland Kitchen Respirations regular, non-labored and within target  range.. Temperature is normal and within the target range for the patient.. Patient's appearance is neat and clean. Appears in no acute distress. Well nourished and well developed.. Vitals Time Taken: 2:27 PM, Height: 68 in, Weight: 178 lbs, BMI: 27.1, Temperature: 97.6 F, Pulse: 81 bpm, Respiratory Rate: 16 breaths/min, Blood Pressure: 130/58 mmHg. Respiratory Respiratory effort is easy and symmetric bilaterally. Rate is normal at rest and on room air.. Bilateral breath sounds are clear and equal in all lobes with no wheezes, rales or rhonchi.. Cardiovascular Pedal pulses palpable and strong bilaterally.. General Notes: Wound exam; the area on the left posterior leg is her only remaining area. The surface of this looks better than last week. I think if we continue the Iodoflex for another week we should be ready for an alternative dressing perhaps Hydrofera Blue Integumentary (Hair, Skin) There is no rash in her back or lateral right ribs. Wound #8 status is Healed - Epithelialized. Original cause of wound was Trauma. The wound is located on the Right,Medial Lower Leg. The wound measures 0cm length x 0cm width x 0cm depth; 0cm^2 area and 0cm^3 volume. There is no tunneling or undermining noted. There is a none present amount of drainage noted. There is no granulation within the wound bed. There  is no necrotic tissue within the wound bed. Wound #9 status is Open. Original cause of wound was Gradually Appeared. The wound is located on the Left,Posterior Lower Leg. The wound measures 2cm length x 2cm width x 0.2cm depth; 3.142cm^2 area [name], [name] ([id number]) and 0.628cm^3 volume. The wound is limited to skin breakdown. There is no tunneling or undermining noted. There is a large amount of serosanguineous drainage noted. The wound margin is flat and intact. There is small (1-33%) red granulation within the wound bed. There is a large (67-100%) amount of necrotic tissue within the  wound bed including Adherent Slough. Periwound temperature was noted as No Abnormality. The periwound has tenderness on palpation. 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 Cleanse wound with mild soap and water May Shower, gently pat wound dry prior to applying new dressing. Anesthetic: Wound #9 Left,Posterior Lower Leg: Topical Lidocaine 4% cream applied to wound bed prior to debridement - for clinic use Primary Wound Dressing: Wound #9 Left,Posterior Lower Leg: Iodoflex Secondary Dressing: Wound #9 Left,Posterior Lower Leg: ABD pad Dry Gauze Dressing Change Frequency: Wound #9 Left,Posterior Lower Leg: Other: - HHRN change it Fridays and pt is has it changed at wound care clinic on Wednesdays. Follow-up Appointments: Wound #9 Left,Posterior Lower Leg: Return Appointment in 1 week. Edema Control: Wound #9 Left,Posterior Lower Leg: [name], [name] ([id number]) Kerlix and Coban - Bilateral Additional Orders / Instructions: Wound #9 Left,Posterior Lower Leg: Increase protein intake. Home Health: Wound #9 Left,Posterior Lower Leg: [place] Nurse may visit PRN to address patient s wound care needs. FACE TO FACE ENCOUNTER: MEDICARE and MEDICAID PATIENTS: I certify that this patient is under my care and that I had a face-to-face encounter that meets the physician face-to-face encounter requirements with this patient on this date. The encounter with the patient was in whole or in part for  the following MEDICAL CONDITION: (primary reason for [place]) MEDICAL NECESSITY: I certify, that based on my findings, NURSING services are a medically necessary home health service. HOME BOUND STATUS: I certify that my clinical findings support that this patient is homebound (i.e., Due to illness or injury, pt requires aid of supportive devices such as crutches, cane, wheelchairs, walkers, the use of special transportation or the assistance of another person to leave their place of residence. There is a normal inability to leave the home and doing so requires considerable and taxing effort. Other absences are for medical reasons / religious services and are infrequent or of short duration when for other reasons). If current dressing causes regression in wound condition, may D/C ordered dressing product/s and apply Normal Saline Moist Dressing daily until next [place] / Other MD appointment. [place] of regression in wound condition at [phone]. Please direct any NON-WOUND related issues/requests for orders to patient's Primary Care Physician #1 still iodoflex to the left calf,o hyrofera blue next week #2 await CxR oradicular pain. No rash lung sound clear icluding RML Electronic Signature(s) Signed: 08/01/2016 1:10:30 PM By: Gretta Cool RN, BSN, Kim RN, BSN Signed: 08/14/2016 7:48:39 AM By: Dellia Nims,  Legrand Como MD Previous Signature: 07/30/2016 5:54:02 PM Version By: [name] MD Entered By: Gretta Cool, RN, BSN, Kim on 08/01/2016 13:10:30 [name], [name] ([id number]) -------------------------------------------------------------------------------- SuperBill Details Patient Name: [name] Date of Service: 07/30/2016 Medical Record Patient Account Number: [id number] Number: Treating RN: [name] Jun 24, 1935 (81 y.o. Other Clinician: Date of Birth/Sex: Female) Treating [name], [name] Primary Care Provider: [name] Provider/Extender: G Referring Provider: [name] Service Line: Outpatient Weeks in Treatment: 35 Diagnosis Coding ICD-10 Codes Code Description [phone] Non-pressure chronic ulcer of other part of right lower leg with fat layer exposed Chronic venous hypertension (idiopathic) with ulcer and inflammation of bilateral lower I87.333 extremity I89.0 Lymphedema, not elsewhere classified F02.63 Chronic systolic (congestive) heart failure L97.221 Non-pressure chronic ulcer of left calf limited to breakdown of skin Facility Procedures CPT4 Code: [id number] Description: 99214 - WOUND CARE VISIT-LEV 4 EST PT Modifier: Quantity: 1 Physician Procedures CPT4 Code Description: [id number] - WC PHYS LEVEL 3 - EST PT ICD-10 Description Diagnosis L97.221 Non-pressure chronic ulcer of left calf limited to Modifier: breakdown of ski Quantity: 1 n Electronic Signature(s) Signed: 07/30/2016 4:30:41 PM By: [name] Signed: 07/30/2016 5:54:02 PM By: [name] MD Entered By: [name] on 07/30/2016 15:31:32

## 2016-07-31 NOTE — Progress Notes (Signed)
[name] ([id number]) Visit Report for 07/30/2016 Arrival Information Details Patient Name: [name], [name]. Date of Service: 07/30/2016 2:30 PM Medical Record Patient Account Number: [id number] Number: Treating RN: [name] 1935-01-02 (81 y.o. Other Clinician: Date of Birth/Sex: Female) Treating [name], [name] Primary Care Meeah Totino: [name] Jazzlynn Rawe/Extender: G Referring Enoch Moffa: [name] in Treatment: 29 Visit Information History Since Last Visit All ordered tests and consults were completed: No Patient Arrived: Gilford Rile Added or deleted any medications: No Arrival Time: 14:26 Any new allergies or adverse reactions: No Accompanied By: caregiver Had a fall or experienced change in No Transfer Assistance: EasyPivot Patient activities of daily living that may affect Lift risk of falls: Patient Identification Verified: Yes Signs or symptoms of abuse/neglect since last No Secondary Verification Process Yes visito Completed: Hospitalized since last visit: No Patient Requires Transmission- No Has Dressing in Place as Prescribed: Yes Based Precautions: Has Compression in Place as Prescribed: Yes Patient Has Alerts: Yes Pain Present Now: No Patient Alerts: Patient on Blood Thinner [place]!!! Electronic Signature(s) Signed: 07/30/2016 4:30:41 PM By: [name] Entered By: [name] on 07/30/2016 14:27:41 Lafevers, Shakenya Loletha Grayer ([id number]) -------------------------------------------------------------------------------- Clinic Level of Care Assessment Details Patient Name: Carrie Mulligan C. Date of Service: 07/30/2016 2:30 PM Medical Record Patient Account Number: [id number] Number: Treating RN: [name] Jun 27, 1935 (81 y.o. Other Clinician: Date of Birth/Sex: Female) Treating [name], [place] Primary Care Curvin Hunger: [name] Pleasant Britz/Extender: G Referring  Pachia Strum: [name] in Treatment: 35 Clinic Level of Care Assessment Items TOOL 4 Quantity Score X - Use when only an EandM is performed on FOLLOW-UP visit 1 0 ASSESSMENTS - Nursing Assessment / Reassessment X - Reassessment of Co-morbidities (includes updates in patient status) 1 10 X - Reassessment of Adherence to Treatment Plan 1 5 ASSESSMENTS - Wound and Skin Assessment / Reassessment []  - Simple Wound Assessment / Reassessment - one wound 0 X - Complex Wound Assessment / Reassessment - multiple wounds 2 5 []  - Dermatologic / Skin Assessment (not related to wound area) 0 ASSESSMENTS - Focused Assessment X - Circumferential Edema Measurements - multi extremities 2 5 []  - Nutritional Assessment / Counseling / Intervention 0 []  - Lower Extremity Assessment (monofilament, tuning fork, pulses) 0 []  - Peripheral Arterial Disease Assessment (using hand held doppler) 0 ASSESSMENTS - Ostomy and/or Continence Assessment and Care []  - Incontinence Assessment and Management 0 []  - Ostomy Care Assessment and Management (repouching, etc.) 0 PROCESS - Coordination of Care []  - Simple Patient / Family Education for ongoing care 0 X - Complex (extensive) Patient / Family Education for ongoing care 1 20 X - Staff obtains Programmer, systems, Records, Test Results / Process Orders 1 10 []  - Staff telephones HHA, Nursing Homes / Clarify orders / etc 0 Schiller, Rexanne C. ([id number]) []  - Routine Transfer to another Facility (non-emergent condition) 0 []  - Routine Hospital Admission (non-emergent condition) 0 []  - New Admissions / Biomedical engineer / Ordering NPWT, Apligraf, etc. 0 []  - Emergency Hospital Admission (emergent condition) 0 X - Simple Discharge Coordination 1 10 []  - Complex (extensive) Discharge Coordination 0 PROCESS - Special Needs []  - Pediatric / Minor Patient Management 0 []  - Isolation Patient Management 0 []  - Hearing / Language / Visual special needs 0 []  - Assessment  of Community assistance (transportation, D/C planning, etc.) 0 []  - Additional assistance / Altered mentation 0 []  - Support Surface(s) Assessment (bed, cushion, seat, etc.) 0 INTERVENTIONS - Wound Cleansing / Measurement X -  Simple Wound Cleansing - one wound 1 5 []  - Complex Wound Cleansing - multiple wounds 0 X - Wound Imaging (photographs - any number of wounds) 1 5 []  - Wound Tracing (instead of photographs) 0 X - Simple Wound Measurement - one wound 1 5 []  - Complex Wound Measurement - multiple wounds 0 INTERVENTIONS - Wound Dressings []  - Small Wound Dressing one or multiple wounds 0 []  - Medium Wound Dressing one or multiple wounds 0 X - Large Wound Dressing one or multiple wounds 1 20 X - Application of Medications - topical 1 5 []  - Application of Medications - injection 0 Ardelean, Wilhemenia C. ([id number]) INTERVENTIONS - Miscellaneous []  - External ear exam 0 []  - Specimen Collection (cultures, biopsies, blood, body fluids, etc.) 0 []  - Specimen(s) / Culture(s) sent or taken to Lab for analysis 0 []  - Patient Transfer (multiple staff / Harrel Lemon Lift / Similar devices) 0 []  - Simple Staple / Suture removal (25 or less) 0 []  - Complex Staple / Suture removal (26 or more) 0 []  - Hypo / Hyperglycemic Management (close monitor of Blood Glucose) 0 []  - Ankle / Brachial Index (ABI) - do not check if billed separately 0 X - Vital Signs 1 5 Has the patient been seen at the hospital within the last three years: Yes Total Score: 120 Level Of Care: New/Established - Level 4 Electronic Signature(s) Signed: 07/30/2016 4:30:41 PM By: [name] Entered By: [name] on 07/30/2016 15:31:23 Iannello, [name] ([id number]) -------------------------------------------------------------------------------- Encounter Discharge Information Details Patient Name: Carrie Mulligan C. Date of Service: 07/30/2016 2:30 PM Medical Record Patient Account Number:  [id number] Number: Treating RN: [name] 04/18/1935 (81 y.o. Other Clinician: Date of Birth/Sex: Female) Treating [name], [name] Primary Care Assunta Pupo: [name] Ellyana Crigler/Extender: G Referring Dannica Bickham: [name] in Treatment: 35 Encounter Discharge Information Items Discharge Pain Level: 0 Discharge Condition: Stable Ambulatory Status: Walker Discharge Destination: Home Private Transportation: Auto Accompanied By: caregiver Schedule Follow-up Appointment: Yes Medication Reconciliation completed and No provided to Patient/Care Giannina Bartolome: Clinical Summary of Care: Electronic Signature(s) Signed: 07/30/2016 4:30:41 PM By: [name] Previous Signature: 07/30/2016 3:05:26 PM Version By: Ruthine Dose Entered By: [name] on 07/30/2016 15:08:09 Zima, [name] ([id number]) -------------------------------------------------------------------------------- Lower Extremity Assessment Details Patient Name: [name], Emmalie C. Date of Service: 07/30/2016 2:30 PM Medical Record Patient Account Number: [id number] Number: Treating RN: [name] Jun 16, 1935 (81 y.o. Other Clinician: Date of Birth/Sex: Female) Treating [name], [name] Primary Care Shey Yott: [name] Chandrika Sandles/Extender: G Referring Llesenia Fogal: [name] in Treatment: 35 Edema Assessment Assessed: [Left: No] [Right: No] E[Left: dema] [Right: :] Calf Left: Right: Point of Measurement: 34 cm From Medial Instep 30.4 cm 30.2 cm Ankle Left: Right: Point of Measurement: 10 cm From Medial Instep 21 cm 21 cm Vascular Assessment Pulses: Dorsalis Pedis Palpable: [Left:Yes] [Right:Yes] Posterior Tibial Extremity colors, hair growth, and conditions: Extremity Color: [Left:Mottled] [Right:Mottled] Temperature of Extremity: [Left:Warm] [Right:Warm] Capillary Refill: [Left:< 3 seconds] [Right:< 3 seconds] Electronic Signature(s) Signed: 07/30/2016  4:30:41 PM By: [name] Entered By: [name] on 07/30/2016 14:37:22 Holzman, Mia CMarland Kitchen ([id number]) -------------------------------------------------------------------------------- Multi Wound Chart Details Patient Name: Carrie Mulligan C. Date of Service: 07/30/2016 2:30 PM Medical Record Patient Account Number: [id number] Number: Treating RN: [name] 27-Dec-1934 (81 y.o. Other Clinician: Date of Birth/Sex: Female) Treating [name], [name] Primary Care Yessica Putnam: [name] Giabella Duhart/Extender: G Referring Ahnna Dungan: [name] in Treatment: 35 Vital Signs Height(in): 68 Pulse(bpm): 81 Weight(lbs): 178 Blood Pressure 130/58 (mmHg): Body  Mass Index(BMI): 27 Temperature(F): 97.6 Respiratory Rate 16 (breaths/min): Photos: [8:No Photos] [9:No Photos] [N/A:N/A] Wound Location: [8:Right Lower Leg - Medial] [9:Left Lower Leg - Posterior] [N/A:N/A] Wounding Event: [8:Trauma] [9:Gradually Appeared] [N/A:N/A] Primary Etiology: [8:Venous Leg Ulcer] [9:Venous Leg Ulcer] [N/A:N/A] Comorbid History: [8:Cataracts, Arrhythmia, Hypotension, Osteoarthritis] [9:Cataracts, Arrhythmia, Hypotension, Osteoarthritis] [N/A:N/A] Date Acquired: [8:04/06/2016] [9:06/26/2016] [N/A:N/A] Weeks of Treatment: [8:13] [9:4] [N/A:N/A] Wound Status: [8:Healed - Epithelialized] [9:Open] [N/A:N/A] Measurements L x W x D 0x0x0 [9:2x2x0.2] [N/A:N/A] (cm) Area (cm) : [8:0] [9:3.142] [N/A:N/A] Volume (cm) : [8:0] [9:0.628] [N/A:N/A] % Reduction in Area: [8:100.00%] [9:46.70%] [N/A:N/A] % Reduction in Volume: 100.00% [9:-6.60%] [N/A:N/A] Classification: [8:Full Thickness Without Exposed Support Structures] [9:Full Thickness Without Exposed Support Structures] [N/A:N/A] Exudate Amount: [8:None Present] [9:Large] [N/A:N/A] Exudate Type: [8:N/A] [9:Serosanguineous] [N/A:N/A] Exudate Color: [8:N/A] [9:red, brown] [N/A:N/A] Wound Margin: [8:N/A] [9:Flat and Intact]  [N/A:N/A] Granulation Amount: [8:None Present (0%)] [9:Small (1-33%)] [N/A:N/A] Granulation Quality: [8:N/A] [9:Red] [N/A:N/A] Necrotic Amount: [8:None Present (0%)] [9:Large (67-100%)] [N/A:N/A] Epithelialization: [8:Large (67-100%)] [9:None] [N/A:N/A] Periwound Skin Texture: No Abnormalities Noted [9:No Abnormalities Noted] [N/A:N/A] Periwound Skin No Abnormalities Noted No Abnormalities Noted N/A Moisture: Periwound Skin Color: No Abnormalities Noted No Abnormalities Noted N/A Temperature: N/A No Abnormality N/A Tenderness on No Yes N/A Palpation: Wound Preparation: Ulcer Cleansing: Ulcer Cleansing: N/A Rinsed/Irrigated with Rinsed/Irrigated with Saline Saline Topical Anesthetic Topical Anesthetic Applied: None Applied: Other: lidociane 4% Treatment Notes Wound #9 (Left, Posterior Lower Leg) 1. Cleansed with: Clean wound with Normal Saline Cleanse wound with antibacterial soap and water 2. Anesthetic Topical Lidocaine 4% cream to wound bed prior to debridement 4. Dressing Applied: Iodoflex 5. Secondary Dressing Applied ABD Pad Dry Gauze Notes [email or web address] Electronic Signature(s) Signed: 07/30/2016 5:54:02 PM By: [name] MD Entered By: [name] on 07/30/2016 15:07:33 Burdett, [name] ([id number]) -------------------------------------------------------------------------------- Multi-Disciplinary Care Plan Details Patient Name: FAITHLYNN, DEELEY. Date of Service: 07/30/2016 2:30 PM Medical Record Patient Account Number: [id number] Number: Treating RN: [name] 12-Jul-1934 (81 y.o. Other Clinician: Date of Birth/Sex: Female) Treating [name], [name] Primary Care Liviya Santini: [name] Cotina Freedman/Extender: G Referring Rheya Minogue: [name] in Treatment: 35 Active Inactive ` Orientation to the Wound Care Program Nursing Diagnoses: Knowledge deficit related to the wound healing center program Goals: Patient/caregiver will  verbalize understanding of the [place] Date Initiated: 11/22/2015 Target Resolution Date: 09/28/2016 Goal Status: Active Interventions: Provide education on orientation to the wound center Notes: ` Venous Leg Ulcer Nursing Diagnoses: Knowledge deficit related to disease process and management Potential for venous Insuffiency (use before diagnosis confirmed) Goals: Patient will maintain optimal edema control Date Initiated: 11/22/2015 Target Resolution Date: 09/28/2016 Goal Status: Active Patient/caregiver will verbalize understanding of disease process and disease management Date Initiated: 11/22/2015 Target Resolution Date: 09/28/2016 Goal Status: Active Verify adequate tissue perfusion prior to therapeutic compression application Date Initiated: 11/22/2015 Target Resolution Date: 09/28/2016 Goal Status: Active Interventions: AUDIANNA, LANDGREN ([id number]) Assess peripheral edema status every visit. Compression as ordered Provide education on venous insufficiency Treatment Activities: Non-invasive vascular studies : 11/22/2015 Therapeutic compression applied : 11/22/2015 Notes: ` Wound/Skin Impairment Nursing Diagnoses: Impaired tissue integrity Knowledge deficit related to ulceration/compromised skin integrity Goals: Patient/caregiver will verbalize understanding of skin care regimen Date Initiated: 11/22/2015 Target Resolution Date: 09/28/2016 Goal Status: Active Ulcer/skin breakdown will have a volume reduction of 30% by week 4 Date Initiated: 11/22/2015 Target Resolution Date: 09/28/2016 Goal Status: Active Ulcer/skin breakdown will have a volume reduction of 50% by week 8 Date Initiated: 11/22/2015 Target Resolution Date: 09/28/2016  Goal Status: Active Ulcer/skin breakdown will have a volume reduction of 80% by week 12 Date Initiated: 11/22/2015 Target Resolution Date: 09/28/2016 Goal Status: Active Ulcer/skin breakdown will heal within 14  weeks Date Initiated: 11/22/2015 Target Resolution Date: 09/28/2016 Goal Status: Active Interventions: Assess patient/caregiver ability to perform ulcer/skin care regimen upon admission and as needed Assess ulceration(s) every visit Provide education on ulcer and skin care Treatment Activities: Skin care regimen initiated : 11/22/2015 Topical wound management initiated : 11/22/2015 Notes: ELAF, CLAUSON ([id number]) Electronic Signature(s) Signed: 07/30/2016 4:30:41 PM By: [name] Entered By: [name] on 07/30/2016 14:55:47 Iseminger, Bascom. ([id number]) -------------------------------------------------------------------------------- Pain Assessment Details Patient Name: Carrie Mulligan C. Date of Service: 07/30/2016 2:30 PM Medical Record Patient Account Number: [id number] Number: Treating RN: [name] [birth year] (81 y.o. Other Clinician: Date of Birth/Sex: Female) Treating [name], [name] Primary Care Monico Sudduth: [name] Prathik Aman/Extender: G Referring Nyonna Hargrove: [name] in Treatment: 35 Active Problems Location of Pain Severity and Description of Pain Patient Has Paino No Site Locations With Dressing Change: No Pain Management and Medication Current Pain Management: Electronic Signature(s) Signed: 07/30/2016 4:30:41 PM By: [name] Entered By: [name] on 07/30/2016 14:27:46 Forton, Nakota Loletha Grayer ([id number]) -------------------------------------------------------------------------------- Patient/Caregiver Education Details Patient Name: [name] Date of Service: 07/30/2016 2:30 PM Medical Record Patient Account Number: [id number] Number: Treating RN: [name] Feb 22, 1935 (81 y.o. Other Clinician: Date of Birth/Gender: Female) Treating [name], [name] Primary Care Physician/Extender: Claudette Laws Physician: Suella [place] in Treatment: 35 Referring Physician: [name] Education Assessment Education Provided To: Patient Education Topics Provided Wound/Skin Impairment: Handouts: Other: do not get wrap wet Methods: Demonstration, Explain/Verbal Responses: State content correctly Electronic Signature(s) Signed: 07/30/2016 4:30:41 PM By: [name] Entered By: [name] on 07/30/2016 15:08:22 Keirsey, [name] ([id number]) -------------------------------------------------------------------------------- Wound Assessment Details Patient Name: [name], Taneah C. Date of Service: 07/30/2016 2:30 PM Medical Record Patient Account Number: [id number] Number: Treating RN: [name] Aug 18, 1934 (81 y.o. Other Clinician: Date of Birth/Sex: Female) Treating [name], [name] Primary Care Skarlet Lyons: [name] Terrye Dombrosky/Extender: G Referring Taniela Feltus: [name] in Treatment: 35 Wound Status Wound Number: 8 Primary Venous Leg Ulcer Etiology: Wound Location: Right Lower Leg - Medial Wound Status: Healed - Epithelialized Wounding Event: Trauma Comorbid Cataracts, Arrhythmia, Hypotension, Date Acquired: 04/06/2016 History: Osteoarthritis Weeks Of Treatment: 13 Clustered Wound: No Photos Photo Uploaded By: [name] on 07/30/2016 15:21:59 Wound Measurements Length: (cm) 0 % Reduction i Width: (cm) 0 % Reduction i Depth: (cm) 0 Epithelializa Area: (cm) 0 Tunneling: Volume: (cm) 0 Undermining: n Area: 100% n Volume: 100% tion: Large (67-100%) No No Wound Description Full Thickness Without Exposed Foul Odor Aft Classification: Support Structures Slough/Fibrin Exudate None Present Amount: er Cleansing: No o No Wound Bed Granulation Amount: None Present (0%) Necrotic Amount: None Present (0%) Periwound Skin Texture Fichera, Christian C. ([id number]) Texture Color No Abnormalities Noted: No No Abnormalities Noted: No Moisture No Abnormalities Noted: No Wound Preparation Ulcer  Cleansing: Rinsed/Irrigated with Saline Topical Anesthetic Applied: None Electronic Signature(s) Signed: 07/30/2016 4:30:41 PM By: [name] Entered By: [name] on 07/30/2016 15:03:50 Torry, Madelein Loletha Grayer ([id number]) -------------------------------------------------------------------------------- Wound Assessment Details Patient Name: [name], Melvena C. Date of Service: 07/30/2016 2:30 PM Medical Record Patient Account Number: [id number] Number: Treating RN: [name] [birth year] (81 y.o. Other Clinician: Date of Birth/Sex: Female) Treating [name], [name] Primary Care Nox Talent: [name] Charmine Bockrath/Extender: G Referring Barrett Holthaus: [name] in Treatment: 35 Wound Status Wound Number: 9 Primary Venous Leg Ulcer  Etiology: Wound Location: Left Lower Leg - Posterior Wound Status: Open Wounding Event: Gradually Appeared Comorbid Cataracts, Arrhythmia, Hypotension, Date Acquired: 06/26/2016 History: Osteoarthritis Weeks Of Treatment: 4 Clustered Wound: No Photos Photo Uploaded By: [name] on 07/30/2016 15:22:00 Wound Measurements Length: (cm) 2 Width: (cm) 2 Depth: (cm) 0.2 Area: (cm) 3.142 Volume: (cm) 0.628 % Reduction in Area: 46.7% % Reduction in Volume: -6.6% Epithelialization: None Tunneling: No Undermining: No Wound Description Full Thickness Without Exposed Classification: Support Structures Wound Margin: Flat and Intact Exudate Large Amount: Exudate Type: Serosanguineous Exudate Color: red, brown Foul Odor After Cleansing: No Wound Bed Granulation Amount: Small (1-33%) Exposed Structure Mccorvey, Dayleen C. ([id number]) Granulation Quality: Red Fascia Exposed: No Necrotic Amount: Large (67-100%) Fat Layer (Subcutaneous Tissue) Exposed: No Necrotic Quality: Adherent Slough Tendon Exposed: No Muscle Exposed: No Joint Exposed: No Bone Exposed: No Limited to Skin Breakdown Periwound Skin  Texture Texture Color No Abnormalities Noted: No No Abnormalities Noted: No Moisture Temperature / Pain No Abnormalities Noted: No Temperature: No Abnormality Tenderness on Palpation: Yes Wound Preparation Ulcer Cleansing: Rinsed/Irrigated with Saline Topical Anesthetic Applied: Other: lidociane 4%, Treatment Notes Wound #9 (Left, Posterior Lower Leg) 1. Cleansed with: Clean wound with Normal Saline Cleanse wound with antibacterial soap and water 2. Anesthetic Topical Lidocaine 4% cream to wound bed prior to debridement 4. Dressing Applied: Iodoflex 5. Secondary Dressing Applied ABD Pad Dry Gauze Notes [email or web address] Electronic Signature(s) Signed: 07/30/2016 4:30:41 PM By: [name] Entered By: [name] on 07/30/2016 14:35:15 Doughten, [name] ([id number]) -------------------------------------------------------------------------------- Vitals Details Patient Name: Carrie Mulligan C. Date of Service: 07/30/2016 2:30 PM Medical Record Patient Account Number: [id number] Number: Treating RN: [name] 1934-08-02 (81 y.o. Other Clinician: Date of Birth/Sex: Female) Treating [name], [name] Primary Care Tony Granquist: [name] Neeta Storey/Extender: G Referring Ferlando Lia: [name] in Treatment: 35 Vital Signs Time Taken: 14:27 Temperature (F): 97.6 Height (in): 68 Pulse (bpm): 81 Weight (lbs): 178 Respiratory Rate (breaths/min): 16 Body Mass Index (BMI): 27.1 Blood Pressure (mmHg): 130/58 Reference Range: 80 - 120 mg / dl Electronic Signature(s) Signed: 07/30/2016 4:30:41 PM By: [name] Entered By: [name] on 07/30/2016 14:28:20

## 2016-08-01 ENCOUNTER — Ambulatory Visit: Payer: Medicare Other | Admitting: Cardiovascular Disease

## 2016-08-02 NOTE — Progress Notes (Addendum)
Location:      Place of Service:  SNF (31) Provider:  Toni Arthurs, NP-C  Carrie Aus, MD  Patient Care Team: Carrie Aus, MD as PCP - General (Internal Medicine) Carrie Bellow, MD as Consulting Physician (General Surgery)  Extended Emergency Contact Information Primary Emergency Contact: [name] Address: [address]          [place], [place] [address] [name] Phone: [phone] Relation: Daughter Secondary Emergency Contact: [name] [place] of [place] Mobile Phone: [phone] Relation: Son  Code Status:  DNR Goals of care: Advanced Directive information Advanced Directives 06/13/2016  Does Patient Have a Medical Advance Directive? No  Would patient like information on creating a medical advance directive? No - Patient declined     Chief Complaint  Patient presents with  . Follow-up  . Medication Management    HPI:  Pt is a 82 y.o. female seen today for management of Coumadin (Warfarin) for VTE Prophylaxis in the setting of afib and a mechanical heart valve. Pt has been complaint with medication regimen and is aware of dietary modifications needed. Pt is aware of importance of continued compliance with medication and testing. Pt has not displayed any adverse effects related to anticoagulant therapy such as unexplained or excessive bleeding, bruising, hematuria, hematemesis, melena. Heart rate is controlled. INR is slightly subtherapeutic today at 2.49. Continue current dose. Recheck INR closely. Pt reports she is having minimal pain now. She appears relaxed and is smiling. Will start to wean meds. Pt agreeable to this. No other complaints. Vital signs are stable.    Past Medical History:  Diagnosis Date  . Arthritis    "back, hands, knees" (04/10/2016)  . Chronic atrial fibrillation ([place])    a. On Coumadin  . Chronic back pain   . Chronic combined systolic (congestive) and diastolic (congestive) heart failure    a.  EF 25% by echo in 08/2015 b. RHC in 08/2015 showed normal filling pressures  . Compression fracture    "several; all in my back" (04/10/2016)  . GERD (gastroesophageal reflux disease)   . Heart murmur   . Hypertension   . S/P MVR (mitral valve replacement)    a. MVR 1994 b. redo MVR in 08/2014 - on Coumadin  . Shortness of breath dyspnea    Past Surgical History:  Procedure Laterality Date  . BREAST LUMPECTOMY Right 2005?   benign lump excision  . CARDIAC CATHETERIZATION    . [place], 2016   "MVR; MVR"  . CATARACT EXTRACTION W/PHACO Right 02/20/2015   Procedure: CATARACT EXTRACTION PHACO AND INTRAOCULAR LENS PLACEMENT (IOC);  Surgeon: Carrie Cotta, MD;  Location: [facility] ORS;  Service: Ophthalmology;  Laterality: Right;  US01:31.2 AP 25.3% CDE40.13 Fluid lot # [id number]  . ELECTROPHYSIOLOGIC STUDY N/A 10/09/2015   Procedure: CARDIOVERSION;  Surgeon: Carrie Hampshire, MD;  Location: [facility] OR[facility];  Service: Cardiovascular;  Laterality: N/A;  . IR GENERIC HISTORICAL  04/01/2016   IR RADIOLOGIST EVAL & MGMT 04/01/2016 [facility]  . IR GENERIC HISTORICAL  04/15/2016   IR VERTEBROPLASTY CERV/THOR BX INC UNI/BIL INC/INJECT/IMAGING 04/15/2016 Carrie Bras, MD [facility]  . IR GENERIC HISTORICAL  04/15/2016   IR VERTEBROPLASTY EA ADDL (T&LS) BX INC UNI/BIL INC INJECT/IMAGING 04/15/2016 Carrie Bras, MD [facility]  . IR GENERIC HISTORICAL  04/15/2016   IR VERTEBROPLASTY EA ADDL (T&LS) BX INC UNI/BIL INC INJECT/IMAGING 04/15/2016 Carrie Bras, MD [facility]  . IR GENERIC HISTORICAL  05/20/2016  IR RADIOLOGIST EVAL & MGMT 05/20/2016 [facility]  . IR GENERIC HISTORICAL  06/13/2016   IR VERTEBROPLASTY EA ADDL (T&LS) BX INC UNI/BIL INC INJECT/IMAGING 06/13/2016 Carrie Bras, MD [facility]  . IR GENERIC HISTORICAL  06/13/2016   IR SACROPLASTY BILATERAL 06/13/2016 Carrie Bras, MD [facility]  . IR GENERIC HISTORICAL  06/13/2016    IR VERTEBROPLASTY LUMBAR BX INC UNI/BIL INC/INJECT/IMAGING 06/13/2016 Carrie Bras, MD [facility]  . IRRIGATION AND DEBRIDEMENT HEMATOMA Left 07/05/2015   Procedure: IRRIGATION AND DEBRIDEMENT HEMATOMA;  Surgeon: Carrie Bellow, MD;  Location: [facility] OR[facility];  Service: General;  Laterality: Left;  . pyloric stenosis  07/1937  . US ECHOCARDIOGRAPHY      Allergies  Allergen Reactions  . Ace Inhibitors     Other reaction(s): Cough  . Hydrocodone Itching and Nausea Only  . Mercury     Other reaction(s): Unknown  . Nickel   . Silver Dermatitis    Severe itching  . Cephalexin Rash  . Clindamycin/Lincomycin Rash  . Penicillins Rash    Has patient had a PCN reaction causing immediate rash, facial/tongue/throat swelling, SOB or lightheadedness with hypotension: YES Has patient had a PCN reaction causing severe rash involving mucus membranes or skin necrosis: NO Has patient had a PCN reaction that required hospitalization NO Has patient had a PCN reaction occurring within the last 10 years: NO If all of the above answers are "NO", then may proceed with Cephalosporin use.    Allergies as of 07/22/2016      Reactions   Ace Inhibitors    Other reaction(s): Cough   Hydrocodone Itching, Nausea Only   Mercury    Other reaction(s): Unknown   Nickel    Silver Dermatitis   Severe itching   Cephalexin Rash   Clindamycin/lincomycin Rash   Penicillins Rash   Has patient had a PCN reaction causing immediate rash, facial/tongue/throat swelling, SOB or lightheadedness with hypotension: YES Has patient had a PCN reaction causing severe rash involving mucus membranes or skin necrosis: NO Has patient had a PCN reaction that required hospitalization NO Has patient had a PCN reaction occurring within the last 10 years: NO If all of the above answers are "NO", then may proceed with Cephalosporin use.      Medication List       Accurate as of 07/22/16 11:59 PM. Always use your most recent med  list.          acetaminophen 500 MG tablet Commonly known as:  TYLENOL Take 500 mg by mouth every 6 (six) hours as needed for mild pain, moderate pain, fever or headache.   acetaminophen 325 MG tablet Commonly known as:  TYLENOL Take 650 mg by mouth 4 (four) times daily.   aspirin EC 81 MG tablet Take 81 mg by mouth daily.   calcium carbonate 500 MG chewable tablet Commonly known as:  TUMS - dosed in mg elemental calcium Chew 1 tablet by mouth every 4 (four) hours as needed for indigestion or heartburn.   diclofenac 1.3 % Ptch Commonly known as:  FLECTOR Place 1 patch onto the skin 2 (two) times daily. To the greatest pain in lower back   enoxaparin 40 MG/0.4ML injection Commonly known as:  LOVENOX Inject 40 mg into the skin daily.   feeding supplement (ENSURE ENLIVE) Liqd Take 237 mLs by mouth 2 (two) times daily between meals.   furosemide 40 MG tablet Commonly known as:  LASIX Take 40 mg by mouth 2 (two) times daily.  HM VITAMIN D3 4000 units Caps Generic drug:  Cholecalciferol Take 1 capsule by mouth daily.   KLOR-CON 10 10 MEQ tablet Generic drug:  potassium chloride Take 2 capsules by mouth daily.   liver oil-zinc oxide 40 % ointment Commonly known as:  DESITIN Apply 1 application topically as needed (bed sores).   methocarbamol 500 MG tablet Commonly known as:  ROBAXIN Take 250 mg by mouth 4 (four) times daily.   metoprolol succinate 25 MG 24 hr tablet Commonly known as:  TOPROL-XL Take 1 tablet (25 mg total) by mouth daily.   mirtazapine 15 MG tablet Commonly known as:  REMERON Take 15 mg by mouth at bedtime.   oxyCODONE 5 MG immediate release tablet Commonly known as:  Oxy IR/ROXICODONE Take 5 mg by mouth every 4 (four) hours as needed for moderate pain, severe pain or breakthrough pain.   oxyCODONE 5 MG immediate release tablet Commonly known as:  Oxy IR/ROXICODONE Take 1 tablet (5 mg total) by mouth every 4 (four) hours.   pantoprazole 40  MG tablet Commonly known as:  PROTONIX Take 40 mg by mouth daily.   polyethylene glycol packet Commonly known as:  MIRALAX / GLYCOLAX Take 17 g by mouth 2 (two) times daily.   RISA-BID PROBIOTIC PO Take 1 capsule by mouth 2 (two) times daily.   senna-docusate 8.6-50 MG tablet Commonly known as:  Senokot-S Take 1 tablet by mouth 2 (two) times daily.   spironolactone 25 MG tablet Commonly known as:  ALDACTONE Take 25 mg by mouth 2 (two) times daily.   traZODone 50 MG tablet Commonly known as:  DESYREL Take 50 mg by mouth at bedtime as needed for sleep.   warfarin 5 MG tablet Commonly known as:  COUMADIN Take 1 tablet (5 mg total) by mouth daily.       Review of Systems  Constitutional: Negative for activity change, appetite change, chills, diaphoresis and fever.  Respiratory: Negative for apnea, cough, choking, chest tightness, shortness of breath and wheezing.   Cardiovascular: Negative for chest pain, palpitations and leg swelling.  Gastrointestinal: Negative for abdominal distention, abdominal pain, anal bleeding, blood in stool, constipation, diarrhea and nausea.  Genitourinary: Negative for difficulty urinating, dysuria, frequency, hematuria, urgency and vaginal bleeding.  Musculoskeletal: Positive for arthralgias (typical arthritis), back pain and myalgias. Negative for gait problem.  Skin: Positive for wound. Negative for color change, pallor and rash.  Neurological: Negative for dizziness, tremors, syncope, speech difficulty, weakness, numbness and headaches.  All other systems reviewed and are negative.    There is no immunization history on file for this patient. Pertinent  Health Maintenance Due  Topic Date Due  . DEXA SCAN  06/28/2002  . PNA vac Low Risk Adult (1 of 2 - PCV13) 06/28/2002  . INFLUENZA VACCINE  01/30/2016   Fall Risk  09/25/2015  Falls in the past year? No   Functional Status Survey:    Vitals:   07/22/16 0430  BP: (!) 123/53  Pulse:  73  Resp: 18  Temp: 98 F (36.7 C)  SpO2: 95%  Weight: 143 lb 12.8 oz (65.2 kg)   Body mass index is 21.55 kg/m. Physical Exam  Constitutional: She is oriented to person, place, and time. Vital signs are normal. She appears well-developed and well-nourished. She is active and cooperative. She does not appear ill. No distress.  HENT:  Head: Normocephalic and atraumatic.  Mouth/Throat: Uvula is midline, oropharynx is clear and moist and mucous membranes are normal. Mucous membranes  are not pale, not dry and not cyanotic.  Eyes: Conjunctivae, EOM and lids are normal. Pupils are equal, round, and reactive to light.  Neck: Trachea normal, normal range of motion and full passive range of motion without pain. Neck supple. No JVD present. No tracheal deviation, no edema and no erythema present. No thyromegaly present.  Cardiovascular: Normal rate, regular rhythm, normal heart sounds, intact distal pulses and normal pulses.  Exam reveals no gallop, no distant heart sounds and no friction rub.   No murmur heard. Pulmonary/Chest: Effort normal and breath sounds normal. No accessory muscle usage. No respiratory distress. She has no wheezes. She has no rales. She exhibits no tenderness.  Abdominal: Normal appearance and bowel sounds are normal. She exhibits no distension and no ascites. There is no tenderness.  Musculoskeletal: She exhibits no edema.       Lumbar back: She exhibits decreased range of motion (multiple compression fractures), tenderness, bony tenderness and pain (improved).  Expected osteoarthritis, stiffness  Neurological: She is alert and oriented to person, place, and time. She has normal strength.  Skin: Skin is warm, dry and intact. No lesion and no rash noted. She is not diaphoretic. No cyanosis or erythema. No pallor. Nails show no clubbing.  Psychiatric: She has a normal mood and affect. Her speech is normal and behavior is normal. Judgment and thought content normal. Cognition and  memory are normal.  Nursing note and vitals reviewed.   Labs reviewed:  Recent Labs  06/27/16 0700 06/29/16 0515 07/01/16 0450  NA 136 138 137  K 4.7 5.0 5.2*  CL 101 102 101  CO2 27 28 27   GLUCOSE 82 91 87  BUN 31* 33* 38*  CREATININE 1.51* 1.39* 1.59*  CALCIUM 10.2 10.5* 11.0*    Recent Labs  05/20/16 0435 05/21/16 1625 06/01/16 0500  AST 24 25 18   ALT 18 19 16   ALKPHOS 122 151* 129*  BILITOT 0.9 0.6 1.0  PROT 7.0 7.5 6.7  ALBUMIN 3.9 4.3 3.5    Recent Labs  06/27/16 0700 06/29/16 0515 07/01/16 0450  WBC 3.3* 3.2* 3.7  NEUTROABS 1.7 1.4 1.9  HGB 11.2* 11.4* 11.7*  HCT 34.2* 34.5* 35.5  MCV 83.1 83.0 82.3  PLT 205 211 220   Lab Results  Component Value Date   TSH 2.239 04/13/2016   No results found for: HGBA1C No results found for: CHOL, HDL, LDLCALC, LDLDIRECT, TRIG, CHOLHDL  Significant Diagnostic Results in last 30 days:  No results found.  Assessment/Plan  1. Z51.81-  Encounter for Therapeutic Drug Level Monitoring  Coumadin 6 mg daily except 1/2 tablet (3 mg) Sunday and Wednesdays  Recheck INR 1 day  Monitor for s/s of bleeding, bruising, hematuria, hematemesis, melena  Check INR 3 days after initiation of antibiotic, when applicable.  2. Pain, acute postoperative  Continue Oxycodone 5 mg po Q 4 hours prn  Methocarbamol 500 mg one half tablet by mouth 4 times a day scheduled  Continue Flector patch   Continue Tylenol 650 mg by mouth 4 times a day scheduled   Family/ staff Communication:   Total Time:  Documentation:  Face to Face:  Family/Phone:   Labs/tests ordered:  PT/INR in 1 day  Patient is being discharged with the following home health services:  HHPT/OT/NSG/Aid  Patient is being discharged with the following durable medical equipment:  none  Patient has been advised to f/u with their PCP in 1-2 weeks to bring them up to date on their rehab stay.  Social services at facility was responsible for arranging this  appointment.  Pt was provided with a 30 day supply of prescriptions for medications and refills must be obtained from their PCP.  For controlled substances, a more limited supply may be provided adequate until PCP appointment only.  Medication list reviewed and assessed for continued appropriateness.  Vikki Ports, NP-C [facility] [phone], [place] [address] Cell Phone (Mon-Fri 8am-5pm):  [phone] On Call:  [phone] & follow prompts after 5pm & weekends Office Phone:  [phone] Office Fax:  [phone]

## 2016-08-07 ENCOUNTER — Encounter: Payer: Medicare Other | Attending: Internal Medicine | Admitting: Internal Medicine

## 2016-08-07 DIAGNOSIS — I87333 Chronic venous hypertension (idiopathic) with ulcer and inflammation of bilateral lower extremity: Secondary | ICD-10-CM | POA: Insufficient documentation

## 2016-08-07 DIAGNOSIS — I890 Lymphedema, not elsewhere classified: Secondary | ICD-10-CM | POA: Diagnosis not present

## 2016-08-07 DIAGNOSIS — Z79899 Other long term (current) drug therapy: Secondary | ICD-10-CM | POA: Diagnosis not present

## 2016-08-07 DIAGNOSIS — Z952 Presence of prosthetic heart valve: Secondary | ICD-10-CM | POA: Diagnosis not present

## 2016-08-07 DIAGNOSIS — L97812 Non-pressure chronic ulcer of other part of right lower leg with fat layer exposed: Secondary | ICD-10-CM | POA: Diagnosis not present

## 2016-08-07 DIAGNOSIS — I5022 Chronic systolic (congestive) heart failure: Secondary | ICD-10-CM | POA: Diagnosis not present

## 2016-08-07 DIAGNOSIS — Z7901 Long term (current) use of anticoagulants: Secondary | ICD-10-CM | POA: Diagnosis not present

## 2016-08-07 DIAGNOSIS — I4891 Unspecified atrial fibrillation: Secondary | ICD-10-CM | POA: Insufficient documentation

## 2016-08-07 DIAGNOSIS — L97221 Non-pressure chronic ulcer of left calf limited to breakdown of skin: Secondary | ICD-10-CM | POA: Diagnosis not present

## 2016-08-08 NOTE — Progress Notes (Signed)
PETRITA, BLUNCK ([id number]) Visit Report for 08/07/2016 Arrival Information Details Patient Name: [name], [name]. Date of Service: 08/07/2016 1:30 PM Medical Record Patient Account Number: [id number] Number: Treating RN: Cornell Barman 14-May-1935 (82 y.o. Other Clinician: Date of Birth/Sex: Female) Treating [name], [name] Primary Care Carita Sollars: [name] Leahna Hewson/Extender: G Referring Shell Blanchette: [name] in Treatment: 36 Visit Information History Since Last Visit Added or deleted any medications: No Patient Arrived: Wheel Chair Any new allergies or adverse reactions: No Arrival Time: 13:44 Had a fall or experienced change in No Accompanied By: son activities of daily living that may affect Transfer Assistance: None risk of falls: Patient Identification Verified: Yes Signs or symptoms of abuse/neglect since last No Secondary Verification Process Yes visito Completed: Hospitalized since last visit: No Patient Requires Transmission- No Has Dressing in Place as Prescribed: Yes Based Precautions: Pain Present Now: No Patient Has Alerts: Yes Patient Alerts: Patient on Blood Thinner [place]!!! Electronic Signature(s) Signed: 08/07/2016 5:04:45 PM By: Gretta Cool, RN, BSN, Kim RN, BSN Entered By: Gretta Cool, RN, BSN, Kim on 08/07/2016 13:45:13 Pack, [name] ([id number]) -------------------------------------------------------------------------------- Encounter Discharge Information Details Patient Name: Carrie Mulligan C. Date of Service: 08/07/2016 1:30 PM Medical Record Patient Account Number: [id number] Number: Treating RN: Cornell Barman 31-Aug-1934 (82 y.o. Other Clinician: Date of Birth/Sex: Female) Treating [name], [name] Primary Care Caleen Taaffe: [name] Chasta Deshpande/Extender: G Referring Spruha Weight: [name] in Treatment: 37 Encounter Discharge Information Items Discharge Pain Level:  0 Discharge Condition: Stable Ambulatory Status: Walker Discharge Destination: Home Transportation: Private Auto Accompanied By: son Schedule Follow-up Appointment: Yes Medication Reconciliation completed Yes and provided to Patient/Care Ether Wolters: Provided on Clinical Summary of Care: 08/07/2016 Form Type Recipient Paper Patient [place] Electronic Signature(s) Signed: 08/07/2016 5:04:45 PM By: Gretta Cool RN, BSN, Kim RN, BSN Previous Signature: 08/07/2016 2:18:00 PM Version By: Ruthine Dose Entered By: Gretta Cool RN, BSN, Kim on 08/07/2016 14:19:47 Schweickert, [name] ([id number]) -------------------------------------------------------------------------------- Lower Extremity Assessment Details Patient Name: [name], [name] Date of Service: 08/07/2016 1:30 PM Medical Record Patient Account Number: [id number] Number: Treating RN: Cornell Barman [birth year] (82 y.o. Other Clinician: Date of Birth/Sex: Female) Treating [name], [name] Primary Care Danzel Marszalek: [name] Cyree Chuong/Extender: G Referring Jaydien Panepinto: [name] in Treatment: 37 Vascular Assessment Claudication: Claudication Assessment [Left:None] Pulses: Dorsalis Pedis Palpable: [Left:Yes] Posterior Tibial Extremity colors, hair growth, and conditions: Extremity Color: [Left:Hyperpigmented] Hair Growth on Extremity: [Left:No] Temperature of Extremity: [Left:Warm] Capillary Refill: [Left:< 3 seconds] Dependent Rubor: [Left:No] Blanched when Elevated: [Left:No] Lipodermatosclerosis: [Left:No] Toe Nail Assessment Left: Right: Thick: No Discolored: No Deformed: No Improper Length and Hygiene: No Electronic Signature(s) Signed: 08/07/2016 5:04:45 PM By: Gretta Cool, RN, BSN, Kim RN, BSN Entered By: Gretta Cool, RN, BSN, Kim on 08/07/2016 14:04:39 Faxon, [name] ([id number]) -------------------------------------------------------------------------------- Multi Wound Chart Details Patient Name: Carrie Mulligan C.  Date of Service: 08/07/2016 1:30 PM Medical Record Patient Account Number: [id number] Number: Treating RN: Cornell Barman [birth year] (82 y.o. Other Clinician: Date of Birth/Sex: Female) Treating [name], [name] Primary Care Matis Monnier: [name] Linnea Todisco/Extender: G Referring Joquan Lotz: [name] in Treatment: 37 Vital Signs Height(in): 68 Pulse(bpm): 91 Weight(lbs): 178 Blood Pressure 132/67 (mmHg): Body Mass Index(BMI): 27 Temperature(F): 98.0 Respiratory Rate 16 (breaths/min): Photos: [N/A:N/A] Wound Location: Left Lower Leg - Posterior N/A N/A Wounding Event: Gradually Appeared N/A N/A Primary Etiology: Venous Leg Ulcer N/A N/A Comorbid History: Cataracts, Arrhythmia, N/A N/A Hypotension, Osteoarthritis Date Acquired: 06/26/2016 N/A N/A Weeks of Treatment: 6 N/A N/A Wound Status: Open N/A N/A Measurements  L x W x D 1.8x0.8x0.1 N/A N/A (cm) Area (cm) : 1.131 N/A N/A Volume (cm) : 0.113 N/A N/A % Reduction in Area: 80.80% N/A N/A % Reduction in Volume: 80.80% N/A N/A Classification: Full Thickness Without N/A N/A Exposed Support Structures Exudate Amount: Large N/A N/A Exudate Type: Serosanguineous N/A N/A Exudate Color: red, brown N/A N/A Wound Margin: Flat and Intact N/A N/A Herskowitz, Yurika C. ([id number]) Granulation Amount: Small (1-33%) N/A N/A Granulation Quality: Red N/A N/A Necrotic Amount: Large (67-100%) N/A N/A Exposed Structures: Fascia: No N/A N/A Fat Layer (Subcutaneous Tissue) Exposed: No Tendon: No Muscle: No Joint: No Bone: No Limited to Skin Breakdown Epithelialization: None N/A N/A Debridement: Debridement (17510- N/A N/A 11047) Pre-procedure 14:05 N/A N/A Verification/Time Out Taken: Pain Control: Other N/A N/A Tissue Debrided: Fibrin/Slough, N/A N/A Subcutaneous Level: Skin/Subcutaneous N/A N/A Tissue Debridement Area (sq 3.24 N/A N/A cm): Instrument: Curette N/A N/A Bleeding: Minimum N/A  N/A Hemostasis Achieved: Pressure N/A N/A Procedural Pain: 0 N/A N/A Post Procedural Pain: 0 N/A N/A Debridement Treatment Procedure was tolerated N/A N/A Response: well Post Debridement 1.8x1.8x0.5 N/A N/A Measurements L x W x D (cm) Post Debridement 1.272 N/A N/A Volume: (cm) Periwound Skin Texture: No Abnormalities Noted N/A N/A Periwound Skin No Abnormalities Noted N/A N/A Moisture: Periwound Skin Color: Ecchymosis: Yes N/A N/A Temperature: No Abnormality N/A N/A Tenderness on Yes N/A N/A Palpation: Wound Preparation: Ulcer Cleansing: N/A N/A Rinsed/Irrigated with Saline Topical Anesthetic Carmon, Princetta C. ([id number]) Applied: Other: lidociane 4% Procedures Performed: Debridement N/A N/A Treatment Notes Wound #9 (Left, Posterior Lower Leg) 1. Cleansed with: Cleanse wound with antibacterial soap and water 2. Anesthetic Topical Lidocaine 4% cream to wound bed prior to debridement 4. Dressing Applied: Promogran 5. Secondary Dressing Applied ABD Pad Notes kerlix and coban, Foam to protect hematoma at top of wrap. Electronic Signature(s) Signed: 08/07/2016 4:28:51 PM By: [name] MD Entered By: [name] on 08/07/2016 16:22:24 [name] ([id number]) -------------------------------------------------------------------------------- Multi-Disciplinary Care Plan Details Patient Name: DARL, BRISBIN. Date of Service: 08/07/2016 1:30 PM Medical Record Patient Account Number: [id number] Number: Treating RN: Cornell Barman 1934/09/21 (81 y.o. Other Clinician: Date of Birth/Sex: Female) Treating [name], [name] Primary Care Dulcey Riederer: [name] Urijah Raynor/Extender: G Referring Fayola Meckes: [name] in Treatment: 37 Active Inactive ` Orientation to the Wound Care Program Nursing Diagnoses: Knowledge deficit related to the wound healing center program Goals: Patient/caregiver will verbalize understanding of the [place] Date Initiated: 11/22/2015 Target Resolution Date: 09/28/2016 Goal Status: Active Interventions: Provide education on orientation to the wound center Notes: ` Venous Leg Ulcer Nursing Diagnoses: Knowledge deficit related to disease process and management Potential for venous Insuffiency (use before diagnosis confirmed) Goals: Patient will maintain optimal edema control Date Initiated: 11/22/2015 Target Resolution Date: 09/28/2016 Goal Status: Active Patient/caregiver will verbalize understanding of disease process and disease management Date Initiated: 11/22/2015 Target Resolution Date: 09/28/2016 Goal Status: Active Verify adequate tissue perfusion prior to therapeutic compression application Date Initiated: 11/22/2015 Target Resolution Date: 09/28/2016 Goal Status: Active Interventions: RHYANNA, SORCE ([id number]) Assess peripheral edema status every visit. Compression as ordered Provide education on venous insufficiency Treatment Activities: Non-invasive vascular studies : 11/22/2015 Therapeutic compression applied : 11/22/2015 Notes: ` Wound/Skin Impairment Nursing Diagnoses: Impaired tissue integrity Knowledge deficit related to ulceration/compromised skin integrity Goals: Patient/caregiver will verbalize understanding of skin care regimen Date Initiated: 11/22/2015 Target Resolution Date: 09/28/2016 Goal Status: Active Ulcer/skin breakdown will have a volume reduction of 30% by week 4 Date  Initiated: 11/22/2015 Target Resolution Date: 09/28/2016 Goal Status: Active Ulcer/skin breakdown will have a volume reduction of 50% by week 8 Date Initiated: 11/22/2015 Target Resolution Date: 09/28/2016 Goal Status: Active Ulcer/skin breakdown will have a volume reduction of 80% by week 12 Date Initiated: 11/22/2015 Target Resolution Date: 09/28/2016 Goal Status: Active Ulcer/skin breakdown will heal within 14 weeks Date Initiated: 11/22/2015 Target Resolution  Date: 09/28/2016 Goal Status: Active Interventions: Assess patient/caregiver ability to perform ulcer/skin care regimen upon admission and as needed Assess ulceration(s) every visit Provide education on ulcer and skin care Treatment Activities: Skin care regimen initiated : 11/22/2015 Topical wound management initiated : 11/22/2015 Notes: SADEE, OSLAND ([id number]) Electronic Signature(s) Signed: 08/07/2016 5:04:45 PM By: Gretta Cool, RN, BSN, Kim RN, BSN Entered By: Gretta Cool, RN, BSN, Kim on 08/07/2016 14:05:06 Haddix, [name] ([id number]) -------------------------------------------------------------------------------- Pain Assessment Details Patient Name: Carrie Mulligan C. Date of Service: 08/07/2016 1:30 PM Medical Record Patient Account Number: [id number] Number: Treating RN: Cornell Barman 04/15/1935 (81 y.o. Other Clinician: Date of Birth/Sex: Female) Treating [name], [name] Primary Care Hayly Litsey: [name] Betzabe Bevans/Extender: G Referring Danh Bayus: [name] in Treatment: 37 Active Problems Location of Pain Severity and Description of Pain Patient Has Paino Yes Site Locations Pain Location: Generalized Pain With Dressing Change: Yes Pain Management and Medication Current Pain Management: Electronic Signature(s) Signed: 08/07/2016 5:04:45 PM By: Gretta Cool, RN, BSN, Kim RN, BSN Entered By: Gretta Cool, RN, BSN, Kim on 08/07/2016 13:45:35 Brazel, [name] ([id number]) -------------------------------------------------------------------------------- Patient/Caregiver Education Details Patient Name: [name]. Date of Service: 08/07/2016 1:30 PM Medical Record Patient Account Number: [id number] Number: Treating RN: Cornell Barman [birth year] (81 y.o. Other Clinician: Date of Birth/Gender: Female) Treating [name], [name] Primary Care Physician/Extender: Claudette Laws Physician: Suella [place] in Treatment: 37 Referring Physician: [name] Education Assessment Education Provided To: Patient Education Topics Provided Venous: Handouts: Controlling Swelling with Multilayered Compression Wraps Methods: Demonstration Responses: State content correctly Wound/Skin Impairment: Handouts: Caring for Your Ulcer Methods: Demonstration, Explain/Verbal Responses: State content correctly Electronic Signature(s) Signed: 08/07/2016 5:04:45 PM By: Gretta Cool, RN, BSN, Kim RN, BSN Entered By: Gretta Cool, RN, BSN, Kim on 08/07/2016 14:20:34 Engen, [name] ([id number]) -------------------------------------------------------------------------------- Wound Assessment Details Patient Name: Carrie Mulligan C. Date of Service: 08/07/2016 1:30 PM Medical Record Patient Account Number: [id number] Number: Treating RN: Cornell Barman Oct 20, 1934 (81 y.o. Other Clinician: Date of Birth/Sex: Female) Treating [name], [place] Primary Care Shayden Bobier: [name] Eylin Pontarelli/Extender: G Referring Keyandre Pileggi: [name] in Treatment: 37 Wound Status Wound Number: 9 Primary Venous Leg Ulcer Etiology: Wound Location: Left Lower Leg - Posterior Wound Status: Open Wounding Event: Gradually Appeared Comorbid Cataracts, Arrhythmia, Hypotension, Date Acquired: 06/26/2016 History: Osteoarthritis Weeks Of Treatment: 6 Clustered Wound: No Photos Wound Measurements Length: (cm) 1.8 Width: (cm) 0.8 Depth: (cm) 0.1 Area: (cm) 1.131 Volume: (cm) 0.113 % Reduction in Area: 80.8% % Reduction in Volume: 80.8% Epithelialization: None Wound Description Full Thickness Without Exposed Foul Odor After Classification: Support Structures Wound Margin: Flat and Intact Exudate Large Amount: Exudate Type: Serosanguineous Exudate Color: red, brown Cleansing: No Wound Bed Granulation Amount: Small (1-33%) Exposed Structure Granulation Quality: Red Fascia Exposed: No Necrotic Amount: Large (67-100%) Fat Layer (Subcutaneous Tissue) Exposed:  No Necrotic Quality: Adherent Slough Tendon Exposed: No Pruss, Darsi C. ([id number]) Muscle Exposed: No Joint Exposed: No Bone Exposed: No Limited to Skin Breakdown Periwound Skin Texture Texture Color No Abnormalities Noted: No No Abnormalities Noted: No Ecchymosis: Yes Moisture No Abnormalities Noted: No Temperature / Pain Temperature: No Abnormality Tenderness on  Palpation: Yes Wound Preparation Ulcer Cleansing: Rinsed/Irrigated with Saline Topical Anesthetic Applied: Other: lidociane 4%, Treatment Notes Wound #9 (Left, Posterior Lower Leg) 1. Cleansed with: Cleanse wound with antibacterial soap and water 2. Anesthetic Topical Lidocaine 4% cream to wound bed prior to debridement 4. Dressing Applied: Promogran 5. Secondary Dressing Applied ABD Pad Notes kerlix and coban, Foam to protect hematoma at top of wrap. Electronic Signature(s) Signed: 08/07/2016 5:04:45 PM By: Gretta Cool, RN, BSN, Kim RN, BSN Entered By: Gretta Cool, RN, BSN, Kim on 08/07/2016 13:54:05 Vankirk, [name] ([id number]) -------------------------------------------------------------------------------- Vitals Details Patient Name: [name] Date of Service: 08/07/2016 1:30 PM Medical Record Patient Account Number: [id number] Number: Treating RN: Cornell Barman 07-26-1934 (81 y.o. Other Clinician: Date of Birth/Sex: Female) Treating [name], [name] Primary Care Sem Mccaughey: [name] Shaelynn Dragos/Extender: G Referring Maryann Mccall: [name] in Treatment: 37 Vital Signs Time Taken: 13:46 Temperature (F): 98.0 Height (in): 68 Pulse (bpm): 91 Weight (lbs): 178 Respiratory Rate (breaths/min): 16 Body Mass Index (BMI): 27.1 Blood Pressure (mmHg): 132/67 Reference Range: 80 - 120 mg / dl Electronic Signature(s) Signed: 08/07/2016 5:04:45 PM By: Gretta Cool, RN, BSN, Kim RN, BSN Entered By: Gretta Cool, RN, BSN, Kim on 08/07/2016 13:46:35

## 2016-08-08 NOTE — Progress Notes (Signed)
ROSA, [name] [name] ([id number]) Visit Report for 08/07/2016 Chief Complaint Document Details Patient Name: [name], [name]. Date of Service: 08/07/2016 1:30 PM Medical Record Patient Account Number: [id number] Number: Treating RN: Cornell Barman 09-Dec-1934 (81 y.o. Other Clinician: Date of Birth/Sex: Female) Treating [name], [name] Primary Care Provider: [name] Provider/Extender: G Referring Provider: [name] in Treatment: 37 Information Obtained from: Patient Chief Complaint Patient here for reevaluation of her right lower externally ulcer Electronic Signature(s) Signed: 08/07/2016 4:28:51 PM By: [name] MD Entered By: [name] on 08/07/2016 16:22:35 [name] [name], [name] [name] ([id number]) -------------------------------------------------------------------------------- Debridement Details Patient Name: [name] Mulligan C. Date of Service: 08/07/2016 1:30 PM Medical Record Patient Account Number: [id number] Number: Treating RN: Cornell Barman 05-24-1935 (81 y.o. Other Clinician: Date of Birth/Sex: Female) Treating [name], [name] Primary Care Provider: [name] Provider/Extender: G Referring Provider: [name] in Treatment: 37 Debridement Performed for Wound #9 Left,Posterior Lower Leg Assessment: Performed By: Physician [name], MD Debridement: Debridement Pre-procedure Yes - 14:05 Verification/Time Out Taken: Start Time: 14:06 Pain Control: Other : lidocaine 4% Level: Skin/Subcutaneous Tissue Total Area Debrided (L x 1.8 (cm) x 1.8 (cm) = 3.24 (cm) W): Tissue and other Viable, Non-Viable, Fibrin/Slough, Subcutaneous material debrided: Instrument: Curette Bleeding: Minimum Hemostasis Achieved: Pressure End Time: 14:08 Procedural Pain: 0 Post Procedural Pain: 0 Response to Treatment: Procedure was tolerated well Post Debridement Measurements of Total Wound Length: (cm) 1.8 Width: (cm) 1.8 Depth:  (cm) 0.5 Volume: (cm) 1.272 Character of Wound/Ulcer Post Requires Further Debridement Debridement: Severity of Tissue Post Debridement: Fat layer exposed Post Procedure Diagnosis Same as Pre-procedure Electronic Signature(s) Signed: 08/07/2016 4:28:51 PM By: [name] MD Signed: 08/07/2016 5:04:45 PM By: Gretta Cool RN, BSN, Kim RN, BSN [address] [place] ([id number]) Entered By: Gretta Cool, RN, BSN, Kim on 08/07/2016 14:07:53 [name] [name], [name] [name] ([id number]) -------------------------------------------------------------------------------- HPI Details Patient Name: [name] Mulligan C. Date of Service: 08/07/2016 1:30 PM Medical Record Patient Account Number: [id number] Number: Treating RN: Cornell Barman 12-Aug-1934 (81 y.o. Other Clinician: Date of Birth/Sex: Female) Treating [name], [name] Primary Care Provider: [name] Provider/Extender: G Referring Provider: [name] in Treatment: 75 History of Present Illness HPI Description: 11/22/15; this is [name] [name] is a 82 year old woman who lives at home on her own. According to the patient and her daughter was present she has had long-standing edema in her legs dating back many years. She also has a history of chronic systolic heart failure, atrial fibrillation and is status post mitral valve replacement. Last [facility]ogram I see in [facility] showed an ejection fraction of 40-45% she is on Lasix 60 mg a day and spironolactone 25 mg a day. Her current problem began in December around Christmas time she developed a small hematoma in the medial part of her left leg which rapidly expanded to a very large hematoma that required surgical debridement. This situation was complicated by the fact that the patient is on long-standing Coumadin for mechanical heart valve. She went to the OR had this evacuated on January 4 /17. The wound has gradually improved however she has developed a small wounds around this area and more  recently a wound on the right lateral leg. She is weeping edema fluid. The patient is already been to see vascular surgery. It was recommended that she wear Unna boots, she did not tolerate this due to pain in the left leg. She was then prescribed Juzo stockings and really can't get these on herself although truthfully there is probably too much  edema for a Juzo stockings currently. She is not a diabetic and has no history of PAD or claudication that I could elicit. She does not use the external compression pumps reliably. She comes today with notes from her primary physician and Dr. Ronalee Belts both recommending various forms of compression but the patient does not really complied with them. Has been using the external compression pumps with some regularity but certainly not daily on the right leg and this has helped. I also note that her daughter tells me the history that she does not sleep in bed at home. She has a hospital bed but with her legs up she finds this painful so she sleeps in the couch sitting up with her legs dependent. 11/29/15; the patient is arrives today accompanied by her son. He expresses satisfaction that she is maintained the compression all week. 12/06/15; the patient has kept her Profore light wraps on, we have good edema control no major change in the wounds we have been using Aquacel 12/13/15; changed to RTD last week. One of the 3 wounds on her left medial leg is healed she has 2 remaining wounds here and one on the right lateral leg. 12/18/2015 -- the patient was at Dr. Nino Parsley office today and he was seeing her for an arterial study. While the wrap was being removed she had an inadvertent laceration of the left proximal anterior leg which bled quite profusely and a compression dressing was applied over this and the patient was here to get her Profore wraps done. I was asked to see the patient to make an assessment and treat appropriately. 12/26/15; the patient's injury on  the left proximal leg and Steri-Strips removed after soaking. There is an open area here. The original wounds to still open on the right lateral and left medial leg. 01/03/16 patient's injury on her proximal left leg looks quite good. Still small open area on the medial left leg which appears to be improving. The area on the right lateral leg still substantially open with no real improvement in wound depth. Her edema control is marginal with a Profore light. We have been using RTD for 3-4 weeks without any major change 01/10/16: wounds without s/s of infection. vascular results are pending regarding arterial studies. ALIANIS, TRIMMER ([id number]) 01/17/16; patient comes in today complaining of severe pain however I think most of this is in the right hip not related to her wounds. She continues with a oval-shaped wound on the right lateral leg, trauma to the left anterior leg just below her tibial plateau. She has a smaller eschar on the left anterior leg. She is being using Prisma however she informs [place] today that she is actually allergic to silver, nose this from a previous application at [facility] some years ago 01/24/16; edema is not so well controlled today, I think I reduced her to River[place]end bilaterally last week. The area which was a scissor injury on her left upper anterior lower leg is fully epithelialized. She only has a small open area remaining on the medial aspect of her left leg. The oval-shaped wound on the right mid lateral leg may be a bit smaller. Debrided of surface slough nonviable subcutaneous tissue. I had changed to collagen 2 weeks ago in an attempt to get this to close 01/31/16 all the patient's wounds on her left leg give healed. We have good edema control with bilateral Profore lites which she has been compliant with. She still has the oval-shaped wound on the  right lateral calf allergic even this appears to be slowly improving. The patient has juxtalite stockings at home.  She states she thinks she can put these on. She also has external compression pumps at home although I think her compliance with this has not been good in the past. She complains today of edema in her thighs. Tells me she takes 40 mg of Lasix daily 02/21/16; only 1 small wound remains on the lateral aspect of the right calf. She is using Juzo stockings on the left leg although she complains about difficulty in applying them. She has external compression pumps at home 02/28/16; the small open wound on her right lateral calf is improved in terms of wound area. It appears that she has a wrap injury on the anterior aspect of the upper leg 03/06/16; the small open wound on her right lateral calf has a very small open area remaining. The wrap injury on the anterior aspect of the upper leg also appears better. She arrives today in clinic with a history of dyspnea with minimal exertion starting with the last 2-4 days. She does not describe chest pain. Her son and our intake nurse think she has facial swelling. She has a history of an artificial mitral valve on Coumadin 03/13/16; the small wound on the right lateral calf is no better this week. The superior wrap injury anteriorly requires debridement of surface eschar and nonviable subcutaneous tissue. She has been to her primary doctor with regards to her dyspnea we identified last week. Per the patient's description her Lasix has been increased 03/21/2016 -- patient of Dr. Dellia Nims who could not keep her appointment yesterday but has come in today with the right lower leg looking good and this is the leg which has been treated in the recent past. However, her left lower extremity is extremely swollen, tender and edematous with redness and discoloration. 03/27/16; there is only 2 small areas remain on the right leg. The edema that was so concerning last week has come down however there is extensive bruising on the lateral left leg medial left foot suggesting  that she lost some blood into the leg itself. I checked her hemoglobin on 9/21 was 14.5. Her INR was over 6. Duplex ultrasound was negative for DVT 04/03/16 at this point in time today patient is actually doing substantially better in regard to the wound on the anterior right lower extremity. currently there is no slough or eschar noted and no evidence of erythema, discharge, or local infection. She is exhibiting no signs of systemic infection. She is tolerating the dressing changes as well as the wrapping at this point in time. 04/24/16; I have not seen Mrs. Brener for 3 weeks. Apparently she was admitted to hospital for respiratory issues/also apparently has had multiple compression fractures and had kyphoplasty. When she was last here she only had one small open area remaining on the left leg she was using juxtalite stockings. Her son is upset about restarting the Coumadin which she blames or the swelling in her legs. She is on Coumadin for prophylaxis with a chronic artificial heart valve. 04/30/16 at this point in time patient has been tolerating the 3 layer compression wrap as well as the calcium alginate. 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 With tthat being said she does have the Mcaleer, Esto. ([id number]) continued wound of the left lower extremity as well as the right medial lower extremity. The right side is significantly smaller compared to the left but actually  is slough covered. fortunately she has no significant tenderness at rest although with manipulation of the right location of the wound this is significantly tender. 05/07/16 for follow-up evaluation today both the patient's wounds appear to be significantly improved in size compared to last week. She is also having less pain at both locations currently. Her pain level at most is related to be a 1-2 out of 10. She does have some discharge but fortunately no evidence of infection at this point.she also continues  to tolerate the compression wraps well. 05/14/16; the patient's wound on the left leg actually looks as though it's on its way to closure. She is using collagen to this area. She has a small punched out area over the right medial calf. The cause of this is not really clear it has probably 0.4 cm of depth. She has an IV in her right hand which she states is for IV fluid when she develops low sodiums her potassiums, the etiology of this is not clear 05-21-16 she presents today with continued ulcerations the right lower extremity, posterior aspect. she has been using Iodoflex and compression therapy to the area. She denies any new injuries or trauma. She does have 2 areas proximal to this ulceration of dry crust along with an area to her left posterior lower leg that is purple discolored area appears similar to how her current ulceration started, she denies any trauma or injury to the left posterior leg we will monitor all of these areas. 05/28/16; the patient is down to 1 small open area on the right medial mid calf. This is however larger and in 50% of the surface area deeper approximately 0.4 cm. The reason for this deterioration is unclear. I've gone ahead and done a culture of this area she also tells [place] today that she is short of breath. She is also noted a swelling on her lower left eyelid 06/05/16; the patient is 1 small open area on the right medial mid calf. This looks about the same as last week. The deep area, medial 50% of the wound looks about the same. CandS I did last week was negative. The entire area looks about the same 06/12/16; not much change in this over the course of the last week. Patient comes in today complaining of extreme back pain. Apparently she has either a kyphoplasty or vertebral plasty scheduled at [facility] radiology tomorrow. For this reason no debridement today. We have been using Iodoflex 06/19/16; patient arrived with the surface eschar from last week removed  with a curet debrided of subcutaneous tissue. She tolerates this reasonably well. She is still in a skilled facility. States her vertebral plasty last week is made her pain bearable 06/26/16; patient arrived with a new wound on the posterior aspect of her left calf. She notices 3 or 4 days ago but is not certain how this happened. She states she simply felt a stinging sensation and one day on her foot and then the next day on the back of her calf. 06/28/2016 -- Ms. Reubin Milan called urgently saying that the pain in her left foot was drastically worse over the last several hours and though she removed her compression wrap she wanted to be seen before the long holiday. 07/03/16; she came in to see Dr. Con Memos on 12/29 for pain in her left foot/heel. Nothing much was found at the time and she states that the pain is better but involved her whole foot. She has a wound on the lateral  aspect of the left calf and the medial aspect of the right. The medial 1 looks as if it is just about healed however the left one still has considerable edema. 07/09/16; she arrives in clinic today with a dime-sized wound on the left posterior calf and a small linear area on the right medial calf. The area on the left was debrided with a #3 curet nonviable subcutaneous tissue, we are not making progress here. The area on the right was also debrided of surface eschar and nonviable subcutaneous tissue this has some depth to it. 07/16/16; she has a dime size wound on the left posterior calf that has some depth. This required debridement last week, although I did not feel the need to do this today. We used Iodoflex last week. Small area on the right also was not debrided. This looks like it may be close to closing 07/23/16; using Iodoflex to both wound areas. The area on the right posterior/medial calf is just about closed. She still has a fairly substantial wound on the left posterior leg which is still problematic. 07/30/16; the area  on the posterior right leg is closed. The patient will graduate to her own pressure stocking. She has a punched-out area on the left mid calf. The surface is better than last week. I did not attempt Hogrefe, Ledonna C. ([id number]) debridement. Continuing Iodoflex Is complaining about pain in the right lateral ribs this is not pleuritic. She apparently had a chest x-ray done at home the immobile X x-ray. I don't have access to that report. She is not complaining of inspiratory chest pain cough etc. 08/17/16; the posterior right leg wound is closed and she is wearing a compression stocking her son expresses satisfaction with her compliance with the stocking. He continues to have a fairly substantial punched-out circular area on the left posterior calf. This wound only came about in late December, she is not really sure how it happened. Electronic Signature(s) Signed: 08/07/2016 4:28:51 PM By: [name] MD Entered By: [name] on 08/07/2016 16:25:08 [name] [name] ([id number]) -------------------------------------------------------------------------------- Physical Exam Details Patient Name: GENETTA, FIERO C. Date of Service: 08/07/2016 1:30 PM Medical Record Patient Account Number: [id number] Number: Treating RN: Cornell Barman 1934-09-11 (81 y.o. Other Clinician: Date of Birth/Sex: Female) Treating [name], [name] Primary Care Provider: [name] Provider/Extender: G Referring Provider: [name] in Treatment: 37 Constitutional Sitting or standing Blood Pressure is within target range for patient.. Pulse regular and within target range for patient.Marland Kitchen Respirations regular, non-labored and within target range.. Temperature is normal and within the target range for the patient.. Patient's appearance is neat and clean. Appears in no acute distress. Well nourished and well developed.. Notes Wound exam; the area on the left posterior leg is her only  remaining wound area. There is nothing on the right leg or the rest of the left leg. Using a #3 curet I debrided circumferential's callus scan and nonviable subcutaneous tissue from around the surface of the wound also necrotic material from the wound surface. Post debridement this actually looks viable. There is no evidence of surrounding infection Electronic Signature(s) Signed: 08/07/2016 4:28:51 PM By: [name] MD Entered By: [name] on 08/07/2016 16:26:16 Hippler, [name] [name] ([id number]) -------------------------------------------------------------------------------- Physician Orders Details Patient Name: [name] Mulligan C. Date of Service: 08/07/2016 1:30 PM Medical Record Patient Account Number: [id number] Number: Treating RN: Cornell Barman [birth year] (81 y.o. Other Clinician: Date of Birth/Sex: Female) Treating [name], [name] Primary Care Provider: [name] Provider/Extender: Darnell Level  Referring Provider: [name] in Treatment: 37 Verbal / Phone Orders: No Diagnosis Coding ICD-10 Coding Code Description N98.921 Non-pressure chronic ulcer of other part of right lower leg with fat layer exposed Chronic venous hypertension (idiopathic) with ulcer and inflammation of bilateral lower I87.333 extremity I89.0 Lymphedema, not elsewhere classified J94.17 Chronic systolic (congestive) heart failure L97.221 Non-pressure chronic ulcer of left calf limited to breakdown of skin Wound Cleansing Wound #9 Left,Posterior Lower Leg o Clean wound with Normal Saline. o Cleanse wound with mild soap and water o May Shower, gently pat wound dry prior to applying new dressing. Anesthetic Wound #9 Left,Posterior Lower Leg o Topical Lidocaine 4% cream applied to wound bed prior to debridement - for clinic use Primary Wound Dressing Wound #9 Left,Posterior Lower Leg o Promogran Secondary Dressing Wound #9 Left,Posterior Lower Leg o ABD pad o Dry  Gauze Dressing Change Frequency Wound #9 Left,Posterior Lower Leg o Other: - HHRN change it Fridays and pt is has it changed at wound care clinic on Wednesdays. [name] [name], [name] [name] ([id number]) Follow-up Appointments Wound #9 Left,Posterior Lower Leg o Return Appointment in 1 week. Edema Control Wound #9 Left,Posterior Lower Leg o Kerlix and Coban - Bilateral Additional Orders / Instructions Wound #9 Left,Posterior Lower Leg o Increase protein intake. Home Health Wound #9 [place] Visits - [place] Nurse may visit PRN to address patientos wound care needs. o FACE TO FACE ENCOUNTER: MEDICARE and MEDICAID PATIENTS: I certify that this patient is under my care and that I had a face-to-face encounter that meets the physician face-to-face encounter requirements with this patient on this date. The encounter with the patient was in whole or in part for the following MEDICAL CONDITION: (primary reason for [place]) MEDICAL NECESSITY: I certify, that based on my findings, NURSING services are a medically necessary home health service. HOME BOUND STATUS: I certify that my clinical findings support that this patient is homebound (i.e., Due to illness or injury, pt requires aid of supportive devices such as crutches, cane, wheelchairs, walkers, the use of special transportation or the assistance of another person to leave their place of residence. There is a normal inability to leave the home and doing so requires considerable and taxing effort. Other absences are for medical reasons / religious services and are infrequent or of short duration when for other reasons). o If current dressing causes regression in wound condition, may D/C ordered dressing product/s and apply Normal Saline Moist Dressing daily until next [place] / Other MD appointment. [place] of regression in wound  condition at [phone]. o Please direct any NON-WOUND related issues/requests for orders to patient's Primary Care Physician Electronic Signature(s) Signed: 08/07/2016 4:28:51 PM By: [name] MD Signed: 08/07/2016 5:04:45 PM By: Gretta Cool RN, BSN, Kim RN, BSN Entered By: Gretta Cool, RN, BSN, Kim on 08/07/2016 14:08:57 Hedeen, [name] [name] ([id number]) -------------------------------------------------------------------------------- Problem List Details Patient Name: MIHIRA, TOZZI C. Date of Service: 08/07/2016 1:30 PM Medical Record Patient Account Number: [id number] Number: Treating RN: Cornell Barman [birth year] (81 y.o. Other Clinician: Date of Birth/Sex: Female) Treating [name], [name] Primary Care Provider: [name] Provider/Extender: G Referring Provider: [name] in Treatment: 37 Active Problems ICD-10 Encounter Code Description Active Date Diagnosis L97.812 Non-pressure chronic ulcer of other part of right lower leg 04/30/2016 Yes with fat layer exposed I87.333 Chronic venous hypertension (idiopathic) with ulcer and 04/03/2016 Yes inflammation of bilateral lower extremity I89.0 Lymphedema, not  elsewhere classified 04/03/2016 Yes N16.57 Chronic systolic (congestive) heart failure 04/03/2016 Yes L97.221 Non-pressure chronic ulcer of left calf limited to 07/03/2016 Yes breakdown of skin Inactive Problems Resolved Problems ICD-10 Code Description Active Date Resolved Date S81.812S Laceration without foreign body, left lower leg, sequela 04/03/2016 04/03/2016 Electronic Signature(s) Signed: 08/07/2016 4:28:51 PM By: [name] MD Entered By: [name] on 08/07/2016 16:22:17 [place], [name] [name] ([id number]) 98, Hetty Loletha Grayer ([id number]) -------------------------------------------------------------------------------- Progress Note Details Patient Name: [name] Mulligan C. Date of Service: 08/07/2016 1:30 PM Medical Record Patient Account  Number: [id number] Number: Treating RN: Cornell Barman Sep 19, 1934 (81 y.o. Other Clinician: Date of Birth/Sex: Female) Treating [name], [name] Primary Care Provider: [name] Provider/Extender: G Referring Provider: [name] in Treatment: 37 Subjective Chief Complaint Information obtained from Patient Patient here for reevaluation of her right lower externally ulcer History of Present Illness (HPI) 11/22/15; this is Rommel is a 82 year old woman who lives at home on her own. According to the patient and her daughter was present she has had long-standing edema in her legs dating back many years. She also has a history of chronic systolic heart failure, atrial fibrillation and is status post mitral valve replacement. Last echocardiogram I see in [facility] showed an ejection fraction of 40-45% she is on Lasix 60 mg a day and spironolactone 25 mg a day. Her current problem began in December around Christmas time she developed a small hematoma in the medial part of her left leg which rapidly expanded to a very large hematoma that required surgical debridement. This situation was complicated by the fact that the patient is on long-standing Coumadin for mechanical heart valve. She went to the OR had this evacuated on January 4 /17. The wound has gradually improved however she has developed a small wounds around this area and more recently a wound on the right lateral leg. She is weeping edema fluid. The patient is already been to see vascular surgery. It was recommended that she wear Unna boots, she did not tolerate this due to pain in the left leg. She was then prescribed Juzo stockings and really can't get these on herself although truthfully there is probably too much edema for a Juzo stockings currently. She is not a diabetic and has no history of PAD or claudication that I could elicit. She does not use the external compression pumps reliably. She comes today  with notes from her primary physician and Dr. Ronalee Belts both recommending various forms of compression but the patient does not really complied with them. Has been using the external compression pumps with some regularity but certainly not daily on the right leg and this has helped. I also note that her daughter tells me the history that she does not sleep in bed at home. She has a hospital bed but with her legs up she finds this painful so she sleeps in the couch sitting up with her legs dependent. 11/29/15; the patient is arrives today accompanied by her son. He expresses satisfaction that she is maintained the compression all week. 12/06/15; the patient has kept her Profore light wraps on, we have good edema control no major change in the wounds we have been using Aquacel 12/13/15; changed to RTD last week. One of the 3 wounds on her left medial leg is healed she has 2 remaining wounds here and one on the right lateral leg. 12/18/2015 -- the patient was at Dr. Nino Parsley office today and he was seeing her for an arterial study. While  the wrap was being removed she had an inadvertent laceration of the left proximal anterior leg which bled quite profusely and a compression dressing was applied over this and the patient was here to get her Profore wraps done. I was asked to see the patient to make an assessment and treat appropriately. 12/26/15; the patient's injury on the left proximal leg and Steri-Strips removed after soaking. There is an open area here. The original wounds to still open on the right lateral and left medial leg. SHAKENA, CALLARI ([id number]) 01/03/16 patient's injury on her proximal left leg looks quite good. Still small open area on the medial left leg which appears to be improving. The area on the right lateral leg still substantially open with no real improvement in wound depth. Her edema control is marginal with a Profore light. We have been using RTD for 3-4 weeks without any  major change 01/10/16: wounds without s/s of infection. vascular results are pending regarding arterial studies. 01/17/16; patient comes in today complaining of severe pain however I think most of this is in the right hip not related to her wounds. She continues with a oval-shaped wound on the right lateral leg, trauma to the left anterior leg just below her tibial plateau. She has a smaller eschar on the left anterior leg. She is being using Prisma however she informs [place] today that she is actually allergic to silver, nose this from a previous application at [facility] some years ago 01/24/16; edema is not so well controlled today, I think I reduced her to [place] bilaterally last week. The area which was a scissor injury on her left upper anterior lower leg is fully epithelialized. She only has a small open area remaining on the medial aspect of her left leg. The oval-shaped wound on the right mid lateral leg may be a bit smaller. Debrided of surface slough nonviable subcutaneous tissue. I had changed to collagen 2 weeks ago in an attempt to get this to close 01/31/16 all the patient's wounds on her left leg give healed. We have good edema control with bilateral Profore lites which she has been compliant with. She still has the oval-shaped wound on the right lateral calf allergic even this appears to be slowly improving. The patient has juxtalite stockings at home. She states she thinks she can put these on. She also has external compression pumps at home although I think her compliance with this has not been good in the past. She complains today of edema in her thighs. Tells me she takes 40 mg of Lasix daily 02/21/16; only 1 small wound remains on the lateral aspect of the right calf. She is using Juzo stockings on the left leg although she complains about difficulty in applying them. She has external compression pumps at home 02/28/16; the small open wound on her right lateral calf is improved in  terms of wound area. It appears that she has a wrap injury on the anterior aspect of the upper leg 03/06/16; the small open wound on her right lateral calf has a very small open area remaining. The wrap injury on the anterior aspect of the upper leg also appears better. She arrives today in clinic with a history of dyspnea with minimal exertion starting with the last 2-4 days. She does not describe chest pain. Her son and our intake nurse think she has facial swelling. She has a history of an artificial mitral valve on Coumadin 03/13/16; the small wound on the right lateral  calf is no better this week. The superior wrap injury anteriorly requires debridement of surface eschar and nonviable subcutaneous tissue. She has been to her primary doctor with regards to her dyspnea we identified last week. Per the patient's description her Lasix has been increased 03/21/2016 -- patient of Dr. Dellia Nims who could not keep her appointment yesterday but has come in today with the right lower leg looking good and this is the leg which has been treated in the recent past. However, her left lower extremity is extremely swollen, tender and edematous with redness and discoloration. 03/27/16; there is only 2 small areas remain on the right leg. The edema that was so concerning last week has come down however there is extensive bruising on the lateral left leg medial left foot suggesting that she lost some blood into the leg itself. I checked her hemoglobin on 9/21 was 14.5. Her INR was over 6. Duplex ultrasound was negative for DVT 04/03/16 at this point in time today patient is actually doing substantially better in regard to the wound on the anterior right lower extremity. currently there is no slough or eschar noted and no evidence of erythema, discharge, or local infection. She is exhibiting no signs of systemic infection. She is tolerating the dressing changes as well as the wrapping at this point in time. 04/24/16;  I have not seen Mrs. Moritz for 3 weeks. Apparently she was admitted to hospital for respiratory issues/also apparently has had multiple compression fractures and had kyphoplasty. When she was last here she only had one small open area remaining on the left leg she was using juxtalite stockings. Her son [name] [name], [name] [name] ([id number]) is upset about restarting the Coumadin which she blames or the swelling in her legs. She is on Coumadin for prophylaxis with a chronic artificial heart valve. 04/30/16 at this point in time patient has been tolerating the 3 layer compression wrap as well as the calcium alginate. 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 With tthat being said she does have the continued wound of the left lower extremity as well as the right medial lower extremity. The right side is significantly smaller compared to the left but actually is slough covered. fortunately she has no significant tenderness at rest although with manipulation of the right location of the wound this is significantly tender. 05/07/16 for follow-up evaluation today both the patient's wounds appear to be significantly improved in size compared to last week. She is also having less pain at both locations currently. Her pain level at most is related to be a 1-2 out of 10. She does have some discharge but fortunately no evidence of infection at this point.she also continues to tolerate the compression wraps well. 05/14/16; the patient's wound on the left leg actually looks as though it's on its way to closure. She is using collagen to this area. She has a small punched out area over the right medial calf. The cause of this is not really clear it has probably 0.4 cm of depth. She has an IV in her right hand which she states is for IV fluid when she develops low sodiums her potassiums, the etiology of this is not clear 05-21-16 she presents today with continued ulcerations the right lower extremity, posterior  aspect. she has been using Iodoflex and compression therapy to the area. She denies any new injuries or trauma. She does have 2 areas proximal to this ulceration of dry crust along with an area to her left posterior  lower leg that is purple discolored area appears similar to how her current ulceration started, she denies any trauma or injury to the left posterior leg we will monitor all of these areas. 05/28/16; the patient is down to 1 small open area on the right medial mid calf. This is however larger and in 50% of the surface area deeper approximately 0.4 cm. The reason for this deterioration is unclear. I've gone ahead and done a culture of this area she also tells [place] today that she is short of breath. She is also noted a swelling on her lower left eyelid 06/05/16; the patient is 1 small open area on the right medial mid calf. This looks about the same as last week. The deep area, medial 50% of the wound looks about the same. CandS I did last week was negative. The entire area looks about the same 06/12/16; not much change in this over the course of the last week. Patient comes in today complaining of extreme back pain. Apparently she has either a kyphoplasty or vertebral plasty scheduled at [facility] radiology tomorrow. For this reason no debridement today. We have been using Iodoflex 06/19/16; patient arrived with the surface eschar from last week removed with a curet debrided of subcutaneous tissue. She tolerates this reasonably well. She is still in a skilled facility. States her vertebral plasty last week is made her pain bearable 06/26/16; patient arrived with a new wound on the posterior aspect of her left calf. She notices 3 or 4 days ago but is not certain how this happened. She states she simply felt a stinging sensation and one day on her foot and then the next day on the back of her calf. 06/28/2016 -- Ms. Reubin Milan called urgently saying that the pain in her left foot was  drastically worse over the last several hours and though she removed her compression wrap she wanted to be seen before the long holiday. 07/03/16; she came in to see Dr. Con Memos on 12/29 for pain in her left foot/heel. Nothing much was found at the time and she states that the pain is better but involved her whole foot. She has a wound on the lateral aspect of the left calf and the medial aspect of the right. The medial 1 looks as if it is just about healed however the left one still has considerable edema. 07/09/16; she arrives in clinic today with a dime-sized wound on the left posterior calf and a small linear area on the right medial calf. The area on the left was debrided with a #3 curet nonviable subcutaneous tissue, we are not making progress here. The area on the right was also debrided of surface eschar and nonviable subcutaneous tissue this has some depth to it. 07/16/16; she has a dime size wound on the left posterior calf that has some depth. This required debridement last week, although I did not feel the need to do this today. We used Iodoflex last week. Small [name] [name], [name] C. ([id number]) area on the right also was not debrided. This looks like it may be close to closing 07/23/16; using Iodoflex to both wound areas. The area on the right posterior/medial calf is just about closed. She still has a fairly substantial wound on the left posterior leg which is still problematic. 07/30/16; the area on the posterior right leg is closed. The patient will graduate to her own pressure stocking. She has a punched-out area on the left mid calf. The surface is better than  last week. I did not attempt debridement. Continuing Iodoflex Is complaining about pain in the right lateral ribs this is not pleuritic. She apparently had a chest x-ray done at home the immobile X x-ray. I don't have access to that report. She is not complaining of inspiratory chest pain cough etc. 08/17/16; the posterior right  leg wound is closed and she is wearing a compression stocking her son expresses satisfaction with her compliance with the stocking. He continues to have a fairly substantial punched-out circular area on the left posterior calf. This wound only came about in late December, she is not really sure how it happened. Objective Constitutional Sitting or standing Blood Pressure is within target range for patient.. Pulse regular and within target range for patient.Marland Kitchen Respirations regular, non-labored and within target range.. Temperature is normal and within the target range for the patient.. Patient's appearance is neat and clean. Appears in no acute distress. Well nourished and well developed.. Vitals Time Taken: 1:46 PM, Height: 68 in, Weight: 178 lbs, BMI: 27.1, Temperature: 98.0 F, Pulse: 91 bpm, Respiratory Rate: 16 breaths/min, Blood Pressure: 132/67 mmHg. General Notes: Wound exam; the area on the left posterior leg is her only remaining wound area. There is nothing on the right leg or the rest of the left leg. Using a #3 curet I debrided circumferential's callus scan and nonviable subcutaneous tissue from around the surface of the wound also necrotic material from the wound surface. Post debridement this actually looks viable. There is no evidence of surrounding infection Integumentary (Hair, Skin) Wound #9 status is Open. Original cause of wound was Gradually Appeared. The wound is located on the Left,Posterior Lower Leg. The wound measures 1.8cm length x 0.8cm width x 0.1cm depth; 1.131cm^2 area and 0.113cm^3 volume. The wound is limited to skin breakdown. There is a large amount of serosanguineous drainage noted. The wound margin is flat and intact. There is small (1-33%) red granulation within the wound bed. There is a large (67-100%) amount of necrotic tissue within the wound bed including Adherent Slough. The periwound skin appearance exhibited: Ecchymosis. Periwound temperature was  noted as No Abnormality. The periwound has tenderness on palpation. [name] [name], [name] [name] ([id number]) Assessment Active Problems ICD-10 [phone] - Non-pressure chronic ulcer of other part of right lower leg with fat layer exposed I87.333 - Chronic venous hypertension (idiopathic) with ulcer and inflammation of bilateral lower extremity I89.0 - Lymphedema, not elsewhere classified A63.01 - Chronic systolic (congestive) heart failure L97.221 - Non-pressure chronic ulcer of left calf limited to breakdown of skin Procedures Wound #9 Wound #9 is a Venous Leg Ulcer located on the Left,Posterior Lower Leg . There was a Skin/Subcutaneous Tissue Debridement ([id number]) debridement with total area of 3.24 sq cm performed by [name], MD. with the following instrument(s): Curette to remove Viable and Non-Viable tissue/material including Fibrin/Slough and Subcutaneous after achieving pain control using Other (lidocaine 4%). A time out was conducted at 14:05, prior to the start of the procedure. A Minimum amount of bleeding was controlled with Pressure. The procedure was tolerated well with a pain level of 0 throughout and a pain level of 0 following the procedure. Post Debridement Measurements: 1.8cm length x 1.8cm width x 0.5cm depth; 1.272cm^3 volume. Character of Wound/Ulcer Post Debridement requires further debridement. Severity of Tissue Post Debridement is: Fat layer exposed. Post procedure Diagnosis Wound #9: Same as Pre-Procedure Plan Wound Cleansing: Wound #9 Left,Posterior Lower Leg: Clean wound with Normal Saline. Cleanse wound with mild soap and water May Shower,  gently pat wound dry prior to applying new dressing. Anesthetic: Wound #9 Left,Posterior Lower Leg: Topical Lidocaine 4% cream applied to wound bed prior to debridement - for clinic use Primary Wound Dressing: [name] [name], [name] [name] ([id number]) Wound #9 Left,Posterior Lower Leg: Promogran Secondary Dressing: Wound  #9 Left,Posterior Lower Leg: ABD pad Dry Gauze Dressing Change Frequency: Wound #9 Left,Posterior Lower Leg: Other: - HHRN change it Fridays and pt is has it changed at wound care clinic on Wednesdays. Follow-up Appointments: Wound #9 Left,Posterior Lower Leg: Return Appointment in 1 week. Edema Control: Wound #9 Left,Posterior Lower Leg: Kerlix and Coban - Bilateral Additional Orders / Instructions: Wound #9 Left,Posterior Lower Leg: Increase protein intake. Home Health: Wound #9 Left,Posterior Lower Leg: [place] Visits - [place] Nurse may visit PRN to address patient s wound care needs. FACE TO FACE ENCOUNTER: MEDICARE and MEDICAID PATIENTS: I certify that this patient is under my care and that I had a face-to-face encounter that meets the physician face-to-face encounter requirements with this patient on this date. The encounter with the patient was in whole or in part for the following MEDICAL CONDITION: (primary reason for [place]) MEDICAL NECESSITY: I certify, that based on my findings, NURSING services are a medically necessary home health service. HOME BOUND STATUS: I certify that my clinical findings support that this patient is homebound (i.e., Due to illness or injury, pt requires aid of supportive devices such as crutches, cane, wheelchairs, walkers, the use of special transportation or the assistance of another person to leave their place of residence. There is a normal inability to leave the home and doing so requires considerable and taxing effort. Other absences are for medical reasons / religious services and are infrequent or of short duration when for other reasons). If current dressing causes regression in wound condition, may D/C ordered dressing product/s and apply Normal Saline Moist Dressing daily until next [place] / Other MD appointment. [place] of regression in wound condition at  [phone]. Please direct any NON-WOUND related issues/requests for orders to patient's Primary Care Physician kerlix and coban,continue promogran likely need to reaearch what we know about her arterial status Electronic Signature(s) Signed: 08/07/2016 4:28:51 PM By: [name] MD [name] [name], [name] [name] ([id number]) Entered By: [name] on 08/07/2016 16:27:46 [name] [name], [name] [name] ([id number]) -------------------------------------------------------------------------------- SuperBill Details Patient Name: [name] Mulligan C. Date of Service: 08/07/2016 Medical Record Patient Account Number: [id number] Number: Treating RN: Cornell Barman 1934/09/26 (81 y.o. Other Clinician: Date of Birth/Sex: Female) Treating [name], [place] Primary Care Provider: [name] Provider/Extender: G Referring Provider: [name] in Treatment: 37 Diagnosis Coding ICD-10 Codes Code Description [phone] Non-pressure chronic ulcer of other part of right lower leg with fat layer exposed Chronic venous hypertension (idiopathic) with ulcer and inflammation of bilateral lower I87.333 extremity I89.0 Lymphedema, not elsewhere classified D63.87 Chronic systolic (congestive) heart failure L97.221 Non-pressure chronic ulcer of left calf limited to breakdown of skin Facility Procedures CPT4 Code Description: [id number] 11042 - DEB SUBQ TISSUE 20 SQ CM/< ICD-10 Description Diagnosis L97.221 Non-pressure chronic ulcer of left calf limited to Modifier: breakdown of s Quantity: 1 kin Physician Procedures CPT4 Code Description: 1884166 06301 - WC PHYS SUBQ TISS 20 SQ CM ICD-10 Description Diagnosis L97.221 Non-pressure chronic ulcer of left calf limited to Modifier: breakdown of skin Quantity: 1 Electronic Signature(s) Signed: 08/07/2016 4:28:51 PM By: [name] MD Entered By: [name] on 08/07/2016 16:28:12

## 2016-08-13 ENCOUNTER — Encounter: Payer: Medicare Other | Admitting: Internal Medicine

## 2016-08-13 DIAGNOSIS — I87333 Chronic venous hypertension (idiopathic) with ulcer and inflammation of bilateral lower extremity: Secondary | ICD-10-CM | POA: Diagnosis not present

## 2016-08-14 ENCOUNTER — Ambulatory Visit (INDEPENDENT_AMBULATORY_CARE_PROVIDER_SITE_OTHER): Payer: Medicare Other | Admitting: Cardiovascular Disease

## 2016-08-14 ENCOUNTER — Encounter: Payer: Self-pay | Admitting: Cardiovascular Disease

## 2016-08-14 VITALS — BP 114/72 | HR 116 | Ht 68.0 in | Wt 138.2 lb

## 2016-08-14 DIAGNOSIS — M4850XS Collapsed vertebra, not elsewhere classified, site unspecified, sequela of fracture: Secondary | ICD-10-CM

## 2016-08-14 DIAGNOSIS — I483 Typical atrial flutter: Secondary | ICD-10-CM | POA: Diagnosis not present

## 2016-08-14 DIAGNOSIS — Z7189 Other specified counseling: Secondary | ICD-10-CM

## 2016-08-14 DIAGNOSIS — IMO0001 Reserved for inherently not codable concepts without codable children: Secondary | ICD-10-CM

## 2016-08-14 DIAGNOSIS — N183 Chronic kidney disease, stage 3 unspecified: Secondary | ICD-10-CM

## 2016-08-14 DIAGNOSIS — Z952 Presence of prosthetic heart valve: Secondary | ICD-10-CM | POA: Diagnosis not present

## 2016-08-14 DIAGNOSIS — E782 Mixed hyperlipidemia: Secondary | ICD-10-CM

## 2016-08-14 DIAGNOSIS — I890 Lymphedema, not elsewhere classified: Secondary | ICD-10-CM | POA: Diagnosis not present

## 2016-08-14 DIAGNOSIS — R0602 Shortness of breath: Secondary | ICD-10-CM | POA: Diagnosis not present

## 2016-08-14 DIAGNOSIS — I5042 Chronic combined systolic (congestive) and diastolic (congestive) heart failure: Secondary | ICD-10-CM | POA: Diagnosis not present

## 2016-08-14 MED ORDER — AMIODARONE HCL 200 MG PO TABS: 200.0000 mg | ORAL_TABLET | Freq: Two times a day (BID) | ORAL | 3 refills | Status: DC

## 2016-08-14 NOTE — Patient Instructions (Signed)
Medication Instructions:   Please start amiodarone 400 mg twice a day for 5 days (2 of the 200 mg pills Am and PM)  Pulseoximeter to measure heart rate  Monitor heart rate,  When rate drops down (50 to 80) You are probably back in normal rhythm  EKG in 2 weeks  Labwork:  No new labs needed  Testing/Procedures:  No further testing at this time   I recommend watching educational videos on topics of interest to you at:       [email or web address]  Enter code: [email or web address]    Follow-Up: It was a pleasure seeing you in the office today. Please call us if you have new issues that need to be addressed before your next appt.  [phone]  Your physician wants you to follow-up in:  Follow up and EKG in 2 weeks   If you need a refill on your cardiac medications before your next appointment, please call your pharmacy.

## 2016-08-14 NOTE — Progress Notes (Signed)
Cardiology Office Note  Date:  08/14/2016   ID:  [name], DOB March 25, 1935, MRN [id number]  PCP:  Rusty Aus, MD   Chief Complaint  Patient presents with  . OTHER    Afib and CHF c/o sob and edema. Meds reviewed verbally with pt.    HPI:  [name] a pleasant 82 year old woman with history of paroxysmal atrial fibrillation, prior cardioversion April 2017 while on amiodarone, history of  mitral valve replacement with mechanical valve, redo 2016 on chronic anticoagulation, chronic renal insufficiency, long history of vertebral fractures with kyphoplasty, long period of time at [facility] rehabilitation, history of lymphedema who uses lymphedema compression pumps who presents by referral from Dr. Sabra Heck for consultation of her atrial fibrillation  She presents with her son Reports having increasing shortness of breath starting approximately one week ago. This seemed to coincide with the stress of receiving the bill from [facility] rehabilitation This was very traumatic for her.  Since then she has had increasing shortness of breath on exertion such as walking with her walker. She has maintained her lymphedema compression pumps, denies any exacerbation of her leg swelling  Denies any significant chest pain on exertion She previously stopped amiodarone out of concern of hair loss Notes indicating more than 10 pound weight loss over the past 2 months Son reports that she is somewhat depressed  Long discussion concerning her back surgery history [place] over the summer 2017: vertebrae trauma, fell backwards She underwent kyphoplasty twice, in October November 2017 with 6 total kyphoplasty's, ranging L2-L5, S1, T6, T7. She had a very bad experience on her first surgery Pain is somewhat improved  Review of previous records with her shows  hospital admission April 2017 with acute diastolic and systolic CHF, Found to have atrial fibrillation with RVR  Prior workup at [facility]  March 2017 with echocardiogram at that time documenting ejection fraction 25% Right heart catheterization with normal filling pressures  EKG on today's visit documenting wide-complex regular tachycardia 116 bpm concerning for atrial flutter, unable to exclude atrial tachycardia Previous heart rate September 2017 were 57 Heart rate with Dr. Sabra Heck earlier today 118 bpm  Echo 09/2015: - Left ventricle: The cavity size was normal. There was mild concentric hypertrophy. Systolic function was mildly to moderately reduced. The estimated ejection fraction was in the   range of 40% to 45%. Wall motion was normal; there were no regional wall motion abnormalities.  - Mitral valve: A mechanical prosthesis was present. Transvalvular velocity was within the normal range. There was no evidence for  stenosis. Peak gradient (D): 8 mm Hg. - Right ventricle: Systolic function was normal. - Tricuspid valve: There was moderate regurgitation. - Pulmonary arteries: Systolic pressure was mildly elevated. PA peak pressure: 43 mm Hg (S).   PMH:   has a past medical history of Arthritis; Chronic atrial fibrillation ([place]); Chronic back pain; Chronic combined systolic (congestive) and diastolic (congestive) heart failure; Compression fracture; GERD (gastroesophageal reflux disease); Heart murmur; Hypertension; S/P MVR (mitral valve replacement); and Shortness of breath dyspnea.  PSH:    Past Surgical History:  Procedure Laterality Date  . BREAST LUMPECTOMY Right 2005?   benign lump excision  . CARDIAC CATHETERIZATION    . [place], 2016   "MVR; MVR"  . CATARACT EXTRACTION W/PHACO Right 02/20/2015   Procedure: CATARACT EXTRACTION PHACO AND INTRAOCULAR LENS PLACEMENT (IOC);  Surgeon: Estill Cotta, MD;  Location: [facility] ORS;  Service: Ophthalmology;  Laterality: Right;  US01:31.2 AP 25.3%  CXK48.18 Fluid lot # [id number]  . ELECTROPHYSIOLOGIC STUDY N/A 10/09/2015   Procedure: CARDIOVERSION;   Surgeon: Wellington Hampshire, MD;  Location: [facility] ORS;  Service: Cardiovascular;  Laterality: N/A;  . IR GENERIC HISTORICAL  04/01/2016   IR RADIOLOGIST EVAL & MGMT 04/01/2016 [facility]  . IR GENERIC HISTORICAL  04/15/2016   IR VERTEBROPLASTY CERV/THOR BX INC UNI/BIL INC/INJECT/IMAGING 04/15/2016 Luanne Bras, MD [facility]  . IR GENERIC HISTORICAL  04/15/2016   IR VERTEBROPLASTY EA ADDL (T&LS) BX INC UNI/BIL INC INJECT/IMAGING 04/15/2016 Luanne Bras, MD [facility]  . IR GENERIC HISTORICAL  04/15/2016   IR VERTEBROPLASTY EA ADDL (T&LS) BX INC UNI/BIL INC INJECT/IMAGING 04/15/2016 Luanne Bras, MD [facility]  . IR GENERIC HISTORICAL  05/20/2016   IR RADIOLOGIST EVAL & MGMT 05/20/2016 [facility]  . IR GENERIC HISTORICAL  06/13/2016   IR VERTEBROPLASTY EA ADDL (T&LS) BX INC UNI/BIL INC INJECT/IMAGING 06/13/2016 Luanne Bras, MD [facility]  . IR GENERIC HISTORICAL  06/13/2016   IR SACROPLASTY BILATERAL 06/13/2016 Luanne Bras, MD [facility]  . IR GENERIC HISTORICAL  06/13/2016   IR VERTEBROPLASTY LUMBAR BX INC UNI/BIL INC/INJECT/IMAGING 06/13/2016 Luanne Bras, MD [facility]  . IRRIGATION AND DEBRIDEMENT HEMATOMA Left 07/05/2015   Procedure: IRRIGATION AND DEBRIDEMENT HEMATOMA;  Surgeon: Robert Bellow, MD;  Location: [facility] ORS;  Service: General;  Laterality: Left;  . pyloric stenosis  07/1937  . US ECHOCARDIOGRAPHY      Current Outpatient Prescriptions  Medication Sig Dispense Refill  . acetaminophen (TYLENOL) 650 MG CR tablet Take 650 mg by mouth every 8 (eight) hours as needed for pain.    Marland Kitchen aspirin EC 81 MG tablet Take 81 mg by mouth daily.     . calcium carbonate (TUMS - DOSED IN MG ELEMENTAL CALCIUM) 500 MG chewable tablet Chew 1 tablet by mouth every 4 (four) hours as needed for indigestion or heartburn.    . Cholecalciferol (HM VITAMIN D3) 4000 units CAPS Take 1 capsule by mouth daily.    . furosemide (LASIX) 40 MG tablet Take 40 mg  by mouth 2 (two) times daily.     Marland Kitchen KLOR-CON 10 10 MEQ tablet Take 2 capsules by mouth daily.    Marland Kitchen liver oil-zinc oxide (DESITIN) 40 % ointment Apply 1 application topically as needed (bed sores).     . methocarbamol (ROBAXIN) 500 MG tablet Take 250 mg by mouth 4 (four) times daily.    . mirtazapine (REMERON) 15 MG tablet Take 15 mg by mouth at bedtime.    . pantoprazole (PROTONIX) 40 MG tablet Take 40 mg by mouth daily.     . polyethylene glycol (MIRALAX / GLYCOLAX) packet Take 17 g by mouth 2 (two) times daily. 14 each 0  . Probiotic Product (RISA-BID PROBIOTIC PO) Take 1 capsule by mouth 2 (two) times daily.    Marland Kitchen senna-docusate (SENOKOT-S) 8.6-50 MG tablet Take 1 tablet by mouth 2 (two) times daily. (Patient taking differently: Take 2 tablets by mouth 2 (two) times daily. ) 30 tablet 1  . spironolactone (ALDACTONE) 25 MG tablet Take 25 mg by mouth 2 (two) times daily.     Marland Kitchen warfarin (COUMADIN) 5 MG tablet Take 1 tablet (5 mg total) by mouth daily. 30 tablet 2  . amiodarone (PACERONE) 200 MG tablet Take 1 tablet (200 mg total) by mouth 2 (two) times daily. 70 tablet 3   No current facility-administered medications for this visit.      Allergies:   Ace inhibitors; Hydrocodone;  Mercury; Nickel; Silver; Cephalexin; Clindamycin/lincomycin; and Penicillins   Social History:  The patient  reports that she has quit smoking. Her smoking use included Cigarettes. She has a 27.00 pack-year smoking history. She has never used smokeless tobacco. She reports that she drinks alcohol. She reports that she does not use drugs.   Family History:   family history includes Renal Disease in her father; Stroke in her mother.    Review of Systems: Review of Systems  Constitutional: Negative.   Respiratory: Negative.   Cardiovascular: Negative.   Gastrointestinal: Negative.   Musculoskeletal: Negative.   Neurological: Negative.   Psychiatric/Behavioral: Negative.   All other systems reviewed and are  negative.   PHYSICAL EXAM: VS:  BP 114/72 (BP Location: Right Arm, Patient Position: Sitting, Cuff Size: Normal)   Pulse (!) 116   Ht 5\' 8"  (1.727 m)   Wt 138 lb 4 oz (62.7 kg)   BMI 21.02 kg/m  , BMI Body mass index is 21.02 kg/m. GEN: Well nourished, well developed, in no acute distress  HEENT: normal  Neck: no JVD, carotid bruits, or masses Cardiac: RRR; no murmurs, rubs, or gallops,no edema  Respiratory:  clear to auscultation bilaterally, normal work of breathing GI: soft, nontender, nondistended, + BS MS: no deformity or atrophy  Skin: warm and dry, no rash Neuro:  Strength and sensation are intact Psych: euthymic mood, full affect    Recent Labs: 04/13/2016: B Natriuretic Peptide 283.3; TSH 2.239 06/01/2016: ALT 16 07/01/2016: BUN 38; Creatinine, Ser 1.59; Hemoglobin 11.7; Platelets 220; Potassium 5.2; Sodium 137    Lipid Panel No results found for: CHOL, HDL, LDLCALC, TRIG    Wt Readings from Last 3 Encounters:  08/14/16 138 lb 4 oz (62.7 kg)  07/22/16 143 lb 12.8 oz (65.2 kg)  07/08/16 150 lb (68 kg)       ASSESSMENT AND PLAN:  Wide-complex tachycardia Possible atrial flutter as is very regular, unable to exclude atrial tachycardia Worsening symptoms of shortness of breath in the past week possibly corresponding to start of her arrhythmia. Does not appear to be atrial fibrillation as it is very regular. As she is symptomatic, we will try to restore normal sinus rhythm. Heart rate was running 57 in September 2017 INR therapeutic, 2.0, we have recommended she start amiodarone 400 mg twice a day for 5 days then down to 200 mg twice a day Repeat EKG in 2 weeks' time. Could consider cardioversion if she has not restore normal sinus rhythm Potentially would proceed with rate control with beta blockers though blood pressure is low, and she took herself off these medications in the past  Chronic combined systolic and diastolic CHF  Previous ejection fraction one year  ago 40-45% Low ejection fraction possibly exacerbated by arrhythmia/atrial fibrillation at the time She continues to take Lasix twice a day with potassium  Mixed hyperlipidemia Currently not on a statin.   Encounter for anticoagulation discussion and counseling Tolerating warfarin,  We will call Dr. Ammie Ferrier office, let him know we are starting amiodarone. May need INR check in one week  H/O mitral valve replacement with mechanical valve Echocardiogram April 2017 with well-functioning mitral valve  Chronic renal impairment, stage 3 (moderate) Drinking more fluids, creatinine down to 1.5 with BUN 25, best lab work in one year  Lymphedema Steady regiment of lymphedema compression pump with compression hose Ulcer on the left leg, in a wrap  Compression fracture of vertebra, sequela Long discussion concerning her prior vertebral fracture surgeries, reports having  terrible experience on first hospitalization  Shortness of breath  Recent shortness of breath over the past week possibly secondary to underlying arrhythmia/tachycardia Heart rate was running 57 in September 2017    Total encounter time more than 60 minutes  Greater than 50% was spent in counseling and coordination of care with the patient  Disposition:   F/U  2 weeks    Orders Placed This Encounter  Procedures  . EKG 12-Lead     Signed, Esmond Plants, M.D., Ph.D. 08/14/2016  [address], [place]

## 2016-08-14 NOTE — Progress Notes (Addendum)
[name], [name] ([id number]) Visit Report for 08/13/2016 Arrival Information Details Patient Name: [name], [name]. Date of Service: 08/13/2016 3:30 PM Medical Record Patient Account Number: [id number] Number: Treating RN: [name] 1934-11-15 (81 y.o. Other Clinician: Date of Birth/Sex: Female) Treating [name], [name] Primary Care Freeda Spivey: [name] Makaylie Dedeaux/Extender: G Referring Belanna Manring: [name] in Treatment: 63 Visit Information History Since Last Visit All ordered tests and consults were completed: No Patient Arrived: [name] Added or deleted any medications: No Arrival Time: 15:15 Any new allergies or adverse reactions: No Accompanied By: daughter Had a fall or experienced change in No Transfer Assistance: EasyPivot Patient activities of daily living that may affect Lift risk of falls: Patient Identification Verified: Yes Signs or symptoms of abuse/neglect since last No Secondary Verification Process Yes visito Completed: Hospitalized since last visit: No Patient Requires Transmission- No Has Dressing in Place as Prescribed: Yes Based Precautions: Has Compression in Place as Prescribed: Yes Patient Has Alerts: Yes Pain Present Now: No Patient Alerts: Patient on Blood Thinner Inez!!! Electronic Signature(s) Signed: 08/13/2016 4:37:07 PM By: [name] Entered By: [name] on 08/13/2016 15:16:44 Grosvenor, [name] ([id number]) -------------------------------------------------------------------------------- Encounter Discharge Information Details Patient Name: Carrie Mulligan C. Date of Service: 08/13/2016 3:30 PM Medical Record Patient Account Number: [id number] Number: Treating RN: [name] July 24, 1934 (81 y.o. Other Clinician: Date of Birth/Sex: Female) Treating [name], [name] Primary Care Darshay Deupree: [name] Kersti Scavone/Extender: G Referring  Emonni Depasquale: [name] in Treatment: 37 Encounter Discharge Information Items Discharge Pain Level: 0 Discharge Condition: Stable Ambulatory Status: Walker Discharge Destination: Home Transportation: Private Auto Accompanied By: daughter Schedule Follow-up Appointment: Yes Medication Reconciliation completed No and provided to Patient/Care Larkyn Greenberger: Provided on Clinical Summary of Care: 08/13/2016 Form Type Recipient Paper Patient [place] Electronic Signature(s) Signed: 08/13/2016 3:53:04 PM By: Ruthine Dose Entered By: Ruthine Dose on 08/13/2016 15:53:04 Cedano, [name] ([id number]) -------------------------------------------------------------------------------- Lower Extremity Assessment Details Patient Name: Carrie Mulligan C. Date of Service: 08/13/2016 3:30 PM Medical Record Patient Account Number: [id number] Number: Treating RN: [name] Nov 22, 1934 (81 y.o. Other Clinician: Date of Birth/Sex: Female) Treating [name], [name] Primary Care Trinity Hyland: [name] Padraig Nhan/Extender: G Referring Akeisha Lagerquist: [name] in Treatment: 37 Edema Assessment Assessed: [Left: No] [Right: No] E[Left: dema] [Right: :] Calf Left: Right: Point of Measurement: 34 cm From Medial Instep 31.2 cm cm Ankle Left: Right: Point of Measurement: 10 cm From Medial Instep 21 cm cm Vascular Assessment Pulses: Dorsalis Pedis Palpable: [Left:Yes] Posterior Tibial Extremity colors, hair growth, and conditions: Extremity Color: [Left:Hyperpigmented] Temperature of Extremity: [Left:Warm] Capillary Refill: [Left:< 3 seconds] Toe Nail Assessment Left: Right: Thick: No Discolored: No Deformed: No Improper Length and Hygiene: Yes Electronic Signature(s) Signed: 08/13/2016 4:37:07 PM By: [name] Entered By: [name] on 08/13/2016 15:28:20 Hammack, [name] ([id number]) Cockrell, [name]  ([id number]) -------------------------------------------------------------------------------- Multi Wound Chart Details Patient Name: [name], [name] Date of Service: 08/13/2016 3:30 PM Medical Record Patient Account Number: [id number] Number: Treating RN: [name] 08-03-1934 (81 y.o. Other Clinician: Date of Birth/Sex: Female) Treating [name], [name] Primary Care Jaryan Chicoine: [name] Kathelene Rumberger/Extender: G Referring Taiwana Willison: [name] in Treatment: 37 Vital Signs Height(in): 68 Pulse(bpm): 100 Weight(lbs): 178 Blood Pressure 105/69 (mmHg): Body Mass Index(BMI): 27 Temperature(F): 97.7 Respiratory Rate 16 (breaths/min): Photos: [N/A:N/A] Wound Location: Left Lower Leg - Posterior N/A N/A Wounding Event: Gradually Appeared N/A N/A Primary Etiology: Venous Leg Ulcer N/A N/A Comorbid History: Cataracts, Arrhythmia, N/A N/A Hypotension, Osteoarthritis Date Acquired: 06/26/2016  N/A N/A Weeks of Treatment: 6 N/A N/A Wound Status: Open N/A N/A Measurements L x W x D 2x1.3x0.1 N/A N/A (cm) Area (cm) : 2.042 N/A N/A Volume (cm) : 0.204 N/A N/A % Reduction in Area: 65.30% N/A N/A % Reduction in Volume: 65.40% N/A N/A Classification: Full Thickness Without N/A N/A Exposed Support Structures Exudate Amount: Large N/A N/A Exudate Type: Serosanguineous N/A N/A Exudate Color: red, brown N/A N/A Milbourn, Nalany C. ([id number]) Wound Margin: Flat and Intact N/A N/A Granulation Amount: Medium (34-66%) N/A N/A Granulation Quality: Red N/A N/A Necrotic Amount: Medium (34-66%) N/A N/A Exposed Structures: Fascia: No N/A N/A Fat Layer (Subcutaneous Tissue) Exposed: No Tendon: No Muscle: No Joint: No Bone: No Limited to Skin Breakdown Epithelialization: None N/A N/A Debridement: Debridement (12878- N/A N/A 11047) Pre-procedure 15:37 N/A N/A Verification/Time Out Taken: Pain Control: Lidocaine 4% Topical N/A N/A Solution Tissue  Debrided: Fibrin/Slough, Exudates, N/A N/A Subcutaneous Level: Skin/Subcutaneous N/A N/A Tissue Debridement Area (sq 2.6 N/A N/A cm): Instrument: Curette N/A N/A Bleeding: Minimum N/A N/A Hemostasis Achieved: Pressure N/A N/A Procedural Pain: 0 N/A N/A Post Procedural Pain: 0 N/A N/A Debridement Treatment Procedure was tolerated N/A N/A Response: well Post Debridement 2x1.3x0.2 N/A N/A Measurements L x W x D (cm) Post Debridement 0.408 N/A N/A Volume: (cm) Periwound Skin Texture: No Abnormalities Noted N/A N/A Periwound Skin No Abnormalities Noted N/A N/A Moisture: Periwound Skin Color: Ecchymosis: Yes N/A N/A Temperature: No Abnormality N/A N/A Tenderness on Yes N/A N/A Palpation: Wound Preparation: Ulcer Cleansing: N/A N/A Rinsed/Irrigated with Saline [place]side, [name] ([id number]) Topical Anesthetic Applied: Other: lidociane 4% Procedures Performed: Debridement N/A N/A Treatment Notes Wound #9 (Left, Posterior Lower Leg) 1. Cleansed with: Clean wound with Normal Saline Cleanse wound with antibacterial soap and water 2. Anesthetic Topical Lidocaine 4% cream to wound bed prior to debridement 4. Dressing Applied: Promogran 5. Secondary Dressing Applied ABD Pad Notes kerlix and coban, Foam to protect hematoma at top of wrap. Electronic Signature(s) Signed: 08/14/2016 7:47:19 AM By: [name] MD Previous Signature: 08/13/2016 4:37:07 PM Version By: [name] Entered By: [name] on 08/14/2016 03:51:32 Mall, [name] ([id number]) -------------------------------------------------------------------------------- Multi-Disciplinary Care Plan Details Patient Name: DAVEDA, LAROCK. Date of Service: 08/13/2016 3:30 PM Medical Record Patient Account Number: [id number] Number: Treating RN: [name] Aug 11, 1934 (81 y.o. Other Clinician: Date of Birth/Sex: Female) Treating [name], [name] Primary Care Keven Soucy: [name] Charod Slawinski/Extender: G Referring Khris Jansson: [name] in Treatment: 37 Active Inactive ` Orientation to the Wound Care Program Nursing Diagnoses: Knowledge deficit related to the wound healing center program Goals: Patient/caregiver will verbalize understanding of the [place] Date Initiated: 11/22/2015 Target Resolution Date: 09/28/2016 Goal Status: Active Interventions: Provide education on orientation to the wound center Notes: ` Venous Leg Ulcer Nursing Diagnoses: Knowledge deficit related to disease process and management Potential for venous Insuffiency (use before diagnosis confirmed) Goals: Patient will maintain optimal edema control Date Initiated: 11/22/2015 Target Resolution Date: 09/28/2016 Goal Status: Active Patient/caregiver will verbalize understanding of disease process and disease management Date Initiated: 11/22/2015 Target Resolution Date: 09/28/2016 Goal Status: Active Verify adequate tissue perfusion prior to therapeutic compression application Date Initiated: 11/22/2015 Target Resolution Date: 09/28/2016 Goal Status: Active Interventions: TONEY, LIZAOLA ([id number]) Assess peripheral edema status every visit. Compression as ordered Provide education on venous insufficiency Treatment Activities: Non-invasive vascular studies : 11/22/2015 Therapeutic compression applied : 11/22/2015 Notes: ` Wound/Skin Impairment Nursing Diagnoses: Impaired tissue integrity Knowledge deficit related to ulceration/compromised skin integrity  Goals: Patient/caregiver will verbalize understanding of skin care regimen Date Initiated: 11/22/2015 Target Resolution Date: 09/28/2016 Goal Status: Active Ulcer/skin breakdown will have a volume reduction of 30% by week 4 Date Initiated: 11/22/2015 Target Resolution Date: 09/28/2016 Goal Status: Active Ulcer/skin breakdown will have a volume reduction of 50% by week 8 Date Initiated:  11/22/2015 Target Resolution Date: 09/28/2016 Goal Status: Active Ulcer/skin breakdown will have a volume reduction of 80% by week 12 Date Initiated: 11/22/2015 Target Resolution Date: 09/28/2016 Goal Status: Active Ulcer/skin breakdown will heal within 14 weeks Date Initiated: 11/22/2015 Target Resolution Date: 09/28/2016 Goal Status: Active Interventions: Assess patient/caregiver ability to perform ulcer/skin care regimen upon admission and as needed Assess ulceration(s) every visit Provide education on ulcer and skin care Treatment Activities: Skin care regimen initiated : 11/22/2015 Topical wound management initiated : 11/22/2015 Notes: [name], [name] ([id number]) Electronic Signature(s) Signed: 08/13/2016 4:37:07 PM By: [name] Entered By: [name] on 08/13/2016 15:28:55 [name], [name] ([id number]) -------------------------------------------------------------------------------- Pain Assessment Details Patient Name: Carrie Mulligan C. Date of Service: 08/13/2016 3:30 PM Medical Record Patient Account Number: [id number] Number: Treating RN: [name] 10-01-1934 (81 y.o. Other Clinician: Date of Birth/Sex: Female) Treating [name], [name] Primary Care Alexzandra Bilton: [name] Kolter Reaver/Extender: G Referring Eleri Ruben: [name] in Treatment: 37 Active Problems Location of Pain Severity and Description of Pain Patient Has Paino No Site Locations With Dressing Change: No Pain Management and Medication Current Pain Management: Electronic Signature(s) Signed: 08/13/2016 4:37:07 PM By: [name] Entered By: [name] on 08/13/2016 15:16:51 Helf, [name] ([id number]) -------------------------------------------------------------------------------- Patient/Caregiver Education Details Patient Name: Carrie Kussmaul. Date of Service: 08/13/2016 3:30 PM Medical Record Patient Account Number:  [id number] Number: Treating RN: [name] [birth year] (81 y.o. Other Clinician: Date of Birth/Gender: Female) Treating [name], [name] Primary Care Physician/Extender: Claudette Laws Physician: Suella Grove in Treatment: 37 Referring Physician: [name] Education Assessment Education Provided To: Patient Education Topics Provided Wound/Skin Impairment: Handouts: Other: change dressing as ordered Methods: Demonstration, Explain/Verbal Responses: State content correctly Electronic Signature(s) Signed: 08/13/2016 4:37:07 PM By: [name] Entered By: [name] on 08/13/2016 15:32:09 [place], [name] ([id number]) -------------------------------------------------------------------------------- Wound Assessment Details Patient Name: Carrie Mulligan C. Date of Service: 08/13/2016 3:30 PM Medical Record Patient Account Number: [id number] Number: Treating RN: [name] 09-Dec-1934 (81 y.o. Other Clinician: Date of Birth/Sex: Female) Treating [name], [place] Primary Care Kaisa Wofford: [name] Lazara Grieser/Extender: G Referring Shreya Lacasse: [name] in Treatment: 34 Wound Status Wound Number: 9 Primary Venous Leg Ulcer Etiology: Wound Location: Left Lower Leg - Posterior Wound Status: Open Wounding Event: Gradually Appeared Comorbid Cataracts, Arrhythmia, Hypotension, Date Acquired: 06/26/2016 History: Osteoarthritis Weeks Of Treatment: 6 Clustered Wound: No Photos Photo Uploaded By: [name] on 08/13/2016 16:26:24 Wound Measurements Length: (cm) 2 Width: (cm) 1.3 Depth: (cm) 0.1 Area: (cm) 2.042 Volume: (cm) 0.204 % Reduction in Area: 65.3% % Reduction in Volume: 65.4% Epithelialization: None Tunneling: No Undermining: No Wound Description Full Thickness Without Exposed Classification: Support Structures Wound Margin: Flat and Intact Exudate Large Amount: Exudate Type: Serosanguineous Exudate  Color: red, brown Foul Odor After Cleansing: No Wound Bed Granulation Amount: Medium (34-66%) Exposed Structure Prazak, Ambar C. ([id number]) Granulation Quality: Red Fascia Exposed: No Necrotic Amount: Medium (34-66%) Fat Layer (Subcutaneous Tissue) Exposed: No Necrotic Quality: Adherent Slough Tendon Exposed: No Muscle Exposed: No Joint Exposed: No Bone Exposed: No Limited to Skin Breakdown Periwound Skin Texture Texture Color No Abnormalities Noted: No No Abnormalities Noted: No Ecchymosis: Yes Moisture No Abnormalities Noted:  No Temperature / Pain Temperature: No Abnormality Tenderness on Palpation: Yes Wound Preparation Ulcer Cleansing: Rinsed/Irrigated with Saline Topical Anesthetic Applied: Other: lidociane 4%, Treatment Notes Wound #9 (Left, Posterior Lower Leg) 1. Cleansed with: Clean wound with Normal Saline Cleanse wound with antibacterial soap and water 2. Anesthetic Topical Lidocaine 4% cream to wound bed prior to debridement 4. Dressing Applied: Promogran 5. Secondary Dressing Applied ABD Pad Notes kerlix and coban, Foam to protect hematoma at top of wrap. Electronic Signature(s) Signed: 08/13/2016 4:37:07 PM By: [name] Entered By: [name] on 08/13/2016 15:26:10 Shew, [name] ([id number]) -------------------------------------------------------------------------------- Vitals Details Patient Name: Carrie Kussmaul. Date of Service: 08/13/2016 3:30 PM Medical Record Patient Account Number: [id number] Number: Treating RN: [name] 1934-11-06 (81 y.o. Other Clinician: Date of Birth/Sex: Female) Treating [name], [name] Primary Care Stormie Ventola: [name] Ozzie Remmers/Extender: G Referring Geana Walts: [name] in Treatment: 37 Vital Signs Time Taken: 15:16 Temperature (F): 97.7 Height (in): 68 Pulse (bpm): 100 Weight (lbs): 178 Respiratory Rate (breaths/min): 16 Body Mass Index (BMI):  27.1 Blood Pressure (mmHg): 105/69 Reference Range: 80 - 120 mg / dl Electronic Signature(s) Signed: 08/13/2016 4:37:07 PM By: [name] Entered By: [name] on 08/13/2016 15:18:28

## 2016-08-15 ENCOUNTER — Telehealth: Payer: Self-pay | Admitting: Cardiovascular Disease

## 2016-08-15 NOTE — Telephone Encounter (Signed)
Spoke w/ pt's son, Helene Kelp.  He reports that pt's HR has been in the 78-81 range since she left the office yesterday.  Advised that Dr. Rockey Situ recommends she drop her amiodarone from 400 mg BID to 200 mg BID and come in for an EKG. Offered nurse visit tomorrow @ 9:30, but he asks to check her schedule and call back for and EKG next week.

## 2016-08-15 NOTE — Telephone Encounter (Signed)
Pt son called, states pt did have a pulse ox at home and yesterday her HR was 78, and 2nd time was 81. He asks if her medication should be adjusted due to her HR not being elevated as it was yesterday. Please advise.

## 2016-08-17 NOTE — Progress Notes (Signed)
AVIANNAH, CASTORO ([id number]) Visit Report for 08/13/2016 Debridement Details Patient Name: [name], [name]. Date of Service: 08/13/2016 3:30 PM Medical Record Patient Account Number: [id number] Number: Treating RN: [name] 1934/08/18 (81 y.o. Other Clinician: Date of Birth/Sex: Female) Treating [name], [name] Primary Care Provider: [name] Provider/Extender: G Referring Provider: [name] in Treatment: 37 Debridement Performed for Wound #9 Left,Posterior Lower Leg Assessment: Performed By: Physician [name], MD Debridement: Debridement Pre-procedure Yes - 15:37 Verification/Time Out Taken: Start Time: 15:38 Pain Control: Lidocaine 4% Topical Solution Level: Skin/Subcutaneous Tissue Total Area Debrided (L x 2 (cm) x 1.3 (cm) = 2.6 (cm) W): Tissue and other Viable, Non-Viable, Exudate, Fibrin/Slough, Subcutaneous material debrided: Instrument: Curette Bleeding: Minimum Hemostasis Achieved: Pressure End Time: 15:40 Procedural Pain: 0 Post Procedural Pain: 0 Response to Treatment: Procedure was tolerated well Post Debridement Measurements of Total Wound Length: (cm) 2 Width: (cm) 1.3 Depth: (cm) 0.2 Volume: (cm) 0.408 Character of Wound/Ulcer Post Requires Further Debridement Debridement: Severity of Tissue Post Debridement: Fat layer exposed Post Procedure Diagnosis Same as Pre-procedure [name], [name] ([id number]) Electronic Signature(s) Signed: 08/14/2016 7:47:19 AM By: [name] MD Signed: 08/16/2016 4:03:50 PM By: [name] Previous Signature: 08/13/2016 4:37:07 PM Version By: [name] Entered By: [name] on 08/14/2016 03:52:13 Minkoff, [name] ([id number]) -------------------------------------------------------------------------------- HPI Details Patient Name: Carrie Mulligan C. Date of Service: 08/13/2016 3:30 PM Medical Record Patient Account Number:  [id number] Number: Treating RN: [name] 1934/11/05 (81 y.o. Other Clinician: Date of Birth/Sex: Female) Treating [name], [name] Primary Care Provider: [name] Provider/Extender: G Referring Provider: [name] in Treatment: 78 History of Present Illness HPI Description: 11/22/15; this is Pincock is a 82 year old woman who lives at home on her own. According to the patient and her daughter was present she has had long-standing edema in her legs dating back many years. She also has a history of chronic systolic heart failure, atrial fibrillation and is status post mitral valve replacement. [facility] showed an ejection fraction of 40-45% she is on Lasix 60 mg a day and spironolactone 25 mg a day. Her current problem began in December around Christmas time she developed a small hematoma in the medial part of her left leg which rapidly expanded to a very large hematoma that required surgical debridement. This situation was complicated by the fact that the patient is on long-standing Coumadin for mechanical heart valve. She went to the OR had this evacuated on January 4 /17. The wound has gradually improved however she has developed a small wounds around this area and more recently a wound on the right lateral leg. She is weeping edema fluid. The patient is already been to see vascular surgery. It was recommended that she wear Unna boots, she did not tolerate this due to pain in the left leg. She was then prescribed Juzo stockings and really can't get these on herself although truthfully there is probably too much edema for a Juzo stockings currently. She is not a diabetic and has no history of PAD or claudication that I could elicit. She does not use the external compression pumps reliably. She comes today with notes from her primary physician and Dr. Ronalee Belts both recommending various forms of compression but the patient does not  really complied with them. Has been using the external compression pumps with some regularity but certainly not daily on the right leg and this has helped. I also note that her daughter tells  me the history that she does not sleep in bed at home. She has a hospital bed but with her legs up she finds this painful so she sleeps in the couch sitting up with her legs dependent. 11/29/15; the patient is arrives today accompanied by her son. He expresses satisfaction that she is maintained the compression all week. 12/06/15; the patient has kept her Profore light wraps on, we have good edema control no major change in the wounds we have been using Aquacel 12/13/15; changed to RTD last week. One of the 3 wounds on her left medial leg is healed she has 2 remaining wounds here and one on the right lateral leg. 12/18/2015 -- the patient was at Dr. Nino Parsley office today and he was seeing her for an arterial study. While the wrap was being removed she had an inadvertent laceration of the left proximal anterior leg which bled quite profusely and a compression dressing was applied over this and the patient was here to get her Profore wraps done. I was asked to see the patient to make an assessment and treat appropriately. 12/26/15; the patient's injury on the left proximal leg and Steri-Strips removed after soaking. There is an open area here. The original wounds to still open on the right lateral and left medial leg. 01/03/16 patient's injury on her proximal left leg looks quite good. Still small open area on the medial left leg which appears to be improving. The area on the right lateral leg still substantially open with no real improvement in wound depth. Her edema control is marginal with a Profore light. We have been using RTD for 3-4 weeks without any major change 01/10/16: wounds without s/s of infection. vascular results are pending regarding arterial studies. [name], [name] ([id number]) 01/17/16;  patient comes in today complaining of severe pain however I think most of this is in the right hip not related to her wounds. She continues with a oval-shaped wound on the right lateral leg, trauma to the left anterior leg just below her tibial plateau. She has a smaller eschar on the left anterior leg. 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 The area which was a scissor injury on her left upper anterior lower leg is fully epithelialized. She only has a small open area remaining on the medial aspect of her left leg. The oval-shaped wound on the right mid lateral leg may be a bit smaller. Debrided of surface slough nonviable subcutaneous tissue. I had changed to collagen 2 weeks ago in an attempt to get this to close 01/31/16 all the patient's wounds on her left leg give healed. We have good edema control with bilateral Profore lites which she has been compliant with. She still has the oval-shaped wound on the right lateral calf allergic even this appears to be slowly improving. The patient has juxtalite stockings at home. She states she thinks she can put these on. She also has external compression pumps at home although I think her compliance with this has not been good in the past. She complains today of edema in her thighs. Tells me she takes 40 mg of Lasix daily 02/21/16; only 1 small wound remains on the lateral aspect of the right calf. She is using Juzo stockings on the left  leg although she complains about difficulty in applying them. She has external compression pumps at home 02/28/16; the small open wound on her right lateral calf is improved in terms of wound area. It appears that she has a wrap injury on the anterior aspect of the upper leg 03/06/16; the small open  wound on her right lateral calf has a very small open area remaining. The wrap injury on the anterior aspect of the upper leg also appears better. She arrives today in clinic with a history of dyspnea with minimal exertion starting with the last 2-4 days. She does not describe chest pain. Her son and our intake nurse think she has facial swelling. She has a history of an artificial mitral valve on Coumadin 03/13/16; the small wound on the right lateral calf is no better this week. The superior wrap injury anteriorly requires debridement of surface eschar and nonviable subcutaneous tissue. She has been to her primary doctor with regards to her dyspnea we identified last week. Per the patient's description her Lasix has been increased 03/21/2016 -- patient of Dr. Dellia Nims who could not keep her appointment yesterday but has come in today with the right lower leg looking good and this is the leg which has been treated in the recent past. However, her left lower extremity is extremely swollen, tender and edematous with redness and discoloration. 03/27/16; there is only 2 small areas remain on the right leg. The edema that was so concerning last week has come down however there is extensive bruising on the lateral left leg medial left foot suggesting that she lost some blood into the leg itself. I checked her hemoglobin on 9/21 was 14.5. Her INR was over 6. Duplex ultrasound was negative for DVT 04/03/16 at this point in time today patient is actually doing substantially better in regard to the wound on the anterior right lower extremity. currently there is no slough or eschar noted and no evidence of erythema, discharge, or local infection. She is exhibiting no signs of systemic infection. She is tolerating the dressing changes as well as the wrapping at this point in time. 04/24/16; I have not seen Mrs. Audino for 3 weeks. Apparently she was admitted to hospital for respiratory issues/also apparently  has had multiple compression fractures and had kyphoplasty. When she was last here she only had one small open area remaining on the left leg she was using juxtalite stockings. Her son is upset about restarting the Coumadin which she blames or the swelling in her legs. She is on Coumadin for prophylaxis with a chronic artificial heart valve. 04/30/16 at this point in time patient has been tolerating the 3 layer compression wrap as well as the calcium alginate. 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 With tthat being said she does have the [place], [place]. ([id number]) continued wound of the left lower extremity as well as the right medial lower extremity. The right side is significantly smaller compared to the left but actually is slough covered. fortunately she has no significant tenderness at rest although with manipulation of the right location of the wound this is significantly tender. 05/07/16 for follow-up evaluation today both the patient's wounds appear to be significantly improved in size compared to last week. She is also having less pain at both locations currently. Her pain level at most is related to be a 1-2 out of 10. She does have some discharge but fortunately no evidence of infection at this point.she also continues to tolerate the  compression wraps well. 05/14/16; the patient's wound on the left leg actually looks as though it's on its way to closure. She is using collagen to this area. She has a small punched out area over the right medial calf. The cause of this is not really clear it has probably 0.4 cm of depth. She has an IV in her right hand which she states is for IV fluid when she develops low sodiums her potassiums, the etiology of this is not clear 05-21-16 she presents today with continued ulcerations the right lower extremity, posterior aspect. she has been using Iodoflex and compression therapy to the area. She denies any new injuries or trauma. She  does have 2 areas proximal to this ulceration of dry crust along with an area to her left posterior lower leg that is purple discolored area appears similar to how her current ulceration started, she denies any trauma or injury to the left posterior leg we will monitor all of these areas. 05/28/16; the patient is down to 1 small open area on the right medial mid calf. This is however larger and in 50% of the surface area deeper approximately 0.4 cm. The reason for this deterioration is unclear. I've gone ahead and done a culture of this area she also tells [place] today that she is short of breath. She is also noted a swelling on her lower left eyelid 06/05/16; the patient is 1 small open area on the right medial mid calf. This looks about the same as last week. The deep area, medial 50% of the wound looks about the same. CandS I did last week was negative. The entire area looks about the same 06/12/16; not much change in this over the course of the last week. Patient comes in today complaining of extreme back pain. Apparently she has either a kyphoplasty or vertebral plasty scheduled at [facility] radiology tomorrow. For this reason no debridement today. We have been using Iodoflex 06/19/16; patient arrived with the surface eschar from last week removed with a curet debrided of subcutaneous tissue. She tolerates this reasonably well. She is still in a skilled facility. States her vertebral plasty last week is made her pain bearable 06/26/16; patient arrived with a new wound on the posterior aspect of her left calf. She notices 3 or 4 days ago but is not certain how this happened. She states she simply felt a stinging sensation and one day on her foot and then the next day on the back of her calf. 06/28/2016 -- Ms. Reubin Milan called urgently saying that the pain in her left foot was drastically worse over the last several hours and though she removed her compression wrap she wanted to be seen before the  long holiday. 07/03/16; she came in to see Dr. Con Memos on 12/29 for pain in her left foot/heel. Nothing much was found at the time and she states that the pain is better but involved her whole foot. She has a wound on the lateral aspect of the left calf and the medial aspect of the right. The medial 1 looks as if it is just about healed however the left one still has considerable edema. 07/09/16; she arrives in clinic today with a dime-sized wound on the left posterior calf and a small linear area on the right medial calf. The area on the left was debrided with a #3 curet nonviable subcutaneous tissue, we are not making progress here. The area on the right was also debrided of surface eschar and  nonviable subcutaneous tissue this has some depth to it. 07/16/16; she has a dime size wound on the left posterior calf that has some depth. This required debridement last week, although I did not feel the need to do this today. We used Iodoflex last week. Small area on the right also was not debrided. This looks like it may be close to closing 07/23/16; using Iodoflex to both wound areas. The area on the right posterior/medial calf is just about closed. She still has a fairly substantial wound on the left posterior leg which is still problematic. 07/30/16; the area on the posterior right leg is closed. The patient will graduate to her own pressure stocking. She has a punched-out area on the left mid calf. The surface is better than last week. I did not attempt Giampietro, Avea C. ([id number]) debridement. Continuing Iodoflex Is complaining about pain in the right lateral ribs this is not pleuritic. She apparently had a chest x-ray done at home the immobile X x-ray. I don't have access to that report. She is not complaining of inspiratory chest pain cough etc. 08/07/16; the posterior right leg wound is closed and she is wearing a compression stocking her son expresses satisfaction with her compliance with the  stocking. He continues to have a fairly substantial punched-out circular area on the left posterior calf. This wound only came about in late December, she is not really sure how it happened. 08/13/16; no major change; small punched out wound on the left posterior calf. I have reviewed vascular hx. Followed by Dr. Ronalee Belts, not felt to have PAD Electronic Signature(s) Signed: 08/14/2016 7:47:19 AM By: [name] MD Entered By: [name] on 08/14/2016 03:54:50 Weldon, [name] ([id number]) -------------------------------------------------------------------------------- Physical Exam Details Patient Name: Carrie Mulligan C. Date of Service: 08/13/2016 3:30 PM Medical Record Patient Account Number: [id number] Number: Treating RN: [name] [birth year] (81 y.o. Other Clinician: Date of Birth/Sex: Female) Treating [name], [name] Primary Care Provider: [name] Provider/Extender: G Referring Provider: [name] in Treatment: 37 Constitutional Sitting or standing Blood Pressure is within target range for patient.. Pulse regular and within target range for patient.Marland Kitchen Respirations regular, non-labored and within target range.. Temperature is normal and within the target range for the patient.. Patient's appearance is neat and clean. Appears in no acute distress. Well nourished and well developed.. Cardiovascular left is normal. Notes Wound Exam; Area on the left posterior leg. #3 curet surface necrotic material. appears to be improving. no evidence of infection or ischemia Electronic Signature(s) Signed: 08/14/2016 7:47:19 AM By: [name] MD Entered By: [name] on 08/14/2016 03:57:31 Balbuena, [name] ([id number]) -------------------------------------------------------------------------------- Physician Orders Details Patient Name: Carrie Mulligan C. Date of Service: 08/13/2016 3:30 PM Medical Record Patient Account Number:  [id number] Number: Treating RN: [name] December 20, 1934 (81 y.o. Other Clinician: Date of Birth/Sex: Female) Treating [name], [name] Primary Care Provider: [name] Provider/Extender: G Referring Provider: [name] in Treatment: 37 Verbal / Phone Orders: Yes Clinician: Carolyne Fiscal, Debi Read Back and Verified: Yes Diagnosis Coding Wound Cleansing Wound #9 Left,Posterior Lower Leg o Clean wound with Normal Saline. o Cleanse wound with mild soap and water o May Shower, gently pat wound dry prior to applying new dressing. Anesthetic Wound #9 Left,Posterior Lower Leg o Topical Lidocaine 4% cream applied to wound bed prior to debridement - for clinic use Primary Wound Dressing Wound #9 Left,Posterior Lower Leg o Promogran Secondary Dressing Wound #9 Left,Posterior Lower Leg o ABD pad o Dry Gauze Dressing Change  Frequency Wound #9 Left,Posterior Lower Leg o Other: - HHRN change it Fridays and pt is has it changed at wound care clinic on Wednesdays. Follow-up Appointments Wound #9 Left,Posterior Lower Leg o Return Appointment in 1 week. Edema Control Wound #9 Left,Posterior Lower Leg o Kerlix and Coban - Bilateral Additional Orders / Instructions Wound #9 Left,Posterior Lower Leg Austill, Corita C. ([id number]) o Increase protein intake. Home Health Wound #9 [place] Visits - [place] Nurse may visit PRN to address patientos wound care needs. o FACE TO FACE ENCOUNTER: MEDICARE and MEDICAID PATIENTS: I certify that this patient is under my care and that I had a face-to-face encounter that meets the physician face-to-face encounter requirements with this patient on this date. The encounter with the patient was in whole or in part for the following MEDICAL CONDITION: (primary reason for [place]) MEDICAL NECESSITY: I certify, that based on my findings,  NURSING services are a medically necessary home health service. HOME BOUND STATUS: I certify that my clinical findings support that this patient is homebound (i.e., Due to illness or injury, pt requires aid of supportive devices such as crutches, cane, wheelchairs, walkers, the use of special transportation or the assistance of another person to leave their place of residence. There is a normal inability to leave the home and doing so requires considerable and taxing effort. Other absences are for medical reasons / religious services and are infrequent or of short duration when for other reasons). o If current dressing causes regression in wound condition, may D/C ordered dressing product/s and apply Normal Saline Moist Dressing daily until next [place] / Other MD appointment. [place] of regression in wound condition at [phone]. o Please direct any NON-WOUND related issues/requests for orders to patient's Primary Care Physician Electronic Signature(s) Signed: 08/13/2016 4:37:07 PM By: [name] Signed: 08/14/2016 7:47:19 AM By: [name] MD Entered By: [name] on 08/13/2016 15:40:11 [name], [name] ([id number]) -------------------------------------------------------------------------------- Problem List Details Patient Name: MIRHA, BRUCATO C. Date of Service: 08/13/2016 3:30 PM Medical Record Patient Account Number: [id number] Number: Treating RN: [name] 1935/05/27 (81 y.o. Other Clinician: Date of Birth/Sex: Female) Treating [name], [name] Primary Care Provider: [name] Provider/Extender: G Referring Provider: [name] in Treatment: 37 Active Problems ICD-10 Encounter Code Description Active Date Diagnosis L97.812 Non-pressure chronic ulcer of other part of right lower leg 04/30/2016 Yes with fat layer exposed I87.333 Chronic venous hypertension (idiopathic) with ulcer and  04/03/2016 Yes inflammation of bilateral lower extremity I89.0 Lymphedema, not elsewhere classified 04/03/2016 Yes R74.08 Chronic systolic (congestive) heart failure 04/03/2016 Yes L97.221 Non-pressure chronic ulcer of left calf limited to 07/03/2016 Yes breakdown of skin Inactive Problems Resolved Problems ICD-10 Code Description Active Date Resolved Date S81.812S Laceration without foreign body, left lower leg, sequela 04/03/2016 04/03/2016 Electronic Signature(s) Signed: 08/14/2016 7:47:19 AM By: [name] MD Entered By: [name] on 08/14/2016 03:51:00 Yanke, [name] ([id number]) 73, [place] ([id number]) -------------------------------------------------------------------------------- Progress Note Details Patient Name: Carrie Mulligan C. Date of Service: 08/13/2016 3:30 PM Medical Record Patient Account Number: [id number] Number: Treating RN: [name] [birth year] (81 y.o. Other Clinician: Date of Birth/Sex: Female) Treating [name], [name] Primary Care Provider: [name] Provider/Extender: G Referring Provider: [name] in Treatment: 37 Subjective History of Present Illness (HPI) 11/22/15; this is Reddy is a 82 year old woman who lives at home on her own. According to the patient and her daughter was  present she has had long-standing edema in her legs dating back many years. She also has a history of chronic systolic heart failure, atrial fibrillation and is status post mitral valve replacement. Last echocardiogram I see [facility] showed an ejection fraction of 40-45% she is on Lasix 60 mg a day and spironolactone 25 mg a day. Her current problem began in December around Christmas time she developed a small hematoma in the medial part of her left leg which rapidly expanded to a very large hematoma that required surgical debridement. This situation was complicated by the fact that the patient is on long-standing  Coumadin for mechanical heart valve. She went to the OR had this evacuated on January 4 /17. The wound has gradually improved however she has developed a small wounds around this area and more recently a wound on the right lateral leg. She is weeping edema fluid. The patient is already been to see vascular surgery. It was recommended that she wear Unna boots, she did not tolerate this due to pain in the left leg. She was then prescribed Juzo stockings and really can't get these on herself although truthfully there is probably too much edema for a Juzo stockings currently. She is not a diabetic and has no history of PAD or claudication that I could elicit. She does not use the external compression pumps reliably. She comes today with notes from her primary physician and Dr. Ronalee Belts both recommending various forms of compression but the patient does not really complied with them. Has been using the external compression pumps with some regularity but certainly not daily on the right leg and this has helped. I also note that her daughter tells me the history that she does not sleep in bed at home. She has a hospital bed but with her legs up she finds this painful so she sleeps in the couch sitting up with her legs dependent. 11/29/15; the patient is arrives today accompanied by her son. He expresses satisfaction that she is maintained the compression all week. 12/06/15; the patient has kept her Profore light wraps on, we have good edema control no major change in the wounds we have been using Aquacel 12/13/15; changed to RTD last week. One of the 3 wounds on her left medial leg is healed she has 2 remaining wounds here and one on the right lateral leg. 12/18/2015 -- the patient was at Dr. Nino Parsley office today and he was seeing her for an arterial study. While the wrap was being removed she had an inadvertent laceration of the left proximal anterior leg which bled quite profusely and a compression  dressing was applied over this and the patient was here to get her Profore wraps done. I was asked to see the patient to make an assessment and treat appropriately. 12/26/15; the patient's injury on the left proximal leg and Steri-Strips removed after soaking. There is an open area here. The original wounds to still open on the right lateral and left medial leg. 01/03/16 patient's injury on her proximal left leg looks quite good. Still small open area on the medial left leg which appears to be improving. The area on the right lateral leg still substantially open with no real improvement in wound depth. Her edema control is marginal with a Profore light. We have been using RTD for 3-4 weeks without any major change 01/10/16: wounds without s/s of infection. vascular results are pending regarding arterial studies. MECHELLE, PATES ([id number]) 01/17/16; patient comes in today  complaining of severe pain however I think most of this is in the right hip not related to her wounds. She continues with a oval-shaped wound on the right lateral leg, trauma to the left anterior leg just below her tibial plateau. She has a smaller eschar on the left anterior leg. 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 The area which was a scissor injury on her left upper anterior lower leg is fully epithelialized. She only has a small open area remaining on the medial aspect of her left leg. The oval-shaped wound on the right mid lateral leg may be a bit smaller. Debrided of surface slough nonviable subcutaneous tissue. I had changed to collagen 2 weeks ago in an attempt to get this to close 01/31/16 all the patient's wounds on her left leg give healed. We have good edema control with bilateral Profore  lites which she has been compliant with. She still has the oval-shaped wound on the right lateral calf allergic even this appears to be slowly improving. The patient has juxtalite stockings at home. She states she thinks she can put these on. She also has external compression pumps at home although I think her compliance with this has not been good in the past. She complains today of edema in her thighs. Tells me she takes 40 mg of Lasix daily 02/21/16; only 1 small wound remains on the lateral aspect of the right calf. She is using Juzo stockings on the left leg although she complains about difficulty in applying them. She has external compression pumps at home 02/28/16; the small open wound on her right lateral calf is improved in terms of wound area. It appears that she has a wrap injury on the anterior aspect of the upper leg 03/06/16; the small open wound on her right lateral calf has a very small open area remaining. The wrap injury on the anterior aspect of the upper leg also appears better. She arrives today in clinic with a history of dyspnea with minimal exertion starting with the last 2-4 days. She does not describe chest pain. Her son and our intake nurse think she has facial swelling. She has a history of an artificial mitral valve on Coumadin 03/13/16; the small wound on the right lateral calf is no better this week. The superior wrap injury anteriorly requires debridement of surface eschar and nonviable subcutaneous tissue. She has been to her primary doctor with regards to her dyspnea we identified last week. Per the patient's description her Lasix has been increased 03/21/2016 -- patient of Dr. Dellia Nims who could not keep her appointment yesterday but has come in today with the right lower leg looking good and this is the leg which has been treated in the recent past. However, her left lower extremity is extremely swollen, tender and edematous with redness and discoloration. 03/27/16;  there is only 2 small areas remain on the right leg. The edema that was so concerning last week has come down however there is extensive bruising on the lateral left leg medial left foot suggesting that she lost some blood into the leg itself. I checked her hemoglobin on 9/21 was 14.5. Her INR was over 6. Duplex ultrasound was negative for DVT 04/03/16 at this point in time today patient is  actually doing substantially better in regard to the wound on the anterior right lower extremity. currently there is no slough or eschar noted and no evidence of erythema, discharge, or local infection. She is exhibiting no signs of systemic infection. She is tolerating the dressing changes as well as the wrapping at this point in time. 04/24/16; I have not seen Mrs. Hollick for 3 weeks. Apparently she was admitted to hospital for respiratory issues/also apparently has had multiple compression fractures and had kyphoplasty. When she was last here she only had one small open area remaining on the left leg she was using juxtalite stockings. Her son is upset about restarting the Coumadin which she blames or the swelling in her legs. She is on Coumadin for prophylaxis with a chronic artificial heart valve. 04/30/16 at this point in time patient has been tolerating the 3 layer compression wrap as well as the calcium alginate. 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 With tthat being said she does have the [place], [place]. ([id number]) continued wound of the left lower extremity as well as the right medial lower extremity. The right side is significantly smaller compared to the left but actually is slough covered. fortunately she has no significant tenderness at rest although with manipulation of the right location of the wound this is significantly tender. 05/07/16 for follow-up evaluation today both the patient's wounds appear to be significantly improved in size compared to last week. She is also having  less pain at both locations currently. Her pain level at most is related to be a 1-2 out of 10. She does have some discharge but fortunately no evidence of infection at this point.she also continues to tolerate the compression wraps well. 05/14/16; the patient's wound on the left leg actually looks as though it's on its way to closure. She is using collagen to this area. She has a small punched out area over the right medial calf. The cause of this is not really clear it has probably 0.4 cm of depth. She has an IV in her right hand which she states is for IV fluid when she develops low sodiums her potassiums, the etiology of this is not clear 05-21-16 she presents today with continued ulcerations the right lower extremity, posterior aspect. she has been using Iodoflex and compression therapy to the area. She denies any new injuries or trauma. She does have 2 areas proximal to this ulceration of dry crust along with an area to her left posterior lower leg that is purple discolored area appears similar to how her current ulceration started, she denies any trauma or injury to the left posterior leg we will monitor all of these areas. 05/28/16; the patient is down to 1 small open area on the right medial mid calf. This is however larger and in 50% of the surface area deeper approximately 0.4 cm. The reason for this deterioration is unclear. I've gone ahead and done a culture of this area she also tells [place] today that she is short of breath. She is also noted a swelling on her lower left eyelid 06/05/16; the patient is 1 small open area on the right medial mid calf. This looks about the same as last week. The deep area, medial 50% of the wound looks about the same. CandS I did last week was negative. The entire area looks about the same 06/12/16; not much change in this over the course of the last week. Patient comes in today complaining of extreme back pain.  Apparently she has either a kyphoplasty or  vertebral plasty scheduled at [facility] radiology tomorrow. For this reason no debridement today. We have been using Iodoflex 06/19/16; patient arrived with the surface eschar from last week removed with a curet debrided of subcutaneous tissue. She tolerates this reasonably well. She is still in a skilled facility. States her vertebral plasty last week is made her pain bearable 06/26/16; patient arrived with a new wound on the posterior aspect of her left calf. She notices 3 or 4 days ago but is not certain how this happened. She states she simply felt a stinging sensation and one day on her foot and then the next day on the back of her calf. 06/28/2016 -- Ms. Reubin Milan called urgently saying that the pain in her left foot was drastically worse over the last several hours and though she removed her compression wrap she wanted to be seen before the long holiday. 07/03/16; she came in to see Dr. Con Memos on 12/29 for pain in her left foot/heel. Nothing much was found at the time and she states that the pain is better but involved her whole foot. She has a wound on the lateral aspect of the left calf and the medial aspect of the right. The medial 1 looks as if it is just about healed however the left one still has considerable edema. 07/09/16; she arrives in clinic today with a dime-sized wound on the left posterior calf and a small linear area on the right medial calf. The area on the left was debrided with a #3 curet nonviable subcutaneous tissue, we are not making progress here. The area on the right was also debrided of surface eschar and nonviable subcutaneous tissue this has some depth to it. 07/16/16; she has a dime size wound on the left posterior calf that has some depth. This required debridement last week, although I did not feel the need to do this today. We used Iodoflex last week. Small area on the right also was not debrided. This looks like it may be close to closing 07/23/16; using Iodoflex  to both wound areas. The area on the right posterior/medial calf is just about closed. She still has a fairly substantial wound on the left posterior leg which is still problematic. 07/30/16; the area on the posterior right leg is closed. The patient will graduate to her own pressure stocking. She has a punched-out area on the left mid calf. The surface is better than last week. I did not attempt [name], [name] ([id number]) debridement. Continuing Iodoflex Is complaining about pain in the right lateral ribs this is not pleuritic. She apparently had a chest x-ray done at home the immobile X x-ray. I don't have access to that report. She is not complaining of inspiratory chest pain cough etc. 08/07/16; the posterior right leg wound is closed and she is wearing a compression stocking her son expresses satisfaction with her compliance with the stocking. He continues to have a fairly substantial punched-out circular area on the left posterior calf. This wound only came about in late December, she is not really sure how it happened. 08/13/16; no major change; small punched out wound on the left posterior calf. I have reviewed vascular hx. Followed by Dr. Ronalee Belts, not felt to have PAD Objective Constitutional Sitting or standing Blood Pressure is within target range for patient.. Pulse regular and within target range for patient.Marland Kitchen Respirations regular, non-labored and within target range.. Temperature is normal and within the target range for  the patient.. Patient's appearance is neat and clean. Appears in no acute distress. Well nourished and well developed.. Vitals Time Taken: 3:16 PM, Height: 68 in, Weight: 178 lbs, BMI: 27.1, Temperature: 97.7 F, Pulse: 100 bpm, Respiratory Rate: 16 breaths/min, Blood Pressure: 105/69 mmHg. Cardiovascular left is normal. General Notes: Wound Exam; Area on the left posterior leg. #3 curet surface necrotic material. appears to be improving. no evidence of  infection or ischemia Integumentary (Hair, Skin) Wound #9 status is Open. Original cause of wound was Gradually Appeared. The wound is located on the Left,Posterior Lower Leg. The wound measures 2cm length x 1.3cm width x 0.1cm depth; 2.042cm^2 area and 0.204cm^3 volume. The wound is limited to skin breakdown. There is no tunneling or undermining noted. There is a large amount of serosanguineous drainage noted. The wound margin is flat and intact. There is medium (34-66%) red granulation within the wound bed. There is a medium (34-66%) amount of necrotic tissue within the wound bed including Adherent Slough. The periwound skin appearance exhibited: Ecchymosis. Periwound temperature was noted as No Abnormality. The periwound has tenderness on palpation. [name], [name] ([id number]) Assessment Active Problems ICD-10 [id number] - Non-pressure chronic ulcer of other part of right lower leg with fat layer exposed I87.333 - Chronic venous hypertension (idiopathic) with ulcer and inflammation of bilateral lower extremity I89.0 - Lymphedema, not elsewhere classified Q22.29 - Chronic systolic (congestive) heart failure L97.221 - Non-pressure chronic ulcer of left calf limited to breakdown of skin Procedures Wound #9 Wound #9 is a Venous Leg Ulcer located on the Left,Posterior Lower Leg . There was a Skin/Subcutaneous Tissue Debridement ([id number]) debridement with total area of 2.6 sq cm performed by [name], MD. with the following instrument(s): Curette to remove Viable and Non-Viable tissue/material including Exudate, Fibrin/Slough, and Subcutaneous after achieving pain control using Lidocaine 4% Topical Solution. A time out was conducted at 15:37, prior to the start of the procedure. A Minimum amount of bleeding was controlled with Pressure. The procedure was tolerated well with a pain level of 0 throughout and a pain level of 0 following the procedure. Post Debridement  Measurements: 2cm length x 1.3cm width x 0.2cm depth; 0.408cm^3 volume. Character of Wound/Ulcer Post Debridement requires further debridement. Severity of Tissue Post Debridement is: Fat layer exposed. Post procedure Diagnosis Wound #9: Same as Pre-Procedure Plan Wound Cleansing: Wound #9 Left,Posterior Lower Leg: Clean wound with Normal Saline. Cleanse wound with mild soap and water May Shower, gently pat wound dry prior to applying new dressing. Anesthetic: Wound #9 Left,Posterior Lower Leg: Topical Lidocaine 4% cream applied to wound bed prior to debridement - for clinic use Primary Wound Dressing: Wound #9 Left,Posterior Lower Leg: Promogran Secondary Dressing: [name], [name] ([id number]) Wound #9 Left,Posterior Lower Leg: ABD pad Dry Gauze Dressing Change Frequency: Wound #9 Left,Posterior Lower Leg: Other: - HHRN change it Fridays and pt is has it changed at wound care clinic on Wednesdays. Follow-up Appointments: Wound #9 Left,Posterior Lower Leg: Return Appointment in 1 week. Edema Control: Wound #9 Left,Posterior Lower Leg: Kerlix and Coban - Bilateral Additional Orders / Instructions: Wound #9 Left,Posterior Lower Leg: Increase protein intake. Home Health: Wound #9 Left,Posterior Lower Leg: [place] Visits - [place] Nurse may visit PRN to address patient s wound care needs. FACE TO FACE ENCOUNTER: MEDICARE and MEDICAID PATIENTS: I certify that this patient is under my care and that I had a face-to-face encounter that meets the physician face-to-face encounter requirements with this patient  on this date. The encounter with the patient was in whole or in part for the following MEDICAL CONDITION: (primary reason for [place]) MEDICAL NECESSITY: I certify, that based on my findings, NURSING services are a medically necessary home health service. HOME BOUND STATUS: I certify that my clinical findings support that this  patient is homebound (i.e., Due to illness or injury, pt requires aid of supportive devices such as crutches, cane, wheelchairs, walkers, the use of special transportation or the assistance of another person to leave their place of residence. There is a normal inability to leave the home and doing so requires considerable and taxing effort. Other absences are for medical reasons / religious services and are infrequent or of short duration when for other reasons). If current dressing causes regression in wound condition, may D/C ordered dressing product/s and apply Normal Saline Moist Dressing daily until next [place] / Other MD appointment. [place] of regression in wound condition at [phone]. Please direct any NON-WOUND related issues/requests for orders to patient's Primary Care Physician continue promogran as the primary dressing. kerlix and coban, changed by homehealth Electronic Signature(s) Signed: 08/14/2016 7:47:19 AM By: [name] MD Entered By: [name] on 08/14/2016 04:00:51 Bene, [name] ([id number]) -------------------------------------------------------------------------------- SuperBill Details Patient Name: Carrie Mulligan C. Date of Service: 08/13/2016 Medical Record Patient Account Number: [id number] Number: Treating RN: [name] Feb 06, 1935 (81 y.o. Other Clinician: Date of Birth/Sex: Female) Treating [name], [name] Primary Care Provider: [name] Provider/Extender: G Referring Provider: [name] Service Line: Outpatient Weeks in Treatment: 37 Diagnosis Coding ICD-10 Codes Code Description [phone] Non-pressure chronic ulcer of other part of right lower leg with fat layer exposed Chronic venous hypertension (idiopathic) with ulcer and inflammation of bilateral lower I87.333 extremity I89.0 Lymphedema, not elsewhere classified O84.16 Chronic systolic (congestive) heart  failure L97.221 Non-pressure chronic ulcer of left calf limited to breakdown of skin Facility Procedures CPT4 Code Description: [id number] 11042 - DEB SUBQ TISSUE 20 SQ CM/< ICD-10 Description Diagnosis L97.221 Non-pressure chronic ulcer of left calf limited to Modifier: breakdown of sk Quantity: 1 in Physician Procedures CPT4 Code Description: [id number] - WC PHYS SUBQ TISS 20 SQ CM ICD-10 Description Diagnosis L97.221 Non-pressure chronic ulcer of left calf limited to Modifier: breakdown of ski Quantity: 1 n Electronic Signature(s) Signed: 08/14/2016 7:47:19 AM By: [name] MD Entered By: [name] on 08/14/2016 04:01:20

## 2016-08-21 ENCOUNTER — Encounter: Payer: Medicare Other | Admitting: Internal Medicine

## 2016-08-21 DIAGNOSIS — I87333 Chronic venous hypertension (idiopathic) with ulcer and inflammation of bilateral lower extremity: Secondary | ICD-10-CM | POA: Diagnosis not present

## 2016-08-22 NOTE — Progress Notes (Addendum)
Carrie, [name] ([id number]) Visit Report for 08/21/2016 Chief Complaint Document Details Patient Name: [name], [name]. Date of Service: 08/21/2016 1:30 PM Medical Record Patient Account Number: [id number] Number: Treating RN: [name] 1935-04-08 (82 y.o. Other Clinician: Date of Birth/Sex: Female) Treating Zayne Draheim Primary Care Provider: [name] Provider/Extender: G Referring Provider: [name] in Treatment: 39 Information Obtained from: Patient Chief Complaint Patient here for reevaluation of her right lower externally ulcer Electronic Signature(s) Signed: 08/21/2016 4:59:22 PM By: [name] MD Entered By: [name] on 08/21/2016 14:39:23 Tignor, [name] ([id number]) -------------------------------------------------------------------------------- Debridement Details Patient Name: Carrie Mulligan C. Date of Service: 08/21/2016 1:30 PM Medical Record Patient Account Number: [id number] Number: Treating RN: [name] 1934/07/17 (82 y.o. Other Clinician: Date of Birth/Sex: Female) Treating Pavneet Markwood Primary Care Provider: [name] Provider/Extender: G Referring Provider: [name] in Treatment: 39 Debridement Performed for Wound #9 Left,Posterior Lower Leg Assessment: Performed By: Physician [name], MD Debridement: Debridement Pre-procedure Yes - 14:10 Verification/Time Out Taken: Start Time: 14:11 Pain Control: Lidocaine 4% Topical Solution Level: Skin/Subcutaneous Tissue Total Area Debrided (L x 1.4 (cm) x 1 (cm) = 1.4 (cm) W): Tissue and other Viable, Non-Viable, Exudate, Fibrin/Slough, Subcutaneous material debrided: Instrument: Curette Bleeding: Minimum Hemostasis Achieved: Pressure End Time: 14:13 Procedural Pain: 0 Post Procedural Pain: 0 Response to Treatment: Procedure was tolerated well Post Debridement Measurements of Total Wound Length: (cm)  1.4 Width: (cm) 1 Depth: (cm) 0.2 Volume: (cm) 0.22 Character of Wound/Ulcer Post Requires Further Debridement Debridement: Severity of Tissue Post Debridement: Fat layer exposed Post Procedure Diagnosis Same as Pre-procedure Electronic Signature(s) Signed: 08/21/2016 4:59:22 PM By: [name] MD Signed: 08/21/2016 5:13:02 PM By: Janith Lima ([id number]) Entered By: [name] on 08/21/2016 14:39:05 Ranganathan, [name] ([id number]) -------------------------------------------------------------------------------- HPI Details Patient Name: Carrie Mulligan C. Date of Service: 08/21/2016 1:30 PM Medical Record Patient Account Number: [id number] Number: Treating RN: [name] 02-16-1935 (82 y.o. Other Clinician: Date of Birth/Sex: Female) Treating Lauryl Seyer Primary Care Provider: [name] Provider/Extender: G Referring Provider: [name] in Treatment: 39 History of Present Illness HPI Description: 11/22/15; this is [name] is a 82 year old woman who lives at home on her own. According to the patient and her daughter was present she has had long-standing edema in her legs dating back many years. She also has a history of chronic systolic heart failure, atrial fibrillation and is status post mitral valve replacement. [facility] showed an ejection fraction of 40-45% she is on Lasix 60 mg a day and spironolactone 25 mg a day. Her current problem began in December around Christmas time she developed a small hematoma in the medial part of her left leg which rapidly expanded to a very large hematoma that required surgical debridement. This situation was complicated by the fact that the patient is on long-standing Coumadin for mechanical heart valve. She went to the OR had this evacuated on January 4 /17. The wound has gradually improved however she has developed a small wounds around  this area and more recently a wound on the right lateral leg. She is weeping edema fluid. The patient is already been to see vascular surgery. It was recommended that she wear Unna boots, she did not tolerate this due to pain in the left leg. She was then prescribed Juzo stockings and really can't get these on herself although truthfully there is probably too much edema for a Juzo stockings  currently. She is not a diabetic and has no history of PAD or claudication that I could elicit. She does not use the external compression pumps reliably. She comes today with notes from her primary physician and Dr. Ronalee Belts both recommending various forms of compression but the patient does not really complied with them. Has been using the external compression pumps with some regularity but certainly not daily on the right leg and this has helped. I also note that her daughter tells me the history that she does not sleep in bed at home. She has a hospital bed but with her legs up she finds this painful so she sleeps in the couch sitting up with her legs dependent. 11/29/15; the patient is arrives today accompanied by her son. He expresses satisfaction that she is maintained the compression all week. 12/06/15; the patient has kept her Profore light wraps on, we have good edema control no major change in the wounds we have been using Aquacel 12/13/15; changed to RTD last week. One of the 3 wounds on her left medial leg is healed she has 2 remaining wounds here and one on the right lateral leg. 12/18/2015 -- the patient was at Dr. Nino Parsley office today and he was seeing her for an arterial study. While the wrap was being removed she had an inadvertent laceration of the left proximal anterior leg which bled quite profusely and a compression dressing was applied over this and the patient was here to get her Profore wraps done. I was asked to see the patient to make an assessment and treat appropriately. 12/26/15; the  patient's injury on the left proximal leg and Steri-Strips removed after soaking. There is an open area here. The original wounds to still open on the right lateral and left medial leg. 01/03/16 patient's injury on her proximal left leg looks quite good. Still small open area on the medial left leg which appears to be improving. The area on the right lateral leg still substantially open with no real improvement in wound depth. Her edema control is marginal with a Profore light. We have been using RTD for 3-4 weeks without any major change 01/10/16: wounds without s/s of infection. vascular results are pending regarding arterial studies. BEATRIC, FULOP ([id number]) 01/17/16; patient comes in today complaining of severe pain however I think most of this is in the right hip not related to her wounds. She continues with a oval-shaped wound on the right lateral leg, trauma to the left anterior leg just below her tibial plateau. She has a smaller eschar on the left anterior leg. 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 The area which was a scissor injury on her left upper anterior lower leg is fully epithelialized. She only has a small open area remaining on the medial aspect of her left leg. The oval-shaped wound on the right mid lateral leg may be a bit smaller. Debrided of surface slough nonviable subcutaneous tissue. I had changed to collagen 2 weeks ago in an attempt to get this to close 01/31/16 all the patient's wounds on her left leg give healed. We have good edema control with bilateral Profore lites which she has been compliant with. She still has the oval-shaped wound on the right lateral calf allergic even  this appears to be slowly improving. The patient has juxtalite  stockings at home. She states she thinks she can put these on. She also has external compression pumps at home although I think her compliance with this has not been good in the past. She complains today of edema in her thighs. Tells me she takes 40 mg of Lasix daily 02/21/16; only 1 small wound remains on the lateral aspect of the right calf. She is using Juzo stockings on the left leg although she complains about difficulty in applying them. She has external compression pumps at home 02/28/16; the small open wound on her right lateral calf is improved in terms of wound area. It appears that she has a wrap injury on the anterior aspect of the upper leg 03/06/16; the small open wound on her right lateral calf has a very small open area remaining. The wrap injury on the anterior aspect of the upper leg also appears better. She arrives today in clinic with a history of dyspnea with minimal exertion starting with the last 2-4 days. She does not describe chest pain. Her son and our intake nurse think she has facial swelling. She has a history of an artificial mitral valve on Coumadin 03/13/16; the small wound on the right lateral calf is no better this week. The superior wrap injury anteriorly requires debridement of surface eschar and nonviable subcutaneous tissue. She has been to her primary doctor with regards to her dyspnea we identified last week. Per the patient's description her Lasix has been increased 03/21/2016 -- patient of Dr. Dellia Nims who could not keep her appointment yesterday but has come in today with the right lower leg looking good and this is the leg which has been treated in the recent past. However, her left lower extremity is extremely swollen, tender and edematous with redness and discoloration. 03/27/16; there is only 2 small areas remain on the right leg. The edema that was so concerning last week has come down however there is extensive bruising on the lateral left leg medial  left foot suggesting that she lost some blood into the leg itself. I checked her hemoglobin on 9/21 was 14.5. Her INR was over 6. Duplex ultrasound was negative for DVT 04/03/16 at this point in time today patient is actually doing substantially better in regard to the wound on the anterior right lower extremity. currently there is no slough or eschar noted and no evidence of erythema, discharge, or local infection. She is exhibiting no signs of systemic infection. She is tolerating the dressing changes as well as the wrapping at this point in time. 04/24/16; I have not seen Mrs. Raimer for 3 weeks. Apparently she was admitted to hospital for respiratory issues/also apparently has had multiple compression fractures and had kyphoplasty. When she was last here she only had one small open area remaining on the left leg she was using juxtalite stockings. Her son is upset about restarting the Coumadin which she blames or the swelling in her legs. She is on Coumadin for prophylaxis with a chronic artificial heart valve. 04/30/16 at this point in time patient has been tolerating the 3 layer compression wrap as well as the calcium alginate. 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 With tthat being said she does have the Meriwether, Cartwright. ([id number]) continued wound of the left lower extremity as well as the right medial lower extremity. The right side is significantly smaller compared to the left but actually is slough covered. fortunately she  has no significant tenderness at rest although with manipulation of the right location of the wound this is significantly tender. 05/07/16 for follow-up evaluation today both the patient's wounds appear to be significantly improved in size compared to last week. She is also having less pain at both locations currently. Her pain level at most is related to be a 1-2 out of 10. She does have some discharge but fortunately no evidence of infection at  this point.she also continues to tolerate the compression wraps well. 05/14/16; the patient's wound on the left leg actually looks as though it's on its way to closure. She is using collagen to this area. She has a small punched out area over the right medial calf. The cause of this is not really clear it has probably 0.4 cm of depth. She has an IV in her right hand which she states is for IV fluid when she develops low sodiums her potassiums, the etiology of this is not clear 05-21-16 she presents today with continued ulcerations the right lower extremity, posterior aspect. she has been using Iodoflex and compression therapy to the area. She denies any new injuries or trauma. She does have 2 areas proximal to this ulceration of dry crust along with an area to her left posterior lower leg that is purple discolored area appears similar to how her current ulceration started, she denies any trauma or injury to the left posterior leg we will monitor all of these areas. 05/28/16; the patient is down to 1 small open area on the right medial mid calf. This is however larger and in 50% of the surface area deeper approximately 0.4 cm. The reason for this deterioration is unclear. I've gone ahead and done a culture of this area she also tells [place] today that she is short of breath. She is also noted a swelling on her lower left eyelid 06/05/16; the patient is 1 small open area on the right medial mid calf. This looks about the same as last week. The deep area, medial 50% of the wound looks about the same. CandS I did last week was negative. The entire area looks about the same 06/12/16; not much change in this over the course of the last week. Patient comes in today complaining of extreme back pain. Apparently she has either a kyphoplasty or vertebral plasty scheduled at [facility] radiology tomorrow. For this reason no debridement today. We have been using Iodoflex 06/19/16; patient arrived with the surface  eschar from last week removed with a curet debrided of subcutaneous tissue. She tolerates this reasonably well. She is still in a skilled facility. States her vertebral plasty last week is made her pain bearable 06/26/16; patient arrived with a new wound on the posterior aspect of her left calf. She notices 3 or 4 days ago but is not certain how this happened. She states she simply felt a stinging sensation and one day on her foot and then the next day on the back of her calf. 06/28/2016 -- Ms. Reubin Milan called urgently saying that the pain in her left foot was drastically worse over the last several hours and though she removed her compression wrap she wanted to be seen before the long holiday. 07/03/16; she came in to see Dr. Con Memos on 12/29 for pain in her left foot/heel. Nothing much was found at the time and she states that the pain is better but involved her whole foot. She has a wound on the lateral aspect of the left calf  and the medial aspect of the right. The medial 1 looks as if it is just about healed however the left one still has considerable edema. 07/09/16; she arrives in clinic today with a dime-sized wound on the left posterior calf and a small linear area on the right medial calf. The area on the left was debrided with a #3 curet nonviable subcutaneous tissue, we are not making progress here. The area on the right was also debrided of surface eschar and nonviable subcutaneous tissue this has some depth to it. 07/16/16; she has a dime size wound on the left posterior calf that has some depth. This required debridement last week, although I did not feel the need to do this today. We used Iodoflex last week. Small area on the right also was not debrided. This looks like it may be close to closing 07/23/16; using Iodoflex to both wound areas. The area on the right posterior/medial calf is just about closed. She still has a fairly substantial wound on the left posterior leg which is still  problematic. 07/30/16; the area on the posterior right leg is closed. The patient will graduate to her own pressure stocking. She has a punched-out area on the left mid calf. The surface is better than last week. I did not attempt Demarcus, Octavie C. ([id number]) debridement. Continuing Iodoflex Is complaining about pain in the right lateral ribs this is not pleuritic. She apparently had a chest x-ray done at home the immobile X x-ray. I don't have access to that report. She is not complaining of inspiratory chest pain cough etc. 08/07/16; the posterior right leg wound is closed and she is wearing a compression stocking her son expresses satisfaction with her compliance with the stocking. He continues to have a fairly substantial punched-out circular area on the left posterior calf. This wound only came about in late December, she is not really sure how it happened. 08/13/16; no major change; small punched out wound on the left posterior calf. I have reviewed vascular hx. Followed by Dr. Ronalee Belts, not felt to have PAD 08/21/16; the area on her left posterior calf still a small punched out wound. Debrided this with a #3 curet of surface necrotic material however the wound continues to clean up quite nicely and I think the dimensions overall her smaller. We have been using Promogran. UNFORTUNATELY the patient arrives today with a fairly substantial skin tear on the right anterior lateral leg that was sustained when an aide was helping her put on her compression stockings. This happened 3 days ago. We will continue Promogran to all wound areas now including the right leg again. She is asked to keep the wraps that we put on in place and we will discharge home health today at her request Electronic Signature(s) Signed: 08/21/2016 4:59:22 PM By: [name] MD Entered By: [name] on 08/21/2016 14:41:02 Reitz, [name]  ([id number]) -------------------------------------------------------------------------------- Physical Exam Details Patient Name: Carrie Mulligan C. Date of Service: 08/21/2016 1:30 PM Medical Record Patient Account Number: [id number] Number: Treating RN: [name] [birth year] (81 y.o. Other Clinician: Date of Birth/Sex: Female) Treating Morgon Pamer Primary Care Provider: [name] Provider/Extender: G Referring Provider: [name] in Treatment: 39 Constitutional Patient is hypertensive.. Pulse regular and within target range for patient.Marland Kitchen Respirations regular, non-labored and within target range.. Temperature is normal and within the target range for the patient.. Vision is not in any distress.. Notes Wound exam; the original wound on the left posterior leg is debrided with  a #3 curet removing surface necrotic material although surprisingly almost underneath this the wound actually looks smaller and healthier oShe arrives today with a substantial skin tear on the right anterior lateral leg. Healthy subcutaneous tissue although I think this will take a while to close Electronic Signature(s) Signed: 08/21/2016 4:59:22 PM By: [name] MD Entered By: [name] on 08/21/2016 14:42:09 Senkbeil, [name] ([id number]) -------------------------------------------------------------------------------- Physician Orders Details Patient Name: Carrie Mulligan C. Date of Service: 08/21/2016 1:30 PM Medical Record Patient Account Number: [id number] Number: Treating RN: [name] Jun 04, 1935 (81 y.o. Other Clinician: Date of Birth/Sex: Female) Treating Nirel Babler Primary Care Provider: [name] Provider/Extender: G Referring Provider: [name] in Treatment: 39 Verbal / Phone Orders: Yes Clinician: Carolyne Fiscal, Debi Read Back and Verified: Yes Diagnosis Coding Wound Cleansing Wound #10 Right,Lateral Lower  Leg o Clean wound with Normal Saline. o Cleanse wound with mild soap and water o May Shower, gently pat wound dry prior to applying new dressing. Wound #9 Left,Posterior Lower Leg o Clean wound with Normal Saline. o Cleanse wound with mild soap and water o May Shower, gently pat wound dry prior to applying new dressing. Anesthetic Wound #10 Right,Lateral Lower Leg o Topical Lidocaine 4% cream applied to wound bed prior to debridement - for clinic use Wound #9 Left,Posterior Lower Leg o Topical Lidocaine 4% cream applied to wound bed prior to debridement - for clinic use Primary Wound Dressing Wound #10 Right,Lateral Lower Leg o Promogran - plain collagen ****NO SILVER**** Wound #9 Left,Posterior Lower Leg o Promogran - plain collagen ****NO SILVER**** Secondary Dressing Wound #10 Right,Lateral Lower Leg o ABD pad o Dry Gauze o Drawtex Wound #9 Left,Posterior Lower Leg o ABD pad Minkoff, Diego C. ([id number]) o Dry Gauze Dressing Change Frequency Wound #10 Right,Lateral Lower Leg o Change dressing every week - only at wound care center Wound #9 Left,Posterior Lower Leg o Change dressing every week - only at wound care center Follow-up Appointments Wound #10 Right,Lateral Lower Leg o Return Appointment in 1 week. Wound #9 Left,Posterior Lower Leg o Return Appointment in 1 week. Edema Control Wound #10 Right,Lateral Lower Leg o Kerlix and Coban - Bilateral o Elevate legs to the level of the heart and pump ankles as often as possible Wound #9 Left,Posterior Lower Leg o Kerlix and Coban - Bilateral o Elevate legs to the level of the heart and pump ankles as often as possible Additional Orders / Instructions Wound #10 Right,Lateral Lower Leg o Increase protein intake. Wound #9 Left,Posterior Lower Leg o Increase protein intake. Home Health Wound #10 [place] Visits - [place] Nurse may visit PRN to address patientos wound care needs. o FACE TO FACE ENCOUNTER: MEDICARE and MEDICAID PATIENTS: I certify that this patient is under my care and that I had a face-to-face encounter that meets the physician face-to-face encounter requirements with this patient on this date. The encounter with the patient was in whole or in part for the following MEDICAL CONDITION: (primary reason for [place]) MEDICAL NECESSITY: I certify, that based on my findings, NURSING services are a medically necessary home health service. HOME BOUND STATUS: I certify that my clinical findings support that this patient is homebound (i.e., Due to illness or injury, pt requires aid of supportive devices such as crutches, cane, wheelchairs, walkers, the use of special transportation or the assistance of another person to leave their place of residence. There is a normal inability  to leave the home and doing so requires considerable and taxing effort. Other Posas, Danaiya C. ([id number]) absences are for medical reasons / religious services and are infrequent or of short duration when for other reasons). o If current dressing causes regression in wound condition, may D/C ordered dressing product/s and apply Normal Saline Moist Dressing daily until next [place] / Other MD appointment. [place] of regression in wound condition at [phone]. o Please direct any NON-WOUND related issues/requests for orders to patient's Primary Care Physician Wound #9 [place] Visits - [place] Nurse may visit PRN to address patientos wound care needs. o FACE TO FACE ENCOUNTER: MEDICARE and MEDICAID PATIENTS: I certify that this patient is under my care and that I had a face-to-face encounter that meets the physician face-to-face encounter requirements with this patient on this  date. The encounter with the patient was in whole or in part for the following MEDICAL CONDITION: (primary reason for [place]) MEDICAL NECESSITY: I certify, that based on my findings, NURSING services are a medically necessary home health service. HOME BOUND STATUS: I certify that my clinical findings support that this patient is homebound (i.e., Due to illness or injury, pt requires aid of supportive devices such as crutches, cane, wheelchairs, walkers, the use of special transportation or the assistance of another person to leave their place of residence. There is a normal inability to leave the home and doing so requires considerable and taxing effort. Other absences are for medical reasons / religious services and are infrequent or of short duration when for other reasons). o If current dressing causes regression in wound condition, may D/C ordered dressing product/s and apply Normal Saline Moist Dressing daily until next [place] / Other MD appointment. [place] of regression in wound condition at [phone]. o Please direct any NON-WOUND related issues/requests for orders to patient's Primary Care Physician Electronic Signature(s) Signed: 08/21/2016 4:59:22 PM By: [name] MD Signed: 08/21/2016 5:13:02 PM By: [name] Entered By: [name] on 08/21/2016 14:51:38 Picone, [name] ([id number]) -------------------------------------------------------------------------------- Problem List Details Patient Name: EOWYN, TABONE C. Date of Service: 08/21/2016 1:30 PM Medical Record Patient Account Number: [id number] Number: Treating RN: [name] 07-Feb-1935 (81 y.o. Other Clinician: Date of Birth/Sex: Female) Treating Seven [place] Primary Care Provider: [name] Provider/Extender: G Referring Provider: [name] in Treatment: 39 Active Problems ICD-10 Encounter Code Description  Active Date Diagnosis L97.812 Non-pressure chronic ulcer of other part of right lower leg 04/30/2016 Yes with fat layer exposed I87.333 Chronic venous hypertension (idiopathic) with ulcer and 04/03/2016 Yes inflammation of bilateral lower extremity I89.0 Lymphedema, not elsewhere classified 04/03/2016 Yes M35.36 Chronic systolic (congestive) heart failure 04/03/2016 Yes L97.221 Non-pressure chronic ulcer of left calf limited to 07/03/2016 Yes breakdown of skin Inactive Problems Resolved Problems ICD-10 Code Description Active Date Resolved Date S81.812S Laceration without foreign body, left lower leg, sequela 04/03/2016 04/03/2016 Electronic Signature(s) Signed: 08/21/2016 4:59:22 PM By: [name] MD Entered By: [name] on 08/21/2016 14:38:41 Mccart, [name] ([id number]) 69, [place]. ([id number]) -------------------------------------------------------------------------------- Progress Note Details Patient Name: Carrie Mulligan C. Date of Service: 08/21/2016 1:30 PM Medical Record Patient Account Number: [id number] Number: Treating RN: [name] 1934-10-17 (81 y.o. Other Clinician: Date of Birth/Sex: Female) Treating Conny Situ Primary Care Provider: [name] Provider/Extender: G Referring Provider: [name] in Treatment: 39 Subjective Chief Complaint Information obtained from Patient Patient  here for reevaluation of her right lower externally ulcer History of Present Illness (HPI) 11/22/15; this is Magda is a 82 year old woman who lives at home on her own. According to the patient and her daughter was present she has had long-standing edema in her legs dating back many years. She also has a history of chronic systolic heart failure, atrial fibrillation and is status post mitral valve replacement. [facility] showed an ejection fraction of 40-45% she is on Lasix 60 mg a day and  spironolactone 25 mg a day. Her current problem began in December around Christmas time she developed a small hematoma in the medial part of her left leg which rapidly expanded to a very large hematoma that required surgical debridement. This situation was complicated by the fact that the patient is on long-standing Coumadin for mechanical heart valve. She went to the OR had this evacuated on January 4 /17. The wound has gradually improved however she has developed a small wounds around this area and more recently a wound on the right lateral leg. She is weeping edema fluid. The patient is already been to see vascular surgery. It was recommended that she wear Unna boots, she did not tolerate this due to pain in the left leg. She was then prescribed Juzo stockings and really can't get these on herself although truthfully there is probably too much edema for a Juzo stockings currently. She is not a diabetic and has no history of PAD or claudication that I could elicit. She does not use the external compression pumps reliably. She comes today with notes from her primary physician and Dr. Ronalee Belts both recommending various forms of compression but the patient does not really complied with them. Has been using the external compression pumps with some regularity but certainly not daily on the right leg and this has helped. I also note that her daughter tells me the history that she does not sleep in bed at home. She has a hospital bed but with her legs up she finds this painful so she sleeps in the couch sitting up with her legs dependent. 11/29/15; the patient is arrives today accompanied by her son. He expresses satisfaction that she is maintained the compression all week. 12/06/15; the patient has kept her Profore light wraps on, we have good edema control no major change in the wounds we have been using Aquacel 12/13/15; changed to RTD last week. One of the 3 wounds on her left medial leg is healed she  has 2 remaining wounds here and one on the right lateral leg. 12/18/2015 -- the patient was at Dr. Nino Parsley office today and he was seeing her for an arterial study. While the wrap was being removed she had an inadvertent laceration of the left proximal anterior leg which bled quite profusely and a compression dressing was applied over this and the patient was here to get her Profore wraps done. I was asked to see the patient to make an assessment and treat appropriately. 12/26/15; the patient's injury on the left proximal leg and Steri-Strips removed after soaking. There is an open area here. The original wounds to still open on the right lateral and left medial leg. MALYAH, OHLRICH ([id number]) 01/03/16 patient's injury on her proximal left leg looks quite good. Still small open area on the medial left leg which appears to be improving. The area on the right lateral leg still substantially open with no real improvement in wound depth. Her edema  control is marginal with a Profore light. We have been using RTD for 3-4 weeks without any major change 01/10/16: wounds without s/s of infection. vascular results are pending regarding arterial studies. 01/17/16; patient comes in today complaining of severe pain however I think most of this is in the right hip not related to her wounds. She continues with a oval-shaped wound on the right lateral leg, trauma to the left anterior leg just below her tibial plateau. She has a smaller eschar on the left anterior leg. She is being using Prisma however she informs [place] today that she is actually allergic to silver, nose this from a previous application at [facility] some years ago 01/24/16; edema is not so well controlled today, I think I reduced her to [place] bilaterally last week. The area which was a scissor injury on her left upper anterior lower leg is fully epithelialized. She only has a small open area remaining on the medial aspect of her left leg. The  oval-shaped wound on the right mid lateral leg may be a bit smaller. Debrided of surface slough nonviable subcutaneous tissue. I had changed to collagen 2 weeks ago in an attempt to get this to close 01/31/16 all the patient's wounds on her left leg give healed. We have good edema control with bilateral Profore lites which she has been compliant with. She still has the oval-shaped wound on the right lateral calf allergic even this appears to be slowly improving. The patient has juxtalite stockings at home. She states she thinks she can put these on. She also has external compression pumps at home although I think her compliance with this has not been good in the past. She complains today of edema in her thighs. Tells me she takes 40 mg of Lasix daily 02/21/16; only 1 small wound remains on the lateral aspect of the right calf. She is using Juzo stockings on the left leg although she complains about difficulty in applying them. She has external compression pumps at home 02/28/16; the small open wound on her right lateral calf is improved in terms of wound area. It appears that she has a wrap injury on the anterior aspect of the upper leg 03/06/16; the small open wound on her right lateral calf has a very small open area remaining. The wrap injury on the anterior aspect of the upper leg also appears better. She arrives today in clinic with a history of dyspnea with minimal exertion starting with the last 2-4 days. She does not describe chest pain. Her son and our intake nurse think she has facial swelling. She has a history of an artificial mitral valve on Coumadin 03/13/16; the small wound on the right lateral calf is no better this week. The superior wrap injury anteriorly requires debridement of surface eschar and nonviable subcutaneous tissue. She has been to her primary doctor with regards to her dyspnea we identified last week. Per the patient's description her Lasix has been increased 03/21/2016  -- patient of Dr. Dellia Nims who could not keep her appointment yesterday but has come in today with the right lower leg looking good and this is the leg which has been treated in the recent past. However, her left lower extremity is extremely swollen, tender and edematous with redness and discoloration. 03/27/16; there is only 2 small areas remain on the right leg. The edema that was so concerning last week has come down however there is extensive bruising on the lateral left leg medial left foot suggesting  that she lost some blood into the leg itself. I checked her hemoglobin on 9/21 was 14.5. Her INR was over 6. Duplex ultrasound was negative for DVT 04/03/16 at this point in time today patient is actually doing substantially better in regard to the wound on the anterior right lower extremity. currently there is no slough or eschar noted and no evidence of erythema, discharge, or local infection. She is exhibiting no signs of systemic infection. She is tolerating the dressing changes as well as the wrapping at this point in time. 04/24/16; I have not seen Mrs. Mcgaughy for 3 weeks. Apparently she was admitted to hospital for respiratory issues/also apparently has had multiple compression fractures and had kyphoplasty. When she was last here she only had one small open area remaining on the left leg she was using juxtalite stockings. Her son MARCHIA, DIGUGLIELMO ([id number]) is upset about restarting the Coumadin which she blames or the swelling in her legs. She is on Coumadin for prophylaxis with a chronic artificial heart valve. 04/30/16 at this point in time patient has been tolerating the 3 layer compression wrap as well as the calcium alginate. 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 With tthat being said she does have the continued wound of the left lower extremity as well as the right medial lower extremity. The right side is significantly smaller compared to the left but actually is  slough covered. fortunately she has no significant tenderness at rest although with manipulation of the right location of the wound this is significantly tender. 05/07/16 for follow-up evaluation today both the patient's wounds appear to be significantly improved in size compared to last week. She is also having less pain at both locations currently. Her pain level at most is related to be a 1-2 out of 10. She does have some discharge but fortunately no evidence of infection at this point.she also continues to tolerate the compression wraps well. 05/14/16; the patient's wound on the left leg actually looks as though it's on its way to closure. She is using collagen to this area. She has a small punched out area over the right medial calf. The cause of this is not really clear it has probably 0.4 cm of depth. She has an IV in her right hand which she states is for IV fluid when she develops low sodiums her potassiums, the etiology of this is not clear 05-21-16 she presents today with continued ulcerations the right lower extremity, posterior aspect. she has been using Iodoflex and compression therapy to the area. She denies any new injuries or trauma. She does have 2 areas proximal to this ulceration of dry crust along with an area to her left posterior lower leg that is purple discolored area appears similar to how her current ulceration started, she denies any trauma or injury to the left posterior leg we will monitor all of these areas. 05/28/16; the patient is down to 1 small open area on the right medial mid calf. This is however larger and in 50% of the surface area deeper approximately 0.4 cm. The reason for this deterioration is unclear. I've gone ahead and done a culture of this area she also tells [place] today that she is short of breath. She is also noted a swelling on her lower left eyelid 06/05/16; the patient is 1 small open area on the right medial mid calf. This looks about the same as  last week. The deep area, medial 50% of the wound looks about  the same. CandS I did last week was negative. The entire area looks about the same 06/12/16; not much change in this over the course of the last week. Patient comes in today complaining of extreme back pain. Apparently she has either a kyphoplasty or vertebral plasty scheduled at [facility] radiology tomorrow. For this reason no debridement today. We have been using Iodoflex 06/19/16; patient arrived with the surface eschar from last week removed with a curet debrided of subcutaneous tissue. She tolerates this reasonably well. She is still in a skilled facility. States her vertebral plasty last week is made her pain bearable 06/26/16; patient arrived with a new wound on the posterior aspect of her left calf. She notices 3 or 4 days ago but is not certain how this happened. She states she simply felt a stinging sensation and one day on her foot and then the next day on the back of her calf. 06/28/2016 -- Ms. Reubin Milan called urgently saying that the pain in her left foot was drastically worse over the last several hours and though she removed her compression wrap she wanted to be seen before the long holiday. 07/03/16; she came in to see Dr. Con Memos on 12/29 for pain in her left foot/heel. Nothing much was found at the time and she states that the pain is better but involved her whole foot. She has a wound on the lateral aspect of the left calf and the medial aspect of the right. The medial 1 looks as if it is just about healed however the left one still has considerable edema. 07/09/16; she arrives in clinic today with a dime-sized wound on the left posterior calf and a small linear area on the right medial calf. The area on the left was debrided with a #3 curet nonviable subcutaneous tissue, we are not making progress here. The area on the right was also debrided of surface eschar and nonviable subcutaneous tissue this has some depth to  it. 07/16/16; she has a dime size wound on the left posterior calf that has some depth. This required debridement last week, although I did not feel the need to do this today. We used Iodoflex last week. Small Cumming, Sammi C. ([id number]) area on the right also was not debrided. This looks like it may be close to closing 07/23/16; using Iodoflex to both wound areas. The area on the right posterior/medial calf is just about closed. She still has a fairly substantial wound on the left posterior leg which is still problematic. 07/30/16; the area on the posterior right leg is closed. The patient will graduate to her own pressure stocking. She has a punched-out area on the left mid calf. The surface is better than last week. I did not attempt debridement. Continuing Iodoflex Is complaining about pain in the right lateral ribs this is not pleuritic. She apparently had a chest x-ray done at home the immobile X x-ray. I don't have access to that report. She is not complaining of inspiratory chest pain cough etc. 08/07/16; the posterior right leg wound is closed and she is wearing a compression stocking her son expresses satisfaction with her compliance with the stocking. He continues to have a fairly substantial punched-out circular area on the left posterior calf. This wound only came about in late December, she is not really sure how it happened. 08/13/16; no major change; small punched out wound on the left posterior calf. I have reviewed vascular hx. Followed by Dr. Ronalee Belts, not felt to have PAD  08/21/16; the area on her left posterior calf still a small punched out wound. Debrided this with a #3 curet of surface necrotic material however the wound continues to clean up quite nicely and I think the dimensions overall her smaller. We have been using Promogran. UNFORTUNATELY the patient arrives today with a fairly substantial skin tear on the right anterior lateral leg that was sustained when an aide  was helping her put on her compression stockings. This happened 3 days ago. We will continue Promogran to all wound areas now including the right leg again. She is asked to keep the wraps that we put on in place and we will discharge home health today at her request Objective Constitutional Patient is hypertensive.. Pulse regular and within target range for patient.Marland Kitchen Respirations regular, non-labored and within target range.. Temperature is normal and within the target range for the patient.. Vision is not in any distress.. Vitals Time Taken: 1:34 PM, Height: 68 in, Weight: 178 lbs, BMI: 27.1, Temperature: 98.3 F, Pulse: 70 bpm, Respiratory Rate: 16 breaths/min, Blood Pressure: 152/68 mmHg. General Notes: Wound exam; the original wound on the left posterior leg is debrided with a #3 curet removing surface necrotic material although surprisingly almost underneath this the wound actually looks smaller and healthier She arrives today with a substantial skin tear on the right anterior lateral leg. Healthy subcutaneous tissue although I think this will take a while to close Integumentary (Hair, Skin) Wound #10 status is Open. Original cause of wound was Shear/Friction. The wound is located on the Right,Lateral Lower Leg. The wound measures 2.2cm length x 5.5cm width x 0.1cm depth; 9.503cm^2 area Stanfill, Vaness C. ([id number]) and 0.95cm^3 volume. There is no tunneling or undermining noted. There is a large amount of serosanguineous drainage noted. The wound margin is distinct with the outline attached to the wound base. There is large (67-100%) red granulation within the wound bed. There is no necrotic tissue within the wound bed. Periwound temperature was noted as No Abnormality. The periwound has tenderness on palpation. Wound #9 status is Open. Original cause of wound was Gradually Appeared. The wound is located on the Left,Posterior Lower Leg. The wound measures 1.4cm length x 1cm width x  0.1cm depth; 1.1cm^2 area and 0.11cm^3 volume. The wound is limited to skin breakdown. There is no tunneling or undermining noted. There is a large amount of serosanguineous drainage noted. The wound margin is flat and intact. There is medium (34-66%) red granulation within the wound bed. There is a medium (34-66%) amount of necrotic tissue within the wound bed including Adherent Slough. The periwound skin appearance exhibited: Ecchymosis. Periwound temperature was noted as No Abnormality. The periwound has tenderness on palpation. Assessment Active Problems ICD-10 L97.812 - Non-pressure chronic ulcer of other part of right lower leg with fat layer exposed I87.333 - Chronic venous hypertension (idiopathic) with ulcer and inflammation of bilateral lower extremity I89.0 - Lymphedema, not elsewhere classified U31.49 - Chronic systolic (congestive) heart failure L97.221 - Non-pressure chronic ulcer of left calf limited to breakdown of skin Procedures Wound #9 Wound #9 is a Venous Leg Ulcer located on the Left,Posterior Lower Leg . There was a Skin/Subcutaneous Tissue Debridement ([id number]) debridement with total area of 1.4 sq cm performed by [name], MD. with the following instrument(s): Curette to remove Viable and Non-Viable tissue/material including Exudate, Fibrin/Slough, and Subcutaneous after achieving pain control using Lidocaine 4% Topical Solution. A time out was conducted at 14:10, prior to the start of the procedure. A  Minimum amount of bleeding was controlled with Pressure. The procedure was tolerated well with a pain level of 0 throughout and a pain level of 0 following the procedure. Post Debridement Measurements: 1.4cm length x 1cm width x 0.2cm depth; 0.22cm^3 volume. Character of Wound/Ulcer Post Debridement requires further debridement. Severity of Tissue Post Debridement is: Fat layer exposed. Post procedure Diagnosis Wound #9: Same as Pre-Procedure Cawthorn,  Mychele C. ([id number]) Plan Wound Cleansing: Wound #10 Right,Lateral Lower Leg: Clean wound with Normal Saline. Cleanse wound with mild soap and water May Shower, gently pat wound dry prior to applying new dressing. Wound #9 Left,Posterior Lower Leg: Clean wound with Normal Saline. Cleanse wound with mild soap and water May Shower, gently pat wound dry prior to applying new dressing. Anesthetic: Wound #10 Right,Lateral Lower Leg: Topical Lidocaine 4% cream applied to wound bed prior to debridement - for clinic use Wound #9 Left,Posterior Lower Leg: Topical Lidocaine 4% cream applied to wound bed prior to debridement - for clinic use Primary Wound Dressing: Wound #10 Right,Lateral Lower Leg: Promogran - plain collagen ****NO SILVER**** Wound #9 Left,Posterior Lower Leg: Promogran - plain collagen ****NO SILVER**** Secondary Dressing: Wound #10 Right,Lateral Lower Leg: ABD pad Dry Gauze Drawtex Wound #9 Left,Posterior Lower Leg: ABD pad Dry Gauze Dressing Change Frequency: Wound #10 Right,Lateral Lower Leg: Change dressing every week - only at wound care center Wound #9 Left,Posterior Lower Leg: Change dressing every week - only at wound care center Follow-up Appointments: Wound #10 Right,Lateral Lower Leg: Return Appointment in 1 week. Wound #9 Left,Posterior Lower Leg: Return Appointment in 1 week. Edema Control: Wound #10 Right,Lateral Lower Leg: Kerlix and Coban - Bilateral Elevate legs to the level of the heart and pump ankles as often as possible Wound #9 Left,Posterior Lower Leg: Kerlix and Coban - Bilateral Elevate legs to the level of the heart and pump ankles as often as possible Additional Orders / InstructionsZENIYAH, PEASTER. ([id number]) Wound #10 Right,Lateral Lower Leg: Increase protein intake. Wound #9 Left,Posterior Lower Leg: Increase protein intake. Home Health: Wound #10 Right,Lateral Lower Leg: Continue Home Health Visits - [place] Nurse may visit PRN to address patient s wound care needs. FACE TO FACE ENCOUNTER: MEDICARE and MEDICAID PATIENTS: I certify that this patient is under my care and that I had a face-to-face encounter that meets the physician face-to-face encounter requirements with this patient on this date. The encounter with the patient was in whole or in part for the following MEDICAL CONDITION: (primary reason for [place]) MEDICAL NECESSITY: I certify, that based on my findings, NURSING services are a medically necessary home health service. HOME BOUND STATUS: I certify that my clinical findings support that this patient is homebound (i.e., Due to illness or injury, pt requires aid of supportive devices such as crutches, cane, wheelchairs, walkers, the use of special transportation or the assistance of another person to leave their place of residence. There is a normal inability to leave the home and doing so requires considerable and taxing effort. Other absences are for medical reasons / religious services and are infrequent or of short duration when for other reasons). If current dressing causes regression in wound condition, may D/C ordered dressing product/s and apply Normal Saline Moist Dressing daily until next [place] / Other MD appointment. [place] of regression in wound condition at [phone]. Please direct any NON-WOUND related issues/requests for orders to patient's Primary Care Physician Wound #9 Left,Posterior Lower Leg: Continue  Home Health Visits - [place] Nurse may visit PRN to address patient s wound care needs. FACE TO FACE ENCOUNTER: MEDICARE and MEDICAID PATIENTS: I certify that this patient is under my care and that I had a face-to-face encounter that meets the physician face-to-face encounter requirements with this patient on this date. The encounter with the patient was in whole or in part for  the following MEDICAL CONDITION: (primary reason for [place]) MEDICAL NECESSITY: I certify, that based on my findings, NURSING services are a medically necessary home health service. HOME BOUND STATUS: I certify that my clinical findings support that this patient is homebound (i.e., Due to illness or injury, pt requires aid of supportive devices such as crutches, cane, wheelchairs, walkers, the use of special transportation or the assistance of another person to leave their place of residence. There is a normal inability to leave the home and doing so requires considerable and taxing effort. Other absences are for medical reasons / religious services and are infrequent or of short duration when for other reasons). If current dressing causes regression in wound condition, may D/C ordered dressing product/s and apply Normal Saline Moist Dressing daily until next [place] / Other MD appointment. [place] of regression in wound condition at [phone]. Please direct any NON-WOUND related issues/requests for orders to patient's Primary Care Physician collagen to now both areas,ABD, kerlix and coban to remain in place all week Electronic Signature(s) KATHALENE, SPORER ([id number]) Signed: 08/23/2016 9:14:05 AM By: Gretta Cool RN, BSN, Kim RN, BSN Signed: 08/28/2016 3:57:30 PM By: [name] MD Previous Signature: 08/21/2016 4:59:22 PM Version By: [name] MD Entered By: Gretta Cool, RN, BSN, Kim on 08/23/2016 09:14:05 Minniear, [name] ([id number]) -------------------------------------------------------------------------------- SuperBill Details Patient Name: LORIELLE, BOEHNING C. Date of Service: 08/21/2016 Medical Record Patient Account Number: [id number] Number: Treating RN: [name] 04-11-1935 (81 y.o. Other Clinician: Date of Birth/Sex: Female) Treating Josean Lycan Primary Care Provider: [name] Provider/Extender:  G Referring Provider: [name] Service Line: Outpatient Weeks in Treatment: 39 Diagnosis Coding ICD-10 Codes Code Description [phone] Non-pressure chronic ulcer of other part of right lower leg with fat layer exposed Chronic venous hypertension (idiopathic) with ulcer and inflammation of bilateral lower I87.333 extremity I89.0 Lymphedema, not elsewhere classified V77.93 Chronic systolic (congestive) heart failure L97.221 Non-pressure chronic ulcer of left calf limited to breakdown of skin Facility Procedures CPT4 Code Description: [id number] 11042 - DEB SUBQ TISSUE 20 SQ CM/< ICD-10 Description Diagnosis L97.221 Non-pressure chronic ulcer of left calf limited to Modifier: breakdown of sk Quantity: 1 in Physician Procedures CPT4 Code Description: 3007622 63335 - WC PHYS SUBQ TISS 20 SQ CM ICD-10 Description Diagnosis L97.221 Non-pressure chronic ulcer of left calf limited to Modifier: breakdown of ski Quantity: 1 n Electronic Signature(s) Signed: 08/21/2016 4:59:22 PM By: [name] MD Entered By: [name] on 08/21/2016 14:43:39

## 2016-08-22 NOTE — Progress Notes (Signed)
JANAVIA, ROTTMAN ([id number]) Visit Report for 08/21/2016 Arrival Information Details Patient Name: RAGHAD, LORENZ. Date of Service: 08/21/2016 1:30 PM Medical Record Patient Account Number: [id number] Number: Treating RN: [name] Jun 11, 1935 (81 y.o. Other Clinician: Date of Birth/Sex: Female) Treating [name], [name] Primary Care Sharae Zappulla: [name] Zia Najera/Extender: G Referring Shashwat Cleary: [name] in Treatment: 39 Visit Information History Since Last Visit All ordered tests and consults were completed: No Patient Arrived: Wheel Chair Added or deleted any medications: No Arrival Time: 13:33 Any new allergies or adverse reactions: No Accompanied By: son Had a fall or experienced change in No Transfer Assistance: EasyPivot Patient activities of daily living that may affect Lift risk of falls: Patient Identification Verified: Yes Signs or symptoms of abuse/neglect since last No Secondary Verification Process Yes visito Completed: Hospitalized since last visit: No Patient Requires Transmission- No Has Dressing in Place as Prescribed: Yes Based Precautions: Has Compression in Place as Prescribed: Yes Patient Has Alerts: Yes Pain Present Now: No Patient Alerts: Patient on Blood Thinner Switzer!!! Electronic Signature(s) Signed: 08/21/2016 5:13:02 PM By: [name] Entered By: [name] on 08/21/2016 13:34:16 Benscoter, [name] ([id number]) -------------------------------------------------------------------------------- Encounter Discharge Information Details Patient Name: [name] Date of Service: 08/21/2016 1:30 PM Medical Record Patient Account Number: [id number] Number: Treating RN: [name] [birth year] (81 y.o. Other Clinician: Date of Birth/Sex: Female) Treating [name], [name] Primary Care Hilman Kissling: [name] Frayda Egley/Extender: G Referring  Suhaan Perleberg: [name] in Treatment: 39 Encounter Discharge Information Items Discharge Pain Level: 0 Discharge Condition: Stable Ambulatory Status: Wheelchair Discharge Destination: Home Transportation: Private Auto Accompanied By: son Schedule Follow-up Appointment: Yes Medication Reconciliation completed and provided to Patient/Care No Danaka Llera: Provided on Clinical Summary of Care: 08/21/2016 Form Type Recipient Paper Patient [place] Electronic Signature(s) Signed: 08/21/2016 2:34:19 PM By: Ruthine Dose Entered By: Ruthine Dose on 08/21/2016 14:34:18 Rosensteel, [name] ([id number]) -------------------------------------------------------------------------------- Lower Extremity Assessment Details Patient Name: [name] Date of Service: 08/21/2016 1:30 PM Medical Record Patient Account Number: [id number] Number: Treating RN: [name] [birth year] (81 y.o. Other Clinician: Date of Birth/Sex: Female) Treating [name], [place] Primary Care Simmone Cape: [name] Brittane Grudzinski/Extender: G Referring Leovardo Thoman: [name] in Treatment: 39 Edema Assessment Assessed: [Left: No] [Right: No] E[Left: dema] [Right: :] Calf Left: Right: Point of Measurement: 34 cm From Medial Instep 31.2 cm cm Ankle Left: Right: Point of Measurement: 10 cm From Medial Instep 21 cm cm Vascular Assessment Pulses: Dorsalis Pedis Palpable: [Left:Yes] Posterior Tibial Extremity colors, hair growth, and conditions: Extremity Color: [Left:Hyperpigmented] Temperature of Extremity: [Left:Warm] Capillary Refill: [Left:< 3 seconds] Electronic Signature(s) Signed: 08/21/2016 5:13:02 PM By: [name] Entered By: [name] on 08/21/2016 13:50:26 Herbert, Sundra Loletha Grayer ([id number]) -------------------------------------------------------------------------------- Multi Wound Chart Details Patient Name: [name] Date of Service: 08/21/2016 1:30  PM Medical Record Patient Account Number: [id number] Number: Treating RN: [name] [birth year] (81 y.o. Other Clinician: Date of Birth/Sex: Female) Treating [name], [name] Primary Care Iren Whipp: [name] Stillman Buenger/Extender: G Referring Jamarii Banks: [name] in Treatment: 39 Vital Signs Height(in): 68 Pulse(bpm): 70 Weight(lbs): 178 Blood Pressure 152/68 (mmHg): Body Mass Index(BMI): 27 Temperature(F): 98.3 Respiratory Rate 16 (breaths/min): Photos: [10:No Photos] [9:No Photos] [N/A:N/A] Wound Location: [10:Right Lower Leg - Lateral] [9:Left Lower Leg - Posterior] [N/A:N/A] Wounding Event: [10:Shear/Friction] [9:Gradually Appeared] [N/A:N/A] Primary Etiology: [10:Skin Tear] [9:Venous Leg Ulcer] [N/A:N/A] Comorbid History: [10:Cataracts, Arrhythmia, Hypotension, Osteoarthritis] [9:Cataracts, Arrhythmia, Hypotension, Osteoarthritis] [N/A:N/A] Date Acquired: [10:08/20/2016] [9:06/26/2016] [N/A:N/A] Weeks of Treatment: [10:0] [9:8] [  N/A:N/A] Wound Status: [10:Open] [9:Open] [N/A:N/A] Measurements L x W x D 2.2x5.5x0.1 [9:1.4x1x0.1] [N/A:N/A] (cm) Area (cm) : [10:9.503] [9:1.1] [N/A:N/A] Volume (cm) : [10:0.95] [9:0.11] [N/A:N/A] % Reduction in Area: [10:N/A] [9:81.30%] [N/A:N/A] % Reduction in Volume: N/A [9:81.30%] [N/A:N/A] Classification: [10:Partial Thickness] [9:Full Thickness Without Exposed Support Structures] [N/A:N/A] Exudate Amount: [10:Large] [9:Large] [N/A:N/A] Exudate Type: [10:Serosanguineous] [9:Serosanguineous] [N/A:N/A] Exudate Color: [10:red, brown] [9:red, brown] [N/A:N/A] Wound Margin: [10:Distinct, outline attached Flat and Intact] [N/A:N/A] Granulation Amount: [10:Large (67-100%)] [9:Medium (34-66%)] [N/A:N/A] Granulation Quality: [10:Red] [9:Red] [N/A:N/A] Necrotic Amount: [10:None Present (0%)] [9:Medium (34-66%)] [N/A:N/A] Epithelialization: [10:None] [9:None] [N/A:N/A] Debridement: [10:N/A] [N/A:N/A] Debridement  ([id number]) Pre-procedure N/A 14:10 N/A Verification/Time Out Taken: Pain Control: N/A Lidocaine 4% Topical N/A Solution Tissue Debrided: N/A Fibrin/Slough, Exudates, N/A Subcutaneous Level: N/A Skin/Subcutaneous N/A Tissue Debridement Area (sq N/A 1.4 N/A cm): Instrument: N/A Curette N/A Bleeding: N/A Minimum N/A Hemostasis Achieved: N/A Pressure N/A Procedural Pain: N/A 0 N/A Post Procedural Pain: N/A 0 N/A Debridement Treatment N/A Procedure was tolerated N/A Response: well Post Debridement N/A 1.4x1x0.2 N/A Measurements L x W x D (cm) Post Debridement N/A 0.22 N/A Volume: (cm) Periwound Skin Texture: No Abnormalities Noted No Abnormalities Noted N/A Periwound Skin No Abnormalities Noted No Abnormalities Noted N/A Moisture: Periwound Skin Color: No Abnormalities Noted Ecchymosis: Yes N/A Temperature: No Abnormality No Abnormality N/A Tenderness on Yes Yes N/A Palpation: Wound Preparation: Ulcer Cleansing: Ulcer Cleansing: N/A Rinsed/Irrigated with Rinsed/Irrigated with Saline Saline Topical Anesthetic Topical Anesthetic Applied: Other: lidocaine Applied: Other: lidociane 4% 4% Procedures Performed: N/A Debridement N/A Treatment Notes Wound #10 (Right, Lateral Lower Leg) 1. Cleansed with: Clean wound with Normal Saline 2. Anesthetic Topical Lidocaine 4% cream to wound bed prior to debridement 4. Dressing Applied: Promogran Fissel, Will C. ([id number]) 5. Secondary Dressing Applied ABD Pad Dry Gauze 7. Secured with Tape Notes kerlix, coban Wound #9 (Left, Posterior Lower Leg) 1. Cleansed with: Clean wound with Normal Saline 2. Anesthetic Topical Lidocaine 4% cream to wound bed prior to debridement 4. Dressing Applied: Promogran 5. Secondary Dressing Applied ABD Pad Dry Gauze 7. Secured with Tape Notes kerlix, coban Electronic Signature(s) Signed: 08/21/2016 4:59:22 PM By: [name] MD Entered By: [name] on 08/21/2016  14:38:52 Hehn, [name] ([id number]) -------------------------------------------------------------------------------- Multi-Disciplinary Care Plan Details Patient Name: ROSINA, CRESSLER. Date of Service: 08/21/2016 1:30 PM Medical Record Patient Account Number: [id number] Number: Treating RN: [name] 1934-12-24 (81 y.o. Other Clinician: Date of Birth/Sex: Female) Treating [name], [name] Primary Care Milyn Stapleton: [name] Alazar Cherian/Extender: G Referring Patrece Tallie: [name] in Treatment: 39 Active Inactive ` Orientation to the Wound Care Program Nursing Diagnoses: Knowledge deficit related to the wound healing center program Goals: Patient/caregiver will verbalize understanding of the [place] Date Initiated: 11/22/2015 Target Resolution Date: 09/28/2016 Goal Status: Active Interventions: Provide education on orientation to the wound center Notes: ` Venous Leg Ulcer Nursing Diagnoses: Knowledge deficit related to disease process and management Potential for venous Insuffiency (use before diagnosis confirmed) Goals: Patient will maintain optimal edema control Date Initiated: 11/22/2015 Target Resolution Date: 09/28/2016 Goal Status: Active Patient/caregiver will verbalize understanding of disease process and disease management Date Initiated: 11/22/2015 Target Resolution Date: 09/28/2016 Goal Status: Active Verify adequate tissue perfusion prior to therapeutic compression application Date Initiated: 11/22/2015 Target Resolution Date: 09/28/2016 Goal Status: Active Interventions: LANA, FLAIM ([id number]) Assess peripheral edema status every visit. Compression as ordered Provide education on venous insufficiency Treatment Activities: Non-invasive vascular studies : 11/22/2015 Therapeutic compression applied : 11/22/2015 Notes: `  Wound/Skin Impairment Nursing Diagnoses: Impaired tissue integrity Knowledge  deficit related to ulceration/compromised skin integrity Goals: Patient/caregiver will verbalize understanding of skin care regimen Date Initiated: 11/22/2015 Target Resolution Date: 09/28/2016 Goal Status: Active Ulcer/skin breakdown will have a volume reduction of 30% by week 4 Date Initiated: 11/22/2015 Target Resolution Date: 09/28/2016 Goal Status: Active Ulcer/skin breakdown will have a volume reduction of 50% by week 8 Date Initiated: 11/22/2015 Target Resolution Date: 09/28/2016 Goal Status: Active Ulcer/skin breakdown will have a volume reduction of 80% by week 12 Date Initiated: 11/22/2015 Target Resolution Date: 09/28/2016 Goal Status: Active Ulcer/skin breakdown will heal within 14 weeks Date Initiated: 11/22/2015 Target Resolution Date: 09/28/2016 Goal Status: Active Interventions: Assess patient/caregiver ability to perform ulcer/skin care regimen upon admission and as needed Assess ulceration(s) every visit Provide education on ulcer and skin care Treatment Activities: Skin care regimen initiated : 11/22/2015 Topical wound management initiated : 11/22/2015 Notes: DEAUNNA, OLARTE ([id number]) Electronic Signature(s) Signed: 08/21/2016 5:13:02 PM By: [name] Entered By: [name] on 08/21/2016 13:50:32 Millar, Mozelle Loletha Grayer ([id number]) -------------------------------------------------------------------------------- Pain Assessment Details Patient Name: [name] Date of Service: 08/21/2016 1:30 PM Medical Record Patient Account Number: [id number] Number: Treating RN: [name] [birth year] (81 y.o. Other Clinician: Date of Birth/Sex: Female) Treating [name], [name] Primary Care Felton Buczynski: [name] Lizett Chowning/Extender: G Referring Biridiana Twardowski: [name] in Treatment: 39 Active Problems Location of Pain Severity and Description of Pain Patient Has Paino No Site Locations With Dressing Change: No Pain Management and  Medication Current Pain Management: Electronic Signature(s) Signed: 08/21/2016 5:13:02 PM By: [name] Entered By: [name] on 08/21/2016 13:34:21 Amor, [name] ([id number]) -------------------------------------------------------------------------------- Patient/Caregiver Education Details Patient Name: [name] Date of Service: 08/21/2016 1:30 PM Medical Record Patient Account Number: [id number] Number: Treating RN: [name] March 02, 1935 (81 y.o. Other Clinician: Date of Birth/Gender: Female) Treating [name], [name] Primary Care Physician/Extender: Claudette Laws Physician: Suella [place] in Treatment: 39 Referring Physician: [name] Education Assessment Education Provided To: Patient Education Topics Provided Wound/Skin Impairment: Handouts: Other: change dressing as ordered and do not get dressing wet Methods: Demonstration, Explain/Verbal Responses: State content correctly Electronic Signature(s) Signed: 08/21/2016 5:13:02 PM By: [name] Entered By: [name] on 08/21/2016 14:06:02 Mostafa, Lynn C. ([id number]) -------------------------------------------------------------------------------- Wound Assessment Details Patient Name: [name], Johnell C. Date of Service: 08/21/2016 1:30 PM Medical Record Patient Account Number: [id number] Number: Treating RN: [name] 10/31/1934 (81 y.o. Other Clinician: Date of Birth/Sex: Female) Treating [name], [name] Primary Care Vallorie Niccoli: [name] Wajiha Versteeg/Extender: G Referring Nylani Michetti: [name] in Treatment: 39 Wound Status Wound Number: 10 Primary Skin Tear Etiology: Wound Location: Right Lower Leg - Lateral Wound Status: Open Wounding Event: Shear/Friction Comorbid Cataracts, Arrhythmia, Hypotension, Date Acquired: 08/20/2016 History: Osteoarthritis Weeks Of Treatment: 0 Clustered Wound: No Photos Photo Uploaded By:  [name] on 08/22/2016 07:46:22 Wound Measurements Length: (cm) 2.2 Width: (cm) 5.5 Depth: (cm) 0.1 Area: (cm) 9.503 Volume: (cm) 0.95 % Reduction in Area: % Reduction in Volume: Epithelialization: None Tunneling: No Undermining: No Wound Description Classification: Partial Thickness Foul Odor Aft Wound Margin: Distinct, outline attached Slough/Fibrin Exudate Amount: Large Exudate Type: Serosanguineous Exudate Color: red, brown er Cleansing: No o No Wound Bed Granulation Amount: Large (67-100%) Granulation Quality: Red Necrotic Amount: None Present (0%) Orban, Eduarda C. ([id number]) Periwound Skin Texture Texture Color No Abnormalities Noted: No No Abnormalities Noted: No Moisture Temperature / Pain No Abnormalities Noted: No Temperature: No Abnormality Tenderness on Palpation: Yes Wound Preparation Ulcer Cleansing: Rinsed/Irrigated  with Saline Topical Anesthetic Applied: Other: lidocaine 4%, Treatment Notes Wound #10 (Right, Lateral Lower Leg) 1. Cleansed with: Clean wound with Normal Saline 2. Anesthetic Topical Lidocaine 4% cream to wound bed prior to debridement 4. Dressing Applied: Promogran 5. Secondary Dressing Applied ABD Pad Dry Gauze 7. Secured with Tape Notes kerlix, coban Electronic Signature(s) Signed: 08/21/2016 5:13:02 PM By: [name] Entered By: [name] on 08/21/2016 13:45:47 [place], [name] ([id number]) -------------------------------------------------------------------------------- Wound Assessment Details Patient Name: [name], Zariyah C. Date of Service: 08/21/2016 1:30 PM Medical Record Patient Account Number: [id number] Number: Treating RN: [name] Feb 14, 1935 (81 y.o. Other Clinician: Date of Birth/Sex: Female) Treating [name], [place] Primary Care Dub Maclellan: [name] Zarina Pe/Extender: G Referring Osmara Drummonds: [name] in Treatment: 39 Wound Status Wound Number:  9 Primary Venous Leg Ulcer Etiology: Wound Location: Left Lower Leg - Posterior Wound Status: Open Wounding Event: Gradually Appeared Comorbid Cataracts, Arrhythmia, Hypotension, Date Acquired: 06/26/2016 History: Osteoarthritis Weeks Of Treatment: 8 Clustered Wound: No Photos Photo Uploaded By: [name] on 08/22/2016 07:46:22 Wound Measurements Length: (cm) 1.4 Width: (cm) 1 Depth: (cm) 0.1 Area: (cm) 1.1 Volume: (cm) 0.11 % Reduction in Area: 81.3% % Reduction in Volume: 81.3% Epithelialization: None Tunneling: No Undermining: No Wound Description Full Thickness Without Exposed Classification: Support Structures Wound Margin: Flat and Intact Exudate Large Amount: Exudate Type: Serosanguineous Exudate Color: red, brown Foul Odor After Cleansing: No Wound Bed Granulation Amount: Medium (34-66%) Exposed Structure Foiles, Darriana C. ([id number]) Granulation Quality: Red Fascia Exposed: No Necrotic Amount: Medium (34-66%) Fat Layer (Subcutaneous Tissue) Exposed: No Necrotic Quality: Adherent Slough Tendon Exposed: No Muscle Exposed: No Joint Exposed: No Bone Exposed: No Limited to Skin Breakdown Periwound Skin Texture Texture Color No Abnormalities Noted: No No Abnormalities Noted: No Ecchymosis: Yes Moisture No Abnormalities Noted: No Temperature / Pain Temperature: No Abnormality Tenderness on Palpation: Yes Wound Preparation Ulcer Cleansing: Rinsed/Irrigated with Saline Topical Anesthetic Applied: Other: lidociane 4%, Treatment Notes Wound #9 (Left, Posterior Lower Leg) 1. Cleansed with: Clean wound with Normal Saline 2. Anesthetic Topical Lidocaine 4% cream to wound bed prior to debridement 4. Dressing Applied: Promogran 5. Secondary Dressing Applied ABD Pad Dry Gauze 7. Secured with Tape Notes kerlix, coban Electronic Signature(s) Signed: 08/21/2016 5:13:02 PM By: [name] Entered By: [name] on 08/21/2016  13:46:22 [place], [name] ([id number]) -------------------------------------------------------------------------------- Vitals Details Patient Name: [name] Date of Service: 08/21/2016 1:30 PM Medical Record Patient Account Number: [id number] Number: Treating RN: [name] 1934-09-24 (81 y.o. Other Clinician: Date of Birth/Sex: Female) Treating [name], [name] Primary Care Cagney Steenson: [name] Jamarr Treinen/Extender: G Referring Kaydenn Mclear: [name] in Treatment: 39 Vital Signs Time Taken: 13:34 Temperature (F): 98.3 Height (in): 68 Pulse (bpm): 70 Weight (lbs): 178 Respiratory Rate (breaths/min): 16 Body Mass Index (BMI): 27.1 Blood Pressure (mmHg): 152/68 Reference Range: 80 - 120 mg / dl Electronic Signature(s) Signed: 08/21/2016 5:13:02 PM By: [name] Entered By: [name] on 08/21/2016 13:37:32

## 2016-08-28 ENCOUNTER — Encounter: Payer: Medicare Other | Admitting: Internal Medicine

## 2016-08-28 DIAGNOSIS — I87333 Chronic venous hypertension (idiopathic) with ulcer and inflammation of bilateral lower extremity: Secondary | ICD-10-CM | POA: Diagnosis not present

## 2016-08-29 NOTE — Progress Notes (Signed)
DEDRA, MATSUO ([id number]) Visit Report for 08/28/2016 Arrival Information Details Patient Name: [name], [name]. Date of Service: 08/28/2016 3:30 PM Medical Record Patient Account Number: [id number] Number: Treating RN: [name] October 07, 1934 (81 y.o. Other Clinician: Date of Birth/Sex: Female) Treating [name], [name] Primary Care Milbert Bixler: [name] Estefani Bateson/Extender: G Referring Jazzmyn Filion: [name] in Treatment: 44 Visit Information History Since Last Visit All ordered tests and consults were completed: No Patient Arrived: [name] Added or deleted any medications: No Arrival Time: 15:14 Any new allergies or adverse reactions: No Accompanied By: daughter Had a fall or experienced change in No Transfer Assistance: EasyPivot Patient activities of daily living that may affect Lift risk of falls: Patient Identification Verified: Yes Signs or symptoms of abuse/neglect since last No Secondary Verification Process Yes visito Completed: Hospitalized since last visit: No Patient Requires Transmission- No Pain Present Now: No Based Precautions: Patient Has Alerts: Yes Patient Alerts: Patient on Blood Thinner [place]!!! Electronic Signature(s) Signed: 08/28/2016 4:23:48 PM By: [name] Entered By: [name] on 08/28/2016 15:16:14 [name], [name] ([id number]) -------------------------------------------------------------------------------- Encounter Discharge Information Details Patient Name: Carrie Mulligan C. Date of Service: 08/28/2016 3:30 PM Medical Record Patient Account Number: [id number] Number: Treating RN: [name] May 13, 1935 (81 y.o. Other Clinician: Date of Birth/Sex: Female) Treating [name], [name] Primary Care Yani Lal: [name] Yianni Skilling/Extender: G Referring Adin Laker: [name] in Treatment: 40 Encounter Discharge Information  Items Discharge Pain Level: 0 Discharge Condition: Stable Ambulatory Status: Walker Discharge Destination: Home Transportation: Private Auto Accompanied By: daughter Schedule Follow-up Appointment: Yes Medication Reconciliation completed No and provided to Patient/Care Rosaleen Mazer: Provided on Clinical Summary of Care: 08/28/2016 Form Type Recipient Paper Patient [place] Electronic Signature(s) Signed: 08/28/2016 3:54:11 PM By: Ruthine Dose Entered By: Ruthine Dose on 08/28/2016 15:54:11 [name], [name] ([id number]) -------------------------------------------------------------------------------- Lower Extremity Assessment Details Patient Name: Carrie Mulligan C. Date of Service: 08/28/2016 3:30 PM Medical Record Patient Account Number: [id number] Number: Treating RN: [name] 1934-11-07 (81 y.o. Other Clinician: Date of Birth/Sex: Female) Treating [name], [place] Primary Care Nyshaun Standage: [name] Waniya Hoglund/Extender: G Referring Onda Kattner: [name] in Treatment: 40 Edema Assessment Assessed: [Left: No] [Right: No] E[Left: dema] [Right: :] Calf Left: Right: Point of Measurement: 34 cm From Medial Instep 30.4 cm 30.6 cm Ankle Left: Right: Point of Measurement: 10 cm From Medial Instep 20.6 cm 20.5 cm Vascular Assessment Pulses: Dorsalis Pedis Palpable: [Left:Yes] [Right:Yes] Posterior Tibial Extremity colors, hair growth, and conditions: Extremity Color: [Left:Mottled] [Right:Mottled] Temperature of Extremity: [Left:Warm] [Right:Warm] Capillary Refill: [Left:< 3 seconds] [Right:< 3 seconds] Electronic Signature(s) Signed: 08/28/2016 4:23:48 PM By: [name] Entered By: [name] on 08/28/2016 15:31:24 [name], [name] ([id number]) -------------------------------------------------------------------------------- Multi Wound Chart Details Patient Name: Carrie Mulligan C. Date of Service: 08/28/2016 3:30 PM Medical Record  Patient Account Number: [id number] Number: Treating RN: [name] 15-Jul-1934 (81 y.o. Other Clinician: Date of Birth/Sex: Female) Treating [name], [name] Primary Care Aja Bolander: [name] Maecie Sevcik/Extender: G Referring Almeter Westhoff: [name] in Treatment: 40 Vital Signs Height(in): 68 Pulse(bpm): 66 Weight(lbs): 178 Blood Pressure 136/61 (mmHg): Body Mass Index(BMI): 27 Temperature(F): 97.5 Respiratory Rate 16 (breaths/min): Photos: [10:No Photos] [9:No Photos] [N/A:N/A] Wound Location: [10:Right Lower Leg - Lateral] [9:Left Lower Leg - Posterior] [N/A:N/A] Wounding Event: [10:Shear/Friction] [9:Gradually Appeared] [N/A:N/A] Primary Etiology: [10:Skin Tear] [9:Venous Leg Ulcer] [N/A:N/A] Comorbid History: [10:Cataracts, Arrhythmia, Hypotension, Osteoarthritis] [9:Cataracts, Arrhythmia, Hypotension, Osteoarthritis] [N/A:N/A] Date Acquired: [10:08/20/2016] [9:06/26/2016] [N/A:N/A] Weeks of Treatment: [10:1] [9:9] [N/A:N/A] Wound Status: [10:Open] [9:Open] [N/A:N/A] Measurements  L x W x D 2x5.2x0.1 [9:1.2x0.8x0.1] [N/A:N/A] (cm) Area (cm) : [10:8.168] [9:0.754] [N/A:N/A] Volume (cm) : [10:0.817] [9:0.075] [N/A:N/A] % Reduction in Area: [10:14.00%] [9:87.20%] [N/A:N/A] % Reduction in Volume: 14.00% [9:87.30%] [N/A:N/A] Classification: [10:Partial Thickness] [9:Full Thickness Without Exposed Support Structures] [N/A:N/A] Exudate Amount: [10:Large] [9:Large] [N/A:N/A] Exudate Type: [10:Serosanguineous] [9:Serosanguineous] [N/A:N/A] Exudate Color: [10:red, brown] [9:red, brown] [N/A:N/A] Wound Margin: [10:Distinct, outline attached Flat and Intact] [N/A:N/A] Granulation Amount: [10:Large (67-100%)] [9:Large (67-100%)] [N/A:N/A] Granulation Quality: [10:Red] [9:Red] [N/A:N/A] Necrotic Amount: [10:Small (1-33%)] [9:Small (1-33%)] [N/A:N/A] Epithelialization: [10:None] [9:None] [N/A:N/A] Debridement: [10:N/A] [N/A:N/A] Open  Wound/Selective ([id number]) - Selective Pre-procedure N/A 15:36 N/A Verification/Time Out Taken: Pain Control: N/A Lidocaine 4% Topical N/A Solution Tissue Debrided: N/A Fibrin/Slough, Exudates, N/A Subcutaneous Level: N/A Non-Viable Tissue N/A Debridement Area (sq N/A 0.96 N/A cm): Instrument: N/A Curette N/A Bleeding: N/A Minimum N/A Hemostasis Achieved: N/A Pressure N/A Procedural Pain: N/A 0 N/A Post Procedural Pain: N/A 0 N/A Debridement Treatment N/A Procedure was tolerated N/A Response: well Post Debridement N/A 1.2x0.2x0.1 N/A Measurements L x W x D (cm) Post Debridement N/A 0.019 N/A Volume: (cm) Periwound Skin Texture: No Abnormalities Noted No Abnormalities Noted N/A Periwound Skin No Abnormalities Noted No Abnormalities Noted N/A Moisture: Periwound Skin Color: No Abnormalities Noted Ecchymosis: Yes N/A Temperature: No Abnormality No Abnormality N/A Tenderness on Yes Yes N/A Palpation: Wound Preparation: Ulcer Cleansing: Ulcer Cleansing: N/A Rinsed/Irrigated with Rinsed/Irrigated with Saline Saline Topical Anesthetic Topical Anesthetic Applied: Other: lidocaine Applied: Other: lidociane 4% 4% Procedures Performed: N/A Debridement N/A Treatment Notes Wound #10 (Right, Lateral Lower Leg) 1. Cleansed with: Clean wound with Normal Saline Cleanse wound with antibacterial soap and water 2. Anesthetic Topical Lidocaine 4% cream to wound bed prior to debridement 4. Dressing Applied: Promogran Darwin, Nitya C. ([id number]) 5. Secondary Dressing Applied ABD Pad Dry Gauze 7. Secured with Tape Notes kerlix, coban Wound #9 (Left, Posterior Lower Leg) 1. Cleansed with: Clean wound with Normal Saline Cleanse wound with antibacterial soap and water 2. Anesthetic Topical Lidocaine 4% cream to wound bed prior to debridement 4. Dressing Applied: Promogran 5. Secondary Dressing Applied ABD Pad Dry Gauze 7. Secured with Tape Notes kerlix,  coban Electronic Signature(s) Signed: 08/28/2016 3:56:26 PM By: [name] MD Entered By: [name] on 08/28/2016 15:49:54 [name], [name] ([id number]) -------------------------------------------------------------------------------- Multi-Disciplinary Care Plan Details Patient Name: [name], LYNAM. Date of Service: 08/28/2016 3:30 PM Medical Record Patient Account Number: [id number] Number: Treating RN: [name] 16-Jul-1934 (81 y.o. Other Clinician: Date of Birth/Sex: Female) Treating [name], [name] Primary Care Taygan Connell: [name] Montarius Kitagawa/Extender: G Referring Hanin Decook: [name] in Treatment: 18 Active Inactive ` Orientation to the Wound Care Program Nursing Diagnoses: Knowledge deficit related to the wound healing center program Goals: Patient/caregiver will verbalize understanding of the [facility] Date Initiated: 11/22/2015 Target Resolution Date: 09/28/2016 Goal Status: Active Interventions: Provide education on orientation to the wound center Notes: ` Venous Leg Ulcer Nursing Diagnoses: Knowledge deficit related to disease process and management Potential for venous Insuffiency (use before diagnosis confirmed) Goals: Patient will maintain optimal edema control Date Initiated: 11/22/2015 Target Resolution Date: 09/28/2016 Goal Status: Active Patient/caregiver will verbalize understanding of disease process and disease management Date Initiated: 11/22/2015 Target Resolution Date: 09/28/2016 Goal Status: Active Verify adequate tissue perfusion prior to therapeutic compression application Date Initiated: 11/22/2015 Target Resolution Date: 09/28/2016 Goal Status: Active Interventions: [name], [name] ([id number]) Assess peripheral edema status every visit. Compression as ordered Provide education on venous insufficiency Treatment Activities: Non-invasive vascular studies :  11/22/2015 Therapeutic  compression applied : 11/22/2015 Notes: ` Wound/Skin Impairment Nursing Diagnoses: Impaired tissue integrity Knowledge deficit related to ulceration/compromised skin integrity Goals: Patient/caregiver will verbalize understanding of skin care regimen Date Initiated: 11/22/2015 Target Resolution Date: 09/28/2016 Goal Status: Active Ulcer/skin breakdown will have a volume reduction of 30% by week 4 Date Initiated: 11/22/2015 Target Resolution Date: 09/28/2016 Goal Status: Active Ulcer/skin breakdown will have a volume reduction of 50% by week 8 Date Initiated: 11/22/2015 Target Resolution Date: 09/28/2016 Goal Status: Active Ulcer/skin breakdown will have a volume reduction of 80% by week 12 Date Initiated: 11/22/2015 Target Resolution Date: 09/28/2016 Goal Status: Active Ulcer/skin breakdown will heal within 14 weeks Date Initiated: 11/22/2015 Target Resolution Date: 09/28/2016 Goal Status: Active Interventions: Assess patient/caregiver ability to perform ulcer/skin care regimen upon admission and as needed Assess ulceration(s) every visit Provide education on ulcer and skin care Treatment Activities: Skin care regimen initiated : 11/22/2015 Topical wound management initiated : 11/22/2015 Notes: [name], [name] ([id number]) Electronic Signature(s) Signed: 08/28/2016 4:23:48 PM By: [name] Entered By: [name] on 08/28/2016 15:33:00 [name], [name] ([id number]) -------------------------------------------------------------------------------- Pain Assessment Details Patient Name: Carrie Mulligan C. Date of Service: 08/28/2016 3:30 PM Medical Record Patient Account Number: [id number] Number: Treating RN: [name] [birth year] (81 y.o. Other Clinician: Date of Birth/Sex: Female) Treating [name], [name] Primary Care Maximillion Gill: [name] Chrishawn Kring/Extender: G Referring Tyara Dassow: [name] in Treatment: 40 Active Problems Location  of Pain Severity and Description of Pain Patient Has Paino No Site Locations With Dressing Change: No Pain Management and Medication Current Pain Management: Electronic Signature(s) Signed: 08/28/2016 4:23:48 PM By: [name] Entered By: [name] on 08/28/2016 15:16:20 [name], [name] ([id number]) -------------------------------------------------------------------------------- Patient/Caregiver Education Details Patient Name: [name] Date of Service: 08/28/2016 3:30 PM Medical Record Patient Account Number: [id number] Number: Treating RN: [name] Jul 03, 1934 (81 y.o. Other Clinician: Date of Birth/Gender: Female) Treating [name], [name] Primary Care Physician/Extender: Claudette Laws Physician: Suella [place] in Treatment: 11 Referring Physician: [name] Education Assessment Education Provided To: Patient Education Topics Provided Wound/Skin Impairment: Handouts: Other: do not get wraps wet Methods: Demonstration, Explain/Verbal Responses: State content correctly Electronic Signature(s) Signed: 08/28/2016 4:23:48 PM By: [name] Entered By: [name] on 08/28/2016 15:36:10 [name], [name] ([id number]) -------------------------------------------------------------------------------- Wound Assessment Details Patient Name: Carrie Mulligan C. Date of Service: 08/28/2016 3:30 PM Medical Record Patient Account Number: [id number] Number: Treating RN: [name] 11/06/1934 (81 y.o. Other Clinician: Date of Birth/Sex: Female) Treating [name], [place] Primary Care Alto Gandolfo: [name] Bernadette Gores/Extender: G Referring Ines Warf: [name] in Treatment: 40 Wound Status Wound Number: 10 Primary Skin Tear Etiology: Wound Location: Right Lower Leg - Lateral Wound Status: Open Wounding Event: Shear/Friction Comorbid Cataracts, Arrhythmia, Hypotension, Date Acquired: 08/20/2016 History:  Osteoarthritis Weeks Of Treatment: 1 Clustered Wound: No Photos Photo Uploaded By: [name] on 08/28/2016 16:08:38 Wound Measurements Length: (cm) 2 Width: (cm) 5.2 Depth: (cm) 0.1 Area: (cm) 8.168 Volume: (cm) 0.817 % Reduction in Area: 14% % Reduction in Volume: 14% Epithelialization: None Tunneling: No Undermining: No Wound Description Classification: Partial Thickness Foul Odor Aft Wound Margin: Distinct, outline attached Slough/Fibrin Exudate Amount: Large Exudate Type: Serosanguineous Exudate Color: red, brown er Cleansing: No o No Wound Bed Granulation Amount: Large (67-100%) Granulation Quality: Red Necrotic Amount: Small (1-33%) Standish, Ayriel C. ([id number]) Necrotic Quality: Adherent Slough Periwound Skin Texture Texture Color No Abnormalities Noted: No No Abnormalities Noted: No Moisture Temperature / Pain No Abnormalities Noted: No Temperature: No Abnormality Tenderness  on Palpation: Yes Wound Preparation Ulcer Cleansing: Rinsed/Irrigated with Saline Topical Anesthetic Applied: Other: lidocaine 4%, Treatment Notes Wound #10 (Right, Lateral Lower Leg) 1. Cleansed with: Clean wound with Normal Saline Cleanse wound with antibacterial soap and water 2. Anesthetic Topical Lidocaine 4% cream to wound bed prior to debridement 4. Dressing Applied: Promogran 5. Secondary Dressing Applied ABD Pad Dry Gauze 7. Secured with Tape Notes kerlix, coban Electronic Signature(s) Signed: 08/28/2016 4:23:48 PM By: [name] Entered By: [name] on 08/28/2016 15:28:32 [name], [name] ([id number]) -------------------------------------------------------------------------------- Wound Assessment Details Patient Name: [name], Jalyiah C. Date of Service: 08/28/2016 3:30 PM Medical Record Patient Account Number: [id number] Number: Treating RN: [name] Dec 31, 1934 (81 y.o. Other Clinician: Date of  Birth/Sex: Female) Treating [name], [place] Primary Care Jahmal Dunavant: [name] Thatiana Renbarger/Extender: G Referring Devonne Kitchen: [name] in Treatment: 40 Wound Status Wound Number: 9 Primary Venous Leg Ulcer Etiology: Wound Location: Left Lower Leg - Posterior Wound Status: Open Wounding Event: Gradually Appeared Comorbid Cataracts, Arrhythmia, Hypotension, Date Acquired: 06/26/2016 History: Osteoarthritis Weeks Of Treatment: 9 Clustered Wound: No Photos Photo Uploaded By: [name] on 08/28/2016 16:08:38 Wound Measurements Length: (cm) 1.2 Width: (cm) 0.8 Depth: (cm) 0.1 Area: (cm) 0.754 Volume: (cm) 0.075 % Reduction in Area: 87.2% % Reduction in Volume: 87.3% Epithelialization: None Tunneling: No Undermining: No Wound Description Full Thickness Without Exposed Classification: Support Structures Wound Margin: Flat and Intact Exudate Large Amount: Exudate Type: Serosanguineous Exudate Color: red, brown Foul Odor After Cleansing: No Wound Bed Granulation Amount: Large (67-100%) Exposed Structure Rodocker, Lula C. ([id number]) Granulation Quality: Red Fascia Exposed: No Necrotic Amount: Small (1-33%) Fat Layer (Subcutaneous Tissue) Exposed: No Necrotic Quality: Adherent Slough Tendon Exposed: No Muscle Exposed: No Joint Exposed: No Bone Exposed: No Limited to Skin Breakdown Periwound Skin Texture Texture Color No Abnormalities Noted: No No Abnormalities Noted: No Ecchymosis: Yes Moisture No Abnormalities Noted: No Temperature / Pain Temperature: No Abnormality Tenderness on Palpation: Yes Wound Preparation Ulcer Cleansing: Rinsed/Irrigated with Saline Topical Anesthetic Applied: Other: lidociane 4%, Treatment Notes Wound #9 (Left, Posterior Lower Leg) 1. Cleansed with: Clean wound with Normal Saline Cleanse wound with antibacterial soap and water 2. Anesthetic Topical Lidocaine 4% cream to wound bed prior to debridement 4.  Dressing Applied: Promogran 5. Secondary Dressing Applied ABD Pad Dry Gauze 7. Secured with Tape Notes kerlix, coban Electronic Signature(s) Signed: 08/28/2016 4:23:48 PM By: [name] Entered By: [name] on 08/28/2016 15:27:35 [place], [name] ([id number]) -------------------------------------------------------------------------------- Vitals Details Patient Name: Carrie Mulligan C. Date of Service: 08/28/2016 3:30 PM Medical Record Patient Account Number: [id number] Number: Treating RN: [name] 1935-05-11 (81 y.o. Other Clinician: Date of Birth/Sex: Female) Treating [name], [name] Primary Care Ivelisse [place]: [name] Cregg Jutte/Extender: G Referring Rylah Fukuda: [name] in Treatment: 40 Vital Signs Time Taken: 15:16 Temperature (F): 97.5 Height (in): 68 Pulse (bpm): 66 Weight (lbs): 178 Respiratory Rate (breaths/min): 16 Body Mass Index (BMI): 27.1 Blood Pressure (mmHg): 136/61 Reference Range: 80 - 120 mg / dl Electronic Signature(s) Signed: 08/28/2016 4:23:48 PM By: [name] Entered By: [name] on 08/28/2016 15:18:10

## 2016-08-29 NOTE — Progress Notes (Addendum)
[name], Carrie ([id number]) Visit Report for 08/28/2016 Chief Complaint Document Details Patient Name: [name], [name]. Date of Service: 08/28/2016 3:30 PM Medical Record Patient Account Number: [id number] Number: Treating RN: [name] [birth year] (82 y.o. Other Clinician: Date of Birth/Sex: Female) Treating [name], [name] Primary Care Provider: [name] Provider/Extender: G Referring Provider: [name] in Treatment: 46 Information Obtained from: Patient Chief Complaint Patient here for reevaluation of her right lower externally ulcer Electronic Signature(s) Signed: 08/28/2016 3:56:26 PM By: [name] MD Entered By: [name] on 08/28/2016 15:50:26 Humber, [name] ([id number]) -------------------------------------------------------------------------------- Debridement Details Patient Name: Carrie Mulligan C. Date of Service: 08/28/2016 3:30 PM Medical Record Patient Account Number: [id number] Number: Treating RN: [name] [birth year] (82 y.o. Other Clinician: Date of Birth/Sex: Female) Treating [name], [name] Primary Care Provider: [name] Provider/Extender: G Referring Provider: [name] in Treatment: 40 Debridement Performed for Wound #9 Left,Posterior Lower Leg Assessment: Performed By: Physician [name], MD Debridement: Debridement Pre-procedure Yes - 15:36 Verification/Time Out Taken: Start Time: 15:37 Pain Control: Lidocaine 4% Topical Solution Total Area Debrided (L x 1.2 (cm) x 0.8 (cm) = 0.96 (cm) W): Tissue and other Fibrin/Slough, Subcutaneous material debrided: Bleeding: Minimum Hemostasis Achieved: Pressure End Time: 15:38 Procedural Pain: 0 Post Procedural Pain: 0 Response to Treatment: Procedure was tolerated well Post Debridement Measurements of Total Wound Length: (cm) 1.2 Width: (cm) 0.2 Depth: (cm) 0.1 Volume: (cm) 0.019 Character of  Wound/Ulcer Post Requires Further Debridement Debridement: Severity of Tissue Post Debridement: Fat layer exposed Post Procedure Diagnosis Same as Pre-procedure Electronic Signature(s) Signed: 08/28/2016 3:56:26 PM By: [name] MD Signed: 08/28/2016 4:23:48 PM By: [name] Entered By: [name] on 08/28/2016 15:54:43 Schulenburg, [name] ([id number]) Fernicola, Birgit CMarland Kitchen ([id number]) -------------------------------------------------------------------------------- HPI Details Patient Name: Carrie Mulligan C. Date of Service: 08/28/2016 3:30 PM Medical Record Patient Account Number: [id number] Number: Treating RN: [name] 1935-04-09 (82 y.o. Other Clinician: Date of Birth/Sex: Female) Treating [name], [name] Primary Care Provider: [name] Provider/Extender: G Referring Provider: [name] in Treatment: 40 History of Present Illness HPI Description: 11/22/15; this is [name] is a 82 year old woman who lives at home on her own. According to the patient and her daughter was present she has had long-standing edema in her legs dating back many years. She also has a history of chronic systolic heart failure, atrial fibrillation and is status post mitral valve replacement. Last [facility]ogram [facility] showed an ejection fraction of 40-45% she is on Lasix 60 mg a day and spironolactone 25 mg a day. Her current problem began in December around Christmas time she developed a small hematoma in the medial part of her left leg which rapidly expanded to a very large hematoma that required surgical debridement. This situation was complicated by the fact that the patient is on long-standing Coumadin for mechanical heart valve. She went to the OR had this evacuated on January 4 /17. The wound has gradually improved however she has developed a small wounds around this area and more recently a wound on the right lateral leg. She is  weeping edema fluid. The patient is already been to see vascular surgery. It was recommended that she wear Unna boots, she did not tolerate this due to pain in the left leg. She was then prescribed Juzo stockings and really can't get these on herself although truthfully there is probably too much edema for a Juzo stockings currently. She is not a diabetic and has  no history of PAD or claudication that I could elicit. She does not use the external compression pumps reliably. She comes today with notes from her primary physician and Dr. Ronalee Belts both recommending various forms of compression but the patient does not really complied with them. Has been using the external compression pumps with some regularity but certainly not daily on the right leg and this has helped. I also note that her daughter tells me the history that she does not sleep in bed at home. She has a hospital bed but with her legs up she finds this painful so she sleeps in the couch sitting up with her legs dependent. 11/29/15; the patient is arrives today accompanied by her son. He expresses satisfaction that she is maintained the compression all week. 12/06/15; the patient has kept her Profore light wraps on, we have good edema control no major change in the wounds we have been using Aquacel 12/13/15; changed to RTD last week. One of the 3 wounds on her left medial leg is healed she has 2 remaining wounds here and one on the right lateral leg. 12/18/2015 -- the patient was at Dr. Nino Parsley office today and he was seeing her for an arterial study. While the wrap was being removed she had an inadvertent laceration of the left proximal anterior leg which bled quite profusely and a compression dressing was applied over this and the patient was here to get her Profore wraps done. I was asked to see the patient to make an assessment and treat appropriately. 12/26/15; the patient's injury on the left proximal leg and Steri-Strips removed  after soaking. There is an open area here. The original wounds to still open on the right lateral and left medial leg. 01/03/16 patient's injury on her proximal left leg looks quite good. Still small open area on the medial left leg which appears to be improving. The area on the right lateral leg still substantially open with no real improvement in wound depth. Her edema control is marginal with a Profore light. We have been using RTD for 3-4 weeks without any major change 01/10/16: wounds without s/s of infection. vascular results are pending regarding arterial studies. DORINNE, GRAEFF ([id number]) 01/17/16; patient comes in today complaining of severe pain however I think most of this is in the right hip not related to her wounds. She continues with a oval-shaped wound on the right lateral leg, trauma to the left anterior leg just below her tibial plateau. She has a smaller eschar on the left anterior leg. She is being using Prisma however she informs [place] today that she is actually allergic to silver, nose this from a previous application at [facility] some years ago 01/24/16; edema is not so well controlled today, I think I reduced her to [place] bilaterally last week. The area which was a scissor injury on her left upper anterior lower leg is fully epithelialized. She only has a small open area remaining on the medial aspect of her left leg. The oval-shaped wound on the right mid lateral leg may be a bit smaller. Debrided of surface slough nonviable subcutaneous tissue. I had changed to collagen 2 weeks ago in an attempt to get this to close 01/31/16 all the patient's wounds on her left leg give healed. We have good edema control with bilateral Profore lites which she has been compliant with. She still has the oval-shaped wound on the right lateral calf allergic even this appears to be slowly improving. The patient  has juxtalite stockings at home. She states she thinks she can put these on. She  also has external compression pumps at home although I think her compliance with this has not been good in the past. She complains today of edema in her thighs. Tells me she takes 40 mg of Lasix daily 02/21/16; only 1 small wound remains on the lateral aspect of the right calf. She is using Juzo stockings on the left leg although she complains about difficulty in applying them. She has external compression pumps at home 02/28/16; the small open wound on her right lateral calf is improved in terms of wound area. It appears that she has a wrap injury on the anterior aspect of the upper leg 03/06/16; the small open wound on her right lateral calf has a very small open area remaining. The wrap injury on the anterior aspect of the upper leg also appears better. She arrives today in clinic with a history of dyspnea with minimal exertion starting with the last 2-4 days. She does not describe chest pain. Her son and our intake nurse think she has facial swelling. She has a history of an artificial mitral valve on Coumadin 03/13/16; the small wound on the right lateral calf is no better this week. The superior wrap injury anteriorly requires debridement of surface eschar and nonviable subcutaneous tissue. She has been to her primary doctor with regards to her dyspnea we identified last week. Per the patient's description her Lasix has been increased 03/21/2016 -- patient of Dr. Dellia Nims who could not keep her appointment yesterday but has come in today with the right lower leg looking good and this is the leg which has been treated in the recent past. However, her left lower extremity is extremely swollen, tender and edematous with redness and discoloration. 03/27/16; there is only 2 small areas remain on the right leg. The edema that was so concerning last week has come down however there is extensive bruising on the lateral left leg medial left foot suggesting that she lost some blood into the leg itself. I  checked her hemoglobin on 9/21 was 14.5. Her INR was over 6. Duplex ultrasound was negative for DVT 04/03/16 at this point in time today patient is actually doing substantially better in regard to the wound on the anterior right lower extremity. currently there is no slough or eschar noted and no evidence of erythema, discharge, or local infection. She is exhibiting no signs of systemic infection. She is tolerating the dressing changes as well as the wrapping at this point in time. 04/24/16; I have not seen Mrs. Coronado for 3 weeks. Apparently she was admitted to hospital for respiratory issues/also apparently has had multiple compression fractures and had kyphoplasty. When she was last here she only had one small open area remaining on the left leg she was using juxtalite stockings. Her son is upset about restarting the Coumadin which she blames or the swelling in her legs. She is on Coumadin for prophylaxis with a chronic artificial heart valve. 04/30/16 at this point in time patient has been tolerating the 3 layer compression wrap as well as the calcium alginate. 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 With tthat being said she does have the Veldman, [place]. ([id number]) continued wound of the left lower extremity as well as the right medial lower extremity. The right side is significantly smaller compared to the left but actually is slough covered. fortunately she has no significant tenderness at rest although with  manipulation of the right location of the wound this is significantly tender. 05/07/16 for follow-up evaluation today both the patient's wounds appear to be significantly improved in size compared to last week. She is also having less pain at both locations currently. Her pain level at most is related to be a 1-2 out of 10. She does have some discharge but fortunately no evidence of infection at this point.she also continues to tolerate the compression wraps well. 05/14/16;  the patient's wound on the left leg actually looks as though it's on its way to closure. She is using collagen to this area. She has a small punched out area over the right medial calf. The cause of this is not really clear it has probably 0.4 cm of depth. She has an IV in her right hand which she states is for IV fluid when she develops low sodiums her potassiums, the etiology of this is not clear 05-21-16 she presents today with continued ulcerations the right lower extremity, posterior aspect. she has been using Iodoflex and compression therapy to the area. She denies any new injuries or trauma. She does have 2 areas proximal to this ulceration of dry crust along with an area to her left posterior lower leg that is purple discolored area appears similar to how her current ulceration started, she denies any trauma or injury to the left posterior leg we will monitor all of these areas. 05/28/16; the patient is down to 1 small open area on the right medial mid calf. This is however larger and in 50% of the surface area deeper approximately 0.4 cm. The reason for this deterioration is unclear. I've gone ahead and done a culture of this area she also tells [place] today that she is short of breath. She is also noted a swelling on her lower left eyelid 06/05/16; the patient is 1 small open area on the right medial mid calf. This looks about the same as last week. The deep area, medial 50% of the wound looks about the same. CandS I did last week was negative. The entire area looks about the same 06/12/16; not much change in this over the course of the last week. Patient comes in today complaining of extreme back pain. Apparently she has either a kyphoplasty or vertebral plasty scheduled at [facility] radiology tomorrow. For this reason no debridement today. We have been using Iodoflex 06/19/16; patient arrived with the surface eschar from last week removed with a curet debrided of subcutaneous tissue. She  tolerates this reasonably well. She is still in a skilled facility. States her vertebral plasty last week is made her pain bearable 06/26/16; patient arrived with a new wound on the posterior aspect of her left calf. She notices 3 or 4 days ago but is not certain how this happened. She states she simply felt a stinging sensation and one day on her foot and then the next day on the back of her calf. 06/28/2016 -- Ms. Reubin Milan called urgently saying that the pain in her left foot was drastically worse over the last several hours and though she removed her compression wrap she wanted to be seen before the long holiday. 07/03/16; she came in to see Dr. Con Memos on 12/29 for pain in her left foot/heel. Nothing much was found at the time and she states that the pain is better but involved her whole foot. She has a wound on the lateral aspect of the left calf and the medial aspect of the right. The  medial 1 looks as if it is just about healed however the left one still has considerable edema. 07/09/16; she arrives in clinic today with a dime-sized wound on the left posterior calf and a small linear area on the right medial calf. The area on the left was debrided with a #3 curet nonviable subcutaneous tissue, we are not making progress here. The area on the right was also debrided of surface eschar and nonviable subcutaneous tissue this has some depth to it. 07/16/16; she has a dime size wound on the left posterior calf that has some depth. This required debridement last week, although I did not feel the need to do this today. We used Iodoflex last week. Small area on the right also was not debrided. This looks like it may be close to closing 07/23/16; using Iodoflex to both wound areas. The area on the right posterior/medial calf is just about closed. She still has a fairly substantial wound on the left posterior leg which is still problematic. 07/30/16; the area on the posterior right leg is closed. The patient  will graduate to her own pressure stocking. She has a punched-out area on the left mid calf. The surface is better than last week. I did not attempt Klostermann, Rajanae C. ([id number]) debridement. Continuing Iodoflex Is complaining about pain in the right lateral ribs this is not pleuritic. She apparently had a chest x-ray done at home the immobile X x-ray. I don't have access to that report. She is not complaining of inspiratory chest pain cough etc. 08/07/16; the posterior right leg wound is closed and she is wearing a compression stocking her son expresses satisfaction with her compliance with the stocking. He continues to have a fairly substantial punched-out circular area on the left posterior calf. This wound only came about in late December, she is not really sure how it happened. 08/13/16; no major change; small punched out wound on the left posterior calf. I have reviewed vascular hx. Followed by Dr. Ronalee Belts, not felt to have PAD 08/21/16; the area on her left posterior calf still a small punched out wound. Debrided this with a #3 curet of surface necrotic material however the wound continues to clean up quite nicely and I think the dimensions overall her smaller. We have been using Promogran. UNFORTUNATELY the patient arrives today with a fairly substantial skin tear on the right anterior lateral leg that was sustained when an aide was helping her put on her compression stockings. This happened 3 days ago. We will continue Promogran to all wound areas now including the right leg again. She is asked to keep the wraps that we put on in place and we will discharge home health today at her request 08/28/16; the area on her left posterior calf is small punched out but appears to have some improvement still required debridement. The large skin tear on the right lateral leg looks healthy and is smaller no debridement is required. There is no evidence of surrounding cellulitis. Using Promogran on  both areas Electronic Signature(s) Signed: 08/28/2016 3:56:26 PM By: [name] MD Entered By: [name] on 08/28/2016 15:51:11 Christmas, [name] ([id number]) -------------------------------------------------------------------------------- Physical Exam Details Patient Name: Carrie Mulligan C. Date of Service: 08/28/2016 3:30 PM Medical Record Patient Account Number: [id number] Number: Treating RN: [name] 02/24/1935 (81 y.o. Other Clinician: Date of Birth/Sex: Female) Treating [name], [name] Primary Care Provider: [name] Provider/Extender: G Referring Provider: [name] in Treatment: 40 Constitutional Sitting or standing Blood Pressure is  within target range for patient.. Pulse regular and within target range for patient.Marland Kitchen Respirations regular, non-labored and within target range.. Temperature is normal and within the target range for the patient.. Patient's appearance is neat and clean. Appears in no acute distress. Well nourished and well developed.. Notes Wound exam; the original wound on the left posterior leg is debrided with a #3 curet removing nonviable surface material. Hemostasis with direct pressure she tolerates this reasonably well. oShe has a large skin tear on the right lateral leg although the dimensions are better here surface is healthy. I suspect this is going to take some time to resolve we identified this last week.oThere is no evidence of infection in either area Electronic Signature(s) Signed: 08/28/2016 3:56:26 PM By: [name] MD Entered By: [name] on 08/28/2016 15:52:19 Wahler, [name] ([id number]) -------------------------------------------------------------------------------- Physician Orders Details Patient Name: Carrie Mulligan C. Date of Service: 08/28/2016 3:30 PM Medical Record Patient Account Number: [id number] Number: Treating RN: [name] [birth year] (81 y.o.  Other Clinician: Date of Birth/Sex: Female) Treating [name], [name] Primary Care Provider: [name] Provider/Extender: G Referring Provider: [name] in Treatment: 80 Verbal / Phone Orders: Yes Clinician: Carolyne Fiscal, Debi Read Back and Verified: Yes Diagnosis Coding Wound Cleansing Wound #10 Right,Lateral Lower Leg o Clean wound with Normal Saline. o Cleanse wound with mild soap and water o May Shower, gently pat wound dry prior to applying new dressing. Wound #9 Left,Posterior Lower Leg o Clean wound with Normal Saline. o Cleanse wound with mild soap and water o May Shower, gently pat wound dry prior to applying new dressing. Anesthetic Wound #10 Right,Lateral Lower Leg o Topical Lidocaine 4% cream applied to wound bed prior to debridement - for clinic use Wound #9 Left,Posterior Lower Leg o Topical Lidocaine 4% cream applied to wound bed prior to debridement - for clinic use Primary Wound Dressing Wound #10 Right,Lateral Lower Leg o Promogran - plain collagen ****NO SILVER**** Wound #9 Left,Posterior Lower Leg o Promogran - plain collagen ****NO SILVER**** Secondary Dressing Wound #10 Right,Lateral Lower Leg o ABD pad o Dry Gauze o Drawtex Wound #9 Left,Posterior Lower Leg o ABD pad Lumley, Yoselyn C. ([id number]) o Dry Gauze Dressing Change Frequency Wound #10 Right,Lateral Lower Leg o Change dressing every week - only at wound care center Wound #9 Left,Posterior Lower Leg o Change dressing every week - only at wound care center Follow-up Appointments Wound #10 Right,Lateral Lower Leg o Return Appointment in 1 week. Wound #9 Left,Posterior Lower Leg o Return Appointment in 1 week. Edema Control Wound #10 Right,Lateral Lower Leg o Kerlix and Coban - Bilateral o Elevate legs to the level of the heart and pump ankles as often as possible Wound #9 Left,Posterior Lower Leg o Kerlix and Coban -  Bilateral o Elevate legs to the level of the heart and pump ankles as often as possible Additional Orders / Instructions Wound #10 Right,Lateral Lower Leg o Increase protein intake. Wound #9 Left,Posterior Lower Leg o Increase protein intake. Home Health Wound #10 Right,Lateral Lower Leg o D/C Home Health Services - only nursing Wound #9 Left,Posterior Lower Leg o D/C Home Health Services - only nursing Electronic Signature(s) Signed: 08/28/2016 3:56:26 PM By: [name] MD Signed: 08/28/2016 4:23:48 PM By: [name] Entered By: [name] on 08/28/2016 15:39:45 Devora, Tayna Loletha Grayer ([id number]) -------------------------------------------------------------------------------- Problem List Details Patient Name: AZALIAH, CARRERO C. Date of Service: 08/28/2016 3:30 PM Medical Record Patient Account Number: [id number] Number: Treating RN: [name] 05-Nov-1934 (81 y.o.  Other Clinician: Date of Birth/Sex: Female) Treating [name], [name] Primary Care Provider: [name] Provider/Extender: G Referring Provider: [name] in Treatment: 66 Active Problems ICD-10 Encounter Code Description Active Date Diagnosis L97.812 Non-pressure chronic ulcer of other part of right lower leg 04/30/2016 Yes with fat layer exposed I87.333 Chronic venous hypertension (idiopathic) with ulcer and 04/03/2016 Yes inflammation of bilateral lower extremity I89.0 Lymphedema, not elsewhere classified 04/03/2016 Yes Z73.56 Chronic systolic (congestive) heart failure 04/03/2016 Yes L97.221 Non-pressure chronic ulcer of left calf limited to 07/03/2016 Yes breakdown of skin Inactive Problems Resolved Problems ICD-10 Code Description Active Date Resolved Date S81.812S Laceration without foreign body, left lower leg, sequela 04/03/2016 04/03/2016 Electronic Signature(s) Signed: 08/28/2016 3:56:26 PM By: [name] MD Entered By: [name] on 08/28/2016  15:49:44 Valenti, [name] ([id number]) 64, [place]. ([id number]) -------------------------------------------------------------------------------- Progress Note Details Patient Name: Carrie Mulligan C. Date of Service: 08/28/2016 3:30 PM Medical Record Patient Account Number: [id number] Number: Treating RN: [name] Sep 15, 1934 (81 y.o. Other Clinician: Date of Birth/Sex: Female) Treating [name], [name] Primary Care Provider: [name] Provider/Extender: G Referring Provider: [name] in Treatment: 40 Subjective Chief Complaint Information obtained from Patient Patient here for reevaluation of her right lower externally ulcer History of Present Illness (HPI) 11/22/15; this is Hyle is a 82 year old woman who lives at home on her own. According to the patient and her daughter was present she has had long-standing edema in her legs dating back many years. She also has a history of chronic systolic heart failure, atrial fibrillation and is status post mitral valve replacement. Last echocardiogram I see in [facility] showed an ejection fraction of 40-45% she is on Lasix 60 mg a day and spironolactone 25 mg a day. Her current problem began in December around Christmas time she developed a small hematoma in the medial part of her left leg which rapidly expanded to a very large hematoma that required surgical debridement. This situation was complicated by the fact that the patient is on long-standing Coumadin for mechanical heart valve. She went to the OR had this evacuated on January 4 /17. The wound has gradually improved however she has developed a small wounds around this area and more recently a wound on the right lateral leg. She is weeping edema fluid. The patient is already been to see vascular surgery. It was recommended that she wear Unna boots, she did not tolerate this due to pain in the left leg. She was then prescribed Juzo  stockings and really can't get these on herself although truthfully there is probably too much edema for a Juzo stockings currently. She is not a diabetic and has no history of PAD or claudication that I could elicit. She does not use the external compression pumps reliably. She comes today with notes from her primary physician and Dr. Ronalee Belts both recommending various forms of compression but the patient does not really complied with them. Has been using the external compression pumps with some regularity but certainly not daily on the right leg and this has helped. I also note that her daughter tells me the history that she does not sleep in bed at home. She has a hospital bed but with her legs up she finds this painful so she sleeps in the couch sitting up with her legs dependent. 11/29/15; the patient is arrives today accompanied by her son. He expresses satisfaction that she is maintained the compression all week. 12/06/15; the patient has kept her Profore light wraps  on, we have good edema control no major change in the wounds we have been using Aquacel 12/13/15; changed to RTD last week. One of the 3 wounds on her left medial leg is healed she has 2 remaining wounds here and one on the right lateral leg. 12/18/2015 -- the patient was at Dr. Nino Parsley office today and he was seeing her for an arterial study. While the wrap was being removed she had an inadvertent laceration of the left proximal anterior leg which bled quite profusely and a compression dressing was applied over this and the patient was here to get her Profore wraps done. I was asked to see the patient to make an assessment and treat appropriately. 12/26/15; the patient's injury on the left proximal leg and Steri-Strips removed after soaking. There is an open area here. The original wounds to still open on the right lateral and left medial leg. KYE, SILVERSTEIN ([id number]) 01/03/16 patient's injury on her proximal left leg  looks quite good. Still small open area on the medial left leg which appears to be improving. The area on the right lateral leg still substantially open with no real improvement in wound depth. Her edema control is marginal with a Profore light. We have been using RTD for 3-4 weeks without any major change 01/10/16: wounds without s/s of infection. vascular results are pending regarding arterial studies. 01/17/16; patient comes in today complaining of severe pain however I think most of this is in the right hip not related to her wounds. She continues with a oval-shaped wound on the right lateral leg, trauma to the left anterior leg just below her tibial plateau. She has a smaller eschar on the left anterior leg. She is being using Prisma however she informs [place] today that she is actually allergic to silver, nose this from a previous application at [facility] some years ago 01/24/16; edema is not so well controlled today, I think I reduced her to [place] bilaterally last week. The area which was a scissor injury on her left upper anterior lower leg is fully epithelialized. She only has a small open area remaining on the medial aspect of her left leg. The oval-shaped wound on the right mid lateral leg may be a bit smaller. Debrided of surface slough nonviable subcutaneous tissue. I had changed to collagen 2 weeks ago in an attempt to get this to close 01/31/16 all the patient's wounds on her left leg give healed. We have good edema control with bilateral Profore lites which she has been compliant with. She still has the oval-shaped wound on the right lateral calf allergic even this appears to be slowly improving. The patient has juxtalite stockings at home. She states she thinks she can put these on. She also has external compression pumps at home although I think her compliance with this has not been good in the past. She complains today of edema in her thighs. Tells me she takes 40 mg of Lasix  daily 02/21/16; only 1 small wound remains on the lateral aspect of the right calf. She is using Juzo stockings on the left leg although she complains about difficulty in applying them. She has external compression pumps at home 02/28/16; the small open wound on her right lateral calf is improved in terms of wound area. It appears that she has a wrap injury on the anterior aspect of the upper leg 03/06/16; the small open wound on her right lateral calf has a very small open area remaining. The  wrap injury on the anterior aspect of the upper leg also appears better. She arrives today in clinic with a history of dyspnea with minimal exertion starting with the last 2-4 days. She does not describe chest pain. Her son and our intake nurse think she has facial swelling. She has a history of an artificial mitral valve on Coumadin 03/13/16; the small wound on the right lateral calf is no better this week. The superior wrap injury anteriorly requires debridement of surface eschar and nonviable subcutaneous tissue. She has been to her primary doctor with regards to her dyspnea we identified last week. Per the patient's description her Lasix has been increased 03/21/2016 -- patient of Dr. Dellia Nims who could not keep her appointment yesterday but has come in today with the right lower leg looking good and this is the leg which has been treated in the recent past. However, her left lower extremity is extremely swollen, tender and edematous with redness and discoloration. 03/27/16; there is only 2 small areas remain on the right leg. The edema that was so concerning last week has come down however there is extensive bruising on the lateral left leg medial left foot suggesting that she lost some blood into the leg itself. I checked her hemoglobin on 9/21 was 14.5. Her INR was over 6. Duplex ultrasound was negative for DVT 04/03/16 at this point in time today patient is actually doing substantially better in regard to  the wound on the anterior right lower extremity. currently there is no slough or eschar noted and no evidence of erythema, discharge, or local infection. She is exhibiting no signs of systemic infection. She is tolerating the dressing changes as well as the wrapping at this point in time. 04/24/16; I have not seen Mrs. Fiorenza for 3 weeks. Apparently she was admitted to hospital for respiratory issues/also apparently has had multiple compression fractures and had kyphoplasty. When she was last here she only had one small open area remaining on the left leg she was using juxtalite stockings. Her son CRICKET, GOODLIN ([id number]) is upset about restarting the Coumadin which she blames or the swelling in her legs. She is on Coumadin for prophylaxis with a chronic artificial heart valve. 04/30/16 at this point in time patient has been tolerating the 3 layer compression wrap as well as the calcium alginate. 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 With tthat being said she does have the continued wound of the left lower extremity as well as the right medial lower extremity. The right side is significantly smaller compared to the left but actually is slough covered. fortunately she has no significant tenderness at rest although with manipulation of the right location of the wound this is significantly tender. 05/07/16 for follow-up evaluation today both the patient's wounds appear to be significantly improved in size compared to last week. She is also having less pain at both locations currently. Her pain level at most is related to be a 1-2 out of 10. She does have some discharge but fortunately no evidence of infection at this point.she also continues to tolerate the compression wraps well. 05/14/16; the patient's wound on the left leg actually looks as though it's on its way to closure. She is using collagen to this area. She has a small punched out area over the right medial calf. The cause  of this is not really clear it has probably 0.4 cm of depth. She has an IV in her right hand which she states is  for IV fluid when she develops low sodiums her potassiums, the etiology of this is not clear 05-21-16 she presents today with continued ulcerations the right lower extremity, posterior aspect. she has been using Iodoflex and compression therapy to the area. She denies any new injuries or trauma. She does have 2 areas proximal to this ulceration of dry crust along with an area to her left posterior lower leg that is purple discolored area appears similar to how her current ulceration started, she denies any trauma or injury to the left posterior leg we will monitor all of these areas. 05/28/16; the patient is down to 1 small open area on the right medial mid calf. This is however larger and in 50% of the surface area deeper approximately 0.4 cm. The reason for this deterioration is unclear. I've gone ahead and done a culture of this area she also tells [place] today that she is short of breath. She is also noted a swelling on her lower left eyelid 06/05/16; the patient is 1 small open area on the right medial mid calf. This looks about the same as last week. The deep area, medial 50% of the wound looks about the same. CandS I did last week was negative. The entire area looks about the same 06/12/16; not much change in this over the course of the last week. Patient comes in today complaining of extreme back pain. Apparently she has either a kyphoplasty or vertebral plasty scheduled at [facility] radiology tomorrow. For this reason no debridement today. We have been using Iodoflex 06/19/16; patient arrived with the surface eschar from last week removed with a curet debrided of subcutaneous tissue. She tolerates this reasonably well. She is still in a skilled facility. States her vertebral plasty last week is made her pain bearable 06/26/16; patient arrived with a new wound on the posterior  aspect of her left calf. She notices 3 or 4 days ago but is not certain how this happened. She states she simply felt a stinging sensation and one day on her foot and then the next day on the back of her calf. 06/28/2016 -- Ms. Reubin Milan called urgently saying that the pain in her left foot was drastically worse over the last several hours and though she removed her compression wrap she wanted to be seen before the long holiday. 07/03/16; she came in to see Dr. Con Memos on 12/29 for pain in her left foot/heel. Nothing much was found at the time and she states that the pain is better but involved her whole foot. She has a wound on the lateral aspect of the left calf and the medial aspect of the right. The medial 1 looks as if it is just about healed however the left one still has considerable edema. 07/09/16; she arrives in clinic today with a dime-sized wound on the left posterior calf and a small linear area on the right medial calf. The area on the left was debrided with a #3 curet nonviable subcutaneous tissue, we are not making progress here. The area on the right was also debrided of surface eschar and nonviable subcutaneous tissue this has some depth to it. 07/16/16; she has a dime size wound on the left posterior calf that has some depth. This required debridement last week, although I did not feel the need to do this today. We used Iodoflex last week. Small Girouard, Beatric C. ([id number]) area on the right also was not debrided. This looks like it may be close to closing  07/23/16; using Iodoflex to both wound areas. The area on the right posterior/medial calf is just about closed. She still has a fairly substantial wound on the left posterior leg which is still problematic. 07/30/16; the area on the posterior right leg is closed. The patient will graduate to her own pressure stocking. She has a punched-out area on the left mid calf. The surface is better than last week. I did not  attempt debridement. Continuing Iodoflex Is complaining about pain in the right lateral ribs this is not pleuritic. She apparently had a chest x-ray done at home the immobile X x-ray. I don't have access to that report. She is not complaining of inspiratory chest pain cough etc. 08/07/16; the posterior right leg wound is closed and she is wearing a compression stocking her son expresses satisfaction with her compliance with the stocking. He continues to have a fairly substantial punched-out circular area on the left posterior calf. This wound only came about in late December, she is not really sure how it happened. 08/13/16; no major change; small punched out wound on the left posterior calf. I have reviewed vascular hx. Followed by Dr. Ronalee Belts, not felt to have PAD 08/21/16; the area on her left posterior calf still a small punched out wound. Debrided this with a #3 curet of surface necrotic material however the wound continues to clean up quite nicely and I think the dimensions overall her smaller. We have been using Promogran. UNFORTUNATELY the patient arrives today with a fairly substantial skin tear on the right anterior lateral leg that was sustained when an aide was helping her put on her compression stockings. This happened 3 days ago. We will continue Promogran to all wound areas now including the right leg again. She is asked to keep the wraps that we put on in place and we will discharge home health today at her request 08/28/16; the area on her left posterior calf is small punched out but appears to have some improvement still required debridement. The large skin tear on the right lateral leg looks healthy and is smaller no debridement is required. There is no evidence of surrounding cellulitis. Using Promogran on both areas Objective Constitutional Sitting or standing Blood Pressure is within target range for patient.. Pulse regular and within target range for patient.Marland Kitchen Respirations  regular, non-labored and within target range.. Temperature is normal and within the target range for the patient.. Patient's appearance is neat and clean. Appears in no acute distress. Well nourished and well developed.. Vitals Time Taken: 3:16 PM, Height: 68 in, Weight: 178 lbs, BMI: 27.1, Temperature: 97.5 F, Pulse: 66 bpm, Respiratory Rate: 16 breaths/min, Blood Pressure: 136/61 mmHg. General Notes: Wound exam; the original wound on the left posterior leg is debrided with a #3 curet removing nonviable surface material. Hemostasis with direct pressure she tolerates this reasonably well. She has a large skin tear on the right lateral leg although the dimensions are better here surface is healthy. I suspect this is going to take some time to resolve we identified this last week. There is no Tuckerman, Jimya C. ([id number]) evidence of infection in either area Integumentary (Hair, Skin) Wound #10 status is Open. Original cause of wound was Shear/Friction. The wound is located on the Right,Lateral Lower Leg. The wound measures 2cm length x 5.2cm width x 0.1cm depth; 8.168cm^2 area and 0.817cm^3 volume. There is no tunneling or undermining noted. There is a large amount of serosanguineous drainage noted. The wound margin is distinct with the outline  attached to the wound base. There is large (67-100%) red granulation within the wound bed. There is a small (1-33%) amount of necrotic tissue within the wound bed including Adherent Slough. Periwound temperature was noted as No Abnormality. The periwound has tenderness on palpation. Wound #9 status is Open. Original cause of wound was Gradually Appeared. The wound is located on the Left,Posterior Lower Leg. The wound measures 1.2cm length x 0.8cm width x 0.1cm depth; 0.754cm^2 area and 0.075cm^3 volume. The wound is limited to skin breakdown. There is no tunneling or undermining noted. There is a large amount of serosanguineous drainage noted. The  wound margin is flat and intact. There is large (67-100%) red granulation within the wound bed. There is a small (1-33%) amount of necrotic tissue within the wound bed including Adherent Slough. The periwound skin appearance exhibited: Ecchymosis. Periwound temperature was noted as No Abnormality. The periwound has tenderness on palpation. Assessment Active Problems ICD-10 L97.812 - Non-pressure chronic ulcer of other part of right lower leg with fat layer exposed I87.333 - Chronic venous hypertension (idiopathic) with ulcer and inflammation of bilateral lower extremity I89.0 - Lymphedema, not elsewhere classified J50.09 - Chronic systolic (congestive) heart failure L97.221 - Non-pressure chronic ulcer of left calf limited to breakdown of skin Procedures Wound #9 Wound #9 is a Venous Leg Ulcer located on the Left,Posterior Lower Leg . There was a Debridement ([id number]) debridement with total area of 0.96 sq cm performed by [name], MD. including Fibrin/Slough and Subcutaneous after achieving pain control using Lidocaine 4% Topical Solution. A time out was conducted at 15:36, prior to the start of the procedure. A Minimum amount of bleeding was controlled with Pressure. The procedure was tolerated well with a pain level of 0 throughout and a pain level of 0 following the procedure. Post Debridement Measurements: 1.2cm length x 0.2cm width x 0.1cm depth; 0.019cm^3 volume. Character of Wound/Ulcer Post Debridement requires further debridement. Severity of Tissue Post Salzman, Jennier C. ([id number]) Debridement is: Fat layer exposed. Post procedure Diagnosis Wound #9: Same as Pre-Procedure Plan Wound Cleansing: Wound #10 Right,Lateral Lower Leg: Clean wound with Normal Saline. Cleanse wound with mild soap and water May Shower, gently pat wound dry prior to applying new dressing. Wound #9 Left,Posterior Lower Leg: Clean wound with Normal Saline. Cleanse wound with mild soap  and water May Shower, gently pat wound dry prior to applying new dressing. Anesthetic: Wound #10 Right,Lateral Lower Leg: Topical Lidocaine 4% cream applied to wound bed prior to debridement - for clinic use Wound #9 Left,Posterior Lower Leg: Topical Lidocaine 4% cream applied to wound bed prior to debridement - for clinic use Primary Wound Dressing: Wound #10 Right,Lateral Lower Leg: Promogran - plain collagen ****NO SILVER**** Wound #9 Left,Posterior Lower Leg: Promogran - plain collagen ****NO SILVER**** Secondary Dressing: Wound #10 Right,Lateral Lower Leg: ABD pad Dry Gauze Drawtex Wound #9 Left,Posterior Lower Leg: ABD pad Dry Gauze Dressing Change Frequency: Wound #10 Right,Lateral Lower Leg: Change dressing every week - only at wound care center Wound #9 Left,Posterior Lower Leg: Change dressing every week - only at wound care center Follow-up Appointments: Wound #10 Right,Lateral Lower Leg: Return Appointment in 1 week. Wound #9 Left,Posterior Lower Leg: Return Appointment in 1 week. Edema Control: Wound #10 Right,Lateral Lower Leg: Kerlix and Coban - Bilateral Dykes, Lolamae C. ([id number]) Elevate legs to the level of the heart and pump ankles as often as possible Wound #9 Left,Posterior Lower Leg: Kerlix and Coban - Bilateral Elevate legs to the level  of the heart and pump ankles as often as possible Additional Orders / Instructions: Wound #10 Right,Lateral Lower Leg: Increase protein intake. Wound #9 Left,Posterior Lower Leg: Increase protein intake. Home Health: Wound #10 Right,Lateral Lower Leg: D/C Home Health Services - only nursing Wound #9 Left,Posterior Lower Leg: D/C Home Health Services - only nursing collagen to both wound areas kerlix and coban bilateral left in place all week c/o back pain today. will inquire next week Electronic Signature(s) Signed: 09/02/2016 4:02:34 PM By: Gretta Cool RN, BSN, Kim RN, BSN Signed: 09/04/2016 8:03:18 AM By:  [name] MD Previous Signature: 08/28/2016 3:56:26 PM Version By: [name] MD Entered By: Gretta Cool RN, BSN, Kim on 09/02/2016 16:02:34 Bauman, [name] ([id number]) -------------------------------------------------------------------------------- SuperBill Details Patient Name: JAHNYA, TRINDADE. Date of Service: 08/28/2016 Medical Record Patient Account Number: [id number] Number: Treating RN: [name] Jun 13, 1935 (81 y.o. Other Clinician: Date of Birth/Sex: Female) Treating [name], [name] Primary Care Provider: [name] Provider/Extender: G Referring Provider: [name] Service Line: Outpatient Weeks in Treatment: 40 Diagnosis Coding ICD-10 Codes Code Description [phone] Non-pressure chronic ulcer of other part of right lower leg with fat layer exposed Chronic venous hypertension (idiopathic) with ulcer and inflammation of bilateral lower I87.333 extremity I89.0 Lymphedema, not elsewhere classified O84.16 Chronic systolic (congestive) heart failure L97.221 Non-pressure chronic ulcer of left calf limited to breakdown of skin Facility Procedures CPT4: Description Modifier Quantity Code [id number] 11042 - DEB SUBQ TISSUE 20 SQ CM/< 1 ICD-10 Description Diagnosis L97.812 Non-pressure chronic ulcer of other part of right lower leg with fat layer exposed Physician Procedures CPT4: Description Modifier Quantity Code [id number] 57322 - WC PHYS SUBQ TISS 20 SQ CM 1 ICD-10 Description Diagnosis L97.812 Non-pressure chronic ulcer of other part of right lower leg with fat layer exposed Electronic Signature(s) Signed: 08/28/2016 3:56:26 PM By: [name] MD Entered By: [name] on 08/28/2016 15:55:32

## 2016-08-30 ENCOUNTER — Encounter: Payer: Self-pay | Admitting: Cardiovascular Disease

## 2016-08-30 ENCOUNTER — Ambulatory Visit (INDEPENDENT_AMBULATORY_CARE_PROVIDER_SITE_OTHER): Payer: Medicare Other | Admitting: Cardiovascular Disease

## 2016-08-30 VITALS — Ht 68.0 in | Wt 136.2 lb

## 2016-08-30 DIAGNOSIS — Z7189 Other specified counseling: Secondary | ICD-10-CM

## 2016-08-30 DIAGNOSIS — I483 Typical atrial flutter: Secondary | ICD-10-CM | POA: Diagnosis not present

## 2016-08-30 DIAGNOSIS — I480 Paroxysmal atrial fibrillation: Secondary | ICD-10-CM

## 2016-08-30 DIAGNOSIS — E782 Mixed hyperlipidemia: Secondary | ICD-10-CM | POA: Diagnosis not present

## 2016-08-30 DIAGNOSIS — Z952 Presence of prosthetic heart valve: Secondary | ICD-10-CM | POA: Diagnosis not present

## 2016-08-30 DIAGNOSIS — I5043 Acute on chronic combined systolic (congestive) and diastolic (congestive) heart failure: Secondary | ICD-10-CM

## 2016-08-30 MED ORDER — AMIODARONE HCL 200 MG PO TABS: 200.0000 mg | ORAL_TABLET | Freq: Every day | ORAL | 3 refills | Status: DC

## 2016-08-30 NOTE — Patient Instructions (Signed)
Medication Instructions:   Please stay on amiodarone one a day Monitor heart rate If it runs fast >100  You could take 2 of the amiodarone  Labwork:  No new labs needed  Testing/Procedures:  No further testing at this time   I recommend watching educational videos on topics of interest to you at:       [email or web address]  Enter code: [email or web address]    Follow-Up: It was a pleasure seeing you in the office today. Please call us if you have new issues that need to be addressed before your next appt.  [phone]  Your physician wants you to follow-up in: 6 months.  You will receive a reminder letter in the mail two months in advance. If you don't receive a letter, please call our office to schedule the follow-up appointment.  If you need a refill on your cardiac medications before your next appointment, please call your pharmacy.

## 2016-08-30 NOTE — Progress Notes (Signed)
Cardiology Office Note  Date:  08/30/2016   ID:  [name], DOB 03/30/1935, MRN [id number]  PCP:  Rusty Aus, MD   Chief Complaint  Patient presents with  . other    2 week follow up. Meds reviewed by the pt. verbally. Pt. c/o insomnia.     HPI:  [name] a pleasant 82 year old woman with history of paroxysmal atrial fibrillation, prior cardioversion April 2017 while on amiodarone, history of  mitral valve replacement with mechanical valve, redo 2016 on chronic anticoagulation, chronic renal insufficiency, long history of vertebral fractures with kyphoplasty, long period of time at [facility] rehabilitation, history of lymphedema who uses lymphedema compression pumps who presents by referral from Dr. Sabra Heck for consultation of her atrial fibrillation  She presents with her son  On her last clinic visit she was having shortness of breath, EKG concerning for atrial flutter with rapid ventricular response We started amiodarone, with follow-up today  On today's visit she appears to be in normal sinus rhythm, symptoms have resolved Chronic back pain, unsteady gait  She previously stopped amiodarone out of concern of hair loss Again today son reports she has hair loss  She has had significant weight loss over the past several months Son reports that she is somewhat depressed  History of back surgery [place] over the summer 2017: vertebrae trauma, fell backwards She underwent kyphoplasty twice, in October November 2017 with 6 total kyphoplasty's, ranging L2-L5, S1, T6, T7. She had a very bad experience on her first surgery   hospital admission April 2017 with acute diastolic and systolic CHF, Found to have atrial fibrillation with RVR  EKG on today's visit shows normal sinus rhythm with no significant ST or T-wave changes  Other past medical history reviewed Prior workup at [facility] March 2017 with echocardiogram at that time documenting ejection fraction  25% Right heart catheterization with normal filling pressures   Echo 09/2015: - Left ventricle: The cavity size was normal. There was mildconcentric hypertrophy. Systolic function was mildly tomoderately reduced. The estimated ejection fraction was in the range of 40% to 45%. Wall motion was normal; there were noregional wall motion abnormalities.  - Mitral valve: A mechanical prosthesis was present. Transvalvularvelocity was within the normal range. There was no evidence forstenosis. Peak gradient (D): 8 mm Hg. - Right ventricle: Systolic function was normal. - Tricuspid valve: There was moderate regurgitation. - Pulmonary arteries: Systolic pressure was mildly elevated. PA peak pressure: 43 mm Hg (S).    PMH:   has a past medical history of Arthritis; Chronic atrial fibrillation ([place]); Chronic back pain; Chronic combined systolic (congestive) and diastolic (congestive) heart failure; Compression fracture; GERD (gastroesophageal reflux disease); Heart murmur; Hypertension; S/P MVR (mitral valve replacement); and Shortness of breath dyspnea.  PSH:    Past Surgical History:  Procedure Laterality Date  . BREAST LUMPECTOMY Right 2005?   benign lump excision  . CARDIAC CATHETERIZATION    . [place], 2016   "MVR; MVR"  . CATARACT EXTRACTION W/PHACO Right 02/20/2015   Procedure: CATARACT EXTRACTION PHACO AND INTRAOCULAR LENS PLACEMENT (IOC);  Surgeon: Estill Cotta, MD;  Location: [facility] ORS;  Service: Ophthalmology;  Laterality: Right;  US01:31.2 AP 25.3% CDE40.13 Fluid lot # [id number]  . ELECTROPHYSIOLOGIC STUDY N/A 10/09/2015   Procedure: CARDIOVERSION;  Surgeon: Wellington Hampshire, MD;  Location: [facility] ORS;  Service: Cardiovascular;  Laterality: N/A;  . IR GENERIC HISTORICAL  04/01/2016   IR RADIOLOGIST EVAL & MGMT 04/01/2016 [facility]  .  IR GENERIC HISTORICAL  04/15/2016   IR VERTEBROPLASTY CERV/THOR BX INC UNI/BIL INC/INJECT/IMAGING 04/15/2016 Luanne Bras, MD [facility]  . IR GENERIC HISTORICAL  04/15/2016   IR VERTEBROPLASTY EA ADDL (T&LS) BX INC UNI/BIL INC INJECT/IMAGING 04/15/2016 Luanne Bras, MD [facility]  . IR GENERIC HISTORICAL  04/15/2016   IR VERTEBROPLASTY EA ADDL (T&LS) BX INC UNI/BIL INC INJECT/IMAGING 04/15/2016 Luanne Bras, MD [facility]  . IR GENERIC HISTORICAL  05/20/2016   IR RADIOLOGIST EVAL & MGMT 05/20/2016 [facility]  . IR GENERIC HISTORICAL  06/13/2016   IR VERTEBROPLASTY EA ADDL (T&LS) BX INC UNI/BIL INC INJECT/IMAGING 06/13/2016 Luanne Bras, MD [facility]  . IR GENERIC HISTORICAL  06/13/2016   IR SACROPLASTY BILATERAL 06/13/2016 Luanne Bras, MD [facility]  . IR GENERIC HISTORICAL  06/13/2016   IR VERTEBROPLASTY LUMBAR BX INC UNI/BIL INC/INJECT/IMAGING 06/13/2016 Luanne Bras, MD [facility]  . IRRIGATION AND DEBRIDEMENT HEMATOMA Left 07/05/2015   Procedure: IRRIGATION AND DEBRIDEMENT HEMATOMA;  Surgeon: Robert Bellow, MD;  Location: [facility] ORS;  Service: General;  Laterality: Left;  . pyloric stenosis  07/1937  . US ECHOCARDIOGRAPHY      Current Outpatient Prescriptions  Medication Sig Dispense Refill  . acetaminophen (TYLENOL) 650 MG CR tablet Take 650 mg by mouth every 8 (eight) hours as needed for pain.    Marland Kitchen amiodarone (PACERONE) 200 MG tablet Take 1 tablet (200 mg total) by mouth 2 (two) times daily. 70 tablet 3  . aspirin EC 81 MG tablet Take 81 mg by mouth daily.     . calcium carbonate (TUMS - DOSED IN MG ELEMENTAL CALCIUM) 500 MG chewable tablet Chew 1 tablet by mouth every 4 (four) hours as needed for indigestion or heartburn.    . Cholecalciferol (HM VITAMIN D3) 4000 units CAPS Take 1 capsule by mouth daily.    . furosemide (LASIX) 40 MG tablet Take 40 mg by mouth 2 (two) times daily.     Marland Kitchen KLOR-CON 10 10 MEQ tablet Take 2 capsules by mouth daily.    Marland Kitchen liver oil-zinc oxide (DESITIN) 40 % ointment Apply 1 application topically as needed (bed  sores).     . methocarbamol (ROBAXIN) 500 MG tablet Take 250 mg by mouth 4 (four) times daily.    . mirtazapine (REMERON) 15 MG tablet Take 15 mg by mouth at bedtime.    . pantoprazole (PROTONIX) 40 MG tablet Take 40 mg by mouth daily.     . polyethylene glycol (MIRALAX / GLYCOLAX) packet Take 17 g by mouth 2 (two) times daily. 14 each 0  . Probiotic Product (RISA-BID PROBIOTIC PO) Take 1 capsule by mouth 2 (two) times daily.    Marland Kitchen senna-docusate (SENOKOT-S) 8.6-50 MG tablet Take 1 tablet by mouth 2 (two) times daily. (Patient taking differently: Take 2 tablets by mouth 2 (two) times daily. ) 30 tablet 1  . spironolactone (ALDACTONE) 25 MG tablet Take 25 mg by mouth 2 (two) times daily.     Marland Kitchen warfarin (COUMADIN) 5 MG tablet Take 1 tablet (5 mg total) by mouth daily. 30 tablet 2   No current facility-administered medications for this visit.      Allergies:   Ace inhibitors; Hydrocodone; Mercury; Nickel; Silver; Cephalexin; Clindamycin/lincomycin; and Penicillins   Social History:  The patient  reports that she has quit smoking. Her smoking use included Cigarettes. She has a 27.00 pack-year smoking history. She has never used smokeless tobacco. She reports that she drinks alcohol. She reports that she does  not use drugs.   Family History:   family history includes Renal Disease in her father; Stroke in her mother.    Review of Systems: Review of Systems  Constitutional: Negative.   Respiratory: Negative.   Cardiovascular: Negative.   Gastrointestinal: Negative.   Musculoskeletal: Positive for back pain.       Unsteady gait  Neurological: Negative.   Psychiatric/Behavioral: Negative.   All other systems reviewed and are negative.    PHYSICAL EXAM: VS:  Ht 5\' 8"  (1.727 m)   Wt 136 lb 4 oz (61.8 kg)   BMI 20.72 kg/m  , BMI Body mass index is 20.72 kg/m. GEN: Well nourished, well developed, in no acute distress  HEENT: normal  Neck: no JVD, carotid bruits, or masses Cardiac: RRR;  no murmurs, rubs, or gallops,no edema  Respiratory:  clear to auscultation bilaterally, normal work of breathing GI: soft, nontender, nondistended, + BS MS: no deformity or atrophy , Significant kyphosis Skin: warm and dry, no rash Neuro:  Strength and sensation are intact Psych: euthymic mood, full affect    Recent Labs: 04/13/2016: B Natriuretic Peptide 283.3; TSH 2.239 06/01/2016: ALT 16 07/01/2016: BUN 38; Creatinine, Ser 1.59; Hemoglobin 11.7; Platelets 220; Potassium 5.2; Sodium 137    Lipid Panel No results found for: CHOL, HDL, LDLCALC, TRIG    Wt Readings from Last 3 Encounters:  08/30/16 136 lb 4 oz (61.8 kg)  08/14/16 138 lb 4 oz (62.7 kg)  07/22/16 143 lb 12.8 oz (65.2 kg)       ASSESSMENT AND PLAN:  Paroxysmal atrial fibrillation (HCC) - Plan: EKG 12-Lead Appears to be maintaining normal sinus rhythm We'll keep her on amiodarone 200 mg daily given history of paroxysmal atrial fibrillation and flutter  Typical atrial flutter ([place]) - Plan: EKG 12-Lead Normal sinus rhythm restored, overall feels well, no complaints  Systolic and diastolic CHF, acute on chronic (HCC) Appears relatively euvolemic on today's visit, weight stable if not trending downward in the past several months  Mixed hyperlipidemia Currently not on a statin Most recent lipid panel not available Monitored by Dr. Sabra Heck  H/O mitral valve replacement with mechanical valve Echocardiogram April 2017 with well functioning mitral valve  Encounter for anticoagulation discussion and counseling Discussion about amiodarone and warfarin interaction   Total encounter time more than 25 minutes  Greater than 50% was spent in counseling and coordination of care with the patient   Disposition:   F/U  6 months   Orders Placed This Encounter  Procedures  . EKG 12-Lead     Signed, Esmond Plants, M.D., Ph.D. 08/30/2016  [place], [place]

## 2016-09-04 ENCOUNTER — Encounter: Payer: Medicare Other | Attending: Internal Medicine | Admitting: Internal Medicine

## 2016-09-04 DIAGNOSIS — I87333 Chronic venous hypertension (idiopathic) with ulcer and inflammation of bilateral lower extremity: Secondary | ICD-10-CM | POA: Diagnosis present

## 2016-09-04 DIAGNOSIS — I890 Lymphedema, not elsewhere classified: Secondary | ICD-10-CM | POA: Insufficient documentation

## 2016-09-04 DIAGNOSIS — I5022 Chronic systolic (congestive) heart failure: Secondary | ICD-10-CM | POA: Insufficient documentation

## 2016-09-04 DIAGNOSIS — L97812 Non-pressure chronic ulcer of other part of right lower leg with fat layer exposed: Secondary | ICD-10-CM | POA: Insufficient documentation

## 2016-09-04 DIAGNOSIS — I4891 Unspecified atrial fibrillation: Secondary | ICD-10-CM | POA: Diagnosis not present

## 2016-09-04 DIAGNOSIS — Z952 Presence of prosthetic heart valve: Secondary | ICD-10-CM | POA: Insufficient documentation

## 2016-09-04 DIAGNOSIS — Z7901 Long term (current) use of anticoagulants: Secondary | ICD-10-CM | POA: Insufficient documentation

## 2016-09-04 DIAGNOSIS — L97221 Non-pressure chronic ulcer of left calf limited to breakdown of skin: Secondary | ICD-10-CM | POA: Diagnosis not present

## 2016-09-04 DIAGNOSIS — Z79899 Other long term (current) drug therapy: Secondary | ICD-10-CM | POA: Diagnosis not present

## 2016-09-06 NOTE — Progress Notes (Signed)
[name], [name] ([id number]) Visit Report for 09/04/2016 Chief Complaint Document Details Patient Name: [name], [name]. Date of Service: 09/04/2016 2:45 PM Medical Record Patient Account Number: [id number] Number: Treating RN: Cornell Barman 07/21/1934 (81 y.o. Other Clinician: Date of Birth/Sex: Female) Treating Mekel Haverstock Primary Care Provider: [name] Provider/Extender: G Referring Provider: [name] in Treatment: 41 Information Obtained from: Patient Chief Complaint Patient here for reevaluation of her right lower externally ulcer Electronic Signature(s) Signed: 09/05/2016 5:01:50 PM By: [name] MD Entered By: [name] on 09/05/2016 07:27:42 Broaddus, [name] ([id number]) -------------------------------------------------------------------------------- Debridement Details Patient Name: [name] C. Date of Service: 09/04/2016 2:45 PM Medical Record Patient Account Number: [id number] Number: Treating RN: Cornell Barman 03/17/1935 (81 y.o. Other Clinician: Date of Birth/Sex: Female) Treating Viviann Broyles Primary Care Provider: [name] Provider/Extender: G Referring Provider: [name] in Treatment: 41 Debridement Performed for Wound #10 Right,Lateral Lower Leg Assessment: Performed By: Physician [name], MD Debridement: Debridement Pre-procedure Yes - 15:40 Verification/Time Out Taken: Start Time: 15:41 Pain Control: Other : lidocaine 4% Level: Skin/Subcutaneous Tissue Total Area Debrided (L x 2 (cm) x 5.4 (cm) = 10.8 (cm) W): Tissue and other Viable, Non-Viable, Blood Clots, Eschar, Fibrin/Slough, Subcutaneous material debrided: Instrument: Curette Bleeding: Moderate Hemostasis Achieved: Pressure End Time: 15:45 Procedural Pain: 3 Post Procedural Pain: 1 Response to Treatment: Procedure was tolerated well Post Debridement Measurements of Total Wound Length: (cm)  2 Width: (cm) 5.2 Depth: (cm) 0.2 Volume: (cm) 1.634 Character of Wound/Ulcer Post Requires Further Debridement Debridement: Severity of Tissue Post Debridement: Fat layer exposed Post Procedure Diagnosis Same as Pre-procedure Electronic Signature(s) Signed: 09/05/2016 3:11:55 PM By: Gretta Cool, RN, BSN, Kim RN, BSN Signed: 09/05/2016 5:01:50 PM By: [name] MD MARIYA, MOTTLEY ([id number]) Entered By: [name] on 09/05/2016 07:27:33 Holliman, [name] ([id number]) -------------------------------------------------------------------------------- HPI Details Patient Name: [name], [name] Date of Service: 09/04/2016 2:45 PM Medical Record Patient Account Number: [id number] Number: Treating RN: Cornell Barman May 08, 1935 (81 y.o. Other Clinician: Date of Birth/Sex: Female) Treating Jakson Delpilar Primary Care Provider: [name] Provider/Extender: G Referring Provider: [name] in Treatment: 41 History of Present Illness HPI Description: 11/22/15; this is Strauch is a 82 year old woman who lives at home on her own. According to the patient and her daughter was present she has had long-standing edema in her legs dating back many years. She also has a history of chronic systolic heart failure, atrial fibrillation and is status post mitral valve replacement. Last echocardiogram I [facility] showed an ejection fraction of 40-45% she is on Lasix 60 mg a day and spironolactone 25 mg a day. Her current problem began in December around Christmas time she developed a small hematoma in the medial part of her left leg which rapidly expanded to a very large hematoma that required surgical debridement. This situation was complicated by the fact that the patient is on long-standing Coumadin for mechanical heart valve. She went to the OR had this evacuated on January 4 /17. The wound has gradually improved however she has developed a small wounds  around this area and more recently a wound on the right lateral leg. She is weeping edema fluid. The patient is already been to see vascular surgery. It was recommended that she wear Unna boots, she did not tolerate this due to pain in the left leg. She was then prescribed Juzo stockings and really can't get these on herself although truthfully there is probably too  much edema for a Juzo stockings currently. She is not a diabetic and has no history of PAD or claudication that I could elicit. She does not use the external compression pumps reliably. She comes today with notes from her primary physician and Dr. Ronalee Belts both recommending various forms of compression but the patient does not really complied with them. Has been using the external compression pumps with some regularity but certainly not daily on the right leg and this has helped. I also note that her daughter tells me the history that she does not sleep in bed at home. She has a hospital bed but with her legs up she finds this painful so she sleeps in the couch sitting up with her legs dependent. 11/29/15; the patient is arrives today accompanied by her son. He expresses satisfaction that she is maintained the compression all week. 12/06/15; the patient has kept her Profore light wraps on, we have good edema control no major change in the wounds we have been using Aquacel 12/13/15; changed to RTD last week. One of the 3 wounds on her left medial leg is healed she has 2 remaining wounds here and one on the right lateral leg. 12/18/2015 -- the patient was at Dr. Nino Parsley office today and he was seeing her for an arterial study. While the wrap was being removed she had an inadvertent laceration of the left proximal anterior leg which bled quite profusely and a compression dressing was applied over this and the patient was here to get her Profore wraps done. I was asked to see the patient to make an assessment and treat  appropriately. 12/26/15; the patient's injury on the left proximal leg and Steri-Strips removed after soaking. There is an open area here. The original wounds to still open on the right lateral and left medial leg. 01/03/16 patient's injury on her proximal left leg looks quite good. Still small open area on the medial left leg which appears to be improving. The area on the right lateral leg still substantially open with no real improvement in wound depth. Her edema control is marginal with a Profore light. We have been using RTD for 3-4 weeks without any major change 01/10/16: wounds without s/s of infection. vascular results are pending regarding arterial studies. [name], [name] ([id number]) 01/17/16; patient comes in today complaining of severe pain however I think most of this is in the right hip not related to her wounds. She continues with a oval-shaped wound on the right lateral leg, trauma to the left anterior leg just below her tibial plateau. She has a smaller eschar on the left anterior leg. She is being using Prisma however she informs [place] today that she is actually allergic to silver, nose this from a previous application at [facility] some years ago 01/24/16; edema is not so well controlled today, I think I reduced her to Marty bilaterally last week. The area which was a scissor injury on her left upper anterior lower leg is fully epithelialized. She only has a small open area remaining on the medial aspect of her left leg. The oval-shaped wound on the right mid lateral leg may be a bit smaller. Debrided of surface slough nonviable subcutaneous tissue. I had changed to collagen 2 weeks ago in an attempt to get this to close 01/31/16 all the patient's wounds on her left leg give healed. We have good edema control with bilateral Profore lites which she has been compliant with. She still has the oval-shaped wound on  the right lateral calf allergic even this appears to be slowly  improving. The patient has juxtalite stockings at home. She states she thinks she can put these on. She also has external compression pumps at home although I think her compliance with this has not been good in the past. She complains today of edema in her thighs. Tells me she takes 40 mg of Lasix daily 02/21/16; only 1 small wound remains on the lateral aspect of the right calf. She is using Juzo stockings on the left leg although she complains about difficulty in applying them. She has external compression pumps at home 02/28/16; the small open wound on her right lateral calf is improved in terms of wound area. It appears that she has a wrap injury on the anterior aspect of the upper leg 03/06/16; the small open wound on her right lateral calf has a very small open area remaining. The wrap injury on the anterior aspect of the upper leg also appears better. She arrives today in clinic with a history of dyspnea with minimal exertion starting with the last 2-4 days. She does not describe chest pain. Her son and our intake nurse think she has facial swelling. She has a history of an artificial mitral valve on Coumadin 03/13/16; the small wound on the right lateral calf is no better this week. The superior wrap injury anteriorly requires debridement of surface eschar and nonviable subcutaneous tissue. She has been to her primary doctor with regards to her dyspnea we identified last week. Per the patient's description her Lasix has been increased 03/21/2016 -- patient of Dr. Dellia Nims who could not keep her appointment yesterday but has come in today with the right lower leg looking good and this is the leg which has been treated in the recent past. However, her left lower extremity is extremely swollen, tender and edematous with redness and discoloration. 03/27/16; there is only 2 small areas remain on the right leg. The edema that was so concerning last week has come down however there is extensive  bruising on the lateral left leg medial left foot suggesting that she lost some blood into the leg itself. I checked her hemoglobin on 9/21 was 14.5. Her INR was over 6. Duplex ultrasound was negative for DVT 04/03/16 at this point in time today patient is actually doing substantially better in regard to the wound on the anterior right lower extremity. currently there is no slough or eschar noted and no evidence of erythema, discharge, or local infection. She is exhibiting no signs of systemic infection. She is tolerating the dressing changes as well as the wrapping at this point in time. 04/24/16; I have not seen Mrs. Skidmore for 3 weeks. Apparently she was admitted to hospital for respiratory issues/also apparently has had multiple compression fractures and had kyphoplasty. When she was last here she only had one small open area remaining on the left leg she was using juxtalite stockings. Her son is upset about restarting the Coumadin which she blames or the swelling in her legs. She is on Coumadin for prophylaxis with a chronic artificial heart valve. 04/30/16 at this point in time patient has been tolerating the 3 layer compression wrap as well as the calcium alginate. 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 With tthat being said she does have the Gurney, [place]ouchi. ([id number]) continued wound of the left lower extremity as well as the right medial lower extremity. The right side is significantly smaller compared to the left but  actually is slough covered. fortunately she has no significant tenderness at rest although with manipulation of the right location of the wound this is significantly tender. 05/07/16 for follow-up evaluation today both the patient's wounds appear to be significantly improved in size compared to last week. She is also having less pain at both locations currently. Her pain level at most is related to be a 1-2 out of 10. She does have some discharge but fortunately  no evidence of infection at this point.she also continues to tolerate the compression wraps well. 05/14/16; the patient's wound on the left leg actually looks as though it's on its way to closure. She is using collagen to this area. She has a small punched out area over the right medial calf. The cause of this is not really clear it has probably 0.4 cm of depth. She has an IV in her right hand which she states is for IV fluid when she develops low sodiums her potassiums, the etiology of this is not clear 05-21-16 she presents today with continued ulcerations the right lower extremity, posterior aspect. she has been using Iodoflex and compression therapy to the area. She denies any new injuries or trauma. She does have 2 areas proximal to this ulceration of dry crust along with an area to her left posterior lower leg that is purple discolored area appears similar to how her current ulceration started, she denies any trauma or injury to the left posterior leg we will monitor all of these areas. 05/28/16; the patient is down to 1 small open area on the right medial mid calf. This is however larger and in 50% of the surface area deeper approximately 0.4 cm. The reason for this deterioration is unclear. I've gone ahead and done a culture of this area she also tells [place] today that she is short of breath. She is also noted a swelling on her lower left eyelid 06/05/16; the patient is 1 small open area on the right medial mid calf. This looks about the same as last week. The deep area, medial 50% of the wound looks about the same. CandS I did last week was negative. The entire area looks about the same 06/12/16; not much change in this over the course of the last week. Patient comes in today complaining of extreme back pain. Apparently she has either a kyphoplasty or vertebral plasty scheduled at [facility] radiology tomorrow. For this reason no debridement today. We have been using Iodoflex 06/19/16;  patient arrived with the surface eschar from last week removed with a curet debrided of subcutaneous tissue. She tolerates this reasonably well. She is still in a skilled facility. States her vertebral plasty last week is made her pain bearable 06/26/16; patient arrived with a new wound on the posterior aspect of her left calf. She notices 3 or 4 days ago but is not certain how this happened. She states she simply felt a stinging sensation and one day on her foot and then the next day on the back of her calf. 06/28/2016 -- Ms. Reubin Milan called urgently saying that the pain in her left foot was drastically worse over the last several hours and though she removed her compression wrap she wanted to be seen before the long holiday. 07/03/16; she came in to see Dr. Con Memos on 12/29 for pain in her left foot/heel. Nothing much was found at the time and she states that the pain is better but involved her whole foot. She has a wound on the  lateral aspect of the left calf and the medial aspect of the right. The medial 1 looks as if it is just about healed however the left one still has considerable edema. 07/09/16; she arrives in clinic today with a dime-sized wound on the left posterior calf and a small linear area on the right medial calf. The area on the left was debrided with a #3 curet nonviable subcutaneous tissue, we are not making progress here. The area on the right was also debrided of surface eschar and nonviable subcutaneous tissue this has some depth to it. 07/16/16; she has a dime size wound on the left posterior calf that has some depth. This required debridement last week, although I did not feel the need to do this today. We used Iodoflex last week. Small area on the right also was not debrided. This looks like it may be close to closing 07/23/16; using Iodoflex to both wound areas. The area on the right posterior/medial calf is just about closed. She still has a fairly substantial wound on the  left posterior leg which is still problematic. 07/30/16; the area on the posterior right leg is closed. The patient will graduate to her own pressure stocking. She has a punched-out area on the left mid calf. The surface is better than last week. I did not attempt Templer, Annalyn C. ([id number]) debridement. Continuing Iodoflex Is complaining about pain in the right lateral ribs this is not pleuritic. She apparently had a chest x-ray done at home the immobile X x-ray. I don't have access to that report. She is not complaining of inspiratory chest pain cough etc. 08/07/16; the posterior right leg wound is closed and she is wearing a compression stocking her son expresses satisfaction with her compliance with the stocking. He continues to have a fairly substantial punched-out circular area on the left posterior calf. This wound only came about in late December, she is not really sure how it happened. 08/13/16; no major change; small punched out wound on the left posterior calf. I have reviewed vascular hx. Followed by Dr. Ronalee Belts, not felt to have PAD 08/21/16; the area on her left posterior calf still a small punched out wound. Debrided this with a #3 curet of surface necrotic material however the wound continues to clean up quite nicely and I think the dimensions overall her smaller. We have been using Promogran. UNFORTUNATELY the patient arrives today with a fairly substantial skin tear on the right anterior lateral leg that was sustained when an aide was helping her put on her compression stockings. This happened 3 days ago. We will continue Promogran to all wound areas now including the right leg again. She is asked to keep the wraps that we put on in place and we will discharge home health today at her request 08/28/16; the area on her left posterior calf is small punched out but appears to have some improvement still required debridement. The large skin tear on the right lateral leg looks  healthy and is smaller no debridement is required. There is no evidence of surrounding cellulitis. Using Promogran on both areas 09/04/16; the area on the left posterior calf continues to gradually improve however the large more recent skin tear on her right lateral leg arrived with a large necrotic surface. We have been using Promogran and that was used to avoid adherence to the right leg wound. Electronic Signature(s) Signed: 09/05/2016 5:01:50 PM By: [name] MD Entered By: [name] on 09/05/2016 07:28:46 Daza, [name] ([id number]) --------------------------------------------------------------------------------  Physical Exam Details Patient Name: [name], [name] Date of Service: 09/04/2016 2:45 PM Medical Record Patient Account Number: [id number] Number: Treating RN: Cornell Barman 1934/08/21 (81 y.o. Other Clinician: Date of Birth/Sex: Female) Treating Tambi Thole Primary Care Provider: [name] Provider/Extender: G Referring Provider: [name] in Treatment: 41 Constitutional Sitting or standing Blood Pressure is within target range for patient.. Pulse regular and within target range for patient.Marland Kitchen Respirations regular, non-labored and within target range.. Temperature is normal and within the target range for the patient.. Patient's appearance is neat and clean. Appears in no acute distress. Well nourished and well developed.. Cardiovascular Total pulses are palpable on both sides. Notes When exam; the original wound on the left leg looked healthy. Smaller and no debridement. This substantial more recent skin tear on her right anterior leg required a rather aggressive debridement with a #5 curet removing a mixture of necrotic material and adherent Promogran. The wound cleans up reasonably well and appears to have a granulated base. There is no evidence of surrounding infection Electronic Signature(s) Signed: 09/05/2016 5:01:50 PM By:  [name] MD Entered By: [name] on 09/05/2016 07:29:54 [name] ([id number]) -------------------------------------------------------------------------------- Physician Orders Details Patient Name: [name] C. Date of Service: 09/04/2016 2:45 PM Medical Record Patient Account Number: [id number] Number: Treating RN: Cornell Barman [birth year] (81 y.o. Other Clinician: Date of Birth/Sex: Female) Treating Frimet Durfee Primary Care Provider: [name] Provider/Extender: G Referring Provider: [name] in Treatment: 22 Verbal / Phone Orders: No Diagnosis Coding Wound Cleansing Wound #10 Right,Lateral Lower Leg o Clean wound with Normal Saline. o Cleanse wound with mild soap and water o May Shower, gently pat wound dry prior to applying new dressing. Wound #9 Left,Posterior Lower Leg o Clean wound with Normal Saline. o Cleanse wound with mild soap and water o May Shower, gently pat wound dry prior to applying new dressing. Anesthetic Wound #10 Right,Lateral Lower Leg o Topical Lidocaine 4% cream applied to wound bed prior to debridement - for clinic use Wound #9 Left,Posterior Lower Leg o Topical Lidocaine 4% cream applied to wound bed prior to debridement - for clinic use Primary Wound Dressing Wound #10 Right,Lateral Lower Leg o Mepitel One Contact layer o Promogran - plain collagen ****NO SILVER**** Secondary Dressing Wound #10 Right,Lateral Lower Leg o ABD pad Wound #9 Left,Posterior Lower Leg o ABD pad Dressing Change Frequency Wound #10 Right,Lateral Lower Leg o Change dressing every week - only at wound care center CHAISE, PASSARELLA ([id number]) Wound #9 Left,Posterior Lower Leg o Change dressing every week - only at wound care center Follow-up Appointments Wound #10 Right,Lateral Lower Leg o Return Appointment in 1 week. Wound #9 Left,Posterior Lower Leg o Return Appointment in  1 week. Edema Control Wound #10 Right,Lateral Lower Leg o Kerlix and Coban - Bilateral o Elevate legs to the level of the heart and pump ankles as often as possible Wound #9 Left,Posterior Lower Leg o Kerlix and Coban - Bilateral o Elevate legs to the level of the heart and pump ankles as often as possible Additional Orders / Instructions Wound #10 Right,Lateral Lower Leg o Increase protein intake. Wound #9 Left,Posterior Lower Leg o Increase protein intake. Electronic Signature(s) Signed: 09/05/2016 3:11:55 PM By: Gretta Cool RN, BSN, Kim RN, BSN Signed: 09/05/2016 5:01:50 PM By: [name] MD Entered By: Gretta Cool RN, BSN, Kim on 09/04/2016 16:12:04 [name] ([id number]) -------------------------------------------------------------------------------- Problem List Details Patient Name: [name], CRITZ. Date of Service: 09/04/2016 2:45 PM Medical  Record Patient Account Number: [id number] Number: Treating RN: Cornell Barman [birth year] (81 y.o. Other Clinician: Date of Birth/Sex: Female) Treating Frutoso Dimare Primary Care Provider: [name] Provider/Extender: G Referring Provider: [name] in Treatment: 41 Active Problems ICD-10 Encounter Code Description Active Date Diagnosis L97.812 Non-pressure chronic ulcer of other part of right lower leg 04/30/2016 Yes with fat layer exposed I87.333 Chronic venous hypertension (idiopathic) with ulcer and 04/03/2016 Yes inflammation of bilateral lower extremity I89.0 Lymphedema, not elsewhere classified 04/03/2016 Yes H29.92 Chronic systolic (congestive) heart failure 04/03/2016 Yes L97.221 Non-pressure chronic ulcer of left calf limited to 07/03/2016 Yes breakdown of skin Inactive Problems Resolved Problems ICD-10 Code Description Active Date Resolved Date S81.812S Laceration without foreign body, left lower leg, sequela 04/03/2016 04/03/2016 Electronic Signature(s) Signed: 09/05/2016 5:01:50 PM  By: [name] MD Entered By: [name] on 09/05/2016 07:26:40 Tarazon, [name] ([id number]) 35, Cale. ([id number]) -------------------------------------------------------------------------------- Progress Note Details Patient Name: [name] C. Date of Service: 09/04/2016 2:45 PM Medical Record Patient Account Number: [id number] Number: Treating RN: Cornell Barman 1935-05-03 (81 y.o. Other Clinician: Date of Birth/Sex: Female) Treating Kalyssa Anker Primary Care Provider: [name] Provider/Extender: G Referring Provider: [name] in Treatment: 41 Subjective Chief Complaint Information obtained from Patient Patient here for reevaluation of her right lower externally ulcer History of Present Illness (HPI) 11/22/15; this is Aldaz is a 82 year old woman who lives at home on her own. According to the patient and her daughter was present she has had long-standing edema in her legs dating back many years. She also has a history of chronic systolic heart failure, atrial fibrillation and is status post mitral valve replacement. Last echocardiogram I see in [facility] showed an ejection fraction of 40-45% she is on Lasix 60 mg a day and spironolactone 25 mg a day. Her current problem began in December around Christmas time she developed a small hematoma in the medial part of her left leg which rapidly expanded to a very large hematoma that required surgical debridement. This situation was complicated by the fact that the patient is on long-standing Coumadin for mechanical heart valve. She went to the OR had this evacuated on January 4 /17. The wound has gradually improved however she has developed a small wounds around this area and more recently a wound on the right lateral leg. She is weeping edema fluid. The patient is already been to see vascular surgery. It was recommended that she wear Unna boots, she did not tolerate this due to  pain in the left leg. She was then prescribed Juzo stockings and really can't get these on herself although truthfully there is probably too much edema for a Juzo stockings currently. She is not a diabetic and has no history of PAD or claudication that I could elicit. She does not use the external compression pumps reliably. She comes today with notes from her primary physician and Dr. Ronalee Belts both recommending various forms of compression but the patient does not really complied with them. Has been using the external compression pumps with some regularity but certainly not daily on the right leg and this has helped. I also note that her daughter tells me the history that she does not sleep in bed at home. She has a hospital bed but with her legs up she finds this painful so she sleeps in the couch sitting up with her legs dependent. 11/29/15; the patient is arrives today accompanied by her son. He expresses satisfaction that she is  maintained the compression all week. 12/06/15; the patient has kept her Profore light wraps on, we have good edema control no major change in the wounds we have been using Aquacel 12/13/15; changed to RTD last week. One of the 3 wounds on her left medial leg is healed she has 2 remaining wounds here and one on the right lateral leg. 12/18/2015 -- the patient was at Dr. Nino Parsley office today and he was seeing her for an arterial study. While the wrap was being removed she had an inadvertent laceration of the left proximal anterior leg which bled quite profusely and a compression dressing was applied over this and the patient was here to get her Profore wraps done. I was asked to see the patient to make an assessment and treat appropriately. 12/26/15; the patient's injury on the left proximal leg and Steri-Strips removed after soaking. There is an open area here. The original wounds to still open on the right lateral and left medial leg. [name], [name]  ([id number]) 01/03/16 patient's injury on her proximal left leg looks quite good. Still small open area on the medial left leg which appears to be improving. The area on the right lateral leg still substantially open with no real improvement in wound depth. Her edema control is marginal with a Profore light. We have been using RTD for 3-4 weeks without any major change 01/10/16: wounds without s/s of infection. vascular results are pending regarding arterial studies. 01/17/16; patient comes in today complaining of severe pain however I think most of this is in the right hip not related to her wounds. She continues with a oval-shaped wound on the right lateral leg, trauma to the left anterior leg just below her tibial plateau. She has a smaller eschar on the left anterior leg. She is being using Prisma however she informs [place] today that she is actually allergic to silver, nose this from a previous application at [facility] some years ago 01/24/16; edema is not so well controlled today, I think I reduced her to [place] bilaterally last week. The area which was a scissor injury on her left upper anterior lower leg is fully epithelialized. She only has a small open area remaining on the medial aspect of her left leg. The oval-shaped wound on the right mid lateral leg may be a bit smaller. Debrided of surface slough nonviable subcutaneous tissue. I had changed to collagen 2 weeks ago in an attempt to get this to close 01/31/16 all the patient's wounds on her left leg give healed. We have good edema control with bilateral Profore lites which she has been compliant with. She still has the oval-shaped wound on the right lateral calf allergic even this appears to be slowly improving. The patient has juxtalite stockings at home. She states she thinks she can put these on. She also has external compression pumps at home although I think her compliance with this has not been good in the past. She complains today of  edema in her thighs. Tells me she takes 40 mg of Lasix daily 02/21/16; only 1 small wound remains on the lateral aspect of the right calf. She is using Juzo stockings on the left leg although she complains about difficulty in applying them. She has external compression pumps at home 02/28/16; the small open wound on her right lateral calf is improved in terms of wound area. It appears that she has a wrap injury on the anterior aspect of the upper leg 03/06/16; the small open  wound on her right lateral calf has a very small open area remaining. The wrap injury on the anterior aspect of the upper leg also appears better. She arrives today in clinic with a history of dyspnea with minimal exertion starting with the last 2-4 days. She does not describe chest pain. Her son and our intake nurse think she has facial swelling. She has a history of an artificial mitral valve on Coumadin 03/13/16; the small wound on the right lateral calf is no better this week. The superior wrap injury anteriorly requires debridement of surface eschar and nonviable subcutaneous tissue. She has been to her primary doctor with regards to her dyspnea we identified last week. Per the patient's description her Lasix has been increased 03/21/2016 -- patient of Dr. Dellia Nims who could not keep her appointment yesterday but has come in today with the right lower leg looking good and this is the leg which has been treated in the recent past. However, her left lower extremity is extremely swollen, tender and edematous with redness and discoloration. 03/27/16; there is only 2 small areas remain on the right leg. The edema that was so concerning last week has come down however there is extensive bruising on the lateral left leg medial left foot suggesting that she lost some blood into the leg itself. I checked her hemoglobin on 9/21 was 14.5. Her INR was over 6. Duplex ultrasound was negative for DVT 04/03/16 at this point in time today  patient is actually doing substantially better in regard to the wound on the anterior right lower extremity. currently there is no slough or eschar noted and no evidence of erythema, discharge, or local infection. She is exhibiting no signs of systemic infection. She is tolerating the dressing changes as well as the wrapping at this point in time. 04/24/16; I have not seen Mrs. Rasheed for 3 weeks. Apparently she was admitted to hospital for respiratory issues/also apparently has had multiple compression fractures and had kyphoplasty. When she was last here she only had one small open area remaining on the left leg she was using juxtalite stockings. Her son [name], [name] ([id number]) is upset about restarting the Coumadin which she blames or the swelling in her legs. She is on Coumadin for prophylaxis with a chronic artificial heart valve. 04/30/16 at this point in time patient has been tolerating the 3 layer compression wrap as well as the calcium alginate. 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 With tthat being said she does have the continued wound of the left lower extremity as well as the right medial lower extremity. The right side is significantly smaller compared to the left but actually is slough covered. fortunately she has no significant tenderness at rest although with manipulation of the right location of the wound this is significantly tender. 05/07/16 for follow-up evaluation today both the patient's wounds appear to be significantly improved in size compared to last week. She is also having less pain at both locations currently. Her pain level at most is related to be a 1-2 out of 10. She does have some discharge but fortunately no evidence of infection at this point.she also continues to tolerate the compression wraps well. 05/14/16; the patient's wound on the left leg actually looks as though it's on its way to closure. She is using collagen to this area. She has a  small punched out area over the right medial calf. The cause of this is not really clear it has probably 0.4 cm  of depth. She has an IV in her right hand which she states is for IV fluid when she develops low sodiums her potassiums, the etiology of this is not clear 05-21-16 she presents today with continued ulcerations the right lower extremity, posterior aspect. she has been using Iodoflex and compression therapy to the area. She denies any new injuries or trauma. She does have 2 areas proximal to this ulceration of dry crust along with an area to her left posterior lower leg that is purple discolored area appears similar to how her current ulceration started, she denies any trauma or injury to the left posterior leg we will monitor all of these areas. 05/28/16; the patient is down to 1 small open area on the right medial mid calf. This is however larger and in 50% of the surface area deeper approximately 0.4 cm. The reason for this deterioration is unclear. I've gone ahead and done a culture of this area she also tells [place] today that she is short of breath. She is also noted a swelling on her lower left eyelid 06/05/16; the patient is 1 small open area on the right medial mid calf. This looks about the same as last week. The deep area, medial 50% of the wound looks about the same. CandS I did last week was negative. The entire area looks about the same 06/12/16; not much change in this over the course of the last week. Patient comes in today complaining of extreme back pain. Apparently she has either a kyphoplasty or vertebral plasty scheduled at [facility] radiology tomorrow. For this reason no debridement today. We have been using Iodoflex 06/19/16; patient arrived with the surface eschar from last week removed with a curet debrided of subcutaneous tissue. She tolerates this reasonably well. She is still in a skilled facility. States her vertebral plasty last week is made her pain  bearable 06/26/16; patient arrived with a new wound on the posterior aspect of her left calf. She notices 3 or 4 days ago but is not certain how this happened. She states she simply felt a stinging sensation and one day on her foot and then the next day on the back of her calf. 06/28/2016 -- Ms. Reubin Milan called urgently saying that the pain in her left foot was drastically worse over the last several hours and though she removed her compression wrap she wanted to be seen before the long holiday. 07/03/16; she came in to see Dr. Con Memos on 12/29 for pain in her left foot/heel. Nothing much was found at the time and she states that the pain is better but involved her whole foot. She has a wound on the lateral aspect of the left calf and the medial aspect of the right. The medial 1 looks as if it is just about healed however the left one still has considerable edema. 07/09/16; she arrives in clinic today with a dime-sized wound on the left posterior calf and a small linear area on the right medial calf. The area on the left was debrided with a #3 curet nonviable subcutaneous tissue, we are not making progress here. The area on the right was also debrided of surface eschar and nonviable subcutaneous tissue this has some depth to it. 07/16/16; she has a dime size wound on the left posterior calf that has some depth. This required debridement last week, although I did not feel the need to do this today. We used Iodoflex last week. Small [name], [name] ([id number]) area on the  right also was not debrided. This looks like it may be close to closing 07/23/16; using Iodoflex to both wound areas. The area on the right posterior/medial calf is just about closed. She still has a fairly substantial wound on the left posterior leg which is still problematic. 07/30/16; the area on the posterior right leg is closed. The patient will graduate to her own pressure stocking. She has a punched-out area on the left mid  calf. The surface is better than last week. I did not attempt debridement. Continuing Iodoflex Is complaining about pain in the right lateral ribs this is not pleuritic. She apparently had a chest x-ray done at home the immobile X x-ray. I don't have access to that report. She is not complaining of inspiratory chest pain cough etc. 08/07/16; the posterior right leg wound is closed and she is wearing a compression stocking her son expresses satisfaction with her compliance with the stocking. He continues to have a fairly substantial punched-out circular area on the left posterior calf. This wound only came about in late December, she is not really sure how it happened. 08/13/16; no major change; small punched out wound on the left posterior calf. I have reviewed vascular hx. Followed by Dr. Ronalee Belts, not felt to have PAD 08/21/16; the area on her left posterior calf still a small punched out wound. Debrided this with a #3 curet of surface necrotic material however the wound continues to clean up quite nicely and I think the dimensions overall her smaller. We have been using Promogran. UNFORTUNATELY the patient arrives today with a fairly substantial skin tear on the right anterior lateral leg that was sustained when an aide was helping her put on her compression stockings. This happened 3 days ago. We will continue Promogran to all wound areas now including the right leg again. She is asked to keep the wraps that we put on in place and we will discharge home health today at her request 08/28/16; the area on her left posterior calf is small punched out but appears to have some improvement still required debridement. The large skin tear on the right lateral leg looks healthy and is smaller no debridement is required. There is no evidence of surrounding cellulitis. Using Promogran on both areas 09/04/16; the area on the left posterior calf continues to gradually improve however the large more recent  skin tear on her right lateral leg arrived with a large necrotic surface. We have been using Promogran and that was used to avoid adherence to the right leg wound. Objective Constitutional Sitting or standing Blood Pressure is within target range for patient.. Pulse regular and within target range for patient.Marland Kitchen Respirations regular, non-labored and within target range.. Temperature is normal and within the target range for the patient.. Patient's appearance is neat and clean. Appears in no acute distress. Well nourished and well developed.. Vitals Time Taken: 3:16 PM, Height: 68 in, Weight: 178 lbs, BMI: 27.1, Temperature: 98.0 F, Pulse: 66 bpm, Respiratory Rate: 16 breaths/min, Blood Pressure: 131/62 mmHg. Cardiovascular Total pulses are palpable on both sides. MAHIRA, PETRAS ([id number]) General Notes: When exam; the original wound on the left leg looked healthy. Smaller and no debridement. This substantial more recent skin tear on her right anterior leg required a rather aggressive debridement with a #5 curet removing a mixture of necrotic material and adherent Promogran. The wound cleans up reasonably well and appears to have a granulated base. There is no evidence of surrounding infection Integumentary (Hair, Skin) Wound #  10 status is Open. Original cause of wound was Shear/Friction. The wound is located on the Right,Lateral Lower Leg. The wound measures 2cm length x 5.4cm width x 0.1cm depth; 8.482cm^2 area and 0.848cm^3 volume. There is a large amount of serosanguineous drainage noted. The wound margin is distinct with the outline attached to the wound base. There is large (67-100%) red granulation within the wound bed. There is a small (1-33%) amount of necrotic tissue within the wound bed including Adherent Slough. Periwound temperature was noted as No Abnormality. The periwound has tenderness on palpation. Wound #9 status is Open. Original cause of wound was Gradually  Appeared. The wound is located on the Left,Posterior Lower Leg. The wound measures 1.1cm length x 0.8cm width x 0.1cm depth; 0.691cm^2 area and 0.069cm^3 volume. The wound is limited to skin breakdown. There is a large amount of serosanguineous drainage noted. The wound margin is flat and intact. There is large (67-100%) red granulation within the wound bed. There is a small (1-33%) amount of necrotic tissue within the wound bed including Adherent Slough. The periwound skin appearance exhibited: Ecchymosis. Periwound temperature was noted as No Abnormality. The periwound has tenderness on palpation. Assessment Active Problems ICD-10 L97.812 - Non-pressure chronic ulcer of other part of right lower leg with fat layer exposed I87.333 - Chronic venous hypertension (idiopathic) with ulcer and inflammation of bilateral lower extremity I89.0 - Lymphedema, not elsewhere classified O96.29 - Chronic systolic (congestive) heart failure L97.221 - Non-pressure chronic ulcer of left calf limited to breakdown of skin Procedures Wound #10 Wound #10 is a Skin Tear located on the Right,Lateral Lower Leg . There was a Skin/Subcutaneous Tissue Debridement ([id number]) debridement with total area of 10.8 sq cm performed by [name], MD. with the following instrument(s): Curette to remove Viable and Non-Viable tissue/material including Blood Clots, Fibrin/Slough, Eschar, and Subcutaneous after achieving pain control using Other (lidocaine 4%). A time out was conducted at 15:40, prior to the start of the procedure. A Moderate amount of bleeding [name], [name] ([id number]) was controlled with Pressure. The procedure was tolerated well with a pain level of 3 throughout and a pain level of 1 following the procedure. Post Debridement Measurements: 2cm length x 5.2cm width x 0.2cm depth; 1.634cm^3 volume. Character of Wound/Ulcer Post Debridement requires further debridement. Severity of Tissue  Post Debridement is: Fat layer exposed. Post procedure Diagnosis Wound #10: Same as Pre-Procedure Plan Wound Cleansing: Wound #10 Right,Lateral Lower Leg: Clean wound with Normal Saline. Cleanse wound with mild soap and water May Shower, gently pat wound dry prior to applying new dressing. Wound #9 Left,Posterior Lower Leg: Clean wound with Normal Saline. Cleanse wound with mild soap and water May Shower, gently pat wound dry prior to applying new dressing. Anesthetic: Wound #10 Right,Lateral Lower Leg: Topical Lidocaine 4% cream applied to wound bed prior to debridement - for clinic use Wound #9 Left,Posterior Lower Leg: Topical Lidocaine 4% cream applied to wound bed prior to debridement - for clinic use Primary Wound Dressing: Wound #10 Right,Lateral Lower Leg: Mepitel One Contact layer Promogran - plain collagen ****NO SILVER**** Secondary Dressing: Wound #10 Right,Lateral Lower Leg: ABD pad Wound #9 Left,Posterior Lower Leg: ABD pad Dressing Change Frequency: Wound #10 Right,Lateral Lower Leg: Change dressing every week - only at wound care center Wound #9 Left,Posterior Lower Leg: Change dressing every week - only at wound care center Follow-up Appointments: Wound #10 Right,Lateral Lower Leg: Return Appointment in 1 week. Wound #9 Left,Posterior Lower Leg: Return Appointment in 1  week. Edema Control: Wound #10 Right,Lateral Lower Leg: Kerlix and Coban - Bilateral Beckstrand, Shaton C. ([id number]) Elevate legs to the level of the heart and pump ankles as often as possible Wound #9 Left,Posterior Lower Leg: Kerlix and Coban - Bilateral Elevate legs to the level of the heart and pump ankles as often as possible Additional Orders / Instructions: Wound #10 Right,Lateral Lower Leg: Increase protein intake. Wound #9 Left,Posterior Lower Leg: Increase protein intake. #1 continue Promogran to the left posterior leg. #2 to the traumatic skin tear on the right anterior  leg we use Mepitel, collagen, hydrogel to avoid any adherence to the actual wound bed Electronic Signature(s) Signed: 09/05/2016 5:01:50 PM By: [name] MD Entered By: [name] on 09/05/2016 07:32:32 Puzio, [name] ([id number]) -------------------------------------------------------------------------------- SuperBill Details Patient Name: [name] C. Date of Service: 09/04/2016 Medical Record Patient Account Number: [id number] Number: Treating RN: Cornell Barman 05-21-1935 (81 y.o. Other Clinician: Date of Birth/Sex: Female) Treating Anyiah Coverdale Primary Care Provider: [name] Provider/Extender: G Referring Provider: [name] Service Line: Outpatient Weeks in Treatment: 41 Diagnosis Coding ICD-10 Codes Code Description [phone] Non-pressure chronic ulcer of other part of right lower leg with fat layer exposed Chronic venous hypertension (idiopathic) with ulcer and inflammation of bilateral lower I87.333 extremity I89.0 Lymphedema, not elsewhere classified S82.70 Chronic systolic (congestive) heart failure L97.221 Non-pressure chronic ulcer of left calf limited to breakdown of skin Facility Procedures CPT4: Description Modifier Quantity Code [id number] 11042 - DEB SUBQ TISSUE 20 SQ CM/< 1 ICD-10 Description Diagnosis L97.812 Non-pressure chronic ulcer of other part of right lower leg with fat layer exposed I87.333 Chronic venous hypertension  (idiopathic) with ulcer and inflammation of bilateral lower extremity I89.0 Lymphedema, not elsewhere classified Physician Procedures CPT4: Description Modifier Quantity Code [id number] 21975 - WC PHYS SUBQ TISS 20 SQ CM 1 ICD-10 Description Diagnosis L97.812 Non-pressure chronic ulcer of other part of right lower leg with fat layer exposed I87.333 Chronic venous hypertension (idiopathic)  with ulcer and inflammation of bilateral lower extremity I89.0 Lymphedema, not elsewhere classified MAREA, REASNER  ([id number]) Electronic Signature(s) Signed: 09/05/2016 5:01:50 PM By: [name] MD Entered By: [name] on 09/05/2016 07:32:41

## 2016-09-06 NOTE — Progress Notes (Signed)
CHAYLEE, EHRSAM ([id number]) Visit Report for 09/04/2016 Arrival Information Details Patient Name: [name], [name]. Date of Service: 09/04/2016 2:45 PM Medical Record Patient Account Number: [id number] Number: Treating RN: Cornell Barman 1934/12/01 (82 y.o. Other Clinician: Date of Birth/Sex: Female) Treating [name], [name] Primary Care Zuria Fosdick: [name] Brieann Osinski/Extender: G Referring Hodari Chuba: [name] in Treatment: 41 Visit Information History Since Last Visit Added or deleted any medications: Yes Patient Arrived: [place] Any new allergies or adverse reactions: No Arrival Time: 15:15 Had a fall or experienced change in No Accompanied By: daughter activities of daily living that may affect Transfer Assistance: None risk of falls: Patient Identification Verified: Yes Signs or symptoms of abuse/neglect since last No Secondary Verification Process Yes visito Completed: Hospitalized since last visit: No Patient Requires Transmission- No Has Dressing in Place as Prescribed: Yes Based Precautions: Pain Present Now: No Patient Has Alerts: Yes Patient Alerts: Patient on Blood Thinner [place]!!! Electronic Signature(s) Signed: 09/05/2016 3:11:55 PM By: Gretta Cool, RN, BSN, Kim RN, BSN Entered By: Gretta Cool, RN, BSN, Kim on 09/04/2016 15:16:17 [place], [name] ([id number]) -------------------------------------------------------------------------------- Clinic Level of Care Assessment Details Patient Name: Carrie Mulligan C. Date of Service: 09/04/2016 2:45 PM Medical Record Patient Account Number: [id number] Number: Treating RN: Cornell Barman 24-Apr-1935 (82 y.o. Other Clinician: Date of Birth/Sex: Female) Treating [name], [place] Primary Care Erleen Egner: [name] [name] [name]/Extender: G Referring Herson Prichard: [name] in Treatment: 41 Clinic Level of Care Assessment Items TOOL 4 Quantity Score []  - Use  when only an EandM is performed on FOLLOW-UP visit 0 ASSESSMENTS - Nursing Assessment / Reassessment []  - Reassessment of Co-morbidities (includes updates in patient status) 0 X - Reassessment of Adherence to Treatment Plan 1 5 ASSESSMENTS - Wound and Skin Assessment / Reassessment X - Simple Wound Assessment / Reassessment - one wound 1 5 []  - Complex Wound Assessment / Reassessment - multiple wounds 0 []  - Dermatologic / Skin Assessment (not related to wound area) 0 ASSESSMENTS - Focused Assessment []  - Circumferential Edema Measurements - multi extremities 0 []  - Nutritional Assessment / Counseling / Intervention 0 []  - Lower Extremity Assessment (monofilament, tuning fork, pulses) 0 []  - Peripheral Arterial Disease Assessment (using hand held doppler) 0 ASSESSMENTS - Ostomy and/or Continence Assessment and Care []  - Incontinence Assessment and Management 0 []  - Ostomy Care Assessment and Management (repouching, etc.) 0 PROCESS - Coordination of Care X - Simple Patient / Family Education for ongoing care 1 15 []  - Complex (extensive) Patient / Family Education for ongoing care 0 X - Staff obtains Programmer, systems, Records, Test Results / Process Orders 1 10 []  - Staff telephones HHA, Nursing Homes / Clarify orders / etc 0 Sebastiano, Carita C. ([id number]) []  - Routine Transfer to another Facility (non-emergent condition) 0 []  - Routine Hospital Admission (non-emergent condition) 0 []  - New Admissions / Biomedical engineer / Ordering NPWT, Apligraf, etc. 0 []  - Emergency Hospital Admission (emergent condition) 0 X - Simple Discharge Coordination 1 10 []  - Complex (extensive) Discharge Coordination 0 PROCESS - Special Needs []  - Pediatric / Minor Patient Management 0 []  - Isolation Patient Management 0 []  - Hearing / Language / Visual special needs 0 []  - Assessment of Community assistance (transportation, D/C planning, etc.) 0 []  - Additional assistance / Altered mentation 0 []  -  Support Surface(s) Assessment (bed, cushion, seat, etc.) 0 INTERVENTIONS - Wound Cleansing / Measurement X - Simple Wound Cleansing - one wound 1 5 []  - Complex Wound Cleansing -  multiple wounds 0 X - Wound Imaging (photographs - any number of wounds) 1 5 []  - Wound Tracing (instead of photographs) 0 X - Simple Wound Measurement - one wound 1 5 []  - Complex Wound Measurement - multiple wounds 0 INTERVENTIONS - Wound Dressings []  - Small Wound Dressing one or multiple wounds 0 X - Medium Wound Dressing one or multiple wounds 2 15 []  - Large Wound Dressing one or multiple wounds 0 []  - Application of Medications - topical 0 []  - Application of Medications - injection 0 Kalter, Eliyanah C. ([id number]) INTERVENTIONS - Miscellaneous []  - External ear exam 0 []  - Specimen Collection (cultures, biopsies, blood, body fluids, etc.) 0 []  - Specimen(s) / Culture(s) sent or taken to Lab for analysis 0 []  - Patient Transfer (multiple staff / Civil Service fast streamer / Similar devices) 0 []  - Simple Staple / Suture removal (25 or less) 0 []  - Complex Staple / Suture removal (26 or more) 0 []  - Hypo / Hyperglycemic Management (close monitor of Blood Glucose) 0 []  - Ankle / Brachial Index (ABI) - do not check if billed separately 0 X - Vital Signs 1 5 Has the patient been seen at the hospital within the last three years: Yes Total Score: 95 Level Of Care: New/Established - Level 3 Electronic Signature(s) Signed: 09/05/2016 3:11:55 PM By: Gretta Cool, RN, BSN, Kim RN, BSN Entered By: Gretta Cool, RN, BSN, Kim on 09/04/2016 16:26:56 [place], [name] ([id number]) -------------------------------------------------------------------------------- Encounter Discharge Information Details Patient Name: Carrie Mulligan C. Date of Service: 09/04/2016 2:45 PM Medical Record Patient Account Number: [id number] Number: Treating RN: Cornell Barman 08/14/1934 (82 y.o. Other Clinician: Date of Birth/Sex: Female) Treating  [name], [name] Primary Care Rolla Kedzierski: [name] Donnielle Addison/Extender: G Referring Camry Theiss: [name] in Treatment: 41 Encounter Discharge Information Items Discharge Pain Level: 0 Discharge Condition: Stable Ambulatory Status: Ambulatory Discharge Destination: Home Transportation: Private Auto Accompanied By: daughter Schedule Follow-up Appointment: Yes Medication Reconciliation completed Yes and provided to Patient/Care Revecca Nachtigal: Patient Clinical Summary of Care: Declined Electronic Signature(s) Signed: 09/04/2016 4:13:27 PM By: Ruthine Dose Entered By: Ruthine Dose on 09/04/2016 16:13:27 [place], [name] ([id number]) -------------------------------------------------------------------------------- Lower Extremity Assessment Details Patient Name: Carrie Mulligan C. Date of Service: 09/04/2016 2:45 PM Medical Record Patient Account Number: [id number] Number: Treating RN: Cornell Barman 12/31/1934 (81 y.o. Other Clinician: Date of Birth/Sex: Female) Treating [name], [name] Primary Care Spyros Winch: [name] Keric Zehren/Extender: G Referring Deborah Lazcano: [name] in Treatment: 41 Edema Assessment Assessed: [Left: No] [Right: No] E[Left: dema] [Right: :] Calf Left: Right: Point of Measurement: 34 cm From Medial Instep 31 cm 30.5 cm Ankle Left: Right: Point of Measurement: 10 cm From Medial Instep 22 cm 21.5 cm Vascular Assessment Claudication: Claudication Assessment [Left:None] [Right:None] Pulses: Dorsalis Pedis Palpable: [Left:Yes] [Right:Yes] Posterior Tibial Extremity colors, hair growth, and conditions: Extremity Color: [Left:Mottled] [Right:Mottled] Hair Growth on Extremity: [Left:No] [Right:No] Temperature of Extremity: [Left:Warm] [Right:Warm] Capillary Refill: [Left:< 3 seconds] [Right:< 3 seconds] Dependent Rubor: [Left:Yes] [Right:Yes] Blanched when Elevated: [Left:Yes] [Right:Yes] Lipodermatosclerosis: [Left:Yes]  [Right:Yes] Toe Nail Assessment Left: Right: Thick: No No Discolored: No No Deformed: No No Improper Length and Hygiene: No No JAMESYN, MOOREFIELD ([id number]) Electronic Signature(s) Signed: 09/05/2016 3:11:55 PM By: Gretta Cool, RN, BSN, Kim RN, BSN Entered By: Gretta Cool, RN, BSN, Kim on 09/04/2016 15:36:04 [place], [name] ([id number]) -------------------------------------------------------------------------------- Multi Wound Chart Details Patient Name: Carrie Mulligan C. Date of Service: 09/04/2016 2:45 PM Medical Record Patient Account Number: [id number] Number: Treating RN: Cornell Barman 1934-10-11 (81 y.o.  Other Clinician: Date of Birth/Sex: Female) Treating [name], [name] Primary Care Alexandro Line: [name] Jackelyn Illingworth/Extender: G Referring Markasia Carrol: [name] in Treatment: 41 Vital Signs Height(in): 68 Pulse(bpm): 66 Weight(lbs): 178 Blood Pressure 131/62 (mmHg): Body Mass Index(BMI): 27 Temperature(F): 98.0 Respiratory Rate 16 (breaths/min): Photos: [N/A:N/A] Wound Location: Right Lower Leg - Lateral Left Lower Leg - Posterior N/A Wounding Event: Shear/Friction Gradually Appeared N/A Primary Etiology: Skin Tear Venous Leg Ulcer N/A Comorbid History: Cataracts, Arrhythmia, Cataracts, Arrhythmia, N/A Hypotension, Hypotension, Osteoarthritis Osteoarthritis Date Acquired: 08/20/2016 06/26/2016 N/A Weeks of Treatment: 2 10 N/A Wound Status: Open Open N/A Measurements L x W x D 2x5.4x0.1 1.1x0.8x0.1 N/A (cm) Area (cm) : 8.482 0.691 N/A Volume (cm) : 0.848 0.069 N/A % Reduction in Area: 10.70% 88.30% N/A % Reduction in Volume: 10.70% 88.30% N/A Classification: Partial Thickness Full Thickness Without N/A Exposed Support Structures Exudate Amount: Large Large N/A Exudate Type: Serosanguineous Serosanguineous N/A Exudate Color: red, brown red, brown N/A Wound Margin: Distinct, outline attached Flat and Intact N/A Mamula, Adelin C.  ([id number]) Granulation Amount: Large (67-100%) Large (67-100%) N/A Granulation Quality: Red Red N/A Necrotic Amount: Small (1-33%) Small (1-33%) N/A Epithelialization: None None N/A Debridement: Debridement ([id number]- N/A N/A 11047) Pre-procedure 15:40 N/A N/A Verification/Time Out Taken: Pain Control: Other N/A N/A Tissue Debrided: Necrotic/Eschar, N/A N/A Fibrin/Slough, Blood Clots, Subcutaneous Level: Skin/Subcutaneous N/A N/A Tissue Debridement Area (sq 10.8 N/A N/A cm): Instrument: Curette N/A N/A Bleeding: Moderate N/A N/A Hemostasis Achieved: Pressure N/A N/A Procedural Pain: 3 N/A N/A Post Procedural Pain: 1 N/A N/A Debridement Treatment Procedure was tolerated N/A N/A Response: well Post Debridement 2x5.2x0.2 N/A N/A Measurements L x W x D (cm) Post Debridement 1.634 N/A N/A Volume: (cm) Periwound Skin Texture: No Abnormalities Noted No Abnormalities Noted N/A Periwound Skin No Abnormalities Noted No Abnormalities Noted N/A Moisture: Periwound Skin Color: No Abnormalities Noted Ecchymosis: Yes N/A Temperature: No Abnormality No Abnormality N/A Tenderness on Yes Yes N/A Palpation: Wound Preparation: Ulcer Cleansing: Ulcer Cleansing: N/A Rinsed/Irrigated with Rinsed/Irrigated with Saline Saline Topical Anesthetic Topical Anesthetic Applied: Other: lidocaine Applied: Other: lidociane 4% 4% Procedures Performed: Debridement N/A N/A Treatment Notes Wound #10 (Right, Lateral Lower Leg) 1. Cleansed with: Clean wound with Normal Saline Forsberg, Aritza C. ([id number]) Cleanse wound with antibacterial soap and water 2. Anesthetic Topical Lidocaine 4% cream to wound bed prior to debridement 4. Dressing Applied: Mepitel Promogran 5. Secondary Dressing Applied ABD Pad Dry Gauze 7. Secured with Tape Notes kerlix, coban Wound #9 (Left, Posterior Lower Leg) 1. Cleansed with: Clean wound with Normal Saline Cleanse wound with antibacterial soap and  water 2. Anesthetic Topical Lidocaine 4% cream to wound bed prior to debridement 4. Dressing Applied: Mepitel Promogran 5. Secondary Dressing Applied ABD Pad Dry Gauze 7. Secured with Tape Notes kerlix, Event organiser) Signed: 09/05/2016 5:01:50 PM By: [name] MD Entered By: [name] on 09/05/2016 07:27:17 [name] ([id number]) -------------------------------------------------------------------------------- Multi-Disciplinary Care Plan Details Patient Name: NAVEENA, EYMAN. Date of Service: 09/04/2016 2:45 PM Medical Record Patient Account Number: [id number] Number: Treating RN: Cornell Barman 09-Sep-1934 (81 y.o. Other Clinician: Date of Birth/Sex: Female) Treating [name], [name] Primary Care Sky Borboa: [name] Aneira Cavitt/Extender: G Referring Chrles Selley: [name] in Treatment: 41 Active Inactive ` Orientation to the Wound Care Program Nursing Diagnoses: Knowledge deficit related to the wound healing center program Goals: Patient/caregiver will verbalize understanding of the [place] Date Initiated: 11/22/2015 Target Resolution Date: 09/28/2016 Goal Status: Active Interventions: Provide education on orientation to  the wound center Notes: ` Venous Leg Ulcer Nursing Diagnoses: Knowledge deficit related to disease process and management Potential for venous Insuffiency (use before diagnosis confirmed) Goals: Patient will maintain optimal edema control Date Initiated: 11/22/2015 Target Resolution Date: 09/28/2016 Goal Status: Active Patient/caregiver will verbalize understanding of disease process and disease management Date Initiated: 11/22/2015 Target Resolution Date: 09/28/2016 Goal Status: Active Verify adequate tissue perfusion prior to therapeutic compression application Date Initiated: 11/22/2015 Target Resolution Date: 09/28/2016 Goal Status: Active Interventions: MACYN, SHROPSHIRE ([id number]) Assess peripheral edema status every visit. Compression as ordered Provide education on venous insufficiency Treatment Activities: Non-invasive vascular studies : 11/22/2015 Therapeutic compression applied : 11/22/2015 Notes: ` Wound/Skin Impairment Nursing Diagnoses: Impaired tissue integrity Knowledge deficit related to ulceration/compromised skin integrity Goals: Patient/caregiver will verbalize understanding of skin care regimen Date Initiated: 11/22/2015 Target Resolution Date: 09/28/2016 Goal Status: Active Ulcer/skin breakdown will have a volume reduction of 30% by week 4 Date Initiated: 11/22/2015 Target Resolution Date: 09/28/2016 Goal Status: Active Ulcer/skin breakdown will have a volume reduction of 50% by week 8 Date Initiated: 11/22/2015 Target Resolution Date: 09/28/2016 Goal Status: Active Ulcer/skin breakdown will have a volume reduction of 80% by week 12 Date Initiated: 11/22/2015 Target Resolution Date: 09/28/2016 Goal Status: Active Ulcer/skin breakdown will heal within 14 weeks Date Initiated: 11/22/2015 Target Resolution Date: 09/28/2016 Goal Status: Active Interventions: Assess patient/caregiver ability to perform ulcer/skin care regimen upon admission and as needed Assess ulceration(s) every visit Provide education on ulcer and skin care Treatment Activities: Skin care regimen initiated : 11/22/2015 Topical wound management initiated : 11/22/2015 Notes: CHRISHA, VOGEL ([id number]) Electronic Signature(s) Signed: 09/05/2016 3:11:55 PM By: Gretta Cool, RN, BSN, Kim RN, BSN Entered By: Gretta Cool, RN, BSN, Kim on 09/04/2016 15:37:49 [place], [name] ([id number]) -------------------------------------------------------------------------------- Pain Assessment Details Patient Name: Carrie Mulligan C. Date of Service: 09/04/2016 2:45 PM Medical Record Patient Account Number: [id number] Number: Treating RN: Cornell Barman 31-Aug-1934 (81 y.o.  Other Clinician: Date of Birth/Sex: Female) Treating [name], [name] Primary Care Hala Narula: [name] Maresha Anastos/Extender: G Referring Tausha Milhoan: [name] in Treatment: 41 Active Problems Location of Pain Severity and Description of Pain Patient Has Paino Yes Site Locations Pain Location: Generalized Pain With Dressing Change: No Rate the pain. Current Pain Level: 8 Pain Management and Medication Current Pain Management: Goals for Pain Management Back and shoulder are hurting. Notes Topical or injectable lidocaine is offered to patient for acute pain when surgical debridement is performed. If needed, Patient is instructed to use over the counter pain medication for the following 24-48 hours after debridement. Wound care MDs do not prescribed pain medications. Patient has chronic pain or uncontrolled pain. Patient has been instructed to make an appointment with their Primary Care Physician for pain management. Electronic Signature(s) Signed: 09/05/2016 3:11:55 PM By: Gretta Cool, RN, BSN, Kim RN, BSN Entered By: Gretta Cool, RN, BSN, Kim on 09/04/2016 15:16:58 [name] ([id number]) -------------------------------------------------------------------------------- Patient/Caregiver Education Details Patient Name: SHEMICKA, COHRS. Date of Service: 09/04/2016 2:45 PM Medical Record Patient Account Number: [id number] Number: Treating RN: Cornell Barman Nov 28, 1934 (81 y.o. Other Clinician: Date of Birth/Gender: Female) Treating [name], [name] Primary Care Physician/Extender: Claudette Laws Physician: Suella [place] in Treatment: 41 Referring Physician: [name] Education Assessment Education Provided To: Patient and Caregiver Education Topics Provided Venous: Handouts: Controlling Swelling with Multilayered Compression Wraps Methods: Demonstration, Explain/Verbal Responses: State content correctly Electronic Signature(s) Signed: 09/05/2016 3:11:55 PM By: Gretta Cool,  RN, BSN, Kim RN, BSN Entered By: Gretta Cool, RN, BSN, Kim on 09/04/2016 16:13:16  GUILLERMINA, SHAFT ([id number]) -------------------------------------------------------------------------------- Wound Assessment Details Patient Name: JESSICE, MADILL. Date of Service: 09/04/2016 2:45 PM Medical Record Patient Account Number: [id number] Number: Treating RN: Cornell Barman 1935-03-09 (81 y.o. Other Clinician: Date of Birth/Sex: Female) Treating [name], [place] Primary Care Alon Mazor: [name] Keerthi Hazell/Extender: G Referring Ozie Dimaria: [name] in Treatment: 41 Wound Status Wound Number: 10 Primary Skin Tear Etiology: Wound Location: Right Lower Leg - Lateral Wound Status: Open Wounding Event: Shear/Friction Comorbid Cataracts, Arrhythmia, Hypotension, Date Acquired: 08/20/2016 History: Osteoarthritis Weeks Of Treatment: 2 Clustered Wound: No Photos Wound Measurements Length: (cm) 2 Width: (cm) 5.4 Depth: (cm) 0.1 Area: (cm) 8.482 Volume: (cm) 0.848 % Reduction in Area: 10.7% % Reduction in Volume: 10.7% Epithelialization: None Wound Description Classification: Partial Thickness Foul Odor Aft Wound Margin: Distinct, outline attached Slough/Fibrin Exudate Amount: Large Exudate Type: Serosanguineous Exudate Color: red, brown er Cleansing: No o No Wound Bed Granulation Amount: Large (67-100%) Granulation Quality: Red Necrotic Amount: Small (1-33%) Necrotic Quality: Adherent Slough Periwound Skin Texture Rauf, Precious C. ([id number]) Texture Color No Abnormalities Noted: No No Abnormalities Noted: No Moisture Temperature / Pain No Abnormalities Noted: No Temperature: No Abnormality Tenderness on Palpation: Yes Wound Preparation Ulcer Cleansing: Rinsed/Irrigated with Saline Topical Anesthetic Applied: Other: lidocaine 4%, Treatment Notes Wound #10 (Right, Lateral Lower Leg) 1. Cleansed with: Clean wound with Normal Saline Cleanse wound  with antibacterial soap and water 2. Anesthetic Topical Lidocaine 4% cream to wound bed prior to debridement 4. Dressing Applied: Mepitel Promogran 5. Secondary Dressing Applied ABD Pad Dry Gauze 7. Secured with Tape Notes kerlix, Event organiser) Signed: 09/05/2016 3:11:55 PM By: Gretta Cool, RN, BSN, Kim RN, BSN Entered By: Gretta Cool, RN, BSN, Kim on 09/04/2016 15:37:02 [place], [name] ([id number]) -------------------------------------------------------------------------------- Wound Assessment Details Patient Name: AISLYNN, CIFELLI C. Date of Service: 09/04/2016 2:45 PM Medical Record Patient Account Number: [id number] Number: Treating RN: Cornell Barman [birth year] (81 y.o. Other Clinician: Date of Birth/Sex: Female) Treating [name], [facility] Primary Care Elvan Ebron: [name] Malay Fantroy/Extender: G Referring Marquelle Musgrave: [name] in Treatment: 41 Wound Status Wound Number: 9 Primary Venous Leg Ulcer Etiology: Wound Location: Left Lower Leg - Posterior Wound Status: Open Wounding Event: Gradually Appeared Comorbid Cataracts, Arrhythmia, Hypotension, Date Acquired: 06/26/2016 History: Osteoarthritis Weeks Of Treatment: 10 Clustered Wound: No Photos Wound Measurements Length: (cm) 1.1 Width: (cm) 0.8 Depth: (cm) 0.1 Area: (cm) 0.691 Volume: (cm) 0.069 % Reduction in Area: 88.3% % Reduction in Volume: 88.3% Epithelialization: None Wound Description Full Thickness Without Exposed Foul Odor After Classification: Support Structures Wound Margin: Flat and Intact Exudate Large Amount: Exudate Type: Serosanguineous Exudate Color: red, brown Cleansing: No Wound Bed Granulation Amount: Large (67-100%) Exposed Structure Granulation Quality: Red Fascia Exposed: No Necrotic Amount: Small (1-33%) Fat Layer (Subcutaneous Tissue) Exposed: No Necrotic Quality: Adherent Slough Tendon Exposed: No Konkel, Ethell C. ([id number]) Muscle  Exposed: No Joint Exposed: No Bone Exposed: No Limited to Skin Breakdown Periwound Skin Texture Texture Color No Abnormalities Noted: No No Abnormalities Noted: No Ecchymosis: Yes Moisture No Abnormalities Noted: No Temperature / Pain Temperature: No Abnormality Tenderness on Palpation: Yes Wound Preparation Ulcer Cleansing: Rinsed/Irrigated with Saline Topical Anesthetic Applied: Other: lidociane 4%, Treatment Notes Wound #9 (Left, Posterior Lower Leg) 1. Cleansed with: Clean wound with Normal Saline Cleanse wound with antibacterial soap and water 2. Anesthetic Topical Lidocaine 4% cream to wound bed prior to debridement 4. Dressing Applied: Mepitel Promogran 5. Secondary Dressing Applied ABD Pad Dry Gauze 7. Secured with Tape Notes kerlix, Higher education careers adviser  Signature(s) Signed: 09/05/2016 3:11:55 PM By: Gretta Cool, RN, BSN, Kim RN, BSN Entered By: Gretta Cool, RN, BSN, Kim on 09/04/2016 15:37:24 [place], [name] ([id number]) -------------------------------------------------------------------------------- Vitals Details Patient Name: [name] Date of Service: 09/04/2016 2:45 PM Medical Record Patient Account Number: [id number] Number: Treating RN: Cornell Barman 15-Oct-1934 (81 y.o. Other Clinician: Date of Birth/Sex: Female) Treating [name], [name] Primary Care Tashera Montalvo: [name] Truitt Cruey/Extender: G Referring Chiyeko Ferre: [name] in Treatment: 41 Vital Signs Time Taken: 15:16 Temperature (F): 98.0 Height (in): 68 Pulse (bpm): 66 Weight (lbs): 178 Respiratory Rate (breaths/min): 16 Body Mass Index (BMI): 27.1 Blood Pressure (mmHg): 131/62 Reference Range: 80 - 120 mg / dl Electronic Signature(s) Signed: 09/05/2016 3:11:55 PM By: Gretta Cool, RN, BSN, Kim RN, BSN Entered By: Gretta Cool, RN, BSN, Kim on 09/04/2016 15:17:22

## 2016-09-11 ENCOUNTER — Encounter: Payer: Medicare Other | Admitting: Internal Medicine

## 2016-09-11 DIAGNOSIS — I87333 Chronic venous hypertension (idiopathic) with ulcer and inflammation of bilateral lower extremity: Secondary | ICD-10-CM | POA: Diagnosis not present

## 2016-09-12 NOTE — Progress Notes (Signed)
EDITH, GROLEAU ([id number]) Visit Report for 09/11/2016 Arrival Information Details Patient Name: [name], [name]. Date of Service: 09/11/2016 8:45 AM Medical Record Patient Account Number: [id number] Number: Treating RN: [name] 1935/05/17 (81 y.o. Other Clinician: Date of Birth/Sex: Female) Treating [name], [place] Primary Care Rayden Scheper: [name] Quin Mathenia/Extender: G Referring Breyana Follansbee: [name] in Treatment: 42 Visit Information History Since Last Visit All ordered tests and consults were completed: No Patient Arrived: Gilford Rile Added or deleted any medications: No Arrival Time: 08:49 Any new allergies or adverse reactions: No Accompanied By: son Had a fall or experienced change in No Transfer Assistance: EasyPivot Patient activities of daily living that may affect Lift risk of falls: Patient Identification Verified: Yes Signs or symptoms of abuse/neglect since last No Secondary Verification Process Yes visito Completed: Hospitalized since last visit: No Patient Requires Transmission- No Has Dressing in Place as Prescribed: Yes Based Precautions: Has Compression in Place as Prescribed: Yes Patient Has Alerts: Yes Pain Present Now: No Patient Alerts: Patient on Blood Thinner [place]!!! Electronic Signature(s) Signed: 09/11/2016 5:04:45 PM By: [name] Entered By: [name] on 09/11/2016 08:51:07 [name], [name] ([id number]) -------------------------------------------------------------------------------- Clinic Level of Care Assessment Details Patient Name: [name]. Date of Service: 09/11/2016 8:45 AM Medical Record Patient Account Number: [id number] Number: Treating RN: [name] 1934-08-23 (81 y.o. Other Clinician: Date of Birth/Sex: Female) Treating [name], [place] Primary Care Neldon Shepard: [name] Zayne Marovich/Extender: G Referring Glendora Clouatre:  [name] in Treatment: 42 Clinic Level of Care Assessment Items TOOL 4 Quantity Score X - Use when only an EandM is performed on FOLLOW-UP visit 1 0 ASSESSMENTS - Nursing Assessment / Reassessment X - Reassessment of Co-morbidities (includes updates in patient status) 1 10 X - Reassessment of Adherence to Treatment Plan 1 5 ASSESSMENTS - Wound and Skin Assessment / Reassessment []  - Simple Wound Assessment / Reassessment - one wound 0 X - Complex Wound Assessment / Reassessment - multiple wounds 2 5 []  - Dermatologic / Skin Assessment (not related to wound area) 0 ASSESSMENTS - Focused Assessment X - Circumferential Edema Measurements - multi extremities 2 5 []  - Nutritional Assessment / Counseling / Intervention 0 []  - Lower Extremity Assessment (monofilament, tuning fork, pulses) 0 []  - Peripheral Arterial Disease Assessment (using hand held doppler) 0 ASSESSMENTS - Ostomy and/or Continence Assessment and Care []  - Incontinence Assessment and Management 0 []  - Ostomy Care Assessment and Management (repouching, etc.) 0 PROCESS - Coordination of Care []  - Simple Patient / Family Education for ongoing care 0 X - Complex (extensive) Patient / Family Education for ongoing care 1 20 []  - Staff obtains Programmer, systems, Records, Test Results / Process Orders 0 []  - Staff telephones HHA, Nursing Homes / Clarify orders / etc 0 [name], [name] ([id number]) []  - Routine Transfer to another Facility (non-emergent condition) 0 []  - Routine Hospital Admission (non-emergent condition) 0 []  - New Admissions / Biomedical engineer / Ordering NPWT, Apligraf, etc. 0 []  - Emergency Hospital Admission (emergent condition) 0 X - Simple Discharge Coordination 1 10 []  - Complex (extensive) Discharge Coordination 0 PROCESS - Special Needs []  - Pediatric / Minor Patient Management 0 []  - Isolation Patient Management 0 []  - Hearing / Language / Visual special needs 0 []  - Assessment of Community  assistance (transportation, D/C planning, etc.) 0 []  - Additional assistance / Altered mentation 0 []  - Support Surface(s) Assessment (bed, cushion, seat, etc.) 0 INTERVENTIONS - Wound Cleansing / Measurement []  -  Simple Wound Cleansing - one wound 0 X - Complex Wound Cleansing - multiple wounds 2 5 X - Wound Imaging (photographs - any number of wounds) 1 5 []  - Wound Tracing (instead of photographs) 0 []  - Simple Wound Measurement - one wound 0 X - Complex Wound Measurement - multiple wounds 2 5 INTERVENTIONS - Wound Dressings []  - Small Wound Dressing one or multiple wounds 0 X - Medium Wound Dressing one or multiple wounds 2 15 []  - Large Wound Dressing one or multiple wounds 0 X - Application of Medications - topical 1 5 []  - Application of Medications - injection 0 Remillard, Tanna C. ([id number]) INTERVENTIONS - Miscellaneous []  - External ear exam 0 []  - Specimen Collection (cultures, biopsies, blood, body fluids, etc.) 0 []  - Specimen(s) / Culture(s) sent or taken to Lab for analysis 0 []  - Patient Transfer (multiple staff / Civil Service fast streamer / Similar devices) 0 []  - Simple Staple / Suture removal (25 or less) 0 []  - Complex Staple / Suture removal (26 or more) 0 []  - Hypo / Hyperglycemic Management (close monitor of Blood Glucose) 0 []  - Ankle / Brachial Index (ABI) - do not check if billed separately 0 X - Vital Signs 1 5 Has the patient been seen at the hospital within the last three years: Yes Total Score: 130 Level Of Care: New/Established - Level 4 Electronic Signature(s) Signed: 09/11/2016 5:04:45 PM By: [name] Entered By: [name] on 09/11/2016 11:01:31 [name], [name] ([id number]) -------------------------------------------------------------------------------- Encounter Discharge Information Details Patient Name: Carrie Mulligan C. Date of Service: 09/11/2016 8:45 AM Medical Record Patient Account Number: [id number] Number: Treating  RN: [name] 10-28-1934 (81 y.o. Other Clinician: Date of Birth/Sex: Female) Treating [name], [name] Primary Care Caileen Veracruz: [name] Tige Meas/Extender: G Referring Chantalle Defilippo: [name] in Treatment: 42 Encounter Discharge Information Items Discharge Pain Level: 0 Discharge Condition: Stable Ambulatory Status: Walker Discharge Destination: Home Transportation: Private Auto Accompanied By: son Schedule Follow-up Appointment: Yes Medication Reconciliation completed No and provided to Patient/Care Nasya Vincent: Provided on Clinical Summary of Care: 09/11/2016 Form Type Recipient Paper Patient [place] Electronic Signature(s) Signed: 09/11/2016 9:36:47 AM By: Ruthine Dose Entered By: Ruthine Dose on 09/11/2016 09:36:46 [name], [name] ([id number]) -------------------------------------------------------------------------------- Lower Extremity Assessment Details Patient Name: Carrie Mulligan C. Date of Service: 09/11/2016 8:45 AM Medical Record Patient Account Number: [id number] Number: Treating RN: [name] Aug 03, 1934 (81 y.o. Other Clinician: Date of Birth/Sex: Female) Treating [name], [place] Primary Care Delbert Darley: [name] Axzel [place]/Extender: G Referring Lorenza Shakir: [name] in Treatment: 42 Edema Assessment Assessed: [Left: No] [Right: No] E[Left: dema] [Right: :] Calf Left: Right: Point of Measurement: 34 cm From Medial Instep 29.6 cm 29.6 cm Ankle Left: Right: Point of Measurement: 10 cm From Medial Instep 20 cm 20.7 cm Vascular Assessment Pulses: Dorsalis Pedis Palpable: [Left:Yes] [Right:Yes] Posterior Tibial Extremity colors, hair growth, and conditions: Extremity Color: [Left:Mottled] [Right:Mottled] Temperature of Extremity: [Left:Warm] [Right:Warm] Capillary Refill: [Left:< 3 seconds] [Right:< 3 seconds] Notes R and L heel to knee- 48cm Electronic Signature(s) Signed: 09/11/2016 5:04:45 PM By: [name] Entered By: [name] on 09/11/2016 09:09:38 Boatman, [name] ([id number]) -------------------------------------------------------------------------------- Multi Wound Chart Details Patient Name: Carrie Mulligan C. Date of Service: 09/11/2016 8:45 AM Medical Record Patient Account Number: [id number] Number: Treating RN: [name] 09/07/1934 (81 y.o. Other Clinician: Date of Birth/Sex: Female) Treating [name], [name] Primary Care Ayomikun Starling: [name] Ramesha Poster/Extender: G Referring Liboria Putnam: [name] in Treatment: 42 Vital Signs Height(in): 68 Pulse(bpm):  62 Weight(lbs): 178 Blood Pressure 140/58 (mmHg): Body Mass Index(BMI): 27 Temperature(F): 97.4 Respiratory Rate 16 (breaths/min): Photos: [10:No Photos] [9:No Photos] [N/A:N/A] Wound Location: [10:Right Lower Leg - Lateral] [9:Left Lower Leg - Posterior] [N/A:N/A] Wounding Event: [10:Shear/Friction] [9:Gradually Appeared] [N/A:N/A] Primary Etiology: [10:Skin Tear] [9:Venous Leg Ulcer] [N/A:N/A] Comorbid History: [10:Cataracts, Arrhythmia, Hypotension, Osteoarthritis] [9:Cataracts, Arrhythmia, Hypotension, Osteoarthritis] [N/A:N/A] Date Acquired: [10:08/20/2016] [9:06/26/2016] [N/A:N/A] Weeks of Treatment: [10:3] [9:11] [N/A:N/A] Wound Status: [10:Open] [9:Open] [N/A:N/A] Measurements L x W x D 1.6x4.5x0.1 [9:0.9x0.8x0.1] [N/A:N/A] (cm) Area (cm) : [10:5.655] [9:0.565] [N/A:N/A] Volume (cm) : [10:0.565] [9:0.057] [N/A:N/A] % Reduction in Area: [10:40.50%] [9:90.40%] [N/A:N/A] % Reduction in Volume: 40.50% [9:90.30%] [N/A:N/A] Classification: [10:Partial Thickness] [9:Full Thickness Without Exposed Support Structures] [N/A:N/A] Exudate Amount: [10:Large] [9:Large] [N/A:N/A] Exudate Type: [10:Serosanguineous] [9:Serosanguineous] [N/A:N/A] Exudate Color: [10:red, brown] [9:red, brown] [N/A:N/A] Wound Margin: [10:Distinct, outline attached Flat and Intact]  [N/A:N/A] Granulation Amount: [10:Large (67-100%)] [9:Large (67-100%)] [N/A:N/A] Granulation Quality: [10:Red] [9:Red] [N/A:N/A] Necrotic Amount: [10:Small (1-33%)] [9:Small (1-33%)] [N/A:N/A] Epithelialization: [10:None] [9:None] [N/A:N/A] Periwound Skin Texture: No Abnormalities Noted No Abnormalities Noted [N/A:N/A] Periwound Skin No Abnormalities Noted No Abnormalities Noted N/A Moisture: Periwound Skin Color: No Abnormalities Noted Ecchymosis: Yes N/A Temperature: No Abnormality No Abnormality N/A Tenderness on Yes Yes N/A Palpation: Wound Preparation: Ulcer Cleansing: Ulcer Cleansing: N/A Rinsed/Irrigated with Rinsed/Irrigated with Saline Saline Topical Anesthetic Topical Anesthetic Applied: Other: lidocaine Applied: Other: lidociane 4% 4% Treatment Notes Wound #10 (Right, Lateral Lower Leg) 1. Cleansed with: Clean wound with Normal Saline Cleanse wound with antibacterial soap and water 2. Anesthetic Topical Lidocaine 4% cream to wound bed prior to debridement 4. Dressing Applied: Mepitel Promogran 5. Secondary Dressing Applied ABD Pad Dry Gauze 7. Secured with Tape Notes kerlix, coban Wound #9 (Left, Posterior Lower Leg) 1. Cleansed with: Clean wound with Normal Saline Cleanse wound with antibacterial soap and water 2. Anesthetic Topical Lidocaine 4% cream to wound bed prior to debridement 4. Dressing Applied: Mepitel Promogran 5. Secondary Dressing Applied ABD Pad Dry Gauze 7. Secured with Tape Notes [name], [name] ([id number]) Loann Quill Electronic Signature(s) Signed: 09/11/2016 4:24:49 PM By: [name] MD Entered By: [name] on 09/11/2016 09:23:13 [name], [name] ([id number]) -------------------------------------------------------------------------------- Multi-Disciplinary Care Plan Details Patient Name: ASLI, TOKARSKI. Date of Service: 09/11/2016 8:45 AM Medical Record Patient Account Number:  [id number] Number: Treating RN: [name] 02-Aug-1934 (81 y.o. Other Clinician: Date of Birth/Sex: Female) Treating [name], [name] Primary Care Sheng Pritz: [name] Alexcia [employer]/Extender: G Referring Merdith Adan: [name] in Treatment: 42 Active Inactive ` Orientation to the Wound Care Program Nursing Diagnoses: Knowledge deficit related to the wound healing center program Goals: Patient/caregiver will verbalize understanding of the [place] Date Initiated: 11/22/2015 Target Resolution Date: 09/28/2016 Goal Status: Active Interventions: Provide education on orientation to the wound center Notes: ` Venous Leg Ulcer Nursing Diagnoses: Knowledge deficit related to disease process and management Potential for venous Insuffiency (use before diagnosis confirmed) Goals: Patient will maintain optimal edema control Date Initiated: 11/22/2015 Target Resolution Date: 09/28/2016 Goal Status: Active Patient/caregiver will verbalize understanding of disease process and disease management Date Initiated: 11/22/2015 Target Resolution Date: 09/28/2016 Goal Status: Active Verify adequate tissue perfusion prior to therapeutic compression application Date Initiated: 11/22/2015 Target Resolution Date: 09/28/2016 Goal Status: Active Interventions: [name], KOZUCH ([id number]) Assess peripheral edema status every visit. Compression as ordered Provide education on venous insufficiency Treatment Activities: Non-invasive vascular studies : 11/22/2015 Therapeutic compression applied : 11/22/2015 Notes: ` Wound/Skin Impairment Nursing Diagnoses: Impaired tissue integrity Knowledge deficit related  to ulceration/compromised skin integrity Goals: Patient/caregiver will verbalize understanding of skin care regimen Date Initiated: 11/22/2015 Target Resolution Date: 09/28/2016 Goal Status: Active Ulcer/skin breakdown will have a volume reduction of 30% by  week 4 Date Initiated: 11/22/2015 Target Resolution Date: 09/28/2016 Goal Status: Active Ulcer/skin breakdown will have a volume reduction of 50% by week 8 Date Initiated: 11/22/2015 Target Resolution Date: 09/28/2016 Goal Status: Active Ulcer/skin breakdown will have a volume reduction of 80% by week 12 Date Initiated: 11/22/2015 Target Resolution Date: 09/28/2016 Goal Status: Active Ulcer/skin breakdown will heal within 14 weeks Date Initiated: 11/22/2015 Target Resolution Date: 09/28/2016 Goal Status: Active Interventions: Assess patient/caregiver ability to perform ulcer/skin care regimen upon admission and as needed Assess ulceration(s) every visit Provide education on ulcer and skin care Treatment Activities: Skin care regimen initiated : 11/22/2015 Topical wound management initiated : 11/22/2015 Notes: [name], [name] ([id number]) Electronic Signature(s) Signed: 09/11/2016 5:04:45 PM By: [name] Entered By: [name] on 09/11/2016 09:09:43 Spang, Alletta Loletha [name] ([id number]) -------------------------------------------------------------------------------- Pain Assessment Details Patient Name: Carrie Mulligan C. Date of Service: 09/11/2016 8:45 AM Medical Record Patient Account Number: [id number] Number: Treating RN: [name] Nov 28, 1934 (81 y.o. Other Clinician: Date of Birth/Sex: Female) Treating [name], [name] Primary Care Jorene Kaylor: [name] Naquisha Whitehair/Extender: G Referring Pura Picinich: [name] in Treatment: 42 Active Problems Location of Pain Severity and Description of Pain Patient Has Paino No Site Locations With Dressing Change: No Pain Management and Medication Current Pain Management: Electronic Signature(s) Signed: 09/11/2016 5:04:45 PM By: [name] Entered By: [name] on 09/11/2016 08:52:25 Kapuscinski, [name]  ([id number]) -------------------------------------------------------------------------------- Patient/Caregiver Education Details Patient Name: [name]. Date of Service: 09/11/2016 8:45 AM Medical Record Patient Account Number: [id number] Number: Treating RN: [name] 1934/08/02 (81 y.o. Other Clinician: Date of Birth/Gender: Female) Treating [name], [name] Primary Care Physician/Extender: Claudette Laws Physician: Suella [place] in Treatment: 42 Referring Physician: [name] Education Assessment Education Provided To: Patient Education Topics Provided Wound/Skin Impairment: Handouts: Other: change dressing as ordered Methods: Demonstration, Explain/Verbal Responses: State content correctly Electronic Signature(s) Signed: 09/11/2016 5:04:45 PM By: [name] Entered By: [name] on 09/11/2016 09:17:14 [name], [name] ([id number]) -------------------------------------------------------------------------------- Wound Assessment Details Patient Name: Carrie Mulligan C. Date of Service: 09/11/2016 8:45 AM Medical Record Patient Account Number: [id number] Number: Treating RN: [name] 1935/04/11 (81 y.o. Other Clinician: Date of Birth/Sex: Female) Treating [name], [place] Primary Care Jeremiah Curci: [name] [name] Estupinan/Extender: G Referring Paeton Latouche: [name] in Treatment: 42 Wound Status Wound Number: 10 Primary Skin Tear Etiology: Wound Location: Right Lower Leg - Lateral Wound Status: Open Wounding Event: Shear/Friction Comorbid Cataracts, Arrhythmia, Hypotension, Date Acquired: 08/20/2016 History: Osteoarthritis Weeks Of Treatment: 3 Clustered Wound: No Photos Photo Uploaded By: [name] on 09/11/2016 11:50:41 Wound Measurements Length: (cm) 1.6 Width: (cm) 4.5 Depth: (cm) 0.1 Area: (cm) 5.655 Volume: (cm) 0.565 % Reduction in Area: 40.5% % Reduction in Volume:  40.5% Epithelialization: None Tunneling: No Undermining: No Wound Description Classification: Partial Thickness Foul Odor Aft Wound Margin: Distinct, outline attached Slough/Fibrin Exudate Amount: Large Exudate Type: Serosanguineous Exudate Color: red, brown er Cleansing: No o No Wound Bed Granulation Amount: Large (67-100%) Granulation Quality: Red Necrotic Amount: Small (1-33%) Hipp, Kobe C. ([id number]) Necrotic Quality: Adherent Slough Periwound Skin Texture Texture Color No Abnormalities Noted: No No Abnormalities Noted: No Moisture Temperature / Pain No Abnormalities Noted: No Temperature: No Abnormality Tenderness on Palpation: Yes Wound Preparation Ulcer Cleansing: Rinsed/Irrigated with Saline Topical Anesthetic Applied: Other: lidocaine 4%, Treatment Notes Wound #  10 (Right, Lateral Lower Leg) 1. Cleansed with: Clean wound with Normal Saline Cleanse wound with antibacterial soap and water 2. Anesthetic Topical Lidocaine 4% cream to wound bed prior to debridement 4. Dressing Applied: Mepitel Promogran 5. Secondary Dressing Applied ABD Pad Dry Gauze 7. Secured with Tape Notes kerlix, coban Electronic Signature(s) Signed: 09/11/2016 5:04:45 PM By: [name] Entered By: [name] on 09/11/2016 09:05:52 Wooley, [name] ([id number]) -------------------------------------------------------------------------------- Wound Assessment Details Patient Name: [name], Philomina C. Date of Service: 09/11/2016 8:45 AM Medical Record Patient Account Number: [id number] Number: Treating RN: [name] 01/17/1935 (81 y.o. Other Clinician: Date of Birth/Sex: Female) Treating [name], [place] Primary Care Tauheed Mcfayden: [name] Roen [place]/Extender: G Referring Tekisha Darcey: [name] in Treatment: 42 Wound Status Wound Number: 9 Primary Venous Leg Ulcer Etiology: Wound Location: Left Lower Leg - Posterior Wound Status:  Open Wounding Event: Gradually Appeared Comorbid Cataracts, Arrhythmia, Hypotension, Date Acquired: 06/26/2016 History: Osteoarthritis Weeks Of Treatment: 11 Clustered Wound: No Photos Photo Uploaded By: [name] on 09/11/2016 11:50:42 Wound Measurements Length: (cm) 0.9 Width: (cm) 0.8 Depth: (cm) 0.1 Area: (cm) 0.565 Volume: (cm) 0.057 % Reduction in Area: 90.4% % Reduction in Volume: 90.3% Epithelialization: None Tunneling: No Undermining: No Wound Description Full Thickness Without Exposed Classification: Support Structures Wound Margin: Flat and Intact Exudate Large Amount: Exudate Type: Serosanguineous Exudate Color: red, brown Foul Odor After Cleansing: No Wound Bed Granulation Amount: Large (67-100%) Exposed Structure Corniel, Lakrisha C. ([id number]) Granulation Quality: Red Fascia Exposed: No Necrotic Amount: Small (1-33%) Fat Layer (Subcutaneous Tissue) Exposed: No Necrotic Quality: Adherent Slough Tendon Exposed: No Muscle Exposed: No Joint Exposed: No Bone Exposed: No Limited to Skin Breakdown Periwound Skin Texture Texture Color No Abnormalities Noted: No No Abnormalities Noted: No Ecchymosis: Yes Moisture No Abnormalities Noted: No Temperature / Pain Temperature: No Abnormality Tenderness on Palpation: Yes Wound Preparation Ulcer Cleansing: Rinsed/Irrigated with Saline Topical Anesthetic Applied: Other: lidociane 4%, Treatment Notes Wound #9 (Left, Posterior Lower Leg) 1. Cleansed with: Clean wound with Normal Saline Cleanse wound with antibacterial soap and water 2. Anesthetic Topical Lidocaine 4% cream to wound bed prior to debridement 4. Dressing Applied: Mepitel Promogran 5. Secondary Dressing Applied ABD Pad Dry Gauze 7. Secured with Tape Notes kerlix, coban Electronic Signature(s) Signed: 09/11/2016 5:04:45 PM By: [name] Entered By: [name] on 09/11/2016 09:06:26 Sultan, [name]  ([id number]) -------------------------------------------------------------------------------- Vitals Details Patient Name: Carrie Mulligan C. Date of Service: 09/11/2016 8:45 AM Medical Record Patient Account Number: [id number] Number: Treating RN: [name] May 24, 1935 (81 y.o. Other Clinician: Date of Birth/Sex: Female) Treating [name], [name] Primary Care Mareli Antunes: [name] Shanica Castellanos/Extender: G Referring Jobeth Pangilinan: [name] in Treatment: 42 Vital Signs Time Taken: 08:52 Temperature (F): 97.4 Height (in): 68 Pulse (bpm): 62 Weight (lbs): 178 Respiratory Rate (breaths/min): 16 Body Mass Index (BMI): 27.1 Blood Pressure (mmHg): 140/58 Reference Range: 80 - 120 mg / dl Electronic Signature(s) Signed: 09/11/2016 5:04:45 PM By: [name] Entered By: [name] on 09/11/2016 08:52:48

## 2016-09-12 NOTE — Progress Notes (Signed)
[name], [name] ([id number]) Visit Report for 09/11/2016 Chief Complaint Document Details Patient Name: [name], [name]. Date of Service: 09/11/2016 8:45 AM Medical Record Patient Account Number: [id number] Number: Treating RN: [name] 1934-11-02 (81 y.o. Other Clinician: Date of Birth/Sex: Female) Treating Hannelore Bova Primary Care Provider: [name] Provider/Extender: G Referring Provider: [name] in Treatment: 42 Information Obtained from: Patient Chief Complaint Patient here for reevaluation of her right lower externally ulcer Electronic Signature(s) Signed: 09/11/2016 4:24:49 PM By: [name] MD Entered By: [name] on 09/11/2016 09:23:21 [name], [name] ([id number]) -------------------------------------------------------------------------------- HPI Details Patient Name: Carrie Mulligan C. Date of Service: 09/11/2016 8:45 AM Medical Record Patient Account Number: [id number] Number: Treating RN: [name] 12/19/1934 (81 y.o. Other Clinician: Date of Birth/Sex: Female) Treating Breyton Vanscyoc Primary Care Provider: [name] Provider/Extender: G Referring Provider: [name] in Treatment: 42 History of Present Illness HPI Description: 11/22/15; this is Shutes is a 82 year old woman who lives at home on her own. According to the patient and her daughter was present she has had long-standing edema in her legs dating back many years. She also has a history of chronic systolic heart failure, atrial fibrillation and is status post mitral valve replacement. Last echocardiogram I see in [facility] showed an ejection fraction of 40-45% she is on Lasix 60 mg a day and spironolactone 25 mg a day. Her current problem began in December around Christmas time she developed a small hematoma in the medial part of her left leg which rapidly expanded to a very large hematoma that required  surgical debridement. This situation was complicated by the fact that the patient is on long-standing Coumadin for mechanical heart valve. She went to the OR had this evacuated on January 4 /17. The wound has gradually improved however she has developed a small wounds around this area and more recently a wound on the right lateral leg. She is weeping edema fluid. The patient is already been to see vascular surgery. It was recommended that she wear Unna boots, she did not tolerate this due to pain in the left leg. She was then prescribed Juzo stockings and really can't get these on herself although truthfully there is probably too much edema for a Juzo stockings currently. She is not a diabetic and has no history of PAD or claudication that I could elicit. She does not use the external compression pumps reliably. She comes today with notes from her primary physician and Dr. Ronalee Belts both recommending various forms of compression but the patient does not really complied with them. Has been using the external compression pumps with some regularity but certainly not daily on the right leg and this has helped. I also note that her daughter tells me the history that she does not sleep in bed at home. She has a hospital bed but with her legs up she finds this painful so she sleeps in the couch sitting up with her legs dependent. 11/29/15; the patient is arrives today accompanied by her son. He expresses satisfaction that she is maintained the compression all week. 12/06/15; the patient has kept her Profore light wraps on, we have good edema control no major change in the wounds we have been using Aquacel 12/13/15; changed to RTD last week. One of the 3 wounds on her left medial leg is healed she has 2 remaining wounds here and one on the right lateral leg. 12/18/2015 -- the patient was at Dr. Nino Parsley office today and he  was seeing her for an arterial study. While the wrap was being removed she had an  inadvertent laceration of the left proximal anterior leg which bled quite profusely and a compression dressing was applied over this and the patient was here to get her Profore wraps done. I was asked to see the patient to make an assessment and treat appropriately. 12/26/15; the patient's injury on the left proximal leg and Steri-Strips removed after soaking. There is an open area here. The original wounds to still open on the right lateral and left medial leg. 01/03/16 patient's injury on her proximal left leg looks quite good. Still small open area on the medial left leg which appears to be improving. The area on the right lateral leg still substantially open with no real improvement in wound depth. Her edema control is marginal with a Profore light. We have been using RTD for 3-4 weeks without any major change 01/10/16: wounds without s/s of infection. vascular results are pending regarding arterial studies. [name], [name] ([id number]) 01/17/16; patient comes in today complaining of severe pain however I think most of this is in the right hip not related to her wounds. She continues with a oval-shaped wound on the right lateral leg, trauma to the left anterior leg just below her tibial plateau. She has a smaller eschar on the left anterior leg. She is being using Prisma however she informs [place] today that she is actually allergic to silver, nose this from a previous application at [facility] some years ago 01/24/16; edema is not so well controlled today, I think I reduced her to Henrietta bilaterally last week. The area which was a scissor injury on her left upper anterior lower leg is fully epithelialized. She only has a small open area remaining on the medial aspect of her left leg. The oval-shaped wound on the right mid lateral leg may be a bit smaller. Debrided of surface slough nonviable subcutaneous tissue. I had changed to collagen 2 weeks ago in an attempt to get this to close 01/31/16 all  the patient's wounds on her left leg give healed. We have good edema control with bilateral Profore lites which she has been compliant with. She still has the oval-shaped wound on the right lateral calf allergic even this appears to be slowly improving. The patient has juxtalite stockings at home. She states she thinks she can put these on. She also has external compression pumps at home although I think her compliance with this has not been good in the past. She complains today of edema in her thighs. Tells me she takes 40 mg of Lasix daily 02/21/16; only 1 small wound remains on the lateral aspect of the right calf. She is using Juzo stockings on the left leg although she complains about difficulty in applying them. She has external compression pumps at home 02/28/16; the small open wound on her right lateral calf is improved in terms of wound area. It appears that she has a wrap injury on the anterior aspect of the upper leg 03/06/16; the small open wound on her right lateral calf has a very small open area remaining. The wrap injury on the anterior aspect of the upper leg also appears better. She arrives today in clinic with a history of dyspnea with minimal exertion starting with the last 2-4 days. She does not describe chest pain. Her son and our intake nurse think she has facial swelling. She has a history of an artificial mitral valve on Coumadin  03/13/16; the small wound on the right lateral calf is no better this week. The superior wrap injury anteriorly requires debridement of surface eschar and nonviable subcutaneous tissue. She has been to her primary doctor with regards to her dyspnea we identified last week. Per the patient's description her Lasix has been increased 03/21/2016 -- patient of Dr. Dellia Nims who could not keep her appointment yesterday but has come in today with the right lower leg looking good and this is the leg which has been treated in the recent past. However, her left  lower extremity is extremely swollen, tender and edematous with redness and discoloration. 03/27/16; there is only 2 small areas remain on the right leg. The edema that was so concerning last week has come down however there is extensive bruising on the lateral left leg medial left foot suggesting that she lost some blood into the leg itself. I checked her hemoglobin on 9/21 was 14.5. Her INR was over 6. Duplex ultrasound was negative for DVT 04/03/16 at this point in time today patient is actually doing substantially better in regard to the wound on the anterior right lower extremity. currently there is no slough or eschar noted and no evidence of erythema, discharge, or local infection. She is exhibiting no signs of systemic infection. She is tolerating the dressing changes as well as the wrapping at this point in time. 04/24/16; I have not seen Mrs. Gunnoe for 3 weeks. Apparently she was admitted to hospital for respiratory issues/also apparently has had multiple compression fractures and had kyphoplasty. When she was last here she only had one small open area remaining on the left leg she was using juxtalite stockings. Her son is upset about restarting the Coumadin which she blames or the swelling in her legs. She is on Coumadin for prophylaxis with a chronic artificial heart valve. 04/30/16 at this point in time patient has been tolerating the 3 layer compression wrap as well as the calcium alginate. 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 With tthat being said she does have the [name], [place]. ([id number]) continued wound of the left lower extremity as well as the right medial lower extremity. The right side is significantly smaller compared to the left but actually is slough covered. fortunately she has no significant tenderness at rest although with manipulation of the right location of the wound this is significantly tender. 05/07/16 for follow-up evaluation today both the  patient's wounds appear to be significantly improved in size compared to last week. She is also having less pain at both locations currently. Her pain level at most is related to be a 1-2 out of 10. She does have some discharge but fortunately no evidence of infection at this point.she also continues to tolerate the compression wraps well. 05/14/16; the patient's wound on the left leg actually looks as though it's on its way to closure. She is using collagen to this area. She has a small punched out area over the right medial calf. The cause of this is not really clear it has probably 0.4 cm of depth. She has an IV in her right hand which she states is for IV fluid when she develops low sodiums her potassiums, the etiology of this is not clear 05-21-16 she presents today with continued ulcerations the right lower extremity, posterior aspect. she has been using Iodoflex and compression therapy to the area. She denies any new injuries or trauma. She does have 2 areas proximal to this ulceration of dry crust  along with an area to her left posterior lower leg that is purple discolored area appears similar to how her current ulceration started, she denies any trauma or injury to the left posterior leg we will monitor all of these areas. 05/28/16; the patient is down to 1 small open area on the right medial mid calf. This is however larger and in 50% of the surface area deeper approximately 0.4 cm. The reason for this deterioration is unclear. I've gone ahead and done a culture of this area she also tells [place] today that she is short of breath. She is also noted a swelling on her lower left eyelid 06/05/16; the patient is 1 small open area on the right medial mid calf. This looks about the same as last week. The deep area, medial 50% of the wound looks about the same. CandS I did last week was negative. The entire area looks about the same 06/12/16; not much change in this over the course of the last  week. Patient comes in today complaining of extreme back pain. Apparently she has either a kyphoplasty or vertebral plasty scheduled at [facility] radiology tomorrow. For this reason no debridement today. We have been using Iodoflex 06/19/16; patient arrived with the surface eschar from last week removed with a curet debrided of subcutaneous tissue. She tolerates this reasonably well. She is still in a skilled facility. States her vertebral plasty last week is made her pain bearable 06/26/16; patient arrived with a new wound on the posterior aspect of her left calf. She notices 3 or 4 days ago but is not certain how this happened. She states she simply felt a stinging sensation and one day on her foot and then the next day on the back of her calf. 06/28/2016 -- [name] called urgently saying that the pain in her left foot was drastically worse over the last several hours and though she removed her compression wrap she wanted to be seen before the long holiday. 07/03/16; she came in to see Dr. Con Memos on 12/29 for pain in her left foot/heel. Nothing much was found at the time and she states that the pain is better but involved her whole foot. She has a wound on the lateral aspect of the left calf and the medial aspect of the right. The medial 1 looks as if it is just about healed however the left one still has considerable edema. 07/09/16; she arrives in clinic today with a dime-sized wound on the left posterior calf and a small linear area on the right medial calf. The area on the left was debrided with a #3 curet nonviable subcutaneous tissue, we are not making progress here. The area on the right was also debrided of surface eschar and nonviable subcutaneous tissue this has some depth to it. 07/16/16; she has a dime size wound on the left posterior calf that has some depth. This required debridement last week, although I did not feel the need to do this today. We used Iodoflex last week.  Small area on the right also was not debrided. This looks like it may be close to closing 07/23/16; using Iodoflex to both wound areas. The area on the right posterior/medial calf is just about closed. She still has a fairly substantial wound on the left posterior leg which is still problematic. 07/30/16; the area on the posterior right leg is closed. The patient will graduate to her own pressure stocking. She has a punched-out area on the left mid calf.  The surface is better than last week. I did not attempt Favorite, Arhianna C. ([id number]) debridement. Continuing Iodoflex Is complaining about pain in the right lateral ribs this is not pleuritic. She apparently had a chest x-ray done at home the immobile X x-ray. I don't have access to that report. She is not complaining of inspiratory chest pain cough etc. 08/07/16; the posterior right leg wound is closed and she is wearing a compression stocking her son expresses satisfaction with her compliance with the stocking. He continues to have a fairly substantial punched-out circular area on the left posterior calf. This wound only came about in late December, she is not really sure how it happened. 08/13/16; no major change; small punched out wound on the left posterior calf. I have reviewed vascular hx. Followed by Dr. Ronalee Belts, not felt to have PAD 08/21/16; the area on her left posterior calf still a small punched out wound. Debrided this with a #3 curet of surface necrotic material however the wound continues to clean up quite nicely and I think the dimensions overall her smaller. We have been using Promogran. UNFORTUNATELY the patient arrives today with a fairly substantial skin tear on the right anterior lateral leg that was sustained when an aide was helping her put on her compression stockings. This happened 3 days ago. We will continue Promogran to all wound areas now including the right leg again. She is asked to keep the wraps that we put on in  place and we will discharge home health today at her request 08/28/16; the area on her left posterior calf is small punched out but appears to have some improvement still required debridement. The large skin tear on the right lateral leg looks healthy and is smaller no debridement is required. There is no evidence of surrounding cellulitis. Using Promogran on both areas 09/04/16; the area on the left posterior calf continues to gradually improve however the large more recent skin tear on her right lateral leg arrived with a large necrotic surface. We have been using Promogran and that was used to avoid adherence to the right leg wound. 3/1 14/18; the area on the left posterior calf continues to contract. Skin tear on her right lateral leg also looks a better this week. We've been using Promogran [allergic to Silver] Electronic Signature(s) Signed: 09/11/2016 4:24:49 PM By: [name] MD Entered By: [name] on 09/11/2016 09:24:05 [name], [name] ([id number]) -------------------------------------------------------------------------------- Physical Exam Details Patient Name: LANAYSIA, FRITCHMAN C. Date of Service: 09/11/2016 8:45 AM Medical Record Patient Account Number: [id number] Number: Treating RN: [name] 12-26-1934 (81 y.o. Other Clinician: Date of Birth/Sex: Female) Treating Naisha Wisdom Primary Care Provider: [name] Provider/Extender: G Referring Provider: [name] in Treatment: 42 Constitutional Sitting or standing Blood Pressure is within target range for patient.. Pulse regular and within target range for patient.Marland Kitchen Respirations regular, non-labored and within target range.. Temperature is normal and within the target range for the patient.. Patient's appearance is neat and clean. Appears in no acute distress. Well nourished and well developed.. Ears, Nose, Mouth, and Throat Auditory canals and tympanic membranes with normal  landmarks. No effusions or erythema.Marland Kitchen Respiratory Respiratory effort is easy and symmetric bilaterally. Rate is normal at rest and on room air.. Cardiovascular Pedal pulses palpable and strong bilaterally.. Lymphatic None palpable in the popliteal and inguinal area. Psychiatric No evidence of depression, anxiety, or agitation. Calm, cooperative, and communicative. Appropriate interactions and affect.. Notes Wound exam; the original with wound on the left  posterior leg looks very healthy. Smaller with a healthy surface. No debridement is necessary. oHer skin tear on the right anterior lateral leg required no debridement either. Surface is better today after an aggressive debridement last week there is no evidence of surrounding infection Electronic Signature(s) Signed: 09/11/2016 4:24:49 PM By: [name] MD Entered By: [name] on 09/11/2016 09:25:10 [name], [name] ([id number]) -------------------------------------------------------------------------------- Physician Orders Details Patient Name: Carrie Mulligan C. Date of Service: 09/11/2016 8:45 AM Medical Record Patient Account Number: [id number] Number: Treating RN: [name] May 08, 1935 (81 y.o. Other Clinician: Date of Birth/Sex: Female) Treating Marieann Zipp Primary Care Provider: [name] Provider/Extender: G Referring Provider: [name] in Treatment: 42 Verbal / Phone Orders: Yes Clinician: Carolyne Fiscal, Debi Read Back and Verified: Yes Diagnosis Coding Wound Cleansing Wound #10 Right,Lateral Lower Leg o Clean wound with Normal Saline. o Cleanse wound with mild soap and water o May Shower, gently pat wound dry prior to applying new dressing. Wound #9 Left,Posterior Lower Leg o Clean wound with Normal Saline. o Cleanse wound with mild soap and water o May Shower, gently pat wound dry prior to applying new dressing. Anesthetic Wound #10 Right,Lateral Lower  Leg o Topical Lidocaine 4% cream applied to wound bed prior to debridement - for clinic use Wound #9 Left,Posterior Lower Leg o Topical Lidocaine 4% cream applied to wound bed prior to debridement - for clinic use Primary Wound Dressing Wound #10 Right,Lateral Lower Leg o Mepitel One Contact layer o Promogran - plain collagen ****NO SILVER**** Wound #9 Left,Posterior Lower Leg o Mepitel One Contact layer o Promogran - plain collagen ****NO SILVER**** Secondary Dressing Wound #10 Right,Lateral Lower Leg o ABD pad Wound #9 Left,Posterior Lower Leg o ABD pad Reffitt, Eleanora C. ([id number]) Dressing Change Frequency Wound #10 Right,Lateral Lower Leg o Change dressing every week - only at wound care center Wound #9 Left,Posterior Lower Leg o Change dressing every week - only at wound care center Follow-up Appointments Wound #10 Right,Lateral Lower Leg o Return Appointment in 1 week. Wound #9 Left,Posterior Lower Leg o Return Appointment in 1 week. Edema Control Wound #10 Right,Lateral Lower Leg o Kerlix and Coban - Bilateral o Elevate legs to the level of the heart and pump ankles as often as possible Wound #9 Left,Posterior Lower Leg o Kerlix and Coban - Bilateral o Elevate legs to the level of the heart and pump ankles as often as possible Additional Orders / Instructions Wound #10 Right,Lateral Lower Leg o Increase protein intake. Wound #9 Left,Posterior Lower Leg o Increase protein intake. Electronic Signature(s) Signed: 09/11/2016 4:24:49 PM By: [name] MD Signed: 09/11/2016 5:04:45 PM By: [name] Entered By: [name] on 09/11/2016 09:18:28 [name], [name] ([id number]) -------------------------------------------------------------------------------- Problem List Details Patient Name: AKSHITHA, CULMER C. Date of Service: 09/11/2016 8:45 AM Medical Record Patient Account Number:  [id number] Number: Treating RN: [name] 19-Mar-1935 (81 y.o. Other Clinician: Date of Birth/Sex: Female) Treating Vendetta Pittinger Primary Care Provider: [name] Provider/Extender: G Referring Provider: [name] in Treatment: 42 Active Problems ICD-10 Encounter Code Description Active Date Diagnosis L97.812 Non-pressure chronic ulcer of other part of right lower leg 04/30/2016 Yes with fat layer exposed I87.333 Chronic venous hypertension (idiopathic) with ulcer and 04/03/2016 Yes inflammation of bilateral lower extremity I89.0 Lymphedema, not elsewhere classified 04/03/2016 Yes W29.93 Chronic systolic (congestive) heart failure 04/03/2016 Yes L97.221 Non-pressure chronic ulcer of left calf limited to 07/03/2016 Yes breakdown of skin Inactive Problems Resolved Problems ICD-10 Code Description Active Date  Resolved Date S81.812S Laceration without foreign body, left lower leg, sequela 04/03/2016 04/03/2016 Electronic Signature(s) Signed: 09/11/2016 4:24:49 PM By: [name] MD Entered By: [name] on 09/11/2016 09:23:07 Carrie, [name] ([id number]) 59, Sheron Loletha Grayer ([id number]) -------------------------------------------------------------------------------- Progress Note Details Patient Name: Carrie Mulligan C. Date of Service: 09/11/2016 8:45 AM Medical Record Patient Account Number: [id number] Number: Treating RN: [name] 10-Apr-1935 (81 y.o. Other Clinician: Date of Birth/Sex: Female) Treating Yalonda Sample Primary Care Provider: [name] Provider/Extender: G Referring Provider: [name] in Treatment: 42 Subjective Chief Complaint Information obtained from Patient Patient here for reevaluation of her right lower externally ulcer History of Present Illness (HPI) 11/22/15; this is Dowd is a 82 year old woman who lives at home on her own. According to the patient and her daughter was  present she has had long-standing edema in her legs dating back many years. She also has a history of chronic systolic heart failure, atrial fibrillation and is status post mitral valve replacement. Last echocardiogram I see in [facility] showed an ejection fraction of 40-45% she is on Lasix 60 mg a day and spironolactone 25 mg a day. Her current problem began in December around Christmas time she developed a small hematoma in the medial part of her left leg which rapidly expanded to a very large hematoma that required surgical debridement. This situation was complicated by the fact that the patient is on long-standing Coumadin for mechanical heart valve. She went to the OR had this evacuated on January 4 /17. The wound has gradually improved however she has developed a small wounds around this area and more recently a wound on the right lateral leg. She is weeping edema fluid. The patient is already been to see vascular surgery. It was recommended that she wear Unna boots, she did not tolerate this due to pain in the left leg. She was then prescribed Juzo stockings and really can't get these on herself although truthfully there is probably too much edema for a Juzo stockings currently. She is not a diabetic and has no history of PAD or claudication that I could elicit. She does not use the external compression pumps reliably. She comes today with notes from her primary physician and Dr. Ronalee Belts both recommending various forms of compression but the patient does not really complied with them. Has been using the external compression pumps with some regularity but certainly not daily on the right leg and this has helped. I also note that her daughter tells me the history that she does not sleep in bed at home. She has a hospital bed but with her legs up she finds this painful so she sleeps in the couch sitting up with her legs dependent. 11/29/15; the patient is arrives today accompanied by her  son. He expresses satisfaction that she is maintained the compression all week. 12/06/15; the patient has kept her Profore light wraps on, we have good edema control no major change in the wounds we have been using Aquacel 12/13/15; changed to RTD last week. One of the 3 wounds on her left medial leg is healed she has 2 remaining wounds here and one on the right lateral leg. 12/18/2015 -- the patient was at Dr. Nino Parsley office today and he was seeing her for an arterial study. While the wrap was being removed she had an inadvertent laceration of the left proximal anterior leg which bled quite profusely and a compression dressing was applied over this and the patient was here to get  her Profore wraps done. I was asked to see the patient to make an assessment and treat appropriately. 12/26/15; the patient's injury on the left proximal leg and Steri-Strips removed after soaking. There is an open area here. The original wounds to still open on the right lateral and left medial leg. IRAN, KIEVIT ([id number]) 01/03/16 patient's injury on her proximal left leg looks quite good. Still small open area on the medial left leg which appears to be improving. The area on the right lateral leg still substantially open with no real improvement in wound depth. Her edema control is marginal with a Profore light. We have been using RTD for 3-4 weeks without any major change 01/10/16: wounds without s/s of infection. vascular results are pending regarding arterial studies. 01/17/16; patient comes in today complaining of severe pain however I think most of this is in the right hip not related to her wounds. She continues with a oval-shaped wound on the right lateral leg, trauma to the left anterior leg just below her tibial plateau. She has a smaller eschar on the left anterior leg. She is being using Prisma however she informs [place] today that she is actually allergic to silver, nose this from a previous application  at [facility] some years ago 01/24/16; edema is not so well controlled today, I think I reduced her to [place] bilaterally last week. The area which was a scissor injury on her left upper anterior lower leg is fully epithelialized. She only has a small open area remaining on the medial aspect of her left leg. The oval-shaped wound on the right mid lateral leg may be a bit smaller. Debrided of surface slough nonviable subcutaneous tissue. I had changed to collagen 2 weeks ago in an attempt to get this to close 01/31/16 all the patient's wounds on her left leg give healed. We have good edema control with bilateral Profore lites which she has been compliant with. She still has the oval-shaped wound on the right lateral calf allergic even this appears to be slowly improving. The patient has juxtalite stockings at home. She states she thinks she can put these on. She also has external compression pumps at home although I think her compliance with this has not been good in the past. She complains today of edema in her thighs. Tells me she takes 40 mg of Lasix daily 02/21/16; only 1 small wound remains on the lateral aspect of the right calf. She is using Juzo stockings on the left leg although she complains about difficulty in applying them. She has external compression pumps at home 02/28/16; the small open wound on her right lateral calf is improved in terms of wound area. It appears that she has a wrap injury on the anterior aspect of the upper leg 03/06/16; the small open wound on her right lateral calf has a very small open area remaining. The wrap injury on the anterior aspect of the upper leg also appears better. She arrives today in clinic with a history of dyspnea with minimal exertion starting with the last 2-4 days. She does not describe chest pain. Her son and our intake nurse think she has facial swelling. She has a history of an artificial mitral valve on Coumadin 03/13/16; the small wound on  the right lateral calf is no better this week. The superior wrap injury anteriorly requires debridement of surface eschar and nonviable subcutaneous tissue. She has been to her primary doctor with regards to her dyspnea we identified last  week. Per the patient's description her Lasix has been increased 03/21/2016 -- patient of Dr. Dellia Nims who could not keep her appointment yesterday but has come in today with the right lower leg looking good and this is the leg which has been treated in the recent past. However, her left lower extremity is extremely swollen, tender and edematous with redness and discoloration. 03/27/16; there is only 2 small areas remain on the right leg. The edema that was so concerning last week has come down however there is extensive bruising on the lateral left leg medial left foot suggesting that she lost some blood into the leg itself. I checked her hemoglobin on 9/21 was 14.5. Her INR was over 6. Duplex ultrasound was negative for DVT 04/03/16 at this point in time today patient is actually doing substantially better in regard to the wound on the anterior right lower extremity. currently there is no slough or eschar noted and no evidence of erythema, discharge, or local infection. She is exhibiting no signs of systemic infection. She is tolerating the dressing changes as well as the wrapping at this point in time. 04/24/16; I have not seen Mrs. Dirusso for 3 weeks. Apparently she was admitted to hospital for respiratory issues/also apparently has had multiple compression fractures and had kyphoplasty. When she was last here she only had one small open area remaining on the left leg she was using juxtalite stockings. Her son YVAINE, JANKOWIAK ([id number]) is upset about restarting the Coumadin which she blames or the swelling in her legs. She is on Coumadin for prophylaxis with a chronic artificial heart valve. 04/30/16 at this point in time patient has been tolerating the  3 layer compression wrap as well as the calcium alginate. 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 With tthat being said she does have the continued wound of the left lower extremity as well as the right medial lower extremity. The right side is significantly smaller compared to the left but actually is slough covered. fortunately she has no significant tenderness at rest although with manipulation of the right location of the wound this is significantly tender. 05/07/16 for follow-up evaluation today both the patient's wounds appear to be significantly improved in size compared to last week. She is also having less pain at both locations currently. Her pain level at most is related to be a 1-2 out of 10. She does have some discharge but fortunately no evidence of infection at this point.she also continues to tolerate the compression wraps well. 05/14/16; the patient's wound on the left leg actually looks as though it's on its way to closure. She is using collagen to this area. She has a small punched out area over the right medial calf. The cause of this is not really clear it has probably 0.4 cm of depth. She has an IV in her right hand which she states is for IV fluid when she develops low sodiums her potassiums, the etiology of this is not clear 05-21-16 she presents today with continued ulcerations the right lower extremity, posterior aspect. she has been using Iodoflex and compression therapy to the area. She denies any new injuries or trauma. She does have 2 areas proximal to this ulceration of dry crust along with an area to her left posterior lower leg that is purple discolored area appears similar to how her current ulceration started, she denies any trauma or injury to the left posterior leg we will monitor all of these areas. 05/28/16; the patient  is down to 1 small open area on the right medial mid calf. This is however larger and in 50% of the surface area deeper approximately  0.4 cm. The reason for this deterioration is unclear. I've gone ahead and done a culture of this area she also tells [place] today that she is short of breath. She is also noted a swelling on her lower left eyelid 06/05/16; the patient is 1 small open area on the right medial mid calf. This looks about the same as last week. The deep area, medial 50% of the wound looks about the same. CandS I did last week was negative. The entire area looks about the same 06/12/16; not much change in this over the course of the last week. Patient comes in today complaining of extreme back pain. Apparently she has either a kyphoplasty or vertebral plasty scheduled at [facility] radiology tomorrow. For this reason no debridement today. We have been using Iodoflex 06/19/16; patient arrived with the surface eschar from last week removed with a curet debrided of subcutaneous tissue. She tolerates this reasonably well. She is still in a skilled facility. States her vertebral plasty last week is made her pain bearable 06/26/16; patient arrived with a new wound on the posterior aspect of her left calf. She notices 3 or 4 days ago but is not certain how this happened. She states she simply felt a stinging sensation and one day on her foot and then the next day on the back of her calf. 06/28/2016 -- [name] called urgently saying that the pain in her left foot was drastically worse over the last several hours and though she removed her compression wrap she wanted to be seen before the long holiday. 07/03/16; she came in to see Dr. Con Memos on 12/29 for pain in her left foot/heel. Nothing much was found at the time and she states that the pain is better but involved her whole foot. She has a wound on the lateral aspect of the left calf and the medial aspect of the right. The medial 1 looks as if it is just about healed however the left one still has considerable edema. 07/09/16; she arrives in clinic today with a dime-sized wound  on the left posterior calf and a small linear area on the right medial calf. The area on the left was debrided with a #3 curet nonviable subcutaneous tissue, we are not making progress here. The area on the right was also debrided of surface eschar and nonviable subcutaneous tissue this has some depth to it. 07/16/16; she has a dime size wound on the left posterior calf that has some depth. This required debridement last week, although I did not feel the need to do this today. We used Iodoflex last week. Small [name], [name] ([id number]) area on the right also was not debrided. This looks like it may be close to closing 07/23/16; using Iodoflex to both wound areas. The area on the right posterior/medial calf is just about closed. She still has a fairly substantial wound on the left posterior leg which is still problematic. 07/30/16; the area on the posterior right leg is closed. The patient will graduate to her own pressure stocking. She has a punched-out area on the left mid calf. The surface is better than last week. I did not attempt debridement. Continuing Iodoflex Is complaining about pain in the right lateral ribs this is not pleuritic. She apparently had a chest x-ray done at home the immobile X x-ray.  I don't have access to that report. She is not complaining of inspiratory chest pain cough etc. 08/07/16; the posterior right leg wound is closed and she is wearing a compression stocking her son expresses satisfaction with her compliance with the stocking. He continues to have a fairly substantial punched-out circular area on the left posterior calf. This wound only came about in late December, she is not really sure how it happened. 08/13/16; no major change; small punched out wound on the left posterior calf. I have reviewed vascular hx. Followed by Dr. Ronalee Belts, not felt to have PAD 08/21/16; the area on her left posterior calf still a small punched out wound. Debrided this with a #3  curet of surface necrotic material however the wound continues to clean up quite nicely and I think the dimensions overall her smaller. We have been using Promogran. UNFORTUNATELY the patient arrives today with a fairly substantial skin tear on the right anterior lateral leg that was sustained when an aide was helping her put on her compression stockings. This happened 3 days ago. We will continue Promogran to all wound areas now including the right leg again. She is asked to keep the wraps that we put on in place and we will discharge home health today at her request 08/28/16; the area on her left posterior calf is small punched out but appears to have some improvement still required debridement. The large skin tear on the right lateral leg looks healthy and is smaller no debridement is required. There is no evidence of surrounding cellulitis. Using Promogran on both areas 09/04/16; the area on the left posterior calf continues to gradually improve however the large more recent skin tear on her right lateral leg arrived with a large necrotic surface. We have been using Promogran and that was used to avoid adherence to the right leg wound. 3/1 14/18; the area on the left posterior calf continues to contract. Skin tear on her right lateral leg also looks a better this week. We've been using Promogran [allergic to Silver] Objective Constitutional Sitting or standing Blood Pressure is within target range for patient.. Pulse regular and within target range for patient.Marland Kitchen Respirations regular, non-labored and within target range.. Temperature is normal and within the target range for the patient.. Patient's appearance is neat and clean. Appears in no acute distress. Well nourished and well developed.. Vitals Time Taken: 8:52 AM, Height: 68 in, Weight: 178 lbs, BMI: 27.1, Temperature: 97.4 F, Pulse: 62 bpm, Respiratory Rate: 16 breaths/min, Blood Pressure: 140/58 mmHg. [name], [name]  ([id number]) Ears, Nose, Mouth, and Throat Auditory canals and tympanic membranes with normal landmarks. No effusions or erythema.Marland Kitchen Respiratory Respiratory effort is easy and symmetric bilaterally. Rate is normal at rest and on room air.. Cardiovascular Pedal pulses palpable and strong bilaterally.. Lymphatic None palpable in the popliteal and inguinal area. Psychiatric No evidence of depression, anxiety, or agitation. Calm, cooperative, and communicative. Appropriate interactions and affect.. General Notes: Wound exam; the original with wound on the left posterior leg looks very healthy. Smaller with a healthy surface. No debridement is necessary. Her skin tear on the right anterior lateral leg required no debridement either. Surface is better today after an aggressive debridement last week there is no evidence of surrounding infection Integumentary (Hair, Skin) Wound #10 status is Open. Original cause of wound was Shear/Friction. The wound is located on the Right,Lateral Lower Leg. The wound measures 1.6cm length x 4.5cm width x 0.1cm depth; 5.655cm^2 area and 0.565cm^3 volume. There is no  tunneling or undermining noted. There is a large amount of serosanguineous drainage noted. The wound margin is distinct with the outline attached to the wound base. There is large (67-100%) red granulation within the wound bed. There is a small (1-33%) amount of necrotic tissue within the wound bed including Adherent Slough. Periwound temperature was noted as No Abnormality. The periwound has tenderness on palpation. Wound #9 status is Open. Original cause of wound was Gradually Appeared. The wound is located on the Left,Posterior Lower Leg. The wound measures 0.9cm length x 0.8cm width x 0.1cm depth; 0.565cm^2 area and 0.057cm^3 volume. The wound is limited to skin breakdown. There is no tunneling or undermining noted. There is a large amount of serosanguineous drainage noted. The wound margin is flat  and intact. There is large (67-100%) red granulation within the wound bed. There is a small (1-33%) amount of necrotic tissue within the wound bed including Adherent Slough. The periwound skin appearance exhibited: Ecchymosis. Periwound temperature was noted as No Abnormality. The periwound has tenderness on palpation. 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 Cleanse wound with mild soap and water May Shower, gently pat wound dry prior to applying new dressing. Wound #9 Left,Posterior Lower Leg: Clean wound with Normal Saline. Cleanse wound with mild soap and water May Shower, gently pat wound dry prior to applying new dressing. Anesthetic: Wound #10 Right,Lateral Lower Leg: Topical Lidocaine 4% cream applied to wound bed prior to debridement - for clinic use Wound #9 Left,Posterior Lower Leg: Topical Lidocaine 4% cream applied to wound bed prior to debridement - for clinic use Primary Wound Dressing: Wound #10 Right,Lateral Lower Leg: Mepitel One Contact layer Promogran - plain collagen ****NO SILVER**** Wound #9 Left,Posterior Lower Leg: Mepitel One Contact layer Promogran - plain collagen ****NO SILVER**** Secondary Dressing: Wound #10 Right,Lateral Lower Leg: ABD pad Wound #9 Left,Posterior Lower Leg: ABD pad Dressing Change Frequency: Wound #10 Right,Lateral Lower Leg: Change dressing every week - only at wound care center Wound #9 Left,Posterior Lower Leg: Change dressing every week  - only at wound care center Follow-up Appointments: Wound #10 Right,Lateral Lower Leg: Return Appointment in 1 week. Wound #9 Left,Posterior Lower Leg: Return Appointment in 1 week. Edema Control: Wound #10 Right,Lateral Lower Leg: Kerlix and Coban - Bilateral Casco, Latashia C. ([id number]) Elevate legs to the level of the heart and pump ankles as often as possible Wound #9 Left,Posterior Lower Leg: Kerlix and Coban - Bilateral Elevate legs to the level of the heart and pump ankles as often as possible Additional Orders / Instructions: Wound #10 Right,Lateral Lower Leg: Increase protein intake. Wound #9 Left,Posterior Lower Leg: Increase protein intake. We continue using Promogran to both wound areas. Where making nice progress The patient asked about continued wrapping and I informed her I like to continue this until both wounds are healed Electronic Signature(s) Signed: 09/11/2016 4:24:49 PM By: [name] MD Entered By: [name] on 09/11/2016 09:26:15 [name], [name] ([id number]) -------------------------------------------------------------------------------- SuperBill Details Patient Name: Carrie Mulligan C. Date of Service: 09/11/2016 Medical Record Patient Account Number: [id number] Number: Treating RN: [name]  1935-04-18 (82 y.o. Other Clinician: Date of Birth/Sex: Female) Treating Hau Sanor Primary Care Provider: [name] Provider/Extender: G Referring Provider: [name] Service Line: Outpatient Weeks in Treatment: 42 Diagnosis Coding ICD-10 Codes Code Description [phone] Non-pressure chronic ulcer of other part of right lower leg with fat layer exposed Chronic venous hypertension (idiopathic) with ulcer and inflammation of bilateral lower I87.333 extremity I89.0 Lymphedema, not elsewhere classified D05.18 Chronic systolic (congestive) heart failure L97.221 Non-pressure chronic ulcer of left calf limited to  breakdown of skin Facility Procedures CPT4 Code: [id number] Description: 99214 - WOUND CARE VISIT-LEV 4 EST PT Modifier: Quantity: 1 Physician Procedures CPT4: Description Modifier Quantity Code [id number] 99213 - WC PHYS LEVEL 3 - EST PT 1 ICD-10 Description Diagnosis L97.812 Non-pressure chronic ulcer of other part of right lower leg with fat layer exposed L97.221 Non-pressure chronic ulcer of left calf  limited to breakdown of skin Electronic Signature(s) Signed: 09/11/2016 4:24:49 PM By: [name] MD Signed: 09/11/2016 5:04:45 PM By: [name] Entered By: [name] on 09/11/2016 11:01:42

## 2016-09-13 ENCOUNTER — Ambulatory Visit: Payer: Medicare Other | Admitting: Cardiovascular Disease

## 2016-09-18 ENCOUNTER — Encounter: Payer: Medicare Other | Admitting: Internal Medicine

## 2016-09-18 DIAGNOSIS — I87333 Chronic venous hypertension (idiopathic) with ulcer and inflammation of bilateral lower extremity: Secondary | ICD-10-CM | POA: Diagnosis not present

## 2016-09-19 NOTE — Progress Notes (Signed)
ANJULI, GEMMILL ([id number]) Visit Report for 09/18/2016 Arrival Information Details Patient Name: Carrie, [name]. Date of Service: 09/18/2016 1:30 PM Medical Record Patient Account Number: [id number] Number: Treating RN: [name] Sep 12, 1934 (82 y.o. Other Clinician: Date of Birth/Sex: Female) Treating [name], [place] Primary Care Tacara Hadlock: [name] Melville Engen/Extender: G Referring Wilkes Potvin: [name] in Treatment: 43 Visit Information History Since Last Visit All ordered tests and consults were completed: No Patient Arrived: Gilford Rile Added or deleted any medications: No Arrival Time: 13:33 Any new allergies or adverse reactions: No Accompanied By: son Had a fall or experienced change in No Transfer Assistance: EasyPivot Patient activities of daily living that may affect Lift risk of falls: Patient Identification Verified: Yes Signs or symptoms of abuse/neglect since last No Secondary Verification Process Yes visito Completed: Hospitalized since last visit: No Patient Requires Transmission- No Has Dressing in Place as Prescribed: Yes Based Precautions: Has Compression in Place as Prescribed: Yes Patient Has Alerts: Yes Pain Present Now: No Patient Alerts: Patient on Blood Thinner [place]!!! Electronic Signature(s) Signed: 09/18/2016 4:46:19 PM By: [name] Entered By: [name] on 09/18/2016 13:34:08 Trafton, [name] ([id number]) -------------------------------------------------------------------------------- Clinic Level of Care Assessment Details Patient Name: [name]. Date of Service: 09/18/2016 1:30 PM Medical Record Patient Account Number: [id number] Number: Treating RN: [name] 1935/02/15 (82 y.o. Other Clinician: Date of Birth/Sex: Female) Treating [name], [place] Primary Care Jacklyne Baik: [name] Lacosta Hargan/Extender: G Referring Falon Huesca:  [name] in Treatment: 43 Clinic Level of Care Assessment Items TOOL 4 Quantity Score X - Use when only an EandM is performed on FOLLOW-UP visit 1 0 ASSESSMENTS - Nursing Assessment / Reassessment X - Reassessment of Co-morbidities (includes updates in patient status) 1 10 X - Reassessment of Adherence to Treatment Plan 1 5 ASSESSMENTS - Wound and Skin Assessment / Reassessment []  - Simple Wound Assessment / Reassessment - one wound 0 X - Complex Wound Assessment / Reassessment - multiple wounds 2 5 []  - Dermatologic / Skin Assessment (not related to wound area) 0 ASSESSMENTS - Focused Assessment X - Circumferential Edema Measurements - multi extremities 2 5 []  - Nutritional Assessment / Counseling / Intervention 0 []  - Lower Extremity Assessment (monofilament, tuning fork, pulses) 0 []  - Peripheral Arterial Disease Assessment (using hand held doppler) 0 ASSESSMENTS - Ostomy and/or Continence Assessment and Care []  - Incontinence Assessment and Management 0 []  - Ostomy Care Assessment and Management (repouching, etc.) 0 PROCESS - Coordination of Care X - Simple Patient / Family Education for ongoing care 1 15 []  - Complex (extensive) Patient / Family Education for ongoing care 0 []  - Staff obtains Programmer, systems, Records, Test Results / Process Orders 0 []  - Staff telephones HHA, Nursing Homes / Clarify orders / etc 0 Futrell, [name] ([id number]) []  - Routine Transfer to another Facility (non-emergent condition) 0 []  - Routine Hospital Admission (non-emergent condition) 0 []  - New Admissions / Biomedical engineer / Ordering NPWT, Apligraf, etc. 0 []  - Emergency Hospital Admission (emergent condition) 0 X - Simple Discharge Coordination 1 10 []  - Complex (extensive) Discharge Coordination 0 PROCESS - Special Needs []  - Pediatric / Minor Patient Management 0 []  - Isolation Patient Management 0 []  - Hearing / Language / Visual special needs 0 []  - Assessment of Community  assistance (transportation, D/C planning, etc.) 0 []  - Additional assistance / Altered mentation 0 []  - Support Surface(s) Assessment (bed, cushion, seat, etc.) 0 INTERVENTIONS - Wound Cleansing / Measurement []  -  Simple Wound Cleansing - one wound 0 X - Complex Wound Cleansing - multiple wounds 2 5 X - Wound Imaging (photographs - any number of wounds) 1 5 []  - Wound Tracing (instead of photographs) 0 []  - Simple Wound Measurement - one wound 0 X - Complex Wound Measurement - multiple wounds 2 5 INTERVENTIONS - Wound Dressings []  - Small Wound Dressing one or multiple wounds 0 []  - Medium Wound Dressing one or multiple wounds 0 X - Large Wound Dressing one or multiple wounds 2 20 X - Application of Medications - topical 1 5 []  - Application of Medications - injection 0 Razon, [name] ([id number]) INTERVENTIONS - Miscellaneous []  - External ear exam 0 []  - Specimen Collection (cultures, biopsies, blood, body fluids, etc.) 0 []  - Specimen(s) / Culture(s) sent or taken to Lab for analysis 0 []  - Patient Transfer (multiple staff / Civil Service fast streamer / Similar devices) 0 []  - Simple Staple / Suture removal (25 or less) 0 []  - Complex Staple / Suture removal (26 or more) 0 []  - Hypo / Hyperglycemic Management (close monitor of Blood Glucose) 0 []  - Ankle / Brachial Index (ABI) - do not check if billed separately 0 X - Vital Signs 1 5 Has the patient been seen at the hospital within the last three years: Yes Total Score: 135 Level Of Care: New/Established - Level 4 Electronic Signature(s) Signed: 09/18/2016 4:46:19 PM By: [name] Entered By: [name] on 09/18/2016 14:32:24 Blowers, [name] ([id number]) -------------------------------------------------------------------------------- Encounter Discharge Information Details Patient Name: Carrie Mulligan C. Date of Service: 09/18/2016 1:30 PM Medical Record Patient Account Number: [id number] Number: Treating  RN: [name] 1934-07-17 (82 y.o. Other Clinician: Date of Birth/Sex: Female) Treating [name], [name] Primary Care Tresa Jolley: [name] Ranjit Ashurst/Extender: G Referring Everli Rother: [name] in Treatment: 43 Encounter Discharge Information Items Discharge Pain Level: 0 Discharge Condition: Stable Ambulatory Status: Walker Discharge Destination: Home Private Transportation: Auto Accompanied By: son Schedule Follow-up Appointment: Yes Medication Reconciliation completed and No provided to Patient/Care Kentavious Michele: Patient Clinical Summary of Care: Declined Electronic Signature(s) Signed: 09/18/2016 2:20:55 PM By: Ruthine Dose Entered By: Ruthine Dose on 09/18/2016 14:20:55 Vargus, [name] ([id number]) -------------------------------------------------------------------------------- Lower Extremity Assessment Details Patient Name: Carrie Mulligan C. Date of Service: 09/18/2016 1:30 PM Medical Record Patient Account Number: [id number] Number: Treating RN: [name] [birth year] (81 y.o. Other Clinician: Date of Birth/Sex: Female) Treating [name], [place] Primary Care Gennett Garcia: [name] Raziya Aveni/Extender: G Referring Tukker Byrns: [name] in Treatment: 43 Edema Assessment Assessed: [Left: No] [Right: No] E[Left: dema] [Right: :] Calf Left: Right: Point of Measurement: 34 cm From Medial Instep 29.6 cm 29.6 cm Ankle Left: Right: Point of Measurement: 10 cm From Medial Instep 20 cm 20.7 cm Vascular Assessment Pulses: Dorsalis Pedis Palpable: [Left:Yes] [Right:Yes] Posterior Tibial Palpable: [Left:Yes] Extremity colors, hair growth, and conditions: Extremity Color: [Left:Mottled] [Right:Mottled] Temperature of Extremity: [Left:Warm] [Right:Warm] Capillary Refill: [Left:< 3 seconds] [Right:< 3 seconds] Electronic Signature(s) Signed: 09/18/2016 4:46:19 PM By: [name] Entered By: [name] on 09/18/2016  13:53:32 [place], [name] Kitchen ([id number]) -------------------------------------------------------------------------------- Multi Wound Chart Details Patient Name: Carrie Mulligan C. Date of Service: 09/18/2016 1:30 PM Medical Record Patient Account Number: [id number] Number: Treating RN: [name] 01-Oct-1934 (81 y.o. Other Clinician: Date of Birth/Sex: Female) Treating [name], [name] Primary Care Tracker Mance: [name] Osborn Pullin/Extender: G Referring Gerrit Rafalski: [name] in Treatment: 43 Vital Signs Height(in): 68 Pulse(bpm): 65 Weight(lbs): 178 Blood Pressure 127/58 (mmHg): Body Mass Index(BMI): 27 Temperature(F): 97.7  Respiratory Rate 16 (breaths/min): Photos: [10:No Photos] [9:No Photos] [N/A:N/A] Wound Location: [10:Right Lower Leg - Lateral] [9:Left Lower Leg - Posterior] [N/A:N/A] Wounding Event: [10:Shear/Friction] [9:Gradually Appeared] [N/A:N/A] Primary Etiology: [10:Skin Tear] [9:Venous Leg Ulcer] [N/A:N/A] Comorbid History: [10:Cataracts, Arrhythmia, Hypotension, Osteoarthritis] [9:Cataracts, Arrhythmia, Hypotension, Osteoarthritis] [N/A:N/A] Date Acquired: [10:08/20/2016] [9:06/26/2016] [N/A:N/A] Weeks of Treatment: [10:4] [9:12] [N/A:N/A] Wound Status: [10:Open] [9:Open] [N/A:N/A] Measurements L x W x D 1x2.6x0.1 [9:0.9x0.6x0.1] [N/A:N/A] (cm) Area (cm) : [10:2.042] [9:0.424] [N/A:N/A] Volume (cm) : [10:0.204] [9:0.042] [N/A:N/A] % Reduction in Area: [10:78.50%] [9:92.80%] [N/A:N/A] % Reduction in Volume: 78.50% [9:92.90%] [N/A:N/A] Classification: [10:Partial Thickness] [9:Full Thickness Without Exposed Support Structures] [N/A:N/A] Exudate Amount: [10:Large] [9:Large] [N/A:N/A] Exudate Type: [10:Serosanguineous] [9:Serosanguineous] [N/A:N/A] Exudate Color: [10:red, brown] [9:red, brown] [N/A:N/A] Wound Margin: [10:Distinct, outline attached Flat and Intact] [N/A:N/A] Granulation Amount: [10:Large (67-100%)] [9:Large (67-100%)]  [N/A:N/A] Granulation Quality: [10:Red] [9:Red] [N/A:N/A] Necrotic Amount: [10:Small (1-33%)] [9:Small (1-33%)] [N/A:N/A] Epithelialization: [10:None] [9:None] [N/A:N/A] Periwound Skin Texture: No Abnormalities Noted No Abnormalities Noted [N/A:N/A] Periwound Skin No Abnormalities Noted No Abnormalities Noted N/A Moisture: Periwound Skin Color: No Abnormalities Noted Ecchymosis: Yes N/A Temperature: No Abnormality No Abnormality N/A Tenderness on Yes Yes N/A Palpation: Wound Preparation: Ulcer Cleansing: Ulcer Cleansing: N/A Rinsed/Irrigated with Rinsed/Irrigated with Saline Saline Topical Anesthetic Topical Anesthetic Applied: Other: lidocaine Applied: Other: lidociane 4% 4% Treatment Notes Electronic Signature(s) Signed: 09/18/2016 4:37:19 PM By: [name] MD Entered By: [name] on 09/18/2016 14:01:21 Finamore, [name] ([id number]) -------------------------------------------------------------------------------- Multi-Disciplinary Care Plan Details Patient Name: JACQUILINE, ZURCHER. Date of Service: 09/18/2016 1:30 PM Medical Record Patient Account Number: [id number] Number: Treating RN: [name] 01/24/1935 (81 y.o. Other Clinician: Date of Birth/Sex: Female) Treating [name], [name] Primary Care Korena Nass: [name] Emmogene Simson/Extender: G Referring Mykira Hofmeister: [name] in Treatment: 29 Active Inactive ` Orientation to the Wound Care Program Nursing Diagnoses: Knowledge deficit related to the wound healing center program Goals: Patient/caregiver will verbalize understanding of the [place] Date Initiated: 11/22/2015 Target Resolution Date: 09/28/2016 Goal Status: Active Interventions: Provide education on orientation to the wound center Notes: ` Venous Leg Ulcer Nursing Diagnoses: Knowledge deficit related to disease process and management Potential for venous Insuffiency (use before diagnosis  confirmed) Goals: Patient will maintain optimal edema control Date Initiated: 11/22/2015 Target Resolution Date: 09/28/2016 Goal Status: Active Patient/caregiver will verbalize understanding of disease process and disease management Date Initiated: 11/22/2015 Target Resolution Date: 09/28/2016 Goal Status: Active Verify adequate tissue perfusion prior to therapeutic compression application Date Initiated: 11/22/2015 Target Resolution Date: 09/28/2016 Goal Status: Active Interventions: SHALINDA, BURKHOLDER ([id number]) Assess peripheral edema status every visit. Compression as ordered Provide education on venous insufficiency Treatment Activities: Non-invasive vascular studies : 11/22/2015 Therapeutic compression applied : 11/22/2015 Notes: ` Wound/Skin Impairment Nursing Diagnoses: Impaired tissue integrity Knowledge deficit related to ulceration/compromised skin integrity Goals: Patient/caregiver will verbalize understanding of skin care regimen Date Initiated: 11/22/2015 Target Resolution Date: 09/28/2016 Goal Status: Active Ulcer/skin breakdown will have a volume reduction of 30% by week 4 Date Initiated: 11/22/2015 Target Resolution Date: 09/28/2016 Goal Status: Active Ulcer/skin breakdown will have a volume reduction of 50% by week 8 Date Initiated: 11/22/2015 Target Resolution Date: 09/28/2016 Goal Status: Active Ulcer/skin breakdown will have a volume reduction of 80% by week 12 Date Initiated: 11/22/2015 Target Resolution Date: 09/28/2016 Goal Status: Active Ulcer/skin breakdown will heal within 14 weeks Date Initiated: 11/22/2015 Target Resolution Date: 09/28/2016 Goal Status: Active Interventions: Assess patient/caregiver ability to perform ulcer/skin care regimen upon admission and as needed Assess ulceration(s) every  visit Provide education on ulcer and skin care Treatment Activities: Skin care regimen initiated : 11/22/2015 Topical wound management initiated :  11/22/2015 Notes: Carrie, [name] ([id number]) Electronic Signature(s) Signed: 09/18/2016 4:46:19 PM By: [name] Entered By: [name] on 09/18/2016 13:53:38 Bouska, Levina C. ([id number]) -------------------------------------------------------------------------------- Pain Assessment Details Patient Name: Carrie Mulligan C. Date of Service: 09/18/2016 1:30 PM Medical Record Patient Account Number: [id number] Number: Treating RN: [name] 1935-03-01 (81 y.o. Other Clinician: Date of Birth/Sex: Female) Treating [name], [name] Primary Care Hatley Henegar: [name] Lisia [place]/Extender: G Referring Selassie Spatafore: [name] in Treatment: 43 Active Problems Location of Pain Severity and Description of Pain Patient Has Paino No Site Locations With Dressing Change: No Pain Management and Medication Current Pain Management: Electronic Signature(s) Signed: 09/18/2016 4:46:19 PM By: [name] Entered By: [name] on 09/18/2016 13:34:14 Livengood, [name] ([id number]) -------------------------------------------------------------------------------- Patient/Caregiver Education Details Patient Name: [name]. Date of Service: 09/18/2016 1:30 PM Medical Record Patient Account Number: [id number] Number: Treating RN: [name] [birth year] (81 y.o. Other Clinician: Date of Birth/Gender: Female) Treating [name], [name] Primary Care Physician/Extender: Claudette Laws Physician: Suella [place] in Treatment: 43 Referring Physician: [name] Education Assessment Education Provided To: Patient Education Topics Provided Wound/Skin Impairment: Handouts: Other: change dressing as ordered Methods: Demonstration, Explain/Verbal Responses: State content correctly Electronic Signature(s) Signed: 09/18/2016 4:46:19 PM By: [name] Entered By: [name] on 09/18/2016 14:18:22 Fleissner, [place].  ([id number]) -------------------------------------------------------------------------------- Wound Assessment Details Patient Name: Carrie Mulligan C. Date of Service: 09/18/2016 1:30 PM Medical Record Patient Account Number: [id number] Number: Treating RN: [name] Dec 17, 1934 (81 y.o. Other Clinician: Date of Birth/Sex: Female) Treating [name], [place] Primary Care Leontina Skidmore: [name] Valina Maes/Extender: G Referring Lanai [name]: [name] in Treatment: 43 Wound Status Wound Number: 10 Primary Skin Tear Etiology: Wound Location: Right Lower Leg - Lateral Wound Status: Open Wounding Event: Shear/Friction Comorbid Cataracts, Arrhythmia, Hypotension, Date Acquired: 08/20/2016 History: Osteoarthritis Weeks Of Treatment: 4 Clustered Wound: No Photos Photo Uploaded By: [name] on 09/18/2016 16:30:11 Wound Measurements Length: (cm) 1 Width: (cm) 2.6 Depth: (cm) 0.1 Area: (cm) 2.042 Volume: (cm) 0.204 % Reduction in Area: 78.5% % Reduction in Volume: 78.5% Epithelialization: None Tunneling: No Undermining: No Wound Description Classification: Partial Thickness Foul Odor Aft Wound Margin: Distinct, outline attached Slough/Fibrin Exudate Amount: Large Exudate Type: Serosanguineous Exudate Color: red, brown er Cleansing: No o No Wound Bed Granulation Amount: Large (67-100%) Granulation Quality: Red Necrotic Amount: Small (1-33%) Maceachern, [name] ([id number]) Necrotic Quality: Adherent Slough Periwound Skin Texture Texture Color No Abnormalities Noted: No No Abnormalities Noted: No Moisture Temperature / Pain No Abnormalities Noted: No Temperature: No Abnormality Tenderness on Palpation: Yes Wound Preparation Ulcer Cleansing: Rinsed/Irrigated with Saline Topical Anesthetic Applied: Other: lidocaine 4%, Treatment Notes Wound #10 (Right, Lateral Lower Leg) 1. Cleansed with: Clean wound with Normal Saline Cleanse wound  with antibacterial soap and water 2. Anesthetic Topical Lidocaine 4% cream to wound bed prior to debridement 4. Dressing Applied: Mepitel Promogran 5. Secondary Dressing Applied ABD Pad Dry Gauze 7. Secured with Tape Notes kerlix and Event organiser) Signed: 09/18/2016 4:46:19 PM By: [name] Entered By: [name] on 09/18/2016 13:50:00 Ketchem, [name] ([id number]) -------------------------------------------------------------------------------- Wound Assessment Details Patient Name: Carrie Mulligan C. Date of Service: 09/18/2016 1:30 PM Medical Record Patient Account Number: [id number] Number: Treating RN: [name] Nov 06, 1934 (81 y.o. Other Clinician: Date of Birth/Sex: Female) Treating [name], [name] Primary Care Zanayah Shadowens: [name] Aliyah Abeyta/Extender: G Referring Rocsi Hazelbaker: [name]  Weeks in Treatment: 43 Wound Status Wound Number: 9 Primary Venous Leg Ulcer Etiology: Wound Location: Left Lower Leg - Posterior Wound Status: Open Wounding Event: Gradually Appeared Comorbid Cataracts, Arrhythmia, Hypotension, Date Acquired: 06/26/2016 History: Osteoarthritis Weeks Of Treatment: 12 Clustered Wound: No Photos Photo Uploaded By: [name] on 09/18/2016 16:30:11 Wound Measurements Length: (cm) 0.9 Width: (cm) 0.6 Depth: (cm) 0.1 Area: (cm) 0.424 Volume: (cm) 0.042 % Reduction in Area: 92.8% % Reduction in Volume: 92.9% Epithelialization: None Tunneling: No Undermining: No Wound Description Full Thickness Without Exposed Classification: Support Structures Wound Margin: Flat and Intact Exudate Large Amount: Exudate Type: Serosanguineous Exudate Color: red, brown Foul Odor After Cleansing: No Wound Bed Granulation Amount: Large (67-100%) Exposed Structure Brule, [name] ([id number]) Granulation Quality: Red Fascia Exposed: No Necrotic Amount: Small (1-33%) Fat Layer (Subcutaneous  Tissue) Exposed: No Necrotic Quality: Adherent Slough Tendon Exposed: No Muscle Exposed: No Joint Exposed: No Bone Exposed: No Limited to Skin Breakdown Periwound Skin Texture Texture Color No Abnormalities Noted: No No Abnormalities Noted: No Ecchymosis: Yes Moisture No Abnormalities Noted: No Temperature / Pain Temperature: No Abnormality Tenderness on Palpation: Yes Wound Preparation Ulcer Cleansing: Rinsed/Irrigated with Saline Topical Anesthetic Applied: Other: lidociane 4%, Treatment Notes Wound #9 (Left, Posterior Lower Leg) 1. Cleansed with: Clean wound with Normal Saline Cleanse wound with antibacterial soap and water 2. Anesthetic Topical Lidocaine 4% cream to wound bed prior to debridement 4. Dressing Applied: Mepitel Promogran 5. Secondary Dressing Applied ABD Pad Dry Gauze 7. Secured with Tape Notes kerlix and Event organiser) Signed: 09/18/2016 4:46:19 PM By: [name] Entered By: [name] on 09/18/2016 13:49:09 Constante, [name] ([id number]) -------------------------------------------------------------------------------- Vitals Details Patient Name: Carrie Mulligan C. Date of Service: 09/18/2016 1:30 PM Medical Record Patient Account Number: [id number] Number: Treating RN: [name] [birth year] (81 y.o. Other Clinician: Date of Birth/Sex: Female) Treating [name], [name] Primary Care Sissi Padia: [name] Korben Carcione/Extender: G Referring Justino Boze: [name] in Treatment: 43 Vital Signs Time Taken: 13:34 Temperature (F): 97.7 Height (in): 68 Pulse (bpm): 65 Weight (lbs): 178 Respiratory Rate (breaths/min): 16 Body Mass Index (BMI): 27.1 Blood Pressure (mmHg): 127/58 Reference Range: 80 - 120 mg / dl Electronic Signature(s) Signed: 09/18/2016 4:46:19 PM By: [name] Entered By: [name] on 09/18/2016 13:36:02

## 2016-09-19 NOTE — Progress Notes (Signed)
[name], [name] ([id number]) Visit Report for 09/18/2016 Chief Complaint Document Details Patient Name: [name], [name]. Date of Service: 09/18/2016 1:30 PM Medical Record Patient Account Number: [id number] Number: Treating RN: [name] [birth year] (81 y.o. Other Clinician: Date of Birth/Sex: Female) Treating [name], [name] Primary Care Provider: [name] Provider/Extender: G Referring Provider: [name] in Treatment: 43 Information Obtained from: Patient Chief Complaint Patient here for reevaluation of her right lower externally ulcer Electronic Signature(s) Signed: 09/18/2016 4:37:19 PM By: [name] MD Entered By: [name] on 09/18/2016 14:02:00 Skirvin, [name] ([id number]) -------------------------------------------------------------------------------- HPI Details Patient Name: [name]. Date of Service: 09/18/2016 1:30 PM Medical Record Patient Account Number: [id number] Number: Treating RN: [name] 1934/10/27 (81 y.o. Other Clinician: Date of Birth/Sex: Female) Treating [name], [name] Primary Care Provider: [name] Provider/Extender: G Referring Provider: [name] in Treatment: 43 History of Present Illness HPI Description: 11/22/15; this is Mckamey is a 82 year old woman who lives at home on her own. According to the patient and her daughter was present she has had long-standing edema in her legs dating back many years. She also has a history of chronic systolic heart failure, atrial fibrillation and is status post mitral valve replacement. Last echocardiogram I see in [facility] showed an ejection fraction of 40-45% she is on Lasix 60 mg a day and spironolactone 25 mg a day. Her current problem began in December around Christmas time she developed a small hematoma in the medial part of her left leg which rapidly expanded to a very large hematoma that required  surgical debridement. This situation was complicated by the fact that the patient is on long-standing Coumadin for mechanical heart valve. She went to the OR had this evacuated on January 4 /17. The wound has gradually improved however she has developed a small wounds around this area and more recently a wound on the right lateral leg. She is weeping edema fluid. The patient is already been to see vascular surgery. It was recommended that she wear Unna boots, she did not tolerate this due to pain in the left leg. She was then prescribed Juzo stockings and really can't get these on herself although truthfully there is probably too much edema for a Juzo stockings currently. She is not a diabetic and has no history of PAD or claudication that I could elicit. She does not use the external compression pumps reliably. She comes today with notes from her primary physician and Dr. Ronalee Belts both recommending various forms of compression but the patient does not really complied with them. Has been using the external compression pumps with some regularity but certainly not daily on the right leg and this has helped. I also note that her daughter tells me the history that she does not sleep in bed at home. She has a hospital bed but with her legs up she finds this painful so she sleeps in the couch sitting up with her legs dependent. 11/29/15; the patient is arrives today accompanied by her son. He expresses satisfaction that she is maintained the compression all week. 12/06/15; the patient has kept her Profore light wraps on, we have good edema control no major change in the wounds we have been using Aquacel 12/13/15; changed to RTD last week. One of the 3 wounds on her left medial leg is healed she has 2 remaining wounds here and one on the right lateral leg. 12/18/2015 -- the patient was at Dr. Nino Parsley office today and he  was seeing her for an arterial study. While the wrap was being removed she had an  inadvertent laceration of the left proximal anterior leg which bled quite profusely and a compression dressing was applied over this and the patient was here to get her Profore wraps done. I was asked to see the patient to make an assessment and treat appropriately. 12/26/15; the patient's injury on the left proximal leg and Steri-Strips removed after soaking. There is an open area here. The original wounds to still open on the right lateral and left medial leg. 01/03/16 patient's injury on her proximal left leg looks quite good. Still small open area on the medial left leg which appears to be improving. The area on the right lateral leg still substantially open with no real improvement in wound depth. Her edema control is marginal with a Profore light. We have been using RTD for 3-4 weeks without any major change 01/10/16: wounds without s/s of infection. vascular results are pending regarding arterial studies. NIANI, MOURER ([id number]) 01/17/16; patient comes in today complaining of severe pain however I think most of this is in the right hip not related to her wounds. She continues with a oval-shaped wound on the right lateral leg, trauma to the left anterior leg just below her tibial plateau. She has a smaller eschar on the left anterior leg. She is being using Prisma however she informs [place] today that she is actually allergic to silver, nose this from a previous application at [facility] some years ago 01/24/16; edema is not so well controlled today, I think I reduced her to [place] bilaterally last week. The area which was a scissor injury on her left upper anterior lower leg is fully epithelialized. She only has a small open area remaining on the medial aspect of her left leg. The oval-shaped wound on the right mid lateral leg may be a bit smaller. Debrided of surface slough nonviable subcutaneous tissue. I had changed to collagen 2 weeks ago in an attempt to get this to close 01/31/16 all  the patient's wounds on her left leg give healed. We have good edema control with bilateral Profore lites which she has been compliant with. She still has the oval-shaped wound on the right lateral calf allergic even this appears to be slowly improving. The patient has juxtalite stockings at home. She states she thinks she can put these on. She also has external compression pumps at home although I think her compliance with this has not been good in the past. She complains today of edema in her thighs. Tells me she takes 40 mg of Lasix daily 02/21/16; only 1 small wound remains on the lateral aspect of the right calf. She is using Juzo stockings on the left leg although she complains about difficulty in applying them. She has external compression pumps at home 02/28/16; the small open wound on her right lateral calf is improved in terms of wound area. It appears that she has a wrap injury on the anterior aspect of the upper leg 03/06/16; the small open wound on her right lateral calf has a very small open area remaining. The wrap injury on the anterior aspect of the upper leg also appears better. She arrives today in clinic with a history of dyspnea with minimal exertion starting with the last 2-4 days. She does not describe chest pain. Her son and our intake nurse think she has facial swelling. She has a history of an artificial mitral valve on Coumadin  03/13/16; the small wound on the right lateral calf is no better this week. The superior wrap injury anteriorly requires debridement of surface eschar and nonviable subcutaneous tissue. She has been to her primary doctor with regards to her dyspnea we identified last week. Per the patient's description her Lasix has been increased 03/21/2016 -- patient of Dr. Dellia Nims who could not keep her appointment yesterday but has come in today with the right lower leg looking good and this is the leg which has been treated in the recent past. However, her left  lower extremity is extremely swollen, tender and edematous with redness and discoloration. 03/27/16; there is only 2 small areas remain on the right leg. The edema that was so concerning last week has come down however there is extensive bruising on the lateral left leg medial left foot suggesting that she lost some blood into the leg itself. I checked her hemoglobin on 9/21 was 14.5. Her INR was over 6. Duplex ultrasound was negative for DVT 04/03/16 at this point in time today patient is actually doing substantially better in regard to the wound on the anterior right lower extremity. currently there is no slough or eschar noted and no evidence of erythema, discharge, or local infection. She is exhibiting no signs of systemic infection. She is tolerating the dressing changes as well as the wrapping at this point in time. 04/24/16; I have not seen Mrs. Cavendish for 3 weeks. Apparently she was admitted to hospital for respiratory issues/also apparently has had multiple compression fractures and had kyphoplasty. When she was last here she only had one small open area remaining on the left leg she was using juxtalite stockings. Her son is upset about restarting the Coumadin which she blames or the swelling in her legs. She is on Coumadin for prophylaxis with a chronic artificial heart valve. 04/30/16 at this point in time patient has been tolerating the 3 layer compression wrap as well as the calcium alginate. 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 With tthat being said she does have the Treu, Laona. ([id number]) continued wound of the left lower extremity as well as the right medial lower extremity. The right side is significantly smaller compared to the left but actually is slough covered. fortunately she has no significant tenderness at rest although with manipulation of the right location of the wound this is significantly tender. 05/07/16 for follow-up evaluation today both the  patient's wounds appear to be significantly improved in size compared to last week. She is also having less pain at both locations currently. Her pain level at most is related to be a 1-2 out of 10. She does have some discharge but fortunately no evidence of infection at this point.she also continues to tolerate the compression wraps well. 05/14/16; the patient's wound on the left leg actually looks as though it's on its way to closure. She is using collagen to this area. She has a small punched out area over the right medial calf. The cause of this is not really clear it has probably 0.4 cm of depth. She has an IV in her right hand which she states is for IV fluid when she develops low sodiums her potassiums, the etiology of this is not clear 05-21-16 she presents today with continued ulcerations the right lower extremity, posterior aspect. she has been using Iodoflex and compression therapy to the area. She denies any new injuries or trauma. She does have 2 areas proximal to this ulceration of dry crust  along with an area to her left posterior lower leg that is purple discolored area appears similar to how her current ulceration started, she denies any trauma or injury to the left posterior leg we will monitor all of these areas. 05/28/16; the patient is down to 1 small open area on the right medial mid calf. This is however larger and in 50% of the surface area deeper approximately 0.4 cm. The reason for this deterioration is unclear. I've gone ahead and done a culture of this area she also tells [place] today that she is short of breath. She is also noted a swelling on her lower left eyelid 06/05/16; the patient is 1 small open area on the right medial mid calf. This looks about the same as last week. The deep area, medial 50% of the wound looks about the same. CandS I did last week was negative. The entire area looks about the same 06/12/16; not much change in this over the course of the last  week. Patient comes in today complaining of extreme back pain. Apparently she has either a kyphoplasty or vertebral plasty scheduled at [facility] radiology tomorrow. For this reason no debridement today. We have been using Iodoflex 06/19/16; patient arrived with the surface eschar from last week removed with a curet debrided of subcutaneous tissue. She tolerates this reasonably well. She is still in a skilled facility. States her vertebral plasty last week is made her pain bearable 06/26/16; patient arrived with a new wound on the posterior aspect of her left calf. She notices 3 or 4 days ago but is not certain how this happened. She states she simply felt a stinging sensation and one day on her foot and then the next day on the back of her calf. 06/28/2016 -- Ms. Reubin Milan called urgently saying that the pain in her left foot was drastically worse over the last several hours and though she removed her compression wrap she wanted to be seen before the long holiday. 07/03/16; she came in to see Dr. Con Memos on 12/29 for pain in her left foot/heel. Nothing much was found at the time and she states that the pain is better but involved her whole foot. She has a wound on the lateral aspect of the left calf and the medial aspect of the right. The medial 1 looks as if it is just about healed however the left one still has considerable edema. 07/09/16; she arrives in clinic today with a dime-sized wound on the left posterior calf and a small linear area on the right medial calf. The area on the left was debrided with a #3 curet nonviable subcutaneous tissue, we are not making progress here. The area on the right was also debrided of surface eschar and nonviable subcutaneous tissue this has some depth to it. 07/16/16; she has a dime size wound on the left posterior calf that has some depth. This required debridement last week, although I did not feel the need to do this today. We used Iodoflex last week.  Small area on the right also was not debrided. This looks like it may be close to closing 07/23/16; using Iodoflex to both wound areas. The area on the right posterior/medial calf is just about closed. She still has a fairly substantial wound on the left posterior leg which is still problematic. 07/30/16; the area on the posterior right leg is closed. The patient will graduate to her own pressure stocking. She has a punched-out area on the left mid calf.  The surface is better than last week. I did not attempt Neira, Elesia C. ([id number]) debridement. Continuing Iodoflex Is complaining about pain in the right lateral ribs this is not pleuritic. She apparently had a chest x-ray done at home the immobile X x-ray. I don't have access to that report. She is not complaining of inspiratory chest pain cough etc. 08/07/16; the posterior right leg wound is closed and she is wearing a compression stocking her son expresses satisfaction with her compliance with the stocking. He continues to have a fairly substantial punched-out circular area on the left posterior calf. This wound only came about in late December, she is not really sure how it happened. 08/13/16; no major change; small punched out wound on the left posterior calf. I have reviewed vascular hx. Followed by Dr. Ronalee Belts, not felt to have PAD 08/21/16; the area on her left posterior calf still a small punched out wound. Debrided this with a #3 curet of surface necrotic material however the wound continues to clean up quite nicely and I think the dimensions overall her smaller. We have been using Promogran. UNFORTUNATELY the patient arrives today with a fairly substantial skin tear on the right anterior lateral leg that was sustained when an aide was helping her put on her compression stockings. This happened 3 days ago. We will continue Promogran to all wound areas now including the right leg again. She is asked to keep the wraps that we put on in  place and we will discharge home health today at her request 08/28/16; the area on her left posterior calf is small punched out but appears to have some improvement still required debridement. The large skin tear on the right lateral leg looks healthy and is smaller no debridement is required. There is no evidence of surrounding cellulitis. Using Promogran on both areas 09/04/16; the area on the left posterior calf continues to gradually improve however the large more recent skin tear on her right lateral leg arrived with a large necrotic surface. We have been using Promogran and that was used to avoid adherence to the right leg wound. 3/1 14/18; the area on the left posterior calf continues to contract. Skin tear on her right lateral leg also looks a better this week. We've been using Promogran [allergic to Silver] 09/18/16; slight change in the left posterior calf. Skin tear on her right leg is down about 50%. We've been using Promogran Electronic Signature(s) Signed: 09/18/2016 4:37:19 PM By: [name] MD Entered By: [name] on 09/18/2016 14:02:25 Bo[place]hine, [name] ([id number]) -------------------------------------------------------------------------------- Physical Exam Details Patient Name: [name], [name] Date of Service: 09/18/2016 1:30 PM Medical Record Patient Account Number: [id number] Number: Treating RN: [name] 08/26/1934 (81 y.o. Other Clinician: Date of Birth/Sex: Female) Treating [name], [name] Primary Care Provider: [name] Provider/Extender: G Referring Provider: [name] in Treatment: 43 Constitutional Sitting or standing Blood Pressure is within target range for patient.. Pulse regular and within target range for patient.Marland Kitchen Respirations regular, non-labored and within target range.. Temperature is normal and within the target range for the patient.. Eyes Conjunctivae clear. No discharge.. Ears, Nose, Mouth, and  Throat Oropharynx within normal limits, without erythema, exudate or ulceration.Marland Kitchen Respiratory Respiratory effort is easy and symmetric bilaterally. Rate is normal at rest and on room air.. Cardiovascular Pedal pulses palpable and strong bilaterally.. Edema present in both extremities.. Lymphatic None palpable in the popliteal or inguinal area. Psychiatric No evidence of depression, anxiety, or agitation. Calm, cooperative, and communicative. Appropriate  interactions and affect.. Notes Wound exam; the original wound on the left posterior leg is small and slightly smaller. Surface had some slough although I did not go ahead with any debridement. oHer large skin tear on the right leg is down significantly advancing epithelialization. Electronic Signature(s) Signed: 09/18/2016 4:37:19 PM By: [name] MD Entered By: [name] on 09/18/2016 14:03:27 Stifter, [name] ([id number]) -------------------------------------------------------------------------------- Physician Orders Details Patient Name: [name]. Date of Service: 09/18/2016 1:30 PM Medical Record Patient Account Number: [id number] Number: Treating RN: [name] 07-21-1934 (81 y.o. Other Clinician: Date of Birth/Sex: Female) Treating [name], [place] Primary Care Provider: [name] Provider/Extender: G Referring Provider: [name] in Treatment: 64 Verbal / Phone Orders: Yes Clinician: Carolyne Fiscal, Debi Read Back and Verified: Yes Diagnosis Coding ICD-10 Coding Code Description [phone] Non-pressure chronic ulcer of other part of right lower leg with fat layer exposed Chronic venous hypertension (idiopathic) with ulcer and inflammation of bilateral lower I87.333 extremity I89.0 Lymphedema, not elsewhere classified X93.71 Chronic systolic (congestive) heart failure L97.221 Non-pressure chronic ulcer of left calf limited to breakdown of skin Wound Cleansing Wound #10  Right,Lateral Lower Leg o Clean wound with Normal Saline. o Cleanse wound with mild soap and water o May Shower, gently pat wound dry prior to applying new dressing. Wound #9 Left,Posterior Lower Leg o Clean wound with Normal Saline. o Cleanse wound with mild soap and water o May Shower, gently pat wound dry prior to applying new dressing. Anesthetic Wound #10 Right,Lateral Lower Leg o Topical Lidocaine 4% cream applied to wound bed prior to debridement - for clinic use Wound #9 Left,Posterior Lower Leg o Topical Lidocaine 4% cream applied to wound bed prior to debridement - for clinic use Primary Wound Dressing Wound #10 Right,Lateral Lower Leg o Promogran - plain collagen ****NO SILVER**** o Other: - mepitel Wound #9 Left,Posterior Lower Leg Tuman, Evagelia C. ([id number]) o Promogran - plain collagen ****NO SILVER**** o Other: - mepitel Secondary Dressing Wound #10 Right,Lateral Lower Leg o ABD pad o Dry Gauze Wound #9 Left,Posterior Lower Leg o ABD pad o Dry Gauze Dressing Change Frequency Wound #10 Right,Lateral Lower Leg o Change dressing every week - only at wound care center Wound #9 Left,Posterior Lower Leg o Change dressing every week - only at wound care center Follow-up Appointments Wound #10 Right,Lateral Lower Leg o Return Appointment in 1 week. Wound #9 Left,Posterior Lower Leg o Return Appointment in 1 week. Edema Control Wound #10 Right,Lateral Lower Leg o Kerlix and Coban - Bilateral o Elevate legs to the level of the heart and pump ankles as often as possible Wound #9 Left,Posterior Lower Leg o Kerlix and Coban - Bilateral o Elevate legs to the level of the heart and pump ankles as often as possible Additional Orders / Instructions Wound #10 Right,Lateral Lower Leg o Increase protein intake. Wound #9 Left,Posterior Lower Leg o Increase protein intake. Electronic Signature(s) Signed: 09/18/2016  4:37:19 PM By: [name] MD [name], [name] ([id number]) Signed: 09/18/2016 4:46:19 PM By: [name] Entered By: [name] on 09/18/2016 14:33:56 Chirco, [name] ([id number]) -------------------------------------------------------------------------------- Problem List Details Patient Name: [name], [name] Date of Service: 09/18/2016 1:30 PM Medical Record Patient Account Number: [id number] Number: Treating RN: [name] January 01, 1935 (81 y.o. Other Clinician: Date of Birth/Sex: Female) Treating [name], [name] Primary Care Provider: [name] Provider/Extender: G Referring Provider: [name] in Treatment: 43 Active Problems ICD-10 Encounter Code Description Active Date Diagnosis L97.812 Non-pressure chronic ulcer of other  part of right lower leg 04/30/2016 Yes with fat layer exposed I87.333 Chronic venous hypertension (idiopathic) with ulcer and 04/03/2016 Yes inflammation of bilateral lower extremity I89.0 Lymphedema, not elsewhere classified 04/03/2016 Yes E28.00 Chronic systolic (congestive) heart failure 04/03/2016 Yes L97.221 Non-pressure chronic ulcer of left calf limited to 07/03/2016 Yes breakdown of skin Inactive Problems Resolved Problems ICD-10 Code Description Active Date Resolved Date S81.812S Laceration without foreign body, left lower leg, sequela 04/03/2016 04/03/2016 Electronic Signature(s) Signed: 09/18/2016 4:37:19 PM By: [name] MD Entered By: [name] on 09/18/2016 14:01:17 Buttler, [name] ([id number]) Beaver, [place]. ([id number]) -------------------------------------------------------------------------------- Progress Note Details Patient Name: Carrie Mulligan C. Date of Service: 09/18/2016 1:30 PM Medical Record Patient Account Number: [id number] Number: Treating RN: [name] 05-Jun-1935 (81 y.o. Other Clinician: Date of Birth/Sex: Female) Treating [name],  [name] Primary Care Provider: [name] Provider/Extender: G Referring Provider: [name] in Treatment: 43 Subjective Chief Complaint Information obtained from Patient Patient here for reevaluation of her right lower externally ulcer History of Present Illness (HPI) 11/22/15; this is [place] is a 82 year old woman who lives at home on her own. According to the patient and her daughter was present she has had long-standing edema in her legs dating back many years. She also has a history of chronic systolic heart failure, atrial fibrillation and is status post mitral valve replacement. Last echocardiogram I see in [facility] showed an ejection fraction of 40-45% she is on Lasix 60 mg a day and spironolactone 25 mg a day. Her current problem began in December around Christmas time she developed a small hematoma in the medial part of her left leg which rapidly expanded to a very large hematoma that required surgical debridement. This situation was complicated by the fact that the patient is on long-standing Coumadin for mechanical heart valve. She went to the OR had this evacuated on January 4 /17. The wound has gradually improved however she has developed a small wounds around this area and more recently a wound on the right lateral leg. She is weeping edema fluid. The patient is already been to see vascular surgery. It was recommended that she wear Unna boots, she did not tolerate this due to pain in the left leg. She was then prescribed Juzo stockings and really can't get these on herself although truthfully there is probably too much edema for a Juzo stockings currently. She is not a diabetic and has no history of PAD or claudication that I could elicit. She does not use the external compression pumps reliably. She comes today with notes from her primary physician and Dr. Ronalee Belts both recommending various forms of compression but the patient does not really complied with  them. Has been using the external compression pumps with some regularity but certainly not daily on the right leg and this has helped. I also note that her daughter tells me the history that she does not sleep in bed at home. She has a hospital bed but with her legs up she finds this painful so she sleeps in the couch sitting up with her legs dependent. 11/29/15; the patient is arrives today accompanied by her son. He expresses satisfaction that she is maintained the compression all week. 12/06/15; the patient has kept her Profore light wraps on, we have good edema control no major change in the wounds we have been using Aquacel 12/13/15; changed to RTD last week. One of the 3 wounds on her left medial leg is healed she has 2 remaining  wounds here and one on the right lateral leg. 12/18/2015 -- the patient was at Dr. Nino Parsley office today and he was seeing her for an arterial study. While the wrap was being removed she had an inadvertent laceration of the left proximal anterior leg which bled quite profusely and a compression dressing was applied over this and the patient was here to get her Profore wraps done. I was asked to see the patient to make an assessment and treat appropriately. 12/26/15; the patient's injury on the left proximal leg and Steri-Strips removed after soaking. There is an open area here. The original wounds to still open on the right lateral and left medial leg. ANGELIAH, WISDOM ([id number]) 01/03/16 patient's injury on her proximal left leg looks quite good. Still small open area on the medial left leg which appears to be improving. The area on the right lateral leg still substantially open with no real improvement in wound depth. Her edema control is marginal with a Profore light. We have been using RTD for 3-4 weeks without any major change 01/10/16: wounds without s/s of infection. vascular results are pending regarding arterial studies. 01/17/16; patient comes in today  complaining of severe pain however I think most of this is in the right hip not related to her wounds. She continues with a oval-shaped wound on the right lateral leg, trauma to the left anterior leg just below her tibial plateau. She has a smaller eschar on the left anterior leg. She is being using Prisma however she informs [place] today that she is actually allergic to silver, nose this from a previous application at [facility] some years ago 01/24/16; edema is not so well controlled today, I think I reduced her to [place] bilaterally last week. The area which was a scissor injury on her left upper anterior lower leg is fully epithelialized. She only has a small open area remaining on the medial aspect of her left leg. The oval-shaped wound on the right mid lateral leg may be a bit smaller. Debrided of surface slough nonviable subcutaneous tissue. I had changed to collagen 2 weeks ago in an attempt to get this to close 01/31/16 all the patient's wounds on her left leg give healed. We have good edema control with bilateral Profore lites which she has been compliant with. She still has the oval-shaped wound on the right lateral calf allergic even this appears to be slowly improving. The patient has juxtalite stockings at home. She states she thinks she can put these on. She also has external compression pumps at home although I think her compliance with this has not been good in the past. She complains today of edema in her thighs. Tells me she takes 40 mg of Lasix daily 02/21/16; only 1 small wound remains on the lateral aspect of the right calf. She is using Juzo stockings on the left leg although she complains about difficulty in applying them. She has external compression pumps at home 02/28/16; the small open wound on her right lateral calf is improved in terms of wound area. It appears that she has a wrap injury on the anterior aspect of the upper leg 03/06/16; the small open wound on her right  lateral calf has a very small open area remaining. The wrap injury on the anterior aspect of the upper leg also appears better. She arrives today in clinic with a history of dyspnea with minimal exertion starting with the last 2-4 days. She does not describe chest pain. Her  son and our intake nurse think she has facial swelling. She has a history of an artificial mitral valve on Coumadin 03/13/16; the small wound on the right lateral calf is no better this week. The superior wrap injury anteriorly requires debridement of surface eschar and nonviable subcutaneous tissue. She has been to her primary doctor with regards to her dyspnea we identified last week. Per the patient's description her Lasix has been increased 03/21/2016 -- patient of Dr. Dellia Nims who could not keep her appointment yesterday but has come in today with the right lower leg looking good and this is the leg which has been treated in the recent past. However, her left lower extremity is extremely swollen, tender and edematous with redness and discoloration. 03/27/16; there is only 2 small areas remain on the right leg. The edema that was so concerning last week has come down however there is extensive bruising on the lateral left leg medial left foot suggesting that she lost some blood into the leg itself. I checked her hemoglobin on 9/21 was 14.5. Her INR was over 6. Duplex ultrasound was negative for DVT 04/03/16 at this point in time today patient is actually doing substantially better in regard to the wound on the anterior right lower extremity. currently there is no slough or eschar noted and no evidence of erythema, discharge, or local infection. She is exhibiting no signs of systemic infection. She is tolerating the dressing changes as well as the wrapping at this point in time. 04/24/16; I have not seen Mrs. Neu for 3 weeks. Apparently she was admitted to hospital for respiratory issues/also apparently has had multiple  compression fractures and had kyphoplasty. When she was last here she only had one small open area remaining on the left leg she was using juxtalite stockings. Her son [name], [name] ([id number]) is upset about restarting the Coumadin which she blames or the swelling in her legs. She is on Coumadin for prophylaxis with a chronic artificial heart valve. 04/30/16 at this point in time patient has been tolerating the 3 layer compression wrap as well as the calcium alginate. 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 With tthat being said she does have the continued wound of the left lower extremity as well as the right medial lower extremity. The right side is significantly smaller compared to the left but actually is slough covered. fortunately she has no significant tenderness at rest although with manipulation of the right location of the wound this is significantly tender. 05/07/16 for follow-up evaluation today both the patient's wounds appear to be significantly improved in size compared to last week. She is also having less pain at both locations currently. Her pain level at most is related to be a 1-2 out of 10. She does have some discharge but fortunately no evidence of infection at this point.she also continues to tolerate the compression wraps well. 05/14/16; the patient's wound on the left leg actually looks as though it's on its way to closure. She is using collagen to this area. She has a small punched out area over the right medial calf. The cause of this is not really clear it has probably 0.4 cm of depth. She has an IV in her right hand which she states is for IV fluid when she develops low sodiums her potassiums, the etiology of this is not clear 05-21-16 she presents today with continued ulcerations the right lower extremity, posterior aspect. she has been using Iodoflex and compression therapy to the  area. She denies any new injuries or trauma. She does have 2 areas  proximal to this ulceration of dry crust along with an area to her left posterior lower leg that is purple discolored area appears similar to how her current ulceration started, she denies any trauma or injury to the left posterior leg we will monitor all of these areas. 05/28/16; the patient is down to 1 small open area on the right medial mid calf. This is however larger and in 50% of the surface area deeper approximately 0.4 cm. The reason for this deterioration is unclear. I've gone ahead and done a culture of this area she also tells [place] today that she is short of breath. She is also noted a swelling on her lower left eyelid 06/05/16; the patient is 1 small open area on the right medial mid calf. This looks about the same as last week. The deep area, medial 50% of the wound looks about the same. CandS I did last week was negative. The entire area looks about the same 06/12/16; not much change in this over the course of the last week. Patient comes in today complaining of extreme back pain. Apparently she has either a kyphoplasty or vertebral plasty scheduled at [facility] radiology tomorrow. For this reason no debridement today. We have been using Iodoflex 06/19/16; patient arrived with the surface eschar from last week removed with a curet debrided of subcutaneous tissue. She tolerates this reasonably well. She is still in a skilled facility. States her vertebral plasty last week is made her pain bearable 06/26/16; patient arrived with a new wound on the posterior aspect of her left calf. She notices 3 or 4 days ago but is not certain how this happened. She states she simply felt a stinging sensation and one day on her foot and then the next day on the back of her calf. 06/28/2016 -- Ms. Reubin Milan called urgently saying that the pain in her left foot was drastically worse over the last several hours and though she removed her compression wrap she wanted to be seen before the  long holiday. 07/03/16; she came in to see Dr. Con Memos on 12/29 for pain in her left foot/heel. Nothing much was found at the time and she states that the pain is better but involved her whole foot. She has a wound on the lateral aspect of the left calf and the medial aspect of the right. The medial 1 looks as if it is just about healed however the left one still has considerable edema. 07/09/16; she arrives in clinic today with a dime-sized wound on the left posterior calf and a small linear area on the right medial calf. The area on the left was debrided with a #3 curet nonviable subcutaneous tissue, we are not making progress here. The area on the right was also debrided of surface eschar and nonviable subcutaneous tissue this has some depth to it. 07/16/16; she has a dime size wound on the left posterior calf that has some depth. This required debridement last week, although I did not feel the need to do this today. We used Iodoflex last week. Small [name], [name] ([id number]) area on the right also was not debrided. This looks like it may be close to closing 07/23/16; using Iodoflex to both wound areas. The area on the right posterior/medial calf is just about closed. She still has a fairly substantial wound on the left posterior leg which is still problematic. 07/30/16; the area on the  posterior right leg is closed. The patient will graduate to her own pressure stocking. She has a punched-out area on the left mid calf. The surface is better than last week. I did not attempt debridement. Continuing Iodoflex Is complaining about pain in the right lateral ribs this is not pleuritic. She apparently had a chest x-ray done at home the immobile X x-ray. I don't have access to that report. She is not complaining of inspiratory chest pain cough etc. 08/07/16; the posterior right leg wound is closed and she is wearing a compression stocking her son expresses satisfaction with her compliance with the  stocking. He continues to have a fairly substantial punched-out circular area on the left posterior calf. This wound only came about in late December, she is not really sure how it happened. 08/13/16; no major change; small punched out wound on the left posterior calf. I have reviewed vascular hx. Followed by Dr. Ronalee Belts, not felt to have PAD 08/21/16; the area on her left posterior calf still a small punched out wound. Debrided this with a #3 curet of surface necrotic material however the wound continues to clean up quite nicely and I think the dimensions overall her smaller. We have been using Promogran. UNFORTUNATELY the patient arrives today with a fairly substantial skin tear on the right anterior lateral leg that was sustained when an aide was helping her put on her compression stockings. This happened 3 days ago. We will continue Promogran to all wound areas now including the right leg again. She is asked to keep the wraps that we put on in place and we will discharge home health today at her request 08/28/16; the area on her left posterior calf is small punched out but appears to have some improvement still required debridement. The large skin tear on the right lateral leg looks healthy and is smaller no debridement is required. There is no evidence of surrounding cellulitis. Using Promogran on both areas 09/04/16; the area on the left posterior calf continues to gradually improve however the large more recent skin tear on her right lateral leg arrived with a large necrotic surface. We have been using Promogran and that was used to avoid adherence to the right leg wound. 3/1 14/18; the area on the left posterior calf continues to contract. Skin tear on her right lateral leg also looks a better this week. We've been using Promogran [allergic to Silver] 09/18/16; slight change in the left posterior calf. Skin tear on her right leg is down about 50%. We've been using  Promogran Objective Constitutional Sitting or standing Blood Pressure is within target range for patient.. Pulse regular and within target range for patient.Marland Kitchen Respirations regular, non-labored and within target range.. Temperature is normal and within the target range for the patient.. Vitals Time Taken: 1:34 PM, Height: 68 in, Weight: 178 lbs, BMI: 27.1, Temperature: 97.7 F, Pulse: 65 bpm, Respiratory Rate: 16 breaths/min, Blood Pressure: 127/58 mmHg. Borromeo, Jonte C. ([id number]) Eyes Conjunctivae clear. No discharge.. Ears, Nose, Mouth, and Throat Oropharynx within normal limits, without erythema, exudate or ulceration.Marland Kitchen Respiratory Respiratory effort is easy and symmetric bilaterally. Rate is normal at rest and on room air.. Cardiovascular Pedal pulses palpable and strong bilaterally.. Edema present in both extremities.. Lymphatic None palpable in the popliteal or inguinal area. Psychiatric No evidence of depression, anxiety, or agitation. Calm, cooperative, and communicative. Appropriate interactions and affect.. General Notes: Wound exam; the original wound on the left posterior leg is small and slightly smaller. Surface had some  slough although I did not go ahead with any debridement. Her large skin tear on the right leg is down significantly advancing epithelialization. Integumentary (Hair, Skin) Wound #10 status is Open. Original cause of wound was Shear/Friction. The wound is located on the Right,Lateral Lower Leg. The wound measures 1cm length x 2.6cm width x 0.1cm depth; 2.042cm^2 area and 0.204cm^3 volume. There is no tunneling or undermining noted. There is a large amount of serosanguineous drainage noted. The wound margin is distinct with the outline attached to the wound base. There is large (67-100%) red granulation within the wound bed. There is a small (1-33%) amount of necrotic tissue within the wound bed including Adherent Slough. Periwound temperature was  noted as No Abnormality. The periwound has tenderness on palpation. Wound #9 status is Open. Original cause of wound was Gradually Appeared. The wound is located on the Left,Posterior Lower Leg. The wound measures 0.9cm length x 0.6cm width x 0.1cm depth; 0.424cm^2 area and 0.042cm^3 volume. The wound is limited to skin breakdown. There is no tunneling or undermining noted. There is a large amount of serosanguineous drainage noted. The wound margin is flat and intact. There is large (67-100%) red granulation within the wound bed. There is a small (1-33%) amount of necrotic tissue within the wound bed including Adherent Slough. The periwound skin appearance exhibited: Ecchymosis. Periwound temperature was noted as No Abnormality. The periwound has tenderness on palpation. Assessment Active Problems [name], [name]. ([id number]) ICD-10 [phone] - Non-pressure chronic ulcer of other part of right lower leg with fat layer exposed I87.333 - Chronic venous hypertension (idiopathic) with ulcer and inflammation of bilateral lower extremity I89.0 - Lymphedema, not elsewhere classified Q68.34 - Chronic systolic (congestive) heart failure L97.221 - Non-pressure chronic ulcer of left calf limited to breakdown of skin Plan #1 we will continue with Promogran to both wound sites #2 the small wound on the left posterior calf has some surface to it, careful attention next week to the dimensions here and if this is not smaller she will need to surface debrided. #3 the patient has a prior history with juxtalite stockings however this was at a time were legs were much more edematous and her son is quite convinced that she'll need a new hair, we will attempt to order these. She has wounds on both her legs therefore insurance should cover this Electronic Signature(s) Signed: 09/18/2016 4:37:19 PM By: [name] MD Entered By: [name] on 09/18/2016 14:04:30 Urquilla, [name]  ([id number]) -------------------------------------------------------------------------------- SuperBill Details Patient Name: Carrie Mulligan C. Date of Service: 09/18/2016 Medical Record Patient Account Number: [id number] Number: Treating RN: [name] 12-08-1934 (81 y.o. Other Clinician: Date of Birth/Sex: Female) Treating [name], [place] Primary Care Provider: [name] Provider/Extender: G Referring Provider: [name] Service Line: Outpatient Weeks in Treatment: 43 Diagnosis Coding ICD-10 Codes Code Description [phone] Non-pressure chronic ulcer of other part of right lower leg with fat layer exposed Chronic venous hypertension (idiopathic) with ulcer and inflammation of bilateral lower I87.333 extremity I89.0 Lymphedema, not elsewhere classified Y18.56 Chronic systolic (congestive) heart failure L97.221 Non-pressure chronic ulcer of left calf limited to breakdown of skin Facility Procedures CPT4 Code: [id number] Description: 99214 - WOUND CARE VISIT-LEV 4 EST PT Modifier: Quantity: 1 Physician Procedures CPT4: Description Modifier Quantity Code [id number] - WC PHYS LEVEL 3 - EST PT 1 ICD-10 Description Diagnosis L97.812 Non-pressure chronic ulcer of other part of right lower leg with fat layer exposed I87.333 Chronic venous hypertension (idiopathic)  with ulcer and inflammation  of bilateral lower extremity Electronic Signature(s) Signed: 09/18/2016 4:37:19 PM By: [name] MD Signed: 09/18/2016 4:46:19 PM By: [name] Entered By: [name] on 09/18/2016 14:32:34

## 2016-09-25 ENCOUNTER — Encounter: Payer: Medicare Other | Admitting: Internal Medicine

## 2016-09-25 DIAGNOSIS — I87333 Chronic venous hypertension (idiopathic) with ulcer and inflammation of bilateral lower extremity: Secondary | ICD-10-CM | POA: Diagnosis not present

## 2016-09-27 NOTE — Progress Notes (Signed)
Carrie, [name] ([id number]) Visit Report for 09/25/2016 Chief Complaint Document Details Patient Name: [name], [name]. Date of Service: 09/25/2016 1:30 PM Medical Record Patient Account Number: [id number] Number: Treating RN: [name] 08-Jun-1935 (82 y.o. Other Clinician: Date of Birth/Sex: Female) Treating [name], [name] Primary Care Provider: [name] Provider/Extender: G Referring Provider: [name] in Treatment: 44 Information Obtained from: Patient Chief Complaint Patient here for reevaluation of her right lower externally ulcer Electronic Signature(s) Signed: 09/26/2016 5:50:35 AM By: [name] MD Entered By: [name] on 09/25/2016 14:27:44 Cange, [name] ([id number]) -------------------------------------------------------------------------------- Debridement Details Patient Name: Carrie Mulligan C. Date of Service: 09/25/2016 1:30 PM Medical Record Patient Account Number: [id number] Number: Treating RN: [name] 06-Nov-1934 (82 y.o. Other Clinician: Date of Birth/Sex: Female) Treating [name], [name] Primary Care Provider: [name] Provider/Extender: G Referring Provider: [name] in Treatment: 44 Debridement Performed for Wound #9 Left,Posterior Lower Leg Assessment: Performed By: Physician [name], MD Debridement: Debridement Pre-procedure Yes - 14:08 Verification/Time Out Taken: Start Time: 14:09 Pain Control: Lidocaine 4% Topical Solution Level: Skin/Subcutaneous Tissue Total Area Debrided (L x 0.9 (cm) x 0.6 (cm) = 0.54 (cm) W): Tissue and other Viable, Non-Viable, Exudate, Fibrin/Slough, Subcutaneous material debrided: Instrument: Curette Bleeding: Minimum Hemostasis Achieved: Pressure End Time: 14:10 Procedural Pain: 0 Post Procedural Pain: 0 Response to Treatment: Procedure was tolerated well Post Debridement Measurements of Total Wound Length:  (cm) 0.9 Width: (cm) 0.6 Depth: (cm) 0.1 Volume: (cm) 0.042 Character of Wound/Ulcer Post Requires Further Debridement Debridement: Severity of Tissue Post Debridement: Fat layer exposed Post Procedure Diagnosis Same as Pre-procedure Electronic Signature(s) Signed: 09/25/2016 5:03:01 PM By: [name] Signed: 09/26/2016 5:50:35 AM By: [name] MD BRACHA, FRANKOWSKI ([id number]) Entered By: [name] on 09/25/2016 14:27:33 Sinor, [name] ([id number]) -------------------------------------------------------------------------------- HPI Details Patient Name: Carrie Mulligan C. Date of Service: 09/25/2016 1:30 PM Medical Record Patient Account Number: [id number] Number: Treating RN: [name] [birth year] (82 y.o. Other Clinician: Date of Birth/Sex: Female) Treating [name], [name] Primary Care Provider: [name] Provider/Extender: G Referring Provider: [name] in Treatment: 44 History of Present Illness HPI Description: 11/22/15; this is Lemarr is a 82 year old woman who lives at home on her own. According to the patient and her daughter was present she has had long-standing edema in her legs dating back many years. She also has a history of chronic systolic heart failure, atrial fibrillation and is status post mitral valve replacement. Last echocardiogram I see [facility] showed an ejection fraction of 40-45% she is on Lasix 60 mg a day and spironolactone 25 mg a day. Her current problem began in December around Christmas time she developed a small hematoma in the medial part of her left leg which rapidly expanded to a very large hematoma that required surgical debridement. This situation was complicated by the fact that the patient is on long-standing Coumadin for mechanical heart valve. She went to the OR had this evacuated on January 4 /17. The wound has gradually improved however she has developed a small wounds  around this area and more recently a wound on the right lateral leg. She is weeping edema fluid. The patient is already been to see vascular surgery. It was recommended that she wear Unna boots, she did not tolerate this due to pain in the left leg. She was then prescribed Juzo stockings and really can't get these on herself although truthfully there is probably too much edema for a Juzo stockings  currently. She is not a diabetic and has no history of PAD or claudication that I could elicit. She does not use the external compression pumps reliably. She comes today with notes from her primary physician and Dr. Ronalee Belts both recommending various forms of compression but the patient does not really complied with them. Has been using the external compression pumps with some regularity but certainly not daily on the right leg and this has helped. I also note that her daughter tells me the history that she does not sleep in bed at home. She has a hospital bed but with her legs up she finds this painful so she sleeps in the couch sitting up with her legs dependent. 11/29/15; the patient is arrives today accompanied by her son. He expresses satisfaction that she is maintained the compression all week. 12/06/15; the patient has kept her Profore light wraps on, we have good edema control no major change in the wounds we have been using Aquacel 12/13/15; changed to RTD last week. One of the 3 wounds on her left medial leg is healed she has 2 remaining wounds here and one on the right lateral leg. 12/18/2015 -- the patient was at Dr. Nino Parsley office today and he was seeing her for an arterial study. While the wrap was being removed she had an inadvertent laceration of the left proximal anterior leg which bled quite profusely and a compression dressing was applied over this and the patient was here to get her Profore wraps done. I was asked to see the patient to make an assessment and treat  appropriately. 12/26/15; the patient's injury on the left proximal leg and Steri-Strips removed after soaking. There is an open area here. The original wounds to still open on the right lateral and left medial leg. 01/03/16 patient's injury on her proximal left leg looks quite good. Still small open area on the medial left leg which appears to be improving. The area on the right lateral leg still substantially open with no real improvement in wound depth. Her edema control is marginal with a Profore light. We have been using RTD for 3-4 weeks without any major change 01/10/16: wounds without s/s of infection. vascular results are pending regarding arterial studies. REONA, ZENDEJAS ([id number]) 01/17/16; patient comes in today complaining of severe pain however I think most of this is in the right hip not related to her wounds. She continues with a oval-shaped wound on the right lateral leg, trauma to the left anterior leg just below her tibial plateau. She has a smaller eschar on the left anterior leg. She is being using Prisma however she informs [place] today that she is actually allergic to silver, nose this from a previous application at [facility] some years ago 01/24/16; edema is not so well controlled today, I think I reduced her to [place] bilaterally last week. The area which was a scissor injury on her left upper anterior lower leg is fully epithelialized. She only has a small open area remaining on the medial aspect of her left leg. The oval-shaped wound on the right mid lateral leg may be a bit smaller. Debrided of surface slough nonviable subcutaneous tissue. I had changed to collagen 2 weeks ago in an attempt to get this to close 01/31/16 all the patient's wounds on her left leg give healed. We have good edema control with bilateral Profore lites which she has been compliant with. She still has the oval-shaped wound on the right lateral calf allergic even  this appears to be slowly  improving. The patient has juxtalite stockings at home. She states she thinks she can put these on. She also has external compression pumps at home although I think her compliance with this has not been good in the past. She complains today of edema in her thighs. Tells me she takes 40 mg of Lasix daily 02/21/16; only 1 small wound remains on the lateral aspect of the right calf. She is using Juzo stockings on the left leg although she complains about difficulty in applying them. She has external compression pumps at home 02/28/16; the small open wound on her right lateral calf is improved in terms of wound area. It appears that she has a wrap injury on the anterior aspect of the upper leg 03/06/16; the small open wound on her right lateral calf has a very small open area remaining. The wrap injury on the anterior aspect of the upper leg also appears better. She arrives today in clinic with a history of dyspnea with minimal exertion starting with the last 2-4 days. She does not describe chest pain. Her son and our intake nurse think she has facial swelling. She has a history of an artificial mitral valve on Coumadin 03/13/16; the small wound on the right lateral calf is no better this week. The superior wrap injury anteriorly requires debridement of surface eschar and nonviable subcutaneous tissue. She has been to her primary doctor with regards to her dyspnea we identified last week. Per the patient's description her Lasix has been increased 03/21/2016 -- patient of Dr. Dellia Nims who could not keep her appointment yesterday but has come in today with the right lower leg looking good and this is the leg which has been treated in the recent past. However, her left lower extremity is extremely swollen, tender and edematous with redness and discoloration. 03/27/16; there is only 2 small areas remain on the right leg. The edema that was so concerning last week has come down however there is extensive  bruising on the lateral left leg medial left foot suggesting that she lost some blood into the leg itself. I checked her hemoglobin on 9/21 was 14.5. Her INR was over 6. Duplex ultrasound was negative for DVT 04/03/16 at this point in time today patient is actually doing substantially better in regard to the wound on the anterior right lower extremity. currently there is no slough or eschar noted and no evidence of erythema, discharge, or local infection. She is exhibiting no signs of systemic infection. She is tolerating the dressing changes as well as the wrapping at this point in time. 04/24/16; I have not seen Mrs. Boggess for 3 weeks. Apparently she was admitted to hospital for respiratory issues/also apparently has had multiple compression fractures and had kyphoplasty. When she was last here she only had one small open area remaining on the left leg she was using juxtalite stockings. Her son is upset about restarting the Coumadin which she blames or the swelling in her legs. She is on Coumadin for prophylaxis with a chronic artificial heart valve. 04/30/16 at this point in time patient has been tolerating the 3 layer compression wrap as well as the calcium alginate. 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 With tthat being said she does have the [place]zee, [place]. ([id number]) continued wound of the left lower extremity as well as the right medial lower extremity. The right side is significantly smaller compared to the left but actually is slough covered. fortunately she  has no significant tenderness at rest although with manipulation of the right location of the wound this is significantly tender. 05/07/16 for follow-up evaluation today both the patient's wounds appear to be significantly improved in size compared to last week. She is also having less pain at both locations currently. Her pain level at most is related to be a 1-2 out of 10. She does have some discharge but fortunately  no evidence of infection at this point.she also continues to tolerate the compression wraps well. 05/14/16; the patient's wound on the left leg actually looks as though it's on its way to closure. She is using collagen to this area. She has a small punched out area over the right medial calf. The cause of this is not really clear it has probably 0.4 cm of depth. She has an IV in her right hand which she states is for IV fluid when she develops low sodiums her potassiums, the etiology of this is not clear 05-21-16 she presents today with continued ulcerations the right lower extremity, posterior aspect. she has been using Iodoflex and compression therapy to the area. She denies any new injuries or trauma. She does have 2 areas proximal to this ulceration of dry crust along with an area to her left posterior lower leg that is purple discolored area appears similar to how her current ulceration started, she denies any trauma or injury to the left posterior leg we will monitor all of these areas. 05/28/16; the patient is down to 1 small open area on the right medial mid calf. This is however larger and in 50% of the surface area deeper approximately 0.4 cm. The reason for this deterioration is unclear. I've gone ahead and done a culture of this area she also tells [place] today that she is short of breath. She is also noted a swelling on her lower left eyelid 06/05/16; the patient is 1 small open area on the right medial mid calf. This looks about the same as last week. The deep area, medial 50% of the wound looks about the same. CandS I did last week was negative. The entire area looks about the same 06/12/16; not much change in this over the course of the last week. Patient comes in today complaining of extreme back pain. Apparently she has either a kyphoplasty or vertebral plasty scheduled at [facility] radiology tomorrow. For this reason no debridement today. We have been using Iodoflex 06/19/16;  patient arrived with the surface eschar from last week removed with a curet debrided of subcutaneous tissue. She tolerates this reasonably well. She is still in a skilled facility. States her vertebral plasty last week is made her pain bearable 06/26/16; patient arrived with a new wound on the posterior aspect of her left calf. She notices 3 or 4 days ago but is not certain how this happened. She states she simply felt a stinging sensation and one day on her foot and then the next day on the back of her calf. 06/28/2016 -- Ms. Reubin Milan called urgently saying that the pain in her left foot was drastically worse over the last several hours and though she removed her compression wrap she wanted to be seen before the long holiday. 07/03/16; she came in to see Dr. Con Memos on 12/29 for pain in her left foot/heel. Nothing much was found at the time and she states that the pain is better but involved her whole foot. She has a wound on the lateral aspect of the left calf  and the medial aspect of the right. The medial 1 looks as if it is just about healed however the left one still has considerable edema. 07/09/16; she arrives in clinic today with a dime-sized wound on the left posterior calf and a small linear area on the right medial calf. The area on the left was debrided with a #3 curet nonviable subcutaneous tissue, we are not making progress here. The area on the right was also debrided of surface eschar and nonviable subcutaneous tissue this has some depth to it. 07/16/16; she has a dime size wound on the left posterior calf that has some depth. This required debridement last week, although I did not feel the need to do this today. We used Iodoflex last week. Small area on the right also was not debrided. This looks like it may be close to closing 07/23/16; using Iodoflex to both wound areas. The area on the right posterior/medial calf is just about closed. She still has a fairly substantial wound on the  left posterior leg which is still problematic. 07/30/16; the area on the posterior right leg is closed. The patient will graduate to her own pressure stocking. She has a punched-out area on the left mid calf. The surface is better than last week. I did not attempt Nong, Mckenna C. ([id number]) debridement. Continuing Iodoflex Is complaining about pain in the right lateral ribs this is not pleuritic. She apparently had a chest x-ray done at home the immobile X x-ray. I don't have access to that report. She is not complaining of inspiratory chest pain cough etc. 08/07/16; the posterior right leg wound is closed and she is wearing a compression stocking her son expresses satisfaction with her compliance with the stocking. He continues to have a fairly substantial punched-out circular area on the left posterior calf. This wound only came about in late December, she is not really sure how it happened. 08/13/16; no major change; small punched out wound on the left posterior calf. I have reviewed vascular hx. Followed by Dr. Ronalee Belts, not felt to have PAD 08/21/16; the area on her left posterior calf still a small punched out wound. Debrided this with a #3 curet of surface necrotic material however the wound continues to clean up quite nicely and I think the dimensions overall her smaller. We have been using Promogran. UNFORTUNATELY the patient arrives today with a fairly substantial skin tear on the right anterior lateral leg that was sustained when an aide was helping her put on her compression stockings. This happened 3 days ago. We will continue Promogran to all wound areas now including the right leg again. She is asked to keep the wraps that we put on in place and we will discharge home health today at her request 08/28/16; the area on her left posterior calf is small punched out but appears to have some improvement still required debridement. The large skin tear on the right lateral leg looks  healthy and is smaller no debridement is required. There is no evidence of surrounding cellulitis. Using Promogran on both areas 09/04/16; the area on the left posterior calf continues to gradually improve however the large more recent skin tear on her right lateral leg arrived with a large necrotic surface. We have been using Promogran and that was used to avoid adherence to the right leg wound. 3/1 14/18; the area on the left posterior calf continues to contract. Skin tear on her right lateral leg also looks a better this week. We've been  using Promogran [allergic to Silver] 09/18/16; slight change in the left posterior calf. Skin tear on her right leg is down about 50%. We've been using Promogran 09/25/16; improvement in the right posterior laceration wound. This looks like it is progressing towards closure. We are using Promogran here. No change in the small punched out wound on the posterior left calf. Also using Promogran under bilateral compression Electronic Signature(s) Signed: 09/26/2016 5:50:35 AM By: [name] MD Entered By: [name] on 09/25/2016 14:28:33 Gains, [name] ([id number]) -------------------------------------------------------------------------------- Physical Exam Details Patient Name: DUTTA, Lyndal C. Date of Service: 09/25/2016 1:30 PM Medical Record Patient Account Number: [id number] Number: Treating RN: [name] 03/28/1935 (81 y.o. Other Clinician: Date of Birth/Sex: Female) Treating [name], [name] Primary Care Provider: [name] Provider/Extender: G Referring Provider: [name] in Treatment: 44 Constitutional Sitting or standing Blood Pressure is within target range for patient.. Pulse regular and within target range for patient.Marland Kitchen Respirations regular, non-labored and within target range.. Temperature is normal and within the target range for the patient.. Patient's appearance is neat and clean. Appears in  no acute distress. Well nourished and well developed.. Notes Wound exam; the original wound on the left posterior leg about the same size as last week. Using a #3 curet I removed nonviable surface material. Hemostasis with direct pressure. This was largely done because of non-progression towards closure. oThe area on the right anterior leg which was originally a skin tear looks like it is progressing towards closure. There was no need for debridement in this area lineousing Promogran on both sites Electronic Signature(s) Signed: 09/26/2016 5:50:35 AM By: [name] MD Entered By: [name] on 09/25/2016 14:29:38 Weimer, [name] ([id number]) -------------------------------------------------------------------------------- Physician Orders Details Patient Name: Carrie Mulligan C. Date of Service: 09/25/2016 1:30 PM Medical Record Patient Account Number: [id number] Number: Treating RN: [name] [birth year] (81 y.o. Other Clinician: Date of Birth/Sex: Female) Treating [name], [name] Primary Care Provider: [name] Provider/Extender: G Referring Provider: [name] in Treatment: 42 Verbal / Phone Orders: Yes Clinician: Carolyne Fiscal, Debi Read Back and Verified: Yes Diagnosis Coding Wound Cleansing Wound #10 Right,Lateral Lower Leg o Clean wound with Normal Saline. o Cleanse wound with mild soap and water o May Shower, gently pat wound dry prior to applying new dressing. Wound #9 Left,Posterior Lower Leg o Clean wound with Normal Saline. o Cleanse wound with mild soap and water o May Shower, gently pat wound dry prior to applying new dressing. Anesthetic Wound #10 Right,Lateral Lower Leg o Topical Lidocaine 4% cream applied to wound bed prior to debridement - for clinic use Wound #9 Left,Posterior Lower Leg o Topical Lidocaine 4% cream applied to wound bed prior to debridement - for clinic use Primary Wound Dressing Wound  #10 Right,Lateral Lower Leg o Promogran - plain collagen ****NO SILVER**** o Other: - mepitel Wound #9 Left,Posterior Lower Leg o Promogran - plain collagen ****NO SILVER**** o Other: - mepitel Secondary Dressing Wound #10 Right,Lateral Lower Leg o ABD pad o Dry Gauze Wound #9 Left,Posterior Lower Leg Marcoe, Yarlin C. ([id number]) o ABD pad o Dry Gauze Dressing Change Frequency Wound #10 Right,Lateral Lower Leg o Change dressing every week - only at wound care center Wound #9 Left,Posterior Lower Leg o Change dressing every week - only at wound care center Follow-up Appointments Wound #10 Right,Lateral Lower Leg o Return Appointment in 1 week. Wound #9 Left,Posterior Lower Leg o Return Appointment in 1 week. Edema Control Wound #10 Right,Lateral Lower Leg o Kerlix  and Coban - Bilateral o Elevate legs to the level of the heart and pump ankles as often as possible Wound #9 Left,Posterior Lower Leg o Kerlix and Coban - Bilateral o Elevate legs to the level of the heart and pump ankles as often as possible Additional Orders / Instructions Wound #10 Right,Lateral Lower Leg o Increase protein intake. Wound #9 Left,Posterior Lower Leg o Increase protein intake. Electronic Signature(s) Signed: 09/25/2016 5:03:01 PM By: [name] Signed: 09/26/2016 5:50:35 AM By: [name] MD Entered By: [name] on 09/25/2016 14:12:20 Mcquary, [name] ([id number]) -------------------------------------------------------------------------------- Problem List Details Patient Name: LARETTA, PYATT C. Date of Service: 09/25/2016 1:30 PM Medical Record Patient Account Number: [id number] Number: Treating RN: [name] 1934/09/15 (81 y.o. Other Clinician: Date of Birth/Sex: Female) Treating [name], [name] Primary Care Provider: [name] Provider/Extender: G Referring Provider: [name] in Treatment:  44 Active Problems ICD-10 Encounter Code Description Active Date Diagnosis L97.812 Non-pressure chronic ulcer of other part of right lower leg 04/30/2016 Yes with fat layer exposed I87.333 Chronic venous hypertension (idiopathic) with ulcer and 04/03/2016 Yes inflammation of bilateral lower extremity I89.0 Lymphedema, not elsewhere classified 04/03/2016 Yes K93.26 Chronic systolic (congestive) heart failure 04/03/2016 Yes L97.221 Non-pressure chronic ulcer of left calf limited to 07/03/2016 Yes breakdown of skin Inactive Problems Resolved Problems ICD-10 Code Description Active Date Resolved Date S81.812S Laceration without foreign body, left lower leg, sequela 04/03/2016 04/03/2016 Electronic Signature(s) Signed: 09/26/2016 5:50:35 AM By: [name] MD Entered By: [name] on 09/25/2016 14:27:00 Vorhees, [name] ([id number]) 68, [place]. ([id number]) -------------------------------------------------------------------------------- Progress Note Details Patient Name: Carrie Mulligan C. Date of Service: 09/25/2016 1:30 PM Medical Record Patient Account Number: [id number] Number: Treating RN: [name] [birth year] (81 y.o. Other Clinician: Date of Birth/Sex: Female) Treating [name], [name] Primary Care Provider: [name] Provider/Extender: G Referring Provider: [name] in Treatment: 44 Subjective Chief Complaint Information obtained from Patient Patient here for reevaluation of her right lower externally ulcer History of Present Illness (HPI) 11/22/15; this is Schwarz is a 82 year old woman who lives at home on her own. According to the patient and her daughter was present she has had long-standing edema in her legs dating back many years. She also has a history of chronic systolic heart failure, atrial fibrillation and is status post mitral valve replacement. Last echocardiogram I see in [facility] showed an ejection  fraction of 40-45% she is on Lasix 60 mg a day and spironolactone 25 mg a day. Her current problem began in December around Christmas time she developed a small hematoma in the medial part of her left leg which rapidly expanded to a very large hematoma that required surgical debridement. This situation was complicated by the fact that the patient is on long-standing Coumadin for mechanical heart valve. She went to the OR had this evacuated on January 4 /17. The wound has gradually improved however she has developed a small wounds around this area and more recently a wound on the right lateral leg. She is weeping edema fluid. The patient is already been to see vascular surgery. It was recommended that she wear Unna boots, she did not tolerate this due to pain in the left leg. She was then prescribed Juzo stockings and really can't get these on herself although truthfully there is probably too much edema for a Juzo stockings currently. She is not a diabetic and has no history of PAD or claudication that I could elicit. She does not use the external  compression pumps reliably. She comes today with notes from her primary physician and Dr. Ronalee Belts both recommending various forms of compression but the patient does not really complied with them. Has been using the external compression pumps with some regularity but certainly not daily on the right leg and this has helped. I also note that her daughter tells me the history that she does not sleep in bed at home. She has a hospital bed but with her legs up she finds this painful so she sleeps in the couch sitting up with her legs dependent. 11/29/15; the patient is arrives today accompanied by her son. He expresses satisfaction that she is maintained the compression all week. 12/06/15; the patient has kept her Profore light wraps on, we have good edema control no major change in the wounds we have been using Aquacel 12/13/15; changed to RTD last week. One of  the 3 wounds on her left medial leg is healed she has 2 remaining wounds here and one on the right lateral leg. 12/18/2015 -- the patient was at Dr. Nino Parsley office today and he was seeing her for an arterial study. While the wrap was being removed she had an inadvertent laceration of the left proximal anterior leg which bled quite profusely and a compression dressing was applied over this and the patient was here to get her Profore wraps done. I was asked to see the patient to make an assessment and treat appropriately. 12/26/15; the patient's injury on the left proximal leg and Steri-Strips removed after soaking. There is an open area here. The original wounds to still open on the right lateral and left medial leg. ROGER, FASNACHT ([id number]) 01/03/16 patient's injury on her proximal left leg looks quite good. Still small open area on the medial left leg which appears to be improving. The area on the right lateral leg still substantially open with no real improvement in wound depth. Her edema control is marginal with a Profore light. We have been using RTD for 3-4 weeks without any major change 01/10/16: wounds without s/s of infection. vascular results are pending regarding arterial studies. 01/17/16; patient comes in today complaining of severe pain however I think most of this is in the right hip not related to her wounds. She continues with a oval-shaped wound on the right lateral leg, trauma to the left anterior leg just below her tibial plateau. She has a smaller eschar on the left anterior leg. She is being using Prisma however she informs [place] today that she is actually allergic to silver, nose this from a previous application at [facility] some years ago 01/24/16; edema is not so well controlled today, I think I reduced her to [place] bilaterally last week. The area which was a scissor injury on her left upper anterior lower leg is fully epithelialized. She only has a small open area  remaining on the medial aspect of her left leg. The oval-shaped wound on the right mid lateral leg may be a bit smaller. Debrided of surface slough nonviable subcutaneous tissue. I had changed to collagen 2 weeks ago in an attempt to get this to close 01/31/16 all the patient's wounds on her left leg give healed. We have good edema control with bilateral Profore lites which she has been compliant with. She still has the oval-shaped wound on the right lateral calf allergic even this appears to be slowly improving. The patient has juxtalite stockings at home. She states she thinks she can put these on. She  also has external compression pumps at home although I think her compliance with this has not been good in the past. She complains today of edema in her thighs. Tells me she takes 40 mg of Lasix daily 02/21/16; only 1 small wound remains on the lateral aspect of the right calf. She is using Juzo stockings on the left leg although she complains about difficulty in applying them. She has external compression pumps at home 02/28/16; the small open wound on her right lateral calf is improved in terms of wound area. It appears that she has a wrap injury on the anterior aspect of the upper leg 03/06/16; the small open wound on her right lateral calf has a very small open area remaining. The wrap injury on the anterior aspect of the upper leg also appears better. She arrives today in clinic with a history of dyspnea with minimal exertion starting with the last 2-4 days. She does not describe chest pain. Her son and our intake nurse think she has facial swelling. She has a history of an artificial mitral valve on Coumadin 03/13/16; the small wound on the right lateral calf is no better this week. The superior wrap injury anteriorly requires debridement of surface eschar and nonviable subcutaneous tissue. She has been to her primary doctor with regards to her dyspnea we identified last week. Per the patient's  description her Lasix has been increased 03/21/2016 -- patient of Dr. Dellia Nims who could not keep her appointment yesterday but has come in today with the right lower leg looking good and this is the leg which has been treated in the recent past. However, her left lower extremity is extremely swollen, tender and edematous with redness and discoloration. 03/27/16; there is only 2 small areas remain on the right leg. The edema that was so concerning last week has come down however there is extensive bruising on the lateral left leg medial left foot suggesting that she lost some blood into the leg itself. I checked her hemoglobin on 9/21 was 14.5. Her INR was over 6. Duplex ultrasound was negative for DVT 04/03/16 at this point in time today patient is actually doing substantially better in regard to the wound on the anterior right lower extremity. currently there is no slough or eschar noted and no evidence of erythema, discharge, or local infection. She is exhibiting no signs of systemic infection. She is tolerating the dressing changes as well as the wrapping at this point in time. 04/24/16; I have not seen Mrs. Bulman for 3 weeks. Apparently she was admitted to hospital for respiratory issues/also apparently has had multiple compression fractures and had kyphoplasty. When she was last here she only had one small open area remaining on the left leg she was using juxtalite stockings. Her son TACHE, BOBST ([id number]) is upset about restarting the Coumadin which she blames or the swelling in her legs. She is on Coumadin for prophylaxis with a chronic artificial heart valve. 04/30/16 at this point in time patient has been tolerating the 3 layer compression wrap as well as the calcium alginate. 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 With tthat being said she does have the continued wound of the left lower extremity as well as the right medial lower extremity. The right side  is significantly smaller compared to the left but actually is slough covered. fortunately she has no significant tenderness at rest although with manipulation of the right location of the wound this is significantly tender. 05/07/16 for follow-up  evaluation today both the patient's wounds appear to be significantly improved in size compared to last week. She is also having less pain at both locations currently. Her pain level at most is related to be a 1-2 out of 10. She does have some discharge but fortunately no evidence of infection at this point.she also continues to tolerate the compression wraps well. 05/14/16; the patient's wound on the left leg actually looks as though it's on its way to closure. She is using collagen to this area. She has a small punched out area over the right medial calf. The cause of this is not really clear it has probably 0.4 cm of depth. She has an IV in her right hand which she states is for IV fluid when she develops low sodiums her potassiums, the etiology of this is not clear 05-21-16 she presents today with continued ulcerations the right lower extremity, posterior aspect. she has been using Iodoflex and compression therapy to the area. She denies any new injuries or trauma. She does have 2 areas proximal to this ulceration of dry crust along with an area to her left posterior lower leg that is purple discolored area appears similar to how her current ulceration started, she denies any trauma or injury to the left posterior leg we will monitor all of these areas. 05/28/16; the patient is down to 1 small open area on the right medial mid calf. This is however larger and in 50% of the surface area deeper approximately 0.4 cm. The reason for this deterioration is unclear. I've gone ahead and done a culture of this area she also tells [place] today that she is short of breath. She is also noted a swelling on her lower left eyelid 06/05/16; the patient is 1 small open  area on the right medial mid calf. This looks about the same as last week. The deep area, medial 50% of the wound looks about the same. CandS I did last week was negative. The entire area looks about the same 06/12/16; not much change in this over the course of the last week. Patient comes in today complaining of extreme back pain. Apparently she has either a kyphoplasty or vertebral plasty scheduled at [facility] radiology tomorrow. For this reason no debridement today. We have been using Iodoflex 06/19/16; patient arrived with the surface eschar from last week removed with a curet debrided of subcutaneous tissue. She tolerates this reasonably well. She is still in a skilled facility. States her vertebral plasty last week is made her pain bearable 06/26/16; patient arrived with a new wound on the posterior aspect of her left calf. She notices 3 or 4 days ago but is not certain how this happened. She states she simply felt a stinging sensation and one day on her foot and then the next day on the back of her calf. 06/28/2016 -- Ms. Reubin Milan called urgently saying that the pain in her left foot was drastically worse over the last several hours and though she removed her compression wrap she wanted to be seen before the long holiday. 07/03/16; she came in to see Dr. Con Memos on 12/29 for pain in her left foot/heel. Nothing much was found at the time and she states that the pain is better but involved her whole foot. She has a wound on the lateral aspect of the left calf and the medial aspect of the right. The medial 1 looks as if it is just about healed however the left one still has  considerable edema. 07/09/16; she arrives in clinic today with a dime-sized wound on the left posterior calf and a small linear area on the right medial calf. The area on the left was debrided with a #3 curet nonviable subcutaneous tissue, we are not making progress here. The area on the right was also debrided of surface  eschar and nonviable subcutaneous tissue this has some depth to it. 07/16/16; she has a dime size wound on the left posterior calf that has some depth. This required debridement last week, although I did not feel the need to do this today. We used Iodoflex last week. Small Louk, Akansha C. ([id number]) area on the right also was not debrided. This looks like it may be close to closing 07/23/16; using Iodoflex to both wound areas. The area on the right posterior/medial calf is just about closed. She still has a fairly substantial wound on the left posterior leg which is still problematic. 07/30/16; the area on the posterior right leg is closed. The patient will graduate to her own pressure stocking. She has a punched-out area on the left mid calf. The surface is better than last week. I did not attempt debridement. Continuing Iodoflex Is complaining about pain in the right lateral ribs this is not pleuritic. She apparently had a chest x-ray done at home the immobile X x-ray. I don't have access to that report. She is not complaining of inspiratory chest pain cough etc. 08/07/16; the posterior right leg wound is closed and she is wearing a compression stocking her son expresses satisfaction with her compliance with the stocking. He continues to have a fairly substantial punched-out circular area on the left posterior calf. This wound only came about in late December, she is not really sure how it happened. 08/13/16; no major change; small punched out wound on the left posterior calf. I have reviewed vascular hx. Followed by Dr. Ronalee Belts, not felt to have PAD 08/21/16; the area on her left posterior calf still a small punched out wound. Debrided this with a #3 curet of surface necrotic material however the wound continues to clean up quite nicely and I think the dimensions overall her smaller. We have been using Promogran. UNFORTUNATELY the patient arrives today with a fairly substantial skin tear on  the right anterior lateral leg that was sustained when an aide was helping her put on her compression stockings. This happened 3 days ago. We will continue Promogran to all wound areas now including the right leg again. She is asked to keep the wraps that we put on in place and we will discharge home health today at her request 08/28/16; the area on her left posterior calf is small punched out but appears to have some improvement still required debridement. The large skin tear on the right lateral leg looks healthy and is smaller no debridement is required. There is no evidence of surrounding cellulitis. Using Promogran on both areas 09/04/16; the area on the left posterior calf continues to gradually improve however the large more recent skin tear on her right lateral leg arrived with a large necrotic surface. We have been using Promogran and that was used to avoid adherence to the right leg wound. 3/1 14/18; the area on the left posterior calf continues to contract. Skin tear on her right lateral leg also looks a better this week. We've been using Promogran [allergic to Silver] 09/18/16; slight change in the left posterior calf. Skin tear on her right leg is down about 50%. We've  been using Promogran 09/25/16; improvement in the right posterior laceration wound. This looks like it is progressing towards closure. We are using Promogran here. No change in the small punched out wound on the posterior left calf. Also using Promogran under bilateral compression Objective Constitutional Sitting or standing Blood Pressure is within target range for patient.. Pulse regular and within target range for patient.Marland Kitchen Respirations regular, non-labored and within target range.. Temperature is normal and within the target range for the patient.. Patient's appearance is neat and clean. Appears in no acute distress. Well nourished and well developed.[name], Sheika Loletha Grayer ([id number]) Vitals Time Taken: 1:38 PM,  Height: 68 in, Weight: 178 lbs, BMI: 27.1, Temperature: 97.6 F, Pulse: 61 bpm, Respiratory Rate: 16 breaths/min, Blood Pressure: 129/58 mmHg. General Notes: Wound exam; the original wound on the left posterior leg about the same size as last week. Using a #3 curet I removed nonviable surface material. Hemostasis with direct pressure. This was largely done because of non-progression towards closure. The area on the right anterior leg which was originally a skin tear looks like it is progressing towards closure. There was no need for debridement in this area line using Promogran on both sites Integumentary (Hair, Skin) Wound #10 status is Open. Original cause of wound was Shear/Friction. The wound is located on the Right,Lateral Lower Leg. The wound measures 1cm length x 2cm width x 0.1cm depth; 1.571cm^2 area and 0.157cm^3 volume. There is no tunneling or undermining noted. There is a large amount of serosanguineous drainage noted. The wound margin is distinct with the outline attached to the wound base. There is large (67-100%) red granulation within the wound bed. There is a small (1-33%) amount of necrotic tissue within the wound bed including Adherent Slough. Periwound temperature was noted as No Abnormality. The periwound has tenderness on palpation. Wound #9 status is Open. Original cause of wound was Gradually Appeared. The wound is located on the Left,Posterior Lower Leg. The wound measures 0.9cm length x 0.6cm width x 0.1cm depth; 0.424cm^2 area and 0.042cm^3 volume. The wound is limited to skin breakdown. There is no tunneling or undermining noted. There is a large amount of serosanguineous drainage noted. The wound margin is flat and intact. There is large (67-100%) red granulation within the wound bed. There is a small (1-33%) amount of necrotic tissue within the wound bed including Adherent Slough. The periwound skin appearance exhibited: Ecchymosis. Periwound temperature was noted  as No Abnormality. The periwound has tenderness on palpation. Assessment Active Problems ICD-10 L97.812 - Non-pressure chronic ulcer of other part of right lower leg with fat layer exposed I87.333 - Chronic venous hypertension (idiopathic) with ulcer and inflammation of bilateral lower extremity I89.0 - Lymphedema, not elsewhere classified U27.25 - Chronic systolic (congestive) heart failure L97.221 - Non-pressure chronic ulcer of left calf limited to breakdown of skin Procedures Hodkinson, Jyll C. ([id number]) Wound #9 Wound #9 is a Venous Leg Ulcer located on the Left,Posterior Lower Leg . There was a Skin/Subcutaneous Tissue Debridement ([id number]) debridement with total area of 0.54 sq cm performed by [name], MD. with the following instrument(s): Curette to remove Viable and Non-Viable tissue/material including Exudate, Fibrin/Slough, and Subcutaneous after achieving pain control using Lidocaine 4% Topical Solution. A time out was conducted at 14:08, prior to the start of the procedure. A Minimum amount of bleeding was controlled with Pressure. The procedure was tolerated well with a pain level of 0 throughout and a pain level of 0 following the procedure. Post Debridement Measurements:  0.9cm length x 0.6cm width x 0.1cm depth; 0.042cm^3 volume. Character of Wound/Ulcer Post Debridement requires further debridement. Severity of Tissue Post Debridement is: Fat layer exposed. Post procedure Diagnosis Wound #9: Same as Pre-Procedure Plan Wound Cleansing: Wound #10 Right,Lateral Lower Leg: Clean wound with Normal Saline. Cleanse wound with mild soap and water May Shower, gently pat wound dry prior to applying new dressing. Wound #9 Left,Posterior Lower Leg: Clean wound with Normal Saline. Cleanse wound with mild soap and water May Shower, gently pat wound dry prior to applying new dressing. Anesthetic: Wound #10 Right,Lateral Lower Leg: Topical Lidocaine 4% cream  applied to wound bed prior to debridement - for clinic use Wound #9 Left,Posterior Lower Leg: Topical Lidocaine 4% cream applied to wound bed prior to debridement - for clinic use Primary Wound Dressing: Wound #10 Right,Lateral Lower Leg: Promogran - plain collagen ****NO SILVER**** Other: - mepitel Wound #9 Left,Posterior Lower Leg: Promogran - plain collagen ****NO SILVER**** Other: - mepitel Secondary Dressing: Wound #10 Right,Lateral Lower Leg: ABD pad Dry Gauze Wound #9 Left,Posterior Lower Leg: ABD pad Dry Gauze Dressing Change Frequency: Loeper, Kanoelani C. ([id number]) Wound #10 Right,Lateral Lower Leg: Change dressing every week - only at wound care center Wound #9 Left,Posterior Lower Leg: Change dressing every week - only at wound care center Follow-up Appointments: Wound #10 Right,Lateral Lower Leg: Return Appointment in 1 week. Wound #9 Left,Posterior Lower Leg: Return Appointment in 1 week. Edema Control: Wound #10 Right,Lateral Lower Leg: Kerlix and Coban - Bilateral Elevate legs to the level of the heart and pump ankles as often as possible Wound #9 Left,Posterior Lower Leg: Kerlix and Coban - Bilateral Elevate legs to the level of the heart and pump ankles as often as possible Additional Orders / Instructions: Wound #10 Right,Lateral Lower Leg: Increase protein intake. Wound #9 Left,Posterior Lower Leg: Increase protein intake. #1 Promogran to both wound areas Kerlix and Coban #2 review next week Electronic Signature(s) Signed: 09/26/2016 5:50:35 AM By: [name] MD Entered By: [name] on 09/25/2016 14:30:16 Cogdell, [name] ([id number]) -------------------------------------------------------------------------------- SuperBill Details Patient Name: Carrie Mulligan C. Date of Service: 09/25/2016 Medical Record Patient Account Number: [id number] Number: Treating RN: [name] 04-Sep-1934 (81 y.o. Other  Clinician: Date of Birth/Sex: Female) Treating [name], [name] Primary Care Provider: [name] Provider/Extender: G Referring Provider: [name] in Treatment: 44 Diagnosis Coding ICD-10 Codes Code Description [phone] Non-pressure chronic ulcer of other part of right lower leg with fat layer exposed Chronic venous hypertension (idiopathic) with ulcer and inflammation of bilateral lower I87.333 extremity I89.0 Lymphedema, not elsewhere classified I78.67 Chronic systolic (congestive) heart failure L97.221 Non-pressure chronic ulcer of left calf limited to breakdown of skin Facility Procedures CPT4: Description Modifier Quantity Code [id number] 11042 - DEB SUBQ TISSUE 20 SQ CM/< 1 ICD-10 Description Diagnosis I87.333 Chronic venous hypertension (idiopathic) with ulcer and inflammation of bilateral lower extremity L97.221 Non-pressure chronic  ulcer of left calf limited to breakdown of skin Physician Procedures CPT4: Description Modifier Quantity Code [id number] - WC PHYS SUBQ TISS 20 SQ CM 1 ICD-10 Description Diagnosis I87.333 Chronic venous hypertension (idiopathic) with ulcer and inflammation of bilateral lower extremity L97.221 Non-pressure chronic  ulcer of left calf limited to breakdown of skin Electronic Signature(s) Signed: 09/26/2016 5:50:35 AM By: [name] MD Entered By: [name] on 09/25/2016 14:30:44

## 2016-09-27 NOTE — Progress Notes (Signed)
RENE, [name] ([id number]) Visit Report for 09/25/2016 Arrival Information Details Patient Name: [name], [name]. Date of Service: 09/25/2016 1:30 PM Medical Record Patient Account Number: [id number] Number: Treating RN: [name] 1935-01-10 (82 y.o. Other Clinician: Date of Birth/Sex: Female) Treating [name], Clare Primary Care Arti Trang: [name] Porter Nakama/Extender: G Referring [name]: [name] in Treatment: 44 Visit Information History Since Last Visit All ordered tests and consults were completed: No Patient Arrived: [name] Added or deleted any medications: No Arrival Time: 13:37 Any new allergies or adverse reactions: No Accompanied By: daughter Had a fall or experienced change in No Transfer Assistance: EasyPivot Patient activities of daily living that may affect Lift risk of falls: Patient Identification Verified: Yes Signs or symptoms of abuse/neglect since last No Secondary Verification Process Yes visito Completed: Hospitalized since last visit: No Patient Requires Transmission- No Has Dressing in Place as Prescribed: Yes Based Precautions: Has Compression in Place as Prescribed: Yes Patient Has Alerts: Yes Pain Present Now: No Patient Alerts: Patient on Blood Thinner [place]!!! Electronic Signature(s) Signed: 09/25/2016 5:03:01 PM By: [name] Entered By: [name] on 09/25/2016 13:38:07 Kontos, [name] ([id number]) -------------------------------------------------------------------------------- Encounter Discharge Information Details Patient Name: Carrie Mulligan C. Date of Service: 09/25/2016 1:30 PM Medical Record Patient Account Number: [id number] Number: Treating RN: [name] 05-03-1935 (82 y.o. Other Clinician: Date of Birth/Sex: Female) Treating [name], [name] Primary Care Omari Koslosky: [name] Farra Nikolic/Extender: G Referring  Elliot Simoneaux: [name] in Treatment: 44 Encounter Discharge Information Items Discharge Pain Level: 0 Discharge Condition: Stable Ambulatory Status: Walker Discharge Destination: Home Transportation: Private Auto Accompanied By: daughter Schedule Follow-up Appointment: Yes Medication Reconciliation completed No and provided to Patient/Care Trinka Keshishyan: Provided on Clinical Summary of Care: 09/25/2016 Form Type Recipient Paper Patient [place] Electronic Signature(s) Signed: 09/25/2016 2:31:51 PM By: Ruthine Dose Entered By: Ruthine Dose on 09/25/2016 14:31:51 Durocher, [name] ([id number]) -------------------------------------------------------------------------------- Lower Extremity Assessment Details Patient Name: Carrie Mulligan C. Date of Service: 09/25/2016 1:30 PM Medical Record Patient Account Number: [id number] Number: Treating RN: [name] 06/14/1935 (82 y.o. Other Clinician: Date of Birth/Sex: Female) Treating [name], [place] Primary Care Renlee Floor: [name] Edris Friedt/Extender: G Referring Leili Eskenazi: [name] in Treatment: 44 Edema Assessment Assessed: [Left: No] [Right: No] E[Left: dema] [Right: :] Calf Left: Right: Point of Measurement: 34 cm From Medial Instep 30.2 cm 30 cm Ankle Left: Right: Point of Measurement: 10 cm From Medial Instep 20.6 cm 20.6 cm Vascular Assessment Pulses: Dorsalis Pedis Palpable: [Left:Yes] [Right:Yes] Posterior Tibial Extremity colors, hair growth, and conditions: Extremity Color: [Left:Hyperpigmented] [Right:Hyperpigmented] Temperature of Extremity: [Left:Warm] [Right:Warm] Capillary Refill: [Left:< 3 seconds] [Right:< 3 seconds] Electronic Signature(s) Signed: 09/25/2016 5:03:01 PM By: [name] Entered By: [name] on 09/25/2016 14:17:57 Pape, Senia C. ([id number]) -------------------------------------------------------------------------------- Multi Wound Chart  Details Patient Name: Carrie Mulligan C. Date of Service: 09/25/2016 1:30 PM Medical Record Patient Account Number: [id number] Number: Treating RN: [name] October 24, 1934 (82 y.o. Other Clinician: Date of Birth/Sex: Female) Treating [name], [name] Primary Care Armand Preast: [name] Autumn Pruitt/Extender: G Referring Laurencia Roma: [name] in Treatment: 44 Vital Signs Height(in): 68 Pulse(bpm): 61 Weight(lbs): 178 Blood Pressure 129/58 (mmHg): Body Mass Index(BMI): 27 Temperature(F): 97.6 Respiratory Rate 16 (breaths/min): Photos: [10:No Photos] [9:No Photos] [N/A:N/A] Wound Location: [10:Right Lower Leg - Lateral] [9:Left Lower Leg - Posterior] [N/A:N/A] Wounding Event: [10:Shear/Friction] [9:Gradually Appeared] [N/A:N/A] Primary Etiology: [10:Skin Tear] [9:Venous Leg Ulcer] [N/A:N/A] Comorbid History: [10:Cataracts, Arrhythmia, Hypotension, Osteoarthritis] [9:Cataracts, Arrhythmia, Hypotension, Osteoarthritis] [N/A:N/A] Date Acquired: [10:08/20/2016] [  9:06/26/2016] [N/A:N/A] Weeks of Treatment: [10:5] [9:13] [N/A:N/A] Wound Status: [10:Open] [9:Open] [N/A:N/A] Measurements L x W x D 1x2x0.1 [9:0.9x0.6x0.1] [N/A:N/A] (cm) Area (cm) : [10:1.571] [9:0.424] [N/A:N/A] Volume (cm) : [10:0.157] [9:0.042] [N/A:N/A] % Reduction in Area: [10:83.50%] [9:92.80%] [N/A:N/A] % Reduction in Volume: 83.50% [9:92.90%] [N/A:N/A] Classification: [10:Partial Thickness] [9:Full Thickness Without Exposed Support Structures] [N/A:N/A] Exudate Amount: [10:Large] [9:Large] [N/A:N/A] Exudate Type: [10:Serosanguineous] [9:Serosanguineous] [N/A:N/A] Exudate Color: [10:red, brown] [9:red, brown] [N/A:N/A] Wound Margin: [10:Distinct, outline attached Flat and Intact] [N/A:N/A] Granulation Amount: [10:Large (67-100%)] [9:Large (67-100%)] [N/A:N/A] Granulation Quality: [10:Red] [9:Red] [N/A:N/A] Necrotic Amount: [10:Small (1-33%)] [9:Small (1-33%)] [N/A:N/A] Epithelialization:  [10:None] [9:None] [N/A:N/A] Debridement: [10:N/A] [N/A:N/A] Debridement ([id number]) Pre-procedure N/A 14:08 N/A Verification/Time Out Taken: Pain Control: N/A Lidocaine 4% Topical N/A Solution Tissue Debrided: N/A Fibrin/Slough, Exudates, N/A Subcutaneous Level: N/A Skin/Subcutaneous N/A Tissue Debridement Area (sq N/A 0.54 N/A cm): Instrument: N/A Curette N/A Bleeding: N/A Minimum N/A Hemostasis Achieved: N/A Pressure N/A Procedural Pain: N/A 0 N/A Post Procedural Pain: N/A 0 N/A Debridement Treatment N/A Procedure was tolerated N/A Response: well Post Debridement N/A 0.9x0.6x0.1 N/A Measurements L x W x D (cm) Post Debridement N/A 0.042 N/A Volume: (cm) Periwound Skin Texture: No Abnormalities Noted No Abnormalities Noted N/A Periwound Skin No Abnormalities Noted No Abnormalities Noted N/A Moisture: Periwound Skin Color: No Abnormalities Noted Ecchymosis: Yes N/A Temperature: No Abnormality No Abnormality N/A Tenderness on Yes Yes N/A Palpation: Wound Preparation: Ulcer Cleansing: Ulcer Cleansing: N/A Rinsed/Irrigated with Rinsed/Irrigated with Saline Saline Topical Anesthetic Topical Anesthetic Applied: Other: lidocaine Applied: Other: lidociane 4% 4% Procedures Performed: N/A Debridement N/A Treatment Notes Wound #10 (Right, Lateral Lower Leg) 1. Cleansed with: Clean wound with Normal Saline Cleanse wound with antibacterial soap and water 2. Anesthetic Topical Lidocaine 4% cream to wound bed prior to debridement 4. Dressing Applied: CORDA, SHUTT ([id number]) Promogran 5. Secondary Dressing Applied ABD Pad Dry Gauze 7. Secured with Tape Notes kerlix, coban Wound #9 (Left, Posterior Lower Leg) 1. Cleansed with: Clean wound with Normal Saline Cleanse wound with antibacterial soap and water 2. Anesthetic Topical Lidocaine 4% cream to wound bed prior to debridement 4. Dressing Applied: Promogran 5. Secondary Dressing Applied ABD  Pad Dry Gauze 7. Secured with Tape Notes kerlix, coban Electronic Signature(s) Signed: 09/26/2016 5:50:35 AM By: [name] MD Entered By: [name] on 09/25/2016 14:27:06 Maslowski, [name] ([id number]) -------------------------------------------------------------------------------- Multi-Disciplinary Care Plan Details Patient Name: DESTANEY, SARKIS. Date of Service: 09/25/2016 1:30 PM Medical Record Patient Account Number: [id number] Number: Treating RN: [name] [birth year] (81 y.o. Other Clinician: Date of Birth/Sex: Female) Treating [name], [name] Primary Care Dezirae Service: [name] Shalece Staffa/Extender: G Referring Jylan Loeza: [name] in Treatment: 71 Active Inactive ` Orientation to the Wound Care Program Nursing Diagnoses: Knowledge deficit related to the wound healing center program Goals: Patient/caregiver will verbalize understanding of the [place] Date Initiated: 11/22/2015 Target Resolution Date: 09/28/2016 Goal Status: Active Interventions: Provide education on orientation to the wound center Notes: ` Venous Leg Ulcer Nursing Diagnoses: Knowledge deficit related to disease process and management Potential for venous Insuffiency (use before diagnosis confirmed) Goals: Patient will maintain optimal edema control Date Initiated: 11/22/2015 Target Resolution Date: 09/28/2016 Goal Status: Active Patient/caregiver will verbalize understanding of disease process and disease management Date Initiated: 11/22/2015 Target Resolution Date: 09/28/2016 Goal Status: Active Verify adequate tissue perfusion prior to therapeutic compression application Date Initiated: 11/22/2015 Target Resolution Date: 09/28/2016 Goal Status: Active Interventions: TONISHA, SILVEY ([id number]) Assess peripheral edema status every visit. Compression  as ordered Provide education on venous insufficiency Treatment  Activities: Non-invasive vascular studies : 11/22/2015 Therapeutic compression applied : 11/22/2015 Notes: ` Wound/Skin Impairment Nursing Diagnoses: Impaired tissue integrity Knowledge deficit related to ulceration/compromised skin integrity Goals: Patient/caregiver will verbalize understanding of skin care regimen Date Initiated: 11/22/2015 Target Resolution Date: 09/28/2016 Goal Status: Active Ulcer/skin breakdown will have a volume reduction of 30% by week 4 Date Initiated: 11/22/2015 Target Resolution Date: 09/28/2016 Goal Status: Active Ulcer/skin breakdown will have a volume reduction of 50% by week 8 Date Initiated: 11/22/2015 Target Resolution Date: 09/28/2016 Goal Status: Active Ulcer/skin breakdown will have a volume reduction of 80% by week 12 Date Initiated: 11/22/2015 Target Resolution Date: 09/28/2016 Goal Status: Active Ulcer/skin breakdown will heal within 14 weeks Date Initiated: 11/22/2015 Target Resolution Date: 09/28/2016 Goal Status: Active Interventions: Assess patient/caregiver ability to perform ulcer/skin care regimen upon admission and as needed Assess ulceration(s) every visit Provide education on ulcer and skin care Treatment Activities: Skin care regimen initiated : 11/22/2015 Topical wound management initiated : 11/22/2015 Notes: DEIDRE, CARINO ([id number]) Electronic Signature(s) Signed: 09/25/2016 5:03:01 PM By: [name] Entered By: [name] on 09/25/2016 13:54:37 Passow, Stacee Loletha Grayer ([id number]) -------------------------------------------------------------------------------- Pain Assessment Details Patient Name: Carrie Mulligan C. Date of Service: 09/25/2016 1:30 PM Medical Record Patient Account Number: [id number] Number: Treating RN: [name] May 28, 1935 (81 y.o. Other Clinician: Date of Birth/Sex: Female) Treating [name], [name] Primary Care Araya Roel: [name] Pattricia Weiher/Extender: G Referring  Carlene Bickley: [name] in Treatment: 44 Active Problems Location of Pain Severity and Description of Pain Patient Has Paino No Site Locations With Dressing Change: No Pain Management and Medication Current Pain Management: Electronic Signature(s) Signed: 09/25/2016 5:03:01 PM By: [name] Entered By: [name] on 09/25/2016 13:38:14 Trivett, [name] ([id number]) -------------------------------------------------------------------------------- Patient/Caregiver Education Details Patient Name: [name] Date of Service: 09/25/2016 1:30 PM Medical Record Patient Account Number: [id number] Number: Treating RN: [name] 1934/08/03 (81 y.o. Other Clinician: Date of Birth/Gender: Female) Treating [name], [name] Primary Care Physician/Extender: Claudette Laws Physician: Suella [place] in Treatment: 44 Referring Physician: [name] Education Assessment Education Provided To: Patient Education Topics Provided Wound/Skin Impairment: Handouts: Other: do not get wraps wet Methods: Demonstration, Explain/Verbal Responses: State content correctly Electronic Signature(s) Signed: 09/25/2016 5:03:01 PM By: [name] Entered By: [name] on 09/25/2016 14:14:12 Tessler, Page. ([id number]) -------------------------------------------------------------------------------- Wound Assessment Details Patient Name: Carrie Mulligan C. Date of Service: 09/25/2016 1:30 PM Medical Record Patient Account Number: [id number] Number: Treating RN: [name] 1934-09-29 (81 y.o. Other Clinician: Date of Birth/Sex: Female) Treating [name], [place] Primary Care Cleta Heatley: [name] Shion Bluestein/Extender: G Referring Netty Sullivant: [name] in Treatment: 44 Wound Status Wound Number: 10 Primary Skin Tear Etiology: Wound Location: Right Lower Leg - Lateral Wound Status: Open Wounding Event: Shear/Friction Comorbid  Cataracts, Arrhythmia, Hypotension, Date Acquired: 08/20/2016 History: Osteoarthritis Weeks Of Treatment: 5 Clustered Wound: No Wound Measurements Length: (cm) 1 Width: (cm) 2 Depth: (cm) 0.1 Area: (cm) 1.571 Volume: (cm) 0.157 % Reduction in Area: 83.5% % Reduction in Volume: 83.5% Epithelialization: None Tunneling: No Undermining: No Wound Description Classification: Partial Thickness Wound Margin: Distinct, outline attached Exudate Amount: Large Exudate Type: Serosanguineous Exudate Color: red, brown Foul Odor After Cleansing: No Slough/Fibrino No Wound Bed Granulation Amount: Large (67-100%) Granulation Quality: Red Necrotic Amount: Small (1-33%) Necrotic Quality: Adherent Slough Periwound Skin Texture Texture Color No Abnormalities Noted: No No Abnormalities Noted: No Moisture Temperature / Pain No Abnormalities Noted: No Temperature: No Abnormality Tenderness on Palpation:  Yes Wound Preparation Ulcer Cleansing: Rinsed/Irrigated with Saline Oesterle, Shirel C. ([id number]) Topical Anesthetic Applied: Other: lidocaine 4%, Treatment Notes Wound #10 (Right, Lateral Lower Leg) 1. Cleansed with: Clean wound with Normal Saline Cleanse wound with antibacterial soap and water 2. Anesthetic Topical Lidocaine 4% cream to wound bed prior to debridement 4. Dressing Applied: Promogran 5. Secondary Dressing Applied ABD Pad Dry Gauze 7. Secured with Tape Notes kerlix, coban Electronic Signature(s) Signed: 09/25/2016 5:03:01 PM By: [name] Entered By: [name] on 09/25/2016 13:51:40 Azzaro, [name] ([id number]) -------------------------------------------------------------------------------- Wound Assessment Details Patient Name: [name], Kesha C. Date of Service: 09/25/2016 1:30 PM Medical Record Patient Account Number: [id number] Number: Treating RN: [name] 1935/01/26 (81 y.o. Other Clinician: Date of  Birth/Sex: Female) Treating [name], [place] Primary Care Dail Lerew: [name] Fenton Candee/Extender: G Referring Ason Heslin: [name] in Treatment: 44 Wound Status Wound Number: 9 Primary Venous Leg Ulcer Etiology: Wound Location: Left Lower Leg - Posterior Wound Status: Open Wounding Event: Gradually Appeared Comorbid Cataracts, Arrhythmia, Hypotension, Date Acquired: 06/26/2016 History: Osteoarthritis Weeks Of Treatment: 13 Clustered Wound: No Wound Measurements Length: (cm) 0.9 Width: (cm) 0.6 Depth: (cm) 0.1 Area: (cm) 0.424 Volume: (cm) 0.042 % Reduction in Area: 92.8% % Reduction in Volume: 92.9% Epithelialization: None Tunneling: No Undermining: No Wound Description Full Thickness Without Exposed Classification: Support Structures Wound Margin: Flat and Intact Exudate Large Amount: Exudate Type: Serosanguineous Exudate Color: red, brown Foul Odor After Cleansing: No Wound Bed Granulation Amount: Large (67-100%) Exposed Structure Granulation Quality: Red Fascia Exposed: No Necrotic Amount: Small (1-33%) Fat Layer (Subcutaneous Tissue) Exposed: No Necrotic Quality: Adherent Slough Tendon Exposed: No Muscle Exposed: No Joint Exposed: No Bone Exposed: No Limited to Skin Breakdown Periwound Skin Texture Texture Color No Abnormalities Noted: No No Abnormalities Noted: No Stipp, Candelaria C. ([id number]) Moisture Ecchymosis: Yes No Abnormalities Noted: No Temperature / Pain Temperature: No Abnormality Tenderness on Palpation: Yes Wound Preparation Ulcer Cleansing: Rinsed/Irrigated with Saline Topical Anesthetic Applied: Other: lidociane 4%, Treatment Notes Wound #9 (Left, Posterior Lower Leg) 1. Cleansed with: Clean wound with Normal Saline Cleanse wound with antibacterial soap and water 2. Anesthetic Topical Lidocaine 4% cream to wound bed prior to debridement 4. Dressing Applied: Promogran 5. Secondary Dressing Applied ABD Pad Dry  Gauze 7. Secured with Tape Notes kerlix, coban Electronic Signature(s) Signed: 09/25/2016 5:03:01 PM By: [name] Entered By: [name] on 09/25/2016 13:52:15 Parsley, [name] ([id number]) -------------------------------------------------------------------------------- Vitals Details Patient Name: Carrie Mulligan C. Date of Service: 09/25/2016 1:30 PM Medical Record Patient Account Number: [id number] Number: Treating RN: [name] 1934/10/06 (81 y.o. Other Clinician: Date of Birth/Sex: Female) Treating [name], [name] Primary Care Kazuma Elena: [name] Masaichi Kracht/Extender: G Referring Mong Neal: [name] in Treatment: 44 Vital Signs Time Taken: 13:38 Temperature (F): 97.6 Height (in): 68 Pulse (bpm): 61 Weight (lbs): 178 Respiratory Rate (breaths/min): 16 Body Mass Index (BMI): 27.1 Blood Pressure (mmHg): 129/58 Reference Range: 80 - 120 mg / dl Electronic Signature(s) Signed: 09/25/2016 5:03:01 PM By: [name] Entered By: [name] on 09/25/2016 13:39:27

## 2016-10-03 ENCOUNTER — Encounter: Payer: Medicare Other | Attending: Surgery | Admitting: Surgery

## 2016-10-03 ENCOUNTER — Other Ambulatory Visit: Payer: Self-pay | Admitting: Internal Medicine

## 2016-10-03 ENCOUNTER — Ambulatory Visit (INDEPENDENT_AMBULATORY_CARE_PROVIDER_SITE_OTHER): Payer: Medicare Other | Admitting: Vascular Surgery

## 2016-10-03 DIAGNOSIS — Z79899 Other long term (current) drug therapy: Secondary | ICD-10-CM | POA: Diagnosis not present

## 2016-10-03 DIAGNOSIS — L97221 Non-pressure chronic ulcer of left calf limited to breakdown of skin: Secondary | ICD-10-CM | POA: Diagnosis not present

## 2016-10-03 DIAGNOSIS — I87333 Chronic venous hypertension (idiopathic) with ulcer and inflammation of bilateral lower extremity: Secondary | ICD-10-CM | POA: Diagnosis present

## 2016-10-03 DIAGNOSIS — Z7901 Long term (current) use of anticoagulants: Secondary | ICD-10-CM | POA: Insufficient documentation

## 2016-10-03 DIAGNOSIS — R1310 Dysphagia, unspecified: Secondary | ICD-10-CM

## 2016-10-03 DIAGNOSIS — I890 Lymphedema, not elsewhere classified: Secondary | ICD-10-CM | POA: Diagnosis not present

## 2016-10-03 DIAGNOSIS — I4891 Unspecified atrial fibrillation: Secondary | ICD-10-CM | POA: Diagnosis not present

## 2016-10-03 DIAGNOSIS — I5022 Chronic systolic (congestive) heart failure: Secondary | ICD-10-CM | POA: Diagnosis not present

## 2016-10-03 DIAGNOSIS — Z952 Presence of prosthetic heart valve: Secondary | ICD-10-CM | POA: Diagnosis not present

## 2016-10-03 DIAGNOSIS — L97812 Non-pressure chronic ulcer of other part of right lower leg with fat layer exposed: Secondary | ICD-10-CM | POA: Insufficient documentation

## 2016-10-04 NOTE — Progress Notes (Signed)
AHNESTY, FINFROCK ([id number]) Visit Report for 10/03/2016 Arrival Information Details Patient Name: [name], [name]. Date of Service: 10/03/2016 1:30 PM Medical Record Number: [id number] Patient Account Number: [id number] Date of Birth/Sex: [birth year] (81 y.o. Female) Treating RN: Carolyne Fiscal, Debi Primary Care Gaddiel Cullens: [name] Other Clinician: Referring Kendyl Bissonnette: [name] Treating Vasil Juhasz/Extender: Frann Rider in Treatment: 63 Visit Information History Since Last Visit All ordered tests and consults were completed: No Patient Arrived: [name] Added or deleted any medications: No Arrival Time: 13:35 Any new allergies or adverse reactions: No Accompanied By: son Had a fall or experienced change in No Transfer Assistance: EasyPivot Patient activities of daily living that may affect Lift risk of falls: Patient Identification Verified: Yes Signs or symptoms of abuse/neglect since last No Secondary Verification Process Yes visito Completed: Hospitalized since last visit: No Patient Requires Transmission- No Has Dressing in Place as Prescribed: Yes Based Precautions: Has Compression in Place as Prescribed: Yes Patient Has Alerts: Yes Pain Present Now: No Patient Alerts: Patient on Blood Thinner Ulen!!! Electronic Signature(s) Signed: 10/03/2016 5:04:07 PM By: [name] Entered By: [name] on 10/03/2016 13:37:21 Tabet, [name] ([id number]) -------------------------------------------------------------------------------- Encounter Discharge Information Details Patient Name: Carrie Mulligan C. Date of Service: 10/03/2016 1:30 PM Medical Record Number: [id number] Patient Account Number: [id number] Date of Birth/Sex: [birth year] (81 y.o. Female) Treating RN: Carolyne Fiscal, Debi Primary Care Shakea Isip: [name] Other Clinician: Referring Kahmari Herard: [name] Treating Darrelle Barrell/Extender: Frann Rider in Treatment: 61 Encounter Discharge Information Items Discharge Pain Level: 0 Discharge Condition: Stable Ambulatory Status: Walker Discharge Destination: Home Transportation: Private Auto Accompanied By: son Schedule Follow-up Appointment: Yes Medication Reconciliation completed No and provided to Patient/Care Kamaryn Grimley: Provided on Clinical Summary of Care: 10/03/2016 Form Type Recipient Paper Patient [place] Electronic Signature(s) Signed: 10/03/2016 2:33:35 PM By: Ruthine Dose Entered By: Ruthine Dose on 10/03/2016 14:33:35 Toscano, [name] ([id number]) -------------------------------------------------------------------------------- Lower Extremity Assessment Details Patient Name: Carrie Mulligan C. Date of Service: 10/03/2016 1:30 PM Medical Record Number: [id number] Patient Account Number: [id number] Date of Birth/Sex: [birth year] (81 y.o. Female) Treating RN: Carolyne Fiscal, Debi Primary Care Timathy Newberry: [name] Other Clinician: Referring Anola Mcgough: [name] Treating Aaron Boeh/Extender: Frann Rider in Treatment: 45 Edema Assessment Assessed: [Left: No] [Right: No] E[Left: dema] [Right: :] Calf Left: Right: Point of Measurement: 34 cm From Medial Instep 20.3 cm 30 cm Ankle Left: Right: Point of Measurement: 10 cm From Medial Instep 20.6 cm 20.6 cm Vascular Assessment Pulses: Dorsalis Pedis Palpable: [Left:Yes] [Right:Yes] Posterior Tibial Extremity colors, hair growth, and conditions: Extremity Color: [Left:Hyperpigmented] [Right:Hyperpigmented] Temperature of Extremity: [Left:Warm] [Right:Warm] Capillary Refill: [Left:< 3 seconds] [Right:< 3 seconds] Electronic Signature(s) Signed: 10/03/2016 5:04:07 PM By: [name] Entered By: [name] on 10/03/2016 13:52:36 Ahlin, Carrie CMarland Kitchen ([id number]) -------------------------------------------------------------------------------- Multi Wound Chart Details Patient Name: Carrie Mulligan C. Date of Service: 10/03/2016 1:30 PM Medical Record Number: [id number] Patient Account Number: [id number] Date of Birth/Sex: Jun 17, 1935 (81 y.o. Female) Treating RN: [name] Primary Care Bernadette Armijo: [name] Other Clinician: Referring Trevionne Advani: [name] Treating Aprile Dickenson/Extender: Frann Rider in Treatment: 45 Vital Signs Height(in): 68 Pulse(bpm): 68 Weight(lbs): 178 Blood Pressure 122/58 (mmHg): Body Mass Index(BMI): 27 Temperature(F): 97.6 Respiratory Rate 16 (breaths/min): Photos: [10:No Photos] [9:No Photos] [N/A:N/A] Wound Location: [10:Right Lower Leg - Lateral] [9:Left Lower Leg - Posterior] [N/A:N/A] Wounding Event: [10:Shear/Friction] [9:Gradually Appeared] [N/A:N/A] Primary Etiology: [10:Skin Tear] [9:Venous Leg Ulcer] [N/A:N/A] Comorbid History: [10:Cataracts, Arrhythmia, Hypotension, Osteoarthritis] [9:Cataracts, Arrhythmia, Hypotension, Osteoarthritis] [N/A:N/A] Date Acquired: [10:08/20/2016] [9:06/26/2016] [N/A:N/A] Weeks of  Treatment: [10:6] [9:14] [N/A:N/A] Wound Status: [10:Open] [9:Open] [N/A:N/A] Measurements L x W x D 0.5x0.2x0.1 [9:1x0.6x0.1] [N/A:N/A] (cm) Area (cm) : [10:0.079] [9:0.471] [N/A:N/A] Volume (cm) : [10:0.008] [9:0.047] [N/A:N/A] % Reduction in Area: [10:99.20%] [9:92.00%] [N/A:N/A] % Reduction in Volume: 99.20% [9:92.00%] [N/A:N/A] Classification: [10:Partial Thickness] [9:Full Thickness Without Exposed Support Structures] [N/A:N/A] Exudate Amount: [10:Large] [9:Large] [N/A:N/A] Exudate Type: [10:Serosanguineous] [9:Serosanguineous] [N/A:N/A] Exudate Color: [10:red, brown] [9:red, brown] [N/A:N/A] Wound Margin: [10:Distinct, outline attached Flat and Intact] [N/A:N/A] Granulation Amount: [10:Large (67-100%)] [9:Large (67-100%)] [N/A:N/A] Granulation Quality: [10:Red] [9:Red] [N/A:N/A] Necrotic Amount: [10:Small (1-33%)] [9:Small (1-33%)] [N/A:N/A] Epithelialization: [10:None] [9:None]  [N/A:N/A] Periwound Skin Texture: No Abnormalities Noted No Abnormalities Noted [N/A:N/A] Periwound Skin [10:No Abnormalities Noted No Abnormalities Noted] [N/A:N/A] Moisture: Slight, [name] ([id number]) Periwound Skin Color: No Abnormalities Noted Ecchymosis: Yes N/A Temperature: No Abnormality No Abnormality N/A Tenderness on Yes Yes N/A Palpation: Wound Preparation: Ulcer Cleansing: Ulcer Cleansing: N/A Rinsed/Irrigated with Rinsed/Irrigated with Saline Saline Topical Anesthetic Topical Anesthetic Applied: Other: lidocaine Applied: Other: lidociane 4% 4% Treatment Notes Wound #10 (Right, Lateral Lower Leg) 1. Cleansed with: Clean wound with Normal Saline Cleanse wound with antibacterial soap and water 2. Anesthetic Topical Lidocaine 4% cream to wound bed prior to debridement 4. Dressing Applied: Promogran 5. Secondary Dressing Applied ABD Pad Dry Gauze 7. Secured with Tape 3 Layer Compression System - Bilateral Wound #9 (Left, Posterior Lower Leg) 1. Cleansed with: Clean wound with Normal Saline Cleanse wound with antibacterial soap and water 2. Anesthetic Topical Lidocaine 4% cream to wound bed prior to debridement 4. Dressing Applied: Promogran 5. Secondary Dressing Applied ABD Pad Dry Gauze 7. Secured with Tape 3 Layer Compression System - Bilateral Electronic Signature(s) Signed: 10/03/2016 2:19:06 PM By: Christin Fudge MD, FACS Entered By: Christin Fudge on 10/03/2016 14:19:05 ALAISHA, EVERSLEY ([id number]) [place]USAL[place], [place] ([id number]) -------------------------------------------------------------------------------- [place] Details Patient Name: [name], [name]. Date of Service: 10/03/2016 1:30 PM Medical Record Number: [id number] Patient Account Number: [id number] Date of Birth/Sex: 29-Sep-1934 (81 y.o. Female) Treating RN: Carolyne Fiscal, Debi Primary Care Sarthak Rubenstein: [name] Other Clinician: Referring Suyash Amory: [name] Treating Jamise Pentland/Extender: Frann Rider in Treatment: 68 Active Inactive ` Orientation to the Wound Care Program Nursing Diagnoses: Knowledge deficit related to the wound healing center program Goals: Patient/caregiver will verbalize understanding of the [facility] Program Date Initiated: 11/22/2015 Target Resolution Date: 09/28/2016 Goal Status: Active Interventions: Provide education on orientation to the wound center Notes: ` Venous Leg Ulcer Nursing Diagnoses: Knowledge deficit related to disease process and management Potential for venous Insuffiency (use before diagnosis confirmed) Goals: Patient will maintain optimal edema control Date Initiated: 11/22/2015 Target Resolution Date: 09/28/2016 Goal Status: Active Patient/caregiver will verbalize understanding of disease process and disease management Date Initiated: 11/22/2015 Target Resolution Date: 09/28/2016 Goal Status: Active Verify adequate tissue perfusion prior to therapeutic compression application Date Initiated: 11/22/2015 Target Resolution Date: 09/28/2016 Goal Status: Active Interventions: Assess peripheral edema status every visit. Compression as ordered ALIXANDREA, MILLESON ([id number]) Provide education on venous insufficiency Treatment Activities: Non-invasive vascular studies : 11/22/2015 Therapeutic compression applied : 11/22/2015 Notes: ` Wound/Skin Impairment Nursing Diagnoses: Impaired tissue integrity Knowledge deficit related to ulceration/compromised skin integrity Goals: Patient/caregiver will verbalize understanding of skin care regimen Date Initiated: 11/22/2015 Target Resolution Date: 09/28/2016 Goal Status: Active Ulcer/skin breakdown will have a volume reduction of 30% by week 4 Date Initiated: 11/22/2015 Target Resolution Date: 09/28/2016 Goal Status: Active Ulcer/skin breakdown will have a volume reduction of 50% by week 8 Date Initiated: 11/22/2015  Target  Resolution Date: 09/28/2016 Goal Status: Active Ulcer/skin breakdown will have a volume reduction of 80% by week 12 Date Initiated: 11/22/2015 Target Resolution Date: 09/28/2016 Goal Status: Active Ulcer/skin breakdown will heal within 14 weeks Date Initiated: 11/22/2015 Target Resolution Date: 09/28/2016 Goal Status: Active Interventions: Assess patient/caregiver ability to perform ulcer/skin care regimen upon admission and as needed Assess ulceration(s) every visit Provide education on ulcer and skin care Treatment Activities: Skin care regimen initiated : 11/22/2015 Topical wound management initiated : 11/22/2015 Notes: Electronic Signature(s) Signed: 10/03/2016 5:04:07 PM By: De Burrs, [name] ([id number]) Entered By: [name] on 10/03/2016 13:52:48 Benard, Snyder. ([id number]) -------------------------------------------------------------------------------- Pain Assessment Details Patient Name: Carrie Mulligan C. Date of Service: 10/03/2016 1:30 PM Medical Record Number: [id number] Patient Account Number: [id number] Date of Birth/Sex: 03/28/1935 (81 y.o. Female) Treating RN: Carolyne Fiscal, Debi Primary Care Anddy Wingert: [name] Other Clinician: Referring Airyn Ellzey: [name] Treating Towanda Hornstein/Extender: Frann Rider in Treatment: 45 Active Problems Location of Pain Severity and Description of Pain Patient Has Paino No Site Locations With Dressing Change: No Pain Management and Medication Current Pain Management: Electronic Signature(s) Signed: 10/03/2016 5:04:07 PM By: [name] Entered By: [name] on 10/03/2016 13:37:29 Cottingham, [name] ([id number]) -------------------------------------------------------------------------------- Patient/Caregiver Education Details Patient Name: [name]. Date of Service: 10/03/2016 1:30 PM Medical Record Number: [id number] Patient Account Number: [id number] Date of  Birth/Gender: 02-13-1935 (81 y.o. Female) Treating RN: [name] Primary Care Physician: [name] Other Clinician: Referring Physician: [name] Treating Physician/Extender: Frann Rider in Treatment: 26 Education Assessment Education Provided To: Patient Education Topics Provided Wound/Skin Impairment: Handouts: Other: change dressing as ordered Methods: Demonstration, Explain/Verbal Responses: State content correctly Electronic Signature(s) Signed: 10/03/2016 5:04:07 PM By: [name] Entered By: [name] on 10/03/2016 14:04:18 Clowers, [name] ([id number]) -------------------------------------------------------------------------------- Wound Assessment Details Patient Name: Carrie Mulligan C. Date of Service: 10/03/2016 1:30 PM Medical Record Number: [id number] Patient Account Number: [id number] Date of Birth/Sex: 04-05-1935 (81 y.o. Female) Treating RN: Carolyne Fiscal, Debi Primary Care Ramah Langhans: [name] Other Clinician: Referring Minette Manders: [name] Treating Nomie Buchberger/Extender: Frann Rider in Treatment: 45 Wound Status Wound Number: 10 Primary Skin Tear Etiology: Wound Location: Right Lower Leg - Lateral Wound Status: Open Wounding Event: Shear/Friction Comorbid Cataracts, Arrhythmia, Hypotension, Date Acquired: 08/20/2016 History: Osteoarthritis Weeks Of Treatment: 6 Clustered Wound: No Photos Photo Uploaded By: [name] on 10/03/2016 16:51:52 Wound Measurements Length: (cm) 0.5 Width: (cm) 0.2 Depth: (cm) 0.1 Area: (cm) 0.079 Volume: (cm) 0.008 % Reduction in Area: 99.2% % Reduction in Volume: 99.2% Epithelialization: None Tunneling: No Undermining: No Wound Description Classification: Partial Thickness Foul Odor Af Wound Margin: Distinct, outline attached Slough/Fibri Exudate Amount: Large Exudate Type: Serosanguineous Exudate Color: red, brown ter Cleansing: No no No Wound Bed Granulation  Amount: Large (67-100%) Granulation Quality: Red Necrotic Amount: Small (1-33%) Necrotic Quality: Adherent [address], [place] C. ([id number]) Periwound Skin Texture Texture Color No Abnormalities Noted: No No Abnormalities Noted: No Moisture Temperature / Pain No Abnormalities Noted: No Temperature: No Abnormality Tenderness on Palpation: Yes Wound Preparation Ulcer Cleansing: Rinsed/Irrigated with Saline Topical Anesthetic Applied: Other: lidocaine 4%, Treatment Notes Wound #10 (Right, Lateral Lower Leg) 1. Cleansed with: Clean wound with Normal Saline Cleanse wound with antibacterial soap and water 2. Anesthetic Topical Lidocaine 4% cream to wound bed prior to debridement 4. Dressing Applied: Promogran 5. Secondary Dressing Applied ABD Pad Dry Gauze 7. Secured with Tape 3 Layer Compression System - Bilateral Electronic Signature(s) Signed: 10/03/2016 5:04:07 PM By: [name]  Entered By: [name] on 10/03/2016 13:49:56 Proffit, Katilin Loletha Grayer ([id number]) -------------------------------------------------------------------------------- Wound Assessment Details Patient Name: AUBRIANA, RAVELO C. Date of Service: 10/03/2016 1:30 PM Medical Record Number: [id number] Patient Account Number: [id number] Date of Birth/Sex: 12/29/1934 (81 y.o. Female) Treating RN: Carolyne Fiscal, Debi Primary Care Zaul Hubers: [name] Other Clinician: Referring Margert Edsall: [name] Treating Lynn Recendiz/Extender: Frann Rider in Treatment: 5 Wound Status Wound Number: 9 Primary Venous Leg Ulcer Etiology: Wound Location: Left Lower Leg - Posterior Wound Status: Open Wounding Event: Gradually Appeared Comorbid Cataracts, Arrhythmia, Hypotension, Date Acquired: 06/26/2016 History: Osteoarthritis Weeks Of Treatment: 14 Clustered Wound: No Photos Photo Uploaded By: [name] on 10/03/2016 16:51:52 Wound Measurements Length: (cm) 1 Width: (cm) 0.6 Depth: (cm)  0.1 Area: (cm) 0.471 Volume: (cm) 0.047 % Reduction in Area: 92% % Reduction in Volume: 92% Epithelialization: None Tunneling: No Undermining: No Wound Description Full Thickness Without Exposed Classification: Support Structures Wound Margin: Flat and Intact Exudate Large Amount: Exudate Type: Serosanguineous Exudate Color: red, brown Foul Odor After Cleansing: No Wound Bed Granulation Amount: Large (67-100%) Exposed Structure Granulation Quality: Red Fascia Exposed: No Necrotic Amount: Small (1-33%) Fat Layer (Subcutaneous Tissue) Exposed: No Lanter, Marvella C. ([id number]) Necrotic Quality: Adherent Slough Tendon Exposed: No Muscle Exposed: No Joint Exposed: No Bone Exposed: No Limited to Skin Breakdown Periwound Skin Texture Texture Color No Abnormalities Noted: No No Abnormalities Noted: No Ecchymosis: Yes Moisture No Abnormalities Noted: No Temperature / Pain Temperature: No Abnormality Tenderness on Palpation: Yes Wound Preparation Ulcer Cleansing: Rinsed/Irrigated with Saline Topical Anesthetic Applied: Other: lidociane 4%, Treatment Notes Wound #9 (Left, Posterior Lower Leg) 1. Cleansed with: Clean wound with Normal Saline Cleanse wound with antibacterial soap and water 2. Anesthetic Topical Lidocaine 4% cream to wound bed prior to debridement 4. Dressing Applied: Promogran 5. Secondary Dressing Applied ABD Pad Dry Gauze 7. Secured with Tape 3 Layer Compression System - Bilateral Electronic Signature(s) Signed: 10/03/2016 5:04:07 PM By: [name] Entered By: [name] on 10/03/2016 13:50:41 Gerling, [name] ([id number]) -------------------------------------------------------------------------------- [place] Details Patient Name: Carrie Mulligan C. Date of Service: 10/03/2016 1:30 PM Medical Record Number: [id number] Patient Account Number: [id number] Date of Birth/Sex: July 06, 1934 (81 y.o. Female) Treating RN: Carolyne Fiscal,  Debi Primary Care Andri Prestia: [name] Other Clinician: Referring Vernesha Talbot: [name] Treating Sandi Towe/Extender: Frann Rider in Treatment: 45 Vital Signs Time Taken: 13:39 Temperature (F): 97.6 Height (in): 68 Pulse (bpm): 68 Weight (lbs): 178 Respiratory Rate (breaths/min): 16 Body Mass Index (BMI): 27.1 Blood Pressure (mmHg): 122/58 Reference Range: 80 - 120 mg / dl Electronic Signature(s) Signed: 10/03/2016 5:04:07 PM By: [name] Entered By: [name] on 10/03/2016 13:40:35

## 2016-10-04 NOTE — Progress Notes (Signed)
SHAWNISE, [name] ([id number]) Visit Report for 10/03/2016 Chief Complaint Document Details Patient Name: [name], [name]. Date of Service: 10/03/2016 1:30 PM Medical Record Number: [id number] Patient Account Number: [id number] Date of Birth/Sex: May 30, 1935 (82 y.o. Female) Treating RN: [name] Primary Care Provider: [name] Other Clinician: Referring Provider: [name] Treating Provider/Extender: Frann Rider in Treatment: 57 Information Obtained from: Patient Chief Complaint Patient here for reevaluation of her right lower externally ulcer Electronic Signature(s) Signed: 10/03/2016 2:19:13 PM By: Christin Fudge MD, FACS Entered By: Christin Fudge on 10/03/2016 14:19:13 Bartolo, [name] ([id number]) -------------------------------------------------------------------------------- HPI Details Patient Name: Carrie Mulligan C. Date of Service: 10/03/2016 1:30 PM Medical Record Number: [id number] Patient Account Number: [id number] Date of Birth/Sex: 11-01-1934 (82 y.o. Female) Treating RN: [name] Primary Care Provider: [name] Other Clinician: Referring Provider: [name] Treating Provider/Extender: Frann Rider in Treatment: 32 History of Present Illness HPI Description: 11/22/15; this is Gaba is a 82 year old woman who lives at home on her own. According to the patient and her daughter was present she has had long-standing edema in her legs dating back many years. She also has a history of chronic systolic heart failure, atrial fibrillation and is status post mitral valve replacement. Last echocardiogram I see in [facility] showed an ejection fraction of 40-45% she is on Lasix 60 mg a day and spironolactone 25 mg a day. Her current problem began in December around Christmas time she developed a small hematoma in the medial part of her left leg which rapidly expanded to a very large hematoma that required surgical debridement.  This situation was complicated by the fact that the patient is on long-standing Coumadin for mechanical heart valve. She went to the OR had this evacuated on January 4 /17. The wound has gradually improved however she has developed a small wounds around this area and more recently a wound on the right lateral leg. She is weeping edema fluid. The patient is already been to see vascular surgery. It was recommended that she wear Unna boots, she did not tolerate this due to pain in the left leg. She was then prescribed Juzo stockings and really can't get these on herself although truthfully there is probably too much edema for a Juzo stockings currently. She is not a diabetic and has no history of PAD or claudication that I could elicit. She does not use the external compression pumps reliably. She comes today with notes from her primary physician and Dr. Ronalee Belts both recommending various forms of compression but the patient does not really complied with them. Has been using the external compression pumps with some regularity but certainly not daily on the right leg and this has helped. I also note that her daughter tells me the history that she does not sleep in bed at home. She has a hospital bed but with her legs up she finds this painful so she sleeps in the couch sitting up with her legs dependent. 11/29/15; the patient is arrives today accompanied by her son. He expresses satisfaction that she is maintained the compression all week. 12/06/15; the patient has kept her Profore light wraps on, we have good edema control no major change in the wounds we have been using Aquacel 12/13/15; changed to RTD last week. One of the 3 wounds on her left medial leg is healed she has 2 remaining wounds here and one on the right lateral leg. 12/18/2015 -- the patient was at Dr. Nino Parsley office today and he was  seeing her for an arterial study. While the wrap was being removed she had an inadvertent laceration of  the left proximal anterior leg which bled quite profusely and a compression dressing was applied over this and the patient was here to get her Profore wraps done. I was asked to see the patient to make an assessment and treat appropriately. 12/26/15; the patient's injury on the left proximal leg and Steri-Strips removed after soaking. There is an open area here. The original wounds to still open on the right lateral and left medial leg. 01/03/16 patient's injury on her proximal left leg looks quite good. Still small open area on the medial left leg which appears to be improving. The area on the right lateral leg still substantially open with no real improvement in wound depth. Her edema control is marginal with a Profore light. We have been using RTD for 3-4 weeks without any major change 01/10/16: wounds without s/s of infection. vascular results are pending regarding arterial studies. 01/17/16; patient comes in today complaining of severe pain however I think most of this is in the right hip not related to her wounds. She continues with a oval-shaped wound on the right lateral leg, trauma to the left [name], [name] ([id number]) anterior leg just below her tibial plateau. She has a smaller eschar on the left anterior leg. She is being using Prisma however she informs [place] today that she is actually allergic to silver, nose this from a previous application at [facility] some years ago 01/24/16; edema is not so well controlled today, I think I reduced her to [place] bilaterally last week. The area which was a scissor injury on her left upper anterior lower leg is fully epithelialized. She only has a small open area remaining on the medial aspect of her left leg. The oval-shaped wound on the right mid lateral leg may be a bit smaller. Debrided of surface slough nonviable subcutaneous tissue. I had changed to collagen 2 weeks ago in an attempt to get this to close 01/31/16 all the patient's wounds on  her left leg give healed. We have good edema control with bilateral Profore lites which she has been compliant with. She still has the oval-shaped wound on the right lateral calf allergic even this appears to be slowly improving. The patient has juxtalite stockings at home. She states she thinks she can put these on. She also has external compression pumps at home although I think her compliance with this has not been good in the past. She complains today of edema in her thighs. Tells me she takes 40 mg of Lasix daily 02/21/16; only 1 small wound remains on the lateral aspect of the right calf. She is using Juzo stockings on the left leg although she complains about difficulty in applying them. She has external compression pumps at home 02/28/16; the small open wound on her right lateral calf is improved in terms of wound area. It appears that she has a wrap injury on the anterior aspect of the upper leg 03/06/16; the small open wound on her right lateral calf has a very small open area remaining. The wrap injury on the anterior aspect of the upper leg also appears better. She arrives today in clinic with a history of dyspnea with minimal exertion starting with the last 2-4 days. She does not describe chest pain. Her son and our intake nurse think she has facial swelling. She has a history of an artificial mitral valve on Coumadin 03/13/16;  the small wound on the right lateral calf is no better this week. The superior wrap injury anteriorly requires debridement of surface eschar and nonviable subcutaneous tissue. She has been to her primary doctor with regards to her dyspnea we identified last week. Per the patient's description her Lasix has been increased 03/21/2016 -- patient of Dr. Dellia Nims who could not keep her appointment yesterday but has come in today with the right lower leg looking good and this is the leg which has been treated in the recent past. However, her left lower extremity is  extremely swollen, tender and edematous with redness and discoloration. 03/27/16; there is only 2 small areas remain on the right leg. The edema that was so concerning last week has come down however there is extensive bruising on the lateral left leg medial left foot suggesting that she lost some blood into the leg itself. I checked her hemoglobin on 9/21 was 14.5. Her INR was over 6. Duplex ultrasound was negative for DVT 04/03/16 at this point in time today patient is actually doing substantially better in regard to the wound on the anterior right lower extremity. currently there is no slough or eschar noted and no evidence of erythema, discharge, or local infection. She is exhibiting no signs of systemic infection. She is tolerating the dressing changes as well as the wrapping at this point in time. 04/24/16; I have not seen Mrs. Kazee for 3 weeks. Apparently she was admitted to hospital for respiratory issues/also apparently has had multiple compression fractures and had kyphoplasty. When she was last here she only had one small open area remaining on the left leg she was using juxtalite stockings. Her son is upset about restarting the Coumadin which she blames or the swelling in her legs. She is on Coumadin for prophylaxis with a chronic artificial heart valve. 04/30/16 at this point in time patient has been tolerating the 3 layer compression wrap as well as the calcium alginate. 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 With tthat being said she does have the continued wound of the left lower extremity as well as the right medial lower extremity. The right side is significantly smaller compared to the left but actually is slough covered. fortunately she has no significant Munn, [name] ([id number]) tenderness at rest although with manipulation of the right location of the wound this is significantly tender. 05/07/16 for follow-up evaluation today both the patient's wounds appear  to be significantly improved in size compared to last week. She is also having less pain at both locations currently. Her pain level at most is related to be a 1-2 out of 10. She does have some discharge but fortunately no evidence of infection at this point.she also continues to tolerate the compression wraps well. 05/14/16; the patient's wound on the left leg actually looks as though it's on its way to closure. She is using collagen to this area. She has a small punched out area over the right medial calf. The cause of this is not really clear it has probably 0.4 cm of depth. She has an IV in her right hand which she states is for IV fluid when she develops low sodiums her potassiums, the etiology of this is not clear 05-21-16 she presents today with continued ulcerations the right lower extremity, posterior aspect. she has been using Iodoflex and compression therapy to the area. She denies any new injuries or trauma. She does have 2 areas proximal to this ulceration of dry crust along  with an area to her left posterior lower leg that is purple discolored area appears similar to how her current ulceration started, she denies any trauma or injury to the left posterior leg we will monitor all of these areas. 05/28/16; the patient is down to 1 small open area on the right medial mid calf. This is however larger and in 50% of the surface area deeper approximately 0.4 cm. The reason for this deterioration is unclear. I've gone ahead and done a culture of this area she also tells [place] today that she is short of breath. She is also noted a swelling on her lower left eyelid 06/05/16; the patient is 1 small open area on the right medial mid calf. This looks about the same as last week. The deep area, medial 50% of the wound looks about the same. CandS I did last week was negative. The entire area looks about the same 06/12/16; not much change in this over the course of the last week. Patient comes in today  complaining of extreme back pain. Apparently she has either a kyphoplasty or vertebral plasty scheduled at [facility] radiology tomorrow. For this reason no debridement today. We have been using Iodoflex 06/19/16; patient arrived with the surface eschar from last week removed with a curet debrided of subcutaneous tissue. She tolerates this reasonably well. She is still in a skilled facility. States her vertebral plasty last week is made her pain bearable 06/26/16; patient arrived with a new wound on the posterior aspect of her left calf. She notices 3 or 4 days ago but is not certain how this happened. She states she simply felt a stinging sensation and one day on her foot and then the next day on the back of her calf. 06/28/2016 -- [name] called urgently saying that the pain in her left foot was drastically worse over the last several hours and though she removed her compression wrap she wanted to be seen before the long holiday. 07/03/16; she came in to see Dr. Con Memos on 12/29 for pain in her left foot/heel. Nothing much was found at the time and she states that the pain is better but involved her whole foot. She has a wound on the lateral aspect of the left calf and the medial aspect of the right. The medial 1 looks as if it is just about healed however the left one still has considerable edema. 07/09/16; she arrives in clinic today with a dime-sized wound on the left posterior calf and a small linear area on the right medial calf. The area on the left was debrided with a #3 curet nonviable subcutaneous tissue, we are not making progress here. The area on the right was also debrided of surface eschar and nonviable subcutaneous tissue this has some depth to it. 07/16/16; she has a dime size wound on the left posterior calf that has some depth. This required debridement last week, although I did not feel the need to do this today. We used Iodoflex last week. Small area on the right also was not  debrided. This looks like it may be close to closing 07/23/16; using Iodoflex to both wound areas. The area on the right posterior/medial calf is just about closed. She still has a fairly substantial wound on the left posterior leg which is still problematic. 07/30/16; the area on the posterior right leg is closed. The patient will graduate to her own pressure stocking. She has a punched-out area on the left mid calf. The  surface is better than last week. I did not attempt debridement. Continuing Iodoflex Highbaugh, [name] ([id number]) Is complaining about pain in the right lateral ribs this is not pleuritic. She apparently had a chest x-ray done at home the immobile X x-ray. I don't have access to that report. She is not complaining of inspiratory chest pain cough etc. 08/07/16; the posterior right leg wound is closed and she is wearing a compression stocking her son expresses satisfaction with her compliance with the stocking. He continues to have a fairly substantial punched-out circular area on the left posterior calf. This wound only came about in late December, she is not really sure how it happened. 08/13/16; no major change; small punched out wound on the left posterior calf. I have reviewed vascular hx. Followed by Dr. Ronalee Belts, not felt to have PAD 08/21/16; the area on her left posterior calf still a small punched out wound. Debrided this with a #3 curet of surface necrotic material however the wound continues to clean up quite nicely and I think the dimensions overall her smaller. We have been using Promogran. UNFORTUNATELY the patient arrives today with a fairly substantial skin tear on the right anterior lateral leg that was sustained when an aide was helping her put on her compression stockings. This happened 3 days ago. We will continue Promogran to all wound areas now including the right leg again. She is asked to keep the wraps that we put on in place and we will discharge home  health today at her request 08/28/16; the area on her left posterior calf is small punched out but appears to have some improvement still required debridement. The large skin tear on the right lateral leg looks healthy and is smaller no debridement is required. There is no evidence of surrounding cellulitis. Using Promogran on both areas 09/04/16; the area on the left posterior calf continues to gradually improve however the large more recent skin tear on her right lateral leg arrived with a large necrotic surface. We have been using Promogran and that was used to avoid adherence to the right leg wound. 3/1 14/18; the area on the left posterior calf continues to contract. Skin tear on her right lateral leg also looks a better this week. We've been using Promogran [allergic to Silver] 09/18/16; slight change in the left posterior calf. Skin tear on her right leg is down about 50%. We've been using Promogran 09/25/16; improvement in the right posterior laceration wound. This looks like it is progressing towards closure. We are using Promogran here. No change in the small punched out wound on the posterior left calf. Also using Promogran under bilateral compression 10/03/2016 -- the patient normally sees Dr. Dellia Nims and he is here today because of logistic reasons. After reviewing her chart I note that her arterial studies were acceptable as per Dr. Nino Parsley office and he had recommended a 4-layer compression which the patient has not been tolerating very well. She has an allergy to silver and hence we are using plain collagen. She has lymphedema pumps which she is not using as advised and is being rather noncompliant. her son accompanies her today and I have had a detailed discussion about the proper way she should be using her lymphedema pumps and compliance with a 3 or 4 layer compression wrap. Electronic Signature(s) Signed: 10/03/2016 2:22:44 PM By: Christin Fudge MD, FACS Entered By: Christin Fudge  on 10/03/2016 14:22:44 Sottile, [name] ([id number]) -------------------------------------------------------------------------------- Physical Exam Details Patient Name: Carrie Mulligan C. Date  of Service: 10/03/2016 1:30 PM Medical Record Number: [id number] Patient Account Number: [id number] Date of Birth/Sex: 1934-07-05 (82 y.o. Female) Treating RN: [name] Primary Care Provider: [name] Other Clinician: Referring Provider: [name] Treating Provider/Extender: Frann Rider in Treatment: 45 Constitutional . Pulse regular. Respirations normal and unlabored. Afebrile. . Eyes Nonicteric. Reactive to light. Ears, Nose, Mouth, and Throat Lips, teeth, and gums WNL.Marland Kitchen Moist mucosa without lesions. Neck supple and nontender. No palpable supraclavicular or cervical adenopathy. Normal sized without goiter. Respiratory WNL. No retractions.. Cardiovascular Pedal Pulses WNL. No clubbing, cyanosis or edema. Lymphatic No adneopathy. No adenopathy. No adenopathy. Musculoskeletal Adexa without tenderness or enlargement.. Digits and nails w/o clubbing, cyanosis, infection, petechiae, ischemia, or inflammatory conditions.. Integumentary (Hair, Skin) No suspicious lesions. No crepitus or fluctuance. No peri-wound warmth or erythema. No masses.Marland Kitchen Psychiatric Judgement and insight Intact.. No evidence of depression, anxiety, or agitation.. Notes the wound of the left posterior leg is clean and no debridement was required today. The one on the right anterior lateral leg is rather superficial. Both lower extremities have significant lymphedema Electronic Signature(s) Signed: 10/03/2016 2:23:24 PM By: Christin Fudge MD, FACS Entered By: Christin Fudge on 10/03/2016 14:23:23 Soller, [name] ([id number]) -------------------------------------------------------------------------------- Physician Orders Details Patient Name: [name], Joleen C. Date of Service: 10/03/2016 1:30  PM Medical Record Number: [id number] Patient Account Number: [id number] Date of Birth/Sex: 1935/04/19 (82 y.o. Female) Treating RN: Carolyne Fiscal, Debi Primary Care Provider: [name] Other Clinician: Referring Provider: [name] Treating Provider/Extender: Frann Rider in Treatment: 5 Verbal / Phone Orders: Yes Clinician: Carolyne Fiscal, Debi Read Back and Verified: Yes Diagnosis Coding Wound Cleansing Wound #10 Right,Lateral Lower Leg o Clean wound with Normal Saline. o Cleanse wound with mild soap and water o May Shower, gently pat wound dry prior to applying new dressing. Wound #9 Left,Posterior Lower Leg o Clean wound with Normal Saline. o Cleanse wound with mild soap and water o May Shower, gently pat wound dry prior to applying new dressing. Anesthetic Wound #10 Right,Lateral Lower Leg o Topical Lidocaine 4% cream applied to wound bed prior to debridement - for clinic use Wound #9 Left,Posterior Lower Leg o Topical Lidocaine 4% cream applied to wound bed prior to debridement - for clinic use Primary Wound Dressing Wound #10 Right,Lateral Lower Leg o Promogran - plain collagen ****NO SILVER**** o Other: - mepitel Wound #9 Left,Posterior Lower Leg o Promogran - plain collagen ****NO SILVER**** o Other: - mepitel Secondary Dressing Wound #10 Right,Lateral Lower Leg o ABD pad o Dry Gauze Wound #9 Left,Posterior Lower Leg o ABD pad Hannay, [name] ([id number]) o Dry Gauze Dressing Change Frequency Wound #10 Right,Lateral Lower Leg o Change dressing every week - only at wound care center Wound #9 Left,Posterior Lower Leg o Change dressing every week - only at wound care center Follow-up Appointments Wound #10 Right,Lateral Lower Leg o Return Appointment in 1 week. Wound #9 Left,Posterior Lower Leg o Return Appointment in 1 week. Edema Control Wound #10 Right,Lateral Lower Leg o 3 Layer Compression System -  Bilateral o Elevate legs to the level of the heart and pump ankles as often as possible Wound #9 Left,Posterior Lower Leg o 3 Layer Compression System - Bilateral o Elevate legs to the level of the heart and pump ankles as often as possible Additional Orders / Instructions Wound #10 Right,Lateral Lower Leg o Increase protein intake. Wound #9 Left,Posterior Lower Leg o Increase protein intake. Electronic Signature(s) Signed: 10/03/2016 4:30:26 PM By: Christin Fudge MD, FACS Signed: 10/03/2016  5:04:07 PM By: [name] Entered By: [name] on 10/03/2016 14:09:14 [place], [name] ([id number]) -------------------------------------------------------------------------------- Problem List Details Patient Name: Carrie Mulligan C. Date of Service: 10/03/2016 1:30 PM Medical Record Number: [id number] Patient Account Number: [id number] Date of Birth/Sex: [birth year] (82 y.o. Female) Treating RN: Carolyne Fiscal, Debi Primary Care Provider: [name] Other Clinician: Referring Provider: [name] Treating Provider/Extender: Frann Rider in Treatment: 2 Active Problems ICD-10 Encounter Code Description Active Date Diagnosis L97.812 Non-pressure chronic ulcer of other part of right lower leg 04/30/2016 Yes with fat layer exposed I87.333 Chronic venous hypertension (idiopathic) with ulcer and 04/03/2016 Yes inflammation of bilateral lower extremity I89.0 Lymphedema, not elsewhere classified 04/03/2016 Yes N98.92 Chronic systolic (congestive) heart failure 04/03/2016 Yes L97.221 Non-pressure chronic ulcer of left calf limited to 07/03/2016 Yes breakdown of skin Inactive Problems Resolved Problems ICD-10 Code Description Active Date Resolved Date S81.812S Laceration without foreign body, left lower leg, sequela 04/03/2016 04/03/2016 Electronic Signature(s) Signed: 10/03/2016 2:19:00 PM By: Christin Fudge MD, FACS Entered By: Christin Fudge on 10/03/2016  14:18:59 [place]en, [place]. ([id number]) -------------------------------------------------------------------------------- Progress Note Details Patient Name: [name], [name] Date of Service: 10/03/2016 1:30 PM Medical Record Number: [id number] Patient Account Number: [id number] Date of Birth/Sex: 02-25-1935 (82 y.o. Female) Treating RN: Carolyne Fiscal, Debi Primary Care Provider: [name] Other Clinician: Referring Provider: [name] Treating Provider/Extender: Frann Rider in Treatment: 23 Subjective Chief Complaint Information obtained from Patient Patient here for reevaluation of her right lower externally ulcer History of Present Illness (HPI) 11/22/15; this is [place] is a 82 year old woman who lives at home on her own. According to the patient and her daughter was present she has had long-standing edema in her legs dating back many years. She also has a history of chronic systolic heart failure, atrial fibrillation and is status post mitral valve replacement. Last echocardiogram I [facility] in [facility] showed an ejection fraction of 40-45% she is on Lasix 60 mg a day and spironolactone 25 mg a day. Her current problem began in December around Christmas time she developed a small hematoma in the medial part of her left leg which rapidly expanded to a very large hematoma that required surgical debridement. This situation was complicated by the fact that the patient is on long-standing Coumadin for mechanical heart valve. She went to the OR had this evacuated on January 4 /17. The wound has gradually improved however she has developed a small wounds around this area and more recently a wound on the right lateral leg. She is weeping edema fluid. The patient is already been to see vascular surgery. It was recommended that she wear Unna boots, she did not tolerate this due to pain in the left leg. She was then prescribed Juzo stockings and really can't get these on herself  although truthfully there is probably too much edema for a Juzo stockings currently. She is not a diabetic and has no history of PAD or claudication that I could elicit. She does not use the external compression pumps reliably. She comes today with notes from her primary physician and Dr. Ronalee Belts both recommending various forms of compression but the patient does not really complied with them. Has been using the external compression pumps with some regularity but certainly not daily on the right leg and this has helped. I also note that her daughter tells me the history that she does not sleep in bed at home. She has a hospital bed but with her legs up she finds this painful so  she sleeps in the couch sitting up with her legs dependent. 11/29/15; the patient is arrives today accompanied by her son. He expresses satisfaction that she is maintained the compression all week. 12/06/15; the patient has kept her Profore light wraps on, we have good edema control no major change in the wounds we have been using Aquacel 12/13/15; changed to RTD last week. One of the 3 wounds on her left medial leg is healed she has 2 remaining wounds here and one on the right lateral leg. 12/18/2015 -- the patient was at Dr. Nino Parsley office today and he was seeing her for an arterial study. While the wrap was being removed she had an inadvertent laceration of the left proximal anterior leg which bled quite profusely and a compression dressing was applied over this and the patient was here to get her Profore wraps done. I was asked to see the patient to make an assessment and treat appropriately. 12/26/15; the patient's injury on the left proximal leg and Steri-Strips removed after soaking. There is an open area here. The original wounds to still open on the right lateral and left medial leg. 01/03/16 patient's injury on her proximal left leg looks quite good. Still small open area on the medial left leg which appears to be  improving. The area on the right lateral leg still substantially open with no real Padmore, Laurieann C. ([id number]) improvement in wound depth. Her edema control is marginal with a Profore light. We have been using RTD for 3-4 weeks without any major change 01/10/16: wounds without s/s of infection. vascular results are pending regarding arterial studies. 01/17/16; patient comes in today complaining of severe pain however I think most of this is in the right hip not related to her wounds. She continues with a oval-shaped wound on the right lateral leg, trauma to the left anterior leg just below her tibial plateau. She has a smaller eschar on the left anterior leg. 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 The area which was a scissor injury on her left upper anterior lower leg is fully epithelialized. She only has a small open area remaining on the medial aspect of her left leg. The oval-shaped wound on the right mid lateral leg may be a bit smaller. Debrided of surface slough nonviable subcutaneous tissue. I had changed to collagen 2 weeks ago in an attempt to get this to close 01/31/16 all the patient's wounds on her left leg give healed. We have good edema control with bilateral Profore lites which she has been compliant with. She still has the oval-shaped wound on the right lateral calf allergic even this appears to be slowly improving. The patient has juxtalite stockings at home. She states she thinks she can put these on. She also has external compression pumps at home although I think her compliance with this has not been good in the past. She complains today of edema in her thighs. Tells me she takes 40 mg of Lasix daily 02/21/16; only 1 small wound remains on the lateral  aspect of the right calf. She is using Juzo stockings on the left leg although she complains about difficulty in applying them. She has external compression pumps at home 02/28/16; the small open wound on her right lateral calf  is improved in terms of wound area. It appears that she has a wrap injury on the anterior aspect of the upper leg 03/06/16; the small open wound on her right lateral calf has a very small open area remaining. The wrap injury on the anterior aspect of the upper leg also appears better. She arrives today in clinic with a history of dyspnea with minimal exertion starting with the last 2-4 days. She does not describe chest pain. Her son and our intake nurse think she has facial swelling. She has a history of an artificial mitral valve on Coumadin 03/13/16; the small wound on the right lateral calf is no better this week. The superior wrap injury anteriorly requires debridement of surface eschar and nonviable subcutaneous tissue. She has been to her primary doctor with regards to her dyspnea we identified last week. Per the patient's description her Lasix has been increased 03/21/2016 -- patient of Dr. Dellia Nims who could not keep her appointment yesterday but has come in today with the right lower leg looking good and this is the leg which has been treated in the recent past. However, her left lower extremity is extremely swollen, tender and edematous with redness and discoloration. 03/27/16; there is only 2 small areas remain on the right leg. The edema that was so concerning last week has come down however there is extensive bruising on the lateral left leg medial left foot suggesting that she lost some blood into the leg itself. I checked her hemoglobin on 9/21 was 14.5. Her INR was over 6. Duplex ultrasound was negative for DVT 04/03/16 at this point in time today patient is actually doing substantially better in regard to the wound on the anterior right lower extremity.  currently there is no slough or eschar noted and no evidence of erythema, discharge, or local infection. She is exhibiting no signs of systemic infection. She is tolerating the dressing changes as well as the wrapping at this point in time. 04/24/16; I have not seen Mrs. Stambaugh for 3 weeks. Apparently she was admitted to hospital for respiratory issues/also apparently has had multiple compression fractures and had kyphoplasty. When she was last here she only had one small open area remaining on the left leg she was using juxtalite stockings. Her son is upset about restarting the Coumadin which she blames or the swelling in her legs. She is on Coumadin for prophylaxis with a chronic artificial heart valve. [facility] ([id number]) 04/30/16 at this point in time patient has been tolerating the 3 layer compression wrap as well as the calcium alginate. 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 With tthat being said she does have the continued wound of the left lower extremity as well as the right medial lower extremity. The right side is significantly smaller compared to the left but actually is slough covered. fortunately she has no significant tenderness at rest although with manipulation of the right location of the wound this is significantly tender. 05/07/16 for follow-up evaluation today both the patient's wounds appear to be significantly improved in size compared to last week. She is also having less pain at both locations currently. Her pain level at most is related to be a 1-2 out of 10. She does have some discharge but fortunately no evidence of infection at this point.she also continues to tolerate the compression wraps well. 05/14/16; the patient's wound on the left leg actually looks as though it's on its way to closure. She is using collagen to this  area. She has a small punched out area over the right medial calf. The cause of this is not really clear it has probably 0.4  cm of depth. She has an IV in her right hand which she states is for IV fluid when she develops low sodiums her potassiums, the etiology of this is not clear 05-21-16 she presents today with continued ulcerations the right lower extremity, posterior aspect. she has been using Iodoflex and compression therapy to the area. She denies any new injuries or trauma. She does have 2 areas proximal to this ulceration of dry crust along with an area to her left posterior lower leg that is purple discolored area appears similar to how her current ulceration started, she denies any trauma or injury to the left posterior leg we will monitor all of these areas. 05/28/16; the patient is down to 1 small open area on the right medial mid calf. This is however larger and in 50% of the surface area deeper approximately 0.4 cm. The reason for this deterioration is unclear. I've gone ahead and done a culture of this area she also tells [place] today that she is short of breath. She is also noted a swelling on her lower left eyelid 06/05/16; the patient is 1 small open area on the right medial mid calf. This looks about the same as last week. The deep area, medial 50% of the wound looks about the same. CandS I did last week was negative. The entire area looks about the same 06/12/16; not much change in this over the course of the last week. Patient comes in today complaining of extreme back pain. Apparently she has either a kyphoplasty or vertebral plasty scheduled at [facility] radiology tomorrow. For this reason no debridement today. We have been using Iodoflex 06/19/16; patient arrived with the surface eschar from last week removed with a curet debrided of subcutaneous tissue. She tolerates this reasonably well. She is still in a skilled facility. States her vertebral plasty last week is made her pain bearable 06/26/16; patient arrived with a new wound on the posterior aspect of her left calf. She notices 3 or 4  days ago but is not certain how this happened. She states she simply felt a stinging sensation and one day on her foot and then the next day on the back of her calf. 06/28/2016 -- [name] called urgently saying that the pain in her left foot was drastically worse over the last several hours and though she removed her compression wrap she wanted to be seen before the long holiday. 07/03/16; she came in to see Dr. Con Memos on 12/29 for pain in her left foot/heel. Nothing much was found at the time and she states that the pain is better but involved her whole foot. She has a wound on the lateral aspect of the left calf and the medial aspect of the right. The medial 1 looks as if it is just about healed however the left one still has considerable edema. 07/09/16; she arrives in clinic today with a dime-sized wound on the left posterior calf and a small linear area on the right medial calf. The area on the left was debrided with a #3 curet nonviable subcutaneous tissue, we are not making progress here. The area on the right was also debrided of surface eschar and nonviable subcutaneous tissue this has some depth to it. 07/16/16; she has a dime size wound on the left posterior calf that has some depth. This required  debridement last week, although I did not feel the need to do this today. We used Iodoflex last week. Small area on the right also was not debrided. This looks like it may be close to closing 07/23/16; using Iodoflex to both wound areas. The area on the right posterior/medial calf is just about closed. Kuc, [name] ([id number]) She still has a fairly substantial wound on the left posterior leg which is still problematic. 07/30/16; the area on the posterior right leg is closed. The patient will graduate to her own pressure stocking. She has a punched-out area on the left mid calf. The surface is better than last week. I did not attempt debridement. Continuing Iodoflex Is complaining  about pain in the right lateral ribs this is not pleuritic. She apparently had a chest x-ray done at home the immobile X x-ray. I don't have access to that report. She is not complaining of inspiratory chest pain cough etc. 08/07/16; the posterior right leg wound is closed and she is wearing a compression stocking her son expresses satisfaction with her compliance with the stocking. He continues to have a fairly substantial punched-out circular area on the left posterior calf. This wound only came about in late December, she is not really sure how it happened. 08/13/16; no major change; small punched out wound on the left posterior calf. I have reviewed vascular hx. Followed by Dr. Ronalee Belts, not felt to have PAD 08/21/16; the area on her left posterior calf still a small punched out wound. Debrided this with a #3 curet of surface necrotic material however the wound continues to clean up quite nicely and I think the dimensions overall her smaller. We have been using Promogran. UNFORTUNATELY the patient arrives today with a fairly substantial skin tear on the right anterior lateral leg that was sustained when an aide was helping her put on her compression stockings. This happened 3 days ago. We will continue Promogran to all wound areas now including the right leg again. She is asked to keep the wraps that we put on in place and we will discharge home health today at her request 08/28/16; the area on her left posterior calf is small punched out but appears to have some improvement still required debridement. The large skin tear on the right lateral leg looks healthy and is smaller no debridement is required. There is no evidence of surrounding cellulitis. Using Promogran on both areas 09/04/16; the area on the left posterior calf continues to gradually improve however the large more recent skin tear on her right lateral leg arrived with a large necrotic surface. We have been using Promogran and that was  used to avoid adherence to the right leg wound. 3/1 14/18; the area on the left posterior calf continues to contract. Skin tear on her right lateral leg also looks a better this week. We've been using Promogran [allergic to Silver] 09/18/16; slight change in the left posterior calf. Skin tear on her right leg is down about 50%. We've been using Promogran 09/25/16; improvement in the right posterior laceration wound. This looks like it is progressing towards closure. We are using Promogran here. No change in the small punched out wound on the posterior left calf. Also using Promogran under bilateral compression 10/03/2016 -- the patient normally sees Dr. Dellia Nims and he is here today because of logistic reasons. After reviewing her chart I note that her arterial studies were acceptable as per Dr. Nino Parsley office and he had recommended a 4-layer compression which the  patient has not been tolerating very well. She has an allergy to silver and hence we are using plain collagen. She has lymphedema pumps which she is not using as advised and is being rather noncompliant. her son accompanies her today and I have had a detailed discussion about the proper way she should be using her lymphedema pumps and compliance with a 3 or 4 layer compression wrap. Objective Gorney, [name] ([id number]) Constitutional Pulse regular. Respirations normal and unlabored. Afebrile. Vitals Time Taken: 1:39 PM, Height: 68 in, Weight: 178 lbs, BMI: 27.1, Temperature: 97.6 F, Pulse: 68 bpm, Respiratory Rate: 16 breaths/min, Blood Pressure: 122/58 mmHg. Eyes Nonicteric. Reactive to light. Ears, Nose, Mouth, and Throat Lips, teeth, and gums WNL.Marland Kitchen Moist mucosa without lesions. Neck supple and nontender. No palpable supraclavicular or cervical adenopathy. Normal sized without goiter. Respiratory WNL. No retractions.. Cardiovascular Pedal Pulses WNL. No clubbing, cyanosis or edema. Lymphatic No adneopathy. No  adenopathy. No adenopathy. Musculoskeletal Adexa without tenderness or enlargement.. Digits and nails w/o clubbing, cyanosis, infection, petechiae, ischemia, or inflammatory conditions.Marland Kitchen Psychiatric Judgement and insight Intact.. No evidence of depression, anxiety, or agitation.. General Notes: the wound of the left posterior leg is clean and no debridement was required today. The one on the right anterior lateral leg is rather superficial. Both lower extremities have significant lymphedema Integumentary (Hair, Skin) No suspicious lesions. No crepitus or fluctuance. No peri-wound warmth or erythema. No masses.. Wound #10 status is Open. Original cause of wound was Shear/Friction. The wound is located on the Right,Lateral Lower Leg. The wound measures 0.5cm length x 0.2cm width x 0.1cm depth; 0.079cm^2 area and 0.008cm^3 volume. There is no tunneling or undermining noted. There is a large amount of serosanguineous drainage noted. The wound margin is distinct with the outline attached to the wound base. There is large (67-100%) red granulation within the wound bed. There is a small (1-33%) amount of necrotic tissue within the wound bed including Adherent Slough. Periwound temperature was noted as No Abnormality. The periwound has tenderness on palpation. Wound #9 status is Open. Original cause of wound was Gradually Appeared. The wound is located on the Left,Posterior Lower Leg. The wound measures 1cm length x 0.6cm width x 0.1cm depth; 0.471cm^2 area and 0.047cm^3 volume. The wound is limited to skin breakdown. There is no tunneling or undermining Smyers, [name] ([id number]) noted. There is a large amount of serosanguineous drainage noted. The wound margin is flat and intact. There is large (67-100%) red granulation within the wound bed. There is a small (1-33%) amount of necrotic tissue within the wound bed including Adherent Slough. The periwound skin appearance exhibited: Ecchymosis.  Periwound temperature was noted as No Abnormality. The periwound has tenderness on palpation. Assessment Active Problems ICD-10 L97.812 - Non-pressure chronic ulcer of other part of right lower leg with fat layer exposed I87.333 - Chronic venous hypertension (idiopathic) with ulcer and inflammation of bilateral lower extremity I89.0 - Lymphedema, not elsewhere classified D32.67 - Chronic systolic (congestive) heart failure L97.221 - Non-pressure chronic ulcer of left calf limited to breakdown of skin Plan Wound Cleansing: Wound #10 Right,Lateral Lower Leg: Clean wound with Normal Saline. Cleanse wound with mild soap and water May Shower, gently pat wound dry prior to applying new dressing. Wound #9 Left,Posterior Lower Leg: Clean wound with Normal Saline. Cleanse wound with mild soap and water May Shower, gently pat wound dry prior to applying new dressing. Anesthetic: Wound #10 Right,Lateral Lower Leg: Topical Lidocaine 4% cream applied to wound bed prior to  debridement - for clinic use Wound #9 Left,Posterior Lower Leg: Topical Lidocaine 4% cream applied to wound bed prior to debridement - for clinic use Primary Wound Dressing: Wound #10 Right,Lateral Lower Leg: Promogran - plain collagen ****NO SILVER**** Other: - mepitel Wound #9 Left,Posterior Lower Leg: Promogran - plain collagen ****NO SILVER**** Other: - mepitel Secondary Dressing: Wound #10 Right,Lateral Lower Leg: Arntz, [name] ([id number]) ABD pad Dry Gauze Wound #9 Left,Posterior Lower Leg: ABD pad Dry Gauze Dressing Change Frequency: Wound #10 Right,Lateral Lower Leg: Change dressing every week - only at wound care center Wound #9 Left,Posterior Lower Leg: Change dressing every week - only at wound care center Follow-up Appointments: Wound #10 Right,Lateral Lower Leg: Return Appointment in 1 week. Wound #9 Left,Posterior Lower Leg: Return Appointment in 1 week. Edema Control: Wound #10  Right,Lateral Lower Leg: 3 Layer Compression System - Bilateral Elevate legs to the level of the heart and pump ankles as often as possible Wound #9 Left,Posterior Lower Leg: 3 Layer Compression System - Bilateral Elevate legs to the level of the heart and pump ankles as often as possible Additional Orders / Instructions: Wound #10 Right,Lateral Lower Leg: Increase protein intake. Wound #9 Left,Posterior Lower Leg: Increase protein intake. I have spent some time discussing with the patient and her son, who is at the bedside, that the patient needs to be a bit more compliant with using her lymphedema pumps on a regular basis and also letting us apply adequate compression to her lower extremities. my recommendations are: 1. Collagen with a 3 layer compression wrap to be applied --Profore light. 2. discussed elevation and exercise 3. Use of her lymphedema pumps an hour each time twice a day 4. See Dr. Dellia Nims next week Electronic Signature(s) Signed: 10/03/2016 2:25:21 PM By: Christin Fudge MD, FACS Entered By: Christin Fudge on 10/03/2016 14:25:21 Oliveira, [name] ([id number]) -------------------------------------------------------------------------------- SuperBill Details Patient Name: Carrie Mulligan C. Date of Service: 10/03/2016 Medical Record Number: [id number] Patient Account Number: [id number] Date of Birth/Sex: [birth year] (82 y.o. Female) Treating RN: Carolyne Fiscal, Debi Primary Care Provider: [name] Other Clinician: Referring Provider: [name] Treating Provider/Extender: Frann Rider in Treatment: 81 Diagnosis Coding ICD-10 Codes Code Description [phone] Non-pressure chronic ulcer of other part of right lower leg with fat layer exposed Chronic venous hypertension (idiopathic) with ulcer and inflammation of bilateral lower I87.333 extremity I89.0 Lymphedema, not elsewhere classified T26.71 Chronic systolic (congestive) heart failure L97.221 Non-pressure  chronic ulcer of left calf limited to breakdown of skin Facility Procedures CPT4: Description Modifier Quantity Code [id number] BILATERAL: Application of multi-layer venous compression 1 system; leg (below knee), including ankle and foot. Physician Procedures CPT4: Description Modifier Quantity Code [id number] - WC PHYS LEVEL 3 - EST PT 1 ICD-10 Description Diagnosis L97.812 Non-pressure chronic ulcer of other part of right lower leg with fat layer exposed I87.333 Chronic venous hypertension (idiopathic)  with ulcer and inflammation of bilateral lower extremity X90.24 Chronic systolic (congestive) heart failure L97.221 Non-pressure chronic ulcer of left calf limited to breakdown of skin Electronic Signature(s) Signed: 10/03/2016 4:30:26 PM By: Christin Fudge MD, FACS Signed: 10/03/2016 5:04:07 PM By: [name] Previous Signature: 10/03/2016 2:25:42 PM Version By: Christin Fudge MD, FACS Entered By: [name] on 10/03/2016 15:19:54

## 2016-10-09 ENCOUNTER — Encounter: Payer: Medicare Other | Admitting: Internal Medicine

## 2016-10-09 DIAGNOSIS — I87333 Chronic venous hypertension (idiopathic) with ulcer and inflammation of bilateral lower extremity: Secondary | ICD-10-CM | POA: Diagnosis not present

## 2016-10-11 NOTE — Progress Notes (Signed)
[name], [name] ([id number]) Visit Report for 10/09/2016 Chief Complaint Document Details Patient Name: [name], [name]. Date of Service: 10/09/2016 3:30 PM Medical Record Patient Account Number: [id number] Number: Treating RN: [name] 1935-05-06 (81 y.o. Other Clinician: Date of Birth/Sex: Female) Treating [name], [name] Primary Care Provider: [name] Provider/Extender: G Referring Provider: [name] in Treatment: 46 Information Obtained from: Patient Chief Complaint Patient here for reevaluation of her right lower externally ulcer Electronic Signature(s) Signed: 10/09/2016 5:13:56 PM By: [name] MD Entered By: [name] on 10/09/2016 17:08:51 [name], [name] -------------------------------------------------------------------------------- HPI Details Patient Name: [name]. Date of Service: 10/09/2016 3:30 PM Medical Record Patient Account Number: [id number] Number: Treating RN: [name] Feb 12, 1935 (81 y.o. Other Clinician: Date of Birth/Sex: Female) Treating [name], [name] Primary Care Provider: [name] Provider/Extender: G Referring Provider: [name] in Treatment: 46 History of Present Illness HPI Description: 11/22/15; this is [place] is a 82 year old woman who lives at home on her own. According to the patient and her daughter was present she has had long-standing edema in her legs dating back many years. She also has a history of chronic systolic heart failure, atrial fibrillation and is status post mitral valve replacement. Last echocardiogram I see in [facility] showed an ejection fraction of 40-45% she is on Lasix 60 mg a day and spironolactone 25 mg a day. Her current problem began in December around Christmas time she developed a small hematoma in the medial part of her left leg which rapidly expanded to a very large hematoma that required  surgical debridement. This situation was complicated by the fact that the patient is on long-standing Coumadin for mechanical heart valve. She went to the OR had this evacuated on January 4 /17. The wound has gradually improved however she has developed a small wounds around this area and more recently a wound on the right lateral leg. She is weeping edema fluid. The patient is already been to see vascular surgery. It was recommended that she wear Unna boots, she did not tolerate this due to pain in the left leg. She was then prescribed Juzo stockings and really can't get these on herself although truthfully there is probably too much edema for a Juzo stockings currently. She is not a diabetic and has no history of PAD or claudication that I could elicit. She does not use the external compression pumps reliably. She comes today with notes from her primary physician and Dr. Ronalee Belts both recommending various forms of compression but the patient does not really complied with them. Has been using the external compression pumps with some regularity but certainly not daily on the right leg and this has helped. I also note that her daughter tells me the history that she does not sleep in bed at home. She has a hospital bed but with her legs up she finds this painful so she sleeps in the couch sitting up with her legs dependent. 11/29/15; the patient is arrives today accompanied by her son. He expresses satisfaction that she is maintained the compression all week. 12/06/15; the patient has kept her Profore light wraps on, we have good edema control no major change in the wounds we have been using Aquacel 12/13/15; changed to RTD last week. One of the 3 wounds on her left medial leg is healed she has 2 remaining wounds here and one on the right lateral leg. 12/18/2015 -- the patient was at Dr. Nino Parsley office today and he  was seeing her for an arterial study. While the wrap was being removed she had an  inadvertent laceration of the left proximal anterior leg which bled quite profusely and a compression dressing was applied over this and the patient was here to get her Profore wraps done. I was asked to see the patient to make an assessment and treat appropriately. 12/26/15; the patient's injury on the left proximal leg and Steri-Strips removed after soaking. There is an open area here. The original wounds to still open on the right lateral and left medial leg. 01/03/16 patient's injury on her proximal left leg looks quite good. Still small open area on the medial left leg which appears to be improving. The area on the right lateral leg still substantially open with no real improvement in wound depth. Her edema control is marginal with a Profore light. We have been using RTD for 3-4 weeks without any major change 01/10/16: wounds without s/s of infection. vascular results are pending regarding arterial studies. [name], [name] ([id number]) 01/17/16; patient comes in today complaining of severe pain however I think most of this is in the right hip not related to her wounds. She continues with a oval-shaped wound on the right lateral leg, trauma to the left anterior leg just below her tibial plateau. She has a smaller eschar on the left anterior leg. She is being using Prisma however she informs [place] today that she is actually allergic to silver, nose this from a previous application at [facility] some years ago 01/24/16; edema is not so well controlled today, I think I reduced her to Sedan bilaterally last week. The area which was a scissor injury on her left upper anterior lower leg is fully epithelialized. She only has a small open area remaining on the medial aspect of her left leg. The oval-shaped wound on the right mid lateral leg may be a bit smaller. Debrided of surface slough nonviable subcutaneous tissue. I had changed to collagen 2 weeks ago in an attempt to get this to close 01/31/16 all  the patient's wounds on her left leg give healed. We have good edema control with bilateral Profore lites which she has been compliant with. She still has the oval-shaped wound on the right lateral calf allergic even this appears to be slowly improving. The patient has juxtalite stockings at home. She states she thinks she can put these on. She also has external compression pumps at home although I think her compliance with this has not been good in the past. She complains today of edema in her thighs. Tells me she takes 40 mg of Lasix daily 02/21/16; only 1 small wound remains on the lateral aspect of the right calf. She is using Juzo stockings on the left leg although she complains about difficulty in applying them. She has external compression pumps at home 02/28/16; the small open wound on her right lateral calf is improved in terms of wound area. It appears that she has a wrap injury on the anterior aspect of the upper leg 03/06/16; the small open wound on her right lateral calf has a very small open area remaining. The wrap injury on the anterior aspect of the upper leg also appears better. She arrives today in clinic with a history of dyspnea with minimal exertion starting with the last 2-4 days. She does not describe chest pain. Her son and our intake nurse think she has facial swelling. She has a history of an artificial mitral valve on Coumadin  03/13/16; the small wound on the right lateral calf is no better this week. The superior wrap injury anteriorly requires debridement of surface eschar and nonviable subcutaneous tissue. She has been to her primary doctor with regards to her dyspnea we identified last week. Per the patient's description her Lasix has been increased 03/21/2016 -- patient of Dr. Dellia Nims who could not keep her appointment yesterday but has come in today with the right lower leg looking good and this is the leg which has been treated in the recent past. However, her left  lower extremity is extremely swollen, tender and edematous with redness and discoloration. 03/27/16; there is only 2 small areas remain on the right leg. The edema that was so concerning last week has come down however there is extensive bruising on the lateral left leg medial left foot suggesting that she lost some blood into the leg itself. I checked her hemoglobin on 9/21 was 14.5. Her INR was over 6. Duplex ultrasound was negative for DVT 04/03/16 at this point in time today patient is actually doing substantially better in regard to the wound on the anterior right lower extremity. currently there is no slough or eschar noted and no evidence of erythema, discharge, or local infection. She is exhibiting no signs of systemic infection. She is tolerating the dressing changes as well as the wrapping at this point in time. 04/24/16; I have not seen Mrs. Boehm for 3 weeks. Apparently she was admitted to hospital for respiratory issues/also apparently has had multiple compression fractures and had kyphoplasty. When she was last here she only had one small open area remaining on the left leg she was using juxtalite stockings. Her son is upset about restarting the Coumadin which she blames or the swelling in her legs. She is on Coumadin for prophylaxis with a chronic artificial heart valve. 04/30/16 at this point in time patient has been tolerating the 3 layer compression wrap as well as the calcium alginate. 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 With tthat being said she does have the Gin, [place]. ([id number]) continued wound of the left lower extremity as well as the right medial lower extremity. The right side is significantly smaller compared to the left but actually is slough covered. fortunately she has no significant tenderness at rest although with manipulation of the right location of the wound this is significantly tender. 05/07/16 for follow-up evaluation today both the  patient's wounds appear to be significantly improved in size compared to last week. She is also having less pain at both locations currently. Her pain level at most is related to be a 1-2 out of 10. She does have some discharge but fortunately no evidence of infection at this point.she also continues to tolerate the compression wraps well. 05/14/16; the patient's wound on the left leg actually looks as though it's on its way to closure. She is using collagen to this area. She has a small punched out area over the right medial calf. The cause of this is not really clear it has probably 0.4 cm of depth. She has an IV in her right hand which she states is for IV fluid when she develops low sodiums her potassiums, the etiology of this is not clear 05-21-16 she presents today with continued ulcerations the right lower extremity, posterior aspect. she has been using Iodoflex and compression therapy to the area. She denies any new injuries or trauma. She does have 2 areas proximal to this ulceration of dry crust  along with an area to her left posterior lower leg that is purple discolored area appears similar to how her current ulceration started, she denies any trauma or injury to the left posterior leg we will monitor all of these areas. 05/28/16; the patient is down to 1 small open area on the right medial mid calf. This is however larger and in 50% of the surface area deeper approximately 0.4 cm. The reason for this deterioration is unclear. I've gone ahead and done a culture of this area she also tells [place] today that she is short of breath. She is also noted a swelling on her lower left eyelid 06/05/16; the patient is 1 small open area on the right medial mid calf. This looks about the same as last week. The deep area, medial 50% of the wound looks about the same. CandS I did last week was negative. The entire area looks about the same 06/12/16; not much change in this over the course of the last  week. Patient comes in today complaining of extreme back pain. Apparently she has either a kyphoplasty or vertebral plasty scheduled at [facility] radiology tomorrow. For this reason no debridement today. We have been using Iodoflex 06/19/16; patient arrived with the surface eschar from last week removed with a curet debrided of subcutaneous tissue. She tolerates this reasonably well. She is still in a skilled facility. States her vertebral plasty last week is made her pain bearable 06/26/16; patient arrived with a new wound on the posterior aspect of her left calf. She notices 3 or 4 days ago but is not certain how this happened. She states she simply felt a stinging sensation and one day on her foot and then the next day on the back of her calf. 06/28/2016 -- Ms. Reubin Milan called urgently saying that the pain in her left foot was drastically worse over the last several hours and though she removed her compression wrap she wanted to be seen before the long holiday. 07/03/16; she came in to see Dr. Con Memos on 12/29 for pain in her left foot/heel. Nothing much was found at the time and she states that the pain is better but involved her whole foot. She has a wound on the lateral aspect of the left calf and the medial aspect of the right. The medial 1 looks as if it is just about healed however the left one still has considerable edema. 07/09/16; she arrives in clinic today with a dime-sized wound on the left posterior calf and a small linear area on the right medial calf. The area on the left was debrided with a #3 curet nonviable subcutaneous tissue, we are not making progress here. The area on the right was also debrided of surface eschar and nonviable subcutaneous tissue this has some depth to it. 07/16/16; she has a dime size wound on the left posterior calf that has some depth. This required debridement last week, although I did not feel the need to do this today. We used Iodoflex last week.  Small area on the right also was not debrided. This looks like it may be close to closing 07/23/16; using Iodoflex to both wound areas. The area on the right posterior/medial calf is just about closed. She still has a fairly substantial wound on the left posterior leg which is still problematic. 07/30/16; the area on the posterior right leg is closed. The patient will graduate to her own pressure stocking. She has a punched-out area on the left mid calf.  The surface is better than last week. I did not attempt Flight, Vara C. ([id number]) debridement. Continuing Iodoflex Is complaining about pain in the right lateral ribs this is not pleuritic. She apparently had a chest x-ray done at home the immobile X x-ray. I don't have access to that report. She is not complaining of inspiratory chest pain cough etc. 08/07/16; the posterior right leg wound is closed and she is wearing a compression stocking her son expresses satisfaction with her compliance with the stocking. He continues to have a fairly substantial punched-out circular area on the left posterior calf. This wound only came about in late December, she is not really sure how it happened. 08/13/16; no major change; small punched out wound on the left posterior calf. I have reviewed vascular hx. Followed by Dr. Ronalee Belts, not felt to have PAD 08/21/16; the area on her left posterior calf still a small punched out wound. Debrided this with a #3 curet of surface necrotic material however the wound continues to clean up quite nicely and I think the dimensions overall her smaller. We have been using Promogran. UNFORTUNATELY the patient arrives today with a fairly substantial skin tear on the right anterior lateral leg that was sustained when an aide was helping her put on her compression stockings. This happened 3 days ago. We will continue Promogran to all wound areas now including the right leg again. She is asked to keep the wraps that we put on in  place and we will discharge home health today at her request 08/28/16; the area on her left posterior calf is small punched out but appears to have some improvement still required debridement. The large skin tear on the right lateral leg looks healthy and is smaller no debridement is required. There is no evidence of surrounding cellulitis. Using Promogran on both areas 09/04/16; the area on the left posterior calf continues to gradually improve however the large more recent skin tear on her right lateral leg arrived with a large necrotic surface. We have been using Promogran and that was used to avoid adherence to the right leg wound. 3/1 14/18; the area on the left posterior calf continues to contract. Skin tear on her right lateral leg also looks a better this week. We've been using Promogran [allergic to Silver] 09/18/16; slight change in the left posterior calf. Skin tear on her right leg is down about 50%. We've been using Promogran 09/25/16; improvement in the right posterior laceration wound. This looks like it is progressing towards closure. We are using Promogran here. No change in the small punched out wound on the posterior left calf. Also using Promogran under bilateral compression 10/03/2016 -- the patient normally sees Dr. Dellia Nims and he is here today because of logistic reasons. After reviewing her chart I note that her arterial studies were acceptable as per Dr. Nino Parsley office and he had recommended a 4-layer compression which the patient has not been tolerating very well. She has an allergy to silver and hence we are using plain collagen. She has lymphedema pumps which she is not using as advised and is being rather noncompliant. her son accompanies her today and I have had a detailed discussion about the proper way she should be using her lymphedema pumps and compliance with a 3 or 4 layer compression wrap. 10/09/16; the traumatic wound on the right lateral leg is just about  closed we have been using Promogran. The area on the left posterior calf had gotten smaller but is stalled  in the last 3 weeks also using Promogran. Her son states she is using the compression pumps more religiously on top of her compression wraps Electronic Signature(s) Signed: 10/09/2016 5:13:56 PM By: [name] MD Entered By: [name] on 10/09/2016 17:09:43 Duskin, [name] ([id number]) -------------------------------------------------------------------------------- Physical Exam Details Patient Name: [name], [name] Date of Service: 10/09/2016 3:30 PM Medical Record Patient Account Number: [id number] Number: Treating RN: [name] [birth year] (81 y.o. Other Clinician: Date of Birth/Sex: Female) Treating [name], [name] Primary Care Provider: [name] Provider/Extender: G Referring Provider: [name] in Treatment: 46 Constitutional Sitting or standing Blood Pressure is within target range for patient.. Pulse regular and within target range for patient.Marland Kitchen Respirations regular, non-labored and within target range.. Temperature is normal and within the target range for the patient.. Patient's appearance is neat and clean. Appears in no acute distress. Well nourished and well developed.. Notes Wound exam; the area on the right lateral leg is just about closed. The area on the left posterior Is clean, no debridement was required. She has some degree of lymphedema secondary to chronic venous insufficiency with hemosiderin deposition Electronic Signature(s) Signed: 10/09/2016 5:13:56 PM By: [name] MD Entered By: [name] on 10/09/2016 17:10:26 [name], [name] ([id number]) -------------------------------------------------------------------------------- Physician Orders Details Patient Name: Carrie Mulligan C. Date of Service: 10/09/2016 3:30 PM Medical Record Patient Account Number:  [id number] Number: Treating RN: [name] 01/25/1935 (81 y.o. Other Clinician: Date of Birth/Sex: Female) Treating [name], [name] Primary Care Provider: [name] Provider/Extender: G Referring Provider: [name] in Treatment: 46 Verbal / Phone Orders: Yes Clinician: Carolyne Fiscal, Debi Read Back and Verified: Yes Diagnosis Coding Wound Cleansing Wound #10 Right,Lateral Lower Leg o Clean wound with Normal Saline. o Cleanse wound with mild soap and water o May Shower, gently pat wound dry prior to applying new dressing. Wound #9 Left,Posterior Lower Leg o Clean wound with Normal Saline. o Cleanse wound with mild soap and water o May Shower, gently pat wound dry prior to applying new dressing. Anesthetic Wound #10 Right,Lateral Lower Leg o Topical Lidocaine 4% cream applied to wound bed prior to debridement - for clinic use Wound #9 Left,Posterior Lower Leg o Topical Lidocaine 4% cream applied to wound bed prior to debridement - for clinic use Primary Wound Dressing Wound #10 Right,Lateral Lower Leg o Promogran - plain collagen ****NO SILVER**** o Other: - mepitel Wound #9 Left,Posterior Lower Leg o Other: - PolyMem Secondary Dressing Wound #10 Right,Lateral Lower Leg o ABD pad o Dry Gauze Wound #9 Left,Posterior Lower Leg o ABD pad [name], [name] ([id number]) Dressing Change Frequency Wound #10 Right,Lateral Lower Leg o Change dressing every week - only at wound care center Wound #9 Left,Posterior Lower Leg o Change dressing every week - only at wound care center Follow-up Appointments Wound #10 Right,Lateral Lower Leg o Return Appointment in 1 week. Wound #9 Left,Posterior Lower Leg o Return Appointment in 1 week. Edema Control Wound #10 Right,Lateral Lower Leg o 3 Layer Compression System - Bilateral o Elevate legs to the level of the heart and pump ankles as often as possible Wound #9  Left,Posterior Lower Leg o 3 Layer Compression System - Bilateral o Elevate legs to the level of the heart and pump ankles as often as possible Additional Orders / Instructions Wound #10 Right,Lateral Lower Leg o Increase protein intake. Wound #9 Left,Posterior Lower Leg o Increase protein intake. Electronic Signature(s) Signed: 10/09/2016 4:54:24 PM By: [name] Signed: 10/09/2016 5:13:56 PM By: Dellia Nims,  Legrand Como MD Entered By: [name] on 10/09/2016 16:34:22 Bello, [name] ([id number]) -------------------------------------------------------------------------------- Problem List Details Patient Name: BRIER, FIREBAUGH C. Date of Service: 10/09/2016 3:30 PM Medical Record Patient Account Number: [id number] Number: Treating RN: [name] 02-09-1935 (81 y.o. Other Clinician: Date of Birth/Sex: Female) Treating [name], [name] Primary Care Provider: [name] Provider/Extender: G Referring Provider: [name] in Treatment: 46 Active Problems ICD-10 Encounter Code Description Active Date Diagnosis L97.812 Non-pressure chronic ulcer of other part of right lower leg 04/30/2016 Yes with fat layer exposed I87.333 Chronic venous hypertension (idiopathic) with ulcer and 04/03/2016 Yes inflammation of bilateral lower extremity I89.0 Lymphedema, not elsewhere classified 04/03/2016 Yes F12.19 Chronic systolic (congestive) heart failure 04/03/2016 Yes L97.221 Non-pressure chronic ulcer of left calf limited to 07/03/2016 Yes breakdown of skin Inactive Problems Resolved Problems ICD-10 Code Description Active Date Resolved Date S81.812S Laceration without foreign body, left lower leg, sequela 04/03/2016 04/03/2016 Electronic Signature(s) Signed: 10/09/2016 5:13:56 PM By: [name] MD Entered By: [name] on 10/09/2016 17:08:36 [name], [name] ([id number]) [address], [place].  ([id number]) -------------------------------------------------------------------------------- Progress Note Details Patient Name: Carrie Mulligan C. Date of Service: 10/09/2016 3:30 PM Medical Record Patient Account Number: [id number] Number: Treating RN: [name] 23-Dec-1934 (81 y.o. Other Clinician: Date of Birth/Sex: Female) Treating [name], [name] Primary Care Provider: [name] Provider/Extender: G Referring Provider: [name] in Treatment: 46 Subjective Chief Complaint Information obtained from Patient Patient here for reevaluation of her right lower externally ulcer History of Present Illness (HPI) 11/22/15; this is [place] is a 82 year old woman who lives at home on her own. According to the patient and her daughter was present she has had long-standing edema in her legs dating back many years. She also has a history of chronic systolic heart failure, atrial fibrillation and is status post mitral valve replacement. Last echocardiogram I see in [facility] showed an ejection fraction of 40-45% she is on Lasix 60 mg a day and spironolactone 25 mg a day. Her current problem began in December around Christmas time she developed a small hematoma in the medial part of her left leg which rapidly expanded to a very large hematoma that required surgical debridement. This situation was complicated by the fact that the patient is on long-standing Coumadin for mechanical heart valve. She went to the OR had this evacuated on January 4 /17. The wound has gradually improved however she has developed a small wounds around this area and more recently a wound on the right lateral leg. She is weeping edema fluid. The patient is already been to see vascular surgery. It was recommended that she wear Unna boots, she did not tolerate this due to pain in the left leg. She was then prescribed Juzo stockings and really can't get these on herself although truthfully  there is probably too much edema for a Juzo stockings currently. She is not a diabetic and has no history of PAD or claudication that I could elicit. She does not use the external compression pumps reliably. She comes today with notes from her primary physician and Dr. Ronalee Belts both recommending various forms of compression but the patient does not really complied with them. Has been using the external compression pumps with some regularity but certainly not daily on the right leg and this has helped. I also note that her daughter tells me the history that she does not sleep in bed at home. She has a hospital bed but with her legs up she finds this  painful so she sleeps in the couch sitting up with her legs dependent. 11/29/15; the patient is arrives today accompanied by her son. He expresses satisfaction that she is maintained the compression all week. 12/06/15; the patient has kept her Profore light wraps on, we have good edema control no major change in the wounds we have been using Aquacel 12/13/15; changed to RTD last week. One of the 3 wounds on her left medial leg is healed she has 2 remaining wounds here and one on the right lateral leg. 12/18/2015 -- the patient was at Dr. Nino Parsley office today and he was seeing her for an arterial study. While the wrap was being removed she had an inadvertent laceration of the left proximal anterior leg which bled quite profusely and a compression dressing was applied over this and the patient was here to get her Profore wraps done. I was asked to see the patient to make an assessment and treat appropriately. 12/26/15; the patient's injury on the left proximal leg and Steri-Strips removed after soaking. There is an open area here. The original wounds to still open on the right lateral and left medial leg. [name], [name] ([id number]) 01/03/16 patient's injury on her proximal left leg looks quite good. Still small open area on the medial left leg which  appears to be improving. The area on the right lateral leg still substantially open with no real improvement in wound depth. Her edema control is marginal with a Profore light. We have been using RTD for 3-4 weeks without any major change 01/10/16: wounds without s/s of infection. vascular results are pending regarding arterial studies. 01/17/16; patient comes in today complaining of severe pain however I think most of this is in the right hip not related to her wounds. She continues with a oval-shaped wound on the right lateral leg, trauma to the left anterior leg just below her tibial plateau. She has a smaller eschar on the left anterior leg. She is being using Prisma however she informs [place] today that she is actually allergic to silver, nose this from a previous application at [facility] some years ago 01/24/16; edema is not so well controlled today, I think I reduced her to [place] bilaterally last week. The area which was a scissor injury on her left upper anterior lower leg is fully epithelialized. She only has a small open area remaining on the medial aspect of her left leg. The oval-shaped wound on the right mid lateral leg may be a bit smaller. Debrided of surface slough nonviable subcutaneous tissue. I had changed to collagen 2 weeks ago in an attempt to get this to close 01/31/16 all the patient's wounds on her left leg give healed. We have good edema control with bilateral Profore lites which she has been compliant with. She still has the oval-shaped wound on the right lateral calf allergic even this appears to be slowly improving. The patient has juxtalite stockings at home. She states she thinks she can put these on. She also has external compression pumps at home although I think her compliance with this has not been good in the past. She complains today of edema in her thighs. Tells me she takes 40 mg of Lasix daily 02/21/16; only 1 small wound remains on the lateral aspect of the right  calf. She is using Juzo stockings on the left leg although she complains about difficulty in applying them. She has external compression pumps at home 02/28/16; the small open wound on her right  lateral calf is improved in terms of wound area. It appears that she has a wrap injury on the anterior aspect of the upper leg 03/06/16; the small open wound on her right lateral calf has a very small open area remaining. The wrap injury on the anterior aspect of the upper leg also appears better. She arrives today in clinic with a history of dyspnea with minimal exertion starting with the last 2-4 days. She does not describe chest pain. Her son and our intake nurse think she has facial swelling. She has a history of an artificial mitral valve on Coumadin 03/13/16; the small wound on the right lateral calf is no better this week. The superior wrap injury anteriorly requires debridement of surface eschar and nonviable subcutaneous tissue. She has been to her primary doctor with regards to her dyspnea we identified last week. Per the patient's description her Lasix has been increased 03/21/2016 -- patient of Dr. Dellia Nims who could not keep her appointment yesterday but has come in today with the right lower leg looking good and this is the leg which has been treated in the recent past. However, her left lower extremity is extremely swollen, tender and edematous with redness and discoloration. 03/27/16; there is only 2 small areas remain on the right leg. The edema that was so concerning last week has come down however there is extensive bruising on the lateral left leg medial left foot suggesting that she lost some blood into the leg itself. I checked her hemoglobin on 9/21 was 14.5. Her INR was over 6. Duplex ultrasound was negative for DVT 04/03/16 at this point in time today patient is actually doing substantially better in regard to the wound on the anterior right lower extremity. currently there is no slough  or eschar noted and no evidence of erythema, discharge, or local infection. She is exhibiting no signs of systemic infection. She is tolerating the dressing changes as well as the wrapping at this point in time. 04/24/16; I have not seen Mrs. Pelley for 3 weeks. Apparently she was admitted to hospital for respiratory issues/also apparently has had multiple compression fractures and had kyphoplasty. When she was last here she only had one small open area remaining on the left leg she was using juxtalite stockings. Her son [name], [name] ([id number]) is upset about restarting the Coumadin which she blames or the swelling in her legs. She is on Coumadin for prophylaxis with a chronic artificial heart valve. 04/30/16 at this point in time patient has been tolerating the 3 layer compression wrap as well as the calcium alginate. 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 With tthat being said she does have the continued wound of the left lower extremity as well as the right medial lower extremity. The right side is significantly smaller compared to the left but actually is slough covered. fortunately she has no significant tenderness at rest although with manipulation of the right location of the wound this is significantly tender. 05/07/16 for follow-up evaluation today both the patient's wounds appear to be significantly improved in size compared to last week. She is also having less pain at both locations currently. Her pain level at most is related to be a 1-2 out of 10. She does have some discharge but fortunately no evidence of infection at this point.she also continues to tolerate the compression wraps well. 05/14/16; the patient's wound on the left leg actually looks as though it's on its way to closure. She is using collagen  to this area. She has a small punched out area over the right medial calf. The cause of this is not really clear it has probably 0.4 cm of depth. She has an IV in  her right hand which she states is for IV fluid when she develops low sodiums her potassiums, the etiology of this is not clear 05-21-16 she presents today with continued ulcerations the right lower extremity, posterior aspect. she has been using Iodoflex and compression therapy to the area. She denies any new injuries or trauma. She does have 2 areas proximal to this ulceration of dry crust along with an area to her left posterior lower leg that is purple discolored area appears similar to how her current ulceration started, she denies any trauma or injury to the left posterior leg we will monitor all of these areas. 05/28/16; the patient is down to 1 small open area on the right medial mid calf. This is however larger and in 50% of the surface area deeper approximately 0.4 cm. The reason for this deterioration is unclear. I've gone ahead and done a culture of this area she also tells [place] today that she is short of breath. She is also noted a swelling on her lower left eyelid 06/05/16; the patient is 1 small open area on the right medial mid calf. This looks about the same as last week. The deep area, medial 50% of the wound looks about the same. CandS I did last week was negative. The entire area looks about the same 06/12/16; not much change in this over the course of the last week. Patient comes in today complaining of extreme back pain. Apparently she has either a kyphoplasty or vertebral plasty scheduled at [facility] radiology tomorrow. For this reason no debridement today. We have been using Iodoflex 06/19/16; patient arrived with the surface eschar from last week removed with a curet debrided of subcutaneous tissue. She tolerates this reasonably well. She is still in a skilled facility. States her vertebral plasty last week is made her pain bearable 06/26/16; patient arrived with a new wound on the posterior aspect of her left calf. She notices 3 or 4 days ago but is not certain how this  happened. She states she simply felt a stinging sensation and one day on her foot and then the next day on the back of her calf. 06/28/2016 -- Ms. Reubin Milan called urgently saying that the pain in her left foot was drastically worse over the last several hours and though she removed her compression wrap she wanted to be seen before the long holiday. 07/03/16; she came in to see Dr. Con Memos on 12/29 for pain in her left foot/heel. Nothing much was found at the time and she states that the pain is better but involved her whole foot. She has a wound on the lateral aspect of the left calf and the medial aspect of the right. The medial 1 looks as if it is just about healed however the left one still has considerable edema. 07/09/16; she arrives in clinic today with a dime-sized wound on the left posterior calf and a small linear area on the right medial calf. The area on the left was debrided with a #3 curet nonviable subcutaneous tissue, we are not making progress here. The area on the right was also debrided of surface eschar and nonviable subcutaneous tissue this has some depth to it. 07/16/16; she has a dime size wound on the left posterior calf that has some depth.  This required debridement last week, although I did not feel the need to do this today. We used Iodoflex last week. Small Saliba, Elandra C. ([id number]) area on the right also was not debrided. This looks like it may be close to closing 07/23/16; using Iodoflex to both wound areas. The area on the right posterior/medial calf is just about closed. She still has a fairly substantial wound on the left posterior leg which is still problematic. 07/30/16; the area on the posterior right leg is closed. The patient will graduate to her own pressure stocking. She has a punched-out area on the left mid calf. The surface is better than last week. I did not attempt debridement. Continuing Iodoflex Is complaining about pain in the right lateral ribs this  is not pleuritic. She apparently had a chest x-ray done at home the immobile X x-ray. I don't have access to that report. She is not complaining of inspiratory chest pain cough etc. 08/07/16; the posterior right leg wound is closed and she is wearing a compression stocking her son expresses satisfaction with her compliance with the stocking. He continues to have a fairly substantial punched-out circular area on the left posterior calf. This wound only came about in late December, she is not really sure how it happened. 08/13/16; no major change; small punched out wound on the left posterior calf. I have reviewed vascular hx. Followed by Dr. Ronalee Belts, not felt to have PAD 08/21/16; the area on her left posterior calf still a small punched out wound. Debrided this with a #3 curet of surface necrotic material however the wound continues to clean up quite nicely and I think the dimensions overall her smaller. We have been using Promogran. UNFORTUNATELY the patient arrives today with a fairly substantial skin tear on the right anterior lateral leg that was sustained when an aide was helping her put on her compression stockings. This happened 3 days ago. We will continue Promogran to all wound areas now including the right leg again. She is asked to keep the wraps that we put on in place and we will discharge home health today at her request 08/28/16; the area on her left posterior calf is small punched out but appears to have some improvement still required debridement. The large skin tear on the right lateral leg looks healthy and is smaller no debridement is required. There is no evidence of surrounding cellulitis. Using Promogran on both areas 09/04/16; the area on the left posterior calf continues to gradually improve however the large more recent skin tear on her right lateral leg arrived with a large necrotic surface. We have been using Promogran and that was used to avoid adherence to the right leg  wound. 3/1 14/18; the area on the left posterior calf continues to contract. Skin tear on her right lateral leg also looks a better this week. We've been using Promogran [allergic to Silver] 09/18/16; slight change in the left posterior calf. Skin tear on her right leg is down about 50%. We've been using Promogran 09/25/16; improvement in the right posterior laceration wound. This looks like it is progressing towards closure. We are using Promogran here. No change in the small punched out wound on the posterior left calf. Also using Promogran under bilateral compression 10/03/2016 -- the patient normally sees Dr. Dellia Nims and he is here today because of logistic reasons. After reviewing her chart I note that her arterial studies were acceptable as per Dr. Nino Parsley office and he had recommended a 4-layer compression  which the patient has not been tolerating very well. She has an allergy to silver and hence we are using plain collagen. She has lymphedema pumps which she is not using as advised and is being rather noncompliant. her son accompanies her today and I have had a detailed discussion about the proper way she should be using her lymphedema pumps and compliance with a 3 or 4 layer compression wrap. 10/09/16; the traumatic wound on the right lateral leg is just about closed we have been using Promogran. The area on the left posterior calf had gotten smaller but is stalled in the last 3 weeks also using Promogran. Her son states she is using the compression pumps more religiously on top of her compression wraps [name], [name] ([id number]) Objective Constitutional Sitting or standing Blood Pressure is within target range for patient.. Pulse regular and within target range for patient.Marland Kitchen Respirations regular, non-labored and within target range.. Temperature is normal and within the target range for the patient.. Patient's appearance is neat and clean. Appears in no acute distress.  Well nourished and well developed.. Vitals Time Taken: 3:38 PM, Height: 68 in, Weight: 178 lbs, BMI: 27.1, Temperature: 98.2 F, Pulse: 68 bpm, Respiratory Rate: 16 breaths/min, Blood Pressure: 136/54 mmHg. General Notes: Wound exam; the area on the right lateral leg is just about closed. The area on the left posterior Is clean, no debridement was required. She has some degree of lymphedema secondary to chronic venous insufficiency with hemosiderin deposition Integumentary (Hair, Skin) Wound #10 status is Open. Original cause of wound was Shear/Friction. The wound is located on the Right,Lateral Lower Leg. The wound measures 0.2cm length x 0.2cm width x 0.1cm depth; 0.031cm^2 area and 0.003cm^3 volume. There is no tunneling or undermining noted. There is a large amount of serosanguineous drainage noted. The wound margin is distinct with the outline attached to the wound base. There is large (67-100%) red granulation within the wound bed. There is a small (1-33%) amount of necrotic tissue within the wound bed including Adherent Slough. Periwound temperature was noted as No Abnormality. The periwound has tenderness on palpation. Wound #9 status is Open. Original cause of wound was Gradually Appeared. The wound is located on the Left,Posterior Lower Leg. The wound measures 1cm length x 0.6cm width x 0.1cm depth; 0.471cm^2 area and 0.047cm^3 volume. The wound is limited to skin breakdown. There is no tunneling or undermining noted. There is a large amount of serosanguineous drainage noted. The wound margin is flat and intact. There is large (67-100%) red granulation within the wound bed. There is a small (1-33%) amount of necrotic tissue within the wound bed including Adherent Slough. The periwound skin appearance exhibited: Ecchymosis. Periwound temperature was noted as No Abnormality. The periwound has tenderness on palpation. Assessment Active Problems ICD-10 L97.812 - Non-pressure chronic  ulcer of other part of right lower leg with fat layer exposed I87.333 - Chronic venous hypertension (idiopathic) with ulcer and inflammation of bilateral lower extremity I89.0 - Lymphedema, not elsewhere classified E08.14 - Chronic systolic (congestive) heart failure [name], [name] 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 Cleanse wound with mild soap and water May Shower, gently pat wound dry prior to applying new dressing. Wound #9 Left,Posterior Lower Leg: Clean wound with Normal Saline. Cleanse wound with mild soap and water May Shower, gently pat wound dry prior to applying new dressing. Anesthetic: Wound #  10 Right,Lateral Lower Leg: Topical Lidocaine 4% cream applied to wound bed prior to debridement - for clinic use Wound #9 Left,Posterior Lower Leg: Topical Lidocaine 4% cream applied to wound bed prior to debridement - for clinic use Primary Wound Dressing: Wound #10 Right,Lateral Lower Leg: Promogran - plain collagen ****NO SILVER**** Other: - mepitel Wound #9 Left,Posterior Lower Leg: Other: - PolyMem Secondary Dressing: Wound #10 Right,Lateral Lower Leg: ABD pad Dry Gauze Wound #9 Left,Posterior Lower Leg: ABD pad Dressing Change Frequency: Wound #10 Right,Lateral Lower Leg: Change dressing every week - only at wound care center Wound #9 Left,Posterior Lower Leg: Change dressing every week - only at wound care center Follow-up Appointments: Wound #10 Right,Lateral Lower Leg: Return Appointment in 1 week. Wound #9 Left,Posterior Lower Leg: Return Appointment in 1 week. Edema Control: Wound #10 Right,Lateral Lower Leg: 3 Layer Compression System - Bilateral Elevate legs to the level of the heart and pump ankles as often as possible Wound #9 Left,Posterior Lower Leg: Gieger, Andrea C. ([id number]) 3 Layer Compression  System - Bilateral Elevate legs to the level of the heart and pump ankles as often as possible Additional Orders / Instructions: Wound #10 Right,Lateral Lower Leg: Increase protein intake. Wound #9 Left,Posterior Lower Leg: Increase protein intake. #1 I am continuing the Promogran to the area on the right lateral leg which should be closed by next week #2 the area on the left posterior calf has stalled and I've switched to plain polymen foam dressing #3 she apparently is more religious with her compression pumps on top of 3 layer compression Electronic Signature(s) Signed: 10/09/2016 5:13:56 PM By: [name] MD Entered By: [name] on 10/09/2016 17:11:33 [name], [name] ([id number]) -------------------------------------------------------------------------------- SuperBill Details Patient Name: Carrie Mulligan C. Date of Service: 10/09/2016 Medical Record Patient Account Number: [id number] Number: Treating RN: [name] 06-04-1935 (81 y.o. Other Clinician: Date of Birth/Sex: Female) Treating [name], [name] Primary Care Provider: [name] Provider/Extender: G Referring Provider: [name] in Treatment: 46 Diagnosis Coding ICD-10 Codes Code Description [phone] Non-pressure chronic ulcer of other part of right lower leg with fat layer exposed Chronic venous hypertension (idiopathic) with ulcer and inflammation of bilateral lower I87.333 extremity I89.0 Lymphedema, not elsewhere classified N36.14 Chronic systolic (congestive) heart failure L97.221 Non-pressure chronic ulcer of left calf limited to breakdown of skin Facility Procedures CPT4: Description Modifier Quantity Code [id number] BILATERAL: Application of multi-layer venous compression 1 system; leg (below knee), including ankle and foot. Physician Procedures CPT4: Description Modifier Quantity Code [id number] - WC PHYS LEVEL 2 - EST PT 1 ICD-10 Description Diagnosis  L97.812 Non-pressure chronic ulcer of other part of right lower leg with fat layer exposed L97.221 Non-pressure chronic ulcer of left calf  limited to breakdown of skin Electronic Signature(s) Signed: 10/09/2016 5:13:56 PM By: [name] MD Previous Signature: 10/09/2016 4:54:24 PM Version By: [name] Entered By: [name] on 10/09/2016 17:12:20

## 2016-10-11 NOTE — Progress Notes (Signed)
[name], [name] ([id number]) Visit Report for 10/09/2016 Arrival Information Details Patient Name: [name], PITZ. Date of Service: 10/09/2016 3:30 PM Medical Record Patient Account Number: [id number] Number: Treating RN: [name] [name] 04, 1935 (81 y.o. Other Clinician: Date of Birth/Sex: Female) Treating [name], [facility] Merlinda Wrubel: [name] Charlita Brian/Extender: G Referring Tomeika Weinmann: [name] in Treatment: 46 Visit Information History Since Last Visit All ordered tests and consults were completed: No Patient Arrived: Gilford Rile Added or deleted any medications: No Arrival Time: 15:32 Any new allergies or adverse reactions: No Accompanied By: son Had a fall or experienced change in No Transfer Assistance: EasyPivot Patient activities of daily living that may affect Lift risk of falls: Patient Identification Verified: Yes Signs or symptoms of abuse/neglect since last No Secondary Verification Process Yes visito Completed: Hospitalized since last visit: No Patient Requires Transmission- No Has Dressing in Place as Prescribed: Yes Based Precautions: Has Compression in Place as Prescribed: Yes Patient Has Alerts: Yes Pain Present Now: No Patient Alerts: Patient on Blood Thinner [place]!!! Electronic Signature(s) Signed: 10/09/2016 4:54:24 PM By: [name] Entered By: [name] on 10/09/2016 15:38:19 [place], [name] ([id number]) -------------------------------------------------------------------------------- Encounter Discharge Information Details Patient Name: Carrie Mulligan C. Date of Service: 10/09/2016 3:30 PM Medical Record Patient Account Number: [id number] Number: Treating RN: [name] [name] 30, 1935 (81 y.o. Other Clinician: Date of Birth/Sex: Female) Treating [name], [name] Primary Care Tamyra Fojtik: [name] Khang Hannum/Extender: G Referring Dim Meisinger:  [name] in Treatment: 46 Encounter Discharge Information Items Discharge Pain Level: 0 Discharge Condition: Stable Ambulatory Status: Walker Discharge Destination: Home Transportation: Private Auto Accompanied By: son Schedule Follow-up Appointment: Yes Medication Reconciliation completed No and provided to Patient/Care Arihanna Estabrook: Provided on Clinical Summary of Care: 10/09/2016 Form Type Recipient Paper Patient MS Electronic Signature(s) Signed: 10/09/2016 4:34:58 PM By: Ruthine Dose Entered By: Ruthine Dose on 10/09/2016 16:34:58 [place], [name] ([id number]) -------------------------------------------------------------------------------- Lower Extremity Assessment Details Patient Name: Carrie Mulligan C. Date of Service: 10/09/2016 3:30 PM Medical Record Patient Account Number: [id number] Number: Treating RN: [name] Sep 26, 1934 (81 y.o. Other Clinician: Date of Birth/Sex: Female) Treating [name], [name] Primary Care Jerica Creegan: [name] Caelynn Marshman/Extender: G Referring Nuel Dejaynes: [name] in Treatment: 46 Edema Assessment Assessed: [Left: No] [Right: No] E[Left: dema] [Right: :] Calf Left: Right: Point of Measurement: 34 cm From Medial Instep 30.4 cm 31 cm Ankle Left: Right: Point of Measurement: 10 cm From Medial Instep 21.5 cm 20.6 cm Vascular Assessment Pulses: Dorsalis Pedis Palpable: [Left:Yes] [Right:Yes] Posterior Tibial Extremity colors, hair growth, and conditions: Extremity Color: [Left:Hyperpigmented] [Right:Hyperpigmented] Temperature of Extremity: [Left:Warm] [Right:Warm] Capillary Refill: [Left:< 3 seconds] [Right:< 3 seconds] Electronic Signature(s) Signed: 10/09/2016 4:54:24 PM By: [name] Entered By: [name] on 10/09/2016 16:14:35 [place], [name] ([id number]) -------------------------------------------------------------------------------- Multi Wound Chart Details Patient  Name: Carrie Mulligan C. Date of Service: 10/09/2016 3:30 PM Medical Record Patient Account Number: [id number] Number: Treating RN: [name] February 01, 1935 (81 y.o. Other Clinician: Date of Birth/Sex: Female) Treating [name], [name] Primary Care Afifa Truax: [name] Mckennon Zwart/Extender: G Referring Marites Nath: [name] in Treatment: 46 Vital Signs Height(in): 68 Pulse(bpm): 68 Weight(lbs): 178 Blood Pressure 136/54 (mmHg): Body Mass Index(BMI): 27 Temperature(F): 98.2 Respiratory Rate 16 (breaths/min): Photos: [N/A:N/A] Wound Location: Right Lower Leg - Lateral Left, Posterior Lower Leg N/A Wounding Event: Shear/Friction Gradually Appeared N/A Primary Etiology: Skin Tear Venous Leg Ulcer N/A Comorbid History: Cataracts, Arrhythmia, Cataracts, Arrhythmia, N/A Hypotension, Hypotension, Osteoarthritis Osteoarthritis Date Acquired: 08/20/2016 06/26/2016 N/A Weeks of Treatment:  7 15 N/A Wound Status: Open Open N/A Measurements L x W x D 0.2x0.2x0.1 1x0.6x0.1 N/A (cm) Area (cm) : 0.031 0.471 N/A Volume (cm) : 0.003 0.047 N/A % Reduction in Area: 99.70% 92.00% N/A % Reduction in Volume: 99.70% 92.00% N/A Classification: Partial Thickness Full Thickness Without N/A Exposed Support Structures Exudate Amount: Large Large N/A Exudate Type: Serosanguineous Serosanguineous N/A Exudate Color: red, brown red, brown N/A [name], [name] ([id number]) Wound Margin: Distinct, outline attached Flat and Intact N/A Granulation Amount: Large (67-100%) Large (67-100%) N/A Granulation Quality: Red Red N/A Necrotic Amount: Small (1-33%) Small (1-33%) N/A Epithelialization: None None N/A Periwound Skin Texture: No Abnormalities Noted No Abnormalities Noted N/A Periwound Skin No Abnormalities Noted No Abnormalities Noted N/A Moisture: Periwound Skin Color: No Abnormalities Noted Ecchymosis: Yes N/A Temperature: No Abnormality No Abnormality N/A Tenderness  on Yes Yes N/A Palpation: Wound Preparation: Ulcer Cleansing: Ulcer Cleansing: N/A Rinsed/Irrigated with Rinsed/Irrigated with Saline Saline Topical Anesthetic Topical Anesthetic Applied: Other: lidocaine Applied: Other: lidociane 4% 4% Treatment Notes Wound #10 (Right, Lateral Lower Leg) 1. Cleansed with: Clean wound with Normal Saline Cleanse wound with antibacterial soap and water 2. Anesthetic Topical Lidocaine 4% cream to wound bed prior to debridement 4. Dressing Applied: Promogran 5. Secondary Dressing Applied ABD Pad Dry Gauze 7. Secured with Tape 3 Layer Compression System - Bilateral Notes mepitel Wound #9 (Left, Posterior Lower Leg) 1. Cleansed with: Clean wound with Normal Saline Cleanse wound with antibacterial soap and water 2. Anesthetic Topical Lidocaine 4% cream to wound bed prior to debridement 4. Dressing Applied: Other dressing (specify in notes) 5. Secondary Dressing Applied Bohle, [name] ([id number]) ABD Pad 7. Secured with Tape 3 Layer Compression System - Bilateral Notes PolyMem Electronic Signature(s) Signed: 10/09/2016 5:13:56 PM By: [name] MD Previous Signature: 10/09/2016 4:54:24 PM Version By: [name] Entered By: [name] on 10/09/2016 17:08:42 [place], [name] ([id number]) -------------------------------------------------------------------------------- Multi-Disciplinary Care Plan Details Patient Name: MAKENIZE, MESSMAN. Date of Service: 10/09/2016 3:30 PM Medical Record Patient Account Number: [id number] Number: Treating RN: [name] [birth year] (81 y.o. Other Clinician: Date of Birth/Sex: Female) Treating [name], [name] Primary Care Lovelle Deitrick: [name] Trinady Milewski/Extender: G Referring Ameer Sanden: [name] in Treatment: 46 Active Inactive ` Orientation to the Wound Care Program Nursing Diagnoses: Knowledge deficit related to the wound healing center  program Goals: Patient/caregiver will verbalize understanding of the [place] Date Initiated: 11/22/2015 Target Resolution Date: 09/28/2016 Goal Status: Active Interventions: Provide education on orientation to the wound center Notes: ` Venous Leg Ulcer Nursing Diagnoses: Knowledge deficit related to disease process and management Potential for venous Insuffiency (use before diagnosis confirmed) Goals: Patient will maintain optimal edema control Date Initiated: 11/22/2015 Target Resolution Date: 09/28/2016 Goal Status: Active Patient/caregiver will verbalize understanding of disease process and disease management Date Initiated: 11/22/2015 Target Resolution Date: 09/28/2016 Goal Status: Active Verify adequate tissue perfusion prior to therapeutic compression application Date Initiated: 11/22/2015 Target Resolution Date: 09/28/2016 Goal Status: Active Interventions: KAHEALANI, YANKOVICH ([id number]) Assess peripheral edema status every visit. Compression as ordered Provide education on venous insufficiency Treatment Activities: Non-invasive vascular studies : 11/22/2015 Therapeutic compression applied : 11/22/2015 Notes: ` Wound/Skin Impairment Nursing Diagnoses: Impaired tissue integrity Knowledge deficit related to ulceration/compromised skin integrity Goals: Patient/caregiver will verbalize understanding of skin care regimen Date Initiated: 11/22/2015 Target Resolution Date: 09/28/2016 Goal Status: Active Ulcer/skin breakdown will have a volume reduction of 30% by week 4 Date Initiated: 11/22/2015 Target Resolution Date: 09/28/2016 Goal Status: Active Ulcer/skin  breakdown will have a volume reduction of 50% by week 8 Date Initiated: 11/22/2015 Target Resolution Date: 09/28/2016 Goal Status: Active Ulcer/skin breakdown will have a volume reduction of 80% by week 12 Date Initiated: 11/22/2015 Target Resolution Date: 09/28/2016 Goal Status: Active Ulcer/skin  breakdown will heal within 14 weeks Date Initiated: 11/22/2015 Target Resolution Date: 09/28/2016 Goal Status: Active Interventions: Assess patient/caregiver ability to perform ulcer/skin care regimen upon admission and as needed Assess ulceration(s) every visit Provide education on ulcer and skin care Treatment Activities: Skin care regimen initiated : 11/22/2015 Topical wound management initiated : 11/22/2015 Notes: YVANNA, VIDAS ([id number]) Electronic Signature(s) Signed: 10/09/2016 4:54:24 PM By: [name] Entered By: [name] on 10/09/2016 15:55:12 [place], [name] ([id number]) -------------------------------------------------------------------------------- Pain Assessment Details Patient Name: Carrie Mulligan C. Date of Service: 10/09/2016 3:30 PM Medical Record Patient Account Number: [id number] Number: Treating RN: [name] 1935/04/26 (81 y.o. Other Clinician: Date of Birth/Sex: Female) Treating [name], [name] Primary Care Dillion Stowers: [name] Emori Kamau/Extender: G Referring Vergia Chea: [name] in Treatment: 46 Active Problems Location of Pain Severity and Description of Pain Patient Has Paino No Site Locations With Dressing Change: No Pain Management and Medication Current Pain Management: Electronic Signature(s) Signed: 10/09/2016 4:54:24 PM By: [name] Entered By: [name] on 10/09/2016 15:38:27 [place], [name] ([id number]) -------------------------------------------------------------------------------- Patient/Caregiver Education Details Patient Name: Carrie Kussmaul. Date of Service: 10/09/2016 3:30 PM Medical Record Patient Account Number: [id number] Number: Treating RN: [name] 17-Oct-1934 (81 y.o. Other Clinician: Date of Birth/Gender: Female) Treating [name], [name] Primary Care Physician/Extender: Claudette Laws Physician: Suella [place] in Treatment: 46 Referring  Physician: [name] Education Assessment Education Provided To: Patient Education Topics Provided Wound/Skin Impairment: Handouts: Other: change dressing as ordered Methods: Demonstration, Explain/Verbal Responses: State content correctly Electronic Signature(s) Signed: 10/09/2016 4:54:24 PM By: [name] Entered By: [name] on 10/09/2016 15:55:00 [place], [name] ([id number]) -------------------------------------------------------------------------------- Wound Assessment Details Patient Name: Carrie Mulligan C. Date of Service: 10/09/2016 3:30 PM Medical Record Patient Account Number: [id number] Number: Treating RN: [name] 08/26/1934 (81 y.o. Other Clinician: Date of Birth/Sex: Female) Treating [name], [place] Primary Care Sayan Aldava: [name] Teja Judice/Extender: G Referring Lise Pincus: [name] in Treatment: 46 Wound Status Wound Number: 10 Primary Skin Tear Etiology: Wound Location: Right Lower Leg - Lateral Wound Status: Open Wounding Event: Shear/Friction Comorbid Cataracts, Arrhythmia, Hypotension, Date Acquired: 08/20/2016 History: Osteoarthritis Weeks Of Treatment: 7 Clustered Wound: No Photos Photo Uploaded By: [name] on 10/09/2016 16:44:20 Wound Measurements Length: (cm) 0.2 Width: (cm) 0.2 Depth: (cm) 0.1 Area: (cm) 0.031 Volume: (cm) 0.003 % Reduction in Area: 99.7% % Reduction in Volume: 99.7% Epithelialization: None Tunneling: No Undermining: No Wound Description Classification: Partial Thickness Foul Odor Aft Wound Margin: Distinct, outline attached Slough/Fibrin Exudate Amount: Large Exudate Type: Serosanguineous Exudate Color: red, brown er Cleansing: No o No Wound Bed Granulation Amount: Large (67-100%) Granulation Quality: Red Necrotic Amount: Small (1-33%) [name], [name] ([id number]) Necrotic Quality: Adherent Slough Periwound Skin Texture Texture Color No  Abnormalities Noted: No No Abnormalities Noted: No Moisture Temperature / Pain No Abnormalities Noted: No Temperature: No Abnormality Tenderness on Palpation: Yes Wound Preparation Ulcer Cleansing: Rinsed/Irrigated with Saline Topical Anesthetic Applied: Other: lidocaine 4%, Treatment Notes Wound #10 (Right, Lateral Lower Leg) 1. Cleansed with: Clean wound with Normal Saline Cleanse wound with antibacterial soap and water 2. Anesthetic Topical Lidocaine 4% cream to wound bed prior to debridement 4. Dressing Applied: Promogran 5. Secondary Dressing Applied ABD Pad Dry Gauze 7. Secured  with Tape 3 Layer Compression System - Bilateral Notes mepitel Electronic Signature(s) Signed: 10/09/2016 4:54:24 PM By: [name] Entered By: [name] on 10/09/2016 16:07:48 [place], [place]. ([id number]) -------------------------------------------------------------------------------- Wound Assessment Details Patient Name: [name], Brenlee C. Date of Service: 10/09/2016 3:30 PM Medical Record Patient Account Number: [id number] Number: Treating RN: [name] Mar 15, 1935 (81 y.o. Other Clinician: Date of Birth/Sex: Female) Treating [name], [place] Primary Care Previn Jian: [name] Avis Mcmahill/Extender: G Referring Iyad Deroo: [name] in Treatment: 46 Wound Status Wound Number: 9 Primary Venous Leg Ulcer Etiology: Wound Location: Left, Posterior Lower Leg Wound Status: Open Wounding Event: Gradually Appeared Comorbid Cataracts, Arrhythmia, Hypotension, Date Acquired: 06/26/2016 History: Osteoarthritis Weeks Of Treatment: 15 Clustered Wound: No Photos Photo Uploaded By: [name] on 10/09/2016 16:03:47 Wound Measurements Length: (cm) 1 Width: (cm) 0.6 Depth: (cm) 0.1 Area: (cm) 0.471 Volume: (cm) 0.047 % Reduction in Area: 92% % Reduction in Volume: 92% Epithelialization: None Tunneling: No Undermining: No Wound  Description Full Thickness Without Exposed Classification: Support Structures Wound Margin: Flat and Intact Exudate Large Amount: Exudate Type: Serosanguineous Exudate Color: red, brown Foul Odor After Cleansing: No Wound Bed Granulation Amount: Large (67-100%) Exposed Structure Blitzer, Lya C. ([id number]) Granulation Quality: Red Fascia Exposed: No Necrotic Amount: Small (1-33%) Fat Layer (Subcutaneous Tissue) Exposed: No Necrotic Quality: Adherent Slough Tendon Exposed: No Muscle Exposed: No Joint Exposed: No Bone Exposed: No Limited to Skin Breakdown Periwound Skin Texture Texture Color No Abnormalities Noted: No No Abnormalities Noted: No Ecchymosis: Yes Moisture No Abnormalities Noted: No Temperature / Pain Temperature: No Abnormality Tenderness on Palpation: Yes Wound Preparation Ulcer Cleansing: Rinsed/Irrigated with Saline Topical Anesthetic Applied: Other: lidociane 4%, Treatment Notes Wound #9 (Left, Posterior Lower Leg) 1. Cleansed with: Clean wound with Normal Saline Cleanse wound with antibacterial soap and water 2. Anesthetic Topical Lidocaine 4% cream to wound bed prior to debridement 4. Dressing Applied: Other dressing (specify in notes) 5. Secondary Dressing Applied ABD Pad 7. Secured with Tape 3 Layer Compression System - Bilateral Notes PolyMem Electronic Signature(s) Signed: 10/09/2016 4:54:24 PM By: [name] Entered By: [name] on 10/09/2016 16:04:34 [place], [name] ([id number]) -------------------------------------------------------------------------------- Vitals Details Patient Name: Carrie Mulligan C. Date of Service: 10/09/2016 3:30 PM Medical Record Patient Account Number: [id number] Number: Treating RN: [name] February 22, 1935 (81 y.o. Other Clinician: Date of Birth/Sex: Female) Treating [name], [name] Primary Care Naomee Nowland: [name] Tabytha Gradillas/Extender: G Referring Kymiah Araiza:  [name] in Treatment: 46 Vital Signs Time Taken: 15:38 Temperature (F): 98.2 Height (in): 68 Pulse (bpm): 68 Weight (lbs): 178 Respiratory Rate (breaths/min): 16 Body Mass Index (BMI): 27.1 Blood Pressure (mmHg): 136/54 Reference Range: 80 - 120 mg / dl Electronic Signature(s) Signed: 10/09/2016 4:54:24 PM By: [name] Entered By: [name] on 10/09/2016 15:40:11

## 2016-10-16 ENCOUNTER — Encounter: Payer: Medicare Other | Admitting: Internal Medicine

## 2016-10-16 DIAGNOSIS — I87333 Chronic venous hypertension (idiopathic) with ulcer and inflammation of bilateral lower extremity: Secondary | ICD-10-CM | POA: Diagnosis not present

## 2016-10-17 NOTE — Progress Notes (Signed)
CAMANI, SESAY ([id number]) Visit Report for 10/16/2016 Arrival Information Details Patient Name: [name], [name]. Date of Service: 10/16/2016 1:30 PM Medical Record Patient Account Number: [id number] Number: Treating RN: [name] 1934/07/11 (81 y.o. Other Clinician: Date of Birth/Sex: Female) Treating [name], [place] Primary Care Cu[place] Attar: [name] Wallace Cogliano/Extender: G Referring Yochanan Eddleman: [name] in Treatment: 47 Visit Information History Since Last Visit All ordered tests and consults were completed: No Patient Arrived: Gilford Rile Added or deleted any medications: No Arrival Time: 13:38 Any new allergies or adverse reactions: No Accompanied By: son Had a fall or experienced change in No Transfer Assistance: EasyPivot Patient activities of daily living that may affect Lift risk of falls: Patient Identification Verified: Yes Signs or symptoms of abuse/neglect since last No Secondary Verification Process Yes visito Completed: Hospitalized since last visit: No Patient Requires Transmission- No Has Dressing in Place as Prescribed: Yes Based Precautions: Has Compression in Place as Prescribed: Yes Patient Has Alerts: Yes Pain Present Now: Yes Patient Alerts: Patient on Blood [place]!!! Electronic Signature(s) Signed: 10/16/2016 4:28:23 PM By: [name] Entered By: [name] on 10/16/2016 13:39:21 Symons, [name] ([id number]) -------------------------------------------------------------------------------- Encounter Discharge Information Details Patient Name: Carrie Mulligan C. Date of Service: 10/16/2016 1:30 PM Medical Record Patient Account Number: [id number] Number: Treating RN: [name] 1935-03-23 (81 y.o. Other Clinician: Date of Birth/Sex: Female) Treating [name], [name] Primary Care Avani Sensabaugh: [name] Billy Turvey/Extender: G Referring  Krystian Ferrentino: [name] in Treatment: 47 Encounter Discharge Information Items Discharge Pain Level: 0 Discharge Condition: Stable Ambulatory Status: Walker Discharge Destination: Home Transportation: Private Auto Accompanied By: son Schedule Follow-up Appointment: Yes Medication Reconciliation completed No and provided to Patient/Care Desiree Fleming: Provided on Clinical Summary of Care: 10/16/2016 Form Type Recipient Paper Patient [place] Electronic Signature(s) Signed: 10/16/2016 2:19:28 PM By: Ruthine Dose Entered By: Ruthine Dose on 10/16/2016 14:19:28 Kenan, [name] ([id number]) -------------------------------------------------------------------------------- Lower Extremity Assessment Details Patient Name: Carrie Mulligan C. Date of Service: 10/16/2016 1:30 PM Medical Record Patient Account Number: [id number] Number: Treating RN: [name] 04-18-1935 (81 y.o. Other Clinician: Date of Birth/Sex: Female) Treating [name], [place] Primary Care Jourdin Connors: [name] Kyannah Climer/Extender: G Referring Mattingly Fountaine: [name] in Treatment: 47 Edema Assessment Assessed: [Left: No] [Right: No] E[Left: dema] [Right: :] Calf Left: Right: Point of Measurement: 33 cm From Medial Instep 30.2 cm 30.8 cm Ankle Left: Right: Point of Measurement: 10 cm From Medial Instep 21.2 cm 20.6 cm Vascular Assessment Pulses: Dorsalis Pedis Palpable: [Left:Yes] [Right:Yes] Posterior Tibial Extremity colors, hair growth, and conditions: Extremity Color: [Left:Hyperpigmented] [Right:Hyperpigmented] Temperature of Extremity: [Left:Warm] [Right:Warm] Capillary Refill: [Left:< 3 seconds] [Right:< 3 seconds] Electronic Signature(s) Signed: 10/16/2016 4:28:23 PM By: [name] Entered By: [name] on 10/16/2016 13:56:17 Pyatt, Sharry CMarland Kitchen ([id number]) -------------------------------------------------------------------------------- Multi Wound Chart  Details Patient Name: Carrie Mulligan C. Date of Service: 10/16/2016 1:30 PM Medical Record Patient Account Number: [id number] Number: Treating RN: [name] 25-Mar-1935 (81 y.o. Other Clinician: Date of Birth/Sex: Female) Treating [name], [name] Primary Care Paytience Bures: [name] Kennett Symes/Extender: G Referring Miyeko Mahlum: [name] in Treatment: 47 Vital Signs Height(in): 68 Pulse(bpm): 64 Weight(lbs): 178 Blood Pressure 121/51 (mmHg): Body Mass Index(BMI): 27 Temperature(F): 97.7 Respiratory Rate 16 (breaths/min): Photos: [10:No Photos] [9:No Photos] [N/A:N/A] Wound Location: [10:Right Lower Leg - Lateral] [9:Left Lower Leg - Posterior] [N/A:N/A] Wounding Event: [10:Shear/Friction] [9:Gradually Appeared] [N/A:N/A] Primary Etiology: [10:Skin Tear] [9:Venous Leg Ulcer] [N/A:N/A] Comorbid History: [10:Cataracts, Arrhythmia, Hypotension, Osteoarthritis] [9:Cataracts, Arrhythmia, Hypotension, Osteoarthritis] [N/A:N/A] Date Acquired: [10:08/20/2016] [  9:06/26/2016] [N/A:N/A] Weeks of Treatment: [10:8] [9:16] [N/A:N/A] Wound Status: [10:Healed - Epithelialized] [9:Open] [N/A:N/A] Measurements L x W x D 0x0x0 [9:0.8x0.5x0.2] [N/A:N/A] (cm) Area (cm) : [10:0] [1:7.616] [N/A:N/A] Volume (cm) : [10:0] [9:0.063] [N/A:N/A] % Reduction in Area: [10:100.00%] [9:94.70%] [N/A:N/A] % Reduction in Volume: 100.00% [9:89.30%] [N/A:N/A] Classification: [10:Partial Thickness] [9:Full Thickness Without Exposed Support Structures] [N/A:N/A] Exudate Amount: [10:None Present] [9:Large] [N/A:N/A] Exudate Type: [10:N/A] [9:Serosanguineous] [N/A:N/A] Exudate Color: [10:N/A] [9:red, brown] [N/A:N/A] Wound Margin: [10:N/A] [9:Flat and Intact] [N/A:N/A] Granulation Amount: [10:None Present (0%)] [9:Small (1-33%)] [N/A:N/A] Granulation Quality: [10:N/A] [9:Red] [N/A:N/A] Necrotic Amount: [10:None Present (0%)] [9:Large (67-100%)] [N/A:N/A] Epithelialization: [10:Large  (67-100%)] [9:None] [N/A:N/A] Periwound Skin Texture: No Abnormalities Noted [9:No Abnormalities Noted] [N/A:N/A] Periwound Skin No Abnormalities Noted No Abnormalities Noted N/A Moisture: Periwound Skin Color: No Abnormalities Noted Ecchymosis: Yes N/A Temperature: No Abnormality No Abnormality N/A Tenderness on No Yes N/A Palpation: Wound Preparation: Ulcer Cleansing: Ulcer Cleansing: N/A Rinsed/Irrigated with Rinsed/Irrigated with Saline, Other: soap and Saline water Topical Anesthetic Topical Anesthetic Applied: Other: lidociane Applied: None 4% Treatment Notes Wound #9 (Left, Posterior Lower Leg) 1. Cleansed with: Clean wound with Normal Saline Cleanse wound with antibacterial soap and water 2. Anesthetic Topical Lidocaine 4% cream to wound bed prior to debridement 4. Dressing Applied: Other dressing (specify in notes) 5. Secondary Dressing Applied ABD Pad Dry Gauze 7. Secured with Tape 3 Layer Compression System - Left Lower Extremity Notes PolyMem Electronic Signature(s) Signed: 10/16/2016 5:12:22 PM By: [name] MD Entered By: [name] on 10/16/2016 15:06:57 Kiernan, [name] ([id number]) -------------------------------------------------------------------------------- [place] Details Patient Name: [name], [name]. Date of Service: 10/16/2016 1:30 PM Medical Record Patient Account Number: [id number] Number: Treating RN: [name] 11-25-1934 (81 y.o. Other Clinician: Date of Birth/Sex: Female) Treating [name], [name] Primary Care Skarlette Lattner: [name] Nidia Grogan/Extender: G Referring Hilbert Briggs: [name] in Treatment: 56 Active Inactive ` Orientation to the Wound Care Program Nursing Diagnoses: Knowledge deficit related to the wound healing center program Goals: Patient/caregiver will verbalize understanding of the [place] Date Initiated: 11/22/2015 Target Resolution  Date: 09/28/2016 Goal Status: Active Interventions: Provide education on orientation to the wound center Notes: ` Venous Leg Ulcer Nursing Diagnoses: Knowledge deficit related to disease process and management Potential for venous Insuffiency (use before diagnosis confirmed) Goals: Patient will maintain optimal edema control Date Initiated: 11/22/2015 Target Resolution Date: 09/28/2016 Goal Status: Active Patient/caregiver will verbalize understanding of disease process and disease management Date Initiated: 11/22/2015 Target Resolution Date: 09/28/2016 Goal Status: Active Verify adequate tissue perfusion prior to therapeutic compression application Date Initiated: 11/22/2015 Target Resolution Date: 09/28/2016 Goal Status: Active Interventions: SYNIA, DOUGLASS ([id number]) Assess peripheral edema status every visit. Compression as ordered Provide education on venous insufficiency Treatment Activities: Non-invasive vascular studies : 11/22/2015 Therapeutic compression applied : 11/22/2015 Notes: ` Wound/Skin Impairment Nursing Diagnoses: Impaired tissue integrity Knowledge deficit related to ulceration/compromised skin integrity Goals: Patient/caregiver will verbalize understanding of skin care regimen Date Initiated: 11/22/2015 Target Resolution Date: 09/28/2016 Goal Status: Active Ulcer/skin breakdown will have a volume reduction of 30% by week 4 Date Initiated: 11/22/2015 Target Resolution Date: 09/28/2016 Goal Status: Active Ulcer/skin breakdown will have a volume reduction of 50% by week 8 Date Initiated: 11/22/2015 Target Resolution Date: 09/28/2016 Goal Status: Active Ulcer/skin breakdown will have a volume reduction of 80% by week 12 Date Initiated: 11/22/2015 Target Resolution Date: 09/28/2016 Goal Status: Active Ulcer/skin breakdown will heal within 14 weeks Date Initiated: 11/22/2015 Target Resolution Date: 09/28/2016 Goal Status: Active  Interventions: Assess  patient/caregiver ability to perform ulcer/skin care regimen upon admission and as needed Assess ulceration(s) every visit Provide education on ulcer and skin care Treatment Activities: Skin care regimen initiated : 11/22/2015 Topical wound management initiated : 11/22/2015 Notes: [name], [name] ([id number]) Electronic Signature(s) Signed: 10/16/2016 4:28:23 PM By: [name] Entered By: [name] on 10/16/2016 13:56:30 Gordner, [name] ([id number]) -------------------------------------------------------------------------------- Pain Assessment Details Patient Name: Carrie Mulligan C. Date of Service: 10/16/2016 1:30 PM Medical Record Patient Account Number: [id number] Number: Treating RN: [name] 05/30/1935 (81 y.o. Other Clinician: Date of Birth/Sex: Female) Treating [name], [name] Primary Care Doral Ventrella: [name] Jovoni Borkenhagen/Extender: G Referring Alise Calais: [name] in Treatment: 47 Active Problems Location of Pain Severity and Description of Pain Patient Has Paino Yes Site Locations Pain Location: Generalized Pain Rate the pain. Current Pain Level: 3 Character of Pain Describe the Pain: Aching Pain Management and Medication Current Pain Management: Notes pt states that she is having back pain. Electronic Signature(s) Signed: 10/16/2016 4:28:23 PM By: [name] Entered By: [name] on 10/16/2016 13:39:41 Lottes, [name] ([id number]) -------------------------------------------------------------------------------- Patient/Caregiver Education Details Patient Name: [name] Date of Service: 10/16/2016 1:30 PM Medical Record Patient Account Number: [id number] Number: Treating RN: [name] 02-[name]-1935 (81 y.o. Other Clinician: Date of Birth/Gender: Female) Treating [name], [name] Primary Care Physician/Extender: Claudette Laws Physician: Suella [place] in Treatment: 47 Referring  Physician: [name] Education Assessment Education Provided To: Patient Education Topics Provided Wound/Skin Impairment: Handouts: Other: do not get wrap wet Methods: Demonstration, Explain/Verbal Responses: State content correctly Electronic Signature(s) Signed: 10/16/2016 4:28:23 PM By: [name] Entered By: [name] on 10/16/2016 13:59:00 Norgard, [name] ([id number]) -------------------------------------------------------------------------------- Wound Assessment Details Patient Name: Carrie Mulligan C. Date of Service: 10/16/2016 1:30 PM Medical Record Patient Account Number: [id number] Number: Treating RN: [name] 1934-12-28 (81 y.o. Other Clinician: Date of Birth/Sex: Female) Treating [name], [name] Primary Care Leialoha Hanna: [name] Catlin Doria/Extender: G Referring Lilyana Lippman: [name] in Treatment: 47 Wound Status Wound Number: 10 Primary Skin Tear Etiology: Wound Location: Right Lower Leg - Lateral Wound Status: Healed - Epithelialized Wounding Event: Shear/Friction Comorbid Cataracts, Arrhythmia, Hypotension, Date Acquired: 08/20/2016 History: Osteoarthritis Weeks Of Treatment: 8 Clustered Wound: No Photos Photo Uploaded By: [name] on 10/16/2016 16:25:32 Wound Measurements Length: (cm) 0 % Reduction i Width: (cm) 0 % Reduction i Depth: (cm) 0 Epithelializa Area: (cm) 0 Tunneling: Volume: (cm) 0 Undermining: n Area: 100% n Volume: 100% tion: Large (67-100%) No No Wound Description Classification: Partial Thickness Exudate Amount: None Present Foul Odor After Cleansing: No Slough/Fibrino No Wound Bed Granulation Amount: None Present (0%) Necrotic Amount: None Present (0%) Periwound Skin Texture Texture Color Gerhold, Adisynn C. ([id number]) No Abnormalities Noted: No No Abnormalities Noted: No Moisture Temperature / Pain No Abnormalities Noted: No Temperature: No  Abnormality Wound Preparation Ulcer Cleansing: Rinsed/Irrigated with Saline, Other: soap and water, Topical Anesthetic Applied: None Electronic Signature(s) Signed: 10/16/2016 4:28:23 PM By: [name] Entered By: [name] on 10/16/2016 13:53:40 [place], [name] ([id number]) -------------------------------------------------------------------------------- Wound Assessment Details Patient Name: Carrie Mulligan C. Date of Service: 10/16/2016 1:30 PM Medical Record Patient Account Number: [id number] Number: Treating RN: [name] 1935/01/14 (81 y.o. Other Clinician: Date of Birth/Sex: Female) Treating [name], [name] Primary Care Destiny Hagin: [name] Dejanique Ruehl/Extender: G Referring Kaelea Gathright: [name] in Treatment: 47 Wound Status Wound Number: 9 Primary Venous Leg Ulcer Etiology: Wound Location: Left Lower Leg - Posterior Wound Status: Open Wounding Event: Gradually Appeared Comorbid  Cataracts, Arrhythmia, Hypotension, Date Acquired: 06/26/2016 History: Osteoarthritis Weeks Of Treatment: 16 Clustered Wound: No Photos Photo Uploaded By: [name] on 10/16/2016 16:25:14 Wound Measurements Length: (cm) 0.8 Width: (cm) 0.5 Depth: (cm) 0.2 Area: (cm) 0.314 Volume: (cm) 0.063 % Reduction in Area: 94.7% % Reduction in Volume: 89.3% Epithelialization: None Tunneling: No Undermining: No Wound Description Full Thickness Without Exposed Classification: Support Structures Wound Margin: Flat and Intact Exudate Large Amount: Exudate Type: Serosanguineous Exudate Color: red, brown Foul Odor After Cleansing: No Wound Bed Granulation Amount: Small (1-33%) Exposed Structure Pizzo, Labrittany C. ([id number]) Granulation Quality: Red Fascia Exposed: No Necrotic Amount: Large (67-100%) Fat Layer (Subcutaneous Tissue) Exposed: No Necrotic Quality: Adherent Slough Tendon Exposed: No Muscle Exposed: No Joint Exposed:  No Bone Exposed: No Limited to Skin Breakdown Periwound Skin Texture Texture Color No Abnormalities Noted: No No Abnormalities Noted: No Ecchymosis: Yes Moisture No Abnormalities Noted: No Temperature / Pain Temperature: No Abnormality Tenderness on Palpation: Yes Wound Preparation Ulcer Cleansing: Rinsed/Irrigated with Saline Topical Anesthetic Applied: Other: lidociane 4%, Treatment Notes Wound #9 (Left, Posterior Lower Leg) 1. Cleansed with: Clean wound with Normal Saline Cleanse wound with antibacterial soap and water 2. Anesthetic Topical Lidocaine 4% cream to wound bed prior to debridement 4. Dressing Applied: Other dressing (specify in notes) 5. Secondary Dressing Applied ABD Pad Dry Gauze 7. Secured with Tape 3 Layer Compression System - Left Lower Extremity Notes PolyMem Electronic Signature(s) Signed: 10/16/2016 4:28:23 PM By: [name] Entered By: [name] on 10/16/2016 13:52:33 Ventress, [name] ([id number]) -------------------------------------------------------------------------------- Vitals Details Patient Name: Carrie Mulligan C. Date of Service: 10/16/2016 1:30 PM Medical Record Patient Account Number: [id number] Number: Treating RN: [name] 11/02/1934 (81 y.o. Other Clinician: Date of Birth/Sex: Female) Treating [name], [name] Primary Care Riata Ikeda: [name] Bobbi Kozakiewicz/Extender: G Referring Enma Maeda: [name] in Treatment: 47 Vital Signs Time Taken: 13:40 Temperature (F): 97.7 Height (in): 68 Pulse (bpm): 64 Weight (lbs): 178 Respiratory Rate (breaths/min): 16 Body Mass Index (BMI): 27.1 Blood Pressure (mmHg): 121/51 Reference Range: 80 - 120 mg / dl Electronic Signature(s) Signed: 10/16/2016 4:28:23 PM By: [name] Entered By: [name] on 10/16/2016 13:41:38

## 2016-10-17 NOTE — Progress Notes (Signed)
PREETHI, [place] ([id number]) Visit Report for 10/16/2016 Chief Complaint Document Details Patient Name: [name], [name]. Date of Service: 10/16/2016 1:30 PM Medical Record Patient Account Number: [id number] Number: Treating RN: [name] 05/17/1935 (81 y.o. Other Clinician: Date of Birth/Sex: Female) Treating Makhya Arave Primary Care Provider: [name] Provider/Extender: G Referring Provider: [name] in Treatment: 47 Information Obtained from: Patient Chief Complaint Patient here for reevaluation of her right lower externally ulcer Electronic Signature(s) Signed: 10/16/2016 5:12:22 PM By: [name] MD Entered By: [name] on 10/16/2016 15:07:05 [name], [name] ([id number]) -------------------------------------------------------------------------------- HPI Details Patient Name: [name]. Date of Service: 10/16/2016 1:30 PM Medical Record Patient Account Number: [id number] Number: Treating RN: [name] [birth year] (81 y.o. Other Clinician: Date of Birth/Sex: Female) Treating Suheyla Mortellaro Primary Care Provider: [name] Provider/Extender: G Referring Provider: [name] in Treatment: 47 History of Present Illness HPI Description: 11/22/15; this is Musolf is a 82 year old woman who lives at home on her own. According to the patient and her daughter was present she has had long-standing edema in her legs dating back many years. She also has a history of chronic systolic heart failure, atrial fibrillation and is status post mitral valve replacement. Last echocardiogram I see in [facility] showed an ejection fraction of 40-45% she is on Lasix 60 mg a day and spironolactone 25 mg a day. Her current problem began in December around Christmas time she developed a small hematoma in the medial part of her left leg which rapidly expanded to a very large hematoma that required  surgical debridement. This situation was complicated by the fact that the patient is on long-standing Coumadin for mechanical heart valve. She went to the OR had this evacuated on January 4 /17. The wound has gradually improved however she has developed a small wounds around this area and more recently a wound on the right lateral leg. She is weeping edema fluid. The patient is already been to see vascular surgery. It was recommended that she wear Unna boots, she did not tolerate this due to pain in the left leg. She was then prescribed Juzo stockings and really can't get these on herself although truthfully there is probably too much edema for a Juzo stockings currently. She is not a diabetic and has no history of PAD or claudication that I could elicit. She does not use the external compression pumps reliably. She comes today with notes from her primary physician and Dr. Ronalee Belts both recommending various forms of compression but the patient does not really complied with them. Has been using the external compression pumps with some regularity but certainly not daily on the right leg and this has helped. I also note that her daughter tells me the history that she does not sleep in bed at home. She has a hospital bed but with her legs up she finds this painful so she sleeps in the couch sitting up with her legs dependent. 11/29/15; the patient is arrives today accompanied by her son. He expresses satisfaction that she is maintained the compression all week. 12/06/15; the patient has kept her Profore light wraps on, we have good edema control no major change in the wounds we have been using Aquacel 12/13/15; changed to RTD last week. One of the 3 wounds on her left medial leg is healed she has 2 remaining wounds here and one on the right lateral leg. 12/18/2015 -- the patient was at Dr. Nino Parsley office today and he  was seeing her for an arterial study. While the wrap was being removed she had an  inadvertent laceration of the left proximal anterior leg which bled quite profusely and a compression dressing was applied over this and the patient was here to get her Profore wraps done. I was asked to see the patient to make an assessment and treat appropriately. 12/26/15; the patient's injury on the left proximal leg and Steri-Strips removed after soaking. There is an open area here. The original wounds to still open on the right lateral and left medial leg. 01/03/16 patient's injury on her proximal left leg looks quite good. Still small open area on the medial left leg which appears to be improving. The area on the right lateral leg still substantially open with no real improvement in wound depth. Her edema control is marginal with a Profore light. We have been using RTD for 3-4 weeks without any major change 01/10/16: wounds without s/s of infection. vascular results are pending regarding arterial studies. [name], [name] ([id number]) 01/17/16; patient comes in today complaining of severe pain however I think most of this is in the right hip not related to her wounds. She continues with a oval-shaped wound on the right lateral leg, trauma to the left anterior leg just below her tibial plateau. She has a smaller eschar on the left anterior leg. She is being using Prisma however she informs [place] today that she is actually allergic to silver, nose this from a previous application at [facility] some years ago 01/24/16; edema is not so well controlled today, I think I reduced her to [place] bilaterally last week. The area which was a scissor injury on her left upper anterior lower leg is fully epithelialized. She only has a small open area remaining on the medial aspect of her left leg. The oval-shaped wound on the right mid lateral leg may be a bit smaller. Debrided of surface slough nonviable subcutaneous tissue. I had changed to collagen 2 weeks ago in an attempt to get this to close 01/31/16 all  the patient's wounds on her left leg give healed. We have good edema control with bilateral Profore lites which she has been compliant with. She still has the oval-shaped wound on the right lateral calf allergic even this appears to be slowly improving. The patient has juxtalite stockings at home. She states she thinks she can put these on. She also has external compression pumps at home although I think her compliance with this has not been good in the past. She complains today of edema in her thighs. Tells me she takes 40 mg of Lasix daily 02/21/16; only 1 small wound remains on the lateral aspect of the right calf. She is using Juzo stockings on the left leg although she complains about difficulty in applying them. She has external compression pumps at home 02/28/16; the small open wound on her right lateral calf is improved in terms of wound area. It appears that she has a wrap injury on the anterior aspect of the upper leg 03/06/16; the small open wound on her right lateral calf has a very small open area remaining. The wrap injury on the anterior aspect of the upper leg also appears better. She arrives today in clinic with a history of dyspnea with minimal exertion starting with the last 2-4 days. She does not describe chest pain. Her son and our intake nurse think she has facial swelling. She has a history of an artificial mitral valve on Coumadin  03/13/16; the small wound on the right lateral calf is no better this week. The superior wrap injury anteriorly requires debridement of surface eschar and nonviable subcutaneous tissue. She has been to her primary doctor with regards to her dyspnea we identified last week. Per the patient's description her Lasix has been increased 03/21/2016 -- patient of Dr. Dellia Nims who could not keep her appointment yesterday but has come in today with the right lower leg looking good and this is the leg which has been treated in the recent past. However, her left  lower extremity is extremely swollen, tender and edematous with redness and discoloration. 03/27/16; there is only 2 small areas remain on the right leg. The edema that was so concerning last week has come down however there is extensive bruising on the lateral left leg medial left foot suggesting that she lost some blood into the leg itself. I checked her hemoglobin on 9/21 was 14.5. Her INR was over 6. Duplex ultrasound was negative for DVT 04/03/16 at this point in time today patient is actually doing substantially better in regard to the wound on the anterior right lower extremity. currently there is no slough or eschar noted and no evidence of erythema, discharge, or local infection. She is exhibiting no signs of systemic infection. She is tolerating the dressing changes as well as the wrapping at this point in time. 04/24/16; I have not seen Mrs. Luera for 3 weeks. Apparently she was admitted to hospital for respiratory issues/also apparently has had multiple compression fractures and had kyphoplasty. When she was last here she only had one small open area remaining on the left leg she was using juxtalite stockings. Her son is upset about restarting the Coumadin which she blames or the swelling in her legs. She is on Coumadin for prophylaxis with a chronic artificial heart valve. 04/30/16 at this point in time patient has been tolerating the 3 layer compression wrap as well as the calcium alginate. 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 With tthat being said she does have the Bixler, Marion. ([id number]) continued wound of the left lower extremity as well as the right medial lower extremity. The right side is significantly smaller compared to the left but actually is slough covered. fortunately she has no significant tenderness at rest although with manipulation of the right location of the wound this is significantly tender. 05/07/16 for follow-up evaluation today both the  patient's wounds appear to be significantly improved in size compared to last week. She is also having less pain at both locations currently. Her pain level at most is related to be a 1-2 out of 10. She does have some discharge but fortunately no evidence of infection at this point.she also continues to tolerate the compression wraps well. 05/14/16; the patient's wound on the left leg actually looks as though it's on its way to closure. She is using collagen to this area. She has a small punched out area over the right medial calf. The cause of this is not really clear it has probably 0.4 cm of depth. She has an IV in her right hand which she states is for IV fluid when she develops low sodiums her potassiums, the etiology of this is not clear 05-21-16 she presents today with continued ulcerations the right lower extremity, posterior aspect. she has been using Iodoflex and compression therapy to the area. She denies any new injuries or trauma. She does have 2 areas proximal to this ulceration of dry crust  along with an area to her left posterior lower leg that is purple discolored area appears similar to how her current ulceration started, she denies any trauma or injury to the left posterior leg we will monitor all of these areas. 05/28/16; the patient is down to 1 small open area on the right medial mid calf. This is however larger and in 50% of the surface area deeper approximately 0.4 cm. The reason for this deterioration is unclear. I've gone ahead and done a culture of this area she also tells [place] today that she is short of breath. She is also noted a swelling on her lower left eyelid 06/05/16; the patient is 1 small open area on the right medial mid calf. This looks about the same as last week. The deep area, medial 50% of the wound looks about the same. CandS I did last week was negative. The entire area looks about the same 06/12/16; not much change in this over the course of the last  week. Patient comes in today complaining of extreme back pain. Apparently she has either a kyphoplasty or vertebral plasty scheduled at [facility] radiology tomorrow. For this reason no debridement today. We have been using Iodoflex 06/19/16; patient arrived with the surface eschar from last week removed with a curet debrided of subcutaneous tissue. She tolerates this reasonably well. She is still in a skilled facility. States her vertebral plasty last week is made her pain bearable 06/26/16; patient arrived with a new wound on the posterior aspect of her left calf. She notices 3 or 4 days ago but is not certain how this happened. She states she simply felt a stinging sensation and one day on her foot and then the next day on the back of her calf. 06/28/2016 -- Ms. Reubin Milan called urgently saying that the pain in her left foot was drastically worse over the last several hours and though she removed her compression wrap she wanted to be seen before the long holiday. 07/03/16; she came in to see Dr. Con Memos on 12/29 for pain in her left foot/heel. Nothing much was found at the time and she states that the pain is better but involved her whole foot. She has a wound on the lateral aspect of the left calf and the medial aspect of the right. The medial 1 looks as if it is just about healed however the left one still has considerable edema. 07/09/16; she arrives in clinic today with a dime-sized wound on the left posterior calf and a small linear area on the right medial calf. The area on the left was debrided with a #3 curet nonviable subcutaneous tissue, we are not making progress here. The area on the right was also debrided of surface eschar and nonviable subcutaneous tissue this has some depth to it. 07/16/16; she has a dime size wound on the left posterior calf that has some depth. This required debridement last week, although I did not feel the need to do this today. We used Iodoflex last week.  Small area on the right also was not debrided. This looks like it may be close to closing 07/23/16; using Iodoflex to both wound areas. The area on the right posterior/medial calf is just about closed. She still has a fairly substantial wound on the left posterior leg which is still problematic. 07/30/16; the area on the posterior right leg is closed. The patient will graduate to her own pressure stocking. She has a punched-out area on the left mid calf.  The surface is better than last week. I did not attempt Bade, [name]. ([id number]) debridement. Continuing Iodoflex Is complaining about pain in the right lateral ribs this is not pleuritic. She apparently had a chest x-ray done at home the immobile X x-ray. I don't have access to that report. She is not complaining of inspiratory chest pain cough etc. 08/07/16; the posterior right leg wound is closed and she is wearing a compression stocking her son expresses satisfaction with her compliance with the stocking. He continues to have a fairly substantial punched-out circular area on the left posterior calf. This wound only came about in late December, she is not really sure how it happened. 08/13/16; no major change; small punched out wound on the left posterior calf. I have reviewed vascular hx. Followed by Dr. Ronalee Belts, not felt to have PAD 08/21/16; the area on her left posterior calf still a small punched out wound. Debrided this with a #3 curet of surface necrotic material however the wound continues to clean up quite nicely and I think the dimensions overall her smaller. We have been using Promogran. UNFORTUNATELY the patient arrives today with a fairly substantial skin tear on the right anterior lateral leg that was sustained when an aide was helping her put on her compression stockings. This happened 3 days ago. We will continue Promogran to all wound areas now including the right leg again. She is asked to keep the wraps that we put on in  place and we will discharge home health today at her request 08/28/16; the area on her left posterior calf is small punched out but appears to have some improvement still required debridement. The large skin tear on the right lateral leg looks healthy and is smaller no debridement is required. There is no evidence of surrounding cellulitis. Using Promogran on both areas 09/04/16; the area on the left posterior calf continues to gradually improve however the large more recent skin tear on her right lateral leg arrived with a large necrotic surface. We have been using Promogran and that was used to avoid adherence to the right leg wound. 3/1 14/18; the area on the left posterior calf continues to contract. Skin tear on her right lateral leg also looks a better this week. We've been using Promogran [allergic to Silver] 09/18/16; slight change in the left posterior calf. Skin tear on her right leg is down about 50%. We've been using Promogran 09/25/16; improvement in the right posterior laceration wound. This looks like it is progressing towards closure. We are using Promogran here. No change in the small punched out wound on the posterior left calf. Also using Promogran under bilateral compression 10/03/2016 -- the patient normally sees Dr. Dellia Nims and he is here today because of logistic reasons. After reviewing her chart I note that her arterial studies were acceptable as per Dr. Nino Parsley office and he had recommended a 4-layer compression which the patient has not been tolerating very well. She has an allergy to silver and hence we are using plain collagen. She has lymphedema pumps which she is not using as advised and is being rather noncompliant. her son accompanies her today and I have had a detailed discussion about the proper way she should be using her lymphedema pumps and compliance with a 3 or 4 layer compression wrap. 10/09/16; the traumatic wound on the right lateral leg is just about  closed we have been using Promogran. The area on the left posterior calf had gotten smaller but is stalled  in the last 3 weeks also using Promogran. Her son states she is using the compression pumps more religiously on top of her compression wraps 10/16/16; there traumatic wound on the right lateral leg is closed. The area on the left posterior leg we started on polymen AG last week is smaller and has improved. Electronic Signature(s) Signed: 10/16/2016 5:12:22 PM By: [name] MD Entered By: [name] on 10/16/2016 15:08:08 [name] ([id number]) 101, [name] ([id number]) -------------------------------------------------------------------------------- Physical Exam Details Patient Name: [name], [name]. Date of Service: 10/16/2016 1:30 PM Medical Record Patient Account Number: [id number] Number: Treating RN: [name] December 30, 1934 (81 y.o. Other Clinician: Date of Birth/Sex: Female) Treating Kevontae Burgoon Primary Care Provider: [name] Provider/Extender: G Referring Provider: [name] in Treatment: 47 Constitutional Sitting or standing Blood Pressure is within target range for patient.. Pulse regular and within target range for patient.[name] Respirations regular, non-labored and within target range.. Temperature is normal and within the target range for the patient.. Patient's appearance is neat and clean. Appears in no acute distress. Well nourished and well developed.. Eyes Conjunctivae clear. No discharge.. Cardiovascular Pedal pulses palpable and strong bilaterally.. Chronic venous insufficiency but the edema is well controlled. Lymphatic None palpable in left popliteal ring on all area. Integumentary (Hair, Skin) Other than chronic venous insufficiency no skin changes are seen. Psychiatric No evidence of depression, anxiety, or agitation. Calm, cooperative, and communicative. Appropriate interactions and  affect.. Notes When exam; the area on the right lateral leg is closed the area on the left posterior leg is clean and smaller. No debridement was required. Electronic Signature(s) Signed: 10/16/2016 5:12:22 PM By: [name] MD Entered By: [name] on 10/16/2016 15:09:35 Stangl, [name] ([id number]) -------------------------------------------------------------------------------- Physician Orders Details Patient Name: [name]. Date of Service: 10/16/2016 1:30 PM Medical Record Patient Account Number: [id number] Number: Treating RN: [name] April 12, 1935 (81 y.o. Other Clinician: Date of Birth/Sex: Female) Treating Solan Vosler Primary Care Provider: [name] Provider/Extender: G Referring Provider: [name] in Treatment: 32 Verbal / Phone Orders: Yes Clinician: Carolyne Fiscal, Debi Read Back and Verified: Yes Diagnosis Coding Wound Cleansing Wound #9 Left,Posterior Lower Leg o Clean wound with Normal Saline. o Cleanse wound with mild soap and water o May Shower, gently pat wound dry prior to applying new dressing. Anesthetic Wound #9 Left,Posterior Lower Leg o Topical Lidocaine 4% cream applied to wound bed prior to debridement - for clinic use Primary Wound Dressing Wound #9 Left,Posterior Lower Leg o Other: - PolyMem Secondary Dressing Wound #9 Left,Posterior Lower Leg o ABD pad Dressing Change Frequency Wound #9 Left,Posterior Lower Leg o Change dressing every week - only at wound care center Follow-up Appointments Wound #9 Left,Posterior Lower Leg o Return Appointment in 1 week. Edema Control Wound #9 Left,Posterior Lower Leg o 3 Layer Compression System - Bilateral o Elevate legs to the level of the heart and pump ankles as often as possible o Other: - pt to wear juxtalite on right leg. Additional Orders / Instructions [name], [name]. ([id number]) Wound #9 Left,Posterior Lower  Leg o Increase protein intake. Electronic Signature(s) Signed: 10/16/2016 4:28:23 PM By: [name] Signed: 10/16/2016 5:12:22 PM By: [name] MD Entered By: [name] on 10/16/2016 14:18:53 [name], [name] ([id number]) -------------------------------------------------------------------------------- Problem List Details Patient Name: [name], [name]. Date of Service: 10/16/2016 1:30 PM Medical Record Patient Account Number: [id number] Number: Treating RN: [name] 1934-10-21 (81 y.o. Other Clinician: Date of Birth/Sex: Female) Treating Tarrence Enck  Primary Care Provider: [name] Provider/Extender: G Referring Provider: [name] in Treatment: 47 Active Problems ICD-10 Encounter Code Description Active Date Diagnosis L97.812 Non-pressure chronic ulcer of other part of right lower leg 04/30/2016 Yes with fat layer exposed I87.333 Chronic venous hypertension (idiopathic) with ulcer and 04/03/2016 Yes inflammation of bilateral lower extremity I89.0 Lymphedema, not elsewhere classified 04/03/2016 Yes U27.25 Chronic systolic (congestive) heart failure 04/03/2016 Yes L97.221 Non-pressure chronic ulcer of left calf limited to 07/03/2016 Yes breakdown of skin Inactive Problems Resolved Problems ICD-10 Code Description Active Date Resolved Date S81.812S Laceration without foreign body, left lower leg, sequela 04/03/2016 04/03/2016 Electronic Signature(s) Signed: 10/16/2016 5:12:22 PM By: [name] MD Entered By: [name] on 10/16/2016 15:06:45 [place], [name] ([id number]) 74, [place]. ([id number]) -------------------------------------------------------------------------------- Progress Note Details Patient Name: Carrie Mulligan [name]. Date of Service: 10/16/2016 1:30 PM Medical Record Patient Account Number: [id number] Number: Treating RN: [name] 07/17/1934 (81 y.o. Other  Clinician: Date of Birth/Sex: Female) Treating Anay Rathe Primary Care Provider: [name] Provider/Extender: G Referring Provider: [name] in Treatment: 47 Subjective Chief Complaint Information obtained from Patient Patient here for reevaluation of her right lower externally ulcer History of Present Illness (HPI) 11/22/15; this is Seiler is a 82 year old woman who lives at home on her own. According to the patient and her daughter was present she has had long-standing edema in her legs dating back many years. She also has a history of chronic systolic heart failure, atrial fibrillation and is status post mitral valve replacement. Last echocardiogram I see in [facility] showed an ejection fraction of 40-45% she is on Lasix 60 mg a day and spironolactone 25 mg a day. Her current problem began in December around Christmas time she developed a small hematoma in the medial part of her left leg which rapidly expanded to a very large hematoma that required surgical debridement. This situation was complicated by the fact that the patient is on long-standing Coumadin for mechanical heart valve. She went to the OR had this evacuated on January 4 /17. The wound has gradually improved however she has developed a small wounds around this area and more recently a wound on the right lateral leg. She is weeping edema fluid. The patient is already been to see vascular surgery. It was recommended that she wear Unna boots, she did not tolerate this due to pain in the left leg. She was then prescribed Juzo stockings and really can't get these on herself although truthfully there is probably too much edema for a Juzo stockings currently. She is not a diabetic and has no history of PAD or claudication that I could elicit. She does not use the external compression pumps reliably. She comes today with notes from her primary physician and Dr. Ronalee Belts both recommending various forms of  compression but the patient does not really complied with them. Has been using the external compression pumps with some regularity but certainly not daily on the right leg and this has helped. I also note that her daughter tells me the history that she does not sleep in bed at home. She has a hospital bed but with her legs up she finds this painful so she sleeps in the couch sitting up with her legs dependent. 11/29/15; the patient is arrives today accompanied by her son. He expresses satisfaction that she is maintained the compression all week. 12/06/15; the patient has kept her Profore light wraps on, we have good edema control no major change  in the wounds we have been using Aquacel 12/13/15; changed to RTD last week. One of the 3 wounds on her left medial leg is healed she has 2 remaining wounds here and one on the right lateral leg. 12/18/2015 -- the patient was at Dr. Nino Parsley office today and he was seeing her for an arterial study. While the wrap was being removed she had an inadvertent laceration of the left proximal anterior leg which bled quite profusely and a compression dressing was applied over this and the patient was here to get her Profore wraps done. I was asked to see the patient to make an assessment and treat appropriately. 12/26/15; the patient's injury on the left proximal leg and Steri-Strips removed after soaking. There is an open area here. The original wounds to still open on the right lateral and left medial leg. DIETRA, STOKELY ([id number]) 01/03/16 patient's injury on her proximal left leg looks quite good. Still small open area on the medial left leg which appears to be improving. The area on the right lateral leg still substantially open with no real improvement in wound depth. Her edema control is marginal with a Profore light. We have been using RTD for 3-4 weeks without any major change 01/10/16: wounds without s/s of infection. vascular results are pending  regarding arterial studies. 01/17/16; patient comes in today complaining of severe pain however I think most of this is in the right hip not related to her wounds. She continues with a oval-shaped wound on the right lateral leg, trauma to the left anterior leg just below her tibial plateau. She has a smaller eschar on the left anterior leg. 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 The area which was a scissor injury on her left upper anterior lower leg is fully epithelialized. She only has a small open area remaining on the medial aspect of her left leg. The oval-shaped wound on the right mid lateral leg may be a bit smaller. Debrided of surface slough nonviable subcutaneous tissue. I had changed to collagen 2 weeks ago in an attempt to get this to close 01/31/16 all the patient's wounds on her left leg give healed. We have good edema control with bilateral Profore lites which she has been compliant with. She still has the oval-shaped wound on the right lateral calf allergic even this appears to be slowly improving. The patient has juxtalite stockings at home. She states she thinks she can put these on. She also has external compression pumps at home although I think her compliance with this has not been good in the past. She complains today of edema in her thighs. Tells me she takes 40 mg of Lasix daily 02/21/16; only 1 small wound remains on the lateral aspect of the right calf. She is using Juzo stockings on the left leg although she complains about difficulty in applying them. She has external compression pumps at home 02/28/16; the small open wound on her right lateral calf is improved in terms of wound area. It appears that she has a wrap injury on the anterior aspect of  the upper leg 03/06/16; the small open wound on her right lateral calf has a very small open area remaining. The wrap injury on the anterior aspect of the upper  leg also appears better. She arrives today in clinic with a history of dyspnea with minimal exertion starting with the last 2-4 days. She does not describe chest pain. Her son and our intake nurse think she has facial swelling. She has a history of an artificial mitral valve on Coumadin 03/13/16; the small wound on the right lateral calf is no better this week. The superior wrap injury anteriorly requires debridement of surface eschar and nonviable subcutaneous tissue. She has been to her primary doctor with regards to her dyspnea we identified last week. Per the patient's description her Lasix has been increased 03/21/2016 -- patient of Dr. Dellia Nims who could not keep her appointment yesterday but has come in today with the right lower leg looking good and this is the leg which has been treated in the recent past. However, her left lower extremity is extremely swollen, tender and edematous with redness and discoloration. 03/27/16; there is only 2 small areas remain on the right leg. The edema that was so concerning last week has come down however there is extensive bruising on the lateral left leg medial left foot suggesting that she lost some blood into the leg itself. I checked her hemoglobin on 9/21 was 14.5. Her INR was over 6. Duplex ultrasound was negative for DVT 04/03/16 at this point in time today patient is actually doing substantially better in regard to the wound on the anterior right lower extremity. currently there is no slough or eschar noted and no evidence of erythema, discharge, or local infection. She is exhibiting no signs of systemic infection. She is tolerating the dressing changes as well as the wrapping at this point in time. 04/24/16; I have not seen Mrs. Urquiza for 3 weeks. Apparently she was admitted to hospital for  respiratory issues/also apparently has had multiple compression fractures and had kyphoplasty. When she was last here she only had one small open area remaining on the left leg she was using juxtalite stockings. Her son [name], [name] ([id number]) is upset about restarting the Coumadin which she blames or the swelling in her legs. She is on Coumadin for prophylaxis with a chronic artificial heart valve. 04/30/16 at this point in time patient has been tolerating the 3 layer compression wrap as well as the calcium alginate. 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 With tthat being said she does have the continued wound of the left lower extremity as well as the right medial lower extremity. The right side is significantly smaller compared to the left but actually is slough covered. fortunately she has no significant tenderness at rest although with manipulation of the right location of the wound this is significantly tender. 05/07/16 for follow-up evaluation today both the patient's wounds appear to be significantly improved in size compared to last week. She is also having less pain at both locations currently. Her pain level at most is related to be a 1-2 out of 10. She does have some discharge but fortunately no evidence of infection at this point.she also continues to tolerate the compression wraps well. 05/14/16; the patient's wound on the left leg actually looks as though it's on its way to closure. She is using collagen to this area. She has a small punched out area over the right medial calf. The cause of this is not really clear it has probably 0.4 cm of depth. She has an IV in her right hand which she states is for IV fluid when she develops low sodiums her potassiums,  the etiology of this is not clear 05-21-16 she presents today with continued ulcerations the right lower extremity, posterior aspect. she has been using Iodoflex and compression therapy to the area. She denies any  new injuries or trauma. She does have 2 areas proximal to this ulceration of dry crust along with an area to her left posterior lower leg that is purple discolored area appears similar to how her current ulceration started, she denies any trauma or injury to the left posterior leg we will monitor all of these areas. 05/28/16; the patient is down to 1 small open area on the right medial mid calf. This is however larger and in 50% of the surface area deeper approximately 0.4 cm. The reason for this deterioration is unclear. I've gone ahead and done a culture of this area she also tells [place] today that she is short of breath. She is also noted a swelling on her lower left eyelid 06/05/16; the patient is 1 small open area on the right medial mid calf. This looks about the same as last week. The deep area, medial 50% of the wound looks about the same. CandS I did last week was negative. The entire area looks about the same 06/12/16; not much change in this over the course of the last week. Patient comes in today complaining of extreme back pain. Apparently she has either a kyphoplasty or vertebral plasty scheduled at [facility] radiology tomorrow. For this reason no debridement today. We have been using Iodoflex 06/19/16; patient arrived with the surface eschar from last week removed with a curet debrided of subcutaneous tissue. She tolerates this reasonably well. She is still in a skilled facility. States her vertebral plasty last week is made her pain bearable 06/26/16; patient arrived with a new wound on the posterior aspect of her left calf. She notices 3 or 4 days ago but is not certain how this happened. She states she simply felt a stinging sensation and one day on her foot and then the next day on the back of her calf. 06/28/2016 -- Ms. Reubin Milan called urgently saying that the pain in her left foot was drastically worse over the last several hours and though she removed her compression wrap she  wanted to be seen before the long holiday. 07/03/16; she came in to see Dr. Con Memos on 12/29 for pain in her left foot/heel. Nothing much was found at the time and she states that the pain is better but involved her whole foot. She has a wound on the lateral aspect of the left calf and the medial aspect of the right. The medial 1 looks as if it is just about healed however the left one still has considerable edema. 07/09/16; she arrives in clinic today with a dime-sized wound on the left posterior calf and a small linear area on the right medial calf. The area on the left was debrided with a #3 curet nonviable subcutaneous tissue, we are not making progress here. The area on the right was also debrided of surface eschar and nonviable subcutaneous tissue this has some depth to it. 07/16/16; she has a dime size wound on the left posterior calf that has some depth. This required debridement last week, although I did not feel the need to do this today. We used Iodoflex last week. Small [name], [name]. ([id number]) area on the right also was not debrided. This looks like it may be close to closing 07/23/16; using Iodoflex to both wound areas. The area  on the right posterior/medial calf is just about closed. She still has a fairly substantial wound on the left posterior leg which is still problematic. 07/30/16; the area on the posterior right leg is closed. The patient will graduate to her own pressure stocking. She has a punched-out area on the left mid calf. The surface is better than last week. I did not attempt debridement. Continuing Iodoflex Is complaining about pain in the right lateral ribs this is not pleuritic. She apparently had a chest x-ray done at home the immobile X x-ray. I don't have access to that report. She is not complaining of inspiratory chest pain cough etc. 08/07/16; the posterior right leg wound is closed and she is wearing a compression stocking her son expresses satisfaction  with her compliance with the stocking. He continues to have a fairly substantial punched-out circular area on the left posterior calf. This wound only came about in late December, she is not really sure how it happened. 08/13/16; no major change; small punched out wound on the left posterior calf. I have reviewed vascular hx. Followed by Dr. Ronalee Belts, not felt to have PAD 08/21/16; the area on her left posterior calf still a small punched out wound. Debrided this with a #3 curet of surface necrotic material however the wound continues to clean up quite nicely and I think the dimensions overall her smaller. We have been using Promogran. UNFORTUNATELY the patient arrives today with a fairly substantial skin tear on the right anterior lateral leg that was sustained when an aide was helping her put on her compression stockings. This happened 3 days ago. We will continue Promogran to all wound areas now including the right leg again. She is asked to keep the wraps that we put on in place and we will discharge home health today at her request 08/28/16; the area on her left posterior calf is small punched out but appears to have some improvement still required debridement. The large skin tear on the right lateral leg looks healthy and is smaller no debridement is required. There is no evidence of surrounding cellulitis. Using Promogran on both areas 09/04/16; the area on the left posterior calf continues to gradually improve however the large more recent skin tear on her right lateral leg arrived with a large necrotic surface. We have been using Promogran and that was used to avoid adherence to the right leg wound. 3/1 14/18; the area on the left posterior calf continues to contract. Skin tear on her right lateral leg also looks a better this week. We've been using Promogran [allergic to Silver] 09/18/16; slight change in the left posterior calf. Skin tear on her right leg is down about 50%. We've been using  Promogran 09/25/16; improvement in the right posterior laceration wound. This looks like it is progressing towards closure. We are using Promogran here. No change in the small punched out wound on the posterior left calf. Also using Promogran under bilateral compression 10/03/2016 -- the patient normally sees Dr. Dellia Nims and he is here today because of logistic reasons. After reviewing her chart I note that her arterial studies were acceptable as per Dr. Nino Parsley office and he had recommended a 4-layer compression which the patient has not been tolerating very well. She has an allergy to silver and hence we are using plain collagen. She has lymphedema pumps which she is not using as advised and is being rather noncompliant. her son accompanies her today and I have had a detailed discussion about the  proper way she should be using her lymphedema pumps and compliance with a 3 or 4 layer compression wrap. 10/09/16; the traumatic wound on the right lateral leg is just about closed we have been using Promogran. The area on the left posterior calf had gotten smaller but is stalled in the last 3 weeks also using Promogran. Her son states she is using the compression pumps more religiously on top of her compression wraps 10/16/16; there traumatic wound on the right lateral leg is closed. The area on the left posterior leg we started on polymen AG last week is smaller and has improved. Antkowiak, Rynn [name]. ([id number]) Objective Constitutional Sitting or standing Blood Pressure is within target range for patient.. Pulse regular and within target range for patient.[name] Respirations regular, non-labored and within target range.. Temperature is normal and within the target range for the patient.. Patient's appearance is neat and clean. Appears in no acute distress. Well nourished and well developed.. Vitals Time Taken: 1:40 PM, Height: 68 in, Weight: 178 lbs, BMI: 27.1, Temperature: 97.7 F, Pulse: 64 bpm,  Respiratory Rate: 16 breaths/min, Blood Pressure: 121/51 mmHg. Eyes Conjunctivae clear. No discharge.. Cardiovascular Pedal pulses palpable and strong bilaterally.. Chronic venous insufficiency but the edema is well controlled. Lymphatic None palpable in left popliteal ring on all area. Psychiatric No evidence of depression, anxiety, or agitation. Calm, cooperative, and communicative. Appropriate interactions and affect.. General Notes: When exam; the area on the right lateral leg is closed the area on the left posterior leg is clean and smaller. No debridement was required. Integumentary (Hair, Skin) Other than chronic venous insufficiency no skin changes are seen. Wound #10 status is Healed - Epithelialized. Original cause of wound was Shear/Friction. The wound is located on the Right,Lateral Lower Leg. The wound measures 0cm length x 0cm width x 0cm depth; 0cm^2 area and 0cm^3 volume. There is no tunneling or undermining noted. There is a none present amount of drainage noted. There is no granulation within the wound bed. There is no necrotic tissue within the wound bed. Periwound temperature was noted as No Abnormality. Wound #9 status is Open. Original cause of wound was Gradually Appeared. The wound is located on the Left,Posterior Lower Leg. The wound measures 0.8cm length x 0.5cm width x 0.2cm depth; 0.314cm^2 area and 0.063cm^3 volume. The wound is limited to skin breakdown. There is no tunneling or undermining noted. There is a large amount of serosanguineous drainage noted. The wound margin is flat and intact. There is small (1-33%) red granulation within the wound bed. There is a large (67-100%) amount of necrotic tissue within the wound bed including Adherent Slough. The periwound skin appearance exhibited: CHANDLER, STOFER [name]. ([id number]) Ecchymosis. Periwound temperature was noted as No Abnormality. The periwound has tenderness on palpation. Assessment Active  Problems ICD-10 L97.812 - Non-pressure chronic ulcer of other part of right lower leg with fat layer exposed I87.333 - Chronic venous hypertension (idiopathic) with ulcer and inflammation of bilateral lower extremity I89.0 - Lymphedema, not elsewhere classified E33.29 - Chronic systolic (congestive) heart failure L97.221 - Non-pressure chronic ulcer of left calf limited to breakdown of skin Plan Wound Cleansing: Wound #9 Left,Posterior Lower Leg: Clean wound with Normal Saline. Cleanse wound with mild soap and water May Shower, gently pat wound dry prior to applying new dressing. Anesthetic: Wound #9 Left,Posterior Lower Leg: Topical Lidocaine 4% cream applied to wound bed prior to debridement - for clinic use Primary Wound Dressing: Wound #9 Left,Posterior Lower Leg: Other: - PolyMem Secondary Dressing:  Wound #9 Left,Posterior Lower Leg: ABD pad Dressing Change Frequency: Wound #9 Left,Posterior Lower Leg: Change dressing every week - only at wound care center Follow-up Appointments: Wound #9 Left,Posterior Lower Leg: Return Appointment in 1 week. Edema Control: Wound #9 Left,Posterior Lower Leg: 3 Layer Compression System - Bilateral Elevate legs to the level of the heart and pump ankles as often as possible Other: - pt to wear juxtalite on right leg. Additional Orders / InstructionsKAYELYNN, ABDOU ([id number]) Wound #9 Left,Posterior Lower Leg: Increase protein intake. #1 we will continue with polymen AG to the left posterior leg wound under at 3 wrap. We are making good progress here #2 the area on the right leg is healed and we just discharged her to her juxtalite stockings on the right leg #3 her son reminds [place] today that the patient has external compression pumps at home and she is been more compliant with using these. I think this is contributing to her overall improvement. Electronic Signature(s) Signed: 10/16/2016 5:12:22 PM By: [name] MD Entered  By: [name] on 10/16/2016 15:10:42 Tabone, [name] ([id number]) -------------------------------------------------------------------------------- SuperBill Details Patient Name: [name]. Date of Service: 10/16/2016 Medical Record Patient Account Number: [id number] Number: Treating RN: [name] 10/16/1934 (81 y.o. Other Clinician: Date of Birth/Sex: Female) Treating Jocelin Schuelke Primary Care Provider: [name] Provider/Extender: G Referring Provider: [name] in Treatment: 47 Diagnosis Coding ICD-10 Codes Code Description [phone] Non-pressure chronic ulcer of other part of right lower leg with fat layer exposed Chronic venous hypertension (idiopathic) with ulcer and inflammation of bilateral lower I87.333 extremity I89.0 Lymphedema, not elsewhere classified H74.16 Chronic systolic (congestive) heart failure L97.221 Non-pressure chronic ulcer of left calf limited to breakdown of skin Facility Procedures CPT4: Description Modifier Quantity Code [id number] (Facility Use Only) [phone] - APPLY [place] [facility] 1 LEG Physician Procedures CPT4: Description Modifier Quantity Code [id number] - WC PHYS LEVEL 3 - EST PT 1 ICD-10 Description Diagnosis L97.812 Non-pressure chronic ulcer of other part of right lower leg with fat layer exposed I89.0 Lymphedema, not elsewhere classified Electronic Signature(s) Signed: 10/16/2016 5:12:22 PM By: [name] MD Entered By: [name] on 10/16/2016 15:11:11

## 2016-10-21 ENCOUNTER — Ambulatory Visit (INDEPENDENT_AMBULATORY_CARE_PROVIDER_SITE_OTHER): Payer: Medicare Other | Admitting: Vascular Surgery

## 2016-10-21 ENCOUNTER — Encounter (INDEPENDENT_AMBULATORY_CARE_PROVIDER_SITE_OTHER): Payer: Self-pay | Admitting: Vascular Surgery

## 2016-10-21 VITALS — BP 121/70 | HR 65 | Resp 16 | Ht 64.0 in | Wt 141.0 lb

## 2016-10-21 DIAGNOSIS — I872 Venous insufficiency (chronic) (peripheral): Secondary | ICD-10-CM | POA: Diagnosis not present

## 2016-10-21 DIAGNOSIS — I509 Heart failure, unspecified: Secondary | ICD-10-CM

## 2016-10-21 DIAGNOSIS — I890 Lymphedema, not elsewhere classified: Secondary | ICD-10-CM | POA: Diagnosis not present

## 2016-10-23 ENCOUNTER — Encounter: Payer: Medicare Other | Admitting: Internal Medicine

## 2016-10-23 DIAGNOSIS — I87333 Chronic venous hypertension (idiopathic) with ulcer and inflammation of bilateral lower extremity: Secondary | ICD-10-CM | POA: Diagnosis not present

## 2016-10-24 NOTE — Progress Notes (Signed)
Carrie, [name] ([id number]) Visit Report for 10/23/2016 Arrival Information Details Patient Name: [name], [name]. Date of Service: 10/23/2016 12:30 PM Medical Record Patient Account Number: [id number] Number: Treating RN: [name] January 07, 1935 (82 y.o. Other Clinician: Date of Birth/Sex: Female) Treating [name], [name] Primary Care Torence Palmeri: [name] Amatullah Christy/Extender: G Referring Bentlee [place]: [name] in Treatment: 49 Visit Information History Since Last Visit All ordered tests and consults were completed: No Patient Arrived: Gilford Rile Added or deleted any medications: No Arrival Time: 13:12 Any new allergies or adverse reactions: No Accompanied By: self Had a fall or experienced change in No Transfer Assistance: EasyPivot Patient activities of daily living that may affect Lift risk of falls: Patient Identification Verified: Yes Signs or symptoms of abuse/neglect since last No Secondary Verification Process Yes visito Completed: Has Dressing in Place as Prescribed: Yes Patient Requires Transmission- No Has Compression in Place as Prescribed: Yes Based Precautions: Pain Present Now: No Patient Has Alerts: Yes Patient Alerts: Patient on Blood [place]!!! Electronic Signature(s) Signed: 10/23/2016 4:45:04 PM By: [name] Entered By: [name] on 10/23/2016 13:13:57 Galka, Karely Loletha Grayer ([id number]) -------------------------------------------------------------------------------- Encounter Discharge Information Details Patient Name: Carrie Mulligan C. Date of Service: 10/23/2016 12:30 PM Medical Record Patient Account Number: [id number] Number: Treating RN: [name] 1935/02/26 (82 y.o. Other Clinician: Date of Birth/Sex: Female) Treating [name], [name] Primary Care Melanie Pellot: [name] Ashia Dehner/Extender: G Referring Shalanda Brogden: [name] in  Treatment: 48 Encounter Discharge Information Items Discharge Pain Level: 0 Discharge Condition: Stable Ambulatory Status: Walker Discharge Destination: Home Transportation: Private Auto Accompanied By: son Schedule Follow-up Appointment: Yes Medication Reconciliation completed No and provided to Patient/Care Kaysi Ourada: Provided on Clinical Summary of Care: 10/23/2016 Form Type Recipient Paper Patient [place] Electronic Signature(s) Signed: 10/23/2016 4:45:04 PM By: [name] Previous Signature: 10/23/2016 1:33:04 PM Version By: Ruthine Dose Entered By: [name] on 10/23/2016 14:01:42 Grunewald, Marites Loletha Grayer ([id number]) -------------------------------------------------------------------------------- Lower Extremity Assessment Details Patient Name: [name], [name] Date of Service: 10/23/2016 12:30 PM Medical Record Patient Account Number: [id number] Number: Treating RN: [name] April 20, 1935 (82 y.o. Other Clinician: Date of Birth/Sex: Female) Treating [name], Pegram Primary Care Marillyn Goren: [name] Meagan Ancona/Extender: G Referring Deziree Mokry: [name] in Treatment: 48 Edema Assessment Assessed: [Left: No] [Right: No] E[Left: dema] [Right: :] Calf Left: Right: Point of Measurement: 33 cm From Medial Instep 30.2 cm cm Ankle Left: Right: Point of Measurement: 10 cm From Medial Instep 21.2 cm cm Vascular Assessment Pulses: Dorsalis Pedis Palpable: [Left:Yes] Posterior Tibial Extremity colors, hair growth, and conditions: Extremity Color: [Left:Hyperpigmented] Temperature of Extremity: [Left:Warm] Capillary Refill: [Left:< 3 seconds] Electronic Signature(s) Signed: 10/23/2016 4:45:04 PM By: [name] Entered By: [name] on 10/23/2016 13:23:13 Zeiss, Aprile Loletha Grayer ([id number]) -------------------------------------------------------------------------------- Multi Wound Chart Details Patient Name: Carrie Mulligan C.  Date of Service: 10/23/2016 12:30 PM Medical Record Patient Account Number: [id number] Number: Treating RN: [name] 26-May-1935 (82 y.o. Other Clinician: Date of Birth/Sex: Female) Treating [name], [name] Primary Care Harlo Jaso: [name] Vallen Calabrese/Extender: G Referring Edna Rede: [name] in Treatment: 48 Vital Signs Height(in): 68 Pulse(bpm): 66 Weight(lbs): 178 Blood Pressure 126/51 (mmHg): Body Mass Index(BMI): 27 Temperature(F): 97.7 Respiratory Rate 16 (breaths/min): Photos: [9:No Photos] [N/A:N/A] Wound Location: [9:Left Lower Leg - Posterior] [N/A:N/A] Wounding Event: [9:Gradually Appeared] [N/A:N/A] Primary Etiology: [9:Venous Leg Ulcer] [N/A:N/A] Comorbid History: [9:Cataracts, Arrhythmia, Hypotension, Osteoarthritis] [N/A:N/A] Date Acquired: [9:06/26/2016] [N/A:N/A] Weeks of Treatment: [9:17] [N/A:N/A] Wound Status: [9:Open] [N/A:N/A] Measurements L x W x D 0.2x0.2x0.1 [N/A:N/A] (  cm) Area (cm) : [9:0.031] [N/A:N/A] Volume (cm) : [9:0.003] [N/A:N/A] % Reduction in Area: [9:99.50%] [N/A:N/A] % Reduction in Volume: 99.50% [N/A:N/A] Classification: [9:Full Thickness Without Exposed Support Structures] [N/A:N/A] Exudate Amount: [9:Large] [N/A:N/A] Exudate Type: [9:Serosanguineous] [N/A:N/A] Exudate Color: [9:red, brown] [N/A:N/A] Wound Margin: [9:Flat and Intact] [N/A:N/A] Granulation Amount: [9:Large (67-100%)] [N/A:N/A] Granulation Quality: [9:Red] [N/A:N/A] Necrotic Amount: [9:None Present (0%)] [N/A:N/A] Exposed Structures: [9:Fascia: No Fat Layer (Subcutaneous] [N/A:N/A] Tissue) Exposed: No Tendon: No Muscle: No Joint: No Bone: No Limited to Skin Breakdown Epithelialization: None N/A N/A Periwound Skin Texture: No Abnormalities Noted N/A N/A Periwound Skin No Abnormalities Noted N/A N/A Moisture: Periwound Skin Color: Ecchymosis: Yes N/A N/A Temperature: No Abnormality N/A N/A Tenderness on Yes N/A  N/A Palpation: Wound Preparation: Ulcer Cleansing: N/A N/A Rinsed/Irrigated with Saline Topical Anesthetic Applied: Other: lidociane 4% Treatment Notes Electronic Signature(s) Signed: 10/23/2016 4:45:04 PM By: [name] Entered By: [name] on 10/23/2016 13:24:41 Hannold, [name] ([id number]) -------------------------------------------------------------------------------- Multi-Disciplinary Care Plan Details Patient Name: [name] Date of Service: 10/23/2016 12:30 PM Medical Record Patient Account Number: [id number] Number: Treating RN: [name] July 20, 1934 (81 y.o. Other Clinician: Date of Birth/Sex: Female) Treating [name], [name] Primary Care Sita Mangen: [name] Simi Briel/Extender: G Referring Danyell Shader: [name] in Treatment: 48 Active Inactive ` Orientation to the Wound Care Program Nursing Diagnoses: Knowledge deficit related to the wound healing center program Goals: Patient/caregiver will verbalize understanding of the [place] Date Initiated: 11/22/2015 Target Resolution Date: 09/28/2016 Goal Status: Active Interventions: Provide education on orientation to the wound center Notes: ` Venous Leg Ulcer Nursing Diagnoses: Knowledge deficit related to disease process and management Potential for venous Insuffiency (use before diagnosis confirmed) Goals: Patient will maintain optimal edema control Date Initiated: 11/22/2015 Target Resolution Date: 09/28/2016 Goal Status: Active Patient/caregiver will verbalize understanding of disease process and disease management Date Initiated: 11/22/2015 Target Resolution Date: 09/28/2016 Goal Status: Active Verify adequate tissue perfusion prior to therapeutic compression application Date Initiated: 11/22/2015 Target Resolution Date: 09/28/2016 Goal Status: Active Interventions: EMMAGENE, ORTNER ([id number]) Assess peripheral edema status every  visit. Compression as ordered Provide education on venous insufficiency Treatment Activities: Non-invasive vascular studies : 11/22/2015 Therapeutic compression applied : 11/22/2015 Notes: ` Wound/Skin Impairment Nursing Diagnoses: Impaired tissue integrity Knowledge deficit related to ulceration/compromised skin integrity Goals: Patient/caregiver will verbalize understanding of skin care regimen Date Initiated: 11/22/2015 Target Resolution Date: 09/28/2016 Goal Status: Active Ulcer/skin breakdown will have a volume reduction of 30% by week 4 Date Initiated: 11/22/2015 Target Resolution Date: 09/28/2016 Goal Status: Active Ulcer/skin breakdown will have a volume reduction of 50% by week 8 Date Initiated: 11/22/2015 Target Resolution Date: 09/28/2016 Goal Status: Active Ulcer/skin breakdown will have a volume reduction of 80% by week 12 Date Initiated: 11/22/2015 Target Resolution Date: 09/28/2016 Goal Status: Active Ulcer/skin breakdown will heal within 14 weeks Date Initiated: 11/22/2015 Target Resolution Date: 09/28/2016 Goal Status: Active Interventions: Assess patient/caregiver ability to perform ulcer/skin care regimen upon admission and as needed Assess ulceration(s) every visit Provide education on ulcer and skin care Treatment Activities: Skin care regimen initiated : 11/22/2015 Topical wound management initiated : 11/22/2015 Notes: TANEKIA, RYANS ([id number]) Electronic Signature(s) Signed: 10/23/2016 4:45:04 PM By: [name] Entered By: [name] on 10/23/2016 13:24:05 Welty, [name] ([id number]) -------------------------------------------------------------------------------- Pain Assessment Details Patient Name: Carrie Mulligan C. Date of Service: 10/23/2016 12:30 PM Medical Record Patient Account Number: [id number] Number: Treating RN: [name] [birth year] (81 y.o. Other Clinician: Date of Birth/Sex: Female) Treating [name],  [name] Primary Care Tauren Delbuono: [name] Laramie Gelles/Extender: G Referring Charnise Lovan: [name] in Treatment: 48 Active Problems Location of Pain Severity and Description of Pain Patient Has Paino No Site Locations With Dressing Change: No Pain Management and Medication Current Pain Management: Electronic Signature(s) Signed: 10/23/2016 4:45:04 PM By: [name] Entered By: [name] on 10/23/2016 13:14:04 Benney, [name] ([id number]) -------------------------------------------------------------------------------- Patient/Caregiver Education Details Patient Name: [name] Date of Service: 10/23/2016 12:30 PM Medical Record Patient Account Number: [id number] Number: Treating RN: [name] Mar 27, 1935 (81 y.o. Other Clinician: Date of Birth/Gender: Female) Treating [name], [name] Primary Care Physician/Extender: Claudette Laws Physician: Suella [place] in Treatment: 48 Referring Physician: [name] Education Assessment Education Provided To: Patient Education Topics Provided Wound/Skin Impairment: Handouts: Other: change dressing as ordered Methods: Demonstration, Explain/Verbal Responses: State content correctly Electronic Signature(s) Signed: 10/23/2016 4:45:04 PM By: [name] Entered By: [name] on 10/23/2016 14:01:54 Giebel, [place]. ([id number]) -------------------------------------------------------------------------------- Wound Assessment Details Patient Name: Carrie Mulligan C. Date of Service: 10/23/2016 12:30 PM Medical Record Patient Account Number: [id number] Number: Treating RN: [name] 1935-01-31 (81 y.o. Other Clinician: Date of Birth/Sex: Female) Treating [name], [place] Primary Care Mildreth Reek: [name] Celia Friedland/Extender: G Referring Christino Mcglinchey: [name] in Treatment: 37 Wound Status Wound Number: 9 Primary Venous Leg Ulcer Etiology: Wound  Location: Left Lower Leg - Posterior Wound Status: Open Wounding Event: Gradually Appeared Comorbid Cataracts, Arrhythmia, Hypotension, Date Acquired: 06/26/2016 History: Osteoarthritis Weeks Of Treatment: 17 Clustered Wound: No Photos Photo Uploaded By: [name] on 10/23/2016 16:43:59 Wound Measurements Length: (cm) 0.2 Width: (cm) 0.2 Depth: (cm) 0.1 Area: (cm) 0.031 Volume: (cm) 0.003 % Reduction in Area: 99.5% % Reduction in Volume: 99.5% Epithelialization: None Tunneling: No Undermining: No Wound Description Full Thickness Without Exposed Classification: Support Structures Wound Margin: Flat and Intact Exudate Large Amount: Exudate Type: Serosanguineous Exudate Color: red, brown Foul Odor After Cleansing: No Slough/Fibrino No Wound Bed Granulation Amount: Large (67-100%) Exposed Structure Falls, Roslind C. ([id number]) Granulation Quality: Red Fascia Exposed: No Necrotic Amount: None Present (0%) Fat Layer (Subcutaneous Tissue) Exposed: No Tendon Exposed: No Muscle Exposed: No Joint Exposed: No Bone Exposed: No Limited to Skin Breakdown Periwound Skin Texture Texture Color No Abnormalities Noted: No No Abnormalities Noted: No Ecchymosis: Yes Moisture No Abnormalities Noted: No Temperature / Pain Temperature: No Abnormality Tenderness on Palpation: Yes Wound Preparation Ulcer Cleansing: Rinsed/Irrigated with Saline Topical Anesthetic Applied: Other: lidociane 4%, Treatment Notes Wound #9 (Left, Posterior Lower Leg) 1. Cleansed with: Clean wound with Normal Saline Cleanse wound with antibacterial soap and water 2. Anesthetic Topical Lidocaine 4% cream to wound bed prior to debridement 4. Dressing Applied: Other dressing (specify in notes) 5. Secondary Dressing Applied ABD Pad 7. Secured with Tape 3 Layer Compression System - Left Lower Extremity Notes PolyMem Electronic Signature(s) Signed: 10/23/2016 4:45:04 PM By: [name] Entered By: [name] on 10/23/2016 13:22:43 Shieh, [name] ([id number]) -------------------------------------------------------------------------------- Vitals Details Patient Name: Carrie Mulligan C. Date of Service: 10/23/2016 12:30 PM Medical Record Patient Account Number: [id number] Number: Treating RN: [name] May 18, 1935 (81 y.o. Other Clinician: Date of Birth/Sex: Female) Treating [name], [name] Primary Care Maurya Nethery: [name] Tierre Gerard/Extender: G Referring Demetrios Byron: [name] in Treatment: 48 Vital Signs Time Taken: 13:14 Temperature (F): 97.7 Height (in): 68 Pulse (bpm): 66 Weight (lbs): 178 Respiratory Rate (breaths/min): 16 Body Mass Index (BMI): 27.1 Blood Pressure (mmHg): 126/51 Reference Range: 80 - 120 mg / dl Electronic Signature(s) Signed: 10/23/2016 4:45:04 PM By: Carolyne Fiscal,  Debra Entered By: [name] on 10/23/2016 13:14:57

## 2016-10-24 NOTE — Progress Notes (Addendum)
ARLIS, EVERLY ([id number]) Visit Report for 10/23/2016 Chief Complaint Document Details Patient Name: [name], [name]. Date of Service: 10/23/2016 12:30 PM Medical Record Patient Account Number: [id number] Number: Treating RN: [name] [birth year] (82 y.o. Other Clinician: Date of Birth/Sex: Female) Treating [name], [name] Primary Care Provider: [name] Provider/Extender: G Referring Provider: [name] in Treatment: 48 Information Obtained from: Patient Chief Complaint Patient here for reevaluation of her right lower externally ulcer Electronic Signature(s) Signed: 10/23/2016 4:33:23 PM By: [name] MD Entered By: [name] on 10/23/2016 13:24:45 Romberg, [name] ([id number]) -------------------------------------------------------------------------------- HPI Details Patient Name: [name]. Date of Service: 10/23/2016 12:30 PM Medical Record Patient Account Number: [id number] Number: Treating RN: [name] 08-01-1934 (82 y.o. Other Clinician: Date of Birth/Sex: Female) Treating [name], [name] Primary Care Provider: [name] Provider/Extender: G Referring Provider: [name] in Treatment: 10 History of Present Illness HPI Description: 11/22/15; this is Hitchman is a 82 year old woman who lives at home on her own. According to the patient and her daughter was present she has had long-standing edema in her legs dating back many years. She also has a history of chronic systolic heart failure, atrial fibrillation and is status post mitral valve replacement. Last echocardiogram I see in [facility] showed an ejection fraction of 40-45% she is on Lasix 60 mg a day and spironolactone 25 mg a day. Her current problem began in December around Christmas time she developed a small hematoma in the medial part of her left leg which rapidly expanded to a very large hematoma that required  surgical debridement. This situation was complicated by the fact that the patient is on long-standing Coumadin for mechanical heart valve. She went to the OR had this evacuated on January 4 /17. The wound has gradually improved however she has developed a small wounds around this area and more recently a wound on the right lateral leg. She is weeping edema fluid. The patient is already been to see vascular surgery. It was recommended that she wear Unna boots, she did not tolerate this due to pain in the left leg. She was then prescribed Juzo stockings and really can't get these on herself although truthfully there is probably too much edema for a Juzo stockings currently. She is not a diabetic and has no history of PAD or claudication that I could elicit. She does not use the external compression pumps reliably. She comes today with notes from her primary physician and Dr. Ronalee Belts both recommending various forms of compression but the patient does not really complied with them. Has been using the external compression pumps with some regularity but certainly not daily on the right leg and this has helped. I also note that her daughter tells me the history that she does not sleep in bed at home. She has a hospital bed but with her legs up she finds this painful so she sleeps in the couch sitting up with her legs dependent. 11/29/15; the patient is arrives today accompanied by her son. He expresses satisfaction that she is maintained the compression all week. 12/06/15; the patient has kept her Profore light wraps on, we have good edema control no major change in the wounds we have been using Aquacel 12/13/15; changed to RTD last week. One of the 3 wounds on her left medial leg is healed she has 2 remaining wounds here and one on the right lateral leg. 12/18/2015 -- the patient was at Dr. Nino Parsley office today and he  was seeing her for an arterial study. While the wrap was being removed she had an  inadvertent laceration of the left proximal anterior leg which bled quite profusely and a compression dressing was applied over this and the patient was here to get her Profore wraps done. I was asked to see the patient to make an assessment and treat appropriately. 12/26/15; the patient's injury on the left proximal leg and Steri-Strips removed after soaking. There is an open area here. The original wounds to still open on the right lateral and left medial leg. 01/03/16 patient's injury on her proximal left leg looks quite good. Still small open area on the medial left leg which appears to be improving. The area on the right lateral leg still substantially open with no real improvement in wound depth. Her edema control is marginal with a Profore light. We have been using RTD for 3-4 weeks without any major change 01/10/16: wounds without s/s of infection. vascular results are pending regarding arterial studies. [name], [name] ([id number]) 01/17/16; patient comes in today complaining of severe pain however I think most of this is in the right hip not related to her wounds. She continues with a oval-shaped wound on the right lateral leg, trauma to the left anterior leg just below her tibial plateau. She has a smaller eschar on the left anterior leg. She is being using Prisma however she informs [place] today that she is actually allergic to silver, nose this from a previous application at [facility] some years ago 01/24/16; edema is not so well controlled today, I think I reduced her to Gracey bilaterally last week. The area which was a scissor injury on her left upper anterior lower leg is fully epithelialized. She only has a small open area remaining on the medial aspect of her left leg. The oval-shaped wound on the right mid lateral leg may be a bit smaller. Debrided of surface slough nonviable subcutaneous tissue. I had changed to collagen 2 weeks ago in an attempt to get this to close 01/31/16 all  the patient's wounds on her left leg give healed. We have good edema control with bilateral Profore lites which she has been compliant with. She still has the oval-shaped wound on the right lateral calf allergic even this appears to be slowly improving. The patient has juxtalite stockings at home. She states she thinks she can put these on. She also has external compression pumps at home although I think her compliance with this has not been good in the past. She complains today of edema in her thighs. Tells me she takes 40 mg of Lasix daily 02/21/16; only 1 small wound remains on the lateral aspect of the right calf. She is using Juzo stockings on the left leg although she complains about difficulty in applying them. She has external compression pumps at home 02/28/16; the small open wound on her right lateral calf is improved in terms of wound area. It appears that she has a wrap injury on the anterior aspect of the upper leg 03/06/16; the small open wound on her right lateral calf has a very small open area remaining. The wrap injury on the anterior aspect of the upper leg also appears better. She arrives today in clinic with a history of dyspnea with minimal exertion starting with the last 2-4 days. She does not describe chest pain. Her son and our intake nurse think she has facial swelling. She has a history of an artificial mitral valve on Coumadin  03/13/16; the small wound on the right lateral calf is no better this week. The superior wrap injury anteriorly requires debridement of surface eschar and nonviable subcutaneous tissue. She has been to her primary doctor with regards to her dyspnea we identified last week. Per the patient's description her Lasix has been increased 03/21/2016 -- patient of Dr. Dellia Nims who could not keep her appointment yesterday but has come in today with the right lower leg looking good and this is the leg which has been treated in the recent past. However, her left  lower extremity is extremely swollen, tender and edematous with redness and discoloration. 03/27/16; there is only 2 small areas remain on the right leg. The edema that was so concerning last week has come down however there is extensive bruising on the lateral left leg medial left foot suggesting that she lost some blood into the leg itself. I checked her hemoglobin on 9/21 was 14.5. Her INR was over 6. Duplex ultrasound was negative for DVT 04/03/16 at this point in time today patient is actually doing substantially better in regard to the wound on the anterior right lower extremity. currently there is no slough or eschar noted and no evidence of erythema, discharge, or local infection. She is exhibiting no signs of systemic infection. She is tolerating the dressing changes as well as the wrapping at this point in time. 04/24/16; I have not seen Mrs. Sheffer for 3 weeks. Apparently she was admitted to hospital for respiratory issues/also apparently has had multiple compression fractures and had kyphoplasty. When she was last here she only had one small open area remaining on the left leg she was using juxtalite stockings. Her son is upset about restarting the Coumadin which she blames or the swelling in her legs. She is on Coumadin for prophylaxis with a chronic artificial heart valve. 04/30/16 at this point in time patient has been tolerating the 3 layer compression wrap as well as the calcium alginate. 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 With tthat being said she does have the Tercero, [place]. ([id number]) continued wound of the left lower extremity as well as the right medial lower extremity. The right side is significantly smaller compared to the left but actually is slough covered. fortunately she has no significant tenderness at rest although with manipulation of the right location of the wound this is significantly tender. 05/07/16 for follow-up evaluation today both the  patient's wounds appear to be significantly improved in size compared to last week. She is also having less pain at both locations currently. Her pain level at most is related to be a 1-2 out of 10. She does have some discharge but fortunately no evidence of infection at this point.she also continues to tolerate the compression wraps well. 05/14/16; the patient's wound on the left leg actually looks as though it's on its way to closure. She is using collagen to this area. She has a small punched out area over the right medial calf. The cause of this is not really clear it has probably 0.4 cm of depth. She has an IV in her right hand which she states is for IV fluid when she develops low sodiums her potassiums, the etiology of this is not clear 05-21-16 she presents today with continued ulcerations the right lower extremity, posterior aspect. she has been using Iodoflex and compression therapy to the area. She denies any new injuries or trauma. She does have 2 areas proximal to this ulceration of dry crust  along with an area to her left posterior lower leg that is purple discolored area appears similar to how her current ulceration started, she denies any trauma or injury to the left posterior leg we will monitor all of these areas. 05/28/16; the patient is down to 1 small open area on the right medial mid calf. This is however larger and in 50% of the surface area deeper approximately 0.4 cm. The reason for this deterioration is unclear. I've gone ahead and done a culture of this area she also tells [place] today that she is short of breath. She is also noted a swelling on her lower left eyelid 06/05/16; the patient is 1 small open area on the right medial mid calf. This looks about the same as last week. The deep area, medial 50% of the wound looks about the same. CandS I did last week was negative. The entire area looks about the same 06/12/16; not much change in this over the course of the last  week. Patient comes in today complaining of extreme back pain. Apparently she has either a kyphoplasty or vertebral plasty scheduled at [facility] radiology tomorrow. For this reason no debridement today. We have been using Iodoflex 06/19/16; patient arrived with the surface eschar from last week removed with a curet debrided of subcutaneous tissue. She tolerates this reasonably well. She is still in a skilled facility. States her vertebral plasty last week is made her pain bearable 06/26/16; patient arrived with a new wound on the posterior aspect of her left calf. She notices 3 or 4 days ago but is not certain how this happened. She states she simply felt a stinging sensation and one day on her foot and then the next day on the back of her calf. 06/28/2016 -- Ms. Reubin Milan called urgently saying that the pain in her left foot was drastically worse over the last several hours and though she removed her compression wrap she wanted to be seen before the long holiday. 07/03/16; she came in to see Dr. Con Memos on 12/29 for pain in her left foot/heel. Nothing much was found at the time and she states that the pain is better but involved her whole foot. She has a wound on the lateral aspect of the left calf and the medial aspect of the right. The medial 1 looks as if it is just about healed however the left one still has considerable edema. 07/09/16; she arrives in clinic today with a dime-sized wound on the left posterior calf and a small linear area on the right medial calf. The area on the left was debrided with a #3 curet nonviable subcutaneous tissue, we are not making progress here. The area on the right was also debrided of surface eschar and nonviable subcutaneous tissue this has some depth to it. 07/16/16; she has a dime size wound on the left posterior calf that has some depth. This required debridement last week, although I did not feel the need to do this today. We used Iodoflex last week.  Small area on the right also was not debrided. This looks like it may be close to closing 07/23/16; using Iodoflex to both wound areas. The area on the right posterior/medial calf is just about closed. She still has a fairly substantial wound on the left posterior leg which is still problematic. 07/30/16; the area on the posterior right leg is closed. The patient will graduate to her own pressure stocking. She has a punched-out area on the left mid calf.  The surface is better than last week. I did not attempt Garant, Danyella C. ([id number]) debridement. Continuing Iodoflex Is complaining about pain in the right lateral ribs this is not pleuritic. She apparently had a chest x-ray done at home the immobile X x-ray. I don't have access to that report. She is not complaining of inspiratory chest pain cough etc. 08/07/16; the posterior right leg wound is closed and she is wearing a compression stocking her son expresses satisfaction with her compliance with the stocking. He continues to have a fairly substantial punched-out circular area on the left posterior calf. This wound only came about in late December, she is not really sure how it happened. 08/13/16; no major change; small punched out wound on the left posterior calf. I have reviewed vascular hx. Followed by Dr. Ronalee Belts, not felt to have PAD 08/21/16; the area on her left posterior calf still a small punched out wound. Debrided this with a #3 curet of surface necrotic material however the wound continues to clean up quite nicely and I think the dimensions overall her smaller. We have been using Promogran. UNFORTUNATELY the patient arrives today with a fairly substantial skin tear on the right anterior lateral leg that was sustained when an aide was helping her put on her compression stockings. This happened 3 days ago. We will continue Promogran to all wound areas now including the right leg again. She is asked to keep the wraps that we put on in  place and we will discharge home health today at her request 08/28/16; the area on her left posterior calf is small punched out but appears to have some improvement still required debridement. The large skin tear on the right lateral leg looks healthy and is smaller no debridement is required. There is no evidence of surrounding cellulitis. Using Promogran on both areas 09/04/16; the area on the left posterior calf continues to gradually improve however the large more recent skin tear on her right lateral leg arrived with a large necrotic surface. We have been using Promogran and that was used to avoid adherence to the right leg wound. 3/1 14/18; the area on the left posterior calf continues to contract. Skin tear on her right lateral leg also looks a better this week. We've been using Promogran [allergic to Silver] 09/18/16; slight change in the left posterior calf. Skin tear on her right leg is down about 50%. We've been using Promogran 09/25/16; improvement in the right posterior laceration wound. This looks like it is progressing towards closure. We are using Promogran here. No change in the small punched out wound on the posterior left calf. Also using Promogran under bilateral compression 10/03/2016 -- the patient normally sees Dr. Dellia Nims and he is here today because of logistic reasons. After reviewing her chart I note that her arterial studies were acceptable as per Dr. Nino Parsley office and he had recommended a 4-layer compression which the patient has not been tolerating very well. She has an allergy to silver and hence we are using plain collagen. She has lymphedema pumps which she is not using as advised and is being rather noncompliant. her son accompanies her today and I have had a detailed discussion about the proper way she should be using her lymphedema pumps and compliance with a 3 or 4 layer compression wrap. 10/09/16; the traumatic wound on the right lateral leg is just about  closed we have been using Promogran. The area on the left posterior calf had gotten smaller but is stalled  in the last 3 weeks also using Promogran. Her son states she is using the compression pumps more religiously on top of her compression wraps 10/16/16; there traumatic wound on the right lateral leg is closed. The area on the left posterior leg we started on polymen AG last week is smaller and has improved. 10/23/16/ Using polymen. doing well only small open area remains. Electronic Signature(s) Signed: 10/23/2016 4:33:23 PM By: [name] MD [name], [name] ([id number]) Entered By: [name] on 10/23/2016 13:25:43 [name], [name] ([id number]) -------------------------------------------------------------------------------- Physical Exam Details Patient Name: AILANIE, RUTTAN C. Date of Service: 10/23/2016 12:30 PM Medical Record Patient Account Number: [id number] Number: Treating RN: [name] 1935-05-04 (81 y.o. Other Clinician: Date of Birth/Sex: Female) Treating [name], [name] Primary Care Provider: [name] Provider/Extender: G Referring Provider: [name] in Treatment: 48 Constitutional Sitting or standing Blood Pressure is within target range for patient.. Pulse regular and within target range for patient.Marland Kitchen Respirations regular, non-labored and within target range.. Temperature is normal and within the target range for the patient.. Patient's appearance is neat and clean. Appears in no acute distress. Well nourished and well developed.. Eyes Conjunctivae clear. No discharge.. Notes wound exam; right leg remains closed and the patient has her Juxtalite stocking. Onlly a 'pinhead' sized wound remains on the left posterior calf wound Electronic Signature(s) Signed: 10/23/2016 4:33:23 PM By: [name] MD Entered By: [name] on 10/23/2016 13:27:36 [name], [name]  ([id number]) -------------------------------------------------------------------------------- Physician Orders Details Patient Name: Carrie Mulligan C. Date of Service: 10/23/2016 12:30 PM Medical Record Patient Account Number: [id number] Number: Treating RN: [name] Jul 06, 1934 (81 y.o. Other Clinician: Date of Birth/Sex: Female) Treating [name], [place] Primary Care Provider: [name] Provider/Extender: G Referring Provider: [name] in Treatment: 48 Verbal / Phone Orders: Yes Clinician: Carolyne Fiscal, Debi Read Back and Verified: Yes Diagnosis Coding ICD-10 Coding Code Description [phone] Non-pressure chronic ulcer of other part of right lower leg with fat layer exposed Chronic venous hypertension (idiopathic) with ulcer and inflammation of bilateral lower I87.333 extremity I89.0 Lymphedema, not elsewhere classified J57.01 Chronic systolic (congestive) heart failure L97.221 Non-pressure chronic ulcer of left calf limited to breakdown of skin Wound Cleansing Wound #9 Left,Posterior Lower Leg o Clean wound with Normal Saline. o Cleanse wound with mild soap and water o May Shower, gently pat wound dry prior to applying new dressing. Anesthetic Wound #9 Left,Posterior Lower Leg o Topical Lidocaine 4% cream applied to wound bed prior to debridement - for clinic use Primary Wound Dressing Wound #9 Left,Posterior Lower Leg o Other: - PolyMem Secondary Dressing Wound #9 Left,Posterior Lower Leg o ABD pad Dressing Change Frequency Wound #9 Left,Posterior Lower Leg o Change dressing every week - only at wound care center Follow-up Appointments EMBERLYNN, RIGGAN ([id number]) Wound #9 Left,Posterior Lower Leg o Return Appointment in 1 week. Edema Control Wound #9 Left,Posterior Lower Leg o 3 Layer Compression System - Bilateral o Elevate legs to the level of the heart and pump ankles as often as possible o Other: - pt to  wear juxtalite on right leg. Additional Orders / Instructions Wound #9 Left,Posterior Lower Leg o Increase protein intake. Electronic Signature(s) Signed: 10/23/2016 4:33:23 PM By: [name] MD Signed: 10/23/2016 4:45:04 PM By: [name] Entered By: [name] on 10/23/2016 13:32:21 Murillo, [name] ([id number]) -------------------------------------------------------------------------------- Problem List Details Patient Name: BREYANNA, VALERA C. Date of Service: 10/23/2016 12:30 PM Medical Record Patient Account Number: [id number] Number: Treating RN: [name] [birth year] (81 y.o.  Other Clinician: Date of Birth/Sex: Female) Treating [name], [name] Primary Care Provider: [name] Provider/Extender: G Referring Provider: [name] in Treatment: 48 Active Problems ICD-10 Encounter Code Description Active Date Diagnosis L97.812 Non-pressure chronic ulcer of other part of right lower leg 04/30/2016 Yes with fat layer exposed I87.333 Chronic venous hypertension (idiopathic) with ulcer and 04/03/2016 Yes inflammation of bilateral lower extremity I89.0 Lymphedema, not elsewhere classified 04/03/2016 Yes H47.65 Chronic systolic (congestive) heart failure 04/03/2016 Yes L97.221 Non-pressure chronic ulcer of left calf limited to 07/03/2016 Yes breakdown of skin Inactive Problems Resolved Problems ICD-10 Code Description Active Date Resolved Date S81.812S Laceration without foreign body, left lower leg, sequela 04/03/2016 04/03/2016 Electronic Signature(s) Signed: 10/23/2016 4:33:23 PM By: [name] MD Entered By: [name] on 10/23/2016 13:24:21 [name], [name] ([id number]) [name], [name] ([id number]) -------------------------------------------------------------------------------- Progress Note Details Patient Name: Carrie Mulligan C. Date of Service: 10/23/2016 12:30 PM Medical Record Patient Account Number:  [id number] Number: Treating RN: [name] 05-13-1935 (81 y.o. Other Clinician: Date of Birth/Sex: Female) Treating [name], [name] Primary Care Provider: [name] Provider/Extender: G Referring Provider: [name] in Treatment: 48 Subjective Chief Complaint Information obtained from Patient Patient here for reevaluation of her right lower externally ulcer History of Present Illness (HPI) 11/22/15; this is Berkel is a 82 year old woman who lives at home on her own. According to the patient and her daughter was present she has had long-standing edema in her legs dating back many years. She also has a history of chronic systolic heart failure, atrial fibrillation and is status post mitral valve replacement. Last echocardiogram I see in [facility] showed an ejection fraction of 40-45% she is on Lasix 60 mg a day and spironolactone 25 mg a day. Her current problem began in December around Christmas time she developed a small hematoma in the medial part of her left leg which rapidly expanded to a very large hematoma that required surgical debridement. This situation was complicated by the fact that the patient is on long-standing Coumadin for mechanical heart valve. She went to the OR had this evacuated on January 4 /17. The wound has gradually improved however she has developed a small wounds around this area and more recently a wound on the right lateral leg. She is weeping edema fluid. The patient is already been to see vascular surgery. It was recommended that she wear Unna boots, she did not tolerate this due to pain in the left leg. She was then prescribed Juzo stockings and really can't get these on herself although truthfully there is probably too much edema for a Juzo stockings currently. She is not a diabetic and has no history of PAD or claudication that I could elicit. She does not use the external compression pumps reliably. She comes today with  notes from her primary physician and Dr. Ronalee Belts both recommending various forms of compression but the patient does not really complied with them. Has been using the external compression pumps with some regularity but certainly not daily on the right leg and this has helped. I also note that her daughter tells me the history that she does not sleep in bed at home. She has a hospital bed but with her legs up she finds this painful so she sleeps in the couch sitting up with her legs dependent. 11/29/15; the patient is arrives today accompanied by her son. He expresses satisfaction that she is maintained the compression all week. 12/06/15; the patient has kept her Profore light wraps  on, we have good edema control no major change in the wounds we have been using Aquacel 12/13/15; changed to RTD last week. One of the 3 wounds on her left medial leg is healed she has 2 remaining wounds here and one on the right lateral leg. 12/18/2015 -- the patient was at Dr. Nino Parsley office today and he was seeing her for an arterial study. While the wrap was being removed she had an inadvertent laceration of the left proximal anterior leg which bled quite profusely and a compression dressing was applied over this and the patient was here to get her Profore wraps done. I was asked to see the patient to make an assessment and treat appropriately. 12/26/15; the patient's injury on the left proximal leg and Steri-Strips removed after soaking. There is an open area here. The original wounds to still open on the right lateral and left medial leg. HAELEE, BOLEN ([id number]) 01/03/16 patient's injury on her proximal left leg looks quite good. Still small open area on the medial left leg which appears to be improving. The area on the right lateral leg still substantially open with no real improvement in wound depth. Her edema control is marginal with a Profore light. We have been using RTD for 3-4 weeks without any major  change 01/10/16: wounds without s/s of infection. vascular results are pending regarding arterial studies. 01/17/16; patient comes in today complaining of severe pain however I think most of this is in the right hip not related to her wounds. She continues with a oval-shaped wound on the right lateral leg, trauma to the left anterior leg just below her tibial plateau. She has a smaller eschar on the left anterior leg. 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 The area which was a scissor injury on her left upper anterior lower leg is fully epithelialized. She only has a small open area remaining on the medial aspect of her left leg. The oval-shaped wound on the right mid lateral leg may be a bit smaller. Debrided of surface slough nonviable subcutaneous tissue. I had changed to collagen 2 weeks ago in an attempt to get this to close 01/31/16 all the patient's wounds on her left leg give healed. We have good edema control with bilateral Profore lites which she has been compliant with. She still has the oval-shaped wound on the right lateral calf allergic even this appears to be slowly improving. The patient has juxtalite stockings at home. She states she thinks she can put these on. She also has external compression pumps at home although I think her compliance with this has not been good in the past. She complains today of edema in her thighs. Tells me she takes 40 mg of Lasix daily 02/21/16; only 1 small wound remains on the lateral aspect of the right calf. She is using Juzo stockings on the left leg although she complains about difficulty in applying them. She has external compression pumps at home 02/28/16; the small open wound on her right lateral calf is improved in terms of  wound area. It appears that she has a wrap injury on the anterior aspect of the upper leg 03/06/16; the small open wound on her right lateral calf has a very small open area remaining. The  wrap injury on the anterior aspect of the upper leg also appears better. She arrives today in clinic with a history of dyspnea with minimal exertion starting with the last 2-4 days. She does not describe chest pain. Her son and our intake nurse think she has facial swelling. She has a history of an artificial mitral valve on Coumadin 03/13/16; the small wound on the right lateral calf is no better this week. The superior wrap injury anteriorly requires debridement of surface eschar and nonviable subcutaneous tissue. She has been to her primary doctor with regards to her dyspnea we identified last week. Per the patient's description her Lasix has been increased 03/21/2016 -- patient of Dr. Dellia Nims who could not keep her appointment yesterday but has come in today with the right lower leg looking good and this is the leg which has been treated in the recent past. However, her left lower extremity is extremely swollen, tender and edematous with redness and discoloration. 03/27/16; there is only 2 small areas remain on the right leg. The edema that was so concerning last week has come down however there is extensive bruising on the lateral left leg medial left foot suggesting that she lost some blood into the leg itself. I checked her hemoglobin on 9/21 was 14.5. Her INR was over 6. Duplex ultrasound was negative for DVT 04/03/16 at this point in time today patient is actually doing substantially better in regard to the wound on the anterior right lower extremity. currently there is no slough or eschar noted and no evidence of erythema, discharge, or local infection. She is exhibiting no signs of systemic infection. She is tolerating the dressing changes as well as the wrapping at this point in time. 04/24/16; I have  not seen Mrs. Ebright for 3 weeks. Apparently she was admitted to hospital for respiratory issues/also apparently has had multiple compression fractures and had kyphoplasty. When she was last here she only had one small open area remaining on the left leg she was using juxtalite stockings. Her son [name], [name] ([id number]) is upset about restarting the Coumadin which she blames or the swelling in her legs. She is on Coumadin for prophylaxis with a chronic artificial heart valve. 04/30/16 at this point in time patient has been tolerating the 3 layer compression wrap as well as the calcium alginate. 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 With tthat being said she does have the continued wound of the left lower extremity as well as the right medial lower extremity. The right side is significantly smaller compared to the left but actually is slough covered. fortunately she has no significant tenderness at rest although with manipulation of the right location of the wound this is significantly tender. 05/07/16 for follow-up evaluation today both the patient's wounds appear to be significantly improved in size compared to last week. She is also having less pain at both locations currently. Her pain level at most is related to be a 1-2 out of 10. She does have some discharge but fortunately no evidence of infection at this point.she also continues to tolerate the compression wraps well. 05/14/16; the patient's wound on the left leg actually looks as though it's on its way to closure. She is using collagen to this area. She has a small punched out area over the right medial calf. The cause of this is not really clear it has probably 0.4 cm of depth. She has an IV in her right hand which she states is for  IV fluid when she develops low sodiums her potassiums, the etiology of this is not clear 05-21-16 she presents today with continued ulcerations the right lower extremity, posterior aspect.  she has been using Iodoflex and compression therapy to the area. She denies any new injuries or trauma. She does have 2 areas proximal to this ulceration of dry crust along with an area to her left posterior lower leg that is purple discolored area appears similar to how her current ulceration started, she denies any trauma or injury to the left posterior leg we will monitor all of these areas. 05/28/16; the patient is down to 1 small open area on the right medial mid calf. This is however larger and in 50% of the surface area deeper approximately 0.4 cm. The reason for this deterioration is unclear. I've gone ahead and done a culture of this area she also tells [place] today that she is short of breath. She is also noted a swelling on her lower left eyelid 06/05/16; the patient is 1 small open area on the right medial mid calf. This looks about the same as last week. The deep area, medial 50% of the wound looks about the same. CandS I did last week was negative. The entire area looks about the same 06/12/16; not much change in this over the course of the last week. Patient comes in today complaining of extreme back pain. Apparently she has either a kyphoplasty or vertebral plasty scheduled at [facility] radiology tomorrow. For this reason no debridement today. We have been using Iodoflex 06/19/16; patient arrived with the surface eschar from last week removed with a curet debrided of subcutaneous tissue. She tolerates this reasonably well. She is still in a skilled facility. States her vertebral plasty last week is made her pain bearable 06/26/16; patient arrived with a new wound on the posterior aspect of her left calf. She notices 3 or 4 days ago but is not certain how this happened. She states she simply felt a stinging sensation and one day on her foot and then the next day on the back of her calf. 06/28/2016 -- Ms. Reubin Milan called urgently saying that the pain in her left foot was drastically  worse over the last several hours and though she removed her compression wrap she wanted to be seen before the long holiday. 07/03/16; she came in to see Dr. Con Memos on 12/29 for pain in her left foot/heel. Nothing much was found at the time and she states that the pain is better but involved her whole foot. She has a wound on the lateral aspect of the left calf and the medial aspect of the right. The medial 1 looks as if it is just about healed however the left one still has considerable edema. 07/09/16; she arrives in clinic today with a dime-sized wound on the left posterior calf and a small linear area on the right medial calf. The area on the left was debrided with a #3 curet nonviable subcutaneous tissue, we are not making progress here. The area on the right was also debrided of surface eschar and nonviable subcutaneous tissue this has some depth to it. 07/16/16; she has a dime size wound on the left posterior calf that has some depth. This required debridement last week, although I did not feel the need to do this today. We used Iodoflex last week. Small [name], [name] ([id number]) area on the right also was not debrided. This looks like it may be close to closing  07/23/16; using Iodoflex to both wound areas. The area on the right posterior/medial calf is just about closed. She still has a fairly substantial wound on the left posterior leg which is still problematic. 07/30/16; the area on the posterior right leg is closed. The patient will graduate to her own pressure stocking. She has a punched-out area on the left mid calf. The surface is better than last week. I did not attempt debridement. Continuing Iodoflex Is complaining about pain in the right lateral ribs this is not pleuritic. She apparently had a chest x-ray done at home the immobile X x-ray. I don't have access to that report. She is not complaining of inspiratory chest pain cough etc. 08/07/16; the posterior right leg wound is  closed and she is wearing a compression stocking her son expresses satisfaction with her compliance with the stocking. He continues to have a fairly substantial punched-out circular area on the left posterior calf. This wound only came about in late December, she is not really sure how it happened. 08/13/16; no major change; small punched out wound on the left posterior calf. I have reviewed vascular hx. Followed by Dr. Ronalee Belts, not felt to have PAD 08/21/16; the area on her left posterior calf still a small punched out wound. Debrided this with a #3 curet of surface necrotic material however the wound continues to clean up quite nicely and I think the dimensions overall her smaller. We have been using Promogran. UNFORTUNATELY the patient arrives today with a fairly substantial skin tear on the right anterior lateral leg that was sustained when an aide was helping her put on her compression stockings. This happened 3 days ago. We will continue Promogran to all wound areas now including the right leg again. She is asked to keep the wraps that we put on in place and we will discharge home health today at her request 08/28/16; the area on her left posterior calf is small punched out but appears to have some improvement still required debridement. The large skin tear on the right lateral leg looks healthy and is smaller no debridement is required. There is no evidence of surrounding cellulitis. Using Promogran on both areas 09/04/16; the area on the left posterior calf continues to gradually improve however the large more recent skin tear on her right lateral leg arrived with a large necrotic surface. We have been using Promogran and that was used to avoid adherence to the right leg wound. 3/1 14/18; the area on the left posterior calf continues to contract. Skin tear on her right lateral leg also looks a better this week. We've been using Promogran [allergic to Silver] 09/18/16; slight change in the left  posterior calf. Skin tear on her right leg is down about 50%. We've been using Promogran 09/25/16; improvement in the right posterior laceration wound. This looks like it is progressing towards closure. We are using Promogran here. No change in the small punched out wound on the posterior left calf. Also using Promogran under bilateral compression 10/03/2016 -- the patient normally sees Dr. Dellia Nims and he is here today because of logistic reasons. After reviewing her chart I note that her arterial studies were acceptable as per Dr. Nino Parsley office and he had recommended a 4-layer compression which the patient has not been tolerating very well. She has an allergy to silver and hence we are using plain collagen. She has lymphedema pumps which she is not using as advised and is being rather noncompliant. her son accompanies her today  and I have had a detailed discussion about the proper way she should be using her lymphedema pumps and compliance with a 3 or 4 layer compression wrap. 10/09/16; the traumatic wound on the right lateral leg is just about closed we have been using Promogran. The area on the left posterior calf had gotten smaller but is stalled in the last 3 weeks also using Promogran. Her son states she is using the compression pumps more religiously on top of her compression wraps 10/16/16; there traumatic wound on the right lateral leg is closed. The area on the left posterior leg we started on polymen AG last week is smaller and has improved. [name], [name] ([id number]) 10/23/16/ Using polymen. doing well only small open area remains. Objective Constitutional Sitting or standing Blood Pressure is within target range for patient.. Pulse regular and within target range for patient.Marland Kitchen Respirations regular, non-labored and within target range.. Temperature is normal and within the target range for the patient.. Patient's appearance is neat and clean. Appears in no acute distress.  Well nourished and well developed.. Vitals Time Taken: 1:14 PM, Height: 68 in, Weight: 178 lbs, BMI: 27.1, Temperature: 97.7 F, Pulse: 66 bpm, Respiratory Rate: 16 breaths/min, Blood Pressure: 126/[name] mmHg. Eyes Conjunctivae clear. No discharge.. General Notes: wound exam; right leg remains closed and the patient has her Juxtalite stocking. Onlly a 'pinhead' sized wound remains on the left posterior calf wound Integumentary (Hair, Skin) Wound #9 status is Open. Original cause of wound was Gradually Appeared. The wound is located on the Left,Posterior Lower Leg. The wound measures 0.2cm length x 0.2cm width x 0.1cm depth; 0.031cm^2 area and 0.003cm^3 volume. The wound is limited to skin breakdown. There is no tunneling or undermining noted. There is a large amount of serosanguineous drainage noted. The wound margin is flat and intact. There is large (67-100%) red granulation within the wound bed. There is no necrotic tissue within the wound bed. The periwound skin appearance exhibited: Ecchymosis. Periwound temperature was noted as No Abnormality. The periwound has tenderness on palpation. 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 ([id number]) Plan Wound Cleansing: Wound #9 Left,Posterior Lower Leg: Clean wound with Normal Saline. Cleanse wound with mild soap and water May Shower, gently pat wound dry prior to applying new dressing. Anesthetic: Wound #9 Left,Posterior Lower Leg: Topical Lidocaine 4% cream applied to wound bed prior to debridement - for clinic use Primary Wound Dressing: Wound #9  Left,Posterior Lower Leg: Other: - PolyMem Secondary Dressing: Wound #9 Left,Posterior Lower Leg: ABD pad Dressing Change Frequency: Wound #9 Left,Posterior Lower Leg: Change dressing every week - only at wound care center Follow-up Appointments: Wound #9 Left,Posterior Lower Leg: Return Appointment in 1 week. Edema Control: Wound #9 Left,Posterior Lower Leg: 3 Layer Compression System - Bilateral Elevate legs to the level of the heart and pump ankles as often as possible Other: - pt to wear juxtalite on right leg. Additional Orders / Instructions: Wound #9 Left,Posterior Lower Leg: Increase protein intake. 1 no change to polymen 2 ohealed by next week Electronic Signature(s) Signed: 10/29/2016 4:11:15 PM By: Gretta Cool RN, BSN, Kim RN, BSN Signed: 10/30/2016 7:54:35 AM By: [name] MD Previous Signature:  10/23/2016 4:33:23 PM Version By: [name] MD ZYLEE, MARCHIANO ([id number]) Entered By: Gretta Cool RN, BSN, Kim on 10/29/2016 16:11:15 Stacks, [name] ([id number]) -------------------------------------------------------------------------------- SuperBill Details Patient Name: [name] Date of Service: 10/23/2016 Medical Record Patient Account Number: [id number] Number: Treating RN: [name] [birth year] (81 y.o. Other Clinician: Date of Birth/Sex: Female) Treating [name], [name] Primary Care Provider: [name] Provider/Extender: G Referring Provider: [name] in Treatment: 48 Diagnosis Coding ICD-10 Codes Code Description [phone] Non-pressure chronic ulcer of other part of right lower leg with fat layer exposed Chronic venous hypertension (idiopathic) with ulcer and inflammation of bilateral lower I87.333 extremity I89.0 Lymphedema, not elsewhere classified D32.67 Chronic systolic (congestive) heart failure L97.221 Non-pressure chronic ulcer of left calf limited to breakdown of skin Facility Procedures CPT4:  Description Modifier Quantity Code [id number] (Facility Use Only) [phone] - APPLY [place] [facility] 1 LEG Physician Procedures CPT4 Code Description: [id number] - WC PHYS LEVEL 2 - EST PT ICD-10 Description Diagnosis L97.221 Non-pressure chronic ulcer of left calf limited to Modifier: breakdown of ski Quantity: 1 n Electronic Signature(s) Signed: 10/23/2016 4:33:23 PM By: [name] MD Signed: 10/23/2016 4:45:04 PM By: [name] Entered By: [name] on 10/23/2016 14:01:18

## 2016-10-27 DIAGNOSIS — I872 Venous insufficiency (chronic) (peripheral): Secondary | ICD-10-CM | POA: Insufficient documentation

## 2016-10-27 NOTE — Progress Notes (Signed)
MRN : [id number]  [name] is a 82 y.o. (02-[name]-1935) female who presents with chief complaint of  Chief Complaint  Patient presents with  . Re-evaluation    6 month no studies  .  History of Present Illness: The patient returns to the office for followup evaluation regarding leg swelling.  The swelling has improved quite a bit and the pain associated with swelling has decreased substantially. There have not been any interval development of a ulcerations or wounds.  Since the previous visit the patient has been wearing graduated compression stockings and has noted little significant improvement in the lymphedema. The patient has been using compression routinely morning until night.  The patient also states elevation during the day and exercise is being done too.    Current Meds  Medication Sig  . acetaminophen (TYLENOL) 650 MG CR tablet Take 650 mg by mouth every 8 (eight) hours as needed for pain.  Marland Kitchen amiodarone (PACERONE) 200 MG tablet Take 1 tablet (200 mg total) by mouth daily.  Marland Kitchen aspirin EC 81 MG tablet Take 81 mg by mouth daily.   . calcium carbonate (TUMS - DOSED IN MG ELEMENTAL CALCIUM) 500 MG chewable tablet Chew 1 tablet by mouth every 4 (four) hours as needed for indigestion or heartburn.  . Cholecalciferol (HM VITAMIN D3) 4000 units CAPS Take 1 capsule by mouth daily.  . furosemide (LASIX) 40 MG tablet Take 40 mg by mouth 2 (two) times daily.   Marland Kitchen KLOR-CON 10 10 MEQ tablet Take 2 capsules by mouth daily.  Marland Kitchen liver oil-zinc oxide (DESITIN) 40 % ointment Apply 1 application topically as needed (bed sores).   . methocarbamol (ROBAXIN) 500 MG tablet Take 250 mg by mouth 4 (four) times daily.  . mirtazapine (REMERON) 15 MG tablet Take 15 mg by mouth at bedtime.  . pantoprazole (PROTONIX) 40 MG tablet Take 40 mg by mouth daily.   . polyethylene glycol (MIRALAX / GLYCOLAX) packet Take 17 g by mouth 2 (two) times daily.  . Probiotic Product (RISA-BID PROBIOTIC PO) Take 1  capsule by mouth 2 (two) times daily.  Marland Kitchen senna-docusate (SENOKOT-S) 8.6-50 MG tablet Take 1 tablet by mouth 2 (two) times daily. (Patient taking differently: Take 2 tablets by mouth 2 (two) times daily. )  . sertraline (ZOLOFT) 50 MG tablet   . spironolactone (ALDACTONE) 25 MG tablet Take 25 mg by mouth 2 (two) times daily.   . sucralfate (CARAFATE) 1 g tablet   . traMADol (ULTRAM) 50 MG tablet   . [EXPIRED] Vitamin D, Ergocalciferol, (DRISDOL) 50000 units CAPS capsule Take by mouth.  . warfarin (COUMADIN) 5 MG tablet Take 1 tablet (5 mg total) by mouth daily.    Past Medical History:  Diagnosis Date  . Arthritis    "back, hands, knees" (04/10/2016)  . Chronic atrial fibrillation ([place])    a. On Coumadin  . Chronic back pain   . 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 EF 25% by echo in 08/2015 b. RHC in 08/2015 showed normal filling pressures  . Compression fracture    "several; all in my back" (04/10/2016)  . GERD (gastroesophageal reflux disease)   . Heart murmur   . Hypertension   . S/P MVR (mitral valve replacement)    a. MVR 1994 b. redo MVR in 08/2014 - on Coumadin  . Shortness of breath dyspnea     Past Surgical History:  Procedure Laterality Date  . BREAST  LUMPECTOMY Right 2005?   benign lump excision  . CARDIAC CATHETERIZATION    . [place], 2016   "MVR; MVR"  . CATARACT EXTRACTION W/PHACO Right 02/20/2015   Procedure: CATARACT EXTRACTION PHACO AND INTRAOCULAR LENS PLACEMENT (IOC);  Surgeon: Estill Cotta, MD;  Location: [facility];  Service: Ophthalmology;  Laterality: Right;  US01:31.2 AP 25.3% CDE40.13 Fluid lot # [id number]  . ELECTROPHYSIOLOGIC STUDY N/A 10/09/2015   Procedure: CARDIOVERSION;  Surgeon: Wellington Hampshire, MD;  Location: [facility];  Service: Cardiovascular;  Laterality: N/A;  . IR GENERIC HISTORICAL  04/01/2016   IR RADIOLOGIST EVAL & MGMT 04/01/2016 [facility]  . IR  GENERIC HISTORICAL  04/15/2016   IR VERTEBROPLASTY CERV/THOR BX INC UNI/BIL INC/INJECT/IMAGING 04/15/2016 Luanne Bras, MD [facility]  . IR GENERIC HISTORICAL  04/15/2016   IR VERTEBROPLASTY EA ADDL (T&LS) BX INC UNI/BIL INC INJECT/IMAGING 04/15/2016 Luanne Bras, MD [facility]  . IR GENERIC HISTORICAL  04/15/2016   IR VERTEBROPLASTY EA ADDL (T&LS) BX INC UNI/BIL INC INJECT/IMAGING 04/15/2016 Luanne Bras, MD [facility]  . IR GENERIC HISTORICAL  05/20/2016   IR RADIOLOGIST EVAL & MGMT 05/20/2016 [facility]  . IR GENERIC HISTORICAL  06/13/2016   IR VERTEBROPLASTY EA ADDL (T&LS) BX INC UNI/BIL INC INJECT/IMAGING 06/13/2016 Luanne Bras, MD [facility]  . IR GENERIC HISTORICAL  06/13/2016   IR SACROPLASTY BILATERAL 06/13/2016 Luanne Bras, MD [facility]  . IR GENERIC HISTORICAL  06/13/2016   IR VERTEBROPLASTY LUMBAR BX INC UNI/BIL INC/INJECT/IMAGING 06/13/2016 Luanne Bras, MD [facility]  . IRRIGATION AND DEBRIDEMENT HEMATOMA Left 07/05/2015   Procedure: IRRIGATION AND DEBRIDEMENT HEMATOMA;  Surgeon: Robert Bellow, MD;  Location: [facility];  Service: General;  Laterality: Left;  . pyloric stenosis  [birth year]1937  . US ECHOCARDIOGRAPHY      Social History Social History  Substance Use Topics  . Smoking status: Former Smoker    Packs/day: 1.00    Years: 27.00    Types: Cigarettes  . Smokeless tobacco: Never Used     Comment: "quit smoking ~ 1980  . Alcohol use 0.0 oz/week     Comment: 04/10/2016 "I'll have a drink on holidays/special occasions"    Family History Family History  Problem Relation Age of Onset  . Stroke Mother   . Renal Disease Father     Allergies  Allergen Reactions  . Ace Inhibitors     Other reaction(s): Cough  . Hydrocodone Itching and Nausea Only  . Mercury     Other reaction(s): Unknown  . Nickel   . Silver Dermatitis    Severe itching  . Cephalexin Rash  . Clindamycin/Lincomycin Rash  . Penicillins Rash      Has patient had a PCN reaction causing immediate rash, facial/tongue/throat swelling, SOB or lightheadedness with hypotension: YES Has patient had a PCN reaction causing severe rash involving mucus membranes or skin necrosis: NO Has patient had a PCN reaction that required hospitalization NO Has patient had a PCN reaction occurring within the last 10 years: NO If all of the above answers are "NO", then may proceed with Cephalosporin use.     REVIEW OF SYSTEMS (Negative unless checked)  Constitutional: [] Weight loss  [] Fever  [] Chills Cardiac: [] Chest pain   [] Chest pressure   [] Palpitations   [] Shortness of breath when laying flat   [] Shortness of breath with exertion. Vascular:  [] Pain in legs with walking   [] Pain in legs at rest  [] History of DVT   [] Phlebitis   [  x]Swelling in legs   [x] Varicose veins   [] Non-healing ulcers Pulmonary:   [] Uses home oxygen   [] Productive cough   [] Hemoptysis   [] Wheeze  [] COPD   [] Asthma Neurologic:  [] Dizziness   [] Seizures   [] History of stroke   [] History of TIA  [] Aphasia   [] Vissual changes   [] Weakness or numbness in arm   [] Weakness or numbness in leg Musculoskeletal:   [x] Joint swelling   [x] Joint pain   [] Low back pain Hematologic:  [] Easy bruising  [] Easy bleeding   [] Hypercoagulable state   [] Anemic Gastrointestinal:  [] Diarrhea   [] Vomiting  [] Gastroesophageal reflux/heartburn   [] Difficulty swallowing. Genitourinary:  [] Chronic kidney disease   [] Difficult urination  [] Frequent urination   [] Blood in urine Skin:  [] Rashes   [] Ulcers  Psychological:  [] History of anxiety   []  History of major depression.  Physical Examination  Vitals:   10/21/16 1401  BP: 121/70  Pulse: 65  Resp: 16  Weight: 64 kg (141 lb)  Height: 5\' 4"  (1.626 m)   Body mass index is 24.2 kg/m. Gen: WD/WN, NAD Head: [place]/AT, No temporalis wasting.  Ear/Nose/Throat: Hearing grossly intact, nares w/o erythema or drainage Eyes: PER, EOMI, sclera nonicteric.  Neck:  Supple, no large masses.   Pulmonary:  Good air movement, no audible wheezing bilaterally, no use of accessory muscles.  Cardiac: RRR, no JVD Vascular: 2-3+ leg swelling moderate venous stasis changes Vessel Right Left  Radial Palpable Palpable  Ulnar Palpable Palpable  Brachial Palpable Palpable  Carotid Palpable Palpable  Femoral Palpable Palpable  Popliteal Palpable Palpable  PT Not Palpable Not Palpable  DP Not Palpable Not Palpable  Gastrointestinal: Non-distended. No guarding/no peritoneal signs.  Musculoskeletal: M/S 5/5 throughout.  No deformity or atrophy.  Neurologic: CN 2-12 intact. Symmetrical.  Speech is fluent. Motor exam as listed above. Psychiatric: Judgment intact, Mood & affect appropriate for pt's clinical situation. Dermatologic: + rashes bilateral ankle but no ulcers noted.  No changes consistent with cellulitis. Lymph : No lichenification or skin changes of chronic lymphedema.  CBC Lab Results  Component Value Date   WBC 3.7 07/01/2016   HGB 11.7 (L) 07/01/2016   HCT 35.5 07/01/2016   MCV 82.3 07/01/2016   PLT 220 07/01/2016    BMET    Component Value Date/Time   NA 137 07/01/2016 0450   NA 143 03/27/2012 0418   K 5.2 (H) 07/01/2016 0450   K 3.9 02/20/2015 0630   CL 101 07/01/2016 0450   CL 111 (H) 03/27/2012 0418   CO2 27 07/01/2016 0450   CO2 21 03/27/2012 0418   GLUCOSE 87 07/01/2016 0450   GLUCOSE 96 03/27/2012 0418   BUN 38 (H) 07/01/2016 0450   BUN 10 03/27/2012 0418   CREATININE 1.59 (H) 07/01/2016 0450   CREATININE 0.84 03/27/2012 0418   CALCIUM 11.0 (H) 07/01/2016 0450   CALCIUM 8.3 (L) 03/27/2012 0418   GFRNONAA 30 (L) 07/01/2016 0450   GFRNONAA >60 03/27/2012 0418   GFRAA 35 (L) 07/01/2016 0450   GFRAA >60 03/27/2012 0418   CrCl cannot be calculated (Patient's most recent lab result is older than the maximum 21 days allowed.).  COAG Lab Results  Component Value Date   INR 1.94 07/24/2016   INR 2.26 07/22/2016   INR 2.99  07/19/2016    Radiology No results found.   Assessment/Plan 1. Chronic venous insufficiency No surgery or intervention at this point in time.    I have had a long discussion with the patient regarding  venous insufficiency and why it  causes symptoms. I have discussed with the patient the chronic skin changes that accompany venous insufficiency and the long term sequela such as infection and ulceration.  Patient will begin wearing graduated compression stockings class 1 (20-30 mmHg) or compression wraps on a daily basis a prescription was given. The patient will put the stockings on first thing in the morning and removing them in the evening. The patient is instructed specifically not to sleep in the stockings.    In addition, behavioral modification including several periods of elevation of the lower extremities during the day will be continued. I have demonstrated that proper elevation is a position with the ankles at heart level.  The patient is instructed to begin routine exercise, especially walking on a daily basis  Following the review of the ultrasound the patient will follow up in 2-3 months to reassess the degree of swelling and the control that graduated compression stockings or compression wraps  is offering.   The patient can be assessed for a Lymph Pump at that time  2. Lymphedema  No surgery or intervention at this point in time.    I have reviewed my discussion with the patient regarding lymphedema and why it  causes symptoms.  Patient will continue wearing graduated compression stockings class 1 (20-30 mmHg) on a daily basis a prescription was given. The patient is reminded to put the stockings on first thing in the morning and removing them in the evening. The patient is instructed specifically not to sleep in the stockings.   In addition, behavioral modification throughout the day will be continued.  This will include frequent elevation (such as in a recliner), use of over  the counter pain medications as needed and exercise such as walking.  I have reviewed systemic causes for chronic edema such as liver, kidney and cardiac etiologies and there does not appear to be any significant changes in these organ systems over the past year.  The patient is under the impression that these organ systems are all stable and unchanged.    The patient will continue aggressive use of the  lymph pump.  This will continue to improve the edema control and prevent sequela such as ulcers and infections.   The patient will follow-up with me on an annual basis.    3. Acute congestive heart failure, unspecified heart failure type ([place]) Continue cardiac and antihypertensive medications as already ordered and reviewed, no changes at this time.  Continue statin as ordered and reviewed, no changes at this time  Nitrates PRN for chest pain    Hortencia Pilar, MD  10/27/2016 12:50 PM

## 2016-10-30 ENCOUNTER — Encounter: Payer: Medicare Other | Attending: Internal Medicine | Admitting: Internal Medicine

## 2016-10-30 DIAGNOSIS — L97221 Non-pressure chronic ulcer of left calf limited to breakdown of skin: Secondary | ICD-10-CM | POA: Insufficient documentation

## 2016-10-30 DIAGNOSIS — Z79899 Other long term (current) drug therapy: Secondary | ICD-10-CM | POA: Insufficient documentation

## 2016-10-30 DIAGNOSIS — I87333 Chronic venous hypertension (idiopathic) with ulcer and inflammation of bilateral lower extremity: Secondary | ICD-10-CM | POA: Insufficient documentation

## 2016-10-30 DIAGNOSIS — Z7901 Long term (current) use of anticoagulants: Secondary | ICD-10-CM | POA: Insufficient documentation

## 2016-10-30 DIAGNOSIS — Z952 Presence of prosthetic heart valve: Secondary | ICD-10-CM | POA: Diagnosis not present

## 2016-10-30 DIAGNOSIS — I4891 Unspecified atrial fibrillation: Secondary | ICD-10-CM | POA: Diagnosis not present

## 2016-10-30 DIAGNOSIS — I5022 Chronic systolic (congestive) heart failure: Secondary | ICD-10-CM | POA: Insufficient documentation

## 2016-10-30 DIAGNOSIS — L97812 Non-pressure chronic ulcer of other part of right lower leg with fat layer exposed: Secondary | ICD-10-CM | POA: Diagnosis not present

## 2016-10-30 DIAGNOSIS — I890 Lymphedema, not elsewhere classified: Secondary | ICD-10-CM | POA: Insufficient documentation

## 2016-11-01 NOTE — Progress Notes (Signed)
[name], [name] ([id number]) Visit Report for 10/30/2016 Arrival Information Details Patient Name: [name], [name]. Date of Service: 10/30/2016 1:30 PM Medical Record Patient Account Number: [id number] Number: Treating RN: [name] 1935-04-18 (81 y.o. Other Clinician: Date of Birth/Sex: Female) Treating [name], [place] [facility] Rae Plotner: [name] Trany Chernick/Extender: G Referring Dracen Reigle: [name] in Treatment: 49 Visit Information History Since Last Visit All ordered tests and consults were completed: No Patient Arrived: Gilford Rile Added or deleted any medications: No Arrival Time: 13:30 Any new allergies or adverse reactions: No Accompanied By: son Had a fall or experienced change in No Transfer Assistance: EasyPivot Patient activities of daily living that may affect Lift risk of falls: Patient Identification Verified: Yes Signs or symptoms of abuse/neglect since last No Secondary Verification Process Yes visito Completed: Hospitalized since last visit: No Patient Requires Transmission- No Has Dressing in Place as Prescribed: Yes Based Precautions: Has Compression in Place as Prescribed: Yes Patient Has Alerts: Yes Pain Present Now: No Patient Alerts: Patient on Blood Thinner [place]!!! Electronic Signature(s) Signed: 10/30/2016 4:37:12 PM By: [name] Entered By: [name] on 10/30/2016 13:31:45 [name], [name] ([id number]) -------------------------------------------------------------------------------- Clinic Level of Care Assessment Details Patient Name: [name]. Date of Service: 10/30/2016 1:30 PM Medical Record Patient Account Number: [id number] Number: Treating RN: [name] May 26, 1935 (81 y.o. Other Clinician: Date of Birth/Sex: Female) Treating [name], [place] Primary Care Dawsyn Zurn: [name] Anquanette Bahner/Extender: G Referring Tiffannie Sloss:  [name] in Treatment: 49 Clinic Level of Care Assessment Items TOOL 4 Quantity Score X - Use when only an EandM is performed on FOLLOW-UP visit 1 0 ASSESSMENTS - Nursing Assessment / Reassessment X - Reassessment of Co-morbidities (includes updates in patient status) 1 10 X - Reassessment of Adherence to Treatment Plan 1 5 ASSESSMENTS - Wound and Skin Assessment / Reassessment X - Simple Wound Assessment / Reassessment - one wound 1 5 []  - Complex Wound Assessment / Reassessment - multiple wounds 0 []  - Dermatologic / Skin Assessment (not related to wound area) 0 ASSESSMENTS - Focused Assessment X - Circumferential Edema Measurements - multi extremities 1 5 []  - Nutritional Assessment / Counseling / Intervention 0 []  - Lower Extremity Assessment (monofilament, tuning fork, pulses) 0 []  - Peripheral Arterial Disease Assessment (using hand held doppler) 0 ASSESSMENTS - Ostomy and/or Continence Assessment and Care []  - Incontinence Assessment and Management 0 []  - Ostomy Care Assessment and Management (repouching, etc.) 0 PROCESS - Coordination of Care X - Simple Patient / Family Education for ongoing care 1 15 []  - Complex (extensive) Patient / Family Education for ongoing care 0 []  - Staff obtains Programmer, systems, Records, Test Results / Process Orders 0 []  - Staff telephones HHA, Nursing Homes / Clarify orders / etc 0 [name], [name] ([id number]) []  - Routine Transfer to another Facility (non-emergent condition) 0 []  - Routine Hospital Admission (non-emergent condition) 0 []  - New Admissions / Biomedical engineer / Ordering NPWT, Apligraf, etc. 0 []  - Emergency Hospital Admission (emergent condition) 0 X - Simple Discharge Coordination 1 10 []  - Complex (extensive) Discharge Coordination 0 PROCESS - Special Needs []  - Pediatric / Minor Patient Management 0 []  - Isolation Patient Management 0 []  - Hearing / Language / Visual special needs 0 []  - Assessment of Community  assistance (transportation, D/C planning, etc.) 0 []  - Additional assistance / Altered mentation 0 []  - Support Surface(s) Assessment (bed, cushion, seat, etc.) 0 INTERVENTIONS - Wound Cleansing / Measurement X -  Simple Wound Cleansing - one wound 1 5 []  - Complex Wound Cleansing - multiple wounds 0 X - Wound Imaging (photographs - any number of wounds) 1 5 []  - Wound Tracing (instead of photographs) 0 []  - Simple Wound Measurement - one wound 0 []  - Complex Wound Measurement - multiple wounds 0 INTERVENTIONS - Wound Dressings []  - Small Wound Dressing one or multiple wounds 0 []  - Medium Wound Dressing one or multiple wounds 0 []  - Large Wound Dressing one or multiple wounds 0 []  - Application of Medications - topical 0 []  - Application of Medications - injection 0 [name], [name] ([id number]) INTERVENTIONS - Miscellaneous []  - External ear exam 0 []  - Specimen Collection (cultures, biopsies, blood, body fluids, etc.) 0 []  - Specimen(s) / Culture(s) sent or taken to Lab for analysis 0 []  - Patient Transfer (multiple staff / Civil Service fast streamer / Similar devices) 0 []  - Simple Staple / Suture removal (25 or less) 0 []  - Complex Staple / Suture removal (26 or more) 0 []  - Hypo / Hyperglycemic Management (close monitor of Blood Glucose) 0 []  - Ankle / Brachial Index (ABI) - do not check if billed separately 0 X - Vital Signs 1 5 Has the patient been seen at the hospital within the last three years: Yes Total Score: 65 Level Of Care: New/Established - Level 2 Electronic Signature(s) Signed: 10/30/2016 4:37:12 PM By: [name] Entered By: [name] on 10/30/2016 14:56:51 [name], [name] ([id number]) -------------------------------------------------------------------------------- Encounter Discharge Information Details Patient Name: Carrie Mulligan C. Date of Service: 10/30/2016 1:30 PM Medical Record Patient Account Number: [id number] Number: Treating RN:  [name] 1934/11/16 (81 y.o. Other Clinician: Date of Birth/Sex: Female) Treating [name], [name] Primary Care Jesicca Dipierro: [name] Donalee Gaumond/Extender: G Referring Jamieson Lisa: [name] in Treatment: 49 Encounter Discharge Information Items Discharge Pain Level: 0 Discharge Condition: Stable Ambulatory Status: Walker Discharge Destination: Home Transportation: Private Auto Accompanied By: son Schedule Follow-up Appointment: No Medication Reconciliation completed No and provided to Patient/Care Alveria Mcglaughlin: Provided on Clinical Summary of Care: 10/30/2016 Form Type Recipient Paper Patient MS Electronic Signature(s) Signed: 10/30/2016 2:17:25 PM By: Ruthine Dose Entered By: Ruthine Dose on 10/30/2016 14:17:24 Stepney, Shelitha Loletha [name] ([id number]) -------------------------------------------------------------------------------- Lower Extremity Assessment Details Patient Name: [name], [name] Date of Service: 10/30/2016 1:30 PM Medical Record Patient Account Number: [id number] Number: Treating RN: [name] 1934/11/20 (81 y.o. Other Clinician: Date of Birth/Sex: Female) Treating [name], [place] Primary Care Shavonte Zhao: [name] Satoria Dunlop/Extender: G Referring Maycee Blasco: [name] in Treatment: 49 Edema Assessment Assessed: [Left: No] [Right: No] E[Left: dema] [Right: :] Calf Left: Right: Point of Measurement: 33 cm From Medial Instep 30.2 cm cm Ankle Left: Right: Point of Measurement: 10 cm From Medial Instep 21.2 cm cm Vascular Assessment Pulses: Dorsalis Pedis Palpable: [Left:Yes] Posterior Tibial Extremity colors, hair growth, and conditions: Extremity Color: [Left:Hyperpigmented] Temperature of Extremity: [Left:Warm] Capillary Refill: [Left:< 3 seconds] Electronic Signature(s) Signed: 10/30/2016 4:37:12 PM By: [name] Entered By: [name] on 10/30/2016 13:42:14 [name], [name]  ([id number]) -------------------------------------------------------------------------------- Multi Wound Chart Details Patient Name: Carrie Mulligan C. Date of Service: 10/30/2016 1:30 PM Medical Record Patient Account Number: [id number] Number: Treating RN: [name] [name] 13, 1935 (81 y.o. Other Clinician: Date of Birth/Sex: Female) Treating [name], [name] Primary Care Zachrey Deutscher: [name] Kehaulani Fruin/Extender: G Referring Carter Kassel: [name] in Treatment: 49 Vital Signs Height(in): 68 Pulse(bpm): 61 Weight(lbs): 178 Blood Pressure 123/50 (mmHg): Body Mass Index(BMI): 27 Temperature(F): 97.5 Respiratory Rate 16 (breaths/min): Photos: [9:No  Photos] [N/A:N/A] Wound Location: [9:Left Lower Leg - Posterior] [N/A:N/A] Wounding Event: [9:Gradually Appeared] [N/A:N/A] Primary Etiology: [9:Venous Leg Ulcer] [N/A:N/A] Comorbid History: [9:Cataracts, Arrhythmia, Hypotension, Osteoarthritis] [N/A:N/A] Date Acquired: [9:06/26/2016] [N/A:N/A] Weeks of Treatment: [9:18] [N/A:N/A] Wound Status: [9:Open] [N/A:N/A] Measurements L x W x D 0x0x0 [N/A:N/A] (cm) Area (cm) : [9:0] [N/A:N/A] Volume (cm) : [9:0] [N/A:N/A] % Reduction in Area: [9:100.00%] [N/A:N/A] % Reduction in Volume: 100.00% [N/A:N/A] Classification: [9:Full Thickness Without Exposed Support Structures] [N/A:N/A] Exudate Amount: [9:None Present] [N/A:N/A] Wound Margin: [9:Flat and Intact] [N/A:N/A] Granulation Amount: [9:None Present (0%)] [N/A:N/A] Necrotic Amount: [9:None Present (0%)] [N/A:N/A] Exposed Structures: [9:Fascia: No Fat Layer (Subcutaneous Tissue) Exposed: No Tendon: No Muscle: No] [N/A:N/A] Joint: No Bone: No Limited to Skin Breakdown Epithelialization: Large (67-100%) N/A N/A Periwound Skin Texture: No Abnormalities Noted N/A N/A Periwound Skin No Abnormalities Noted N/A N/A Moisture: Periwound Skin Color: Ecchymosis: Yes N/A N/A Temperature: No Abnormality N/A  N/A Tenderness on Yes N/A N/A Palpation: Wound Preparation: Ulcer Cleansing: N/A N/A Rinsed/Irrigated with Saline Topical Anesthetic Applied: None Treatment Notes Electronic Signature(s) Signed: 10/30/2016 5:52:53 PM By: [name] MD Entered By: [name] on 10/30/2016 16:00:22 [name], [name] ([id number]) -------------------------------------------------------------------------------- Multi-Disciplinary Care Plan Details Patient Name: [name]. Date of Service: 10/30/2016 1:30 PM Medical Record Patient Account Number: [id number] Number: Treating RN: [name] 1935/01/02 (81 y.o. Other Clinician: Date of Birth/Sex: Female) Treating [name], [name] Primary Care Obryan Radu: [name] Britania Shreeve/Extender: G Referring Elfego Giammarino: [name] in Treatment: 66 Active Inactive Electronic Signature(s) Signed: 10/30/2016 4:37:12 PM By: [name] Entered By: [name] on 10/30/2016 13:42:42 [name], [name] ([id number]) -------------------------------------------------------------------------------- Pain Assessment Details Patient Name: Carrie Mulligan C. Date of Service: 10/30/2016 1:30 PM Medical Record Patient Account Number: [id number] Number: Treating RN: [name] Aug 10, 1934 (81 y.o. Other Clinician: Date of Birth/Sex: Female) Treating [name], [name] Primary Care Marquerite Forsman: [name] Jessikah Dicker/Extender: G Referring Kennard Fildes: [name] in Treatment: 49 Active Problems Location of Pain Severity and Description of Pain Patient Has Paino No Site Locations With Dressing Change: No Pain Management and Medication Current Pain Management: Electronic Signature(s) Signed: 10/30/2016 4:37:12 PM By: [name] Entered By: [name] on 10/30/2016 13:31:52 [name], [name]  ([id number]) -------------------------------------------------------------------------------- Patient/Caregiver Education Details Patient Name: [name]. Date of Service: 10/30/2016 1:30 PM Medical Record Patient Account Number: [id number] Number: Treating RN: [name] 1934-09-11 (81 y.o. Other Clinician: Date of Birth/Gender: Female) Treating [name], [name] Primary Care Physician/Extender: Claudette Laws Physician: Suella [place] in Treatment: 49 Referring Physician: [name] Education Assessment Education Provided To: Patient Education Topics Provided Wound/Skin Impairment: Handouts: Other: Please call or contact or office if you have any questions or concerns. Methods: Explain/Verbal Responses: State content correctly Electronic Signature(s) Signed: 10/30/2016 4:37:12 PM By: [name] Entered By: [name] on 10/30/2016 13:44:31 [name], [place]. ([id number]) -------------------------------------------------------------------------------- Wound Assessment Details Patient Name: [name], Mala C. Date of Service: 10/30/2016 1:30 PM Medical Record Patient Account Number: [id number] Number: Treating RN: [name] Nov 15, 1934 (81 y.o. Other Clinician: Date of Birth/Sex: Female) Treating [name], [place] Primary Care Maxey Ransom: [name] Nalah Macioce/Extender: G Referring Oneka Parada: [name] in Treatment: 49 Wound Status Wound Number: 9 Primary Venous Leg Ulcer Etiology: Wound Location: Left Lower Leg - Posterior Wound Status: Open Wounding Event: Gradually Appeared Comorbid Cataracts, Arrhythmia, Hypotension, Date Acquired: 06/26/2016 History: Osteoarthritis Weeks Of Treatment: 18 Clustered Wound: No Wound Measurements Length: (cm) 0 % Reduction Width: (cm) 0 % Reduction Depth: (cm) 0 Epithelializ Area: (cm) 0  Tunneling: Volume: (cm) 0 Undermining in Area: 100% in Volume: 100% ation: Large  (67-100%) No : No Wound Description Full Thickness Without Exposed Classification: Support Structures Wound Margin: Flat and Intact Exudate None Present Amount: Foul Odor After Cleansing: No Slough/Fibrino No Wound Bed Granulation Amount: None Present (0%) Exposed Structure Necrotic Amount: None Present (0%) Fascia Exposed: No Fat Layer (Subcutaneous Tissue) Exposed: No Tendon Exposed: No Muscle Exposed: No Joint Exposed: No Bone Exposed: No Limited to Skin Breakdown Periwound Skin Texture Texture Color No Abnormalities Noted: No No Abnormalities Noted: No Ecchymosis: Yes Moisture No Abnormalities Noted: No Temperature / Pain Teel, Abbigaile C. ([id number]) Temperature: No Abnormality Tenderness on Palpation: Yes Wound Preparation Ulcer Cleansing: Rinsed/Irrigated with Saline Topical Anesthetic Applied: None Electronic Signature(s) Signed: 10/30/2016 4:37:12 PM By: [name] Entered By: [name] on 10/30/2016 13:40:27 [place], Desarie Loletha [name] ([id number]) -------------------------------------------------------------------------------- Vitals Details Patient Name: Carrie Mulligan C. Date of Service: 10/30/2016 1:30 PM Medical Record Patient Account Number: [id number] Number: Treating RN: [name] [birth year] (81 y.o. Other Clinician: Date of Birth/Sex: Female) Treating [name], [name] Primary Care Leina Babe: [name] Ritta Hammes/Extender: G Referring Naileah Karg: [name] in Treatment: 49 Vital Signs Time Taken: 13:31 Temperature (F): 97.5 Height (in): 68 Pulse (bpm): 61 Weight (lbs): 178 Respiratory Rate (breaths/min): 16 Body Mass Index (BMI): 27.1 Blood Pressure (mmHg): 123/50 Reference Range: 80 - 120 mg / dl Electronic Signature(s) Signed: 10/30/2016 4:37:12 PM By: [name] Entered By: [name] on 10/30/2016 13:33:53

## 2016-11-01 NOTE — Progress Notes (Signed)
JO, BOOZE ([id number]) Visit Report for 10/30/2016 Chief Complaint Document Details Patient Name: [name], [name]. Date of Service: 10/30/2016 1:30 PM Medical Record Patient Account Number: [id number] Number: Treating RN: [name] 11-Mar-1935 (81 y.o. Other Clinician: Date of Birth/Sex: Female) Treating Shawnia Vizcarrondo Primary Care Provider: [name] Provider/Extender: G Referring Provider: [name] in Treatment: 49 Information Obtained from: Patient Chief Complaint Patient here for reevaluation of her right lower externally ulcer Electronic Signature(s) Signed: 10/30/2016 5:52:53 PM By: [name] MD Entered By: [name] on 10/30/2016 16:00:32 [name], [name] ([id number]) -------------------------------------------------------------------------------- HPI Details Patient Name: [name]. Date of Service: 10/30/2016 1:30 PM Medical Record Patient Account Number: [id number] Number: Treating RN: [name] December 26, 1934 (81 y.o. Other Clinician: Date of Birth/Sex: Female) Treating Margene Cherian Primary Care Provider: [name] Provider/Extender: G Referring Provider: [name] in Treatment: 49 History of Present Illness HPI Description: 11/22/15; this is Cuttino is a 82 year old woman who lives at home on her own. According to the patient and her daughter was present she has had long-standing edema in her legs dating back many years. She also has a history of chronic systolic heart failure, atrial fibrillation and is status post mitral valve replacement. Last echocardiogram I see in [facility] showed an ejection fraction of 40-45% she is on Lasix 60 mg a day and spironolactone 25 mg a day. Her current problem began in December around Christmas time she developed a small hematoma in the medial part of her left leg which rapidly expanded to a very large hematoma that required surgical  debridement. This situation was complicated by the fact that the patient is on long-standing Coumadin for mechanical heart valve. She went to the OR had this evacuated on January 4 /17. The wound has gradually improved however she has developed a small wounds around this area and more recently a wound on the right lateral leg. She is weeping edema fluid. The patient is already been to see vascular surgery. It was recommended that she wear Unna boots, she did not tolerate this due to pain in the left leg. She was then prescribed Juzo stockings and really can't get these on herself although truthfully there is probably too much edema for a Juzo stockings currently. She is not a diabetic and has no history of PAD or claudication that I could elicit. She does not use the external compression pumps reliably. She comes today with notes from her primary physician and Dr. Ronalee Belts both recommending various forms of compression but the patient does not really complied with them. Has been using the external compression pumps with some regularity but certainly not daily on the right leg and this has helped. I also note that her daughter tells me the history that she does not sleep in bed at home. She has a hospital bed but with her legs up she finds this painful so she sleeps in the couch sitting up with her legs dependent. 11/29/15; the patient is arrives today accompanied by her son. He expresses satisfaction that she is maintained the compression all week. 12/06/15; the patient has kept her Profore light wraps on, we have good edema control no major change in the wounds we have been using Aquacel 12/13/15; changed to RTD last week. One of the 3 wounds on her left medial leg is healed she has 2 remaining wounds here and one on the right lateral leg. 12/18/2015 -- the patient was at Dr. Nino Parsley office today and he  was seeing her for an arterial study. While the wrap was being removed she had an inadvertent  laceration of the left proximal anterior leg which bled quite profusely and a compression dressing was applied over this and the patient was here to get her Profore wraps done. I was asked to see the patient to make an assessment and treat appropriately. 12/26/15; the patient's injury on the left proximal leg and Steri-Strips removed after soaking. There is an open area here. The original wounds to still open on the right lateral and left medial leg. 01/03/16 patient's injury on her proximal left leg looks quite good. Still small open area on the medial left leg which appears to be improving. The area on the right lateral leg still substantially open with no real improvement in wound depth. Her edema control is marginal with a Profore light. We have been using RTD for 3-4 weeks without any major change 01/10/16: wounds without s/s of infection. vascular results are pending regarding arterial studies. [name], [name] ([id number]) 01/17/16; patient comes in today complaining of severe pain however I think most of this is in the right hip not related to her wounds. She continues with a oval-shaped wound on the right lateral leg, trauma to the left anterior leg just below her tibial plateau. She has a smaller eschar on the left anterior leg. She is being using Prisma however she informs [place] today that she is actually allergic to silver, nose this from a previous application at [facility] some years ago 01/24/16; edema is not so well controlled today, I think I reduced her to [place] bilaterally last week. The area which was a scissor injury on her left upper anterior lower leg is fully epithelialized. She only has a small open area remaining on the medial aspect of her left leg. The oval-shaped wound on the right mid lateral leg may be a bit smaller. Debrided of surface slough nonviable subcutaneous tissue. I had changed to collagen 2 weeks ago in an attempt to get this to close 01/31/16 all the  patient's wounds on her left leg give healed. We have good edema control with bilateral Profore lites which she has been compliant with. She still has the oval-shaped wound on the right lateral calf allergic even this appears to be slowly improving. The patient has juxtalite stockings at home. She states she thinks she can put these on. She also has external compression pumps at home although I think her compliance with this has not been good in the past. She complains today of edema in her thighs. Tells me she takes 40 mg of Lasix daily 02/21/16; only 1 small wound remains on the lateral aspect of the right calf. She is using Juzo stockings on the left leg although she complains about difficulty in applying them. She has external compression pumps at home 02/28/16; the small open wound on her right lateral calf is improved in terms of wound area. It appears that she has a wrap injury on the anterior aspect of the upper leg 03/06/16; the small open wound on her right lateral calf has a very small open area remaining. The wrap injury on the anterior aspect of the upper leg also appears better. She arrives today in clinic with a history of dyspnea with minimal exertion starting with the last 2-4 days. She does not describe chest pain. Her son and our intake nurse think she has facial swelling. She has a history of an artificial mitral valve on Coumadin  03/13/16; the small wound on the right lateral calf is no better this week. The superior wrap injury anteriorly requires debridement of surface eschar and nonviable subcutaneous tissue. She has been to her primary doctor with regards to her dyspnea we identified last week. Per the patient's description her Lasix has been increased 03/21/2016 -- patient of Dr. Dellia Nims who could not keep her appointment yesterday but has come in today with the right lower leg looking good and this is the leg which has been treated in the recent past. However, her left  lower extremity is extremely swollen, tender and edematous with redness and discoloration. 03/27/16; there is only 2 small areas remain on the right leg. The edema that was so concerning last week has come down however there is extensive bruising on the lateral left leg medial left foot suggesting that she lost some blood into the leg itself. I checked her hemoglobin on 9/21 was 14.5. Her INR was over 6. Duplex ultrasound was negative for DVT 04/03/16 at this point in time today patient is actually doing substantially better in regard to the wound on the anterior right lower extremity. currently there is no slough or eschar noted and no evidence of erythema, discharge, or local infection. She is exhibiting no signs of systemic infection. She is tolerating the dressing changes as well as the wrapping at this point in time. 04/24/16; I have not seen Mrs. Storti for 3 weeks. Apparently she was admitted to hospital for respiratory issues/also apparently has had multiple compression fractures and had kyphoplasty. When she was last here she only had one small open area remaining on the left leg she was using juxtalite stockings. Her son is upset about restarting the Coumadin which she blames or the swelling in her legs. She is on Coumadin for prophylaxis with a chronic artificial heart valve. 04/30/16 at this point in time patient has been tolerating the 3 layer compression wrap as well as the calcium alginate. 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 With tthat being said she does have the Kemmer, Clawson. ([id number]) continued wound of the left lower extremity as well as the right medial lower extremity. The right side is significantly smaller compared to the left but actually is slough covered. fortunately she has no significant tenderness at rest although with manipulation of the right location of the wound this is significantly tender. 05/07/16 for follow-up evaluation today both the  patient's wounds appear to be significantly improved in size compared to last week. She is also having less pain at both locations currently. Her pain level at most is related to be a 1-2 out of 10. She does have some discharge but fortunately no evidence of infection at this point.she also continues to tolerate the compression wraps well. 05/14/16; the patient's wound on the left leg actually looks as though it's on its way to closure. She is using collagen to this area. She has a small punched out area over the right medial calf. The cause of this is not really clear it has probably 0.4 cm of depth. She has an IV in her right hand which she states is for IV fluid when she develops low sodiums her potassiums, the etiology of this is not clear 05-21-16 she presents today with continued ulcerations the right lower extremity, posterior aspect. she has been using Iodoflex and compression therapy to the area. She denies any new injuries or trauma. She does have 2 areas proximal to this ulceration of dry crust  along with an area to her left posterior lower leg that is purple discolored area appears similar to how her current ulceration started, she denies any trauma or injury to the left posterior leg we will monitor all of these areas. 05/28/16; the patient is down to 1 small open area on the right medial mid calf. This is however larger and in 50% of the surface area deeper approximately 0.4 cm. The reason for this deterioration is unclear. I've gone ahead and done a culture of this area she also tells [place] today that she is short of breath. She is also noted a swelling on her lower left eyelid 06/05/16; the patient is 1 small open area on the right medial mid calf. This looks about the same as last week. The deep area, medial 50% of the wound looks about the same. CandS I did last week was negative. The entire area looks about the same 06/12/16; not much change in this over the course of the last  week. Patient comes in today complaining of extreme back pain. Apparently she has either a kyphoplasty or vertebral plasty scheduled at [facility] radiology tomorrow. For this reason no debridement today. We have been using Iodoflex 06/19/16; patient arrived with the surface eschar from last week removed with a curet debrided of subcutaneous tissue. She tolerates this reasonably well. She is still in a skilled facility. States her vertebral plasty last week is made her pain bearable 06/26/16; patient arrived with a new wound on the posterior aspect of her left calf. She notices 3 or 4 days ago but is not certain how this happened. She states she simply felt a stinging sensation and one day on her foot and then the next day on the back of her calf. 06/28/2016 -- Ms. Reubin Milan called urgently saying that the pain in her left foot was drastically worse over the last several hours and though she removed her compression wrap she wanted to be seen before the long holiday. 07/03/16; she came in to see Dr. Con Memos on 12/29 for pain in her left foot/heel. Nothing much was found at the time and she states that the pain is better but involved her whole foot. She has a wound on the lateral aspect of the left calf and the medial aspect of the right. The medial 1 looks as if it is just about healed however the left one still has considerable edema. 07/09/16; she arrives in clinic today with a dime-sized wound on the left posterior calf and a small linear area on the right medial calf. The area on the left was debrided with a #3 curet nonviable subcutaneous tissue, we are not making progress here. The area on the right was also debrided of surface eschar and nonviable subcutaneous tissue this has some depth to it. 07/16/16; she has a dime size wound on the left posterior calf that has some depth. This required debridement last week, although I did not feel the need to do this today. We used Iodoflex last week.  Small area on the right also was not debrided. This looks like it may be close to closing 07/23/16; using Iodoflex to both wound areas. The area on the right posterior/medial calf is just about closed. She still has a fairly substantial wound on the left posterior leg which is still problematic. 07/30/16; the area on the posterior right leg is closed. The patient will graduate to her own pressure stocking. She has a punched-out area on the left mid calf.  The surface is better than last week. I did not attempt Ceballos, Lively C. ([id number]) debridement. Continuing Iodoflex Is complaining about pain in the right lateral ribs this is not pleuritic. She apparently had a chest x-ray done at home the immobile X x-ray. I don't have access to that report. She is not complaining of inspiratory chest pain cough etc. 08/07/16; the posterior right leg wound is closed and she is wearing a compression stocking her son expresses satisfaction with her compliance with the stocking. He continues to have a fairly substantial punched-out circular area on the left posterior calf. This wound only came about in late December, she is not really sure how it happened. 08/13/16; no major change; small punched out wound on the left posterior calf. I have reviewed vascular hx. Followed by Dr. Ronalee Belts, not felt to have PAD 08/21/16; the area on her left posterior calf still a small punched out wound. Debrided this with a #3 curet of surface necrotic material however the wound continues to clean up quite nicely and I think the dimensions overall her smaller. We have been using Promogran. UNFORTUNATELY the patient arrives today with a fairly substantial skin tear on the right anterior lateral leg that was sustained when an aide was helping her put on her compression stockings. This happened 3 days ago. We will continue Promogran to all wound areas now including the right leg again. She is asked to keep the wraps that we put on in  place and we will discharge home health today at her request 08/28/16; the area on her left posterior calf is small punched out but appears to have some improvement still required debridement. The large skin tear on the right lateral leg looks healthy and is smaller no debridement is required. There is no evidence of surrounding cellulitis. Using Promogran on both areas 09/04/16; the area on the left posterior calf continues to gradually improve however the large more recent skin tear on her right lateral leg arrived with a large necrotic surface. We have been using Promogran and that was used to avoid adherence to the right leg wound. 3/1 14/18; the area on the left posterior calf continues to contract. Skin tear on her right lateral leg also looks a better this week. We've been using Promogran [allergic to Silver] 09/18/16; slight change in the left posterior calf. Skin tear on her right leg is down about 50%. We've been using Promogran 09/25/16; improvement in the right posterior laceration wound. This looks like it is progressing towards closure. We are using Promogran here. No change in the small punched out wound on the posterior left calf. Also using Promogran under bilateral compression 10/03/2016 -- the patient normally sees Dr. Dellia Nims and he is here today because of logistic reasons. After reviewing her chart I note that her arterial studies were acceptable as per Dr. Nino Parsley office and he had recommended a 4-layer compression which the patient has not been tolerating very well. She has an allergy to silver and hence we are using plain collagen. She has lymphedema pumps which she is not using as advised and is being rather noncompliant. her son accompanies her today and I have had a detailed discussion about the proper way she should be using her lymphedema pumps and compliance with a 3 or 4 layer compression wrap. 10/09/16; the traumatic wound on the right lateral leg is just about  closed we have been using Promogran. The area on the left posterior calf had gotten smaller but is stalled  in the last 3 weeks also using Promogran. Her son states she is using the compression pumps more religiously on top of her compression wraps 10/16/16; there traumatic wound on the right lateral leg is closed. The area on the left posterior leg we started on polymen AG last week is smaller and has improved. 10/23/16/ Using polymen. doing well only small open area remains. 10/30/16; The area on the posterior left calf is fully epithelialized and resolved. 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 XARIA, JUDON ([id number]) Electronic Signature(s) Signed: 10/30/2016 5:52:53 PM By: [name] MD Entered By: [name] on 10/30/2016 16:03:54 [name], [name] ([id number]) -------------------------------------------------------------------------------- Physical Exam Details Patient Name: JAZMYN, OFFNER C. Date of Service: 10/30/2016 1:30 PM Medical Record Patient Account Number: [id number] Number: Treating RN: [name] 04/17/1935 (81 y.o. Other Clinician: Date of Birth/Sex: Female) Treating Lorali Khamis Primary Care Provider: [name] Provider/Extender: G Referring Provider: [name] in Treatment: 49 Constitutional Sitting or standing Blood Pressure is within target range for patient.. Pulse regular and within target range for patient.Marland Kitchen Respirations regular, non-labored and within target range.. Temperature is normal and within the target range for the patient.. Notes wound exam: both legs are controlled in terms of edema. she has pumps at home Electronic Signature(s) Signed: 10/30/2016 5:52:53 PM By: [name] MD Entered By: [name] on 10/30/2016 16:04:48 [name], [name]  ([id number]) -------------------------------------------------------------------------------- Physician Orders Details Patient Name: [name]. Date of Service: 10/30/2016 1:30 PM Medical Record Patient Account Number: [id number] Number: Treating RN: [name] Nov 13, 1934 (81 y.o. Other Clinician: Date of Birth/Sex: Female) Treating Relda Agosto Primary Care Provider: [name] Provider/Extender: G Referring Provider: [name] in Treatment: 49 Verbal / Phone Orders: Yes Clinician: Carolyne Fiscal, Debi Read Back and Verified: Yes Diagnosis Coding Discharge From [facility] o Discharge from [place] your Ju[employer] everyday and take off at bedtime. Please call or contact or office if you have any questions or concerns. Electronic Signature(s) Signed: 10/30/2016 4:37:12 PM By: [name] Signed: 10/30/2016 5:52:53 PM By: [name] MD Entered By: [name] on 10/30/2016 13:43:51 [place], [name] ([id number]) -------------------------------------------------------------------------------- Problem List Details Patient Name: CAITLYNN, JU C. Date of Service: 10/30/2016 1:30 PM Medical Record Patient Account Number: [id number] Number: Treating RN: [name] 09-[name]-1935 (81 y.o. Other Clinician: Date of Birth/Sex: Female) Treating Desiraye Rolfson Primary Care Provider: [name] Provider/Extender: G Referring Provider: [name] in Treatment: 49 Active Problems ICD-10 Encounter Code Description Active Date Diagnosis L97.812 Non-pressure chronic ulcer of other part of right lower leg 04/30/2016 Yes with fat layer exposed I87.333 Chronic venous hypertension (idiopathic) with ulcer and 04/03/2016 Yes inflammation of bilateral lower extremity I89.0 Lymphedema, not elsewhere classified 04/03/2016 Yes P37.90 Chronic systolic (congestive) heart failure 04/03/2016 Yes L97.221  Non-pressure chronic ulcer of left calf limited to 07/03/2016 Yes breakdown of skin Inactive Problems Resolved Problems ICD-10 Code Description Active Date Resolved Date S81.812S Laceration without foreign body, left lower leg, sequela 04/03/2016 04/03/2016 Electronic Signature(s) Signed: 10/30/2016 5:52:53 PM By: [name] MD Entered By: [name] on 10/30/2016 16:00:08 [name], [name] ([id number]) 61, Garvin ([id number]) -------------------------------------------------------------------------------- Progress Note Details Patient Name: Carrie Mulligan C. Date of Service: 10/30/2016 1:30 PM Medical Record Patient Account Number: [id number] Number: Treating RN: [name] 1934/10/17 (81 y.o. Other Clinician: Date of Birth/Sex: Female) Treating Espyn Radwan Primary Care Provider: [name] Provider/Extender: Darnell Level  Referring Provider: [name] in Treatment: 49 Subjective Chief Complaint Information obtained from Patient Patient here for reevaluation of her right lower externally ulcer History of Present Illness (HPI) 11/22/15; this is Glaza is a 82 year old woman who lives at home on her own. According to the patient and her daughter was present she has had long-standing edema in her legs dating back many years. She also has a history of chronic systolic heart failure, atrial fibrillation and is status post mitral valve replacement. Last echocardiogram I see in [facility] showed an ejection fraction of 40-45% she is on Lasix 60 mg a day and spironolactone 25 mg a day. Her current problem began in December around Christmas time she developed a small hematoma in the medial part of her left leg which rapidly expanded to a very large hematoma that required surgical debridement. This situation was complicated by the fact that the patient is on long-standing Coumadin for mechanical heart valve. She went to the OR had this evacuated on  January 4 /17. The wound has gradually improved however she has developed a small wounds around this area and more recently a wound on the right lateral leg. She is weeping edema fluid. The patient is already been to see vascular surgery. It was recommended that she wear Unna boots, she did not tolerate this due to pain in the left leg. She was then prescribed Juzo stockings and really can't get these on herself although truthfully there is probably too much edema for a Juzo stockings currently. She is not a diabetic and has no history of PAD or claudication that I could elicit. She does not use the external compression pumps reliably. She comes today with notes from her primary physician and Dr. Ronalee Belts both recommending various forms of compression but the patient does not really complied with them. Has been using the external compression pumps with some regularity but certainly not daily on the right leg and this has helped. I also note that her daughter tells me the history that she does not sleep in bed at home. She has a hospital bed but with her legs up she finds this painful so she sleeps in the couch sitting up with her legs dependent. 11/29/15; the patient is arrives today accompanied by her son. He expresses satisfaction that she is maintained the compression all week. 12/06/15; the patient has kept her Profore light wraps on, we have good edema control no major change in the wounds we have been using Aquacel 12/13/15; changed to RTD last week. One of the 3 wounds on her left medial leg is healed she has 2 remaining wounds here and one on the right lateral leg. 12/18/2015 -- the patient was at Dr. Nino Parsley office today and he was seeing her for an arterial study. While the wrap was being removed she had an inadvertent laceration of the left proximal anterior leg which bled quite profusely and a compression dressing was applied over this and the patient was here to get her Profore wraps  done. I was asked to see the patient to make an assessment and treat appropriately. 12/26/15; the patient's injury on the left proximal leg and Steri-Strips removed after soaking. There is an open area here. The original wounds to still open on the right lateral and left medial leg. [name], [name] ([id number]) 01/03/16 patient's injury on her proximal left leg looks quite good. Still small open area on the medial left leg which appears to be improving. The area on  the right lateral leg still substantially open with no real improvement in wound depth. Her edema control is marginal with a Profore light. We have been using RTD for 3-4 weeks without any major change 01/10/16: wounds without s/s of infection. vascular results are pending regarding arterial studies. 01/17/16; patient comes in today complaining of severe pain however I think most of this is in the right hip not related to her wounds. She continues with a oval-shaped wound on the right lateral leg, trauma to the left anterior leg just below her tibial plateau. She has a smaller eschar on the left anterior leg. She is being using Prisma however she informs [place] today that she is actually allergic to silver, nose this from a previous application at [facility] some years ago 01/24/16; edema is not so well controlled today, I think I reduced her to [place] bilaterally last week. The area which was a scissor injury on her left upper anterior lower leg is fully epithelialized. She only has a small open area remaining on the medial aspect of her left leg. The oval-shaped wound on the right mid lateral leg may be a bit smaller. Debrided of surface slough nonviable subcutaneous tissue. I had changed to collagen 2 weeks ago in an attempt to get this to close 01/31/16 all the patient's wounds on her left leg give healed. We have good edema control with bilateral Profore lites which she has been compliant with. She still has the oval-shaped wound on the  right lateral calf allergic even this appears to be slowly improving. The patient has juxtalite stockings at home. She states she thinks she can put these on. She also has external compression pumps at home although I think her compliance with this has not been good in the past. She complains today of edema in her thighs. Tells me she takes 40 mg of Lasix daily 02/21/16; only 1 small wound remains on the lateral aspect of the right calf. She is using Juzo stockings on the left leg although she complains about difficulty in applying them. She has external compression pumps at home 02/28/16; the small open wound on her right lateral calf is improved in terms of wound area. It appears that she has a wrap injury on the anterior aspect of the upper leg 03/06/16; the small open wound on her right lateral calf has a very small open area remaining. The wrap injury on the anterior aspect of the upper leg also appears better. She arrives today in clinic with a history of dyspnea with minimal exertion starting with the last 2-4 days. She does not describe chest pain. Her son and our intake nurse think she has facial swelling. She has a history of an artificial mitral valve on Coumadin 03/13/16; the small wound on the right lateral calf is no better this week. The superior wrap injury anteriorly requires debridement of surface eschar and nonviable subcutaneous tissue. She has been to her primary doctor with regards to her dyspnea we identified last week. Per the patient's description her Lasix has been increased 03/21/2016 -- patient of Dr. Dellia Nims who could not keep her appointment yesterday but has come in today with the right lower leg looking good and this is the leg which has been treated in the recent past. However, her left lower extremity is extremely swollen, tender and edematous with redness and discoloration. 03/27/16; there is only 2 small areas remain on the right leg. The edema that was so  concerning last week has  come down however there is extensive bruising on the lateral left leg medial left foot suggesting that she lost some blood into the leg itself. I checked her hemoglobin on 9/21 was 14.5. Her INR was over 6. Duplex ultrasound was negative for DVT 04/03/16 at this point in time today patient is actually doing substantially better in regard to the wound on the anterior right lower extremity. currently there is no slough or eschar noted and no evidence of erythema, discharge, or local infection. She is exhibiting no signs of systemic infection. She is tolerating the dressing changes as well as the wrapping at this point in time. 04/24/16; I have not seen Mrs. Torelli for 3 weeks. Apparently she was admitted to hospital for respiratory issues/also apparently has had multiple compression fractures and had kyphoplasty. When she was last here she only had one small open area remaining on the left leg she was using juxtalite stockings. Her son [name], [name] ([id number]) is upset about restarting the Coumadin which she blames or the swelling in her legs. She is on Coumadin for prophylaxis with a chronic artificial heart valve. 04/30/16 at this point in time patient has been tolerating the 3 layer compression wrap as well as the calcium alginate. 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 With tthat being said she does have the continued wound of the left lower extremity as well as the right medial lower extremity. The right side is significantly smaller compared to the left but actually is slough covered. fortunately she has no significant tenderness at rest although with manipulation of the right location of the wound this is significantly tender. 05/07/16 for follow-up evaluation today both the patient's wounds appear to be significantly improved in size compared to last week. She is also having less pain at both locations currently. Her pain level at most is related to  be a 1-2 out of 10. She does have some discharge but fortunately no evidence of infection at this point.she also continues to tolerate the compression wraps well. 05/14/16; the patient's wound on the left leg actually looks as though it's on its way to closure. She is using collagen to this area. She has a small punched out area over the right medial calf. The cause of this is not really clear it has probably 0.4 cm of depth. She has an IV in her right hand which she states is for IV fluid when she develops low sodiums her potassiums, the etiology of this is not clear 05-21-16 she presents today with continued ulcerations the right lower extremity, posterior aspect. she has been using Iodoflex and compression therapy to the area. She denies any new injuries or trauma. She does have 2 areas proximal to this ulceration of dry crust along with an area to her left posterior lower leg that is purple discolored area appears similar to how her current ulceration started, she denies any trauma or injury to the left posterior leg we will monitor all of these areas. 05/28/16; the patient is down to 1 small open area on the right medial mid calf. This is however larger and in 50% of the surface area deeper approximately 0.4 cm. The reason for this deterioration is unclear. I've gone ahead and done a culture of this area she also tells [place] today that she is short of breath. She is also noted a swelling on her lower left eyelid 06/05/16; the patient is 1 small open area on the right medial mid calf. This looks about  the same as last week. The deep area, medial 50% of the wound looks about the same. CandS I did last week was negative. The entire area looks about the same 06/12/16; not much change in this over the course of the last week. Patient comes in today complaining of extreme back pain. Apparently she has either a kyphoplasty or vertebral plasty scheduled at [facility] radiology tomorrow. For this reason  no debridement today. We have been using Iodoflex 06/19/16; patient arrived with the surface eschar from last week removed with a curet debrided of subcutaneous tissue. She tolerates this reasonably well. She is still in a skilled facility. States her vertebral plasty last week is made her pain bearable 06/26/16; patient arrived with a new wound on the posterior aspect of her left calf. She notices 3 or 4 days ago but is not certain how this happened. She states she simply felt a stinging sensation and one day on her foot and then the next day on the back of her calf. 06/28/2016 -- Ms. Reubin Milan called urgently saying that the pain in her left foot was drastically worse over the last several hours and though she removed her compression wrap she wanted to be seen before the long holiday. 07/03/16; she came in to see Dr. Con Memos on 12/29 for pain in her left foot/heel. Nothing much was found at the time and she states that the pain is better but involved her whole foot. She has a wound on the lateral aspect of the left calf and the medial aspect of the right. The medial 1 looks as if it is just about healed however the left one still has considerable edema. 07/09/16; she arrives in clinic today with a dime-sized wound on the left posterior calf and a small linear area on the right medial calf. The area on the left was debrided with a #3 curet nonviable subcutaneous tissue, we are not making progress here. The area on the right was also debrided of surface eschar and nonviable subcutaneous tissue this has some depth to it. 07/16/16; she has a dime size wound on the left posterior calf that has some depth. This required debridement last week, although I did not feel the need to do this today. We used Iodoflex last week. Small [name], [name] ([id number]) area on the right also was not debrided. This looks like it may be close to closing 07/23/16; using Iodoflex to both wound areas. The area on the right  posterior/medial calf is just about closed. She still has a fairly substantial wound on the left posterior leg which is still problematic. 07/30/16; the area on the posterior right leg is closed. The patient will graduate to her own pressure stocking. She has a punched-out area on the left mid calf. The surface is better than last week. I did not attempt debridement. Continuing Iodoflex Is complaining about pain in the right lateral ribs this is not pleuritic. She apparently had a chest x-ray done at home the immobile X x-ray. I don't have access to that report. She is not complaining of inspiratory chest pain cough etc. 08/07/16; the posterior right leg wound is closed and she is wearing a compression stocking her son expresses satisfaction with her compliance with the stocking. He continues to have a fairly substantial punched-out circular area on the left posterior calf. This wound only came about in late December, she is not really sure how it happened. 08/13/16; no major change; small punched out wound on the left  posterior calf. I have reviewed vascular hx. Followed by Dr. Ronalee Belts, not felt to have PAD 08/21/16; the area on her left posterior calf still a small punched out wound. Debrided this with a #3 curet of surface necrotic material however the wound continues to clean up quite nicely and I think the dimensions overall her smaller. We have been using Promogran. UNFORTUNATELY the patient arrives today with a fairly substantial skin tear on the right anterior lateral leg that was sustained when an aide was helping her put on her compression stockings. This happened 3 days ago. We will continue Promogran to all wound areas now including the right leg again. She is asked to keep the wraps that we put on in place and we will discharge home health today at her request 08/28/16; the area on her left posterior calf is small punched out but appears to have some improvement still required  debridement. The large skin tear on the right lateral leg looks healthy and is smaller no debridement is required. There is no evidence of surrounding cellulitis. Using Promogran on both areas 09/04/16; the area on the left posterior calf continues to gradually improve however the large more recent skin tear on her right lateral leg arrived with a large necrotic surface. We have been using Promogran and that was used to avoid adherence to the right leg wound. 3/1 14/18; the area on the left posterior calf continues to contract. Skin tear on her right lateral leg also looks a better this week. We've been using Promogran [allergic to Silver] 09/18/16; slight change in the left posterior calf. Skin tear on her right leg is down about 50%. We've been using Promogran 09/25/16; improvement in the right posterior laceration wound. This looks like it is progressing towards closure. We are using Promogran here. No change in the small punched out wound on the posterior left calf. Also using Promogran under bilateral compression 10/03/2016 -- the patient normally sees Dr. Dellia Nims and he is here today because of logistic reasons. After reviewing her chart I note that her arterial studies were acceptable as per Dr. Nino Parsley office and he had recommended a 4-layer compression which the patient has not been tolerating very well. She has an allergy to silver and hence we are using plain collagen. She has lymphedema pumps which she is not using as advised and is being rather noncompliant. her son accompanies her today and I have had a detailed discussion about the proper way she should be using her lymphedema pumps and compliance with a 3 or 4 layer compression wrap. 10/09/16; the traumatic wound on the right lateral leg is just about closed we have been using Promogran. The area on the left posterior calf had gotten smaller but is stalled in the last 3 weeks also using Promogran. Her son states she is using the  compression pumps more religiously on top of her compression wraps 10/16/16; there traumatic wound on the right lateral leg is closed. The area on the left posterior leg we started on polymen AG last week is smaller and has improved. [name], [name] ([id number]) 10/23/16/ Using polymen. doing well only small open area remains. 10/30/16; The area on the posterior left calf is fully epithelialized and resolved. 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 Objective Constitutional Sitting or standing Blood Pressure is within target range for patient.. Pulse regular and within target range for patient.Marland Kitchen Respirations regular, non-labored and within target  range.. Temperature is normal and within the target range for the patient.. Vitals Time Taken: 1:31 PM, Height: 68 in, Weight: 178 lbs, BMI: 27.1, Temperature: 97.5 F, Pulse: 61 bpm, Respiratory Rate: 16 breaths/min, Blood Pressure: 123/50 mmHg. General Notes: wound exam: both legs are controlled in terms of edema. she has pumps at home Integumentary (Hair, Skin) Wound #9 status is Open. Original cause of wound was Gradually Appeared. The wound is located on the Left,Posterior Lower Leg. The wound measures 0cm length x 0cm width x 0cm depth; 0cm^2 area and 0cm^3 volume. The wound is limited to skin breakdown. There is no tunneling or undermining noted. There is a none present amount of drainage noted. The wound margin is flat and intact. There is no granulation within the wound bed. There is no necrotic tissue within the wound bed. The periwound skin appearance exhibited: Ecchymosis. Periwound temperature was noted as No Abnormality. The periwound has tenderness on palpation. 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 Please call or contact or office if you have any questions or concerns. 1 patient has juxtalites stocking and external compresson pumps. she has been compliant 2 she is discharged as of today Electronic Signature(s) Signed: 10/30/2016 5:52:53 PM By: [name] MD Entered By: [name] on 10/30/2016 16:07:08 Scammon, [name] ([id number]) -------------------------------------------------------------------------------- SuperBill Details Patient Name: Carrie Mulligan C. Date of Service: 10/30/2016 Medical Record Patient Account Number: [id number] Number: Treating RN: [name] 01/26/1935 (81 y.o. Other Clinician: Date of Birth/Sex: Female) Treating Azaela Caracci Primary Care Provider: [name] Provider/Extender: G Referring Provider: [name] in Treatment: 49 Diagnosis Coding ICD-10 Codes Code Description [phone] Non-pressure chronic ulcer of other part of right lower leg with fat layer exposed Chronic venous hypertension (idiopathic) with ulcer and inflammation of bilateral lower I87.333 extremity I89.0 Lymphedema, not elsewhere classified X52.84 Chronic systolic (congestive) heart failure L97.221 Non-pressure chronic ulcer of left calf limited to breakdown of skin Facility Procedures CPT4 Code: [id number] Description: 27253 - WOUND CARE VISIT-LEV 2 EST PT Modifier: Quantity: 1 Physician Procedures CPT4 Code Description: [id number] 74259 - WC PHYS LEVEL 2 - EST PT ICD-10 Description Diagnosis L97.221 Non-pressure chronic ulcer of left calf limited to Modifier:  breakdown of ski Quantity: 1 n Electronic Signature(s) Signed: 10/30/2016 5:52:53 PM By: [name] MD Entered By: [name] on 10/30/2016 16:07:37

## 2016-11-06 ENCOUNTER — Ambulatory Visit
Admission: RE | Admit: 2016-11-06 | Discharge: 2016-11-06 | Disposition: A | Discharge status: Home / Self Care | Payer: Medicare Other | Source: Ambulatory Visit | Attending: Internal Medicine | Admitting: Internal Medicine

## 2016-11-06 DIAGNOSIS — R1312 Dysphagia, oropharyngeal phase: Secondary | ICD-10-CM | POA: Diagnosis present

## 2016-11-06 DIAGNOSIS — I739 Peripheral vascular disease, unspecified: Secondary | ICD-10-CM | POA: Insufficient documentation

## 2016-11-06 DIAGNOSIS — I482 Chronic atrial fibrillation: Secondary | ICD-10-CM | POA: Diagnosis not present

## 2016-11-06 DIAGNOSIS — M545 Low back pain: Secondary | ICD-10-CM | POA: Insufficient documentation

## 2016-11-06 DIAGNOSIS — M19041 Primary osteoarthritis, right hand: Secondary | ICD-10-CM | POA: Diagnosis not present

## 2016-11-06 DIAGNOSIS — Z7901 Long term (current) use of anticoagulants: Secondary | ICD-10-CM | POA: Diagnosis not present

## 2016-11-06 DIAGNOSIS — I110 Hypertensive heart disease with heart failure: Secondary | ICD-10-CM | POA: Diagnosis not present

## 2016-11-06 DIAGNOSIS — M19042 Primary osteoarthritis, left hand: Secondary | ICD-10-CM | POA: Insufficient documentation

## 2016-11-06 DIAGNOSIS — Z952 Presence of prosthetic heart valve: Secondary | ICD-10-CM | POA: Insufficient documentation

## 2016-11-06 DIAGNOSIS — K219 Gastro-esophageal reflux disease without esophagitis: Secondary | ICD-10-CM | POA: Diagnosis not present

## 2016-11-06 DIAGNOSIS — M159 Polyosteoarthritis, unspecified: Secondary | ICD-10-CM | POA: Diagnosis not present

## 2016-11-06 DIAGNOSIS — I441 Atrioventricular block, second degree: Secondary | ICD-10-CM | POA: Diagnosis not present

## 2016-11-06 DIAGNOSIS — I5042 Chronic combined systolic (congestive) and diastolic (congestive) heart failure: Secondary | ICD-10-CM | POA: Insufficient documentation

## 2016-11-06 DIAGNOSIS — E785 Hyperlipidemia, unspecified: Secondary | ICD-10-CM | POA: Diagnosis not present

## 2016-11-06 DIAGNOSIS — M170 Bilateral primary osteoarthritis of knee: Secondary | ICD-10-CM | POA: Diagnosis not present

## 2016-11-06 NOTE — Therapy (Signed)
[place], [place], [address] Phone: [phone]   Fax:     Modified Barium Swallow  Patient Details  Name: [name] MRN: [id number] Date of Birth: [name] 20, 1935 No Data Recorded  Encounter Date: 11/06/2016      End of Session - 11/06/16 1505    Visit Number 1   Number of Visits 1   Date for SLP Re-Evaluation 11/06/16   SLP Start Time 6   SLP Stop Time  1400   SLP Time Calculation (min) 60 min   Activity Tolerance Patient tolerated treatment well      Past Medical History:  Diagnosis Date  . Arthritis    "back, hands, knees" (04/10/2016)  . Chronic atrial fibrillation ([place])    a. On Coumadin  . Chronic back pain   . 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 EF 25% by echo in 08/2015 b. RHC in 08/2015 showed normal filling pressures  . Compression fracture    "several; all in my back" (04/10/2016)  . GERD (gastroesophageal reflux disease)   . Heart murmur   . Hypertension   . S/P MVR (mitral valve replacement)    a. MVR 1994 b. redo MVR in 08/2014 - on Coumadin  . Shortness of breath dyspnea     Past Surgical History:  Procedure Laterality Date  . BREAST LUMPECTOMY Right 2005?   benign lump excision  . CARDIAC CATHETERIZATION    . [place], 2016   "MVR; MVR"  . CATARACT EXTRACTION W/PHACO Right 02/20/2015   Procedure: CATARACT EXTRACTION PHACO AND INTRAOCULAR LENS PLACEMENT (IOC);  Surgeon: Estill Cotta, MD;  Location: [facility] ORS;  Service: Ophthalmology;  Laterality: Right;  US01:31.2 AP 25.3% CDE40.13 Fluid lot # [id number]  . ELECTROPHYSIOLOGIC STUDY N/A 10/09/2015   Procedure: CARDIOVERSION;  Surgeon: Wellington Hampshire, MD;  Location: [facility] OR[facility];  Service: Cardiovascular;  Laterality: N/A;  . IR GENERIC HISTORICAL  04/01/2016   IR RADIOLOGIST EVAL & MGMT 04/01/2016 [facility]  . IR GENERIC  HISTORICAL  04/15/2016   IR VERTEBROPLASTY CERV/THOR BX INC UNI/BIL INC/INJECT/IMAGING 04/15/2016 Luanne Bras, MD [facility]  . IR GENERIC HISTORICAL  04/15/2016   IR VERTEBROPLASTY EA ADDL (T&LS) BX INC UNI/BIL INC INJECT/IMAGING 04/15/2016 Luanne Bras, MD [facility]  . IR GENERIC HISTORICAL  04/15/2016   IR VERTEBROPLASTY EA ADDL (T&LS) BX INC UNI/BIL INC INJECT/IMAGING 04/15/2016 Luanne Bras, MD [facility]  . IR GENERIC HISTORICAL  05/20/2016   IR RADIOLOGIST EVAL & MGMT 05/20/2016 [facility]  . IR GENERIC HISTORICAL  06/13/2016   IR VERTEBROPLASTY EA ADDL (T&LS) BX INC UNI/BIL INC INJECT/IMAGING 06/13/2016 Luanne Bras, MD [facility]  . IR GENERIC HISTORICAL  06/13/2016   IR SACROPLASTY BILATERAL 06/13/2016 Luanne Bras, MD [facility]  . IR GENERIC HISTORICAL  06/13/2016   IR VERTEBROPLASTY LUMBAR BX INC UNI/BIL INC/INJECT/IMAGING 06/13/2016 Luanne Bras, MD [facility]  . IRRIGATION AND DEBRIDEMENT HEMATOMA Left 07/05/2015   Procedure: IRRIGATION AND DEBRIDEMENT HEMATOMA;  Surgeon: Robert Bellow, MD;  Location: [facility] OR[facility];  Service: General;  Laterality: Left;  . pyloric stenosis  07/1937  . US ECHOCARDIOGRAPHY      There were no vitals filed for this visit.    Subjective: Patient behavior: (alertness, ability to follow instructions, etc.): The patient is alert, able to follow directions, and verbalize her swallowing history  Chief complaint: "post prandial aspiration, cough after eating"  Objective:  Radiological Procedure: A videoflouroscopic evaluation of oral-preparatory, reflex initiation, and pharyngeal phases of the swallow was performed; as well as a screening of the upper esophageal phase.  I. POSTURE: Upright in MBS chair  II. VIEW: Lateral  III. COMPENSATORY STRATEGIES: N/A  IV. BOLUSES ADMINISTERED:   Thin Liquid: 2 small cup rim sips, 3 rapid, consecutive sips   Nectar-thick Liquid: 1 moderate cup rim  sip   Honey-thick Liquid: DNT   Puree: 2 teaspoon presentations   Mechanical Soft: 1/4 graham cracker in applesauce  V. RESULTS OF EVALUATION: A. ORAL PREPARATORY PHASE: (The lips, tongue, and velum are observed for strength and coordination)       **Overall Severity Rating:  Within functional limits, minimally slow   B. SWALLOW INITIATION/REFLEX: (The reflex is normal if "triggered" by the time the bolus reached the base of the tongue)  **Overall Severity Rating: Mild; triggers while falling from the valleculae to the pyriform sinuses  C. PHARYNGEAL PHASE: (Pharyngeal function is normal if the bolus shows rapid, smooth, and continuous transit through the pharynx and there is no pharyngeal residue after the swallow)  **Overall Severity Rating: Within normal limits  D. LARYNGEAL PENETRATION: (Material entering into the laryngeal inlet/vestibule but not aspirated) None  E. ASPIRATION: None  F. ESOPHAGEAL PHASE: (Screening of the upper esophagus) In the cervical esophagus there is a finger-like protrusion along the posterior wall during swallow (does not impede flow of boluses) consistent with prominent cricopharyngeus.  An esophageal sweep showed stasis of the barium in the distal-esophagus.    ASSESSMENT: This 82 year old woman; with complaint of difficulty swallowing and coughing; is presenting with minimal oropharyngeal dysphagia.  Oral control of the bolus including oral hold, rotary mastication, and anterior to posterior transfer are slowed but within functional limits. Timing of the pharyngeal swallow is mildly delayed.  Aspects of the pharyngeal stage of swallowing including tongue base retraction, hyolaryngeal excursion, epiglottic inversion, and duration/amplitude of UES opening are within normal limits.  There is no pharyngeal residue.  There was no observed laryngeal penetration or tracheal aspiration.  In the cervical esophagus there is a finger-like protrusion along the posterior  wall during swallow (does not impede flow of boluses) consistent with prominent cricopharyngeus.  An esophageal sweep showed stasis of the barium in the distal-esophagus.  The patient's symptoms were not reproduced during this study, but she does not appear to be at significant risk for prandial aspiration.  The patient may benefit from further esophageal evaluation with a barium study.  PLAN/RECOMMENDATIONS:   A. Diet: continue usual diet   B. Swallowing Precautions: Reflux precautions   C. Recommended consultation to: barium swallow to assess esophageal function   D. Therapy recommendations: Not indicated at this time   E. Results and recommendations were discussed with the patient and her daughter immediately following the study and the final report routed to the referring MD.      Oropharyngeal dysphagia - Plan: DG OP Swallowing Func-Medicare/Speech Path, DG OP Swallowing Func-Medicare/Speech Path      G-Codes - 11-15-2016 1506    Functional Assessment Tool Used MBSS, clinical judgment   Functional Limitations Swallowing   Swallow Current Status (E3662) At least 1 percent but less than 20 percent impaired, limited or restricted   Swallow Goal Status (H4765) At least 1 percent but less than 20 percent impaired, limited or restricted   Swallow Discharge Status (Y6503) At least 1 percent but less than 20 percent impaired, limited or restricted  Problem List Patient Active Problem List   Diagnosis Date Noted  . Chronic venous insufficiency 10/27/2016  . Shortness of breath 08/14/2016  . Acute bilateral low back pain   . Pressure injury of skin 04/12/2016  . Malnutrition of moderate degree 04/11/2016  . Vertebral compression fracture ([place]) 04/10/2016  . Intractable pain 04/10/2016  . Spinal compression fracture ([place]) 04/10/2016  . Chronic systolic heart failure ([place]) 11/19/2015  . Systolic and diastolic CHF, acute on chronic ([place])   . Lymphedema   . CKD (chronic  kidney disease), stage III   . Bilateral leg edema   . Acute CHF (congestive heart failure) ([place]) 09/28/2015  . Bilateral lower extremity edema 08/30/2015  . ARF (acute renal failure) ([place]) 04/28/2015  . Hyponatremia 04/28/2015  . H/O mitral valve replacement with mechanical valve 11/04/2014  . Long term current use of anticoagulant therapy 09/28/2014  . Chronic combined systolic and diastolic CHF  21/05/7355  . 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 Generalized OA 11/28/2013  . HLD (hyperlipidemia) 11/28/2013   Leroy Sea, MS/CCC- SLP  Lou Miner 11/06/2016, 3:06 PM  [facility] [place], [place], [address] Phone: [phone]   Fax:     Name: [name] MRN: [id number] Date of Birth: [birth year]

## 2017-01-16 ENCOUNTER — Other Ambulatory Visit: Payer: Self-pay | Admitting: Student

## 2017-01-16 DIAGNOSIS — R1310 Dysphagia, unspecified: Secondary | ICD-10-CM

## 2017-01-21 ENCOUNTER — Ambulatory Visit
Admission: RE | Admit: 2017-01-21 | Discharge: 2017-01-21 | Disposition: A | Discharge status: Home / Self Care | Payer: Medicare Other | Source: Ambulatory Visit | Attending: Student | Admitting: Student

## 2017-01-21 DIAGNOSIS — K449 Diaphragmatic hernia without obstruction or gangrene: Secondary | ICD-10-CM | POA: Insufficient documentation

## 2017-01-21 DIAGNOSIS — R1310 Dysphagia, unspecified: Secondary | ICD-10-CM | POA: Diagnosis present

## 2017-02-25 DIAGNOSIS — I480 Paroxysmal atrial fibrillation: Secondary | ICD-10-CM | POA: Insufficient documentation

## 2017-02-25 NOTE — Progress Notes (Signed)
Cardiology Office Note  Date:  02/26/2017   ID:  [name], DOB October 02, 1934, MRN [id number]  PCP:  Rusty Aus, MD   Chief Complaint  Patient presents with  . other    6 month follow up. Meds reviewed by the pt. verbally. Pt. c/o shortness of breath with LE edema.     HPI:  [name] a pleasant 82 year old woman with history of  paroxysmal atrial fibrillation,  prior cardioversion April 2017 while on amiodarone,  mitral valve replacement with mechanical valve,  redo 2016  on chronic anticoagulation,  Ejection fraction 40-45% April 2017 chronic renal insufficiency,  vertebral fractures with kyphoplasty,  lymphedema who uses lymphedema compression pumps  who presents for follow-up of her paroxysmal atrial fibrillation  She presents with her son In follow-up today she reports that she is doing well She's not been in the hospital for approximately one year Reports heart rhythm well controlled on low-dose amiodarone 200 mg daily Previously concerned about hair loss on amiodarone. This has been stable  She is on high-dose diuretics daily, does have high fluid intake Weight has been stable Lab work reviewed with her creatinine 1.7, stable Wearing compression hose, lymphedema compression pumps. Dramatic improvement in her leg swelling   Chronic back pain, unsteady gait. Walks with a walker No recent falls  EKG personally reviewed by myself on todays visit Shows normal sinus rhythm rate 58 bpm intraventricular conduction delay, left anterior fascicular block No change from previous EKGs  Other past medical history reviewed  History of back surgery [place] over the summer 2017: vertebrae trauma, fell backwards She underwent kyphoplasty twice, in October November 2017 with 6 total kyphoplasty's, ranging L2-L5, S1, T6, T7. She had a very bad experience on her first surgery   hospital admission April 2017 with acute diastolic and systolic CHF, Found to have atrial  fibrillation with RVR  [facility] 2017 with echocardiogram at that time documenting ejection fraction 25% Right heart catheterization with normal filling pressures  Echo 09/2015: - Left ventricle: The cavity size was normal. There was mildconcentric hypertrophy. Systolic function was mildly tomoderately reduced. The estimated ejection fraction was in the range of 40% to 45%. Wall motion was normal; there were noregional wall motion abnormalities.  - Mitral valve: A mechanical prosthesis was present. Transvalvularvelocity was within the normal range. There was no evidence forstenosis. Peak gradient (D): 8 mm Hg. - Right ventricle: Systolic function was normal. - Tricuspid valve: There was moderate regurgitation. - Pulmonary arteries: Systolic pressure was mildly elevated. PA peak pressure: 43 mm Hg (S).   PMH:   has a past medical history of Arthritis; Chronic atrial fibrillation ([place]); Chronic back pain; Chronic combined systolic (congestive) and diastolic (congestive) heart failure ([place]); Compression fracture; GERD (gastroesophageal reflux disease); Heart murmur; Hypertension; S/P MVR (mitral valve replacement); and Shortness of breath dyspnea.  PSH:    Past Surgical History:  Procedure Laterality Date  . BREAST LUMPECTOMY Right 2005?   benign lump excision  . CARDIAC CATHETERIZATION    . [place], 2016   "MVR; MVR"  . CATARACT EXTRACTION W/PHACO Right 02/20/2015   Procedure: CATARACT EXTRACTION PHACO AND INTRAOCULAR LENS PLACEMENT (IOC);  Surgeon: Estill Cotta, MD;  Location: [facility] OR[facility];  Service: Ophthalmology;  Laterality: Right;  US01:31.2 AP 25.3% CDE40.13 Fluid lot # [id number] H  . ELECTROPHYSIOLOGIC STUDY N/A 10/09/2015   Procedure: CARDIOVERSION;  Surgeon: Wellington Hampshire, MD;  Location: [facility] OR[facility];  Service: Cardiovascular;  Laterality:  N/A;  . IR GENERIC HISTORICAL  04/01/2016   IR RADIOLOGIST EVAL & MGMT 04/01/2016  [facility]  . IR GENERIC HISTORICAL  04/15/2016   IR VERTEBROPLASTY CERV/THOR BX INC UNI/BIL INC/INJECT/IMAGING 04/15/2016 Luanne Bras, MD [facility]  . IR GENERIC HISTORICAL  04/15/2016   IR VERTEBROPLASTY EA ADDL (T&LS) BX INC UNI/BIL INC INJECT/IMAGING 04/15/2016 Luanne Bras, MD [facility]  . IR GENERIC HISTORICAL  04/15/2016   IR VERTEBROPLASTY EA ADDL (T&LS) BX INC UNI/BIL INC INJECT/IMAGING 04/15/2016 Luanne Bras, MD [facility]  . IR GENERIC HISTORICAL  05/20/2016   IR RADIOLOGIST EVAL & MGMT 05/20/2016 [facility]  . IR GENERIC HISTORICAL  06/13/2016   IR VERTEBROPLASTY EA ADDL (T&LS) BX INC UNI/BIL INC INJECT/IMAGING 06/13/2016 Luanne Bras, MD [facility]  . IR GENERIC HISTORICAL  06/13/2016   IR SACROPLASTY BILATERAL 06/13/2016 Luanne Bras, MD [facility]  . IR GENERIC HISTORICAL  06/13/2016   IR VERTEBROPLASTY LUMBAR BX INC UNI/BIL INC/INJECT/IMAGING 06/13/2016 Luanne Bras, MD [facility]  . IRRIGATION AND DEBRIDEMENT HEMATOMA Left 07/05/2015   Procedure: IRRIGATION AND DEBRIDEMENT HEMATOMA;  Surgeon: Robert Bellow, MD;  Location: [facility] ORS;  Service: General;  Laterality: Left;  . pyloric stenosis  07/1937  . US ECHOCARDIOGRAPHY      Current Outpatient Prescriptions  Medication Sig Dispense Refill  . acetaminophen (TYLENOL) 650 MG CR tablet Take 650 mg by mouth every 8 (eight) hours as needed for pain.    Marland Kitchen amiodarone (PACERONE) 200 MG tablet Take 1 tablet (200 mg total) by mouth daily. 90 tablet 3  . aspirin EC 81 MG tablet Take 81 mg by mouth daily.     . calcium carbonate (TUMS - DOSED IN MG ELEMENTAL CALCIUM) 500 MG chewable tablet Chew 1 tablet by mouth every 4 (four) hours as needed for indigestion or heartburn.    . Cholecalciferol (HM VITAMIN D3) 4000 units CAPS Take 1 capsule by mouth daily.    . furosemide (LASIX) 20 MG tablet Take 20 mg by mouth 3 (three) times daily.    Marland Kitchen KLOR-CON 10 10 MEQ tablet Take 10 mEq  by mouth daily.     Marland Kitchen liver oil-zinc oxide (DESITIN) 40 % ointment Apply 1 application topically as needed (bed sores).     . methocarbamol (ROBAXIN) 500 MG tablet Take 250 mg by mouth 4 (four) times daily.    . mirtazapine (REMERON) 15 MG tablet Take 15 mg by mouth at bedtime.    . pantoprazole (PROTONIX) 40 MG tablet Take 40 mg by mouth daily.     . polyethylene glycol (MIRALAX / GLYCOLAX) packet Take 17 g by mouth 2 (two) times daily. 14 each 0  . Probiotic Product (RISA-BID PROBIOTIC PO) Take 1 capsule by mouth 2 (two) times daily.    Marland Kitchen senna-docusate (SENOKOT-S) 8.6-50 MG tablet Take 1 tablet by mouth 2 (two) times daily. (Patient taking differently: Take 2 tablets by mouth 2 (two) times daily. ) 30 tablet 1  . sertraline (ZOLOFT) 50 MG tablet     . spironolactone (ALDACTONE) 25 MG tablet Take 25 mg by mouth every other day.    . sucralfate (CARAFATE) 1 g tablet     . traMADol (ULTRAM) 50 MG tablet     . warfarin (COUMADIN) 5 MG tablet Take 1 tablet (5 mg total) by mouth daily. 30 tablet 2   No current facility-administered medications for this visit.      Allergies:   Ace inhibitors; Hydrocodone; Mercury; Nickel; Silver; Cephalexin; Clindamycin/lincomycin;  and Penicillins   Social History:  The patient  reports that she has quit smoking. Her smoking use included Cigarettes. She has a 27.00 pack-year smoking history. She has never used smokeless tobacco. She reports that she drinks alcohol. She reports that she does not use drugs.   Family History:   family history includes Renal Disease in her father; Stroke in her mother.    Review of Systems: Review of Systems  Constitutional: Negative.   Respiratory: Negative.   Cardiovascular: Negative.   Gastrointestinal: Negative.   Musculoskeletal: Positive for back pain.       Unsteady gait  Neurological: Negative.   Psychiatric/Behavioral: Negative.   All other systems reviewed and are negative.    PHYSICAL EXAM: VS:  BP 112/60  (BP Location: Left Arm, Patient Position: Sitting, Cuff Size: Normal)   Pulse (!) 58   Ht 5\' 2"  (1.575 m)   Wt 138 lb 8 oz (62.8 kg)   BMI 25.33 kg/m  , BMI Body mass index is 25.33 kg/m. GEN: Well nourished, well developed, in no acute distress  HEENT: normal  Neck: no JVD, carotid bruits, or masses Cardiac: RRR; no murmurs, rubs, or gallops,no edema  Respiratory:  clear to auscultation bilaterally, normal work of breathing GI: soft, nontender, nondistended, + BS MS: no deformity or atrophy , Significant kyphosis Skin: warm and dry, no rash Neuro:  Strength and sensation are intact Psych: euthymic mood, full affect    Recent Labs: 04/13/2016: B Natriuretic Peptide 283.3; TSH 2.239 06/01/2016: ALT 16 07/01/2016: BUN 38; Creatinine, Ser 1.59; Hemoglobin 11.7; Platelets 220; Potassium 5.2; Sodium 137    Lipid Panel No results found for: CHOL, HDL, LDLCALC, TRIG    Wt Readings from Last 3 Encounters:  02/26/17 138 lb 8 oz (62.8 kg)  10/21/16 141 lb (64 kg)  08/30/16 136 lb 4 oz (61.8 kg)       ASSESSMENT AND PLAN:  Paroxysmal atrial fibrillation (HCC) - Plan: EKG 12-Lead Appears to be maintaining normal sinus rhythm Continue  amiodarone 200 mg daily given history of paroxysmal atrial fibrillation and flutter  Typical atrial flutter ([place]) - Plan: EKG 12-Lead Normal sinus rhythm restored. Previously noted on EKG  in 1660  Systolic and diastolic CHF, acute on chronic (HCC) Appears relatively euvolemic on today's visit Appears to be prerenal though numbers are stable  Mixed hyperlipidemia Currently not on a statin Monitored by Dr. Sabra Heck  H/O mitral valve replacement with mechanical valve Echocardiogram April 2017 with well functioning mitral valve No problems on Coumadin  Encounter for anticoagulation discussion and counseling Discussion about amiodarone and warfarin interaction   Total encounter time more than 25 minutes  Greater than 50% was spent in counseling  and coordination of care with the patient   Disposition:   F/U  12 months   Orders Placed This Encounter  Procedures  . EKG 12-Lead     Signed, Esmond Plants, M.D., Ph.D. 02/26/2017  [place], [place]

## 2017-02-26 ENCOUNTER — Encounter: Payer: Self-pay | Admitting: Cardiovascular Disease

## 2017-02-26 ENCOUNTER — Ambulatory Visit (INDEPENDENT_AMBULATORY_CARE_PROVIDER_SITE_OTHER): Payer: Medicare Other | Admitting: Cardiovascular Disease

## 2017-02-26 VITALS — BP 112/60 | HR 58 | Ht 62.0 in | Wt 138.5 lb

## 2017-02-26 DIAGNOSIS — R600 Localized edema: Secondary | ICD-10-CM

## 2017-02-26 DIAGNOSIS — I480 Paroxysmal atrial fibrillation: Secondary | ICD-10-CM

## 2017-02-26 DIAGNOSIS — Z952 Presence of prosthetic heart valve: Secondary | ICD-10-CM | POA: Diagnosis not present

## 2017-02-26 DIAGNOSIS — IMO0001 Reserved for inherently not codable concepts without codable children: Secondary | ICD-10-CM

## 2017-02-26 DIAGNOSIS — I5042 Chronic combined systolic (congestive) and diastolic (congestive) heart failure: Secondary | ICD-10-CM | POA: Diagnosis not present

## 2017-02-26 DIAGNOSIS — M4850XS Collapsed vertebra, not elsewhere classified, site unspecified, sequela of fracture: Secondary | ICD-10-CM | POA: Diagnosis not present

## 2017-02-26 DIAGNOSIS — N183 Chronic kidney disease, stage 3 unspecified: Secondary | ICD-10-CM

## 2017-02-26 DIAGNOSIS — R0602 Shortness of breath: Secondary | ICD-10-CM

## 2017-02-26 NOTE — Patient Instructions (Addendum)

## 2017-05-14 ENCOUNTER — Telehealth: Payer: Self-pay | Admitting: Cardiovascular Disease

## 2017-05-14 NOTE — Telephone Encounter (Signed)
Spoke with patients son and the last couple of weeks she has been weak and just not feeling well. Oxygen saturation has been normal but her heart rates have been very erratic 55 to 82. Advised heart rate can range from mid 50's to low 100's and to continue monitoring. He reports that she had been out walking but due to the weather she has not been able to do that. He also reports that the last 2 winters she has been hospitalized at the end of the year. So he wanted to call in and make [place] aware. Reviewed the importance of continued activity to help prevent weakness. Discussed how at this time of the year people avoid getting out due to coldness and weather which can cause them to be deconditioned which in turn can cause that tiredness as well. Instructed him to continue monitoring and call back if her symptoms persist or worsen. He verbalized understanding of our conversation, agreeable with plan, and had no further questions or concerns at this time.

## 2017-05-14 NOTE — Telephone Encounter (Signed)
Patient son calling Pt has had an erratic heart rate Last visit was wonderful but lately has been real weak, no energy Would like to discuss with nurse if necessary to make appt Please call

## 2017-05-14 NOTE — Telephone Encounter (Signed)
Last clinic visit was normal sinus rhythm She does have history of paroxysmal atrial fibrillation Certainly possible her erratic heart rate could be atrial fibrillation Options include come in for EKG Other option would be to monitor pulse and if it is regular, perhaps less likely atrial fibrillation

## 2017-05-15 NOTE — Telephone Encounter (Signed)
Spoke with patients son per release form and he states that he did call PCP and they will be seeing them on Tuesday. He reports they are going to try and wait until then. Advised that if she should have any changes to please seek evaluation in the ED. He was appreciative for the call back and had no further questions at this time.

## 2017-05-15 NOTE — Telephone Encounter (Signed)
Patient son returning call   Please call again   Son says patient is not able to stay awake or keep her eyes open.  She has also fallen asleep in the car   Patient son is also worried about mechanical valve not working possible failure resulting in not being able to be resuscitated   Patient son wants her seen asap   Per son patient declines appt with APP staff only wants to see Dr. Rockey Situ

## 2017-05-15 NOTE — Telephone Encounter (Signed)
Spoke with patients son and he reports that she has not been able to stay awake, she is confused, disoriented to day and time. He denies any slurred speech for her and states that he wonders if this could be a UTI. Advised that she definitely needs to be seen today if at all possible. He reports that he will call her PCP Dr. Sabra Heck to see if they can see her today. Advised that if it is cardiac related she may need to have further evaluation in the ED. He verbalized understanding and stated that he wanted to contact her PCP first to see if they can get her in to be seen. He was appreciative for the call back and had no further questions at this time. Requested that he call back later with update on her condition.

## 2017-05-15 NOTE — Telephone Encounter (Signed)
Left voicemail message to call back  

## 2017-05-31 DIAGNOSIS — Z950 Presence of cardiac pacemaker: Secondary | ICD-10-CM

## 2017-05-31 HISTORY — DX: Presence of cardiac pacemaker: Z95.0

## 2017-08-13 ENCOUNTER — Other Ambulatory Visit: Payer: Self-pay | Admitting: Internal Medicine

## 2017-08-13 DIAGNOSIS — S32010A Wedge compression fracture of first lumbar vertebra, initial encounter for closed fracture: Secondary | ICD-10-CM

## 2017-08-13 DIAGNOSIS — M5116 Intervertebral disc disorders with radiculopathy, lumbar region: Secondary | ICD-10-CM

## 2017-08-20 ENCOUNTER — Ambulatory Visit
Admission: RE | Admit: 2017-08-20 | Discharge: 2017-08-20 | Disposition: A | Discharge status: Home / Self Care | Payer: Medicare Other | Source: Ambulatory Visit | Attending: Internal Medicine | Admitting: Internal Medicine

## 2017-08-20 DIAGNOSIS — R29890 Loss of height: Secondary | ICD-10-CM | POA: Diagnosis not present

## 2017-08-20 DIAGNOSIS — S32010A Wedge compression fracture of first lumbar vertebra, initial encounter for closed fracture: Secondary | ICD-10-CM | POA: Diagnosis not present

## 2017-08-20 DIAGNOSIS — M5116 Intervertebral disc disorders with radiculopathy, lumbar region: Secondary | ICD-10-CM | POA: Diagnosis present

## 2017-08-22 ENCOUNTER — Ambulatory Visit: Payer: Medicare Other

## 2017-08-29 ENCOUNTER — Inpatient Hospital Stay: Admission: RE | Admit: 2017-08-29 | Payer: Medicare Other | Source: Ambulatory Visit

## 2017-08-29 ENCOUNTER — Encounter: Payer: Self-pay | Admitting: Physician Assistant

## 2017-08-29 ENCOUNTER — Ambulatory Visit (INDEPENDENT_AMBULATORY_CARE_PROVIDER_SITE_OTHER): Payer: Medicare Other | Admitting: Physician Assistant

## 2017-08-29 VITALS — BP 140/70 | HR 63 | Ht 64.0 in | Wt 147.5 lb

## 2017-08-29 DIAGNOSIS — N183 Chronic kidney disease, stage 3 unspecified: Secondary | ICD-10-CM

## 2017-08-29 DIAGNOSIS — I5043 Acute on chronic combined systolic (congestive) and diastolic (congestive) heart failure: Secondary | ICD-10-CM

## 2017-08-29 DIAGNOSIS — Z952 Presence of prosthetic heart valve: Secondary | ICD-10-CM

## 2017-08-29 DIAGNOSIS — I480 Paroxysmal atrial fibrillation: Secondary | ICD-10-CM | POA: Diagnosis not present

## 2017-08-29 DIAGNOSIS — Z01810 Encounter for preprocedural cardiovascular examination: Secondary | ICD-10-CM | POA: Diagnosis not present

## 2017-08-29 DIAGNOSIS — Z79899 Other long term (current) drug therapy: Secondary | ICD-10-CM | POA: Diagnosis not present

## 2017-08-29 DIAGNOSIS — R0602 Shortness of breath: Secondary | ICD-10-CM

## 2017-08-29 NOTE — Patient Instructions (Addendum)
Medication Instructions:  Your physician has recommended you make the following change in your medication:  TAKE metolazone 2.5mg  ONCE this afternoon. TAKE ONE extra potassium 52mEq today only.    Labwork: BMET on Monday, March 4 at the [facility] lab the morning of your echo.    Testing/Procedures: Your physician has requested that you have an echocardiogram MONDAY, MARCH 4 at the [facility]. Arrival time 10:45am Echocardiography is a painless test that uses sound waves to create images of your heart. It provides your doctor with information about the size and shape of your heart and how well your heart's chambers and valves are working. This procedure takes approximately one hour. There are no restrictions for this procedure.  Please let Registration know you are there for echo AND labs.   Follow-Up: Your physician recommends that you schedule a follow-up appointment in: 1 month with Dr. Rockey Situ.    Any Other Special Instructions Will Be Listed Below (If Applicable).  We will make an appointment with Herbie Drape, RN, in the coumadin clinic.    If you need a refill on your cardiac medications before your next appointment, please call your pharmacy.  Echocardiogram An echocardiogram, or echocardiography, uses sound waves (ultrasound) to produce an image of your heart. The echocardiogram is simple, painless, obtained within a short period of time, and offers valuable information to your health care provider. The images from an echocardiogram can provide information such as:  Evidence of coronary artery disease (CAD).  Heart size.  Heart muscle function.  Heart valve function.  Aneurysm detection.  Evidence of a past heart attack.  Fluid buildup around the heart.  Heart muscle thickening.  Assess heart valve function.  Tell a health care provider about:  Any allergies you have.  All medicines you are taking, including vitamins, herbs, eye drops, creams, and  over-the-counter medicines.  Any problems you or family members have had with anesthetic medicines.  Any blood disorders you have.  Any surgeries you have had.  Any medical conditions you have.  Whether you are pregnant or may be pregnant. What happens before the procedure? No special preparation is needed. Eat and drink normally. What happens during the procedure?  In order to produce an image of your heart, gel will be applied to your chest and a wand-like tool (transducer) will be moved over your chest. The gel will help transmit the sound waves from the transducer. The sound waves will harmlessly bounce off your heart to allow the heart images to be captured in real-time motion. These images will then be recorded.  You may need an IV to receive a medicine that improves the quality of the pictures. What happens after the procedure? You may return to your normal schedule including diet, activities, and medicines, unless your health care provider tells you otherwise. This information is not intended to replace advice given to you by your health care provider. Make sure you discuss any questions you have with your health care provider. Document Released: 06/14/2000 Document Revised: 02/03/2016 Document Reviewed: 02/22/2013 Elsevier Interactive Patient Education  2017 [employer].

## 2017-08-29 NOTE — Progress Notes (Signed)
Cardiology Office Note Date:  08/29/2017  Patient ID:  Carrie, [name] September 24, 1934, MRN [id number] PCP:  Rusty Aus, MD  Cardiologist:  Dr. Rockey Situ, MD    Chief Complaint: SOB  History of Present Illness: [name] [name] is a 83 y.o. female with history of PAF/flutter on Coumadin and amiodarone s/p prior DCCV in 09/2015, status post mechanical mitral valve in 1994 with redo in 2016 on Coumadin, chronic systolic CHF, pulmonary hypertension, CKD stage III-IV with a baseline SCr ~ 1.7-2.0, HLD, chronic back pain with vertebral fractures s/p kyphoplasty, lymphedema who presents for SOB.  Prior evaluation at [facility] in 08/2015 showed an EF of 25% with RHC showing normal filling pressures. Echo in 09/2015 showed mild concentric LVH, EF 40-45%, normal wall motion, mechanical mitral valve was noted and functioing well, RV systolic function normal, moderate tricuspid regurgitation, PASP 43 mmHg. She was most recently seen by Dr. Rockey Situ on 02/26/2017 with a weight of 138 pounds. She was doing well at that time. She was seen by PCP in late 07/2017 and noted to be 3 pounds up by their scale with a weight of 148 pounds. She was taking Lasix 20 mg tid, metolazone 2.5 mg prn, ASA 81 mg, spironolactone 25 mg.   Labs: 08/27/2017 SCr 1.7 (baseline ~ 1.7-2), K+ 4.9, HGB 15.0, INR 1.5.  She is scheduled for kyphoplasty on 09/04/17.  Her Coumadin is managed by her primary care physician and this has been held with a Lovenox bridge under the direction of their office.  She comes in today accompanied by her daughter and son.  She reports over the past week she has noted significant exertional shortness of breath.  Weight up 9 pounds from 02/26/2017 visit (138-->147 pounds).  She has a stable one pillow orthopnea with the head of the bed mildly elevated which has been the same for numerous years.  She does not have any lower extremity swelling or abdominal distention.  She continues to eat 3 full meals per day and  denies any early satiety.  She does continue to drink copious amounts of water daily and this concerns her son quite a bit.  Her son is also concerned that during the winter months when it is cold outside the patient is more sedentary.  He asks that I talk with her regarding "her behavioral issues."  He relates this to the patient not listening to him when he asks her to ambulate even when it is cold outside.  He has asked her to travel to the mall to ambulate.  He does not feel like her ambulating in her house is sufficient enough.  He also requests that the patient be transitioned from Lovenox to heparin bridge as he feels like Lovenox in the past has "caused a setback."  She denies any chest pain.  She has remained compliant with Lasix 20 mg 3 times daily.  She has not taken any metolazone for the past 2 weeks.   Past Medical History:  Diagnosis Date  . Arthritis    "back, hands, knees" (04/10/2016)  . Chronic atrial fibrillation ([place])    a. On Coumadin  . Chronic back pain   . 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 EF 25% by echo in 08/2015 b. RHC in 08/2015 showed normal filling pressures  . Compression fracture    "several; all in my back" (04/10/2016)  . GERD (gastroesophageal reflux disease)   . Hypertension   .  S/P MVR (mitral valve replacement)    a. MVR 1994 b. redo MVR in 08/2014 - on Coumadin  . Shortness of breath dyspnea     Past Surgical History:  Procedure Laterality Date  . BREAST LUMPECTOMY Right 2005?   benign lump excision  . CARDIAC CATHETERIZATION    . [place], 2016   "MVR; MVR"  . CATARACT EXTRACTION W/PHACO Right 02/20/2015   Procedure: CATARACT EXTRACTION PHACO AND INTRAOCULAR LENS PLACEMENT (IOC);  Surgeon: Estill Cotta, MD;  Location: [facility] ORS;  Service: Ophthalmology;  Laterality: Right;  US01:31.2 AP 25.3% CDE40.13 Fluid lot # [id number]  . ELECTROPHYSIOLOGIC STUDY N/A  10/09/2015   Procedure: CARDIOVERSION;  Surgeon: Wellington Hampshire, MD;  Location: [facility] ORS;  Service: Cardiovascular;  Laterality: N/A;  . IR GENERIC HISTORICAL  04/01/2016   IR RADIOLOGIST EVAL & MGMT 04/01/2016 [facility]  . IR GENERIC HISTORICAL  04/15/2016   IR VERTEBROPLASTY CERV/THOR BX INC UNI/BIL INC/INJECT/IMAGING 04/15/2016 Luanne Bras, MD [facility]  . IR GENERIC HISTORICAL  04/15/2016   IR VERTEBROPLASTY EA ADDL (T&LS) BX INC UNI/BIL INC INJECT/IMAGING 04/15/2016 Luanne Bras, MD [facility]  . IR GENERIC HISTORICAL  04/15/2016   IR VERTEBROPLASTY EA ADDL (T&LS) BX INC UNI/BIL INC INJECT/IMAGING 04/15/2016 Luanne Bras, MD [facility]  . IR GENERIC HISTORICAL  05/20/2016   IR RADIOLOGIST EVAL & MGMT 05/20/2016 [facility]  . IR GENERIC HISTORICAL  06/13/2016   IR VERTEBROPLASTY EA ADDL (T&LS) BX INC UNI/BIL INC INJECT/IMAGING 06/13/2016 Luanne Bras, MD [facility]  . IR GENERIC HISTORICAL  06/13/2016   IR SACROPLASTY BILATERAL 06/13/2016 Luanne Bras, MD [facility]  . IR GENERIC HISTORICAL  06/13/2016   IR VERTEBROPLASTY LUMBAR BX INC UNI/BIL INC/INJECT/IMAGING 06/13/2016 Luanne Bras, MD [facility]  . IRRIGATION AND DEBRIDEMENT HEMATOMA Left 07/05/2015   Procedure: IRRIGATION AND DEBRIDEMENT HEMATOMA;  Surgeon: Robert Bellow, MD;  Location: [facility] ORS;  Service: General;  Laterality: Left;  . pyloric stenosis  07/1937  . US ECHOCARDIOGRAPHY      Current Meds  Medication Sig  . acetaminophen (TYLENOL) 650 MG CR tablet Take 650 mg by mouth every 8 (eight) hours as needed for pain.  Marland Kitchen amiodarone (PACERONE) 200 MG tablet Take 1 tablet (200 mg total) by mouth daily.  Marland Kitchen aspirin EC 81 MG tablet Take 81 mg by mouth daily.   . Cholecalciferol (VITAMIN D) 2000 units tablet Take 2,000 Units by mouth daily.  . furosemide (LASIX) 20 MG tablet Take 20 mg by mouth 3 (three) times daily.  Marland Kitchen KLOR-CON 10 10 MEQ tablet Take 10 mEq by mouth  daily.   . Menthol (ICY HOT) 5 % PTCH Apply 1 patch topically daily as needed (pain).  . Multiple Vitamin (MULTIVITAMIN WITH MINERALS) TABS tablet Take 1 tablet by mouth daily.  . polyethylene glycol (MIRALAX / GLYCOLAX) packet Take 17 g by mouth 2 (two) times daily. (Patient taking differently: Take 17 g by mouth daily as needed for mild constipation. )  . Probiotic Product (RISA-BID PROBIOTIC PO) Take 1 capsule by mouth daily.   . ranitidine (ZANTAC) 150 MG tablet Take 300 mg by mouth at bedtime.  . senna-docusate (SENOKOT-S) 8.6-50 MG tablet Take 1 tablet by mouth 2 (two) times daily.  Marland Kitchen spironolactone (ALDACTONE) 25 MG tablet Take 25 mg by mouth daily.   . vitamin C (ASCORBIC ACID) 500 MG tablet Take 500 mg by mouth daily.  Marland Kitchen warfarin (COUMADIN) 6 MG tablet Take 3-6 mg by mouth  See admin instructions. Take 3 mg once daily at night except for Wednesdays take 6 mg once nightly    Allergies:   Ace inhibitors; Hydrocodone; Mercury; Silver; Cephalexin; Clindamycin/lincomycin; Nickel; and Penicillins   Social History:  The patient  reports that she has quit smoking. Her smoking use included cigarettes. She has a 27.00 pack-year smoking history. she has never used smokeless tobacco. She reports that she drinks alcohol. She reports that she does not use drugs.   Family History:  The patient's family history includes Renal Disease in her father; Stroke in her mother.  ROS:   Review of Systems  Constitutional: Positive for malaise/fatigue. Negative for chills, diaphoresis, fever and weight loss.  HENT: Negative for congestion.   Eyes: Negative for discharge and redness.  Respiratory: Positive for shortness of breath. Negative for cough, hemoptysis, sputum production and wheezing.   Cardiovascular: Negative for chest pain, palpitations, orthopnea, claudication, leg swelling and PND.  Gastrointestinal: Negative for abdominal pain, blood in stool, heartburn, melena, nausea and vomiting.    Genitourinary: Negative for hematuria.  Musculoskeletal: Positive for back pain and myalgias. Negative for falls.  Skin: Negative for rash.  Neurological: Positive for weakness. Negative for dizziness, tingling, tremors, sensory change, speech change, focal weakness and loss of consciousness.  Endo/Heme/Allergies: Does not bruise/bleed easily.  Psychiatric/Behavioral: Negative for substance abuse. The patient is not nervous/anxious.   All other systems reviewed and are negative.    PHYSICAL EXAM:  VS:  BP 140/70 (BP Location: Left Arm, Patient Position: Sitting, Cuff Size: Normal)   Pulse 63   Ht _0  (1.626 m)   Wt 147 lb 8 oz (66.9 kg)   BMI 25.32 kg/m  BMI: Body mass index is 25.32 kg/m.  Physical Exam  Constitutional: She is oriented to person, place, and time. She appears well-developed and well-nourished.  Elderly appearing  HENT:  Head: Normocephalic and atraumatic.  Eyes: Right eye exhibits no discharge. Left eye exhibits no discharge.  Neck: Normal range of motion. JVD present.  Cardiovascular: Normal rate, regular rhythm, S1 normal, S2 normal and normal heart sounds. Exam reveals no distant heart sounds, no friction rub, no midsystolic click and no opening snap.  No murmur heard. Pulses:      Posterior tibial pulses are 2+ on the right side, and 2+ on the left side.  Mechanical mitral valve noted  Pulmonary/Chest: Effort normal and breath sounds normal. No respiratory distress. She has no decreased breath sounds. She has no wheezes. She has no rales. She exhibits no tenderness.  Abdominal: Soft. She exhibits no distension. There is no tenderness.  Musculoskeletal: She exhibits no edema.  Neurological: She is alert and oriented to person, place, and time.  Skin: Skin is warm and dry. No cyanosis. Nails show no clubbing.  Psychiatric: She has a normal mood and affect. Her speech is normal and behavior is normal. Judgment and thought content normal.     EKG:  Was  ordered and interpreted by me today. Shows NSR, 63 bpm, left axis deviation, left bundle branch block (unchanged from prior)  Recent Labs: No results found for requested labs within last 8760 hours.  No results found for requested labs within last 8760 hours.   CrCl cannot be calculated (Patient's most recent lab result is older than the maximum 21 days allowed.).   Wt Readings from Last 3 Encounters:  08/29/17 147 lb 8 oz (66.9 kg)  02/26/17 138 lb 8 oz (62.8 kg)  10/21/16 141 lb (64 kg)  Other studies reviewed: Additional studies/records reviewed today include: summarized above  ASSESSMENT AND PLAN:  1. Acute on chronic combined CHF/pulmonary hypertension: New [place] Heart Association class III.  She does appear mildly volume overloaded on exam today with faint bibasilar crackles and mildly elevated JVD.  She will take metolazone 2.5 mg this afternoon with her afternoon dose of Lasix 20 mg.  She will take an added potassium for a total of 20 mEq with her metolazone/Lasix dose this afternoon.  She will otherwise continue Lasix 20 mg 3 times daily.  Not on beta-blocker secondary to prior bradycardia.  Not on ACE inhibitor secondary to cough.  Consider trial of ARB though will defer to patient's primary cardiologist.  Continue spironolactone.  Could consider hydralazine/Imdur.  CHF education provided.  Check echocardiogram to evaluate LV systolic function, mitral valve, and right-sided pressure.  Check be met on 3/4 following metolazone as above.  2. Preoperative cardiac evaluation: No formal risk stratification paperwork has been submitted to this office.  I recommend obtaining echocardiogram to evaluate LV systolic function, mitral valve function, and right-sided pressure prior to providing risk stratification as detailed above.  Medical clearance will be deferred to PCP.  3. PAF/flutter: Currently in sinus rhythm with heart rate in the 60s BPM.  Continue amiodarone 200 mg daily.  Not on  beta-blocker given history of bradycardia.  Coumadin has been managed by PCP.  Coumadin has been subtherapeutic as detailed below.  CHADS2VASc at least 5 (CHF, HTN, age x 2, female).  4. Status post mechanical mitral valve: Coumadin followed by PCP.  INR has been subtherapeutic dating back to 06/19/2017.  She is not due to hold her Coumadin until 3/2 and then she will hold Coumadin followed by Lovenox bridge under the direction of PCP his Coumadin clinic.  Patient plans to transition to [facility] heart care Coumadin clinic following her possible back surgery.  Continue aspirin.  During the perioperative timeframe, I defer all management of patient's Coumadin and Lovenox to PCP as they are managing this at this time.  Patient and family are aware of increased stroke risk with subtherapeutic INR and during bridging as above.  5. Chronic back pain: Per orthopedics.  6. Physical deconditioning: Advised patient to perform activity as tolerated.  May benefit from physical therapy.  Defer to PCP.  7. CKD stage III: Check be met as above, monitor with diuresis.   Disposition: F/u with Dr. Rockey Situ in 1-3 months.  Current medicines are reviewed at length with the patient today.  The patient did not have any concerns regarding medicines.  Signed, Christell Faith, PA-C 08/29/2017 10:15 AM     [address] [place], [place] [address] [phone]

## 2017-09-01 ENCOUNTER — Ambulatory Visit
Admission: RE | Admit: 2017-09-01 | Discharge: 2017-09-01 | Disposition: A | Discharge status: Home / Self Care | Payer: Medicare Other | Source: Ambulatory Visit | Attending: Physician Assistant | Admitting: Physician Assistant

## 2017-09-01 ENCOUNTER — Other Ambulatory Visit
Admission: RE | Admit: 2017-09-01 | Discharge: 2017-09-01 | Disposition: A | Discharge status: Home / Self Care | Payer: Medicare Other | Source: Ambulatory Visit | Attending: Physician Assistant | Admitting: Physician Assistant

## 2017-09-01 ENCOUNTER — Telehealth: Payer: Self-pay | Admitting: Cardiovascular Disease

## 2017-09-01 DIAGNOSIS — Z79899 Other long term (current) drug therapy: Secondary | ICD-10-CM

## 2017-09-01 DIAGNOSIS — R0602 Shortness of breath: Secondary | ICD-10-CM | POA: Insufficient documentation

## 2017-09-01 DIAGNOSIS — Z952 Presence of prosthetic heart valve: Secondary | ICD-10-CM | POA: Diagnosis not present

## 2017-09-01 DIAGNOSIS — I082 Rheumatic disorders of both aortic and tricuspid valves: Secondary | ICD-10-CM | POA: Diagnosis not present

## 2017-09-01 DIAGNOSIS — I4891 Unspecified atrial fibrillation: Secondary | ICD-10-CM | POA: Insufficient documentation

## 2017-09-01 DIAGNOSIS — I10 Essential (primary) hypertension: Secondary | ICD-10-CM | POA: Insufficient documentation

## 2017-09-01 LAB — BASIC METABOLIC PANEL
Anion gap: 12 (ref 5–15)
BUN: 50 mg/dL — AB (ref 6–20)
CALCIUM: 9.7 mg/dL (ref 8.9–10.3)
CO2: 24 mmol/L (ref 22–32)
CREATININE: 1.41 mg/dL — AB (ref 0.44–1.00)
Chloride: 101 mmol/L (ref 101–111)
GFR calc Af Amer: 40 mL/min — ABNORMAL LOW (ref >60)
GFR calc non Af Amer: 34 mL/min — ABNORMAL LOW (ref >60)
GLUCOSE: 105 mg/dL — AB (ref 65–99)
Potassium: 4.7 mmol/L (ref 3.5–5.1)
Sodium: 137 mmol/L (ref 135–145)

## 2017-09-01 NOTE — Telephone Encounter (Signed)
Please call daughter with echo results.

## 2017-09-01 NOTE — Progress Notes (Signed)
*  PRELIMINARY RESULTS* Echocardiogram 2D Echocardiogram has been performed.  [name] 09/01/2017, 11:57 AM

## 2017-09-01 NOTE — Telephone Encounter (Signed)
Spoke with patients daughter per release form and advised that it takes 24-48 hours and that we would call her with results. She verbalized understanding and was appreciative for the call back.

## 2017-09-02 ENCOUNTER — Other Ambulatory Visit: Payer: Self-pay

## 2017-09-02 DIAGNOSIS — Z79899 Other long term (current) drug therapy: Secondary | ICD-10-CM

## 2017-09-02 MED ORDER — LOSARTAN POTASSIUM 25 MG PO TABS: 12.5000 mg | ORAL_TABLET | Freq: Every day | ORAL | 3 refills | Status: DC

## 2017-09-02 NOTE — Telephone Encounter (Signed)
Spoke with patients daughter per release form and reviewed results with her in detail. EF in 2017 was 40-45% and now is 35-40%. Reviewed recommendations and she repeated all information. She was very appreciative for the call back and review of results. She verbalized understanding of our conversation, agreement with plan, and had no further questions at this time.

## 2017-09-02 NOTE — Telephone Encounter (Signed)
Left voicemail message to call back  

## 2017-09-02 NOTE — Telephone Encounter (Signed)
Patient daughter would like to review ef results .  She was told by Dr. Dolly Rias was a different number and she just wants to clarify before starting medication    Please call

## 2017-09-02 NOTE — Telephone Encounter (Signed)
See echo results note. Patient's daughter, [name], had inquired if Dr. Rockey Situ would also see echo results.  Per Jeannene Patella, RN, Dr. Rockey Situ reviewed echo last evening (March 4).

## 2017-09-02 NOTE — Telephone Encounter (Signed)
Patient daughter calling about echo results.  She states per conversation with Ivin Booty she was offered an opportunity to review with md and would like them to call her.

## 2017-09-02 NOTE — Telephone Encounter (Signed)
Patient daughter [name] returning call  °

## 2017-09-03 ENCOUNTER — Other Ambulatory Visit: Payer: Medicare Other

## 2017-09-05 ENCOUNTER — Telehealth: Payer: Self-pay | Admitting: Cardiovascular Disease

## 2017-09-05 NOTE — Telephone Encounter (Signed)
I spoke with Kristina, [place]. Patient scheduled for 3/14 kyphoplasty. Office inquiring if Dr. Rockey Situ will monitor Lovenox bridge. Patient has INR monitored by [facility] PCP, Dr. Ammie Ferrier office.   Per Christell Faith, PA March 1 notes: "Patient plans to transition to [facility] heart care Coumadin clinic following her possible back surgery.  Continue aspirin.  During the perioperative timeframe, I defer all management of patient's Coumadin and Lovenox to PCP as they are managing this at this time.  Patient and family are aware of increased stroke risk with subtherapeutic INR and during bridging as above."  I discussed with Dr. Rockey Situ who states that since she has had subtherapeutic INR (see [place] labs)  and son reporting that patient "didn't do well" on Lovenox that he does not recommend Lovenox bridge at this time.   Per Kristina's request, I have routed this note to her, [phone]. Dr. Rockey Situ aware and agreeable.

## 2017-09-05 NOTE — Telephone Encounter (Addendum)
I spoke with patient's son, Helene Kelp, who states he is concerned of patient's worsening SOB and fatigue, stating she is worse now that she ever has been. Patient is scheduled for kyphoplasty March 14 but he does not feel like she can tolerate it due to abdominal distention and need to lie on her abdomen.  He reports hearing her "gurgling" at night while trying to sleep and asks for her to be seen ASAP by Dr. Rockey Situ. I reviewed with MD - first available 3/12, 10:20am. Dr. Rockey Situ advised patient could take an extra lasix today but not advisable to increase on daily basis due to rising BUN. He also advises that she does not need Lovenox bridge. Last therapeutic INR 06/19/17.  I have reviewed this information with the son and the need to have her seen in the ED for SOB and abdominal distention.  Helene Kelp states he would like the March 12 appointment but will have her evaluated in the ED if sx persist or worsen.

## 2017-09-05 NOTE — Telephone Encounter (Signed)
Patient son calling and they feel this is an urgent situation  Patient son [name] would like to speak with nurse before orthopedic visit with Dr Mina Marble today to discuss procedure They feel that patient should be hospitalized, she has not been doing well and they are not sure they will want patient to continue with this procedure at this time Would like some advise Please call to discuss

## 2017-09-05 NOTE — Telephone Encounter (Signed)
Orthopaedics called, state Dr. Rockey Situ will need to manage pt Lovenox for procedure on back- kyphoplasty. Please call.

## 2017-09-07 ENCOUNTER — Telehealth: Payer: Self-pay | Admitting: Cardiovascular Disease

## 2017-09-07 ENCOUNTER — Other Ambulatory Visit: Payer: Self-pay | Admitting: Cardiovascular Disease

## 2017-09-07 MED ORDER — ENOXAPARIN SODIUM 80 MG/0.8ML ~~LOC~~ SOLN: 70.0000 mg | Freq: Two times a day (BID) | SUBCUTANEOUS | 1 refills | Status: DC

## 2017-09-07 NOTE — Progress Notes (Signed)
Cardiology Office Note  Date:  09/09/2017   ID:  [name], DOB 1934-08-29, MRN [id number]  PCP:  Rusty Aus, MD   Chief Complaint  Patient presents with  . OTHER    Sob with very little walking and swelling stomach and lower extremities. *Refused EKG* Meds reviewed verbally with pt.    HPI:  [name] a pleasant 83 year old woman with history of  paroxysmal atrial fibrillation,  prior cardioversion April 2017 while on amiodarone,  mitral valve replacement with mechanical valve,  redo 2016  on chronic anticoagulation,  Ejection fraction 40-45% April 2017 chronic renal insufficiency,  vertebral fractures with kyphoplasty,  lymphedema who uses lymphedema compression pumps  who presents for follow-up of her paroxysmal atrial fibrillation  She is scheduled for kyphoplasty on 09/04/17. This has been delayed per the patient and family secondary to 10 pound weight gain worsening leg swelling abdominal bloating shortness of breath  Weight at home 148 pounds, on her last clinic visit was 138 pounds She does drink significant fluids at home including soda, coffee, water, tea Taking Lasix 60 daily (20 mg 3 times daily) Family particularly sound is very concerned about her inactivity through the winter, feels her legs are getting weaker. She tries to go outside but it is cold He feels she is very unmotivated and getting more debilitated Currently lives with her daughter who does all of her medications Dietary changes through the winter may explain INR, subtherapeutic for the past 3 months  Review of INR managed by primary care 2 or less since December 2018 Family unaware of the importance of maintaining INR 2.5 or higher for her mitral mechanical valve  Currently taking warfarin 3 mg every day with 6 mg on Wednesday  Limited by chronic back pain, worse recently Lab work reviewed with her in detail BMP shows BUN 50, up from 30 CR 1.4  EKG not performed on today's  visit, patient declined  Echocardiogram reviewed in detail Left ventricle: The cavity size was normal. There was mild   concentric hypertrophy. Systolic function was moderately reduced.   The estimated ejection fraction was in the range of 35% to 40%.   Images were inadequate for LV wall motion assessment. The study   is not technically sufficient to allow evaluation of LV diastolic   function. - Aortic valve: There was mild to moderate stenosis. Mean gradient   (S): 10 mm Hg. Valve area (VTI): 1.17 cm^2. - Mitral valve: A mechanical prosthesis was present and functioning   normally. Mean gradient (D): 3 mm Hg. - Left atrium: The atrium was mildly dilated. - Pulmonary arteries: Systolic pressure was mildly increased. PA   peak pressure: 40 mm Hg (S).  Other past medical history reviewed Reports heart rhythm well controlled on low-dose amiodarone 200 mg daily Previously concerned about hair loss on amiodarone. This has been stable  History of back surgery [place] over the summer 2017: vertebrae trauma, fell backwards She underwent kyphoplasty twice, in October November 2017 with 6 total kyphoplasty's, ranging L2-L5, S1, T6, T7. She had a very bad experience on her first surgery   hospital admission April 2017 with acute diastolic and systolic CHF, Found to have atrial fibrillation with RVR  Prior workup at [facility] March 2017 with echocardiogram at that time documenting ejection fraction 25% Right heart catheterization with normal filling pressures  Echo 09/2015: - Left ventricle: The cavity size was normal. There was mildconcentric hypertrophy. Systolic function was mildly tomoderately reduced. The estimated ejection  fraction was in the range of 40% to 45%. Wall motion was normal; there were noregional wall motion abnormalities.  - Mitral valve: A mechanical prosthesis was present. Transvalvularvelocity was within the normal range. There was no evidence forstenosis. Peak  gradient (D): 8 mm Hg. - Right ventricle: Systolic function was normal. - Tricuspid valve: There was moderate regurgitation. - Pulmonary arteries: Systolic pressure was mildly elevated. PA peak pressure: 43 mm Hg (S).   PMH:   has a past medical history of Arthritis, Chronic atrial fibrillation (HCC), Chronic back pain, Chronic combined systolic (congestive) and diastolic (congestive) heart failure (HCC), Compression fracture, GERD (gastroesophageal reflux disease), Hypertension, S/P MVR (mitral valve replacement), and Shortness of breath dyspnea.  PSH:    Past Surgical History:  Procedure Laterality Date  . BREAST LUMPECTOMY Right 2005?   benign lump excision  . CARDIAC CATHETERIZATION    . [place], 2016   "MVR; MVR"  . CATARACT EXTRACTION W/PHACO Right 02/20/2015   Procedure: CATARACT EXTRACTION PHACO AND INTRAOCULAR LENS PLACEMENT (IOC);  Surgeon: Estill Cotta, MD;  Location: [facility] ORS;  Service: Ophthalmology;  Laterality: Right;  US01:31.2 AP 25.3% CDE40.13 Fluid lot # [id number]  . ELECTROPHYSIOLOGIC STUDY N/A 10/09/2015   Procedure: CARDIOVERSION;  Surgeon: Wellington Hampshire, MD;  Location: [facility] ORS;  Service: Cardiovascular;  Laterality: N/A;  . IR GENERIC HISTORICAL  04/01/2016   IR RADIOLOGIST EVAL & MGMT 04/01/2016 [facility]  . IR GENERIC HISTORICAL  04/15/2016   IR VERTEBROPLASTY CERV/THOR BX INC UNI/BIL INC/INJECT/IMAGING 04/15/2016 Luanne Bras, MD [facility]  . IR GENERIC HISTORICAL  04/15/2016   IR VERTEBROPLASTY EA ADDL (T&LS) BX INC UNI/BIL INC INJECT/IMAGING 04/15/2016 Luanne Bras, MD [facility]  . IR GENERIC HISTORICAL  04/15/2016   IR VERTEBROPLASTY EA ADDL (T&LS) BX INC UNI/BIL INC INJECT/IMAGING 04/15/2016 Luanne Bras, MD [facility]  . IR GENERIC HISTORICAL  05/20/2016   IR RADIOLOGIST EVAL & MGMT 05/20/2016 [facility]  . IR GENERIC HISTORICAL  06/13/2016   IR VERTEBROPLASTY EA ADDL (T&LS) BX INC UNI/BIL  INC INJECT/IMAGING 06/13/2016 Luanne Bras, MD [facility]  . IR GENERIC HISTORICAL  06/13/2016   IR SACROPLASTY BILATERAL 06/13/2016 Luanne Bras, MD [facility]  . IR GENERIC HISTORICAL  06/13/2016   IR VERTEBROPLASTY LUMBAR BX INC UNI/BIL INC/INJECT/IMAGING 06/13/2016 Luanne Bras, MD [facility]  . IRRIGATION AND DEBRIDEMENT HEMATOMA Left 07/05/2015   Procedure: IRRIGATION AND DEBRIDEMENT HEMATOMA;  Surgeon: Robert Bellow, MD;  Location: [facility] ORS;  Service: General;  Laterality: Left;  . pyloric stenosis  07/1937  . US ECHOCARDIOGRAPHY      Current Outpatient Medications  Medication Sig Dispense Refill  . acetaminophen (TYLENOL) 650 MG CR tablet Take 650 mg by mouth every 8 (eight) hours as needed for pain.    Marland Kitchen amiodarone (PACERONE) 200 MG tablet Take 1 tablet (200 mg total) by mouth daily. 90 tablet 3  . aspirin EC 81 MG tablet Take 81 mg by mouth daily.     . Cholecalciferol (VITAMIN D) 2000 units tablet Take 2,000 Units by mouth daily.    Marland Kitchen enoxaparin (LOVENOX) 80 MG/0.8ML injection Inject 0.7 mLs (70 mg total) into the skin every 12 (twelve) hours. Take 5 days before surgery, Take 5 days after surgery 10 Syringe 1  . KLOR-CON 10 10 MEQ tablet Take 10 mEq by mouth daily.     Marland Kitchen losartan (COZAAR) 25 MG tablet Take 0.5 tablets (12.5 mg total) by mouth daily. 30 tablet 3  .  Menthol (ICY HOT) 5 % PTCH Apply 1 patch topically daily as needed (pain).    . Multiple Vitamin (MULTIVITAMIN WITH MINERALS) TABS tablet Take 1 tablet by mouth daily.    . polyethylene glycol (MIRALAX / GLYCOLAX) packet Take 17 g by mouth 2 (two) times daily. (Patient taking differently: Take 17 g by mouth daily as needed for mild constipation. ) 14 each 0  . Probiotic Product (RISA-BID PROBIOTIC PO) Take 1 capsule by mouth daily.     . ranitidine (ZANTAC) 150 MG tablet Take 300 mg by mouth at bedtime.    . senna-docusate (SENOKOT-S) 8.6-50 MG tablet Take 1 tablet by mouth 2 (two) times daily.  30 tablet 1  . spironolactone (ALDACTONE) 25 MG tablet Take 25 mg by mouth daily.     Marland Kitchen torsemide (DEMADEX) 20 MG tablet Take 1 tablet (20 mg total) by mouth 2 (two) times daily. Weigh Daily 180 tablet 3  . vitamin C (ASCORBIC ACID) 500 MG tablet Take 500 mg by mouth daily.    Marland Kitchen warfarin (COUMADIN) 6 MG tablet Take 3-6 mg by mouth See admin instructions. Take 3 mg once daily at night except for Wednesdays take 6 mg once nightly     No current facility-administered medications for this visit.      Allergies:   Ace inhibitors; Hydrocodone; Mercury; Silver; Cephalexin; Clindamycin/lincomycin; Nickel; and Penicillins   Social History:  The patient  reports that she has quit smoking. Her smoking use included cigarettes. She has a 27.00 pack-year smoking history. she has never used smokeless tobacco. She reports that she drinks alcohol. She reports that she does not use drugs.   Family History:   family history includes Renal Disease in her father; Stroke in her mother.    Review of Systems: Review of Systems  Constitutional: Negative.   Respiratory: Negative.   Cardiovascular: Negative.   Gastrointestinal: Negative.   Musculoskeletal: Positive for back pain.       Unsteady gait  Neurological: Negative.   Psychiatric/Behavioral: Negative.   All other systems reviewed and are negative.    PHYSICAL EXAM: VS:  BP (!) 152/77 (BP Location: Left Arm, Patient Position: Sitting, Cuff Size: Normal)   Pulse 65   Ht 5\' 4"  (1.626 m)   Wt 148 lb 4 oz (67.2 kg)   BMI 25.45 kg/m  , BMI Body mass index is 25.45 kg/m. Constitutional:  oriented to person, place, and time. No distress.  Significant kyphosis, walking with a walker  HENT:  Head: Normocephalic and atraumatic.  Eyes:  no discharge. No scleral icterus.  Neck: Normal range of motion. Neck supple. No JVD present.  Cardiovascular: Normal rate, regular rhythm, normal heart sounds and intact distal pulses. Exam reveals no gallop and no  friction rub.  Trace to 1+ edema to below the knees bilaterally, compression hose in place No murmur heard. Pulmonary/Chest: Effort normal and breath sounds normal. No stridor. No respiratory distress.  no wheezes.  no rales.  no tenderness.  Abdominal: Soft.  no distension.  no tenderness.  Musculoskeletal: Normal range of motion.  no  tenderness or deformity.  Neurological:  normal muscle tone. Coordination normal. No atrophy Skin: Skin is warm and dry. No rash noted. not diaphoretic.  Psychiatric:  normal mood and affect. behavior is normal. Thought content normal.      Recent Labs: 09/01/2017: BUN 50; Creatinine, Ser 1.41; Potassium 4.7; Sodium 137    Lipid Panel No results found for: CHOL, HDL, LDLCALC, TRIG  Wt Readings from Last 3 Encounters:  09/09/17 148 lb 4 oz (67.2 kg)  08/29/17 147 lb 8 oz (66.9 kg)  02/26/17 138 lb 8 oz (62.8 kg)       ASSESSMENT AND PLAN:  Paroxysmal atrial fibrillation ([place]) - Plan: EKG 12-Lead Appears to be maintaining normal sinus rhythm Continue  amiodarone 200 mg daily given history of paroxysmal atrial fibrillation and flutter Long discussion concerning anticoagulation, goal INR 2.5 or higher  Typical atrial flutter ([place]) - Plan: EKG 12-Lead Normal sinus rhythm restored. Previously noted on EKG  in 6808  Systolic and diastolic CHF, acute on chronic (HCC) Weight up 10 pounds from prior clinic visit We will change to torsemide 20 twice daily Daughter will weigh daily,  Will likely need to cut back on torsemide dosing once weight gets down to 140 or less She has high fluid intake, we have discussed this with her in detail We did talk about changing her losartan to [facility].  She was reluctant to change medications at this time especially to a non-generic We will discuss with her again in follow-up  Mixed hyperlipidemia Currently not on a statin Monitored by primary care  H/O mitral valve replacement with mechanical  valve Echocardiogram 2019 with well functioning mitral valve We will take over warfarin management given subtherapeutic for 3 months High risk of stroke Imperative she have INR 2.5 to 3.5 We will check INR today, INR is 2.3 today  Chronic back pain Very deconditioned, not exercising walking for the past several months Uncertain if she is scared to ambulate outside the house Being in the house more has been drinking more fluids causing 10 pound weight gain Recommended we try to get physical therapy involved She has indicated she would like to delay her back surgery until CHF symptoms have improved Likely will need to get her down 10 pounds Once she is ready she will need Lovenox bridge to surgery before and after given mechanical valve   Total encounter time more than 45 minutes  Greater than 50% was spent in counseling and coordination of care with the patient  Disposition:   F/U  12 months   Orders Placed This Encounter  Procedures  . Basic metabolic panel  . Ambulatory referral to Home Health     Signed, Esmond Plants, M.D., Ph.D. 09/09/2017  [place], [place]

## 2017-09-07 NOTE — Telephone Encounter (Signed)
I have called the daughter,[name], to the number listed in the chart (10:40 AM) Left a message Recommended  lovenox 70 mg Sq BID (based on a weight of 67 KG 5 days prior to back surgery and 5 days after surgery Recommended that she start today, Sunday if possible lovenox called to [facility]  Signed, Esmond Plants, MD, Ph.D [facility]

## 2017-09-07 NOTE — Telephone Encounter (Signed)
On review of her chart,  She has a mechanical mitral valve in addition to atrial fibrillaiton Warfarin managed by PMD  INR appears to have been subtherapeutic for the past few months Goal INR >2.5  Would recommend lovenox  bridge prior to and after surgery, Would recommend our clinic manage INR after surgery is complete  She has an appt in our clinic on Tuesday 09/09/2017  Signed, Esmond Plants, MD, Ph.D [facility]

## 2017-09-08 ENCOUNTER — Telehealth: Payer: Self-pay

## 2017-09-08 ENCOUNTER — Inpatient Hospital Stay: Admission: RE | Admit: 2017-09-08 | Payer: Medicare Other | Source: Ambulatory Visit

## 2017-09-08 NOTE — Telephone Encounter (Signed)
Please see other telephone note

## 2017-09-08 NOTE — Telephone Encounter (Signed)
Patient daughter [name] returning call  °

## 2017-09-08 NOTE — Telephone Encounter (Signed)
Left voicemail message to call back  

## 2017-09-08 NOTE — Telephone Encounter (Signed)
Spoke with patients daughter per release form and she states that they have appointment to come in tomorrow to see Dr. Rockey Situ. She states that they are rescheduling her procedure for a later date due to confusion regarding medication instructions. Advised that I would make Dr. Rockey Situ aware and he can discuss in more detail tomorrow. She was appreciative for the call with no further questions at this time.

## 2017-09-08 NOTE — Telephone Encounter (Signed)
I can see her anytime after her surgery, as per Thurmond Butts: "During the perioperative timeframe, I defer all management of patient's Coumadin and Lovenox to PCP as they are managing this at this time. Patient and family are aware of increased stroke risk with subtherapeutic INR and during bridging as above."  She is already on coumadin, so she does not need a new coumadin appt. Her surgery is sched for 3/14, so I can see her any Mon or Wed after that.  You can certainly see if she would like to go ahead and schedule this while she is here for appt tomorrow.

## 2017-09-08 NOTE — Telephone Encounter (Signed)
Spoke with patient's daughter and she said that they have an appt with Dr. Rockey Situ 3/12 and they will discuss the Lovenox administration

## 2017-09-08 NOTE — Telephone Encounter (Signed)
[name] dtr Rayne Du- rtn call to [place] call

## 2017-09-08 NOTE — Telephone Encounter (Signed)
Left daughter message to call back.[name]

## 2017-09-09 ENCOUNTER — Encounter: Payer: Self-pay | Admitting: Cardiovascular Disease

## 2017-09-09 ENCOUNTER — Ambulatory Visit (INDEPENDENT_AMBULATORY_CARE_PROVIDER_SITE_OTHER): Payer: Medicare Other | Admitting: Pharmacist

## 2017-09-09 ENCOUNTER — Ambulatory Visit (INDEPENDENT_AMBULATORY_CARE_PROVIDER_SITE_OTHER): Payer: Medicare Other | Admitting: Cardiovascular Disease

## 2017-09-09 VITALS — BP 152/77 | HR 65 | Ht 64.0 in | Wt 148.2 lb

## 2017-09-09 DIAGNOSIS — Z952 Presence of prosthetic heart valve: Secondary | ICD-10-CM

## 2017-09-09 DIAGNOSIS — N183 Chronic kidney disease, stage 3 unspecified: Secondary | ICD-10-CM

## 2017-09-09 DIAGNOSIS — I5043 Acute on chronic combined systolic (congestive) and diastolic (congestive) heart failure: Secondary | ICD-10-CM

## 2017-09-09 DIAGNOSIS — I480 Paroxysmal atrial fibrillation: Secondary | ICD-10-CM

## 2017-09-09 DIAGNOSIS — I5042 Chronic combined systolic (congestive) and diastolic (congestive) heart failure: Secondary | ICD-10-CM

## 2017-09-09 DIAGNOSIS — Z5181 Encounter for therapeutic drug level monitoring: Secondary | ICD-10-CM | POA: Insufficient documentation

## 2017-09-09 DIAGNOSIS — M5440 Lumbago with sciatica, unspecified side: Secondary | ICD-10-CM | POA: Diagnosis not present

## 2017-09-09 DIAGNOSIS — Z7189 Other specified counseling: Secondary | ICD-10-CM | POA: Diagnosis not present

## 2017-09-09 DIAGNOSIS — R0602 Shortness of breath: Secondary | ICD-10-CM | POA: Diagnosis not present

## 2017-09-09 LAB — POCT INR: INR: 2.3

## 2017-09-09 MED ORDER — TORSEMIDE 20 MG PO TABS: 20.0000 mg | ORAL_TABLET | Freq: Two times a day (BID) | ORAL | 3 refills | Status: DC

## 2017-09-09 NOTE — Patient Instructions (Addendum)
[facility]   Medication Instructions:  Your physician has recommended you make the following change in your medication:  1. STOP Furosemide (Lasix) 2. START Torsemide 20 mg twice a day  Your physician recommends that you weigh, daily, at the same time every day, and in the same amount of clothing. Please record your daily weights on the handout provided and bring it to your next appointment.  Please call when weight gets into low 140's Watch fluid intake and weigh daily   Labwork:  When weight is down we will check BMP  Testing/Procedures:  No further testing at this time   Follow-Up: It was a pleasure seeing you in the office today. Please call us if you have new issues that need to be addressed before your next appt.  [phone]  Your physician wants you to follow-up in: 1 month   If you need a refill on your cardiac medications before your next appointment, please call your pharmacy.  For educational health videos Log in to : [email or web address] Or : [email or web address], password : [email or web address]

## 2017-09-09 NOTE — Patient Instructions (Signed)
Description   Today take 6mg  (1 tablet) then change dose to 3mg  (1/2 tablet) daily, except 6mg  on Wednesdays and Saturdays. Recheck INR in 2 weeks.

## 2017-09-10 ENCOUNTER — Other Ambulatory Visit: Payer: Medicare Other

## 2017-09-10 ENCOUNTER — Ambulatory Visit: Payer: Medicare Other

## 2017-09-11 ENCOUNTER — Telehealth: Payer: Self-pay | Admitting: Cardiovascular Disease

## 2017-09-11 NOTE — Telephone Encounter (Signed)
I spoke with [name] (dpr on file), she feels torsemide 20 mg BID is not working fast enough. She states patient is still sob and weight is down only 2 lbs from yesterday. I advised her to have patient continue taking current dose as oral diuretics do not work as fast, monitor weight and I will forward to Dr. Rockey Situ for further recommendations, she verbalized understanding and thankful for the call.

## 2017-09-11 NOTE — Telephone Encounter (Signed)
Pt daughter calling asking for a call back about new medication we placed patient on  Pt c/o medication issue:  1. Name of Medication: Torsemide   2. How are you currently taking this medication (dosage and times per day)? 20 mg 2x a day   3. Are you having a reaction (difficulty breathing--STAT)? Just having SOB   4. What is your medication issue? Not helping with fluid, it seems like patient is retaining more fluid.   Please advise

## 2017-09-12 NOTE — Telephone Encounter (Signed)
Spoke with patients daughter and she reports that her mother is still short of breath and with swelling. Recommended that she have her continue current medications over the weekend and to call me back if her symptoms do not improve. She verbalized understanding with no further questions at this time.

## 2017-09-12 NOTE — Telephone Encounter (Signed)
Spoke with patients daughter and instructed her to increase torsemide to 2 tablets in the AM and PM with extra potassium pill as well. She verbalized understanding of our conversation, instructions for medication change and had no further questions at this time.

## 2017-09-12 NOTE — Telephone Encounter (Signed)
Would try torsemide 40 twice daily through the weekend and call us on Monday

## 2017-09-12 NOTE — Telephone Encounter (Signed)
Patient daughter [name] calling Would like to speak with Pam before the end of the day today Denied further details  Please call

## 2017-09-15 ENCOUNTER — Other Ambulatory Visit: Payer: Medicare Other

## 2017-09-16 ENCOUNTER — Telehealth: Payer: Self-pay | Admitting: Cardiovascular Disease

## 2017-09-16 DIAGNOSIS — I130 Hypertensive heart and chronic kidney disease with heart failure and stage 1 through stage 4 chronic kidney disease, or unspecified chronic kidney disease: Secondary | ICD-10-CM

## 2017-09-16 NOTE — Telephone Encounter (Signed)
Spoke with [place] with [facility] and confirmed plan of care with 2 times a week for 4 weeks. Also discussed patients oxygen saturations and that she may want to check with patients primary care physician for further instruction. She verbalized understanding with no further questions at this time.

## 2017-09-16 NOTE — Telephone Encounter (Signed)
We will check with family and see if weight is coming down and fluid is coming off If still weight running high and edema with shortness of breath may need to continue on torsemide 40 twice daily for 3 more days before going back to 20 twice daily

## 2017-09-16 NOTE — Telephone Encounter (Signed)
[facility] Joya Gaskins Physical therapist calling giving an update on patient.  She had some concerns, patient did a 20 foot walk and O2 stat with down to 87 on room air She did a 2 minute walk in house, and O2 stat dropped to 79% on room air.  She was SOB   She is calling needing a verbal on Plan on care, she would like to go our 2 times a week for the next 4 weeks  Please advise

## 2017-09-17 NOTE — Telephone Encounter (Signed)
Spoke with patients daughter per release form and reviewed Dr. Donivan Scull recommendations. She states that her mother is down 4 pounds at this time and they are seeing Dr. Sabra Heck for the oxygen needs as well. She inquired about repeat labs and I do see that Dr. Rockey Situ wanted to recheck BMP when her weight comes down. She is coming in next week for coumadin check and will see if we can have her repeat labs at that time as well. She was appreciative for the call with no further questions at this time.

## 2017-09-18 ENCOUNTER — Encounter: Admission: RE | Payer: Self-pay | Source: Ambulatory Visit

## 2017-09-18 ENCOUNTER — Ambulatory Visit: Admission: RE | Admit: 2017-09-18 | Payer: Medicare Other | Source: Ambulatory Visit | Admitting: Orthopedic Surgery

## 2017-09-18 SURGERY — KYPHOPLASTY
Anesthesia: Choice

## 2017-09-22 ENCOUNTER — Ambulatory Visit (INDEPENDENT_AMBULATORY_CARE_PROVIDER_SITE_OTHER): Payer: Medicare Other

## 2017-09-22 ENCOUNTER — Other Ambulatory Visit (INDEPENDENT_AMBULATORY_CARE_PROVIDER_SITE_OTHER): Payer: Medicare Other

## 2017-09-22 DIAGNOSIS — Z5181 Encounter for therapeutic drug level monitoring: Secondary | ICD-10-CM

## 2017-09-22 DIAGNOSIS — I480 Paroxysmal atrial fibrillation: Secondary | ICD-10-CM

## 2017-09-22 DIAGNOSIS — Z952 Presence of prosthetic heart valve: Secondary | ICD-10-CM | POA: Diagnosis not present

## 2017-09-22 LAB — POCT INR: INR: 3

## 2017-09-22 NOTE — Patient Instructions (Signed)
Please continue dosage of 3mg  (1/2 tablet) daily, except 6mg  on Wednesdays and Saturdays.  Recheck INR in 2 weeks.

## 2017-09-23 LAB — BASIC METABOLIC PANEL
BUN / CREAT RATIO: 24 (ref 12–28)
BUN: 43 mg/dL — AB (ref 8–27)
CHLORIDE: 96 mmol/L (ref 96–106)
CO2: 26 mmol/L (ref 20–29)
Calcium: 9.7 mg/dL (ref 8.7–10.3)
Creatinine, Ser: 1.78 mg/dL — ABNORMAL HIGH (ref 0.57–1.00)
GFR calc Af Amer: 31 mL/min/{1.73_m2} — ABNORMAL LOW (ref >59)
GFR calc non Af Amer: 27 mL/min/{1.73_m2} — ABNORMAL LOW (ref >59)
GLUCOSE: 72 mg/dL (ref 65–99)
Potassium: 5 mmol/L (ref 3.5–5.2)
Sodium: 140 mmol/L (ref 134–144)

## 2017-09-24 ENCOUNTER — Telehealth: Payer: Self-pay | Admitting: *Deleted

## 2017-09-24 NOTE — Telephone Encounter (Signed)
Spoke with [name] patients daughter per release form and reviewed test results and recommendations. Confirmed Torsemide dosage and frequency with her as well. She reports that patient was taking Torsemide 20 mg 1 tablet twice a day but that she had taken 2 tablets twice a day for a total of 6 days with a week in between. Instructed her to make sure she does not take more than 1 tablet twice a day between now and her upcoming appointment on Monday. Patients daughter states the patient responds better to the Lasix and Metolazone which they really did feel had better results. Recommended that she discuss this with Dr. Rockey Situ at upcoming appointment on Monday and that I would let him know we spoke about her medications. She was very appreciative for the call, verbalized understanding of our conversation, agreement with plan, and had no further questions at this time.

## 2017-09-24 NOTE — Telephone Encounter (Signed)
-----   Message from Minna Merritts, MD sent at 09/23/2017  7:27 PM EDT ----- Kidney function getting worse likely from too much torsemide Creatinine 1.4 now up to 1.8 Would cut back on the torsemide Can we confirm her torsemide dosing Residual leg swelling will need to be treated with compression hose leg elevation

## 2017-09-27 NOTE — Progress Notes (Addendum)
Cardiology Office Note  Date:  09/29/2017   ID:  [name], DOB 1934/10/26, MRN [id number]  PCP:  Rusty Aus, MD   Chief Complaint  Patient presents with  . Other    1 month follow up from ECHO. Patient c/o SOB. Meds reviewed verbally with patient.     HPI:  [name] a pleasant 83 year old woman with history of  paroxysmal atrial fibrillation,  prior cardioversion April 2017 while on amiodarone,  mitral valve replacement with mechanical valve,  redo 2016  on chronic anticoagulation,  Ejection fraction 40-45% April 2017 chronic renal insufficiency,  vertebral fractures with kyphoplasty,  lymphedema who uses lymphedema compression pumps  On warfarin, managed through our office who presents for follow-up of her paroxysmal atrial fibrillation  Weight at home 146 dressed This is down 2 pounds from her last clinic visit but up from her prior office visits 138 pounds Still has abdominal bloating, leg edema  kyphoplasty previously scheduled on 09/04/17 was delayed for fluid retention, shortness of breath  High fluid intake soda, coffee, water, tea Daughter feels she was doing better on Lasix 60 daily (20 mg 3 times daily) Not as good on the torsemide Had to climb in creatinine up to 1.8 on torsemide 40 twice daily last week Dose of torsemide decreased back to 20 twice a day  Currently lives with her daughter who does all of her medications Limited by chronic back pain, worse recently  EKG personally reviewed by myself on todays visit Shows sinus rhythm with rate 59 bpm intraventricular conduction delay, left anterior fascicular block  Echocardiogram March 2019 Left ventricle: The cavity size was normal. There was mild concentric hypertrophy. Systolic function was moderately reduced. The estimated ejection fraction was in the range of 35% to 40%. (Previous ejection fraction in April 2017 was 40-45%) - Aortic valve: There was mild to moderate stenosis. Mean  gradient   (S): 10 mm Hg. Valve area (VTI): 1.17 cm^2. - Mitral valve: A mechanical prosthesis was present and functioning   normally. Mean gradient (D): 3 mm Hg. - Left atrium: The atrium was mildly dilated. - Pulmonary arteries: Systolic pressure was mildly increased. PA   peak pressure: 40 mm Hg (S).  Other past medical history reviewed Reports heart rhythm well controlled on low-dose amiodarone 200 mg daily Previously concerned about hair loss on amiodarone. This has been stable  History of back surgery [place] over the summer 2017: vertebrae trauma, fell backwards She underwent kyphoplasty twice, in October November 2017 with 6 total kyphoplasty's, ranging L2-L5, S1, T6, T7. She had a very bad experience on her first surgery   hospital admission April 2017 with acute diastolic and systolic CHF, Found to have atrial fibrillation with RVR  Prior workup at [facility] March 2017 with echocardiogram at that time documenting ejection fraction 25% Right heart catheterization with normal filling pressures  Echo 09/2015: - Left ventricle: The cavity size was normal. There was mildconcentric hypertrophy. Systolic function was mildly tomoderately reduced. The estimated ejection fraction was in the range of 40% to 45%. Wall motion was normal; there were noregional wall motion abnormalities.  - Mitral valve: A mechanical prosthesis was present. Transvalvularvelocity was within the normal range. There was no evidence forstenosis. Peak gradient (D): 8 mm Hg. - Right ventricle: Systolic function was normal. - Tricuspid valve: There was moderate regurgitation. - Pulmonary arteries: Systolic pressure was mildly elevated. PA peak pressure: 43 mm Hg (S).   PMH:   has a past medical history  of Arthritis, Chronic atrial fibrillation (HCC), Chronic back pain, Chronic combined systolic (congestive) and diastolic (congestive) heart failure (HCC), Compression fracture, GERD (gastroesophageal  reflux disease), Hypertension, S/P MVR (mitral valve replacement), and Shortness of breath dyspnea.  PSH:    Past Surgical History:  Procedure Laterality Date  . BREAST LUMPECTOMY Right 2005?   benign lump excision  . CARDIAC CATHETERIZATION    . [place], 2016   "MVR; MVR"  . CATARACT EXTRACTION W/PHACO Right 02/20/2015   Procedure: CATARACT EXTRACTION PHACO AND INTRAOCULAR LENS PLACEMENT (IOC);  Surgeon: Estill Cotta, MD;  Location: [facility] ORS;  Service: Ophthalmology;  Laterality: Right;  US01:31.2 AP 25.3% CDE40.13 Fluid lot # [id number]  . ELECTROPHYSIOLOGIC STUDY N/A 10/09/2015   Procedure: CARDIOVERSION;  Surgeon: Wellington Hampshire, MD;  Location: [facility] ORS;  Service: Cardiovascular;  Laterality: N/A;  . IR GENERIC HISTORICAL  04/01/2016   IR RADIOLOGIST EVAL & MGMT 04/01/2016 [facility]  . IR GENERIC HISTORICAL  04/15/2016   IR VERTEBROPLASTY CERV/THOR BX INC UNI/BIL INC/INJECT/IMAGING 04/15/2016 Luanne Bras, MD [facility]  . IR GENERIC HISTORICAL  04/15/2016   IR VERTEBROPLASTY EA ADDL (T&LS) BX INC UNI/BIL INC INJECT/IMAGING 04/15/2016 Luanne Bras, MD [facility]  . IR GENERIC HISTORICAL  04/15/2016   IR VERTEBROPLASTY EA ADDL (T&LS) BX INC UNI/BIL INC INJECT/IMAGING 04/15/2016 Luanne Bras, MD [facility]  . IR GENERIC HISTORICAL  05/20/2016   IR RADIOLOGIST EVAL & MGMT 05/20/2016 [facility]  . IR GENERIC HISTORICAL  06/13/2016   IR VERTEBROPLASTY EA ADDL (T&LS) BX INC UNI/BIL INC INJECT/IMAGING 06/13/2016 Luanne Bras, MD [facility]  . IR GENERIC HISTORICAL  06/13/2016   IR SACROPLASTY BILATERAL 06/13/2016 Luanne Bras, MD [facility]  . IR GENERIC HISTORICAL  06/13/2016   IR VERTEBROPLASTY LUMBAR BX INC UNI/BIL INC/INJECT/IMAGING 06/13/2016 Luanne Bras, MD [facility]  . IRRIGATION AND DEBRIDEMENT HEMATOMA Left 07/05/2015   Procedure: IRRIGATION AND DEBRIDEMENT HEMATOMA;  Surgeon: Robert Bellow,  MD;  Location: [facility] ORS;  Service: General;  Laterality: Left;  . pyloric stenosis  07/1937  . US ECHOCARDIOGRAPHY      Current Outpatient Medications  Medication Sig Dispense Refill  . acetaminophen (TYLENOL) 650 MG CR tablet Take 650 mg by mouth every 8 (eight) hours as needed for pain.    Marland Kitchen amiodarone (PACERONE) 200 MG tablet Take 1 tablet (200 mg total) by mouth daily. 90 tablet 3  . aspirin EC 81 MG tablet Take 81 mg by mouth daily.     . Cholecalciferol (VITAMIN D) 2000 units tablet Take 2,000 Units by mouth daily.    Marland Kitchen enoxaparin (LOVENOX) 80 MG/0.8ML injection Inject 0.7 mLs (70 mg total) into the skin every 12 (twelve) hours. Take 5 days before surgery, Take 5 days after surgery 10 Syringe 1  . KLOR-CON 10 10 MEQ tablet Take 10 mEq by mouth daily.     Marland Kitchen losartan (COZAAR) 25 MG tablet Take 0.5 tablets (12.5 mg total) by mouth daily. 30 tablet 3  . Menthol (ICY HOT) 5 % PTCH Apply 1 patch topically daily as needed (pain).    . Multiple Vitamin (MULTIVITAMIN WITH MINERALS) TABS tablet Take 1 tablet by mouth daily.    . polyethylene glycol (MIRALAX / GLYCOLAX) packet Take 17 g by mouth 2 (two) times daily. (Patient taking differently: Take 17 g by mouth daily as needed for mild constipation. ) 14 each 0  . Probiotic Product (RISA-BID PROBIOTIC PO) Take 1 capsule by mouth daily.     Marland Kitchen  ranitidine (ZANTAC) 150 MG tablet Take 300 mg by mouth at bedtime.    . senna-docusate (SENOKOT-S) 8.6-50 MG tablet Take 1 tablet by mouth 2 (two) times daily. 30 tablet 1  . spironolactone (ALDACTONE) 25 MG tablet Take 25 mg by mouth daily.     . vitamin C (ASCORBIC ACID) 500 MG tablet Take 500 mg by mouth daily.    Marland Kitchen warfarin (COUMADIN) 6 MG tablet Take 3-6 mg by mouth See admin instructions. Take 3 mg once daily at night except for Wednesdays take 6 mg once nightly    . furosemide (LASIX) 20 MG tablet Take 1 tablet (20 mg total) by mouth 3 (three) times daily. 270 tablet 3  . metolazone (ZAROXOLYN) 5 MG  tablet Take 1 tablet (5 mg total) by mouth as needed (As needed for weight greater than 148). 90 tablet 3   No current facility-administered medications for this visit.      Allergies:   Ace inhibitors; Hydrocodone; Mercury; Silver; Cephalexin; Clindamycin/lincomycin; Nickel; and Penicillins   Social History:  The patient  reports that she has quit smoking. Her smoking use included cigarettes. She has a 27.00 pack-year smoking history. She has never used smokeless tobacco. She reports that she drank alcohol. She reports that she does not use drugs.   Family History:   family history includes Renal Disease in her father; Stroke in her mother.    Review of Systems: Review of Systems  Constitutional: Negative.   Respiratory: Negative.   Cardiovascular: Negative.   Gastrointestinal: Negative.   Musculoskeletal: Positive for back pain.       Unsteady gait  Neurological: Negative.   Psychiatric/Behavioral: Negative.   All other systems reviewed and are negative.    PHYSICAL EXAM: VS:  BP 116/60 (BP Location: Left Arm, Patient Position: Sitting, Cuff Size: Normal)   Pulse (!) 59   Ht 4\' 11"  (1.499 m)   Wt 148 lb (67.1 kg)   BMI 29.89 kg/m  , BMI Body mass index is 29.89 kg/m. Constitutional:  oriented to person, place, and time. No distress.  Significant kyphosis, walking with a walker  HENT:  Head: Normocephalic and atraumatic.  Eyes:  no discharge. No scleral icterus.  Neck: Normal range of motion. Neck supple. No JVD present.  Cardiovascular: Normal rate, regular rhythm, normal heart sounds and intact distal pulses. Exam reveals no gallop and no friction rub.  Trace  edema to below the knees bilaterally, compression hose in place No murmur heard. Pulmonary/Chest: Effort normal and breath sounds normal. No stridor. No respiratory distress.  no wheezes.  no rales.  no tenderness.  Abdominal: Soft.  no distension.  no tenderness.  Musculoskeletal: Normal range of motion.  no   tenderness or deformity.  Neurological:  normal muscle tone. Coordination normal. No atrophy Skin: Skin is warm and dry. No rash noted. not diaphoretic.  Psychiatric:  normal mood and affect. behavior is normal. Thought content normal.      Recent Labs: 09/22/2017: BUN 43; Creatinine, Ser 1.78; Potassium 5.0; Sodium 140    Lipid Panel No results found for: CHOL, HDL, LDLCALC, TRIG    Wt Readings from Last 3 Encounters:  09/29/17 148 lb (67.1 kg)  09/09/17 148 lb 4 oz (67.2 kg)  08/29/17 147 lb 8 oz (66.9 kg)       ASSESSMENT AND PLAN:  Paroxysmal atrial fibrillation (HCC) - Plan: EKG 12-Lead maintaining normal sinus rhythm Continue  amiodarone 200 mg daily history of paroxysmal atrial fibrillation and flutter  goal INR 2.5 or higher Last INR 3.01-week ago  Typical atrial flutter ([place]) - Plan: EKG 12-Lead Normal sinus rhythm restored. Previously noted on EKG  in 2017 Stable on today's visit  Systolic and diastolic CHF, acute on chronic (HCC) Baseline weight likely  138 pounds range Currently 146 pounds Higher creatinine on torsemide 40 twice daily with not much weight loss High fluid intake Did not reach 140 pounds as we had hoped on the torsemide At the decrease the dose back to 20 twice daily for creatinine close to 1.81 baseline is 1.4 less Daughter requesting we go back to Lasix 20 3 times daily with occasional metolazone use Previously discussed changing her losartan to [employer].  She was reluctant to change medications at this time especially to a non-generic Instructions provided as below: Hold the torsemide Restart lasix 20 mg three times a day Take metolazone 2.5 mg as needed for weight > than 148 pounds For home weight >146, take lasix 4 of the 20 mg pills through the day We will request in-home IV Lasix protocol, with use of IV Lasix for weight 149 pounds or if she has worsening symptoms leg swelling abdominal bloating shortness of breath  Mixed  hyperlipidemia Currently not on a statin Monitored by primary care Will defer to their office  H/O mitral valve replacement with mechanical valve Echocardiogram 2019 with well functioning mitral valve We will take over warfarin management given subtherapeutic for 3 months INR is 3.0 Stable for now  Chronic back pain Very deconditioned, not exercising walking for the past several months Severe kyphosis, patient requesting to have back surgery She would be acceptable but moderate risk for surgery,  Would recommend minimal use of IV fluids given chronic diastolic CHF Once she is ready she will need Lovenox bridge to surgery before and after given mechanical valve   Total encounter time more than 45 minutes  Greater than 50% was spent in counseling and coordination of care with the patient  Disposition:   F/U  3 months   Orders Placed This Encounter  Procedures  . Basic metabolic panel  . AMB referral to CHF clinic  . EKG 12-Lead     Signed, Esmond Plants, M.D., Ph.D. 09/29/2017  [place], [place]

## 2017-09-29 ENCOUNTER — Encounter: Payer: Self-pay | Admitting: Cardiovascular Disease

## 2017-09-29 ENCOUNTER — Telehealth: Payer: Self-pay | Admitting: Cardiovascular Disease

## 2017-09-29 ENCOUNTER — Ambulatory Visit (INDEPENDENT_AMBULATORY_CARE_PROVIDER_SITE_OTHER): Payer: Medicare Other | Admitting: Cardiovascular Disease

## 2017-09-29 VITALS — BP 116/60 | HR 59 | Ht 59.0 in | Wt 148.0 lb

## 2017-09-29 DIAGNOSIS — N183 Chronic kidney disease, stage 3 unspecified: Secondary | ICD-10-CM

## 2017-09-29 DIAGNOSIS — I480 Paroxysmal atrial fibrillation: Secondary | ICD-10-CM

## 2017-09-29 DIAGNOSIS — M4000 Postural kyphosis, site unspecified: Secondary | ICD-10-CM

## 2017-09-29 DIAGNOSIS — I5043 Acute on chronic combined systolic (congestive) and diastolic (congestive) heart failure: Secondary | ICD-10-CM

## 2017-09-29 DIAGNOSIS — R0602 Shortness of breath: Secondary | ICD-10-CM | POA: Diagnosis not present

## 2017-09-29 DIAGNOSIS — Z952 Presence of prosthetic heart valve: Secondary | ICD-10-CM

## 2017-09-29 MED ORDER — FUROSEMIDE 20 MG PO TABS: 20.0000 mg | ORAL_TABLET | Freq: Three times a day (TID) | ORAL | 3 refills | Status: DC

## 2017-09-29 MED ORDER — METOLAZONE 5 MG PO TABS: 5.0000 mg | ORAL_TABLET | ORAL | 3 refills | Status: DC | PRN

## 2017-09-29 NOTE — Telephone Encounter (Signed)
[facility]oke with [facility] and currently patient is only getting PT services and they will need to send out skilled nursing in order to have the HF titration protocols. Provided verbal order by Dr. Rockey Situ for skilled nursing and will fax the Lasix protocol order over to number provided. She verbalized understanding of our conversation, agreement with plan, and had no further questions at this time.

## 2017-09-29 NOTE — Telephone Encounter (Signed)
[name] with [facility] calling stating we sent in orders for home health.  She is calling stating the orders we sent in, they need to have a nurse there with patient Thus needing a new order so nurse can go to patient   Please advise

## 2017-09-29 NOTE — Patient Instructions (Addendum)
Medication Instructions:   Hold the torsemide Restart lasix 20 mg three times a day Take metolazone 2.5 mg as needed for weight > than 148  Monitor weight daily For home weight >146, take lasix 4 of the 20 mg pills through the day  Labwork:  BMP today for acute on chronic renal insufficiency  Testing/Procedures:  No further testing at this time   Follow-Up: It was a pleasure seeing you in the office today. Please call us if you have new issues that need to be addressed before your next appt.  [phone]  Your physician wants you to follow-up in: 3 months.  You have been referred to CHF Clinic upstairs on floor 2.   You will receive a reminder letter in the mail two months in advance. If you don't receive a letter, please call our office to schedule the follow-up appointment.  If you need a refill on your cardiac medications before your next appointment, please call your pharmacy.  For educational health videos Log in to : [email or web address] Or : [email or web address], password : [email or web address]

## 2017-09-30 ENCOUNTER — Telehealth: Payer: Self-pay | Admitting: Cardiovascular Disease

## 2017-09-30 ENCOUNTER — Other Ambulatory Visit: Payer: Self-pay | Admitting: Cardiovascular Disease

## 2017-09-30 DIAGNOSIS — I483 Typical atrial flutter: Secondary | ICD-10-CM

## 2017-09-30 DIAGNOSIS — I5043 Acute on chronic combined systolic (congestive) and diastolic (congestive) heart failure: Secondary | ICD-10-CM

## 2017-09-30 LAB — BASIC METABOLIC PANEL
BUN/Creatinine Ratio: 30 — ABNORMAL HIGH (ref 12–28)
BUN: 55 mg/dL — ABNORMAL HIGH (ref 8–27)
CHLORIDE: 96 mmol/L (ref 96–106)
CO2: 19 mmol/L — ABNORMAL LOW (ref 20–29)
Calcium: 9.8 mg/dL (ref 8.7–10.3)
Creatinine, Ser: 1.82 mg/dL — ABNORMAL HIGH (ref 0.57–1.00)
GFR calc Af Amer: 30 mL/min/{1.73_m2} — ABNORMAL LOW (ref >59)
GFR calc non Af Amer: 26 mL/min/{1.73_m2} — ABNORMAL LOW (ref >59)
GLUCOSE: 76 mg/dL (ref 65–99)
POTASSIUM: 5.5 mmol/L — AB (ref 3.5–5.2)
SODIUM: 139 mmol/L (ref 134–144)

## 2017-09-30 NOTE — Telephone Encounter (Signed)
Spoke with patients daughter per release form and reviewed that labs are still pending Dr. Donivan Scull review. She reports patients weight is up and she is going to take a metolazone. Reviewed that when he reviews labs and recommendations then we would give her a call back with that information. She was appreciative for the call and had no further questions at this time.

## 2017-09-30 NOTE — Telephone Encounter (Signed)
Daughter [name] calling for patient  Patients weight is up today at 149 Did take in more fluid yesterday and did not have a significant output through the night [name] would like a call from [facility] whenever the results for kidney function labs come in to discuss

## 2017-10-03 NOTE — Telephone Encounter (Signed)
No answer. Left message to call back.   

## 2017-10-03 NOTE — Telephone Encounter (Signed)
Lab work on last clinic visit renal function was higher We did change torsemide to Lasix at request of family with occasional metolazone Probably needs to have lab work rechecked in 2 weeks time Would continue to monitor weight

## 2017-10-06 ENCOUNTER — Ambulatory Visit: Payer: Medicare Other | Admitting: Family

## 2017-10-06 NOTE — Telephone Encounter (Signed)
Left voicemail message to call back  

## 2017-10-10 NOTE — Telephone Encounter (Signed)
Spoke with patients daughter per release form. Reviewed that Dr. Rockey Situ would like her to have some repeat labs to be done. Order entered for her to have it done over at [facility]. She verbalized understanding and had no further questions at this time.

## 2017-10-15 ENCOUNTER — Telehealth: Payer: Self-pay | Admitting: Cardiovascular Disease

## 2017-10-15 ENCOUNTER — Ambulatory Visit (INDEPENDENT_AMBULATORY_CARE_PROVIDER_SITE_OTHER): Payer: Medicare Other

## 2017-10-15 DIAGNOSIS — Z5181 Encounter for therapeutic drug level monitoring: Secondary | ICD-10-CM

## 2017-10-15 DIAGNOSIS — Z952 Presence of prosthetic heart valve: Secondary | ICD-10-CM | POA: Diagnosis not present

## 2017-10-15 DIAGNOSIS — I480 Paroxysmal atrial fibrillation: Secondary | ICD-10-CM | POA: Diagnosis not present

## 2017-10-15 LAB — POCT INR: INR: 2.7

## 2017-10-15 NOTE — Patient Instructions (Signed)
Please continue dosage of 3mg  (1/2 tablet) daily, except 6mg  on Wednesdays and Saturdays.  Recheck INR in 3 weeks.

## 2017-10-15 NOTE — Telephone Encounter (Signed)
I do not see creatinine 2.93 but I do see creatinine 2.3 This is within her range Does not need to be checked every week or every other week Can likely check it mid May Number slowly trending up over the year

## 2017-10-15 NOTE — Telephone Encounter (Addendum)
Patient here today with her daughter getting her INR checked and she wanted to check on the need for repeat labs. They report she had labs done on Monday. Labs reviewed in care everywhere and BUN 49 and creat 2.3. Will route message to Dr. Rockey Situ for his review of recent labs.

## 2017-10-15 NOTE — Telephone Encounter (Signed)
Reviewed Dr. Donivan Scull recommendations to have recheck in May. She verbalized understanding with no further questions at this time.

## 2017-10-20 ENCOUNTER — Ambulatory Visit (INDEPENDENT_AMBULATORY_CARE_PROVIDER_SITE_OTHER): Payer: Medicare Other | Admitting: Vascular Surgery

## 2017-10-20 ENCOUNTER — Encounter (INDEPENDENT_AMBULATORY_CARE_PROVIDER_SITE_OTHER): Payer: Self-pay | Admitting: Vascular Surgery

## 2017-10-20 VITALS — BP 120/63 | HR 67 | Resp 16 | Ht 59.0 in | Wt 150.0 lb

## 2017-10-20 DIAGNOSIS — I872 Venous insufficiency (chronic) (peripheral): Secondary | ICD-10-CM

## 2017-10-20 DIAGNOSIS — E782 Mixed hyperlipidemia: Secondary | ICD-10-CM | POA: Diagnosis not present

## 2017-10-20 DIAGNOSIS — I890 Lymphedema, not elsewhere classified: Secondary | ICD-10-CM | POA: Diagnosis not present

## 2017-10-20 DIAGNOSIS — I5022 Chronic systolic (congestive) heart failure: Secondary | ICD-10-CM | POA: Diagnosis not present

## 2017-10-20 NOTE — Progress Notes (Signed)
MRN : [id number]  [name] is a 83 y.o. (05-29-1935) female who presents with chief complaint of  Chief Complaint  Patient presents with  . Follow-up  .  History of Present Illness:   The patient returns to the office for followup evaluation regarding leg swelling.  The swelling has improved quite a bit and the pain associated with swelling has decreased substantially. There have not been any interval development of a ulcerations or wounds.  Since the previous visit the patient has been wearing graduated compression stockings and has noted little significant improvement in the lymphedema. The patient has been using compression routinely morning until night.  The patient also states elevation during the day and exercise is being done too.    Current Meds  Medication Sig  . acetaminophen (TYLENOL) 650 MG CR tablet Take 650 mg by mouth every 8 (eight) hours as needed for pain.  Marland Kitchen amiodarone (PACERONE) 200 MG tablet TAKE 1 TABLET BY MOUTH DAILY  . aspirin EC 81 MG tablet Take 81 mg by mouth daily.   . Cholecalciferol (VITAMIN D) 2000 units tablet Take 2,000 Units by mouth daily.  . furosemide (LASIX) 20 MG tablet Take 1 tablet (20 mg total) by mouth 3 (three) times daily.  Marland Kitchen KLOR-CON 10 10 MEQ tablet Take 10 mEq by mouth daily.   Marland Kitchen losartan (COZAAR) 25 MG tablet Take 0.5 tablets (12.5 mg total) by mouth daily.  . metolazone (ZAROXOLYN) 5 MG tablet Take 1 tablet (5 mg total) by mouth as needed (As needed for weight greater than 148).  . Multiple Vitamin (MULTIVITAMIN WITH MINERALS) TABS tablet Take 1 tablet by mouth daily.  . Probiotic Product (RISA-BID PROBIOTIC PO) Take 1 capsule by mouth daily.   . ranitidine (ZANTAC) 150 MG tablet Take 300 mg by mouth at bedtime.  Marland Kitchen spironolactone (ALDACTONE) 25 MG tablet Take 25 mg by mouth daily.   . vitamin C (ASCORBIC ACID) 500 MG tablet Take 500 mg by mouth daily.  Marland Kitchen warfarin (COUMADIN) 6 MG tablet Take 3-6 mg by mouth See admin  instructions. Take 3 mg once daily at night except for Wednesdays take 6 mg once nightly    Past Medical History:  Diagnosis Date  . Arthritis    "back, hands, knees" (04/10/2016)  . Chronic atrial fibrillation ([place])    a. On Coumadin  . Chronic back pain   . 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 EF 25% by echo in 08/2015 b. RHC in 08/2015 showed normal filling pressures  . Compression fracture    "several; all in my back" (04/10/2016)  . GERD (gastroesophageal reflux disease)   . Hypertension   . S/P MVR (mitral valve replacement)    a. MVR 1994 b. redo MVR in 08/2014 - on Coumadin  . Shortness of breath dyspnea     Past Surgical History:  Procedure Laterality Date  . BREAST LUMPECTOMY Right 2005?   benign lump excision  . CARDIAC CATHETERIZATION    . [place], 2016   "MVR; MVR"  . CATARACT EXTRACTION W/PHACO Right 02/20/2015   Procedure: CATARACT EXTRACTION PHACO AND INTRAOCULAR LENS PLACEMENT (IOC);  Surgeon: Estill Cotta, MD;  Location: [facility];  Service: Ophthalmology;  Laterality: Right;  US01:31.2 AP 25.3% CDE40.13 Fluid lot # [id number]  . ELECTROPHYSIOLOGIC STUDY N/A 10/09/2015   Procedure: CARDIOVERSION;  Surgeon: Wellington Hampshire, MD;  Location: [facility];  Service: Cardiovascular;  Laterality: N/A;  .  IR GENERIC HISTORICAL  04/01/2016   IR RADIOLOGIST EVAL & MGMT 04/01/2016 [facility]  . IR GENERIC HISTORICAL  04/15/2016   IR VERTEBROPLASTY CERV/THOR BX INC UNI/BIL INC/INJECT/IMAGING 04/15/2016 Luanne Bras, MD [facility]  . IR GENERIC HISTORICAL  04/15/2016   IR VERTEBROPLASTY EA ADDL (T&LS) BX INC UNI/BIL INC INJECT/IMAGING 04/15/2016 Luanne Bras, MD [facility]  . IR GENERIC HISTORICAL  04/15/2016   IR VERTEBROPLASTY EA ADDL (T&LS) BX INC UNI/BIL INC INJECT/IMAGING 04/15/2016 Luanne Bras, MD [facility]  . IR GENERIC HISTORICAL  05/20/2016   IR RADIOLOGIST  EVAL & MGMT 05/20/2016 [facility]  . IR GENERIC HISTORICAL  06/13/2016   IR VERTEBROPLASTY EA ADDL (T&LS) BX INC UNI/BIL INC INJECT/IMAGING 06/13/2016 Luanne Bras, MD [facility]  . IR GENERIC HISTORICAL  06/13/2016   IR SACROPLASTY BILATERAL 06/13/2016 Luanne Bras, MD [facility]  . IR GENERIC HISTORICAL  06/13/2016   IR VERTEBROPLASTY LUMBAR BX INC UNI/BIL INC/INJECT/IMAGING 06/13/2016 Luanne Bras, MD [facility]  . IRRIGATION AND DEBRIDEMENT HEMATOMA Left 07/05/2015   Procedure: IRRIGATION AND DEBRIDEMENT HEMATOMA;  Surgeon: Robert Bellow, MD;  Location: [facility];  Service: General;  Laterality: Left;  . pyloric stenosis  07/1937  . US ECHOCARDIOGRAPHY      Social History Social History   Tobacco Use  . Smoking status: Former Smoker    Packs/day: 1.00    Years: 27.00    Pack years: 27.00    Types: Cigarettes  . Smokeless tobacco: Never Used  . Tobacco comment: "quit smoking ~ 1980  Substance Use Topics  . Alcohol use: Not Currently    Alcohol/week: 0.0 oz    Comment: 04/10/2016 "I'll have a drink on holidays/special occasions"  . Drug use: No    Family History Family History  Problem Relation Age of Onset  . Stroke Mother   . Renal Disease Father     Allergies  Allergen Reactions  . Ace Inhibitors     Cough  . Hydrocodone Itching and Nausea Only  . Mercury     Other reaction(s): Unknown  . Silver Dermatitis    Severe itching  . Cephalexin Rash  . Clindamycin/Lincomycin Rash  . Nickel Rash  . Penicillins Rash    Has patient had a PCN reaction causing immediate rash, facial/tongue/throat swelling, SOB or lightheadedness with hypotension: YES Has patient had a PCN reaction causing severe rash involving mucus membranes or skin necrosis: NO Has patient had a PCN reaction that required hospitalization NO Has patient had a PCN reaction occurring within the last 10 years: NO If all of the above answers are "NO", then may proceed with  Cephalosporin use.     REVIEW OF SYSTEMS (Negative unless checked)  Constitutional: [] Weight loss  [] Fever  [] Chills Cardiac: [] Chest pain   [] Chest pressure   [] Palpitations   [] Shortness of breath when laying flat   [] Shortness of breath with exertion. Vascular:  [] Pain in legs with walking   [] Pain in legs at rest  [] History of DVT   [] Phlebitis   [x] Swelling in legs   [x] Varicose veins   [] Non-healing ulcers Pulmonary:   [] Uses home oxygen   [] Productive cough   [] Hemoptysis   [] Wheeze  [] COPD   [] Asthma Neurologic:  [] Dizziness   [] Seizures   [] History of stroke   [] History of TIA  [] Aphasia   [] Vissual changes   [] Weakness or numbness in arm   [] Weakness or numbness in leg Musculoskeletal:   [] Joint swelling   [] Joint pain   [] Low  back pain Hematologic:  [] Easy bruising  [] Easy bleeding   [] Hypercoagulable state   [] Anemic Gastrointestinal:  [] Diarrhea   [] Vomiting  [] Gastroesophageal reflux/heartburn   [] Difficulty swallowing. Genitourinary:  [] Chronic kidney disease   [] Difficult urination  [] Frequent urination   [] Blood in urine Skin:  [] Rashes   [] Ulcers  Psychological:  [] History of anxiety   []  History of major depression.  Physical Examination  Vitals:   10/20/17 1359  BP: 120/63  Pulse: 67  Resp: 16  Weight: 150 lb (68 kg)  Height: 4\' 11"  (1.499 m)   Body mass index is 30.3 kg/m. Gen: WD/WN, NAD Head: [place]/AT, No temporalis wasting.  Ear/Nose/Throat: Hearing grossly intact, nares w/o erythema or drainage Eyes: PER, EOMI, sclera nonicteric.  Neck: Supple, no large masses.   Pulmonary:  Good air movement, no audible wheezing bilaterally, no use of accessory muscles.  Cardiac: RRR, no JVD Vascular: scattered varicosities present bilaterally.  Moderate venous stasis changes to the legs bilaterally.  2+ soft pitting edema Vessel Right Left  Radial Palpable Palpable  PT Not Palpable Not Palpable  DP Not Palpable Not Palpable  Gastrointestinal: Non-distended. No  guarding/no peritoneal signs.  Musculoskeletal: M/S 5/5 throughout.  No deformity or atrophy.  Neurologic: CN 2-12 intact. Symmetrical.  Speech is fluent. Motor exam as listed above. Psychiatric: Judgment intact, Mood & affect appropriate for pt's clinical situation. Dermatologic: venous rashes no ulcers noted.  No changes consistent with cellulitis. Lymph : No lichenification or skin changes of chronic lymphedema.  CBC Lab Results  Component Value Date   WBC 3.7 07/01/2016   HGB 11.7 (L) 07/01/2016   HCT 35.5 07/01/2016   MCV 82.3 07/01/2016   PLT 220 07/01/2016    BMET    Component Value Date/Time   NA 139 09/29/2017 1214   NA 143 03/27/2012 0418   K 5.5 (H) 09/29/2017 1214   K 3.9 02/20/2015 0630   CL 96 09/29/2017 1214   CL 111 (H) 03/27/2012 0418   CO2 19 (L) 09/29/2017 1214   CO2 21 03/27/2012 0418   GLUCOSE 76 09/29/2017 1214   GLUCOSE 105 (H) 09/01/2017 1218   GLUCOSE 96 03/27/2012 0418   BUN 55 (H) 09/29/2017 1214   BUN 10 03/27/2012 0418   CREATININE 1.82 (H) 09/29/2017 1214   CREATININE 0.84 03/27/2012 0418   CALCIUM 9.8 09/29/2017 1214   CALCIUM 8.3 (L) 03/27/2012 0418   GFRNONAA 26 (L) 09/29/2017 1214   GFRNONAA >60 03/27/2012 0418   GFRAA 30 (L) 09/29/2017 1214   GFRAA >60 03/27/2012 0418   CrCl cannot be calculated (Patient's most recent lab result is older than the maximum 21 days allowed.).  COAG Lab Results  Component Value Date   INR 2.7 10/15/2017   INR 3.0 09/22/2017   INR 2.3 09/09/2017    Radiology No results found.   Assessment/Plan  1. Chronic venous insufficiency No surgery or intervention at this point in time.    I have had a long discussion with the patient regarding venous insufficiency and why it  causes symptoms. I have discussed with the patient the chronic skin changes that accompany venous insufficiency and the long term sequela such as infection and ulceration.  Patient will begin wearing graduated compression stockings  class 1 (20-30 mmHg) or compression wraps on a daily basis a prescription was given. The patient will put the stockings on first thing in the morning and removing them in the evening. The patient is instructed specifically not to sleep in the stockings.  In addition, behavioral modification including several periods of elevation of the lower extremities during the day will be continued. I have demonstrated that proper elevation is a position with the ankles at heart level.  The patient is instructed to begin routine exercise, especially walking on a daily basis  Following the review of the ultrasound the patient will follow up in 12 months to reassess the degree of swelling and the control that graduated compression stockings or compression wraps  is offering.     2. Lymphedema  No surgery or intervention at this point in time.    I have reviewed my discussion with the patient regarding lymphedema and why it  causes symptoms.  Patient will continue wearing graduated compression stockings class 1 (20-30 mmHg) on a daily basis a prescription was given. The patient is reminded to put the stockings on first thing in the morning and removing them in the evening. The patient is instructed specifically not to sleep in the stockings.   In addition, behavioral modification throughout the day will be continued.  This will include frequent elevation (such as in a recliner), use of over the counter pain medications as needed and exercise such as walking.  I have reviewed systemic causes for chronic edema such as liver, kidney and cardiac etiologies and there does not appear to be any significant changes in these organ systems over the past year.  The patient is under the impression that these organ systems are all stable and unchanged.    The patient will continue aggressive use of the  lymph pump.  This will continue to improve the edema control and prevent sequela such as ulcers and infections.    The patient will follow-up with me on an annual basis.    3. Chronic systolic heart failure (HCC) Continue cardiac and antihypertensive medications as already ordered and reviewed, no changes at this time.  Continue statin as ordered and reviewed, no changes at this time  Nitrates PRN for chest pain  4. Mixed hyperlipidemia Continue statin as ordered and reviewed, no changes at this time     Hortencia Pilar, MD  10/20/2017 2:12 PM

## 2017-10-25 NOTE — Progress Notes (Signed)
Patient ID: [name], female    DOB: Mar 12, 1935, 83 y.o.   MRN: [id number]  HPI  [name] is a 83 y/o female with a history of HTN, CKD, GERD, atrial fibrillation, lymphedema, MV replacement X2, compression fracture, chronic back pain and chronic heart failure.   Echo report from 09/01/17 reviewed and showed an EF of 35-40% along with mild/mod AS and mildly elevated PA pressure of 40 mm Hg.   Has not been admitted or been in the ED in the last 6 months.  She presents today for her initial visit with a chief complaint of moderate fatigue with minimal exertion. She describes this as chronic in nature. She has associated shortness of breath, rhinorrhea, chronic back pain, difficulty sleeping, abdominal distention and gradual weight gain. She denies any palpitations, leg edema, chest pain or dizziness.   Past Medical History:  Diagnosis Date  . Arthritis    "back, hands, knees" (04/10/2016)  . Chronic atrial fibrillation ([place])    a. On Coumadin  . Chronic back pain   . 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 EF 25% by echo in 08/2015 b. RHC in 08/2015 showed normal filling pressures  . Chronic kidney disease   . Compression fracture    "several; all in my back" (04/10/2016)  . GERD (gastroesophageal reflux disease)   . Hypertension   . Lymphedema   . S/P MVR (mitral valve replacement)    a. MVR 1994 b. redo MVR in 08/2014 - on Coumadin  . Shortness of breath dyspnea    Past Surgical History:  Procedure Laterality Date  . BREAST LUMPECTOMY Right 2005?   benign lump excision  . CARDIAC CATHETERIZATION    . [place], 2016   "MVR; MVR"  . CATARACT EXTRACTION W/PHACO Right 02/20/2015   Procedure: CATARACT EXTRACTION PHACO AND INTRAOCULAR LENS PLACEMENT (IOC);  Surgeon: Estill Cotta, MD;  Location: [facility];  Service: Ophthalmology;  Laterality: Right;  US01:31.2 AP 25.3% CDE40.13 Fluid lot #  [id number]  . ELECTROPHYSIOLOGIC STUDY N/A 10/09/2015   Procedure: CARDIOVERSION;  Surgeon: Wellington Hampshire, MD;  Location: [facility];  Service: Cardiovascular;  Laterality: N/A;  . IR GENERIC HISTORICAL  04/01/2016   IR RADIOLOGIST EVAL & MGMT 04/01/2016 [facility]  . IR GENERIC HISTORICAL  04/15/2016   IR VERTEBROPLASTY CERV/THOR BX INC UNI/BIL INC/INJECT/IMAGING 04/15/2016 Luanne Bras, MD [facility]  . IR GENERIC HISTORICAL  04/15/2016   IR VERTEBROPLASTY EA ADDL (T&LS) BX INC UNI/BIL INC INJECT/IMAGING 04/15/2016 Luanne Bras, MD [facility]  . IR GENERIC HISTORICAL  04/15/2016   IR VERTEBROPLASTY EA ADDL (T&LS) BX INC UNI/BIL INC INJECT/IMAGING 04/15/2016 Luanne Bras, MD [facility]  . IR GENERIC HISTORICAL  05/20/2016   IR RADIOLOGIST EVAL & MGMT 05/20/2016 [facility]  . IR GENERIC HISTORICAL  06/13/2016   IR VERTEBROPLASTY EA ADDL (T&LS) BX INC UNI/BIL INC INJECT/IMAGING 06/13/2016 Luanne Bras, MD [facility]  . IR GENERIC HISTORICAL  06/13/2016   IR SACROPLASTY BILATERAL 06/13/2016 Luanne Bras, MD [facility]  . IR GENERIC HISTORICAL  06/13/2016   IR VERTEBROPLASTY LUMBAR BX INC UNI/BIL INC/INJECT/IMAGING 06/13/2016 Luanne Bras, MD [facility]  . IRRIGATION AND DEBRIDEMENT HEMATOMA Left 07/05/2015   Procedure: IRRIGATION AND DEBRIDEMENT HEMATOMA;  Surgeon: Robert Bellow, MD;  Location: [facility];  Service: General;  Laterality: Left;  . pyloric stenosis  07/1937  . US ECHOCARDIOGRAPHY     Family History  Problem  Relation Age of Onset  . Stroke Mother   . Renal Disease Father    Social History   Tobacco Use  . Smoking status: Former Smoker    Packs/day: 1.00    Years: 27.00    Pack years: 27.00    Types: Cigarettes  . Smokeless tobacco: Never Used  . Tobacco comment: "quit smoking ~ 1980  Substance Use Topics  . Alcohol use: Not Currently    Alcohol/week: 0.0 oz    Comment: 04/10/2016 "I'll have a drink on  holidays/special occasions"   Allergies  Allergen Reactions  . Ace Inhibitors     Cough  . Hydrocodone Itching and Nausea Only  . Mercury     Other reaction(s): Unknown  . Silver Dermatitis    Severe itching  . Cephalexin Rash  . Clindamycin/Lincomycin Rash  . Nickel Rash  . Penicillins Rash    Has patient had a PCN reaction causing immediate rash, facial/tongue/throat swelling, SOB or lightheadedness with hypotension: YES Has patient had a PCN reaction causing severe rash involving mucus membranes or skin necrosis: NO Has patient had a PCN reaction that required hospitalization NO Has patient had a PCN reaction occurring within the last 10 years: NO If all of the above answers are "NO", then may proceed with Cephalosporin use.   Prior to Admission medications   Medication Sig Start Date End Date Taking? Authorizing Provider  acetaminophen (TYLENOL) 650 MG CR tablet Take 650 mg by mouth every 8 (eight) hours as needed for pain.   Yes [provider]  amiodarone (PACERONE) 200 MG tablet TAKE 1 TABLET BY MOUTH DAILY 09/30/17  Yes Gollan, Kathlene November, MD  aspirin EC 81 MG tablet Take 81 mg by mouth daily.    Yes [provider]  Cholecalciferol (VITAMIN D) 2000 units tablet Take 2,000 Units by mouth daily.   Yes [provider]  furosemide (LASIX) 20 MG tablet Take 1 tablet (20 mg total) by mouth 3 (three) times daily. 09/29/17  Yes Gollan, Kathlene November, MD  KLOR-CON 10 10 MEQ tablet Take 10 mEq by mouth daily.  05/08/16  Yes [provider]  losartan (COZAAR) 25 MG tablet Take 0.5 tablets (12.5 mg total) by mouth daily. 09/02/17 12/01/17 Yes Dunn, Areta Haber, PA-C  Menthol (ICY HOT) 5 % PTCH Apply 1 patch topically daily as needed (pain).   Yes [provider]  metolazone (ZAROXOLYN) 5 MG tablet Take 1 tablet (5 mg total) by mouth as needed (As needed for weight greater than 148). 09/29/17 12/28/17 Yes Gollan, Kathlene November, MD  Multiple Vitamin (MULTIVITAMIN WITH  MINERALS) TABS tablet Take 1 tablet by mouth daily.   Yes [provider]  Probiotic Product (RISA-BID PROBIOTIC PO) Take 1 capsule by mouth daily.    Yes [provider]  ranitidine (ZANTAC) 150 MG tablet Take 300 mg by mouth at bedtime.   Yes [provider]  spironolactone (ALDACTONE) 25 MG tablet Take 25 mg by mouth daily.    Yes [provider]  vitamin C (ASCORBIC ACID) 500 MG tablet Take 500 mg by mouth daily.   Yes [provider]  warfarin (COUMADIN) 6 MG tablet Take 3-6 mg by mouth See admin instructions. Take 3 mg once daily at night except for Wednesdays take 6 mg once nightly   Yes [provider]    Review of Systems  Constitutional: Positive for fatigue. Negative for appetite change.  HENT: Positive for rhinorrhea. Negative for congestion and sore throat.  Eyes: Negative.   Respiratory: Positive for shortness of breath. Negative for chest tightness.   Cardiovascular: Negative for chest pain, palpitations and leg swelling.  Gastrointestinal: Positive for abdominal distention. Negative for abdominal pain.  Endocrine: Negative.   Genitourinary: Negative.   Musculoskeletal: Positive for back pain. Negative for neck pain.  Skin: Negative.   Allergic/Immunologic: Negative.   Neurological: Negative for dizziness and light-headedness.  Hematological: Negative for adenopathy. Does not bruise/bleed easily.  Psychiatric/Behavioral: Positive for sleep disturbance (intermittent; wears oxygen at 2L around the clock). Negative for dysphoric mood. The patient is not nervous/anxious.     Vitals:   10/27/17 1409  BP: (!) 142/63  Pulse: 65  Resp: 18  SpO2: 100%  Weight: 150 lb 6 oz (68.2 kg)  Height: 4\' 11"  (1.499 m)   Wt Readings from Last 3 Encounters:  10/27/17 150 lb 6 oz (68.2 kg)  10/20/17 150 lb (68 kg)  09/29/17 148 lb (67.1 kg)   Lab Results  Component Value Date   CREATININE 1.82 (H) 09/29/2017   CREATININE 1.78  (H) 09/22/2017   CREATININE 1.41 (H) 09/01/2017   Physical Exam  Constitutional: She is oriented to person, place, and time. She appears well-developed and well-nourished.  HENT:  Head: Normocephalic and atraumatic.  Neck: Normal range of motion. Neck supple.  Cardiovascular: Normal rate and regular rhythm.  Pulmonary/Chest: Effort normal. No respiratory distress. She has no wheezes. She has no rales.  Abdominal: Soft. She exhibits distension.  Musculoskeletal:       Lumbar back: She exhibits pain.       Right lower leg: She exhibits tenderness. She exhibits no edema.       Left lower leg: She exhibits tenderness. She exhibits no edema.  Neurological: She is alert and oriented to person, place, and time.  Skin: Skin is warm and dry.  Nursing note and vitals reviewed.  Assessment & Plan:  1: Chronic heart failure with reduced ejection fraction- - NYHA class III - mildly fluid overloaded with abdominal distention - weighing daily and she was instructed to call for an overnight weight gain of >2 pounds or a weekly weight gain of >5 pounds - weight stable from one week ago - not adding salt to her food and tries to eat low sodium foods. Reviewed the importance of reading food labels for sodium content and written dietary information was given so that she can follow a 2000mg  sodium diet - she says that she drinks "a lot" of fluids because her mouth is dry and has not idea how much she's actually drinking. - discussed the importance of keeping fluid intake to 40-60 ounces of fluid daily. Hospital cup given and she was instructed that she could have 2 full cups daily of liquids. Encouraged her to try sucking on hard candy or chewing gum if she can for her dry mouth - she says that she eats a lot of ice and explained that that was more difficult to measure because of melting - consider adding metoprolol succinate if HR allows - on low dose losartan and has allergy to ACE-I so unsure if she  could tolerate entresto - saw cardiology Mina Marble) 10/13/17 - saw cardiology Rockey Situ) 09/29/17 & returns June 1 - BNP 04/13/16 was 283.3 - son says that she's been through pulmonary rehab twice but doesn't continue the exercise at home once it ends  2: CKD- - BP looks good today - saw PCP Sabra Heck) 10/16/17 - BMP on 10/13/17 reviewed and showed sodium 136,  4.5 and GFR 20  3: Lymphedema- - does wear compression socks daily with removal at bedtime; admits that she doesn't like wearing them but son says that without them, her legs become greatly swollen. Has had to go to the wound center in the past  - says that she elevates her legs some during the day - wearing compression pumps twice daily for ~ 1 hour each time - says that she's been on furosemide (now has to take brand Lasix), torsemide and metolazone without much relief  4: Atrial fibrillation- - developed AF after MV replaced and had to be cardioverted a year later - HR has been regular ever since cardioversion - INR 10/15/17 was 2.7  Medication list was reviewed.  Return here in 1 month or sooner for any questions/problems before then.

## 2017-10-27 ENCOUNTER — Ambulatory Visit: Payer: Medicare Other | Attending: Family | Admitting: Family

## 2017-10-27 ENCOUNTER — Encounter: Payer: Self-pay | Admitting: Family

## 2017-10-27 VITALS — BP 142/63 | HR 65 | Resp 18 | Ht 59.0 in | Wt 150.4 lb

## 2017-10-27 DIAGNOSIS — Z841 Family history of disorders of kidney and ureter: Secondary | ICD-10-CM | POA: Diagnosis not present

## 2017-10-27 DIAGNOSIS — Z7901 Long term (current) use of anticoagulants: Secondary | ICD-10-CM | POA: Diagnosis not present

## 2017-10-27 DIAGNOSIS — Z888 Allergy status to other drugs, medicaments and biological substances status: Secondary | ICD-10-CM | POA: Diagnosis not present

## 2017-10-27 DIAGNOSIS — I482 Chronic atrial fibrillation: Secondary | ICD-10-CM | POA: Diagnosis not present

## 2017-10-27 DIAGNOSIS — M1990 Unspecified osteoarthritis, unspecified site: Secondary | ICD-10-CM | POA: Insufficient documentation

## 2017-10-27 DIAGNOSIS — I890 Lymphedema, not elsewhere classified: Secondary | ICD-10-CM | POA: Diagnosis not present

## 2017-10-27 DIAGNOSIS — G8929 Other chronic pain: Secondary | ICD-10-CM | POA: Diagnosis not present

## 2017-10-27 DIAGNOSIS — N189 Chronic kidney disease, unspecified: Secondary | ICD-10-CM | POA: Insufficient documentation

## 2017-10-27 DIAGNOSIS — Z952 Presence of prosthetic heart valve: Secondary | ICD-10-CM | POA: Diagnosis not present

## 2017-10-27 DIAGNOSIS — M549 Dorsalgia, unspecified: Secondary | ICD-10-CM | POA: Diagnosis not present

## 2017-10-27 DIAGNOSIS — Z961 Presence of intraocular lens: Secondary | ICD-10-CM | POA: Insufficient documentation

## 2017-10-27 DIAGNOSIS — Z880 Allergy status to penicillin: Secondary | ICD-10-CM | POA: Diagnosis not present

## 2017-10-27 DIAGNOSIS — Z9841 Cataract extraction status, right eye: Secondary | ICD-10-CM | POA: Insufficient documentation

## 2017-10-27 DIAGNOSIS — K219 Gastro-esophageal reflux disease without esophagitis: Secondary | ICD-10-CM | POA: Insufficient documentation

## 2017-10-27 DIAGNOSIS — Z823 Family history of stroke: Secondary | ICD-10-CM | POA: Insufficient documentation

## 2017-10-27 DIAGNOSIS — I5022 Chronic systolic (congestive) heart failure: Secondary | ICD-10-CM

## 2017-10-27 DIAGNOSIS — Z79899 Other long term (current) drug therapy: Secondary | ICD-10-CM | POA: Diagnosis not present

## 2017-10-27 DIAGNOSIS — Z885 Allergy status to narcotic agent status: Secondary | ICD-10-CM | POA: Diagnosis not present

## 2017-10-27 DIAGNOSIS — Z87891 Personal history of nicotine dependence: Secondary | ICD-10-CM | POA: Diagnosis not present

## 2017-10-27 DIAGNOSIS — Z7982 Long term (current) use of aspirin: Secondary | ICD-10-CM | POA: Insufficient documentation

## 2017-10-27 DIAGNOSIS — I130 Hypertensive heart and chronic kidney disease with heart failure and stage 1 through stage 4 chronic kidney disease, or unspecified chronic kidney disease: Secondary | ICD-10-CM | POA: Insufficient documentation

## 2017-10-27 DIAGNOSIS — Z881 Allergy status to other antibiotic agents status: Secondary | ICD-10-CM | POA: Insufficient documentation

## 2017-10-27 DIAGNOSIS — N183 Chronic kidney disease, stage 3 unspecified: Secondary | ICD-10-CM

## 2017-10-27 DIAGNOSIS — I509 Heart failure, unspecified: Secondary | ICD-10-CM | POA: Diagnosis present

## 2017-10-27 DIAGNOSIS — I480 Paroxysmal atrial fibrillation: Secondary | ICD-10-CM

## 2017-10-27 NOTE — Patient Instructions (Addendum)
Continue weighing daily and call for an overnight weight gain of > 2 pounds or a weekly weight gain of >5 pounds.  Maintain fluid intake between 40-60 ounces of fluid daily.  [facility]

## 2017-10-29 ENCOUNTER — Ambulatory Visit (INDEPENDENT_AMBULATORY_CARE_PROVIDER_SITE_OTHER): Payer: Medicare Other

## 2017-10-29 DIAGNOSIS — Z5181 Encounter for therapeutic drug level monitoring: Secondary | ICD-10-CM

## 2017-10-29 DIAGNOSIS — Z952 Presence of prosthetic heart valve: Secondary | ICD-10-CM

## 2017-10-29 DIAGNOSIS — I480 Paroxysmal atrial fibrillation: Secondary | ICD-10-CM

## 2017-10-29 LAB — POCT INR: INR: 2.6

## 2017-10-29 NOTE — Patient Instructions (Signed)
Please continue dosage of 3mg  (1/2 tablet) daily, except 6mg  on Wednesdays and Saturdays. Recheck INR in 3 weeks.

## 2017-11-05 ENCOUNTER — Telehealth: Payer: Self-pay | Admitting: Cardiovascular Disease

## 2017-11-05 ENCOUNTER — Telehealth: Payer: Self-pay | Admitting: Family

## 2017-11-05 DIAGNOSIS — I5022 Chronic systolic (congestive) heart failure: Secondary | ICD-10-CM

## 2017-11-05 NOTE — Telephone Encounter (Signed)
Renal function 3 weeks ago  Creatinine at high end of range, Cr 2.3 Would not use metolazone frequently, only sparingly to avoid renal failure Would repeat BMP and BNP when they are able. Renal function might be worse, unclear at this time

## 2017-11-05 NOTE — Telephone Encounter (Signed)
Returned message left from patient's daughter, [name], regarding patient's weight gain. She says that in the last week, patient's weight has risen from 150 pounds to 154 pounds and has stayed steady for the last 3 days. She has also developed some edema in her ankles.   Currently taking 60mg  lasix daily and does have metolazone that she can take. [name] says that patient has taken some but she's unsure of how many times she's taken the metolazone.   They are monitoring sodium intake carefully and are trying to limit her fluid intake. [name] to give patient an additional 40mg  lasix daily for the next 2 days and let me know if weight/edema continues. [name] verbalized understanding.

## 2017-11-05 NOTE — Telephone Encounter (Signed)
Patient daughter [name] calling  Had been told that if patients weight had been more than 150 to contact office Patients weight has been 154 for about 3 or 4 days  Please call [name] to discuss

## 2017-11-05 NOTE — Telephone Encounter (Signed)
Spoke with patients daughter per release form and she states that she has been taking Furosemide 3 times a day and metolazone every other day since last Thursday. No weight decrease at all and they are really watching her fluid intake. She reports that her ankles and feet have been swelling and has not had any shortness of breath. She does wear compression hose and elevates when possible. She did have labs done last week at Dr. Ammie Ferrier office. Reviewed with her that I would have Dr. Rockey Situ review and will give her a call back with any recommendations. She verbalized understanding and has no further questions at this time.

## 2017-11-06 NOTE — Telephone Encounter (Signed)
Spoke with patients daughter and reviewed Dr. Donivan Scull recommendations to have repeat lab work when possible. Orders placed for the [facility] Entrance to get those done. She was appreciative for the call back and had no further questions at this time.

## 2017-11-17 ENCOUNTER — Ambulatory Visit (INDEPENDENT_AMBULATORY_CARE_PROVIDER_SITE_OTHER): Payer: Medicare Other

## 2017-11-17 DIAGNOSIS — I480 Paroxysmal atrial fibrillation: Secondary | ICD-10-CM | POA: Diagnosis not present

## 2017-11-17 DIAGNOSIS — Z5181 Encounter for therapeutic drug level monitoring: Secondary | ICD-10-CM

## 2017-11-17 DIAGNOSIS — Z952 Presence of prosthetic heart valve: Secondary | ICD-10-CM

## 2017-11-17 LAB — POCT INR
INR POC, (MECH HEART VALVE RANGE) POC (MECHANICAL HEART VAL): 2.7 (ref 2.5–3.5)
INR POC, (Mech Heart Valve Range): 2.7 (ref 2.5–3.5)
INR: 2.7 (ref 2.0–3.0)

## 2017-11-17 NOTE — Patient Instructions (Signed)
Please continue dosage of 3mg  (1/2 tablet) daily, except 6mg  on Wednesdays and Saturdays.  Recheck INR in 4 weeks.

## 2017-11-26 ENCOUNTER — Ambulatory Visit (INDEPENDENT_AMBULATORY_CARE_PROVIDER_SITE_OTHER): Payer: Medicare Other

## 2017-11-26 DIAGNOSIS — I480 Paroxysmal atrial fibrillation: Secondary | ICD-10-CM

## 2017-11-26 DIAGNOSIS — Z952 Presence of prosthetic heart valve: Secondary | ICD-10-CM

## 2017-11-26 DIAGNOSIS — Z5181 Encounter for therapeutic drug level monitoring: Secondary | ICD-10-CM | POA: Diagnosis not present

## 2017-11-26 LAB — POCT INR: INR: 3 (ref 2.0–3.0)

## 2017-11-26 NOTE — Patient Instructions (Signed)
Please continue dosage of 3mg  (1/2 tablet) daily, except 6mg  on Wednesdays and Saturdays.  Recheck INR in 1 week, per Dr. Mina Marble @ [facility] in preparation for His bundle CRT on 6/12.

## 2017-12-01 ENCOUNTER — Ambulatory Visit: Payer: Medicare Other | Admitting: Family

## 2017-12-03 ENCOUNTER — Ambulatory Visit (INDEPENDENT_AMBULATORY_CARE_PROVIDER_SITE_OTHER): Payer: Medicare Other

## 2017-12-03 ENCOUNTER — Telehealth: Payer: Self-pay

## 2017-12-03 DIAGNOSIS — Z5181 Encounter for therapeutic drug level monitoring: Secondary | ICD-10-CM

## 2017-12-03 DIAGNOSIS — I480 Paroxysmal atrial fibrillation: Secondary | ICD-10-CM

## 2017-12-03 DIAGNOSIS — Z952 Presence of prosthetic heart valve: Secondary | ICD-10-CM | POA: Diagnosis not present

## 2017-12-03 LAB — POCT INR: INR: 2.8 (ref 2.0–3.0)

## 2017-12-03 NOTE — Telephone Encounter (Signed)
Pt is sched for His bundle CRT @ [facility] on 6/12. Pt was advised to continue coumadin through procedure. Left message w/ Verland @ Dr. [facility] Fantasia office to let EP know that pt's INR is 2.8 today (range 2.5-3.5) and to verify what parameters they need prior to pt's procedure.  Sched pt for recheck INR on 6/10.

## 2017-12-03 NOTE — Patient Instructions (Signed)
Please continue dosage of 3mg  (1/2 tablet) daily, except 6mg  on Wednesdays and Saturdays. Recheck INR on Monday in preparation for His bundle CRT on 6/12.

## 2017-12-04 ENCOUNTER — Telehealth: Payer: Self-pay | Admitting: *Deleted

## 2017-12-04 NOTE — Telephone Encounter (Signed)
[name] with [facility] called from Dr [facility] Fantasia office to say that Dr Coy Saunas wants Pts INR to be 2.5-3.0 for her pending procedure on 12/10/17. I telephoned the pt and told her to eat an extra leafy green vegetable on Saturday and report for her INR check on Monday.

## 2017-12-04 NOTE — Telephone Encounter (Signed)
Follow Up:    Pt said she talked to you earlier,she needs to talk to you again please.

## 2017-12-04 NOTE — Telephone Encounter (Signed)
Telephoned pt back and we discussed eating an extra serving on Saturday of leafy green vegetable to help with her INR being in the lower end of her range on Monday. She is aware that Dr Coy Saunas wants her INR between 2.5-3.0 for her procedure on 12/10/17 per Joellen Jersey from [facility].

## 2017-12-05 ENCOUNTER — Telehealth: Payer: Self-pay

## 2017-12-05 NOTE — Telephone Encounter (Signed)
Spoke with the patient regarding a prior auth for Metolazone 5 mg tablet PA case [id number] for Rx # [id number]. The patient is being followed at [facility] with Dr. Thane Edu. Dr. Coy Saunas has made changes with her dose of Metolazone. The prior authorization needs to come from their office at [facility]. The patient has already picked up the new Rx for Metolazone 2.5 mg. Please contact Dr. Coy Saunas at [phone].

## 2017-12-08 ENCOUNTER — Ambulatory Visit (INDEPENDENT_AMBULATORY_CARE_PROVIDER_SITE_OTHER): Payer: Medicare Other

## 2017-12-08 DIAGNOSIS — I480 Paroxysmal atrial fibrillation: Secondary | ICD-10-CM

## 2017-12-08 DIAGNOSIS — Z5181 Encounter for therapeutic drug level monitoring: Secondary | ICD-10-CM

## 2017-12-08 DIAGNOSIS — Z952 Presence of prosthetic heart valve: Secondary | ICD-10-CM

## 2017-12-08 LAB — POCT INR: INR: 2.3 (ref 2.0–3.0)

## 2017-12-08 NOTE — Telephone Encounter (Addendum)
Spoke w/ Katie @ [facility] and she is agreeable to having pt take extra dose of coumadin tonight and proceeding w/ His bundle CRT as scheduled on Wed.  Asked her to call back if we can be of any further assistance.   Advised pt of this conversation and she is appreciative.

## 2017-12-08 NOTE — Patient Instructions (Signed)
Please take extra 1/2 dose of coumadin tonight, then continue dosage of 3mg  (1/2 tablet) daily, except 6mg  on Wednesdays and Saturdays.  Recheck 2 weeks.

## 2017-12-13 NOTE — Progress Notes (Deleted)
Cardiology Office Note  Date:  12/13/2017   ID:  [name], DOB March 17, 1935, MRN [id number]  PCP:  Rusty Aus, MD   No chief complaint on file.   HPI:  [name] a pleasant 83 year old woman with history of  paroxysmal atrial fibrillation,  prior cardioversion April 2017 while on amiodarone,  mitral valve replacement with mechanical valve,  redo 2016  on chronic anticoagulation,  Ejection fraction 40-45% April 2017 chronic renal insufficiency,  vertebral fractures with kyphoplasty,  lymphedema who uses lymphedema compression pumps  On warfarin, managed through our office who presents for follow-up of her paroxysmal atrial fibrillation  Weight at home 146 dressed This is down 2 pounds from her last clinic visit but up from her prior office visits 138 pounds Still has abdominal bloating, leg edema  kyphoplasty previously scheduled on 09/04/17 was delayed for fluid retention, shortness of breath  High fluid intake soda, coffee, water, tea Daughter feels she was doing better on Lasix 60 daily (20 mg 3 times daily) Not as good on the torsemide Had to climb in creatinine up to 1.8 on torsemide 40 twice daily last week Dose of torsemide decreased back to 20 twice a day  Currently lives with her daughter who does all of her medications Limited by chronic back pain, worse recently  EKG personally reviewed by myself on todays visit Shows sinus rhythm with rate 59 bpm intraventricular conduction delay, left anterior fascicular block  Echocardiogram March 2019 Left ventricle: The cavity size was normal. There was mild concentric hypertrophy. Systolic function was moderately reduced. The estimated ejection fraction was in the range of 35% to 40%. (Previous ejection fraction in April 2017 was 40-45%) - Aortic valve: There was mild to moderate stenosis. Mean gradient   (S): 10 mm Hg. Valve area (VTI): 1.17 cm^2. - Mitral valve: A mechanical prosthesis was present and  functioning   normally. Mean gradient (D): 3 mm Hg. - Left atrium: The atrium was mildly dilated. - Pulmonary arteries: Systolic pressure was mildly increased. PA   peak pressure: 40 mm Hg (S).  Other past medical history reviewed Reports heart rhythm well controlled on low-dose amiodarone 200 mg daily Previously concerned about hair loss on amiodarone. This has been stable  History of back surgery [place] over the summer 2017: vertebrae trauma, fell backwards She underwent kyphoplasty twice, in October November 2017 with 6 total kyphoplasty's, ranging L2-L5, S1, T6, T7. She had a very bad experience on her first surgery   hospital admission April 2017 with acute diastolic and systolic CHF, Found to have atrial fibrillation with RVR  Prior workup at [facility] March 2017 with echocardiogram at that time documenting ejection fraction 25% Right heart catheterization with normal filling pressures  Echo 09/2015: - Left ventricle: The cavity size was normal. There was mildconcentric hypertrophy. Systolic function was mildly tomoderately reduced. The estimated ejection fraction was in the range of 40% to 45%. Wall motion was normal; there were noregional wall motion abnormalities.  - Mitral valve: A mechanical prosthesis was present. Transvalvularvelocity was within the normal range. There was no evidence forstenosis. Peak gradient (D): 8 mm Hg. - Right ventricle: Systolic function was normal. - Tricuspid valve: There was moderate regurgitation. - Pulmonary arteries: Systolic pressure was mildly elevated. PA peak pressure: 43 mm Hg (S).   PMH:   has a past medical history of Arthritis, Chronic atrial fibrillation (HCC), Chronic back pain, Chronic combined systolic (congestive) and diastolic (congestive) heart failure ([place]), Chronic kidney disease, Compression  fracture, GERD (gastroesophageal reflux disease), Hypertension, Lymphedema, S/P MVR (mitral valve replacement), and Shortness  of breath dyspnea.  PSH:    Past Surgical History:  Procedure Laterality Date  . BREAST LUMPECTOMY Right 2005?   benign lump excision  . CARDIAC CATHETERIZATION    . Echo, 2016   "MVR; MVR"  . CATARACT EXTRACTION W/PHACO Right 02/20/2015   Procedure: CATARACT EXTRACTION PHACO AND INTRAOCULAR LENS PLACEMENT (IOC);  Surgeon: Estill Cotta, MD;  Location: [facility] ORS;  Service: Ophthalmology;  Laterality: Right;  US01:31.2 AP 25.3% CDE40.13 Fluid lot # [id number]  . ELECTROPHYSIOLOGIC STUDY N/A 10/09/2015   Procedure: CARDIOVERSION;  Surgeon: Wellington Hampshire, MD;  Location: [facility] ORS;  Service: Cardiovascular;  Laterality: N/A;  . IR GENERIC HISTORICAL  04/01/2016   IR RADIOLOGIST EVAL & MGMT 04/01/2016 [facility]  . IR GENERIC HISTORICAL  04/15/2016   IR VERTEBROPLASTY CERV/THOR BX INC UNI/BIL INC/INJECT/IMAGING 04/15/2016 Luanne Bras, MD [facility]  . IR GENERIC HISTORICAL  04/15/2016   IR VERTEBROPLASTY EA ADDL (T&LS) BX INC UNI/BIL INC INJECT/IMAGING 04/15/2016 Luanne Bras, MD [facility]  . IR GENERIC HISTORICAL  04/15/2016   IR VERTEBROPLASTY EA ADDL (T&LS) BX INC UNI/BIL INC INJECT/IMAGING 04/15/2016 Luanne Bras, MD [facility]  . IR GENERIC HISTORICAL  05/20/2016   IR RADIOLOGIST EVAL & MGMT 05/20/2016 [facility]  . IR GENERIC HISTORICAL  06/13/2016   IR VERTEBROPLASTY EA ADDL (T&LS) BX INC UNI/BIL INC INJECT/IMAGING 06/13/2016 Luanne Bras, MD [facility]  . IR GENERIC HISTORICAL  06/13/2016   IR SACROPLASTY BILATERAL 06/13/2016 Luanne Bras, MD [facility]  . IR GENERIC HISTORICAL  06/13/2016   IR VERTEBROPLASTY LUMBAR BX INC UNI/BIL INC/INJECT/IMAGING 06/13/2016 Luanne Bras, MD [facility]  . IRRIGATION AND DEBRIDEMENT HEMATOMA Left 07/05/2015   Procedure: IRRIGATION AND DEBRIDEMENT HEMATOMA;  Surgeon: Robert Bellow, MD;  Location: [facility] ORS;  Service: General;  Laterality: Left;  . pyloric stenosis   07/1935  . US ECHOCARDIOGRAPHY      Current Outpatient Medications  Medication Sig Dispense Refill  . acetaminophen (TYLENOL) 650 MG CR tablet Take 650 mg by mouth every 8 (eight) hours as needed for pain.    Marland Kitchen amiodarone (PACERONE) 200 MG tablet TAKE 1 TABLET BY MOUTH DAILY 90 tablet 3  . aspirin EC 81 MG tablet Take 81 mg by mouth daily.     . Cholecalciferol (VITAMIN D) 2000 units tablet Take 2,000 Units by mouth daily.    . furosemide (LASIX) 20 MG tablet Take 1 tablet (20 mg total) by mouth 3 (three) times daily. 270 tablet 3  . KLOR-CON 10 10 MEQ tablet Take 10 mEq by mouth daily.     Marland Kitchen losartan (COZAAR) 25 MG tablet Take 0.5 tablets (12.5 mg total) by mouth daily. 30 tablet 3  . Menthol (ICY HOT) 5 % PTCH Apply 1 patch topically daily as needed (pain).    Marland Kitchen metolazone (ZAROXOLYN) 5 MG tablet Take 1 tablet (5 mg total) by mouth as needed (As needed for weight greater than 148). 90 tablet 3  . Multiple Vitamin (MULTIVITAMIN WITH MINERALS) TABS tablet Take 1 tablet by mouth daily.    . Probiotic Product (RISA-BID PROBIOTIC PO) Take 1 capsule by mouth daily.     . ranitidine (ZANTAC) 150 MG tablet Take 300 mg by mouth at bedtime.    Marland Kitchen spironolactone (ALDACTONE) 25 MG tablet Take 25 mg by mouth daily.     . vitamin C (ASCORBIC ACID) 500 MG tablet  Take 500 mg by mouth daily.    Marland Kitchen warfarin (COUMADIN) 6 MG tablet Take 3-6 mg by mouth See admin instructions. Take 3 mg once daily at night except for Wednesdays take 6 mg once nightly     No current facility-administered medications for this visit.      Allergies:   Ace inhibitors; Hydrocodone; Mercury; Silver; Cephalexin; Clindamycin/lincomycin; Nickel; and Penicillins   Social History:  The patient  reports that she has quit smoking. Her smoking use included cigarettes. She has a 27.00 pack-year smoking history. She has never used smokeless tobacco. She reports that she drank alcohol. She reports that she does not use drugs.   Family  History:   family history includes Renal Disease in her father; Stroke in her mother.    Review of Systems: Review of Systems  Constitutional: Negative.   Respiratory: Negative.   Cardiovascular: Negative.   Gastrointestinal: Negative.   Musculoskeletal: Positive for back pain.       Unsteady gait  Neurological: Negative.   Psychiatric/Behavioral: Negative.   All other systems reviewed and are negative.    PHYSICAL EXAM: VS:  There were no vitals taken for this visit. , BMI There is no height or weight on file to calculate BMI. Constitutional:  oriented to person, place, and time. No distress.  Significant kyphosis, walking with a walker  HENT:  Head: Normocephalic and atraumatic.  Eyes:  no discharge. No scleral icterus.  Neck: Normal range of motion. Neck supple. No JVD present.  Cardiovascular: Normal rate, regular rhythm, normal heart sounds and intact distal pulses. Exam reveals no gallop and no friction rub.  Trace  edema to below the knees bilaterally, compression hose in place No murmur heard. Pulmonary/Chest: Effort normal and breath sounds normal. No stridor. No respiratory distress.  no wheezes.  no rales.  no tenderness.  Abdominal: Soft.  no distension.  no tenderness.  Musculoskeletal: Normal range of motion.  no  tenderness or deformity.  Neurological:  normal muscle tone. Coordination normal. No atrophy Skin: Skin is warm and dry. No rash noted. not diaphoretic.  Psychiatric:  normal mood and affect. behavior is normal. Thought content normal.      Recent Labs: 09/29/2017: BUN 55; Creatinine, Ser 1.82; Potassium 5.5; Sodium 139    Lipid Panel No results found for: CHOL, HDL, LDLCALC, TRIG    Wt Readings from Last 3 Encounters:  10/27/17 150 lb 6 oz (68.2 kg)  10/20/17 150 lb (68 kg)  09/29/17 148 lb (67.1 kg)       ASSESSMENT AND PLAN:  Paroxysmal atrial fibrillation (HCC) - Plan: EKG 12-Lead maintaining normal sinus rhythm Continue  amiodarone  200 mg daily history of paroxysmal atrial fibrillation and flutter  goal INR 2.5 or higher Last INR 3.01-week ago  Typical atrial flutter ([place]) - Plan: EKG 12-Lead Normal sinus rhythm restored. Previously noted on EKG  in 2017 Stable on today's visit  Systolic and diastolic CHF, acute on chronic (HCC) Baseline weight likely  138 pounds range Currently 146 pounds Higher creatinine on torsemide 40 twice daily with not much weight loss High fluid intake Did not reach 140 pounds as we had hoped on the torsemide At the decrease the dose back to 20 twice daily for creatinine close to 1.81 baseline is 1.4 less Daughter requesting we go back to Lasix 20 3 times daily with occasional metolazone use Previously discussed changing her losartan to [employer].  She was reluctant to change medications at this time especially to a  non-generic Instructions provided as below: Hold the torsemide Restart lasix 20 mg three times a day Take metolazone 2.5 mg as needed for weight > than 148 pounds For home weight >146, take lasix 4 of the 20 mg pills through the day We will request in-home IV Lasix protocol, with use of IV Lasix for weight 149 pounds or if she has worsening symptoms leg swelling abdominal bloating shortness of breath  Mixed hyperlipidemia Currently not on a statin Monitored by primary care Will defer to their office  H/O mitral valve replacement with mechanical valve Echocardiogram 2019 with well functioning mitral valve We will take over warfarin management given subtherapeutic for 3 months INR is 3.0 Stable for now  Chronic back pain Very deconditioned, not exercising walking for the past several months Severe kyphosis, patient requesting to have back surgery She would be acceptable but moderate risk for surgery,  Would recommend minimal use of IV fluids given chronic diastolic CHF Once she is ready she will need Lovenox bridge to surgery before and after given mechanical valve    Total encounter time more than 45 minutes  Greater than 50% was spent in counseling and coordination of care with the patient  Disposition:   F/U  3 months   No orders of the defined types were placed in this encounter.    Signed, Esmond Plants, M.D., Ph.D. 12/13/2017  [place], [place]

## 2017-12-15 ENCOUNTER — Ambulatory Visit (INDEPENDENT_AMBULATORY_CARE_PROVIDER_SITE_OTHER): Payer: Medicare Other

## 2017-12-15 DIAGNOSIS — I480 Paroxysmal atrial fibrillation: Secondary | ICD-10-CM

## 2017-12-15 DIAGNOSIS — Z5181 Encounter for therapeutic drug level monitoring: Secondary | ICD-10-CM | POA: Diagnosis not present

## 2017-12-15 DIAGNOSIS — Z952 Presence of prosthetic heart valve: Secondary | ICD-10-CM | POA: Diagnosis not present

## 2017-12-15 LAB — POCT INR: INR: 2.3 (ref 2.0–3.0)

## 2017-12-15 MED ORDER — DIPHENHYDRAMINE HCL 25 MG PO CAPS
25.00 | ORAL_CAPSULE | ORAL | Status: DC
Start: ? — End: 2017-12-15

## 2017-12-15 MED ORDER — RANITIDINE HCL 150 MG PO TABS
150.00 | ORAL_TABLET | ORAL | Status: DC
Start: 2017-12-14 — End: 2017-12-15

## 2017-12-15 MED ORDER — ACETAMINOPHEN 325 MG PO TABS
650.00 | ORAL_TABLET | ORAL | Status: DC
Start: ? — End: 2017-12-15

## 2017-12-15 MED ORDER — ASPIRIN EC 81 MG PO TBEC
81.00 | DELAYED_RELEASE_TABLET | ORAL | Status: DC
Start: 2017-12-14 — End: 2017-12-15

## 2017-12-15 MED ORDER — AMIODARONE HCL 200 MG PO TABS
200.00 | ORAL_TABLET | ORAL | Status: DC
Start: 2017-12-14 — End: 2017-12-15

## 2017-12-15 MED ORDER — FUROSEMIDE 20 MG PO TABS
20.00 | ORAL_TABLET | ORAL | Status: DC
Start: 2017-12-13 — End: 2017-12-15

## 2017-12-15 MED ORDER — GENERIC EXTERNAL MEDICATION
1.00 | Status: DC
Start: ? — End: 2017-12-15

## 2017-12-15 MED ORDER — FUROSEMIDE 40 MG PO TABS
40.00 | ORAL_TABLET | ORAL | Status: DC
Start: 2017-12-14 — End: 2017-12-15

## 2017-12-15 MED ORDER — FUROSEMIDE 20 MG PO TABS
20.00 | ORAL_TABLET | ORAL | Status: DC
Start: 2017-12-14 — End: 2017-12-15

## 2017-12-15 MED ORDER — LOSARTAN POTASSIUM 25 MG PO TABS
12.50 | ORAL_TABLET | ORAL | Status: DC
Start: 2017-12-14 — End: 2017-12-15

## 2017-12-15 MED ORDER — SPIRONOLACTONE 25 MG PO TABS
25.00 | ORAL_TABLET | ORAL | Status: DC
Start: 2017-12-14 — End: 2017-12-15

## 2017-12-15 MED ORDER — FEXOFENADINE HCL 60 MG PO TABS
60.00 | ORAL_TABLET | ORAL | Status: DC
Start: 2017-12-14 — End: 2017-12-15

## 2017-12-15 NOTE — Patient Instructions (Signed)
Please take extra 1/2 dose of coumadin tonight, then continue dosage of 3mg  (1/2 tablet) daily, except 6mg  on Wednesdays and Saturdays.  Recheck 2 weeks.

## 2017-12-16 ENCOUNTER — Ambulatory Visit: Payer: Medicare Other | Admitting: Cardiovascular Disease

## 2017-12-24 ENCOUNTER — Ambulatory Visit (INDEPENDENT_AMBULATORY_CARE_PROVIDER_SITE_OTHER): Payer: Medicare Other

## 2017-12-24 DIAGNOSIS — I480 Paroxysmal atrial fibrillation: Secondary | ICD-10-CM | POA: Diagnosis not present

## 2017-12-24 DIAGNOSIS — Z5181 Encounter for therapeutic drug level monitoring: Secondary | ICD-10-CM

## 2017-12-24 DIAGNOSIS — Z952 Presence of prosthetic heart valve: Secondary | ICD-10-CM | POA: Diagnosis not present

## 2017-12-24 LAB — POCT INR: INR: 2.8 (ref 2.0–3.0)

## 2017-12-24 NOTE — Patient Instructions (Signed)
Please continue dosage of 3mg  (1/2 tablet) daily, except 6 mg on Wednesdays and Saturdays.  Recheck on Monday @ your appt w/ Christell Faith, PA.

## 2017-12-29 ENCOUNTER — Ambulatory Visit: Payer: Medicare Other | Admitting: Physician Assistant

## 2017-12-29 ENCOUNTER — Ambulatory Visit (INDEPENDENT_AMBULATORY_CARE_PROVIDER_SITE_OTHER): Payer: Medicare Other

## 2017-12-29 DIAGNOSIS — I480 Paroxysmal atrial fibrillation: Secondary | ICD-10-CM

## 2017-12-29 DIAGNOSIS — Z5181 Encounter for therapeutic drug level monitoring: Secondary | ICD-10-CM

## 2017-12-29 DIAGNOSIS — Z952 Presence of prosthetic heart valve: Secondary | ICD-10-CM | POA: Diagnosis not present

## 2017-12-29 LAB — POCT INR: INR: 2.7 (ref 2.0–3.0)

## 2017-12-29 NOTE — Patient Instructions (Signed)
Please continue dosage of 3mg  (1/2 tablet) daily, except 6 mg on Wednesdays and Saturdays.  Recheck in 3 weeks.

## 2018-01-19 ENCOUNTER — Ambulatory Visit (INDEPENDENT_AMBULATORY_CARE_PROVIDER_SITE_OTHER): Payer: Medicare Other

## 2018-01-19 DIAGNOSIS — Z5181 Encounter for therapeutic drug level monitoring: Secondary | ICD-10-CM

## 2018-01-19 DIAGNOSIS — Z952 Presence of prosthetic heart valve: Secondary | ICD-10-CM | POA: Diagnosis not present

## 2018-01-19 DIAGNOSIS — I480 Paroxysmal atrial fibrillation: Secondary | ICD-10-CM | POA: Diagnosis not present

## 2018-01-19 LAB — POCT INR: INR: 3.2 — AB (ref 2.0–3.0)

## 2018-01-19 NOTE — Patient Instructions (Signed)
Please continue dosage of 3mg  (1/2 tablet) daily, except 6 mg on Wednesdays and Saturdays.  Recheck in 4 weeks.

## 2018-02-04 ENCOUNTER — Telehealth (INDEPENDENT_AMBULATORY_CARE_PROVIDER_SITE_OTHER): Payer: Self-pay

## 2018-02-04 NOTE — Telephone Encounter (Signed)
Patient son called stating that his mom has a left leg injury/cut from her walker,I spoke with KS and she advise that the patient should be seen in the office.

## 2018-02-12 ENCOUNTER — Ambulatory Visit (INDEPENDENT_AMBULATORY_CARE_PROVIDER_SITE_OTHER): Payer: Medicare Other | Admitting: Vascular Surgery

## 2018-02-18 ENCOUNTER — Other Ambulatory Visit: Payer: Self-pay | Admitting: Physician Assistant

## 2018-02-18 ENCOUNTER — Ambulatory Visit (INDEPENDENT_AMBULATORY_CARE_PROVIDER_SITE_OTHER): Payer: Medicare Other

## 2018-02-18 DIAGNOSIS — I480 Paroxysmal atrial fibrillation: Secondary | ICD-10-CM

## 2018-02-18 DIAGNOSIS — Z5181 Encounter for therapeutic drug level monitoring: Secondary | ICD-10-CM

## 2018-02-18 DIAGNOSIS — Z952 Presence of prosthetic heart valve: Secondary | ICD-10-CM

## 2018-02-18 LAB — POCT INR: INR: 2.2 (ref 2.0–3.0)

## 2018-02-18 NOTE — Telephone Encounter (Signed)
Spoke with patient reference refill request for losartan. She has been seeing [facility] since her pacemaker and is being seen in the coumadin clinic [facility]. She has made and canceled multiple appointments with this office. Advised she will need to be seen in this office to continue getting refills.

## 2018-02-18 NOTE — Patient Instructions (Signed)
Please START DOSAGE of 3mg  (1/2 tablet) daily, EXCEPT WHOLE PILL ON MONDAYS, WEDNESDAYS, & FRIDAYS. Recheck in 4 weeks.

## 2018-02-24 ENCOUNTER — Ambulatory Visit: Payer: Medicare Other | Admitting: Physician Assistant

## 2018-03-05 ENCOUNTER — Ambulatory Visit (INDEPENDENT_AMBULATORY_CARE_PROVIDER_SITE_OTHER): Payer: Medicare Other | Admitting: Vascular Surgery

## 2018-03-05 ENCOUNTER — Encounter (INDEPENDENT_AMBULATORY_CARE_PROVIDER_SITE_OTHER): Payer: Self-pay | Admitting: Vascular Surgery

## 2018-03-05 VITALS — BP 124/61 | HR 70 | Resp 16 | Ht 64.0 in | Wt 148.2 lb

## 2018-03-05 DIAGNOSIS — E782 Mixed hyperlipidemia: Secondary | ICD-10-CM

## 2018-03-05 DIAGNOSIS — I890 Lymphedema, not elsewhere classified: Secondary | ICD-10-CM

## 2018-03-05 DIAGNOSIS — I5022 Chronic systolic (congestive) heart failure: Secondary | ICD-10-CM | POA: Diagnosis not present

## 2018-03-05 DIAGNOSIS — I872 Venous insufficiency (chronic) (peripheral): Secondary | ICD-10-CM

## 2018-03-05 NOTE — Progress Notes (Signed)
MRN : [id number]  [name] is a 83 y.o. (1934/12/25) female who presents with chief complaint of No chief complaint on file. Marland Kitchen  History of Present Illness:  The patient returns to the office for followup evaluation regarding leg swelling.  The swelling has persisted and the pain associated with swelling continues. There have not been any interval development of a ulcerations or wounds.  Since the previous visit the patient has been wearing graduated compression stockings and has noted little if any improvement in the lymphedema. The patient has been using compression routinely morning until night.  The patient also states elevation during the day and exercise is being done too.   No outpatient medications have been marked as taking for the 03/05/18 encounter (Appointment) with Delana Meyer, Dolores Lory, MD.    Past Medical History:  Diagnosis Date  . Arthritis    "back, hands, knees" (04/10/2016)  . Chronic atrial fibrillation ([place])    a. On Coumadin  . Chronic back pain   . 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 EF 25% by echo in 08/2015 b. RHC in 08/2015 showed normal filling pressures  . Chronic kidney disease   . Compression fracture    "several; all in my back" (04/10/2016)  . GERD (gastroesophageal reflux disease)   . Hypertension   . Lymphedema   . S/P MVR (mitral valve replacement)    a. MVR 1994 b. redo MVR in 08/2014 - on Coumadin  . Shortness of breath dyspnea     Past Surgical History:  Procedure Laterality Date  . BREAST LUMPECTOMY Right 2005?   benign lump excision  . CARDIAC CATHETERIZATION    . [place], 2016   "MVR; MVR"  . CATARACT EXTRACTION W/PHACO Right 02/20/2015   Procedure: CATARACT EXTRACTION PHACO AND INTRAOCULAR LENS PLACEMENT (IOC);  Surgeon: Estill Cotta, MD;  Location: [facility];  Service: Ophthalmology;  Laterality: Right;  US01:31.2 AP  25.3% CDE40.13 Fluid lot # [id number]  . ELECTROPHYSIOLOGIC STUDY N/A 10/09/2015   Procedure: CARDIOVERSION;  Surgeon: Wellington Hampshire, MD;  Location: [facility];  Service: Cardiovascular;  Laterality: N/A;  . IR GENERIC HISTORICAL  04/01/2016   IR RADIOLOGIST EVAL & MGMT 04/01/2016 [facility]  . IR GENERIC HISTORICAL  04/15/2016   IR VERTEBROPLASTY CERV/THOR BX INC UNI/BIL INC/INJECT/IMAGING 04/15/2016 Luanne Bras, MD [facility]  . IR GENERIC HISTORICAL  04/15/2016   IR VERTEBROPLASTY EA ADDL (T&LS) BX INC UNI/BIL INC INJECT/IMAGING 04/15/2016 Luanne Bras, MD [facility]  . IR GENERIC HISTORICAL  04/15/2016   IR VERTEBROPLASTY EA ADDL (T&LS) BX INC UNI/BIL INC INJECT/IMAGING 04/15/2016 Luanne Bras, MD [facility]  . IR GENERIC HISTORICAL  05/20/2016   IR RADIOLOGIST EVAL & MGMT 05/20/2016 [facility]  . IR GENERIC HISTORICAL  06/13/2016   IR VERTEBROPLASTY EA ADDL (T&LS) BX INC UNI/BIL INC INJECT/IMAGING 06/13/2016 Luanne Bras, MD [facility]  . IR GENERIC HISTORICAL  06/13/2016   IR SACROPLASTY BILATERAL 06/13/2016 Luanne Bras, MD [facility]  . IR GENERIC HISTORICAL  06/13/2016   IR VERTEBROPLASTY LUMBAR BX INC UNI/BIL INC/INJECT/IMAGING 06/13/2016 Luanne Bras, MD [facility]  . IRRIGATION AND DEBRIDEMENT HEMATOMA Left 07/05/2015   Procedure: IRRIGATION AND DEBRIDEMENT HEMATOMA;  Surgeon: Robert Bellow, MD;  Location: [facility];  Service: General;  Laterality: Left;  . pyloric stenosis  07/1937  . US ECHOCARDIOGRAPHY      Social History Social History   Tobacco Use  .  Smoking status: Former Smoker    Packs/day: 1.00    Years: 27.00    Pack years: 27.00    Types: Cigarettes  . Smokeless tobacco: Never Used  . Tobacco comment: "quit smoking ~ 1980  Substance Use Topics  . Alcohol use: Not Currently    Alcohol/week: 0.0 standard drinks    Comment: 04/10/2016 "I'll have a drink on holidays/special occasions"  . Drug use: No     Family History Family History  Problem Relation Age of Onset  . Stroke Mother   . Renal Disease Father     Allergies  Allergen Reactions  . Ace Inhibitors     Cough  . Hydrocodone Itching and Nausea Only  . Mercury     Other reaction(s): Unknown  . Silver Dermatitis    Severe itching  . Cephalexin Rash  . Clindamycin/Lincomycin Rash  . Nickel Rash  . Penicillins Rash    Has patient had a PCN reaction causing immediate rash, facial/tongue/throat swelling, SOB or lightheadedness with hypotension: YES Has patient had a PCN reaction causing severe rash involving mucus membranes or skin necrosis: NO Has patient had a PCN reaction that required hospitalization NO Has patient had a PCN reaction occurring within the last 10 years: NO If all of the above answers are "NO", then may proceed with Cephalosporin use.     REVIEW OF SYSTEMS (Negative unless checked)  Constitutional: [] Weight loss  [] Fever  [] Chills Cardiac: [] Chest pain   [] Chest pressure   [] Palpitations   [] Shortness of breath when laying flat   [] Shortness of breath with exertion. Vascular:  [] Pain in legs with walking   [x] Pain in legs at rest  [] History of DVT   [] Phlebitis   [x] Swelling in legs   [] Varicose veins   [] Non-healing ulcers Pulmonary:   [] Uses home oxygen   [] Productive cough   [] Hemoptysis   [] Wheeze  [] COPD   [] Asthma Neurologic:  [] Dizziness   [] Seizures   [] History of stroke   [] History of TIA  [] Aphasia   [] Vissual changes   [] Weakness or numbness in arm   [] Weakness or numbness in leg Musculoskeletal:   [] Joint swelling   [] Joint pain   [] Low back pain Hematologic:  [] Easy bruising  [] Easy bleeding   [] Hypercoagulable state   [] Anemic Gastrointestinal:  [] Diarrhea   [] Vomiting  [] Gastroesophageal reflux/heartburn   [] Difficulty swallowing. Genitourinary:  [] Chronic kidney disease   [] Difficult urination  [] Frequent urination   [] Blood in urine Skin:  [x] Rashes   [] Ulcers  Psychological:   [] History of anxiety   []  History of major depression.  Physical Examination  There were no vitals filed for this visit. There is no height or weight on file to calculate BMI. Gen: WD/WN, NAD Head: [place]/AT, No temporalis wasting.  Ear/Nose/Throat: Hearing grossly intact, nares w/o erythema or drainage Eyes: PER, EOMI, sclera nonicteric.  Neck: Supple, no large masses.   Pulmonary:  Good air movement, no audible wheezing bilaterally, no use of accessory muscles.  Cardiac: RRR, no JVD Vascular: Scattered varicosities present extensively.  Severe venous stasis changes to the legs bilaterally.  3-4+ soft pitting edema Vessel Right Left  Radial Palpable Palpable  PT Palpable Palpable  DP Palpable Palpable  Gastrointestinal: Non-distended. No guarding/no peritoneal signs.  Musculoskeletal: M/S 5/5 throughout.  No deformity or atrophy.  Neurologic: CN 2-12 intact. Symmetrical.  Speech is fluent. Motor exam as listed above. Psychiatric: Judgment intact, Mood & affect appropriate for pt's clinical situation. Dermatologic: Severe venous rashes no ulcers noted.  No  changes consistent with cellulitis. Lymph : No lichenification or skin changes of chronic lymphedema.  CBC Lab Results  Component Value Date   WBC 3.7 07/01/2016   HGB 11.7 (L) 07/01/2016   HCT 35.5 07/01/2016   MCV 82.3 07/01/2016   PLT 220 07/01/2016    BMET    Component Value Date/Time   NA 139 09/29/2017 1214   NA 143 03/27/2012 0418   K 5.5 (H) 09/29/2017 1214   K 3.9 02/20/2015 0630   CL 96 09/29/2017 1214   CL 111 (H) 03/27/2012 0418   CO2 19 (L) 09/29/2017 1214   CO2 21 03/27/2012 0418   GLUCOSE 76 09/29/2017 1214   GLUCOSE 105 (H) 09/01/2017 1218   GLUCOSE 96 03/27/2012 0418   BUN 55 (H) 09/29/2017 1214   BUN 10 03/27/2012 0418   CREATININE 1.82 (H) 09/29/2017 1214   CREATININE 0.84 03/27/2012 0418   CALCIUM 9.8 09/29/2017 1214   CALCIUM 8.3 (L) 03/27/2012 0418   GFRNONAA 26 (L) 09/29/2017 1214    GFRNONAA >60 03/27/2012 0418   GFRAA 30 (L) 09/29/2017 1214   GFRAA >60 03/27/2012 0418   CrCl cannot be calculated (Patient's most recent lab result is older than the maximum 21 days allowed.).  COAG Lab Results  Component Value Date   INR 2.2 02/18/2018   INR 3.2 (A) 01/19/2018   INR 2.7 12/29/2017    Radiology No results found.   Assessment/Plan 1. Chronic venous insufficiency No surgery or intervention at this point in time.   I have had a long discussion with the patient regarding venous insufficiency and why it causes symptoms. I have discussed with the patient the chronic skin changes that accompany venous insufficiency and the long term sequela such as infection and ulceration. Patient will begin wearing graduated compression stockings class 1 (20-30 mmHg) or compression wraps on a daily basis a prescription was given. The patient will put the stockings on first thing in the morning and removing them in the evening. The patient is instructed specifically not to sleep in the stockings.   In addition, behavioral modification including several periods of elevation of the lower extremities during the day will be continued. I have demonstrated that proper elevation is a position with the ankles at heart level.  The patient is instructed to begin routine exercise, especially walking on a daily basis  Following the review of the ultrasound the patient will follow up in 12 months to reassess the degree of swelling and the control that graduated compression stockings or compression wraps is offering.   2. Lymphedema No surgery or intervention at this point in time.   I have reviewed my discussion with the patient regarding lymphedema and why it causes symptoms. Patient will continue wearing graduated compression stockings class 1 (20-30 mmHg) on a daily basis a prescription was given. The patient is reminded to put the stockings on first thing in the morning and  removing them in the evening. The patient is instructed specifically not to sleep in the stockings.   In addition, behavioral modification throughout the day will be continued. This will include frequent elevation (such as in a recliner), use of over the counter pain medications as needed and exercise such as walking.  I have reviewed systemic causes for chronic edema such as liver, kidney and cardiac etiologies and there does not appear to be any significant changes in these organ systems over the past year. The patient is under the impression that these organ systems are  all stable and unchanged.   The patient will continue aggressive use of the lymph pump. This will continue to improve the edema control and prevent sequela such as ulcers and infections.   The patient will follow-up with me on an annual basis.   3. Chronic systolic heart failure (HCC) Continue cardiac and antihypertensive medications as already ordered and reviewed, no changes at this time.  Continue statin as ordered and reviewed, no changes at this time  Nitrates PRN for chest pain  4. Mixed hyperlipidemia Continue statin as ordered and reviewed, no changes at this time   Hortencia Pilar, MD  03/05/2018 12:36 PM

## 2018-03-08 ENCOUNTER — Encounter (INDEPENDENT_AMBULATORY_CARE_PROVIDER_SITE_OTHER): Payer: Self-pay | Admitting: Vascular Surgery

## 2018-03-09 ENCOUNTER — Telehealth (INDEPENDENT_AMBULATORY_CARE_PROVIDER_SITE_OTHER): Payer: Self-pay | Admitting: Vascular Surgery

## 2018-03-09 NOTE — Telephone Encounter (Signed)
I spoke with Schnier and he advise for the patient to use bacitracin ointment for wound and also the patient son was informed with medical information

## 2018-03-09 NOTE — Telephone Encounter (Signed)
States mother was here last week and provider was to call in a  RX for a cream for her wound and it has not been called in. He is not sure of the name.

## 2018-03-16 NOTE — Progress Notes (Signed)
Cardiology Office Note  Date:  03/18/2018   ID:  [name], DOB [birth year], MRN [id number]  PCP:  Carrie Aus, MD   Chief Complaint  Patient presents with  . other    3 mo follow up. Medications verbally reviewed.     HPI:  [name] a pleasant 83 year old woman with history of  paroxysmal atrial fibrillation,  prior cardioversion April 2017 while on amiodarone,  mitral valve replacement with mechanical valve,  redo 2016  on chronic anticoagulation,  Ejection fraction 40-45% April 2017 chronic renal insufficiency,  vertebral fractures with kyphoplasty,  lymphedema who uses lymphedema compression pumps  On warfarin, managed through our office CRT-P in 11/2017 in the setting of LBBB (seen by Dr. Coy Saunas) who presents for follow-up of her paroxysmal atrial fibrillation  In follow-up today she reports that things have been relatively stable She feels weight at home has been stable Using metolazone more sometimes for several days in a row for ankle swelling Weight at home 144   Reports that she needs kyphoplasty for continued chronic back pain   previously scheduled on 09/04/17 was delayed for fluid retention, shortness of breath  Taking Lasix 60 daily (20 mg 3 times daily) Not as good on the torsemide Using metolazone frequently Recent lab work done through primary care CR baseline 1.7, now running 2.1 to 2.4 past 3 months Electrolytes stable  Lives at home, daughter there at night Presents today in a wheelchair  Long discussion concerning recent placement of CRT at [place] for ejection fraction 35 to 40% in the setting of left bundle branch block She is not sure if it has helped her symptoms  EKG personally reviewed by myself on todays visit Shows  rate 61 bpm paced rhythm  Other past medical history reviewed Echocardiogram March 2019 Left ventricle: The cavity size was normal. There was mild concentric hypertrophy. Systolic function was  moderately reduced. The estimated ejection fraction was in the range of 35% to 40%. (Previous ejection fraction in April 2017 was 40-45%) - Aortic valve: There was mild to moderate stenosis. Mean gradient   (S): 10 mm Hg. Valve area (VTI): 1.17 cm^2. - Mitral valve: A mechanical prosthesis was present and functioning   normally. Mean gradient (D): 3 mm Hg. - Left atrium: The atrium was mildly dilated. - Pulmonary arteries: Systolic pressure was mildly increased. PA   peak pressure: 40 mm Hg (S).  Reports heart rhythm well controlled on low-dose amiodarone 200 mg daily Previously concerned about hair loss on amiodarone. This has been stable  History of back surgery [place] over the summer 2017: vertebrae trauma, fell backwards She underwent kyphoplasty twice, in October November 2017 with 6 total kyphoplasty's, ranging L2-L5, S1, T6, T7. She had a very bad experience on her first surgery   hospital admission April 2017 with acute diastolic and systolic CHF, Found to have atrial fibrillation with RVR  [facility] 2017 with echocardiogram at that time documenting ejection fraction 25% Right heart catheterization with normal filling pressures   PMH:   has a past medical history of Arthritis, Chronic atrial fibrillation ([place]), Chronic back pain, Chronic combined systolic (congestive) and diastolic (congestive) heart failure (HCC), Chronic kidney disease, Compression fracture, GERD (gastroesophageal reflux disease), Hypertension, Lymphedema, S/P MVR (mitral valve replacement), and Shortness of breath dyspnea.  PSH:    Past Surgical History:  Procedure Laterality Date  . BREAST LUMPECTOMY Right 2005?   benign lump excision  . CARDIAC CATHETERIZATION    .  [place], 2016   "MVR; MVR"  . CATARACT EXTRACTION W/PHACO Right 02/20/2015   Procedure: CATARACT EXTRACTION PHACO AND INTRAOCULAR LENS PLACEMENT (IOC);  Surgeon: Estill Cotta, MD;   Location: [facility] OR[facility];  Service: Ophthalmology;  Laterality: Right;  US01:31.2 AP 25.3% CDE40.13 Fluid lot # [id number]  . ELECTROPHYSIOLOGIC STUDY N/A 10/09/2015   Procedure: CARDIOVERSION;  Surgeon: Wellington Hampshire, MD;  Location: [facility];  Service: Cardiovascular;  Laterality: N/A;  . IR GENERIC HISTORICAL  04/01/2016   IR RADIOLOGIST EVAL & MGMT 04/01/2016 [facility]  . IR GENERIC HISTORICAL  04/15/2016   IR VERTEBROPLASTY CERV/THOR BX INC UNI/BIL INC/INJECT/IMAGING 04/15/2016 Luanne Bras, MD [facility]  . IR GENERIC HISTORICAL  04/15/2016   IR VERTEBROPLASTY EA ADDL (T&LS) BX INC UNI/BIL INC INJECT/IMAGING 04/15/2016 Luanne Bras, MD [facility]  . IR GENERIC HISTORICAL  04/15/2016   IR VERTEBROPLASTY EA ADDL (T&LS) BX INC UNI/BIL INC INJECT/IMAGING 04/15/2016 Luanne Bras, MD [facility]  . IR GENERIC HISTORICAL  05/20/2016   IR RADIOLOGIST EVAL & MGMT 05/20/2016 [facility]  . IR GENERIC HISTORICAL  06/13/2016   IR VERTEBROPLASTY EA ADDL (T&LS) BX INC UNI/BIL INC INJECT/IMAGING 06/13/2016 Luanne Bras, MD [facility]  . IR GENERIC HISTORICAL  06/13/2016   IR SACROPLASTY BILATERAL 06/13/2016 Luanne Bras, MD [facility]  . IR GENERIC HISTORICAL  06/13/2016   IR VERTEBROPLASTY LUMBAR BX INC UNI/BIL INC/INJECT/IMAGING 06/13/2016 Luanne Bras, MD [facility]  . IRRIGATION AND DEBRIDEMENT HEMATOMA Left 07/05/2015   Procedure: IRRIGATION AND DEBRIDEMENT HEMATOMA;  Surgeon: Robert Bellow, MD;  Location: [facility];  Service: General;  Laterality: Left;  . pyloric stenosis  07/1937  . US ECHOCARDIOGRAPHY      Current Outpatient Medications  Medication Sig Dispense Refill  . acetaminophen (TYLENOL) 650 MG CR tablet Take 650 mg by mouth every 8 (eight) hours as needed for pain.    Marland Kitchen amiodarone (PACERONE) 200 MG tablet TAKE 1 TABLET BY MOUTH DAILY 90 tablet 3  . Cholecalciferol (VITAMIN D) 2000 units tablet Take 2,000 Units by mouth daily.    .  furosemide (LASIX) 20 MG tablet Take 1 tablet (20 mg total) by mouth 3 (three) times daily. 270 tablet 3  . KLOR-CON 10 10 MEQ tablet Take 10 mEq by mouth daily.     . Lidocaine 4 % PTCH Apply topically.    Marland Kitchen losartan (COZAAR) 25 MG tablet ONE-HALF TABLET BY MOUTH EVERY DAY 30 tablet 1  . Menthol (ICY HOT) 5 % PTCH Apply 1 patch topically daily as needed (pain).    . Multiple Vitamin (MULTIVITAMIN WITH MINERALS) TABS tablet Take 1 tablet by mouth daily.    . Probiotic Product (RISA-BID PROBIOTIC PO) Take 1 capsule by mouth daily.     . ranitidine (ZANTAC) 150 MG tablet Take 300 mg by mouth at bedtime.    . Simethicone (GAS-X PO) Take by mouth.    . spironolactone (ALDACTONE) 25 MG tablet Take 25 mg by mouth daily.     . vitamin C (ASCORBIC ACID) 500 MG tablet Take 500 mg by mouth daily.    Marland Kitchen warfarin (COUMADIN) 6 MG tablet Take 3-6 mg by mouth See admin instructions. Take 3 mg once daily at night except for Wednesdays take 6 mg once nightly    . metolazone (ZAROXOLYN) 5 MG tablet Take 1 tablet (5 mg total) by mouth as needed (As needed for weight greater than 148). 90 tablet 3   No current facility-administered medications for  this visit.      Allergies:   Ace inhibitors; Hydrocodone; Mercury; Silver; Cephalexin; Clindamycin/lincomycin; Nickel; and Penicillins   Social History:  The patient  reports that she has quit smoking. Her smoking use included cigarettes. She has a 27.00 pack-year smoking history. She has never used smokeless tobacco. She reports that she drank alcohol. She reports that she does not use drugs.   Family History:   family history includes Renal Disease in her father; Stroke in her mother.    Review of Systems: Review of Systems  Constitutional: Negative.   Respiratory: Negative.   Cardiovascular: Negative.   Gastrointestinal: Negative.   Musculoskeletal: Positive for back pain.       Unsteady gait  Neurological: Negative.   Psychiatric/Behavioral: Negative.    All other systems reviewed and are negative.   PHYSICAL EXAM: VS:  BP 128/66 (BP Location: Right Arm, Patient Position: Sitting, Cuff Size: Normal)   Pulse 61   Wt 145 lb 8 oz (66 kg)   BMI 24.98 kg/m  , BMI Body mass index is 24.98 kg/m.  No significant change in exam Constitutional:  oriented to person, place, and time. No distress.  Significant kyphosis, walking with a walker  HENT:  Head: Normocephalic and atraumatic.  Eyes:  no discharge. No scleral icterus.  Neck: Normal range of motion. Neck supple. No JVD present.  Cardiovascular: Normal rate, regular rhythm, normal heart sounds and intact distal pulses. Exam reveals no gallop and no friction rub.  Trace  edema to below the knees bilaterally, compression hose in place No murmur heard. Pulmonary/Chest: Effort normal and breath sounds normal. No stridor. No respiratory distress.  no wheezes.  no rales.  no tenderness.  Abdominal: Soft.  no distension.  no tenderness.  Musculoskeletal: Normal range of motion.  no  tenderness or deformity.  Neurological:  normal muscle tone. Coordination normal. No atrophy Skin: Skin is warm and dry. No rash noted. not diaphoretic.  Psychiatric:  normal mood and affect. behavior is normal. Thought content normal.    Recent Labs: 09/29/2017: BUN 55; Creatinine, Ser 1.82; Potassium 5.5; Sodium 139    Lipid Panel No results found for: CHOL, HDL, LDLCALC, TRIG    Wt Readings from Last 3 Encounters:  03/18/18 145 lb 8 oz (66 kg)  03/05/18 148 lb 3.2 oz (67.2 kg)  10/27/17 150 lb 6 oz (68.2 kg)       ASSESSMENT AND PLAN:  Paroxysmal atrial fibrillation (HCC) - Plan: EKG 12-Lead EKG concerning for atrial fibrillation, paced rhythm history of paroxysmal atrial fibrillation and flutter Does not appear to be symptomatic at this time On anticoagulation  goal INR 2.5 or higher Last INR 3.01-week ago  Typical atrial flutter ([place]) - Plan: EKG 12-Lead Previously noted on EKG  in  2017 Possibly again today, device was not interrogated Relatively asymptomatic  Systolic and diastolic CHF, acute on chronic (HCC) We had previously recommended algorithm below Restart lasix 20 mg three times a day Take metolazone 2.5 mg as needed for weight > than 148 pounds For home weight >146, take lasix 4 of the 20 mg pills through the day She has been taking metolazone not based off her weight but based off some ankle swelling Likely contributing to climb and creatinine well above her baseline Recommend she try to cut back on the metolazone and allow for some mild ankle swelling  Mixed hyperlipidemia Currently not on a statin  H/O mitral valve replacement with mechanical valve Echocardiogram 2019 with well functioning  mitral valve INR ranging 2.2 up to 4.3 today  Chronic back pain Severe kyphosis, patient requesting to have back surgery   Total encounter time more than 45 minutes  Greater than 50% was spent in counseling and coordination of care with the patient  Disposition:   F/U  6 months   Orders Placed This Encounter  Procedures  . EKG 12-Lead     Signed, Esmond Plants, M.D., Ph.D. 03/18/2018  [place], [place]

## 2018-03-18 ENCOUNTER — Encounter: Payer: Self-pay | Admitting: Cardiovascular Disease

## 2018-03-18 ENCOUNTER — Ambulatory Visit (INDEPENDENT_AMBULATORY_CARE_PROVIDER_SITE_OTHER): Payer: Medicare Other | Admitting: Cardiovascular Disease

## 2018-03-18 ENCOUNTER — Ambulatory Visit (INDEPENDENT_AMBULATORY_CARE_PROVIDER_SITE_OTHER): Payer: Medicare Other

## 2018-03-18 VITALS — BP 128/66 | HR 61 | Wt 145.5 lb

## 2018-03-18 DIAGNOSIS — N183 Chronic kidney disease, stage 3 unspecified: Secondary | ICD-10-CM

## 2018-03-18 DIAGNOSIS — E782 Mixed hyperlipidemia: Secondary | ICD-10-CM

## 2018-03-18 DIAGNOSIS — I480 Paroxysmal atrial fibrillation: Secondary | ICD-10-CM

## 2018-03-18 DIAGNOSIS — Z952 Presence of prosthetic heart valve: Secondary | ICD-10-CM

## 2018-03-18 DIAGNOSIS — R0602 Shortness of breath: Secondary | ICD-10-CM

## 2018-03-18 DIAGNOSIS — I890 Lymphedema, not elsewhere classified: Secondary | ICD-10-CM

## 2018-03-18 DIAGNOSIS — Z5181 Encounter for therapeutic drug level monitoring: Secondary | ICD-10-CM | POA: Diagnosis not present

## 2018-03-18 DIAGNOSIS — I5022 Chronic systolic (congestive) heart failure: Secondary | ICD-10-CM | POA: Diagnosis not present

## 2018-03-18 LAB — POCT INR: INR: 4.3 — AB (ref 2.0–3.0)

## 2018-03-18 NOTE — Patient Instructions (Signed)

## 2018-03-18 NOTE — Patient Instructions (Signed)
Please skip coumadin tonight, then resume dosage of 3mg  (1/2 tablet) daily, EXCEPT WHOLE PILL ON MONDAYS, WEDNESDAYS, & FRIDAYS. Recheck in 3 weeks.

## 2018-04-06 ENCOUNTER — Encounter (INDEPENDENT_AMBULATORY_CARE_PROVIDER_SITE_OTHER): Payer: Self-pay | Admitting: Vascular Surgery

## 2018-04-06 ENCOUNTER — Ambulatory Visit (INDEPENDENT_AMBULATORY_CARE_PROVIDER_SITE_OTHER): Payer: Medicare Other | Admitting: Vascular Surgery

## 2018-04-06 VITALS — BP 105/62 | HR 71 | Resp 16 | Ht 64.0 in | Wt 146.0 lb

## 2018-04-06 DIAGNOSIS — I480 Paroxysmal atrial fibrillation: Secondary | ICD-10-CM | POA: Diagnosis not present

## 2018-04-06 DIAGNOSIS — Z87891 Personal history of nicotine dependence: Secondary | ICD-10-CM

## 2018-04-06 DIAGNOSIS — L97329 Non-pressure chronic ulcer of left ankle with unspecified severity: Secondary | ICD-10-CM

## 2018-04-06 DIAGNOSIS — I872 Venous insufficiency (chronic) (peripheral): Secondary | ICD-10-CM

## 2018-04-06 DIAGNOSIS — I83023 Varicose veins of left lower extremity with ulcer of ankle: Secondary | ICD-10-CM

## 2018-04-06 DIAGNOSIS — I890 Lymphedema, not elsewhere classified: Secondary | ICD-10-CM | POA: Diagnosis not present

## 2018-04-06 NOTE — Progress Notes (Signed)
MRN : [id number]  [name] is a 83 y.o. ([birth year]) female who presents with chief complaint of  Chief Complaint  Patient presents with  . Follow-up    1 month no studies  .  History of Present Illness:   Patient is seen for follow up evaluation of leg pain and swelling associated with venous ulceration. The patient was recently seen here and started on Unna boot therapy.  The swelling abruptly became much worse bilaterally and is associated with pain and discoloration. The pain and swelling worsens with prolonged dependency and improves with elevation.  The patient notes that in the morning the legs are better but the leg symptoms worsened throughout the course of the day. The patient has also noted a progressive worsening of the discoloration in the ankle and shin area.   The patient notes that an ulcer has developed acutely without specific trauma and since it occurred it has been very slow to heal.  There is a moderate amount of drainage associated with the open area.  The wound is also very painful.  The patient notes that they were not able to tolerate the Unna boot and removed it several days ago.  The patient states that they have been elevating as much as possible. The patient denies any recent changes in medications.  The patient denies a history of DVT or PE. There is no prior history of phlebitis. There is no history of primary lymphedema.  No SOB or increased cough.  No sputum production.  No recent episodes of CHF exacerbation.   Current Meds  Medication Sig  . acetaminophen (TYLENOL) 650 MG CR tablet Take 650 mg by mouth every 8 (eight) hours as needed for pain.  Marland Kitchen amiodarone (PACERONE) 200 MG tablet TAKE 1 TABLET BY MOUTH DAILY  . calcium carbonate (TUMS EX) 750 MG chewable tablet Chew by mouth.  . Cholecalciferol (VITAMIN D) 2000 units tablet Take 2,000 Units by mouth daily.  Marland Kitchen doxycycline (VIBRA-TABS) 100 MG tablet   . furosemide (LASIX) 20 MG tablet  Take 1 tablet (20 mg total) by mouth 3 (three) times daily.  Marland Kitchen KLOR-CON 10 10 MEQ tablet Take 10 mEq by mouth daily.   . Lidocaine 4 % PTCH Apply topically.  Marland Kitchen losartan (COZAAR) 25 MG tablet ONE-HALF TABLET BY MOUTH EVERY DAY  . Menthol (ICY HOT) 5 % PTCH Apply 1 patch topically daily as needed (pain).  . Multiple Vitamin (MULTIVITAMIN WITH MINERALS) TABS tablet Take 1 tablet by mouth daily.  . Probiotic Product (RISA-BID PROBIOTIC PO) Take 1 capsule by mouth daily.   . ranitidine (ZANTAC) 150 MG tablet Take 300 mg by mouth at bedtime.  . Simethicone (GAS-X PO) Take by mouth.  . spironolactone (ALDACTONE) 25 MG tablet Take 25 mg by mouth daily.   . vitamin C (ASCORBIC ACID) 500 MG tablet Take 500 mg by mouth daily.  Marland Kitchen warfarin (COUMADIN) 6 MG tablet Take 3-6 mg by mouth See admin instructions. Take 3 mg once daily at night except for Wednesdays take 6 mg once nightly    Past Medical History:  Diagnosis Date  . Arthritis    "back, hands, knees" (04/10/2016)  . Chronic atrial fibrillation    a. On Coumadin  . Chronic back pain   . 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 EF 25% by echo in 08/2015 b. RHC in 08/2015 showed normal filling pressures  . Chronic kidney disease   .  Compression fracture    "several; all in my back" (04/10/2016)  . GERD (gastroesophageal reflux disease)   . Hypertension   . Lymphedema   . S/P MVR (mitral valve replacement)    a. MVR 1994 b. redo MVR in 08/2014 - on Coumadin  . Shortness of breath dyspnea     Past Surgical History:  Procedure Laterality Date  . BREAST LUMPECTOMY Right 2005?   benign lump excision  . CARDIAC CATHETERIZATION    . [place], 2016   "MVR; MVR"  . CATARACT EXTRACTION W/PHACO Right 02/20/2015   Procedure: CATARACT EXTRACTION PHACO AND INTRAOCULAR LENS PLACEMENT (IOC);  Surgeon: Estill Cotta, MD;  Location: [facility];  Service: Ophthalmology;   Laterality: Right;  US01:31.2 AP 25.3% CDE40.13 Fluid lot # [id number]  . ELECTROPHYSIOLOGIC STUDY N/A 10/09/2015   Procedure: CARDIOVERSION;  Surgeon: Wellington Hampshire, MD;  Location: [facility];  Service: Cardiovascular;  Laterality: N/A;  . IR GENERIC HISTORICAL  04/01/2016   IR RADIOLOGIST EVAL & MGMT 04/01/2016 [facility]  . IR GENERIC HISTORICAL  04/15/2016   IR VERTEBROPLASTY CERV/THOR BX INC UNI/BIL INC/INJECT/IMAGING 04/15/2016 Luanne Bras, MD [facility]  . IR GENERIC HISTORICAL  04/15/2016   IR VERTEBROPLASTY EA ADDL (T&LS) BX INC UNI/BIL INC INJECT/IMAGING 04/15/2016 Luanne Bras, MD [facility]  . IR GENERIC HISTORICAL  04/15/2016   IR VERTEBROPLASTY EA ADDL (T&LS) BX INC UNI/BIL INC INJECT/IMAGING 04/15/2016 Luanne Bras, MD [facility]  . IR GENERIC HISTORICAL  05/20/2016   IR RADIOLOGIST EVAL & MGMT 05/20/2016 [facility]  . IR GENERIC HISTORICAL  06/13/2016   IR VERTEBROPLASTY EA ADDL (T&LS) BX INC UNI/BIL INC INJECT/IMAGING 06/13/2016 Luanne Bras, MD [facility]  . IR GENERIC HISTORICAL  06/13/2016   IR SACROPLASTY BILATERAL 06/13/2016 Luanne Bras, MD [facility]  . IR GENERIC HISTORICAL  06/13/2016   IR VERTEBROPLASTY LUMBAR BX INC UNI/BIL INC/INJECT/IMAGING 06/13/2016 Luanne Bras, MD [facility]  . IRRIGATION AND DEBRIDEMENT HEMATOMA Left 07/05/2015   Procedure: IRRIGATION AND DEBRIDEMENT HEMATOMA;  Surgeon: Robert Bellow, MD;  Location: [facility];  Service: General;  Laterality: Left;  . pyloric stenosis  07/1937  . US ECHOCARDIOGRAPHY      Social History Social History   Tobacco Use  . Smoking status: Former Smoker    Packs/day: 1.00    Years: 27.00    Pack years: 27.00    Types: Cigarettes  . Smokeless tobacco: Never Used  . Tobacco comment: "quit smoking ~ 1980  Substance Use Topics  . Alcohol use: Not Currently    Alcohol/week: 0.0 standard drinks    Comment: 04/10/2016 "I'll have a drink on  holidays/special occasions"  . Drug use: No    Family History Family History  Problem Relation Age of Onset  . Stroke Mother   . Renal Disease Father     Allergies  Allergen Reactions  . Ace Inhibitors     Cough  . Doxycycline Nausea Only  . Hydrocodone Itching and Nausea Only  . Mercury     Other reaction(s): Unknown  . Silver Dermatitis    Severe itching  . Cephalexin Rash  . Clindamycin/Lincomycin Rash  . Nickel Rash  . Penicillins Rash    Has patient had a PCN reaction causing immediate rash, facial/tongue/throat swelling, SOB or lightheadedness with hypotension: YES Has patient had a PCN reaction causing severe rash involving mucus membranes or skin necrosis: NO Has patient had a PCN reaction that required hospitalization NO Has patient had a  PCN reaction occurring within the last 10 years: NO If all of the above answers are "NO", then may proceed with Cephalosporin use.     REVIEW OF SYSTEMS (Negative unless checked)  Constitutional: [] Weight loss  [] Fever  [] Chills Cardiac: [] Chest pain   [] Chest pressure   [] Palpitations   [] Shortness of breath when laying flat   [] Shortness of breath with exertion. Vascular:  [] Pain in legs with walking   [x] Pain in legs at rest  [] History of DVT   [] Phlebitis   [x] Swelling in legs   [] Varicose veins   [x] Non-healing ulcers Pulmonary:   [] Uses home oxygen   [] Productive cough   [] Hemoptysis   [] Wheeze  [] COPD   [] Asthma Neurologic:  [] Dizziness   [] Seizures   [] History of stroke   [] History of TIA  [] Aphasia   [] Vissual changes   [] Weakness or numbness in arm   [] Weakness or numbness in leg Musculoskeletal:   [] Joint swelling   [x] Joint pain   [] Low back pain Hematologic:  [] Easy bruising  [] Easy bleeding   [] Hypercoagulable state   [] Anemic Gastrointestinal:  [] Diarrhea   [] Vomiting  [] Gastroesophageal reflux/heartburn   [] Difficulty swallowing. Genitourinary:  [] Chronic kidney disease   [] Difficult urination  [] Frequent urination    [] Blood in urine Skin:  [x] Rashes   [x] Ulcers  Psychological:  [] History of anxiety   []  History of major depression.  Physical Examination  There were no vitals filed for this visit. There is no height or weight on file to calculate BMI. Gen: WD/WN, NAD Head: [place]/AT, No temporalis wasting.  Ear/Nose/Throat: Hearing grossly intact, nares w/o erythema or drainage Eyes: PER, EOMI, sclera nonicteric.  Neck: Supple, no large masses.   Pulmonary:  Good air movement, no audible wheezing bilaterally, no use of accessory muscles.  Cardiac: RRR, no JVD Vascular: 2-3+ edema of the left leg with severe venous changes of the left leg.  Venous ulcer noted in the ankle area on the left, noninfected Vessel Right Left  Radial Palpable Palpable  PT Palpable Palpable  DP Palpable Palpable  Gastrointestinal: Non-distended. No guarding/no peritoneal signs.  Musculoskeletal: M/S 5/5 throughout.  No deformity or atrophy.  Neurologic: CN 2-12 intact. Symmetrical.  Speech is fluent. Motor exam as listed above. Psychiatric: Judgment intact, Mood & affect appropriate for pt's clinical situation. Dermatologic: Venous stasis dermatitis with ulcers present on the left.  No changes consistent with cellulitis. Lymph : No lichenification or skin changes of chronic lymphedema.  CBC Lab Results  Component Value Date   WBC 3.7 07/01/2016   HGB 11.7 (L) 07/01/2016   HCT 35.5 07/01/2016   MCV 82.3 07/01/2016   PLT 220 07/01/2016    BMET    Component Value Date/Time   NA 139 09/29/2017 1214   NA 143 03/27/2012 0418   K 5.5 (H) 09/29/2017 1214   K 3.9 02/20/2015 0630   CL 96 09/29/2017 1214   CL 111 (H) 03/27/2012 0418   CO2 19 (L) 09/29/2017 1214   CO2 21 03/27/2012 0418   GLUCOSE 76 09/29/2017 1214   GLUCOSE 105 (H) 09/01/2017 1218   GLUCOSE 96 03/27/2012 0418   BUN 55 (H) 09/29/2017 1214   BUN 10 03/27/2012 0418   CREATININE 1.82 (H) 09/29/2017 1214   CREATININE 0.84 03/27/2012 0418   CALCIUM 9.8  09/29/2017 1214   CALCIUM 8.3 (L) 03/27/2012 0418   GFRNONAA 26 (L) 09/29/2017 1214   GFRNONAA >60 03/27/2012 0418   GFRAA 30 (L) 09/29/2017 1214   GFRAA >60 03/27/2012 2130  CrCl cannot be calculated (Patient's most recent lab result is older than the maximum 21 days allowed.).  COAG Lab Results  Component Value Date   INR 4.3 (A) 03/18/2018   INR 2.2 02/18/2018   INR 3.2 (A) 01/19/2018    Radiology No results found.   Assessment/Plan 1. Venous ulcer of ankle, left (HCC) No surgery or intervention at this point in time.    I have had a long discussion with the patient regarding venous insufficiency and why it  causes symptoms, specifically venous ulceration . I have discussed with the patient the chronic skin changes that accompany venous insufficiency and the long term sequela such as infection and recurring  ulceration.  Patient will be placed in [employer] which will be changed weekly drainage permitting.  In addition, behavioral modification including several periods of elevation of the lower extremities during the day will be continued. Achieving a position with the ankles at heart level was stressed to the patient  The patient is instructed to begin routine exercise, especially walking on a daily basis  The patient will follow up in 2 months to reassess the degree of swelling and the control that compression therapy is offering.   The patient should use the  Lymph Pump as much as possible   2. Lymphedema No surgery or intervention at this point in time.    I have had a long discussion with the patient regarding venous insufficiency and why it  causes symptoms. I have discussed with the patient the chronic skin changes that accompany venous insufficiency and the long term sequela such as infection and ulceration.  Patient will begin wearing graduated compression stockings class 1 (20-30 mmHg) or compression wraps on a daily basis a prescription was given. The patient will  put the stockings on first thing in the morning and removing them in the evening. The patient is instructed specifically not to sleep in the stockings.    In addition, behavioral modification including several periods of elevation of the lower extremities during the day will be continued. I have demonstrated that proper elevation is a position with the ankles at heart level.  The patient is instructed to begin routine exercise, especially walking on a daily basis   3. Chronic venous insufficiency No surgery or intervention at this point in time.    I have had a long discussion with the patient regarding venous insufficiency and why it  causes symptoms. I have discussed with the patient the chronic skin changes that accompany venous insufficiency and the long term sequela such as infection and ulceration.  Patient will begin wearing graduated compression stockings class 1 (20-30 mmHg) or compression wraps on a daily basis a prescription was given. The patient will put the stockings on first thing in the morning and removing them in the evening. The patient is instructed specifically not to sleep in the stockings.    In addition, behavioral modification including several periods of elevation of the lower extremities during the day will be continued. I have demonstrated that proper elevation is a position with the ankles at heart level.  The patient is instructed to begin routine exercise, especially walking on a daily basis   4. Paroxysmal atrial fibrillation (HCC) Continue antiarrhythmia medications as already ordered, these medications have been reviewed and there are no changes at this time.  Continue anticoagulation as ordered by Cardiology Service     Hortencia Pilar, MD  04/06/2018 2:55 PM

## 2018-04-08 ENCOUNTER — Ambulatory Visit (INDEPENDENT_AMBULATORY_CARE_PROVIDER_SITE_OTHER): Payer: Medicare Other

## 2018-04-08 DIAGNOSIS — Z952 Presence of prosthetic heart valve: Secondary | ICD-10-CM

## 2018-04-08 DIAGNOSIS — I480 Paroxysmal atrial fibrillation: Secondary | ICD-10-CM

## 2018-04-08 DIAGNOSIS — Z5181 Encounter for therapeutic drug level monitoring: Secondary | ICD-10-CM

## 2018-04-08 LAB — POCT INR: INR: 4.4 — AB (ref 2.0–3.0)

## 2018-04-08 NOTE — Patient Instructions (Signed)
Please skip coumadin tonight, then START NEW DOSAGE of 3mg  (1/2 tablet) daily, EXCEPT WHOLE PILL ON [place]. Recheck in 2 weeks.

## 2018-04-14 ENCOUNTER — Telehealth: Payer: Self-pay | Admitting: Cardiovascular Disease

## 2018-04-14 NOTE — Telephone Encounter (Signed)
Pt c/o medication issue:  1. Name of Medication: coumadin  2. How are you currently taking this medication (dosage and times per day)? Not sure lost avs  3. Are you having a reaction (difficulty breathing--STAT)? No   4. What is your medication issue? Lost AVS cannot remember med dose please call

## 2018-04-14 NOTE — Telephone Encounter (Signed)
Returned call to pt, pt states she misplaced her AVS from her last Coumadin check and cannot remember what dosage she is supposed to be taking.  Advised pt her current dosage of Coumadin should be 1/2 tablet daily except 1 tablet on Mondays and Fridays.  Pt verbalized understanding and thanked me for the call.

## 2018-04-22 ENCOUNTER — Ambulatory Visit (INDEPENDENT_AMBULATORY_CARE_PROVIDER_SITE_OTHER): Payer: Medicare Other

## 2018-04-22 DIAGNOSIS — Z952 Presence of prosthetic heart valve: Secondary | ICD-10-CM | POA: Diagnosis not present

## 2018-04-22 DIAGNOSIS — I480 Paroxysmal atrial fibrillation: Secondary | ICD-10-CM

## 2018-04-22 DIAGNOSIS — Z5181 Encounter for therapeutic drug level monitoring: Secondary | ICD-10-CM | POA: Diagnosis not present

## 2018-04-22 LAB — POCT INR: INR: 2.7 (ref 2.0–3.0)

## 2018-04-22 NOTE — Patient Instructions (Signed)
Please continue dosage of 3mg  (1/2 tablet) daily, EXCEPT WHOLE PILL ON [place]. Recheck in 3 weeks.

## 2018-05-13 ENCOUNTER — Ambulatory Visit (INDEPENDENT_AMBULATORY_CARE_PROVIDER_SITE_OTHER): Payer: Medicare Other

## 2018-05-13 DIAGNOSIS — Z952 Presence of prosthetic heart valve: Secondary | ICD-10-CM | POA: Diagnosis not present

## 2018-05-13 DIAGNOSIS — Z5181 Encounter for therapeutic drug level monitoring: Secondary | ICD-10-CM | POA: Diagnosis not present

## 2018-05-13 DIAGNOSIS — I480 Paroxysmal atrial fibrillation: Secondary | ICD-10-CM | POA: Diagnosis not present

## 2018-05-13 LAB — POCT INR: INR: 3.8 — AB (ref 2.0–3.0)

## 2018-05-13 NOTE — Patient Instructions (Signed)
Please skip coumadin tonight, then continue dosage of 3mg  (1/2 tablet) daily, EXCEPT WHOLE PILL ON [place]. Recheck in 2 weeks.

## 2018-05-25 ENCOUNTER — Ambulatory Visit (INDEPENDENT_AMBULATORY_CARE_PROVIDER_SITE_OTHER): Payer: Medicare Other

## 2018-05-25 DIAGNOSIS — Z952 Presence of prosthetic heart valve: Secondary | ICD-10-CM

## 2018-05-25 DIAGNOSIS — I480 Paroxysmal atrial fibrillation: Secondary | ICD-10-CM | POA: Diagnosis not present

## 2018-05-25 DIAGNOSIS — Z5181 Encounter for therapeutic drug level monitoring: Secondary | ICD-10-CM | POA: Diagnosis not present

## 2018-05-25 LAB — POCT INR: INR: 3.2 — AB (ref 2.0–3.0)

## 2018-05-25 NOTE — Patient Instructions (Signed)
Please continue dosage of 3mg  (1/2 tablet) daily, EXCEPT WHOLE PILL ON [place]. Recheck in 3 weeks.

## 2018-06-08 ENCOUNTER — Ambulatory Visit (INDEPENDENT_AMBULATORY_CARE_PROVIDER_SITE_OTHER): Payer: Medicare Other | Admitting: Vascular Surgery

## 2018-06-10 ENCOUNTER — Ambulatory Visit (INDEPENDENT_AMBULATORY_CARE_PROVIDER_SITE_OTHER): Payer: Medicare Other

## 2018-06-10 DIAGNOSIS — Z5181 Encounter for therapeutic drug level monitoring: Secondary | ICD-10-CM | POA: Diagnosis not present

## 2018-06-10 DIAGNOSIS — I480 Paroxysmal atrial fibrillation: Secondary | ICD-10-CM | POA: Diagnosis not present

## 2018-06-10 DIAGNOSIS — Z952 Presence of prosthetic heart valve: Secondary | ICD-10-CM

## 2018-06-10 LAB — POCT INR: INR: 2.6 (ref 2.0–3.0)

## 2018-06-10 NOTE — Patient Instructions (Signed)
Please continue dosage of 3mg  (1/2 tablet) daily, EXCEPT WHOLE PILL ON [place]. Recheck in 4 weeks.

## 2018-06-22 ENCOUNTER — Other Ambulatory Visit: Payer: Self-pay | Admitting: Cardiovascular Disease

## 2018-06-22 ENCOUNTER — Telehealth: Payer: Self-pay | Admitting: Cardiovascular Disease

## 2018-06-22 NOTE — Telephone Encounter (Signed)
Patient daughter wants Rockey Situ to know she was seen at [facility] about loud ticking from [facility] and had an echo done.

## 2018-06-26 NOTE — Telephone Encounter (Signed)
Thank you for making [place] aware We can read notes from Dr. Mina Marble and see Echo Aware of recent medication changes

## 2018-06-26 NOTE — Telephone Encounter (Signed)
Spoke with patients daughter per release form and reviewed providers review and was appreciative for the call letting us know of the updates. She verbalized understanding with no further questions at this time.

## 2018-06-26 NOTE — Telephone Encounter (Signed)
Left voicemail message for patients daughter to call back

## 2018-07-08 ENCOUNTER — Ambulatory Visit (INDEPENDENT_AMBULATORY_CARE_PROVIDER_SITE_OTHER): Payer: Medicare Other

## 2018-07-08 DIAGNOSIS — Z952 Presence of prosthetic heart valve: Secondary | ICD-10-CM

## 2018-07-08 DIAGNOSIS — I480 Paroxysmal atrial fibrillation: Secondary | ICD-10-CM

## 2018-07-08 DIAGNOSIS — Z5181 Encounter for therapeutic drug level monitoring: Secondary | ICD-10-CM | POA: Diagnosis not present

## 2018-07-08 LAB — POCT INR: INR: 2.9 (ref 2.0–3.0)

## 2018-07-08 NOTE — Patient Instructions (Signed)
Please continue dosage of 3mg  (1/2 tablet) daily, EXCEPT WHOLE PILL ON [place]. Recheck in 5 weeks.

## 2018-07-16 ENCOUNTER — Ambulatory Visit (INDEPENDENT_AMBULATORY_CARE_PROVIDER_SITE_OTHER): Payer: Medicare Other | Admitting: Vascular Surgery

## 2018-07-21 ENCOUNTER — Encounter: Payer: Medicare Other | Attending: Internal Medicine

## 2018-07-21 ENCOUNTER — Other Ambulatory Visit: Payer: Self-pay

## 2018-07-21 VITALS — Ht 61.6 in | Wt 144.3 lb

## 2018-07-21 DIAGNOSIS — I5042 Chronic combined systolic (congestive) and diastolic (congestive) heart failure: Secondary | ICD-10-CM | POA: Insufficient documentation

## 2018-07-21 DIAGNOSIS — Z87891 Personal history of nicotine dependence: Secondary | ICD-10-CM | POA: Diagnosis not present

## 2018-07-21 DIAGNOSIS — N189 Chronic kidney disease, unspecified: Secondary | ICD-10-CM | POA: Insufficient documentation

## 2018-07-21 DIAGNOSIS — Z7901 Long term (current) use of anticoagulants: Secondary | ICD-10-CM | POA: Insufficient documentation

## 2018-07-21 DIAGNOSIS — K219 Gastro-esophageal reflux disease without esophagitis: Secondary | ICD-10-CM | POA: Insufficient documentation

## 2018-07-21 DIAGNOSIS — I482 Chronic atrial fibrillation, unspecified: Secondary | ICD-10-CM | POA: Diagnosis not present

## 2018-07-21 DIAGNOSIS — Z9981 Dependence on supplemental oxygen: Secondary | ICD-10-CM | POA: Diagnosis not present

## 2018-07-21 DIAGNOSIS — I130 Hypertensive heart and chronic kidney disease with heart failure and stage 1 through stage 4 chronic kidney disease, or unspecified chronic kidney disease: Secondary | ICD-10-CM | POA: Diagnosis not present

## 2018-07-21 DIAGNOSIS — J9611 Chronic respiratory failure with hypoxia: Secondary | ICD-10-CM | POA: Insufficient documentation

## 2018-07-21 NOTE — Progress Notes (Signed)
Daily Session Note  Patient Details  Name: [name] MRN: [id number] Date of Birth: 08-20-1934 Referring Provider:     Pulmonary Rehab from 07/21/2018 in [facility]  Referring Provider  [name] MD      Encounter Date: 07/21/2018  Check In: Session Check In - 07/21/18 1428      Check-In   Supervising physician immediately available to respond to emergencies  [facility] immediately available ER MD    Physician(s)  Dr. Quentin Cornwall and Mariea Clonts    Location  [facility]-Cardiac & Pulmonary Rehab    Staff Present  Justin Mend RCP,RRT,BSRT;Jessica Luan Pulling, MA, RCEP, CCRP, Exercise Physiologist    Medication changes reported      No    Fall or balance concerns reported     No    Warm-up and Cool-down  Not performed (comment)   medical evaluation   Resistance Training Performed  No    VAD Patient?  No      VAD patient   Has back up controller?  No      Pain Assessment   Currently in Pain?  No/denies        Exercise Prescription Changes - 07/21/18 1500      Response to Exercise   Blood Pressure (Admit)  126/62    Blood Pressure (Exercise)  164/74    Blood Pressure (Exit)  136/64    Heart Rate (Admit)  72 bpm    Heart Rate (Exercise)  83 bpm    Heart Rate (Exit)  73 bpm    Oxygen Saturation (Admit)  94 %    Oxygen Saturation (Exercise)  80 %    Oxygen Saturation (Exit)  98 %    Rating of Perceived Exertion (Exercise)  11    Perceived Dyspnea (Exercise)  3    Symptoms  chronic back pain and SOB    Comments  walk test results       Social History   Tobacco Use  Smoking Status Former Smoker  . Packs/day: 1.00  . Years: 27.00  . Pack years: 27.00  . Types: Cigarettes  . Last attempt to quit: 07/01/1980  . Years since quitting: 38.0  Smokeless Tobacco Never Used  Tobacco Comment   "quit smoking ~ 1980    Goals Met:  Exercise tolerated well Personal goals reviewed Queuing for purse lip breathing No report of cardiac concerns or  symptoms  Goals Unmet:  Not Applicable  Comments:  6 Minute Walk    Row Name 07/21/18 1517         6 Minute Walk   Phase  Initial     Distance  580 feet     Walk Time  6 minutes     # of Rest Breaks  0     MPH  1.09     METS  1.01     RPE  11     Perceived Dyspnea   3     VO2 Peak  3.54     Symptoms  Yes (comment)     Comments  chronic back pain     Resting HR  72 bpm     Resting BP  126/62     Resting Oxygen Saturation   94 %     Exercise Oxygen Saturation  during 6 min walk  80 %     Max Ex. HR  83 bpm     Max Ex. BP  164/74     2 Minute Post  BP  136/64       Interval HR   1 Minute HR  72     2 Minute HR  76     3 Minute HR  79     4 Minute HR  69     5 Minute HR  71     6 Minute HR  83     2 Minute Post HR  73     Interval Heart Rate?  Yes       Interval Oxygen   Interval Oxygen?  Yes     Baseline Oxygen Saturation %  94 %     1 Minute Oxygen Saturation %  88 %     1 Minute Liters of Oxygen  2 L continuous     2 Minute Oxygen Saturation %  83 %     2 Minute Liters of Oxygen  2 L     3 Minute Oxygen Saturation %  82 %     3 Minute Liters of Oxygen  2 L     4 Minute Oxygen Saturation %  82 %     4 Minute Liters of Oxygen  2 L     5 Minute Oxygen Saturation %  80 %     5 Minute Liters of Oxygen  2 L     6 Minute Oxygen Saturation %  87 %     6 Minute Liters of Oxygen  2 L     2 Minute Post Oxygen Saturation %  98 %     2 Minute Post Liters of Oxygen  2 L       Service TIme [id number]   Dr. [name] is Medical Director for [facility] and [facility].

## 2018-07-21 NOTE — Patient Instructions (Signed)
Patient Instructions Patient Details  Name: [name] MRN: [id number] Date of Birth: 1935-07-01 Referring Provider:  Rusty Aus, MD Below are your personal goals for exercise, nutrition, and risk factors. Our goal is to help you stay on track towards obtaining and maintaining these goals. We will be discussing your progress on these goals with you throughout the program. Initial Exercise Prescription: Initial Exercise Prescription - 07/21/18 1500      Date of Initial Exercise RX and Referring Provider   Date  07/21/18    Referring Provider  [name] MD      Oxygen   Oxygen  Continuous    Liters  2      T5 Nustep   Level  1    SPM  80    Minutes  15    METs  1.5      Biostep-RELP   Level  1    SPM  50    Minutes  15    METs  1      Track   Laps  11    Minutes  15    METs  1.5      Prescription Details   Frequency (times per week)  3    Duration  Progress to 45 minutes of aerobic exercise without signs/symptoms of physical distress      Intensity   THRR 40-80% of Max Heartrate  99-126    Ratings of Perceived Exertion  11-13    Perceived Dyspnea  0-4      Progression   Progression  Continue to progress workloads to maintain intensity without signs/symptoms of physical distress.      Resistance Training   Training Prescription  Yes    Weight  3 lbs    Reps  10-15      Exercise Goals: Frequency: Be able to perform aerobic exercise two to three times per week in program working toward 2-5 days per week of home exercise. Intensity: Work with a perceived exertion of 11 (fairly light) - 15 (hard) while following your exercise prescription.  We will make changes to your prescription with you as you progress through the program. Duration: Be able to do 30 to 45 minutes of continuous aerobic exercise in addition to a 5 minute warm-up and a 5 minute cool-down routine. Nutrition Goals: Your personal nutrition goals will be established when you do your  nutrition analysis with the dietician. The following are general nutrition guidelines to follow: Cholesterol < 200mg /day Sodium < 1500mg /day Fiber: Women over 50 yrs - 21 grams per day Personal Goals: Personal Goals and Risk Factors at Admission - 07/21/18 1516      Core Components/Risk Factors/Patient Goals on Admission    Weight Management  Yes;Weight Maintenance    Intervention  Weight Management: Develop a combined nutrition and exercise program designed to reach desired caloric intake, while maintaining appropriate intake of nutrient and fiber, sodium and fats, and appropriate energy expenditure required for the weight goal.;Weight Management: Provide education and appropriate resources to help participant work on and attain dietary goals.    Admit Weight  144 lb (65.3 kg)    Goal Weight: Short Term  140 lb (63.5 kg)    Goal Weight: Long Term  140 lb (63.5 kg)    Expected Outcomes  Short Term: Continue to assess and modify interventions until short term weight is achieved;Long Term: Adherence to nutrition and physical activity/exercise program aimed toward attainment of established weight goal;Weight Maintenance: Understanding of  the daily nutrition guidelines, which includes 25-35% calories from fat, 7% or less cal from saturated fats, less than 200mg  cholesterol, less than 1.5gm of sodium, & 5 or more servings of fruits and vegetables daily;Understanding recommendations for meals to include 15-35% energy as protein, 25-35% energy from fat, 35-60% energy from carbohydrates, less than 200mg  of dietary cholesterol, 20-35 gm of total fiber daily;Understanding of distribution of calorie intake throughout the day with the consumption of 4-5 meals/snacks    Improve shortness of breath with ADL's  Yes    Intervention  Provide education, individualized exercise plan and daily activity instruction to help decrease symptoms of SOB with activities of daily living.    Expected Outcomes  Short Term:  Improve cardiorespiratory fitness to achieve a reduction of symptoms when performing ADLs;Long Term: Be able to perform more ADLs without symptoms or delay the onset of symptoms    Heart Failure  Yes    Intervention  Provide a combined exercise and nutrition program that is supplemented with education, support and counseling about heart failure. Directed toward relieving symptoms such as shortness of breath, decreased exercise tolerance, and extremity edema.    Expected Outcomes  Improve functional capacity of life;Short term: Attendance in program 2-3 days a week with increased exercise capacity. Reported lower sodium intake. Reported increased fruit and vegetable intake. Reports medication compliance.;Short term: Daily weights obtained and reported for increase. Utilizing diuretic protocols set by physician.;Long term: Adoption of self-care skills and reduction of barriers for early signs and symptoms recognition and intervention leading to self-care maintenance.      Tobacco Use Initial Evaluation: Social History   Tobacco Use  Smoking Status Former Smoker  . Packs/day: 1.00  . Years: 27.00  . Pack years: 27.00  . Types: Cigarettes  . Last attempt to quit: 07/01/1980  . Years since quitting: 38.0  Smokeless Tobacco Never Used  Tobacco Comment   "quit smoking ~ 1980   Exercise Goals and Review: Exercise Goals    Row Name 07/21/18 1524             Exercise Goals   Increase Physical Activity  Yes       Intervention  Provide advice, education, support and counseling about physical activity/exercise needs.;Develop an individualized exercise prescription for aerobic and resistive training based on initial evaluation findings, risk stratification, comorbidities and participant's personal goals.       Expected Outcomes  Short Term: Attend rehab on a regular basis to increase amount of physical activity.;Long Term: Add in home exercise to make exercise part of routine and to increase amount  of physical activity.;Long Term: Exercising regularly at least 3-5 days a week.       Increase Strength and Stamina  Yes       Intervention  Develop an individualized exercise prescription for aerobic and resistive training based on initial evaluation findings, risk stratification, comorbidities and participant's personal goals.;Provide advice, education, support and counseling about physical activity/exercise needs.       Expected Outcomes  Short Term: Increase workloads from initial exercise prescription for resistance, speed, and METs.;Long Term: Improve cardiorespiratory fitness, muscular endurance and strength as measured by increased METs and functional capacity (6MWT);Short Term: Perform resistance training exercises routinely during rehab and add in resistance training at home       Able to understand and use rate of perceived exertion (RPE) scale  Yes       Intervention  Provide education and explanation on how to use RPE scale  Expected Outcomes  Short Term: Able to use RPE daily in rehab to express subjective intensity level;Long Term:  Able to use RPE to guide intensity level when exercising independently       Able to understand and use Dyspnea scale  Yes       Intervention  Provide education and explanation on how to use Dyspnea scale       Expected Outcomes  Short Term: Able to use Dyspnea scale daily in rehab to express subjective sense of shortness of breath during exertion;Long Term: Able to use Dyspnea scale to guide intensity level when exercising independently       Knowledge and understanding of Target Heart Rate Range (THRR)  Yes       Intervention  Provide education and explanation of THRR including how the numbers were predicted and where they are located for reference       Expected Outcomes  Short Term: Able to state/look up THRR;Short Term: Able to use daily as guideline for intensity in rehab;Long Term: Able to use THRR to govern intensity when exercising independently        Able to check pulse independently  Yes       Intervention  Provide education and demonstration on how to check pulse in carotid and radial arteries.;Review the importance of being able to check your own pulse for safety during independent exercise       Expected Outcomes  Short Term: Able to explain why pulse checking is important during independent exercise;Long Term: Able to check pulse independently and accurately       Understanding of Exercise Prescription  Yes       Intervention  Provide education, explanation, and written materials on patient's individual exercise prescription       Expected Outcomes  Short Term: Able to explain program exercise prescription;Long Term: Able to explain home exercise prescription to exercise independently         Copy of goals given to participant.

## 2018-07-21 NOTE — Progress Notes (Signed)
Pulmonary Individual Treatment Plan  Patient Details  Name: [name] MRN: [id number] Date of Birth: [name] 22, 1935 Referring Provider:     Pulmonary Rehab from 07/21/2018 in [facility] Cardiac and Pulmonary Rehab  Referring Provider  [name] MD      Initial Encounter Date:    Pulmonary Rehab from 07/21/2018 in [facility] Cardiac and Pulmonary Rehab  Date  07/21/18      Visit Diagnosis: Chronic respiratory failure with hypoxia, on home O2 therapy ([place])  Patient's Home Medications on Admission:  Current Outpatient Medications:  .  acetaminophen (TYLENOL) 650 MG CR tablet, Take 650 mg by mouth every 8 (eight) hours as needed for pain., Disp: , Rfl:  .  amiodarone (PACERONE) 200 MG tablet, TAKE 1 TABLET BY MOUTH DAILY, Disp: 90 tablet, Rfl: 3 .  calcium carbonate (TUMS EX) 750 MG chewable tablet, Chew by mouth., Disp: , Rfl:  .  Cholecalciferol (VITAMIN D) 2000 units tablet, Take 2,000 Units by mouth daily., Disp: , Rfl:  .  doxycycline (VIBRA-TABS) 100 MG tablet, , Disp: , Rfl:  .  furosemide (LASIX) 20 MG tablet, Take 1 tablet (20 mg total) by mouth 3 (three) times daily., Disp: 270 tablet, Rfl: 3 .  KLOR-CON 10 10 MEQ tablet, Take 10 mEq by mouth daily. , Disp: , Rfl:  .  Lidocaine 4 % PTCH, Apply topically., Disp: , Rfl:  .  losartan (COZAAR) 25 MG tablet, TAKE 1/2 TABLET EVERY DAY, Disp: 30 tablet, Rfl: 3 .  Menthol (ICY HOT) 5 % PTCH, Apply 1 patch topically daily as needed (pain)., Disp: , Rfl:  .  metolazone (ZAROXOLYN) 5 MG tablet, Take 1 tablet (5 mg total) by mouth as needed (As needed for weight greater than 148)., Disp: 90 tablet, Rfl: 3 .  Multiple Vitamin (MULTIVITAMIN WITH MINERALS) TABS tablet, Take 1 tablet by mouth daily., Disp: , Rfl:  .  Probiotic Product (RISA-BID PROBIOTIC PO), Take 1 capsule by mouth daily. , Disp: , Rfl:  .  ranitidine (ZANTAC) 150 MG tablet, Take 300 mg by mouth at bedtime., Disp: , Rfl:  .  Simethicone (GAS-X PO), Take by mouth., Disp: , Rfl:   .  spironolactone (ALDACTONE) 25 MG tablet, Take 25 mg by mouth daily. , Disp: , Rfl:  .  vitamin C (ASCORBIC ACID) 500 MG tablet, Take 500 mg by mouth daily., Disp: , Rfl:  .  warfarin (COUMADIN) 6 MG tablet, Take 3-6 mg by mouth See admin instructions. Take 3 mg once daily at night except for Wednesdays take 6 mg once nightly, Disp: , Rfl:   Past Medical History: Past Medical History:  Diagnosis Date  . Arthritis    "back, hands, knees" (04/10/2016)  . Chronic atrial fibrillation    a. On Coumadin  . Chronic back pain   . 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 EF 25% by echo in 08/2015 b. RHC in 08/2015 showed normal filling pressures  . Chronic kidney disease   . Compression fracture    "several; all in my back" (04/10/2016)  . GERD (gastroesophageal reflux disease)   . Hypertension   . Lymphedema   . S/P MVR (mitral valve replacement)    a. MVR 1994 b. redo MVR in 08/2014 - on Coumadin  . Shortness of breath dyspnea     Tobacco Use: Social History   Tobacco Use  Smoking Status Former Smoker  . Packs/day: 1.00  . Years: 27.00  . Pack years:  27.00  . Types: Cigarettes  . Last attempt to quit: 07/01/1980  . Years since quitting: 38.0  Smokeless Tobacco Never Used  Tobacco Comment   "quit smoking ~ 1980    Labs: Recent Review Flowsheet Data    There is no flowsheet data to display.       Pulmonary Assessment Scores: Pulmonary Assessment Scores    Row Name 07/21/18 1451         ADL UCSD   ADL Phase  Entry     SOB Score total  100     Rest  3     Walk  3     Stairs  4     Bath  5     Dress  5     Shop  5       CAT Score   CAT Score  24       mMRC Score   mMRC Score  4        Pulmonary Function Assessment: Pulmonary Function Assessment - 07/21/18 1511      Breath   Bilateral Breath Sounds  Clear;Decreased    Shortness of Breath  Yes;Limiting activity       Exercise Target  Goals: Exercise Program Goal: Individual exercise prescription set using results from initial 6 min walk test and THRR while considering  patient's activity barriers and safety.   Exercise Prescription Goal: Initial exercise prescription builds to 30-45 minutes a day of aerobic activity, 2-3 days per week.  Home exercise guidelines will be given to patient during program as part of exercise prescription that the participant will acknowledge.  Activity Barriers & Risk Stratification: Activity Barriers & Cardiac Risk Stratification - 07/21/18 1520      Activity Barriers & Cardiac Risk Stratification   Activity Barriers  Arthritis;Back Problems;Deconditioning;Muscular Weakness;Shortness of Breath;Balance Concerns;Assistive Device   hx of multiple back fractures      6 Minute Walk: 6 Minute Walk    Row Name 07/21/18 1517         6 Minute Walk   Phase  Initial     Distance  580 feet     Walk Time  6 minutes     # of Rest Breaks  0     MPH  1.09     METS  1.01     RPE  11     Perceived Dyspnea   3     VO2 Peak  3.54     Symptoms  Yes (comment)     Comments  chronic back pain     Resting HR  72 bpm     Resting BP  126/62     Resting Oxygen Saturation   94 %     Exercise Oxygen Saturation  during 6 min walk  80 %     Max Ex. HR  83 bpm     Max Ex. BP  164/74     2 Minute Post BP  136/64       Interval HR   1 Minute HR  72     2 Minute HR  76     3 Minute HR  79     4 Minute HR  69     5 Minute HR  71     6 Minute HR  83     2 Minute Post HR  73     Interval Heart Rate?  Yes       Interval Oxygen  Interval Oxygen?  Yes     Baseline Oxygen Saturation %  94 %     1 Minute Oxygen Saturation %  88 %     1 Minute Liters of Oxygen  2 L continuous     2 Minute Oxygen Saturation %  83 %     2 Minute Liters of Oxygen  2 L     3 Minute Oxygen Saturation %  82 %     3 Minute Liters of Oxygen  2 L     4 Minute Oxygen Saturation %  82 %     4 Minute Liters of Oxygen  2 L      5 Minute Oxygen Saturation %  80 %     5 Minute Liters of Oxygen  2 L     6 Minute Oxygen Saturation %  87 %     6 Minute Liters of Oxygen  2 L     2 Minute Post Oxygen Saturation %  98 %     2 Minute Post Liters of Oxygen  2 L       Oxygen Initial Assessment: Oxygen Initial Assessment - 07/21/18 1510      Home Oxygen   Home Oxygen Device  Home Concentrator;E-Tanks    Sleep Oxygen Prescription  Continuous    Liters per minute  2    Home Exercise Oxygen Prescription  Continuous    Liters per minute  2    Home at Rest Exercise Oxygen Prescription  Continuous    Liters per minute  2    Compliance with Home Oxygen Use  Yes      Initial 6 min Walk   Oxygen Used  Continuous;E-Tanks    Liters per minute  2      Program Oxygen Prescription   Program Oxygen Prescription  Continuous    Liters per minute  3      Intervention   Short Term Goals  To learn and exhibit compliance with exercise, home and travel O2 prescription;To learn and understand importance of monitoring SPO2 with pulse oximeter and demonstrate accurate use of the pulse oximeter.;To learn and understand importance of maintaining oxygen saturations>88%;To learn and demonstrate proper pursed lip breathing techniques or other breathing techniques.    Long  Term Goals  Verbalizes importance of monitoring SPO2 with pulse oximeter and return demonstration;Exhibits compliance with exercise, home and travel O2 prescription;Maintenance of O2 saturations>88%;Exhibits proper breathing techniques, such as pursed lip breathing or other method taught during program session;Compliance with respiratory medication       Oxygen Re-Evaluation:   Oxygen Discharge (Final Oxygen Re-Evaluation):   Initial Exercise Prescription: Initial Exercise Prescription - 07/21/18 1500      Date of Initial Exercise RX and Referring Provider   Date  07/21/18    Referring Provider  [name] MD      Oxygen   Oxygen  Continuous    Liters  2       T5 Nustep   Level  1    SPM  80    Minutes  15    METs  1.5      Biostep-RELP   Level  1    SPM  50    Minutes  15    METs  1      Track   Laps  11    Minutes  15    METs  1.5      Prescription Details   Frequency (times per  week)  3    Duration  Progress to 45 minutes of aerobic exercise without signs/symptoms of physical distress      Intensity   THRR 40-80% of Max Heartrate  99-126    Ratings of Perceived Exertion  11-13    Perceived Dyspnea  0-4      Progression   Progression  Continue to progress workloads to maintain intensity without signs/symptoms of physical distress.      Resistance Training   Training Prescription  Yes    Weight  3 lbs    Reps  10-15       Perform Capillary Blood Glucose checks as needed.  Exercise Prescription Changes: Exercise Prescription Changes    Row Name 07/21/18 1500             Response to Exercise   Blood Pressure (Admit)  126/62       Blood Pressure (Exercise)  164/74       Blood Pressure (Exit)  136/64       Heart Rate (Admit)  72 bpm       Heart Rate (Exercise)  83 bpm       Heart Rate (Exit)  73 bpm       Oxygen Saturation (Admit)  94 %       Oxygen Saturation (Exercise)  80 %       Oxygen Saturation (Exit)  98 %       Rating of Perceived Exertion (Exercise)  11       Perceived Dyspnea (Exercise)  3       Symptoms  chronic back pain and SOB       Comments  walk test results          Exercise Comments:   Exercise Goals and Review: Exercise Goals    Row Name 07/21/18 1524             Exercise Goals   Increase Physical Activity  Yes       Intervention  Provide advice, education, support and counseling about physical activity/exercise needs.;Develop an individualized exercise prescription for aerobic and resistive training based on initial evaluation findings, risk stratification, comorbidities and participant's personal goals.       Expected Outcomes  Short Term: Attend rehab on a regular basis to  increase amount of physical activity.;Long Term: Add in home exercise to make exercise part of routine and to increase amount of physical activity.;Long Term: Exercising regularly at least 3-5 days a week.       Increase Strength and Stamina  Yes       Intervention  Develop an individualized exercise prescription for aerobic and resistive training based on initial evaluation findings, risk stratification, comorbidities and participant's personal goals.;Provide advice, education, support and counseling about physical activity/exercise needs.       Expected Outcomes  Short Term: Increase workloads from initial exercise prescription for resistance, speed, and METs.;Long Term: Improve cardiorespiratory fitness, muscular endurance and strength as measured by increased METs and functional capacity (6MWT);Short Term: Perform resistance training exercises routinely during rehab and add in resistance training at home       Able to understand and use rate of perceived exertion (RPE) scale  Yes       Intervention  Provide education and explanation on how to use RPE scale       Expected Outcomes  Short Term: Able to use RPE daily in rehab to express subjective intensity level;Long Term:  Able to use RPE to guide  intensity level when exercising independently       Able to understand and use Dyspnea scale  Yes       Intervention  Provide education and explanation on how to use Dyspnea scale       Expected Outcomes  Short Term: Able to use Dyspnea scale daily in rehab to express subjective sense of shortness of breath during exertion;Long Term: Able to use Dyspnea scale to guide intensity level when exercising independently       Knowledge and understanding of Target Heart Rate Range (THRR)  Yes       Intervention  Provide education and explanation of THRR including how the numbers were predicted and where they are located for reference       Expected Outcomes  Short Term: Able to state/look up THRR;Short Term: Able to  use daily as guideline for intensity in rehab;Long Term: Able to use THRR to govern intensity when exercising independently       Able to check pulse independently  Yes       Intervention  Provide education and demonstration on how to check pulse in carotid and radial arteries.;Review the importance of being able to check your own pulse for safety during independent exercise       Expected Outcomes  Short Term: Able to explain why pulse checking is important during independent exercise;Long Term: Able to check pulse independently and accurately       Understanding of Exercise Prescription  Yes       Intervention  Provide education, explanation, and written materials on patient's individual exercise prescription       Expected Outcomes  Short Term: Able to explain program exercise prescription;Long Term: Able to explain home exercise prescription to exercise independently          Exercise Goals Re-Evaluation :   Discharge Exercise Prescription (Final Exercise Prescription Changes): Exercise Prescription Changes - 07/21/18 1500      Response to Exercise   Blood Pressure (Admit)  126/62    Blood Pressure (Exercise)  164/74    Blood Pressure (Exit)  136/64    Heart Rate (Admit)  72 bpm    Heart Rate (Exercise)  83 bpm    Heart Rate (Exit)  73 bpm    Oxygen Saturation (Admit)  94 %    Oxygen Saturation (Exercise)  80 %    Oxygen Saturation (Exit)  98 %    Rating of Perceived Exertion (Exercise)  11    Perceived Dyspnea (Exercise)  3    Symptoms  chronic back pain and SOB    Comments  walk test results       Nutrition:  Target Goals: Understanding of nutrition guidelines, daily intake of sodium <152m, cholesterol <2054m calories 30% from fat and 7% or less from saturated fats, daily to have 5 or more servings of fruits and vegetables.  Biometrics: Pre Biometrics - 07/21/18 1525      Pre Biometrics   Height  5' 1.6" (1.565 m)    Weight  144 lb 4.8 oz (65.5 kg)    Waist  Circumference  36 inches    Hip Circumference  38 inches    Waist to Hip Ratio  0.95 %    BMI (Calculated)  26.72    Single Leg Stand  1.88 seconds        Nutrition Therapy Plan and Nutrition Goals: Nutrition Therapy & Goals - 07/21/18 1513      Personal Nutrition Goals   Nutrition  Goal  She does not feel like meeting with the dietician.    Comments  She wants to be healthier      Intervention Plan   Intervention  Prescribe, educate and counsel regarding individualized specific dietary modifications aiming towards targeted core components such as weight, hypertension, lipid management, diabetes, heart failure and other comorbidities.;Nutrition handout(s) given to patient.    Expected Outcomes  Short Term Goal: Understand basic principles of dietary content, such as calories, fat, sodium, cholesterol and nutrients.;Long Term Goal: Adherence to prescribed nutrition plan.       Nutrition Assessments:   Nutrition Goals Re-Evaluation:   Nutrition Goals Discharge (Final Nutrition Goals Re-Evaluation):   Psychosocial: Target Goals: Acknowledge presence or absence of significant depression and/or stress, maximize coping skills, provide positive support system. Participant is able to verbalize types and ability to use techniques and skills needed for reducing stress and depression.   Initial Review & Psychosocial Screening: Initial Psych Review & Screening - 07/21/18 1511      Initial Review   Current issues with  Current Stress Concerns    Source of Stress Concerns  Chronic Illness;Family    Comments  She states her son stresses her out. She states that he tries to be a doctor for her.      Family Dynamics   Good Support System?  Yes    Comments  She can look to her daugter for support who lives with her.      Barriers   Psychosocial barriers to participate in program  The patient should benefit from training in stress management and relaxation.      Screening Interventions    Interventions  Encouraged to exercise;To provide support and resources with identified psychosocial needs;Program counselor consult;Provide feedback about the scores to participant    Expected Outcomes  Short Term goal: Utilizing psychosocial counselor, staff and physician to assist with identification of specific Stressors or current issues interfering with healing process. Setting desired goal for each stressor or current issue identified.;Long Term Goal: Stressors or current issues are controlled or eliminated.;Short Term goal: Identification and review with participant of any Quality of Life or Depression concerns found by scoring the questionnaire.;Long Term goal: The participant improves quality of Life and PHQ9 Scores as seen by post scores and/or verbalization of changes       Quality of Life Scores:  Scores of 19 and below usually indicate a poorer quality of life in these areas.  A difference of  2-3 points is a clinically meaningful difference.  A difference of 2-3 points in the total score of the Quality of Life Index has been associated with significant improvement in overall quality of life, self-image, physical symptoms, and general health in studies assessing change in quality of life.  PHQ-9: Recent Review Flowsheet Data    Depression screen [facility] 2/9 07/21/2018 10/27/2017 09/25/2015 01/05/2015   Decreased Interest 1 0 0 0   Down, Depressed, Hopeless 2 0 0 0   PHQ - 2 Score 3 0 0 0   Altered sleeping 3 - 3 1   Tired, decreased energy 2 - 3 1   Change in appetite 3 - 0 0   Feeling bad or failure about yourself  2 - 0 0   Trouble concentrating 2 - 0 0   Moving slowly or fidgety/restless 1 - 0 0   Suicidal thoughts 0 - 0 0   PHQ-9 Score 16 - 6 2   Difficult doing work/chores Very difficult - Somewhat  difficult -     Interpretation of Total Score  Total Score Depression Severity:  1-4 = Minimal depression, 5-9 = Mild depression, 10-14 = Moderate depression, 15-19 = Moderately  severe depression, 20-27 = Severe depression   Psychosocial Evaluation and Intervention:   Psychosocial Re-Evaluation:   Psychosocial Discharge (Final Psychosocial Re-Evaluation):   Education: Education Goals: Education classes will be provided on a weekly basis, covering required topics. Participant will state understanding/return demonstration of topics presented.  Learning Barriers/Preferences: Learning Barriers/Preferences - 07/21/18 1516      Learning Barriers/Preferences   Learning Barriers  Sight   wears glasses   Learning Preferences  None       Education Topics:  Initial Evaluation Education: - Verbal, written and demonstration of respiratory meds, oximetry and breathing techniques. Instruction on use of nebulizers and MDIs and importance of monitoring MDI activations.   Pulmonary Rehab from 07/21/2018 in [facility] Cardiac and Pulmonary Rehab  Date  07/21/18  Educator  [facility]  Instruction Review Code  1- Verbalizes Understanding      General Nutrition Guidelines/Fats and Fiber: -Group instruction provided by verbal, written material, models and posters to present the general guidelines for heart healthy nutrition. Gives an explanation and review of dietary fats and fiber.   Cardiac Rehab from 02/14/2016 in [facility] Cardiac and Pulmonary Rehab  Date  01/22/16  Educator  PI  Instruction Review Code (retired)  2- meets goals/outcomes      Controlling Sodium/Reading Food Labels: -Group verbal and written material supporting the discussion of sodium use in heart healthy nutrition. Review and explanation with models, verbal and written materials for utilization of the food label.   Cardiac Rehab from 02/14/2016 in [facility] Cardiac and Pulmonary Rehab  Date  01/29/16  Educator  PI  Instruction Review Code (retired)  2- meets goals/outcomes      Exercise Physiology & General Exercise Guidelines: - Group verbal and written instruction with models to review the exercise physiology of  the cardiovascular system and associated critical values. Provides general exercise guidelines with specific guidelines to those with heart or lung disease.    Cardiac Rehab from 02/14/2016 in [facility] Cardiac and Pulmonary Rehab  Date  02/05/16  Educator  [facility]  Instruction Review Code (retired)  2- meets goals/outcomes      Aerobic Exercise & Resistance Training: - Gives group verbal and written instruction on the various components of exercise. Focuses on aerobic and resistive training programs and the benefits of this training and how to safely progress through these programs.   Cardiac Rehab from 02/14/2016 in [facility] Cardiac and Pulmonary Rehab  Date  02/07/16  Educator  Nada Maclachlan  Instruction Review Code (retired)  2- Statistician, Balance, Mind/Body Relaxation: Provides group verbal/written instruction on the benefits of flexibility and balance training, including mind/body exercise modes such as yoga, pilates and tai chi.  Demonstration and skill practice provided.   Cardiac Rehab from 02/14/2016 in [facility] Cardiac and Pulmonary Rehab  Date  02/12/16  Educator  AS  Instruction Review Code (retired)  2- meets goals/outcomes      Stress and Anxiety: - Provides group verbal and written instruction about the health risks of elevated stress and causes of high stress.  Discuss the correlation between heart/lung disease and anxiety and treatment options. Review healthy ways to manage with stress and anxiety.   Cardiac Rehab from 02/14/2016 in [facility] Cardiac and Pulmonary Rehab  Date  02/14/16  Educator  [facility]  Instruction  Review Code (retired)  2- meets goals/outcomes      Depression: - Provides group verbal and written instruction on the correlation between heart/lung disease and depressed mood, treatment options, and the stigmas associated with seeking treatment.   Cardiac Rehab from 02/14/2016 in [facility] Cardiac and Pulmonary Rehab  Date  11/09/14  Educator  [facility]   Instruction Review Code (retired)  2- meets goals/outcomes      Exercise & Equipment Safety: - Individual verbal instruction and demonstration of equipment use and safety with use of the equipment.   Pulmonary Rehab from 07/21/2018 in [facility] Cardiac and Pulmonary Rehab  Date  07/21/18  Educator  [facility]  Instruction Review Code  1- Verbalizes Understanding      Infection Prevention: - Provides verbal and written material to individual with discussion of infection control including proper hand washing and proper equipment cleaning during exercise session.   Pulmonary Rehab from 07/21/2018 in [facility] Cardiac and Pulmonary Rehab  Date  07/21/18  Educator  [facility]  Instruction Review Code  1- Verbalizes Understanding      Falls Prevention: - Provides verbal and written material to individual with discussion of falls prevention and safety.   Pulmonary Rehab from 07/21/2018 in [facility] Cardiac and Pulmonary Rehab  Date  07/21/18  Educator  [facility]  Instruction Review Code  1- Verbalizes Understanding      Diabetes: - Individual verbal and written instruction to review signs/symptoms of diabetes, desired ranges of glucose level fasting, after meals and with exercise. Advice that pre and post exercise glucose checks will be done for 3 sessions at entry of program.   Chronic Lung Diseases: - Group verbal and written instruction to review updates, respiratory medications, advancements in procedures and treatments. Discuss use of supplemental oxygen including available portable oxygen systems, continuous and intermittent flow rates, concentrators, personal use and safety guidelines. Review proper use of inhaler and spacers. Provide informative websites for self-education.    Energy Conservation: - Provide group verbal and written instruction for methods to conserve energy, plan and organize activities. Instruct on pacing techniques, use of adaptive equipment and posture/positioning to relieve shortness of  breath.   Triggers and Exacerbations: - Group verbal and written instruction to review types of environmental triggers and ways to prevent exacerbations. Discuss weather changes, air quality and the benefits of nasal washing. Review warning signs and symptoms to help prevent infections. Discuss techniques for effective airway clearance, coughing, and vibrations.   AED/CPR: - Group verbal and written instruction with the use of models to demonstrate the basic use of the AED with the basic ABC's of resuscitation.   Anatomy and Physiology of the Lungs: - Group verbal and written instruction with the use of models to provide basic lung anatomy and physiology related to function, structure and complications of lung disease.   Anatomy & Physiology of the Heart: - Group verbal and written instruction and models provide basic cardiac anatomy and physiology, with the coronary electrical and arterial systems. Review of Valvular disease and Heart Failure   Cardiac Rehab from 02/14/2016 in [facility] Cardiac and Pulmonary Rehab  Date  12/25/15  Educator  DW  Instruction Review Code (retired)  2- meets goals/outcomes      Cardiac Medications: - Group verbal and written instruction to review commonly prescribed medications for heart disease. Reviews the medication, class of the drug, and side effects.   Cardiac Rehab from 02/14/2016 in [facility] Cardiac and Pulmonary Rehab  Date  01/15/16 Marisue Humble 2]  Educator  DW  Instruction Review Code (retired)  2- meets goals/outcomes      Know Your Numbers and Risk Factors: -Group verbal and written instruction about important numbers in your health.  Discussion of what are risk factors and how they play a role in the disease process.  Review of Cholesterol, Blood Pressure, Diabetes, and BMI and the role they play in your overall health.   Sleep Hygiene: -Provides group verbal and written instruction about how sleep can affect your health.  Define sleep hygiene,  discuss sleep cycles and impact of sleep habits. Review good sleep hygiene tips.    Other: -Provides group and verbal instruction on various topics (see comments)    Knowledge Questionnaire Score: Knowledge Questionnaire Score - 07/21/18 1450      Knowledge Questionnaire Score   Pre Score  12/18   reviewed with patient       Core Components/Risk Factors/Patient Goals at Admission: Personal Goals and Risk Factors at Admission - 07/21/18 1516      Core Components/Risk Factors/Patient Goals on Admission    Weight Management  Yes;Weight Maintenance    Intervention  Weight Management: Develop a combined nutrition and exercise program designed to reach desired caloric intake, while maintaining appropriate intake of nutrient and fiber, sodium and fats, and appropriate energy expenditure required for the weight goal.;Weight Management: Provide education and appropriate resources to help participant work on and attain dietary goals.    Admit Weight  144 lb (65.3 kg)    Goal Weight: Short Term  140 lb (63.5 kg)    Goal Weight: Long Term  140 lb (63.5 kg)    Expected Outcomes  Short Term: Continue to assess and modify interventions until short term weight is achieved;Long Term: Adherence to nutrition and physical activity/exercise program aimed toward attainment of established weight goal;Weight Maintenance: Understanding of the daily nutrition guidelines, which includes 25-35% calories from fat, 7% or less cal from saturated fats, less than 236m cholesterol, less than 1.5gm of sodium, & 5 or more servings of fruits and vegetables daily;Understanding recommendations for meals to include 15-35% energy as protein, 25-35% energy from fat, 35-60% energy from carbohydrates, less than 2063mof dietary cholesterol, 20-35 gm of total fiber daily;Understanding of distribution of calorie intake throughout the day with the consumption of 4-5 meals/snacks    Improve shortness of breath with ADL's  Yes     Intervention  Provide education, individualized exercise plan and daily activity instruction to help decrease symptoms of SOB with activities of daily living.    Expected Outcomes  Short Term: Improve cardiorespiratory fitness to achieve a reduction of symptoms when performing ADLs;Long Term: Be able to perform more ADLs without symptoms or delay the onset of symptoms    Heart Failure  Yes    Intervention  Provide a combined exercise and nutrition program that is supplemented with education, support and counseling about heart failure. Directed toward relieving symptoms such as shortness of breath, decreased exercise tolerance, and extremity edema.    Expected Outcomes  Improve functional capacity of life;Short term: Attendance in program 2-3 days a week with increased exercise capacity. Reported lower sodium intake. Reported increased fruit and vegetable intake. Reports medication compliance.;Short term: Daily weights obtained and reported for increase. Utilizing diuretic protocols set by physician.;Long term: Adoption of self-care skills and reduction of barriers for early signs and symptoms recognition and intervention leading to self-care maintenance.       Core Components/Risk Factors/Patient Goals Review:    Core Components/Risk Factors/Patient Goals at Discharge (  Final Review):    ITP Comments: ITP Comments    Row Name 07/21/18 1429           ITP Comments  Medical Evaluation completed. Chart sent for review and changes to Dr. [name] Director of [place]. Diagnosis can be found in [facility] encounter 07/09/2018          Comments: Initial ITP

## 2018-07-23 ENCOUNTER — Ambulatory Visit (INDEPENDENT_AMBULATORY_CARE_PROVIDER_SITE_OTHER): Payer: Medicare Other | Admitting: Vascular Surgery

## 2018-07-23 ENCOUNTER — Encounter (INDEPENDENT_AMBULATORY_CARE_PROVIDER_SITE_OTHER): Payer: Self-pay | Admitting: Vascular Surgery

## 2018-07-23 VITALS — BP 121/67 | HR 85 | Resp 16 | Ht 61.6 in | Wt 148.0 lb

## 2018-07-23 DIAGNOSIS — I5022 Chronic systolic (congestive) heart failure: Secondary | ICD-10-CM

## 2018-07-23 DIAGNOSIS — I480 Paroxysmal atrial fibrillation: Secondary | ICD-10-CM

## 2018-07-23 DIAGNOSIS — I872 Venous insufficiency (chronic) (peripheral): Secondary | ICD-10-CM

## 2018-07-23 DIAGNOSIS — E782 Mixed hyperlipidemia: Secondary | ICD-10-CM

## 2018-07-23 DIAGNOSIS — I890 Lymphedema, not elsewhere classified: Secondary | ICD-10-CM | POA: Diagnosis not present

## 2018-07-25 ENCOUNTER — Encounter (INDEPENDENT_AMBULATORY_CARE_PROVIDER_SITE_OTHER): Payer: Self-pay | Admitting: Vascular Surgery

## 2018-07-25 NOTE — Progress Notes (Signed)
MRN : [id number]  [name] is a 84 y.o. ([birth year]) female who presents with chief complaint of  Chief Complaint  Patient presents with  . Follow-up  .  History of Present Illness:  The patient returns to the office for followup evaluation regarding leg swelling.  The swelling has persisted but with the lymph pump is much, much better controlled. The pain associated with swelling is essentially eliminated. There have now been interval healing of her ulcerations and wounds.  The patient denies problems with the pump, noting it is working well and the leggings are in good condition.  Since the previous visit the patient has been wearing graduated compression stockings and using the lymph pump on a routine basis and  has noted significant improvement in the lymphedema.   Patient stated the lymph pump has been a very positive factor in her care.    Current Meds  Medication Sig  . acetaminophen (TYLENOL) 650 MG CR tablet Take 650 mg by mouth every 8 (eight) hours as needed for pain.  Marland Kitchen amiodarone (PACERONE) 200 MG tablet TAKE 1 TABLET BY MOUTH DAILY  . aspirin EC 81 MG tablet Take 81 mg by mouth daily.  . Cholecalciferol (VITAMIN D) 2000 units tablet Take 2,000 Units by mouth daily.  . furosemide (LASIX) 20 MG tablet Take 1 tablet (20 mg total) by mouth 3 (three) times daily.  Marland Kitchen KLOR-CON 10 10 MEQ tablet Take 10 mEq by mouth daily.   Marland Kitchen losartan (COZAAR) 25 MG tablet TAKE 1/2 TABLET EVERY DAY  . Menthol (ICY HOT) 5 % PTCH Apply 1 patch topically daily as needed (pain).  . Multiple Vitamin (MULTIVITAMIN WITH MINERALS) TABS tablet Take 1 tablet by mouth daily.  . Probiotic Product (RISA-BID PROBIOTIC PO) Take 1 capsule by mouth daily.   . Simethicone (GAS-X PO) Take by mouth.  . spironolactone (ALDACTONE) 25 MG tablet Take 25 mg by mouth daily.   . vitamin C (ASCORBIC ACID) 500 MG tablet Take 500 mg by mouth daily.  Marland Kitchen warfarin (COUMADIN) 6 MG tablet Take 3-6 mg by mouth See  admin instructions. Take 3 mg once daily at night except for Wednesdays take 6 mg once nightly    Past Medical History:  Diagnosis Date  . Arthritis    "back, hands, knees" (04/10/2016)  . Chronic atrial fibrillation    a. On Coumadin  . Chronic back pain   . 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 EF 25% by echo in 08/2015 b. RHC in 08/2015 showed normal filling pressures  . Chronic kidney disease   . Compression fracture    "several; all in my back" (04/10/2016)  . GERD (gastroesophageal reflux disease)   . Hypertension   . Lymphedema   . S/P MVR (mitral valve replacement)    a. MVR 1994 b. redo MVR in 08/2014 - on Coumadin  . Shortness of breath dyspnea     Past Surgical History:  Procedure Laterality Date  . BREAST LUMPECTOMY Right 2005?   benign lump excision  . CARDIAC CATHETERIZATION    . [place], 2016   "MVR; MVR"  . CATARACT EXTRACTION W/PHACO Right 02/20/2015   Procedure: CATARACT EXTRACTION PHACO AND INTRAOCULAR LENS PLACEMENT (IOC);  Surgeon: Estill Cotta, MD;  Location: [facility] ORS;  Service: Ophthalmology;  Laterality: Right;  US01:31.2 AP 25.3% CDE40.13 Fluid lot # [id number]  . ELECTROPHYSIOLOGIC STUDY N/A 10/09/2015   Procedure: CARDIOVERSION;  Surgeon:  Wellington Hampshire, MD;  Location: [facility] ORS;  Service: Cardiovascular;  Laterality: N/A;  . IR GENERIC HISTORICAL  04/01/2016   IR RADIOLOGIST EVAL & MGMT 04/01/2016 [facility]  . IR GENERIC HISTORICAL  04/15/2016   IR VERTEBROPLASTY CERV/THOR BX INC UNI/BIL INC/INJECT/IMAGING 04/15/2016 Luanne Bras, MD [facility]  . IR GENERIC HISTORICAL  04/15/2016   IR VERTEBROPLASTY EA ADDL (T&LS) BX INC UNI/BIL INC INJECT/IMAGING 04/15/2016 Luanne Bras, MD [facility]  . IR GENERIC HISTORICAL  04/15/2016   IR VERTEBROPLASTY EA ADDL (T&LS) BX INC UNI/BIL INC INJECT/IMAGING 04/15/2016 Luanne Bras, MD [facility]  . IR  GENERIC HISTORICAL  05/20/2016   IR RADIOLOGIST EVAL & MGMT 05/20/2016 [facility]  . IR GENERIC HISTORICAL  06/13/2016   IR VERTEBROPLASTY EA ADDL (T&LS) BX INC UNI/BIL INC INJECT/IMAGING 06/13/2016 Luanne Bras, MD [facility]  . IR GENERIC HISTORICAL  06/13/2016   IR SACROPLASTY BILATERAL 06/13/2016 Luanne Bras, MD [facility]  . IR GENERIC HISTORICAL  06/13/2016   IR VERTEBROPLASTY LUMBAR BX INC UNI/BIL INC/INJECT/IMAGING 06/13/2016 Luanne Bras, MD [facility]  . IRRIGATION AND DEBRIDEMENT HEMATOMA Left 07/05/2015   Procedure: IRRIGATION AND DEBRIDEMENT HEMATOMA;  Surgeon: Robert Bellow, MD;  Location: [facility] ORS;  Service: General;  Laterality: Left;  . pyloric stenosis  07/1937  . US ECHOCARDIOGRAPHY      Social History Social History   Tobacco Use  . Smoking status: Former Smoker    Packs/day: 1.00    Years: 27.00    Pack years: 27.00    Types: Cigarettes    Last attempt to quit: 07/01/1980    Years since quitting: 38.0  . Smokeless tobacco: Never Used  . Tobacco comment: "quit smoking ~ 1980  Substance Use Topics  . Alcohol use: Not Currently    Alcohol/week: 0.0 standard drinks    Comment: 04/10/2016 "I'll have a drink on holidays/special occasions"  . Drug use: No    Family History Family History  Problem Relation Age of Onset  . Stroke Mother   . Renal Disease Father     Allergies  Allergen Reactions  . Ace Inhibitors     Cough  . Doxycycline Nausea Only  . Hydrocodone Itching and Nausea Only  . Mercury     Other reaction(s): Unknown  . Silver Dermatitis    Severe itching  . Cephalexin Rash  . Clindamycin/Lincomycin Rash  . Nickel Rash  . Penicillins Rash    Has patient had a PCN reaction causing immediate rash, facial/tongue/throat swelling, SOB or lightheadedness with hypotension: YES Has patient had a PCN reaction causing severe rash involving mucus membranes or skin necrosis: NO Has patient had a PCN reaction that required  hospitalization NO Has patient had a PCN reaction occurring within the last 10 years: NO If all of the above answers are "NO", then may proceed with Cephalosporin use.     REVIEW OF SYSTEMS (Negative unless checked)  Constitutional: [] Weight loss  [] Fever  [] Chills Cardiac: [] Chest pain   [] Chest pressure   [] Palpitations   [] Shortness of breath when laying flat   [x] Shortness of breath with exertion. Vascular:  [] Pain in legs with walking   [] Pain in legs at rest  [] History of DVT   [] Phlebitis   [x] Swelling in legs   [] Varicose veins   [] Non-healing ulcers Pulmonary:   [] Uses home oxygen   [] Productive cough   [] Hemoptysis   [] Wheeze  [] COPD   [] Asthma Neurologic:  [] Dizziness   [] Seizures   [] History of stroke   []   History of TIA  [] Aphasia   [] Vissual changes   [] Weakness or numbness in arm   [] Weakness or numbness in leg Musculoskeletal:   [] Joint swelling   [x] Joint pain   [] Low back pain Hematologic:  [x] Easy bruising  [] Easy bleeding   [] Hypercoagulable state   [] Anemic Gastrointestinal:  [] Diarrhea   [] Vomiting  [] Gastroesophageal reflux/heartburn   [] Difficulty swallowing. Genitourinary:  [] Chronic kidney disease   [] Difficult urination  [] Frequent urination   [] Blood in urine Skin:  [x] Rashes   [] Ulcers  Psychological:  [] History of anxiety   []  History of major depression.  Physical Examination  Vitals:   07/23/18 1402  BP: 121/67  Pulse: 85  Resp: 16  Weight: 148 lb (67.1 kg)  Height: 5' 1.6" (1.565 m)   Body mass index is 27.42 kg/m. Gen: WD/WN, NAD Head: [place]/AT, No temporalis wasting.  Ear/Nose/Throat: Hearing grossly intact, nares w/o erythema or drainage Eyes: PER, EOMI, sclera nonicteric.  Neck: Supple, no large masses.   Pulmonary:  Good air movement, no audible wheezing bilaterally, no use of accessory muscles.  Cardiac: RRR, no JVD Vascular: scattered varicosities present bilaterally.  Moderate venous stasis changes to the legs bilaterally.  Scars from healed  venous ulcers left leg.  2+ soft pitting edema Vessel Right Left  Radial Palpable Palpable  Brachial Palpable Palpable  PT Palpable Palpable  DP Palpable Palpable  Gastrointestinal: Non-distended. No guarding/no peritoneal signs.  Musculoskeletal: M/S 5/5 throughout.  No deformity or atrophy.  Neurologic: CN 2-12 intact. Symmetrical.  Speech is fluent. Motor exam as listed above. Psychiatric: Judgment intact, Mood & affect appropriate for pt's clinical situation. Dermatologic: Moderate venous rashes with healed ulcers noted.  No changes consistent with cellulitis. Lymph : No lichenification or skin changes of chronic lymphedema.  CBC Lab Results  Component Value Date   WBC 3.7 07/01/2016   HGB 11.7 (L) 07/01/2016   HCT 35.5 07/01/2016   MCV 82.3 07/01/2016   PLT 220 07/01/2016    BMET    Component Value Date/Time   NA 139 09/29/2017 1214   NA 143 03/27/2012 0418   K 5.5 (H) 09/29/2017 1214   K 3.9 02/20/2015 0630   CL 96 09/29/2017 1214   CL 111 (H) 03/27/2012 0418   CO2 19 (L) 09/29/2017 1214   CO2 21 03/27/2012 0418   GLUCOSE 76 09/29/2017 1214   GLUCOSE 105 (H) 09/01/2017 1218   GLUCOSE 96 03/27/2012 0418   BUN 55 (H) 09/29/2017 1214   BUN 10 03/27/2012 0418   CREATININE 1.82 (H) 09/29/2017 1214   CREATININE 0.84 03/27/2012 0418   CALCIUM 9.8 09/29/2017 1214   CALCIUM 8.3 (L) 03/27/2012 0418   GFRNONAA 26 (L) 09/29/2017 1214   GFRNONAA >60 03/27/2012 0418   GFRAA 30 (L) 09/29/2017 1214   GFRAA >60 03/27/2012 0418   CrCl cannot be calculated (Patient's most recent lab result is older than the maximum 21 days allowed.).  COAG Lab Results  Component Value Date   INR 2.9 07/08/2018   INR 2.6 06/10/2018   INR 3.2 (A) 05/25/2018    Radiology No results found.  Assessment/Plan 1. Chronic venous insufficiency No surgery or intervention at this point in time.    I have had a long discussion with the patient regarding venous insufficiency and why it  causes  symptoms. I have discussed with the patient the chronic skin changes that accompany venous insufficiency and the long term sequela such as infection and ulceration.  Patient will begin wearing graduated compression stockings  class 1 (20-30 mmHg) or compression wraps on a daily basis a prescription was given. The patient will put the stockings on first thing in the morning and removing them in the evening. The patient is instructed specifically not to sleep in the stockings.    In addition, behavioral modification including several periods of elevation of the lower extremities during the day will be continued. I have demonstrated that proper elevation is a position with the ankles at heart level.  The patient is instructed to begin routine exercise, especially walking on a daily basis   2. Lymphedema  No surgery or intervention at this point in time.    I have reviewed my discussion with the patient regarding lymphedema and why it  causes symptoms.  Patient will continue wearing graduated compression stockings class 1 (20-30 mmHg) on a daily basis a prescription was given. The patient is reminded to put the stockings on first thing in the morning and removing them in the evening. The patient is instructed specifically not to sleep in the stockings.   In addition, behavioral modification throughout the day will be continued.  This will include frequent elevation (such as in a recliner), use of over the counter pain medications as needed and exercise such as walking.  I have reviewed systemic causes for chronic edema such as liver, kidney and cardiac etiologies and there does not appear to be any significant changes in these organ systems over the past year.  The patient is under the impression that these organ systems are all stable and unchanged.    The patient will continue aggressive use of the  lymph pump.  This will continue to improve the edema control and prevent sequela such as ulcers and  infections.   The patient will follow-up with me on an annual basis.    3. Paroxysmal atrial fibrillation (HCC) Continue antiarrhythmia medications as already ordered, these medications have been reviewed and there are no changes at this time.  Continue anticoagulation as ordered by Cardiology Service   4. Chronic systolic heart failure (HCC) Continue cardiac and antihypertensive medications as already ordered and reviewed, no changes at this time.  Continue statin as ordered and reviewed, no changes at this time  Nitrates PRN for chest pain   5. Mixed hyperlipidemia Continue statin as ordered and reviewed, no changes at this time     Hortencia Pilar, MD  07/25/2018 2:38 PM

## 2018-07-27 DIAGNOSIS — J9611 Chronic respiratory failure with hypoxia: Secondary | ICD-10-CM | POA: Diagnosis not present

## 2018-07-27 DIAGNOSIS — Z9981 Dependence on supplemental oxygen: Principal | ICD-10-CM

## 2018-07-27 NOTE — Progress Notes (Signed)
Daily Session Note  Patient Details  Name: NEFTALI THUROW MRN: [id number] Date of Birth: 30-[name]-1935 Referring Provider:     Pulmonary Rehab from 07/21/2018 in [facility] Cardiac and Pulmonary Rehab  Referring Provider  [name] MD      Encounter Date: 07/27/2018  Check In:      Social History   Tobacco Use  Smoking Status Former Smoker  . Packs/day: 1.00  . Years: 27.00  . Pack years: 27.00  . Types: Cigarettes  . Last attempt to quit: 07/01/1980  . Years since quitting: 38.0  Smokeless Tobacco Never Used  Tobacco Comment   "quit smoking ~ 1980    Goals Met:  Proper associated with RPD/PD & O2 Sat Independence with exercise equipment Exercise tolerated well Strength training completed today  Goals Unmet:  Not Applicable  Comments: First full day of exercise!  Patient was oriented to gym and equipment including functions, settings, policies, and procedures.  Patient's individual exercise prescription and treatment plan were reviewed.  All starting workloads were established based on the results of the 6 minute walk test done at initial orientation visit.  The plan for exercise progression was also introduced and progression will be customized based on patient's performance and goals.    Dr. [name] is Medical Director for [place] and [facility].

## 2018-07-29 ENCOUNTER — Encounter: Payer: Medicare Other | Admitting: *Deleted

## 2018-07-29 DIAGNOSIS — J9611 Chronic respiratory failure with hypoxia: Secondary | ICD-10-CM | POA: Diagnosis not present

## 2018-07-29 DIAGNOSIS — Z9981 Dependence on supplemental oxygen: Principal | ICD-10-CM

## 2018-07-29 NOTE — Progress Notes (Signed)
Daily Session Note  Patient Details  Name: [name] MRN: [id number] Date of Birth: Apr 10, 1935 Referring Provider:     Pulmonary Rehab from 07/21/2018 in [facility] Cardiac and Pulmonary Rehab  Referring Provider  [name] MD      Encounter Date: 07/29/2018  Check In: Session Check In - 07/29/18 1136      Check-In   Supervising physician immediately available to respond to emergencies  [facility] immediately available ER MD    Physician(s)  Drs. Domenic Polite    Location  [facility]-Cardiac & Pulmonary Rehab    Staff Present  Renita Papa, RN BSN;Sayan Aldava Luan Pulling, [place], RCEP, CCRP, Exercise Physiologist;Joseph Tessie Fass RCP,RRT,BSRT    Medication changes reported      No    Fall or balance concerns reported     No    Warm-up and Cool-down  Performed as group-led instruction    Resistance Training Performed  Yes    VAD Patient?  No    PAD/SET Patient?  No      Pain Assessment   Currently in Pain?  No/denies          Social History   Tobacco Use  Smoking Status Former Smoker  . Packs/day: 1.00  . Years: 27.00  . Pack years: 27.00  . Types: Cigarettes  . Last attempt to quit: 07/01/1980  . Years since quitting: 38.1  Smokeless Tobacco Never Used  Tobacco Comment   "quit smoking ~ 1980    Goals Met:  Proper associated with RPD/PD & O2 Sat Exercise tolerated well Queuing for purse lip breathing No report of cardiac concerns or symptoms Strength training completed today  Goals Unmet:  Not Applicable  Comments: Pt able to follow exercise prescription today without complaint.  Will continue to monitor for progression.    Dr. [name] is Medical Director for [place] and [facility].

## 2018-08-03 ENCOUNTER — Encounter: Payer: Medicare Other | Attending: Internal Medicine

## 2018-08-03 DIAGNOSIS — N189 Chronic kidney disease, unspecified: Secondary | ICD-10-CM | POA: Insufficient documentation

## 2018-08-03 DIAGNOSIS — I130 Hypertensive heart and chronic kidney disease with heart failure and stage 1 through stage 4 chronic kidney disease, or unspecified chronic kidney disease: Secondary | ICD-10-CM | POA: Insufficient documentation

## 2018-08-03 DIAGNOSIS — Z7901 Long term (current) use of anticoagulants: Secondary | ICD-10-CM | POA: Insufficient documentation

## 2018-08-03 DIAGNOSIS — I5042 Chronic combined systolic (congestive) and diastolic (congestive) heart failure: Secondary | ICD-10-CM | POA: Insufficient documentation

## 2018-08-03 DIAGNOSIS — Z87891 Personal history of nicotine dependence: Secondary | ICD-10-CM | POA: Diagnosis not present

## 2018-08-03 DIAGNOSIS — Z9981 Dependence on supplemental oxygen: Secondary | ICD-10-CM | POA: Insufficient documentation

## 2018-08-03 DIAGNOSIS — K219 Gastro-esophageal reflux disease without esophagitis: Secondary | ICD-10-CM | POA: Diagnosis not present

## 2018-08-03 DIAGNOSIS — J9611 Chronic respiratory failure with hypoxia: Secondary | ICD-10-CM | POA: Diagnosis not present

## 2018-08-03 DIAGNOSIS — I482 Chronic atrial fibrillation, unspecified: Secondary | ICD-10-CM | POA: Diagnosis not present

## 2018-08-03 NOTE — Progress Notes (Signed)
Daily Session Note  Patient Details  Name: [name] MRN: [id number] Date of Birth: 04/20/1935 Referring Provider:     Pulmonary Rehab from 07/21/2018 in [facility] Cardiac and Pulmonary Rehab  Referring Provider  [name] MD      Encounter Date: 08/03/2018  Check In:      Social History   Tobacco Use  Smoking Status Former Smoker  . Packs/day: 1.00  . Years: 27.00  . Pack years: 27.00  . Types: Cigarettes  . Last attempt to quit: 07/01/1980  . Years since quitting: 38.1  Smokeless Tobacco Never Used  Tobacco Comment   "quit smoking ~ 1980    Goals Met:  Proper associated with RPD/PD & O2 Sat Independence with exercise equipment Exercise tolerated well Strength training completed today  Goals Unmet:  Not Applicable  Comments: Pt able to follow exercise prescription today without complaint.  Will continue to monitor for progression.    Dr. [name] is Medical Director for [place] and [facility].

## 2018-08-05 ENCOUNTER — Encounter: Payer: Medicare Other | Admitting: *Deleted

## 2018-08-05 ENCOUNTER — Ambulatory Visit (INDEPENDENT_AMBULATORY_CARE_PROVIDER_SITE_OTHER): Payer: Medicare Other

## 2018-08-05 DIAGNOSIS — Z952 Presence of prosthetic heart valve: Secondary | ICD-10-CM

## 2018-08-05 DIAGNOSIS — J9611 Chronic respiratory failure with hypoxia: Secondary | ICD-10-CM

## 2018-08-05 DIAGNOSIS — I480 Paroxysmal atrial fibrillation: Secondary | ICD-10-CM | POA: Diagnosis not present

## 2018-08-05 DIAGNOSIS — Z5181 Encounter for therapeutic drug level monitoring: Secondary | ICD-10-CM | POA: Diagnosis not present

## 2018-08-05 DIAGNOSIS — Z9981 Dependence on supplemental oxygen: Principal | ICD-10-CM

## 2018-08-05 LAB — POCT INR: INR: 3.4 — AB (ref 2.0–3.0)

## 2018-08-05 NOTE — Patient Instructions (Signed)
Please continue dosage of 3mg  (1/2 tablet) daily, EXCEPT WHOLE PILL ON [place]. Recheck in 4 weeks.

## 2018-08-05 NOTE — Progress Notes (Signed)
Daily Session Note  Patient Details  Name: [name] MRN: [id number] Date of Birth: 04/25/1935 Referring Provider:     Pulmonary Rehab from 07/21/2018 in [facility] Cardiac and Pulmonary Rehab  Referring Provider  [name] MD      Encounter Date: 08/05/2018  Check In: Session Check In - 08/05/18 1145      Check-In   Supervising physician immediately available to respond to emergencies  [facility] immediately available ER MD    Physician(s)  Dr. Jimmye Norman and Alfred Levins    Location  [facility]-Cardiac & Pulmonary Rehab    Staff Present  Renita Papa, RN BSN;Jessica Luan Pulling, MA, RCEP, CCRP, Exercise Physiologist;Joseph Tessie Fass RCP,RRT,BSRT    Medication changes reported      No    Fall or balance concerns reported     No    Warm-up and Cool-down  Performed as group-led instruction    Resistance Training Performed  Yes    VAD Patient?  No    PAD/SET Patient?  No      VAD patient   Has back up controller?  No      Pain Assessment   Currently in Pain?  No/denies          Social History   Tobacco Use  Smoking Status Former Smoker  . Packs/day: 1.00  . Years: 27.00  . Pack years: 27.00  . Types: Cigarettes  . Last attempt to quit: 07/01/1980  . Years since quitting: 38.1  Smokeless Tobacco Never Used  Tobacco Comment   "quit smoking ~ 1980    Goals Met:  Proper associated with RPD/PD & O2 Sat Independence with exercise equipment Exercise tolerated well No report of cardiac concerns or symptoms Strength training completed today  Goals Unmet:  Not Applicable  Comments: Pt able to follow exercise prescription today without complaint.  Will continue to monitor for progression.    Dr. [name] is Medical Director for [place] and [facility].

## 2018-08-07 ENCOUNTER — Encounter: Payer: Medicare Other | Admitting: *Deleted

## 2018-08-07 DIAGNOSIS — Z9981 Dependence on supplemental oxygen: Principal | ICD-10-CM

## 2018-08-07 DIAGNOSIS — J9611 Chronic respiratory failure with hypoxia: Secondary | ICD-10-CM

## 2018-08-07 NOTE — Progress Notes (Signed)
Daily Session Note  Patient Details  Name: [name] MRN: [id number] Date of Birth: Mar 17, 1935 Referring Provider:     Pulmonary Rehab from 07/21/2018 in [facility]  Referring Provider  [name] MD      Encounter Date: 08/07/2018  Check In: Session Check In - 08/07/18 1132      Check-In   Supervising physician immediately available to respond to emergencies  [facility] immediately available ER MD    Physician(s)  Drs. Filiberto Pinks    Location  [facility]-Cardiac & Pulmonary Rehab    Staff Present  Renita Papa, RN BSN;Marcello Tuzzolino Luan [place], [place], RCEP, CCRP, Exercise Physiologist;Joseph Tessie Fass RCP,RRT,BSRT    Medication changes reported      No    Fall or balance concerns reported     No    Warm-up and Cool-down  Performed as group-led instruction    Resistance Training Performed  Yes    VAD Patient?  No    PAD/SET Patient?  No      Pain Assessment   Currently in Pain?  No/denies          Social History   Tobacco Use  Smoking Status Former Smoker  . Packs/day: 1.00  . Years: 27.00  . Pack years: 27.00  . Types: Cigarettes  . Last attempt to quit: 07/01/1980  . Years since quitting: 38.1  Smokeless Tobacco Never Used  Tobacco Comment   "quit smoking ~ 1980    Goals Met:  Proper associated with RPD/PD & O2 Sat Independence with exercise equipment Using PLB without cueing & demonstrates good technique Exercise tolerated well No report of cardiac concerns or symptoms Strength training completed today  Goals Unmet:  Not Applicable  Comments: Pt able to follow exercise prescription today without complaint.  Will continue to monitor for progression.    Dr. [name] is Medical Director for [facility] and [facility].

## 2018-08-10 DIAGNOSIS — J9611 Chronic respiratory failure with hypoxia: Secondary | ICD-10-CM | POA: Diagnosis not present

## 2018-08-10 DIAGNOSIS — Z9981 Dependence on supplemental oxygen: Principal | ICD-10-CM

## 2018-08-10 NOTE — Progress Notes (Signed)
Pulmonary Individual Treatment Plan  Patient Details  Name: [name] MRN: [id number] Date of Birth: [birth year] Referring Provider:     Pulmonary Rehab from 07/21/2018 in [facility]  Referring Provider  [name] MD      Initial Encounter Date:    Pulmonary Rehab from 07/21/2018 in [facility]  Date  07/21/18      Visit Diagnosis: Chronic respiratory failure with hypoxia, on home O2 therapy ([facility])  Patient's Home Medications on Admission:  Current Outpatient Medications:  .  acetaminophen (TYLENOL) 650 MG CR tablet, Take 650 mg by mouth every 8 (eight) hours as needed for pain., Disp: , Rfl:  .  amiodarone (PACERONE) 200 MG tablet, TAKE 1 TABLET BY MOUTH DAILY, Disp: 90 tablet, Rfl: 3 .  aspirin EC 81 MG tablet, Take 81 mg by mouth daily., Disp: , Rfl:  .  Cholecalciferol (VITAMIN D) 2000 units tablet, Take 2,000 Units by mouth daily., Disp: , Rfl:  .  furosemide (LASIX) 20 MG tablet, Take 1 tablet (20 mg total) by mouth 3 (three) times daily., Disp: 270 tablet, Rfl: 3 .  KLOR-CON 10 10 MEQ tablet, Take 10 mEq by mouth daily. , Disp: , Rfl:  .  losartan (COZAAR) 25 MG tablet, TAKE 1/2 TABLET EVERY DAY, Disp: 30 tablet, Rfl: 3 .  Menthol (ICY HOT) 5 % PTCH, Apply 1 patch topically daily as needed (pain)., Disp: , Rfl:  .  metolazone (ZAROXOLYN) 5 MG tablet, Take 1 tablet (5 mg total) by mouth as needed (As needed for weight greater than 148)., Disp: 90 tablet, Rfl: 3 .  Multiple Vitamin (MULTIVITAMIN WITH MINERALS) TABS tablet, Take 1 tablet by mouth daily., Disp: , Rfl:  .  Probiotic Product (RISA-BID PROBIOTIC PO), Take 1 capsule by mouth daily. , Disp: , Rfl:  .  Simethicone (GAS-X PO), Take by mouth., Disp: , Rfl:  .  spironolactone (ALDACTONE) 25 MG tablet, Take 25 mg by mouth daily. , Disp: , Rfl:  .  vitamin C (ASCORBIC ACID) 500 MG tablet, Take 500 mg by mouth daily., Disp: , Rfl:  .  warfarin (COUMADIN) 6 MG tablet, Take  3-6 mg by mouth See admin instructions. Take 3 mg once daily at night except for Wednesdays take 6 mg once nightly, Disp: , Rfl:   Past Medical History: Past Medical History:  Diagnosis Date  . Arthritis    "back, hands, knees" (04/10/2016)  . Chronic atrial fibrillation    a. On Coumadin  . Chronic back pain   . 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 EF 25% by echo in 08/2015 b. RHC in 08/2015 showed normal filling pressures  . Chronic kidney disease   . Compression fracture    "several; all in my back" (04/10/2016)  . GERD (gastroesophageal reflux disease)   . Hypertension   . Lymphedema   . S/P MVR (mitral valve replacement)    a. MVR 1994 b. redo MVR in 08/2014 - on Coumadin  . Shortness of breath dyspnea     Tobacco Use: Social History   Tobacco Use  Smoking Status Former Smoker  . Packs/day: 1.00  . Years: 27.00  . Pack years: 27.00  . Types: Cigarettes  . Last attempt to quit: 07/01/1980  . Years since quitting: 38.1  Smokeless Tobacco Never Used  Tobacco Comment   "quit smoking ~ 1980    Labs: Recent Review [employer]  There is no flowsheet data to display.       Pulmonary Assessment Scores: Pulmonary Assessment Scores    Row Name 07/21/18 1451         ADL UCSD   ADL Phase  Entry     SOB Score total  100     Rest  3     Walk  3     Stairs  4     Bath  5     Dress  5     Shop  5       CAT Score   CAT Score  24       mMRC Score   mMRC Score  4        Pulmonary Function Assessment: Pulmonary Function Assessment - 07/21/18 1511      Breath   Bilateral Breath Sounds  Clear;Decreased    Shortness of Breath  Yes;Limiting activity       Exercise Target Goals: Exercise Program Goal: Individual exercise prescription set using results from initial 6 min walk test and THRR while considering  patient's activity barriers and safety.   Exercise Prescription Goal: Initial exercise  prescription builds to 30-45 minutes a day of aerobic activity, 2-3 days per week.  Home exercise guidelines will be given to patient during program as part of exercise prescription that the participant will acknowledge.  Activity Barriers & Risk Stratification: Activity Barriers & Cardiac Risk Stratification - 07/21/18 1520      Activity Barriers & Cardiac Risk Stratification   Activity Barriers  Arthritis;Back Problems;Deconditioning;Muscular Weakness;Shortness of Breath;Balance Concerns;Assistive Device   hx of multiple back fractures      6 Minute Walk: 6 Minute Walk    Row Name 07/21/18 1517         6 Minute Walk   Phase  Initial     Distance  580 feet     Walk Time  6 minutes     # of Rest Breaks  0     MPH  1.09     METS  1.01     RPE  11     Perceived Dyspnea   3     VO2 Peak  3.54     Symptoms  Yes (comment)     Comments  chronic back pain     Resting HR  72 bpm     Resting BP  126/62     Resting Oxygen Saturation   94 %     Exercise Oxygen Saturation  during 6 min walk  80 %     Max Ex. HR  83 bpm     Max Ex. BP  164/74     2 Minute Post BP  136/64       Interval HR   1 Minute HR  72     2 Minute HR  76     3 Minute HR  79     4 Minute HR  69     5 Minute HR  71     6 Minute HR  83     2 Minute Post HR  73     Interval Heart Rate?  Yes       Interval Oxygen   Interval Oxygen?  Yes     Baseline Oxygen Saturation %  94 %     1 Minute Oxygen Saturation %  88 %     1 Minute Liters of Oxygen  2 L continuous  2 Minute Oxygen Saturation %  83 %     2 Minute Liters of Oxygen  2 L     3 Minute Oxygen Saturation %  82 %     3 Minute Liters of Oxygen  2 L     4 Minute Oxygen Saturation %  82 %     4 Minute Liters of Oxygen  2 L     5 Minute Oxygen Saturation %  80 %     5 Minute Liters of Oxygen  2 L     6 Minute Oxygen Saturation %  87 %     6 Minute Liters of Oxygen  2 L     2 Minute Post Oxygen Saturation %  98 %     2 Minute Post Liters of Oxygen   2 L       Oxygen Initial Assessment: Oxygen Initial Assessment - 07/21/18 1510      Home Oxygen   Home Oxygen Device  Home Concentrator;E-Tanks    Sleep Oxygen Prescription  Continuous    Liters per minute  2    Home Exercise Oxygen Prescription  Continuous    Liters per minute  2    Home at Rest Exercise Oxygen Prescription  Continuous    Liters per minute  2    Compliance with Home Oxygen Use  Yes      Initial 6 min Walk   Oxygen Used  Continuous;E-Tanks    Liters per minute  2      Program Oxygen Prescription   Program Oxygen Prescription  Continuous    Liters per minute  3      Intervention   Short Term Goals  To learn and exhibit compliance with exercise, home and travel O2 prescription;To learn and understand importance of monitoring SPO2 with pulse oximeter and demonstrate accurate use of the pulse oximeter.;To learn and understand importance of maintaining oxygen saturations>88%;To learn and demonstrate proper pursed lip breathing techniques or other breathing techniques.    Long  Term Goals  Verbalizes importance of monitoring SPO2 with pulse oximeter and return demonstration;Exhibits compliance with exercise, home and travel O2 prescription;Maintenance of O2 saturations>88%;Exhibits proper breathing techniques, such as pursed lip breathing or other method taught during program session;Compliance with respiratory medication       Oxygen Re-Evaluation: Oxygen Re-Evaluation    Row Name 07/27/18 1345             Program Oxygen Prescription   Program Oxygen Prescription  Continuous       Liters per minute  3         Home Oxygen   Home Oxygen Device  Home Concentrator;E-Tanks       Sleep Oxygen Prescription  Continuous       Liters per minute  2       Home Exercise Oxygen Prescription  Continuous       Liters per minute  2       Home at Rest Exercise Oxygen Prescription  Continuous       Liters per minute  2       Compliance with Home Oxygen Use  Yes          Goals/Expected Outcomes   Short Term Goals  To learn and exhibit compliance with exercise, home and travel O2 prescription;To learn and understand importance of monitoring SPO2 with pulse oximeter and demonstrate accurate use of the pulse oximeter.;To learn and understand importance of maintaining oxygen saturations>88%;To learn and demonstrate  proper pursed lip breathing techniques or other breathing techniques.       Long  Term Goals  Verbalizes importance of monitoring SPO2 with pulse oximeter and return demonstration;Exhibits compliance with exercise, home and travel O2 prescription;Maintenance of O2 saturations>88%;Exhibits proper breathing techniques, such as pursed lip breathing or other method taught during program session;Compliance with respiratory medication       Comments  Reviewed PLB technique with pt.  Talked about how it work and it's important to maintaining his exercise saturations.        Goals/Expected Outcomes  Short: Become more profiecient at using PLB.   Long: Become independent at using PLB.          Oxygen Discharge (Final Oxygen Re-Evaluation): Oxygen Re-Evaluation - 07/27/18 1345      Program Oxygen Prescription   Program Oxygen Prescription  Continuous    Liters per minute  3      Home Oxygen   Home Oxygen Device  Home Concentrator;E-Tanks    Sleep Oxygen Prescription  Continuous    Liters per minute  2    Home Exercise Oxygen Prescription  Continuous    Liters per minute  2    Home at Rest Exercise Oxygen Prescription  Continuous    Liters per minute  2    Compliance with Home Oxygen Use  Yes      Goals/Expected Outcomes   Short Term Goals  To learn and exhibit compliance with exercise, home and travel O2 prescription;To learn and understand importance of monitoring SPO2 with pulse oximeter and demonstrate accurate use of the pulse oximeter.;To learn and understand importance of maintaining oxygen saturations>88%;To learn and demonstrate proper pursed lip  breathing techniques or other breathing techniques.    Long  Term Goals  Verbalizes importance of monitoring SPO2 with pulse oximeter and return demonstration;Exhibits compliance with exercise, home and travel O2 prescription;Maintenance of O2 saturations>88%;Exhibits proper breathing techniques, such as pursed lip breathing or other method taught during program session;Compliance with respiratory medication    Comments  Reviewed PLB technique with pt.  Talked about how it work and it's important to maintaining his exercise saturations.     Goals/Expected Outcomes  Short: Become more profiecient at using PLB.   Long: Become independent at using PLB.       Initial Exercise Prescription: Initial Exercise Prescription - 07/21/18 1500      Date of Initial Exercise RX and Referring Provider   Date  07/21/18    Referring Provider  [name] MD      Oxygen   Oxygen  Continuous    Liters  2      T5 Nustep   Level  1    SPM  80    Minutes  15    METs  1.5      Biostep-RELP   Level  1    SPM  50    Minutes  15    METs  1      Track   Laps  11    Minutes  15    METs  1.5      Prescription Details   Frequency (times per week)  3    Duration  Progress to 45 minutes of aerobic exercise without signs/symptoms of physical distress      Intensity   THRR 40-80% of Max Heartrate  99-126    Ratings of Perceived Exertion  11-13    Perceived Dyspnea  0-4      Progression  Progression  Continue to progress workloads to maintain intensity without signs/symptoms of physical distress.      Resistance Training   Training Prescription  Yes    Weight  3 lbs    Reps  10-15       Perform Capillary Blood Glucose checks as needed.  Exercise Prescription Changes: Exercise Prescription Changes    Row Name 07/21/18 1500 07/28/18 1400           Response to Exercise   Blood Pressure (Admit)  126/62  112/60      Blood Pressure (Exercise)  164/74  112/60      Blood Pressure (Exit)   136/64  120/62      Heart Rate (Admit)  72 bpm  73 bpm      Heart Rate (Exercise)  83 bpm  80 bpm      Heart Rate (Exit)  73 bpm  61 bpm      Oxygen Saturation (Admit)  94 %  95 %      Oxygen Saturation (Exercise)  80 %  89 %      Oxygen Saturation (Exit)  98 %  99 %      Rating of Perceived Exertion (Exercise)  11  15      Perceived Dyspnea (Exercise)  3  2      Symptoms  chronic back pain and SOB  SOB      Comments  walk test results  first full day of exercise      Duration  -  Progress to 45 minutes of aerobic exercise without signs/symptoms of physical distress      Intensity  -  THRR unchanged        Progression   Progression  -  Continue to progress workloads to maintain intensity without signs/symptoms of physical distress.      Average METs  -  1.67        Resistance Training   Training Prescription  -  Yes      Weight  -  3 lbs      Reps  -  10-15        Interval Training   Interval Training  -  No        Oxygen   Oxygen  -  Continuous      Liters  -  3        T5 Nustep   Level  -  1      Minutes  -  15      METs  -  1.5        Biostep-RELP   Level  -  1      Minutes  -  15      METs  -  2        Track   Laps  -  11      Minutes  -  15      METs  -  1.5         Exercise Comments: Exercise Comments    Row Name 07/27/18 1251           Exercise Comments  First full day of exercise!  Patient was oriented to gym and equipment including functions, settings, policies, and procedures.  Patient's individual exercise prescription and treatment plan were reviewed.  All starting workloads were established based on the results of the 6 minute walk test done at initial orientation visit.  The plan for exercise progression was  also introduced and progression will be customized based on patient's performance and goals.          Exercise Goals and Review: Exercise Goals    Row Name 07/21/18 1524             Exercise Goals   Increase Physical Activity  Yes        Intervention  Provide advice, education, support and counseling about physical activity/exercise needs.;Develop an individualized exercise prescription for aerobic and resistive training based on initial evaluation findings, risk stratification, comorbidities and participant's personal goals.       Expected Outcomes  Short Term: Attend rehab on a regular basis to increase amount of physical activity.;Long Term: Add in home exercise to make exercise part of routine and to increase amount of physical activity.;Long Term: Exercising regularly at least 3-5 days a week.       Increase Strength and Stamina  Yes       Intervention  Develop an individualized exercise prescription for aerobic and resistive training based on initial evaluation findings, risk stratification, comorbidities and participant's personal goals.;Provide advice, education, support and counseling about physical activity/exercise needs.       Expected Outcomes  Short Term: Increase workloads from initial exercise prescription for resistance, speed, and METs.;Long Term: Improve cardiorespiratory fitness, muscular endurance and strength as measured by increased METs and functional capacity (6MWT);Short Term: Perform resistance training exercises routinely during rehab and add in resistance training at home       Able to understand and use rate of perceived exertion (RPE) scale  Yes       Intervention  Provide education and explanation on how to use RPE scale       Expected Outcomes  Short Term: Able to use RPE daily in rehab to express subjective intensity level;Long Term:  Able to use RPE to guide intensity level when exercising independently       Able to understand and use Dyspnea scale  Yes       Intervention  Provide education and explanation on how to use Dyspnea scale       Expected Outcomes  Short Term: Able to use Dyspnea scale daily in rehab to express subjective sense of shortness of breath during exertion;Long Term: Able to use  Dyspnea scale to guide intensity level when exercising independently       Knowledge and understanding of Target Heart Rate Range (THRR)  Yes       Intervention  Provide education and explanation of THRR including how the numbers were predicted and where they are located for reference       Expected Outcomes  Short Term: Able to state/look up THRR;Short Term: Able to use daily as guideline for intensity in rehab;Long Term: Able to use THRR to govern intensity when exercising independently       Able to check pulse independently  Yes       Intervention  Provide education and demonstration on how to check pulse in carotid and radial arteries.;Review the importance of being able to check your own pulse for safety during independent exercise       Expected Outcomes  Short Term: Able to explain why pulse checking is important during independent exercise;Long Term: Able to check pulse independently and accurately       Understanding of Exercise Prescription  Yes       Intervention  Provide education, explanation, and written materials on patient's individual exercise prescription       Expected  Outcomes  Short Term: Able to explain program exercise prescription;Long Term: Able to explain home exercise prescription to exercise independently          Exercise Goals Re-Evaluation : Exercise Goals Re-Evaluation    Row Name 07/27/18 1251             Exercise Goal Re-Evaluation   Exercise Goals Review  Increase Physical Activity;Able to understand and use rate of perceived exertion (RPE) scale;Increase Strength and Stamina;Able to understand and use Dyspnea scale       Comments  Reviewed RPE scale, THR and program prescription with pt today.  Pt voiced understanding and was given a copy of goals to take home       Expected Outcomes  Short: Use RPE daily to regulate intensity. Long: Follow program prescription in THR.          Discharge Exercise Prescription (Final Exercise Prescription  Changes): Exercise Prescription Changes - 07/28/18 1400      Response to Exercise   Blood Pressure (Admit)  112/60    Blood Pressure (Exercise)  112/60    Blood Pressure (Exit)  120/62    Heart Rate (Admit)  73 bpm    Heart Rate (Exercise)  80 bpm    Heart Rate (Exit)  61 bpm    Oxygen Saturation (Admit)  95 %    Oxygen Saturation (Exercise)  89 %    Oxygen Saturation (Exit)  99 %    Rating of Perceived Exertion (Exercise)  15    Perceived Dyspnea (Exercise)  2    Symptoms  SOB    Comments  first full day of exercise    Duration  Progress to 45 minutes of aerobic exercise without signs/symptoms of physical distress    Intensity  THRR unchanged      Progression   Progression  Continue to progress workloads to maintain intensity without signs/symptoms of physical distress.    Average METs  1.67      Resistance Training   Training Prescription  Yes    Weight  3 lbs    Reps  10-15      Interval Training   Interval Training  No      Oxygen   Oxygen  Continuous    Liters  3      T5 Nustep   Level  1    Minutes  15    METs  1.5      Biostep-RELP   Level  1    Minutes  15    METs  2      Track   Laps  11    Minutes  15    METs  1.5       Nutrition:  Target Goals: Understanding of nutrition guidelines, daily intake of sodium <1567m, cholesterol <2025m calories 30% from fat and 7% or less from saturated fats, daily to have 5 or more servings of fruits and vegetables.  Biometrics: Pre Biometrics - 07/21/18 1525      Pre Biometrics   Height  5' 1.6" (1.565 m)    Weight  144 lb 4.8 oz (65.5 kg)    Waist Circumference  36 inches    Hip Circumference  38 inches    Waist to Hip Ratio  0.95 %    BMI (Calculated)  26.72    Single Leg Stand  1.88 seconds        Nutrition Therapy Plan and Nutrition Goals: Nutrition Therapy & Goals - 07/21/18 1513  Personal Nutrition Goals   Nutrition Goal  She does not feel like meeting with the dietician.    Comments   She wants to be healthier      Intervention Plan   Intervention  Prescribe, educate and counsel regarding individualized specific dietary modifications aiming towards targeted core components such as weight, hypertension, lipid management, diabetes, heart failure and other comorbidities.;Nutrition handout(s) given to patient.    Expected Outcomes  Short Term Goal: Understand basic principles of dietary content, such as calories, fat, sodium, cholesterol and nutrients.;Long Term Goal: Adherence to prescribed nutrition plan.       Nutrition Assessments:   Nutrition Goals Re-Evaluation:   Nutrition Goals Discharge (Final Nutrition Goals Re-Evaluation):   Psychosocial: Target Goals: Acknowledge presence or absence of significant depression and/or stress, maximize coping skills, provide positive support system. Participant is able to verbalize types and ability to use techniques and skills needed for reducing stress and depression.   Initial Review & Psychosocial Screening: Initial Psych Review & Screening - 07/21/18 1511      Initial Review   Current issues with  Current Stress Concerns    Source of Stress Concerns  Chronic Illness;Family    Comments  She states her son stresses her out. She states that he tries to be a doctor for her.      Family Dynamics   Good Support System?  Yes    Comments  She can look to her daugter for support who lives with her.      Barriers   Psychosocial barriers to participate in program  The patient should benefit from training in stress management and relaxation.      Screening Interventions   Interventions  Encouraged to exercise;To provide support and resources with identified psychosocial needs;Program counselor consult;Provide feedback about the scores to participant    Expected Outcomes  Short Term goal: Utilizing psychosocial counselor, staff and physician to assist with identification of specific Stressors or current issues interfering with  healing process. Setting desired goal for each stressor or current issue identified.;Long Term Goal: Stressors or current issues are controlled or eliminated.;Short Term goal: Identification and review with participant of any Quality of Life or Depression concerns found by scoring the questionnaire.;Long Term goal: The participant improves quality of Life and PHQ9 Scores as seen by post scores and/or verbalization of changes       Quality of Life Scores:  Scores of 19 and below usually indicate a poorer quality of life in these areas.  A difference of  2-3 points is a clinically meaningful difference.  A difference of 2-3 points in the total score of the Quality of Life Index has been associated with significant improvement in overall quality of life, self-image, physical symptoms, and general health in studies assessing change in quality of life.  PHQ-9: Recent Review Flowsheet Data    Depression screen [facility] 2/9 07/27/2018 07/21/2018 10/27/2017 09/25/2015   Decreased Interest 0 1 0 0   Down, Depressed, Hopeless 0 2 0 0   PHQ - 2 Score 0 3 0 0   Altered sleeping 3 3 - 3   Tired, decreased energy 2 2 - 3   Change in appetite 3 3 - 0   Feeling bad or failure about yourself  1 2 - 0   Trouble concentrating 2 2 - 0   Moving slowly or fidgety/restless 1 1 - 0   Suicidal thoughts 0 0 - 0   PHQ-9 Score 12 16 - 6  Difficult doing work/chores Somewhat difficult Very difficult - Somewhat difficult     Interpretation of Total Score  Total Score Depression Severity:  1-4 = Minimal depression, 5-9 = Mild depression, 10-14 = Moderate depression, 15-19 = Moderately severe depression, 20-27 = Severe depression   Psychosocial Evaluation and Intervention: Psychosocial Evaluation - 07/27/18 1230      Psychosocial Evaluation & Interventions   Interventions  Stress management education;Encouraged to exercise with the program and follow exercise prescription    Comments  [name] [name]) has returned  to this program after several years of being out.  She reports having recent back surgery and a heart valve replaced and has chronic back pain.  She has a strong support system with a son and daughter and lots of friends and neighbors.  She also has a private home health agency who comes daily in the mornings and helps her.  She reports sleeping "terrible" with about 4 hours/night and her appetite was good until recently.  She denies a history of depression or anxiety but states she has stress with her health and her adult children insisting that she do certain things like attending this program.  She has goals to "get my kids off my back!"  Staff will follow with her.    Expected Outcomes  Short:  [name] will exercise for her health and for stress management.  Long:  She will learn ways to sleep better and manage her stress in more positive ways.     Continue Psychosocial Services   Follow up required by staff       Psychosocial Re-Evaluation:   Psychosocial Discharge (Final Psychosocial Re-Evaluation):   Education: Education Goals: Education classes will be provided on a weekly basis, covering required topics. Participant will state understanding/return demonstration of topics presented.  Learning Barriers/Preferences: Learning Barriers/Preferences - 07/21/18 1516      Learning Barriers/Preferences   Learning Barriers  Sight   wears glasses   Learning Preferences  None       Education Topics:  Initial Evaluation Education: - Verbal, written and demonstration of respiratory meds, oximetry and breathing techniques. Instruction on use of nebulizers and MDIs and importance of monitoring MDI activations.   Pulmonary Rehab from 08/07/2018 in [facility] Cardiac and Pulmonary Rehab  Date  07/21/18  Educator  [facility]  Instruction Review Code  1- Verbalizes Understanding      General Nutrition Guidelines/Fats and Fiber: -Group instruction provided by verbal, written material, models and posters to  present the general guidelines for heart healthy nutrition. Gives an explanation and review of dietary fats and fiber.   Cardiac Rehab from 02/14/2016 in [facility] Cardiac and Pulmonary Rehab  Date  01/22/16  Educator  PI  Instruction Review Code (retired)  2- meets goals/outcomes      Controlling Sodium/Reading Food Labels: -Group verbal and written material supporting the discussion of sodium use in heart healthy nutrition. Review and explanation with models, verbal and written materials for utilization of the food label.   Cardiac Rehab from 02/14/2016 in [facility] Cardiac and Pulmonary Rehab  Date  01/29/16  Educator  PI  Instruction Review Code (retired)  2- meets goals/outcomes      Exercise Physiology & General Exercise Guidelines: - Group verbal and written instruction with models to review the exercise physiology of the cardiovascular system and associated critical values. Provides general exercise guidelines with specific guidelines to those with heart or lung disease.    Cardiac Rehab from 02/14/2016 in [facility] Cardiac and Pulmonary Rehab  Date  02/05/16  Educator  [facility]  Instruction Review Code (retired)  2- meets goals/outcomes      Aerobic Exercise & Resistance Training: - Gives group verbal and written instruction on the various components of exercise. Focuses on aerobic and resistive training programs and the benefits of this training and how to safely progress through these programs.   Cardiac Rehab from 02/14/2016 in [facility] Cardiac and Pulmonary Rehab  Date  02/07/16  Educator  Nada Maclachlan  Instruction Review Code (retired)  2- Statistician, Balance, Mind/Body Relaxation: Provides group verbal/written instruction on the benefits of flexibility and balance training, including mind/body exercise modes such as yoga, pilates and tai chi.  Demonstration and skill practice provided.   Pulmonary Rehab from 08/07/2018 in [facility] Cardiac and Pulmonary Rehab  Date   07/29/18  Educator  AS  Instruction Review Code  1- Verbalizes Understanding      Stress and Anxiety: - Provides group verbal and written instruction about the health risks of elevated stress and causes of high stress.  Discuss the correlation between heart/lung disease and anxiety and treatment options. Review healthy ways to manage with stress and anxiety.   Pulmonary Rehab from 08/07/2018 in [facility] Cardiac and Pulmonary Rehab  Date  08/05/18  Educator  [facility]  Instruction Review Code  1- Verbalizes Understanding      Depression: - Provides group verbal and written instruction on the correlation between heart/lung disease and depressed mood, treatment options, and the stigmas associated with seeking treatment.   Cardiac Rehab from 02/14/2016 in [facility] Cardiac and Pulmonary Rehab  Date  11/09/14  Educator  [facility]  Instruction Review Code (retired)  2- meets goals/outcomes      Exercise & Equipment Safety: - Individual verbal instruction and demonstration of equipment use and safety with use of the equipment.   Pulmonary Rehab from 08/07/2018 in [facility] Cardiac and Pulmonary Rehab  Date  07/21/18  Educator  [facility]  Instruction Review Code  1- Verbalizes Understanding      Infection Prevention: - Provides verbal and written material to individual with discussion of infection control including proper hand washing and proper equipment cleaning during exercise session.   Pulmonary Rehab from 08/07/2018 in [facility] Cardiac and Pulmonary Rehab  Date  07/21/18  Educator  [facility]  Instruction Review Code  1- Verbalizes Understanding      Falls Prevention: - Provides verbal and written material to individual with discussion of falls prevention and safety.   Pulmonary Rehab from 08/07/2018 in [facility] Cardiac and Pulmonary Rehab  Date  07/21/18  Educator  [facility]  Instruction Review Code  1- Verbalizes Understanding      Diabetes: - Individual verbal and written instruction to review signs/symptoms of diabetes, desired  ranges of glucose level fasting, after meals and with exercise. Advice that pre and post exercise glucose checks will be done for 3 sessions at entry of program.   Chronic Lung Diseases: - Group verbal and written instruction to review updates, respiratory medications, advancements in procedures and treatments. Discuss use of supplemental oxygen including available portable oxygen systems, continuous and intermittent flow rates, concentrators, personal use and safety guidelines. Review proper use of inhaler and spacers. Provide informative websites for self-education.    Pulmonary Rehab from 08/07/2018 in [facility] Cardiac and Pulmonary Rehab  Date  08/07/18  Educator  [facility]  Instruction Review Code  1- Verbalizes Understanding      Energy Conservation: - Provide group verbal and written instruction for  methods to conserve energy, plan and organize activities. Instruct on pacing techniques, use of adaptive equipment and posture/positioning to relieve shortness of breath.   Triggers and Exacerbations: - Group verbal and written instruction to review types of environmental triggers and ways to prevent exacerbations. Discuss weather changes, air quality and the benefits of nasal washing. Review warning signs and symptoms to help prevent infections. Discuss techniques for effective airway clearance, coughing, and vibrations.   AED/CPR: - Group verbal and written instruction with the use of models to demonstrate the basic use of the AED with the basic ABC's of resuscitation.   Anatomy and Physiology of the Lungs: - Group verbal and written instruction with the use of models to provide basic lung anatomy and physiology related to function, structure and complications of lung disease.   Anatomy & Physiology of the Heart: - Group verbal and written instruction and models provide basic cardiac anatomy and physiology, with the coronary electrical and arterial systems. Review of Valvular disease and Heart  Failure   Cardiac Rehab from 02/14/2016 in [facility] Cardiac and Pulmonary Rehab  Date  12/25/15  Educator  DW  Instruction Review Code (retired)  2- meets goals/outcomes      Cardiac Medications: - Group verbal and written instruction to review commonly prescribed medications for heart disease. Reviews the medication, class of the drug, and side effects.   Cardiac Rehab from 02/14/2016 in [facility] Cardiac and Pulmonary Rehab  Date  01/15/16 Marisue Humble 2]  Educator  DW  Instruction Review Code (retired)  2- meets goals/outcomes      Know Your Numbers and Risk Factors: -Group verbal and written instruction about important numbers in your health.  Discussion of what are risk factors and how they play a role in the disease process.  Review of Cholesterol, Blood Pressure, Diabetes, and BMI and the role they play in your overall health.   Sleep Hygiene: -Provides group verbal and written instruction about how sleep can affect your health.  Define sleep hygiene, discuss sleep cycles and impact of sleep habits. Review good sleep hygiene tips.    Other: -Provides group and verbal instruction on various topics (see comments)    Knowledge Questionnaire Score: Knowledge Questionnaire Score - 07/21/18 1450      Knowledge Questionnaire Score   Pre Score  12/18   reviewed with patient       Core Components/Risk Factors/Patient Goals at Admission: Personal Goals and Risk Factors at Admission - 07/21/18 1516      Core Components/Risk Factors/Patient Goals on Admission    Weight Management  Yes;Weight Maintenance    Intervention  Weight Management: Develop a combined nutrition and exercise program designed to reach desired caloric intake, while maintaining appropriate intake of nutrient and fiber, sodium and fats, and appropriate energy expenditure required for the weight goal.;Weight Management: Provide education and appropriate resources to help participant work on and attain dietary goals.    Admit  Weight  144 lb (65.3 kg)    Goal Weight: Short Term  140 lb (63.5 kg)    Goal Weight: Long Term  140 lb (63.5 kg)    Expected Outcomes  Short Term: Continue to assess and modify interventions until short term weight is achieved;Long Term: Adherence to nutrition and physical activity/exercise program aimed toward attainment of established weight goal;Weight Maintenance: Understanding of the daily nutrition guidelines, which includes 25-35% calories from fat, 7% or less cal from saturated fats, less than 237m cholesterol, less than 1.5gm of sodium, & 5 or more  servings of fruits and vegetables daily;Understanding recommendations for meals to include 15-35% energy as protein, 25-35% energy from fat, 35-60% energy from carbohydrates, less than 214m of dietary cholesterol, 20-35 gm of total fiber daily;Understanding of distribution of calorie intake throughout the day with the consumption of 4-5 meals/snacks    Improve shortness of breath with ADL's  Yes    Intervention  Provide education, individualized exercise plan and daily activity instruction to help decrease symptoms of SOB with activities of daily living.    Expected Outcomes  Short Term: Improve cardiorespiratory fitness to achieve a reduction of symptoms when performing ADLs;Long Term: Be able to perform more ADLs without symptoms or delay the onset of symptoms    Heart Failure  Yes    Intervention  Provide a combined exercise and nutrition program that is supplemented with education, support and counseling about heart failure. Directed toward relieving symptoms such as shortness of breath, decreased exercise tolerance, and extremity edema.    Expected Outcomes  Improve functional capacity of life;Short term: Attendance in program 2-3 days a week with increased exercise capacity. Reported lower sodium intake. Reported increased fruit and vegetable intake. Reports medication compliance.;Short term: Daily weights obtained and reported for increase.  Utilizing diuretic protocols set by physician.;Long term: Adoption of self-care skills and reduction of barriers for early signs and symptoms recognition and intervention leading to self-care maintenance.       Core Components/Risk Factors/Patient Goals Review:    Core Components/Risk Factors/Patient Goals at Discharge (Final Review):    ITP Comments: ITP Comments    Row Name 07/21/18 1429 08/10/18 0843         ITP Comments  Medical Evaluation completed. Chart sent for review and changes to Dr. MEmily FilbertDirector of [place] Diagnosis can be found in [facility] encounter 07/09/2018  30 day review completed. ITP sent to Dr. MEmily FilbertDirector of [place] Continue with ITP unless changes are made by physician.         Comments: 30 day review

## 2018-08-10 NOTE — Progress Notes (Signed)
Daily Session Note  Patient Details  Name: [name] MRN: [id number] Date of Birth: 07/09/1934 Referring Provider:     Pulmonary Rehab from 07/21/2018 in [facility] Cardiac and Pulmonary Rehab  Referring Provider  Miller, Mark MD      Encounter Date: 08/10/2018  Check In:      Social History   Tobacco Use  Smoking Status Former Smoker  . Packs/day: 1.00  . Years: 27.00  . Pack years: 27.00  . Types: Cigarettes  . Last attempt to quit: 07/01/1980  . Years since quitting: 38.1  Smokeless Tobacco Never Used  Tobacco Comment   "quit smoking ~ 1980    Goals Met:  Proper associated with RPD/PD & O2 Sat Independence with exercise equipment Exercise tolerated well Strength training completed today  Goals Unmet:  Not Applicable  Comments: Pt able to follow exercise prescription today without complaint.  Will continue to monitor for progression.    Dr. Mark Miller is Medical Director for [facility] and [facility]. 

## 2018-08-12 DIAGNOSIS — J9611 Chronic respiratory failure with hypoxia: Secondary | ICD-10-CM

## 2018-08-12 DIAGNOSIS — Z9981 Dependence on supplemental oxygen: Principal | ICD-10-CM

## 2018-08-12 NOTE — Progress Notes (Signed)
Daily Session Note  Patient Details  Name: [name] MRN: [id number] Date of Birth: 11/06/1934 Referring Provider:     Pulmonary Rehab from 07/21/2018 in [facility] Cardiac and Pulmonary Rehab  Referring Provider  [name] MD      Encounter Date: 08/12/2018  Check In: Session Check In - 08/12/18 1146      Check-In   Supervising physician immediately available to respond to emergencies  [facility] immediately available ER MD    Physician(s)  Dr. Kerman Passey and Dr. Mariea Clonts    Staff Present  Jasper Loser BS, Exercise Physiologist;Jessica Luan Pulling, MA, RCEP, CCRP, Exercise Physiologist;Joseph Tessie Fass RCP,RRT,BSRT    Medication changes reported      No    Fall or balance concerns reported     No    Warm-up and Cool-down  Performed as group-led instruction    Resistance Training Performed  Yes    VAD Patient?  No    PAD/SET Patient?  No      Pain Assessment   Currently in Pain?  No/denies    Multiple Pain Sites  No          Social History   Tobacco Use  Smoking Status Former Smoker  . Packs/day: 1.00  . Years: 27.00  . Pack years: 27.00  . Types: Cigarettes  . Last attempt to quit: 07/01/1980  . Years since quitting: 38.1  Smokeless Tobacco Never Used  Tobacco Comment   "quit smoking ~ 1980    Goals Met:  Proper associated with RPD/PD & O2 Sat Independence with exercise equipment Exercise tolerated well Strength training completed today  Goals Unmet:  Not Applicable  Comments: Pt able to follow exercise prescription today without complaint.  Will continue to monitor for progression.  Reviewed home exercise with pt today.  Pt plans to walk and do PT exercises at home for exercise.  Reviewed THR, pulse, RPE, sign and symptoms, and when to call 911 or MD.  Also discussed weather considerations and indoor options.  Pt voiced understanding.   Dr. [name] is Medical Director for [place] and [facility].

## 2018-08-14 ENCOUNTER — Encounter: Payer: Medicare Other | Admitting: *Deleted

## 2018-08-14 DIAGNOSIS — J9611 Chronic respiratory failure with hypoxia: Secondary | ICD-10-CM

## 2018-08-14 DIAGNOSIS — Z9981 Dependence on supplemental oxygen: Principal | ICD-10-CM

## 2018-08-14 NOTE — Progress Notes (Signed)
Daily Session Note  Patient Details  Name: [name] MRN: [id number] Date of Birth: 1935-05-07 Referring Provider:     Pulmonary Rehab from 07/21/2018 in [facility] Cardiac and Pulmonary Rehab  Referring Provider  [name] MD      Encounter Date: 08/14/2018  Check In: Session Check In - 08/14/18 1221      Check-In   Supervising physician immediately available to respond to emergencies  [facility] immediately available ER MD    Physician(s)  Dr. Cherylann Banas and Clearnce Hasten    Location  [facility]-Cardiac & Pulmonary Rehab    Staff Present  Renita Papa, RN BSN;Jessica Luan Pulling, MA, RCEP, CCRP, Exercise Physiologist;Joseph Tessie Fass RCP,RRT,BSRT    Medication changes reported      No    Fall or balance concerns reported     No    Warm-up and Cool-down  Performed as group-led instruction    Resistance Training Performed  Yes    VAD Patient?  No    PAD/SET Patient?  No      VAD patient   Has back up controller?  No      Pain Assessment   Currently in Pain?  No/denies          Social History   Tobacco Use  Smoking Status Former Smoker  . Packs/day: 1.00  . Years: 27.00  . Pack years: 27.00  . Types: Cigarettes  . Last attempt to quit: 07/01/1980  . Years since quitting: 38.1  Smokeless Tobacco Never Used  Tobacco Comment   "quit smoking ~ 1980    Goals Met:  Proper associated with RPD/PD & O2 Sat Independence with exercise equipment Using PLB without cueing & demonstrates good technique Exercise tolerated well No report of cardiac concerns or symptoms Strength training completed today  Goals Unmet:  Not Applicable  Comments: Pt able to follow exercise prescription today without complaint.  Will continue to monitor for progression.    Dr. [name] is Medical Director for [place] and [facility].

## 2018-08-17 DIAGNOSIS — J9611 Chronic respiratory failure with hypoxia: Secondary | ICD-10-CM

## 2018-08-17 DIAGNOSIS — Z9981 Dependence on supplemental oxygen: Principal | ICD-10-CM

## 2018-08-17 NOTE — Progress Notes (Signed)
Daily Session Note  Patient Details  Name: [name] MRN: [id number] Date of Birth: 1934/08/30 Referring Provider:     Pulmonary Rehab from 07/21/2018 in [facility] Cardiac and Pulmonary Rehab  Referring Provider  [name] MD      Encounter Date: 08/17/2018  Check In:      Social History   Tobacco Use  Smoking Status Former Smoker  . Packs/day: 1.00  . Years: 27.00  . Pack years: 27.00  . Types: Cigarettes  . Last attempt to quit: 07/01/1980  . Years since quitting: 38.1  Smokeless Tobacco Never Used  Tobacco Comment   "quit smoking ~ 1980    Goals Met:  Proper associated with RPD/PD & O2 Sat Independence with exercise equipment Exercise tolerated well  Goals Unmet:  Not Applicable  Comments: Pt able to follow exercise prescription today without complaint.  Will continue to monitor for progression.    Dr. [name] is Medical Director for [place] and [facility].

## 2018-08-19 ENCOUNTER — Encounter: Payer: Medicare Other | Admitting: *Deleted

## 2018-08-19 DIAGNOSIS — Z9981 Dependence on supplemental oxygen: Principal | ICD-10-CM

## 2018-08-19 DIAGNOSIS — J9611 Chronic respiratory failure with hypoxia: Secondary | ICD-10-CM | POA: Diagnosis not present

## 2018-08-19 NOTE — Progress Notes (Signed)
Daily Session Note  Patient Details  Name: [name] MRN: [id number] Date of Birth: 10-21-1934 Referring Provider:     Pulmonary Rehab from 07/21/2018 in [facility] Cardiac and Pulmonary Rehab  Referring Provider  [name] MD      Encounter Date: 08/19/2018  Check In: Session Check In - 08/19/18 1130      Check-In   Supervising physician immediately available to respond to emergencies  [facility] immediately available ER MD    Physician(s)  Dr. Joni Fears and Jimmye Norman    Location  [facility]-Cardiac & Pulmonary Rehab    Staff Present  Renita Papa, RN BSN;Jessica Luan Pulling, MA, RCEP, CCRP, Exercise Physiologist;Joseph Tessie Fass RCP,RRT,BSRT    Medication changes reported      No    Fall or balance concerns reported     No    Warm-up and Cool-down  Performed as group-led instruction    Resistance Training Performed  Yes    VAD Patient?  No    PAD/SET Patient?  No      Pain Assessment   Currently in Pain?  No/denies          Social History   Tobacco Use  Smoking Status Former Smoker  . Packs/day: 1.00  . Years: 27.00  . Pack years: 27.00  . Types: Cigarettes  . Last attempt to quit: 07/01/1980  . Years since quitting: 38.1  Smokeless Tobacco Never Used  Tobacco Comment   "quit smoking ~ 1980    Goals Met:  Proper associated with RPD/PD & O2 Sat Independence with exercise equipment Using PLB without cueing & demonstrates good technique Exercise tolerated well No report of cardiac concerns or symptoms Strength training completed today  Goals Unmet:  Not Applicable  Comments: Pt able to follow exercise prescription today without complaint.  Will continue to monitor for progression.    Dr. [name] is Medical Director for [place] and [facility].

## 2018-08-21 ENCOUNTER — Other Ambulatory Visit: Payer: Self-pay | Admitting: Cardiovascular Disease

## 2018-08-21 NOTE — Telephone Encounter (Signed)
Patient needs an appointment

## 2018-08-24 DIAGNOSIS — J9611 Chronic respiratory failure with hypoxia: Secondary | ICD-10-CM | POA: Diagnosis not present

## 2018-08-24 DIAGNOSIS — Z9981 Dependence on supplemental oxygen: Principal | ICD-10-CM

## 2018-08-24 NOTE — Progress Notes (Signed)
Daily Session Note  Patient Details  Name: [name] MRN: [id number] Date of Birth: May 08, 1935 Referring Provider:     Pulmonary Rehab from 07/21/2018 in [facility] Cardiac and Pulmonary Rehab  Referring Provider  [name] MD      Encounter Date: 08/24/2018  Check In:      Social History   Tobacco Use  Smoking Status Former Smoker  . Packs/day: 1.00  . Years: 27.00  . Pack years: 27.00  . Types: Cigarettes  . Last attempt to quit: 07/01/1980  . Years since quitting: 38.1  Smokeless Tobacco Never Used  Tobacco Comment   "quit smoking ~ 1980    Goals Met:  Proper associated with RPD/PD & O2 Sat Independence with exercise equipment Exercise tolerated well Strength training completed today  Goals Unmet:  Not Applicable  Comments: Pt able to follow exercise prescription today without complaint.  Will continue to monitor for progression.    Dr. [name] is Medical Director for [place] and [facility].

## 2018-08-25 ENCOUNTER — Telehealth: Payer: Self-pay | Admitting: Cardiovascular Disease

## 2018-08-25 NOTE — Telephone Encounter (Signed)
Called and spoke with patient Patient does wish to follow up with Dr Rockey Situ  Patient will be coming in on 3/2 for coumadin and will bring a calender to schedule at that time

## 2018-08-25 NOTE — Telephone Encounter (Signed)
-----   Message from Janan Ridge, [place] sent at 08/21/2018  2:54 PM EST ----- Regarding: appointment for refills Can you please contact patient and try to make an appointment.  It looks like she has been seeing [facility].  Unsure if she will be following up here or not.  Looks like patient last seen [facility] 09/29/2017 and was told to follow up in 3 months.   Thanks ladies!

## 2018-08-26 ENCOUNTER — Other Ambulatory Visit: Payer: Self-pay | Admitting: Cardiovascular Disease

## 2018-08-26 ENCOUNTER — Encounter: Payer: Medicare Other | Admitting: *Deleted

## 2018-08-26 DIAGNOSIS — Z9981 Dependence on supplemental oxygen: Principal | ICD-10-CM

## 2018-08-26 DIAGNOSIS — J9611 Chronic respiratory failure with hypoxia: Secondary | ICD-10-CM | POA: Diagnosis not present

## 2018-08-26 DIAGNOSIS — I483 Typical atrial flutter: Secondary | ICD-10-CM

## 2018-08-26 NOTE — Progress Notes (Signed)
Daily Session Note  Patient Details  Name: [name] MRN: [id number] Date of Birth: 11-23-1934 Referring Provider:     Pulmonary Rehab from 07/21/2018 in [facility] Cardiac and Pulmonary Rehab  Referring Provider  [name] MD      Encounter Date: 08/26/2018  Check In: Session Check In - 08/26/18 1116      Check-In   Supervising physician immediately available to respond to emergencies  [facility] immediately available ER MD    Physician(s)  Drs. Guadalupe Dawn    Location  [facility]-Cardiac & Pulmonary Rehab    Staff Present  Renita Papa, RN BSN;Kemesha Mosey Luan Pulling, MA, RCEP, CCRP, Exercise Physiologist;Joseph Tessie Fass RCP,RRT,BSRT    Medication changes reported      No    Fall or balance concerns reported     No    Warm-up and Cool-down  Performed as group-led instruction    Resistance Training Performed  Yes    VAD Patient?  No    PAD/SET Patient?  No      Pain Assessment   Currently in Pain?  No/denies          Social History   Tobacco Use  Smoking Status Former Smoker  . Packs/day: 1.00  . Years: 27.00  . Pack years: 27.00  . Types: Cigarettes  . Last attempt to quit: 07/01/1980  . Years since quitting: 38.1  Smokeless Tobacco Never Used  Tobacco Comment   "quit smoking ~ 1980    Goals Met:  Proper associated with RPD/PD & O2 Sat Independence with exercise equipment Using PLB without cueing & demonstrates good technique Exercise tolerated well No report of cardiac concerns or symptoms Strength training completed today  Goals Unmet:  Not Applicable  Comments: Pt able to follow exercise prescription today without complaint.  Will continue to monitor for progression.    Dr. [name] is Medical Director for [place] and [facility].

## 2018-08-28 DIAGNOSIS — Z9981 Dependence on supplemental oxygen: Principal | ICD-10-CM

## 2018-08-28 DIAGNOSIS — J9611 Chronic respiratory failure with hypoxia: Secondary | ICD-10-CM | POA: Diagnosis not present

## 2018-08-28 NOTE — Progress Notes (Signed)
Daily Session Note  Patient Details  Name: [name] MRN: [id number] Date of Birth: [birth year] Referring Provider:     Pulmonary Rehab from 07/21/2018 in [facility] Cardiac and Pulmonary Rehab  Referring Provider  [name] MD      Encounter Date: 08/28/2018  Check In: Session Check In - 08/28/18 1143      Check-In   Supervising physician immediately available to respond to emergencies  [facility] immediately available ER MD    Physician(s)  Drs. Cinda Quest and Kinsey.Precise    Location  [facility]-Cardiac & Pulmonary Rehab    Staff Present  Justin Mend Lorre Nick, [place], RCEP, CCRP, Exercise Physiologist;Meredith Sherryll Burger, RN BSN    Medication changes reported      No    Fall or balance concerns reported     No    Warm-up and Cool-down  Performed as group-led instruction    Resistance Training Performed  Yes    VAD Patient?  No      Pain Assessment   Currently in Pain?  No/denies          Social History   Tobacco Use  Smoking Status Former Smoker  . Packs/day: 1.00  . Years: 27.00  . Pack years: 27.00  . Types: Cigarettes  . Last attempt to quit: 07/01/1980  . Years since quitting: 38.1  Smokeless Tobacco Never Used  Tobacco Comment   "quit smoking ~ 1980    Goals Met:  Independence with exercise equipment Exercise tolerated well No report of cardiac concerns or symptoms Strength training completed today  Goals Unmet:  Not Applicable  Comments: Pt able to follow exercise prescription today without complaint.  Will continue to monitor for progression.    Dr. [name] is Medical Director for [place] and [facility].

## 2018-08-31 ENCOUNTER — Encounter: Payer: Medicare Other | Attending: Internal Medicine

## 2018-08-31 ENCOUNTER — Ambulatory Visit (INDEPENDENT_AMBULATORY_CARE_PROVIDER_SITE_OTHER): Payer: Medicare Other

## 2018-08-31 DIAGNOSIS — J9611 Chronic respiratory failure with hypoxia: Secondary | ICD-10-CM | POA: Diagnosis present

## 2018-08-31 DIAGNOSIS — I130 Hypertensive heart and chronic kidney disease with heart failure and stage 1 through stage 4 chronic kidney disease, or unspecified chronic kidney disease: Secondary | ICD-10-CM | POA: Insufficient documentation

## 2018-08-31 DIAGNOSIS — I5042 Chronic combined systolic (congestive) and diastolic (congestive) heart failure: Secondary | ICD-10-CM | POA: Insufficient documentation

## 2018-08-31 DIAGNOSIS — Z5181 Encounter for therapeutic drug level monitoring: Secondary | ICD-10-CM | POA: Diagnosis not present

## 2018-08-31 DIAGNOSIS — I480 Paroxysmal atrial fibrillation: Secondary | ICD-10-CM

## 2018-08-31 DIAGNOSIS — Z87891 Personal history of nicotine dependence: Secondary | ICD-10-CM | POA: Diagnosis not present

## 2018-08-31 DIAGNOSIS — Z952 Presence of prosthetic heart valve: Secondary | ICD-10-CM

## 2018-08-31 DIAGNOSIS — Z9981 Dependence on supplemental oxygen: Secondary | ICD-10-CM | POA: Insufficient documentation

## 2018-08-31 DIAGNOSIS — Z7901 Long term (current) use of anticoagulants: Secondary | ICD-10-CM | POA: Diagnosis not present

## 2018-08-31 DIAGNOSIS — N189 Chronic kidney disease, unspecified: Secondary | ICD-10-CM | POA: Insufficient documentation

## 2018-08-31 DIAGNOSIS — I482 Chronic atrial fibrillation, unspecified: Secondary | ICD-10-CM | POA: Insufficient documentation

## 2018-08-31 DIAGNOSIS — K219 Gastro-esophageal reflux disease without esophagitis: Secondary | ICD-10-CM | POA: Diagnosis not present

## 2018-08-31 LAB — POCT INR: INR: 2.2 (ref 2.0–3.0)

## 2018-08-31 NOTE — Patient Instructions (Signed)
Please take 1 tablet today and tomorrow, then continue dosage of 3mg  (1/2 tablet) daily, EXCEPT WHOLE PILL ON [place]. Recheck in 4 weeks.

## 2018-08-31 NOTE — Progress Notes (Signed)
Daily Session Note  Patient Details  Name: [name] MRN: [id number] Date of Birth: Mar 02, 1935 Referring Provider:     Pulmonary Rehab from 07/21/2018 in [facility] Cardiac and Pulmonary Rehab  Referring Provider  [name] MD      Encounter Date: 08/31/2018  Check In:      Social History   Tobacco Use  Smoking Status Former Smoker  . Packs/day: 1.00  . Years: 27.00  . Pack years: 27.00  . Types: Cigarettes  . Last attempt to quit: 07/01/1980  . Years since quitting: 38.1  Smokeless Tobacco Never Used  Tobacco Comment   "quit smoking ~ 1980    Goals Met:  Proper associated with RPD/PD & O2 Sat Independence with exercise equipment Exercise tolerated well Strength training completed today  Goals Unmet:  Not Applicable  Comments: Pt able to follow exercise prescription today without complaint.  Will continue to monitor for progression.    Dr. [name] is Medical Director for [place] and [facility].

## 2018-09-02 ENCOUNTER — Encounter: Payer: Medicare Other | Admitting: *Deleted

## 2018-09-02 DIAGNOSIS — J9611 Chronic respiratory failure with hypoxia: Secondary | ICD-10-CM | POA: Diagnosis not present

## 2018-09-02 DIAGNOSIS — Z9981 Dependence on supplemental oxygen: Principal | ICD-10-CM

## 2018-09-02 NOTE — Progress Notes (Signed)
Daily Session Note  Patient Details  Name: [name] MRN: [id number] Date of Birth: 12-10-1934 Referring Provider:     Pulmonary Rehab from 07/21/2018 in [facility] Cardiac and Pulmonary Rehab  Referring Provider  [name] MD      Encounter Date: 09/02/2018  Check In: Session Check In - 09/02/18 1131      Check-In   Supervising physician immediately available to respond to emergencies  [facility] immediately available ER MD    Physician(s)  Dr. Corky Downs and Joni Fears    Location  [facility]-Cardiac & Pulmonary Rehab    Staff Present  Renita Papa, RN BSN;Jessica Luan Pulling, MA, RCEP, CCRP, Exercise Physiologist;Joseph Tessie Fass RCP,RRT,BSRT    Medication changes reported      No    Fall or balance concerns reported     No    Warm-up and Cool-down  Performed as group-led instruction    Resistance Training Performed  Yes    VAD Patient?  No    PAD/SET Patient?  No      Pain Assessment   Currently in Pain?  No/denies          Social History   Tobacco Use  Smoking Status Former Smoker  . Packs/day: 1.00  . Years: 27.00  . Pack years: 27.00  . Types: Cigarettes  . Last attempt to quit: 07/01/1980  . Years since quitting: 38.1  Smokeless Tobacco Never Used  Tobacco Comment   "quit smoking ~ 1980    Goals Met:  Proper associated with RPD/PD & O2 Sat Independence with exercise equipment Using PLB without cueing & demonstrates good technique Exercise tolerated well No report of cardiac concerns or symptoms Strength training completed today  Goals Unmet:  Not Applicable  Comments: Pt able to follow exercise prescription today without complaint.  Will continue to monitor for progression.    Dr. [name] is Medical Director for [place] and [facility].

## 2018-09-04 ENCOUNTER — Encounter: Payer: Medicare Other | Admitting: *Deleted

## 2018-09-04 DIAGNOSIS — J9611 Chronic respiratory failure with hypoxia: Secondary | ICD-10-CM

## 2018-09-04 DIAGNOSIS — Z9981 Dependence on supplemental oxygen: Principal | ICD-10-CM

## 2018-09-04 NOTE — Progress Notes (Signed)
Daily Session Note  Patient Details  Name: [name] MRN: [id number] Date of Birth: [name] 10, 1935 Referring Provider:     Pulmonary Rehab from 07/21/2018 in [facility] Cardiac and Pulmonary Rehab  Referring Provider  [name] MD      Encounter Date: 09/04/2018  Check In: Session Check In - 09/04/18 1136      Check-In   Supervising physician immediately available to respond to emergencies  [facility] immediately available ER MD    Physician(s)  Dr. Cherylann Banas and Corky Downs    Location  [facility]-Cardiac & Pulmonary Rehab    Staff Present  Renita Papa, RN BSN;Joseph Darrin Nipper, [place], RCEP, CCRP, Exercise Physiologist    Medication changes reported      No    Fall or balance concerns reported     No    Warm-up and Cool-down  Performed as group-led instruction    Resistance Training Performed  Yes    VAD Patient?  No    PAD/SET Patient?  No      Pain Assessment   Currently in Pain?  No/denies          Social History   Tobacco Use  Smoking Status Former Smoker  . Packs/day: 1.00  . Years: 27.00  . Pack years: 27.00  . Types: Cigarettes  . Last attempt to quit: 07/01/1980  . Years since quitting: 38.2  Smokeless Tobacco Never Used  Tobacco Comment   "quit smoking ~ 1980    Goals Met:  Proper associated with RPD/PD & O2 Sat Independence with exercise equipment Using PLB without cueing & demonstrates good technique Exercise tolerated well No report of cardiac concerns or symptoms Strength training completed today  Goals Unmet:  Not Applicable  Comments: Pt able to follow exercise prescription today without complaint.  Will continue to monitor for progression.    Dr. [name] is Medical Director for [place] and [facility].

## 2018-09-07 DIAGNOSIS — Z9981 Dependence on supplemental oxygen: Principal | ICD-10-CM

## 2018-09-07 DIAGNOSIS — J9611 Chronic respiratory failure with hypoxia: Secondary | ICD-10-CM

## 2018-09-07 NOTE — Telephone Encounter (Signed)
Scheduled 4/14

## 2018-09-07 NOTE — Progress Notes (Signed)
Pulmonary Individual Treatment Plan  Patient Details  Name: [name] MRN: [id number] Date of Birth: Feb 12, 1935 Referring Provider:     Pulmonary Rehab from 07/21/2018 in [facility] Cardiac and Pulmonary Rehab  Referring Provider  [name] MD      Initial Encounter Date:    Pulmonary Rehab from 07/21/2018 in [facility] Cardiac and Pulmonary Rehab  Date  07/21/18      Visit Diagnosis: Chronic respiratory failure with hypoxia, on home O2 therapy ([facility])  Patient's Home Medications on Admission:  Current Outpatient Medications:  .  acetaminophen (TYLENOL) 650 MG CR tablet, Take 650 mg by mouth every 8 (eight) hours as needed for pain., Disp: , Rfl:  .  amiodarone (PACERONE) 200 MG tablet, TAKE 1 TABLET DAILY, Disp: 90 tablet, Rfl: 0 .  aspirin EC 81 MG tablet, Take 81 mg by mouth daily., Disp: , Rfl:  .  Cholecalciferol (VITAMIN D) 2000 units tablet, Take 2,000 Units by mouth daily., Disp: , Rfl:  .  furosemide (LASIX) 20 MG tablet, Take 1 tablet (20 mg total) by mouth 3 (three) times daily., Disp: 270 tablet, Rfl: 3 .  KLOR-CON 10 10 MEQ tablet, Take 10 mEq by mouth daily. , Disp: , Rfl:  .  losartan (COZAAR) 25 MG tablet, TAKE 1/2 TABLET EVERY DAY, Disp: 30 tablet, Rfl: 3 .  Menthol (ICY HOT) 5 % PTCH, Apply 1 patch topically daily as needed (pain)., Disp: , Rfl:  .  metolazone (ZAROXOLYN) 5 MG tablet, Take 1 tablet (5 mg total) by mouth as needed (As needed for weight greater than 148)., Disp: 90 tablet, Rfl: 3 .  Multiple Vitamin (MULTIVITAMIN WITH MINERALS) TABS tablet, Take 1 tablet by mouth daily., Disp: , Rfl:  .  Probiotic Product (RISA-BID PROBIOTIC PO), Take 1 capsule by mouth daily. , Disp: , Rfl:  .  Simethicone (GAS-X PO), Take by mouth., Disp: , Rfl:  .  spironolactone (ALDACTONE) 25 MG tablet, Take 25 mg by mouth daily. , Disp: , Rfl:  .  vitamin C (ASCORBIC ACID) 500 MG tablet, Take 500 mg by mouth daily., Disp: , Rfl:  .  warfarin (COUMADIN) 6 MG tablet, Take 3-6 mg by  mouth See admin instructions. Take 3 mg once daily at night except for Wednesdays take 6 mg once nightly, Disp: , Rfl:   Past Medical History: Past Medical History:  Diagnosis Date  . Arthritis    "back, hands, knees" (04/10/2016)  . Chronic atrial fibrillation    a. On Coumadin  . Chronic back pain   . 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 EF 25% by echo in 08/2015 b. RHC in 08/2015 showed normal filling pressures  . Chronic kidney disease   . Compression fracture    "several; all in my back" (04/10/2016)  . GERD (gastroesophageal reflux disease)   . Hypertension   . Lymphedema   . S/P MVR (mitral valve replacement)    a. MVR 1994 b. redo MVR in 08/2014 - on Coumadin  . Shortness of breath dyspnea     Tobacco Use: Social History   Tobacco Use  Smoking Status Former Smoker  . Packs/day: 1.00  . Years: 27.00  . Pack years: 27.00  . Types: Cigarettes  . Last attempt to quit: 07/01/1980  . Years since quitting: 38.2  Smokeless Tobacco Never Used  Tobacco Comment   "quit smoking ~ 1980    Labs: Recent Review Flowsheet Data    There  is no flowsheet data to display.       Pulmonary Assessment Scores: Pulmonary Assessment Scores    Row Name 07/21/18 1451         ADL UCSD   ADL Phase  Entry     SOB Score total  100     Rest  3     Walk  3     Stairs  4     Bath  5     Dress  5     Shop  5       CAT Score   CAT Score  24       mMRC Score   mMRC Score  4        Pulmonary Function Assessment: Pulmonary Function Assessment - 07/21/18 1511      Breath   Bilateral Breath Sounds  Clear;Decreased    Shortness of Breath  Yes;Limiting activity       Exercise Target Goals: Exercise Program Goal: Individual exercise prescription set using results from initial 6 min walk test and THRR while considering  patient's activity barriers and safety.   Exercise Prescription Goal: Initial exercise prescription  builds to 30-45 minutes a day of aerobic activity, 2-3 days per week.  Home exercise guidelines will be given to patient during program as part of exercise prescription that the participant will acknowledge.  Activity Barriers & Risk Stratification: Activity Barriers & Cardiac Risk Stratification - 07/21/18 1520      Activity Barriers & Cardiac Risk Stratification   Activity Barriers  Arthritis;Back Problems;Deconditioning;Muscular Weakness;Shortness of Breath;Balance Concerns;Assistive Device   hx of multiple back fractures      6 Minute Walk: 6 Minute Walk    Row Name 07/21/18 1517         6 Minute Walk   Phase  Initial     Distance  580 feet     Walk Time  6 minutes     # of Rest Breaks  0     MPH  1.09     METS  1.01     RPE  11     Perceived Dyspnea   3     VO2 Peak  3.54     Symptoms  Yes (comment)     Comments  chronic back pain     Resting HR  72 bpm     Resting BP  126/62     Resting Oxygen Saturation   94 %     Exercise Oxygen Saturation  during 6 min walk  80 %     Max Ex. HR  83 bpm     Max Ex. BP  164/74     2 Minute Post BP  136/64       Interval HR   1 Minute HR  72     2 Minute HR  76     3 Minute HR  79     4 Minute HR  69     5 Minute HR  71     6 Minute HR  83     2 Minute Post HR  73     Interval Heart Rate?  Yes       Interval Oxygen   Interval Oxygen?  Yes     Baseline Oxygen Saturation %  94 %     1 Minute Oxygen Saturation %  88 %     1 Minute Liters of Oxygen  2 L continuous  2 Minute Oxygen Saturation %  83 %     2 Minute Liters of Oxygen  2 L     3 Minute Oxygen Saturation %  82 %     3 Minute Liters of Oxygen  2 L     4 Minute Oxygen Saturation %  82 %     4 Minute Liters of Oxygen  2 L     5 Minute Oxygen Saturation %  80 %     5 Minute Liters of Oxygen  2 L     6 Minute Oxygen Saturation %  87 %     6 Minute Liters of Oxygen  2 L     2 Minute Post Oxygen Saturation %  98 %     2 Minute Post Liters of Oxygen  2 L        Oxygen Initial Assessment: Oxygen Initial Assessment - 07/21/18 1510      Home Oxygen   Home Oxygen Device  Home Concentrator;E-Tanks    Sleep Oxygen Prescription  Continuous    Liters per minute  2    Home Exercise Oxygen Prescription  Continuous    Liters per minute  2    Home at Rest Exercise Oxygen Prescription  Continuous    Liters per minute  2    Compliance with Home Oxygen Use  Yes      Initial 6 min Walk   Oxygen Used  Continuous;E-Tanks    Liters per minute  2      Program Oxygen Prescription   Program Oxygen Prescription  Continuous    Liters per minute  3      Intervention   Short Term Goals  To learn and exhibit compliance with exercise, home and travel O2 prescription;To learn and understand importance of monitoring SPO2 with pulse oximeter and demonstrate accurate use of the pulse oximeter.;To learn and understand importance of maintaining oxygen saturations>88%;To learn and demonstrate proper pursed lip breathing techniques or other breathing techniques.    Long  Term Goals  Verbalizes importance of monitoring SPO2 with pulse oximeter and return demonstration;Exhibits compliance with exercise, home and travel O2 prescription;Maintenance of O2 saturations>88%;Exhibits proper breathing techniques, such as pursed lip breathing or other method taught during program session;Compliance with respiratory medication       Oxygen Re-Evaluation: Oxygen Re-Evaluation    Row Name 07/27/18 1345 08/28/18 1225           Program Oxygen Prescription   Program Oxygen Prescription  Continuous  Continuous      Liters per minute  3  3        Home Oxygen   Home Oxygen Device  Home Concentrator;E-Tanks  Home Concentrator;E-Tanks      Sleep Oxygen Prescription  Continuous  Continuous      Liters per minute  2  2      Home Exercise Oxygen Prescription  Continuous  Continuous      Liters per minute  2  2      Home at Rest Exercise Oxygen Prescription  Continuous  Continuous       Liters per minute  2  2      Compliance with Home Oxygen Use  Yes  Yes        Goals/Expected Outcomes   Short Term Goals  To learn and exhibit compliance with exercise, home and travel O2 prescription;To learn and understand importance of monitoring SPO2 with pulse oximeter and demonstrate accurate use of the pulse oximeter.;To  learn and understand importance of maintaining oxygen saturations>88%;To learn and demonstrate proper pursed lip breathing techniques or other breathing techniques.  To learn and exhibit compliance with exercise, home and travel O2 prescription;To learn and understand importance of monitoring SPO2 with pulse oximeter and demonstrate accurate use of the pulse oximeter.;To learn and understand importance of maintaining oxygen saturations>88%;To learn and demonstrate proper pursed lip breathing techniques or other breathing techniques.      Long  Term Goals  Verbalizes importance of monitoring SPO2 with pulse oximeter and return demonstration;Exhibits compliance with exercise, home and travel O2 prescription;Maintenance of O2 saturations>88%;Exhibits proper breathing techniques, such as pursed lip breathing or other method taught during program session;Compliance with respiratory medication  Verbalizes importance of monitoring SPO2 with pulse oximeter and return demonstration;Exhibits compliance with exercise, home and travel O2 prescription;Maintenance of O2 saturations>88%;Exhibits proper breathing techniques, such as pursed lip breathing or other method taught during program session;Compliance with respiratory medication      Comments  Reviewed PLB technique with pt.  Talked about how it work and it's important to maintaining his exercise saturations.   Reviewed PLB technique with pt.  Talked about how it work and it's important to maintaining his exercise saturations.       Goals/Expected Outcomes  Short: Become more profiecient at using PLB.   Long: Become independent at using  PLB.  Short: Meila will focus more on standing up right, practicing PLB, and be mindful of any change in her cough. Long: Dayle will be independent in her PLB         Oxygen Discharge (Final Oxygen Re-Evaluation): Oxygen Re-Evaluation - 08/28/18 1225      Program Oxygen Prescription   Program Oxygen Prescription  Continuous    Liters per minute  3      Home Oxygen   Home Oxygen Device  Home Concentrator;E-Tanks    Sleep Oxygen Prescription  Continuous    Liters per minute  2    Home Exercise Oxygen Prescription  Continuous    Liters per minute  2    Home at Rest Exercise Oxygen Prescription  Continuous    Liters per minute  2    Compliance with Home Oxygen Use  Yes      Goals/Expected Outcomes   Short Term Goals  To learn and exhibit compliance with exercise, home and travel O2 prescription;To learn and understand importance of monitoring SPO2 with pulse oximeter and demonstrate accurate use of the pulse oximeter.;To learn and understand importance of maintaining oxygen saturations>88%;To learn and demonstrate proper pursed lip breathing techniques or other breathing techniques.    Long  Term Goals  Verbalizes importance of monitoring SPO2 with pulse oximeter and return demonstration;Exhibits compliance with exercise, home and travel O2 prescription;Maintenance of O2 saturations>88%;Exhibits proper breathing techniques, such as pursed lip breathing or other method taught during program session;Compliance with respiratory medication    Comments  Reviewed PLB technique with pt.  Talked about how it work and it's important to maintaining his exercise saturations.     Goals/Expected Outcomes  Short: Nillie will focus more on standing up right, practicing PLB, and be mindful of any change in her cough. Long: Kabria will be independent in her PLB       Initial Exercise Prescription: Initial Exercise Prescription - 07/21/18 1500      Date of Initial Exercise RX and Referring  Provider   Date  07/21/18    Referring Provider  [name] MD      Oxygen  Oxygen  Continuous    Liters  2      T5 Nustep   Level  1    SPM  80    Minutes  15    METs  1.5      Biostep-RELP   Level  1    SPM  50    Minutes  15    METs  1      Track   Laps  11    Minutes  15    METs  1.5      Prescription Details   Frequency (times per week)  3    Duration  Progress to 45 minutes of aerobic exercise without signs/symptoms of physical distress      Intensity   THRR 40-80% of Max Heartrate  99-126    Ratings of Perceived Exertion  11-13    Perceived Dyspnea  0-4      Progression   Progression  Continue to progress workloads to maintain intensity without signs/symptoms of physical distress.      Resistance Training   Training Prescription  Yes    Weight  3 lbs    Reps  10-15       Perform Capillary Blood Glucose checks as needed.  Exercise Prescription Changes: Exercise Prescription Changes    Row Name 07/21/18 1500 07/28/18 1400 08/11/18 1300 08/12/18 1200 08/25/18 1400     Response to Exercise   Blood Pressure (Admit)  126/62  112/60  118/64  -  124/66   Blood Pressure (Exercise)  164/74  112/60  -  -  -   Blood Pressure (Exit)  136/64  120/62  120/80  -  112/60   Heart Rate (Admit)  72 bpm  73 bpm  68 bpm  -  62 bpm   Heart Rate (Exercise)  83 bpm  80 bpm  74 bpm  -  69 bpm   Heart Rate (Exit)  73 bpm  61 bpm  60 bpm  -  66 bpm   Oxygen Saturation (Admit)  94 %  95 %  85 %  -  98 %   Oxygen Saturation (Exercise)  80 %  89 %  92 %  -  87 %   Oxygen Saturation (Exit)  98 %  99 %  98 %  -  94 %   Rating of Perceived Exertion (Exercise)  11  15  15   -  15   Perceived Dyspnea (Exercise)  3  2  3   -  2   Symptoms  chronic back pain and SOB  SOB  SOB  -  none   Comments  walk test results  first full day of exercise  -  -  -   Duration  -  Progress to 45 minutes of aerobic exercise without signs/symptoms of physical distress  Continue with 45 min of aerobic  exercise without signs/symptoms of physical distress.  -  Continue with 45 min of aerobic exercise without signs/symptoms of physical distress.   Intensity  -  THRR unchanged  THRR unchanged  -  THRR unchanged     Progression   Progression  -  Continue to progress workloads to maintain intensity without signs/symptoms of physical distress.  Continue to progress workloads to maintain intensity without signs/symptoms of physical distress.  -  Continue to progress workloads to maintain intensity without signs/symptoms of physical distress.   Average METs  -  1.67  1.77  -  1.95  Resistance Training   Training Prescription  -  Yes  Yes  -  Yes   Weight  -  3 lbs  ROM  -  ROM   Reps  -  10-15  10-15  -  10-15     Interval Training   Interval Training  -  No  No  -  No     Oxygen   Oxygen  -  Continuous  Continuous  -  Continuous   Liters  -  3  3  -  3     T5 Nustep   Level  -  1  1  -  1   Minutes  -  15  15  -  15   METs  -  1.5  1.7  -  2     Biostep-RELP   Level  -  1  1  -  2   Minutes  -  15  15  -  15   METs  -  2  2  -  2     Track   Laps  -  11  13  -  18   Minutes  -  15  15  -  15   METs  -  1.5  1.6  -  1.84     Home Exercise Plan   Plans to continue exercise at  -  -  -  Home (comment) walking, PT exercises  Home (comment) walking, PT exercises   Frequency  -  -  -  Add 2 additional days to program exercise sessions.  Add 2 additional days to program exercise sessions.   Initial Home Exercises Provided  -  -  -  08/12/18  08/12/18      Exercise Comments: Exercise Comments    Row Name 07/27/18 1251           Exercise Comments  First full day of exercise!  Patient was oriented to gym and equipment including functions, settings, policies, and procedures.  Patient's individual exercise prescription and treatment plan were reviewed.  All starting workloads were established based on the results of the 6 minute walk test done at initial orientation visit.  The plan  for exercise progression was also introduced and progression will be customized based on patient's performance and goals.          Exercise Goals and Review: Exercise Goals    Row Name 07/21/18 1524             Exercise Goals   Increase Physical Activity  Yes       Intervention  Provide advice, education, support and counseling about physical activity/exercise needs.;Develop an individualized exercise prescription for aerobic and resistive training based on initial evaluation findings, risk stratification, comorbidities and participant's personal goals.       Expected Outcomes  Short Term: Attend rehab on a regular basis to increase amount of physical activity.;Long Term: Add in home exercise to make exercise part of routine and to increase amount of physical activity.;Long Term: Exercising regularly at least 3-5 days a week.       Increase Strength and Stamina  Yes       Intervention  Develop an individualized exercise prescription for aerobic and resistive training based on initial evaluation findings, risk stratification, comorbidities and participant's personal goals.;Provide advice, education, support and counseling about physical activity/exercise needs.       Expected Outcomes  Short Term: Increase workloads from initial  exercise prescription for resistance, speed, and METs.;Long Term: Improve cardiorespiratory fitness, muscular endurance and strength as measured by increased METs and functional capacity (6MWT);Short Term: Perform resistance training exercises routinely during rehab and add in resistance training at home       Able to understand and use rate of perceived exertion (RPE) scale  Yes       Intervention  Provide education and explanation on how to use RPE scale       Expected Outcomes  Short Term: Able to use RPE daily in rehab to express subjective intensity level;Long Term:  Able to use RPE to guide intensity level when exercising independently       Able to understand and  use Dyspnea scale  Yes       Intervention  Provide education and explanation on how to use Dyspnea scale       Expected Outcomes  Short Term: Able to use Dyspnea scale daily in rehab to express subjective sense of shortness of breath during exertion;Long Term: Able to use Dyspnea scale to guide intensity level when exercising independently       Knowledge and understanding of Target Heart Rate Range (THRR)  Yes       Intervention  Provide education and explanation of THRR including how the numbers were predicted and where they are located for reference       Expected Outcomes  Short Term: Able to state/look up THRR;Short Term: Able to use daily as guideline for intensity in rehab;Long Term: Able to use THRR to govern intensity when exercising independently       Able to check pulse independently  Yes       Intervention  Provide education and demonstration on how to check pulse in carotid and radial arteries.;Review the importance of being able to check your own pulse for safety during independent exercise       Expected Outcomes  Short Term: Able to explain why pulse checking is important during independent exercise;Long Term: Able to check pulse independently and accurately       Understanding of Exercise Prescription  Yes       Intervention  Provide education, explanation, and written materials on patient's individual exercise prescription       Expected Outcomes  Short Term: Able to explain program exercise prescription;Long Term: Able to explain home exercise prescription to exercise independently          Exercise Goals Re-Evaluation : Exercise Goals Re-Evaluation    Row Name 07/27/18 1251 08/11/18 1257 08/12/18 1230 08/25/18 1455       Exercise Goal Re-Evaluation   Exercise Goals Review  Increase Physical Activity;Able to understand and use rate of perceived exertion (RPE) scale;Increase Strength and Stamina;Able to understand and use Dyspnea scale  Increase Physical Activity;Increase  Strength and Stamina;Understanding of Exercise Prescription  Increase Physical Activity;Able to understand and use rate of perceived exertion (RPE) scale;Knowledge and understanding of Target Heart Rate Range (THRR);Understanding of Exercise Prescription;Increase Strength and Stamina;Able to understand and use Dyspnea scale;Able to check pulse independently  Increase Physical Activity;Increase Strength and Stamina;Understanding of Exercise Prescription    Comments  Reviewed RPE scale, THR and program prescription with pt today.  Pt voiced understanding and was given a copy of goals to take home  Sharalyn is off to a good start in rehab.  She is already up to 13 laps on the track.  She is not using weight due to her neck and spine, as they aggravated more so.  We will be reviewing home exercise guidelines soon.  We will continue to monitor her progress.   Reviewed home exercise with pt today.  Pt plans to walk and do PT exercises at home for exercise.  Reviewed THR, pulse, RPE, sign and symptoms, and when to call 911 or MD.  Also discussed weather considerations and indoor options.  Pt voiced understanding.  Tasmine has been doing well in rehab.  She is up to level 2 on BioStep.  She is ready to increase her T5 NuStep.  We will continue to monitor her progess.     Expected Outcomes  Short: Use RPE daily to regulate intensity. Long: Follow program prescription in THR.  Short: Review home exercise guidelines.  Long: Continue to follow program prescription.   Short: Start to add in walking at home.  Long: Continue to increase activity levels  Short: Increase T5 NuStep.  Long: Continue to increase strength and stamina.       Discharge Exercise Prescription (Final Exercise Prescription Changes): Exercise Prescription Changes - 08/25/18 1400      Response to Exercise   Blood Pressure (Admit)  124/66    Blood Pressure (Exit)  112/60    Heart Rate (Admit)  62 bpm    Heart Rate (Exercise)  69 bpm    Heart Rate  (Exit)  66 bpm    Oxygen Saturation (Admit)  98 %    Oxygen Saturation (Exercise)  87 %    Oxygen Saturation (Exit)  94 %    Rating of Perceived Exertion (Exercise)  15    Perceived Dyspnea (Exercise)  2    Symptoms  none    Duration  Continue with 45 min of aerobic exercise without signs/symptoms of physical distress.    Intensity  THRR unchanged      Progression   Progression  Continue to progress workloads to maintain intensity without signs/symptoms of physical distress.    Average METs  1.95      Resistance Training   Training Prescription  Yes    Weight  ROM    Reps  10-15      Interval Training   Interval Training  No      Oxygen   Oxygen  Continuous    Liters  3      T5 Nustep   Level  1    Minutes  15    METs  2      Biostep-RELP   Level  2    Minutes  15    METs  2      Track   Laps  18    Minutes  15    METs  1.84      Home Exercise Plan   Plans to continue exercise at  Home (comment)   walking, PT exercises   Frequency  Add 2 additional days to program exercise sessions.    Initial Home Exercises Provided  08/12/18       Nutrition:  Target Goals: Understanding of nutrition guidelines, daily intake of sodium <1574m, cholesterol <2043m calories 30% from fat and 7% or less from saturated fats, daily to have 5 or more servings of fruits and vegetables.  Biometrics: Pre Biometrics - 07/21/18 1525      Pre Biometrics   Height  5' 1.6" (1.565 m)    Weight  144 lb 4.8 oz (65.5 kg)    Waist Circumference  36 inches    Hip Circumference  38 inches    Waist  to Hip Ratio  0.95 %    BMI (Calculated)  26.72    Single Leg Stand  1.88 seconds        Nutrition Therapy Plan and Nutrition Goals: Nutrition Therapy & Goals - 07/21/18 1513      Personal Nutrition Goals   Nutrition Goal  She does not feel like meeting with the dietician.    Comments  She wants to be healthier      Intervention Plan   Intervention  Prescribe, educate and counsel  regarding individualized specific dietary modifications aiming towards targeted core components such as weight, hypertension, lipid management, diabetes, heart failure and other comorbidities.;Nutrition handout(s) given to patient.    Expected Outcomes  Short Term Goal: Understand basic principles of dietary content, such as calories, fat, sodium, cholesterol and nutrients.;Long Term Goal: Adherence to prescribed nutrition plan.       Nutrition Assessments:   Nutrition Goals Re-Evaluation: Nutrition Goals Re-Evaluation    Row Name 08/28/18 1224             Goals   Nutrition Goal  Nerea has aides/family that cooks for her. She has an appetite, but has a hard time deciding what to eat. She tries to eat low sodium as much as she can.        Expected Outcome  Short: Tama will make low sodium choices on a daily basis. Long: Renell will become indpendent with maintaining a heart healthy diet          Nutrition Goals Discharge (Final Nutrition Goals Re-Evaluation): Nutrition Goals Re-Evaluation - 08/28/18 1224      Goals   Nutrition Goal  Talecia has aides/family that cooks for her. She has an appetite, but has a hard time deciding what to eat. She tries to eat low sodium as much as she can.     Expected Outcome  Short: Dorothy will make low sodium choices on a daily basis. Long: Farris will become indpendent with maintaining a heart healthy diet       Psychosocial: Target Goals: Acknowledge presence or absence of significant depression and/or stress, maximize coping skills, provide positive support system. Participant is able to verbalize types and ability to use techniques and skills needed for reducing stress and depression.   Initial Review & Psychosocial Screening: Initial Psych Review & Screening - 07/21/18 1511      Initial Review   Current issues with  Current Stress Concerns    Source of Stress Concerns  Chronic Illness;Family    Comments  She states her son  stresses her out. She states that he tries to be a doctor for her.      Family Dynamics   Good Support System?  Yes    Comments  She can look to her daugter for support who lives with her.      Barriers   Psychosocial barriers to participate in program  The patient should benefit from training in stress management and relaxation.      Screening Interventions   Interventions  Encouraged to exercise;To provide support and resources with identified psychosocial needs;Program counselor consult;Provide feedback about the scores to participant    Expected Outcomes  Short Term goal: Utilizing psychosocial counselor, staff and physician to assist with identification of specific Stressors or current issues interfering with healing process. Setting desired goal for each stressor or current issue identified.;Long Term Goal: Stressors or current issues are controlled or eliminated.;Short Term goal: Identification and review with participant of any Quality of Life  or Depression concerns found by scoring the questionnaire.;Long Term goal: The participant improves quality of Life and PHQ9 Scores as seen by post scores and/or verbalization of changes       Quality of Life Scores:  Scores of 19 and below usually indicate a poorer quality of life in these areas.  A difference of  2-3 points is a clinically meaningful difference.  A difference of 2-3 points in the total score of the Quality of Life Index has been associated with significant improvement in overall quality of life, self-image, physical symptoms, and general health in studies assessing change in quality of life.  PHQ-9: Recent Review Flowsheet Data    Depression screen [facility] 2/9 08/12/2018 07/27/2018 07/21/2018 10/27/2017 09/25/2015   Decreased Interest 1 0 1 0 0   Down, Depressed, Hopeless 1 0 2 0 0   PHQ - 2 Score 2 0 3 0 0   Altered sleeping 3 3 3  - 3   Tired, decreased energy 3 2 2  - 3   Change in appetite 1 3 3  - 0   Feeling bad or failure about  yourself  0 1 2 - 0   Trouble concentrating 1 2 2  - 0   Moving slowly or fidgety/restless 0 1 1 - 0   Suicidal thoughts 0 0 0 - 0   PHQ-9 Score 10 12 16  - 6   Difficult doing work/chores Somewhat difficult Somewhat difficult Very difficult - Somewhat difficult     Interpretation of Total Score  Total Score Depression Severity:  1-4 = Minimal depression, 5-9 = Mild depression, 10-14 = Moderate depression, 15-19 = Moderately severe depression, 20-27 = Severe depression   Psychosocial Evaluation and Intervention: Psychosocial Evaluation - 07/27/18 1230      Psychosocial Evaluation & Interventions   Interventions  Stress management education;Encouraged to exercise with the program and follow exercise prescription    Comments  Ms. Weirauch [name]) has returned to this program after several years of being out.  She reports having recent back surgery and a heart valve replaced and has chronic back pain.  She has a strong support system with a son and daughter and lots of friends and neighbors.  She also has a private home health agency who comes daily in the mornings and helps her.  She reports sleeping "terrible" with about 4 hours/night and her appetite was good until recently.  She denies a history of depression or anxiety but states she has stress with her health and her adult children insisting that she do certain things like attending this program.  She has goals to "get my kids off my back!"  Staff will follow with her.    Expected Outcomes  Short:  Wenonah will exercise for her health and for stress management.  Long:  She will learn ways to sleep better and manage her stress in more positive ways.     Continue Psychosocial Services   Follow up required by staff       Psychosocial Re-Evaluation: Psychosocial Re-Evaluation    [place] Name 08/12/18 1445             Psychosocial Re-Evaluation   Current issues with  Current Stress Concerns       Comments  Reviewed patient health  questionnaire (PHQ-9) with patient for follow up. Previously, patients score indicated signs/symptoms of depression.  Reviewed to see if patient is improving symptom wise while in program.  Score improved and patient states that it is because she is able  to exercise and condition herself. Her Sleep is still lacking and finds it hard to stay asleep.       Expected Outcomes  Short: Continue to attend [facility] regularly for regular exercise and social engagement. Long: Continue to improve symptoms and manage a positive mental state.       Interventions  Encouraged to attend Pul[facility] Rehabilitation for the exercise       Continue Psychosocial Services   Follow up required by staff          Psychosocial Discharge (Final Psychosocial Re-Evaluation): Psychosocial Re-Evaluation - 08/12/18 1445      Psychosocial Re-Evaluation   Current issues with  Current Stress Concerns    Comments  Reviewed patient health questionnaire (PHQ-9) with patient for follow up. Previously, patients score indicated signs/symptoms of depression.  Reviewed to see if patient is improving symptom wise while in program.  Score improved and patient states that it is because she is able to exercise and condition herself. Her Sleep is still lacking and finds it hard to stay asleep.    Expected Outcomes  Short: Continue to attend [facility] regularly for regular exercise and social engagement. Long: Continue to improve symptoms and manage a positive mental state.    Interventions  Encouraged to attend Pulmonary Rehabilitation for the exercise    Continue Psychosocial Services   Follow up required by staff       Education: Education Goals: Education classes will be provided on a weekly basis, covering required topics. Participant will state understanding/return demonstration of topics presented.  Learning Barriers/Preferences: Learning Barriers/Preferences - 07/21/18 1516      Learning Barriers/Preferences   Learning Barriers   Sight   wears glasses   Learning Preferences  None       Education Topics:  Initial Evaluation Education: - Verbal, written and demonstration of respiratory meds, oximetry and breathing techniques. Instruction on use of nebulizers and MDIs and importance of monitoring MDI activations.   Pulmonary Rehab from 09/04/2018 in [facility] Cardiac and Pulmonary Rehab  Date  07/21/18  Educator  [facility]  Instruction Review Code  1- Verbalizes Understanding      General Nutrition Guidelines/Fats and Fiber: -Group instruction provided by verbal, written material, models and posters to present the general guidelines for heart healthy nutrition. Gives an explanation and review of dietary fats and fiber.   Pulmonary Rehab from 09/04/2018 in [facility] Cardiac and Pulmonary Rehab  Date  08/12/18  Educator  [facility]  Instruction Review Code  1- Verbalizes Understanding      Controlling Sodium/Reading Food Labels: -Group verbal and written material supporting the discussion of sodium use in heart healthy nutrition. Review and explanation with models, verbal and written materials for utilization of the food label.   Pulmonary Rehab from 09/04/2018 in [facility] Cardiac and Pulmonary Rehab  Date  08/19/18  Educator  [facility]  Instruction Review Code  1- Verbalizes Understanding      Exercise Physiology & General Exercise Guidelines: - Group verbal and written instruction with models to review the exercise physiology of the cardiovascular system and associated critical values. Provides general exercise guidelines with specific guidelines to those with heart or lung disease.    Cardiac Rehab from 02/14/2016 in [facility] Cardiac and Pulmonary Rehab  Date  02/05/16  Educator  [facility]  Instruction Review Code (retired)  2- meets goals/outcomes      Aerobic Exercise & Resistance Training: - Gives group verbal and written instruction on the various components of exercise. Focuses on aerobic and resistive  training programs and the benefits of this  training and how to safely progress through these programs.   Cardiac Rehab from 02/14/2016 in [facility] Cardiac and Pulmonary Rehab  Date  02/07/16  Educator  [facility]  Instruction Review Code (retired)  2- Statistician, Balance, Mind/Body Relaxation: Provides group verbal/written instruction on the benefits of flexibility and balance training, including mind/body exercise modes such as yoga, pilates and tai chi.  Demonstration and skill practice provided.   Pulmonary Rehab from 09/04/2018 in [facility] Cardiac and Pulmonary Rehab  Date  07/29/18  Educator  [facility]  Instruction Review Code  1- Verbalizes Understanding      Stress and Anxiety: - Provides group verbal and written instruction about the health risks of elevated stress and causes of high stress.  Discuss the correlation between heart/lung disease and anxiety and treatment options. Review healthy ways to manage with stress and anxiety.   Pulmonary Rehab from 09/04/2018 in [facility] Cardiac and Pulmonary Rehab  Date  08/05/18  Educator  [facility]  Instruction Review Code  1- Verbalizes Understanding      Depression: - Provides group verbal and written instruction on the correlation between heart/lung disease and depressed mood, treatment options, and the stigmas associated with seeking treatment.   Pulmonary Rehab from 09/04/2018 in [facility] Cardiac and Pulmonary Rehab  Date  09/02/18  Educator  [place]  Instruction Review Code  1- Verbalizes Understanding      Exercise & Equipment Safety: - Individual verbal instruction and demonstration of equipment use and safety with use of the equipment.   Pulmonary Rehab from 09/04/2018 in [facility] Cardiac and Pulmonary Rehab  Date  07/21/18  Educator  [facility]  Instruction Review Code  1- Verbalizes Understanding      Infection Prevention: - Provides verbal and written material to individual with discussion of infection control including proper hand washing and proper equipment cleaning during  exercise session.   Pulmonary Rehab from 09/04/2018 in [facility] Cardiac and Pulmonary Rehab  Date  07/21/18  Educator  [facility]  Instruction Review Code  1- Verbalizes Understanding      Falls Prevention: - Provides verbal and written material to individual with discussion of falls prevention and safety.   Pulmonary Rehab from 09/04/2018 in [facility] Cardiac and Pulmonary Rehab  Date  07/21/18  Educator  [facility]  Instruction Review Code  1- Verbalizes Understanding      Diabetes: - Individual verbal and written instruction to review signs/symptoms of diabetes, desired ranges of glucose level fasting, after meals and with exercise. Advice that pre and post exercise glucose checks will be done for 3 sessions at entry of program.   Chronic Lung Diseases: - Group verbal and written instruction to review updates, respiratory medications, advancements in procedures and treatments. Discuss use of supplemental oxygen including available portable oxygen systems, continuous and intermittent flow rates, concentrators, personal use and safety guidelines. Review proper use of inhaler and spacers. Provide informative websites for self-education.    Pulmonary Rehab from 09/04/2018 in [facility] Cardiac and Pulmonary Rehab  Date  08/07/18  Educator  [facility]  Instruction Review Code  1- Verbalizes Understanding      Energy Conservation: - Provide group verbal and written instruction for methods to conserve energy, plan and organize activities. Instruct on pacing techniques, use of adaptive equipment and posture/positioning to relieve shortness of breath.   Pulmonary Rehab from 09/04/2018 in [facility] Cardiac and Pulmonary Rehab  Date  09/04/18  Educator  [facility]  Instruction  Review Code  1- Verbalizes Understanding      Triggers and Exacerbations: - Group verbal and written instruction to review types of environmental triggers and ways to prevent exacerbations. Discuss weather changes, air quality and the benefits of nasal washing. Review  warning signs and symptoms to help prevent infections. Discuss techniques for effective airway clearance, coughing, and vibrations.   AED/CPR: - Group verbal and written instruction with the use of models to demonstrate the basic use of the AED with the basic ABC's of resuscitation.   Anatomy and Physiology of the Lungs: - Group verbal and written instruction with the use of models to provide basic lung anatomy and physiology related to function, structure and complications of lung disease.   Anatomy & Physiology of the Heart: - Group verbal and written instruction and models provide basic cardiac anatomy and physiology, with the coronary electrical and arterial systems. Review of Valvular disease and Heart Failure   Cardiac Rehab from 02/14/2016 in [facility] Cardiac and Pulmonary Rehab  Date  12/25/15  Educator  [facility]  Instruction Review Code (retired)  2- meets goals/outcomes      Cardiac Medications: - Group verbal and written instruction to review commonly prescribed medications for heart disease. Reviews the medication, class of the drug, and side effects.   Cardiac Rehab from 02/14/2016 in [facility] Cardiac and Pulmonary Rehab  Date  01/15/16 Marisue Humble 2]  Educator  [place]  Instruction Review Code (retired)  2- meets goals/outcomes      Know Your Numbers and Risk Factors: -Group verbal and written instruction about important numbers in your health.  Discussion of what are risk factors and how they play a role in the disease process.  Review of Cholesterol, Blood Pressure, Diabetes, and BMI and the role they play in your overall health.   Pulmonary Rehab from 09/04/2018 in [facility] Cardiac and Pulmonary Rehab  Date  08/26/18  Educator  [facility]  Instruction Review Code  1- Verbalizes Understanding      Sleep Hygiene: -Provides group verbal and written instruction about how sleep can affect your health.  Define sleep hygiene, discuss sleep cycles and impact of sleep habits. Review good sleep hygiene tips.     Other: -Provides group and verbal instruction on various topics (see comments)    Knowledge Questionnaire Score: Knowledge Questionnaire Score - 07/21/18 1450      Knowledge Questionnaire Score   Pre Score  12/18   reviewed with patient       Core Components/Risk Factors/Patient Goals at Admission: Personal Goals and Risk Factors at Admission - 07/21/18 1516      Core Components/Risk Factors/Patient Goals on Admission    Weight Management  Yes;Weight Maintenance    Intervention  Weight Management: Develop a combined nutrition and exercise program designed to reach desired caloric intake, while maintaining appropriate intake of nutrient and fiber, sodium and fats, and appropriate energy expenditure required for the weight goal.;Weight Management: Provide education and appropriate resources to help participant work on and attain dietary goals.    Admit Weight  144 lb (65.3 kg)    Goal Weight: Short Term  140 lb (63.5 kg)    Goal Weight: Long Term  140 lb (63.5 kg)    Expected Outcomes  Short Term: Continue to assess and modify interventions until short term weight is achieved;Long Term: Adherence to nutrition and physical activity/exercise program aimed toward attainment of established weight goal;Weight Maintenance: Understanding of the daily nutrition guidelines, which includes 25-35% calories from fat, 7% or less  cal from saturated fats, less than 237m cholesterol, less than 1.5gm of sodium, & 5 or more servings of fruits and vegetables daily;Understanding recommendations for meals to include 15-35% energy as protein, 25-35% energy from fat, 35-60% energy from carbohydrates, less than 2061mof dietary cholesterol, 20-35 gm of total fiber daily;Understanding of distribution of calorie intake throughout the day with the consumption of 4-5 meals/snacks    Improve shortness of breath with ADL's  Yes    Intervention  Provide education, individualized exercise plan and daily activity  instruction to help decrease symptoms of SOB with activities of daily living.    Expected Outcomes  Short Term: Improve cardiorespiratory fitness to achieve a reduction of symptoms when performing ADLs;Long Term: Be able to perform more ADLs without symptoms or delay the onset of symptoms    Heart Failure  Yes    Intervention  Provide a combined exercise and nutrition program that is supplemented with education, support and counseling about heart failure. Directed toward relieving symptoms such as shortness of breath, decreased exercise tolerance, and extremity edema.    Expected Outcomes  Improve functional capacity of life;Short term: Attendance in program 2-3 days a week with increased exercise capacity. Reported lower sodium intake. Reported increased fruit and vegetable intake. Reports medication compliance.;Short term: Daily weights obtained and reported for increase. Utilizing diuretic protocols set by physician.;Long term: Adoption of self-care skills and reduction of barriers for early signs and symptoms recognition and intervention leading to self-care maintenance.       Core Components/Risk Factors/Patient Goals Review:  Goals and Risk Factor Review    Row Name 08/28/18 1219             Core Components/Risk Factors/Patient Goals Review   Personal Goals Review  Weight Management/Obesity;Improve shortness of breath with ADL's;Heart Failure;Develop more efficient breathing techniques such as purse lipped breathing and diaphragmatic breathing and practicing self-pacing with activity.       Review  MaLynzis continuing to weight herself daily and keep a log. She is managing her heart failure symptoms well. She has had a productive cough mostly in the evenings, she has made her MDs aware and they tell her its due to age. She has noticed an improvement with her SOB since starting. She wants to keep increasing her strength while here as she can already tell a difference standing up straight  with her breathing and her daily activities       Expected Outcomes  Short: MaGayill continue to come to LW to work on strength and breathing with ADLs. Long: MaLacreciaill maintain her strength and stand up right while doing activities and sitting.           Core Components/Risk Factors/Patient Goals at Discharge (Final Review):  Goals and Risk Factor Review - 08/28/18 1219      Core Components/Risk Factors/Patient Goals Review   Personal Goals Review  Weight Management/Obesity;Improve shortness of breath with ADL's;Heart Failure;Develop more efficient breathing techniques such as purse lipped breathing and diaphragmatic breathing and practicing self-pacing with activity.    Review  MaIriels continuing to weight herself daily and keep a log. She is managing her heart failure symptoms well. She has had a productive cough mostly in the evenings, she has made her MDs aware and they tell her its due to age. She has noticed an improvement with her SOB since starting. She wants to keep increasing her strength while here as she can already tell a difference standing up straight with  her breathing and her daily activities    Expected Outcomes  Short: Kyara will continue to come to LW to work on strength and breathing with ADLs. Long: Zianne will maintain her strength and stand up right while doing activities and sitting.        ITP Comments: ITP Comments    Row Name 07/21/18 1429 08/10/18 0843 09/07/18 0825       ITP Comments  Medical Evaluation completed. Chart sent for review and changes to Dr. [name] Director of [place]. Diagnosis can be found in [facility] encounter 07/09/2018  30 day review completed. ITP sent to Dr. [name] Director of [place]. Continue with ITP unless changes are made by physician.  30 day review completed. ITP sent to Dr. [name] Director of [place]. Continue with ITP unless changes are made by physician.        Comments: 30 day review

## 2018-09-07 NOTE — Progress Notes (Signed)
Daily Session Note  Patient Details  Name: [name] MRN: [id number] Date of Birth: May 20, 1935 Referring Provider:     Pulmonary Rehab from 07/21/2018 in [facility] Cardiac and Pulmonary Rehab  Referring Provider  [name] MD      Encounter Date: 09/07/2018  Check In: Session Check In - 09/07/18 1201      Check-In   Supervising physician immediately available to respond to emergencies  [facility] immediately available ER MD    Physician(s)  Dr. Jimmye Norman and Dr. Quentin Cornwall    Location  [facility]-Cardiac & Pulmonary Rehab    Staff Present  Earlean Shawl, BS, ACSM CEP, Exercise Physiologist;Amanda Oletta Darter, BA, ACSM CEP, Exercise Physiologist;Joseph Tessie Fass RCP,RRT,BSRT    Medication changes reported      No    Fall or balance concerns reported     No    Warm-up and Cool-down  Performed as group-led instruction    Resistance Training Performed  Yes    VAD Patient?  No    PAD/SET Patient?  No      VAD patient   Has back up controller?  No      Pain Assessment   Currently in Pain?  No/denies    Multiple Pain Sites  No          Social History   Tobacco Use  Smoking Status Former Smoker  . Packs/day: 1.00  . Years: 27.00  . Pack years: 27.00  . Types: Cigarettes  . Last attempt to quit: 07/01/1980  . Years since quitting: 38.2  Smokeless Tobacco Never Used  Tobacco Comment   "quit smoking ~ 1980    Goals Met:  Proper associated with RPD/PD & O2 Sat Independence with exercise equipment Exercise tolerated well No report of cardiac concerns or symptoms Strength training completed today  Goals Unmet:  Not Applicable  Comments: Pt able to follow exercise prescription today without complaint.  Will continue to monitor for progression.    Dr. [name] is Medical Director for [place] and [facility].

## 2018-09-09 ENCOUNTER — Encounter: Payer: Medicare Other | Admitting: *Deleted

## 2018-09-09 ENCOUNTER — Other Ambulatory Visit: Payer: Self-pay

## 2018-09-09 DIAGNOSIS — J9611 Chronic respiratory failure with hypoxia: Secondary | ICD-10-CM | POA: Diagnosis not present

## 2018-09-09 DIAGNOSIS — Z9981 Dependence on supplemental oxygen: Principal | ICD-10-CM

## 2018-09-09 NOTE — Progress Notes (Signed)
Daily Session Note  Patient Details  Name: [name] MRN: [id number] Date of Birth: 1934/09/06 Referring Provider:     Pulmonary Rehab from 07/21/2018 in [facility] Cardiac [facility] Pul[facility] Rehab  Referring Provider  [name] MD      Encounter Date: 09/09/2018  Check In: Session Check In - 09/09/18 1123      Check-In   Supervising physician immediately available to respond to emergencies  [facility] immediately available ER MD    Physician(s)  Drs. Glena Norfolk    Location  [facility]-Cardiac & Pulmonary Rehab    Staff Present  Alberteen Sam, MA, RCEP, CCRP, Exercise Physiologist;Meredith Sherryll Burger, RN BSN;Joseph Tessie Fass RCP,RRT,BSRT    Medication changes reported      No    Fall or balance concerns reported     No    Warm-up and Cool-down  Performed as group-led instruction    Resistance Training Performed  Yes    VAD Patient?  No    PAD/SET Patient?  No          Social History   Tobacco Use  Smoking Status Former Smoker  . Packs/day: 1.00  . Years: 27.00  . Pack years: 27.00  . Types: Cigarettes  . Last attempt to quit: 07/01/1980  . Years since quitting: 38.2  Smokeless Tobacco Never Used  Tobacco Comment   "quit smoking ~ 1980    Goals Met:  Proper associated with RPD/PD & O2 Sat Independence with exercise equipment Using PLB without cueing & demonstrates good technique Exercise tolerated well Personal goals reviewed No report of cardiac concerns or symptoms Strength training completed today  Goals Unmet:  Not Applicable  Comments: Pt able to follow exercise prescription today without complaint.  Will continue to monitor for progression.    Dr. [name] is Medical Director for [facility] and [facility].

## 2018-09-11 ENCOUNTER — Other Ambulatory Visit: Payer: Self-pay

## 2018-09-11 DIAGNOSIS — J9611 Chronic respiratory failure with hypoxia: Secondary | ICD-10-CM | POA: Diagnosis not present

## 2018-09-11 DIAGNOSIS — Z9981 Dependence on supplemental oxygen: Principal | ICD-10-CM

## 2018-09-11 NOTE — Progress Notes (Signed)
Daily Session Note  Patient Details  Name: [name] MRN: [id number] Date of Birth: 1935/03/30 Referring Provider:     Pulmonary Rehab from 07/21/2018 in [facility] Cardiac and Pulmonary Rehab  Referring Provider  [name] MD      Encounter Date: 09/11/2018  Check In: Session Check In - 09/11/18 1139      Check-In   Supervising physician immediately available to respond to emergencies  [facility] immediately available ER MD    Physician(s)  Dr. Burlene Arnt and Corky Downs    Location  [facility]-Cardiac & Pulmonary Rehab    Staff Present  Justin Mend RCP,RRT,BSRT;Jessica Luan Pulling, MA, RCEP, CCRP, Exercise Physiologist;Krista Frederico Hamman, RN BSN    Medication changes reported      No    Fall or balance concerns reported     No    Warm-up and Cool-down  Performed as group-led Higher education careers adviser Performed  Yes    VAD Patient?  No    PAD/SET Patient?  No      Pain Assessment   Currently in Pain?  No/denies          Social History   Tobacco Use  Smoking Status Former Smoker  . Packs/day: 1.00  . Years: 27.00  . Pack years: 27.00  . Types: Cigarettes  . Last attempt to quit: 07/01/1980  . Years since quitting: 38.2  Smokeless Tobacco Never Used  Tobacco Comment   "quit smoking ~ 1980    Goals Met:  Independence with exercise equipment Exercise tolerated well No report of cardiac concerns or symptoms Strength training completed today  Goals Unmet:  Not Applicable  Comments: Pt able to follow exercise prescription today without complaint.  Will continue to monitor for progression.    Dr. [name] is Medical Director for [facility] and [facility].

## 2018-09-21 DIAGNOSIS — J9611 Chronic respiratory failure with hypoxia: Secondary | ICD-10-CM

## 2018-09-21 DIAGNOSIS — Z9981 Dependence on supplemental oxygen: Principal | ICD-10-CM

## 2018-09-24 ENCOUNTER — Other Ambulatory Visit: Payer: Self-pay | Admitting: Cardiovascular Disease

## 2018-09-24 DIAGNOSIS — I483 Typical atrial flutter: Secondary | ICD-10-CM

## 2018-09-28 ENCOUNTER — Telehealth: Payer: Self-pay

## 2018-09-28 NOTE — Telephone Encounter (Signed)
Tried to contact patient to change current office visit to an e-visit however her mailbox is full-unable to leave a voicemail message.

## 2018-09-29 ENCOUNTER — Other Ambulatory Visit: Payer: Self-pay

## 2018-09-30 ENCOUNTER — Ambulatory Visit (INDEPENDENT_AMBULATORY_CARE_PROVIDER_SITE_OTHER): Payer: Medicare Other

## 2018-09-30 DIAGNOSIS — Z952 Presence of prosthetic heart valve: Secondary | ICD-10-CM

## 2018-09-30 DIAGNOSIS — Z5181 Encounter for therapeutic drug level monitoring: Secondary | ICD-10-CM

## 2018-09-30 DIAGNOSIS — I480 Paroxysmal atrial fibrillation: Secondary | ICD-10-CM

## 2018-09-30 LAB — POCT INR: INR: 2.1 (ref 2.0–3.0)

## 2018-09-30 NOTE — Patient Instructions (Signed)
Please START NEW DOSAGE of 3mg  (1/2 tablet) daily, EXCEPT WHOLE PILL ON MONDAYS, [place]. Recheck in 4 weeks.

## 2018-10-01 ENCOUNTER — Other Ambulatory Visit: Payer: Self-pay | Admitting: Pharmacist

## 2018-10-01 MED ORDER — WARFARIN SODIUM 6 MG PO TABS: ORAL_TABLET | ORAL | 1 refills | Status: DC

## 2018-10-05 ENCOUNTER — Encounter: Payer: Self-pay | Admitting: *Deleted

## 2018-10-05 DIAGNOSIS — J9611 Chronic respiratory failure with hypoxia: Secondary | ICD-10-CM

## 2018-10-05 DIAGNOSIS — Z9981 Dependence on supplemental oxygen: Principal | ICD-10-CM

## 2018-10-05 NOTE — Progress Notes (Signed)
Pulmonary Individual Treatment Plan  [name] Details  Name: [name] MRN: [id number] Date of Birth: 1934/12/29 Referring Provider:     Pulmonary Rehab from 07/21/2018 in [facility] Cardiac and Pulmonary Rehab  Referring Provider  [name] MD      Initial Encounter Date:    Pulmonary Rehab from 07/21/2018 in [facility] Cardiac and Pulmonary Rehab  Date  07/21/18      Visit Diagnosis: Chronic respiratory failure with hypoxia, on home O2 therapy ([place])  [name]'s Home Medications on Admission:  Current Outpatient Medications:  .  acetaminophen (TYLENOL) 650 MG CR tablet, Take 650 mg by mouth every 8 (eight) hours as needed for pain., Disp: , Rfl:  .  amiodarone (PACERONE) 200 MG tablet, Take 1 tablet (200 mg total) by mouth daily., Disp: 90 tablet, Rfl: 3 .  aspirin EC 81 MG tablet, Take 81 mg by mouth daily., Disp: , Rfl:  .  Cholecalciferol (VITAMIN D) 2000 units tablet, Take 2,000 Units by mouth daily., Disp: , Rfl:  .  furosemide (LASIX) 20 MG tablet, Take 1 tablet (20 mg total) by mouth 3 (three) times daily., Disp: 270 tablet, Rfl: 3 .  KLOR-CON 10 10 MEQ tablet, Take 10 mEq by mouth daily. , Disp: , Rfl:  .  losartan (COZAAR) 25 MG tablet, TAKE 1/2 TABLET EVERY DAY, Disp: 30 tablet, Rfl: 3 .  Menthol (ICY HOT) 5 % PTCH, Apply 1 patch topically daily as needed (pain)., Disp: , Rfl:  .  metolazone (ZAROXOLYN) 5 MG tablet, Take 1 tablet (5 mg total) by mouth as needed (As needed for weight greater than 148)., Disp: 90 tablet, Rfl: 3 .  Multiple Vitamin (MULTIVITAMIN WITH MINERALS) TABS tablet, Take 1 tablet by mouth daily., Disp: , Rfl:  .  Probiotic Product (RISA-BID PROBIOTIC PO), Take 1 capsule by mouth daily. , Disp: , Rfl:  .  Simethicone (GAS-X PO), Take by mouth., Disp: , Rfl:  .  spironolactone (ALDACTONE) 25 MG tablet, Take 25 mg by mouth daily. , Disp: , Rfl:  .  vitamin C (ASCORBIC ACID) 500 MG tablet, Take 500 mg by mouth daily., Disp: , Rfl:  .  warfarin (COUMADIN) 6  MG tablet, Take 1/2 or 1 tablet daily as directed by Coumadin clinic, Disp: 30 tablet, Rfl: 1  Past Medical History: Past Medical History:  Diagnosis Date  . Arthritis    "back, hands, knees" (04/10/2016)  . Chronic atrial fibrillation    a. On Coumadin  . Chronic back pain   . 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 EF 25% by echo in 08/2015 b. RHC in 08/2015 showed normal filling pressures  . Chronic kidney disease   . Compression fracture    "several; all in my back" (04/10/2016)  . GERD (gastroesophageal reflux disease)   . Hypertension   . Lymphedema   . S/P MVR (mitral valve replacement)    a. MVR 1994 b. redo MVR in 08/2014 - on Coumadin  . Shortness of breath dyspnea     Tobacco Use: Social History   Tobacco Use  Smoking Status Former Smoker  . Packs/day: 1.00  . Years: 27.00  . Pack years: 27.00  . Types: Cigarettes  . Last attempt to quit: 07/01/1980  . Years since quitting: 38.2  Smokeless Tobacco Never Used  Tobacco Comment   "quit smoking ~ 1980    Labs: Recent Review Flowsheet Data    There is no flowsheet data to display.  Pulmonary Assessment Scores: Pulmonary Assessment Scores    Row Name 07/21/18 1451         ADL UCSD   ADL Phase  Entry     SOB Score total  100     Rest  3     Walk  3     Stairs  4     Bath  5     Dress  5     Shop  5       CAT Score   CAT Score  24       mMRC Score   mMRC Score  4        Pulmonary Function Assessment: Pulmonary Function Assessment - 07/21/18 1511      Breath   Bilateral Breath Sounds  Clear;Decreased    Shortness of Breath  Yes;Limiting activity       Exercise Target Goals: Exercise Program Goal: Individual exercise prescription set using results from initial 6 min walk test and THRR while considering  [name]'s activity barriers and safety.   Exercise Prescription Goal: Initial exercise prescription builds to 30-45 minutes a  day of aerobic activity, 2-3 days per week.  Home exercise guidelines will be given to [name] during program as part of exercise prescription that the participant will acknowledge.  Activity Barriers & Risk Stratification: Activity Barriers & Cardiac Risk Stratification - 07/21/18 1520      Activity Barriers & Cardiac Risk Stratification   Activity Barriers  Arthritis;Back Problems;Deconditioning;Muscular Weakness;Shortness of Breath;Balance Concerns;Assistive Device   hx of multiple back fractures      6 Minute Walk: 6 Minute Walk    Row Name 07/21/18 1517         6 Minute Walk   Phase  Initial     Distance  580 feet     Walk Time  6 minutes     # of Rest Breaks  0     MPH  1.09     METS  1.01     RPE  11     Perceived Dyspnea   3     VO2 Peak  3.54     Symptoms  Yes (comment)     Comments  chronic back pain     Resting HR  72 bpm     Resting BP  126/62     Resting Oxygen Saturation   94 %     Exercise Oxygen Saturation  during 6 min walk  80 %     Max Ex. HR  83 bpm     Max Ex. BP  164/74     2 Minute Post BP  136/64       Interval HR   1 Minute HR  72     2 Minute HR  76     3 Minute HR  79     4 Minute HR  69     5 Minute HR  71     6 Minute HR  83     2 Minute Post HR  73     Interval Heart Rate?  Yes       Interval Oxygen   Interval Oxygen?  Yes     Baseline Oxygen Saturation %  94 %     1 Minute Oxygen Saturation %  88 %     1 Minute Liters of Oxygen  2 L continuous     2 Minute Oxygen Saturation %  83 %  2 Minute Liters of Oxygen  2 L     3 Minute Oxygen Saturation %  82 %     3 Minute Liters of Oxygen  2 L     4 Minute Oxygen Saturation %  82 %     4 Minute Liters of Oxygen  2 L     5 Minute Oxygen Saturation %  80 %     5 Minute Liters of Oxygen  2 L     6 Minute Oxygen Saturation %  87 %     6 Minute Liters of Oxygen  2 L     2 Minute Post Oxygen Saturation %  98 %     2 Minute Post Liters of Oxygen  2 L       Oxygen Initial  Assessment: Oxygen Initial Assessment - 07/21/18 1510      Home Oxygen   Home Oxygen Device  Home Concentrator;E-Tanks    Sleep Oxygen Prescription  Continuous    Liters per minute  2    Home Exercise Oxygen Prescription  Continuous    Liters per minute  2    Home at Rest Exercise Oxygen Prescription  Continuous    Liters per minute  2    Compliance with Home Oxygen Use  Yes      Initial 6 min Walk   Oxygen Used  Continuous;E-Tanks    Liters per minute  2      Program Oxygen Prescription   Program Oxygen Prescription  Continuous    Liters per minute  3      Intervention   Short Term Goals  To learn and exhibit compliance with exercise, home and travel O2 prescription;To learn and understand importance of monitoring SPO2 with pulse oximeter and demonstrate accurate use of the pulse oximeter.;To learn and understand importance of maintaining oxygen saturations>88%;To learn and demonstrate proper pursed lip breathing techniques or other breathing techniques.    Long  Term Goals  Verbalizes importance of monitoring SPO2 with pulse oximeter and return demonstration;Exhibits compliance with exercise, home and travel O2 prescription;Maintenance of O2 saturations>88%;Exhibits proper breathing techniques, such as pursed lip breathing or other method taught during program session;Compliance with respiratory medication       Oxygen Re-Evaluation: Oxygen Re-Evaluation    Row Name 07/27/18 1345 08/28/18 1225           Program Oxygen Prescription   Program Oxygen Prescription  Continuous  Continuous      Liters per minute  3  3        Home Oxygen   Home Oxygen Device  Home Concentrator;E-Tanks  Home Concentrator;E-Tanks      Sleep Oxygen Prescription  Continuous  Continuous      Liters per minute  2  2      Home Exercise Oxygen Prescription  Continuous  Continuous      Liters per minute  2  2      Home at Rest Exercise Oxygen Prescription  Continuous  Continuous      Liters per  minute  2  2      Compliance with Home Oxygen Use  Yes  Yes        Goals/Expected Outcomes   Short Term Goals  To learn and exhibit compliance with exercise, home and travel O2 prescription;To learn and understand importance of monitoring SPO2 with pulse oximeter and demonstrate accurate use of the pulse oximeter.;To learn and understand importance of maintaining oxygen saturations>88%;To learn and demonstrate proper  pursed lip breathing techniques or other breathing techniques.  To learn and exhibit compliance with exercise, home and travel O2 prescription;To learn and understand importance of monitoring SPO2 with pulse oximeter and demonstrate accurate use of the pulse oximeter.;To learn and understand importance of maintaining oxygen saturations>88%;To learn and demonstrate proper pursed lip breathing techniques or other breathing techniques.      Long  Term Goals  Verbalizes importance of monitoring SPO2 with pulse oximeter and return demonstration;Exhibits compliance with exercise, home and travel O2 prescription;Maintenance of O2 saturations>88%;Exhibits proper breathing techniques, such as pursed lip breathing or other method taught during program session;Compliance with respiratory medication  Verbalizes importance of monitoring SPO2 with pulse oximeter and return demonstration;Exhibits compliance with exercise, home and travel O2 prescription;Maintenance of O2 saturations>88%;Exhibits proper breathing techniques, such as pursed lip breathing or other method taught during program session;Compliance with respiratory medication      Comments  Reviewed PLB technique with pt.  Talked about how it work and it's important to maintaining his exercise saturations.   Reviewed PLB technique with pt.  Talked about how it work and it's important to maintaining his exercise saturations.       Goals/Expected Outcomes  Short: Become more profiecient at using PLB.   Long: Become independent at using PLB.  Short:  [name] will focus more on standing up right, practicing PLB, and be mindful of any change in her cough. Long: [name] will be independent in her PLB         Oxygen Discharge (Final Oxygen Re-Evaluation): Oxygen Re-Evaluation - 08/28/18 1225      Program Oxygen Prescription   Program Oxygen Prescription  Continuous    Liters per minute  3      Home Oxygen   Home Oxygen Device  Home Concentrator;E-Tanks    Sleep Oxygen Prescription  Continuous    Liters per minute  2    Home Exercise Oxygen Prescription  Continuous    Liters per minute  2    Home at Rest Exercise Oxygen Prescription  Continuous    Liters per minute  2    Compliance with Home Oxygen Use  Yes      Goals/Expected Outcomes   Short Term Goals  To learn and exhibit compliance with exercise, home and travel O2 prescription;To learn and understand importance of monitoring SPO2 with pulse oximeter and demonstrate accurate use of the pulse oximeter.;To learn and understand importance of maintaining oxygen saturations>88%;To learn and demonstrate proper pursed lip breathing techniques or other breathing techniques.    Long  Term Goals  Verbalizes importance of monitoring SPO2 with pulse oximeter and return demonstration;Exhibits compliance with exercise, home and travel O2 prescription;Maintenance of O2 saturations>88%;Exhibits proper breathing techniques, such as pursed lip breathing or other method taught during program session;Compliance with respiratory medication    Comments  Reviewed PLB technique with pt.  Talked about how it work and it's important to maintaining his exercise saturations.     Goals/Expected Outcomes  Short: [name] will focus more on standing up right, practicing PLB, and be mindful of any change in her cough. Long: [name] will be independent in her PLB       Initial Exercise Prescription: Initial Exercise Prescription - 07/21/18 1500      Date of Initial Exercise RX and Referring Provider   Date   07/21/18    Referring Provider  [name] MD      Oxygen   Oxygen  Continuous    Liters  2  T5 Nustep   Level  1    SPM  80    Minutes  15    METs  1.5      Biostep-RELP   Level  1    SPM  50    Minutes  15    METs  1      Track   Laps  11    Minutes  15    METs  1.5      Prescription Details   Frequency (times per week)  3    Duration  Progress to 45 minutes of aerobic exercise without signs/symptoms of physical distress      Intensity   THRR 40-80% of Max Heartrate  99-126    Ratings of Perceived Exertion  11-13    Perceived Dyspnea  0-4      Progression   Progression  Continue to progress workloads to maintain intensity without signs/symptoms of physical distress.      Resistance Training   Training Prescription  Yes    Weight  3 lbs    Reps  10-15       Perform Capillary Blood Glucose checks as needed.  Exercise Prescription Changes: Exercise Prescription Changes    Row Name 07/21/18 1500 07/28/18 1400 08/11/18 1300 08/12/18 1200 08/25/18 1400     Response to Exercise   Blood Pressure (Admit)  126/62  112/60  118/64  -  124/66   Blood Pressure (Exercise)  164/74  112/60  -  -  -   Blood Pressure (Exit)  136/64  120/62  120/80  -  112/60   Heart Rate (Admit)  72 bpm  73 bpm  68 bpm  -  62 bpm   Heart Rate (Exercise)  83 bpm  80 bpm  74 bpm  -  69 bpm   Heart Rate (Exit)  73 bpm  61 bpm  60 bpm  -  66 bpm   Oxygen Saturation (Admit)  94 %  95 %  85 %  -  98 %   Oxygen Saturation (Exercise)  80 %  89 %  92 %  -  87 %   Oxygen Saturation (Exit)  98 %  99 %  98 %  -  94 %   Rating of Perceived Exertion (Exercise)  _0 -  15   Perceived Dyspnea (Exercise)  _1 -  2   Symptoms  chronic back pain and SOB  SOB  SOB  -  none   Comments  walk test results  first full day of exercise  -  -  -   Duration  -  Progress to 45 minutes of aerobic exercise without signs/symptoms of physical distress  Continue with 45 min of aerobic exercise without  signs/symptoms of physical distress.  -  Continue with 45 min of aerobic exercise without signs/symptoms of physical distress.   Intensity  -  THRR unchanged  THRR unchanged  -  THRR unchanged     Progression   Progression  -  Continue to progress workloads to maintain intensity without signs/symptoms of physical distress.  Continue to progress workloads to maintain intensity without signs/symptoms of physical distress.  -  Continue to progress workloads to maintain intensity without signs/symptoms of physical distress.   Average METs  -  1.67  1.77  -  1.95     Resistance Training   Training Prescription  -  Yes  Yes  -  Yes   Weight  -  3 lbs  ROM  -  ROM   Reps  -  10-15  10-15  -  10-15     Interval Training   Interval Training  -  No  No  -  No     Oxygen   Oxygen  -  Continuous  Continuous  -  Continuous   Liters  -  3  3  -  3     T5 Nustep   Level  -  1  1  -  1   Minutes  -  15  15  -  15   METs  -  1.5  1.7  -  2     Biostep-RELP   Level  -  1  1  -  2   Minutes  -  15  15  -  15   METs  -  2  2  -  2     Track   Laps  -  11  13  -  18   Minutes  -  15  15  -  15   METs  -  1.5  1.6  -  1.84     Home Exercise Plan   Plans to continue exercise at  -  -  -  Home (comment) walking, PT exercises  Home (comment) walking, PT exercises   Frequency  -  -  -  Add 2 additional days to program exercise sessions.  Add 2 additional days to program exercise sessions.   Initial Home Exercises Provided  -  -  -  08/12/18  08/12/18   Row Name 09/07/18 1600 09/23/18 1500           Response to Exercise   Blood Pressure (Admit)  112/82  112/60      Blood Pressure (Exit)  118/62  104/62      Heart Rate (Admit)  72 bpm  80 bpm      Heart Rate (Exercise)  73 bpm  78 bpm      Heart Rate (Exit)  62 bpm  63 bpm      Oxygen Saturation (Admit)  90 %  93 %      Oxygen Saturation (Exercise)  84 % ran out of air on her tank and she didn't know  86 %      Oxygen Saturation (Exit)  98 %   93 %      Rating of Perceived Exertion (Exercise)  15  15      Perceived Dyspnea (Exercise)  2  3      Symptoms  none  none      Duration  Continue with 45 min of aerobic exercise without signs/symptoms of physical distress.  Continue with 45 min of aerobic exercise without signs/symptoms of physical distress.      Intensity  THRR unchanged  THRR unchanged        Progression   Progression  Continue to progress workloads to maintain intensity without signs/symptoms of physical distress.  Continue to progress workloads to maintain intensity without signs/symptoms of physical distress.      Average METs  1.85  1.8        Resistance Training   Training Prescription  Yes  Yes      Weight  ROM  ROM      Reps  10-15  10-15  Interval Training   Interval Training  No  No        Oxygen   Oxygen  Continuous  Continuous      Liters  3  3        T5 Nustep   Level  2  2      Minutes  15  15      METs  1.8  1.8        Biostep-RELP   Level  3  3      Minutes  15  15      METs  3  2        Track   Laps  17  13      Minutes  15  15      METs  1.77  1.6        Home Exercise Plan   Plans to continue exercise at  Home (comment) walking, PT exercises  Home (comment) walking, PT exercises      Frequency  Add 2 additional days to program exercise sessions.  Add 2 additional days to program exercise sessions.      Initial Home Exercises Provided  08/12/18  08/12/18         Exercise Comments: Exercise Comments    Row Name 07/27/18 1251           Exercise Comments  First full day of exercise!  [name] was oriented to gym and equipment including functions, settings, policies, and procedures.  [name]'s individual exercise prescription and treatment plan were reviewed.  All starting workloads were established based on the results of the 6 minute walk test done at initial orientation visit.  The plan for exercise progression was also introduced and progression will be customized based on  [name]'s performance and goals.          Exercise Goals and Review: Exercise Goals    Row Name 07/21/18 1524             Exercise Goals   Increase Physical Activity  Yes       Intervention  Provide advice, education, support and counseling about physical activity/exercise needs.;Develop an individualized exercise prescription for aerobic and resistive training based on initial evaluation findings, risk stratification, comorbidities and participant's personal goals.       Expected Outcomes  Short Term: Attend rehab on a regular basis to increase amount of physical activity.;Long Term: Add in home exercise to make exercise part of routine and to increase amount of physical activity.;Long Term: Exercising regularly at least 3-5 days a week.       Increase Strength and Stamina  Yes       Intervention  Develop an individualized exercise prescription for aerobic and resistive training based on initial evaluation findings, risk stratification, comorbidities and participant's personal goals.;Provide advice, education, support and counseling about physical activity/exercise needs.       Expected Outcomes  Short Term: Increase workloads from initial exercise prescription for resistance, speed, and METs.;Long Term: Improve cardiorespiratory fitness, muscular endurance and strength as measured by increased METs and functional capacity (6MWT);Short Term: Perform resistance training exercises routinely during rehab and add in resistance training at home       Able to understand and use rate of perceived exertion (RPE) scale  Yes       Intervention  Provide education and explanation on how to use RPE scale       Expected Outcomes  Short Term: Able to use RPE daily  in rehab to express subjective intensity level;Long Term:  Able to use RPE to guide intensity level when exercising independently       Able to understand and use Dyspnea scale  Yes       Intervention  Provide education and explanation on how to  use Dyspnea scale       Expected Outcomes  Short Term: Able to use Dyspnea scale daily in rehab to express subjective sense of shortness of breath during exertion;Long Term: Able to use Dyspnea scale to guide intensity level when exercising independently       Knowledge and understanding of Target Heart Rate Range (THRR)  Yes       Intervention  Provide education and explanation of THRR including how the numbers were predicted and where they are located for reference       Expected Outcomes  Short Term: Able to state/look up THRR;Short Term: Able to use daily as guideline for intensity in rehab;Long Term: Able to use THRR to govern intensity when exercising independently       Able to check pulse independently  Yes       Intervention  Provide education and demonstration on how to check pulse in carotid and radial arteries.;Review the importance of being able to check your own pulse for safety during independent exercise       Expected Outcomes  Short Term: Able to explain why pulse checking is important during independent exercise;Long Term: Able to check pulse independently and accurately       Understanding of Exercise Prescription  Yes       Intervention  Provide education, explanation, and written materials on [name]'s individual exercise prescription       Expected Outcomes  Short Term: Able to explain program exercise prescription;Long Term: Able to explain home exercise prescription to exercise independently          Exercise Goals Re-Evaluation : Exercise Goals Re-Evaluation    Row Name 07/27/18 1251 08/11/18 1257 08/12/18 1230 08/25/18 1455 09/07/18 1623     Exercise Goal Re-Evaluation   Exercise Goals Review  Increase Physical Activity;Able to understand and use rate of perceived exertion (RPE) scale;Increase Strength and Stamina;Able to understand and use Dyspnea scale  Increase Physical Activity;Increase Strength and Stamina;Understanding of Exercise Prescription  Increase Physical  Activity;Able to understand and use rate of perceived exertion (RPE) scale;Knowledge and understanding of Target Heart Rate Range (THRR);Understanding of Exercise Prescription;Increase Strength and Stamina;Able to understand and use Dyspnea scale;Able to check pulse independently  Increase Physical Activity;Increase Strength and Stamina;Understanding of Exercise Prescription  Increase Physical Activity;Increase Strength and Stamina;Understanding of Exercise Prescription   Comments  Reviewed RPE scale, THR and program prescription with pt today.  Pt voiced understanding and was given a copy of goals to take home  [name] is off to a good start in rehab.  She is already up to 13 laps on the track.  She is not using weight due to her neck and spine, as they aggravated more so.  We will be reviewing home exercise guidelines soon.  We will continue to monitor her progress.   Reviewed home exercise with pt today.  Pt plans to walk and do PT exercises at home for exercise.  Reviewed THR, pulse, RPE, sign and symptoms, and when to call 911 or MD.  Also discussed weather considerations and indoor options.  Pt voiced understanding.  [name] has been doing well in rehab.  She is up to level 2 on BioStep.  She is ready to increase her T5 NuStep.  We will continue to monitor her progess.   [name] continues to do well in rehab.  She is moving better overall.  She is up to 17 laps on the track.  We will continue to monitor her progress.    Expected Outcomes  Short: Use RPE daily to regulate intensity. Long: Follow program prescription in THR.  Short: Review home exercise guidelines.  Long: Continue to follow program prescription.   Short: Start to add in walking at home.  Long: Continue to increase activity levels  Short: Increase T5 NuStep.  Long: Continue to increase strength and stamina.  Short: Continue to encourage more movement.  Long: Continue to increase strength and stamina.    [place] Name 09/23/18 1516              Exercise Goal Re-Evaluation   Exercise Goals Review  Increase Physical Activity;Increase Strength and Stamina;Understanding of Exercise Prescription       Comments  [name] has been doing well in rehab. She has tried her best to maintain.  She should be walking at home and has access to our videos as well. We will continue to monitor her progress at home.        Expected Outcomes  Short: Continue to move more at home.  Long: Continue to increase activity levels.           Discharge Exercise Prescription (Final Exercise Prescription Changes): Exercise Prescription Changes - 09/23/18 1500      Response to Exercise   Blood Pressure (Admit)  112/60    Blood Pressure (Exit)  104/62    Heart Rate (Admit)  80 bpm    Heart Rate (Exercise)  78 bpm    Heart Rate (Exit)  63 bpm    Oxygen Saturation (Admit)  93 %    Oxygen Saturation (Exercise)  86 %    Oxygen Saturation (Exit)  93 %    Rating of Perceived Exertion (Exercise)  15    Perceived Dyspnea (Exercise)  3    Symptoms  none    Duration  Continue with 45 min of aerobic exercise without signs/symptoms of physical distress.    Intensity  THRR unchanged      Progression   Progression  Continue to progress workloads to maintain intensity without signs/symptoms of physical distress.    Average METs  1.8      Resistance Training   Training Prescription  Yes    Weight  ROM    Reps  10-15      Interval Training   Interval Training  No      Oxygen   Oxygen  Continuous    Liters  3      T5 Nustep   Level  2    Minutes  15    METs  1.8      Biostep-RELP   Level  3    Minutes  15    METs  2      Track   Laps  13    Minutes  15    METs  1.6      Home Exercise Plan   Plans to continue exercise at  Home (comment)   walking, PT exercises   Frequency  Add 2 additional days to program exercise sessions.    Initial Home Exercises Provided  08/12/18       Nutrition:  Target Goals: Understanding of nutrition guidelines,  daily intake of sodium '1500mg'$ ,  cholesterol <267m, calories 30% from fat and 7% or less from saturated fats, daily to have 5 or more servings of fruits and vegetables.  Biometrics: Pre Biometrics - 07/21/18 1525      Pre Biometrics   Height  5' 1.6" (1.565 m)    Weight  144 lb 4.8 oz (65.5 kg)    Waist Circumference  36 inches    Hip Circumference  38 inches    Waist to Hip Ratio  0.95 %    BMI (Calculated)  26.72    Single Leg Stand  1.88 seconds        Nutrition Therapy Plan and Nutrition Goals: Nutrition Therapy & Goals - 09/09/18 1240      Nutrition Therapy   Fiber  20 grams    Whole Grain Foods  3 servings    Fruits and Vegetables  5 servings/day    Sodium  1.2 grams   Pt states per MD     Personal Nutrition Goals   Nutrition Goal  ST: have an easier time eating low sodium and enjoying it LT: get to down to sodium goal    Comments  Talked about how she is reading her labels and not eating foods she likes; talked about choosing lower salt foods and how to improve flavor. Showed pt how to read label using sample.       Intervention Plan   Intervention  Prescribe, educate and counsel regarding individualized specific dietary modifications aiming towards targeted core components such as weight, hypertension, lipid management, diabetes, heart failure and other comorbidities.    Expected Outcomes  Long Term Goal: Adherence to prescribed nutrition plan.;Short Term Goal: Understand basic principles of dietary content, such as calories, fat, sodium, cholesterol and nutrients.       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 She has an appetite, but has a hard time deciding what to eat. She tries to eat low sodium as much as she can.        Expected Outcome  Short: [name] make low sodium choices on a daily basis.  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 She has an appetite, but has a hard time deciding what to eat. She tries to eat low sodium as much as she can.     Expected Outcome  Short: [name] make low sodium choices on a daily basis. Long: [name] become indpendent with maintaining a heart healthy diet       Psychosocial: Target Goals: Acknowledge presence or absence of significant depression and/or stress, maximize coping skills, provide positive support system. Participant is able to verbalize types and ability to use techniques and skills needed for reducing stress and depression.   Initial Review & Psychosocial Screening: Initial Psych Review & Screening - 07/21/18 1511      Initial Review   Current issues with  Current Stress Concerns    Source of Stress Concerns  Chronic Illness;Family    Comments  She states her son stresses her out. She states that he tries to  be a doctor for her.      Family Dynamics   Good Support System?  Yes    Comments  She can look to her daugter for support who lives with her.      Barriers   Psychosocial barriers to participate in program  The [name] should benefit from training in stress management and relaxation.      Screening Interventions   Interventions  Encouraged to exercise;To provide support and resources with identified psychosocial needs;Program counselor consult;Provide feedback about the scores to participant    Expected Outcomes  Short Term goal: Utilizing psychosocial counselor, staff and physician to assist with identification of specific Stressors or current issues interfering with healing process. Setting desired goal for each stressor or current issue identified.;Long Term Goal: Stressors or  current issues are controlled or eliminated.;Short Term goal: Identification and review with participant of any Quality of Life or Depression concerns found by scoring the questionnaire.;Long Term goal: The participant improves quality of Life and PHQ9 Scores as seen by post scores and/or verbalization of changes       Quality of Life Scores:  Scores of 19 and below usually indicate a poorer quality of life in these areas.  A difference of  2-3 points is a clinically meaningful difference.  A difference of 2-3 points in the total score of the Quality of Life Index has been associated with significant improvement in overall quality of life, self-image, physical symptoms, and general health in studies assessing change in quality of life.  PHQ-9: Recent Review Flowsheet Data    Depression screen [name] 2/9 09/09/2018 08/12/2018 07/27/2018 07/21/2018 10/27/2017   Decreased Interest 0 1 0 1 0   Down, Depressed, Hopeless 1 1 0 2 0   PHQ - 2 Score 1 2 0 3 0   Altered sleeping _0 -   Tired, decreased energy _1 -   Change in appetite _2 -   Feeling bad or failure about yourself  0 0 1 2 -   Trouble concentrating _3 -   Moving slowly or fidgety/restless 0 0 1 1 -   Suicidal thoughts 0 0 0 0 -   PHQ-9 Score _4 -   Difficult doing work/chores Somewhat difficult Somewhat difficult Somewhat difficult Very difficult -     Interpretation of Total Score  Total Score Depression Severity:  1-4 = Minimal depression, 5-9 = Mild depression, 10-14 = Moderate depression, 15-19 = Moderately severe depression, 20-27 = Severe depression   Psychosocial Evaluation and Intervention: Psychosocial Evaluation - 07/27/18 1230      Psychosocial Evaluation & Interventions   Interventions  Stress management education;Encouraged to exercise with the program and follow exercise prescription    Comments  Ms. Ilyas [name]) has returned to this program after several years of being out.  She reports  having recent back surgery and a heart valve replaced and has chronic back pain.  She has a strong support system with a son and daughter and lots of friends and neighbors.  She also has a private home health agency who comes daily in the mornings and helps her.  She reports sleeping "terrible" with about 4 hours/night and her appetite was good until recently.  She denies a history of depression or anxiety but states she has stress with her health and her adult children insisting that she do certain things like attending this program.  She has goals to "  get my kids off my back!"  Staff will follow with her.    Expected Outcomes  Short:  [name] will exercise for her health and for stress management.  Long:  She will learn ways to sleep better and manage her stress in more positive ways.     Continue Psychosocial Services   Follow up required by staff       Psychosocial Re-Evaluation: Psychosocial Re-Evaluation    [name] Name 08/12/18 1445 09/07/18 1218 09/09/18 1415         Psychosocial Re-Evaluation   Current issues with  Current Stress Concerns  Current Sleep Concerns  Current Sleep Concerns     Comments  Reviewed [name] health questionnaire (PHQ-9) with [name] for follow up. Previously, patients score indicated signs/symptoms of depression.  Reviewed to see if [name] is improving symptom wise while in program.  Score improved and [name] states that it is because she is able to exercise and condition herself. Her Sleep is still lacking and finds it hard to stay asleep.  Counselor followed up with [name] regarding program engagement and perceptions.  [name] reported that sleep continues to be difficult as pain in her shoulders wakes her up.  She reported that medicine helps; she feels that exercise contributes to shoulder pain and said that she sleeps better on the weekends.    Reviewed [name] health questionnaire (PHQ-9) with [name] for follow up. Previously, patients score indicated  signs/symptoms of depression.  Reviewed to see if [name] is improving symptom wise while in program.  Score improved and [name] states that it is because she has been able to come and exercise and meet new people. She states that she is not sleeping well and has never slept well.     Expected Outcomes  Short: Continue to attend [facility] regularly for regular exercise and social engagement. Long: Continue to improve symptoms and manage a positive mental state.  Short term goals: to continue to exercise regularly; long term goals: for exercise to not impact sleep patterns  Short: Continue to attend [facility] regularly for regular exercise and social engagement. Long: Continue to improve symptoms and manage a positive mental state.     Interventions  Encouraged to attend [facility] for the exercise  -  Encouraged to attend [facility] for the exercise     Continue Psychosocial Services   Follow up required by staff  Follow up required by staff  Follow up required by staff        Psychosocial Discharge (Final Psychosocial Re-Evaluation): Psychosocial Re-Evaluation - 09/09/18 1415      Psychosocial Re-Evaluation   Current issues with  Current Sleep Concerns    Comments  Reviewed [name] health questionnaire (PHQ-9) with [name] for follow up. Previously, patients score indicated signs/symptoms of depression.  Reviewed to see if [name] is improving symptom wise while in program.  Score improved and [name] states that it is because she has been able to come and exercise and meet new people. She states that she is not sleeping well and has never slept well.    Expected Outcomes  Short: Continue to attend [facility] regularly for regular exercise and social engagement. Long: Continue to improve symptoms and manage a positive mental state.    Interventions  Encouraged to attend [facility] for the exercise    Continue Psychosocial Services   Follow up required by  staff       Education: Education Goals: Education classes will be provided on a weekly basis, covering required topics.  Participant will state understanding/return demonstration of topics presented.  Learning Barriers/Preferences: Learning Barriers/Preferences - 07/21/18 1516      Learning Barriers/Preferences   Learning Barriers  Sight   wears glasses   Learning Preferences  None       Education Topics:  Initial Evaluation Education: - Verbal, written and demonstration of respiratory meds, oximetry and breathing techniques. Instruction on use of nebulizers and MDIs and importance of monitoring MDI activations.   Pulmonary Rehab from 09/09/2018 in [facility] Cardiac and Pulmonary Rehab  Date  07/21/18  Educator  [facility]  Instruction Review Code  1- Verbalizes Understanding      General Nutrition Guidelines/Fats and Fiber: -Group instruction provided by verbal, written material, models and posters to present the general guidelines for heart healthy nutrition. Gives an explanation and review of dietary fats and fiber.   Pulmonary Rehab from 09/09/2018 in [facility] Cardiac and Pulmonary Rehab  Date  08/12/18  Educator  [facility]  Instruction Review Code  1- Verbalizes Understanding      Controlling Sodium/Reading Food Labels: -Group verbal and written material supporting the discussion of sodium use in heart healthy nutrition. Review and explanation with models, verbal and written materials for utilization of the food label.   Pulmonary Rehab from 09/09/2018 in [name] Cardiac and Pulmonary Rehab  Date  08/19/18  Educator  [facility]  Instruction Review Code  1- Verbalizes Understanding      Exercise Physiology & General Exercise Guidelines: - Group verbal and written instruction with models to review the exercise physiology of the cardiovascular system and associated critical values. Provides general exercise guidelines with specific guidelines to those with heart or lung disease.    Cardiac Rehab from  02/14/2016 in [facility] Cardiac and Pulmonary Rehab  Date  02/05/16  Educator  [facility]  Instruction Review Code (retired)  2- meets goals/outcomes      Aerobic Exercise & Resistance Training: - Gives group verbal and written instruction on the various components of exercise. Focuses on aerobic and resistive training programs and the benefits of this training and how to safely progress through these programs.   Cardiac Rehab from 02/14/2016 in [facility] Cardiac and Pulmonary Rehab  Date  02/07/16  Educator  [facility]lach[facility]  Instruction Review Code (retired)  2- Statistician, Balance, Mind/Body Relaxation: Provides group verbal/written instruction on the benefits of flexibility and balance training, including mind/body exercise modes such as yoga, pilates and tai chi.  Demonstration and skill practice provided.   Pulmonary Rehab from 09/09/2018 in [facility] Cardiac and Pulmonary Rehab  Date  07/29/18  Educator  AS  Instruction Review Code  1- Verbalizes Understanding      Stress and Anxiety: - Provides group verbal and written instruction about the health risks of elevated stress and causes of high stress.  Discuss the correlation between heart/lung disease and anxiety and treatment options. Review healthy ways to manage with stress and anxiety.   Pulmonary Rehab from 09/09/2018 in [facility] Cardiac and Pulmonary Rehab  Date  08/05/18  Educator  [facility]  Instruction Review Code  1- Verbalizes Understanding      Depression: - Provides group verbal and written instruction on the correlation between heart/lung disease and depressed mood, treatment options, and the stigmas associated with seeking treatment.   Pulmonary Rehab from 09/09/2018 in [facility] Cardiac and Pulmonary Rehab  Date  09/02/18  Educator  KG  Instruction Review Code  1- Verbalizes Understanding      Exercise & Equipment Safety: -  Individual verbal instruction and demonstration of equipment use and safety with use of the  equipment.   Pulmonary Rehab from 09/09/2018 in [facility] Cardiac and Pulmonary Rehab  Date  07/21/18  Educator  [facility]  Instruction Review Code  1- Verbalizes Understanding      Infection Prevention: - Provides verbal and written material to individual with discussion of infection control including proper hand washing and proper equipment cleaning during exercise session.   Pulmonary Rehab from 09/09/2018 in [facility] Cardiac and Pulmonary Rehab  Date  07/21/18  Educator  [facility]  Instruction Review Code  1- Verbalizes Understanding      Falls Prevention: - Provides verbal and written material to individual with discussion of falls prevention and safety.   Pulmonary Rehab from 09/09/2018 in [facility] Cardiac and Pulmonary Rehab  Date  07/21/18  Educator  [facility]  Instruction Review Code  1- Verbalizes Understanding      Diabetes: - Individual verbal and written instruction to review signs/symptoms of diabetes, desired ranges of glucose level fasting, after meals and with exercise. Advice that pre and post exercise glucose checks will be done for 3 sessions at entry of program.   Chronic Lung Diseases: - Group verbal and written instruction to review updates, respiratory medications, advancements in procedures and treatments. Discuss use of supplemental oxygen including available portable oxygen systems, continuous and intermittent flow rates, concentrators, personal use and safety guidelines. Review proper use of inhaler and spacers. Provide informative websites for self-education.    Pulmonary Rehab from 09/09/2018 in [facility] Cardiac and Pulmonary Rehab  Date  08/07/18  Educator  [facility]  Instruction Review Code  1- Verbalizes Understanding      Energy Conservation: - Provide group verbal and written instruction for methods to conserve energy, plan and organize activities. Instruct on pacing techniques, use of adaptive equipment and posture/positioning to relieve shortness of breath.   Pulmonary Rehab from  09/09/2018 in [facility] Cardiac and Pulmonary Rehab  Date  09/04/18  Educator  [facility]  Instruction Review Code  1- Verbalizes Understanding      Triggers and Exacerbations: - Group verbal and written instruction to review types of environmental triggers and ways to prevent exacerbations. Discuss weather changes, air quality and the benefits of nasal washing. Review warning signs and symptoms to help prevent infections. Discuss techniques for effective airway clearance, coughing, and vibrations.   AED/CPR: - Group verbal and written instruction with the use of models to demonstrate the basic use of the AED with the basic ABC's of resuscitation.   Anatomy and Physiology of the Lungs: - Group verbal and written instruction with the use of models to provide basic lung anatomy and physiology related to function, structure and complications of lung disease.   Pulmonary Rehab from 09/09/2018 in [facility] Cardiac and Pulmonary Rehab  Date  09/09/18  Educator  [facility]  Instruction Review Code  1- Verbalizes Understanding      Anatomy & Physiology of the Heart: - Group verbal and written instruction and models provide basic cardiac anatomy and physiology, with the coronary electrical and arterial systems. Review of Valvular disease and Heart Failure   Cardiac Rehab from 02/14/2016 in [facility] Cardiac and Pulmonary Rehab  Date  12/25/15  Educator  [facility]  Instruction Review Code (retired)  2- meets goals/outcomes      Cardiac Medications: - Group verbal and written instruction to review commonly prescribed medications for heart disease. Reviews the medication, class of the drug, and side effects.   Cardiac Rehab from 02/14/2016 in  [place] Cardiac and Pulmonary Rehab  Date  01/15/16 Marisue Humble 2]  Educator  [facility]  Instruction Review Code (retired)  2- meets goals/outcomes      Know Your Numbers and Risk Factors: -Group verbal and written instruction about important numbers in your health.  Discussion of what are risk factors  and how they play a role in the disease process.  Review of Cholesterol, Blood Pressure, Diabetes, and BMI and the role they play in your overall health.   Pulmonary Rehab from 09/09/2018 in [facility] Cardiac and Pulmonary Rehab  Date  08/26/18  Educator  [facility]  Instruction Review Code  1- Verbalizes Understanding      Sleep Hygiene: -Provides group verbal and written instruction about how sleep can affect your health.  Define sleep hygiene, discuss sleep cycles and impact of sleep habits. Review good sleep hygiene tips.    Other: -Provides group and verbal instruction on various topics (see comments)    Knowledge Questionnaire Score: Knowledge Questionnaire Score - 07/21/18 1450      Knowledge Questionnaire Score   Pre Score  12/18   reviewed with [name]       Core Components/Risk Factors/[name] Goals at Admission: Personal Goals and Risk Factors at Admission - 07/21/18 1516      Core Components/Risk Factors/[name] Goals on Admission    Weight Management  Yes;Weight Maintenance    Intervention  Weight Management: Develop a combined nutrition and exercise program designed to reach desired caloric intake, while maintaining appropriate intake of nutrient and fiber, sodium and fats, and appropriate energy expenditure required for the weight goal.;Weight Management: Provide education and appropriate resources to help participant work on and attain dietary goals.    Admit Weight  144 lb (65.3 kg)    Goal Weight: Short Term  140 lb (63.5 kg)    Goal Weight: Long Term  140 lb (63.5 kg)    Expected Outcomes  Short Term: Continue to assess and modify interventions until short term weight is achieved;Long Term: Adherence to nutrition and physical activity/exercise program aimed toward attainment of established weight goal;Weight Maintenance: Understanding of the daily nutrition guidelines, which includes 25-35% calories from fat, 7% or less cal from saturated fats, less than '200mg'$  cholesterol,  less than 1.5gm of sodium, & 5 or more servings of fruits and vegetables daily;Understanding recommendations for meals to include 15-35% energy as protein, 25-35% energy from fat, 35-60% energy from carbohydrates, less than '200mg'$  of dietary cholesterol, 20-35 gm of total fiber daily;Understanding of distribution of calorie intake throughout the day with the consumption of 4-5 meals/snacks    Improve shortness of breath with ADL's  Yes    Intervention  Provide education, individualized exercise plan and daily activity instruction to help decrease symptoms of SOB with activities of daily living.    Expected Outcomes  Short Term: Improve cardiorespiratory fitness to achieve a reduction of symptoms when performing ADLs;Long Term: Be able to perform more ADLs without symptoms or delay the onset of symptoms    Heart Failure  Yes    Intervention  Provide a combined exercise and nutrition program that is supplemented with education, support and counseling about heart failure. Directed toward relieving symptoms such as shortness of breath, decreased exercise tolerance, and extremity edema.    Expected Outcomes  Improve functional capacity of life;Short term: Attendance in program 2-3 days a week with increased exercise capacity. Reported lower sodium intake. Reported increased fruit and vegetable intake. Reports medication compliance.;Short term: Daily weights obtained and reported for increase.  Utilizing diuretic protocols set by physician.;Long term: Adoption of self-care skills and reduction of barriers for early signs and symptoms recognition and intervention leading to self-care maintenance.       Core Components/Risk Factors/[name] Goals Review:  Goals and Risk Factor Review    Row Name 08/28/18 1219             Core Components/Risk Factors/[name] Goals Review   Personal Goals Review  Weight Management/Obesity;Improve shortness of breath with ADL's;Heart Failure;Develop more efficient breathing  techniques such as purse lipped breathing and diaphragmatic breathing and practicing self-pacing with activity.       Review  Kelaiah is continuing to weight herself daily and keep a log. She is managing her heart failure symptoms well. She has had a productive cough mostly in the evenings, she has made her MDs aware and they tell her its due to age. She has noticed an improvement with her SOB since starting. She wants to keep increasing her strength while here as she can already tell a difference standing up straight with her breathing and her daily activities       Expected Outcomes  Short: Rimsha will continue to come to LW to work on strength and breathing with ADLs. Long: Jesslyn will maintain her strength and stand up right while doing activities and sitting.           Core Components/Risk Factors/[name] Goals at Discharge (Final Review):  Goals and Risk Factor Review - 08/28/18 1219      Core Components/Risk Factors/[name] Goals Review   Personal Goals Review  Weight Management/Obesity;Improve shortness of breath with ADL's;Heart Failure;Develop more efficient breathing techniques such as purse lipped breathing and diaphragmatic breathing and practicing self-pacing with activity.    Review  Brinlee is continuing to weight herself daily and keep a log. She is managing her heart failure symptoms well. She has had a productive cough mostly in the evenings, she has made her MDs aware and they tell her its due to age. She has noticed an improvement with her SOB since starting. She wants to keep increasing her strength while here as she can already tell a difference standing up straight with her breathing and her daily activities    Expected Outcomes  Short: Anneke will continue to come to LW to work on strength and breathing with ADLs. Long: Poonam will maintain her strength and stand up right while doing activities and sitting.        ITP Comments: ITP Comments    Row Name 07/21/18  1429 08/10/18 0843 09/07/18 0825 09/21/18 0936 10/05/18 0942   ITP Comments  Medical Evaluation completed. Chart sent for review and changes to Dr. [name] Director of [place]. Diagnosis can be found in [facility] encounter 07/09/2018  30 day review completed. ITP sent to Dr. [name] Director of [facility]. Continue with ITP unless changes are made by physician.  30 day review completed. ITP sent to Dr. [name] Director of [facility]. Continue with ITP unless changes are made by physician.  Our program is currently closed due to COVID-19.  We are communicating with [name] via phone calls and emails.  30 day review completed. ITP sent to Dr. [name] for review,changes as needed and signature. Continue with ITP unless changes directed by Dr. Sabra Heck.       Comments:

## 2018-10-12 ENCOUNTER — Encounter: Payer: Self-pay | Admitting: *Deleted

## 2018-10-12 DIAGNOSIS — J9611 Chronic respiratory failure with hypoxia: Secondary | ICD-10-CM

## 2018-10-12 DIAGNOSIS — Z9981 Dependence on supplemental oxygen: Principal | ICD-10-CM

## 2018-10-13 ENCOUNTER — Ambulatory Visit: Payer: Medicare Other | Admitting: Cardiovascular Disease

## 2018-10-14 ENCOUNTER — Other Ambulatory Visit: Payer: Self-pay | Admitting: Cardiovascular Disease

## 2018-10-26 ENCOUNTER — Telehealth: Payer: Self-pay

## 2018-10-26 ENCOUNTER — Ambulatory Visit (INDEPENDENT_AMBULATORY_CARE_PROVIDER_SITE_OTHER): Payer: Medicare Other | Admitting: Vascular Surgery

## 2018-10-26 NOTE — Telephone Encounter (Signed)
Attempted to contact pt to screen for COVID19 prior to INR check on 10/28/18. No answer, no vm on home phone. Left message on [name]'s vm advising that I will be in front of the [place] entrance of [facility], for pt to pull up to valet parking and I will come out to check her INR in her car. Asked her to call back if pt has a fever, has travelled since her last appt or has been in contact w/ anyone w/ COVID19.

## 2018-10-28 ENCOUNTER — Ambulatory Visit (INDEPENDENT_AMBULATORY_CARE_PROVIDER_SITE_OTHER): Payer: Medicare Other

## 2018-10-28 ENCOUNTER — Other Ambulatory Visit: Payer: Self-pay

## 2018-10-28 DIAGNOSIS — Z952 Presence of prosthetic heart valve: Secondary | ICD-10-CM | POA: Diagnosis not present

## 2018-10-28 DIAGNOSIS — I480 Paroxysmal atrial fibrillation: Secondary | ICD-10-CM

## 2018-10-28 DIAGNOSIS — Z5181 Encounter for therapeutic drug level monitoring: Secondary | ICD-10-CM | POA: Diagnosis not present

## 2018-10-28 LAB — POCT INR: INR: 5.3 — AB (ref 2.0–3.0)

## 2018-10-28 NOTE — Patient Instructions (Signed)
Please skip coumadin tonight, tomorrow & Friday, then resume dosage of 3mg  (1/2 tablet) daily, EXCEPT WHOLE PILL ON MONDAYS, [place]. Recheck in 3 weeks.

## 2018-11-02 ENCOUNTER — Telehealth: Payer: Self-pay | Admitting: Family

## 2018-11-02 ENCOUNTER — Other Ambulatory Visit: Payer: Self-pay | Admitting: Family

## 2018-11-02 DIAGNOSIS — I5023 Acute on chronic systolic (congestive) heart failure: Secondary | ICD-10-CM

## 2018-11-02 NOTE — Telephone Encounter (Deleted)
3

## 2018-11-02 NOTE — Telephone Encounter (Addendum)
Patient's daughter, Delorse Lek, called to say that patient has been retaining fluid and her weight has been rising. She's also becoming more swollen in her legs and is concerned about coming to outpatient offices. Patient's PCP has been adjusting diuretics but symptoms are continuing.   Will schedule patient for outpatient IV lasix, hopefully for tomorrow, and patient's daughter was comfortable with this plan. Will call [name] once it's been scheduled.

## 2018-11-03 ENCOUNTER — Ambulatory Visit
Admission: RE | Admit: 2018-11-03 | Discharge: 2018-11-03 | Disposition: A | Discharge status: Home / Self Care | Payer: Medicare Other | Source: Ambulatory Visit | Attending: Family | Admitting: Family

## 2018-11-03 ENCOUNTER — Encounter: Payer: Self-pay | Admitting: *Deleted

## 2018-11-03 ENCOUNTER — Other Ambulatory Visit: Payer: Self-pay

## 2018-11-03 DIAGNOSIS — I5023 Acute on chronic systolic (congestive) heart failure: Secondary | ICD-10-CM | POA: Insufficient documentation

## 2018-11-03 DIAGNOSIS — I130 Hypertensive heart and chronic kidney disease with heart failure and stage 1 through stage 4 chronic kidney disease, or unspecified chronic kidney disease: Secondary | ICD-10-CM | POA: Diagnosis not present

## 2018-11-03 DIAGNOSIS — J9611 Chronic respiratory failure with hypoxia: Secondary | ICD-10-CM

## 2018-11-03 DIAGNOSIS — R0602 Shortness of breath: Secondary | ICD-10-CM | POA: Diagnosis not present

## 2018-11-03 DIAGNOSIS — Z9981 Dependence on supplemental oxygen: Principal | ICD-10-CM

## 2018-11-03 LAB — BASIC METABOLIC PANEL
Anion gap: 17 — ABNORMAL HIGH (ref 5–15)
BUN: 107 mg/dL — ABNORMAL HIGH (ref 8–23)
CO2: 22 mmol/L (ref 22–32)
Calcium: 9.1 mg/dL (ref 8.9–10.3)
Chloride: 90 mmol/L — ABNORMAL LOW (ref 98–111)
Creatinine, Ser: 2.92 mg/dL — ABNORMAL HIGH (ref 0.44–1.00)
GFR calc Af Amer: 17 mL/min — ABNORMAL LOW (ref >60)
GFR calc non Af Amer: 14 mL/min — ABNORMAL LOW (ref >60)
Glucose, Bld: 110 mg/dL — ABNORMAL HIGH (ref 70–99)
Potassium: 3.9 mmol/L (ref 3.5–5.1)
Sodium: 129 mmol/L — ABNORMAL LOW (ref 135–145)

## 2018-11-03 LAB — BRAIN NATRIURETIC PEPTIDE: B Natriuretic Peptide: 496 pg/mL — ABNORMAL HIGH (ref 0.0–100.0)

## 2018-11-03 MED ORDER — FUROSEMIDE 10 MG/ML IJ SOLN
INTRAMUSCULAR | Status: AC
  Administered 2018-11-03: 14:00:00 80 mg via INTRAVENOUS
  Filled 2018-11-03: qty 8

## 2018-11-03 MED ORDER — POTASSIUM CHLORIDE CRYS ER 20 MEQ PO TBCR
EXTENDED_RELEASE_TABLET | ORAL | Status: AC
  Administered 2018-11-03: 14:00:00 40 meq via ORAL
  Filled 2018-11-03: qty 2

## 2018-11-03 MED ORDER — FUROSEMIDE 10 MG/ML IJ SOLN
80.0000 mg | Freq: Once | INTRAMUSCULAR | Status: AC
  Administered 2018-11-03: 14:00:00 80 mg via INTRAVENOUS

## 2018-11-03 MED ORDER — SODIUM CHLORIDE FLUSH 0.9 % IV SOLN
INTRAVENOUS | Status: AC
  Filled 2018-11-03: qty 20

## 2018-11-03 MED ORDER — POTASSIUM CHLORIDE CRYS ER 20 MEQ PO TBCR
40.0000 meq | EXTENDED_RELEASE_TABLET | Freq: Once | ORAL | Status: AC
  Administered 2018-11-03: 14:00:00 40 meq via ORAL

## 2018-11-04 ENCOUNTER — Other Ambulatory Visit: Payer: Self-pay

## 2018-11-04 ENCOUNTER — Emergency Department: Payer: Medicare Other

## 2018-11-04 ENCOUNTER — Telehealth: Payer: Self-pay | Admitting: Family

## 2018-11-04 ENCOUNTER — Encounter: Payer: Self-pay | Admitting: Emergency Medicine

## 2018-11-04 ENCOUNTER — Inpatient Hospital Stay
Admission: EM | Admit: 2018-11-04 | Discharge: 2018-11-08 | DRG: 291 | Disposition: A | Discharge status: Home Health | Payer: Medicare Other | Attending: Family Medicine | Admitting: Family Medicine

## 2018-11-04 DIAGNOSIS — Z20828 Contact with and (suspected) exposure to other viral communicable diseases: Secondary | ICD-10-CM | POA: Diagnosis present

## 2018-11-04 DIAGNOSIS — I5042 Chronic combined systolic (congestive) and diastolic (congestive) heart failure: Secondary | ICD-10-CM

## 2018-11-04 DIAGNOSIS — Z87891 Personal history of nicotine dependence: Secondary | ICD-10-CM

## 2018-11-04 DIAGNOSIS — I361 Nonrheumatic tricuspid (valve) insufficiency: Secondary | ICD-10-CM | POA: Diagnosis not present

## 2018-11-04 DIAGNOSIS — Z7901 Long term (current) use of anticoagulants: Secondary | ICD-10-CM | POA: Diagnosis not present

## 2018-11-04 DIAGNOSIS — E782 Mixed hyperlipidemia: Secondary | ICD-10-CM | POA: Diagnosis present

## 2018-11-04 DIAGNOSIS — I5043 Acute on chronic combined systolic (congestive) and diastolic (congestive) heart failure: Secondary | ICD-10-CM

## 2018-11-04 DIAGNOSIS — I482 Chronic atrial fibrillation, unspecified: Secondary | ICD-10-CM | POA: Diagnosis present

## 2018-11-04 DIAGNOSIS — N179 Acute kidney failure, unspecified: Secondary | ICD-10-CM | POA: Diagnosis present

## 2018-11-04 DIAGNOSIS — G8929 Other chronic pain: Secondary | ICD-10-CM | POA: Diagnosis present

## 2018-11-04 DIAGNOSIS — K219 Gastro-esophageal reflux disease without esophagitis: Secondary | ICD-10-CM | POA: Diagnosis present

## 2018-11-04 DIAGNOSIS — I272 Pulmonary hypertension, unspecified: Secondary | ICD-10-CM

## 2018-11-04 DIAGNOSIS — T502X5A Adverse effect of carbonic-anhydrase inhibitors, benzothiadiazides and other diuretics, initial encounter: Secondary | ICD-10-CM | POA: Diagnosis present

## 2018-11-04 DIAGNOSIS — Z5320 Procedure and treatment not carried out because of patient's decision for unspecified reasons: Secondary | ICD-10-CM | POA: Diagnosis present

## 2018-11-04 DIAGNOSIS — I509 Heart failure, unspecified: Secondary | ICD-10-CM

## 2018-11-04 DIAGNOSIS — I480 Paroxysmal atrial fibrillation: Secondary | ICD-10-CM | POA: Diagnosis present

## 2018-11-04 DIAGNOSIS — I959 Hypotension, unspecified: Secondary | ICD-10-CM | POA: Diagnosis present

## 2018-11-04 DIAGNOSIS — R600 Localized edema: Secondary | ICD-10-CM | POA: Diagnosis not present

## 2018-11-04 DIAGNOSIS — Z841 Family history of disorders of kidney and ureter: Secondary | ICD-10-CM

## 2018-11-04 DIAGNOSIS — Z9981 Dependence on supplemental oxygen: Secondary | ICD-10-CM

## 2018-11-04 DIAGNOSIS — R0602 Shortness of breath: Secondary | ICD-10-CM

## 2018-11-04 DIAGNOSIS — Z823 Family history of stroke: Secondary | ICD-10-CM

## 2018-11-04 DIAGNOSIS — Z881 Allergy status to other antibiotic agents status: Secondary | ICD-10-CM

## 2018-11-04 DIAGNOSIS — J449 Chronic obstructive pulmonary disease, unspecified: Secondary | ICD-10-CM | POA: Diagnosis present

## 2018-11-04 DIAGNOSIS — Z888 Allergy status to other drugs, medicaments and biological substances status: Secondary | ICD-10-CM

## 2018-11-04 DIAGNOSIS — M150 Primary generalized (osteo)arthritis: Secondary | ICD-10-CM | POA: Diagnosis present

## 2018-11-04 DIAGNOSIS — I872 Venous insufficiency (chronic) (peripheral): Secondary | ICD-10-CM | POA: Diagnosis present

## 2018-11-04 DIAGNOSIS — I130 Hypertensive heart and chronic kidney disease with heart failure and stage 1 through stage 4 chronic kidney disease, or unspecified chronic kidney disease: Secondary | ICD-10-CM | POA: Diagnosis present

## 2018-11-04 DIAGNOSIS — Z950 Presence of cardiac pacemaker: Secondary | ICD-10-CM

## 2018-11-04 DIAGNOSIS — I483 Typical atrial flutter: Secondary | ICD-10-CM | POA: Diagnosis present

## 2018-11-04 DIAGNOSIS — I890 Lymphedema, not elsewhere classified: Secondary | ICD-10-CM | POA: Diagnosis present

## 2018-11-04 DIAGNOSIS — Z952 Presence of prosthetic heart valve: Secondary | ICD-10-CM

## 2018-11-04 DIAGNOSIS — Z79899 Other long term (current) drug therapy: Secondary | ICD-10-CM

## 2018-11-04 DIAGNOSIS — N184 Chronic kidney disease, stage 4 (severe): Secondary | ICD-10-CM | POA: Diagnosis present

## 2018-11-04 DIAGNOSIS — I5022 Chronic systolic (congestive) heart failure: Secondary | ICD-10-CM | POA: Diagnosis present

## 2018-11-04 DIAGNOSIS — Z880 Allergy status to penicillin: Secondary | ICD-10-CM

## 2018-11-04 DIAGNOSIS — E871 Hypo-osmolality and hyponatremia: Secondary | ICD-10-CM | POA: Diagnosis present

## 2018-11-04 DIAGNOSIS — Z885 Allergy status to narcotic agent status: Secondary | ICD-10-CM

## 2018-11-04 DIAGNOSIS — I082 Rheumatic disorders of both aortic and tricuspid valves: Secondary | ICD-10-CM | POA: Diagnosis present

## 2018-11-04 DIAGNOSIS — Z7982 Long term (current) use of aspirin: Secondary | ICD-10-CM

## 2018-11-04 DIAGNOSIS — I471 Supraventricular tachycardia: Secondary | ICD-10-CM | POA: Diagnosis present

## 2018-11-04 DIAGNOSIS — D649 Anemia, unspecified: Secondary | ICD-10-CM | POA: Diagnosis present

## 2018-11-04 DIAGNOSIS — N189 Chronic kidney disease, unspecified: Principal | ICD-10-CM

## 2018-11-04 LAB — BRAIN NATRIURETIC PEPTIDE: B Natriuretic Peptide: 505 pg/mL — ABNORMAL HIGH (ref 0.0–100.0)

## 2018-11-04 LAB — PROTIME-INR
INR: 2.1 — ABNORMAL HIGH (ref 0.8–1.2)
Prothrombin Time: 23.5 seconds — ABNORMAL HIGH (ref 11.4–15.2)

## 2018-11-04 LAB — CBC
HCT: 36.3 % (ref 36.0–46.0)
Hemoglobin: 11.3 g/dL — ABNORMAL LOW (ref 12.0–15.0)
MCH: 27.8 pg (ref 26.0–34.0)
MCHC: 31.1 g/dL (ref 30.0–36.0)
MCV: 89.4 fL (ref 80.0–100.0)
Platelets: 146 10*3/uL — ABNORMAL LOW (ref 150–400)
RBC: 4.06 MIL/uL (ref 3.87–5.11)
RDW: 18.1 % — ABNORMAL HIGH (ref 11.5–15.5)
WBC: 5.4 10*3/uL (ref 4.0–10.5)
nRBC: 0 % (ref 0.0–0.2)

## 2018-11-04 LAB — HEPATIC FUNCTION PANEL
ALT: 25 U/L (ref 0–44)
AST: 25 U/L (ref 15–41)
Albumin: 4.4 g/dL (ref 3.5–5.0)
Alkaline Phosphatase: 84 U/L (ref 38–126)
Bilirubin, Direct: 0.3 mg/dL — ABNORMAL HIGH (ref 0.0–0.2)
Indirect Bilirubin: 0.8 mg/dL (ref 0.3–0.9)
Total Bilirubin: 1.1 mg/dL (ref 0.3–1.2)
Total Protein: 7.1 g/dL (ref 6.5–8.1)

## 2018-11-04 LAB — BASIC METABOLIC PANEL
Anion gap: 12 (ref 5–15)
BUN: 113 mg/dL — ABNORMAL HIGH (ref 8–23)
CO2: 25 mmol/L (ref 22–32)
Calcium: 9.4 mg/dL (ref 8.9–10.3)
Chloride: 93 mmol/L — ABNORMAL LOW (ref 98–111)
Creatinine, Ser: 3.05 mg/dL — ABNORMAL HIGH (ref 0.44–1.00)
GFR calc Af Amer: 16 mL/min — ABNORMAL LOW (ref >60)
GFR calc non Af Amer: 14 mL/min — ABNORMAL LOW (ref >60)
Glucose, Bld: 100 mg/dL — ABNORMAL HIGH (ref 70–99)
Potassium: 4.6 mmol/L (ref 3.5–5.1)
Sodium: 130 mmol/L — ABNORMAL LOW (ref 135–145)

## 2018-11-04 LAB — ALBUMIN: Albumin: 4.3 g/dL (ref 3.5–5.0)

## 2018-11-04 LAB — SARS CORONAVIRUS 2 BY RT PCR (HOSPITAL ORDER, PERFORMED IN ~~LOC~~ HOSPITAL LAB): SARS Coronavirus 2: NEGATIVE

## 2018-11-04 LAB — TROPONIN I: Troponin I: 0.03 ng/mL (ref <0.03)

## 2018-11-04 LAB — APTT: aPTT: 46 seconds — ABNORMAL HIGH (ref 24–36)

## 2018-11-04 MED ORDER — AMIODARONE HCL 200 MG PO TABS
200.0000 mg | ORAL_TABLET | Freq: Every day | ORAL | Status: DC
  Administered 2018-11-05 – 2018-11-08 (×4): 200 mg via ORAL
  Filled 2018-11-04 (×4): qty 1

## 2018-11-04 MED ORDER — WARFARIN SODIUM 6 MG PO TABS
6.0000 mg | ORAL_TABLET | ORAL | Status: DC
  Administered 2018-11-04 – 2018-11-06 (×2): 6 mg via ORAL
  Filled 2018-11-04 (×2): qty 1

## 2018-11-04 MED ORDER — RISAQUAD PO CAPS
1.0000 | ORAL_CAPSULE | Freq: Every day | ORAL | Status: DC
  Administered 2018-11-06 – 2018-11-08 (×3): 1 via ORAL
  Filled 2018-11-04 (×5): qty 1

## 2018-11-04 MED ORDER — MENTHOL (TOPICAL ANALGESIC) 5 % EX PTCH: 1.0000 | MEDICATED_PATCH | Freq: Every day | CUTANEOUS | Status: DC | PRN

## 2018-11-04 MED ORDER — WARFARIN SODIUM 3 MG PO TABS
3.0000 mg | ORAL_TABLET | ORAL | Status: DC
  Administered 2018-11-05 – 2018-11-07 (×2): 3 mg via ORAL
  Filled 2018-11-04 (×3): qty 1

## 2018-11-04 MED ORDER — LOSARTAN POTASSIUM 25 MG PO TABS: 12.5000 mg | ORAL_TABLET | Freq: Every day | ORAL | Status: DC

## 2018-11-04 MED ORDER — ACETAMINOPHEN 325 MG PO TABS
650.0000 mg | ORAL_TABLET | ORAL | Status: DC | PRN
  Administered 2018-11-05 – 2018-11-07 (×11): 650 mg via ORAL
  Filled 2018-11-04 (×12): qty 2

## 2018-11-04 MED ORDER — POTASSIUM CHLORIDE CRYS ER 10 MEQ PO TBCR: 10.0000 meq | EXTENDED_RELEASE_TABLET | Freq: Two times a day (BID) | ORAL | Status: DC

## 2018-11-04 MED ORDER — SODIUM CHLORIDE 0.9% FLUSH
3.0000 mL | INTRAVENOUS | Status: DC | PRN
  Administered 2018-11-04: 3 mL via INTRAVENOUS
  Filled 2018-11-04: qty 3

## 2018-11-04 MED ORDER — WARFARIN SODIUM 3 MG PO TABS: 3.0000 mg | ORAL_TABLET | ORAL | Status: DC

## 2018-11-04 MED ORDER — SODIUM CHLORIDE 0.9 % IV SOLN
250.0000 mL | INTRAVENOUS | Status: DC | PRN
  Administered 2018-11-06: 250 mL via INTRAVENOUS

## 2018-11-04 MED ORDER — ORAL CARE MOUTH RINSE
15.0000 mL | Freq: Two times a day (BID) | OROMUCOSAL | Status: DC
  Administered 2018-11-04 – 2018-11-07 (×7): 15 mL via OROMUCOSAL

## 2018-11-04 MED ORDER — VITAMIN C 500 MG PO TABS
500.0000 mg | ORAL_TABLET | Freq: Every day | ORAL | Status: DC
  Administered 2018-11-05 – 2018-11-08 (×4): 500 mg via ORAL
  Filled 2018-11-04 (×4): qty 1

## 2018-11-04 MED ORDER — METOLAZONE 5 MG PO TABS
5.0000 mg | ORAL_TABLET | Freq: Every day | ORAL | Status: DC | PRN
  Administered 2018-11-06: 5 mg via ORAL
  Filled 2018-11-04 (×2): qty 1

## 2018-11-04 MED ORDER — VITAMIN D3 25 MCG (1000 UNIT) PO TABS
2000.0000 [IU] | ORAL_TABLET | Freq: Every day | ORAL | Status: DC
  Administered 2018-11-05 – 2018-11-08 (×4): 2000 [IU] via ORAL
  Filled 2018-11-04 (×4): qty 2

## 2018-11-04 MED ORDER — FUROSEMIDE 10 MG/ML IJ SOLN: 40.0000 mg | Freq: Every day | INTRAMUSCULAR | Status: DC

## 2018-11-04 MED ORDER — ONDANSETRON HCL 4 MG/2ML IJ SOLN: 4.0000 mg | Freq: Four times a day (QID) | INTRAMUSCULAR | Status: DC | PRN

## 2018-11-04 MED ORDER — SPIRONOLACTONE 25 MG PO TABS: 25.0000 mg | ORAL_TABLET | Freq: Every day | ORAL | Status: DC

## 2018-11-04 MED ORDER — WARFARIN - PHYSICIAN DOSING INPATIENT
Freq: Every day | Status: DC
  Administered 2018-11-04 – 2018-11-07 (×4)

## 2018-11-04 MED ORDER — ASPIRIN EC 81 MG PO TBEC
81.0000 mg | DELAYED_RELEASE_TABLET | Freq: Every day | ORAL | Status: DC
  Administered 2018-11-06 – 2018-11-08 (×3): 81 mg via ORAL
  Filled 2018-11-04 (×3): qty 1

## 2018-11-04 MED ORDER — ALPRAZOLAM 0.25 MG PO TABS
0.2500 mg | ORAL_TABLET | Freq: Every evening | ORAL | Status: DC | PRN
  Administered 2018-11-04: 0.25 mg via ORAL
  Filled 2018-11-04: qty 1

## 2018-11-04 MED ORDER — SODIUM CHLORIDE 0.9% FLUSH
3.0000 mL | Freq: Two times a day (BID) | INTRAVENOUS | Status: DC
  Administered 2018-11-04: 21:00:00 via INTRAVENOUS
  Administered 2018-11-05 – 2018-11-08 (×6): 3 mL via INTRAVENOUS

## 2018-11-04 NOTE — ED Notes (Signed)
Daughter states they have been working on getting fluid off for 4 weeks now with no change in condition.

## 2018-11-04 NOTE — H&P (Signed)
[facility] [place] [facility] NAME: [name]    MR#:  [id number]  DATE OF BIRTH:  10-May-1935  DATE OF ADMISSION:  11/04/2018  PRIMARY CARE PHYSICIAN: Rusty Aus, MD   REQUESTING/REFERRING PHYSICIAN:   CHIEF COMPLAINT:   Chief Complaint  Patient presents with  . Shortness of Breath  . Leg Swelling    HISTORY OF PRESENT ILLNESS: [name]  is a 84 y.o. female with a known history of chronic atrial fibrillation on Coumadin, combined systolic diastolic heart failure with EF of 25%, GERD, hypertension, lymphedema, mitral valve replacement on Coumadin presented to the emergency room for shortness of breath and swelling in the legs.  Patient has gained the weight for the last couple of days.  No complaints of any chest pain.  Patient was given IV Lasix for diuresis at the heart failure clinic.  And was being given diuretics and the dose has been adjusted in the outpatient clinic.  Renal function has been worsening and resistance to diuresis as outpatient.Hospitalist service is consulted for further care.  PAST MEDICAL HISTORY:   Past Medical History:  Diagnosis Date  . Arthritis    "back, hands, knees" (04/10/2016)  . Chronic atrial fibrillation    a. On Coumadin  . Chronic back pain   . 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 EF 25% by echo in 08/2015 b. RHC in 08/2015 showed normal filling pressures  . Chronic kidney disease   . Compression fracture    "several; all in my back" (04/10/2016)  . GERD (gastroesophageal reflux disease)   . Hypertension   . Lymphedema   . S/P MVR (mitral valve replacement)    a. MVR 1994 b. redo MVR in 08/2014 - on Coumadin  . Shortness of breath dyspnea     PAST SURGICAL HISTORY:  Past Surgical History:  Procedure Laterality Date  . BREAST LUMPECTOMY Right 2005?   benign lump excision  . CARDIAC CATHETERIZATION    . [place], 2016   "MVR; MVR"  . CATARACT EXTRACTION W/PHACO Right 02/20/2015   Procedure: CATARACT EXTRACTION PHACO AND INTRAOCULAR LENS PLACEMENT (IOC);  Surgeon: Estill Cotta, MD;  Location: [facility] ORS;  Service: Ophthalmology;  Laterality: Right;  US01:31.2 AP 25.3% CDE40.13 Fluid lot # [id number]  . ELECTROPHYSIOLOGIC STUDY N/A 10/09/2015   Procedure: CARDIOVERSION;  Surgeon: Wellington Hampshire, MD;  Location: [facility] ORS;  Service: Cardiovascular;  Laterality: N/A;  . IR GENERIC HISTORICAL  04/01/2016   IR RADIOLOGIST EVAL & MGMT 04/01/2016 [facility]  . IR GENERIC HISTORICAL  04/15/2016   IR VERTEBROPLASTY CERV/THOR BX INC UNI/BIL INC/INJECT/IMAGING 04/15/2016 Luanne Bras, MD [facility]  . IR GENERIC HISTORICAL  04/15/2016   IR VERTEBROPLASTY EA ADDL (T&LS) BX INC UNI/BIL INC INJECT/IMAGING 04/15/2016 Luanne Bras, MD [facility]  . IR GENERIC HISTORICAL  04/15/2016   IR VERTEBROPLASTY EA ADDL (T&LS) BX INC UNI/BIL INC INJECT/IMAGING 04/15/2016 Luanne Bras, MD [facility]  . IR GENERIC HISTORICAL  05/20/2016   IR RADIOLOGIST EVAL & MGMT 05/20/2016 [facility]  . IR GENERIC HISTORICAL  06/13/2016   IR VERTEBROPLASTY EA ADDL (T&LS) BX INC UNI/BIL INC INJECT/IMAGING 06/13/2016 Luanne Bras, MD [facility]  . IR GENERIC HISTORICAL  06/13/2016   IR SACROPLASTY BILATERAL 06/13/2016 Luanne Bras, MD [facility]  . IR GENERIC HISTORICAL  06/13/2016   IR VERTEBROPLASTY LUMBAR BX INC UNI/BIL INC/INJECT/IMAGING 06/13/2016 Luanne Bras, MD  [facility]  . IRRIGATION AND DEBRIDEMENT HEMATOMA Left 07/05/2015   Procedure: IRRIGATION AND DEBRIDEMENT HEMATOMA;  Surgeon: Robert Bellow, MD;  Location: [facility] ORS;  Service: General;  Laterality: Left;  . pyloric stenosis  07/1937  . US ECHOCARDIOGRAPHY      SOCIAL HISTORY:  Social History   Tobacco Use  . Smoking status: Former Smoker    Packs/day: 1.00    Years: 27.00    Pack years: 27.00     Types: Cigarettes    Last attempt to quit: 07/01/1980    Years since quitting: 38.3  . Smokeless tobacco: Never Used  . Tobacco comment: "quit smoking ~ 1980  Substance Use Topics  . Alcohol use: Not Currently    Alcohol/week: 0.0 standard drinks    Comment: 04/10/2016 "I'll have a drink on holidays/special occasions"    FAMILY HISTORY:  Family History  Problem Relation Age of Onset  . Stroke Mother   . Renal Disease Father     DRUG ALLERGIES:  Allergies  Allergen Reactions  . Ace Inhibitors     Cough  . Doxycycline Nausea Only  . Hydrocodone Itching and Nausea Only  . Mercury     Other reaction(s): Unknown  . Silver Dermatitis    Severe itching  . Cephalexin Rash  . Clindamycin/Lincomycin Rash  . Nickel Rash  . Penicillins Rash    Has patient had a PCN reaction causing immediate rash, facial/tongue/throat swelling, SOB or lightheadedness with hypotension: YES Has patient had a PCN reaction causing severe rash involving mucus membranes or skin necrosis: NO Has patient had a PCN reaction that required hospitalization NO Has patient had a PCN reaction occurring within the last 10 years: NO If all of the above answers are "NO", then may proceed with Cephalosporin use.    REVIEW OF SYSTEMS:   CONSTITUTIONAL: No fever, fatigue or weakness.  EYES: No blurred or double vision.  EARS, NOSE, AND THROAT: No tinnitus or ear pain.  RESPIRATORY: No cough, mild shortness of breath, no wheezing or hemoptysis.  CARDIOVASCULAR: No chest pain, has orthopnea, edema.  GASTROINTESTINAL: No nausea, vomiting, diarrhea or abdominal pain.  GENITOURINARY: No dysuria, hematuria.  ENDOCRINE: No polyuria, nocturia,  HEMATOLOGY: No anemia, easy bruising or bleeding SKIN: No rash or lesion. MUSCULOSKELETAL: No joint pain or arthritis.   NEUROLOGIC: No tingling, numbness, weakness.  PSYCHIATRY: No anxiety or depression.   MEDICATIONS AT HOME:  Prior to Admission medications   Medication Sig  Start Date End Date Taking? Authorizing Provider  acetaminophen (TYLENOL) 650 MG CR tablet Take 650 mg by mouth every 8 (eight) hours as needed for pain.   Yes [provider]  ALPRAZolam (XANAX) 0.25 MG tablet Take 0.25 mg by mouth at bedtime as needed for sleep. 10/27/18  Yes [provider]  amiodarone (PACERONE) 200 MG tablet Take 1 tablet (200 mg total) by mouth daily. 09/24/18  Yes Minna Merritts, MD  aspirin EC 81 MG tablet Take 81 mg by mouth daily.   Yes [provider]  Cholecalciferol (VITAMIN D) 2000 units tablet Take 2,000 Units by mouth daily.   Yes [provider]  KLOR-CON 10 10 MEQ tablet Take 10 mEq by mouth 2 (two) times daily.    Yes [provider]  LASIX 20 MG tablet TAKE ONE TABLET BY MOUTH 3 TIMES DAILY Patient taking differently: Take 20 mg by mouth 3 (three) times daily.  10/14/18  Yes Minna Merritts, MD  losartan (COZAAR) 25 MG tablet  TAKE 1/2 TABLET EVERY DAY Patient taking differently: Take 12.5 mg by mouth daily.  09/11/18  Yes Gollan, Kathlene November, MD  Menthol (ICY HOT) 5 % PTCH Apply 1 patch topically daily as needed (pain).   Yes [provider]  metolazone (ZAROXOLYN) 5 MG tablet Take 5 mg by mouth daily as needed (severe fluid retention).   Yes [provider]  Probiotic Product (RISA-BID PROBIOTIC PO) Take 1 capsule by mouth daily.    Yes [provider]  spironolactone (ALDACTONE) 25 MG tablet Take 25 mg by mouth daily.    Yes [provider]  vitamin C (ASCORBIC ACID) 500 MG tablet Take 500 mg by mouth daily.   Yes [provider]  warfarin (COUMADIN) 6 MG tablet Take 3-6 mg by mouth See admin instructions. Take 1 tablet (6mg ) by mouth every Monday, Wednesday, Friday evening and take  tablet (3mg ) by mouth every Tuesday, Thursday, Saturday and Sunday evening   Yes [provider]      PHYSICAL EXAMINATION:   VITAL SIGNS: Blood pressure 93/75, pulse (!) 106,  temperature 97.6 F (36.4 C), temperature source Oral, resp. rate 20, height 4\' 11"  (1.499 m), weight 67.2 kg, SpO2 100 %.  GENERAL:  84 y.o.-year-old patient lying in the bed with no acute distress.  EYES: Pupils equal, round, reactive to light and accommodation. No scleral icterus. Extraocular muscles intact.  HEENT: Head atraumatic, normocephalic. Oropharynx and nasopharynx clear.  NECK:  Supple, no jugular venous distention. No thyroid enlargement, no tenderness.  LUNGS: Decreased breath sounds bilaterally, bibasilar crepitations heard. No use of accessory muscles of respiration.  CARDIOVASCULAR: S1, S2 normal. No murmurs, rubs, or gallops.  ABDOMEN: Soft, nontender, nondistended. Bowel sounds present. No organomegaly or mass.  EXTREMITIES: Has pedal edema,  No cyanosis, or clubbing.  NEUROLOGIC: Cranial nerves II through XII are intact. Muscle strength 5/5 in all extremities. Sensation intact. Gait not checked.  PSYCHIATRIC: The patient is alert and oriented x 3.  SKIN: No obvious rash, lesion, or ulcer.   LABORATORY PANEL:   CBC Recent Labs  Lab 11/04/18 1141  WBC 5.4  HGB 11.3*  HCT 36.3  PLT 146*  MCV 89.4  MCH 27.8  MCHC 31.1  RDW 18.1*   ------------------------------------------------------------------------------------------------------------------  Chemistries  Recent Labs  Lab 11/03/18 1314 11/04/18 1141  NA 129* 130*  K 3.9 4.6  CL 90* 93*  CO2 22 25  GLUCOSE 110* 100*  BUN 107* 113*  CREATININE 2.92* 3.05*  CALCIUM 9.1 9.4   ------------------------------------------------------------------------------------------------------------------ estimated creatinine clearance is 12.1 mL/min (A) (by C-G formula based on SCr of 3.05 mg/dL (H)). ------------------------------------------------------------------------------------------------------------------ No results for input(s): TSH, T4TOTAL, T3FREE, THYROIDAB in the last 72 hours.  Invalid input(s):  FREET3   Coagulation profile Recent Labs  Lab 11/04/18 1141  INR 2.1*   ------------------------------------------------------------------------------------------------------------------- No results for input(s): DDIMER in the last 72 hours. -------------------------------------------------------------------------------------------------------------------  Cardiac Enzymes Recent Labs  Lab 11/04/18 1141  TROPONINI 0.03*   ------------------------------------------------------------------------------------------------------------------ Invalid input(s): POCBNP  ---------------------------------------------------------------------------------------------------------------  Urinalysis    Component Value Date/Time   COLORURINE YELLOW (A) 05/14/2016 2017   APPEARANCEUR CLEAR (A) 05/14/2016 2017   APPEARANCEUR Hazy 03/22/2012 1151   LABSPEC 1.018 05/14/2016 2017   LABSPEC 1.043 03/22/2012 1151   PHURINE 5.0 05/14/2016 2017   GLUCOSEU NEGATIVE 05/14/2016 2017   GLUCOSEU Negative 03/22/2012 1151   HGBUR NEGATIVE 05/14/2016 2017   BILIRUBINUR NEGATIVE 05/14/2016 2017   BILIRUBINUR Negative 03/22/2012 [place] NEGATIVE 05/14/2016 2017  PROTEINUR NEGATIVE 05/14/2016 2017   NITRITE NEGATIVE 05/14/2016 2017   LEUKOCYTESUR NEGATIVE 05/14/2016 2017   LEUKOCYTESUR Negative 03/22/2012 1151     RADIOLOGY: Dg Chest Port 1 View  Result Date: 11/04/2018 CLINICAL DATA:  Shortness of breath and bilateral lower extremity edema developing over the past few days. EXAM: PORTABLE CHEST 1 VIEW COMPARISON:  PA and lateral chest 10/10/2015. FINDINGS: The patient is status post mitral valve repair. There is cardiomegaly and pulmonary edema. No pneumothorax. Likely small pleural effusions noted. No acute bony abnormality. IMPRESSION: Cardiomegaly and interstitial edema. There are likely small bilateral pleural effusions. Electronically Signed   By: Inge Rise M.D.   On: 11/04/2018 12:47     EKG: Orders placed or performed in visit on 11/04/18  . EKG 12-Lead  . EKG 12-Lead    IMPRESSION AND PLAN: 84 yr old female patient with a known history of chronic atrial fibrillation on Coumadin, combined systolic diastolic heart failure with EF of 25%, GERD, hypertension, lymphedema, mitral valve replacement on Coumadin presented to the emergency room for shortness of breath and swelling in the legs.  Patient has gained the weight for the last couple of days.   -Acute on chronic systolic and diastolic heart failure exacerbation Diurese with lasix cautiously Daily input output chart Daily body weight Cardiology consult  Continue ARB, Aldactone Recheck echocardiogram  -Chronic atrial fibrillation Continue Coumadin for anticoagulation Continue oral amiodarone  -GERD : PPI  -DVT prophylaxis On anticoagulation with Coumadin  All the records are reviewed and case discussed with ED provider. Management plans discussed with the patient, family and they are in agreement.  CODE STATUS: Full code Code Status History    Date Active Date Inactive Code Status Order ID Comments User Context   04/10/2016 2034 04/19/2016 1757 Full Code [id number]  Vianne Bulls, MD Inpatient   09/29/2015 1053 10/10/2015 1406 Full Code [id number]  [name], MD Inpatient   04/28/2015 1626 04/30/2015 1422 Full Code [id number]  Demetrios Loll, MD Inpatient       TOTAL TIME TAKING CARE OF THIS PATIENT: 53 minutes.    Saundra Shelling M.D on 11/04/2018 at 2:25 PM  Between 7am to 6pm - Pager - [phone]  After 6pm go to [email or web address] - password [email or web address]  Office  [phone]  CC: Primary care physician; Rusty Aus, MD

## 2018-11-04 NOTE — ED Notes (Signed)
Patient's daughter updated on plan of care

## 2018-11-04 NOTE — ED Notes (Signed)
Patient transported to 2A in stable condition, daughter [name] notified of admit

## 2018-11-04 NOTE — ED Provider Notes (Signed)
[facility] Emergency Department Provider Note  ____________________________________________   I have reviewed the triage vital signs and the nursing notes. Where available I have reviewed prior notes and, if possible and indicated, outside hospital notes.    HISTORY  Chief Complaint Shortness of Breath and Leg Swelling    HPI [name] is a 83 y.o. female  With a history of significant CHF, most recent echo showed EF of 5 to 40% on echo in March 2019 although her EF has been as low as 25%, history of Coumadin use for atrial fibrillation, history of pacemaker, history of continuing Lasix use presents today complaining that she is getting gradually more edema in bilateral lower extremity and more orthopnea and more exertional dyspnea to the point where she cannot really get around her house.  She denies any fever chills or cough.  She denies any chest pain fall or headache.  The swelling is bilateral.  No personal or family history of PE or DVT.  States she is compliant with her medications.  Other alleviating or aggravating features no other prior intervention,   Past Medical History:  Diagnosis Date  . Arthritis    "back, hands, knees" (04/10/2016)  . Chronic atrial fibrillation    a. On Coumadin  . Chronic back pain   . 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 EF 25% by echo in 08/2015 b. RHC in 08/2015 showed normal filling pressures  . Chronic kidney disease   . Compression fracture    "several; all in my back" (04/10/2016)  . GERD (gastroesophageal reflux disease)   . Hypertension   . Lymphedema   . S/P MVR (mitral valve replacement)    a. MVR 1994 b. redo MVR in 08/2014 - on Coumadin  . Shortness of breath dyspnea     Patient Active Problem List   Diagnosis Date Noted  . Encounter for therapeutic drug monitoring 09/09/2017  . Encounter for anticoagulation discussion and counseling  09/09/2017  . Paroxysmal atrial fibrillation ([place]) 02/25/2017  . Chronic venous insufficiency 10/27/2016  . Shortness of breath 08/14/2016  . Pressure injury of skin 04/12/2016  . Malnutrition of moderate degree 04/11/2016  . Vertebral compression fracture ([place]) 04/10/2016  . Intractable pain 04/10/2016  . Spinal compression fracture ([place]) 04/10/2016  . Chronic systolic heart failure ([place]) 11/19/2015  . Lymphedema   . CKD (chronic kidney disease), stage III ([place])   . Bilateral lower extremity edema 08/30/2015  . ARF (acute renal failure) ([place]) 04/28/2015  . Hyponatremia 04/28/2015  . H/O mitral valve replacement with mechanical valve 11/04/2014  . Long term current use of anticoagulant therapy 09/28/2014  . 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 Generalized OA 11/28/2013  . HLD (hyperlipidemia) 11/28/2013    Past Surgical History:  Procedure Laterality Date  . BREAST LUMPECTOMY Right 2005?   benign lump excision  . CARDIAC CATHETERIZATION    . [place], 2016   "MVR; MVR"  . CATARACT EXTRACTION W/PHACO Right 02/20/2015   Procedure: CATARACT EXTRACTION PHACO AND INTRAOCULAR LENS PLACEMENT (IOC);  Surgeon: Estill Cotta, MD;  Location: [facility] ORS;  Service: Ophthalmology;  Laterality: Right;  US01:31.2 AP 25.3% CDE40.13 Fluid lot # [id number]  . ELECTROPHYSIOLOGIC STUDY N/A 10/09/2015   Procedure: CARDIOVERSION;  Surgeon: Wellington Hampshire, MD;  Location: [facility] ORS;  Service: Cardiovascular;  Laterality: N/A;  . IR GENERIC HISTORICAL  04/01/2016   IR RADIOLOGIST EVAL & MGMT 04/01/2016 [facility]  . IR GENERIC HISTORICAL  04/15/2016   IR VERTEBROPLASTY CERV/THOR BX INC UNI/BIL INC/INJECT/IMAGING 04/15/2016 Luanne Bras, MD [facility]  . IR GENERIC HISTORICAL  04/15/2016   IR VERTEBROPLASTY EA ADDL (T&LS) BX INC UNI/BIL INC INJECT/IMAGING 04/15/2016 Luanne Bras, MD [facility]  . IR GENERIC HISTORICAL  04/15/2016   IR VERTEBROPLASTY  EA ADDL (T&LS) BX INC UNI/BIL INC INJECT/IMAGING 04/15/2016 Luanne Bras, MD [facility]  . IR GENERIC HISTORICAL  05/20/2016   IR RADIOLOGIST EVAL & MGMT 05/20/2016 [facility]  . IR GENERIC HISTORICAL  06/13/2016   IR VERTEBROPLASTY EA ADDL (T&LS) BX INC UNI/BIL INC INJECT/IMAGING 06/13/2016 Luanne Bras, MD [facility]  . IR GENERIC HISTORICAL  06/13/2016   IR SACROPLASTY BILATERAL 06/13/2016 Luanne Bras, MD [facility]  . IR GENERIC HISTORICAL  06/13/2016   IR VERTEBROPLASTY LUMBAR BX INC UNI/BIL INC/INJECT/IMAGING 06/13/2016 Luanne Bras, MD [facility]  . IRRIGATION AND DEBRIDEMENT HEMATOMA Left 07/05/2015   Procedure: IRRIGATION AND DEBRIDEMENT HEMATOMA;  Surgeon: Robert Bellow, MD;  Location: [facility] ORS;  Service: General;  Laterality: Left;  . pyloric stenosis  07/1937  . US ECHOCARDIOGRAPHY      Prior to Admission medications   Medication Sig Start Date End Date Taking? Authorizing Provider  acetaminophen (TYLENOL) 650 MG CR tablet Take 650 mg by mouth every 8 (eight) hours as needed for pain.    [provider]  amiodarone (PACERONE) 200 MG tablet Take 1 tablet (200 mg total) by mouth daily. 09/24/18   Minna Merritts, MD  aspirin EC 81 MG tablet Take 81 mg by mouth daily.    [provider]  Cholecalciferol (VITAMIN D) 2000 units tablet Take 2,000 Units by mouth daily.    [provider]  KLOR-CON 10 10 MEQ tablet Take 10 mEq by mouth daily.  05/08/16   [provider]  LASIX 20 MG tablet TAKE ONE TABLET BY MOUTH 3 TIMES DAILY 10/14/18   Minna Merritts, MD  losartan (COZAAR) 25 MG tablet TAKE 1/2 TABLET EVERY DAY 09/11/18   Minna Merritts, MD  Menthol (ICY HOT) 5 % PTCH Apply 1 patch topically daily as needed (pain).    [provider]  metolazone (ZAROXOLYN) 5 MG tablet Take 1 tablet (5 mg total) by mouth as needed (As needed for weight greater than 148). 09/29/17 03/05/18  Minna Merritts, MD   Multiple Vitamin (MULTIVITAMIN WITH MINERALS) TABS tablet Take 1 tablet by mouth daily.    [provider]  Probiotic Product (RISA-BID PROBIOTIC PO) Take 1 capsule by mouth daily.     [provider]  Simethicone (GAS-X PO) Take by mouth.    [provider]  spironolactone (ALDACTONE) 25 MG tablet Take 25 mg by mouth daily.     [provider]  vitamin C (ASCORBIC ACID) 500 MG tablet Take 500 mg by mouth daily.    [provider]  warfarin (COUMADIN) 6 MG tablet Take 1/2 or 1 tablet daily as directed by Coumadin clinic 10/01/18   Minna Merritts, MD    Allergies Ace inhibitors; Doxycycline; Hydrocodone; Mercury; Silver; Cephalexin; Clindamycin/lincomycin; Nickel; and Penicillins  Family History  Problem Relation Age of Onset  . Stroke Mother   . Renal Disease Father     Social History Social History   Tobacco Use  . Smoking status: Former Smoker    Packs/day: 1.00    Years: 27.00    Pack years: 27.00    Types: Cigarettes  Last attempt to quit: 07/01/1980    Years since quitting: 38.3  . Smokeless tobacco: Never Used  . Tobacco comment: "quit smoking ~ 1980  Substance Use Topics  . Alcohol use: Not Currently    Alcohol/week: 0.0 standard drinks    Comment: 04/10/2016 "I'll have a drink on holidays/special occasions"  . Drug use: No    Review of Systems Constitutional: No fever/chills Eyes: No visual changes. ENT: No sore throat. No stiff neck no neck pain Cardiovascular: Denies chest pain. Respiratory: + shortness of breath. Gastrointestinal:   no vomiting.  No diarrhea.  No constipation. Genitourinary: Negative for dysuria. Musculoskeletal: Negative lower extremity swelling Skin: Negative for rash. Neurological: Negative for severe headaches, focal weakness or numbness.   ____________________________________________   PHYSICAL EXAM:  VITAL SIGNS: ED Triage Vitals  Enc Vitals Group     BP 11/04/18 1119 (!) 90/58      Pulse Rate 11/04/18 1117 (!) 106     Resp 11/04/18 1117 18     Temp 11/04/18 1117 97.6 F (36.4 C)     Temp Source 11/04/18 1117 Oral     SpO2 11/04/18 1117 100 %     Weight 11/04/18 1122 148 lb 2.4 oz (67.2 kg)     Height 11/04/18 1116 4\' 11"  (1.499 m)     Head Circumference --      Peak Flow --      Pain Score 11/04/18 1115 7     Pain Loc --      Pain Edu? --      Excl. in [place]? --     Constitutional: Alert and oriented name and place unsure of the exact date. Well appearing and in no acute distress. Eyes: Conjunctivae are normal Head: Atraumatic HEENT: No congestion/rhinnorhea. Mucous membranes are moist.  Oropharynx non-erythematous Neck:   Nontender with no meningismus, no masses, no stridor Cardiovascular: Normal rate, regular rhythm. Grossly normal heart sounds.  Good peripheral circulation. Respiratory: Normal respiratory effort.  No retractions.  Diminished in the bases bilaterally Abdominal: Soft and nontender. No distention. No guarding no rebound Back:  There is no focal tenderness or step off.  there is no midline tenderness there are no lesions noted. there is no CVA tenderness Musculoskeletal: No lower extremity tenderness, no upper extremity tenderness. No joint effusions, no DVT signs strong distal pulses to 3+ edema Neurologic:  Normal speech and language. No gross focal neurologic deficits are appreciated.  Skin:  Skin is warm, dry and intact. No rash noted. Psychiatric: Mood and affect are normal. Speech and behavior are normal.  ____________________________________________   LABS (all labs ordered are listed, but only abnormal results are displayed)  Labs Reviewed  CBC - Abnormal; Notable for the following components:      Result Value   Hemoglobin 11.3 (*)    RDW 18.1 (*)    Platelets 146 (*)    All other components within normal limits  BASIC METABOLIC PANEL  BRAIN NATRIURETIC PEPTIDE  TROPONIN I  PROTIME-INR  APTT    Pertinent labs  results  that were available during my care of the patient were reviewed by me and considered in my medical decision making (see chart for details). ____________________________________________  EKG  I personally interpreted any EKGs ordered by me or triage Atrially sensed rhythm, V paced, rate of 106 ____________________________________________  RADIOLOGY  Pertinent labs & imaging results that were available during my care of the patient were reviewed by me and considered in my medical decision making (  see chart for details). If possible, patient and/or family made aware of any abnormal findings.  Dg Chest Port 1 View  Result Date: 11/04/2018 CLINICAL DATA:  Shortness of breath and bilateral lower extremity edema developing over the past few days. EXAM: PORTABLE CHEST 1 VIEW COMPARISON:  PA and lateral chest 10/10/2015. FINDINGS: The patient is status post mitral valve repair. There is cardiomegaly and pulmonary edema. No pneumothorax. Likely small pleural effusions noted. No acute bony abnormality. IMPRESSION: Cardiomegaly and interstitial edema. There are likely small bilateral pleural effusions. Electronically Signed   By: Inge Rise M.D.   On: 11/04/2018 12:47   ____________________________________________    PROCEDURES  Procedure(s) performed: None  Procedures  Critical Care performed: None  ____________________________________________   INITIAL IMPRESSION / ASSESSMENT AND PLAN / ED COURSE  Pertinent labs & imaging results that were available during my care of the patient were reviewed by me and considered in my medical decision making (see chart for details).  Here with increasing symptoms of CHF including orthopnea, bilateral lower extremity edema, and shortness of breath on exertion.  We will check BNP chest x-ray CBC troponin and reassess.  Low suspicion for PE.    ____________________________________________   FINAL CLINICAL IMPRESSION(S) / ED DIAGNOSES  Final  diagnoses:  SOB (shortness of breath)      This chart was dictated using voice recognition software.  Despite best efforts to proofread,  errors can occur which can change meaning.      Schuyler Amor, MD 11/04/18 1325

## 2018-11-04 NOTE — ED Triage Notes (Signed)
Pt here after being called from heart failure clinic to come here for low sodium from blood work taken yesterday. Pt also c/o increasing shob and ble edema over the past few days. Daughter in for triage as pt has been showing some confusion due to low Na. NAD, answering questions appropriately.

## 2018-11-04 NOTE — Telephone Encounter (Signed)
Spoke with patient's daughter, [name], regarding patient's lab results from yesterday and my conversation with Dr. Sabra Heck as well as my conversation with patient (see previous telephone note for details).   [name] says that she will shower and then go to her mom's to take her to the ER for further management and treatment. She was appreciative of the information.

## 2018-11-04 NOTE — Progress Notes (Addendum)
Pt arrived on the unit from the ED via stretcher at 1630. Pt was A&Ox4 with some forgetfulness. VSS. Pt ambulated with assistance in the room (pt uses a walker at home). Pt arrived with glasses, clothes/shoes, cell phone, charger, and personal hygiene items. Orders reviewed, acknowledged, and initiated. Care plan and education initiated. Pt has generalized scattered bruising and a unopened blister to the left calf and left shin. Pt c/o pain when touching her BLE. Pt educated on use of the call bell and not to get OOB without assistance. Pt verbalized understanding. Fall risk and allergy bracelets applied. Bed in lowest position, bed alarm is on, and the call bell is within reach.

## 2018-11-04 NOTE — Progress Notes (Signed)
Advanced care plan. Purpose of the Encounter: CODE STATUS Parties in Attendance: Patient Patient's Decision Capacity: Good Subjective/Patient's story: [name]  is a 84 y.o. female with a known history of chronic atrial fibrillation on Coumadin, combined systolic diastolic heart failure with EF of 25%, GERD, hypertension, lymphedema, mitral valve replacement on Coumadin presented to the emergency room for shortness of breath and swelling in the legs.  Patient has gained the weight for the last couple of days.  No complaints of any chest pain.  Patient was given IV Lasix for diuresis at the heart failure clinic.  And was being given diuretics and the dose has been adjusted in the outpatient clinic.  Renal function has been worsening and resistance to diuresis as outpatient.Hospitalist service is consulted for further care. Objective/Medical story She needs diuresis with Lasix and monitoring of renal function.  Needs nephrology and cardiology evaluation and echocardiogram. Goals of care determination:  Advance care directives goals of care treatment plan discussed Patient for now wants everything done which includes CPR, ventilator intubation if the need arises CODE STATUS: Full code Time spent discussing advanced care planning: 16 minutes

## 2018-11-04 NOTE — ED Notes (Signed)
ED TO INPATIENT HANDOFF REPORT  ED Nurse Name and Phone #: Anda Kraft [phone]  S Name/Age/Gender [name] 84 y.o. female Room/Bed: ED18A/ED18A  Code Status   Code Status: Prior  Home/SNF/Other Home Patient oriented to: self, time and situation Is this baseline? Yes   Triage Complete: Triage complete  Chief Complaint short of breath hx of chf  Triage Note Pt here after being called from heart failure clinic to come here for low sodium from blood work taken yesterday. Pt also c/o increasing shob and ble edema over the past few days. Daughter in for triage as pt has been showing some confusion due to low Na. NAD, answering questions appropriately.    Allergies Allergies  Allergen Reactions  . Ace Inhibitors     Cough  . Doxycycline Nausea Only  . Hydrocodone Itching and Nausea Only  . Mercury     Other reaction(s): Unknown  . Silver Dermatitis    Severe itching  . Cephalexin Rash  . Clindamycin/Lincomycin Rash  . Nickel Rash  . Penicillins Rash    Has patient had a PCN reaction causing immediate rash, facial/tongue/throat swelling, SOB or lightheadedness with hypotension: YES Has patient had a PCN reaction causing severe rash involving mucus membranes or skin necrosis: NO Has patient had a PCN reaction that required hospitalization NO Has patient had a PCN reaction occurring within the last 10 years: NO If all of the above answers are "NO", then may proceed with Cephalosporin use.    Level of Care/Admitting Diagnosis ED Disposition    ED Disposition Condition [place] [facility]: [place] [[id number]]  Level of Care: Telemetry [5]  Covid Evaluation: Screening Protocol (No Symptoms)  Diagnosis: Acute exacerbation of CHF (congestive heart failure) [facility]) [[id number]]  Admitting Physician: Saundra Shelling [[id number]]  Attending Physician: Saundra Shelling [[id number]]  Estimated length of stay: past midnight tomorrow  Certification:: I certify  this patient will need inpatient services for at least 2 midnights  PT Class (Do Not Modify): Inpatient [101]  PT Acc Code (Do Not Modify): Private [1]       B Medical/Surgery History Past Medical History:  Diagnosis Date  . Arthritis    "back, hands, knees" (04/10/2016)  . Chronic atrial fibrillation    a. On Coumadin  . Chronic back pain   . 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 EF 25% by echo in 08/2015 b. RHC in 08/2015 showed normal filling pressures  . Chronic kidney disease   . Compression fracture    "several; all in my back" (04/10/2016)  . GERD (gastroesophageal reflux disease)   . Hypertension   . Lymphedema   . S/P MVR (mitral valve replacement)    a. MVR 1994 b. redo MVR in 08/2014 - on Coumadin  . Shortness of breath dyspnea    Past Surgical History:  Procedure Laterality Date  . BREAST LUMPECTOMY Right 2005?   benign lump excision  . CARDIAC CATHETERIZATION    . [place], 2016   "MVR; MVR"  . CATARACT EXTRACTION W/PHACO Right 02/20/2015   Procedure: CATARACT EXTRACTION PHACO AND INTRAOCULAR LENS PLACEMENT (IOC);  Surgeon: Estill Cotta, MD;  Location: [facility] ORS;  Service: Ophthalmology;  Laterality: Right;  US01:31.2 AP 25.3% CDE40.13 Fluid lot # [id number]  . ELECTROPHYSIOLOGIC STUDY N/A 10/09/2015   Procedure: CARDIOVERSION;  Surgeon: Wellington Hampshire, MD;  Location: [facility] ORS;  Service: Cardiovascular;  Laterality: N/A;  .  IR GENERIC HISTORICAL  04/01/2016   IR RADIOLOGIST EVAL & MGMT 04/01/2016 [facility]  . IR GENERIC HISTORICAL  04/15/2016   IR VERTEBROPLASTY CERV/THOR BX INC UNI/BIL INC/INJECT/IMAGING 04/15/2016 Luanne Bras, MD [facility]  . IR GENERIC HISTORICAL  04/15/2016   IR VERTEBROPLASTY EA ADDL (T&LS) BX INC UNI/BIL INC INJECT/IMAGING 04/15/2016 Luanne Bras, MD [facility]  . IR GENERIC HISTORICAL  04/15/2016   IR VERTEBROPLASTY EA ADDL (T&LS) BX  INC UNI/BIL INC INJECT/IMAGING 04/15/2016 Luanne Bras, MD [facility]  . IR GENERIC HISTORICAL  05/20/2016   IR RADIOLOGIST EVAL & MGMT 05/20/2016 [facility]  . IR GENERIC HISTORICAL  06/13/2016   IR VERTEBROPLASTY EA ADDL (T&LS) BX INC UNI/BIL INC INJECT/IMAGING 06/13/2016 Luanne Bras, MD [facility]  . IR GENERIC HISTORICAL  06/13/2016   IR SACROPLASTY BILATERAL 06/13/2016 Luanne Bras, MD [facility]  . IR GENERIC HISTORICAL  06/13/2016   IR VERTEBROPLASTY LUMBAR BX INC UNI/BIL INC/INJECT/IMAGING 06/13/2016 Luanne Bras, MD [facility]  . IRRIGATION AND DEBRIDEMENT HEMATOMA Left 07/05/2015   Procedure: IRRIGATION AND DEBRIDEMENT HEMATOMA;  Surgeon: Robert Bellow, MD;  Location: [facility] ORS;  Service: General;  Laterality: Left;  . pyloric stenosis  07/1937  . US ECHOCARDIOGRAPHY       A IV Location/Drains/Wounds Patient Lines/Drains/Airways Status   Active Line/Drains/Airways    Name:   Placement date:   Placement time:   Site:   Days:   Peripheral IV 04/14/16 Right Hand   04/14/16    0614    Hand   934   Peripheral IV 11/04/18 Left Forearm   11/04/18    1200    Forearm   less than 1   Incision (Closed) 02/20/15 Eye Right   02/20/15    0756     1353   Incision (Closed) 07/05/15 Leg Left   07/05/15    1131     1218   Incision (Closed) 04/15/16 Back Upper   04/15/16    1537     933   Incision (Closed) 04/15/16 Back Right;Lower   04/15/16    1538     933   Pressure Injury 04/12/16 Stage II -  Partial thickness loss of dermis presenting as a shallow open ulcer with a red, pink wound bed without slough.    04/12/16    0800     936   Wound / Incision (Open or Dehisced) 09/28/15 Other (Comment) Leg hematoma that was surgically removed, that turned into a wound that's weeping   09/28/15    -    Leg   1133          Intake/Output Last 24 hours No intake or output data in the 24 hours ending 11/04/18 1427  Labs/Imaging Results for orders placed or  performed during the hospital encounter of 11/04/18 (from the past 48 hour(s))  CBC     Status: Abnormal   Collection Time: 11/04/18 11:41 AM  Result Value Ref Range   WBC 5.4 4.0 - 10.5 K/uL   RBC 4.06 3.87 - 5.11 MIL/uL   Hemoglobin 11.3 (L) 12.0 - 15.0 g/dL   HCT 36.3 36.0 - 46.0 %   MCV 89.4 80.0 - 100.0 fL   MCH 27.8 26.0 - 34.0 pg   MCHC 31.1 30.0 - 36.0 g/dL   RDW 18.1 (H) 11.5 - 15.5 %   Platelets 146 (L) 150 - 400 K/uL   nRBC 0.0 0.0 - 0.2 %    Comment: Performed at [facility], [address] [place], [place] [address]  Basic metabolic panel     Status: Abnormal   Collection Time: 11/04/18 11:41 AM  Result Value Ref Range   Sodium 130 (L) 135 - 145 mmol/L   Potassium 4.6 3.5 - 5.1 mmol/L   Chloride 93 (L) 98 - 111 mmol/L   CO2 25 22 - 32 mmol/L   Glucose, Bld 100 (H) 70 - 99 mg/dL   BUN 113 (H) 8 - 23 mg/dL    Comment: RESULT CONFIRMED BY MANUAL DILUTION MJU   Creatinine, Ser 3.05 (H) 0.44 - 1.00 mg/dL   Calcium 9.4 8.9 - 10.3 mg/dL   GFR calc non Af Amer 14 (L) >60 mL/min   GFR calc Af Amer 16 (L) >60 mL/min   Anion gap 12 5 - 15    Comment: Performed at [facility], [address] [place], [place] [address]  Brain natriuretic peptide     Status: Abnormal   Collection Time: 11/04/18 11:41 AM  Result Value Ref Range   B Natriuretic Peptide 505.0 (H) 0.0 - 100.0 pg/mL    Comment: Performed at [facility], [address] [place], [place] [address]  Troponin I - Add-On to previous collection     Status: Abnormal   Collection Time: 11/04/18 11:41 AM  Result Value Ref Range   Troponin I 0.03 (HH) <0.03 ng/mL    Comment: CRITICAL RESULT CALLED TO, READ BACK BY AND VERIFIED WITH KATE Azhar Knope @1247  11/04/18 MJU Performed at [facility], [address] [place], [place] [address]   Protime-INR     Status: Abnormal   Collection Time: 11/04/18 11:41 AM  Result Value Ref Range   Prothrombin Time 23.5 (H) 11.4 - 15.2 seconds    INR 2.1 (H) 0.8 - 1.2    Comment: (NOTE) INR goal varies based on device and disease states. Performed at [facility], [address] [place], [place] [address]   APTT     Status: Abnormal   Collection Time: 11/04/18 11:41 AM  Result Value Ref Range   aPTT 46 (H) 24 - 36 seconds    Comment:        IF BASELINE aPTT IS ELEVATED, SUGGEST PATIENT RISK ASSESSMENT BE USED TO DETERMINE APPROPRIATE ANTICOAGULANT THERAPY. Performed at [facility], [address] [place], [place] [address]    Dg Chest Port 1 View  Result Date: 11/04/2018 CLINICAL DATA:  Shortness of breath and bilateral lower extremity edema developing over the past few days. EXAM: PORTABLE CHEST 1 VIEW COMPARISON:  PA and lateral chest 10/10/2015. FINDINGS: The patient is status post mitral valve repair. There is cardiomegaly and pulmonary edema. No pneumothorax. Likely small pleural effusions noted. No acute bony abnormality. IMPRESSION: Cardiomegaly and interstitial edema. There are likely small bilateral pleural effusions. Electronically Signed   By: Inge Rise M.D.   On: 11/04/2018 12:47    Pending Labs Unresulted Labs (From admission, onward)    Start     Ordered   11/04/18 1404  SARS Coronavirus 2 (CEPHEID - Performed in [place] hospital lab), [place]  (Asymptomatic Patients Labs)  Once,   STAT    Question:  Rule Out  Answer:  Yes   11/04/18 1404   Signed and Held  Basic metabolic panel  Daily,   R     Signed and Held          Vitals/Pain Today's Vitals   11/04/18 1230 11/04/18 1330 11/04/18 1400  11/04/18 1415  BP: 93/61 93/75 (!) 107/96   Pulse: (!) 106 (!) 106  (!) 106  Resp: (!) 21 20 (!) 24   Temp:      TempSrc:      SpO2: 97% 100%  100%  Weight:      Height:      PainSc:        Isolation Precautions No active isolations  Medications Medications - No data to display  Mobility walks with person assist Low fall risk   Focused Assessments Cardiac  Assessment Handoff:    Lab Results  Component Value Date   CKTOTAL 73 03/22/2012   CKMB 0.9 03/22/2012   TROPONINI 0.03 ([place]) 11/04/2018   No results found for: DDIMER Does the Patient currently have chest pain? No     R Recommendations: See Admitting Provider Note  Report given to:   Additional Notes:

## 2018-11-04 NOTE — ED Notes (Signed)
Patient assisted to the restroom 

## 2018-11-04 NOTE — ED Notes (Addendum)
Daughter wants to be called by MD or RN after care is decided  [name] [phone] and son [name] [phone] if [name] can't be reached. She states her mom has sensitive skin and to be careful of tape. Also states lasix gtt has been the only treatment that works for her, to wrap her legs bilaterally to decrease the fluid and elevate.

## 2018-11-04 NOTE — Telephone Encounter (Signed)
Received phone call from patient saying that she's gained 2 pounds overnight after receiving 80mg  IV lasix/ 70meq PO potassium yesterday and wants to know if she can get some more. Told her I would need to review her lab results and then call her back.  Reviewed lab results from yesterday (11/03/2018) which showed decreased sodium at 129 and rising creatinine/ BUN of 2.92/ 107 with a GFR of 14. Spoke with her PCP, Dr. Sabra Heck, as he's been seeing her recently and adjusting diuretics. He agrees that with worsening renal function and declining sodium and her resistance to outpatient diureses, that she should go to the ER to be closely monitored.   Called patient and explained her lab results and my conversation with Dr. Sabra Heck. She expresses concern about going to the hospital because of COVID-19. I explained all the precautions/ cleaning measures that the hospital has been taking to minimize exposure. Discussed potential consequences of continued fluid retention in her and, again, that we feel she needed to be closely monitored while receiving any further diuresis.   Patient has asked that I call her daughter, [name] to discuss. LM on [name]'s voicemail asking her to call me back.

## 2018-11-04 NOTE — Consult Note (Signed)
Cardiology Consultation:   Patient ID: [name] MRN: [id number]; DOB: 1934-08-06  Admit date: 11/04/2018 Date of Consult: 11/04/2018  Primary Care Provider: Rusty Aus, MD Primary Cardiologist:Dr. Rockey Situ Primary Electrophysiologist:  None   Patient Profile:   [name] is a 84 y.o. female with a hx of chronic Afib s/p DCCV 09/2015 while on amiodarone, CRT-P 11/2017 / biv PPM in the setting of LBBB (Dr. Coy Saunas), combined systolic diastolic HF with EF 28-36% (08/2017, 05/2018), GERD, HTN, lymphedema on compression pumps, mitral valve replacement with [employer] Jude mechanical valve on Coumadin, renal insufficiency, former smoker, vertebral fractures with kyphoplasty and who is being seen today for the evaluation of acute on chronic combined HF at the request of Dr. Estanislado Pandy.  History of Present Illness:   [name] is an 84 yo female with PMH as above with known history of atrial fibrillation status post cardioversion in 2017 on amiodarone and Coumadin with INR checks per [facility] Cou[facility] clinic.  She had a CRT/ Biventricular PPM with atrial sensing ([facility]) implanted in the setting of left bundle branch block, as well as mitral valve replacement with [facility] Jude mechanical valve.  She was last seen by [facility] 03/18/2018. EKG showed paced rhythm at 61bpm. It was noted there was some suspicion for Afib/ Aflutter but device was not interrogated as she was asymptomatic and still on chronic anticoagulation with last INR 3.01. She felt her weight at home was stable (144lbs); however, it was also noted that she was using metolazone more sometimes for several days in a row for ankle swelling. Previous documentation indicated she was taking metolazone based off her ankle edema, rather than standing weights and labs showed elevated creatinine. Plan was for her to start taking her diuretics based on weight. She was instructed to follow a diuresis schedule with lasix 20mg  TID, metolazone PRN for  weight greater than 148lbs. If >146 lbs, take 4 of the 20mg  lasix spread out throughout the day. Metolazone to be based off of weight to prevent AKI.   09/01/2017 echo showed mild concentric hypertrophy, moderately reduced EF 35 to 40%, mild to moderate aortic stenosis, properly functioning mechanical mitral valve, mildly dilated left atrium, mildly increased PASP of 40 mmHg.  Follow-up 06/17/2018 echo showed moderate left ventricular dysfunction with EF estimated 40%, mild right ventricular systolic dysfunction, mild to moderate TR, mild to moderate AS, estimated RVSP 50 mmHg.  Since that time, she has been followed by the heart failure clinic and by her PCP. She reported that, over the last month, she has had a progressive cough that has produced clear phlegm as well as blood tinged phelgm. On further questioning, she stated she has no current signs of bleeding. No recent melena, hemoptysis, hematuria, or BRBPR.   Over the last month, she has had progressive SOB. She reportedly ambulates at home with a walker but does try to remain active and stated "I was not even able to go to my exercise class." Associated symptoms included cough and progressive LEE. She also reported orthopnea with one pillow but the head of the bed elevated to almost a vertical position. She did admit to eating takeout and not always limiting the amount of salt in her diet. She reported drinking almost 2L of fluid daily. No alcohol use. Patient diuresis, managed by PCP since last [facility] visit 03/2018, was recently increased from the above schedule and given her ongoing SOB and LEE. Follow-up labs were concerning for decreased renal function following this renal function  with her PCP advising she go to the ED.   On 11/04/2018, patient presented to [facility] ED after instruction from her PCP d/t rising Cr /BUN with GFR 14. Per documentation, PCP noted continued volume overload despite diuresis with worsening renal function and hyponatremia. In  the ED, patient reported SOB and bilateral lower extremity edema over the last several days. She was noted to be accompanied by her daughter due to some confusion with hyponatremia. She did report 2 episodes of dizziness in the setting of low BP and elevated HR and occurring when she got up too quickly. Vitals significant for hypotension with BP 90/58, tachycardia with HR 106. EKG showed atrially sensed rhythm, v paced, 106 bpm. Labs significant for hyponatremia with Na 129  130, AKI with Cr 3.05, BUN 113, K 3.9  4.6, BNP 505.0, troponin negative, anemia with Hgb 11.3, RBC 4.06, and INR 2.1. CXR showed interstitial edema and likely small bilateral pleural effusions. COVID-19 negative. Patient was started on IV lasix in the ED.  Past Medical History:  Diagnosis Date  . Arthritis    "back, hands, knees" (04/10/2016)  . Chronic atrial fibrillation    a. On Coumadin  . Chronic back pain   . 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 EF 25% by echo in 08/2015 b. RHC in 08/2015 showed normal filling pressures  . Chronic kidney disease   . Compression fracture    "several; all in my back" (04/10/2016)  . GERD (gastroesophageal reflux disease)   . Hypertension   . Lymphedema   . S/P MVR (mitral valve replacement)    a. MVR 1994 b. redo MVR in 08/2014 - on Coumadin  . Shortness of breath dyspnea     Past Surgical History:  Procedure Laterality Date  . BREAST LUMPECTOMY Right 2005?   benign lump excision  . CARDIAC CATHETERIZATION    . [place], 2016   "MVR; MVR"  . CATARACT EXTRACTION W/PHACO Right 02/20/2015   Procedure: CATARACT EXTRACTION PHACO AND INTRAOCULAR LENS PLACEMENT (IOC);  Surgeon: Estill Cotta, MD;  Location: [facility] ORS;  Service: Ophthalmology;  Laterality: Right;  US01:31.2 AP 25.3% CDE40.13 Fluid lot # [id number]  . ELECTROPHYSIOLOGIC STUDY N/A 10/09/2015   Procedure: CARDIOVERSION;  Surgeon: Wellington Hampshire, MD;  Location: [facility] ORS;  Service: Cardiovascular;  Laterality: N/A;  . IR GENERIC HISTORICAL  04/01/2016   IR RADIOLOGIST EVAL & MGMT 04/01/2016 [facility]  . IR GENERIC HISTORICAL  04/15/2016   IR VERTEBROPLASTY CERV/THOR BX INC UNI/BIL INC/INJECT/IMAGING 04/15/2016 Luanne Bras, MD [facility]  . IR GENERIC HISTORICAL  04/15/2016   IR VERTEBROPLASTY EA ADDL (T&LS) BX INC UNI/BIL INC INJECT/IMAGING 04/15/2016 Luanne Bras, MD [facility]  . IR GENERIC HISTORICAL  04/15/2016   IR VERTEBROPLASTY EA ADDL (T&LS) BX INC UNI/BIL INC INJECT/IMAGING 04/15/2016 Luanne Bras, MD [facility]  . IR GENERIC HISTORICAL  05/20/2016   IR RADIOLOGIST EVAL & MGMT 05/20/2016 [facility]  . IR GENERIC HISTORICAL  06/13/2016   IR VERTEBROPLASTY EA ADDL (T&LS) BX INC UNI/BIL INC INJECT/IMAGING 06/13/2016 Luanne Bras, MD [facility]  . IR GENERIC HISTORICAL  06/13/2016   IR SACROPLASTY BILATERAL 06/13/2016 Luanne Bras, MD [facility]  . IR GENERIC HISTORICAL  06/13/2016   IR VERTEBROPLASTY LUMBAR BX INC UNI/BIL INC/INJECT/IMAGING 06/13/2016 Luanne Bras, MD [facility]  . IRRIGATION AND DEBRIDEMENT HEMATOMA Left 07/05/2015   Procedure: IRRIGATION AND DEBRIDEMENT HEMATOMA;  Surgeon: Robert Bellow, MD;  Location: [facility] ORS;  Service: General;  Laterality: Left;  . pyloric stenosis  07/1937  . US ECHOCARDIOGRAPHY       Home Medications:  Prior to Admission medications   Medication Sig Start Date End Date Taking? Authorizing Provider  acetaminophen (TYLENOL) 650 MG CR tablet Take 650 mg by mouth every 8 (eight) hours as needed for pain.   Yes [provider]  ALPRAZolam (XANAX) 0.25 MG tablet Take 0.25 mg by mouth at bedtime as needed for sleep. 10/27/18  Yes [provider]  amiodarone (PACERONE) 200 MG tablet Take 1 tablet (200 mg total) by mouth daily. 09/24/18  Yes Minna Merritts, MD  aspirin EC 81 MG tablet Take 81 mg by mouth daily.    Yes [provider]  Cholecalciferol (VITAMIN D) 2000 units tablet Take 2,000 Units by mouth daily.   Yes [provider]  KLOR-CON 10 10 MEQ tablet Take 10 mEq by mouth 2 (two) times daily.    Yes [provider]  LASIX 20 MG tablet TAKE ONE TABLET BY MOUTH 3 TIMES DAILY Patient taking differently: Take 20 mg by mouth 3 (three) times daily.  10/14/18  Yes Gollan, Kathlene November, MD  losartan (COZAAR) 25 MG tablet TAKE 1/2 TABLET EVERY DAY Patient taking differently: Take 12.5 mg by mouth daily.  09/11/18  Yes Gollan, Kathlene November, MD  Menthol (ICY HOT) 5 % PTCH Apply 1 patch topically daily as needed (pain).   Yes [provider]  metolazone (ZAROXOLYN) 5 MG tablet Take 5 mg by mouth daily as needed (severe fluid retention).   Yes [provider]  Probiotic Product (RISA-BID PROBIOTIC PO) Take 1 capsule by mouth daily.    Yes [provider]  spironolactone (ALDACTONE) 25 MG tablet Take 25 mg by mouth daily.    Yes [provider]  vitamin C (ASCORBIC ACID) 500 MG tablet Take 500 mg by mouth daily.   Yes [provider]  warfarin (COUMADIN) 6 MG tablet Take 3-6 mg by mouth See admin instructions. Take 1 tablet (6mg ) by mouth every Monday, Wednesday, Friday evening and take  tablet (3mg ) by mouth every Tuesday, Thursday, Saturday and Sunday evening   Yes [provider]    Inpatient Medications: Scheduled Meds: . furosemide  40 mg Intravenous Daily   Continuous Infusions:  PRN Meds:   Allergies:    Allergies  Allergen Reactions  . Ace Inhibitors     Cough  . Doxycycline Nausea Only  . Hydrocodone Itching and Nausea Only  . Mercury     Other reaction(s): Unknown  . Silver Dermatitis    Severe itching  . Cephalexin Rash  . Clindamycin/Lincomycin Rash  . Nickel Rash  . Penicillins Rash    Has patient had a PCN reaction causing immediate rash, facial/tongue/throat swelling, SOB or lightheadedness with  hypotension: YES Has patient had a PCN reaction causing severe rash involving mucus membranes or skin necrosis: NO Has patient had a PCN reaction that required hospitalization NO Has patient had a PCN reaction occurring within the last 10 years: NO If all of the above answers are "NO", then may proceed with Cephalosporin use.    Social History:   Social History   Socioeconomic History  . Marital status: Widowed    Spouse name: Not on file  . Number of children: Not on file  . Years of education: Not on file  . Highest education level: Not on file  Occupational History  . Not on  file  Social Needs  . Financial resource strain: Not on file  . Food insecurity:    Worry: Not on file    Inability: Not on file  . Transportation needs:    Medical: Not on file    Non-medical: Not on file  Tobacco Use  . Smoking status: Former Smoker    Packs/day: 1.00    Years: 27.00    Pack years: 27.00    Types: Cigarettes    Last attempt to quit: 07/01/1980    Years since quitting: 38.3  . Smokeless tobacco: Never Used  . Tobacco comment: "quit smoking ~ 1980  Substance and Sexual Activity  . Alcohol use: Not Currently    Alcohol/week: 0.0 standard drinks    Comment: 04/10/2016 "I'll have a drink on holidays/special occasions"  . Drug use: No  . Sexual activity: Never  Lifestyle  . Physical activity:    Days per week: Not on file    Minutes per session: Not on file  . Stress: Not on file  Relationships  . Social connections:    Talks on phone: Not on file    Gets together: Not on file    Attends religious service: Not on file    Active member of club or organization: Not on file    Attends meetings of clubs or organizations: Not on file    Relationship status: Not on file  . Intimate partner violence:    Fear of current or ex partner: Not on file    Emotionally abused: Not on file    Physically abused: Not on file    Forced sexual activity: Not on file  Other Topics Concern  .  Not on file  Social History Narrative  . Not on file    Family History:    Family History  Problem Relation Age of Onset  . Stroke Mother   . Renal Disease Father      ROS:  Please see the history of present illness.  Review of Systems  Constitutional: Positive for malaise/fatigue. Negative for chills and fever.  Respiratory: Positive for cough, sputum production and shortness of breath. Negative for hemoptysis and wheezing.        Previous patient reported blood tinged phlegm. Not current  Cardiovascular: Positive for orthopnea and leg swelling. Negative for chest pain and palpitations.  Gastrointestinal: Positive for abdominal pain. Negative for blood in stool, constipation, diarrhea, melena and vomiting.       Diffuse TTP in abdomen  Genitourinary: Negative for hematuria.  Musculoskeletal: Negative for falls.  Neurological: Positive for dizziness and weakness. Negative for focal weakness and loss of consciousness.       2 previous episodes in setting of hypotension and tachycardia and when getting up too fast  Psychiatric/Behavioral: Negative for substance abuse.  All other systems reviewed and are negative.   All other ROS reviewed and negative.     Physical Exam/Data:   Vitals:   11/04/18 1230 11/04/18 1330 11/04/18 1400 11/04/18 1415  BP: 93/61 93/75 (!) 107/96   Pulse: (!) 106 (!) 106  (!) 106  Resp: (!) 21 20 (!) 24   Temp:      TempSrc:      SpO2: 97% 100%  100%  Weight:      Height:       No intake or output data in the 24 hours ending 11/04/18 1448 Filed Weights   11/04/18 1122  Weight: 67.2 kg   Body mass index is 29.92 kg/m.  General:  Well nourished, well developed, in no acute distress. Elevated head of bead. On [place] 3L oxygen HEENT: normal Lymph: no adenopathy Neck: Elevated JVP ~9-10. JVD not assessed due to patient position. Cardiac:  tachycardic; 1/6 systolic murmur. Mechanical valve click Lungs: Bibasilar reduced breath sounds. Poor inspiratory  effort. no wheezing, rhonchi or rales  Abd: somewhat tender and distended Ext: 2-3+ pitting bilateral lower extremity edema. L leg with ace bandage d/t lymphedema Musculoskeletal:  No deformities Skin: warm and dry  Neuro:  CNs 2-12 intact, no focal abnormalities noted Psych:  Normal affect   EKG:  The EKG was personally reviewed and demonstrates: Paced rhythm Telemetry:  Telemetry was personally reviewed and demonstrates:  Paced rhythm, low 100bpm  Relevant CV Studies:  TTE 06/17/2018 MODERATE LV DYSFUNCTION (See above)   MILD RV SYSTOLIC DYSFUNCTION (See above)   VALVULAR REGURGITATION: MILD TR   VALVULAR STENOSIS: MILD AS   PROSTHETIC VALVE(S): MECH PROSTHETIC MV   ESTIMATED RVSP= 50 mmHg   MILD - MODERATE TRICUSPID REGURGITATION  TTE 09/01/2017 Left ventricle: The cavity size was normal. There was mild   concentric hypertrophy. Systolic function was moderately reduced.   The estimated ejection fraction was in the range of 35% to 40%.   Images were inadequate for LV wall motion assessment. The study   is not technically sufficient to allow evaluation of LV diastolic   function. - Aortic valve: There was mild to moderate stenosis. Mean gradient   (S): 10 mm Hg. Valve area (VTI): 1.17 cm^2. - Mitral valve: A mechanical prosthesis was present and functioning   normally. Mean gradient (D): 3 mm Hg. - Left atrium: The atrium was mildly dilated. - Pulmonary arteries: Systolic pressure was mildly increased. PA   peak pressure: 40 mm Hg (S).  Laboratory Data:  Chemistry Recent Labs  Lab 11/03/18 1314 11/04/18 1141  NA 129* 130*  K 3.9 4.6  CL 90* 93*  CO2 22 25  GLUCOSE 110* 100*  BUN 107* 113*  CREATININE 2.92* 3.05*  CALCIUM 9.1 9.4  GFRNONAA 14* 14*  GFRAA 17* 16*  ANIONGAP 17* 12    No results for input(s): PROT, ALBUMIN, AST, ALT, ALKPHOS, BILITOT in the last 168 hours. Hematology Recent Labs  Lab 11/04/18 1141  WBC 5.4  RBC 4.06  HGB 11.3*  HCT 36.3   MCV 89.4  MCH 27.8  MCHC 31.1  RDW 18.1*  PLT 146*   Cardiac Enzymes Recent Labs  Lab 11/04/18 1141  TROPONINI 0.03*   No results for input(s): TROPIPOC in the last 168 hours.  BNP Recent Labs  Lab 11/03/18 1314 11/04/18 1141  BNP 496.0* 505.0*    DDimer No results for input(s): DDIMER in the last 168 hours.  Radiology/Studies:  Dg Chest Port 1 View  Result Date: 11/04/2018 CLINICAL DATA:  Shortness of breath and bilateral lower extremity edema developing over the past few days. EXAM: PORTABLE CHEST 1 VIEW COMPARISON:  PA and lateral chest 10/10/2015. FINDINGS: The patient is status post mitral valve repair. There is cardiomegaly and pulmonary edema. No pneumothorax. Likely small pleural effusions noted. No acute bony abnormality. IMPRESSION: Cardiomegaly and interstitial edema. There are likely small bilateral pleural effusions. Electronically Signed   By: Inge Rise M.D.   On: 11/04/2018 12:47    Assessment and Plan:   Chronic combined systolic and diastolic heart failure -Etiology of LEE on exam likely multifactorial given h/o lymphedema, heart failure, worsening renal function and with possible cardiorenal syndrome. SOB also likely  multifactorial with heart failure, RVR, valvular disease, elevated PASP/pulmonary pressures, past history of smoking, sedentary at baseline. Will add on albumin as third spacing may be contributing as well. BNP not significantly elevated at 505. Hyponatremia suspicious for worsening HF and pending updated echo.  - Previous echo showed mild EF reduction and 35-40% but normally functioning mechanical mitral valve with mild to moderate aortic stenosis and mildly elevated PASP compared with 2017 echo. CXR showed small b/l pleural effusions, pulmonary edema. Pending updated echo this admission. - Per review of EMR, diuresis schedule per primary cardiologist at last visit was lasix 20mg  TID, metolazone PRN for weight greater than 148lbs. If >146 lbs,  take 4 of the 20mg  lasix spread out throughout the day. Metolazone to be based off of weight to prevent AKI. Patient diuresis has been managed by PCP with recent increase in diuresis.   - Hold lasix pending echo and updated labs given current renal function. Daily BMET recommended with elevated Creatinine from baseline with BUN 113. Consider nephrology consultation. Check Mg. I/O, daily weights. Discontinued lasix, potassium, spironolactone, and ARB / losartan given patient is currently hypotensive with elevated potassium. Continue holding metolazone. Further recommendations pending labs and echo   AKI / Acute on chronic kidney disease  - Cr 3.05, BUN 113. Significantly elevated. Baseline Cr 1.4 with BUN 50. Holding diuresis pending updated echo and labs. Avoid nephrotoxins and contrast procedures. Discontinued lasix, potassium supplementation, losartan, and spironolactone with hypotension and given elevated Cr/K. Continue holding metolazone.   Anemia - Patient reporting ongoing h/o of chronic cough with h/o blood tinged phlegm that is not current. Previous INR supratherapeutic at 5.3. Currently 2.1. No patient report of current bloody phlegm, melena, BRBPR, or hematuria.   - Current at baseline with Hgb 11.3 and RBC 4.06. Close monitoring of CBC and further signs or symptoms of bleeding. Recommend transfusion for Hgb below 8.0.   Paroxysmal Atrial Fibrillation Typical Atrial flutter  S/p CRT - Current elevated HR in low 100s with escalation of therapy limited by hypotension and renal function. Ideally goal HR less than 100bpm.  - Asymptomatic other than two episodes of "feeling dizzy" with hypotension and tachycardia likely contributing. Device check recommended if able per current social distancing guidelines - Continue amiodarone and Coumadin. Monitor INR. INR 2.1 with goal INR >2.5. Checking liver function given last check 2017 on amiodarone.  Mitral valve replacement with mechanical valve -  Continue coumadin and INR checks. Most recent echo as above with mild to moderate AS and mechanical valve functioning appropriately. Pending update.   Mixed Hyperlipidemia - Not on statin  Chronic back pain - Per IM   For questions or updates, please contact [place] Please consult [email or web address] for contact info under     Signed, Arvil Chaco, PA-C  11/04/2018 2:48 PM

## 2018-11-04 NOTE — ED Notes (Signed)
Date and time results received: 11/04/18 1254 (use smartphrase ".now" to insert current time)  Test: troponin Critical Value: 0.03  Name of Provider Notified: McShane MD  Orders Received? Or Actions Taken?:

## 2018-11-05 ENCOUNTER — Telehealth: Payer: Self-pay | Admitting: Cardiovascular Disease

## 2018-11-05 ENCOUNTER — Inpatient Hospital Stay (HOSPITAL_COMMUNITY)
Admit: 2018-11-05 | Discharge: 2018-11-05 | Disposition: A | Discharge status: Home / Self Care | Payer: Medicare Other | Attending: Internal Medicine | Admitting: Internal Medicine

## 2018-11-05 ENCOUNTER — Telehealth: Payer: Self-pay | Admitting: Physician Assistant

## 2018-11-05 DIAGNOSIS — I483 Typical atrial flutter: Secondary | ICD-10-CM

## 2018-11-05 DIAGNOSIS — I361 Nonrheumatic tricuspid (valve) insufficiency: Secondary | ICD-10-CM

## 2018-11-05 LAB — BASIC METABOLIC PANEL
Anion gap: 14 (ref 5–15)
BUN: 118 mg/dL — ABNORMAL HIGH (ref 8–23)
CO2: 23 mmol/L (ref 22–32)
Calcium: 9 mg/dL (ref 8.9–10.3)
Chloride: 93 mmol/L — ABNORMAL LOW (ref 98–111)
Creatinine, Ser: 2.79 mg/dL — ABNORMAL HIGH (ref 0.44–1.00)
GFR calc Af Amer: 18 mL/min — ABNORMAL LOW (ref >60)
GFR calc non Af Amer: 15 mL/min — ABNORMAL LOW (ref >60)
Glucose, Bld: 102 mg/dL — ABNORMAL HIGH (ref 70–99)
Potassium: 4.3 mmol/L (ref 3.5–5.1)
Sodium: 130 mmol/L — ABNORMAL LOW (ref 135–145)

## 2018-11-05 LAB — PROTIME-INR
INR: 2.2 — ABNORMAL HIGH (ref 0.8–1.2)
Prothrombin Time: 24.2 seconds — ABNORMAL HIGH (ref 11.4–15.2)

## 2018-11-05 LAB — ECHOCARDIOGRAM COMPLETE
Height: 59 in
Weight: 2443.2 oz

## 2018-11-05 NOTE — Progress Notes (Signed)
Progress Note  Patient Name: [name] Date of Encounter: 11/05/2018  Primary Cardiologist: Ida Rogue, MD   Subjective   Patient reports no change in breathing.  She continues to deny CP, palpitations, and racing HR.  Reports she has been up to ambulate for urination 4-5x; however, no variability in heart rate on telemetry with rates still in 100s bpm. Informed her we are contacting [employer] today to investigate interrogation of device to consider cardioversion if  needed to improve SOB.  Inpatient Medications    Scheduled Meds: . acidophilus  1 capsule Oral Daily  . amiodarone  200 mg Oral Daily  . aspirin EC  81 mg Oral Daily  . cholecalciferol  2,000 Units Oral Daily  . mouth rinse  15 mL Mouth Rinse BID  . sodium chloride flush  3 mL Intravenous Q12H  . vitamin C  500 mg Oral Daily  . warfarin  3 mg Oral Once per day on Sun Tue Thu Sat  . warfarin  6 mg Oral Once per day on Mon Wed Fri  . Warfarin - Physician Dosing Inpatient   Does not apply q1800   Continuous Infusions: . sodium chloride     PRN Meds: sodium chloride, acetaminophen, ALPRAZolam, metolazone, ondansetron (ZOFRAN) IV, sodium chloride flush   Vital Signs    Vitals:   11/04/18 1650 11/04/18 1941 11/05/18 0414 11/05/18 0806  BP:  (!) 82/54 93/70 113/78  Pulse:  (!) 106 (!) 106 (!) 104  Resp:  20 18 20   Temp:  97.6 F (36.4 C) 97.8 F (36.6 C) 98.5 F (36.9 C)  TempSrc:  Oral Oral Oral  SpO2:  100% 94% 100%  Weight: 69.3 kg     Height: 4\' 11"  (1.499 m)       Intake/Output Summary (Last 24 hours) at 11/05/2018 0917 Last data filed at 11/05/2018 0831 Gross per 24 hour  Intake 333 ml  Output 700 ml  Net -367 ml   Filed Weights   11/04/18 1122 11/04/18 1650  Weight: 67.2 kg 69.3 kg    Telemetry    Paced, Regular, tachycardic, concerning for atrial tachycardia with rates consistently in 100s even on ambulation- Personally Reviewed  ECG    No new tracings - Personally  Reviewed  Physical Exam   GEN: No acute distress.   Neck: JVP 9-10. 3L [place] Cardiac: tachycardic, 1/6 systolic murmur; no rubs or gallops.  Respiratory: Clear to auscultation bilaterally. GI: Soft, nontender, non-distended  MS: 2+ b/l piting edema; No deformity. Neuro:  Nonfocal  Psych: Normal affect   Labs    Chemistry Recent Labs  Lab 11/03/18 1314 11/04/18 1141 11/05/18 0441  NA 129* 130* 130*  K 3.9 4.6 4.3  CL 90* 93* 93*  CO2 22 25 23   GLUCOSE 110* 100* 102*  BUN 107* 113* 118*  CREATININE 2.92* 3.05* 2.79*  CALCIUM 9.1 9.4 9.0  PROT  --  7.1  --   ALBUMIN  --  4.3  4.4  --   AST  --  25  --   ALT  --  25  --   ALKPHOS  --  84  --   BILITOT  --  1.1  --   GFRNONAA 14* 14* 15*  GFRAA 17* 16* 18*  ANIONGAP 17* 12 14     Hematology Recent Labs  Lab 11/04/18 1141  WBC 5.4  RBC 4.06  HGB 11.3*  HCT 36.3  MCV 89.4  MCH 27.8  MCHC 31.1  RDW 18.1*  PLT 146*    Cardiac Enzymes Recent Labs  Lab 11/04/18 1141  TROPONINI 0.03*   No results for input(s): TROPIPOC in the last 168 hours.   BNP Recent Labs  Lab 11/03/18 1314 11/04/18 1141  BNP 496.0* 505.0*     DDimer No results for input(s): DDIMER in the last 168 hours.   Radiology    Dg Chest Port 1 View  Result Date: 11/04/2018 CLINICAL DATA:  Shortness of breath and bilateral lower extremity edema developing over the past few days. EXAM: PORTABLE CHEST 1 VIEW COMPARISON:  PA and lateral chest 10/10/2015. FINDINGS: The patient is status post mitral valve repair. There is cardiomegaly and pulmonary edema. No pneumothorax. Likely small pleural effusions noted. No acute bony abnormality. IMPRESSION: Cardiomegaly and interstitial edema. There are likely small bilateral pleural effusions. Electronically Signed   By: Inge Rise M.D.   On: 11/04/2018 12:47    Cardiac Studies   Pending echo   TTE 06/17/2018 MODERATE LV DYSFUNCTION (See above)   MILD RV SYSTOLIC DYSFUNCTION (See above)    VALVULAR REGURGITATION: MILD TR   VALVULAR STENOSIS: MILD AS   PROSTHETIC VALVE(S): MECH PROSTHETIC MV   ESTIMATED RVSP= 50 mmHg   MILD - MODERATE TRICUSPID REGURGITATION  TTE 09/01/2017 Left ventricle: The cavity size was normal. There was mild concentric hypertrophy. Systolic function was moderately reduced. The estimated ejection fraction was in the range of 35% to 40%. Images were inadequate for LV wall motion assessment. The study is not technically sufficient to allow evaluation of LV diastolic function. - Aortic valve: There was mild to moderate stenosis. Mean gradient (S): 10 mm Hg. Valve area (VTI): 1.17 cm^2. - Mitral valve: A mechanical prosthesis was present and functioning normally. Mean gradient (D): 3 mm Hg. - Left atrium: The atrium was mildly dilated. - Pulmonary arteries: Systolic pressure was mildly increased. PA peak pressure: 40 mm Hg (S).   Patient Profile     84 y.o. female with a hx of chronic Afib s/p DCCV 09/2015 while on amiodarone, CRT-P 11/2017 / biv PPM in the setting of LBBB (Dr. Coy Saunas), combined systolic diastolic HF with EF 33-54% (08/2017, 05/2018), GERD, HTN, lymphedema on compression pumps, mitral valve replacement with [employer] mechanical valve on Coumadin, renal insufficiency, former smoker, vertebral fractures with kyphoplasty and who is being seen today for the evaluation of acute on chronic combined HF at the request of Dr. Estanislado Pandy.  Assessment & Plan    Chronic combined systolic and diastolic heart failure Pulmonary HTN Mechanical Valve, Aortic Stenosis -Etiology of LEE on exam likely multifactorial given h/o lymphedema, heart failure, worsening renal function / possible cardiorenal syndrome. SOB also likely multifactorial with heart failure, RVR, valvular disease, elevated PASP/pulmonary pressures, long past history of smoking, sedentary at baseline.  - Patient diuresis has been managed by PCP with recent increase in diuresis  d/t persistent LEE/SOB. As below, renal function now suspicious for prerenal AKI. - Previous echo showed mild EF reduction and 35-40%, normally functioning mechanical MV with mild -moderate AS and mildly elevated PASP. Pending updated echo update this admission. Also pending device interrogation as below: sinus tachycardia versus Afib/flutter with RVR. Telemetry / EKG shows v paced rhythm , QRS 178. Rep contacted today as rates unvarying with ambulation. Could also consider pulmonary HTN clinic as outpatient.  - Daily BMET recommended with elevated Cr/BUN. I/O, daily weights.  - Discontinued lasix, potassium, spironolactone, and ARB / losartan given patient is currently hypotensive with elevated potassium. Continue  holding metolazone. - Further recommendations pending echo, device interrogation.   Sinus Tachycardia versus Paroxysmal Atrial Fibrillation H/o Typical Atrial flutter S/p CRT-P ([employer] Percepta 12/10/2017) - HR in low 100s, escalation of therapy limited by hypotension and renal function. Ideally goal HR less than 100bpm.  Sinus tachycardia versus AT. - Med[employer] Percepta with RA lead, RV lead, LV lead - Currently subtherapeutic with INR 2.2 and goal INR > 2.5. - Recommend adjustment of coumadin as needed for therapeutic INR - Continue amiodarone and Coumadin with INR checks. Recommendation to check liver function as not recently checked on amiodarone. - Device check requested with representative estimating [employer] device check later this afternoon. If atrial fib/flutter, consider DCCV this admission to reduce cardiovascular burden / SOB given rapid ventricular rate and as long as patient therapeutically anticoagulated. Dr. Rockey Situ to discuss with EP.  AKI / Acute on chronic kidney disease  - Cr 3.05  2.79, BUN 113  118. Significantly elevated. Baseline Cr 1.4 with BUN 50. Suspicious for prerenal AKI in setting of aggressive diuresis as above with corresponding hypotension and  tachycardia. - Holding diuresis, KCl supplementation, nephrotoxic medications, antihypertensives. Avoid nephrotoxins and contrast procedures.   Anemia - Recommend order CBC for today, transfusion for Hgb below 8.0. Patient reporting ongoing h/o of chronic cough with h/o blood tinged phlegm that is not current. No patient report of current bloody phlegm, melena, BRBPR, or hematuria.   Mixed Hyperlipidemia - Not on statin   For questions or updates, please contact [place] Please consult [email or web address] for contact info under        Signed, Arvil Chaco, PA-C  11/05/2018, 9:17 AM

## 2018-11-05 NOTE — Progress Notes (Signed)
Medtronic device interrogated this afternoon Showing atrial tachycardia rate predominantly above 100-110 Unable to overdrive pacer out of the rhythm Case discussed with Dr. Caryl Comes, EP We will perform cardioversion tomorrow Discussed with patient's daughter who is in agreement Pacer download has been forwarded to [facility] EP by [employer] rep optimvol is relatively low suggesting lower extremity edema secondary to lymphedema Unable to wear compression hose, will suggest Ace wraps  Signed, Esmond Plants, MD, Ph.D [facility]

## 2018-11-05 NOTE — Telephone Encounter (Signed)
I spoke with [name] via text- she asked if I could reach out to [place] with MD[employer] and ask her to call Dr. Rockey Situ once the patient's device was interrogated.  Text received from [place] at at 2:30 pm that she had just finished a case with Dr. Lovena Le and she would be headed to [facility] soon. OK per Dr. Rockey Situ to give her his cell # to call.  Tomi Bamberger made aware.   [name] also updated of the above information.

## 2018-11-05 NOTE — Progress Notes (Signed)
[place] [facility] [place] NAME: [name]    MR#:  [id number]  DATE OF BIRTH:  07-Aug-1934  SUBJECTIVE:  CHIEF COMPLAINT:   Chief Complaint  Patient presents with  . Shortness of Breath  . Leg Swelling  Patient seen and evaluated today Currently on oxygen via nasal cannula Decreased shortness of breath No complaints of chest pain No fever  REVIEW OF SYSTEMS:    ROS  CONSTITUTIONAL: No documented fever. No fatigue, weakness. No weight gain, no weight loss.  EYES: No blurry or double vision.  ENT: No tinnitus. No postnasal drip. No redness of the oropharynx.  RESPIRATORY: No cough, no wheeze, no hemoptysis. Has decreased dyspnea.  CARDIOVASCULAR: No chest pain. No orthopnea. No palpitations. No syncope.  GASTROINTESTINAL: No nausea, no vomiting or diarrhea. No abdominal pain. No melena or hematochezia.  GENITOURINARY: No dysuria or hematuria.  ENDOCRINE: No polyuria or nocturia. No heat or cold intolerance.  HEMATOLOGY: No anemia. No bruising. No bleeding.  INTEGUMENTARY: No rashes. No lesions.  MUSCULOSKELETAL: No arthritis. No swelling. No gout.  NEUROLOGIC: No numbness, tingling, or ataxia. No seizure-type activity.  PSYCHIATRIC: No anxiety. No insomnia. No ADD.   DRUG ALLERGIES:   Allergies  Allergen Reactions  . Ace Inhibitors     Cough  . Doxycycline Nausea Only  . Hydrocodone Itching and Nausea Only  . Mercury     Other reaction(s): Unknown  . Silver Dermatitis    Severe itching  . Cephalexin Rash  . Clindamycin/Lincomycin Rash  . Nickel Rash  . Penicillins Rash    Has patient had a PCN reaction causing immediate rash, facial/tongue/throat swelling, SOB or lightheadedness with hypotension: YES Has patient had a PCN reaction causing severe rash involving mucus membranes or skin necrosis: NO Has patient had a PCN reaction that required hospitalization NO Has patient had a PCN reaction occurring within the last 10  years: NO If all of the above answers are "NO", then may proceed with Cephalosporin use.    VITALS:  Blood pressure 113/78, pulse (!) 104, temperature 98.5 F (36.9 C), temperature source Oral, resp. rate 20, height 4\' 11"  (1.499 m), weight 69.3 kg, SpO2 100 %.  PHYSICAL EXAMINATION:   Physical Exam  GENERAL:  84 y.o.-year-old patient lying in the bed with no acute distress.  EYES: Pupils equal, round, reactive to light and accommodation. No scleral icterus. Extraocular muscles intact.  HEENT: Head atraumatic, normocephalic. Oropharynx and nasopharynx clear.  NECK:  Supple, no jugular venous distention. No thyroid enlargement, no tenderness.  LUNGS: Improved breath sounds bilaterally, decreased bilateral crepitations, . No use of accessory muscles of respiration.  CARDIOVASCULAR: S1, S2 normal. No murmurs, rubs, or gallops.  ABDOMEN: Soft, nontender, nondistended. Bowel sounds present. No organomegaly or mass.  EXTREMITIES: No cyanosis, clubbing or edema b/l.    NEUROLOGIC: Cranial nerves II through XII are intact. No focal Motor or sensory deficits b/l.   PSYCHIATRIC: The patient is alert and oriented x 3.  SKIN: No obvious rash, lesion, or ulcer.   LABORATORY PANEL:   CBC Recent Labs  Lab 11/04/18 1141  WBC 5.4  HGB 11.3*  HCT 36.3  PLT 146*   ------------------------------------------------------------------------------------------------------------------ Chemistries  Recent Labs  Lab 11/04/18 1141 11/05/18 0441  NA 130* 130*  K 4.6 4.3  CL 93* 93*  CO2 25 23  GLUCOSE 100* 102*  BUN 113* 118*  CREATININE 3.05* 2.79*  CALCIUM 9.4 9.0  AST 25  --   ALT  25  --   ALKPHOS 84  --   BILITOT 1.1  --    ------------------------------------------------------------------------------------------------------------------  Cardiac Enzymes Recent Labs  Lab 11/04/18 1141  TROPONINI 0.03*    ------------------------------------------------------------------------------------------------------------------  RADIOLOGY:  Dg Chest Port 1 View  Result Date: 11/04/2018 CLINICAL DATA:  Shortness of breath and bilateral lower extremity edema developing over the past few days. EXAM: PORTABLE CHEST 1 VIEW COMPARISON:  PA and lateral chest 10/10/2015. FINDINGS: The patient is status post mitral valve repair. There is cardiomegaly and pulmonary edema. No pneumothorax. Likely small pleural effusions noted. No acute bony abnormality. IMPRESSION: Cardiomegaly and interstitial edema. There are likely small bilateral pleural effusions. Electronically Signed   By: Inge Rise M.D.   On: 11/04/2018 12:47     ASSESSMENT AND PLAN:   84 yr old female patient with a known history of chronic atrial fibrillation on Coumadin, combined systolic diastolic heart failure with EF of 25%, GERD, hypertension, lymphedema, mitral valve replacement on Coumadin presented to the emergency room for shortness of breath and swelling in the legs.  Patient has gained the weight for the last couple of days to presentation to the hospital.   -Acute on chronic systolic and diastolic heart failure exacerbation Improving Diuretics held by cardiology in view of low blood pressure Daily input output chart Daily body weight Cardiology consult appreciated Recheck echocardiogram  -Chronic atrial fibrillation Continue Coumadin for anticoagulation Continue oral amiodarone  -GERD : PPI  -DVT prophylaxis On anticoagulation with Coumadin  -[employer]onic device check requested for today afternoon  -Acute on chronic kidney disease stage III Improving slowly Diuretics on hold Avoid nephrotoxic medication Monitor renal function  All the records are reviewed and case discussed with Care Management/Social Worker. Management plans discussed with the patient, family and they are in agreement.  CODE STATUS: Full  code  DVT Prophylaxis: SCDs  TOTAL TIME TAKING CARE OF THIS PATIENT: 36 minutes.   POSSIBLE D/C IN 2 to 3 DAYS, DEPENDING ON CLINICAL CONDITION.  Saundra Shelling M.D on 11/05/2018 at 10:48 AM  Between 7am to 6pm - Pager - [phone]  After 6pm go to [email or web address] - password [email or web address] [facility]  Office  [phone]  CC: Primary care physician; Rusty Aus, MD  Note: This dictation was prepared with Dragon dictation along with smaller phrase technology. Any transcriptional errors that result from this process are unintentional.

## 2018-11-05 NOTE — Telephone Encounter (Signed)
Currently in-patient. To Dr. Wayland Denis, PA to review.

## 2018-11-05 NOTE — Consult Note (Signed)
[place] Nurse wound consult note Reason for Consult: Consult requested for left calf; reviewed progress notes and patient was noted to have a "small intact fluid filled blister" to her left calf. Plan: Xeroform gauze to protect and promote healing, foam dressing from further injury. Please re-consult if further assistance is needed.  Thank-you,  [name] MSN, [place], [place], [place], [place]

## 2018-11-05 NOTE — Telephone Encounter (Signed)
Spoke with daughter and updated her on the patient's current admission. Also let daughter know I might be in touch later today if we have trouble getting a [employer] interrogation representative out her, so that the daughter can bring over her box and we can get the interrogation sent over to [facility].   Daughter is appreciative of the update.

## 2018-11-05 NOTE — Progress Notes (Addendum)
Pt have a scheduled cardioversion in the morning. An order was placed to start 0.9 % normal saline at a rate of 1 ml/hr a dose of 250 start date 11/06/2018 at 0000. Cardiology was page to confirm the order. Talked to Dr. Percival Spanish and states to  place pt on 10 cc KVO at 0600. Will continue to monitor.

## 2018-11-05 NOTE — Telephone Encounter (Signed)
Patient's daughter [name] calling  Patient is currently admitted and Dr Rockey Situ has seen her Daughter would like to speak with a nurse or Dr Rockey Situ to discuss patient before any decisions on plan of care were made Due to patient's sodium levels being off patient is confused and may not be giving correct information [name] would like to clarify Please call at [phone], her phone goes in and out of service so it may go straight to VM, she will most likely call right back

## 2018-11-05 NOTE — Progress Notes (Signed)
*  PRELIMINARY RESULTS* Echocardiogram 2D Echocardiogram has been performed.  [name] 11/05/2018, 2:52 PM

## 2018-11-05 NOTE — Telephone Encounter (Signed)
error 

## 2018-11-06 ENCOUNTER — Inpatient Hospital Stay: Payer: Medicare Other | Admitting: Certified Registered Nurse Anesthetist

## 2018-11-06 ENCOUNTER — Other Ambulatory Visit: Payer: Self-pay

## 2018-11-06 ENCOUNTER — Telehealth: Payer: Self-pay | Admitting: Cardiovascular Disease

## 2018-11-06 ENCOUNTER — Encounter: Payer: Self-pay | Admitting: Cardiovascular Disease

## 2018-11-06 ENCOUNTER — Encounter
Admission: EM | Disposition: A | Discharge status: Home Health | Payer: Self-pay | Source: Home / Self Care | Attending: Internal Medicine

## 2018-11-06 DIAGNOSIS — I471 Supraventricular tachycardia: Secondary | ICD-10-CM

## 2018-11-06 HISTORY — PX: CARDIOVERSION: SHX1299

## 2018-11-06 LAB — BASIC METABOLIC PANEL
Anion gap: 12 (ref 5–15)
BUN: 113 mg/dL — ABNORMAL HIGH (ref 8–23)
CO2: 25 mmol/L (ref 22–32)
Calcium: 9.4 mg/dL (ref 8.9–10.3)
Chloride: 95 mmol/L — ABNORMAL LOW (ref 98–111)
Creatinine, Ser: 2.36 mg/dL — ABNORMAL HIGH (ref 0.44–1.00)
GFR calc Af Amer: 22 mL/min — ABNORMAL LOW (ref >60)
GFR calc non Af Amer: 19 mL/min — ABNORMAL LOW (ref >60)
Glucose, Bld: 112 mg/dL — ABNORMAL HIGH (ref 70–99)
Potassium: 5.1 mmol/L (ref 3.5–5.1)
Sodium: 132 mmol/L — ABNORMAL LOW (ref 135–145)

## 2018-11-06 LAB — CBC WITH DIFFERENTIAL/PLATELET
Abs Immature Granulocytes: 0.02 K/uL (ref 0.00–0.07)
Basophils Absolute: 0.1 K/uL (ref 0.0–0.1)
Basophils Relative: 1 %
Eosinophils Absolute: 0.1 K/uL (ref 0.0–0.5)
Eosinophils Relative: 2 %
HCT: 36.1 % (ref 36.0–46.0)
Hemoglobin: 11.3 g/dL — ABNORMAL LOW (ref 12.0–15.0)
Immature Granulocytes: 1 %
Lymphocytes Relative: 8 %
Lymphs Abs: 0.3 K/uL — ABNORMAL LOW (ref 0.7–4.0)
MCH: 28.6 pg (ref 26.0–34.0)
MCHC: 31.3 g/dL (ref 30.0–36.0)
MCV: 91.4 fL (ref 80.0–100.0)
Monocytes Absolute: 0.7 K/uL (ref 0.1–1.0)
Monocytes Relative: 17 %
Neutro Abs: 3.2 K/uL (ref 1.7–7.7)
Neutrophils Relative %: 71 %
Platelets: 112 K/uL — ABNORMAL LOW (ref 150–400)
RBC: 3.95 MIL/uL (ref 3.87–5.11)
RDW: 17.8 % — ABNORMAL HIGH (ref 11.5–15.5)
WBC: 4.4 K/uL (ref 4.0–10.5)
nRBC: 0 % (ref 0.0–0.2)

## 2018-11-06 LAB — PROTIME-INR
INR: 2.4 — ABNORMAL HIGH (ref 0.8–1.2)
Prothrombin Time: 25.8 seconds — ABNORMAL HIGH (ref 11.4–15.2)

## 2018-11-06 SURGERY — CARDIOVERSION
Anesthesia: General

## 2018-11-06 MED ORDER — SODIUM CHLORIDE 0.9% FLUSH: 3.0000 mL | INTRAVENOUS | Status: DC | PRN

## 2018-11-06 MED ORDER — SODIUM CHLORIDE 0.9 % IV SOLN: 250.0000 mL | INTRAVENOUS | Status: DC

## 2018-11-06 MED ORDER — METOPROLOL TARTRATE 25 MG PO TABS
12.5000 mg | ORAL_TABLET | Freq: Two times a day (BID) | ORAL | Status: DC
  Administered 2018-11-06: 14:00:00 12.5 mg via ORAL
  Filled 2018-11-06: qty 1

## 2018-11-06 MED ORDER — PROPOFOL 10 MG/ML IV BOLUS
INTRAVENOUS | Status: DC | PRN
  Administered 2018-11-06: 40 mg via INTRAVENOUS

## 2018-11-06 MED ORDER — FUROSEMIDE 10 MG/ML IJ SOLN
40.0000 mg | Freq: Every day | INTRAMUSCULAR | Status: DC
  Administered 2018-11-06 – 2018-11-08 (×3): 40 mg via INTRAVENOUS
  Filled 2018-11-06 (×3): qty 4

## 2018-11-06 MED ORDER — SODIUM CHLORIDE 0.9% FLUSH
3.0000 mL | Freq: Two times a day (BID) | INTRAVENOUS | Status: DC
  Administered 2018-11-06 – 2018-11-08 (×4): 3 mL via INTRAVENOUS

## 2018-11-06 MED ORDER — CARVEDILOL 3.125 MG PO TABS
3.1250 mg | ORAL_TABLET | Freq: Two times a day (BID) | ORAL | Status: DC
  Administered 2018-11-07 – 2018-11-08 (×3): 3.125 mg via ORAL
  Filled 2018-11-06 (×4): qty 1

## 2018-11-06 MED ORDER — PROPOFOL 10 MG/ML IV BOLUS
INTRAVENOUS | Status: AC
  Filled 2018-11-06: qty 20

## 2018-11-06 NOTE — Progress Notes (Signed)
Notify Dr. Estanislado Pandy about patient's BP at 94/71, order to discontinue Metoprolol and hold coreg dose tonight. Also notify Dr. Rockey Situ about patient's BP. RN will continue to monitor.

## 2018-11-06 NOTE — Progress Notes (Signed)
Arrived at patients room, and the patient was not there, patient already gone down for procedure

## 2018-11-06 NOTE — Plan of Care (Signed)

## 2018-11-06 NOTE — Telephone Encounter (Signed)
Gerald Stabs, are you and Dr. Rockey Situ seeing her today? See message below.

## 2018-11-06 NOTE — Anesthesia Post-op Follow-up Note (Signed)
Anesthesia QCDR form completed.        

## 2018-11-06 NOTE — Transfer of Care (Signed)
Immediate Anesthesia Transfer of Care Note  Patient: [name]  Procedure(s) Performed: CARDIOVERSION (N/A )  Patient Location: PACU  Anesthesia Type:General  Level of Consciousness: drowsy  Airway & Oxygen Therapy: Patient Spontanous Breathing and Patient connected to nasal cannula oxygen  Post-op Assessment: Report given to RN and Post -op Vital signs reviewed and stable  Post vital signs: Reviewed and stable  Last Vitals:  Vitals Value Taken Time  BP 99/78 11/06/2018  8:05 AM  Temp    Pulse 62 11/06/2018  8:10 AM  Resp 17 11/06/2018  8:10 AM  SpO2 93 % 11/06/2018  8:10 AM  Vitals shown include unvalidated device data.  Last Pain:  Vitals:   11/06/18 0749  TempSrc: Oral  PainSc: 8       Patients Stated Pain Goal: 0 (41/59/73 3125)  Complications: No apparent anesthesia complications

## 2018-11-06 NOTE — Telephone Encounter (Signed)
I have talked to the daughter

## 2018-11-06 NOTE — Care Management Important Message (Signed)
Important Message  Patient Details  Name: [name] MRN: [id number] Date of Birth: Mar 31, 1935   Medicare Important Message Given:  Yes    [name] 11/06/2018, 11:49 AM

## 2018-11-06 NOTE — CV Procedure (Addendum)
Cardioversion procedure note For atrial tachycardia, persistent 108 bpm  Procedure Details:  Consent: Risks of procedure as well as the alternatives and risks of each were explained to the (patient/caregiver). Consent for procedure obtained.  Time Out: Verified patient identification, verified procedure, site/side was marked, verified correct patient position, special equipment/implants available, medications/allergies/relevent history reviewed, required imaging and test results available. Performed  Patient placed on cardiac monitor, pulse oximetry, supplemental oxygen as necessary.  Sedation given: propofol IV, Dr. Randa Lynn Pacer pads placed anterior and posterior chest.   Cardioverted 1 time(s).  Cardioverted at  120 J. Synchronized biphasic Converted to NSR   Evaluation: Findings: Post procedure EKG shows: NSR, rate 68 bpm Complications: None Patient did tolerate procedure well.  Time Spent Directly with the Patient:  73 minutes   Esmond Plants, M.D., Ph.D.

## 2018-11-06 NOTE — Progress Notes (Signed)
[place] [facility] [place] NAME: [name]    MR#:  [id number]  DATE OF BIRTH:  21-Feb-1935  SUBJECTIVE:  CHIEF COMPLAINT:   Chief Complaint  Patient presents with  . Shortness of Breath  . Leg Swelling  Patient seen and evaluated today Currently on oxygen via nasal cannula Decreased shortness of breath No complaints of chest pain No fever Status post cardioversion today  REVIEW OF SYSTEMS:    ROS  CONSTITUTIONAL: No documented fever. No fatigue, weakness. No weight gain, no weight loss.  EYES: No blurry or double vision.  ENT: No tinnitus. No postnasal drip. No redness of the oropharynx.  RESPIRATORY: No cough, no wheeze, no hemoptysis. Has decreased dyspnea.  CARDIOVASCULAR: No chest pain. No orthopnea. No palpitations. No syncope.  GASTROINTESTINAL: No nausea, no vomiting or diarrhea. No abdominal pain. No melena or hematochezia.  GENITOURINARY: No dysuria or hematuria.  ENDOCRINE: No polyuria or nocturia. No heat or cold intolerance.  HEMATOLOGY: No anemia. No bruising. No bleeding.  INTEGUMENTARY: No rashes. No lesions.  MUSCULOSKELETAL: No arthritis. No swelling. No gout.  NEUROLOGIC: No numbness, tingling, or ataxia. No seizure-type activity.  PSYCHIATRIC: No anxiety. No insomnia. No ADD.   DRUG ALLERGIES:   Allergies  Allergen Reactions  . Ace Inhibitors     Cough  . Doxycycline Nausea Only  . Hydrocodone Itching and Nausea Only  . Mercury     Other reaction(s): Unknown  . Silver Dermatitis    Severe itching  . Cephalexin Rash  . Clindamycin/Lincomycin Rash  . Nickel Rash  . Penicillins Rash    Has patient had a PCN reaction causing immediate rash, facial/tongue/throat swelling, SOB or lightheadedness with hypotension: YES Has patient had a PCN reaction causing severe rash involving mucus membranes or skin necrosis: NO Has patient had a PCN reaction that required hospitalization NO Has patient had a PCN  reaction occurring within the last 10 years: NO If all of the above answers are "NO", then may proceed with Cephalosporin use.    VITALS:  Blood pressure 108/60, pulse 62, temperature 97.8 F (36.6 C), temperature source Oral, resp. rate 17, height 4\' 11"  (1.499 m), weight 67.5 kg, SpO2 98 %.  PHYSICAL EXAMINATION:   Physical Exam  GENERAL:  84 y.o.-year-old patient lying in the bed with no acute distress.  EYES: Pupils equal, round, reactive to light and accommodation. No scleral icterus. Extraocular muscles intact.  HEENT: Head atraumatic, normocephalic. Oropharynx and nasopharynx clear.  NECK:  Supple, no jugular venous distention. No thyroid enlargement, no tenderness.  LUNGS: Improved breath sounds bilaterally, decreased bilateral crepitations, . No use of accessory muscles of respiration.  CARDIOVASCULAR: S1, S2 normal. No murmurs, rubs, or gallops.  ABDOMEN: Soft, nontender, nondistended. Bowel sounds present. No organomegaly or mass.  EXTREMITIES: No cyanosis, clubbing or edema b/l.    NEUROLOGIC: Cranial nerves II through XII are intact. No focal Motor or sensory deficits b/l.   PSYCHIATRIC: The patient is alert and oriented x 3.  SKIN: No obvious rash, lesion, or ulcer.   LABORATORY PANEL:   CBC Recent Labs  Lab 11/06/18 0446  WBC 4.4  HGB 11.3*  HCT 36.1  PLT 112*   ------------------------------------------------------------------------------------------------------------------ Chemistries  Recent Labs  Lab 11/04/18 1141  11/06/18 0446  NA 130*   < > 132*  K 4.6   < > 5.1  CL 93*   < > 95*  CO2 25   < > 25  GLUCOSE 100*   < >  112*  BUN 113*   < > 113*  CREATININE 3.05*   < > 2.36*  CALCIUM 9.4   < > 9.4  AST 25  --   --   ALT 25  --   --   ALKPHOS 84  --   --   BILITOT 1.1  --   --    < > = values in this interval not displayed.    ------------------------------------------------------------------------------------------------------------------  Cardiac Enzymes Recent Labs  Lab 11/04/18 1141  TROPONINI 0.03*   ------------------------------------------------------------------------------------------------------------------  RADIOLOGY:  No results found.   ASSESSMENT AND PLAN:   84 yr old female patient with a known history of chronic atrial fibrillation on Coumadin, combined systolic diastolic heart failure with EF of 25%, GERD, hypertension, lymphedema, mitral valve replacement on Coumadin presented to the emergency room for shortness of breath and swelling in the legs.  Patient has gained the weight for the last couple of days to presentation to the hospital.   -Acute on chronic systolic and diastolic heart failure exacerbation Improving Start Lasix IV 40 mg daily Daily input output chart Daily body weight Cardiology consult appreciated  -Chronic atrial fibrillation Continue Coumadin for anticoagulation Continue oral amiodarone Status post cardioversion Low-dose beta-blocker will be added  -GERD : PPI  -DVT prophylaxis On anticoagulation with Coumadin  -[employer] device check requested for today afternoon  -Acute on chronic kidney disease stage III Improved Diuretics on hold Avoid nephrotoxic medication Monitor renal function  -Possible DC in a.m. if okay by cardiology  All the records are reviewed and case discussed with Care Management/Social Worker. Management plans discussed with the patient, family and they are in agreement.  CODE STATUS: Full code  DVT Prophylaxis: SCDs  TOTAL TIME TAKING CARE OF THIS PATIENT: 36 minutes.   POSSIBLE D/C IN 2 to 3 DAYS, DEPENDING ON CLINICAL CONDITION.  Saundra Shelling M.D on 11/06/2018 at 12:54 PM  Between 7am to 6pm - Pager - [phone]  After 6pm go to [email or web address] - password [email or web address] [place] [facility]  Office   [phone]  CC: Primary care physician; Rusty Aus, MD  Note: This dictation was prepared with Dragon dictation along with smaller phrase technology. Any transcriptional errors that result from this process are unintentional.

## 2018-11-06 NOTE — TOC Initial Note (Addendum)
Transition of Care [facility]) - Initial/Assessment Note    Patient Details  Name: [name] MRN: [id number] Date of Birth: [birth year]  Transition of Care [facility]) CM/SW Contact:    Elza Rafter, RN Phone Number: 11/06/2018, 4:55 PM  Clinical Narrative:     Patient is from home; daughter lives with her but works full time in [place].  Current with PCP-Dr. Sabra Heck.  Obtains medications at [place] without difficulty.  Uses a walker; DME O2 at 3L through [employer].  Has been to HF Clinic before.  Has a private caregiver from 8-12 5 days a week.  Offered HH services and she is refusing at this time.  Will continue to assist with DC planning as needed.  No transportation issues.  Has a functioning scale at home and weighs daily.       Geoffry Paradise, BSN, RN Care Manager [phone]         Expected Discharge Plan: Home/Self Care Barriers to Discharge: Continued Medical Work up   Patient Goals and CMS Choice        Expected Discharge Plan and Services Expected Discharge Plan: Home/Self Care   Discharge Planning Services: HF Clinic, CM Consult   Living arrangements for the past 2 months: Single Family Home Expected Discharge Date: 11/07/18                         [facility] Arranged: Patient Refused HH          Prior Living Arrangements/Services Living arrangements for the past 2 months: Single Family Home Lives with:: Adult Children Patient language and need for interpreter reviewed:: Yes Do you feel safe going back to the place where you live?: Yes        Care giver support system in place?: Yes (comment)(private aide)   Criminal Activity/Legal Involvement Pertinent to Current Situation/Hospitalization: No - Comment as needed  Activities of Daily Living Home Assistive Devices/Equipment: Environmental consultant (specify type), Oxygen, Eyeglasses ADL Screening (condition at time of admission) Patient's cognitive ability adequate to safely complete daily activities?:  Yes Is the patient deaf or have difficulty hearing?: No Does the patient have difficulty seeing, even when wearing glasses/contacts?: Yes Does the patient have difficulty concentrating, remembering, or making decisions?: No Patient able to express need for assistance with ADLs?: Yes Does the patient have difficulty dressing or bathing?: Yes Independently performs ADLs?: Yes (appropriate for developmental age) Does the patient have difficulty walking or climbing stairs?: Yes Weakness of Legs: Both Weakness of Arms/Hands: None  Permission Sought/Granted                  Emotional Assessment Appearance:: Appears stated age Attitude/Demeanor/Rapport: Gracious Affect (typically observed): Accepting Orientation: : Oriented to Self, Oriented to Place, Oriented to  Time, Oriented to Situation Alcohol / Substance Use: Not Applicable    Admission diagnosis:  SOB (shortness of breath) [R06.02] Patient Active Problem List   Diagnosis Date Noted  . Acute exacerbation of CHF (congestive heart failure) ([place]) 11/04/2018  . Pulmonary hypertension, unspecified ([place]) 11/04/2018  . S/P MVR (mitral valve replacement) 11/04/2018  . Encounter for therapeutic drug monitoring 09/09/2017  . Encounter for anticoagulation discussion and counseling 09/09/2017  . Paroxysmal atrial fibrillation ([place]) 02/25/2017  . Chronic venous insufficiency 10/27/2016  . Shortness of breath 08/14/2016  . Pressure injury of skin 04/12/2016  . Malnutrition of moderate degree 04/11/2016  . Vertebral compression fracture ([place]) 04/10/2016  . Intractable pain 04/10/2016  . Spinal compression  fracture ([place]) 04/10/2016  . Chronic systolic heart failure ([place]) 11/19/2015  . Lymphedema   . CKD (chronic kidney disease), stage III ([place])   . Bilateral lower extremity edema 08/30/2015  . Acute on chronic renal failure ([place]) 04/28/2015  . Hyponatremia 04/28/2015  . H/O mitral valve replacement with mechanical valve 11/04/2014   . Long term current use of anticoagulant therapy 09/28/2014  . 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 Generalized OA 11/28/2013  . HLD (hyperlipidemia) 11/28/2013   PCP:  Rusty Aus, MD Pharmacy:   [place], [place] - [address] [place] [address] Phone: [phone] Fax: [phone]     Social Determinants of Health (SDOH) Interventions    Readmission Risk Interventions No flowsheet data found.

## 2018-11-06 NOTE — Anesthesia Postprocedure Evaluation (Signed)
Anesthesia Post Note  Patient: [name]  Procedure(s) Performed: CARDIOVERSION (N/A )  Patient location during evaluation: Other (specials recovery) Anesthesia Type: General Level of consciousness: awake and alert and oriented Pain management: pain level controlled Vital Signs Assessment: post-procedure vital signs reviewed and stable Respiratory status: spontaneous breathing, nonlabored ventilation and respiratory function stable Cardiovascular status: blood pressure returned to baseline and stable Postop Assessment: no signs of nausea or vomiting Anesthetic complications: no     Last Vitals:  Vitals:   11/06/18 0845 11/06/18 0900  BP: 110/63 108/60  Pulse: (!) 59 62  Resp: 17   Temp:    SpO2: 98% 98%    Last Pain:  Vitals:   11/06/18 1057  TempSrc:   PainSc: 8                  Nyeemah Jennette

## 2018-11-06 NOTE — Progress Notes (Signed)
Patient remains clinically stable post cardioversion per Dr Rockey Situ, atrial paced on monitor. Patient alert and oriented post procedure. Denies complaints. Vitals remain stable. Report called to [facility] RN with questions answered.

## 2018-11-06 NOTE — Telephone Encounter (Signed)
Patient admitted and daughter wants update from [place] before weekend.  Please call.

## 2018-11-06 NOTE — Progress Notes (Signed)
Progress Note  Patient Name: [name] Date of Encounter: 11/06/2018  Primary Cardiologist: Ida Rogue, MD   Subjective   Chronic shortness of breath lying in bed, no acute changes Long steady decline over the past year with abdominal bloating, leg swelling Very inactive at home  Successful cardioversion for atrial tachycardia this morning, rate back into the 60s Prior to that was more than 100 on a consistent basis  Echocardiogram discussed with her, decline in ejection fraction down to 25% but in the setting of atrial tachycardia also with moderate pulmonary hypertension and left pleural effusion  Inpatient Medications    Scheduled Meds: . [MAR Hold] acidophilus  1 capsule Oral Daily  . [MAR Hold] amiodarone  200 mg Oral Daily  . [MAR Hold] aspirin EC  81 mg Oral Daily  . [MAR Hold] cholecalciferol  2,000 Units Oral Daily  . [MAR Hold] mouth rinse  15 mL Mouth Rinse BID  . [MAR Hold] sodium chloride flush  3 mL Intravenous Q12H  . sodium chloride flush  3 mL Intravenous Q12H  . [MAR Hold] vitamin C  500 mg Oral Daily  . [MAR Hold] warfarin  3 mg Oral Once per day on Sun Tue Thu Sat  . [MAR Hold] warfarin  6 mg Oral Once per day on Mon Wed Fri  . [MAR Hold] Warfarin - Physician Dosing Inpatient   Does not apply q1800   Continuous Infusions: . [MAR Hold] sodium chloride 250 mL (11/06/18 0617)  . sodium chloride     PRN Meds: [MAR Hold] sodium chloride, [MAR Hold] acetaminophen, [MAR Hold] ALPRAZolam, [MAR Hold] metolazone, [MAR Hold] ondansetron (ZOFRAN) IV, [MAR Hold] sodium chloride flush, sodium chloride flush   Vital Signs    Vitals:   11/06/18 0807 11/06/18 0808 11/06/18 0809 11/06/18 0820  BP:    103/69  Pulse: 63 64 62 62  Resp: 18 18 19 20   Temp:      TempSrc:      SpO2: 91% 92% 93% 95%  Weight:      Height:        Intake/Output Summary (Last 24 hours) at 11/06/2018 0837 Last data filed at 11/06/2018 0631 Gross per 24 hour  Intake -  Output  500 ml  Net -500 ml   Filed Weights   11/04/18 1650 11/06/18 0417 11/06/18 0749  Weight: 69.3 kg 67.6 kg 67.5 kg    Telemetry    Paced, Regular, tachycardic, concerning for atrial tachycardia with rates consistently in 100s even on ambulation- Personally Reviewed  ECG    No new tracings - Personally Reviewed  Physical Exam   GEN: No acute distress.   Neck: JVP 9-10. 3L [place] Cardiac: Regular rate and rhythm 1/6 systolic murmur; no rubs or gallops.  Respiratory: Clear to auscultation bilaterally. GI: Soft, nontender, non-distended  MS: 1+ b/l piting edema; No deformity. Neuro:  Nonfocal  Psych: Normal affect   Labs    Chemistry Recent Labs  Lab 11/04/18 1141 11/05/18 0441 11/06/18 0446  NA 130* 130* 132*  K 4.6 4.3 5.1  CL 93* 93* 95*  CO2 25 23 25   GLUCOSE 100* 102* 112*  BUN 113* 118* 113*  CREATININE 3.05* 2.79* 2.36*  CALCIUM 9.4 9.0 9.4  PROT 7.1  --   --   ALBUMIN 4.3  4.4  --   --   AST 25  --   --   ALT 25  --   --   ALKPHOS 84  --   --  BILITOT 1.1  --   --   GFRNONAA 14* 15* 19*  GFRAA 16* 18* 22*  ANIONGAP 12 14 12      Hematology Recent Labs  Lab 11/04/18 1141 11/06/18 0446  WBC 5.4 4.4  RBC 4.06 3.95  HGB 11.3* 11.3*  HCT 36.3 36.1  MCV 89.4 91.4  MCH 27.8 28.6  MCHC 31.1 31.3  RDW 18.1* 17.8*  PLT 146* 112*    Cardiac Enzymes Recent Labs  Lab 11/04/18 1141  TROPONINI 0.03*   No results for input(s): TROPIPOC in the last 168 hours.   BNP Recent Labs  Lab 11/03/18 1314 11/04/18 1141  BNP 496.0* 505.0*     DDimer No results for input(s): DDIMER in the last 168 hours.   Radiology    Dg Chest Port 1 View  Result Date: 11/04/2018 CLINICAL DATA:  Shortness of breath and bilateral lower extremity edema developing over the past few days. EXAM: PORTABLE CHEST 1 VIEW COMPARISON:  PA and lateral chest 10/10/2015. FINDINGS: The patient is status post mitral valve repair. There is cardiomegaly and pulmonary edema. No pneumothorax.  Likely small pleural effusions noted. No acute bony abnormality. IMPRESSION: Cardiomegaly and interstitial edema. There are likely small bilateral pleural effusions. Electronically Signed   By: Inge Rise M.D.   On: 11/04/2018 12:47    Cardiac Studies   Pending echo   TTE 06/17/2018 MODERATE LV DYSFUNCTION (See above)   MILD RV SYSTOLIC DYSFUNCTION (See above)   VALVULAR REGURGITATION: MILD TR   VALVULAR STENOSIS: MILD AS   PROSTHETIC VALVE(S): MECH PROSTHETIC MV   ESTIMATED RVSP= 50 mmHg   MILD - MODERATE TRICUSPID REGURGITATION  TTE 09/01/2017 Left ventricle: The cavity size was normal. There was mild concentric hypertrophy. Systolic function was moderately reduced. The estimated ejection fraction was in the range of 35% to 40%. Images were inadequate for LV wall motion assessment. The study is not technically sufficient to allow evaluation of LV diastolic function. - Aortic valve: There was mild to moderate stenosis. Mean gradient (S): 10 mm Hg. Valve area (VTI): 1.17 cm^2. - Mitral valve: A mechanical prosthesis was present and functioning normally. Mean gradient (D): 3 mm Hg. - Left atrium: The atrium was mildly dilated. - Pulmonary arteries: Systolic pressure was mildly increased. PA peak pressure: 40 mm Hg (S).   Patient Profile     84 y.o. female with a hx of chronic Afib s/p DCCV 09/2015 while on amiodarone, CRT-P 11/2017 / biv PPM in the setting of LBBB (Dr. Coy Saunas), combined systolic diastolic HF with EF 40-34% (08/2017, 05/2018), GERD, HTN, lymphedema on compression pumps, mitral valve replacement with [employer] mechanical valve on Coumadin, renal insufficiency, former smoker, vertebral fractures with kyphoplasty and who is being seen today for the evaluation of acute on chronic combined HF at the request of Dr. Estanislado Pandy.  Assessment & Plan    Acute on chronic renal failure Secondary to aggressive diuresis As outpatient, likely component of  ATN Diuretics held for 2 days with improvement of renal function now creatinine 2.36  pulmonary hypertension  Seen 31 May 2018 and again on echocardiogram yesterday, right heart pressures at least 50 mmHg Likely exacerbated by atrial tachycardia rate in the 100s And ventricularly paced rhythm QRS 178 has CRT 2019 but QRS did not narrow, Prior optimization in 7425,  --Systolic and diastolic dysfunction -Mild mitral valve gradient, aortic valve stenosis -Underlying lung disease, prior 3 pack a day smoker ------ likely explains left pleural effusion on echo ----We will likely  need to stay in the hospital for optimization of her left heart failure/systolic dysfunction  Chronic diastolic and systolic CHF Prior ejection fraction 40% Echo yesterday ejection fraction 25% in the setting of atrial tachycardia Creatinine improving after holding Lasix 2 days, likely component of ATN , was on metolazone daily ----Challenging management of her systolic dysfunction given hypotension Will avoid ACE inhibitors ARB, Entresto in the setting of renal failure --We will consider restarting low-dose carvedilol, Lasix IV  Paroxysmal atrial fibrillation/flutter No recent fibrillation or flutter Cardioversion this morning for atrial tachycardia We will refer back to Dr. Coy Saunas at [facility]    Total encounter time more than 25 minutes  Greater than 50% was spent in counseling and coordination of care with the patient   For questions or updates, please contact [facility] Please consult [email or web address] for contact info under        Signed, Ida Rogue, MD  11/06/2018, 8:37 AM

## 2018-11-06 NOTE — Anesthesia Preprocedure Evaluation (Signed)
Anesthesia Evaluation  Patient identified by MRN, date of birth, ID band Patient awake    Reviewed: Allergy & Precautions, NPO status , Patient's Chart, lab work & pertinent test results  History of Anesthesia Complications Negative for: history of anesthetic complications  Airway Mallampati: II  TM Distance: >3 FB Neck ROM: Full    Dental  (+) Poor Dentition, Missing   Pulmonary shortness of breath (wears 3L o2 continuously at home) and Long-Term Oxygen Therapy, neg sleep apnea, neg COPD, former smoker,    breath sounds clear to auscultation- rhonchi (-) wheezing      Cardiovascular hypertension, Pt. on medications +CHF  (-) CAD, (-) Past MI, (-) Cardiac Stents and (-) CABG + dysrhythmias (w/ RVR) Atrial Fibrillation + pacemaker + Valvular Problems/Murmurs (s/p MVR)  Rhythm:Regular Rate:Normal - Systolic murmurs and - Diastolic murmurs Echo 12/04/04:  1. The left ventricle has severely reduced systolic function, with an ejection fraction of 25-30%. The cavity size was mildly dilated. Left ventricular diastolic Doppler parameters are consistent with impaired relaxation. Left ventrical global  hypokinesis without regional wall motion abnormalities.  2. The right ventricle has mildly reduced systolic function. The cavity was moderately enlarged. There is no increase in right ventricular wall thickness. Right ventricular systolic pressure is moderately elevated with an estimated pressure of 53 mmHg.  3. Right atrial size was moderately dilated.  4. Left atrial size was moderately dilated.  5. The aortic valve was not well visualized. Moderate calcification of the aortic valve. Aortic valve regurgitation was not assessed by color flow Doppler. Unable to estimate adegree of aortic valve stenosis given severely reduced systolic function.  6. Mechanical mitral valve. Mean gradient 5 mm Hg.  7. Tricuspid valve regurgitation is moderate.  8. Left  pleural effusion noted, 6.5 cm  9. Rhythm is atrial tachycardia, rate 105 bpm   Neuro/Psych neg Seizures negative neurological ROS  negative psych ROS   GI/Hepatic Neg liver ROS, GERD  ,  Endo/Other  negative endocrine ROSneg diabetes  Renal/GU CRFRenal disease     Musculoskeletal  (+) Arthritis ,   Abdominal (+) + obese,   Peds  Hematology negative hematology ROS (+)   Anesthesia Other Findings Past Medical History: No date: Arthritis     Comment:  "back, hands, knees" (04/10/2016) No date: Chronic atrial fibrillation     Comment:  a. On Coumadin No date: Chronic back pain No date: Chronic combined systolic (congestive) and diastolic  (congestive) heart failure (HCC)     Comment:  a. EF 25% by echo in 08/2015 b. RHC in 08/2015 showed               normal filling pressures No date: Chronic kidney disease No date: Compression fracture     Comment:  "several; all in my back" (04/10/2016) No date: GERD (gastroesophageal reflux disease) No date: Hypertension No date: Lymphedema No date: S/P MVR (mitral valve replacement)     Comment:  a. MVR 1994 b. redo MVR in 08/2014 - on Coumadin No date: Shortness of breath dyspnea   Reproductive/Obstetrics                             Anesthesia Physical Anesthesia Plan  ASA: III  Anesthesia Plan: General   Post-op Pain Management:    Induction: Intravenous  PONV Risk Score and Plan: 2 and Propofol infusion  Airway Management Planned: Natural Airway  Additional Equipment:   Intra-op Plan:   Post-operative  Plan:   Informed Consent: I have reviewed the patients History and Physical, chart, labs and discussed the procedure including the risks, benefits and alternatives for the proposed anesthesia with the patient or authorized representative who has indicated his/her understanding and acceptance.     Dental advisory given  Plan Discussed with: CRNA and Anesthesiologist  Anesthesia Plan  Comments:         Anesthesia Quick Evaluation

## 2018-11-07 LAB — BASIC METABOLIC PANEL
Anion gap: 9 (ref 5–15)
BUN: 109 mg/dL — ABNORMAL HIGH (ref 8–23)
CO2: 25 mmol/L (ref 22–32)
Calcium: 9 mg/dL (ref 8.9–10.3)
Chloride: 97 mmol/L — ABNORMAL LOW (ref 98–111)
Creatinine, Ser: 2.35 mg/dL — ABNORMAL HIGH (ref 0.44–1.00)
GFR calc Af Amer: 22 mL/min — ABNORMAL LOW (ref >60)
GFR calc non Af Amer: 19 mL/min — ABNORMAL LOW (ref >60)
Glucose, Bld: 98 mg/dL (ref 70–99)
Potassium: 4.4 mmol/L (ref 3.5–5.1)
Sodium: 131 mmol/L — ABNORMAL LOW (ref 135–145)

## 2018-11-07 LAB — PROTIME-INR
INR: 2.8 — ABNORMAL HIGH (ref 0.8–1.2)
Prothrombin Time: 29 seconds — ABNORMAL HIGH (ref 11.4–15.2)

## 2018-11-07 NOTE — Progress Notes (Signed)
Talked to patient about the importance for her to be assess with her mobility through PT. Refuse to work with PT today, I did encourage her to be up in the chair for dinner, patient agreeable. Also let her know that I talked to PT and ask if they can place her on priority tomorrow for mobility, she verbalized understanding and say she will work with them tomorrow. RN will continue to monitor.

## 2018-11-07 NOTE — Progress Notes (Addendum)
PT Cancellation Note  Patient Details Name: BRANDAN GLAUBER MRN: [id number] DOB: [birth year]   Cancelled Treatment:    Reason Eval/Treat Not Completed: Patient declined, no reason specified.  PT consult received.  Chart reviewed.  Pt firmly declining PT but initially would not state why.  Eventually pt stated that she was in a "bad mood" d/t not going home today.  Attempted to encourage pt to perform OOB mobility/walking with therapist multiple times (including offering to assist pt to toilet and educating pt on benefit/importance of mobility/strengthening) but pt firmly declining PT today.  Nurse notified.  Will re-attempt PT evaluation at a later date/time.  Leitha Bleak, PT 11/07/18, 3:11 PM [phone]  Addendum:  Therapist notified by staff that pt's son requesting more communication regarding pt.  Therapist called pt's son Tashi Andujo) and pt's son provided correct password.  [name] son verbalizing concern regarding pt's functional mobility status for safe discharge home (pt's son and daughter assist pt but also work).  Nurse notified of above information/concerns.  Therapist let pt's son know that therapy would re-attempt to see pt tomorrow; pt's son requesting phone call prior to therapy session and wanted to help facilitate pt participating in therapy (this therapist not working tomorrow but left secure note for covering therapist).    Leitha Bleak, PT 11/07/18, 6:05 PM [phone]

## 2018-11-07 NOTE — Progress Notes (Signed)
Progress Note  Patient Name: [name] Date of Encounter: 11/07/2018  Primary Cardiologist: Ida Rogue, MD   Subjective   84 year old female with a history of atrial fibrillation, combined systolic and diastolic congestive heart failure, status post Saint Jude mechanical mitral valve replacement, chronic renal insufficiency.  She was admitted with progressive renal failure, hyponatremia and shortness of breath.  She is typically followed by Dr. Mina Marble at [facility].  She has her primary care through [facility] internal medicine.  Echocardiogram performed on Nov 05, 2018 reveals severely reduced left ventricular systolic function with an ejection fraction of 25 to 30%.  She has impaired relaxation.  The right ventricle is mildly enlarged.  Estimated PA pressure is 53 mmHg.  There is a mechanical mitral valve with a mean gradient of 5 mmHg.  [name] has had a slow and steady decline over the past year.  She has had lots of abdominal swelling and leg swelling.  She is very inactive at home.  She is had a net diuresis of 1.1 L so far during this admission. Creatinine is fairly stable at 2.35.  This is improved from 3 days ago when the creatinine was 3.05.  Inpatient Medications    Scheduled Meds: . acidophilus  1 capsule Oral Daily  . amiodarone  200 mg Oral Daily  . aspirin EC  81 mg Oral Daily  . carvedilol  3.125 mg Oral BID WC  . cholecalciferol  2,000 Units Oral Daily  . furosemide  40 mg Intravenous Daily  . mouth rinse  15 mL Mouth Rinse BID  . sodium chloride flush  3 mL Intravenous Q12H  . sodium chloride flush  3 mL Intravenous Q12H  . vitamin C  500 mg Oral Daily  . warfarin  3 mg Oral Once per day on Sun Tue Thu Sat  . warfarin  6 mg Oral Once per day on Mon Wed Fri  . Warfarin - Physician Dosing Inpatient   Does not apply q1800   Continuous Infusions: . sodium chloride 250 mL (11/06/18 0617)  . sodium chloride Stopped (11/06/18 1059)   PRN  Meds: sodium chloride, acetaminophen, ALPRAZolam, metolazone, ondansetron (ZOFRAN) IV, sodium chloride flush, sodium chloride flush   Vital Signs    Vitals:   11/06/18 2010 11/07/18 0124 11/07/18 0306 11/07/18 0728  BP: 94/60  99/63 114/64  Pulse: (!) 59  71 61  Resp: 18  16 19   Temp: 97.6 F (36.4 C)  (!) 97.5 F (36.4 C) 97.7 F (36.5 C)  TempSrc: Oral  Oral Oral  SpO2: 98%  98% 100%  Weight:  70.3 kg    Height:        Intake/Output Summary (Last 24 hours) at 11/07/2018 1004 Last data filed at 11/07/2018 0948 Gross per 24 hour  Intake 240 ml  Output 500 ml  Net -260 ml   Last 3 Weights 11/07/2018 11/06/2018 11/06/2018  Weight (lbs) 154 lb 14.4 oz 148 lb 13 oz 149 lb  Weight (kg) 70.262 kg 67.5 kg 67.586 kg  Some encounter information is confidential and restricted. Go to Review Flowsheets activity to see all data.      Telemetry    A/ V pacing  - Personally Reviewed  ECG     - Personally Reviewed  Physical Exam   GEN: chronically ill appearing , elderly female  Neck: No JVD Cardiac: RR , mechanical S1 ,  2/6 systolic murmur   Respiratory:  clear   GI: Soft, nontender, non-distended  MS:  1 + edema ,   erythematous.  Her legs are wrapped. Neuro:  Nonfocal  Psych:  Seems depressed,   Labs    Chemistry Recent Labs  Lab 11/04/18 1141 11/05/18 0441 11/06/18 0446 11/07/18 0540  NA 130* 130* 132* 131*  K 4.6 4.3 5.1 4.4  CL 93* 93* 95* 97*  CO2 25 23 25 25   GLUCOSE 100* 102* 112* 98  BUN 113* 118* 113* 109*  CREATININE 3.05* 2.79* 2.36* 2.35*  CALCIUM 9.4 9.0 9.4 9.0  PROT 7.1  --   --   --   ALBUMIN 4.3  4.4  --   --   --   AST 25  --   --   --   ALT 25  --   --   --   ALKPHOS 84  --   --   --   BILITOT 1.1  --   --   --   GFRNONAA 14* 15* 19* 19*  GFRAA 16* 18* 22* 22*  ANIONGAP 12 14 12 9      Hematology Recent Labs  Lab 11/04/18 1141 11/06/18 0446  WBC 5.4 4.4  RBC 4.06 3.95  HGB 11.3* 11.3*  HCT 36.3 36.1  MCV 89.4 91.4  MCH 27.8 28.6   MCHC 31.1 31.3  RDW 18.1* 17.8*  PLT 146* 112*    Cardiac Enzymes Recent Labs  Lab 11/04/18 1141  TROPONINI 0.03*   No results for input(s): TROPIPOC in the last 168 hours.   BNP Recent Labs  Lab 11/03/18 1314 11/04/18 1141  BNP 496.0* 505.0*     DDimer No results for input(s): DDIMER in the last 168 hours.   Radiology    No results found.  Cardiac Studies     Patient Profile     84 y.o. female with a history of acute on chronic combined systolic and diastolic congestive heart failure, atrial fibrillation, mitral valve replacement.  She was admitted with progressive shortness of breath, renal insufficiency and leg swelling.  Assessment & Plan    1.  Acute on chronic combined systolic and diastolic congestive heart failure.  We will continue IV Lasix for now.  She desperately wants to go home but I explained that she would be able to achieve better diuresis on IV Lasix.  She agreed to stay and will re-assess her tomorrow.  2.  Atrial fibrillation: She was cardioverted on Thursday.  She is maintaining sinus rhythm/V pacing.  3.  Acute on chronic renal insufficiency.  Her renal function has improved slightly.  She is back on IV Lasix.  Continue to follow closely. Creatinine has improved slightly over the past several days.  Her creatinine this morning is 2.35.   For questions or updates, please contact [place] Please consult [email or web address] for contact info under        Signed, Mertie Moores, MD  11/07/2018, 10:04 AM

## 2018-11-07 NOTE — Progress Notes (Addendum)
[place] [facility] [place] NAME: [name]    MR#:  [id number]  DATE OF BIRTH:  1934-10-06  SUBJECTIVE:  Patient states that she would like to go home, Dr. Barnett Hatter cardiology input greatly appreciated, patient agrees to continue IV diuresis with reassessment on tomorrow, patient denies pain/shortness of breath  REVIEW OF SYSTEMS:    ROS  CONSTITUTIONAL: No documented fever. No fatigue, weakness. No weight gain, no weight loss.  EYES: No blurry or double vision.  ENT: No tinnitus. No postnasal drip. No redness of the oropharynx.  RESPIRATORY: No cough, no wheeze, no hemoptysis. Has decreased dyspnea.  CARDIOVASCULAR: No chest pain. No orthopnea. No palpitations. No syncope.  GASTROINTESTINAL: No nausea, no vomiting or diarrhea. No abdominal pain. No melena or hematochezia.  GENITOURINARY: No dysuria or hematuria.  ENDOCRINE: No polyuria or nocturia. No heat or cold intolerance.  HEMATOLOGY: No anemia. No bruising. No bleeding.  INTEGUMENTARY: No rashes. No lesions.  MUSCULOSKELETAL: No arthritis. No swelling. No gout.  NEUROLOGIC: No numbness, tingling, or ataxia. No seizure-type activity.  PSYCHIATRIC: No anxiety. No insomnia. No ADD.   DRUG ALLERGIES:   Allergies  Allergen Reactions  . Ace Inhibitors     Cough  . Doxycycline Nausea Only  . Hydrocodone Itching and Nausea Only  . Mercury     Other reaction(s): Unknown  . Silver Dermatitis    Severe itching  . Cephalexin Rash  . Clindamycin/Lincomycin Rash  . Nickel Rash  . Penicillins Rash    Has patient had a PCN reaction causing immediate rash, facial/tongue/throat swelling, SOB or lightheadedness with hypotension: YES Has patient had a PCN reaction causing severe rash involving mucus membranes or skin necrosis: NO Has patient had a PCN reaction that required hospitalization NO Has patient had a PCN reaction occurring within the last 10 years: NO If all of the above  answers are "NO", then may proceed with Cephalosporin use.    VITALS:  Blood pressure 103/69, pulse 64, temperature 97.7 F (36.5 C), temperature source Oral, resp. rate 19, height 4\' 11"  (1.499 m), weight 70.3 kg, SpO2 100 %.  PHYSICAL EXAMINATION:   Physical Exam  GENERAL:  84 y.o.-year-old patient lying in the bed with no acute distress.  EYES: Pupils equal, round, reactive to light and accommodation. No scleral icterus. Extraocular muscles intact.  HEENT: Head atraumatic, normocephalic. Oropharynx and nasopharynx clear.  NECK:  Supple, no jugular venous distention. No thyroid enlargement, no tenderness.  LUNGS: Improved breath sounds bilaterally, decreased bilateral crepitations, . No use of accessory muscles of respiration.  CARDIOVASCULAR: S1, S2 normal. No murmurs, rubs, or gallops.  ABDOMEN: Soft, nontender, nondistended. Bowel sounds present. No organomegaly or mass.  EXTREMITIES: No cyanosis, clubbing or edema b/l.    NEUROLOGIC: Cranial nerves II through XII are intact. No focal Motor or sensory deficits b/l.   PSYCHIATRIC: The patient is alert and oriented x 3.  SKIN: No obvious rash, lesion, or ulcer.   LABORATORY PANEL:   CBC Recent Labs  Lab 11/06/18 0446  WBC 4.4  HGB 11.3*  HCT 36.1  PLT 112*   ------------------------------------------------------------------------------------------------------------------ Chemistries  Recent Labs  Lab 11/04/18 1141  11/07/18 0540  NA 130*   < > 131*  K 4.6   < > 4.4  CL 93*   < > 97*  CO2 25   < > 25  GLUCOSE 100*   < > 98  BUN 113*   < > 109*  CREATININE 3.05*   < >  2.35*  CALCIUM 9.4   < > 9.0  AST 25  --   --   ALT 25  --   --   ALKPHOS 84  --   --   BILITOT 1.1  --   --    < > = values in this interval not displayed.   ------------------------------------------------------------------------------------------------------------------  Cardiac Enzymes Recent Labs  Lab 11/04/18 1141  TROPONINI 0.03*    ------------------------------------------------------------------------------------------------------------------  RADIOLOGY:  No results found.   ASSESSMENT AND PLAN:  84 yr old female patient with a known history of chronic atrial fibrillation on Coumadin, combined systolic diastolic heart failure with EF of 25%, GERD, hypertension, lymphedema, mitral valve replacement on Coumadin presented to the emergency room for shortness of breath and swelling in the legs.  Patient has gained the weight for the last couple of days to presentation to the hospital.   *Acute on chronic systolic and diastolic heart failure exacerbation Resolving slowly Continue congestive heart failure protocol, IV Lasix, strict I&O monitoring, daily weights, cardiology/Dr. 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 Management plans discussed with the patient, family and they are in agreement.  CODE STATUS: Full code  DVT Prophylaxis: On Coumadin  TOTAL TIME TAKING CARE OF THIS PATIENT: 35 minutes.   POSSIBLE D/C IN 1-2 DAYS, DEPENDING ON CLINICAL CONDITION.  Avel Peace Salary M.D on 82/03/2019 at 12:13 PM  Between 7am to 6pm - Pager - [phone]  After 6pm go to [email or web address] - password [email or web address] [facility]  Office  [phone]  CC: Primary care physician; Rusty Aus, MD  Note: This dictation was prepared with Dragon dictation along with smaller phrase technology. Any transcriptional errors that result  from this process are unintentional.

## 2018-11-07 NOTE — Progress Notes (Signed)
Called and updated son and daughter about the plan of care about the patient.

## 2018-11-08 LAB — BASIC METABOLIC PANEL
Anion gap: 9 (ref 5–15)
BUN: 106 mg/dL — ABNORMAL HIGH (ref 8–23)
CO2: 27 mmol/L (ref 22–32)
Calcium: 9 mg/dL (ref 8.9–10.3)
Chloride: 97 mmol/L — ABNORMAL LOW (ref 98–111)
Creatinine, Ser: 2.41 mg/dL — ABNORMAL HIGH (ref 0.44–1.00)
GFR calc Af Amer: 21 mL/min — ABNORMAL LOW (ref >60)
GFR calc non Af Amer: 18 mL/min — ABNORMAL LOW (ref >60)
Glucose, Bld: 89 mg/dL (ref 70–99)
Potassium: 3.9 mmol/L (ref 3.5–5.1)
Sodium: 133 mmol/L — ABNORMAL LOW (ref 135–145)

## 2018-11-08 LAB — PROTIME-INR
INR: 3.3 — ABNORMAL HIGH (ref 0.8–1.2)
Prothrombin Time: 33.3 seconds — ABNORMAL HIGH (ref 11.4–15.2)

## 2018-11-08 MED ORDER — CARVEDILOL 3.125 MG PO TABS: 3.1250 mg | ORAL_TABLET | Freq: Two times a day (BID) | ORAL | 0 refills | Status: DC

## 2018-11-08 NOTE — Progress Notes (Signed)
Progress Note  Patient Name: [name] Date of Encounter: 11/08/2018  Primary Cardiologist: Carrie Rogue, MD   Subjective   84 year old female with a history of atrial fibrillation, combined systolic and diastolic congestive heart failure, status post Saint Jude mechanical mitral valve replacement, chronic renal insufficiency.  She was admitted with progressive renal failure, hyponatremia and shortness of breath.  She is typically followed by Dr. Mina [name] at [name].  She has her primary care through [name].  Echocardiogram performed on Nov 05, 2018 reveals severely reduced left ventricular systolic function with an ejection fraction of 25 to 30%.  She has impaired relaxation.  The right ventricle is mildly enlarged.  Estimated PA pressure is 53 mmHg.  There is a mechanical mitral valve with a mean gradient of 5 mmHg.  [name] has had a slow and steady decline over the past year.  She has had lots of abdominal swelling and leg swelling.  She is very inactive at home.  She has diuresed 1.9 liters so far this admission Creatinine is up slightly to 2.41 She seems to be feeling better.    Inpatient Medications    Scheduled Meds: . acidophilus  1 capsule Oral Daily  . amiodarone  200 mg Oral Daily  . aspirin EC  81 mg Oral Daily  . carvedilol  3.125 mg Oral BID WC  . cholecalciferol  2,000 Units Oral Daily  . furosemide  40 mg Intravenous Daily  . mouth rinse  15 mL Mouth Rinse BID  . sodium chloride flush  3 mL Intravenous Q12H  . sodium chloride flush  3 mL Intravenous Q12H  . vitamin C  500 mg Oral Daily  . warfarin  3 mg Oral Once per day on Sun Tue Thu Sat  . warfarin  6 mg Oral Once per day on Mon Wed Fri  . Warfarin - Physician Dosing Inpatient   Does not apply q1800   Continuous Infusions: . sodium chloride 250 mL (11/06/18 0617)  . sodium chloride Stopped (11/06/18 1059)   PRN Meds: sodium chloride, acetaminophen, ALPRAZolam,  metolazone, ondansetron (ZOFRAN) IV, sodium chloride flush, sodium chloride flush   Vital Signs    Vitals:   11/07/18 1927 11/08/18 0257 11/08/18 0735 11/08/18 0858  BP: (!) 100/58 106/67 106/61   Pulse: 61 66 (!) 59 67  Resp:   19   Temp: 97.6 F (36.4 C) 98.4 F (36.9 C) 97.8 F (36.6 C)   TempSrc: Oral Oral Oral   SpO2: 99% 96% 99%   Weight:  69.9 kg    Height:        Intake/Output Summary (Last 24 hours) at 11/08/2018 1017 Last data filed at 11/08/2018 1000 Gross per 24 hour  Intake 733.91 ml  Output 1500 ml  Net -766.09 ml   Last 3 Weights 11/08/2018 11/07/2018 11/06/2018  Weight (lbs) 154 lb 154 lb 14.4 oz 148 lb 13 oz  Weight (kg) 69.854 kg 70.262 kg 67.5 kg  Some encounter information is confidential and restricted. Go to Review Flowsheets activity to see all data.      Telemetry    AV pacing - Personally Reviewed  ECG     - Personally Reviewed  Physical Exam   GEN: chronically ill appearing female,  NAD  Neck: No JVD Cardiac: RR ,   Mechanical S1, 2/6 systolic murmur  Respiratory:  clear  GI: Soft, nontender, non-distended  MS:  + edema ,  Legs are wrapped.  Neuro:  Nonfocal  Psych:     Labs    Chemistry Recent Labs  Lab 11/04/18 1141  11/06/18 0446 11/07/18 0540 11/08/18 0500  NA 130*   < > 132* 131* 133*  K 4.6   < > 5.1 4.4 3.9  CL 93*   < > 95* 97* 97*  CO2 25   < > 25 25 27   GLUCOSE 100*   < > 112* 98 89  BUN 113*   < > 113* 109* 106*  CREATININE 3.05*   < > 2.36* 2.35* 2.41*  CALCIUM 9.4   < > 9.4 9.0 9.0  PROT 7.1  --   --   --   --   ALBUMIN 4.3  4.4  --   --   --   --   AST 25  --   --   --   --   ALT 25  --   --   --   --   ALKPHOS 84  --   --   --   --   BILITOT 1.1  --   --   --   --   GFRNONAA 14*   < > 19* 19* 18*  GFRAA 16*   < > 22* 22* 21*  ANIONGAP 12   < > 12 9 9    < > = values in this interval not displayed.     Hematology Recent Labs  Lab 11/04/18 1141 11/06/18 0446  WBC 5.4 4.4  RBC 4.06 3.95  HGB 11.3*  11.3*  HCT 36.3 36.1  MCV 89.4 91.4  MCH 27.8 28.6  MCHC 31.1 31.3  RDW 18.1* 17.8*  PLT 146* 112*    Cardiac Enzymes Recent Labs  Lab 11/04/18 1141  TROPONINI 0.03*   No results for input(s): TROPIPOC in the last 168 hours.   BNP Recent Labs  Lab 11/03/18 1314 11/04/18 1141  BNP 496.0* 505.0*     DDimer No results for input(s): DDIMER in the last 168 hours.   Radiology    No results found.  Cardiac Studies     Patient Profile     84 y.o. female with a history of acute on chronic combined systolic and diastolic congestive heart failure, atrial fibrillation, mitral valve replacement.  She was admitted with progressive shortness of breath, renal insufficiency and leg swelling.  Assessment & Plan    1.  Acute on chronic combined systolic and diastolic congestive heart failure.  Seems to be at / very close to baseline She wants to go home.     Her overall condition appears to be stable ( but at a fairly low level)   2.  Atrial fibrillation:   Was cardioverted several days ago.   Holding sius ( atrial pacing ) for now   3.  Acute on chronic renal insufficiency.   Has CKD stage 3-4  Creatinine seems stable   She seems stable from a cardiac standpoint. She is still very weak but could go home or rehab. She still required significant assistance.    For questions or updates, please contact [name] Please consult [email or web address] for contact info under        Signed, Carrie Moores, MD  11/08/2018, 10:17 AM

## 2018-11-08 NOTE — Progress Notes (Signed)
Received a call from someone claiming to be patient's son. Patient told me this AM she didn't want anyone other than her daughter knowing any of her protected health information. I told this individual the above, he then asked my name and hung up. I tried to arrange the patient to talk to him directly, but apparently that didn't happen. Wenda Low [facility]

## 2018-11-08 NOTE — Evaluation (Signed)
Physical Therapy Evaluation Patient Details Name: [name] MRN: [id number] DOB: 09-27-1934 Today's Date: 11/08/2018   History of Present Illness  84 year old female with a history of atrial fibrillation, combined systolic and diastolic congestive heart failure, status post Saint Jude mechanical mitral valve replacement, chronic renal insufficiency.  She was admitted with progressive renal failure, hyponatremia and shortness of breath. Cardioversion 5/7.    Clinical Impression  Pt alert, agreeable to PT, behavior WFLs, oriented x4. With pt permission, family (daughter [name]) contacted at start and end of session, included in discharge planning discussion as well as providing PLOF information. Pt reported living in two story home with daughter who is available 24/7 for now (will return to work as covid-19 allows). Pt also has a caregiver 5x from 8-12 who assists with ADLs, meals, etc. Pt ambulates with rollator at baseline, denies any recent falls.   Pt demonstrated bed mobility mod I with use of bed rails. Sit <> Stand with RW and CGA/supervision. Ambulated ~252ft with RW, CGA, 3L of O2 via [place], HR and spO2 monitored throughout and WFLs. (HR 70s, spO2 >90%). Pt exhibited slightly flexed trunk, gait WFLs, slightly decreased gait velocity, and pt with some mild observable fatigue at end of ambulation.  Overall the patient demonstrated deficits (see "PT Problem List"), and is nearing baseline level of functioning, but would benefit from skilled PT intervention to maximize safety, independence, and mobility.      Follow Up Recommendations Home health PT;Supervision - Intermittent    Equipment Recommendations  None recommended by PT(Pt has 4WW at home)    Recommendations for Other Services       Precautions / Restrictions Precautions Precautions: Fall Restrictions Weight Bearing Restrictions: No      Mobility  Bed Mobility Overal bed mobility: Needs Assistance Bed Mobility:  Supine to Sit     Supine to sit: Modified independent (Device/Increase time)     General bed mobility comments: use of bed rails  Transfers Overall transfer level: Needs assistance Equipment used: Rolling walker (2 wheeled) Transfers: Sit to/from Stand Sit to Stand: Min guard;Supervision            Ambulation/Gait   Gait Distance (Feet): 200 Feet Assistive device: Rolling walker (2 wheeled) Gait Pattern/deviations: WFL(Within Functional Limits)   Gait velocity interpretation: 1.31 - 2.62 ft/sec, indicative of limited community ambulator General Gait Details: slightly flexed trunk, overall WFLs  Stairs            Wheelchair Mobility    Modified Rankin (Stroke Patients Only)       Balance Overall balance assessment: Needs assistance Sitting-balance support: Feet supported Sitting balance-Leahy Scale: Good       Standing balance-Leahy Scale: Fair                               Pertinent Vitals/Pain Pain Assessment: No/denies pain    Home Living Family/patient expects to be discharged to:: Private residence Living Arrangements: Children Available Help at Discharge: Family;Available PRN/intermittently;Personal care attendant Type of Home: House Home Access: Stairs to enter;Ramped entrance     Home Layout: Two level;1/2 bath on main level Home Equipment: Walker - 4 wheels;Toilet riser;Grab bars - toilet;Grab bars - tub/shower;Shower seat - built in      Prior Function Level of Independence: Needs assistance   Gait / Transfers Assistance Needed: uses rollator at baseline for household mobility  ADL's / Homemaking Assistance Needed: an aide comes 5x a week,  8-12 to assist with ADLs  Comments: denies falls in the last 6 months     Hand Dominance   Dominant Hand: Right    Extremity/Trunk Assessment   Upper Extremity Assessment Upper Extremity Assessment: Generalized weakness    Lower Extremity Assessment Lower Extremity  Assessment: Generalized weakness       Communication   Communication: No difficulties  Cognition Arousal/Alertness: Awake/alert Behavior During Therapy: WFL for tasks assessed/performed Overall Cognitive Status: Within Functional Limits for tasks assessed                                        General Comments      Exercises     Assessment/Plan    PT Assessment Patient needs continued PT services  PT Problem List Decreased strength;Decreased mobility;Decreased activity tolerance;Decreased balance       PT Treatment Interventions Functional mobility training;DME instruction;Balance training;Patient/family education;Gait training;Therapeutic activities;Neuromuscular re-education;Stair training;Therapeutic exercise    PT Goals (Current goals can be found in the Care Plan section)  Acute Rehab PT Goals Patient Stated Goal: to go home PT Goal Formulation: With patient Time For Goal Achievement: 11/22/18 Potential to Achieve Goals: Good    Frequency Min 2X/week   Barriers to discharge        Co-evaluation               AM-PAC PT "6 Clicks" Mobility  Outcome Measure Help needed turning from your back to your side while in a flat bed without using bedrails?: None Help needed moving from lying on your back to sitting on the side of a flat bed without using bedrails?: A Little Help needed moving to and from a bed to a chair (including a wheelchair)?: A Little Help needed standing up from a chair using your arms (e.g., wheelchair or bedside chair)?: A Little Help needed to walk in hospital room?: A Little Help needed climbing 3-5 steps with a railing? : A Lot 6 Click Score: 18    End of Session Equipment Utilized During Treatment: Gait belt;Oxygen(3L) Activity Tolerance: Patient tolerated treatment well Patient left: in chair;with chair alarm set;with call bell/phone within reach Nurse Communication: Mobility status PT Visit Diagnosis: Muscle  weakness (generalized) (M62.81);Other abnormalities of gait and mobility (R26.89);Difficulty in walking, not elsewhere classified (R26.2)    Time: 1103-1594 PT Time Calculation (min) (ACUTE ONLY): 27 min   Charges:   PT Evaluation $PT Eval Moderate Complexity: 1 Mod PT Treatments $Therapeutic Activity: 8-22 mins       Lieutenant Diego PT, DPT 1:20 PM,11/08/18 [phone]

## 2018-11-08 NOTE — Progress Notes (Addendum)
[place] [facility] [place] NAME: [name]    MR#:  [id number]  DATE OF BIRTH:  1934-11-09  SUBJECTIVE:  Patient states that she would like to go home, Dr. Barnett Hatter cardiology followoing, family/daughter would like her to stay until tomorrow, patient denies pain/shortness of breath  REVIEW OF SYSTEMS:    ROS  CONSTITUTIONAL: No documented fever. No fatigue, weakness. No weight gain, no weight loss.  EYES: No blurry or double vision.  ENT: No tinnitus. No postnasal drip. No redness of the oropharynx.  RESPIRATORY: No cough, no wheeze, no hemoptysis. Has decreased dyspnea.  CARDIOVASCULAR: No chest pain. No orthopnea. No palpitations. No syncope.  GASTROINTESTINAL: No nausea, no vomiting or diarrhea. No abdominal pain. No melena or hematochezia.  GENITOURINARY: No dysuria or hematuria.  ENDOCRINE: No polyuria or nocturia. No heat or cold intolerance.  HEMATOLOGY: No anemia. No bruising. No bleeding.  INTEGUMENTARY: No rashes. No lesions.  MUSCULOSKELETAL: No arthritis. No swelling. No gout.  NEUROLOGIC: No numbness, tingling, or ataxia. No seizure-type activity.  PSYCHIATRIC: No anxiety. No insomnia. No ADD.   DRUG ALLERGIES:   Allergies  Allergen Reactions  . Ace Inhibitors     Cough  . Doxycycline Nausea Only  . Hydrocodone Itching and Nausea Only  . Mercury     Other reaction(s): Unknown  . Silver Dermatitis    Severe itching  . Cephalexin Rash  . Clindamycin/Lincomycin Rash  . Nickel Rash  . Penicillins Rash    Has patient had a PCN reaction causing immediate rash, facial/tongue/throat swelling, SOB or lightheadedness with hypotension: YES Has patient had a PCN reaction causing severe rash involving mucus membranes or skin necrosis: NO Has patient had a PCN reaction that required hospitalization NO Has patient had a PCN reaction occurring within the last 10 years: NO If all of the above answers are "NO", then may  proceed with Cephalosporin use.    VITALS:  Blood pressure 106/61, pulse 67, temperature 97.8 F (36.6 C), temperature source Oral, resp. rate 19, height 4\' 11"  (1.499 m), weight 69.9 kg, SpO2 99 %.  PHYSICAL EXAMINATION:   Physical Exam  GENERAL:  84 y.o.-year-old patient lying in the bed with no acute distress.  EYES: Pupils equal, round, reactive to light and accommodation. No scleral icterus. Extraocular muscles intact.  HEENT: Head atraumatic, normocephalic. Oropharynx and nasopharynx clear.  NECK:  Supple, no jugular venous distention. No thyroid enlargement, no tenderness.  LUNGS: Improved breath sounds bilaterally, decreased bilateral crepitations, . No use of accessory muscles of respiration.  CARDIOVASCULAR: S1, S2 normal. No murmurs, rubs, or gallops.  ABDOMEN: Soft, nontender, nondistended. Bowel sounds present. No organomegaly or mass.  EXTREMITIES: No cyanosis, clubbing or edema b/l.    NEUROLOGIC: Cranial nerves II through XII are intact. No focal Motor or sensory deficits b/l.   PSYCHIATRIC: The patient is alert and oriented x 3.  SKIN: No obvious rash, lesion, or ulcer.   LABORATORY PANEL:   CBC Recent Labs  Lab 11/06/18 0446  WBC 4.4  HGB 11.3*  HCT 36.1  PLT 112*   ------------------------------------------------------------------------------------------------------------------ Chemistries  Recent Labs  Lab 11/04/18 1141  11/08/18 0500  NA 130*   < > 133*  K 4.6   < > 3.9  CL 93*   < > 97*  CO2 25   < > 27  GLUCOSE 100*   < > 89  BUN 113*   < > 106*  CREATININE 3.05*   < > 2.41*  CALCIUM 9.4   < > 9.0  AST 25  --   --   ALT 25  --   --   ALKPHOS 84  --   --   BILITOT 1.1  --   --    < > = values in this interval not displayed.   ------------------------------------------------------------------------------------------------------------------  Cardiac Enzymes Recent Labs  Lab 11/04/18 1141  TROPONINI 0.03*    ------------------------------------------------------------------------------------------------------------------  RADIOLOGY:  No results found.   ASSESSMENT AND PLAN:  84 yr old female patient with a known history of chronic atrial fibrillation on Coumadin, combined systolic diastolic heart failure with EF of 25%, GERD, hypertension, lymphedema, mitral valve replacement on Coumadin presented to the emergency room for shortness of breath and swelling in the legs.  Patient has gained the weight for the last couple of days to presentation to the hospital.   *Acute on chronic systolic and diastolic heart failure exacerbation Resolving slowly Continue CHF protocol, IV Lasix, strict I&O monitoring, daily weights, cardiology/Dr. 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 Management plans discussed with the patient, family and they are in agreement.  CODE STATUS: Full code  DVT Prophylaxis: On Coumadin  TOTAL TIME TAKING CARE OF THIS PATIENT: 35 minutes.   POSSIBLE D/C IN 1 DAYS, DEPENDING ON CLINICAL CONDITION.  [name] M.D on 11/08/2018 at 10:52 AM  Between 7am to 6pm - Pager - [phone]  After 6pm go to [email or web address] - password [email or web address]  Office   [phone]  CC: Primary care physician; Carrie Aus, MD  Note: This dictation was prepared with Dragon dictation along with smaller phrase technology. Any transcriptional errors that result from this process are unintentional.

## 2018-11-08 NOTE — TOC Transition Note (Signed)
Transition of Care [facility]) - CM/SW Discharge Note   Patient Details  Name: [name] MRN: [id number] Date of Birth: Feb 27, 1935  Transition of Care [facility]) CM/SW Contact:  Latanya Maudlin, RN Phone Number: 11/08/2018, 4:18 PM   Clinical Narrative: Patient to be discharged per MD order. Orders in place for home health services. Patient was previously refusing home health but is now agreeable .CMS Medicare.gov Compare Post Acute Care list reviewed with patient and she has used [facility] and prefers to use them again. Notified Jason of referral. No DME needs.     Final next level of care: Home w Home Health Services Barriers to Discharge: No Barriers Identified   Patient Goals and CMS Choice        Discharge Placement                       Discharge Plan and Services   Discharge Planning Services: HF Clinic, CM Consult                      HH Arranged: RN, PT [facility] Agency: [name] ([place]) Date [place]: 11/08/18 Time [place]: 1618 Representative spoke with at [place]: [place] ([place]) Interventions     Readmission Risk Interventions No flowsheet data found.

## 2018-11-08 NOTE — Discharge Summary (Signed)
[facility] NAME: [name]    MR#:  [id number]  DATE OF BIRTH:  [birth year]  DATE OF ADMISSION:  11/04/2018 ADMITTING PHYSICIAN: Saundra Shelling, MD  DATE OF DISCHARGE: No discharge date for patient encounter.  PRIMARY CARE PHYSICIAN: Rusty Aus, MD    ADMISSION DIAGNOSIS:  SOB (shortness of breath) [R06.02]  DISCHARGE DIAGNOSIS:  Principal Problem:   Acute on chronic renal failure (HCC) Active Problems:   H/O mitral valve replacement with mechanical valve   Hyponatremia   Bilateral lower extremity edema   Acute exacerbation of CHF (congestive heart failure) (HCC)   Pulmonary hypertension, unspecified (HCC)   S/P MVR (mitral valve replacement)   SECONDARY DIAGNOSIS:   Past Medical History:  Diagnosis Date  . Arthritis    "back, hands, knees" (04/10/2016)  . Chronic atrial fibrillation    a. On Coumadin  . Chronic back pain   . 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 EF 25% by echo in 08/2015 b. RHC in 08/2015 showed normal filling pressures  . Chronic kidney disease   . Compression fracture    "several; all in my back" (04/10/2016)  . GERD (gastroesophageal reflux disease)   . Hypertension   . Lymphedema   . S/P MVR (mitral valve replacement)    a. MVR 1994 b. redo MVR in 08/2014 - on Coumadin  . Shortness of breath dyspnea     HOSPITAL COURSE:  Acute CHF ex Resolved Treated on our CHF protocol, continue coreg, lasix, to f/u in CHF clinic s/p discharge for re-evaluation  Chronic Afib S/p CV Continue amiodarone, coreg, coumadin  GERD PPI  Acute on CKD IV/V Resolved  PT recommended HHPT s/p discharge  DISCHARGE CONDITIONS:  stable  CONSULTS OBTAINED:  Treatment Team:  Minna Merritts, MD  DRUG ALLERGIES:   Allergies  Allergen Reactions  . Ace Inhibitors     Cough  . Doxycycline Nausea Only  . Hydrocodone Itching and  Nausea Only  . Mercury     Other reaction(s): Unknown  . Silver Dermatitis    Severe itching  . Cephalexin Rash  . Clindamycin/Lincomycin Rash  . Nickel Rash  . Penicillins Rash    Has patient had a PCN reaction causing immediate rash, facial/tongue/throat swelling, SOB or lightheadedness with hypotension: YES Has patient had a PCN reaction causing severe rash involving mucus membranes or skin necrosis: NO Has patient had a PCN reaction that required hospitalization NO Has patient had a PCN reaction occurring within the last 10 years: NO If all of the above answers are "NO", then may proceed with Cephalosporin use.    DISCHARGE MEDICATIONS:   Allergies as of 11/08/2018      Reactions   Ace Inhibitors    Cough   Doxycycline Nausea Only   Hydrocodone Itching, Nausea Only   Mercury    Other reaction(s): Unknown   Silver Dermatitis   Severe itching   Cephalexin Rash   Clindamycin/lincomycin Rash   Nickel Rash   Penicillins Rash   Has patient had a PCN reaction causing immediate rash, facial/tongue/throat swelling, SOB or lightheadedness with hypotension: YES Has patient had a PCN reaction causing severe rash involving mucus membranes or skin necrosis: NO Has patient had a PCN reaction that required hospitalization NO Has patient had a PCN reaction occurring within the last 10 years: NO If all of the above answers are "NO", then may proceed with  Cephalosporin use.      Medication List    TAKE these medications   acetaminophen 650 MG CR tablet Commonly known as:  TYLENOL Take 650 mg by mouth every 8 (eight) hours as needed for pain.   ALPRAZolam 0.25 MG tablet Commonly known as:  XANAX Take 0.25 mg by mouth at bedtime as needed for sleep.   amiodarone 200 MG tablet Commonly known as:  PACERONE Take 1 tablet (200 mg total) by mouth daily.   aspirin EC 81 MG tablet Take 81 mg by mouth daily.   carvedilol 3.125 MG tablet Commonly known as:  COREG Take 1 tablet (3.125  mg total) by mouth 2 (two) times daily with a meal.   Icy Hot 5 % Ptch Generic drug:  Menthol Apply 1 patch topically daily as needed (pain).   Klor-Con 10 10 MEQ tablet Generic drug:  potassium chloride Take 10 mEq by mouth 2 (two) times daily.   Lasix 20 MG tablet Generic drug:  furosemide TAKE ONE TABLET BY MOUTH 3 TIMES DAILY What changed:  See the new instructions.   losartan 25 MG tablet Commonly known as:  COZAAR TAKE 1/2 TABLET EVERY DAY   metolazone 5 MG tablet Commonly known as:  ZAROXOLYN Take 5 mg by mouth daily as needed (severe fluid retention).   RISA-BID PROBIOTIC PO Take 1 capsule by mouth daily.   spironolactone 25 MG tablet Commonly known as:  ALDACTONE Take 25 mg by mouth daily.   vitamin C 500 MG tablet Commonly known as:  ASCORBIC ACID Take 500 mg by mouth daily.   Vitamin D 50 MCG (2000 UT) tablet Take 2,000 Units by mouth daily.   warfarin 6 MG tablet Commonly known as:  COUMADIN Take as directed. If you are unsure how to take this medication, talk to your nurse or doctor. Original instructions:  Take 3-6 mg by mouth See admin instructions. Take 1 tablet (6mg ) by mouth every Monday, Wednesday, Friday evening and take  tablet (3mg ) by mouth every Tuesday, Thursday, Saturday and Sunday evening        DISCHARGE INSTRUCTIONS:    If you experience worsening of your admission symptoms, develop shortness of breath, life threatening emergency, suicidal or homicidal thoughts you must seek medical attention immediately by calling 911 or calling your MD immediately  if symptoms less severe.  You Must read complete instructions/literature along with all the possible adverse reactions/side effects for all the Medicines you take and that have been prescribed to you. Take any new Medicines after you have completely understood and accept all the possible adverse reactions/side effects.   Please note  You were cared for by a hospitalist during your  hospital stay. If you have any questions about your discharge medications or the care you received while you were in the hospital after you are discharged, you can call the unit and asked to speak with the hospitalist on call if the hospitalist that took care of you is not available. Once you are discharged, your primary care physician will handle any further medical issues. Please note that NO REFILLS for any discharge medications will be authorized once you are discharged, as it is imperative that you return to your primary care physician (or establish a relationship with a primary care physician if you do not have one) for your aftercare needs so that they can reassess your need for medications and monitor your lab values.    Today   CHIEF COMPLAINT:   Chief Complaint  Patient presents with  . Shortness of Breath  . Leg Swelling    HISTORY OF PRESENT ILLNESS:  [name]  is a 84 y.o. female presenting with sob due to acute on chronic CHF ex, compounded by afib.   VITAL SIGNS:  Blood pressure 106/61, pulse 67, temperature 97.8 F (36.6 C), temperature source Oral, resp. rate 19, height 4\' 11"  (1.499 m), weight 69.9 kg, SpO2 99 %.  I/O:    Intake/Output Summary (Last 24 hours) at 11/08/2018 1446 Last data filed at 11/08/2018 1344 Gross per 24 hour  Intake 733.91 ml  Output 1850 ml  Net -1116.09 ml    PHYSICAL EXAMINATION:  GENERAL:  84 y.o.-year-old patient lying in the bed with no acute distress.  EYES: Pupils equal, round, reactive to light and accommodation. No scleral icterus. Extraocular muscles intact.  HEENT: Head atraumatic, normocephalic. Oropharynx and nasopharynx clear.  NECK:  Supple, no jugular venous distention. No thyroid enlargement, no tenderness.  LUNGS: Normal breath sounds bilaterally, no wheezing, rales,rhonchi or crepitation. No use of accessory muscles of respiration.  CARDIOVASCULAR: S1, S2 normal. No murmurs, rubs, or gallops.  ABDOMEN: Soft,  non-tender, non-distended. Bowel sounds present. No organomegaly or mass.  EXTREMITIES: No pedal edema, cyanosis, or clubbing.  NEUROLOGIC: Cranial nerves II through XII are intact. Muscle strength 5/5 in all extremities. Sensation intact. Gait not checked.  PSYCHIATRIC: The patient is alert and oriented x 3.  SKIN: No obvious rash, lesion, or ulcer.   DATA REVIEW:   CBC Recent Labs  Lab 11/06/18 0446  WBC 4.4  HGB 11.3*  HCT 36.1  PLT 112*    Chemistries  Recent Labs  Lab 11/04/18 1141  11/08/18 0500  NA 130*   < > 133*  K 4.6   < > 3.9  CL 93*   < > 97*  CO2 25   < > 27  GLUCOSE 100*   < > 89  BUN 113*   < > 106*  CREATININE 3.05*   < > 2.41*  CALCIUM 9.4   < > 9.0  AST 25  --   --   ALT 25  --   --   ALKPHOS 84  --   --   BILITOT 1.1  --   --    < > = values in this interval not displayed.    Cardiac Enzymes Recent Labs  Lab 11/04/18 1141  TROPONINI 0.03*    Microbiology Results  Results for orders placed or performed during the hospital encounter of 11/04/18  SARS Coronavirus 2 (CEPHEID - Performed in [facility] hospital lab), Hosp Order     Status: None   Collection Time: 11/04/18  2:53 PM  Result Value Ref Range Status   SARS Coronavirus 2 NEGATIVE NEGATIVE Final    Comment: (NOTE) If result is NEGATIVE SARS-CoV-2 target nucleic acids are NOT DETECTED. The SARS-CoV-2 RNA is generally detectable in upper and lower  respiratory specimens during the acute phase of infection. The lowest  concentration of SARS-CoV-2 viral copies this assay can detect is 250  copies / mL. A negative result does not preclude SARS-CoV-2 infection  and should not be used as the sole basis for treatment or other  patient management decisions.  A negative result may occur with  improper specimen collection / handling, submission of specimen other  than nasopharyngeal swab, presence of viral mutation(s) within the  areas targeted by this assay, and inadequate number of viral  copies  (<250 copies / mL).  A negative result must be combined with clinical  observations, patient history, and epidemiological information. If result is POSITIVE SARS-CoV-2 target nucleic acids are DETECTED. The SARS-CoV-2 RNA is generally detectable in upper and lower  respiratory specimens dur ing the acute phase of infection.  Positive  results are indicative of active infection with SARS-CoV-2.  Clinical  correlation with patient history and other diagnostic information is  necessary to determine patient infection status.  Positive results do  not rule out bacterial infection or co-infection with other viruses. If result is PRESUMPTIVE POSTIVE SARS-CoV-2 nucleic acids MAY BE PRESENT.   A presumptive positive result was obtained on the submitted specimen  and confirmed on repeat testing.  While 2019 novel coronavirus  (SARS-CoV-2) nucleic acids may be present in the submitted sample  additional confirmatory testing may be necessary for epidemiological  and / or clinical management purposes  to differentiate between  SARS-CoV-2 and other Sarbecovirus currently known to infect humans.  If clinically indicated additional testing with an alternate test  methodology [phone]) is advised. The SARS-CoV-2 RNA is generally  detectable in upper and lower respiratory sp ecimens during the acute  phase of infection. The expected result is Negative. Fact Sheet for Patients:  StrictlyIdeas.no Fact Sheet for Healthcare Providers: BankingDealers.co.za This test is not yet approved or cleared by the [place] FDA and has been authorized for detection and/or diagnosis of SARS-CoV-2 by FDA under an Emergency Use Authorization (EUA).  This EUA will remain in effect (meaning this test can be used) for the duration of the COVID-19 declaration under Section 564(b)(1) of the Act, 21 U.S.C. section 360bbb-3(b)(1), unless the authorization is terminated  or revoked sooner. Performed at [facility], [address] [place], [place] [address]     RADIOLOGY:  No results found.  EKG:   Orders placed or performed during the hospital encounter of 11/04/18  . EKG 12-Lead  . EKG 12-Lead      Management plans discussed with the patient, family and they are in agreement.  CODE STATUS: full    Code Status Orders  (From admission, onward)         Start     Ordered   11/04/18 1650  Full code  Continuous     11/04/18 1650        Code Status History    Date Active Date Inactive Code Status Order ID Comments User Context   04/10/2016 2034 04/19/2016 1757 Full Code [id number]  Vianne Bulls, MD Inpatient   09/29/2015 1053 10/10/2015 1406 Full Code [id number]  [name], MD Inpatient   04/28/2015 1626 04/30/2015 1422 Full Code [id number]  Demetrios Loll, MD Inpatient      TOTAL TIME TAKING CARE OF THIS PATIENT: 45 minutes.    Avel Peace Glennice Marcos M.D on 11/08/2018 at 2:46 PM  Between 7am to 6pm - Pager - [phone]  After 6pm go to [email or web address] - password [email or web address] [facility]  Office  [phone]  CC: Primary care physician; Rusty Aus, MD   Note: This dictation was prepared with Dragon dictation along with smaller phrase technology. Any transcriptional errors that result from this process are unintentional.

## 2018-11-08 NOTE — Discharge Instructions (Signed)
Shortness of Breath, Adult  Shortness of breath is when a person has trouble breathing enough air or when a person feels like she or he is having trouble breathing in enough air. Shortness of breath could be a sign of a medical problem.  Follow these instructions at home:     Pay attention to any changes in your symptoms.   Do not use any products that contain nicotine or tobacco, such as cigarettes, e-cigarettes, and chewing tobacco.   Do not smoke. Smoking is a common cause of shortness of breath. If you need help quitting, ask your health care provider.   Avoid things that can irritate your airways, such as:  ? Mold.  ? Dust.  ? Air pollution.  ? Chemical fumes.  ? Things that can cause allergy symptoms (allergens), if you have allergies.   Keep your living space clean and free of mold and dust.   Rest as needed. Slowly return to your usual activities.   Take over-the-counter and prescription medicines only as told by your health care provider. This includes oxygen therapy and inhaled medicines.   Keep all follow-up visits as told by your health care provider. This is important.  Contact a health care provider if:   Your condition does not improve as soon as expected.   You have a hard time doing your normal activities, even after you rest.   You have new symptoms.  Get help right away if:   Your shortness of breath gets worse.   You have shortness of breath when you are resting.   You feel light-headed or you faint.   You have a cough that is not controlled with medicines.   You cough up blood.   You have pain with breathing.   You have pain in your chest, arms, shoulders, or abdomen.   You have a fever.   You cannot walk up stairs or exercise the way that you normally do.  These symptoms may represent a serious problem that is an emergency. Do not wait to see if the symptoms will go away. Get medical help right away. Call your local emergency services (911 in the [place] Do not drive yourself  to the hospital.  Summary   Shortness of breath is when a person has trouble breathing enough air. It can be a sign of a medical problem.   Avoid things that irritate your lungs, such as smoking, pollution, mold, and dust.   Pay attention to changes in your symptoms and contact your health care provider if you have a hard time completing daily activities because of shortness of breath.  This information is not intended to replace advice given to you by your health care provider. Make sure you discuss any questions you have with your health care provider.  Document Released: 03/12/2001 Document Revised: 11/17/2017 Document Reviewed: 11/17/2017  Elsevier Interactive Patient Education  2019 [employer]

## 2018-11-09 ENCOUNTER — Inpatient Hospital Stay
Admission: EM | Admit: 2018-11-09 | Discharge: 2018-11-30 | DRG: 570 | Disposition: A | Discharge status: Skilled Nursing Facility | Payer: Medicare Other | Attending: Internal Medicine | Admitting: Internal Medicine

## 2018-11-09 ENCOUNTER — Other Ambulatory Visit: Payer: Self-pay

## 2018-11-09 ENCOUNTER — Encounter: Payer: Self-pay | Admitting: Emergency Medicine

## 2018-11-09 DIAGNOSIS — X58XXXA Exposure to other specified factors, initial encounter: Secondary | ICD-10-CM | POA: Diagnosis present

## 2018-11-09 DIAGNOSIS — Z5329 Procedure and treatment not carried out because of patient's decision for other reasons: Secondary | ICD-10-CM | POA: Diagnosis present

## 2018-11-09 DIAGNOSIS — Z952 Presence of prosthetic heart valve: Secondary | ICD-10-CM

## 2018-11-09 DIAGNOSIS — I5022 Chronic systolic (congestive) heart failure: Secondary | ICD-10-CM | POA: Diagnosis not present

## 2018-11-09 DIAGNOSIS — Z888 Allergy status to other drugs, medicaments and biological substances status: Secondary | ICD-10-CM

## 2018-11-09 DIAGNOSIS — Z950 Presence of cardiac pacemaker: Secondary | ICD-10-CM | POA: Diagnosis not present

## 2018-11-09 DIAGNOSIS — S81802A Unspecified open wound, left lower leg, initial encounter: Secondary | ICD-10-CM | POA: Diagnosis not present

## 2018-11-09 DIAGNOSIS — J9611 Chronic respiratory failure with hypoxia: Secondary | ICD-10-CM | POA: Diagnosis present

## 2018-11-09 DIAGNOSIS — R079 Chest pain, unspecified: Secondary | ICD-10-CM | POA: Diagnosis not present

## 2018-11-09 DIAGNOSIS — Z961 Presence of intraocular lens: Secondary | ICD-10-CM | POA: Diagnosis present

## 2018-11-09 DIAGNOSIS — D649 Anemia, unspecified: Secondary | ICD-10-CM

## 2018-11-09 DIAGNOSIS — I471 Supraventricular tachycardia: Secondary | ICD-10-CM | POA: Diagnosis present

## 2018-11-09 DIAGNOSIS — Z885 Allergy status to narcotic agent status: Secondary | ICD-10-CM | POA: Diagnosis not present

## 2018-11-09 DIAGNOSIS — I420 Dilated cardiomyopathy: Secondary | ICD-10-CM | POA: Diagnosis not present

## 2018-11-09 DIAGNOSIS — I4819 Other persistent atrial fibrillation: Secondary | ICD-10-CM

## 2018-11-09 DIAGNOSIS — T148XXA Other injury of unspecified body region, initial encounter: Secondary | ICD-10-CM

## 2018-11-09 DIAGNOSIS — Z823 Family history of stroke: Secondary | ICD-10-CM

## 2018-11-09 DIAGNOSIS — M549 Dorsalgia, unspecified: Secondary | ICD-10-CM | POA: Diagnosis present

## 2018-11-09 DIAGNOSIS — Z7901 Long term (current) use of anticoagulants: Secondary | ICD-10-CM | POA: Diagnosis not present

## 2018-11-09 DIAGNOSIS — K219 Gastro-esophageal reflux disease without esophagitis: Secondary | ICD-10-CM | POA: Diagnosis present

## 2018-11-09 DIAGNOSIS — Z9981 Dependence on supplemental oxygen: Secondary | ICD-10-CM

## 2018-11-09 DIAGNOSIS — Z9841 Cataract extraction status, right eye: Secondary | ICD-10-CM

## 2018-11-09 DIAGNOSIS — R52 Pain, unspecified: Secondary | ICD-10-CM

## 2018-11-09 DIAGNOSIS — F05 Delirium due to known physiological condition: Secondary | ICD-10-CM | POA: Diagnosis not present

## 2018-11-09 DIAGNOSIS — I350 Nonrheumatic aortic (valve) stenosis: Secondary | ICD-10-CM | POA: Diagnosis present

## 2018-11-09 DIAGNOSIS — I5023 Acute on chronic systolic (congestive) heart failure: Secondary | ICD-10-CM

## 2018-11-09 DIAGNOSIS — G8929 Other chronic pain: Secondary | ICD-10-CM | POA: Diagnosis present

## 2018-11-09 DIAGNOSIS — M810 Age-related osteoporosis without current pathological fracture: Secondary | ICD-10-CM | POA: Diagnosis present

## 2018-11-09 DIAGNOSIS — S8012XA Contusion of left lower leg, initial encounter: Secondary | ICD-10-CM | POA: Diagnosis present

## 2018-11-09 DIAGNOSIS — Z881 Allergy status to other antibiotic agents status: Secondary | ICD-10-CM

## 2018-11-09 DIAGNOSIS — N183 Chronic kidney disease, stage 3 (moderate): Secondary | ICD-10-CM | POA: Diagnosis not present

## 2018-11-09 DIAGNOSIS — D62 Acute posthemorrhagic anemia: Secondary | ICD-10-CM | POA: Diagnosis present

## 2018-11-09 DIAGNOSIS — I5043 Acute on chronic combined systolic (congestive) and diastolic (congestive) heart failure: Secondary | ICD-10-CM | POA: Diagnosis not present

## 2018-11-09 DIAGNOSIS — Z79899 Other long term (current) drug therapy: Secondary | ICD-10-CM

## 2018-11-09 DIAGNOSIS — Z841 Family history of disorders of kidney and ureter: Secondary | ICD-10-CM

## 2018-11-09 DIAGNOSIS — I890 Lymphedema, not elsewhere classified: Secondary | ICD-10-CM | POA: Diagnosis present

## 2018-11-09 DIAGNOSIS — E876 Hypokalemia: Secondary | ICD-10-CM | POA: Diagnosis not present

## 2018-11-09 DIAGNOSIS — I959 Hypotension, unspecified: Secondary | ICD-10-CM | POA: Diagnosis present

## 2018-11-09 DIAGNOSIS — D72829 Elevated white blood cell count, unspecified: Secondary | ICD-10-CM | POA: Diagnosis not present

## 2018-11-09 DIAGNOSIS — T45515A Adverse effect of anticoagulants, initial encounter: Secondary | ICD-10-CM | POA: Diagnosis present

## 2018-11-09 DIAGNOSIS — I480 Paroxysmal atrial fibrillation: Secondary | ICD-10-CM | POA: Diagnosis not present

## 2018-11-09 DIAGNOSIS — Z880 Allergy status to penicillin: Secondary | ICD-10-CM | POA: Diagnosis not present

## 2018-11-09 DIAGNOSIS — J449 Chronic obstructive pulmonary disease, unspecified: Secondary | ICD-10-CM | POA: Diagnosis present

## 2018-11-09 DIAGNOSIS — S81812D Laceration without foreign body, left lower leg, subsequent encounter: Secondary | ICD-10-CM | POA: Diagnosis not present

## 2018-11-09 DIAGNOSIS — R0600 Dyspnea, unspecified: Secondary | ICD-10-CM

## 2018-11-09 DIAGNOSIS — R0789 Other chest pain: Secondary | ICD-10-CM | POA: Diagnosis not present

## 2018-11-09 DIAGNOSIS — I130 Hypertensive heart and chronic kidney disease with heart failure and stage 1 through stage 4 chronic kidney disease, or unspecified chronic kidney disease: Secondary | ICD-10-CM | POA: Diagnosis present

## 2018-11-09 DIAGNOSIS — Z1159 Encounter for screening for other viral diseases: Secondary | ICD-10-CM | POA: Diagnosis not present

## 2018-11-09 DIAGNOSIS — I272 Pulmonary hypertension, unspecified: Secondary | ICD-10-CM | POA: Diagnosis present

## 2018-11-09 DIAGNOSIS — Z87891 Personal history of nicotine dependence: Secondary | ICD-10-CM

## 2018-11-09 DIAGNOSIS — I441 Atrioventricular block, second degree: Secondary | ICD-10-CM | POA: Diagnosis present

## 2018-11-09 DIAGNOSIS — N184 Chronic kidney disease, stage 4 (severe): Secondary | ICD-10-CM

## 2018-11-09 DIAGNOSIS — K5900 Constipation, unspecified: Secondary | ICD-10-CM | POA: Diagnosis present

## 2018-11-09 DIAGNOSIS — I5042 Chronic combined systolic (congestive) and diastolic (congestive) heart failure: Secondary | ICD-10-CM | POA: Diagnosis not present

## 2018-11-09 DIAGNOSIS — Z7982 Long term (current) use of aspirin: Secondary | ICD-10-CM

## 2018-11-09 LAB — BASIC METABOLIC PANEL
Anion gap: 7 (ref 5–15)
BUN: 100 mg/dL — ABNORMAL HIGH (ref 8–23)
CO2: 28 mmol/L (ref 22–32)
Calcium: 8.3 mg/dL — ABNORMAL LOW (ref 8.9–10.3)
Chloride: 100 mmol/L (ref 98–111)
Creatinine, Ser: 2.19 mg/dL — ABNORMAL HIGH (ref 0.44–1.00)
GFR calc Af Amer: 24 mL/min — ABNORMAL LOW (ref >60)
GFR calc non Af Amer: 20 mL/min — ABNORMAL LOW (ref >60)
Glucose, Bld: 116 mg/dL — ABNORMAL HIGH (ref 70–99)
Potassium: 3.9 mmol/L (ref 3.5–5.1)
Sodium: 135 mmol/L (ref 135–145)

## 2018-11-09 LAB — CBC WITH DIFFERENTIAL/PLATELET
Abs Immature Granulocytes: 0.01 10*3/uL (ref 0.00–0.07)
Basophils Absolute: 0.1 10*3/uL (ref 0.0–0.1)
Basophils Relative: 1 %
Eosinophils Absolute: 0.2 10*3/uL (ref 0.0–0.5)
Eosinophils Relative: 4 %
HCT: 28.9 % — ABNORMAL LOW (ref 36.0–46.0)
Hemoglobin: 8.9 g/dL — ABNORMAL LOW (ref 12.0–15.0)
Immature Granulocytes: 0 %
Lymphocytes Relative: 8 %
Lymphs Abs: 0.4 10*3/uL — ABNORMAL LOW (ref 0.7–4.0)
MCH: 28 pg (ref 26.0–34.0)
MCHC: 30.8 g/dL (ref 30.0–36.0)
MCV: 90.9 fL (ref 80.0–100.0)
Monocytes Absolute: 0.8 10*3/uL (ref 0.1–1.0)
Monocytes Relative: 16 %
Neutro Abs: 3.7 10*3/uL (ref 1.7–7.7)
Neutrophils Relative %: 71 %
Platelets: 117 10*3/uL — ABNORMAL LOW (ref 150–400)
RBC: 3.18 MIL/uL — ABNORMAL LOW (ref 3.87–5.11)
RDW: 17.5 % — ABNORMAL HIGH (ref 11.5–15.5)
WBC: 5.3 10*3/uL (ref 4.0–10.5)
nRBC: 0 % (ref 0.0–0.2)

## 2018-11-09 LAB — CBC
HCT: 28.3 % — ABNORMAL LOW (ref 36.0–46.0)
Hemoglobin: 9 g/dL — ABNORMAL LOW (ref 12.0–15.0)
MCH: 28.4 pg (ref 26.0–34.0)
MCHC: 31.8 g/dL (ref 30.0–36.0)
MCV: 89.3 fL (ref 80.0–100.0)
Platelets: 107 10*3/uL — ABNORMAL LOW (ref 150–400)
RBC: 3.17 MIL/uL — ABNORMAL LOW (ref 3.87–5.11)
RDW: 17.2 % — ABNORMAL HIGH (ref 11.5–15.5)
WBC: 6.1 10*3/uL (ref 4.0–10.5)
nRBC: 0 % (ref 0.0–0.2)

## 2018-11-09 LAB — URINALYSIS, COMPLETE (UACMP) WITH MICROSCOPIC
Bilirubin Urine: NEGATIVE
Glucose, UA: NEGATIVE mg/dL
Hgb urine dipstick: NEGATIVE
Ketones, ur: NEGATIVE mg/dL
Leukocytes,Ua: NEGATIVE
Nitrite: NEGATIVE
Protein, ur: NEGATIVE mg/dL
Specific Gravity, Urine: 1.012 (ref 1.005–1.030)
WBC, UA: NONE SEEN WBC/hpf (ref 0–5)
pH: 5 (ref 5.0–8.0)

## 2018-11-09 LAB — COMPREHENSIVE METABOLIC PANEL
ALT: 18 U/L (ref 0–44)
AST: 18 U/L (ref 15–41)
Albumin: 3.6 g/dL (ref 3.5–5.0)
Alkaline Phosphatase: 61 U/L (ref 38–126)
Anion gap: 7 (ref 5–15)
BUN: 101 mg/dL — ABNORMAL HIGH (ref 8–23)
CO2: 27 mmol/L (ref 22–32)
Calcium: 8.5 mg/dL — ABNORMAL LOW (ref 8.9–10.3)
Chloride: 100 mmol/L (ref 98–111)
Creatinine, Ser: 2.38 mg/dL — ABNORMAL HIGH (ref 0.44–1.00)
GFR calc Af Amer: 21 mL/min — ABNORMAL LOW (ref >60)
GFR calc non Af Amer: 19 mL/min — ABNORMAL LOW (ref >60)
Glucose, Bld: 129 mg/dL — ABNORMAL HIGH (ref 70–99)
Potassium: 3.8 mmol/L (ref 3.5–5.1)
Sodium: 134 mmol/L — ABNORMAL LOW (ref 135–145)
Total Bilirubin: 0.7 mg/dL (ref 0.3–1.2)
Total Protein: 6 g/dL — ABNORMAL LOW (ref 6.5–8.1)

## 2018-11-09 LAB — ABO/RH: ABO/RH(D): A POS

## 2018-11-09 LAB — PROTIME-INR
INR: 3.5 — ABNORMAL HIGH (ref 0.8–1.2)
INR: 3.2 — ABNORMAL HIGH (ref 0.8–1.2)
Prothrombin Time: 34.7 seconds — ABNORMAL HIGH (ref 11.4–15.2)
Prothrombin Time: 32.3 seconds — ABNORMAL HIGH (ref 11.4–15.2)

## 2018-11-09 LAB — PREPARE RBC (CROSSMATCH)

## 2018-11-09 LAB — APTT
aPTT: 64 seconds — ABNORMAL HIGH (ref 24–36)
aPTT: 53 seconds — ABNORMAL HIGH (ref 24–36)

## 2018-11-09 LAB — HEMOGLOBIN AND HEMATOCRIT, BLOOD
HCT: 25.5 % — ABNORMAL LOW (ref 36.0–46.0)
Hemoglobin: 8.2 g/dL — ABNORMAL LOW (ref 12.0–15.0)

## 2018-11-09 MED ORDER — ONDANSETRON HCL 4 MG/2ML IJ SOLN
4.0000 mg | Freq: Four times a day (QID) | INTRAMUSCULAR | Status: DC | PRN
  Administered 2018-11-10: 4 mg via INTRAVENOUS
  Filled 2018-11-09: qty 2

## 2018-11-09 MED ORDER — SODIUM CHLORIDE 0.9 % IV SOLN
Freq: Once | INTRAVENOUS | Status: AC
  Administered 2018-11-19: 21:00:00 via INTRAVENOUS

## 2018-11-09 MED ORDER — PROTHROMBIN COMPLEX CONC HUMAN 500 UNITS IV KIT
1500.0000 [IU] | PACK | Freq: Once | Status: DC
  Filled 2018-11-09: qty 1500

## 2018-11-09 MED ORDER — TRAZODONE HCL 50 MG PO TABS: 25.0000 mg | ORAL_TABLET | Freq: Every evening | ORAL | Status: DC | PRN

## 2018-11-09 MED ORDER — SODIUM CHLORIDE 0.9% FLUSH
3.0000 mL | Freq: Two times a day (BID) | INTRAVENOUS | Status: DC
  Administered 2018-11-09 – 2018-11-30 (×34): 3 mL via INTRAVENOUS

## 2018-11-09 MED ORDER — ACETAMINOPHEN 325 MG PO TABS
650.0000 mg | ORAL_TABLET | Freq: Four times a day (QID) | ORAL | Status: DC | PRN
  Administered 2018-11-09 – 2018-11-30 (×39): 650 mg via ORAL
  Filled 2018-11-09 (×38): qty 2

## 2018-11-09 MED ORDER — AMIODARONE HCL 200 MG PO TABS
200.0000 mg | ORAL_TABLET | Freq: Every day | ORAL | Status: DC
  Administered 2018-11-09 – 2018-11-30 (×20): 200 mg via ORAL
  Filled 2018-11-09 (×20): qty 1

## 2018-11-09 MED ORDER — SODIUM CHLORIDE 0.9% FLUSH
3.0000 mL | INTRAVENOUS | Status: DC | PRN
  Administered 2018-11-13 – 2018-11-28 (×2): 3 mL via INTRAVENOUS
  Filled 2018-11-09 (×2): qty 3

## 2018-11-09 MED ORDER — OXYCODONE HCL 5 MG PO TABS
5.0000 mg | ORAL_TABLET | Freq: Four times a day (QID) | ORAL | Status: DC | PRN
  Administered 2018-11-11 – 2018-11-17 (×5): 5 mg via ORAL
  Filled 2018-11-09 (×5): qty 1

## 2018-11-09 MED ORDER — ACETAMINOPHEN 650 MG RE SUPP: 650.0000 mg | Freq: Four times a day (QID) | RECTAL | Status: DC | PRN

## 2018-11-09 MED ORDER — VITAMIN K1 10 MG/ML IJ SOLN
10.0000 mg | Freq: Once | INTRAVENOUS | Status: DC
  Filled 2018-11-09: qty 1

## 2018-11-09 MED ORDER — ADULT MULTIVITAMIN W/MINERALS CH
1.0000 | ORAL_TABLET | Freq: Every day | ORAL | Status: DC
  Administered 2018-11-09 – 2018-11-30 (×19): 1 via ORAL
  Filled 2018-11-09 (×20): qty 1

## 2018-11-09 MED ORDER — SPIRONOLACTONE 25 MG PO TABS: 25.0000 mg | ORAL_TABLET | Freq: Every day | ORAL | Status: DC

## 2018-11-09 MED ORDER — VITAMIN D3 25 MCG (1000 UNIT) PO TABS
2000.0000 [IU] | ORAL_TABLET | Freq: Every day | ORAL | Status: DC
  Administered 2018-11-09 – 2018-11-30 (×20): 2000 [IU] via ORAL
  Filled 2018-11-09 (×22): qty 2

## 2018-11-09 MED ORDER — POLYETHYLENE GLYCOL 3350 17 G PO PACK
17.0000 g | PACK | Freq: Every day | ORAL | Status: DC | PRN
  Filled 2018-11-09: qty 1

## 2018-11-09 MED ORDER — ACETAMINOPHEN ER 650 MG PO TBCR: 650.0000 mg | EXTENDED_RELEASE_TABLET | Freq: Three times a day (TID) | ORAL | Status: DC | PRN

## 2018-11-09 MED ORDER — ONDANSETRON HCL 4 MG PO TABS: 4.0000 mg | ORAL_TABLET | Freq: Four times a day (QID) | ORAL | Status: DC | PRN

## 2018-11-09 MED ORDER — CARVEDILOL 6.25 MG PO TABS: 3.1250 mg | ORAL_TABLET | Freq: Two times a day (BID) | ORAL | Status: DC

## 2018-11-09 MED ORDER — VITAMIN C 500 MG PO TABS
500.0000 mg | ORAL_TABLET | Freq: Every day | ORAL | Status: DC
  Administered 2018-11-09 – 2018-11-19 (×9): 500 mg via ORAL
  Filled 2018-11-09 (×9): qty 1

## 2018-11-09 MED ORDER — SODIUM CHLORIDE 0.9 % IV SOLN
250.0000 mL | INTRAVENOUS | Status: DC | PRN
  Administered 2018-11-16 – 2018-11-20 (×7): 250 mL via INTRAVENOUS

## 2018-11-09 MED ORDER — TRAMADOL HCL 50 MG PO TABS
50.0000 mg | ORAL_TABLET | Freq: Four times a day (QID) | ORAL | Status: DC | PRN
  Administered 2018-11-09 – 2018-11-12 (×8): 50 mg via ORAL
  Filled 2018-11-09 (×8): qty 1

## 2018-11-09 MED ORDER — POTASSIUM CHLORIDE CRYS ER 10 MEQ PO TBCR
10.0000 meq | EXTENDED_RELEASE_TABLET | Freq: Two times a day (BID) | ORAL | Status: DC
  Administered 2018-11-09 – 2018-11-12 (×7): 10 meq via ORAL
  Filled 2018-11-09 (×8): qty 1

## 2018-11-09 NOTE — ED Notes (Signed)
first call report, Sharyn Lull, RN to call back

## 2018-11-09 NOTE — ED Notes (Signed)
second call report finished att, questions by Sharyn Lull, RN answered att

## 2018-11-09 NOTE — H&P (Addendum)
[facility] NAME: [name]    MR#:  [id number]  DATE OF BIRTH:  1934-09-11  DATE OF ADMISSION:  11/09/2018  PRIMARY CARE PHYSICIAN: Rusty Aus, MD   REQUESTING/REFERRING PHYSICIAN: Nance Pear, MD  CHIEF COMPLAINT:   Chief Complaint  Patient presents with  . Leg Swelling    HISTORY OF PRESENT ILLNESS:  [name]  is a 84 y.o. female with a known history of combined CHF, chronic atrial fibrillation, mitral valve replacement on Coumadin therapy also with history of pacemaker and chronic kidney disease.  She was discharged earlier in the day on 11/08/2018 after being treated for exacerbation CHF.  She returned to the emergency room with ruptured large left lower extremity hematoma.  Patient states she had returned home after being discharged and took a 2-hour nap.  When she woke she noticed severe pain in her left lower extremity with bleeding.  Patient called EMS services and was transported to the emergency room for further evaluation.  On arrival to the emergency room, patient continued to have a large blood loss from the left lower extremity hematoma.  Hemoglobin was 8.9 with hematocrit 28.9 and platelet count 117.  She received 1 unit packed red blood cells.  As previously stated, she is on Coumadin therapy.  Vitamin K and Kcentra are on standby if needed.  Pressure dressing was applied to the left lower extremity with no bleeding at this time.  INR is 3.5.  We have admitted her to the hospital service for further management  PAST MEDICAL HISTORY:   Past Medical History:  Diagnosis Date  . Arthritis    "back, hands, knees" (04/10/2016)  . Chronic atrial fibrillation    a. On Coumadin  . Chronic back pain   . 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 EF 25% by echo in 08/2015 b. RHC in 08/2015 showed normal filling pressures  . Chronic kidney disease   .  Compression fracture    "several; all in my back" (04/10/2016)  . GERD (gastroesophageal reflux disease)   . Hypertension   . Lymphedema   . Pacemaker 05/2017  . S/P MVR (mitral valve replacement)    a. MVR 1994 b. redo MVR in 08/2014 - on Coumadin  . Shortness of breath dyspnea     PAST SURGICAL HISTORY:   Past Surgical History:  Procedure Laterality Date  . BREAST LUMPECTOMY Right 2005?   benign lump excision  . CARDIAC CATHETERIZATION    . [place], 2016   "MVR; MVR"  . CARDIOVERSION N/A 11/06/2018   Procedure: CARDIOVERSION;  Surgeon: Minna Merritts, MD;  Location: [facility] OR[facility];  Service: Cardiovascular;  Laterality: N/A;  . CATARACT EXTRACTION W/PHACO Right 02/20/2015   Procedure: CATARACT EXTRACTION PHACO AND INTRAOCULAR LENS PLACEMENT ([place]);  Surgeon: Estill Cotta, MD;  Location: [facility];  Service: Ophthalmology;  Laterality: Right;  US01:31.2 AP 25.3% CDE40.13 Fluid lot # [id number]  . ELECTROPHYSIOLOGIC STUDY N/A 10/09/2015   Procedure: CARDIOVERSION;  Surgeon: Wellington Hampshire, MD;  Location: [facility];  Service: Cardiovascular;  Laterality: N/A;  . IR GENERIC HISTORICAL  04/01/2016   IR RADIOLOGIST EVAL & MGMT 04/01/2016 [facility]  . IR GENERIC HISTORICAL  04/15/2016   IR VERTEBROPLASTY CERV/THOR BX INC UNI/BIL INC/INJECT/IMAGING 04/15/2016 Luanne Bras, MD [facility]  . IR GENERIC HISTORICAL  04/15/2016   IR VERTEBROPLASTY EA ADDL (T&LS) BX INC UNI/BIL  INC INJECT/IMAGING 04/15/2016 Luanne Bras, MD [facility]  . IR GENERIC HISTORICAL  04/15/2016   IR VERTEBROPLASTY EA ADDL (T&LS) BX INC UNI/BIL INC INJECT/IMAGING 04/15/2016 Luanne Bras, MD [facility]  . IR GENERIC HISTORICAL  05/20/2016   IR RADIOLOGIST EVAL & MGMT 05/20/2016 [facility]  . IR GENERIC HISTORICAL  06/13/2016   IR VERTEBROPLASTY EA ADDL (T&LS) BX INC UNI/BIL INC INJECT/IMAGING 06/13/2016 Luanne Bras, MD [facility]  . IR GENERIC HISTORICAL   06/13/2016   IR SACROPLASTY BILATERAL 06/13/2016 Luanne Bras, MD [facility]  . IR GENERIC HISTORICAL  06/13/2016   IR VERTEBROPLASTY LUMBAR BX INC UNI/BIL INC/INJECT/IMAGING 06/13/2016 Luanne Bras, MD [facility]  . IRRIGATION AND DEBRIDEMENT HEMATOMA Left 07/05/2015   Procedure: IRRIGATION AND DEBRIDEMENT HEMATOMA;  Surgeon: Robert Bellow, MD;  Location: [facility];  Service: General;  Laterality: Left;  . pyloric stenosis  07/1937  . US ECHOCARDIOGRAPHY      SOCIAL HISTORY:   Social History   Tobacco Use  . Smoking status: Former Smoker    Packs/day: 1.00    Years: 27.00    Pack years: 27.00    Types: Cigarettes    Last attempt to quit: 07/01/1980    Years since quitting: 38.3  . Smokeless tobacco: Never Used  . Tobacco comment: "quit smoking ~ 1980  Substance Use Topics  . Alcohol use: Not Currently    Alcohol/week: 0.0 standard drinks    Comment: 04/10/2016 "I'll have a drink on holidays/special occasions"    FAMILY HISTORY:   Family History  Problem Relation Age of Onset  . Stroke Mother   . Renal Disease Father     DRUG ALLERGIES:   Allergies  Allergen Reactions  . Ace Inhibitors     Cough  . Doxycycline Nausea Only  . Hydrocodone Itching and Nausea Only  . Mercury     Other reaction(s): Unknown  . Silver Dermatitis    Severe itching  . Cephalexin Rash  . Clindamycin/Lincomycin Rash  . Nickel Rash  . Penicillins Rash    Has patient had a PCN reaction causing immediate rash, facial/tongue/throat swelling, SOB or lightheadedness with hypotension: YES Has patient had a PCN reaction causing severe rash involving mucus membranes or skin necrosis: NO Has patient had a PCN reaction that required hospitalization NO Has patient had a PCN reaction occurring within the last 10 years: NO If all of the above answers are "NO", then may proceed with Cephalosporin use.    REVIEW OF SYSTEMS:   Review of Systems  Constitutional: Negative for chills,  fever and malaise/fatigue.  HENT: Negative for congestion and sinus pain.   Eyes: Positive for double vision. Negative for blurred vision.  Respiratory: Negative for sputum production, shortness of breath and wheezing.   Cardiovascular: Negative for chest pain, palpitations and PND.  Gastrointestinal: Negative for abdominal pain, blood in stool, constipation, diarrhea, nausea and vomiting.  Genitourinary: Negative for dysuria, flank pain, frequency and hematuria.  Musculoskeletal: Negative for falls and myalgias.  Skin: Negative for itching and rash.       Large left leg hematoma   Neurological: Negative for dizziness, loss of consciousness, weakness and headaches.  Endo/Heme/Allergies: Bruises/bleeds easily.  Psychiatric/Behavioral: Negative.       MEDICATIONS AT HOME:   Prior to Admission medications   Medication Sig Start Date End Date Taking? Authorizing Provider  acetaminophen (TYLENOL) 650 MG CR tablet Take 650 mg by mouth every 8 (eight) hours as needed for pain.   Yes [provider]  ALPRAZolam (XANAX) 0.25 MG tablet Take 0.25 mg by mouth at bedtime as needed for sleep. 10/27/18  Yes [provider]  amiodarone (PACERONE) 200 MG tablet Take 1 tablet (200 mg total) by mouth daily. 09/24/18  Yes Minna Merritts, MD  aspirin EC 81 MG tablet Take 81 mg by mouth daily.   Yes [provider]  carvedilol (COREG) 3.125 MG tablet Take 1 tablet (3.125 mg total) by mouth 2 (two) times daily with a meal. 11/08/18  Yes Salary, Montell D, MD  Cholecalciferol (VITAMIN D) 2000 units tablet Take 2,000 Units by mouth daily.   Yes [provider]  KLOR-CON 10 10 MEQ tablet Take 10 mEq by mouth 2 (two) times daily.    Yes [provider]  LASIX 20 MG tablet TAKE ONE TABLET BY MOUTH 3 TIMES DAILY Patient taking differently: Take 20 mg by mouth 3 (three) times daily.  10/14/18  Yes Gollan, Kathlene November, MD  losartan (COZAAR) 25 MG tablet TAKE 1/2 TABLET EVERY  DAY Patient taking differently: Take 12.5 mg by mouth daily.  09/11/18  Yes Gollan, Kathlene November, MD  Menthol (ICY HOT) 5 % PTCH Apply 1 patch topically daily as needed (pain).   Yes [provider]  metolazone (ZAROXOLYN) 5 MG tablet Take 5 mg by mouth daily as needed (severe fluid retention).   Yes [provider]  Probiotic Product (RISA-BID PROBIOTIC PO) Take 1 capsule by mouth daily.    Yes [provider]  spironolactone (ALDACTONE) 25 MG tablet Take 25 mg by mouth daily.    Yes [provider]  vitamin C (ASCORBIC ACID) 500 MG tablet Take 500 mg by mouth daily.   Yes [provider]  warfarin (COUMADIN) 6 MG tablet Take 3-6 mg by mouth See admin instructions. Take 1 tablet (6mg ) by mouth every Monday, Wednesday, Friday evening and take  tablet (3mg ) by mouth every Tuesday, Thursday, Saturday and Sunday evening   Yes [provider]      VITAL SIGNS:  Blood pressure (!) 101/54, pulse (!) 59, temperature 97.7 F (36.5 C), temperature source Oral, resp. rate 20, height 4\' 11"  (1.499 m), weight 67.6 kg, SpO2 100 %.  PHYSICAL EXAMINATION:  Physical Exam Constitutional:      General: She is not in acute distress.    Appearance: Normal appearance. She is not ill-appearing.  HENT:     Right Ear: External ear normal.     Left Ear: External ear normal.     Nose: Nose normal.     Mouth/Throat:     Mouth: Mucous membranes are moist.     Pharynx: Oropharynx is clear.  Eyes:     General: No scleral icterus.    Conjunctiva/sclera: Conjunctivae normal.     Pupils: Pupils are equal, round, and reactive to light.  Neck:     Musculoskeletal: Normal range of motion and neck supple.  Cardiovascular:     Rate and Rhythm: Normal rate and regular rhythm.     Heart sounds: No murmur.  Pulmonary:     Effort: Pulmonary effort is normal.     Breath sounds: Normal breath sounds. No wheezing, rhonchi or rales.  Abdominal:     General: Bowel sounds  are normal. There is no distension.     Palpations: Abdomen is soft. There is no mass.     Tenderness: There is no abdominal tenderness.     Hernia: No hernia is present.  Musculoskeletal: Normal range of  motion.  Skin:    General: Skin is warm.     Capillary Refill: Capillary refill takes less than 2 seconds.     Findings: Bruising present.  Neurological:     General: No focal deficit present.     Mental Status: She is alert.  Psychiatric:        Mood and Affect: Mood normal.        Behavior: Behavior normal.     LABORATORY PANEL:   CBC Recent Labs  Lab 11/09/18 0148  WBC 5.3  HGB 8.9*  HCT 28.9*  PLT 117*   ------------------------------------------------------------------------------------------------------------------  Chemistries  Recent Labs  Lab 11/09/18 0148  NA 134*  K 3.8  CL 100  CO2 27  GLUCOSE 129*  BUN 101*  CREATININE 2.38*  CALCIUM 8.5*  AST 18  ALT 18  ALKPHOS 61  BILITOT 0.7   ------------------------------------------------------------------------------------------------------------------  Cardiac Enzymes Recent Labs  Lab 11/04/18 1141  TROPONINI 0.03*   ------------------------------------------------------------------------------------------------------------------  RADIOLOGY:  No results found.    IMPRESSION AND PLAN:   1.  Acute blood loss anemia - From large left lower extremity hematoma.  Pressure dressing applied with cessation of bleeding for now - Patient received 1 unit packed red blood cells - Kcentra and vitamin K on standby - INR is 3.5 - We will continue hemoglobin and hematocrit every 6 hours and repeat transfusion if necessary  2.  Left lower extremity hematoma with bleeding - Likely secondary to anticoagulation therapy with Coumadin for atrial fibrillation, mitral valve replacement, and pacemaker. - Vascular surgery- Dr. Lorenso Courier consulted for evaluation and recommendations - Kcentra and vitamin K on standby -  Pressure dressing has been applied with cessation of bleeding for now  3.  History of mitral valve replacement on anticoagulant therapy - Cardiology consulted for recommendations regarding anticoagulant with current situation with large left leg hematoma - We will continue telemetry monitoring  4.  Combined CHF - Coreg and spironolactone continued - Telemetry monitoring - Cardiology consulted recommendations with patient's recent discharge for exacerbation  5.  History of atrial fibrillation - Amiodarone continued - Anticoagulant therapy is on hold with current bleeding from large hematoma - Cardiology consulted for recommendations    All the records are reviewed and case discussed with ED provider. The plan of care was discussed in details with the patient (and family). I answered all questions. The patient agreed to proceed with the above mentioned plan. Further management will depend upon hospital course.   CODE STATUS: Full code  TOTAL TIME TAKING CARE OF THIS PATIENT: 45 minutes.    [place] on 11/09/2018 at 4:08 AM  Pager - [phone]  After 6pm go to [email or web address] - Proofreader  [facility]  Office  [phone]  CC: Primary care physician; Rusty Aus, MD  Attending physician admission note:  I have seen and examined the patient with Ms. Gardiner Barefoot, CRNP.  The patient presents with left lower extremity bleeding hematoma after being discharged from here status post treatment for CHF exacerbation.  Her hemoglobin has dropped to 8.9 with hematocrit 28.9 and her platelets were 117.  She received 1 unit of packed red blood cells.  Her Coumadin was held off.  She was given vitamin K and Kcentra.  Upon physical examination:  Generally: Pleasant elderly Caucasian female in no acute distress Cardiovascular: Regular rate and rhythm with normal S1-S2 and no murmurs gallops or rubs. Respiratory: Clear to auscultation  bilaterally Abdomen: Soft, nontender, nondistended  with positive bowel sounds and no palpable megaly or masses. Extremities: Left leg hematoma with erythema and tenderness without clubbing or cyanosis in both legs.  Labs and radiographic studies: were all reviewed  Assessment/plan: Left lower extremity hematoma with bleeding and subsequent acute blood loss anemia.  Patient had adequate hemostasis with left leg wrapping.  She was placed upon prophylactic empiric IV Ancef due to high risk of cellulitis.  She was transfused 1 unit of packed red blood cells and was given vitamin K and Kcentra being on Coumadin which was held off.  For further details please refer to dictated admission H&P.  I have discussed the case with my nurse practitioner. I agree with the admission note and the rest of the plan of care as delineated by Ms. Gardiner Barefoot, CRNP.   Note: This dictation was prepared with Dragon dictation along with smaller phrase technology. Any transcriptional errors that result from this process are unintentional.

## 2018-11-09 NOTE — ED Notes (Signed)
ED TO INPATIENT HANDOFF REPORT  ED Nurse Name and Phone #: Ena Dawley [phone]  E Name/Age/Gender [name] 84 y.o. female Room/Bed: ED15A/ED15A  Code Status   Code Status: Full Code  Home/SNF/Other Home Patient oriented to: self, place, time and situation Is this baseline? Yes   Triage Complete: Triage complete  Chief Complaint bleeding   Triage Note Pt presents from home via acems with c/o hematoma on left calf, the size of football, that ruptured while at home. Pt family reports "profuse amounts of blood loss at home". pt on warfarin. Pt seen here recently and cardioverted. Pt chronically on 3L at home.   Allergies Allergies  Allergen Reactions  . Ace Inhibitors     Cough  . Doxycycline Nausea Only  . Hydrocodone Itching and Nausea Only  . Mercury     Other reaction(s): Unknown  . Silver Dermatitis    Severe itching  . Cephalexin Rash  . Clindamycin/Lincomycin Rash  . Nickel Rash  . Penicillins Rash    Has patient had a PCN reaction causing immediate rash, facial/tongue/throat swelling, SOB or lightheadedness with hypotension: YES Has patient had a PCN reaction causing severe rash involving mucus membranes or skin necrosis: NO Has patient had a PCN reaction that required hospitalization NO Has patient had a PCN reaction occurring within the last 10 years: NO If all of the above answers are "NO", then may proceed with Cephalosporin use.    Level of Care/Admitting Diagnosis ED Disposition    ED Disposition Condition [facility] Area: [place] [[id number]]  Level of Care: Med-Surg [16]  Covid Evaluation: N/A  Diagnosis: Leg hematoma, left, initial encounter [[id number]]  Admitting Physician: Christel Mormon [[id number]]  Attending Physician: Christel Mormon [[id number]]  Estimated length of stay: past midnight tomorrow  Certification:: I certify this patient will need inpatient services for at least 2 midnights  PT Class (Do Not Modify):  Inpatient [101]  PT Acc Code (Do Not Modify): Private [1]       B Medical/Surgery History Past Medical History:  Diagnosis Date  . Arthritis    "back, hands, knees" (04/10/2016)  . Chronic atrial fibrillation    a. On Coumadin  . Chronic back pain   . 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 EF 25% by echo in 08/2015 b. RHC in 08/2015 showed normal filling pressures  . Chronic kidney disease   . Compression fracture    "several; all in my back" (04/10/2016)  . GERD (gastroesophageal reflux disease)   . Hypertension   . Lymphedema   . Pacemaker 05/2017  . S/P MVR (mitral valve replacement)    a. MVR 1994 b. redo MVR in 08/2014 - on Coumadin  . Shortness of breath dyspnea    Past Surgical History:  Procedure Laterality Date  . BREAST LUMPECTOMY Right 2005?   benign lump excision  . CARDIAC CATHETERIZATION    . [place], 2016   "MVR; MVR"  . CARDIOVERSION N/A 11/06/2018   Procedure: CARDIOVERSION;  Surgeon: Minna Merritts, MD;  Location: [facility] ORS;  Service: Cardiovascular;  Laterality: N/A;  . CATARACT EXTRACTION W/PHACO Right 02/20/2015   Procedure: CATARACT EXTRACTION PHACO AND INTRAOCULAR LENS PLACEMENT ([place]);  Surgeon: Estill Cotta, MD;  Location: [facility] ORS;  Service: Ophthalmology;  Laterality: Right;  US01:31.2 AP 25.3% CDE40.13 Fluid lot # [id number]  . ELECTROPHYSIOLOGIC STUDY N/A 10/09/2015   Procedure: CARDIOVERSION;  Surgeon:  Wellington Hampshire, MD;  Location: [facility] OR[facility];  Service: Cardiovascular;  Laterality: N/A;  . IR GENERIC HISTORICAL  04/01/2016   IR RADIOLOGIST EVAL & MGMT 04/01/2016 [facility]  . IR GENERIC HISTORICAL  04/15/2016   IR VERTEBROPLASTY CERV/THOR BX INC UNI/BIL INC/INJECT/IMAGING 04/15/2016 Luanne Bras, MD [facility]  . IR GENERIC HISTORICAL  04/15/2016   IR VERTEBROPLASTY EA ADDL (T&LS) BX INC UNI/BIL INC INJECT/IMAGING 04/15/2016 Luanne Bras, MD  [facility]  . IR GENERIC HISTORICAL  04/15/2016   IR VERTEBROPLASTY EA ADDL (T&LS) BX INC UNI/BIL INC INJECT/IMAGING 04/15/2016 Luanne Bras, MD [facility]  . IR GENERIC HISTORICAL  05/20/2016   IR RADIOLOGIST EVAL & MGMT 05/20/2016 [facility]  . IR GENERIC HISTORICAL  06/13/2016   IR VERTEBROPLASTY EA ADDL (T&LS) BX INC UNI/BIL INC INJECT/IMAGING 06/13/2016 Luanne Bras, MD [facility]  . IR GENERIC HISTORICAL  06/13/2016   IR SACROPLASTY BILATERAL 06/13/2016 Luanne Bras, MD [facility]  . IR GENERIC HISTORICAL  06/13/2016   IR VERTEBROPLASTY LUMBAR BX INC UNI/BIL INC/INJECT/IMAGING 06/13/2016 Luanne Bras, MD [facility]  . IRRIGATION AND DEBRIDEMENT HEMATOMA Left 07/05/2015   Procedure: IRRIGATION AND DEBRIDEMENT HEMATOMA;  Surgeon: Robert Bellow, MD;  Location: [facility] OR[facility];  Service: General;  Laterality: Left;  . pyloric stenosis  07/1937  . US ECHOCARDIOGRAPHY       A IV Location/Drains/Wounds Patient Lines/Drains/Airways Status   Active Line/Drains/Airways    Name:   Placement date:   Placement time:   Site:   Days:   Peripheral IV 11/09/18 Left Antecubital   11/09/18    0200    Antecubital   less than 1   Peripheral IV 11/09/18 Right Forearm   11/09/18    0230    Forearm   less than 1   Incision (Closed) 02/20/15 Eye Right   02/20/15    0756     1358   Incision (Closed) 07/05/15 Leg Left   07/05/15    1131     1223   Incision (Closed) 04/15/16 Back Upper   04/15/16    1537     938   Incision (Closed) 04/15/16 Back Right;Lower   04/15/16    1538     938   Pressure Injury 04/12/16 Stage II -  Partial thickness loss of dermis presenting as a shallow open ulcer with a red, pink wound bed without slough.    04/12/16    0800     941   Wound / Incision (Open or Dehisced) 09/28/15 Other (Comment) Leg hematoma that was surgically removed, that turned into a wound that's weeping   09/28/15    -    Leg   1138          Intake/Output Last 24  hours  Intake/Output Summary (Last 24 hours) at 11/09/2018 0506 Last data filed at 11/09/2018 0225 Gross per 24 hour  Intake 350 ml  Output -  Net 350 ml    Labs/Imaging Results for orders placed or performed during the hospital encounter of 11/09/18 (from the past 48 hour(s))  ABO/Rh     Status: None   Collection Time: 11/08/18  5:00 AM  Result Value Ref Range   ABO/RH(D)      A POS Performed at [facility], [address] [place], [place] [address]   Type and screen [place]     Status: None (Preliminary result)   Collection Time: 11/09/18  1:45 AM  Result Value Ref Range  ABO/RH(D) A POS    Antibody Screen NEG    Sample Expiration 11/12/2018,2359    Unit Number [id number]    Blood Component Type RED CELLS,LR    Unit division 00    Status of Unit ISSUED    Unit tag comment EMERGENCY RELEASE    Transfusion Status OK TO TRANSFUSE    Crossmatch Result COMPATIBLE    Unit Number [id number]    Blood Component Type RED CELLS,LR    Unit division 00    Status of Unit ISSUED    Unit tag comment EMERGENCY RELEASE    Transfusion Status OK TO TRANSFUSE    Crossmatch Result COMPATIBLE   CBC with Differential     Status: Abnormal   Collection Time: 11/09/18  1:48 AM  Result Value Ref Range   WBC 5.3 4.0 - 10.5 K/uL   RBC 3.18 (L) 3.87 - 5.11 MIL/uL   Hemoglobin 8.9 (L) 12.0 - 15.0 g/dL   HCT 28.9 (L) 36.0 - 46.0 %   MCV 90.9 80.0 - 100.0 fL   MCH 28.0 26.0 - 34.0 pg   MCHC 30.8 30.0 - 36.0 g/dL   RDW 17.5 (H) 11.5 - 15.5 %   Platelets 117 (L) 150 - 400 K/uL    Comment: Immature Platelet Fraction may be clinically indicated, consider ordering this additional test [id number]    nRBC 0.0 0.0 - 0.2 %   Neutrophils Relative % 71 %   Neutro Abs 3.7 1.7 - 7.7 K/uL   Lymphocytes Relative 8 %   Lymphs Abs 0.4 (L) 0.7 - 4.0 K/uL   Monocytes Relative 16 %   Monocytes Absolute 0.8 0.1 - 1.0 K/uL   Eosinophils Relative 4 %   Eosinophils  Absolute 0.2 0.0 - 0.5 K/uL   Basophils Relative 1 %   Basophils Absolute 0.1 0.0 - 0.1 K/uL   Immature Granulocytes 0 %   Abs Immature Granulocytes 0.01 0.00 - 0.07 K/uL    Comment: Performed at [facility], [address] [place], [place] [address]  Comprehensive metabolic panel     Status: Abnormal   Collection Time: 11/09/18  1:48 AM  Result Value Ref Range   Sodium 134 (L) 135 - 145 mmol/L   Potassium 3.8 3.5 - 5.1 mmol/L   Chloride 100 98 - 111 mmol/L   CO2 27 22 - 32 mmol/L   Glucose, Bld 129 (H) 70 - 99 mg/dL   BUN 101 (H) 8 - 23 mg/dL    Comment: RESULT CONFIRMED BY MANUAL DILUTION/RWW   Creatinine, Ser 2.38 (H) 0.44 - 1.00 mg/dL   Calcium 8.5 (L) 8.9 - 10.3 mg/dL   Total Protein 6.0 (L) 6.5 - 8.1 g/dL   Albumin 3.6 3.5 - 5.0 g/dL   AST 18 15 - 41 U/L   ALT 18 0 - 44 U/L   Alkaline Phosphatase 61 38 - 126 U/L   Total Bilirubin 0.7 0.3 - 1.2 mg/dL   GFR calc non Af Amer 19 (L) >60 mL/min   GFR calc Af Amer 21 (L) >60 mL/min   Anion gap 7 5 - 15    Comment: Performed at [facility], [address] [place], [place] [address]  APTT     Status: Abnormal   Collection Time: 11/09/18  1:48 AM  Result Value Ref Range   aPTT 64 (H) 24 - 36 seconds    Comment:        IF BASELINE aPTT IS ELEVATED, SUGGEST PATIENT RISK ASSESSMENT  BE USED TO DETERMINE APPROPRIATE ANTICOAGULANT THERAPY. Performed at [facility], [address] [place], [place] [address]   Protime-INR     Status: Abnormal   Collection Time: 11/09/18  1:48 AM  Result Value Ref Range   Prothrombin Time 34.7 (H) 11.4 - 15.2 seconds   INR 3.5 (H) 0.8 - 1.2    Comment: (NOTE) INR goal varies based on device and disease states. Performed at [facility], [address] [place], [place] [address]   Prepare RBC (crossmatch)     Status: None   Collection Time: 11/09/18  2:30 AM  Result Value Ref Range   Order Confirmation      ORDER PROCESSED BY BLOOD BANK Performed  at [facility], [address] [place], [place] [address]    No results found.  Pending Labs Unresulted Labs (From admission, onward)    Start     Ordered   11/11/18 0500  Protime-INR  Daily,   STAT     11/09/18 0243   11/10/18 0930  Hemoglobin and hematocrit, blood  Now then every 6 hours,   STAT     11/09/18 0334   11/10/18 0500  CBC  Daily,   STAT     11/09/18 0243   11/10/18 0300  Protime-INR  Once-Timed,   STAT     11/09/18 0243   11/09/18 2100  Protime-INR  Once-Timed,   STAT     11/09/18 0243   11/09/18 1500  Protime-INR  Once-Timed,   STAT     11/09/18 0243   11/09/18 0900  Protime-INR  Once-Timed,   STAT     11/09/18 0243   11/09/18 7322  Basic metabolic panel  Tomorrow morning,   STAT     11/09/18 0332   11/09/18 0500  CBC  Tomorrow morning,   STAT     11/09/18 0332   11/09/18 0500  Protime-INR  Tomorrow morning,   STAT     11/09/18 0332   11/09/18 0500  APTT  Tomorrow morning,   STAT     11/09/18 0332   11/09/18 0430  APTT  Once-Timed,   STAT     11/09/18 0243   11/09/18 0430  Protime-INR  Once-Timed,   STAT     11/09/18 0243          Vitals/Pain Today's Vitals   11/09/18 0400 11/09/18 0415 11/09/18 0430 11/09/18 0445  BP: 93/61 (!) 91/55 (!) 93/52 97/71  Pulse: 62 60 (!) 59 (!) 59  Resp: (!) 21 20 19  (!) 21  Temp:      TempSrc:      SpO2: 100% 100% 100% 100%  Weight:      Height:      PainSc:        Isolation Precautions No active isolations  Medications Medications  0.9 %  sodium chloride infusion (has no administration in time range)  phytonadione (VITAMIN K) 10 mg in dextrose 5 % 50 mL IVPB (has no administration in time range)  prothrombin complex conc human (KCENTRA) IVPB 1,500 Units (has no administration in time range)  amiodarone (PACERONE) tablet 200 mg (has no administration in time range)  carvedilol (COREG) tablet 3.125 mg (has no administration in time range)  acetaminophen (TYLENOL) CR tablet 650 mg (has no  administration in time range)  Vitamin D 2,000 Units (has no administration in time range)  potassium chloride (K-DUR) CR tablet 10 mEq (has no administration in time range)  spironolactone (ALDACTONE) tablet  25 mg (has no administration in time range)  vitamin C (ASCORBIC ACID) tablet 500 mg (has no administration in time range)  sodium chloride flush (NS) 0.9 % injection 3 mL (has no administration in time range)  sodium chloride flush (NS) 0.9 % injection 3 mL (has no administration in time range)  0.9 %  sodium chloride infusion (has no administration in time range)  acetaminophen (TYLENOL) tablet 650 mg (has no administration in time range)    Or  acetaminophen (TYLENOL) suppository 650 mg (has no administration in time range)  traZODone (DESYREL) tablet 25 mg (has no administration in time range)  polyethylene glycol (MIRALAX / GLYCOLAX) packet 17 g (has no administration in time range)  ondansetron (ZOFRAN) tablet 4 mg (has no administration in time range)    Or  ondansetron (ZOFRAN) injection 4 mg (has no administration in time range)  multivitamin with minerals tablet 1 tablet (has no administration in time range)    Mobility walks Low fall risk   Focused Assessments Cardiac Assessment Handoff:  Cardiac Rhythm: A-V Sequential paced Lab Results  Component Value Date   CKTOTAL 73 03/22/2012   CKMB 0.9 03/22/2012   TROPONINI 0.03 ([place]) 11/04/2018   No results found for: DDIMER Does the Patient currently have chest pain? No     R Recommendations: See Admitting Provider Note  Report given to:   Additional Notes: son and daughter updated att

## 2018-11-09 NOTE — Progress Notes (Signed)
Patient has had no urine out put this shift, she was bladder scanned, with approx. 767 ml of urine retained. I/O cath performed per order, with 875 ml of dark yellow clear urine.

## 2018-11-09 NOTE — Progress Notes (Addendum)
MEDICATION RELATED CONSULT NOTE - INITIAL   Pharmacy Consult for [facility] ([id number]) Indication: left calf hematoma s/t warfarin therapy  Allergies  Allergen Reactions  . Ace Inhibitors     Cough  . Doxycycline Nausea Only  . Hydrocodone Itching and Nausea Only  . Mercury     Other reaction(s): Unknown  . Silver Dermatitis    Severe itching  . Cephalexin Rash  . Clindamycin/Lincomycin Rash  . Nickel Rash  . Penicillins Rash    Has patient had a PCN reaction causing immediate rash, facial/tongue/throat swelling, SOB or lightheadedness with hypotension: YES Has patient had a PCN reaction causing severe rash involving mucus membranes or skin necrosis: NO Has patient had a PCN reaction that required hospitalization NO Has patient had a PCN reaction occurring within the last 10 years: NO If all of the above answers are "NO", then may proceed with Cephalosporin use.    Patient Measurements: Height: 4\' 11"  (149.9 cm) Weight: 149 lb (67.6 kg) IBW/kg (Calculated) : 43.2 Adjusted Body Weight: 54 kg  Vital Signs: Temp: 97.7 F (36.5 C) (05/11 0131) Temp Source: Oral (05/11 0131) BP: 92/47 (05/11 0131) Pulse Rate: 61 (05/11 0131) Intake/Output from previous day: No intake/output data recorded. Intake/Output from this shift: No intake/output data recorded.  Labs: Recent Labs    11/06/18 0446 11/07/18 0540 11/08/18 0500 11/09/18 0148  WBC 4.4  --   --  5.3  HGB 11.3*  --   --  8.9*  HCT 36.1  --   --  28.9*  PLT 112*  --   --  117*  CREATININE 2.36* 2.35* 2.41* 2.38*  ALBUMIN  --   --   --  3.6  PROT  --   --   --  6.0*  AST  --   --   --  18  ALT  --   --   --  18  ALKPHOS  --   --   --  61  BILITOT  --   --   --  0.7   Estimated Creatinine Clearance: 15.5 mL/min (A) (by C-G formula based on SCr of 2.38 mg/dL (H)).   Microbiology: Recent Results (from the past 720 hour(s))  SARS Coronavirus 2 (CEPHEID - Performed in [place] hospital lab), Hosp Order     Status:  None   Collection Time: 11/04/18  2:53 PM  Result Value Ref Range Status   SARS Coronavirus 2 NEGATIVE NEGATIVE Final    Comment: (NOTE) If result is NEGATIVE SARS-CoV-2 target nucleic acids are NOT DETECTED. The SARS-CoV-2 RNA is generally detectable in upper and lower  respiratory specimens during the acute phase of infection. The lowest  concentration of SARS-CoV-2 viral copies this assay can detect is 250  copies / mL. A negative result does not preclude SARS-CoV-2 infection  and should not be used as the sole basis for treatment or other  patient management decisions.  A negative result may occur with  improper specimen collection / handling, submission of specimen other  than nasopharyngeal swab, presence of viral mutation(s) within the  areas targeted by this assay, and inadequate number of viral copies  (<250 copies / mL). A negative result must be combined with clinical  observations, patient history, and epidemiological information. If result is POSITIVE SARS-CoV-2 target nucleic acids are DETECTED. The SARS-CoV-2 RNA is generally detectable in upper and lower  respiratory specimens dur ing the acute phase of infection.  Positive  results are indicative of active infection with  SARS-CoV-2.  Clinical  correlation with patient history and other diagnostic information is  necessary to determine patient infection status.  Positive results do  not rule out bacterial infection or co-infection with other viruses. If result is PRESUMPTIVE POSTIVE SARS-CoV-2 nucleic acids MAY BE PRESENT.   A presumptive positive result was obtained on the submitted specimen  and confirmed on repeat testing.  While 2019 novel coronavirus  (SARS-CoV-2) nucleic acids may be present in the submitted sample  additional confirmatory testing may be necessary for epidemiological  and / or clinical management purposes  to differentiate between  SARS-CoV-2 and other Sarbecovirus currently known to infect  humans.  If clinically indicated additional testing with an alternate test  methodology [phone]) is advised. The SARS-CoV-2 RNA is generally  detectable in upper and lower respiratory sp ecimens during the acute  phase of infection. The expected result is Negative. Fact Sheet for Patients:  StrictlyIdeas.no Fact Sheet for Healthcare Providers: BankingDealers.co.za This test is not yet approved or cleared by the [place] FDA and has been authorized for detection and/or diagnosis of SARS-CoV-2 by FDA under an Emergency Use Authorization (EUA).  This EUA will remain in effect (meaning this test can be used) for the duration of the COVID-19 declaration under Section 564(b)(1) of the Act, 21 U.S.C. section 360bbb-3(b)(1), unless the authorization is terminated or revoked sooner. Performed at [facility], [address], [place], [place] [address]     Medical History: Past Medical History:  Diagnosis Date  . Arthritis    "back, hands, knees" (04/10/2016)  . Chronic atrial fibrillation    a. On Coumadin  . Chronic back pain   . 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 EF 25% by echo in 08/2015 b. RHC in 08/2015 showed normal filling pressures  . Chronic kidney disease   . Compression fracture    "several; all in my back" (04/10/2016)  . GERD (gastroesophageal reflux disease)   . Hypertension   . Lymphedema   . Pacemaker 05/2017  . S/P MVR (mitral valve replacement)    a. MVR 1994 b. redo MVR in 08/2014 - on Coumadin  . Shortness of breath dyspnea     Medications:  Scheduled:    Assessment: Patient from home d/t hematoma on her left calf that burst w/ a profuse amount of blood. Patient has a h/o mechanical mitral valve replacement anticoagulated w/ warfarin PTA. Patient also received IV vitamin K 10 mg x 1.   Goal of Therapy:  Reversal of bleed  Plan:   Kcentra 1500 units IV x 1 ordered for patient Will monitor INRs 60 mins post, then q6h x 4, and daily x 2 Will monitor CBC's to ensure no further reduction in hgb/hct Once patient is stable and bleeding is no longer an issue may need to consider re-anticoagulating patient considering she has a mechanical mitral valve.  05/11 @ 0500 Kcentra was placed at bedside in order to 'have ready' per ED RN neither Kcentra nor vitamin K were given. Both have been placed in pharmacy return bin.  Tobie Lords, PharmD, BCPS Clinical Pharmacist 11/09/2018

## 2018-11-09 NOTE — TOC Initial Note (Signed)
Transition of Care [facility] - Initial/Assessment Note    Patient Details  Name: [name] MRN: [id number] Date of Birth: 1935-04-25  Transition of Care [facility] CM/SW Contact:    Elza Rafter, RN Phone Number: 11/09/2018, 2:29 PM  Clinical Narrative:     Patient is from home with daughter who is at bedside currently.  Was discharged on Sunday and readmitted in less than 12 hours for a hematoma of her left leg.  Referral was placed with [facility] for [facility] health services.  Patient would prefer [place] for PT with [facility].  Corene Cornea was made aware.  Uses walker; has home o2 at 3L; obtains medications at [facility] without difficulty.  Patient has private caregiver 4 hours per day.  Will continue to follw as patient progresses.                Expected Discharge Plan: [place] Barriers to Discharge: Continued Medical Work up   Patient Goals and CMS Choice Patient states their goals for this hospitalization and ongoing recovery are:: go home with home health CMS Medicare.gov Compare Post Acute Care list provided to:: Patient Choice offered to / list presented to : Patient  Expected Discharge Plan and Services Expected Discharge Plan: [place]   Discharge Planning Services: CM Consult Post Acute Care Choice: [place] arrangements for the past 2 months: Single Family Home                           HH Arranged: RN, PT [facility] Agency: [place] ([facility]) Date [place]: 11/09/18 Time [place]: [place] Representative spoke with at [place]: Corene Cornea  Prior Living Arrangements/Services Living arrangements for the past 2 months: [place] with:: Adult Children Patient language and need for interpreter reviewed:: Yes Do you feel safe going back to the place where you live?: Yes        Care giver support system in place?: Yes (comment)   Criminal Activity/Legal  Involvement Pertinent to Current Situation/Hospitalization: No - Comment as needed  Activities of Daily Living Home Assistive Devices/Equipment: Walker (specify type), Oxygen, Eyeglasses ADL Screening (condition at time of admission) Patient's cognitive ability adequate to safely complete daily activities?: Yes Is the patient deaf or have difficulty hearing?: No Does the patient have difficulty seeing, even when wearing glasses/contacts?: Yes Does the patient have difficulty concentrating, remembering, or making decisions?: No Patient able to express need for assistance with ADLs?: Yes Does the patient have difficulty dressing or bathing?: Yes Independently performs ADLs?: Yes (appropriate for developmental age) Does the patient have difficulty walking or climbing stairs?: Yes Weakness of Legs: Both Weakness of Arms/Hands: None  Permission Sought/Granted Permission sought to share information with : Facility Art therapist granted to share information with : Yes, Verbal Permission Granted  Share Information with NAME: Corene Cornea with [facility]           Emotional Assessment Appearance:: Appears stated age Attitude/Demeanor/Rapport: Gracious Affect (typically observed): Accepting Orientation: : Oriented to Self, Oriented to Place, Oriented to  Time, Oriented to Situation Alcohol / Substance Use: Not Applicable    Admission diagnosis:  Hematoma [T14.8XXA] Hypotension, unspecified hypotension type [I95.9] Anemia, unspecified type [D64.9] Patient Active Problem List   Diagnosis Date Noted  . Leg hematoma, left, initial encounter 11/09/2018  . Acute exacerbation of CHF (congestive heart failure) ([place]) 11/04/2018  . Pulmonary hypertension, unspecified (  [place]) 11/04/2018  . S/P MVR (mitral valve replacement) 11/04/2018  . Encounter for therapeutic drug monitoring 09/09/2017  . Encounter for anticoagulation discussion and counseling 09/09/2017  . Paroxysmal atrial  fibrillation ([place]) 02/25/2017  . Chronic venous insufficiency 10/27/2016  . Shortness of breath 08/14/2016  . Pressure injury of skin 04/12/2016  . Malnutrition of moderate degree 04/11/2016  . Vertebral compression fracture ([place]) 04/10/2016  . Intractable pain 04/10/2016  . Spinal compression fracture ([place]) 04/10/2016  . Chronic systolic heart failure ([place]) 11/19/2015  . Lymphedema   . CKD (chronic kidney disease), stage III ([place])   . Bilateral lower extremity edema 08/30/2015  . Acute on chronic renal failure ([place]) 04/28/2015  . Hyponatremia 04/28/2015  . H/O mitral valve replacement with mechanical valve 11/04/2014  . Long term current use of anticoagulant therapy 09/28/2014  . 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 Generalized OA 11/28/2013  . HLD (hyperlipidemia) 11/28/2013   PCP:  Rusty Aus, MD Pharmacy:   [facility], [place] - [address] [place] [address] Phone: [phone] Fax: [phone]     Social Determinants of Health (SDOH) Interventions    Readmission Risk Interventions Readmission Risk Prevention Plan 11/09/2018  Transportation Screening Complete  PCP or Specialist Appt within 3-5 Days Complete  HRI or [place] Complete  Social Work Consult for [place] Planning/Counseling Not Complete  SW consult not completed comments na  Palliative Care Screening Not Applicable  Medication Review Press photographer) Complete  Some recent data might be hidden

## 2018-11-09 NOTE — Consult Note (Signed)
[place] Nurse wound consult note Patient receiving care in [facility].  Per the patient's primary RN, Caryl Pina, Vascular Surgery wishes to have a three layer unna boot placed to the LLE and for it to remain in place for one week.  As luck would have it, while discussing the situation with the patient, her daughter, the primary RN Caryl Pina, and RN Estill Bamberg, we learned that Dr. Bary Castilla was on his way in to evaluate the LLE.  A bedside discussion ensured and the plan of care below is an agreed upon plan lead by Dr. Bary Castilla. Reason for Consult: Large burst hematoma to LLE Wound bed: blackened Drainage (amount, consistency, odor) some bright red on existing dry gauze dressing overlaid with coban Periwound: Split skin flap which remains in place.  It is a narrow horse shoe shape on the left lower lateral leg Dressing procedure/placement/frequency: Place Mepitel Kellie Simmering [phone], multiples of the largest size [employer] has in stock) over the split skin area.  Cover with ABD pads, kerlex, and Ace wrap.  Change daily. Monitor the wound area(s) for worsening of condition such as: Signs/symptoms of infection,  Increase in size,  Development of or worsening of odor, Development of pain, or increased pain at the affected locations.  Notify the medical team if any of these develop.  Thank you for the consult.  Discussed plan of care with the patient and bedside nurse.  [place] nurse will not follow at this time.  Please re-consult the [place] team if needed.  Val Riles, RN, MSN, CWOCN, CNS-BC, pager [phone]

## 2018-11-09 NOTE — ED Provider Notes (Signed)
[facility] Emergency Department Provider Note   ____________________________________________   I have reviewed the triage vital signs and the nursing notes.   HISTORY  Chief Complaint Leg Swelling   History limited by: Not Limited   HPI [name] is a 84 y.o. female who presents to the emergency department today because of concern for bleeding and swelling to the left leg. The patient states that it initially started as a small hematoma and then expanded to roughly the size of a football today. She then started having bleeding. The patient is on warfarin. Per EMS report last INR was 5. The patient did have a recent admission for CHF.   Records reviewed. Per medical record review patient has a history of GERD, CKD, recent admission for CHF complications, discharged yesterday. INR yesterday 3.3   Past Medical History:  Diagnosis Date  . Arthritis    "back, hands, knees" (04/10/2016)  . Chronic atrial fibrillation    a. On Coumadin  . Chronic back pain   . 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 EF 25% by echo in 08/2015 b. RHC in 08/2015 showed normal filling pressures  . Chronic kidney disease   . Compression fracture    "several; all in my back" (04/10/2016)  . GERD (gastroesophageal reflux disease)   . Hypertension   . Lymphedema   . Pacemaker 05/2017  . S/P MVR (mitral valve replacement)    a. MVR 1994 b. redo MVR in 08/2014 - on Coumadin  . Shortness of breath dyspnea     Patient Active Problem List   Diagnosis Date Noted  . Acute exacerbation of CHF (congestive heart failure) ([place]) 11/04/2018  . Pulmonary hypertension, unspecified ([place]) 11/04/2018  . S/P MVR (mitral valve replacement) 11/04/2018  . Encounter for therapeutic drug monitoring 09/09/2017  . Encounter for anticoagulation discussion and counseling 09/09/2017  . Paroxysmal atrial fibrillation ([place]) 02/25/2017  .  Chronic venous insufficiency 10/27/2016  . Shortness of breath 08/14/2016  . Pressure injury of skin 04/12/2016  . Malnutrition of moderate degree 04/11/2016  . Vertebral compression fracture ([place]) 04/10/2016  . Intractable pain 04/10/2016  . Spinal compression fracture ([place]) 04/10/2016  . Chronic systolic heart failure ([place]) 11/19/2015  . Lymphedema   . CKD (chronic kidney disease), stage III ([place])   . Bilateral lower extremity edema 08/30/2015  . Acute on chronic renal failure ([place]) 04/28/2015  . Hyponatremia 04/28/2015  . H/O mitral valve replacement with mechanical valve 11/04/2014  . Long term current use of anticoagulant therapy 09/28/2014  . 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 Generalized OA 11/28/2013  . HLD (hyperlipidemia) 11/28/2013    Past Surgical History:  Procedure Laterality Date  . BREAST LUMPECTOMY Right 2005?   benign lump excision  . CARDIAC CATHETERIZATION    . [place], 2016   "MVR; MVR"  . CARDIOVERSION N/A 11/06/2018   Procedure: CARDIOVERSION;  Surgeon: Minna Merritts, MD;  Location: [facility] ORS;  Service: Cardiovascular;  Laterality: N/A;  . CATARACT EXTRACTION W/PHACO Right 02/20/2015   Procedure: CATARACT EXTRACTION PHACO AND INTRAOCULAR LENS PLACEMENT ([place]);  Surgeon: Estill Cotta, MD;  Location: [facility] ORS;  Service: Ophthalmology;  Laterality: Right;  US01:31.2 AP 25.3% CDE40.13 Fluid lot # [id number]  . ELECTROPHYSIOLOGIC STUDY N/A 10/09/2015   Procedure: CARDIOVERSION;  Surgeon: Wellington Hampshire, MD;  Location: [facility] ORS;  Service: Cardiovascular;  Laterality: N/A;  . IR GENERIC HISTORICAL  04/01/2016  IR RADIOLOGIST EVAL & MGMT 04/01/2016 [facility]  . IR GENERIC HISTORICAL  04/15/2016   IR VERTEBROPLASTY CERV/THOR BX INC UNI/BIL INC/INJECT/IMAGING 04/15/2016 Luanne Bras, MD [facility]  . IR GENERIC HISTORICAL  04/15/2016   IR VERTEBROPLASTY EA ADDL (T&LS) BX INC UNI/BIL INC INJECT/IMAGING 04/15/2016  Luanne Bras, MD [facility]  . IR GENERIC HISTORICAL  04/15/2016   IR VERTEBROPLASTY EA ADDL (T&LS) BX INC UNI/BIL INC INJECT/IMAGING 04/15/2016 Luanne Bras, MD [facility]  . IR GENERIC HISTORICAL  05/20/2016   IR RADIOLOGIST EVAL & MGMT 05/20/2016 [facility]  . IR GENERIC HISTORICAL  06/13/2016   IR VERTEBROPLASTY EA ADDL (T&LS) BX INC UNI/BIL INC INJECT/IMAGING 06/13/2016 Luanne Bras, MD [facility]  . IR GENERIC HISTORICAL  06/13/2016   IR SACROPLASTY BILATERAL 06/13/2016 Luanne Bras, MD [facility]  . IR GENERIC HISTORICAL  06/13/2016   IR VERTEBROPLASTY LUMBAR BX INC UNI/BIL INC/INJECT/IMAGING 06/13/2016 Luanne Bras, MD [facility]  . IRRIGATION AND DEBRIDEMENT HEMATOMA Left 07/05/2015   Procedure: IRRIGATION AND DEBRIDEMENT HEMATOMA;  Surgeon: Robert Bellow, MD;  Location: [facility] ORS;  Service: General;  Laterality: Left;  . pyloric stenosis  07/1935  . US ECHOCARDIOGRAPHY      Prior to Admission medications   Medication Sig Start Date End Date Taking? Authorizing Provider  acetaminophen (TYLENOL) 650 MG CR tablet Take 650 mg by mouth every 8 (eight) hours as needed for pain.    [provider]  ALPRAZolam Duanne Moron) 0.25 MG tablet Take 0.25 mg by mouth at bedtime as needed for sleep. 10/27/18   [provider]  amiodarone (PACERONE) 200 MG tablet Take 1 tablet (200 mg total) by mouth daily. 09/24/18   Minna Merritts, MD  aspirin EC 81 MG tablet Take 81 mg by mouth daily.    [provider]  carvedilol (COREG) 3.125 MG tablet Take 1 tablet (3.125 mg total) by mouth 2 (two) times daily with a meal. 11/08/18   Salary, Montell D, MD  Cholecalciferol (VITAMIN D) 2000 units tablet Take 2,000 Units by mouth daily.    [provider]  KLOR-CON 10 10 MEQ tablet Take 10 mEq by mouth 2 (two) times daily.     [provider]  LASIX 20 MG tablet TAKE ONE TABLET BY MOUTH 3 TIMES DAILY Patient taking differently:  Take 20 mg by mouth 3 (three) times daily.  10/14/18   Minna Merritts, MD  losartan (COZAAR) 25 MG tablet TAKE 1/2 TABLET EVERY DAY Patient taking differently: Take 12.5 mg by mouth daily.  09/11/18   Minna Merritts, MD  Menthol (ICY HOT) 5 % PTCH Apply 1 patch topically daily as needed (pain).    [provider]  metolazone (ZAROXOLYN) 5 MG tablet Take 5 mg by mouth daily as needed (severe fluid retention).    [provider]  Probiotic Product (RISA-BID PROBIOTIC PO) Take 1 capsule by mouth daily.     [provider]  spironolactone (ALDACTONE) 25 MG tablet Take 25 mg by mouth daily.     [provider]  vitamin C (ASCORBIC ACID) 500 MG tablet Take 500 mg by mouth daily.    [provider]  warfarin (COUMADIN) 6 MG tablet Take 3-6 mg by mouth See admin instructions. Take 1 tablet (6mg ) by mouth every Monday, Wednesday, Friday evening and take  tablet (3mg ) by mouth every Tuesday, Thursday, Saturday and Sunday evening    [provider]    Allergies Ace inhibitors; Doxycycline; Hydrocodone; Mercury;  Silver; Cephalexin; Clindamycin/lincomycin; Nickel; and Penicillins  Family History  Problem Relation Age of Onset  . Stroke Mother   . Renal Disease Father     Social History Social History   Tobacco Use  . Smoking status: Former Smoker    Packs/day: 1.00    Years: 27.00    Pack years: 27.00    Types: Cigarettes    Last attempt to quit: 07/01/1980    Years since quitting: 38.3  . Smokeless tobacco: Never Used  . Tobacco comment: "quit smoking ~ 1980  Substance Use Topics  . Alcohol use: Not Currently    Alcohol/week: 0.0 standard drinks    Comment: 04/10/2016 "I'll have a drink on holidays/special occasions"  . Drug use: No    Review of Systems Constitutional: No fever/chills Eyes: No visual changes. ENT: No sore throat. Cardiovascular: Denies chest pain. Respiratory: Positive for shortness of  breath. Gastrointestinal: No abdominal pain.  No nausea, no vomiting.  No diarrhea.   Genitourinary: Negative for dysuria. Musculoskeletal: Positive for left leg swelling and bleeding.  Skin: Negative for rash. Neurological: Negative for headaches, focal weakness or numbness.  ____________________________________________   PHYSICAL EXAM:  VITAL SIGNS: ED Triage Vitals  Enc Vitals Group     BP 11/09/18 0131 (!) 92/47     Pulse Rate 11/09/18 0131 61     Resp --      Temp 11/09/18 0131 97.7 F (36.5 C)     Temp Source 11/09/18 0131 Oral     SpO2 11/09/18 0131 100 %     Weight 11/09/18 0133 149 lb (67.6 kg)     Height 11/09/18 0133 4\' 11"  (1.499 m)     Head Circumference --      Peak Flow --      Pain Score 11/09/18 0132 10   Constitutional: Alert and oriented.  Eyes: Conjunctivae are normal.  ENT      Head: Normocephalic and atraumatic.      Nose: No congestion/rhinnorhea.      Mouth/Throat: Mucous membranes are moist.      Neck: No stridor. Hematological/Lymphatic/Immunilogical: No cervical lymphadenopathy. Cardiovascular: Normal rate, regular rhythm.  No murmurs, rubs, or gallops.  Respiratory: Normal respiratory effort without tachypnea nor retractions. Breath sounds are clear and equal bilaterally. No wheezes/rales/rhonchi. Gastrointestinal: Soft and non tender. No rebound. No guarding.  Genitourinary: Deferred Musculoskeletal: Large hematoma with spontaneous bleeding to left lower leg.  Neurologic:  Normal speech and language. No gross focal neurologic deficits are appreciated.  Skin:  Skin is warm, dry and intact. No rash noted. Psychiatric: Mood and affect are normal. Speech and behavior are normal. Patient exhibits appropriate insight and judgment.  ____________________________________________    LABS (pertinent positives/negatives)  INR 3.5 CMP na 134, glu 129, cr 2.38 CBC wbc 5.3, hgb 8.9, plt  117  ____________________________________________   EKG  None  ____________________________________________    RADIOLOGY  None  ____________________________________________   PROCEDURES  Procedures  CRITICAL CARE Performed by: Nance Pear   Total critical care time: 35 minutes  Critical care time was exclusive of separately billable procedures and treating other patients.  Critical care was necessary to treat or prevent imminent or life-threatening deterioration.  Critical care was time spent personally by me on the following activities: development of treatment plan with patient and/or surrogate as well as nursing, discussions with consultants, evaluation of patient's response to treatment, examination of patient, obtaining history from patient or surrogate, ordering and performing treatments and interventions, ordering and review of  laboratory studies, ordering and review of radiographic studies, pulse oximetry and re-evaluation of patient's condition.  ____________________________________________   INITIAL IMPRESSION / ASSESSMENT AND PLAN / ED COURSE  Pertinent labs & imaging results that were available during my care of the patient were reviewed by me and considered in my medical decision making (see chart for details).   Patient presented to the emergency department today because of concern for bleeding and hematoma to left leg. On exam patient had a large hematoma with rupture and bleeding to her left leg. On exam it did open more and a large blood clot was evacuated. No pulsatile bleeding appreciated. The leg was then wrapped. Given patient's low blood pressure and bleeding she was given a unit of emergency release blood. Her blood pressure did seem to respond. Vitamin k and Eppie Gibson was ordered and was bedside. Patient is on anticoagulation for mitral valve replacement. Bleeding did slow significantly with wrapping. Will plan on admission.    ____________________________________________   FINAL CLINICAL IMPRESSION(S) / ED DIAGNOSES  Final diagnoses:  Anemia, unspecified type  Hypotension, unspecified hypotension type  Hematoma     Note: This dictation was prepared with Dragon dictation. Any transcriptional errors that result from this process are unintentional     Nance Pear, MD 11/09/18 [phone]

## 2018-11-09 NOTE — ED Triage Notes (Signed)
Pt presents from home via [facility] with c/o hematoma on left calf, the size of football, that ruptured while at home. Pt family reports "profuse amounts of blood loss at home". pt on warfarin. Pt seen here recently and cardioverted. Pt chronically on 3L at home.

## 2018-11-09 NOTE — ED Notes (Signed)
Pt and family's wishes for [name] and [name] only interventions to pt's leg or transfer to [facility] and request for sign to be posted saying to keep pt's legs above heart given to [name] and [place] [place] RNs

## 2018-11-09 NOTE — ED Notes (Signed)
Pt cleaned of blood by BETH and MELODY, EDT

## 2018-11-09 NOTE — Progress Notes (Signed)
Patient son, [name] called with update this afternoon per patient request he is to only be updated today. Daughter [name] in room at bedside still at this time.

## 2018-11-09 NOTE — Consult Note (Signed)
Reason for Consult:Hematoma left calf Referring Physician: Neha Waight is an 84 y.o. female. Discharged yesterday after cardioversion. "Bumped" leg during transfer home. Developed a large hematoma with spontaneous drainage prior to return to the ED.    HPI: Patient known to me from 2017 event where she had a 6-7 cm involving the medial aspect of the left distal calf. Debrided with slow healing.  No DVT documented, but decreased cardiac function and gradually progressive edema. Responded to pneumatic compressive garment.    Past Medical History:  Diagnosis Date  . Arthritis    "back, hands, knees" (04/10/2016)  . Chronic atrial fibrillation    a. On Coumadin  . Chronic back pain   . 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 EF 25% by echo in 08/2015 b. RHC in 08/2015 showed normal filling pressures  . Chronic kidney disease   . Compression fracture    "several; all in my back" (04/10/2016)  . GERD (gastroesophageal reflux disease)   . Hypertension   . Lymphedema   . Pacemaker 05/2017  . S/P MVR (mitral valve replacement)    a. MVR 1994 b. redo MVR in 08/2014 - on Coumadin  . Shortness of breath dyspnea     Past Surgical History:  Procedure Laterality Date  . BREAST LUMPECTOMY Right 2005?   benign lump excision  . CARDIAC CATHETERIZATION    . [place], 2016   "MVR; MVR"  . CARDIOVERSION N/A 11/06/2018   Procedure: CARDIOVERSION;  Surgeon: Minna Merritts, MD;  Location: [facility];  Service: Cardiovascular;  Laterality: N/A;  . CATARACT EXTRACTION W/PHACO Right 02/20/2015   Procedure: CATARACT EXTRACTION PHACO AND INTRAOCULAR LENS PLACEMENT ([place]);  Surgeon: Estill Cotta, MD;  Location: [facility];  Service: Ophthalmology;  Laterality: Right;  US01:31.2 AP 25.3% CDE40.13 Fluid lot # [id number]  . ELECTROPHYSIOLOGIC STUDY N/A 10/09/2015   Procedure: CARDIOVERSION;  Surgeon: Wellington Hampshire,  MD;  Location: [facility];  Service: Cardiovascular;  Laterality: N/A;  . IR GENERIC HISTORICAL  04/01/2016   IR RADIOLOGIST EVAL & MGMT 04/01/2016 [facility]  . IR GENERIC HISTORICAL  04/15/2016   IR VERTEBROPLASTY CERV/THOR BX INC UNI/BIL INC/INJECT/IMAGING 04/15/2016 Luanne Bras, MD [facility]  . IR GENERIC HISTORICAL  04/15/2016   IR VERTEBROPLASTY EA ADDL (T&LS) BX INC UNI/BIL INC INJECT/IMAGING 04/15/2016 Luanne Bras, MD [facility]  . IR GENERIC HISTORICAL  04/15/2016   IR VERTEBROPLASTY EA ADDL (T&LS) BX INC UNI/BIL INC INJECT/IMAGING 04/15/2016 Luanne Bras, MD [facility]  . IR GENERIC HISTORICAL  05/20/2016   IR RADIOLOGIST EVAL & MGMT 05/20/2016 [facility]  . IR GENERIC HISTORICAL  06/13/2016   IR VERTEBROPLASTY EA ADDL (T&LS) BX INC UNI/BIL INC INJECT/IMAGING 06/13/2016 Luanne Bras, MD [facility]  . IR GENERIC HISTORICAL  06/13/2016   IR SACROPLASTY BILATERAL 06/13/2016 Luanne Bras, MD [facility]  . IR GENERIC HISTORICAL  06/13/2016   IR VERTEBROPLASTY LUMBAR BX INC UNI/BIL INC/INJECT/IMAGING 06/13/2016 Luanne Bras, MD [facility]  . IRRIGATION AND DEBRIDEMENT HEMATOMA Left 07/05/2015   Procedure: IRRIGATION AND DEBRIDEMENT HEMATOMA;  Surgeon: Robert Bellow, MD;  Location: [facility];  Service: General;  Laterality: Left;  . pyloric stenosis  07/1937  . US ECHOCARDIOGRAPHY      Family History  Problem Relation Age of Onset  . Stroke Mother   . Renal Disease Father     Social History:  reports that she quit smoking about 36  years ago. Her smoking use included cigarettes. She has a 27.00 pack-year smoking history. She has never used smokeless tobacco. She reports previous alcohol use. She reports that she does not use drugs.  Allergies:  Allergies  Allergen Reactions  . Ace Inhibitors     Cough  . Doxycycline Nausea Only  . Hydrocodone Itching and Nausea Only  . Mercury     Other reaction(s): Unknown  . Silver  Dermatitis    Severe itching  . Cephalexin Rash  . Clindamycin/Lincomycin Rash  . Nickel Rash  . Penicillins Rash    Has patient had a PCN reaction causing immediate rash, facial/tongue/throat swelling, SOB or lightheadedness with hypotension: YES Has patient had a PCN reaction causing severe rash involving mucus membranes or skin necrosis: NO Has patient had a PCN reaction that required hospitalization NO Has patient had a PCN reaction occurring within the last 10 years: NO If all of the above answers are "NO", then may proceed with Cephalosporin use.    Medications: I have reviewed the patient's current medications.  Results for orders placed or performed during the hospital encounter of 11/09/18 (from the past 48 hour(s))  ABO/Rh     Status: None   Collection Time: 11/08/18  5:00 AM  Result Value Ref Range   ABO/RH(D)      A POS Performed at [facility], [address] [place], [place] [address]   Type and screen [place]     Status: None (Preliminary result)   Collection Time: 11/09/18  1:45 AM  Result Value Ref Range   ABO/RH(D) A POS    Antibody Screen NEG    Sample Expiration 11/12/2018,2359    Unit Number [id number]    Blood Component Type RED CELLS,LR    Unit division 00    Status of Unit ALLOCATED    Unit tag comment EMERGENCY RELEASE    Transfusion Status OK TO TRANSFUSE    Crossmatch Result COMPATIBLE    Unit Number [id number]    Blood Component Type RED CELLS,LR    Unit division 00    Status of Unit ISSUED    Unit tag comment EMERGENCY RELEASE    Transfusion Status OK TO TRANSFUSE    Crossmatch Result COMPATIBLE   CBC with Differential     Status: Abnormal   Collection Time: 11/09/18  1:48 AM  Result Value Ref Range   WBC 5.3 4.0 - 10.5 K/uL   RBC 3.18 (L) 3.87 - 5.11 MIL/uL   Hemoglobin 8.9 (L) 12.0 - 15.0 g/dL   HCT 28.9 (L) 36.0 - 46.0 %   MCV 90.9 80.0 - 100.0 fL   MCH 28.0 26.0 - 34.0 pg   MCHC 30.8 30.0 -  36.0 g/dL   RDW 17.5 (H) 11.5 - 15.5 %   Platelets 117 (L) 150 - 400 K/uL    Comment: Immature Platelet Fraction may be clinically indicated, consider ordering this additional test [id number]    nRBC 0.0 0.0 - 0.2 %   Neutrophils Relative % 71 %   Neutro Abs 3.7 1.7 - 7.7 K/uL   Lymphocytes Relative 8 %   Lymphs Abs 0.4 (L) 0.7 - 4.0 K/uL   Monocytes Relative 16 %   Monocytes Absolute 0.8 0.1 - 1.0 K/uL   Eosinophils Relative 4 %   Eosinophils Absolute 0.2 0.0 - 0.5 K/uL   Basophils Relative 1 %   Basophils Absolute 0.1 0.0 - 0.1 K/uL   Immature Granulocytes 0 %  Abs Immature Granulocytes 0.01 0.00 - 0.07 K/uL    Comment: Performed at [facility], [address] [place], [place] [address]  Comprehensive metabolic panel     Status: Abnormal   Collection Time: 11/09/18  1:48 AM  Result Value Ref Range   Sodium 134 (L) 135 - 145 mmol/L   Potassium 3.8 3.5 - 5.1 mmol/L   Chloride 100 98 - 111 mmol/L   CO2 27 22 - 32 mmol/L   Glucose, Bld 129 (H) 70 - 99 mg/dL   BUN 101 (H) 8 - 23 mg/dL    Comment: RESULT CONFIRMED BY MANUAL DILUTION/RWW   Creatinine, Ser 2.38 (H) 0.44 - 1.00 mg/dL   Calcium 8.5 (L) 8.9 - 10.3 mg/dL   Total Protein 6.0 (L) 6.5 - 8.1 g/dL   Albumin 3.6 3.5 - 5.0 g/dL   AST 18 15 - 41 U/L   ALT 18 0 - 44 U/L   Alkaline Phosphatase 61 38 - 126 U/L   Total Bilirubin 0.7 0.3 - 1.2 mg/dL   GFR calc non Af Amer 19 (L) >60 mL/min   GFR calc Af Amer 21 (L) >60 mL/min   Anion gap 7 5 - 15    Comment: Performed at [facility], [address] [place], [place] [address]  APTT     Status: Abnormal   Collection Time: 11/09/18  1:48 AM  Result Value Ref Range   aPTT 64 (H) 24 - 36 seconds    Comment:        IF BASELINE aPTT IS ELEVATED, SUGGEST PATIENT RISK ASSESSMENT BE USED TO DETERMINE APPROPRIATE ANTICOAGULANT THERAPY. Performed at [facility], [address] [place], [place] [address]   Protime-INR     Status: Abnormal    Collection Time: 11/09/18  1:48 AM  Result Value Ref Range   Prothrombin Time 34.7 (H) 11.4 - 15.2 seconds   INR 3.5 (H) 0.8 - 1.2    Comment: (NOTE) INR goal varies based on device and disease states. Performed at [facility], [address] [place], [place] [address]   Prepare RBC (crossmatch)     Status: None   Collection Time: 11/09/18  2:30 AM  Result Value Ref Range   Order Confirmation      ORDER PROCESSED BY BLOOD BANK Performed at [facility], [address] [place], [place] [address]   Basic metabolic panel     Status: Abnormal   Collection Time: 11/09/18  6:00 AM  Result Value Ref Range   Sodium 135 135 - 145 mmol/L   Potassium 3.9 3.5 - 5.1 mmol/L   Chloride 100 98 - 111 mmol/L   CO2 28 22 - 32 mmol/L   Glucose, Bld 116 (H) 70 - 99 mg/dL   BUN 100 (H) 8 - 23 mg/dL    Comment: RESULT CONFIRMED BY MANUAL DILUTION [facility]    Creatinine, Ser 2.19 (H) 0.44 - 1.00 mg/dL   Calcium 8.3 (L) 8.9 - 10.3 mg/dL   GFR calc non Af Amer 20 (L) >60 mL/min   GFR calc Af Amer 24 (L) >60 mL/min   Anion gap 7 5 - 15    Comment: Performed at [facility], [address] [place], [place] [address]  CBC     Status: Abnormal   Collection Time: 11/09/18  6:00 AM  Result Value Ref Range   WBC 6.1 4.0 - 10.5 K/uL   RBC 3.17 (L) 3.87 - 5.11 MIL/uL  Hemoglobin 9.0 (L) 12.0 - 15.0 g/dL   HCT 28.3 (L) 36.0 - 46.0 %   MCV 89.3 80.0 - 100.0 fL   MCH 28.4 26.0 - 34.0 pg   MCHC 31.8 30.0 - 36.0 g/dL   RDW 17.2 (H) 11.5 - 15.5 %   Platelets 107 (L) 150 - 400 K/uL    Comment: Immature Platelet Fraction may be clinically indicated, consider ordering this additional test [id number]    nRBC 0.0 0.0 - 0.2 %    Comment: Performed at [facility], [address] [place], [place] [address]  Protime-INR     Status: Abnormal   Collection Time: 11/09/18  6:00 AM  Result Value Ref Range   Prothrombin Time 32.3 (H) 11.4 - 15.2 seconds   INR 3.2 (H) 0.8 -  1.2    Comment: (NOTE) INR goal varies based on device and disease states. Performed at [facility], [address] [place], [place] [address]   APTT     Status: Abnormal   Collection Time: 11/09/18  6:00 AM  Result Value Ref Range   aPTT 53 (H) 24 - 36 seconds    Comment:        IF BASELINE aPTT IS ELEVATED, SUGGEST PATIENT RISK ASSESSMENT BE USED TO DETERMINE APPROPRIATE ANTICOAGULANT THERAPY. Performed at [facility], [address] [place], [place] [address]     No results found.  Review of Systems  Constitutional: Negative.   HENT: Negative.   Eyes: Negative.   Respiratory: Positive for shortness of breath.   Cardiovascular: Positive for leg swelling.  Gastrointestinal: Negative.   Genitourinary: Negative.        Difficulty with urination  Musculoskeletal: Negative.   Skin:       Hematoma with bleeding  Neurological: Negative.   Endo/Heme/Allergies: Bruises/bleeds easily.  Psychiatric/Behavioral: Negative.    Blood pressure (!) 93/46, pulse (!) 57, temperature 97.6 F (36.4 C), temperature source Oral, resp. rate (!) 21, height 5\' 11"  (1.803 m), weight 67.1 kg, SpO2 100 %. Physical Exam  Constitutional: She appears well-developed and well-nourished.  Musculoskeletal: Normal range of motion.  Neurological: She is alert.  Skin:     Psychiatric: She has a normal mood and affect. Her behavior is normal. Judgment and thought content normal.      The area of skin split is 15 cm in length.  The hematoma area near the top of the lower picture is 3 cm in diameter.  Fluctuant area thought to represent a venous varix is 3 cm in diameter.   Assessment/Plan: Hematoma with hydro-dissection of the overlying skin, nearly completely decompressed.   The ecchymotic area may require debridement, will place Mepitel over exposed soft tissue with Kerlex wrap and light Ace covering.  Will reassess tomorrow afternoon regarding need for debridement.    Forest Gleason Shloimy Michalski 11/09/2018, 3:58 PM

## 2018-11-09 NOTE — Consult Note (Signed)
[facility] Vascular Consult Note  MRN : [id number]  [name] is a 84 y.o. (July 05, 1934) female who presents with chief complaint of:   Chief Complaint  Patient presents with  . Leg Swelling   History of Present Illness:  The patient is an 84 year old female with a past medical history of mitral valve replacement on chronic coumadin, pacemaker, hypertension, GERD, chronic kidney disease, chronic atrial fibrillation, congestive heart failure, and lymphedema who was just recently discharged however presented the next day to the emergency department with a left lower extremity hematoma.  The patient is well-known to our service.  We follow her for lymphedema.  Patient struggles as a historian.  Some information for this consult was taken from prior EPIC notation.  Patient and patient's family report the patient experienced a hematoma the "size of a football" which ruptured while she was receiving a bath from an aide and "profuse amounts of blood" were noted.  Upon presentation to the emergency department, the patient's INR was 3.5.  The patient notes some discomfort to the area.  Patient denies any fever, nausea vomiting.  Patient denies any shortness of breath or chest pain.  She denies any recent trauma to the area.  Vascular Surgery was consulted by NP [facility] for further recommendations. Current Facility-Administered Medications  Medication Dose Route Frequency Provider Last Rate Last Dose  . 0.9 %  sodium chloride infusion   Intravenous Once Nance Pear, MD      . 0.9 %  sodium chloride infusion  250 mL Intravenous PRN Seals, Levada Dy H, NP      . acetaminophen (TYLENOL) tablet 650 mg  650 mg Oral Q6H PRN Gardiner Barefoot H, NP   650 mg at 11/09/18 8250   Or  . acetaminophen (TYLENOL) suppository 650 mg  650 mg Rectal Q6H PRN Seals, [name] Dills, NP      . amiodarone (PACERONE) tablet 200 mg  200 mg Oral Daily Seals, Levada Dy H, NP   200 mg at 11/09/18 0948  .  cholecalciferol (VITAMIN D) tablet 2,000 Units  2,000 Units Oral Daily Mayer Camel, NP   2,000 Units at 11/09/18 5397  . multivitamin with minerals tablet 1 tablet  1 tablet Oral Daily Seals, [name] Dills, NP   1 tablet at 11/09/18 0948  . ondansetron (ZOFRAN) tablet 4 mg  4 mg Oral Q6H PRN Seals, [name] Dills, NP       Or  . ondansetron (ZOFRAN) injection 4 mg  4 mg Intravenous Q6H PRN Seals, [name] Dills, NP      . phytonadione (VITAMIN K) 10 mg in dextrose 5 % 50 mL IVPB  10 mg Intravenous Once Nance Pear, MD   Stopped at 11/09/18 0220  . polyethylene glycol (MIRALAX / GLYCOLAX) packet 17 g  17 g Oral Daily PRN Seals, Levada Dy H, NP      . potassium chloride (K-DUR) CR tablet 10 mEq  10 mEq Oral BID Gardiner Barefoot H, NP   10 mEq at 11/09/18 0948  . prothrombin complex conc human (KCENTRA) IVPB 1,500 Units  1,500 Units Intravenous Once Nance Pear, MD   Stopped at 11/09/18 0515  . sodium chloride flush (NS) 0.9 % injection 3 mL  3 mL Intravenous Q12H Seals, Angela H, NP   3 mL at 11/09/18 0948  . sodium chloride flush (NS) 0.9 % injection 3 mL  3 mL Intravenous PRN Seals, Levada Dy H, NP      . traMADol (ULTRAM) tablet 50 mg  50 mg Oral Q6H PRN Nicholes Mango, MD   50 mg at 11/09/18 0948  . traZODone (DESYREL) tablet 25 mg  25 mg Oral QHS PRN Seals, [name] Dills, NP      . vitamin C (ASCORBIC ACID) tablet 500 mg  500 mg Oral Daily Seals, [name] Dills, NP   500 mg at 11/09/18 5397   Past Medical History:  Diagnosis Date  . Arthritis    "back, hands, knees" (04/10/2016)  . Chronic atrial fibrillation    a. On Coumadin  . Chronic back pain   . 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 EF 25% by echo in 08/2015 b. RHC in 08/2015 showed normal filling pressures  . Chronic kidney disease   . Compression fracture    "several; all in my back" (04/10/2016)  . GERD (gastroesophageal reflux disease)   . Hypertension   . Lymphedema   . Pacemaker 05/2017  .  S/P MVR (mitral valve replacement)    a. MVR 1994 b. redo MVR in 08/2014 - on Coumadin  . Shortness of breath dyspnea    Past Surgical History:  Procedure Laterality Date  . BREAST LUMPECTOMY Right 2005?   benign lump excision  . CARDIAC CATHETERIZATION    . [place], 2016   "MVR; MVR"  . CARDIOVERSION N/A 11/06/2018   Procedure: CARDIOVERSION;  Surgeon: Minna Merritts, MD;  Location: [facility] ORS;  Service: Cardiovascular;  Laterality: N/A;  . CATARACT EXTRACTION W/PHACO Right 02/20/2015   Procedure: CATARACT EXTRACTION PHACO AND INTRAOCULAR LENS PLACEMENT ([place]);  Surgeon: Estill Cotta, MD;  Location: [facility] ORS;  Service: Ophthalmology;  Laterality: Right;  US01:31.2 AP 25.3% CDE40.13 Fluid lot # [id number]  . ELECTROPHYSIOLOGIC STUDY N/A 10/09/2015   Procedure: CARDIOVERSION;  Surgeon: Wellington Hampshire, MD;  Location: [facility] ORS;  Service: Cardiovascular;  Laterality: N/A;  . IR GENERIC HISTORICAL  04/01/2016   IR RADIOLOGIST EVAL & MGMT 04/01/2016 [facility]  . IR GENERIC HISTORICAL  04/15/2016   IR VERTEBROPLASTY CERV/THOR BX INC UNI/BIL INC/INJECT/IMAGING 04/15/2016 Luanne Bras, MD [facility]  . IR GENERIC HISTORICAL  04/15/2016   IR VERTEBROPLASTY EA ADDL (T&LS) BX INC UNI/BIL INC INJECT/IMAGING 04/15/2016 Luanne Bras, MD [facility]  . IR GENERIC HISTORICAL  04/15/2016   IR VERTEBROPLASTY EA ADDL (T&LS) BX INC UNI/BIL INC INJECT/IMAGING 04/15/2016 Luanne Bras, MD [facility]  . IR GENERIC HISTORICAL  05/20/2016   IR RADIOLOGIST EVAL & MGMT 05/20/2016 [facility]  . IR GENERIC HISTORICAL  06/13/2016   IR VERTEBROPLASTY EA ADDL (T&LS) BX INC UNI/BIL INC INJECT/IMAGING 06/13/2016 Luanne Bras, MD [facility]  . IR GENERIC HISTORICAL  06/13/2016   IR SACROPLASTY BILATERAL 06/13/2016 Luanne Bras, MD [facility]  . IR GENERIC HISTORICAL  06/13/2016   IR VERTEBROPLASTY LUMBAR BX INC UNI/BIL INC/INJECT/IMAGING 06/13/2016  Luanne Bras, MD [facility]  . IRRIGATION AND DEBRIDEMENT HEMATOMA Left 07/05/2015   Procedure: IRRIGATION AND DEBRIDEMENT HEMATOMA;  Surgeon: Robert Bellow, MD;  Location: [facility] ORS;  Service: General;  Laterality: Left;  . pyloric stenosis  07/1937  . US ECHOCARDIOGRAPHY     Social History Social History   Tobacco Use  . Smoking status: Former Smoker    Packs/day: 1.00    Years: 27.00    Pack years: 27.00    Types: Cigarettes    Last attempt to quit: 07/01/1980    Years since quitting: 38.3  . Smokeless tobacco: Never Used  . Tobacco  comment: "quit smoking ~ 1980  Substance Use Topics  . Alcohol use: Not Currently    Alcohol/week: 0.0 standard drinks    Comment: 04/10/2016 "I'll have a drink on holidays/special occasions"  . Drug use: No   Family History Family History  Problem Relation Age of Onset  . Stroke Mother   . Renal Disease Father   Denies family history of peripheral artery disease, venous disease and/or bleeding/clotting disorders.  Allergies  Allergen Reactions  . Ace Inhibitors     Cough  . Doxycycline Nausea Only  . Hydrocodone Itching and Nausea Only  . Mercury     Other reaction(s): Unknown  . Silver Dermatitis    Severe itching  . Cephalexin Rash  . Clindamycin/Lincomycin Rash  . Nickel Rash  . Penicillins Rash    Has patient had a PCN reaction causing immediate rash, facial/tongue/throat swelling, SOB or lightheadedness with hypotension: YES Has patient had a PCN reaction causing severe rash involving mucus membranes or skin necrosis: NO Has patient had a PCN reaction that required hospitalization NO Has patient had a PCN reaction occurring within the last 10 years: NO If all of the above answers are "NO", then may proceed with Cephalosporin use.   REVIEW OF SYSTEMS (Negative unless checked)  Constitutional: [] Weight loss  [] Fever  [] Chills Cardiac: [] Chest pain   [] Chest pressure   [] Palpitations   [] Shortness of breath when laying  flat   [] Shortness of breath at rest   [] Shortness of breath with exertion. Vascular:  [] Pain in legs with walking   [] Pain in legs at rest   [] Pain in legs when laying flat   [] Claudication   [] Pain in feet when walking  [] Pain in feet at rest  [] Pain in feet when laying flat   [] History of DVT   [] Phlebitis   [x] Swelling in legs   [] Varicose veins   [] Non-healing ulcers Pulmonary:   [] Uses home oxygen   [] Productive cough   [] Hemoptysis   [] Wheeze  [] COPD   [] Asthma Neurologic:  [] Dizziness  [] Blackouts   [] Seizures   [] History of stroke   [] History of TIA  [] Aphasia   [] Temporary blindness   [] Dysphagia   [] Weakness or numbness in arms   [] Weakness or numbness in legs Musculoskeletal:  [] Arthritis   [] Joint swelling   [] Joint pain   [] Low back pain Hematologic:  [] Easy bruising  [x] Easy bleeding   [] Hypercoagulable state   [] Anemic  [] Hepatitis Gastrointestinal:  [] Blood in stool   [] Vomiting blood  [] Gastroesophageal reflux/heartburn   [] Difficulty swallowing. Genitourinary:  [x] Chronic kidney disease   [] Difficult urination  [] Frequent urination  [] Burning with urination   [] Blood in urine Skin:  [] Rashes   [] Ulcers   [x] Wounds Psychological:  [] History of anxiety   []  History of major depression.  Physical Examination  Vitals:   11/09/18 0445 11/09/18 0603 11/09/18 0608 11/09/18 0821  BP: 97/71  (!) 87/53 (!) 93/46  Pulse: (!) 59  60 (!) 57  Resp: (!) 21     Temp:   97.8 F (36.6 C) 97.6 F (36.4 C)  TempSrc:   Oral Oral  SpO2: 100%  97% 100%  Weight:  67.1 kg    Height:  5\' 11"  (1.803 m)     Body mass index is 20.64 kg/m. Gen:  WD/WN, NAD Head: [place]/AT, No temporalis wasting. Prominent temp pulse not noted. Ear/Nose/Throat: Hearing grossly intact, nares w/o erythema or drainage, oropharynx w/o Erythema/Exudate Eyes: Sclera non-icteric, conjunctiva clear Neck: Trachea midline.  No JVD.  Pulmonary:  Good air movement, respirations not labored, equal bilaterally.  Cardiac:  Irregularly irregular Vascular:  Vessel Right Left  Radial Palpable Palpable  Ulnar Palpable Palpable  Brachial Palpable Palpable  Carotid Palpable, without bruit Palpable, without bruit  Aorta Not palpable N/A  Femoral Palpable Palpable  Popliteal Palpable Palpable  PT Non-Palpable Non-Palpable  DP Non-Palpable Non-Palpable   Left Lower Extremity:  Thigh soft, calf soft. Extremity is warm distally. Hard to palpate pedal pulses due to edema. Motor / sensory is intact. Hematoma noted tracking upward to lateral thigh. No active bleeding noted at this time. Drainage is old and coagulated blood.     Gastrointestinal: soft, non-tender/non-distended. No guarding/reflex.  Musculoskeletal: M/S 5/5 throughout.   Neurologic: Sensation grossly intact in extremities.  Symmetrical.  Speech is fluent. Motor exam as listed above. Psychiatric: Judgment intact, Mood & affect appropriate for pt's clinical situation. Dermatologic: See above Lymph : No Cervical, Axillary, or Inguinal lymphadenopathy.  CBC Lab Results  Component Value Date   WBC 6.1 11/09/2018   HGB 9.0 (L) 11/09/2018   HCT 28.3 (L) 11/09/2018   MCV 89.3 11/09/2018   PLT 107 (L) 11/09/2018   BMET    Component Value Date/Time   NA 135 11/09/2018 0600   NA 139 09/29/2017 1214   NA 143 03/27/2012 0418   K 3.9 11/09/2018 0600   K 3.9 02/20/2015 0630   CL 100 11/09/2018 0600   CL 111 (H) 03/27/2012 0418   CO2 28 11/09/2018 0600   CO2 21 03/27/2012 0418   GLUCOSE 116 (H) 11/09/2018 0600   GLUCOSE 96 03/27/2012 0418   BUN 100 (H) 11/09/2018 0600   BUN 55 (H) 09/29/2017 1214   BUN 10 03/27/2012 0418   CREATININE 2.19 (H) 11/09/2018 0600   CREATININE 0.84 03/27/2012 0418   CALCIUM 8.3 (L) 11/09/2018 0600   CALCIUM 8.3 (L) 03/27/2012 0418   GFRNONAA 20 (L) 11/09/2018 0600   GFRNONAA >60 03/27/2012 0418   GFRAA 24 (L) 11/09/2018 0600   GFRAA >60 03/27/2012 0418   Estimated Creatinine Clearance: 21.3 mL/min (A) (by C-G  formula based on SCr of 2.19 mg/dL (H)).  COAG Lab Results  Component Value Date   INR 3.2 (H) 11/09/2018   INR 3.5 (H) 11/09/2018   INR 3.3 (H) 11/08/2018   Radiology Dg Chest Port 1 View  Result Date: 11/04/2018 CLINICAL DATA:  Shortness of breath and bilateral lower extremity edema developing over the past few days. EXAM: PORTABLE CHEST 1 VIEW COMPARISON:  PA and lateral chest 10/10/2015. FINDINGS: The patient is status post mitral valve repair. There is cardiomegaly and pulmonary edema. No pneumothorax. Likely small pleural effusions noted. No acute bony abnormality. IMPRESSION: Cardiomegaly and interstitial edema. There are likely small bilateral pleural effusions. Electronically Signed   By: Inge Rise M.D.   On: 11/04/2018 12:47   Assessment/Plan The patient is an 84 year old female with a past medical history of mitral valve replacement on chronic coumadin, pacemaker, hypertension, GERD, chronic kidney disease, chronic atrial fibrillation, congestive heart failure, and lymphedema who was just recently discharged however presented the next day to the emergency department with a left lower extremity hematoma. 1.  Left lower extremity hematoma: No acute vascular compromise noted to the left lower extremity at this time however the patient has a large hematoma which I do not feel is actively bleeding at this time.  Would continue to trend CBC - expect the ecchymosis to worsen over the next couple of days.  If the  patient CBC does not remain stable and there are signs of active bleeding would consider possible embolization.  The patient is at a very high risk for developing a wound to the lateral aspect of the left leg.  At this time, recommend three layer zinc oxide unna wraps to the left lower extremity. This will provide compression and zinc oxide to the skin.  Patient should elevate her legs. Will continue to follow.  2. Consult wound care for placement of a three layer zinc oxide unna  wraps to the left lower extremity changed once a week if not sooner due to drainage.  3. Lymphedema: Patient is well-known to [place] as we follow her for lymphedema as an outpatient.  At this time, there are no new recommendations in regard to her lymphedema.   Discussed with Dr. Mayme Genta, PA-C  11/09/2018 11:11 AM    This note was created with Dragon medical transcription system.  Any error is purely unintentional

## 2018-11-09 NOTE — Consult Note (Signed)
[place] Nurse wound consult note Patient receiving care in [facility].  Reason for Consult: "left lower leg hematoma" Wound type: hematoma and blood loss contributing to acute blood loss anemia, resulting in the transfusion of one unit of packed red cells. According to the note on 11/09/18 from Dr. Eugenie Norrie:  "2.  Left lower extremity hematoma with bleeding - Likely secondary to anticoagulation therapy with Coumadin for atrial fibrillation, mitral valve replacement, and pacemaker. - Vascular surgery- Dr. Lorenso Courier consulted for evaluation and recommendations - Kcentra and vitamin K on standby - Pressure dressing has been applied with cessation of bleeding for now"  The care of this delicate situation exceeds the scope of practice of the [facility] nurse.  Please maintain the pressure dressing and watch for evaluation and recommendations from Vascular Surgeon, Dr. Lorenso Courier.  Report all "bleed through" to the physician attending the patient.  Val Riles, RN, MSN, CWOCN, CNS-BC, pager [phone]

## 2018-11-09 NOTE — Progress Notes (Signed)
Patient admitted early this morning.  Seen and examined by me later in the morning.  See H&P for more information.  Patient states she is having a lot of left lower extremity pain this morning.  The site continues to ooze.  She is s/p 1 unit PRBC.  I had a long (>30 min) discussion with patient, [name] (daughter), Ashly (RN), Estill Bamberg (Pharmacist, community), and Maudie Mercury (vascular surgery) regarding the treatment plan.  Patient and daughter requesting evaluation by Dr. Bary Castilla, who they have seen in the past.  -Greatly appreciate consultation by Dr. Kerby Less will reassess tomorrow afternoon regarding need for debridement -Will check hemoglobin this afternoon and then again in the morning -Holding Coumadin -Recheck INR in the morning  Hyman Bible, MD

## 2018-11-09 NOTE — Progress Notes (Signed)
Patient reassessed for pain management and pain medication effectiveness, she does still complain of pain in the left lower leg with the hematoma however does not wish to try any stronger pain medication at this time. She would like to continue tylenol and tramadol. Vascular has been in to see patient and will not offer any intervention at this time however they did re-wrap left lower extremity with pressure bandage, we will monitor H&H and continue to assess for active bleeding. BLE elevated at this time.

## 2018-11-10 DIAGNOSIS — I471 Supraventricular tachycardia: Secondary | ICD-10-CM

## 2018-11-10 DIAGNOSIS — Z952 Presence of prosthetic heart valve: Secondary | ICD-10-CM

## 2018-11-10 DIAGNOSIS — I480 Paroxysmal atrial fibrillation: Secondary | ICD-10-CM

## 2018-11-10 DIAGNOSIS — I5042 Chronic combined systolic (congestive) and diastolic (congestive) heart failure: Secondary | ICD-10-CM

## 2018-11-10 LAB — CBC
HCT: 24.4 % — ABNORMAL LOW (ref 36.0–46.0)
Hemoglobin: 7.7 g/dL — ABNORMAL LOW (ref 12.0–15.0)
MCH: 28.4 pg (ref 26.0–34.0)
MCHC: 31.6 g/dL (ref 30.0–36.0)
MCV: 90 fL (ref 80.0–100.0)
Platelets: 114 10*3/uL — ABNORMAL LOW (ref 150–400)
RBC: 2.71 MIL/uL — ABNORMAL LOW (ref 3.87–5.11)
RDW: 17.5 % — ABNORMAL HIGH (ref 11.5–15.5)
WBC: 6.4 10*3/uL (ref 4.0–10.5)
nRBC: 0 % (ref 0.0–0.2)

## 2018-11-10 LAB — URINE CULTURE: Culture: NO GROWTH

## 2018-11-10 LAB — PROTIME-INR
INR: 2.8 — ABNORMAL HIGH (ref 0.8–1.2)
Prothrombin Time: 28.8 seconds — ABNORMAL HIGH (ref 11.4–15.2)

## 2018-11-10 LAB — HEMOGLOBIN AND HEMATOCRIT, BLOOD
HCT: 30.4 % — ABNORMAL LOW (ref 36.0–46.0)
Hemoglobin: 9.6 g/dL — ABNORMAL LOW (ref 12.0–15.0)

## 2018-11-10 LAB — PREPARE RBC (CROSSMATCH)

## 2018-11-10 MED ORDER — VITAMIN K1 10 MG/ML IJ SOLN
5.0000 mg | Freq: Once | INTRAVENOUS | Status: AC
  Administered 2018-11-10: 5 mg via INTRAVENOUS
  Filled 2018-11-10: qty 0.5

## 2018-11-10 MED ORDER — SODIUM CHLORIDE 0.9% IV SOLUTION
Freq: Once | INTRAVENOUS | Status: AC
  Administered 2018-11-10: 10:00:00 via INTRAVENOUS

## 2018-11-10 NOTE — Plan of Care (Signed)
  Problem: Clinical Measurements: Goal: Respiratory complications will improve Outcome: Progressing Note:  On chronic 2L O2   Problem: Nutrition: Goal: Adequate nutrition will be maintained Outcome: Progressing   Problem: Coping: Goal: Level of anxiety will decrease Outcome: Progressing   Problem: Elimination: Goal: Will not experience complications related to urinary retention Outcome: Progressing Note:  Bladder scanned pt twice, this morning showed 470, however pt said she felt if she could sit on the bedside commode she felt she could use the bathroom, this RN and two NT's assisted pt to [facility] while pt was NWB on left left.. pt tolerated well & was able to urinate.   Problem: Safety: Goal: Ability to remain free from injury will improve Outcome: Progressing   Problem: Skin Integrity: Goal: Risk for impaired skin integrity will decrease Outcome: Progressing   Problem: Pain Managment: Goal: General experience of comfort will improve Outcome: Not Progressing Note:  Still complaining of pain to left leg, treated once with tylenol & once with tramadol which gave relief

## 2018-11-10 NOTE — Progress Notes (Addendum)
Patient daughter in room at bedside, patient has lack of appetite unless it is food brought in by daughter. Daughter has brought in food from [employer] patient this RN encouraged patient to decrease her sodium intake, and educated patient and daughter about healthier choices when dining out.

## 2018-11-10 NOTE — Progress Notes (Signed)
Patient noted to have increased oozing of left lower leg hematoma this morning upon assessment, this RN will not remove wrap placed by Dr. Bary Castilla at this time, however will continue to elevate extremity and monitor bleeding. Patient receiving vitamin K at this time, with 1 unit RBC's to be administered. Patient is pleasant and does not complain of pain at this time. She does wish to get up to the bedside commode, this nurse has encouraged use of bedpan or purewick external catheter in order to decrease movement of extremity and chance of worsening bleed.

## 2018-11-10 NOTE — Progress Notes (Signed)
kerlex dressing changed, ace wrap replaced, mepitel still in tact after assessment by Dr Bary Castilla

## 2018-11-10 NOTE — Consult Note (Signed)
Cardiology Consultation:   Patient ID: RYLANN MUNFORD MRN: [id number]; DOB: [birth year]  Admit date: 11/09/2018 Date of Consult: 11/10/2018  Primary Care Provider: Rusty Aus, MD Primary Cardiologist: Ida Rogue, MD and Derinda Sis, MD [facility]) Primary Electrophysiologist:  None    Patient Profile:   [name] is a 84 y.o. female with a hx of persistent atrial fibrillation s/p DCCV (most recently 01/04/7208), chronic systolic heart failure s/p CRT, mechanical MVR, mild aortic stenosis, CKD, and COPD with chronic respiratory failure with hypoxia on home O2, who is being seen today for the evaluation of heart failure in the setting of spontaneous left calf hematoma at the request of Dr. Brett Albino.  History of Present Illness:   [name] was hospitalized at [facility] from 5/6-5/04/2019.  She presented with worsening renal function in the setting of escalating diuretic doses due to progressive shortness of breath.  LVEF was noted to have dropped from ~40% to 25-30%.  She was also noted to be in atrial tachycardia and underwent DCCV on 5/8.  INR was noted to be therapeutic to slightly supratherapeutic during hospitalization.  Shortly after returning home on the evening of 11/08/2018, [name] began to notice worsening left calf pain at the site of a small spontaneous hematoma.  The hematoma was initially only quarter size but grew to involve most of the calf over the next few hours.  She represented to the ED and was found to have a drop in hemoglobin from 11.3->8.9.  This has trended down further to 7.7 today, prompting transfusion of 2 units of PRBC's.  Hematoma was evaluated by vascular surgery, with recommendations for conservative care.  There was no active bleeding at that time or evidence of neurovascular compromise.  [name] notes that her shortness of breath is unchanged.  She has stable 1-2 pillow orthopnea as well as chronic leg edema that waxes and wanes.  She has been  compliant with her medications.  She endorses lightheadedness shortly after leaving the hospital on 5/10 but not since.  She also denies chest pain and palpitations.  Past Medical History:  Diagnosis Date  . Arthritis    "back, hands, knees" (04/10/2016)  . Chronic atrial fibrillation    a. On Coumadin  . Chronic back pain   . 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 EF 25% by echo in 08/2015 b. RHC in 08/2015 showed normal filling pressures  . Chronic kidney disease   . Compression fracture    "several; all in my back" (04/10/2016)  . GERD (gastroesophageal reflux disease)   . Hypertension   . Lymphedema   . Pacemaker 05/2017  . S/P MVR (mitral valve replacement)    a. MVR 1994 b. redo MVR in 08/2014 - on Coumadin  . Shortness of breath dyspnea     Past Surgical History:  Procedure Laterality Date  . BREAST LUMPECTOMY Right 2005?   benign lump excision  . CARDIAC CATHETERIZATION    . [place], 2016   "MVR; MVR"  . CARDIOVERSION N/A 11/06/2018   Procedure: CARDIOVERSION;  Surgeon: Minna Merritts, MD;  Location: [facility] ORS;  Service: Cardiovascular;  Laterality: N/A;  . CATARACT EXTRACTION W/PHACO Right 02/20/2015   Procedure: CATARACT EXTRACTION PHACO AND INTRAOCULAR LENS PLACEMENT ([place]);  Surgeon: Estill Cotta, MD;  Location: [facility] ORS;  Service: Ophthalmology;  Laterality: Right;  US01:31.2 AP 25.3% CDE40.13 Fluid lot # [id number]  . ELECTROPHYSIOLOGIC STUDY N/A 10/09/2015  Procedure: CARDIOVERSION;  Surgeon: Wellington Hampshire, MD;  Location: [facility] ORS;  Service: Cardiovascular;  Laterality: N/A;  . IR GENERIC HISTORICAL  04/01/2016   IR RADIOLOGIST EVAL & MGMT 04/01/2016 [facility]  . IR GENERIC HISTORICAL  04/15/2016   IR VERTEBROPLASTY CERV/THOR BX INC UNI/BIL INC/INJECT/IMAGING 04/15/2016 Luanne Bras, MD [facility]  . IR GENERIC HISTORICAL  04/15/2016   IR VERTEBROPLASTY EA ADDL (T&LS)  BX INC UNI/BIL INC INJECT/IMAGING 04/15/2016 Luanne Bras, MD [facility]  . IR GENERIC HISTORICAL  04/15/2016   IR VERTEBROPLASTY EA ADDL (T&LS) BX INC UNI/BIL INC INJECT/IMAGING 04/15/2016 Luanne Bras, MD [facility]  . IR GENERIC HISTORICAL  05/20/2016   IR RADIOLOGIST EVAL & MGMT 05/20/2016 [facility]  . IR GENERIC HISTORICAL  06/13/2016   IR VERTEBROPLASTY EA ADDL (T&LS) BX INC UNI/BIL INC INJECT/IMAGING 06/13/2016 Luanne Bras, MD [facility]  . IR GENERIC HISTORICAL  06/13/2016   IR SACROPLASTY BILATERAL 06/13/2016 Luanne Bras, MD [facility]  . IR GENERIC HISTORICAL  06/13/2016   IR VERTEBROPLASTY LUMBAR BX INC UNI/BIL INC/INJECT/IMAGING 06/13/2016 Luanne Bras, MD [facility]  . IRRIGATION AND DEBRIDEMENT HEMATOMA Left 07/05/2015   Procedure: IRRIGATION AND DEBRIDEMENT HEMATOMA;  Surgeon: Robert Bellow, MD;  Location: [facility] ORS;  Service: General;  Laterality: Left;  . pyloric stenosis  07/1937  . US ECHOCARDIOGRAPHY       Home Medications:  Prior to Admission medications   Medication Sig Start Date Aws Shere Date Taking? Authorizing Provider  acetaminophen (TYLENOL) 650 MG CR tablet Take 650 mg by mouth every 8 (eight) hours as needed for pain.   Yes [provider]  ALPRAZolam (XANAX) 0.25 MG tablet Take 0.25 mg by mouth at bedtime as needed for sleep. 10/27/18  Yes [provider]  amiodarone (PACERONE) 200 MG tablet Take 1 tablet (200 mg total) by mouth daily. 09/24/18  Yes Minna Merritts, MD  aspirin EC 81 MG tablet Take 81 mg by mouth daily.   Yes [provider]  carvedilol (COREG) 3.125 MG tablet Take 1 tablet (3.125 mg total) by mouth 2 (two) times daily with a meal. 11/08/18  Yes Salary, Montell D, MD  Cholecalciferol (VITAMIN D) 2000 units tablet Take 2,000 Units by mouth daily.   Yes [provider]  KLOR-CON 10 10 MEQ tablet Take 10 mEq by mouth 2 (two) times daily.    Yes [provider]   LASIX 20 MG tablet TAKE ONE TABLET BY MOUTH 3 TIMES DAILY Patient taking differently: Take 20 mg by mouth 3 (three) times daily.  10/14/18  Yes Gollan, Kathlene November, MD  losartan (COZAAR) 25 MG tablet TAKE 1/2 TABLET EVERY DAY Patient taking differently: Take 12.5 mg by mouth daily.  09/11/18  Yes Gollan, Kathlene November, MD  Menthol (ICY HOT) 5 % PTCH Apply 1 patch topically daily as needed (pain).   Yes [provider]  metolazone (ZAROXOLYN) 5 MG tablet Take 5 mg by mouth daily as needed (severe fluid retention).   Yes [provider]  Probiotic Product (RISA-BID PROBIOTIC PO) Take 1 capsule by mouth daily.    Yes [provider]  spironolactone (ALDACTONE) 25 MG tablet Take 25 mg by mouth daily.    Yes [provider]  vitamin C (ASCORBIC ACID) 500 MG tablet Take 500 mg by mouth daily.   Yes [provider]  warfarin (COUMADIN) 6 MG tablet Take 3-6 mg by mouth See admin instructions. Take 1 tablet (6mg ) by mouth every Monday, Wednesday,  Friday evening and take  tablet (3mg ) by mouth every Tuesday, Thursday, Saturday and Sunday evening   Yes [provider]    Inpatient Medications: Scheduled Meds: . amiodarone  200 mg Oral Daily  . cholecalciferol  2,000 Units Oral Daily  . multivitamin with minerals  1 tablet Oral Daily  . potassium chloride  10 mEq Oral BID  . sodium chloride flush  3 mL Intravenous Q12H  . vitamin C  500 mg Oral Daily   Continuous Infusions: . sodium chloride    . sodium chloride     PRN Meds: sodium chloride, acetaminophen **OR** acetaminophen, ondansetron **OR** ondansetron (ZOFRAN) IV, oxyCODONE, polyethylene glycol, sodium chloride flush, traMADol, traZODone  Allergies:    Allergies  Allergen Reactions  . Ace Inhibitors     Cough  . Doxycycline Nausea Only  . Hydrocodone Itching and Nausea Only  . Mercury     Other reaction(s): Unknown  . Silver Dermatitis    Severe itching  . Cephalexin Rash  .  Clindamycin/Lincomycin Rash  . Nickel Rash  . Penicillins Rash    Has patient had a PCN reaction causing immediate rash, facial/tongue/throat swelling, SOB or lightheadedness with hypotension: YES Has patient had a PCN reaction causing severe rash involving mucus membranes or skin necrosis: NO Has patient had a PCN reaction that required hospitalization NO Has patient had a PCN reaction occurring within the last 10 years: NO If all of the above answers are "NO", then may proceed with Cephalosporin use.    Social History:   Social History   Socioeconomic History  . Marital status: Widowed    Spouse name: Not on file  . Number of children: Not on file  . Years of education: Not on file  . Highest education level: Not on file  Occupational History  . Not on file  Social Needs  . Financial resource strain: Not on file  . Food insecurity:    Worry: Not on file    Inability: Not on file  . Transportation needs:    Medical: Not on file    Non-medical: Not on file  Tobacco Use  . Smoking status: Former Smoker    Packs/day: 1.00    Years: 27.00    Pack years: 27.00    Types: Cigarettes    Last attempt to quit: 07/01/1980    Years since quitting: 38.3  . Smokeless tobacco: Never Used  . Tobacco comment: "quit smoking ~ 1980  Substance and Sexual Activity  . Alcohol use: Not Currently    Alcohol/week: 0.0 standard drinks    Comment: 04/10/2016 "I'll have a drink on holidays/special occasions"  . Drug use: No  . Sexual activity: Never  Lifestyle  . Physical activity:    Days per week: Not on file    Minutes per session: Not on file  . Stress: Not on file  Relationships  . Social connections:    Talks on phone: Not on file    Gets together: Not on file    Attends religious service: Not on file    Active member of club or organization: Not on file    Attends meetings of clubs or organizations: Not on file    Relationship status: Not on file  . Intimate partner violence:     Fear of current or ex partner: Not on file    Emotionally abused: Not on file    Physically abused: Not on file    Forced sexual activity: Not on file  Other Topics Concern  . Not on file  Social History Narrative  . Not on file    Family History:    Family History  Problem Relation Age of Onset  . Stroke Mother   . Renal Disease Father      ROS:  Please see the history of present illness. All other ROS reviewed and negative.     Physical Exam/Data:   Vitals:   11/10/18 0442 11/10/18 0714 11/10/18 1130 11/10/18 1153  BP:  (!) 105/53 (!) 104/58 (!) 98/57  Pulse:  61 (!) 59 (!) 59  Resp:  19 18 18   Temp:  97.7 F (36.5 C) 98 F (36.7 C) 97.8 F (36.6 C)  TempSrc:  Oral Oral Oral  SpO2:  95% 99% 99%  Weight: 68.1 kg     Height:        Intake/Output Summary (Last 24 hours) at 11/10/2018 1500 Last data filed at 11/10/2018 0646 Gross per 24 hour  Intake 3 ml  Output 400 ml  Net -397 ml   Last 3 Weights 11/10/2018 11/09/2018 11/09/2018  Weight (lbs) 150 lb 1.6 oz 148 lb 149 lb  Weight (kg) 68.085 kg 67.132 kg 67.586 kg  Some encounter information is confidential and restricted. Go to Review Flowsheets activity to see all data.     Body mass index is 20.93 kg/m.  General:  Thin, elderly woman lying in bed.  Her daughter is at the bedside. HEENT: normal Lymph: no adenopathy Neck: JVP ~8 cm. Endocrine:  No thryomegaly Cardiac:  RRR with 2/6 systolic murmur.  Normal S1 and S2. Lungs:  Fair air movement with diminished breath sounds at both lung bases. Abd: soft, nontender, no hepatomegaly  Ext: Trace RLE edema.  Left calf wrapped with elastic bandage. Musculoskeletal:  No deformities, BUE and BLE strength normal and equal Skin: warm and dry  Neuro:  CNs 2-12 intact, no focal abnormalities noted Psych:  Normal affect   EKG:  The EKG from 11/06/2018 was personally reviewed and demonstrates:  AV paced rhythm Telemetry:  Telemetry was personally reviewed and demonstrates:   AV pacing.  Relevant CV Studies: Echocardiogram (11/05/2018):  1. The left ventricle has severely reduced systolic function, with an ejection fraction of 25-30%. The cavity size was mildly dilated. Left ventricular diastolic Doppler parameters are consistent with impaired relaxation. Left ventrical global  hypokinesis without regional wall motion abnormalities.  2. The right ventricle has mildly reduced systolic function. The cavity was moderately enlarged. There is no increase in right ventricular wall thickness. Right ventricular systolic pressure is moderately elevated with an estimated pressure of 53 mmHg.  3. Right atrial size was moderately dilated.  4. Left atrial size was moderately dilated.  5. The aortic valve was not well visualized. Moderate calcification of the aortic valve. Aortic valve regurgitation was not assessed by color flow Doppler. Unable to estimate adegree of aortic valve stenosis given severely reduced systolic function.  6. Mechanical mitral valve. Mean gradient 5 mm Hg.  7. Tricuspid valve regurgitation is moderate.  8. Left pleural effusion noted, 6.5 cm  9. Rhythm is atrial tachycardia, rate 105 bpm  Laboratory Data:  Chemistry Recent Labs  Lab 11/08/18 0500 11/09/18 0148 11/09/18 0600  NA 133* 134* 135  K 3.9 3.8 3.9  CL 97* 100 100  CO2 27 27 28   GLUCOSE 89 129* 116*  BUN 106* 101* 100*  CREATININE 2.41* 2.38* 2.19*  CALCIUM 9.0 8.5* 8.3*  GFRNONAA 18* 19* 20*  GFRAA 21* 21*  24*  ANIONGAP 9 7 7     Recent Labs  Lab 11/04/18 1141 11/09/18 0148  PROT 7.1 6.0*  ALBUMIN 4.3  4.4 3.6  AST 25 18  ALT 25 18  ALKPHOS 84 61  BILITOT 1.1 0.7   Hematology Recent Labs  Lab 11/09/18 0148 11/09/18 0600 11/09/18 1641 11/10/18 0335  WBC 5.3 6.1  --  6.4  RBC 3.18* 3.17*  --  2.71*  HGB 8.9* 9.0* 8.2* 7.7*  HCT 28.9* 28.3* 25.5* 24.4*  MCV 90.9 89.3  --  90.0  MCH 28.0 28.4  --  28.4  MCHC 30.8 31.8  --  31.6  RDW 17.5* 17.2*  --  17.5*  PLT  117* 107*  --  114*   Cardiac Enzymes Recent Labs  Lab 11/04/18 1141  TROPONINI 0.03*   No results for input(s): TROPIPOC in the last 168 hours.  BNP Recent Labs  Lab 11/04/18 1141  BNP 505.0*    DDimer No results for input(s): DDIMER in the last 168 hours.  Radiology/Studies:  No results found.  Assessment and Plan:   Chronic systolic and diastolic heart failure: Symptoms appear to be stable.  Volume exam is challenging, but only trace RLE edema is noted.  Weight is actually down 4 pounds from prior d/c on 5/10.  Given that patient is receiving blood products, she is at risk for transfusion associated circulatory overload (TACO).  Maintain net even fluid balance.  PRN furosemide may be needed based on urine output.  Carvedilol, spironolactone, and losartan currently on hold due to soft blood pressure in the setting of acute blood loss.  These should be carefully added back as blood pressure tolerates.  Paroxysmal atrial fibrillation/atrial tachycardia: Patient maintaining AV paced rhythm following DCCV last week.  Patient is at elevated risk for thromboembolic events, though recent left calf hematoma will complicate anticoagulation going forward.  Continue amiodarone.  Recommend restarting anticoagulation as soon as it is safe to do so from a vascular surgery perspective.  Would advocate for initiation of heparin infusion and addition of warfarin if there is no evidence of recurrent bleeding on therapeutic heparin.  NOAC cannot be used, given history of mechanical MVR.  H/o mechanical MVR: No evidence of prosthesis dysfunction, though patient is at very high risk for thrombotic events if anticoagulation is held for an extended period.  INR still therapeutic today.  If it drops below 2, I would recommend initiation of heparin infusion once cleared by vascular surgery until INR is therapeutic again.  May need to target slightly lower INR (2-3), at least in the short term.  Left  calf hematoma: Patient denies trauma.  Notably, INR was not significantly elevated during recent hospitalization or at the time of this admission.  Warfarin currently on hold.  Continue management per vascular surgery.  If no evidence of further bleeding/expansion, I would favor judicious use of heparin infusion and reinitiation of warfarin when possible to maintain INR of 2-3.    For questions or updates, please contact [place] Please consult [email or web address] for contact info under [facility] Card[facility].  Signed, Nelva Bush, MD  11/10/2018 3:00 PM

## 2018-11-10 NOTE — Progress Notes (Signed)
Daughter concerned about patient kidney function requests nephrology consult, MD notified, states they will ask nephrologist to see her tomorrow, this RN will contact [name], daughter, to inform.

## 2018-11-10 NOTE — Progress Notes (Signed)
Daughter, [name] called by this RN with update on patient.

## 2018-11-10 NOTE — Progress Notes (Signed)
[facility] [place] NAME: [name]    MR#:  [id number]  DATE OF BIRTH:  10-Mar-1935  SUBJECTIVE:   Patient states that her pain is much improved today.  She has noticed continued bleeding from her left lower extremity.  She denies any nausea or vomiting.  Denies any chest pain, shortness of breath, palpitations.  REVIEW OF SYSTEMS:  Review of Systems  Constitutional: Negative for chills and fever.  HENT: Negative for congestion and sore throat.   Eyes: Negative for blurred vision and double vision.  Respiratory: Negative for cough and shortness of breath.   Cardiovascular: Negative for chest pain and palpitations.  Gastrointestinal: Negative for nausea and vomiting.  Genitourinary: Negative for dysuria and urgency.  Musculoskeletal: Negative for back pain and neck pain.  Neurological: Negative for dizziness and headaches.  Endo/Heme/Allergies: Bruises/bleeds easily.  Psychiatric/Behavioral: Negative for depression and suicidal ideas.    DRUG ALLERGIES:   Allergies  Allergen Reactions  . Ace Inhibitors     Cough  . Doxycycline Nausea Only  . Hydrocodone Itching and Nausea Only  . Mercury     Other reaction(s): Unknown  . Silver Dermatitis    Severe itching  . Cephalexin Rash  . Clindamycin/Lincomycin Rash  . Nickel Rash  . Penicillins Rash    Has patient had a PCN reaction causing immediate rash, facial/tongue/throat swelling, SOB or lightheadedness with hypotension: YES Has patient had a PCN reaction causing severe rash involving mucus membranes or skin necrosis: NO Has patient had a PCN reaction that required hospitalization NO Has patient had a PCN reaction occurring within the last 10 years: NO If all of the above answers are "NO", then may proceed with Cephalosporin use.   VITALS:  Blood pressure (!) 98/57, pulse (!) 59, temperature 97.8 F (36.6 C), temperature source Oral, resp. rate 18, height 5\' 11"  (1.803  m), weight 68.1 kg, SpO2 99 %. PHYSICAL EXAMINATION:  Physical Exam  GENERAL:  Laying in the bed with no acute distress.  Thin appearing. HEENT: Head atraumatic, normocephalic. Pupils equal, round, reactive to light and accommodation. No scleral icterus. Extraocular muscles intact. Oropharynx and nasopharynx clear.  NECK:  Supple, no jugular venous distention. No thyroid enlargement. LUNGS: + Diminished breath sounds in the lung bases bilaterally.  No crackles, wheezing, rhonchi. No use of accessory muscles of respiration.  CARDIOVASCULAR: RRR, S1, S2 normal. No murmurs, rubs, or gallops.  ABDOMEN: Soft, nontender, nondistended. Bowel sounds present.  EXTREMITIES: No pedal edema, cyanosis, or clubbing. + Large ecchymoses over left lower extremity.  Bandage in place with bloody drainage. NEUROLOGIC: CN 2-12 intact, no focal deficits. 5/5 muscle strength throughout all extremities. Sensation intact throughout. Gait not checked.  PSYCHIATRIC: The patient is alert and oriented x 3.  SKIN: No obvious rash, lesion, or ulcer.  LABORATORY PANEL:  Female CBC Recent Labs  Lab 11/10/18 0335  WBC 6.4  HGB 7.7*  HCT 24.4*  PLT 114*   ------------------------------------------------------------------------------------------------------------------ Chemistries  Recent Labs  Lab 11/09/18 0148 11/09/18 0600  NA 134* 135  K 3.8 3.9  CL 100 100  CO2 27 28  GLUCOSE 129* 116*  BUN 101* 100*  CREATININE 2.38* 2.19*  CALCIUM 8.5* 8.3*  AST 18  --   ALT 18  --   ALKPHOS 61  --   BILITOT 0.7  --    RADIOLOGY:  No results found. ASSESSMENT AND PLAN:   Left lower extremity hematoma- likely due to trauma in  the setting of supratherapeutic INR on Coumadin. -Vascular surgery following- will reassess today regarding need for debridement -Will give a dose of IV vitamin K 5 mg x 1 today -Continue holding Coumadin  Acute blood loss anemia- hemoglobin dropped to 7.7 today. -Will give another unit  PRBC -Check post-transfusion H/H and check hemoglobin again in the morning  History of mitral valve replacement on Coumadin -Cardiology consulted per family request -Holding Coumadin in the setting of large left lower extremity hematoma  Chronic respiratory failure secondary to chronic combined systolic and diastolic CHF- no evidence of volume overload.  Currently stable on home 2 L O2. -Continue Coreg and spironolactone -Holding Coumadin -Cardiology consulted  Paroxysmal atrial fibrillation- currently in normal sinus rhythm -Continue amiodarone -Holding Coumadin  Daughter at bedside.  Updated on the plan.  All the records are reviewed and case discussed with Care Management/Social Worker. Management plans discussed with the patient, family and they are in agreement.  CODE STATUS: Full Code  TOTAL TIME TAKING CARE OF THIS PATIENT: 45 minutes.   More than 50% of the time was spent in counseling/coordination of care: YES  POSSIBLE D/C IN 2-3 DAYS, DEPENDING ON CLINICAL CONDITION.   [name]  M.D on 11/10/2018 at 12:42 PM  Between 7am to 6pm - Pager - [phone]  After 6pm go to [email or web address] - Proofreader  [facility]  Office  [phone]  CC: Primary care physician; Carrie Aus, MD  Note: This dictation was prepared with Dragon dictation along with smaller phrase technology. Any transcriptional errors that result from this process are unintentional.

## 2018-11-10 NOTE — Progress Notes (Signed)
Labs reviewed. Patient more comfortable.  No SOB at rest. Lungs: Clear. Cardio: RR w/ modest 1-2/ 6 murmur. Extrem: Small amount of blood on dressing.  Wound: No odor.  No extension of ecchymosis. Mepitel intact. IMP: No significant bleeding. Plan: Debridement w/ sedation tomorrow to remove all old blood to minimize infection. Likely biologic wound dressing.  Discussed with patient and daughter.

## 2018-11-10 NOTE — Progress Notes (Signed)
Patient I/O'd, bathed, linen changed, Dr Fleet Contras by bedside with daughter and this RN, as well as [place] Dragon, A.D.

## 2018-11-11 ENCOUNTER — Inpatient Hospital Stay: Payer: Medicare Other | Admitting: Registered Nurse

## 2018-11-11 ENCOUNTER — Encounter
Admission: EM | Disposition: A | Discharge status: Skilled Nursing Facility | Payer: Self-pay | Source: Home / Self Care | Attending: Internal Medicine

## 2018-11-11 DIAGNOSIS — D62 Acute posthemorrhagic anemia: Secondary | ICD-10-CM

## 2018-11-11 DIAGNOSIS — I5022 Chronic systolic (congestive) heart failure: Secondary | ICD-10-CM

## 2018-11-11 DIAGNOSIS — N183 Chronic kidney disease, stage 3 (moderate): Secondary | ICD-10-CM

## 2018-11-11 DIAGNOSIS — S81812D Laceration without foreign body, left lower leg, subsequent encounter: Secondary | ICD-10-CM

## 2018-11-11 HISTORY — PX: IRRIGATION AND DEBRIDEMENT HEMATOMA: SHX5254

## 2018-11-11 LAB — BPAM RBC
Blood Product Expiration Date: 202005142359
Blood Product Expiration Date: 202005182359
Blood Product Expiration Date: 202005182359
ISSUE DATE / TIME: 202005110212
ISSUE DATE / TIME: 202005110212
ISSUE DATE / TIME: 202005121125
Unit Type and Rh: 5100
Unit Type and Rh: 5100
Unit Type and Rh: 6200

## 2018-11-11 LAB — BASIC METABOLIC PANEL
Anion gap: 9 (ref 5–15)
BUN: 82 mg/dL — ABNORMAL HIGH (ref 8–23)
CO2: 28 mmol/L (ref 22–32)
Calcium: 8.8 mg/dL — ABNORMAL LOW (ref 8.9–10.3)
Chloride: 99 mmol/L (ref 98–111)
Creatinine, Ser: 1.95 mg/dL — ABNORMAL HIGH (ref 0.44–1.00)
GFR calc Af Amer: 27 mL/min — ABNORMAL LOW (ref >60)
GFR calc non Af Amer: 24 mL/min — ABNORMAL LOW (ref >60)
Glucose, Bld: 116 mg/dL — ABNORMAL HIGH (ref 70–99)
Potassium: 4.4 mmol/L (ref 3.5–5.1)
Sodium: 136 mmol/L (ref 135–145)

## 2018-11-11 LAB — TYPE AND SCREEN
ABO/RH(D): A POS
Antibody Screen: NEGATIVE
Unit division: 0
Unit division: 0
Unit division: 0

## 2018-11-11 LAB — CBC
HCT: 28.2 % — ABNORMAL LOW (ref 36.0–46.0)
Hemoglobin: 9 g/dL — ABNORMAL LOW (ref 12.0–15.0)
MCH: 28.8 pg (ref 26.0–34.0)
MCHC: 31.9 g/dL (ref 30.0–36.0)
MCV: 90.4 fL (ref 80.0–100.0)
Platelets: 120 10*3/uL — ABNORMAL LOW (ref 150–400)
RBC: 3.12 MIL/uL — ABNORMAL LOW (ref 3.87–5.11)
RDW: 17.1 % — ABNORMAL HIGH (ref 11.5–15.5)
WBC: 6.8 10*3/uL (ref 4.0–10.5)
nRBC: 0 % (ref 0.0–0.2)

## 2018-11-11 LAB — HEMOGLOBIN AND HEMATOCRIT, BLOOD
HCT: 30.3 % — ABNORMAL LOW (ref 36.0–46.0)
Hemoglobin: 9 g/dL — ABNORMAL LOW (ref 12.0–15.0)

## 2018-11-11 LAB — PROTIME-INR
INR: 1.2 (ref 0.8–1.2)
Prothrombin Time: 14.7 seconds (ref 11.4–15.2)

## 2018-11-11 LAB — HEPARIN LEVEL (UNFRACTIONATED): Heparin Unfractionated: 0.59 IU/mL (ref 0.30–0.70)

## 2018-11-11 SURGERY — IRRIGATION AND DEBRIDEMENT HEMATOMA
Anesthesia: Monitor Anesthesia Care | Site: Leg Lower | Laterality: Left

## 2018-11-11 MED ORDER — FENTANYL CITRATE (PF) 100 MCG/2ML IJ SOLN
INTRAMUSCULAR | Status: AC
  Filled 2018-11-11: qty 2

## 2018-11-11 MED ORDER — HEPARIN (PORCINE) 25000 UT/250ML-% IV SOLN
1050.0000 [IU]/h | INTRAVENOUS | Status: DC
  Administered 2018-11-11 – 2018-11-14 (×4): 950 [IU]/h via INTRAVENOUS
  Administered 2018-11-15: 1050 [IU]/h via INTRAVENOUS
  Filled 2018-11-11 (×5): qty 250

## 2018-11-11 MED ORDER — PROPOFOL 10 MG/ML IV BOLUS
INTRAVENOUS | Status: AC
  Filled 2018-11-11: qty 20

## 2018-11-11 MED ORDER — BUPIVACAINE HCL (PF) 0.5 % IJ SOLN
INTRAMUSCULAR | Status: AC
  Filled 2018-11-11: qty 30

## 2018-11-11 MED ORDER — LIDOCAINE-EPINEPHRINE 1 %-1:100000 IJ SOLN
INTRAMUSCULAR | Status: AC
  Filled 2018-11-11: qty 1

## 2018-11-11 MED ORDER — ONDANSETRON HCL 4 MG/2ML IJ SOLN: 4.0000 mg | Freq: Once | INTRAMUSCULAR | Status: DC | PRN

## 2018-11-11 MED ORDER — FENTANYL CITRATE (PF) 100 MCG/2ML IJ SOLN
INTRAMUSCULAR | Status: AC
  Administered 2018-11-11: 09:00:00 25 ug via INTRAVENOUS
  Filled 2018-11-11: qty 2

## 2018-11-11 MED ORDER — WARFARIN SODIUM 6 MG PO TABS
6.0000 mg | ORAL_TABLET | Freq: Once | ORAL | Status: AC
  Administered 2018-11-11: 6 mg via ORAL
  Filled 2018-11-11: qty 1

## 2018-11-11 MED ORDER — PHENYLEPHRINE HCL (PRESSORS) 10 MG/ML IV SOLN
INTRAVENOUS | Status: DC | PRN
  Administered 2018-11-11 (×2): 100 ug via INTRAVENOUS

## 2018-11-11 MED ORDER — EPINEPHRINE PF 1 MG/ML IJ SOLN
INTRAMUSCULAR | Status: AC
  Filled 2018-11-11: qty 1

## 2018-11-11 MED ORDER — LIDOCAINE HCL (PF) 2 % IJ SOLN
INTRAMUSCULAR | Status: AC
  Filled 2018-11-11: qty 10

## 2018-11-11 MED ORDER — FENTANYL CITRATE (PF) 100 MCG/2ML IJ SOLN
INTRAMUSCULAR | Status: DC | PRN
  Administered 2018-11-11 (×4): 25 ug via INTRAVENOUS

## 2018-11-11 MED ORDER — SODIUM CHLORIDE 0.9 % IV SOLN: INTRAVENOUS | Status: DC

## 2018-11-11 MED ORDER — EPINEPHRINE (ANAPHYLAXIS) 30 MG/30ML IJ SOLN
INTRAMUSCULAR | Status: AC
  Filled 2018-11-11: qty 30

## 2018-11-11 MED ORDER — DEXMEDETOMIDINE HCL IN NACL 80 MCG/20ML IV SOLN
INTRAVENOUS | Status: AC
  Filled 2018-11-11: qty 20

## 2018-11-11 MED ORDER — WARFARIN - PHARMACIST DOSING INPATIENT
Freq: Every day | Status: DC
  Administered 2018-11-12 – 2018-11-29 (×7)

## 2018-11-11 MED ORDER — FENTANYL CITRATE (PF) 100 MCG/2ML IJ SOLN
25.0000 ug | INTRAMUSCULAR | Status: DC | PRN
  Administered 2018-11-13 (×2): 25 ug via INTRAVENOUS
  Administered 2018-11-13: 50 ug via INTRAVENOUS

## 2018-11-11 MED ORDER — FENTANYL CITRATE (PF) 100 MCG/2ML IJ SOLN
25.0000 ug | INTRAMUSCULAR | Status: DC | PRN
  Administered 2018-11-11 (×2): 25 ug via INTRAVENOUS

## 2018-11-11 MED ORDER — PROPOFOL 500 MG/50ML IV EMUL
INTRAVENOUS | Status: AC
  Filled 2018-11-11: qty 50

## 2018-11-11 MED ORDER — PROPOFOL 500 MG/50ML IV EMUL
INTRAVENOUS | Status: DC | PRN
  Administered 2018-11-11: 40 ug/kg/min via INTRAVENOUS

## 2018-11-11 MED ORDER — EPINEPHRINE PF 1 MG/ML IJ SOLN
INTRAMUSCULAR | Status: DC | PRN
  Administered 2018-11-11: 10 mL

## 2018-11-11 MED ORDER — DEXMEDETOMIDINE HCL IN NACL 200 MCG/50ML IV SOLN
INTRAVENOUS | Status: DC | PRN
  Administered 2018-11-11: .6 ug/kg/h via INTRAVENOUS

## 2018-11-11 MED ORDER — LACTATED RINGERS IV SOLN
INTRAVENOUS | Status: DC
  Administered 2018-11-11 – 2018-11-19 (×3): via INTRAVENOUS

## 2018-11-11 MED ORDER — HEPARIN BOLUS VIA INFUSION
3400.0000 [IU] | Freq: Once | INTRAVENOUS | Status: AC
  Administered 2018-11-11: 3400 [IU] via INTRAVENOUS
  Filled 2018-11-11: qty 3400

## 2018-11-11 SURGICAL SUPPLY — 37 items
BLADE SURG 15 STRL SS SAFETY (BLADE) ×3 IMPLANT
BNDG GAUZE 4.5X4.1 6PLY STRL (MISCELLANEOUS) ×9 IMPLANT
BRUSH SCRUB EZ 1% IODOPHOR (MISCELLANEOUS) ×3 IMPLANT
CANISTER SUCT 1200ML W/VALVE (MISCELLANEOUS) ×3 IMPLANT
CHLORAPREP W/TINT 26 (MISCELLANEOUS) IMPLANT
CLOSURE WOUND 1/2 X4 (GAUZE/BANDAGES/DRESSINGS) ×1
COVER WAND RF STERILE (DRAPES) ×3 IMPLANT
DRAPE LAPAROTOMY 100X77 ABD (DRAPES) ×3 IMPLANT
DRSG MEPITEL 4X7.2 (GAUZE/BANDAGES/DRESSINGS) ×9 IMPLANT
DRSG TEGADERM 4X4.75 (GAUZE/BANDAGES/DRESSINGS) ×3 IMPLANT
DRSG TELFA 3X8 NADH (GAUZE/BANDAGES/DRESSINGS) ×3 IMPLANT
ELECT REM PT RETURN 9FT ADLT (ELECTROSURGICAL) ×3
ELECTRODE REM PT RTRN 9FT ADLT (ELECTROSURGICAL) ×1 IMPLANT
GAUZE 4X4 16PLY RFD (DISPOSABLE) ×3 IMPLANT
GAUZE SPONGE 4X4 12PLY STRL (GAUZE/BANDAGES/DRESSINGS) ×3 IMPLANT
GLOVE BIO SURGEON STRL SZ7.5 (GLOVE) ×3 IMPLANT
GLOVE INDICATOR 8.0 STRL GRN (GLOVE) ×3 IMPLANT
GOWN STRL REUS W/ TWL LRG LVL3 (GOWN DISPOSABLE) ×2 IMPLANT
GOWN STRL REUS W/TWL LRG LVL3 (GOWN DISPOSABLE) ×4
GRAFT AMNIOTIC 6.0X3.0CM (Orthopedic Graft) ×12 IMPLANT
GRAFT HUMAN 3CMX3CMX9SQ (Tissue) ×2 IMPLANT
HUMAN GRAFT 3CMX3CMX9SQ (Tissue) ×6 IMPLANT
KIT TURNOVER KIT A (KITS) ×3 IMPLANT
LABEL OR SOLS (LABEL) ×3 IMPLANT
NEEDLE HYPO 25X1 1.5 SAFETY (NEEDLE) ×3 IMPLANT
NS IRRIG 500ML POUR BTL (IV SOLUTION) ×6 IMPLANT
PACK BASIN MINOR ARMC (MISCELLANEOUS) ×3 IMPLANT
PAD ABD DERMACEA PRESS 5X9 (GAUZE/BANDAGES/DRESSINGS) ×3 IMPLANT
SOL PREP PROV IODINE SCRUB 4OZ (MISCELLANEOUS) ×3 IMPLANT
STAPLER SKIN PROX 35W (STAPLE) ×3 IMPLANT
STOCKINETTE IMPERV 14X48 (MISCELLANEOUS) ×3 IMPLANT
STRIP CLOSURE SKIN 1/2X4 (GAUZE/BANDAGES/DRESSINGS) ×2 IMPLANT
SUT VIC AB 3-0 SH 27 (SUTURE) ×2
SUT VIC AB 3-0 SH 27X BRD (SUTURE) ×1 IMPLANT
SUT VIC AB 4-0 FS2 27 (SUTURE) ×3 IMPLANT
SWABSTK COMLB BENZOIN TINCTURE (MISCELLANEOUS) ×3 IMPLANT
SYR CONTROL 10ML (SYRINGE) ×3 IMPLANT

## 2018-11-11 NOTE — Transfer of Care (Signed)
Immediate Anesthesia Transfer of Care Note  Patient: [name]  Procedure(s) Performed: IRRIGATION AND DEBRIDEMENT LEFT LEG HEMATOMA (Left Leg Lower)  Patient Location: PACU  Anesthesia Type:General  Level of Consciousness: drowsy and patient cooperative  Airway & Oxygen Therapy: Patient Spontanous Breathing and Patient connected to face mask oxygen  Post-op Assessment: Report given to RN and Post -op Vital signs reviewed and stable  Post vital signs: Reviewed and stable  Last Vitals:  Vitals Value Taken Time  BP 91/44 11/11/2018  9:07 AM  Temp 36.7 C 11/11/2018  9:07 AM  Pulse 61 11/11/2018  9:08 AM  Resp 19 11/11/2018  9:08 AM  SpO2 100 % 11/11/2018  9:08 AM  Vitals shown include unvalidated device data.  Last Pain:  Vitals:   11/11/18 0655  TempSrc:   PainSc: 0-No pain      Patients Stated Pain Goal: 2 (16/10/96 0454)  Complications: No apparent anesthesia complications

## 2018-11-11 NOTE — Anesthesia Post-op Follow-up Note (Signed)
Anesthesia QCDR form completed.        

## 2018-11-11 NOTE — Plan of Care (Signed)
  Problem: Clinical Measurements: Goal: Respiratory complications will improve Outcome: Progressing Note:  On chronic 2L O2   Problem: Nutrition: Goal: Adequate nutrition will be maintained Outcome: Progressing   Problem: Coping: Goal: Level of anxiety will decrease Outcome: Progressing   Problem: Safety: Goal: Ability to remain free from injury will improve Outcome: Progressing   Problem: Elimination: Goal: Will not experience complications related to urinary retention Outcome: Not Progressing Note:  In & out cath pt once, pt unable to urinate on her own   Problem: Pain Managment: Goal: General experience of comfort will improve Outcome: Not Progressing Note:  Pt continues to have left leg pain, treated twice with tylenol

## 2018-11-11 NOTE — Consult Note (Signed)
[place] for Heparin and Warfarin Indication: atrial fibrillation  Allergies  Allergen Reactions  . Ace Inhibitors     Cough  . Doxycycline Nausea Only  . Hydrocodone Itching and Nausea Only  . Mercury     Other reaction(s): Unknown  . Silver Dermatitis    Severe itching  . Cephalexin Rash  . Clindamycin/Lincomycin Rash  . Nickel Rash  . Penicillins Rash    Has patient had a PCN reaction causing immediate rash, facial/tongue/throat swelling, SOB or lightheadedness with hypotension: YES Has patient had a PCN reaction causing severe rash involving mucus membranes or skin necrosis: NO Has patient had a PCN reaction that required hospitalization NO Has patient had a PCN reaction occurring within the last 10 years: NO If all of the above answers are "NO", then may proceed with Cephalosporin use.    Patient Measurements: Height: 4\' 11"  (149.9 cm) Weight: 150 lb (68 kg) IBW/kg (Calculated) : 43.2 Heparin Dosing Weight: 68 kg  Vital Signs: Temp: 97.8 F (36.6 C) (05/13 1011) Temp Source: Oral (05/13 1011) BP: 107/56 (05/13 1011) Pulse Rate: 60 (05/13 1011)  Labs: Recent Labs    11/09/18 0148 11/09/18 0600  11/10/18 0335 11/10/18 1639 11/11/18 0313  HGB 8.9* 9.0*   < > 7.7* 9.6* 9.0*  HCT 28.9* 28.3*   < > 24.4* 30.4* 28.2*  PLT 117* 107*  --  114*  --  120*  APTT 64* 53*  --   --   --   --   LABPROT 34.7* 32.3*  --  28.8*  --  14.7  INR 3.5* 3.2*  --  2.8*  --  1.2  CREATININE 2.38* 2.19*  --   --   --  1.95*   < > = values in this interval not displayed.    Estimated Creatinine Clearance: 19 mL/min (A) (by C-G formula based on SCr of 1.95 mg/dL (H)).   Medical History: Past Medical History:  Diagnosis Date  . Arthritis    "back, hands, knees" (04/10/2016)  . Chronic atrial fibrillation    a. On Coumadin  . Chronic back pain   . 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 EF 25% by echo in 08/2015 b. RHC in 08/2015 showed normal filling pressures  . Chronic kidney disease   . Compression fracture    "several; all in my back" (04/10/2016)  . GERD (gastroesophageal reflux disease)   . Hypertension   . Lymphedema   . Pacemaker 05/2017  . S/P MVR (mitral valve replacement)    a. MVR 1994 b. redo MVR in 08/2014 - on Coumadin  . Shortness of breath dyspnea     Medications:  Medications Prior to Admission  Medication Sig Dispense Refill Last Dose  . acetaminophen (TYLENOL) 650 MG CR tablet Take 650 mg by mouth every 8 (eight) hours as needed for pain.   prn at prn  . ALPRAZolam (XANAX) 0.25 MG tablet Take 0.25 mg by mouth at bedtime as needed for sleep.   prn at prn  . amiodarone (PACERONE) 200 MG tablet Take 1 tablet (200 mg total) by mouth daily. 90 tablet 3 11/08/2018 at Unknown time  . aspirin EC 81 MG tablet Take 81 mg by mouth daily.   11/08/2018 at Unknown time  . carvedilol (COREG) 3.125 MG tablet Take 1 tablet (3.125 mg total) by mouth 2 (two) times daily with a meal. 60 tablet 0 11/08/2018 at Unknown  time  . Cholecalciferol (VITAMIN D) 2000 units tablet Take 2,000 Units by mouth daily.   11/08/2018 at Unknown time  . KLOR-CON 10 10 MEQ tablet Take 10 mEq by mouth 2 (two) times daily.    11/08/2018 at Unknown time  . LASIX 20 MG tablet TAKE ONE TABLET BY MOUTH 3 TIMES DAILY (Patient taking differently: Take 20 mg by mouth 3 (three) times daily. ) 270 tablet 0 11/08/2018 at Unknown time  . losartan (COZAAR) 25 MG tablet TAKE 1/2 TABLET EVERY DAY (Patient taking differently: Take 12.5 mg by mouth daily. ) 30 tablet 3 11/08/2018 at Unknown time  . Menthol (ICY HOT) 5 % PTCH Apply 1 patch topically daily as needed (pain).   prn at prn  . metolazone (ZAROXOLYN) 5 MG tablet Take 5 mg by mouth daily as needed (severe fluid retention).   prn at prn  . Probiotic Product (RISA-BID PROBIOTIC PO) Take 1 capsule by mouth daily.    11/08/2018 at Unknown time  .  spironolactone (ALDACTONE) 25 MG tablet Take 25 mg by mouth daily.    11/08/2018 at Unknown time  . vitamin C (ASCORBIC ACID) 500 MG tablet Take 500 mg by mouth daily.   11/08/2018 at Unknown time  . warfarin (COUMADIN) 6 MG tablet Take 3-6 mg by mouth See admin instructions. Take 1 tablet (6mg ) by mouth every Monday, Wednesday, Friday evening and take  tablet (3mg ) by mouth every Tuesday, Thursday, Saturday and Sunday evening   11/08/2018 at Unknown time   Scheduled:  . [MAR Hold] amiodarone  200 mg Oral Daily  . [MAR Hold] cholecalciferol  2,000 Units Oral Daily  . [MAR Hold] multivitamin with minerals  1 tablet Oral Daily  . [MAR Hold] potassium chloride  10 mEq Oral BID  . [MAR Hold] sodium chloride flush  3 mL Intravenous Q12H  . [MAR Hold] vitamin C  500 mg Oral Daily   Infusions:  . [MAR Hold] sodium chloride    . [MAR Hold] sodium chloride    . sodium chloride    . lactated ringers 50 mL/hr at 11/11/18 0729   PRN: [MAR Hold] sodium chloride, [MAR Hold] acetaminophen **OR** [MAR Hold] acetaminophen, fentaNYL (SUBLIMAZE) injection, [MAR Hold] ondansetron **OR** [MAR Hold] ondansetron (ZOFRAN) IV, ondansetron (ZOFRAN) IV, [MAR Hold] oxyCODONE, [MAR Hold] polyethylene glycol, [MAR Hold] sodium chloride flush, [MAR Hold] traMADol, [MAR Hold] traZODone Anti-infectives (From admission, onward)   None      Assessment: Pharmacy was consulted to start heparin bridge with warfarin. Pt was on warfarin previously for atrial fibrillation with mechanical mitral valve. On admission patient's INR was 3.5 and patient had a hematoma in her leg. Pt went for I&D on 5/13. Vitamin K was given 5/12 and INR has came down to 1.2 for procedure. Pt is on amiodarone which can increase INR. Take 1 tablet (6mg ) by mouth every Monday, Wednesday, Friday evening and take  tablet (3mg ) by mouth every Tuesday, Thursday, Saturday and Sunday evening  Date INR Warfarin Dose  5/11 3.2 Held  5/12 2.8 Held and gave  Vitamin K IV  5/13 1.2 6 mg     Goal of Therapy:  INR 2-3 Heparin level 0.3-0.7 units/ml Monitor platelets by anticoagulation protocol: Yes   Plan:  Give 3400 units bolus x 1 and start heparin infusion at 950 units/hr. Check anti-Xa level in 8 hours and daily while on heparin. Continue to monitor H&H and platelets. INR subtherapeutic. Will start warfarin 6 mg tonight (home dose). Pt received  vitamin K so it will take a couple of days for the INR to start trending up. D/c heparin gtt once INR > 2. Pt had an elevated INR on admission - home regimen may need to be adjusted. Major DDI with amiodarone.   Oswald Hillock, PharmD, BCPS  11/11/2018,10:13 AM

## 2018-11-11 NOTE — Progress Notes (Signed)
PT Cancellation Note  Patient Details Name: EULALIA ELLERMAN MRN: [id number] DOB: [birth year]   Cancelled Treatment:    Reason Eval/Treat Not Completed: Other (comment)(Pt s/p surgery this AM, PT will evaluate pt when more medically appropriate and able to participate in exertional activity. )   Lieutenant Diego PT, DPT 3:47 PM,11/11/18 [phone]

## 2018-11-11 NOTE — Progress Notes (Signed)
[facility] [place] NAME: [name]    MR#:  [id number]  DATE OF BIRTH:  1935-04-11  SUBJECTIVE:   Patient had surgery this morning.  She states she is doing okay after surgery, she just feels a little bit sleepy.  She endorses only mild pain in her left lower extremity.  No other concerns right now.  REVIEW OF SYSTEMS:  Review of Systems  Constitutional: Negative for chills and fever.  HENT: Negative for congestion and sore throat.   Eyes: Negative for blurred vision and double vision.  Respiratory: Negative for cough and shortness of breath.   Cardiovascular: Negative for chest pain and palpitations.  Gastrointestinal: Negative for nausea and vomiting.  Genitourinary: Negative for dysuria and urgency.  Musculoskeletal: Negative for back pain and neck pain.  Neurological: Negative for dizziness and headaches.  Endo/Heme/Allergies: Bruises/bleeds easily.  Psychiatric/Behavioral: Negative for depression and suicidal ideas.    DRUG ALLERGIES:   Allergies  Allergen Reactions  . Ace Inhibitors     Cough  . Doxycycline Nausea Only  . Hydrocodone Itching and Nausea Only  . Mercury     Other reaction(s): Unknown  . Silver Dermatitis    Severe itching  . Cephalexin Rash  . Clindamycin/Lincomycin Rash  . Nickel Rash  . Penicillins Rash    Has patient had a PCN reaction causing immediate rash, facial/tongue/throat swelling, SOB or lightheadedness with hypotension: YES Has patient had a PCN reaction causing severe rash involving mucus membranes or skin necrosis: NO Has patient had a PCN reaction that required hospitalization NO Has patient had a PCN reaction occurring within the last 10 years: NO If all of the above answers are "NO", then may proceed with Cephalosporin use.   VITALS:  Blood pressure (!) 107/56, pulse 60, temperature 97.8 F (36.6 C), temperature source Oral, resp. rate 16, height 4\' 11"  (1.499 m), weight 68  kg, SpO2 97 %. PHYSICAL EXAMINATION:  Physical Exam  GENERAL:  Laying in the bed with no acute distress.  Thin appearing. HEENT: Head atraumatic, normocephalic. Pupils equal, round, reactive to light and accommodation. No scleral icterus. Extraocular muscles intact. Oropharynx and nasopharynx clear.  NECK:  Supple, no jugular venous distention. No thyroid enlargement. LUNGS: + Diminished breath sounds in the lung bases bilaterally.  No crackles, wheezing, rhonchi. No use of accessory muscles of respiration.  Nasal cannula in place. CARDIOVASCULAR: RRR, S1, S2 normal. No murmurs, rubs, or gallops.  ABDOMEN: Soft, nontender, nondistended. Bowel sounds present.  EXTREMITIES: No pedal edema, cyanosis, or clubbing. + Dry dressing in place over left lower extremity NEUROLOGIC: CN 2-12 intact, no focal deficits. + Global weakness. Sensation intact throughout. Gait not checked.  PSYCHIATRIC: The patient is alert and oriented x 3.  SKIN: No obvious rash, lesion, or ulcer.  LABORATORY PANEL:  Female CBC Recent Labs  Lab 11/11/18 0313  WBC 6.8  HGB 9.0*  HCT 28.2*  PLT 120*   ------------------------------------------------------------------------------------------------------------------ Chemistries  Recent Labs  Lab 11/09/18 0148  11/11/18 0313  NA 134*   < > 136  K 3.8   < > 4.4  CL 100   < > 99  CO2 27   < > 28  GLUCOSE 129*   < > 116*  BUN 101*   < > 82*  CREATININE 2.38*   < > 1.95*  CALCIUM 8.5*   < > 8.8*  AST 18  --   --   ALT 18  --   --  ALKPHOS 61  --   --   BILITOT 0.7  --   --    < > = values in this interval not displayed.   RADIOLOGY:  No results found. ASSESSMENT AND PLAN:   Left lower extremity hematoma- likely due to trauma in the setting of supratherapeutic INR on Coumadin. -Vascular surgery following- plan for debridement of hematoma and application of neox graft -Per vascular surgery, okay to start heparin gtt today -PT consult placed -Light  weight-bearing on left leg   Acute blood loss anemia- hemoglobin stable -s/p 2 units PRBC -Checking H/H bid  History of mechanical mitral valve replacement  -Cardiology consulted per family request -Restart heparin gtt with bridge to coumadin today  Chronic respiratory failure secondary to chronic combined systolic and diastolic CHF- no evidence of volume overload.  Currently stable on home 2 L O2. -Continue Coreg and spironolactone -Cardiology consulted  Paroxysmal atrial fibrillation- currently in normal sinus rhythm -Continue amiodarone -Heparin bridge to coumadin  Daughter at bedside.  Updated on the plan.  All the records are reviewed and case discussed with Care Management/Social Worker. Management plans discussed with the patient, family and they are in agreement.  CODE STATUS: Full Code  TOTAL TIME TAKING CARE OF THIS PATIENT: 45 minutes.   More than 50% of the time was spent in counseling/coordination of care: YES  POSSIBLE D/C IN 2-3 DAYS, DEPENDING ON CLINICAL CONDITION.   Berna Spare  M.D on 11/11/2018 at 2:33 PM  Between 7am to 6pm - Pager - [phone]  After 6pm go to [email or web address] - Proofreader  [facility]  Office  [phone]  CC: Primary care physician; Rusty Aus, MD  Note: This dictation was prepared with Dragon dictation along with smaller phrase technology. Any transcriptional errors that result from this process are unintentional.

## 2018-11-11 NOTE — Op Note (Signed)
Preoperative diagnosis: Hematoma left leg secondary to trauma.  Postoperative diagnosis: Same.  Operative procedure: Debridement of hematoma, application of Neox graft.  Operating Surgeon: Hervey Ard, MD.  Anesthesia: Sedation.  Estimated blood loss: 75 cc.  Clinical note: This 84 year old patient with mechanical mitral valve on chronic Coumadin suffered a traumatic injury to the lateral aspect of the left calf about 2 days ago.  By family description she developed a football sized swelling that spontaneously drained.  She was brought to the hospital and transfused.  Examination in the room showed a large amount of elevated skin and a thin film of retained clot on the traumatized skin.  She was brought to the operating for debridement.  Operative note: The patient tolerated sedation well.  The leg was elevated and prepped circumferentially from the ankle to above the knee with Betadine scrub and solution and draped.  The foot was wrapped and a pediatric drape applied proximally.  The old skin was elevated and it was found that there was a film of adherent clot uniformly across the wound which measured approximately 10 x 18 cm involving the anterior lateral to posterior/posterior medial aspect of the proximal two thirds of the calf.  The area was gently debrided.  Vessels were controlled electrocautery.  The area was irrigated with a dilute solution of peroxide and saline followed by saline.  A epinephrine soaked gauze was placed for hemostasis while the graft was prepared.  The Neox graft material was hydrated and fenestrated.  4-3 x 6 cm pieces and 2-3 x 3 cm places were used to cover the anterior lateral to medial aspect of the wound.  Mepitel was then placed over the graft and the area of the wound for which the graft material was not available.  (The entire hospital supply was utilized).  A bulky dressing with a Kerlix pad followed by wrapping the left lower extremity from the base of the  metatarsals to above the knee with Kerlix followed by a light Ace wrap.  The patient tolerated the procedure well and was taken to recovery room in stable condition.

## 2018-11-11 NOTE — Progress Notes (Signed)
ANTICOAGULATION CONSULT NOTE - Initial Consult  Pharmacy Consult for Heparin and Warfarin  Indication: atrial fibrillation  Allergies  Allergen Reactions  . Ace Inhibitors     Cough  . Doxycycline Nausea Only  . Hydrocodone Itching and Nausea Only  . Mercury     Other reaction(s): Unknown  . Silver Dermatitis    Severe itching  . Cephalexin Rash  . Clindamycin/Lincomycin Rash  . Nickel Rash  . Penicillins Rash    Has patient had a PCN reaction causing immediate rash, facial/tongue/throat swelling, SOB or lightheadedness with hypotension: YES Has patient had a PCN reaction causing severe rash involving mucus membranes or skin necrosis: NO Has patient had a PCN reaction that required hospitalization NO Has patient had a PCN reaction occurring within the last 10 years: NO If all of the above answers are "NO", then may proceed with Cephalosporin use.    Patient Measurements: Height: 4\' 11"  (149.9 cm) Weight: 150 lb (68 kg) IBW/kg (Calculated) : 43.2 Heparin Dosing Weight:   Vital Signs: Temp: 98.1 F (36.7 C) (05/13 1956) Temp Source: Oral (05/13 1956) BP: 99/52 (05/13 1956) Pulse Rate: 58 (05/13 1956)  Labs: Recent Labs    11/09/18 0148 11/09/18 0600  11/10/18 0335 11/10/18 1639 11/11/18 0313 11/11/18 2102  HGB 8.9* 9.0*   < > 7.7* 9.6* 9.0* 9.0*  HCT 28.9* 28.3*   < > 24.4* 30.4* 28.2* 30.3*  PLT 117* 107*  --  114*  --  120*  --   APTT 64* 53*  --   --   --   --   --   LABPROT 34.7* 32.3*  --  28.8*  --  14.7  --   INR 3.5* 3.2*  --  2.8*  --  1.2  --   HEPARINUNFRC  --   --   --   --   --   --  0.59  CREATININE 2.38* 2.19*  --   --   --  1.95*  --    < > = values in this interval not displayed.    Estimated Creatinine Clearance: 19 mL/min (A) (by C-G formula based on SCr of 1.95 mg/dL (H)).   Medical History: Past Medical History:  Diagnosis Date  . Arthritis    "back, hands, knees" (04/10/2016)  . Chronic atrial fibrillation    a. On Coumadin  .  Chronic back pain   . 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 EF 25% by echo in 08/2015 b. RHC in 08/2015 showed normal filling pressures  . Chronic kidney disease   . Compression fracture    "several; all in my back" (04/10/2016)  . GERD (gastroesophageal reflux disease)   . Hypertension   . Lymphedema   . Pacemaker 05/2017  . S/P MVR (mitral valve replacement)    a. MVR 1994 b. redo MVR in 08/2014 - on Coumadin  . Shortness of breath dyspnea     Medications:  Medications Prior to Admission  Medication Sig Dispense Refill Last Dose  . acetaminophen (TYLENOL) 650 MG CR tablet Take 650 mg by mouth every 8 (eight) hours as needed for pain.   prn at prn  . ALPRAZolam (XANAX) 0.25 MG tablet Take 0.25 mg by mouth at bedtime as needed for sleep.   prn at prn  . amiodarone (PACERONE) 200 MG tablet Take 1 tablet (200 mg total) by mouth daily. 90 tablet 3 11/08/2018 at Unknown time  . aspirin EC  81 MG tablet Take 81 mg by mouth daily.   11/08/2018 at Unknown time  . carvedilol (COREG) 3.125 MG tablet Take 1 tablet (3.125 mg total) by mouth 2 (two) times daily with a meal. 60 tablet 0 11/08/2018 at Unknown time  . Cholecalciferol (VITAMIN D) 2000 units tablet Take 2,000 Units by mouth daily.   11/08/2018 at Unknown time  . KLOR-CON 10 10 MEQ tablet Take 10 mEq by mouth 2 (two) times daily.    11/08/2018 at Unknown time  . LASIX 20 MG tablet TAKE ONE TABLET BY MOUTH 3 TIMES DAILY (Patient taking differently: Take 20 mg by mouth 3 (three) times daily. ) 270 tablet 0 11/08/2018 at Unknown time  . losartan (COZAAR) 25 MG tablet TAKE 1/2 TABLET EVERY DAY (Patient taking differently: Take 12.5 mg by mouth daily. ) 30 tablet 3 11/08/2018 at Unknown time  . Menthol (ICY HOT) 5 % PTCH Apply 1 patch topically daily as needed (pain).   prn at prn  . metolazone (ZAROXOLYN) 5 MG tablet Take 5 mg by mouth daily as needed (severe fluid retention).   prn at prn  .  Probiotic Product (RISA-BID PROBIOTIC PO) Take 1 capsule by mouth daily.    11/08/2018 at Unknown time  . spironolactone (ALDACTONE) 25 MG tablet Take 25 mg by mouth daily.    11/08/2018 at Unknown time  . vitamin C (ASCORBIC ACID) 500 MG tablet Take 500 mg by mouth daily.   11/08/2018 at Unknown time  . warfarin (COUMADIN) 6 MG tablet Take 3-6 mg by mouth See admin instructions. Take 1 tablet (6mg ) by mouth every Monday, Wednesday, Friday evening and take  tablet (3mg ) by mouth every Tuesday, Thursday, Saturday and Sunday evening   11/08/2018 at Unknown time    Assessment: Pharmacy was consulted to start heparin bridge with warfarin. Pt was on warfarin previously for atrial fibrillation with mechanical mitral valve. On admission patient's INR was 3.5 and patient had a hematoma in her leg. Pt went for I&D on 5/13. Vitamin K was given 5/12 and INR has came down to 1.2 for procedure. Pt is on amiodarone which can increase INR. Take 1 tablet (6mg ) by mouth every Monday, Wednesday, Friday evening and take  tablet (3mg ) by mouth every Tuesday, Thursday, Saturday and Sunday evening  Date INR Warfarin Dose  5/11 3.2 Held  5/12 2.8 Held and gave Vitamin K IV  5/13 1.2 6 mg     Goal of Therapy:  INR 2-3 Heparin level 0.3-0.7 units/ml Monitor platelets by anticoagulation protocol: Yes   Plan:  Give 3400 units bolus x 1 and start heparin infusion at 950 units/hr. Check anti-Xa level in 8 hours and daily while on heparin. Continue to monitor H&H and platelets. INR subtherapeutic. Will start warfarin 6 mg tonight (home dose). Pt received vitamin K so it will take a couple of days for the INR to start trending up. D/c heparin gtt once INR > 2. Pt had an elevated INR on admission - home regimen may need to be adjusted. Major DDI with amiodarone.   5/13:  HL @ 2100 = 0.59 Will continue pt on current rate and draw confirmation level in 8 hrs on 5/14 @ 0500.   Jevonte Clanton D 11/11/2018,9:58 PM

## 2018-11-11 NOTE — Anesthesia Preprocedure Evaluation (Signed)
Anesthesia Evaluation  Patient identified by MRN, date of birth, ID band Patient awake    Reviewed: Allergy & Precautions, H&P , NPO status , Patient's Chart, lab work & pertinent test results, reviewed documented beta blocker date and time   Airway Mallampati: II  TM Distance: >3 FB Neck ROM: full    Dental no notable dental hx. (+) Teeth Intact   Pulmonary neg pulmonary ROS, shortness of breath and with exertion, former smoker,    Pulmonary exam normal breath sounds clear to auscultation       Cardiovascular Exercise Tolerance: Poor hypertension, On Medications +CHF  negative cardio ROS  + dysrhythmias Atrial Fibrillation + pacemaker  Rhythm:regular Rate:Normal     Neuro/Psych negative neurological ROS  negative psych ROS   GI/Hepatic negative GI ROS, Neg liver ROS, GERD  Medicated,  Endo/Other  negative endocrine ROSdiabetes  Renal/GU CRFRenal disease     Musculoskeletal   Abdominal   Peds  Hematology negative hematology ROS (+)   Anesthesia Other Findings   Reproductive/Obstetrics negative OB ROS                             Anesthesia Physical Anesthesia Plan  ASA: IV  Anesthesia Plan: MAC   Post-op Pain Management:    Induction:   PONV Risk Score and Plan:   Airway Management Planned:   Additional Equipment:   Intra-op Plan:   Post-operative Plan:   Informed Consent: I have reviewed the patients History and Physical, chart, labs and discussed the procedure including the risks, benefits and alternatives for the proposed anesthesia with the patient or authorized representative who has indicated his/her understanding and acceptance.       Plan Discussed with: CRNA  Anesthesia Plan Comments:         Anesthesia Quick Evaluation

## 2018-11-11 NOTE — Progress Notes (Addendum)
Patient assisted to [facility] with 2 assist, and walker/pivot. Upon assessment, this RN noted a new slightly raised hematoma on the right upper calf near the lateral popliteal space. A foam dressing was applied and the area marked in order to monitor for changes. There was also some noted oozing from the left lower leg hematoma site through the ace bandage, this area was marked and this RN will continue to monitor.  Daughter [name] and patient inquiring about PT, this RN called PT at the bedside and informed them that PT does not typically work with patients on the same day of surgery of this nature. Kristen from PT informed that Beverlee Nims will see patient tomorrow.

## 2018-11-11 NOTE — Progress Notes (Signed)
Progress Note  Patient Name: [name] Date of Encounter: 11/11/2018  Primary Cardiologist: Carrie Rogue, MD   Subjective   Stable shortness of breath.  No chest pain.  Minimal LLE pain following debridement in the OR this AM.  Inpatient Medications    Scheduled Meds: . amiodarone  200 mg Oral Daily  . cholecalciferol  2,000 Units Oral Daily  . multivitamin with minerals  1 tablet Oral Daily  . potassium chloride  10 mEq Oral BID  . sodium chloride flush  3 mL Intravenous Q12H  . vitamin C  500 mg Oral Daily  . Warfarin - Pharmacist Dosing Inpatient   Does not apply q1800   Continuous Infusions: . sodium chloride    . sodium chloride    . sodium chloride    . heparin 950 Units/hr (11/11/18 1244)  . lactated ringers 50 mL/hr at 11/11/18 0729   PRN Meds: sodium chloride, acetaminophen **OR** acetaminophen, fentaNYL (SUBLIMAZE) injection, ondansetron **OR** ondansetron (ZOFRAN) IV, oxyCODONE, polyethylene glycol, sodium chloride flush, traMADol, traZODone   Vital Signs    Vitals:   11/11/18 0950 11/11/18 1011 11/11/18 1615 11/11/18 1726  BP: (!) 91/46 (!) 107/56 (!) 115/59 116/62  Pulse: (!) 58 60 62 62  Resp: 14 16 16    Temp: 97.9 F (36.6 C) 97.8 F (36.6 C) 97.7 F (36.5 C) 97.9 F (36.6 C)  TempSrc:  Oral Oral Oral  SpO2: 97% 97% 96% 99%  Weight:      Height:        Intake/Output Summary (Last 24 hours) at 11/11/2018 1949 Last data filed at 11/11/2018 1630 Gross per 24 hour  Intake 600 ml  Output 937 ml  Net -337 ml   Last 3 Weights 11/11/2018 11/11/2018 11/10/2018  Weight (lbs) 150 lb 150 lb 150 lb 1.6 oz  Weight (kg) 68.04 kg 68.04 kg 68.085 kg  Some encounter information is confidential and restricted. Go to Review Flowsheets activity to see all data.      Telemetry    AV-paced - Personally Reviewed  ECG    No new tracing  Physical Exam   GEN: No acute distress.   Neck: JVP ~8 cm Cardiac: RRR with 2/6 systolic murmur.  S1 and S2  noted Respiratory: Clear to auscultation bilaterally. GI: Soft, nontender, non-distended  MS: Trace right thigh edema.  LLE wrapped. Neuro:  Nonfocal  Psych: Normal affect   Labs    Chemistry Recent Labs  Lab 11/09/18 0148 11/09/18 0600 11/11/18 0313  NA 134* 135 136  K 3.8 3.9 4.4  CL 100 100 99  CO2 27 28 28   GLUCOSE 129* 116* 116*  BUN 101* 100* 82*  CREATININE 2.38* 2.19* 1.95*  CALCIUM 8.5* 8.3* 8.8*  PROT 6.0*  --   --   ALBUMIN 3.6  --   --   AST 18  --   --   ALT 18  --   --   ALKPHOS 61  --   --   BILITOT 0.7  --   --   GFRNONAA 19* 20* 24*  GFRAA 21* 24* 27*  ANIONGAP 7 7 9      Hematology Recent Labs  Lab 11/09/18 0600  11/10/18 0335 11/10/18 1639 11/11/18 0313  WBC 6.1  --  6.4  --  6.8  RBC 3.17*  --  2.71*  --  3.12*  HGB 9.0*   < > 7.7* 9.6* 9.0*  HCT 28.3*   < > 24.4* 30.4* 28.2*  MCV  89.3  --  90.0  --  90.4  MCH 28.4  --  28.4  --  28.8  MCHC 31.8  --  31.6  --  31.9  RDW 17.2*  --  17.5*  --  17.1*  PLT 107*  --  114*  --  120*   < > = values in this interval not displayed.    Cardiac EnzymesNo results for input(s): TROPONINI in the last 168 hours. No results for input(s): TROPIPOC in the last 168 hours.   BNPNo results for input(s): BNP, PROBNP in the last 168 hours.   DDimer No results for input(s): DDIMER in the last 168 hours.   Radiology    No results found.  Cardiac Studies    1. The left ventricle has severely reduced systolic function, with an ejection fraction of 25-30%. The cavity size was mildly dilated. Left ventricular diastolic Doppler parameters are consistent with impaired relaxation. Left ventrical global  hypokinesis without regional wall motion abnormalities.  2. The right ventricle has mildly reduced systolic function. The cavity was moderately enlarged. There is no increase in right ventricular wall thickness. Right ventricular systolic pressure is moderately elevated with an estimated pressure of 53 mmHg.  3.  Right atrial size was moderately dilated.  4. Left atrial size was moderately dilated.  5. The aortic valve was not well visualized. Moderate calcification of the aortic valve. Aortic valve regurgitation was not assessed by color flow Doppler. Unable to estimate adegree of aortic valve stenosis given severely reduced systolic function.  6. Mechanical mitral valve. Mean gradient 5 mm Hg.  7. Tricuspid valve regurgitation is moderate.  8. Left pleural effusion noted, 6.5 cm  9. Rhythm is atrial tachycardia, rate 105 bpm  Patient Profile     84 y.o. female with a hx of persistent atrial fibrillation s/p DCCV (most recently 12/05/5991), chronic systolic heart failure s/p CRT, mechanical MVR, mild aortic stenosis, CKD, and COPD with chronic respiratory failure with hypoxia on home O2, admitted with spontaneous left calf hematoma and acute blood loss anemia in the setting of chronic anticoagulation.  Assessment & Plan    LLE hematoma: Pain controlled following debridement in the OR today.  Hemoglobin stable at 9.0 this AM.  Continued management per surgery; they have ok'ed initiation of anticoagulation.  Atrial fibrillation and atrial tachycardia: Patient AV paced following DCCV last week.  INR subtherapeutic after receiving IV vitamin K yesterday.  Heparin restarted following debridement of hematoma this morning.  Warfarin restarted with dosing per pharmacy.  Patient will need heparin bridge.  It will likely take several days to reach therapeutic INR following administration of IV vitamin K.  Continue amiodarone 200 mg PO daily.  H/o mechanical MVR: Patient now on heparin, which will need to be continued until INR is therapeutic.  Given spontaneous calf hematoma, I favor targeting an INR of 2.0-3.0, at least in the short term to ensure that [name] does not have recurrent bleeding.  Chronic systolic heart failure: Chronic dyspnea is stable.  Patient appears euvolemic.  Maintain net even  fluid balance.  Chronic kidney disease: Creatinine stable.  Avoid nephrotoxic drugs.  Maintain net even fluid balance.  Acute blood loss anemia: Hemoglobin relatively stable.  Monitor closely.  Consider PRBC transfusion if hemoglobin < 8 or symptoms.   For questions or updates, please contact [place] Please consult [email or web address] for contact info under [facility] Cardiology.     Signed, Nelva Bush, MD  11/11/2018, 7:49 PM

## 2018-11-11 NOTE — Progress Notes (Signed)
Patient back to room after debridement of left leg hematoma this morning, this RN notified daughter, [name] of patient status and return to room. Patient resting at this time, p.o. tylenol given for 4/10 left leg pain. Patient offered water/drink, and asked if she would like a snack or breakfast she sates she would like to wait till lunch.

## 2018-11-12 ENCOUNTER — Encounter: Payer: Self-pay | Admitting: General Surgery

## 2018-11-12 ENCOUNTER — Telehealth: Payer: Medicare Other | Admitting: Family

## 2018-11-12 DIAGNOSIS — I420 Dilated cardiomyopathy: Secondary | ICD-10-CM

## 2018-11-12 DIAGNOSIS — D649 Anemia, unspecified: Secondary | ICD-10-CM

## 2018-11-12 LAB — CBC
HCT: 27.8 % — ABNORMAL LOW (ref 36.0–46.0)
Hemoglobin: 8.6 g/dL — ABNORMAL LOW (ref 12.0–15.0)
MCH: 28.5 pg (ref 26.0–34.0)
MCHC: 30.9 g/dL (ref 30.0–36.0)
MCV: 92.1 fL (ref 80.0–100.0)
Platelets: 130 10*3/uL — ABNORMAL LOW (ref 150–400)
RBC: 3.02 MIL/uL — ABNORMAL LOW (ref 3.87–5.11)
RDW: 17.1 % — ABNORMAL HIGH (ref 11.5–15.5)
WBC: 10.4 10*3/uL (ref 4.0–10.5)
nRBC: 0 % (ref 0.0–0.2)

## 2018-11-12 LAB — BASIC METABOLIC PANEL
Anion gap: 8 (ref 5–15)
BUN: 76 mg/dL — ABNORMAL HIGH (ref 8–23)
CO2: 28 mmol/L (ref 22–32)
Calcium: 8.7 mg/dL — ABNORMAL LOW (ref 8.9–10.3)
Chloride: 99 mmol/L (ref 98–111)
Creatinine, Ser: 2.07 mg/dL — ABNORMAL HIGH (ref 0.44–1.00)
GFR calc Af Amer: 25 mL/min — ABNORMAL LOW (ref >60)
GFR calc non Af Amer: 22 mL/min — ABNORMAL LOW (ref >60)
Glucose, Bld: 140 mg/dL — ABNORMAL HIGH (ref 70–99)
Potassium: 5.2 mmol/L — ABNORMAL HIGH (ref 3.5–5.1)
Sodium: 135 mmol/L (ref 135–145)

## 2018-11-12 LAB — PROTIME-INR
INR: 1.2 (ref 0.8–1.2)
Prothrombin Time: 14.6 seconds (ref 11.4–15.2)

## 2018-11-12 LAB — HEPARIN LEVEL (UNFRACTIONATED): Heparin Unfractionated: 0.4 IU/mL (ref 0.30–0.70)

## 2018-11-12 LAB — HEMOGLOBIN: Hemoglobin: 8.5 g/dL — ABNORMAL LOW (ref 12.0–15.0)

## 2018-11-12 MED ORDER — WARFARIN SODIUM 6 MG PO TABS
6.0000 mg | ORAL_TABLET | Freq: Once | ORAL | Status: AC
  Administered 2018-11-12: 6 mg via ORAL
  Filled 2018-11-12: qty 1

## 2018-11-12 MED ORDER — FUROSEMIDE 10 MG/ML IJ SOLN
40.0000 mg | Freq: Every day | INTRAMUSCULAR | Status: DC
  Administered 2018-11-12: 40 mg via INTRAVENOUS
  Filled 2018-11-12: qty 4

## 2018-11-12 MED ORDER — POLYETHYLENE GLYCOL 3350 17 G PO PACK
17.0000 g | PACK | Freq: Every day | ORAL | Status: DC
  Administered 2018-11-12 – 2018-11-15 (×2): 17 g via ORAL
  Filled 2018-11-12 (×11): qty 1

## 2018-11-12 NOTE — Progress Notes (Signed)
Progress Note  Patient Name: [name] Date of Encounter: 11/12/2018  Primary Cardiologist: Ida Rogue, MD   Subjective   C/o feeling "as if she is choking" with SOB this morning.  Also reporting decreased oral intake, as she has only been eating Jell-O.  No chest pain or further cardiac complaints.  Inpatient Medications    Scheduled Meds: . amiodarone  200 mg Oral Daily  . cholecalciferol  2,000 Units Oral Daily  . furosemide  40 mg Intravenous Daily  . multivitamin with minerals  1 tablet Oral Daily  . potassium chloride  10 mEq Oral BID  . sodium chloride flush  3 mL Intravenous Q12H  . vitamin C  500 mg Oral Daily  . warfarin  6 mg Oral ONCE-1800  . Warfarin - Pharmacist Dosing Inpatient   Does not apply q1800   Continuous Infusions: . sodium chloride    . sodium chloride    . sodium chloride    . heparin 950 Units/hr (11/12/18 1046)  . lactated ringers 50 mL/hr at 11/11/18 0729   PRN Meds: sodium chloride, acetaminophen **OR** acetaminophen, fentaNYL (SUBLIMAZE) injection, ondansetron **OR** ondansetron (ZOFRAN) IV, oxyCODONE, polyethylene glycol, sodium chloride flush, traMADol, traZODone   Vital Signs    Vitals:   11/12/18 0437 11/12/18 0442 11/12/18 0816 11/12/18 1239  BP: (!) 108/55  108/68 95/81  Pulse: (!) 59  (!) 59 60  Resp: 20  18   Temp: 98.9 F (37.2 C)  98.2 F (36.8 C)   TempSrc: Oral  Oral   SpO2: 94%  93%   Weight:  67.1 kg    Height:        Intake/Output Summary (Last 24 hours) at 11/12/2018 1305 Last data filed at 11/12/2018 0442 Gross per 24 hour  Intake 128.41 ml  Output 750 ml  Net -621.59 ml   Filed Weights   11/11/18 0320 11/11/18 0655 11/12/18 0442  Weight: 68 kg 68 kg 67.1 kg    Telemetry    AV paced- Personally Reviewed  ECG    No new tracings- Personally Reviewed  Physical Exam   GEN:  Frail and elderly female.  No acute distress.   Neck:  JVP ~ 8cm Cardiac: RRR, 2/6 systolic murmur. No rubs or  gallops.  Respiratory: Clear to auscultation bilaterally.  Bilateral rhonchi heard, clearing with cough. GI: Soft, nontender, non-distended  MS:  Trace lower extremity edema; left leg wrapped in ACE bandage following debridement of left leg hematoma with oozing / evidence of continued bleeding on ACE bandage and bedsheet this morning.  Neuro:  Nonfocal  Psych: Normal affect   Labs    Chemistry Recent Labs  Lab 11/09/18 0148 11/09/18 0600 11/11/18 0313  NA 134* 135 136  K 3.8 3.9 4.4  CL 100 100 99  CO2 27 28 28   GLUCOSE 129* 116* 116*  BUN 101* 100* 82*  CREATININE 2.38* 2.19* 1.95*  CALCIUM 8.5* 8.3* 8.8*  PROT 6.0*  --   --   ALBUMIN 3.6  --   --   AST 18  --   --   ALT 18  --   --   ALKPHOS 61  --   --   BILITOT 0.7  --   --   GFRNONAA 19* 20* 24*  GFRAA 21* 24* 27*  ANIONGAP 7 7 9      Hematology Recent Labs  Lab 11/10/18 0335  11/11/18 0313 11/11/18 2102 11/12/18 0500  WBC 6.4  --  6.8  --  10.4  RBC 2.71*  --  3.12*  --  3.02*  HGB 7.7*   < > 9.0* 9.0* 8.6*  HCT 24.4*   < > 28.2* 30.3* 27.8*  MCV 90.0  --  90.4  --  92.1  MCH 28.4  --  28.8  --  28.5  MCHC 31.6  --  31.9  --  30.9  RDW 17.5*  --  17.1*  --  17.1*  PLT 114*  --  120*  --  130*   < > = values in this interval not displayed.    Cardiac EnzymesNo results for input(s): TROPONINI in the last 168 hours. No results for input(s): TROPIPOC in the last 168 hours.   BNPNo results for input(s): BNP, PROBNP in the last 168 hours.   DDimer No results for input(s): DDIMER in the last 168 hours.   Radiology    No results found.  Cardiac Studies   TTE 11/05/2018 1. The left ventricle has severely reduced systolic function, with an ejection fraction of 25-30%. The cavity size was mildly dilated. Left ventricular diastolic Doppler parameters are consistent with impaired relaxation. Left ventrical global  hypokinesis without regional wall motion abnormalities. 2. The right ventricle has mildly  reduced systolic function. The cavity was moderately enlarged. There is no increase in right ventricular wall thickness. Right ventricular systolic pressure is moderately elevated with an estimated pressure of 53 mmHg. 3. Right atrial size was moderately dilated. 4. Left atrial size was moderately dilated. 5. The aortic valve was not well visualized. Moderate calcification of the aortic valve. Aortic valve regurgitation was not assessed by color flow Doppler. Unable to estimate adegree of aortic valve stenosis given severely reduced systolic function. 6. Mechanical mitral valve. Mean gradient 5 mm Hg. 7. Tricuspid valve regurgitation is moderate. 8. Left pleural effusion noted, 6.5 cm 9. Rhythm is atrial tachycardia, rate 105 bpm  Patient Profile     84 y.o. female with a history of persistent atrial fibrillation status post DCCV (most recently 4/0/9811), chronic systolic heart failure s/p CRT, mechanical MVR, mild aortic stenosis, CKD, and COPD with chronic respiratory failure and hypoxia on home O2, and admitted with spontaneous left calf hematoma and acute blood loss anemia in the setting of chronic anticoagulation.  Assessment & Plan    Left lower extremity hematoma -Pain controlled following debridement in OR 11/11/2018.  Daily CBC recommended, given must remain on heparin bridge in the setting of MVR.  Surgery on board with the initiation of anticoagulation. Continued management per surgery.    Atrial fibrillation and atrial tachycardia -AV paced following DCCV last week.  INR subtherapeutic after receiving IV vitamin K 11/10/2018.   --Heparin restarted following debridement of hematoma in setting of MVR.  Continue heparin bridge with warfarin restarted once therapeutic INR and with dosing per pharmacy.  Suspect it will take several days to reach therapeutic INR following administration of IV vitamin K. Considerations regarding INR as below.  --Continue amiodarone 200 mg p.o. daily.   History of mechanical MVR -Patient on heparin bridge and will need to be continued until INR therapeutic.  INR now 1.2. Given spontaneous calf hematoma, consider short term adjustment of INR goal to 2.0-3.0 and to reduce risk of recurrent bleeding, especially as Hgb now 8.6. (usual guideline recommended INR 2.5-3.5 in setting of MVR).  Chronic systolic heart failure -Dyspnea stable.  Appears euvolemic on exam.  Maintain net even fluid balance.  Kidney disease -Creatinine stable, pending today's BMET. Avoid nephrotoxic drugs.  Maintain net even fluid balance.  Acute blood loss anemia -Hemoglobin 8.6.  Monitor closely.  INR short term adjustment recommendation as above.  Consider PRBC transfusion if hemoglobin less than 8 or symptomatic.  Reduced oral intake --Consider ensure as patient reported reduced oral intake which will likely affect her recovery.  For questions or updates, please contact [place] Please consult [email or web address] for contact info under        Signed, Arvil Chaco, PA-C  11/12/2018, 1:05 PM

## 2018-11-12 NOTE — Progress Notes (Signed)
ANTICOAGULATION CONSULT NOTE - Initial Consult  Pharmacy Consult for Heparin and Warfarin  Indication: atrial fibrillation  Allergies  Allergen Reactions  . Ace Inhibitors     Cough  . Doxycycline Nausea Only  . Hydrocodone Itching and Nausea Only  . Mercury     Other reaction(s): Unknown  . Silver Dermatitis    Severe itching  . Cephalexin Rash  . Clindamycin/Lincomycin Rash  . Nickel Rash  . Penicillins Rash    Has patient had a PCN reaction causing immediate rash, facial/tongue/throat swelling, SOB or lightheadedness with hypotension: YES Has patient had a PCN reaction causing severe rash involving mucus membranes or skin necrosis: NO Has patient had a PCN reaction that required hospitalization NO Has patient had a PCN reaction occurring within the last 10 years: NO If all of the above answers are "NO", then may proceed with Cephalosporin use.    Patient Measurements: Height: 4\' 11"  (149.9 cm) Weight: 148 lb (67.1 kg) IBW/kg (Calculated) : 43.2 Heparin Dosing Weight:   Vital Signs: Temp: 98.9 F (37.2 C) (05/14 0437) Temp Source: Oral (05/14 0437) BP: 108/55 (05/14 0437) Pulse Rate: 59 (05/14 0437)  Labs: Recent Labs    11/09/18 0600  11/10/18 0335  11/11/18 0313 11/11/18 2102 11/12/18 0500  HGB 9.0*   < > 7.7*   < > 9.0* 9.0* 8.6*  HCT 28.3*   < > 24.4*   < > 28.2* 30.3* 27.8*  PLT 107*  --  114*  --  120*  --  130*  APTT 53*  --   --   --   --   --   --   LABPROT 32.3*  --  28.8*  --  14.7  --  14.6  INR 3.2*  --  2.8*  --  1.2  --  1.2  HEPARINUNFRC  --   --   --   --   --  0.59 0.40  CREATININE 2.19*  --   --   --  1.95*  --   --    < > = values in this interval not displayed.    Estimated Creatinine Clearance: 18.9 mL/min (A) (by C-G formula based on SCr of 1.95 mg/dL (H)).   Medical History: Past Medical History:  Diagnosis Date  . Arthritis    "back, hands, knees" (04/10/2016)  . Chronic atrial fibrillation    a. On Coumadin  . Chronic  back pain   . 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 EF 25% by echo in 08/2015 b. RHC in 08/2015 showed normal filling pressures  . Chronic kidney disease   . Compression fracture    "several; all in my back" (04/10/2016)  . GERD (gastroesophageal reflux disease)   . Hypertension   . Lymphedema   . Pacemaker 05/2017  . S/P MVR (mitral valve replacement)    a. MVR 1994 b. redo MVR in 08/2014 - on Coumadin  . Shortness of breath dyspnea     Medications:  Medications Prior to Admission  Medication Sig Dispense Refill Last Dose  . acetaminophen (TYLENOL) 650 MG CR tablet Take 650 mg by mouth every 8 (eight) hours as needed for pain.   prn at prn  . ALPRAZolam (XANAX) 0.25 MG tablet Take 0.25 mg by mouth at bedtime as needed for sleep.   prn at prn  . amiodarone (PACERONE) 200 MG tablet Take 1 tablet (200 mg total) by mouth daily. 90 tablet 3 11/08/2018  at Unknown time  . aspirin EC 81 MG tablet Take 81 mg by mouth daily.   11/08/2018 at Unknown time  . carvedilol (COREG) 3.125 MG tablet Take 1 tablet (3.125 mg total) by mouth 2 (two) times daily with a meal. 60 tablet 0 11/08/2018 at Unknown time  . Cholecalciferol (VITAMIN D) 2000 units tablet Take 2,000 Units by mouth daily.   11/08/2018 at Unknown time  . KLOR-CON 10 10 MEQ tablet Take 10 mEq by mouth 2 (two) times daily.    11/08/2018 at Unknown time  . LASIX 20 MG tablet TAKE ONE TABLET BY MOUTH 3 TIMES DAILY (Patient taking differently: Take 20 mg by mouth 3 (three) times daily. ) 270 tablet 0 11/08/2018 at Unknown time  . losartan (COZAAR) 25 MG tablet TAKE 1/2 TABLET EVERY DAY (Patient taking differently: Take 12.5 mg by mouth daily. ) 30 tablet 3 11/08/2018 at Unknown time  . Menthol (ICY HOT) 5 % PTCH Apply 1 patch topically daily as needed (pain).   prn at prn  . metolazone (ZAROXOLYN) 5 MG tablet Take 5 mg by mouth daily as needed (severe fluid retention).   prn at prn  . Probiotic  Product (RISA-BID PROBIOTIC PO) Take 1 capsule by mouth daily.    11/08/2018 at Unknown time  . spironolactone (ALDACTONE) 25 MG tablet Take 25 mg by mouth daily.    11/08/2018 at Unknown time  . vitamin C (ASCORBIC ACID) 500 MG tablet Take 500 mg by mouth daily.   11/08/2018 at Unknown time  . warfarin (COUMADIN) 6 MG tablet Take 3-6 mg by mouth See admin instructions. Take 1 tablet (6mg ) by mouth every Monday, Wednesday, Friday evening and take  tablet (3mg ) by mouth every Tuesday, Thursday, Saturday and Sunday evening   11/08/2018 at Unknown time    Assessment: Pharmacy was consulted to start heparin bridge with warfarin. Pt was on warfarin previously for atrial fibrillation with mechanical mitral valve. On admission patient's INR was 3.5 and patient had a hematoma in her leg. Pt went for I&D on 5/13. Vitamin K was given 5/12 and INR has came down to 1.2 for procedure. Pt is on amiodarone which can increase INR. Take 1 tablet (6mg ) by mouth every Monday, Wednesday, Friday evening and take  tablet (3mg ) by mouth every Tuesday, Thursday, Saturday and Sunday evening  Heparin infusion running @ 950 units/hr   Date INR Warfarin Dose  5/11 3.2 Held  5/12 2.8 Held and gave Vitamin K IV  5/13 1.2 6 mg     Goal of Therapy:  INR 2-3 Heparin level 0.3-0.7 units/ml Monitor platelets by anticoagulation protocol: Yes   Plan:  5/14 @ 0500 HL: 0.40. Level remains therapeutic. Will continue current infusion rate of 950 units/hr. Continue to check anti-Xa levels and CBCs daily while on heparin. Continue to monitor H&H and platelets.   INR subtherapeutic.  Pt received vitamin K so it will take a couple of days for the INR to start trending up. D/c heparin gtt once INR > 2. Pt had an elevated INR on admission - home regimen may need to be adjusted. Major DDI with amiodarone.    Pernell Dupre, PharmD, BCPS Clinical Pharmacist 11/12/2018 5:38 AM

## 2018-11-12 NOTE — Progress Notes (Signed)
Events of the day reviewed. Patient seen about 8 AM with no bleeding on dressing noted. Thru the day as heparin continued, blood noted on the lateral aspect of the wound.  No significant pain.  Noted local discomfort since last night.  The wound dressing was reinforced and a light secondary Ace wrap applied.  We will plan to return the patient to the operating room tomorrow for dressing change.  There was no discernible bleeding yesterday when the original umbilical cord graft was applied, but with the institution of heparin this may well have changed.  I am aware that the patient must be on anticoagulation because of her mechanical heart valve, and a dressing change in the operating room will allow intervention if needed for any discernible vessel bleeding.  I have arranged for additional umbilical cord graft material to be available with the hopes of providing additional coverage of the wound.  Plans were discussed with the patient and her daughter, [name].

## 2018-11-12 NOTE — Progress Notes (Signed)
ANTICOAGULATION CONSULT NOTE - Initial Consult  Pharmacy Consult for Heparin and Warfarin  Indication: atrial fibrillation  Allergies  Allergen Reactions  . Ace Inhibitors     Cough  . Doxycycline Nausea Only  . Hydrocodone Itching and Nausea Only  . Mercury     Other reaction(s): Unknown  . Silver Dermatitis    Severe itching  . Cephalexin Rash  . Clindamycin/Lincomycin Rash  . Nickel Rash  . Penicillins Rash    Has patient had a PCN reaction causing immediate rash, facial/tongue/throat swelling, SOB or lightheadedness with hypotension: YES Has patient had a PCN reaction causing severe rash involving mucus membranes or skin necrosis: NO Has patient had a PCN reaction that required hospitalization NO Has patient had a PCN reaction occurring within the last 10 years: NO If all of the above answers are "NO", then may proceed with Cephalosporin use.    Patient Measurements: Height: 4\' 11"  (149.9 cm) Weight: 148 lb (67.1 kg) IBW/kg (Calculated) : 43.2 Heparin Dosing Weight:   Vital Signs: Temp: 98.9 F (37.2 C) (05/14 0437) Temp Source: Oral (05/14 0437) BP: 108/55 (05/14 0437) Pulse Rate: 59 (05/14 0437)  Labs: Recent Labs    11/10/18 0335  11/11/18 0313 11/11/18 2102 11/12/18 0500  HGB 7.7*   < > 9.0* 9.0* 8.6*  HCT 24.4*   < > 28.2* 30.3* 27.8*  PLT 114*  --  120*  --  130*  LABPROT 28.8*  --  14.7  --  14.6  INR 2.8*  --  1.2  --  1.2  HEPARINUNFRC  --   --   --  0.59 0.40  CREATININE  --   --  1.95*  --   --    < > = values in this interval not displayed.    Estimated Creatinine Clearance: 18.9 mL/min (A) (by C-G formula based on SCr of 1.95 mg/dL (H)).   Medical History: Past Medical History:  Diagnosis Date  . Arthritis    "back, hands, knees" (04/10/2016)  . Chronic atrial fibrillation    a. On Coumadin  . Chronic back pain   . 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 EF 25% by echo in 08/2015  b. RHC in 08/2015 showed normal filling pressures  . Chronic kidney disease   . Compression fracture    "several; all in my back" (04/10/2016)  . GERD (gastroesophageal reflux disease)   . Hypertension   . Lymphedema   . Pacemaker 05/2017  . S/P MVR (mitral valve replacement)    a. MVR 1994 b. redo MVR in 08/2014 - on Coumadin  . Shortness of breath dyspnea     Medications:  Medications Prior to Admission  Medication Sig Dispense Refill Last Dose  . acetaminophen (TYLENOL) 650 MG CR tablet Take 650 mg by mouth every 8 (eight) hours as needed for pain.   prn at prn  . ALPRAZolam (XANAX) 0.25 MG tablet Take 0.25 mg by mouth at bedtime as needed for sleep.   prn at prn  . amiodarone (PACERONE) 200 MG tablet Take 1 tablet (200 mg total) by mouth daily. 90 tablet 3 11/08/2018 at Unknown time  . aspirin EC 81 MG tablet Take 81 mg by mouth daily.   11/08/2018 at Unknown time  . carvedilol (COREG) 3.125 MG tablet Take 1 tablet (3.125 mg total) by mouth 2 (two) times daily with a meal. 60 tablet 0 11/08/2018 at Unknown time  . Cholecalciferol (VITAMIN D)  2000 units tablet Take 2,000 Units by mouth daily.   11/08/2018 at Unknown time  . KLOR-CON 10 10 MEQ tablet Take 10 mEq by mouth 2 (two) times daily.    11/08/2018 at Unknown time  . LASIX 20 MG tablet TAKE ONE TABLET BY MOUTH 3 TIMES DAILY (Patient taking differently: Take 20 mg by mouth 3 (three) times daily. ) 270 tablet 0 11/08/2018 at Unknown time  . losartan (COZAAR) 25 MG tablet TAKE 1/2 TABLET EVERY DAY (Patient taking differently: Take 12.5 mg by mouth daily. ) 30 tablet 3 11/08/2018 at Unknown time  . Menthol (ICY HOT) 5 % PTCH Apply 1 patch topically daily as needed (pain).   prn at prn  . metolazone (ZAROXOLYN) 5 MG tablet Take 5 mg by mouth daily as needed (severe fluid retention).   prn at prn  . Probiotic Product (RISA-BID PROBIOTIC PO) Take 1 capsule by mouth daily.    11/08/2018 at Unknown time  . spironolactone (ALDACTONE) 25 MG tablet  Take 25 mg by mouth daily.    11/08/2018 at Unknown time  . vitamin C (ASCORBIC ACID) 500 MG tablet Take 500 mg by mouth daily.   11/08/2018 at Unknown time  . warfarin (COUMADIN) 6 MG tablet Take 3-6 mg by mouth See admin instructions. Take 1 tablet (6mg ) by mouth every Monday, Wednesday, Friday evening and take  tablet (3mg ) by mouth every Tuesday, Thursday, Saturday and Sunday evening   11/08/2018 at Unknown time    Assessment: Pharmacy was consulted to start heparin bridge with warfarin. Pt was on warfarin previously for atrial fibrillation with mechanical mitral valve. Per coumadin clinic notes, patient's INR goal is 2.5 - 3.5. Her home evening dose of warfarin is: 6 mg MWF and 3 mg TThSaSun. On admission patient's INR was 3.5 and patient had a hematoma in her leg. Pt went for I&D on 5/13. Vitamin K was given 5/12 and INR has came down to 1.2 for procedure. Pt received vitamin K so it will take a couple of days for the INR to start trending up.  Drug interactions: Amiodarone which can increase INR.   Heparin infusion running @ 950 units/hr   Date INR Warfarin Dose  5/11 3.2 Held  5/12 2.8 Held and gave Vitamin K IV  5/13 1.2 6 mg  5/14 1.2  -----    Goal of Therapy:  INR 2-3, given recent bleed (per cardiology). Heparin level 0.3-0.7 units/ml Monitor platelets by anticoagulation protocol: Yes   Plan:  Heparin Treatment: 5/14 @ 0500 HL: 0.40. Level remains therapeutic. Will continue current infusion rate of 950 units/hr. Continue to check anti-Xa levels and CBCs daily while on heparin. Continue to monitor H&H and platelets.   Warfarin Treatment:  INR subtherapeutic- will give 6 mg (not home dose) today.  D/c heparin gtt once INR > 2. Major DDI with amiodarone.    Rowland Lathe, PharmD Clinical Pharmacist 11/12/2018 8:02 AM

## 2018-11-12 NOTE — TOC Progression Note (Signed)
Transition of Care [facility]) - Progression Note    Patient Details  Name: [name] MRN: [id number] Date of Birth: August 25, 1934  Transition of Care [facility]) CM/SW Contact  Elza Rafter, RN Phone Number: 11/12/2018, 10:39 AM  Clinical Narrative:   Tiajuana Amass to discharge- Vascular surgery following- plan for debridement of hematoma and application of neox graft.  Checking hgb BID.    Expected Discharge Plan: [place] Barriers to Discharge: Continued Medical Work up  Expected Discharge Plan and Services Expected Discharge Plan: [place]   Discharge Planning Services: CM Consult Post Acute Care Choice: [place] arrangements for the past 2 months: Single Family Home                           HH Arranged: RN, PT [facility] Agency: [place] ([facility]) Date HH Agency Contacted: 11/09/18 Time [place]: 1428 Representative spoke with at [place]: [place] ([place]) Interventions    Readmission Risk Interventions Readmission Risk Prevention Plan 11/09/2018  Transportation Screening Complete  PCP or Specialist Appt within 3-5 Days Complete  HRI or [place] Complete  Social Work Consult for [place] Planning/Counseling Not Complete  SW consult not completed comments na  Palliative Care Screening Not Applicable  Medication Review Press photographer) Complete  Some recent data might be hidden

## 2018-11-12 NOTE — Progress Notes (Signed)
PT Cancellation Note  Patient Details Name: [name] MRN: [id number] DOB: 1935-04-20   Cancelled Treatment:    Reason Eval/Treat Not Completed: Other (comment)(PT communicated with physician, pt with bleeding from LLE today, MD re-wrapped and plans for tomorrow AM redressing/debridement. PT to hold pt today, will follow up as able tomorrow PM. Pt, PT, and family in agreement.)   Lieutenant Diego PT, DPT 2:27 PM,11/12/18 [phone]

## 2018-11-12 NOTE — Care Management Important Message (Signed)
Important Message  Patient Details  Name: [name] MRN: [id number] Date of Birth: [birth year]   Medicare Important Message Given:  Yes    [name] 11/12/2018, 1:41 PM

## 2018-11-12 NOTE — Progress Notes (Signed)
[name] at [place] NAME: [name]    MR#:  [id number]  DATE OF BIRTH:  1934/09/30  SUBJECTIVE:  CHIEF COMPLAINT:   Chief Complaint  Patient presents with  . Leg Swelling   -Patient with left calf hematoma following a trauma while on Coumadin underwent debridement of hematoma on 11/11/2018. -Complains of cough and shortness of breath.  There is also some bleeding through the Ace wrap layers of the calf this morning  REVIEW OF SYSTEMS:  Review of Systems  Constitutional: Positive for malaise/fatigue. Negative for chills and fever.  HENT: Negative for congestion, ear discharge, hearing loss and nosebleeds.   Eyes: Negative for blurred vision and double vision.  Respiratory: Positive for cough and shortness of breath. Negative for wheezing.   Cardiovascular: Positive for leg swelling. Negative for chest pain and palpitations.  Gastrointestinal: Negative for abdominal pain, constipation, diarrhea, nausea and vomiting.  Genitourinary: Negative for dysuria.  Musculoskeletal: Negative for myalgias.  Neurological: Negative for dizziness, focal weakness, seizures, weakness and headaches.  Psychiatric/Behavioral: Negative for depression.    DRUG ALLERGIES:   Allergies  Allergen Reactions  . Ace Inhibitors     Cough  . Doxycycline Nausea Only  . Hydrocodone Itching and Nausea Only  . Mercury     Other reaction(s): Unknown  . Silver Dermatitis    Severe itching  . Cephalexin Rash  . Clindamycin/Lincomycin Rash  . Nickel Rash  . Penicillins Rash    Has patient had a PCN reaction causing immediate rash, facial/tongue/throat swelling, SOB or lightheadedness with hypotension: YES Has patient had a PCN reaction causing severe rash involving mucus membranes or skin necrosis: NO Has patient had a PCN reaction that required hospitalization NO Has patient had a PCN reaction occurring within the last 10 years: NO If all of the above  answers are "NO", then may proceed with Cephalosporin use.    VITALS:  Blood pressure 95/81, pulse 60, temperature 98.2 F (36.8 C), temperature source Oral, resp. rate 18, height 4\' 11"  (1.499 m), weight 67.1 kg, SpO2 93 %.  PHYSICAL EXAMINATION:  Physical Exam   GENERAL:  84 y.o.-year-old elderly patient lying in the bed with no acute distress.  EYES: Pupils equal, round, reactive to light and accommodation. No scleral icterus. Extraocular muscles intact.  HEENT: Head atraumatic, normocephalic. Oropharynx and nasopharynx clear.  NECK:  Supple, no jugular venous distention. No thyroid enlargement, no tenderness.  LUNGS: Normal breath sounds bilaterally, no wheezing, rhonchi or crepitation.  Decreased bibasilar breath sounds. no use of accessory muscles of respiration.  CARDIOVASCULAR: S1, S2 normal. No  rubs, or gallops.  3/6 systolic murmur is in place ABDOMEN: Soft, nontender, nondistended. Bowel sounds present. No organomegaly or mass.  EXTREMITIES: No pedal edema, cyanosis, or clubbing.  NEUROLOGIC: Cranial nerves II through XII are intact. Muscle strength 5/5 in all extremities. Sensation intact. Gait not checked.  Global weakness noted. PSYCHIATRIC: The patient is alert and oriented x 3.  SKIN: No obvious rash, lesion, or ulcer.       LABORATORY PANEL:   CBC Recent Labs  Lab 11/12/18 0500  WBC 10.4  HGB 8.6*  HCT 27.8*  PLT 130*   ------------------------------------------------------------------------------------------------------------------  Chemistries  Recent Labs  Lab 11/09/18 0148  11/11/18 0313  NA 134*   < > 136  K 3.8   < > 4.4  CL 100   < > 99  CO2 27   < > 28  GLUCOSE 129*   < >  116*  BUN 101*   < > 82*  CREATININE 2.38*   < > 1.95*  CALCIUM 8.5*   < > 8.8*  AST 18  --   --   ALT 18  --   --   ALKPHOS 61  --   --   BILITOT 0.7  --   --    < > = values in this interval not displayed.    ------------------------------------------------------------------------------------------------------------------  Cardiac Enzymes No results for input(s): TROPONINI in the last 168 hours. ------------------------------------------------------------------------------------------------------------------  RADIOLOGY:  No results found.  EKG:   Orders placed or performed during the hospital encounter of 11/04/18  . EKG 12-Lead  . EKG 12-Lead    ASSESSMENT AND PLAN:   84 year old female with past medical history significant for mitral valve replacement with a mechanical valve on chronic Coumadin, atrial fibrillation/flutter status post pacemaker, hypertension, chronic systolic heart failure, lymphedema, GERD and hypertension, pulmonary hypertension on chronic 2 L home oxygen brought from home secondary to left lower extremity hematoma.  1.  Left lower extremity hematoma-after a trauma while on Coumadin. -Appreciate surgical consult.  Patient had debridement of the left calf hematoma and a biological graft placed in on 11/11/2018. -Patient had some bleeding through the dressing today.  Surgery has been notified. -We will hold off on weightbearing today and reassess tomorrow.  2.  Acute blood loss anemia-secondary to bleeding into her hematoma -Received 2 units packed RBC transfusion this admission.  Hemoglobin at 8.6.  Recheck later today as she has some bleeding.  Low threshold for transfusion.  3.  Acute on chronic systolic heart failure exacerbation-complains of some dyspnea and cough today.  Remains on chronic 2 L oxygen. -Appreciate cardiology input.  Patient has a pacemaker, atrial flutter/fibrillation status post DC cardioversion last week. -Started on IV Lasix today.  Continue to monitor creatinine. -Last echo with EF of 25%  4.  History of mechanical mitral valve surgery-Coumadin has been restarted.  On heparin drip-and for the heparin levels due to ongoing bleeding.  5.   Hypokalemia-being replaced appropriately.  Daughter [name] has been updated at bedside Discussed plan of care with Dr. Rockey Situ and Dr. Bary Castilla   All the records are reviewed and case discussed with Care Management/Social Workerr. Management plans discussed with the patient, family and they are in agreement.  CODE STATUS: Full code  TOTAL TIME TAKING CARE OF THIS PATIENT: 43 minutes.   POSSIBLE D/C IN 3 DAYS, DEPENDING ON CLINICAL CONDITION.   Gladstone Lighter M.D on 11/12/2018 at 12:51 PM  Between 7am to 6pm - Pager - [phone]  After 6pm go to [email or web address] - password [email or web address] [facility]  Office  [phone]  CC: Primary care physician; Rusty Aus, MD

## 2018-11-12 NOTE — Progress Notes (Signed)
PT Cancellation Note  Patient Details Name: [name] MRN: [id number] DOB: 1935-05-11   Cancelled Treatment:    Reason Eval/Treat Not Completed: Other (comment)(PT entered room, pt requesting to wait until family is at bedside prior to mobilizing with PT. PT, pt, and pt daughter [name] spoke on the phone. Pt expressed wanting to wait for PT evaluation until PM when [name] can be present, [name] and PT agreeable.)   Lieutenant Diego PT, DPT 9:39 AM,11/12/18 [phone]

## 2018-11-12 NOTE — Plan of Care (Signed)
Continues on heparin gtt, INR 1.2 still this morning, left leg dressing still continues to ooze.  Problem: Clinical Measurements: Goal: Respiratory complications will improve Outcome: Progressing   Problem: Nutrition: Goal: Adequate nutrition will be maintained Outcome: Progressing   Problem: Coping: Goal: Level of anxiety will decrease Outcome: Progressing   Problem: Elimination: Goal: Will not experience complications related to urinary retention Outcome: Progressing Note:  On the purewick- and able to urinate on her own   Problem: Safety: Goal: Ability to remain free from injury will improve Outcome: Progressing   Problem: Pain Managment: Goal: General experience of comfort will improve Outcome: Not Progressing Note:  Still continues to have pain in left leg, treated with tramadol & oxycodone

## 2018-11-13 ENCOUNTER — Inpatient Hospital Stay: Payer: Medicare Other

## 2018-11-13 ENCOUNTER — Inpatient Hospital Stay: Payer: Medicare Other | Admitting: Anesthesiology

## 2018-11-13 ENCOUNTER — Encounter: Payer: Self-pay | Admitting: *Deleted

## 2018-11-13 ENCOUNTER — Encounter
Admission: EM | Disposition: A | Discharge status: Skilled Nursing Facility | Payer: Self-pay | Source: Home / Self Care | Attending: Internal Medicine

## 2018-11-13 DIAGNOSIS — S81812D Laceration without foreign body, left lower leg, subsequent encounter: Secondary | ICD-10-CM

## 2018-11-13 HISTORY — PX: INCISION AND DRAINAGE OF WOUND: SHX1803

## 2018-11-13 LAB — BASIC METABOLIC PANEL
Anion gap: 8 (ref 5–15)
BUN: 75 mg/dL — ABNORMAL HIGH (ref 8–23)
CO2: 28 mmol/L (ref 22–32)
Calcium: 8.5 mg/dL — ABNORMAL LOW (ref 8.9–10.3)
Chloride: 97 mmol/L — ABNORMAL LOW (ref 98–111)
Creatinine, Ser: 1.95 mg/dL — ABNORMAL HIGH (ref 0.44–1.00)
GFR calc Af Amer: 27 mL/min — ABNORMAL LOW (ref >60)
GFR calc non Af Amer: 24 mL/min — ABNORMAL LOW (ref >60)
Glucose, Bld: 102 mg/dL — ABNORMAL HIGH (ref 70–99)
Potassium: 5.1 mmol/L (ref 3.5–5.1)
Sodium: 133 mmol/L — ABNORMAL LOW (ref 135–145)

## 2018-11-13 LAB — CBC
HCT: 27.1 % — ABNORMAL LOW (ref 36.0–46.0)
Hemoglobin: 8.1 g/dL — ABNORMAL LOW (ref 12.0–15.0)
MCH: 28.5 pg (ref 26.0–34.0)
MCHC: 29.9 g/dL — ABNORMAL LOW (ref 30.0–36.0)
MCV: 95.4 fL (ref 80.0–100.0)
Platelets: 126 10*3/uL — ABNORMAL LOW (ref 150–400)
RBC: 2.84 MIL/uL — ABNORMAL LOW (ref 3.87–5.11)
RDW: 17.1 % — ABNORMAL HIGH (ref 11.5–15.5)
WBC: 9.6 10*3/uL (ref 4.0–10.5)
nRBC: 0 % (ref 0.0–0.2)

## 2018-11-13 LAB — PROTIME-INR
INR: 1.3 — ABNORMAL HIGH (ref 0.8–1.2)
Prothrombin Time: 16.2 seconds — ABNORMAL HIGH (ref 11.4–15.2)

## 2018-11-13 LAB — HEPARIN LEVEL (UNFRACTIONATED): Heparin Unfractionated: 0.42 IU/mL (ref 0.30–0.70)

## 2018-11-13 SURGERY — IRRIGATION AND DEBRIDEMENT WOUND
Anesthesia: General | Laterality: Left

## 2018-11-13 MED ORDER — SODIUM CHLORIDE 0.9 % IV SOLN
INTRAVENOUS | Status: DC | PRN
  Administered 2018-11-13: 13:00:00 via INTRAVENOUS

## 2018-11-13 MED ORDER — DEXMEDETOMIDINE HCL IN NACL 200 MCG/50ML IV SOLN
INTRAVENOUS | Status: DC | PRN
  Administered 2018-11-13: .7 ug/kg/h via INTRAVENOUS

## 2018-11-13 MED ORDER — WARFARIN SODIUM 6 MG PO TABS
6.0000 mg | ORAL_TABLET | Freq: Once | ORAL | Status: AC
  Administered 2018-11-13: 6 mg via ORAL
  Filled 2018-11-13: qty 1

## 2018-11-13 MED ORDER — BUPIVACAINE HCL (PF) 0.5 % IJ SOLN
INTRAMUSCULAR | Status: AC
  Filled 2018-11-13: qty 30

## 2018-11-13 MED ORDER — FENTANYL CITRATE (PF) 100 MCG/2ML IJ SOLN
INTRAMUSCULAR | Status: AC
  Filled 2018-11-13: qty 2

## 2018-11-13 MED ORDER — DEXMEDETOMIDINE HCL IN NACL 200 MCG/50ML IV SOLN
INTRAVENOUS | Status: DC | PRN
  Administered 2018-11-13 (×2): 4 ug via INTRAVENOUS

## 2018-11-13 MED ORDER — PROPOFOL 500 MG/50ML IV EMUL
INTRAVENOUS | Status: DC | PRN
  Administered 2018-11-13: 35 ug/kg/min via INTRAVENOUS

## 2018-11-13 MED ORDER — PROPOFOL 10 MG/ML IV BOLUS
INTRAVENOUS | Status: AC
  Filled 2018-11-13: qty 20

## 2018-11-13 MED ORDER — TORSEMIDE 20 MG PO TABS
40.0000 mg | ORAL_TABLET | Freq: Every day | ORAL | Status: DC
  Administered 2018-11-13 – 2018-11-28 (×14): 40 mg via ORAL
  Filled 2018-11-13 (×15): qty 2

## 2018-11-13 MED ORDER — FENTANYL CITRATE (PF) 100 MCG/2ML IJ SOLN: 25.0000 ug | INTRAMUSCULAR | Status: DC | PRN

## 2018-11-13 MED ORDER — ONDANSETRON HCL 4 MG/2ML IJ SOLN: 4.0000 mg | Freq: Once | INTRAMUSCULAR | Status: DC | PRN

## 2018-11-13 MED ORDER — PHENYLEPHRINE HCL (PRESSORS) 10 MG/ML IV SOLN
INTRAVENOUS | Status: DC | PRN
  Administered 2018-11-13: 100 ug via INTRAVENOUS

## 2018-11-13 SURGICAL SUPPLY — 39 items
APPLICATOR COTTON TIP 3IN (MISCELLANEOUS) ×6 IMPLANT
BANDAGE ELASTIC 4 LF NS (GAUZE/BANDAGES/DRESSINGS) IMPLANT
BANDAGE ELASTIC 6 LF NS (GAUZE/BANDAGES/DRESSINGS) IMPLANT
BASIN GRAD PLASTIC 32OZ STRL (MISCELLANEOUS) ×2 IMPLANT
BNDG GAUZE 4.5X4.1 6PLY STRL (MISCELLANEOUS) ×8 IMPLANT
CANISTER SUCT 1200ML W/VALVE (MISCELLANEOUS) ×3 IMPLANT
CHLORAPREP W/TINT 26 (MISCELLANEOUS) ×3 IMPLANT
CLOSURE WOUND 1/2 X4 (GAUZE/BANDAGES/DRESSINGS)
COVER WAND RF STERILE (DRAPES) ×3 IMPLANT
DRAPE LAPAROTOMY 100X77 ABD (DRAPES) ×3 IMPLANT
DRAPE SHEET LG 3/4 BI-LAMINATE (DRAPES) ×3 IMPLANT
DRSG MEPITEL 4X7.2 (GAUZE/BANDAGES/DRESSINGS) ×6 IMPLANT
DRSG TEGADERM 4X4.75 (GAUZE/BANDAGES/DRESSINGS) IMPLANT
DRSG TELFA 4X3 1S NADH ST (GAUZE/BANDAGES/DRESSINGS) IMPLANT
ELECT REM PT RETURN 9FT ADLT (ELECTROSURGICAL) ×3
ELECTRODE REM PT RTRN 9FT ADLT (ELECTROSURGICAL) ×1 IMPLANT
GAUZE SPONGE 4X4 12PLY STRL (GAUZE/BANDAGES/DRESSINGS) IMPLANT
GLOVE BIO SURGEON STRL SZ7.5 (GLOVE) ×3 IMPLANT
GLOVE INDICATOR 8.0 STRL GRN (GLOVE) ×3 IMPLANT
GOWN STRL REUS W/ TWL LRG LVL3 (GOWN DISPOSABLE) ×2 IMPLANT
GOWN STRL REUS W/TWL LRG LVL3 (GOWN DISPOSABLE) ×4
GRAFT AMNIOX 8.0X3.0 (Orthopedic Graft) ×8 IMPLANT
KIT TURNOVER KIT A (KITS) ×3 IMPLANT
NS IRRIG 500ML POUR BTL (IV SOLUTION) ×3 IMPLANT
PACK BASIN MINOR ARMC (MISCELLANEOUS) ×3 IMPLANT
PAD ABD DERMACEA PRESS 5X9 (GAUZE/BANDAGES/DRESSINGS) ×6 IMPLANT
PAD PREP 24X41 OB/GYN DISP (PERSONAL CARE ITEMS) ×3 IMPLANT
STOCKINETTE STRL 6IN 960660 (GAUZE/BANDAGES/DRESSINGS) IMPLANT
STRIP CLOSURE SKIN 1/2X4 (GAUZE/BANDAGES/DRESSINGS) IMPLANT
SUT ETHILON 4-0 (SUTURE) ×2
SUT ETHILON 4-0 FS2 18XMFL BLK (SUTURE) ×1
SUT VIC AB 2-0 CT1 (SUTURE) ×3 IMPLANT
SUT VIC AB 3-0 SH 27 (SUTURE) ×2
SUT VIC AB 3-0 SH 27X BRD (SUTURE) ×1 IMPLANT
SUT VIC AB 4-0 FS2 27 (SUTURE) ×3 IMPLANT
SUT VICRYL+ 3-0 144IN (SUTURE) ×3 IMPLANT
SUTURE ETHLN 4-0 FS2 18XMF BLK (SUTURE) ×1 IMPLANT
SWAB DUAL CULTURE TRANS RED ST (MISCELLANEOUS) IMPLANT
SWABSTK COMLB BENZOIN TINCTURE (MISCELLANEOUS) IMPLANT

## 2018-11-13 NOTE — Progress Notes (Signed)
[place] at [place] NAME: [name]    MR#:  [id number]  DATE OF BIRTH:  01-26-1935  SUBJECTIVE:  CHIEF COMPLAINT:   Chief Complaint  Patient presents with  . Leg Swelling   -Patient with left calf hematoma following a trauma while on Coumadin underwent debridement of hematoma on 11/11/2018. -Breathing is much improved today.  Patient going for dressing change in the OR and address any superficial bleeding today. -Daughter at bedside.  REVIEW OF SYSTEMS:  Review of Systems  Constitutional: Positive for malaise/fatigue. Negative for chills and fever.  HENT: Negative for congestion, ear discharge, hearing loss and nosebleeds.   Eyes: Negative for blurred vision and double vision.  Respiratory: Positive for cough. Negative for shortness of breath and wheezing.   Cardiovascular: Positive for leg swelling. Negative for chest pain and palpitations.  Gastrointestinal: Negative for abdominal pain, constipation, diarrhea, nausea and vomiting.  Genitourinary: Negative for dysuria.  Musculoskeletal: Negative for myalgias.  Neurological: Negative for dizziness, focal weakness, seizures, weakness and headaches.  Psychiatric/Behavioral: Negative for depression.    DRUG ALLERGIES:   Allergies  Allergen Reactions  . Ace Inhibitors     Cough  . Doxycycline Nausea Only  . Hydrocodone Itching and Nausea Only  . Mercury     Other reaction(s): Unknown  . Silver Dermatitis    Severe itching  . Cephalexin Rash  . Clindamycin/Lincomycin Rash  . Nickel Rash  . Penicillins Rash    Has patient had a PCN reaction causing immediate rash, facial/tongue/throat swelling, SOB or lightheadedness with hypotension: YES Has patient had a PCN reaction causing severe rash involving mucus membranes or skin necrosis: NO Has patient had a PCN reaction that required hospitalization NO Has patient had a PCN reaction occurring within the last 10 years: NO If  all of the above answers are "NO", then may proceed with Cephalosporin use.    VITALS:  Blood pressure (!) 103/55, pulse 71, temperature 98.5 F (36.9 C), temperature source Oral, resp. rate 18, height 4\' 11"  (1.499 m), weight 63.1 kg, SpO2 100 %.  PHYSICAL EXAMINATION:  Physical Exam   GENERAL:  84 y.o.-year-old elderly patient lying in the bed with no acute distress.  EYES: Pupils equal, round, reactive to light and accommodation. No scleral icterus. Extraocular muscles intact.  HEENT: Head atraumatic, normocephalic. Oropharynx and nasopharynx clear.  NECK:  Supple, no jugular venous distention. No thyroid enlargement, no tenderness.  LUNGS: Normal breath sounds bilaterally, no wheezing, rhonchi or crepitation.  Decreased bibasilar breath sounds. no use of accessory muscles of respiration.  CARDIOVASCULAR: S1, S2 normal. No  rubs, or gallops.  3/6 systolic murmur is in place ABDOMEN: Soft, nontender, nondistended. Bowel sounds present. No organomegaly or mass.  EXTREMITIES: No pedal edema, cyanosis, or clubbing.  Left leg in Ace wrap dressing.  Reinforce dressing and posteriorly with bloody discharge. NEUROLOGIC: Cranial nerves II through XII are intact. Muscle strength 5/5 in all extremities. Sensation intact. Gait not checked.  Global weakness noted. PSYCHIATRIC: The patient is alert and oriented x 3.  SKIN: No obvious rash, lesion, or ulcer.     LABORATORY PANEL:   CBC Recent Labs  Lab 11/13/18 0536  WBC 9.6  HGB 8.1*  HCT 27.1*  PLT 126*   ------------------------------------------------------------------------------------------------------------------  Chemistries  Recent Labs  Lab 11/09/18 0148  11/13/18 0536  NA 134*   < > 133*  K 3.8   < > 5.1  CL 100   < >  97*  CO2 27   < > 28  GLUCOSE 129*   < > 102*  BUN 101*   < > 75*  CREATININE 2.38*   < > 1.95*  CALCIUM 8.5*   < > 8.5*  AST 18  --   --   ALT 18  --   --   ALKPHOS 61  --   --   BILITOT 0.7  --   --     < > = values in this interval not displayed.   ------------------------------------------------------------------------------------------------------------------  Cardiac Enzymes No results for input(s): TROPONINI in the last 168 hours. ------------------------------------------------------------------------------------------------------------------  RADIOLOGY:  Dg Chest 2 View  Result Date: 11/13/2018 CLINICAL DATA:  84 year old female with shortness of breath. Traumatic bleeding recently, on Coumadin. EXAM: CHEST - 2 VIEW COMPARISON:  11/04/2018 and earlier. FINDINGS: Seated AP and lateral views of the chest. Stable cardiomegaly and mediastinal contours. Previous cardiac valve replacement. Stable left chest pacemaker. Regressed bilateral pulmonary interstitial opacity. Mild residual patchy opacity now about the left hilum and in the right upper lung. On the lateral view there are small bilateral pleural effusions with some fluid tracking into the minor fissure. Osteopenia with progressed thoracic compression fractures since 2017. Two levels previously augmented. Negative visible bowel gas pattern. IMPRESSION: 1. Regressed pulmonary edema since 11/04/2018. Small bilateral pleural effusions. 2. Patchy nonspecific perihilar opacity might reflect residual edema. Acute infection difficult to exclude. Electronically Signed   By: Genevie Ann M.D.   On: 11/13/2018 10:40    EKG:   Orders placed or performed during the hospital encounter of 11/04/18  . EKG 12-Lead  . EKG 12-Lead    ASSESSMENT AND PLAN:   84 year old female with past medical history significant for mitral valve replacement with a mechanical valve on chronic Coumadin, atrial fibrillation/flutter status post pacemaker, hypertension, chronic systolic heart failure, lymphedema, GERD and hypertension, pulmonary hypertension on chronic 2 L home oxygen brought from home secondary to left lower extremity hematoma.  1.  Left lower extremity  hematoma-after a trauma while on Coumadin. -Appreciate surgical consult.  Patient had debridement of the left calf hematoma and a biological graft placed in on 11/11/2018. -Patient had some bleeding through the dressing noted yesterday-surgical team has been notified.  Patient to go to the OR for dressing change and addressing any superficial bleeding.  No further increase in bleeding noted since late yesterday.  2.  Acute blood loss anemia-secondary to bleeding into her hematoma -Received 2 units packed RBC transfusion this admission.  Hemoglobin at 8.1 with superficial bleeding again.  -Continue to monitor hemoglobin closely.  Patient might need transfusion again given her cardiac condition.  Check in a.m.   3.  Acute on chronic systolic heart failure exacerbation-complains of some dyspnea and cough yesterday.  Remains on chronic 2 L oxygen. -Appreciate cardiology input.  Patient has a pacemaker, atrial flutter/fibrillation status post DC cardioversion last week. -Started on IV Lasix -with much improvement in her symptoms today.  Likely change to oral diuretics tomorrow..  Continue to monitor creatinine. -Net negative of 1 L in the last 24 hours. -Last echo with EF of 25%  4.  History of mechanical mitral valve surgery-Coumadin has been restarted.  On heparin drip-and for the heparin levels due to ongoing bleeding. -Cardiology recommended goal INR around 2.5 given her increased risk of bleeding  5.  CKD stage IV-Baseline creatinine seems to be around 2.3.  Continue to monitor while on IV Lasix.  Creatinine of 1.9 today.  However potassium  is elevated.  Patient has been on potassium supplements-chest discontinued today  Daughter [name] has been updated at bedside Discussed plan of care with  Dr. Bary Castilla  Patient will need physical therapy after reinforcement of her dressing today.  At baseline she ambulates with a walker and lives at home with her daughter.   All the records are reviewed and  case discussed with Care Management/Social Workerr. Management plans discussed with the patient, family and they are in agreement.  CODE STATUS: Full code  TOTAL TIME TAKING CARE OF THIS PATIENT: 43 minutes.   POSSIBLE D/C IN 3 DAYS, DEPENDING ON CLINICAL CONDITION.   Gladstone Lighter M.D on 11/13/2018 at 12:24 PM  Between 7am to 6pm - Pager - [phone]  After 6pm go to [email or web address] - password [email or web address] [facility]  Office  [phone]  CC: Primary care physician; Rusty Aus, MD

## 2018-11-13 NOTE — Progress Notes (Signed)
ANTICOAGULATION CONSULT NOTE - Initial Consult  Pharmacy Consult for Heparin and Warfarin  Indication: atrial fibrillation  Allergies  Allergen Reactions  . Ace Inhibitors     Cough  . Doxycycline Nausea Only  . Hydrocodone Itching and Nausea Only  . Mercury     Other reaction(s): Unknown  . Silver Dermatitis    Severe itching  . Cephalexin Rash  . Clindamycin/Lincomycin Rash  . Nickel Rash  . Penicillins Rash    Has patient had a PCN reaction causing immediate rash, facial/tongue/throat swelling, SOB or lightheadedness with hypotension: YES Has patient had a PCN reaction causing severe rash involving mucus membranes or skin necrosis: NO Has patient had a PCN reaction that required hospitalization NO Has patient had a PCN reaction occurring within the last 10 years: NO If all of the above answers are "NO", then may proceed with Cephalosporin use.    Patient Measurements: Height: 4\' 11"  (149.9 cm) Weight: 139 lb 1.6 oz (63.1 kg) IBW/kg (Calculated) : 43.2 Heparin Dosing Weight:   Vital Signs: Temp: 97.9 F (36.6 C) (05/15 0319) Temp Source: Oral (05/15 0319) BP: 102/58 (05/15 0319) Pulse Rate: 65 (05/15 0319)  Labs: Recent Labs    11/11/18 0313 11/11/18 2102 11/12/18 0500 11/12/18 1704 11/13/18 0536  HGB 9.0* 9.0* 8.6* 8.5* 8.1*  HCT 28.2* 30.3* 27.8*  --  27.1*  PLT 120*  --  130*  --  126*  LABPROT 14.7  --  14.6  --  16.2*  INR 1.2  --  1.2  --  1.3*  HEPARINUNFRC  --  0.59 0.40  --  0.42  CREATININE 1.95*  --   --  2.07* 1.95*    Estimated Creatinine Clearance: 18.3 mL/min (A) (by C-G formula based on SCr of 1.95 mg/dL (H)).   Medical History: Past Medical History:  Diagnosis Date  . Arthritis    "back, hands, knees" (04/10/2016)  . Chronic atrial fibrillation    a. On Coumadin  . Chronic back pain   . 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 EF 25% by echo in 08/2015 b. RHC in 08/2015 showed  normal filling pressures  . Chronic kidney disease   . Compression fracture    "several; all in my back" (04/10/2016)  . GERD (gastroesophageal reflux disease)   . Hypertension   . Lymphedema   . Pacemaker 05/2017  . S/P MVR (mitral valve replacement)    a. MVR 1994 b. redo MVR in 08/2014 - on Coumadin  . Shortness of breath dyspnea     Medications:  Medications Prior to Admission  Medication Sig Dispense Refill Last Dose  . acetaminophen (TYLENOL) 650 MG CR tablet Take 650 mg by mouth every 8 (eight) hours as needed for pain.   prn at prn  . ALPRAZolam (XANAX) 0.25 MG tablet Take 0.25 mg by mouth at bedtime as needed for sleep.   prn at prn  . amiodarone (PACERONE) 200 MG tablet Take 1 tablet (200 mg total) by mouth daily. 90 tablet 3 11/08/2018 at Unknown time  . aspirin EC 81 MG tablet Take 81 mg by mouth daily.   11/08/2018 at Unknown time  . carvedilol (COREG) 3.125 MG tablet Take 1 tablet (3.125 mg total) by mouth 2 (two) times daily with a meal. 60 tablet 0 11/08/2018 at Unknown time  . Cholecalciferol (VITAMIN D) 2000 units tablet Take 2,000 Units by mouth daily.   11/08/2018 at Unknown time  . KLOR-CON  10 10 MEQ tablet Take 10 mEq by mouth 2 (two) times daily.    11/08/2018 at Unknown time  . LASIX 20 MG tablet TAKE ONE TABLET BY MOUTH 3 TIMES DAILY (Patient taking differently: Take 20 mg by mouth 3 (three) times daily. ) 270 tablet 0 11/08/2018 at Unknown time  . losartan (COZAAR) 25 MG tablet TAKE 1/2 TABLET EVERY DAY (Patient taking differently: Take 12.5 mg by mouth daily. ) 30 tablet 3 11/08/2018 at Unknown time  . Menthol (ICY HOT) 5 % PTCH Apply 1 patch topically daily as needed (pain).   prn at prn  . metolazone (ZAROXOLYN) 5 MG tablet Take 5 mg by mouth daily as needed (severe fluid retention).   prn at prn  . Probiotic Product (RISA-BID PROBIOTIC PO) Take 1 capsule by mouth daily.    11/08/2018 at Unknown time  . spironolactone (ALDACTONE) 25 MG tablet Take 25 mg by mouth  daily.    11/08/2018 at Unknown time  . vitamin C (ASCORBIC ACID) 500 MG tablet Take 500 mg by mouth daily.   11/08/2018 at Unknown time  . warfarin (COUMADIN) 6 MG tablet Take 3-6 mg by mouth See admin instructions. Take 1 tablet (6mg ) by mouth every Monday, Wednesday, Friday evening and take  tablet (3mg ) by mouth every Tuesday, Thursday, Saturday and Sunday evening   11/08/2018 at Unknown time    Assessment: Pharmacy was consulted to start heparin bridge with warfarin. Pt was on warfarin previously for atrial fibrillation with mechanical mitral valve. Per coumadin clinic notes, patient's INR goal is 2.5 - 3.5. Her home evening dose of warfarin is: 6 mg MWF and 3 mg TThSaSun. On admission patient's INR was 3.5 and patient had a hematoma in her leg. Pt went for I&D on 5/13. Vitamin K was given 5/12 and INR has came down to 1.2 for procedure. Pt received vitamin K so it will take a couple of days for the INR to start trending up.  Drug interactions: Amiodarone which can increase INR.   Heparin infusion running @ 950 units/hr   Date INR Warfarin Dose  5/11 3.2 Held  5/12 2.8 Held and gave Vitamin K IV  5/13 1.2 6 mg  5/14 1.2  -----    Goal of Therapy:  INR 2-3, given recent bleed (per cardiology). Heparin level 0.3-0.7 units/ml Monitor platelets by anticoagulation protocol: Yes   Plan:  Heparin Treatment: 5/15 @ 0500 HL: 0.42. Level remains therapeutic. Will continue current infusion rate of 950 units/hr. Continue to check anti-Xa levels and CBCs daily while on heparin. Continue to monitor H&H and platelets.   Warfarin Treatment:  INR subtherapeutic-  D/c heparin gtt once INR > 2. Major DDI with amiodarone.    Pernell Dupre, PharmD, BCPS Clinical Pharmacist 11/13/2018 6:55 AM

## 2018-11-13 NOTE — Progress Notes (Signed)
Progress Note  Patient Name: [name] Date of Encounter: 11/13/2018  Primary Cardiologist: Ida Rogue, MD  Subjective   Breathing stable, though she does not like wearing her mask.  No chest pain.  Responded well to IV Lasix yesterday.  Pending dressing change in the OR for left lower extremity hematoma.  Inpatient Medications    Scheduled Meds: . amiodarone  200 mg Oral Daily  . cholecalciferol  2,000 Units Oral Daily  . furosemide  40 mg Intravenous Daily  . multivitamin with minerals  1 tablet Oral Daily  . polyethylene glycol  17 g Oral Daily  . sodium chloride flush  3 mL Intravenous Q12H  . vitamin C  500 mg Oral Daily  . warfarin  6 mg Oral ONCE-1800  . Warfarin - Pharmacist Dosing Inpatient   Does not apply q1800   Continuous Infusions: . sodium chloride    . sodium chloride    . heparin 950 Units/hr (11/12/18 1046)  . lactated ringers 50 mL/hr at 11/11/18 0729   PRN Meds: sodium chloride, acetaminophen **OR** acetaminophen, fentaNYL (SUBLIMAZE) injection, ondansetron **OR** ondansetron (ZOFRAN) IV, oxyCODONE, sodium chloride flush, traMADol, traZODone   Vital Signs    Vitals:   11/12/18 1637 11/12/18 1937 11/13/18 0319 11/13/18 0728  BP: (!) 101/49 (!) 96/57 (!) 102/58 (!) 103/55  Pulse: (!) 59 62 65 71  Resp: 18     Temp: 98.2 F (36.8 C) 97.6 F (36.4 C) 97.9 F (36.6 C) 98.5 F (36.9 C)  TempSrc: Oral Oral Oral Oral  SpO2: 93% 100% 98% 100%  Weight:   63.1 kg   Height:        Intake/Output Summary (Last 24 hours) at 11/13/2018 0959 Last data filed at 11/13/2018 0924 Gross per 24 hour  Intake -  Output 1000 ml  Net -1000 ml   Filed Weights   11/11/18 0655 11/12/18 0442 11/13/18 0319  Weight: 68 kg 67.1 kg 63.1 kg    Physical Exam   GEN: Frail, in no acute distress.  HEENT: Grossly normal.  Neck: Supple, no JVD, carotid bruits, or masses. Cardiac: RRR, no murmurs, rubs, or gallops. No clubbing, cyanosis, edema.  Left lower  leg wrapped.  Significant bruising to the left lateral thigh.  Radials/DP/PT 1+ and equal bilaterally.  Respiratory:  Respirations regular and unlabored, bibasilar crackles.   GI: Soft, nontender, nondistended, BS + x 4. MS: no deformity or atrophy. Skin: warm and dry, no rash. Neuro:  Strength and sensation are intact. Psych: AAOx3.  Normal affect.  Labs    Chemistry Recent Labs  Lab 11/09/18 0148  11/11/18 0313 11/12/18 1704 11/13/18 0536  NA 134*   < > 136 135 133*  K 3.8   < > 4.4 5.2* 5.1  CL 100   < > 99 99 97*  CO2 27   < > 28 28 28   GLUCOSE 129*   < > 116* 140* 102*  BUN 101*   < > 82* 76* 75*  CREATININE 2.38*   < > 1.95* 2.07* 1.95*  CALCIUM 8.5*   < > 8.8* 8.7* 8.5*  PROT 6.0*  --   --   --   --   ALBUMIN 3.6  --   --   --   --   AST 18  --   --   --   --   ALT 18  --   --   --   --   ALKPHOS 61  --   --   --   --  BILITOT 0.7  --   --   --   --   GFRNONAA 19*   < > 24* 22* 24*  GFRAA 21*   < > 27* 25* 27*  ANIONGAP 7   < > 9 8 8    < > = values in this interval not displayed.     Hematology Recent Labs  Lab 11/11/18 0313 11/11/18 2102 11/12/18 0500 11/12/18 1704 11/13/18 0536  WBC 6.8  --  10.4  --  9.6  RBC 3.12*  --  3.02*  --  2.84*  HGB 9.0* 9.0* 8.6* 8.5* 8.1*  HCT 28.2* 30.3* 27.8*  --  27.1*  MCV 90.4  --  92.1  --  95.4  MCH 28.8  --  28.5  --  28.5  MCHC 31.9  --  30.9  --  29.9*  RDW 17.1*  --  17.1*  --  17.1*  PLT 120*  --  130*  --  126*   Lab Results  Component Value Date   INR 1.3 (H) 11/13/2018   INR 1.2 11/12/2018   INR 1.2 11/11/2018     Radiology    No results found.  Telemetry    AV paced by bedside monitor.  Central telemetry not currently reviewable.- Personally Reviewed  Cardiac Studies   TTE 11/05/2018 1. The left ventricle has severely reduced systolic function, with an ejection fraction of 25-30%. The cavity size was mildly dilated. Left ventricular diastolic Doppler parameters are consistent with impaired  relaxation. Left ventrical global  hypokinesis without regional wall motion abnormalities. 2. The right ventricle has mildly reduced systolic function. The cavity was moderately enlarged. There is no increase in right ventricular wall thickness. Right ventricular systolic pressure is moderately elevated with an estimated pressure of 53 mmHg. 3. Right atrial size was moderately dilated. 4. Left atrial size was moderately dilated. 5. The aortic valve was not well visualized. Moderate calcification of the aortic valve. Aortic valve regurgitation was not assessed by color flow Doppler. Unable to estimate adegree of aortic valve stenosis given severely reduced systolic function. 6. Mechanical mitral valve. Mean gradient 5 mm Hg. 7. Tricuspid valve regurgitation is moderate. 8. Left pleural effusion noted, 6.5 cm 9. Rhythm is atrial tachycardia, rate 105 bpm  Patient Profile     84 y.o. female with a history of persistent atrial fibrillation status post cardioversion (most recently Nov 05, 2421), chronic systolic congestive heart failure status post CRT, mechanical mitral valve replacement on chronic Coumadin, mild aortic stenosis, Stage IV chronic kidney disease, COPD on home O2, who was admitted with spontaneous left calf hematoma and acute blood loss anemia in the setting of chronic anticoagulation.   Assessment & Plan    1.  Left lower extremity hematoma: Status post debridement in the OR on May 13 with plan for dressing change in the OR this afternoon.  She did have more bleeding on heparin and H&H has drifted down some to 8.1/27.1.  INR 1.3 this morning.  Will need to continue to follow H&H closely.  Appreciate surgery management.  2.  Persistent atrial fibrillation/atrial tachycardia: Status post cardioversion last week.  INR currently subtherapeutic and she has been bridged with heparin.  Obviously this is been complicated by anemia and left lower extremity hematoma requiring debridement.   Continue oral amiodarone.  Followed by EP at [facility].  3.  History of mechanical mitral valve replacement: As above, heparin bridging in the setting of #1.  4.  Acute on chronic systolic congestive  heart failure: EF 25 to 30% by echo earlier this month.  -1000 mL's overnight and 2.6 L since admission.  Appears euvolemic on examination and weight is down to 63.1 kg which is lower than she is ever been, assuming this is accurate.  Will transition to torsemide 40 mg daily today.  Blood pressure remains soft and thus she is not a suitable candidate for beta-blocker therapy.  No ACE/ARB/ARNI/MRA in the setting of renal failure.  5.  Stage IV chronic kidney disease: Creatinine stable with IV Lasix yesterday.  Follow.  6.  Acute blood loss anemia: Hemoglobin down slightly further to 8.1 with a hematocrit of 27.1 this morning.  We will continue to monitor closely in the setting of #1 along with anticoagulation in the setting of numbers 2 and 3.  May require transfusion if she drops further.   Signed, Murray Hodgkins, NP  11/13/2018, 9:59 AM    For questions or updates, please contact   Please consult [email or web address] for contact info under Cardiology/STEMI.

## 2018-11-13 NOTE — Progress Notes (Signed)
PT Cancellation Note  Patient Details Name: TAKEIA CIARAVINO MRN: [id number] DOB: Mar 31, 1935   Cancelled Treatment:    Reason Eval/Treat Not Completed: Patient at procedure or test/unavailable Pt was to have AM surgery that was delayed.  Pt still out of room at 1500, will hold PT this date and initiate care tomorrow. Kreg Shropshire, DPT 11/13/2018, 5:18 PM

## 2018-11-13 NOTE — Progress Notes (Signed)
Patient reports having ice cream, Ensure and crackers at 4 AM. Procedure delayed until Noon.

## 2018-11-13 NOTE — Anesthesia Postprocedure Evaluation (Signed)
Anesthesia Post Note  Patient: [name]  Procedure(s) Performed: LEFT LEG DRESSING CHANGE (Left )  Patient location during evaluation: PACU Anesthesia Type: General Level of consciousness: awake and alert Pain management: pain level controlled Vital Signs Assessment: post-procedure vital signs reviewed and stable Respiratory status: spontaneous breathing, nonlabored ventilation, respiratory function stable and patient connected to nasal cannula oxygen Cardiovascular status: blood pressure returned to baseline and stable Postop Assessment: no apparent nausea or vomiting Anesthetic complications: no     Last Vitals:  Vitals:   11/13/18 1439 11/13/18 1511  BP:  (!) 109/58  Pulse:  60  Resp:  16  Temp: 36.8 C 36.6 C  SpO2:  99%    Last Pain:  Vitals:   11/13/18 1511  TempSrc: Oral  PainSc: 0-No pain                 [name]

## 2018-11-13 NOTE — Op Note (Addendum)
Preoperative diagnosis: Traumatic wound left lower extremity.  Postoperative diagnosis: Same.  Operative procedure: Wound dressing change, application of Neox graft, 50 cm.  Operating Surgeon: Doran Stabler.  Anesthesia: Monitored anesthesia care  Estimated blood loss: 10 cc.  Clinical note: This 84 year old had suffered hemorrhage in the soft tissues of the left calf with elevation of the dermis for an area of at least 10 x 15 cm.  She was brought to the operative room 2 days ago for debridement of the elevated skin and clot.  Bleeding was controlled.  Grafting was applied to about 50 cm of the wound, all of the available graft in the hospital.  She had some bleeding yesterday after reinstitution of IV heparin therapy for her mechanical mitral valve.  She is returned to the operating today for wound dressing change.  Operative note: Patient underwent sedation and tolerated this well.  The leg was carefully prepped with Betadine solution and draped.  The previously placed Mepitel mesh was gently removed.  About 60% of the Neox graft placed 2 days ago was firmly adherent.  In one area there is a little area of slime and culture was obtained for aerobic organisms.  The area was carefully debrided with a saline soaked sponge.  A few tiny bleeding points were controlled with electrocautery. Four, 3 x 8 cm pieces of graft were fenestrated and then stapled to the lateral/posterior lateral aspect of the wound.  Mepitel mesh was reapplied followed by fluff gauze, Kerlix, ABD pads and an Ace wrap.  The patient tolerated the procedure well and was taken to recovery in stable condition.

## 2018-11-13 NOTE — Plan of Care (Signed)

## 2018-11-13 NOTE — Anesthesia Post-op Follow-up Note (Signed)
Anesthesia QCDR form completed.        

## 2018-11-13 NOTE — Progress Notes (Signed)
ANTICOAGULATION CONSULT NOTE - Follow up [place] for Heparin and Warfarin  Indication: atrial fibrillation  Allergies  Allergen Reactions  . Ace Inhibitors     Cough  . Doxycycline Nausea Only  . Hydrocodone Itching and Nausea Only  . Mercury     Other reaction(s): Unknown  . Silver Dermatitis    Severe itching  . Cephalexin Rash  . Clindamycin/Lincomycin Rash  . Nickel Rash  . Penicillins Rash    Has patient had a PCN reaction causing immediate rash, facial/tongue/throat swelling, SOB or lightheadedness with hypotension: YES Has patient had a PCN reaction causing severe rash involving mucus membranes or skin necrosis: NO Has patient had a PCN reaction that required hospitalization NO Has patient had a PCN reaction occurring within the last 10 years: NO If all of the above answers are "NO", then may proceed with Cephalosporin use.    Patient Measurements: Height: 4\' 11"  (149.9 cm) Weight: 139 lb 1.6 oz (63.1 kg) IBW/kg (Calculated) : 43.2 Heparin Dosing Weight:   Vital Signs: Temp: 98.5 F (36.9 C) (05/15 0728) Temp Source: Oral (05/15 0728) BP: 103/55 (05/15 0728) Pulse Rate: 71 (05/15 0728)  Labs: Recent Labs    11/11/18 0313 11/11/18 2102 11/12/18 0500 11/12/18 1704 11/13/18 0536  HGB 9.0* 9.0* 8.6* 8.5* 8.1*  HCT 28.2* 30.3* 27.8*  --  27.1*  PLT 120*  --  130*  --  126*  LABPROT 14.7  --  14.6  --  16.2*  INR 1.2  --  1.2  --  1.3*  HEPARINUNFRC  --  0.59 0.40  --  0.42  CREATININE 1.95*  --   --  2.07* 1.95*    Estimated Creatinine Clearance: 18.3 mL/min (A) (by C-G formula based on SCr of 1.95 mg/dL (H)).   Medical History: Past Medical History:  Diagnosis Date  . Arthritis    "back, hands, knees" (04/10/2016)  . Chronic atrial fibrillation    a. On Coumadin  . Chronic back pain   . 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 EF 25% by echo in 08/2015 b. RHC in 08/2015 showed  normal filling pressures  . Chronic kidney disease   . Compression fracture    "several; all in my back" (04/10/2016)  . GERD (gastroesophageal reflux disease)   . Hypertension   . Lymphedema   . Pacemaker 05/2017  . S/P MVR (mitral valve replacement)    a. MVR 1994 b. redo MVR in 08/2014 - on Coumadin  . Shortness of breath dyspnea      Assessment: Pharmacy was consulted to start heparin bridge with warfarin. Pt was on warfarin previously for atrial fibrillation with mechanical mitral valve. Per coumadin clinic notes, patient's INR goal is 2.5 - 3.5. Her home evening dose of warfarin is: 6 mg MWF and 3 mg TThSaSun. On admission patient's INR was 3.5 and patient had a hematoma in her leg. Pt went for I&D on 5/13. Vitamin K was given 5/12 and INR has came down to 1.2 for procedure. Pt received vitamin K so it will take a couple of days for the INR to start trending up.  Drug interactions: Amiodarone which can increase INR.   Heparin infusion running @ 950 units/hr   Date INR Warfarin Dose  5/11 3.2 Held  5/12 2.8 Held and gave Vitamin K IV  5/13 1.2 6 mg  5/14 1.2  6 mg  5/15 1.3     Goal  of Therapy:  INR 2-3, given recent bleed (per cardiology). Heparin level 0.3-0.7 units/ml Monitor platelets by anticoagulation protocol: Yes   Plan:  Warfarin:  INR subtherapeutic- will give warfarin 6 mg (home dose) today, likely have not seen full effect of previous doses.  D/c heparin gtt once INR > 2. Major DDI with amiodarone. Daily INR and daily CBC while on heparin drip.    Rocky Morel, PharmD, BCPS Clinical Pharmacist 11/13/2018 9:17 AM

## 2018-11-13 NOTE — Transfer of Care (Signed)
Immediate Anesthesia Transfer of Care Note  Patient: ZEANNA SUNDE  Procedure(s) Performed: LEFT LEG DRESSING CHANGE (Left )  Patient Location: PACU  Anesthesia Type:General  Level of Consciousness: sedated  Airway & Oxygen Therapy: Patient Spontanous Breathing and Patient connected to face mask oxygen  Post-op Assessment: Report given to RN and Post -op Vital signs reviewed and stable  Post vital signs: Reviewed and stable  Last Vitals:  Vitals Value Taken Time  BP    Temp    Pulse 62 11/13/2018  1:53 PM  Resp    SpO2 96 % 11/13/2018  1:53 PM  Vitals shown include unvalidated device data.  Last Pain:  Vitals:   11/13/18 0728  TempSrc: Oral  PainSc: 0-No pain      Patients Stated Pain Goal: 3 (44/45/84 8350)  Complications: No apparent anesthesia complications

## 2018-11-13 NOTE — Anesthesia Postprocedure Evaluation (Signed)
Anesthesia Post Note  Patient: [name]  Procedure(s) Performed: IRRIGATION AND DEBRIDEMENT LEFT LEG HEMATOMA (Left Leg Lower)  Patient location during evaluation: PACU Anesthesia Type: MAC Level of consciousness: awake and alert Pain management: pain level controlled Vital Signs Assessment: post-procedure vital signs reviewed and stable Respiratory status: spontaneous breathing, nonlabored ventilation, respiratory function stable and patient connected to nasal cannula oxygen Cardiovascular status: blood pressure returned to baseline and stable Postop Assessment: no apparent nausea or vomiting Anesthetic complications: no     Last Vitals:  Vitals:   11/13/18 1439 11/13/18 1511  BP:  (!) 109/58  Pulse:  60  Resp:  16  Temp: 36.8 C 36.6 C  SpO2:  99%    Last Pain:  Vitals:   11/13/18 1511  TempSrc: Oral  PainSc: 0-No pain                 Molli Barrows

## 2018-11-13 NOTE — Anesthesia Preprocedure Evaluation (Addendum)
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  Hx AFib) Atrial Fibrillation + pacemaker + Valvular Problems/Murmurs (s/p MVR)   Echo 11/05/18: 1. EF severely reduced 25-30% and mildly dilated LV. Diastolic dysfunction (not graded). Left ventrical global hypokinesis   2. RV mildly reduced systolic function. The cavity was moderately enlarged. There is no increase in right ventricular wall thickness. RV systolic pressure is moderately elevated with an estimated pressure of 53 mmHg.  3. Right atrial size was moderately dilated.  4. Left atrial size was moderately dilated.  5. The aortic valve was not well visualized. Moderate calcification of the aortic valve. Aortic valve regurgitation was not assessed by color flow Doppler. Unable to estimate degree of aortic valve stenosis given severely reduced systolic function.  6. Mechanical mitral valve. Mean gradient 5 mm Hg.  7. Tricuspid valve regurgitation is moderate.  8. Left pleural effusion noted, 6.5 cm  9. Rhythm is atrial tachycardia, rate 105 bpm    Neuro/Psych negative neurological ROS  negative psych ROS   GI/Hepatic Neg liver ROS, GERD  Controlled,  Endo/Other  negative endocrine ROS  Renal/GU CRFRenal disease  negative genitourinary   Musculoskeletal  (+) Arthritis ,   Abdominal   Peds  Hematology negative hematology ROS (+)   Anesthesia Other Findings Past Medical History: No date: Arthritis     Comment:  "back, hands, knees" (04/10/2016) No date: Chronic atrial fibrillation     Comment:  a. On Coumadin No  date: Chronic back pain No date: Chronic combined systolic (congestive) and diastolic  (congestive) heart failure (HCC)     Comment:  a. EF 25% by echo in 08/2015 b. RHC in 08/2015 showed               normal filling pressures No date: Chronic kidney disease No date: Compression fracture     Comment:  "several; all in my back" (04/10/2016) No date: GERD (gastroesophageal reflux disease) No date: Hypertension No date: Lymphedema 05/2017: Pacemaker No date: S/P MVR (mitral valve replacement)     Comment:  a. MVR 1994 b. redo MVR in 08/2014 - on Coumadin No date: Shortness of breath dyspnea  Past Surgical History: 2005?: BREAST LUMPECTOMY; Right     Comment:  benign lump excision No date: [place], 2016: CARDIAC VALVE REPLACEMENT     Comment:  "MVR; MVR" 11/06/2018: CARDIOVERSION; N/A     Comment:  Procedure: CARDIOVERSION;  Surgeon: Minna Merritts,               MD;  Location: [facility];  Service: Cardiovascular;                Laterality: N/A; 02/20/2015: CATARACT EXTRACTION W/PHACO; Right     Comment:  Procedure: CATARACT EXTRACTION PHACO AND INTRAOCULAR               LENS PLACEMENT ([place]);  Surgeon: Estill Cotta, MD;                Location: [facility];  Service: Ophthalmology;  Laterality:  Right;  US01:31.2 AP 25.3% [id number] Fluid lot #               [id number] H 10/09/2015: ELECTROPHYSIOLOGIC STUDY; N/A     Comment:  Procedure: CARDIOVERSION;  Surgeon: Wellington Hampshire,               MD;  Location: [facility];  Service: Cardiovascular;                Laterality: N/A; 04/01/2016: IR GENERIC HISTORICAL     Comment:  IR RADIOLOGIST EVAL & MGMT 04/01/2016 [facility] 04/15/2016: IR GENERIC HISTORICAL     Comment:  IR VERTEBROPLASTY CERV/THOR BX INC UNI/BIL               INC/INJECT/IMAGING 04/15/2016 Luanne Bras, MD               [facility] 04/15/2016: IR GENERIC HISTORICAL     Comment:  IR VERTEBROPLASTY EA ADDL (T&LS) BX INC UNI/BIL  INC               INJECT/IMAGING 04/15/2016 Luanne Bras, MD [facility]              RAD 04/15/2016: IR GENERIC HISTORICAL     Comment:  IR VERTEBROPLASTY EA ADDL (T&LS) BX INC UNI/BIL INC               INJECT/IMAGING 04/15/2016 Luanne Bras, MD [facility]              RAD 05/20/2016: IR GENERIC HISTORICAL     Comment:  IR RADIOLOGIST EVAL & MGMT 05/20/2016 [facility] 06/13/2016: IR GENERIC HISTORICAL     Comment:  IR VERTEBROPLASTY EA ADDL (T&LS) BX INC UNI/BIL INC               INJECT/IMAGING 06/13/2016 Luanne Bras, MD [facility]              RAD 06/13/2016: IR GENERIC HISTORICAL     Comment:  IR SACROPLASTY BILATERAL 06/13/2016 Luanne Bras,               MD [facility] 06/13/2016: IR GENERIC HISTORICAL     Comment:  IR VERTEBROPLASTY LUMBAR BX INC UNI/BIL               INC/INJECT/IMAGING 06/13/2016 Luanne Bras, MD               [facility] 07/05/2015: IRRIGATION AND DEBRIDEMENT HEMATOMA; Left     Comment:  Procedure: IRRIGATION AND DEBRIDEMENT HEMATOMA;                Surgeon: Robert Bellow, MD;  Location: [facility];                Service: General;  Laterality: Left; 11/11/2018: IRRIGATION AND DEBRIDEMENT HEMATOMA; Left     Comment:  Procedure: IRRIGATION AND DEBRIDEMENT LEFT LEG HEMATOMA;              Surgeon: Robert Bellow, MD;  Location: [facility];                Service: General;  Laterality: Left; 07/1937: pyloric stenosis No date: US ECHOCARDIOGRAPHY  BMI    Body Mass Index:  28.09 kg/m      Reproductive/Obstetrics negative OB ROS                           Anesthesia Physical Anesthesia Plan  ASA: III  Anesthesia Plan: General   Post-op  Pain Management:    Induction:   PONV Risk Score and Plan: TIVA  Airway Management Planned: Simple Face Mask and Natural Airway  Additional Equipment:   Intra-op Plan:   Post-operative Plan:   Informed Consent: I have reviewed the patients History and  Physical, chart, labs and discussed the procedure including the risks, benefits and alternatives for the proposed anesthesia with the patient or authorized representative who has indicated his/her understanding and acceptance.     Dental Advisory Given  Plan Discussed with: Anesthesiologist and CRNA  Anesthesia Plan Comments: (Dexmedetomidine infusion, supplement with low dose propofol infusion as needed)       Anesthesia Quick Evaluation

## 2018-11-14 DIAGNOSIS — Z7901 Long term (current) use of anticoagulants: Secondary | ICD-10-CM

## 2018-11-14 LAB — CBC
HCT: 27 % — ABNORMAL LOW (ref 36.0–46.0)
Hemoglobin: 8.2 g/dL — ABNORMAL LOW (ref 12.0–15.0)
MCH: 28.7 pg (ref 26.0–34.0)
MCHC: 30.4 g/dL (ref 30.0–36.0)
MCV: 94.4 fL (ref 80.0–100.0)
Platelets: 134 10*3/uL — ABNORMAL LOW (ref 150–400)
RBC: 2.86 MIL/uL — ABNORMAL LOW (ref 3.87–5.11)
RDW: 17 % — ABNORMAL HIGH (ref 11.5–15.5)
WBC: 11.4 10*3/uL — ABNORMAL HIGH (ref 4.0–10.5)
nRBC: 0 % (ref 0.0–0.2)

## 2018-11-14 LAB — BASIC METABOLIC PANEL
Anion gap: 10 (ref 5–15)
BUN: 72 mg/dL — ABNORMAL HIGH (ref 8–23)
CO2: 27 mmol/L (ref 22–32)
Calcium: 8.4 mg/dL — ABNORMAL LOW (ref 8.9–10.3)
Chloride: 97 mmol/L — ABNORMAL LOW (ref 98–111)
Creatinine, Ser: 1.93 mg/dL — ABNORMAL HIGH (ref 0.44–1.00)
GFR calc Af Amer: 28 mL/min — ABNORMAL LOW (ref >60)
GFR calc non Af Amer: 24 mL/min — ABNORMAL LOW (ref >60)
Glucose, Bld: 105 mg/dL — ABNORMAL HIGH (ref 70–99)
Potassium: 4.9 mmol/L (ref 3.5–5.1)
Sodium: 134 mmol/L — ABNORMAL LOW (ref 135–145)

## 2018-11-14 LAB — HEPARIN LEVEL (UNFRACTIONATED): Heparin Unfractionated: 0.3 IU/mL (ref 0.30–0.70)

## 2018-11-14 LAB — PROTIME-INR
INR: 1.6 — ABNORMAL HIGH (ref 0.8–1.2)
Prothrombin Time: 19 seconds — ABNORMAL HIGH (ref 11.4–15.2)

## 2018-11-14 MED ORDER — WARFARIN SODIUM 6 MG PO TABS
6.0000 mg | ORAL_TABLET | Freq: Once | ORAL | Status: AC
  Administered 2018-11-14: 6 mg via ORAL
  Filled 2018-11-14: qty 1

## 2018-11-14 NOTE — Progress Notes (Signed)
ANTICOAGULATION CONSULT NOTE - Initial Consult  Pharmacy Consult for Heparin and Warfarin  Indication: atrial fibrillation  Allergies  Allergen Reactions  . Ace Inhibitors     Cough  . Doxycycline Nausea Only  . Hydrocodone Itching and Nausea Only  . Mercury     Other reaction(s): Unknown  . Silver Dermatitis    Severe itching  . Cephalexin Rash  . Clindamycin/Lincomycin Rash  . Nickel Rash  . Penicillins Rash    Has patient had a PCN reaction causing immediate rash, facial/tongue/throat swelling, SOB or lightheadedness with hypotension: YES Has patient had a PCN reaction causing severe rash involving mucus membranes or skin necrosis: NO Has patient had a PCN reaction that required hospitalization NO Has patient had a PCN reaction occurring within the last 10 years: NO If all of the above answers are "NO", then may proceed with Cephalosporin use.    Patient Measurements: Height: 4\' 11"  (149.9 cm) Weight: 138 lb 11.8 oz (62.9 kg) IBW/kg (Calculated) : 43.2 Heparin Dosing Weight:   Vital Signs: Temp: 98 F (36.7 C) (05/16 0402) Temp Source: Oral (05/15 1935) BP: 102/55 (05/16 0402) Pulse Rate: 62 (05/16 0402)  Labs: Recent Labs    11/12/18 0500 11/12/18 1704 11/13/18 0536 11/14/18 0511  HGB 8.6* 8.5* 8.1* 8.2*  HCT 27.8*  --  27.1* 27.0*  PLT 130*  --  126* 134*  LABPROT 14.6  --  16.2* 19.0*  INR 1.2  --  1.3* 1.6*  HEPARINUNFRC 0.40  --  0.42 0.30  CREATININE  --  2.07* 1.95* 1.93*    Estimated Creatinine Clearance: 18.4 mL/min (A) (by C-G formula based on SCr of 1.93 mg/dL (H)).   Medical History: Past Medical History:  Diagnosis Date  . Arthritis    "back, hands, knees" (04/10/2016)  . Chronic atrial fibrillation    a. On Coumadin  . Chronic back pain   . 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 EF 25% by echo in 08/2015 b. RHC in 08/2015 showed normal filling pressures  . Chronic kidney disease    . Compression fracture    "several; all in my back" (04/10/2016)  . GERD (gastroesophageal reflux disease)   . Hypertension   . Lymphedema   . Pacemaker 05/2017  . S/P MVR (mitral valve replacement)    a. MVR 1994 b. redo MVR in 08/2014 - on Coumadin  . Shortness of breath dyspnea     Medications:  Medications Prior to Admission  Medication Sig Dispense Refill Last Dose  . acetaminophen (TYLENOL) 650 MG CR tablet Take 650 mg by mouth every 8 (eight) hours as needed for pain.   prn at prn  . ALPRAZolam (XANAX) 0.25 MG tablet Take 0.25 mg by mouth at bedtime as needed for sleep.   prn at prn  . amiodarone (PACERONE) 200 MG tablet Take 1 tablet (200 mg total) by mouth daily. 90 tablet 3 11/08/2018 at Unknown time  . aspirin EC 81 MG tablet Take 81 mg by mouth daily.   11/08/2018 at Unknown time  . carvedilol (COREG) 3.125 MG tablet Take 1 tablet (3.125 mg total) by mouth 2 (two) times daily with a meal. 60 tablet 0 11/08/2018 at Unknown time  . Cholecalciferol (VITAMIN D) 2000 units tablet Take 2,000 Units by mouth daily.   11/08/2018 at Unknown time  . KLOR-CON 10 10 MEQ tablet Take 10 mEq by mouth 2 (two) times daily.    11/08/2018 at Unknown  time  . LASIX 20 MG tablet TAKE ONE TABLET BY MOUTH 3 TIMES DAILY (Patient taking differently: Take 20 mg by mouth 3 (three) times daily. ) 270 tablet 0 11/08/2018 at Unknown time  . losartan (COZAAR) 25 MG tablet TAKE 1/2 TABLET EVERY DAY (Patient taking differently: Take 12.5 mg by mouth daily. ) 30 tablet 3 11/08/2018 at Unknown time  . Menthol (ICY HOT) 5 % PTCH Apply 1 patch topically daily as needed (pain).   prn at prn  . metolazone (ZAROXOLYN) 5 MG tablet Take 5 mg by mouth daily as needed (severe fluid retention).   prn at prn  . Probiotic Product (RISA-BID PROBIOTIC PO) Take 1 capsule by mouth daily.    11/08/2018 at Unknown time  . spironolactone (ALDACTONE) 25 MG tablet Take 25 mg by mouth daily.    11/08/2018 at Unknown time  . vitamin C  (ASCORBIC ACID) 500 MG tablet Take 500 mg by mouth daily.   11/08/2018 at Unknown time  . warfarin (COUMADIN) 6 MG tablet Take 3-6 mg by mouth See admin instructions. Take 1 tablet (6mg ) by mouth every Monday, Wednesday, Friday evening and take  tablet (3mg ) by mouth every Tuesday, Thursday, Saturday and Sunday evening   11/08/2018 at Unknown time    Assessment: Pharmacy was consulted to start heparin bridge with warfarin. Pt was on warfarin previously for atrial fibrillation with mechanical mitral valve. Per coumadin clinic notes, patient's INR goal is 2.5 - 3.5. Her home evening dose of warfarin is: 6 mg MWF and 3 mg TThSaSun. On admission patient's INR was 3.5 and patient had a hematoma in her leg. Pt went for I&D on 5/13. Vitamin K was given 5/12 and INR has came down to 1.2 for procedure. Pt received vitamin K so it will take a couple of days for the INR to start trending up.  Drug interactions: Amiodarone which can increase INR.   Heparin infusion running @ 950 units/hr   Date INR Warfarin Dose  5/11 3.2 Held  5/12 2.8 Held and gave Vitamin K IV  5/13 1.2 6 mg  5/14 1.2  -----    Goal of Therapy:  INR 2-3, given recent bleed (per cardiology). Heparin level 0.3-0.7 units/ml Monitor platelets by anticoagulation protocol: Yes   Plan:  Heparin Treatment: 5/16 @ 0511 HL: 0.30. Level remains therapeutic. Will continue current infusion rate of 950 units/hr. Continue to check anti-Xa levels and CBCs daily while on heparin. Continue to monitor H&H and platelets.   Warfarin Treatment:  INR subtherapeutic-  D/c heparin gtt once INR > 2. Major DDI with amiodarone.    Pernell Dupre, PharmD, BCPS Clinical Pharmacist 11/14/2018 6:03 AM

## 2018-11-14 NOTE — Progress Notes (Signed)
Unable to complete orthostatic vitals dt patient refusing to stand.

## 2018-11-14 NOTE — Progress Notes (Addendum)
ANTICOAGULATION CONSULT NOTE - Initial Consult  Pharmacy Consult for Heparin and Warfarin  Indication: atrial fibrillation  Allergies  Allergen Reactions  . Ace Inhibitors     Cough  . Doxycycline Nausea Only  . Hydrocodone Itching and Nausea Only  . Mercury     Other reaction(s): Unknown  . Silver Dermatitis    Severe itching  . Cephalexin Rash  . Clindamycin/Lincomycin Rash  . Nickel Rash  . Penicillins Rash    Has patient had a PCN reaction causing immediate rash, facial/tongue/throat swelling, SOB or lightheadedness with hypotension: YES Has patient had a PCN reaction causing severe rash involving mucus membranes or skin necrosis: NO Has patient had a PCN reaction that required hospitalization NO Has patient had a PCN reaction occurring within the last 10 years: NO If all of the above answers are "NO", then may proceed with Cephalosporin use.    Patient Measurements: Height: 4\' 11"  (149.9 cm) Weight: 138 lb 11.8 oz (62.9 kg) IBW/kg (Calculated) : 43.2 Heparin Dosing Weight:   Vital Signs: Temp: 98.6 F (37 C) (05/16 0804) Temp Source: Oral (05/16 0804) BP: 109/57 (05/16 0804) Pulse Rate: 65 (05/16 0804)  Labs: Recent Labs    11/12/18 0500 11/12/18 1704 11/13/18 0536 11/14/18 0511  HGB 8.6* 8.5* 8.1* 8.2*  HCT 27.8*  --  27.1* 27.0*  PLT 130*  --  126* 134*  LABPROT 14.6  --  16.2* 19.0*  INR 1.2  --  1.3* 1.6*  HEPARINUNFRC 0.40  --  0.42 0.30  CREATININE  --  2.07* 1.95* 1.93*    Estimated Creatinine Clearance: 18.4 mL/min (A) (by C-G formula based on SCr of 1.93 mg/dL (H)).   Medical History: Past Medical History:  Diagnosis Date  . Arthritis    "back, hands, knees" (04/10/2016)  . Chronic atrial fibrillation    a. On Coumadin  . Chronic back pain   . 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 EF 25% by echo in 08/2015 b. RHC in 08/2015 showed normal filling pressures  . Chronic kidney disease    . Compression fracture    "several; all in my back" (04/10/2016)  . GERD (gastroesophageal reflux disease)   . Hypertension   . Lymphedema   . Pacemaker 05/2017  . S/P MVR (mitral valve replacement)    a. MVR 1994 b. redo MVR in 08/2014 - on Coumadin  . Shortness of breath dyspnea     Medications:  Medications Prior to Admission  Medication Sig Dispense Refill Last Dose  . acetaminophen (TYLENOL) 650 MG CR tablet Take 650 mg by mouth every 8 (eight) hours as needed for pain.   prn at prn  . ALPRAZolam (XANAX) 0.25 MG tablet Take 0.25 mg by mouth at bedtime as needed for sleep.   prn at prn  . amiodarone (PACERONE) 200 MG tablet Take 1 tablet (200 mg total) by mouth daily. 90 tablet 3 11/08/2018 at Unknown time  . aspirin EC 81 MG tablet Take 81 mg by mouth daily.   11/08/2018 at Unknown time  . carvedilol (COREG) 3.125 MG tablet Take 1 tablet (3.125 mg total) by mouth 2 (two) times daily with a meal. 60 tablet 0 11/08/2018 at Unknown time  . Cholecalciferol (VITAMIN D) 2000 units tablet Take 2,000 Units by mouth daily.   11/08/2018 at Unknown time  . KLOR-CON 10 10 MEQ tablet Take 10 mEq by mouth 2 (two) times daily.    11/08/2018 at Unknown  time  . LASIX 20 MG tablet TAKE ONE TABLET BY MOUTH 3 TIMES DAILY (Patient taking differently: Take 20 mg by mouth 3 (three) times daily. ) 270 tablet 0 11/08/2018 at Unknown time  . losartan (COZAAR) 25 MG tablet TAKE 1/2 TABLET EVERY DAY (Patient taking differently: Take 12.5 mg by mouth daily. ) 30 tablet 3 11/08/2018 at Unknown time  . Menthol (ICY HOT) 5 % PTCH Apply 1 patch topically daily as needed (pain).   prn at prn  . metolazone (ZAROXOLYN) 5 MG tablet Take 5 mg by mouth daily as needed (severe fluid retention).   prn at prn  . Probiotic Product (RISA-BID PROBIOTIC PO) Take 1 capsule by mouth daily.    11/08/2018 at Unknown time  . spironolactone (ALDACTONE) 25 MG tablet Take 25 mg by mouth daily.    11/08/2018 at Unknown time  . vitamin C  (ASCORBIC ACID) 500 MG tablet Take 500 mg by mouth daily.   11/08/2018 at Unknown time  . warfarin (COUMADIN) 6 MG tablet Take 3-6 mg by mouth See admin instructions. Take 1 tablet (6mg ) by mouth every Monday, Wednesday, Friday evening and take  tablet (3mg ) by mouth every Tuesday, Thursday, Saturday and Sunday evening   11/08/2018 at Unknown time    Assessment: Pharmacy was consulted to start heparin bridge with warfarin. Pt was on warfarin previously for atrial fibrillation with mechanical mitral valve. Per coumadin clinic notes, patient's INR goal is 2.5 - 3.5. Her home evening dose of warfarin is: 6 mg MWF and 3 mg TThSaSun. On admission patient's INR was 3.5 and patient had a hematoma in her leg. Pt went for I&D on 5/13. Vitamin K was given 5/12 and INR has came down to 1.2 for procedure.  Drug interactions: Amiodarone which can increase INR.   Heparin infusion running @ 950 units/hr  5/14 HL 0.4  5/15 HL 0.42 5/16 HL 0.3   Date INR Warfarin Dose  5/11 3.2 Held  5/12 2.8 Held and gave Vitamin K IV  5/13 1.2 6 mg  5/14 1.2 6 mg  5/15 1.3 6 mg  5/16 1.6 6 mg     Goal of Therapy:  INR 2-3, given recent bleed (per cardiology). Heparin level 0.3-0.7 units/ml Monitor platelets by anticoagulation protocol: Yes   Plan:  Heparin Treatment: 5/16 @ 0511 HL: 0.30. Level remains therapeutic. Will continue current infusion rate of 950 units/hr. Continue to check anti-Xa levels and CBCs daily while on heparin. Continue to monitor H&H and platelets.   Warfarin Treatment:  INR subtherapeutic-  D/c heparin gtt once INR > 2. Major DDI with amiodarone. Will order a 6 mg dose tonight. Predict INR to continue to trend up. Will need to continue to give higher doses of warfarin to overcome the vitamin K given on 5/12. Once INR is greater than > 2 recommend to restart home regimen.   Oswald Hillock, PharmD, BCPS Clinical Pharmacist 11/14/2018 8:27 AM

## 2018-11-14 NOTE — Plan of Care (Signed)

## 2018-11-14 NOTE — Evaluation (Signed)
Physical Therapy Evaluation Patient Details Name: [name] MRN: [id number] DOB: 02-18-1935 Today's Date: 11/14/2018   History of Present Illness  Pt is an 84 y.o. female with recent hospital discharge 11/08/18 (CHF exacerbation with cardioversion); returned to hospital 11/09/18 with ruptured large L LE hematoma.  Pt admitted with acute blood loss anemia from large L LE hematoma with bleeding (likely secondary anticoagulation therapy for a-fib, MVR, and pacemaker).  S/p debridement of L LE hematoma 11/11/18.  11/13/18 s/p dressing change of L LE wound with application of Neox graft.  PMH includes cardioversion 11/06/18, chronic a-fib, chronic back pain, systolic and diastolic CHF, CKD, compression fx back, pacemaker, s/p MVR, pulmonary htn, chronic venous insufficiency, B LE edema, and chronic 3 L O2 at home.  Clinical Impression  Prior to hospital admission, pt was ambulatory with rollator.  Pt lives with her daughter on main level of home; ramp to enter.  Pt reports no need to contact family or have family present for session.  Order in chart for out of bed as tolerated.  Pt requesting to get up to [facility] to have bowel movement.  Secure message sent to Dr. Bary Castilla to clarify any L LE WB'ing restrictions or ROM restrictions prior to session but did not receive message back so therapist kept pt [place] L LE and focused on maintaining L knee flexion 20-30 degrees for safety during mobility.  Pt stood up to RW with 2 assist but d/t balance concerns in standing pt assisted back to sitting safely on edge of bed.  Discussed getting pt back to bed but pt adamant on getting up to [facility] with 2 assist.  Performed 2 assist stand pivot bed to [facility].  Able to maintain [place] L LE and above noted ROM precautions during session's activities.  Pt adamant on staying on [facility] until she was successful with having a bowel movement (pt refused to allow therapist to assist her back to bed).  Therapist stayed with pt on [facility] for 30  minutes but therapist eventually called NT to stay with pt d/t pt not having bowel movement and pt refusing to get off BSC.  NT attempted to encourage pt to get back to bed but pt refusing.  Nurse notified of this (who went to check on pt) and also notified nurse of above noted precautions/restrictions and assist levels for when pt eventually willing to get back to bed with staff.  At this time will recommend STR but will need to monitor pt's status for any possible updates to discharge recommendations.  PT session ended at 1350.  New PT order received today at 1414 "For bed exercises only this weekend".  Will monitor pt's status and orders and perform therapy as indicated.    Follow Up Recommendations SNF(pending further assessment)    Equipment Recommendations  Wheelchair (measurements PT);Wheelchair cushion (measurements PT);3in1 (PT);Hospital bed;Rolling walker with 5" wheels    Recommendations for Other Services       Precautions / Restrictions Precautions Precautions: Fall Precaution Comments: Elevate L leg on one pillow supporting knee to ankle--maintain flexion at knee of 20-30 degrees; minimize any ankle/knee ROM Restrictions Weight Bearing Restrictions: Yes LLE Weight Bearing: Non weight bearing      Mobility  Bed Mobility Overal bed mobility: Needs Assistance Bed Mobility: Supine to Sit     Supine to sit: +2 for physical assistance     General bed mobility comments: assist for trunk and B LE's; use of bed sheet for safety to assist with mobility  to prevent any sheering  Transfers Overall transfer level: Needs assistance Equipment used: Rolling walker (2 wheeled);None Transfers: Sit to/from Stand Sit to Stand: Min assist;Mod assist;+2 physical assistance  Stand pivot: Mod assist x2       General transfer comment: min to mod assist x2 to initiate stand up to RW; stand pivot mod assist x2 bed to [facility]; L LE kept [place] for safety  Ambulation/Gait              General Gait Details: Deferred  Stairs            Wheelchair Mobility    Modified Rankin (Stroke Patients Only)       Balance Overall balance assessment: Needs assistance Sitting-balance support: Bilateral upper extremity supported;Feet unsupported Sitting balance-Leahy Scale: Poor Sitting balance - Comments: pt requiring at least single UE support for static sitting balance     Standing balance-Leahy Scale: Poor Standing balance comment: pt requiring B UE support on RW for balance (NWB'ing L LE)                             Pertinent Vitals/Pain Pain Assessment: Faces Faces Pain Scale: Hurts a little bit Pain Location: L LE Pain Intervention(s): Limited activity within patient's tolerance;Monitored during session;Repositioned  Vitals (HR and O2 on 3 L via nasal cannula) stable and WFL throughout treatment session.    Home Living Family/patient expects to be discharged to:: Private residence Living Arrangements: Children(Daughter) Available Help at Discharge: Family;Available PRN/intermittently;Personal care attendant Type of Home: House Home Access: Ramped entrance;Stairs to enter     Home Layout: Two level;1/2 bath on main level Home Equipment: Walker - 4 wheels;Toilet riser;Grab bars - toilet;Grab bars - tub/shower;Shower seat - built in      Prior Function Level of Independence: Needs assistance   Gait / Transfers Assistance Needed: uses rollator at baseline for household mobility  ADL's / Homemaking Assistance Needed: an aide comes 5x a week, 8-12 to assist with ADLs  Comments: denies falls in the last 6 months     Hand Dominance   Dominant Hand: Right    Extremity/Trunk Assessment   Upper Extremity Assessment Upper Extremity Assessment: Generalized weakness    Lower Extremity Assessment Lower Extremity Assessment: Generalized weakness;LLE deficits/detail LLE Deficits / Details: Did not assess d/t concern for L LE precautions        Communication   Communication: No difficulties  Cognition Arousal/Alertness: Awake/alert Behavior During Therapy: WFL for tasks assessed/performed Overall Cognitive Status: Within Functional Limits for tasks assessed                                        General Comments General comments (skin integrity, edema, etc.): L LE dressing intact.  Nurse contacted prior to session and cleared pt to get up to chair.  Pt agreeable to PT session but requesting to get up to [facility] to have bowel movement.    Exercises     Assessment/Plan    PT Assessment Patient needs continued PT services  PT Problem List Decreased strength;Decreased range of motion;Decreased activity tolerance;Decreased balance;Decreased mobility;Decreased knowledge of use of DME;Decreased knowledge of precautions;Decreased skin integrity;Pain       PT Treatment Interventions DME instruction;Gait training;Functional mobility training;Therapeutic activities;Therapeutic exercise;Balance training;Patient/family education    PT Goals (Current goals can be found in the Care Plan section)  Acute  Rehab PT Goals Patient Stated Goal: to go home PT Goal Formulation: With patient Time For Goal Achievement: 11/28/18 Potential to Achieve Goals: Fair    Frequency Min 2X/week   Barriers to discharge   Question level of assist able to provide    Co-evaluation               AM-PAC PT "6 Clicks" Mobility  Outcome Measure Help needed turning from your back to your side while in a flat bed without using bedrails?: A Lot Help needed moving from lying on your back to sitting on the side of a flat bed without using bedrails?: A Lot Help needed moving to and from a bed to a chair (including a wheelchair)?: Total Help needed standing up from a chair using your arms (e.g., wheelchair or bedside chair)?: Total Help needed to walk in hospital room?: Total Help needed climbing 3-5 steps with a railing? : Total 6 Click  Score: 8    End of Session Equipment Utilized During Treatment: Gait belt;Oxygen(3 L O2 via nasal cannula) Activity Tolerance: Patient tolerated treatment well Patient left: (sitting on BSC; L LE supported to maintain 20-30 degrees L knee flexion) Nurse Communication: Mobility status;Precautions;Weight bearing status;Other (comment)(ROM restrictions) PT Visit Diagnosis: Other abnormalities of gait and mobility (R26.89);Muscle weakness (generalized) (M62.81);Difficulty in walking, not elsewhere classified (R26.2);Pain Pain - Right/Left: Left Pain - part of body: Leg    Time: 1300-1350 PT Time Calculation (min) (ACUTE ONLY): 50 min   Charges:   PT Evaluation $PT Eval Low Complexity: 1 Low         Marrell Dicaprio, PT 11/14/18, 4:28 PM [phone]

## 2018-11-14 NOTE — Progress Notes (Signed)
[facility] NAME: Kathlynn Swofford    MR#:  [id number]  DATE OF BIRTH:  03/24/1935  SUBJECTIVE:  CHIEF COMPLAINT:   Chief Complaint  Patient presents with  . Leg Swelling   No new complaint this morning.  Resting comfortably.  No bleeding reported by nursing staff. Dressing change done by her surgeon yesterday.  Next dressing change on Monday.  REVIEW OF SYSTEMS:  Review of Systems  Constitutional: Negative for chills and fever.  HENT: Negative for hearing loss and tinnitus.   Eyes: Negative for blurred vision and double vision.  Respiratory: Negative for cough and shortness of breath.   Cardiovascular: Negative for chest pain and palpitations.  Gastrointestinal: Negative for abdominal pain, heartburn, nausea and vomiting.  Genitourinary: Negative for dysuria and urgency.  Musculoskeletal: Negative for back pain and myalgias.  Skin: Negative for itching and rash.  Neurological: Negative for dizziness and headaches.  Psychiatric/Behavioral: Negative for depression and hallucinations.    DRUG ALLERGIES:   Allergies  Allergen Reactions  . Ace Inhibitors     Cough  . Doxycycline Nausea Only  . Hydrocodone Itching and Nausea Only  . Mercury     Other reaction(s): Unknown  . Silver Dermatitis    Severe itching  . Cephalexin Rash  . Clindamycin/Lincomycin Rash  . Nickel Rash  . Penicillins Rash    Has patient had a PCN reaction causing immediate rash, facial/tongue/throat swelling, SOB or lightheadedness with hypotension: YES Has patient had a PCN reaction causing severe rash involving mucus membranes or skin necrosis: NO Has patient had a PCN reaction that required hospitalization NO Has patient had a PCN reaction occurring within the last 10 years: NO If all of the above answers are "NO", then may proceed with Cephalosporin use.   VITALS:  Blood pressure (!) 109/57, pulse 65, temperature 98.6 F (37 C),  temperature source Oral, resp. rate 16, height 4\' 11"  (1.499 m), weight 62.9 kg, SpO2 98 %. PHYSICAL EXAMINATION:   GENERAL:  84 y.o.-year-old elderly patient lying in the bed with no acute distress.  EYES: Pupils equal, round, reactive to light and accommodation. No scleral icterus. Extraocular muscles intact.  HEENT: Head atraumatic, normocephalic. Oropharynx and nasopharynx clear.  NECK:  Supple, no jugular venous distention. No thyroid enlargement, no tenderness.  LUNGS: Normal breath sounds bilaterally, no wheezing, rhonchi or crepitation.  Decreased bibasilar breath sounds. no use of accessory muscles of respiration.  CARDIOVASCULAR: S1, S2 normal. No  rubs, or gallops.  ABDOMEN: Soft, nontender, nondistended. Bowel sounds present. No organomegaly or mass.  EXTREMITIES: No pedal edema, cyanosis, or clubbing.  Left leg in Ace wrap dressing.  No evidence of any bleeding clinically NEUROLOGIC: Cranial nerves II through XII are intact. Muscle strength 5/5 in all extremities. Sensation intact. Gait not checked.  Global weakness noted. PSYCHIATRIC: The patient is alert and oriented x 3.  SKIN: No obvious rash, lesion, or ulcer.   LABORATORY PANEL:  Female CBC Recent Labs  Lab 11/14/18 0511  WBC 11.4*  HGB 8.2*  HCT 27.0*  PLT 134*   ------------------------------------------------------------------------------------------------------------------ Chemistries  Recent Labs  Lab 11/09/18 0148  11/14/18 0511  NA 134*   < > 134*  K 3.8   < > 4.9  CL 100   < > 97*  CO2 27   < > 27  GLUCOSE 129*   < > 105*  BUN 101*   < > 72*  CREATININE 2.38*   < >  1.93*  CALCIUM 8.5*   < > 8.4*  AST 18  --   --   ALT 18  --   --   ALKPHOS 61  --   --   BILITOT 0.7  --   --    < > = values in this interval not displayed.   RADIOLOGY:  No results found. ASSESSMENT AND PLAN:   84 year old female with past medical history significant for mitral valve replacement with a mechanical valve on  chronic Coumadin, atrial fibrillation/flutter status post pacemaker, hypertension, chronic systolic heart failure, lymphedema, GERD and hypertension, pulmonary hypertension on chronic 2 L home oxygen brought from home secondary to left lower extremity hematoma.  1.  Left lower extremity hematoma-after a trauma while on Coumadin. -Appreciate surgical consult.  Patient had debridement of the left calf hematoma and a biological graft placed in on 11/11/2018. Patient was taken to the OR for dressing change on 11/13/2018.  Plans for repeat dressing change by surgeon on Monday.  No further bleeding reported. Physical therapy consult placed for bed exercises only this weekend as recommended by surgeon  2.  Acute blood loss anemia-secondary to bleeding into her hematoma -Received 2 units packed RBC transfusion this admission.  Hemoglobin remained stable at 8.2 today. -Continue to monitor hemoglobin closely.    Check in a.m.   3.  Acute on chronic systolic heart failure exacerbation- Clinically improved.  Denies any shortness of breath. Remains on chronic 2 L oxygen. -Appreciate cardiology input.  Patient has a pacemaker, atrial flutter/fibrillation status post DC cardioversion last week. Patient already transitioned to torsemide 40 mg p.o. daily.  No ACE inhibitor due to underlying CKD and relative hypotension.Last echo with EF of 25%  4.  History of mechanical mitral valve surgery-Coumadin has been restarted.  On heparin drip-and for the heparin levels due to ongoing bleeding. -Cardiology recommended goal INR around 2.5 given her increased risk of bleeding  5.  CKD stage IV-Baseline creatinine seems to be around 2.3. Renal function remains fairly stable at this time with creatinine of 1.93  I called daughter [name] and updated her on treatment plans as outlined above.  All questions were answered.   All the records are reviewed and case discussed with Care Management/Social Worker.  Management plans discussed with the patient, family and they are in agreement.  CODE STATUS: Full Code  TOTAL TIME TAKING CARE OF THIS PATIENT: 27 minutes.   More than 50% of the time was spent in counseling/coordination of care: YES  POSSIBLE D/C IN 3 DAYS, DEPENDING ON CLINICAL CONDITION.   Thinh Cuccaro M.D on 11/14/2018 at 2:14 PM  Between 7am to 6pm - Pager - [phone]  After 6pm go to [email or web address] - Proofreader  [facility]  Office  [phone]  CC: Primary care physician; Rusty Aus, MD  Note: This dictation was prepared with Dragon dictation along with smaller phrase technology. Any transcriptional errors that result from this process are unintentional.

## 2018-11-14 NOTE — Progress Notes (Signed)
ANTICOAGULATION CONSULT NOTE - Initial Consult  Pharmacy Consult for Heparin and Warfarin  Indication: atrial fibrillation  Allergies  Allergen Reactions  . Ace Inhibitors     Cough  . Doxycycline Nausea Only  . Hydrocodone Itching and Nausea Only  . Mercury     Other reaction(s): Unknown  . Silver Dermatitis    Severe itching  . Cephalexin Rash  . Clindamycin/Lincomycin Rash  . Nickel Rash  . Penicillins Rash    Has patient had a PCN reaction causing immediate rash, facial/tongue/throat swelling, SOB or lightheadedness with hypotension: YES Has patient had a PCN reaction causing severe rash involving mucus membranes or skin necrosis: NO Has patient had a PCN reaction that required hospitalization NO Has patient had a PCN reaction occurring within the last 10 years: NO If all of the above answers are "NO", then may proceed with Cephalosporin use.    Patient Measurements: Height: 4\' 11"  (149.9 cm) Weight: 138 lb 11.8 oz (62.9 kg) IBW/kg (Calculated) : 43.2 Heparin Dosing Weight:   Vital Signs: Temp: 98.6 F (37 C) (05/16 0804) Temp Source: Oral (05/16 0804) BP: 109/57 (05/16 0804) Pulse Rate: 65 (05/16 0804)  Labs: Recent Labs    11/12/18 0500 11/12/18 1704 11/13/18 0536 11/14/18 0511  HGB 8.6* 8.5* 8.1* 8.2*  HCT 27.8*  --  27.1* 27.0*  PLT 130*  --  126* 134*  LABPROT 14.6  --  16.2* 19.0*  INR 1.2  --  1.3* 1.6*  HEPARINUNFRC 0.40  --  0.42 0.30  CREATININE  --  2.07* 1.95* 1.93*    Estimated Creatinine Clearance: 18.4 mL/min (A) (by C-G formula based on SCr of 1.93 mg/dL (H)).   Medical History: Past Medical History:  Diagnosis Date  . Arthritis    "back, hands, knees" (04/10/2016)  . Chronic atrial fibrillation    a. On Coumadin  . Chronic back pain   . 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 EF 25% by echo in 08/2015 b. RHC in 08/2015 showed normal filling pressures  . Chronic kidney disease    . Compression fracture    "several; all in my back" (04/10/2016)  . GERD (gastroesophageal reflux disease)   . Hypertension   . Lymphedema   . Pacemaker 05/2017  . S/P MVR (mitral valve replacement)    a. MVR 1994 b. redo MVR in 08/2014 - on Coumadin  . Shortness of breath dyspnea     Medications:  Medications Prior to Admission  Medication Sig Dispense Refill Last Dose  . acetaminophen (TYLENOL) 650 MG CR tablet Take 650 mg by mouth every 8 (eight) hours as needed for pain.   prn at prn  . ALPRAZolam (XANAX) 0.25 MG tablet Take 0.25 mg by mouth at bedtime as needed for sleep.   prn at prn  . amiodarone (PACERONE) 200 MG tablet Take 1 tablet (200 mg total) by mouth daily. 90 tablet 3 11/08/2018 at Unknown time  . aspirin EC 81 MG tablet Take 81 mg by mouth daily.   11/08/2018 at Unknown time  . carvedilol (COREG) 3.125 MG tablet Take 1 tablet (3.125 mg total) by mouth 2 (two) times daily with a meal. 60 tablet 0 11/08/2018 at Unknown time  . Cholecalciferol (VITAMIN D) 2000 units tablet Take 2,000 Units by mouth daily.   11/08/2018 at Unknown time  . KLOR-CON 10 10 MEQ tablet Take 10 mEq by mouth 2 (two) times daily.    11/08/2018 at Unknown  time  . LASIX 20 MG tablet TAKE ONE TABLET BY MOUTH 3 TIMES DAILY (Patient taking differently: Take 20 mg by mouth 3 (three) times daily. ) 270 tablet 0 11/08/2018 at Unknown time  . losartan (COZAAR) 25 MG tablet TAKE 1/2 TABLET EVERY DAY (Patient taking differently: Take 12.5 mg by mouth daily. ) 30 tablet 3 11/08/2018 at Unknown time  . Menthol (ICY HOT) 5 % PTCH Apply 1 patch topically daily as needed (pain).   prn at prn  . metolazone (ZAROXOLYN) 5 MG tablet Take 5 mg by mouth daily as needed (severe fluid retention).   prn at prn  . Probiotic Product (RISA-BID PROBIOTIC PO) Take 1 capsule by mouth daily.    11/08/2018 at Unknown time  . spironolactone (ALDACTONE) 25 MG tablet Take 25 mg by mouth daily.    11/08/2018 at Unknown time  . vitamin C  (ASCORBIC ACID) 500 MG tablet Take 500 mg by mouth daily.   11/08/2018 at Unknown time  . warfarin (COUMADIN) 6 MG tablet Take 3-6 mg by mouth See admin instructions. Take 1 tablet (6mg ) by mouth every Monday, Wednesday, Friday evening and take  tablet (3mg ) by mouth every Tuesday, Thursday, Saturday and Sunday evening   11/08/2018 at Unknown time    Assessment: Pharmacy was consulted to start heparin bridge with warfarin. Pt was on warfarin previously for atrial fibrillation with mechanical mitral valve. Per coumadin clinic notes, patient's INR goal is 2.5 - 3.5. Her home evening dose of warfarin is: 6 mg MWF and 3 mg TThSaSun. On admission patient's INR was 3.5 and patient had a hematoma in her leg. Pt went for I&D on 5/13. Vitamin K was given 5/12 and INR has came down to 1.2 for procedure. Pt received vitamin K so it will take a couple of days for the INR to start trending up.  Drug interactions: Amiodarone which can increase INR.   Heparin infusion running @ 950 units/hr  5/14 HL 0.4  5/15 HL 0.42 5/16 HL 0.3   Date INR Warfarin Dose  5/11 3.2 Held  5/12 2.8 Held and gave Vitamin K IV  5/13 1.2 6 mg  5/14 1.2 6 mg  5/15 1.3 6 mg  5/16 1.6 6 mg     Goal of Therapy:  INR 2-3, given recent bleed (per cardiology). Heparin level 0.3-0.7 units/ml Monitor platelets by anticoagulation protocol: Yes   Plan:  Heparin Treatment: 5/16 @ 0511 HL: 0.30. Level remains therapeutic. Will continue current infusion rate of 950 units/hr. Continue to check anti-Xa levels and CBCs daily while on heparin. Continue to monitor H&H and platelets.   Warfarin Treatment:  INR subtherapeutic-  D/c heparin gtt once INR > 2. Major DDI with amiodarone. Will order a 6 mg dose tonight. Predict INR to continue to trend up. Will need to continue to give higher doses of warfarin to overcome the vitamin K given on 5/12. Once INR is greater than > 2 recommend to restart home regimen. Pt presented to the ED with a  slightly higher INR than goal range. Potentially can decrease weekly dose by ~10% (recommend 4 mg daily).    Oswald Hillock, PharmD, BCPS Clinical Pharmacist 11/14/2018 8:19 AM

## 2018-11-14 NOTE — Plan of Care (Signed)
  Problem: Clinical Measurements: Goal: Ability to maintain clinical measurements within normal limits will improve Outcome: Progressing Goal: Respiratory complications will improve Outcome: Progressing   Problem: Elimination: Goal: Will not experience complications related to urinary retention Outcome: Progressing   Problem: Pain Managment: Goal: General experience of comfort will improve Outcome: Progressing   Problem: Safety: Goal: Ability to remain free from injury will improve Outcome: Progressing   Problem: Skin Integrity: Goal: Risk for impaired skin integrity will decrease Outcome: Progressing   Problem: Activity: Goal: Risk for activity intolerance will decrease Outcome: Not Progressing Note:  Patient refuses to get up.

## 2018-11-14 NOTE — Progress Notes (Signed)
Progress Note  Patient Name: [name] Date of Encounter: 11/14/2018  Primary Cardiologist: Rockey Situ  Subjective   Transitioned to torsemide 5/15. Repeat CXR on 5/15 showed improved pulmonary edema with small bilateral pleural effusions. INR remains subtherapeutic at 1.6 this morning. HGB stable at 8.2. Leukocytosis this morning of 11.4. Potassium improved at 4.9. SCr stable at 1.93. She underwent dressing change in the OR on 5/15 with concern for possible infection noted. Culture with GNR this morning.   No SOB, chest pain, or palpitations.   Inpatient Medications    Scheduled Meds: . amiodarone  200 mg Oral Daily  . cholecalciferol  2,000 Units Oral Daily  . multivitamin with minerals  1 tablet Oral Daily  . polyethylene glycol  17 g Oral Daily  . sodium chloride flush  3 mL Intravenous Q12H  . torsemide  40 mg Oral Daily  . vitamin C  500 mg Oral Daily  . warfarin  6 mg Oral ONCE-1800  . Warfarin - Pharmacist Dosing Inpatient   Does not apply q1800   Continuous Infusions: . sodium chloride    . sodium chloride    . heparin 950 Units/hr (11/13/18 1530)  . lactated ringers 50 mL/hr at 11/11/18 0729   PRN Meds: sodium chloride, acetaminophen **OR** acetaminophen, fentaNYL (SUBLIMAZE) injection, ondansetron **OR** ondansetron (ZOFRAN) IV, oxyCODONE, sodium chloride flush, traMADol, traZODone   Vital Signs    Vitals:   11/13/18 1511 11/13/18 1935 11/14/18 0402 11/14/18 0804  BP: (!) 109/58 (!) 104/58 (!) 102/55 (!) 109/57  Pulse: 60 64 62 65  Resp: 16 19 19 16   Temp: 97.9 F (36.6 C) 97.9 F (36.6 C) 98 F (36.7 C) 98.6 F (37 C)  TempSrc: Oral Oral  Oral  SpO2: 99% 90% 97% 98%  Weight:   62.9 kg   Height:        Intake/Output Summary (Last 24 hours) at 11/14/2018 0923 Last data filed at 11/13/2018 2100 Gross per 24 hour  Intake 821.39 ml  Output 1160 ml  Net -338.61 ml   Filed Weights   11/12/18 0442 11/13/18 0319 11/14/18 0402  Weight: 67.1 kg  63.1 kg 62.9 kg    Telemetry    paced - Personally Reviewed  ECG    n/a - Personally Reviewed  Physical Exam   GEN: No acute distress.   Neck: No JVD. Cardiac: RRR, mechanical murmur, no rubs, or gallops.  Respiratory: Clear to auscultation bilaterally.  GI: Soft, nontender, non-distended.   MS: No edema; Left lower extremity dressed.  Neuro:  Alert and oriented x 3; Nonfocal.  Psych: Normal affect.  Labs    Chemistry Recent Labs  Lab 11/09/18 0148  11/12/18 1704 11/13/18 0536 11/14/18 0511  NA 134*   < > 135 133* 134*  K 3.8   < > 5.2* 5.1 4.9  CL 100   < > 99 97* 97*  CO2 27   < > 28 28 27   GLUCOSE 129*   < > 140* 102* 105*  BUN 101*   < > 76* 75* 72*  CREATININE 2.38*   < > 2.07* 1.95* 1.93*  CALCIUM 8.5*   < > 8.7* 8.5* 8.4*  PROT 6.0*  --   --   --   --   ALBUMIN 3.6  --   --   --   --   AST 18  --   --   --   --   ALT 18  --   --   --   --  ALKPHOS 61  --   --   --   --   BILITOT 0.7  --   --   --   --   GFRNONAA 19*   < > 22* 24* 24*  GFRAA 21*   < > 25* 27* 28*  ANIONGAP 7   < > 8 8 10    < > = values in this interval not displayed.     Hematology Recent Labs  Lab 11/12/18 0500 11/12/18 1704 11/13/18 0536 11/14/18 0511  WBC 10.4  --  9.6 11.4*  RBC 3.02*  --  2.84* 2.86*  HGB 8.6* 8.5* 8.1* 8.2*  HCT 27.8*  --  27.1* 27.0*  MCV 92.1  --  95.4 94.4  MCH 28.5  --  28.5 28.7  MCHC 30.9  --  29.9* 30.4  RDW 17.1*  --  17.1* 17.0*  PLT 130*  --  126* 134*    Cardiac EnzymesNo results for input(s): TROPONINI in the last 168 hours. No results for input(s): TROPIPOC in the last 168 hours.   BNPNo results for input(s): BNP, PROBNP in the last 168 hours.   DDimer No results for input(s): DDIMER in the last 168 hours.   Radiology    Dg Chest 2 View  Result Date: 11/13/2018 IMPRESSION: 1. Regressed pulmonary edema since 11/04/2018. Small bilateral pleural effusions. 2. Patchy nonspecific perihilar opacity might reflect residual edema. Acute  infection difficult to exclude. Electronically Signed   By: Genevie Ann M.D.   On: 11/13/2018 10:40    Cardiac Studies   2D Echo 11/05/2018: 1. The left ventricle has severely reduced systolic function, with an ejection fraction of 25-30%. The cavity size was mildly dilated. Left ventricular diastolic Doppler parameters are consistent with impaired relaxation. Left ventrical global hypokinesis without regional wall motion abnormalities.  2. The right ventricle has mildly reduced systolic function. The cavity was moderately enlarged. There is no increase in right ventricular wall thickness. Right ventricular systolic pressure is moderately elevated with an estimated pressure of 53 mmHg.  3. Right atrial size was moderately dilated.  4. Left atrial size was moderately dilated.  5. The aortic valve was not well visualized. Moderate calcification of the aortic valve. Aortic valve regurgitation was not assessed by color flow Doppler. Unable to estimate adegree of aortic valve stenosis given severely reduced systolic function.  6. Mechanical mitral valve. Mean gradient 5 mm Hg.  7. Tricuspid valve regurgitation is moderate.  8. Left pleural effusion noted, 6.5 cm  9. Rhythm is atrial tachycardia, rate 105 bpm  Patient Profile     84 y.o. female with history of persistent atrial fibrillation status post cardioversion (most recently Nov 06, 4330), chronic systolic congestive heart failure status post CRT, mechanical mitral valve replacement on chronic Coumadin, mild aortic stenosis, Stage IV chronic kidney disease, COPD on home O2, who was admitted with spontaneous left calf hematoma and acute blood loss anemia in the setting of chronic anticoagulation.   Assessment & Plan    1. Left lower extremity hematoma: -Status post debridement in the OR on 5/13 with dressing change in the OR on 5/15 -Culture from dressing change procedure showing GNR -HGB remains low, though stable  2. Persistent Afib/atrial  tachycardia: -Status post DCCV last week -Currently paced -Not on beta blocker secondary to relative hypotension -Continue PO amiodarone  -Continue heparin gtt until INR is therapeutic  -Followed by [facility]  3. Acute on chronic HFrEF: -Volume status improved -Transitioned to torsemide 40 mg daily  on 5/15 -With relative hypotension she has not been on a beta blocker -Not on an ACEi/ARB/ARNI/MRA in the setting of CKD and relative hypotension    4. History of mechanical mitral valve replacement: -Continue heparin gtt until INR is therapeutic  -High risk of thromboembolism  5. CKD stage IV: -Stable  6. Acute blood loss anemia: -Low though stable -Monitor closely in the setting of [place] with transfusion as needed  7. Leukocytosis: -Possibly in the setting of wound infection as above -ABX per IM   For questions or updates, please contact [place] Please consult [email or web address] for contact info under Cardiology/STEMI.    Signed, Christell Faith, PA-C [facility] Pager: [phone] 11/14/2018, 9:23 AM

## 2018-11-15 LAB — BASIC METABOLIC PANEL
Anion gap: 9 (ref 5–15)
BUN: 71 mg/dL — ABNORMAL HIGH (ref 8–23)
CO2: 28 mmol/L (ref 22–32)
Calcium: 8.6 mg/dL — ABNORMAL LOW (ref 8.9–10.3)
Chloride: 100 mmol/L (ref 98–111)
Creatinine, Ser: 1.81 mg/dL — ABNORMAL HIGH (ref 0.44–1.00)
GFR calc Af Amer: 30 mL/min — ABNORMAL LOW (ref >60)
GFR calc non Af Amer: 26 mL/min — ABNORMAL LOW (ref >60)
Glucose, Bld: 93 mg/dL (ref 70–99)
Potassium: 4.4 mmol/L (ref 3.5–5.1)
Sodium: 137 mmol/L (ref 135–145)

## 2018-11-15 LAB — PROTIME-INR
INR: 1.9 — ABNORMAL HIGH (ref 0.8–1.2)
Prothrombin Time: 21.7 seconds — ABNORMAL HIGH (ref 11.4–15.2)

## 2018-11-15 LAB — CBC
HCT: 26.6 % — ABNORMAL LOW (ref 36.0–46.0)
Hemoglobin: 8.2 g/dL — ABNORMAL LOW (ref 12.0–15.0)
MCH: 28.6 pg (ref 26.0–34.0)
MCHC: 30.8 g/dL (ref 30.0–36.0)
MCV: 92.7 fL (ref 80.0–100.0)
Platelets: 153 10*3/uL (ref 150–400)
RBC: 2.87 MIL/uL — ABNORMAL LOW (ref 3.87–5.11)
RDW: 16.6 % — ABNORMAL HIGH (ref 11.5–15.5)
WBC: 6.9 10*3/uL (ref 4.0–10.5)
nRBC: 0 % (ref 0.0–0.2)

## 2018-11-15 LAB — HEPARIN LEVEL (UNFRACTIONATED)
Heparin Unfractionated: 0.2 IU/mL — ABNORMAL LOW (ref 0.30–0.70)
Heparin Unfractionated: 0.32 IU/mL (ref 0.30–0.70)

## 2018-11-15 MED ORDER — HEPARIN BOLUS VIA INFUSION
850.0000 [IU] | Freq: Once | INTRAVENOUS | Status: AC
  Administered 2018-11-15: 850 [IU] via INTRAVENOUS
  Filled 2018-11-15: qty 850

## 2018-11-15 MED ORDER — LACTULOSE 10 GM/15ML PO SOLN: 30.0000 g | Freq: Every day | ORAL | Status: DC | PRN

## 2018-11-15 NOTE — Progress Notes (Signed)
[place] for Heparin and Warfarin  Indication: atrial fibrillation  Allergies  Allergen Reactions  . Ace Inhibitors     Cough  . Doxycycline Nausea Only  . Hydrocodone Itching and Nausea Only  . Mercury     Other reaction(s): Unknown  . Silver Dermatitis    Severe itching  . Cephalexin Rash  . Clindamycin/Lincomycin Rash  . Nickel Rash  . Penicillins Rash    Has patient had a PCN reaction causing immediate rash, facial/tongue/throat swelling, SOB or lightheadedness with hypotension: YES Has patient had a PCN reaction causing severe rash involving mucus membranes or skin necrosis: NO Has patient had a PCN reaction that required hospitalization NO Has patient had a PCN reaction occurring within the last 10 years: NO If all of the above answers are "NO", then may proceed with Cephalosporin use.    Patient Measurements: Height: 4\' 11"  (149.9 cm) Weight: 138 lb 0.1 oz (62.6 kg) IBW/kg (Calculated) : 43.2 Heparin Dosing Weight: 62.6 kg  Vital Signs: Temp: 99.2 F (37.3 C) (05/17 1614) Temp Source: Oral (05/17 1614) BP: 114/51 (05/17 1614) Pulse Rate: 59 (05/17 1614)  Labs: Recent Labs    11/13/18 0536 11/14/18 0511 11/15/18 0521 11/15/18 1533  HGB 8.1* 8.2* 8.2*  --   HCT 27.1* 27.0* 26.6*  --   PLT 126* 134* 153  --   LABPROT 16.2* 19.0* 21.7*  --   INR 1.3* 1.6* 1.9*  --   HEPARINUNFRC 0.42 0.30 0.20* 0.32  CREATININE 1.95* 1.93* 1.81*  --     Estimated Creatinine Clearance: 19.6 mL/min (A) (by C-G formula based on SCr of 1.81 mg/dL (H)).   Medical History: Past Medical History:  Diagnosis Date  . Arthritis    "back, hands, knees" (04/10/2016)  . Chronic atrial fibrillation    a. On Coumadin  . Chronic back pain   . 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 EF 25% by echo in 08/2015 b. RHC in 08/2015 showed normal filling pressures  . Chronic kidney disease   .  Compression fracture    "several; all in my back" (04/10/2016)  . GERD (gastroesophageal reflux disease)   . Hypertension   . Lymphedema   . Pacemaker 05/2017  . S/P MVR (mitral valve replacement)    a. MVR 1994 b. redo MVR in 08/2014 - on Coumadin  . Shortness of breath dyspnea     Medications:  Medications Prior to Admission  Medication Sig Dispense Refill Last Dose  . acetaminophen (TYLENOL) 650 MG CR tablet Take 650 mg by mouth every 8 (eight) hours as needed for pain.   prn at prn  . ALPRAZolam (XANAX) 0.25 MG tablet Take 0.25 mg by mouth at bedtime as needed for sleep.   prn at prn  . amiodarone (PACERONE) 200 MG tablet Take 1 tablet (200 mg total) by mouth daily. 90 tablet 3 11/08/2018 at Unknown time  . aspirin EC 81 MG tablet Take 81 mg by mouth daily.   11/08/2018 at Unknown time  . carvedilol (COREG) 3.125 MG tablet Take 1 tablet (3.125 mg total) by mouth 2 (two) times daily with a meal. 60 tablet 0 11/08/2018 at Unknown time  . Cholecalciferol (VITAMIN D) 2000 units tablet Take 2,000 Units by mouth daily.   11/08/2018 at Unknown time  . KLOR-CON 10 10 MEQ tablet Take 10 mEq by mouth 2 (two) times daily.    11/08/2018 at Unknown time  .  LASIX 20 MG tablet TAKE ONE TABLET BY MOUTH 3 TIMES DAILY (Patient taking differently: Take 20 mg by mouth 3 (three) times daily. ) 270 tablet 0 11/08/2018 at Unknown time  . losartan (COZAAR) 25 MG tablet TAKE 1/2 TABLET EVERY DAY (Patient taking differently: Take 12.5 mg by mouth daily. ) 30 tablet 3 11/08/2018 at Unknown time  . Menthol (ICY HOT) 5 % PTCH Apply 1 patch topically daily as needed (pain).   prn at prn  . metolazone (ZAROXOLYN) 5 MG tablet Take 5 mg by mouth daily as needed (severe fluid retention).   prn at prn  . Probiotic Product (RISA-BID PROBIOTIC PO) Take 1 capsule by mouth daily.    11/08/2018 at Unknown time  . spironolactone (ALDACTONE) 25 MG tablet Take 25 mg by mouth daily.    11/08/2018 at Unknown time  . vitamin C (ASCORBIC  ACID) 500 MG tablet Take 500 mg by mouth daily.   11/08/2018 at Unknown time  . warfarin (COUMADIN) 6 MG tablet Take 3-6 mg by mouth See admin instructions. Take 1 tablet (6mg ) by mouth every Monday, Wednesday, Friday evening and take  tablet (3mg ) by mouth every Tuesday, Thursday, Saturday and Sunday evening   11/08/2018 at Unknown time    Assessment: Pharmacy was consulted to start heparin bridge with warfarin. Pt was on warfarin previously for atrial fibrillation with mechanical mitral valve. Per coumadin clinic notes, patient's INR goal is 2.5 - 3.5. Her home evening dose of warfarin is: 6 mg MWF and 3 mg TThSaSun. On admission patient's INR was 3.5 and patient had a hematoma in her leg. Pt went for I&D on 5/13. Vitamin K was given 5/12 and INR has came down to 1.2 for procedure.  Drug interactions: Amiodarone which can increase INR.   Heparin infusion running @ 950 units/hr  5/14 HL 0.4  5/15 HL 0.42 5/16 HL 0.3  5/17 HL 0.2 bolus 850 units x 1 increase rate to 1050.  5/17 HL 0.3 @ 1533 - therapeutic x 1 Continue rate 1050/hr  Date INR Warfarin Dose  5/11 3.2 Held  5/12 2.8 Held and gave Vitamin K IV  5/13 1.2 6 mg  5/14 1.2 6 mg  5/15 1.3 6 mg  5/16 1.6 6 mg   5/17 1.9 3 mg    Goal of Therapy:  INR 2-3, given recent bleed (per cardiology). Heparin level 0.3-0.7 units/ml Monitor platelets by anticoagulation protocol: Yes   Plan:  Heparin Treatment: Heparin level is therapeutic x 1. Will conitnue infusion rate of 1050 units/hr. Continue to check anti-Xa levels in 8 hours and CBCs daily while on heparin. Continue to monitor H&H and platelets.   Warfarin Treatment:  INR slightly subtherapeutic-  D/c heparin gtt once INR > 2. Major DDI with amiodarone. Will order a 3 mg dose tonight, recommend to continue with home starting today. Predict INR will be > 2 tomorrow and we can d/c heparin tomorrow. Pt needed higher warfarin doses on Thursday and Saturday to overcome the vitamin K  given on 5/12.    Lu Duffel, PharmD, BCPS Clinical Pharmacist 11/15/2018 5:56 PM

## 2018-11-15 NOTE — Plan of Care (Signed)
  Problem: Education: Goal: Knowledge of General Education information will improve Description: Including pain rating scale, medication(s)/side effects and non-pharmacologic comfort measures Outcome: Progressing   Problem: Clinical Measurements: Goal: Ability to maintain clinical measurements within normal limits will improve Outcome: Progressing   Problem: Nutrition: Goal: Adequate nutrition will be maintained Outcome: Progressing   Problem: Safety: Goal: Ability to remain free from injury will improve Outcome: Progressing   Problem: Skin Integrity: Goal: Risk for impaired skin integrity will decrease Outcome: Progressing   

## 2018-11-15 NOTE — Progress Notes (Signed)
ANTICOAGULATION CONSULT NOTE - Initial Consult  Pharmacy Consult for Heparin and Warfarin  Indication: atrial fibrillation  Allergies  Allergen Reactions  . Ace Inhibitors     Cough  . Doxycycline Nausea Only  . Hydrocodone Itching and Nausea Only  . Mercury     Other reaction(s): Unknown  . Silver Dermatitis    Severe itching  . Cephalexin Rash  . Clindamycin/Lincomycin Rash  . Nickel Rash  . Penicillins Rash    Has patient had a PCN reaction causing immediate rash, facial/tongue/throat swelling, SOB or lightheadedness with hypotension: YES Has patient had a PCN reaction causing severe rash involving mucus membranes or skin necrosis: NO Has patient had a PCN reaction that required hospitalization NO Has patient had a PCN reaction occurring within the last 10 years: NO If all of the above answers are "NO", then may proceed with Cephalosporin use.    Patient Measurements: Height: 4\' 11"  (149.9 cm) Weight: 138 lb 0.1 oz (62.6 kg) IBW/kg (Calculated) : 43.2 Heparin Dosing Weight:   Vital Signs: Temp: 98.5 F (36.9 C) (05/17 0425) Temp Source: Oral (05/16 1925) BP: 105/52 (05/17 0425) Pulse Rate: 60 (05/17 0425)  Labs: Recent Labs    11/12/18 1704 11/13/18 0536 11/14/18 0511 11/15/18 0521  HGB 8.5* 8.1* 8.2* 8.2*  HCT  --  27.1* 27.0* 26.6*  PLT  --  126* 134* 153  LABPROT  --  16.2* 19.0* 21.7*  INR  --  1.3* 1.6* 1.9*  HEPARINUNFRC  --  0.42 0.30 0.20*  CREATININE 2.07* 1.95* 1.93*  --     Estimated Creatinine Clearance: 18.4 mL/min (A) (by C-G formula based on SCr of 1.93 mg/dL (H)).   Medical History: Past Medical History:  Diagnosis Date  . Arthritis    "back, hands, knees" (04/10/2016)  . Chronic atrial fibrillation    a. On Coumadin  . Chronic back pain   . 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 EF 25% by echo in 08/2015 b. RHC in 08/2015 showed normal filling pressures  . Chronic kidney  disease   . Compression fracture    "several; all in my back" (04/10/2016)  . GERD (gastroesophageal reflux disease)   . Hypertension   . Lymphedema   . Pacemaker 05/2017  . S/P MVR (mitral valve replacement)    a. MVR 1994 b. redo MVR in 08/2014 - on Coumadin  . Shortness of breath dyspnea     Medications:  Medications Prior to Admission  Medication Sig Dispense Refill Last Dose  . acetaminophen (TYLENOL) 650 MG CR tablet Take 650 mg by mouth every 8 (eight) hours as needed for pain.   prn at prn  . ALPRAZolam (XANAX) 0.25 MG tablet Take 0.25 mg by mouth at bedtime as needed for sleep.   prn at prn  . amiodarone (PACERONE) 200 MG tablet Take 1 tablet (200 mg total) by mouth daily. 90 tablet 3 11/08/2018 at Unknown time  . aspirin EC 81 MG tablet Take 81 mg by mouth daily.   11/08/2018 at Unknown time  . carvedilol (COREG) 3.125 MG tablet Take 1 tablet (3.125 mg total) by mouth 2 (two) times daily with a meal. 60 tablet 0 11/08/2018 at Unknown time  . Cholecalciferol (VITAMIN D) 2000 units tablet Take 2,000 Units by mouth daily.   11/08/2018 at Unknown time  . KLOR-CON 10 10 MEQ tablet Take 10 mEq by mouth 2 (two) times daily.    11/08/2018 at Unknown  time  . LASIX 20 MG tablet TAKE ONE TABLET BY MOUTH 3 TIMES DAILY (Patient taking differently: Take 20 mg by mouth 3 (three) times daily. ) 270 tablet 0 11/08/2018 at Unknown time  . losartan (COZAAR) 25 MG tablet TAKE 1/2 TABLET EVERY DAY (Patient taking differently: Take 12.5 mg by mouth daily. ) 30 tablet 3 11/08/2018 at Unknown time  . Menthol (ICY HOT) 5 % PTCH Apply 1 patch topically daily as needed (pain).   prn at prn  . metolazone (ZAROXOLYN) 5 MG tablet Take 5 mg by mouth daily as needed (severe fluid retention).   prn at prn  . Probiotic Product (RISA-BID PROBIOTIC PO) Take 1 capsule by mouth daily.    11/08/2018 at Unknown time  . spironolactone (ALDACTONE) 25 MG tablet Take 25 mg by mouth daily.    11/08/2018 at Unknown time  . vitamin C  (ASCORBIC ACID) 500 MG tablet Take 500 mg by mouth daily.   11/08/2018 at Unknown time  . warfarin (COUMADIN) 6 MG tablet Take 3-6 mg by mouth See admin instructions. Take 1 tablet (6mg ) by mouth every Monday, Wednesday, Friday evening and take  tablet (3mg ) by mouth every Tuesday, Thursday, Saturday and Sunday evening   11/08/2018 at Unknown time    Assessment: Pharmacy was consulted to start heparin bridge with warfarin. Pt was on warfarin previously for atrial fibrillation with mechanical mitral valve. Per coumadin clinic notes, patient's INR goal is 2.5 - 3.5. Her home evening dose of warfarin is: 6 mg MWF and 3 mg TThSaSun. On admission patient's INR was 3.5 and patient had a hematoma in her leg. Pt went for I&D on 5/13. Vitamin K was given 5/12 and INR has came down to 1.2 for procedure.  Drug interactions: Amiodarone which can increase INR.   Heparin infusion running @ 950 units/hr  5/14 HL 0.4  5/15 HL 0.42 5/16 HL 0.3   Date INR Warfarin Dose  5/11 3.2 Held  5/12 2.8 Held and gave Vitamin K IV  5/13 1.2 6 mg  5/14 1.2 6 mg  5/15 1.3 6 mg  5/16 1.6 6 mg     Goal of Therapy:  INR 2-3, given recent bleed (per cardiology). Heparin level 0.3-0.7 units/ml Monitor platelets by anticoagulation protocol: Yes   Plan:  Heparin Treatment: 5/16 @ 0511 HL: 0.30. Level remains therapeutic. Will continue current infusion rate of 950 units/hr. Continue to check anti-Xa levels and CBCs daily while on heparin. Continue to monitor H&H and platelets.   5/17:  HL @ 0521 = 0.2 Will order Heparin 850 units IV X 1 bolus and increase drip rate to 1050 units/hr.  Will recheck HL 8 hrs after rate change.   Warfarin Treatment:  INR subtherapeutic-  D/c heparin gtt once INR > 2. Major DDI with amiodarone. Will order a 6 mg dose tonight. Predict INR to continue to trend up. Will need to continue to give higher doses of warfarin to overcome the vitamin K given on 5/12. Once INR is greater than > 2  recommend to restart home regimen.   Orene Desanctis, PharmD Clinical Pharmacist 11/15/2018 6:24 AM

## 2018-11-15 NOTE — Progress Notes (Addendum)
[facility] for Heparin and Warfarin  Indication: atrial fibrillation  Allergies  Allergen Reactions  . Ace Inhibitors     Cough  . Doxycycline Nausea Only  . Hydrocodone Itching and Nausea Only  . Mercury     Other reaction(s): Unknown  . Silver Dermatitis    Severe itching  . Cephalexin Rash  . Clindamycin/Lincomycin Rash  . Nickel Rash  . Penicillins Rash    Has patient had a PCN reaction causing immediate rash, facial/tongue/throat swelling, SOB or lightheadedness with hypotension: YES Has patient had a PCN reaction causing severe rash involving mucus membranes or skin necrosis: NO Has patient had a PCN reaction that required hospitalization NO Has patient had a PCN reaction occurring within the last 10 years: NO If all of the above answers are "NO", then may proceed with Cephalosporin use.    Patient Measurements: Height: 4\' 11"  (149.9 cm) Weight: 138 lb 0.1 oz (62.6 kg) IBW/kg (Calculated) : 43.2 Heparin Dosing Weight: 62.6 kg  Vital Signs: Temp: 98.4 F (36.9 C) (05/17 0809) Temp Source: Oral (05/17 0809) BP: 119/65 (05/17 0809) Pulse Rate: 62 (05/17 0809)  Labs: Recent Labs    11/13/18 0536 11/14/18 0511 11/15/18 0521  HGB 8.1* 8.2* 8.2*  HCT 27.1* 27.0* 26.6*  PLT 126* 134* 153  LABPROT 16.2* 19.0* 21.7*  INR 1.3* 1.6* 1.9*  HEPARINUNFRC 0.42 0.30 0.20*  CREATININE 1.95* 1.93* 1.81*    Estimated Creatinine Clearance: 19.6 mL/min (A) (by C-G formula based on SCr of 1.81 mg/dL (H)).   Medical History: Past Medical History:  Diagnosis Date  . Arthritis    "back, hands, knees" (04/10/2016)  . Chronic atrial fibrillation    a. On Coumadin  . Chronic back pain   . 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 EF 25% by echo in 08/2015 b. RHC in 08/2015 showed normal filling pressures  . Chronic kidney disease   . Compression fracture    "several; all in my back"  (04/10/2016)  . GERD (gastroesophageal reflux disease)   . Hypertension   . Lymphedema   . Pacemaker 05/2017  . S/P MVR (mitral valve replacement)    a. MVR 1994 b. redo MVR in 08/2014 - on Coumadin  . Shortness of breath dyspnea     Medications:  Medications Prior to Admission  Medication Sig Dispense Refill Last Dose  . acetaminophen (TYLENOL) 650 MG CR tablet Take 650 mg by mouth every 8 (eight) hours as needed for pain.   prn at prn  . ALPRAZolam (XANAX) 0.25 MG tablet Take 0.25 mg by mouth at bedtime as needed for sleep.   prn at prn  . amiodarone (PACERONE) 200 MG tablet Take 1 tablet (200 mg total) by mouth daily. 90 tablet 3 11/08/2018 at Unknown time  . aspirin EC 81 MG tablet Take 81 mg by mouth daily.   11/08/2018 at Unknown time  . carvedilol (COREG) 3.125 MG tablet Take 1 tablet (3.125 mg total) by mouth 2 (two) times daily with a meal. 60 tablet 0 11/08/2018 at Unknown time  . Cholecalciferol (VITAMIN D) 2000 units tablet Take 2,000 Units by mouth daily.   11/08/2018 at Unknown time  . KLOR-CON 10 10 MEQ tablet Take 10 mEq by mouth 2 (two) times daily.    11/08/2018 at Unknown time  . LASIX 20 MG tablet TAKE ONE TABLET BY MOUTH 3 TIMES DAILY (Patient taking differently: Take 20 mg by  mouth 3 (three) times daily. ) 270 tablet 0 11/08/2018 at Unknown time  . losartan (COZAAR) 25 MG tablet TAKE 1/2 TABLET EVERY DAY (Patient taking differently: Take 12.5 mg by mouth daily. ) 30 tablet 3 11/08/2018 at Unknown time  . Menthol (ICY HOT) 5 % PTCH Apply 1 patch topically daily as needed (pain).   prn at prn  . metolazone (ZAROXOLYN) 5 MG tablet Take 5 mg by mouth daily as needed (severe fluid retention).   prn at prn  . Probiotic Product (RISA-BID PROBIOTIC PO) Take 1 capsule by mouth daily.    11/08/2018 at Unknown time  . spironolactone (ALDACTONE) 25 MG tablet Take 25 mg by mouth daily.    11/08/2018 at Unknown time  . vitamin C (ASCORBIC ACID) 500 MG tablet Take 500 mg by mouth daily.    11/08/2018 at Unknown time  . warfarin (COUMADIN) 6 MG tablet Take 3-6 mg by mouth See admin instructions. Take 1 tablet (6mg ) by mouth every Monday, Wednesday, Friday evening and take  tablet (3mg ) by mouth every Tuesday, Thursday, Saturday and Sunday evening   11/08/2018 at Unknown time    Assessment: Pharmacy was consulted to start heparin bridge with warfarin. Pt was on warfarin previously for atrial fibrillation with mechanical mitral valve. Per coumadin clinic notes, patient's INR goal is 2.5 - 3.5. Her home evening dose of warfarin is: 6 mg MWF and 3 mg TThSaSun. On admission patient's INR was 3.5 and patient had a hematoma in her leg. Pt went for I&D on 5/13. Vitamin K was given 5/12 and INR has came down to 1.2 for procedure.  Drug interactions: Amiodarone which can increase INR.   Heparin infusion running @ 950 units/hr  5/14 HL 0.4  5/15 HL 0.42 5/16 HL 0.3  5/17 HL 0.2 bolus 850 units x 1 increase rate to 1050.   Date INR Warfarin Dose  5/11 3.2 Held  5/12 2.8 Held and gave Vitamin K IV  5/13 1.2 6 mg  5/14 1.2 6 mg  5/15 1.3 6 mg  5/16 1.6 6 mg   5/17 1.9 3 mg    Goal of Therapy:  INR 2-3, given recent bleed (per cardiology). Heparin level 0.3-0.7 units/ml Monitor platelets by anticoagulation protocol: Yes   Plan:  Heparin Treatment: Heparin level is  subtherapeutic. Will increase infusion rate to 1050 units/hr. Continue to check anti-Xa levels in 8 hours and CBCs daily while on heparin. Continue to monitor H&H and platelets.   Warfarin Treatment:  INR slightly subtherapeutic-  D/c heparin gtt once INR > 2. Major DDI with amiodarone. Will order a 3 mg dose tonight, recommend to continue with home starting today. Predict INR will be > 2 tomorrow and we can d/c heparin tomorrow. Pt needed higher warfarin doses on Thursday and Saturday to overcome the vitamin K given on 5/12.    Oswald Hillock, PharmD, BCPS Clinical Pharmacist 11/15/2018 8:57 AM

## 2018-11-15 NOTE — Progress Notes (Signed)
Patient woken up from nap by lab tech and is very disoriented. She has been calling her daughter saying crazy things were happening and called 911 who notified us. She is not agitated or impulsive, just confused. Daughter is concerned as this seemed to be clearing up earlier today. Could be mix of hospital associated delirium and sleep deprivation. She has not had any narcotics today. Tylenol given once at 2pm for leg pain.   Dr Stark Jock was messaged regarding daughter's concern. Daughter stated she wants to be notified before any narcotics/sedatives are given.

## 2018-11-15 NOTE — Progress Notes (Signed)
[facility] NAME: [name]    MR#:  [id number]  DATE OF BIRTH:  02/16/1935  SUBJECTIVE:  CHIEF COMPLAINT:   Chief Complaint  Patient presents with  . Leg Swelling   Patient still complaining of constipation.  PRN lactulose ordered.  Complaining of dressing on left lower extremity being tight.  I have notified the surgeon who the evaluating the dressing shortly. Physical therapist at bedside to do bed exercises.  REVIEW OF SYSTEMS:  Review of Systems  Constitutional: Negative for chills and fever.  HENT: Negative for hearing loss and tinnitus.   Eyes: Negative for blurred vision and double vision.  Respiratory: Negative for cough and shortness of breath.   Cardiovascular: Negative for chest pain and palpitations.  Gastrointestinal: Negative for abdominal pain, heartburn, nausea and vomiting.  Genitourinary: Negative for dysuria and urgency.  Musculoskeletal: Negative for back pain and myalgias.  Skin: Negative for itching and rash.  Neurological: Negative for dizziness and headaches.  Psychiatric/Behavioral: Negative for depression and hallucinations.    DRUG ALLERGIES:   Allergies  Allergen Reactions  . Ace Inhibitors     Cough  . Doxycycline Nausea Only  . Hydrocodone Itching and Nausea Only  . Mercury     Other reaction(s): Unknown  . Silver Dermatitis    Severe itching  . Cephalexin Rash  . Clindamycin/Lincomycin Rash  . Nickel Rash  . Penicillins Rash    Has patient had a PCN reaction causing immediate rash, facial/tongue/throat swelling, SOB or lightheadedness with hypotension: YES Has patient had a PCN reaction causing severe rash involving mucus membranes or skin necrosis: NO Has patient had a PCN reaction that required hospitalization NO Has patient had a PCN reaction occurring within the last 10 years: NO If all of the above answers are "NO", then may proceed with Cephalosporin use.   VITALS:   Blood pressure 119/65, pulse 62, temperature 98.4 F (36.9 C), temperature source Oral, resp. rate 18, height 4\' 11"  (1.499 m), weight 62.6 kg, SpO2 98 %. PHYSICAL EXAMINATION:   GENERAL:  84 y.o.-year-old elderly patient lying in the bed with no acute distress.  EYES: Pupils equal, round, reactive to light and accommodation. No scleral icterus. Extraocular muscles intact.  HEENT: Head atraumatic, normocephalic. Oropharynx and nasopharynx clear.  NECK:  Supple, no jugular venous distention. No thyroid enlargement, no tenderness.  LUNGS: Normal breath sounds bilaterally, no wheezing, rhonchi or crepitation.  Decreased bibasilar breath sounds. no use of accessory muscles of respiration.  CARDIOVASCULAR: S1, S2 normal. No  rubs, or gallops.  ABDOMEN: Soft, nontender, nondistended. Bowel sounds present. No organomegaly or mass.  EXTREMITIES: No pedal edema, cyanosis, or clubbing.  Left leg in Ace wrap dressing.  No evidence of any bleeding clinically.  Good capillary refill on left toes. NEUROLOGIC: Cranial nerves II through XII are intact. Muscle strength 5/5 in all extremities. Sensation intact. Gait not checked.  Global weakness noted. PSYCHIATRIC: The patient is alert and oriented x 3.  SKIN: No obvious rash, lesion, or ulcer.   LABORATORY PANEL:  Female CBC Recent Labs  Lab 11/15/18 0521  WBC 6.9  HGB 8.2*  HCT 26.6*  PLT 153   ------------------------------------------------------------------------------------------------------------------ Chemistries  Recent Labs  Lab 11/09/18 0148  11/15/18 0521  NA 134*   < > 137  K 3.8   < > 4.4  CL 100   < > 100  CO2 27   < > 28  GLUCOSE 129*   < >  93  BUN 101*   < > 71*  CREATININE 2.38*   < > 1.81*  CALCIUM 8.5*   < > 8.6*  AST 18  --   --   ALT 18  --   --   ALKPHOS 61  --   --   BILITOT 0.7  --   --    < > = values in this interval not displayed.   RADIOLOGY:  No results found. ASSESSMENT AND PLAN:   84 year old female  with past medical history significant for mitral valve replacement with a mechanical valve on chronic Coumadin, atrial fibrillation/flutter status post pacemaker, hypertension, chronic systolic heart failure, lymphedema, GERD and hypertension, pulmonary hypertension on chronic 2 L home oxygen brought from home secondary to left lower extremity hematoma.  1.  Left lower extremity hematoma-after a trauma while on Coumadin. -Appreciate surgical consult.  Patient had debridement of the left calf hematoma and a biological graft placed in on 11/11/2018. Patient was taken to the OR for dressing change on 11/13/2018.  Due to patient complaining of dressing being tight, I have notified surgeon on call who will examine and make dressing last night.  Patient has good capillary refill.  Physical therapist at bedside to do bed exercises.  No further bleeding.  2.  Acute blood loss anemia-secondary to bleeding into her hematoma -Received 2 units packed RBC transfusion this admission.  Hemoglobin remained stable at 8.2 today. -Continue to monitor hemoglobin closely.    Check in a.m.   3.  Acute on chronic systolic heart failure exacerbation- Clinically improved.  Denies any shortness of breath. Remains on chronic 2 L oxygen. -Appreciate cardiology input.  Patient has a pacemaker, atrial flutter/fibrillation status post DC cardioversion last week. Patient already transitioned to torsemide 40 mg p.o. daily.  No ACE inhibitor due to underlying CKD and relative hypotension.Last echo with EF of 25%  4.  History of mechanical mitral valve surgery-Coumadin has been restarted.  On heparin drip-and for the heparin levels due to ongoing bleeding.  INR of 1.9 today. -Cardiology recommended goal INR around 2.5 given her increased risk of bleeding  5.  CKD stage IV-Baseline creatinine seems to be around 2.3. Renal function remains fairly stable at this time with creatinine of 1.81  I called daughter [name] and  updated her on treatment plans as outlined above yesterday..  All questions were answered.  Attempted calling again today but no response.  Left voice message.  All the records are reviewed and case discussed with Care Management/Social Worker. Management plans discussed with the patient, family and they are in agreement.  CODE STATUS: Full Code  TOTAL TIME TAKING CARE OF THIS PATIENT: 36 minutes.   More than 50% of the time was spent in counseling/coordination of care: YES  POSSIBLE D/C IN 3 DAYS, DEPENDING ON CLINICAL CONDITION.   Kaytee Taliercio M.D on 11/15/2018 at 1:45 PM  Between 7am to 6pm - Pager - [phone]  After 6pm go to [email or web address] - Proofreader  [facility]  Office  [phone]  CC: Primary care physician; Rusty Aus, MD  Note: This dictation was prepared with Dragon dictation along with smaller phrase technology. Any transcriptional errors that result from this process are unintentional.

## 2018-11-15 NOTE — Progress Notes (Signed)
Patient complaining of constipation (small bowel movement documented yesterday). Miralax given this morning as ordered. Dr. Stark Jock placed PRN order for lactulose, but patient currently refusing. States she prefers to try some prune juice but not until later in the day. Will continue to try.   Patient has also been complaining of left leg dressing being too tight. Toes with good cap refill. Dr. Bary Castilla called stating his partner is in the OR and can be up to check on the patient after he finishes. Suggested I unwrap the outer ACE wrap for some relief in the meantime, but patient refuses to let me touch it.States she prefers a doctor to do it. She is resting comfortably at this time with daughter at bedside. Daughter stated that night shift cut a small slit in the dressing and that seemed to relieve some of the discomfort.

## 2018-11-15 NOTE — Progress Notes (Signed)
Physical Therapy Treatment Patient Details Name: [name] MRN: [id number] DOB: 01/14/1935 Today's Date: 11/15/2018    History of Present Illness Pt is an 84 y.o. female with recent hospital discharge 11/08/18 (CHF exacerbation with cardioversion); returned to hospital 11/09/18 with ruptured large L LE hematoma.  Pt admitted with acute blood loss anemia from large L LE hematoma with bleeding (likely secondary anticoagulation therapy for a-fib, MVR, and pacemaker).  S/p debridement of L LE hematoma 11/11/18.  11/13/18 s/p dressing change of L LE wound with application of Neox graft.  PMH includes cardioversion 11/06/18, chronic a-fib, chronic back pain, systolic and diastolic CHF, CKD, compression fx back, pacemaker, s/p MVR, pulmonary htn, chronic venous insufficiency, B LE edema, and chronic 3 L O2 at home.    PT Comments    Pt agreeable to bed level exercises, family at bedside. PT provided handout for supine exercises as well. Pt exhibited mild pain signs/symptoms with movement of LLE, but able to perform exercises without physical assist. Extended time spent readjusting pt in bed and maintaining precautions as well as LLE elevation at end of session. Pt fatigued quickly, fell asleep while performing exercises, easily woken and re-oriented. Overall the pt would benefit from further skilled PT to progress towards goals and further mobility assessment when able.      Follow Up Recommendations  SNF(pending further assessment)     Equipment Recommendations  Wheelchair (measurements PT);Wheelchair cushion (measurements PT);3in1 (PT);Hospital bed;Rolling walker with 5" wheels    Recommendations for Other Services       Precautions / Restrictions Precautions Precautions: Fall Precaution Comments: Elevate L leg on one pillow supporting knee to ankle--maintain flexion at knee of 20-30 degrees; minimize any ankle/knee ROM    Mobility  Bed Mobility               General bed mobility  comments: deferred per most recent PT order  Transfers                    Ambulation/Gait                 Stairs             Wheelchair Mobility    Modified Rankin (Stroke Patients Only)       Balance                                            Cognition Arousal/Alertness: Awake/alert Behavior During Therapy: WFL for tasks assessed/performed Overall Cognitive Status: Within Functional Limits for tasks assessed                                 General Comments: 1 instance of pt falling asleep mid session, able to reorient      Exercises General Exercises - Lower Extremity Ankle Circles/Pumps: AROM;Right;10 reps Quad Sets: AROM;Left;10 reps Heel Slides: AROM;Both;10 reps Hip ABduction/ADduction: AAROM;10 reps;Left;AROM;Right    General Comments        Pertinent Vitals/Pain Pain Assessment: Faces Faces Pain Scale: Hurts a little bit Pain Location: L LE Pain Intervention(s): Limited activity within patient's tolerance;Monitored during session;Repositioned    Home Living                      Prior Function  PT Goals (current goals can now be found in the care plan section) Progress towards PT goals: Not progressing toward goals - comment(limited PT treatment to just bed level exercises)    Frequency    Min 2X/week      PT Plan Current plan remains appropriate    Co-evaluation              AM-PAC PT "6 Clicks" Mobility   Outcome Measure  Help needed turning from your back to your side while in a flat bed without using bedrails?: A Lot Help needed moving from lying on your back to sitting on the side of a flat bed without using bedrails?: A Lot Help needed moving to and from a bed to a chair (including a wheelchair)?: Total Help needed standing up from a chair using your arms (e.g., wheelchair or bedside chair)?: Total Help needed to walk in hospital room?: Total Help  needed climbing 3-5 steps with a railing? : Total 6 Click Score: 8    End of Session Equipment Utilized During Treatment: Oxygen Activity Tolerance: Patient limited by fatigue Patient left: in bed;with family/visitor present;with call bell/phone within reach;with bed alarm set Nurse Communication: Mobility status PT Visit Diagnosis: Other abnormalities of gait and mobility (R26.89);Muscle weakness (generalized) (M62.81);Difficulty in walking, not elsewhere classified (R26.2);Pain Pain - Right/Left: Left Pain - part of body: Leg     Time: 3748-2707 PT Time Calculation (min) (ACUTE ONLY): 31 min  Charges:  $Therapeutic Exercise: 8-22 mins                     Lieutenant Diego PT, DPT [phone] PM,11/15/18 [phone]

## 2018-11-16 ENCOUNTER — Encounter: Payer: Self-pay | Admitting: Anesthesiology

## 2018-11-16 LAB — CBC
HCT: 23.5 % — ABNORMAL LOW (ref 36.0–46.0)
Hemoglobin: 7.1 g/dL — ABNORMAL LOW (ref 12.0–15.0)
MCH: 28 pg (ref 26.0–34.0)
MCHC: 30.2 g/dL (ref 30.0–36.0)
MCV: 92.5 fL (ref 80.0–100.0)
Platelets: 132 10*3/uL — ABNORMAL LOW (ref 150–400)
RBC: 2.54 MIL/uL — ABNORMAL LOW (ref 3.87–5.11)
RDW: 16.7 % — ABNORMAL HIGH (ref 11.5–15.5)
WBC: 4.5 10*3/uL (ref 4.0–10.5)
nRBC: 0 % (ref 0.0–0.2)

## 2018-11-16 LAB — URINALYSIS, COMPLETE (UACMP) WITH MICROSCOPIC
Bilirubin Urine: NEGATIVE
Glucose, UA: NEGATIVE mg/dL
Ketones, ur: NEGATIVE mg/dL
Nitrite: POSITIVE — AB
Protein, ur: NEGATIVE mg/dL
Specific Gravity, Urine: 1.01 (ref 1.005–1.030)
Squamous Epithelial / HPF: NONE SEEN (ref 0–5)
pH: 5 (ref 5.0–8.0)

## 2018-11-16 LAB — BASIC METABOLIC PANEL
Anion gap: 10 (ref 5–15)
BUN: 69 mg/dL — ABNORMAL HIGH (ref 8–23)
CO2: 29 mmol/L (ref 22–32)
Calcium: 8.2 mg/dL — ABNORMAL LOW (ref 8.9–10.3)
Chloride: 98 mmol/L (ref 98–111)
Creatinine, Ser: 1.7 mg/dL — ABNORMAL HIGH (ref 0.44–1.00)
GFR calc Af Amer: 32 mL/min — ABNORMAL LOW (ref >60)
GFR calc non Af Amer: 28 mL/min — ABNORMAL LOW (ref >60)
Glucose, Bld: 88 mg/dL (ref 70–99)
Potassium: 3.7 mmol/L (ref 3.5–5.1)
Sodium: 137 mmol/L (ref 135–145)

## 2018-11-16 LAB — MAGNESIUM: Magnesium: 2.4 mg/dL (ref 1.7–2.4)

## 2018-11-16 LAB — AEROBIC CULTURE W GRAM STAIN (SUPERFICIAL SPECIMEN)

## 2018-11-16 LAB — HEMOGLOBIN AND HEMATOCRIT, BLOOD
HCT: 28.9 % — ABNORMAL LOW (ref 36.0–46.0)
Hemoglobin: 9.1 g/dL — ABNORMAL LOW (ref 12.0–15.0)

## 2018-11-16 LAB — HEPARIN LEVEL (UNFRACTIONATED): Heparin Unfractionated: 0.33 IU/mL (ref 0.30–0.70)

## 2018-11-16 LAB — PROTIME-INR
INR: 2.3 — ABNORMAL HIGH (ref 0.8–1.2)
Prothrombin Time: 24.8 seconds — ABNORMAL HIGH (ref 11.4–15.2)

## 2018-11-16 LAB — PREPARE RBC (CROSSMATCH)

## 2018-11-16 MED ORDER — KETOROLAC TROMETHAMINE 30 MG/ML IJ SOLN
INTRAMUSCULAR | Status: AC
  Filled 2018-11-16: qty 1

## 2018-11-16 MED ORDER — ONDANSETRON HCL 4 MG/2ML IJ SOLN
INTRAMUSCULAR | Status: AC
  Filled 2018-11-16: qty 8

## 2018-11-16 MED ORDER — GLYCOPYRROLATE 0.2 MG/ML IJ SOLN
INTRAMUSCULAR | Status: AC
  Filled 2018-11-16: qty 2

## 2018-11-16 MED ORDER — FENTANYL CITRATE (PF) 100 MCG/2ML IJ SOLN
INTRAMUSCULAR | Status: AC
  Filled 2018-11-16: qty 2

## 2018-11-16 MED ORDER — PROPOFOL 500 MG/50ML IV EMUL
INTRAVENOUS | Status: AC
  Filled 2018-11-16: qty 50

## 2018-11-16 MED ORDER — WARFARIN SODIUM 6 MG PO TABS
6.0000 mg | ORAL_TABLET | Freq: Once | ORAL | Status: DC
  Filled 2018-11-16: qty 1

## 2018-11-16 MED ORDER — SODIUM CHLORIDE 0.9% IV SOLUTION
Freq: Once | INTRAVENOUS | Status: AC
  Administered 2018-11-16: 10 mL via INTRAVENOUS

## 2018-11-16 MED ORDER — SODIUM CHLORIDE 0.9 % IV SOLN
1.0000 g | Freq: Three times a day (TID) | INTRAVENOUS | Status: DC
  Administered 2018-11-16 – 2018-11-20 (×12): 1 g via INTRAVENOUS
  Filled 2018-11-16 (×14): qty 1

## 2018-11-16 MED ORDER — SUGAMMADEX SODIUM 200 MG/2ML IV SOLN
INTRAVENOUS | Status: AC
  Filled 2018-11-16: qty 10

## 2018-11-16 MED ORDER — SODIUM CHLORIDE 0.9 % IV SOLN
500.0000 mg | Freq: Two times a day (BID) | INTRAVENOUS | Status: DC
  Filled 2018-11-16 (×2): qty 0.5

## 2018-11-16 MED ORDER — DEXMEDETOMIDINE HCL IN NACL 80 MCG/20ML IV SOLN
INTRAVENOUS | Status: AC
  Filled 2018-11-16: qty 20

## 2018-11-16 MED ORDER — DEXAMETHASONE SODIUM PHOSPHATE 10 MG/ML IJ SOLN
INTRAMUSCULAR | Status: AC
  Filled 2018-11-16: qty 3

## 2018-11-16 MED ORDER — EPHEDRINE SULFATE 50 MG/ML IJ SOLN
INTRAMUSCULAR | Status: AC
  Filled 2018-11-16: qty 1

## 2018-11-16 MED ORDER — METOPROLOL TARTRATE 5 MG/5ML IV SOLN
INTRAVENOUS | Status: AC
  Filled 2018-11-16: qty 10

## 2018-11-16 MED ORDER — PHENYLEPHRINE HCL (PRESSORS) 10 MG/ML IV SOLN
INTRAVENOUS | Status: AC
  Filled 2018-11-16: qty 2

## 2018-11-16 MED ORDER — LIDOCAINE HCL (PF) 2 % IJ SOLN
INTRAMUSCULAR | Status: AC
  Filled 2018-11-16: qty 20

## 2018-11-16 MED ORDER — SUCCINYLCHOLINE CHLORIDE 20 MG/ML IJ SOLN
INTRAMUSCULAR | Status: AC
  Filled 2018-11-16: qty 2

## 2018-11-16 NOTE — Progress Notes (Signed)
[place] [facility] for Heparin and Warfarin  Indication: atrial fibrillation  Allergies  Allergen Reactions  . Ace Inhibitors     Cough  . Doxycycline Nausea Only  . Hydrocodone Itching and Nausea Only  . Mercury     Other reaction(s): Unknown  . Silver Dermatitis    Severe itching  . Cephalexin Rash  . Clindamycin/Lincomycin Rash  . Nickel Rash  . Penicillins Rash    Has patient had a PCN reaction causing immediate rash, facial/tongue/throat swelling, SOB or lightheadedness with hypotension: YES Has patient had a PCN reaction causing severe rash involving mucus membranes or skin necrosis: NO Has patient had a PCN reaction that required hospitalization NO Has patient had a PCN reaction occurring within the last 10 years: NO If all of the above answers are "NO", then may proceed with Cephalosporin use.    Patient Measurements: Height: 4\' 11"  (149.9 cm) Weight: 136 lb 15.9 oz (62.1 kg) IBW/kg (Calculated) : 43.2 Heparin Dosing Weight: 62.6 kg  Vital Signs: Temp: 98.1 F (36.7 C) (05/18 0746) Temp Source: Oral (05/18 0746) BP: 114/49 (05/18 0746) Pulse Rate: 59 (05/18 0746)  Labs: Recent Labs    11/14/18 0511 11/15/18 0521 11/15/18 1533 11/16/18 0008 11/16/18 0608  HGB 8.2* 8.2*  --   --  7.1*  HCT 27.0* 26.6*  --   --  23.5*  PLT 134* 153  --   --  132*  LABPROT 19.0* 21.7*  --   --  24.8*  INR 1.6* 1.9*  --   --  2.3*  HEPARINUNFRC 0.30 0.20* 0.32 0.33  --   CREATININE 1.93* 1.81*  --   --  1.70*    Estimated Creatinine Clearance: 20.8 mL/min (A) (by C-G formula based on SCr of 1.7 mg/dL (H)).   Medical History: Past Medical History:  Diagnosis Date  . Arthritis    "back, hands, knees" (04/10/2016)  . Chronic atrial fibrillation    a. On Coumadin  . Chronic back pain   . 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 EF 25% by echo in 08/2015 b. RHC in 08/2015 showed normal  filling pressures  . Chronic kidney disease   . Compression fracture    "several; all in my back" (04/10/2016)  . GERD (gastroesophageal reflux disease)   . Hypertension   . Lymphedema   . Pacemaker 05/2017  . S/P MVR (mitral valve replacement)    a. MVR 1994 b. redo MVR in 08/2014 - on Coumadin  . Shortness of breath dyspnea      Assessment: Pharmacy was consulted to start heparin bridge with warfarin. Pt was on warfarin previously for atrial fibrillation with mechanical mitral valve. Per coumadin clinic notes, patient's INR goal is 2.5 - 3.5. Her home evening dose of warfarin is: 6 mg MWF and 3 mg TThSaSun. On admission patient's INR was 3.5 and patient had a hematoma in her leg. Pt went for I&D on 5/13. Vitamin K was given 5/12 and INR has came down to 1.2 for procedure.  Drug interactions: Amiodarone which can increase INR.   Heparin infusion running @ 950 units/hr  5/14 HL 0.4  5/15 HL 0.42 5/16 HL 0.3  5/17 HL 0.2 bolus 850 units x 1 increase rate to 1050.  5/17 HL 0.3 @ 1533 - therapeutic x 1 Continue rate 1050/hr 5/18 HL 0.33 @ 0008   Date INR Warfarin Dose  5/11 3.2 Held  5/12 2.8  Held and gave Vitamin K IV  5/13 1.2 6 mg  5/14 1.2 6 mg  5/15 1.3 6 mg  5/16 1.6 6 mg   5/17 1.9 --  5/18 2.3     Goal of Therapy:  INR 2-3, given recent bleed (per cardiology). Heparin level 0.3-0.7 units/ml Monitor platelets by anticoagulation protocol: Yes   Plan:  Warfarin Treatment:  INR therapeutic at 2.3 - Major DDI with amiodarone. Dose not given yesterday? Will order home dose of warfarin 6 mg for tonight.  D/c heparin gtt once INR > 2. Pt needed higher warfarin doses on Thursday and Saturday to overcome the vitamin K given on 5/12.    Rocky Morel, PharmD Clinical Pharmacist 11/16/2018 8:28 AM

## 2018-11-16 NOTE — OR Nursing (Signed)
Made Dr Kayleen Memos aware of patient lab work

## 2018-11-16 NOTE — Care Management Important Message (Signed)
Important Message  Patient Details  Name: [name] MRN: [id number] Date of Birth: [birth year]   Medicare Important Message Given:  Yes    Elza Rafter, RN 11/16/2018, 10:46 AM

## 2018-11-16 NOTE — Progress Notes (Signed)
[place] for Heparin and Warfarin  Indication: atrial fibrillation  Allergies  Allergen Reactions  . Ace Inhibitors     Cough  . Doxycycline Nausea Only  . Hydrocodone Itching and Nausea Only  . Mercury     Other reaction(s): Unknown  . Silver Dermatitis    Severe itching  . Cephalexin Rash  . Clindamycin/Lincomycin Rash  . Nickel Rash  . Penicillins Rash    Has patient had a PCN reaction causing immediate rash, facial/tongue/throat swelling, SOB or lightheadedness with hypotension: YES Has patient had a PCN reaction causing severe rash involving mucus membranes or skin necrosis: NO Has patient had a PCN reaction that required hospitalization NO Has patient had a PCN reaction occurring within the last 10 years: NO If all of the above answers are "NO", then may proceed with Cephalosporin use.    Patient Measurements: Height: 4\' 11"  (149.9 cm) Weight: 138 lb 0.1 oz (62.6 kg) IBW/kg (Calculated) : 43.2 Heparin Dosing Weight: 62.6 kg  Vital Signs: Temp: 99.2 F (37.3 C) (05/17 1614) Temp Source: Oral (05/17 1614) BP: 97/44 (05/17 1928) Pulse Rate: 64 (05/17 1928)  Labs: Recent Labs    11/13/18 0536 11/14/18 0511 11/15/18 0521 11/15/18 1533 11/16/18 0008  HGB 8.1* 8.2* 8.2*  --   --   HCT 27.1* 27.0* 26.6*  --   --   PLT 126* 134* 153  --   --   LABPROT 16.2* 19.0* 21.7*  --   --   INR 1.3* 1.6* 1.9*  --   --   HEPARINUNFRC 0.42 0.30 0.20* 0.32 0.33  CREATININE 1.95* 1.93* 1.81*  --   --     Estimated Creatinine Clearance: 19.6 mL/min (A) (by C-G formula based on SCr of 1.81 mg/dL (H)).   Medical History: Past Medical History:  Diagnosis Date  . Arthritis    "back, hands, knees" (04/10/2016)  . Chronic atrial fibrillation    a. On Coumadin  . Chronic back pain   . 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 EF 25% by echo in 08/2015 b. RHC in 08/2015 showed normal  filling pressures  . Chronic kidney disease   . Compression fracture    "several; all in my back" (04/10/2016)  . GERD (gastroesophageal reflux disease)   . Hypertension   . Lymphedema   . Pacemaker 05/2017  . S/P MVR (mitral valve replacement)    a. MVR 1994 b. redo MVR in 08/2014 - on Coumadin  . Shortness of breath dyspnea     Medications:  Medications Prior to Admission  Medication Sig Dispense Refill Last Dose  . acetaminophen (TYLENOL) 650 MG CR tablet Take 650 mg by mouth every 8 (eight) hours as needed for pain.   prn at prn  . ALPRAZolam (XANAX) 0.25 MG tablet Take 0.25 mg by mouth at bedtime as needed for sleep.   prn at prn  . amiodarone (PACERONE) 200 MG tablet Take 1 tablet (200 mg total) by mouth daily. 90 tablet 3 11/08/2018 at Unknown time  . aspirin EC 81 MG tablet Take 81 mg by mouth daily.   11/08/2018 at Unknown time  . carvedilol (COREG) 3.125 MG tablet Take 1 tablet (3.125 mg total) by mouth 2 (two) times daily with a meal. 60 tablet 0 11/08/2018 at Unknown time  . Cholecalciferol (VITAMIN D) 2000 units tablet Take 2,000 Units by mouth daily.   11/08/2018 at Unknown time  .  KLOR-CON 10 10 MEQ tablet Take 10 mEq by mouth 2 (two) times daily.    11/08/2018 at Unknown time  . LASIX 20 MG tablet TAKE ONE TABLET BY MOUTH 3 TIMES DAILY (Patient taking differently: Take 20 mg by mouth 3 (three) times daily. ) 270 tablet 0 11/08/2018 at Unknown time  . losartan (COZAAR) 25 MG tablet TAKE 1/2 TABLET EVERY DAY (Patient taking differently: Take 12.5 mg by mouth daily. ) 30 tablet 3 11/08/2018 at Unknown time  . Menthol (ICY HOT) 5 % PTCH Apply 1 patch topically daily as needed (pain).   prn at prn  . metolazone (ZAROXOLYN) 5 MG tablet Take 5 mg by mouth daily as needed (severe fluid retention).   prn at prn  . Probiotic Product (RISA-BID PROBIOTIC PO) Take 1 capsule by mouth daily.    11/08/2018 at Unknown time  . spironolactone (ALDACTONE) 25 MG tablet Take 25 mg by mouth daily.     11/08/2018 at Unknown time  . vitamin C (ASCORBIC ACID) 500 MG tablet Take 500 mg by mouth daily.   11/08/2018 at Unknown time  . warfarin (COUMADIN) 6 MG tablet Take 3-6 mg by mouth See admin instructions. Take 1 tablet (6mg ) by mouth every Monday, Wednesday, Friday evening and take  tablet (3mg ) by mouth every Tuesday, Thursday, Saturday and Sunday evening   11/08/2018 at Unknown time    Assessment: Pharmacy was consulted to start heparin bridge with warfarin. Pt was on warfarin previously for atrial fibrillation with mechanical mitral valve. Per coumadin clinic notes, patient's INR goal is 2.5 - 3.5. Her home evening dose of warfarin is: 6 mg MWF and 3 mg TThSaSun. On admission patient's INR was 3.5 and patient had a hematoma in her leg. Pt went for I&D on 5/13. Vitamin K was given 5/12 and INR has came down to 1.2 for procedure.  Drug interactions: Amiodarone which can increase INR.   Heparin infusion running @ 950 units/hr  5/14 HL 0.4  5/15 HL 0.42 5/16 HL 0.3  5/17 HL 0.2 bolus 850 units x 1 increase rate to 1050.  5/17 HL 0.3 @ 1533 - therapeutic x 1 Continue rate 1050/hr 5/18 HL 0.33 @ 0008   Date INR Warfarin Dose  5/11 3.2 Held  5/12 2.8 Held and gave Vitamin K IV  5/13 1.2 6 mg  5/14 1.2 6 mg  5/15 1.3 6 mg  5/16 1.6 6 mg   5/17 1.9 3 mg    Goal of Therapy:  INR 2-3, given recent bleed (per cardiology). Heparin level 0.3-0.7 units/ml Monitor platelets by anticoagulation protocol: Yes   Plan:  Heparin Treatment: Heparin level is therapeutic x 1. Will conitnue infusion rate of 1050 units/hr. Continue to check anti-Xa levels in 8 hours and CBCs daily while on heparin. Continue to monitor H&H and platelets.   5/18: HL @ 0008 = 0.33 Will continue pt on current rate and recheck HL on 5/19 with AM labs.   Warfarin Treatment:  INR slightly subtherapeutic-  D/c heparin gtt once INR > 2. Major DDI with amiodarone. Will order a 3 mg dose tonight, recommend to continue with  home starting today. Predict INR will be > 2 tomorrow and we can d/c heparin tomorrow. Pt needed higher warfarin doses on Thursday and Saturday to overcome the vitamin K given on 5/12.    Orene Desanctis, PharmD Clinical Pharmacist 11/16/2018 12:56 AM

## 2018-11-16 NOTE — TOC Progression Note (Signed)
Transition of Care [facility]) - Progression Note    Patient Details  Name: [name] MRN: [id number] Date of Birth: [birth year]  Transition of Care [facility]) CM/SW Contact  Elza Rafter, RN Phone Number: 11/16/2018, 3:46 PM  Clinical Narrative:  [name] with [name], daughter.  She does not want her mother to go to SNF at [place].  Prefers to take her home when medically ready with [place].     Expected Discharge Plan: [place] Barriers to Discharge: Continued Medical Work up  Expected Discharge Plan and Services Expected Discharge Plan: [place]   Discharge Planning Services: CM Consult Post Acute Care Choice: [place] arrangements for the past 2 months: Single Family Home                           HH Arranged: RN, PT [facility] Agency: [place] ([facility]) Date HH Agency Contacted: 11/09/18 Time [place]: 1428 Representative spoke with at [place]: [place] ([place]) Interventions    Readmission Risk Interventions Readmission Risk Prevention Plan 11/09/2018  Transportation Screening Complete  PCP or Specialist Appt within 3-5 Days Complete  HRI or [place] Complete  Social Work Consult for [place] Planning/Counseling Not Complete  SW consult not completed comments na  Palliative Care Screening Not Applicable  Medication Review Press [employer]) Complete  Some recent data might be hidden

## 2018-11-16 NOTE — Anesthesia Preprocedure Evaluation (Deleted)
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  Hx AFib) Atrial Fibrillation + pacemaker + Valvular Problems/Murmurs (s/p MVR)   Echo 11/05/18: 1. EF severely reduced 25-30% and mildly dilated LV. Diastolic dysfunction (not graded). Left ventrical global hypokinesis   2. RV mildly reduced systolic function. The cavity was moderately enlarged. There is no increase in right ventricular wall thickness. RV systolic pressure is moderately elevated with an estimated pressure of 53 mmHg.  3. Right atrial size was moderately dilated.  4. Left atrial size was moderately dilated.  5. The aortic valve was not well visualized. Moderate calcification of the aortic valve. Aortic valve regurgitation was not assessed by color flow Doppler. Unable to estimate degree of aortic valve stenosis given severely reduced systolic function.  6. Mechanical mitral valve. Mean gradient 5 mm Hg.  7. Tricuspid valve regurgitation is moderate.  8. Left pleural effusion noted, 6.5 cm  9. Rhythm is atrial tachycardia, rate 105 bpm    Neuro/Psych negative neurological ROS  negative psych ROS   GI/Hepatic Neg liver ROS, GERD  Controlled,  Endo/Other  negative endocrine ROS  Renal/GU CRFRenal disease  negative genitourinary   Musculoskeletal  (+) Arthritis ,   Abdominal   Peds  Hematology negative hematology ROS (+)   Anesthesia Other Findings Past Medical History: No date: Arthritis     Comment:  "back, hands, knees" (04/10/2016) No date: Chronic atrial fibrillation     Comment:  a. On Coumadin No  date: Chronic back pain No date: Chronic combined systolic (congestive) and diastolic  (congestive) heart failure (HCC)     Comment:  a. EF 25% by echo in 08/2015 b. RHC in 08/2015 showed               normal filling pressures No date: Chronic kidney disease No date: Compression fracture     Comment:  "several; all in my back" (04/10/2016) No date: GERD (gastroesophageal reflux disease) No date: Hypertension No date: Lymphedema 05/2017: Pacemaker No date: S/P MVR (mitral valve replacement)     Comment:  a. MVR 1994 b. redo MVR in 08/2014 - on Coumadin No date: Shortness of breath dyspnea  Past Surgical History: 2005?: BREAST LUMPECTOMY; Right     Comment:  benign lump excision No date: [place], 2016: CARDIAC VALVE REPLACEMENT     Comment:  "MVR; MVR" 11/06/2018: CARDIOVERSION; N/A     Comment:  Procedure: CARDIOVERSION;  Surgeon: Minna Merritts,               MD;  Location: [facility] ORS;  Service: Cardiovascular;                Laterality: N/A; 02/20/2015: CATARACT EXTRACTION W/PHACO; Right     Comment:  Procedure: CATARACT EXTRACTION PHACO AND INTRAOCULAR               LENS PLACEMENT ([place]);  Surgeon: Estill Cotta, MD;                Location: [facility] ORS;  Service: Ophthalmology;  Laterality:  Right;  US01:31.2 AP 25.3% CDE40.13 Fluid lot #               [id number] 10/09/2015: ELECTROPHYSIOLOGIC STUDY; N/A     Comment:  Procedure: CARDIOVERSION;  Surgeon: Wellington Hampshire,               MD;  Location: [facility] ORS;  Service: Cardiovascular;                Laterality: N/A; 04/01/2016: IR GENERIC HISTORICAL     Comment:  IR RADIOLOGIST EVAL & MGMT 04/01/2016 [facility]INTER[facility] RAD 04/15/2016: IR GENERIC HISTORICAL     Comment:  IR VERTEBROPLASTY CERV/THOR BX INC UNI/BIL               INC/INJECT/IMAGING 04/15/2016 Luanne Bras, MD               [facility] RAD 04/15/2016: IR GENERIC HISTORICAL     Comment:  IR VERTEBROPLASTY EA ADDL (T&LS) BX INC UNI/BIL  INC               INJECT/IMAGING 04/15/2016 Luanne Bras, MD [facility]INTERV              RAD 04/15/2016: IR GENERIC HISTORICAL     Comment:  IR VERTEBROPLASTY EA ADDL (T&LS) BX INC UNI/BIL INC               INJECT/IMAGING 04/15/2016 Luanne Bras, MD [facility]INTERV              RAD 05/20/2016: IR GENERIC HISTORICAL     Comment:  IR RADIOLOGIST EVAL & MGMT 05/20/2016 [facility]INTER[facility] RAD 06/13/2016: IR GENERIC HISTORICAL     Comment:  IR VERTEBROPLASTY EA ADDL (T&LS) BX INC UNI/BIL INC               INJECT/IMAGING 06/13/2016 Luanne Bras, MD [facility]INTERV              RAD 06/13/2016: IR GENERIC HISTORICAL     Comment:  IR SACROPLASTY BILATERAL 06/13/2016 Luanne Bras,               MD [facility] RAD 06/13/2016: IR GENERIC HISTORICAL     Comment:  IR VERTEBROPLASTY LUMBAR BX INC UNI/BIL               INC/INJECT/IMAGING 06/13/2016 Luanne Bras, MD               [facility]INTER[facility] RAD 07/05/2015: IRRIGATION AND DEBRIDEMENT HEMATOMA; Left     Comment:  Procedure: IRRIGATION AND DEBRIDEMENT HEMATOMA;                Surgeon: Robert Bellow, MD;  Location: [facility] ORS;                Service: General;  Laterality: Left; 11/11/2018: IRRIGATION AND DEBRIDEMENT HEMATOMA; Left     Comment:  Procedure: IRRIGATION AND DEBRIDEMENT LEFT LEG HEMATOMA;              Surgeon: Robert Bellow, MD;  Location: [facility] ORS;                Service: General;  Laterality: Left; 07/1937: pyloric stenosis No date: US ECHOCARDIOGRAPHY  BMI    Body Mass Index:  28.09 kg/m      Reproductive/Obstetrics negative OB ROS                             Anesthesia Physical  Anesthesia Plan  ASA: IV  Anesthesia Plan: General  Post-op Pain Management:    Induction:   PONV Risk Score and Plan: TIVA  Airway Management Planned: Simple Face Mask and Natural Airway  Additional Equipment:   Intra-op Plan:   Post-operative Plan:   Informed Consent: I have reviewed the patients History and  Physical, chart, labs and discussed the procedure including the risks, benefits and alternatives for the proposed anesthesia with the patient or authorized representative who has indicated his/her understanding and acceptance.     Dental Advisory Given  Plan Discussed with: Anesthesiologist and CRNA  Anesthesia Plan Comments: (Dexmedetomidine infusion, supplement with low dose propofol infusion as needed)        Anesthesia Quick Evaluation

## 2018-11-16 NOTE — Progress Notes (Signed)
During rounds patient found awake and attempting to get to the side of the bed. Bed alarm engaged. Patient states " I don't know where I am and I need to go to the bathroom." Patient reoriented that she was in the hospital and had an external catheter in place. Patient agreeable and was repositioned in bed for comfort. Patient resting in bed with eyes closed when this RN left. Bed alarm engaged.

## 2018-11-16 NOTE — Progress Notes (Signed)
PT Cancellation Note  Patient Details Name: GWENETTE WELLONS MRN: [id number] DOB: 1935-05-12   Cancelled Treatment:    Reason Eval/Treat Not Completed: Patient not medically ready;Other (comment)(Pt more lethargic today, also recieving transfusion at this time. Pt will follow up as able. )   Lieutenant Diego PT, DPT 3:22 PM,11/16/18 [phone]

## 2018-11-16 NOTE — Progress Notes (Signed)
Afebrile. Wide awake all weekend by daughter's report.  Sleeping soundly this AM, afternoon and now. Complaints of left foot pain have resolved.  Ace wrap removed. Small amount of old, dry bloody drainage noted. Scant odor. Culture from Nov 13, 2018 showed Enterobacter cloacae.  U/A from today: + nitrites, + leukocyte esterase. HGB down at 7.1 this AM. Not from wound, although a new, ecchymotic area is evident of the left lateral hip area. Great general decline since exam on Friday last week. Possibly secondary to UTI, less likely secondary wound infection with biologic graft. Dressing change today cancelled secondary to planned transfusion and mental status changes. Will re-assess in AM. Long discussion with daughter, [name], that if mental status changes don't rapidly reverse with antibiotics we may be in for a pronounced decline in function.

## 2018-11-16 NOTE — Progress Notes (Signed)
[facility] [place] NAME: [name]    MR#:  [id number]  DATE OF BIRTH:  May 31, 1935  SUBJECTIVE:  CHIEF COMPLAINT:   Chief Complaint  Patient presents with  . Leg Swelling   Patient is very sleepy today but arousable and answering questions.  Daughter [name] at bedside, reports patient did not sleep for the past 3 days properly   REVIEW OF SYSTEMS:  Review of Systems  Constitutional: Negative for chills and fever.  HENT: Negative for hearing loss and tinnitus.   Eyes: Negative for blurred vision and double vision.  Respiratory: Negative for cough and shortness of breath.   Cardiovascular: Negative for chest pain and palpitations.  Gastrointestinal: Negative for abdominal pain, heartburn, nausea and vomiting.  Genitourinary: Negative for dysuria and urgency.  Musculoskeletal: Negative for back pain and myalgias.  Skin: Negative for itching and rash.  Neurological: Negative for dizziness and headaches.  Psychiatric/Behavioral: Negative for depression and hallucinations.    DRUG ALLERGIES:   Allergies  Allergen Reactions  . Ace Inhibitors     Cough  . Doxycycline Nausea Only  . Hydrocodone Itching and Nausea Only  . Mercury     Other reaction(s): Unknown  . Silver Dermatitis    Severe itching  . Cephalexin Rash  . Clindamycin/Lincomycin Rash  . Nickel Rash  . Penicillins Rash    Has patient had a PCN reaction causing immediate rash, facial/tongue/throat swelling, SOB or lightheadedness with hypotension: YES Has patient had a PCN reaction causing severe rash involving mucus membranes or skin necrosis: NO Has patient had a PCN reaction that required hospitalization NO Has patient had a PCN reaction occurring within the last 10 years: NO If all of the above answers are "NO", then may proceed with Cephalosporin use.   VITALS:  Blood pressure (!) 101/52, pulse 60, temperature 98.3 F (36.8 C), temperature source Oral,  resp. rate (!) 22, height 4\' 11"  (1.499 m), weight 62.1 kg, SpO2 95 %. PHYSICAL EXAMINATION:   GENERAL:  84 y.o.-year-old elderly patient lying in the bed with no acute distress.  EYES: Pupils equal, round, reactive to light and accommodation. No scleral icterus. Extraocular muscles intact.  HEENT: Head atraumatic, normocephalic. Oropharynx and nasopharynx clear.  NECK:  Supple, no jugular venous distention. No thyroid enlargement, no tenderness.  LUNGS: Normal breath sounds bilaterally, no wheezing, rhonchi or crepitation.  Decreased bibasilar breath sounds. no use of accessory muscles of respiration.  CARDIOVASCULAR: S1, S2 normal. No  rubs, or gallops.  ABDOMEN: Soft, nontender, nondistended. Bowel sounds present.  EXTREMITIES: No pedal edema, cyanosis, or clubbing.  Left leg in Ace wrap dressing.  Bruising noticed no evidence of any bleeding clinically.  Good capillary refill on left toes. NEUROLOGIC: Cranial nerves II through XII are intact. Muscle strength 5/5 in all extremities. Sensation intact. Gait not checked.  Global weakness noted. PSYCHIATRIC: The patient is alert and oriented x 3.  SKIN: No obvious rash, lesion, or ulcer.   LABORATORY PANEL:  Female CBC Recent Labs  Lab 11/16/18 0608  WBC 4.5  HGB 7.1*  HCT 23.5*  PLT 132*   ------------------------------------------------------------------------------------------------------------------ Chemistries  Recent Labs  Lab 11/16/18 0608  NA 137  K 3.7  CL 98  CO2 29  GLUCOSE 88  BUN 69*  CREATININE 1.70*  CALCIUM 8.2*  MG 2.4   RADIOLOGY:  No results found. ASSESSMENT AND PLAN:   84 year old female with past medical history significant for mitral valve replacement with a  mechanical valve on chronic Coumadin, atrial fibrillation/flutter status post pacemaker, hypertension, chronic systolic heart failure, lymphedema, GERD and hypertension, pulmonary hypertension on chronic 2 L home oxygen brought from home secondary  to left lower extremity hematoma.  #.  Left lower extremity hematoma-after a trauma while on Coumadin. -Appreciate surgical consult.  Patient had debridement of the left calf hematoma and a biological graft placed in on 11/11/2018. Patient was taken to the OR for dressing change on 11/13/2018.  Due to patient complaining of dressing being tight,  notified surgeon on call on 11/15/2018 who will examine and make dressing last night.  Patient has good capillary refill.  Discussed with Dr. Tollie Pizza on 11/16/2018, procedure rescheduled for 11/18/2018 in view of anemia  #Delirium-from wound culture with gram-negative rods/ pain medications/sundowning We will get blood cultures if the patient is febrile for temperature greater than 99 as patient's body temperature usually runs low at 97 according to the daughter Urine culture negative Patient is started on aztreonam is allergic to penicillin and cephalosporins Minimize giving sedatives  # Acute blood loss anemia-secondary to bleeding into her hematoma -Received 2 units packed RBC transfusion this admission.  Hemoglobin 7.1 on 11/16/2018. transfuse 1 more unit of blood  hemoglobin at 7.1 -Continue to monitor hemoglobin closely.    Check in a.m.   # Acute on chronic systolic heart failure exacerbation- Clinically improved.  Denies any shortness of breath. Remains on chronic 2 L oxygen. -Appreciate cardiology input.  Patient has a pacemaker, atrial flutter/fibrillation status post DC cardioversion last week. Patient already transitioned to torsemide 40 mg p.o. daily.  No ACE inhibitor due to underlying CKD and relative hypotension.Last echo with EF of 25%  4.  History of mechanical mitral valve surgery-Coumadin has been restarted INR 2.3 discontinue heparin drip, as hemoglobin dropped down to 7.1 we will continue Coumadin and monitor PT/INR -Cardiology recommended goal INR around 2.5 given her increased risk of bleeding  5.  CKD stage IV-Baseline  creatinine seems to be around 2.3. Renal function remains fairly stable at this time with creatinine of 1.7  I have updated patient's daughter healthcare power of attorney [name] and updated her on treatment plans as outlined .Marland Kitchen  All questions were answered to her satisfaction  All the records are reviewed and case discussed with Care Management/Social Worker. Management plans discussed with the patient, daughter at bedside and daughter is in agreement.  CODE STATUS: Full Code  TOTAL TIME TAKING CARE OF THIS PATIENT: 42 minutes.   More than 50% of the time was spent in counseling/coordination of care: YES  POSSIBLE D/C IN 3 DAYS, DEPENDING ON CLINICAL CONDITION.   Nicholes Mango M.D on 11/16/2018 at 3:20 PM  Between 7am to 6pm - Pager - [phone]  After 6pm go to [email or web address] - Proofreader  [facility]  Office  [phone]  CC: Primary care physician; Rusty Aus, MD  Note: This dictation was prepared with Dragon dictation along with smaller phrase technology. Any transcriptional errors that result from this process are unintentional.

## 2018-11-17 ENCOUNTER — Encounter
Admission: EM | Disposition: A | Discharge status: Skilled Nursing Facility | Payer: Self-pay | Source: Home / Self Care | Attending: Internal Medicine

## 2018-11-17 ENCOUNTER — Encounter: Payer: Self-pay | Admitting: *Deleted

## 2018-11-17 DIAGNOSIS — R0789 Other chest pain: Secondary | ICD-10-CM

## 2018-11-17 DIAGNOSIS — I471 Supraventricular tachycardia: Secondary | ICD-10-CM

## 2018-11-17 DIAGNOSIS — N184 Chronic kidney disease, stage 4 (severe): Secondary | ICD-10-CM

## 2018-11-17 DIAGNOSIS — R079 Chest pain, unspecified: Secondary | ICD-10-CM

## 2018-11-17 DIAGNOSIS — I4819 Other persistent atrial fibrillation: Secondary | ICD-10-CM

## 2018-11-17 DIAGNOSIS — T148XXA Other injury of unspecified body region, initial encounter: Secondary | ICD-10-CM

## 2018-11-17 DIAGNOSIS — I4719 Other supraventricular tachycardia: Secondary | ICD-10-CM

## 2018-11-17 DIAGNOSIS — I5023 Acute on chronic systolic (congestive) heart failure: Secondary | ICD-10-CM

## 2018-11-17 DIAGNOSIS — S81812D Laceration without foreign body, left lower leg, subsequent encounter: Secondary | ICD-10-CM

## 2018-11-17 HISTORY — PX: DRESSING CHANGE UNDER ANESTHESIA: SHX5237

## 2018-11-17 LAB — CBC WITH DIFFERENTIAL/PLATELET
Abs Immature Granulocytes: 0.02 10*3/uL (ref 0.00–0.07)
Basophils Absolute: 0 10*3/uL (ref 0.0–0.1)
Basophils Relative: 1 %
Eosinophils Absolute: 0.1 10*3/uL (ref 0.0–0.5)
Eosinophils Relative: 2 %
HCT: 27.8 % — ABNORMAL LOW (ref 36.0–46.0)
Hemoglobin: 8.7 g/dL — ABNORMAL LOW (ref 12.0–15.0)
Immature Granulocytes: 0 %
Lymphocytes Relative: 7 %
Lymphs Abs: 0.4 10*3/uL — ABNORMAL LOW (ref 0.7–4.0)
MCH: 28.3 pg (ref 26.0–34.0)
MCHC: 31.3 g/dL (ref 30.0–36.0)
MCV: 90.6 fL (ref 80.0–100.0)
Monocytes Absolute: 0.8 10*3/uL (ref 0.1–1.0)
Monocytes Relative: 17 %
Neutro Abs: 3.7 10*3/uL (ref 1.7–7.7)
Neutrophils Relative %: 73 %
Platelets: 140 10*3/uL — ABNORMAL LOW (ref 150–400)
RBC: 3.07 MIL/uL — ABNORMAL LOW (ref 3.87–5.11)
RDW: 16.6 % — ABNORMAL HIGH (ref 11.5–15.5)
WBC: 5 10*3/uL (ref 4.0–10.5)
nRBC: 0 % (ref 0.0–0.2)

## 2018-11-17 LAB — BPAM RBC
Blood Product Expiration Date: 202005302359
ISSUE DATE / TIME: 202005181453
Unit Type and Rh: 600

## 2018-11-17 LAB — PROTIME-INR
INR: 2 — ABNORMAL HIGH (ref 0.8–1.2)
Prothrombin Time: 22.8 seconds — ABNORMAL HIGH (ref 11.4–15.2)

## 2018-11-17 LAB — TYPE AND SCREEN
ABO/RH(D): A POS
Antibody Screen: NEGATIVE
Unit division: 0

## 2018-11-17 LAB — BASIC METABOLIC PANEL
Anion gap: 6 (ref 5–15)
BUN: 51 mg/dL — ABNORMAL HIGH (ref 8–23)
CO2: 29 mmol/L (ref 22–32)
Calcium: 8.5 mg/dL — ABNORMAL LOW (ref 8.9–10.3)
Chloride: 103 mmol/L (ref 98–111)
Creatinine, Ser: 1.43 mg/dL — ABNORMAL HIGH (ref 0.44–1.00)
GFR calc Af Amer: 40 mL/min — ABNORMAL LOW (ref >60)
GFR calc non Af Amer: 34 mL/min — ABNORMAL LOW (ref >60)
Glucose, Bld: 87 mg/dL (ref 70–99)
Potassium: 3.8 mmol/L (ref 3.5–5.1)
Sodium: 138 mmol/L (ref 135–145)

## 2018-11-17 SURGERY — REPLACEMENT, DRESSING, WITH ANESTHESIA
Anesthesia: Choice | Laterality: Left

## 2018-11-17 MED ORDER — TRAMADOL HCL 50 MG PO TABS
50.0000 mg | ORAL_TABLET | ORAL | Status: DC | PRN
  Filled 2018-11-17: qty 1

## 2018-11-17 MED ORDER — FENTANYL CITRATE (PF) 100 MCG/2ML IJ SOLN: 25.0000 ug | INTRAMUSCULAR | Status: DC | PRN

## 2018-11-17 MED ORDER — OXYCODONE HCL 5 MG PO TABS
2.5000 mg | ORAL_TABLET | ORAL | Status: DC | PRN
  Administered 2018-11-17 – 2018-11-24 (×9): 2.5 mg via ORAL
  Filled 2018-11-17 (×10): qty 1

## 2018-11-17 MED ORDER — BUPIVACAINE HCL (PF) 0.5 % IJ SOLN
INTRAMUSCULAR | Status: AC
  Filled 2018-11-17: qty 30

## 2018-11-17 MED ORDER — ALBUTEROL SULFATE (2.5 MG/3ML) 0.083% IN NEBU
2.5000 mg | INHALATION_SOLUTION | Freq: Four times a day (QID) | RESPIRATORY_TRACT | Status: DC | PRN
  Administered 2018-11-19: 09:00:00 2.5 mg via RESPIRATORY_TRACT
  Filled 2018-11-17 (×3): qty 3

## 2018-11-17 MED ORDER — OXYCODONE HCL 5 MG/5ML PO SOLN
2.0000 mg | ORAL | Status: DC | PRN
  Filled 2018-11-17: qty 5

## 2018-11-17 MED ORDER — WARFARIN SODIUM 6 MG PO TABS
6.0000 mg | ORAL_TABLET | Freq: Once | ORAL | Status: AC
  Administered 2018-11-17: 6 mg via ORAL
  Filled 2018-11-17: qty 1

## 2018-11-17 MED ORDER — ONDANSETRON HCL 4 MG/2ML IJ SOLN: 4.0000 mg | Freq: Once | INTRAMUSCULAR | Status: DC | PRN

## 2018-11-17 SURGICAL SUPPLY — 38 items
BANDAGE ELASTIC 4 LF NS (GAUZE/BANDAGES/DRESSINGS) IMPLANT
BANDAGE ELASTIC 6 LF NS (GAUZE/BANDAGES/DRESSINGS) IMPLANT
BNDG COHESIVE 4X5 TAN STRL (GAUZE/BANDAGES/DRESSINGS) ×4 IMPLANT
BNDG GAUZE 4.5X4.1 6PLY STRL (MISCELLANEOUS) ×2 IMPLANT
CANISTER SUCT 1200ML W/VALVE (MISCELLANEOUS) ×3 IMPLANT
CHLORAPREP W/TINT 26 (MISCELLANEOUS) ×3 IMPLANT
CLOSURE WOUND 1/2 X4 (GAUZE/BANDAGES/DRESSINGS)
COVER WAND RF STERILE (DRAPES) ×1 IMPLANT
DRAPE LAPAROTOMY 100X77 ABD (DRAPES) ×3 IMPLANT
DRAPE SHEET LG 3/4 BI-LAMINATE (DRAPES) ×3 IMPLANT
DRSG TEGADERM 4X4.75 (GAUZE/BANDAGES/DRESSINGS) IMPLANT
DRSG TELFA 4X3 1S NADH ST (GAUZE/BANDAGES/DRESSINGS) IMPLANT
ELECT REM PT RETURN 9FT ADLT (ELECTROSURGICAL) ×3
ELECTRODE REM PT RTRN 9FT ADLT (ELECTROSURGICAL) ×1 IMPLANT
GAUZE SPONGE 4X4 12PLY STRL (GAUZE/BANDAGES/DRESSINGS) IMPLANT
GAUZE XEROFORM 5X9 LF (GAUZE/BANDAGES/DRESSINGS) ×4 IMPLANT
GLOVE BIO SURGEON STRL SZ7.5 (GLOVE) ×3 IMPLANT
GLOVE INDICATOR 8.0 STRL GRN (GLOVE) ×3 IMPLANT
GOWN STRL REUS W/ TWL LRG LVL3 (GOWN DISPOSABLE) ×2 IMPLANT
GOWN STRL REUS W/TWL LRG LVL3 (GOWN DISPOSABLE) ×4
KIT TURNOVER KIT A (KITS) ×3 IMPLANT
NS IRRIG 500ML POUR BTL (IV SOLUTION) ×3 IMPLANT
PACK BASIN MINOR ARMC (MISCELLANEOUS) ×3 IMPLANT
PAD ABD DERMACEA PRESS 5X9 (GAUZE/BANDAGES/DRESSINGS) ×2 IMPLANT
PAD PREP 24X41 OB/GYN DISP (PERSONAL CARE ITEMS) ×3 IMPLANT
STOCKINETTE STRL 6IN 960660 (GAUZE/BANDAGES/DRESSINGS) IMPLANT
STRIP CLOSURE SKIN 1/2X4 (GAUZE/BANDAGES/DRESSINGS) IMPLANT
SUT ETHILON 4-0 (SUTURE) ×2
SUT ETHILON 4-0 FS2 18XMFL BLK (SUTURE) ×1
SUT VIC AB 2-0 CT1 (SUTURE) ×3 IMPLANT
SUT VIC AB 3-0 SH 27 (SUTURE) ×2
SUT VIC AB 3-0 SH 27X BRD (SUTURE) ×1 IMPLANT
SUT VIC AB 4-0 FS2 27 (SUTURE) ×3 IMPLANT
SUT VICRYL+ 3-0 144IN (SUTURE) ×3 IMPLANT
SUTURE ETHLN 4-0 FS2 18XMF BLK (SUTURE) ×1 IMPLANT
SWAB DUAL CULTURE TRANS RED ST (MISCELLANEOUS) IMPLANT
SWABSTK COMLB BENZOIN TINCTURE (MISCELLANEOUS) IMPLANT
SYR BULB IRRIG 60ML STRL (SYRINGE) ×2 IMPLANT

## 2018-11-17 NOTE — Op Note (Signed)
Preoperative diagnosis: Wound left lower extremity.  Postoperative diagnosis: Same.  Operative procedure: Dressing change.  Operating Surgeon: Hervey Ard, MD.  Anesthesia: None.  Blood loss: None.  Clinical note: This 84 year old woman suffered a hematoma involving the left lateral calf 8 days ago.  She underwent debridement and placement of a biologic graft  She is return to the operating room for planned dressing change.  Operative note: The patient was maintained on her bed.  The old dressing was removed.  Underneath the Mepitel it was found that all of the biologic graft material had dissolved.  Majority of the previously placed sutures were removed.  The exudate was carefully swept away and the area irrigated with saline.  Xeroform gauze was placed over the wounds followed by ABD pad and a Kerlix wrap.  The patient tolerated the procedure well and was returned to her room in stable condition.

## 2018-11-17 NOTE — Progress Notes (Signed)
[facility] NAME: [name]    MR#:  [id number]  DATE OF BIRTH:  01/24/1935  SUBJECTIVE:  CHIEF COMPLAINT:   Chief Complaint  Patient presents with  . Leg Swelling   Patient is more alert today and Dr. Tollie Pizza proceeded with dressing change today  daughter [name] at bedside  REVIEW OF SYSTEMS:  Review of Systems  Constitutional: Negative for chills and fever.  HENT: Negative for hearing loss and tinnitus.   Eyes: Negative for blurred vision and double vision.  Respiratory: Negative for cough and shortness of breath.   Cardiovascular: Negative for chest pain and palpitations.  Gastrointestinal: Negative for abdominal pain, heartburn, nausea and vomiting.  Genitourinary: Negative for dysuria and urgency.  Musculoskeletal: Negative for back pain and myalgias.  Skin: Negative for itching and rash.  Neurological: Negative for dizziness and headaches.  Psychiatric/Behavioral: Negative for depression and hallucinations.    DRUG ALLERGIES:   Allergies  Allergen Reactions  . Ace Inhibitors     Cough  . Doxycycline Nausea Only  . Hydrocodone Itching and Nausea Only  . Mercury     Other reaction(s): Unknown  . Silver Dermatitis    Severe itching  . Cephalexin Rash  . Clindamycin/Lincomycin Rash  . Nickel Rash  . Penicillins Rash    Has patient had a PCN reaction causing immediate rash, facial/tongue/throat swelling, SOB or lightheadedness with hypotension: YES Has patient had a PCN reaction causing severe rash involving mucus membranes or skin necrosis: NO Has patient had a PCN reaction that required hospitalization NO Has patient had a PCN reaction occurring within the last 10 years: NO If all of the above answers are "NO", then may proceed with Cephalosporin use.   VITALS:  Blood pressure (!) 115/52, pulse 60, temperature 97.6 F (36.4 C), temperature source Oral, resp. rate (!) 22, height 4\' 11"  (1.499 m),  weight 68.1 kg, SpO2 93 %. PHYSICAL EXAMINATION:   GENERAL:  84 y.o.-year-old elderly patient lying in the bed with no acute distress.  EYES: Pupils equal, round, reactive to light and accommodation. No scleral icterus. Extraocular muscles intact.  HEENT: Head atraumatic, normocephalic. Oropharynx and nasopharynx clear.  NECK:  Supple, no jugular venous distention. No thyroid enlargement, no tenderness.  LUNGS: Normal breath sounds bilaterally, no wheezing, rhonchi or crepitation.  Decreased bibasilar breath sounds. no use of accessory muscles of respiration.  CARDIOVASCULAR: S1, S2 normal. No  rubs, or gallops.  ABDOMEN: Soft, nontender, nondistended. Bowel sounds present.  EXTREMITIES: No pedal edema, cyanosis, or clubbing.  Left leg in Ace wrap dressing.  Bruising noticed no evidence of any bleeding clinically.  Good capillary refill on left toes. NEUROLOGIC: Awake, alert and oriented x2-3.  Sensation intact. Gait not checked.  Global weakness noted. PSYCHIATRIC: The patient is alert and oriented x 2-3.  SKIN: No obvious rash, lesion, or ulcer.   LABORATORY PANEL:  Female CBC Recent Labs  Lab 11/17/18 0530  WBC 5.0  HGB 8.7*  HCT 27.8*  PLT 140*   ------------------------------------------------------------------------------------------------------------------ Chemistries  Recent Labs  Lab 11/16/18 0608 11/17/18 0530  NA 137 138  K 3.7 3.8  CL 98 103  CO2 29 29  GLUCOSE 88 87  BUN 69* 51*  CREATININE 1.70* 1.43*  CALCIUM 8.2* 8.5*  MG 2.4  --    RADIOLOGY:  No results found. ASSESSMENT AND PLAN:   84 year old female with past medical history significant for mitral valve replacement with a mechanical valve on  chronic Coumadin, atrial fibrillation/flutter status post pacemaker, hypertension, chronic systolic heart failure, lymphedema, GERD and hypertension, pulmonary hypertension on chronic 2 L home oxygen brought from home secondary to left lower extremity hematoma.   #.  Left lower extremity hematoma-after a trauma while on Coumadin. -Appreciate surgical consult.  Patient had debridement of the left calf hematoma and a biological graft placed in on 11/11/2018. Patient was taken to the OR for dressing change on 11/13/2018.  Due to patient complaining of dressing being tight,  notified surgeon on call on 11/15/2018 who will examine and make dressing last night.  Patient has good capillary refill.  Discussed with Dr. Tollie Pizza on 11/16/2018, procedure dressing change rescheduled for 11/17/2018   #Delirium-from wound culture with gram-negative rods/ pain medications/sundowning We will get blood cultures if the patient is febrile for temperature greater than 99 as patient's body temperature usually runs low at 97 according to the daughter Urine culture negative Wound culture with Enterobacter, few basilllus species Patient is started on aztreonam is allergic to penicillin and cephalosporins Minimize giving sedatives Clinically improving  # Acute blood loss anemia-secondary to bleeding into her hematoma -Received 2 units packed RBC transfusion this admission.  Hemoglobin 7.1 on 11/16/2018. transfuse 1 more unit of blood  hemoglobin at 7.1 with 1 unit of blood transfusion at 8.7 -Continue to monitor hemoglobin closely.    Check in a.m.   # Acute on chronic systolic heart failure exacerbation- Clinically improved.  Denies any shortness of breath. Remains on chronic 2 L oxygen. -Appreciate cardiology input.  Patient has a pacemaker, atrial flutter/fibrillation status post DC cardioversion last week. Patient already transitioned to torsemide 40 mg p.o. daily.  No ACE inhibitor due to underlying CKD and relative hypotension.Last echo with EF of 25%  4.  History of mechanical mitral valve surgery-Coumadin has been restarted INR 2.0 discontinue heparin drip, as hemoglobin dropped down to 7.1 we will continue Coumadin and monitor PT/INR -Cardiology recommended goal INR  around 2.5 given her increased risk of bleeding  5.  CKD stage IV-Baseline creatinine seems to be around 2.3. Renal function remains fairly stable at this time with creatinine of 1.7-1.4  I have updated patient's daughter healthcare power of attorney [name] and updated her on treatment plans as outlined .Marland Kitchen  All questions were answered to her satisfaction  All the records are reviewed and case discussed with Care Management/Social Worker. Management plans discussed with the patient, daughter at bedside and daughter is in agreement.  CODE STATUS: Full Code  TOTAL TIME TAKING CARE OF THIS PATIENT: 39 minutes.   More than 50% of the time was spent in counseling/coordination of care: YES  POSSIBLE D/C IN 2 DAYS, DEPENDING ON CLINICAL CONDITION.   Nicholes Mango M.D on 11/17/2018 at 2:56 PM  Between 7am to 6pm - Pager - [phone]  After 6pm go to [email or web address] - Proofreader  [facility]  Office  [phone]  CC: Primary care physician; Rusty Aus, MD  Note: This dictation was prepared with Dragon dictation along with smaller phrase technology. Any transcriptional errors that result from this process are unintentional.

## 2018-11-17 NOTE — Progress Notes (Signed)
Spoke to daughter [name]). She stated pt get a rash that covers her entire body when taking keflex and PCNs. Will continue with the aztreonam for now. At discharge can switch to ciprofloxacin. Daughter also stated pt has a allergy to sulfur abx as well.   Thanks,  Eleonore Chiquito, PharmD, BCPS

## 2018-11-17 NOTE — Progress Notes (Signed)
Patient much more alert this AM.  U/A results and wound culture previously reviewed. Evidence of anasarca involving the posterior thighs and sacral area.  Violaceous discoloration left lateral thigh >> right lateral distal thigh/calf.  No swelling in the left foot which is warm and dry. Nurses report the patient is "tired" and not sure she wants to struggle much longer.  Will see if this attitude perseveres as the UTI symptoms resolve.  Plan: Dressing change today.

## 2018-11-17 NOTE — Progress Notes (Signed)
Progress Note  Patient Name: [name] Date of Encounter: 11/17/2018  Primary Cardiologist: Ida Rogue, MD   Subjective   Patient notes chest discomfort all day, which improves with repositioning in bed.  She underwent left calf wound dressing change, which was extremely painful.  Pain adequately controlled at this time.  Mild shortness of breath improved since yesterday.  Patient received 1 unit PRBC yesterday.  Inpatient Medications    Scheduled Meds: . amiodarone  200 mg Oral Daily  . cholecalciferol  2,000 Units Oral Daily  . multivitamin with minerals  1 tablet Oral Daily  . polyethylene glycol  17 g Oral Daily  . sodium chloride flush  3 mL Intravenous Q12H  . torsemide  40 mg Oral Daily  . vitamin C  500 mg Oral Daily  . Warfarin - Pharmacist Dosing Inpatient   Does not apply q1800   Continuous Infusions: . sodium chloride    . sodium chloride 250 mL (11/17/18 0611)  . aztreonam 1 g (11/17/18 1355)  . lactated ringers 50 mL/hr at 11/11/18 0729   PRN Meds: sodium chloride, acetaminophen **OR** acetaminophen, albuterol, fentaNYL (SUBLIMAZE) injection, lactulose, ondansetron **OR** ondansetron (ZOFRAN) IV, oxyCODONE, sodium chloride flush, traMADol, traZODone   Vital Signs    Vitals:   11/17/18 1144 11/17/18 1257 11/17/18 1331 11/17/18 1513  BP: (!) 116/49 (!) 116/49 (!) 115/52 (!) 104/46  Pulse: 60 60 60 60  Resp: (!) 22 (!) 22  19  Temp:   97.6 F (36.4 C)   TempSrc:   Oral   SpO2: 95% 95% 93% 97%  Weight:      Height:        Intake/Output Summary (Last 24 hours) at 11/17/2018 1934 Last data filed at 11/17/2018 1514 Gross per 24 hour  Intake 117.72 ml  Output 850 ml  Net -732.28 ml   Last 3 Weights 11/17/2018 11/16/2018 11/15/2018  Weight (lbs) 150 lb 3.2 oz 136 lb 15.9 oz 138 lb 0.1 oz  Weight (kg) 68.13 kg 62.14 kg 62.6 kg  Some encounter information is confidential and restricted. Go to Review Flowsheets activity to see all data.       Telemetry    AV paced - Personally Reviewed  ECG    No new tracing  Physical Exam   GEN: No acute distress.   Neck: JVP ~8 cm Cardiac: RRR with S1 and S2.  2/6 systolic murmur heard across precordium Respiratory: Mildly diminished breath sounds at both lung bases.  No wheezes or crackles. GI: Soft, nontender, non-distended  MS: Left calf wrapped with clean dressing.  There is 1-2+ pitting dependent edema in both thighs. Neuro:  Nonfocal  Psych: Normal affect   Labs    Chemistry Recent Labs  Lab 11/15/18 0521 11/16/18 0608 11/17/18 0530  NA 137 137 138  K 4.4 3.7 3.8  CL 100 98 103  CO2 28 29 29   GLUCOSE 93 88 87  BUN 71* 69* 51*  CREATININE 1.81* 1.70* 1.43*  CALCIUM 8.6* 8.2* 8.5*  GFRNONAA 26* 28* 34*  GFRAA 30* 32* 40*  ANIONGAP 9 10 6      Hematology Recent Labs  Lab 11/15/18 0521 11/16/18 0608 11/16/18 2022 11/17/18 0530  WBC 6.9 4.5  --  5.0  RBC 2.87* 2.54*  --  3.07*  HGB 8.2* 7.1* 9.1* 8.7*  HCT 26.6* 23.5* 28.9* 27.8*  MCV 92.7 92.5  --  90.6  MCH 28.6 28.0  --  28.3  MCHC 30.8 30.2  --  31.3  RDW 16.6* 16.7*  --  16.6*  PLT 153 132*  --  140*    Cardiac EnzymesNo results for input(s): TROPONINI in the last 168 hours. No results for input(s): TROPIPOC in the last 168 hours.   BNPNo results for input(s): BNP, PROBNP in the last 168 hours.   DDimer No results for input(s): DDIMER in the last 168 hours.   Radiology    No results found.  Cardiac Studies   2D Echo 11/05/2018: 1. The left ventricle has severely reduced systolic function, with an ejection fraction of 25-30%. The cavity size was mildly dilated. Left ventricular diastolic Doppler parameters are consistent with impaired relaxation. Left ventrical global hypokinesis without regional wall motion abnormalities. 2. The right ventricle has mildly reduced systolic function. The cavity was moderately enlarged. There is no increase in right ventricular wall thickness. Right ventricular  systolic pressure is moderately elevated with an estimated pressure of 53 mmHg. 3. Right atrial size was moderately dilated. 4. Left atrial size was moderately dilated. 5. The aortic valve was not well visualized. Moderate calcification of the aortic valve. Aortic valve regurgitation was not assessed by color flow Doppler. Unable to estimate adegree of aortic valve stenosis given severely reduced systolic function. 6. Mechanical mitral valve. Mean gradient 5 mm Hg. 7. Tricuspid valve regurgitation is moderate. 8. Left pleural effusion noted, 6.5 cm 9. Rhythm is atrial tachycardia, rate 105 bpm   Patient Profile     84 y.o. female with history of persistent atrial fibrillation status post cardioversion (most recently Nov 05, 2353), chronic systolic congestive heart failure status post CRT, mechanical mitral valve replacement on chronic Coumadin, mild aortic stenosis,Stage IV chronic kidney disease, COPD on home O2, who was admitted with spontaneous left calf hematoma and acute blood loss anemia in the setting of chronic anticoagulation.  Assessment & Plan    Left lower extremity hematoma: Patient s/p wound redressing in the OR today.  Wound culture from 5/15 has grown enterobacter cloacae.  Continue management per surgery and medicine.  Persistent atrial fibrillation and atrial tachycardia: Patient remains AV paced following cardioversion 1.5 weeks ago.  Continue amiodarone.  Continue warfarin; INR at lower [place] of therapeutic range.  Acute on chronic HFrEF: Breathing improved with stable oxygen requirement.  Dependent edema noted.  Continue torsemide 40 mg daily.  Will need to consider escalation if edema or dyspnea worsen, as patient at risk for fluid overload with PRBC x 3 given during this hospitalization.  History of mechanical mitral valve replacement: INR therapeutic but at lower [place] of goal (2-3).  Continue warfarin for goal INR 2-3 (target reduced due to spontaneous  left calf hematoma).  CKD stage IV: Creatinine trending down.  Continue torsemide 40 mg daily; may need to increase to BID if further evidence of fluid retention.  Avoid nephrotoxic drugs.  Chest pain: Seems to be positional and relieved with repositioning in bed.  EKG would not be of much utility in the setting of AV-pacing.  Continue supportive care; may benefit from working with PT, if okay from a wound standpoint.  No plans for ischemia evaluation at this time.  Maintain hemoglobin > 8.  For questions or updates, please contact [place] Please consult [email or web address] for contact info under [facility] Cardiology.     Signed, Nelva Bush, MD  11/17/2018, 7:34 PM

## 2018-11-17 NOTE — Progress Notes (Addendum)
[place] for  Warfarin  Indication: atrial fibrillation  Allergies  Allergen Reactions  . Ace Inhibitors     Cough  . Doxycycline Nausea Only  . Hydrocodone Itching and Nausea Only  . Mercury     Other reaction(s): Unknown  . Silver Dermatitis    Severe itching  . Cephalexin Rash  . Clindamycin/Lincomycin Rash  . Nickel Rash  . Penicillins Rash    Has patient had a PCN reaction causing immediate rash, facial/tongue/throat swelling, SOB or lightheadedness with hypotension: YES Has patient had a PCN reaction causing severe rash involving mucus membranes or skin necrosis: NO Has patient had a PCN reaction that required hospitalization NO Has patient had a PCN reaction occurring within the last 10 years: NO If all of the above answers are "NO", then may proceed with Cephalosporin use.    Patient Measurements: Height: 4\' 11"  (149.9 cm) Weight: 150 lb 3.2 oz (68.1 kg) IBW/kg (Calculated) : 43.2 Heparin Dosing Weight: 62.6 kg  Vital Signs: Temp: 97.8 F (36.6 C) (05/19 0848) Temp Source: Oral (05/19 0848) BP: 127/67 (05/19 0848) Pulse Rate: 70 (05/19 0848)  Labs: Recent Labs    11/15/18 0521 11/15/18 1533 11/16/18 0008 11/16/18 0608 11/16/18 2022 11/17/18 0530  HGB 8.2*  --   --  7.1* 9.1* 8.7*  HCT 26.6*  --   --  23.5* 28.9* 27.8*  PLT 153  --   --  132*  --  140*  LABPROT 21.7*  --   --  24.8*  --  22.8*  INR 1.9*  --   --  2.3*  --  2.0*  HEPARINUNFRC 0.20* 0.32 0.33  --   --   --   CREATININE 1.81*  --   --  1.70*  --  1.43*    Estimated Creatinine Clearance: 25.9 mL/min (A) (by C-G formula based on SCr of 1.43 mg/dL (H)).   Medical History: Past Medical History:  Diagnosis Date  . Arthritis    "back, hands, knees" (04/10/2016)  . Chronic atrial fibrillation    a. On Coumadin  . Chronic back pain   . 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 EF 25% by echo in  08/2015 b. RHC in 08/2015 showed normal filling pressures  . Chronic kidney disease   . Compression fracture    "several; all in my back" (04/10/2016)  . GERD (gastroesophageal reflux disease)   . Hypertension   . Lymphedema   . Pacemaker 05/2017  . S/P MVR (mitral valve replacement)    a. MVR 1994 b. redo MVR in 08/2014 - on Coumadin  . Shortness of breath dyspnea      Assessment: Pharmacy was consulted to start heparin bridge with warfarin. Pt was on warfarin previously for atrial fibrillation with mechanical mitral valve. Per coumadin clinic notes, patient's INR goal is 2.5 - 3.5. Her home evening dose of warfarin is: 6 mg MWF and 3 mg TThSaSun. On admission patient's INR was 3.5 and patient had a hematoma in her leg. Pt went for I&D on 5/13. Vitamin K was given 5/12 and INR has came down to 1.2 for procedure.  Drug interactions: Amiodarone which can increase INR.   Date INR Warfarin Dose  5/11 3.2 Held  5/12 2.8 Held and gave Vitamin K IV  5/13 1.2 6 mg  5/14 1.2 6 mg  5/15 1.3 6 mg  5/16 1.6 6 mg   5/17 1.9 --  5/18 2.3 --  5/19 2.0 6 mg ordered     Goal of Therapy:  INR 2-3, given recent bleed (per cardiology). Monitor platelets by anticoagulation protocol: Yes   Plan:  Warfarin Treatment:  INR therapeutic at 2.3 - Major DDI with amiodarone. Dose not given yesterday due to pt being lethargic. Will order warfarin 6 mg for tonight.  Pt needed higher warfarin doses on Thursday and Saturday to overcome the vitamin K given on 5/12. Pharmacy will continue to follow and monitor. If INR falls below 2 may need to restart heparin gtt.    Oswald Hillock, PharmD, BCPS Clinical Pharmacist 11/17/2018 10:59 AM

## 2018-11-17 NOTE — Plan of Care (Signed)
Pt still receiving IV abx, pt still very lethargic over night but did wake and talk to this RN a couple times. Pt just said she was tired.  Problem: Coping: Goal: Level of anxiety will decrease Outcome: Progressing   Problem: Elimination: Goal: Will not experience complications related to urinary retention Outcome: Progressing   Problem: Pain Managment: Goal: General experience of comfort will improve Outcome: Progressing Note:  Complained of left foot pain once, treated with tylenol which gave relief   Problem: Safety: Goal: Ability to remain free from injury will improve Outcome: Progressing

## 2018-11-18 ENCOUNTER — Encounter: Payer: Self-pay | Admitting: General Surgery

## 2018-11-18 LAB — CBC WITH DIFFERENTIAL/PLATELET
Abs Immature Granulocytes: 0.03 10*3/uL (ref 0.00–0.07)
Basophils Absolute: 0 10*3/uL (ref 0.0–0.1)
Basophils Relative: 0 %
Eosinophils Absolute: 0.2 10*3/uL (ref 0.0–0.5)
Eosinophils Relative: 3 %
HCT: 30.4 % — ABNORMAL LOW (ref 36.0–46.0)
Hemoglobin: 9.2 g/dL — ABNORMAL LOW (ref 12.0–15.0)
Immature Granulocytes: 1 %
Lymphocytes Relative: 6 %
Lymphs Abs: 0.3 10*3/uL — ABNORMAL LOW (ref 0.7–4.0)
MCH: 28.2 pg (ref 26.0–34.0)
MCHC: 30.3 g/dL (ref 30.0–36.0)
MCV: 93.3 fL (ref 80.0–100.0)
Monocytes Absolute: 0.9 10*3/uL (ref 0.1–1.0)
Monocytes Relative: 17 %
Neutro Abs: 3.7 10*3/uL (ref 1.7–7.7)
Neutrophils Relative %: 73 %
Platelets: 153 10*3/uL (ref 150–400)
RBC: 3.26 MIL/uL — ABNORMAL LOW (ref 3.87–5.11)
RDW: 16.6 % — ABNORMAL HIGH (ref 11.5–15.5)
WBC: 5.1 10*3/uL (ref 4.0–10.5)
nRBC: 0 % (ref 0.0–0.2)

## 2018-11-18 LAB — BASIC METABOLIC PANEL
Anion gap: 8 (ref 5–15)
BUN: 43 mg/dL — ABNORMAL HIGH (ref 8–23)
CO2: 29 mmol/L (ref 22–32)
Calcium: 8.5 mg/dL — ABNORMAL LOW (ref 8.9–10.3)
Chloride: 100 mmol/L (ref 98–111)
Creatinine, Ser: 1.36 mg/dL — ABNORMAL HIGH (ref 0.44–1.00)
GFR calc Af Amer: 42 mL/min — ABNORMAL LOW (ref >60)
GFR calc non Af Amer: 36 mL/min — ABNORMAL LOW (ref >60)
Glucose, Bld: 105 mg/dL — ABNORMAL HIGH (ref 70–99)
Potassium: 4.2 mmol/L (ref 3.5–5.1)
Sodium: 137 mmol/L (ref 135–145)

## 2018-11-18 LAB — PROTIME-INR
INR: 2.4 — ABNORMAL HIGH (ref 0.8–1.2)
Prothrombin Time: 25.4 seconds — ABNORMAL HIGH (ref 11.4–15.2)

## 2018-11-18 LAB — ALBUMIN: Albumin: 2.9 g/dL — ABNORMAL LOW (ref 3.5–5.0)

## 2018-11-18 NOTE — Progress Notes (Signed)
[facility] NAME: [name]    MR#:  [id number]  DATE OF BIRTH:  02-13-1935  SUBJECTIVE:  CHIEF COMPLAINT:   Chief Complaint  Patient presents with  . Leg Swelling   Patient is more alert and awake today   daughter [name] at bedside  REVIEW OF SYSTEMS:  Review of Systems  Constitutional: Negative for chills and fever.  HENT: Negative for hearing loss and tinnitus.   Eyes: Negative for blurred vision and double vision.  Respiratory: Negative for cough and shortness of breath.   Cardiovascular: Negative for chest pain and palpitations.  Gastrointestinal: Negative for abdominal pain, heartburn, nausea and vomiting.  Genitourinary: Negative for dysuria and urgency.  Musculoskeletal: Negative for back pain and myalgias.  Skin: Negative for itching and rash.  Neurological: Negative for dizziness and headaches.  Psychiatric/Behavioral: Negative for depression and hallucinations.    DRUG ALLERGIES:   Allergies  Allergen Reactions  . Ace Inhibitors     Cough  . Doxycycline Nausea Only  . Hydrocodone Itching and Nausea Only  . Mercury     Other reaction(s): Unknown  . Silver Dermatitis    Severe itching  . Cephalexin Rash  . Clindamycin/Lincomycin Rash  . Nickel Rash  . Penicillins Rash    Has patient had a PCN reaction causing immediate rash, facial/tongue/throat swelling, SOB or lightheadedness with hypotension: YES Has patient had a PCN reaction causing severe rash involving mucus membranes or skin necrosis: NO Has patient had a PCN reaction that required hospitalization NO Has patient had a PCN reaction occurring within the last 10 years: NO If all of the above answers are "NO", then may proceed with Cephalosporin use.   VITALS:  Blood pressure (!) 111/53, pulse 60, temperature 98.4 F (36.9 C), temperature source Oral, resp. rate 20, height 4\' 11"  (1.499 m), weight 71.7 kg, SpO2 97 %. PHYSICAL EXAMINATION:    GENERAL:  84 y.o.-year-old elderly patient lying in the bed with no acute distress.  EYES: Pupils equal, round, reactive to light and accommodation. No scleral icterus. Extraocular muscles intact.  HEENT: Head atraumatic, normocephalic. Oropharynx and nasopharynx clear.  NECK:  Supple, no jugular venous distention. No thyroid enlargement, no tenderness.  LUNGS: Normal breath sounds bilaterally, no wheezing, rhonchi or crepitation.  Decreased bibasilar breath sounds. no use of accessory muscles of respiration.  CARDIOVASCULAR: S1, S2 normal. No  rubs, or gallops.  ABDOMEN: Soft, nontender, nondistended. Bowel sounds present.  EXTREMITIES: No pedal edema, cyanosis, or clubbing.  Left leg in Ace wrap dressing with blood stains.  No active bleeding noticed bruising noticed no evidence of any bleeding clinically.  Good capillary refill on left toes. NEUROLOGIC: Awake, alert and oriented x2-3.  Sensation intact. Gait not checked.  Global weakness noted. PSYCHIATRIC: The patient is alert and oriented x 2-3.  SKIN: No obvious rash, lesion, or ulcer.   LABORATORY PANEL:  Female CBC Recent Labs  Lab 11/18/18 0325  WBC 5.1  HGB 9.2*  HCT 30.4*  PLT 153   ------------------------------------------------------------------------------------------------------------------ Chemistries  Recent Labs  Lab 11/16/18 0608  11/18/18 0325  NA 137   < > 137  K 3.7   < > 4.2  CL 98   < > 100  CO2 29   < > 29  GLUCOSE 88   < > 105*  BUN 69*   < > 43*  CREATININE 1.70*   < > 1.36*  CALCIUM 8.2*   < > 8.5*  MG 2.4  --   --    < > = values in this interval not displayed.   RADIOLOGY:  No results found. ASSESSMENT AND PLAN:   84 year old female with past medical history significant for mitral valve replacement with a mechanical valve on chronic Coumadin, atrial fibrillation/flutter status post pacemaker, hypertension, chronic systolic heart failure, lymphedema, GERD and hypertension, pulmonary  hypertension on chronic 2 L home oxygen brought from home secondary to left lower extremity hematoma.  #.  Left lower extremity hematoma-after a trauma while on Coumadin. -Appreciate surgical consult.  Patient had debridement of the left calf hematoma and a biological graft placed in on 11/11/2018. Patient was taken to the OR for dressing change on 11/13/2018.  Due to patient complaining of dressing being tight,  notified surgeon on call on 11/15/2018 who will examine and make dressing last night.  Patient has good capillary refill.  Discussed with Dr. Tollie Pizza on 11/16/2018, procedure dressing change rescheduled for 11/17/2018  Dr.Byrnett discussed with [name], DO from plastics.  Patient may be candidate for Acell covering.  Planning  for OR dressing change and possible graft application tomorrow.   #Delirium-from wound culture with gram-negative rods/ pain medications/sundowning We will get blood cultures if the patient is febrile for temperature greater than 99 as patient's body temperature usually runs low at 97 according to the daughter Urine culture negative Wound culture with Enterobacter, few basilllus species Patient is started on aztreonam is allergic to penicillin and cephalosporins Minimize giving sedatives Clinically improving  # Acute blood loss anemia-secondary to bleeding into her hematoma -Received 2 units packed RBC transfusion this admission.  Hemoglobin 7.1 on 11/16/2018. transfuse 1 more unit of blood  hemoglobin at 7.1 with 1 unit of blood transfusion at 8.7--9.2 -Continue to monitor hemoglobin closely.    Check in a.m.   # Acute on chronic systolic heart failure exacerbation- Clinically improved.  Denies any shortness of breath. Remains on chronic 2 L oxygen. -Appreciate cardiology input.  Patient has a pacemaker, atrial flutter/fibrillation status post DC cardioversion last week. Patient already transitioned to torsemide 40 mg p.o. daily.  No ACE inhibitor due  to underlying CKD and relative hypotension.Last echo with EF of 25%  4.  History of mechanical mitral valve surgery-Coumadin has been restarted INR 2.4 discontinued heparin drip, as hemoglobin dropped down to 7.1 we will continue Coumadin and monitor PT/INR -Cardiology recommended goal INR around 2.5 given her increased risk of bleeding  5.  CKD stage IV-Baseline creatinine seems to be around 2.3. Renal function remains fairly stable at this time with creatinine of 1.7-1.4-1.36  6.  Generalized weakness physical therapy recommending skilled nursing facility but daughter [name] a prefers home health PT  I have updated patient's daughter healthcare power of attorney [name] and updated her on treatment plans as outlined .Marland Kitchen  All questions were answered to her satisfaction  All the records are reviewed and case discussed with Care Management/Social Worker. Management plans discussed with the patient, daughter at bedside and daughter is in agreement.  CODE STATUS: Full Code  TOTAL TIME TAKING CARE OF THIS PATIENT: 37 minutes.   More than 50% of the time was spent in counseling/coordination of care: YES  POSSIBLE D/C IN 2 DAYS, DEPENDING ON CLINICAL CONDITION.   Nicholes Mango M.D on 11/18/2018 at 2:58 PM  Between 7am to 6pm - Pager - [phone]  After 6pm go to [email or web address] - [employer] [facility]  Office  [phone]  CC: Primary  care physician; Rusty Aus, MD  Note: This dictation was prepared with Dragon dictation along with smaller phrase technology. Any transcriptional errors that result from this process are unintentional.

## 2018-11-18 NOTE — Plan of Care (Signed)
  Problem: Activity: Goal: Risk for activity intolerance will decrease Outcome: Progressing Note:  Up to [facility] with 3 assist, tolerated okay, unable to move bowels.   Problem: Nutrition: Goal: Adequate nutrition will be maintained Outcome: Progressing   Problem: Coping: Goal: Level of anxiety will decrease Outcome: Progressing   Problem: Pain Managment: Goal: General experience of comfort will improve Outcome: Progressing Note:  Treated pain twice with oxycodone, which gave pt relief   Problem: Safety: Goal: Ability to remain free from injury will improve Outcome: Progressing

## 2018-11-18 NOTE — Progress Notes (Signed)
This RN to room for rounds, noticed increasing drainage at surgical site. This RN offered to unwrap kerlex and re-wrap the kerlex, both daughter and patient disagreed and insisted that this RN simply add more kerlex to the area, so this RN re-wrapped left lower extremity.

## 2018-11-18 NOTE — Progress Notes (Signed)
PT Cancellation Note  Patient Details Name: [name] MRN: [id number] DOB: 19-Nov-1934   Cancelled Treatment:    Reason Eval/Treat Not Completed: Fatigue/lethargy limiting ability to participate;Other (comment)(PT entered room, pt just finishing transferring from commode, very fatigued, SOB. PT will re-attempt later as able to maximize treatment session.)   Lieutenant Diego PT, DPT 11:13 AM,11/18/18 [phone]

## 2018-11-18 NOTE — Progress Notes (Signed)
Chart reviewed.  Labs: Stable HGB and improving renal function. WBC: normal. Albumin down to 2.9 (3.6 on admission.). Legs: No edema. Modest drainage through Kerlex wrap, scan blood.  Edema of thighs and gluteal area persists. Discussed with [name], DO from plastics.  Patient may be candidate for Acell covering.  Will plan for OR dressing change and possible graft application tomorrow.

## 2018-11-18 NOTE — Progress Notes (Signed)
[place] for  Warfarin  Indication: atrial fibrillation  Allergies  Allergen Reactions  . Ace Inhibitors     Cough  . Doxycycline Nausea Only  . Hydrocodone Itching and Nausea Only  . Mercury     Other reaction(s): Unknown  . Silver Dermatitis    Severe itching  . Cephalexin Rash  . Clindamycin/Lincomycin Rash  . Nickel Rash  . Penicillins Rash    Has patient had a PCN reaction causing immediate rash, facial/tongue/throat swelling, SOB or lightheadedness with hypotension: YES Has patient had a PCN reaction causing severe rash involving mucus membranes or skin necrosis: NO Has patient had a PCN reaction that required hospitalization NO Has patient had a PCN reaction occurring within the last 10 years: NO If all of the above answers are "NO", then may proceed with Cephalosporin use.    Patient Measurements: Height: 4\' 11"  (149.9 cm) Weight: 158 lb 1.6 oz (71.7 kg) IBW/kg (Calculated) : 43.2 Heparin Dosing Weight: 62.6 kg  Vital Signs: Temp: 98.1 F (36.7 C) (05/20 0346) Temp Source: Oral (05/20 0346) BP: 108/55 (05/20 0346) Pulse Rate: 59 (05/20 0346)  Labs: Recent Labs    11/15/18 1533 11/16/18 0008  11/16/18 0608 11/16/18 2022 11/17/18 0530 11/18/18 0325  HGB  --   --    < > 7.1* 9.1* 8.7* 9.2*  HCT  --   --    < > 23.5* 28.9* 27.8* 30.4*  PLT  --   --   --  132*  --  140* 153  LABPROT  --   --   --  24.8*  --  22.8* 25.4*  INR  --   --   --  2.3*  --  2.0* 2.4*  HEPARINUNFRC 0.32 0.33  --   --   --   --   --   CREATININE  --   --   --  1.70*  --  1.43* 1.36*   < > = values in this interval not displayed.    Estimated Creatinine Clearance: 28 mL/min (A) (by C-G formula based on SCr of 1.36 mg/dL (H)).   Medical History: Past Medical History:  Diagnosis Date  . Arthritis    "back, hands, knees" (04/10/2016)  . Chronic atrial fibrillation    a. On Coumadin  . Chronic back pain   . Chronic combined systolic  (congestive) and diastolic (congestive) heart failure (HCC)    a. EF 25% by echo in 08/2015 b. RHC in 08/2015 showed normal filling pressures  . Chronic kidney disease   . Compression fracture    "several; all in my back" (04/10/2016)  . GERD (gastroesophageal reflux disease)   . Hypertension   . Lymphedema   . Pacemaker 05/2017  . S/P MVR (mitral valve replacement)    a. MVR 1994 b. redo MVR in 08/2014 - on Coumadin  . Shortness of breath dyspnea      Assessment: Pharmacy was consulted to start heparin bridge with warfarin. Pt was on warfarin previously for atrial fibrillation with mechanical mitral valve. Per coumadin clinic notes, patient's INR goal is 2.5 - 3.5. Her home evening dose of warfarin is: 6 mg MWF and 3 mg TThSaSun. On admission patient's INR was 3.5 and patient had a hematoma in her leg. Pt went for I&D on 5/13. Vitamin K was given 5/12 and INR has came down to 1.2 for procedure.  Drug interactions: Amiodarone which can increase INR.   Date INR Warfarin Dose  5/11 3.2 Held  5/12 2.8 Held and gave Vitamin K IV  5/13 1.2 6 mg  5/14 1.2 6 mg  5/15 1.3 6 mg  5/16 1.6 6 mg   5/17 1.9 --  5/18 2.3 -- Not given due to pt being lethargic  5/19 2.0 6 mg   5/20 2.4 3 mg    Goal of Therapy:  INR 2-3, given recent bleed (per cardiology). Monitor platelets by anticoagulation protocol: Yes   Plan:   INR therapeutic at 2.4 - Major DDI with amiodarone (home med). Will order warfarin 3 mg for tonight and plan to order 3 mg tomorrow and return to home regimen if INR stabilizes. Pharmacy will continue to follow and monitor. If INR falls below 2 may need to restart heparin gtt.    Oswald Hillock, PharmD, BCPS Clinical Pharmacist 11/18/2018 8:27 AM

## 2018-11-18 NOTE — Progress Notes (Signed)
Patient called out to get to [facility], PT Beverlee Nims contacted and said she would be at bedside in 5 min or less. Patient notified of plan.

## 2018-11-18 NOTE — Progress Notes (Signed)
Physical Therapy Treatment Patient Details Name: [name] MRN: [id number] DOB: 08/05/1934 Today's Date: 11/18/2018    History of Present Illness Pt is an 84 y.o. female with recent hospital discharge 11/08/18 (CHF exacerbation with cardioversion); returned to hospital 11/09/18 with ruptured large L LE hematoma.  Pt admitted with acute blood loss anemia from large L LE hematoma with bleeding (likely secondary anticoagulation therapy for a-fib, MVR, and pacemaker).  S/p debridement of L LE hematoma 11/11/18.  11/13/18 s/p dressing change of L LE wound with application of Neox graft, 5/19 dressing change, and transfusion 5/18.  PMH includes cardioversion 11/06/18, chronic a-fib, chronic back pain, systolic and diastolic CHF, CKD, compression fx back, pacemaker, s/p MVR, pulmonary htn, chronic venous insufficiency, B LE edema, and chronic 3 L O2 at home.    PT Comments    Pt requesting to use BSC. Reported 8/10 pain in LLE. Pt on 3.5L via [place]. Supine <> sit with minA for LLE management and use of bed rails. Sit <> stand x2 this session with minA, RW.  Pt able to take a few steps towards [facility] with education provided about ability to weight bear through LLE per Dr. Bary Castilla. Exhibited decreased weight bearing on LLE, step to gait pattern, antalgic gait and SOB with exertion bumped to 5L and titrated back down to 3.5L as able. Pt returned to bed, all needs in reach in NAD. The patient demonstrated improvement in functional mobility this session and would benefit from continued skilled PT to continue to progress towards goals.    Follow Up Recommendations  SNF     Equipment Recommendations  Wheelchair (measurements PT);Wheelchair cushion (measurements PT);3in1 (PT);Hospital bed;Rolling walker with 5" wheels    Recommendations for Other Services       Precautions / Restrictions Precautions Precautions: Fall Precaution Comments: Per Dr Bary Castilla, pt has no weight bearing  precautions Restrictions Weight Bearing Restrictions: Yes Other Position/Activity Restrictions: Per Dr. Bary Castilla, pt has no weight bearing precautions    Mobility  Bed Mobility Overal bed mobility: Needs Assistance Bed Mobility: Supine to Sit;Sit to Supine     Supine to sit: Min assist Sit to supine: Min assist   General bed mobility comments: minA for LLE management. Heavy use of bed rails  Transfers Overall transfer level: Needs assistance Equipment used: Rolling walker (2 wheeled);None Transfers: Sit to/from Stand Sit to Stand: Min assist         General transfer comment: minA from low BSC. Min for initial standing balance, but able to progress to CGA.  Ambulation/Gait   Gait Distance (Feet): 2 Feet Assistive device: Rolling walker (2 wheeled)       General Gait Details: decreased weight bearing on LLE. step to gait pattern. Antalgic gait. SOB with exertion   Stairs             Wheelchair Mobility    Modified Rankin (Stroke Patients Only)       Balance Overall balance assessment: Needs assistance Sitting-balance support: Feet unsupported Sitting balance-Leahy Scale: Fair       Standing balance-Leahy Scale: Poor Standing balance comment: Pt needed bilateral UE support on RW to maintain balance, occasional minA from PT                            Cognition Arousal/Alertness: Awake/alert Behavior During Therapy: WFL for tasks assessed/performed Overall Cognitive Status: Within Functional Limits for tasks assessed  Exercises      General Comments        Pertinent Vitals/Pain Pain Assessment: 0-10 Pain Score: 8  Pain Location: with standing on LLE and at rest Pain Intervention(s): Limited activity within patient's tolerance;Monitored during session;Repositioned    Home Living                      Prior Function            PT Goals (current goals can now be  found in the care plan section) Progress towards PT goals: Progressing toward goals    Frequency    Min 2X/week      PT Plan Current plan remains appropriate    Co-evaluation              AM-PAC PT "6 Clicks" Mobility   Outcome Measure  Help needed turning from your back to your side while in a flat bed without using bedrails?: A Lot Help needed moving from lying on your back to sitting on the side of a flat bed without using bedrails?: A Lot Help needed moving to and from a bed to a chair (including a wheelchair)?: A Little Help needed standing up from a chair using your arms (e.g., wheelchair or bedside chair)?: A Little Help needed to walk in hospital room?: A Lot Help needed climbing 3-5 steps with a railing? : Total 6 Click Score: 13    End of Session Equipment Utilized During Treatment: Oxygen;Gait belt(3.5-5L) Activity Tolerance: Patient limited by fatigue;Patient limited by pain Patient left: in bed;with call bell/phone within reach;with bed alarm set Nurse Communication: Mobility status PT Visit Diagnosis: Other abnormalities of gait and mobility (R26.89);Muscle weakness (generalized) (M62.81);Difficulty in walking, not elsewhere classified (R26.2);Pain Pain - Right/Left: Left Pain - part of body: Leg     Time: 1340-1405 PT Time Calculation (min) (ACUTE ONLY): 25 min  Charges:  $Therapeutic Exercise: 23-37 mins                     Lieutenant Diego PT, DPT 2:21 PM,11/18/18 [phone]

## 2018-11-18 NOTE — Progress Notes (Signed)
Daughter [name] at bedside, patient signed consent for dressing change and possible skin graft tomorrow, both updated on plan of care. No concerns at this time.

## 2018-11-18 NOTE — Progress Notes (Signed)
Patient refuses PT this morning, states she will try to work with them this afternoon, Dr. Fleet Contras to bedside to assess patient, no dressing change as of yet, Dr. Fleet Contras to consult with plastic prior to dressing change d/t patient lack of dressing change tolerance yesterday in the OR.

## 2018-11-18 NOTE — Plan of Care (Signed)
  Problem: Clinical Measurements: Goal: Ability to maintain clinical measurements within normal limits will improve Outcome: Progressing Hemoglobin stable today   Problem: Activity: Goal: Risk for activity intolerance will decrease Outcome: Progressing Patient able to get to [facility], with encouragement, less complaints of pain today, but not able to maintain Oxygen Sats Problem: Nutrition: Goal: Adequate nutrition will be maintained Outcome: Progressing    Problem: Pain Managment: Goal: General experience of comfort will improve Outcome: Progressing   Problem: Safety: Goal: Ability to remain free from injury will improve Outcome: Progressing

## 2018-11-19 ENCOUNTER — Encounter: Payer: Self-pay | Admitting: Anesthesiology

## 2018-11-19 ENCOUNTER — Encounter
Admission: EM | Disposition: A | Discharge status: Skilled Nursing Facility | Payer: Medicare Other | Source: Home / Self Care | Attending: Internal Medicine

## 2018-11-19 ENCOUNTER — Telehealth (INDEPENDENT_AMBULATORY_CARE_PROVIDER_SITE_OTHER): Payer: Self-pay | Admitting: Vascular Surgery

## 2018-11-19 ENCOUNTER — Telehealth: Payer: Medicare Other | Admitting: Family

## 2018-11-19 ENCOUNTER — Inpatient Hospital Stay: Payer: Medicare Other | Admitting: Anesthesiology

## 2018-11-19 ENCOUNTER — Other Ambulatory Visit: Payer: Self-pay

## 2018-11-19 ENCOUNTER — Telehealth (INDEPENDENT_AMBULATORY_CARE_PROVIDER_SITE_OTHER): Payer: Self-pay

## 2018-11-19 DIAGNOSIS — S81802A Unspecified open wound, left lower leg, initial encounter: Secondary | ICD-10-CM

## 2018-11-19 HISTORY — PX: INCISION AND DRAINAGE OF WOUND: SHX1803

## 2018-11-19 LAB — CBC WITH DIFFERENTIAL/PLATELET
Abs Immature Granulocytes: 0.03 10*3/uL (ref 0.00–0.07)
Basophils Absolute: 0 10*3/uL (ref 0.0–0.1)
Basophils Relative: 1 %
Eosinophils Absolute: 0.1 10*3/uL (ref 0.0–0.5)
Eosinophils Relative: 3 %
HCT: 29.2 % — ABNORMAL LOW (ref 36.0–46.0)
Hemoglobin: 8.8 g/dL — ABNORMAL LOW (ref 12.0–15.0)
Immature Granulocytes: 1 %
Lymphocytes Relative: 10 %
Lymphs Abs: 0.4 10*3/uL — ABNORMAL LOW (ref 0.7–4.0)
MCH: 28.6 pg (ref 26.0–34.0)
MCHC: 30.1 g/dL (ref 30.0–36.0)
MCV: 94.8 fL (ref 80.0–100.0)
Monocytes Absolute: 0.7 10*3/uL (ref 0.1–1.0)
Monocytes Relative: 17 %
Neutro Abs: 2.9 10*3/uL (ref 1.7–7.7)
Neutrophils Relative %: 68 %
Platelets: 148 10*3/uL — ABNORMAL LOW (ref 150–400)
RBC: 3.08 MIL/uL — ABNORMAL LOW (ref 3.87–5.11)
RDW: 16.8 % — ABNORMAL HIGH (ref 11.5–15.5)
WBC: 4.2 10*3/uL (ref 4.0–10.5)
nRBC: 0 % (ref 0.0–0.2)

## 2018-11-19 LAB — BASIC METABOLIC PANEL
Anion gap: 6 (ref 5–15)
BUN: 38 mg/dL — ABNORMAL HIGH (ref 8–23)
CO2: 30 mmol/L (ref 22–32)
Calcium: 8.5 mg/dL — ABNORMAL LOW (ref 8.9–10.3)
Chloride: 104 mmol/L (ref 98–111)
Creatinine, Ser: 1.22 mg/dL — ABNORMAL HIGH (ref 0.44–1.00)
GFR calc Af Amer: 48 mL/min — ABNORMAL LOW (ref >60)
GFR calc non Af Amer: 42 mL/min — ABNORMAL LOW (ref >60)
Glucose, Bld: 90 mg/dL (ref 70–99)
Potassium: 4 mmol/L (ref 3.5–5.1)
Sodium: 140 mmol/L (ref 135–145)

## 2018-11-19 LAB — PROTIME-INR
INR: 2.1 — ABNORMAL HIGH (ref 0.8–1.2)
Prothrombin Time: 23.3 seconds — ABNORMAL HIGH (ref 11.4–15.2)

## 2018-11-19 SURGERY — IRRIGATION AND DEBRIDEMENT WOUND
Anesthesia: General | Laterality: Left

## 2018-11-19 MED ORDER — PROPOFOL 10 MG/ML IV BOLUS
INTRAVENOUS | Status: AC
  Filled 2018-11-19: qty 20

## 2018-11-19 MED ORDER — LIDOCAINE-EPINEPHRINE (PF) 1 %-1:200000 IJ SOLN
INTRAMUSCULAR | Status: DC | PRN
  Administered 2018-11-19: 10 mL

## 2018-11-19 MED ORDER — WARFARIN SODIUM 5 MG PO TABS
5.0000 mg | ORAL_TABLET | Freq: Once | ORAL | Status: AC
  Administered 2018-11-19: 5 mg via ORAL
  Filled 2018-11-19: qty 1

## 2018-11-19 MED ORDER — BUPIVACAINE HCL (PF) 0.5 % IJ SOLN
INTRAMUSCULAR | Status: AC
  Filled 2018-11-19: qty 30

## 2018-11-19 MED ORDER — VITAMIN C 500 MG PO TABS
250.0000 mg | ORAL_TABLET | Freq: Two times a day (BID) | ORAL | Status: DC
  Administered 2018-11-19 – 2018-11-30 (×22): 250 mg via ORAL
  Filled 2018-11-19 (×19): qty 1
  Filled 2018-11-19: qty 0.5
  Filled 2018-11-19 (×3): qty 1

## 2018-11-19 MED ORDER — CHLORHEXIDINE GLUCONATE CLOTH 2 % EX PADS
6.0000 | MEDICATED_PAD | Freq: Once | CUTANEOUS | Status: AC
  Administered 2018-11-19: 6 via TOPICAL

## 2018-11-19 MED ORDER — FENTANYL CITRATE (PF) 100 MCG/2ML IJ SOLN
INTRAMUSCULAR | Status: AC
  Filled 2018-11-19: qty 2

## 2018-11-19 MED ORDER — PROPOFOL 10 MG/ML IV BOLUS
INTRAVENOUS | Status: DC | PRN
  Administered 2018-11-19: 20 mg via INTRAVENOUS
  Administered 2018-11-19: 30 mg via INTRAVENOUS
  Administered 2018-11-19 (×5): 20 mg via INTRAVENOUS

## 2018-11-19 MED ORDER — RISAQUAD PO CAPS
1.0000 | ORAL_CAPSULE | Freq: Two times a day (BID) | ORAL | Status: DC
  Administered 2018-11-19 – 2018-11-30 (×20): 1 via ORAL
  Filled 2018-11-19 (×21): qty 1

## 2018-11-19 MED ORDER — FENTANYL CITRATE (PF) 100 MCG/2ML IJ SOLN
INTRAMUSCULAR | Status: DC | PRN
  Administered 2018-11-19 (×4): 25 ug via INTRAVENOUS

## 2018-11-19 MED ORDER — PROPOFOL 500 MG/50ML IV EMUL
INTRAVENOUS | Status: DC | PRN
  Administered 2018-11-19: 100 ug/kg/min via INTRAVENOUS

## 2018-11-19 MED ORDER — MIDAZOLAM HCL 2 MG/2ML IJ SOLN
INTRAMUSCULAR | Status: AC
  Filled 2018-11-19: qty 2

## 2018-11-19 MED ORDER — SEVOFLURANE IN SOLN
RESPIRATORY_TRACT | Status: AC
  Filled 2018-11-19: qty 250

## 2018-11-19 MED ORDER — CIPROFLOXACIN HCL 500 MG PO TABS
250.0000 mg | ORAL_TABLET | Freq: Two times a day (BID) | ORAL | Status: AC
  Administered 2018-11-19 – 2018-11-25 (×13): 250 mg via ORAL
  Filled 2018-11-19 (×13): qty 0.5

## 2018-11-19 MED ORDER — FENTANYL CITRATE (PF) 100 MCG/2ML IJ SOLN
25.0000 ug | INTRAMUSCULAR | Status: DC | PRN
  Administered 2018-11-19: 16:00:00 25 ug via INTRAVENOUS

## 2018-11-19 MED ORDER — ENSURE ENLIVE PO LIQD
237.0000 mL | Freq: Two times a day (BID) | ORAL | Status: DC
  Filled 2018-11-19: qty 237

## 2018-11-19 MED ORDER — EPHEDRINE SULFATE 50 MG/ML IJ SOLN
INTRAMUSCULAR | Status: DC | PRN
  Administered 2018-11-19: 10 mg via INTRAVENOUS

## 2018-11-19 MED ORDER — CIPROFLOXACIN IN D5W 400 MG/200ML IV SOLN
400.0000 mg | INTRAVENOUS | Status: DC
  Filled 2018-11-19: qty 200

## 2018-11-19 MED ORDER — LIDOCAINE-EPINEPHRINE (PF) 1 %-1:200000 IJ SOLN
INTRAMUSCULAR | Status: AC
  Filled 2018-11-19: qty 30

## 2018-11-19 MED ORDER — ENSURE ENLIVE PO LIQD
237.0000 mL | Freq: Two times a day (BID) | ORAL | Status: DC
  Administered 2018-11-20: 237 mL via ORAL
  Filled 2018-11-19: qty 237

## 2018-11-19 MED ORDER — DEXMEDETOMIDINE HCL IN NACL 400 MCG/100ML IV SOLN
INTRAVENOUS | Status: DC | PRN
  Administered 2018-11-19: 0.7 ug/kg/h via INTRAVENOUS

## 2018-11-19 MED ORDER — ONDANSETRON HCL 4 MG/2ML IJ SOLN: 4.0000 mg | Freq: Once | INTRAMUSCULAR | Status: DC | PRN

## 2018-11-19 SURGICAL SUPPLY — 44 items
BANDAGE ELASTIC 4 LF NS (GAUZE/BANDAGES/DRESSINGS) IMPLANT
BANDAGE ELASTIC 6 LF NS (GAUZE/BANDAGES/DRESSINGS) IMPLANT
BNDG GAUZE 4.5X4.1 6PLY STRL (MISCELLANEOUS) ×6 IMPLANT
CANISTER SUCT 1200ML W/VALVE (MISCELLANEOUS) ×3 IMPLANT
CHLORAPREP W/TINT 26 (MISCELLANEOUS) ×3 IMPLANT
CLOSURE WOUND 1/2 X4 (GAUZE/BANDAGES/DRESSINGS)
COVER WAND RF STERILE (DRAPES) ×3 IMPLANT
DRAPE LAPAROTOMY 100X77 ABD (DRAPES) ×3 IMPLANT
DRAPE SHEET LG 3/4 BI-LAMINATE (DRAPES) ×3 IMPLANT
DRSG EMULSION OIL 3X8 NADH (GAUZE/BANDAGES/DRESSINGS) ×10 IMPLANT
DRSG TEGADERM 4X4.75 (GAUZE/BANDAGES/DRESSINGS) IMPLANT
DRSG TELFA 3X8 NADH (GAUZE/BANDAGES/DRESSINGS) ×3 IMPLANT
DRSG TELFA 4X3 1S NADH ST (GAUZE/BANDAGES/DRESSINGS) IMPLANT
ELECT REM PT RETURN 9FT ADLT (ELECTROSURGICAL) ×3
ELECTRODE REM PT RTRN 9FT ADLT (ELECTROSURGICAL) ×1 IMPLANT
GAUZE SPONGE 4X4 12PLY STRL (GAUZE/BANDAGES/DRESSINGS) ×4 IMPLANT
GLOVE BIO SURGEON STRL SZ7.5 (GLOVE) ×3 IMPLANT
GLOVE INDICATOR 8.0 STRL GRN (GLOVE) ×3 IMPLANT
GOWN STRL REUS W/ TWL LRG LVL3 (GOWN DISPOSABLE) ×2 IMPLANT
GOWN STRL REUS W/TWL LRG LVL3 (GOWN DISPOSABLE) ×4
KIT TURNOVER KIT A (KITS) ×3 IMPLANT
MATRIX WOUND 3-LAYER 10X15 (Tissue) ×2 IMPLANT
MICROMATRIX 1000MG (Tissue) ×6 IMPLANT
NS IRRIG 500ML POUR BTL (IV SOLUTION) ×3 IMPLANT
PACK BASIN MINOR ARMC (MISCELLANEOUS) ×3 IMPLANT
PAD ABD DERMACEA PRESS 5X9 (GAUZE/BANDAGES/DRESSINGS) ×2 IMPLANT
PAD DRESSING TELFA 3X8 NADH (GAUZE/BANDAGES/DRESSINGS) IMPLANT
PAD PREP 24X41 OB/GYN DISP (PERSONAL CARE ITEMS) ×3 IMPLANT
SOLUTION PARTIC MCRMTRX 1000MG (Tissue) IMPLANT
STOCKINETTE STRL 6IN 960660 (GAUZE/BANDAGES/DRESSINGS) IMPLANT
STRIP CLOSURE SKIN 1/2X4 (GAUZE/BANDAGES/DRESSINGS) IMPLANT
SUT ETHILON 4-0 (SUTURE) ×2
SUT ETHILON 4-0 FS2 18XMFL BLK (SUTURE) ×1
SUT SURGILON 0 BLK 1X30 T-5 (SUTURE) ×10 IMPLANT
SUT VIC AB 2-0 CT1 (SUTURE) ×3 IMPLANT
SUT VIC AB 3-0 SH 27 (SUTURE) ×2
SUT VIC AB 3-0 SH 27X BRD (SUTURE) ×1 IMPLANT
SUT VIC AB 4-0 FS2 27 (SUTURE) ×3 IMPLANT
SUT VICRYL+ 3-0 144IN (SUTURE) ×3 IMPLANT
SUTURE ETHLN 4-0 FS2 18XMF BLK (SUTURE) ×1 IMPLANT
SWAB DUAL CULTURE TRANS RED ST (MISCELLANEOUS) IMPLANT
SWABSTK COMLB BENZOIN TINCTURE (MISCELLANEOUS) IMPLANT
TOWEL OR 17X26 4PK STRL BLUE (TOWEL DISPOSABLE) ×2 IMPLANT
WOUND MATRIX 3-LAYER 10X15 (Tissue) ×2 IMPLANT

## 2018-11-19 NOTE — Care Management Important Message (Signed)
Important Message  Patient Details  Name: [name] MRN: [id number] Date of Birth: 30-Apr-1935   Medicare Important Message Given:  Yes    [name] 11/19/2018, 1:00 PM

## 2018-11-19 NOTE — Progress Notes (Signed)
Patient with daughter [name] at bedside, concerned about purewick not working correctly this Financial planner at bedside to [employer], and clean and dry patient and linen. This RN encouraged patient that it may be better to call for assistance to the [facility] in order to promote mobility, patient refusing at this time.

## 2018-11-19 NOTE — Progress Notes (Signed)
Patient back to room from OR after dressing change and skin graft to left lower extremity s/p ruptured hematoma. Dressing is C/D/I, no complaints of pain at this time. Patient placed back on purewick per request. Will continue to monitor.

## 2018-11-19 NOTE — Progress Notes (Addendum)
[facility] NAME: [name]    MR#:  [id number]  DATE OF BIRTH:  [birth year]  SUBJECTIVE:  CHIEF COMPLAINT:   Chief Complaint  Patient presents with   Leg Swelling   Patient is more alert and awake today .  Awaiting for dressing change today.  Slept well last night  daughter [name] at bedside  REVIEW OF SYSTEMS:  Review of Systems  Constitutional: Negative for chills and fever.  HENT: Negative for hearing loss and tinnitus.   Eyes: Negative for blurred vision and double vision.  Respiratory: Negative for cough and shortness of breath.   Cardiovascular: Negative for chest pain and palpitations.  Gastrointestinal: Negative for abdominal pain, heartburn, nausea and vomiting.  Genitourinary: Negative for dysuria and urgency.  Musculoskeletal: Negative for back pain and myalgias.  Skin: Negative for itching and rash.  Neurological: Negative for dizziness and headaches.  Psychiatric/Behavioral: Negative for depression and hallucinations.    DRUG ALLERGIES:   Allergies  Allergen Reactions   Ace Inhibitors     Cough   Doxycycline Nausea Only   Hydrocodone Itching and Nausea Only   Mercury     Other reaction(s): Unknown   Silver Dermatitis    Severe itching   Cephalexin Rash   Clindamycin/Lincomycin Rash   Nickel Rash   Penicillins Rash    Has patient had a PCN reaction causing immediate rash, facial/tongue/throat swelling, SOB or lightheadedness with hypotension: YES Has patient had a PCN reaction causing severe rash involving mucus membranes or skin necrosis: NO Has patient had a PCN reaction that required hospitalization NO Has patient had a PCN reaction occurring within the last 10 years: NO If all of the above answers are "NO", then may proceed with Cephalosporin use.   VITALS:  Blood pressure (!) 123/54, pulse (!) 59, temperature 98.1 F (36.7 C), temperature source Oral, resp. rate 16, height 4'  11" (1.499 m), weight 68.9 kg, SpO2 100 %. PHYSICAL EXAMINATION:   GENERAL:  84 y.o.-year-old elderly patient lying in the bed with no acute distress.  EYES: Pupils equal, round, reactive to light and accommodation. No scleral icterus. Extraocular muscles intact.  HEENT: Head atraumatic, normocephalic. Oropharynx and nasopharynx clear.  NECK:  Supple, no jugular venous distention. No thyroid enlargement, no tenderness.  LUNGS: Normal breath sounds bilaterally, no wheezing, rhonchi or crepitation.  Decreased bibasilar breath sounds. no use of accessory muscles of respiration.  CARDIOVASCULAR: S1, S2 normal. No  rubs, or gallops.  ABDOMEN: Soft, nontender, nondistended. Bowel sounds present.  EXTREMITIES: No pedal edema, cyanosis, or clubbing.  Left leg in Ace wrap dressing .  No active bleeding noticed bruising noticed no evidence of any bleeding clinically.  Good capillary refill on left toes. NEUROLOGIC: Awake, alert and oriented x2-3.  Sensation intact. Gait not checked.  Global weakness noted. PSYCHIATRIC: The patient is alert and oriented x 2-3.  SKIN: No obvious rash, lesion, or ulcer.   LABORATORY PANEL:  Female CBC Recent Labs  Lab 11/19/18 0619  WBC 4.2  HGB 8.8*  HCT 29.2*  PLT 148*   ------------------------------------------------------------------------------------------------------------------ Chemistries  Recent Labs  Lab 11/16/18 0608  11/19/18 0619  NA 137   < > 140  K 3.7   < > 4.0  CL 98   < > 104  CO2 29   < > 30  GLUCOSE 88   < > 90  BUN 69*   < > 38*  CREATININE 1.70*   < >  1.22*  CALCIUM 8.2*   < > 8.5*  MG 2.4  --   --    < > = values in this interval not displayed.   RADIOLOGY:  No results found. ASSESSMENT AND PLAN:   84 year old female with past medical history significant for mitral valve replacement with a mechanical valve on chronic Coumadin, atrial fibrillation/flutter status post pacemaker, hypertension, chronic systolic heart failure,  lymphedema, GERD and hypertension, pulmonary hypertension on chronic 2 L home oxygen brought from home secondary to left lower extremity hematoma.  #.  Left lower extremity hematoma-after a trauma while on Coumadin. -Appreciate surgical consult.  Patient had debridement of the left calf hematoma and a biological graft placed in on 11/11/2018. Patient was taken to the OR for dressing change on 11/13/2018.  Due to patient complaining of dressing being tight,  notified surgeon on call on 11/15/2018 who will examine and make dressing last night.  Patient has good capillary refill.  Discussed with Dr. Tollie Pizza on 11/16/2018, procedure dressing change rescheduled for 11/17/2018  Dr.Byrnett discussed with [name], DO from plastics.  Patient may be candidate for Acell covering.  Planning  for OR dressing change and possible graft application 3/00/7622  #Delirium-from wound culture with gram-negative rods/ pain medications/sundowning We will get blood cultures if the patient is febrile for temperature greater than 99 as patient's body temperature usually runs low at 97 according to the daughter Urine culture negative Wound culture with Enterobacter, few basilllus species Patient is started on aztreonam is allergic to penicillin and cephalosporins Minimize giving sedatives Clinically improving.  Almost there at her baseline according to the daughter   # Acute blood loss anemia-secondary to bleeding into her hematoma -Received 2 units packed RBC transfusion this admission.  Hemoglobin 7.1 on 11/16/2018. transfuse 1 more unit of blood  hemoglobin at 7.1 with 1 unit of blood transfusion at 8.7--9.2-8.8 -Continue to monitor hemoglobin closely.    Check in a.m.   # Acute on chronic systolic heart failure exacerbation- Clinically improved.  Denies any shortness of breath. Remains on chronic 2 L oxygen. -Appreciate cardiology input.  Patient has a pacemaker, atrial flutter/fibrillation status post DC  cardioversion last week. Patient already transitioned to torsemide 40 mg p.o. daily.  No ACE inhibitor due to underlying CKD and relative hypotension.Last echo with EF of 25%  4.  History of mechanical mitral valve surgery-Coumadin has been restarted INR 2.1 discontinued heparin drip, as hemoglobin dropped down to 7.1 we will continue Coumadin and monitor PT/INR -Cardiology recommended goal INR around 2.5 given her increased risk of bleeding  5.  CKD stage IV-Baseline creatinine seems to be around 2.3. Renal function remains fairly stable at this time with creatinine of 1.7-1.4-1.36-1.22  6.  Generalized weakness physical therapy recommending skilled nursing facility but daughter [name] a prefers home health PT Albumin is low , on dietary supplements  I have updated patient's daughter healthcare power of attorney [name] and updated her on treatment plans as outlined .Marland Kitchen  All questions were answered to her satisfaction  All the records are reviewed and case discussed with Care Management/Social Worker. Management plans discussed with the patient, daughter [name] at bedside and daughter is in agreement.  CODE STATUS: Full Code  TOTAL TIME TAKING CARE OF THIS PATIENT: 35  minutes.   More than 50% of the time was spent in counseling/coordination of care: YES  POSSIBLE D/C IN 2 DAYS, DEPENDING ON CLINICAL CONDITION.   Nicholes Mango M.D on 11/19/2018 at 12:14 PM  Between  7am to 6pm - Pager - [phone]  After 6pm go to [email or web address] - Proofreader  [facility]  Office  [phone]  CC: Primary care physician; Rusty Aus, MD  Note: This dictation was prepared with Dragon dictation along with smaller phrase technology. Any transcriptional errors that result from this process are unintentional.

## 2018-11-19 NOTE — Telephone Encounter (Signed)
Patient son called to see if Dr Delana Meyer had received the consult to come see his mother.I informed the patient son that Dr Delana Meyer has received the consult and has instructed his PA to consult the patient.The son stated his mother has seen the PA at another time but he prefer Dr Delana Meyer come evaluate his mother because he knows the severity of her condition.I did inform the patient son the today Delana Meyer is in clinic and not at the hospital

## 2018-11-19 NOTE — Progress Notes (Signed)
Quiet night.  For dressing change, possible biologic dressing this afternoon. Labs; HGB stable over last 2 days. Renal function improved.

## 2018-11-19 NOTE — Op Note (Signed)
DATE OF OPERATION: 11/19/2018  LOCATION: [facility] Operating Room Inpatient  PREOPERATIVE DIAGNOSIS: left leg wound from hematoma  POSTOPERATIVE DIAGNOSIS: Same  PROCEDURE: Preparation of left leg wound 18 x 25 x .5 cm for placement of Acell (10 x 15 two and 2 gm powder)   SURGEON: Rayford Williamsen Sanger Ananda Sitzer, DO  COSURGEON: Ollen Bowl, MD  EBL: 10 cc  CONDITION: Stable  COMPLICATIONS: None  INDICATION: The patient, [name], is a 84 y.o. female born on January 12, 1935, is here for treatment of a left leg wound.   PROCEDURE DETAILS:  The patient was seen prior to surgery and marked.  The IV antibiotics were given. The patient was taken to the operating room and given a general anesthetic. A standard time out was performed and all information was confirmed by those in the room. SCD was placed on the right leg.   The left leg was prepped and draped.   The leg was irrigated with saline.  The curette was then used to excise and remove the fibrous base of the wound.  The wound was 18 x 25 cm in size.  It looked clean.  There was quite a bit of blood flow.  Likely related to her anticoagulation.  A Telfa that was soaked with 1% lidocaine with epinephrine was held on the wound to help with hemostasis.  This was able to get it under control.  The ACell powder and sheet was then applied.  The Adaptic was placed over it and sutured in place.  5-0 Vicryl was used.  The thrombin was then placed on top of the Adaptic.  All of the ACell was used both the sheet and the powder.  The gauze was then coated with K-Y jelly and covered with ABDs.  The leg was wrapped with Kerlix and an Ace wrap.  Dr. Tollie Pizza and Dr. Tedra Coupe can perform the procedure and were present for the entire case.  The patient was allowed to wake up and taken to recovery room in stable condition at the end of the case. The family was notified at the end of the case.

## 2018-11-19 NOTE — Anesthesia Procedure Notes (Signed)
Date/Time: 11/19/2018 3:18 PM Performed by: Nelda Marseille, CRNA Pre-anesthesia Checklist: Patient identified, Emergency Drugs available, Suction available, Patient being monitored and Timeout performed Oxygen Delivery Method: Simple face mask

## 2018-11-19 NOTE — Discharge Instructions (Addendum)
Guide to Wound Care  Proper wound care may reduce the risk of infection, improve healing rates, and limit scarring.  This is a general guide to help care for and manage wounds treated with MicroMatrix or Cytal Wound Matrix.   Dressing Changes The frequency of dressing changes can vary based on which product was applied, the size of the wound, or the amount of wound drainage.  Place KY gel on the wound daily and cover with gauze, kerlex and ace wrap.  Dressing Types Primary Dressing:  Non-adherent dressing goes directly over wounds being treated with the powder or sheet (MicroMatrix and/or Cytal).  Secondary Dressing:  Secures the primary dressing in place and provides extra protection, compression, and absorption.  1. Wash Hands - To help decrease the risk of infection, caregivers should wash their hands for a minimum of 20 seconds and may use medical gloves.   2. Remove the Dressings - Avoid removing product from the wound by carefully removing the applicable dressing(s) at the time points recommended above, or as recommended by the treating physician.  Expected Color and Odor:  It is entirely normal for the wound to have an unpleasant odor and to form a caramel-colored gel as the product absorbs into the wound. It is  important to leave this gel on the wound site.  3. Clean the Wound -  Do not wipe off any of the caramel-colored gel on the wound.   If you are unsure what to do, ask the treating physician.  4. Inspect the Wound - Proper wound care requires accurate and clinically relevant wound assessment. Protocols for inspecting the wound may vary by facility, but it is important to  [id number] the length and width.  What to look out for:  Large or increased amount of drainage   Surrounding skin is red or hot to touch   Increased pain in or around the wound   Flu-like symptoms, fatigue, decreased appetite, fever   Hard, crusty wound surface with black or brown coloring  5.  Apply New Dressings - Dressings should cover the entire wound and be suitable for maintaining a moist wound environment. Refer to the back of this guide for more information.  Maintain a Hydrated Wound Area While hydration protocols may vary by facility, it is important to keep the wound area moist throughout the healing process. If the wound appears to be dry during dressing changes, select a dressing that will hydrate the wound and maintain that ideal moist environment. If you are unsure what to do, ask the treating physician.  Remodeling Process Every patient heals differently, and no two cases are the same. The size and location of the wound, product type and layering configurations, and general patient health all contribute to how quickly a wound will heal.  While many factors can influence the rate at which the product absorbs, the following can be used as a general guide.   THINGS TO DO: Refrain from smoking High protein diet and limit carbohydrates and suger Protect the wound from trauma Protect the dressing  Micromatrix powder       Cytal Sheet           Adaptic dressing     PHYSICIAN INSTRUCTIONS: ----------------------------------------  1.  Patient's oxygen saturations might drop with exertion, continue to monitor pulse ox continuously while working with physical therapy and she might need up to 6 L of oxygen.  Goal is to keep saturations 89% and above 2.  Once her blood pressure starts  improving, cardiology recommendation is to start low-dose losartan, Coreg and Aldactone. 3.  If the wound on her left leg shows any signs of possible infection, start antibiotics.  She has received ciprofloxacin while in the hospital and it helped 4. INR check every other day and call her cardiologist Dr. Rockey Situ if not therapeutic.  BMP check in 4 days 5. Please encourage patient to use  compression stocking on the right lower extremity to help with pedal edema.  Also keep the leg elevated when  laying flat if possible.  Patient prefers to use her own compression stocking

## 2018-11-19 NOTE — Progress Notes (Addendum)
[facility] for  Warfarin  Indication: atrial fibrillation, Mechanical Mitral Valve  Allergies  Allergen Reactions  . Ace Inhibitors     Cough  . Doxycycline Nausea Only  . Hydrocodone Itching and Nausea Only  . Mercury     Other reaction(s): Unknown  . Silver Dermatitis    Severe itching  . Cephalexin Rash  . Clindamycin/Lincomycin Rash  . Nickel Rash  . Penicillins Rash    Has patient had a PCN reaction causing immediate rash, facial/tongue/throat swelling, SOB or lightheadedness with hypotension: YES Has patient had a PCN reaction causing severe rash involving mucus membranes or skin necrosis: NO Has patient had a PCN reaction that required hospitalization NO Has patient had a PCN reaction occurring within the last 10 years: NO If all of the above answers are "NO", then may proceed with Cephalosporin use.    Patient Measurements: Height: 4\' 11"  (149.9 cm) Weight: 151 lb 14.4 oz (68.9 kg) IBW/kg (Calculated) : 43.2 Heparin Dosing Weight: 62.6 kg  Vital Signs: Temp: 98.1 F (36.7 C) (05/21 0815) Temp Source: Oral (05/21 0815) BP: 123/54 (05/21 0815) Pulse Rate: 59 (05/21 0815)  Labs: Recent Labs    11/17/18 0530 11/18/18 0325 11/19/18 0619  HGB 8.7* 9.2* 8.8*  HCT 27.8* 30.4* 29.2*  PLT 140* 153 148*  LABPROT 22.8* 25.4* 23.3*  INR 2.0* 2.4* 2.1*  CREATININE 1.43* 1.36* 1.22*    Estimated Creatinine Clearance: 30.5 mL/min (A) (by C-G formula based on SCr of 1.22 mg/dL (H)).   Medical History: Past Medical History:  Diagnosis Date  . Arthritis    "back, hands, knees" (04/10/2016)  . Chronic atrial fibrillation    a. On Coumadin  . Chronic back pain   . 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 EF 25% by echo in 08/2015 b. RHC in 08/2015 showed normal filling pressures  . Chronic kidney disease   . Compression fracture    "several; all in my back" (04/10/2016)  .  GERD (gastroesophageal reflux disease)   . Hypertension   . Lymphedema   . Pacemaker 05/2017  . S/P MVR (mitral valve replacement)    a. MVR 1994 b. redo MVR in 08/2014 - on Coumadin  . Shortness of breath dyspnea      Assessment: Pharmacy was consulted to start heparin bridge with warfarin. Pt was on warfarin previously for atrial fibrillation with mechanical mitral valve. Per coumadin clinic notes, patient's INR goal is 2.5 - 3.5. Her home evening dose of warfarin is: 6 mg MWF and 3 mg TThSaSun. On admission patient's INR was 3.5 and patient had a hematoma in her leg. Pt went for I&D on 5/13. Vitamin K was given 5/12 and INR has came down to 1.2 for procedure.  Drug interactions: Amiodarone which can increase INR.   Date INR Warfarin Dose  5/11 3.2 Held  5/12 2.8 Held and gave Vitamin K IV  5/13 1.2 6 mg  5/14 1.2 6 mg  5/15 1.3 6 mg  5/16 1.6 6 mg   5/17 1.9 --  5/18 2.3 -- Not given due to pt being lethargic  5/19 2.0 6 mg   5/20 2.4 No dose ordered/charted?  5/21 2.1    Cardiology recommended goal INR around 2.5 given her increased risk of bleeding  Goal of Therapy:  INR 2-3, given recent bleed (per cardiology). Monitor platelets by anticoagulation protocol: Yes   Plan:   INR 2.1 -  Major DDI with amiodarone (home med). Also on Azactam.  No dose charted/ordered for yesterday.-Noted going to OR for dressing chg and possible graft application today 5/92 and cipro preop x1 Will order warfarin 5 mg x 1 for today  Pharmacy will continue to follow and monitor. If INR falls below 2 may need to restart heparin gtt.    Noralee Space, PharmD, BCPS Clinical Pharmacist 11/19/2018 8:49 AM

## 2018-11-19 NOTE — Transfer of Care (Signed)
Immediate Anesthesia Transfer of Care Note  Patient: [name]  Procedure(s) Performed: LEFT LEG DRESSING CHANGE AND GRAFT APPLICATION (Left )  Patient Location: PACU  Anesthesia Type:General  Level of Consciousness: awake, sedated and confused  Airway & Oxygen Therapy: Patient Spontanous Breathing and Patient connected to face mask oxygen  Post-op Assessment: Report given to RN and Post -op Vital signs reviewed and stable  Post vital signs: Reviewed and stable  Last Vitals:  Vitals Value Taken Time  BP 114/47 11/19/2018  3:15 PM  Temp 36.7 C 11/19/2018  3:15 PM  Pulse 60 11/19/2018  3:18 PM  Resp 23 11/19/2018  3:18 PM  SpO2 90 % 11/19/2018  3:18 PM  Vitals shown include unvalidated device data.  Last Pain:  Vitals:   11/19/18 1515  TempSrc:   PainSc: Asleep      Patients Stated Pain Goal: 0 (94/37/00 5259)  Complications: No apparent anesthesia complications

## 2018-11-19 NOTE — Progress Notes (Addendum)
Pt dressing was bleeding about using the BSC. Dressing was reinforced with an ABD pad layer.  Pt was advised that will be notifying the plastic surgeon that did the debridement but pt states she do not want to get bothered tonight. Daughter [name] made aware and states " I'm Okay with it". Will continue to monitor.  Update 2351: Dr. Denna Haggard was notified and states will check pt in the morning. States to continue to monitor.   Update 0608. Pt have an order for orthostatic vital sign. Lying and sitting was done, but to check the standing one due to pt refusal. Will notify incoming shift. Will continue to monitor.  Update 0636: Pt refused to get her weight today but was educated about the importance of it. Will notify incoming shift. Will continue to monitor.

## 2018-11-19 NOTE — Anesthesia Preprocedure Evaluation (Signed)
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  Hx AFib) Atrial Fibrillation + pacemaker + Valvular Problems/Murmurs (s/p MVR)   Echo 11/05/18: 1. EF severely reduced 25-30% and mildly dilated LV. Diastolic dysfunction (not graded). Left ventrical global hypokinesis   2. RV mildly reduced systolic function. The cavity was moderately enlarged. There is no increase in right ventricular wall thickness. RV systolic pressure is moderately elevated with an estimated pressure of 53 mmHg.  3. Right atrial size was moderately dilated.  4. Left atrial size was moderately dilated.  5. The aortic valve was not well visualized. Moderate calcification of the aortic valve. Aortic valve regurgitation was not assessed by color flow Doppler. Unable to estimate degree of aortic valve stenosis given severely reduced systolic function.  6. Mechanical mitral valve. Mean gradient 5 mm Hg.  7. Tricuspid valve regurgitation is moderate.  8. Left pleural effusion noted, 6.5 cm  9. Rhythm is atrial tachycardia, rate 105 bpm    Neuro/Psych negative neurological ROS  negative psych ROS   GI/Hepatic Neg liver ROS, GERD  Controlled,  Endo/Other  negative endocrine ROS  Renal/GU CRFRenal disease  negative genitourinary   Musculoskeletal  (+) Arthritis ,   Abdominal   Peds  Hematology negative hematology ROS (+)   Anesthesia Other Findings Past Medical History: No date: Arthritis     Comment:  "back, hands, knees" (04/10/2016) No date: Chronic atrial fibrillation     Comment:  a. On Coumadin No  date: Chronic back pain No date: Chronic combined systolic (congestive) and diastolic  (congestive) heart failure (HCC)     Comment:  a. EF 25% by echo in 08/2015 b. RHC in 08/2015 showed               normal filling pressures No date: Chronic kidney disease No date: Compression fracture     Comment:  "several; all in my back" (04/10/2016) No date: GERD (gastroesophageal reflux disease) No date: Hypertension No date: Lymphedema 05/2017: Pacemaker No date: S/P MVR (mitral valve replacement)     Comment:  a. MVR 1994 b. redo MVR in 08/2014 - on Coumadin No date: Shortness of breath dyspnea  Past Surgical History: 2005?: BREAST LUMPECTOMY; Right     Comment:  benign lump excision No date: [place], 2016: CARDIAC VALVE REPLACEMENT     Comment:  "MVR; MVR" 11/06/2018: CARDIOVERSION; N/A     Comment:  Procedure: CARDIOVERSION;  Surgeon: Minna Merritts,               MD;  Location: [facility] ORS;  Service: Cardiovascular;                Laterality: N/A; 02/20/2015: CATARACT EXTRACTION W/PHACO; Right     Comment:  Procedure: CATARACT EXTRACTION PHACO AND INTRAOCULAR               LENS PLACEMENT ([place]);  Surgeon: Estill Cotta, MD;                Location: [facility] OR[facility];  Service: Ophthalmology;  Laterality:  Right;  US01:31.2 AP 25.3% CDE40.13 Fluid lot #               [id number] 10/09/2015: ELECTROPHYSIOLOGIC STUDY; N/A     Comment:  Procedure: CARDIOVERSION;  Surgeon: Wellington Hampshire,               MD;  Location: [facility];  Service: Cardiovascular;                Laterality: N/A; 04/01/2016: IR GENERIC HISTORICAL     Comment:  IR RADIOLOGIST EVAL & MGMT 04/01/2016 [facility] 04/15/2016: IR GENERIC HISTORICAL     Comment:  IR VERTEBROPLASTY CERV/THOR BX INC UNI/BIL               INC/INJECT/IMAGING 04/15/2016 Luanne Bras, MD               [facility] 04/15/2016: IR GENERIC HISTORICAL     Comment:  IR VERTEBROPLASTY EA ADDL (T&LS) BX INC UNI/BIL  INC               INJECT/IMAGING 04/15/2016 Luanne Bras, MD [facility]              RAD 04/15/2016: IR GENERIC HISTORICAL     Comment:  IR VERTEBROPLASTY EA ADDL (T&LS) BX INC UNI/BIL INC               INJECT/IMAGING 04/15/2016 Luanne Bras, MD [facility]              RAD 05/20/2016: IR GENERIC HISTORICAL     Comment:  IR RADIOLOGIST EVAL & MGMT 05/20/2016 [facility] 06/13/2016: IR GENERIC HISTORICAL     Comment:  IR VERTEBROPLASTY EA ADDL (T&LS) BX INC UNI/BIL INC               INJECT/IMAGING 06/13/2016 Luanne Bras, MD [facility]              RAD 06/13/2016: IR GENERIC HISTORICAL     Comment:  IR SACROPLASTY BILATERAL 06/13/2016 Luanne Bras,               MD [facility] 06/13/2016: IR GENERIC HISTORICAL     Comment:  IR VERTEBROPLASTY LUMBAR BX INC UNI/BIL               INC/INJECT/IMAGING 06/13/2016 Luanne Bras, MD               [facility] 07/05/2015: IRRIGATION AND DEBRIDEMENT HEMATOMA; Left     Comment:  Procedure: IRRIGATION AND DEBRIDEMENT HEMATOMA;                Surgeon: Robert Bellow, MD;  Location: [facility] OR[facility];                Service: General;  Laterality: Left; 11/11/2018: IRRIGATION AND DEBRIDEMENT HEMATOMA; Left     Comment:  Procedure: IRRIGATION AND DEBRIDEMENT LEFT LEG HEMATOMA;              Surgeon: Robert Bellow, MD;  Location: [facility] OR[facility];                Service: General;  Laterality: Left; 07/1937: pyloric stenosis No date: US ECHOCARDIOGRAPHY  BMI    Body Mass Index:  28.09 kg/m      Reproductive/Obstetrics negative OB ROS                             Anesthesia Physical  Anesthesia Plan  ASA: IV  Anesthesia Plan: General  Post-op Pain Management:    Induction:   PONV Risk Score and Plan: TIVA  Airway Management Planned: Simple Face Mask, Natural Airway and Nasal Cannula  Additional Equipment:   Intra-op Plan:   Post-operative Plan:   Informed Consent: I have reviewed the  patients History and Physical, chart, labs and discussed the procedure including the risks, benefits and alternatives for the proposed anesthesia with the patient or authorized representative who has indicated his/her understanding and acceptance.     Dental Advisory Given  Plan Discussed with: Anesthesiologist and CRNA  Anesthesia Plan Comments:         Anesthesia Quick Evaluation

## 2018-11-19 NOTE — Telephone Encounter (Signed)
Pt son Hoover Browns [phone])  Called and stated that mother is receiving poor care at [facility]. He states that they are not allowing appropriate communication, caused hematoma on leg from wraps being to tight and on to long. Pt has been requesting to speak with [facility] but has been denied to do so. Paitent son is very upset and has many complaints about poor care. I advised patient that he needed to contact Patient Experience with [facility] and provided him the number. Patient son wants to speak to [employer]. I advised him that provider doesn't take call in office. He is demandin the fact that he needs to speak with [facility]ier. I advised him to contact Patient experience and that I would send a message to [facility] letting him know that he had called. AS, CMA

## 2018-11-19 NOTE — Progress Notes (Addendum)
Initial Nutrition Assessment  DOCUMENTATION CODES:   Not applicable  INTERVENTION:   Ensure Enlive po BID, each supplement provides 350 kcal and 20 grams of protein  MVI daily   Vitamin C 250mg  po BID  NUTRITION DIAGNOSIS:   Increased nutrient needs related to wound healing as evidenced by increased estimated needs.  GOAL:   Patient will meet greater than or equal to 90% of their needs  MONITOR:   PO intake, Supplement acceptance, Labs, Weight trends, Skin, I & O's  REASON FOR ASSESSMENT:   Consult Wound healing  ASSESSMENT:   84 year old female with past medical history significant for mitral valve replacement with a mechanical valve on chronic Coumadin, atrial fibrillation/flutter status post pacemaker, CKD IV, hypertension, chronic systolic heart failure, lymphedema, GERD and hypertension, pulmonary hypertension on chronic 2 L home oxygen brought from home secondary to left lower extremity hematoma.  RD working remotely.  Unable to reach pt via phone today. Pt scheduled for debridement with acell placement left leg today. Per chart review, pt eating only sips and bites of meals. Pt does enjoy Ensure and has drank this in the past while hospitalized. RD will add supplements and vitamins to support wound healing. Per chart, pt appears fairly weight stable pta.   Medications reviewed and include: vitamin D, MVI, Miralax, torsemide, vitamin C, warfarin, aztreonam, ciprofloxacin, LRS @50ml /hr   Labs reviewed: BUN 38(H), creat 1.22(H) Hgb 8.8(L), Hct 29.2(L)  Unable to complete Nutrition-Focused physical exam at this time.   Diet Order:   Diet Order            Diet NPO time specified  Diet effective midnight             EDUCATION NEEDS:   Not appropriate for education at this time  Skin:  Skin Assessment: Reviewed RN Assessment(Incision L leg )  Last BM:  5/20- TYPE 6  Height:   Ht Readings from Last 1 Encounters:  11/19/18 4\' 11"  (1.499 m)    Weight:    Wt Readings from Last 1 Encounters:  11/19/18 68.9 kg    Ideal Body Weight:  44.5 kg  BMI:  Body mass index is 30.68 kg/m.  Estimated Nutritional Needs:   Kcal:  1500-1700kcal/day  Protein:  75-85g/day   Fluid:  >1.2L/day   Koleen Distance MS, RD, LDN Pager #- [phone] Office#- [phone] After Hours Pager: [phone]

## 2018-11-19 NOTE — Progress Notes (Signed)
Report given to [facility], OR RN.

## 2018-11-19 NOTE — H&P (Signed)
[name] is an 84 y.o. female.   Chief Complaint: left leg wound HPI: The patient is an 84 yrs old wf here for treatment of her left leg. She has multiple medical conditions including CHF, GERD, severe lymphedema, kidney disease and is on anticoagulation.  She had a large hematoma of her left leg and was admitted.  She underwent debridement of the area 3 days ago.  She is now here for further treatment.  The area is large on the left leg medial side.      Past Medical History:  Diagnosis Date   Arthritis    "back, hands, knees" (04/10/2016)   Chronic atrial fibrillation    a. On Coumadin   Chronic back pain    Chronic combined systolic (congestive) and diastolic (congestive) heart failure (HCC)    a. EF 25% by echo in 08/2015 b. RHC in 08/2015 showed normal filling pressures   Chronic kidney disease    Compression fracture    "several; all in my back" (04/10/2016)   GERD (gastroesophageal reflux disease)    Hypertension    Lymphedema    Pacemaker 05/2017   S/P MVR (mitral valve replacement)    a. MVR 1994 b. redo MVR in 08/2014 - on Coumadin   Shortness of breath dyspnea     Past Surgical History:  Procedure Laterality Date   BREAST LUMPECTOMY Right 2005?   benign lump excision   CARDIAC CATHETERIZATION     CARDIAC VALVE REPLACEMENT  1994, 2016   "MVR; MVR"   CARDIOVERSION N/A 11/06/2018   Procedure: CARDIOVERSION;  Surgeon: Minna Merritts, MD;  Location: [facility] OR[facility];  Service: Cardiovascular;  Laterality: N/A;   CATARACT EXTRACTION W/PHACO Right 02/20/2015   Procedure: CATARACT EXTRACTION PHACO AND INTRAOCULAR LENS PLACEMENT ([place]);  Surgeon: Estill Cotta, MD;  Location: [facility];  Service: Ophthalmology;  Laterality: Right;  US01:31.2 AP 25.3% CDE40.13 Fluid lot # [id number]   DRESSING CHANGE UNDER ANESTHESIA Left 11/17/2018   Procedure: DRESSING CHANGE;  Surgeon: Robert Bellow, MD;  Location: [facility];  Service: General;  Laterality: Left;   no anesthesia   ELECTROPHYSIOLOGIC STUDY N/A 10/09/2015   Procedure: CARDIOVERSION;  Surgeon: Wellington Hampshire, MD;  Location: [facility];  Service: Cardiovascular;  Laterality: N/A;   INCISION AND DRAINAGE OF WOUND Left 11/13/2018   Procedure: LEFT LEG DRESSING CHANGE;  Surgeon: Robert Bellow, MD;  Location: [facility];  Service: General;  Laterality: Left;   IR GENERIC HISTORICAL  04/01/2016   IR RADIOLOGIST EVAL & MGMT 04/01/2016 [facility]   IR GENERIC HISTORICAL  04/15/2016   IR VERTEBROPLASTY CERV/THOR BX INC UNI/BIL INC/INJECT/IMAGING 04/15/2016 Luanne Bras, MD [facility]   IR GENERIC HISTORICAL  04/15/2016   IR VERTEBROPLASTY EA ADDL (T&LS) BX INC UNI/BIL INC INJECT/IMAGING 04/15/2016 Luanne Bras, MD [facility]   IR GENERIC HISTORICAL  04/15/2016   IR VERTEBROPLASTY EA ADDL (T&LS) BX INC UNI/BIL INC INJECT/IMAGING 04/15/2016 Luanne Bras, MD [facility]   IR GENERIC HISTORICAL  05/20/2016   IR RADIOLOGIST EVAL & MGMT 05/20/2016 [facility]   IR GENERIC HISTORICAL  06/13/2016   IR VERTEBROPLASTY EA ADDL (T&LS) BX INC UNI/BIL INC INJECT/IMAGING 06/13/2016 Luanne Bras, MD [facility]   IR GENERIC HISTORICAL  06/13/2016   IR SACROPLASTY BILATERAL 06/13/2016 Luanne Bras, MD [facility]   IR GENERIC HISTORICAL  06/13/2016   IR VERTEBROPLASTY LUMBAR BX INC UNI/BIL INC/INJECT/IMAGING 06/13/2016 Luanne Bras, MD [facility]   IRRIGATION AND DEBRIDEMENT HEMATOMA Left 07/05/2015  Procedure: IRRIGATION AND DEBRIDEMENT HEMATOMA;  Surgeon: Robert Bellow, MD;  Location: [facility];  Service: General;  Laterality: Left;   IRRIGATION AND DEBRIDEMENT HEMATOMA Left 11/11/2018   Procedure: IRRIGATION AND DEBRIDEMENT LEFT LEG HEMATOMA;  Surgeon: Robert Bellow, MD;  Location: [facility];  Service: General;  Laterality: Left;   pyloric stenosis  07/1937   US ECHOCARDIOGRAPHY      Family History  Problem Relation Age of Onset   Stroke  Mother    Renal Disease Father    Social History:  reports that she quit smoking about 38 years ago. Her smoking use included cigarettes. She has a 27.00 pack-year smoking history. She has never used smokeless tobacco. She reports previous alcohol use. She reports that she does not use drugs.  Allergies:  Allergies  Allergen Reactions   Ace Inhibitors     Cough   Doxycycline Nausea Only   Hydrocodone Itching and Nausea Only   Mercury     Other reaction(s): Unknown   Silver Dermatitis    Severe itching   Cephalexin Rash   Clindamycin/Lincomycin Rash   Nickel Rash   Penicillins Rash    Has patient had a PCN reaction causing immediate rash, facial/tongue/throat swelling, SOB or lightheadedness with hypotension: YES Has patient had a PCN reaction causing severe rash involving mucus membranes or skin necrosis: NO Has patient had a PCN reaction that required hospitalization NO Has patient had a PCN reaction occurring within the last 10 years: NO If all of the above answers are "NO", then may proceed with Cephalosporin use.    Medications Prior to Admission  Medication Sig Dispense Refill   acetaminophen (TYLENOL) 650 MG CR tablet Take 650 mg by mouth every 8 (eight) hours as needed for pain.     ALPRAZolam (XANAX) 0.25 MG tablet Take 0.25 mg by mouth at bedtime as needed for sleep.     amiodarone (PACERONE) 200 MG tablet Take 1 tablet (200 mg total) by mouth daily. 90 tablet 3   aspirin EC 81 MG tablet Take 81 mg by mouth daily.     carvedilol (COREG) 3.125 MG tablet Take 1 tablet (3.125 mg total) by mouth 2 (two) times daily with a meal. 60 tablet 0   Cholecalciferol (VITAMIN D) 2000 units tablet Take 2,000 Units by mouth daily.     KLOR-CON 10 10 MEQ tablet Take 10 mEq by mouth 2 (two) times daily.      LASIX 20 MG tablet TAKE ONE TABLET BY MOUTH 3 TIMES DAILY (Patient taking differently: Take 20 mg by mouth 3 (three) times daily. ) 270 tablet 0   losartan (COZAAR)  25 MG tablet TAKE 1/2 TABLET EVERY DAY (Patient taking differently: Take 12.5 mg by mouth daily. ) 30 tablet 3   Menthol (ICY HOT) 5 % PTCH Apply 1 patch topically daily as needed (pain).     metolazone (ZAROXOLYN) 5 MG tablet Take 5 mg by mouth daily as needed (severe fluid retention).     Probiotic Product (RISA-BID PROBIOTIC PO) Take 1 capsule by mouth daily.      spironolactone (ALDACTONE) 25 MG tablet Take 25 mg by mouth daily.      vitamin C (ASCORBIC ACID) 500 MG tablet Take 500 mg by mouth daily.     warfarin (COUMADIN) 6 MG tablet Take 3-6 mg by mouth See admin instructions. Take 1 tablet (6mg ) by mouth every Monday, Wednesday, Friday evening and take  tablet (3mg ) by mouth every Tuesday, Thursday, Saturday and  Sunday evening      Results for orders placed or performed during the hospital encounter of 11/09/18 (from the past 48 hour(s))  Protime-INR     Status: Abnormal   Collection Time: 11/18/18  3:25 AM  Result Value Ref Range   Prothrombin Time 25.4 (H) 11.4 - 15.2 seconds   INR 2.4 (H) 0.8 - 1.2    Comment: (NOTE) INR goal varies based on device and disease states. Performed at [facility], [address] [place], [place] [address]   CBC with Differential/Platelet     Status: Abnormal   Collection Time: 11/18/18  3:25 AM  Result Value Ref Range   WBC 5.1 4.0 - 10.5 K/uL   RBC 3.26 (L) 3.87 - 5.11 MIL/uL   Hemoglobin 9.2 (L) 12.0 - 15.0 g/dL   HCT 30.4 (L) 36.0 - 46.0 %   MCV 93.3 80.0 - 100.0 fL   MCH 28.2 26.0 - 34.0 pg   MCHC 30.3 30.0 - 36.0 g/dL   RDW 16.6 (H) 11.5 - 15.5 %   Platelets 153 150 - 400 K/uL   nRBC 0.0 0.0 - 0.2 %   Neutrophils Relative % 73 %   Neutro Abs 3.7 1.7 - 7.7 K/uL   Lymphocytes Relative 6 %   Lymphs Abs 0.3 (L) 0.7 - 4.0 K/uL   Monocytes Relative 17 %   Monocytes Absolute 0.9 0.1 - 1.0 K/uL   Eosinophils Relative 3 %   Eosinophils Absolute 0.2 0.0 - 0.5 K/uL   Basophils Relative 0 %   Basophils Absolute 0.0 0.0 -  0.1 K/uL   Immature Granulocytes 1 %   Abs Immature Granulocytes 0.03 0.00 - 0.07 K/uL    Comment: Performed at [facility], [address] [place], [place] [address]  Basic metabolic panel     Status: Abnormal   Collection Time: 11/18/18  3:25 AM  Result Value Ref Range   Sodium 137 135 - 145 mmol/L   Potassium 4.2 3.5 - 5.1 mmol/L   Chloride 100 98 - 111 mmol/L   CO2 29 22 - 32 mmol/L   Glucose, Bld 105 (H) 70 - 99 mg/dL   BUN 43 (H) 8 - 23 mg/dL   Creatinine, Ser 1.36 (H) 0.44 - 1.00 mg/dL   Calcium 8.5 (L) 8.9 - 10.3 mg/dL   GFR calc non Af Amer 36 (L) >60 mL/min   GFR calc Af Amer 42 (L) >60 mL/min   Anion gap 8 5 - 15    Comment: Performed at [facility], [address] [place], [place] [address]  Albumin     Status: Abnormal   Collection Time: 11/18/18  3:25 AM  Result Value Ref Range   Albumin 2.9 (L) 3.5 - 5.0 g/dL    Comment: Performed at [facility], [address] [place], [place] [address]  Protime-INR     Status: Abnormal   Collection Time: 11/19/18  6:19 AM  Result Value Ref Range   Prothrombin Time 23.3 (H) 11.4 - 15.2 seconds   INR 2.1 (H) 0.8 - 1.2    Comment: (NOTE) INR goal varies based on device and disease states. Performed at [facility], [address] [place], [place] [address]   CBC with Differential/Platelet     Status: Abnormal   Collection Time: 11/19/18  6:19 AM  Result Value Ref Range   WBC 4.2 4.0 - 10.5 K/uL   RBC 3.08 (L) 3.87 - 5.11 MIL/uL   Hemoglobin  8.8 (L) 12.0 - 15.0 g/dL   HCT 29.2 (L) 36.0 - 46.0 %   MCV 94.8 80.0 - 100.0 fL   MCH 28.6 26.0 - 34.0 pg   MCHC 30.1 30.0 - 36.0 g/dL   RDW 16.8 (H) 11.5 - 15.5 %   Platelets 148 (L) 150 - 400 K/uL   nRBC 0.0 0.0 - 0.2 %   Neutrophils Relative % 68 %   Neutro Abs 2.9 1.7 - 7.7 K/uL   Lymphocytes Relative 10 %   Lymphs Abs 0.4 (L) 0.7 - 4.0 K/uL   Monocytes Relative 17 %   Monocytes Absolute 0.7 0.1 - 1.0 K/uL   Eosinophils Relative  3 %   Eosinophils Absolute 0.1 0.0 - 0.5 K/uL   Basophils Relative 1 %   Basophils Absolute 0.0 0.0 - 0.1 K/uL   Immature Granulocytes 1 %   Abs Immature Granulocytes 0.03 0.00 - 0.07 K/uL    Comment: Performed at [facility], [address] [place], [place] [address]  Basic metabolic panel     Status: Abnormal   Collection Time: 11/19/18  6:19 AM  Result Value Ref Range   Sodium 140 135 - 145 mmol/L   Potassium 4.0 3.5 - 5.1 mmol/L   Chloride 104 98 - 111 mmol/L   CO2 30 22 - 32 mmol/L   Glucose, Bld 90 70 - 99 mg/dL   BUN 38 (H) 8 - 23 mg/dL   Creatinine, Ser 1.22 (H) 0.44 - 1.00 mg/dL   Calcium 8.5 (L) 8.9 - 10.3 mg/dL   GFR calc non Af Amer 42 (L) >60 mL/min   GFR calc Af Amer 48 (L) >60 mL/min   Anion gap 6 5 - 15    Comment: Performed at [facility], [address], [place], [place] [address]   No results found.  Review of Systems  Eyes: Negative.   Respiratory: Negative for shortness of breath.   Cardiovascular: Positive for leg swelling.  Genitourinary: Negative.   Skin: Negative.     Blood pressure (!) 121/57, pulse 66, temperature 98.2 F (36.8 C), temperature source Tympanic, resp. rate 16, height 4\' 11"  (1.499 m), weight 68.9 kg, SpO2 95 %. Physical Exam  Constitutional: She appears well-developed and well-nourished.  HENT:  Head: Normocephalic and atraumatic.  Eyes: EOM are normal.  Respiratory: Effort normal.  GI: Soft. She exhibits no distension.  Musculoskeletal:       Legs:  Neurological: She is alert.  Skin: Skin is warm.  Psychiatric: She has a normal mood and affect. Her behavior is normal.     Assessment/Plan Plan for debridement with acell placement left leg with Dr. Bary Castilla.  [place], DO 11/19/2018, 1:45 PM

## 2018-11-19 NOTE — Anesthesia Post-op Follow-up Note (Signed)
Anesthesia QCDR form completed.        

## 2018-11-20 ENCOUNTER — Encounter: Payer: Self-pay | Admitting: General Surgery

## 2018-11-20 LAB — CBC WITH DIFFERENTIAL/PLATELET
Abs Immature Granulocytes: 0.03 10*3/uL (ref 0.00–0.07)
Basophils Absolute: 0 10*3/uL (ref 0.0–0.1)
Basophils Relative: 1 %
Eosinophils Absolute: 0.1 10*3/uL (ref 0.0–0.5)
Eosinophils Relative: 2 %
HCT: 29.7 % — ABNORMAL LOW (ref 36.0–46.0)
Hemoglobin: 9 g/dL — ABNORMAL LOW (ref 12.0–15.0)
Immature Granulocytes: 1 %
Lymphocytes Relative: 7 %
Lymphs Abs: 0.4 10*3/uL — ABNORMAL LOW (ref 0.7–4.0)
MCH: 28.3 pg (ref 26.0–34.0)
MCHC: 30.3 g/dL (ref 30.0–36.0)
MCV: 93.4 fL (ref 80.0–100.0)
Monocytes Absolute: 0.7 10*3/uL (ref 0.1–1.0)
Monocytes Relative: 14 %
Neutro Abs: 3.9 10*3/uL (ref 1.7–7.7)
Neutrophils Relative %: 75 %
Platelets: 160 10*3/uL (ref 150–400)
RBC: 3.18 MIL/uL — ABNORMAL LOW (ref 3.87–5.11)
RDW: 16.6 % — ABNORMAL HIGH (ref 11.5–15.5)
WBC: 5.1 10*3/uL (ref 4.0–10.5)
nRBC: 0 % (ref 0.0–0.2)

## 2018-11-20 LAB — PROTIME-INR
INR: 2.2 — ABNORMAL HIGH (ref 0.8–1.2)
Prothrombin Time: 24 seconds — ABNORMAL HIGH (ref 11.4–15.2)

## 2018-11-20 LAB — BASIC METABOLIC PANEL
Anion gap: 8 (ref 5–15)
BUN: 36 mg/dL — ABNORMAL HIGH (ref 8–23)
CO2: 31 mmol/L (ref 22–32)
Calcium: 8.8 mg/dL — ABNORMAL LOW (ref 8.9–10.3)
Chloride: 100 mmol/L (ref 98–111)
Creatinine, Ser: 1.31 mg/dL — ABNORMAL HIGH (ref 0.44–1.00)
GFR calc Af Amer: 44 mL/min — ABNORMAL LOW (ref >60)
GFR calc non Af Amer: 38 mL/min — ABNORMAL LOW (ref >60)
Glucose, Bld: 94 mg/dL (ref 70–99)
Potassium: 4.1 mmol/L (ref 3.5–5.1)
Sodium: 139 mmol/L (ref 135–145)

## 2018-11-20 MED ORDER — ZINC SULFATE 220 (50 ZN) MG PO CAPS
220.0000 mg | ORAL_CAPSULE | Freq: Every day | ORAL | Status: DC
  Administered 2018-11-20 – 2018-11-30 (×9): 220 mg via ORAL
  Filled 2018-11-20 (×9): qty 1

## 2018-11-20 MED ORDER — WARFARIN SODIUM 4 MG PO TABS
4.0000 mg | ORAL_TABLET | Freq: Once | ORAL | Status: AC
  Administered 2018-11-20: 4 mg via ORAL
  Filled 2018-11-20: qty 1

## 2018-11-20 NOTE — Progress Notes (Addendum)
[place] for  Warfarin  Indication: atrial fibrillation, Mechanical Mitral Valve  Allergies  Allergen Reactions  . Ace Inhibitors     Cough  . Doxycycline Nausea Only  . Hydrocodone Itching and Nausea Only  . Mercury     Other reaction(s): Unknown  . Silver Dermatitis    Severe itching  . Cephalexin Rash  . Clindamycin/Lincomycin Rash  . Nickel Rash  . Penicillins Rash    Has patient had a PCN reaction causing immediate rash, facial/tongue/throat swelling, SOB or lightheadedness with hypotension: YES Has patient had a PCN reaction causing severe rash involving mucus membranes or skin necrosis: NO Has patient had a PCN reaction that required hospitalization NO Has patient had a PCN reaction occurring within the last 10 years: NO If all of the above answers are "NO", then may proceed with Cephalosporin use.    Patient Measurements: Height: 4\' 11"  (149.9 cm) Weight: (refused by pt) IBW/kg (Calculated) : 43.2 Heparin Dosing Weight: 62.6 kg  Vital Signs: Temp: 97.7 F (36.5 C) (05/22 0804) Temp Source: Oral (05/22 0804) BP: 143/60 (05/22 0804) Pulse Rate: 65 (05/22 0804)  Labs: Recent Labs    11/18/18 0325 11/19/18 0619 11/20/18 0427  HGB 9.2* 8.8* 9.0*  HCT 30.4* 29.2* 29.7*  PLT 153 148* 160  LABPROT 25.4* 23.3* 24.0*  INR 2.4* 2.1* 2.2*  CREATININE 1.36* 1.22* 1.31*    Estimated Creatinine Clearance: 28.4 mL/min (A) (by C-G formula based on SCr of 1.31 mg/dL (H)).   Medical History: Past Medical History:  Diagnosis Date  . Arthritis    "back, hands, knees" (04/10/2016)  . Chronic atrial fibrillation    a. On Coumadin  . Chronic back pain   . 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 EF 25% by echo in 08/2015 b. RHC in 08/2015 showed normal filling pressures  . Chronic kidney disease   . Compression fracture    "several; all in my back" (04/10/2016)  . GERD  (gastroesophageal reflux disease)   . Hypertension   . Lymphedema   . Pacemaker 05/2017  . S/P MVR (mitral valve replacement)    a. MVR 1994 b. redo MVR in 08/2014 - on Coumadin  . Shortness of breath dyspnea      Assessment: Pharmacy was consulted to start heparin bridge with warfarin. Pt was on warfarin previously for atrial fibrillation with mechanical mitral valve. Per coumadin clinic notes, patient's INR goal is 2.5 - 3.5. Her home evening dose of warfarin is: 6 mg MWF and 3 mg TThSaSun. On admission patient's INR was 3.5 and patient had a hematoma in her leg. Pt went for I&D on 5/13. Vitamin K was given 5/12 and INR has came down to 1.2 for procedure.  Drug interactions: Amiodarone which can increase INR.   Date INR Warfarin Dose  5/11 3.2 Held  5/12 2.8 Held and gave Vitamin K IV  5/13 1.2 6 mg  5/14 1.2 6 mg  5/15 1.3 6 mg  5/16 1.6 6 mg   5/17 1.9 --  5/18 2.3 -- Not given due to pt being lethargic  5/19 2.0 6 mg   5/20 2.4 No dose ordered/charted?  5/21 2.1  5mg   5/22 2.2    Cardiology recommended goal INR around 2.5 given her increased risk of bleeding  Goal of Therapy:  INR 2-3, given recent bleed (per cardiology). Monitor platelets by anticoagulation protocol: Yes   Plan:  INR 2.2 - Major DDI with amiodarone (home med). DDI w/Cipro- added yesterday 5/21. Will order warfarin 4 mg x 1 for today given above drug interactions.  Pharmacy will continue to follow and monitor.  If INR falls below 2 may need to restart heparin gtt.    Noralee Space, PharmD, BCPS Clinical Pharmacist 11/20/2018 9:11 AM

## 2018-11-20 NOTE — Plan of Care (Signed)

## 2018-11-20 NOTE — Progress Notes (Signed)
This RN assisted patient to [facility] with 1 assist and walker this morning. Patient O2 saturations on 3.5 L [place] desatting into high 70's to low 80's, patient returned to bed, recovered oxygen saturations at rest to 93% on 3.5 L [place].

## 2018-11-20 NOTE — Progress Notes (Signed)
Patient noted to have a small hematoma form this afternoon on her right lateral popliteal area, a foam dressing was applied and the area marked for future monitoring.

## 2018-11-20 NOTE — Progress Notes (Signed)
Patient's spirits are bright. Leg dressing shows 4 cm area of blood (old) on the ace wrap.  Appropriately reinforced a/ ABD pad and Kerlex by staff. Foot pink. Normal neuo/vascular exam. Dressing change scheduled for tomorrow by the nurse. Ace wrap, Kerlex and underlying abd/ gauze pads to be removed. Adaptic gauze is sutured in place and should not be removed.  To be covered with gauze heavily saturated with KY jelly, followed by gauze/ ABD pads, Kerlex wrap and light ace wrap.   Patient and daughter informed that RN will be doing the dressing changes every other day.    Emphasized need for good caloric intake for healing.  If ready for discharge from medical standpoint, arrangements can be made for home health to do the dressing changes.  If there are questions about the dressing, please contact Audelia Hives, DO.  [phone] (cell).

## 2018-11-20 NOTE — Progress Notes (Addendum)
[facility]  Communication Note  Re-consulted by Dr. Margaretmary Eddy as per family request over concerns in regard to the patients care. Please refer to my 11/09/18 consult.  Spoke with daughter who was waiting in patients room while patient was in OR with Dr. Bary Castilla and Dr. Marla Roe. We discussed the phone call received (please see 11/18/18 telephone encounter) from her brother Legrand Como) and his concerns in regard to the quality of care his mother is receiving and the "consult" to vascular surgery.  Patients daughter states Legrand Como is "very frustrated" due to not being allowed in the hospital to be with his mother. Please note the daughter was given permission to stay with her mother daily for support and has been involved with her care daily. I expressed my sympathy and understanding that due to [place] there has been a limit on visitors.  We discussed our family meeting which took place on 11/09/18 and reviewed vascular surgery recommendations (zinc oxide unna wrap and close observation of hematoma site as there was high risk for wound formation).  We discussed the families request with Dr. Brett Albino who was present at the meeting requesting Dr. Bary Castilla be consulted as well. All parties were in agreement at the time.   We also discussed that after consultation with Dr. Bary Castilla the family decided to move forward with debridement by him. Since no vascular surgery intervention / issue was noted at that time our service signed off.  The patients daughter and I had a long discussion that our service would not manage and treat the same medical issue which is being management (very well) by Dr. Bary Castilla. She is also aware that Dr. Delana Meyer is on vacation through the end of next week.  I assured the patients daughter that if a vascular issue should a rise while the patient was in house we are available and happy to offer our services.  At this time, vascular will sign off as the patients medical issues  are being managed wonderfully by her current team.  Please re-consult if a vascular issue should a rise. Thank you.  Discussed in detail with Dr. Eber Hong [facility] PA-C 11/20/2018 9:59 AM

## 2018-11-20 NOTE — Progress Notes (Signed)
[facility] [place] NAME: [name]    MR#:  [id number]  DATE OF BIRTH:  Aug 23, 1934  SUBJECTIVE:   Patient is more alert and awake today. Eating better. dter [name] in the room  dressing change by dr byrnett this am  REVIEW OF SYSTEMS:  Review of Systems  Constitutional: Negative for chills and fever.  HENT: Negative for hearing loss and tinnitus.   Eyes: Negative for blurred vision and double vision.  Respiratory: Negative for cough and shortness of breath.   Cardiovascular: Negative for chest pain and palpitations.  Gastrointestinal: Negative for abdominal pain, heartburn, nausea and vomiting.  Genitourinary: Negative for dysuria and urgency.  Musculoskeletal: Negative for back pain and myalgias.  Skin: Negative for itching and rash.  Neurological: Negative for dizziness and headaches.  Psychiatric/Behavioral: Negative for depression and hallucinations.    DRUG ALLERGIES:   Allergies  Allergen Reactions  . Ace Inhibitors     Cough  . Doxycycline Nausea Only  . Hydrocodone Itching and Nausea Only  . Mercury     Other reaction(s): Unknown  . Silver Dermatitis    Severe itching  . Cephalexin Rash  . Clindamycin/Lincomycin Rash  . Nickel Rash  . Penicillins Rash    Has patient had a PCN reaction causing immediate rash, facial/tongue/throat swelling, SOB or lightheadedness with hypotension: YES Has patient had a PCN reaction causing severe rash involving mucus membranes or skin necrosis: NO Has patient had a PCN reaction that required hospitalization NO Has patient had a PCN reaction occurring within the last 10 years: NO If all of the above answers are "NO", then may proceed with Cephalosporin use.   VITALS:  Blood pressure (!) 143/60, pulse 65, temperature 97.7 F (36.5 C), temperature source Oral, resp. rate 19, height 4\' 11"  (1.499 m), weight 68.9 kg, SpO2 94 %. PHYSICAL EXAMINATION:   GENERAL:  84  y.o.-year-old elderly patient lying in the bed with no acute distress.  EYES: Pupils equal, round, reactive to light and accommodation. No scleral icterus. Extraocular muscles intact.  HEENT: Head atraumatic, normocephalic. Oropharynx and nasopharynx clear.  NECK:  Supple, no jugular venous distention. No thyroid enlargement, no tenderness.  LUNGS: Normal breath sounds bilaterally, no wheezing, rhonchi or crepitation.  Decreased bibasilar breath sounds. no use of accessory muscles of respiration.  CARDIOVASCULAR: S1, S2 normal. No  rubs, or gallops.  ABDOMEN: Soft, nontender, nondistended. Bowel sounds present.  EXTREMITIES: No pedal edema, cyanosis, or clubbing.  Left leg in Ace wrap dressingNo active bleeding noticed bruising noticed no evidence of any bleeding clinically.  Good capillary refill on left toes. NEUROLOGIC: Awake, alert and oriented x2-3.  Sensation intact. Gait not checked.  Global weakness noted. PSYCHIATRIC: The patient is alert and oriented x 2  SKIN: No obvious rash, lesion, or ulcer.   LABORATORY PANEL:  Female CBC Recent Labs  Lab 11/20/18 0427  WBC 5.1  HGB 9.0*  HCT 29.7*  PLT 160   ------------------------------------------------------------------------------------------------------------------ Chemistries  Recent Labs  Lab 11/16/18 0608  11/20/18 0427  NA 137   < > 139  K 3.7   < > 4.1  CL 98   < > 100  CO2 29   < > 31  GLUCOSE 88   < > 94  BUN 69*   < > 36*  CREATININE 1.70*   < > 1.31*  CALCIUM 8.2*   < > 8.8*  MG 2.4  --   --    < > =  values in this interval not displayed.   RADIOLOGY:  No results found. ASSESSMENT AND PLAN:   84 year old female with past medical history significant for mitral valve replacement with a mechanical valve on chronic Coumadin, atrial fibrillation/flutter status post pacemaker, hypertension, chronic systolic heart failure, lymphedema, GERD and hypertension, pulmonary hypertension on chronic 2 L home oxygen brought  from home secondary to left lower extremity hematoma.  #.  Left lower extremity hematoma-after a trauma while on Coumadin. -Appreciate surgical consult.  Patient had debridement of the left calf hematoma and a biological graft placed in on 11/11/2018. Patient was taken to the OR for dressing change on 11/13/2018.  Dr.Byrnett discussed with [name], DO from plastics.  Patient is s/p Acell placement on 11/19/2018 -dressing change today by Dr Bary Castilla     #Delirium-from wound culture with gram-negative rods/ pain medications/sundowning--resolved -Urine culture negative -Wound culture with Enterobacter, few basilllus species--changed to po cipro per sensitivity -Clinically improving  # Acute blood loss anemia-secondary to bleeding into her hematoma -Received 2 units packed RBC transfusion this admission.  Hemoglobin 7.1 on 11/16/2018. transfuse 1 more unit of blood  hemoglobin at 7.1 with 1 unit of blood transfusion at 8.7--9.2--9.0 -Continue to monitor hemoglobin closely.    # Acute on chronic systolic heart failure exacerbation- Clinically improved.  Denies any shortness of breath. Remains on chronic 2 L oxygen. -Appreciate cardiology input.  Patient has a pacemaker, atrial flutter/fibrillation status post DC cardioversion last week. -Patient already transitioned to torsemide 40 mg p.o. daily.   -No ACE inhibitor due to underlying CKD and relative hypotension.Last echo with EF of 25%  4.  History of mechanical mitral valve surgery-Coumadin has been restarted INR 2.4 discontinued heparin drip, as hemoglobin dropped down to 7.1 we will continue Coumadin and monitor PT/INR -Cardiology recommended goal INR around 2.5 given her increased risk of bleeding  5.  CKD stage IV-Baseline creatinine seems to be around 2.3. Renal function remains fairly stable at this time with creatinine of 1.7-1.4-1.36  6.  Generalized weakness physical therapy recommending skilled nursing facility but  daughter [name] a prefers home health PT  I have updated patient's daughter healthcare power of attorney [name] and updated her on treatment plans as outlined .Marland Kitchen  All questions were answered to her satisfaction  All the records are reviewed and case discussed with Care Management/Social Worker. Management plans discussed with the patient, daughter at bedside and daughter is in agreement.  CODE STATUS: Full Code  TOTAL TIME TAKING CARE OF THIS PATIENT: 35 minutes.   More than 50% of the time was spent in counseling/coordination of care: YES  POSSIBLE D/C IN 2-3 DAYS, DEPENDING ON CLINICAL CONDITION.   Fritzi Mandes M.D on 11/20/2018 at 12:18 PM  Between 7am to 6pm - Pager - [phone]  After 6pm go to [email or web address] - Proofreader  [facility]  Office  [phone]  CC: Primary care physician; Rusty Aus, MD  Note: This dictation was prepared with Dragon dictation along with smaller phrase technology. Any transcriptional errors that result from this process are unintentional.

## 2018-11-20 NOTE — Progress Notes (Signed)
Physical Therapy Treatment Patient Details Name: [name] MRN: [id number] DOB: [birth year] Today's Date: 11/20/2018    History of Present Illness Pt is an 84 y.o. female with recent hospital discharge 11/08/18 (CHF exacerbation with cardioversion); returned to hospital 11/09/18 with ruptured large L LE hematoma.  Pt admitted with acute blood loss anemia from large L LE hematoma with bleeding (likely secondary anticoagulation therapy for a-fib, MVR, and pacemaker).  S/p debridement of L LE hematoma 11/11/18.  11/13/18 s/p dressing change of L LE wound with application of Neox graft, 5/19 dressing change, 5/21 dressing change and application of Acell.  PMH includes cardioversion 11/06/18, chronic a-fib, chronic back pain, systolic and diastolic CHF, CKD, compression fx back, pacemaker, s/p MVR, pulmonary htn, chronic venous insufficiency, B LE edema, and chronic 3 L O2 at home.    PT Comments    Patient sleeping upon arrival to room, but easily awakens.  Agreeable to session with mod encouragement from both patient and daughter.  Able to initiate very short-distance gait training (10' with RW, min assist)-- 3-point, step to gait pattern with decreased weight acceptance/stance time L LE.  Slow and deliberate; poor balance, poor overall endurance/activity tolerance.  Question some degree of L ankle PF contracture.  Significant desat to 83-84% with short-distance gait despite incease in [place] O2 to 6L; does recover >90% within 1-2 min seated rest and pursed lip breathing.  RN informed/aware.  Patient returned to baseline 3.5L with sats 90-91% at rest end of session.  Given significant deconditioning and level of assist required for mobility, may benefit from manual [facility] for use at discharge.   Patient suffers from L LE hematoma which impairs his/her ability to perform daily activities like toileting, feeding, dressing, grooming, bathing in the home. A cane, walker, crutch will not resolve the patient's  issue with performing activities of daily living. A wheelchair is required/recommended and will allow patient to safely perform daily activities.   Patient can safely propel the wheelchair in the home or has a caregiver who can provide assistance.     Follow Up Recommendations  SNF     Equipment Recommendations  Wheelchair (measurements PT);Wheelchair cushion (measurements PT);3in1 (PT);Hospital bed;Rolling walker with 5" wheels    Recommendations for Other Services       Precautions / Restrictions Precautions Precautions: Fall Precaution Comments: Per Dr Bary Castilla, pt has no weight bearing precautions Restrictions Other Position/Activity Restrictions: Per Dr. Bary Castilla, pt has no weight bearing precautions    Mobility  Bed Mobility Overal bed mobility: Needs Assistance Bed Mobility: Supine to Sit     Supine to sit: Mod assist     General bed mobility comments: assist for LE management, truncal elevation and scooting towards edge of bed.  Significant SOB with any exertion  Transfers Overall transfer level: Needs assistance Equipment used: Rolling walker (2 wheeled) Transfers: Sit to/from Stand Sit to Stand: Min assist         General transfer comment: consistent cuing for hand placement and overall mechanics  Ambulation/Gait Ambulation/Gait assistance: Min assist Gait Distance (Feet): 10 Feet Assistive device: Rolling walker (2 wheeled)       General Gait Details: 3-point, step to gait pattern with decreased weight acceptance/stance time L LE.  Slow and deliberate; poor balance, poor overall endurance/activity tolerance.  Question some degree of L ankle PF contracture.  Significant desat to 83-84% with short-distance gait despite incease in [place] O2 to 6L; does recover >90% within 1-2 min seated rest and pursed lip breathing  Stairs             Wheelchair Mobility    Modified Rankin (Stroke Patients Only)       Balance Overall balance assessment: Needs  assistance Sitting-balance support: No upper extremity supported;Feet supported Sitting balance-Leahy Scale: Fair     Standing balance support: Bilateral upper extremity supported Standing balance-Leahy Scale: Poor                              Cognition Arousal/Alertness: Awake/alert Behavior During Therapy: WFL for tasks assessed/performed;Flat affect Overall Cognitive Status: Within Functional Limits for tasks assessed                                 General Comments: Increased encouragement for participation with session      Exercises Other Exercises Other Exercises: Sit/stand x3 with rW, min assist-cuing for hand placement, lift off and standing balance; maintains L LE anterior to BOS at all times, limited weight acceptance/active use with movement transitions    General Comments        Pertinent Vitals/Pain Pain Assessment: Faces Faces Pain Scale: Hurts even more Pain Location: L LE with WBing Pain Descriptors / Indicators: Aching;Grimacing;Guarding;Throbbing Pain Intervention(s): Limited activity within patient's tolerance;Monitored during session;Repositioned;Patient requesting pain meds-RN notified    Home Living                      Prior Function            PT Goals (current goals can now be found in the care plan section) Acute Rehab PT Goals Patient Stated Goal: to go home PT Goal Formulation: With patient Time For Goal Achievement: 11/28/18 Potential to Achieve Goals: Fair Progress towards PT goals: Progressing toward goals    Frequency    Min 2X/week      PT Plan Current plan remains appropriate    Co-evaluation              AM-PAC PT "6 Clicks" Mobility   Outcome Measure  Help needed turning from your back to your side while in a flat bed without using bedrails?: A Lot Help needed moving from lying on your back to sitting on the side of a flat bed without using bedrails?: A Lot Help needed moving  to and from a bed to a chair (including a wheelchair)?: A Lot Help needed standing up from a chair using your arms (e.g., wheelchair or bedside chair)?: A Little Help needed to walk in hospital room?: A Little Help needed climbing 3-5 steps with a railing? : Total 6 Click Score: 13    End of Session Equipment Utilized During Treatment: Gait belt;Oxygen Activity Tolerance: Patient tolerated treatment well;Patient limited by pain Patient left: in chair;with call bell/phone within reach;with chair alarm set;with family/visitor present(patient insistent on up in standard chair (alarm in place, daughter in room); LEs propped on elevated surface for comfort) Nurse Communication: Mobility status PT Visit Diagnosis: Other abnormalities of gait and mobility (R26.89);Muscle weakness (generalized) (M62.81);Difficulty in walking, not elsewhere classified (R26.2);Pain Pain - Right/Left: Left Pain - part of body: Knee;Leg     Time: 1457-1530 PT Time Calculation (min) (ACUTE ONLY): 33 min  Charges:  $Gait Training: 8-22 mins $Therapeutic Activity: 8-22 mins                     Taniah Reinecke H.  Owens Shark, PT, DPT, NCS 11/20/18, 4:39 PM [phone]

## 2018-11-21 LAB — PROTIME-INR
INR: 2.2 — ABNORMAL HIGH (ref 0.8–1.2)
Prothrombin Time: 24.2 seconds — ABNORMAL HIGH (ref 11.4–15.2)

## 2018-11-21 MED ORDER — WARFARIN SODIUM 5 MG PO TABS
5.0000 mg | ORAL_TABLET | Freq: Once | ORAL | Status: AC
  Administered 2018-11-21: 19:00:00 5 mg via ORAL
  Filled 2018-11-21: qty 1

## 2018-11-21 NOTE — Progress Notes (Signed)
PT Cancellation Note  Patient Details Name: [name] MRN: [id number] DOB: 04/21/1935   Cancelled Treatment:    Reason Eval/Treat Not Completed: Patient declined, no reason specified   Session offered but pt flatly refused this am.     Chesley Noon 11/21/2018, 1:14 PM

## 2018-11-21 NOTE — Progress Notes (Signed)
Patient is here for a large hematoma on her left left requiring debridement.  She has since developed 3 new hematoma's on her right leg.  During change of shift report I lifted the pink foam covering one of these hematomas on her right leg to assess it.  She complained that lifting the pink foam caused her severe pain to her leg. She is requesting pain medications at this time because "you hurt my leg when you took that bandage off earlier and I don't want you to touch me again until my daughter gets here".  I tried to explain that I wanted to assess the new hematoma, but she cut me off.  I got her pain medicine.

## 2018-11-21 NOTE — Progress Notes (Signed)
Patient ID: [name], female   DOB: 1934/09/17, 84 y.o.   MRN: [id number] daughter left a note for me to request vascular consultation for recurrent hematoma is now in the right lower extremity. Patient is on warfarin for mitral valve. INR is 2.2. Dr. Harold Barban has been notified

## 2018-11-21 NOTE — Progress Notes (Addendum)
Leg dressing changed.  Old dressing removed down to Adaptic.  Wound covered with Gauze thickly coated with Lubricating jelly.  This was covered with ABDs.  Leg then wrapped with Kerlix and then Ace.  Prior to dressing change, gauze dressing on top of the Adaptic was dry, but not adhered to adaptic.  Came off easily.  Supplies Needed 2 boxes of 4x4 gauze. 3-4 tubes of lubricating jelly 3 ABDs 3 Kerlix 2 Ace wraps

## 2018-11-21 NOTE — Progress Notes (Signed)
[facility] for  Warfarin  Indication: atrial fibrillation, Mechanical Mitral Valve  Allergies  Allergen Reactions  . Ace Inhibitors     Cough  . Doxycycline Nausea Only  . Hydrocodone Itching and Nausea Only  . Mercury     Other reaction(s): Unknown  . Silver Dermatitis    Severe itching  . Cephalexin Rash  . Clindamycin/Lincomycin Rash  . Nickel Rash  . Penicillins Rash    Has patient had a PCN reaction causing immediate rash, facial/tongue/throat swelling, SOB or lightheadedness with hypotension: YES Has patient had a PCN reaction causing severe rash involving mucus membranes or skin necrosis: NO Has patient had a PCN reaction that required hospitalization NO Has patient had a PCN reaction occurring within the last 10 years: NO If all of the above answers are "NO", then may proceed with Cephalosporin use.    Patient Measurements: Height: 4\' 11"  (149.9 cm) Weight: (refused by pt) IBW/kg (Calculated) : 43.2 Heparin Dosing Weight: 62.6 kg  Vital Signs: Temp: 98.4 F (36.9 C) (05/23 0805) Temp Source: Oral (05/23 0516) BP: 100/53 (05/23 0805) Pulse Rate: 62 (05/23 0805)  Labs: Recent Labs    11/19/18 0619 11/20/18 0427 11/21/18 0538  HGB 8.8* 9.0*  --   HCT 29.2* 29.7*  --   PLT 148* 160  --   LABPROT 23.3* 24.0* 24.2*  INR 2.1* 2.2* 2.2*  CREATININE 1.22* 1.31*  --     Estimated Creatinine Clearance: 28.4 mL/min (A) (by C-G formula based on SCr of 1.31 mg/dL (H)).   Medical History: Past Medical History:  Diagnosis Date  . Arthritis    "back, hands, knees" (04/10/2016)  . Chronic atrial fibrillation    a. On Coumadin  . Chronic back pain   . 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 EF 25% by echo in 08/2015 b. RHC in 08/2015 showed normal filling pressures  . Chronic kidney disease   . Compression fracture    "several; all in my back" (04/10/2016)  . GERD  (gastroesophageal reflux disease)   . Hypertension   . Lymphedema   . Pacemaker 05/2017  . S/P MVR (mitral valve replacement)    a. MVR 1994 b. redo MVR in 08/2014 - on Coumadin  . Shortness of breath dyspnea      Assessment: Pharmacy was consulted to start heparin bridge with warfarin. Pt was on warfarin previously for atrial fibrillation with mechanical mitral valve. Per coumadin clinic notes, patient's INR goal is 2.5 - 3.5. Her home evening dose of warfarin is: 6 mg MWF and 3 mg TThSaSun. On admission patient's INR was 3.5 and patient had a hematoma in her leg. Pt went for I&D on 5/13. Vitamin K was given 5/12 and INR has came down to 1.2 for procedure.  Drug interactions: Amiodarone which can increase INR.   Date INR Warfarin Dose  5/11 3.2 Held  5/12 2.8 Held and gave Vitamin K IV  5/13 1.2 6 mg  5/14 1.2 6 mg  5/15 1.3 6 mg  5/16 1.6 6 mg   5/17 1.9 --  5/18 2.3 -- Not given due to pt being lethargic  5/19 2.0 6 mg   5/20 2.4 No dose ordered/charted?  5/21 2.1  5mg   5/22 2.2  4 mg  5/23 2.2  5 mg   Cardiology recommended goal INR around 2.5 given her increased risk of bleeding  Goal of Therapy:  INR 2-3,  given recent bleed (per cardiology). Monitor platelets by anticoagulation protocol: Yes   Plan:   INR 2.2 - Major DDI with amiodarone (home med). DDI w/Cipro- added yesterday 5/21.  INR remains at 2.2. Will order warfarin 5mg  x 1 and obtain INR with am labs.   Pharmacy will continue to monitor and adjust per consult.   Dajae Kizer L, 11/21/2018 12:53 PM

## 2018-11-21 NOTE — Plan of Care (Signed)
  Problem: Clinical Measurements: Goal: Ability to maintain clinical measurements within normal limits will improve Outcome: Progressing   Problem: Activity: Goal: Risk for activity intolerance will decrease Outcome: Progressing   Problem: Pain Managment: Goal: General experience of comfort will improve Outcome: Progressing   Problem: Safety: Goal: Ability to remain free from injury will improve Outcome: Progressing   Problem: Skin Integrity: Goal: Risk for impaired skin integrity will decrease Outcome: Progressing

## 2018-11-21 NOTE — Progress Notes (Signed)
Subjective  -   Resting comfortably in bed   Physical Exam:  Left leg dressing intact Ecchymosis on right leg       Assessment/Plan:    I spoke with the daughter via phone regarding her concerns.  She understands anticoagulation is necessary given her heart valve.  We discussed that she is at risk for future hematoma as well as bruising given her coumadin.  I do not feel she has a underlying vascular condition that would provide a separate explanation for her bruising.  I believe it is all related to her coumadin as well as her skin quality.  I told her that it would be fine to wear compression stocking on her right leg as long as she does not have any open wounds.  She felt satisfied after we talked that all of her questions have been answered.  Please call if we can be of further assistance  [name] 11/21/2018 6:32 PM --  Vitals:   11/21/18 0805 11/21/18 1718  BP: (!) 100/53 118/67  Pulse: 62 67  Resp: 19 18  Temp: 98.4 F (36.9 C)   SpO2: 100% 100%    Intake/Output Summary (Last 24 hours) at 11/21/2018 1832 Last data filed at 11/21/2018 1822 Gross per 24 hour  Intake -  Output 1250 ml  Net -1250 ml     Laboratory CBC    Component Value Date/Time   WBC 5.1 11/20/2018 0427   HGB 9.0 (L) 11/20/2018 0427   HGB 11.9 (L) 03/27/2012 0418   HCT 29.7 (L) 11/20/2018 0427   HCT 36.0 03/27/2012 0418   PLT 160 11/20/2018 0427   PLT 198 03/27/2012 0418    BMET    Component Value Date/Time   NA 139 11/20/2018 0427   NA 139 09/29/2017 1214   NA 143 03/27/2012 0418   K 4.1 11/20/2018 0427   K 3.9 02/20/2015 0630   CL 100 11/20/2018 0427   CL 111 (H) 03/27/2012 0418   CO2 31 11/20/2018 0427   CO2 21 03/27/2012 0418   GLUCOSE 94 11/20/2018 0427   GLUCOSE 96 03/27/2012 0418   BUN 36 (H) 11/20/2018 0427   BUN 55 (H) 09/29/2017 1214   BUN 10 03/27/2012 0418   CREATININE 1.31 (H) 11/20/2018 0427   CREATININE 0.84 03/27/2012 0418   CALCIUM 8.8 (L)  11/20/2018 0427   CALCIUM 8.3 (L) 03/27/2012 0418   GFRNONAA 38 (L) 11/20/2018 0427   GFRNONAA >60 03/27/2012 0418   GFRAA 44 (L) 11/20/2018 0427   GFRAA >60 03/27/2012 0418    COAG Lab Results  Component Value Date   INR 2.2 (H) 11/21/2018   INR 2.2 (H) 11/20/2018   INR 2.1 (H) 11/19/2018   No results found for: PTT  Antibiotics Anti-infectives (From admission, onward)   Start     Dose/Rate Route Frequency Ordered Stop   11/19/18 2000  ciprofloxacin (CIPRO) tablet 250 mg     250 mg Oral 2 times daily 11/19/18 1528     11/19/18 0600  ciprofloxacin (CIPRO) IVPB 400 mg  Status:  Discontinued     400 mg 200 mL/hr over 60 Minutes Intravenous On call to O.R. 11/19/18 0429 11/19/18 1528   11/16/18 1200  aztreonam (AZACTAM) 1 g in sodium chloride 0.9 % 100 mL IVPB  Status:  Discontinued     1 g 200 mL/hr over 30 Minutes Intravenous Every 8 hours 11/16/18 1050 11/20/18 0924   11/16/18 1100  meropenem (MERREM) 500 mg in  sodium chloride 0.9 % 100 mL IVPB  Status:  Discontinued     500 mg 200 mL/hr over 30 Minutes Intravenous Every 12 hours 11/16/18 1001 11/16/18 1050       V. Leia Alf, M.D., [facility] [place] Office: [phone] Pager:  [phone]

## 2018-11-21 NOTE — Progress Notes (Signed)
[place] at [place] NAME: [name]    MR#:  [id number]  DATE OF BIRTH:  1934/08/26  SUBJECTIVE:   Patient is more alert and awake today. Eating better. dter [name] in the room  new small hematoma on the right calf  REVIEW OF SYSTEMS:  Review of Systems  Constitutional: Negative for chills and fever.  HENT: Negative for hearing loss and tinnitus.   Eyes: Negative for blurred vision and double vision.  Respiratory: Negative for cough and shortness of breath.   Cardiovascular: Negative for chest pain and palpitations.  Gastrointestinal: Negative for abdominal pain, heartburn, nausea and vomiting.  Genitourinary: Negative for dysuria and urgency.  Musculoskeletal: Negative for back pain and myalgias.  Skin: Negative for itching and rash.  Neurological: Negative for dizziness and headaches.  Psychiatric/Behavioral: Negative for depression and hallucinations.    DRUG ALLERGIES:   Allergies  Allergen Reactions  . Ace Inhibitors     Cough  . Doxycycline Nausea Only  . Hydrocodone Itching and Nausea Only  . Mercury     Other reaction(s): Unknown  . Silver Dermatitis    Severe itching  . Cephalexin Rash  . Clindamycin/Lincomycin Rash  . Nickel Rash  . Penicillins Rash    Has patient had a PCN reaction causing immediate rash, facial/tongue/throat swelling, SOB or lightheadedness with hypotension: YES Has patient had a PCN reaction causing severe rash involving mucus membranes or skin necrosis: NO Has patient had a PCN reaction that required hospitalization NO Has patient had a PCN reaction occurring within the last 10 years: NO If all of the above answers are "NO", then may proceed with Cephalosporin use.   VITALS:  Blood pressure (!) 100/53, pulse 62, temperature 98.4 F (36.9 C), resp. rate 19, height 4\' 11"  (1.499 m), weight 68.9 kg, SpO2 100 %. PHYSICAL EXAMINATION:   GENERAL:  84 y.o.-year-old elderly patient lying  in the bed with no acute distress.  EYES: Pupils equal, round, reactive to light and accommodation. No scleral icterus. Extraocular muscles intact.  HEENT: Head atraumatic, normocephalic. Oropharynx and nasopharynx clear.  NECK:  Supple, no jugular venous distention. No thyroid enlargement, no tenderness.  LUNGS: Normal breath sounds bilaterally, no wheezing, rhonchi or crepitation.  Decreased bibasilar breath sounds. no use of accessory muscles of respiration.  CARDIOVASCULAR: S1, S2 normal. No  rubs, or gallops.  ABDOMEN: Soft, nontender, nondistended. Bowel sounds present.  EXTREMITIES: No pedal edema, cyanosis, or clubbing.  Left leg in Ace wrap dressingNo active bleeding noticed bruising noticed no evidence of any bleeding clinically.  Good capillary refill on left toes.  Above on the right leg NEUROLOGIC: Awake, alert and oriented x2-3.  Sensation intact. Gait not checked.  Global weakness noted. PSYCHIATRIC: The patient is alert and oriented x 2  SKIN: No obvious rash, lesion, or ulcer.   LABORATORY PANEL:  Female CBC Recent Labs  Lab 11/20/18 0427  WBC 5.1  HGB 9.0*  HCT 29.7*  PLT 160   ------------------------------------------------------------------------------------------------------------------ Chemistries  Recent Labs  Lab 11/16/18 0608  11/20/18 0427  NA 137   < > 139  K 3.7   < > 4.1  CL 98   < > 100  CO2 29   < > 31  GLUCOSE 88   < > 94  BUN 69*   < > 36*  CREATININE 1.70*   < > 1.31*  CALCIUM 8.2*   < > 8.8*  MG 2.4  --   --    < > =  values in this interval not displayed.   RADIOLOGY:  No results found. ASSESSMENT AND PLAN:   84 year old female with past medical history significant for mitral valve replacement with a mechanical valve on chronic Coumadin, atrial fibrillation/flutter status post pacemaker, hypertension, chronic systolic heart failure, lymphedema, GERD and hypertension, pulmonary hypertension on chronic 2 L home oxygen brought from home  secondary to left lower extremity hematoma.  #.  Left lower extremity hematoma-after a trauma while on Coumadin. -Appreciate surgical consult.  Patient had debridement of the left calf hematoma and a biological graft placed in on 11/11/2018. Patient was taken to the OR for dressing change on 11/13/2018.  Dr.Byrnett discussed with [name], DO from plastics.  Patient is s/p Acell placement on 11/19/2018 -RN to follow dressing change per Dr Dwyane Luo recommendation.  -Wound culture with Enterobacter, few basilllus species--changed to po cipro per sensitivity  # Acute blood loss anemia-secondary to bleeding into her hematoma -Received 2 units packed RBC transfusion this admission.  Hemoglobin 7.1 on 11/16/2018. transfuse 1 more unit of blood  hemoglobin at 7.1 with 1 unit of blood transfusion at 8.7--9.2--9.0 -Continue to monitor hemoglobin closely.    # Acute on chronic systolic heart failure exacerbation- Clinically improved.  Denies any shortness of breath. Remains on chronic 2 L oxygen. -Appreciate cardiology input.  Patient has a pacemaker, atrial flutter/fibrillation status post DC cardioversion last week. -Patient already transitioned to torsemide 40 mg p.o. daily.   -No ACE inhibitor due to underlying CKD and relative hypotension.Last echo with EF of 25%  4.  History of mechanical mitral valve surgery-Coumadin has been restarted INR 2.4 discontinued heparin drip, as hemoglobin dropped down to 7.1 we will continue Coumadin and monitor PT/INR -Cardiology recommended goal INR around 2.5 given her increased risk of bleeding  5.  CKD stage IV-Baseline creatinine seems to be around 2.3. Renal function remains fairly stable at this time with creatinine of 1.7-1.4-1.36  6.  Generalized weakness physical therapy recommending skilled nursing facility but daughter [name] a prefers home health PT  I have updated patient's daughter healthcare power of attorney [name] and updated her on  treatment plans as outlined .Marland Kitchen  All questions were answered to her satisfaction Family would like to take her home with [facility] at discharge.  Management plans discussed with the patient, daughter at bedside and daughter is in agreement.  CODE STATUS: Full Code  TOTAL TIME TAKING CARE OF THIS PATIENT: 25 minutes.   More than 50% of the time was spent in counseling/coordination of care: YES  POSSIBLE D/C IN ? DAYS, DEPENDING ON CLINICAL CONDITION.   Fritzi Mandes M.D on 11/21/2018 at 9:47 AM  Between 7am to 6pm - Pager - [phone]  After 6pm go to [email or web address] - Proofreader  [facility]  Office  [phone]  CC: Primary care physician; Rusty Aus, MD  Note: This dictation was prepared with Dragon dictation along with smaller phrase technology. Any transcriptional errors that result from this process are unintentional.

## 2018-11-22 LAB — CBC
HCT: 28.8 % — ABNORMAL LOW (ref 36.0–46.0)
Hemoglobin: 8.6 g/dL — ABNORMAL LOW (ref 12.0–15.0)
MCH: 27.9 pg (ref 26.0–34.0)
MCHC: 29.9 g/dL — ABNORMAL LOW (ref 30.0–36.0)
MCV: 93.5 fL (ref 80.0–100.0)
Platelets: 181 10*3/uL (ref 150–400)
RBC: 3.08 MIL/uL — ABNORMAL LOW (ref 3.87–5.11)
RDW: 16.4 % — ABNORMAL HIGH (ref 11.5–15.5)
WBC: 5.3 10*3/uL (ref 4.0–10.5)
nRBC: 0 % (ref 0.0–0.2)

## 2018-11-22 LAB — PROTIME-INR
INR: 2.1 — ABNORMAL HIGH (ref 0.8–1.2)
Prothrombin Time: 23.6 seconds — ABNORMAL HIGH (ref 11.4–15.2)

## 2018-11-22 MED ORDER — WARFARIN SODIUM 6 MG PO TABS
6.0000 mg | ORAL_TABLET | Freq: Once | ORAL | Status: AC
  Administered 2018-11-22: 6 mg via ORAL
  Filled 2018-11-22: qty 1

## 2018-11-22 NOTE — Plan of Care (Signed)
  Problem: Clinical Measurements: Goal: Ability to maintain clinical measurements within normal limits will improve Outcome: Progressing Goal: Respiratory complications will improve Outcome: Progressing Goal: Cardiovascular complication will be avoided Outcome: Progressing   Problem: Pain Managment: Goal: General experience of comfort will improve Outcome: Progressing

## 2018-11-22 NOTE — Progress Notes (Signed)
[place] at [place] NAME: [name]    MR#:  [id number]  DATE OF BIRTH:  23-Sep-1934  SUBJECTIVE:   No new complaints Ate good BF  REVIEW OF SYSTEMS:  Review of Systems  Constitutional: Negative for chills and fever.  HENT: Negative for hearing loss and tinnitus.   Eyes: Negative for blurred vision and double vision.  Respiratory: Negative for cough and shortness of breath.   Cardiovascular: Negative for chest pain and palpitations.  Gastrointestinal: Negative for abdominal pain, heartburn, nausea and vomiting.  Genitourinary: Negative for dysuria and urgency.  Musculoskeletal: Negative for back pain and myalgias.  Skin: Negative for itching and rash.  Neurological: Negative for dizziness and headaches.  Psychiatric/Behavioral: Negative for depression and hallucinations.    DRUG ALLERGIES:   Allergies  Allergen Reactions  . Ace Inhibitors     Cough  . Doxycycline Nausea Only  . Hydrocodone Itching and Nausea Only  . Mercury     Other reaction(s): Unknown  . Silver Dermatitis    Severe itching  . Cephalexin Rash  . Clindamycin/Lincomycin Rash  . Nickel Rash  . Penicillins Rash    Has patient had a PCN reaction causing immediate rash, facial/tongue/throat swelling, SOB or lightheadedness with hypotension: YES Has patient had a PCN reaction causing severe rash involving mucus membranes or skin necrosis: NO Has patient had a PCN reaction that required hospitalization NO Has patient had a PCN reaction occurring within the last 10 years: NO If all of the above answers are "NO", then may proceed with Cephalosporin use.   VITALS:  Blood pressure (!) 109/58, pulse 63, temperature 98 F (36.7 C), temperature source Oral, resp. rate 20, height 4\' 11"  (1.499 m), weight 68.9 kg, SpO2 96 %. PHYSICAL EXAMINATION:   GENERAL:  84 y.o.-year-old elderly patient lying in the bed with no acute distress.  EYES: Pupils equal, round,  reactive to light and accommodation. No scleral icterus. Extraocular muscles intact.  HEENT: Head atraumatic, normocephalic. Oropharynx and nasopharynx clear.  NECK:  Supple, no jugular venous distention. No thyroid enlargement, no tenderness.  LUNGS: Normal breath sounds bilaterally, no wheezing, rhonchi or crepitation.  Decreased bibasilar breath sounds. no use of accessory muscles of respiration.  CARDIOVASCULAR: S1, S2 normal. No  rubs, or gallops.  ABDOMEN: Soft, nontender, nondistended. Bowel sounds present.  EXTREMITIES: No pedal edema, cyanosis, or clubbing.  Left leg in Ace wrap dressingNo active bleeding noticed bruising noticed no evidence of any bleeding clinically.  Good capillary refill on left toes.  Above on the right leg NEUROLOGIC: Awake, alert and oriented x2-3.  Sensation intact. Gait not checked.  Global weakness noted. PSYCHIATRIC: The patient is alert and oriented x 2  SKIN: No obvious rash, lesion, or ulcer.   LABORATORY PANEL:  Female CBC Recent Labs  Lab 11/22/18 0628  WBC 5.3  HGB 8.6*  HCT 28.8*  PLT 181   ------------------------------------------------------------------------------------------------------------------ Chemistries  Recent Labs  Lab 11/16/18 0608  11/20/18 0427  NA 137   < > 139  K 3.7   < > 4.1  CL 98   < > 100  CO2 29   < > 31  GLUCOSE 88   < > 94  BUN 69*   < > 36*  CREATININE 1.70*   < > 1.31*  CALCIUM 8.2*   < > 8.8*  MG 2.4  --   --    < > = values in this interval not displayed.  RADIOLOGY:  No results found. ASSESSMENT AND PLAN:   84 year old female with past medical history significant for mitral valve replacement with a mechanical valve on chronic Coumadin, atrial fibrillation/flutter status post pacemaker, hypertension, chronic systolic heart failure, lymphedema, GERD and hypertension, pulmonary hypertension on chronic 2 L home oxygen brought from home secondary to left lower extremity hematoma.  #.  Left lower  extremity hematoma-after a trauma while on Coumadin. -Appreciate surgical consult.  Patient had debridement of the left calf hematoma and a biological graft placed in on 11/11/2018. Patient was taken to the OR for dressing change on 11/13/2018.  [name] discussed with Carrie Sabal, DO from plastics.  Patient is s/p Acell placement on 11/19/2018 -RN to follow dressing change per [name] recommendation.  -Wound culture with Enterobacter, few basilllus species--changed to po cipro per sensitivity -Seen by [name] per dter's request  # Acute blood loss anemia-secondary to bleeding into her hematoma -Received 2 units packed RBC transfusion this admission.  Hemoglobin 7.1 on 11/16/2018. transfuse 1 more unit of blood  hemoglobin at 7.1 with 1 unit of blood transfusion at 8.7--9.2--9.0  # Acute on chronic systolic heart failure exacerbation- Clinically improved.  Denies any shortness of breath. Remains on chronic 2 L oxygen. -Appreciate cardiology input.  Patient has a pacemaker, atrial flutter/fibrillation status post DC cardioversion last week. -Patient already transitioned to torsemide 40 mg p.o. daily.   -No ACE inhibitor due to underlying CKD and relative hypotension.Last echo with EF of 25%  4.  History of mechanical mitral valve surgery-Coumadin has been restarted INR 2.4 discontinued heparin drip, as hemoglobin dropped down to 7.1 we will continue Coumadin and monitor PT/INR -Cardiology recommended goal INR around 2.5 given her increased risk of bleeding  5.  CKD stage IV-Baseline creatinine seems to be around 2.3. Renal function remains fairly stable at this time with creatinine of 1.7-1.4-1.36  6.  Generalized weakness physical therapy recommending skilled nursing facility but daughter [name] a prefers home health PT  Family would like to take her home with [facility] at discharge.   CODE STATUS: Full Code  TOTAL TIME TAKING CARE OF THIS PATIENT: 25 minutes.   More than  50% of the time was spent in counseling/coordination of care: YES  POSSIBLE D/C IN ? DAYS, DEPENDING ON CLINICAL CONDITION.   [name] M.D on 11/22/2018 at 12:43 PM  Between 7am to 6pm - Pager - [phone]  After 6pm go to [email or web address] - Proofreader  [facility]  Office  [phone]  CC: Primary care physician; Rusty Aus, MD  Note: This dictation was prepared with Dragon dictation along with smaller phrase technology. Any transcriptional errors that result from this process are unintentional.

## 2018-11-22 NOTE — Anesthesia Postprocedure Evaluation (Signed)
Anesthesia Post Note  Patient: [name]  Procedure(s) Performed: LEFT LEG DRESSING CHANGE AND GRAFT APPLICATION (Left )  Patient location during evaluation: PACU Anesthesia Type: General Level of consciousness: awake and alert and oriented Pain management: pain level controlled Vital Signs Assessment: post-procedure vital signs reviewed and stable Respiratory status: spontaneous breathing Cardiovascular status: blood pressure returned to baseline Anesthetic complications: no     Last Vitals:  Vitals:   11/22/18 0636 11/22/18 0819  BP: (!) 117/54 (!) 109/58  Pulse: 63 63  Resp: 18 20  Temp: 36.7 C 36.7 C  SpO2: 95% 96%    Last Pain:  Vitals:   11/22/18 0904  TempSrc:   PainSc: 5                  Kemond Amorin

## 2018-11-22 NOTE — Progress Notes (Signed)
[place] for  Warfarin  Indication: atrial fibrillation, Mechanical Mitral Valve  Allergies  Allergen Reactions  . Ace Inhibitors     Cough  . Doxycycline Nausea Only  . Hydrocodone Itching and Nausea Only  . Mercury     Other reaction(s): Unknown  . Silver Dermatitis    Severe itching  . Cephalexin Rash  . Clindamycin/Lincomycin Rash  . Nickel Rash  . Penicillins Rash    Has patient had a PCN reaction causing immediate rash, facial/tongue/throat swelling, SOB or lightheadedness with hypotension: YES Has patient had a PCN reaction causing severe rash involving mucus membranes or skin necrosis: NO Has patient had a PCN reaction that required hospitalization NO Has patient had a PCN reaction occurring within the last 10 years: NO If all of the above answers are "NO", then may proceed with Cephalosporin use.    Patient Measurements: Height: 4\' 11"  (149.9 cm) Weight: (refused by pt) IBW/kg (Calculated) : 43.2 Heparin Dosing Weight: 62.6 kg  Vital Signs: Temp: 98 F (36.7 C) (05/24 0819) Temp Source: Oral (05/24 0819) BP: 109/58 (05/24 0819) Pulse Rate: 63 (05/24 0819)  Labs: Recent Labs    11/20/18 0427 11/21/18 0538 11/22/18 0628  HGB 9.0*  --  8.6*  HCT 29.7*  --  28.8*  PLT 160  --  181  LABPROT 24.0* 24.2* 23.6*  INR 2.2* 2.2* 2.1*  CREATININE 1.31*  --   --     Estimated Creatinine Clearance: 28.4 mL/min (A) (by C-G formula based on SCr of 1.31 mg/dL (H)).   Medical History: Past Medical History:  Diagnosis Date  . Arthritis    "back, hands, knees" (04/10/2016)  . Chronic atrial fibrillation    a. On Coumadin  . Chronic back pain   . 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 EF 25% by echo in 08/2015 b. RHC in 08/2015 showed normal filling pressures  . Chronic kidney disease   . Compression fracture    "several; all in my back" (04/10/2016)  . GERD  (gastroesophageal reflux disease)   . Hypertension   . Lymphedema   . Pacemaker 05/2017  . S/P MVR (mitral valve replacement)    a. MVR 1994 b. redo MVR in 08/2014 - on Coumadin  . Shortness of breath dyspnea      Assessment: Pharmacy was consulted to start heparin bridge with warfarin. Pt was on warfarin previously for atrial fibrillation with mechanical mitral valve. Per coumadin clinic notes, patient's INR goal is 2.5 - 3.5. Her home evening dose of warfarin is: 6 mg MWF and 3 mg TThSaSun. On admission patient's INR was 3.5 and patient had a hematoma in her leg. Pt went for I&D on 5/13. Vitamin K was given 5/12 and INR has came down to 1.2 for procedure.  Drug interactions: Amiodarone which can increase INR.   Date INR Warfarin Dose  5/11 3.2 Held  5/12 2.8 Held and gave Vitamin K IV  5/13 1.2 6 mg  5/14 1.2 6 mg  5/15 1.3 6 mg  5/16 1.6 6 mg   5/17 1.9 --  5/18 2.3 -- Not given due to pt being lethargic  5/19 2.0 6 mg   5/20 2.4 No dose ordered/charted?  5/21 2.1  5mg   8/24 2.2  4 mg  5/23 2.2  5 mg  5/24 2.1 6 mg   Cardiology recommended goal INR around 2.5 given her increased risk of bleeding  Goal of Therapy:  INR 2-3, given recent bleed (per cardiology). Monitor platelets by anticoagulation protocol: Yes   Plan:   INR 2.1 - Major DDI with amiodarone (home med). DDI w/Cipro- added 5/21.  INR slightly decreased. Will order warfarin 6mg  PO x 1 and recheck INR with am labs.   Pharmacy will continue to monitor and adjust per consult.   Julliana Whitmyer L, 11/22/2018 9:17 AM

## 2018-11-22 NOTE — Progress Notes (Signed)
PT Cancellation Note  Patient Details Name: JESSACA PHILIPPI MRN: [id number] DOB: 05-29-1935   Cancelled Treatment:    Reason Eval/Treat Not Completed: Patient at procedure or test/unavailable(Treatment session attempted.  Patient currently with nursing for bedside dressing change, unavailable for participation with session.  Will re-attempt at later time/date as medically appropriate and available.)   Chael Urenda H. Owens Shark, PT, DPT, NCS 11/22/18, 3:10 PM [phone]

## 2018-11-23 LAB — PROTIME-INR
INR: 2.4 — ABNORMAL HIGH (ref 0.8–1.2)
Prothrombin Time: 25.8 seconds — ABNORMAL HIGH (ref 11.4–15.2)

## 2018-11-23 MED ORDER — WARFARIN SODIUM 6 MG PO TABS
6.0000 mg | ORAL_TABLET | Freq: Once | ORAL | Status: AC
  Administered 2018-11-23: 18:00:00 6 mg via ORAL
  Filled 2018-11-23: qty 1

## 2018-11-23 NOTE — Progress Notes (Signed)
[place] for  Warfarin  Indication: atrial fibrillation, Mechanical Mitral Valve  Allergies  Allergen Reactions  . Ace Inhibitors     Cough  . Doxycycline Nausea Only  . Hydrocodone Itching and Nausea Only  . Mercury     Other reaction(s): Unknown  . Silver Dermatitis    Severe itching  . Cephalexin Rash  . Clindamycin/Lincomycin Rash  . Nickel Rash  . Penicillins Rash    Has patient had a PCN reaction causing immediate rash, facial/tongue/throat swelling, SOB or lightheadedness with hypotension: YES Has patient had a PCN reaction causing severe rash involving mucus membranes or skin necrosis: NO Has patient had a PCN reaction that required hospitalization NO Has patient had a PCN reaction occurring within the last 10 years: NO If all of the above answers are "NO", then may proceed with Cephalosporin use.    Patient Measurements: Height: 4\' 11"  (149.9 cm) Weight: 149 lb 3.2 oz (67.7 kg) IBW/kg (Calculated) : 43.2 Heparin Dosing Weight: 62.6 kg  Vital Signs: Temp: 97.5 F (36.4 C) (05/25 0808) Temp Source: Oral (05/25 0808) BP: 130/60 (05/25 0808) Pulse Rate: 60 (05/25 0808)  Labs: Recent Labs    11/21/18 0538 11/22/18 0628 11/23/18 0634  HGB  --  8.6*  --   HCT  --  28.8*  --   PLT  --  181  --   LABPROT 24.2* 23.6* 25.8*  INR 2.2* 2.1* 2.4*    Estimated Creatinine Clearance: 28.2 mL/min (A) (by C-G formula based on SCr of 1.31 mg/dL (H)).   Medical History: Past Medical History:  Diagnosis Date  . Arthritis    "back, hands, knees" (04/10/2016)  . Chronic atrial fibrillation    a. On Coumadin  . Chronic back pain   . 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 EF 25% by echo in 08/2015 b. RHC in 08/2015 showed normal filling pressures  . Chronic kidney disease   . Compression fracture    "several; all in my back" (04/10/2016)  . GERD (gastroesophageal reflux  disease)   . Hypertension   . Lymphedema   . Pacemaker 05/2017  . S/P MVR (mitral valve replacement)    a. MVR 1994 b. redo MVR in 08/2014 - on Coumadin  . Shortness of breath dyspnea      Assessment: Pharmacy was consulted to start heparin bridge with warfarin. Pt was on warfarin previously for atrial fibrillation with mechanical mitral valve. Per coumadin clinic notes, patient's INR goal is 2.5 - 3.5. Her home evening dose of warfarin is: 6 mg MWF and 3 mg TThSaSun. On admission patient's INR was 3.5 and patient had a hematoma in her leg. Pt went for I&D on 5/13. Vitamin K was given 5/12 and INR has came down to 1.2 for procedure.  Drug interactions: Amiodarone which can increase INR.   Date INR Warfarin Dose  5/11 3.2 Held  5/12 2.8 Held and gave Vitamin K IV  5/13 1.2 6 mg  5/14 1.2 6 mg  5/15 1.3 6 mg  5/16 1.6 6 mg   5/17 1.9 --  5/18 2.3 -- Not given due to pt being lethargic  5/19 2.0 6 mg   5/20 2.4 No dose ordered/charted?  5/21 2.1  5mg   5/22 2.2  4 mg  5/23 2.2  5 mg  5/24 2.1 6 mg  5/25 2.4    Cardiology recommended goal INR around 2.5 given her  increased risk of bleeding  Goal of Therapy:  INR 2-3, given recent bleed (per cardiology). Monitor platelets by anticoagulation protocol: Yes   Plan:   INR 2.4 - Major DDI with amiodarone (home med). DDI w/Cipro- added 5/21.  Will order warfarin 6mg  PO x 1 again tonight and recheck INR with am labs.   Pharmacy will continue to monitor and adjust per consult.   Jolina Symonds A, 11/23/2018 11:06 AM

## 2018-11-23 NOTE — Progress Notes (Signed)
Patient and daughter requesting that patient receive physical therapy.  Order for PT put in today but explained since it is a holiday, PT may not be initiated until tomorrow.   Spoke with daughter in regards to dressing changes changing from daily to every other day.  She had a lot of questions regarding this decision and I told her that Dr. Marla Roe will be following up to speak to her directly.  Dressing change was last done on 11/22/18 at 1600 and according to new orders will not be done again until 11/24/18 at 1600 unless Dr. Marla Roe requests otherwise.

## 2018-11-23 NOTE — Plan of Care (Signed)
  Problem: Education: Goal: Knowledge of General Education information will improve Description Including pain rating scale, medication(s)/side effects and non-pharmacologic comfort measures Outcome: Progressing   Problem: Clinical Measurements: Goal: Ability to maintain clinical measurements within normal limits will improve Outcome: Progressing Goal: Respiratory complications will improve Outcome: Progressing   Problem: Pain Managment: Goal: General experience of comfort will improve Outcome: Progressing   Problem: Safety: Goal: Ability to remain free from injury will improve Outcome: Progressing   Problem: Skin Integrity: Goal: Risk for impaired skin integrity will decrease Outcome: Progressing

## 2018-11-23 NOTE — TOC Progression Note (Signed)
Transition of Care [facility]) - Progression Note    Patient Details  Name: [name] MRN: [id number] Date of Birth: December 19, 1934  Transition of Care [facility]) CM/SW Contact  [name],  Phone Number: 11/23/2018, 4:27 PM  Clinical Narrative:     CSW spoke with patient's daughter who was at bedside and also spoke to patient's son via phone.  Patient's daughter was explained how insurance will pay for stay, and what to expect a SNF along with the benefits of going.  Patient's daughter was informed that based on PT recommendations they say she should go to SNF.  Patient's daughter expressed frustration that patient has deteriorated and will consider SNF if she has too.  CSW informed patient's daughter that if home health comes to the house, they will not be there 24/7  Patient's daughter expressed understanding and was provided a list of SNF in [place] and [place].  1:12pm  CSW received phone call from patient's son [name], [phone], he expressed frustrations with patient's progress and how she is not getting better as quickly as he would like.  Patient's son stated that he has been in discussion with Veterinary surgeon with concerns that he had.  CSW encouraged him to call her again and discuss issues that he has.  Patient's son expressed how he feels patient needs more therapy in the hospital, CSW explained to him that typically therapy does not see patient daily.  CSW explained to him that therapy in the hospital mainly evaluates patients and gives recommendation, then patient would go to SNF for short term rehab to get more intense therapy.  Patient's son expressed concern with current covid 55 outbreak, that he does not feel comfortable for patient to go to SNF and he wants her to stay in hospital to get therapy.  Patient's son stated he wanted to meet in person with [name], and the multidisciplinary team to discuss patient's care.  CSW deferred him to speak to [facility] so she can  see what can be arranged.      Expected Discharge Plan: [place] Barriers to Discharge: Continued Medical Work up  Expected Discharge Plan and Services Expected Discharge Plan: [place]   Discharge Planning Services: CM Consult Post Acute Care Choice: [place] arrangements for the past 2 months: Single Family Home                           HH Arranged: RN, PT [facility] Agency: [place] ([facility]) Date HH Agency Contacted: 11/09/18 Time [place]: 1428 Representative spoke with at [place]: [place] ([place]) Interventions    Readmission Risk Interventions Readmission Risk Prevention Plan 11/09/2018  Transportation Screening Complete  PCP or Specialist Appt within 3-5 Days Complete  HRI or [place] Complete  Social Work Consult for [place] Planning/Counseling Not Complete  SW consult not completed comments na  Palliative Care Screening Not Applicable  Medication Review Press photographer) Complete  Some recent data might be hidden

## 2018-11-23 NOTE — Progress Notes (Addendum)
Patient ID: [name], female   DOB: [birth year], 84 y.o.   MRN: [id number] discussed with Dr. Marla Roe plastic surgery patient does not have any planned procedure in the OR for Wednesday. Dressing changes every other day. She will call and speak with daughter [name]. Patient will need follow-up in 2 to 3 weeks with plastic surgery. Okay for discharge from plastic surgery/surgery standpoint. Will discharge hoping tomorrow.

## 2018-11-23 NOTE — Progress Notes (Addendum)
Daughter just arrived to the floor and stated she had a missed call from Dr. Marla Roe.  She asked if she might be able to speak to her.  Text paged the MD but MD goes off call at 5pm so its unlikely to get a call back at this time of the day.    Addendum: MD stated she spoke with daughter.

## 2018-11-23 NOTE — Progress Notes (Signed)
[name] at [place] NAME: [name]    MR#:  [id number]  DATE OF BIRTH:  Sep 16, 1934  SUBJECTIVE:   Leg pain Ate good BF  REVIEW OF SYSTEMS:  Review of Systems  Constitutional: Negative for chills and fever.  HENT: Negative for hearing loss and tinnitus.   Eyes: Negative for blurred vision and double vision.  Respiratory: Negative for cough and shortness of breath.   Cardiovascular: Negative for chest pain and palpitations.  Gastrointestinal: Negative for abdominal pain, heartburn, nausea and vomiting.  Genitourinary: Negative for dysuria and urgency.  Musculoskeletal: Negative for back pain and myalgias.  Skin: Negative for itching and rash.  Neurological: Negative for dizziness and headaches.  Psychiatric/Behavioral: Negative for depression and hallucinations.    DRUG ALLERGIES:   Allergies  Allergen Reactions  . Ace Inhibitors     Cough  . Doxycycline Nausea Only  . Hydrocodone Itching and Nausea Only  . Mercury     Other reaction(s): Unknown  . Silver Dermatitis    Severe itching  . Cephalexin Rash  . Clindamycin/Lincomycin Rash  . Nickel Rash  . Penicillins Rash    Has patient had a PCN reaction causing immediate rash, facial/tongue/throat swelling, SOB or lightheadedness with hypotension: YES Has patient had a PCN reaction causing severe rash involving mucus membranes or skin necrosis: NO Has patient had a PCN reaction that required hospitalization NO Has patient had a PCN reaction occurring within the last 10 years: NO If all of the above answers are "NO", then may proceed with Cephalosporin use.   VITALS:  Blood pressure 130/60, pulse 60, temperature (!) 97.5 F (36.4 C), temperature source Oral, resp. rate 18, height 4\' 11"  (1.499 m), weight 67.7 kg, SpO2 95 %. PHYSICAL EXAMINATION:   GENERAL:  84 y.o.-year-old elderly patient lying in the bed with no acute distress.  EYES: Pupils equal, round,  reactive to light and accommodation. No scleral icterus. Extraocular muscles intact.  HEENT: Head atraumatic, normocephalic. Oropharynx and nasopharynx clear.  NECK:  Supple, no jugular venous distention. No thyroid enlargement, no tenderness.  LUNGS: Normal breath sounds bilaterally, no wheezing, rhonchi or crepitation.  Decreased bibasilar breath sounds. no use of accessory muscles of respiration.  CARDIOVASCULAR: S1, S2 normal. No  rubs, or gallops.  ABDOMEN: Soft, nontender, nondistended. Bowel sounds present.  EXTREMITIES: No pedal edema, cyanosis, or clubbing.  Left leg in Ace wrap dressingNo active bleeding noticed bruising noticed no evidence of any bleeding clinically.  Good capillary refill on left toes.  Above on the right leg NEUROLOGIC: Awake, alert and oriented x2-3.  Sensation intact. Gait not checked.  Global weakness noted. PSYCHIATRIC: The patient is alert and oriented x 2  SKIN: No obvious rash, lesion, or ulcer.   LABORATORY PANEL:  Female CBC Recent Labs  Lab 11/22/18 0628  WBC 5.3  HGB 8.6*  HCT 28.8*  PLT 181   ------------------------------------------------------------------------------------------------------------------ Chemistries  Recent Labs  Lab 11/20/18 0427  NA 139  K 4.1  CL 100  CO2 31  GLUCOSE 94  BUN 36*  CREATININE 1.31*  CALCIUM 8.8*   RADIOLOGY:  No results found. ASSESSMENT AND PLAN:   84 year old female with past medical history significant for mitral valve replacement with a mechanical valve on chronic Coumadin, atrial fibrillation/flutter status post pacemaker, hypertension, chronic systolic heart failure, lymphedema, GERD and hypertension, pulmonary hypertension on chronic 2 L home oxygen brought from home secondary to left lower extremity hematoma.  # Left lower  extremity hematoma-after a trauma while on Coumadin. -Appreciate surgical consult.  Patient had debridement of the left calf hematoma and a biological graft placed in  on 11/11/2018. Patient was taken to the OR for dressing change on 11/13/2018.  Dr.Byrnett discussed with [name], DO from plastics.  Patient is s/p Acell placement on 11/19/2018 -RN to follow dressing change per Dr Dwyane Luo recommendation.  -Wound culture with Enterobacter, few basilllus species--changed to po cipro per sensitivity -Seen by Dr Trula Slade per dter's request -will d/w dr Marla Roe if pt is on schedule for any further dressing change in OR for Wednesday (dter reported)  # Acute blood loss anemia-secondary to bleeding into her hematoma -Received 2 units packed RBC transfusion this admission.  Hemoglobin 7.1 on 11/16/2018. transfuse 1 more unit of blood  hemoglobin at 7.1 with 1 unit of blood transfusion at 8.7--9.2--9.0-8.6  # Acute on chronic systolic heart failure exacerbation- Clinically improved.  Denies any shortness of breath. Remains on chronic 3 L oxygen. -  Patient has a pacemaker, atrial flutter/fibrillation status post DC cardioversion recently -Patient already transitioned to torsemide 40 mg p.o. daily.   -No ACE inhibitor due to underlying CKD and relative hypotension.Last echo with EF of 25%  4.  History of mechanical mitral valve surgery-Coumadin has been restarted INR 2.4 discontinued heparin drip, as hemoglobin dropped down to 7.1 we will continue Coumadin and monitor PT/INR -Cardiology recommended goal INR around 2.5 given her increased risk of bleeding  5.  CKD stage IV-Baseline creatinine seems to be around 2.3. Renal function remains fairly stable at this time with creatinine of 1.7-1.4-1.36  6.  Generalized weakness physical therapy recommending skilled nursing facility but daughter [name] a prefers home health PT  Family would like to take her [facility] with [facility] at discharge.   CODE STATUS: Full Code  TOTAL TIME TAKING CARE OF THIS PATIENT: 25 minutes.   More than 50% of the time was spent in counseling/coordination of care: YES  POSSIBLE D/C  IN ? DAYS, DEPENDING ON CLINICAL CONDITION.   Fritzi Mandes M.D on 11/23/2018 at 8:49 AM  Between 7am to 6pm - Pager - [phone]  After 6pm go to [email or web address] - Proofreader  [facility]  Office  [phone]  CC: Primary care physician; Rusty Aus, MD  Note: This dictation was prepared with Dragon dictation along with smaller phrase technology. Any transcriptional errors that result from this process are unintentional.

## 2018-11-23 NOTE — NC FL2 (Signed)
[place] LEVEL OF CARE SCREENING TOOL     IDENTIFICATION  Patient Name: [name] Birthdate: 1935-06-27 Sex: female Admission Date (Current Location): 11/09/2018  [place] and [place] Number:  Engineering geologist and Address:  [facility], [address] [place], [place] [address]      Provider Number: [id number]  Attending Physician Name and Address:  Fritzi Mandes, MD  Relative Name and Phone Number:  [name],[name] Daughter   [phone] or [place], [place]   [phone]     Current Level of Care: Hospital Recommended Level of Care: [place] Prior Approval Number:    Date Approved/Denied:   PASRR Number: [id number]  Discharge Plan: SNF    Current Diagnoses: Patient Active Problem List   Diagnosis Date Noted  . Persistent atrial fibrillation   . Atrial tachycardia ([place])   . Chronic kidney disease (CKD), stage IV (severe) ([place])   . Chest pain of uncertain etiology   . Hematoma 11/09/2018  . Acute exacerbation of CHF (congestive heart failure) ([place]) 11/04/2018  . Pulmonary hypertension, unspecified ([place]) 11/04/2018  . S/P MVR (mitral valve replacement) 11/04/2018  . Encounter for therapeutic drug monitoring 09/09/2017  . Encounter for anticoagulation discussion and counseling 09/09/2017  . Paroxysmal atrial fibrillation ([place]) 02/25/2017  . Chronic venous insufficiency 10/27/2016  . Shortness of breath 08/14/2016  . Pressure injury of skin 04/12/2016  . Malnutrition of moderate degree 04/11/2016  . Vertebral compression fracture ([place]) 04/10/2016  . Intractable pain 04/10/2016  . Spinal compression fracture ([place]) 04/10/2016  . Acute on chronic HFrEF (heart failure with reduced ejection fraction) ([place]) 11/19/2015  . Lymphedema   . CKD (chronic kidney disease), stage III ([place])   . Bilateral lower extremity edema 08/30/2015  . Acute on chronic renal failure ([place]) 04/28/2015  . Hyponatremia 04/28/2015   . History of mitral valve replacement with mechanical valve 11/04/2014  . Long term current use of anticoagulant therapy 09/28/2014  . 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 Generalized OA 11/28/2013  . HLD (hyperlipidemia) 11/28/2013    Orientation RESPIRATION BLADDER Height & Weight     Self, Time, Situation, Place  O2(4L) Incontinent Weight: 149 lb 3.2 oz (67.7 kg) Height:  4\' 11"  (149.9 cm)  BEHAVIORAL SYMPTOMS/MOOD NEUROLOGICAL BOWEL NUTRITION STATUS      Incontinent Diet(Regular diet)  AMBULATORY STATUS COMMUNICATION OF NEEDS Skin   Limited Assist Verbally Surgical wounds                       Personal Care Assistance Level of Assistance  Bathing, Feeding, Dressing Bathing Assistance: Limited assistance Feeding assistance: Independent Dressing Assistance: Limited assistance     Functional Limitations Info  Sight, Hearing, Speech Sight Info: Adequate Hearing Info: Adequate Speech Info: Adequate    SPECIAL CARE FACTORS FREQUENCY  PT (By licensed PT), OT (By licensed OT)     PT Frequency: Minimum 5x a week OT Frequency: minimum 5x a week            Contractures Contractures Info: Not present    Additional Factors Info  Code Status, Allergies Code Status Info: Full Code Allergies Info: ACE INHIBITORS, DOXYCYCLINE, HYDROCODONE, MERCURY, SILVER, CEPHALEXIN, CLINDAMYCIN/LINCOMYCIN, NICKEL, PENICILLINS           Current Medications (11/23/2018):  This is the current hospital active medication list Current Facility-Administered Medications  Medication Dose Route Frequency Provider Last Rate Last Dose  . 0.9 %  sodium chloride infusion  250 mL Intravenous  PRN Wallace Going, DO 10 mL/hr at 11/20/18 0525 250 mL at 11/20/18 0525  . acetaminophen (TYLENOL) tablet 650 mg  650 mg Oral Q6H PRN [name], Loel Lofty, DO   650 mg at 11/23/18 1539   Or  . acetaminophen (TYLENOL) suppository 650 mg  650 mg Rectal Q6H PRN [name], Loel Lofty, DO       . acidophilus (RISAQUAD) capsule 1 capsule  1 capsule Oral BID Robert Bellow, MD   1 capsule at 11/23/18 1003  . albuterol (PROVENTIL) (2.5 MG/3ML) 0.083% nebulizer solution 2.5 mg  2.5 mg Nebulization Q6H PRN [name], Loel Lofty, DO   2.5 mg at 11/19/18 0849  . amiodarone (PACERONE) tablet 200 mg  200 mg Oral Daily [name], Claire S, DO   200 mg at 11/23/18 1004  . cholecalciferol (VITAMIN D) tablet 2,000 Units  2,000 Units Oral Daily [name], Claire S, DO   2,000 Units at 11/23/18 1003  . ciprofloxacin (CIPRO) tablet 250 mg  250 mg Oral BID Robert Bellow, MD   250 mg at 11/23/18 2248  . feeding supplement (ENSURE ENLIVE) (ENSURE ENLIVE) liquid 237 mL  237 mL Oral BID BM [name], Claire S, DO   237 mL at 11/20/18 1528  . fentaNYL (SUBLIMAZE) injection 25 mcg  25 mcg Intravenous Q2H PRN [name], Loel Lofty, DO   25 mcg at 11/13/18 1322  . lactulose (CHRONULAC) 10 GM/15ML solution 30 g  30 g Oral Daily PRN [name], Loel Lofty, DO      . multivitamin with minerals tablet 1 tablet  1 tablet Oral Daily [name], Loel Lofty, DO   1 tablet at 11/23/18 1004  . ondansetron (ZOFRAN) tablet 4 mg  4 mg Oral Q6H PRN [name], Loel Lofty, DO       Or  . ondansetron (ZOFRAN) injection 4 mg  4 mg Intravenous Q6H PRN [name], Claire S, DO   4 mg at 11/10/18 1116  . oxyCODONE (Oxy IR/ROXICODONE) immediate release tablet 2.5 mg  2.5 mg Oral Q4H PRN [name], Loel Lofty, DO   2.5 mg at 11/22/18 1507  . polyethylene glycol (MIRALAX / GLYCOLAX) packet 17 g  17 g Oral Daily [name], Claire S, DO   17 g at 11/15/18 0856  . sodium chloride flush (NS) 0.9 % injection 3 mL  3 mL Intravenous Q12H [name], Claire S, DO   3 mL at 11/23/18 1004  . sodium chloride flush (NS) 0.9 % injection 3 mL  3 mL Intravenous PRN [name], Claire S, DO   3 mL at 11/13/18 1049  . torsemide (DEMADEX) tablet 40 mg  40 mg Oral Daily [name], Claire S, DO   40 mg at 11/23/18 1004  . traMADol (ULTRAM) tablet 50  mg  50 mg Oral Q4H PRN [name], Loel Lofty, DO      . traZODone (DESYREL) tablet 25 mg  25 mg Oral QHS PRN [name], Loel Lofty, DO      . vitamin C (ASCORBIC ACID) tablet 250 mg  250 mg Oral BID [name], Claire S, DO   250 mg at 11/23/18 1004  . warfarin (COUMADIN) tablet 6 mg  6 mg Oral ONCE-1800 [name], Loel Lofty, DO      . Warfarin - Pharmacist Dosing Inpatient   Does not apply q1800 [name], Loel Lofty, DO      . zinc sulfate capsule 220 mg  220 mg Oral Daily Robert Bellow, MD   220 mg at 11/23/18 1004     Discharge Medications:  Please see discharge summary for a list of discharge medications.  Relevant Imaging Results:  Relevant Lab Results:   Additional Information SS#: [id number]  Ross Ludwig, LCSW

## 2018-11-23 NOTE — Care Management Important Message (Signed)
Important Message  Patient Details  Name: [name] MRN: [id number] Date of Birth: [birth year]   Medicare Important Message Given:  Yes    [name] 11/23/2018, 11:46 AM

## 2018-11-24 ENCOUNTER — Inpatient Hospital Stay: Payer: Medicare Other

## 2018-11-24 ENCOUNTER — Telehealth: Payer: Medicare Other | Admitting: Family

## 2018-11-24 LAB — PROTIME-INR
INR: 2.6 — ABNORMAL HIGH (ref 0.8–1.2)
Prothrombin Time: 27.4 seconds — ABNORMAL HIGH (ref 11.4–15.2)

## 2018-11-24 MED ORDER — WARFARIN SODIUM 5 MG PO TABS
5.0000 mg | ORAL_TABLET | Freq: Once | ORAL | Status: AC
  Administered 2018-11-24: 5 mg via ORAL
  Filled 2018-11-24: qty 1

## 2018-11-24 NOTE — Plan of Care (Signed)

## 2018-11-24 NOTE — Progress Notes (Signed)
Nutrition Follow Up Note   DOCUMENTATION CODES:   Not applicable  INTERVENTION:   [name] daughter is providing Boost from home   Magic cup TID with meals, each supplement provides 290 kcal and 9 grams of protein  MVI daily   Vitamin C 250mg  po BID  Bowel regimen per MD   NUTRITION DIAGNOSIS:   Increased nutrient needs related to wound healing as evidenced by increased estimated needs.  GOAL:   Patient will meet greater than or equal to 90% of their needs  -progressing   MONITOR:   PO intake, Supplement acceptance, Labs, Weight trends, Skin, I & O's  ASSESSMENT:   84 year old female with past medical history significant for mitral valve replacement with a mechanical valve on chronic Coumadin, atrial fibrillation/flutter status post pacemaker, CKD IV, hypertension, chronic systolic heart failure, lymphedema, GERD and hypertension, pulmonary hypertension on chronic 2 L home oxygen brought from home secondary to left lower extremity hematoma.  RD working remotely.  Pt with improving appetite and oral intake. Pt is drinking Boost brought from home by her daughter. Per chart, pt is weight stable. Pt noted to have type 1 BM today; recommend bowel regimen per MD. Pt is working with PT. RD will discontinue Ensure as pt is refusing this. Recommend continue regular diet. Will add Magic Cups to meal trays.   Medications reviewed and include: acidophilus, vitamin D, MVI, Miralax, torsemide, vitamin C, warfarin, zinc, ciprofloxacin  Labs reviewed: K 4.1 wnl, BUN 36(H), creat 1.31(H) Hgb 8.6(L), Hct 28.8(L)  Diet Order:   Diet Order            Diet regular Room service appropriate? Yes; Fluid consistency: Thin  Diet effective now             EDUCATION NEEDS:   Not appropriate for education at this time  Skin:  Skin Assessment: Reviewed RN Assessment(Incision L leg )  Last BM:  5/26- type 1  Height:   Ht Readings from Last 1 Encounters:  11/19/18 4\' 11"  (1.499 m)     Weight:   Wt Readings from Last 1 Encounters:  11/24/18 67.8 kg    Ideal Body Weight:  44.5 kg  BMI:  Body mass index is 30.2 kg/m.  Estimated Nutritional Needs:   Kcal:  1500-1700kcal/day  Protein:  75-85g/day   Fluid:  >1.2L/day   Koleen Distance MS, RD, LDN Pager #- [phone] Office#- [phone] After Hours Pager: [phone]

## 2018-11-24 NOTE — Progress Notes (Signed)
RN / MD/ and unit Director at pts bedside to discuss plan of care and pts goals for the day/ pts daughter at bedside/ pt currently refusing compression wrap on Right leg due to tenderness and risk of tearing skin/ team encouraging pt to began to get out of bed to use [facility] / pt very hesitant to have external catheter remove/ pt worked with PT and sat in chair without external catheter- but requested to have it replaced once she returned to bed/ staff agreed to place for the rest of today and tonight - with understanding that pt will be weaned off use tomorrow ( 5/27) - offered to pt that if she wanted to get out of bed again that we would help her- stated "she will let me know"/ will monitor

## 2018-11-24 NOTE — Progress Notes (Signed)
[place] for  Warfarin  Indication: atrial fibrillation, Mechanical Mitral Valve  Allergies  Allergen Reactions  . Ace Inhibitors     Cough  . Doxycycline Nausea Only  . Hydrocodone Itching and Nausea Only  . Mercury     Other reaction(s): Unknown  . Silver Dermatitis    Severe itching  . Cephalexin Rash  . Clindamycin/Lincomycin Rash  . Nickel Rash  . Penicillins Rash    Has patient had a PCN reaction causing immediate rash, facial/tongue/throat swelling, SOB or lightheadedness with hypotension: YES Has patient had a PCN reaction causing severe rash involving mucus membranes or skin necrosis: NO Has patient had a PCN reaction that required hospitalization NO Has patient had a PCN reaction occurring within the last 10 years: NO If all of the above answers are "NO", then may proceed with Cephalosporin use.    Patient Measurements: Height: 4\' 11"  (149.9 cm) Weight: 149 lb 8 oz (67.8 kg) IBW/kg (Calculated) : 43.2 Heparin Dosing Weight: 62.6 kg  Vital Signs: Temp: 97.6 F (36.4 C) (05/26 0740) Temp Source: Oral (05/26 0740) BP: 115/56 (05/26 0740) Pulse Rate: 60 (05/26 0740)  Labs: Recent Labs    11/22/18 0628 11/23/18 0634 11/24/18 0336  HGB 8.6*  --   --   HCT 28.8*  --   --   PLT 181  --   --   LABPROT 23.6* 25.8* 27.4*  INR 2.1* 2.4* 2.6*    Estimated Creatinine Clearance: 28.2 mL/min (A) (by C-G formula based on SCr of 1.31 mg/dL (H)).   Medical History: Past Medical History:  Diagnosis Date  . Arthritis    "back, hands, knees" (04/10/2016)  . Chronic atrial fibrillation    a. On Coumadin  . Chronic back pain   . 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 EF 25% by echo in 08/2015 b. RHC in 08/2015 showed normal filling pressures  . Chronic kidney disease   . Compression fracture    "several; all in my back" (04/10/2016)  . GERD (gastroesophageal reflux  disease)   . Hypertension   . Lymphedema   . Pacemaker 05/2017  . S/P MVR (mitral valve replacement)    a. MVR 1994 b. redo MVR in 08/2014 - on Coumadin  . Shortness of breath dyspnea      Assessment: Pharmacy was consulted to start heparin bridge with warfarin. Pt was on warfarin previously for atrial fibrillation with mechanical mitral valve. Per coumadin clinic notes, patient's INR goal is 2.5 - 3.5. Her home evening dose of warfarin is: 6 mg MWF and 3 mg TThSaSun. On admission patient's INR was 3.5 and patient had a hematoma in her leg. Pt went for I&D on 5/13. Vitamin K was given 5/12 and INR has came down to 1.2 for procedure.  Drug interactions: Amiodarone which can increase INR.    Date INR Warfarin Dose  5/11 3.2 Held  5/12 2.8 Held and gave Vitamin K IV  5/13 1.2 6 mg  5/14 1.2 6 mg  5/15 1.3 6 mg  5/16 1.6 6 mg   5/17 1.9 --  5/18 2.3 -- Not given due to pt being lethargic  5/19 2.0 6 mg   5/20 2.4 No dose ordered/charted?  5/21 2.1  5mg   5/22 2.2  4 mg  5/23 2.2 5 mg  5/24 2.1 6 mg  5/25 2.4 6 mg  5/26 2.6     Cardiology recommended  goal INR around 2.5 given her increased risk of bleeding  Goal of Therapy:  INR 2-3, given recent bleed (per cardiology). Monitor platelets by anticoagulation protocol: Yes   Plan:   INR 2.6 - Major DDI with amiodarone (home med). DDI w/Cipro- added 5/21.  To avoid INR from trending above 2.5, will order warfarin 5 mg PO x 1 again tonight and recheck INR with am labs.   Pharmacy will continue to monitor and adjust per consult.   Rowland Lathe, 11/24/2018 8:53 AM

## 2018-11-24 NOTE — Progress Notes (Addendum)
[place] [facility] [place] NAME: [name]    MR#:  [id number]  DATE OF BIRTH:  06/12/1935  SUBJECTIVE:   With patient and daughter [name] in the room with the unit director, charge nurse, patient's RN. Patient's concerns were addressed goals of care were set up. PT to work today. Patient had good breakfast.  REVIEW OF SYSTEMS:  Review of Systems  Constitutional: Negative for chills and fever.  HENT: Negative for hearing loss and tinnitus.   Eyes: Negative for blurred vision and double vision.  Respiratory: Negative for cough and shortness of breath.   Cardiovascular: Negative for chest pain and palpitations.  Gastrointestinal: Negative for abdominal pain, heartburn, nausea and vomiting.  Genitourinary: Negative for dysuria and urgency.  Musculoskeletal: Negative for back pain and myalgias.  Skin: Negative for itching and rash.  Neurological: Negative for dizziness and headaches.  Psychiatric/Behavioral: Negative for depression and hallucinations.    DRUG ALLERGIES:   Allergies  Allergen Reactions  . Ace Inhibitors     Cough  . Doxycycline Nausea Only  . Hydrocodone Itching and Nausea Only  . Mercury     Other reaction(s): Unknown  . Silver Dermatitis    Severe itching  . Cephalexin Rash  . Clindamycin/Lincomycin Rash  . Nickel Rash  . Penicillins Rash    Has patient had a PCN reaction causing immediate rash, facial/tongue/throat swelling, SOB or lightheadedness with hypotension: YES Has patient had a PCN reaction causing severe rash involving mucus membranes or skin necrosis: NO Has patient had a PCN reaction that required hospitalization NO Has patient had a PCN reaction occurring within the last 10 years: NO If all of the above answers are "NO", then may proceed with Cephalosporin use.   VITALS:  Blood pressure (!) 115/56, pulse 65, temperature 97.6 F (36.4 C), temperature source Oral, resp. rate 20, height 4\' 11"   (1.499 m), weight 67.8 kg, SpO2 (!) 80 %. PHYSICAL EXAMINATION:   GENERAL:  84 y.o.-year-old elderly patient lying in the bed with no acute distress.  EYES: Pupils equal, round, reactive to light and accommodation. No scleral icterus. Extraocular muscles intact.  HEENT: Head atraumatic, normocephalic. Oropharynx and nasopharynx clear.  NECK:  Supple, no jugular venous distention. No thyroid enlargement, no tenderness.  LUNGS: Normal breath sounds bilaterally, no wheezing, rhonchi or crepitation.  Decreased bibasilar breath sounds. no use of accessory muscles of respiration.  CARDIOVASCULAR: S1, S2 normal. No  rubs, or gallops.  ABDOMEN: Soft, nontender, nondistended. Bowel sounds present.  EXTREMITIES: No pedal edema, cyanosis, or clubbing.  Left leg in Ace wrap dressingNo active bleeding noticed bruising noticed no evidence of any bleeding clinically.  Good capillary refill on left toes.  Above on the right leg NEUROLOGIC:   Sensation intact. Gait not checked.  Global weakness noted. PSYCHIATRIC: The patient is alert and oriented x 3 SKIN: as above  LABORATORY PANEL:  Female CBC Recent Labs  Lab 11/22/18 0628  WBC 5.3  HGB 8.6*  HCT 28.8*  PLT 181   ------------------------------------------------------------------------------------------------------------------ Chemistries  Recent Labs  Lab 11/20/18 0427  NA 139  K 4.1  CL 100  CO2 31  GLUCOSE 94  BUN 36*  CREATININE 1.31*  CALCIUM 8.8*   RADIOLOGY:  No results found. ASSESSMENT AND PLAN:   84 year old female with past medical history significant for mitral valve replacement with a mechanical valve on chronic Coumadin, atrial fibrillation/flutter status post pacemaker, hypertension, chronic systolic heart failure, lymphedema, GERD and hypertension, pulmonary  hypertension on chronic 2 L home oxygen brought from home secondary to left lower extremity hematoma.  # Left lower extremity hematoma-after a trauma while on  Coumadin. -Appreciate surgical consult.  Patient had debridement of the left calf hematoma and a biological graft placed in on 11/11/2018..  Dr.Byrnett discussed with Carrie Sabal, DO from plastics.  Patient is s/p Acell placement on 11/19/2018 -RN to follow dressing change per Dr Dwyane Luo recommendation.  -Wound culture with Enterobacter, few basilllus species--changed to po cipro per sensitivity -Seen by Dr Trula Slade per dter's request -there is no further surgery scheduled per plastic surgeon Dr. Marla Roe  # Acute blood loss anemia-secondary to bleeding into her hematoma -Received 2 units packed RBC transfusion this admission.  Hemoglobin 7.1 on 11/16/2018. transfuse 1 more unit of blood  hemoglobin at 7.1 with 1 unit of blood transfusion at 8.7--9.2--9.0-8.6  # Acute on chronic systolic heart failure exacerbation- EF 20-25% - Remains on chronic 3 L oxygen. -  Patient has a pacemaker, atrial flutter/fibrillation status post DC cardioversion recently -Patient already transitioned to torsemide 40 mg p.o. daily.   -No ACE inhibitor due to underlying CKD and relative hypotension. -Last echo with EF of 25%  4.  History of mechanical mitral valve surgery-Coumadin has been restarted - Coumadin and monitor PT/INR -Cardiology recommended goal INR around 2.5 given her increased risk of bleeding  5.  CKD stage IV-Baseline creatinine seems to be around 2.3. Renal function remains fairly stable at this time with creatinine of 1.7-1.4-1.36  6.  Generalized weakness physical therapy recommending skilled nursing facility but pt and daughter [name] a prefers home health PT  Family would like to take her home with [facility] at discharge. CSW has sent out or bed offers as well if pt decides to change her mind.  Goals of care discussed at length. D/w pt and dter since no procedure scheduled we will need to work towards discharge planning soon.   CODE STATUS: full code TOTAL TIME TAKING CARE OF THIS  PATIENT: 25 minutes.   More than 50% of the time was spent in counseling/coordination of care: YES  POSSIBLE D/C IN ? DAYS, DEPENDING ON CLINICAL CONDITION.   Fritzi Mandes M.D on 11/24/2018 at 3:58 PM  Between 7am to 6pm - Pager - [phone]  After 6pm go to [email or web address] - Proofreader  [facility]  Office  [phone]  CC: Primary care physician; Rusty Aus, MD  Note: This dictation was prepared with Dragon dictation along with smaller phrase technology. Any transcriptional errors that result from this process are unintentional.

## 2018-11-24 NOTE — Progress Notes (Signed)
Patient ID: [name], female   DOB: [birth year], 84 y.o.   MRN: [id number] Pt is a full code.

## 2018-11-24 NOTE — Progress Notes (Signed)
Left leg Dressing changed as ordered/ pt daughter at bedside assisting/ pt tolerated well/ during dressing change pt c/o left pinky toe pain/ small amount of brusing noted left side of foot / MD made aware/ xray of left foot ordered / offered pain medication- pt refused at this time/will monitor.

## 2018-11-24 NOTE — Progress Notes (Signed)
Physical Therapy Treatment Patient Details Name: [name] MRN: [id number] DOB: 1934/08/04 Today's Date: 11/24/2018    History of Present Illness Pt is an 84 y.o. female with recent hospital discharge 11/08/18 (CHF exacerbation with cardioversion); returned to hospital 11/09/18 with ruptured large L LE hematoma.  Pt admitted with acute blood loss anemia from large L LE hematoma with bleeding (likely secondary anticoagulation therapy for a-fib, MVR, and pacemaker).  S/p debridement of L LE hematoma 11/11/18.  11/13/18 s/p dressing change of L LE wound with application of Neox graft, 5/19 dressing change, 5/21 dressing change and application of Acell.  PMH includes cardioversion 11/06/18, chronic a-fib, chronic back pain, systolic and diastolic CHF, CKD, compression fx back, pacemaker, s/p MVR, pulmonary htn, chronic venous insufficiency, B LE edema, and chronic 3 L O2 at home.    PT Comments    Patient up with CNA completing morning ADL routine upon arrival to room.  Alert and agreeable to participation with session.  Able to complete multiple sit/stand, basic transfers and progressive gait (25' x1, 10' x1) with RW, cga/min assist.  Tolerating increased active use and WBing to L LE this date; minimal reports of pain with mobility efforts.  Does continue to desat with minimal exertion despite O2 at 4L (80% with transfers/gait), requiring extended seated rest periods and pursed lip breathing for O2 recovery >90%. Would benefit from OT consult to address self-care/ADLs, energy conservation/activity pacing needs; consult placed today.  Anticipate evaluation next date.  Did offer family training/hands-on training to patient and daughter to ensure comfort/confidence with mobility at discharge; both declined at this time, reporting feeling comfortable with level of assist required.  Extensive discussion/encouragement related to importance of weaning from [facility] and transitioning to more frequent toilet  transfers (to assist with bladder retraining, further strength/mobility training).  Patient/daughter voiced understanding.  Patient begrudgingly in agreement to initiate 'weaning' with goal of going without Purewick while up in chair throughout lunch today; to complete toilet transfer and attempt to empty bladder prior to return to bed; to replace as needed and through the night once returned to bed for the afternoon.  For next date, encouraged/agreed upon that patient would progress to no Purewick for the daytime (once awake and up for the day), attempting to toilet on routine schedule (every 1-1.5 hours) ,and replacing only for nighttime use.  Patient, daughter, primary RN, CNA, unit AD and director informed/aware of goals for this day and next.     Follow Up Recommendations  SNF     Equipment Recommendations  Wheelchair (measurements PT);Wheelchair cushion (measurements PT);3in1 (PT);Hospital bed;Rolling walker with 5" wheels    Recommendations for Other Services OT consult(OT consult placed this date)     Precautions / Restrictions Precautions Precautions: Fall Precaution Comments: Per Dr Bary Castilla, pt has no weight bearing precautions Restrictions Weight Bearing Restrictions: No Other Position/Activity Restrictions: Per Dr. Bary Castilla, pt has no weight bearing precautions    Mobility  Bed Mobility               General bed mobility comments: seated on BSC upon arrival to room; in standard chair end of session  Transfers Overall transfer level: Needs assistance Equipment used: Rolling walker (2 wheeled) Transfers: Sit to/from Stand Sit to Stand: Min assist         General transfer comment: consistent cuing for hand placement (encouraged to push up from seating surfaces) and overall mechanics; improved active use of L LE noted thi sdate  Ambulation/Gait Ambulation/Gait assistance: Min assist  Gait Distance (Feet): (25' x1, 10' x1) Assistive device: Rolling walker (2  wheeled)       General Gait Details: progressing to partially reciprocal stepping pattern; forward trunk flexion; slow, cautious gait pattern.  More symmetrical stance time and weight shift to bilat LEs this date (with L ankle reaching neutral DF in loading).  Tolerating increased distance compared to previous sessions.   Stairs             Wheelchair Mobility    Modified Rankin (Stroke Patients Only)       Balance Overall balance assessment: Needs assistance Sitting-balance support: No upper extremity supported;Feet supported Sitting balance-Leahy Scale: Good Sitting balance - Comments: forward flexed, kyphotic posture   Standing balance support: Bilateral upper extremity supported Standing balance-Leahy Scale: Fair Standing balance comment: requires RW for UE support for external stabilization and overall energy conservation                            Cognition Arousal/Alertness: Awake/alert Behavior During Therapy: WFL for tasks assessed/performed Overall Cognitive Status: Within Functional Limits for tasks assessed                                 General Comments: Increased encouragement throughout session      Exercises Other Exercises Other Exercises: Toilet transfer, SPT to [facility] with RW, x2 during session, min assist from therapist.  Dep assist for hygiene, though patient declined to attempt independently. Other Exercises: Sit/stand from various sitting surfaces (edge of bed, BSC) with RW, min assist.  Consistent cuing/education for hand placement provided to both daughter and patient. Other Exercises: Did offer family training/hands-on training to patient and daughter to ensure comfort/confidence with mobility at discharge; both declined at this time, reporting feeling comfortable with level of assist required. Other Exercises: Extensive discussion/encouragement related to [facility] of [facility] and transitioning to more  frequent toilet transfers (to assist with bladder retraining, further strength/mobility training).  Patient/daughter voiced understanding.  Patient begrudgingly in agreement to initiate 'weaning' with goal of going without Purewick while up in chair throughout lunch today; to complete toilet transfer and attempt to empty bladder prior to return to bed; to replace as needed and through the night once returned to bed for the afternoon.  For next date, encouraged/agreed upon that patient would progress to no Purewick for the daytime (once awake and up for the day), attempting to toilet on routine schedule (every 1-1.5 hours) ,and replacing only for nighttime use.  Patient, daughter, primary RN, CNA, unit AD and director informed/aware of goals for this day and next.    General Comments        Pertinent Vitals/Pain Pain Assessment: Faces Faces Pain Scale: Hurts a little bit Pain Location: L LE with WBing Pain Descriptors / Indicators: Aching;Grimacing;Guarding Pain Intervention(s): Limited activity within patient's tolerance;Monitored during session;Repositioned(coordinated with nursing for pain meds prior to session; however, patient declined meds when offered)    Home Living                      Prior Function            PT Goals (current goals can now be found in the care plan section) Acute Rehab PT Goals Patient Stated Goal: to go home PT Goal Formulation: With patient Time For Goal Achievement: 11/28/18 Potential to Achieve Goals: Fair Progress towards  PT goals: Progressing toward goals    Frequency    Min 2X/week      PT Plan Current plan remains appropriate    Co-evaluation              AM-PAC PT "6 Clicks" Mobility   Outcome Measure  Help needed turning from your back to your side while in a flat bed without using bedrails?: A Little Help needed moving from lying on your back to sitting on the side of a flat bed without using bedrails?: A Little Help  needed moving to and from a bed to a chair (including a wheelchair)?: A Little Help needed standing up from a chair using your arms (e.g., wheelchair or bedside chair)?: A Little Help needed to walk in hospital room?: A Little Help needed climbing 3-5 steps with a railing? : A Lot 6 Click Score: 17    End of Session Equipment Utilized During Treatment: Gait belt;Oxygen Activity Tolerance: Patient tolerated treatment well Patient left: in chair;with call bell/phone within reach;with family/visitor present(patient insistent on sitting in standard chair (with LEs elevated) on back side of room (alarm pad won't reach).  Daughter in room and agrees not to leave patient, to call for assist with mobility as needed) Nurse Communication: Mobility status PT Visit Diagnosis: Other abnormalities of gait and mobility (R26.89);Muscle weakness (generalized) (M62.81);Difficulty in walking, not elsewhere classified (R26.2);Pain Pain - Right/Left: Left Pain - part of body: Knee;Leg     Time: 0131-4388 PT Time Calculation (min) (ACUTE ONLY): 45 min  Charges:  $Gait Training: 8-22 mins $Therapeutic Activity: 23-37 mins                     Kaylianna Detert H. Owens Shark, PT, DPT, NCS 11/24/18, 1:45 PM [phone]

## 2018-11-25 LAB — CBC
HCT: 29.9 % — ABNORMAL LOW (ref 36.0–46.0)
Hemoglobin: 9 g/dL — ABNORMAL LOW (ref 12.0–15.0)
MCH: 27.9 pg (ref 26.0–34.0)
MCHC: 30.1 g/dL (ref 30.0–36.0)
MCV: 92.6 fL (ref 80.0–100.0)
Platelets: 185 10*3/uL (ref 150–400)
RBC: 3.23 MIL/uL — ABNORMAL LOW (ref 3.87–5.11)
RDW: 16.4 % — ABNORMAL HIGH (ref 11.5–15.5)
WBC: 5.8 10*3/uL (ref 4.0–10.5)
nRBC: 0 % (ref 0.0–0.2)

## 2018-11-25 LAB — PROTIME-INR
INR: 3.1 — ABNORMAL HIGH (ref 0.8–1.2)
Prothrombin Time: 31.7 seconds — ABNORMAL HIGH (ref 11.4–15.2)

## 2018-11-25 LAB — SARS CORONAVIRUS 2 BY RT PCR (HOSPITAL ORDER, PERFORMED IN ~~LOC~~ HOSPITAL LAB): SARS Coronavirus 2: NEGATIVE

## 2018-11-25 NOTE — Plan of Care (Signed)
  Problem: Elimination: Goal: Will not experience complications related to urinary retention Outcome: Progressing   Problem: Pain Managment: Goal: General experience of comfort will improve Outcome: Progressing   Problem: Safety: Goal: Ability to remain free from injury will improve Outcome: Progressing   Problem: Clinical Measurements: Goal: Respiratory complications will improve Outcome: Not Progressing Note:  Patient requiring 3L of oxygen.    Problem: Activity: Goal: Risk for activity intolerance will decrease Outcome: Not Progressing Note:  Patient gets DOE.    Problem: Skin Integrity: Goal: Risk for impaired skin integrity will decrease Outcome: Not Progressing Note:  New hematoma evaluated on leg directly behind right knee. RN encouraged patient to redistribute pressure by frequent change in positions.

## 2018-11-25 NOTE — TOC Progression Note (Addendum)
Transition of Care [facility]) - Progression Note    Patient Details  Name: [name] MRN: [id number] Date of Birth: 1935/05/07  Transition of Care [facility]) CM/SW Contact  Ross Ludwig, [place] Phone Number: 11/25/2018, 3:12 PM  Clinical Narrative:  CSW attended meeting from 12pm-1pm, with patient's son Helene Kelp and daughter [name], the multidisciplinary team providing care for patient, patient's family prefer CIR verse SNF.  CSW and case manager will contact CIR to see if they can accept patient.     Expected Discharge Plan: [place] Barriers to Discharge: Continued Medical Work up  Expected Discharge Plan and Services Expected Discharge Plan: [place]   Discharge Planning Services: CM Consult Post Acute Care Choice: [place] arrangements for the past 2 months: Single Family Home                           HH Arranged: RN, PT [facility] Agency: [place] ([facility]) Date HH Agency Contacted: 11/09/18 Time [place]: 1428 Representative spoke with at [place]: [place] ([place]) Interventions    Readmission Risk Interventions Readmission Risk Prevention Plan 11/09/2018  Transportation Screening Complete  PCP or Specialist Appt within 3-5 Days Complete  HRI or [place] Complete  Social Work Consult for [place] Planning/Counseling Not Complete  SW consult not completed comments na  Palliative Care Screening Not Applicable  Medication Review [employer]) Complete  Some recent data might be hidden

## 2018-11-25 NOTE — TOC Progression Note (Signed)
Transition of Care [facility]) - Progression Note    Patient Details  Name: DANIELLE MINK MRN: [id number] Date of Birth: November 29, 1934  Transition [facility]) CM/SW Contact  Elza Rafter, RN Phone Number: 11/25/2018, 2:11 PM  Clinical Narrative:   Family meeting took place today from 12-1pm with daughter [name] and son Helene Kelp.  The patients family prefers CIR at [facility] for transition of care.  Called and spoke with Marisa Sprinkles coordinator and asked her to screen.       Expected Discharge Plan: [place] Barriers to Discharge: Continued Medical Work up  Expected Discharge Plan and Services Expected Discharge Plan: [place]   Discharge Planning Services: CM Consult Post Acute Care Choice: [place] arrangements for the past 2 months: Single Family Home                           HH Arranged: RN, PT [facility] Agency: [place] ([facility]) Date HH Agency Contacted: 11/09/18 Time [place] at [place]: 1428 Representative spoke with at [place]: [place] ([place]) Interventions    Readmission Risk Interventions Readmission Risk Prevention Plan 11/09/2018  Transportation Screening Complete  PCP or Specialist Appt within 3-5 Days Complete  HRI or [place] Complete  Social Work Consult for [place] Planning/Counseling Not Complete  SW consult not completed comments na  Palliative Care Screening Not Applicable  Medication Review Press photographer) Complete  Some recent data might be hidden

## 2018-11-25 NOTE — Progress Notes (Signed)
MD and RN at bedside to encourage pt to wear compression stocking on Right leg/ Right leg assessed - no open areas noted/ Pt refusing compression stockings/ wraps for her right leg- states " I know its my son's that's up to this-I know what's best for me and its not those's stockings- they hurt"/ will continue to monitor.

## 2018-11-25 NOTE — Evaluation (Signed)
Occupational Therapy Evaluation Patient Details Name: [name] MRN: [id number] DOB: 11-08-1934 Today's Date: 11/25/2018    History of Present Illness Pt is an 84 y.o. female with recent hospital discharge 11/08/18 (CHF exacerbation with cardioversion); returned to hospital 11/09/18 with ruptured large L LE hematoma.  Pt admitted with acute blood loss anemia from large L LE hematoma with bleeding (likely secondary anticoagulation therapy for a-fib, MVR, and pacemaker).  S/p debridement of L LE hematoma 11/11/18.  11/13/18 s/p dressing change of L LE wound with application of Neox graft, 5/19 dressing change, 5/21 dressing change and application of Acell.  PMH includes cardioversion 11/06/18, chronic a-fib, chronic back pain, systolic and diastolic CHF, CKD, compression fx back, pacemaker, s/p MVR, pulmonary htn, chronic venous insufficiency, B LE edema, and chronic 3 L O2 at home.   Clinical Impression   [name] was seen for OT evaluation this date. Prior to hospital admission, pt on 3 liters of O2 at home, required assistance for ADLs and IADL tasks and using a 4WW for mobility.  Pt lives in a multi-level home with her daughter and has a home care aid who visits 5x/week to assist with bathing, grooming, and dressing as well as household tasks including cleaning and meal-prep. Currently pt demonstrates impairments in activity tolerance, functional mobility, & functional UE use 2/2 generalized weakness and decreased SPo2 saturation with activity. Pt reports becoming easily fatigued or out of breath with minimal exertion requiring mod/max assist for basic ADLs. Pt educated in energy conservation conservation strategies including pursed lip breathing, activity pacing, home/routines modifications, work simplification, AE/DME, prioritizing of meaningful occupations, and falls prevention. Handout provided. Pt verbalized understanding but would benefit from additional skilled OT services to maximize  recall and carryover of learned techniques and facilitate implementation of learned techniques into daily routines. Upon discharge, recommend STR to promote improved safety and maximize return to PLOF.     Follow Up Recommendations  SNF    Equipment Recommendations  (TBD)    Recommendations for Other Services       Precautions / Restrictions Precautions Precautions: Fall Precaution Comments: Per Dr Bary Castilla, pt has no weight bearing precautions Restrictions Weight Bearing Restrictions: No Other Position/Activity Restrictions: Per Dr. Bary Castilla, pt has no weight bearing precautions      Mobility Bed Mobility Overal bed mobility: Needs Assistance Bed Mobility: Supine to Sit     Supine to sit: Mod assist Sit to supine: Min assist      Transfers Overall transfer level: Needs assistance Equipment used: Rolling walker (2 wheeled) Transfers: Sit to/from Stand Sit to Stand: Min assist         General transfer comment: Pt declined to participate in bed mobility/transfers on this date. Per chart review, pt req. mod assist for bed mobility and min assist for STS with consistent cueing for hand placment and mechanics as well as heavy encouragement throughout activity.     Balance Overall balance assessment: Needs assistance                                         ADL either performed or assessed with clinical judgement   ADL Overall ADL's : Needs assistance/impaired  General ADL Comments: Pt endorses she requires assistance for ADLs at baseline. Aid assists with bathing, dressing, and IADL tasks. Uses rollator for functional mobility and endorses being able to take short walks (with AD) daily. Pt currently very reluctant to engage in activity. Declined to attempt ADLs at time of eval. Suspect mod/max assist for high energy req ADL tasks due to decrased activity tolerance, generalized weakness, and decrased  SPO2 with activity. P     Vision Baseline Vision/History: Wears glasses Wears Glasses: At all times Patient Visual Report: No change from baseline       Perception     Praxis      Pertinent Vitals/Pain Faces Pain Scale: Hurts a little bit Pain Location: L LE when she moves. Pain Descriptors / Indicators: Aching;Grimacing;Guarding Pain Intervention(s): Limited activity within patient's tolerance;Monitored during session     Hand Dominance Right   Extremity/Trunk Assessment Upper Extremity Assessment Upper Extremity Assessment: Generalized weakness   Lower Extremity Assessment Lower Extremity Assessment: Generalized weakness;LLE deficits/detail LLE Deficits / Details: LLE does not currently have WB restrictions, however pt endorses pain with movement/weight bearing. Prior hematoma.        Communication Communication Communication: No difficulties   Cognition Arousal/Alertness: Awake/alert Behavior During Therapy: WFL for tasks assessed/performed Overall Cognitive Status: Within Functional Limits for tasks assessed                                 General Comments: Pt declined OOB stated she had just been to [facility] and did not want to move at time of eval. Agreeable to attempting EOB activity at next OT session.    General Comments       Exercises Other Exercises Other Exercises: Pt and caregiver (daughter) educated on ECS including PLB during activity, pacing with regular rest breaks, and AE to support ADL tasks. Handout left in room with pt/dtr.    Shoulder Instructions      Home Living Family/patient expects to be discharged to:: Private residence Living Arrangements: Children(Daughter) Available Help at Discharge: Family;Available PRN/intermittently;Personal care attendant Type of Home: House Home Access: Ramped entrance;Stairs to enter     Home Layout: Two level;1/2 bath on main level Alternate Level Stairs-Number of Steps: 13 Alternate Level  Stairs-Rails: Right Bathroom Shower/Tub: Walk-in shower(on first floor)   Bathroom Toilet: Handicapped height(Raised w/ handles on first floor)     Home Equipment: Walker - 4 wheels;Toilet riser;Grab bars - toilet;Grab bars - tub/shower;Shower seat - built in          Prior Functioning/Environment Level of Independence: Needs assistance  Gait / Transfers Assistance Needed: uses rollator at baseline for household mobility ADL's / Homemaking Assistance Needed: an aide comes 5x a week, 8-12 to assist with ADLs/IADLs   Comments: denies falls in the last 6 months        OT Problem List: Decreased strength;Impaired balance (sitting and/or standing);Pain;Decreased safety awareness;Decreased activity tolerance;Decreased coordination;Decreased knowledge of use of DME or AE      OT Treatment/Interventions: Self-care/ADL training;Therapeutic exercise;Patient/family education;Modalities;Neuromuscular education;Energy conservation;Therapeutic activities;DME and/or AE instruction    OT Goals(Current goals can be found in the care plan section) Acute Rehab OT Goals Patient Stated Goal: to go home OT Goal Formulation: With patient Time For Goal Achievement: 12/09/18 Potential to Achieve Goals: Good ADL Goals Pt Will Perform Grooming: with set-up;with min assist;sitting(With LRAD for safety and improved functional independence.) Pt Will Perform Upper Body Dressing: with set-up;sitting;with adaptive  equipment(With LRAD for safety and improved functional independence.) Pt Will Perform Lower Body Dressing: with min assist;with adaptive equipment;sit to/from stand(With LRAD for safety and improved functional independence.) Additional ADL Goal #1: Pt will indepenently verbalize a plan to implement at least 3 learned ECS into her daily routine for improved independence and participation in meaningful occupations of daily life upon hospital DC.  OT Frequency: Min 2X/week   Barriers to D/C: Inaccessible  home environment          Co-evaluation              AM-PAC OT "6 Clicks" Daily Activity     Outcome Measure Help from another person eating meals?: None Help from another person taking care of personal grooming?: A Little Help from another person toileting, which includes using toliet, bedpan, or urinal?: A Lot Help from another person bathing (including washing, rinsing, drying)?: A Lot Help from another person to put on and taking off regular upper body clothing?: A Lot Help from another person to put on and taking off regular lower body clothing?: A Lot 6 Click Score: 15   End of Session    Activity Tolerance: Patient limited by fatigue Patient left: in bed;with call bell/phone within reach;with bed alarm set;with family/visitor present  OT Visit Diagnosis: Other abnormalities of gait and mobility (R26.89);Muscle weakness (generalized) (M62.81);Pain Pain - Right/Left: Left Pain - part of body: Leg                Time: 3128-1188 OT Time Calculation (min): 29 min Charges:  OT General Charges $OT Visit: 1 Visit OT Evaluation $OT Eval Moderate Complexity: 1 Mod OT Treatments $Self Care/Home Management : 8-22 mins  Shara Blazing, M.S., OTR/L Ascom: [phone] 11/25/18, 12:07 PM

## 2018-11-25 NOTE — TOC Progression Note (Signed)
Transition of Care [facility]) - Progression Note    Patient Details  Name: [name] MRN: [id number] Date of Birth: 11-10-1934  Transition of Care [facility]) CM/SW Contact  Elza Rafter, RN Phone Number: 11/25/2018, 3:31 PM  Clinical Narrative:   Hulen Skains and spoke with Seth Bake at [facility] as this is a preferred SNF of the family.  They are accepting patient.  Notified family and Dr. Posey Pronto.  CIR at this time is unable to accept patient due to PT assessments.  Family will discuss [facility] as an option and notify CM/SW tomorrow.      Expected Discharge Plan: [place] Barriers to Discharge: Continued Medical Work up  Expected Discharge Plan and Services Expected Discharge Plan: [place]   Discharge Planning Services: CM Consult Post Acute Care Choice: [place] arrangements for the past 2 months: Single Family Home                           HH Arranged: RN, PT [facility] Agency: [place] ([facility]) Date HH Agency Contacted: 11/09/18 Time [place]: 1428 Representative spoke with at [place]: [place] ([place]) Interventions    Readmission Risk Interventions Readmission Risk Prevention Plan 11/09/2018  Transportation Screening Complete  PCP or Specialist Appt within 3-5 Days Complete  HRI or [place] Complete  Social Work Consult for [place] Planning/Counseling Not Complete  SW consult not completed comments na  Palliative Care Screening Not Applicable  Medication Review Press photographer) Complete  Some recent data might be hidden

## 2018-11-25 NOTE — Progress Notes (Signed)
Rehab Admissions Coordinator Note:  Per request from [place], this patient was screened by Jhonnie Garner for appropriateness for an Inpatient Acute Rehab Consult.   After chart review feel the patient is more appropriate for SNF placement rather than CIR due to low tolerance level and poor overall participation. Pt would benefit from a longer, slower paced rehab venue as it does not appear that she can/will tolerate 3hrs/day of therapy. AC has communicated recommendations to [facility].   Please call if questions.    Jhonnie Garner 11/25/2018, 2:46 PM  I can be reached at [phone].

## 2018-11-25 NOTE — Progress Notes (Signed)
[place] [facility] [place] NAME: [name]    MR#:  [id number]  DATE OF BIRTH:  August 10, 1934  SUBJECTIVE:   Patient resting quietly. She worked with occupational therapy earlier. She said she ate good breakfast. She will work with physical therapy today. She said she did fairly well yesterday.  REVIEW OF SYSTEMS:  Review of Systems  Constitutional: Negative for chills and fever.  HENT: Negative for hearing loss and tinnitus.   Eyes: Negative for blurred vision and double vision.  Respiratory: Positive for shortness of breath. Negative for cough.   Cardiovascular: Negative for chest pain and palpitations.  Gastrointestinal: Negative for abdominal pain, heartburn, nausea and vomiting.  Genitourinary: Negative for dysuria and urgency.  Musculoskeletal: Negative for back pain and myalgias.  Skin: Negative for itching and rash.  Neurological: Positive for weakness. Negative for dizziness and headaches.  Psychiatric/Behavioral: Negative for depression and hallucinations.    DRUG ALLERGIES:   Allergies  Allergen Reactions   Ace Inhibitors     Cough   Doxycycline Nausea Only   Hydrocodone Itching and Nausea Only   Mercury     Other reaction(s): Unknown   Silver Dermatitis    Severe itching   Cephalexin Rash   Clindamycin/Lincomycin Rash   Nickel Rash   Penicillins Rash    Has patient had a PCN reaction causing immediate rash, facial/tongue/throat swelling, SOB or lightheadedness with hypotension: YES Has patient had a PCN reaction causing severe rash involving mucus membranes or skin necrosis: NO Has patient had a PCN reaction that required hospitalization NO Has patient had a PCN reaction occurring within the last 10 years: NO If all of the above answers are "NO", then may proceed with Cephalosporin use.   VITALS:  Blood pressure (!) 104/51, pulse 61, temperature 98.5 F (36.9 C), temperature source Oral, resp. rate 20,  height 4\' 11"  (1.499 m), weight 67.6 kg, SpO2 96 %. PHYSICAL EXAMINATION:   GENERAL:  84 y.o.-year-old elderly patient lying in the bed with no acute distress.  EYES: Pupils equal, round, reactive to light and accommodation. No scleral icterus. Extraocular muscles intact.  HEENT: Head atraumatic, normocephalic. Oropharynx and nasopharynx clear.  NECK:  Supple, no jugular venous distention. No thyroid enlargement, no tenderness.  LUNGS: Normal breath sounds bilaterally, no wheezing, rhonchi or crepitation.  Decreased bibasilar breath sounds. no use of accessory muscles of respiration.  CARDIOVASCULAR: S1, S2 normal. No  rubs, or gallops.  ABDOMEN: Soft, nontender, nondistended. Bowel sounds present.  EXTREMITIES: No pedal edema, cyanosis, or clubbing.  Left leg in Ace wrap dressingNo active bleeding noticed bruising noticed no evidence of any bleeding clinically.  Good capillary refill on left toes.  Above on the right leg NEUROLOGIC:   Sensation intact. Gait not checked.  Global weakness noted. PSYCHIATRIC: The patient is alert and oriented x 3 SKIN: as above  LABORATORY PANEL:  Female CBC Recent Labs  Lab 11/25/18 0934  WBC 5.8  HGB 9.0*  HCT 29.9*  PLT 185   ------------------------------------------------------------------------------------------------------------------ Chemistries  Recent Labs  Lab 11/20/18 0427  NA 139  K 4.1  CL 100  CO2 31  GLUCOSE 94  BUN 36*  CREATININE 1.31*  CALCIUM 8.8*   RADIOLOGY:  Dg Foot Complete Left  Result Date: 11/24/2018 CLINICAL DATA:  Lateral left foot pain for 2-3 days with a wound and bruising. EXAM: LEFT FOOT - COMPLETE 3+ VIEW COMPARISON:  None. FINDINGS: The bones are diffusely osteopenic. There is minimal soft  tissue prominence at the base of the fifth metatarsal. No acute fracture, dislocation, osseous erosion, soft tissue emphysema, radiopaque foreign body is identified. Extensive atherosclerotic vascular calcifications are  noted. IMPRESSION: No acute osseous abnormality identified. Electronically Signed   By: Logan Bores M.D.   On: 11/24/2018 17:25   ASSESSMENT AND PLAN:   84 year old female with past medical history significant for mitral valve replacement with a mechanical valve on chronic Coumadin, atrial fibrillation/flutter status post pacemaker, hypertension, chronic systolic heart failure, lymphedema, GERD and hypertension, pulmonary hypertension on chronic 2 L home oxygen brought from home secondary to left lower extremity hematoma.  # Left lower extremity hematoma-after a trauma while on Coumadin. -Appreciate surgical consult.  Patient had debridement of the left calf hematoma and a biological graft placed in on 11/11/2018..  Dr.Byrnett discussed with [name], DO from plastics.  Patient is s/p Acell placement on 11/19/2018 -RN to follow dressing change per Dr Dwyane Luo recommendation.  -Wound culture with Enterobacter, few basilllus species--changed to po cipro per sensitivity -Seen by Dr Trula Slade per dter's request -there is no further surgery scheduled per plastic surgeon Dr. [name]/dr byrnett  # Acute blood loss anemia-secondary to bleeding into her hematoma -Received 2 units packed RBC transfusion this admission.  Hemoglobin 7.1 on 11/16/2018. transfuse 1 more unit of blood  hemoglobin at 7.1 with 1 unit of blood transfusion at 8.7--9.2--9.0-8.6--9.0  # Acute on chronic systolic heart failure exacerbation- EF 20-25% - Remains on chronic 3 L oxygen. -  Patient has a pacemaker, atrial flutter/fibrillation status post DC cardioversion recently - cont torsemide 40 mg p.o. daily.   -No ACE inhibitor due to underlying CKD and relative hypotension. -Last echo with EF of 25%  4.  History of mechanical mitral valve surgery-Coumadin has been restarted - Coumadin and monitor PT/INR -Cardiology recommended goal INR around 2.5 given her increased risk of bleeding  5.  CKD stage IV-Baseline  creatinine seems to be around 2.3. Renal function remains fairly stable at this time with creatinine of 1.7-1.4-1.36  6.  Generalized weakness physical therapy recommending skilled nursing facility  7. Bilateral lower extremity chronic lymphedema -family mentioned patient has Ted hose which they have brought in from home and would like patient to use it. I specifically asked patient if we can use the Ted hose on the right lower extremity to prevent edema and hematomas and she declined immediately that she is not going to allow anybody to put it on.  Family would like to take her home with [facility] at discharge. CSW has sent out or bed offers as well if pt decides to change her mind.  Goals of care discussed at length. D/w pt and dter since no procedure scheduled we will need to work towards discharge planning soon.  We had family meeting held in the classroom on the second floor with nursing administration, Therapist, sports, myself, social worker, Transport planner, Probation officer and physical therapy along with patient's son Helene Kelp and daughter [name].   CODE STATUS: full code TOTAL TIME TAKING CARE OF THIS PATIENT: 25 minutes.   More than 50% of the time was spent in counseling/coordination of care: YES  POSSIBLE D/C IN ? DAYS, DEPENDING ON CLINICAL CONDITION.   Fritzi Mandes M.D on 11/25/2018 at 1:25 PM  Between 7am to 6pm - Pager - [phone]  After 6pm go to [email or web address] - Proofreader  [facility]  Office  [phone]  CC: Primary care physician; Rusty Aus, MD  Note:  This dictation was prepared with Dragon dictation along with smaller phrase technology. Any transcriptional errors that result from this process are unintentional.

## 2018-11-25 NOTE — Progress Notes (Signed)
[place] for  Warfarin  Indication: atrial fibrillation, Mechanical Mitral Valve  Allergies  Allergen Reactions  . Ace Inhibitors     Cough  . Doxycycline Nausea Only  . Hydrocodone Itching and Nausea Only  . Mercury     Other reaction(s): Unknown  . Silver Dermatitis    Severe itching  . Cephalexin Rash  . Clindamycin/Lincomycin Rash  . Nickel Rash  . Penicillins Rash    Has patient had a PCN reaction causing immediate rash, facial/tongue/throat swelling, SOB or lightheadedness with hypotension: YES Has patient had a PCN reaction causing severe rash involving mucus membranes or skin necrosis: NO Has patient had a PCN reaction that required hospitalization NO Has patient had a PCN reaction occurring within the last 10 years: NO If all of the above answers are "NO", then may proceed with Cephalosporin use.    Patient Measurements: Height: 4\' 11"  (149.9 cm) Weight: 149 lb (67.6 kg) IBW/kg (Calculated) : 43.2 Heparin Dosing Weight: 62.6 kg  Vital Signs: Temp: 98.5 F (36.9 C) (05/27 0723) Temp Source: Oral (05/27 0723) BP: 104/51 (05/27 0723) Pulse Rate: 61 (05/27 0723)  Labs: Recent Labs    11/23/18 0634 11/24/18 0336 11/25/18 0336  LABPROT 25.8* 27.4* 31.7*  INR 2.4* 2.6* 3.1*    Estimated Creatinine Clearance: 28.2 mL/min (A) (by C-G formula based on SCr of 1.31 mg/dL (H)).   Medical History: Past Medical History:  Diagnosis Date  . Arthritis    "back, hands, knees" (04/10/2016)  . Chronic atrial fibrillation    a. On Coumadin  . Chronic back pain   . 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 EF 25% by echo in 08/2015 b. RHC in 08/2015 showed normal filling pressures  . Chronic kidney disease   . Compression fracture    "several; all in my back" (04/10/2016)  . GERD (gastroesophageal reflux disease)   . Hypertension   . Lymphedema   . Pacemaker 05/2017  . S/P  MVR (mitral valve replacement)    a. MVR 1994 b. redo MVR in 08/2014 - on Coumadin  . Shortness of breath dyspnea      Assessment: Pharmacy was consulted to start heparin bridge with warfarin. Pt was on warfarin previously for atrial fibrillation with mechanical mitral valve. Per coumadin clinic notes, patient's INR goal is 2.5 - 3.5. Her home evening dose of warfarin is: 6 mg MWF and 3 mg TThSaSun. On admission patient's INR was 3.5 and patient had a hematoma in her leg. Pt went for I&D on 5/13. Vitamin K was given 5/12 and INR has came down to 1.2 for procedure.  Drug interactions: Amiodarone which can increase INR.   Date INR Warfarin Dose  5/11 3.2 Held  5/12 2.8 Held and gave Vitamin K IV  5/13 1.2 6 mg  5/14 1.2 6 mg  5/15 1.3 6 mg  5/16 1.6 6 mg   5/17 1.9 --  5/18 2.3 -- Not given due to pt being lethargic  5/19 2.0 6 mg   5/20 2.4 No dose ordered/charted?  5/21 2.1  5mg   5/22 2.2  4 mg  5/23 2.2  5 mg  5/24 2.1 6 mg  5/25 2.4 6mg   5/26 2.6 5mg   5/27 3.1 Hold dose   Cardiology recommended goal INR around 2.5 given her increased risk of bleeding  Goal of Therapy:  INR 2-3, given recent bleed (per cardiology, even with valve). Monitor  platelets by anticoagulation protocol: Yes   Plan:   Home regimen is as stated above with a total weekly dose of 30mg .  INR 3.1 - Major DDI with amiodarone (home med) AND DDI w/Cipro (added 5/21)  Will hold warfarin tonight and recheck INR with am labs - consider restarting tomorrow with simplified regimen of 4mg  daily (this would be 2mg  less weekly than current home regimen and patient arrived with INR 3.2 from home regimen).   Pharmacy will continue to monitor and adjust per consult.   Lu Duffel, PharmD, BCPS Clinical Pharmacist 11/25/2018 9:03 AM

## 2018-11-25 NOTE — Progress Notes (Signed)
OT Cancellation Note  Patient Details Name: [name] MRN: [id number] DOB: 1934/11/01   Cancelled Treatment:    Reason Eval/Treat Not Completed: Patient declined, no reason specified. Thank you for the OT consult. Order received and chart reviewed. Upon arrival to pt room, pt supine in bed with RN in room to give am meds. OT described role of service to this pt and asked if pt was willing to work with OT on this date. Pt responded, "not right now, I need my breakfast and to get situated." OT will re-attempt at a later time/date as available and pt medically appropriate for OT eval.   Shara Blazing, M.S., OTR/L Ascom: [phone] 11/25/18, 9:16 AM

## 2018-11-26 LAB — PROTIME-INR
INR: 3.4 — ABNORMAL HIGH (ref 0.8–1.2)
Prothrombin Time: 33.6 seconds — ABNORMAL HIGH (ref 11.4–15.2)

## 2018-11-26 MED ORDER — WARFARIN SODIUM 3 MG PO TABS: 3.0000 mg | ORAL_TABLET | Freq: Once | ORAL | Status: DC

## 2018-11-26 NOTE — Progress Notes (Signed)
Brief Nutrition Note  RD tried to return call to daughter [name]'s phone; mailbox was full so unable to leave message. RD working remotely, will not be back onsite until 6/1. Will try to contact daughter again at a later date.   Koleen Distance MS, RD, LDN Pager #- [phone] Office#- [phone] After Hours Pager: [phone]

## 2018-11-26 NOTE — TOC Progression Note (Signed)
Transition of Care [facility]) - Progression Note    Patient Details  Name: [name] MRN: [id number] Date of Birth: May 24, 1935  Transition of Care [facility]) CM/SW Contact  Elza Rafter, RN Phone Number: 11/26/2018, 11:40 AM  Clinical Narrative:   [name] with [name] this morning at 11:00 to see if they made a decision on transitioning to [facility].  She has several questions for the facility.  Gave [name]'s number.      Expected Discharge Plan: [place] Barriers to Discharge: Continued Medical Work up  Expected Discharge Plan and Services Expected Discharge Plan: [place]   Discharge Planning Services: CM Consult Post Acute Care Choice: [place] arrangements for the past 2 months: Single Family Home                           HH Arranged: RN, PT [facility] Agency: [place] [facility]) Date HH Agency Contacted: 11/09/18 Time [place]: 1428 Representative spoke with at [place]: [place] ([place]) Interventions    Readmission Risk Interventions Readmission Risk Prevention Plan 11/09/2018  Transportation Screening Complete  PCP or Specialist Appt within 3-5 Days Complete  HRI or [place] Complete  Social Work Consult for [place] Planning/Counseling Not Complete  SW consult not completed comments na  Palliative Care Screening Not Applicable  Medication Review [employer]) Complete  Some recent data might be hidden

## 2018-11-26 NOTE — Progress Notes (Addendum)
[place] at [place] NAME: [name]    MR#:  [id number]  DATE OF BIRTH:  July 19, 1934  SUBJECTIVE:   Patient resting quietly. dter at the bedside Appears at baseline. She gets SOB with PT but recovers with rest in couple minutes  REVIEW OF SYSTEMS:  Review of Systems  Constitutional: Negative for chills and fever.  HENT: Negative for hearing loss and tinnitus.   Eyes: Negative for blurred vision and double vision.  Respiratory: Positive for shortness of breath. Negative for cough.   Cardiovascular: Negative for chest pain and palpitations.  Gastrointestinal: Negative for abdominal pain, heartburn, nausea and vomiting.  Genitourinary: Negative for dysuria and urgency.  Musculoskeletal: Negative for back pain and myalgias.  Skin: Negative for itching and rash.  Neurological: Positive for weakness. Negative for dizziness and headaches.  Psychiatric/Behavioral: Negative for depression and hallucinations.    DRUG ALLERGIES:   Allergies  Allergen Reactions  . Ace Inhibitors     Cough  . Doxycycline Nausea Only  . Hydrocodone Itching and Nausea Only  . Mercury     Other reaction(s): Unknown  . Silver Dermatitis    Severe itching  . Cephalexin Rash  . Clindamycin/Lincomycin Rash  . Nickel Rash  . Penicillins Rash    Has patient had a PCN reaction causing immediate rash, facial/tongue/throat swelling, SOB or lightheadedness with hypotension: YES Has patient had a PCN reaction causing severe rash involving mucus membranes or skin necrosis: NO Has patient had a PCN reaction that required hospitalization NO Has patient had a PCN reaction occurring within the last 10 years: NO If all of the above answers are "NO", then may proceed with Cephalosporin use.   VITALS:  Blood pressure (!) 108/48, pulse (!) 59, temperature 98.2 F (36.8 C), resp. rate 19, height 4\' 11"  (1.499 m), weight 66.2 kg, SpO2 96 %. PHYSICAL EXAMINATION:    GENERAL:  84 y.o.-year-old elderly patient lying in the bed with no acute distress.  EYES: Pupils equal, round, reactive to light and accommodation. No scleral icterus. Extraocular muscles intact.  HEENT: Head atraumatic, normocephalic. Oropharynx and nasopharynx clear.  NECK:  Supple, no jugular venous distention. No thyroid enlargement, no tenderness.  LUNGS: Normal breath sounds bilaterally, no wheezing, rhonchi or crepitation.  Decreased bibasilar breath sounds. no use of accessory muscles of respiration.  CARDIOVASCULAR: S1, S2 normal. No  rubs, or gallops.  ABDOMEN: Soft, nontender, nondistended. Bowel sounds present.  EXTREMITIES: + pedal edema,no cyanosis, or clubbing.  Left leg in Ace wrap dressingNo active bleeding noticed bruising noticed no evidence of any bleeding clinically. toes.  Above on the right leg NEUROLOGIC:   Sensation intact. Gait not checked.  Global weakness  PSYCHIATRIC: The patient is alert and oriented x 3 SKIN: as above  LABORATORY PANEL:  Female CBC Recent Labs  Lab 11/25/18 0934  WBC 5.8  HGB 9.0*  HCT 29.9*  PLT 185   ------------------------------------------------------------------------------------------------------------------ Chemistries  Recent Labs  Lab 11/20/18 0427  NA 139  K 4.1  CL 100  CO2 31  GLUCOSE 94  BUN 36*  CREATININE 1.31*  CALCIUM 8.8*   RADIOLOGY:  No results found. ASSESSMENT AND PLAN:   84 year old female with past medical history significant for mitral valve replacement with a mechanical valve on chronic Coumadin, atrial fibrillation/flutter status post pacemaker, hypertension, chronic systolic heart failure, lymphedema, GERD and hypertension, pulmonary hypertension on chronic 2 L home oxygen brought from home secondary to left lower extremity hematoma.  #  Left lower extremity hematoma-after a trauma while on Coumadin. -Appreciate surgical consult.  Patient had debridement of the left calf hematoma and a  biological graft placed in on 11/11/2018..  Dr.Byrnett discussed with [name], DO from plastics.  Patient is s/p Acell placement on 11/19/2018 -RN to follow dressing change per Dr Elenor Quinones recommendation.  -Wound culture with Enterobacter, few basilllus species--changed to po cipro per sensitivity -Seen by Dr Trula Slade per dter's request -there is no further surgery scheduled per plastic surgeon Dr. [name]/dr byrnett  # Acute blood loss anemia-secondary to bleeding into her hematoma -Received 2 units packed RBC transfusion this admission.  Hemoglobin 7.1 on 11/16/2018. transfuse 1 more unit of blood  hemoglobin at 7.1 with 1 unit of blood transfusion at 8.7--9.2--9.0-8.6--9.0  # Acute on chronic systolic heart failure exacerbation- EF 20-25% - Remains on chronic 3 L oxygen. -  Patient has a pacemaker, atrial flutter/fibrillation status post DC cardioversion recently - cont torsemide 40 mg p.o. daily.   -No ACE inhibitor due to underlying CKD and relative hypotension per Cardiology. [name] aware -Last echo with EF of 25% -Pt does desat into the 80's with exertion and recovers after few mins.during PT  Oxygen requirements go up temporarily but she settles down.  4.  History of mechanical mitral valve surgery-Coumadin has been restarted - Coumadin and monitor PT/INR -Cardiology recommended goal INR around 2.5 given her increased risk of bleeding  5. CKD stage IV-Baseline creatinine seems to be around 2.3. Renal function remains fairly stable at this time with creatinine of 1.7-1.4-1.36--1.  6.  Generalized weakness physical therapy recommending skilled nursing facility  7. Bilateral lower extremity chronic lymphedema -family mentioned patient has Ted hose which they have brought in from home and would like patient to use it. I specifically asked patient if we can use the Ted hose on the right lower extremity to prevent edema and hematomas and she declined immediately that she  is not going to allow anybody to put it on.  Goals of care discussed at length. D/w pt and dter since no procedure scheduled we will need to work towards discharge planning soon. Pt has a bed at [facility]. [name]  wants to mentally be prepared for pt (mom ) to go to [place] and wants to wait till Monday. Pt is at baseline and overall ok for discharge.   Evette RN was present during the conversation.  [name] purwick will be removed during the daytime. She can have it at nighttime only. [name] agrees with it  CODE STATUS: full code TOTAL TIME TAKING CARE OF THIS PATIENT: 45 minutes.   More than 50% of the time was spent in counseling/coordination of care: YES  POSSIBLE D/C IN ? DAYS, DEPENDING ON CLINICAL CONDITION.   Fritzi Mandes M.D on 11/26/2018 at 1:38 PM  Between 7am to 6pm - Pager - [phone]  After 6pm go to [email or web address] - Proofreader  [employer] [place] [facility]  Office  [phone]  CC: Primary care physician; Rusty Aus, MD  Note: This dictation was prepared with Dragon dictation along with smaller phrase technology. Any transcriptional errors that result from this process are unintentional.

## 2018-11-26 NOTE — Progress Notes (Addendum)
Physical Therapy Treatment Patient Details Name: [name] MRN: [id number] DOB: 06-17-1935 Today's Date: 11/26/2018    History of Present Illness Pt is an 84 y.o. female with recent hospital discharge 11/08/18 (CHF exacerbation with cardioversion); returned to hospital 11/09/18 with ruptured large L LE hematoma.  Pt admitted with acute blood loss anemia from large L LE hematoma with bleeding (likely secondary anticoagulation therapy for a-fib, MVR, and pacemaker).  S/p debridement of L LE hematoma 11/11/18.  11/13/18 s/p dressing change of L LE wound with application of Neox graft, 5/19 dressing change, 5/21 dressing change and application of Acell.  PMH includes cardioversion 11/06/18, chronic a-fib, chronic back pain, systolic and diastolic CHF, CKD, compression fx back, pacemaker, s/p MVR, pulmonary htn, chronic venous insufficiency, B LE edema, and chronic 3 L O2 at home.    PT Comments    Patient up in standard chair upon arrival to session; purewick removed.  Gradual increase in gait distance and overall activity tolerance this date.  Patient noted to complete gait (25' x2) with RW, cga/min assist and additional seated therex (issued as HEP); all progressed from previous session.  Continues to desat to low-80s on 6L (increased with activity), recovering to >90% within 2 min seated rest and pursed lip breathing.  MD, RNCM, unit director informed/aware of progress, O2 needs and continued goals for patient. Patient agreeable to remain in chair until OT session; encouraged/instructed for OOB to chair for dinner with completion of HEP x1 before bed this evening while OOB in chair.  Patient voiced understanding of recommendations.   Follow Up Recommendations  SNF     Equipment Recommendations  Wheelchair (measurements PT);Wheelchair cushion (measurements PT);3in1 (PT);Hospital bed;Rolling walker with 5" wheels    Recommendations for Other Services       Precautions / Restrictions  Precautions Precautions: Fall Precaution Comments: Per Dr Bary Castilla, pt has no weight bearing precautions    Mobility  Bed Mobility               General bed mobility comments: seated in standard chair upon arrival, at end of session  Transfers Overall transfer level: Needs assistance Equipment used: Rolling walker (2 wheeled) Transfers: Sit to/from Stand Sit to Stand: Min guard;Min assist         General transfer comment: improving carry-over of hand placement with movement transition  Ambulation/Gait Ambulation/Gait assistance: Min guard;Min assist Gait Distance (Feet): 30 Feet(x2) Assistive device: Rolling walker (2 wheeled)       General Gait Details: partially reciprocal stepping pattern; forward trunk flexion despite cuing for postural extension; slow and steady, broad turning radius.  Good tolerance for WBing L LE   Stairs             Wheelchair Mobility    Modified Rankin (Stroke Patients Only)       Balance Overall balance assessment: Needs assistance Sitting-balance support: No upper extremity supported;Feet supported Sitting balance-Leahy Scale: Good     Standing balance support: Bilateral upper extremity supported Standing balance-Leahy Scale: Fair                              Cognition Arousal/Alertness: Awake/alert Behavior During Therapy: WFL for tasks assessed/performed Overall Cognitive Status: Within Functional Limits for tasks assessed  Exercises Other Exercises Other Exercises: Seated LE therex, 1x10, AROM for muscular strength/endurance: ankle pumps, LAQs, marching, iso hip adduct, alt UE/LE flex/ext (to shoulder height only), chair pushups.  Fair isolated strength; min cuing for technique.  Issued handout with written/pictorial descriptions of seated therex for use as HEP; encouraged performance x1 additional time this evening before bed.  Patient/daughter voiced  understanding and awareness. Other Exercises: Continued to reinforce importance of weaning from [place] and increasing OOB activities; with encouragement, patient voices understanding and agreement with plan (goal of weaning to no purewick for daytime hours)    General Comments        Pertinent Vitals/Pain Pain Assessment: Faces Faces Pain Scale: Hurts a little bit Pain Location: L LE when she moves. Pain Descriptors / Indicators: Aching;Grimacing;Guarding Pain Intervention(s): Limited activity within patient's tolerance;Monitored during session;Repositioned    Home Living                      Prior Function            PT Goals (current goals can now be found in the care plan section) Acute Rehab PT Goals Patient Stated Goal: to go home PT Goal Formulation: With patient Time For Goal Achievement: 11/28/18 Potential to Achieve Goals: Fair Progress towards PT goals: Progressing toward goals    Frequency    Min 2X/week      PT Plan Current plan remains appropriate    Co-evaluation              AM-PAC PT "6 Clicks" Mobility   Outcome Measure  Help needed turning from your back to your side while in a flat bed without using bedrails?: A Little Help needed moving from lying on your back to sitting on the side of a flat bed without using bedrails?: A Little Help needed moving to and from a bed to a chair (including a wheelchair)?: A Little Help needed standing up from a chair using your arms (e.g., wheelchair or bedside chair)?: A Little Help needed to walk in hospital room?: A Little Help needed climbing 3-5 steps with a railing? : A Lot 6 Click Score: 17    End of Session Equipment Utilized During Treatment: Gait belt;Oxygen Activity Tolerance: Patient tolerated treatment well Patient left: in chair;with call bell/phone within reach;with chair alarm set;with family/visitor present Nurse Communication: Mobility status PT Visit Diagnosis: Other  abnormalities of gait and mobility (R26.89);Muscle weakness (generalized) (M62.81);Difficulty in walking, not elsewhere classified (R26.2);Pain Pain - Right/Left: Left Pain - part of body: Leg     Time: 3810-1751 PT Time Calculation (min) (ACUTE ONLY): 34 min  Charges:  $Gait Training: 8-22 mins $Therapeutic Exercise: 8-22 mins                     Edwards Mckelvie H. Owens Shark, PT, DPT, NCS 11/26/18, 3:22 PM [phone]

## 2018-11-26 NOTE — Progress Notes (Addendum)
[place] for  Warfarin  Indication: atrial fibrillation, Mechanical Mitral Valve  Allergies  Allergen Reactions  . Ace Inhibitors     Cough  . Doxycycline Nausea Only  . Hydrocodone Itching and Nausea Only  . Mercury     Other reaction(s): Unknown  . Silver Dermatitis    Severe itching  . Cephalexin Rash  . Clindamycin/Lincomycin Rash  . Nickel Rash  . Penicillins Rash    Has patient had a PCN reaction causing immediate rash, facial/tongue/throat swelling, SOB or lightheadedness with hypotension: YES Has patient had a PCN reaction causing severe rash involving mucus membranes or skin necrosis: NO Has patient had a PCN reaction that required hospitalization NO Has patient had a PCN reaction occurring within the last 10 years: NO If all of the above answers are "NO", then may proceed with Cephalosporin use.    Patient Measurements: Height: 4\' 11"  (149.9 cm) Weight: 145 lb 14.4 oz (66.2 kg) IBW/kg (Calculated) : 43.2 Heparin Dosing Weight: 62.6 kg  Vital Signs: Temp: 98.3 F (36.8 C) (05/28 0353) Temp Source: Oral (05/28 0353) BP: 113/49 (05/28 0353) Pulse Rate: 62 (05/28 0353)  Labs: Recent Labs    11/24/18 0336 11/25/18 0336 11/25/18 0934 11/26/18 0349  HGB  --   --  9.0*  --   HCT  --   --  29.9*  --   PLT  --   --  185  --   LABPROT 27.4* 31.7*  --  33.6*  INR 2.6* 3.1*  --  3.4*    Estimated Creatinine Clearance: 27.9 mL/min (A) (by C-G formula based on SCr of 1.31 mg/dL (H)).   Medical History: Past Medical History:  Diagnosis Date  . Arthritis    "back, hands, knees" (04/10/2016)  . Chronic atrial fibrillation    a. On Coumadin  . Chronic back pain   . 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 EF 25% by echo in 08/2015 b. RHC in 08/2015 showed normal filling pressures  . Chronic kidney disease   . Compression fracture    "several; all in my back"  (04/10/2016)  . GERD (gastroesophageal reflux disease)   . Hypertension   . Lymphedema   . Pacemaker 05/2017  . S/P MVR (mitral valve replacement)    a. MVR 1994 b. redo MVR in 08/2014 - on Coumadin  . Shortness of breath dyspnea      Assessment: Pharmacy was consulted to start heparin bridge with warfarin. Pt was on warfarin previously for atrial fibrillation with mechanical mitral valve. Per coumadin clinic notes, patient's INR goal is 2.5 - 3.5. Her home evening dose of warfarin is: 6 mg MWF and 3 mg TThSaSun. On admission patient's INR was 3.5 and patient had a hematoma in her leg. Pt went for I&D on 5/13. Vitamin K was given 5/12 and INR has came down to 1.2 for procedure.  Drug interactions: Amiodarone which can increase INR.   Date INR Warfarin Dose  5/11 3.2 Held  5/12 2.8 Held and gave Vitamin K IV  5/13 1.2 6 mg  5/14 1.2 6 mg  5/15 1.3 6 mg  5/16 1.6 6 mg   5/17 1.9 --  5/18 2.3 -- Not given due to pt being lethargic  5/19 2.0 6 mg   5/20 2.4 No dose ordered/charted?  5/21 2.1  5mg   5/22 2.2  4 mg  5/23 2.2  5 mg  5/24  2.1 6 mg  5/25 2.4 6mg   5/26 2.6 5mg   5/27 3.1 Hold dose  5/28 3.4 Hold dose   Cardiology recommended goal INR around 2.5 given her increased risk of bleeding  Goal of Therapy:  INR 2-3, given recent bleed (per cardiology, even with valve). Monitor platelets by anticoagulation protocol: Yes   Plan:   Home regimen is as stated above with a total weekly dose of 30mg .  INR 3.4 - Major DDI with amiodarone (home med) AND DDI w/Cipro (added 5/21 - now complete)  Will hold warfarin tonight and recheck INR with am labs - consider resuming tomorrow with simplified regimen of 3mg  daily (this would be 9mg  less weekly than current home regimen and patient arrived with INR 3.2 from home regimen).   Pharmacy will continue to monitor and adjust per consult.   Lu Duffel, PharmD, BCPS Clinical Pharmacist 11/26/2018 7:37 AM

## 2018-11-26 NOTE — Care Management Important Message (Signed)
Important Message  Patient Details  Name: [name] MRN: [id number] Date of Birth: February 11, 1935   Medicare Important Message Given:  Yes    Elza Rafter, RN 11/26/2018, 12:13 PM

## 2018-11-26 NOTE — Progress Notes (Signed)
RN offered to get pt out of bed and into chair/ also trying to encourage pt to let staff removed external cath and pt using BSC/ educated pt on the importance of becoming more active- pt refusing at this time to get in chair or to have external cath removed- will reattempts later

## 2018-11-26 NOTE — Progress Notes (Signed)
Occupational Therapy Treatment Patient Details Name: [name] MRN: [id number] DOB: 1934-12-19 Today's Date: 11/26/2018    History of present illness Pt is an 84 y.o. female with recent hospital discharge 11/08/18 (CHF exacerbation with cardioversion); returned to hospital 11/09/18 with ruptured large L LE hematoma.  Pt admitted with acute blood loss anemia from large L LE hematoma with bleeding (likely secondary anticoagulation therapy for a-fib, MVR, and pacemaker).  S/p debridement of L LE hematoma 11/11/18.  11/13/18 s/p dressing change of L LE wound with application of Neox graft, 5/19 dressing change, 5/21 dressing change and application of Acell.  PMH includes cardioversion 11/06/18, chronic a-fib, chronic back pain, systolic and diastolic CHF, CKD, compression fx back, pacemaker, s/p MVR, pulmonary htn, chronic venous insufficiency, B LE edema, and chronic 3 L O2 at home.   OT comments  Pt was sitting on BSC upon arrival and was in forward flexion with SOB and c/o fatigue.  Current O2 at 3.5L and reviewed pursed lip breathing with emphasis on deep abdominal vs chest breathing.  Transfer from [facility] to chair with min assist and mod cues with rest break once sitting.  Discussed rec to help pt with ADLs and rec use of 2 handled cup to accomodate hand tremor and weakness, extension handle for recliner and use of reacher and sock aid if pt is able to help with LB dressing skills but stated she has a caregiver 5 days a week to do this for her since he is not able to.  Transfer from chair to bed with min assist again with cues and assist to bring legs up into bed with instructions from pt to only hold onto her heels and not her legs which were followed.  Good participation despite feeling tired and SOB. Continue to rec SNF after DC.    Follow Up Recommendations  SNF    Equipment Recommendations       Recommendations for Other Services      Precautions / Restrictions Precautions Precautions:  Fall Precaution Comments: Per Dr Bary Castilla, pt has no weight bearing precautions Restrictions Weight Bearing Restrictions: No       Mobility Bed Mobility               General bed mobility comments: seated in standard chair upon arrival, at end of session  Transfers Overall transfer level: Needs assistance Equipment used: Rolling walker (2 wheeled) Transfers: Sit to/from Stand Sit to Stand: Min guard;Min assist         General transfer comment: improving carry-over of hand placement with movement transition    Balance Overall balance assessment: Needs assistance Sitting-balance support: No upper extremity supported;Feet supported Sitting balance-Leahy Scale: Good     Standing balance support: Bilateral upper extremity supported Standing balance-Leahy Scale: Fair                             ADL either performed or assessed with clinical judgement   ADL Overall ADL's : Needs assistance/impaired                                       General ADL Comments: Pt was sitting on BSC upon arrival and was in forward flexion with SOB and c/o fatigue.  Current O2 at 3.5L and reviewed pursed lip breathing with emphasis on deep abdominal vs chest breathing.  Transfer from [facility]  to chair with min assist and mod cues with rest break once sitting.  Discussed rec to help pt with ADLs and rec use of 2 handled cup to accomodate hand tremor and weakness, extension handle for recliner and use of reacher and sock aid if pt is able to help with LB dressing skills but stated she has a caregiver 5 days a week to do this for her since he is not able to.  Transfer from chair to bed with min assist again with cues and assist to bring legs up into bed with instructions from pt to only hold onto her heels and not her legs which were followed.  Good participation despite feeling tired and SOB.       Vision Baseline Vision/History: Wears glasses Wears Glasses: At all  times Patient Visual Report: No change from baseline     Perception     Praxis      Cognition Arousal/Alertness: Awake/alert Behavior During Therapy: WFL for tasks assessed/performed Overall Cognitive Status: Within Functional Limits for tasks assessed                                          Exercises Other Exercises Other Exercises: Seated LE therex, 1x10, AROM for muscular strength/endurance: ankle pumps, LAQs, marching, iso hip adduct, alt UE/LE flex/ext, chair pushups.  Fair isolated strength; min cuing for technique.  Issued handout with written/pictorial descriptions of seated therex for use as HEP; encouraged performance x1 additional time this evening before bed.  Patient/daughter voiced understanding and awareness. Other Exercises: Continued to reinforce importance of weaning from [place] and increasing OOB activities; with encouragement, patient voices understanding and agreement with plan (goal of weaning to no purewick for daytime hours)   Shoulder Instructions       General Comments      Pertinent Vitals/ Pain       Pain Assessment: Faces Pain Score: 2  Faces Pain Scale: Hurts a little bit Pain Location: L LE when she moves. Pain Descriptors / Indicators: Aching;Grimacing;Guarding Pain Intervention(s): Limited activity within patient's tolerance;Monitored during session;Repositioned;Utilized relaxation techniques  Home Living                                          Prior Functioning/Environment              Frequency  Min 2X/week        Progress Toward Goals  OT Goals(current goals can now be found in the care plan section)  Progress towards OT goals: Progressing toward goals  Acute Rehab OT Goals Patient Stated Goal: to go home OT Goal Formulation: With patient Time For Goal Achievement: 12/09/18 Potential to Achieve Goals: Good  Plan Discharge plan remains appropriate    Co-evaluation                  AM-PAC OT "6 Clicks" Daily Activity     Outcome Measure   Help from another person eating meals?: None Help from another person taking care of personal grooming?: A Little Help from another person toileting, which includes using toliet, bedpan, or urinal?: A Little Help from another person bathing (including washing, rinsing, drying)?: A Lot Help from another person to put on and taking off regular upper body clothing?: A Little Help from another person  to put on and taking off regular lower body clothing?: A Lot 6 Click Score: 17    End of Session Equipment Utilized During Treatment: Gait belt  OT Visit Diagnosis: Other abnormalities of gait and mobility (R26.89);Muscle weakness (generalized) (M62.81);Pain Pain - Right/Left: Left Pain - part of body: Leg   Activity Tolerance Patient limited by fatigue   Patient Left in bed;with call bell/phone within reach;with bed alarm set(her daughter had stepped out for a while but said would return around 4pm but not present and bed alarm on)   Nurse Communication          Time: 2552-5894 OT Time Calculation (min): 28 min  Charges: OT General Charges $OT Visit: 1 Visit OT Treatments $Self Care/Home Management : 23-37 mins  Chrys Racer, OTR/L ascom [phone] 11/26/18, 4:17 PM

## 2018-11-26 NOTE — Plan of Care (Signed)
  Problem: Clinical Measurements: Goal: Respiratory complications will improve Outcome: Progressing Goal: Cardiovascular complication will be avoided Outcome: Progressing   Problem: Pain Managment: Goal: General experience of comfort will improve Outcome: Progressing

## 2018-11-26 NOTE — Progress Notes (Signed)
Dressing changed as ordered/ pts daughter, [name] at bedside/ tolerated well

## 2018-11-26 NOTE — Progress Notes (Signed)
Nutrition Follow Up Note   DOCUMENTATION CODES:   Not applicable  INTERVENTION:   [name] daughter is providing Boost from home   Magic cup TID with meals, each supplement provides 290 kcal and 9 grams of protein  MVI daily   Vitamin C 250mg  po BID  NUTRITION DIAGNOSIS:   Increased nutrient needs related to wound healing as evidenced by increased estimated needs.  GOAL:   Patient will meet greater than or equal to 90% of their needs  -progressing   MONITOR:   PO intake, Supplement acceptance, Labs, Weight trends, Skin, I & O's  ASSESSMENT:   84 year old female with past medical history significant for mitral valve replacement with a mechanical valve on chronic Coumadin, atrial fibrillation/flutter status post pacemaker, CKD IV, hypertension, chronic systolic heart failure, lymphedema, GERD and hypertension, pulmonary hypertension on chronic 2 L home oxygen brought from home secondary to left lower extremity hematoma.  RD working remotely.  Pt with improving appetite and oral intake. Pt is drinking Boost brought from home by her daughter. Per chart, pt is weight stable. Recommend continue supplements and vitamins to support wound healing. Magic Cups being added to meal trays. Can consider adding appetite stimulants if needed.   Unable to reach pt's daughter by phone. Left voicemail for daughter to return call to RD. Will see if daughter is able to keep providing Boost supplements to patient as she refuses to drink Ensure.   Medications reviewed and include: acidophilus, vitamin D, MVI, Miralax, torsemide, vitamin C, warfarin, zinc  Labs reviewed: K 4.1 wnl, BUN 36(H), creat 1.31(H)- 5/22 Hgb 9.0(L), Hct 29.9(L)- 5/27  Diet Order:   Diet Order            Diet regular Room service appropriate? Yes; Fluid consistency: Thin  Diet effective now             EDUCATION NEEDS:   Not appropriate for education at this time  Skin:  Skin Assessment: Reviewed RN  Assessment(Incision L leg )  Last BM:  5/28- type 4  Height:   Ht Readings from Last 1 Encounters:  11/19/18 4\' 11"  (1.499 m)    Weight:   Wt Readings from Last 1 Encounters:  11/26/18 66.2 kg    Ideal Body Weight:  44.5 kg  BMI:  Body mass index is 29.47 kg/m.  Estimated Nutritional Needs:   Kcal:  1500-1700kcal/day  Protein:  75-85g/day   Fluid:  >1.2L/day   Koleen Distance MS, RD, LDN Pager #- [phone] Office#- [phone] After Hours Pager: [phone]

## 2018-11-26 NOTE — Progress Notes (Signed)
Dressing changed performed on  Left leg as ordered/ pts daughter and unit director at bedside to assist/ pt tolerated well/ new area of redness and skin irritation noted to top of Left foot under big toe/ skin intact/ pt and pts daughter request that I extend ACE wrap used for Leg wound over pts foot also/ will monitor.

## 2018-11-27 LAB — CBC
HCT: 29.6 % — ABNORMAL LOW (ref 36.0–46.0)
Hemoglobin: 8.8 g/dL — ABNORMAL LOW (ref 12.0–15.0)
MCH: 27.6 pg (ref 26.0–34.0)
MCHC: 29.7 g/dL — ABNORMAL LOW (ref 30.0–36.0)
MCV: 92.8 fL (ref 80.0–100.0)
Platelets: 171 10*3/uL (ref 150–400)
RBC: 3.19 MIL/uL — ABNORMAL LOW (ref 3.87–5.11)
RDW: 16.2 % — ABNORMAL HIGH (ref 11.5–15.5)
WBC: 4.6 10*3/uL (ref 4.0–10.5)
nRBC: 0 % (ref 0.0–0.2)

## 2018-11-27 LAB — PROTIME-INR
INR: 2.7 — ABNORMAL HIGH (ref 0.8–1.2)
Prothrombin Time: 28.5 seconds — ABNORMAL HIGH (ref 11.4–15.2)

## 2018-11-27 MED ORDER — ACETAMINOPHEN 325 MG PO TABS
650.0000 mg | ORAL_TABLET | Freq: Once | ORAL | Status: AC
  Administered 2018-11-27: 650 mg via ORAL
  Filled 2018-11-27: qty 2

## 2018-11-27 MED ORDER — WARFARIN SODIUM 3 MG PO TABS
3.0000 mg | ORAL_TABLET | Freq: Once | ORAL | Status: AC
  Administered 2018-11-27: 3 mg via ORAL
  Filled 2018-11-27: qty 1

## 2018-11-27 NOTE — Progress Notes (Addendum)
Physical Therapy Treatment Patient Details Name: [name] MRN: [id number] DOB: 1935/03/07 Today's Date: 11/27/2018    History of Present Illness Pt is an 84 y.o. female with recent hospital discharge 11/08/18 (CHF exacerbation with cardioversion); returned to hospital 11/09/18 with ruptured large L LE hematoma.  Pt admitted with acute blood loss anemia from large L LE hematoma with bleeding (likely secondary anticoagulation therapy for a-fib, MVR, and pacemaker).  S/p debridement of L LE hematoma 11/11/18.  11/13/18 s/p dressing change of L LE wound with application of Neox graft, 5/19 dressing change, 5/21 dressing change and application of Acell.  PMH includes cardioversion 11/06/18, chronic a-fib, chronic back pain, systolic and diastolic CHF, CKD, compression fx back, pacemaker, s/p MVR, pulmonary htn, chronic venous insufficiency, B LE edema, and chronic 3 L O2 at home.    PT Comments    Patient in bed upon arrival to room, but purewick removed.  Patient reports just returning to bed after toileting and up in chair x3 hours this AM.  Continues to demonstrate progressive increase in gait distance (75' x2) with RW, improving comfort/confidence with gait efforts.  Slow and steady, but no overt buckling or LOB.  O2 82-83% on 6L after gait trial; improves to >90% within 90-120 seconds of seated rest.  Initiated standing activities this date (to increase functional challenge); tolerated well, but does require UE support for optimal balance/safety with all standing activities. Generally improved affect and overall participation this date. OT at bedside end of session to complete ADL/dressing routine with patient.  OT to adjust O2 back to 4L once activity/exertion complete and to connect chair alarm prior to leaving room.  Current plan of care remains appropriate for patient; demonstrating expected progress given continued progress with skilled therapy services.  Goals extended to reflect progress  and extended POC (x2 additional weeks).  Anticipating discharge 11/30/18 to STR.    Follow Up Recommendations  SNF     Equipment Recommendations  Wheelchair (measurements PT);Wheelchair cushion (measurements PT);3in1 (PT);Hospital bed;Rolling walker with 5" wheels    Recommendations for Other Services       Precautions / Restrictions Precautions Precautions: Fall Precaution Comments: Per Dr Bary Castilla, pt has no weight bearing precautions Restrictions Weight Bearing Restrictions: No    Mobility  Bed Mobility Overal bed mobility: Modified Independent Bed Mobility: Supine to Sit              Transfers Overall transfer level: Needs assistance Equipment used: Rolling walker (2 wheeled) Transfers: Sit to/from Stand Sit to Stand: Min assist         General transfer comment: cuing to scoot to edge of bed prior to lift off; requires UE support and +1 assist  Ambulation/Gait Ambulation/Gait assistance: Min assist Gait Distance (Feet): 75 Feet(x2) Assistive device: Rolling walker (2 wheeled)       General Gait Details: reciprocal stepping pattern, decreased step height/length bilat, forward trunk flexion; broad turning radius; no overt buckling or LOB.  Progressive increase in distance   Stairs             Wheelchair Mobility    Modified Rankin (Stroke Patients Only)       Balance Overall balance assessment: Needs assistance Sitting-balance support: No upper extremity supported;Feet supported Sitting balance-Leahy Scale: Good     Standing balance support: Bilateral upper extremity supported Standing balance-Leahy Scale: Fair Standing balance comment: requires RW for UE support for external stabilization and overall energy conservation  Cognition Arousal/Alertness: Awake/alert Behavior During Therapy: WFL for tasks assessed/performed Overall Cognitive Status: Within Functional Limits for tasks assessed                                         Exercises Other Exercises Other Exercises: Standing UE/LE therex with RW, cga/min assist: heel raises, marching, 1x10; mini squats, 1x5; UE reaching (shoulder height only) to promote postural extension, 1x10 bilat.  Toilet transfer, ambulatory with RW, cga; sit/stand from [facility] with RW, min assist; dep for hygiene (patient declined attempts at self care), min assist for managing undergarments.    General Comments        Pertinent Vitals/Pain Pain Assessment: No/denies pain    Home Living                      Prior Function            PT Goals (current goals can now be found in the care plan section) Acute Rehab PT Goals Patient Stated Goal: to go home PT Goal Formulation: With patient Time For Goal Achievement: 12/11/18 Potential to Achieve Goals: Fair Progress towards PT goals: (Goals updated to reflect progress and extension of POC)    Frequency    Min 2X/week      PT Plan Current plan remains appropriate    Co-evaluation              AM-PAC PT "6 Clicks" Mobility   Outcome Measure  Help needed turning from your back to your side while in a flat bed without using bedrails?: A Little Help needed moving from lying on your back to sitting on the side of a flat bed without using bedrails?: A Little Help needed moving to and from a bed to a chair (including a wheelchair)?: A Little Help needed standing up from a chair using your arms (e.g., wheelchair or bedside chair)?: A Little Help needed to walk in hospital room?: A Little Help needed climbing 3-5 steps with a railing? : A Little 6 Click Score: 18    End of Session Equipment Utilized During Treatment: Gait belt;Oxygen Activity Tolerance: Patient tolerated treatment well Patient left: in chair;with call bell/phone within reach(OT in room for session; to return O2 to 4L and connect chair alarm end of OT session) Nurse Communication: Mobility status PT Visit  Diagnosis: Other abnormalities of gait and mobility (R26.89);Muscle weakness (generalized) (M62.81);Difficulty in walking, not elsewhere classified (R26.2);Pain Pain - Right/Left: Left Pain - part of body: Leg     Time: 6659-9357 PT Time Calculation (min) (ACUTE ONLY): 43 min  Charges:  $Gait Training: 8-22 mins $Therapeutic Exercise: 8-22 mins $Therapeutic Activity: 8-22 mins                     Melquiades Kovar H. Owens Shark, PT, DPT, NCS 11/27/18, 3:37 PM [phone]

## 2018-11-27 NOTE — Plan of Care (Signed)
  Problem: Clinical Measurements: Goal: Will remain free from infection Outcome: Progressing Goal: Respiratory complications will improve Outcome: Progressing   Problem: Activity: Goal: Risk for activity intolerance will decrease Outcome: Progressing   

## 2018-11-27 NOTE — Care Management Important Message (Signed)
Important Message  Patient Details  Name: [name] MRN: [id number] Date of Birth: 04-18-1935   Medicare Important Message Given:  Yes    Juliann Pulse A Neeti Knudtson 11/27/2018, 11:26 AM

## 2018-11-27 NOTE — Progress Notes (Signed)
Dressing changed as ordered, daughter [name] at bedside, patient tolerated well, asking for Tylenol now though not available through current PRN order, MD notified and verbal order for one time dose given.

## 2018-11-27 NOTE — Progress Notes (Signed)
[facility] NAME: [name]    MR#:  [id number]  DATE OF BIRTH:  11/24/1934  SUBJECTIVE:   Patient resting quietly. Patient has been of pure week during the daytime. She has gotten up to bedside commode couple times.  [name] not in the room during my visit earlier tells me Dr. Bary Castilla paid a visit.  REVIEW OF SYSTEMS:  Review of Systems  Constitutional: Negative for chills and fever.  HENT: Negative for hearing loss and tinnitus.   Eyes: Negative for blurred vision and double vision.  Respiratory: Positive for shortness of breath. Negative for cough.   Cardiovascular: Negative for chest pain and palpitations.  Gastrointestinal: Negative for abdominal pain, heartburn, nausea and vomiting.  Genitourinary: Negative for dysuria and urgency.  Musculoskeletal: Negative for back pain and myalgias.  Skin: Negative for itching and rash.  Neurological: Positive for weakness. Negative for dizziness and headaches.  Psychiatric/Behavioral: Negative for depression and hallucinations.    DRUG ALLERGIES:   Allergies  Allergen Reactions   Ace Inhibitors     Cough   Doxycycline Nausea Only   Hydrocodone Itching and Nausea Only   Mercury     Other reaction(s): Unknown   Silver Dermatitis    Severe itching   Cephalexin Rash   Clindamycin/Lincomycin Rash   Nickel Rash   Penicillins Rash    Has patient had a PCN reaction causing immediate rash, facial/tongue/throat swelling, SOB or lightheadedness with hypotension: YES Has patient had a PCN reaction causing severe rash involving mucus membranes or skin necrosis: NO Has patient had a PCN reaction that required hospitalization NO Has patient had a PCN reaction occurring within the last 10 years: NO If all of the above answers are "NO", then may proceed with Cephalosporin use.   VITALS:  Blood pressure 118/60, pulse 70, temperature (!) 97.5 F (36.4 C), temperature  source Oral, resp. rate 18, height 4\' 11"  (1.499 m), weight 65.9 kg, SpO2 (!) 82 %. PHYSICAL EXAMINATION:   GENERAL:  84 y.o.-year-old elderly patient lying in the bed with no acute distress.  EYES: Pupils equal, round, reactive to light and accommodation. No scleral icterus. Extraocular muscles intact.  HEENT: Head atraumatic, normocephalic. Oropharynx and nasopharynx clear.  NECK:  Supple, no jugular venous distention. No thyroid enlargement, no tenderness.  LUNGS: Normal breath sounds bilaterally, no wheezing, rhonchi or crepitation.  Decreased bibasilar breath sounds. no use of accessory muscles of respiration.  CARDIOVASCULAR: S1, S2 normal. No  rubs, or gallops.  ABDOMEN: Soft, nontender, nondistended. Bowel sounds present.  EXTREMITIES: + pedal edema,no cyanosis, or clubbing.  Left leg in Ace wrap dressingNo active bleeding noticed bruising noticed no evidence of any bleeding clinically. toes.  Above on the right leg NEUROLOGIC:   Sensation intact. Gait not checked.  Global weakness  PSYCHIATRIC: The patient is alert and oriented x 3 SKIN: as above  LABORATORY PANEL:  Female CBC Recent Labs  Lab 11/27/18 0345  WBC 4.6  HGB 8.8*  HCT 29.6*  PLT 171   ------------------------------------------------------------------------------------------------------------------ Chemistries  No results for input(s): NA, K, CL, CO2, GLUCOSE, BUN, CREATININE, CALCIUM, MG, AST, ALT, ALKPHOS, BILITOT in the last 168 hours.  Invalid input(s): GFRCGP RADIOLOGY:  No results found. ASSESSMENT AND PLAN:   84 year old female with past medical history significant for mitral valve replacement with a mechanical valve on chronic Coumadin, atrial fibrillation/flutter status post pacemaker, hypertension, chronic systolic heart failure, chronic bilateral lymphedema, GERD and hypertension, pulmonary hypertension on  chronic 2 L home oxygen brought from home secondary to left lower extremity hematoma.  #  Left lower extremity hematoma-after a trauma while on Coumadin. -Appreciate surgical consult.  Patient had debridement of the left calf hematoma and a biological graft placed in on 11/11/2018..  -Dr.Byrnett discussed with [name], DO from plastics.  Patient is s/p Acell placement on 11/19/2018 -RN to follow dressing change per Dr Elenor Quinones recommendation. Please change dressing every  day starting 11/24/18 with KY gel to the adaptic and then wrap the leg with kerlex and ABD and Ace wrap [phone] with any questions.  Dr. Marla Roe -Wound culture with Enterobacter, few basilllus species--completed 10 days of po cipro per sensitivity -Seen by Dr Trula Slade per dter's request -there is no further surgery scheduled per plastic surgeon Dr. [name]/dr byrnett.  -pt will f/u Dr Bary Castilla as out pt (Surgery will stop by at TL)  # Acute blood loss anemia-secondary to bleeding into her hematoma -Received 3 units packed RBC transfusion this admission.   - hemoglobin at 7.1 with 1 unit of blood transfusion at 8.7--9.2--9.0-8.6--9.0  # Acute on chronic systolic heart failure exacerbation- EF 20-25% - Remains on chronic 3 L oxygen. -  Patient has a pacemaker, atrial flutter/fibrillation status post DC cardioversion recently - cont torsemide 40 mg p.o. daily.   -No ACE inhibitor due to underlying CKD and relative hypotension per Cardiology. [name] aware -Last echo with EF of 25% -Pt does desat into the 80's with exertion and recovers after few mins during PT  Oxygen requirements go up temporarily but she settles down there after.  4.  History of mechanical mitral valve surgery-Coumadin has been restarted - Coumadin and monitor PT/INR -pharmacy managing it  5. CKD stage IV Renal function remains fairly stable at this time with creatinine of 1.7-1.4-1.36  6.  Generalized weakness physical therapy recommending skilled nursing facility  7. Bilateral lower extremity chronic  lymphedema -family (cindy and [place]) mentioned patient has Ted hose which they have brought in from home and would like patient to use it. I specifically asked patient if we can use the Ted hose on the right lower extremity to prevent edema and hematomas and she declined immediately that she is not going to allow anybody to put it on. -keep leg elevated when in bed  Goals of care discussed at length. D/w pt and dter  we will need to work towards discharge planning. Pt has a bed at [facility]. [name]  wants to mentally be prepared for pt (mom ) to go to [place] and wants to wait till Monday. Pt is at baseline and overall ok for discharge. She has multiple co-morbidities and is at a high risk for readmisison  Evette RN was present during the conversation.  [name] purwick will be removed during the daytime. She can have it at nighttime only. [name] agrees with it  Dr Tressia Miners will be assuming care of pt from tomorrow-- pt informed. CODE STATUS: full code TOTAL TIME TAKING CARE OF THIS PATIENT: 45 minutes.   More than 50% of the time was spent in counseling/coordination of care: YES  POSSIBLE D/C On Monday to [facility]  Fritzi Mandes M.D on 11/27/2018 at 4:07 PM  Between 7am to 6pm - Pager - [phone]  After 6pm go to [email or web address] - Proofreader  [facility]  Office  [phone]  CC: Primary care physician; Rusty Aus, MD  Note: This dictation was prepared with Dragon dictation along with smaller  Company secretary. Any transcriptional errors that result from this process are unintentional.

## 2018-11-27 NOTE — Progress Notes (Signed)
Occupational Therapy Treatment Patient Details Name: [name] MRN: [id number] DOB: 09/13/1934 Today's Date: 11/27/2018    History of present illness Pt is an 84 y.o. female with recent hospital discharge 11/08/18 (CHF exacerbation with cardioversion); returned to hospital 11/09/18 with ruptured large L LE hematoma.  Pt admitted with acute blood loss anemia from large L LE hematoma with bleeding (likely secondary anticoagulation therapy for a-fib, MVR, and pacemaker).  S/p debridement of L LE hematoma 11/11/18.  11/13/18 s/p dressing change of L LE wound with application of Neox graft, 5/19 dressing change, 5/21 dressing change and application of Acell.  PMH includes cardioversion 11/06/18, chronic a-fib, chronic back pain, systolic and diastolic CHF, CKD, compression fx back, pacemaker, s/p MVR, pulmonary htn, chronic venous insufficiency, B LE edema, and chronic 3 L O2 at home.   OT comments  Pt. performed UE, and LE dressing requiring minA to a shirt requiring assist to reach up and donn the shirt over her head. Pt. Required modA to use a reacher to donn pants over her feet. Pt. required minA to hike her pants (loose stretchy pajama/lounge pants) Pt. SO2 at 6L during activity with O2 88L. O2 recovered quickly to 96% with cues for pursed lip breathing. At the end of the session SO2 was returned to 4LO2  with SO2 at 93%. Pt. education was provided about energy conservation, and work simplification techniques. Pt. Continues to benefit from OT services for ADL training, continued A/E training, and pt. education about home modification, and DME. Pt. Continues to be appropriate for SNF level of care with follow-up OT services upon discharge.    Follow Up Recommendations  SNF    Equipment Recommendations       Recommendations for Other Services      Precautions / Restrictions Precautions Precautions: Fall Precaution Comments: Per Dr Bary Castilla, pt has no weight bearing  precautions Restrictions Weight Bearing Restrictions: No LLE Weight Bearing: Non weight bearing       Mobility Bed Mobility Overal bed mobility: Modified Independent Bed Mobility: Supine to Sit       Sit to supine: Min assist(Assist with LEs at bilateral ankles secondary to sensitivity in her LEs.)      Transfers Overall transfer level: Needs assistance Equipment used: Rolling walker (2 wheeled) Transfers: Sit to/from Stand Sit to Stand: Min assist         General transfer comment: cuing to scoot to edge of bed prior to lift off; requires UE support and +1 assist    Balance Overall balance assessment: Needs assistance Sitting-balance support: No upper extremity supported;Feet supported Sitting balance-Leahy Scale: Good     Standing balance support: Bilateral upper extremity supported Standing balance-Leahy Scale: Fair Standing balance comment: requires RW for UE support for external stabilization and overall energy conservation                           ADL either performed or assessed with clinical judgement   ADL Overall ADL's : Needs assistance/impaired     Grooming: Set up;Sitting;Supervision/safety   Upper Body Bathing: Minimal assistance   Lower Body Bathing: Set up;Maximal assistance   Upper Body Dressing : Minimal assistance Upper Body Dressing Details (indicate cue type and reason): Assist was required to donn a pull over shirt over her head.  Lower Body Dressing: Moderate assistance Lower Body Dressing Details (indicate cue type and reason): with reacher use  Vision Baseline Vision/History: Wears glasses Wears Glasses: At all times Patient Visual Report: No change from baseline     Perception     Praxis      Cognition Arousal/Alertness: Awake/alert Behavior During Therapy: WFL for tasks assessed/performed Overall Cognitive Status: Within Functional Limits for tasks assessed                                           Exercises   Shoulder Instructions       General Comments      Pertinent Vitals/ Pain       Pain Assessment: No/denies pain  Home Living                                          Prior Functioning/Environment              Frequency  Min 2X/week        Progress Toward Goals  OT Goals(current goals can now be found in the care plan section)     Acute Rehab OT Goals Patient Stated Goal: to go home OT Goal Formulation: With patient Potential to Achieve Goals: Good  Plan Discharge plan remains appropriate    Co-evaluation                 AM-PAC OT "6 Clicks" Daily Activity     Outcome Measure   Help from another person eating meals?: None Help from another person taking care of personal grooming?: A Little Help from another person toileting, which includes using toliet, bedpan, or urinal?: A Little Help from another person bathing (including washing, rinsing, drying)?: A Lot Help from another person to put on and taking off regular upper body clothing?: A Little Help from another person to put on and taking off regular lower body clothing?: A Lot 6 Click Score: 17    End of Session Equipment Utilized During Treatment: Gait belt  Pain - Right/Left: Left Pain - part of body: Leg   Activity Tolerance Patient limited by fatigue   Patient Left in bed;with call bell/phone within reach;with bed alarm set   Nurse Communication          Time: 8546-2703 OT Time Calculation (min): 23 min  Charges: OT General Charges $OT Visit: 1 Visit OT Treatments $Self Care/Home Management : 23-37 mins  Harrel Carina, MS, OTR/L  Harrel Carina 11/27/2018, 4:49 PM

## 2018-11-27 NOTE — Progress Notes (Signed)
[place] for  Warfarin  Indication: atrial fibrillation, Mechanical Mitral Valve  Allergies  Allergen Reactions  . Ace Inhibitors     Cough  . Doxycycline Nausea Only  . Hydrocodone Itching and Nausea Only  . Mercury     Other reaction(s): Unknown  . Silver Dermatitis    Severe itching  . Cephalexin Rash  . Clindamycin/Lincomycin Rash  . Nickel Rash  . Penicillins Rash    Has patient had a PCN reaction causing immediate rash, facial/tongue/throat swelling, SOB or lightheadedness with hypotension: YES Has patient had a PCN reaction causing severe rash involving mucus membranes or skin necrosis: NO Has patient had a PCN reaction that required hospitalization NO Has patient had a PCN reaction occurring within the last 10 years: NO If all of the above answers are "NO", then may proceed with Cephalosporin use.    Patient Measurements: Height: 4\' 11"  (149.9 cm) Weight: 145 lb 4.8 oz (65.9 kg) IBW/kg (Calculated) : 43.2 Heparin Dosing Weight: 62.6 kg  Vital Signs: Temp: 97.5 F (36.4 C) (05/29 0749) Temp Source: Oral (05/29 0749) BP: 118/60 (05/29 0749) Pulse Rate: 70 (05/29 0749)  Labs: Recent Labs    11/25/18 0336 11/25/18 0934 11/26/18 0349 11/27/18 0345  HGB  --  9.0*  --  8.8*  HCT  --  29.9*  --  29.6*  PLT  --  185  --  171  LABPROT 31.7*  --  33.6* 28.5*  INR 3.1*  --  3.4* 2.7*    Estimated Creatinine Clearance: 27.8 mL/min (A) (by C-G formula based on SCr of 1.31 mg/dL (H)).   Medical History: Past Medical History:  Diagnosis Date  . Arthritis    "back, hands, knees" (04/10/2016)  . Chronic atrial fibrillation    a. On Coumadin  . Chronic back pain   . 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 EF 25% by echo in 08/2015 b. RHC in 08/2015 showed normal filling pressures  . Chronic kidney disease   . Compression fracture    "several; all in my back" (04/10/2016)   . GERD (gastroesophageal reflux disease)   . Hypertension   . Lymphedema   . Pacemaker 05/2017  . S/P MVR (mitral valve replacement)    a. MVR 1994 b. redo MVR in 08/2014 - on Coumadin  . Shortness of breath dyspnea      Assessment: Pharmacy was consulted to start heparin bridge with warfarin. Pt was on warfarin previously for atrial fibrillation with mechanical mitral valve. Per coumadin clinic notes, patient's INR goal is 2.5 - 3.5. Her home evening dose of warfarin is: 6 mg MWF and 3 mg TThSaSun. On admission patient's INR was 3.5 and patient had a hematoma in her leg. Pt went for I&D on 5/13. Vitamin K was given 5/12 and INR has came down to 1.2 for procedure.  Drug interactions: Amiodarone which can increase INR.   Date INR Warfarin Dose  5/11 3.2 Held  5/12 2.8 Held and gave Vitamin K IV  5/13 1.2 6 mg  5/14 1.2 6 mg  5/15 1.3 6 mg  5/16 1.6 6 mg   5/17 1.9 --  5/18 2.3 -- Not given due to pt being lethargic  5/19 2.0 6 mg   5/20 2.4 No dose ordered/charted?  5/21 2.1  5mg   5/22 2.2  4 mg  5/23 2.2  5 mg  5/24 2.1 6 mg  5/25 2.4  6mg   5/26 2.6 5mg   5/27 3.1 Hold dose  5/28 3.4 Hold dose  5/29 2.7 3mg    Cardiology recommended goal INR around 2.5 given her increased risk of bleeding  Goal of Therapy:  INR 2-3, given recent bleed (per cardiology, even with valve). Monitor platelets by anticoagulation protocol: Yes   Plan:   Home regimen is as stated above with a total weekly dose of 30mg .  INR 2.7 - Major DDI with amiodarone (home med) AND DDI w/Cipro (added 5/21 - now complete)  Will resume a simplified regimen of 3mg  daily (this would be 9mg  less weekly than current home regimen and patient arrived with INR 3.2 from home regimen).   Pharmacy will continue to monitor and adjust per consult.   Lu Duffel, PharmD, BCPS Clinical Pharmacist 11/27/2018 8:31 AM

## 2018-11-28 LAB — GLUCOSE, CAPILLARY: Glucose-Capillary: 138 mg/dL — ABNORMAL HIGH (ref 70–99)

## 2018-11-28 LAB — PROTIME-INR
INR: 2.6 — ABNORMAL HIGH (ref 0.8–1.2)
Prothrombin Time: 27.6 seconds — ABNORMAL HIGH (ref 11.4–15.2)

## 2018-11-28 MED ORDER — FUROSEMIDE 40 MG PO TABS
40.0000 mg | ORAL_TABLET | Freq: Every day | ORAL | Status: DC
  Administered 2018-11-29 – 2018-11-30 (×2): 40 mg via ORAL
  Filled 2018-11-28 (×2): qty 1

## 2018-11-28 MED ORDER — WARFARIN SODIUM 3 MG PO TABS
3.0000 mg | ORAL_TABLET | Freq: Once | ORAL | Status: AC
  Administered 2018-11-28: 19:00:00 3 mg via ORAL
  Filled 2018-11-28: qty 1

## 2018-11-28 NOTE — Progress Notes (Signed)
[place] for  Warfarin  Indication: atrial fibrillation, Mechanical Mitral Valve  Allergies  Allergen Reactions  . Ace Inhibitors     Cough  . Doxycycline Nausea Only  . Hydrocodone Itching and Nausea Only  . Mercury     Other reaction(s): Unknown  . Silver Dermatitis    Severe itching  . Cephalexin Rash  . Clindamycin/Lincomycin Rash  . Nickel Rash  . Penicillins Rash    Has patient had a PCN reaction causing immediate rash, facial/tongue/throat swelling, SOB or lightheadedness with hypotension: YES Has patient had a PCN reaction causing severe rash involving mucus membranes or skin necrosis: NO Has patient had a PCN reaction that required hospitalization NO Has patient had a PCN reaction occurring within the last 10 years: NO If all of the above answers are "NO", then may proceed with Cephalosporin use.    Patient Measurements: Height: 4\' 11"  (149.9 cm) Weight: 148 lb 9.4 oz (67.4 kg) IBW/kg (Calculated) : 43.2 Heparin Dosing Weight: 62.6 kg  Vital Signs: Temp: 98.2 F (36.8 C) (05/30 0632) Temp Source: Oral (05/30 3846) BP: 138/70 (05/30 6599) Pulse Rate: 79 (05/30 0632)  Labs: Recent Labs    11/25/18 0934 11/26/18 0349 11/27/18 0345 11/28/18 0458  HGB 9.0*  --  8.8*  --   HCT 29.9*  --  29.6*  --   PLT 185  --  171  --   LABPROT  --  33.6* 28.5* 27.6*  INR  --  3.4* 2.7* 2.6*    Estimated Creatinine Clearance: 28.1 mL/min (A) (by C-G formula based on SCr of 1.31 mg/dL (H)).   Medical History: Past Medical History:  Diagnosis Date  . Arthritis    "back, hands, knees" (04/10/2016)  . Chronic atrial fibrillation    a. On Coumadin  . Chronic back pain   . 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 EF 25% by echo in 08/2015 b. RHC in 08/2015 showed normal filling pressures  . Chronic kidney disease   . Compression fracture    "several; all in my back" (04/10/2016)   . GERD (gastroesophageal reflux disease)   . Hypertension   . Lymphedema   . Pacemaker 05/2017  . S/P MVR (mitral valve replacement)    a. MVR 1994 b. redo MVR in 08/2014 - on Coumadin  . Shortness of breath dyspnea      Assessment: Pharmacy was consulted to start heparin bridge with warfarin. Pt was on warfarin previously for atrial fibrillation with mechanical mitral valve. Per coumadin clinic notes, patient's INR goal is 2.5 - 3.5. Her home evening dose of warfarin is: 6 mg MWF and 3 mg TThSaSun. On admission patient's INR was 3.5 and patient had a hematoma in her leg. Pt went for I&D on 5/13. Vitamin K was given 5/12 and INR has came down to 1.2 for procedure.  Drug interactions: Amiodarone which can increase INR.   Date INR Warfarin Dose  5/11 3.2 Held  5/12 2.8 Held and gave Vitamin K IV  5/13 1.2 6 mg  5/14 1.2 6 mg  5/15 1.3 6 mg  5/16 1.6 6 mg   5/17 1.9 --  5/18 2.3 -- Not given due to pt being lethargic  5/19 2.0 6 mg   5/20 2.4 No dose ordered/charted?  5/21 2.1  5mg   5/22 2.2  4 mg  5/23 2.2  5 mg  5/24 2.1 6 mg  5/25 2.4  6mg   5/26 2.6 5mg   5/27 3.1 Hold dose  5/28 3.4 Hold dose  5/29 2.7 3mg   5/30 2.6    Cardiology recommended goal INR around 2.5 given her increased risk of bleeding  Goal of Therapy:  INR 2-3, given recent bleed (per cardiology, even with valve). Monitor platelets by anticoagulation protocol: Yes   Plan:   Home regimen is as stated above with a total weekly dose of 30mg .  INR 2.6 - Major DDI with amiodarone (home med) AND DDI w/Cipro (added 5/21 - now complete)  Will resume a simplified regimen of 3mg  daily (this would be 9mg  less weekly than current home regimen and patient arrived with INR 3.2 from home regimen).   Pharmacy will continue to monitor and adjust per consult.   Pernell Dupre, PharmD, BCPS Clinical Pharmacist 11/28/2018 6:38 AM

## 2018-11-28 NOTE — Progress Notes (Signed)
Called to room, pt back in bed, but before in the room pt with "seizure like activity" and some slurred speech.

## 2018-11-28 NOTE — Progress Notes (Signed)
Physical Therapy Treatment Patient Details Name: [name] MRN: [id number] DOB: 1934/12/21 Today's Date: 11/28/2018    History of Present Illness Pt is an 84 y.o. female with recent hospital discharge 11/08/18 (CHF exacerbation with cardioversion); returned to hospital 11/09/18 with ruptured large L LE hematoma.  Pt admitted with acute blood loss anemia from large L LE hematoma with bleeding (likely secondary anticoagulation therapy for a-fib, MVR, and pacemaker).  S/p debridement of L LE hematoma 11/11/18.  11/13/18 s/p dressing change of L LE wound with application of Neox graft, 5/19 dressing change, 5/21 dressing change and application of Acell.  PMH includes cardioversion 11/06/18, chronic a-fib, chronic back pain, systolic and diastolic CHF, CKD, compression fx back, pacemaker, s/p MVR, pulmonary htn, chronic venous insufficiency, B LE edema, and chronic 3 L O2 at home.    PT Comments    Pt in bed, stating she is ready to get up and walk.  RN in and ok'ed pt for therapy session.  97% at rest 2 lpm.  To edge of bed with supervision.  Stood with walker and ambulated 5' to bedside commode with walker.  After small BM and voiding, she was able to stand for care.  Requested to rest before gait and sat back on commode.  Sats 71%.  O2 increased to 6 lpm and pt returned to min 90's.  After time for recover, she was able to ambulate 100' with walker and min a to manage O2 tubing.  Sats were monitored during gait and remained in high 80's during gait on 6 lpm.  Returned to edge of bed and requested to return to commode.  Transferred with min a x 1.  Pt left on 4.5 lpm while sitting on commode.  Daughter in with pt and nurse tech aware.  RN notified on O2 sats during mobility.   Follow Up Recommendations  SNF     Equipment Recommendations  Wheelchair (measurements PT);Wheelchair cushion (measurements PT);3in1 (PT);Hospital bed;Rolling walker with 5" wheels    Recommendations for Other Services        Precautions / Restrictions Precautions Precautions: Fall Precaution Comments: Per Dr Bary Castilla, pt has no weight bearing precautions Restrictions Weight Bearing Restrictions: No Other Position/Activity Restrictions: Per Dr. Bary Castilla, pt has no weight bearing precautions    Mobility  Bed Mobility Overal bed mobility: Modified Independent Bed Mobility: Supine to Sit     Supine to sit: Supervision Sit to supine: Supervision      Transfers Overall transfer level: Needs assistance Equipment used: Rolling walker (2 wheeled) Transfers: Sit to/from Stand Sit to Stand: Min guard;Min assist            Ambulation/Gait Ambulation/Gait assistance: Herbalist (Feet): 100 Feet Assistive device: Rolling walker (2 wheeled) Gait Pattern/deviations: Step-through pattern;Decreased step length - right;Decreased step length - left;Trunk flexed Gait velocity: decreased       Stairs             Wheelchair Mobility    Modified Rankin (Stroke Patients Only)       Balance Overall balance assessment: Needs assistance Sitting-balance support: No upper extremity supported;Feet supported Sitting balance-Leahy Scale: Good Sitting balance - Comments: forward flexed, kyphotic posture   Standing balance support: Bilateral upper extremity supported Standing balance-Leahy Scale: Fair Standing balance comment: requires RW for UE support for external stabilization and overall energy conservation  Cognition Arousal/Alertness: Awake/alert Behavior During Therapy: WFL for tasks assessed/performed Overall Cognitive Status: Within Functional Limits for tasks assessed                                        Exercises Other Exercises Other Exercises: to commode before and after session.    General Comments        Pertinent Vitals/Pain Pain Assessment: No/denies pain    Home Living                       Prior Function            PT Goals (current goals can now be found in the care plan section) Progress towards PT goals: Progressing toward goals    Frequency    Min 2X/week      PT Plan Current plan remains appropriate    Co-evaluation              AM-PAC PT "6 Clicks" Mobility   Outcome Measure  Help needed turning from your back to your side while in a flat bed without using bedrails?: A Little Help needed moving from lying on your back to sitting on the side of a flat bed without using bedrails?: A Little Help needed moving to and from a bed to a chair (including a wheelchair)?: A Little Help needed standing up from a chair using your arms (e.g., wheelchair or bedside chair)?: A Little Help needed to walk in hospital room?: A Little Help needed climbing 3-5 steps with a railing? : A Little 6 Click Score: 18    End of Session Equipment Utilized During Treatment: Gait belt;Oxygen Activity Tolerance: Patient tolerated treatment well Patient left: Other (comment);with call bell/phone within reach;with family/visitor present Nurse Communication: Other (comment) Pain - Right/Left: Left Pain - part of body: Leg     Time: 1127-1206 PT Time Calculation (min) (ACUTE ONLY): 39 min  Charges:  $Therapeutic Exercise: 23-37 mins $Therapeutic Activity: 8-22 mins                     Chesley Noon, PTA 11/28/18, 12:46 PM

## 2018-11-28 NOTE — Progress Notes (Signed)
Pt was on bsc, get back to bed. [name] O2 saturations in the 70"s on 2 liters. Pt  O2 turned up to 4 liters and sats at 94%. Blood pressure within normal limits, blood sugar normal. Will continue to monitor and assess

## 2018-11-28 NOTE — Progress Notes (Addendum)
[facility] NAME: [name]    MR#:  [id number]  DATE OF BIRTH:  1934-10-18  SUBJECTIVE:  CHIEF COMPLAINT:   Chief Complaint  Patient presents with  . Leg Swelling   -Patient was sitting in a chair eating her breakfast this morning.  Denies any shortness of breath.  Overnight had an episode of dyspnea and hypoxia causing her to feel woozy and her oxygen requirements increased up to 4 L. -Also complains that the right leg swelling at the ankle is worse than yesterday.  REVIEW OF SYSTEMS:  Review of Systems  Constitutional: Positive for malaise/fatigue. Negative for chills and fever.  HENT: Negative for congestion, ear discharge, hearing loss and nosebleeds.   Eyes: Negative for blurred vision and double vision.  Respiratory: Positive for shortness of breath. Negative for cough and wheezing.   Cardiovascular: Positive for leg swelling. Negative for chest pain and palpitations.  Gastrointestinal: Negative for abdominal pain, constipation, diarrhea, nausea and vomiting.  Genitourinary: Negative for dysuria.  Musculoskeletal: Negative for myalgias.  Neurological: Negative for dizziness, seizures and headaches.    DRUG ALLERGIES:   Allergies  Allergen Reactions  . Ace Inhibitors     Cough  . Doxycycline Nausea Only  . Hydrocodone Itching and Nausea Only  . Mercury     Other reaction(s): Unknown  . Silver Dermatitis    Severe itching  . Cephalexin Rash  . Clindamycin/Lincomycin Rash  . Nickel Rash  . Penicillins Rash    Has patient had a PCN reaction causing immediate rash, facial/tongue/throat swelling, SOB or lightheadedness with hypotension: YES Has patient had a PCN reaction causing severe rash involving mucus membranes or skin necrosis: NO Has patient had a PCN reaction that required hospitalization NO Has patient had a PCN reaction occurring within the last 10 years: NO If all of the above answers are "NO",  then may proceed with Cephalosporin use.    VITALS:  Blood pressure (!) 114/49, pulse 66, temperature 97.6 F (36.4 C), temperature source Oral, resp. rate 16, height 4\' 11"  (1.499 m), weight 67.4 kg, SpO2 95 %.  PHYSICAL EXAMINATION:  Physical Exam   GENERAL:  84 y.o.-year-old elderly patient lying in the bed with no acute distress.  EYES: Pupils equal, round, reactive to light and accommodation. No scleral icterus. Extraocular muscles intact.  HEENT: Head atraumatic, normocephalic. Oropharynx and nasopharynx clear.  NECK:  Supple, no jugular venous distention. No thyroid enlargement, no tenderness.  LUNGS: Normal breath sounds bilaterally, no wheezing, rales,rhonchi or crepitation. No use of accessory muscles of respiration.  Decreased breath sounds significantly at the base CARDIOVASCULAR: S1, S2 normal. No  rubs, or gallops.  3/6 systolic murmur is present ABDOMEN: Soft, nontender, nondistended. Bowel sounds present. No organomegaly or mass.  EXTREMITIES: No  cyanosis, or clubbing.  Right leg 1+ ankle edema and leg edema with prior hematomas which are improving.  Left leg is wrapped due to prior hematoma and graft NEUROLOGIC: Cranial nerves II through XII are intact. Muscle strength 5/5 in all extremities. Sensation intact. Gait not checked.  Generalized weakness present PSYCHIATRIC: The patient is alert and oriented x 3.  SKIN: No obvious rash, lesion, or ulcer.    LABORATORY PANEL:   CBC Recent Labs  Lab 11/27/18 0345  WBC 4.6  HGB 8.8*  HCT 29.6*  PLT 171   ------------------------------------------------------------------------------------------------------------------  Chemistries  No results for input(s): NA, K, CL, CO2, GLUCOSE, BUN, CREATININE, CALCIUM, MG, AST, ALT, ALKPHOS,  BILITOT in the last 168 hours.  Invalid input(s): GFRCGP ------------------------------------------------------------------------------------------------------------------  Cardiac Enzymes  No results for input(s): TROPONINI in the last 168 hours. ------------------------------------------------------------------------------------------------------------------  RADIOLOGY:  No results found.  EKG:   Orders placed or performed during the hospital encounter of 11/04/18  . EKG 12-Lead  . EKG 12-Lead    ASSESSMENT AND PLAN:   84 year old female with past medical history significant for mitral valve replacement with a mechanical valve on chronic Coumadin, atrial fibrillation/flutter status post pacemaker, hypertension, chronic systolic heart failure, chronic bilateral lymphedema, GERD and hypertension, pulmonary hypertension on chronic 2 L home oxygen brought from home secondary to left lower extremity hematoma.  # Left lower extremity hematoma-after a trauma while on Coumadin. -Appreciate surgical consult. Patient had debridement of the left calf hematoma and a biological graft placed in on 11/11/2018..  -Dr.Byrnett discussed with [name], DO from plastics. Patient is s/p Acell placement on 11/19/2018 -RN to follow dressing change per Dr Elenor Quinones recommendation. Please change dressing every  day starting 11/24/18 with KY gel to the adaptic and then wrap the leg with kerlex and ABD and Ace wrap [phone] with any questions.  Dr. Marla Roe -Wound culture with Enterobacter, few basilllus species--completed 10 days of po cipro per sensitivity -Seen by Dr Trula Slade from vascular per dter's request -there is no further surgery scheduled per plastic surgeon Dr. [name]/dr byrnett.  -pt will f/u Dr Bary Castilla as out pt (Surgery will stop by at [facility]) -Continue dressing changes as recommended by surgery  #Acute blood loss anemia-secondary to bleeding into her hematoma -Received 3 units packed RBC transfusion this admission.   -Last hemoglobin at 8.8.  Continue to monitor  #Acute on chronic systolic heart failure exacerbation- EF 20-25% -Remains on chronic 3 L oxygen.  - Patient has a pacemaker, atrial flutter/fibrillation status post DC cardioversion recently - cont torsemide 40 mg p.o. daily.    Patient wants it to be changed to Lasix instead of torsemide.  We will change it today. -No ACE inhibitor due to underlying CKD and relative hypotension per Cardiology.   -Last echo with EF of 25% -Pt does desat into the 80's with exertion and recovers after few mins during PT-continue incentive spirometer.  Patient refused a chest x-ray today.  Oxygen requirements go up temporarily but she settles down there after.  4. History of mechanical mitral valve surgery-Coumadin has been restarted - Coumadin and monitor PT/INR -pharmacy managing it, INR is therapeutic  5.CKD stage IV Renal function remains fairly stable at this time with creatinine of 1.7-1.4-1.36  6.  Generalized weakness physical therapy recommending skilled nursing facility  7. Bilateral lower extremity chronic lymphedema -family (cindy and Maloy) mentioned patient has Ted hose which they have brought in from home and would like patient to use it.  However patient refused the TED hose when asked by Dr. Posey Pronto on Friday. -Continue Lasix  -keep leg elevated when in bed  [facility] present during my visit.  [name] purwick will be removed during the daytime. She can have it at nighttime only. Daughter agrees with it  Daughter not at bedside today, left a detailed voicemail to daughter [name] this morning.   All the records are reviewed and case discussed with Care Management/Social Workerr. Management plans discussed with the patient, family and they are in agreement.  CODE STATUS: Full code  TOTAL TIME TAKING CARE OF THIS PATIENT: 39 minutes.   POSSIBLE D/C IN 2 DAYS, DEPENDING ON CLINICAL CONDITION.   Gladstone Lighter M.D on  11/28/2018 at 12:13 PM  Between 7am to 6pm - Pager - [phone]  After 6pm go to [email or web address] - password [email or web address] [facility]   Office  [phone]  CC: Primary care physician; Rusty Aus, MD

## 2018-11-28 NOTE — Progress Notes (Signed)
Notified MD. No new orders at this time. Will continue to monitor and assess

## 2018-11-29 LAB — BASIC METABOLIC PANEL
Anion gap: 8 (ref 5–15)
BUN: 24 mg/dL — ABNORMAL HIGH (ref 8–23)
CO2: 36 mmol/L — ABNORMAL HIGH (ref 22–32)
Calcium: 9.1 mg/dL (ref 8.9–10.3)
Chloride: 97 mmol/L — ABNORMAL LOW (ref 98–111)
Creatinine, Ser: 1.24 mg/dL — ABNORMAL HIGH (ref 0.44–1.00)
GFR calc Af Amer: 47 mL/min — ABNORMAL LOW (ref >60)
GFR calc non Af Amer: 41 mL/min — ABNORMAL LOW (ref >60)
Glucose, Bld: 95 mg/dL (ref 70–99)
Potassium: 3.5 mmol/L (ref 3.5–5.1)
Sodium: 141 mmol/L (ref 135–145)

## 2018-11-29 LAB — CBC
HCT: 29.2 % — ABNORMAL LOW (ref 36.0–46.0)
Hemoglobin: 8.8 g/dL — ABNORMAL LOW (ref 12.0–15.0)
MCH: 27.3 pg (ref 26.0–34.0)
MCHC: 30.1 g/dL (ref 30.0–36.0)
MCV: 90.7 fL (ref 80.0–100.0)
Platelets: 171 10*3/uL (ref 150–400)
RBC: 3.22 MIL/uL — ABNORMAL LOW (ref 3.87–5.11)
RDW: 16.2 % — ABNORMAL HIGH (ref 11.5–15.5)
WBC: 4.1 10*3/uL (ref 4.0–10.5)
nRBC: 0 % (ref 0.0–0.2)

## 2018-11-29 LAB — PROTIME-INR
INR: 2.3 — ABNORMAL HIGH (ref 0.8–1.2)
Prothrombin Time: 25.1 seconds — ABNORMAL HIGH (ref 11.4–15.2)

## 2018-11-29 MED ORDER — POTASSIUM CHLORIDE CRYS ER 20 MEQ PO TBCR
20.0000 meq | EXTENDED_RELEASE_TABLET | Freq: Every day | ORAL | Status: DC
  Administered 2018-11-29 – 2018-11-30 (×2): 20 meq via ORAL
  Filled 2018-11-29 (×2): qty 1

## 2018-11-29 MED ORDER — WARFARIN SODIUM 4 MG PO TABS
4.0000 mg | ORAL_TABLET | Freq: Once | ORAL | Status: AC
  Administered 2018-11-29: 19:00:00 4 mg via ORAL
  Filled 2018-11-29: qty 1

## 2018-11-29 NOTE — Progress Notes (Signed)
Daughter [name] want to see Dr Rockey Situ prior to discharge tomorrow.

## 2018-11-29 NOTE — Progress Notes (Signed)
[facility] for  Warfarin  Indication: atrial fibrillation, Mechanical Mitral Valve  Allergies  Allergen Reactions  . Ace Inhibitors     Cough  . Doxycycline Nausea Only  . Hydrocodone Itching and Nausea Only  . Mercury     Other reaction(s): Unknown  . Silver Dermatitis    Severe itching  . Cephalexin Rash  . Clindamycin/Lincomycin Rash  . Nickel Rash  . Penicillins Rash    Has patient had a PCN reaction causing immediate rash, facial/tongue/throat swelling, SOB or lightheadedness with hypotension: YES Has patient had a PCN reaction causing severe rash involving mucus membranes or skin necrosis: NO Has patient had a PCN reaction that required hospitalization NO Has patient had a PCN reaction occurring within the last 10 years: NO If all of the above answers are "NO", then may proceed with Cephalosporin use.    Patient Measurements: Height: 4\' 11"  (149.9 cm) Weight: 148 lb 9.4 oz (67.4 kg) IBW/kg (Calculated) : 43.2 Heparin Dosing Weight: 62.6 kg  Vital Signs: Temp: 97.6 F (36.4 C) (05/31 0552) Temp Source: Oral (05/31 0552) BP: 114/56 (05/31 0552) Pulse Rate: 71 (05/31 0552)  Labs: Recent Labs    11/27/18 0345 11/28/18 0458 11/29/18 0526  HGB 8.8*  --  8.8*  HCT 29.6*  --  29.2*  PLT 171  --  171  LABPROT 28.5* 27.6* 25.1*  INR 2.7* 2.6* 2.3*  CREATININE  --   --  1.24*    Estimated Creatinine Clearance: 29.7 mL/min (A) (by C-G formula based on SCr of 1.24 mg/dL (H)).   Medical History: Past Medical History:  Diagnosis Date  . Arthritis    "back, hands, knees" (04/10/2016)  . Chronic atrial fibrillation    a. On Coumadin  . Chronic back pain   . 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 EF 25% by echo in 08/2015 b. RHC in 08/2015 showed normal filling pressures  . Chronic kidney disease   . Compression fracture    "several; all in my back" (04/10/2016)  . GERD  (gastroesophageal reflux disease)   . Hypertension   . Lymphedema   . Pacemaker 05/2017  . S/P MVR (mitral valve replacement)    a. MVR 1994 b. redo MVR in 08/2014 - on Coumadin  . Shortness of breath dyspnea      Assessment: Pharmacy was consulted to start heparin bridge with warfarin. Pt was on warfarin previously for atrial fibrillation with mechanical mitral valve. Per coumadin clinic notes, patient's INR goal is 2.5 - 3.5 PTA. Her home evening dose of warfarin is: 6 mg MWF and 3 mg TThSaSun. On admission patient's INR was 3.5 and patient had a hematoma in her leg. Pt went for I&D on 5/13. Vitamin K was given 5/12 and INR has came down to 1.2 for procedure.  Drug interactions: Amiodarone which can increase INR.   Date INR Warfarin Dose  5/11 3.2 Held  5/12 2.8 Held and gave Vitamin K IV  5/13 1.2 6 mg  5/14 1.2 6 mg  5/15 1.3 6 mg  5/16 1.6 6 mg   5/17 1.9 --  5/18 2.3 -- Not given due to pt being lethargic  5/19 2.0 6 mg   5/20 2.4 No dose ordered/charted?  5/21 2.1  5mg   5/22 2.2  4 mg  5/23 2.2  5 mg  5/24 2.1 6 mg  5/25 2.4 6mg   5/26 2.6 5mg   5/27  3.1 Hold dose  5/28 3.4 Hold dose  5/29 2.7 3mg   5/30 2.6 3mg   5/31 2.3    Cardiology recommended goal INR around 2.5 given her increased risk of bleeding  Goal of Therapy:  INR Goal 2-3, given recent bleed (per cardiology, even with valve). Monitor platelets by anticoagulation protocol: Yes   Plan:   Home regimen is as stated above with a total weekly dose of 30mg .  INR 2.3 - Major DDI with amiodarone (home med) AND DDI w/Cipro (added 5/21 - now complete)  Will order Warfarin 4mg  x 1 dose tonight.    Pharmacy will continue to monitor and adjust per consult.   Pernell Dupre, PharmD, BCPS Clinical Pharmacist 11/29/2018 6:17 AM

## 2018-11-29 NOTE — Progress Notes (Signed)
Dressing change to left lower extremity completed, daughter cindy at bedside, tolerated well. No concerns at this time.

## 2018-11-29 NOTE — Significant Event (Signed)
A [place] family meeting was held on May 27 to discuss concerns regarding patient care: The following people attended the family meeting: Posey Pronto, Dept. Director Adrian Prince, Assistant director Marylen Ponto executive Director Geoffry Paradise, Case Manager Aliene Altes, Case Manager Dr. Posey Pronto, Hospitalist and the [place] children, [name] and Madie Reno. The meeting was held to discuss Mrs. [facility] discharge plan and the family concerns from her prior discharge.  Marylen Ponto lead the meeting and stated to the family that we will honor Mrs. Clinkscale wishes and the family stated that they will agree to disagree on that because Mrs. Coia needs  will have to outweigh her  wants  when it comes to what she needs for discharge. The following concerns were:  Marland Kitchen They stated that Dr. Rockey Situ told them that their mother would not be discharged before Monday, May 11th and on Sunday, May 10 they received a call from Dr. Jerelyn Charles stating that their mom was ready for discharge.  . I felt like the physician was rude and unprofessional . The family stated that they felt their mom dressing was a tourniquet and not placed on properly. her daughter. [name] and [place] shared pictures of their mom dressing upon her discharged to home. . They were disturbed about the fact that there were no discharge instructions about the dressings on their mom's leg. . On Monday May 12th, the A.D. went over the discharge instructions with [name] and she shared what Richardson Landry went over with her.  [name] said nothing about the dressing, nor did she inquire about it. [name] stated that  she was also concern that the cardiologist was not immediately consulted when her mom was readmitted May 11th  she stated that a nurse told her that "your mom is here for a vascular issue right now not a cardiac issue" but she did not want to get anyone in trouble.  Helene Kelp the son stated that he believes if the dressing had been changed by the person who  discharged mom would have seen the hematoma before she was discharged. He felt like gross negligence occurred here.   Follow up on this opportunity during admissions. 1. Make sure the wound is changed daily now  2. Making sure Mrs. Arca is receiving optimal nutrition and contains work with PT/OT and have increased activity (OOB) while here and stay here until she is ready for rehab.  3. Patient family is requesting [facility] or [facility] for placement (preference is to stay at [facility]). 4. The Case Manager was also going to do additional SNF follow up and encouraged the family to check out other areas in [facility].  5. The family wants to meet with Leadership again on Monday June 1st to discuss findings  6. The family requested for mom to have compression hose from home be placed on right leg to help prevent new Hematomas  Follow-up from the meeting on May 27th:  . Dr. Posey Pronto Dr. Posey Pronto stated that the patient can wear a compression hose if the plastic physicians is OK with that treatment plan . Jennifer Case Manager called [facility] and pleaded for another opportunity for placement after [facility] was denied.  Follow-up Meeting on May 28th . Transition of care received and received approval for Mrs. Scrogham to go to [facility] . Mrs. Mcglynn and her daughter wanted to leave on Monday instead on Thursday or Friday. . Dr. Posey Pronto and Posey Pronto met with the patient and the daughter to discuss patient  readiness for discharge the patient and daughter wanted to stay until Monday because the daughter felt like she was not ready to send her mother to the skill facility. .  She had to get her an IPAD because they will not be allowed to visit their mom and the patient was very worried about nursing understanding in knowing how to do her dressing.  .  Dr. Posey Pronto stated to the family that the patient is ready for discharge from a medical standpoint and Dr. Tollie Pizza was willing to come out to assist nursing at [facility] with the dressing. . Dr. Posey Pronto would recommend for the patient to have the [place] off during the day and only used on nights and the patient will be expected to get OOB for dinner and lunch to enhance her activity. Dr. Posey Pronto also stated to the family and patient that by delaying her going to rehab, she is delaying recovery.  .  Dept. Director wrote up a contract for the patient in place at the bedside with the following expectations until Monday June 1st. Plan of Care Contract Written: . Purwick is only to be used at night at 6 PM . Patient is to get out for lunch and dinner and with therapy and cannot refuse treatment

## 2018-11-29 NOTE — Progress Notes (Signed)
[facility] [place] NAME: [name]    MR#:  [id number]  DATE OF BIRTH:  1935-03-22  SUBJECTIVE:  CHIEF COMPLAINT:   Chief Complaint  Patient presents with  . Leg Swelling   -Feeling comfortable on 4 L oxygen.  Oxygen level desaturates with movement and that has been going on since last week.  Discussed with daughter, no plans for chest x-ray at this time  REVIEW OF SYSTEMS:  Review of Systems  Constitutional: Positive for malaise/fatigue. Negative for chills and fever.  HENT: Negative for congestion, ear discharge, hearing loss and nosebleeds.   Eyes: Negative for blurred vision and double vision.  Respiratory: Positive for shortness of breath. Negative for cough and wheezing.   Cardiovascular: Positive for leg swelling. Negative for chest pain and palpitations.  Gastrointestinal: Negative for abdominal pain, constipation, diarrhea, nausea and vomiting.  Genitourinary: Negative for dysuria.  Musculoskeletal: Negative for myalgias.  Neurological: Negative for dizziness, seizures and headaches.    DRUG ALLERGIES:   Allergies  Allergen Reactions  . Ace Inhibitors     Cough  . Doxycycline Nausea Only  . Hydrocodone Itching and Nausea Only  . Mercury     Other reaction(s): Unknown  . Silver Dermatitis    Severe itching  . Cephalexin Rash  . Clindamycin/Lincomycin Rash  . Nickel Rash  . Penicillins Rash    Has patient had a PCN reaction causing immediate rash, facial/tongue/throat swelling, SOB or lightheadedness with hypotension: YES Has patient had a PCN reaction causing severe rash involving mucus membranes or skin necrosis: NO Has patient had a PCN reaction that required hospitalization NO Has patient had a PCN reaction occurring within the last 10 years: NO If all of the above answers are "NO", then may proceed with Cephalosporin use.    VITALS:  Blood pressure (!) 108/50, pulse 63, temperature 97.8 F (36.6  C), temperature source Oral, resp. rate 18, height 4\' 11"  (1.499 m), weight 66.8 kg, SpO2 96 %.  PHYSICAL EXAMINATION:  Physical Exam   GENERAL:  84 y.o.-year-old elderly patient lying in the bed with no acute distress.  EYES: Pupils equal, round, reactive to light and accommodation. No scleral icterus. Extraocular muscles intact.  HEENT: Head atraumatic, normocephalic. Oropharynx and nasopharynx clear.  NECK:  Supple, no jugular venous distention. No thyroid enlargement, no tenderness.  LUNGS: Normal breath sounds bilaterally, no wheezing, rales,rhonchi or crepitation. No use of accessory muscles of respiration.  Decreased breath sounds significantly at the base CARDIOVASCULAR: S1, S2 normal. No  rubs, or gallops.  3/6 systolic murmur is present ABDOMEN: Soft, nontender, nondistended. Bowel sounds present. No organomegaly or mass.  EXTREMITIES: No  cyanosis, or clubbing.  Right leg 1+ ankle edema and leg edema with prior hematomas which are improving.  Left leg is wrapped due to prior hematoma and graft NEUROLOGIC: Cranial nerves II through XII are intact. Muscle strength 5/5 in all extremities. Sensation intact. Gait not checked.  Generalized weakness present PSYCHIATRIC: The patient is alert and oriented x 3.  SKIN: No obvious rash, lesion, or ulcer.    LABORATORY PANEL:   CBC Recent Labs  Lab 11/29/18 0526  WBC 4.1  HGB 8.8*  HCT 29.2*  PLT 171   ------------------------------------------------------------------------------------------------------------------  Chemistries  Recent Labs  Lab 11/29/18 0526  NA 141  K 3.5  CL 97*  CO2 36*  GLUCOSE 95  BUN 24*  CREATININE 1.24*  CALCIUM 9.1   ------------------------------------------------------------------------------------------------------------------  Cardiac Enzymes No  results for input(s): TROPONINI in the last 168 hours.  ------------------------------------------------------------------------------------------------------------------  RADIOLOGY:  No results found.  EKG:   Orders placed or performed during the hospital encounter of 11/04/18  . EKG 12-Lead  . EKG 12-Lead    ASSESSMENT AND PLAN:   84 year old female with past medical history significant for mitral valve replacement with a mechanical valve on chronic Coumadin, atrial fibrillation/flutter status post pacemaker, hypertension, chronic systolic heart failure, chronic bilateral lymphedema, GERD and hypertension, pulmonary hypertension on chronic 2 L home oxygen brought from home secondary to left lower extremity hematoma.  # Left lower extremity hematoma-after a trauma while on Coumadin. -Appreciate surgical consult. Patient had debridement of the left calf hematoma and a biological graft placed in on 11/11/2018..  -Dr.Byrnett discussed with [name], DO from plastics. Patient is s/p Acell placement on 11/19/2018 -RN to follow dressing change per Dr Elenor Quinones recommendation. Please change dressing every  day starting 11/24/18 with KY gel to the adaptic and then wrap the leg with kerlex and ABD and Ace wrap - can call [phone] with any questions.  Dr. Marla Roe -Wound culture with Enterobacter, few basilllus species--completed 10 days of po cipro per sensitivity -Seen by Dr Trula Slade from vascular per dter's request -there is no further surgery scheduled per plastic surgeon Dr. [name]/dr byrnett.  -pt will f/u Dr Bary Castilla as out pt (Surgery will stop by at TL) -Continue dressing changes as recommended by surgery  #Acute blood loss anemia-secondary to bleeding into her hematoma -Received 3 units packed RBC transfusion this admission.   -Last hemoglobin stable at 8.8.  Continue to monitor  #Acute on chronic systolic heart failure exacerbation- EF 20-25% -Remains on chronic 3 L oxygen. - Patient has a pacemaker, atrial  flutter/fibrillation status post DC cardioversion recently - cont lasix 40 mg p.o. daily.    Patient wants torsemide to be changed to Lasix. -No ACE inhibitor due to underlying CKD and relative hypotension per Cardiology.   -Last echo with EF of 25% -Pt does desat into the 80's with exertion and recovers after few mins during PT-continue incentive spirometer.  Patient refused a chest x-ray.  Daughter agrees with that as most leads positional/postural and needs deep breathing techniques  Oxygen requirements go up temporarily but she settles down there after.  4. History of mechanical mitral valve surgery-Coumadin has been restarted - Coumadin and monitor PT/INR -pharmacy managing it, INR is therapeutic  5.CKD stage IV Renal function remains fairly stable at this time with creatinine of 1.7-1.4-1.36  6.  Generalized weakness physical therapy recommending skilled nursing facility -Patient has significant osteoporosis and can only participate with therapy for walking and nonweight lifting exercises  7. Bilateral lower extremity chronic lymphedema -family (cindy and [place]) mentioned patient has Ted hose which they have brought in from home and would like patient to use it.  However patient refused the TED hose when asked by Dr. Posey Pronto on Friday. -Continue Lasix  -keep leg elevated when in bed   [name] purwick will be removed during the daytime. She can have it at nighttime only. Daughter agrees with it  Daughter [name] updated at bedside today   All the records are reviewed and case discussed with Care Management/Social Workerr. Management plans discussed with the patient, family and they are in agreement.  CODE STATUS: Full code  TOTAL TIME TAKING CARE OF THIS PATIENT: 37 minutes.   POSSIBLE D/C IN 1-2 DAYS, DEPENDING ON CLINICAL CONDITION.   Gladstone Lighter M.D on 11/29/2018 at 11:17 AM  Between 7am to 6pm - Pager - [phone]  After 6pm go to [email or web address] - password  [facility]  Office  [phone]  CC: Primary care physician; Rusty Aus, MD

## 2018-11-29 NOTE — Progress Notes (Signed)
RN called to room patient was up to [facility] with help of daughter [name] without her oxygen on, oxygen sats in 60's, nasal cannula applied and patient coached on pursed lip breathing and reassurred. Patient sats back to 93% on 4 L [place]. Educated daughter and patient on importance of leaving [place] in place and calling for assistance.

## 2018-11-30 LAB — PROTIME-INR
INR: 2.3 — ABNORMAL HIGH (ref 0.8–1.2)
Prothrombin Time: 25.1 seconds — ABNORMAL HIGH (ref 11.4–15.2)

## 2018-11-30 MED ORDER — ALPRAZOLAM 0.25 MG PO TABS: 0.2500 mg | ORAL_TABLET | Freq: Every evening | ORAL | 0 refills | Status: DC | PRN

## 2018-11-30 MED ORDER — WARFARIN SODIUM 4 MG PO TABS
4.0000 mg | ORAL_TABLET | Freq: Once | ORAL | Status: DC
  Filled 2018-11-30: qty 1

## 2018-11-30 MED ORDER — OXYCODONE HCL 5 MG PO TABS: 2.5000 mg | ORAL_TABLET | Freq: Four times a day (QID) | ORAL | 0 refills | Status: DC | PRN

## 2018-11-30 MED ORDER — FUROSEMIDE 40 MG PO TABS: 40.0000 mg | ORAL_TABLET | Freq: Two times a day (BID) | ORAL | 1 refills | Status: DC

## 2018-11-30 MED ORDER — ZINC SULFATE 220 (50 ZN) MG PO CAPS: 220.0000 mg | ORAL_CAPSULE | Freq: Every day | ORAL | 1 refills | Status: DC

## 2018-11-30 MED ORDER — POTASSIUM CHLORIDE CRYS ER 20 MEQ PO TBCR: 20.0000 meq | EXTENDED_RELEASE_TABLET | Freq: Every day | ORAL | 0 refills | Status: DC

## 2018-11-30 MED ORDER — ALBUTEROL SULFATE (2.5 MG/3ML) 0.083% IN NEBU: 2.5000 mg | INHALATION_SOLUTION | Freq: Four times a day (QID) | RESPIRATORY_TRACT | 12 refills | Status: DC | PRN

## 2018-11-30 MED ORDER — ACETAMINOPHEN ER 650 MG PO TBCR: 650.0000 mg | EXTENDED_RELEASE_TABLET | Freq: Four times a day (QID) | ORAL | Status: DC | PRN

## 2018-11-30 MED ORDER — WARFARIN SODIUM 4 MG PO TABS: 4.0000 mg | ORAL_TABLET | Freq: Once | ORAL | 1 refills | Status: DC

## 2018-11-30 MED ORDER — POLYETHYLENE GLYCOL 3350 17 G PO PACK: 17.0000 g | PACK | Freq: Every day | ORAL | 0 refills | Status: DC

## 2018-11-30 NOTE — TOC Transition Note (Signed)
Transition of Care [facility]) - CM/SW Discharge Note   Patient Details  Name: [name] MRN: [id number] Date of Birth: December 25, 1934  [facility] of [facility] CM/SW Contact:  Elza Rafter, RN Phone Number: 11/30/2018, 10:17 AM   Clinical Narrative:   Patient will be discharging to [facility] today via EMS.  Patients room number will be 315; RN to call report to [phone].  [place] placed on chart and RN aware.      Final next level of care: [place] Barriers to Discharge: Continued Medical Work up   Patient Goals and CMS Choice Patient states their goals for this hospitalization and ongoing recovery are:: go home with home health CMS Medicare.gov Compare Post Acute Care list provided to:: Patient Choice offered to / list presented to : Patient  Discharge Placement                       Discharge Plan and Services   Discharge Planning Services: CM Consult Post Acute Care Choice: Home Health                    HH Arranged: RN, PT [facility] Agency: [place] ([facility]) Date [facility] Agency Contacted: 11/09/18 Time [place]: [place] Representative spoke with at [place]: [place] ([place]) Interventions     Readmission Risk Interventions Readmission Risk Prevention Plan 11/30/2018 11/09/2018  Transportation Screening Complete Complete  PCP or Specialist Appt within 3-5 Days - Complete  HRI or [place] - Complete  Social Work Consult for [place] Planning/Counseling - Not Complete  SW consult not completed comments - na  Palliative Care Screening - Not Applicable  Medication Review [employer]) Complete Complete  PCP or Specialist appointment within 3-5 days of discharge Complete -  [place] or Home Care Consult Complete -  SW Recovery Care/Counseling Consult Not Complete -  SW Consult Not Complete Comments na -  Palliative Care Screening Not Applicable -  Skilled Nursing Facility  Complete -  Some recent data might be hidden

## 2018-11-30 NOTE — Discharge Summary (Addendum)
[facility] NAME: [name]    MR#:  [id number]  DATE OF BIRTH:  03/14/1935  DATE OF ADMISSION:  11/09/2018   ADMITTING PHYSICIAN: Christel Mormon, MD  DATE OF DISCHARGE:  11/30/18  PRIMARY CARE PHYSICIAN: Rusty Aus, MD   ADMISSION DIAGNOSIS:   Hematoma [T14.8XXA] Hypotension, unspecified hypotension type [I95.9] Anemia, unspecified type [D64.9]  DISCHARGE DIAGNOSIS:   Active Problems:   History of mitral valve replacement with mechanical valve   Acute on chronic HFrEF (heart failure with reduced ejection fraction) (HCC)   Hematoma   Persistent atrial fibrillation   Atrial tachycardia (HCC)   Chronic kidney disease (CKD), stage IV (severe) (HCC)   Chest pain of uncertain etiology   SECONDARY DIAGNOSIS:   Past Medical History:  Diagnosis Date   Arthritis    "back, hands, knees" (04/10/2016)   Chronic atrial fibrillation    a. On Coumadin   Chronic back pain    Chronic combined systolic (congestive) and diastolic (congestive) heart failure (HCC)    a. EF 25% by echo in 08/2015 b. RHC in 08/2015 showed normal filling pressures   Chronic kidney disease    Compression fracture    "several; all in my back" (04/10/2016)   GERD (gastroesophageal reflux disease)    Hypertension    Lymphedema    Pacemaker 05/2017   S/P MVR (mitral valve replacement)    a. MVR 1994 b. redo MVR in 08/2014 - on Coumadin   Shortness of breath dyspnea     HOSPITAL COURSE:   84 year old female with past medical history significant for mitral valve replacement with a mechanical valve on chronic Coumadin, atrial fibrillation/flutter status post pacemaker, hypertension, chronic systolic heart failure,chronic bilaterallymphedema, GERD and hypertension, pulmonary hypertension on chronic 2 L home oxygen brought from home secondary to left lower extremity hematoma.  # Left lower extremity hematoma-after a trauma while on  Coumadin. -Appreciate surgical consult. Patient had debridement of the left calf hematoma and a biological graft placed in on 11/11/2018.. -Dr.Byrnett discussed with Carrie Sabal, DO from plastics. Patient is s/p Acell placement on 11/19/2018 -RN to follow dressing change per Dr Elenor Quinones recommendation.Please change dressing every day starting 11/24/18 with KY gel to the adaptic and then wrap the leg with kerlex and ABD and Ace wrap - can call [phone] with any questions. Dr. Marla Roe -Wound culture with Enterobacter, few basilllus species--completed 10 days ofpo cipro per sensitivity -Seen by Dr Trula Slade from vascular per dter's request -there isnofurther surgery scheduled per plastic surgeon Dr. [name]/dr byrnett.  -pt will f/u Dr byrnett and Dr. Marla Roe in 2 days - appt scheduled -Continue dressing changes as recommended by surgery  #Acute blood loss anemia-secondary to bleeding into her hematoma -Received3units packed RBC transfusion this admission.  -Last hemoglobin stable at 8.8.  Continue to monitor  #Acute on chronic systolic heart failure exacerbation- EF 20-25% -Remains on chronic 4 L oxygen. - Patient has a pacemaker, atrial flutter/fibrillation status post DC cardioversion recently - cont lasix 40 mg -changed to 40 mg twice daily per cardiology recommendations at discharge.  Discussed with daughter at bedside. -.   Patient wants torsemide to be changed to Lasix. -No ARB at this time due relative hypotension per Cardiology.    Once her blood pressure improves, plan is to restart her low-dose losartan, Coreg and Aldactone as outpatient -Last echo with EF of 25% from May 2020 -Pt does desat into the 80's with exertion and  recovers after few mins during PT-continue incentive spirometer.  Patient refused a chest x-ray 1 day prior to discharge.  Daughter agrees with that as most leads positional/postural and needs deep breathing techniques Oxygen  requirements go up temporarily but she settles down there after. -She sometimes requires up to 6 L of oxygen with physical therapy -Encourage her to use incentive spirometer  4. History of mechanical mitral valve surgery-Coumadin has been restarted - Coumadin and monitor PT/INR -pharmacy managing it, INR is therapeutic -Goal is to keep the INR around 2.5-3.0 given recent hematoma  5.CKD stage IV Renal function remains fairly stable at this time with creatinine of 1.7-1.4-1.36  6. Generalized weakness physical therapy recommending skilled nursing facility -Patient has significant osteoporosis and can only participate with therapy for walking and nonweight lifting exercises  7. Bilateral lower extremity chronic lymphedema -family mentioned patient has Ted hose which they have brought in from home and would like patient to use it.  However patient refused the TED hose when asked by Dr. Posey Pronto on Friday 11/27/18. -Continue Lasix bid -keep leg elevated when in bed   Daughter [name] updated at bedside today.  Discussed with Dr. Bary Castilla from surgery this morning and also discussed with cardiology, [facility] today Answered all questions from daughter to the best of my knowledge and facilitated care.  Patient will be discharged to [facility] rehab today   DISCHARGE CONDITIONS:   Extremely Guarded  CONSULTS OBTAINED:   Treatment Team:  Robert Bellow, MD  DRUG ALLERGIES:   Allergies  Allergen Reactions   Ace Inhibitors     Cough   Doxycycline Nausea Only   Hydrocodone Itching and Nausea Only   Mercury     Other reaction(s): Unknown   Silver Dermatitis    Severe itching   Cephalexin Rash   Clindamycin/Lincomycin Rash   Nickel Rash   Penicillins Rash    Has patient had a PCN reaction causing immediate rash, facial/tongue/throat swelling, SOB or lightheadedness with hypotension: YES Has patient had a PCN reaction causing severe rash involving mucus membranes or  skin necrosis: NO Has patient had a PCN reaction that required hospitalization NO Has patient had a PCN reaction occurring within the last 10 years: NO If all of the above answers are "NO", then may proceed with Cephalosporin use.   DISCHARGE MEDICATIONS:   Allergies as of 11/30/2018      Reactions   Ace Inhibitors    Cough   Doxycycline Nausea Only   Hydrocodone Itching, Nausea Only   Mercury    Other reaction(s): Unknown   Silver Dermatitis   Severe itching   Cephalexin Rash   Clindamycin/lincomycin Rash   Nickel Rash   Penicillins Rash   Has patient had a PCN reaction causing immediate rash, facial/tongue/throat swelling, SOB or lightheadedness with hypotension: YES Has patient had a PCN reaction causing severe rash involving mucus membranes or skin necrosis: NO Has patient had a PCN reaction that required hospitalization NO Has patient had a PCN reaction occurring within the last 10 years: NO If all of the above answers are "NO", then may proceed with Cephalosporin use.      Medication List    STOP taking these medications   aspirin EC 81 MG tablet   carvedilol 3.125 MG tablet Commonly known as:  COREG   Klor-Con 10 10 MEQ tablet Generic drug:  potassium chloride   losartan 25 MG tablet Commonly known as:  COZAAR   metolazone 5 MG tablet Commonly known as:  ZAROXOLYN   spironolactone 25 MG tablet Commonly known as:  ALDACTONE     TAKE these medications   acetaminophen 650 MG CR tablet Commonly known as:  TYLENOL Take 1 tablet (650 mg total) by mouth every 6 (six) hours as needed for pain (mild and moderate pain). What changed:    when to take this  reasons to take this   albuterol (2.5 MG/3ML) 0.083% nebulizer solution Commonly known as:  PROVENTIL Take 3 mLs (2.5 mg total) by nebulization every 6 (six) hours as needed for wheezing or shortness of breath.   ALPRAZolam 0.25 MG tablet Commonly known as:  XANAX Take 1 tablet (0.25 mg total) by mouth at  bedtime as needed for sleep.   amiodarone 200 MG tablet Commonly known as:  PACERONE Take 1 tablet (200 mg total) by mouth daily.   furosemide 40 MG tablet Commonly known as:  LASIX Take 1 tablet (40 mg total) by mouth 2 (two) times daily. What changed:    medication strength  See the new instructions.   Icy Hot 5 % Ptch Generic drug:  Menthol Apply 1 patch topically daily as needed (pain).   oxyCODONE 5 MG immediate release tablet Commonly known as:  Oxy IR/ROXICODONE Take 0.5 tablets (2.5 mg total) by mouth every 6 (six) hours as needed for severe pain.   polyethylene glycol 17 g packet Commonly known as:  MIRALAX / GLYCOLAX Take 17 g by mouth daily.   potassium chloride SA 20 MEQ tablet Commonly known as:  K-DUR Take 1 tablet (20 mEq total) by mouth daily.   RISA-BID PROBIOTIC PO Take 1 capsule by mouth daily.   vitamin C 500 MG tablet Commonly known as:  ASCORBIC ACID Take 500 mg by mouth daily.   Vitamin D 50 MCG (2000 UT) tablet Take 2,000 Units by mouth daily.   warfarin 4 MG tablet Commonly known as:  COUMADIN Take as directed. If you are unsure how to take this medication, talk to your nurse or doctor. Original instructions:  Take 1 tablet (4 mg total) by mouth one time only at 6 PM. What changed:    medication strength  how much to take  when to take this  additional instructions   zinc sulfate 220 (50 Zn) MG capsule Take 1 capsule (220 mg total) by mouth daily. Start taking on:  December 01, 2018        DISCHARGE INSTRUCTIONS:   1.  Patient has significant osteoporosis, please limit weight lifting exercises.  Can do walking with physical therapy. 2.  Monitor oxygen saturations while working with physical therapy and the goal is to keep it at 89% and above. 3.  Follow-up with Dr. Bary Castilla, surgery in 3 days. 4.  Cardiology follow-up in 1 week 5.  PCP follow-up in 1 to 2 weeks. 6. PLEASE CHECK INR every other day and also BMP in 4 days    DIET:   Cardiac diet  ACTIVITY:   Activity as tolerated  OXYGEN:   Home Oxygen: Yes.    Oxygen Delivery: 4 liters/min via Patient connected to nasal cannula oxygen  DISCHARGE LOCATION:   nursing home   If you experience worsening of your admission symptoms, develop shortness of breath, life threatening emergency, suicidal or homicidal thoughts you must seek medical attention immediately by calling 911 or calling your MD immediately  if symptoms less severe.  You Must read complete instructions/literature along with all the possible adverse reactions/side effects for all the Medicines you take and  that have been prescribed to you. Take any new Medicines after you have completely understood and accpet all the possible adverse reactions/side effects.   Please note  You were cared for by a hospitalist during your hospital stay. If you have any questions about your discharge medications or the care you received while you were in the hospital after you are discharged, you can call the unit and asked to speak with the hospitalist on call if the hospitalist that took care of you is not available. Once you are discharged, your primary care physician will handle any further medical issues. Please note that NO REFILLS for any discharge medications will be authorized once you are discharged, as it is imperative that you return to your primary care physician (or establish a relationship with a primary care physician if you do not have one) for your aftercare needs so that they can reassess your need for medications and monitor your lab values.    On the day of Discharge:  VITAL SIGNS:   Blood pressure (!) 106/44, pulse (!) 59, temperature 97.8 F (36.6 C), temperature source Oral, resp. rate 18, height 4\' 11"  (1.499 m), weight 66.8 kg, SpO2 91 %.  PHYSICAL EXAMINATION:   GENERAL:  84 y.o.-year-old elderly patient sitting in the chair. with no acute distress. Her posture is drooped EYES:  Pupils equal, round, reactive to light and accommodation. No scleral icterus. Extraocular muscles intact.  HEENT: Head atraumatic, normocephalic. Oropharynx and nasopharynx clear.  NECK:  Supple, no jugular venous distention. No thyroid enlargement, no tenderness.  LUNGS: good breath sounds bilaterally in the upper and middle lobes, no wheezing, rales,rhonchi or crepitation. No use of accessory muscles of respiration.  Decreased breath sounds significantly at the bases CARDIOVASCULAR: S1, S2 normal. No  rubs, or gallops.  3/6 systolic murmur is present ABDOMEN: Soft, nontender, nondistended. Bowel sounds present. No organomegaly or mass.  EXTREMITIES: No  cyanosis, or clubbing.  Right leg 1+ ankle edema and leg with prior small  hematomas which are improving. marking in place.   Left leg is wrapped due to prior hematoma and graft NEUROLOGIC: Cranial nerves II through XII are intact. Muscle strength 5/5 in all extremities. Sensation intact. Gait not checked.  Generalized weakness present PSYCHIATRIC: The patient is alert and oriented x 3.  SKIN: No obvious rash, lesion, or ulcer.   DATA REVIEW:   CBC Recent Labs  Lab 11/29/18 0526  WBC 4.1  HGB 8.8*  HCT 29.2*  PLT 171    Chemistries  Recent Labs  Lab 11/29/18 0526  NA 141  K 3.5  CL 97*  CO2 36*  GLUCOSE 95  BUN 24*  CREATININE 1.24*  CALCIUM 9.1     Microbiology Results  Results for orders placed or performed during the hospital encounter of 11/09/18  Urine Culture     Status: None   Collection Time: 11/09/18  4:07 PM  Result Value Ref Range Status   Specimen Description   Final    URINE, RANDOM Performed at [facility], [address], [place], [place] [address]    Special Requests   Final    NONE Performed at [facility], [address] [place], [place] [address]    Culture   Final    NO GROWTH Performed at [facility], [place] [address] [place],  [address]    Report Status  11/10/2018 FINAL  Final  Aerobic Culture (superficial specimen)     Status: None  Collection Time: 11/13/18  1:18 PM  Result Value Ref Range Status   Specimen Description   Final    WOUND LEFT LATERAL CALF Performed at [facility], [address], [place], [place] [address]    Special Requests   Final    NONE Performed at [facility], [address] [place] [place], [place] [address]    Gram Stain   Final    FEW WBC PRESENT, PREDOMINANTLY PMN ABUNDANT GRAM NEGATIVE RODS RARE GRAM POSITIVE RODS    Culture   Final    MODERATE ENTEROBACTER CLOACAE FEW BACILLUS SPECIES Standardized susceptibility testing for this organism is not available. Performed at [facility], [place] [address] [place], [place] [address]    Report Status 11/16/2018 FINAL  Final   Organism ID, Bacteria ENTEROBACTER CLOACAE  Final      Susceptibility   Enterobacter cloacae - MIC*    CEFAZOLIN >=64 RESISTANT Resistant     CEFEPIME <=1 SENSITIVE Sensitive     CEFTAZIDIME <=1 SENSITIVE Sensitive     CEFTRIAXONE <=1 SENSITIVE Sensitive     CIPROFLOXACIN <=0.25 SENSITIVE Sensitive     GENTAMICIN <=1 SENSITIVE Sensitive     IMIPENEM <=0.25 SENSITIVE Sensitive     TRIMETH/SULFA <=20 SENSITIVE Sensitive     PIP/TAZO <=4 SENSITIVE Sensitive     * MODERATE ENTEROBACTER CLOACAE  SARS Coronavirus 2 [facility] order, Performed in [place] hospital lab)     Status: None   Collection Time: 11/25/18  4:16 PM  Result Value Ref Range Status   SARS Coronavirus 2 NEGATIVE NEGATIVE Final    Comment: (NOTE) If result is NEGATIVE SARS-CoV-2 target nucleic acids are NOT DETECTED. The SARS-CoV-2 RNA is generally detectable in upper and lower  respiratory specimens during the acute phase of infection. The lowest  concentration of SARS-CoV-2 viral copies this assay can detect is 250  copies / mL. A negative result does not preclude SARS-CoV-2 infection  and should not be used as the sole basis for  treatment or other  patient management decisions.  A negative result may occur with  improper specimen collection / handling, submission of specimen other  than nasopharyngeal swab, presence of viral mutation(s) within the  areas targeted by this assay, and inadequate number of viral copies  (<250 copies / mL). A negative result must be combined with clinical  observations, patient history, and epidemiological information. If result is POSITIVE SARS-CoV-2 target nucleic acids are DETECTED. The SARS-CoV-2 RNA is generally detectable in upper and lower  respiratory specimens dur ing the acute phase of infection.  Positive  results are indicative of active infection with SARS-CoV-2.  Clinical  correlation with patient history and other diagnostic information is  necessary to determine patient infection status.  Positive results do  not rule out bacterial infection or co-infection with other viruses. If result is PRESUMPTIVE POSTIVE SARS-CoV-2 nucleic acids MAY BE PRESENT.   A presumptive positive result was obtained on the submitted specimen  and confirmed on repeat testing.  While 2019 novel coronavirus  (SARS-CoV-2) nucleic acids may be present in the submitted sample  additional confirmatory testing may be necessary for epidemiological  and / or clinical management purposes  to differentiate between  SARS-CoV-2 and other Sarbecovirus currently known to infect humans.  If clinically indicated additional testing with an alternate test  methodology [phone]) is advised. The SARS-CoV-2 RNA is generally  detectable in upper and lower respiratory sp ecimens during the acute  phase of infection. The expected  result is Negative. Fact Sheet for Patients:  StrictlyIdeas.no Fact Sheet for Healthcare Providers: BankingDealers.co.za This test is not yet approved or cleared by the [place] FDA and has been authorized for detection and/or  diagnosis of SARS-CoV-2 by FDA under an Emergency Use Authorization (EUA).  This EUA will remain in effect (meaning this test can be used) for the duration of the COVID-19 declaration under Section 564(b)(1) of the Act, 21 U.S.C. section 360bbb-3(b)(1), unless the authorization is terminated or revoked sooner. Performed at [facility], [address], [place], [place] [address]     RADIOLOGY:  No results found.   Management plans discussed with the patient, family and they are in agreement.  CODE STATUS:     Code Status Orders  (From admission, onward)         Start     Ordered   11/24/18 1622  Full code  Continuous     11/24/18 1621        Code Status History    Date Active Date Inactive Code Status Order ID Comments User Context   11/24/2018 1557 11/24/2018 1620 DNR [id number]  Fritzi Mandes, MD Inpatient   11/09/2018 0333 11/24/2018 1557 Full Code [id number]  Mayer Camel, NP ED   11/04/2018 1650 11/08/2018 2006 Full Code [id number]  Saundra Shelling, MD Inpatient   04/10/2016 2034 04/19/2016 1757 Full Code [id number]  Vianne Bulls, MD Inpatient   09/29/2015 1053 10/10/2015 1406 Full Code [id number]  [name], MD Inpatient   04/28/2015 1626 04/30/2015 1422 Full Code [id number]  Demetrios Loll, MD Inpatient      TOTAL TIME TAKING CARE OF THIS PATIENT: 45  minutes.    Gladstone Lighter M.D on 11/30/2018 at 9:57 AM  Between 7am to 6pm - Pager - [phone]  After 6pm go to [email or web address] - Proofreader  [employer]  Hospitalists  Office  [phone]  CC: Primary care physician; Rusty Aus, MD   Note: This dictation was prepared with Dragon dictation along with smaller phrase technology. Any transcriptional errors that result from this process are unintentional.

## 2018-11-30 NOTE — Progress Notes (Signed)
[place] for  Warfarin  Indication: atrial fibrillation, Mechanical Mitral Valve  Allergies  Allergen Reactions  . Ace Inhibitors     Cough  . Doxycycline Nausea Only  . Hydrocodone Itching and Nausea Only  . Mercury     Other reaction(s): Unknown  . Silver Dermatitis    Severe itching  . Cephalexin Rash  . Clindamycin/Lincomycin Rash  . Nickel Rash  . Penicillins Rash    Has patient had a PCN reaction causing immediate rash, facial/tongue/throat swelling, SOB or lightheadedness with hypotension: YES Has patient had a PCN reaction causing severe rash involving mucus membranes or skin necrosis: NO Has patient had a PCN reaction that required hospitalization NO Has patient had a PCN reaction occurring within the last 10 years: NO If all of the above answers are "NO", then may proceed with Cephalosporin use.    Patient Measurements: Height: 4\' 11"  (149.9 cm) Weight: 147 lb 5 oz (66.8 kg) IBW/kg (Calculated) : 43.2 Heparin Dosing Weight: 62.6 kg  Vital Signs: Temp: 97.8 F (36.6 C) (06/01 0342) Temp Source: Oral (06/01 0342) BP: 106/44 (06/01 0342) Pulse Rate: 59 (06/01 0342)  Labs: Recent Labs    11/28/18 0458 11/29/18 0526 11/30/18 0549  HGB  --  8.8*  --   HCT  --  29.2*  --   PLT  --  171  --   LABPROT 27.6* 25.1* 25.1*  INR 2.6* 2.3* 2.3*  CREATININE  --  1.24*  --     Estimated Creatinine Clearance: 29.5 mL/min (A) (by C-G formula based on SCr of 1.24 mg/dL (H)).   Medical History: Past Medical History:  Diagnosis Date  . Arthritis    "back, hands, knees" (04/10/2016)  . Chronic atrial fibrillation    a. On Coumadin  . Chronic back pain   . 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 EF 25% by echo in 08/2015 b. RHC in 08/2015 showed normal filling pressures  . Chronic kidney disease   . Compression fracture    "several; all in my back" (04/10/2016)  . GERD  (gastroesophageal reflux disease)   . Hypertension   . Lymphedema   . Pacemaker 05/2017  . S/P MVR (mitral valve replacement)    a. MVR 1994 b. redo MVR in 08/2014 - on Coumadin  . Shortness of breath dyspnea      Assessment: Pharmacy was consulted to start heparin bridge with warfarin. Pt was on warfarin previously for atrial fibrillation with mechanical mitral valve. Per coumadin clinic notes, patient's INR goal is 2.5 - 3.5 PTA. Her home evening dose of warfarin is: 6 mg MWF and 3 mg TThSaSun. On admission patient's INR was 3.5 and patient had a hematoma in her leg. Pt went for I&D on 5/13. Vitamin K was given 5/12 and INR has came down to 1.2 for procedure.  Drug interactions: Amiodarone which can increase INR.   Date INR Warfarin Dose  5/25 2.4 6mg   5/26 2.6 5mg   5/27 3.1 Hold dose  5/28 3.4 Hold dose  5/29 2.7 3mg   5/30 2.6 3mg   5/31 2.3 4 mg  6/1 2.3 4 mg   Cardiology recommended goal INR around 2.5 given her increased risk of bleeding  Goal of Therapy:  INR Goal 2-3, given recent bleed (per cardiology, even with valve). Monitor platelets by anticoagulation protocol: Yes   Plan:   Home regimen is as stated above with a total weekly  dose of 30mg .  INR 2.3 - Major DDI with amiodarone (home med) AND DDI w/Cipro (added 5/21 - now complete)  INR is within goal range. Would like INR to be around 2.5. INR currently trending down due to held doses on 5/27 and 5/28. Will order Warfarin 4mg  x 1 dose tonight. When giving high dose of warfarin the INR jumps up to greater than 3. We may need to decrease the patient's weekly dose. Perhaps, a regimen of 4 mg daily would be appropriate.    Pharmacy will continue to monitor and adjust per consult.   Oswald Hillock, PharmD, BCPS Clinical Pharmacist 11/30/2018 8:28 AM

## 2018-11-30 NOTE — Progress Notes (Signed)
OT Cancellation Note  Patient Details Name: LONDAN COPLEN MRN: [id number] DOB: 1935-06-10   Cancelled Treatment:    Reason Eval/Treat Not Completed: Other (comment). Spoke with RN. EMS en route, pt to discharge to SNF shortly. Unavailable for OT tx. Will re-attempt as appropriate should pt not discharge today.  Jeni Salles, MPH, MS, OTR/L ascom [phone] 11/30/18, 3:10 PM

## 2018-11-30 NOTE — Progress Notes (Signed)
Discharge instructions explained in detail with pts daughter, [name]/ pts son , [name] also given a copy of discharge instructions/  Family had some questions about medication changes/ Dr. Tressia Miners paged and spoke with daughter over phone/ questions answered-no further questions/ verbalized an understanding of instructions/  Pt also encouraged to wear compression stocking on RIGHT leg - pt refused- states " im not wearing them"/ LEFT leg dressing changed as ordered  prior to discharge/ tolerated well/ iv and tele removed/ report called to [facility]/ verbalized an understanding/ EMS called to transport.

## 2018-11-30 NOTE — Care Management Important Message (Signed)
Important Message  Patient Details  Name: [name] MRN: [id number] Date of Birth: 1934/09/03   Medicare Important Message Given:  Yes    [name] 11/30/2018, 11:27 AM

## 2018-11-30 NOTE — Progress Notes (Signed)
Dressing changed as ordered/ pts daughter, [name] at bedside to assist/ tolerated well

## 2018-12-01 DIAGNOSIS — I5022 Chronic systolic (congestive) heart failure: Secondary | ICD-10-CM | POA: Diagnosis not present

## 2018-12-01 DIAGNOSIS — S8012XA Contusion of left lower leg, initial encounter: Secondary | ICD-10-CM

## 2018-12-01 DIAGNOSIS — F39 Unspecified mood [affective] disorder: Secondary | ICD-10-CM | POA: Diagnosis not present

## 2018-12-01 DIAGNOSIS — J9611 Chronic respiratory failure with hypoxia: Secondary | ICD-10-CM

## 2018-12-01 DIAGNOSIS — I480 Paroxysmal atrial fibrillation: Secondary | ICD-10-CM

## 2018-12-02 ENCOUNTER — Telehealth: Payer: Self-pay | Admitting: Cardiovascular Disease

## 2018-12-02 ENCOUNTER — Other Ambulatory Visit: Payer: Self-pay

## 2018-12-02 ENCOUNTER — Encounter: Payer: Self-pay | Admitting: General Surgery

## 2018-12-02 ENCOUNTER — Ambulatory Visit (INDEPENDENT_AMBULATORY_CARE_PROVIDER_SITE_OTHER): Payer: Medicare Other | Admitting: General Surgery

## 2018-12-02 VITALS — BP 117/69 | HR 64 | Temp 97.7°F | Ht 59.0 in | Wt 146.0 lb

## 2018-12-02 DIAGNOSIS — S8012XD Contusion of left lower leg, subsequent encounter: Secondary | ICD-10-CM

## 2018-12-02 NOTE — Telephone Encounter (Signed)
TCM....  Patient is being discharged   They saw Dr End  They are scheduled to see Dr Rockey Situ on 6/10  They were seen for chronic systolic and diastolic heart failure  They need to be seen within 1 week   Pt not added to wait list   Please call

## 2018-12-02 NOTE — Patient Instructions (Signed)
The patient is aware to call back for any questions or new concerns.  

## 2018-12-02 NOTE — Progress Notes (Signed)
Patient ID: [name], female   DOB: 1934-12-16, 84 y.o.   MRN: [id number]  Chief Complaint  Patient presents with  . Follow-up    dressing change    HPI [name] is a 84 y.o. female.  Here for dressing change left leg. She is here with her daughter, [name] and son, [name].   HPI  Past Medical History:  Diagnosis Date  . Arthritis    "back, hands, knees" (04/10/2016)  . Chronic atrial fibrillation    a. On Coumadin  . Chronic back pain   . 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 EF 25% by echo in 08/2015 b. RHC in 08/2015 showed normal filling pressures  . Chronic kidney disease   . Compression fracture    "several; all in my back" (04/10/2016)  . GERD (gastroesophageal reflux disease)   . Hypertension   . Lymphedema   . Pacemaker 05/2017  . S/P MVR (mitral valve replacement)    a. MVR 1994 b. redo MVR in 08/2014 - on Coumadin  . Shortness of breath dyspnea     Past Surgical History:  Procedure Laterality Date  . BREAST LUMPECTOMY Right 2005?   benign lump excision  . CARDIAC CATHETERIZATION    . [place], 2016   "MVR; MVR"  . CARDIOVERSION N/A 11/06/2018   Procedure: CARDIOVERSION;  Surgeon: Minna Merritts, MD;  Location: [facility];  Service: Cardiovascular;  Laterality: N/A;  . CATARACT EXTRACTION W/PHACO Right 02/20/2015   Procedure: CATARACT EXTRACTION PHACO AND INTRAOCULAR LENS PLACEMENT ([place]);  Surgeon: Estill Cotta, MD;  Location: [facility];  Service: Ophthalmology;  Laterality: Right;  US01:31.2 AP 25.3% CDE40.13 Fluid lot # [id number]  . DRESSING CHANGE UNDER ANESTHESIA Left 11/17/2018   Procedure: DRESSING CHANGE;  Surgeon: Robert Bellow, MD;  Location: [facility];  Service: General;  Laterality: Left;  no anesthesia  . ELECTROPHYSIOLOGIC STUDY N/A 10/09/2015   Procedure: CARDIOVERSION;  Surgeon: Wellington Hampshire, MD;  Location: [facility];  Service: Cardiovascular;   Laterality: N/A;  . INCISION AND DRAINAGE OF WOUND Left 11/13/2018   Procedure: LEFT LEG DRESSING CHANGE;  Surgeon: Robert Bellow, MD;  Location: [facility];  Service: General;  Laterality: Left;  . INCISION AND DRAINAGE OF WOUND Left 11/19/2018   Procedure: LEFT LEG DRESSING CHANGE AND GRAFT APPLICATION;  Surgeon: Robert Bellow, MD;  Location: [facility];  Service: General;  Laterality: Left;  . IR GENERIC HISTORICAL  04/01/2016   IR RADIOLOGIST EVAL & MGMT 04/01/2016 [facility]  . IR GENERIC HISTORICAL  04/15/2016   IR VERTEBROPLASTY CERV/THOR BX INC UNI/BIL INC/INJECT/IMAGING 04/15/2016 Luanne Bras, MD [facility]  . IR GENERIC HISTORICAL  04/15/2016   IR VERTEBROPLASTY EA ADDL (T&LS) BX INC UNI/BIL INC INJECT/IMAGING 04/15/2016 Luanne Bras, MD [facility]  . IR GENERIC HISTORICAL  04/15/2016   IR VERTEBROPLASTY EA ADDL (T&LS) BX INC UNI/BIL INC INJECT/IMAGING 04/15/2016 Luanne Bras, MD [facility]  . IR GENERIC HISTORICAL  05/20/2016   IR RADIOLOGIST EVAL & MGMT 05/20/2016 [facility]  . IR GENERIC HISTORICAL  06/13/2016   IR VERTEBROPLASTY EA ADDL (T&LS) BX INC UNI/BIL INC INJECT/IMAGING 06/13/2016 Luanne Bras, MD [facility]  . IR GENERIC HISTORICAL  06/13/2016   IR SACROPLASTY BILATERAL 06/13/2016 Luanne Bras, MD [facility]  . IR GENERIC HISTORICAL  06/13/2016   IR VERTEBROPLASTY LUMBAR BX INC UNI/BIL INC/INJECT/IMAGING 06/13/2016 Luanne Bras, MD [facility]  . IRRIGATION AND  DEBRIDEMENT HEMATOMA Left 07/05/2015   Procedure: IRRIGATION AND DEBRIDEMENT HEMATOMA;  Surgeon: Robert Bellow, MD;  Location: [facility];  Service: General;  Laterality: Left;  . IRRIGATION AND DEBRIDEMENT HEMATOMA Left 11/11/2018   Procedure: IRRIGATION AND DEBRIDEMENT LEFT LEG HEMATOMA;  Surgeon: Robert Bellow, MD;  Location: [facility];  Service: General;  Laterality: Left;  . pyloric stenosis  07/1937  . US ECHOCARDIOGRAPHY      Family History   Problem Relation Age of Onset  . Stroke Mother   . Renal Disease Father     Social History Social History   Tobacco Use  . Smoking status: Former Smoker    Packs/day: 1.00    Years: 27.00    Pack years: 27.00    Types: Cigarettes    Last attempt to quit: 07/01/1980    Years since quitting: 38.4  . Smokeless tobacco: Never Used  . Tobacco comment: "quit smoking ~ 1980  Substance Use Topics  . Alcohol use: Not Currently    Alcohol/week: 0.0 standard drinks    Comment: 04/10/2016 "I'll have a drink on holidays/special occasions"  . Drug use: No    Allergies  Allergen Reactions  . Ace Inhibitors     Cough  . Doxycycline Nausea Only  . Hydrocodone Itching and Nausea Only  . Mercury     Other reaction(s): Unknown  . Silver Dermatitis    Severe itching  . Cephalexin Rash  . Clindamycin/Lincomycin Rash  . Nickel Rash  . Penicillins Rash    Has patient had a PCN reaction causing immediate rash, facial/tongue/throat swelling, SOB or lightheadedness with hypotension: YES Has patient had a PCN reaction causing severe rash involving mucus membranes or skin necrosis: NO Has patient had a PCN reaction that required hospitalization NO Has patient had a PCN reaction occurring within the last 10 years: NO If all of the above answers are "NO", then may proceed with Cephalosporin use.    Current Outpatient Medications  Medication Sig Dispense Refill  . acetaminophen (TYLENOL) 650 MG CR tablet Take 1 tablet (650 mg total) by mouth every 6 (six) hours as needed for pain (mild and moderate pain).    Marland Kitchen albuterol (PROVENTIL) (2.5 MG/3ML) 0.083% nebulizer solution Take 3 mLs (2.5 mg total) by nebulization every 6 (six) hours as needed for wheezing or shortness of breath. 75 mL 12  . ALPRAZolam (XANAX) 0.25 MG tablet Take 1 tablet (0.25 mg total) by mouth at bedtime as needed for sleep. 20 tablet 0  . amiodarone (PACERONE) 200 MG tablet Take 1 tablet (200 mg total) by mouth daily. 90 tablet 3   . Cholecalciferol (VITAMIN D) 2000 units tablet Take 2,000 Units by mouth daily.    . furosemide (LASIX) 40 MG tablet Take 1 tablet (40 mg total) by mouth 2 (two) times daily. 60 tablet 1  . Menthol (ICY HOT) 5 % PTCH Apply 1 patch topically daily as needed (pain).    Marland Kitchen oxyCODONE (OXY IR/ROXICODONE) 5 MG immediate release tablet Take 0.5 tablets (2.5 mg total) by mouth every 6 (six) hours as needed for severe pain. 20 tablet 0  . polyethylene glycol (MIRALAX / GLYCOLAX) 17 g packet Take 17 g by mouth daily. 14 each 0  . potassium chloride SA (K-DUR) 20 MEQ tablet Take 1 tablet (20 mEq total) by mouth daily. 30 tablet 0  . Probiotic Product (RISA-BID PROBIOTIC PO) Take 1 capsule by mouth daily.     . vitamin C (ASCORBIC ACID) 500 MG tablet  Take 500 mg by mouth daily.    Marland Kitchen warfarin (COUMADIN) 4 MG tablet Take 1 tablet (4 mg total) by mouth one time only at 6 PM. 30 tablet 1  . zinc sulfate 220 (50 Zn) MG capsule Take 1 capsule (220 mg total) by mouth daily. 30 capsule 1   No current facility-administered medications for this visit.     Review of Systems Review of Systems  Constitutional: Negative.   Respiratory: Negative.   Cardiovascular: Negative.     Blood pressure 117/69, pulse 64, temperature 97.7 F (36.5 C), temperature source Temporal, height 4\' 11"  (1.499 m), weight 146 lb (66.2 kg), SpO2 94 %.  Physical Exam Physical Exam Constitutional:      Appearance: Normal appearance.  Musculoskeletal:       Legs:  Skin:    General: Skin is warm and dry.  Neurological:     Mental Status: She is alert and oriented to person, place, and time.  Psychiatric:        Behavior: Behavior normal.     Data Reviewed Video conference with Audelia Hives, DO  The wound was redressed with Mepitel, K-Y jelly saturated gauze, Kerlix and Ace wrap.  Well-tolerated.  Assessment Good integration of ACell graft material.  Plan Repeat dressing change in 8 days.   HPI, assessment, plan  and physical exam has been scribed under the direction and in the presence of Robert Bellow, MD. Karie Fetch, RN  I have completed the exam and reviewed the above documentation for accuracy and completeness.  I agree with the above.  Haematologist has been used and any errors in dictation or transcription are unintentional.  Hervey Ard, M.D., F.A.C.S.  Forest Gleason Unika Nazareno 12/02/2018, 2:37 PM

## 2018-12-03 ENCOUNTER — Telehealth: Payer: Medicare Other | Admitting: Family

## 2018-12-03 ENCOUNTER — Telehealth: Payer: Self-pay | Admitting: Cardiovascular Disease

## 2018-12-03 NOTE — Telephone Encounter (Signed)
Spoke with patient's daughter, ok per DPR. She answered the following questions on the patient's behalf.  Patient contacted regarding discharge from [facility] on 11/30/18.  Patient understands to follow up with provider Gollan on 12/09/18 at 1400 at [facility]. Patient understands discharge instructions? yes Patient understands medications and regiment? yes Patient understands to bring all medications to this visit? Yes  Patient has some memory issues and the daughter will benefit to coming to the appointment with the patient. She is aware that they will both be screened and need to wear a mask.

## 2018-12-03 NOTE — Telephone Encounter (Signed)
Patient wants sooner appt to discuss meds and current symptoms still present since hospital admission    Patient wants in ov and wants asap ok with seeing Sharolyn Douglas tomorrow if can add on in office

## 2018-12-03 NOTE — Telephone Encounter (Signed)
Attempted to call the patient's daughter, [name] to go over COVID-19 screening questions with her and confirm with her that they will need to enter through [facility] and wear a mask.  No answer- I left a message to please call back.

## 2018-12-03 NOTE — Telephone Encounter (Signed)
I spoke with the patient's son [name]). He states the patient's ok sats have been dropping. They were down to the 50's in the hospital and they were waiting to talk to Dr. Rockey Situ about this. The patient's son states that "someone" came to see her and "nothing" was done about her O2 sats prior to discharge.  She is currently at [facility].  Per [name], the patient has been on O2 for over a year.  She was previously on O2 at 2 L a rest & 4 L with exercise, now she is on 4 L at rest & 6 L with exercise.   They have several questions they would like clarified with the provider.  I offered an appointment tomorrow with Ignacia Bayley, NP and [name] is agreeable.  Appt scheduled for 6/5 at 2:30 pm with Ignacia Bayley, NP. - per [name], the patient will require some assistance to her appointment and his sister will bring the patient.  I advised that they will need to come through the [place] entrance and wear a mask the entirity of the visit.   Covid Screening questions asked- son answered on behalf of the patient.      COVID-19 Pre-Screening Questions:  . In the past 7 to 10 days have you had a cough,  shortness of breath, headache, congestion, fever (100 or greater) body aches, chills, sore throat, or sudden loss of taste or sense of smell? NO . Have you been around anyone with known Covid 19. NO . Have you been around anyone who is awaiting Covid 19 test results in the past 7 to 10 days? NO . Have you been around anyone who has been exposed to Covid 19, or has mentioned symptoms of Covid 19 within the past 7 to 10 days? NO  If you have any concerns/questions about symptoms patients report during screening (either on the phone or at threshold). Contact the provider seeing the patient or DOD for further guidance.  If neither are available contact a member of the leadership team.    I advised [name] I will attempt to contact his sister for screening questions as well. He is also advised that his  sister may not be allowed back with patient. This will be up to the discretion of the provider.  [name] voices understanding of the above.

## 2018-12-04 ENCOUNTER — Telehealth: Payer: Self-pay | Admitting: Nurse Practitioner

## 2018-12-04 ENCOUNTER — Other Ambulatory Visit
Admission: RE | Admit: 2018-12-04 | Discharge: 2018-12-04 | Disposition: A | Discharge status: Home / Self Care | Payer: Medicare Other | Source: Ambulatory Visit | Attending: Nurse Practitioner | Admitting: Nurse Practitioner

## 2018-12-04 ENCOUNTER — Ambulatory Visit (INDEPENDENT_AMBULATORY_CARE_PROVIDER_SITE_OTHER): Payer: Medicare Other | Admitting: Nurse Practitioner

## 2018-12-04 ENCOUNTER — Telehealth: Payer: Self-pay | Admitting: Physician Assistant

## 2018-12-04 ENCOUNTER — Ambulatory Visit
Admission: RE | Admit: 2018-12-04 | Discharge: 2018-12-04 | Disposition: A | Discharge status: Home / Self Care | Payer: Medicare Other | Source: Ambulatory Visit | Attending: Nurse Practitioner | Admitting: Nurse Practitioner

## 2018-12-04 ENCOUNTER — Encounter: Payer: Self-pay | Admitting: Nurse Practitioner

## 2018-12-04 ENCOUNTER — Other Ambulatory Visit: Payer: Self-pay

## 2018-12-04 ENCOUNTER — Inpatient Hospital Stay
Admission: EM | Admit: 2018-12-04 | Discharge: 2018-12-15 | DRG: 291 | Disposition: A | Discharge status: Home Health | Payer: Medicare Other | Source: Ambulatory Visit | Attending: Internal Medicine | Admitting: Internal Medicine

## 2018-12-04 VITALS — BP 128/58 | HR 62 | Resp 81 | Ht 59.0 in | Wt 136.0 lb

## 2018-12-04 DIAGNOSIS — I4819 Other persistent atrial fibrillation: Secondary | ICD-10-CM | POA: Diagnosis present

## 2018-12-04 DIAGNOSIS — I071 Rheumatic tricuspid insufficiency: Secondary | ICD-10-CM | POA: Diagnosis present

## 2018-12-04 DIAGNOSIS — I130 Hypertensive heart and chronic kidney disease with heart failure and stage 1 through stage 4 chronic kidney disease, or unspecified chronic kidney disease: Principal | ICD-10-CM | POA: Diagnosis present

## 2018-12-04 DIAGNOSIS — Z952 Presence of prosthetic heart valve: Secondary | ICD-10-CM

## 2018-12-04 DIAGNOSIS — I5023 Acute on chronic systolic (congestive) heart failure: Secondary | ICD-10-CM | POA: Insufficient documentation

## 2018-12-04 DIAGNOSIS — Z515 Encounter for palliative care: Secondary | ICD-10-CM | POA: Diagnosis not present

## 2018-12-04 DIAGNOSIS — N179 Acute kidney failure, unspecified: Secondary | ICD-10-CM | POA: Diagnosis present

## 2018-12-04 DIAGNOSIS — Z9981 Dependence on supplemental oxygen: Secondary | ICD-10-CM | POA: Diagnosis not present

## 2018-12-04 DIAGNOSIS — Z48817 Encounter for surgical aftercare following surgery on the skin and subcutaneous tissue: Secondary | ICD-10-CM | POA: Diagnosis not present

## 2018-12-04 DIAGNOSIS — I471 Supraventricular tachycardia: Secondary | ICD-10-CM | POA: Diagnosis present

## 2018-12-04 DIAGNOSIS — Z20828 Contact with and (suspected) exposure to other viral communicable diseases: Secondary | ICD-10-CM | POA: Diagnosis present

## 2018-12-04 DIAGNOSIS — I428 Other cardiomyopathies: Secondary | ICD-10-CM

## 2018-12-04 DIAGNOSIS — I10 Essential (primary) hypertension: Secondary | ICD-10-CM

## 2018-12-04 DIAGNOSIS — J9621 Acute and chronic respiratory failure with hypoxia: Secondary | ICD-10-CM | POA: Diagnosis present

## 2018-12-04 DIAGNOSIS — L299 Pruritus, unspecified: Secondary | ICD-10-CM | POA: Diagnosis present

## 2018-12-04 DIAGNOSIS — Z87891 Personal history of nicotine dependence: Secondary | ICD-10-CM | POA: Diagnosis not present

## 2018-12-04 DIAGNOSIS — L89890 Pressure ulcer of other site, unstageable: Secondary | ICD-10-CM | POA: Diagnosis present

## 2018-12-04 DIAGNOSIS — I480 Paroxysmal atrial fibrillation: Secondary | ICD-10-CM

## 2018-12-04 DIAGNOSIS — I890 Lymphedema, not elsewhere classified: Secondary | ICD-10-CM

## 2018-12-04 DIAGNOSIS — G8929 Other chronic pain: Secondary | ICD-10-CM | POA: Diagnosis present

## 2018-12-04 DIAGNOSIS — J449 Chronic obstructive pulmonary disease, unspecified: Secondary | ICD-10-CM | POA: Diagnosis present

## 2018-12-04 DIAGNOSIS — R0902 Hypoxemia: Secondary | ICD-10-CM

## 2018-12-04 DIAGNOSIS — R0602 Shortness of breath: Secondary | ICD-10-CM

## 2018-12-04 DIAGNOSIS — M549 Dorsalgia, unspecified: Secondary | ICD-10-CM | POA: Diagnosis present

## 2018-12-04 DIAGNOSIS — D500 Iron deficiency anemia secondary to blood loss (chronic): Secondary | ICD-10-CM | POA: Diagnosis present

## 2018-12-04 DIAGNOSIS — Z7901 Long term (current) use of anticoagulants: Secondary | ICD-10-CM

## 2018-12-04 DIAGNOSIS — J9811 Atelectasis: Secondary | ICD-10-CM | POA: Diagnosis present

## 2018-12-04 DIAGNOSIS — Z880 Allergy status to penicillin: Secondary | ICD-10-CM

## 2018-12-04 DIAGNOSIS — M7981 Nontraumatic hematoma of soft tissue: Secondary | ICD-10-CM | POA: Diagnosis not present

## 2018-12-04 DIAGNOSIS — Z7189 Other specified counseling: Secondary | ICD-10-CM

## 2018-12-04 DIAGNOSIS — E440 Moderate protein-calorie malnutrition: Secondary | ICD-10-CM | POA: Diagnosis present

## 2018-12-04 DIAGNOSIS — Z841 Family history of disorders of kidney and ureter: Secondary | ICD-10-CM

## 2018-12-04 DIAGNOSIS — I5043 Acute on chronic combined systolic (congestive) and diastolic (congestive) heart failure: Secondary | ICD-10-CM | POA: Diagnosis present

## 2018-12-04 DIAGNOSIS — Z6828 Body mass index (BMI) 28.0-28.9, adult: Secondary | ICD-10-CM

## 2018-12-04 DIAGNOSIS — I5022 Chronic systolic (congestive) heart failure: Secondary | ICD-10-CM | POA: Diagnosis not present

## 2018-12-04 DIAGNOSIS — N183 Chronic kidney disease, stage 3 unspecified: Secondary | ICD-10-CM

## 2018-12-04 DIAGNOSIS — R0609 Other forms of dyspnea: Secondary | ICD-10-CM

## 2018-12-04 DIAGNOSIS — F411 Generalized anxiety disorder: Secondary | ICD-10-CM | POA: Diagnosis present

## 2018-12-04 DIAGNOSIS — L8990 Pressure ulcer of unspecified site, unspecified stage: Secondary | ICD-10-CM | POA: Diagnosis present

## 2018-12-04 DIAGNOSIS — R233 Spontaneous ecchymoses: Secondary | ICD-10-CM

## 2018-12-04 DIAGNOSIS — S81802S Unspecified open wound, left lower leg, sequela: Secondary | ICD-10-CM | POA: Diagnosis not present

## 2018-12-04 DIAGNOSIS — I509 Heart failure, unspecified: Secondary | ICD-10-CM

## 2018-12-04 DIAGNOSIS — S81802A Unspecified open wound, left lower leg, initial encounter: Secondary | ICD-10-CM

## 2018-12-04 DIAGNOSIS — R0600 Dyspnea, unspecified: Secondary | ICD-10-CM

## 2018-12-04 DIAGNOSIS — Z823 Family history of stroke: Secondary | ICD-10-CM

## 2018-12-04 DIAGNOSIS — K219 Gastro-esophageal reflux disease without esophagitis: Secondary | ICD-10-CM | POA: Diagnosis present

## 2018-12-04 LAB — CBC WITH DIFFERENTIAL/PLATELET
Abs Immature Granulocytes: 0.03 10*3/uL (ref 0.00–0.07)
Abs Immature Granulocytes: 0.04 10*3/uL (ref 0.00–0.07)
Basophils Absolute: 0.1 10*3/uL (ref 0.0–0.1)
Basophils Absolute: 0.1 10*3/uL (ref 0.0–0.1)
Basophils Relative: 1 %
Basophils Relative: 1 %
Eosinophils Absolute: 0.2 10*3/uL (ref 0.0–0.5)
Eosinophils Absolute: 0.1 10*3/uL (ref 0.0–0.5)
Eosinophils Relative: 3 %
Eosinophils Relative: 1 %
HCT: 31.2 % — ABNORMAL LOW (ref 36.0–46.0)
HCT: 30.6 % — ABNORMAL LOW (ref 36.0–46.0)
Hemoglobin: 9.2 g/dL — ABNORMAL LOW (ref 12.0–15.0)
Hemoglobin: 9.2 g/dL — ABNORMAL LOW (ref 12.0–15.0)
Immature Granulocytes: 0 %
Immature Granulocytes: 1 %
Lymphocytes Relative: 9 %
Lymphocytes Relative: 9 %
Lymphs Abs: 0.6 10*3/uL — ABNORMAL LOW (ref 0.7–4.0)
Lymphs Abs: 0.5 10*3/uL — ABNORMAL LOW (ref 0.7–4.0)
MCH: 26.5 pg (ref 26.0–34.0)
MCH: 26.7 pg (ref 26.0–34.0)
MCHC: 29.5 g/dL — ABNORMAL LOW (ref 30.0–36.0)
MCHC: 30.1 g/dL (ref 30.0–36.0)
MCV: 89.9 fL (ref 80.0–100.0)
MCV: 89 fL (ref 80.0–100.0)
Monocytes Absolute: 0.7 10*3/uL (ref 0.1–1.0)
Monocytes Absolute: 0.6 10*3/uL (ref 0.1–1.0)
Monocytes Relative: 10 %
Monocytes Relative: 12 %
Neutro Abs: 5.2 10*3/uL (ref 1.7–7.7)
Neutro Abs: 4.2 10*3/uL (ref 1.7–7.7)
Neutrophils Relative %: 77 %
Neutrophils Relative %: 76 %
Platelets: 226 10*3/uL (ref 150–400)
Platelets: 206 10*3/uL (ref 150–400)
RBC: 3.47 MIL/uL — ABNORMAL LOW (ref 3.87–5.11)
RBC: 3.44 MIL/uL — ABNORMAL LOW (ref 3.87–5.11)
RDW: 16.4 % — ABNORMAL HIGH (ref 11.5–15.5)
RDW: 16.2 % — ABNORMAL HIGH (ref 11.5–15.5)
WBC: 6.8 10*3/uL (ref 4.0–10.5)
WBC: 5.5 10*3/uL (ref 4.0–10.5)
nRBC: 0 % (ref 0.0–0.2)
nRBC: 0 % (ref 0.0–0.2)

## 2018-12-04 LAB — COMPREHENSIVE METABOLIC PANEL WITH GFR
ALT: 15 U/L (ref 0–44)
AST: 21 U/L (ref 15–41)
Albumin: 3.7 g/dL (ref 3.5–5.0)
Alkaline Phosphatase: 92 U/L (ref 38–126)
Anion gap: 11 (ref 5–15)
BUN: 25 mg/dL — ABNORMAL HIGH (ref 8–23)
CO2: 30 mmol/L (ref 22–32)
Calcium: 9.2 mg/dL (ref 8.9–10.3)
Chloride: 100 mmol/L (ref 98–111)
Creatinine, Ser: 1.2 mg/dL — ABNORMAL HIGH (ref 0.44–1.00)
GFR calc Af Amer: 49 mL/min — ABNORMAL LOW
GFR calc non Af Amer: 42 mL/min — ABNORMAL LOW
Glucose, Bld: 94 mg/dL (ref 70–99)
Potassium: 4.8 mmol/L (ref 3.5–5.1)
Sodium: 141 mmol/L (ref 135–145)
Total Bilirubin: 1 mg/dL (ref 0.3–1.2)
Total Protein: 6.6 g/dL (ref 6.5–8.1)

## 2018-12-04 LAB — SARS CORONAVIRUS 2 BY RT PCR (HOSPITAL ORDER, PERFORMED IN ~~LOC~~ HOSPITAL LAB): SARS Coronavirus 2: NEGATIVE

## 2018-12-04 LAB — BLOOD GAS, ARTERIAL
Acid-Base Excess: 8.6 mmol/L — ABNORMAL HIGH (ref 0.0–2.0)
Bicarbonate: 33.5 mmol/L — ABNORMAL HIGH (ref 20.0–28.0)
FIO2: 0.36
O2 Saturation: 99.2 %
Patient temperature: 37
pCO2 arterial: 46 mmHg (ref 32.0–48.0)
pH, Arterial: 7.47 — ABNORMAL HIGH (ref 7.350–7.450)
pO2, Arterial: 132 mmHg — ABNORMAL HIGH (ref 83.0–108.0)

## 2018-12-04 LAB — TROPONIN I: Troponin I: 0.05 ng/mL (ref <0.03)

## 2018-12-04 LAB — COMPREHENSIVE METABOLIC PANEL
ALT: 15 U/L (ref 0–44)
AST: 20 U/L (ref 15–41)
Albumin: 3.5 g/dL (ref 3.5–5.0)
Alkaline Phosphatase: 89 U/L (ref 38–126)
Anion gap: 10 (ref 5–15)
BUN: 24 mg/dL — ABNORMAL HIGH (ref 8–23)
CO2: 30 mmol/L (ref 22–32)
Calcium: 8.9 mg/dL (ref 8.9–10.3)
Chloride: 99 mmol/L (ref 98–111)
Creatinine, Ser: 1.23 mg/dL — ABNORMAL HIGH (ref 0.44–1.00)
GFR calc Af Amer: 48 mL/min — ABNORMAL LOW (ref >60)
GFR calc non Af Amer: 41 mL/min — ABNORMAL LOW (ref >60)
Glucose, Bld: 98 mg/dL (ref 70–99)
Potassium: 4 mmol/L (ref 3.5–5.1)
Sodium: 139 mmol/L (ref 135–145)
Total Bilirubin: 0.9 mg/dL (ref 0.3–1.2)
Total Protein: 6.2 g/dL — ABNORMAL LOW (ref 6.5–8.1)

## 2018-12-04 LAB — MAGNESIUM: Magnesium: 2.2 mg/dL (ref 1.7–2.4)

## 2018-12-04 LAB — BRAIN NATRIURETIC PEPTIDE
B Natriuretic Peptide: 1189 pg/mL — ABNORMAL HIGH (ref 0.0–100.0)
B Natriuretic Peptide: 992 pg/mL — ABNORMAL HIGH (ref 0.0–100.0)

## 2018-12-04 LAB — APTT: aPTT: 47 seconds — ABNORMAL HIGH (ref 24–36)

## 2018-12-04 LAB — PROTIME-INR
INR: 3.1 — ABNORMAL HIGH (ref 0.8–1.2)
Prothrombin Time: 31.3 s — ABNORMAL HIGH (ref 11.4–15.2)

## 2018-12-04 LAB — MRSA PCR SCREENING: MRSA by PCR: NEGATIVE

## 2018-12-04 MED ORDER — SODIUM CHLORIDE 0.9% FLUSH
3.0000 mL | Freq: Two times a day (BID) | INTRAVENOUS | Status: DC
  Administered 2018-12-04 – 2018-12-15 (×22): 3 mL via INTRAVENOUS

## 2018-12-04 MED ORDER — FUROSEMIDE 10 MG/ML IJ SOLN
40.0000 mg | Freq: Two times a day (BID) | INTRAMUSCULAR | Status: DC
  Administered 2018-12-05: 40 mg via INTRAVENOUS
  Filled 2018-12-04: qty 4

## 2018-12-04 MED ORDER — AMIODARONE HCL 200 MG PO TABS
200.0000 mg | ORAL_TABLET | Freq: Every day | ORAL | Status: DC
  Administered 2018-12-05 – 2018-12-15 (×11): 200 mg via ORAL
  Filled 2018-12-04 (×11): qty 1

## 2018-12-04 MED ORDER — VITAMIN C 500 MG PO TABS
500.0000 mg | ORAL_TABLET | Freq: Every day | ORAL | Status: DC
  Administered 2018-12-05 – 2018-12-15 (×11): 500 mg via ORAL
  Filled 2018-12-04 (×11): qty 1

## 2018-12-04 MED ORDER — ALBUTEROL SULFATE (2.5 MG/3ML) 0.083% IN NEBU
2.5000 mg | INHALATION_SOLUTION | Freq: Four times a day (QID) | RESPIRATORY_TRACT | Status: DC | PRN
  Administered 2018-12-05 – 2018-12-11 (×3): 2.5 mg via RESPIRATORY_TRACT
  Filled 2018-12-04 (×4): qty 3

## 2018-12-04 MED ORDER — POTASSIUM CHLORIDE CRYS ER 20 MEQ PO TBCR
20.0000 meq | EXTENDED_RELEASE_TABLET | Freq: Every day | ORAL | Status: DC
  Administered 2018-12-05 – 2018-12-15 (×11): 20 meq via ORAL
  Filled 2018-12-04 (×11): qty 1

## 2018-12-04 MED ORDER — POLYETHYLENE GLYCOL 3350 17 G PO PACK
17.0000 g | PACK | Freq: Every day | ORAL | Status: DC
  Administered 2018-12-08 – 2018-12-09 (×2): 17 g via ORAL
  Filled 2018-12-04 (×10): qty 1

## 2018-12-04 MED ORDER — ONDANSETRON HCL 4 MG/2ML IJ SOLN: 4.0000 mg | Freq: Four times a day (QID) | INTRAMUSCULAR | Status: DC | PRN

## 2018-12-04 MED ORDER — WARFARIN SODIUM 4 MG PO TABS
4.0000 mg | ORAL_TABLET | Freq: Once | ORAL | Status: DC
  Filled 2018-12-04: qty 1

## 2018-12-04 MED ORDER — SODIUM CHLORIDE 0.9% FLUSH: 3.0000 mL | INTRAVENOUS | Status: DC | PRN

## 2018-12-04 MED ORDER — ALPRAZOLAM 0.25 MG PO TABS: 0.2500 mg | ORAL_TABLET | Freq: Every evening | ORAL | Status: DC | PRN

## 2018-12-04 MED ORDER — ZINC SULFATE 220 (50 ZN) MG PO CAPS
220.0000 mg | ORAL_CAPSULE | Freq: Every day | ORAL | Status: DC
  Administered 2018-12-05 – 2018-12-15 (×10): 220 mg via ORAL
  Filled 2018-12-04 (×11): qty 1

## 2018-12-04 MED ORDER — VITAMIN D3 25 MCG (1000 UNIT) PO TABS
2000.0000 [IU] | ORAL_TABLET | Freq: Every day | ORAL | Status: DC
  Administered 2018-12-05 – 2018-12-15 (×11): 2000 [IU] via ORAL
  Filled 2018-12-04 (×13): qty 2

## 2018-12-04 MED ORDER — IPRATROPIUM-ALBUTEROL 0.5-2.5 (3) MG/3ML IN SOLN
3.0000 mL | Freq: Four times a day (QID) | RESPIRATORY_TRACT | Status: DC | PRN
  Administered 2018-12-10: 3 mL via RESPIRATORY_TRACT
  Filled 2018-12-04: qty 3

## 2018-12-04 MED ORDER — FUROSEMIDE 10 MG/ML IJ SOLN
20.0000 mg | Freq: Once | INTRAMUSCULAR | Status: AC
  Administered 2018-12-04: 20 mg via INTRAVENOUS
  Filled 2018-12-04: qty 2

## 2018-12-04 MED ORDER — SODIUM CHLORIDE 0.9 % IV SOLN: 250.0000 mL | INTRAVENOUS | Status: DC | PRN

## 2018-12-04 MED ORDER — ACETAMINOPHEN 325 MG PO TABS
650.0000 mg | ORAL_TABLET | ORAL | Status: DC | PRN
  Administered 2018-12-04 – 2018-12-05 (×2): 650 mg via ORAL
  Filled 2018-12-04 (×2): qty 2

## 2018-12-04 NOTE — Patient Instructions (Addendum)
Medication Instructions:  - Your physician recommends that you continue on your current medications as directed. Please refer to the Current Medication list given to you today.  If you need a refill on your cardiac medications before your next appointment, please call your pharmacy.   Lab work: - Your physician recommends that you have lab work today : Arterial Blood Gas (ABG)/ CBC/ CMET/ BNP/ INR  If you have labs (blood work) drawn today and your tests are completely normal, you will receive your results only by: Marland Kitchen MyChart Message (if you have MyChart) OR . A paper copy in the mail If you have any lab test that is abnormal or we need to change your treatment, we will call you to review the results.  Testing/Procedures: - A chest x-ray takes a picture of the organs and structures inside the chest, including the heart, lungs, and blood vessels. This test can show several things, including, whether the heart is enlarges; whether fluid is building up in the lungs; and whether pacemaker / defibrillator leads are still in place.  Follow-Up: At [facility], you and your health needs are our priority.  As part of our continuing mission to provide you with exceptional heart care, we have created designated Provider Care Teams.  These Care Teams include your primary Cardiologist (physician) and Advanced Practice Providers (APPs -  Physician Assistants and Nurse Practitioners) who all work together to provide you with the care you need, when you need it. . in 1 week with Dr. Rockey Situ (in office)  . Thursday 6/11 at 2:40 pm   Any Other Special Instructions Will Be Listed Below (If Applicable). - N/A

## 2018-12-04 NOTE — Progress Notes (Addendum)
Office Visit    Patient Name: [name] Date of Encounter: 12/04/2018  Primary Care Provider:  Rusty Aus, MD Primary Cardiologist:  Ida Rogue, MD  Chief Complaint    84 year old female with history of atrial fibrillation, chronic combined heart failure, nonischemic cardiomyopathy status post CRT-P, left bundle branch block, hypertension, chronic lower extremity edema, mitral valve disease status post mechanical mitral valve replacement on chronic Coumadin, and chronic kidney disease, who presents for follow-up after recent hospitalization related to respiratory failure, hypoxia, heart failure, and spontaneous left calf hematoma requiring debridement.  Past Medical History    Past Medical History:  Diagnosis Date   Arthritis    "back, hands, knees" (04/10/2016)   Chronic back pain    Chronic combined systolic (congestive) and diastolic (congestive) heart failure (HCC)    a. EF 25% by echo in 08/2015 b. RHC in 08/2015 showed normal filling pressures; c. 10/2018 Echo (in setting of atrial tachycardia): EF 25-30%, impaired relaxation. Glob HK. RVSP 58mmHg. Mod dil RA/LA. Mech MV w/ nl fxn (mean grad 57mmHg). Mod TR.    Chronic kidney disease    Compression fracture    "several; all in my back" (04/10/2016)   GERD (gastroesophageal reflux disease)    Hypertension    Lymphedema    Pacemaker 05/2017   PAF (paroxysmal atrial fibrillation) ([place])    a. On amio & Coumadin - CHA2DS2VASc = 4.   S/P MVR (mitral valve replacement)    a. MVR 1994 b. redo MVR in 08/2014 - on Coumadin   Shortness of breath dyspnea    Past Surgical History:  Procedure Laterality Date   BREAST LUMPECTOMY Right 2005?   benign lump excision   CARDIAC CATHETERIZATION     CARDIAC VALVE REPLACEMENT  1994, 2016   "MVR; MVR"   CARDIOVERSION N/A 11/06/2018   Procedure: CARDIOVERSION;  Surgeon: Minna Merritts, MD;  Location: [facility];  Service: Cardiovascular;  Laterality: N/A;    CATARACT EXTRACTION W/PHACO Right 02/20/2015   Procedure: CATARACT EXTRACTION PHACO AND INTRAOCULAR LENS PLACEMENT ([place]);  Surgeon: Estill Cotta, MD;  Location: [facility];  Service: Ophthalmology;  Laterality: Right;  US01:31.2 AP 25.3% CDE40.13 Fluid lot # [id number]   DRESSING CHANGE UNDER ANESTHESIA Left 11/17/2018   Procedure: DRESSING CHANGE;  Surgeon: Robert Bellow, MD;  Location: [facility];  Service: General;  Laterality: Left;  no anesthesia   ELECTROPHYSIOLOGIC STUDY N/A 10/09/2015   Procedure: CARDIOVERSION;  Surgeon: Wellington Hampshire, MD;  Location: [facility];  Service: Cardiovascular;  Laterality: N/A;   INCISION AND DRAINAGE OF WOUND Left 11/13/2018   Procedure: LEFT LEG DRESSING CHANGE;  Surgeon: Robert Bellow, MD;  Location: [facility];  Service: General;  Laterality: Left;   INCISION AND DRAINAGE OF WOUND Left 11/19/2018   Procedure: LEFT LEG DRESSING CHANGE AND GRAFT APPLICATION;  Surgeon: Robert Bellow, MD;  Location: [facility];  Service: General;  Laterality: Left;   IR GENERIC HISTORICAL  04/01/2016   IR RADIOLOGIST EVAL & MGMT 04/01/2016 [facility]   IR GENERIC HISTORICAL  04/15/2016   IR VERTEBROPLASTY CERV/THOR BX INC UNI/BIL INC/INJECT/IMAGING 04/15/2016 Luanne Bras, MD [facility]   IR GENERIC HISTORICAL  04/15/2016   IR VERTEBROPLASTY EA ADDL (T&LS) BX INC UNI/BIL INC INJECT/IMAGING 04/15/2016 Luanne Bras, MD [facility]   IR GENERIC HISTORICAL  04/15/2016   IR VERTEBROPLASTY EA ADDL (T&LS) BX INC UNI/BIL INC INJECT/IMAGING 04/15/2016 Luanne Bras, MD [facility]   IR GENERIC HISTORICAL  05/20/2016  IR RADIOLOGIST EVAL & MGMT 05/20/2016 [facility]   IR GENERIC HISTORICAL  06/13/2016   IR VERTEBROPLASTY EA ADDL (T&LS) BX INC UNI/BIL INC INJECT/IMAGING 06/13/2016 Luanne Bras, MD [facility]   IR GENERIC HISTORICAL  06/13/2016   IR SACROPLASTY BILATERAL 06/13/2016 Luanne Bras, MD [facility]   IR GENERIC  HISTORICAL  06/13/2016   IR VERTEBROPLASTY LUMBAR BX INC UNI/BIL INC/INJECT/IMAGING 06/13/2016 Luanne Bras, MD [facility]   IRRIGATION AND DEBRIDEMENT HEMATOMA Left 07/05/2015   Procedure: IRRIGATION AND DEBRIDEMENT HEMATOMA;  Surgeon: Robert Bellow, MD;  Location: [facility];  Service: General;  Laterality: Left;   IRRIGATION AND DEBRIDEMENT HEMATOMA Left 11/11/2018   Procedure: IRRIGATION AND DEBRIDEMENT LEFT LEG HEMATOMA;  Surgeon: Robert Bellow, MD;  Location: [facility];  Service: General;  Laterality: Left;   pyloric stenosis  07/1937   US ECHOCARDIOGRAPHY      Allergies  Allergies  Allergen Reactions   Ace Inhibitors     Cough   Doxycycline Nausea Only   Hydrocodone Itching and Nausea Only   Mercury     Other reaction(s): Unknown   Silver Dermatitis    Severe itching   Cephalexin Rash   Clindamycin/Lincomycin Rash   Nickel Rash   Penicillins Rash    Has patient had a PCN reaction causing immediate rash, facial/tongue/throat swelling, SOB or lightheadedness with hypotension: YES Has patient had a PCN reaction causing severe rash involving mucus membranes or skin necrosis: NO Has patient had a PCN reaction that required hospitalization NO Has patient had a PCN reaction occurring within the last 10 years: NO If all of the above answers are "NO", then may proceed with Cephalosporin use.    History of Present Illness    84 year old female with the above complex past medical history including paroxysmal atrial fibrillation on chronic amiodarone, chronic combined systolic and diastolic congestive heart failure, nonischemic cardiomyopathy status post CRT-P in 11/2017 (difficulty placing LV lead w/o change in CHF Ss after), left bundle branch block, hypertension, chronic lower extremity edema, mitral valve disease status post mechanical mitral valve replacement on chronic Coumadin, and chronic kidney disease.  She was admitted to [facility] May 6 with  worsening renal function in the setting of escalating diuretic dosing due to progressive dyspnea.  Echocardiogram during that admission showed an EF of 25 to 30%, which was down from prior EF of approximately 40%.  She was also noted to be in atrial tachycardia and underwent successful cardioversion on May 8.  She was discharged home May 10 in the setting of a slightly supratherapeutic INR and shortly thereafter noticed worsening left calf pain with development of a small spontaneous hematoma.  Unfortunately, this grew significantly over the span of just a few hours and she presented to the emergency department was found to have a drop in hemoglobin from 11.3-8.9.  This trended down further to 7.7 prompting 2 transfusion of 2 units of packed red blood cells.  She did require 1 additional unit during her hospitalization.  Surgery was consulted and she required debridement with biological graft placement on May 13.  Plastic surgery was subsequently consulted and she underwent Acell placement on May 21.  She required 10-day treatment with Cipro in the setting of Enterobacter on wound culture.  Though Coumadin was initially held, it was eventually resumed when felt to be safe from a surgical standpoint.  Volume status and renal function were also relatively stable and she did not have any further rhythm disturbances.  She continued  to have desaturations into the 80's with ambulation, however the hospitalist team felt that she likely needed more PT and so after a long hospital course, she was finally discharged to [facility] on June 1.  Per discharge summary, she refused a chest x-ray on the day of discharge.  Since then, she has been working with physical therapy at rehab but is continued to have significant dyspnea with minimal exertion despite using 6 L/min with activity.  She desaturates into the 80s on 6 L.  At rest on 4 L, she is at 99% however.  Patient's daughter is present with her today and notes that  at a postop surgical appointment the other day, her oxygen ran out and she dropped into the 20s.  EMS had to be called in order to transport her back to [facility] on oxygen.  She has not been having any chest pain.  She does have mild chronic bilateral lower extremity swelling which is mostly dependent in nature.  She remains on Lasix 40 mg twice daily though notes that this is a lower dose than what she was previously on.  This dose was reduced secondary to hypotension and mild renal insufficiency in the setting of anemia during her hospitalization.  Despite reduction in Lasix dosing, her weight is listed is down 10 pounds since her hospitalization.  Patient says she has not been eating as well since discharge.  She denies palpitations, PND, orthopnea, dizziness, syncope, or early satiety.  Bilateral lower extremities remain tender in her left lower leg is wrapped.  Home Medications    Prior to Admission medications   Medication Sig Start Date End Date Taking? Authorizing Provider  acetaminophen (TYLENOL) 650 MG CR tablet Take 1 tablet (650 mg total) by mouth every 6 (six) hours as needed for pain (mild and moderate pain). 11/30/18   Gladstone Lighter, MD  albuterol (PROVENTIL) (2.5 MG/3ML) 0.083% nebulizer solution Take 3 mLs (2.5 mg total) by nebulization every 6 (six) hours as needed for wheezing or shortness of breath. 11/30/18   Gladstone Lighter, MD  ALPRAZolam Duanne Moron) 0.25 MG tablet Take 1 tablet (0.25 mg total) by mouth at bedtime as needed for sleep. 11/30/18   Gladstone Lighter, MD  amiodarone (PACERONE) 200 MG tablet Take 1 tablet (200 mg total) by mouth daily. 09/24/18   Minna Merritts, MD  Cholecalciferol (VITAMIN D) 2000 units tablet Take 2,000 Units by mouth daily.    [provider]  furosemide (LASIX) 40 MG tablet Take 1 tablet (40 mg total) by mouth 2 (two) times daily. 11/30/18   Gladstone Lighter, MD  Menthol (ICY HOT) 5 % PTCH Apply 1 patch topically daily as needed (pain).     [provider]  oxyCODONE (OXY IR/ROXICODONE) 5 MG immediate release tablet Take 0.5 tablets (2.5 mg total) by mouth every 6 (six) hours as needed for severe pain. 11/30/18   Gladstone Lighter, MD  polyethylene glycol (MIRALAX / GLYCOLAX) 17 g packet Take 17 g by mouth daily. 11/30/18   Gladstone Lighter, MD  potassium chloride SA (K-DUR) 20 MEQ tablet Take 1 tablet (20 mEq total) by mouth daily. 11/30/18   Gladstone Lighter, MD  Probiotic Product (RISA-BID PROBIOTIC PO) Take 1 capsule by mouth daily.     [provider]  vitamin C (ASCORBIC ACID) 500 MG tablet Take 500 mg by mouth daily.    [provider]  warfarin (COUMADIN) 4 MG tablet Take 1 tablet (4 mg total) by mouth one time only  at 6 PM. 11/30/18   Gladstone Lighter, MD  zinc sulfate 220 (50 Zn) MG capsule Take 1 capsule (220 mg total) by mouth daily. 12/01/18   Gladstone Lighter, MD    Review of Systems    Ongoing DOE and hypoxia w/ activity as outlined above.  Ongoing bilat LEE - unable to use lymphedema pumps due to bruising/healing/tenderness.  She denies chest pain, palpitations, PND, orthopnea, dizziness, syncope, early satiety, or weight gain.  All other systems reviewed and are otherwise negative except as noted above.  Physical Exam    VS:  BP (!) 128/58 (BP Location: Left Arm, Patient Position: Sitting, Cuff Size: Normal)    Pulse 62    Resp (!) 81 Comment: ROOM air   Ht 4\' 11"  (1.499 m)    Wt 136 lb (61.7 kg)    SpO2 98% Comment: 4 L O2   BMI 27.47 kg/m  , BMI Body mass index is 27.47 kg/m. GEN: Well nourished, well developed, in no acute distress. HEENT: normal. Neck: Supple, no JVD, carotid bruits, or masses. Cardiac: RRR, Mechanical S1 with a 2/6 systolic murmur at the left lower sternal border, no rubs, or gallops. No clubbing, cyanosis.  1+ right lower extremity and bilateral posterior thigh edema.  The left lower leg is wrapped.  Radials/DP/PT 2+ and equal bilaterally.  Respiratory:   Respirations regular and unlabored, diminished breath sounds at bilateral bases with fine crackles just above.  Otherwise clear to auscultation. GI: Soft, nontender, nondistended, BS + x 4. MS: no deformity or atrophy. Skin: warm and dry, no rash. Neuro:  Strength and sensation are intact. Psych: Normal affect.  Accessory Clinical Findings    ECG personally reviewed by me today -AV paced, 61- no acute changes.  Assessment & Plan    1.  Dyspnea and hypoxia: Patient was initially admitted in early May with heart failure symptoms in the setting of atrial tachycardia requiring cardioversion.  She did require diuresis during that admission.  She was subsequently readmitted in the setting of left calf spontaneous hematoma and anemia requiring transfusion, debridement, and plastic surgery.  The second hospitalization was a prolonged hospitalization and complicated by hypoxia despite relative euvolemia.  She was discharged home on oxygen at 4 L/min at rest to be increased to 6 L/min with activity secondary to desaturations into the 80s otherwise.  Since discharge, she has been at rehab and working with physical therapy and has continued to have documented hypoxia into the 80s on 6 L/min.  Today, saturation at rest is 99% on 4 L.  She dropped to 81% at rest on room air. She does have lower extremity swelling but her weight is down and she has no significant JVD.  She has chronic lower extremity swelling and typically uses lymphedema pumps has not been able to do this in the setting of left lower extremity healing and ongoing bruising and tenderness to the right lower extremity.  She does have diminished breath sounds and fine crackles at the bases.  I am going to obtain a stat PA and lateral chest x-ray as well as CBC, basic metabolic panel, BNP, albumin, and ABG.  Further recommendations following testing.  If testing relatively unrevealing, I would have concern for potential role of long-term amiodarone  therapy and would refer to pulmonology.    2.  HFrEF/nonischemic cardiomyopathy: Previously known echo of 40% with subsequent drop in early May in the setting of rapid atrial tachycardia.  EF at that time was 25 to 30%  with diastolic dysfunction and global hypokinesis.  The mitral valve was functioning normally.  As above, she is not particularly volume overloaded and her weight is down.  She has chronic lower extremity swelling.  She is on a lower dose of Lasix than what she had been taking previously.  Following up chest x-ray and labs as above.  She remains on losartan but carvedilol and Spironolactone were stopped during her hospitalization due to hypotension in the setting of anemia and also acute renal insufficiency early on in the hospitalization.  Pressure stable today at 120/58 and we can potentially look to add back carvedilol and eventually spironolactone pending labs.  3.  Atrial tachycardia/paroxysmal atrial fibrillation: AV paced today.  Recent hospitalization for atrial tachycardia and heart failure requiring cardioversion in early May.  She denies any recurrent palpitations.  She is chronically anticoagulated on Coumadin.  Follow-up INR today.  4.  Status post mitral valve replacement: Normal functioning prosthetic valve on echo in early May.  Follow-up INR.  5.  Acute blood loss anemia: In the setting of spontaneous left calf hematoma.  Follow-up CBC today in the setting of ongoing dyspnea and hypoxia.  6.  Spontaneous left calf hematoma: Status post debridement and plastic surgery.  Seen by surgery earlier this week and recovering slowly.  Following up CBC today.  7.  Essential hypertension: Currently stable on losartan therapy.  Awaiting labs and will consider resumption of low-dose carvedilol plus minus spironolactone in the setting of cardiomyopathy.  8. Stage III chronic kidney disease: Stable during the last 2 weeks of her hospitalization.  Follow-up basic metabolic panel  today.  9.  Chronic lower extremity lymphedema: Relatively stable though she is not able to use lymphedema pumps at this time secondary to tenderness, healing, and bruising.  Apparently her appetite has been poor.  I will follow-up an albumin.  10.  Disposition: Follow-up labs and x-ray today.  There is a chance that she will require referral to the emergency room pending results.  We will arrange for follow-up with Dr. Rockey Situ in 1 week.  Murray Hodgkins, NP 12/04/2018, 4:28 PM

## 2018-12-04 NOTE — Progress Notes (Signed)
Advance care planning  Purpose of Encounter CHF  Parties in Attendance Patient  Patients Decisional capacity Note and oriented.  Able to make medical decisions.  Documented healthcare power of attorney is daughter [name]  Discussed in detail regarding CHF.  Treatment plan , prognosis discussed.  All questions answered  Discussed regarding CODE STATUS.  Patient was DNR during prior admission.  On confirming if she would like to continue that status patient tells me that she feels much improved from her prior admission and would like to be a full code with aggressive medical care.  Ventilator/defibrillation/CPR if needed.  Orders entered and CODE STATUS changed  FULL CODE  Time spent - 17 minutes

## 2018-12-04 NOTE — Progress Notes (Signed)
Patient arrived in the unit as a direct admit from Dr. Donivan Scull office. VSS, called [facility] to admit patient.

## 2018-12-04 NOTE — Telephone Encounter (Signed)
   Dr. Percival Spanish got a call from patient's daughter inquiring about whether patient could be direct admitted to the hospital. He contacted bed control at [facility] and has arranged for her to be admitted to internal medicine. Daughter made aware that they will be reaching out with information.  Meara Wiechman PA-C

## 2018-12-04 NOTE — Progress Notes (Signed)
MD made aware of TIS of 0.05. Will continue to monitor and assess.

## 2018-12-04 NOTE — H&P (Signed)
[place] [facility] [place] NAME: [name]    MR#:  [id number]  DATE OF BIRTH:  Nov 07, 1934  DATE OF ADMISSION:  12/04/2018  PRIMARY CARE PHYSICIAN: Rusty Aus, MD   REQUESTING/REFERRING PHYSICIAN: Leroy Sea, NP  CHIEF COMPLAINT:  No chief complaint on file.   HISTORY OF PRESENT ILLNESS:  [name]  is a 84 y.o. female with a known history of atrial fibrillation, combined CHF, nonischemic cardiomyopathy, hypertension, chronic lower extremity edema, mitral valve disease status post mechanical valve replacement and currently on anticoagulation therapy with Coumadin.  Patient also has a history of CKD.  She was referred for direct admission from her cardiologist, Dr. Rockey Situ, office for acute exacerbation of combined CHF.  Ejection fraction on echocardiogram completed 11/05/2018 was 25 to 30% which was decreased from 40% in December 2019.  Patient has been admitted twice over the last month.  Initial admission was related to exacerbation of CHF with chronic renal failure as well as an episode of atrial tachycardia.  She underwent cardioversion on May 8.  After patient was discharged home, she returned with large spontaneous hematoma to her left calf which ultimately required debridement and biological graft placement which occurred on May 13.  Patient was then discharged to rehabilitation center where she has been up to this point.  She now returns to be admitted from the cardiologist office for exacerbation of her CHF with increased edema of her lower extremities as well as increased shortness of breath with minimal exertion and at rest when lying flat.  According to documentation, Lasix has already been decreased to 40 mg twice daily which is decreased from 80 mg twice daily.  Was decreased due to hypotension and mild renal insufficiency.  Chest x-ray today demonstrates mild pulmonary edema with BNP of 996.  She denies chest pain.  She  denies productive cough.  She denies palpitations.  She notes orthopnea at times.  She denies experiencing dizziness.  She denies PND.  Patient denies syncopal episodes.  She is on oxygen at 2 to 3 L/min 24 hours a day.  We have admitted her to the hospital service for continued management with cardiology consultation.  PAST MEDICAL HISTORY:   Past Medical History:  Diagnosis Date  . Arthritis    "back, hands, knees" (04/10/2016)  . Chronic back pain   . 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 EF 25% by echo in 08/2015 b. RHC in 08/2015 showed normal filling pressures; c. 10/2018 Echo (in setting of atrial tachycardia): EF 25-30%, impaired relaxation. Glob HK. RVSP 8mmHg. Mod dil RA/LA. Mech MV w/ nl fxn (mean grad 52mmHg). Mod TR.   Marland Kitchen Chronic kidney disease   . Compression fracture    "several; all in my back" (04/10/2016)  . GERD (gastroesophageal reflux disease)   . Hypertension   . Lymphedema   . Pacemaker 05/2017  . PAF (paroxysmal atrial fibrillation) ([place])    a. On amio & Coumadin - CHA2DS2VASc = 4.  . S/P MVR (mitral valve replacement)    a. MVR 1994 b. redo MVR in 08/2014 - on Coumadin  . Shortness of breath dyspnea     PAST SURGICAL HISTORY:   Past Surgical History:  Procedure Laterality Date  . BREAST LUMPECTOMY Right 2005?   benign lump excision  . CARDIAC CATHETERIZATION    . [place], 2016   "MVR; MVR"  . CARDIOVERSION N/A  11/06/2018   Procedure: CARDIOVERSION;  Surgeon: Minna Merritts, MD;  Location: [facility];  Service: Cardiovascular;  Laterality: N/A;  . CATARACT EXTRACTION W/PHACO Right 02/20/2015   Procedure: CATARACT EXTRACTION PHACO AND INTRAOCULAR LENS PLACEMENT ([place]);  Surgeon: Estill Cotta, MD;  Location: [facility];  Service: Ophthalmology;  Laterality: Right;  US01:31.2 AP 25.3% CDE40.13 Fluid lot # [id number]  . DRESSING CHANGE UNDER ANESTHESIA Left 11/17/2018   Procedure:  DRESSING CHANGE;  Surgeon: Robert Bellow, MD;  Location: [facility];  Service: General;  Laterality: Left;  no anesthesia  . ELECTROPHYSIOLOGIC STUDY N/A 10/09/2015   Procedure: CARDIOVERSION;  Surgeon: Wellington Hampshire, MD;  Location: [facility];  Service: Cardiovascular;  Laterality: N/A;  . INCISION AND DRAINAGE OF WOUND Left 11/13/2018   Procedure: LEFT LEG DRESSING CHANGE;  Surgeon: Robert Bellow, MD;  Location: [facility];  Service: General;  Laterality: Left;  . INCISION AND DRAINAGE OF WOUND Left 11/19/2018   Procedure: LEFT LEG DRESSING CHANGE AND GRAFT APPLICATION;  Surgeon: Robert Bellow, MD;  Location: [facility];  Service: General;  Laterality: Left;  . IR GENERIC HISTORICAL  04/01/2016   IR RADIOLOGIST EVAL & MGMT 04/01/2016 [facility]  . IR GENERIC HISTORICAL  04/15/2016   IR VERTEBROPLASTY CERV/THOR BX INC UNI/BIL INC/INJECT/IMAGING 04/15/2016 Luanne Bras, MD [facility]  . IR GENERIC HISTORICAL  04/15/2016   IR VERTEBROPLASTY EA ADDL (T&LS) BX INC UNI/BIL INC INJECT/IMAGING 04/15/2016 Luanne Bras, MD [facility]  . IR GENERIC HISTORICAL  04/15/2016   IR VERTEBROPLASTY EA ADDL (T&LS) BX INC UNI/BIL INC INJECT/IMAGING 04/15/2016 Luanne Bras, MD [facility]  . IR GENERIC HISTORICAL  05/20/2016   IR RADIOLOGIST EVAL & MGMT 05/20/2016 [facility]  . IR GENERIC HISTORICAL  06/13/2016   IR VERTEBROPLASTY EA ADDL (T&LS) BX INC UNI/BIL INC INJECT/IMAGING 06/13/2016 Luanne Bras, MD [facility]  . IR GENERIC HISTORICAL  06/13/2016   IR SACROPLASTY BILATERAL 06/13/2016 Luanne Bras, MD [facility]  . IR GENERIC HISTORICAL  06/13/2016   IR VERTEBROPLASTY LUMBAR BX INC UNI/BIL INC/INJECT/IMAGING 06/13/2016 Luanne Bras, MD [facility]  . IRRIGATION AND DEBRIDEMENT HEMATOMA Left 07/05/2015   Procedure: IRRIGATION AND DEBRIDEMENT HEMATOMA;  Surgeon: Robert Bellow, MD;  Location: [facility];  Service: General;  Laterality: Left;  .  IRRIGATION AND DEBRIDEMENT HEMATOMA Left 11/11/2018   Procedure: IRRIGATION AND DEBRIDEMENT LEFT LEG HEMATOMA;  Surgeon: Robert Bellow, MD;  Location: [facility];  Service: General;  Laterality: Left;  . pyloric stenosis  07/1937  . US ECHOCARDIOGRAPHY      SOCIAL HISTORY:   Social History   Tobacco Use  . Smoking status: Former Smoker    Packs/day: 1.00    Years: 27.00    Pack years: 27.00    Types: Cigarettes    Last attempt to quit: 07/01/1980    Years since quitting: 38.4  . Smokeless tobacco: Never Used  . Tobacco comment: "quit smoking ~ 1980  Substance Use Topics  . Alcohol use: Not Currently    Alcohol/week: 0.0 standard drinks    Comment: 04/10/2016 "I'll have a drink on holidays/special occasions"    FAMILY HISTORY:   Family History  Problem Relation Age of Onset  . Stroke Mother   . Renal Disease Father     DRUG ALLERGIES:   Allergies  Allergen Reactions  . Ace Inhibitors     Cough  . Doxycycline Nausea Only  . Hydrocodone Itching and Nausea Only  . Mercury  Other reaction(s): Unknown  . Silver Dermatitis    Severe itching  . Cephalexin Rash  . Clindamycin/Lincomycin Rash  . Nickel Rash  . Penicillins Rash    Has patient had a PCN reaction causing immediate rash, facial/tongue/throat swelling, SOB or lightheadedness with hypotension: YES Has patient had a PCN reaction causing severe rash involving mucus membranes or skin necrosis: NO Has patient had a PCN reaction that required hospitalization NO Has patient had a PCN reaction occurring within the last 10 years: NO If all of the above answers are "NO", then may proceed with Cephalosporin use.    REVIEW OF SYSTEMS:   Review of Systems  Constitutional: Positive for malaise/fatigue. Negative for chills, diaphoresis, fever and weight loss.  HENT: Negative for congestion, sinus pain and sore throat.   Eyes: Negative for blurred vision, double vision and pain.  Respiratory: Positive for shortness  of breath (worse with exertion). Negative for cough, hemoptysis, sputum production and wheezing.   Cardiovascular: Positive for orthopnea and leg swelling. Negative for chest pain and palpitations.  Gastrointestinal: Negative for abdominal pain, blood in stool, constipation, diarrhea, heartburn, nausea and vomiting.  Genitourinary: Negative for dysuria, flank pain, frequency and hematuria.  Musculoskeletal: Negative for falls, joint pain and myalgias.  Skin: Negative for itching and rash.  Neurological: Negative for dizziness, loss of consciousness and weakness.  Psychiatric/Behavioral: Negative.       MEDICATIONS AT HOME:   Prior to Admission medications   Medication Sig Start Date End Date Taking? Authorizing Provider  acetaminophen (TYLENOL) 650 MG CR tablet Take 1 tablet (650 mg total) by mouth every 6 (six) hours as needed for pain (mild and moderate pain). 11/30/18   Gladstone Lighter, MD  albuterol (PROVENTIL) (2.5 MG/3ML) 0.083% nebulizer solution Take 3 mLs (2.5 mg total) by nebulization every 6 (six) hours as needed for wheezing or shortness of breath. 11/30/18   Gladstone Lighter, MD  ALPRAZolam Duanne Moron) 0.25 MG tablet Take 1 tablet (0.25 mg total) by mouth at bedtime as needed for sleep. 11/30/18   Gladstone Lighter, MD  amiodarone (PACERONE) 200 MG tablet Take 1 tablet (200 mg total) by mouth daily. 09/24/18   Minna Merritts, MD  Cholecalciferol (VITAMIN D) 2000 units tablet Take 2,000 Units by mouth daily.    [provider]  furosemide (LASIX) 40 MG tablet Take 1 tablet (40 mg total) by mouth 2 (two) times daily. 11/30/18   Gladstone Lighter, MD  Menthol (ICY HOT) 5 % PTCH Apply 1 patch topically daily as needed (pain).    [provider]  oxyCODONE (OXY IR/ROXICODONE) 5 MG immediate release tablet Take 0.5 tablets (2.5 mg total) by mouth every 6 (six) hours as needed for severe pain. 11/30/18   Gladstone Lighter, MD  polyethylene glycol (MIRALAX / GLYCOLAX) 17 g  packet Take 17 g by mouth daily. 11/30/18   Gladstone Lighter, MD  potassium chloride SA (K-DUR) 20 MEQ tablet Take 1 tablet (20 mEq total) by mouth daily. 11/30/18   Gladstone Lighter, MD  Probiotic Product (RISA-BID PROBIOTIC PO) Take 1 capsule by mouth daily.     [provider]  vitamin C (ASCORBIC ACID) 500 MG tablet Take 500 mg by mouth daily.    [provider]  warfarin (COUMADIN) 4 MG tablet Take 1 tablet (4 mg total) by mouth one time only at 6 PM. 11/30/18   Gladstone Lighter, MD  zinc sulfate 220 (50 Zn) MG capsule Take 1 capsule (220 mg total) by mouth daily. 12/01/18  Gladstone Lighter, MD      VITAL SIGNS:  Height 4\' 11"  (1.499 m), weight 66.5 kg.  PHYSICAL EXAMINATION:  Physical Exam Constitutional:      General: She is not in acute distress.    Appearance: Normal appearance. She is ill-appearing.  HENT:     Head: Normocephalic.     Right Ear: External ear normal.     Left Ear: External ear normal.     Nose: Nose normal.     Mouth/Throat:     Mouth: Mucous membranes are moist.     Pharynx: Oropharynx is clear.  Eyes:     General: No scleral icterus.    Extraocular Movements: Extraocular movements intact.     Conjunctiva/sclera: Conjunctivae normal.     Pupils: Pupils are equal, round, and reactive to light.  Neck:     Musculoskeletal: Normal range of motion.  Cardiovascular:     Rate and Rhythm: Normal rate and regular rhythm.     Pulses: Normal pulses.     Heart sounds: Murmur present. No friction rub. No gallop.   Pulmonary:     Effort: Pulmonary effort is normal. No respiratory distress.     Breath sounds: Rales (faint in LLL) present. No wheezing or rhonchi.  Abdominal:     General: Bowel sounds are normal. There is no distension.     Palpations: Abdomen is soft.     Tenderness: There is no abdominal tenderness. There is no guarding or rebound.  Musculoskeletal: Normal range of motion.        General: Tenderness present.     Right lower  leg: Edema (2+ pitting) present.     Left lower leg: Edema (edema above popliteal area with bandage below the knee) present.  Skin:    General: Skin is warm and dry.     Capillary Refill: Capillary refill takes less than 2 seconds.     Coloration: Skin is not jaundiced.     Findings: Bruising present. No rash.  Neurological:     Mental Status: She is alert and oriented to person, place, and time.     Motor: Weakness (general) present.  Psychiatric:        Mood and Affect: Mood normal.        Behavior: Behavior normal.       LABORATORY PANEL:   CBC Recent Labs  Lab 12/04/18 1954  WBC 5.5  HGB 9.2*  HCT 30.6*  PLT 206   ------------------------------------------------------------------------------------------------------------------  Chemistries  Recent Labs  Lab 12/04/18 1954  NA 139  K 4.0  CL 99  CO2 30  GLUCOSE 98  BUN 24*  CREATININE 1.23*  CALCIUM 8.9  MG 2.2  AST 20  ALT 15  ALKPHOS 89  BILITOT 0.9   ------------------------------------------------------------------------------------------------------------------  Cardiac Enzymes Recent Labs  Lab 12/04/18 1954  TROPONINI 0.05*   ------------------------------------------------------------------------------------------------------------------  RADIOLOGY:  No results found.    IMPRESSION AND PLAN:   1. acute exacerbation combined CHF - 40 mg IV Lasix twice daily - Telemetry monitoring - Continuous pulse oximetry - O2 per nasal cannula - DuoNeb every 6 hours as needed for shortness of breath - We will continue daily weights -Sodium restriction diet -Strict intake and output -Repeat chest x-ray in the a.m. -BMP and CBC in the a.m.  2.  Nonischemic cardiomyopathy - We will continue losartan.  Coreg and spironolactone were discontinued during her recent hospitalization secondary to hypotension and acute renal insufficiency. - Recent ejection fraction 25 to 30% on echocardiogram  11/05/2018.   3.  History of mitral valve replacement - Continue Coumadin with therapeutic INR today 3.1 - We will repeat INR in the a.m. -Telemetry monitoring  4.  Left lower extremity wound status post debridement of large hematoma - Wound care dressing is in place -We will consult wound care nurse for continued management --According to the patient's daughter, the graft has been healing well at the last visit  5.  Generalized weakness-physical therapy consulted for supportive care  6. chronic kidney disease --BUN is 24 with creatinine 1.23 - Continue to monitor renal function closely during diuresis    All the records are reviewed and case discussed with ED provider. The plan of care was discussed in details with the patient (and family). I answered all questions. The patient agreed to proceed with the above mentioned plan. Further management will depend upon hospital course.   CODE STATUS: Full code  TOTAL TIME TAKING CARE OF THIS PATIENT: 13minutes.    [facility] on 12/04/2018 at 9:26 PM  Pager - [phone]  After 6pm go to [email or web address] - Proofreader  [facility]  Office  [phone]  CC: Primary care physician; Rusty Aus, MD   Note: This dictation was prepared with Dragon dictation along with smaller phrase technology. Any transcriptional errors that result from this process are unintentional.

## 2018-12-04 NOTE — Telephone Encounter (Signed)
     I saw [name] in clinic today and sent her over for labs and CXR in the setting of ongoing dyspnea and hypoxia.  Labs below.  BNP significantly elevated.  Renal fxn/cbc stable.  INR Rx (mech MV).  ABG w/ mild metabolic alkalosis.  CXR w/ significant bilat airspace disease.  In that setting, I called her dtr who was present with her at today's visit, but her phone went to voice mail.  I then called her son, Helene Kelp, and was able to speak with him.  I advised that [name] present to the ED for eval, admission, and IV diuresis. With baseline hypoxia and markedly abnl cxr, I did not feel comfortable trying to manage her w/ oral diuretics @ rehab over the weekend. Son verbalized understanding and was texting his sister to let her know to take [name] to the ED.  Lab Results  Component Value Date   WBC 6.8 12/04/2018   HGB 9.2 (L) 12/04/2018   HCT 31.2 (L) 12/04/2018   MCV 89.9 12/04/2018   PLT 226 12/04/2018     Lab Results  Component Value Date   CREATININE 1.20 (H) 12/04/2018   BUN 25 (H) 12/04/2018   NA 141 12/04/2018   K 4.8 12/04/2018   CL 100 12/04/2018   CO2 30 12/04/2018   Lab Results  Component Value Date   ALT 15 12/04/2018   AST 21 12/04/2018   ALKPHOS 92 12/04/2018   BILITOT 1.0 12/04/2018   BNP 1189  Lab Results  Component Value Date   INR 3.1 (H) 12/04/2018   INR 2.3 (H) 11/30/2018   INR 2.3 (H) 11/29/2018    PH 7.47 PCO2 46 PO2 132 Bicarb 33.5 Base Ex 8.6 O2 sat 99.2  Murray Hodgkins, NP 12/04/2018, 5:43 PM

## 2018-12-04 NOTE — Telephone Encounter (Signed)
Patient has appointment today with provider. Closing encounter.

## 2018-12-05 ENCOUNTER — Inpatient Hospital Stay: Payer: Medicare Other

## 2018-12-05 DIAGNOSIS — I5043 Acute on chronic combined systolic (congestive) and diastolic (congestive) heart failure: Secondary | ICD-10-CM

## 2018-12-05 LAB — BASIC METABOLIC PANEL
Anion gap: 8 (ref 5–15)
BUN: 25 mg/dL — ABNORMAL HIGH (ref 8–23)
CO2: 31 mmol/L (ref 22–32)
Calcium: 8.7 mg/dL — ABNORMAL LOW (ref 8.9–10.3)
Chloride: 100 mmol/L (ref 98–111)
Creatinine, Ser: 1.19 mg/dL — ABNORMAL HIGH (ref 0.44–1.00)
GFR calc Af Amer: 50 mL/min — ABNORMAL LOW (ref >60)
GFR calc non Af Amer: 43 mL/min — ABNORMAL LOW (ref >60)
Glucose, Bld: 88 mg/dL (ref 70–99)
Potassium: 3.7 mmol/L (ref 3.5–5.1)
Sodium: 139 mmol/L (ref 135–145)

## 2018-12-05 LAB — TROPONIN I
Troponin I: 0.04 ng/mL (ref <0.03)
Troponin I: 0.04 ng/mL (ref <0.03)

## 2018-12-05 LAB — PROTIME-INR
INR: 3.6 — ABNORMAL HIGH (ref 0.8–1.2)
INR: 3.5 — ABNORMAL HIGH (ref 0.8–1.2)
Prothrombin Time: 35 seconds — ABNORMAL HIGH (ref 11.4–15.2)
Prothrombin Time: 34.9 seconds — ABNORMAL HIGH (ref 11.4–15.2)

## 2018-12-05 LAB — PROCALCITONIN: Procalcitonin: <0.1 ng/mL

## 2018-12-05 MED ORDER — FUROSEMIDE 10 MG/ML IJ SOLN
20.0000 mg | Freq: Once | INTRAMUSCULAR | Status: AC
  Administered 2018-12-05: 20 mg via INTRAVENOUS
  Filled 2018-12-05: qty 2

## 2018-12-05 MED ORDER — FUROSEMIDE 10 MG/ML IJ SOLN
80.0000 mg | Freq: Two times a day (BID) | INTRAMUSCULAR | Status: DC
  Administered 2018-12-06 – 2018-12-08 (×4): 80 mg via INTRAVENOUS
  Filled 2018-12-05 (×5): qty 8

## 2018-12-05 NOTE — Progress Notes (Addendum)
[place] at [place] NAME: [name]    MR#:  [id number]  DATE OF BIRTH:  [birth year]  SUBJECTIVE:  Patient is seen at the bedside.  She is able to get up to the bedside commode with 1 person assist without significant shortness of breath.  She denies any worsening symptoms or new symptoms.  Significant events reported overnight.  REVIEW OF SYSTEMS:  Review of Systems  Constitutional: Positive for malaise/fatigue. Negative for chills, fever and weight loss.  HENT: Negative for congestion, hearing loss and sore throat.   Eyes: Negative for blurred vision and double vision.  Respiratory: Positive for shortness of breath. Negative for cough and wheezing.   Cardiovascular: Positive for orthopnea and leg swelling. Negative for chest pain and palpitations.  Gastrointestinal: Negative for abdominal pain, diarrhea, nausea and vomiting.  Genitourinary: Negative for dysuria and urgency.  Musculoskeletal: Negative for myalgias.  Skin: Negative for rash.  Neurological: Negative for dizziness, sensory change, speech change, focal weakness and headaches.  Psychiatric/Behavioral: Negative for depression.   DRUG ALLERGIES:   Allergies  Allergen Reactions   Ace Inhibitors     Cough   Doxycycline Nausea Only   Hydrocodone Itching and Nausea Only   Mercury     Other reaction(s): Unknown   Silver Dermatitis    Severe itching   Cephalexin Rash   Clindamycin/Lincomycin Rash   Nickel Rash   Penicillins Rash    Has patient had a PCN reaction causing immediate rash, facial/tongue/throat swelling, SOB or lightheadedness with hypotension: YES Has patient had a PCN reaction causing severe rash involving mucus membranes or skin necrosis: NO Has patient had a PCN reaction that required hospitalization NO Has patient had a PCN reaction occurring within the last 10 years: NO If all of the above answers are "NO", then may proceed with  Cephalosporin use.   VITALS:  Blood pressure 122/63, pulse 61, temperature 97.8 F (36.6 C), temperature source Oral, resp. rate 20, height 4\' 11"  (1.499 m), weight 65.7 kg, SpO2 100 %. PHYSICAL EXAMINATION:   GENERAL:  84 y.o.-year-old patient lying in the bed with no acute distress.  EYES: Pupils equal, round, reactive to light and accommodation. No scleral icterus. Extraocular muscles intact.  HEENT: Head atraumatic, normocephalic. Oropharynx and nasopharynx clear.  NECK:  Supple, no jugular venous distention. No thyroid enlargement, no tenderness.  LUNGS: Normal breath sounds bilaterally, no wheezing, rales,rhonchi or crepitation. No use of accessory muscles of respiration.  CARDIOVASCULAR: S1, S2 normal. No murmurs, rubs, or gallops.  ABDOMEN: Soft, nontender, nondistended. Bowel sounds present. No organomegaly or mass.  EXTREMITIES: Bilateral pitting edema of lower extremities.  Ace wrap to left leg, cyanosis, or clubbing. No rash or lesions. + pedal pulses MUSCULOSKELETAL: Normal bulk, and power was 5+ grip and elbow, knee, and ankle flexion and extension bilaterally.  NEUROLOGIC:Alert and oriented x 3. CN 2-12 intact. Sensation to light touch and cold stimuli intact bilaterally. Finger to nose nl. Babinski is downgoing. DTR's (biceps, patellar, and achilles) 2+ and symmetric throughout. Gait not tested due to safety concern. PSYCHIATRIC: The patient is alert and oriented x 3.  SKIN: No obvious rash, lesion, or ulcer.   DATA REVIEWED:  LABORATORY PANEL:  Female CBC Recent Labs  Lab 12/04/18 1954  WBC 5.5  HGB 9.2*  HCT 30.6*  PLT 206   ------------------------------------------------------------------------------------------------------------------ Chemistries  Recent Labs  Lab 12/04/18 1954 12/05/18 0258  NA 139 139  K 4.0 3.7  CL 99 100  CO2 30 31  GLUCOSE 98 88  BUN 24* 25*  CREATININE 1.23* 1.19*  CALCIUM 8.9 8.7*  MG 2.2  --   AST 20  --   ALT 15  --     ALKPHOS 89  --   BILITOT 0.9  --    RADIOLOGY:  Dg Chest Port 1 View  Result Date: 12/05/2018 CLINICAL DATA:  Congestive heart failure. EXAM: PORTABLE CHEST 1 VIEW COMPARISON:  11/13/2018 FINDINGS: There is a new airspace opacity that appears to be centered within the right middle lobe. The cardiac size is enlarged. The lung volumes are low. There are additional hazy airspace opacities bilaterally with prominent interstitial lung markings. There is a multi lead left-sided pacemaker in place. The patient is status post prior mitral valve replacement. There are probable small bilateral pleural effusions. The patient appears to be status post multilevel vertebral augmentation. IMPRESSION: 1. Airspace opacity in the right middle lobe concerning for a developing infiltrate or atelectasis. 2. Cardiomegaly with volume overload and small bilateral pleural effusions. Electronically Signed   By: Constance Holster M.D.   On: 12/05/2018 03:24   ASSESSMENT AND PLAN:   84 y.o. female with past medical history of persistent atrial fibrillation on Coumadin, chronic systolic heart failure status post CRT, mechanical MVR, mild aortic stenosis, CKD, COPD with chronic respiratory failure on home oxygen presenting as a direct admission from a cardiologist Dr. Rockey Situ office for acute exacerbation of combined CHF.  1. 1. Acute on chronic systolic and diastolic Congestive Heart Failure: Acute presentation likely due to volume overload with associated symptoms of SOB, BLE edema. BNP elevated at 992  Last Echo 5/20 with  LV severely reduced with EF 25-30% - Holding CHF meds (carvedilol, losartan and spironolactone due to hypotension in the setting of anemia and AKI)per cardiology - Diuretics: Furosemide 40mg  IV BID. Diureses >1L negative per day until approach euvolemia / worsening renal function. - Low salt diet  - Check daily weight - Strict I&Os - PT/OT consult - Cardiology input appreciated, will continue to follow  with them  2. Atrial tachycardia/paroxysmal atrial fibrillation -AV paced - Continue anticoagulation on Coumadin - Coumadin management per pharmacy - Continue amiodarone for rate control. *Follow INR  3. COPD-  No signs of exacerbation - patient with hx of COPD uses home oxygen  - Supplemental O2, goal sat 88-92% - Bronchodilators (albuterol/ipratropium) standing and PRN   4. Left lower extremity wound status post debridement of large hematoma - Wound care to follow - Plastics and surgery consult  5.  Elevated troponin -likely demand ischemia -Trending troponin   6. HTN- stable + Goal BP <140/90 - Beta-blocker: Metoprolol on hold per cardiology - ACE-Inhibitor: Resume as BP allows   7. CKD -renal functions appear to be at baseline - Continue to monitor and avoid nephrotoxins  8. DVT prophylaxis - Therapeutically anti-coagulated with warfarin   All the records are reviewed and case discussed with Care Management/Social Worker. Management plans discussed with the patient, family and they are in agreement.  CODE STATUS: Full Code  TOTAL TIME TAKING CARE OF THIS PATIENT: 39 minutes.   More than 50% of the time was spent in counseling/coordination of care: YES  POSSIBLE D/C IN 1 DAYS, DEPENDING ON CLINICAL CONDITION.   on 12/05/2018 at 1:32 PM  This patient was staffed with Dr. Valetta Fuller, [facility] who personally evaluated patient, reviewed documentation and agreed with assessment and plan of care as above.  Rufina Falco, DNP, FNP-BC [facility] Nurse Practitioner  Between 7am to 6pm - Pager - [phone]  After 6pm go to [email or web address] - Proofreader  [facility]  Office  [phone]  CC: Primary care physician; Rusty Aus, MD  Note: This dictation was prepared with Dragon dictation along with smaller phrase technology. Any transcriptional errors that result from this process are unintentional.

## 2018-12-05 NOTE — Consult Note (Signed)
Cardiology Consultation:   Patient ID: [name] MRN: [id number]; DOB: 04-04-1935  Admit date: 12/04/2018 Date of Consult: 12/05/2018  Primary Care Provider: Rusty Aus, MD Primary Cardiologist: Ida Rogue, MD  Primary Electrophysiologist:  None    Patient Profile:   [name] is a 84 y.o. female with a hx of persistent atrial fibrillation, chronic systolic heart failure status post CRT, mechanical MVR, mild aortic stenosis, CKD, COPD with chronic respiratory failure on home oxygen who is being seen today for the evaluation of heart failure at the request of [employer].  History of Present Illness:   [name] is being referred for evaluation of heart failure.  She has had multiple admissions over the last month.  She has been having increasing lower extremity edema and shortness of breath with minimal exertion and at rest.  Her shortness of breath also occurs when lying flat.  Her Lasix dose has been decreased to 40 mg twice a day from 80 mg twice a day.  This was due to hypotension and mild renal insufficiency.  She does have orthopnea, but no PND.  She is on oxygen at home at 2 to 3 L.  Her BNP was found to be significantly elevated with stable renal function.  Her chest x-ray showed bilateral airspace disease.  She has had multiple hospitalizations for heart failure in the past.  She has had an atrial tachycardia and had a cardioversion 11/06/18.  She also has had a calf hematoma that was thought due to her Coumadin.  Past Medical History:  Diagnosis Date  . Arthritis    "back, hands, knees" (04/10/2016)  . Chronic back pain   . 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 EF 25% by echo in 08/2015 b. RHC in 08/2015 showed normal filling pressures; c. 10/2018 Echo (in setting of atrial tachycardia): EF 25-30%, impaired relaxation. Glob HK. RVSP 34mmHg. Mod dil RA/LA. Mech MV w/ nl fxn (mean grad 53mmHg). Mod TR.    [name] Chronic kidney disease   . Compression fracture    "several; all in my back" (04/10/2016)  . GERD (gastroesophageal reflux disease)   . Hypertension   . Lymphedema   . Pacemaker 05/2017  . PAF (paroxysmal atrial fibrillation) ([employer])    a. On amio & Coumadin - CHA2DS2VASc = 4.  . S/P MVR (mitral valve replacement)    a. MVR 1994 b. redo MVR in 08/2014 - on Coumadin  . Shortness of breath dyspnea     Past Surgical History:  Procedure Laterality Date  . BREAST LUMPECTOMY Right 2005?   benign lump excision  . CARDIAC CATHETERIZATION    . [place], 2016   "MVR; MVR"  . CARDIOVERSION N/A 11/06/2018   Procedure: CARDIOVERSION;  Surgeon: Minna Merritts, MD;  Location: [facility];  Service: Cardiovascular;  Laterality: N/A;  . CATARACT EXTRACTION W/PHACO Right 02/20/2015   Procedure: CATARACT EXTRACTION PHACO AND INTRAOCULAR LENS PLACEMENT ([employer]);  Surgeon: Estill Cotta, MD;  Location: [facility];  Service: Ophthalmology;  Laterality: Right;  US01:31.2 AP 25.3% CDE40.13 Fluid lot # [id number]  . DRESSING CHANGE UNDER ANESTHESIA Left 11/17/2018   Procedure: DRESSING CHANGE;  Surgeon: Robert Bellow, MD;  Location: [facility];  Service: General;  Laterality: Left;  no anesthesia  . ELECTROPHYSIOLOGIC STUDY N/A 10/09/2015   Procedure: CARDIOVERSION;  Surgeon: Wellington Hampshire, MD;  Location: [facility];  Service: Cardiovascular;  Laterality: N/A;  . INCISION  AND DRAINAGE OF WOUND Left 11/13/2018   Procedure: LEFT LEG DRESSING CHANGE;  Surgeon: Robert Bellow, MD;  Location: [facility];  Service: General;  Laterality: Left;  . INCISION AND DRAINAGE OF WOUND Left 11/19/2018   Procedure: LEFT LEG DRESSING CHANGE AND GRAFT APPLICATION;  Surgeon: Robert Bellow, MD;  Location: [facility];  Service: General;  Laterality: Left;  . IR GENERIC HISTORICAL  04/01/2016   IR RADIOLOGIST EVAL & MGMT 04/01/2016 [facility]  . IR GENERIC HISTORICAL  04/15/2016   IR VERTEBROPLASTY  CERV/THOR BX INC UNI/BIL INC/INJECT/IMAGING 04/15/2016 Luanne Bras, MD [facility]  . IR GENERIC HISTORICAL  04/15/2016   IR VERTEBROPLASTY EA ADDL (T&LS) BX INC UNI/BIL INC INJECT/IMAGING 04/15/2016 Luanne Bras, MD [facility]  . IR GENERIC HISTORICAL  04/15/2016   IR VERTEBROPLASTY EA ADDL (T&LS) BX INC UNI/BIL INC INJECT/IMAGING 04/15/2016 Luanne Bras, MD [facility]  . IR GENERIC HISTORICAL  05/20/2016   IR RADIOLOGIST EVAL & MGMT 05/20/2016 [facility]  . IR GENERIC HISTORICAL  06/13/2016   IR VERTEBROPLASTY EA ADDL (T&LS) BX INC UNI/BIL INC INJECT/IMAGING 06/13/2016 Luanne Bras, MD [facility]  . IR GENERIC HISTORICAL  06/13/2016   IR SACROPLASTY BILATERAL 06/13/2016 Luanne Bras, MD [facility]  . IR GENERIC HISTORICAL  06/13/2016   IR VERTEBROPLASTY LUMBAR BX INC UNI/BIL INC/INJECT/IMAGING 06/13/2016 Luanne Bras, MD [facility]  . IRRIGATION AND DEBRIDEMENT HEMATOMA Left 07/05/2015   Procedure: IRRIGATION AND DEBRIDEMENT HEMATOMA;  Surgeon: Robert Bellow, MD;  Location: [facility];  Service: General;  Laterality: Left;  . IRRIGATION AND DEBRIDEMENT HEMATOMA Left 11/11/2018   Procedure: IRRIGATION AND DEBRIDEMENT LEFT LEG HEMATOMA;  Surgeon: Robert Bellow, MD;  Location: [facility];  Service: General;  Laterality: Left;  . pyloric stenosis  [birth year]/1937  . US ECHOCARDIOGRAPHY       Home Medications:  Prior to Admission medications   Medication Sig Start Date End Date Taking? Authorizing Provider  acetaminophen (TYLENOL) 650 MG CR tablet Take 1 tablet (650 mg total) by mouth every 6 (six) hours as needed for pain (mild and moderate pain). 11/30/18   Gladstone Lighter, MD  albuterol (PROVENTIL) (2.5 MG/3ML) 0.083% nebulizer solution Take 3 mLs (2.5 mg total) by nebulization every 6 (six) hours as needed for wheezing or shortness of breath. 11/30/18   Gladstone Lighter, MD  ALPRAZolam Duanne Moron) 0.25 MG tablet Take 1 tablet (0.25 mg total) by  mouth at bedtime as needed for sleep. 11/30/18   Gladstone Lighter, MD  amiodarone (PACERONE) 200 MG tablet Take 1 tablet (200 mg total) by mouth daily. 09/24/18   Minna Merritts, MD  Cholecalciferol (VITAMIN D) 2000 units tablet Take 2,000 Units by mouth daily.    [provider]  furosemide (LASIX) 40 MG tablet Take 1 tablet (40 mg total) by mouth 2 (two) times daily. 11/30/18   Gladstone Lighter, MD  Menthol (ICY HOT) 5 % PTCH Apply 1 patch topically daily as needed (pain).    [provider]  oxyCODONE (OXY IR/ROXICODONE) 5 MG immediate release tablet Take 0.5 tablets (2.5 mg total) by mouth every 6 (six) hours as needed for severe pain. 11/30/18   Gladstone Lighter, MD  polyethylene glycol (MIRALAX / GLYCOLAX) 17 g packet Take 17 g by mouth daily. 11/30/18   Gladstone Lighter, MD  potassium chloride SA (K-DUR) 20 MEQ tablet Take 1 tablet (20 mEq total) by mouth daily. 11/30/18   Gladstone Lighter, MD  Probiotic Product (RISA-BID PROBIOTIC PO) Take 1 capsule by  mouth daily.     [provider]  vitamin C (ASCORBIC ACID) 500 MG tablet Take 500 mg by mouth daily.    [provider]  warfarin (COUMADIN) 4 MG tablet Take 1 tablet (4 mg total) by mouth one time only at 6 PM. 11/30/18   Gladstone Lighter, MD  zinc sulfate 220 (50 Zn) MG capsule Take 1 capsule (220 mg total) by mouth daily. 12/01/18   Gladstone Lighter, MD    Inpatient Medications: Scheduled Meds: . amiodarone  200 mg Oral Daily  . cholecalciferol  2,000 Units Oral Daily  . furosemide  40 mg Intravenous Q12H  . polyethylene glycol  17 g Oral Daily  . potassium chloride SA  20 mEq Oral Daily  . sodium chloride flush  3 mL Intravenous Q12H  . vitamin C  500 mg Oral Daily  . zinc sulfate  220 mg Oral Daily   Continuous Infusions: . sodium chloride     PRN Meds: sodium chloride, acetaminophen, albuterol, ALPRAZolam, ipratropium-albuterol, ondansetron (ZOFRAN) IV, sodium chloride flush   Allergies:    Allergies  Allergen Reactions  . Ace Inhibitors     Cough  . Doxycycline Nausea Only  . Hydrocodone Itching and Nausea Only  . Mercury     Other reaction(s): Unknown  . Silver Dermatitis    Severe itching  . Cephalexin Rash  . Clindamycin/Lincomycin Rash  . Nickel Rash  . Penicillins Rash    Has patient had a PCN reaction causing immediate rash, facial/tongue/throat swelling, SOB or lightheadedness with hypotension: YES Has patient had a PCN reaction causing severe rash involving mucus membranes or skin necrosis: NO Has patient had a PCN reaction that required hospitalization NO Has patient had a PCN reaction occurring within the last 10 years: NO If all of the above answers are "NO", then may proceed with Cephalosporin use.    Social History:   Social History   Socioeconomic History  . Marital status: Widowed    Spouse name: Not on file  . Number of children: Not on file  . Years of education: Not on file  . Highest education level: Not on file  Occupational History  . Not on file  Social Needs  . Financial resource strain: Not on file  . Food insecurity:    Worry: Not on file    Inability: Not on file  . Transportation needs:    Medical: Not on file    Non-medical: Not on file  Tobacco Use  . Smoking status: Former Smoker    Packs/day: 1.00    Years: 27.00    Pack years: 27.00    Types: Cigarettes    Last attempt to quit: 07/01/1980    Years since quitting: 38.4  . Smokeless tobacco: Never Used  . Tobacco comment: "quit smoking ~ 1980  Substance and Sexual Activity  . Alcohol use: Not Currently    Alcohol/week: 0.0 standard drinks    Comment: 04/10/2016 "I'll have a drink on holidays/special occasions"  . Drug use: No  . Sexual activity: Never  Lifestyle  . Physical activity:    Days per week: Not on file    Minutes per session: Not on file  . Stress: Not on file  Relationships  . Social connections:    Talks on phone: Not on file     Gets together: Not on file    Attends religious service: Not on file    Active member of club or organization: Not on file  Attends meetings of clubs or organizations: Not on file    Relationship status: Not on file  . Intimate partner violence:    Fear of current or ex partner: Not on file    Emotionally abused: Not on file    Physically abused: Not on file    Forced sexual activity: Not on file  Other Topics Concern  . Not on file  Social History Narrative  . Not on file    Family History:    Family History  Problem Relation Age of Onset  . Stroke Mother   . Renal Disease Father      ROS:  Please see the history of present illness.   All other ROS reviewed and negative.     Physical Exam/Data:   Vitals:   12/04/18 2012 12/05/18 0324 12/05/18 0833  BP:  (!) 116/54 122/63  Pulse:  64 61  Resp:   20  Temp:  97.8 F (36.6 C) 97.8 F (36.6 C)  TempSrc:  Oral Oral  SpO2:  92% 100%  Weight: 66.5 kg 65.7 kg   Height: 4\' 11"  (1.499 m)      Intake/Output Summary (Last 24 hours) at 12/05/2018 1056 Last data filed at 12/05/2018 1004 Gross per 24 hour  Intake 480 ml  Output 300 ml  Net 180 ml   Last 3 Weights 12/05/2018 12/04/2018 12/04/2018  Weight (lbs) 144 lb 14.4 oz 146 lb 9.6 oz 136 lb  Weight (kg) 65.726 kg 66.497 kg 61.689 kg  Some encounter information is confidential and restricted. Go to Review Flowsheets activity to see all data.     Body mass index is 29.27 kg/m.  General:  Well nourished, well developed, in no acute distress HEENT: normal Lymph: no adenopathy Neck: no JVD Endocrine:  No thryomegaly Vascular: No carotid bruits; FA pulses 2+ bilaterally without bruits  Cardiac:  normal S1, S2; RRR; no murmur  Lungs:  clear to auscultation bilaterally, no wheezing, rhonchi or rales  Abd: soft, nontender, no hepatomegaly  Ext: 1+ edema Musculoskeletal:  No deformities, BUE and BLE strength normal and equal Skin: warm and dry  Neuro:  CNs 2-12 intact, no  focal abnormalities noted Psych:  Normal affect   EKG:  The EKG was personally reviewed and demonstrates: A paced V pace Telemetry:  Telemetry was personally reviewed and demonstrates: A paced V pace  Relevant CV Studies: TTE 11/05/18  1. The left ventricle has severely reduced systolic function, with an ejection fraction of 25-30%. The cavity size was mildly dilated. Left ventricular diastolic Doppler parameters are consistent with impaired relaxation. Left ventrical global  hypokinesis without regional wall motion abnormalities.  2. The right ventricle has mildly reduced systolic function. The cavity was moderately enlarged. There is no increase in right ventricular wall thickness. Right ventricular systolic pressure is moderately elevated with an estimated pressure of 53 mmHg.  3. Right atrial size was moderately dilated.  4. Left atrial size was moderately dilated.  5. The aortic valve was not well visualized. Moderate calcification of the aortic valve. Aortic valve regurgitation was not assessed by color flow Doppler. Unable to estimate adegree of aortic valve stenosis given severely reduced systolic function.  6. Mechanical mitral valve. Mean gradient 5 mm Hg.  7. Tricuspid valve regurgitation is moderate.  8. Left pleural effusion noted, 6.5 cm  9. Rhythm is atrial tachycardia, rate 105 bpm  Laboratory Data:  Chemistry Recent Labs  Lab 12/04/18 1627 12/04/18 1954 12/05/18 0258  NA 141 139 139  K 4.8 4.0 3.7  CL 100 99 100  CO2 30 30 31   GLUCOSE 94 98 88  BUN 25* 24* 25*  CREATININE 1.20* 1.23* 1.19*  CALCIUM 9.2 8.9 8.7*  GFRNONAA 42* 41* 43*  GFRAA 49* 48* 50*  ANIONGAP 11 10 8     Recent Labs  Lab 12/04/18 1627 12/04/18 1954  PROT 6.6 6.2*  ALBUMIN 3.7 3.5  AST 21 20  ALT 15 15  ALKPHOS 92 89  BILITOT 1.0 0.9   Hematology Recent Labs  Lab 11/29/18 0526 12/04/18 1627 12/04/18 1954  WBC 4.1 6.8 5.5  RBC 3.22* 3.47* 3.44*  HGB 8.8* 9.2* 9.2*  HCT 29.2*  31.2* 30.6*  MCV 90.7 89.9 89.0  MCH 27.3 26.5 26.7  MCHC 30.1 29.5* 30.1  RDW 16.2* 16.4* 16.2*  PLT 171 226 206   Cardiac Enzymes Recent Labs  Lab 12/04/18 1954 12/05/18 0258 12/05/18 0902  TROPONINI 0.05* 0.04* 0.04*   No results for input(s): TROPIPOC in the last 168 hours.  BNP Recent Labs  Lab 12/04/18 1627 12/04/18 1954  BNP 1,189.0* 992.0*    DDimer No results for input(s): DDIMER in the last 168 hours.  Radiology/Studies:  Dg Chest Port 1 View  Result Date: 12/05/2018 CLINICAL DATA:  Congestive heart failure. EXAM: PORTABLE CHEST 1 VIEW COMPARISON:  11/13/2018 FINDINGS: There is a new airspace opacity that appears to be centered within the right middle lobe. The cardiac size is enlarged. The lung volumes are low. There are additional hazy airspace opacities bilaterally with prominent interstitial lung markings. There is a multi lead left-sided pacemaker in place. The patient is status post prior mitral valve replacement. There are probable small bilateral pleural effusions. The patient appears to be status post multilevel vertebral augmentation. IMPRESSION: 1. Airspace opacity in the right middle lobe concerning for a developing infiltrate or atelectasis. 2. Cardiomegaly with volume overload and small bilateral pleural effusions. Electronically Signed   By: Constance Holster M.D.   On: 12/05/2018 03:24    Assessment and Plan:   1. Chronic systolic and diastolic heart failure: Has significant lower extremity edema, edema on x-ray, and an elevated BNP.  We Aya Geisel continue her IV diuresis.  Hayes Rehfeldt hold heart failure medications at this time.  Saved 40 mg of IV Lasix this morning.  If she does not have much in the way of urine output, would increase this to 80 mg at her next dose. 2. Paroxysmal atrial fibrillation/atrial tachycardia: Currently AV paced.  INR supratherapeutic.  Warfarin per pharmacy recommendations 3. History of mechanical MVR: On warfarin per pharmacy  recommendations       For questions or updates, please contact [place] Please consult [email or web address] for contact info under     Signed, Keilen Kahl Meredith Leeds, MD  12/05/2018 10:56 AM

## 2018-12-05 NOTE — Evaluation (Addendum)
Physical Therapy Evaluation Patient Details Name: [name] MRN: [id number] DOB: 1934/08/05 Today's Date: 12/05/2018   History of Present Illness  [name] is an 75yoF who comes to [facility] from cardiology after a/c exacerbation of sCHF. Pt has been at [facility] for <1W s/p DC from this site for a similar problem. PMH: AF, chronic back pain, combined CHF, CKD, compression fracture of spine, PPM, MR, pulmonary HTN, LEE, chronic O2 use recently 4-6L.   Clinical Impression  Pt admitted with above diagnosis. Pt currently with functional limitations due to the deficits listed below (see "PT Problem List"). Upon entry, pt in bed, awake and agreeable to participate. Pt quite weak from frequent SOB. ModA to rise from EOB with RW. AMB around bed with resultant drop to 80% on room air. Pt would need much higher flow rate to perform basic AMB required for ADL. Pt would benefit greatly from completing the STR process at DC. Pt will benefit from skilled PT intervention to increase independence and safety with basic mobility in preparation for discharge to the venue listed below.       Follow Up Recommendations SNF(return to [facility] so she can complete her STR stay)    Equipment Recommendations  None recommended by PT    Recommendations for Other Services       Precautions / Restrictions Precautions Precautions: Fall Precaution Comments: Per Dr Bary Castilla, pt has no weight bearing precautions Restrictions Weight Bearing Restrictions: No Other Position/Activity Restrictions: Per Dr. Bary Castilla, pt has no weight bearing precautions      Mobility  Bed Mobility Overal bed mobility: Needs Assistance Bed Mobility: Supine to Sit       Sit to supine: Supervision      Transfers Overall transfer level: Needs assistance Equipment used: Rolling walker (2 wheeled) Transfers: Sit to/from Stand Sit to Stand: Mod assist;From elevated surface         General transfer comment:  posteiror lean   Ambulation/Gait Ambulation/Gait assistance: Min guard Gait Distance (Feet): 16 Feet(around bed to chair) Assistive device: Rolling walker (2 wheeled)       General Gait Details: on 4L/min O2, refuses to door as that is too far; once seated SpO2: 80%.   Stairs            Wheelchair Mobility    Modified Rankin (Stroke Patients Only)       Balance Overall balance assessment: Needs assistance;Mild deficits observed, not formally tested                                           Pertinent Vitals/Pain Pain Assessment: Faces Faces Pain Scale: Hurts a little bit Pain Location: Shoulder and LLE hemotoma site Pain Intervention(s): Limited activity within patient's tolerance;Monitored during session;Premedicated before session    Home Living Family/patient expects to be discharged to:: Skilled nursing facility(return to STR at DC ) Living Arrangements: Children(DTR) Available Help at Discharge: Family;Available PRN/intermittently;Personal care attendant   Home Access: Ramped entrance;Stairs to enter     Home Layout: Two level;1/2 bath on main level Home Equipment: Walker - 4 wheels;Toilet riser;Grab bars - toilet;Grab bars - tub/shower;Shower seat - built in      Prior Function Level of Independence: Needs assistance   Gait / Transfers Assistance Needed: uses rollator at baseline for household mobility  ADL's / Homemaking Assistance Needed: an aide comes 5x a week, 8-12 to  assist with ADLs/IADLs  Comments: denies falls in the last 6 months     Hand Dominance   Dominant Hand: Right    Extremity/Trunk Assessment                Communication   Communication: No difficulties  Cognition Arousal/Alertness: Awake/alert Behavior During Therapy: Flat affect;Anxious Overall Cognitive Status: Within Functional Limits for tasks assessed                                 General Comments: easily distracted.        General Comments      Exercises     Assessment/Plan    PT Assessment Patient needs continued PT services  PT Problem List Decreased strength;Decreased range of motion;Decreased activity tolerance;Decreased balance;Decreased mobility;Decreased knowledge of use of DME;Decreased knowledge of precautions;Decreased skin integrity;Pain       PT Treatment Interventions DME instruction;Gait training;Functional mobility training;Therapeutic activities;Therapeutic exercise;Balance training;Patient/family education    PT Goals (Current goals can be found in the Care Plan section)  Acute Rehab PT Goals Patient Stated Goal: breathing be easier, less limiting  PT Goal Formulation: With patient Time For Goal Achievement: 12/19/18 Potential to Achieve Goals: Fair    Frequency Min 2X/week   Barriers to discharge        Co-evaluation               AM-PAC PT "6 Clicks" Mobility  Outcome Measure Help needed turning from your back to your side while in a flat bed without using bedrails?: A Little Help needed moving from lying on your back to sitting on the side of a flat bed without using bedrails?: A Little Help needed moving to and from a bed to a chair (including a wheelchair)?: A Lot Help needed standing up from a chair using your arms (e.g., wheelchair or bedside chair)?: A Lot Help needed to walk in hospital room?: A Little Help needed climbing 3-5 steps with a railing? : A Little 6 Click Score: 16    End of Session Equipment Utilized During Treatment: Gait belt;Oxygen Activity Tolerance: Patient tolerated treatment well;Patient limited by fatigue;Treatment limited secondary to medical complications (Comment);No increased pain(SpO2 drop to 805 on 4L/min) Patient left: with call bell/phone within reach;in chair;with chair alarm set Nurse Communication: Mobility status PT Visit Diagnosis: Other abnormalities of gait and mobility (R26.89);Muscle weakness (generalized)  (M62.81);Difficulty in walking, not elsewhere classified (R26.2);Pain    Time: 1201-1219 PT Time Calculation (min) (ACUTE ONLY): 18 min   Charges:   PT Evaluation $PT Eval Moderate Complexity: 1 Mod          4:22 PM, 12/05/18 Etta Grandchild, PT, DPT Physical Therapist - [facility]  [phone] ([place])    [place] C 12/05/2018, 4:21 PM

## 2018-12-05 NOTE — Consult Note (Signed)
[place] for warfarin dose management Indication: atrial fibrillation  Patient Measurements: Height: 4\' 11"  (149.9 cm) Weight: 144 lb 14.4 oz (65.7 kg) IBW/kg (Calculated) : 43.2  Vital Signs: Temp: 97.8 F (36.6 C) (06/06 0324) Temp Source: Oral (06/06 0324) BP: 116/54 (06/06 0324) Pulse Rate: 64 (06/06 0324)  Labs: Recent Labs    12/04/18 1627 12/04/18 1954 12/05/18 0258  HGB 9.2* 9.2*  --   HCT 31.2* 30.6*  --   PLT 226 206  --   APTT  --  47*  --   LABPROT 31.3*  --  35.0*  INR 3.1*  --  3.6*  CREATININE 1.20* 1.23* 1.19*  TROPONINI  --  0.05* 0.04*    Estimated Creatinine Clearance: 30.6 mL/min (A) (by C-G formula based on SCr of 1.19 mg/dL (H)).   Medical History: Past Medical History:  Diagnosis Date  . Arthritis    "back, hands, knees" (04/10/2016)  . Chronic back pain   . 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 EF 25% by echo in 08/2015 b. RHC in 08/2015 showed normal filling pressures; c. 10/2018 Echo (in setting of atrial tachycardia): EF 25-30%, impaired relaxation. Glob HK. RVSP 74mmHg. Mod dil RA/LA. Mech MV w/ nl fxn (mean grad 7mmHg). Mod TR.   Marland Kitchen Chronic kidney disease   . Compression fracture    "several; all in my back" (04/10/2016)  . GERD (gastroesophageal reflux disease)   . Hypertension   . Lymphedema   . Pacemaker 05/2017  . PAF (paroxysmal atrial fibrillation) ([place])    a. On amio & Coumadin - CHA2DS2VASc = 4.  . S/P MVR (mitral valve replacement)    a. MVR 1994 b. redo MVR in 08/2014 - on Coumadin  . Shortness of breath dyspnea     Medications:  Scheduled:  . amiodarone  200 mg Oral Daily  . cholecalciferol  2,000 Units Oral Daily  . furosemide  40 mg Intravenous Q12H  . polyethylene glycol  17 g Oral Daily  . potassium chloride SA  20 mEq Oral Daily  . sodium chloride flush  3 mL Intravenous Q12H  . vitamin C  500 mg Oral Daily  .  warfarin  4 mg Oral ONCE-1800  . zinc sulfate  220 mg Oral Daily    Assessment: Pt was on warfarin previously for atrial fibrillation with mechanical mitral valve. On admission patient's INR was 3.1 (up to 3.6 today). Pt is on amiodarone which can increase INR. At home patient takes 1 tablet (6mg ) by mouth every Monday, Wednesday, Friday evening and take  tablet (3mg ) by mouth every Tuesday, Thursday, Saturday and Sunday evening. DDI  Goal of Therapy:  INR 2-3 given recent bleed (per cardiology on previous visit, even with valve). Monitor platelets by anticoagulation protocol: Yes   Plan:  --INR is supratherapeutic: hold dose today and repeat INR in am --DDI with amiodarone (home med)  Dallie Piles, PharmD 12/05/2018,8:11 AM

## 2018-12-05 NOTE — Progress Notes (Signed)
Notified NP of pt's low blood pressure, 116/58. Order to give 80mg  IV Lasix. This Rn did not feel comfortable giving 80. NP stated that she did not feel comfortable giving 80 with the blood pressure. New order placed. Will continue to monitor and assess.

## 2018-12-06 ENCOUNTER — Inpatient Hospital Stay: Payer: Medicare Other

## 2018-12-06 LAB — BASIC METABOLIC PANEL
Anion gap: 9 (ref 5–15)
BUN: 25 mg/dL — ABNORMAL HIGH (ref 8–23)
CO2: 30 mmol/L (ref 22–32)
Calcium: 8.7 mg/dL — ABNORMAL LOW (ref 8.9–10.3)
Chloride: 102 mmol/L (ref 98–111)
Creatinine, Ser: 1.3 mg/dL — ABNORMAL HIGH (ref 0.44–1.00)
GFR calc Af Amer: 45 mL/min — ABNORMAL LOW (ref >60)
GFR calc non Af Amer: 38 mL/min — ABNORMAL LOW (ref >60)
Glucose, Bld: 95 mg/dL (ref 70–99)
Potassium: 3.7 mmol/L (ref 3.5–5.1)
Sodium: 141 mmol/L (ref 135–145)

## 2018-12-06 LAB — PROTIME-INR
INR: 3 — ABNORMAL HIGH (ref 0.8–1.2)
Prothrombin Time: 30.8 seconds — ABNORMAL HIGH (ref 11.4–15.2)

## 2018-12-06 LAB — CBC
HCT: 27.6 % — ABNORMAL LOW (ref 36.0–46.0)
Hemoglobin: 8 g/dL — ABNORMAL LOW (ref 12.0–15.0)
MCH: 25.8 pg — ABNORMAL LOW (ref 26.0–34.0)
MCHC: 29 g/dL — ABNORMAL LOW (ref 30.0–36.0)
MCV: 89 fL (ref 80.0–100.0)
Platelets: 171 10*3/uL (ref 150–400)
RBC: 3.1 MIL/uL — ABNORMAL LOW (ref 3.87–5.11)
RDW: 16.4 % — ABNORMAL HIGH (ref 11.5–15.5)
WBC: 4.1 10*3/uL (ref 4.0–10.5)
nRBC: 0 % (ref 0.0–0.2)

## 2018-12-06 MED ORDER — WARFARIN - PHARMACIST DOSING INPATIENT
Freq: Every day | Status: DC
  Administered 2018-12-11 – 2018-12-14 (×2)

## 2018-12-06 MED ORDER — FUROSEMIDE 10 MG/ML IJ SOLN
40.0000 mg | Freq: Once | INTRAMUSCULAR | Status: AC
  Administered 2018-12-06: 40 mg via INTRAVENOUS
  Filled 2018-12-06: qty 4

## 2018-12-06 MED ORDER — ENSURE ENLIVE PO LIQD
237.0000 mL | Freq: Two times a day (BID) | ORAL | Status: DC
  Administered 2018-12-07 (×2): 237 mL via ORAL

## 2018-12-06 MED ORDER — WARFARIN SODIUM 3 MG PO TABS
3.0000 mg | ORAL_TABLET | Freq: Once | ORAL | Status: AC
  Administered 2018-12-06: 3 mg via ORAL
  Filled 2018-12-06: qty 1

## 2018-12-06 MED ORDER — OXYCODONE HCL 5 MG PO TABS
2.5000 mg | ORAL_TABLET | Freq: Four times a day (QID) | ORAL | Status: DC | PRN
  Administered 2018-12-06 – 2018-12-08 (×6): 2.5 mg via ORAL
  Filled 2018-12-06 (×8): qty 1

## 2018-12-06 NOTE — Progress Notes (Signed)
Initial Nutrition Assessment  RD working remotely.  DOCUMENTATION CODES:   Not applicable  INTERVENTION:  Provide Ensure Enlive po BID, each supplement provides 350 kcal and 20 grams of protein.  Continue vitamin C 500 mg daily and zinc sulfate 220 mg daily. Recommend only providing for 10 days and then assessing if still needed. Long-term zinc supplementation in absence of copper supplementation can lead to copper deficiency.  NUTRITION DIAGNOSIS:     related to wound healing as evidenced by estimated needs.  GOAL:   Patient will meet greater than or equal to 90% of their needs  MONITOR:   PO intake, Supplement acceptance, Labs, Weight trends, Skin, I & O's  REASON FOR ASSESSMENT:   Malnutrition Screening Tool    ASSESSMENT:   84 year old female with PMHx of mitral valve replacement with mechanical valve on chronic Coumadin, atrial fibrillation/flutter s/p pacemaker, CKD IV, HTN, chronic systolic heart failure, lymphedema, GERD, HTN, left lower extremity wound s/p debridement admitted with acute on chronic systolic and diastolic heart failure.   Attempted to call patient's phone for nutrition/weight history multiple times but kept getting busy signal. Per chart patient is currently completing 95% of her meals, though unsure how much she is ordering. Weight is trending down. Patient was 66.2 kg on 6/3 and is now 65.5 kg. Likely related to fluid status.  Medications reviewed and include: amiodarone, vitamin D 2000 units daily, Lasix 80 mg Q12hrs IV, Miralax 17 grams daily, potassium chloride 20 mEq daily, vitamin C 500 mg daily, warfarin, zinc sulfate 220 mg daily.  Labs reviewed: BUN 25, Creatinine 1.3.  NUTRITION - FOCUSED PHYSICAL EXAM:  Unable to complete at this time.  Diet Order:   Diet Order            Diet 2 gram sodium Room service appropriate? Yes; Fluid consistency: Thin  Diet effective now             EDUCATION NEEDS:   Not appropriate for education at  this time  Skin:  Skin Assessment: Skin Integrity Issues:(closed incision left leg)  Last BM:  12/06/2018 - small type 5  Height:   Ht Readings from Last 1 Encounters:  12/04/18 4\' 11"  (1.499 m)   Weight:   Wt Readings from Last 1 Encounters:  12/06/18 65.5 kg   Ideal Body Weight:  44.7 kg  BMI:  Body mass index is 29.19 kg/m.  Estimated Nutritional Needs:   Kcal:  1500-1700  Protein:  75-85 grams  Fluid:  per MD  Willey Blade, MS, RD, LDN Office: [phone] Pager: [phone] After Hours/Weekend Pager: [phone]

## 2018-12-06 NOTE — Progress Notes (Signed)
Progress Note  Patient Name: [name] Date of Encounter: 12/06/2018  Primary Cardiologist: Ida Rogue, MD   Subjective   Continues to feel short of breath, though this has improved.  Complains of lower extremity discomfort.  Inpatient Medications    Scheduled Meds: . amiodarone  200 mg Oral Daily  . cholecalciferol  2,000 Units Oral Daily  . furosemide  80 mg Intravenous Q12H  . polyethylene glycol  17 g Oral Daily  . potassium chloride SA  20 mEq Oral Daily  . sodium chloride flush  3 mL Intravenous Q12H  . vitamin C  500 mg Oral Daily  . warfarin  3 mg Oral ONCE-1800  . Warfarin - Pharmacist Dosing Inpatient   Does not apply q1800  . zinc sulfate  220 mg Oral Daily   Continuous Infusions: . sodium chloride     PRN Meds: sodium chloride, acetaminophen, albuterol, ALPRAZolam, ipratropium-albuterol, ondansetron (ZOFRAN) IV, oxyCODONE, sodium chloride flush   Vital Signs    Vitals:   12/05/18 2228 12/06/18 0331 12/06/18 0802 12/06/18 0900  BP: (!) 112/54 103/77 135/63   Pulse: 61 63 63   Resp: 20 20 16    Temp:  98.2 F (36.8 C) 97.7 F (36.5 C)   TempSrc:  Oral Oral   SpO2: 98% 97% 99% 96%  Weight:  65.5 kg    Height:        Intake/Output Summary (Last 24 hours) at 12/06/2018 1308 Last data filed at 12/06/2018 0941 Gross per 24 hour  Intake 240 ml  Output 0 ml  Net 240 ml   Last 3 Weights 12/06/2018 12/05/2018 12/04/2018  Weight (lbs) 144 lb 8 oz 144 lb 14.4 oz 146 lb 9.6 oz  Weight (kg) 65.545 kg 65.726 kg 66.497 kg  Some encounter information is confidential and restricted. Go to Review Flowsheets activity to see all data.      Telemetry    Atrial sensed, ventricular paced- Personally Reviewed  ECG    None new- Personally Reviewed  Physical Exam   GEN: No acute distress.   Neck: No JVD Cardiac: RRR, no murmurs, rubs, or gallops.  Respiratory: Clear to auscultation bilaterally. GI: Soft, nontender, non-distended  MS: No edema; No  deformity. Neuro:  Nonfocal  Psych: Normal affect   Labs    Chemistry Recent Labs  Lab 12/04/18 1627 12/04/18 1954 12/05/18 0258 12/06/18 0442  NA 141 139 139 141  K 4.8 4.0 3.7 3.7  CL 100 99 100 102  CO2 30 30 31 30   GLUCOSE 94 98 88 95  BUN 25* 24* 25* 25*  CREATININE 1.20* 1.23* 1.19* 1.30*  CALCIUM 9.2 8.9 8.7* 8.7*  PROT 6.6 6.2*  --   --   ALBUMIN 3.7 3.5  --   --   AST 21 20  --   --   ALT 15 15  --   --   ALKPHOS 92 89  --   --   BILITOT 1.0 0.9  --   --   GFRNONAA 42* 41* 43* 38*  GFRAA 49* 48* 50* 45*  ANIONGAP 11 10 8 9      Hematology Recent Labs  Lab 12/04/18 1627 12/04/18 1954 12/06/18 0442  WBC 6.8 5.5 4.1  RBC 3.47* 3.44* 3.10*  HGB 9.2* 9.2* 8.0*  HCT 31.2* 30.6* 27.6*  MCV 89.9 89.0 89.0  MCH 26.5 26.7 25.8*  MCHC 29.5* 30.1 29.0*  RDW 16.4* 16.2* 16.4*  PLT 226 206 171    Cardiac Enzymes  Recent Labs  Lab 12/04/18 1954 12/05/18 0258 12/05/18 0902  TROPONINI 0.05* 0.04* 0.04*   No results for input(s): TROPIPOC in the last 168 hours.   BNP Recent Labs  Lab 12/04/18 1627 12/04/18 1954  BNP 1,189.0* 992.0*     DDimer No results for input(s): DDIMER in the last 168 hours.   Radiology    Dg Chest 1 View  Result Date: 12/06/2018 CLINICAL DATA:  Shortness of breath.  Lower extremity edema. EXAM: CHEST  1 VIEW COMPARISON:  Chest radiograph 12/05/2018 FINDINGS: Multi lead pacer apparatus overlies the left hemithorax. Leads are stable in position. Stable cardiomegaly. Similar-appearing patchy areas of consolidation within the mid and lower lungs bilaterally. Bilateral small to moderate pleural effusions. No pneumothorax. IMPRESSION: Cardiomegaly. Moderate bilateral pleural effusions with underlying consolidation favored to represent combination of atelectasis and edema. Superimposed infection not excluded. Electronically Signed   By: Lovey Newcomer M.D.   On: 12/06/2018 06:44   Dg Chest Port 1 View  Result Date: 12/05/2018 CLINICAL DATA:   Congestive heart failure. EXAM: PORTABLE CHEST 1 VIEW COMPARISON:  11/13/2018 FINDINGS: There is a new airspace opacity that appears to be centered within the right middle lobe. The cardiac size is enlarged. The lung volumes are low. There are additional hazy airspace opacities bilaterally with prominent interstitial lung markings. There is a multi lead left-sided pacemaker in place. The patient is status post prior mitral valve replacement. There are probable small bilateral pleural effusions. The patient appears to be status post multilevel vertebral augmentation. IMPRESSION: 1. Airspace opacity in the right middle lobe concerning for a developing infiltrate or atelectasis. 2. Cardiomegaly with volume overload and small bilateral pleural effusions. Electronically Signed   By: Constance Holster M.D.   On: 12/05/2018 03:24    Cardiac Studies   TTE  1. The left ventricle has severely reduced systolic function, with an ejection fraction of 25-30%. The cavity size was mildly dilated. Left ventricular diastolic Doppler parameters are consistent with impaired relaxation. Left ventrical global  hypokinesis without regional wall motion abnormalities.  2. The right ventricle has mildly reduced systolic function. The cavity was moderately enlarged. There is no increase in right ventricular wall thickness. Right ventricular systolic pressure is moderately elevated with an estimated pressure of 53 mmHg.  3. Right atrial size was moderately dilated.  4. Left atrial size was moderately dilated.  5. The aortic valve was not well visualized. Moderate calcification of the aortic valve. Aortic valve regurgitation was not assessed by color flow Doppler. Unable to estimate adegree of aortic valve stenosis given severely reduced systolic function.  6. Mechanical mitral valve. Mean gradient 5 mm Hg.  7. Tricuspid valve regurgitation is moderate.  8. Left pleural effusion noted, 6.5 cm  9. Rhythm is atrial tachycardia,  rate 105 bpm  Patient Profile     84 y.o. female with a history of persistent atrial fibrillation, chronic systolic heart failure status post CRT, MVR, aortic stenosis, CKD COPD who presented to the hospital with volume overload.  Assessment & Plan    1.  Acute on chronic systolic heart failure: Unfortunately, urine output has not been accurately measured.  The patient does say that she has been back-and-forth to the bedside quite a bit.  We Carlis Burnsworth continue IV diuresis today.  Her creatinine is mildly elevated from yesterday.  Likely this is due to some cardiorenal issues.  2.  Paroxysmal atrial fibrillation/atrial tachycardia: Currently AV paced.  Coumadin per pharmacy protocol  3.  Mechanical MVR: On  warfarin per pharmacy recommendations.      For questions or updates, please contact Bee Please consult [email or web address] for contact info under        Signed, Quantay Zaremba Meredith Leeds, MD  12/06/2018, 1:08 PM

## 2018-12-06 NOTE — Progress Notes (Signed)
[facility] [place] NAME: [name]    MR#:  [id number]  DATE OF BIRTH:  1934-09-20  SUBJECTIVE:   Patient is seen at the bedside. Patient is found laying in bed in NAD. Overall she feels her condition is unchanged. She continues to complain of pain in her lower extremities. Voices no new complaints.   REVIEW OF SYSTEMS:  Review of Systems  Constitutional: Positive for malaise/fatigue. Negative for chills, fever and weight loss.  HENT: Negative for congestion, hearing loss and sore throat.   Eyes: Negative for blurred vision and double vision.  Respiratory: Positive for shortness of breath. Negative for cough and wheezing.   Cardiovascular: Positive for orthopnea and leg swelling. Negative for chest pain and palpitations.  Gastrointestinal: Negative for abdominal pain, diarrhea, nausea and vomiting.  Genitourinary: Negative for dysuria and urgency.  Musculoskeletal: Negative for myalgias.  Skin: Negative for rash.  Neurological: Negative for dizziness, sensory change, speech change, focal weakness and headaches.  Psychiatric/Behavioral: Negative for depression.   DRUG ALLERGIES:   Allergies  Allergen Reactions  . Ace Inhibitors     Cough  . Doxycycline Nausea Only  . Hydrocodone Itching and Nausea Only  . Mercury     Other reaction(s): Unknown  . Silver Dermatitis    Severe itching  . Cephalexin Rash  . Clindamycin/Lincomycin Rash  . Nickel Rash  . Penicillins Rash    Has patient had a PCN reaction causing immediate rash, facial/tongue/throat swelling, SOB or lightheadedness with hypotension: YES Has patient had a PCN reaction causing severe rash involving mucus membranes or skin necrosis: NO Has patient had a PCN reaction that required hospitalization NO Has patient had a PCN reaction occurring within the last 10 years: NO If all of the above answers are "NO", then may proceed with Cephalosporin use.   VITALS:   Blood pressure 135/63, pulse 63, temperature 97.7 F (36.5 C), temperature source Oral, resp. rate 16, height 4\' 11"  (1.499 m), weight 65.5 kg, SpO2 96 %. PHYSICAL EXAMINATION:   GENERAL:  84 y.o.-year-old patient lying in the bed with no acute distress.  EYES: Pupils equal, round, reactive to light and accommodation. No scleral icterus. Extraocular muscles intact.  HEENT: Head atraumatic, normocephalic. Oropharynx and nasopharynx clear.  NECK:  Supple, no jugular venous distention. No thyroid enlargement, no tenderness.  LUNGS: Normal breath sounds bilaterally, no wheezing, rales,rhonchi or crepitation. No use of accessory muscles of respiration.  CARDIOVASCULAR: S1, S2 normal. systolic murmur, no rubs, or gallops.  ABDOMEN: Soft, nontender, nondistended. Bowel sounds present. No organomegaly or mass.  EXTREMITIES: Bilateral pitting edema of lower extremities.  Ace wrap to left leg, cyanosis, or clubbing. No rash or lesions. + pedal pulses MUSCULOSKELETAL: Normal bulk, and power was 5+ grip and elbow, knee, and ankle flexion and extension bilaterally.  NEUROLOGIC:Alert and oriented x 3. CN 2-12 intact. Sensation to light touch and cold stimuli intact bilaterally. Finger to nose nl. Babinski is downgoing. DTR's (biceps, patellar, and achilles) 2+ and symmetric throughout. Gait not tested due to safety concern. PSYCHIATRIC: The patient is alert and oriented x 3.  SKIN: No obvious rash, lesion, or ulcer.   DATA REVIEWED:  LABORATORY PANEL:  Female CBC Recent Labs  Lab 12/06/18 0442  WBC 4.1  HGB 8.0*  HCT 27.6*  PLT 171   ------------------------------------------------------------------------------------------------------------------ Chemistries  Recent Labs  Lab 12/04/18 1954  12/06/18 0442  NA 139   < > 141  K 4.0   < >  3.7  CL 99   < > 102  CO2 30   < > 30  GLUCOSE 98   < > 95  BUN 24*   < > 25*  CREATININE 1.23*   < > 1.30*  CALCIUM 8.9   < > 8.7*  MG 2.2  --   --    AST 20  --   --   ALT 15  --   --   ALKPHOS 89  --   --   BILITOT 0.9  --   --    < > = values in this interval not displayed.   RADIOLOGY:  Dg Chest 1 View  Result Date: 12/06/2018 CLINICAL DATA:  Shortness of breath.  Lower extremity edema. EXAM: CHEST  1 VIEW COMPARISON:  Chest radiograph 12/05/2018 FINDINGS: Multi lead pacer apparatus overlies the left hemithorax. Leads are stable in position. Stable cardiomegaly. Similar-appearing patchy areas of consolidation within the mid and lower lungs bilaterally. Bilateral small to moderate pleural effusions. No pneumothorax. IMPRESSION: Cardiomegaly. Moderate bilateral pleural effusions with underlying consolidation favored to represent combination of atelectasis and edema. Superimposed infection not excluded. Electronically Signed   By: Lovey Newcomer M.D.   On: 12/06/2018 06:44   Dg Chest Port 1 View  Result Date: 12/05/2018 CLINICAL DATA:  Congestive heart failure. EXAM: PORTABLE CHEST 1 VIEW COMPARISON:  11/13/2018 FINDINGS: There is a new airspace opacity that appears to be centered within the right middle lobe. The cardiac size is enlarged. The lung volumes are low. There are additional hazy airspace opacities bilaterally with prominent interstitial lung markings. There is a multi lead left-sided pacemaker in place. The patient is status post prior mitral valve replacement. There are probable small bilateral pleural effusions. The patient appears to be status post multilevel vertebral augmentation. IMPRESSION: 1. Airspace opacity in the right middle lobe concerning for a developing infiltrate or atelectasis. 2. Cardiomegaly with volume overload and small bilateral pleural effusions. Electronically Signed   By: Constance Holster M.D.   On: 12/05/2018 03:24   ASSESSMENT AND PLAN:   84 y.o. female with past medical history of persistent atrial fibrillation on Coumadin, chronic systolic heart failure status post CRT, mechanical MVR, mild aortic stenosis,  CKD, COPD with chronic respiratory failure on home oxygen presenting as a direct admission from a cardiologist Dr. Rockey Situ office for acute exacerbation of combined CHF.  1. 1. Acute on chronic systolic and diastolic Congestive Heart Failure: Acute presentation likely due to volume overload with associated symptoms of SOB, BLE edema. BNP elevated at 992  Last Echo 5/20 with  LV severely reduced with EF 25-30% - Repeat chest x-ray this am shows moderate bilateral pleural effusion with underlying consolidation consistent of atelectasis and edema. - Holding CHF meds (carvedilol, losartan and spironolactone due to hypotension in the setting of anemia and AKI)per cardiology - Diuretics: Furosemide 80mg  IV BID. Diureses >1L negative per day until approach euvolemia / worsening renal function. - Low salt diet  - Check daily weight - Strict I&Os - PT/OT consult - Cardiology input appreciated, will continue to follow with them  2. Atrial paroxysmal atrial fibrillation + Mechanical MVR -AV paced - Continue Coumadin  - Amiodarone for rate control. - Follow INR  3. COPD-  No signs of exacerbation - patient with hx of COPD uses home oxygen  - Supplemental O2, goal sat 88-92% - Bronchodilators (albuterol/ipratropium) standing and PRN   4. Left lower extremity wound status post debridement of large hematoma - Wound care to  follow - Plastics and surgery consult  5.  Elevated troponin -likely demand ischemia -Trending troponin   6. HTN- stable + Goal BP <140/90 - Beta-blocker: Metoprolol on hold per cardiology - ACE-Inhibitor: Resume as BP allows   7. CKD - Creatine slightly elevated - Continue to monitor and avoid nephrotoxins  8. DVT prophylaxis - Therapeutically anti-coagulated with warfarin   All the records are reviewed and case discussed with Care Management/Social Worker. Management plans discussed with the patient, family and they are in agreement.  CODE STATUS: Full Code  TOTAL  TIME TAKING CARE OF THIS PATIENT: 38 minutes.   More than 50% of the time was spent in counseling/coordination of care: YES  POSSIBLE D/C IN 1 DAYS, DEPENDING ON CLINICAL CONDITION.   on 12/06/2018 at 12:13 PM  This patient was staffed with Dr. Valetta Fuller, [facility] who personally evaluated patient, reviewed documentation and agreed with assessment and plan of care as above.  Rufina Falco, DNP, FNP-BC [facility] Nurse Practitioner   Between 7am to 6pm - Pager [phone]  After 6pm go to [email or web address] - Proofreader  [facility]  Office  [phone]  CC: Primary care physician; Rusty Aus, MD  Note: This dictation was prepared with Dragon dictation along with smaller phrase technology. Any transcriptional errors that result from this process are unintentional.

## 2018-12-06 NOTE — Progress Notes (Signed)
Notified NP of pt with pain to lower extremities. Pt would like something stronger than tylenol. Orders placed. Will continue to monitor and assess

## 2018-12-06 NOTE — Consult Note (Signed)
[place] for warfarin dose management Indication: atrial fibrillation  Patient Measurements: Height: 4\' 11"  (149.9 cm) Weight: 144 lb 8 oz (65.5 kg) IBW/kg (Calculated) : 43.2  Vital Signs: Temp: 98.2 F (36.8 C) (06/07 0331) Temp Source: Oral (06/07 0331) BP: 103/77 (06/07 0331) Pulse Rate: 63 (06/07 0331)  Labs: Recent Labs    12/04/18 1627 12/04/18 1954 12/05/18 0258 12/05/18 0902 12/06/18 0442  HGB 9.2* 9.2*  --   --  8.0*  HCT 31.2* 30.6*  --   --  27.6*  PLT 226 206  --   --  171  APTT  --  47*  --   --   --   LABPROT 31.3*  --  35.0* 34.9* 30.8*  INR 3.1*  --  3.6* 3.5* 3.0*  CREATININE 1.20* 1.23* 1.19*  --  1.30*  TROPONINI  --  0.05* 0.04* 0.04*  --     Estimated Creatinine Clearance: 27.9 mL/min (A) (by C-G formula based on SCr of 1.3 mg/dL (H)).   Medical History: Past Medical History:  Diagnosis Date  . Arthritis    "back, hands, knees" (04/10/2016)  . Chronic back pain   . 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 EF 25% by echo in 08/2015 b. RHC in 08/2015 showed normal filling pressures; c. 10/2018 Echo (in setting of atrial tachycardia): EF 25-30%, impaired relaxation. Glob HK. RVSP 64mmHg. Mod dil RA/LA. Mech MV w/ nl fxn (mean grad 55mmHg). Mod TR.   Marland Kitchen Chronic kidney disease   . Compression fracture    "several; all in my back" (04/10/2016)  . GERD (gastroesophageal reflux disease)   . Hypertension   . Lymphedema   . Pacemaker 05/2017  . PAF (paroxysmal atrial fibrillation) ([place])    a. On amio & Coumadin - CHA2DS2VASc = 4.  . S/P MVR (mitral valve replacement)    a. MVR 1994 b. redo MVR in 08/2014 - on Coumadin  . Shortness of breath dyspnea     Medications:  Scheduled:  . amiodarone  200 mg Oral Daily  . cholecalciferol  2,000 Units Oral Daily  . furosemide  80 mg Intravenous Q12H  . polyethylene glycol  17 g Oral Daily  . potassium chloride SA   20 mEq Oral Daily  . sodium chloride flush  3 mL Intravenous Q12H  . vitamin C  500 mg Oral Daily  . zinc sulfate  220 mg Oral Daily    Assessment: Pt was on warfarin previously for atrial fibrillation with mechanical mitral valve. On admission patient's INR was 3.1 (up to 3.6 today). Pt is on amiodarone which can increase INR. At home patient takes 1 tablet (6mg ) by mouth every Monday, Wednesday, Friday evening and take  tablet (3mg ) by mouth every Tuesday, Thursday, Saturday and Sunday evening. DDI  6/5 INR 3.1 6/6 INR 3.6  HELD 6/7 INR 3.0  Goal of Therapy:  INR 2-3 given recent bleed (per cardiology on previous visit, even with valve). Monitor platelets by anticoagulation protocol: Yes   Plan:  --INR 3.0 : will order warfarin 3 mg x 1 and repeat INR in am --DDI with amiodarone (home med)  Noralee Space, PharmD 12/06/2018,8:02 AM

## 2018-12-06 NOTE — Progress Notes (Signed)
After getting order for Oxycodone 2.5 mg, in to see pt, and the pt had fallen back asleep. No medicine given at this time. Will continue to monitor and assess.

## 2018-12-07 ENCOUNTER — Inpatient Hospital Stay: Payer: Medicare Other

## 2018-12-07 DIAGNOSIS — I5023 Acute on chronic systolic (congestive) heart failure: Secondary | ICD-10-CM

## 2018-12-07 LAB — BASIC METABOLIC PANEL
Anion gap: 6 (ref 5–15)
BUN: 21 mg/dL (ref 8–23)
CO2: 34 mmol/L — ABNORMAL HIGH (ref 22–32)
Calcium: 8.8 mg/dL — ABNORMAL LOW (ref 8.9–10.3)
Chloride: 99 mmol/L (ref 98–111)
Creatinine, Ser: 1.22 mg/dL — ABNORMAL HIGH (ref 0.44–1.00)
GFR calc Af Amer: 48 mL/min — ABNORMAL LOW (ref >60)
GFR calc non Af Amer: 42 mL/min — ABNORMAL LOW (ref >60)
Glucose, Bld: 101 mg/dL — ABNORMAL HIGH (ref 70–99)
Potassium: 4 mmol/L (ref 3.5–5.1)
Sodium: 139 mmol/L (ref 135–145)

## 2018-12-07 LAB — PROTIME-INR
INR: 2.6 — ABNORMAL HIGH (ref 0.8–1.2)
Prothrombin Time: 27.1 seconds — ABNORMAL HIGH (ref 11.4–15.2)

## 2018-12-07 LAB — CBC WITH DIFFERENTIAL/PLATELET
Abs Immature Granulocytes: 0.03 10*3/uL (ref 0.00–0.07)
Basophils Absolute: 0.1 10*3/uL (ref 0.0–0.1)
Basophils Relative: 1 %
Eosinophils Absolute: 0.1 10*3/uL (ref 0.0–0.5)
Eosinophils Relative: 2 %
HCT: 30.1 % — ABNORMAL LOW (ref 36.0–46.0)
Hemoglobin: 8.8 g/dL — ABNORMAL LOW (ref 12.0–15.0)
Immature Granulocytes: 1 %
Lymphocytes Relative: 11 %
Lymphs Abs: 0.7 10*3/uL (ref 0.7–4.0)
MCH: 25.9 pg — ABNORMAL LOW (ref 26.0–34.0)
MCHC: 29.2 g/dL — ABNORMAL LOW (ref 30.0–36.0)
MCV: 88.5 fL (ref 80.0–100.0)
Monocytes Absolute: 0.7 10*3/uL (ref 0.1–1.0)
Monocytes Relative: 11 %
Neutro Abs: 4.7 10*3/uL (ref 1.7–7.7)
Neutrophils Relative %: 74 %
Platelets: 199 10*3/uL (ref 150–400)
RBC: 3.4 MIL/uL — ABNORMAL LOW (ref 3.87–5.11)
RDW: 16.4 % — ABNORMAL HIGH (ref 11.5–15.5)
WBC: 6.3 10*3/uL (ref 4.0–10.5)
nRBC: 0 % (ref 0.0–0.2)

## 2018-12-07 MED ORDER — WARFARIN SODIUM 5 MG PO TABS: 5.0000 mg | ORAL_TABLET | Freq: Once | ORAL | Status: DC

## 2018-12-07 MED ORDER — LOSARTAN POTASSIUM 25 MG PO TABS
12.5000 mg | ORAL_TABLET | Freq: Every day | ORAL | Status: DC
  Administered 2018-12-07 – 2018-12-15 (×9): 12.5 mg via ORAL
  Filled 2018-12-07 (×9): qty 1

## 2018-12-07 MED ORDER — WARFARIN SODIUM 4 MG PO TABS
4.0000 mg | ORAL_TABLET | Freq: Once | ORAL | Status: AC
  Administered 2018-12-07: 4 mg via ORAL
  Filled 2018-12-07: qty 1

## 2018-12-07 MED ORDER — CARVEDILOL 3.125 MG PO TABS
3.1250 mg | ORAL_TABLET | Freq: Two times a day (BID) | ORAL | Status: DC
  Administered 2018-12-07 – 2018-12-15 (×16): 3.125 mg via ORAL
  Filled 2018-12-07 (×15): qty 1

## 2018-12-07 NOTE — Progress Notes (Signed)
Pt bladder scanned per MD order. 52ml of urine found. I will continue to assess.

## 2018-12-07 NOTE — Progress Notes (Signed)
Progress Note  Patient Name: [name] Date of Encounter: 12/07/2018  Primary Cardiologist: Ida Rogue, MD   Subjective   She reports improvement in shortness of breath and leg edema.  Her daughter is at the bedside and the case was discussed with her.  Inpatient Medications    Scheduled Meds:  amiodarone  200 mg Oral Daily   carvedilol  3.125 mg Oral BID WC   cholecalciferol  2,000 Units Oral Daily   feeding supplement (ENSURE ENLIVE)  237 mL Oral BID BM   furosemide  80 mg Intravenous Q12H   losartan  12.5 mg Oral Daily   polyethylene glycol  17 g Oral Daily   potassium chloride SA  20 mEq Oral Daily   sodium chloride flush  3 mL Intravenous Q12H   vitamin C  500 mg Oral Daily   warfarin  4 mg Oral ONCE-1800   Warfarin - Pharmacist Dosing Inpatient   Does not apply q1800   zinc sulfate  220 mg Oral Daily   Continuous Infusions:  sodium chloride     PRN Meds: sodium chloride, acetaminophen, albuterol, ALPRAZolam, ipratropium-albuterol, ondansetron (ZOFRAN) IV, oxyCODONE, sodium chloride flush   Vital Signs    Vitals:   12/06/18 2303 12/07/18 0250 12/07/18 0451 12/07/18 0717  BP: (!) 108/58  130/61 (!) 117/55  Pulse: 63  63 60  Resp:   20 16  Temp:   98.1 F (36.7 C) 98 F (36.7 C)  TempSrc:   Oral Oral  SpO2:   94% 90%  Weight:  64.8 kg    Height:        Intake/Output Summary (Last 24 hours) at 12/07/2018 1108 Last data filed at 12/07/2018 0250 Gross per 24 hour  Intake --  Output 900 ml  Net -900 ml   Last 3 Weights 12/07/2018 12/06/2018 12/05/2018  Weight (lbs) 142 lb 12.8 oz 144 lb 8 oz 144 lb 14.4 oz  Weight (kg) 64.774 kg 65.545 kg 65.726 kg  Some encounter information is confidential and restricted. Go to Review Flowsheets activity to see all data.      Telemetry    AV paced rhythm- Personally Reviewed  ECG    No new EKGs- Personally Reviewed  Physical Exam   GEN: No acute distress.  Frail old female Neck:  Mild  JVD Cardiac: RRR, no  rubs, or gallops.  2 out of 6 systolic murmur at the base Respiratory:  Mildly diminished breath sounds with some bibasilar crackles GI: Soft, nontender, non-distended  MS:  No deformity.  Mild bilateral leg edema worse on the right side. Neuro:  Nonfocal  Psych: Normal affect   Labs    Chemistry Recent Labs  Lab 12/04/18 1627 12/04/18 1954 12/05/18 0258 12/06/18 0442 12/07/18 0453  NA 141 139 139 141 139  K 4.8 4.0 3.7 3.7 4.0  CL 100 99 100 102 99  CO2 30 30 31 30  34*  GLUCOSE 94 98 88 95 101*  BUN 25* 24* 25* 25* 21  CREATININE 1.20* 1.23* 1.19* 1.30* 1.22*  CALCIUM 9.2 8.9 8.7* 8.7* 8.8*  PROT 6.6 6.2*  --   --   --   ALBUMIN 3.7 3.5  --   --   --   AST 21 20  --   --   --   ALT 15 15  --   --   --   ALKPHOS 92 89  --   --   --   BILITOT 1.0 0.9  --   --   --  GFRNONAA 42* 41* 43* 38* 42*  GFRAA 49* 48* 50* 45* 48*  ANIONGAP 11 10 8 9 6      Hematology Recent Labs  Lab 12/04/18 1954 12/06/18 0442 12/07/18 0453  WBC 5.5 4.1 6.3  RBC 3.44* 3.10* 3.40*  HGB 9.2* 8.0* 8.8*  HCT 30.6* 27.6* 30.1*  MCV 89.0 89.0 88.5  MCH 26.7 25.8* 25.9*  MCHC 30.1 29.0* 29.2*  RDW 16.2* 16.4* 16.4*  PLT 206 171 199    Cardiac Enzymes Recent Labs  Lab 12/04/18 1954 12/05/18 0258 12/05/18 0902  TROPONINI 0.05* 0.04* 0.04*   No results for input(s): TROPIPOC in the last 168 hours.   BNP Recent Labs  Lab 12/04/18 1627 12/04/18 1954  BNP 1,189.0* 992.0*     DDimer No results for input(s): DDIMER in the last 168 hours.   Radiology    Dg Chest 1 View  Result Date: 12/07/2018 CLINICAL DATA:  Shortness of breath EXAM: CHEST  1 VIEW COMPARISON:  12/06/2018 FINDINGS: Cardiac shadow is enlarged but stable. Pacing device and postsurgical changes are again seen. Small effusions are again seen and stable. Patchy infiltrates are again noted right greater than left but stable from the prior exam. No new focal abnormality is noted. IMPRESSION: Stable  airspace opacities right greater than left with associated small effusions. Electronically Signed   By: Inez Catalina M.D.   On: 12/07/2018 07:30   Dg Chest 1 View  Result Date: 12/06/2018 CLINICAL DATA:  Shortness of breath.  Lower extremity edema. EXAM: CHEST  1 VIEW COMPARISON:  Chest radiograph 12/05/2018 FINDINGS: Multi lead pacer apparatus overlies the left hemithorax. Leads are stable in position. Stable cardiomegaly. Similar-appearing patchy areas of consolidation within the mid and lower lungs bilaterally. Bilateral small to moderate pleural effusions. No pneumothorax. IMPRESSION: Cardiomegaly. Moderate bilateral pleural effusions with underlying consolidation favored to represent combination of atelectasis and edema. Superimposed infection not excluded. Electronically Signed   By: Lovey Newcomer M.D.   On: 12/06/2018 06:44    Cardiac Studies   2D Echo 11/05/2018: 1. The left ventricle has severely reduced systolic function, with an ejection fraction of 25-30%. The cavity size was mildly dilated. Left ventricular diastolic Doppler parameters are consistent with impaired relaxation. Left ventrical global hypokinesis without regional wall motion abnormalities. 2. The right ventricle has mildly reduced systolic function. The cavity was moderately enlarged. There is no increase in right ventricular wall thickness. Right ventricular systolic pressure is moderately elevated with an estimated pressure of 53 mmHg. 3. Right atrial size was moderately dilated. 4. Left atrial size was moderately dilated. 5. The aortic valve was not well visualized. Moderate calcification of the aortic valve. Aortic valve regurgitation was not assessed by color flow Doppler. Unable to estimate adegree of aortic valve stenosis given severely reduced systolic function. 6. Mechanical mitral valve. Mean gradient 5 mm Hg. 7. Tricuspid valve regurgitation is moderate. 8. Left pleural effusion noted, 6.5 cm 9. Rhythm is  atrial tachycardia, rate 105 bpm   Patient Profile     84 y.o. female with history of persistent atrial fibrillation status post cardioversion (most recently Nov 06, 3714), chronic systolic congestive heart failure status post CRT, mechanical mitral valve replacement on chronic Coumadin, mild aortic stenosis,Stage IV chronic kidney disease, COPD on home O2 and recent prolonged hospitalization for  spontaneous left calf hematoma and acute blood loss anemia who was admitted from clinic due to acute on chronic systolic heart failure with volume overload and hypoxia  Assessment & Plan  1.  Acute on chronic systolic heart failure: She continues to be mildly volume overloaded but has improved since admission.  I personally reviewed her chest x-ray which continues to show bilateral infiltrates versus heart failure with small pleural effusions.  I recommend continuing IV diuresis at the current dose. She was previously on small dose carvedilol, losartan and spironolactone.  These were held in the setting of hypotension which was likely worsened by blood loss anemia.  I am going to see if she can tolerate small dose losartan and carvedilol.  2.  Persistent atrial fibrillation and atrial tachycardia: She remains in AV paced rhythm.  Continue amiodarone and warfarin.  3.  History of mechanical mitral valve replacement: Continue anticoagulation with warfarin with a goal INR between 2.5-3.5.  4.  Chronic kidney disease: Renal function is stable.      For questions or updates, please contact [facility] Please consult [email or web address] for contact info under        Signed, Kathlyn Sacramento, MD  12/07/2018, 11:08 AM

## 2018-12-07 NOTE — Progress Notes (Signed)
PT Cancellation Note  Patient Details Name: [name] MRN: [id number] DOB: 1935-04-04   Cancelled Treatment:    Reason Eval/Treat Not Completed: (Treatment session attempted.  Patient declined session despite encouragement due to "just not feeling up to it right now".  Provided with active listening/support, but unable to elicit participation with session.  will continue efforts at later time/date as medically appropriate and patient allows.  Of note, did add OT consult to patient care team for continued progression of ADLs and self-care activities.)   Kolbey Teichert H. Owens Shark, PT, DPT, NCS 12/07/18, 11:57 AM [phone]

## 2018-12-07 NOTE — Evaluation (Signed)
Occupational Therapy Evaluation Patient Details Name: [name] MRN: [id number] DOB: 10-Mar-1935 Today's Date: 12/07/2018    History of Present Illness [name] is an 84yoF who comes to [facility] from cardiology after a/c exacerbation of sCHF. Pt has been at [facility] for <1W s/p DC from this site for a similar problem. PMH: AF, chronic back pain, combined CHF, CKD, compression fracture of spine, PPM, MR, pulmonary HTN, LEE, chronic O2 use 3L, recently 4-6L.    Clinical Impression   Pt seen for OT evaluation this date. Prior to hospital admission, pt was at rehab from previous recent hospital admission. At baseline, pt lives with her daughter and receives assist from a PCA and her daughter for ADL and IADL. Pt on 3L O2 at home chronically, per pt report. Recently has required additional supplemental O2, but on 3L at time of OT evaluation. Currently pt demonstrates impairments in activity tolerance, R shoulder and BLE pain, balance, strength, and cardiopulmonary status requiring mod-max assist for LB ADL and Min A for seated UB ADL. Pt deferred functional ADL mobility assessment 2/2 fatigue/pain. Pt declined therapist's offer to notify RN of pt's pain. Pt instructed in energy conservations including home/routines modifications to minimize effort and maximize safety/indep. Pt reiterates that she has good routines at home with her PCA/daughter for ADL tasks. Additional education provided in pt's current functional status and impairments and how these will impact her ability to perform her ADL routines the same way at home. Pt verbalizes understanding and agreeable to additional skilled OT services to address noted impairments and functional limitations (see below for any additional details) in order to maximize safety and independence while minimizing falls risk and caregiver burden.  Upon hospital discharge, recommend pt discharge to SNF to continue rehab in order to facilitate future safe  discharge home with supports.    Follow Up Recommendations  SNF    Equipment Recommendations  3 in 1 bedside commode    Recommendations for Other Services       Precautions / Restrictions Precautions Precautions: Fall Precaution Comments: Per Dr Bary Castilla, pt has no weight bearing precautions Restrictions Weight Bearing Restrictions: No      Mobility Bed Mobility               General bed mobility comments: pt deferred 2/2 fatigue and pain  Transfers                 General transfer comment: pt deferred 2/2 fatigue and pain    Balance                                           ADL either performed or assessed with clinical judgement   ADL Overall ADL's : Needs assistance/impaired Eating/Feeding: Independent;Sitting   Grooming: Set up;Sitting;Supervision/safety   Upper Body Bathing: Minimal assistance;Sitting   Lower Body Bathing: Sitting/lateral leans;Moderate assistance;Maximal assistance   Upper Body Dressing : Sitting;Minimal assistance   Lower Body Dressing: Sitting/lateral leans;Moderate assistance;Maximal assistance                       Vision Baseline Vision/History: Wears glasses Wears Glasses: At all times Patient Visual Report: No change from baseline       Perception     Praxis      Pertinent Vitals/Pain Pain Assessment: 0-10 Pain Score: 6  Pain Location: Shoulder and LLE  hemotoma site>RLE Pain Descriptors / Indicators: Aching Pain Intervention(s): Limited activity within patient's tolerance;Monitored during session;Repositioned     Hand Dominance Right   Extremity/Trunk Assessment Upper Extremity Assessment Upper Extremity Assessment: Generalized weakness(grossly 4/5 bilaterally, R shoulder pain with flexion and abduction, feels like "a pulled muscle")   Lower Extremity Assessment Lower Extremity Assessment: Generalized weakness;LLE deficits/detail LLE Deficits / Details: LLE previous hematoma,  wound       Communication Communication Communication: No difficulties   Cognition Arousal/Alertness: Awake/alert Behavior During Therapy: WFL for tasks assessed/performed Overall Cognitive Status: Within Functional Limits for tasks assessed                                     General Comments  BLE wraps in place, elevated    Exercises Other Exercises Other Exercises: pt instructed in energy conservations including home/routines modifications to minimize effort and maximize safety/indep   Shoulder Instructions      Home Living Family/patient expects to be discharged to:: Skilled nursing facility(return to STR at DC ) Living Arrangements: Children(DTR) Available Help at Discharge: Family;Available PRN/intermittently;Personal care attendant   Home Access: Ramped entrance;Stairs to enter     Home Layout: Two level;1/2 bath on main level Alternate Level Stairs-Number of Steps: 13 Alternate Level Stairs-Rails: Right Bathroom Shower/Tub: Walk-in shower         Home Equipment: Environmental consultant - 4 wheels;Toilet riser;Grab bars - toilet;Grab bars - tub/shower;Shower seat - built in          Prior Functioning/Environment Level of Independence: Needs assistance  Gait / Transfers Assistance Needed: uses rollator at baseline for household mobility ADL's / Homemaking Assistance Needed: an aide comes 5x a week, 8a-12p to assist with ADLs/IADLs   Comments: denies falls in the last 6 months        OT Problem List: Decreased strength;Decreased knowledge of use of DME or AE;Increased edema;Decreased activity tolerance;Impaired UE functional use;Cardiopulmonary status limiting activity;Pain;Impaired balance (sitting and/or standing)      OT Treatment/Interventions: Self-care/ADL training;Therapeutic exercise;Patient/family education;Modalities;Neuromuscular education;Energy conservation;Therapeutic activities;DME and/or AE instruction    OT Goals(Current goals can be found  in the care plan section) Acute Rehab OT Goals Patient Stated Goal: get back to my old routines at home OT Goal Formulation: With patient Time For Goal Achievement: 12/21/18 Potential to Achieve Goals: Good ADL Goals Pt Will Perform Upper Body Bathing: with set-up;with supervision;sitting(using learned ECS, AE as needed) Pt Will Perform Lower Body Bathing: with set-up;with min assist;sitting/lateral leans;with adaptive equipment(using learned ECS, AE as needed) Pt Will Transfer to Toilet: with supervision;ambulating(LRAD for amb, using learned ECS) Additional ADL Goal #1: Pt will independently verbalize a plan to implement at least 3 learned ECS into her daily routine for improved independence and participation in meaningful occupations of daily living upon discharge.  OT Frequency: Min 2X/week   Barriers to D/C:            Co-evaluation              AM-PAC OT "6 Clicks" Daily Activity     Outcome Measure Help from another person eating meals?: None Help from another person taking care of personal grooming?: A Little Help from another person toileting, which includes using toliet, bedpan, or urinal?: A Lot Help from another person bathing (including washing, rinsing, drying)?: A Lot Help from another person to put on and taking off regular upper body clothing?: A Little Help from another  person to put on and taking off regular lower body clothing?: A Lot 6 Click Score: 16   End of Session    Activity Tolerance: Patient limited by pain;Patient limited by fatigue Patient left: in bed;with call bell/phone within reach;with bed alarm set  OT Visit Diagnosis: Other abnormalities of gait and mobility (R26.89);Muscle weakness (generalized) (M62.81);Pain Pain - Right/Left: Left(and R) Pain - part of body: Leg(and R shoulder)                Time: 1341-1401 OT Time Calculation (min): 20 min Charges:  OT General Charges $OT Visit: 1 Visit OT Evaluation $OT Eval Moderate  Complexity: 1 Mod OT Treatments $Self Care/Home Management : 8-22 mins  Jeni Salles, MPH, MS, OTR/L ascom [phone] 12/07/18, 2:54 PM

## 2018-12-07 NOTE — Care Management Important Message (Signed)
Important Message  Patient Details  Name: [name] MRN: [id number] Date of Birth: 01/15/1935   Medicare Important Message Given:  Yes    [name] 12/07/2018, 11:44 AM

## 2018-12-07 NOTE — Consult Note (Addendum)
[facility] Nurse wound consult note Reason for Consult: Consult requested for left leg wound.  Reviewed previous progress notes in the EMR.  Dr Marla Roe of the plastics team performed surgery during the previous admission and applied A-cell over the wound bed.  She has ordered daily dressing changes with a nonadherent dressing over the wound bed to protect the site, then KY jelly, gauze and abd pads, and kerlex and ace wrap.  Removed the dressings to assess the site with daughter present at the bedside to assess wound appearance and discuss plan of care, she is very well informed and patient has been followed as an outpatient in the surgeon's office.  Dr Bary Castilla recently assessed the wound appearance in his office as an outpatient this week, according to the daughter at the bedside, and she does not want the nonadherent dressing removed since he placed it during the previous visit.  The surgeon entered the room as I was changing the dressing and he was able to assess the wound, leaving the Mepitel contact layer in place.  Wound is full thickness to middle calf; approx 20X12cm, covered by contact layer and able to visualize yellow woundbed of A-cell site, small amt yellow drainage, no odor There is a reddish purple area to right upper posterior thigh; 1X1cm, present on admission. Pt has mod amt edema to RLE and multiple dark purple reddish bruises. Left outer foot with unstageable pressure injury; 1X1cm, black dry eschar, no odor, drainage, or fluctuance. Left posterior achilies area with unstageable pressure injury; 3X1cm, black dry eschar, no odor, drainage, or fluctuance. Daughter assessed all sites and noted they were present on admission Plan: Applied dressing as previously ordered by Dr Marla Roe:  daily dressing changes with a nonadherent dressing over the wound bed to protect the site, then KY jelly, gauze and abd pads, and kerlex and ace wrap. Plan for bedside nurses to change Q day.  Float heels to reduce  pressure.  Xeroform gauze to left outer foot and posterior achilles Q day to promote moist healing and foam dressing to protect sites from further injury. Plan for bedside nurses to change Q day.  Added kerlex and ace wrap to right leg per daughter and Dr Dwyane Luo request to reduce edema. This should be re-wrapped Q am by the bedside nurses.   Discussed plan of care with daughter and she denies further questions. One hour spent performing this consult. Please re-consult if further assistance is needed.  Thank-you,  [name] MSN, [place], [place], [place], [place]

## 2018-12-07 NOTE — Progress Notes (Signed)
[facility] NAME: [name]    MR#:  [id number]  DATE OF BIRTH:  [birth year]  SUBJECTIVE:   Patient is seen at the bedside. Patient is found laying in bed in NAD. Daughter [name] at the bedside. Overall she feels her condition is improved today. Patient state that pain in her lower extremities is well controlled with current PRN pain meds.  REVIEW OF SYSTEMS:  Review of Systems  Constitutional: Positive for malaise/fatigue. Negative for chills, fever and weight loss.  HENT: Negative for congestion, hearing loss and sore throat.   Eyes: Negative for blurred vision and double vision.  Respiratory: Positive for shortness of breath. Negative for cough and wheezing.   Cardiovascular: Positive for orthopnea and leg swelling. Negative for chest pain and palpitations.  Gastrointestinal: Negative for abdominal pain, diarrhea, nausea and vomiting.  Genitourinary: Negative for dysuria and urgency.  Musculoskeletal: Negative for myalgias.  Skin: Negative for rash.  Neurological: Negative for dizziness, sensory change, speech change, focal weakness and headaches.  Psychiatric/Behavioral: Negative for depression.   DRUG ALLERGIES:   Allergies  Allergen Reactions  . Ace Inhibitors     Cough  . Doxycycline Nausea Only  . Hydrocodone Itching and Nausea Only  . Mercury     Other reaction(s): Unknown  . Silver Dermatitis    Severe itching  . Cephalexin Rash  . Clindamycin/Lincomycin Rash  . Nickel Rash  . Penicillins Rash    Has patient had a PCN reaction causing immediate rash, facial/tongue/throat swelling, SOB or lightheadedness with hypotension: YES Has patient had a PCN reaction causing severe rash involving mucus membranes or skin necrosis: NO Has patient had a PCN reaction that required hospitalization NO Has patient had a PCN reaction occurring within the last 10 years: NO If all of the above answers are "NO", then may  proceed with Cephalosporin use.   VITALS:  Blood pressure (!) 117/55, pulse 60, temperature 98 F (36.7 C), temperature source Oral, resp. rate 16, height 4\' 11"  (1.499 m), weight 64.8 kg, SpO2 90 %. PHYSICAL EXAMINATION:   GENERAL:  84 y.o.-year-old patient lying in the bed with no acute distress.  EYES: Pupils equal, round, reactive to light and accommodation. No scleral icterus. Extraocular muscles intact.  HEENT: Head atraumatic, normocephalic. Oropharynx and nasopharynx clear.  NECK:  Supple, no jugular venous distention. No thyroid enlargement, no tenderness.  LUNGS: Normal breath sounds bilaterally, no wheezing, rales,rhonchi or crepitation. No use of accessory muscles of respiration.  CARDIOVASCULAR: S1, S2 normal. Click, loud systolic murmur, no rubs, or gallops.  ABDOMEN: Soft, nontender, nondistended. Bowel sounds present. No organomegaly or mass.  EXTREMITIES: Bilateral pitting edema of lower extremities.  Ace wrap to left leg, no cyanosis, or clubbing. No rash or lesions. + pedal pulses MUSCULOSKELETAL: Normal bulk, and power was 5+ grip and elbow, knee, and ankle flexion and extension bilaterally.  NEUROLOGIC:Alert and oriented x 3. CN 2-12 intact. Sensation to light touch decreased in lower extremities Gait not tested due to safety concern. PSYCHIATRIC: The patient is alert and oriented x 3.  SKIN: Left leg wound, see wound care notes  DATA REVIEWED:  LABORATORY PANEL:  Female CBC Recent Labs  Lab 12/07/18 0453  WBC 6.3  HGB 8.8*  HCT 30.1*  PLT 199   ------------------------------------------------------------------------------------------------------------------ Chemistries  Recent Labs  Lab 12/04/18 1954  12/07/18 0453  NA 139   < > 139  K 4.0   < > 4.0  CL 99   < >  99  CO2 30   < > 34*  GLUCOSE 98   < > 101*  BUN 24*   < > 21  CREATININE 1.23*   < > 1.22*  CALCIUM 8.9   < > 8.8*  MG 2.2  --   --   AST 20  --   --   ALT 15  --   --   ALKPHOS 89  --    --   BILITOT 0.9  --   --    < > = values in this interval not displayed.   RADIOLOGY:  Dg Chest 1 View  Result Date: 12/07/2018 CLINICAL DATA:  Shortness of breath EXAM: CHEST  1 VIEW COMPARISON:  12/06/2018 FINDINGS: Cardiac shadow is enlarged but stable. Pacing device and postsurgical changes are again seen. Small effusions are again seen and stable. Patchy infiltrates are again noted right greater than left but stable from the prior exam. No new focal abnormality is noted. IMPRESSION: Stable airspace opacities right greater than left with associated small effusions. Electronically Signed   By: Inez Catalina M.D.   On: 12/07/2018 07:30   Dg Chest 1 View  Result Date: 12/06/2018 CLINICAL DATA:  Shortness of breath.  Lower extremity edema. EXAM: CHEST  1 VIEW COMPARISON:  Chest radiograph 12/05/2018 FINDINGS: Multi lead pacer apparatus overlies the left hemithorax. Leads are stable in position. Stable cardiomegaly. Similar-appearing patchy areas of consolidation within the mid and lower lungs bilaterally. Bilateral small to moderate pleural effusions. No pneumothorax. IMPRESSION: Cardiomegaly. Moderate bilateral pleural effusions with underlying consolidation favored to represent combination of atelectasis and edema. Superimposed infection not excluded. Electronically Signed   By: Lovey Newcomer M.D.   On: 12/06/2018 06:44   ASSESSMENT AND PLAN:   84 y.o. female with past medical history of persistent atrial fibrillation on Coumadin, chronic systolic heart failure status post CRT, mechanical MVR, mild aortic stenosis, CKD, COPD with chronic respiratory failure on home oxygen presenting as a direct admission from a cardiologist Dr. Rockey Situ office for acute exacerbation of combined CHF.  1. 1. Acute on chronic systolic and diastolic Congestive Heart Failure: Acute presentation likely due to volume overload with associated symptoms of SOB, BLE edema. BNP elevated at 992  Last Echo 5/20 with  LV severely  reduced with EF 25-30% - Repeat chest x-ray this am shows moderate bilateral pleural effusion with underlying consolidation consistent of atelectasis and edema. - Holding CHF meds (carvedilol, losartan and spironolactone due to hypotension in the setting of anemia and AKI)per cardiology - Diuretics: Furosemide 80mg  IV BID. Diureses >1L negative per day until approach euvolemia / worsening renal function. - Low salt diet  - Check daily weight - Strict I&Os - PT/OT consult - Cardiology input appreciated, will continue to follow with them  2. Atrial paroxysmal atrial fibrillation + Mechanical MVR -AV paced - Continue Coumadin  - Amiodarone for rate control. - Follow INR  3. COPD-  No signs of exacerbation - patient with hx of COPD uses home oxygen  - Supplemental O2, goal sat 88-92% - Bronchodilators (albuterol/ipratropium) standing and PRN   4. Left lower extremity wound status post debridement of large hematoma - Wound care following - Plastics and surgery consult  5.  Elevated troponin -likely demand ischemia -Trending troponin   6. HTN- stable + Goal BP <140/90 - Beta-blocker: Metoprolol on hold per cardiology - ACE-Inhibitor: Held due to low Bp will resume as BP allows   7. CKD - BUN/Cr improved today - Continue to  monitor and avoid nephrotoxins  8. DVT prophylaxis - Therapeutically anti-coagulated with warfarin   All the records are reviewed and case discussed with Care Management/Social Worker. Management plans discussed with the patient, family and they are in agreement.  CODE STATUS: Full Code  TOTAL TIME TAKING CARE OF THIS PATIENT: 37 minutes.   More than 50% of the time was spent in counseling/coordination of care: YES  POSSIBLE D/C IN 1 DAYS, DEPENDING ON CLINICAL CONDITION.   on 12/07/2018 at 9:53 AM  This patient was staffed with Dr. Valetta Fuller, [facility] who personally evaluated patient, reviewed documentation and agreed with assessment and plan of care as above.   Rufina Falco, DNP, FNP-BC [facility] Nurse Practitioner   Between 7am to 6pm - Pager [phone]  After 6pm go to [email or web address] - Proofreader  [facility]  Office  [phone]  CC: Primary care physician; Rusty Aus, MD  Note: This dictation was prepared with Dragon dictation along with smaller phrase technology. Any transcriptional errors that result from this process are unintentional.

## 2018-12-07 NOTE — Consult Note (Addendum)
[place] for warfarin dose management Indication: atrial fibrillation  Patient Measurements: Height: 4\' 11"  (149.9 cm) Weight: 142 lb 12.8 oz (64.8 kg) IBW/kg (Calculated) : 43.2  Vital Signs: Temp: 98 F (36.7 C) (06/08 0717) Temp Source: Oral (06/08 0717) BP: 117/55 (06/08 0717) Pulse Rate: 60 (06/08 0717)  Labs: Recent Labs    12/04/18 1954 12/05/18 0258 12/05/18 0902 12/06/18 0442 12/07/18 0453  HGB 9.2*  --   --  8.0* 8.8*  HCT 30.6*  --   --  27.6* 30.1*  PLT 206  --   --  171 199  APTT 47*  --   --   --   --   LABPROT  --  35.0* 34.9* 30.8* 27.1*  INR  --  3.6* 3.5* 3.0* 2.6*  CREATININE 1.23* 1.19*  --  1.30* 1.22*  TROPONINI 0.05* 0.04* 0.04*  --   --     Estimated Creatinine Clearance: 29.6 mL/min (A) (by C-G formula based on SCr of 1.22 mg/dL (H)).   Medical History: Past Medical History:  Diagnosis Date  . Arthritis    "back, hands, knees" (04/10/2016)  . Chronic back pain   . 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 EF 25% by echo in 08/2015 b. RHC in 08/2015 showed normal filling pressures; c. 10/2018 Echo (in setting of atrial tachycardia): EF 25-30%, impaired relaxation. Glob HK. RVSP 88mmHg. Mod dil RA/LA. Mech MV w/ nl fxn (mean grad 78mmHg). Mod TR.   Marland Kitchen Chronic kidney disease   . Compression fracture    "several; all in my back" (04/10/2016)  . GERD (gastroesophageal reflux disease)   . Hypertension   . Lymphedema   . Pacemaker 05/2017  . PAF (paroxysmal atrial fibrillation) ([place])    a. On amio & Coumadin - CHA2DS2VASc = 4.  . S/P MVR (mitral valve replacement)    a. MVR 1994 b. redo MVR in 08/2014 - on Coumadin  . Shortness of breath dyspnea     Medications:  Scheduled:  . amiodarone  200 mg Oral Daily  . cholecalciferol  2,000 Units Oral Daily  . feeding supplement (ENSURE ENLIVE)  237 mL Oral BID BM  . furosemide  80 mg Intravenous Q12H  .  polyethylene glycol  17 g Oral Daily  . potassium chloride SA  20 mEq Oral Daily  . sodium chloride flush  3 mL Intravenous Q12H  . vitamin C  500 mg Oral Daily  . Warfarin - Pharmacist Dosing Inpatient   Does not apply q1800  . zinc sulfate  220 mg Oral Daily    Assessment: Pt was on warfarin previously for atrial fibrillation with mechanical mitral valve. On admission patient's INR was 3.1 (up to 3.6 today). Pt is on amiodarone which can increase INR. At home patient takes 4 mg daily- she is a resident at [facility] in [place]. I called the facility to confirm the dose changed from prior admission.   Today's INR is therapeutic.    --DDI with amiodarone (home med)   6/5 INR 3.1 6/6 INR 3.6  HELD 6/7 INR 3.0 (3 mg given) 6/8 INR 2.6  Goal of Therapy:  INR 2-3 (per cardiology on previous visit d/t bleed during that admission, even with valve). Monitor platelets by anticoagulation protocol: Yes   Plan:  Will order warfarin 4 mg x 1  and repeat INR in AM.   Rowland Lathe, PharmD 12/07/2018,8:52 AM

## 2018-12-07 NOTE — TOC Initial Note (Signed)
Transition of Care [facility]) - Initial/Assessment Note    Patient Details  Name: [name] MRN: [id number] Date of Birth: 1934-07-03  Transition of Care [facility]) CM/SW Contact:    Elza Rafter, RN Phone Number: 12/07/2018, 1:54 PM  Clinical Narrative:     Patient recently discharged to [facility] from [facility] for [facility].  The plan will be to return there once medically clear.   Currently on 3L oxygen which is baseline for her.  Denies diffuculties obtaining medications or with accessing medical care.  Will complete FL2; spoke with Lawerance Sabal at [facility] and they can take her back when ready.               Expected Discharge Plan: [place] Barriers to Discharge: Continued Medical Work up   Patient Goals and CMS Choice Patient states their goals for this hospitalization and ongoing recovery are:: Return to [facility] for [facility] and eventually return home CMS Medicare.gov Compare Post Acute Care list provided to:: Patient Choice offered to / list presented to : Patient  Expected Discharge Plan and Services Expected Discharge Plan: [place]   Discharge Planning Services: CM Consult Post Acute Care Choice: [place] Living arrangements for the past 2 months: Skilled Nursing Facility(for the last week)                                      Prior Living Arrangements/Services Living arrangements for the past 2 months: Skilled Nursing Facility(for the last week) Lives with:: Adult Children Patient language and need for interpreter reviewed:: Yes              Criminal Activity/Legal Involvement Pertinent to Current Situation/Hospitalization: No - Comment as needed  Activities of Daily Living Home Assistive Devices/Equipment: Eyeglasses, Oxygen, Walker (specify type) ADL Screening (condition at time of admission) Patient's cognitive ability adequate to safely complete daily activities?: Yes Is the patient deaf or have  difficulty hearing?: No Does the patient have difficulty seeing, even when wearing glasses/contacts?: Yes Does the patient have difficulty concentrating, remembering, or making decisions?: No Patient able to express need for assistance with ADLs?: Yes Does the patient have difficulty dressing or bathing?: Yes Independently performs ADLs?: Yes (appropriate for developmental age) Does the patient have difficulty walking or climbing stairs?: Yes Weakness of Legs: None Weakness of Arms/Hands: Both  Permission Sought/Granted Permission sought to share information with : Facility Art therapist granted to share information with : Yes, Verbal Permission Granted  Share Information with NAME: Lawerance Sabal with [facility] STR           Emotional Assessment Appearance:: Appears stated age Attitude/Demeanor/Rapport: Gracious Affect (typically observed): Accepting Orientation: : Oriented to Self, Oriented to Place, Oriented to  Time, Oriented to Situation Alcohol / Substance Use: Not Applicable    Admission diagnosis:  Acute Heart Failure Patient Active Problem List   Diagnosis Date Noted  . Persistent atrial fibrillation   . Atrial tachycardia ([place])   . Chronic kidney disease (CKD), stage IV (severe) ([place])   . Chest pain of uncertain etiology   . Traumatic hematoma of left lower leg 11/09/2018  . Acute exacerbation of CHF (congestive heart failure) ([place]) 11/04/2018  . Pulmonary hypertension, unspecified ([place]) 11/04/2018  . S/P MVR (mitral valve replacement) 11/04/2018  . Encounter for therapeutic drug monitoring 09/09/2017  . Encounter for anticoagulation discussion and counseling 09/09/2017  .  Paroxysmal atrial fibrillation ([place]) 02/25/2017  . Chronic venous insufficiency 10/27/2016  . Shortness of breath 08/14/2016  . Pressure injury of skin 04/12/2016  . Malnutrition of moderate degree 04/11/2016  . Vertebral compression fracture ([place]) 04/10/2016  . Intractable  pain 04/10/2016  . Spinal compression fracture ([place]) 04/10/2016  . Acute on chronic HFrEF (heart failure with reduced ejection fraction) ([place]) 11/19/2015  . Lymphedema   . CKD (chronic kidney disease), stage III ([place])   . Bilateral lower extremity edema 08/30/2015  . Acute on chronic renal failure ([place]) 04/28/2015  . Hyponatremia 04/28/2015  . History of mitral valve replacement with mechanical valve 11/04/2014  . Long term current use of anticoagulant therapy 09/28/2014  . 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 Generalized OA 11/28/2013  . HLD (hyperlipidemia) 11/28/2013   PCP:  Rusty Aus, MD Pharmacy:   [place], [place] - [address] [place] [address] Phone: [phone] Fax: [phone]     Social Determinants of Health (SDOH) Interventions    Readmission Risk Interventions Readmission Risk Prevention Plan 12/07/2018 11/30/2018 11/09/2018  Transportation Screening Complete Complete Complete  PCP or Specialist Appt within 3-5 Days Complete - Complete  HRI or Home Care Consult Complete - Complete  Social Work Consult for [place] Planning/Counseling Not Complete - Not Complete  SW consult not completed comments NA - na  Palliative Care Screening Not Applicable - Not Applicable  Medication Review (RN Care Manager) Complete Complete Complete  PCP or Specialist appointment within 3-5 days of discharge - Complete -  [place] or [place] - Complete -  SW Recovery Care/Counseling Consult - Not Complete -  SW Consult Not Complete Comments - na -  Palliative Care Screening - Not Applicable -  [place] - Complete -  Some recent data might be hidden

## 2018-12-07 NOTE — NC FL2 (Signed)
[place] LEVEL OF CARE SCREENING TOOL     IDENTIFICATION  Patient Name: [name] Birthdate: 11/03/1934 Sex: female Admission Date (Current Location): 12/04/2018  [place] and [place] Number:  Engineering geologist and Address:  [facility], [address], [place], [place] [address]      Provider Number: [id number]  Attending Physician Name and Address:  Sela Hua, MD  Relative Name and Phone Number:  [name],[name] Daughter   [phone] or Jada, Fass   [phone]     Current Level of Care: Hospital Recommended Level of Care: [place] Prior Approval Number:    Date Approved/Denied:   PASRR Number: [id number]  Discharge Plan: SNF    Current Diagnoses: Patient Active Problem List   Diagnosis Date Noted  . Persistent atrial fibrillation   . Atrial tachycardia ([place])   . Chronic kidney disease (CKD), stage IV (severe) ([place])   . Chest pain of uncertain etiology   . Traumatic hematoma of left lower leg 11/09/2018  . Acute exacerbation of CHF (congestive heart failure) ([place]) 11/04/2018  . Pulmonary hypertension, unspecified ([place]) 11/04/2018  . S/P MVR (mitral valve replacement) 11/04/2018  . Encounter for therapeutic drug monitoring 09/09/2017  . Encounter for anticoagulation discussion and counseling 09/09/2017  . Paroxysmal atrial fibrillation ([place]) 02/25/2017  . Chronic venous insufficiency 10/27/2016  . Shortness of breath 08/14/2016  . Pressure injury of skin 04/12/2016  . Malnutrition of moderate degree 04/11/2016  . Vertebral compression fracture ([place]) 04/10/2016  . Intractable pain 04/10/2016  . Spinal compression fracture ([place]) 04/10/2016  . Acute on chronic HFrEF (heart failure with reduced ejection fraction) ([place]) 11/19/2015  . Lymphedema   . CKD (chronic kidney disease), stage III ([place])   . Bilateral lower extremity edema 08/30/2015  . Acute on chronic renal failure ([place]) 04/28/2015   . Hyponatremia 04/28/2015  . History of mitral valve replacement with mechanical valve 11/04/2014  . Long term current use of anticoagulant therapy 09/28/2014  . 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 Generalized OA 11/28/2013  . HLD (hyperlipidemia) 11/28/2013    Orientation RESPIRATION BLADDER Height & Weight     Self, Time, Situation, Place  O2 Incontinent Weight: 64.8 kg Height:  4\' 11"  (149.9 cm)  BEHAVIORAL SYMPTOMS/MOOD NEUROLOGICAL BOWEL NUTRITION STATUS      Incontinent Diet(2gm sodium)  AMBULATORY STATUS COMMUNICATION OF NEEDS Skin   Limited Assist Verbally Surgical wounds                       Personal Care Assistance Level of Assistance  Bathing, Feeding, Dressing Bathing Assistance: Limited assistance Feeding assistance: Limited assistance Dressing Assistance: Limited assistance     Functional Limitations Info  Sight, Hearing, Speech Sight Info: Adequate Hearing Info: Adequate Speech Info: Adequate    SPECIAL CARE FACTORS FREQUENCY  PT (By licensed PT)     PT Frequency: 5 X week OT Frequency:  5 X week            Contractures      Additional Factors Info  Code Status, Allergies Code Status Info: Full Allergies Info: Ace Inhibitors, Doxycycline, Hydrocodone, Mercury, Silver, Cephalexin, Clindamycin/lincomycin, Nickel, Penicillins           Current Medications (12/07/2018):  This is the current hospital active medication list Current Facility-Administered Medications  Medication Dose Route Frequency Provider Last Rate Last Dose  . 0.9 %  sodium chloride infusion  250 mL Intravenous PRN Seals, [name] Dills, NP      .  acetaminophen (TYLENOL) tablet 650 mg  650 mg Oral Q4H PRN Mayer Camel, NP   650 mg at 12/05/18 2127  . albuterol (PROVENTIL) (2.5 MG/3ML) 0.083% nebulizer solution 2.5 mg  2.5 mg Nebulization Q6H PRN Seals, Angela H, NP   2.5 mg at 12/05/18 2230  . ALPRAZolam Duanne Moron) tablet 0.25 mg  0.25 mg Oral QHS PRN Seals, [name] Dills,  NP      . amiodarone (PACERONE) tablet 200 mg  200 mg Oral Daily Seals, Angela H, NP   200 mg at 12/07/18 0945  . carvedilol (COREG) tablet 3.125 mg  3.125 mg Oral BID WC Wellington Hampshire, MD      . cholecalciferol (VITAMIN D) tablet 2,000 Units  2,000 Units Oral Daily Seals, [name] Dills, NP   2,000 Units at 12/07/18 0945  . feeding supplement (ENSURE ENLIVE) (ENSURE ENLIVE) liquid 237 mL  237 mL Oral BID BM Mayo, Pete Pelt, MD   237 mL at 12/07/18 0953  . furosemide (LASIX) injection 80 mg  80 mg Intravenous Q12H Mayo, Pete Pelt, MD   80 mg at 12/07/18 0946  . ipratropium-albuterol (DUONEB) 0.5-2.5 (3) MG/3ML nebulizer solution 3 mL  3 mL Nebulization Q6H PRN Seals, Levada Dy H, NP      . losartan (COZAAR) tablet 12.5 mg  12.5 mg Oral Daily Kathlyn [place] A, MD   12.5 mg at 12/07/18 1256  . ondansetron (ZOFRAN) injection 4 mg  4 mg Intravenous Q6H PRN Seals, Levada Dy H, NP      . oxyCODONE (Oxy IR/ROXICODONE) immediate release tablet 2.5 mg  2.5 mg Oral Q6H PRN Seals, Levada Dy H, NP   2.5 mg at 12/07/18 1253  . polyethylene glycol (MIRALAX / GLYCOLAX) packet 17 g  17 g Oral Daily Seals, Angela H, NP      . potassium chloride SA (K-DUR) CR tablet 20 mEq  20 mEq Oral Daily Seals, Angela H, NP   20 mEq at 12/07/18 0944  . sodium chloride flush (NS) 0.9 % injection 3 mL  3 mL Intravenous Q12H Seals, Angela H, NP   3 mL at 12/07/18 0953  . sodium chloride flush (NS) 0.9 % injection 3 mL  3 mL Intravenous PRN Seals, Levada Dy H, NP      . vitamin C (ASCORBIC ACID) tablet 500 mg  500 mg Oral Daily Seals, Angela H, NP   500 mg at 12/07/18 0944  . warfarin (COUMADIN) tablet 4 mg  4 mg Oral ONCE-1800 Rowland Lathe, RPH      . Warfarin - Pharmacist Dosing Inpatient   Does not apply q1800 Mayo, Pete Pelt, MD      . zinc sulfate capsule 220 mg  220 mg Oral Daily Seals, [name] Dills, NP   220 mg at 12/07/18 0945     Discharge Medications: Please see discharge summary for a list of discharge medications.  Relevant  Imaging Results:  Relevant Lab Results:   Additional Information SS#: [id number]  Elza Rafter, RN

## 2018-12-07 NOTE — Plan of Care (Signed)
  Problem: Clinical Measurements: Goal: Respiratory complications will improve Outcome: Progressing   Problem: Activity: Goal: Risk for activity intolerance will decrease Outcome: Progressing   Problem: Skin Integrity: Goal: Risk for impaired skin integrity will decrease Outcome: Progressing

## 2018-12-08 ENCOUNTER — Inpatient Hospital Stay: Payer: Medicare Other

## 2018-12-08 DIAGNOSIS — Z952 Presence of prosthetic heart valve: Secondary | ICD-10-CM

## 2018-12-08 DIAGNOSIS — J9621 Acute and chronic respiratory failure with hypoxia: Secondary | ICD-10-CM

## 2018-12-08 LAB — BASIC METABOLIC PANEL
Anion gap: 10 (ref 5–15)
BUN: 21 mg/dL (ref 8–23)
CO2: 31 mmol/L (ref 22–32)
Calcium: 9 mg/dL (ref 8.9–10.3)
Chloride: 99 mmol/L (ref 98–111)
Creatinine, Ser: 1.14 mg/dL — ABNORMAL HIGH (ref 0.44–1.00)
GFR calc Af Amer: 52 mL/min — ABNORMAL LOW (ref >60)
GFR calc non Af Amer: 45 mL/min — ABNORMAL LOW (ref >60)
Glucose, Bld: 113 mg/dL — ABNORMAL HIGH (ref 70–99)
Potassium: 3.5 mmol/L (ref 3.5–5.1)
Sodium: 140 mmol/L (ref 135–145)

## 2018-12-08 LAB — PROTIME-INR
INR: 2.5 — ABNORMAL HIGH (ref 0.8–1.2)
Prothrombin Time: 26.3 seconds — ABNORMAL HIGH (ref 11.4–15.2)

## 2018-12-08 MED ORDER — ALBUMIN HUMAN 25 % IV SOLN
12.5000 g | Freq: Once | INTRAVENOUS | Status: AC
  Administered 2018-12-08: 12.5 g via INTRAVENOUS
  Filled 2018-12-08: qty 50

## 2018-12-08 MED ORDER — WARFARIN SODIUM 4 MG PO TABS
4.0000 mg | ORAL_TABLET | Freq: Once | ORAL | Status: AC
  Administered 2018-12-08: 4 mg via ORAL
  Filled 2018-12-08: qty 1

## 2018-12-08 MED ORDER — FUROSEMIDE 10 MG/ML IJ SOLN
40.0000 mg | Freq: Once | INTRAMUSCULAR | Status: AC
  Administered 2018-12-08: 40 mg via INTRAVENOUS

## 2018-12-08 NOTE — Progress Notes (Signed)
Changed kerlex on right leg and reapplied ace bandage. Bruising noted on upper leg.

## 2018-12-08 NOTE — Progress Notes (Signed)
Progress Note  Patient Name: [name] Date of Encounter: 12/08/2018  Primary Cardiologist: Ida Rogue, MD   Subjective   Breathing slightly improved.  No chest pain.  Some leg edema still present.  Daughter is at the bedside and is concerned about hypoxia present since prior hospitalization.  Inpatient Medications    Scheduled Meds: . amiodarone  200 mg Oral Daily  . carvedilol  3.125 mg Oral BID WC  . cholecalciferol  2,000 Units Oral Daily  . feeding supplement (ENSURE ENLIVE)  237 mL Oral BID BM  . furosemide  80 mg Intravenous Q12H  . losartan  12.5 mg Oral Daily  . polyethylene glycol  17 g Oral Daily  . potassium chloride SA  20 mEq Oral Daily  . sodium chloride flush  3 mL Intravenous Q12H  . vitamin C  500 mg Oral Daily  . Warfarin - Pharmacist Dosing Inpatient   Does not apply q1800  . zinc sulfate  220 mg Oral Daily   Continuous Infusions: . sodium chloride     PRN Meds: sodium chloride, acetaminophen, albuterol, ALPRAZolam, ipratropium-albuterol, ondansetron (ZOFRAN) IV, oxyCODONE, sodium chloride flush   Vital Signs    Vitals:   12/07/18 1739 12/07/18 1951 12/08/18 0308 12/08/18 0744  BP: (!) 105/51 (!) 108/51 122/61 (!) 101/51  Pulse: 73 64 60 69  Resp: 18   18  Temp: (!) 97.3 F (36.3 C) 97.7 F (36.5 C) 97.6 F (36.4 C) 98 F (36.7 C)  TempSrc: Oral Oral Oral Oral  SpO2: (!) 88% 95% 91% 92%  Weight:   64 kg   Height:        Intake/Output Summary (Last 24 hours) at 12/08/2018 1017 Last data filed at 12/08/2018 0300 Gross per 24 hour  Intake 0 ml  Output 1250 ml  Net -1250 ml   Last 3 Weights 12/08/2018 12/07/2018 12/06/2018  Weight (lbs) 141 lb 3.2 oz 142 lb 12.8 oz 144 lb 8 oz  Weight (kg) 64.048 kg 64.774 kg 65.545 kg  Some encounter information is confidential and restricted. Go to Review Flowsheets activity to see all data.      Telemetry    AV-paced - Personally Reviewed  ECG    No new tracing.  Physical Exam   GEN: No  acute distress.  Daughter is at the bedside. Neck: JVP ~8-10 cm Cardiac: RRR with 2/6 systolic murmur. Respiratory: Coarse breath sounds with bibasilar crackles. GI: Soft, nontender, non-distended  MS: No edema; No deformity.  Both calves are wrapped with ACE bandages Neuro:  Nonfocal  Psych: Normal affect   Labs    Chemistry Recent Labs  Lab 12/04/18 1627 12/04/18 1954  12/06/18 0442 12/07/18 0453 12/08/18 0904  NA 141 139   < > 141 139 140  K 4.8 4.0   < > 3.7 4.0 3.5  CL 100 99   < > 102 99 99  CO2 30 30   < > 30 34* 31  GLUCOSE 94 98   < > 95 101* 113*  BUN 25* 24*   < > 25* 21 21  CREATININE 1.20* 1.23*   < > 1.30* 1.22* 1.14*  CALCIUM 9.2 8.9   < > 8.7* 8.8* 9.0  PROT 6.6 6.2*  --   --   --   --   ALBUMIN 3.7 3.5  --   --   --   --   AST 21 20  --   --   --   --  ALT 15 15  --   --   --   --   ALKPHOS 92 89  --   --   --   --   BILITOT 1.0 0.9  --   --   --   --   GFRNONAA 42* 41*   < > 38* 42* 45*  GFRAA 49* 48*   < > 45* 48* 52*  ANIONGAP 11 10   < > 9 6 10    < > = values in this interval not displayed.     Hematology Recent Labs  Lab 12/04/18 1954 12/06/18 0442 12/07/18 0453  WBC 5.5 4.1 6.3  RBC 3.44* 3.10* 3.40*  HGB 9.2* 8.0* 8.8*  HCT 30.6* 27.6* 30.1*  MCV 89.0 89.0 88.5  MCH 26.7 25.8* 25.9*  MCHC 30.1 29.0* 29.2*  RDW 16.2* 16.4* 16.4*  PLT 206 171 199    Cardiac Enzymes Recent Labs  Lab 12/04/18 1954 12/05/18 0258 12/05/18 0902  TROPONINI 0.05* 0.04* 0.04*   No results for input(s): TROPIPOC in the last 168 hours.   BNP Recent Labs  Lab 12/04/18 1627 12/04/18 1954  BNP 1,189.0* 992.0*     DDimer No results for input(s): DDIMER in the last 168 hours.   Radiology    Dg Chest 1 View  Result Date: 12/08/2018 CLINICAL DATA:  Shortness of breath EXAM: CHEST  1 VIEW COMPARISON:  12/07/2018 FINDINGS: Prior median sternotomy and valve replacement. Left pacer remains in place, unchanged. Cardiomegaly with vascular congestion.  Diffuse interstitial and airspace opacities lung with small effusions again noted. No real change. IMPRESSION: No significant change since prior study. Electronically Signed   By: Rolm Baptise M.D.   On: 12/08/2018 08:50   Dg Chest 1 View  Result Date: 12/07/2018 CLINICAL DATA:  Shortness of breath EXAM: CHEST  1 VIEW COMPARISON:  12/06/2018 FINDINGS: Cardiac shadow is enlarged but stable. Pacing device and postsurgical changes are again seen. Small effusions are again seen and stable. Patchy infiltrates are again noted right greater than left but stable from the prior exam. No new focal abnormality is noted. IMPRESSION: Stable airspace opacities right greater than left with associated small effusions. Electronically Signed   By: Inez Catalina M.D.   On: 12/07/2018 07:30    Cardiac Studies   2D Echo 11/05/2018: 1. The left ventricle has severely reduced systolic function, with an ejection fraction of 25-30%. The cavity size was mildly dilated. Left ventricular diastolic Doppler parameters are consistent with impaired relaxation. Left ventrical global hypokinesis without regional wall motion abnormalities. 2. The right ventricle has mildly reduced systolic function. The cavity was moderately enlarged. There is no increase in right ventricular wall thickness. Right ventricular systolic pressure is moderately elevated with an estimated pressure of 53 mmHg. 3. Right atrial size was moderately dilated. 4. Left atrial size was moderately dilated. 5. The aortic valve was not well visualized. Moderate calcification of the aortic valve. Aortic valve regurgitation was not assessed by color flow Doppler. Unable to estimate adegree of aortic valve stenosis given severely reduced systolic function. 6. Mechanical mitral valve. Mean gradient 5 mm Hg. 7. Tricuspid valve regurgitation is moderate. 8. Left pleural effusion noted, 6.5 cm 9. Rhythm is atrial tachycardia, rate 105 bpm  Patient Profile     84  y.o. female with history of persistent atrial fibrillation status post cardioversion (most recently Nov 05, 4965), chronic systolic congestive heart failure status post CRT, mechanical mitral valve replacement on chronic Coumadin, mild aortic stenosis,Stage IV chronic  kidney disease, COPD on home O2 and recent prolonged hospitalization for  spontaneous left calf hematoma and acute blood loss anemia who was admitted from clinic due to acute on chronic systolic heart failure with volume overload and hypoxia.  Assessment & Plan    Acute on chronic systolic heart failure: Patient reports continued improvement in breathing.  I suspect that she is still mildly volume overloaded, given JVD and crackles on lung exam.  BP remains soft but is tolerating low-dose carvedilol and losartan.  Creatinine has improved with continued diuresis.  Continue furosemide 80 mg IV BID.  Continue current doses of carvedilol and losartan.  Acute on chronic respiratory failure with hypoxia: Patient reports improvement in shortness of breath.  Continue gentle diuresis until patient appears clinically euvolemic and/or creatinine begins to rise.  Wean oxygen as tolerated.  If significant hypoxia persists despite adequate diuresis, pulmonary consultation may be helpful.  History of mitral valve replacement: INR therapeutic at 2.5 today.  Continue warfarin per pharmacy.  For questions or updates, please contact [place] Please consult [email or web address] for contact info under [facility].    Signed, Nelva Bush, MD  12/08/2018, 10:17 AM

## 2018-12-08 NOTE — Progress Notes (Signed)
Patient seen yesterday during dressing change.   Marked improvement in bilateral lower extremity edema from 4 days earlier. Left leg site shows excellent incorporation of Acell graft. Very clean, odor free. The patient was originally scheduled for a full dressing change next week, will post pone until June 18 in office.

## 2018-12-08 NOTE — Progress Notes (Addendum)
OT Cancellation Note  Patient Details Name: [name] MRN: [id number] DOB: [birth year]   Cancelled Treatment:    Reason Eval/Treat Not Completed: Other (comment). Upon attempt for OT treatment, pt with PT. Will re-attempt at later date/time as pt is available.  Jeni Salles, MPH, MS, OTR/L ascom [phone] 12/08/18, 11:27 AM

## 2018-12-08 NOTE — Progress Notes (Signed)
Physical Therapy Treatment Patient Details Name: [name] MRN: [id number] DOB: 1934/10/10 Today's Date: 12/08/2018    History of Present Illness [name] is an 52yoF who comes to [facility] from cardiology after a/c exacerbation of sCHF. Pt has been at [facility] for <1W s/p DC from this site for a similar problem. PMH: AF, chronic back pain, combined CHF, CKD, compression fracture of spine, PPM, MR, pulmonary HTN, LEE, chronic O2 use 3L, recently 4-6L.     PT Comments    Patient is a pleasant 84 year old female who was in bed upon PT arrival. Patient agreeable to participate in physical therapy stating "I want to walk". Patient transferred to sitting EOB maintaining Sp02>90, assisted in doffing socks and donning walking shoes. Patient's oxygen increased to 5L for ambulation, ambulated to door and back x2 trials with standing rest breaks to increase Sp02 due to dropping. Patient increased levels with increased focus on breathing in through nose and out through mouth. Patient agreeable to seated interventions and supine after ambulation and performed with good motivation. Patient's vitals monitored with occasional need for rest breaks to return to therapeutic levels. Patient returned to bed and stated she wanted to take a nap. Nursing notified. Patient's current POC remains appropriate at this time.    Follow Up Recommendations  SNF(return to [facility] so she can complete her STR stay)     Equipment Recommendations  None recommended by PT    Recommendations for Other Services       Precautions / Restrictions Precautions Precautions: Fall Precaution Comments: Per Dr Bary Castilla, pt has no weight bearing precautions Restrictions Weight Bearing Restrictions: No Other Position/Activity Restrictions: Per Dr. Bary Castilla, pt has no weight bearing precautions    Mobility  Bed Mobility Overal bed mobility: Needs Assistance Bed Mobility: Supine to Sit;Sit to Supine     Supine to  sit: Supervision Sit to supine: Min guard;Min assist   General bed mobility comments: required Min A under heels to return to bed  Transfers Overall transfer level: Needs assistance Equipment used: Rolling walker (2 wheeled) Transfers: Sit to/from Stand Sit to Stand: Mod assist;From elevated surface;Min assist         General transfer comment: posterior lean with STS, able to perform with cueing for forward weight shift  Ambulation/Gait Ambulation/Gait assistance: Min guard Gait Distance (Feet): 30 Feet Assistive device: Rolling walker (2 wheeled) Gait Pattern/deviations: Step-through pattern;Decreased step length - right;Decreased step length - left;Trunk flexed Gait velocity: decreased   General Gait Details: ambulated to door and back x2 trials, didn't want to go outside but eager to ambulate in room, 02 raised to 5L , Sp02 dropped to 83% but increased back to 88% with verbal cueing to breathe through nose.    Stairs             Wheelchair Mobility    Modified Rankin (Stroke Patients Only)       Balance Overall balance assessment: Needs assistance Sitting-balance support: Feet supported Sitting balance-Leahy Scale: Fair Sitting balance - Comments: posterior trunk lean with LE movement Postural control: Posterior lean Standing balance support: Bilateral upper extremity supported Standing balance-Leahy Scale: Poor Standing balance comment: requires RW for UE support                             Cognition Arousal/Alertness: Awake/alert Behavior During Therapy: WFL for tasks assessed/performed Overall Cognitive Status: Within Functional Limits for tasks assessed  General Comments: eager to ambulate       Exercises General Exercises - Lower Extremity Ankle Circles/Pumps: AROM;Both;10 reps;Supine Short Arc Quad: Strengthening;Both;10 reps;Supine Long Arc Quad: Strengthening;Both;10  reps;Seated Straight Leg Raises: AAROM;Strengthening;Both;10 reps Hip Flexion/Marching: Strengthening;Both;10 reps;Seated Other Exercises Other Exercises: patient performed seated and supine interventions after ambulation, requiring continued cueing for breathing in through nose to maintain Sp02>90.     General Comments General comments (skin integrity, edema, etc.): BLE wraps in place, elevated      Pertinent Vitals/Pain Pain Assessment: 0-10 Pain Score: 3  Pain Location: Shoulder and LLE hemotoma site>RLE Pain Descriptors / Indicators: Aching Pain Intervention(s): Limited activity within patient's tolerance;Monitored during session;Repositioned    Home Living                      Prior Function            PT Goals (current goals can now be found in the care plan section) Acute Rehab PT Goals Patient Stated Goal: to walk and get back to my old routines PT Goal Formulation: With patient Time For Goal Achievement: 12/19/18 Potential to Achieve Goals: Fair Progress towards PT goals: Progressing toward goals    Frequency    Min 2X/week      PT Plan Current plan remains appropriate    Co-evaluation              AM-PAC PT "6 Clicks" Mobility   Outcome Measure  Help needed turning from your back to your side while in a flat bed without using bedrails?: A Little Help needed moving from lying on your back to sitting on the side of a flat bed without using bedrails?: A Little Help needed moving to and from a bed to a chair (including a wheelchair)?: A Lot Help needed standing up from a chair using your arms (e.g., wheelchair or bedside chair)?: A Lot Help needed to walk in hospital room?: A Little Help needed climbing 3-5 steps with a railing? : A Little 6 Click Score: 16    End of Session Equipment Utilized During Treatment: Gait belt;Oxygen Activity Tolerance: Patient tolerated treatment well;Patient limited by fatigue;No increased pain;Treatment  limited secondary to medical complications (Comment)(SpO2 drop with ambulation, requiring seated rest break and cueing to return ) Patient left: with call bell/phone within reach;in chair;with chair alarm set Nurse Communication: Mobility status;Other (comment)(ambulation duration increase) PT Visit Diagnosis: Other abnormalities of gait and mobility (R26.89);Muscle weakness (generalized) (M62.81);Difficulty in walking, not elsewhere classified (R26.2);Pain     Time: 9326-7124 PT Time Calculation (min) (ACUTE ONLY): 24 min  Charges:  $Gait Training: 8-22 mins $Therapeutic Exercise: 8-22 mins                     Janna Arch, PT, DPT     Janna Arch 12/08/2018, 11:50 AM

## 2018-12-08 NOTE — Plan of Care (Signed)
  Problem: Education: Goal: Knowledge of General Education information will improve Description Including pain rating scale, medication(s)/side effects and non-pharmacologic comfort measures Outcome: Progressing   Problem: Clinical Measurements: Goal: Will remain free from infection Outcome: Progressing Goal: Diagnostic test results will improve Outcome: Progressing Goal: Respiratory complications will improve Outcome: Progressing Goal: Cardiovascular complication will be avoided Outcome: Progressing   Problem: Activity: Goal: Risk for activity intolerance will decrease Outcome: Not Progressing Note:  Encourage patient to engage in self care.

## 2018-12-08 NOTE — Progress Notes (Signed)
Pt and pts daughter request this RN to redo dressing- new dressing applied per order with pts daughter at bedside/ tolerated well.

## 2018-12-08 NOTE — Progress Notes (Signed)
[place] at [place] NAME: [name]    MR#:  [id number]  DATE OF BIRTH:  03-09-1935  SUBJECTIVE:   Patient states she is feeling okay today.  Her shortness of breath is still little bit worse than baseline.  She is nervous to go home because she is afraid she will become short of breath and have to come back to the hospital.  She states that her leg pain is improving.  REVIEW OF SYSTEMS:  Review of Systems  Constitutional: Positive for malaise/fatigue. Negative for chills, fever and weight loss.  HENT: Negative for congestion, hearing loss and sore throat.   Eyes: Negative for blurred vision and double vision.  Respiratory: Negative for cough, shortness of breath and wheezing.   Cardiovascular: Positive for leg swelling. Negative for chest pain, palpitations and orthopnea.  Gastrointestinal: Negative for abdominal pain, diarrhea, nausea and vomiting.  Genitourinary: Negative for dysuria and urgency.  Musculoskeletal: Negative for myalgias.  Skin: Negative for rash.  Neurological: Negative for dizziness, sensory change, speech change, focal weakness and headaches.  Psychiatric/Behavioral: Negative for depression.   DRUG ALLERGIES:   Allergies  Allergen Reactions  . Ace Inhibitors     Cough  . Doxycycline Nausea Only  . Hydrocodone Itching and Nausea Only  . Mercury     Other reaction(s): Unknown  . Silver Dermatitis    Severe itching  . Cephalexin Rash  . Clindamycin/Lincomycin Rash  . Nickel Rash  . Penicillins Rash    Has patient had a PCN reaction causing immediate rash, facial/tongue/throat swelling, SOB or lightheadedness with hypotension: YES Has patient had a PCN reaction causing severe rash involving mucus membranes or skin necrosis: NO Has patient had a PCN reaction that required hospitalization NO Has patient had a PCN reaction occurring within the last 10 years: NO If all of the above answers are "NO", then  may proceed with Cephalosporin use.   VITALS:  Blood pressure (!) 101/51, pulse 69, temperature 98 F (36.7 C), temperature source Oral, resp. rate 18, height 4\' 11"  (1.499 m), weight 64 kg, SpO2 92 %. PHYSICAL EXAMINATION:   GENERAL:  84 y.o.-year-old patient lying in the bed with no acute distress.  EYES: Pupils equal, round, reactive to light and accommodation. No scleral icterus. Extraocular muscles intact.  HEENT: Head atraumatic, normocephalic. Oropharynx and nasopharynx clear.  NECK:  Supple, no jugular venous distention. No thyroid enlargement, no tenderness.  LUNGS: + Diminished breath sounds in the lung bases bilaterally. No use of accessory muscles of respiration.  CARDIOVASCULAR: RRR, S1, S2 normal. Click, loud systolic murmur, no rubs, or gallops.  ABDOMEN: Soft, nontender, nondistended. Bowel sounds present. No organomegaly or mass.  EXTREMITIES: Bilateral pitting edema of lower extremities.  Ace wrap to left leg, no cyanosis, or clubbing. No rash or lesions. + pedal pulses MUSCULOSKELETAL: Normal bulk, and power was 5+ grip and elbow, knee, and ankle flexion and extension bilaterally.  NEUROLOGIC:Alert and oriented x 3. CN 2-12 intact. Sensation to light touch decreased in lower extremities Gait not tested due to safety concern. PSYCHIATRIC: The patient is alert and oriented x 3.  SKIN: dry ACE bandage in place over left leg.  DATA REVIEWED:  LABORATORY PANEL:  Female CBC Recent Labs  Lab 12/07/18 0453  WBC 6.3  HGB 8.8*  HCT 30.1*  PLT 199   ------------------------------------------------------------------------------------------------------------------ Chemistries  Recent Labs  Lab 12/04/18 1954  12/08/18 0904  NA 139   < > 140  K  4.0   < > 3.5  CL 99   < > 99  CO2 30   < > 31  GLUCOSE 98   < > 113*  BUN 24*   < > 21  CREATININE 1.23*   < > 1.14*  CALCIUM 8.9   < > 9.0  MG 2.2  --   --   AST 20  --   --   ALT 15  --   --   ALKPHOS 89  --   --    BILITOT 0.9  --   --    < > = values in this interval not displayed.   RADIOLOGY:  Dg Chest 1 View  Result Date: 12/08/2018 CLINICAL DATA:  Shortness of breath EXAM: CHEST  1 VIEW COMPARISON:  12/07/2018 FINDINGS: Prior median sternotomy and valve replacement. Left pacer remains in place, unchanged. Cardiomegaly with vascular congestion. Diffuse interstitial and airspace opacities lung with small effusions again noted. No real change. IMPRESSION: No significant change since prior study. Electronically Signed   By: Rolm Baptise M.D.   On: 12/08/2018 08:50   Dg Chest 1 View  Result Date: 12/07/2018 CLINICAL DATA:  Shortness of breath EXAM: CHEST  1 VIEW COMPARISON:  12/06/2018 FINDINGS: Cardiac shadow is enlarged but stable. Pacing device and postsurgical changes are again seen. Small effusions are again seen and stable. Patchy infiltrates are again noted right greater than left but stable from the prior exam. No new focal abnormality is noted. IMPRESSION: Stable airspace opacities right greater than left with associated small effusions. Electronically Signed   By: Inez Catalina M.D.   On: 12/07/2018 07:30   ASSESSMENT AND PLAN:   84 y.o. female with past medical history of persistent atrial fibrillation on Coumadin, chronic systolic heart failure status post CRT, mechanical MVR, mild aortic stenosis, CKD, COPD with chronic respiratory failure on home oxygen presenting as a direct admission from a cardiologist Dr. Rockey Situ office for acute exacerbation of combined CHF.  Acute on chronic combined systolic and diastolic CHF- improving. -Continue low dose coreg and losartan -Continue lasix 80mg  IV bid -Daily weights, strict I/O -Cardiology following  Paroxysmal atrial fibrillation + Mechanical MVR- AV paced. INR at goal. -Continue coumadin and amiodarone  Chronic respiratory failure secondary to COPD-  no signs of exacerbation. Currently on home 3L O2. -Duonebs prn -Pulmonology consult, per patient  request   Left lower extremity wound status post debridement of large hematoma -Dr. Bary Castilla following  Elevated troponin -likely demand ischemia. Troponins flat.   Hypertension- BPs a little soft -Continue low dose losartan and coreg   CKD III - creatinine better than baseline -Continue to monitor and avoid nephrotoxins  DVT prophylaxis- coumadin  Plan for discharge back to SNF.  All the records are reviewed and case discussed with Care Management/Social Worker. Management plans discussed with the patient, family and they are in agreement.  CODE STATUS: Full Code  TOTAL TIME TAKING CARE OF THIS PATIENT: 45 minutes.   More than 50% of the time was spent in counseling/coordination of care: YES  POSSIBLE D/C IN 1-2 DAYS, DEPENDING ON CLINICAL CONDITION.   on 12/08/2018 at 4:04 PM  Between 7am to 6pm - Pager - [phone]  After 6pm go to [email or web address] - Proofreader  [employer] [facility]  Office  [phone]  CC: Primary care physician; Rusty Aus, MD  Note: This dictation was prepared with Dragon dictation along with smaller phrase technology. Any transcriptional errors that result from this  process are unintentional.

## 2018-12-08 NOTE — Consult Note (Signed)
Pulmonary Medicine          Date: 12/08/2018,   MRN# [id number] [name] 29-Jul-1934     AdmissionWeight: 66.5 kg                 CurrentWeight: 64 kg      CHIEF COMPLAINT:   Worsening dyspnea with increased O2 requirement   HISTORY OF PRESENT ILLNESS   This is a pleasant 84 year old female well-known to our service she has a history of A. fib, systolic CHF with PPM, status post mitral valve replacement on anticoagulation, previous smoker for 30 years without COPD, recent large left lower extremity hematoma with surgical repair, osteoporotic, CKD who is accompanied by her daughter [name] at the bedside during my evaluation.  Patient states that she had come to the hospital due to worsening shortness of breath over the last week.  She uses oxygen for chronic hypoxemia at home currently 3 to 4 L/min nasal cannula and states that despite using home O2 she continue you to stay dyspneic with lower oxygen saturation.  She is recently undergone plastic surgery evaluation with graft placement on left lower extremity which has caused her to become more deconditioned and immobile.  On admission she was noted to have increased BNP at 1189 with most recent 505 in May 2020, she also had a chest imaging with x-ray showing significant pulmonary edema and pleural effusions.  Was asked to evaluate patient by hospitalist due to request of patient for pulmonary evaluation as well as persistent increased O2 requirement.  Blood work reveals CKD and chronic anemia without leukocytosis.  She denies having constitutional symptoms denies febrile episodes, denies flulike illness, denies sick contacts denies travel history and has been tested for novel coronavirus repeatedly over the last few months and has been negative.  PAST MEDICAL HISTORY   Past Medical History:  Diagnosis Date   Arthritis    "back, hands, knees" (04/10/2016)   Chronic back pain    Chronic combined systolic  (congestive) and diastolic (congestive) heart failure (HCC)    a. EF 25% by echo in 08/2015 b. RHC in 08/2015 showed normal filling pressures; c. 10/2018 Echo (in setting of atrial tachycardia): EF 25-30%, impaired relaxation. Glob HK. RVSP 70mmHg. Mod dil RA/LA. Mech MV w/ nl fxn (mean grad 22mmHg). Mod TR.    Chronic kidney disease    Compression fracture    "several; all in my back" (04/10/2016)   GERD (gastroesophageal reflux disease)    Hypertension    Lymphedema    Pacemaker 05/2017   PAF (paroxysmal atrial fibrillation) ([place])    a. On amio & Coumadin - CHA2DS2VASc = 4.   S/P MVR (mitral valve replacement)    a. MVR 1994 b. redo MVR in 08/2014 - on Coumadin   Shortness of breath dyspnea      SURGICAL HISTORY   Past Surgical History:  Procedure Laterality Date   BREAST LUMPECTOMY Right 2005?   benign lump excision   CARDIAC CATHETERIZATION     CARDIAC VALVE REPLACEMENT  1994, 2016   "MVR; MVR"   CARDIOVERSION N/A 11/06/2018   Procedure: CARDIOVERSION;  Surgeon: Minna Merritts, MD;  Location: [facility];  Service: Cardiovascular;  Laterality: N/A;   CATARACT EXTRACTION W/PHACO Right 02/20/2015   Procedure: CATARACT EXTRACTION PHACO AND INTRAOCULAR LENS PLACEMENT ([place]);  Surgeon: Estill Cotta, MD;  Location: [facility];  Service: Ophthalmology;  Laterality: Right;  US01:31.2 AP 25.3% CDE40.13 Fluid lot # [id number]   DRESSING  CHANGE UNDER ANESTHESIA Left 11/17/2018   Procedure: DRESSING CHANGE;  Surgeon: Robert Bellow, MD;  Location: [facility];  Service: General;  Laterality: Left;  no anesthesia   ELECTROPHYSIOLOGIC STUDY N/A 10/09/2015   Procedure: CARDIOVERSION;  Surgeon: Wellington Hampshire, MD;  Location: [facility];  Service: Cardiovascular;  Laterality: N/A;   INCISION AND DRAINAGE OF WOUND Left 11/13/2018   Procedure: LEFT LEG DRESSING CHANGE;  Surgeon: Robert Bellow, MD;  Location: [facility];  Service: General;  Laterality: Left;   INCISION AND  DRAINAGE OF WOUND Left 11/19/2018   Procedure: LEFT LEG DRESSING CHANGE AND GRAFT APPLICATION;  Surgeon: Robert Bellow, MD;  Location: [facility];  Service: General;  Laterality: Left;   IR GENERIC HISTORICAL  04/01/2016   IR RADIOLOGIST EVAL & MGMT 04/01/2016 [facility]   IR GENERIC HISTORICAL  04/15/2016   IR VERTEBROPLASTY CERV/THOR BX INC UNI/BIL INC/INJECT/IMAGING 04/15/2016 Luanne Bras, MD [facility]   IR GENERIC HISTORICAL  04/15/2016   IR VERTEBROPLASTY EA ADDL (T&LS) BX INC UNI/BIL INC INJECT/IMAGING 04/15/2016 Luanne Bras, MD [facility]   IR GENERIC HISTORICAL  04/15/2016   IR VERTEBROPLASTY EA ADDL (T&LS) BX INC UNI/BIL INC INJECT/IMAGING 04/15/2016 Luanne Bras, MD [facility]   IR GENERIC HISTORICAL  05/20/2016   IR RADIOLOGIST EVAL & MGMT 05/20/2016 [facility]   IR GENERIC HISTORICAL  06/13/2016   IR VERTEBROPLASTY EA ADDL (T&LS) BX INC UNI/BIL INC INJECT/IMAGING 06/13/2016 Luanne Bras, MD [facility]   IR GENERIC HISTORICAL  06/13/2016   IR SACROPLASTY BILATERAL 06/13/2016 Luanne Bras, MD [facility]   IR GENERIC HISTORICAL  06/13/2016   IR VERTEBROPLASTY LUMBAR BX INC UNI/BIL INC/INJECT/IMAGING 06/13/2016 Luanne Bras, MD [facility]   IRRIGATION AND DEBRIDEMENT HEMATOMA Left 07/05/2015   Procedure: IRRIGATION AND DEBRIDEMENT HEMATOMA;  Surgeon: Robert Bellow, MD;  Location: [facility];  Service: General;  Laterality: Left;   IRRIGATION AND DEBRIDEMENT HEMATOMA Left 11/11/2018   Procedure: IRRIGATION AND DEBRIDEMENT LEFT LEG HEMATOMA;  Surgeon: Robert Bellow, MD;  Location: [facility];  Service: General;  Laterality: Left;   pyloric stenosis  07/1937   US ECHOCARDIOGRAPHY       FAMILY HISTORY   Family History  Problem Relation Age of Onset   Stroke Mother    Renal Disease Father      SOCIAL HISTORY   Social History   Tobacco Use   Smoking status: Former Smoker    Packs/day: 1.00    Years:  27.00    Pack years: 27.00    Types: Cigarettes    Last attempt to quit: 07/01/1980    Years since quitting: 38.4   Smokeless tobacco: Never Used   Tobacco comment: "quit smoking ~ 1980  Substance Use Topics   Alcohol use: Not Currently    Alcohol/week: 0.0 standard drinks    Comment: 04/10/2016 "I'll have a drink on holidays/special occasions"   Drug use: No     MEDICATIONS    Home Medication:    Current Medication:  Current Facility-Administered Medications:    0.9 %  sodium chloride infusion, 250 mL, Intravenous, PRN, Seals, Angela H, NP   acetaminophen (TYLENOL) tablet 650 mg, 650 mg, Oral, Q4H PRN, Seals, Angela H, NP, 650 mg at 12/05/18 2127   albuterol (PROVENTIL) (2.5 MG/3ML) 0.083% nebulizer solution 2.5 mg, 2.5 mg, Nebulization, Q6H PRN, Seals, Angela H, NP, 2.5 mg at 12/05/18 2230   ALPRAZolam (XANAX) tablet 0.25 mg, 0.25 mg, Oral, QHS PRN, Seals, [name] Dills, NP  amiodarone (PACERONE) tablet 200 mg, 200 mg, Oral, Daily, Seals, Angela H, NP, 200 mg at 12/08/18 0932   carvedilol (COREG) tablet 3.125 mg, 3.125 mg, Oral, BID WC, Arida, Muhammad A, MD, 3.125 mg at 12/08/18 0909   cholecalciferol (VITAMIN D) tablet 2,000 Units, 2,000 Units, Oral, Daily, Seals, Angela H, NP, 2,000 Units at 12/08/18 0909   feeding supplement (ENSURE ENLIVE) (ENSURE ENLIVE) liquid 237 mL, 237 mL, Oral, BID BM, Mayo, Pete Pelt, MD, 237 mL at 12/07/18 1435   furosemide (LASIX) injection 80 mg, 80 mg, Intravenous, Q12H, Mayo, Pete Pelt, MD, 80 mg at 12/08/18 0909   ipratropium-albuterol (DUONEB) 0.5-2.5 (3) MG/3ML nebulizer solution 3 mL, 3 mL, Nebulization, Q6H PRN, Seals, Angela H, NP   losartan (COZAAR) tablet 12.5 mg, 12.5 mg, Oral, Daily, Arida, Muhammad A, MD, 12.5 mg at 12/08/18 0909   ondansetron (ZOFRAN) injection 4 mg, 4 mg, Intravenous, Q6H PRN, Seals, Angela H, NP   oxyCODONE (Oxy IR/ROXICODONE) immediate release tablet 2.5 mg, 2.5 mg, Oral, Q6H PRN, Seals, Angela H, NP,  2.5 mg at 12/08/18 0509   polyethylene glycol (MIRALAX / GLYCOLAX) packet 17 g, 17 g, Oral, Daily, Seals, Angela H, NP, 17 g at 12/08/18 6712   potassium chloride SA (K-DUR) CR tablet 20 mEq, 20 mEq, Oral, Daily, Seals, Angela H, NP, 20 mEq at 12/08/18 0909   sodium chloride flush (NS) 0.9 % injection 3 mL, 3 mL, Intravenous, Q12H, Seals, Angela H, NP, 3 mL at 12/08/18 0910   sodium chloride flush (NS) 0.9 % injection 3 mL, 3 mL, Intravenous, PRN, Seals, Angela H, NP   vitamin C (ASCORBIC ACID) tablet 500 mg, 500 mg, Oral, Daily, Seals, Angela H, NP, 500 mg at 12/08/18 4580   warfarin (COUMADIN) tablet 4 mg, 4 mg, Oral, ONCE-1800, Duncan, Asajah R, RPH   Warfarin - Pharmacist Dosing Inpatient, , Does not apply, q1800, Mayo, Pete Pelt, MD   zinc sulfate capsule 220 mg, 220 mg, Oral, Daily, Seals, Angela H, NP, 220 mg at 12/08/18 0909    ALLERGIES   Ace inhibitors; Doxycycline; Hydrocodone; Mercury; Silver; Cephalexin; Clindamycin/lincomycin; Nickel; and Penicillins     REVIEW OF SYSTEMS    Review of Systems:  Gen:  Denies  fever, sweats, chills weigh loss  HEENT: Denies blurred vision, double vision, ear pain, eye pain, hearing loss, nose bleeds, sore throat Cardiac:  No dizziness, chest pain or heaviness, chest tightness,edema Resp:   Denies cough or sputum porduction, shortness of breath,wheezing, hemoptysis,  Gi: Denies swallowing difficulty, stomach pain, nausea or vomiting, diarrhea, constipation, bowel incontinence Gu:  Denies bladder incontinence, burning urine Ext:   Denies Joint pain, stiffness or swelling Skin: Denies  skin rash, easy bruising or bleeding or hives Endoc:  Denies polyuria, polydipsia , polyphagia or weight change Psych:   Denies depression, insomnia or hallucinations   Other:  All other systems negative   VS: BP (!) 101/51 (BP Location: Left Arm)    Pulse 69    Temp 98 F (36.7 C) (Oral)    Resp 18    Ht 4\' 11"  (1.499 m)    Wt 64 kg    SpO2 92%     BMI 28.52 kg/m      PHYSICAL EXAM    GENERAL:NAD, no fevers, chills, no weakness no fatigue HEAD: Normocephalic, atraumatic.  EYES: Pupils equal, round, reactive to light. Extraocular muscles intact. No scleral icterus.  MOUTH: Moist mucosal membrane. Dentition intact. No abscess noted.  EAR, NOSE, THROAT: Clear without  exudates. No external lesions.  NECK: Supple. No thyromegaly. No nodules. No JVD.  PULMONARY: Bilateral pan-inspiratory crackles without rhonchorous breath sounds or wheezing  CARDIOVASCULAR: S1 and S2. Regular rate and rhythm. No murmurs, rubs, or gallops. No edema. Pedal pulses 2+ bilaterally.  GASTROINTESTINAL: Soft, nontender, nondistended. No masses. Positive bowel sounds. No hepatosplenomegaly.  MUSCULOSKELETAL: No swelling, clubbing, or edema. Range of motion full in all extremities.  NEUROLOGIC: Cranial nerves II through XII are intact. No gross focal neurological deficits. Sensation intact. Reflexes intact.  SKIN: No ulceration, lesions, rashes, or cyanosis. Skin warm and dry. Turgor intact.  PSYCHIATRIC: Mood, affect within normal limits. The patient is awake, alert and oriented x 3. Insight, judgment intact.       IMAGING    Dg Chest 1 View  Result Date: 12/08/2018 CLINICAL DATA:  Shortness of breath EXAM: CHEST  1 VIEW COMPARISON:  12/07/2018 FINDINGS: Prior median sternotomy and valve replacement. Left pacer remains in place, unchanged. Cardiomegaly with vascular congestion. Diffuse interstitial and airspace opacities lung with small effusions again noted. No real change. IMPRESSION: No significant change since prior study. Electronically Signed   By: Rolm Baptise M.D.   On: 12/08/2018 08:50   Dg Chest 1 View  Result Date: 12/07/2018 CLINICAL DATA:  Shortness of breath EXAM: CHEST  1 VIEW COMPARISON:  12/06/2018 FINDINGS: Cardiac shadow is enlarged but stable. Pacing device and postsurgical changes are again seen. Small effusions are again seen and  stable. Patchy infiltrates are again noted right greater than left but stable from the prior exam. No new focal abnormality is noted. IMPRESSION: Stable airspace opacities right greater than left with associated small effusions. Electronically Signed   By: Inez Catalina M.D.   On: 12/07/2018 07:30   Dg Chest 1 View  Result Date: 12/06/2018 CLINICAL DATA:  Shortness of breath.  Lower extremity edema. EXAM: CHEST  1 VIEW COMPARISON:  Chest radiograph 12/05/2018 FINDINGS: Multi lead pacer apparatus overlies the left hemithorax. Leads are stable in position. Stable cardiomegaly. Similar-appearing patchy areas of consolidation within the mid and lower lungs bilaterally. Bilateral small to moderate pleural effusions. No pneumothorax. IMPRESSION: Cardiomegaly. Moderate bilateral pleural effusions with underlying consolidation favored to represent combination of atelectasis and edema. Superimposed infection not excluded. Electronically Signed   By: Lovey Newcomer M.D.   On: 12/06/2018 06:44   Dg Chest 2 View  Result Date: 12/06/2018 CLINICAL DATA:  84 year old female with history of hepatocellular carcinoma. Hypertension and shortness of breath. EXAM: CHEST - 2 VIEW COMPARISON:  Chest x-ray 11/13/2018. FINDINGS: Lung volumes are low. Moderate bilateral pleural effusions. Bibasilar areas of atelectasis and/or consolidation. Cephalization of the pulmonary vasculature with mild diffuse increased interstitial markings. Mild cardiomegaly. Upper mediastinal contours are within normal limits given the low lung volumes and patient rotation to the right. Aortic atherosclerosis. Status post median sternotomy. Left-sided biventricular pacemaker in position with lead tips projecting over the expected location of the right atrium, right ventricular apex and left ventricle (via the coronary sinus and coronary veins). Status post mitral valve replacement. Post vertebroplasty changes in midthoracic vertebral bodies again noted.  IMPRESSION: 1. The appearance the chest suggests worsening congestive heart failure, as above. 2. Bibasilar opacities may reflect areas of atelectasis and/or consolidation. 3. Aortic atherosclerosis. Electronically Signed   By: Vinnie Langton M.D.   On: 12/06/2018 19:48   Dg Chest 2 View  Result Date: 11/13/2018 CLINICAL DATA:  84 year old female with shortness of breath. Traumatic bleeding recently, on Coumadin. EXAM: CHEST - 2  VIEW COMPARISON:  11/04/2018 and earlier. FINDINGS: Seated AP and lateral views of the chest. Stable cardiomegaly and mediastinal contours. Previous cardiac valve replacement. Stable left chest pacemaker. Regressed bilateral pulmonary interstitial opacity. Mild residual patchy opacity now about the left hilum and in the right upper lung. On the lateral view there are small bilateral pleural effusions with some fluid tracking into the minor fissure. Osteopenia with progressed thoracic compression fractures since 2017. Two levels previously augmented. Negative visible bowel gas pattern. IMPRESSION: 1. Regressed pulmonary edema since 11/04/2018. Small bilateral pleural effusions. 2. Patchy nonspecific perihilar opacity might reflect residual edema. Acute infection difficult to exclude. Electronically Signed   By: Genevie Ann M.D.   On: 11/13/2018 10:40   Dg Chest Port 1 View  Result Date: 12/05/2018 CLINICAL DATA:  Congestive heart failure. EXAM: PORTABLE CHEST 1 VIEW COMPARISON:  11/13/2018 FINDINGS: There is a new airspace opacity that appears to be centered within the right middle lobe. The cardiac size is enlarged. The lung volumes are low. There are additional hazy airspace opacities bilaterally with prominent interstitial lung markings. There is a multi lead left-sided pacemaker in place. The patient is status post prior mitral valve replacement. There are probable small bilateral pleural effusions. The patient appears to be status post multilevel vertebral augmentation. IMPRESSION:  1. Airspace opacity in the right middle lobe concerning for a developing infiltrate or atelectasis. 2. Cardiomegaly with volume overload and small bilateral pleural effusions. Electronically Signed   By: Constance Holster M.D.   On: 12/05/2018 03:24   Dg Foot Complete Left  Result Date: 11/24/2018 CLINICAL DATA:  Lateral left foot pain for 2-3 days with a wound and bruising. EXAM: LEFT FOOT - COMPLETE 3+ VIEW COMPARISON:  None. FINDINGS: The bones are diffusely osteopenic. There is minimal soft tissue prominence at the base of the fifth metatarsal. No acute fracture, dislocation, osseous erosion, soft tissue emphysema, radiopaque foreign body is identified. Extensive atherosclerotic vascular calcifications are noted. IMPRESSION: No acute osseous abnormality identified. Electronically Signed   By: Logan Bores M.D.   On: 11/24/2018 17:25      ASSESSMENT/PLAN    Acute on chronic hypoxemic respiratory failure -Due to acute decompensated systolic CHF with EF 25 to 30% -Cardiology on case appreciate input-follow recommendations -Currently being diuresed 80 mg IV twice daily of Lasix, will add IV albumin to increase efficacy -Complicated by CKD and significant chronic anemia- will ask nephro to evaluate patient as she is higher risk for progressing to ESRD     Bilateral atelectasis  -Contributing to hypoxemia - chest PT - MetaNeb BID - Incentive spirometer to be used each hour multiple times - please encourage to use  - patient has difficulty expectorating phlegm - will ask RT to bring Acapella device as well      Moderate protein calorie malnutrition and deconditioning -Contributing to dyspnea -Patient working with physical therapy was able to get out of bed and slowly walk today but encouraged to continue this as well as outpatient -Nutritional assessment patient has been aspirating and has been evaluated with modified barium swallow recommended aspiration precautions- will place  consultation for dietitian eval        Thank you for allowing me to participate in the care of this patient.   Patient/Family are satisfied with care plan and all questions have been answered.  This document was prepared using Dragon voice recognition software and may include unintentional dictation errors.     Ottie Glazier, M.D.  Division of Pulmonary &  [place]

## 2018-12-08 NOTE — Consult Note (Signed)
[place] for warfarin dose management Indication: atrial fibrillation  Patient Measurements: Height: 4\' 11"  (149.9 cm) Weight: 141 lb 3.2 oz (64 kg) IBW/kg (Calculated) : 43.2  Vital Signs: Temp: 98 F (36.7 C) (06/09 0744) Temp Source: Oral (06/09 0744) BP: 101/51 (06/09 0744) Pulse Rate: 69 (06/09 0744)  Labs: Recent Labs    12/06/18 0442 12/07/18 0453 12/08/18 0904  HGB 8.0* 8.8*  --   HCT 27.6* 30.1*  --   PLT 171 199  --   LABPROT 30.8* 27.1* 26.3*  INR 3.0* 2.6* 2.5*  CREATININE 1.30* 1.22* 1.14*    Estimated Creatinine Clearance: 31.5 mL/min (A) (by C-G formula based on SCr of 1.14 mg/dL (H)).   Medical History: Past Medical History:  Diagnosis Date  . Arthritis    "back, hands, knees" (04/10/2016)  . Chronic back pain   . 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 EF 25% by echo in 08/2015 b. RHC in 08/2015 showed normal filling pressures; c. 10/2018 Echo (in setting of atrial tachycardia): EF 25-30%, impaired relaxation. Glob HK. RVSP 56mmHg. Mod dil RA/LA. Mech MV w/ nl fxn (mean grad 32mmHg). Mod TR.   Marland Kitchen Chronic kidney disease   . Compression fracture    "several; all in my back" (04/10/2016)  . GERD (gastroesophageal reflux disease)   . Hypertension   . Lymphedema   . Pacemaker 05/2017  . PAF (paroxysmal atrial fibrillation) ([place])    a. On amio & Coumadin - CHA2DS2VASc = 4.  . S/P MVR (mitral valve replacement)    a. MVR 1994 b. redo MVR in 08/2014 - on Coumadin  . Shortness of breath dyspnea     Medications:  Scheduled:  . amiodarone  200 mg Oral Daily  . carvedilol  3.125 mg Oral BID WC  . cholecalciferol  2,000 Units Oral Daily  . feeding supplement (ENSURE ENLIVE)  237 mL Oral BID BM  . furosemide  80 mg Intravenous Q12H  . losartan  12.5 mg Oral Daily  . polyethylene glycol  17 g Oral Daily  . potassium chloride SA  20 mEq Oral Daily  . sodium  chloride flush  3 mL Intravenous Q12H  . vitamin C  500 mg Oral Daily  . Warfarin - Pharmacist Dosing Inpatient   Does not apply q1800  . zinc sulfate  220 mg Oral Daily    Assessment: Pt was on warfarin previously for atrial fibrillation with mechanical mitral valve. On admission patient's INR was 3.1 (up to 3.6 today). Pt is on amiodarone which can increase INR. At home patient takes 4 mg daily- she is a resident at [facility] in [place]. I called the facility to confirm the dose changed from prior admission.   Today's INR is therapeutic.    --DDI with amiodarone (home med)   6/5 INR 3.1 6/6 INR 3.6  HELD 6/7 INR 3.0 (3 mg given) 6/8 INR 2.6 (4 mg given) 6/9 INR 2.5   Goal of Therapy:  INR 2-3 (per cardiology on previous visit d/t bleed during that admission, even with valve). Update: Discussed with Dr.  Saunders Revel INR goal. Will continue to keep goal of 2-3, but will favor an estimated range of 2.5-3. Monitor platelets by anticoagulation protocol: Yes   Plan:  Will order warfarin 4 mg x 1  and repeat INR in AM.   Rowland Lathe, PharmD 12/08/2018,12:20 PM

## 2018-12-08 NOTE — Addendum Note (Signed)
Addended by: Alba Destine on: 12/08/2018 08:15 AM   Modules accepted: Orders

## 2018-12-08 NOTE — Progress Notes (Signed)
Applied wound barrier to patients buttocks, which is red but blanchable. Applied sacral foam to sacrum. Educated patient about repositioning q2hours to avoid skin breakdown. Patient refused repositioning.

## 2018-12-09 ENCOUNTER — Ambulatory Visit: Payer: Medicare Other | Admitting: Cardiovascular Disease

## 2018-12-09 LAB — CBC WITH DIFFERENTIAL/PLATELET
Abs Immature Granulocytes: 0.02 10*3/uL (ref 0.00–0.07)
Basophils Absolute: 0 10*3/uL (ref 0.0–0.1)
Basophils Relative: 1 %
Eosinophils Absolute: 0.2 10*3/uL (ref 0.0–0.5)
Eosinophils Relative: 4 %
HCT: 27 % — ABNORMAL LOW (ref 36.0–46.0)
Hemoglobin: 8 g/dL — ABNORMAL LOW (ref 12.0–15.0)
Immature Granulocytes: 0 %
Lymphocytes Relative: 10 %
Lymphs Abs: 0.5 10*3/uL — ABNORMAL LOW (ref 0.7–4.0)
MCH: 26.2 pg (ref 26.0–34.0)
MCHC: 29.6 g/dL — ABNORMAL LOW (ref 30.0–36.0)
MCV: 88.5 fL (ref 80.0–100.0)
Monocytes Absolute: 0.6 10*3/uL (ref 0.1–1.0)
Monocytes Relative: 13 %
Neutro Abs: 3.3 10*3/uL (ref 1.7–7.7)
Neutrophils Relative %: 72 %
Platelets: 159 10*3/uL (ref 150–400)
RBC: 3.05 MIL/uL — ABNORMAL LOW (ref 3.87–5.11)
RDW: 16.4 % — ABNORMAL HIGH (ref 11.5–15.5)
WBC: 4.5 10*3/uL (ref 4.0–10.5)
nRBC: 0 % (ref 0.0–0.2)

## 2018-12-09 LAB — PROTIME-INR
INR: 2.6 — ABNORMAL HIGH (ref 0.8–1.2)
Prothrombin Time: 27.4 seconds — ABNORMAL HIGH (ref 11.4–15.2)

## 2018-12-09 LAB — BASIC METABOLIC PANEL
Anion gap: 7 (ref 5–15)
BUN: 22 mg/dL (ref 8–23)
CO2: 34 mmol/L — ABNORMAL HIGH (ref 22–32)
Calcium: 8.9 mg/dL (ref 8.9–10.3)
Chloride: 100 mmol/L (ref 98–111)
Creatinine, Ser: 1.35 mg/dL — ABNORMAL HIGH (ref 0.44–1.00)
GFR calc Af Amer: 43 mL/min — ABNORMAL LOW (ref >60)
GFR calc non Af Amer: 37 mL/min — ABNORMAL LOW (ref >60)
Glucose, Bld: 93 mg/dL (ref 70–99)
Potassium: 3.7 mmol/L (ref 3.5–5.1)
Sodium: 141 mmol/L (ref 135–145)

## 2018-12-09 MED ORDER — DIPHENHYDRAMINE HCL 25 MG PO CAPS
25.0000 mg | ORAL_CAPSULE | Freq: Once | ORAL | Status: AC
  Administered 2018-12-09: 25 mg via ORAL
  Filled 2018-12-09: qty 1

## 2018-12-09 MED ORDER — DIPHENHYDRAMINE HCL 25 MG PO CAPS
25.0000 mg | ORAL_CAPSULE | Freq: Four times a day (QID) | ORAL | Status: DC | PRN
  Administered 2018-12-09: 25 mg via ORAL
  Filled 2018-12-09: qty 1

## 2018-12-09 MED ORDER — FUROSEMIDE 40 MG PO TABS
40.0000 mg | ORAL_TABLET | Freq: Two times a day (BID) | ORAL | Status: DC
  Administered 2018-12-09 – 2018-12-13 (×6): 40 mg via ORAL
  Filled 2018-12-09 (×7): qty 1

## 2018-12-09 MED ORDER — HYDROXYZINE HCL 25 MG PO TABS
25.0000 mg | ORAL_TABLET | Freq: Three times a day (TID) | ORAL | Status: DC | PRN
  Administered 2018-12-09 – 2018-12-12 (×5): 25 mg via ORAL
  Filled 2018-12-09 (×5): qty 1

## 2018-12-09 MED ORDER — WARFARIN SODIUM 4 MG PO TABS
4.0000 mg | ORAL_TABLET | Freq: Once | ORAL | Status: AC
  Administered 2018-12-09: 4 mg via ORAL
  Filled 2018-12-09: qty 1

## 2018-12-09 NOTE — Progress Notes (Signed)
Pt BP 91/50. NP Seals made aware. 80 mg IV lasix and one time dose of 40 IV lasix given. On recheck, BP 104/51. Daughter and pt educated on plan of care concerning the lasix and blood pressure. No concerns offered.

## 2018-12-09 NOTE — Progress Notes (Signed)
Patient given flutter device and instruction on use. Patient states she would like to sleep throughout the night, resume CPT in the morning.

## 2018-12-09 NOTE — Progress Notes (Signed)
Pulmonary Medicine          Date: 12/09/2018,   MRN# [id number] [name] [birth year]     AdmissionWeight: 66.5 kg                 CurrentWeight: 64.6 kg        SUBJECTIVE   Patient states she feels slightly better.  She has used MetaNeb and feels that it has helped her breathe better.  She is only able to get IS up to 375ml.  She is weak and unable to expectorate phlegm well.    PAST MEDICAL HISTORY   Past Medical History:  Diagnosis Date   Arthritis    "back, hands, knees" (04/10/2016)   Chronic back pain    Chronic combined systolic (congestive) and diastolic (congestive) heart failure (HCC)    a. EF 25% by echo in 08/2015 b. RHC in 08/2015 showed normal filling pressures; c. 10/2018 Echo (in setting of atrial tachycardia): EF 25-30%, impaired relaxation. Glob HK. RVSP 71mmHg. Mod dil RA/LA. Mech MV w/ nl fxn (mean grad 65mmHg). Mod TR.    Chronic kidney disease    Compression fracture    "several; all in my back" (04/10/2016)   GERD (gastroesophageal reflux disease)    Hypertension    Lymphedema    Pacemaker 05/2017   PAF (paroxysmal atrial fibrillation) ([place])    a. On amio & Coumadin - CHA2DS2VASc = 4.   S/P MVR (mitral valve replacement)    a. MVR 1994 b. redo MVR in 08/2014 - on Coumadin   Shortness of breath dyspnea      SURGICAL HISTORY   Past Surgical History:  Procedure Laterality Date   BREAST LUMPECTOMY Right 2005?   benign lump excision   CARDIAC CATHETERIZATION     CARDIAC VALVE REPLACEMENT  1994, 2016   "MVR; MVR"   CARDIOVERSION N/A 11/06/2018   Procedure: CARDIOVERSION;  Surgeon: Minna Merritts, MD;  Location: [facility];  Service: Cardiovascular;  Laterality: N/A;   CATARACT EXTRACTION W/PHACO Right 02/20/2015   Procedure: CATARACT EXTRACTION PHACO AND INTRAOCULAR LENS PLACEMENT ([place]);  Surgeon: Estill Cotta, MD;  Location: [facility];  Service: Ophthalmology;  Laterality: Right;  US01:31.2 AP  25.3% CDE40.13 Fluid lot # [id number]   DRESSING CHANGE UNDER ANESTHESIA Left 11/17/2018   Procedure: DRESSING CHANGE;  Surgeon: Robert Bellow, MD;  Location: [facility];  Service: General;  Laterality: Left;  no anesthesia   ELECTROPHYSIOLOGIC STUDY N/A 10/09/2015   Procedure: CARDIOVERSION;  Surgeon: Wellington Hampshire, MD;  Location: [facility];  Service: Cardiovascular;  Laterality: N/A;   INCISION AND DRAINAGE OF WOUND Left 11/13/2018   Procedure: LEFT LEG DRESSING CHANGE;  Surgeon: Robert Bellow, MD;  Location: [facility];  Service: General;  Laterality: Left;   INCISION AND DRAINAGE OF WOUND Left 11/19/2018   Procedure: LEFT LEG DRESSING CHANGE AND GRAFT APPLICATION;  Surgeon: Robert Bellow, MD;  Location: [facility];  Service: General;  Laterality: Left;   IR GENERIC HISTORICAL  04/01/2016   IR RADIOLOGIST EVAL & MGMT 04/01/2016 [facility]   IR GENERIC HISTORICAL  04/15/2016   IR VERTEBROPLASTY CERV/THOR BX INC UNI/BIL INC/INJECT/IMAGING 04/15/2016 Luanne Bras, MD [facility]   IR GENERIC HISTORICAL  04/15/2016   IR VERTEBROPLASTY EA ADDL (T&LS) BX INC UNI/BIL INC INJECT/IMAGING 04/15/2016 Luanne Bras, MD [facility]   IR GENERIC HISTORICAL  04/15/2016   IR VERTEBROPLASTY EA ADDL (T&LS) BX INC UNI/BIL INC INJECT/IMAGING 04/15/2016 Luanne Bras, MD  [facility]   IR GENERIC HISTORICAL  05/20/2016   IR RADIOLOGIST EVAL & MGMT 05/20/2016 [facility]   IR GENERIC HISTORICAL  06/13/2016   IR VERTEBROPLASTY EA ADDL (T&LS) BX INC UNI/BIL INC INJECT/IMAGING 06/13/2016 Luanne Bras, MD [facility]   IR GENERIC HISTORICAL  06/13/2016   IR SACROPLASTY BILATERAL 06/13/2016 Luanne Bras, MD [facility]   IR GENERIC HISTORICAL  06/13/2016   IR VERTEBROPLASTY LUMBAR BX INC UNI/BIL INC/INJECT/IMAGING 06/13/2016 Luanne Bras, MD [facility]   IRRIGATION AND DEBRIDEMENT HEMATOMA Left 07/05/2015   Procedure: IRRIGATION AND DEBRIDEMENT HEMATOMA;   Surgeon: Robert Bellow, MD;  Location: [facility];  Service: General;  Laterality: Left;   IRRIGATION AND DEBRIDEMENT HEMATOMA Left 11/11/2018   Procedure: IRRIGATION AND DEBRIDEMENT LEFT LEG HEMATOMA;  Surgeon: Robert Bellow, MD;  Location: [facility];  Service: General;  Laterality: Left;   pyloric stenosis  07/1937   US ECHOCARDIOGRAPHY       FAMILY HISTORY   Family History  Problem Relation Age of Onset   Stroke Mother    Renal Disease Father      SOCIAL HISTORY   Social History   Tobacco Use   Smoking status: Former Smoker    Packs/day: 1.00    Years: 27.00    Pack years: 27.00    Types: Cigarettes    Last attempt to quit: 07/01/1980    Years since quitting: 38.4   Smokeless tobacco: Never Used   Tobacco comment: "quit smoking ~ 1980  Substance Use Topics   Alcohol use: Not Currently    Alcohol/week: 0.0 standard drinks    Comment: 04/10/2016 "I'll have a drink on holidays/special occasions"   Drug use: No     MEDICATIONS    Home Medication:    Current Medication:  Current Facility-Administered Medications:    0.9 %  sodium chloride infusion, 250 mL, Intravenous, PRN, Seals, Angela H, NP   acetaminophen (TYLENOL) tablet 650 mg, 650 mg, Oral, Q4H PRN, Seals, Angela H, NP, 650 mg at 12/05/18 2127   albuterol (PROVENTIL) (2.5 MG/3ML) 0.083% nebulizer solution 2.5 mg, 2.5 mg, Nebulization, Q6H PRN, Seals, Angela H, NP, 2.5 mg at 12/08/18 1638   ALPRAZolam (XANAX) tablet 0.25 mg, 0.25 mg, Oral, QHS PRN, Seals, Angela H, NP   amiodarone (PACERONE) tablet 200 mg, 200 mg, Oral, Daily, Seals, Angela H, NP, 200 mg at 12/09/18 0933   carvedilol (COREG) tablet 3.125 mg, 3.125 mg, Oral, BID WC, Arida, Muhammad A, MD, 3.125 mg at 12/09/18 1547   cholecalciferol (VITAMIN D) tablet 2,000 Units, 2,000 Units, Oral, Daily, Seals, Angela H, NP, 2,000 Units at 12/09/18 0932   diphenhydrAMINE (BENADRYL) capsule 25 mg, 25 mg, Oral, Q6H PRN, Mayo, Pete Pelt,  MD, 25 mg at 12/09/18 1136   feeding supplement (ENSURE ENLIVE) (ENSURE ENLIVE) liquid 237 mL, 237 mL, Oral, BID BM, Mayo, Pete Pelt, MD, 237 mL at 12/07/18 1435   furosemide (LASIX) tablet 40 mg, 40 mg, Oral, BID, Mayo, Pete Pelt, MD, 40 mg at 12/09/18 1729   hydrOXYzine (ATARAX/VISTARIL) tablet 25 mg, 25 mg, Oral, TID PRN, Mayo, Pete Pelt, MD, 25 mg at 12/09/18 1729   ipratropium-albuterol (DUONEB) 0.5-2.5 (3) MG/3ML nebulizer solution 3 mL, 3 mL, Nebulization, Q6H PRN, Seals, Angela H, NP   losartan (COZAAR) tablet 12.5 mg, 12.5 mg, Oral, Daily, Arida, Muhammad A, MD, 12.5 mg at 12/09/18 0933   ondansetron (ZOFRAN) injection 4 mg, 4 mg, Intravenous, Q6H PRN, Seals, [name] Dills, NP   oxyCODONE (Oxy  IR/ROXICODONE) immediate release tablet 2.5 mg, 2.5 mg, Oral, Q6H PRN, Seals, Angela H, NP, 2.5 mg at 12/08/18 1754   polyethylene glycol (MIRALAX / GLYCOLAX) packet 17 g, 17 g, Oral, Daily, Seals, Angela H, NP, 17 g at 12/09/18 0933   potassium chloride SA (K-DUR) CR tablet 20 mEq, 20 mEq, Oral, Daily, Seals, Angela H, NP, 20 mEq at 12/09/18 0932   sodium chloride flush (NS) 0.9 % injection 3 mL, 3 mL, Intravenous, Q12H, Seals, Angela H, NP, 3 mL at 12/09/18 0933   sodium chloride flush (NS) 0.9 % injection 3 mL, 3 mL, Intravenous, PRN, Seals, Angela H, NP   vitamin C (ASCORBIC ACID) tablet 500 mg, 500 mg, Oral, Daily, Seals, Angela H, NP, 500 mg at 12/09/18 4917   Warfarin - Pharmacist Dosing Inpatient, , Does not apply, q1800, Mayo, Pete Pelt, MD   zinc sulfate capsule 220 mg, 220 mg, Oral, Daily, Seals, Angela H, NP, 220 mg at 12/09/18 0933    ALLERGIES   Ace inhibitors; Doxycycline; Hydrocodone; Mercury; Silver; Cephalexin; Clindamycin/lincomycin; Nickel; and Penicillins     REVIEW OF SYSTEMS    Review of Systems:  Gen:  Denies  fever, sweats, chills weigh loss  HEENT: Denies blurred vision, double vision, ear pain, eye pain, hearing loss, nose bleeds, sore throat Cardiac:   No dizziness, chest pain or heaviness, chest tightness,edema Resp:   Denies cough or sputum porduction, shortness of breath,wheezing, hemoptysis,  Gi: Denies swallowing difficulty, stomach pain, nausea or vomiting, diarrhea, constipation, bowel incontinence Gu:  Denies bladder incontinence, burning urine Ext:   Denies Joint pain, stiffness or swelling Skin: Denies  skin rash, easy bruising or bleeding or hives Endoc:  Denies polyuria, polydipsia , polyphagia or weight change Psych:   Denies depression, insomnia or hallucinations   Other:  All other systems negative   VS: BP 115/63    Pulse 90    Temp 98.4 F (36.9 C)    Resp 18    Ht 4\' 11"  (1.499 m)    Wt 64.6 kg    SpO2 94%    BMI 28.76 kg/m      PHYSICAL EXAM    GENERAL:NAD, no fevers, chills, no weakness no fatigue HEAD: Normocephalic, atraumatic.  EYES: Pupils equal, round, reactive to light. Extraocular muscles intact. No scleral icterus.  MOUTH: Moist mucosal membrane. Dentition intact. No abscess noted.  EAR, NOSE, THROAT: Clear without exudates. No external lesions.  NECK: Supple. No thyromegaly. No nodules. No JVD.  PULMONARY: mild bibasilar crackles CARDIOVASCULAR: S1 and S2. Regular rate and rhythm. No murmurs, rubs, or gallops. No edema. Pedal pulses 2+ bilaterally.  GASTROINTESTINAL: Soft, nontender, nondistended. No masses. Positive bowel sounds. No hepatosplenomegaly.  MUSCULOSKELETAL: No swelling, clubbing, or edema. Range of motion full in all extremities.  NEUROLOGIC: Cranial nerves II through XII are intact. No gross focal neurological deficits. Sensation intact. Reflexes intact.  SKIN: No ulceration, lesions, rashes, or cyanosis. Skin warm and dry. Turgor intact.  PSYCHIATRIC: Mood, affect within normal limits. The patient is awake, alert and oriented x 3. Insight, judgment intact.       IMAGING    Dg Chest 1 View  Result Date: 12/08/2018 CLINICAL DATA:  Shortness of breath EXAM: CHEST  1 VIEW  COMPARISON:  12/07/2018 FINDINGS: Prior median sternotomy and valve replacement. Left pacer remains in place, unchanged. Cardiomegaly with vascular congestion. Diffuse interstitial and airspace opacities lung with small effusions again noted. No real change. IMPRESSION: No significant change since prior study. Electronically Signed  By: Rolm Baptise M.D.   On: 12/08/2018 08:50   Dg Chest 1 View  Result Date: 12/07/2018 CLINICAL DATA:  Shortness of breath EXAM: CHEST  1 VIEW COMPARISON:  12/06/2018 FINDINGS: Cardiac shadow is enlarged but stable. Pacing device and postsurgical changes are again seen. Small effusions are again seen and stable. Patchy infiltrates are again noted right greater than left but stable from the prior exam. No new focal abnormality is noted. IMPRESSION: Stable airspace opacities right greater than left with associated small effusions. Electronically Signed   By: Inez Catalina M.D.   On: 12/07/2018 07:30   Dg Chest 1 View  Result Date: 12/06/2018 CLINICAL DATA:  Shortness of breath.  Lower extremity edema. EXAM: CHEST  1 VIEW COMPARISON:  Chest radiograph 12/05/2018 FINDINGS: Multi lead pacer apparatus overlies the left hemithorax. Leads are stable in position. Stable cardiomegaly. Similar-appearing patchy areas of consolidation within the mid and lower lungs bilaterally. Bilateral small to moderate pleural effusions. No pneumothorax. IMPRESSION: Cardiomegaly. Moderate bilateral pleural effusions with underlying consolidation favored to represent combination of atelectasis and edema. Superimposed infection not excluded. Electronically Signed   By: Lovey Newcomer M.D.   On: 12/06/2018 06:44   Dg Chest 2 View  Result Date: 12/06/2018 CLINICAL DATA:  84 year old female with history of hepatocellular carcinoma. Hypertension and shortness of breath. EXAM: CHEST - 2 VIEW COMPARISON:  Chest x-ray 11/13/2018. FINDINGS: Lung volumes are low. Moderate bilateral pleural effusions. Bibasilar areas  of atelectasis and/or consolidation. Cephalization of the pulmonary vasculature with mild diffuse increased interstitial markings. Mild cardiomegaly. Upper mediastinal contours are within normal limits given the low lung volumes and patient rotation to the right. Aortic atherosclerosis. Status post median sternotomy. Left-sided biventricular pacemaker in position with lead tips projecting over the expected location of the right atrium, right ventricular apex and left ventricle (via the coronary sinus and coronary veins). Status post mitral valve replacement. Post vertebroplasty changes in midthoracic vertebral bodies again noted. IMPRESSION: 1. The appearance the chest suggests worsening congestive heart failure, as above. 2. Bibasilar opacities may reflect areas of atelectasis and/or consolidation. 3. Aortic atherosclerosis. Electronically Signed   By: Vinnie Langton M.D.   On: 12/06/2018 19:48   Dg Chest 2 View  Result Date: 11/13/2018 CLINICAL DATA:  84 year old female with shortness of breath. Traumatic bleeding recently, on Coumadin. EXAM: CHEST - 2 VIEW COMPARISON:  11/04/2018 and earlier. FINDINGS: Seated AP and lateral views of the chest. Stable cardiomegaly and mediastinal contours. Previous cardiac valve replacement. Stable left chest pacemaker. Regressed bilateral pulmonary interstitial opacity. Mild residual patchy opacity now about the left hilum and in the right upper lung. On the lateral view there are small bilateral pleural effusions with some fluid tracking into the minor fissure. Osteopenia with progressed thoracic compression fractures since 2017. Two levels previously augmented. Negative visible bowel gas pattern. IMPRESSION: 1. Regressed pulmonary edema since 11/04/2018. Small bilateral pleural effusions. 2. Patchy nonspecific perihilar opacity might reflect residual edema. Acute infection difficult to exclude. Electronically Signed   By: Genevie Ann M.D.   On: 11/13/2018 10:40   Dg Chest  Port 1 View  Result Date: 12/05/2018 CLINICAL DATA:  Congestive heart failure. EXAM: PORTABLE CHEST 1 VIEW COMPARISON:  11/13/2018 FINDINGS: There is a new airspace opacity that appears to be centered within the right middle lobe. The cardiac size is enlarged. The lung volumes are low. There are additional hazy airspace opacities bilaterally with prominent interstitial lung markings. There is a multi lead left-sided pacemaker in place.  The patient is status post prior mitral valve replacement. There are probable small bilateral pleural effusions. The patient appears to be status post multilevel vertebral augmentation. IMPRESSION: 1. Airspace opacity in the right middle lobe concerning for a developing infiltrate or atelectasis. 2. Cardiomegaly with volume overload and small bilateral pleural effusions. Electronically Signed   By: Constance Holster M.D.   On: 12/05/2018 03:24   Dg Foot Complete Left  Result Date: 11/24/2018 CLINICAL DATA:  Lateral left foot pain for 2-3 days with a wound and bruising. EXAM: LEFT FOOT - COMPLETE 3+ VIEW COMPARISON:  None. FINDINGS: The bones are diffusely osteopenic. There is minimal soft tissue prominence at the base of the fifth metatarsal. No acute fracture, dislocation, osseous erosion, soft tissue emphysema, radiopaque foreign body is identified. Extensive atherosclerotic vascular calcifications are noted. IMPRESSION: No acute osseous abnormality identified. Electronically Signed   By: Logan Bores M.D.   On: 11/24/2018 17:25      ASSESSMENT/PLAN     Acute on chronic hypoxemic respiratory failure -Due to acute decompensated systolic CHF with EF 25 to 30% -Cardiology on case appreciate input-follow recommendations -Currently being diuresed 40 mg IV twice daily of Lasix, will add IV albumin to increase efficacy -Complicated by CKD and significant chronic anemia- will ask nephro to evaluate patient as she is higher risk for progressing to  ESRD     Bilateral atelectasis  -Contributing to hypoxemia - chest PT - MetaNeb BID - Incentive spirometer to be used each hour multiple times - please encourage to use  - patient has difficulty expectorating phlegm -continue Acapella      Moderate protein calorie malnutrition and deconditioning -Contributing to dyspnea -Patient working with physical therapy was able to get out of bed and slowly walk today but encouraged to continue this as well as outpatient -Nutritional assessment patient has been aspirating and has been evaluated with modified barium swallow recommended aspiration precautions- will place consultation for dietitian eval -has ensure at bedside       Thank you for allowing me to participate in the care of this patient.   Patient/Family are satisfied with care plan and all questions have been answered.  This document was prepared using Dragon voice recognition software and may include unintentional dictation errors.     Ottie Glazier, M.D.  Division of [facility]

## 2018-12-09 NOTE — Progress Notes (Signed)
Progress Note  Patient Name: [name] Date of Encounter: 12/09/2018  Primary Cardiologist: Ida Rogue, MD   Subjective   Patient reports breathing improved from yesterday and feels good sitting in the chair this AM.  She denies chest pain, palpitations, and racing heart rate.  She still feels as if she has some bilateral lower extremity edema with both legs wrapped during today's exam.    Inpatient Medications    Scheduled Meds: . amiodarone  200 mg Oral Daily  . carvedilol  3.125 mg Oral BID WC  . cholecalciferol  2,000 Units Oral Daily  . feeding supplement (ENSURE ENLIVE)  237 mL Oral BID BM  . furosemide  40 mg Oral BID  . losartan  12.5 mg Oral Daily  . polyethylene glycol  17 g Oral Daily  . potassium chloride SA  20 mEq Oral Daily  . sodium chloride flush  3 mL Intravenous Q12H  . vitamin C  500 mg Oral Daily  . warfarin  4 mg Oral ONCE-1800  . Warfarin - Pharmacist Dosing Inpatient   Does not apply q1800  . zinc sulfate  220 mg Oral Daily   Continuous Infusions: . sodium chloride     PRN Meds: sodium chloride, acetaminophen, albuterol, ALPRAZolam, diphenhydrAMINE, ipratropium-albuterol, ondansetron (ZOFRAN) IV, oxyCODONE, sodium chloride flush   Vital Signs    Vitals:   12/08/18 2245 12/09/18 0046 12/09/18 0306 12/09/18 0833  BP: (!) 104/51 (!) 103/48 (!) 123/58 112/72  Pulse: 60 65 61 67  Resp:   20 19  Temp:   97.6 F (36.4 C) 98 F (36.7 C)  TempSrc:   Oral Oral  SpO2:   92% 93%  Weight:   64.6 kg   Height:        Intake/Output Summary (Last 24 hours) at 12/09/2018 1345 Last data filed at 12/09/2018 1011 Gross per 24 hour  Intake 480 ml  Output 1050 ml  Net -570 ml   Filed Weights   12/07/18 0250 12/08/18 0308 12/09/18 0306  Weight: 64.8 kg 64 kg 64.6 kg    Telemetry    AV paced, 60s - Personally Reviewed  ECG    No new tracings- Personally Reviewed  Physical Exam   GEN: No acute distress.  Sitting in recliner by the  bed. Neck: JVP ~8-10cm Cardiac: RRR, 2/6 systolic murmur. No rubs, or gallops.  Respiratory: Coarse bilateral breath sounds, bibasilar crackles. GI: Soft, nontender, non-distended  MS: B/l calves wrapped with ACE bandages. Neuro:  Nonfocal  Psych: Normal affect   Labs    Chemistry Recent Labs  Lab 12/04/18 1627 12/04/18 1954  12/07/18 0453 12/08/18 0904 12/09/18 0422  NA 141 139   < > 139 140 141  K 4.8 4.0   < > 4.0 3.5 3.7  CL 100 99   < > 99 99 100  CO2 30 30   < > 34* 31 34*  GLUCOSE 94 98   < > 101* 113* 93  BUN 25* 24*   < > 21 21 22   CREATININE 1.20* 1.23*   < > 1.22* 1.14* 1.35*  CALCIUM 9.2 8.9   < > 8.8* 9.0 8.9  PROT 6.6 6.2*  --   --   --   --   ALBUMIN 3.7 3.5  --   --   --   --   AST 21 20  --   --   --   --   ALT 15 15  --   --   --   --  ALKPHOS 92 89  --   --   --   --   BILITOT 1.0 0.9  --   --   --   --   GFRNONAA 42* 41*   < > 42* 45* 37*  GFRAA 49* 48*   < > 48* 52* 43*  ANIONGAP 11 10   < > 6 10 7    < > = values in this interval not displayed.     Hematology Recent Labs  Lab 12/06/18 0442 12/07/18 0453 12/09/18 0422  WBC 4.1 6.3 4.5  RBC 3.10* 3.40* 3.05*  HGB 8.0* 8.8* 8.0*  HCT 27.6* 30.1* 27.0*  MCV 89.0 88.5 88.5  MCH 25.8* 25.9* 26.2  MCHC 29.0* 29.2* 29.6*  RDW 16.4* 16.4* 16.4*  PLT 171 199 159    Cardiac Enzymes Recent Labs  Lab 12/04/18 1954 12/05/18 0258 12/05/18 0902  TROPONINI 0.05* 0.04* 0.04*   No results for input(s): TROPIPOC in the last 168 hours.   BNP Recent Labs  Lab 12/04/18 1627 12/04/18 1954  BNP 1,189.0* 992.0*     DDimer No results for input(s): DDIMER in the last 168 hours.   Radiology    Dg Chest 1 View  Result Date: 12/08/2018 CLINICAL DATA:  Shortness of breath EXAM: CHEST  1 VIEW COMPARISON:  12/07/2018 FINDINGS: Prior median sternotomy and valve replacement. Left pacer remains in place, unchanged. Cardiomegaly with vascular congestion. Diffuse interstitial and airspace opacities lung  with small effusions again noted. No real change. IMPRESSION: No significant change since prior study. Electronically Signed   By: Rolm Baptise M.D.   On: 12/08/2018 08:50    Cardiac Studies   TTE 11/05/2018  1. The left ventricle has severely reduced systolic function, with an ejection fraction of 25-30%. The cavity size was mildly dilated. Left ventricular diastolic Doppler parameters are consistent with impaired relaxation. Left ventrical global  hypokinesis without regional wall motion abnormalities.  2. The right ventricle has mildly reduced systolic function. The cavity was moderately enlarged. There is no increase in right ventricular wall thickness. Right ventricular systolic pressure is moderately elevated with an estimated pressure of 53 mmHg.  3. Right atrial size was moderately dilated.  4. Left atrial size was moderately dilated.  5. The aortic valve was not well visualized. Moderate calcification of the aortic valve. Aortic valve regurgitation was not assessed by color flow Doppler. Unable to estimate adegree of aortic valve stenosis given severely reduced systolic function.  6. Mechanical mitral valve. Mean gradient 5 mm Hg.  7. Tricuspid valve regurgitation is moderate.  8. Left pleural effusion noted, 6.5 cm  9. Rhythm is atrial tachycardia, rate 105 bpm   Patient Profile     84 y.o. female with a history of persistent atrial fibrillation s/p cardioversion (most recently 03/03/8100), chronic systolic congestive heart failure s/p CRT, mechanical mitral valve replacement on chronic Coumadin, mild aortic stenosis, stage IV kidney disease, COPD on home oxygen with recent prolonged hospitalization for spontaneous left calf hematoma and acute blood loss anemia, and who was admitted from clinic due to acute on chronic systolic heart failure with volume overload and hypoxia.  Assessment & Plan    Acute on chronic systolic heart failure - Patient reported improved breathing from  yesterday.  Exam still significant for coarse lung sounds and bibasilar crackles.   -Daily BMET. Slight bump overnight Cr 1.14  1.35, BUN 21  22; K 3.7. -Stop IV lasix.  --Start oral Lasix 40 mg twice daily.  --Continue daily weights,  I/Os. -Continue low-dose carvedilol and losartan. BP soft overnight but remains stable this AM with SBP over the last 24h 91-123 and DBP 48-72. --Continue to monitor and plan for outpatient follow-up after discharge.  Acute on chronic respiratory failure with hypoxia -As above, continued improvement in shortness of breath -Start oral Lasix 40 mg twice daily.  -Wean oxygen as tolerated.  Per IM documentation, pulmonology has been consulted. Currently on  oxygen with SpO2 93%.  History of mitral valve replacement -INR 2.5  2.6. -Continue warfarin per pharmacy.   For questions or updates, please contact [facility] Please consult [email or web address] for contact info under        Signed, Arvil Chaco, PA-C  12/09/2018, 1:45 PM

## 2018-12-09 NOTE — Consult Note (Signed)
[place] for warfarin dose management Indication: atrial fibrillation  Patient Measurements: Height: 4\' 11"  (149.9 cm) Weight: 142 lb 6.4 oz (64.6 kg) IBW/kg (Calculated) : 43.2  Vital Signs: Temp: 97.6 F (36.4 C) (06/10 0306) Temp Source: Oral (06/10 0306) BP: 123/58 (06/10 0306) Pulse Rate: 61 (06/10 0306)  Labs: Recent Labs    12/07/18 0453 12/08/18 0904 12/09/18 0422  HGB 8.8*  --  8.0*  HCT 30.1*  --  27.0*  PLT 199  --  159  LABPROT 27.1* 26.3* 27.4*  INR 2.6* 2.5* 2.6*  CREATININE 1.22* 1.14* 1.35*    Estimated Creatinine Clearance: 26.7 mL/min (A) (by C-G formula based on SCr of 1.35 mg/dL (H)).   Medical History: Past Medical History:  Diagnosis Date  . Arthritis    "back, hands, knees" (04/10/2016)  . Chronic back pain   . 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 EF 25% by echo in 08/2015 b. RHC in 08/2015 showed normal filling pressures; c. 10/2018 Echo (in setting of atrial tachycardia): EF 25-30%, impaired relaxation. Glob HK. RVSP 72mmHg. Mod dil RA/LA. Mech MV w/ nl fxn (mean grad 76mmHg). Mod TR.   Marland Kitchen Chronic kidney disease   . Compression fracture    "several; all in my back" (04/10/2016)  . GERD (gastroesophageal reflux disease)   . Hypertension   . Lymphedema   . Pacemaker 05/2017  . PAF (paroxysmal atrial fibrillation) ([place])    a. On amio & Coumadin - CHA2DS2VASc = 4.  . S/P MVR (mitral valve replacement)    a. MVR 1994 b. redo MVR in 08/2014 - on Coumadin  . Shortness of breath dyspnea     Medications:  Scheduled:  . amiodarone  200 mg Oral Daily  . carvedilol  3.125 mg Oral BID WC  . cholecalciferol  2,000 Units Oral Daily  . feeding supplement (ENSURE ENLIVE)  237 mL Oral BID BM  . furosemide  80 mg Intravenous Q12H  . losartan  12.5 mg Oral Daily  . polyethylene glycol  17 g Oral Daily  . potassium chloride SA  20 mEq Oral Daily  . sodium  chloride flush  3 mL Intravenous Q12H  . vitamin C  500 mg Oral Daily  . Warfarin - Pharmacist Dosing Inpatient   Does not apply q1800  . zinc sulfate  220 mg Oral Daily    Assessment: Pt was on warfarin previously for atrial fibrillation with mechanical mitral valve. On admission patient's INR was 3.1 (up to 3.6 today). Pt is on amiodarone which can increase INR.   Home warfarin dose: At home patient takes 4 mg daily- she is a resident at [facility] in [place]. I called the facility to confirm the dose changed from prior admission.   Today's INR is therapeutic.    --DDI with amiodarone (home med)   6/5 INR 3.1 6/6 INR 3.6  HELD 6/7 INR 3.0 (3 mg given) 6/8 INR 2.6 (4 mg given) 6/9 INR 2.5 (4 mg given) 6/10 INR 2.6  Goal of Therapy:  INR 2-3 (per cardiology on previous visit d/t bleed during that admission, even with valve). Update: Discussed with Dr.  Saunders Revel INR goal. Will continue to keep goal of 2-3, but will favor an estimated range of 2.5-3. Monitor platelets by anticoagulation protocol: Yes   Plan:  Will order warfarin 4 mg x 1  and repeat INR in AM.   Rowland Lathe, PharmD 12/09/2018,8:21  AM

## 2018-12-09 NOTE — Progress Notes (Signed)
Pt c/o itching in the location of her wound which is undera dressing. NP Seals made aware, 25 mg PO benadryl ordered.

## 2018-12-09 NOTE — Progress Notes (Signed)
Nutrition Follow Up Note  RD working remotely.  DOCUMENTATION CODES:   Not applicable  INTERVENTION:   Provide Ensure Enlive po BID, each supplement provides 350 kcal and 20 grams of protein.  Continue vitamin C 500 mg daily and zinc sulfate 220 mg daily. Recommend only providing for 10 days and then assessing if still needed. Long-term zinc supplementation in absence of copper supplementation can lead to copper deficiency.  NUTRITION DIAGNOSIS:   Increased nutrient needs related to wound healing as evidenced by increased estimated needs.  GOAL:   Patient will meet greater than or equal to 90% of their needs  -progressing   MONITOR:   PO intake, Supplement acceptance, Labs, Weight trends, Skin, I & O's  ASSESSMENT:   84 year old female with PMHx of mitral valve replacement with mechanical valve on chronic Coumadin, atrial fibrillation/flutter s/p pacemaker, CKD IV, HTN, chronic systolic heart failure, lymphedema, GERD, HTN, left lower extremity wound s/p debridement admitted with acute on chronic systolic and diastolic heart failure.   Pt with improved appetite and oral intake; pt eating 100% of meals and drinking some Ensure. Per chart, pt with 4lb wt loss since admit r/t fluid changes. Pt to discharge back to SNF when medically appropriate.   Medications reviewed and include: vitamin D, lasix, miralax, KCl, vitamin C, warfarin, zinc   Labs reviewed: BUN 25(H), creat 1.19(H) Mg 2.2 wnl- 6/5 Hgb 8.0(L), Hct 27.0(L)  Diet Order:   Diet Order            Diet 2 gram sodium Room service appropriate? Yes; Fluid consistency: Thin  Diet effective now             EDUCATION NEEDS:   Not appropriate for education at this time  Skin:  Skin Assessment: Skin Integrity Issues:(closed incision left leg)  Last BM:  12/06/2018 - small type 5  Height:   Ht Readings from Last 1 Encounters:  12/04/18 4\' 11"  (1.499 m)   Weight:   Wt Readings from Last 1 Encounters:  12/09/18  64.6 kg   Ideal Body Weight:  44.7 kg  BMI:  Body mass index is 28.76 kg/m.  Estimated Nutritional Needs:   Kcal:  1500-1700  Protein:  75-85 grams  Fluid:  per MD  Koleen Distance MS, RD, LDN Pager #- [phone] Office#- [phone] After Hours Pager: [phone]

## 2018-12-09 NOTE — Progress Notes (Signed)
Dressing changed to left leg per order/ pt tolerated well

## 2018-12-09 NOTE — Progress Notes (Signed)
[place] [facility] [place] NAME: [name]    MR#:  [id number]  DATE OF BIRTH:  1935/03/09  SUBJECTIVE:   Patient continues to improve. She feels like her breathing is at baseline. No chest pain.  REVIEW OF SYSTEMS:  Review of Systems  Constitutional: Positive for malaise/fatigue. Negative for chills, fever and weight loss.  HENT: Negative for congestion, hearing loss and sore throat.   Eyes: Negative for blurred vision and double vision.  Respiratory: Negative for cough, shortness of breath and wheezing.   Cardiovascular: Positive for leg swelling. Negative for chest pain, palpitations and orthopnea.  Gastrointestinal: Negative for abdominal pain, diarrhea, nausea and vomiting.  Genitourinary: Negative for dysuria and urgency.  Musculoskeletal: Negative for myalgias.  Skin: Negative for rash.  Neurological: Negative for dizziness, sensory change, speech change, focal weakness and headaches.  Psychiatric/Behavioral: Negative for depression.   DRUG ALLERGIES:   Allergies  Allergen Reactions   Ace Inhibitors     Cough   Doxycycline Nausea Only   Hydrocodone Itching and Nausea Only   Mercury     Other reaction(s): Unknown   Silver Dermatitis    Severe itching   Cephalexin Rash   Clindamycin/Lincomycin Rash   Nickel Rash   Penicillins Rash    Has patient had a PCN reaction causing immediate rash, facial/tongue/throat swelling, SOB or lightheadedness with hypotension: YES Has patient had a PCN reaction causing severe rash involving mucus membranes or skin necrosis: NO Has patient had a PCN reaction that required hospitalization NO Has patient had a PCN reaction occurring within the last 10 years: NO If all of the above answers are "NO", then may proceed with Cephalosporin use.   VITALS:  Blood pressure 112/72, pulse 67, temperature 98 F (36.7 C), temperature source Oral, resp. rate 19, height 4\' 11"  (1.499 m), weight  64.6 kg, SpO2 93 %. PHYSICAL EXAMINATION:   GENERAL:  84 y.o.-year-old patient lying in the bed with no acute distress.  EYES: Pupils equal, round, reactive to light and accommodation. No scleral icterus. Extraocular muscles intact.  HEENT: Head atraumatic, normocephalic. Oropharynx and nasopharynx clear.  NECK:  Supple, no jugular venous distention. No thyroid enlargement, no tenderness.  LUNGS: + Diminished breath sounds in the lung bases bilaterally. No use of accessory muscles of respiration.  CARDIOVASCULAR: RRR, S1, S2 normal. Click, loud systolic murmur, no rubs, or gallops.  ABDOMEN: Soft, nontender, nondistended. Bowel sounds present. No organomegaly or mass.  MUSCULOSKELETAL: Bilateral pitting edema of lower extremities.  Ace wrap to bilateral legs, no cyanosis, or clubbing.  NEUROLOGIC:Alert and oriented x 3. CN 2-12 intact. Sensation to light touch decreased in lower extremities Gait not tested due to safety concern. PSYCHIATRIC: The patient is alert and oriented x 3.  SKIN: dry ACE bandage in place over left leg.  DATA REVIEWED:  LABORATORY PANEL:  Female CBC Recent Labs  Lab 12/09/18 0422  WBC 4.5  HGB 8.0*  HCT 27.0*  PLT 159   ------------------------------------------------------------------------------------------------------------------ Chemistries  Recent Labs  Lab 12/04/18 1954  12/09/18 0422  NA 139   < > 141  K 4.0   < > 3.7  CL 99   < > 100  CO2 30   < > 34*  GLUCOSE 98   < > 93  BUN 24*   < > 22  CREATININE 1.23*   < > 1.35*  CALCIUM 8.9   < > 8.9  MG 2.2  --   --  AST 20  --   --   ALT 15  --   --   ALKPHOS 89  --   --   BILITOT 0.9  --   --    < > = values in this interval not displayed.   RADIOLOGY:  Dg Chest 1 View  Result Date: 12/08/2018 CLINICAL DATA:  Shortness of breath EXAM: CHEST  1 VIEW COMPARISON:  12/07/2018 FINDINGS: Prior median sternotomy and valve replacement. Left pacer remains in place, unchanged. Cardiomegaly with  vascular congestion. Diffuse interstitial and airspace opacities lung with small effusions again noted. No real change. IMPRESSION: No significant change since prior study. Electronically Signed   By: Rolm Baptise M.D.   On: 12/08/2018 08:50   ASSESSMENT AND PLAN:   Acute on chronic combined systolic and diastolic CHF- improving. -Continue low dose coreg and losartan -Switch to home lasix 40mg  po bid -Daily weights, strict I/O -Cardiology following  Paroxysmal atrial fibrillation + Mechanical MVR- AV paced. INR at goal. -Continue coumadin and amiodarone  Chronic respiratory failure secondary to COPD-  no signs of exacerbation. Currently on home 3L O2. -Duonebs prn -Pulmonology consulted  Left lower extremity wound status post debridement of large hematoma -Dr. Bary Castilla following  Elevated troponin- likely demand ischemia. Troponins flat.   Hypertension- she has been normotensive -Continue low dose losartan and coreg   CKD III - creatinine better than baseline -Continue to monitor and avoid nephrotoxins  DVT prophylaxis- coumadin  Plan for discharge back to SNF, hopefully tomorrow.  All the records are reviewed and case discussed with Care Management/Social Worker. Management plans discussed with the patient, family and they are in agreement.  CODE STATUS: Full Code  TOTAL TIME TAKING CARE OF THIS PATIENT: 40 minutes.   More than 50% of the time was spent in counseling/coordination of care: YES  POSSIBLE D/C IN 1-2 DAYS, DEPENDING ON CLINICAL CONDITION.   on 12/09/2018 at 1:07 PM  Between 7am to 6pm - Pager - [phone]  After 6pm go to [email or web address] - Proofreader  [facility]  Office  [phone]  CC: Primary care physician; Rusty Aus, MD  Note: This dictation was prepared with Dragon dictation along with smaller phrase technology. Any transcriptional errors that result from this process are unintentional.

## 2018-12-09 NOTE — Progress Notes (Signed)
Patient requests to sleep during night, resume CPT at 0800.

## 2018-12-10 ENCOUNTER — Ambulatory Visit: Payer: Medicare Other | Admitting: General Surgery

## 2018-12-10 ENCOUNTER — Ambulatory Visit: Payer: Medicare Other | Admitting: Cardiovascular Disease

## 2018-12-10 LAB — CBC
HCT: 28 % — ABNORMAL LOW (ref 36.0–46.0)
Hemoglobin: 8.3 g/dL — ABNORMAL LOW (ref 12.0–15.0)
MCH: 25.9 pg — ABNORMAL LOW (ref 26.0–34.0)
MCHC: 29.6 g/dL — ABNORMAL LOW (ref 30.0–36.0)
MCV: 87.2 fL (ref 80.0–100.0)
Platelets: 176 10*3/uL (ref 150–400)
RBC: 3.21 MIL/uL — ABNORMAL LOW (ref 3.87–5.11)
RDW: 16.4 % — ABNORMAL HIGH (ref 11.5–15.5)
WBC: 4.3 10*3/uL (ref 4.0–10.5)
nRBC: 0 % (ref 0.0–0.2)

## 2018-12-10 LAB — BASIC METABOLIC PANEL
Anion gap: 8 (ref 5–15)
BUN: 24 mg/dL — ABNORMAL HIGH (ref 8–23)
CO2: 33 mmol/L — ABNORMAL HIGH (ref 22–32)
Calcium: 8.9 mg/dL (ref 8.9–10.3)
Chloride: 101 mmol/L (ref 98–111)
Creatinine, Ser: 1.43 mg/dL — ABNORMAL HIGH (ref 0.44–1.00)
GFR calc Af Amer: 40 mL/min — ABNORMAL LOW (ref >60)
GFR calc non Af Amer: 34 mL/min — ABNORMAL LOW (ref >60)
Glucose, Bld: 92 mg/dL (ref 70–99)
Potassium: 3.6 mmol/L (ref 3.5–5.1)
Sodium: 142 mmol/L (ref 135–145)

## 2018-12-10 LAB — BLOOD GAS, ARTERIAL
Acid-Base Excess: 9.2 mmol/L — ABNORMAL HIGH (ref 0.0–2.0)
Bicarbonate: 34.8 mmol/L — ABNORMAL HIGH (ref 20.0–28.0)
FIO2: 0.3
O2 Saturation: 95.2 %
Patient temperature: 37
pCO2 arterial: 50 mmHg — ABNORMAL HIGH (ref 32.0–48.0)
pH, Arterial: 7.45 (ref 7.350–7.450)
pO2, Arterial: 73 mmHg — ABNORMAL LOW (ref 83.0–108.0)

## 2018-12-10 LAB — PROTIME-INR
INR: 2.5 — ABNORMAL HIGH (ref 0.8–1.2)
Prothrombin Time: 26.7 seconds — ABNORMAL HIGH (ref 11.4–15.2)

## 2018-12-10 LAB — SARS CORONAVIRUS 2 BY RT PCR (HOSPITAL ORDER, PERFORMED IN ~~LOC~~ HOSPITAL LAB): SARS Coronavirus 2: NEGATIVE

## 2018-12-10 MED ORDER — WARFARIN SODIUM 4 MG PO TABS
4.0000 mg | ORAL_TABLET | Freq: Once | ORAL | Status: AC
  Administered 2018-12-10: 4 mg via ORAL
  Filled 2018-12-10: qty 1

## 2018-12-10 NOTE — Progress Notes (Signed)
Dressing change performed for left lower extremity. According to [place] orders there are to be 2 ABD pads applied with the lubricant on them on top of the mepitel however there is only one ABD in the entire hospital according to supply. Ample lubrication applied to site in order to prevent adherence and promote healing process. Patient tolerated well.

## 2018-12-10 NOTE — Progress Notes (Signed)
Progress Note  Patient Name: [name] Date of Encounter: 12/10/2018  Primary Cardiologist: Ida Rogue, MD   Subjective   Patient feels as if her breathing and LEE continues to improve. She noted earlier DOE when "fighting with some pillows to elevate her legs." She has been able to successfully ambulate with her walker from the bed to her recliner chair both yesterday and today however without issue.  She denies chest pain, palpitations, and racing heart rate. She did not sleep well last night, but only because she woke up at 4AM and thought it was later so could not get back to sleep.  Inpatient Medications    Scheduled Meds:  amiodarone  200 mg Oral Daily   carvedilol  3.125 mg Oral BID WC   cholecalciferol  2,000 Units Oral Daily   feeding supplement (ENSURE ENLIVE)  237 mL Oral BID BM   furosemide  40 mg Oral BID   losartan  12.5 mg Oral Daily   polyethylene glycol  17 g Oral Daily   potassium chloride SA  20 mEq Oral Daily   sodium chloride flush  3 mL Intravenous Q12H   vitamin C  500 mg Oral Daily   warfarin  4 mg Oral ONCE-1800   Warfarin - Pharmacist Dosing Inpatient   Does not apply q1800   zinc sulfate  220 mg Oral Daily   Continuous Infusions:  sodium chloride     PRN Meds: sodium chloride, acetaminophen, albuterol, ALPRAZolam, diphenhydrAMINE, hydrOXYzine, ipratropium-albuterol, ondansetron (ZOFRAN) IV, oxyCODONE, sodium chloride flush   Vital Signs    Vitals:   12/09/18 2034 12/10/18 0255 12/10/18 0429 12/10/18 0810  BP: (!) 95/52  111/67 110/66  Pulse: 87  87 87  Resp: 18  16 19   Temp: 98 F (36.7 C)  98.2 F (36.8 C) 97.7 F (36.5 C)  TempSrc: Oral  Oral Oral  SpO2: 94%  93% 100%  Weight:  64.7 kg    Height:        Intake/Output Summary (Last 24 hours) at 12/10/2018 0821 Last data filed at 12/10/2018 0558 Gross per 24 hour  Intake 480 ml  Output 350 ml  Net 130 ml   Filed Weights   12/08/18 0308 12/09/18 0306  12/10/18 0255  Weight: 64 kg 64.6 kg 64.7 kg    Telemetry    AV paced, 60 to low 90s - Personally Reviewed  ECG    No new tracings- Personally Reviewed  Physical Exam   GEN: No acute distress.  Sitting in recliner by the bed. Neck: JVP ~8-10cm Cardiac: RRR, 2/6 systolic murmur. No rubs, or gallops.  Respiratory: coarse bilateral breath sounds L>R, reduced bibasilar breath sounds. GI: Soft, nontender, non-distended  MS: B/l calves wrapped with ACE bandages and without LEE. Neuro:  Nonfocal  Psych: Normal affect, pleasant   Labs    Chemistry Recent Labs  Lab 12/04/18 1627 12/04/18 1954  12/08/18 0904 12/09/18 0422 12/10/18 0449  NA 141 139   < > 140 141 142  K 4.8 4.0   < > 3.5 3.7 3.6  CL 100 99   < > 99 100 101  CO2 30 30   < > 31 34* 33*  GLUCOSE 94 98   < > 113* 93 92  BUN 25* 24*   < > 21 22 24*  CREATININE 1.20* 1.23*   < > 1.14* 1.35* 1.43*  CALCIUM 9.2 8.9   < > 9.0 8.9 8.9  PROT 6.6 6.2*  --   --   --   --  ALBUMIN 3.7 3.5  --   --   --   --   AST 21 20  --   --   --   --   ALT 15 15  --   --   --   --   ALKPHOS 92 89  --   --   --   --   BILITOT 1.0 0.9  --   --   --   --   GFRNONAA 42* 41*   < > 45* 37* 34*  GFRAA 49* 48*   < > 52* 43* 40*  ANIONGAP 11 10   < > 10 7 8    < > = values in this interval not displayed.     Hematology Recent Labs  Lab 12/07/18 0453 12/09/18 0422 12/10/18 0449  WBC 6.3 4.5 4.3  RBC 3.40* 3.05* 3.21*  HGB 8.8* 8.0* 8.3*  HCT 30.1* 27.0* 28.0*  MCV 88.5 88.5 87.2  MCH 25.9* 26.2 25.9*  MCHC 29.2* 29.6* 29.6*  RDW 16.4* 16.4* 16.4*  PLT 199 159 176    Cardiac Enzymes Recent Labs  Lab 12/04/18 1954 12/05/18 0258 12/05/18 0902  TROPONINI 0.05* 0.04* 0.04*   No results for input(s): TROPIPOC in the last 168 hours.   BNP Recent Labs  Lab 12/04/18 1627 12/04/18 1954  BNP 1,189.0* 992.0*     DDimer No results for input(s): DDIMER in the last 168 hours.   Radiology    Dg Chest 1 View  Result Date:  12/08/2018 CLINICAL DATA:  Shortness of breath EXAM: CHEST  1 VIEW COMPARISON:  12/07/2018 FINDINGS: Prior median sternotomy and valve replacement. Left pacer remains in place, unchanged. Cardiomegaly with vascular congestion. Diffuse interstitial and airspace opacities lung with small effusions again noted. No real change. IMPRESSION: No significant change since prior study. Electronically Signed   By: Rolm Baptise M.D.   On: 12/08/2018 08:50    Cardiac Studies   TTE 11/05/2018  1. The left ventricle has severely reduced systolic function, with an ejection fraction of 25-30%. The cavity size was mildly dilated. Left ventricular diastolic Doppler parameters are consistent with impaired relaxation. Left ventrical global  hypokinesis without regional wall motion abnormalities.  2. The right ventricle has mildly reduced systolic function. The cavity was moderately enlarged. There is no increase in right ventricular wall thickness. Right ventricular systolic pressure is moderately elevated with an estimated pressure of 53 mmHg.  3. Right atrial size was moderately dilated.  4. Left atrial size was moderately dilated.  5. The aortic valve was not well visualized. Moderate calcification of the aortic valve. Aortic valve regurgitation was not assessed by color flow Doppler. Unable to estimate adegree of aortic valve stenosis given severely reduced systolic function.  6. Mechanical mitral valve. Mean gradient 5 mm Hg.  7. Tricuspid valve regurgitation is moderate.  8. Left pleural effusion noted, 6.5 cm  9. Rhythm is atrial tachycardia, rate 105 bpm   Patient Profile     84 y.o. female with a history of persistent atrial fibrillation s/p cardioversion (most recently 7/0/6237), chronic systolic congestive heart failure s/p CRT, mechanical mitral valve replacement on chronic Coumadin, mild aortic stenosis, stage IV kidney disease, COPD on home oxygen with recent prolonged hospitalization for spontaneous left  calf hematoma and acute blood loss anemia, and who was admitted from clinic due to acute on chronic systolic heart failure with volume overload and hypoxia.  Assessment & Plan    Acute on chronic systolic heart failure - Patient  continues to report improved breathing. Appears euvolemic on exam. - Daily BMET. Cr 1.14  1.35  1.43 (baseline Cr 1.2-1.3); BUN 21  22  24; K 3.7  3.6. Monitor renal function closely. --Started back on PTA oral Lasix 40 mg twice daily yesterday. Given overnight bump in renal function, will plan to hold her AM dose today 6/11 and resume Lasix 40mg  this evening. --Continue daily weights, I/Os. -2.4L for admission. Yesterday, -70cc. Wt 146.6lbs  142.7lbs. BP soft overnight but stable this AM and without symptoms. Continue to monitor.  -Continue lasix 40mg  BID (hold AM dose, resume at 6/11 evening dose), low-dose carvedilol, and losartan.  --Will plan for outpatient follow-up and labs, including BMET, after discharge.  Acute on chronic respiratory failure with hypoxia -As above, continued improvement in shortness of breath with diuresis. Started on oral Lasix 40 mg twice daily yesterday and holding today's AM dose as above.  -Wean oxygen as tolerated. Seen by pulmonology with note 6/10 recommending continued spirometer use  History of mitral valve replacement -INR 2.5  2.6.  --Goal INR 2-3 in setting of spontaneous L calf hematoma last month.  -Continue warfarin per pharmacy.  Chronic anemia - Daily CBC  Persistent Afib - Continue medical management with amiodarone, Coreg, anticoagulation with warfarin per pharmacy and INR goal as above.   For questions or updates, please contact [place] Please consult [email or web address] for contact info under        Signed, Arvil Chaco, PA-C  12/10/2018, 8:21 AM

## 2018-12-10 NOTE — Progress Notes (Signed)
[facility] NAME: [name]    MR#:  [id number]  DATE OF BIRTH:  1935-04-10  SUBJECTIVE:   Patient states she is breathing pretty close to her baseline.  She still peed a lot after we switch her to p.o. Lasix.  She states she does not want leave the hospital because she gets treated really well here.  REVIEW OF SYSTEMS:  Review of Systems  Constitutional: Positive for malaise/fatigue. Negative for chills, fever and weight loss.  HENT: Negative for congestion, hearing loss and sore throat.   Eyes: Negative for blurred vision and double vision.  Respiratory: Negative for cough, shortness of breath and wheezing.   Cardiovascular: Negative for chest pain, palpitations, orthopnea and leg swelling.  Gastrointestinal: Negative for abdominal pain, diarrhea, nausea and vomiting.  Genitourinary: Negative for dysuria and urgency.  Musculoskeletal: Negative for myalgias.  Skin: Negative for rash.  Neurological: Negative for dizziness, sensory change, speech change, focal weakness and headaches.  Psychiatric/Behavioral: Negative for depression.   DRUG ALLERGIES:   Allergies  Allergen Reactions  . Ace Inhibitors     Cough  . Doxycycline Nausea Only  . Hydrocodone Itching and Nausea Only  . Mercury     Other reaction(s): Unknown  . Silver Dermatitis    Severe itching  . Cephalexin Rash  . Clindamycin/Lincomycin Rash  . Nickel Rash  . Penicillins Rash    Has patient had a PCN reaction causing immediate rash, facial/tongue/throat swelling, SOB or lightheadedness with hypotension: YES Has patient had a PCN reaction causing severe rash involving mucus membranes or skin necrosis: NO Has patient had a PCN reaction that required hospitalization NO Has patient had a PCN reaction occurring within the last 10 years: NO If all of the above answers are "NO", then may proceed with Cephalosporin use.   VITALS:  Blood pressure 110/66,  pulse 87, temperature 97.7 F (36.5 C), temperature source Oral, resp. rate 19, height 4\' 11"  (1.499 m), weight 64.7 kg, SpO2 100 %. PHYSICAL EXAMINATION:   GENERAL:  84 y.o.-year-old patient lying in the bed with no acute distress.  EYES: Pupils equal, round, reactive to light and accommodation. No scleral icterus. Extraocular muscles intact.  HEENT: Head atraumatic, normocephalic. Oropharynx and nasopharynx clear.  NECK:  Supple, no jugular venous distention. No thyroid enlargement, no tenderness.  LUNGS: + Diminished breath sounds in the lung bases bilaterally. No use of accessory muscles of respiration.  CARDIOVASCULAR: RRR, S1, S2 normal. Click, loud systolic murmur, no rubs, or gallops.  ABDOMEN: Soft, nontender, nondistended. Bowel sounds present. No organomegaly or mass.  MUSCULOSKELETAL: No LE edema.  Ace wrap to bilateral legs, no cyanosis, or clubbing.  NEUROLOGIC:Alert and oriented x 3. CN 2-12 intact. Sensation intact to light touch. Gait not tested due to safety concern. PSYCHIATRIC: The patient is alert and oriented x 3.  SKIN: dry ACE bandage in place over left leg.  DATA REVIEWED:  LABORATORY PANEL:  Female CBC Recent Labs  Lab 12/10/18 0449  WBC 4.3  HGB 8.3*  HCT 28.0*  PLT 176   ------------------------------------------------------------------------------------------------------------------ Chemistries  Recent Labs  Lab 12/04/18 1954  12/10/18 0449  NA 139   < > 142  K 4.0   < > 3.6  CL 99   < > 101  CO2 30   < > 33*  GLUCOSE 98   < > 92  BUN 24*   < > 24*  CREATININE 1.23*   < > 1.43*  CALCIUM 8.9   < > 8.9  MG 2.2  --   --   AST 20  --   --   ALT 15  --   --   ALKPHOS 89  --   --   BILITOT 0.9  --   --    < > = values in this interval not displayed.   RADIOLOGY:  No results found. ASSESSMENT AND PLAN:   Acute on chronic combined systolic and diastolic CHF- improving. -Continue low dose coreg and losartan -Continue home lasix 40mg  po bid  -Daily weights, strict I/O -Cardiology following  Paroxysmal atrial fibrillation + Mechanical MVR- AV paced. INR at goal. -Continue coumadin and amiodarone  Chronic respiratory failure secondary to COPD-  no signs of exacerbation. Currently on home 3L O2. -Duonebs prn -Pulmonology consulted  Left lower extremity wound status post debridement of large hematoma -Dr. Bary Castilla following  Elevated troponin- likely demand ischemia. Troponins flat.   Hypertension- she has been normotensive -Continue low dose losartan and coreg   CKD III - creatinine better than baseline -Continue to monitor and avoid nephrotoxins  DVT prophylaxis- coumadin  Plan for discharge back to SNF tomorrow.  All the records are reviewed and case discussed with Care Management/Social Worker. Management plans discussed with the patient, family and they are in agreement.  CODE STATUS: Full Code  TOTAL TIME TAKING CARE OF THIS PATIENT: 35 minutes.   More than 50% of the time was spent in counseling/coordination of care: YES  POSSIBLE D/C IN 1-2 DAYS, DEPENDING ON CLINICAL CONDITION.   on 12/10/2018 at 12:16 PM  Between 7am to 6pm - Pager - [phone]  After 6pm go to [email or web address] - Proofreader  [facility]  Office  [phone]  CC: Primary care physician; Rusty Aus, MD  Note: This dictation was prepared with Dragon dictation along with smaller phrase technology. Any transcriptional errors that result from this process are unintentional.

## 2018-12-10 NOTE — Plan of Care (Signed)
  Problem: Clinical Measurements: Goal: Ability to maintain clinical measurements within normal limits will improve Outcome: Progressing   Problem: Activity: Goal: Risk for activity intolerance will decrease Outcome: Progressing   Problem: Pain Managment: Goal: General experience of comfort will improve Outcome: Progressing   Problem: Safety: Goal: Ability to remain free from injury will improve Outcome: Progressing   Problem: Skin Integrity: Goal: Risk for impaired skin integrity will decrease Outcome: Progressing   Problem: Activity: Goal: Capacity to carry out activities will improve Outcome: Progressing

## 2018-12-10 NOTE — Progress Notes (Signed)
Pulmonary Medicine          Date: 12/10/2018,   MRN# [id number] [name] 1935-06-30     AdmissionWeight: 66.5 kg                 CurrentWeight: 64.7 kg      SUBJECTIVE    Patient seems more confused and sleepy during evaluation this am. She had IV Benadryl for severe pruritus and QHS xanax which may be the reason, also poor sleep architecture while hospitalized otherwise some concern for possible hypercapnic encephalopathy due to contraction from diuresis, will repeat ABG today.  Have discussed care plan with daughter including possible discharge home in a.m.   PAST MEDICAL HISTORY   Past Medical History:  Diagnosis Date   Arthritis    "back, hands, knees" (04/10/2016)   Chronic back pain    Chronic combined systolic (congestive) and diastolic (congestive) heart failure (HCC)    a. EF 25% by echo in 08/2015 b. RHC in 08/2015 showed normal filling pressures; c. 10/2018 Echo (in setting of atrial tachycardia): EF 25-30%, impaired relaxation. Glob HK. RVSP 49mmHg. Mod dil RA/LA. Mech MV w/ nl fxn (mean grad 72mmHg). Mod TR.    Chronic kidney disease    Compression fracture    "several; all in my back" (04/10/2016)   GERD (gastroesophageal reflux disease)    Hypertension    Lymphedema    Pacemaker 05/2017   PAF (paroxysmal atrial fibrillation) ([place])    a. On amio & Coumadin - CHA2DS2VASc = 4.   S/P MVR (mitral valve replacement)    a. MVR 1994 b. redo MVR in 08/2014 - on Coumadin   Shortness of breath dyspnea      SURGICAL HISTORY   Past Surgical History:  Procedure Laterality Date   BREAST LUMPECTOMY Right 2005?   benign lump excision   CARDIAC CATHETERIZATION     CARDIAC VALVE REPLACEMENT  1994, 2016   "MVR; MVR"   CARDIOVERSION N/A 11/06/2018   Procedure: CARDIOVERSION;  Surgeon: Minna Merritts, MD;  Location: [facility];  Service: Cardiovascular;  Laterality: N/A;   CATARACT EXTRACTION W/PHACO Right 02/20/2015   Procedure:  CATARACT EXTRACTION PHACO AND INTRAOCULAR LENS PLACEMENT ([place]);  Surgeon: Estill Cotta, MD;  Location: [facility];  Service: Ophthalmology;  Laterality: Right;  US01:31.2 AP 25.3% CDE40.13 Fluid lot # [id number]   DRESSING CHANGE UNDER ANESTHESIA Left 11/17/2018   Procedure: DRESSING CHANGE;  Surgeon: Robert Bellow, MD;  Location: [facility];  Service: General;  Laterality: Left;  no anesthesia   ELECTROPHYSIOLOGIC STUDY N/A 10/09/2015   Procedure: CARDIOVERSION;  Surgeon: Wellington Hampshire, MD;  Location: [facility];  Service: Cardiovascular;  Laterality: N/A;   INCISION AND DRAINAGE OF WOUND Left 11/13/2018   Procedure: LEFT LEG DRESSING CHANGE;  Surgeon: Robert Bellow, MD;  Location: [facility];  Service: General;  Laterality: Left;   INCISION AND DRAINAGE OF WOUND Left 11/19/2018   Procedure: LEFT LEG DRESSING CHANGE AND GRAFT APPLICATION;  Surgeon: Robert Bellow, MD;  Location: [facility];  Service: General;  Laterality: Left;   IR GENERIC HISTORICAL  04/01/2016   IR RADIOLOGIST EVAL & MGMT 04/01/2016 [facility]   IR GENERIC HISTORICAL  04/15/2016   IR VERTEBROPLASTY CERV/THOR BX INC UNI/BIL INC/INJECT/IMAGING 04/15/2016 Luanne Bras, MD [facility]   IR GENERIC HISTORICAL  04/15/2016   IR VERTEBROPLASTY EA ADDL (T&LS) BX INC UNI/BIL INC INJECT/IMAGING 04/15/2016 Luanne Bras, MD [facility]   IR GENERIC HISTORICAL  04/15/2016   IR VERTEBROPLASTY EA ADDL (T&LS) BX INC UNI/BIL INC INJECT/IMAGING 04/15/2016 Luanne Bras, MD [facility]   IR GENERIC HISTORICAL  05/20/2016   IR RADIOLOGIST EVAL & MGMT 05/20/2016 [facility]   IR GENERIC HISTORICAL  06/13/2016   IR VERTEBROPLASTY EA ADDL (T&LS) BX INC UNI/BIL INC INJECT/IMAGING 06/13/2016 Luanne Bras, MD [facility]   IR GENERIC HISTORICAL  06/13/2016   IR SACROPLASTY BILATERAL 06/13/2016 Luanne Bras, MD [facility]   IR GENERIC HISTORICAL  06/13/2016   IR VERTEBROPLASTY LUMBAR BX INC  UNI/BIL INC/INJECT/IMAGING 06/13/2016 Luanne Bras, MD [facility]   IRRIGATION AND DEBRIDEMENT HEMATOMA Left 07/05/2015   Procedure: IRRIGATION AND DEBRIDEMENT HEMATOMA;  Surgeon: Robert Bellow, MD;  Location: [facility];  Service: General;  Laterality: Left;   IRRIGATION AND DEBRIDEMENT HEMATOMA Left 11/11/2018   Procedure: IRRIGATION AND DEBRIDEMENT LEFT LEG HEMATOMA;  Surgeon: Robert Bellow, MD;  Location: [facility];  Service: General;  Laterality: Left;   pyloric stenosis  07/1937   US ECHOCARDIOGRAPHY       FAMILY HISTORY   Family History  Problem Relation Age of Onset   Stroke Mother    Renal Disease Father      SOCIAL HISTORY   Social History   Tobacco Use   Smoking status: Former Smoker    Packs/day: 1.00    Years: 27.00    Pack years: 27.00    Types: Cigarettes    Quit date: 07/01/1980    Years since quitting: 38.4   Smokeless tobacco: Never Used   Tobacco comment: "quit smoking ~ 1980  Substance Use Topics   Alcohol use: Not Currently    Alcohol/week: 0.0 standard drinks    Comment: 04/10/2016 "I'll have a drink on holidays/special occasions"   Drug use: No     MEDICATIONS    Home Medication:    Current Medication:  Current Facility-Administered Medications:    0.9 %  sodium chloride infusion, 250 mL, Intravenous, PRN, Seals, Angela H, NP   acetaminophen (TYLENOL) tablet 650 mg, 650 mg, Oral, Q4H PRN, Seals, Angela H, NP, 650 mg at 12/05/18 2127   albuterol (PROVENTIL) (2.5 MG/3ML) 0.083% nebulizer solution 2.5 mg, 2.5 mg, Nebulization, Q6H PRN, Seals, Angela H, NP, 2.5 mg at 12/08/18 1638   ALPRAZolam (XANAX) tablet 0.25 mg, 0.25 mg, Oral, QHS PRN, Seals, Angela H, NP   amiodarone (PACERONE) tablet 200 mg, 200 mg, Oral, Daily, Seals, Angela H, NP, 200 mg at 12/10/18 0916   carvedilol (COREG) tablet 3.125 mg, 3.125 mg, Oral, BID WC, Arida, Muhammad A, MD, 3.125 mg at 12/10/18 6270   cholecalciferol (VITAMIN D) tablet 2,000  Units, 2,000 Units, Oral, Daily, Seals, Angela H, NP, 2,000 Units at 12/10/18 0916   diphenhydrAMINE (BENADRYL) capsule 25 mg, 25 mg, Oral, Q6H PRN, Mayo, Pete Pelt, MD, 25 mg at 12/09/18 1136   feeding supplement (ENSURE ENLIVE) (ENSURE ENLIVE) liquid 237 mL, 237 mL, Oral, BID BM, Mayo, Pete Pelt, MD, 237 mL at 12/07/18 1435   furosemide (LASIX) tablet 40 mg, 40 mg, Oral, BID, Visser, Jacquelyn D, PA-C, 40 mg at 12/09/18 1729   hydrOXYzine (ATARAX/VISTARIL) tablet 25 mg, 25 mg, Oral, TID PRN, Mayo, Pete Pelt, MD, 25 mg at 12/10/18 0302   ipratropium-albuterol (DUONEB) 0.5-2.5 (3) MG/3ML nebulizer solution 3 mL, 3 mL, Nebulization, Q6H PRN, Seals, Angela H, NP   losartan (COZAAR) tablet 12.5 mg, 12.5 mg, Oral, Daily, Arida, Muhammad A, MD, 12.5 mg at 12/10/18 0916   ondansetron (ZOFRAN) injection  4 mg, 4 mg, Intravenous, Q6H PRN, Seals, Levada Dy H, NP   oxyCODONE (Oxy IR/ROXICODONE) immediate release tablet 2.5 mg, 2.5 mg, Oral, Q6H PRN, Seals, Angela H, NP, 2.5 mg at 12/08/18 1754   polyethylene glycol (MIRALAX / GLYCOLAX) packet 17 g, 17 g, Oral, Daily, Seals, Angela H, NP, 17 g at 12/09/18 7619   potassium chloride SA (K-DUR) CR tablet 20 mEq, 20 mEq, Oral, Daily, Seals, Angela H, NP, 20 mEq at 12/10/18 0916   sodium chloride flush (NS) 0.9 % injection 3 mL, 3 mL, Intravenous, Q12H, Seals, Angela H, NP, 3 mL at 12/09/18 2152   sodium chloride flush (NS) 0.9 % injection 3 mL, 3 mL, Intravenous, PRN, Seals, [name] Dills, NP   vitamin C (ASCORBIC ACID) tablet 500 mg, 500 mg, Oral, Daily, Seals, Angela H, NP, 500 mg at 12/10/18 5093   warfarin (COUMADIN) tablet 4 mg, 4 mg, Oral, ONCE-1800, Duncan, Asajah R, RPH   Warfarin - Pharmacist Dosing Inpatient, , Does not apply, q1800, Mayo, Pete Pelt, MD   zinc sulfate capsule 220 mg, 220 mg, Oral, Daily, Seals, Angela H, NP, 220 mg at 12/10/18 2671    ALLERGIES   Ace inhibitors, Doxycycline, Hydrocodone, Mercury, Silver, Cephalexin,  Clindamycin/lincomycin, Nickel, and Penicillins     REVIEW OF SYSTEMS    Review of Systems:  Gen:  Denies  fever, sweats, chills weigh loss  HEENT: Denies blurred vision, double vision, ear pain, eye pain, hearing loss, nose bleeds, sore throat Cardiac:  No dizziness, chest pain or heaviness, chest tightness,edema Resp: Reports persistent shortness of breath Gi: Denies swallowing difficulty, stomach pain, nausea or vomiting, diarrhea, constipation, bowel incontinence Gu:  Denies bladder incontinence, burning urine Ext:   Denies Joint pain, stiffness or swelling Skin: Denies  skin rash, easy bruising or bleeding or hives Endoc:  Denies polyuria, polydipsia , polyphagia or weight change Psych:   Denies depression, insomnia or hallucinations   Other:  All other systems negative   VS: BP 110/66 (BP Location: Right Arm)    Pulse 87    Temp 97.7 F (36.5 C) (Oral)    Resp 19    Ht 4\' 11"  (1.499 m)    Wt 64.7 kg    SpO2 100%    BMI 28.82 kg/m      PHYSICAL EXAM    GENERAL:NAD, no fevers, chills, no weakness no fatigue HEAD: Normocephalic, atraumatic.  EYES: Pupils equal, round, reactive to light. Extraocular muscles intact. No scleral icterus.  MOUTH: Moist mucosal membrane. Dentition intact. No abscess noted.  EAR, NOSE, THROAT: Clear without exudates. No external lesions.  NECK: Supple. No thyromegaly. No nodules. No JVD.  PULMONARY: Diffuse coarse rhonchi right sided +wheezes CARDIOVASCULAR: S1 and S2. Regular rate and rhythm. 4/6 holosystolic murmur rubs, or gallops. No edema. Pedal pulses 2+ bilaterally.  GASTROINTESTINAL: Soft, nontender, nondistended. No masses. Positive bowel sounds. No hepatosplenomegaly.  MUSCULOSKELETAL: No swelling, clubbing, or edema. Range of motion full in all extremities.  NEUROLOGIC: Cranial nerves II through XII are intact. No gross focal neurological deficits. Sensation intact. Reflexes intact.  SKIN: No ulceration, lesions, rashes, or cyanosis.  Skin warm and dry. Turgor intact.  PSYCHIATRIC: The patient is sleepy      IMAGING    Dg Chest 1 View  Result Date: 12/08/2018 CLINICAL DATA:  Shortness of breath EXAM: CHEST  1 VIEW COMPARISON:  12/07/2018 FINDINGS: Prior median sternotomy and valve replacement. Left pacer remains in place, unchanged. Cardiomegaly with vascular congestion. Diffuse interstitial and airspace opacities  lung with small effusions again noted. No real change. IMPRESSION: No significant change since prior study. Electronically Signed   By: Rolm Baptise M.D.   On: 12/08/2018 08:50   Dg Chest 1 View  Result Date: 12/07/2018 CLINICAL DATA:  Shortness of breath EXAM: CHEST  1 VIEW COMPARISON:  12/06/2018 FINDINGS: Cardiac shadow is enlarged but stable. Pacing device and postsurgical changes are again seen. Small effusions are again seen and stable. Patchy infiltrates are again noted right greater than left but stable from the prior exam. No new focal abnormality is noted. IMPRESSION: Stable airspace opacities right greater than left with associated small effusions. Electronically Signed   By: Inez Catalina M.D.   On: 12/07/2018 07:30   Dg Chest 1 View  Result Date: 12/06/2018 CLINICAL DATA:  Shortness of breath.  Lower extremity edema. EXAM: CHEST  1 VIEW COMPARISON:  Chest radiograph 12/05/2018 FINDINGS: Multi lead pacer apparatus overlies the left hemithorax. Leads are stable in position. Stable cardiomegaly. Similar-appearing patchy areas of consolidation within the mid and lower lungs bilaterally. Bilateral small to moderate pleural effusions. No pneumothorax. IMPRESSION: Cardiomegaly. Moderate bilateral pleural effusions with underlying consolidation favored to represent combination of atelectasis and edema. Superimposed infection not excluded. Electronically Signed   By: Lovey Newcomer M.D.   On: 12/06/2018 06:44   Dg Chest 2 View  Result Date: 12/06/2018 CLINICAL DATA:  84 year old female with history of hepatocellular  carcinoma. Hypertension and shortness of breath. EXAM: CHEST - 2 VIEW COMPARISON:  Chest x-ray 11/13/2018. FINDINGS: Lung volumes are low. Moderate bilateral pleural effusions. Bibasilar areas of atelectasis and/or consolidation. Cephalization of the pulmonary vasculature with mild diffuse increased interstitial markings. Mild cardiomegaly. Upper mediastinal contours are within normal limits given the low lung volumes and patient rotation to the right. Aortic atherosclerosis. Status post median sternotomy. Left-sided biventricular pacemaker in position with lead tips projecting over the expected location of the right atrium, right ventricular apex and left ventricle (via the coronary sinus and coronary veins). Status post mitral valve replacement. Post vertebroplasty changes in midthoracic vertebral bodies again noted. IMPRESSION: 1. The appearance the chest suggests worsening congestive heart failure, as above. 2. Bibasilar opacities may reflect areas of atelectasis and/or consolidation. 3. Aortic atherosclerosis. Electronically Signed   By: Vinnie Langton M.D.   On: 12/06/2018 19:48   Dg Chest 2 View  Result Date: 11/13/2018 CLINICAL DATA:  84 year old female with shortness of breath. Traumatic bleeding recently, on Coumadin. EXAM: CHEST - 2 VIEW COMPARISON:  11/04/2018 and earlier. FINDINGS: Seated AP and lateral views of the chest. Stable cardiomegaly and mediastinal contours. Previous cardiac valve replacement. Stable left chest pacemaker. Regressed bilateral pulmonary interstitial opacity. Mild residual patchy opacity now about the left hilum and in the right upper lung. On the lateral view there are small bilateral pleural effusions with some fluid tracking into the minor fissure. Osteopenia with progressed thoracic compression fractures since 2017. Two levels previously augmented. Negative visible bowel gas pattern. IMPRESSION: 1. Regressed pulmonary edema since 11/04/2018. Small bilateral pleural  effusions. 2. Patchy nonspecific perihilar opacity might reflect residual edema. Acute infection difficult to exclude. Electronically Signed   By: Genevie Ann M.D.   On: 11/13/2018 10:40   Dg Chest Port 1 View  Result Date: 12/05/2018 CLINICAL DATA:  Congestive heart failure. EXAM: PORTABLE CHEST 1 VIEW COMPARISON:  11/13/2018 FINDINGS: There is a new airspace opacity that appears to be centered within the right middle lobe. The cardiac size is enlarged. The lung volumes are low. There  are additional hazy airspace opacities bilaterally with prominent interstitial lung markings. There is a multi lead left-sided pacemaker in place. The patient is status post prior mitral valve replacement. There are probable small bilateral pleural effusions. The patient appears to be status post multilevel vertebral augmentation. IMPRESSION: 1. Airspace opacity in the right middle lobe concerning for a developing infiltrate or atelectasis. 2. Cardiomegaly with volume overload and small bilateral pleural effusions. Electronically Signed   By: Constance Holster M.D.   On: 12/05/2018 03:24   Dg Foot Complete Left  Result Date: 11/24/2018 CLINICAL DATA:  Lateral left foot pain for 2-3 days with a wound and bruising. EXAM: LEFT FOOT - COMPLETE 3+ VIEW COMPARISON:  None. FINDINGS: The bones are diffusely osteopenic. There is minimal soft tissue prominence at the base of the fifth metatarsal. No acute fracture, dislocation, osseous erosion, soft tissue emphysema, radiopaque foreign body is identified. Extensive atherosclerotic vascular calcifications are noted. IMPRESSION: No acute osseous abnormality identified. Electronically Signed   By: Logan Bores M.D.   On: 11/24/2018 17:25      ASSESSMENT/PLAN      Acute on chronic hypoxemic respiratory failure -Due to acute decompensated systolic CHF with EF 25 to 30% -Cardiology on case appreciate input-follow recommendations -Currently being diuresed 40 mg IV twice daily of  Lasix, will add IV albumin to increase efficacy -Complicated by CKD and significant chronic anemia- will ask nephro to evaluate patient as she is higher risk for progressing to ESRD -will obtain ABG if hypercapnic may require nocturnal BiPAP -Discussed with Sunday Corn from [employer] to qualify for vest therapy/noninvasive ventilation at [facility]   Bilateral atelectasis -Contributing to hypoxemia - chest PT - MetaNeb BID - Incentive spirometer to be used each hour multiple times - please encourage to use  - patient has difficulty expectorating phlegm -continue Acapella     Moderate protein calorie malnutrition and deconditioning -Contributing to dyspnea -Patient working with physical therapy was able to get out of bed and slowly walk today but encouraged to continue this as well as outpatient -Nutritional assessment patient has been aspirating and has been evaluated with modified barium swallow recommended aspiration precautions- will place consultation for dietitian eval -has ensure at bedside        Thank you for allowing me to participate in the care of this patient.     Patient/Family are satisfied with care plan and all questions have been answered.   This document was prepared using Dragon voice recognition software and may include unintentional dictation errors.     Ottie Glazier, M.D.  Division of [place]

## 2018-12-10 NOTE — Care Management Important Message (Signed)
Important Message  Patient Details  Name: [name] MRN: [id number] Date of Birth: [birth year]   Medicare Important Message Given:  Yes    [name] 12/10/2018, 1:16 PM

## 2018-12-10 NOTE — Progress Notes (Signed)
Physical Therapy Treatment Patient Details Name: [name] MRN: [id number] DOB: 1935-03-02 Today's Date: 12/10/2018    History of Present Illness Jessicia Napolitano is an 77yoF who comes to [facility] from cardiology after a/c exacerbation of sCHF. Pt has been at [facility] for <1W s/p DC from this site for a similar problem. PMH: AF, chronic back pain, combined CHF, CKD, compression fracture of spine, PPM, MR, pulmonary HTN, LEE, chronic O2 use 3L, recently 4-6L.     PT Comments    Patient up in chair upon arrival to room; daughter at bedside.  More alert and engaged than noted during previous sessions, eager to walk with therapist today.  Participates well with therex and gait trials, completing full lap around nursing station this date (per patient request), all at cga/min assist level of care from therapist.  Minimal SOB with exertion, sats 84-85% on 5L (with exertion), recovering to >89-90% within 30-60 seconds.  Returned to 3L for rest.  Patient appears encouraged and much more comfortable with gait efforts; daughter pleased with progress. Given noted improvement this session, may consider upgrade in discharge recommendations if progress persists.  Will continue to monitor and update as appropriate. Home single level with ramp entry; has access to hospital bed, 1/2 bath, lift chair on main level of home.  Also given noted improvement in gait efforts, encouraged patient to attempt gait trial (with RW and staff assist) 2x/day (once in AM, once in PM) for remainder of stay for continued progression of functional strength/endurnance.  Patient, daughter, unit director aware of recommendations moving forward.   Follow Up Recommendations  SNF(may progress to HHPT)     Equipment Recommendations       Recommendations for Other Services       Precautions / Restrictions Precautions Precautions: Fall Precaution Comments: Per Dr Bary Castilla, pt has no weight bearing  precautions Restrictions Weight Bearing Restrictions: No    Mobility  Bed Mobility               General bed mobility comments: seated in standard chair beginning/end of treatment session  Transfers Overall transfer level: Needs assistance Equipment used: Rolling walker (2 wheeled) Transfers: Sit to/from Stand Sit to Stand: Min guard;Min assist         General transfer comment: heavy use of bilat UEs to assist with lift off and anteiror weight translation  Ambulation/Gait Ambulation/Gait assistance: Min guard;Min assist Gait Distance (Feet): (200' x1, 100' x1) Assistive device: Rolling walker (2 wheeled)       General Gait Details: reciprocal stepping pattern, narrowed BOS, forward trunk flexion; R posterior lateral hip weakness with associated trendelenburg gait pattern. Minimal SOB with exertion, sats 84-85% on 5L (with exertion), recovering to >89-90% within 30-60 seconds.  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)       Balance Overall balance assessment: Needs assistance Sitting-balance support: No upper extremity supported;Feet supported Sitting balance-Leahy Scale: Good     Standing balance support: Bilateral upper extremity supported Standing balance-Leahy Scale: Fair                              Cognition Arousal/Alertness: Awake/alert Behavior During Therapy: WFL for tasks assessed/performed Overall Cognitive Status: Within Functional Limits for tasks assessed  Exercises Other Exercises Other Exercises: Standing LE therex, 1x10, AROM with RW, cga/min assist-min cuing for R hip stability in modified SLS (heel raises, marching, hip flex/ext/abduct/adduct) Other Exercises: Sit/stand x5 with RW, min assist progressing to cga/close sup with repetition-emphasis on hand placement, trunk lean and anterior weight  translation    General Comments        Pertinent Vitals/Pain Pain Assessment: No/denies pain    Home Living                      Prior Function            PT Goals (current goals can now be found in the care plan section) Acute Rehab PT Goals Patient Stated Goal: to walk and get back to my old routines PT Goal Formulation: With patient Time For Goal Achievement: 12/19/18 Potential to Achieve Goals: Fair Progress towards PT goals: Progressing toward goals    Frequency    Min 2X/week      PT Plan Current plan remains appropriate    Co-evaluation              AM-PAC PT "6 Clicks" Mobility   Outcome Measure  Help needed turning from your back to your side while in a flat bed without using bedrails?: A Little Help needed moving from lying on your back to sitting on the side of a flat bed without using bedrails?: A Little Help needed moving to and from a bed to a chair (including a wheelchair)?: A Little Help needed standing up from a chair using your arms (e.g., wheelchair or bedside chair)?: A Little Help needed to walk in hospital room?: A Little Help needed climbing 3-5 steps with a railing? : A Little 6 Click Score: 18    End of Session Equipment Utilized During Treatment: Gait belt;Oxygen Activity Tolerance: Patient tolerated treatment well Patient left: in chair;with family/visitor present(patient insistent on sitting in standard chair on back side of room (away from alarm system).  Daughter in room.  Both aware and understand need for staff assist with mobility efforts.) Nurse Communication: Mobility status PT Visit Diagnosis: Other abnormalities of gait and mobility (R26.89);Muscle weakness (generalized) (M62.81);Difficulty in walking, not elsewhere classified (R26.2);Pain     Time: 1435-1501 PT Time Calculation (min) (ACUTE ONLY): 26 min  Charges:  $Gait Training: 8-22 mins $Therapeutic Exercise: 8-22 mins            Vivyan Biggers H. Owens Shark,  PT, DPT, NCS 12/10/18, 4:48 PM [phone]

## 2018-12-10 NOTE — TOC Progression Note (Signed)
Transition of Care [facility]) - Progression Note    Patient Details  Name: [name] MRN: [id number] Date of Birth: 1934/07/16  Transition of Care [facility]) CM/SW Contact  Elza Rafter, RN Phone Number: 12/10/2018, 11:50 AM  Clinical Narrative:    Spoke with patient and daughter [name].  The plan is for discharge back to [facility].  Seth Bake aware.  Seth Bake will be off tomorrow; please contact Raquel Sarna with [facility] tomorrow to arrange discharge [phone].  Seth Bake states they can take her back tomorrow.  COVID ordered per Dr. Brett Albino.      Expected Discharge Plan: Skilled Nursing Facility Barriers to Discharge: Continued Medical Work up  Expected Discharge Plan and Services Expected Discharge Plan: [place]   Discharge Planning Services: CM Consult Post Acute Care Choice: [place] Living arrangements for the past 2 months: Skilled Nursing Facility(for the last week)                                       Social Determinants of Health (SDOH) Interventions    Readmission Risk Interventions Readmission Risk Prevention Plan 12/07/2018 11/30/2018 11/09/2018  Transportation Screening Complete Complete Complete  PCP or Specialist Appt within 3-5 Days Complete - Complete  HRI or Home Care Consult Complete - Complete  Social Work Consult for [place] Planning/Counseling Not Complete - Not Complete  SW consult not completed comments NA - na  Palliative Care Screening Not Applicable - Not Applicable  Medication Review Press photographer) Complete Complete Complete  PCP or Specialist appointment within 3-5 days of discharge - Complete -  [place] or [place] - Complete -  SW Recovery Care/Counseling Consult - Not Complete -  SW Consult Not Complete Comments - na -  Palliative Care Screening - Not Applicable -  [place] - Complete -  Some recent data might be hidden

## 2018-12-10 NOTE — Plan of Care (Signed)
  Problem: Clinical Measurements: Goal: Ability to maintain clinical measurements within normal limits will improve Outcome: Progressing   Problem: Activity: Goal: Risk for activity intolerance will decrease Outcome: Progressing   Problem: Pain Managment: Goal: General experience of comfort will improve Outcome: Progressing   Problem: Skin Integrity: Goal: Risk for impaired skin integrity will decrease Outcome: Progressing

## 2018-12-10 NOTE — Consult Note (Signed)
[place] for warfarin dose management Indication: atrial fibrillation  Patient Measurements: Height: 4\' 11"  (149.9 cm) Weight: 142 lb 11.2 oz (64.7 kg) IBW/kg (Calculated) : 43.2  Vital Signs: Temp: 98.2 F (36.8 C) (06/11 0429) Temp Source: Oral (06/11 0429) BP: 111/67 (06/11 0429) Pulse Rate: 87 (06/11 0429)  Labs: Recent Labs    12/08/18 0904 12/09/18 0422 12/10/18 0449  HGB  --  8.0* 8.3*  HCT  --  27.0* 28.0*  PLT  --  159 176  LABPROT 26.3* 27.4* 26.7*  INR 2.5* 2.6* 2.5*  CREATININE 1.14* 1.35* 1.43*    Estimated Creatinine Clearance: 25.2 mL/min (A) (by C-G formula based on SCr of 1.43 mg/dL (H)).   Medical History: Past Medical History:  Diagnosis Date  . Arthritis    "back, hands, knees" (04/10/2016)  . Chronic back pain   . 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 EF 25% by echo in 08/2015 b. RHC in 08/2015 showed normal filling pressures; c. 10/2018 Echo (in setting of atrial tachycardia): EF 25-30%, impaired relaxation. Glob HK. RVSP 44mmHg. Mod dil RA/LA. Mech MV w/ nl fxn (mean grad 72mmHg). Mod TR.   Marland Kitchen Chronic kidney disease   . Compression fracture    "several; all in my back" (04/10/2016)  . GERD (gastroesophageal reflux disease)   . Hypertension   . Lymphedema   . Pacemaker 05/2017  . PAF (paroxysmal atrial fibrillation) ([place])    a. On amio & Coumadin - CHA2DS2VASc = 4.  . S/P MVR (mitral valve replacement)    a. MVR 1994 b. redo MVR in 08/2014 - on Coumadin  . Shortness of breath dyspnea     Medications:  Scheduled:  . amiodarone  200 mg Oral Daily  . carvedilol  3.125 mg Oral BID WC  . cholecalciferol  2,000 Units Oral Daily  . feeding supplement (ENSURE ENLIVE)  237 mL Oral BID BM  . furosemide  40 mg Oral BID  . losartan  12.5 mg Oral Daily  . polyethylene glycol  17 g Oral Daily  . potassium chloride SA  20 mEq Oral Daily  . sodium chloride  flush  3 mL Intravenous Q12H  . vitamin C  500 mg Oral Daily  . Warfarin - Pharmacist Dosing Inpatient   Does not apply q1800  . zinc sulfate  220 mg Oral Daily    Assessment: Pt was on warfarin previously for atrial fibrillation with mechanical mitral valve. On admission patient's INR was 3.1 (up to 3.6 today). Pt is on amiodarone which can increase INR.   Home warfarin dose: At home patient takes 4 mg daily- she is a resident at [facility] in [place]. I called the facility to confirm the dose changed from prior admission.   Today's INR is therapeutic.    --DDI with amiodarone (home med)   6/5 INR 3.1 6/6 INR 3.6  HELD 6/7 INR 3.0 (3 mg given) 6/8 INR 2.6 (4 mg given) 6/9 INR 2.5 (4 mg given) 6/10 INR 2.6 (4 mg given) 6/11 INR 2.5  Goal of Therapy:  INR 2-3 (per cardiology on previous visit d/t bleed during that admission, even with valve). Update: Discussed with Dr.  Saunders Revel INR goal. Will continue to keep goal of 2-3, but will favor an estimated range of 2.5-3. Monitor platelets by anticoagulation protocol: Yes   Plan:  Will order warfarin 4 mg x 1  and repeat INR in AM.  Rowland Lathe, PharmD 12/10/2018,7:51 AM

## 2018-12-10 NOTE — Consult Note (Signed)
[facility] CONSULT NOTE    Date: 12/10/2018                  Patient Name:  [name]  MRN: [id number]  DOB: 1935/03/30  Age / Sex: 84 y.o., female         PCP: Rusty Aus, MD                 Service Requesting Consult: Pulmonology                 Reason for Consult: CKD stage 3            History of Present Illness: Patient is a 84 y.o. female with a PMHx of osteoarthritis, chronic back pain, chronic systolic heart failure ejection fraction 25%, chronic kidney disease stage III, GERD, hypertension, lymphedema, paroxysmal atrial fibrillation, mitral valve replacement, who was admitted to [facility] on 12/04/2018 for evaluation of increasing shortness of breath.  This has been progressive in nature.  She is followed by Dr. Sabra Heck as an outpatient.  She also recently experienced a left lower extremity hematoma.  In regards to the underlying shortness of breath she was found to have elevated BNP of 1189.  We are asked to see her for evaluation management of chronic kidney disease stage III.  Earlier in the admission her creatinine was found to be 1.14 with an EGFR 45.  Her creatinine is a bit higher at the moment at 1.43 with an EGFR of 34.   Medications: Outpatient medications: Medications Prior to Admission  Medication Sig Dispense Refill Last Dose  . acetaminophen (TYLENOL) 650 MG CR tablet Take 1 tablet (650 mg total) by mouth every 6 (six) hours as needed for pain (mild and moderate pain).   Unknown at PRN  . albuterol (PROVENTIL) (2.5 MG/3ML) 0.083% nebulizer solution Take 3 mLs (2.5 mg total) by nebulization every 6 (six) hours as needed for wheezing or shortness of breath. 75 mL 12 Unknown at PRN  . ALPRAZolam (XANAX) 0.25 MG tablet Take 1 tablet (0.25 mg total) by mouth at bedtime as needed for sleep. 20 tablet 0 Unknown at PRN  . amiodarone (PACERONE) 200 MG tablet Take 1 tablet (200 mg total) by mouth daily. 90 tablet 3 12/04/2018 at 0800  .  Cholecalciferol (VITAMIN D) 2000 units tablet Take 2,000 Units by mouth daily.   12/04/2018 at Unknown time  . furosemide (LASIX) 40 MG tablet Take 1 tablet (40 mg total) by mouth 2 (two) times daily. 60 tablet 1 12/04/2018 at 0800  . losartan (COZAAR) 25 MG tablet Take 25 mg by mouth daily.   12/04/2018 at 0800  . Menthol (ICY HOT) 5 % PTCH Apply 1 patch topically daily as needed (pain).   Unknown at PRN  . oxyCODONE (OXY IR/ROXICODONE) 5 MG immediate release tablet Take 0.5 tablets (2.5 mg total) by mouth every 6 (six) hours as needed for severe pain. 20 tablet 0 Unknown at PRN  . polyethylene glycol (MIRALAX / GLYCOLAX) 17 g packet Take 17 g by mouth daily. 14 each 0 Unknown at Unknown  . potassium chloride SA (K-DUR) 20 MEQ tablet Take 1 tablet (20 mEq total) by mouth daily. 30 tablet 0 12/04/2018 at 0800  . Probiotic Product (RISA-BID PROBIOTIC PO) Take 1 capsule by mouth daily.    12/04/2018 at Unknown time  . vitamin C (ASCORBIC ACID) 500 MG tablet Take 500 mg by mouth daily.   12/04/2018 at Unknown time  .  warfarin (COUMADIN) 4 MG tablet Take 1 tablet (4 mg total) by mouth one time only at 6 PM. 30 tablet 1 Past Week at Unknown time  . zinc sulfate 220 (50 Zn) MG capsule Take 1 capsule (220 mg total) by mouth daily. 30 capsule 1 12/04/2018 at 0800    Current medications: Current Facility-Administered Medications  Medication Dose Route Frequency Provider Last Rate Last Dose  . 0.9 %  sodium chloride infusion  250 mL Intravenous PRN Seals, Levada Dy H, NP      . acetaminophen (TYLENOL) tablet 650 mg  650 mg Oral Q4H PRN Mayer Camel, NP   650 mg at 12/05/18 2127  . albuterol (PROVENTIL) (2.5 MG/3ML) 0.083% nebulizer solution 2.5 mg  2.5 mg Nebulization Q6H PRN Seals, Angela H, NP   2.5 mg at 12/08/18 1638  . ALPRAZolam Duanne Moron) tablet 0.25 mg  0.25 mg Oral QHS PRN Seals, [name] Dills, NP      . amiodarone (PACERONE) tablet 200 mg  200 mg Oral Daily Seals, Levada Dy H, NP   200 mg at 12/10/18 0916  . carvedilol  (COREG) tablet 3.125 mg  3.125 mg Oral BID WC Kathlyn Sacramento A, MD   3.125 mg at 12/10/18 1712  . cholecalciferol (VITAMIN D) tablet 2,000 Units  2,000 Units Oral Daily Mayer Camel, NP   2,000 Units at 12/10/18 0916  . diphenhydrAMINE (BENADRYL) capsule 25 mg  25 mg Oral Q6H PRN Mayo, Pete Pelt, MD   25 mg at 12/09/18 1136  . feeding supplement (ENSURE ENLIVE) (ENSURE ENLIVE) liquid 237 mL  237 mL Oral BID BM Mayo, Pete Pelt, MD   237 mL at 12/07/18 1435  . furosemide (LASIX) tablet 40 mg  40 mg Oral BID Marrianne Mood D, PA-C   40 mg at 12/09/18 1729  . hydrOXYzine (ATARAX/VISTARIL) tablet 25 mg  25 mg Oral TID PRN Sela Hua, MD   25 mg at 12/10/18 0302  . ipratropium-albuterol (DUONEB) 0.5-2.5 (3) MG/3ML nebulizer solution 3 mL  3 mL Nebulization Q6H PRN Seals, Angela H, NP   3 mL at 12/10/18 1347  . losartan (COZAAR) tablet 12.5 mg  12.5 mg Oral Daily Kathlyn Sacramento A, MD   12.5 mg at 12/10/18 0916  . ondansetron (ZOFRAN) injection 4 mg  4 mg Intravenous Q6H PRN Seals, Levada Dy H, NP      . oxyCODONE (Oxy IR/ROXICODONE) immediate release tablet 2.5 mg  2.5 mg Oral Q6H PRN Gardiner Barefoot H, NP   2.5 mg at 12/08/18 1754  . polyethylene glycol (MIRALAX / GLYCOLAX) packet 17 g  17 g Oral Daily Seals, Levada Dy H, NP   17 g at 12/09/18 0933  . potassium chloride SA (K-DUR) CR tablet 20 mEq  20 mEq Oral Daily Seals, [name] Dills, NP   20 mEq at 12/10/18 0916  . sodium chloride flush (NS) 0.9 % injection 3 mL  3 mL Intravenous Q12H Seals, Angela H, NP   3 mL at 12/10/18 2038  . sodium chloride flush (NS) 0.9 % injection 3 mL  3 mL Intravenous PRN Seals, Levada Dy H, NP      . vitamin C (ASCORBIC ACID) tablet 500 mg  500 mg Oral Daily Seals, Angela H, NP   500 mg at 12/10/18 0916  . Warfarin - Pharmacist Dosing Inpatient   Does not apply q1800 Mayo, Pete Pelt, MD      . zinc sulfate capsule 220 mg  220 mg Oral Daily Seals, [name] Dills, NP  220 mg at 12/10/18 0916      Allergies: Allergies  Allergen  Reactions  . Ace Inhibitors     Cough  . Doxycycline Nausea Only  . Hydrocodone Itching and Nausea Only  . Mercury     Other reaction(s): Unknown  . Silver Dermatitis    Severe itching  . Cephalexin Rash  . Clindamycin/Lincomycin Rash  . Nickel Rash  . Penicillins Rash    Has patient had a PCN reaction causing immediate rash, facial/tongue/throat swelling, SOB or lightheadedness with hypotension: YES Has patient had a PCN reaction causing severe rash involving mucus membranes or skin necrosis: NO Has patient had a PCN reaction that required hospitalization NO Has patient had a PCN reaction occurring within the last 10 years: NO If all of the above answers are "NO", then may proceed with Cephalosporin use.      Past Medical History: Past Medical History:  Diagnosis Date  . Arthritis    "back, hands, knees" (04/10/2016)  . Chronic back pain   . 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 EF 25% by echo in 08/2015 b. RHC in 08/2015 showed normal filling pressures; c. 10/2018 Echo (in setting of atrial tachycardia): EF 25-30%, impaired relaxation. Glob HK. RVSP 73mHg. Mod dil RA/LA. Mech MV w/ nl fxn (mean grad 561mg). Mod TR.   . Marland Kitchenhronic kidney disease   . Compression fracture    "several; all in my back" (04/10/2016)  . GERD (gastroesophageal reflux disease)   . Hypertension   . Lymphedema   . Pacemaker 05/2017  . [place]   a. On amio & Coumadin - CHA2DS2VASc = 4.  . S/P MVR (mitral valve replacement)    a. MVR 1994 b. redo MVR in 08/2014 - on Coumadin  . Shortness of breath dyspnea      Past Surgical History: Past Surgical History:  Procedure Laterality Date  . BREAST LUMPECTOMY Right 2005?   benign lump excision  . CARDIAC CATHETERIZATION    . [place]   "MVR; MVR"  . CARDIOVERSION N/A 11/06/2018   Procedure: CARDIOVERSION;  Surgeon: GoMinna Merritts MD;  Location: [facility] ORS;  Service: Cardiovascular;  Laterality: N/A;  . CATARACT EXTRACTION W/PHACO Right 02/20/2015   Procedure: CATARACT EXTRACTION PHACO AND [place]TRAOCULAR LENS PLACEMENT ([place]  Surgeon: StEstill CottaMD;  Location: [facility] ORS;  Service: Ophthalmology;  Laterality: Right;  US01:31.2 AP 25.3% CDE40.13 Fluid lot # [id number]  . DRESSING CHANGE UNDER ANESTHESIA Left 11/17/2018   Procedure: DRESSING CHANGE;  Surgeon: ByRobert BellowMD;  Location: [facility] ORS;  Service: General;  Laterality: Left;  no anesthesia  . ELECTROPHYSIOLOGIC STUDY N/A 10/09/2015   Procedure: CARDIOVERSION;  Surgeon: MuWellington HampshireMD;  Location: [facility] ORS;  Service: Cardiovascular;  Laterality: N/A;  . INCISION AND DRAINAGE OF WOUND Left 11/13/2018   Procedure: LEFT LEG DRESSING CHANGE;  Surgeon: ByRobert BellowMD;  Location: [facility] ORS;  Service: General;  Laterality: Left;  . INCISION AND DRAINAGE OF WOUND Left 11/19/2018   Procedure: LEFT LEG DRESSING CHANGE AND GRAFT APPLICATION;  Surgeon: ByRobert BellowMD;  Location: [facility] ORS;  Service: General;  Laterality: Left;  . IR GENERIC HISTORICAL  04/01/2016   IR RADIOLOGIST EVAL & MGMT 04/01/2016 [facility]  . IR GENERIC HISTORICAL  04/15/2016   IR VERTEBROPLASTY CERV/THOR BX INC UNI/BIL INC/INJECT/IMAGING 04/15/2016 SaLuanne BrasMD [facility]  . IR GENERIC HISTORICAL  04/15/2016   IR VERTEBROPLASTY EA ADDL (T&LS) BX INC UNI/BIL INC INJECT/IMAGING 04/15/2016 Luanne Bras, MD [facility]  . IR GENERIC HISTORICAL  04/15/2016   IR VERTEBROPLASTY EA ADDL (T&LS) BX INC UNI/BIL INC INJECT/IMAGING 04/15/2016 Luanne Bras, MD [facility]  . IR GENERIC HISTORICAL  05/20/2016   IR RADIOLOGIST EVAL & MGMT 05/20/2016 [facility]  . IR GENERIC HISTORICAL  06/13/2016   IR VERTEBROPLASTY EA ADDL (T&LS) BX INC UNI/BIL INC INJECT/IMAGING 06/13/2016 Luanne Bras, MD [facility]  . IR GENERIC HISTORICAL  06/13/2016   IR SACROPLASTY  BILATERAL 06/13/2016 Luanne Bras, MD [facility]  . IR GENERIC HISTORICAL  06/13/2016   IR VERTEBROPLASTY LUMBAR BX INC UNI/BIL INC/INJECT/IMAGING 06/13/2016 Luanne Bras, MD [facility]  . IRRIGATION AND DEBRIDEMENT HEMATOMA Left 07/05/2015   Procedure: IRRIGATION AND DEBRIDEMENT HEMATOMA;  Surgeon: Robert Bellow, MD;  Location: [facility] OR[facility];  Service: General;  Laterality: Left;  . IRRIGATION AND DEBRIDEMENT HEMATOMA Left 11/11/2018   Procedure: IRRIGATION AND DEBRIDEMENT LEFT LEG HEMATOMA;  Surgeon: Robert Bellow, MD;  Location: [facility] ORS;  Service: General;  Laterality: Left;  . pyloric stenosis  07/1937  . US ECHOCARDIOGRAPHY       Family History: Family History  Problem Relation Age of Onset  . Stroke Mother   . Renal Disease Father      Social History: Social History   Socioeconomic History  . Marital status: Widowed    Spouse name: Not on file  . Number of children: Not on file  . Years of education: Not on file  . Highest education level: Not on file  Occupational History  . Not on file  Social Needs  . Financial resource strain: Not on file  . Food insecurity    Worry: Not on file    Inability: Not on file  . Transportation needs    Medical: Not on file    Non-medical: Not on file  Tobacco Use  . Smoking status: Former Smoker    Packs/day: 1.00    Years: 27.00    Pack years: 27.00    Types: Cigarettes    Quit date: 07/01/1980    Years since quitting: 38.4  . Smokeless tobacco: Never Used  . Tobacco comment: "quit smoking ~ 1980  Substance and Sexual Activity  . Alcohol use: Not Currently    Alcohol/week: 0.0 standard drinks    Comment: 04/10/2016 "I'll have a drink on holidays/special occasions"  . Drug use: No  . Sexual activity: Never  Lifestyle  . Physical activity    Days per week: Not on file    Minutes per session: Not on file  . Stress: Not on file  Relationships  . Social Herbalist on phone: Not on file    Gets  together: Not on file    Attends religious service: Not on file    Active member of club or organization: Not on file    Attends meetings of clubs or organizations: Not on file    Relationship status: Not on file  . Intimate partner violence    Fear of current or ex partner: Not on file    Emotionally abused: Not on file    Physically abused: Not on file    Forced sexual activity: Not on file  Other Topics Concern  . Not on file  Social History Narrative  . Not on file     Review of Systems: Review of Systems  Constitutional: Positive for malaise/fatigue. Negative  for chills and fever.  HENT: Negative for hearing loss, nosebleeds and tinnitus.   Eyes: Negative for blurred vision and double vision.  Respiratory: Positive for shortness of breath. Negative for cough.   Cardiovascular: Positive for orthopnea, leg swelling and PND.  Gastrointestinal: Negative for heartburn, nausea and vomiting.  Genitourinary: Negative for dysuria and urgency.  Musculoskeletal: Positive for back pain. Negative for myalgias.  Skin: Negative for itching and rash.  Neurological: Negative for dizziness and focal weakness.  Endo/Heme/Allergies: Negative for polydipsia. Does not bruise/bleed easily.  Psychiatric/Behavioral: Negative for depression. The patient is not nervous/anxious.      Vital Signs: Blood pressure 99/62, pulse (!) 59, temperature 98.3 F (36.8 C), temperature source Oral, resp. rate 20, height _0  (1.499 m), weight 64.7 kg, SpO2 97 %.  Weight trends: Filed Weights   12/08/18 0308 12/09/18 0306 12/10/18 0255  Weight: 64 kg 64.6 kg 64.7 kg    Physical Exam: General: NAD,   Head: Normocephalic, atraumatic.  Eyes: Anicteric, EOMI  Nose: Mucous membranes moist, not inflammed, nonerythematous.  Throat: Oropharynx nonerythematous, no exudate appreciated.   Neck: Supple, trachea midline.  Lungs:  Normal respiratory effort. Clear to auscultation BL without crackles or wheezes.   Heart: RRR. S1 and S2 normal without gallop, murmur, or rubs.  Abdomen:  BS normoactive. Soft, Nondistended, non-tender.  No masses or organomegaly.  Extremities: No pretibial edema.  Neurologic: A&O X3, Motor strength is 5/5 in the all 4 extremities  Skin: No visible rashes, scars.    Lab results: Basic Metabolic Panel: Recent Labs  Lab 12/04/18 1954  12/08/18 0904 12/09/18 0422 12/10/18 0449  NA 139   < > 140 141 142  K 4.0   < > 3.5 3.7 3.6  CL 99   < > 99 100 101  CO2 30   < > 31 34* 33*  GLUCOSE 98   < > 113* 93 92  BUN 24*   < > 21 22 24*  CREATININE 1.23*   < > 1.14* 1.35* 1.43*  CALCIUM 8.9   < > 9.0 8.9 8.9  MG 2.2  --   --   --   --    < > = values in this interval not displayed.    Liver Function Tests: Recent Labs  Lab 12/04/18 1627 12/04/18 1954  AST 21 20  ALT 15 15  ALKPHOS 92 89  BILITOT 1.0 0.9  PROT 6.6 6.2*  ALBUMIN 3.7 3.5   No results for input(s): LIPASE, AMYLASE in the last 168 hours. No results for input(s): AMMONIA in the last 168 hours.  CBC: Recent Labs  Lab 12/04/18 1954 12/06/18 0442 12/07/18 0453 12/09/18 0422 12/10/18 0449  WBC 5.5 4.1 6.3 4.5 4.3  NEUTROABS 4.2  --  4.7 3.3  --   HGB 9.2* 8.0* 8.8* 8.0* 8.3*  HCT 30.6* 27.6* 30.1* 27.0* 28.0*  MCV 89.0 89.0 88.5 88.5 87.2  PLT 206 171 199 159 176    Cardiac Enzymes: Recent Labs  Lab 12/04/18 1954 12/05/18 0258 12/05/18 0902  TROPONINI 0.05* 0.04* 0.04*    BNP: Invalid input(s): POCBNP  CBG: No results for input(s): GLUCAP in the last 168 hours.  Microbiology: Results for orders placed or performed during the hospital encounter of 12/04/18  MRSA PCR Screening     Status: None   Collection Time: 12/04/18  9:53 PM   Specimen: Nasopharyngeal  Result Value Ref Range Status   MRSA by PCR NEGATIVE NEGATIVE Final  Comment:        The GeneXpert MRSA Assay (FDA approved for NASAL specimens only), is one component of a comprehensive MRSA  colonization surveillance program. It is not intended to diagnose MRSA infection nor to guide or monitor treatment for MRSA infections. Performed at [facility], [address] [place], [place] [address]   SARS Coronavirus 2 (CEPHEID - Performed in [facility] hospital lab), Hosp Order     Status: None   Collection Time: 12/04/18  9:53 PM   Specimen: Nasopharyngeal  Result Value Ref Range Status   SARS Coronavirus 2 NEGATIVE NEGATIVE Final    Comment: (NOTE) If result is NEGATIVE SARS-CoV-2 target nucleic acids are NOT DETECTED. The SARS-CoV-2 RNA is generally detectable in upper and lower  respiratory specimens during the acute phase of infection. The lowest  concentration of SARS-CoV-2 viral copies this assay can detect is 250  copies / mL. A negative result does not preclude SARS-CoV-2 infection  and should not be used as the sole basis for treatment or other  patient management decisions.  A negative result may occur with  improper specimen collection / handling, submission of specimen other  than nasopharyngeal swab, presence of viral mutation(s) within the  areas targeted by this assay, and inadequate number of viral copies  (<250 copies / mL). A negative result must be combined with clinical  observations, patient history, and epidemiological information. If result is POSITIVE SARS-CoV-2 target nucleic acids are DETECTED. The SARS-CoV-2 RNA is generally detectable in upper and lower  respiratory specimens dur ing the acute phase of infection.  Positive  results are indicative of active infection with SARS-CoV-2.  Clinical  correlation with patient history and other diagnostic information is  necessary to determine patient infection status.  Positive results do  not rule out bacterial infection or co-infection with other viruses. If result is PRESUMPTIVE POSTIVE SARS-CoV-2 nucleic acids MAY BE PRESENT.   A presumptive positive result was obtained on the  submitted specimen  and confirmed on repeat testing.  While 2019 novel coronavirus  (SARS-CoV-2) nucleic acids may be present in the submitted sample  additional confirmatory testing may be necessary for epidemiological  and / or clinical management purposes  to differentiate between  SARS-CoV-2 and other Sarbecovirus currently known to infect humans.  If clinically indicated additional testing with an alternate test  methodology [id number]) is advised. The SARS-CoV-2 RNA is generally  detectable in upper and lower respiratory sp ecimens during the acute  phase of infection. The expected result is Negative. Fact Sheet for Patients:  StrictlyIdeas.no Fact Sheet for Healthcare Providers: BankingDealers.co.za This test is not yet approved or cleared by the [place] FDA and has been authorized for detection and/or diagnosis of SARS-CoV-2 by FDA under an Emergency Use Authorization (EUA).  This EUA will remain in effect (meaning this test can be used) for the duration of the COVID-19 declaration under Section 564(b)(1) of the Act, 21 U.S.C. section 360bbb-3(b)(1), unless the authorization is terminated or revoked sooner. Performed at [facility], [address], [place], [place] [address]   SARS Coronavirus 2 (CEPHEID - Performed in [facility] hospital lab), Hosp Order     Status: None   Collection Time: 12/10/18 11:29 AM   Specimen: Nasopharyngeal Swab  Result Value Ref Range Status   SARS Coronavirus 2 NEGATIVE NEGATIVE Final    Comment: (NOTE) If result is NEGATIVE SARS-CoV-2 target nucleic acids are NOT DETECTED. The SARS-CoV-2 RNA is generally detectable in upper and  lower  respiratory specimens during the acute phase of infection. The lowest  concentration of SARS-CoV-2 viral copies this assay can detect is 250  copies / mL. A negative result does not preclude SARS-CoV-2 infection  and should not be used as the sole  basis for treatment or other  patient management decisions.  A negative result may occur with  improper specimen collection / handling, submission of specimen other  than nasopharyngeal swab, presence of viral mutation(s) within the  areas targeted by this assay, and inadequate number of viral copies  (<250 copies / mL). A negative result must be combined with clinical  observations, patient history, and epidemiological information. If result is POSITIVE SARS-CoV-2 target nucleic acids are DETECTED. The SARS-CoV-2 RNA is generally detectable in upper and lower  respiratory specimens dur ing the acute phase of infection.  Positive  results are indicative of active infection with SARS-CoV-2.  Clinical  correlation with patient history and other diagnostic information is  necessary to determine patient infection status.  Positive results do  not rule out bacterial infection or co-infection with other viruses. If result is PRESUMPTIVE POSTIVE SARS-CoV-2 nucleic acids MAY BE PRESENT.   A presumptive positive result was obtained on the submitted specimen  and confirmed on repeat testing.  While 2019 novel coronavirus  (SARS-CoV-2) nucleic acids may be present in the submitted sample  additional confirmatory testing may be necessary for epidemiological  and / or clinical management purposes  to differentiate between  SARS-CoV-2 and other Sarbecovirus currently known to infect humans.  If clinically indicated additional testing with an alternate test  methodology [phone]) is advised. The SARS-CoV-2 RNA is generally  detectable in upper and lower respiratory sp ecimens during the acute  phase of infection. The expected result is Negative. Fact Sheet for Patients:  StrictlyIdeas.no Fact Sheet for Healthcare Providers: BankingDealers.co.za This test is not yet approved or cleared by the [place] FDA and has been authorized for detection  and/or diagnosis of SARS-CoV-2 by FDA under an Emergency Use Authorization (EUA).  This EUA will remain in effect (meaning this test can be used) for the duration of the COVID-19 declaration under Section 564(b)(1) of the Act, 21 U.S.C. section 360bbb-3(b)(1), unless the authorization is terminated or revoked sooner. Performed at [facility], [address] [place], [place] [address]     Coagulation Studies: Recent Labs    12/08/18 0904 12/09/18 0422 12/10/18 0449  LABPROT 26.3* 27.4* 26.7*  INR 2.5* 2.6* 2.5*    Urinalysis: No results for input(s): COLORURINE, LABSPEC, PHURINE, GLUCOSEU, HGBUR, BILIRUBINUR, KETONESUR, PROTEINUR, UROBILINOGEN, NITRITE, LEUKOCYTESUR in the last 72 hours.  Invalid input(s): APPERANCEUR    Imaging:  No results found.   Assessment & Plan: Pt is a 84 y.o. female with a PMHx of osteoarthritis, chronic back pain, chronic systolic heart failure ejection fraction 25%, chronic kidney disease stage III, GERD, hypertension, lymphedema, paroxysmal atrial fibrillation, mitral valve replacement, who was admitted to [facility] on 12/04/2018 for evaluation of increasing shortness of breath.   1.  Acute renal failure/chronic kidney disease stage III baseline creatinine 1.15.  Suspect that acute renal failure now related to acute cardiorenal syndrome.  Patient has required diuresis for decompensated heart failure.  Creatinine currently 1.4.  Okay to continue furosemide at this time.  No acute indication for dialysis.  Avoid nephrotoxins as possible.  2.  Acute on chronic systolic heart failure.  Continue diuresis as above.  We will need to monitor renal parameters closely while performing diuresis.  3.  Thanks for consultation.  Further plan as patient progresses.

## 2018-12-11 LAB — CBC
HCT: 29.6 % — ABNORMAL LOW (ref 36.0–46.0)
Hemoglobin: 8.5 g/dL — ABNORMAL LOW (ref 12.0–15.0)
MCH: 25.7 pg — ABNORMAL LOW (ref 26.0–34.0)
MCHC: 28.7 g/dL — ABNORMAL LOW (ref 30.0–36.0)
MCV: 89.4 fL (ref 80.0–100.0)
Platelets: 187 10*3/uL (ref 150–400)
RBC: 3.31 MIL/uL — ABNORMAL LOW (ref 3.87–5.11)
RDW: 16.7 % — ABNORMAL HIGH (ref 11.5–15.5)
WBC: 4.7 10*3/uL (ref 4.0–10.5)
nRBC: 0 % (ref 0.0–0.2)

## 2018-12-11 LAB — BASIC METABOLIC PANEL
Anion gap: 4 — ABNORMAL LOW (ref 5–15)
BUN: 23 mg/dL (ref 8–23)
CO2: 33 mmol/L — ABNORMAL HIGH (ref 22–32)
Calcium: 8.5 mg/dL — ABNORMAL LOW (ref 8.9–10.3)
Chloride: 103 mmol/L (ref 98–111)
Creatinine, Ser: 1.33 mg/dL — ABNORMAL HIGH (ref 0.44–1.00)
GFR calc Af Amer: 43 mL/min — ABNORMAL LOW (ref >60)
GFR calc non Af Amer: 37 mL/min — ABNORMAL LOW (ref >60)
Glucose, Bld: 96 mg/dL (ref 70–99)
Potassium: 3.6 mmol/L (ref 3.5–5.1)
Sodium: 140 mmol/L (ref 135–145)

## 2018-12-11 LAB — PROTIME-INR
INR: 2.6 — ABNORMAL HIGH (ref 0.8–1.2)
Prothrombin Time: 27.6 seconds — ABNORMAL HIGH (ref 11.4–15.2)

## 2018-12-11 MED ORDER — WARFARIN SODIUM 4 MG PO TABS
4.0000 mg | ORAL_TABLET | Freq: Once | ORAL | Status: AC
  Administered 2018-12-11: 19:00:00 4 mg via ORAL
  Filled 2018-12-11: qty 1

## 2018-12-11 NOTE — Progress Notes (Signed)
Talked to Dr. Brett Albino about patient's lasix dose this afternoon, since they were concern about her BP being soft and kidney function, BP now is 114/59, per MD ok to give. RN will continue to monitor.

## 2018-12-11 NOTE — Progress Notes (Signed)
[facility]  ROUNDING NOTE   Subjective:  Creatinine currently 1.3 with a BUN of 23. Patient resting comfortably at the moment.   Objective:  Vital signs in last 24 hours:  Temp:  [97.7 F (36.5 C)-98.3 F (36.8 C)] 97.7 F (36.5 C) (06/12 0835) Pulse Rate:  [59-63] 60 (06/12 0835) Resp:  [20] 20 (06/11 1717) BP: (99-118)/(53-62) 118/58 (06/12 0835) SpO2:  [85 %-98 %] 97 % (06/12 0835) Weight:  [64.4 kg] 64.4 kg (06/12 0442)  Weight change: -0.318 kg Filed Weights   12/09/18 0306 12/10/18 0255 12/11/18 0442  Weight: 64.6 kg 64.7 kg 64.4 kg    Intake/Output: I/O last 3 completed shifts: In: 243 [P.O.:240; I.V.:3] Out: 850 [Urine:850]   Intake/Output this shift:  Total I/O In: 3 [I.V.:3] Out: 100 [Urine:100]  Physical Exam: General: No acute distress  Head: Normocephalic, atraumatic. Moist oral mucosal membranes  Eyes: Anicteric  Neck: Supple, trachea midline  Lungs:  Clear to auscultation, normal effort  Heart: S1S2 no rubs  Abdomen:  Soft, nontender, bowel sounds present  Extremities: Trace peripheral edema.  Neurologic: Awake, alert, following commands  Skin: No lesions       Basic Metabolic Panel: Recent Labs  Lab 12/04/18 1954  12/07/18 0453 12/08/18 0904 12/09/18 0422 12/10/18 0449 12/11/18 0501  NA 139   < > 139 140 141 142 140  K 4.0   < > 4.0 3.5 3.7 3.6 3.6  CL 99   < > 99 99 100 101 103  CO2 30   < > 34* 31 34* 33* 33*  GLUCOSE 98   < > 101* 113* 93 92 96  BUN 24*   < > 21 21 22  24* 23  CREATININE 1.23*   < > 1.22* 1.14* 1.35* 1.43* 1.33*  CALCIUM 8.9   < > 8.8* 9.0 8.9 8.9 8.5*  MG 2.2  --   --   --   --   --   --    < > = values in this interval not displayed.    Liver Function Tests: Recent Labs  Lab 12/04/18 1627 12/04/18 1954  AST 21 20  ALT 15 15  ALKPHOS 92 89  BILITOT 1.0 0.9  PROT 6.6 6.2*  ALBUMIN 3.7 3.5   No results for input(s): LIPASE, AMYLASE in the last 168 hours. No results for input(s):  AMMONIA in the last 168 hours.  CBC: Recent Labs  Lab 12/04/18 1627 12/04/18 1954 12/06/18 0442 12/07/18 0453 12/09/18 0422 12/10/18 0449 12/11/18 0501  WBC 6.8 5.5 4.1 6.3 4.5 4.3 4.7  NEUTROABS 5.2 4.2  --  4.7 3.3  --   --   HGB 9.2* 9.2* 8.0* 8.8* 8.0* 8.3* 8.5*  HCT 31.2* 30.6* 27.6* 30.1* 27.0* 28.0* 29.6*  MCV 89.9 89.0 89.0 88.5 88.5 87.2 89.4  PLT 226 206 171 199 159 176 187    Cardiac Enzymes: Recent Labs  Lab 12/04/18 1954 12/05/18 0258 12/05/18 0902  TROPONINI 0.05* 0.04* 0.04*    BNP: Invalid input(s): POCBNP  CBG: No results for input(s): GLUCAP in the last 168 hours.  Microbiology: Results for orders placed or performed during the hospital encounter of 12/04/18  MRSA PCR Screening     Status: None   Collection Time: 12/04/18  9:53 PM   Specimen: Nasopharyngeal  Result Value Ref Range Status   MRSA by PCR NEGATIVE NEGATIVE Final    Comment:        The GeneXpert MRSA Assay (FDA approved for  NASAL specimens only), is one component of a comprehensive MRSA colonization surveillance program. It is not intended to diagnose MRSA infection nor to guide or monitor treatment for MRSA infections. Performed at [facility], [address], [place], [place] [address]   SARS Coronavirus 2 (CEPHEID - Performed in [facility] hospital lab), Hosp Order     Status: None   Collection Time: 12/04/18  9:53 PM   Specimen: Nasopharyngeal  Result Value Ref Range Status   SARS Coronavirus 2 NEGATIVE NEGATIVE Final    Comment: (NOTE) If result is NEGATIVE SARS-CoV-2 target nucleic acids are NOT DETECTED. The SARS-CoV-2 RNA is generally detectable in upper and lower  respiratory specimens during the acute phase of infection. The lowest  concentration of SARS-CoV-2 viral copies this assay can detect is 250  copies / mL. A negative result does not preclude SARS-CoV-2 infection  and should not be used as the sole basis for treatment or other  patient  management decisions.  A negative result may occur with  improper specimen collection / handling, submission of specimen other  than nasopharyngeal swab, presence of viral mutation(s) within the  areas targeted by this assay, and inadequate number of viral copies  (<250 copies / mL). A negative result must be combined with clinical  observations, patient history, and epidemiological information. If result is POSITIVE SARS-CoV-2 target nucleic acids are DETECTED. The SARS-CoV-2 RNA is generally detectable in upper and lower  respiratory specimens dur ing the acute phase of infection.  Positive  results are indicative of active infection with SARS-CoV-2.  Clinical  correlation with patient history and other diagnostic information is  necessary to determine patient infection status.  Positive results do  not rule out bacterial infection or co-infection with other viruses. If result is PRESUMPTIVE POSTIVE SARS-CoV-2 nucleic acids MAY BE PRESENT.   A presumptive positive result was obtained on the submitted specimen  and confirmed on repeat testing.  While 2019 novel coronavirus  (SARS-CoV-2) nucleic acids may be present in the submitted sample  additional confirmatory testing may be necessary for epidemiological  and / or clinical management purposes  to differentiate between  SARS-CoV-2 and other Sarbecovirus currently known to infect humans.  If clinically indicated additional testing with an alternate test  methodology [phone]) is advised. The SARS-CoV-2 RNA is generally  detectable in upper and lower respiratory sp ecimens during the acute  phase of infection. The expected result is Negative. Fact Sheet for Patients:  StrictlyIdeas.no Fact Sheet for Healthcare Providers: BankingDealers.co.za This test is not yet approved or cleared by the [place] FDA and has been authorized for detection and/or diagnosis of SARS-CoV-2 by FDA under  an Emergency Use Authorization (EUA).  This EUA will remain in effect (meaning this test can be used) for the duration of the COVID-19 declaration under Section 564(b)(1) of the Act, 21 U.S.C. section 360bbb-3(b)(1), unless the authorization is terminated or revoked sooner. Performed at [facility], [address] [place], [place] [address]   SARS Coronavirus 2 (CEPHEID - Performed in [facility] hospital lab), Hosp Order     Status: None   Collection Time: 12/10/18 11:29 AM   Specimen: Nasopharyngeal Swab  Result Value Ref Range Status   SARS Coronavirus 2 NEGATIVE NEGATIVE Final    Comment: (NOTE) If result is NEGATIVE SARS-CoV-2 target nucleic acids are NOT DETECTED. The SARS-CoV-2 RNA is generally detectable in upper and lower  respiratory specimens during the acute phase of infection. The lowest  concentration of  SARS-CoV-2 viral copies this assay can detect is 250  copies / mL. A negative result does not preclude SARS-CoV-2 infection  and should not be used as the sole basis for treatment or other  patient management decisions.  A negative result may occur with  improper specimen collection / handling, submission of specimen other  than nasopharyngeal swab, presence of viral mutation(s) within the  areas targeted by this assay, and inadequate number of viral copies  (<250 copies / mL). A negative result must be combined with clinical  observations, patient history, and epidemiological information. If result is POSITIVE SARS-CoV-2 target nucleic acids are DETECTED. The SARS-CoV-2 RNA is generally detectable in upper and lower  respiratory specimens dur ing the acute phase of infection.  Positive  results are indicative of active infection with SARS-CoV-2.  Clinical  correlation with patient history and other diagnostic information is  necessary to determine patient infection status.  Positive results do  not rule out bacterial infection or co-infection with other  viruses. If result is PRESUMPTIVE POSTIVE SARS-CoV-2 nucleic acids MAY BE PRESENT.   A presumptive positive result was obtained on the submitted specimen  and confirmed on repeat testing.  While 2019 novel coronavirus  (SARS-CoV-2) nucleic acids may be present in the submitted sample  additional confirmatory testing may be necessary for epidemiological  and / or clinical management purposes  to differentiate between  SARS-CoV-2 and other Sarbecovirus currently known to infect humans.  If clinically indicated additional testing with an alternate test  methodology [phone]) is advised. The SARS-CoV-2 RNA is generally  detectable in upper and lower respiratory sp ecimens during the acute  phase of infection. The expected result is Negative. Fact Sheet for Patients:  StrictlyIdeas.no Fact Sheet for Healthcare Providers: BankingDealers.co.za This test is not yet approved or cleared by the [place] FDA and has been authorized for detection and/or diagnosis of SARS-CoV-2 by FDA under an Emergency Use Authorization (EUA).  This EUA will remain in effect (meaning this test can be used) for the duration of the COVID-19 declaration under Section 564(b)(1) of the Act, 21 U.S.C. section 360bbb-3(b)(1), unless the authorization is terminated or revoked sooner. Performed at [facility], [address] [place], [place] [address]     Coagulation Studies: Recent Labs    12/09/18 0422 12/10/18 0449 12/11/18 0501  LABPROT 27.4* 26.7* 27.6*  INR 2.6* 2.5* 2.6*    Urinalysis: No results for input(s): COLORURINE, LABSPEC, PHURINE, GLUCOSEU, HGBUR, BILIRUBINUR, KETONESUR, PROTEINUR, UROBILINOGEN, NITRITE, LEUKOCYTESUR in the last 72 hours.  Invalid input(s): APPERANCEUR    Imaging: No results found.   Medications:   . sodium chloride     . amiodarone  200 mg Oral Daily  . carvedilol  3.125 mg Oral BID WC  . cholecalciferol   2,000 Units Oral Daily  . feeding supplement (ENSURE ENLIVE)  237 mL Oral BID BM  . furosemide  40 mg Oral BID  . losartan  12.5 mg Oral Daily  . polyethylene glycol  17 g Oral Daily  . potassium chloride SA  20 mEq Oral Daily  . sodium chloride flush  3 mL Intravenous Q12H  . vitamin C  500 mg Oral Daily  . warfarin  4 mg Oral ONCE-1800  . Warfarin - Pharmacist Dosing Inpatient   Does not apply q1800  . zinc sulfate  220 mg Oral Daily   sodium chloride, acetaminophen, albuterol, ALPRAZolam, diphenhydrAMINE, hydrOXYzine, ipratropium-albuterol, ondansetron (ZOFRAN) IV, oxyCODONE, sodium chloride flush  Assessment/ Plan:  83  y.o. female with a PMHx of osteoarthritis, chronic back pain, chronic systolic heart failure ejection fraction 25%, chronic kidney disease stage III, GERD, hypertension, lymphedema, paroxysmal atrial fibrillation, mitral valve replacement, who was admitted to [facility] on 12/04/2018 for evaluation of increasing shortness of breath.   1.  Acute renal failure/chronic kidney disease stage III baseline creatinine 1.15.  Creatinine currently 1.3 and relatively close to baseline.  Her edema has significantly improved.  I have advised nursing to hold diuretics today and reinitiate tomorrow.  Continue to monitor renal parameters closely.  2.  Acute on chronic systolic heart failure.   Patient appears better compensated at the moment.  Edema reduced.  Holding furosemide just for today.   LOS: 7 [name] 6/12/20202:16 PM

## 2018-12-11 NOTE — Consult Note (Signed)
[place] for warfarin dose management Indication: atrial fibrillation  Patient Measurements: Height: 4\' 11"  (149.9 cm) Weight: 142 lb (64.4 kg) IBW/kg (Calculated) : 43.2  Vital Signs: Temp: 97.7 F (36.5 C) (06/12 0442) Temp Source: Oral (06/12 0442) BP: 107/60 (06/12 0442) Pulse Rate: 63 (06/12 0442)  Labs: Recent Labs    12/09/18 0422 12/10/18 0449 12/11/18 0501  HGB 8.0* 8.3* 8.5*  HCT 27.0* 28.0* 29.6*  PLT 159 176 187  LABPROT 27.4* 26.7* 27.6*  INR 2.6* 2.5* 2.6*  CREATININE 1.35* 1.43* 1.33*    Estimated Creatinine Clearance: 27.1 mL/min (A) (by C-G formula based on SCr of 1.33 mg/dL (H)).   Medical History: Past Medical History:  Diagnosis Date  . Arthritis    "back, hands, knees" (04/10/2016)  . Chronic back pain   . 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 EF 25% by echo in 08/2015 b. RHC in 08/2015 showed normal filling pressures; c. 10/2018 Echo (in setting of atrial tachycardia): EF 25-30%, impaired relaxation. Glob HK. RVSP 8mmHg. Mod dil RA/LA. Mech MV w/ nl fxn (mean grad 77mmHg). Mod TR.   Marland Kitchen Chronic kidney disease   . Compression fracture    "several; all in my back" (04/10/2016)  . GERD (gastroesophageal reflux disease)   . Hypertension   . Lymphedema   . Pacemaker 05/2017  . PAF (paroxysmal atrial fibrillation) ([place])    a. On amio & Coumadin - CHA2DS2VASc = 4.  . S/P MVR (mitral valve replacement)    a. MVR 1994 b. redo MVR in 08/2014 - on Coumadin  . Shortness of breath dyspnea     Medications:  Scheduled:  . amiodarone  200 mg Oral Daily  . carvedilol  3.125 mg Oral BID WC  . cholecalciferol  2,000 Units Oral Daily  . feeding supplement (ENSURE ENLIVE)  237 mL Oral BID BM  . furosemide  40 mg Oral BID  . losartan  12.5 mg Oral Daily  . polyethylene glycol  17 g Oral Daily  . potassium chloride SA  20 mEq Oral Daily  . sodium chloride flush  3  mL Intravenous Q12H  . vitamin C  500 mg Oral Daily  . Warfarin - Pharmacist Dosing Inpatient   Does not apply q1800  . zinc sulfate  220 mg Oral Daily    Assessment: Pt was on warfarin previously for atrial fibrillation with mechanical mitral valve. On admission patient's INR was 3.1 (up to 3.6 today). Pt is on amiodarone which can increase INR.   Home warfarin dose: At home patient takes 4 mg daily- she is a resident at [facility] in [place]. I called the facility to confirm the dose changed from prior admission.   Today's INR is therapeutic.    --DDI with amiodarone (home med)   6/5 INR 3.1 6/6 INR 3.6  HELD 6/7 INR 3.0 (3 mg given) 6/8 INR 2.6 (4 mg given) 6/9 INR 2.5 (4 mg given) 6/10 INR 2.6 (4 mg given) 6/11 INR 2.5 (4 mg given) 6/11 INR 2.6   Goal of Therapy:  INR 2-3 (per cardiology on previous visit d/t bleed during that admission, even with valve). Update: Discussed with Dr.  Saunders Revel INR goal. Will continue to keep goal of 2-3, but will favor an estimated range of 2.5-3.  Monitor platelets by anticoagulation protocol: Yes   Plan:  Will order warfarin 4 mg x 1  and repeat INR in AM.  Rowland Lathe, PharmD 12/11/2018,7:47 AM

## 2018-12-11 NOTE — Progress Notes (Addendum)
[facility] [place] NAME: [name]    MR#:  [id number]  DATE OF BIRTH:  Oct 31, 1934  SUBJECTIVE:   Patient continues to feel better every day.  Her breathing seems to be at baseline.  Her lower extremity edema has almost completely resolved.  REVIEW OF SYSTEMS:  Review of Systems  Constitutional: Positive for malaise/fatigue. Negative for chills, fever and weight loss.  HENT: Negative for congestion, hearing loss and sore throat.   Eyes: Negative for blurred vision and double vision.  Respiratory: Negative for cough, shortness of breath and wheezing.   Cardiovascular: Negative for chest pain, palpitations, orthopnea and leg swelling.  Gastrointestinal: Negative for abdominal pain, diarrhea, nausea and vomiting.  Genitourinary: Negative for dysuria and urgency.  Musculoskeletal: Negative for myalgias.  Skin: Negative for rash.  Neurological: Negative for dizziness, sensory change, speech change, focal weakness and headaches.  Psychiatric/Behavioral: Negative for depression.   DRUG ALLERGIES:   Allergies  Allergen Reactions  . Ace Inhibitors     Cough  . Doxycycline Nausea Only  . Hydrocodone Itching and Nausea Only  . Mercury     Other reaction(s): Unknown  . Silver Dermatitis    Severe itching  . Cephalexin Rash  . Clindamycin/Lincomycin Rash  . Nickel Rash  . Penicillins Rash    Has patient had a PCN reaction causing immediate rash, facial/tongue/throat swelling, SOB or lightheadedness with hypotension: YES Has patient had a PCN reaction causing severe rash involving mucus membranes or skin necrosis: NO Has patient had a PCN reaction that required hospitalization NO Has patient had a PCN reaction occurring within the last 10 years: NO If all of the above answers are "NO", then may proceed with Cephalosporin use.   VITALS:  Blood pressure (!) 118/58, pulse 60, temperature 97.7 F (36.5 C), temperature source Oral,  resp. rate 20, height 4\' 11"  (1.499 m), weight 64.4 kg, SpO2 97 %. PHYSICAL EXAMINATION:   GENERAL:  84 y.o.-year-old patient lying in the bed with no acute distress.  EYES: Pupils equal, round, reactive to light and accommodation. No scleral icterus. Extraocular muscles intact.  HEENT: Head atraumatic, normocephalic. Oropharynx and nasopharynx clear.  NECK:  Supple, no jugular venous distention. No thyroid enlargement, no tenderness.  LUNGS: + Diminished breath sounds in the lung bases bilaterally. No use of accessory muscles of respiration.  CARDIOVASCULAR: RRR, S1, S2 normal. +loud systolic murmur, no rubs, or gallops.  ABDOMEN: Soft, nontender, nondistended. Bowel sounds present. No organomegaly or mass.  MUSCULOSKELETAL: No LE edema.  Ace wrap to bilateral legs, no cyanosis, or clubbing.  NEUROLOGIC:Alert and oriented x 3. CN 2-12 intact. Sensation intact to light touch. Gait not tested. PSYCHIATRIC: The patient is alert and oriented x 3.  SKIN: dry ACE bandages over bilateral legs.  DATA REVIEWED:  LABORATORY PANEL:  Female CBC Recent Labs  Lab 12/11/18 0501  WBC 4.7  HGB 8.5*  HCT 29.6*  PLT 187   ------------------------------------------------------------------------------------------------------------------ Chemistries  Recent Labs  Lab 12/04/18 1954  12/11/18 0501  NA 139   < > 140  K 4.0   < > 3.6  CL 99   < > 103  CO2 30   < > 33*  GLUCOSE 98   < > 96  BUN 24*   < > 23  CREATININE 1.23*   < > 1.33*  CALCIUM 8.9   < > 8.5*  MG 2.2  --   --   AST 20  --   --  ALT 15  --   --   ALKPHOS 89  --   --   BILITOT 0.9  --   --    < > = values in this interval not displayed.   RADIOLOGY:  No results found. ASSESSMENT AND PLAN:   Acute on chronic combined systolic and diastolic CHF- resolved. -Continue low dose coreg -Will hold losartan today due to low BPs -Continue home lasix 40mg  po bid -Daily weights, strict I/O -Cardiology following  Paroxysmal atrial  fibrillation + Mechanical MVR- AV paced. INR at goal. -Continue coumadin and amiodarone  Chronic respiratory failure secondary to COPD-  no signs of exacerbation. Currently on home 3L O2. -Duonebs prn -Pulmonology consulted  Left lower extremity wound status post debridement of large hematoma -Dr. Bary Castilla following  Elevated troponin- likely demand ischemia. Troponins flat.   Hypertension- BPs have been soft -Continue low dose coreg -Holding losartan for now   CKD III - creatinine better than baseline -Continue to monitor and avoid nephrotoxins  DVT prophylaxis- coumadin  Plan for discharge on Tuesday 6/16 to SNF or possibly home with [facility].  All the records are reviewed and case discussed with Care Management/Social Worker. Management plans discussed with the patient, family and they are in agreement.  CODE STATUS: Full Code  TOTAL TIME TAKING CARE OF THIS PATIENT: 35 minutes.   More than 50% of the time was spent in counseling/coordination of care: YES  POSSIBLE D/C IN 4-5 DAYS, DEPENDING ON CLINICAL CONDITION.   on 12/11/2018 at 1:08 PM  Between 7am to 6pm - Pager - [phone]  After 6pm go to [email or web address] - Proofreader  [facility]  Office  [phone]  CC: Primary care physician; Rusty Aus, MD  Note: This dictation was prepared with Dragon dictation along with smaller phrase technology. Any transcriptional errors that result from this process are unintentional.

## 2018-12-11 NOTE — Progress Notes (Signed)
Pulmonary Medicine          Date: 12/11/2018,   MRN# [id number] [name] 06-26-1935     AdmissionWeight: 66.5 kg                 CurrentWeight: 64.4 kg     SUBJECTIVE    Patient is sitting up in bed, she is in good spirits reports clinical improvement.  Daughter [name] at bedside.  Discussed that despite current progress patient is with advanced age and significant comorbid conditions, may benefit from palliative care evaluation to help offer additional resources to maximize comfort and survival out of hospital.  Both daughter and patient are agreeable and appreciative.    She has been working with RT using MetaNEB, Acapella and incentive spirometer with improvement in ability to clear phlegm and less atelectasis.   Have discussed case with [facility] to set up [facility] therapy at [facility].    PAST MEDICAL HISTORY   Past Medical History:  Diagnosis Date   Arthritis    "back, hands, knees" (04/10/2016)   Chronic back pain    Chronic combined systolic (congestive) and diastolic (congestive) heart failure (HCC)    a. EF 25% by echo in 08/2015 b. RHC in 08/2015 showed normal filling pressures; c. 10/2018 Echo (in setting of atrial tachycardia): EF 25-30%, impaired relaxation. Glob HK. RVSP 34mmHg. Mod dil RA/LA. Mech MV w/ nl fxn (mean grad 46mmHg). Mod TR.    Chronic kidney disease    Compression fracture    "several; all in my back" (04/10/2016)   GERD (gastroesophageal reflux disease)    Hypertension    Lymphedema    Pacemaker 05/2017   PAF (paroxysmal atrial fibrillation) ([place])    a. On amio & Coumadin - CHA2DS2VASc = 4.   S/P MVR (mitral valve replacement)    a. MVR 1994 b. redo MVR in 08/2014 - on Coumadin   Shortness of breath dyspnea      SURGICAL HISTORY   Past Surgical History:  Procedure Laterality Date   BREAST LUMPECTOMY Right 2005?   benign lump excision   CARDIAC CATHETERIZATION     CARDIAC VALVE  REPLACEMENT  1994, 2016   "MVR; MVR"   CARDIOVERSION N/A 11/06/2018   Procedure: CARDIOVERSION;  Surgeon: Minna Merritts, MD;  Location: [facility];  Service: Cardiovascular;  Laterality: N/A;   CATARACT EXTRACTION W/PHACO Right 02/20/2015   Procedure: CATARACT EXTRACTION PHACO AND INTRAOCULAR LENS PLACEMENT ([place]);  Surgeon: Estill Cotta, MD;  Location: [facility];  Service: Ophthalmology;  Laterality: Right;  US01:31.2 AP 25.3% CDE40.13 Fluid lot # [id number]   DRESSING CHANGE UNDER ANESTHESIA Left 11/17/2018   Procedure: DRESSING CHANGE;  Surgeon: Robert Bellow, MD;  Location: [facility];  Service: General;  Laterality: Left;  no anesthesia   ELECTROPHYSIOLOGIC STUDY N/A 10/09/2015   Procedure: CARDIOVERSION;  Surgeon: Wellington Hampshire, MD;  Location: [facility];  Service: Cardiovascular;  Laterality: N/A;   INCISION AND DRAINAGE OF WOUND Left 11/13/2018   Procedure: LEFT LEG DRESSING CHANGE;  Surgeon: Robert Bellow, MD;  Location: [facility];  Service: General;  Laterality: Left;   INCISION AND DRAINAGE OF WOUND Left 11/19/2018   Procedure: LEFT LEG DRESSING CHANGE AND GRAFT APPLICATION;  Surgeon: Robert Bellow, MD;  Location: [facility];  Service: General;  Laterality: Left;   IR GENERIC HISTORICAL  04/01/2016   IR RADIOLOGIST EVAL & MGMT 04/01/2016 [facility]   IR GENERIC HISTORICAL  04/15/2016  IR VERTEBROPLASTY CERV/THOR BX INC UNI/BIL INC/INJECT/IMAGING 04/15/2016 Luanne Bras, MD [facility]   IR GENERIC HISTORICAL  04/15/2016   IR VERTEBROPLASTY EA ADDL (T&LS) BX INC UNI/BIL INC INJECT/IMAGING 04/15/2016 Luanne Bras, MD [facility]   IR GENERIC HISTORICAL  04/15/2016   IR VERTEBROPLASTY EA ADDL (T&LS) BX INC UNI/BIL INC INJECT/IMAGING 04/15/2016 Luanne Bras, MD [facility]   IR GENERIC HISTORICAL  05/20/2016   IR RADIOLOGIST EVAL & MGMT 05/20/2016 [facility]   IR GENERIC HISTORICAL  06/13/2016   IR VERTEBROPLASTY EA ADDL (T&LS) BX INC  UNI/BIL INC INJECT/IMAGING 06/13/2016 Luanne Bras, MD [facility]   IR GENERIC HISTORICAL  06/13/2016   IR SACROPLASTY BILATERAL 06/13/2016 Luanne Bras, MD [facility]   IR GENERIC HISTORICAL  06/13/2016   IR VERTEBROPLASTY LUMBAR BX INC UNI/BIL INC/INJECT/IMAGING 06/13/2016 Luanne Bras, MD [facility]   IRRIGATION AND DEBRIDEMENT HEMATOMA Left 07/05/2015   Procedure: IRRIGATION AND DEBRIDEMENT HEMATOMA;  Surgeon: Robert Bellow, MD;  Location: [facility];  Service: General;  Laterality: Left;   IRRIGATION AND DEBRIDEMENT HEMATOMA Left 11/11/2018   Procedure: IRRIGATION AND DEBRIDEMENT LEFT LEG HEMATOMA;  Surgeon: Robert Bellow, MD;  Location: [facility];  Service: General;  Laterality: Left;   pyloric stenosis  07/1937   US ECHOCARDIOGRAPHY       FAMILY HISTORY   Family History  Problem Relation Age of Onset   Stroke Mother    Renal Disease Father      SOCIAL HISTORY   Social History   Tobacco Use   Smoking status: Former Smoker    Packs/day: 1.00    Years: 27.00    Pack years: 27.00    Types: Cigarettes    Quit date: 07/01/1980    Years since quitting: 38.4   Smokeless tobacco: Never Used   Tobacco comment: "quit smoking ~ 1980  Substance Use Topics   Alcohol use: Not Currently    Alcohol/week: 0.0 standard drinks    Comment: 04/10/2016 "I'll have a drink on holidays/special occasions"   Drug use: No     MEDICATIONS    Home Medication:    Current Medication:  Current Facility-Administered Medications:    0.9 %  sodium chloride infusion, 250 mL, Intravenous, PRN, Seals, Angela H, NP   acetaminophen (TYLENOL) tablet 650 mg, 650 mg, Oral, Q4H PRN, Seals, Angela H, NP, 650 mg at 12/05/18 2127   albuterol (PROVENTIL) (2.5 MG/3ML) 0.083% nebulizer solution 2.5 mg, 2.5 mg, Nebulization, Q6H PRN, Seals, Angela H, NP, 2.5 mg at 12/11/18 0817   ALPRAZolam (XANAX) tablet 0.25 mg, 0.25 mg, Oral, QHS PRN, Seals, Angela H, NP    amiodarone (PACERONE) tablet 200 mg, 200 mg, Oral, Daily, Seals, Angela H, NP, 200 mg at 12/11/18 1017   carvedilol (COREG) tablet 3.125 mg, 3.125 mg, Oral, BID WC, Arida, Muhammad A, MD, 3.125 mg at 12/11/18 1016   cholecalciferol (VITAMIN D) tablet 2,000 Units, 2,000 Units, Oral, Daily, Seals, Angela H, NP, 2,000 Units at 12/11/18 1017   diphenhydrAMINE (BENADRYL) capsule 25 mg, 25 mg, Oral, Q6H PRN, Mayo, Pete Pelt, MD, 25 mg at 12/09/18 1136   feeding supplement (ENSURE ENLIVE) (ENSURE ENLIVE) liquid 237 mL, 237 mL, Oral, BID BM, Mayo, Pete Pelt, MD, 237 mL at 12/07/18 1435   furosemide (LASIX) tablet 40 mg, 40 mg, Oral, BID, Visser, Jacquelyn D, PA-C, 40 mg at 12/09/18 1729   hydrOXYzine (ATARAX/VISTARIL) tablet 25 mg, 25 mg, Oral, TID PRN, Sela Hua, MD, 25 mg at 12/10/18 0302  ipratropium-albuterol (DUONEB) 0.5-2.5 (3) MG/3ML nebulizer solution 3 mL, 3 mL, Nebulization, Q6H PRN, Seals, Angela H, NP, 3 mL at 12/10/18 1347   losartan (COZAAR) tablet 12.5 mg, 12.5 mg, Oral, Daily, Arida, Muhammad A, MD, 12.5 mg at 12/11/18 1017   ondansetron (ZOFRAN) injection 4 mg, 4 mg, Intravenous, Q6H PRN, Seals, [name] Dills, NP   oxyCODONE (Oxy IR/ROXICODONE) immediate release tablet 2.5 mg, 2.5 mg, Oral, Q6H PRN, Seals, Angela H, NP, 2.5 mg at 12/08/18 1754   polyethylene glycol (MIRALAX / GLYCOLAX) packet 17 g, 17 g, Oral, Daily, Seals, Angela H, NP, 17 g at 12/09/18 4650   potassium chloride SA (K-DUR) CR tablet 20 mEq, 20 mEq, Oral, Daily, Seals, Angela H, NP, 20 mEq at 12/11/18 1017   sodium chloride flush (NS) 0.9 % injection 3 mL, 3 mL, Intravenous, Q12H, Seals, Angela H, NP, 3 mL at 12/11/18 1019   sodium chloride flush (NS) 0.9 % injection 3 mL, 3 mL, Intravenous, PRN, Seals, [name] Dills, NP   vitamin C (ASCORBIC ACID) tablet 500 mg, 500 mg, Oral, Daily, Seals, Angela H, NP, 500 mg at 12/11/18 1017   warfarin (COUMADIN) tablet 4 mg, 4 mg, Oral, ONCE-1800, Duncan, Asajah R, RPH    Warfarin - Pharmacist Dosing Inpatient, , Does not apply, q1800, Mayo, Pete Pelt, MD   zinc sulfate capsule 220 mg, 220 mg, Oral, Daily, Seals, Angela H, NP, 220 mg at 12/11/18 1017    ALLERGIES   Ace inhibitors, Doxycycline, Hydrocodone, Mercury, Silver, Cephalexin, Clindamycin/lincomycin, Nickel, and Penicillins     REVIEW OF SYSTEMS    Review of Systems:  Gen:  Denies  fever, sweats, chills weigh loss  HEENT: Denies blurred vision, double vision, ear pain, eye pain, hearing loss, nose bleeds, sore throat Cardiac:  No dizziness, chest pain or heaviness, chest tightness,edema Resp:   Denies cough or sputum porduction, shortness of breath,wheezing, hemoptysis,  Gi: Denies swallowing difficulty, stomach pain, nausea or vomiting, diarrhea, constipation, bowel incontinence Gu:  Denies bladder incontinence, burning urine Ext:   Denies Joint pain, stiffness or swelling Skin: Denies  skin rash, easy bruising or bleeding or hives Endoc:  Denies polyuria, polydipsia , polyphagia or weight change Psych:   Denies depression, insomnia or hallucinations   Other:  All other systems negative   VS: BP (!) 118/58 (BP Location: Left Arm)    Pulse 60    Temp 97.7 F (36.5 C) (Oral)    Resp 20    Ht 4\' 11"  (1.499 m)    Wt 64.4 kg    SpO2 97%    BMI 28.68 kg/m      PHYSICAL EXAM    GENERAL:NAD, no fevers, chills, no weakness no fatigue HEAD: Normocephalic, atraumatic.  EYES: Pupils equal, round, reactive to light. Extraocular muscles intact. No scleral icterus.  MOUTH: Moist mucosal membrane. Dentition intact. No abscess noted.  EAR, NOSE, THROAT: Clear without exudates. No external lesions.  NECK: Supple. No thyromegaly. No nodules. No JVD.  PULMONARY: mild bibasilar crackles with decreased bs bilaterally.  CARDIOVASCULAR: S1 and S2. Regular rate and rhythm. No murmurs, rubs, or gallops. No edema. Pedal pulses 2+ bilaterally.  GASTROINTESTINAL: Soft, nontender, nondistended. No masses.  Positive bowel sounds. No hepatosplenomegaly.  MUSCULOSKELETAL: No swelling, clubbing, or edema. Range of motion full in all extremities.  NEUROLOGIC: Cranial nerves II through XII are intact. No gross focal neurological deficits. Sensation intact. Reflexes intact.  SKIN: No ulceration, lesions, rashes, or cyanosis. Skin warm and dry. Turgor intact.  PSYCHIATRIC:  Mood, affect within normal limits. The patient is awake, alert and oriented x 3. Insight, judgment intact.       IMAGING    Dg Chest 1 View  Result Date: 12/08/2018 CLINICAL DATA:  Shortness of breath EXAM: CHEST  1 VIEW COMPARISON:  12/07/2018 FINDINGS: Prior median sternotomy and valve replacement. Left pacer remains in place, unchanged. Cardiomegaly with vascular congestion. Diffuse interstitial and airspace opacities lung with small effusions again noted. No real change. IMPRESSION: No significant change since prior study. Electronically Signed   By: Rolm Baptise M.D.   On: 12/08/2018 08:50   Dg Chest 1 View  Result Date: 12/07/2018 CLINICAL DATA:  Shortness of breath EXAM: CHEST  1 VIEW COMPARISON:  12/06/2018 FINDINGS: Cardiac shadow is enlarged but stable. Pacing device and postsurgical changes are again seen. Small effusions are again seen and stable. Patchy infiltrates are again noted right greater than left but stable from the prior exam. No new focal abnormality is noted. IMPRESSION: Stable airspace opacities right greater than left with associated small effusions. Electronically Signed   By: Inez Catalina M.D.   On: 12/07/2018 07:30   Dg Chest 1 View  Result Date: 12/06/2018 CLINICAL DATA:  Shortness of breath.  Lower extremity edema. EXAM: CHEST  1 VIEW COMPARISON:  Chest radiograph 12/05/2018 FINDINGS: Multi lead pacer apparatus overlies the left hemithorax. Leads are stable in position. Stable cardiomegaly. Similar-appearing patchy areas of consolidation within the mid and lower lungs bilaterally. Bilateral small to moderate  pleural effusions. No pneumothorax. IMPRESSION: Cardiomegaly. Moderate bilateral pleural effusions with underlying consolidation favored to represent combination of atelectasis and edema. Superimposed infection not excluded. Electronically Signed   By: Lovey Newcomer M.D.   On: 12/06/2018 06:44   Dg Chest 2 View  Result Date: 12/06/2018 CLINICAL DATA:  84 year old female with history of hepatocellular carcinoma. Hypertension and shortness of breath. EXAM: CHEST - 2 VIEW COMPARISON:  Chest x-ray 11/13/2018. FINDINGS: Lung volumes are low. Moderate bilateral pleural effusions. Bibasilar areas of atelectasis and/or consolidation. Cephalization of the pulmonary vasculature with mild diffuse increased interstitial markings. Mild cardiomegaly. Upper mediastinal contours are within normal limits given the low lung volumes and patient rotation to the right. Aortic atherosclerosis. Status post median sternotomy. Left-sided biventricular pacemaker in position with lead tips projecting over the expected location of the right atrium, right ventricular apex and left ventricle (via the coronary sinus and coronary veins). Status post mitral valve replacement. Post vertebroplasty changes in midthoracic vertebral bodies again noted. IMPRESSION: 1. The appearance the chest suggests worsening congestive heart failure, as above. 2. Bibasilar opacities may reflect areas of atelectasis and/or consolidation. 3. Aortic atherosclerosis. Electronically Signed   By: Vinnie Langton M.D.   On: 12/06/2018 19:48   Dg Chest 2 View  Result Date: 11/13/2018 CLINICAL DATA:  84 year old female with shortness of breath. Traumatic bleeding recently, on Coumadin. EXAM: CHEST - 2 VIEW COMPARISON:  11/04/2018 and earlier. FINDINGS: Seated AP and lateral views of the chest. Stable cardiomegaly and mediastinal contours. Previous cardiac valve replacement. Stable left chest pacemaker. Regressed bilateral pulmonary interstitial opacity. Mild residual  patchy opacity now about the left hilum and in the right upper lung. On the lateral view there are small bilateral pleural effusions with some fluid tracking into the minor fissure. Osteopenia with progressed thoracic compression fractures since 2017. Two levels previously augmented. Negative visible bowel gas pattern. IMPRESSION: 1. Regressed pulmonary edema since 11/04/2018. Small bilateral pleural effusions. 2. Patchy nonspecific perihilar opacity might reflect residual edema. Acute  infection difficult to exclude. Electronically Signed   By: Genevie Ann M.D.   On: 11/13/2018 10:40   Dg Chest Port 1 View  Result Date: 12/05/2018 CLINICAL DATA:  Congestive heart failure. EXAM: PORTABLE CHEST 1 VIEW COMPARISON:  11/13/2018 FINDINGS: There is a new airspace opacity that appears to be centered within the right middle lobe. The cardiac size is enlarged. The lung volumes are low. There are additional hazy airspace opacities bilaterally with prominent interstitial lung markings. There is a multi lead left-sided pacemaker in place. The patient is status post prior mitral valve replacement. There are probable small bilateral pleural effusions. The patient appears to be status post multilevel vertebral augmentation. IMPRESSION: 1. Airspace opacity in the right middle lobe concerning for a developing infiltrate or atelectasis. 2. Cardiomegaly with volume overload and small bilateral pleural effusions. Electronically Signed   By: Constance Holster M.D.   On: 12/05/2018 03:24   Dg Foot Complete Left  Result Date: 11/24/2018 CLINICAL DATA:  Lateral left foot pain for 2-3 days with a wound and bruising. EXAM: LEFT FOOT - COMPLETE 3+ VIEW COMPARISON:  None. FINDINGS: The bones are diffusely osteopenic. There is minimal soft tissue prominence at the base of the fifth metatarsal. No acute fracture, dislocation, osseous erosion, soft tissue emphysema, radiopaque foreign body is identified. Extensive atherosclerotic vascular  calcifications are noted. IMPRESSION: No acute osseous abnormality identified. Electronically Signed   By: Logan Bores M.D.   On: 11/24/2018 17:25      ASSESSMENT/PLAN    Acute on chronic hypoxemic respiratory failure -Due to acute decompensated systolic CHF with EF 25 to 30% -Cardiology on case appreciate input-follow recommendations -Currently being diuresed40mg  IV twice daily of Lasix, will add IV albumin to increase efficacy -Complicated by CKD and significant chronic anemia- will ask nephro to evaluate patient as she is higher risk for progressing to ESRD -s/p ABG - acceptable numbers while diuresing. Mild lethargy not from hypercapnia likely due to IV diphenhydramine, discussed with patient will hold for now    Bilateral atelectasis -Contributing to hypoxemia - chest PT - MetaNeb BID - Incentive spirometer to be used each hour multiple times - please encourage to use  - patient has difficulty expectorating phlegm -continue Acapella     Moderate protein calorie malnutrition and deconditioning -Contributing to dyspnea -Patient working with physical therapy was able to get out of bed and slowly walk today but encouraged to continue this as well as outpatient -Nutritional assessment patient has been aspirating and has been evaluated with modified barium swallow recommended aspiration precautions-will place consultation for dietitian eval -has ensure at bedside      Thank you for allowing me to participate in the care of this patient.   Patient/Family are satisfied with care plan and all questions have been answered.  This document was prepared using Dragon voice recognition software and may include unintentional dictation errors.     Ottie Glazier, M.D.  Division of [place]

## 2018-12-11 NOTE — Progress Notes (Signed)
Occupational Therapy Treatment Patient Details Name: [name] MRN: [id number] DOB: Oct 16, 1934 Today's Date: 12/11/2018    History of present illness [name] is an 70yoF who comes to [facility] from cardiology after a/c exacerbation of sCHF. Pt has been at [facility] for <1W s/p DC from this site for a similar problem. PMH: AF, chronic back pain, combined CHF, CKD, compression fracture of spine, PPM, MR, pulmonary HTN, LEE, chronic O2 use 3L, recently 4-6L.    OT comments  [name] was seen for OT treatment on this date. Upon arrival to room pt was standing using RW for support with RN, MD, and dtr present. Pt stated she was ready to walk. RN provided assist with mgt of O2 tank (pt on 6L during ambulation) while OT provided CGA for safety. Attempted to monitor SPo2 with mobility, however, neither this therapist nor the RN were able to get a reading likely 2/2 pt gripping walker tightly with BUE during mobility. Pt ambulated ~100 ft on this date. Once pt seated back in room, O2 sats were in upper 80's (86-88). Quickly rebounded to 98-100 with rest and cues for PLB.  Pt and dtr provided with reinforcement of prior education on ECS and PLB during ADL/Mobility. Both verbalized understanding of education provided. Pt continues to benefit from skilled OT services to maximize return to PLOF and minimize risk of future falls, injury, caregiver burden, and readmission. Will continue to follow POC as written. Discharge recommendation remains appropriate.    Follow Up Recommendations  SNF    Equipment Recommendations  3 in 1 bedside commode    Recommendations for Other Services      Precautions / Restrictions Precautions Precautions: Fall Precaution Comments: Per Dr Bary Castilla, pt has no weight bearing precautions Restrictions Weight Bearing Restrictions: No Other Position/Activity Restrictions: Per Dr. Bary Castilla, pt has no weight bearing precautions       Mobility Bed  Mobility Overal bed mobility: Needs Assistance                Transfers Overall transfer level: Needs assistance Equipment used: Rolling walker (2 wheeled) Transfers: Sit to/from Stand Sit to Stand: Min guard              Balance Overall balance assessment: Needs assistance Sitting-balance support: No upper extremity supported;Feet supported Sitting balance-Leahy Scale: Good     Standing balance support: Bilateral upper extremity supported Standing balance-Leahy Scale: Fair Standing balance comment: requires RW for UE support                            ADL either performed or assessed with clinical judgement   ADL Overall ADL's : Needs assistance/impaired                                     Functional mobility during ADLs: Rolling walker;Min guard;Cueing for safety;+2 for safety/equipment General ADL Comments: Pt ambulated ~100 feet on this date with +2 for mgt of O2 and CGA for safety. Pt required consisent VCs for PLB technique during functional mobility. Spo2 sat at 86 after ambulation (unable to monitor during ambulation as monitor could not read while pt gripped walker) O2 sats returned to 98-100 with rest break and cues for PLB.     Vision Baseline Vision/History: Wears glasses Wears Glasses: At all times Patient Visual Report: No change from baseline  Perception     Praxis      Cognition   Behavior During Therapy: WFL for tasks assessed/performed Overall Cognitive Status: Within Functional Limits for tasks assessed                                 General Comments: eager to ambulate         Exercises Other Exercises Other Exercises: Pt and caregiver (daughter) educated on ECS including PLB during during functional mobility/ADLs. Pt and dtr verbalized understanding of information provided. Pt return demonstrated breathing techniques during functional mobility.   Shoulder Instructions       General  Comments BLE wraps in place.    Pertinent Vitals/ Pain       Pain Assessment: No/denies pain  Home Living                                          Prior Functioning/Environment              Frequency  Min 2X/week        Progress Toward Goals  OT Goals(current goals can now be found in the care plan section)  Progress towards OT goals: Progressing toward goals  Acute Rehab OT Goals Patient Stated Goal: to walk and get back to my old routines OT Goal Formulation: With patient Time For Goal Achievement: 12/21/18 Potential to Achieve Goals: Good  Plan Discharge plan remains appropriate;Frequency remains appropriate    Co-evaluation                 AM-PAC OT "6 Clicks" Daily Activity     Outcome Measure   Help from another person eating meals?: None Help from another person taking care of personal grooming?: A Little Help from another person toileting, which includes using toliet, bedpan, or urinal?: A Lot Help from another person bathing (including washing, rinsing, drying)?: A Lot Help from another person to put on and taking off regular upper body clothing?: A Little Help from another person to put on and taking off regular lower body clothing?: A Lot 6 Click Score: 16    End of Session Equipment Utilized During Treatment: Gait belt;Rolling walker  OT Visit Diagnosis: Other abnormalities of gait and mobility (R26.89);Muscle weakness (generalized) (M62.81);Pain Pain - Right/Left: Left Pain - part of body: Leg   Activity Tolerance Patient limited by fatigue;Patient tolerated treatment well   Patient Left in chair;with call bell/phone within reach;with family/visitor present   Nurse Communication          Time: 7017-7939 OT Time Calculation (min): 24 min  Charges: OT General Charges $OT Visit: 1 Visit OT Treatments $Self Care/Home Management : 23-37 mins  Shara Blazing, M.S., OTR/L Ascom: [phone] 12/11/18, 3:05  PM

## 2018-12-12 LAB — BASIC METABOLIC PANEL
Anion gap: 7 (ref 5–15)
BUN: 23 mg/dL (ref 8–23)
CO2: 33 mmol/L — ABNORMAL HIGH (ref 22–32)
Calcium: 9.2 mg/dL (ref 8.9–10.3)
Chloride: 103 mmol/L (ref 98–111)
Creatinine, Ser: 1.17 mg/dL — ABNORMAL HIGH (ref 0.44–1.00)
GFR calc Af Amer: 51 mL/min — ABNORMAL LOW (ref >60)
GFR calc non Af Amer: 44 mL/min — ABNORMAL LOW (ref >60)
Glucose, Bld: 94 mg/dL (ref 70–99)
Potassium: 3.8 mmol/L (ref 3.5–5.1)
Sodium: 143 mmol/L (ref 135–145)

## 2018-12-12 LAB — PROTIME-INR
INR: 3.1 — ABNORMAL HIGH (ref 0.8–1.2)
Prothrombin Time: 31.6 seconds — ABNORMAL HIGH (ref 11.4–15.2)

## 2018-12-12 MED ORDER — WARFARIN SODIUM 2 MG PO TABS
2.0000 mg | ORAL_TABLET | Freq: Once | ORAL | Status: AC
  Administered 2018-12-12: 2 mg via ORAL
  Filled 2018-12-12: qty 1

## 2018-12-12 NOTE — Progress Notes (Signed)
RN offered/encouraged pt to go for a walk/ pt declined at present time / states " I might in a little while"/ will continue to encourage pt

## 2018-12-12 NOTE — Progress Notes (Addendum)
Physical Therapy Treatment Patient Details Name: [name] MRN: [id number] DOB: [birth year] Today's Date: 12/12/2018    History of Present Illness [name] is an 14yoF who comes to [facility] from cardiology after a/c exacerbation of sCHF. Pt has been at [facility] for <1W s/p DC from this site for a similar problem. PMH: AF, chronic back pain, combined CHF, CKD, compression fracture of spine, PPM, MR, pulmonary HTN, LEE, chronic O2 use 3L, recently 4-6L.     PT Comments    Pt in bed, ready to walk.  To edge of bed with assist for covers.  Stood with min a x 1 and was able to ambulate x 1 lap around unit on 4 lpm.  Gait steady but fatigues with sats 84% upon sitting.  Pt asking to get to commode and was assisted with min a x 1.  Wanted time on commode.  Declined further interventions. Call bell in reach and tech notified.  Son in and listed as frustrations regarding his mothers decline in health over the past few weeks.  Encouragement given.   Follow Up Recommendations  SNF     Equipment Recommendations  None recommended by PT    Recommendations for Other Services       Precautions / Restrictions Precautions Precaution Comments: Per Dr Bary Castilla, pt has no weight bearing precautions Restrictions Weight Bearing Restrictions: No    Mobility  Bed Mobility   Bed Mobility: Supine to Sit;Sit to Supine     Supine to sit: Supervision        Transfers Overall transfer level: Needs assistance Equipment used: Rolling walker (2 wheeled);None Transfers: Sit to/from [employer] to Stand: Min assist Stand pivot transfers: Min assist       General transfer comment: Verbal cues for hand placements  Ambulation/Gait Ambulation/Gait assistance: Min guard;Min assist Gait Distance (Feet): 180 Feet Assistive device: Rolling walker (2 wheeled) Gait Pattern/deviations: Step-through pattern;Decreased step length - right;Decreased step length - left;Trunk  flexed Gait velocity: decreased   General Gait Details: sats to 84% on 4 lpm.   Stairs             Wheelchair Mobility    Modified Rankin (Stroke Patients Only)       Balance Overall balance assessment: Needs assistance Sitting-balance support: No upper extremity supported;Feet supported Sitting balance-Leahy Scale: Good     Standing balance support: Bilateral upper extremity supported Standing balance-Leahy Scale: Fair Standing balance comment: requires RW for UE support                             Cognition Arousal/Alertness: Awake/alert Behavior During Therapy: WFL for tasks assessed/performed Overall Cognitive Status: Within Functional Limits for tasks assessed                                        Exercises      General Comments        Pertinent Vitals/Pain Pain Assessment: No/denies pain    Home Living                      Prior Function            PT Goals (current goals can now be found in the care plan section) Progress towards PT goals: Progressing toward goals    Frequency  Min 2X/week      PT Plan Current plan remains appropriate    Co-evaluation              AM-PAC PT "6 Clicks" Mobility   Outcome Measure  Help needed turning from your back to your side while in a flat bed without using bedrails?: A Little Help needed moving from lying on your back to sitting on the side of a flat bed without using bedrails?: A Little Help needed moving to and from a bed to a chair (including a wheelchair)?: A Little Help needed standing up from a chair using your arms (e.g., wheelchair or bedside chair)?: A Little Help needed to walk in hospital room?: A Little Help needed climbing 3-5 steps with a railing? : A Little 6 Click Score: 18    End of Session Equipment Utilized During Treatment: Gait belt;Oxygen Activity Tolerance: Patient tolerated treatment well Patient left: Other  (comment) Nurse Communication: Other (comment) Pain - Right/Left: Left Pain - part of body: Leg     Time: 2951-8841 PT Time Calculation (min) (ACUTE ONLY): 10 min  Charges:  $Gait Training: 8-22 mins                     Chesley Noon, PTA 12/12/18, 12:27 PM

## 2018-12-12 NOTE — Progress Notes (Signed)
Left leg dressing changed per order/ tolerated well

## 2018-12-12 NOTE — Progress Notes (Signed)
pts son, Helene Kelp states that he will bring pts compression stocking from home tomorrow( 6/14)

## 2018-12-12 NOTE — Progress Notes (Signed)
Pt ambulated around nurses station with RN/ tolerated well

## 2018-12-12 NOTE — Progress Notes (Signed)
[facility]  ROUNDING NOTE   Subjective:  Patient appears to be in good spirits today. Creatinine down to 1.17. Overall appears to be getting better.  Objective:  Vital signs in last 24 hours:  Temp:  [97.4 F (36.3 C)-98 F (36.7 C)] 97.5 F (36.4 C) (06/13 0733) Pulse Rate:  [60-64] 64 (06/13 0733) Resp:  [18-20] 18 (06/13 0455) BP: (103-114)/(45-59) 107/51 (06/13 0733) SpO2:  [93 %-100 %] 98 % (06/13 0733)  Weight change:  Filed Weights   12/09/18 0306 12/10/18 0255 12/11/18 0442  Weight: 64.6 kg 64.7 kg 64.4 kg    Intake/Output: I/O last 3 completed shifts: In: 6 [I.V.:6] Out: 950 [Urine:950]   Intake/Output this shift:  Total I/O In: -  Out: 200 [Urine:200]  Physical Exam: General: No acute distress  Head: Normocephalic, atraumatic. Moist oral mucosal membranes  Eyes: Anicteric  Neck: Supple, trachea midline  Lungs:  Clear to auscultation, normal effort  Heart: S1S2 no rubs  Abdomen:  Soft, nontender, bowel sounds present  Extremities: Trace peripheral edema, LEs wrapped  Neurologic: Awake, alert, following commands  Skin: No lesions       Basic Metabolic Panel: Recent Labs  Lab 12/08/18 0904 12/09/18 0422 12/10/18 0449 12/11/18 0501 12/12/18 0506  NA 140 141 142 140 143  K 3.5 3.7 3.6 3.6 3.8  CL 99 100 101 103 103  CO2 31 34* 33* 33* 33*  GLUCOSE 113* 93 92 96 94  BUN 21 22 24* 23 23  CREATININE 1.14* 1.35* 1.43* 1.33* 1.17*  CALCIUM 9.0 8.9 8.9 8.5* 9.2    Liver Function Tests: No results for input(s): AST, ALT, ALKPHOS, BILITOT, PROT, ALBUMIN in the last 168 hours. No results for input(s): LIPASE, AMYLASE in the last 168 hours. No results for input(s): AMMONIA in the last 168 hours.  CBC: Recent Labs  Lab 12/06/18 0442 12/07/18 0453 12/09/18 0422 12/10/18 0449 12/11/18 0501  WBC 4.1 6.3 4.5 4.3 4.7  NEUTROABS  --  4.7 3.3  --   --   HGB 8.0* 8.8* 8.0* 8.3* 8.5*  HCT 27.6* 30.1* 27.0* 28.0* 29.6*  MCV 89.0 88.5  88.5 87.2 89.4  PLT 171 199 159 176 187    Cardiac Enzymes: No results for input(s): CKTOTAL, CKMB, CKMBINDEX, TROPONINI in the last 168 hours.  BNP: Invalid input(s): POCBNP  CBG: No results for input(s): GLUCAP in the last 168 hours.  Microbiology: Results for orders placed or performed during the hospital encounter of 12/04/18  MRSA PCR Screening     Status: None   Collection Time: 12/04/18  9:53 PM   Specimen: Nasopharyngeal  Result Value Ref Range Status   MRSA by PCR NEGATIVE NEGATIVE Final    Comment:        The GeneXpert MRSA Assay (FDA approved for NASAL specimens only), is one component of a comprehensive MRSA colonization surveillance program. It is not intended to diagnose MRSA infection nor to guide or monitor treatment for MRSA infections. Performed at [facility], [address] [place], [place] [address]   SARS Coronavirus 2 (CEPHEID - Performed in [facility] hospital lab), Hosp Order     Status: None   Collection Time: 12/04/18  9:53 PM   Specimen: Nasopharyngeal  Result Value Ref Range Status   SARS Coronavirus 2 NEGATIVE NEGATIVE Final    Comment: (NOTE) If result is NEGATIVE SARS-CoV-2 target nucleic acids are NOT DETECTED. The SARS-CoV-2 RNA is generally detectable in upper and lower  respiratory specimens during  the acute phase of infection. The lowest  concentration of SARS-CoV-2 viral copies this assay can detect is 250  copies / mL. A negative result does not preclude SARS-CoV-2 infection  and should not be used as the sole basis for treatment or other  patient management decisions.  A negative result may occur with  improper specimen collection / handling, submission of specimen other  than nasopharyngeal swab, presence of viral mutation(s) within the  areas targeted by this assay, and inadequate number of viral copies  (<250 copies / mL). A negative result must be combined with clinical  observations, patient history, and  epidemiological information. If result is POSITIVE SARS-CoV-2 target nucleic acids are DETECTED. The SARS-CoV-2 RNA is generally detectable in upper and lower  respiratory specimens dur ing the acute phase of infection.  Positive  results are indicative of active infection with SARS-CoV-2.  Clinical  correlation with patient history and other diagnostic information is  necessary to determine patient infection status.  Positive results do  not rule out bacterial infection or co-infection with other viruses. If result is PRESUMPTIVE POSTIVE SARS-CoV-2 nucleic acids MAY BE PRESENT.   A presumptive positive result was obtained on the submitted specimen  and confirmed on repeat testing.  While 2019 novel coronavirus  (SARS-CoV-2) nucleic acids may be present in the submitted sample  additional confirmatory testing may be necessary for epidemiological  and / or clinical management purposes  to differentiate between  SARS-CoV-2 and other Sarbecovirus currently known to infect humans.  If clinically indicated additional testing with an alternate test  methodology [phone]) is advised. The SARS-CoV-2 RNA is generally  detectable in upper and lower respiratory sp ecimens during the acute  phase of infection. The expected result is Negative. Fact Sheet for Patients:  StrictlyIdeas.no Fact Sheet for Healthcare Providers: BankingDealers.co.za This test is not yet approved or cleared by the [place] FDA and has been authorized for detection and/or diagnosis of SARS-CoV-2 by FDA under an Emergency Use Authorization (EUA).  This EUA will remain in effect (meaning this test can be used) for the duration of the COVID-19 declaration under Section 564(b)(1) of the Act, 21 U.S.C. section 360bbb-3(b)(1), unless the authorization is terminated or revoked sooner. Performed at [facility], [address] [place], [place] [address]   SARS  Coronavirus 2 (CEPHEID - Performed in [facility] hospital lab), Hosp Order     Status: None   Collection Time: 12/10/18 11:29 AM   Specimen: Nasopharyngeal Swab  Result Value Ref Range Status   SARS Coronavirus 2 NEGATIVE NEGATIVE Final    Comment: (NOTE) If result is NEGATIVE SARS-CoV-2 target nucleic acids are NOT DETECTED. The SARS-CoV-2 RNA is generally detectable in upper and lower  respiratory specimens during the acute phase of infection. The lowest  concentration of SARS-CoV-2 viral copies this assay can detect is 250  copies / mL. A negative result does not preclude SARS-CoV-2 infection  and should not be used as the sole basis for treatment or other  patient management decisions.  A negative result may occur with  improper specimen collection / handling, submission of specimen other  than nasopharyngeal swab, presence of viral mutation(s) within the  areas targeted by this assay, and inadequate number of viral copies  (<250 copies / mL). A negative result must be combined with clinical  observations, patient history, and epidemiological information. If result is POSITIVE SARS-CoV-2 target nucleic acids are DETECTED. The SARS-CoV-2 RNA is generally detectable in upper and lower  respiratory specimens dur ing the acute phase of infection.  Positive  results are indicative of active infection with SARS-CoV-2.  Clinical  correlation with patient history and other diagnostic information is  necessary to determine patient infection status.  Positive results do  not rule out bacterial infection or co-infection with other viruses. If result is PRESUMPTIVE POSTIVE SARS-CoV-2 nucleic acids MAY BE PRESENT.   A presumptive positive result was obtained on the submitted specimen  and confirmed on repeat testing.  While 2019 novel coronavirus  (SARS-CoV-2) nucleic acids may be present in the submitted sample  additional confirmatory testing may be necessary for epidemiological  and / or  clinical management purposes  to differentiate between  SARS-CoV-2 and other Sarbecovirus currently known to infect humans.  If clinically indicated additional testing with an alternate test  methodology [phone]) is advised. The SARS-CoV-2 RNA is generally  detectable in upper and lower respiratory sp ecimens during the acute  phase of infection. The expected result is Negative. Fact Sheet for Patients:  StrictlyIdeas.no Fact Sheet for Healthcare Providers: BankingDealers.co.za This test is not yet approved or cleared by the [place] FDA and has been authorized for detection and/or diagnosis of SARS-CoV-2 by FDA under an Emergency Use Authorization (EUA).  This EUA will remain in effect (meaning this test can be used) for the duration of the COVID-19 declaration under Section 564(b)(1) of the Act, 21 U.S.C. section 360bbb-3(b)(1), unless the authorization is terminated or revoked sooner. Performed at [facility], [address] [place], [place] [address]     Coagulation Studies: Recent Labs    12/10/18 0449 12/11/18 0501 12/12/18 0506  LABPROT 26.7* 27.6* 31.6*  INR 2.5* 2.6* 3.1*    Urinalysis: No results for input(s): COLORURINE, LABSPEC, PHURINE, GLUCOSEU, HGBUR, BILIRUBINUR, KETONESUR, PROTEINUR, UROBILINOGEN, NITRITE, LEUKOCYTESUR in the last 72 hours.  Invalid input(s): APPERANCEUR    Imaging: No results found.   Medications:   . sodium chloride     . amiodarone  200 mg Oral Daily  . carvedilol  3.125 mg Oral BID WC  . cholecalciferol  2,000 Units Oral Daily  . feeding supplement (ENSURE ENLIVE)  237 mL Oral BID BM  . furosemide  40 mg Oral BID  . losartan  12.5 mg Oral Daily  . polyethylene glycol  17 g Oral Daily  . potassium chloride SA  20 mEq Oral Daily  . sodium chloride flush  3 mL Intravenous Q12H  . vitamin C  500 mg Oral Daily  . warfarin  2 mg Oral ONCE-1800  . Warfarin -  Pharmacist Dosing Inpatient   Does not apply q1800  . zinc sulfate  220 mg Oral Daily   sodium chloride, acetaminophen, albuterol, hydrOXYzine, ipratropium-albuterol, ondansetron (ZOFRAN) IV, oxyCODONE, sodium chloride flush  Assessment/ Plan:  84 y.o. female with a PMHx of osteoarthritis, chronic back pain, chronic systolic heart failure ejection fraction 25%, chronic kidney disease stage III, GERD, hypertension, lymphedema, paroxysmal atrial fibrillation, mitral valve replacement, who was admitted to [facility] on 12/04/2018 for evaluation of increasing shortness of breath.   1.  Acute renal failure/chronic kidney disease stage III baseline creatinine 1.15.  Patient now appears to be at her baseline creatinine of 1.1.  Overall her volume status is significantly improved.  No further renal input at this time.  2.  Acute on chronic systolic heart failure.   Patient appears to be well compensated.  Continue furosemide 40 mg twice daily.  3.  Disposition as per hospitalist.   LOS:  8 Layman Gully 6/13/20203:32 PM

## 2018-12-12 NOTE — Progress Notes (Signed)
[facility] [place] NAME: [name]    MR#:  [id number]  DATE OF BIRTH:  1934/12/16  SUBJECTIVE:   Patient is feeling better.  Had a bowel movement today.  Keeping her legs elevated by 2 pillows.  Son at bedside   Her lower extremity edema has been improving.  REVIEW OF SYSTEMS:  Review of Systems  Constitutional: Positive for malaise/fatigue. Negative for chills, fever and weight loss.  HENT: Negative for congestion, hearing loss and sore throat.   Eyes: Negative for blurred vision and double vision.  Respiratory: Negative for cough, shortness of breath and wheezing.   Cardiovascular: Negative for chest pain, palpitations, orthopnea and leg swelling.  Gastrointestinal: Negative for abdominal pain, diarrhea, nausea and vomiting.  Genitourinary: Negative for dysuria and urgency.  Musculoskeletal: Negative for myalgias.  Skin: Negative for rash.  Neurological: Negative for dizziness, sensory change, speech change, focal weakness and headaches.  Psychiatric/Behavioral: Negative for depression.   DRUG ALLERGIES:   Allergies  Allergen Reactions  . Ace Inhibitors     Cough  . Doxycycline Nausea Only  . Hydrocodone Itching and Nausea Only  . Mercury     Other reaction(s): Unknown  . Silver Dermatitis    Severe itching  . Cephalexin Rash  . Clindamycin/Lincomycin Rash  . Nickel Rash  . Penicillins Rash    Has patient had a PCN reaction causing immediate rash, facial/tongue/throat swelling, SOB or lightheadedness with hypotension: YES Has patient had a PCN reaction causing severe rash involving mucus membranes or skin necrosis: NO Has patient had a PCN reaction that required hospitalization NO Has patient had a PCN reaction occurring within the last 10 years: NO If all of the above answers are "NO", then may proceed with Cephalosporin use.   VITALS:  Blood pressure (!) 107/51, pulse 64, temperature (!) 97.5 F (36.4 C),  temperature source Oral, resp. rate 18, height 4\' 11"  (1.499 m), weight 64.4 kg, SpO2 98 %. PHYSICAL EXAMINATION:   GENERAL:  84 y.o.-year-old patient lying in the bed with no acute distress.  EYES: Pupils equal, round, reactive to light and accommodation. No scleral icterus. Extraocular muscles intact.  HEENT: Head atraumatic, normocephalic. Oropharynx and nasopharynx clear.  NECK:  Supple, no jugular venous distention. No thyroid enlargement, no tenderness.  LUNGS: + Diminished breath sounds in the lung bases bilaterally. No use of accessory muscles of respiration.  CARDIOVASCULAR: RRR, S1, S2 normal. +loud systolic murmur, no rubs, or gallops.  ABDOMEN: Soft, nontender, nondistended. Bowel sounds present.  MUSCULOSKELETAL: No LE edema.  Ace wrap to bilateral legs, no cyanosis, or clubbing.  NEUROLOGIC:Alert and oriented x 3. CN 2-12 intact. Sensation intact to light touch. Gait not tested. PSYCHIATRIC: The patient is alert and oriented x 3.  SKIN: dry ACE bandages over bilateral legs.  DATA REVIEWED:  LABORATORY PANEL:  Female CBC Recent Labs  Lab 12/11/18 0501  WBC 4.7  HGB 8.5*  HCT 29.6*  PLT 187   ------------------------------------------------------------------------------------------------------------------ Chemistries  Recent Labs  Lab 12/12/18 0506  NA 143  K 3.8  CL 103  CO2 33*  GLUCOSE 94  BUN 23  CREATININE 1.17*  CALCIUM 9.2   RADIOLOGY:  No results found. ASSESSMENT AND PLAN:   Acute on chronic combined systolic and diastolic CHF- resolved. -Continue low dose coreg -Will hold losartan today due to low BPs -Continue home lasix 40mg  po bid -Daily weights, strict I/O -Cardiology following -We will encourage the patient to use customized  TED hose on right lower leg after son brings it from home  Paroxysmal atrial fibrillation + Mechanical MVR- AV paced. -Continue coumadin and amiodarone -INR at 3.1  Chronic respiratory failure secondary to COPD-   no signs of exacerbation. Currently on home 3L O2. -Duonebs prn -Pulmonology is following.  Appreciate the recommendations  Left lower extremity wound status post debridement of large hematoma -Dr. Bary Castilla following  Elevated troponin- likely demand ischemia. Troponins flat.   Hypertension- BPs have been soft -Continue low dose coreg -Holding losartan for now   CKD III - creatinine better than baseline -Continue to monitor and avoid nephrotoxins  DVT prophylaxis- coumadin.  Ambulating in the hallway with nurses assistance  Plan for discharge on Tuesday 6/16 to SNF or possibly home with [facility].  All the records are reviewed and case discussed with Care Management/Social Worker. Management plans discussed with the patient, son [name] at bedside and they are in agreement.  CODE STATUS: Full Code  TOTAL TIME TAKING CARE OF THIS PATIENT: 39 minutes.   More than 50% of the time was spent in counseling/coordination of care: YES  POSSIBLE D/C IN 4-5 DAYS, DEPENDING ON CLINICAL CONDITION.   on 12/12/2018 at 2:55 PM  Between 7am to 6pm - Pager - [phone]   After 6pm go to [email or web address] - Proofreader  [facility]  Office  [phone]  CC: Primary care physician; Rusty Aus, MD  Note: This dictation was prepared with Dragon dictation along with smaller phrase technology. Any transcriptional errors that result from this process are unintentional.

## 2018-12-12 NOTE — Progress Notes (Signed)
Pulmonary Medicine          Date: 12/12/2018,   MRN# [id number] [name] [birth year]     AdmissionWeight: 66.5 kg                 CurrentWeight: 64.4 kg     SUBJECTIVE     84 year old female with acute on chronic systolic CHF and CKD with acute on chronic hypoxemia, complicated by atelectasis, deconditioning and moderate to severe protein calorie malnutrition.  Patient was able to sit up on her own, appears to be stronger today.  Mental status has significantly improved.  Appears to be euvolemic less crackles on auscultation and peripheral edema, net 3700- cardiology on case appreciate input  Renal function has improved- nephrology on case appreciate input  [employer] setting up [employer] vest for home use, please continue BID metaneb while inpatient. IS and Acapella to send to [facility] with patient.    Marland Kitchen    PAST MEDICAL HISTORY   Past Medical History:  Diagnosis Date   Arthritis    "back, hands, knees" (04/10/2016)   Chronic back pain    Chronic combined systolic (congestive) and diastolic (congestive) heart failure (HCC)    a. EF 25% by echo in 08/2015 b. RHC in 08/2015 showed normal filling pressures; c. 10/2018 Echo (in setting of atrial tachycardia): EF 25-30%, impaired relaxation. Glob HK. RVSP 59mmHg. Mod dil RA/LA. Mech MV w/ nl fxn (mean grad 69mmHg). Mod TR.    Chronic kidney disease    Compression fracture    "several; all in my back" (04/10/2016)   GERD (gastroesophageal reflux disease)    Hypertension    Lymphedema    Pacemaker 05/2017   PAF (paroxysmal atrial fibrillation) ([place])    a. On amio & Coumadin - CHA2DS2VASc = 4.   S/P MVR (mitral valve replacement)    a. MVR 1994 b. redo MVR in 08/2014 - on Coumadin   Shortness of breath dyspnea      SURGICAL HISTORY   Past Surgical History:  Procedure Laterality Date   BREAST LUMPECTOMY Right 2005?   benign lump excision   CARDIAC CATHETERIZATION     CARDIAC VALVE  REPLACEMENT  1994, 2016   "MVR; MVR"   CARDIOVERSION N/A 11/06/2018   Procedure: CARDIOVERSION;  Surgeon: Minna Merritts, MD;  Location: [facility];  Service: Cardiovascular;  Laterality: N/A;   CATARACT EXTRACTION W/PHACO Right 02/20/2015   Procedure: CATARACT EXTRACTION PHACO AND INTRAOCULAR LENS PLACEMENT ([place]);  Surgeon: Estill Cotta, MD;  Location: [facility];  Service: Ophthalmology;  Laterality: Right;  US01:31.2 AP 25.3% CDE40.13 Fluid lot # [id number]   DRESSING CHANGE UNDER ANESTHESIA Left 11/17/2018   Procedure: DRESSING CHANGE;  Surgeon: Robert Bellow, MD;  Location: [facility];  Service: General;  Laterality: Left;  no anesthesia   ELECTROPHYSIOLOGIC STUDY N/A 10/09/2015   Procedure: CARDIOVERSION;  Surgeon: Wellington Hampshire, MD;  Location: [facility];  Service: Cardiovascular;  Laterality: N/A;   INCISION AND DRAINAGE OF WOUND Left 11/13/2018   Procedure: LEFT LEG DRESSING CHANGE;  Surgeon: Robert Bellow, MD;  Location: [facility];  Service: General;  Laterality: Left;   INCISION AND DRAINAGE OF WOUND Left 11/19/2018   Procedure: LEFT LEG DRESSING CHANGE AND GRAFT APPLICATION;  Surgeon: Robert Bellow, MD;  Location: [facility];  Service: General;  Laterality: Left;   IR GENERIC HISTORICAL  04/01/2016   IR RADIOLOGIST EVAL & MGMT 04/01/2016 [facility]   IR GENERIC HISTORICAL  04/15/2016   IR VERTEBROPLASTY CERV/THOR BX INC UNI/BIL INC/INJECT/IMAGING 04/15/2016 Luanne Bras, MD [facility]   IR GENERIC HISTORICAL  04/15/2016   IR VERTEBROPLASTY EA ADDL (T&LS) BX INC UNI/BIL INC INJECT/IMAGING 04/15/2016 Luanne Bras, MD [facility]   IR GENERIC HISTORICAL  04/15/2016   IR VERTEBROPLASTY EA ADDL (T&LS) BX INC UNI/BIL INC INJECT/IMAGING 04/15/2016 Luanne Bras, MD [facility]   IR GENERIC HISTORICAL  05/20/2016   IR RADIOLOGIST EVAL & MGMT 05/20/2016 [facility]   IR GENERIC HISTORICAL  06/13/2016   IR VERTEBROPLASTY EA ADDL (T&LS) BX INC  UNI/BIL INC INJECT/IMAGING 06/13/2016 Luanne Bras, MD [facility]   IR GENERIC HISTORICAL  06/13/2016   IR SACROPLASTY BILATERAL 06/13/2016 Luanne Bras, MD [facility]   IR GENERIC HISTORICAL  06/13/2016   IR VERTEBROPLASTY LUMBAR BX INC UNI/BIL INC/INJECT/IMAGING 06/13/2016 Luanne Bras, MD [facility]   IRRIGATION AND DEBRIDEMENT HEMATOMA Left 07/05/2015   Procedure: IRRIGATION AND DEBRIDEMENT HEMATOMA;  Surgeon: Robert Bellow, MD;  Location: [facility];  Service: General;  Laterality: Left;   IRRIGATION AND DEBRIDEMENT HEMATOMA Left 11/11/2018   Procedure: IRRIGATION AND DEBRIDEMENT LEFT LEG HEMATOMA;  Surgeon: Robert Bellow, MD;  Location: [facility];  Service: General;  Laterality: Left;   pyloric stenosis  07/1937   US ECHOCARDIOGRAPHY       FAMILY HISTORY   Family History  Problem Relation Age of Onset   Stroke Mother    Renal Disease Father      SOCIAL HISTORY   Social History   Tobacco Use   Smoking status: Former Smoker    Packs/day: 1.00    Years: 27.00    Pack years: 27.00    Types: Cigarettes    Quit date: 07/01/1980    Years since quitting: 38.4   Smokeless tobacco: Never Used   Tobacco comment: "quit smoking ~ 1980  Substance Use Topics   Alcohol use: Not Currently    Alcohol/week: 0.0 standard drinks    Comment: 04/10/2016 "I'll have a drink on holidays/special occasions"   Drug use: No     MEDICATIONS    Home Medication:    Current Medication:  Current Facility-Administered Medications:    0.9 %  sodium chloride infusion, 250 mL, Intravenous, PRN, Seals, Angela H, NP   acetaminophen (TYLENOL) tablet 650 mg, 650 mg, Oral, Q4H PRN, Seals, Angela H, NP, 650 mg at 12/05/18 2127   albuterol (PROVENTIL) (2.5 MG/3ML) 0.083% nebulizer solution 2.5 mg, 2.5 mg, Nebulization, Q6H PRN, Seals, Angela H, NP, 2.5 mg at 12/11/18 0981   amiodarone (PACERONE) tablet 200 mg, 200 mg, Oral, Daily, Seals, Angela H, NP, 200 mg  at 12/12/18 0931   carvedilol (COREG) tablet 3.125 mg, 3.125 mg, Oral, BID WC, Arida, Muhammad A, MD, 3.125 mg at 12/12/18 0931   cholecalciferol (VITAMIN D) tablet 2,000 Units, 2,000 Units, Oral, Daily, Seals, Angela H, NP, 2,000 Units at 12/12/18 0931   feeding supplement (ENSURE ENLIVE) (ENSURE ENLIVE) liquid 237 mL, 237 mL, Oral, BID BM, Mayo, Pete Pelt, MD, 237 mL at 12/07/18 1435   furosemide (LASIX) tablet 40 mg, 40 mg, Oral, BID, Visser, Jacquelyn D, PA-C, 40 mg at 12/12/18 0931   hydrOXYzine (ATARAX/VISTARIL) tablet 25 mg, 25 mg, Oral, TID PRN, Mayo, Pete Pelt, MD, 25 mg at 12/12/18 0136   ipratropium-albuterol (DUONEB) 0.5-2.5 (3) MG/3ML nebulizer solution 3 mL, 3 mL, Nebulization, Q6H PRN, Seals, Angela H, NP, 3 mL at 12/10/18 1347   losartan (COZAAR) tablet 12.5 mg, 12.5 mg, Oral,  Daily, Kathlyn Sacramento A, MD, 12.5 mg at 12/12/18 0931   ondansetron (ZOFRAN) injection 4 mg, 4 mg, Intravenous, Q6H PRN, Seals, [name] Dills, NP   oxyCODONE (Oxy IR/ROXICODONE) immediate release tablet 2.5 mg, 2.5 mg, Oral, Q6H PRN, Seals, Angela H, NP, 2.5 mg at 12/08/18 1754   polyethylene glycol (MIRALAX / GLYCOLAX) packet 17 g, 17 g, Oral, Daily, Seals, Angela H, NP, 17 g at 12/09/18 9924   potassium chloride SA (K-DUR) CR tablet 20 mEq, 20 mEq, Oral, Daily, Seals, Angela H, NP, 20 mEq at 12/12/18 0931   sodium chloride flush (NS) 0.9 % injection 3 mL, 3 mL, Intravenous, Q12H, Seals, Angela H, NP, 3 mL at 12/12/18 0935   sodium chloride flush (NS) 0.9 % injection 3 mL, 3 mL, Intravenous, PRN, Seals, [name] Dills, NP   vitamin C (ASCORBIC ACID) tablet 500 mg, 500 mg, Oral, Daily, Seals, Angela H, NP, 500 mg at 12/12/18 2683   warfarin (COUMADIN) tablet 2 mg, 2 mg, Oral, ONCE-1800, Gouru, Aruna, MD   Warfarin - Pharmacist Dosing Inpatient, , Does not apply, q1800, Mayo, Pete Pelt, MD   zinc sulfate capsule 220 mg, 220 mg, Oral, Daily, Seals, Angela H, NP, 220 mg at 12/12/18 0931    ALLERGIES    Ace inhibitors, Doxycycline, Hydrocodone, Mercury, Silver, Cephalexin, Clindamycin/lincomycin, Nickel, and Penicillins     REVIEW OF SYSTEMS    Review of Systems:  Gen:  Denies  fever, sweats, chills weigh loss  HEENT: Denies blurred vision, double vision, ear pain, eye pain, hearing loss, nose bleeds, sore throat Cardiac:  No dizziness, chest pain or heaviness, chest tightness,edema Resp:   Denies cough or sputum porduction, shortness of breath,wheezing, hemoptysis,  Gi: Denies swallowing difficulty, stomach pain, nausea or vomiting, diarrhea, constipation, bowel incontinence Gu:  Denies bladder incontinence, burning urine Ext:   Denies Joint pain, stiffness or swelling Skin: Denies  skin rash, easy bruising or bleeding or hives Endoc:  Denies polyuria, polydipsia , polyphagia or weight change Psych:   Denies depression, insomnia or hallucinations   Other:  All other systems negative   VS: BP (!) 107/51 (BP Location: Left Arm)    Pulse 64    Temp (!) 97.5 F (36.4 C) (Oral)    Resp 18    Ht 4\' 11"  (1.499 m)    Wt 64.4 kg    SpO2 98%    BMI 28.68 kg/m      PHYSICAL EXAM    GENERAL:NAD, no fevers, chills, no weakness no fatigue HEAD: Normocephalic, atraumatic.  EYES: Pupils equal, round, reactive to light. Extraocular muscles intact. No scleral icterus.  MOUTH: Moist mucosal membrane. Dentition intact. No abscess noted.  EAR, NOSE, THROAT: Clear without exudates. No external lesions.  NECK: Supple. No thyromegaly. No nodules. No JVD.  PULMONARY: mild bibasilar crackles with decreased bs bilaterally.  CARDIOVASCULAR: S1 and S2. Regular rate and rhythm. No murmurs, rubs, or gallops. No edema. Pedal pulses 2+ bilaterally.  GASTROINTESTINAL: Soft, nontender, nondistended. No masses. Positive bowel sounds. No hepatosplenomegaly.  MUSCULOSKELETAL: No swelling, clubbing, or edema. Range of motion full in all extremities.  NEUROLOGIC: Cranial nerves II through XII are intact. No  gross focal neurological deficits. Sensation intact. Reflexes intact.  SKIN: No ulceration, lesions, rashes, or cyanosis. Skin warm and dry. Turgor intact.  PSYCHIATRIC: Mood, affect within normal limits. The patient is awake, alert and oriented x 3. Insight, judgment intact.       IMAGING    Dg Chest 1 View  Result  Date: 12/08/2018 CLINICAL DATA:  Shortness of breath EXAM: CHEST  1 VIEW COMPARISON:  12/07/2018 FINDINGS: Prior median sternotomy and valve replacement. Left pacer remains in place, unchanged. Cardiomegaly with vascular congestion. Diffuse interstitial and airspace opacities lung with small effusions again noted. No real change. IMPRESSION: No significant change since prior study. Electronically Signed   By: Rolm Baptise M.D.   On: 12/08/2018 08:50   Dg Chest 1 View  Result Date: 12/07/2018 CLINICAL DATA:  Shortness of breath EXAM: CHEST  1 VIEW COMPARISON:  12/06/2018 FINDINGS: Cardiac shadow is enlarged but stable. Pacing device and postsurgical changes are again seen. Small effusions are again seen and stable. Patchy infiltrates are again noted right greater than left but stable from the prior exam. No new focal abnormality is noted. IMPRESSION: Stable airspace opacities right greater than left with associated small effusions. Electronically Signed   By: Inez Catalina M.D.   On: 12/07/2018 07:30   Dg Chest 1 View  Result Date: 12/06/2018 CLINICAL DATA:  Shortness of breath.  Lower extremity edema. EXAM: CHEST  1 VIEW COMPARISON:  Chest radiograph 12/05/2018 FINDINGS: Multi lead pacer apparatus overlies the left hemithorax. Leads are stable in position. Stable cardiomegaly. Similar-appearing patchy areas of consolidation within the mid and lower lungs bilaterally. Bilateral small to moderate pleural effusions. No pneumothorax. IMPRESSION: Cardiomegaly. Moderate bilateral pleural effusions with underlying consolidation favored to represent combination of atelectasis and edema.  Superimposed infection not excluded. Electronically Signed   By: Lovey Newcomer M.D.   On: 12/06/2018 06:44   Dg Chest 2 View  Result Date: 12/06/2018 CLINICAL DATA:  84 year old female with history of hepatocellular carcinoma. Hypertension and shortness of breath. EXAM: CHEST - 2 VIEW COMPARISON:  Chest x-ray 11/13/2018. FINDINGS: Lung volumes are low. Moderate bilateral pleural effusions. Bibasilar areas of atelectasis and/or consolidation. Cephalization of the pulmonary vasculature with mild diffuse increased interstitial markings. Mild cardiomegaly. Upper mediastinal contours are within normal limits given the low lung volumes and patient rotation to the right. Aortic atherosclerosis. Status post median sternotomy. Left-sided biventricular pacemaker in position with lead tips projecting over the expected location of the right atrium, right ventricular apex and left ventricle (via the coronary sinus and coronary veins). Status post mitral valve replacement. Post vertebroplasty changes in midthoracic vertebral bodies again noted. IMPRESSION: 1. The appearance the chest suggests worsening congestive heart failure, as above. 2. Bibasilar opacities may reflect areas of atelectasis and/or consolidation. 3. Aortic atherosclerosis. Electronically Signed   By: Vinnie Langton M.D.   On: 12/06/2018 19:48   Dg Chest 2 View  Result Date: 11/13/2018 CLINICAL DATA:  84 year old female with shortness of breath. Traumatic bleeding recently, on Coumadin. EXAM: CHEST - 2 VIEW COMPARISON:  11/04/2018 and earlier. FINDINGS: Seated AP and lateral views of the chest. Stable cardiomegaly and mediastinal contours. Previous cardiac valve replacement. Stable left chest pacemaker. Regressed bilateral pulmonary interstitial opacity. Mild residual patchy opacity now about the left hilum and in the right upper lung. On the lateral view there are small bilateral pleural effusions with some fluid tracking into the minor fissure.  Osteopenia with progressed thoracic compression fractures since 2017. Two levels previously augmented. Negative visible bowel gas pattern. IMPRESSION: 1. Regressed pulmonary edema since 11/04/2018. Small bilateral pleural effusions. 2. Patchy nonspecific perihilar opacity might reflect residual edema. Acute infection difficult to exclude. Electronically Signed   By: Genevie Ann M.D.   On: 11/13/2018 10:40   Dg Chest Port 1 View  Result Date: 12/05/2018 CLINICAL DATA:  Congestive  heart failure. EXAM: PORTABLE CHEST 1 VIEW COMPARISON:  11/13/2018 FINDINGS: There is a new airspace opacity that appears to be centered within the right middle lobe. The cardiac size is enlarged. The lung volumes are low. There are additional hazy airspace opacities bilaterally with prominent interstitial lung markings. There is a multi lead left-sided pacemaker in place. The patient is status post prior mitral valve replacement. There are probable small bilateral pleural effusions. The patient appears to be status post multilevel vertebral augmentation. IMPRESSION: 1. Airspace opacity in the right middle lobe concerning for a developing infiltrate or atelectasis. 2. Cardiomegaly with volume overload and small bilateral pleural effusions. Electronically Signed   By: Constance Holster M.D.   On: 12/05/2018 03:24   Dg Foot Complete Left  Result Date: 11/24/2018 CLINICAL DATA:  Lateral left foot pain for 2-3 days with a wound and bruising. EXAM: LEFT FOOT - COMPLETE 3+ VIEW COMPARISON:  None. FINDINGS: The bones are diffusely osteopenic. There is minimal soft tissue prominence at the base of the fifth metatarsal. No acute fracture, dislocation, osseous erosion, soft tissue emphysema, radiopaque foreign body is identified. Extensive atherosclerotic vascular calcifications are noted. IMPRESSION: No acute osseous abnormality identified. Electronically Signed   By: Logan Bores M.D.   On: 11/24/2018 17:25      ASSESSMENT/PLAN    Acute  on chronic hypoxemic respiratory failure -Due to acute decompensated systolic CHF with EF 25 to 30% -Cardiology on case appreciate input-follow recommendations -Currently holding diuresis due to soft pressures -Complicated by CKD and significant chronic anemia- -nephro on case - renal function improving - appreciate input -Palliative consult in progress -s/p ABG - acceptable numbers without signficant hypercapnia. -please hold benadryl and xanax due to confusion and hallucinations     Bilateral atelectasis -Contributing to hypoxemia - chest PT - MetaNeb BID - Incentive spirometer to be used each hour multiple times - please encourage to use  - patient has difficulty expectorating phlegm -continue Acapella -Afflovest approval for home in progress    Moderate protein calorie malnutrition and deconditioning -Contributing to dyspnea -Patient working with physical therapy was able to get out of bed and slowly walk today but encouraged to continue this as well as outpatient -Nutritional assessment patient has been aspirating and has been evaluated with modified barium swallow recommended aspiration precautions-will place consultation for dietitian eval -has ensure at bedside      Thank you for allowing me to participate in the care of this patient.   Patient/Family are satisfied with care plan and all questions have been answered.  This document was prepared using Dragon voice recognition software and may include unintentional dictation errors.     Ottie Glazier, M.D.  Division of [place]

## 2018-12-12 NOTE — Consult Note (Signed)
[place] for warfarin dose management Indication: atrial fibrillation  Patient Measurements: Height: 4\' 11"  (149.9 cm) Weight: 142 lb (64.4 kg) IBW/kg (Calculated) : 43.2  Vital Signs: Temp: 97.5 F (36.4 C) (06/13 0733) Temp Source: Oral (06/13 0733) BP: 107/51 (06/13 0733) Pulse Rate: 64 (06/13 0733)  Labs: Recent Labs    12/10/18 0449 12/11/18 0501 12/12/18 0506  HGB 8.3* 8.5*  --   HCT 28.0* 29.6*  --   PLT 176 187  --   LABPROT 26.7* 27.6* 31.6*  INR 2.5* 2.6* 3.1*  CREATININE 1.43* 1.33* 1.17*    Estimated Creatinine Clearance: 30.8 mL/min (A) (by C-G formula based on SCr of 1.17 mg/dL (H)).   Medical History: Past Medical History:  Diagnosis Date  . Arthritis    "back, hands, knees" (04/10/2016)  . Chronic back pain   . 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 EF 25% by echo in 08/2015 b. RHC in 08/2015 showed normal filling pressures; c. 10/2018 Echo (in setting of atrial tachycardia): EF 25-30%, impaired relaxation. Glob HK. RVSP 42mmHg. Mod dil RA/LA. Mech MV w/ nl fxn (mean grad 35mmHg). Mod TR.   Marland Kitchen Chronic kidney disease   . Compression fracture    "several; all in my back" (04/10/2016)  . GERD (gastroesophageal reflux disease)   . Hypertension   . Lymphedema   . Pacemaker 05/2017  . PAF (paroxysmal atrial fibrillation) ([place])    a. On amio & Coumadin - CHA2DS2VASc = 4.  . S/P MVR (mitral valve replacement)    a. MVR 1994 b. redo MVR in 08/2014 - on Coumadin  . Shortness of breath dyspnea     Medications:  Scheduled:  . amiodarone  200 mg Oral Daily  . carvedilol  3.125 mg Oral BID WC  . cholecalciferol  2,000 Units Oral Daily  . feeding supplement (ENSURE ENLIVE)  237 mL Oral BID BM  . furosemide  40 mg Oral BID  . losartan  12.5 mg Oral Daily  . polyethylene glycol  17 g Oral Daily  . potassium chloride SA  20 mEq Oral Daily  . sodium chloride flush  3  mL Intravenous Q12H  . vitamin C  500 mg Oral Daily  . Warfarin - Pharmacist Dosing Inpatient   Does not apply q1800  . zinc sulfate  220 mg Oral Daily    Assessment: Pt was on warfarin previously for atrial fibrillation with mechanical mitral valve. On admission patient's INR was 3.1 (up to 3.6 today). Pt is on amiodarone which can increase INR. Pt was discharged from the hospital couple of weeks ago and warfarin dose was decreased ~10% from previous dose. Pt was discharged on a regimen of 4 mg daily. Her previous dose wasf warfarin 6 mg MWF and 3 mg TThSaSun.  Home warfarin dose: At home patient takes 4 mg daily- she is a resident at [facility] in [place]. The facility was called to confirm the dose changed from prior admission.   Today's INR is slightly supratherapeutic.    --DDI with amiodarone (home med)   6/5 INR 3.1 6/6 INR 3.6  HELD 6/7 INR 3.0 (3 mg given) 6/8 INR 2.6 (4 mg given) 6/9 INR 2.5 (4 mg given) 6/10 INR 2.6 (4 mg given) 6/11 INR 2.5 (4 mg given) 6/12 INR 2.6 (4 mg given) 6/13 INR 3.1   Goal of Therapy:  INR 2-3 (per cardiology on previous visit d/t bleed during  that admission, even with valve). Update: Discussed with Dr.  Saunders Revel INR goal. Will continue to keep goal of 2-3, but will favor an estimated range of 2.5-3.  Monitor platelets by anticoagulation protocol: Yes   Plan:  Will cut the dose in half and order warfarin 2 mg x 1  and repeat INR in AM. May need to adjust regimen to 4 mg Monday through Friday, and 2 mg Saturday and Sunday for a ~ 10% decrease from current home dose).   Oswald Hillock, PharmD, BCPS 12/12/2018,9:07 AM

## 2018-12-12 NOTE — Progress Notes (Signed)
OT Cancellation Note  Patient Details Name: [name] MRN: [id number] DOB: Apr 14, 1935   Cancelled Treatment:    Reason Eval/Treat Not Completed: Other (comment). Upon attempt, pt ambulating around nurses unit with RN and using RW. Will re-attempt OT treatment at later date/time as pt is available and appropriate.   Jeni Salles, MPH, MS, OTR/L ascom [phone] 12/12/18, 2:57 PM

## 2018-12-13 LAB — PROTIME-INR
INR: 3.1 — ABNORMAL HIGH (ref 0.8–1.2)
Prothrombin Time: 31.1 seconds — ABNORMAL HIGH (ref 11.4–15.2)

## 2018-12-13 MED ORDER — WARFARIN SODIUM 2 MG PO TABS
2.0000 mg | ORAL_TABLET | Freq: Once | ORAL | Status: AC
  Administered 2018-12-13: 2 mg via ORAL
  Filled 2018-12-13: qty 1

## 2018-12-13 MED ORDER — FUROSEMIDE 20 MG PO TABS
20.0000 mg | ORAL_TABLET | Freq: Every day | ORAL | Status: DC
  Administered 2018-12-13: 20 mg via ORAL
  Filled 2018-12-13: qty 1

## 2018-12-13 MED ORDER — FUROSEMIDE 40 MG PO TABS
40.0000 mg | ORAL_TABLET | Freq: Two times a day (BID) | ORAL | Status: DC
  Administered 2018-12-13 – 2018-12-14 (×2): 40 mg via ORAL
  Filled 2018-12-13 (×2): qty 1

## 2018-12-13 NOTE — Progress Notes (Signed)
fvc is 0.88 L   fev1 is 0.53 L fev1/fvc 60.2.

## 2018-12-13 NOTE — Progress Notes (Signed)
Physical Therapy Treatment Patient Details Name: [name] MRN: [id number] DOB: [birth year] Today's Date: 12/13/2018    History of Present Illness Kenidi Elenbaas is an 65yoF who comes to [facility] from cardiology after a/c exacerbation of sCHF. Pt has been at [facility] for <1W s/p DC from this site for a similar problem. PMH: AF, chronic back pain, combined CHF, CKD, compression fracture of spine, PPM, MR, pulmonary HTN, LEE, chronic O2 use 3L, recently 4-6L.     PT Comments    Pt in bed this am wanting to get up to chair but declined gait at this time.  Assisted to chair and set up for breakfast.  Returned prior to lunch and assisted pt in ambulation around unit on 4 lpm (3 lpm at rest) with walker and min guard.  Pt asked to be weighed in hallway during gait 144.1.  Pt left on commode per her request.  Daughter in room.  Discussed discharge plan.  Pt and daughter feel comfortable with discharge home due to improved mobility.  They do request bedside commode for home but ask for oblong seat.  Told pt/daughter typical commode is standard seat and I am unsure if it is available through DME/insurance or if that is a private pay option.  Will defer to discharge planning.   Follow Up Recommendations  Home health PT;Supervision for mobility/OOB     Equipment Recommendations  3in1 (PT)    Recommendations for Other Services       Precautions / Restrictions Precautions Precautions: Fall Precaution Comments: Per Dr Bary Castilla, pt has no weight bearing precautions Restrictions Weight Bearing Restrictions: No Other Position/Activity Restrictions: Per Dr. Bary Castilla, pt has no weight bearing precautions    Mobility  Bed Mobility Overal bed mobility: Modified Independent Bed Mobility: Supine to Sit     Supine to sit: Supervision        Transfers     Transfers: Sit to/from Stand;Stand Pivot Transfers Sit to Stand: Supervision Stand pivot transfers: Supervision           Ambulation/Gait Ambulation/Gait assistance: Min guard Gait Distance (Feet): 180 Feet Assistive device: Rolling walker (2 wheeled) Gait Pattern/deviations: Step-through pattern;Decreased step length - right;Decreased step length - left;Trunk flexed Gait velocity: decreased       Stairs             Wheelchair Mobility    Modified Rankin (Stroke Patients Only)       Balance Overall balance assessment: Needs assistance Sitting-balance support: Feet unsupported Sitting balance-Leahy Scale: Good     Standing balance support: Bilateral upper extremity supported Standing balance-Leahy Scale: Good                              Cognition Arousal/Alertness: Awake/alert Behavior During Therapy: WFL for tasks assessed/performed Overall Cognitive Status: Within Functional Limits for tasks assessed                                        Exercises      General Comments        Pertinent Vitals/Pain Pain Assessment: No/denies pain    Home Living                      Prior Function            PT Goals (current goals can  now be found in the care plan section) Progress towards PT goals: Progressing toward goals    Frequency    Min 2X/week      PT Plan Discharge plan needs to be updated    Co-evaluation              AM-PAC PT "6 Clicks" Mobility   Outcome Measure  Help needed turning from your back to your side while in a flat bed without using bedrails?: A Little Help needed moving from lying on your back to sitting on the side of a flat bed without using bedrails?: A Little Help needed moving to and from a bed to a chair (including a wheelchair)?: A Little Help needed standing up from a chair using your arms (e.g., wheelchair or bedside chair)?: A Little Help needed to walk in hospital room?: A Little Help needed climbing 3-5 steps with a railing? : A Little 6 Click Score: 18    End of Session Equipment  Utilized During Treatment: Gait belt;Oxygen Activity Tolerance: Patient tolerated treatment well Patient left: Other (comment);with family/visitor present Nurse Communication: Other (comment)       Time: 0300-9233 PT Time Calculation (min) (ACUTE ONLY): 29 min  Charges:  $Gait Training: 8-22 mins $Therapeutic Activity: 8-22 mins                     Chesley Noon, PTA 12/13/18, 11:47 AM

## 2018-12-13 NOTE — Plan of Care (Signed)
  Problem: Clinical Measurements: Goal: Will remain free from infection Outcome: Progressing   Problem: Activity: Goal: Risk for activity intolerance will decrease Outcome: Progressing   Problem: Elimination: Goal: Will not experience complications related to urinary retention Outcome: Progressing   Problem: Activity: Goal: Capacity to carry out activities will improve Outcome: Progressing   Problem: Cardiac: Goal: Ability to achieve and maintain adequate cardiopulmonary perfusion will improve Outcome: Progressing

## 2018-12-13 NOTE — Progress Notes (Signed)
The hospital is completely out of ABDs.  The leg dressing was done with layers of 4x4 gauze in place of the ABD's.

## 2018-12-13 NOTE — Consult Note (Signed)
[place] for warfarin dose management Indication: atrial fibrillation  Patient Measurements: Height: 4\' 11"  (149.9 cm) Weight: 142 lb 8 oz (64.6 kg) IBW/kg (Calculated) : 43.2  Vital Signs: Temp: 97.7 F (36.5 C) (06/14 0825) Temp Source: Oral (06/14 0825) BP: 101/57 (06/14 0825) Pulse Rate: 60 (06/14 0825)  Labs: Recent Labs    12/11/18 0501 12/12/18 0506 12/13/18 0455  HGB 8.5*  --   --   HCT 29.6*  --   --   PLT 187  --   --   LABPROT 27.6* 31.6* 31.1*  INR 2.6* 3.1* 3.1*  CREATININE 1.33* 1.17*  --     Estimated Creatinine Clearance: 30.8 mL/min (A) (by C-G formula based on SCr of 1.17 mg/dL (H)).   Medical History: Past Medical History:  Diagnosis Date  . Arthritis    "back, hands, knees" (04/10/2016)  . Chronic back pain   . 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 EF 25% by echo in 08/2015 b. RHC in 08/2015 showed normal filling pressures; c. 10/2018 Echo (in setting of atrial tachycardia): EF 25-30%, impaired relaxation. Glob HK. RVSP 46mmHg. Mod dil RA/LA. Mech MV w/ nl fxn (mean grad 63mmHg). Mod TR.   Marland Kitchen Chronic kidney disease   . Compression fracture    "several; all in my back" (04/10/2016)  . GERD (gastroesophageal reflux disease)   . Hypertension   . Lymphedema   . Pacemaker 05/2017  . PAF (paroxysmal atrial fibrillation) ([place])    a. On amio & Coumadin - CHA2DS2VASc = 4.  . S/P MVR (mitral valve replacement)    a. MVR 1994 b. redo MVR in 08/2014 - on Coumadin  . Shortness of breath dyspnea     Medications:  Scheduled:  . amiodarone  200 mg Oral Daily  . carvedilol  3.125 mg Oral BID WC  . cholecalciferol  2,000 Units Oral Daily  . feeding supplement (ENSURE ENLIVE)  237 mL Oral BID BM  . furosemide  40 mg Oral BID  . losartan  12.5 mg Oral Daily  . polyethylene glycol  17 g Oral Daily  . potassium chloride SA  20 mEq Oral Daily  . sodium chloride flush   3 mL Intravenous Q12H  . vitamin C  500 mg Oral Daily  . Warfarin - Pharmacist Dosing Inpatient   Does not apply q1800  . zinc sulfate  220 mg Oral Daily    Assessment: Pt was on warfarin previously for atrial fibrillation with mechanical mitral valve. On admission patient's INR was 3.1 (up to 3.6 today). Pt is on amiodarone which can increase INR. Pt was discharged from the hospital couple of weeks ago and warfarin dose was decreased ~10% from previous dose. Pt was discharged on a regimen of 4 mg daily. Her previous dose wasf warfarin 6 mg MWF and 3 mg TThSaSun.  Home warfarin dose: At home patient takes 4 mg daily- she is a resident at [facility] in [place]. The facility was called to confirm the dose changed from prior admission.   Today's INR is slightly supratherapeutic.    --DDI with amiodarone (home med)   6/5 INR 3.1 6/6 INR 3.6  HELD 6/7 INR 3.0 (3 mg given) 6/8 INR 2.6 (4 mg given) 6/9 INR 2.5 (4 mg given) 6/10 INR 2.6 (4 mg given) 6/11 INR 2.5 (4 mg given) 6/12 INR 2.6 (4 mg given) 6/13 INR 3.1 (2 mg given) 6/13 INR 3.1  Goal of Therapy:  INR 2-3 (per cardiology on previous visit d/t bleed during that admission, even with valve). Update: Discussed with Dr.  Saunders Revel INR goal. Will continue to keep goal of 2-3, but will favor an estimated range of 2.5-3.  Monitor platelets by anticoagulation protocol: Yes   Plan:  Will order warfarin 2 mg x 1 and repeat INR in AM. May need to adjust regimen to 4 mg Monday through Friday, and 2 mg Saturday and Sunday for a ~ 10% decrease from current home dose.   Oswald Hillock, PharmD, BCPS 12/13/2018,9:48 AM

## 2018-12-13 NOTE — Progress Notes (Signed)
[place] [facility] [place] NAME: [name]    MR#:  [id number]  DATE OF BIRTH:  [birth year]  SUBJECTIVE:   Patient is feeling ok.out of bed to chair.  No overnight incidents.  Daughter [name] at bedside   Her lower extremity edema has been improving.  Patient is uncomfortable to wear SCD on right lower extremity  REVIEW OF SYSTEMS:  Review of Systems  Constitutional: Positive for malaise/fatigue. Negative for chills, fever and weight loss.  HENT: Negative for congestion, hearing loss and sore throat.   Eyes: Negative for blurred vision and double vision.  Respiratory: Negative for cough, shortness of breath and wheezing.   Cardiovascular: Negative for chest pain, palpitations, orthopnea and leg swelling.  Gastrointestinal: Negative for abdominal pain, diarrhea, nausea and vomiting.  Genitourinary: Negative for dysuria and urgency.  Musculoskeletal: Negative for myalgias.  Skin: Negative for rash.  Neurological: Negative for dizziness, sensory change, speech change, focal weakness and headaches.  Psychiatric/Behavioral: Negative for depression.   DRUG ALLERGIES:   Allergies  Allergen Reactions  . Ace Inhibitors     Cough  . Doxycycline Nausea Only  . Hydrocodone Itching and Nausea Only  . Mercury     Other reaction(s): Unknown  . Silver Dermatitis    Severe itching  . Cephalexin Rash  . Clindamycin/Lincomycin Rash  . Nickel Rash  . Penicillins Rash    Has patient had a PCN reaction causing immediate rash, facial/tongue/throat swelling, SOB or lightheadedness with hypotension: YES Has patient had a PCN reaction causing severe rash involving mucus membranes or skin necrosis: NO Has patient had a PCN reaction that required hospitalization NO Has patient had a PCN reaction occurring within the last 10 years: NO If all of the above answers are "NO", then may proceed with Cephalosporin use.   VITALS:  Blood pressure (!) 101/57,  pulse 60, temperature 97.7 F (36.5 C), temperature source Oral, resp. rate 19, height 4\' 11"  (1.499 m), weight 64.6 kg, SpO2 100 %. PHYSICAL EXAMINATION:   GENERAL:  84 y.o.-year-old patient lying in the bed with no acute distress.  EYES: Pupils equal, round, reactive to light and accommodation. No scleral icterus. Extraocular muscles intact.  HEENT: Head atraumatic, normocephalic. Oropharynx and nasopharynx clear.  NECK:  Supple, no jugular venous distention. No thyroid enlargement, no tenderness.  LUNGS: + Diminished breath sounds in the lung bases bilaterally. No use of accessory muscles of respiration.  CARDIOVASCULAR: RRR, S1, S2 normal. +loud systolic murmur, no rubs, or gallops.  ABDOMEN: Soft, nontender, nondistended. Bowel sounds present.  MUSCULOSKELETAL: No LE edema.  Ace wrap to bilateral legs, no cyanosis, or clubbing.  NEUROLOGIC:Alert and oriented x 3. CN 2-12 intact. Sensation intact to light touch. Gait not tested. PSYCHIATRIC: The patient is alert and oriented x 3.  SKIN: dry ACE bandages over bilateral legs.  DATA REVIEWED:  LABORATORY PANEL:  Female CBC Recent Labs  Lab 12/11/18 0501  WBC 4.7  HGB 8.5*  HCT 29.6*  PLT 187   ------------------------------------------------------------------------------------------------------------------ Chemistries  Recent Labs  Lab 12/12/18 0506  NA 143  K 3.8  CL 103  CO2 33*  GLUCOSE 94  BUN 23  CREATININE 1.17*  CALCIUM 9.2   RADIOLOGY:  No results found. ASSESSMENT AND PLAN:   Acute on chronic combined systolic and diastolic CHF- resolved. -Continue low dose coreg -Will hold losartan today due to low BPs -Change lasix to 40 mg in a.m. 40 mg at lunchtime and 20 mg at  suppertime as per patient's request -Daily weights, strict I/O -Cardiology following -We will encourage the patient to use customized TED hose on right lower leg after son brings it from home  Paroxysmal atrial fibrillation + Mechanical MVR-  AV paced. -Continue coumadin and amiodarone -INR at 3.1  Chronic respiratory failure secondary to COPD-  no signs of exacerbation. Currently on home 3L O2. -Duonebs prn -Pulmonology is following.  Appreciate the recommendations  Left lower extremity wound status post debridement of large hematoma -Dr. Bary Castilla following  Elevated troponin- likely demand ischemia. Troponins flat.   Hypertension- BPs have been soft -Continue low dose coreg -Holding losartan for now   CKD III - creatinine better than baseline -Continue to monitor and avoid nephrotoxins  DVT prophylaxis- coumadin.  Ambulating in the hallway with nurses assistance  Plan for discharge on Tuesday 6/16 to SNF /health PT.  Discussed with the daughter she will decide in a day or 2   All the records are reviewed and case discussed with Care Management/Social Worker. Management plans discussed with the patient, daughter [name] at bedside and they are in agreement.  CODE STATUS: Full Code  TOTAL TIME TAKING CARE OF THIS PATIENT: 33 minutes.   More than 50% of the time was spent in counseling/coordination of care: YES  POSSIBLE D/C IN 1-2  DAYS, DEPENDING ON CLINICAL CONDITION.   on 12/13/2018 at 1:29 PM  Between 7am to 6pm - Pager - [phone]   After 6pm go to [email or web address] - Proofreader  [facility]  Office  [phone]  CC: Primary care physician; Rusty Aus, MD  Note: This dictation was prepared with Dragon dictation along with smaller phrase technology. Any transcriptional errors that result from this process are unintentional.

## 2018-12-14 ENCOUNTER — Encounter: Payer: Self-pay | Admitting: Primary Care

## 2018-12-14 DIAGNOSIS — Z515 Encounter for palliative care: Secondary | ICD-10-CM

## 2018-12-14 DIAGNOSIS — Z7189 Other specified counseling: Secondary | ICD-10-CM

## 2018-12-14 DIAGNOSIS — S81802S Unspecified open wound, left lower leg, sequela: Secondary | ICD-10-CM

## 2018-12-14 DIAGNOSIS — M7981 Nontraumatic hematoma of soft tissue: Secondary | ICD-10-CM

## 2018-12-14 DIAGNOSIS — S81802A Unspecified open wound, left lower leg, initial encounter: Secondary | ICD-10-CM

## 2018-12-14 LAB — PROTIME-INR
INR: 3 — ABNORMAL HIGH (ref 0.8–1.2)
Prothrombin Time: 31 seconds — ABNORMAL HIGH (ref 11.4–15.2)

## 2018-12-14 LAB — BASIC METABOLIC PANEL
Anion gap: 8 (ref 5–15)
BUN: 25 mg/dL — ABNORMAL HIGH (ref 8–23)
CO2: 34 mmol/L — ABNORMAL HIGH (ref 22–32)
Calcium: 9.2 mg/dL (ref 8.9–10.3)
Chloride: 99 mmol/L (ref 98–111)
Creatinine, Ser: 1.45 mg/dL — ABNORMAL HIGH (ref 0.44–1.00)
GFR calc Af Amer: 39 mL/min — ABNORMAL LOW (ref >60)
GFR calc non Af Amer: 34 mL/min — ABNORMAL LOW (ref >60)
Glucose, Bld: 93 mg/dL (ref 70–99)
Potassium: 4 mmol/L (ref 3.5–5.1)
Sodium: 141 mmol/L (ref 135–145)

## 2018-12-14 LAB — CBC
HCT: 29.1 % — ABNORMAL LOW (ref 36.0–46.0)
Hemoglobin: 8.5 g/dL — ABNORMAL LOW (ref 12.0–15.0)
MCH: 25.3 pg — ABNORMAL LOW (ref 26.0–34.0)
MCHC: 29.2 g/dL — ABNORMAL LOW (ref 30.0–36.0)
MCV: 86.6 fL (ref 80.0–100.0)
Platelets: 163 10*3/uL (ref 150–400)
RBC: 3.36 MIL/uL — ABNORMAL LOW (ref 3.87–5.11)
RDW: 16.7 % — ABNORMAL HIGH (ref 11.5–15.5)
WBC: 5.5 10*3/uL (ref 4.0–10.5)
nRBC: 0 % (ref 0.0–0.2)

## 2018-12-14 MED ORDER — TRAMADOL HCL 50 MG PO TABS
50.0000 mg | ORAL_TABLET | Freq: Four times a day (QID) | ORAL | Status: DC | PRN
  Administered 2018-12-14: 50 mg via ORAL
  Filled 2018-12-14: qty 1

## 2018-12-14 MED ORDER — WARFARIN SODIUM 2 MG PO TABS
2.0000 mg | ORAL_TABLET | Freq: Once | ORAL | Status: AC
  Administered 2018-12-14: 2 mg via ORAL
  Filled 2018-12-14: qty 1

## 2018-12-14 MED ORDER — FUROSEMIDE 40 MG PO TABS
40.0000 mg | ORAL_TABLET | Freq: Two times a day (BID) | ORAL | Status: DC
  Administered 2018-12-15: 40 mg via ORAL
  Filled 2018-12-14: qty 1

## 2018-12-14 MED ORDER — HYDROXYZINE HCL 25 MG PO TABS: 25.0000 mg | ORAL_TABLET | Freq: Three times a day (TID) | ORAL | Status: DC | PRN

## 2018-12-14 MED ORDER — ESCITALOPRAM OXALATE 10 MG PO TABS: 5.0000 mg | ORAL_TABLET | Freq: Every day | ORAL | Status: DC

## 2018-12-14 NOTE — Progress Notes (Signed)
PT Cancellation Note  Patient Details Name: [name] MRN: [id number] DOB: [birth year]   Cancelled Treatment:    Reason Eval/Treat Not Completed: Other (comment);Patient at procedure or test/unavailable(Attempted to see patient a second time. Patient not available at second attempt due to Physicians in room wrapping LE's. Will attempt again at a later time/date.)  Janna Arch, PT, DPT   12/14/2018, 1:35 PM

## 2018-12-14 NOTE — TOC Progression Note (Signed)
Transition of Care [facility]) - Progression Note    Patient Details  Name: MAKELA NIEHOFF MRN: [id number] Date of Birth: [birth year]  Transition of Care [facility] [place]) CM/SW Contact  Elza Rafter, RN Phone Number: 12/14/2018, 3:42 PM  Clinical Narrative:   Plan is for patient to discharge to home tomorrow with [place] for RN, PT.  Brad with [employer] is assisting with NIV, Afflov[employer] and BSC.      Expected Discharge Plan: [place] Barriers to Discharge: Continued Medical Work up  Expected Discharge Plan and Services Expected Discharge Plan: [place]   Discharge Planning Services: CM Consult Post Acute Care Choice: Durable Medical Equipment, Home Health Living arrangements for the past 2 months: Single Family Home                 DME Arranged: NIV, Vest percussion DME Agency: [employer] Date DME Agency Contacted: 12/14/18 Time DME Agency Contacted: 0900 Representative spoke with at DME Agency: [place]: RN, PT [place] Agency: [place] ([place]) Date [place]: 12/14/18 Time [place]: [place] Representative spoke with at [place]: [place] ([place]) Interventions    Readmission Risk Interventions Readmission Risk Prevention Plan 12/07/2018 11/30/2018 11/09/2018  Transportation Screening Complete Complete Complete  PCP or Specialist Appt within 3-5 Days Complete - Complete  HRI or Home Care Consult Complete - Complete  Social Work Consult for [place] Planning/Counseling Not Complete - Not Complete  SW consult not completed comments NA - na  Palliative Care Screening Not Applicable - Not Applicable  Medication Review Press photographer) Complete Complete Complete  PCP or Specialist appointment within 3-5 days of discharge - Complete -  [place] or [place] - Complete -  SW Recovery Care/Counseling Consult - Not Complete -  SW Consult Not Complete Comments - na -   Palliative Care Screening - Not Applicable -  [place] - Complete -  Some recent data might be hidden

## 2018-12-14 NOTE — TOC Progression Note (Cosign Needed)
Transition of Care [facility]) - Progression Note    Patient Details  Name: [name] MRN: [id number] Date of Birth: 1935/03/03  Transition of Care [facility]) CM/SW Contact  Elza Rafter, RN Phone Number: 12/14/2018, 4:19 PM  Clinical Narrative:   Severe COPD and end stage emphysema; chronic respiratory failure with hypoxemia.  Patient continues to exhibit signs of hypercapnia associated with chronic respiratory failure secondary to severe COPD.  Patient requires the use of NIV both QHS and daytime to help with exacerbation periods.  The use of NIV will treat the patient's high PCO2 levels and can reduce risk of exacerbations and future hospitalizations when used at night and during the day.  Patient will need these advanced settings in conjunction with his current medication regimen; BIPAP with backup rate has been tried and failed.  Failure to have NIV available for use over a 24 hr period could lead to death.  Patient has been able to protect their airway and clear their own secretions.Geoffry Paradise, RNCM  [phone]      Expected Discharge Plan: [place] Barriers to Discharge: Continued Medical Work up  Expected Discharge Plan and Services Expected Discharge Plan: [place]   Discharge Planning Services: CM Consult Post Acute Care Choice: Durable Medical Equipment, Home Health Living arrangements for the past 2 months: Single Family Home                 DME Arranged: NIV, Vest percussion DME Agency: [employer] Date DME Agency Contacted: 12/14/18 Time DME Agency Contacted: 0900 Representative spoke with at DME Agency: [place]: RN, PT [place] Agency: [place] ([place]) Date [place]: 12/14/18 Time [place]: [place] Representative spoke with at [place]: [place] ([place]) Interventions    Readmission Risk Interventions Readmission Risk Prevention Plan  12/07/2018 11/30/2018 11/09/2018  Transportation Screening Complete Complete Complete  PCP or Specialist Appt within 3-5 Days Complete - Complete  HRI or Home Care Consult Complete - Complete  Social Work Consult for [place] Planning/Counseling Not Complete - Not Complete  SW consult not completed comments NA - na  Palliative Care Screening Not Applicable - Not Applicable  Medication Review Press photographer) Complete Complete Complete  PCP or Specialist appointment within 3-5 days of discharge - Complete -  [place] or [place] - Complete -  SW Recovery Care/Counseling Consult - Not Complete -  SW Consult Not Complete Comments - na -  Palliative Care Screening - Not Applicable -  [place] - Complete -  Some recent data might be hidden

## 2018-12-14 NOTE — Progress Notes (Signed)
PT Cancellation Note  Patient Details Name: [name] MRN: [id number] DOB: 30-Aug-1934   Cancelled Treatment:    Reason Eval/Treat Not Completed: Patient declined, no reason specified;Other (comment)(Patient treatment session attempted. Patient in chair eating breakfast upon attempt. Patient declined PT at this time, wants to eat breakfast and not exercise during or right after. Will attempt again at later time/date.)  Janna Arch, PT, DPT   12/14/2018, 9:21 AM

## 2018-12-14 NOTE — Progress Notes (Addendum)
[facility] NAME: Marli Diego    MR#:  [id number]  DATE OF BIRTH:  28-[name]-1935  SUBJECTIVE:   Patient is out of bed to chair. Tired and anxious.  Awaiting for dressing changes in the afternoon, no overnight incidents.  Daughter [name] at bedside   Her lower extremity edema has been improving.  Patient is uncomfortable to wear SCD on right lower extremity  REVIEW OF SYSTEMS:  Review of Systems  Constitutional: Positive for malaise/fatigue. Negative for chills, fever and weight loss.  HENT: Negative for congestion, hearing loss and sore throat.   Eyes: Positive for double vision. Negative for blurred vision.  Respiratory: Negative for cough, shortness of breath and wheezing.   Cardiovascular: Negative for chest pain, palpitations, orthopnea and leg swelling.  Gastrointestinal: Negative for abdominal pain, diarrhea, nausea and vomiting.  Genitourinary: Negative for dysuria and urgency.  Musculoskeletal: Negative for myalgias.  Skin: Negative for rash.  Neurological: Negative for dizziness, sensory change, speech change, focal weakness and headaches.  Psychiatric/Behavioral: Negative for depression.   DRUG ALLERGIES:   Allergies  Allergen Reactions  . Ace Inhibitors     Cough  . Doxycycline Nausea Only  . Hydrocodone Itching and Nausea Only  . Mercury     Other reaction(s): Unknown  . Silver Dermatitis    Severe itching  . Cephalexin Rash  . Clindamycin/Lincomycin Rash  . Nickel Rash  . Penicillins Rash    Has patient had a PCN reaction causing immediate rash, facial/tongue/throat swelling, SOB or lightheadedness with hypotension: YES Has patient had a PCN reaction causing severe rash involving mucus membranes or skin necrosis: NO Has patient had a PCN reaction that required hospitalization NO Has patient had a PCN reaction occurring within the last 10 years: NO If all of the above answers are "NO", then may proceed  with Cephalosporin use.   VITALS:  Blood pressure 114/62, pulse 74, temperature 98 F (36.7 C), temperature source Oral, resp. rate 19, height 4\' 11"  (1.499 m), weight 64.5 kg, SpO2 98 %. PHYSICAL EXAMINATION:   GENERAL:  84 y.o.-year-old patient lying in the bed with no acute distress.  EYES: Pupils equal, round, reactive to light and accommodation. No scleral icterus. Extraocular muscles intact.  HEENT: Head atraumatic, normocephalic. Oropharynx and nasopharynx clear.  NECK:  Supple, no jugular venous distention. No thyroid enlargement, no tenderness.  LUNGS: + Diminished breath sounds in the lung bases bilaterally. No use of accessory muscles of respiration.  CARDIOVASCULAR: RRR, S1, S2 normal. +loud systolic murmur, no rubs, or gallops.  ABDOMEN: Soft, nontender, nondistended. Bowel sounds present.  MUSCULOSKELETAL: No LE edema.  Ace wrap to bilateral legs, no cyanosis, or clubbing.  NEUROLOGIC:Alert and oriented x 3. CN 2-12 intact. Sensation intact to light touch. Gait not tested. PSYCHIATRIC: The patient is alert and oriented x 3.  SKIN: dry ACE bandages over bilateral legs.           DATA REVIEWED:  LABORATORY PANEL:  Female CBC Recent Labs  Lab 12/14/18 0527  WBC 5.5  HGB 8.5*  HCT 29.1*  PLT 163   ------------------------------------------------------------------------------------------------------------------ Chemistries  Recent Labs  Lab 12/14/18 0527  NA 141  K 4.0  CL 99  CO2 34*  GLUCOSE 93  BUN 25*  CREATININE 1.45*  CALCIUM 9.2   RADIOLOGY:  No results found. ASSESSMENT AND PLAN:   Acute on chronic combined systolic and diastolic CHF- resolved. -Continue low dose coreg -Will hold losartan today due  to low BPs -Holding Lasix today in view of worsening of the renal function and will resume Lasix 40 mg twice a day in a.m. if creatinine is stable -Daily weights, strict I/O -Cardiology following, discussed with Dr. Fletcher Anon -We will encourage  the patient to use customized TED hose on right lower leg after son brings it from home  Paroxysmal atrial fibrillation + Mechanical MVR- AV paced. -Continue coumadin and amiodarone -INR at 3.0.  Goal INR 2.5-3.0 per cardiology -Considering Coumadin 4 mg Monday through Friday and 2 mg on Saturday and Sunday  Generalized anxiety disorder Lexapro is not considered in view of prolonged QT's Discussed with daughter not considering  psychiatry consult at this point of time.  Not considering SSRIs Atarax as needed  Chronic respiratory failure secondary to COPD-  no signs of exacerbation. Currently on home 3L O2. -Duonebs prn -Pulmonology is following.  Appreciate the recommendations  Left lower extremity wound status post debridement of large hematoma -Dr. Bary Castilla following The area was gently debrided with a saline dampened sponge followed by application of a Sorbact dressing base, vaseline soaked gauze, fluff gauze and a light ace wrap. Left lower extremity wound care: The daily dressings should be to remove the 4x4 gauze, rinse with saline, apply KY jelly to SATURATE the new gauze, apply to the wound (leaving the green, Sorbact dressing in place) followed by Kerlex and a light ace wrap.   Elevated troponin- likely demand ischemia. Troponins flat.   Hypertension- BPs have been soft -Continue low dose coreg -Holding losartan for now   CKD III -baseline creatinine seems to be at 1.3-1.5 -Continue to monitor and avoid nephrotoxins Nephrology is following discussed with Dr. Juleen China.  Holding Lasix for today and monitor renal function in a.m. if stable will resume Lasix 40 mg twice daily Creatinine at 1.45 today  DVT prophylaxis- coumadin.  Ambulating in the hallway with nurses assistance  Plan for discharge on Tuesday 6/16 to home health PT.  Discussed with the daughter she is agreeable  All the records are reviewed and case discussed with Care Management/Social Worker. Management plans  discussed with the patient, daughter [name] at bedside and they are in agreement.  CODE STATUS: Full Code  TOTAL TIME TAKING CARE OF THIS PATIENT: 13minutes.   More than 50% of the time was spent in counseling/coordination of care: YES  POSSIBLE D/C IN 1-2  DAYS, DEPENDING ON CLINICAL CONDITION.   on 12/14/2018 at 3:19 PM  Between 7am to 6pm - Pager - [phone]   After 6pm go to [email or web address] - Proofreader  [facility]  Office  [phone]  CC: Primary care physician; Rusty Aus, MD  Note: This dictation was prepared with Dragon dictation along with smaller phrase technology. Any transcriptional errors that result from this process are unintentional.

## 2018-12-14 NOTE — Plan of Care (Signed)

## 2018-12-14 NOTE — Evaluation (Signed)
Clinical/Bedside Swallow Evaluation Patient Details  Name: [name] MRN: [id number] Date of Birth: 1935/05/02  Today's Date: 12/14/2018 Time: SLP Start Time (ACUTE ONLY): 84 SLP Stop Time (ACUTE ONLY): 1055 SLP Time Calculation (min) (ACUTE ONLY): 50 min  Past Medical History:  Past Medical History:  Diagnosis Date  . Arthritis    "back, hands, knees" (04/10/2016)  . Chronic back pain   . 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 EF 25% by echo in 08/2015 b. RHC in 08/2015 showed normal filling pressures; c. 10/2018 Echo (in setting of atrial tachycardia): EF 25-30%, impaired relaxation. Glob HK. RVSP 27mmHg. Mod dil RA/LA. Mech MV w/ nl fxn (mean grad 24mmHg). Mod TR.   Marland Kitchen Chronic kidney disease   . Compression fracture    "several; all in my back" (04/10/2016)  . GERD (gastroesophageal reflux disease)   . Hypertension   . Lymphedema   . Pacemaker 05/2017  . PAF (paroxysmal atrial fibrillation) ([place])    a. On amio & Coumadin - CHA2DS2VASc = 4.  . S/P MVR (mitral valve replacement)    a. MVR 1994 b. redo MVR in 08/2014 - on Coumadin  . Shortness of breath dyspnea    Past Surgical History:  Past Surgical History:  Procedure Laterality Date  . BREAST LUMPECTOMY Right 2005?   benign lump excision  . CARDIAC CATHETERIZATION    . [place], 2016   "MVR; MVR"  . CARDIOVERSION N/A 11/06/2018   Procedure: CARDIOVERSION;  Surgeon: Minna Merritts, MD;  Location: [facility];  Service: Cardiovascular;  Laterality: N/A;  . CATARACT EXTRACTION W/PHACO Right 02/20/2015   Procedure: CATARACT EXTRACTION PHACO AND INTRAOCULAR LENS PLACEMENT ([place]);  Surgeon: Estill Cotta, MD;  Location: [facility];  Service: Ophthalmology;  Laterality: Right;  US01:31.2 AP 25.3% CDE40.13 Fluid lot # [id number]  . DRESSING CHANGE UNDER ANESTHESIA Left 11/17/2018   Procedure: DRESSING CHANGE;  Surgeon: Robert Bellow, MD;   Location: [facility];  Service: General;  Laterality: Left;  no anesthesia  . ELECTROPHYSIOLOGIC STUDY N/A 10/09/2015   Procedure: CARDIOVERSION;  Surgeon: Wellington Hampshire, MD;  Location: [facility];  Service: Cardiovascular;  Laterality: N/A;  . INCISION AND DRAINAGE OF WOUND Left 11/13/2018   Procedure: LEFT LEG DRESSING CHANGE;  Surgeon: Robert Bellow, MD;  Location: [facility];  Service: General;  Laterality: Left;  . INCISION AND DRAINAGE OF WOUND Left 11/19/2018   Procedure: LEFT LEG DRESSING CHANGE AND GRAFT APPLICATION;  Surgeon: Robert Bellow, MD;  Location: [facility];  Service: General;  Laterality: Left;  . IR GENERIC HISTORICAL  04/01/2016   IR RADIOLOGIST EVAL & MGMT 04/01/2016 [facility]  . IR GENERIC HISTORICAL  04/15/2016   IR VERTEBROPLASTY CERV/THOR BX INC UNI/BIL INC/INJECT/IMAGING 04/15/2016 Luanne Bras, MD [facility]  . IR GENERIC HISTORICAL  04/15/2016   IR VERTEBROPLASTY EA ADDL (T&LS) BX INC UNI/BIL INC INJECT/IMAGING 04/15/2016 Luanne Bras, MD [facility]  . IR GENERIC HISTORICAL  04/15/2016   IR VERTEBROPLASTY EA ADDL (T&LS) BX INC UNI/BIL INC INJECT/IMAGING 04/15/2016 Luanne Bras, MD [facility]  . IR GENERIC HISTORICAL  05/20/2016   IR RADIOLOGIST EVAL & MGMT 05/20/2016 [facility]  . IR GENERIC HISTORICAL  06/13/2016   IR VERTEBROPLASTY EA ADDL (T&LS) BX INC UNI/BIL INC INJECT/IMAGING 06/13/2016 Luanne Bras, MD [facility]  . IR GENERIC HISTORICAL  06/13/2016   IR SACROPLASTY BILATERAL 06/13/2016 Luanne Bras, MD [facility]  . IR  GENERIC HISTORICAL  06/13/2016   IR VERTEBROPLASTY LUMBAR BX INC UNI/BIL INC/INJECT/IMAGING 06/13/2016 Luanne Bras, MD [facility]  . IRRIGATION AND DEBRIDEMENT HEMATOMA Left 07/05/2015   Procedure: IRRIGATION AND DEBRIDEMENT HEMATOMA;  Surgeon: Robert Bellow, MD;  Location: [facility];  Service: General;  Laterality: Left;  . IRRIGATION AND DEBRIDEMENT HEMATOMA Left 11/11/2018    Procedure: IRRIGATION AND DEBRIDEMENT LEFT LEG HEMATOMA;  Surgeon: Robert Bellow, MD;  Location: [facility];  Service: General;  Laterality: Left;  . pyloric stenosis  07/1935  . US ECHOCARDIOGRAPHY     HPI:  Pt is a 84 y.o. female with a known history of GERD w/ Esophageal phase dysmotility (pt even reported a surgery for pyloric stenosis "as a child"), atrial fibrillation, combined CHF, nonischemic cardiomyopathy, hypertension, chronic lower extremity edema, mitral valve disease status post mechanical valve replacement and currently on anticoagulation therapy with Coumadin.  Patient also has a history of CKD.  She was referred for direct admission from her cardiologist, Dr. Rockey Situ, office for acute exacerbation of combined CHF.  Ejection fraction on echocardiogram completed 11/05/2018 was 25 to 30% which was decreased from 40% in December 2019.  Patient has been admitted twice over the last month.  Initial admission was related to exacerbation of CHF with chronic renal failure as well as an episode of atrial tachycardia.  She underwent cardioversion on May 8.  After patient was discharged home, she returned with large spontaneous hematoma to her left calf which ultimately required debridement and biological graft placement which occurred on May 13.  Patient was then discharged to rehabilitation center where she has been up to this point.  She now returns to be admitted from the cardiologist office for exacerbation of her CHF with increased edema of her lower extremities as well as increased shortness of breath with minimal exertion and at rest when lying flat.  According to documentation, Lasix has already been decreased to 40 mg twice daily which is decreased from 80 mg twice daily. CXR revealed mild Pulmonary Edema.  Per NSG report, pt has been tolerating po meals well; pt requests Pills in Puree for easier swallowing but continues to be bothered by swallowing the Large Potassium pill(the other pills are  being given in a puree and tolerated well).    Assessment / Plan / Recommendation Clinical Impression  Pt appears to present w/ adequate oropharyngeal phase swallow function w/ no overt s/s of aspiration noted during these trials at session. Pt appears at reduced risk for aspriation when following general precautions. Pt is also recommended to follow REFLUX precautions d/t h/o Esophageal phase dysmotility as any retrograde activity of food/liquid/material can lead to aspiration of such thus Pulmonary decline.  During trials, pt consumed thin liquids, ice chips, and soft solids w/ no overt coughing or throat clearing; NO decline in vocal quality or respiratory status during/post trials. Oral phase appeared [facility] for bolus management during drinking from Cup(preferred) and during bolus management including mastication, A-P transfer, and oral clearing. Encouraged pt to Alternate w/ small sips of liquid to aid Esophageal clearing. Pt fed self w/ min setup. OM exam was [facility] w/ no unilateral weakness noted.  Recommend continue current diet w/ meats cut/moistened; thin liquids. Recommend general aspiration precautions; Strict REFLUX Precautions. Recommend Pills in PUREE for easier swallowing - explore liquid, chewable, or powder forms as well if desired.  As pt has a BASELINE h/o Esophageal phase dysmotility (MBSS in 2018 noted), recommend f/u w/ GI for formal assessment and education; recommend  follow REFLUX precautions and lessen meats/breads in diet; moistened SMALL bites of foods well; frequent mini meals; Dietician f/u for support and education also.  No further skilled ST services indicated at this time as pt appears at her baseline re: orohparyngeal phase swallowing.  SLP Visit Diagnosis: Dysphagia, unspecified (R13.10)(Esophageal phase dysmotility)    Aspiration Risk  (reduced following general precautions)    Diet Recommendation  continue current regular diet w/ Meats Cut and Moistened; all foods less bulky  and more moist (less meats/breads in diet); Thin liquids.  General aspiration precautions;  STRICT REFLUX precautions  Medication Administration: Whole meds with puree(vs Crushed OR in liquid, powder, chewable forms)    Other  Recommendations Recommended Consults: Consider esophageal assessment(baseline dysmotility and GERD) Oral Care Recommendations: Oral care BID;Patient independent with oral care Other Recommendations: (n/a)   Follow up Recommendations None      Frequency and Duration (n/a)  (n/a)       Prognosis Prognosis for Safe Diet Advancement: Good Barriers to Reach Goals: Time post onset;Severity of deficits      Swallow Study   General Date of Onset: 12/04/18 HPI: Pt is a 85 y.o. female with a known history of GERD w/ Esophageal phase dysmotility (pt even reported a surgery for pyloric stenosis "as a child"), atrial fibrillation, combined CHF, nonischemic cardiomyopathy, hypertension, chronic lower extremity edema, mitral valve disease status post mechanical valve replacement and currently on anticoagulation therapy with Coumadin.  Patient also has a history of CKD.  She was referred for direct admission from her cardiologist, Dr. Rockey Situ, office for acute exacerbation of combined CHF.  Ejection fraction on echocardiogram completed 11/05/2018 was 25 to 30% which was decreased from 40% in December 2019.  Patient has been admitted twice over the last month.  Initial admission was related to exacerbation of CHF with chronic renal failure as well as an episode of atrial tachycardia.  She underwent cardioversion on May 8.  After patient was discharged home, she returned with large spontaneous hematoma to her left calf which ultimately required debridement and biological graft placement which occurred on May 13.  Patient was then discharged to rehabilitation center where she has been up to this point.  She now returns to be admitted from the cardiologist office for exacerbation of her CHF  with increased edema of her lower extremities as well as increased shortness of breath with minimal exertion and at rest when lying flat.  According to documentation, Lasix has already been decreased to 40 mg twice daily which is decreased from 80 mg twice daily. CXR revealed mild Pulmonary Edema.  Per NSG report, pt has been tolerating po meals well; pt requests Pills in Puree for easier swallowing but continues to be bothered by swallowing the Large Potassium pill(the other pills are being given in a puree and tolerated well).  Type of Study: Bedside Swallow Evaluation Previous Swallow Assessment: MBSS in 2018 in which no gross oropharyngeal phase dysphagia was noted, HOWEVER, Esophageal phase dysmotility was noted and pt was recommended to f/u w/ GI (no GI f/u noted in chart notes or per pt report). Diet Prior to this Study: Regular;Thin liquids Temperature Spikes Noted: No(wbc 5.5) Respiratory Status: Nasal cannula(3 liters) History of Recent Intubation: No Behavior/Cognition: Alert;Cooperative;Pleasant mood(min HOH) Oral Cavity Assessment: Within Functional Limits Oral Care Completed by SLP: Recent completion by staff Oral Cavity - Dentition: Adequate natural dentition Vision: Functional for self-feeding Self-Feeding Abilities: Able to feed self;Needs set up(min) Patient Positioning: Upright in chair Baseline Vocal Quality:  Normal Volitional Cough: Strong Volitional Swallow: Able to elicit    Oral/Motor/Sensory Function Overall Oral Motor/Sensory Function: Within functional limits(w/ individual movements and bolus management)   Ice Chips Ice chips: Within functional limits Presentation: Cup;Self Fed(3-4 trials)   Thin Liquid Thin Liquid: Within functional limits Presentation: Cup;Self Fed(~8-10 swallows/trials) Other Comments: pt stated she "liked to chew ice"    Nectar Thick Nectar Thick Liquid: Not tested   Honey Thick Honey Thick Liquid: Not tested   Puree Puree: Within functional  limits   Solid     Solid: Within functional limits Presentation: Self Fed(4 trials) Other Comments: graham crackers w/ ice cream       Orinda Kenner, MS, CCC-SLP , 12/14/2018,11:53 AM

## 2018-12-14 NOTE — Plan of Care (Signed)

## 2018-12-14 NOTE — Progress Notes (Signed)
The patient was seen today for planned dressing change.  She did not require premedication.  The previously placed Vaseline soaked gauze was removed as well as the Mepitel dressing.  In some areas the ACell graft material is fully incorporated and only a thin rim of dried membrane remains which was carefully removed.  There are islands of healthy epidermis evident throughout a majority of the wound.  Photographs are noted below.  The area was gently debrided with a saline dampened sponge followed by application of a Sorbact dressing base, vaseline soaked gauze, fluff gauze and a light ace wrap.  The daily dressings should be to remove the 4x4 gauze, rinse with saline, apply KY jelly to SATURATE the new gauze, apply to the wound (leaving the green, Sorbact dressing in place) followed by Kerlex and a light ace wrap.

## 2018-12-14 NOTE — Consult Note (Signed)
Consultation Note Date: 12/14/2018   Patient Name: [name]  DOB: [birth year]  MRN: [id number]  Age / Sex: 84 y.o., female  PCP: Rusty Aus, MD Referring Physician: Nicholes Mango, MD  Reason for Consultation: Establishing goals of care  HPI/Patient Profile: 84 y.o. female  with past medical history of acute on chronic combined systolic and diastolic heart failure, A. fib with MVR-AV pacer, , chronic respiratory failure secondary to COPD, chronic left lower wound, anxiety, hypertension, stage III kidney disease, GERD, compression fractures of her back, admitted on 12/04/2018 with acute on chronic heart failure - resolved, A. Fib with mechanical MVR, chronic left lower extremity wound.   Clinical Assessment and Goals of Care: I have reviewed medical records including EPIC notes, labs and imaging, received report from [facility] and attending physician, and then met with [name] at the bedside to discuss diagnosis, GOC, disposition and options.  [name] is sitting on the potty chair in the room.  She tells me that she is finished and is ready to get back in bed.  Bedside tech and I assist her to the bed, positioning her for comfort.  [name] is alert and oriented, calm and not fearful.  There is no family at bedside at this time, [name] shares that her daughter [name] has gone to get some items.  I introduced Palliative Medicine as specialized medical care for people living with serious illness. It focuses on providing relief from the symptoms and stress of a serious illness. The goal is to improve quality of life for both the patient and the family.  We discussed a brief life review of the patient.  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  She tells me that she has 2 children [place] and [place].  Her husband passed away ?Sep 04, 2006.  [name] tells me that she has been at "[facility]"  for quite some time, but she would like to return to her own home.  She asks, "do you think I will make it?"  I share that I am sure she will.  We discussed her current illness and what it means in the larger context of her on-going co-morbidities.  We talked in some detail about her struggle with her chronic left lower leg wound.  She tells me that the doctor has just coming in, redone her dressing and she is very tired.  I share that she has really struggled with this wound.    [name] asked about and we again briefly talk about palliative medicine, how it is patient focused.  [name] asks, "Am I chronic?",  I share that she does have chronic illnesses such as heart failure and COPD.  At this point she states that she would like to return home, and independently states that her preferred place of death is home.  I share that it is important that we hear and respect her voice so we can support her in the way that feels and looks right to her.  I reassured her, encouraging her to not worry.  She shares that she will not.  We plan for follow-up tomorrow  Questions and concerns were addressed.  We plan for follow-up meeting tomorrow.    HCPOA   NEXT OF KIN - [name] tells me that her daughter Delorse Lek is her healthcare surrogate.  She shares that she also has a son Helene Kelp.    SUMMARY OF RECOMMENDATIONS   Continue to treat the treatable Home with home health  Code Status/Advance Care Planning:  Full code -  By default.  Code status not discussed with [name] today as there is no family present at this time.   Symptom Management:   Per hospitalist, no additional needs at this time.  Palliative Prophylaxis:   Frequent Pain Assessment and Turn Reposition  Additional Recommendations (Limitations, Scope, Preferences):  Full Scope Treatment  Psycho-social/Spiritual:   Desire for further Chaplaincy support: Not discussed today  Additional Recommendations: Caregiving   Support/Resources  Prognosis:   Unable to determine, based on outcomes.  Discharge Planning: Home with Home Health      Primary Diagnoses: Present on Admission: . Pressure injury of skin   I have reviewed the medical record, interviewed the patient and family, and examined the patient. The following aspects are pertinent.  Past Medical History:  Diagnosis Date  . Arthritis    "back, hands, knees" (04/10/2016)  . Chronic back pain   . 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 EF 25% by echo in 08/2015 b. RHC in 08/2015 showed normal filling pressures; c. 10/2018 Echo (in setting of atrial tachycardia): EF 25-30%, impaired relaxation. Glob HK. RVSP 10mHg. Mod dil RA/LA. Mech MV w/ nl fxn (mean grad 559mg). Mod TR.   . Marland Kitchenhronic kidney disease   . Compression fracture    "several; all in my back" (04/10/2016)  . GERD (gastroesophageal reflux disease)   . Hypertension   . Lymphedema   . Pacemaker 05/2017  . PAF ([place]mal [place] fibrillation) ([place]   a. On amio & Coumadin - CHA2DS2VASc = 4.  . S/P MVR (mitral valve replacement)    a. MVR 1994 b. redo MVR in 08/2014 - on Coumadin  . Shortness of breath dyspnea    Social History   Socioeconomic History  . Marital status: Widowed    Spouse name: Not on file  . Number of children: Not on file  . Years of education: Not on file  . Highest education level: Not on file  Occupational History  . Not on file  Social Needs  . Financial resource strain: Not on file  . Food insecurity    Worry: Not on file    Inability: Not on file  . Transportation needs    Medical: Not on file    Non-medical: Not on file  Tobacco Use  . Smoking status: Former Smoker    Packs/day: 1.00    Years: 27.00    Pack years: 27.00    Types: Cigarettes    Quit date: 07/01/1980    Years since quitting: 38.4  . Smokeless tobacco: Never Used  . Tobacco comment: "quit smoking ~ 1980  Substance and  Sexual Activity  . Alcohol use: Not Currently    Alcohol/week: 0.0 standard drinks    Comment: 04/10/2016 "I'll have a drink on holidays/special occasions"  . Drug use: No  . Sexual activity: Never  Lifestyle  . Physical activity    Days per week:  Not on file    Minutes per session: Not on file  . Stress: Not on file  Relationships  . Social Herbalist on phone: Not on file    Gets together: Not on file    Attends religious service: Not on file    Active member of club or organization: Not on file    Attends meetings of clubs or organizations: Not on file    Relationship status: Not on file  Other Topics Concern  . Not on file  Social History Narrative  . Not on file   Family History  Problem Relation Age of Onset  . Stroke Mother   . Renal Disease Father    Scheduled Meds: . amiodarone  200 mg Oral Daily  . carvedilol  3.125 mg Oral BID WC  . cholecalciferol  2,000 Units Oral Daily  . feeding supplement (ENSURE ENLIVE)  237 mL Oral BID BM  . furosemide  20 mg Oral Daily  . furosemide  40 mg Oral BID  . losartan  12.5 mg Oral Daily  . polyethylene glycol  17 g Oral Daily  . potassium chloride SA  20 mEq Oral Daily  . sodium chloride flush  3 mL Intravenous Q12H  . vitamin C  500 mg Oral Daily  . warfarin  2 mg Oral ONCE-1800  . Warfarin - Pharmacist Dosing Inpatient   Does not apply q1800  . zinc sulfate  220 mg Oral Daily   Continuous Infusions: . sodium chloride     PRN Meds:.sodium chloride, acetaminophen, albuterol, hydrOXYzine, ipratropium-albuterol, ondansetron (ZOFRAN) IV, oxyCODONE, sodium chloride flush Medications Prior to Admission:  Prior to Admission medications   Medication Sig Start Date End Date Taking? Authorizing Provider  acetaminophen (TYLENOL) 650 MG CR tablet Take 1 tablet (650 mg total) by mouth every 6 (six) hours as needed for pain (mild and moderate pain). 11/30/18  Yes Gladstone Lighter, MD  albuterol (PROVENTIL) (2.5 MG/3ML)  0.083% nebulizer solution Take 3 mLs (2.5 mg total) by nebulization every 6 (six) hours as needed for wheezing or shortness of breath. 11/30/18  Yes Gladstone Lighter, MD  ALPRAZolam Duanne Moron) 0.25 MG tablet Take 1 tablet (0.25 mg total) by mouth at bedtime as needed for sleep. 11/30/18  Yes Gladstone Lighter, MD  amiodarone (PACERONE) 200 MG tablet Take 1 tablet (200 mg total) by mouth daily. 09/24/18  Yes Minna Merritts, MD  Cholecalciferol (VITAMIN D) 2000 units tablet Take 2,000 Units by mouth daily.   Yes [provider]  furosemide (LASIX) 40 MG tablet Take 1 tablet (40 mg total) by mouth 2 (two) times daily. 11/30/18  Yes Gladstone Lighter, MD  losartan (COZAAR) 25 MG tablet Take 25 mg by mouth daily.   Yes [provider]  Menthol (ICY HOT) 5 % PTCH Apply 1 patch topically daily as needed (pain).   Yes [provider]  oxyCODONE (OXY IR/ROXICODONE) 5 MG immediate release tablet Take 0.5 tablets (2.5 mg total) by mouth every 6 (six) hours as needed for severe pain. 11/30/18  Yes Gladstone Lighter, MD  polyethylene glycol (MIRALAX / GLYCOLAX) 17 g packet Take 17 g by mouth daily. 11/30/18  Yes Gladstone Lighter, MD  potassium chloride SA (K-DUR) 20 MEQ tablet Take 1 tablet (20 mEq total) by mouth daily. 11/30/18  Yes Gladstone Lighter, MD  Probiotic Product (RISA-BID PROBIOTIC PO) Take 1 capsule by mouth daily.    Yes [provider]  vitamin C (ASCORBIC ACID) 500 MG tablet Take  500 mg by mouth daily.   Yes [provider]  warfarin (COUMADIN) 4 MG tablet Take 1 tablet (4 mg total) by mouth one time only at 6 PM. 11/30/18  Yes Gladstone Lighter, MD  zinc sulfate 220 (50 Zn) MG capsule Take 1 capsule (220 mg total) by mouth daily. 12/01/18  Yes Gladstone Lighter, MD   Allergies  Allergen Reactions  . Ace Inhibitors     Cough  . Doxycycline Nausea Only  . Hydrocodone Itching and Nausea Only  . Mercury     Other reaction(s): Unknown  . Silver Dermatitis     Severe itching  . Cephalexin Rash  . Clindamycin/Lincomycin Rash  . Nickel Rash  . Penicillins Rash    Has patient had a PCN reaction causing immediate rash, facial/tongue/throat swelling, SOB or lightheadedness with hypotension: YES Has patient had a PCN reaction causing severe rash involving mucus membranes or skin necrosis: NO Has patient had a PCN reaction that required hospitalization NO Has patient had a PCN reaction occurring within the last 10 years: NO If all of the above answers are "NO", then may proceed with Cephalosporin use.   Review of Systems  Unable to perform ROS: Age    Physical Exam Vitals signs and nursing note reviewed.  Constitutional:      General: She is not in acute distress.    Appearance: She is ill-appearing.     Comments: Makes and mostly keeps eye contact.   Pulmonary:     Effort: Pulmonary effort is normal. No respiratory distress.  Abdominal:     General: Abdomen is flat.     Tenderness: There is no abdominal tenderness.  Musculoskeletal:     Comments: Left lower leg with dressing CDI   Skin:    General: Skin is warm and dry.  Neurological:     Mental Status: She is alert and oriented to person, place, and time. Mental status is at baseline.  Psychiatric:     Comments: Calm and cooperative, not fearful.      Vital Signs: BP 114/62 (BP Location: Left Arm)   Pulse 74   Temp 98 F (36.7 C) (Oral)   Resp 19   Ht 4' 11"  (1.499 m)   Wt 64.5 kg   SpO2 98%   BMI 28.72 kg/m  Pain Scale: 0-10 POSS *See Group Information*: 1-Acceptable,Awake and alert Pain Score: 0-No pain   SpO2: SpO2: 98 % O2 Device:SpO2: 98 % O2 Flow Rate: .O2 Flow Rate (L/min): 3 L/min  IO: Intake/output summary:   Intake/Output Summary (Last 24 hours) at 12/14/2018 0959 Last data filed at 12/14/2018 9024 Gross per 24 hour  Intake 240 ml  Output 500 ml  Net -260 ml    LBM: Last BM Date: 12/14/18 Baseline Weight: Weight: 66.5 kg Most recent weight: Weight:  64.5 kg     Palliative Assessment/Data:   Flowsheet Rows     Most Recent Value  Intake Tab  Referral Department  Hospitalist  Unit at Time of Referral  Cardiac/Telemetry Unit  Palliative Care Primary Diagnosis  Cardiac  Date Notified  12/11/18  Palliative Care Type  New Palliative care  Reason for referral  Clarify Goals of Care  Date of Admission  12/04/18  Date first seen by Palliative Care  12/14/18  # of days Palliative referral response time  3 Day(s)  # of days IP prior to Palliative referral  7  Clinical Assessment  Palliative Performance Scale Score  40%  Pain Max last 24  hours  Not able to report  Pain Min Last 24 hours  Not able to report  Dyspnea Max Last 24 Hours  Not able to report  Dyspnea Min Last 24 hours  Not able to report  Psychosocial & Spiritual Assessment  Palliative Care Outcomes      Time In: 1500 Time Out: 1555 Time Total: 55 minutes  Greater than 50%  of this time was spent counseling and coordinating care related to the above assessment and plan.  Signed by: Drue Novel, NP   Please contact Palliative Medicine Team phone at [phone] for questions and concerns.  For individual provider: See Shea Evans

## 2018-12-14 NOTE — Progress Notes (Signed)
Cc; follow pulm visit  HPI: This is a pleasant 84 year old lady who is here with multiple problems, weel outlined. Pulmonary seeing for shortness of breath, better, in the setting of copd and improving chf. Agree with Dr. Valetta Mole assesment.    Past Medical History:  Diagnosis Date  . Arthritis    "back, hands, knees" (04/10/2016)  . Chronic back pain   . 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 EF 25% by echo in 08/2015 b. RHC in 08/2015 showed normal filling pressures; c. 10/2018 Echo (in setting of atrial tachycardia): EF 25-30%, impaired relaxation. Glob HK. RVSP 54mmHg. Mod dil RA/LA. Mech MV w/ nl fxn (mean grad 10mmHg). Mod TR.   Marland Kitchen Chronic kidney disease   . Compression fracture    "several; all in my back" (04/10/2016)  . GERD (gastroesophageal reflux disease)   . Hypertension   . Lymphedema   . Pacemaker 05/2017  . PAF (paroxysmal atrial fibrillation) ([place])    a. On amio & Coumadin - CHA2DS2VASc = 4.  . S/P MVR (mitral valve replacement)    a. MVR 1994 b. redo MVR in 08/2014 - on Coumadin  . Shortness of breath dyspnea      SURGICAL HISTORY        Past Surgical History:  Procedure Laterality Date  . BREAST LUMPECTOMY Right 2005?   benign lump excision  . CARDIAC CATHETERIZATION    . [place], 2016   "MVR; MVR"  . CARDIOVERSION N/A 11/06/2018   Procedure: CARDIOVERSION;  Surgeon: Minna Merritts, MD;  Location: [facility] ORS;  Service: Cardiovascular;  Laterality: N/A;  . CATARACT EXTRACTION W/PHACO Right 02/20/2015   Procedure: CATARACT EXTRACTION PHACO AND INTRAOCULAR LENS PLACEMENT ([place]);  Surgeon: Estill Cotta, MD;  Location: [facility] OR[facility];  Service: Ophthalmology;  Laterality: Right;  US01:31.2 AP 25.3% CDE40.13 Fluid lot # [id number]  . DRESSING CHANGE UNDER ANESTHESIA Left 11/17/2018   Procedure: DRESSING CHANGE;  Surgeon: Robert Bellow, MD;  Location: [facility];   Service: General;  Laterality: Left;  no anesthesia  . ELECTROPHYSIOLOGIC STUDY N/A 10/09/2015   Procedure: CARDIOVERSION;  Surgeon: Wellington Hampshire, MD;  Location: [facility];  Service: Cardiovascular;  Laterality: N/A;  . INCISION AND DRAINAGE OF WOUND Left 11/13/2018   Procedure: LEFT LEG DRESSING CHANGE;  Surgeon: Robert Bellow, MD;  Location: [facility];  Service: General;  Laterality: Left;  . INCISION AND DRAINAGE OF WOUND Left 11/19/2018   Procedure: LEFT LEG DRESSING CHANGE AND GRAFT APPLICATION;  Surgeon: Robert Bellow, MD;  Location: [facility];  Service: General;  Laterality: Left;  . IR GENERIC HISTORICAL  04/01/2016   IR RADIOLOGIST EVAL & MGMT 04/01/2016 [facility]  . IR GENERIC HISTORICAL  04/15/2016   IR VERTEBROPLASTY CERV/THOR BX INC UNI/BIL INC/INJECT/IMAGING 04/15/2016 Luanne Bras, MD [facility]  . IR GENERIC HISTORICAL  04/15/2016   IR VERTEBROPLASTY EA ADDL (T&LS) BX INC UNI/BIL INC INJECT/IMAGING 04/15/2016 Luanne Bras, MD [facility]  . IR GENERIC HISTORICAL  04/15/2016   IR VERTEBROPLASTY EA ADDL (T&LS) BX INC UNI/BIL INC INJECT/IMAGING 04/15/2016 Luanne Bras, MD [facility]  . IR GENERIC HISTORICAL  05/20/2016   IR RADIOLOGIST EVAL & MGMT 05/20/2016 [facility]  . IR GENERIC HISTORICAL  06/13/2016   IR VERTEBROPLASTY EA ADDL (T&LS) BX INC UNI/BIL INC INJECT/IMAGING 06/13/2016 Luanne Bras, MD [facility]  . IR GENERIC HISTORICAL  06/13/2016   IR SACROPLASTY BILATERAL 06/13/2016 Luanne Bras,  MD [facility]  . IR GENERIC HISTORICAL  06/13/2016   IR VERTEBROPLASTY LUMBAR BX INC UNI/BIL INC/INJECT/IMAGING 06/13/2016 Luanne Bras, MD [facility]  . IRRIGATION AND DEBRIDEMENT HEMATOMA Left 07/05/2015   Procedure: IRRIGATION AND DEBRIDEMENT HEMATOMA;  Surgeon: Robert Bellow, MD;  Location: [facility];  Service: General;  Laterality: Left;  . IRRIGATION AND DEBRIDEMENT HEMATOMA Left 11/11/2018    Procedure: IRRIGATION AND DEBRIDEMENT LEFT LEG HEMATOMA;  Surgeon: Robert Bellow, MD;  Location: [facility];  Service: General;  Laterality: Left;  . pyloric stenosis  07/1937  . US ECHOCARDIOGRAPHY       FAMILY HISTORY        Family History  Problem Relation Age of Onset  . Stroke Mother   . Renal Disease Father      SOCIAL HISTORY   Social History        Tobacco Use  . Smoking status: Former Smoker    Packs/day: 1.00    Years: 27.00    Pack years: 27.00    Types: Cigarettes    Quit date: 07/01/1980    Years since quitting: 38.4  . Smokeless tobacco: Never Used  . Tobacco comment: "quit smoking ~ 1980  Substance Use Topics  . Alcohol use: Not Currently    Alcohol/week: 0.0 standard drinks    Comment: 04/10/2016 "I'll have a drink on holidays/special occasions"  . Drug use: No     MEDICATIONS    Home Medication:    Current Medication:  Current Facility-Administered Medications:  .  0.9 %  sodium chloride infusion, 250 mL, Intravenous, PRN, Seals, Angela H, NP .  acetaminophen (TYLENOL) tablet 650 mg, 650 mg, Oral, Q4H PRN, Seals, Angela H, NP, 650 mg at 12/05/18 2127 .  albuterol (PROVENTIL) (2.5 MG/3ML) 0.083% nebulizer solution 2.5 mg, 2.5 mg, Nebulization, Q6H PRN, Seals, Angela H, NP, 2.5 mg at 12/11/18 0817 .  amiodarone (PACERONE) tablet 200 mg, 200 mg, Oral, Daily, Seals, Angela H, NP, 200 mg at 12/12/18 0931 .  carvedilol (COREG) tablet 3.125 mg, 3.125 mg, Oral, BID WC, Arida, Muhammad A, MD, 3.125 mg at 12/12/18 0931 .  cholecalciferol (VITAMIN D) tablet 2,000 Units, 2,000 Units, Oral, Daily, Seals, [name] Dills, NP, 2,000 Units at 12/12/18 0931 .  feeding supplement (ENSURE ENLIVE) (ENSURE ENLIVE) liquid 237 mL, 237 mL, Oral, BID BM, Mayo, Pete Pelt, MD, 237 mL at 12/07/18 1435 .  furosemide (LASIX) tablet 40 mg, 40 mg, Oral, BID, Visser, Jacquelyn D, PA-C, 40 mg at 12/12/18 0931 .  hydrOXYzine (ATARAX/VISTARIL) tablet 25  mg, 25 mg, Oral, TID PRN, Sela Hua, MD, 25 mg at 12/12/18 0136 .  ipratropium-albuterol (DUONEB) 0.5-2.5 (3) MG/3ML nebulizer solution 3 mL, 3 mL, Nebulization, Q6H PRN, Seals, Angela H, NP, 3 mL at 12/10/18 1347 .  losartan (COZAAR) tablet 12.5 mg, 12.5 mg, Oral, Daily, Fletcher Anon, Muhammad A, MD, 12.5 mg at 12/12/18 0931 .  ondansetron (ZOFRAN) injection 4 mg, 4 mg, Intravenous, Q6H PRN, Seals, Angela H, NP .  oxyCODONE (Oxy IR/ROXICODONE) immediate release tablet 2.5 mg, 2.5 mg, Oral, Q6H PRN, Seals, Angela H, NP, 2.5 mg at 12/08/18 1754 .  polyethylene glycol (MIRALAX / GLYCOLAX) packet 17 g, 17 g, Oral, Daily, Seals, Angela H, NP, 17 g at 12/09/18 0933 .  potassium chloride SA (K-DUR) CR tablet 20 mEq, 20 mEq, Oral, Daily, Seals, Angela H, NP, 20 mEq at 12/12/18 0931 .  sodium chloride flush (NS) 0.9 % injection 3 mL, 3 mL, Intravenous, Q12H, Seals, Wittmann  H, NP, 3 mL at 12/12/18 0935 .  sodium chloride flush (NS) 0.9 % injection 3 mL, 3 mL, Intravenous, PRN, Seals, Angela H, NP .  vitamin C (ASCORBIC ACID) tablet 500 mg, 500 mg, Oral, Daily, Seals, Angela H, NP, 500 mg at 12/12/18 0931 .  warfarin (COUMADIN) tablet 2 mg, 2 mg, Oral, ONCE-1800, Gouru, Aruna, MD .  Warfarin - Pharmacist Dosing Inpatient, , Does not apply, q1800, Mayo, Pete Pelt, MD .  zinc sulfate capsule 220 mg, 220 mg, Oral, Daily, Seals, Angela H, NP, 220 mg at 12/12/18 0931    ALLERGIES   Ace inhibitors, Doxycycline, Hydrocodone, Mercury, Silver, Cephalexin, Clindamycin/lincomycin, Nickel, and Penicillins     REVIEW OF SYSTEMS  NO NEW COMPLAINTS Less sob, mild cough, using her flutter valva and incentive spirometry No hemoptysis, pleurisy No chest pain, ectopy No abdomina; pain, n/v/d/c No new rashes or nodes. Daily lower ext wound care No new neuro sxs,     PHYSICAL EXAM Temp (F)    97.7-98.4 98  98 (36.7)  06/15 0825  Pulse Rate    60-64 74  74  06/15 0825  Resp    19-22 19  19   06/15 0825   BP    87/51-127/55 114/62  114/62  06/15 0825  SpO2 (%)    91-100 98       Gen: small frame lady, setting up in bed, able to speak in full sentences Neck supple, no stridor, jvd Chest: bilateral bs, a little coarse, decrease at the bases Cor: rrr, ++ systolic murmur ABD: Soft, non tender EXT: Legs ace wrapped, did not remove Neuro: alert, good insight, cranial nerves intact, sensation present angle to raise extremities against gravity  Impression:    Acute on chronic hypoxemic respiratory failure -Due to acute decompensated systolic CHF with EF 25 to 30%, improved -Cardiology following -Complicated by CKD and significant chronic anemia- following renal recs -agree with d/c plans -pulmonary wise will sign off

## 2018-12-14 NOTE — Care Management Important Message (Signed)
Important Message  Patient Details  Name: [name] MRN: [id number] Date of Birth: [birth year]   Medicare Important Message Given:  Yes    [name] 12/14/2018, 10:38 AM

## 2018-12-14 NOTE — Consult Note (Signed)
[facility] for warfarin dose management Indication: atrial fibrillation  Patient Measurements: Height: 4\' 11"  (149.9 cm) Weight: 142 lb 3.2 oz (64.5 kg) IBW/kg (Calculated) : 43.2  Vital Signs: Temp: 98 F (36.7 C) (06/15 0825) Temp Source: Oral (06/15 0825) BP: 114/62 (06/15 0825) Pulse Rate: 74 (06/15 0825)  Labs: Recent Labs    12/12/18 0506 12/13/18 0455 12/14/18 0527  HGB  --   --  8.5*  HCT  --   --  29.1*  PLT  --   --  163  LABPROT 31.6* 31.1* 31.0*  INR 3.1* 3.1* 3.0*  CREATININE 1.17*  --  1.45*    Estimated Creatinine Clearance: 24.8 mL/min (A) (by C-G formula based on SCr of 1.45 mg/dL (H)).   Medical History: Past Medical History:  Diagnosis Date  . Arthritis    "back, hands, knees" (04/10/2016)  . Chronic back pain   . 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 EF 25% by echo in 08/2015 b. RHC in 08/2015 showed normal filling pressures; c. 10/2018 Echo (in setting of atrial tachycardia): EF 25-30%, impaired relaxation. Glob HK. RVSP 75mmHg. Mod dil RA/LA. Mech MV w/ nl fxn (mean grad 60mmHg). Mod TR.   Marland Kitchen Chronic kidney disease   . Compression fracture    "several; all in my back" (04/10/2016)  . GERD (gastroesophageal reflux disease)   . Hypertension   . Lymphedema   . Pacemaker 05/2017  . PAF (paroxysmal atrial fibrillation) ([place])    a. On amio & Coumadin - CHA2DS2VASc = 4.  . S/P MVR (mitral valve replacement)    a. MVR 1994 b. redo MVR in 08/2014 - on Coumadin  . Shortness of breath dyspnea     Medications:  Scheduled:  . amiodarone  200 mg Oral Daily  . carvedilol  3.125 mg Oral BID WC  . cholecalciferol  2,000 Units Oral Daily  . feeding supplement (ENSURE ENLIVE)  237 mL Oral BID BM  . furosemide  20 mg Oral Daily  . furosemide  40 mg Oral BID  . losartan  12.5 mg Oral Daily  . polyethylene glycol  17 g Oral Daily  . potassium chloride SA  20 mEq  Oral Daily  . sodium chloride flush  3 mL Intravenous Q12H  . vitamin C  500 mg Oral Daily  . Warfarin - Pharmacist Dosing Inpatient   Does not apply q1800  . zinc sulfate  220 mg Oral Daily    Assessment: Pt was on warfarin previously for atrial fibrillation with mechanical mitral valve. On admission patient's INR was 3.1 (up to 3.6 today). Pt is on amiodarone which can increase INR. Pt was discharged from the hospital couple of weeks ago and warfarin dose was decreased ~10% from previous dose. Pt was discharged on a regimen of 4 mg daily. Her previous dose wasf warfarin 6 mg MWF and 3 mg TThSaSun.  Home warfarin dose: At home patient takes 4 mg daily- she is a resident at [facility] in [place]. The facility was called to confirm the dose changed from prior admission.   Today's INR is therapeutic.    --DDI with amiodarone (home med)   6/5 INR 3.1 6/6 INR 3.6  HELD 6/7 INR 3.0 (3 mg given) 6/8 INR 2.6 (4 mg given) 6/9 INR 2.5 (4 mg given) 6/10 INR 2.6 (4 mg given) 6/11 INR 2.5 (4 mg given) 6/12 INR 2.6 (4 mg given) 6/13 INR  3.1 (2 mg given) 6/14 INR 3.1 (2 mg given)  6/15 INR 3.0 (2 mg ordered)   Goal of Therapy:  INR 2-3 (per cardiology on previous visit d/t bleed during that admission, even with valve). Update: Discussed with Dr.  Saunders Revel INR goal. Will continue to keep goal of 2-3, but will favor an estimated range of 2.5-3.  Monitor platelets by anticoagulation protocol: Yes   Plan:  INR is at upper limit of goal range. Will order warfarin 2 mg x 1 and repeat INR in AM. May need to adjust regimen to 4 mg Monday through Friday, and 2 mg Saturday and Sunday for a ~ 10% decrease from current home dose.   Oswald Hillock, PharmD, BCPS 12/14/2018,9:24 AM

## 2018-12-14 NOTE — Progress Notes (Signed)
[facility]  ROUNDING NOTE   Subjective:   Patient in a great deal of pain due to wound care and dressing change  Furosemide given this morning.   Objective:  Vital signs in last 24 hours:  Temp:  [98 F (36.7 C)-98.4 F (36.9 C)] 98 F (36.7 C) (06/15 0825) Pulse Rate:  [61-74] 74 (06/15 0825) Resp:  [19-22] 19 (06/15 0825) BP: (87-127)/(51-62) 114/62 (06/15 0825) SpO2:  [91 %-98 %] 98 % (06/15 0825) Weight:  [64.5 kg] 64.5 kg (06/15 0116)  Weight change: -0.136 kg Filed Weights   12/11/18 0442 12/13/18 0249 12/14/18 0116  Weight: 64.4 kg 64.6 kg 64.5 kg    Intake/Output: I/O last 3 completed shifts: In: -  Out: 500 [Urine:500]   Intake/Output this shift:  Total I/O In: 240 [P.O.:240] Out: -   Physical Exam: General: No acute distress  Head: Normocephalic, atraumatic. Moist oral mucosal membranes  Eyes: Anicteric  Neck: Supple, trachea midline  Lungs:  Clear to auscultation   Heart: regular  Abdomen:  Soft, nontender, bowel sounds present  Extremities: 1+peripheral edema, LEs wrapped  Neurologic: Awake, alert, following commands  Skin: No lesions       Basic Metabolic Panel: Recent Labs  Lab 12/09/18 0422 12/10/18 0449 12/11/18 0501 12/12/18 0506 12/14/18 0527  NA 141 142 140 143 141  K 3.7 3.6 3.6 3.8 4.0  CL 100 101 103 103 99  CO2 34* 33* 33* 33* 34*  GLUCOSE 93 92 96 94 93  BUN 22 24* 23 23 25*  CREATININE 1.35* 1.43* 1.33* 1.17* 1.45*  CALCIUM 8.9 8.9 8.5* 9.2 9.2    Liver Function Tests: No results for input(s): AST, ALT, ALKPHOS, BILITOT, PROT, ALBUMIN in the last 168 hours. No results for input(s): LIPASE, AMYLASE in the last 168 hours. No results for input(s): AMMONIA in the last 168 hours.  CBC: Recent Labs  Lab 12/09/18 0422 12/10/18 0449 12/11/18 0501 12/14/18 0527  WBC 4.5 4.3 4.7 5.5  NEUTROABS 3.3  --   --   --   HGB 8.0* 8.3* 8.5* 8.5*  HCT 27.0* 28.0* 29.6* 29.1*  MCV 88.5 87.2 89.4 86.6  PLT 159 176  187 163    Cardiac Enzymes: No results for input(s): CKTOTAL, CKMB, CKMBINDEX, TROPONINI in the last 168 hours.  BNP: Invalid input(s): POCBNP  CBG: No results for input(s): GLUCAP in the last 168 hours.  Microbiology: Results for orders placed or performed during the hospital encounter of 12/04/18  MRSA PCR Screening     Status: None   Collection Time: 12/04/18  9:53 PM   Specimen: Nasopharyngeal  Result Value Ref Range Status   MRSA by PCR NEGATIVE NEGATIVE Final    Comment:        The GeneXpert MRSA Assay (FDA approved for NASAL specimens only), is one component of a comprehensive MRSA colonization surveillance program. It is not intended to diagnose MRSA infection nor to guide or monitor treatment for MRSA infections. Performed at [facility], [address], [place], [place] [address]   SARS Coronavirus 2 (CEPHEID - Performed in [facility] hospital lab), Hosp Order     Status: None   Collection Time: 12/04/18  9:53 PM   Specimen: Nasopharyngeal  Result Value Ref Range Status   SARS Coronavirus 2 NEGATIVE NEGATIVE Final    Comment: (NOTE) If result is NEGATIVE SARS-CoV-2 target nucleic acids are NOT DETECTED. The SARS-CoV-2 RNA is generally detectable in upper and lower  respiratory specimens during  the acute phase of infection. The lowest  concentration of SARS-CoV-2 viral copies this assay can detect is 250  copies / mL. A negative result does not preclude SARS-CoV-2 infection  and should not be used as the sole basis for treatment or other  patient management decisions.  A negative result may occur with  improper specimen collection / handling, submission of specimen other  than nasopharyngeal swab, presence of viral mutation(s) within the  areas targeted by this assay, and inadequate number of viral copies  (<250 copies / mL). A negative result must be combined with clinical  observations, patient history, and epidemiological information. If  result is POSITIVE SARS-CoV-2 target nucleic acids are DETECTED. The SARS-CoV-2 RNA is generally detectable in upper and lower  respiratory specimens dur ing the acute phase of infection.  Positive  results are indicative of active infection with SARS-CoV-2.  Clinical  correlation with patient history and other diagnostic information is  necessary to determine patient infection status.  Positive results do  not rule out bacterial infection or co-infection with other viruses. If result is PRESUMPTIVE POSTIVE SARS-CoV-2 nucleic acids MAY BE PRESENT.   A presumptive positive result was obtained on the submitted specimen  and confirmed on repeat testing.  While 2019 novel coronavirus  (SARS-CoV-2) nucleic acids may be present in the submitted sample  additional confirmatory testing may be necessary for epidemiological  and / or clinical management purposes  to differentiate between  SARS-CoV-2 and other Sarbecovirus currently known to infect humans.  If clinically indicated additional testing with an alternate test  methodology [phone]) is advised. The SARS-CoV-2 RNA is generally  detectable in upper and lower respiratory sp ecimens during the acute  phase of infection. The expected result is Negative. Fact Sheet for Patients:  StrictlyIdeas.no Fact Sheet for Healthcare Providers: BankingDealers.co.za This test is not yet approved or cleared by the [place] FDA and has been authorized for detection and/or diagnosis of SARS-CoV-2 by FDA under an Emergency Use Authorization (EUA).  This EUA will remain in effect (meaning this test can be used) for the duration of the COVID-19 declaration under Section 564(b)(1) of the Act, 21 U.S.C. section 360bbb-3(b)(1), unless the authorization is terminated or revoked sooner. Performed at [facility], [address], [place], [place] [address]   SARS Coronavirus 2 (CEPHEID - Performed in  [facility] hospital lab), Hosp Order     Status: None   Collection Time: 12/10/18 11:29 AM   Specimen: Nasopharyngeal Swab  Result Value Ref Range Status   SARS Coronavirus 2 NEGATIVE NEGATIVE Final    Comment: (NOTE) If result is NEGATIVE SARS-CoV-2 target nucleic acids are NOT DETECTED. The SARS-CoV-2 RNA is generally detectable in upper and lower  respiratory specimens during the acute phase of infection. The lowest  concentration of SARS-CoV-2 viral copies this assay can detect is 250  copies / mL. A negative result does not preclude SARS-CoV-2 infection  and should not be used as the sole basis for treatment or other  patient management decisions.  A negative result may occur with  improper specimen collection / handling, submission of specimen other  than nasopharyngeal swab, presence of viral mutation(s) within the  areas targeted by this assay, and inadequate number of viral copies  (<250 copies / mL). A negative result must be combined with clinical  observations, patient history, and epidemiological information. If result is POSITIVE SARS-CoV-2 target nucleic acids are DETECTED. The SARS-CoV-2 RNA is generally detectable in upper and lower  respiratory specimens dur ing the acute phase of infection.  Positive  results are indicative of active infection with SARS-CoV-2.  Clinical  correlation with patient history and other diagnostic information is  necessary to determine patient infection status.  Positive results do  not rule out bacterial infection or co-infection with other viruses. If result is PRESUMPTIVE POSTIVE SARS-CoV-2 nucleic acids MAY BE PRESENT.   A presumptive positive result was obtained on the submitted specimen  and confirmed on repeat testing.  While 2019 novel coronavirus  (SARS-CoV-2) nucleic acids may be present in the submitted sample  additional confirmatory testing may be necessary for epidemiological  and / or clinical management purposes  to  differentiate between  SARS-CoV-2 and other Sarbecovirus currently known to infect humans.  If clinically indicated additional testing with an alternate test  methodology [phone]) is advised. The SARS-CoV-2 RNA is generally  detectable in upper and lower respiratory sp ecimens during the acute  phase of infection. The expected result is Negative. Fact Sheet for Patients:  StrictlyIdeas.no Fact Sheet for Healthcare Providers: BankingDealers.co.za This test is not yet approved or cleared by the [place] FDA and has been authorized for detection and/or diagnosis of SARS-CoV-2 by FDA under an Emergency Use Authorization (EUA).  This EUA will remain in effect (meaning this test can be used) for the duration of the COVID-19 declaration under Section 564(b)(1) of the Act, 21 U.S.C. section 360bbb-3(b)(1), unless the authorization is terminated or revoked sooner. Performed at [facility], [address] [place], [place] [address]     Coagulation Studies: Recent Labs    12/12/18 0506 12/13/18 0455 12/14/18 0527  LABPROT 31.6* 31.1* 31.0*  INR 3.1* 3.1* 3.0*    Urinalysis: No results for input(s): COLORURINE, LABSPEC, PHURINE, GLUCOSEU, HGBUR, BILIRUBINUR, KETONESUR, PROTEINUR, UROBILINOGEN, NITRITE, LEUKOCYTESUR in the last 72 hours.  Invalid input(s): APPERANCEUR    Imaging: No results found.   Medications:   . sodium chloride     . amiodarone  200 mg Oral Daily  . carvedilol  3.125 mg Oral BID WC  . cholecalciferol  2,000 Units Oral Daily  . feeding supplement (ENSURE ENLIVE)  237 mL Oral BID BM  . [START ON 12/15/2018] furosemide  40 mg Oral BID  . losartan  12.5 mg Oral Daily  . polyethylene glycol  17 g Oral Daily  . potassium chloride SA  20 mEq Oral Daily  . sodium chloride flush  3 mL Intravenous Q12H  . vitamin C  500 mg Oral Daily  . warfarin  2 mg Oral ONCE-1800  . Warfarin - Pharmacist Dosing  Inpatient   Does not apply q1800  . zinc sulfate  220 mg Oral Daily   sodium chloride, acetaminophen, albuterol, hydrOXYzine, ipratropium-albuterol, ondansetron (ZOFRAN) IV, sodium chloride flush, traMADol  Assessment/ Plan:  84 y.o. female  [name] is a 84 y.o. white female with osteoarthritis, chronic back pain, chronic systolic heart failure ejection fraction 25%, GERD, hypertension, lymphedema, paroxysmal atrial fibrillation, mitral valve replacement, who was admitted to [facility] on 12/04/2018 for evaluation of increasing shortness of breath.   1.  Acute renal failure on chronic kidney disease stage III  Baseline creatinine 1.3-1.5. Fluctuates with volume status.   2.  Acute on chronic systolic heart failure: holding furosemide this evening and restarting tomorrow morning. 40mg  bid  3. Hypertension:  On carvedilol, losartan and furosemide as above.    LOS: [place] 6/15/20203:51 PM

## 2018-12-15 DIAGNOSIS — I471 Supraventricular tachycardia: Secondary | ICD-10-CM

## 2018-12-15 LAB — BLOOD GAS, ARTERIAL
Acid-Base Excess: 7.8 mmol/L — ABNORMAL HIGH (ref 0.0–2.0)
Bicarbonate: 32 mmol/L — ABNORMAL HIGH (ref 20.0–28.0)
FIO2: 0.32
O2 Saturation: 98.3 %
Patient temperature: 37
pCO2 arterial: 42 mmHg (ref 32.0–48.0)
pH, Arterial: 7.49 — ABNORMAL HIGH (ref 7.350–7.450)
pO2, Arterial: 101 mmHg (ref 83.0–108.0)

## 2018-12-15 LAB — BASIC METABOLIC PANEL
Anion gap: 9 (ref 5–15)
BUN: 26 mg/dL — ABNORMAL HIGH (ref 8–23)
CO2: 30 mmol/L (ref 22–32)
Calcium: 9.3 mg/dL (ref 8.9–10.3)
Chloride: 102 mmol/L (ref 98–111)
Creatinine, Ser: 1.37 mg/dL — ABNORMAL HIGH (ref 0.44–1.00)
GFR calc Af Amer: 42 mL/min — ABNORMAL LOW (ref >60)
GFR calc non Af Amer: 36 mL/min — ABNORMAL LOW (ref >60)
Glucose, Bld: 91 mg/dL (ref 70–99)
Potassium: 4.2 mmol/L (ref 3.5–5.1)
Sodium: 141 mmol/L (ref 135–145)

## 2018-12-15 LAB — PROTIME-INR
INR: 2.8 — ABNORMAL HIGH (ref 0.8–1.2)
Prothrombin Time: 29.2 seconds — ABNORMAL HIGH (ref 11.4–15.2)

## 2018-12-15 MED ORDER — HYDROXYZINE HCL 25 MG PO TABS: 25.0000 mg | ORAL_TABLET | Freq: Three times a day (TID) | ORAL | 0 refills | Status: DC | PRN

## 2018-12-15 MED ORDER — LOSARTAN POTASSIUM 25 MG PO TABS: 12.5000 mg | ORAL_TABLET | Freq: Every day | ORAL | 0 refills | Status: DC

## 2018-12-15 MED ORDER — VITAMIN C 500 MG PO TABS: 250.0000 mg | ORAL_TABLET | Freq: Two times a day (BID) | ORAL | Status: DC

## 2018-12-15 MED ORDER — TRAMADOL HCL 50 MG PO TABS: 50.0000 mg | ORAL_TABLET | Freq: Four times a day (QID) | ORAL | 0 refills | Status: DC | PRN

## 2018-12-15 MED ORDER — OCUVITE-LUTEIN PO CAPS: 1.0000 | ORAL_CAPSULE | Freq: Every day | ORAL | Status: DC

## 2018-12-15 MED ORDER — WARFARIN SODIUM 4 MG PO TABS
4.0000 mg | ORAL_TABLET | Freq: Once | ORAL | Status: DC
  Filled 2018-12-15: qty 1

## 2018-12-15 MED ORDER — WARFARIN SODIUM 4 MG PO TABS: 4.0000 mg | ORAL_TABLET | Freq: Once | ORAL | 0 refills | Status: DC

## 2018-12-15 MED ORDER — IPRATROPIUM-ALBUTEROL 0.5-2.5 (3) MG/3ML IN SOLN: 3.0000 mL | Freq: Four times a day (QID) | RESPIRATORY_TRACT | 0 refills | Status: AC | PRN

## 2018-12-15 MED ORDER — OCUVITE-LUTEIN PO CAPS: 1.0000 | ORAL_CAPSULE | Freq: Every day | ORAL | 0 refills | Status: DC

## 2018-12-15 MED ORDER — CARVEDILOL 3.125 MG PO TABS: 3.1250 mg | ORAL_TABLET | Freq: Two times a day (BID) | ORAL | 0 refills | Status: DC

## 2018-12-15 NOTE — Progress Notes (Signed)
Progress Note  Patient Name: [name] Date of Encounter: 12/15/2018  Primary Cardiologist: Ida Rogue, MD  Subjective   Patient reports improvement in shortness of breath and that she is back at her baseline.  She denies chest pain, palpitations, or racing heart rate.    She is eager to go home.  Inpatient Medications    Scheduled Meds: . amiodarone  200 mg Oral Daily  . carvedilol  3.125 mg Oral BID WC  . cholecalciferol  2,000 Units Oral Daily  . furosemide  40 mg Oral BID  . losartan  12.5 mg Oral Daily  . [START ON 12/16/2018] multivitamin-lutein  1 capsule Oral Daily  . polyethylene glycol  17 g Oral Daily  . potassium chloride SA  20 mEq Oral Daily  . sodium chloride flush  3 mL Intravenous Q12H  . vitamin C  250 mg Oral BID  . warfarin  4 mg Oral ONCE-1800  . Warfarin - Pharmacist Dosing Inpatient   Does not apply q1800   Continuous Infusions: . sodium chloride     PRN Meds: sodium chloride, acetaminophen, albuterol, hydrOXYzine, ipratropium-albuterol, ondansetron (ZOFRAN) IV, sodium chloride flush, traMADol   Vital Signs    Vitals:   12/14/18 2100 12/15/18 0641 12/15/18 0751 12/15/18 1010  BP: 104/70 (!) 124/56 124/62   Pulse: 60 61 61   Resp: 20 20 17    Temp: 98.2 F (36.8 C) (!) 97.5 F (36.4 C) 97.7 F (36.5 C)   TempSrc: Oral Oral Oral   SpO2: 98% 95% 96% 95%  Weight:  65 kg    Height:        Intake/Output Summary (Last 24 hours) at 12/15/2018 1401 Last data filed at 12/15/2018 0931 Gross per 24 hour  Intake 240 ml  Output 500 ml  Net -260 ml   Filed Weights   12/13/18 0249 12/14/18 0116 12/15/18 0641  Weight: 64.6 kg 64.5 kg 65 kg    Telemetry    AV paced, 60 to 70s- Personally Reviewed  ECG    No new tracings- Personally Reviewed  Physical Exam   GEN: No acute distress.  Sitting in a chair next to the bed. Neck: No JVD Cardiac: RRR.  2/6 systolic murmur. Respiratory:  Bibasilar reduced breath sounds. Significant  improvement in pulmonary exam/breath sounds from that of admission. GI: Soft, nontender, non-distended  MS:  Both calves wrapped in Ace bandages.  No bilateral edema noted; No deformity. Neuro:  Nonfocal  Psych: Normal affect  Labs    Chemistry Recent Labs  Lab 12/12/18 0506 12/14/18 0527 12/15/18 0338  NA 143 141 141  K 3.8 4.0 4.2  CL 103 99 102  CO2 33* 34* 30  GLUCOSE 94 93 91  BUN 23 25* 26*  CREATININE 1.17* 1.45* 1.37*  CALCIUM 9.2 9.2 9.3  GFRNONAA 44* 34* 36*  GFRAA 51* 39* 42*  ANIONGAP 7 8 9      Hematology Recent Labs  Lab 12/10/18 0449 12/11/18 0501 12/14/18 0527  WBC 4.3 4.7 5.5  RBC 3.21* 3.31* 3.36*  HGB 8.3* 8.5* 8.5*  HCT 28.0* 29.6* 29.1*  MCV 87.2 89.4 86.6  MCH 25.9* 25.7* 25.3*  MCHC 29.6* 28.7* 29.2*  RDW 16.4* 16.7* 16.7*  PLT 176 187 163    Cardiac EnzymesNo results for input(s): TROPONINI in the last 168 hours. No results for input(s): TROPIPOC in the last 168 hours.   BNPNo results for input(s): BNP, PROBNP in the last 168 hours.   DDimer No results  for input(s): DDIMER in the last 168 hours.   Radiology    No results found.  Cardiac Studies   TTE 11/05/2018 1. The left ventricle has severely reduced systolic function, with an ejection fraction of 25-30%. The cavity size was mildly dilated. Left ventricular diastolic Doppler parameters are consistent with impaired relaxation. Left ventrical global  hypokinesis without regional wall motion abnormalities. 2. The right ventricle has mildly reduced systolic function. The cavity was moderately enlarged. There is no increase in right ventricular wall thickness. Right ventricular systolic pressure is moderately elevated with an estimated pressure of 53 mmHg. 3. Right atrial size was moderately dilated. 4. Left atrial size was moderately dilated. 5. The aortic valve was not well visualized. Moderate calcification of the aortic valve. Aortic valve regurgitation was not assessed by  color flow Doppler. Unable to estimate adegree of aortic valve stenosis given severely reduced systolic function. 6. Mechanical mitral valve. Mean gradient 5 mm Hg. 7. Tricuspid valve regurgitation is moderate. 8. Left pleural effusion noted, 6.5 cm 9. Rhythm is atrial tachycardia, rate 105 bpm   Patient Profile     84 y.o. female with a history of persistent atrial fibrillation s/p cardioversion (most recently 08/02/1171), chronic systolic congestive heart failure s/p CRT, mechanical mitral valve replacement on chronic Coumadin, mild aortic stenosis, stage IV kidney disease, COPD on home oxygen with recent prolonged hospitalization for spontaneous left calf hematoma and acute blood loss anemia, and who was admitted from clinic due to acute on chronic systolic heart failure with volume overload and hypoxia.  Assessment & Plan    Acute on chronic systolic heart failure - Euvolemic on exam with significantly improved breathing  on 3L [place] oxygen - stated she is back at her baseline. --Repeat BMET performed this AM given slight bump in Cr with results showing renal function improved from yesterday / closer to that of baseline. Cr this admission 1.14  1.45  1.37, BUN 21  25  26; K 4.2. Nephrology consulted this admission and following with most recent 6/16 note stating plan to continue current cardiac medications at discharge with oral Lasix 40 mg twice daily, low-dose carvedilol, losartan. Continue also amiodarone and Warfarin per pharmacy and Coumadin Clinic. Will send message to schedule for TCM / follow-up labs including BMET/CBC after discharge.  Acute on chronic respiratory failure with hypoxia -As above, improved breathing reported with diuresis.   --Etiology multifactorial in the setting of pulmonary edema and bilateral atelectasis.   --Pulmonology consulted this admission with notes indicating Afflovest approval in progress for home use. Incentive spirometer also encouraged this admission  given pulmonology noted patient difficulty with expectorating phlegm.  History of mitral valve replacement -INR therapeutic at 2.8.   --Goal INR 2-3 in the setting of spontaneous left calf hematoma in May 2020.  --Continue Warfarin / follow-up per Coumadin clinic.  For questions or updates, please contact [place] Please consult [email or web address] for contact info under        Signed, Arvil Chaco, PA-C  12/15/2018, 2:01 PM

## 2018-12-15 NOTE — Progress Notes (Signed)
Physical Therapy Treatment Patient Details Name: [name] MRN: [id number] DOB: May 07, 1935 Today's Date: 12/15/2018    History of Present Illness [name] is an 16yoF who comes to [facility] from cardiology after a/c exacerbation of sCHF. Pt has been at [facility] for <1W s/p DC from this site for a similar problem. PMH: AF, chronic back pain, combined CHF, CKD, compression fracture of spine, PPM, MR, pulmonary HTN, LEE, chronic O2 use 3L, recently 4-6L.     PT Comments    Pt in chair (not recliner) at start of session, agreeable to ambulate though reported she was tired from previous events this morning. Pt did not complain of pain and oxygen at rest on 3.5L via [place]. Pt was able to perform sit<>stand x2 this session, CGA and then supervision, good use of hand placement. Pt ambulated ~128ft this session with RW and CGA. Improved tolerance to activity, 2-3 short standing rest breaks. Initially on 3L via [place] for ~36ft, up to 4L spO2>88% for remaining ambulation. Pt requesting to use BSC at end of session and insistent on PT leaving for privacy. Pt educated on importance of safety and calling for assistance when done on the [facility]. Pt sitting on BSC next to bed with call bell, room phone in reach, returned to 3.5L in room, spO2 of 94%. RN notified of pt location and will check on patient. The patient would benefit from further skilled PT to continue to progress towards goals, improve functional activities and maximize independence.    Follow Up Recommendations  Home health PT;Supervision for mobility/OOB     Equipment Recommendations  3in1 (PT)    Recommendations for Other Services OT consult     Precautions / Restrictions Precautions Precautions: Fall Restrictions Weight Bearing Restrictions: No Other Position/Activity Restrictions: Per Dr. Bary Castilla, pt has no weight bearing precautions    Mobility  Bed Mobility               General bed mobility comments: deferred up  in chair  Transfers Overall transfer level: Needs assistance Equipment used: Rolling walker (2 wheeled);None Transfers: Sit to/from Stand Sit to Stand: Supervision;Min guard            Ambulation/Gait Ambulation/Gait assistance: Min guard Gait Distance (Feet): 185 Feet Assistive device: Rolling walker (2 wheeled) Gait Pattern/deviations: Step-through pattern;Decreased step length - right;Decreased step length - left;Trunk flexed Gait velocity: decreased   General Gait Details: Improved tolerance to activity, 2-3 short standing rest breaks. Initially on 3L via [place], ~4ft up to 4L spO2>88%   Stairs             Wheelchair Mobility    Modified Rankin (Stroke Patients Only)       Balance Overall balance assessment: Needs assistance Sitting-balance support: Feet unsupported Sitting balance-Leahy Scale: Good     Standing balance support: Bilateral upper extremity supported Standing balance-Leahy Scale: Fair                              Cognition Arousal/Alertness: Awake/alert Behavior During Therapy: WFL for tasks assessed/performed Overall Cognitive Status: Within Functional Limits for tasks assessed                                        Exercises Other Exercises Other Exercises: Pt requesting to use BSC at end of session and insistent on PT leaving  her for privacy. Pt sitting on BSC next to bed with call bell, room phone in reach. RN notified of pt location and will check on patient.    General Comments        Pertinent Vitals/Pain Pain Assessment: No/denies pain    Home Living                      Prior Function            PT Goals (current goals can now be found in the care plan section) Progress towards PT goals: Progressing toward goals    Frequency    Min 2X/week      PT Plan Current plan remains appropriate    Co-evaluation              AM-PAC PT "6 Clicks" Mobility   Outcome Measure   Help needed turning from your back to your side while in a flat bed without using bedrails?: A Little Help needed moving from lying on your back to sitting on the side of a flat bed without using bedrails?: A Little Help needed moving to and from a bed to a chair (including a wheelchair)?: A Little Help needed standing up from a chair using your arms (e.g., wheelchair or bedside chair)?: A Little Help needed to walk in hospital room?: A Little Help needed climbing 3-5 steps with a railing? : A Little 6 Click Score: 18    End of Session Equipment Utilized During Treatment: Gait belt;Oxygen;Other (comment)(3.5-4L and returned to 3.5L) Activity Tolerance: Patient tolerated treatment well Patient left: Other (comment);with call bell/phone within reach(on BSC, call bell and phone in reach, pt educated on importance of calling prior to mobility. RN notified and will see pt) Nurse Communication: Mobility status(on BSC, call bell and phone in reach, pt educated on importance of calling prior to mobility. RN notified and will see pt) PT Visit Diagnosis: Other abnormalities of gait and mobility (R26.89);Muscle weakness (generalized) (M62.81);Difficulty in walking, not elsewhere classified (R26.2);Pain Pain - Right/Left: Left Pain - part of body: Leg     Time: 0950-1011 PT Time Calculation (min) (ACUTE ONLY): 21 min  Charges:  $Therapeutic Exercise: 8-22 mins                     Lieutenant Diego PT, DPT 11:00 AM,12/15/18 [phone]

## 2018-12-15 NOTE — Consult Note (Signed)
[place] for warfarin dose management Indication: atrial fibrillation  Patient Measurements: Height: 4\' 11"  (149.9 cm) Weight: 143 lb 6.4 oz (65 kg) IBW/kg (Calculated) : 43.2  Vital Signs: Temp: 97.7 F (36.5 C) (06/16 0751) Temp Source: Oral (06/16 0751) BP: 124/62 (06/16 0751) Pulse Rate: 61 (06/16 0751)  Labs: Recent Labs    12/13/18 0455 12/14/18 0527 12/15/18 0338  HGB  --  8.5*  --   HCT  --  29.1*  --   PLT  --  163  --   LABPROT 31.1* 31.0* 29.2*  INR 3.1* 3.0* 2.8*  CREATININE  --  1.45*  --     Estimated Creatinine Clearance: 24.9 mL/min (A) (by C-G formula based on SCr of 1.45 mg/dL (H)).   Medical History: Past Medical History:  Diagnosis Date  . Arthritis    "back, hands, knees" (04/10/2016)  . Chronic back pain   . 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 EF 25% by echo in 08/2015 b. RHC in 08/2015 showed normal filling pressures; c. 10/2018 Echo (in setting of atrial tachycardia): EF 25-30%, impaired relaxation. Glob HK. RVSP 74mmHg. Mod dil RA/LA. Mech MV w/ nl fxn (mean grad 40mmHg). Mod TR.   Marland Kitchen Chronic kidney disease   . Compression fracture    "several; all in my back" (04/10/2016)  . GERD (gastroesophageal reflux disease)   . Hypertension   . Lymphedema   . Pacemaker 05/2017  . PAF (paroxysmal atrial fibrillation) ([place])    a. On amio & Coumadin - CHA2DS2VASc = 4.  . S/P MVR (mitral valve replacement)    a. MVR 1994 b. redo MVR in 08/2014 - on Coumadin  . Shortness of breath dyspnea     Medications:  Scheduled:  . amiodarone  200 mg Oral Daily  . carvedilol  3.125 mg Oral BID WC  . cholecalciferol  2,000 Units Oral Daily  . feeding supplement (ENSURE ENLIVE)  237 mL Oral BID BM  . furosemide  40 mg Oral BID  . losartan  12.5 mg Oral Daily  . polyethylene glycol  17 g Oral Daily  . potassium chloride SA  20 mEq Oral Daily  . sodium chloride flush   3 mL Intravenous Q12H  . vitamin C  500 mg Oral Daily  . Warfarin - Pharmacist Dosing Inpatient   Does not apply q1800  . zinc sulfate  220 mg Oral Daily    Assessment: Pt was on warfarin previously for atrial fibrillation with mechanical mitral valve. On admission patient's INR was 3.1 (up to 3.6 today). Pt is on amiodarone which can increase INR. Pt was discharged from the hospital couple of weeks ago and warfarin dose was decreased ~10% from previous dose. Pt was discharged on a regimen of 4 mg daily. Her previous dose wasf warfarin 6 mg MWF and 3 mg TThSaSun.  Home warfarin dose: At home patient takes 4 mg daily- she is a resident at [facility] in [place]. The facility was called to confirm the dose changed from prior admission.   Today's INR is therapeutic.    --DDI with amiodarone (home med)   6/5 INR 3.1 6/6 INR 3.6  HELD 6/7 INR 3.0 (3 mg given) 6/8 INR 2.6 (4 mg given) 6/9 INR 2.5 (4 mg given) 6/10 INR 2.6 (4 mg given) 6/11 INR 2.5 (4 mg given) 6/12 INR 2.6 (4 mg given) 6/13 INR 3.1 (2 mg given) 6/14 INR  3.1 (2 mg given)  6/15 INR 3.0 (2 mg given)  6/16 INR 2.8 (4 mg ordered)  Goal of Therapy:  INR 2-3 (per cardiology on previous visit d/t bleed during that admission, even with valve). Update: Discussed with Dr.  Saunders Revel INR goal. Will continue to keep goal of 2-3, but will favor an estimated range of 2.5-3.  Monitor platelets by anticoagulation protocol: Yes   Plan:  INR is at goal range. Will order warfarin 4 mg x 1 and repeat INR in AM. May need to adjust regimen to 4 mg Monday through Friday, and 2 mg Saturday and Sunday for a ~ 10% decrease from current home dose.   Oswald Hillock, PharmD, BCPS 12/15/2018,9:06 AM

## 2018-12-15 NOTE — Progress Notes (Signed)
Nutrition Follow Up Note  RD working remotely.  DOCUMENTATION CODES:   Not applicable  INTERVENTION:   Pt drinking Boost supplements brought from home  Ocuvite daily for wound healing (provides zinc, vitamin A, vitamin C, Vitamin E, copper, and selenium)  Vitamin C 250mg  po BID  Recommend check iron/anemia labs   NUTRITION DIAGNOSIS:   Increased nutrient needs related to wound healing as evidenced by increased estimated needs.  GOAL:   Patient will meet greater than or equal to 90% of their needs  -progressing   MONITOR:   PO intake, Supplement acceptance, Labs, Weight trends, Skin, I & O's  ASSESSMENT:   84 year old female with PMHx of mitral valve replacement with mechanical valve on chronic Coumadin, atrial fibrillation/flutter s/p pacemaker, CKD IV, HTN, chronic systolic heart failure, lymphedema, GERD, HTN, left lower extremity wound s/p debridement admitted with acute on chronic systolic and diastolic heart failure.   Spoke with pt via phone. Pt reports improved appetite and oral intake; pt eating 75-100% of meals and drinking Boost supplements brought from home. Per chart, pt appears weight stable since admit. Recommended to patient that she drink 2-3 Boost supplements per day. Discussed the importance of adequate protein intake needed for wound healing. Pt reports that she has been trying to order protein rich foods with her meals.   Will discontinue zinc, as long term zinc supplementation can deplete other metals such as copper and iron. Will add ocuvite instead as this provides 40mg  of elemental zinc along with 2 mg of copper. Will also change vitamin C to twice daily at only ~200-300mg  of zinc absorbed at one time.      Of note, pt with microcytic anemia; would recommend check iron, TIBC, transferrin, folate, ferritin and B12 labs.   Medications reviewed and include: vitamin D, lasix, miralax, KCl, vitamin C, warfarin   Labs reviewed: BUN 25(H), creat 1.45(H) Hgb  8.5(L), Hct 29.1(L), MCH 25.3(L), MCHC 29.2(L)  Diet Order:   Diet Order            Diet 2 gram sodium Room service appropriate? Yes with Assist; Fluid consistency: Thin  Diet effective now             EDUCATION NEEDS:   Not appropriate for education at this time  Skin:  Skin Assessment: Skin Integrity Issues:(closed incision left leg)  Last BM:  12/06/2018 - small type 5  Height:   Ht Readings from Last 1 Encounters:  12/04/18 4\' 11"  (1.499 m)   Weight:   Wt Readings from Last 1 Encounters:  12/15/18 65 kg   Ideal Body Weight:  44.7 kg  BMI:  Body mass index is 28.96 kg/m.  Estimated Nutritional Needs:   Kcal:  1500-1700  Protein:  75-85 grams  Fluid:  per MD  Koleen Distance MS, RD, LDN Pager #- [phone] Office#- [phone] After Hours Pager: [phone]

## 2018-12-15 NOTE — Plan of Care (Signed)
Pt ready for discharge.   Problem: Education: Goal: Knowledge of General Education information will improve Description: Including pain rating scale, medication(s)/side effects and non-pharmacologic comfort measures Outcome: Completed/Met   Problem: Health Behavior/Discharge Planning: Goal: Ability to manage health-related needs will improve Outcome: Completed/Met   Problem: Clinical Measurements: Goal: Ability to maintain clinical measurements within normal limits will improve Outcome: Completed/Met Goal: Will remain free from infection Outcome: Completed/Met Goal: Diagnostic test results will improve Outcome: Completed/Met Goal: Respiratory complications will improve Outcome: Completed/Met Goal: Cardiovascular complication will be avoided Outcome: Completed/Met   Problem: Activity: Goal: Risk for activity intolerance will decrease Outcome: Completed/Met   Problem: Nutrition: Goal: Adequate nutrition will be maintained Outcome: Completed/Met   Problem: Coping: Goal: Level of anxiety will decrease Outcome: Completed/Met   Problem: Elimination: Goal: Will not experience complications related to bowel motility Outcome: Completed/Met Goal: Will not experience complications related to urinary retention Outcome: Completed/Met   Problem: Pain Managment: Goal: General experience of comfort will improve Outcome: Completed/Met   Problem: Safety: Goal: Ability to remain free from injury will improve Outcome: Completed/Met   Problem: Skin Integrity: Goal: Risk for impaired skin integrity will decrease Outcome: Completed/Met   Problem: Education: Goal: Ability to demonstrate management of disease process will improve Outcome: Completed/Met Goal: Ability to verbalize understanding of medication therapies will improve Outcome: Completed/Met Goal: Individualized Educational Video(s) Outcome: Completed/Met   Problem: Activity: Goal: Capacity to carry out activities will  improve Outcome: Completed/Met   Problem: Cardiac: Goal: Ability to achieve and maintain adequate cardiopulmonary perfusion will improve Outcome: Completed/Met   Problem: Acute Rehab PT Goals(only PT should resolve) Goal: Pt Will Transfer Bed To Chair/Chair To Bed Outcome: Completed/Met Goal: Pt Will Ambulate Outcome: Completed/Met   Problem: Increased Nutrient Needs (NI-5.1) Goal: Food and/or nutrient delivery Description: Individualized approach for food/nutrient provision. Outcome: Completed/Met   Problem: Acute Rehab OT Goals (only OT should resolve) Goal: Pt. Will Perform Upper Body Bathing Outcome: Completed/Met Goal: Pt. Will Perform Lower Body Bathing Outcome: Completed/Met Goal: Pt. Will Transfer To Toilet Outcome: Completed/Met Goal: OT Additional ADL Goal #1 Outcome: Completed/Met

## 2018-12-15 NOTE — Plan of Care (Signed)
  Problem: Education: Goal: Knowledge of General Education information will improve Description: Including pain rating scale, medication(s)/side effects and non-pharmacologic comfort measures Outcome: Progressing   Problem: Clinical Measurements: Goal: Cardiovascular complication will be avoided Outcome: Progressing   Problem: Activity: Goal: Risk for activity intolerance will decrease Outcome: Progressing   Problem: Coping: Goal: Level of anxiety will decrease Outcome: Progressing

## 2018-12-15 NOTE — Discharge Instructions (Signed)
Follow-up with a primary care physician in 3 days Follow-up with cardiology Dr. Rockey Situ End in 5 to 7 days. Follow-up with pulmonology [place] in 7 to 10 days Follow-up with nephrology Dr. Juleen China in 7 to 10 days Follow-up with Dr. Bary Castilla as recommended-wound care instructions by Dr. Tollie Pizza Check PT/INR on 12/17/2018 , fax results to Dr. Rockey Situ and further management of Coumadin by Dr. Rockey Situ Outpatient CHF clinic Monitor daily weights, intake and output Continue oxygen 3 L via nasal cannula

## 2018-12-15 NOTE — Discharge Summary (Addendum)
[facility] [place] NAME: [name]    MR#:  [id number]  DATE OF BIRTH:  [birth year]  DATE OF ADMISSION:  12/04/2018 ADMITTING PHYSICIAN: Carrie Bow, MD  DATE OF DISCHARGE:  12/15/18   PRIMARY CARE PHYSICIAN: Carrie Aus, MD    ADMISSION DIAGNOSIS:  Acute Heart Failure  DISCHARGE DIAGNOSIS:   Acute systolic CHF Left lower extremity wound from the pressure injury Diaphragm disorders SECONDARY DIAGNOSIS:   Past Medical History:  Diagnosis Date  . Arthritis    "back, hands, knees" (04/10/2016)  . Chronic back pain   . 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 EF 25% by echo in 08/2015 b. RHC in 08/2015 showed normal filling pressures; c. 10/2018 Echo (in setting of atrial tachycardia): EF 25-30%, impaired relaxation. Glob HK. RVSP 19mmHg. Mod dil RA/LA. Mech MV w/ nl fxn (mean grad 61mmHg). Mod TR.   Marland Kitchen Chronic kidney disease   . Compression fracture    "several; all in my back" (04/10/2016)  . GERD (gastroesophageal reflux disease)   . Hypertension   . Lymphedema   . Pacemaker 05/2017  . PAF (paroxysmal atrial fibrillation) ([place])    a. On amio & Coumadin - CHA2DS2VASc = 4.  . S/P MVR (mitral valve replacement)    a. MVR 1994 b. redo MVR in 08/2014 - on Coumadin  . Shortness of breath dyspnea     HOSPITAL COURSE:  HPI  [name]  is a 84 y.o. female with a known history of atrial fibrillation, combined CHF, nonischemic cardiomyopathy, hypertension, chronic lower extremity edema, mitral valve disease status post mechanical valve replacement and currently on anticoagulation therapy with Coumadin.  Patient also has a history of CKD.  She was referred for direct admission from her cardiologist, Dr. Rockey [name], office for acute exacerbation of combined CHF.  Ejection fraction on echocardiogram completed 11/05/2018 was 25 to 30% which was decreased from 40% in December  2019.  Patient has been admitted twice over the last month.  Initial admission was related to exacerbation of CHF with chronic renal failure as well as an episode of atrial tachycardia.  She underwent cardioversion on May 8.  After patient was discharged home, she returned with large spontaneous hematoma to her left calf which ultimately required debridement and biological graft placement which occurred on May 13.  Patient was then discharged to rehabilitation center where she has been up to this point.  She now returns to be admitted from the cardiologist office for exacerbation of her CHF with increased edema of her lower extremities as well as increased shortness of breath with minimal exertion and at rest when lying flat.  According to documentation, Lasix has already been decreased to 40 mg twice daily which is decreased from 80 mg twice daily.  Was decreased due to hypotension and mild renal insufficiency.  Chest x-ray today demonstrates mild pulmonary edema with BNP of 996.  She denies chest pain.  She denies productive cough.  She denies palpitations.  She notes orthopnea at times.  She denies experiencing dizziness.  She denies PND.  Patient denies syncopal episodes.  She is on oxygen at 2 to 3 L/min 24 hours a day.  We have admitted her to the hospital service for continued management with cardiology consultation.  Hospital course  Acute on chronic combined systolic and diastolic CHF- resolved. -Continue low dose coreg l resume Lasix 40 mg twice a day, monitor  creatinine -Daily weights, strict I/O -Cardiology following, discussed with Dr. Fletcher [name].  Okay to discharge patient from cardiology standpoint - Paroxysmal atrial fibrillation + Mechanical MVR- AV paced. -Continue coumadin and amiodarone -INR at 2.8.  Goal INR 2.5-3.0 per cardiology -Coumadin 4 mg today and on 12/16/2018.  Repeat PT/INR check on 12/17/2018.further Coumadin management by cardiology    generalized anxiety disorder  Lexapro is not considered in view of prolonged QT's Discussed with daughter, not considering  psychiatry consult at this point of time.  Not considering SSRIs Atarax as needed  Chronic respiratory failure secondary to COPD, diaphragm disorders due to hyperinflation of the lungsno signs of exacerbation. Currently on home 3L O2. -Duonebs prn -Pulmonology has seen the patient Appreciate their recommendations Outpatient follow-up with pulmonology Patient will benefit from a fflovest therapy to prevent further hospitalizations and death.  Patient has tried on flutter valve and other breathing techniques without improvement  Left lower extremity wound status post debridement of large hematoma -[name] following The area was gently debrided with a saline dampened sponge followed by application of a Sorbact dressing base, vaseline soaked gauze, fluff gauze and a light ace wrap. Left lower extremity wound care: The daily dressings should be to remove the 4x4 gauze, rinse with saline, apply KY jelly to SATURATE the new gauze, apply to the wound (leaving the green, Sorbact dressing in place) followed by Kerlex and a light ace wrap.   Elevated troponin- likely demand ischemia. Troponins flat.  Hypertension- BPs have been soft -Continue low dose coreg -Holding losartan for now  CKD III -baseline creatinine seems to be at 1.3-1.5 -Continue to monitor and avoid nephrotoxins Nephrology is following discussed with Dr. Juleen [name].  Holding Lasix for today and monitor renal function in a.m. if stable will resume Lasix 40 mg twice daily Creatinine at 1.37  today  DVT prophylaxis- coumadin.  Ambulating in the hallway with nurses assistance  Plan for discharge today with  home health PT.  Discussed with the daughter she is agreeable DISCHARGE CONDITIONS:   fair  CONSULTS OBTAINED:  Treatment Team:  Carrie Breeding, MD Carrie Glazier, MD [name] Carrie Gleason, MD   PROCEDURES wound care   DRUG ALLERGIES:   Allergies  Allergen Reactions  . Ace Inhibitors     Cough  . Doxycycline Nausea Only  . Hydrocodone Itching and Nausea Only  . Mercury     Other reaction(s): Unknown  . Silver Dermatitis    Severe itching  . Cephalexin Rash  . Clindamycin/Lincomycin Rash  . Nickel Rash  . Penicillins Rash    Has patient had a PCN reaction causing immediate rash, facial/tongue/throat swelling, SOB or lightheadedness with hypotension: YES Has patient had a PCN reaction causing severe rash involving mucus membranes or skin necrosis: NO Has patient had a PCN reaction that required hospitalization NO Has patient had a PCN reaction occurring within the last 10 years: NO If all of the above answers are "NO", then may proceed with Cephalosporin use.    DISCHARGE MEDICATIONS:   Allergies as of 12/15/2018      Reactions   Ace Inhibitors    Cough   Doxycycline Nausea Only   Hydrocodone Itching, Nausea Only   Mercury    Other reaction(s): Unknown   Silver Dermatitis   Severe itching   Cephalexin Rash   Clindamycin/lincomycin Rash   Nickel Rash   Penicillins Rash   Has patient had a PCN reaction causing immediate rash, facial/tongue/throat swelling, SOB or lightheadedness with hypotension: YES Has  patient had a PCN reaction causing severe rash involving mucus membranes or skin necrosis: NO Has patient had a PCN reaction that required hospitalization NO Has patient had a PCN reaction occurring within the last 10 years: NO If all of the above answers are "NO", then may proceed with Cephalosporin use.      Medication List    STOP taking these medications   ALPRAZolam 0.25 MG tablet Commonly known as: XANAX   oxyCODONE 5 MG immediate release tablet Commonly known as: Oxy IR/ROXICODONE   RISA-BID PROBIOTIC PO     TAKE these medications   acetaminophen 650 MG CR tablet Commonly known as: TYLENOL Take 1 tablet (650 mg total) by mouth every 6 (six) hours as needed for pain  (mild and moderate pain).   albuterol (2.5 MG/3ML) 0.083% nebulizer solution Commonly known as: PROVENTIL Take 3 mLs (2.5 mg total) by nebulization every 6 (six) hours as needed for wheezing or shortness of breath.   amiodarone 200 MG tablet Commonly known as: PACERONE Take 1 tablet (200 mg total) by mouth daily.   carvedilol 3.125 MG tablet Commonly known as: COREG Take 1 tablet (3.125 mg total) by mouth 2 (two) times daily with a meal.   furosemide 40 MG tablet Commonly known as: LASIX Take 1 tablet (40 mg total) by mouth 2 (two) times daily.   hydrOXYzine 25 MG tablet Commonly known as: ATARAX/VISTARIL Take 1 tablet (25 mg total) by mouth 3 (three) times daily as needed for itching or anxiety.   Icy Hot 5 % Ptch Generic drug: Menthol Apply 1 patch topically daily as needed (pain).   ipratropium-albuterol 0.5-2.5 (3) MG/3ML Soln Commonly known as: DUONEB Take 3 mLs by nebulization every 6 (six) hours as needed for up to 90 doses.   losartan 25 MG tablet Commonly known as: COZAAR Take 0.5 tablets (12.5 mg total) by mouth daily. Start taking on: December 16, 2018 What changed: how much to take   multivitamin-lutein Caps capsule Take 1 capsule by mouth daily. Start taking on: December 16, 2018   polyethylene glycol 17 g packet Commonly known as: MIRALAX / GLYCOLAX Take 17 g by mouth daily.   potassium chloride SA 20 MEQ tablet Commonly known as: K-DUR Take 1 tablet (20 mEq total) by mouth daily.   traMADol 50 MG tablet Commonly known as: ULTRAM Take 1 tablet (50 mg total) by mouth every 6 (six) hours as needed for moderate pain.   vitamin C 500 MG tablet Commonly known as: ASCORBIC ACID Take 500 mg by mouth daily.   Vitamin D 50 MCG (2000 UT) tablet Take 2,000 Units by mouth daily.   warfarin 4 MG tablet Commonly known as: COUMADIN Take as directed. If you are unsure how to take this medication, talk to your nurse or doctor. Original instructions: Take 1 tablet (4  mg total) by mouth one time only at 6 PM for 1 day.   zinc sulfate 220 (50 Zn) MG capsule Take 1 capsule (220 mg total) by mouth daily.            Durable Medical Equipment  (From admission, onward)         Start     Ordered   12/15/18 1446  For home use only DME Bedside commode  Once    Question:  Patient needs a bedside commode to treat with the following condition  Answer:  Weakness   12/15/18 1445   12/09/18 1308  For home use only DME Nebulizer machine  Once    Question:  Patient needs a nebulizer to treat with the following condition  Answer:  COPD (chronic obstructive pulmonary disease) ([place])   12/09/18 1307           DISCHARGE INSTRUCTIONS:  Follow-up with a primary care physician in 3 days Follow-up with cardiology Dr. Rockey [name] End in 5 to 7 days. Follow-up with pulmonology [place] in 7 to 10 days Follow-up with nephrology Dr. Juleen [name] in 7 to 10 days Follow-up with [name] as recommended-wound care instructions by Dr. Tollie Pizza Check PT/INR on 12/17/2018 , fax results to Dr. Rockey [name] and further management of Coumadin by Dr. Rockey [name] Outpatient CHF clinic Monitor daily weights, intake and output Continue oxygen 3 L via nasal cannula   DIET:  Diet recommended at Discharge:  Regular diet w/ CUT meats, well-moistened foods - less Meats and Breads in her diet d/t the Esophageal phase dysmotility; Thin liquids by Cup(less air swallowed). General aspiration precautions; strict REFLUX Precautions d/t baseline Esophageal phase dysmotility. Pills in Puree or in liquid, chewable, powder forms for easier swallowing  DISCHARGE CONDITION:  Fair  ACTIVITY:  Activity as tolerated  OXYGEN:  Home Oxygen: Yes.     Oxygen Delivery: 3 liters/min via Patient connected to nasal cannula oxygen  DISCHARGE LOCATION:  home   If you experience worsening of your admission symptoms, develop shortness of breath, life threatening emergency, suicidal or homicidal thoughts you must  seek medical attention immediately by calling 911 or calling your MD immediately  if symptoms less severe.  You Must read complete instructions/literature along with all the possible adverse reactions/side effects for all the Medicines you take and that have been prescribed to you. Take any new Medicines after you have completely understood and accpet all the possible adverse reactions/side effects.   Please note  You were cared for by a hospitalist during your hospital stay. If you have any questions about your discharge medications or the care you received while you were in the hospital after you are discharged, you can call the unit and asked to speak with the hospitalist on call if the hospitalist that took care of you is not available. Once you are discharged, your primary care physician will handle any further medical issues. Please note that NO REFILLS for any discharge medications will be authorized once you are discharged, as it is imperative that you return to your primary care physician (or establish a relationship with a primary care physician if you do not have one) for your aftercare needs so that they can reassess your need for medications and monitor your lab values.     Today  No chief complaint on file.  Patient is doing much better out of bed to chair.  Very anxious to go home.  Daughter [name] at bedside.  Agreeable to take her home with home health  ROS:  CONSTITUTIONAL: Denies fevers, chills. Denies any fatigue, weakness.  EYES: Denies blurry vision, double vision, eye pain. EARS, NOSE, THROAT: Denies tinnitus, ear pain, hearing loss. RESPIRATORY: Denies cough, wheeze, shortness of breath.  CARDIOVASCULAR: Denies chest pain, palpitations, edema.  GASTROINTESTINAL: Denies nausea, vomiting, diarrhea, abdominal pain. Denies bright red blood per rectum. GENITOURINARY: Denies dysuria, hematuria. ENDOCRINE: Denies nocturia or thyroid problems. HEMATOLOGIC AND LYMPHATIC: Denies  easy bruising or bleeding. SKIN: Lower extremity wounds MUSCULOSKELETAL: Denies pain in neck, back, shoulder, knees, hips or arthritic symptoms.  NEUROLOGIC: Denies paralysis, paresthesias.  PSYCHIATRIC: Denies anxiety or depressive symptoms.   VITAL SIGNS:  Blood pressure 124/62,  pulse 61, temperature 97.7 F (36.5 C), temperature source Oral, resp. rate 17, height 4\' 11"  (1.499 m), weight 65 kg, SpO2 95 %.  I/O:    Intake/Output Summary (Last 24 hours) at 12/15/2018 1449 Last data filed at 12/15/2018 0931 Gross per 24 hour  Intake 240 ml  Output 500 ml  Net -260 ml    PHYSICAL EXAMINATION:  GENERAL:  84 y.o.-year-old patient lying in the bed with no acute distress.  EYES: Pupils equal, round, reactive to light and accommodation. No scleral icterus. Extraocular muscles intact.  HEENT: Head atraumatic, normocephalic. Oropharynx and nasopharynx clear.  NECK:  Supple, no jugular venous distention. No thyroid enlargement, no tenderness.  LUNGS: Normal breath sounds bilaterally, no wheezing, rales,rhonchi or crepitation. No use of accessory muscles of respiration.  CARDIOVASCULAR: S1, S2 normal. No murmurs, rubs, or gallops.  ABDOMEN: Soft, non-tender, non-distended. Bowel sounds present.  EXTREMITIES: Bilateral lower extremity with Ace wraps.  Left lower extremity wound pictures below.  Lower extremity edema 1+ NEUROLOGIC: Cranial nerves II through XII are intact. Muscle strength 5/5 in all extremities. Sensation intact. Gait not checked.  PSYCHIATRIC: The patient is alert and oriented x 3.  SKIN: No obvious rash          DATA REVIEW:   CBC Recent Labs  Lab 12/14/18 0527  WBC 5.5  HGB 8.5*  HCT 29.1*  PLT 163    Chemistries  Recent Labs  Lab 12/15/18 0338  NA 141  K 4.2  CL 102  CO2 30  GLUCOSE 91  BUN 26*  CREATININE 1.37*  CALCIUM 9.3    Cardiac Enzymes No results for input(s): TROPONINI in the last 168 hours.  Microbiology Results  Results for  orders placed or performed during the hospital encounter of 12/04/18  MRSA PCR Screening     Status: None   Collection Time: 12/04/18  9:53 PM   Specimen: Nasopharyngeal  Result Value Ref Range Status   MRSA by PCR NEGATIVE NEGATIVE Final    Comment:        The GeneXpert MRSA Assay (FDA approved for NASAL specimens only), is one component of a comprehensive MRSA colonization surveillance program. It is not intended to diagnose MRSA infection nor to guide or monitor treatment for MRSA infections. Performed at [facility], [address] [place], [place] [address]   SARS Coronavirus 2 (CEPHEID - Performed in [facility] hospital lab), Hosp Order     Status: None   Collection Time: 12/04/18  9:53 PM   Specimen: Nasopharyngeal  Result Value Ref Range Status   SARS Coronavirus 2 NEGATIVE NEGATIVE Final    Comment: (NOTE) If result is NEGATIVE SARS-CoV-2 target nucleic acids are NOT DETECTED. The SARS-CoV-2 RNA is generally detectable in upper and lower  respiratory specimens during the acute phase of infection. The lowest  concentration of SARS-CoV-2 viral copies this assay can detect is 250  copies / mL. A negative result does not preclude SARS-CoV-2 infection  and should not be used as the sole basis for treatment or other  patient management decisions.  A negative result may occur with  improper specimen collection / handling, submission of specimen other  than nasopharyngeal swab, presence of viral mutation(s) within the  areas targeted by this assay, and inadequate number of viral copies  (<250 copies / mL). A negative result must be combined with clinical  observations, patient history, and epidemiological information. If result is POSITIVE SARS-CoV-2 target nucleic acids are DETECTED. The SARS-CoV-2 RNA is  generally detectable in upper and lower  respiratory specimens dur ing the acute phase of infection.  Positive  results are indicative of active infection  with SARS-CoV-2.  Clinical  correlation with patient history and other diagnostic information is  necessary to determine patient infection status.  Positive results do  not rule out bacterial infection or co-infection with other viruses. If result is PRESUMPTIVE POSTIVE SARS-CoV-2 nucleic acids MAY BE PRESENT.   A presumptive positive result was obtained on the submitted specimen  and confirmed on repeat testing.  While 2019 novel coronavirus  (SARS-CoV-2) nucleic acids may be present in the submitted sample  additional confirmatory testing may be necessary for epidemiological  and / or clinical management purposes  to differentiate between  SARS-CoV-2 and other Sarbecovirus currently known to infect humans.  If clinically indicated additional testing with an alternate test  methodology [phone]) is advised. The SARS-CoV-2 RNA is generally  detectable in upper and lower respiratory sp ecimens during the acute  phase of infection. The expected result is Negative. Fact Sheet for Patients:  StrictlyIdeas.no Fact Sheet for Healthcare Providers: BankingDealers.co.za This test is not yet approved or cleared by the [place] FDA and has been authorized for detection and/or diagnosis of SARS-CoV-2 by FDA under an Emergency Use Authorization (EUA).  This EUA will remain in effect (meaning this test can be used) for the duration of the COVID-19 declaration under Section 564(b)(1) of the Act, 21 U.S.C. section 360bbb-3(b)(1), unless the authorization is terminated or revoked sooner. Performed at [facility], [address], [place], [place] [address]   SARS Coronavirus 2 (CEPHEID - Performed in [facility] hospital lab), Hosp Order     Status: None   Collection Time: 12/10/18 11:29 AM   Specimen: Nasopharyngeal Swab  Result Value Ref Range Status   SARS Coronavirus 2 NEGATIVE NEGATIVE Final    Comment: (NOTE) If result is NEGATIVE  SARS-CoV-2 target nucleic acids are NOT DETECTED. The SARS-CoV-2 RNA is generally detectable in upper and lower  respiratory specimens during the acute phase of infection. The lowest  concentration of SARS-CoV-2 viral copies this assay can detect is 250  copies / mL. A negative result does not preclude SARS-CoV-2 infection  and should not be used as the sole basis for treatment or other  patient management decisions.  A negative result may occur with  improper specimen collection / handling, submission of specimen other  than nasopharyngeal swab, presence of viral mutation(s) within the  areas targeted by this assay, and inadequate number of viral copies  (<250 copies / mL). A negative result must be combined with clinical  observations, patient history, and epidemiological information. If result is POSITIVE SARS-CoV-2 target nucleic acids are DETECTED. The SARS-CoV-2 RNA is generally detectable in upper and lower  respiratory specimens dur ing the acute phase of infection.  Positive  results are indicative of active infection with SARS-CoV-2.  Clinical  correlation with patient history and other diagnostic information is  necessary to determine patient infection status.  Positive results do  not rule out bacterial infection or co-infection with other viruses. If result is PRESUMPTIVE POSTIVE SARS-CoV-2 nucleic acids MAY BE PRESENT.   A presumptive positive result was obtained on the submitted specimen  and confirmed on repeat testing.  While 2019 novel coronavirus  (SARS-CoV-2) nucleic acids may be present in the submitted sample  additional confirmatory testing may be necessary for epidemiological  and / or clinical management purposes  to differentiate between  SARS-CoV-2 and  other Sarbecovirus currently known to infect humans.  If clinically indicated additional testing with an alternate test  methodology [phone]) is advised. The SARS-CoV-2 RNA is generally  detectable in upper  and lower respiratory sp ecimens during the acute  phase of infection. The expected result is Negative. Fact Sheet for Patients:  StrictlyIdeas.no Fact Sheet for Healthcare Providers: BankingDealers.co.za This test is not yet approved or cleared by the [place] FDA and has been authorized for detection and/or diagnosis of SARS-CoV-2 by FDA under an Emergency Use Authorization (EUA).  This EUA will remain in effect (meaning this test can be used) for the duration of the COVID-19 declaration under Section 564(b)(1) of the Act, 21 U.S.C. section 360bbb-3(b)(1), unless the authorization is terminated or revoked sooner. Performed at [facility], [address] [place], [place] [address]     RADIOLOGY:  No results found.  EKG:   Orders placed or performed during the hospital encounter of 12/04/18  . 12 lead EKG  . 12 lead EKG      Management plans discussed with the patient, daughter [name] at bedside and they are in agreement.  CODE STATUS:     Code Status Orders  (From admission, onward)         Start     Ordered   12/04/18 1932  Full code  Continuous     12/04/18 1937        Code Status History    Date Active Date Inactive Code Status Order ID Comments User Context   11/24/2018 1621 11/30/2018 2027 Full Code [id number]  Fritzi Mandes, MD Inpatient   11/24/2018 1557 11/24/2018 1620 DNR [id number]  Fritzi Mandes, MD Inpatient   11/09/2018 0333 11/24/2018 1557 Full Code [id number]  Mayer Camel, NP ED   11/04/2018 1650 11/08/2018 2006 Full Code [id number]  Saundra Shelling, MD Inpatient   04/10/2016 2034 04/19/2016 1757 Full Code [id number]  Vianne Bulls, MD Inpatient   09/29/2015 1053 10/10/2015 1406 Full Code [id number]  [name], MD Inpatient   04/28/2015 1626 04/30/2015 1422 Full Code [id number]  Demetrios Loll, MD Inpatient   Advance Care Planning Activity      TOTAL TIME TAKING CARE OF THIS PATIENT: 60  minutes.   Note: This dictation was prepared with Dragon dictation along with smaller phrase technology. Any transcriptional errors that result from this process are unintentional.   @[facility] @  on 12/15/2018 at 2:49 PM  Between 7am to 6pm - Pager - [phone]  After 6pm go to [email or web address] - password [email or web address] [place] [facility]  Office  [phone]  CC: Primary care physician; Carrie Aus, MD

## 2018-12-15 NOTE — TOC Transition Note (Addendum)
Transition of Care [facility]) - CM/SW Discharge Note   Patient Details  Name: [name] MRN: [id number] Date of Birth: 13-Jun-1935  Transition of Care [facility]) CM/SW Contact:  Elza Rafter, RN Phone Number: 12/15/2018, 2:01 PM   Clinical Narrative:   Patient discharging to home today with daughter.  [place] is aware to start home health services-RN, PT & wound care.  [name] only wants RN and PT.   [place] will deliver Afflovest and BSC to home.  [place] is aware of discharge.  [name], daughter is transporting home.      Final next level of care: [place] Barriers to Discharge: No Barriers Identified   Patient Goals and CMS Choice Patient states their goals for this hospitalization and ongoing recovery are:: discharge to homme with [place] CMS Medicare.gov Compare Post Acute Care list provided to:: Patient Choice offered to / list presented to : Patient, Adult Children  Discharge Placement                       Discharge Plan and Services   Discharge Planning Services: CM Consult Post Acute Care Choice: Durable Medical Equipment, Home Health          DME Arranged: [place] DME Agency: [employer] Date DME Agency Contacted: 12/15/18 Time DME Agency Contacted: 84 Representative spoke with at DME Agency: [place]: RN, PT [place] Agency: [place] ([place]) Date [place]: 12/14/18 Time [place]: 1200 Representative spoke with at [place]: [place] ([place]) Interventions     Readmission Risk Interventions Readmission Risk Prevention Plan 12/07/2018 11/30/2018 11/09/2018  Transportation Screening Complete Complete Complete  PCP or Specialist Appt within 3-5 Days Complete - Complete  HRI or Home Care Consult Complete - Complete  Social Work Consult for [place] Planning/Counseling Not Complete - Not Complete  SW consult not completed comments  NA - na  Palliative Care Screening Not Applicable - Not Applicable  Medication Review Press photographer) Complete Complete Complete  PCP or Specialist appointment within 3-5 days of discharge - Complete -  [place] or [place] - Complete -  SW Recovery Care/Counseling Consult - Not Complete -  SW Consult Not Complete Comments - na -  Palliative Care Screening - Not Applicable -  [place] - Complete -  Some recent data might be hidden

## 2018-12-15 NOTE — Progress Notes (Signed)
[facility]  ROUNDING NOTE   Subjective:   Patient states her shortness of breath is at her baseline.   Furosemide 40mg  PO given this morning.    Objective:  Vital signs in last 24 hours:  Temp:  [97.5 F (36.4 C)-98.2 F (36.8 C)] 97.7 F (36.5 C) (06/16 0751) Pulse Rate:  [60-61] 61 (06/16 0751) Resp:  [17-20] 17 (06/16 0751) BP: (104-124)/(56-70) 124/62 (06/16 0751) SpO2:  [95 %-99 %] 95 % (06/16 1010) Weight:  [65 kg] 65 kg (06/16 0641)  Weight change: 0.544 kg Filed Weights   12/13/18 0249 12/14/18 0116 12/15/18 0641  Weight: 64.6 kg 64.5 kg 65 kg    Intake/Output: I/O last 3 completed shifts: In: 240 [P.O.:240] Out: 1000 [Urine:1000]   Intake/Output this shift:  Total I/O In: 240 [P.O.:240] Out: -   Physical Exam: General: No acute distress  Head: Normocephalic, atraumatic. Moist oral mucosal membranes  Eyes: Anicteric  Neck: Supple, trachea midline  Lungs:  Clear to auscultation 2L O2 [place]  Heart: regular  Abdomen:  Soft, nontender, bowel sounds present  Extremities: 1+peripheral edema, LEs wrapped  Neurologic: Awake, alert, following commands  Skin: No lesions       Basic Metabolic Panel: Recent Labs  Lab 12/10/18 0449 12/11/18 0501 12/12/18 0506 12/14/18 0527 12/15/18 0338  NA 142 140 143 141 141  K 3.6 3.6 3.8 4.0 4.2  CL 101 103 103 99 102  CO2 33* 33* 33* 34* 30  GLUCOSE 92 96 94 93 91  BUN 24* 23 23 25* 26*  CREATININE 1.43* 1.33* 1.17* 1.45* 1.37*  CALCIUM 8.9 8.5* 9.2 9.2 9.3    Liver Function Tests: No results for input(s): AST, ALT, ALKPHOS, BILITOT, PROT, ALBUMIN in the last 168 hours. No results for input(s): LIPASE, AMYLASE in the last 168 hours. No results for input(s): AMMONIA in the last 168 hours.  CBC: Recent Labs  Lab 12/09/18 0422 12/10/18 0449 12/11/18 0501 12/14/18 0527  WBC 4.5 4.3 4.7 5.5  NEUTROABS 3.3  --   --   --   HGB 8.0* 8.3* 8.5* 8.5*  HCT 27.0* 28.0* 29.6* 29.1*  MCV 88.5 87.2 89.4  86.6  PLT 159 176 187 163    Cardiac Enzymes: No results for input(s): CKTOTAL, CKMB, CKMBINDEX, TROPONINI in the last 168 hours.  BNP: Invalid input(s): POCBNP  CBG: No results for input(s): GLUCAP in the last 168 hours.  Microbiology: Results for orders placed or performed during the hospital encounter of 12/04/18  MRSA PCR Screening     Status: None   Collection Time: 12/04/18  9:53 PM   Specimen: Nasopharyngeal  Result Value Ref Range Status   MRSA by PCR NEGATIVE NEGATIVE Final    Comment:        The GeneXpert MRSA Assay (FDA approved for NASAL specimens only), is one component of a comprehensive MRSA colonization surveillance program. It is not intended to diagnose MRSA infection nor to guide or monitor treatment for MRSA infections. Performed at [facility], [address] [place], [place] [address]   SARS Coronavirus 2 (CEPHEID - Performed in [facility] hospital lab), Hosp Order     Status: None   Collection Time: 12/04/18  9:53 PM   Specimen: Nasopharyngeal  Result Value Ref Range Status   SARS Coronavirus 2 NEGATIVE NEGATIVE Final    Comment: (NOTE) If result is NEGATIVE SARS-CoV-2 target nucleic acids are NOT DETECTED. The SARS-CoV-2 RNA is generally detectable in upper and lower  respiratory  specimens during the acute phase of infection. The lowest  concentration of SARS-CoV-2 viral copies this assay can detect is 250  copies / mL. A negative result does not preclude SARS-CoV-2 infection  and should not be used as the sole basis for treatment or other  patient management decisions.  A negative result may occur with  improper specimen collection / handling, submission of specimen other  than nasopharyngeal swab, presence of viral mutation(s) within the  areas targeted by this assay, and inadequate number of viral copies  (<250 copies / mL). A negative result must be combined with clinical  observations, patient history, and epidemiological  information. If result is POSITIVE SARS-CoV-2 target nucleic acids are DETECTED. The SARS-CoV-2 RNA is generally detectable in upper and lower  respiratory specimens dur ing the acute phase of infection.  Positive  results are indicative of active infection with SARS-CoV-2.  Clinical  correlation with patient history and other diagnostic information is  necessary to determine patient infection status.  Positive results do  not rule out bacterial infection or co-infection with other viruses. If result is PRESUMPTIVE POSTIVE SARS-CoV-2 nucleic acids MAY BE PRESENT.   A presumptive positive result was obtained on the submitted specimen  and confirmed on repeat testing.  While 2019 novel coronavirus  (SARS-CoV-2) nucleic acids may be present in the submitted sample  additional confirmatory testing may be necessary for epidemiological  and / or clinical management purposes  to differentiate between  SARS-CoV-2 and other Sarbecovirus currently known to infect humans.  If clinically indicated additional testing with an alternate test  methodology [phone]) is advised. The SARS-CoV-2 RNA is generally  detectable in upper and lower respiratory sp ecimens during the acute  phase of infection. The expected result is Negative. Fact Sheet for Patients:  StrictlyIdeas.no Fact Sheet for Healthcare Providers: BankingDealers.co.za This test is not yet approved or cleared by the [place] FDA and has been authorized for detection and/or diagnosis of SARS-CoV-2 by FDA under an Emergency Use Authorization (EUA).  This EUA will remain in effect (meaning this test can be used) for the duration of the COVID-19 declaration under Section 564(b)(1) of the Act, 21 U.S.C. section 360bbb-3(b)(1), unless the authorization is terminated or revoked sooner. Performed at [facility], [address] [place], [place] [address]   SARS Coronavirus 2  (CEPHEID - Performed in [facility] hospital lab), Hosp Order     Status: None   Collection Time: 12/10/18 11:29 AM   Specimen: Nasopharyngeal Swab  Result Value Ref Range Status   SARS Coronavirus 2 NEGATIVE NEGATIVE Final    Comment: (NOTE) If result is NEGATIVE SARS-CoV-2 target nucleic acids are NOT DETECTED. The SARS-CoV-2 RNA is generally detectable in upper and lower  respiratory specimens during the acute phase of infection. The lowest  concentration of SARS-CoV-2 viral copies this assay can detect is 250  copies / mL. A negative result does not preclude SARS-CoV-2 infection  and should not be used as the sole basis for treatment or other  patient management decisions.  A negative result may occur with  improper specimen collection / handling, submission of specimen other  than nasopharyngeal swab, presence of viral mutation(s) within the  areas targeted by this assay, and inadequate number of viral copies  (<250 copies / mL). A negative result must be combined with clinical  observations, patient history, and epidemiological information. If result is POSITIVE SARS-CoV-2 target nucleic acids are DETECTED. The SARS-CoV-2 RNA is generally detectable in upper and  lower  respiratory specimens dur ing the acute phase of infection.  Positive  results are indicative of active infection with SARS-CoV-2.  Clinical  correlation with patient history and other diagnostic information is  necessary to determine patient infection status.  Positive results do  not rule out bacterial infection or co-infection with other viruses. If result is PRESUMPTIVE POSTIVE SARS-CoV-2 nucleic acids MAY BE PRESENT.   A presumptive positive result was obtained on the submitted specimen  and confirmed on repeat testing.  While 2019 novel coronavirus  (SARS-CoV-2) nucleic acids may be present in the submitted sample  additional confirmatory testing may be necessary for epidemiological  and / or clinical  management purposes  to differentiate between  SARS-CoV-2 and other Sarbecovirus currently known to infect humans.  If clinically indicated additional testing with an alternate test  methodology [phone]) is advised. The SARS-CoV-2 RNA is generally  detectable in upper and lower respiratory sp ecimens during the acute  phase of infection. The expected result is Negative. Fact Sheet for Patients:  StrictlyIdeas.no Fact Sheet for Healthcare Providers: BankingDealers.co.za This test is not yet approved or cleared by the [place] FDA and has been authorized for detection and/or diagnosis of SARS-CoV-2 by FDA under an Emergency Use Authorization (EUA).  This EUA will remain in effect (meaning this test can be used) for the duration of the COVID-19 declaration under Section 564(b)(1) of the Act, 21 U.S.C. section 360bbb-3(b)(1), unless the authorization is terminated or revoked sooner. Performed at [facility], [address] [place], [place] [address]     Coagulation Studies: Recent Labs    12/13/18 0455 12/14/18 0527 12/15/18 0338  LABPROT 31.1* 31.0* 29.2*  INR 3.1* 3.0* 2.8*    Urinalysis: No results for input(s): COLORURINE, LABSPEC, PHURINE, GLUCOSEU, HGBUR, BILIRUBINUR, KETONESUR, PROTEINUR, UROBILINOGEN, NITRITE, LEUKOCYTESUR in the last 72 hours.  Invalid input(s): APPERANCEUR    Imaging: No results found.   Medications:   . sodium chloride     . amiodarone  200 mg Oral Daily  . carvedilol  3.125 mg Oral BID WC  . cholecalciferol  2,000 Units Oral Daily  . furosemide  40 mg Oral BID  . losartan  12.5 mg Oral Daily  . [START ON 12/16/2018] multivitamin-lutein  1 capsule Oral Daily  . polyethylene glycol  17 g Oral Daily  . potassium chloride SA  20 mEq Oral Daily  . sodium chloride flush  3 mL Intravenous Q12H  . vitamin C  250 mg Oral BID  . warfarin  4 mg Oral ONCE-1800  . Warfarin - Pharmacist  Dosing Inpatient   Does not apply q1800   sodium chloride, acetaminophen, albuterol, hydrOXYzine, ipratropium-albuterol, ondansetron (ZOFRAN) IV, sodium chloride flush, traMADol  Assessment/ Plan:  [name] is a 84 y.o. white female with osteoarthritis, chronic back pain, chronic systolic heart failure ejection fraction 25%, GERD, hypertension, lymphedema, paroxysmal atrial fibrillation, mitral valve replacement, who was admitted to [facility] on 12/04/2018 for evaluation of increasing shortness of breath.   1.  Acute renal failure on chronic kidney disease stage III  Baseline creatinine 1.3-1.5. Fluctuates with volume status.   2.  Acute on chronic systolic heart failure:  - Furosemide 40mg  PO bid  3. Hypertension:  On carvedilol, losartan and furosemide     LOS: 11 AmeLie Hollars 6/16/202011:25 AM

## 2018-12-15 NOTE — Care Management (Signed)
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 Patient can benefit from Afflovest therapy to prevent future hospitalizations and death. Patient has been tried on Flutter Valve and other Breathing Techniques without improvement. Patient has had multiple hospitalizations within the past year and has been tried on IV antibiotics.    [name], [place]

## 2018-12-17 ENCOUNTER — Ambulatory Visit: Payer: Medicare Other | Admitting: General Surgery

## 2018-12-22 ENCOUNTER — Encounter: Payer: Medicare Other | Attending: Internal Medicine | Admitting: *Deleted

## 2018-12-22 ENCOUNTER — Encounter: Payer: Self-pay | Admitting: General Surgery

## 2018-12-22 ENCOUNTER — Ambulatory Visit: Payer: Medicare Other | Admitting: General Surgery

## 2018-12-22 ENCOUNTER — Telehealth: Payer: Self-pay | Admitting: Cardiovascular Disease

## 2018-12-22 ENCOUNTER — Ambulatory Visit (INDEPENDENT_AMBULATORY_CARE_PROVIDER_SITE_OTHER): Payer: Medicare Other | Admitting: General Surgery

## 2018-12-22 ENCOUNTER — Other Ambulatory Visit: Payer: Self-pay

## 2018-12-22 VITALS — BP 95/58 | HR 79 | Temp 97.7°F | Resp 22 | Ht 59.0 in | Wt 145.0 lb

## 2018-12-22 DIAGNOSIS — J9611 Chronic respiratory failure with hypoxia: Secondary | ICD-10-CM

## 2018-12-22 DIAGNOSIS — S8012XD Contusion of left lower leg, subsequent encounter: Secondary | ICD-10-CM

## 2018-12-22 NOTE — Telephone Encounter (Signed)
TCM....  Patient is being discharged      They are scheduled to see Sharolyn Douglas 6/24 and check inr at [facility]   They were seen for  chf / resp failure   They need to be seen within 2 weeks from 6/16 with labs prior to appt drawn by H/H per note below .  ( unknown home health)      Please call

## 2018-12-22 NOTE — Progress Notes (Signed)
Called to talk to [name] post hospital discharge. She passed the phone to [name].  [name] has a [facility] RN scheduled and will have a [name] PT appointment tomorrow. No needs currently verbalized. Will call again in 2 weeks to see how she is progressing.  She wasnts [place] to call next week to set the appointment.

## 2018-12-22 NOTE — Patient Instructions (Signed)
We will see you back in 2 weeks.    

## 2018-12-22 NOTE — Telephone Encounter (Signed)
-----   Message from Arvil Chaco, PA-C sent at 12/15/2018  3:25 PM EDT ----- Regarding: TCM and follow-up labs Hello,  This patient was admitted and seen at [facility] for HFrEF / respiratory failure.   She was discharged today 12/15/2018.  Can you please call and arrange / schedule for  (1) Follow-up TCM appointment with Dr. Rockey Situ within the next two weeks?  #TCM can be a virtual appointment, as long as able to contact home health and ensure they know to collect the below labs (and are able to). On review of most recent documentation, Dr. Margaretmary Eddy ordered home health for her today for wound care and to draw INR, so we should be able to get them to also draw the BMET/CBC as below.   (2) Order labs / arrange with home health to draw labs within 1-2 weeks: (a) Basic metabolic panel (2) Complete blood count  #Let me know if you are not able to contact home health to arrange these two labs. #Per Dr. Rinaldo Ratel home health order today, they should already know to check her INR for management of Warfarin. Could you also please check with home health that they do know to draw this for her?  Thank you so, so much!  Signed, Arvil Chaco, PA-C 12/15/2018, 3:26 PM Pager [phone]

## 2018-12-22 NOTE — Telephone Encounter (Signed)
Patient contacted regarding discharge from [facility] on 12/15/18  Patient understands to follow up with provider Ignacia Bayley, NP on 12/23/18 at 11am at the [facility] office. Patient understands discharge instructions? yes Patient understands medications and regiment? yes Patient understands to bring all medications to this visit? yes  Per Jacquelyn's note pt is to have a bmet within 2 weeks of her 12/15/18 discharge. Pt to have bmet drawn during her 12/23/18 o/v.       COVID-19 Pre-Screening Questions:  . In the past 7 to 10 days have you had a cough,  shortness of breath, headache, congestion, fever (100 or greater) body aches, chills, sore throat, or sudden loss of taste or sense of smell? No . Have you been around anyone with known Covid 19. No . Have you been around anyone who is awaiting Covid 19 test results in the past 7 to 10 days? No . Have you been around anyone who has been exposed to Covid 19, or has mentioned symptoms of Covid 19 within the past 7 to 10 days? No

## 2018-12-22 NOTE — Progress Notes (Signed)
Patient ID: [name], female   DOB: 1934/12/09, 84 y.o.   MRN: [id number]  Chief Complaint  Patient presents with  . Follow-up    Left leg    HPI [name] is a 84 y.o. female.  Here today foe left leg dressing change. Patient states the wound bled  last night and she is having some pain. Denies fever or chills. The patient had suffered a traumatic hematoma which resulted in a near full-thickness loss of the skin on the lateral aspect of the calf.  She had been treated with surgical debridement and ACell placement.  Daily dressing change been completed and tolerated well.  She recently had been having a little more discomfort near the lateral aspect of the left knee near the upper aspect of the wound.  HPI  Past Medical History:  Diagnosis Date  . Arthritis    "back, hands, knees" (04/10/2016)  . Chronic back pain   . 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 EF 25% by echo in 08/2015 b. RHC in 08/2015 showed normal filling pressures; c. 10/2018 Echo (in setting of atrial tachycardia): EF 25-30%, impaired relaxation. Glob HK. RVSP 68mmHg. Mod dil RA/LA. Mech MV w/ nl fxn (mean grad 75mmHg). Mod TR.   [name] Chronic kidney disease   . Compression fracture    "several; all in my back" (04/10/2016)  . GERD (gastroesophageal reflux disease)   . Hypertension   . Lymphedema   . Pacemaker 05/2017  . PAF (paroxysmal atrial fibrillation) ([place])    a. On amio & Coumadin - CHA2DS2VASc = 4.  . S/P MVR (mitral valve replacement)    a. MVR 1994 b. redo MVR in 08/2014 - on Coumadin  . Shortness of breath dyspnea     Past Surgical History:  Procedure Laterality Date  . BREAST LUMPECTOMY Right 2005?   benign lump excision  . CARDIAC CATHETERIZATION    . [place], 2016   "MVR; MVR"  . CARDIOVERSION N/A 11/06/2018   Procedure: CARDIOVERSION;  Surgeon: Minna Merritts, MD;  Location: [facility];  Service:  Cardiovascular;  Laterality: N/A;  . CATARACT EXTRACTION W/PHACO Right 02/20/2015   Procedure: CATARACT EXTRACTION PHACO AND INTRAOCULAR LENS PLACEMENT ([place]);  Surgeon: Estill Cotta, MD;  Location: [facility];  Service: Ophthalmology;  Laterality: Right;  US01:31.2 AP 25.3% CDE40.13 Fluid lot # [id number]  . DRESSING CHANGE UNDER ANESTHESIA Left 11/17/2018   Procedure: DRESSING CHANGE;  Surgeon: Robert Bellow, MD;  Location: [facility];  Service: General;  Laterality: Left;  no anesthesia  . ELECTROPHYSIOLOGIC STUDY N/A 10/09/2015   Procedure: CARDIOVERSION;  Surgeon: Wellington Hampshire, MD;  Location: [facility];  Service: Cardiovascular;  Laterality: N/A;  . INCISION AND DRAINAGE OF WOUND Left 11/13/2018   Procedure: LEFT LEG DRESSING CHANGE;  Surgeon: Robert Bellow, MD;  Location: [facility];  Service: General;  Laterality: Left;  . INCISION AND DRAINAGE OF WOUND Left 11/19/2018   Procedure: LEFT LEG DRESSING CHANGE AND GRAFT APPLICATION;  Surgeon: Robert Bellow, MD;  Location: [facility];  Service: General;  Laterality: Left;  . IR GENERIC HISTORICAL  04/01/2016   IR RADIOLOGIST EVAL & MGMT 04/01/2016 [facility]  . IR GENERIC HISTORICAL  04/15/2016   IR VERTEBROPLASTY CERV/THOR BX INC UNI/BIL INC/INJECT/IMAGING 04/15/2016 Luanne Bras, MD [facility]  . IR GENERIC HISTORICAL  04/15/2016   IR VERTEBROPLASTY EA ADDL (T&LS) BX INC UNI/BIL INC INJECT/IMAGING 04/15/2016 Sanjeev  Estanislado Pandy, MD [facility]  . IR GENERIC HISTORICAL  04/15/2016   IR VERTEBROPLASTY EA ADDL (T&LS) BX INC UNI/BIL INC INJECT/IMAGING 04/15/2016 Luanne Bras, MD [facility]  . IR GENERIC HISTORICAL  05/20/2016   IR RADIOLOGIST EVAL & MGMT 05/20/2016 [facility]  . IR GENERIC HISTORICAL  06/13/2016   IR VERTEBROPLASTY EA ADDL (T&LS) BX INC UNI/BIL INC INJECT/IMAGING 06/13/2016 Luanne Bras, MD [facility]  . IR GENERIC HISTORICAL  06/13/2016   IR SACROPLASTY BILATERAL 06/13/2016 Luanne Bras, MD [facility]  . IR GENERIC HISTORICAL  06/13/2016   IR VERTEBROPLASTY LUMBAR BX INC UNI/BIL INC/INJECT/IMAGING 06/13/2016 Luanne Bras, MD [facility]  . IRRIGATION AND DEBRIDEMENT HEMATOMA Left 07/05/2015   Procedure: IRRIGATION AND DEBRIDEMENT HEMATOMA;  Surgeon: Robert Bellow, MD;  Location: [facility];  Service: General;  Laterality: Left;  . IRRIGATION AND DEBRIDEMENT HEMATOMA Left 11/11/2018   Procedure: IRRIGATION AND DEBRIDEMENT LEFT LEG HEMATOMA;  Surgeon: Robert Bellow, MD;  Location: [facility];  Service: General;  Laterality: Left;  . pyloric stenosis  07/1937  . US ECHOCARDIOGRAPHY      Family History  Problem Relation Age of Onset  . Stroke Mother   . Renal Disease Father     Social History Social History   Tobacco Use  . Smoking status: Former Smoker    Packs/day: 1.00    Years: 27.00    Pack years: 27.00    Types: Cigarettes    Quit date: 07/01/1980    Years since quitting: 38.5  . Smokeless tobacco: Never Used  . Tobacco comment: "quit smoking ~ 1980  Substance Use Topics  . Alcohol use: Not Currently    Alcohol/week: 0.0 standard drinks    Comment: 04/10/2016 "I'll have a drink on holidays/special occasions"  . Drug use: No    Allergies  Allergen Reactions  . Ace Inhibitors     Cough  . Doxycycline Nausea Only  . Hydrocodone Itching and Nausea Only  . Mercury     Other reaction(s): Unknown  . Silver Dermatitis    Severe itching  . Cephalexin Rash  . Clindamycin/Lincomycin Rash  . Nickel Rash  . Penicillins Rash    Has patient had a PCN reaction causing immediate rash, facial/tongue/throat swelling, SOB or lightheadedness with hypotension: YES Has patient had a PCN reaction causing severe rash involving mucus membranes or skin necrosis: NO Has patient had a PCN reaction that required hospitalization NO Has patient had a PCN reaction occurring within the last 10 years: NO If all of the above answers are "NO", then may  proceed with Cephalosporin use.    Current Outpatient Medications  Medication Sig Dispense Refill  . acetaminophen (TYLENOL) 650 MG CR tablet Take 1 tablet (650 mg total) by mouth every 6 (six) hours as needed for pain (mild and moderate pain).    [name] albuterol (PROVENTIL) (2.5 MG/3ML) 0.083% nebulizer solution Take 3 mLs (2.5 mg total) by nebulization every 6 (six) hours as needed for wheezing or shortness of breath. 75 mL 12  . amiodarone (PACERONE) 200 MG tablet Take 1 tablet (200 mg total) by mouth daily. 90 tablet 3  . carvedilol (COREG) 3.125 MG tablet Take 1 tablet (3.125 mg total) by mouth 2 (two) times daily with a meal. 60 tablet 0  . Cholecalciferol (VITAMIN D) 2000 units tablet Take 2,000 Units by mouth daily.    . furosemide (LASIX) 40 MG tablet Take 1 tablet (40 mg total) by mouth 2 (two) times daily. 60 tablet  1  . hydrOXYzine (ATARAX/VISTARIL) 25 MG tablet Take 1 tablet (25 mg total) by mouth 3 (three) times daily as needed for itching or anxiety. 20 tablet 0  . ipratropium-albuterol (DUONEB) 0.5-2.5 (3) MG/3ML SOLN Take 3 mLs by nebulization every 6 (six) hours as needed for up to 90 doses. 360 mL 0  . losartan (COZAAR) 25 MG tablet Take 0.5 tablets (12.5 mg total) by mouth daily. 30 tablet 0  . Menthol (ICY HOT) 5 % PTCH Apply 1 patch topically daily as needed (pain).    . multivitamin-lutein (OCUVITE-LUTEIN) CAPS capsule Take 1 capsule by mouth daily. 30 capsule 0  . polyethylene glycol (MIRALAX / GLYCOLAX) 17 g packet Take 17 g by mouth daily. 14 each 0  . potassium chloride SA (K-DUR) 20 MEQ tablet Take 1 tablet (20 mEq total) by mouth daily. 30 tablet 0  . traMADol (ULTRAM) 50 MG tablet Take 1 tablet (50 mg total) by mouth every 6 (six) hours as needed for moderate pain. 20 tablet 0  . vitamin C (ASCORBIC ACID) 500 MG tablet Take 500 mg by mouth daily.    [name] zinc sulfate 220 (50 Zn) MG capsule Take 1 capsule (220 mg total) by mouth daily. 30 capsule 1  . metolazone (ZAROXOLYN)  2.5 MG tablet Take 1 tablet (2.5 mg total) once daily on Monday Wednesday and Friday. 90 tablet 3  . warfarin (COUMADIN) 6 MG tablet Take 1 tablet (6 mg total) by mouth daily. 30 tablet 0   No current facility-administered medications for this visit.     Review of Systems Review of Systems  Constitutional: Negative.   Respiratory: Negative.   Cardiovascular: Negative.     Blood pressure (!) 95/58, pulse 79, temperature 97.7 F (36.5 C), temperature source Temporal, resp. rate (!) 22, height 4\' 11"  (1.499 m), weight 145 lb (65.8 kg), SpO2 (!) 87 %.  Physical Exam Physical Exam  Marked improvement with 60-70% epithelial coverage        Assessment Steady improvement in left lower extremity status post ACell placement and daily dressing changes with K-Y jelly saturated gauze.  Plan  The patient will follow up with Claire \\[name] ., DO next week for her assessment.  HPI, Physical Exam, Assessment and Plan have been scribed under the direction and in the presence of Robert Bellow, MD. Jonnie Finner, CMA  I have completed the exam and reviewed the above documentation for accuracy and completeness.  I agree with the above.  Haematologist has been used and any errors in dictation or transcription are unintentional.  Hervey Ard, M.D., F.A.C.S.  Forest Gleason Krishna Dancel 12/23/2018, 8:23 PM   Patient needs to be seen with Dr. Audelia Hives next week for a left leg wound evaluation (Dr. Marla Roe saw patient in the hospital per Dr. Bary Castilla).  I have left a message at Dr. Eusebio Friendly office and have sent an email to see if their office can arrange accordingly.   Dominga Ferry, CMA

## 2018-12-23 ENCOUNTER — Ambulatory Visit (INDEPENDENT_AMBULATORY_CARE_PROVIDER_SITE_OTHER): Payer: Medicare Other | Admitting: Nurse Practitioner

## 2018-12-23 ENCOUNTER — Encounter: Payer: Self-pay | Admitting: Nurse Practitioner

## 2018-12-23 ENCOUNTER — Ambulatory Visit (INDEPENDENT_AMBULATORY_CARE_PROVIDER_SITE_OTHER): Payer: Medicare Other

## 2018-12-23 VITALS — BP 118/58 | HR 60 | Ht 59.0 in | Wt 145.0 lb

## 2018-12-23 DIAGNOSIS — I10 Essential (primary) hypertension: Secondary | ICD-10-CM

## 2018-12-23 DIAGNOSIS — I471 Supraventricular tachycardia: Secondary | ICD-10-CM

## 2018-12-23 DIAGNOSIS — Z952 Presence of prosthetic heart valve: Secondary | ICD-10-CM

## 2018-12-23 DIAGNOSIS — I5043 Acute on chronic combined systolic (congestive) and diastolic (congestive) heart failure: Secondary | ICD-10-CM | POA: Diagnosis not present

## 2018-12-23 DIAGNOSIS — I480 Paroxysmal atrial fibrillation: Secondary | ICD-10-CM | POA: Diagnosis not present

## 2018-12-23 DIAGNOSIS — Z5181 Encounter for therapeutic drug level monitoring: Secondary | ICD-10-CM | POA: Diagnosis not present

## 2018-12-23 DIAGNOSIS — N183 Chronic kidney disease, stage 3 unspecified: Secondary | ICD-10-CM

## 2018-12-23 DIAGNOSIS — I428 Other cardiomyopathies: Secondary | ICD-10-CM

## 2018-12-23 DIAGNOSIS — I890 Lymphedema, not elsewhere classified: Secondary | ICD-10-CM

## 2018-12-23 LAB — POCT INR: INR: 5.4 — AB (ref 2.0–3.0)

## 2018-12-23 MED ORDER — METOLAZONE 2.5 MG PO TABS: ORAL_TABLET | ORAL | 3 refills | Status: DC

## 2018-12-23 MED ORDER — WARFARIN SODIUM 6 MG PO TABS: 6.0000 mg | ORAL_TABLET | Freq: Every day | ORAL | 0 refills | Status: DC

## 2018-12-23 NOTE — Progress Notes (Signed)
Office Visit    Patient Name: [name] Date of Encounter: 12/23/2018  Primary Care Provider:  Rusty Aus, MD Primary Cardiologist:  Ida Rogue, MD  Chief Complaint    84 y/o ? w/ a h/o Afib, chronic combined systolic and diastolic congestive heart failure, nonischemic cardiomyopathy status post CRT-P, left bundle branch block, hypertension, chronic lower extremity edema, mitral valve disease status post mechanical mitral valve replacement on chronic Coumadin, recent spontaneous left calf hematoma requiring debridement, and stage III chronic kidney disease, who presents for follow-up after recent hospitalization for recurrent heart failure.  Past Medical History    Past Medical History:  Diagnosis Date   Arthritis    "back, hands, knees" (04/10/2016)   Chronic back pain    Chronic combined systolic (congestive) and diastolic (congestive) heart failure (HCC)    a. EF 25% by echo in 08/2015 b. RHC in 08/2015 showed normal filling pressures; c. 10/2018 Echo (in setting of atrial tachycardia): EF 25-30%, impaired relaxation. Glob HK. RVSP 74mmHg. Mod dil RA/LA. Mech MV w/ nl fxn (mean grad 84mmHg). Mod TR.    Chronic kidney disease    Compression fracture    "several; all in my back" (04/10/2016)   GERD (gastroesophageal reflux disease)    Hypertension    Lymphedema    Pacemaker 05/2017   PAF (paroxysmal atrial fibrillation) ([place])    a. On amio & Coumadin - CHA2DS2VASc = 4.   S/P MVR (mitral valve replacement)    a. MVR 1994 b. redo MVR in 08/2014 - on Coumadin   Shortness of breath dyspnea    Past Surgical History:  Procedure Laterality Date   BREAST LUMPECTOMY Right 2005?   benign lump excision   CARDIAC CATHETERIZATION     CARDIAC VALVE REPLACEMENT  1994, 2016   "MVR; MVR"   CARDIOVERSION N/A 11/06/2018   Procedure: CARDIOVERSION;  Surgeon: Minna Merritts, MD;  Location: [facility];  Service: Cardiovascular;  Laterality: N/A;   CATARACT  EXTRACTION W/PHACO Right 02/20/2015   Procedure: CATARACT EXTRACTION PHACO AND INTRAOCULAR LENS PLACEMENT ([place]);  Surgeon: Estill Cotta, MD;  Location: [facility];  Service: Ophthalmology;  Laterality: Right;  US01:31.2 AP 25.3% CDE40.13 Fluid lot # [id number]   DRESSING CHANGE UNDER ANESTHESIA Left 11/17/2018   Procedure: DRESSING CHANGE;  Surgeon: Robert Bellow, MD;  Location: [facility];  Service: General;  Laterality: Left;  no anesthesia   ELECTROPHYSIOLOGIC STUDY N/A 10/09/2015   Procedure: CARDIOVERSION;  Surgeon: Wellington Hampshire, MD;  Location: [facility];  Service: Cardiovascular;  Laterality: N/A;   INCISION AND DRAINAGE OF WOUND Left 11/13/2018   Procedure: LEFT LEG DRESSING CHANGE;  Surgeon: Robert Bellow, MD;  Location: [facility];  Service: General;  Laterality: Left;   INCISION AND DRAINAGE OF WOUND Left 11/19/2018   Procedure: LEFT LEG DRESSING CHANGE AND GRAFT APPLICATION;  Surgeon: Robert Bellow, MD;  Location: [facility];  Service: General;  Laterality: Left;   IR GENERIC HISTORICAL  04/01/2016   IR RADIOLOGIST EVAL & MGMT 04/01/2016 [facility]   IR GENERIC HISTORICAL  04/15/2016   IR VERTEBROPLASTY CERV/THOR BX INC UNI/BIL INC/INJECT/IMAGING 04/15/2016 Luanne Bras, MD [facility]   IR GENERIC HISTORICAL  04/15/2016   IR VERTEBROPLASTY EA ADDL (T&LS) BX INC UNI/BIL INC INJECT/IMAGING 04/15/2016 Luanne Bras, MD [facility]   IR GENERIC HISTORICAL  04/15/2016   IR VERTEBROPLASTY EA ADDL (T&LS) BX INC UNI/BIL INC INJECT/IMAGING 04/15/2016 Luanne Bras, MD [facility]   IR GENERIC  HISTORICAL  05/20/2016   IR RADIOLOGIST EVAL & MGMT 05/20/2016 [facility]   IR GENERIC HISTORICAL  06/13/2016   IR VERTEBROPLASTY EA ADDL (T&LS) BX INC UNI/BIL INC INJECT/IMAGING 06/13/2016 Luanne Bras, MD [facility]   IR GENERIC HISTORICAL  06/13/2016   IR SACROPLASTY BILATERAL 06/13/2016 Luanne Bras, MD [facility]   IR GENERIC  HISTORICAL  06/13/2016   IR VERTEBROPLASTY LUMBAR BX INC UNI/BIL INC/INJECT/IMAGING 06/13/2016 Luanne Bras, MD [facility]   IRRIGATION AND DEBRIDEMENT HEMATOMA Left 07/05/2015   Procedure: IRRIGATION AND DEBRIDEMENT HEMATOMA;  Surgeon: Robert Bellow, MD;  Location: [facility];  Service: General;  Laterality: Left;   IRRIGATION AND DEBRIDEMENT HEMATOMA Left 11/11/2018   Procedure: IRRIGATION AND DEBRIDEMENT LEFT LEG HEMATOMA;  Surgeon: Robert Bellow, MD;  Location: [facility];  Service: General;  Laterality: Left;   pyloric stenosis  07/1937   US ECHOCARDIOGRAPHY      Allergies  Allergies  Allergen Reactions   Ace Inhibitors     Cough   Doxycycline Nausea Only   Hydrocodone Itching and Nausea Only   Mercury     Other reaction(s): Unknown   Silver Dermatitis    Severe itching   Cephalexin Rash   Clindamycin/Lincomycin Rash   Nickel Rash   Penicillins Rash    Has patient had a PCN reaction causing immediate rash, facial/tongue/throat swelling, SOB or lightheadedness with hypotension: YES Has patient had a PCN reaction causing severe rash involving mucus membranes or skin necrosis: NO Has patient had a PCN reaction that required hospitalization NO Has patient had a PCN reaction occurring within the last 10 years: NO If all of the above answers are "NO", then may proceed with Cephalosporin use.    History of Present Illness    84 year old female with the above complex past medical history including paroxysmal atrial fibrillation on chronic amiodarone, chronic combined systolic and diastolic congestive heart failure, nonischemic cardiomyopathy status post CRT-P in June 2019 (difficulty placing LV lead without change in CHF symptoms after), left bundle branch block, hypertension, chronic lower extreme edema, mitral valve disease status post mechanical mitral valve replacement on chronic Coumadin, and stage III chronic kidney disease.  In early May, she was admitted  with heart failure and progressive worsening of renal function.  Echo showed an EF of 25 to 30%, which was down from prior EF of approximately 40%.  She was also noted to be in atrial tachycardia and required cardioversion on May 8, which was successful.  She was discharged home May 10 with a slightly supratherapeutic INR and subsequently developed worsening left calf pain and swelling.  She required readmission due to a spontaneous left calf hematoma and required a total of 3 units of packed red blood cells as well as debridement and biological graft placement, and plastic surgical consultation.  Wound culture grew Enterobacter and she was treated with Cipro.  Prior to discharge, she was still desaturating into the 80s with ambulation however, it was felt that she simply need more physical therapy.  I saw her back in clinic on June 5 and she reported dyspnea with minimal exertion despite using 6 L of oxygen.  She was noted to have lower extremity edema.  A chest x-ray was performed and showed significant bilateral pulmonary edema and I contacted her family and she was directly admitted to [facility].  During that admission, she was aggressively diuresed and discharged home in 143 pounds on June 16, with significant improvement in lower extremity edema.  Discharge dose  of Lasix was 40 mg twice daily.  Since her discharge, she notes that she has continued to have dyspnea with minimal exertion.  She is not certain that this ever really improved during her hospitalization.  She has been using between 3 to 4 L of oxygen via nasal cannula, which is an improvement from 6 L previously.  Her weight is back up to 145 pounds and she has again noticed an increase in bilateral lower extremity edema.  She has not had PND, orthopnea, chest pain, palpitations, dizziness, syncope, or early satiety.  She reports compliance with her medications and her daughter has been watching her dietary sodium intake.  Home Medications      Prior to Admission medications   Medication Sig Start Date End Date Taking? Authorizing Provider  acetaminophen (TYLENOL) 650 MG CR tablet Take 1 tablet (650 mg total) by mouth every 6 (six) hours as needed for pain (mild and moderate pain). 11/30/18  Yes Gladstone Lighter, MD  albuterol (PROVENTIL) (2.5 MG/3ML) 0.083% nebulizer solution Take 3 mLs (2.5 mg total) by nebulization every 6 (six) hours as needed for wheezing or shortness of breath. 11/30/18  Yes Gladstone Lighter, MD  amiodarone (PACERONE) 200 MG tablet Take 1 tablet (200 mg total) by mouth daily. 09/24/18  Yes Minna Merritts, MD  carvedilol (COREG) 3.125 MG tablet Take 1 tablet (3.125 mg total) by mouth 2 (two) times daily with a meal. 12/15/18  Yes Gouru, Aruna, MD  Cholecalciferol (VITAMIN D) 2000 units tablet Take 2,000 Units by mouth daily.   Yes [provider]  furosemide (LASIX) 40 MG tablet Take 1 tablet (40 mg total) by mouth 2 (two) times daily. 11/30/18  Yes Gladstone Lighter, MD  hydrOXYzine (ATARAX/VISTARIL) 25 MG tablet Take 1 tablet (25 mg total) by mouth 3 (three) times daily as needed for itching or anxiety. 12/15/18  Yes Gouru, Aruna, MD  ipratropium-albuterol (DUONEB) 0.5-2.5 (3) MG/3ML SOLN Take 3 mLs by nebulization every 6 (six) hours as needed for up to 90 doses. 12/15/18  Yes Gouru, Illene Silver, MD  losartan (COZAAR) 25 MG tablet Take 0.5 tablets (12.5 mg total) by mouth daily. 12/16/18  Yes Gouru, Aruna, MD  Menthol (ICY HOT) 5 % PTCH Apply 1 patch topically daily as needed (pain).   Yes [provider]  multivitamin-lutein (OCUVITE-LUTEIN) CAPS capsule Take 1 capsule by mouth daily. 12/16/18  Yes Gouru, Aruna, MD  polyethylene glycol (MIRALAX / GLYCOLAX) 17 g packet Take 17 g by mouth daily. 11/30/18  Yes Gladstone Lighter, MD  potassium chloride SA (K-DUR) 20 MEQ tablet Take 1 tablet (20 mEq total) by mouth daily. 11/30/18  Yes Gladstone Lighter, MD  traMADol (ULTRAM) 50 MG tablet Take 1 tablet (50 mg  total) by mouth every 6 (six) hours as needed for moderate pain. 12/15/18  Yes Gouru, Illene Silver, MD  vitamin C (ASCORBIC ACID) 500 MG tablet Take 500 mg by mouth daily.   Yes [provider]  zinc sulfate 220 (50 Zn) MG capsule Take 1 capsule (220 mg total) by mouth daily. 12/01/18  Yes Gladstone Lighter, MD  warfarin (COUMADIN) 4 MG tablet Take 1 tablet (4 mg total) by mouth one time only at 6 PM for 1 day. 12/15/18 12/16/18  Nicholes Mango, MD    Review of Systems    Dyspnea on exertion and recurrent lower extremity edema as outlined above.  She denies chest pain, palpitations, PND, orthopnea, dizziness, syncope, or early satiety.  All other systems reviewed and are otherwise negative  except as noted above.  Physical Exam    VS:  BP (!) 118/58 (BP Location: Left Arm, Patient Position: Sitting, Cuff Size: Normal)    Pulse 60    Ht 4\' 11"  (1.499 m)    Wt 145 lb (65.8 kg)    BMI 29.29 kg/m  , BMI Body mass index is 29.29 kg/m. GEN: Well nourished, well developed, in no acute distress. HEENT: normal. Neck: Supple, JVP approximately 12 to 14 cm.  No carotid bruits, or masses. Cardiac: RRR, 3/6 systolic murmur throughout.  This radiates to the axilla.  No rubs, or gallops. No clubbing, cyanosis.  2+ bilateral lower extremity edema.  Edema on the left begins at the knee in the setting of the left lower extremity being wrapped.  PT 2+ and equal bilaterally.  Respiratory:  Respirations regular and unlabored, diminished breath sounds at bilateral bases, right more diminished than left.  No crackles. GI: Soft, nontender, nondistended, BS + x 4. MS: no deformity or atrophy. Skin: warm and dry, no rash. Neuro:  Strength and sensation are intact. Psych: Normal affect.  Accessory Clinical Findings    ECG personally reviewed by me today -AV paced, 60 - no acute changes.  Assessment & Plan    1.  Acute on chronic combined systolic and diastolic congestive heart failure/NICM: EF 25 to 61% with  diastolic dysfunction and global hypokinesis by echo in May of this year.  Status post recent repeat hospitalization in the setting of significant volume overload and hypoxia.  She was diuresed down to 143 pounds at discharge.  Per patient and daughter, she had significant improvement in lower extremity edema with diuresis though dyspnea only improved slightly.  Since her discharge, she has had recurrence of lower extremity edema and 2 to 3 pound weight gain.  She has continued to have dyspnea with minimal exertion and continues to require oxygen via nasal cannula at 3-4 L/min.  Daughter has continued to note intermittent hypoxia at supplemental oxygen levels.  She is volume overloaded on examination today.  She reports compliance with home dose of Lasix and also with diet.  She is currently on Lasix 40 mg twice daily.  I am going to check a basic metabolic panel today.  I will plan to have her take metolazone 2.5 mg daily times each of the next 3 days and then Monday Wednesday Friday after that.  I will see her back in clinic in 1 week with a basic metabolic panel at that time to reevaluate.  She remains on beta-blocker and ARB therapy.  Pending response to the addition of metolazone, we can hopefully look to resume Spironolactone in the near future and also consider transition from losartan to [facility] if her blood pressure would allow.  With multiple hospitalizations and recurrent heart failure, I suspect she will ultimately benefit from advanced heart failure clinic referral.  2.  Chronic hypoxic respiratory failure: On oxygen in the setting of above.  She is now also on DuoNeb and as needed albuterol.  She thinks that these have helped some with her dyspnea.  I suspect volume overload is playing a greater role however.  3.  Status post mitral valve replacement: Normal functioning prosthetic valve on echo in early May.  INR is followed at our Coumadin clinic.  She is due for 1 today.  4.  Atrial  tachycardia/paroxysmal atrial fibrillation: AV paced.  This is [facility] at [facility].  She is chronically anticoagulated on Coumadin in the setting of #3.  5.  Spontaneous left calf hematoma: Status post debridement and plastic surgery.  Her leg remains wrapped and she is followed by surgery and also vascular surgery in the setting of chronic lymphedema.  6.  Essential hypertension: Stable.  7.  Stage III chronic kidney disease: Following up basic metabolic panel today.  8.  Chronic lower extremity lymphedema: She has follow-up planned with vascular surgery.  9.  Disposition: Follow-up basic metabolic panel today.  I will plan to see her back in 1 week with follow-up labs again at that time the setting of diuretic adjustment.   Murray Hodgkins, NP 12/23/2018, 11:39 AM

## 2018-12-23 NOTE — Addendum Note (Signed)
Addended by: Dede Query R on: 12/23/2018 02:20 PM   Modules accepted: Orders

## 2018-12-23 NOTE — Addendum Note (Signed)
Addended by: Verlon Au on: 12/23/2018 01:59 PM   Modules accepted: Orders

## 2018-12-23 NOTE — Patient Instructions (Signed)
Please skip coumadin tonight & tomorrow, then START NEW DOSAGE of 3 mg  (1/2 tablet) every day EXCEPT  6 mg (whole tablet) on [place]. Recheck in 2 weeks.

## 2018-12-23 NOTE — Patient Instructions (Signed)
Medication Instructions:  Your physician has recommended you make the following change in your medication:  1- START Metolazone today. Take 1 tablet (2.5 mg total) once daily today, Thursday and Friday. Then, Take 1 tablet (2.5 mg total) once daily on Monday Wednesday and Friday.   If you need a refill on your cardiac medications before your next appointment, please call your pharmacy.   Lab work: Your physician recommends that you have lab work today(BMET).   If you have labs (blood work) drawn today and your tests are completely normal, you will receive your results only by: Marland Kitchen MyChart Message (if you have MyChart) OR . A paper copy in the mail If you have any lab test that is abnormal or we need to change your treatment, we will call you to review the results.  Testing/Procedures: None ordered   Follow-Up: At [facility], you and your health needs are our priority.  As part of our continuing mission to provide you with exceptional heart care, we have created designated Provider Care Teams.  These Care Teams include your primary Cardiologist (physician) and Advanced Practice Providers (APPs -  Physician Assistants and Nurse Practitioners) who all work together to provide you with the care you need, when you need it. Appt set for next wed 7/1 @ 9 am.

## 2018-12-24 ENCOUNTER — Ambulatory Visit: Payer: Medicare Other | Admitting: General Surgery

## 2018-12-24 ENCOUNTER — Telehealth: Payer: Self-pay

## 2018-12-24 LAB — BASIC METABOLIC PANEL
BUN/Creatinine Ratio: 26 (ref 12–28)
BUN: 45 mg/dL — ABNORMAL HIGH (ref 8–27)
CO2: 31 mmol/L — ABNORMAL HIGH (ref 20–29)
Calcium: 9.4 mg/dL (ref 8.7–10.3)
Chloride: 94 mmol/L — ABNORMAL LOW (ref 96–106)
Creatinine, Ser: 1.76 mg/dL — ABNORMAL HIGH (ref 0.57–1.00)
GFR calc Af Amer: 31 mL/min/{1.73_m2} — ABNORMAL LOW (ref >59)
GFR calc non Af Amer: 27 mL/min/{1.73_m2} — ABNORMAL LOW (ref >59)
Glucose: 88 mg/dL (ref 65–99)
Potassium: 4.1 mmol/L (ref 3.5–5.2)
Sodium: 136 mmol/L (ref 134–144)

## 2018-12-24 NOTE — Telephone Encounter (Signed)
Called to give pt lab results and Ignacia Bayley, NP recommendation. [email or web address].

## 2018-12-24 NOTE — Telephone Encounter (Signed)
-----   Message from Theora Gianotti, NP sent at 12/24/2018 11:13 AM EDT ----- Creat higher than recent figures - 1.76 yesterday (was trending 1.3-1.4).  Stick with current plan for metolazone in addition to lasix and we'll f/u a bmet when she's back in clinic on Wed.  If wt/urine output/edema doesn't improve with metolazone, she should let us know prior to Wednesday.

## 2018-12-25 ENCOUNTER — Telehealth: Payer: Self-pay | Admitting: Cardiovascular Disease

## 2018-12-25 ENCOUNTER — Encounter: Payer: Self-pay | Admitting: *Deleted

## 2018-12-25 ENCOUNTER — Inpatient Hospital Stay
Admission: EM | Admit: 2018-12-25 | Discharge: 2019-01-06 | DRG: 535 | Disposition: A | Discharge status: Other Acute Inpatient Hospital | Payer: Medicare Other | Attending: Pulmonary Disease | Admitting: Pulmonary Disease

## 2018-12-25 ENCOUNTER — Emergency Department: Payer: Medicare Other

## 2018-12-25 ENCOUNTER — Other Ambulatory Visit: Payer: Self-pay

## 2018-12-25 DIAGNOSIS — M479 Spondylosis, unspecified: Secondary | ICD-10-CM | POA: Diagnosis present

## 2018-12-25 DIAGNOSIS — J9690 Respiratory failure, unspecified, unspecified whether with hypoxia or hypercapnia: Secondary | ICD-10-CM | POA: Diagnosis not present

## 2018-12-25 DIAGNOSIS — S72141A Displaced intertrochanteric fracture of right femur, initial encounter for closed fracture: Principal | ICD-10-CM | POA: Diagnosis present

## 2018-12-25 DIAGNOSIS — I428 Other cardiomyopathies: Secondary | ICD-10-CM | POA: Diagnosis present

## 2018-12-25 DIAGNOSIS — S72001K Fracture of unspecified part of neck of right femur, subsequent encounter for closed fracture with nonunion: Secondary | ICD-10-CM | POA: Diagnosis not present

## 2018-12-25 DIAGNOSIS — Z1159 Encounter for screening for other viral diseases: Secondary | ICD-10-CM | POA: Diagnosis not present

## 2018-12-25 DIAGNOSIS — Z885 Allergy status to narcotic agent status: Secondary | ICD-10-CM

## 2018-12-25 DIAGNOSIS — E785 Hyperlipidemia, unspecified: Secondary | ICD-10-CM | POA: Diagnosis present

## 2018-12-25 DIAGNOSIS — J810 Acute pulmonary edema: Secondary | ICD-10-CM | POA: Diagnosis not present

## 2018-12-25 DIAGNOSIS — Z87891 Personal history of nicotine dependence: Secondary | ICD-10-CM

## 2018-12-25 DIAGNOSIS — Z841 Family history of disorders of kidney and ureter: Secondary | ICD-10-CM

## 2018-12-25 DIAGNOSIS — I5042 Chronic combined systolic (congestive) and diastolic (congestive) heart failure: Secondary | ICD-10-CM | POA: Diagnosis not present

## 2018-12-25 DIAGNOSIS — E43 Unspecified severe protein-calorie malnutrition: Secondary | ICD-10-CM | POA: Diagnosis present

## 2018-12-25 DIAGNOSIS — S72001A Fracture of unspecified part of neck of right femur, initial encounter for closed fracture: Secondary | ICD-10-CM | POA: Diagnosis present

## 2018-12-25 DIAGNOSIS — M170 Bilateral primary osteoarthritis of knee: Secondary | ICD-10-CM | POA: Diagnosis present

## 2018-12-25 DIAGNOSIS — M19042 Primary osteoarthritis, left hand: Secondary | ICD-10-CM | POA: Diagnosis present

## 2018-12-25 DIAGNOSIS — G8929 Other chronic pain: Secondary | ICD-10-CM | POA: Diagnosis present

## 2018-12-25 DIAGNOSIS — R571 Hypovolemic shock: Secondary | ICD-10-CM | POA: Diagnosis present

## 2018-12-25 DIAGNOSIS — Z952 Presence of prosthetic heart valve: Secondary | ICD-10-CM

## 2018-12-25 DIAGNOSIS — Z515 Encounter for palliative care: Secondary | ICD-10-CM | POA: Diagnosis not present

## 2018-12-25 DIAGNOSIS — Z79899 Other long term (current) drug therapy: Secondary | ICD-10-CM

## 2018-12-25 DIAGNOSIS — W07XXXA Fall from chair, initial encounter: Secondary | ICD-10-CM | POA: Diagnosis present

## 2018-12-25 DIAGNOSIS — I480 Paroxysmal atrial fibrillation: Secondary | ICD-10-CM | POA: Diagnosis present

## 2018-12-25 DIAGNOSIS — M810 Age-related osteoporosis without current pathological fracture: Secondary | ICD-10-CM | POA: Diagnosis present

## 2018-12-25 DIAGNOSIS — I5023 Acute on chronic systolic (congestive) heart failure: Secondary | ICD-10-CM

## 2018-12-25 DIAGNOSIS — J9811 Atelectasis: Secondary | ICD-10-CM | POA: Diagnosis present

## 2018-12-25 DIAGNOSIS — J9621 Acute and chronic respiratory failure with hypoxia: Secondary | ICD-10-CM | POA: Diagnosis present

## 2018-12-25 DIAGNOSIS — S8012XD Contusion of left lower leg, subsequent encounter: Secondary | ICD-10-CM | POA: Diagnosis not present

## 2018-12-25 DIAGNOSIS — Z8679 Personal history of other diseases of the circulatory system: Secondary | ICD-10-CM

## 2018-12-25 DIAGNOSIS — J9601 Acute respiratory failure with hypoxia: Secondary | ICD-10-CM | POA: Diagnosis not present

## 2018-12-25 DIAGNOSIS — J9600 Acute respiratory failure, unspecified whether with hypoxia or hypercapnia: Secondary | ICD-10-CM

## 2018-12-25 DIAGNOSIS — J811 Chronic pulmonary edema: Secondary | ICD-10-CM

## 2018-12-25 DIAGNOSIS — I471 Supraventricular tachycardia: Secondary | ICD-10-CM | POA: Diagnosis present

## 2018-12-25 DIAGNOSIS — Z9981 Dependence on supplemental oxygen: Secondary | ICD-10-CM | POA: Diagnosis not present

## 2018-12-25 DIAGNOSIS — M19041 Primary osteoarthritis, right hand: Secondary | ICD-10-CM | POA: Diagnosis present

## 2018-12-25 DIAGNOSIS — N170 Acute kidney failure with tubular necrosis: Secondary | ICD-10-CM | POA: Diagnosis present

## 2018-12-25 DIAGNOSIS — M549 Dorsalgia, unspecified: Secondary | ICD-10-CM | POA: Diagnosis present

## 2018-12-25 DIAGNOSIS — I272 Pulmonary hypertension, unspecified: Secondary | ICD-10-CM | POA: Diagnosis present

## 2018-12-25 DIAGNOSIS — J449 Chronic obstructive pulmonary disease, unspecified: Secondary | ICD-10-CM | POA: Diagnosis present

## 2018-12-25 DIAGNOSIS — Z9109 Other allergy status, other than to drugs and biological substances: Secondary | ICD-10-CM

## 2018-12-25 DIAGNOSIS — W19XXXA Unspecified fall, initial encounter: Secondary | ICD-10-CM | POA: Diagnosis not present

## 2018-12-25 DIAGNOSIS — K219 Gastro-esophageal reflux disease without esophagitis: Secondary | ICD-10-CM | POA: Diagnosis present

## 2018-12-25 DIAGNOSIS — N179 Acute kidney failure, unspecified: Secondary | ICD-10-CM | POA: Diagnosis not present

## 2018-12-25 DIAGNOSIS — R570 Cardiogenic shock: Secondary | ICD-10-CM | POA: Diagnosis present

## 2018-12-25 DIAGNOSIS — Z7189 Other specified counseling: Secondary | ICD-10-CM | POA: Diagnosis not present

## 2018-12-25 DIAGNOSIS — N184 Chronic kidney disease, stage 4 (severe): Secondary | ICD-10-CM | POA: Diagnosis present

## 2018-12-25 DIAGNOSIS — Z7901 Long term (current) use of anticoagulants: Secondary | ICD-10-CM

## 2018-12-25 DIAGNOSIS — I5022 Chronic systolic (congestive) heart failure: Secondary | ICD-10-CM | POA: Diagnosis present

## 2018-12-25 DIAGNOSIS — S72009A Fracture of unspecified part of neck of unspecified femur, initial encounter for closed fracture: Secondary | ICD-10-CM | POA: Diagnosis present

## 2018-12-25 DIAGNOSIS — I5043 Acute on chronic combined systolic (congestive) and diastolic (congestive) heart failure: Secondary | ICD-10-CM | POA: Diagnosis present

## 2018-12-25 DIAGNOSIS — I130 Hypertensive heart and chronic kidney disease with heart failure and stage 1 through stage 4 chronic kidney disease, or unspecified chronic kidney disease: Secondary | ICD-10-CM | POA: Diagnosis present

## 2018-12-25 DIAGNOSIS — D62 Acute posthemorrhagic anemia: Secondary | ICD-10-CM | POA: Diagnosis present

## 2018-12-25 DIAGNOSIS — Y92009 Unspecified place in unspecified non-institutional (private) residence as the place of occurrence of the external cause: Secondary | ICD-10-CM

## 2018-12-25 DIAGNOSIS — Z881 Allergy status to other antibiotic agents status: Secondary | ICD-10-CM

## 2018-12-25 DIAGNOSIS — M25551 Pain in right hip: Secondary | ICD-10-CM | POA: Diagnosis present

## 2018-12-25 DIAGNOSIS — Z888 Allergy status to other drugs, medicaments and biological substances status: Secondary | ICD-10-CM

## 2018-12-25 LAB — COMPREHENSIVE METABOLIC PANEL
ALT: 14 U/L (ref 0–44)
AST: 24 U/L (ref 15–41)
Albumin: 3.9 g/dL (ref 3.5–5.0)
Alkaline Phosphatase: 107 U/L (ref 38–126)
Anion gap: 11 (ref 5–15)
BUN: 50 mg/dL — ABNORMAL HIGH (ref 8–23)
CO2: 31 mmol/L (ref 22–32)
Calcium: 9.4 mg/dL (ref 8.9–10.3)
Chloride: 96 mmol/L — ABNORMAL LOW (ref 98–111)
Creatinine, Ser: 1.65 mg/dL — ABNORMAL HIGH (ref 0.44–1.00)
GFR calc Af Amer: 33 mL/min — ABNORMAL LOW (ref >60)
GFR calc non Af Amer: 29 mL/min — ABNORMAL LOW (ref >60)
Glucose, Bld: 139 mg/dL — ABNORMAL HIGH (ref 70–99)
Potassium: 4.1 mmol/L (ref 3.5–5.1)
Sodium: 138 mmol/L (ref 135–145)
Total Bilirubin: 0.8 mg/dL (ref 0.3–1.2)
Total Protein: 6.8 g/dL (ref 6.5–8.1)

## 2018-12-25 LAB — CBC WITH DIFFERENTIAL/PLATELET
Abs Immature Granulocytes: 0.04 10*3/uL (ref 0.00–0.07)
Basophils Absolute: 0.1 10*3/uL (ref 0.0–0.1)
Basophils Relative: 1 %
Eosinophils Absolute: 0.2 10*3/uL (ref 0.0–0.5)
Eosinophils Relative: 3 %
HCT: 27.2 % — ABNORMAL LOW (ref 36.0–46.0)
Hemoglobin: 8 g/dL — ABNORMAL LOW (ref 12.0–15.0)
Immature Granulocytes: 1 %
Lymphocytes Relative: 13 %
Lymphs Abs: 0.8 10*3/uL (ref 0.7–4.0)
MCH: 24.3 pg — ABNORMAL LOW (ref 26.0–34.0)
MCHC: 29.4 g/dL — ABNORMAL LOW (ref 30.0–36.0)
MCV: 82.7 fL (ref 80.0–100.0)
Monocytes Absolute: 0.6 10*3/uL (ref 0.1–1.0)
Monocytes Relative: 9 %
Neutro Abs: 4.7 10*3/uL (ref 1.7–7.7)
Neutrophils Relative %: 73 %
Platelets: 182 10*3/uL (ref 150–400)
RBC: 3.29 MIL/uL — ABNORMAL LOW (ref 3.87–5.11)
RDW: 17 % — ABNORMAL HIGH (ref 11.5–15.5)
WBC: 6.3 10*3/uL (ref 4.0–10.5)
nRBC: 0 % (ref 0.0–0.2)

## 2018-12-25 LAB — PROTIME-INR
INR: 3.1 — ABNORMAL HIGH (ref 0.8–1.2)
Prothrombin Time: 31.1 seconds — ABNORMAL HIGH (ref 11.4–15.2)

## 2018-12-25 MED ORDER — VITAMIN K1 10 MG/ML IJ SOLN
1.0000 mg | Freq: Once | INTRAVENOUS | Status: AC
  Administered 2018-12-26: 1 mg via INTRAVENOUS
  Filled 2018-12-25: qty 0.1

## 2018-12-25 MED ORDER — ONDANSETRON HCL 4 MG/2ML IJ SOLN
4.0000 mg | Freq: Once | INTRAMUSCULAR | Status: AC
  Administered 2018-12-25: 4 mg via INTRAVENOUS
  Filled 2018-12-25: qty 2

## 2018-12-25 MED ORDER — MORPHINE SULFATE (PF) 2 MG/ML IV SOLN
2.0000 mg | Freq: Once | INTRAVENOUS | Status: AC
  Administered 2018-12-25: 2 mg via INTRAVENOUS
  Filled 2018-12-25: qty 1

## 2018-12-25 NOTE — ED Triage Notes (Signed)
Pt presents after fall at home. Pt states she slid out of the chair. Pt has shortening and external rotation of R Leg. Pt unable to bear weight on RLE. Pt wears 4L/02 chronically at home. Pt discharged from hospital x 2 days ago. Had elevated INR of 5.

## 2018-12-25 NOTE — H&P (Signed)
[place] [facility] [place] NAME: [name]    MR#:  [id number]  DATE OF BIRTH:  Sep 05, 1934  DATE OF ADMISSION:  12/25/2018  PRIMARY CARE PHYSICIAN: Rusty Aus, MD   REQUESTING/REFERRING PHYSICIAN: Jimmye Norman, MD  CHIEF COMPLAINT:   Chief Complaint  Patient presents with  . Fall  . Hip Pain    HISTORY OF PRESENT ILLNESS:  [name]  is a 84 y.o. female who presents with chief complaint as above.  Patient presents to the ED after mechanical fall and subsequent right hip pain.  Imaging here showed right hip fracture.  Orthopedic surgeon contacted by ED physician.  Hospitalist called for admission  PAST MEDICAL HISTORY:   Past Medical History:  Diagnosis Date  . Arthritis    "back, hands, knees" (04/10/2016)  . Chronic back pain   . 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 EF 25% by echo in 08/2015 b. RHC in 08/2015 showed normal filling pressures; c. 10/2018 Echo (in setting of atrial tachycardia): EF 25-30%, impaired relaxation. Glob HK. RVSP 7mmHg. Mod dil RA/LA. Mech MV w/ nl fxn (mean grad 55mmHg). Mod TR.   Marland Kitchen Chronic kidney disease   . Compression fracture    "several; all in my back" (04/10/2016)  . GERD (gastroesophageal reflux disease)   . Hypertension   . Lymphedema   . Pacemaker 05/2017  . PAF (paroxysmal atrial fibrillation) ([place])    a. On amio & Coumadin - CHA2DS2VASc = 4.  . S/P MVR (mitral valve replacement)    a. MVR 1994 b. redo MVR in 08/2014 - on Coumadin  . Shortness of breath dyspnea      PAST SURGICAL HISTORY:   Past Surgical History:  Procedure Laterality Date  . BREAST LUMPECTOMY Right 2005?   benign lump excision  . CARDIAC CATHETERIZATION    . [place], 2016   "MVR; MVR"  . CARDIOVERSION N/A 11/06/2018   Procedure: CARDIOVERSION;  Surgeon: Minna Merritts, MD;  Location: [facility] ORS;  Service: Cardiovascular;   Laterality: N/A;  . CATARACT EXTRACTION W/PHACO Right 02/20/2015   Procedure: CATARACT EXTRACTION PHACO AND INTRAOCULAR LENS PLACEMENT ([place]);  Surgeon: Estill Cotta, MD;  Location: [facility] OR[facility];  Service: Ophthalmology;  Laterality: Right;  US01:31.2 AP 25.3% CDE40.13 Fluid lot # [id number]  . DRESSING CHANGE UNDER ANESTHESIA Left 11/17/2018   Procedure: DRESSING CHANGE;  Surgeon: Robert Bellow, MD;  Location: [facility] OR[facility];  Service: General;  Laterality: Left;  no anesthesia  . ELECTROPHYSIOLOGIC STUDY N/A 10/09/2015   Procedure: CARDIOVERSION;  Surgeon: Wellington Hampshire, MD;  Location: [facility] OR[facility];  Service: Cardiovascular;  Laterality: N/A;  . INCISION AND DRAINAGE OF WOUND Left 11/13/2018   Procedure: LEFT LEG DRESSING CHANGE;  Surgeon: Robert Bellow, MD;  Location: [facility];  Service: General;  Laterality: Left;  . INCISION AND DRAINAGE OF WOUND Left 11/19/2018   Procedure: LEFT LEG DRESSING CHANGE AND GRAFT APPLICATION;  Surgeon: Robert Bellow, MD;  Location: [facility];  Service: General;  Laterality: Left;  . IR GENERIC HISTORICAL  04/01/2016   IR RADIOLOGIST EVAL & MGMT 04/01/2016 [facility]  . IR GENERIC HISTORICAL  04/15/2016   IR VERTEBROPLASTY CERV/THOR BX INC UNI/BIL INC/INJECT/IMAGING 04/15/2016 Luanne Bras, MD [facility]  . IR GENERIC HISTORICAL  04/15/2016   IR VERTEBROPLASTY EA ADDL (T&LS) BX INC UNI/BIL INC INJECT/IMAGING 04/15/2016 Luanne Bras, MD [facility]  . IR GENERIC  HISTORICAL  04/15/2016   IR VERTEBROPLASTY EA ADDL (T&LS) BX INC UNI/BIL INC INJECT/IMAGING 04/15/2016 Luanne Bras, MD [facility]  . IR GENERIC HISTORICAL  05/20/2016   IR RADIOLOGIST EVAL & MGMT 05/20/2016 [facility]  . IR GENERIC HISTORICAL  06/13/2016   IR VERTEBROPLASTY EA ADDL (T&LS) BX INC UNI/BIL INC INJECT/IMAGING 06/13/2016 Luanne Bras, MD [facility]  . IR GENERIC HISTORICAL  06/13/2016   IR SACROPLASTY BILATERAL 06/13/2016 Luanne Bras, MD  [facility]  . IR GENERIC HISTORICAL  06/13/2016   IR VERTEBROPLASTY LUMBAR BX INC UNI/BIL INC/INJECT/IMAGING 06/13/2016 Luanne Bras, MD [facility]  . IRRIGATION AND DEBRIDEMENT HEMATOMA Left 07/05/2015   Procedure: IRRIGATION AND DEBRIDEMENT HEMATOMA;  Surgeon: Robert Bellow, MD;  Location: [facility];  Service: General;  Laterality: Left;  . IRRIGATION AND DEBRIDEMENT HEMATOMA Left 11/11/2018   Procedure: IRRIGATION AND DEBRIDEMENT LEFT LEG HEMATOMA;  Surgeon: Robert Bellow, MD;  Location: [facility];  Service: General;  Laterality: Left;  . pyloric stenosis  07/1937  . US ECHOCARDIOGRAPHY       SOCIAL HISTORY:   Social History   Tobacco Use  . Smoking status: Former Smoker    Packs/day: 1.00    Years: 27.00    Pack years: 27.00    Types: Cigarettes    Quit date: 07/01/1980    Years since quitting: 38.5  . Smokeless tobacco: Never Used  . Tobacco comment: "quit smoking ~ 1980  Substance Use Topics  . Alcohol use: Not Currently    Alcohol/week: 0.0 standard drinks    Comment: 04/10/2016 "I'll have a drink on holidays/special occasions"     FAMILY HISTORY:   Family History  Problem Relation Age of Onset  . Stroke Mother   . Renal Disease Father      DRUG ALLERGIES:   Allergies  Allergen Reactions  . Ace Inhibitors     Cough  . Doxycycline Nausea Only  . Hydrocodone Itching and Nausea Only  . Mercury     Other reaction(s): Unknown  . Silver Dermatitis    Severe itching  . Cephalexin Rash  . Clindamycin/Lincomycin Rash  . Nickel Rash  . Penicillins Rash    Has patient had a PCN reaction causing immediate rash, facial/tongue/throat swelling, SOB or lightheadedness with hypotension: YES Has patient had a PCN reaction causing severe rash involving mucus membranes or skin necrosis: NO Has patient had a PCN reaction that required hospitalization NO Has patient had a PCN reaction occurring within the last 10 years: NO If all of the above answers are  "NO", then may proceed with Cephalosporin use.    MEDICATIONS AT HOME:   Prior to Admission medications   Medication Sig Start Date End Date Taking? Authorizing Provider  acetaminophen (TYLENOL) 650 MG CR tablet Take 1 tablet (650 mg total) by mouth every 6 (six) hours as needed for pain (mild and moderate pain). 11/30/18   Gladstone Lighter, MD  albuterol (PROVENTIL) (2.5 MG/3ML) 0.083% nebulizer solution Take 3 mLs (2.5 mg total) by nebulization every 6 (six) hours as needed for wheezing or shortness of breath. 11/30/18   Gladstone Lighter, MD  amiodarone (PACERONE) 200 MG tablet Take 1 tablet (200 mg total) by mouth daily. 09/24/18   Minna Merritts, MD  carvedilol (COREG) 3.125 MG tablet Take 1 tablet (3.125 mg total) by mouth 2 (two) times daily with a meal. 12/15/18   Gouru, Aruna, MD  Cholecalciferol (VITAMIN D) 2000 units tablet Take 2,000 Units by mouth daily.  [provider]  furosemide (LASIX) 40 MG tablet Take 1 tablet (40 mg total) by mouth 2 (two) times daily. 11/30/18   Gladstone Lighter, MD  hydrOXYzine (ATARAX/VISTARIL) 25 MG tablet Take 1 tablet (25 mg total) by mouth 3 (three) times daily as needed for itching or anxiety. 12/15/18   Gouru, Illene Silver, MD  ipratropium-albuterol (DUONEB) 0.5-2.5 (3) MG/3ML SOLN Take 3 mLs by nebulization every 6 (six) hours as needed for up to 90 doses. 12/15/18   Nicholes Mango, MD  losartan (COZAAR) 25 MG tablet Take 0.5 tablets (12.5 mg total) by mouth daily. 12/16/18   Gouru, Illene Silver, MD  Menthol (ICY HOT) 5 % PTCH Apply 1 patch topically daily as needed (pain).    [provider]  metolazone (ZAROXOLYN) 2.5 MG tablet Take 1 tablet (2.5 mg total) once daily on Monday Wednesday and Friday. 12/23/18   Theora Gianotti, NP  multivitamin-lutein [facility]) CAPS capsule Take 1 capsule by mouth daily. 12/16/18   Gouru, Illene Silver, MD  polyethylene glycol (MIRALAX / GLYCOLAX) 17 g packet Take 17 g by mouth daily. 11/30/18   Gladstone Lighter, MD  potassium chloride SA (K-DUR) 20 MEQ tablet Take 1 tablet (20 mEq total) by mouth daily. 11/30/18   Gladstone Lighter, MD  traMADol (ULTRAM) 50 MG tablet Take 1 tablet (50 mg total) by mouth every 6 (six) hours as needed for moderate pain. 12/15/18   Nicholes Mango, MD  vitamin C (ASCORBIC ACID) 500 MG tablet Take 500 mg by mouth daily.    [provider]  warfarin (COUMADIN) 6 MG tablet Take 1 tablet (6 mg total) by mouth daily. 12/23/18   Theora Gianotti, NP  zinc sulfate 220 (50 Zn) MG capsule Take 1 capsule (220 mg total) by mouth daily. 12/01/18   Gladstone Lighter, MD    REVIEW OF SYSTEMS:  Review of Systems  Constitutional: Negative for chills, fever, malaise/fatigue and weight loss.  HENT: Negative for ear pain, hearing loss and tinnitus.   Eyes: Negative for blurred vision, double vision, pain and redness.  Respiratory: Negative for cough, hemoptysis and shortness of breath.   Cardiovascular: Negative for chest pain, palpitations, orthopnea and leg swelling.  Gastrointestinal: Negative for abdominal pain, constipation, diarrhea, nausea and vomiting.  Genitourinary: Negative for dysuria, frequency and hematuria.  Musculoskeletal: Negative for back pain, joint pain (Right hip) and neck pain.  Skin:       No acne, rash, or lesions  Neurological: Negative for dizziness, tremors, focal weakness and weakness.  Endo/Heme/Allergies: Negative for polydipsia. Does not bruise/bleed easily.  Psychiatric/Behavioral: Negative for depression. The patient is not nervous/anxious and does not have insomnia.      VITAL SIGNS:   Vitals:   12/25/18 2212 12/25/18 2218 12/25/18 2219  BP:  106/63   Pulse:  (!) 59   Resp:  (!) 24   SpO2: 100% 99%   Weight:   67.1 kg  Height:   4\' 11"  (1.499 m)   Wt Readings from Last 3 Encounters:  12/25/18 67.1 kg  12/23/18 65.8 kg  12/22/18 65.8 kg    PHYSICAL EXAMINATION:  Physical Exam  Vitals reviewed. Constitutional: She  is oriented to person, place, and time. She appears well-developed and well-nourished. No distress.  HENT:  Head: Normocephalic and atraumatic.  Mouth/Throat: Oropharynx is clear and moist.  Eyes: Pupils are equal, round, and reactive to light. Conjunctivae and EOM are normal. No scleral icterus.  Neck: Normal range of motion. Neck supple. No JVD present. No thyromegaly  present.  Cardiovascular: Normal rate, regular rhythm and intact distal pulses. Exam reveals no gallop and no friction rub.  No murmur heard. Respiratory: Effort normal and breath sounds normal. No respiratory distress. She has no wheezes. She has no rales.  GI: Soft. Bowel sounds are normal. She exhibits no distension. There is no abdominal tenderness.  Musculoskeletal: Normal range of motion.        General: Tenderness (Right hip) present. No edema.     Comments: No arthritis, no gout  Lymphadenopathy:    She has no cervical adenopathy.  Neurological: She is alert and oriented to person, place, and time. No cranial nerve deficit.  No dysarthria, no aphasia  Skin: Skin is warm and dry. No rash noted. No erythema.  Psychiatric: She has a normal mood and affect. Her behavior is normal. Judgment and thought content normal.    LABORATORY PANEL:   CBC Recent Labs  Lab 12/25/18 2229  WBC 6.3  HGB 8.0*  HCT 27.2*  PLT 182   ------------------------------------------------------------------------------------------------------------------  Chemistries  Recent Labs  Lab 12/25/18 2229  NA 138  K 4.1  CL 96*  CO2 31  GLUCOSE 139*  BUN 50*  CREATININE 1.65*  CALCIUM 9.4  AST 24  ALT 14  ALKPHOS 107  BILITOT 0.8   ------------------------------------------------------------------------------------------------------------------  Cardiac Enzymes No results for input(s): TROPONINI in the last 168  hours. ------------------------------------------------------------------------------------------------------------------  RADIOLOGY:  Dg Tibia/fibula Right  Result Date: 12/25/2018 CLINICAL DATA:  84 year old female with fall and right lower extremity pain. EXAM: RIGHT FEMUR 2 VIEWS; RIGHT TIBIA AND FIBULA - 2 VIEW; DG HIP (WITH OR WITHOUT PELVIS) 2-3V RIGHT COMPARISON:  None. FINDINGS: There is a comminuted and displaced intertrochanteric fracture of the right femur. There is no dislocation. The bones are osteopenic. There is osteoarthritic changes of the right knee. Calcified fibroids noted over the pelvis. There is mild diffuse subcutaneous edema and soft tissue contusion over the anterior shin. Vascular calcifications noted. IMPRESSION: Comminuted and displaced intertrochanteric fracture of the right femur. Electronically Signed   By: Anner Crete M.D.   On: 12/25/2018 23:11   Dg Chest Port 1 View  Result Date: 12/25/2018 CLINICAL DATA:  Fall EXAM: PORTABLE CHEST 1 VIEW COMPARISON:  12/08/2018, 12/07/2018, 12/06/2018, 12/06/2018 FINDINGS: Left-sided multi lead pacing device as before. Post sternotomy changes with valve prosthesis. Cardiomegaly with small moderate bilateral pleural effusions and basilar airspace disease. Vascular congestion with mild diffuse interstitial opacity consistent with underlying interstitial edema. Wedge-shaped consolidation in the right perihilar region, stable since 12/06/2018. IMPRESSION: 1. Continued cardiomegaly with vascular congestion and interstitial pulmonary edema. 2. Grossly stable small moderate left greater than right pleural effusions with airspace consolidation at both bases. No change in right perihilar wedge-shaped area of consolidation. Electronically Signed   By: Donavan Foil M.D.   On: 12/25/2018 23:13   Dg Hip Unilat  With Pelvis 2-3 Views Right  Result Date: 12/25/2018 CLINICAL DATA:  84 year old female with fall and right lower extremity pain.  EXAM: RIGHT FEMUR 2 VIEWS; RIGHT TIBIA AND FIBULA - 2 VIEW; DG HIP (WITH OR WITHOUT PELVIS) 2-3V RIGHT COMPARISON:  None. FINDINGS: There is a comminuted and displaced intertrochanteric fracture of the right femur. There is no dislocation. The bones are osteopenic. There is osteoarthritic changes of the right knee. Calcified fibroids noted over the pelvis. There is mild diffuse subcutaneous edema and soft tissue contusion over the anterior shin. Vascular calcifications noted. IMPRESSION: Comminuted and displaced intertrochanteric fracture of the right femur. Electronically Signed  By: Anner Crete M.D.   On: 12/25/2018 23:11   Dg Femur Min 2 Views Right  Result Date: 12/25/2018 CLINICAL DATA:  84 year old female with fall and right lower extremity pain. EXAM: RIGHT FEMUR 2 VIEWS; RIGHT TIBIA AND FIBULA - 2 VIEW; DG HIP (WITH OR WITHOUT PELVIS) 2-3V RIGHT COMPARISON:  None. FINDINGS: There is a comminuted and displaced intertrochanteric fracture of the right femur. There is no dislocation. The bones are osteopenic. There is osteoarthritic changes of the right knee. Calcified fibroids noted over the pelvis. There is mild diffuse subcutaneous edema and soft tissue contusion over the anterior shin. Vascular calcifications noted. IMPRESSION: Comminuted and displaced intertrochanteric fracture of the right femur. Electronically Signed   By: Anner Crete M.D.   On: 12/25/2018 23:11    EKG:   Orders placed or performed during the hospital encounter of 12/25/18  . ED EKG  . ED EKG    IMPRESSION AND PLAN:  Principal Problem:   Closed right hip fracture [facility]) -orthopedic surgery consult, defer to their recommendations for treatment of this problem.  Vitamin K given for warfarin reversal. Active Problems:   Chronic combined systolic and diastolic CHF -continue home meds   PAF (paroxysmal atrial fibrillation) (HCC) -continue home medications except for anticoagulation   HLD (hyperlipidemia) -home  dose antilipid   Chronic kidney disease (CKD), stage IV (severe) (HCC) -at baseline, avoid nephrotoxins and monitor  Chart review performed and case discussed with ED provider. Labs, imaging and/or ECG reviewed by provider and discussed with patient/family. Management plans discussed with the patient and/or family.  COVID-19 status: Test pending  DVT PROPHYLAXIS: Systemic anticoagulation  GI PROPHYLAXIS:  None  ADMISSION STATUS: Inpatient     CODE STATUS: Full Code Status History    Date Active Date Inactive Code Status Order ID Comments User Context   12/04/2018 1937 12/15/2018 1643 Full Code [id number]  Mayer Camel, NP Inpatient   11/24/2018 1621 11/30/2018 2027 Full Code [id number]  Fritzi Mandes, MD Inpatient   11/24/2018 1557 11/24/2018 1620 DNR [id number]  Fritzi Mandes, MD Inpatient   11/09/2018 0333 11/24/2018 1557 Full Code [id number]  Mayer Camel, NP ED   11/04/2018 1650 11/08/2018 2006 Full Code [id number]  Saundra Shelling, MD Inpatient   04/10/2016 2034 04/19/2016 1757 Full Code [id number]  Vianne Bulls, MD Inpatient   09/29/2015 1053 10/10/2015 1406 Full Code [id number]  [name], MD Inpatient   04/28/2015 1626 04/30/2015 1422 Full Code [id number]  Demetrios Loll, MD Inpatient   Advance Care Planning Activity      TOTAL TIME TAKING CARE OF THIS PATIENT: 45 minutes.   This patient was evaluated in the context of the global COVID-19 pandemic, which necessitated consideration that the patient might be at risk for infection with the SARS-CoV-2 virus that causes COVID-19. Institutional protocols and algorithms that pertain to the evaluation of patients at risk for COVID-19 are in a state of rapid change based on information released by regulatory bodies including the CDC and federal and state organizations. These policies and algorithms were followed to the best of this provider's knowledge to date during the patient's care at this facility.  Ethlyn Daniels 12/25/2018, 11:52  PM  [facility]  Office  [phone]  CC: Primary care physician; Rusty Aus, MD  Note:  This document was prepared using Dragon voice recognition software and may include unintentional dictation errors.

## 2018-12-25 NOTE — Telephone Encounter (Signed)
Called patient's daughter back. States patient's complaint of breathing is somewhat worse than Wednesday. Patient says it is hard to catch her breath when at rest sitting up. She doesn't feel she can get up to walk with physical therapy because of the shortness of breath as well.  Reports the swelling around ankles and legs has improved a little.  Saw pul[facility] at [facility] yesterday who told her he heard fluid in her lungs. Urine output seems to be good and patient is urinating frequently.  Weight 6/24 at office visit 145 lb  6/25 at [facility]  149 lb  6/26 at home  148 lb.  Daughter verbalized understanding of recent lab results as well.  Patient is taking metolazone and furosemide as advised.  Routing to Ignacia Bayley, NP for advice.

## 2018-12-25 NOTE — Telephone Encounter (Signed)
Results called to pt. Pt verbalized understanding of results and plan of care. She will let us know if symptoms do not improve prior to then.

## 2018-12-25 NOTE — Telephone Encounter (Signed)
Pt c/o swelling: STAT is pt has developed SOB within 24 hours  1) How much weight have you gained and in what time span? 4-5 lb  2) If swelling, where is the swelling located? Legs, ankles, knees and stomach  3) Are you currently taking a fluid pill? yes  4) Are you currently SOB? Yes (chronic but  Little worse than usual)  5) Do you have a log of your daily weights (if so, list)?   6/24   145lb  6/25   149lb  6) Have you gained 3 pounds in a day or 5 pounds in a week? Yes, 5 lb in week  7) Have you traveled recently? No

## 2018-12-25 NOTE — Telephone Encounter (Signed)
Difficult situation.  With rising weight and worsening dyspnea, it appears that she may not be responding well to oral diuresis-metolazone and Lasix.  Low threshold for readmission.  She may need to present to the emergency room over the weekend if symptoms worsen prior to follow-up visit next week.

## 2018-12-25 NOTE — ED Provider Notes (Signed)
[facility] Emergency Department Provider Note       Time seen: ----------------------------------------- 10:39 PM on 12/25/2018 -----------------------------------------   I have reviewed the triage vital signs and the nursing notes.  HISTORY   Chief Complaint Fall and Hip Pain   HPI [name] is a 84 y.o. female with a history of chronic pain, CHF, chronic kidney disease, GERD, hypertension, lymphedema, atrial fibrillation on Coumadin who presents to the ED for a fall.  Patient reports she fell at home and she slid out of her chair.  She has shortening and rotation of her right leg.  She cannot bear weight on the right lower extremity.  Chronically she is on 4 L of oxygen.  Was discharged in the hospital 2 days ago.  INR was reportedly elevated at that time to 5  Past Medical History:  Diagnosis Date  . Arthritis    "back, hands, knees" (04/10/2016)  . Chronic back pain   . 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 EF 25% by echo in 08/2015 b. RHC in 08/2015 showed normal filling pressures; c. 10/2018 Echo (in setting of atrial tachycardia): EF 25-30%, impaired relaxation. Glob HK. RVSP 71mmHg. Mod dil RA/LA. Mech MV w/ nl fxn (mean grad 68mmHg). Mod TR.   Marland Kitchen Chronic kidney disease   . Compression fracture    "several; all in my back" (04/10/2016)  . GERD (gastroesophageal reflux disease)   . Hypertension   . Lymphedema   . Pacemaker 05/2017  . PAF (paroxysmal atrial fibrillation) ([place])    a. On amio & Coumadin - CHA2DS2VASc = 4.  . S/P MVR (mitral valve replacement)    a. MVR 1994 b. redo MVR in 08/2014 - on Coumadin  . Shortness of breath dyspnea     Patient Active Problem List   Diagnosis Date Noted  . Goals of care, counseling/discussion   . Palliative care by specialist   . Wound of left leg   . Persistent atrial fibrillation   . Atrial tachycardia ([place])   . Chronic kidney disease  (CKD), stage IV (severe) ([place])   . Chest pain of uncertain etiology   . Traumatic hematoma of left lower leg 11/09/2018  . Acute exacerbation of CHF (congestive heart failure) ([place]) 11/04/2018  . Pulmonary hypertension, unspecified ([place]) 11/04/2018  . S/P MVR (mitral valve replacement) 11/04/2018  . Encounter for therapeutic drug monitoring 09/09/2017  . Encounter for anticoagulation discussion and counseling 09/09/2017  . Paroxysmal atrial fibrillation ([place]) 02/25/2017  . Chronic venous insufficiency 10/27/2016  . Shortness of breath 08/14/2016  . Pressure injury of skin 04/12/2016  . Malnutrition of moderate degree 04/11/2016  . Vertebral compression fracture ([place]) 04/10/2016  . Intractable pain 04/10/2016  . Spinal compression fracture ([place]) 04/10/2016  . Acute on chronic HFrEF (heart failure with reduced ejection fraction) ([place]) 11/19/2015  . Lymphedema   . CKD (chronic kidney disease), stage III ([place])   . Bilateral lower extremity edema 08/30/2015  . Acute on chronic renal failure ([place]) 04/28/2015  . Hyponatremia 04/28/2015  . History of mitral valve replacement with mechanical valve 11/04/2014  . Long term current use of anticoagulant therapy 09/28/2014  . 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 Generalized OA 11/28/2013  . HLD (hyperlipidemia) 11/28/2013    Past Surgical History:  Procedure Laterality Date  . BREAST LUMPECTOMY Right 2005?   benign lump excision  . CARDIAC CATHETERIZATION    . [place],  2016   "MVR; MVR"  . CARDIOVERSION N/A 11/06/2018   Procedure: CARDIOVERSION;  Surgeon: Minna Merritts, MD;  Location: [facility] ORS;  Service: Cardiovascular;  Laterality: N/A;  . CATARACT EXTRACTION W/PHACO Right 02/20/2015   Procedure: CATARACT EXTRACTION PHACO AND INTRAOCULAR LENS PLACEMENT ([place]);  Surgeon: Estill Cotta, MD;  Location: [facility] ORS;  Service: Ophthalmology;  Laterality: Right;  US01:31.2 AP 25.3% CDE40.13 Fluid lot #  [id number]  . DRESSING CHANGE UNDER ANESTHESIA Left 11/17/2018   Procedure: DRESSING CHANGE;  Surgeon: Robert Bellow, MD;  Location: [facility] ORS;  Service: General;  Laterality: Left;  no anesthesia  . ELECTROPHYSIOLOGIC STUDY N/A 10/09/2015   Procedure: CARDIOVERSION;  Surgeon: Wellington Hampshire, MD;  Location: [facility] ORS;  Service: Cardiovascular;  Laterality: N/A;  . INCISION AND DRAINAGE OF WOUND Left 11/13/2018   Procedure: LEFT LEG DRESSING CHANGE;  Surgeon: Robert Bellow, MD;  Location: [facility] ORS;  Service: General;  Laterality: Left;  . INCISION AND DRAINAGE OF WOUND Left 11/19/2018   Procedure: LEFT LEG DRESSING CHANGE AND GRAFT APPLICATION;  Surgeon: Robert Bellow, MD;  Location: [facility] ORS;  Service: General;  Laterality: Left;  . IR GENERIC HISTORICAL  04/01/2016   IR RADIOLOGIST EVAL & MGMT 04/01/2016 [facility]  . IR GENERIC HISTORICAL  04/15/2016   IR VERTEBROPLASTY CERV/THOR BX INC UNI/BIL INC/INJECT/IMAGING 04/15/2016 Luanne Bras, MD [facility]  . IR GENERIC HISTORICAL  04/15/2016   IR VERTEBROPLASTY EA ADDL (T&LS) BX INC UNI/BIL INC INJECT/IMAGING 04/15/2016 Luanne Bras, MD [facility]  . IR GENERIC HISTORICAL  04/15/2016   IR VERTEBROPLASTY EA ADDL (T&LS) BX INC UNI/BIL INC INJECT/IMAGING 04/15/2016 Luanne Bras, MD [facility]  . IR GENERIC HISTORICAL  05/20/2016   IR RADIOLOGIST EVAL & MGMT 05/20/2016 [facility]  . IR GENERIC HISTORICAL  06/13/2016   IR VERTEBROPLASTY EA ADDL (T&LS) BX INC UNI/BIL INC INJECT/IMAGING 06/13/2016 Luanne Bras, MD [facility]  . IR GENERIC HISTORICAL  06/13/2016   IR SACROPLASTY BILATERAL 06/13/2016 Luanne Bras, MD [facility]  . IR GENERIC HISTORICAL  06/13/2016   IR VERTEBROPLASTY LUMBAR BX INC UNI/BIL INC/INJECT/IMAGING 06/13/2016 Luanne Bras, MD [facility]  . IRRIGATION AND DEBRIDEMENT HEMATOMA Left 07/05/2015   Procedure: IRRIGATION AND DEBRIDEMENT HEMATOMA;  Surgeon: Robert Bellow,  MD;  Location: [facility] ORS;  Service: General;  Laterality: Left;  . IRRIGATION AND DEBRIDEMENT HEMATOMA Left 11/11/2018   Procedure: IRRIGATION AND DEBRIDEMENT LEFT LEG HEMATOMA;  Surgeon: Robert Bellow, MD;  Location: [facility] ORS;  Service: General;  Laterality: Left;  . pyloric stenosis  07/1937  . US ECHOCARDIOGRAPHY      Allergies Ace inhibitors, Doxycycline, Hydrocodone, Mercury, Silver, Cephalexin, Clindamycin/lincomycin, Nickel, and Penicillins  Social History Social History   Tobacco Use  . Smoking status: Former Smoker    Packs/day: 1.00    Years: 27.00    Pack years: 27.00    Types: Cigarettes    Quit date: 07/01/1980    Years since quitting: 38.5  . Smokeless tobacco: Never Used  . Tobacco comment: "quit smoking ~ 1980  Substance Use Topics  . Alcohol use: Not Currently    Alcohol/week: 0.0 standard drinks    Comment: 04/10/2016 "I'll have a drink on holidays/special occasions"  . Drug use: No   Review of Systems Constitutional: Negative for fever. Cardiovascular: Negative for chest pain. Respiratory: Positive for chronic shortness of breath Gastrointestinal: Negative for abdominal pain, vomiting and diarrhea. Musculoskeletal: Positive for right leg pain Skin: Positive for right leg bruising Neurological:  Positive for weakness  All systems negative/normal/unremarkable except as stated in the HPI  ____________________________________________   PHYSICAL EXAM:  VITAL SIGNS: ED Triage Vitals  Enc Vitals Group     BP 12/25/18 2218 106/63     Pulse Rate 12/25/18 2218 (!) 59     Resp 12/25/18 2218 (!) 24     Temp --      Temp src --      SpO2 12/25/18 2212 100 %     Weight 12/25/18 2219 148 lb (67.1 kg)     Height 12/25/18 2219 4\' 11"  (1.499 m)     Head Circumference --      Peak Flow --      Pain Score 12/25/18 2219 10     Pain Loc --      Pain Edu? --      Excl. in [place]? --    Constitutional: Alert and oriented.  Chronically ill-appearing, no obvious  distress Eyes: Conjunctivae are normal. Normal extraocular movements. Cardiovascular: Normal rate, regular rhythm. No murmurs, rubs, or gallops. Respiratory: Normal respiratory effort without tachypnea nor retractions. Breath sounds are clear and equal bilaterally. No wheezes/rales/rhonchi. Gastrointestinal: Soft and nontender. Normal bowel sounds Musculoskeletal: Right lower extremity is externally rotated and significantly shortened compared to the left.  There is extensive bruising distal to the right knee with extensive swelling as well. Neurologic:  Normal speech and language. No gross focal neurologic deficits are appreciated.  Skin: Right lower extremity ecchymosis and edema Psychiatric: Mood and affect are normal. Speech and behavior are normal.  ____________________________________________  ED COURSE:  As part of my medical decision making, I reviewed the following data within the [place] History obtained from family if available, nursing notes, old chart and ekg, as well as notes from prior ED visits. Patient presented for a fall, we will assess with labs and imaging as indicated at this time.   Procedures  [name] was evaluated in Emergency Department on 12/25/2018 for the symptoms described in the history of present illness. She was evaluated in the context of the global COVID-19 pandemic, which necessitated consideration that the patient might be at risk for infection with the SARS-CoV-2 virus that causes COVID-19. Institutional protocols and algorithms that pertain to the evaluation of patients at risk for COVID-19 are in a state of rapid change based on information released by regulatory bodies including the CDC and federal and state organizations. These policies and algorithms were followed during the patient's care in the ED.  ____________________________________________   LABS (pertinent positives/negatives)  Labs Reviewed  CBC WITH  DIFFERENTIAL/PLATELET - Abnormal; Notable for the following components:      Result Value   RBC 3.29 (*)    Hemoglobin 8.0 (*)    HCT 27.2 (*)    MCH 24.3 (*)    MCHC 29.4 (*)    RDW 17.0 (*)    All other components within normal limits  COMPREHENSIVE METABOLIC PANEL - Abnormal; Notable for the following components:   Chloride 96 (*)    Glucose, Bld 139 (*)    BUN 50 (*)    Creatinine, Ser 1.65 (*)    GFR calc non Af Amer 29 (*)    GFR calc Af Amer 33 (*)    All other components within normal limits  PROTIME-INR - Abnormal; Notable for the following components:   Prothrombin Time 31.1 (*)    INR 3.1 (*)    All other components within normal limits  URINALYSIS, COMPLETE (UACMP) WITH MICROSCOPIC  CBG MONITORING, ED    RADIOLOGY Images were viewed by me  Right femur, right tib-fib Comminuted right intertrochanteric hip fracture noted, right femur and right tib-fib reveal arthritis but no acute fracture ____________________________________________   DIFFERENTIAL DIAGNOSIS   Fracture, dislocation, contusion, coagulopathy, hyponatremia, dehydration, occult infection  FINAL ASSESSMENT AND PLAN  Fall, right hip fracture   Plan: The patient had presented for mechanical fall. Patient's labs reveal chronic anemia and chronic kidney disease. Patient's imaging revealed a right hip fracture.  I will discuss with orthopedics and the hospitalist for admission.  She was given morphine for pain.   Laurence Aly, MD    Note: This note was generated in part or whole with voice recognition software. Voice recognition is usually quite accurate but there are transcription errors that can and very often do occur. I apologize for any typographical errors that were not detected and corrected.     Earleen Newport, MD 12/25/18 2312

## 2018-12-26 ENCOUNTER — Other Ambulatory Visit: Payer: Self-pay

## 2018-12-26 DIAGNOSIS — S8012XD Contusion of left lower leg, subsequent encounter: Secondary | ICD-10-CM

## 2018-12-26 DIAGNOSIS — S72001A Fracture of unspecified part of neck of right femur, initial encounter for closed fracture: Secondary | ICD-10-CM

## 2018-12-26 DIAGNOSIS — I5043 Acute on chronic combined systolic (congestive) and diastolic (congestive) heart failure: Secondary | ICD-10-CM

## 2018-12-26 DIAGNOSIS — I480 Paroxysmal atrial fibrillation: Secondary | ICD-10-CM

## 2018-12-26 LAB — BASIC METABOLIC PANEL
Anion gap: 9 (ref 5–15)
BUN: 53 mg/dL — ABNORMAL HIGH (ref 8–23)
CO2: 32 mmol/L (ref 22–32)
Calcium: 9 mg/dL (ref 8.9–10.3)
Chloride: 98 mmol/L (ref 98–111)
Creatinine, Ser: 1.67 mg/dL — ABNORMAL HIGH (ref 0.44–1.00)
GFR calc Af Amer: 33 mL/min — ABNORMAL LOW (ref >60)
GFR calc non Af Amer: 28 mL/min — ABNORMAL LOW (ref >60)
Glucose, Bld: 111 mg/dL — ABNORMAL HIGH (ref 70–99)
Potassium: 3.8 mmol/L (ref 3.5–5.1)
Sodium: 139 mmol/L (ref 135–145)

## 2018-12-26 LAB — PROTIME-INR
INR: 2.8 — ABNORMAL HIGH (ref 0.8–1.2)
INR: 1.9 — ABNORMAL HIGH (ref 0.8–1.2)
Prothrombin Time: 28.8 seconds — ABNORMAL HIGH (ref 11.4–15.2)
Prothrombin Time: 21.1 seconds — ABNORMAL HIGH (ref 11.4–15.2)

## 2018-12-26 LAB — HEMOGLOBIN AND HEMATOCRIT, BLOOD
HCT: 25 % — ABNORMAL LOW (ref 36.0–46.0)
Hemoglobin: 7.8 g/dL — ABNORMAL LOW (ref 12.0–15.0)

## 2018-12-26 LAB — CBC
HCT: 20 % — ABNORMAL LOW (ref 36.0–46.0)
Hemoglobin: 5.9 g/dL — ABNORMAL LOW (ref 12.0–15.0)
MCH: 24.5 pg — ABNORMAL LOW (ref 26.0–34.0)
MCHC: 29.5 g/dL — ABNORMAL LOW (ref 30.0–36.0)
MCV: 83 fL (ref 80.0–100.0)
Platelets: 175 10*3/uL (ref 150–400)
RBC: 2.41 MIL/uL — ABNORMAL LOW (ref 3.87–5.11)
RDW: 16.9 % — ABNORMAL HIGH (ref 11.5–15.5)
WBC: 8.4 10*3/uL (ref 4.0–10.5)
nRBC: 0 % (ref 0.0–0.2)

## 2018-12-26 LAB — PREPARE RBC (CROSSMATCH)

## 2018-12-26 LAB — MRSA PCR SCREENING: MRSA by PCR: NEGATIVE

## 2018-12-26 LAB — SARS CORONAVIRUS 2 BY RT PCR (HOSPITAL ORDER, PERFORMED IN ~~LOC~~ HOSPITAL LAB): SARS Coronavirus 2: NEGATIVE

## 2018-12-26 MED ORDER — ONDANSETRON HCL 4 MG PO TABS: 4.0000 mg | ORAL_TABLET | Freq: Four times a day (QID) | ORAL | Status: DC | PRN

## 2018-12-26 MED ORDER — ACETAMINOPHEN 650 MG RE SUPP
650.0000 mg | Freq: Four times a day (QID) | RECTAL | Status: DC | PRN
  Filled 2018-12-26 (×2): qty 1

## 2018-12-26 MED ORDER — TRAMADOL HCL 50 MG PO TABS
50.0000 mg | ORAL_TABLET | Freq: Four times a day (QID) | ORAL | Status: DC | PRN
  Administered 2018-12-26 (×3): 50 mg via ORAL
  Filled 2018-12-26 (×4): qty 1

## 2018-12-26 MED ORDER — SODIUM CHLORIDE 0.9 % IV SOLN
INTRAVENOUS | Status: AC
  Administered 2018-12-26: 02:00:00 via INTRAVENOUS

## 2018-12-26 MED ORDER — ACETAMINOPHEN 325 MG PO TABS
650.0000 mg | ORAL_TABLET | Freq: Four times a day (QID) | ORAL | Status: DC | PRN
  Administered 2018-12-26 – 2019-01-05 (×8): 650 mg via ORAL
  Filled 2018-12-26 (×8): qty 2

## 2018-12-26 MED ORDER — SODIUM CHLORIDE 0.9% IV SOLUTION: Freq: Once | INTRAVENOUS | Status: DC

## 2018-12-26 MED ORDER — VITAMIN K1 10 MG/ML IJ SOLN
1.0000 mg | Freq: Once | INTRAVENOUS | Status: AC
  Administered 2018-12-26: 1 mg via INTRAVENOUS
  Filled 2018-12-26: qty 0.1

## 2018-12-26 MED ORDER — ONDANSETRON HCL 4 MG/2ML IJ SOLN: 4.0000 mg | Freq: Four times a day (QID) | INTRAMUSCULAR | Status: DC | PRN

## 2018-12-26 NOTE — Progress Notes (Signed)
Pt only able to tolerate 5 min of Metaneb

## 2018-12-26 NOTE — Progress Notes (Addendum)
Pt has not voided since she come from ED. Pt reported that she last voided on 01/24/2019 at 7 pm. Bladder scan done. 285 ml's obtained. Dr. Marcille Blanco made aware. No orders received at this time.

## 2018-12-26 NOTE — ED Notes (Signed)
Transport pending the floor acquiring a low bed. Still not ready.

## 2018-12-26 NOTE — Progress Notes (Signed)
[place] [facility] [place] NAME: [name]    MR#:  [id number]  DATE OF BIRTH:  [birth year]  SUBJECTIVE:  CHIEF COMPLAINT:   Chief Complaint  Patient presents with   Fall   Hip Pain   No new complaint this morning.  Patient denies having any pain at this time.  No bleeding noted clinically.  Has some discomfort around her right hip at the site of fracture with some swelling.  No fevers.  REVIEW OF SYSTEMS:  Review of Systems  Constitutional: Negative for chills and fever.  HENT: Negative for hearing loss and tinnitus.   Eyes: Negative for blurred vision and double vision.  Respiratory: Negative for cough and shortness of breath.   Cardiovascular: Negative for chest pain and palpitations.  Gastrointestinal: Negative for abdominal pain, diarrhea, heartburn, nausea and vomiting.  Genitourinary: Negative for dysuria and urgency.  Musculoskeletal: Negative for myalgias.       Right hip discomfort secondary to hip fracture following fall prior to admission.  Skin: Negative for itching and rash.  Neurological: Negative for dizziness and headaches.  Psychiatric/Behavioral: Negative for depression and hallucinations.    DRUG ALLERGIES:   Allergies  Allergen Reactions   Ace Inhibitors     Cough   Doxycycline Nausea Only   Hydrocodone Itching and Nausea Only   Mercury     Other reaction(s): Unknown   Silver Dermatitis    Severe itching   Cephalexin Rash   Clindamycin/Lincomycin Rash   Nickel Rash   Penicillins Rash    Has patient had a PCN reaction causing immediate rash, facial/tongue/throat swelling, SOB or lightheadedness with hypotension: YES Has patient had a PCN reaction causing severe rash involving mucus membranes or skin necrosis: NO Has patient had a PCN reaction that required hospitalization NO Has patient had a PCN reaction occurring within the last 10 years: NO If all of the above answers are "NO", then may  proceed with Cephalosporin use.   VITALS:  Blood pressure (!) 99/48, pulse 62, temperature 98.4 F (36.9 C), temperature source Oral, resp. rate 18, height 4\' 11"  (1.499 m), weight 67.1 kg, SpO2 99 %. PHYSICAL EXAMINATION:   Physical Exam  Constitutional: She is oriented to person, place, and time and well-developed, well-nourished, and in no distress.  HENT:  Head: Normocephalic and atraumatic.  Right Ear: External ear normal.  Eyes: Pupils are equal, round, and reactive to light. Conjunctivae are normal. Right eye exhibits no discharge.  Neck: Normal range of motion. Neck supple. No thyromegaly present.  Cardiovascular: Normal rate.  Irregularly irregular  Pulmonary/Chest: Effort normal and breath sounds normal. No respiratory distress.  Abdominal: Soft. Bowel sounds are normal. She exhibits no distension.  Musculoskeletal:        General: No edema.     Comments: Slight discomfort around right hip fracture site  Neurological: She is alert and oriented to person, place, and time.  Skin: Skin is warm and dry. She is not diaphoretic. No erythema.  Psychiatric: Affect and judgment normal.   LABORATORY PANEL:  Female CBC Recent Labs  Lab 12/26/18 0719  WBC 8.4  HGB 5.9*  HCT 20.0*  PLT 175   ------------------------------------------------------------------------------------------------------------------ Chemistries  Recent Labs  Lab 12/25/18 2229 12/26/18 0719  NA 138 139  K 4.1 3.8  CL 96* 98  CO2 31 32  GLUCOSE 139* 111*  BUN 50* 53*  CREATININE 1.65* 1.67*  CALCIUM 9.4 9.0  AST 24  --   ALT  14  --   ALKPHOS 107  --   BILITOT 0.8  --    RADIOLOGY:  Dg Tibia/fibula Right  Result Date: 12/25/2018 CLINICAL DATA:  84 year old female with fall and right lower extremity pain. EXAM: RIGHT FEMUR 2 VIEWS; RIGHT TIBIA AND FIBULA - 2 VIEW; DG HIP (WITH OR WITHOUT PELVIS) 2-3V RIGHT COMPARISON:  None. FINDINGS: There is a comminuted and displaced intertrochanteric fracture  of the right femur. There is no dislocation. The bones are osteopenic. There is osteoarthritic changes of the right knee. Calcified fibroids noted over the pelvis. There is mild diffuse subcutaneous edema and soft tissue contusion over the anterior shin. Vascular calcifications noted. IMPRESSION: Comminuted and displaced intertrochanteric fracture of the right femur. Electronically Signed   By: Anner Crete M.D.   On: 12/25/2018 23:11   Dg Chest Port 1 View  Result Date: 12/25/2018 CLINICAL DATA:  Fall EXAM: PORTABLE CHEST 1 VIEW COMPARISON:  12/08/2018, 12/07/2018, 12/06/2018, 12/06/2018 FINDINGS: Left-sided multi lead pacing device as before. Post sternotomy changes with valve prosthesis. Cardiomegaly with small moderate bilateral pleural effusions and basilar airspace disease. Vascular congestion with mild diffuse interstitial opacity consistent with underlying interstitial edema. Wedge-shaped consolidation in the right perihilar region, stable since 12/06/2018. IMPRESSION: 1. Continued cardiomegaly with vascular congestion and interstitial pulmonary edema. 2. Grossly stable small moderate left greater than right pleural effusions with airspace consolidation at both bases. No change in right perihilar wedge-shaped area of consolidation. Electronically Signed   By: Donavan Foil M.D.   On: 12/25/2018 23:13   Dg Hip Unilat  With Pelvis 2-3 Views Right  Result Date: 12/25/2018 CLINICAL DATA:  84 year old female with fall and right lower extremity pain. EXAM: RIGHT FEMUR 2 VIEWS; RIGHT TIBIA AND FIBULA - 2 VIEW; DG HIP (WITH OR WITHOUT PELVIS) 2-3V RIGHT COMPARISON:  None. FINDINGS: There is a comminuted and displaced intertrochanteric fracture of the right femur. There is no dislocation. The bones are osteopenic. There is osteoarthritic changes of the right knee. Calcified fibroids noted over the pelvis. There is mild diffuse subcutaneous edema and soft tissue contusion over the anterior shin. Vascular  calcifications noted. IMPRESSION: Comminuted and displaced intertrochanteric fracture of the right femur. Electronically Signed   By: Anner Crete M.D.   On: 12/25/2018 23:11   Dg Femur Min 2 Views Right  Result Date: 12/25/2018 CLINICAL DATA:  84 year old female with fall and right lower extremity pain. EXAM: RIGHT FEMUR 2 VIEWS; RIGHT TIBIA AND FIBULA - 2 VIEW; DG HIP (WITH OR WITHOUT PELVIS) 2-3V RIGHT COMPARISON:  None. FINDINGS: There is a comminuted and displaced intertrochanteric fracture of the right femur. There is no dislocation. The bones are osteopenic. There is osteoarthritic changes of the right knee. Calcified fibroids noted over the pelvis. There is mild diffuse subcutaneous edema and soft tissue contusion over the anterior shin. Vascular calcifications noted. IMPRESSION: Comminuted and displaced intertrochanteric fracture of the right femur. Electronically Signed   By: Anner Crete M.D.   On: 12/25/2018 23:11   ASSESSMENT AND PLAN:     1. Closed right hip fracture secondary to mechanical fall  Patient appears to have some swelling around her right hip. Orthopedic physician already consulted.  Patient getting vitamin K due to elevated INR. Patient with extensive medical history including chronic systolic and diastolic CHF, paroxysmal atrial fibrillation and mechanical mitral valve, chronic hypoxic respiratory failure on 3 L of home oxygen therapy and COPD. I have requested for cardiologist and pulmonary input as well today.  2.  Chronic combined systolic and diastolic CHF Stable at this time. Monitor clinically.  Follow-up on cardiologist input.  3.  Paroxysmal atrial fibrillation and history of mechanical mitral valve; AV paced Rate controlled. Anticoagulation on hold due to right hip fracture and anemia requiring packed red blood cell transfusions.  Patient given vitamin K already Given history of mechanical mitral valve, follow-up on cardiologist input regarding when  to safely initiate anticoagulation  4.  Chronic hypoxic respiratory failure Stable.  Patient on 3 L of home oxygen therapy.  5.  Acute blood loss anemia Noted drop in hemoglobin to 5.9.  Suspect underlying hematoma around the right hip joint fracture site. Hip fracture being managed by surgeon. 2 units of packed red blood cell ordered.  6.  Chronic kidney disease stage IV Stable.  Monitor closely.  DVT prophylaxis; Coumadin placed on hold due to acute anemia.  Unable to do SCDs due to lower extremity wounds.   All the records are reviewed and case discussed with Care Management/Social Worker. Management plans discussed with the patient, family and they are in agreement. I called patient's daughter Ms. [name] and left a voice message.  Also called patient's son Mr. Brunella Wileman [phone]) and updated him on treatment plans as outlined above.  All questions were answered and he is in agreement with the plan of care.  CODE STATUS: Full Code  TOTAL TIME TAKING CARE OF THIS PATIENT: 39 minutes.   More than 50% of the time was spent in counseling/coordination of care: YES  POSSIBLE D/C IN 3 DAYS, DEPENDING ON CLINICAL CONDITION.   Tivon Lemoine M.D on 12/26/2018 at 11:26 AM  Between 7am to 6pm - Pager - [phone]  After 6pm go to [email or web address] - Proofreader  [facility]  Office  [phone]  CC: Primary care physician; Rusty Aus, MD  Note: This dictation was prepared with Dragon dictation along with smaller phrase technology. Any transcriptional errors that result from this process are unintentional.

## 2018-12-26 NOTE — Consult Note (Signed)
Date of Consultation:  12/26/2018  Requesting Physician:  Otila Back, MD  Reason for Consultation:  Left lower extremity wound  History of Present Illness: [name] is a 84 y.o. female known to Dr. Bary Castilla, s/p I&D of left lower extremity wound following fall with resultant hematoma.  She had A-cell placement with Dr. Marla Roe and has been following with both outpatient for wound care.  She has been healing very well with 60-70% epithelialization per Dr. Dwyane Luo note on 6/23.  Unfortunately, the patient suffered a fall at home yesterday and was admitted overnight with a right hip/femur fracture.  She's currently pending orthopedic surgery consult for further management of her fracture.  However, given the complexity of her wound, surgery was consulted for further evaluation and wound care.  Patient's daughter was at bedside with the patient.  Reports that the wound is healing well and has not had any issues or worsening since last seen by Dr. Bary Castilla.  Past Medical History: Past Medical History:  Diagnosis Date  . Arthritis    "back, hands, knees" (04/10/2016)  . Chronic back pain   . 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 EF 25% by echo in 08/2015 b. RHC in 08/2015 showed normal filling pressures; c. 10/2018 Echo (in setting of atrial tachycardia): EF 25-30%, impaired relaxation. Glob HK. RVSP 76mmHg. Mod dil RA/LA. Mech MV w/ nl fxn (mean grad 68mmHg). Mod TR.   Marland Kitchen Chronic kidney disease   . Compression fracture    "several; all in my back" (04/10/2016)  . GERD (gastroesophageal reflux disease)   . Hypertension   . Lymphedema   . Pacemaker 05/2017  . PAF (paroxysmal atrial fibrillation) ([place])    a. On amio & Coumadin - CHA2DS2VASc = 4.  . S/P MVR (mitral valve replacement)    a. MVR 1994 b. redo MVR in 08/2014 - on Coumadin  . Shortness of breath dyspnea      Past Surgical History: Past Surgical History:  Procedure  Laterality Date  . BREAST LUMPECTOMY Right 2005?   benign lump excision  . CARDIAC CATHETERIZATION    . [place], 2016   "MVR; MVR"  . CARDIOVERSION N/A 11/06/2018   Procedure: CARDIOVERSION;  Surgeon: Minna Merritts, MD;  Location: [facility];  Service: Cardiovascular;  Laterality: N/A;  . CATARACT EXTRACTION W/PHACO Right 02/20/2015   Procedure: CATARACT EXTRACTION PHACO AND INTRAOCULAR LENS PLACEMENT ([place]);  Surgeon: Estill Cotta, MD;  Location: [facility];  Service: Ophthalmology;  Laterality: Right;  US01:31.2 AP 25.3% CDE40.13 Fluid lot # [id number]  . DRESSING CHANGE UNDER ANESTHESIA Left 11/17/2018   Procedure: DRESSING CHANGE;  Surgeon: Robert Bellow, MD;  Location: [facility];  Service: General;  Laterality: Left;  no anesthesia  . ELECTROPHYSIOLOGIC STUDY N/A 10/09/2015   Procedure: CARDIOVERSION;  Surgeon: Wellington Hampshire, MD;  Location: [facility];  Service: Cardiovascular;  Laterality: N/A;  . INCISION AND DRAINAGE OF WOUND Left 11/13/2018   Procedure: LEFT LEG DRESSING CHANGE;  Surgeon: Robert Bellow, MD;  Location: [facility];  Service: General;  Laterality: Left;  . INCISION AND DRAINAGE OF WOUND Left 11/19/2018   Procedure: LEFT LEG DRESSING CHANGE AND GRAFT APPLICATION;  Surgeon: Robert Bellow, MD;  Location: [facility];  Service: General;  Laterality: Left;  . IR GENERIC HISTORICAL  04/01/2016   IR RADIOLOGIST EVAL & MGMT 04/01/2016 [facility]  . IR GENERIC HISTORICAL  04/15/2016   IR VERTEBROPLASTY CERV/THOR  BX INC UNI/BIL INC/INJECT/IMAGING 04/15/2016 Luanne Bras, MD [facility]  . IR GENERIC HISTORICAL  04/15/2016   IR VERTEBROPLASTY EA ADDL (T&LS) BX INC UNI/BIL INC INJECT/IMAGING 04/15/2016 Luanne Bras, MD [facility]  . IR GENERIC HISTORICAL  04/15/2016   IR VERTEBROPLASTY EA ADDL (T&LS) BX INC UNI/BIL INC INJECT/IMAGING 04/15/2016 Luanne Bras, MD [facility]  . IR GENERIC HISTORICAL  05/20/2016   IR  RADIOLOGIST EVAL & MGMT 05/20/2016 [facility]  . IR GENERIC HISTORICAL  06/13/2016   IR VERTEBROPLASTY EA ADDL (T&LS) BX INC UNI/BIL INC INJECT/IMAGING 06/13/2016 Luanne Bras, MD [facility]  . IR GENERIC HISTORICAL  06/13/2016   IR SACROPLASTY BILATERAL 06/13/2016 Luanne Bras, MD [facility]  . IR GENERIC HISTORICAL  06/13/2016   IR VERTEBROPLASTY LUMBAR BX INC UNI/BIL INC/INJECT/IMAGING 06/13/2016 Luanne Bras, MD [facility]  . IRRIGATION AND DEBRIDEMENT HEMATOMA Left 07/05/2015   Procedure: IRRIGATION AND DEBRIDEMENT HEMATOMA;  Surgeon: Robert Bellow, MD;  Location: [facility];  Service: General;  Laterality: Left;  . IRRIGATION AND DEBRIDEMENT HEMATOMA Left 11/11/2018   Procedure: IRRIGATION AND DEBRIDEMENT LEFT LEG HEMATOMA;  Surgeon: Robert Bellow, MD;  Location: [facility];  Service: General;  Laterality: Left;  . pyloric stenosis  07/1937  . US ECHOCARDIOGRAPHY      Home Medications: Prior to Admission medications   Medication Sig Start Date End Date Taking? Authorizing Provider  acetaminophen (TYLENOL) 650 MG CR tablet Take 1 tablet (650 mg total) by mouth every 6 (six) hours as needed for pain (mild and moderate pain). 11/30/18   Gladstone Lighter, MD  albuterol (PROVENTIL) (2.5 MG/3ML) 0.083% nebulizer solution Take 3 mLs (2.5 mg total) by nebulization every 6 (six) hours as needed for wheezing or shortness of breath. 11/30/18   Gladstone Lighter, MD  amiodarone (PACERONE) 200 MG tablet Take 1 tablet (200 mg total) by mouth daily. 09/24/18   Minna Merritts, MD  carvedilol (COREG) 3.125 MG tablet Take 1 tablet (3.125 mg total) by mouth 2 (two) times daily with a meal. 12/15/18   Gouru, Aruna, MD  Cholecalciferol (VITAMIN D) 2000 units tablet Take 2,000 Units by mouth daily.    [provider]  furosemide (LASIX) 40 MG tablet Take 1 tablet (40 mg total) by mouth 2 (two) times daily. 11/30/18   Gladstone Lighter, MD  hydrOXYzine (ATARAX/VISTARIL) 25  MG tablet Take 1 tablet (25 mg total) by mouth 3 (three) times daily as needed for itching or anxiety. 12/15/18   Gouru, Illene Silver, MD  ipratropium-albuterol (DUONEB) 0.5-2.5 (3) MG/3ML SOLN Take 3 mLs by nebulization every 6 (six) hours as needed for up to 90 doses. 12/15/18   Nicholes Mango, MD  losartan (COZAAR) 25 MG tablet Take 0.5 tablets (12.5 mg total) by mouth daily. 12/16/18   Gouru, Illene Silver, MD  Menthol (ICY HOT) 5 % PTCH Apply 1 patch topically daily as needed (pain).    [provider]  metolazone (ZAROXOLYN) 2.5 MG tablet Take 1 tablet (2.5 mg total) once daily on Monday Wednesday and Friday. 12/23/18   Theora Gianotti, NP  multivitamin-lutein [facility]) CAPS capsule Take 1 capsule by mouth daily. 12/16/18   Gouru, Illene Silver, MD  polyethylene glycol (MIRALAX / GLYCOLAX) 17 g packet Take 17 g by mouth daily. 11/30/18   Gladstone Lighter, MD  potassium chloride SA (K-DUR) 20 MEQ tablet Take 1 tablet (20 mEq total) by mouth daily. 11/30/18   Gladstone Lighter, MD  traMADol (ULTRAM) 50 MG tablet Take 1 tablet (50 mg total) by  mouth every 6 (six) hours as needed for moderate pain. 12/15/18   Nicholes Mango, MD  vitamin C (ASCORBIC ACID) 500 MG tablet Take 500 mg by mouth daily.    [provider]  warfarin (COUMADIN) 6 MG tablet Take 1 tablet (6 mg total) by mouth daily. 12/23/18   Theora Gianotti, NP  zinc sulfate 220 (50 Zn) MG capsule Take 1 capsule (220 mg total) by mouth daily. 12/01/18   Gladstone Lighter, MD    Allergies: Allergies  Allergen Reactions  . Ace Inhibitors     Cough  . Doxycycline Nausea Only  . Hydrocodone Itching and Nausea Only  . Mercury     Other reaction(s): Unknown  . Silver Dermatitis    Severe itching  . Cephalexin Rash  . Clindamycin/Lincomycin Rash  . Nickel Rash  . Penicillins Rash    Has patient had a PCN reaction causing immediate rash, facial/tongue/throat swelling, SOB or lightheadedness with hypotension: YES Has patient  had a PCN reaction causing severe rash involving mucus membranes or skin necrosis: NO Has patient had a PCN reaction that required hospitalization NO Has patient had a PCN reaction occurring within the last 10 years: NO If all of the above answers are "NO", then may proceed with Cephalosporin use.    Social History:  reports that she quit smoking about 38 years ago. Her smoking use included cigarettes. She has a 27.00 pack-year smoking history. She has never used smokeless tobacco. She reports previous alcohol use. She reports that she does not use drugs.   Family History: Family History  Problem Relation Age of Onset  . Stroke Mother   . Renal Disease Father     Review of Systems: Review of Systems  Constitutional: Negative for chills and fever.  Respiratory: Negative for shortness of breath.   Cardiovascular: Negative for chest pain.  Gastrointestinal: Negative for abdominal pain, nausea and vomiting.  Genitourinary: Negative for dysuria.  Musculoskeletal: Positive for joint pain (right hip).  Skin:       Left lower extremity wound, healing well.  Neurological: Negative for dizziness.  Psychiatric/Behavioral: Negative for depression.    Physical Exam BP 103/64 (BP Location: Left Arm)   Pulse 65   Temp 98 F (36.7 C) (Oral)   Resp 16   Ht 4\' 11"  (1.499 m)   Wt 67.1 kg   SpO2 99%   BMI 29.89 kg/m  CONSTITUTIONAL: No acute distress HEENT:  Normocephalic, atraumatic, extraocular motion intact. NECK: Trachea is midline, and there is no jugular venous distension. RESPIRATORY:  Lungs are clear, and breath sounds are equal bilaterally. Normal respiratory effort without pathologic use of accessory muscles. CARDIOVASCULAR: Heart is regular without murmurs, gallops, or rubs. GI: The abdomen is soft, non-distended, non-tender.  MUSCULOSKELETAL:  Left lower extremity wound with dressing in place.  Mepilex layer not removed as is part of the therapeutic dressing.  However, no  notable cellulitis, induration, or ecchymosis of the skin surrounding the wound.  No significant pain with dressing change.  Dressing changed consisting of KY lubricant with dry gauze dressing, Kerlix, and ACE wrap.   NEUROLOGIC:  Motor and sensation is grossly normal.  Cranial nerves are grossly intact. PSYCH:  Alert and oriented to person, place and time. Affect is normal.  Laboratory Analysis: Results for orders placed or performed during the hospital encounter of 12/25/18 (from the past 24 hour(s))  CBC with Differential     Status: Abnormal   Collection Time: 12/25/18 10:29 PM  Result Value  Ref Range   WBC 6.3 4.0 - 10.5 K/uL   RBC 3.29 (L) 3.87 - 5.11 MIL/uL   Hemoglobin 8.0 (L) 12.0 - 15.0 g/dL   HCT 27.2 (L) 36.0 - 46.0 %   MCV 82.7 80.0 - 100.0 fL   MCH 24.3 (L) 26.0 - 34.0 pg   MCHC 29.4 (L) 30.0 - 36.0 g/dL   RDW 17.0 (H) 11.5 - 15.5 %   Platelets 182 150 - 400 K/uL   nRBC 0.0 0.0 - 0.2 %   Neutrophils Relative % 73 %   Neutro Abs 4.7 1.7 - 7.7 K/uL   Lymphocytes Relative 13 %   Lymphs Abs 0.8 0.7 - 4.0 K/uL   Monocytes Relative 9 %   Monocytes Absolute 0.6 0.1 - 1.0 K/uL   Eosinophils Relative 3 %   Eosinophils Absolute 0.2 0.0 - 0.5 K/uL   Basophils Relative 1 %   Basophils Absolute 0.1 0.0 - 0.1 K/uL   Immature Granulocytes 1 %   Abs Immature Granulocytes 0.04 0.00 - 0.07 K/uL  Comprehensive metabolic panel     Status: Abnormal   Collection Time: 12/25/18 10:29 PM  Result Value Ref Range   Sodium 138 135 - 145 mmol/L   Potassium 4.1 3.5 - 5.1 mmol/L   Chloride 96 (L) 98 - 111 mmol/L   CO2 31 22 - 32 mmol/L   Glucose, Bld 139 (H) 70 - 99 mg/dL   BUN 50 (H) 8 - 23 mg/dL   Creatinine, Ser 1.65 (H) 0.44 - 1.00 mg/dL   Calcium 9.4 8.9 - 10.3 mg/dL   Total Protein 6.8 6.5 - 8.1 g/dL   Albumin 3.9 3.5 - 5.0 g/dL   AST 24 15 - 41 U/L   ALT 14 0 - 44 U/L   Alkaline Phosphatase 107 38 - 126 U/L   Total Bilirubin 0.8 0.3 - 1.2 mg/dL   GFR calc non Af Amer 29 (L) >60  mL/min   GFR calc Af Amer 33 (L) >60 mL/min   Anion gap 11 5 - 15  Protime-INR     Status: Abnormal   Collection Time: 12/25/18 10:30 PM  Result Value Ref Range   Prothrombin Time 31.1 (H) 11.4 - 15.2 seconds   INR 3.1 (H) 0.8 - 1.2  SARS Coronavirus 2 (CEPHEID- Performed in [place] hospital lab), Hosp Order     Status: None   Collection Time: 12/26/18 12:33 AM   Specimen: Nasopharyngeal Swab  Result Value Ref Range   SARS Coronavirus 2 NEGATIVE NEGATIVE  MRSA PCR Screening     Status: None   Collection Time: 12/26/18  1:54 AM   Specimen: Nasal Mucosa; Nasopharyngeal  Result Value Ref Range   MRSA by PCR NEGATIVE NEGATIVE  Protime-INR     Status: Abnormal   Collection Time: 12/26/18  7:19 AM  Result Value Ref Range   Prothrombin Time 28.8 (H) 11.4 - 15.2 seconds   INR 2.8 (H) 0.8 - 1.2  Basic metabolic panel     Status: Abnormal   Collection Time: 12/26/18  7:19 AM  Result Value Ref Range   Sodium 139 135 - 145 mmol/L   Potassium 3.8 3.5 - 5.1 mmol/L   Chloride 98 98 - 111 mmol/L   CO2 32 22 - 32 mmol/L   Glucose, Bld 111 (H) 70 - 99 mg/dL   BUN 53 (H) 8 - 23 mg/dL   Creatinine, Ser 1.67 (H) 0.44 - 1.00 mg/dL   Calcium 9.0 8.9 - 10.3  mg/dL   GFR calc non Af Amer 28 (L) >60 mL/min   GFR calc Af Amer 33 (L) >60 mL/min   Anion gap 9 5 - 15  CBC     Status: Abnormal   Collection Time: 12/26/18  7:19 AM  Result Value Ref Range   WBC 8.4 4.0 - 10.5 K/uL   RBC 2.41 (L) 3.87 - 5.11 MIL/uL   Hemoglobin 5.9 (L) 12.0 - 15.0 g/dL   HCT 20.0 (L) 36.0 - 46.0 %   MCV 83.0 80.0 - 100.0 fL   MCH 24.5 (L) 26.0 - 34.0 pg   MCHC 29.5 (L) 30.0 - 36.0 g/dL   RDW 16.9 (H) 11.5 - 15.5 %   Platelets 175 150 - 400 K/uL   nRBC 0.0 0.0 - 0.2 %  Type and screen [facility]     Status: None (Preliminary result)   Collection Time: 12/26/18 11:04 AM  Result Value Ref Range   ABO/RH(D) A POS    Antibody Screen NEG    Sample Expiration 12/29/2018,2359    Unit Number  [id number]    Blood Component Type RED CELLS,LR    Unit division 00    Status of Unit ISSUED    Transfusion Status OK TO TRANSFUSE    Crossmatch Result Compatible    Unit Number [id number]    Blood Component Type RED CELLS,LR    Unit division 00    Status of Unit ISSUED    Transfusion Status OK TO TRANSFUSE    Crossmatch Result      Compatible Performed at [facility], [address] [place], [place] [address]   Prepare RBC     Status: None   Collection Time: 12/26/18 11:04 AM  Result Value Ref Range   Order Confirmation      ORDER PROCESSED BY BLOOD BANK Performed at [facility], [address] [place], [place] [address]   Protime-INR     Status: Abnormal   Collection Time: 12/26/18  3:34 PM  Result Value Ref Range   Prothrombin Time 21.1 (H) 11.4 - 15.2 seconds   INR 1.9 (H) 0.8 - 1.2    Imaging: Dg Tibia/fibula Right  Result Date: 12/25/2018 CLINICAL DATA:  84 year old female with fall and right lower extremity pain. EXAM: RIGHT FEMUR 2 VIEWS; RIGHT TIBIA AND FIBULA - 2 VIEW; DG HIP (WITH OR WITHOUT PELVIS) 2-3V RIGHT COMPARISON:  None. FINDINGS: There is a comminuted and displaced intertrochanteric fracture of the right femur. There is no dislocation. The bones are osteopenic. There is osteoarthritic changes of the right knee. Calcified fibroids noted over the pelvis. There is mild diffuse subcutaneous edema and soft tissue contusion over the anterior shin. Vascular calcifications noted. IMPRESSION: Comminuted and displaced intertrochanteric fracture of the right femur. Electronically Signed   By: Anner Crete M.D.   On: 12/25/2018 23:11   Dg Chest Port 1 View  Result Date: 12/25/2018 CLINICAL DATA:  Fall EXAM: PORTABLE CHEST 1 VIEW COMPARISON:  12/08/2018, 12/07/2018, 12/06/2018, 12/06/2018 FINDINGS: Left-sided multi lead pacing device as before. Post sternotomy changes with valve prosthesis. Cardiomegaly with small moderate bilateral pleural  effusions and basilar airspace disease. Vascular congestion with mild diffuse interstitial opacity consistent with underlying interstitial edema. Wedge-shaped consolidation in the right perihilar region, stable since 12/06/2018. IMPRESSION: 1. Continued cardiomegaly with vascular congestion and interstitial pulmonary edema. 2. Grossly stable small moderate left greater than right pleural effusions with airspace consolidation at both bases. No change in right perihilar wedge-shaped  area of consolidation. Electronically Signed   By: Donavan Foil M.D.   On: 12/25/2018 23:13   Dg Hip Unilat  With Pelvis 2-3 Views Right  Result Date: 12/25/2018 CLINICAL DATA:  84 year old female with fall and right lower extremity pain. EXAM: RIGHT FEMUR 2 VIEWS; RIGHT TIBIA AND FIBULA - 2 VIEW; DG HIP (WITH OR WITHOUT PELVIS) 2-3V RIGHT COMPARISON:  None. FINDINGS: There is a comminuted and displaced intertrochanteric fracture of the right femur. There is no dislocation. The bones are osteopenic. There is osteoarthritic changes of the right knee. Calcified fibroids noted over the pelvis. There is mild diffuse subcutaneous edema and soft tissue contusion over the anterior shin. Vascular calcifications noted. IMPRESSION: Comminuted and displaced intertrochanteric fracture of the right femur. Electronically Signed   By: Anner Crete M.D.   On: 12/25/2018 23:11   Dg Femur Min 2 Views Right  Result Date: 12/25/2018 CLINICAL DATA:  84 year old female with fall and right lower extremity pain. EXAM: RIGHT FEMUR 2 VIEWS; RIGHT TIBIA AND FIBULA - 2 VIEW; DG HIP (WITH OR WITHOUT PELVIS) 2-3V RIGHT COMPARISON:  None. FINDINGS: There is a comminuted and displaced intertrochanteric fracture of the right femur. There is no dislocation. The bones are osteopenic. There is osteoarthritic changes of the right knee. Calcified fibroids noted over the pelvis. There is mild diffuse subcutaneous edema and soft tissue contusion over the anterior  shin. Vascular calcifications noted. IMPRESSION: Comminuted and displaced intertrochanteric fracture of the right femur. Electronically Signed   By: Anner Crete M.D.   On: 12/25/2018 23:11    Assessment and Plan: This is a 84 y.o. female with left lower extremity wound  The patient's daughter assisted with the dressing change.  No acute issues at this point and her wound appears to be healing very well without any worsening or complications.  Will continue daily dressing changes.  Dr. Bary Castilla informed of the patient's admission and will see her on Monday 6/29.    Face-to-face time spent with the patient and care providers was 40 minutes, with more than 50% of the time spent counseling, educating, and coordinating care of the patient.     Melvyn Neth, MD [facility] Pg:  [phone]

## 2018-12-26 NOTE — ED Notes (Signed)
ED TO INPATIENT HANDOFF REPORT  ED Nurse Name and Phone #: Antonieta Pert, RN  S Name/Age/Gender [name] 84 y.o. female Room/Bed: ED18A/ED18A  Code Status   Code Status: Prior  Home/SNF/Other Home Patient oriented to: self, place and situation Is this baseline? Yes   Triage Complete: Triage complete  Chief Complaint Fall  Triage Note Pt presents after fall at home. Pt states she slid out of the chair. Pt has shortening and external rotation of R Leg. Pt unable to bear weight on RLE. Pt wears 4L/02 chronically at home. Pt discharged from hospital x 2 days ago. Had elevated INR of 5.   Allergies Allergies  Allergen Reactions  . Ace Inhibitors     Cough  . Doxycycline Nausea Only  . Hydrocodone Itching and Nausea Only  . Mercury     Other reaction(s): Unknown  . Silver Dermatitis    Severe itching  . Cephalexin Rash  . Clindamycin/Lincomycin Rash  . Nickel Rash  . Penicillins Rash    Has patient had a PCN reaction causing immediate rash, facial/tongue/throat swelling, SOB or lightheadedness with hypotension: YES Has patient had a PCN reaction causing severe rash involving mucus membranes or skin necrosis: NO Has patient had a PCN reaction that required hospitalization NO Has patient had a PCN reaction occurring within the last 10 years: NO If all of the above answers are "NO", then may proceed with Cephalosporin use.    Level of Care/Admitting Diagnosis ED Disposition    ED Disposition Condition [facility] Area: [place] [[id number]]  Level of Care: Med-Surg [16]  Covid Evaluation: Screening Protocol (No Symptoms)  Diagnosis: Hip fracture [facility]) [[id number]]  Admitting Physician: Lance Coon [[id number]]  Attending Physician: Lance Coon [phone]  Estimated length of stay: past midnight tomorrow  Certification:: I certify this patient will need inpatient services for at least 2 midnights  PT Class (Do Not Modify):  Inpatient [101]  PT Acc Code (Do Not Modify): Private [1]       B Medical/Surgery History Past Medical History:  Diagnosis Date  . Arthritis    "back, hands, knees" (04/10/2016)  . Chronic back pain   . 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 EF 25% by echo in 08/2015 b. RHC in 08/2015 showed normal filling pressures; c. 10/2018 Echo (in setting of atrial tachycardia): EF 25-30%, impaired relaxation. Glob HK. RVSP 34mmHg. Mod dil RA/LA. Mech MV w/ nl fxn (mean grad 25mmHg). Mod TR.   Marland Kitchen Chronic kidney disease   . Compression fracture    "several; all in my back" (04/10/2016)  . GERD (gastroesophageal reflux disease)   . Hypertension   . Lymphedema   . Pacemaker 05/2017  . PAF (paroxysmal atrial fibrillation) ([place])    a. On amio & Coumadin - CHA2DS2VASc = 4.  . S/P MVR (mitral valve replacement)    a. MVR 1994 b. redo MVR in 08/2014 - on Coumadin  . Shortness of breath dyspnea    Past Surgical History:  Procedure Laterality Date  . BREAST LUMPECTOMY Right 2005?   benign lump excision  . CARDIAC CATHETERIZATION    . [place], 2016   "MVR; MVR"  . CARDIOVERSION N/A 11/06/2018   Procedure: CARDIOVERSION;  Surgeon: Minna Merritts, MD;  Location: [facility] ORS;  Service: Cardiovascular;  Laterality: N/A;  . CATARACT EXTRACTION W/PHACO Right 02/20/2015   Procedure: CATARACT EXTRACTION PHACO AND INTRAOCULAR LENS  PLACEMENT (IOC);  Surgeon: Estill Cotta, MD;  Location: [facility] ORS;  Service: Ophthalmology;  Laterality: Right;  US01:31.2 AP 25.3% CDE40.13 Fluid lot # [id number]  . DRESSING CHANGE UNDER ANESTHESIA Left 11/17/2018   Procedure: DRESSING CHANGE;  Surgeon: Robert Bellow, MD;  Location: [facility] ORS;  Service: General;  Laterality: Left;  no anesthesia  . ELECTROPHYSIOLOGIC STUDY N/A 10/09/2015   Procedure: CARDIOVERSION;  Surgeon: Wellington Hampshire, MD;  Location: [facility] ORS;  Service: Cardiovascular;   Laterality: N/A;  . INCISION AND DRAINAGE OF WOUND Left 11/13/2018   Procedure: LEFT LEG DRESSING CHANGE;  Surgeon: Robert Bellow, MD;  Location: [facility] ORS;  Service: General;  Laterality: Left;  . INCISION AND DRAINAGE OF WOUND Left 11/19/2018   Procedure: LEFT LEG DRESSING CHANGE AND GRAFT APPLICATION;  Surgeon: Robert Bellow, MD;  Location: [facility] ORS;  Service: General;  Laterality: Left;  . IR GENERIC HISTORICAL  04/01/2016   IR RADIOLOGIST EVAL & MGMT 04/01/2016 [facility]  . IR GENERIC HISTORICAL  04/15/2016   IR VERTEBROPLASTY CERV/THOR BX INC UNI/BIL INC/INJECT/IMAGING 04/15/2016 Luanne Bras, MD [facility]  . IR GENERIC HISTORICAL  04/15/2016   IR VERTEBROPLASTY EA ADDL (T&LS) BX INC UNI/BIL INC INJECT/IMAGING 04/15/2016 Luanne Bras, MD [facility]  . IR GENERIC HISTORICAL  04/15/2016   IR VERTEBROPLASTY EA ADDL (T&LS) BX INC UNI/BIL INC INJECT/IMAGING 04/15/2016 Luanne Bras, MD [facility]  . IR GENERIC HISTORICAL  05/20/2016   IR RADIOLOGIST EVAL & MGMT 05/20/2016 [facility]  . IR GENERIC HISTORICAL  06/13/2016   IR VERTEBROPLASTY EA ADDL (T&LS) BX INC UNI/BIL INC INJECT/IMAGING 06/13/2016 Luanne Bras, MD [facility]  . IR GENERIC HISTORICAL  06/13/2016   IR SACROPLASTY BILATERAL 06/13/2016 Luanne Bras, MD [facility]  . IR GENERIC HISTORICAL  06/13/2016   IR VERTEBROPLASTY LUMBAR BX INC UNI/BIL INC/INJECT/IMAGING 06/13/2016 Luanne Bras, MD [facility]  . IRRIGATION AND DEBRIDEMENT HEMATOMA Left 07/05/2015   Procedure: IRRIGATION AND DEBRIDEMENT HEMATOMA;  Surgeon: Robert Bellow, MD;  Location: [facility] ORS;  Service: General;  Laterality: Left;  . IRRIGATION AND DEBRIDEMENT HEMATOMA Left 11/11/2018   Procedure: IRRIGATION AND DEBRIDEMENT LEFT LEG HEMATOMA;  Surgeon: Robert Bellow, MD;  Location: [facility] ORS;  Service: General;  Laterality: Left;  . pyloric stenosis  07/1937  . US ECHOCARDIOGRAPHY       A IV  Location/Drains/Wounds Patient Lines/Drains/Airways Status   Active Line/Drains/Airways    Name:   Placement date:   Placement time:   Site:   Days:   Pressure Injury 12/07/18 Left Unstageable - Full thickness tissue loss in which the base of the ulcer is covered by slough (yellow, tan, gray, green or brown) and/or eschar (tan, brown or black) in the wound bed. unstageable pressure injuries to left out   12/07/18    -     19   Wound / Incision (Open or Dehisced) 12/07/18 Other (Comment) Ankle Left;Posterior Unstageable pressure ulcer.  Black escar Left posterior ankle   12/07/18    1500    Ankle   19   Wound / Incision (Open or Dehisced) 12/04/18 Other (Comment) Leg Left;Posterior;Lower Prior Venous stasis, with numerous debridements.   12/04/18    1500    Leg   22          Intake/Output Last 24 hours No intake or output data in the 24 hours ending 12/26/18 0000  Labs/Imaging Results for orders placed or performed during the hospital encounter of 12/25/18 (from the past  48 hour(s))  CBC with Differential     Status: Abnormal   Collection Time: 12/25/18 10:29 PM  Result Value Ref Range   WBC 6.3 4.0 - 10.5 K/uL   RBC 3.29 (L) 3.87 - 5.11 MIL/uL   Hemoglobin 8.0 (L) 12.0 - 15.0 g/dL   HCT 27.2 (L) 36.0 - 46.0 %   MCV 82.7 80.0 - 100.0 fL   MCH 24.3 (L) 26.0 - 34.0 pg   MCHC 29.4 (L) 30.0 - 36.0 g/dL   RDW 17.0 (H) 11.5 - 15.5 %   Platelets 182 150 - 400 K/uL   nRBC 0.0 0.0 - 0.2 %   Neutrophils Relative % 73 %   Neutro Abs 4.7 1.7 - 7.7 K/uL   Lymphocytes Relative 13 %   Lymphs Abs 0.8 0.7 - 4.0 K/uL   Monocytes Relative 9 %   Monocytes Absolute 0.6 0.1 - 1.0 K/uL   Eosinophils Relative 3 %   Eosinophils Absolute 0.2 0.0 - 0.5 K/uL   Basophils Relative 1 %   Basophils Absolute 0.1 0.0 - 0.1 K/uL   Immature Granulocytes 1 %   Abs Immature Granulocytes 0.04 0.00 - 0.07 K/uL    Comment: Performed at [facility], [address] [place], [place] [address]   Comprehensive metabolic panel     Status: Abnormal   Collection Time: 12/25/18 10:29 PM  Result Value Ref Range   Sodium 138 135 - 145 mmol/L   Potassium 4.1 3.5 - 5.1 mmol/L   Chloride 96 (L) 98 - 111 mmol/L   CO2 31 22 - 32 mmol/L   Glucose, Bld 139 (H) 70 - 99 mg/dL   BUN 50 (H) 8 - 23 mg/dL   Creatinine, Ser 1.65 (H) 0.44 - 1.00 mg/dL   Calcium 9.4 8.9 - 10.3 mg/dL   Total Protein 6.8 6.5 - 8.1 g/dL   Albumin 3.9 3.5 - 5.0 g/dL   AST 24 15 - 41 U/L   ALT 14 0 - 44 U/L   Alkaline Phosphatase 107 38 - 126 U/L   Total Bilirubin 0.8 0.3 - 1.2 mg/dL   GFR calc non Af Amer 29 (L) >60 mL/min   GFR calc Af Amer 33 (L) >60 mL/min   Anion gap 11 5 - 15    Comment: Performed at [facility], [address] [place], [place] [address]  Protime-INR     Status: Abnormal   Collection Time: 12/25/18 10:30 PM  Result Value Ref Range   Prothrombin Time 31.1 (H) 11.4 - 15.2 seconds   INR 3.1 (H) 0.8 - 1.2    Comment: (NOTE) INR goal varies based on device and disease states. Performed at [facility], [place], [place] [address]    Dg Tibia/fibula Right  Result Date: 12/25/2018 CLINICAL DATA:  84 year old female with fall and right lower extremity pain. EXAM: RIGHT FEMUR 2 VIEWS; RIGHT TIBIA AND FIBULA - 2 VIEW; DG HIP (WITH OR WITHOUT PELVIS) 2-3V RIGHT COMPARISON:  None. FINDINGS: There is a comminuted and displaced intertrochanteric fracture of the right femur. There is no dislocation. The bones are osteopenic. There is osteoarthritic changes of the right knee. Calcified fibroids noted over the pelvis. There is mild diffuse subcutaneous edema and soft tissue contusion over the anterior shin. Vascular calcifications noted. IMPRESSION: Comminuted and displaced intertrochanteric fracture of the right femur. Electronically Signed   By: Anner Crete M.D.   On: 12/25/2018 23:11   Dg Chest [facility] [address]  Result Date: 12/25/2018 CLINICAL DATA:  Fall EXAM:  PORTABLE CHEST 1 VIEW COMPARISON:  12/08/2018, 12/07/2018, 12/06/2018, 12/06/2018 FINDINGS: Left-sided multi lead pacing device as before. Post sternotomy changes with valve prosthesis. Cardiomegaly with small moderate bilateral pleural effusions and basilar airspace disease. Vascular congestion with mild diffuse interstitial opacity consistent with underlying interstitial edema. Wedge-shaped consolidation in the right perihilar region, stable since 12/06/2018. IMPRESSION: 1. Continued cardiomegaly with vascular congestion and interstitial pulmonary edema. 2. Grossly stable small moderate left greater than right pleural effusions with airspace consolidation at both bases. No change in right perihilar wedge-shaped area of consolidation. Electronically Signed   By: Donavan Foil M.D.   On: 12/25/2018 23:13   Dg Hip Unilat  With Pelvis 2-3 Views Right  Result Date: 12/25/2018 CLINICAL DATA:  84 year old female with fall and right lower extremity pain. EXAM: RIGHT FEMUR 2 VIEWS; RIGHT TIBIA AND FIBULA - 2 VIEW; DG HIP (WITH OR WITHOUT PELVIS) 2-3V RIGHT COMPARISON:  None. FINDINGS: There is a comminuted and displaced intertrochanteric fracture of the right femur. There is no dislocation. The bones are osteopenic. There is osteoarthritic changes of the right knee. Calcified fibroids noted over the pelvis. There is mild diffuse subcutaneous edema and soft tissue contusion over the anterior shin. Vascular calcifications noted. IMPRESSION: Comminuted and displaced intertrochanteric fracture of the right femur. Electronically Signed   By: Anner Crete M.D.   On: 12/25/2018 23:11   Dg Femur Min 2 Views Right  Result Date: 12/25/2018 CLINICAL DATA:  84 year old female with fall and right lower extremity pain. EXAM: RIGHT FEMUR 2 VIEWS; RIGHT TIBIA AND FIBULA - 2 VIEW; DG HIP (WITH OR WITHOUT PELVIS) 2-3V RIGHT COMPARISON:  None. FINDINGS: There is a comminuted and displaced intertrochanteric fracture of the right  femur. There is no dislocation. The bones are osteopenic. There is osteoarthritic changes of the right knee. Calcified fibroids noted over the pelvis. There is mild diffuse subcutaneous edema and soft tissue contusion over the anterior shin. Vascular calcifications noted. IMPRESSION: Comminuted and displaced intertrochanteric fracture of the right femur. Electronically Signed   By: Anner Crete M.D.   On: 12/25/2018 23:11    Pending Labs Unresulted Labs (From admission, onward)    Start     Ordered   12/26/18 0500  Protime-INR  Tomorrow morning,   STAT     12/25/18 2317   12/25/18 2313  SARS Coronavirus 2 (CEPHEID- Performed in [place] hospital lab), Hosp Order  (Symptomatic Patients Labs with Precautions )  Once,   STAT    Question:  Patient immune status  Answer:  Normal   12/25/18 2312   12/25/18 2215  Urinalysis, Complete w Microscopic  (ALOC)  ONCE - STAT,   STAT     12/25/18 2214   Signed and Held  Basic metabolic panel  Tomorrow morning,   R     Signed and Held   Signed and Held  CBC  Tomorrow morning,   R     Signed and Held          Vitals/Pain Today's Vitals   12/25/18 2300 12/25/18 2315 12/25/18 2330 12/25/18 2345  BP:  107/60 (!) 96/55 (!) 92/53  Pulse: 60 (!) 59 62 60  Resp: (!) 27 (!) 23 (!) 25 (!) 24  SpO2: 99% 97% (!) 81% 95%  Weight:      Height:      PainSc:        Isolation Precautions No active isolations  Medications Medications  phytonadione (  VITAMIN K) 1 mg in dextrose 5 % 50 mL IVPB (has no administration in time range)  morphine 2 MG/ML injection 2 mg (2 mg Intravenous Given 12/25/18 2313)  ondansetron (ZOFRAN) injection 4 mg (4 mg Intravenous Given 12/25/18 2313)    Mobility walks with device Moderate fall risk   Focused Assessments Peripheral vascular pedal pulses very week to palpation, skin warm and dry. Pt has wounds, bruising and swelling to RLE and an ace wrap in place on LLE. 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 Pt lives with daughter and daughter is available by phone to update on health history.   R Recommendations: See Admitting Provider Note  Report given to:   Additional Notes: Pt recently DC'd from hospital. [name] INR was elevated upon discharge and am still waiting for pharmacy to send Vit K for reversal. Pt has had morphine 2 mg, she initially objected to that because she did not want to be confused. [name] daughter reports pt has been increasingly confused at home since DC. Purewick in place at this time. Pt is difficult IV stick.

## 2018-12-26 NOTE — Progress Notes (Signed)
Per MD Poggi and MD Stark Jock pt can be advanced to heart healthy diet. No surgery today.

## 2018-12-26 NOTE — TOC Initial Note (Signed)
Transition of Care [facility]) - Initial/Assessment Note    Patient Details  Name: [name] MRN: [id number] Date of Birth: 1934-07-08  Transition of Care [facility]) CM/SW Contact:    Latanya Maudlin, RN Phone Number: 12/26/2018, 8:43 AM  Clinical Narrative:  [facility] team consulted to complete high risk re admission assessment. Patient recently [facility]. Notified Jason admission. Patient also recently spent some time at [facility] for short term rehab. Patient coming from daughters home. Patient on chronic O2. Last admission patient received NIV, Aflo vest, and bedside commode. PCP is [name]. [facility]                  Expected Discharge Plan: [place] Barriers to Discharge: Continued Medical Work up   Patient Goals and CMS Choice     Choice offered to / list presented to : Patient  Expected Discharge Plan and Services Expected Discharge Plan: [place] In-house Referral: Clinical Social Work   Post Acute Care Choice: Home Health, Resumption of Svcs/PTA Provider Living arrangements for the past 2 months: Single Family Home                               Date [place]: 12/26/18 Time [place]: [phone] Representative spoke with at [place]: Corene Cornea  Prior Living Arrangements/Services Living arrangements for the past 2 months: [place] with:: Adult Children Patient language and need for interpreter reviewed:: Yes Do you feel safe going back to the place where you live?: Yes      Need for Family Participation in Patient Care: No (Comment)   Current home services: DME, Home PT, Home RN Criminal Activity/Legal Involvement Pertinent to Current Situation/Hospitalization: No - Comment as needed  Activities of Daily Living Home Assistive Devices/Equipment: Eyeglasses, Oxygen, Walker (specify type) ADL Screening (condition at time of  admission) Patient's cognitive ability adequate to safely complete daily activities?: Yes Is the patient deaf or have difficulty hearing?: No Does the patient have difficulty seeing, even when wearing glasses/contacts?: Yes Does the patient have difficulty concentrating, remembering, or making decisions?: No Patient able to express need for assistance with ADLs?: Yes Does the patient have difficulty dressing or bathing?: Yes Independently performs ADLs?: Yes (appropriate for developmental age) Does the patient have difficulty walking or climbing stairs?: Yes Weakness of Legs: Both Weakness of Arms/Hands: Both  Permission Sought/Granted Permission sought to share information with : Case Manager                Emotional Assessment Appearance:: Appears stated age Attitude/Demeanor/Rapport: Ambitious, Engaged Affect (typically observed): Pleasant, Quiet Orientation: : Oriented to Self, Oriented to Place, Oriented to  Time      Admission diagnosis:  Fall [W19.XXXA] Fall, initial encounter [id number]] Closed fracture of right hip, initial encounter [facility]) [S72.001A] Patient Active Problem List   Diagnosis Date Noted  . GERD (gastroesophageal reflux disease) 12/25/2018  . Closed right hip fracture ([place]) 12/25/2018  . Hip fracture ([place]) 12/25/2018  . Goals of care, counseling/discussion   . Palliative care by specialist   . Wound of left leg   . Persistent atrial fibrillation   . Atrial tachycardia ([place])   . Chronic kidney disease (CKD), stage IV (severe) ([place])   . Chest pain of uncertain etiology   . Traumatic hematoma of left lower leg 11/09/2018  .  Acute exacerbation of CHF (congestive heart failure) ([place]) 11/04/2018  . Pulmonary hypertension, unspecified ([place]) 11/04/2018  . S/P MVR (mitral valve replacement) 11/04/2018  . Encounter for therapeutic drug monitoring 09/09/2017  . Encounter for anticoagulation discussion and counseling 09/09/2017  . PAF (paroxysmal atrial  fibrillation) ([place]) 02/25/2017  . Chronic venous insufficiency 10/27/2016  . Shortness of breath 08/14/2016  . Pressure injury of skin 04/12/2016  . Malnutrition of moderate degree 04/11/2016  . Vertebral compression fracture ([place]) 04/10/2016  . Intractable pain 04/10/2016  . Spinal compression fracture ([place]) 04/10/2016  . Acute on chronic HFrEF (heart failure with reduced ejection fraction) ([place]) 11/19/2015  . Lymphedema   . Bilateral lower extremity edema 08/30/2015  . Acute on chronic renal failure ([place]) 04/28/2015  . Hyponatremia 04/28/2015  . History of mitral valve replacement with mechanical valve 11/04/2014  . Long term current use of anticoagulant therapy 09/28/2014  . Chronic combined systolic and diastolic CHF  72/90/2111  . 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 Generalized OA 11/28/2013  . HLD (hyperlipidemia) 11/28/2013   PCP:  Rusty Aus, MD Pharmacy:   [place], [place] - [address] [place] [address] Phone: [phone] Fax: [phone]     Social Determinants of Health (SDOH) Interventions    Readmission Risk Interventions Readmission Risk Prevention Plan 12/26/2018 12/07/2018 11/30/2018  Transportation Screening Complete Complete Complete  PCP or Specialist Appt within 3-5 Days - Complete -  HRI or Home Care Consult Complete Complete -  Social Work Consult for [place] Planning/Counseling Complete Not Complete -  SW consult not completed comments - NA -  Palliative Care Screening - Not Applicable -  Medication Review Press photographer) Complete Complete Complete  PCP or Specialist appointment within 3-5 days of discharge - - Complete  HRI or [place] - - Complete  SW Recovery Care/Counseling Consult - - Not Complete  SW Consult Not Complete Comments - - na  Palliative Care Screening - - Not [place] - - Complete  Some recent data might be hidden

## 2018-12-26 NOTE — Consult Note (Signed)
ORTHOPAEDIC CONSULTATION  REQUESTING PHYSICIAN: Otila Back, MD  Chief Complaint:   Right hip pain.  History of Present Illness: [name] is a 84 y.o. female with multiple medical problems including congestive heart failure, chronic kidney disease, hypertension, lymphedema, paroxysmal atrial fibrillation, status post mitral valve replacement requiring Coumadin for blood thinning, COPD, and osteoporosis with multiple prior lumbar compression fractures requiring kyphoplasty procedures.  The patient recently was hospitalized for congestive heart failure as well as for management of a hematoma of [name] left lower leg which was evacuated by general surgery and most recently has been covered with a porcine allograft which appears to be healing well, according to the patient and [name] daughter.  Apparently, the patient was trying to get either into or out of [name] vehicle when the edge of the door bumped [name] leg, causing [name] to fall onto [name] right hip.  She was brought to the emergency room where x-rays demonstrated an intertrochanteric fracture of [name] right hip.  The patient has been admitted to the hospitalist service for medical optimization and pain control in preparation for definitive management of this injury.  The patient denies any associated injuries.  She did not strike [name] head or lose consciousness.  In addition, she denies any lightheadedness, dizziness, chest pain, shortness of breath, or other symptoms which may have precipitated [name] fall.  Past Medical History:  Diagnosis Date  . Arthritis    "back, hands, knees" (04/10/2016)  . Chronic back pain   . 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 EF 25% by echo in 08/2015 b. RHC in 08/2015 showed normal filling pressures; c. 10/2018 Echo (in setting of atrial tachycardia): EF 25-30%, impaired relaxation. Glob HK. RVSP 67mmHg. Mod  dil RA/LA. Mech MV w/ nl fxn (mean grad 23mmHg). Mod TR.   Marland Kitchen Chronic kidney disease   . Compression fracture    "several; all in my back" (04/10/2016)  . GERD (gastroesophageal reflux disease)   . Hypertension   . Lymphedema   . Pacemaker 05/2017  . PAF (paroxysmal atrial fibrillation) ([place])    a. On amio & Coumadin - CHA2DS2VASc = 4.  . S/P MVR (mitral valve replacement)    a. MVR 1994 b. redo MVR in 08/2014 - on Coumadin  . Shortness of breath dyspnea    Past Surgical History:  Procedure Laterality Date  . BREAST LUMPECTOMY Right 2005?   benign lump excision  . CARDIAC CATHETERIZATION    . [place], 2016   "MVR; MVR"  . CARDIOVERSION N/A 11/06/2018   Procedure: CARDIOVERSION;  Surgeon: Minna Merritts, MD;  Location: [facility];  Service: Cardiovascular;  Laterality: N/A;  . CATARACT EXTRACTION W/PHACO Right 02/20/2015   Procedure: CATARACT EXTRACTION PHACO AND INTRAOCULAR LENS PLACEMENT ([place]);  Surgeon: Estill Cotta, MD;  Location: [facility];  Service: Ophthalmology;  Laterality: Right;  US01:31.2 AP 25.3% CDE40.13 Fluid lot # [id number]  . DRESSING CHANGE UNDER ANESTHESIA Left 11/17/2018   Procedure: DRESSING CHANGE;  Surgeon: Robert Bellow, MD;  Location: [facility];  Service: General;  Laterality: Left;  no anesthesia  . ELECTROPHYSIOLOGIC STUDY N/A 10/09/2015   Procedure: CARDIOVERSION;  Surgeon: Wellington Hampshire, MD;  Location: [facility];  Service: Cardiovascular;  Laterality: N/A;  . INCISION AND DRAINAGE OF WOUND Left 11/13/2018   Procedure: LEFT LEG DRESSING CHANGE;  Surgeon: Robert Bellow, MD;  Location: [facility];  Service: General;  Laterality: Left;  . INCISION AND  DRAINAGE OF WOUND Left 11/19/2018   Procedure: LEFT LEG DRESSING CHANGE AND GRAFT APPLICATION;  Surgeon: Robert Bellow, MD;  Location: [facility];  Service: General;  Laterality: Left;  . IR GENERIC HISTORICAL  04/01/2016   IR RADIOLOGIST EVAL & MGMT 04/01/2016 [facility]  .  IR GENERIC HISTORICAL  04/15/2016   IR VERTEBROPLASTY CERV/THOR BX INC UNI/BIL INC/INJECT/IMAGING 04/15/2016 Luanne Bras, MD [facility]  . IR GENERIC HISTORICAL  04/15/2016   IR VERTEBROPLASTY EA ADDL (T&LS) BX INC UNI/BIL INC INJECT/IMAGING 04/15/2016 Luanne Bras, MD [facility]  . IR GENERIC HISTORICAL  04/15/2016   IR VERTEBROPLASTY EA ADDL (T&LS) BX INC UNI/BIL INC INJECT/IMAGING 04/15/2016 Luanne Bras, MD [facility]  . IR GENERIC HISTORICAL  05/20/2016   IR RADIOLOGIST EVAL & MGMT 05/20/2016 [facility]  . IR GENERIC HISTORICAL  06/13/2016   IR VERTEBROPLASTY EA ADDL (T&LS) BX INC UNI/BIL INC INJECT/IMAGING 06/13/2016 Luanne Bras, MD [facility]  . IR GENERIC HISTORICAL  06/13/2016   IR SACROPLASTY BILATERAL 06/13/2016 Luanne Bras, MD [facility]  . IR GENERIC HISTORICAL  06/13/2016   IR VERTEBROPLASTY LUMBAR BX INC UNI/BIL INC/INJECT/IMAGING 06/13/2016 Luanne Bras, MD [facility]  . IRRIGATION AND DEBRIDEMENT HEMATOMA Left 07/05/2015   Procedure: IRRIGATION AND DEBRIDEMENT HEMATOMA;  Surgeon: Robert Bellow, MD;  Location: [facility];  Service: General;  Laterality: Left;  . IRRIGATION AND DEBRIDEMENT HEMATOMA Left 11/11/2018   Procedure: IRRIGATION AND DEBRIDEMENT LEFT LEG HEMATOMA;  Surgeon: Robert Bellow, MD;  Location: [facility];  Service: General;  Laterality: Left;  . pyloric stenosis  07/1937  . US ECHOCARDIOGRAPHY     Social History   Socioeconomic History  . Marital status: Widowed    Spouse name: Not on file  . Number of children: Not on file  . Years of education: Not on file  . Highest education level: Not on file  Occupational History  . Not on file  Social Needs  . Financial resource strain: Not on file  . Food insecurity    Worry: Not on file    Inability: Not on file  . Transportation needs    Medical: Not on file    Non-medical: Not on file  Tobacco Use  . Smoking status: Former Smoker    Packs/day:  1.00    Years: 27.00    Pack years: 27.00    Types: Cigarettes    Quit date: 07/01/1980    Years since quitting: 38.5  . Smokeless tobacco: Never Used  . Tobacco comment: "quit smoking ~ 1980  Substance and Sexual Activity  . Alcohol use: Not Currently    Alcohol/week: 0.0 standard drinks    Comment: 04/10/2016 "I'll have a drink on holidays/special occasions"  . Drug use: No  . Sexual activity: Never  Lifestyle  . Physical activity    Days per week: Not on file    Minutes per session: Not on file  . Stress: Not on file  Relationships  . Social Herbalist on phone: Not on file    Gets together: Not on file    Attends religious service: Not on file    Active member of club or organization: Not on file    Attends meetings of clubs or organizations: Not on file    Relationship status: Not on file  Other Topics Concern  . Not on file  Social History Narrative  . Not on file   Family History  Problem Relation Age of Onset  .  Stroke Mother   . Renal Disease Father    Allergies  Allergen Reactions  . Ace Inhibitors     Cough  . Doxycycline Nausea Only  . Hydrocodone Itching and Nausea Only  . Mercury     Other reaction(s): Unknown  . Silver Dermatitis    Severe itching  . Cephalexin Rash  . Clindamycin/Lincomycin Rash  . Nickel Rash  . Penicillins Rash    Has patient had a PCN reaction causing immediate rash, facial/tongue/throat swelling, SOB or lightheadedness with hypotension: YES Has patient had a PCN reaction causing severe rash involving mucus membranes or skin necrosis: NO Has patient had a PCN reaction that required hospitalization NO Has patient had a PCN reaction occurring within the last 10 years: NO If all of the above answers are "NO", then may proceed with Cephalosporin use.   Prior to Admission medications   Medication Sig Start Date End Date Taking? Authorizing Provider  acetaminophen (TYLENOL) 650 MG CR tablet Take 1 tablet (650 mg total)  by mouth every 6 (six) hours as needed for pain (mild and moderate pain). 11/30/18   Gladstone Lighter, MD  albuterol (PROVENTIL) (2.5 MG/3ML) 0.083% nebulizer solution Take 3 mLs (2.5 mg total) by nebulization every 6 (six) hours as needed for wheezing or shortness of breath. 11/30/18   Gladstone Lighter, MD  amiodarone (PACERONE) 200 MG tablet Take 1 tablet (200 mg total) by mouth daily. 09/24/18   Minna Merritts, MD  carvedilol (COREG) 3.125 MG tablet Take 1 tablet (3.125 mg total) by mouth 2 (two) times daily with a meal. 12/15/18   Gouru, Aruna, MD  Cholecalciferol (VITAMIN D) 2000 units tablet Take 2,000 Units by mouth daily.    [provider]  furosemide (LASIX) 40 MG tablet Take 1 tablet (40 mg total) by mouth 2 (two) times daily. 11/30/18   Gladstone Lighter, MD  hydrOXYzine (ATARAX/VISTARIL) 25 MG tablet Take 1 tablet (25 mg total) by mouth 3 (three) times daily as needed for itching or anxiety. 12/15/18   Gouru, Illene Silver, MD  ipratropium-albuterol (DUONEB) 0.5-2.5 (3) MG/3ML SOLN Take 3 mLs by nebulization every 6 (six) hours as needed for up to 90 doses. 12/15/18   Nicholes Mango, MD  losartan (COZAAR) 25 MG tablet Take 0.5 tablets (12.5 mg total) by mouth daily. 12/16/18   Gouru, Illene Silver, MD  Menthol (ICY HOT) 5 % PTCH Apply 1 patch topically daily as needed (pain).    [provider]  metolazone (ZAROXOLYN) 2.5 MG tablet Take 1 tablet (2.5 mg total) once daily on Monday Wednesday and Friday. 12/23/18   Theora Gianotti, NP  multivitamin-lutein [facility]) CAPS capsule Take 1 capsule by mouth daily. 12/16/18   Gouru, Illene Silver, MD  polyethylene glycol (MIRALAX / GLYCOLAX) 17 g packet Take 17 g by mouth daily. 11/30/18   Gladstone Lighter, MD  potassium chloride SA (K-DUR) 20 MEQ tablet Take 1 tablet (20 mEq total) by mouth daily. 11/30/18   Gladstone Lighter, MD  traMADol (ULTRAM) 50 MG tablet Take 1 tablet (50 mg total) by mouth every 6 (six) hours as needed for moderate  pain. 12/15/18   Nicholes Mango, MD  vitamin C (ASCORBIC ACID) 500 MG tablet Take 500 mg by mouth daily.    [provider]  warfarin (COUMADIN) 6 MG tablet Take 1 tablet (6 mg total) by mouth daily. 12/23/18   Theora Gianotti, NP  zinc sulfate 220 (50 Zn) MG capsule Take 1 capsule (220 mg total) by mouth daily. 12/01/18  Gladstone Lighter, MD   Dg Tibia/fibula Right  Result Date: 12/25/2018 CLINICAL DATA:  84 year old female with fall and right lower extremity pain. EXAM: RIGHT FEMUR 2 VIEWS; RIGHT TIBIA AND FIBULA - 2 VIEW; DG HIP (WITH OR WITHOUT PELVIS) 2-3V RIGHT COMPARISON:  None. FINDINGS: There is a comminuted and displaced intertrochanteric fracture of the right femur. There is no dislocation. The bones are osteopenic. There is osteoarthritic changes of the right knee. Calcified fibroids noted over the pelvis. There is mild diffuse subcutaneous edema and soft tissue contusion over the anterior shin. Vascular calcifications noted. IMPRESSION: Comminuted and displaced intertrochanteric fracture of the right femur. Electronically Signed   By: Anner Crete M.D.   On: 12/25/2018 23:11   Dg Chest Port 1 View  Result Date: 12/25/2018 CLINICAL DATA:  Fall EXAM: PORTABLE CHEST 1 VIEW COMPARISON:  12/08/2018, 12/07/2018, 12/06/2018, 12/06/2018 FINDINGS: Left-sided multi lead pacing device as before. Post sternotomy changes with valve prosthesis. Cardiomegaly with small moderate bilateral pleural effusions and basilar airspace disease. Vascular congestion with mild diffuse interstitial opacity consistent with underlying interstitial edema. Wedge-shaped consolidation in the right perihilar region, stable since 12/06/2018. IMPRESSION: 1. Continued cardiomegaly with vascular congestion and interstitial pulmonary edema. 2. Grossly stable small moderate left greater than right pleural effusions with airspace consolidation at both bases. No change in right perihilar wedge-shaped area of  consolidation. Electronically Signed   By: Donavan Foil M.D.   On: 12/25/2018 23:13   Dg Hip Unilat  With Pelvis 2-3 Views Right  Result Date: 12/25/2018 CLINICAL DATA:  84 year old female with fall and right lower extremity pain. EXAM: RIGHT FEMUR 2 VIEWS; RIGHT TIBIA AND FIBULA - 2 VIEW; DG HIP (WITH OR WITHOUT PELVIS) 2-3V RIGHT COMPARISON:  None. FINDINGS: There is a comminuted and displaced intertrochanteric fracture of the right femur. There is no dislocation. The bones are osteopenic. There is osteoarthritic changes of the right knee. Calcified fibroids noted over the pelvis. There is mild diffuse subcutaneous edema and soft tissue contusion over the anterior shin. Vascular calcifications noted. IMPRESSION: Comminuted and displaced intertrochanteric fracture of the right femur. Electronically Signed   By: Anner Crete M.D.   On: 12/25/2018 23:11   Dg Femur Min 2 Views Right  Result Date: 12/25/2018 CLINICAL DATA:  84 year old female with fall and right lower extremity pain. EXAM: RIGHT FEMUR 2 VIEWS; RIGHT TIBIA AND FIBULA - 2 VIEW; DG HIP (WITH OR WITHOUT PELVIS) 2-3V RIGHT COMPARISON:  None. FINDINGS: There is a comminuted and displaced intertrochanteric fracture of the right femur. There is no dislocation. The bones are osteopenic. There is osteoarthritic changes of the right knee. Calcified fibroids noted over the pelvis. There is mild diffuse subcutaneous edema and soft tissue contusion over the anterior shin. Vascular calcifications noted. IMPRESSION: Comminuted and displaced intertrochanteric fracture of the right femur. Electronically Signed   By: Anner Crete M.D.   On: 12/25/2018 23:11    Positive ROS: All other systems have been reviewed and were otherwise negative with the exception of those mentioned in the HPI and as above.  Physical Exam: General:  Alert, no acute distress Psychiatric:  Patient is competent for consent with normal mood and affect   Cardiovascular:  No  pedal edema Respiratory:  No wheezing, non-labored breathing GI:  Abdomen is soft and non-tender Skin:  No lesions in the area of chief complaint Neurologic:  Sensation intact distally Lymphatic:  No axillary or cervical lymphadenopathy  Orthopedic Exam:  Orthopedic examination is limited to the right hip  and lower extremity.  The right lower extremity is somewhat shortened and externally rotated as compared to the left.  Skin inspection of the right hip demonstrates a moderately sized area of bruising over the posterior lateral aspect of the left hip region.  She also has an area of ecchymosis over the right lateral lower leg region with a small superficial abrasion noted.  There is some serous drainage from this area, but no surrounding erythema is noted.  She has tenderness to palpation over the lateral aspect of the hip.  She has more severe pain with any attempted active or passive motion of the hip.  She is able dorsiflex and plantarflex [name] toes and ankle.  Sensations intact light touch to all distributions.  She has fair capillary refill to [name] right foot.  X-rays:  X-rays of the pelvis and right hip are available for review and have been reviewed by myself.  These films demonstrate a displaced 4 part intertrochanteric fracture of the right hip.  The hip joint itself appears to be well-maintained and without evidence for significant degenerative changes.  No lytic lesions are identified.  X-rays of the right tibia and fibula also are available for review and have been reviewed by myself.  These films demonstrate moderate degenerative changes of the knee, primarily involving the medial compartment, but no evidence for fractures or lytic lesions.  Assessment: Displaced comminuted intertrochanteric fracture, right hip.  Plan: The treatment options have been discussed with the patient and [name] daughter, who is at the bedside, including both surgical and nonsurgical choices.  The patient and [name]  daughter would like to proceed with surgical intervention to include an intramedullary nailing of the intertrochanteric right hip fracture.  The patient's family has requested that Dr. Marry Guan, whom they know from church, be the one to perform [name] surgery.  He is aware of this and he and I have communicated.  He will assume [name] care as of Monday and proceed with this operation when she has been cleared medically.  She will need to have [name] INR normalized and [name] Coumadin reversed.  She also will need to have [name] hemoglobin/hematocrit normalized, and to be optimized for surgery both from a cardiac and pulmonary standpoint.  Thank you for asking me to participate in the care of this most pleasant yet unfortunate woman.  Dr. Marry Guan and I will continue to watch [name] closely and proceed with surgery when she has been cleared medically.   Pascal Lux, MD  Beeper #:  [phone]  12/26/2018 5:43 PM

## 2018-12-26 NOTE — Consult Note (Addendum)
Pulmonary Medicine          Date: 12/26/2018,   MRN# [id number] [name] April 29, 1935     AdmissionWeight: 67.1 kg                 CurrentWeight: 67.1 kg      CHIEF COMPLAINT:   Chronic dyspnea with worsening due to acute hip fracture   HISTORY OF PRESENT ILLNESS   This is a pleasant 84 year old female well-known to our service she has a history of A. fib, systolic CHF with PPM, status post mitral valve replacement on anticoagulation, previous smoker for 30 years without COPD, recent large left lower extremity hematoma with surgical repair, osteoporotic, CKD.  Patient was brought to ED due to mechanical fall and hip pain which was imaged and found to have right displaced intertrocchanteric fracture of femur.   She uses oxygen for chronic hypoxemia at home currently 3 to 4 L/min nasal cannula and states that despite using home O2 she continue you to stay dyspneic with lower oxygen saturation.  She is chronically anticoagulated and had lost some blood due to hip fracture and is being transfused during my evaluation today. She is recently undergone plastic surgery evaluation with graft placement on left lower extremity which has caused her to become more deconditioned and immobile.   She was discharged from the hospital 2 weeks ago for acute systolic CHF exacerbation and was noted to have increased BNP at 1189 with most recent 505 in May 2020, she also had a chest imaging with x-ray showing significant pulmonary edema and pleural effusions.  Was asked to evaluate patient by hospitalist due to request of patient for pulmonary evaluation as well as persistent increased O2 requirement.  Blood work reveals CKD and acute blood loss anemia without leukocytosis.  She denies having constitutional symptoms denies febrile episodes, denies flulike illness, denies sick contacts denies travel history and has been tested for novel coronavirus repeatedly over the last few months and has been  negative.    PAST MEDICAL HISTORY   Past Medical History:  Diagnosis Date   Arthritis    "back, hands, knees" (04/10/2016)   Chronic back pain    Chronic combined systolic (congestive) and diastolic (congestive) heart failure (HCC)    a. EF 25% by echo in 08/2015 b. RHC in 08/2015 showed normal filling pressures; c. 10/2018 Echo (in setting of atrial tachycardia): EF 25-30%, impaired relaxation. Glob HK. RVSP 9mmHg. Mod dil RA/LA. Mech MV w/ nl fxn (mean grad 51mmHg). Mod TR.    Chronic kidney disease    Compression fracture    "several; all in my back" (04/10/2016)   GERD (gastroesophageal reflux disease)    Hypertension    Lymphedema    Pacemaker 05/2017   PAF (paroxysmal atrial fibrillation) ([place])    a. On amio & Coumadin - CHA2DS2VASc = 4.   S/P MVR (mitral valve replacement)    a. MVR 1994 b. redo MVR in 08/2014 - on Coumadin   Shortness of breath dyspnea      SURGICAL HISTORY   Past Surgical History:  Procedure Laterality Date   BREAST LUMPECTOMY Right 2005?   benign lump excision   CARDIAC CATHETERIZATION     CARDIAC VALVE REPLACEMENT  1994, 2016   "MVR; MVR"   CARDIOVERSION N/A 11/06/2018   Procedure: CARDIOVERSION;  Surgeon: Minna Merritts, MD;  Location: [facility];  Service: Cardiovascular;  Laterality: N/A;   CATARACT EXTRACTION W/PHACO Right 02/20/2015   Procedure:  CATARACT EXTRACTION PHACO AND INTRAOCULAR LENS PLACEMENT (IOC);  Surgeon: Estill Cotta, MD;  Location: [facility];  Service: Ophthalmology;  Laterality: Right;  US01:31.2 AP 25.3% CDE40.13 Fluid lot # [id number]   DRESSING CHANGE UNDER ANESTHESIA Left 11/17/2018   Procedure: DRESSING CHANGE;  Surgeon: Robert Bellow, MD;  Location: [facility];  Service: General;  Laterality: Left;  no anesthesia   ELECTROPHYSIOLOGIC STUDY N/A 10/09/2015   Procedure: CARDIOVERSION;  Surgeon: Wellington Hampshire, MD;  Location: [facility];  Service: Cardiovascular;  Laterality: N/A;   INCISION AND  DRAINAGE OF WOUND Left 11/13/2018   Procedure: LEFT LEG DRESSING CHANGE;  Surgeon: Robert Bellow, MD;  Location: [facility];  Service: General;  Laterality: Left;   INCISION AND DRAINAGE OF WOUND Left 11/19/2018   Procedure: LEFT LEG DRESSING CHANGE AND GRAFT APPLICATION;  Surgeon: Robert Bellow, MD;  Location: [facility];  Service: General;  Laterality: Left;   IR GENERIC HISTORICAL  04/01/2016   IR RADIOLOGIST EVAL & MGMT 04/01/2016 [facility]   IR GENERIC HISTORICAL  04/15/2016   IR VERTEBROPLASTY CERV/THOR BX INC UNI/BIL INC/INJECT/IMAGING 04/15/2016 Luanne Bras, MD [facility]   IR GENERIC HISTORICAL  04/15/2016   IR VERTEBROPLASTY EA ADDL (T&LS) BX INC UNI/BIL INC INJECT/IMAGING 04/15/2016 Luanne Bras, MD [facility]   IR GENERIC HISTORICAL  04/15/2016   IR VERTEBROPLASTY EA ADDL (T&LS) BX INC UNI/BIL INC INJECT/IMAGING 04/15/2016 Luanne Bras, MD [facility]   IR GENERIC HISTORICAL  05/20/2016   IR RADIOLOGIST EVAL & MGMT 05/20/2016 [facility]   IR GENERIC HISTORICAL  06/13/2016   IR VERTEBROPLASTY EA ADDL (T&LS) BX INC UNI/BIL INC INJECT/IMAGING 06/13/2016 Luanne Bras, MD [facility]   IR GENERIC HISTORICAL  06/13/2016   IR SACROPLASTY BILATERAL 06/13/2016 Luanne Bras, MD [facility]   IR GENERIC HISTORICAL  06/13/2016   IR VERTEBROPLASTY LUMBAR BX INC UNI/BIL INC/INJECT/IMAGING 06/13/2016 Luanne Bras, MD [facility]   IRRIGATION AND DEBRIDEMENT HEMATOMA Left 07/05/2015   Procedure: IRRIGATION AND DEBRIDEMENT HEMATOMA;  Surgeon: Robert Bellow, MD;  Location: [facility];  Service: General;  Laterality: Left;   IRRIGATION AND DEBRIDEMENT HEMATOMA Left 11/11/2018   Procedure: IRRIGATION AND DEBRIDEMENT LEFT LEG HEMATOMA;  Surgeon: Robert Bellow, MD;  Location: [facility];  Service: General;  Laterality: Left;   pyloric stenosis  07/1937   US ECHOCARDIOGRAPHY       FAMILY HISTORY   Family History  Problem  Relation Age of Onset   Stroke Mother    Renal Disease Father      SOCIAL HISTORY   Social History   Tobacco Use   Smoking status: Former Smoker    Packs/day: 1.00    Years: 27.00    Pack years: 27.00    Types: Cigarettes    Quit date: 07/01/1980    Years since quitting: 38.5   Smokeless tobacco: Never Used   Tobacco comment: "quit smoking ~ 1980  Substance Use Topics   Alcohol use: Not Currently    Alcohol/week: 0.0 standard drinks    Comment: 04/10/2016 "I'll have a drink on holidays/special occasions"   Drug use: No     MEDICATIONS    Home Medication:    Current Medication:  Current Facility-Administered Medications:    0.9 %  sodium chloride infusion (Manually program via Guardrails IV Fluids), , Intravenous, Once, [place]jie, Jude, MD   acetaminophen (TYLENOL) tablet 650 mg, 650 mg, Oral, Q6H PRN, 650 mg at 12/26/18 0251 **OR** acetaminophen (TYLENOL) suppository 650 mg, 650 mg,  Rectal, Q6H PRN, Lance Coon, MD   ondansetron [facility]) tablet 4 mg, 4 mg, Oral, Q6H PRN **OR** ondansetron (ZOFRAN) injection 4 mg, 4 mg, Intravenous, Q6H PRN, Lance Coon, MD   traMADol Veatrice Bourbon) tablet 50 mg, 50 mg, Oral, Q6H PRN, Lance Coon, MD, 50 mg at 12/26/18 1432    ALLERGIES   Ace inhibitors, Doxycycline, Hydrocodone, Mercury, Silver, Cephalexin, Clindamycin/lincomycin, Nickel, and Penicillins     REVIEW OF SYSTEMS    Review of Systems:  Gen:  Denies  fever, sweats, chills weigh loss  HEENT: Denies blurred vision, double vision, ear pain, eye pain, hearing loss, nose bleeds, sore throat Cardiac:  No dizziness, chest pain or heaviness, chest tightness,edema Resp:   Denies cough or sputum porduction, shortness of breath,wheezing, hemoptysis,  Gi: Denies swallowing difficulty, stomach pain, nausea or vomiting, diarrhea, constipation, bowel incontinence Gu:  Denies bladder incontinence, burning urine Ext:   Denies Joint pain, stiffness or swelling Skin: Denies   skin rash, easy bruising or bleeding or hives Endoc:  Denies polyuria, polydipsia , polyphagia or weight change Psych:   Denies depression, insomnia or hallucinations   Other:  All other systems negative   VS: BP 103/64 (BP Location: Left Arm)    Pulse 65    Temp 98 F (36.7 C) (Oral)    Resp 16    Ht 4\' 11"  (1.499 m)    Wt 67.1 kg    SpO2 99%    BMI 29.89 kg/m      PHYSICAL EXAM    GENERAL:NAD, no fevers, chills, no weakness no fatigue HEAD: Normocephalic, atraumatic.  EYES: Pupils equal, round, reactive to light. Extraocular muscles intact. No scleral icterus.  MOUTH: Moist mucosal membrane. Dentition intact. No abscess noted.  EAR, NOSE, THROAT: Clear without exudates. No external lesions.  NECK: Supple. No thyromegaly. No nodules. No JVD.  PULMONARY: Diffuse coarse rhonchi right sided +wheezes CARDIOVASCULAR: S1 and S2. Regular rate and rhythm. No murmurs, rubs, or gallops. No edema. Pedal pulses 2+ bilaterally.  GASTROINTESTINAL: Soft, nontender, nondistended. No masses. Positive bowel sounds. No hepatosplenomegaly.  MUSCULOSKELETAL: No swelling, clubbing, or edema. Range of motion full in all extremities.  NEUROLOGIC: Cranial nerves II through XII are intact. No gross focal neurological deficits. Sensation intact. Reflexes intact.  SKIN: No ulceration, lesions, rashes, or cyanosis. Skin warm and dry. Turgor intact.  PSYCHIATRIC: Mood, affect within normal limits. The patient is awake, alert and oriented x 3. Insight, judgment intact.       IMAGING    Dg Chest 1 View  Result Date: 12/08/2018 CLINICAL DATA:  Shortness of breath EXAM: CHEST  1 VIEW COMPARISON:  12/07/2018 FINDINGS: Prior median sternotomy and valve replacement. Left pacer remains in place, unchanged. Cardiomegaly with vascular congestion. Diffuse interstitial and airspace opacities lung with small effusions again noted. No real change. IMPRESSION: No significant change since prior study. Electronically Signed    By: Rolm Baptise M.D.   On: 12/08/2018 08:50   Dg Chest 1 View  Result Date: 12/07/2018 CLINICAL DATA:  Shortness of breath EXAM: CHEST  1 VIEW COMPARISON:  12/06/2018 FINDINGS: Cardiac shadow is enlarged but stable. Pacing device and postsurgical changes are again seen. Small effusions are again seen and stable. Patchy infiltrates are again noted right greater than left but stable from the prior exam. No new focal abnormality is noted. IMPRESSION: Stable airspace opacities right greater than left with associated small effusions. Electronically Signed   By: Inez Catalina M.D.   On: 12/07/2018 07:30  Dg Chest 1 View  Result Date: 12/06/2018 CLINICAL DATA:  Shortness of breath.  Lower extremity edema. EXAM: CHEST  1 VIEW COMPARISON:  Chest radiograph 12/05/2018 FINDINGS: Multi lead pacer apparatus overlies the left hemithorax. Leads are stable in position. Stable cardiomegaly. Similar-appearing patchy areas of consolidation within the mid and lower lungs bilaterally. Bilateral small to moderate pleural effusions. No pneumothorax. IMPRESSION: Cardiomegaly. Moderate bilateral pleural effusions with underlying consolidation favored to represent combination of atelectasis and edema. Superimposed infection not excluded. Electronically Signed   By: Lovey Newcomer M.D.   On: 12/06/2018 06:44   Dg Chest 2 View  Result Date: 12/06/2018 CLINICAL DATA:  84 year old female with history of hepatocellular carcinoma. Hypertension and shortness of breath. EXAM: CHEST - 2 VIEW COMPARISON:  Chest x-ray 11/13/2018. FINDINGS: Lung volumes are low. Moderate bilateral pleural effusions. Bibasilar areas of atelectasis and/or consolidation. Cephalization of the pulmonary vasculature with mild diffuse increased interstitial markings. Mild cardiomegaly. Upper mediastinal contours are within normal limits given the low lung volumes and patient rotation to the right. Aortic atherosclerosis. Status post median sternotomy. Left-sided  biventricular pacemaker in position with lead tips projecting over the expected location of the right atrium, right ventricular apex and left ventricle (via the coronary sinus and coronary veins). Status post mitral valve replacement. Post vertebroplasty changes in midthoracic vertebral bodies again noted. IMPRESSION: 1. The appearance the chest suggests worsening congestive heart failure, as above. 2. Bibasilar opacities may reflect areas of atelectasis and/or consolidation. 3. Aortic atherosclerosis. Electronically Signed   By: Vinnie Langton M.D.   On: 12/06/2018 19:48   Dg Tibia/fibula Right  Result Date: 12/25/2018 CLINICAL DATA:  84 year old female with fall and right lower extremity pain. EXAM: RIGHT FEMUR 2 VIEWS; RIGHT TIBIA AND FIBULA - 2 VIEW; DG HIP (WITH OR WITHOUT PELVIS) 2-3V RIGHT COMPARISON:  None. FINDINGS: There is a comminuted and displaced intertrochanteric fracture of the right femur. There is no dislocation. The bones are osteopenic. There is osteoarthritic changes of the right knee. Calcified fibroids noted over the pelvis. There is mild diffuse subcutaneous edema and soft tissue contusion over the anterior shin. Vascular calcifications noted. IMPRESSION: Comminuted and displaced intertrochanteric fracture of the right femur. Electronically Signed   By: Anner Crete M.D.   On: 12/25/2018 23:11   Dg Chest Port 1 View  Result Date: 12/25/2018 CLINICAL DATA:  Fall EXAM: PORTABLE CHEST 1 VIEW COMPARISON:  12/08/2018, 12/07/2018, 12/06/2018, 12/06/2018 FINDINGS: Left-sided multi lead pacing device as before. Post sternotomy changes with valve prosthesis. Cardiomegaly with small moderate bilateral pleural effusions and basilar airspace disease. Vascular congestion with mild diffuse interstitial opacity consistent with underlying interstitial edema. Wedge-shaped consolidation in the right perihilar region, stable since 12/06/2018. IMPRESSION: 1. Continued cardiomegaly with vascular  congestion and interstitial pulmonary edema. 2. Grossly stable small moderate left greater than right pleural effusions with airspace consolidation at both bases. No change in right perihilar wedge-shaped area of consolidation. Electronically Signed   By: Donavan Foil M.D.   On: 12/25/2018 23:13   Dg Chest Port 1 View  Result Date: 12/05/2018 CLINICAL DATA:  Congestive heart failure. EXAM: PORTABLE CHEST 1 VIEW COMPARISON:  11/13/2018 FINDINGS: There is a new airspace opacity that appears to be centered within the right middle lobe. The cardiac size is enlarged. The lung volumes are low. There are additional hazy airspace opacities bilaterally with prominent interstitial lung markings. There is a multi lead left-sided pacemaker in place. The patient is status post prior mitral valve replacement. There are  probable small bilateral pleural effusions. The patient appears to be status post multilevel vertebral augmentation. IMPRESSION: 1. Airspace opacity in the right middle lobe concerning for a developing infiltrate or atelectasis. 2. Cardiomegaly with volume overload and small bilateral pleural effusions. Electronically Signed   By: Constance Holster M.D.   On: 12/05/2018 03:24   Dg Hip Unilat  With Pelvis 2-3 Views Right  Result Date: 12/25/2018 CLINICAL DATA:  84 year old female with fall and right lower extremity pain. EXAM: RIGHT FEMUR 2 VIEWS; RIGHT TIBIA AND FIBULA - 2 VIEW; DG HIP (WITH OR WITHOUT PELVIS) 2-3V RIGHT COMPARISON:  None. FINDINGS: There is a comminuted and displaced intertrochanteric fracture of the right femur. There is no dislocation. The bones are osteopenic. There is osteoarthritic changes of the right knee. Calcified fibroids noted over the pelvis. There is mild diffuse subcutaneous edema and soft tissue contusion over the anterior shin. Vascular calcifications noted. IMPRESSION: Comminuted and displaced intertrochanteric fracture of the right femur. Electronically Signed   By:  Anner Crete M.D.   On: 12/25/2018 23:11   Dg Femur Min 2 Views Right  Result Date: 12/25/2018 CLINICAL DATA:  84 year old female with fall and right lower extremity pain. EXAM: RIGHT FEMUR 2 VIEWS; RIGHT TIBIA AND FIBULA - 2 VIEW; DG HIP (WITH OR WITHOUT PELVIS) 2-3V RIGHT COMPARISON:  None. FINDINGS: There is a comminuted and displaced intertrochanteric fracture of the right femur. There is no dislocation. The bones are osteopenic. There is osteoarthritic changes of the right knee. Calcified fibroids noted over the pelvis. There is mild diffuse subcutaneous edema and soft tissue contusion over the anterior shin. Vascular calcifications noted. IMPRESSION: Comminuted and displaced intertrochanteric fracture of the right femur. Electronically Signed   By: Anner Crete M.D.   On: 12/25/2018 23:11      ASSESSMENT/PLAN     Acute on chronic hypoxemic respiratory failure -Due to chronic systolic CHF with EF 25 to 30% -Cardiology on case appreciate input-follow recommendations -Currently being diuresed 80 mg IV twice daily of Lasix, will add IV albumin to increase efficacy -Complicated by acute blood loss anemia,  CKD - will ask nephro to evaluate patient as she is higher risk for progressing to ESRD, family asked about dialysis options this can be discussed with renal specialist.      Bilateral atelectasis  -Contributing to hypoxemia - chest PT - MetaNeb BID - Incentive spirometer to be used each hour multiple times - please encourage to use  - patient has difficulty expectorating phlegm - will ask RT to bring Acapella device as well      Moderate protein calorie malnutrition and deconditioning -Contributing to dyspnea -Patient working with physical therapy was able to get out of bed and slowly walk today but encouraged to continue this as well as outpatient -Nutritional assessment patient has been aspirating and has been evaluated with modified barium swallow recommended  aspiration precautions- will place consultation for dietitian eval     Thank you for allowing me to participate in the care of this patient.   Patient/Family are satisfied with care plan and all questions have been answered.  This document was prepared using Dragon voice recognition software and may include unintentional dictation errors.     Ottie Glazier, M.D.  Division of [place]

## 2018-12-26 NOTE — Consult Note (Signed)
CARDIOLOGY CONSULT NOTE     Primary Care Physician: Rusty Aus, MD Referring Physician:  Dr Stark Jock  Admit Date: 12/25/2018  Reason for consultation:  CHF  [name] is a 84 y.o. female with a h/o valvular heart disease, s/p mechanical MV replacement, nonischemic CM, HTN and afib now admitted with hip fracture.  The patient has had significant difficult with CHF over the past few months and has had multiple hospitalizations for CHF.  She now presents after having a mechanical fall with subsequent hip fracture.  She has significant hematoma of her thigh with a dramatic drop in hemaglobin.  Her coumadin is on hold and she is receiving PRBCs.  Her primary concern currently is with hip pain.  She is hoping for surgical repair soon.  She feels that her SOB is at her baseline. Today, she denies symptoms of palpitations, chest pain,  dizziness, presyncope, syncope, or neurologic sequela. The patient is tolerating medications without difficulties and is otherwise without complaint today.   Past Medical History:  Diagnosis Date  . Arthritis    "back, hands, knees" (04/10/2016)  . Chronic back pain   . 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 EF 25% by echo in 08/2015 b. RHC in 08/2015 showed normal filling pressures; c. 10/2018 Echo (in setting of atrial tachycardia): EF 25-30%, impaired relaxation. Glob HK. RVSP 22mmHg. Mod dil RA/LA. Mech MV w/ nl fxn (mean grad 38mmHg). Mod TR.   Marland Kitchen Chronic kidney disease   . Compression fracture    "several; all in my back" (04/10/2016)  . GERD (gastroesophageal reflux disease)   . Hypertension   . Lymphedema   . Pacemaker 05/2017  . PAF (paroxysmal atrial fibrillation) ([place])    a. On amio & Coumadin - CHA2DS2VASc = 4.  . S/P MVR (mitral valve replacement)    a. MVR 1994 b. redo MVR in 08/2014 - on Coumadin  . Shortness of breath dyspnea    Past Surgical History:  Procedure Laterality Date  .  BREAST LUMPECTOMY Right 2005?   benign lump excision  . CARDIAC CATHETERIZATION    . [place], 2016   "MVR; MVR"  . CARDIOVERSION N/A 11/06/2018   Procedure: CARDIOVERSION;  Surgeon: Minna Merritts, MD;  Location: [facility];  Service: Cardiovascular;  Laterality: N/A;  . CATARACT EXTRACTION W/PHACO Right 02/20/2015   Procedure: CATARACT EXTRACTION PHACO AND INTRAOCULAR LENS PLACEMENT ([place]);  Surgeon: Estill Cotta, MD;  Location: [facility];  Service: Ophthalmology;  Laterality: Right;  US01:31.2 AP 25.3% CDE40.13 Fluid lot # [id number]  . DRESSING CHANGE UNDER ANESTHESIA Left 11/17/2018   Procedure: DRESSING CHANGE;  Surgeon: Robert Bellow, MD;  Location: [facility];  Service: General;  Laterality: Left;  no anesthesia  . ELECTROPHYSIOLOGIC STUDY N/A 10/09/2015   Procedure: CARDIOVERSION;  Surgeon: Wellington Hampshire, MD;  Location: [facility];  Service: Cardiovascular;  Laterality: N/A;  . INCISION AND DRAINAGE OF WOUND Left 11/13/2018   Procedure: LEFT LEG DRESSING CHANGE;  Surgeon: Robert Bellow, MD;  Location: [facility];  Service: General;  Laterality: Left;  . INCISION AND DRAINAGE OF WOUND Left 11/19/2018   Procedure: LEFT LEG DRESSING CHANGE AND GRAFT APPLICATION;  Surgeon: Robert Bellow, MD;  Location: [facility];  Service: General;  Laterality: Left;  . IR GENERIC HISTORICAL  04/01/2016   IR RADIOLOGIST EVAL & MGMT 04/01/2016 [facility]  . IR GENERIC HISTORICAL  04/15/2016   IR VERTEBROPLASTY  CERV/THOR BX INC UNI/BIL INC/INJECT/IMAGING 04/15/2016 Luanne Bras, MD [facility]  . IR GENERIC HISTORICAL  04/15/2016   IR VERTEBROPLASTY EA ADDL (T&LS) BX INC UNI/BIL INC INJECT/IMAGING 04/15/2016 Luanne Bras, MD [facility]  . IR GENERIC HISTORICAL  04/15/2016   IR VERTEBROPLASTY EA ADDL (T&LS) BX INC UNI/BIL INC INJECT/IMAGING 04/15/2016 Luanne Bras, MD [facility]  . IR GENERIC HISTORICAL  05/20/2016   IR RADIOLOGIST EVAL & MGMT  05/20/2016 [facility]  . IR GENERIC HISTORICAL  06/13/2016   IR VERTEBROPLASTY EA ADDL (T&LS) BX INC UNI/BIL INC INJECT/IMAGING 06/13/2016 Luanne Bras, MD [facility]  . IR GENERIC HISTORICAL  06/13/2016   IR SACROPLASTY BILATERAL 06/13/2016 Luanne Bras, MD [facility]  . IR GENERIC HISTORICAL  06/13/2016   IR VERTEBROPLASTY LUMBAR BX INC UNI/BIL INC/INJECT/IMAGING 06/13/2016 Luanne Bras, MD [facility]  . IRRIGATION AND DEBRIDEMENT HEMATOMA Left 07/05/2015   Procedure: IRRIGATION AND DEBRIDEMENT HEMATOMA;  Surgeon: Robert Bellow, MD;  Location: [facility];  Service: General;  Laterality: Left;  . IRRIGATION AND DEBRIDEMENT HEMATOMA Left 11/11/2018   Procedure: IRRIGATION AND DEBRIDEMENT LEFT LEG HEMATOMA;  Surgeon: Robert Bellow, MD;  Location: [facility];  Service: General;  Laterality: Left;  . pyloric stenosis  07/1937  . US ECHOCARDIOGRAPHY     home medicines reviewed  Allergies  Allergen Reactions  . Ace Inhibitors     Cough  . Doxycycline Nausea Only  . Hydrocodone Itching and Nausea Only  . Mercury     Other reaction(s): Unknown  . Silver Dermatitis    Severe itching  . Cephalexin Rash  . Clindamycin/Lincomycin Rash  . Nickel Rash  . Penicillins Rash    Has patient had a PCN reaction causing immediate rash, facial/tongue/throat swelling, SOB or lightheadedness with hypotension: YES Has patient had a PCN reaction causing severe rash involving mucus membranes or skin necrosis: NO Has patient had a PCN reaction that required hospitalization NO Has patient had a PCN reaction occurring within the last 10 years: NO If all of the above answers are "NO", then may proceed with Cephalosporin use.    Social History   Socioeconomic History  . Marital status: Widowed    Spouse name: Not on file  . Number of children: Not on file  . Years of education: Not on file  . Highest education level: Not on file  Occupational History  . Not on file  Social  Needs  . Financial resource strain: Not on file  . Food insecurity    Worry: Not on file    Inability: Not on file  . Transportation needs    Medical: Not on file    Non-medical: Not on file  Tobacco Use  . Smoking status: Former Smoker    Packs/day: 1.00    Years: 27.00    Pack years: 27.00    Types: Cigarettes    Quit date: 07/01/1980    Years since quitting: 38.5  . Smokeless tobacco: Never Used  . Tobacco comment: "quit smoking ~ 1980  Substance and Sexual Activity  . Alcohol use: Not Currently    Alcohol/week: 0.0 standard drinks    Comment: 04/10/2016 "I'll have a drink on holidays/special occasions"  . Drug use: No  . Sexual activity: Never  Lifestyle  . Physical activity    Days per week: Not on file    Minutes per session: Not on file  . Stress: Not on file  Relationships  . Social Herbalist on phone:  Not on file    Gets together: Not on file    Attends religious service: Not on file    Active member of club or organization: Not on file    Attends meetings of clubs or organizations: Not on file    Relationship status: Not on file  . Intimate partner violence    Fear of current or ex partner: Not on file    Emotionally abused: Not on file    Physically abused: Not on file    Forced sexual activity: Not on file  Other Topics Concern  . Not on file  Social History Narrative  . Not on file    Family History  Problem Relation Age of Onset  . Stroke Mother   . Renal Disease Father     ROS- All systems are reviewed and negative except as per the HPI above  Physical Exam:  Vitals:   12/26/18 1250 12/26/18 1255 12/26/18 1315 12/26/18 1429  BP: (!) 89/46 (!) 90/45 (!) 91/46 (!) 107/55  Pulse: 66 66 66 68  Resp:  18 18   Temp: 98.4 F (36.9 C) 98.4 F (36.9 C) 98.3 F (36.8 C)   TempSrc: Oral Oral Oral   SpO2: 99% 100% 98% 98%  Weight:      Height:        GEN- The patient is chronically ill appearing, alert and oriented x 3 today.    Head- normocephalic, atraumatic Eyes-  Sclera clear, conjunctiva pink Ears- hearing intact Oropharynx- clear Neck- supple, + JVD Lungs- decreased BS at bases, normal work of breathing Heart- Regular rate and rhythm, mechanical S1 GI- soft, NT, ND, + BS Extremities- no clubbing, cyanosis, + dependant edema MS- diffuse atrophy Skin- no rash or lesion Psych- euthymic mood, full affect Neuro- strength and sensation are intact  EKG: 12/23/2018 reveals sinus rhythm with V pacing  Labs:   Lab Results  Component Value Date   WBC 8.4 12/26/2018   HGB 5.9 (L) 12/26/2018   HCT 20.0 (L) 12/26/2018   MCV 83.0 12/26/2018   PLT 175 12/26/2018    Recent Labs  Lab 12/25/18 2229 12/26/18 0719  NA 138 139  K 4.1 3.8  CL 96* 98  CO2 31 32  BUN 50* 53*  CREATININE 1.65* 1.67*  CALCIUM 9.4 9.0  PROT 6.8  --   BILITOT 0.8  --   ALKPHOS 107  --   ALT 14  --   AST 24  --   GLUCOSE 139* 111*   Lab Results  Component Value Date   CKTOTAL 73 03/22/2012   CKMB 0.9 03/22/2012   TROPONINI 0.04 ([place]) 12/05/2018   Radiology: CXR 12/25/18- + interstitial edema, sable bilateral pleural effusions, Hip films - comminutes and displaced intertorchanteric fracture of the R femur  Echo: 11/05/18- EF 25%, moderate biatrial enlargement, moderate TR,   ASSESSMENT AND PLAN:   1. Acute on chronic combined systolic and diastolic CHF/ nonischemic CM/ valvular heart disease  Medical therapy is currently limited by hypotension and acute blood loss anemia. Would give lasix IV after PRBCs if BP will allow.  Careful with IVF  We will try to resume CHF medicines post operatively if BP allows.  2. Valvular heart disease/ sp mechanical MV Coumadin is on hold due to acute blood loss anemia. Would start IV heparin bridge post operatively once bleeding has been corrected.  Resume coumadin once bleeding is corrected and she is stable from a surgical standpoint to resume.  3. Paroxysmal atrial  fibrillation  Continue on amiodarone home regimen Anticoagulation as above  4. HTN Currently hypotensive Home medicines are on hold   5. Stage III renal failure Stable No change required today  6. preop clearance The patient is chronically very high risk for any surgical procedures.  No further CV risk assessment would be beneficial.  I would advise correction of anemia and then proceed with surgery when able.  Resume heparin bridge post operatively given her mechanical valve and restart coumadin when able post operatively. Cardiology to be available  Very complicated patient.  A high level of decision making was required for this encounter today.  Thompson Grayer, MD 12/26/2018  2:47 PM

## 2018-12-26 NOTE — Progress Notes (Signed)
Consult received Will see patient in AM

## 2018-12-27 ENCOUNTER — Encounter
Admission: EM | Disposition: A | Discharge status: Other Acute Inpatient Hospital | Payer: Self-pay | Source: Home / Self Care | Attending: Internal Medicine

## 2018-12-27 ENCOUNTER — Inpatient Hospital Stay: Payer: Medicare Other

## 2018-12-27 LAB — CBC
HCT: 23.7 % — ABNORMAL LOW (ref 36.0–46.0)
Hemoglobin: 7.3 g/dL — ABNORMAL LOW (ref 12.0–15.0)
MCH: 26.4 pg (ref 26.0–34.0)
MCHC: 30.8 g/dL (ref 30.0–36.0)
MCV: 85.6 fL (ref 80.0–100.0)
Platelets: 182 10*3/uL (ref 150–400)
RBC: 2.77 MIL/uL — ABNORMAL LOW (ref 3.87–5.11)
RDW: 15.9 % — ABNORMAL HIGH (ref 11.5–15.5)
WBC: 12.6 10*3/uL — ABNORMAL HIGH (ref 4.0–10.5)
nRBC: 0.2 % (ref 0.0–0.2)

## 2018-12-27 LAB — BASIC METABOLIC PANEL
Anion gap: 9 (ref 5–15)
BUN: 56 mg/dL — ABNORMAL HIGH (ref 8–23)
CO2: 32 mmol/L (ref 22–32)
Calcium: 8.9 mg/dL (ref 8.9–10.3)
Chloride: 96 mmol/L — ABNORMAL LOW (ref 98–111)
Creatinine, Ser: 2.22 mg/dL — ABNORMAL HIGH (ref 0.44–1.00)
GFR calc Af Amer: 23 mL/min — ABNORMAL LOW (ref >60)
GFR calc non Af Amer: 20 mL/min — ABNORMAL LOW (ref >60)
Glucose, Bld: 111 mg/dL — ABNORMAL HIGH (ref 70–99)
Potassium: 4.4 mmol/L (ref 3.5–5.1)
Sodium: 137 mmol/L (ref 135–145)

## 2018-12-27 LAB — URINALYSIS, ROUTINE W REFLEX MICROSCOPIC
Bilirubin Urine: NEGATIVE
Glucose, UA: NEGATIVE mg/dL
Hgb urine dipstick: NEGATIVE
Ketones, ur: NEGATIVE mg/dL
Leukocytes,Ua: NEGATIVE
Nitrite: NEGATIVE
Protein, ur: 30 mg/dL — AB
Specific Gravity, Urine: 1.016 (ref 1.005–1.030)
Squamous Epithelial / HPF: NONE SEEN (ref 0–5)
pH: 5 (ref 5.0–8.0)

## 2018-12-27 LAB — PROTEIN / CREATININE RATIO, URINE
Creatinine, Urine: 151 mg/dL
Protein Creatinine Ratio: 0.23 mg/mg{Cre} — ABNORMAL HIGH (ref 0.00–0.15)
Total Protein, Urine: 35 mg/dL

## 2018-12-27 LAB — PROTIME-INR
INR: 1.2 (ref 0.8–1.2)
Prothrombin Time: 15.5 seconds — ABNORMAL HIGH (ref 11.4–15.2)

## 2018-12-27 LAB — MAGNESIUM: Magnesium: 2.4 mg/dL (ref 1.7–2.4)

## 2018-12-27 SURGERY — FIXATION, FRACTURE, INTERTROCHANTERIC, WITH INTRAMEDULLARY ROD
Anesthesia: Spinal | Laterality: Right

## 2018-12-27 MED ORDER — VITAMIN C 500 MG PO TABS
250.0000 mg | ORAL_TABLET | Freq: Two times a day (BID) | ORAL | Status: DC
  Administered 2018-12-27 – 2018-12-29 (×4): 250 mg via ORAL
  Filled 2018-12-27 (×4): qty 1

## 2018-12-27 MED ORDER — ENSURE ENLIVE PO LIQD
237.0000 mL | Freq: Two times a day (BID) | ORAL | Status: DC
  Administered 2018-12-27 – 2019-01-01 (×3): 237 mL via ORAL

## 2018-12-27 MED ORDER — IPRATROPIUM-ALBUTEROL 0.5-2.5 (3) MG/3ML IN SOLN
3.0000 mL | Freq: Four times a day (QID) | RESPIRATORY_TRACT | Status: DC
  Administered 2018-12-27 – 2018-12-28 (×4): 3 mL via RESPIRATORY_TRACT
  Filled 2018-12-27 (×4): qty 3

## 2018-12-27 MED ORDER — LOSARTAN POTASSIUM 25 MG PO TABS
12.5000 mg | ORAL_TABLET | Freq: Every day | ORAL | Status: DC
  Administered 2018-12-27: 12.5 mg via ORAL
  Filled 2018-12-27: qty 1
  Filled 2018-12-27: qty 0.5

## 2018-12-27 MED ORDER — VITAMIN D 25 MCG (1000 UNIT) PO TABS
2000.0000 [IU] | ORAL_TABLET | Freq: Every day | ORAL | Status: DC
  Administered 2018-12-27 – 2019-01-05 (×5): 2000 [IU] via ORAL
  Filled 2018-12-27 (×5): qty 2

## 2018-12-27 MED ORDER — FUROSEMIDE 10 MG/ML IJ SOLN
40.0000 mg | Freq: Two times a day (BID) | INTRAMUSCULAR | Status: DC
  Filled 2018-12-27: qty 4

## 2018-12-27 MED ORDER — MIDODRINE HCL 5 MG PO TABS
5.0000 mg | ORAL_TABLET | Freq: Three times a day (TID) | ORAL | Status: DC
  Administered 2018-12-27 – 2018-12-28 (×3): 5 mg via ORAL
  Filled 2018-12-27 (×4): qty 1

## 2018-12-27 MED ORDER — HEPARIN (PORCINE) 25000 UT/250ML-% IV SOLN
1000.0000 [IU]/h | INTRAVENOUS | Status: DC
  Administered 2018-12-27: 850 [IU]/h via INTRAVENOUS
  Filled 2018-12-27: qty 250

## 2018-12-27 MED ORDER — OCUVITE-LUTEIN PO CAPS
1.0000 | ORAL_CAPSULE | Freq: Every day | ORAL | Status: DC
  Administered 2018-12-27 – 2019-01-05 (×5): 1 via ORAL
  Filled 2018-12-27 (×11): qty 1

## 2018-12-27 MED ORDER — AMIODARONE HCL 200 MG PO TABS
200.0000 mg | ORAL_TABLET | Freq: Every day | ORAL | Status: DC
  Administered 2018-12-27 – 2019-01-05 (×8): 200 mg via ORAL
  Filled 2018-12-27 (×8): qty 1

## 2018-12-27 MED ORDER — ALBUMIN HUMAN 25 % IV SOLN
12.5000 g | Freq: Once | INTRAVENOUS | Status: AC
  Administered 2018-12-27: 12.5 g via INTRAVENOUS
  Filled 2018-12-27: qty 50

## 2018-12-27 MED ORDER — FUROSEMIDE 10 MG/ML IJ SOLN
4.0000 mg/h | INTRAVENOUS | Status: DC
  Administered 2018-12-27: 4 mg/h via INTRAVENOUS
  Filled 2018-12-27 (×2): qty 25

## 2018-12-27 MED ORDER — POLYETHYLENE GLYCOL 3350 17 G PO PACK
17.0000 g | PACK | Freq: Every day | ORAL | Status: DC
  Administered 2019-01-01: 17 g via ORAL
  Filled 2018-12-27: qty 1

## 2018-12-27 MED ORDER — CARVEDILOL 3.125 MG PO TABS
3.1250 mg | ORAL_TABLET | Freq: Two times a day (BID) | ORAL | Status: DC
  Administered 2018-12-27: 19:00:00 3.125 mg via ORAL
  Filled 2018-12-27 (×2): qty 1

## 2018-12-27 NOTE — Progress Notes (Signed)
ANTICOAGULATION CONSULT NOTE - Initial Consult  Pharmacy Consult for Heparin Drip Indication: mechanical Mitral Valve/A.fib  Allergies  Allergen Reactions  . Ace Inhibitors     Cough  . Doxycycline Nausea Only  . Hydrocodone Itching and Nausea Only  . Mercury     Other reaction(s): Unknown  . Silver Dermatitis    Severe itching  . Cephalexin Rash  . Clindamycin/Lincomycin Rash  . Nickel Rash  . Penicillins Rash    Has patient had a PCN reaction causing immediate rash, facial/tongue/throat swelling, SOB or lightheadedness with hypotension: YES Has patient had a PCN reaction causing severe rash involving mucus membranes or skin necrosis: NO Has patient had a PCN reaction that required hospitalization NO Has patient had a PCN reaction occurring within the last 10 years: NO If all of the above answers are "NO", then may proceed with Cephalosporin use.    Patient Measurements: Height: 4\' 11"  (149.9 cm) Weight: 148 lb (67.1 kg) IBW/kg (Calculated) : 43.2 Heparin Dosing Weight: 57.9kg  Vital Signs: Temp: 97.6 F (36.4 C) (06/28 0813) Temp Source: Oral (06/28 0813) BP: 112/49 (06/28 0813) Pulse Rate: 61 (06/28 0813)  Labs: Recent Labs    12/25/18 2229  12/26/18 0719 12/26/18 1534 12/26/18 2056 12/27/18 0740  HGB 8.0*  --  5.9*  --  7.8* 7.3*  HCT 27.2*  --  20.0*  --  25.0* 23.7*  PLT 182  --  175  --   --  182  LABPROT  --    < > 28.8* 21.1*  --  15.5*  INR  --    < > 2.8* 1.9*  --  1.2  CREATININE 1.65*  --  1.67*  --   --  2.22*   < > = values in this interval not displayed.    Estimated Creatinine Clearance: 16.6 mL/min (A) (by C-G formula based on SCr of 2.22 mg/dL (H)).   Medical History: Past Medical History:  Diagnosis Date  . Arthritis    "back, hands, knees" (04/10/2016)  . Chronic back pain   . 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 EF 25% by echo in 08/2015 b. RHC in 08/2015 showed normal  filling pressures; c. 10/2018 Echo (in setting of atrial tachycardia): EF 25-30%, impaired relaxation. Glob HK. RVSP 74mmHg. Mod dil RA/LA. Mech MV w/ nl fxn (mean grad 67mmHg). Mod TR.   Marland Kitchen Chronic kidney disease   . Compression fracture    "several; all in my back" (04/10/2016)  . GERD (gastroesophageal reflux disease)   . Hypertension   . Lymphedema   . Pacemaker 05/2017  . PAF (paroxysmal atrial fibrillation) ([place])    a. On amio & Coumadin - CHA2DS2VASc = 4.  . S/P MVR (mitral valve replacement)    a. MVR 1994 b. redo MVR in 08/2014 - on Coumadin  . Shortness of breath dyspnea     Medications:    Assessment: Closed right hip fracture secondary to mechanical fall  Coagulopathy was reversed with vitamin K and INR down to 1.2.-Patient on Coumadin prior to admission Paroxysmal atrial fibrillation and history of mechanical mitral valve; AV paced Rate controlled. Given history of mechanical mitral valve, Dr. Stark Jock discussed with cardiologist this morning Dr.  Rayann Heman.. Since no plans for surgery until after the weekend, he recommended protecting patient with heparin drip starting from today since no evidence of bleeding and hemoglobin remained stable..  Heparin drip can be discontinued prior to surgery since its  short acting. No BOLUS     Goal of Therapy:  Heparin level 0.3-0.7 units/ml Monitor platelets    Plan:  Start heparin infusion at 850 units/hr Check anti-Xa level in 8 hours and daily while on heparin Continue to monitor H&H and platelets  Nyna Chilton 12/27/2018,1:30 PM

## 2018-12-27 NOTE — Consult Note (Signed)
ORTHOPAEDICS:  84 year-old female fell Friday and sustained a right Intertrochanteric femur fracture. She has multiple medical co-morbidities and has been followed by Cardiology, Pulmonology, and Nephrology. She has a history of atrial fibrillation, CHF, mitral valve replacement, and chronic renal insufficiency. She is typically maintained on 3-4 l of O2 and anticoagulated with coumadin. Coumadin was reversed with vitamin K (INR of 1.2) and has been placed on a heparin drip by Cardiology.   I communicated with Dr. Candiss Norse (Nephrology) who has been working on diuresing the patient. Creatinine is 2.2 today. He suggested 1-2 days to continue gentle diuresis in order to optimize for surgery.  I also discussed the case with Dr. Ronelle Nigh (Anesthesiology). We will need coordination with regard to the type of anesthesia and timing for discontinuation of the heparin.  I anticipate scheduling the patient for ORIF of the right Intertrochanteric femur fracture on Wednesday. The findings were discussed in detail with the patient and her son. The usual perioperative course was discussed. The risks and benefits of surgical intervention were reviewed. The patient expressed understanding of the risks and benefits and agreed with plans for surgical intervention.   [name] M.D.

## 2018-12-27 NOTE — Progress Notes (Signed)
[facility], [place] 12/27/18  Subjective:   Patient known to our practice from previous admission in June.  This time she presents for right hip fracture post fall.  She has developed edema in her legs.  Nephrology consult has been requested for fluid optimization prior to surgery.  Has been treated with IV furosemide 40 mg twice a day.  She has low blood pressure.  She requires chronic oxygen at 4 L/min which has not changed.  She feels short of breath with exertion such as while talking on the phone.  Serum creatinine trends have been worsening.  Creatinine is up to 2.22 today.Marland Kitchen Results for [name], [name] (MRN [id number]) as of 12/27/2018 10:14  12/15/2018 03:38 12/23/2018 14:05 12/25/2018 22:29 12/26/2018 07:19 12/27/2018 07:40  Creatinine 1.37 (H) 1.76 (H) 1.65 (H) 1.67 (H) 2.22 (H)     Objective:  Vital signs in last 24 hours:  Temp:  [97.6 F (36.4 C)-98.4 F (36.9 C)] 97.6 F (36.4 C) (06/28 0813) Pulse Rate:  [61-68] 61 (06/28 0813) Resp:  [16-19] 18 (06/28 0813) BP: (89-114)/(45-76) 112/49 (06/28 0813) SpO2:  [96 %-100 %] 96 % (06/28 0813)  Weight change:  Filed Weights   12/25/18 2219  Weight: 67.1 kg    Intake/Output:    Intake/Output Summary (Last 24 hours) at 12/27/2018 1013 Last data filed at 12/27/2018 0000 Gross per 24 hour  Intake 890 ml  Output 650 ml  Net 240 ml     Physical Exam: General:  Frail elderly lady, laying in the bed, eating breakfast  HEENT  moist oral mucous membranes  Neck  supple no JVD,  Pulm/lungs  mild crackles at left base otherwise clear, 4 L O2 [place] continuous  CVS/Heart  irregular, harsh systolic murmur  Abdomen:   Soft, nontender  Extremities:  2-3+ pitting edema approximately over the thighs  Neurologic:  Alert, able to answer questions  Skin:  Right leg ecchymosis, resolving hematoma    Basic Metabolic Panel:  Recent Labs  Lab 12/23/18 1405 12/25/18 2229 12/26/18 0719 12/27/18 0740  NA 136  138 139 137  K 4.1 4.1 3.8 4.4  CL 94* 96* 98 96*  CO2 31* 31 32 32  GLUCOSE 88 139* 111* 111*  BUN 45* 50* 53* 56*  CREATININE 1.76* 1.65* 1.67* 2.22*  CALCIUM 9.4 9.4 9.0 8.9  MG  --   --   --  2.4     CBC: Recent Labs  Lab 12/25/18 2229 12/26/18 0719 12/26/18 2056 12/27/18 0740  WBC 6.3 8.4  --  12.6*  NEUTROABS 4.7  --   --   --   HGB 8.0* 5.9* 7.8* 7.3*  HCT 27.2* 20.0* 25.0* 23.7*  MCV 82.7 83.0  --  85.6  PLT 182 175  --  182     No results found for: HEPBSAG, HEPBSAB, HEPBIGM    Microbiology:  Recent Results (from the past 240 hour(s))  SARS Coronavirus 2 (CEPHEID- Performed in [place] hospital lab), Hosp Order     Status: None   Collection Time: 12/26/18 12:33 AM   Specimen: Nasopharyngeal Swab  Result Value Ref Range Status   SARS Coronavirus 2 NEGATIVE NEGATIVE Final    Comment: (NOTE) If result is NEGATIVE SARS-CoV-2 target nucleic acids are NOT DETECTED. The SARS-CoV-2 RNA is generally detectable in upper and lower  respiratory specimens during the acute phase of infection. The lowest  concentration of SARS-CoV-2 viral copies this assay can detect is 250  copies /  mL. A negative result does not preclude SARS-CoV-2 infection  and should not be used as the sole basis for treatment or other  patient management decisions.  A negative result may occur with  improper specimen collection / handling, submission of specimen other  than nasopharyngeal swab, presence of viral mutation(s) within the  areas targeted by this assay, and inadequate number of viral copies  (<250 copies / mL). A negative result must be combined with clinical  observations, patient history, and epidemiological information. If result is POSITIVE SARS-CoV-2 target nucleic acids are DETECTED. The SARS-CoV-2 RNA is generally detectable in upper and lower  respiratory specimens dur ing the acute phase of infection.  Positive  results are indicative of active infection with  SARS-CoV-2.  Clinical  correlation with patient history and other diagnostic information is  necessary to determine patient infection status.  Positive results do  not rule out bacterial infection or co-infection with other viruses. If result is PRESUMPTIVE POSTIVE SARS-CoV-2 nucleic acids MAY BE PRESENT.   A presumptive positive result was obtained on the submitted specimen  and confirmed on repeat testing.  While 2019 novel coronavirus  (SARS-CoV-2) nucleic acids may be present in the submitted sample  additional confirmatory testing may be necessary for epidemiological  and / or clinical management purposes  to differentiate between  SARS-CoV-2 and other Sarbecovirus currently known to infect humans.  If clinically indicated additional testing with an alternate test  methodology [id number]) is advised. The SARS-CoV-2 RNA is generally  detectable in upper and lower respiratory sp ecimens during the acute  phase of infection. The expected result is Negative. Fact Sheet for Patients:  StrictlyIdeas.no Fact Sheet for Healthcare Providers: BankingDealers.co.za This test is not yet approved or cleared by the [place] FDA and has been authorized for detection and/or diagnosis of SARS-CoV-2 by FDA under an Emergency Use Authorization (EUA).  This EUA will remain in effect (meaning this test can be used) for the duration of the COVID-19 declaration under Section 564(b)(1) of the Act, 21 U.S.C. section 360bbb-3(b)(1), unless the authorization is terminated or revoked sooner. Performed at [facility], [address] [place], [place] [address]   MRSA PCR Screening     Status: None   Collection Time: 12/26/18  1:54 AM   Specimen: Nasal Mucosa; Nasopharyngeal  Result Value Ref Range Status   MRSA by PCR NEGATIVE NEGATIVE Final    Comment:        The GeneXpert MRSA Assay (FDA approved for NASAL specimens only), is one component  of a comprehensive MRSA colonization surveillance program. It is not intended to diagnose MRSA infection nor to guide or monitor treatment for MRSA infections. Performed at [facility], [address] [place], [place] [address]     Coagulation Studies: Recent Labs    12/25/18 2230 12/26/18 0719 12/26/18 1534 12/27/18 0740  LABPROT 31.1* 28.8* 21.1* 15.5*  INR 3.1* 2.8* 1.9* 1.2    Urinalysis: No results for input(s): COLORURINE, LABSPEC, PHURINE, GLUCOSEU, HGBUR, BILIRUBINUR, KETONESUR, PROTEINUR, UROBILINOGEN, NITRITE, LEUKOCYTESUR in the last 72 hours.  Invalid input(s): APPERANCEUR    Imaging: Dg Tibia/fibula Right  Result Date: 12/25/2018 CLINICAL DATA:  84 year old female with fall and right lower extremity pain. EXAM: RIGHT FEMUR 2 VIEWS; RIGHT TIBIA AND FIBULA - 2 VIEW; DG HIP (WITH OR WITHOUT PELVIS) 2-3V RIGHT COMPARISON:  None. FINDINGS: There is a comminuted and displaced intertrochanteric fracture of the right femur. There is no dislocation. The bones are osteopenic. There is osteoarthritic  changes of the right knee. Calcified fibroids noted over the pelvis. There is mild diffuse subcutaneous edema and soft tissue contusion over the anterior shin. Vascular calcifications noted. IMPRESSION: Comminuted and displaced intertrochanteric fracture of the right femur. Electronically Signed   By: Anner Crete M.D.   On: 12/25/2018 23:11   Dg Chest Port 1 View  Result Date: 12/25/2018 CLINICAL DATA:  Fall EXAM: PORTABLE CHEST 1 VIEW COMPARISON:  12/08/2018, 12/07/2018, 12/06/2018, 12/06/2018 FINDINGS: Left-sided multi lead pacing device as before. Post sternotomy changes with valve prosthesis. Cardiomegaly with small moderate bilateral pleural effusions and basilar airspace disease. Vascular congestion with mild diffuse interstitial opacity consistent with underlying interstitial edema. Wedge-shaped consolidation in the right perihilar region, stable since  12/06/2018. IMPRESSION: 1. Continued cardiomegaly with vascular congestion and interstitial pulmonary edema. 2. Grossly stable small moderate left greater than right pleural effusions with airspace consolidation at both bases. No change in right perihilar wedge-shaped area of consolidation. Electronically Signed   By: Donavan Foil M.D.   On: 12/25/2018 23:13   Dg Hip Unilat  With Pelvis 2-3 Views Right  Result Date: 12/25/2018 CLINICAL DATA:  84 year old female with fall and right lower extremity pain. EXAM: RIGHT FEMUR 2 VIEWS; RIGHT TIBIA AND FIBULA - 2 VIEW; DG HIP (WITH OR WITHOUT PELVIS) 2-3V RIGHT COMPARISON:  None. FINDINGS: There is a comminuted and displaced intertrochanteric fracture of the right femur. There is no dislocation. The bones are osteopenic. There is osteoarthritic changes of the right knee. Calcified fibroids noted over the pelvis. There is mild diffuse subcutaneous edema and soft tissue contusion over the anterior shin. Vascular calcifications noted. IMPRESSION: Comminuted and displaced intertrochanteric fracture of the right femur. Electronically Signed   By: Anner Crete M.D.   On: 12/25/2018 23:11   Dg Femur Min 2 Views Right  Result Date: 12/25/2018 CLINICAL DATA:  84 year old female with fall and right lower extremity pain. EXAM: RIGHT FEMUR 2 VIEWS; RIGHT TIBIA AND FIBULA - 2 VIEW; DG HIP (WITH OR WITHOUT PELVIS) 2-3V RIGHT COMPARISON:  None. FINDINGS: There is a comminuted and displaced intertrochanteric fracture of the right femur. There is no dislocation. The bones are osteopenic. There is osteoarthritic changes of the right knee. Calcified fibroids noted over the pelvis. There is mild diffuse subcutaneous edema and soft tissue contusion over the anterior shin. Vascular calcifications noted. IMPRESSION: Comminuted and displaced intertrochanteric fracture of the right femur. Electronically Signed   By: Anner Crete M.D.   On: 12/25/2018 23:11     Medications:    . furosemide (LASIX) infusion     . sodium chloride   Intravenous Once  . amiodarone  200 mg Oral Daily  . cholecalciferol  2,000 Units Oral Daily  . feeding supplement (ENSURE ENLIVE)  237 mL Oral BID BM  . ipratropium-albuterol  3 mL Nebulization Q6H  . midodrine  5 mg Oral TID WC  . multivitamin-lutein  1 capsule Oral Daily  . polyethylene glycol  17 g Oral Daily  . vitamin C  250 mg Oral BID   acetaminophen **OR** acetaminophen, ondansetron **OR** ondansetron (ZOFRAN) IV, traMADol  Assessment/ Plan:  84 y.o.Caucasian  female with congestive heart failure EF 25%, chronic kidney disease, hypertension, atrial fibrillation, lymphedema, mitral valve replacement (mechanical valve) requiring chronic anticoagulation, COPD, osteoporosis, history of multiple lumbar compression fractures requiring kyphoplasty, history of left leg hematoma with evacuation and porcine allograft, was admitted on 12/25/2018 with right hip fracture.   1.  Acute kidney injury, chronic kidney disease stage III.  Baseline 1.37/GFR 36 from December 15, 2018 2.  Chronic systolic congestive heart failure with EF 25% 3.  Lower extremity edema with third spacing 4.  Pulmonary hypertension 5.  Mechanical heart valve requiring chronic anticoagulation  Diuresis has been very difficult due to patient's chronic systolic congestive heart failure and low blood pressure.  She has tissue edema.  So far she has been treated with IV furosemide 40 mg bolus twice a day.  It might have resulted in worsening creatinine trends.  Urine output is relatively low at 650 cc Plan: Change IV furosemide to low-dose Lasix infusion Add midodrine 5 mg 3 times a day to increase blood pressure response Obtain urinalysis/urine protein to creatinine ratio Obtain renal ultrasound Patient's family brought up the possibility of fluid removal with dialysis.  That would be standard only as a last resort as volume removal will be difficult due to her cardiac  status.    LOS: [place] 6/28/202010:13 AM  [place], [place]  Note: This note was prepared with Dragon dictation. Any transcription errors are unintentional

## 2018-12-27 NOTE — Progress Notes (Addendum)
Pulmonary Medicine          Date: 12/27/2018,   MRN# [id number] [name] Nov 23, 1934     AdmissionWeight: 67.1 kg                 CurrentWeight: 67.1 kg        SUBJECT   Patient is in good spirits sitting up and doing MetaNeb therapy with respiratory therapist during my evaluation.  Now down to home settings in terms of oxygen use.   Son [name] is at bedside we had discussed current care plan and goals.   Orthopedics workup in progress, plan for hip repair surgery  Wednesday. Pulmonary pre-operative evaluation included in A&P below.   Cardiology on case optimizing CHF status.   Fluid removal in progress with nephrology on case, Lasix and Midodrine for BP support.    Have asked patient not to hang up on dietitian and to take nourishment seriously as she will have multiple wounds requiring healing and is overall very deconditioned with protein calorie malnutrition.    PAST MEDICAL HISTORY   Past Medical History:  Diagnosis Date  . Arthritis    "back, hands, knees" (04/10/2016)  . Chronic back pain   . 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 EF 25% by echo in 08/2015 b. RHC in 08/2015 showed normal filling pressures; c. 10/2018 Echo (in setting of atrial tachycardia): EF 25-30%, impaired relaxation. Glob HK. RVSP 62mmHg. Mod dil RA/LA. Mech MV w/ nl fxn (mean grad 3mmHg). Mod TR.   Marland Kitchen Chronic kidney disease   . Compression fracture    "several; all in my back" (04/10/2016)  . GERD (gastroesophageal reflux disease)   . Hypertension   . Lymphedema   . Pacemaker 05/2017  . PAF (paroxysmal atrial fibrillation) ([place])    a. On amio & Coumadin - CHA2DS2VASc = 4.  . S/P MVR (mitral valve replacement)    a. MVR 1994 b. redo MVR in 08/2014 - on Coumadin  . Shortness of breath dyspnea      SURGICAL HISTORY   Past Surgical History:  Procedure Laterality Date  . BREAST LUMPECTOMY Right 2005?   benign  lump excision  . CARDIAC CATHETERIZATION    . [place], 2016   "MVR; MVR"  . CARDIOVERSION N/A 11/06/2018   Procedure: CARDIOVERSION;  Surgeon: Minna Merritts, MD;  Location: [facility];  Service: Cardiovascular;  Laterality: N/A;  . CATARACT EXTRACTION W/PHACO Right 02/20/2015   Procedure: CATARACT EXTRACTION PHACO AND INTRAOCULAR LENS PLACEMENT ([place]);  Surgeon: Estill Cotta, MD;  Location: [facility];  Service: Ophthalmology;  Laterality: Right;  US01:31.2 AP 25.3% CDE40.13 Fluid lot # [id number]  . DRESSING CHANGE UNDER ANESTHESIA Left 11/17/2018   Procedure: DRESSING CHANGE;  Surgeon: Robert Bellow, MD;  Location: [facility];  Service: General;  Laterality: Left;  no anesthesia  . ELECTROPHYSIOLOGIC STUDY N/A 10/09/2015   Procedure: CARDIOVERSION;  Surgeon: Wellington Hampshire, MD;  Location: [facility];  Service: Cardiovascular;  Laterality: N/A;  . INCISION AND DRAINAGE OF WOUND Left 11/13/2018   Procedure: LEFT LEG DRESSING CHANGE;  Surgeon: Robert Bellow, MD;  Location: [facility];  Service: General;  Laterality: Left;  . INCISION AND DRAINAGE OF WOUND Left 11/19/2018   Procedure: LEFT LEG DRESSING CHANGE AND GRAFT APPLICATION;  Surgeon: Robert Bellow, MD;  Location: [facility];  Service: General;  Laterality: Left;  . IR GENERIC HISTORICAL  04/01/2016  IR RADIOLOGIST EVAL & MGMT 04/01/2016 [facility]  . IR GENERIC HISTORICAL  04/15/2016   IR VERTEBROPLASTY CERV/THOR BX INC UNI/BIL INC/INJECT/IMAGING 04/15/2016 Luanne Bras, MD [facility]  . IR GENERIC HISTORICAL  04/15/2016   IR VERTEBROPLASTY EA ADDL (T&LS) BX INC UNI/BIL INC INJECT/IMAGING 04/15/2016 Luanne Bras, MD [facility]  . IR GENERIC HISTORICAL  04/15/2016   IR VERTEBROPLASTY EA ADDL (T&LS) BX INC UNI/BIL INC INJECT/IMAGING 04/15/2016 Luanne Bras, MD [facility]  . IR GENERIC HISTORICAL  05/20/2016   IR RADIOLOGIST EVAL & MGMT 05/20/2016 [facility]  . IR GENERIC  HISTORICAL  06/13/2016   IR VERTEBROPLASTY EA ADDL (T&LS) BX INC UNI/BIL INC INJECT/IMAGING 06/13/2016 Luanne Bras, MD [facility]  . IR GENERIC HISTORICAL  06/13/2016   IR SACROPLASTY BILATERAL 06/13/2016 Luanne Bras, MD [facility]  . IR GENERIC HISTORICAL  06/13/2016   IR VERTEBROPLASTY LUMBAR BX INC UNI/BIL INC/INJECT/IMAGING 06/13/2016 Luanne Bras, MD [facility]  . IRRIGATION AND DEBRIDEMENT HEMATOMA Left 07/05/2015   Procedure: IRRIGATION AND DEBRIDEMENT HEMATOMA;  Surgeon: Robert Bellow, MD;  Location: [facility];  Service: General;  Laterality: Left;  . IRRIGATION AND DEBRIDEMENT HEMATOMA Left 11/11/2018   Procedure: IRRIGATION AND DEBRIDEMENT LEFT LEG HEMATOMA;  Surgeon: Robert Bellow, MD;  Location: [facility];  Service: General;  Laterality: Left;  . pyloric stenosis  07/1937  . US ECHOCARDIOGRAPHY       FAMILY HISTORY   Family History  Problem Relation Age of Onset  . Stroke Mother   . Renal Disease Father      SOCIAL HISTORY   Social History   Tobacco Use  . Smoking status: Former Smoker    Packs/day: 1.00    Years: 27.00    Pack years: 27.00    Types: Cigarettes    Quit date: 07/01/1980    Years since quitting: 38.5  . Smokeless tobacco: Never Used  . Tobacco comment: "quit smoking ~ 1980  Substance Use Topics  . Alcohol use: Not Currently    Alcohol/week: 0.0 standard drinks    Comment: 04/10/2016 "I'll have a drink on holidays/special occasions"  . Drug use: No     MEDICATIONS    Home Medication:    Current Medication:  Current Facility-Administered Medications:  .  0.9 %  sodium chloride infusion (Manually program via Guardrails IV Fluids), , Intravenous, Once, Ojie, Jude, MD .  acetaminophen (TYLENOL) tablet 650 mg, 650 mg, Oral, Q6H PRN, 650 mg at 12/27/18 0804 **OR** acetaminophen (TYLENOL) suppository 650 mg, 650 mg, Rectal, Q6H PRN, Lance Coon, MD .  amiodarone (PACERONE) tablet 200 mg, 200 mg, Oral, Daily,  Ojie, Jude, MD, 200 mg at 12/27/18 1126 .  carvedilol (COREG) tablet 3.125 mg, 3.125 mg, Oral, BID WC, Ojie, Jude, MD .  cholecalciferol (VITAMIN D3) tablet 2,000 Units, 2,000 Units, Oral, Daily, Ojie, Jude, MD, 2,000 Units at 12/27/18 1126 .  feeding supplement (ENSURE ENLIVE) (ENSURE ENLIVE) liquid 237 mL, 237 mL, Oral, BID BM, Ojie, Jude, MD .  furosemide (LASIX) 250 mg in dextrose 5 % 250 mL (1 mg/mL) infusion, 4 mg/hr, Intravenous, Continuous, Singh, Harmeet, MD, Last Rate: 4 mL/hr at 12/27/18 1142, 4 mg/hr at 12/27/18 1142 .  heparin ADULT infusion 100 units/mL (25000 units/267mL sodium chloride 0.45%), 850 Units/hr, Intravenous, Continuous, Slaughter, Myra G, RPH .  ipratropium-albuterol (DUONEB) 0.5-2.5 (3) MG/3ML nebulizer solution 3 mL, 3 mL, Nebulization, Q6H, Ojie, Jude, MD .  losartan (COZAAR) tablet 12.5 mg, 12.5 mg, Oral, Daily, Ojie, Jude, MD, 12.5  mg at 12/27/18 1242 .  midodrine (PROAMATINE) tablet 5 mg, 5 mg, Oral, TID WC, Candiss Norse, Harmeet, MD, 5 mg at 12/27/18 1242 .  multivitamin-lutein (OCUVITE-LUTEIN) capsule 1 capsule, 1 capsule, Oral, Daily, Ojie, Jude, MD, 1 capsule at 12/27/18 1127 .  ondansetron (ZOFRAN) tablet 4 mg, 4 mg, Oral, Q6H PRN **OR** ondansetron (ZOFRAN) injection 4 mg, 4 mg, Intravenous, Q6H PRN, Lance Coon, MD .  polyethylene glycol (MIRALAX / GLYCOLAX) packet 17 g, 17 g, Oral, Daily, Ojie, Jude, MD .  traMADol (ULTRAM) tablet 50 mg, 50 mg, Oral, Q6H PRN, Lance Coon, MD, 50 mg at 12/26/18 2042 .  vitamin C (ASCORBIC ACID) tablet 250 mg, 250 mg, Oral, BID, Ojie, Jude, MD    ALLERGIES   Ace inhibitors, Doxycycline, Hydrocodone, Mercury, Silver, Cephalexin, Clindamycin/lincomycin, Nickel, and Penicillins     REVIEW OF SYSTEMS    Review of Systems:  Gen:  Denies  fever, sweats, chills weigh loss  HEENT: Denies blurred vision, double vision, ear pain, eye pain, hearing loss, nose bleeds, sore throat Cardiac:  No dizziness, chest pain or heaviness,  chest tightness,edema Resp:   Denies cough or sputum porduction, shortness of breath,wheezing, hemoptysis,  Gi: Denies swallowing difficulty, stomach pain, nausea or vomiting, diarrhea, constipation, bowel incontinence Gu:  Denies bladder incontinence, burning urine Ext:   Denies Joint pain, stiffness or swelling Skin: Denies  skin rash, easy bruising or bleeding or hives Endoc:  Denies polyuria, polydipsia , polyphagia or weight change Psych:   Denies depression, insomnia or hallucinations   Other:  All other systems negative   VS: BP (!) 112/49 (BP Location: Left Arm)   Pulse 61   Temp 97.6 F (36.4 C) (Oral)   Resp 18   Ht 4\' 11"  (1.499 m)   Wt 67.1 kg   SpO2 96%   BMI 29.89 kg/m      PHYSICAL EXAM    GENERAL:NAD, no fevers, chills, no weakness no fatigue HEAD: Normocephalic, atraumatic.  EYES: Pupils equal, round, reactive to light. Extraocular muscles intact. No scleral icterus.  MOUTH: Moist mucosal membrane. Dentition intact. No abscess noted.  EAR, NOSE, THROAT: Clear without exudates. No external lesions.  NECK: Supple. No thyromegaly. No nodules. No JVD.  PULMONARY: Diffuse coarse rhonchi right sided +wheezes CARDIOVASCULAR: S1 and S2. Regular rate and rhythm. No murmurs, rubs, or gallops. No edema. Pedal pulses 2+ bilaterally.  GASTROINTESTINAL: Soft, nontender, nondistended. No masses. Positive bowel sounds. No hepatosplenomegaly.  MUSCULOSKELETAL: No swelling, clubbing, or edema. Range of motion full in all extremities.  NEUROLOGIC: Cranial nerves II through XII are intact. No gross focal neurological deficits. Sensation intact. Reflexes intact.  SKIN: No ulceration, lesions, rashes, or cyanosis. Skin warm and dry. Turgor intact.  PSYCHIATRIC: Mood, affect within normal limits. The patient is awake, alert and oriented x 3. Insight, judgment intact.       IMAGING    Dg Chest 1 View  Result Date: 12/08/2018 CLINICAL DATA:  Shortness of breath EXAM: CHEST  1  VIEW COMPARISON:  12/07/2018 FINDINGS: Prior median sternotomy and valve replacement. Left pacer remains in place, unchanged. Cardiomegaly with vascular congestion. Diffuse interstitial and airspace opacities lung with small effusions again noted. No real change. IMPRESSION: No significant change since prior study. Electronically Signed   By: Rolm Baptise M.D.   On: 12/08/2018 08:50   Dg Chest 1 View  Result Date: 12/07/2018 CLINICAL DATA:  Shortness of breath EXAM: CHEST  1 VIEW COMPARISON:  12/06/2018 FINDINGS: Cardiac shadow is enlarged but stable.  Pacing device and postsurgical changes are again seen. Small effusions are again seen and stable. Patchy infiltrates are again noted right greater than left but stable from the prior exam. No new focal abnormality is noted. IMPRESSION: Stable airspace opacities right greater than left with associated small effusions. Electronically Signed   By: Inez Catalina M.D.   On: 12/07/2018 07:30   Dg Chest 1 View  Result Date: 12/06/2018 CLINICAL DATA:  Shortness of breath.  Lower extremity edema. EXAM: CHEST  1 VIEW COMPARISON:  Chest radiograph 12/05/2018 FINDINGS: Multi lead pacer apparatus overlies the left hemithorax. Leads are stable in position. Stable cardiomegaly. Similar-appearing patchy areas of consolidation within the mid and lower lungs bilaterally. Bilateral small to moderate pleural effusions. No pneumothorax. IMPRESSION: Cardiomegaly. Moderate bilateral pleural effusions with underlying consolidation favored to represent combination of atelectasis and edema. Superimposed infection not excluded. Electronically Signed   By: Lovey Newcomer M.D.   On: 12/06/2018 06:44   Dg Chest 2 View  Result Date: 12/06/2018 CLINICAL DATA:  84 year old female with history of hepatocellular carcinoma. Hypertension and shortness of breath. EXAM: CHEST - 2 VIEW COMPARISON:  Chest x-ray 11/13/2018. FINDINGS: Lung volumes are low. Moderate bilateral pleural effusions. Bibasilar  areas of atelectasis and/or consolidation. Cephalization of the pulmonary vasculature with mild diffuse increased interstitial markings. Mild cardiomegaly. Upper mediastinal contours are within normal limits given the low lung volumes and patient rotation to the right. Aortic atherosclerosis. Status post median sternotomy. Left-sided biventricular pacemaker in position with lead tips projecting over the expected location of the right atrium, right ventricular apex and left ventricle (via the coronary sinus and coronary veins). Status post mitral valve replacement. Post vertebroplasty changes in midthoracic vertebral bodies again noted. IMPRESSION: 1. The appearance the chest suggests worsening congestive heart failure, as above. 2. Bibasilar opacities may reflect areas of atelectasis and/or consolidation. 3. Aortic atherosclerosis. Electronically Signed   By: Vinnie Langton M.D.   On: 12/06/2018 19:48   Dg Tibia/fibula Right  Result Date: 12/25/2018 CLINICAL DATA:  84 year old female with fall and right lower extremity pain. EXAM: RIGHT FEMUR 2 VIEWS; RIGHT TIBIA AND FIBULA - 2 VIEW; DG HIP (WITH OR WITHOUT PELVIS) 2-3V RIGHT COMPARISON:  None. FINDINGS: There is a comminuted and displaced intertrochanteric fracture of the right femur. There is no dislocation. The bones are osteopenic. There is osteoarthritic changes of the right knee. Calcified fibroids noted over the pelvis. There is mild diffuse subcutaneous edema and soft tissue contusion over the anterior shin. Vascular calcifications noted. IMPRESSION: Comminuted and displaced intertrochanteric fracture of the right femur. Electronically Signed   By: Anner Crete M.D.   On: 12/25/2018 23:11   US Renal  Result Date: 12/27/2018 CLINICAL DATA:  Acute renal failure EXAM: RENAL / URINARY TRACT ULTRASOUND COMPLETE COMPARISON:  None. FINDINGS: Right Kidney: Renal measurements: 9.6 x 4.5 x 4.4 cm = volume: 101 mL. Cortical thinning. Cortical echogenicity  is within normal limits. No mass or hydronephrosis visualized. Left Kidney: Renal measurements: 9.4 x 5.6 x 4.7 cm = volume: 129 mL. Echogenicity within normal limits. No mass or hydronephrosis visualized. Bladder: Appears normal for degree of bladder distention. IMPRESSION: 1. No acute findings.  No hydronephrosis bilaterally. 2. Cortical echogenicity within normal limits bilaterally. At least mild cortical thinning on the RIGHT. Electronically Signed   By: Franki Cabot M.D.   On: 12/27/2018 11:50   Dg Chest Port 1 View  Result Date: 12/25/2018 CLINICAL DATA:  Fall EXAM: PORTABLE CHEST 1 VIEW COMPARISON:  12/08/2018,  12/07/2018, 12/06/2018, 12/06/2018 FINDINGS: Left-sided multi lead pacing device as before. Post sternotomy changes with valve prosthesis. Cardiomegaly with small moderate bilateral pleural effusions and basilar airspace disease. Vascular congestion with mild diffuse interstitial opacity consistent with underlying interstitial edema. Wedge-shaped consolidation in the right perihilar region, stable since 12/06/2018. IMPRESSION: 1. Continued cardiomegaly with vascular congestion and interstitial pulmonary edema. 2. Grossly stable small moderate left greater than right pleural effusions with airspace consolidation at both bases. No change in right perihilar wedge-shaped area of consolidation. Electronically Signed   By: Donavan Foil M.D.   On: 12/25/2018 23:13   Dg Chest Port 1 View  Result Date: 12/05/2018 CLINICAL DATA:  Congestive heart failure. EXAM: PORTABLE CHEST 1 VIEW COMPARISON:  11/13/2018 FINDINGS: There is a new airspace opacity that appears to be centered within the right middle lobe. The cardiac size is enlarged. The lung volumes are low. There are additional hazy airspace opacities bilaterally with prominent interstitial lung markings. There is a multi lead left-sided pacemaker in place. The patient is status post prior mitral valve replacement. There are probable small bilateral  pleural effusions. The patient appears to be status post multilevel vertebral augmentation. IMPRESSION: 1. Airspace opacity in the right middle lobe concerning for a developing infiltrate or atelectasis. 2. Cardiomegaly with volume overload and small bilateral pleural effusions. Electronically Signed   By: Constance Holster M.D.   On: 12/05/2018 03:24   Dg Hip Unilat  With Pelvis 2-3 Views Right  Result Date: 12/25/2018 CLINICAL DATA:  84 year old female with fall and right lower extremity pain. EXAM: RIGHT FEMUR 2 VIEWS; RIGHT TIBIA AND FIBULA - 2 VIEW; DG HIP (WITH OR WITHOUT PELVIS) 2-3V RIGHT COMPARISON:  None. FINDINGS: There is a comminuted and displaced intertrochanteric fracture of the right femur. There is no dislocation. The bones are osteopenic. There is osteoarthritic changes of the right knee. Calcified fibroids noted over the pelvis. There is mild diffuse subcutaneous edema and soft tissue contusion over the anterior shin. Vascular calcifications noted. IMPRESSION: Comminuted and displaced intertrochanteric fracture of the right femur. Electronically Signed   By: Anner Crete M.D.   On: 12/25/2018 23:11   Dg Femur Min 2 Views Right  Result Date: 12/25/2018 CLINICAL DATA:  84 year old female with fall and right lower extremity pain. EXAM: RIGHT FEMUR 2 VIEWS; RIGHT TIBIA AND FIBULA - 2 VIEW; DG HIP (WITH OR WITHOUT PELVIS) 2-3V RIGHT COMPARISON:  None. FINDINGS: There is a comminuted and displaced intertrochanteric fracture of the right femur. There is no dislocation. The bones are osteopenic. There is osteoarthritic changes of the right knee. Calcified fibroids noted over the pelvis. There is mild diffuse subcutaneous edema and soft tissue contusion over the anterior shin. Vascular calcifications noted. IMPRESSION: Comminuted and displaced intertrochanteric fracture of the right femur. Electronically Signed   By: Anner Crete M.D.   On: 12/25/2018 23:11      ASSESSMENT/PLAN     Acute on chronic hypoxemic respiratory failure -Due to chronic systolic CHF with EF 25 to 30% -Cardiology on case appreciate input-follow recommendations -Currently being diuresed by nephrology - Lasix, will add IV albumin to increase efficacy -Complicated by acute blood loss anemia,  CKD - will ask nephro to evaluate patient as she is higher risk for progressing to ESRD, family asked about dialysis options this can be discussed with renal specialist.      Bilateral atelectasis -Contributing to hypoxemia - chest PT - MetaNeb BID - Incentive spirometer to be used each hour multiple times - please  encourage to use  - patient has difficulty expectorating phlegm - will ask RT to bring Acapella device as well     Moderate protein calorie malnutrition and deconditioning -Contributing to dyspnea -Patient working with physical therapy was able to get out of bed and slowly walk today but encouraged to continue this as well as outpatient -Nutritional assessment patient has been aspirating and has been evaluated with modified barium swallow recommended aspiration precautions-s/p dietician evaluation - appreciate input     Pulmonary pre-operative evaluation  ARISCAT ([place]) preoperative pulmonary risk index in adults- High risk - 42.1% chance of any pulmonary complication postoperatively (ie atelectasis, increased O2 req etc)           -Mainly due to advanced age, baseline chronic hypoxemia, preoperative anemia, significant comorbid conditions  [place] respiratory failure index - moderate risk- 4.2-10.1% chance of post operative respiratory failure requiring MV >48h  GUPTA calculator for postoperative respiratory failure - moderate risk - 4.03% chance of postoperative respiratory failure requiring mechanical ventilation over 48 hours      Thank you for allowing me to participate in the care of this patient.   Patient/Family are satisfied with care plan and all questions have  been answered.  This document was prepared using Dragon voice recognition software and may include unintentional dictation errors.     Ottie Glazier, M.D.  Division of [facility]

## 2018-12-27 NOTE — Progress Notes (Signed)
Initial Nutrition Assessment  RD working remotely.  DOCUMENTATION CODES:   Not applicable  INTERVENTION:  Recommend liberalizing diet to 2 gram sodium. Provide meats cut and moistened per SLP recommendations from last admission.  Provide Ensure Enlive po BID, each supplement provides 350 kcal and 20 grams of protein.  Provide Ocuvite daily for wound healing (provides zinc, vitamin A, vitamin C, Vitamin E, copper, and selenium).  Provide vitamin C 250 mg BID.  NUTRITION DIAGNOSIS:   Increased nutrient needs related to wound healing as evidenced by estimated needs.  GOAL:   Patient will meet greater than or equal to 90% of their needs  MONITOR:   PO intake, Supplement acceptance, Labs, Weight trends, Skin, I & O's  REASON FOR ASSESSMENT:   Consult Assessment of nutrition requirement/status, Wound healing  ASSESSMENT:   83 year old female with PMHx of HTN, chronic combined systolic and diastolic heart failure (LV EF 25-30% 11/05/2018), GERD, hx mitral valve replacement with mechanical valve in 2016 on Coumadin, arthritis, chronic back pain, CKD IV, lymphedema, A-fib s/p pacemaker placement in 2018,  Left lower extremity wound/hematoma s/p debridement and ACell placement admitted with closed right hip fracture secondary to mechanical fall.   Spoke with patient over the phone very briefly. She only answered two questions and then ended the conversation and hung up the phone. She reported she does not have a good appetite and does not want anything to eat. She reports her appetite has been decreased for 1.5 weeks. Attempted to explain to pt that RD was not calling from kitchen and needed to obtain more history but she refused to speak to RD any longer. Patient known to our service from recent admissions. Per chat her wound has been healing with 60-70% epithelialization as of 6/23. Patient's family sometimes brings in Boost but patient would not answer whether they had brought any in yet.  Will order Ensure for patient in the interim. Noted per last SLP evaluation patients needs meat cut and moistened.  Weights fluctuating in chart between 61-67 kg, likely from fluid status. Patient did not provide weight history.  Medications reviewed.  Labs reviewed: Chloride 96, BUN 56, Creatinine 2.22.  NUTRITION - FOCUSED PHYSICAL EXAM:  Unable to complete at this time.  Diet Order:   Diet Order            Diet Heart Room service appropriate? Yes; Fluid consistency: Thin  Diet effective now             EDUCATION NEEDS:   No education needs have been identified at this time  Skin:  Skin Assessment: Skin Integrity Issues:(wound left leg; unstageable pressure injury left ankle)  Last BM:  Unknown  Height:   Ht Readings from Last 1 Encounters:  12/25/18 4\' 11"  (1.499 m)   Weight:   Wt Readings from Last 1 Encounters:  12/25/18 67.1 kg   Ideal Body Weight:  44.7 kg  BMI:  Body mass index is 29.89 kg/m.  Estimated Nutritional Needs:   Kcal:  1500-1700  Protein:  75-85 grams  Fluid:  per MD  Willey Blade, MS, RD, LDN Office: [phone] Pager: [phone] After Hours/Weekend Pager: [phone]

## 2018-12-27 NOTE — Progress Notes (Signed)
Progress Note   Subjective   Doing well today, the patient denies CP.  SOB is stable.  Plan appears to be to not proceed with surgery today.  No new concerns  Inpatient Medications    Scheduled Meds: . sodium chloride   Intravenous Once  . amiodarone  200 mg Oral Daily  . carvedilol  3.125 mg Oral BID WC  . cholecalciferol  2,000 Units Oral Daily  . feeding supplement (ENSURE ENLIVE)  237 mL Oral BID BM  . ipratropium-albuterol  3 mL Nebulization Q6H  . losartan  12.5 mg Oral Daily  . midodrine  5 mg Oral TID WC  . multivitamin-lutein  1 capsule Oral Daily  . polyethylene glycol  17 g Oral Daily  . vitamin C  250 mg Oral BID   Continuous Infusions: . furosemide (LASIX) infusion 4 mg/hr (12/27/18 1142)   PRN Meds: acetaminophen **OR** acetaminophen, ondansetron **OR** ondansetron (ZOFRAN) IV, traMADol   Vital Signs    Vitals:   12/26/18 1903 12/27/18 0022 12/27/18 0725 12/27/18 0813  BP: 96/71 (!) 114/48  (!) 112/49  Pulse: 62 62 66 61  Resp: 16 19 16 18   Temp: 98 F (36.7 C) 98 F (36.7 C)  97.6 F (36.4 C)  TempSrc: Oral Oral  Oral  SpO2: 97% 98% 99% 96%  Weight:      Height:        Intake/Output Summary (Last 24 hours) at 12/27/2018 1250 Last data filed at 12/27/2018 0000 Gross per 24 hour  Intake 890 ml  Output 650 ml  Net 240 ml   Filed Weights   12/25/18 2219  Weight: 67.1 kg      Physical Exam   GEN- The patient is elderly and frail appearing, alert and oriented x 3 today.   Head- normocephalic, atraumatic Eyes-  Sclera clear, conjunctiva pink Ears- hearing intact Oropharynx- clear Neck- supple, Lungs-  normal work of breathing Heart- irregular rate and rhythm  GI- soft, NT, ND, + BS Extremities- no clubbing, cyanosis, + dependant edema  MS-diffuse atrophy Skin- no rash or lesion Psych- euthymic mood, full affect Neuro- strength and sensation are intact   Labs    Chemistry Recent Labs  Lab 12/25/18 2229 12/26/18 0719 12/27/18  0740  NA 138 139 137  K 4.1 3.8 4.4  CL 96* 98 96*  CO2 31 32 32  GLUCOSE 139* 111* 111*  BUN 50* 53* 56*  CREATININE 1.65* 1.67* 2.22*  CALCIUM 9.4 9.0 8.9  PROT 6.8  --   --   ALBUMIN 3.9  --   --   AST 24  --   --   ALT 14  --   --   ALKPHOS 107  --   --   BILITOT 0.8  --   --   GFRNONAA 29* 28* 20*  GFRAA 33* 33* 23*  ANIONGAP 11 9 9      Hematology Recent Labs  Lab 12/25/18 2229 12/26/18 0719 12/26/18 2056 12/27/18 0740  WBC 6.3 8.4  --  12.6*  RBC 3.29* 2.41*  --  2.77*  HGB 8.0* 5.9* 7.8* 7.3*  HCT 27.2* 20.0* 25.0* 23.7*  MCV 82.7 83.0  --  85.6  MCH 24.3* 24.5*  --  26.4  MCHC 29.4* 29.5*  --  30.8  RDW 17.0* 16.9*  --  15.9*  PLT 182 175  --  182   Patient Profile     84 y.o. female with history of persistent atrial fibrillation status post  cardioversion (most recently Nov 05, 8717), chronic systolic congestive heart failure status post CRT, mechanical mitral valve replacement on chronic Coumadin, mild aortic stenosis,Stage IV chronic kidney disease, COPD on home O2and recent prolonged hospitalizations who was admitted with mechanical fall and hip fracture.   Assessment & Plan    1.  Acute on chronic systolic dysfunction/ nonischemic CM Coreg and losartan restarted Would keep Is and Os slightly negative as BP allows  2. Mechanical MV/ valvular heart disease As hemaglobin has improved and there is no plan for surgery today,  I would advise initiation of heparin drip today. Will need heparin bridge perioperatively.  Resume coumadin when cleared post operatively  3. Atrial fibrillation On amiodarone  4. HTN Hypotension has improved with PRBCs  5. Anemia, acute blood loss Resume heparin as above and follow closely  6. preop Proceed with surgery from CV standpoint.  Cardiology to follow  Thompson Grayer MD, [facility] 12/27/2018 12:50 PM

## 2018-12-27 NOTE — Progress Notes (Signed)
[place] [place] NAME: Charde Macfarlane    MR#:  [id number]  DATE OF BIRTH:  August 01, 1934  SUBJECTIVE:  CHIEF COMPLAINT:   Chief Complaint  Patient presents with  . Fall  . Hip Pain   No new complaint this morning.  Patient denies having any pain at this time.  No bleeding noted clinically.  No fevers.  Patient rested well last night.  REVIEW OF SYSTEMS:  Review of Systems  Constitutional: Negative for chills and fever.  HENT: Negative for hearing loss and tinnitus.   Eyes: Negative for blurred vision and double vision.  Respiratory: Negative for cough and shortness of breath.   Cardiovascular: Negative for chest pain and palpitations.  Gastrointestinal: Negative for abdominal pain, diarrhea, heartburn, nausea and vomiting.  Genitourinary: Negative for dysuria and urgency.  Musculoskeletal: Negative for myalgias.       Right hip discomfort secondary to hip fracture following fall prior to admission.  Skin: Negative for itching and rash.  Neurological: Negative for dizziness and headaches.  Psychiatric/Behavioral: Negative for depression and hallucinations.    DRUG ALLERGIES:   Allergies  Allergen Reactions  . Ace Inhibitors     Cough  . Doxycycline Nausea Only  . Hydrocodone Itching and Nausea Only  . Mercury     Other reaction(s): Unknown  . Silver Dermatitis    Severe itching  . Cephalexin Rash  . Clindamycin/Lincomycin Rash  . Nickel Rash  . Penicillins Rash    Has patient had a PCN reaction causing immediate rash, facial/tongue/throat swelling, SOB or lightheadedness with hypotension: YES Has patient had a PCN reaction causing severe rash involving mucus membranes or skin necrosis: NO Has patient had a PCN reaction that required hospitalization NO Has patient had a PCN reaction occurring within the last 10 years: NO If all of the above answers are "NO", then may proceed with Cephalosporin use.   VITALS:  Blood  pressure (!) 112/49, pulse 61, temperature 97.6 F (36.4 C), temperature source Oral, resp. rate 18, height 4\' 11"  (1.499 m), weight 67.1 kg, SpO2 96 %. PHYSICAL EXAMINATION:   Physical Exam  Constitutional: She is oriented to person, place, and time and well-developed, well-nourished, and in no distress.  HENT:  Head: Normocephalic and atraumatic.  Right Ear: External ear normal.  Eyes: Pupils are equal, round, and reactive to light. Conjunctivae are normal. Right eye exhibits no discharge.  Neck: Normal range of motion. Neck supple. No thyromegaly present.  Cardiovascular: Normal rate.  Irregularly irregular  Pulmonary/Chest: Effort normal and breath sounds normal. No respiratory distress.  Abdominal: Soft. Bowel sounds are normal. She exhibits no distension.  Musculoskeletal:        General: No edema.     Comments: Slight discomfort and ecchymosis around right hip fracture site  Neurological: She is alert and oriented to person, place, and time.  Skin: Skin is warm and dry. She is not diaphoretic. No erythema.  Psychiatric: Affect and judgment normal.   LABORATORY PANEL:  Female CBC Recent Labs  Lab 12/27/18 0740  WBC 12.6*  HGB 7.3*  HCT 23.7*  PLT 182   ------------------------------------------------------------------------------------------------------------------ Chemistries  Recent Labs  Lab 12/25/18 2229  12/27/18 0740  NA 138   < > 137  K 4.1   < > 4.4  CL 96*   < > 96*  CO2 31   < > 32  GLUCOSE 139*   < > 111*  BUN 50*   < >  56*  CREATININE 1.65*   < > 2.22*  CALCIUM 9.4   < > 8.9  MG  --   --  2.4  AST 24  --   --   ALT 14  --   --   ALKPHOS 107  --   --   BILITOT 0.8  --   --    < > = values in this interval not displayed.   RADIOLOGY:  US Renal  Result Date: 12/27/2018 CLINICAL DATA:  Acute renal failure EXAM: RENAL / URINARY TRACT ULTRASOUND COMPLETE COMPARISON:  None. FINDINGS: Right Kidney: Renal measurements: 9.6 x 4.5 x 4.4 cm = volume: 101  mL. Cortical thinning. Cortical echogenicity is within normal limits. No mass or hydronephrosis visualized. Left Kidney: Renal measurements: 9.4 x 5.6 x 4.7 cm = volume: 129 mL. Echogenicity within normal limits. No mass or hydronephrosis visualized. Bladder: Appears normal for degree of bladder distention. IMPRESSION: 1. No acute findings.  No hydronephrosis bilaterally. 2. Cortical echogenicity within normal limits bilaterally. At least mild cortical thinning on the RIGHT. Electronically Signed   By: Franki Cabot M.D.   On: 12/27/2018 11:50   ASSESSMENT AND PLAN:     1. Closed right hip fracture secondary to mechanical fall  Patient appears to have some swelling around her right hip. Patient seen by orthopedic service.  Plans being made for surgery to be done by Dr. Marry Guan after the weekend. Coagulopathy was reversed with vitamin K and INR down to 1.2. Patient seen by cardiologist.  Patient is chronically very high risk for any surgical procedures.  No further cardiovascular risk assessment will be beneficial however.  Patient transfused with 2 units of packed red blood cells yesterday and hemoglobin improved and stabilized 7.3. Okay to proceed with surgery by orthopedic service when planned.  2.  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  Seen by nephrologist and patient started on Lasix drip low dose due to blood pressure being borderline.  Midodrine was also added to help maintain blood pressure while ongoing diuresis.  3.  Paroxysmal atrial fibrillation and history of mechanical mitral valve; AV paced Rate controlled.  Resumed amiodarone.  Blood pressure being borderline, limiting resumption of some of patient's home medications. Anticoagulation on hold due to right hip fracture and anemia requiring packed red blood cell transfusions.  Patient given vitamin K already and INR down to 1.2. Given history of  mechanical mitral valve, I discussed with cardiologist this morning Dr.  Rayann Heman.. Since no plans for surgery until after the weekend, he recommended protecting patient with heparin drip starting from today since no evidence of bleeding and hemoglobin remained stable..  Heparin drip can be discontinued prior to surgery since its short acting.  4.  Chronic hypoxic respiratory failure Stable.  Patient on 3 L of home oxygen therapy.  5.  Acute blood loss anemia Noted drop in hemoglobin to 5.9.  Suspect underlying hematoma around the right hip joint fracture site.  Transfused with 2 units of packed red blood cells with improvement in hemoglobin to 7.3.  6.  Acute kidney injury superimposed on chronic kidney disease stage IV Likely related to ongoing diuresis. Nephrology already following and assisting with management.  7.  Left lower extremity chronic wounds. Being managed by surgical service.   DVT prophylaxis; heparin drip ordered due to history of mechanical mitral valve.  All the records are reviewed and case discussed with Care Management/Social Worker.  Management plans discussed with the patient, family and they are in agreement. I called and updated patient's daughter Ms. [name] on treatment plans as outlined above.  All questions were answered and he is in agreement with the plan of care.  CODE STATUS: Full Code  TOTAL TIME TAKING CARE OF THIS PATIENT: 38 minutes.   More than 50% of the time was spent in counseling/coordination of care: YES  POSSIBLE D/C IN 3 DAYS, DEPENDING ON CLINICAL CONDITION.   Gerarda Conklin M.D on 12/27/2018 at 12:13 PM  Between 7am to 6pm - Pager - [phone]  After 6pm go to [email or web address] - Proofreader  [facility]  Office  [phone]  CC: Primary care physician; Rusty Aus, MD  Note: This dictation was prepared with Dragon dictation along with smaller phrase technology. Any transcriptional errors that result  from this process are unintentional.

## 2018-12-27 NOTE — Progress Notes (Signed)
Metane[employer] refused

## 2018-12-28 ENCOUNTER — Inpatient Hospital Stay: Payer: Medicare Other

## 2018-12-28 ENCOUNTER — Ambulatory Visit (INDEPENDENT_AMBULATORY_CARE_PROVIDER_SITE_OTHER): Payer: Medicare Other | Admitting: Vascular Surgery

## 2018-12-28 DIAGNOSIS — I5042 Chronic combined systolic (congestive) and diastolic (congestive) heart failure: Secondary | ICD-10-CM

## 2018-12-28 LAB — CBC
HCT: 19.9 % — ABNORMAL LOW (ref 36.0–46.0)
Hemoglobin: 6.1 g/dL — ABNORMAL LOW (ref 12.0–15.0)
MCH: 26 pg (ref 26.0–34.0)
MCHC: 30.7 g/dL (ref 30.0–36.0)
MCV: 84.7 fL (ref 80.0–100.0)
Platelets: 170 10*3/uL (ref 150–400)
RBC: 2.35 MIL/uL — ABNORMAL LOW (ref 3.87–5.11)
RDW: 16.8 % — ABNORMAL HIGH (ref 11.5–15.5)
WBC: 11.1 10*3/uL — ABNORMAL HIGH (ref 4.0–10.5)
nRBC: 0.3 % — ABNORMAL HIGH (ref 0.0–0.2)

## 2018-12-28 LAB — PREPARE RBC (CROSSMATCH)

## 2018-12-28 LAB — BASIC METABOLIC PANEL
Anion gap: 9 (ref 5–15)
BUN: 66 mg/dL — ABNORMAL HIGH (ref 8–23)
CO2: 31 mmol/L (ref 22–32)
Calcium: 8.3 mg/dL — ABNORMAL LOW (ref 8.9–10.3)
Chloride: 93 mmol/L — ABNORMAL LOW (ref 98–111)
Creatinine, Ser: 2.51 mg/dL — ABNORMAL HIGH (ref 0.44–1.00)
GFR calc Af Amer: 20 mL/min — ABNORMAL LOW (ref >60)
GFR calc non Af Amer: 17 mL/min — ABNORMAL LOW (ref >60)
Glucose, Bld: 127 mg/dL — ABNORMAL HIGH (ref 70–99)
Potassium: 4.5 mmol/L (ref 3.5–5.1)
Sodium: 133 mmol/L — ABNORMAL LOW (ref 135–145)

## 2018-12-28 LAB — HEMOGLOBIN AND HEMATOCRIT, BLOOD
HCT: 23.7 % — ABNORMAL LOW (ref 36.0–46.0)
HCT: 21.6 % — ABNORMAL LOW (ref 36.0–46.0)
HCT: 23 % — ABNORMAL LOW (ref 36.0–46.0)
Hemoglobin: 7.5 g/dL — ABNORMAL LOW (ref 12.0–15.0)
Hemoglobin: 7 g/dL — ABNORMAL LOW (ref 12.0–15.0)
Hemoglobin: 7.2 g/dL — ABNORMAL LOW (ref 12.0–15.0)

## 2018-12-28 LAB — HEPARIN LEVEL (UNFRACTIONATED): Heparin Unfractionated: 0.18 IU/mL — ABNORMAL LOW (ref 0.30–0.70)

## 2018-12-28 LAB — MAGNESIUM: Magnesium: 2.5 mg/dL — ABNORMAL HIGH (ref 1.7–2.4)

## 2018-12-28 LAB — GLUCOSE, CAPILLARY: Glucose-Capillary: 105 mg/dL — ABNORMAL HIGH (ref 70–99)

## 2018-12-28 LAB — PHOSPHORUS: Phosphorus: 4.6 mg/dL (ref 2.5–4.6)

## 2018-12-28 MED ORDER — SODIUM CHLORIDE 0.9% IV SOLUTION
Freq: Once | INTRAVENOUS | Status: AC
  Administered 2018-12-28: 03:00:00 via INTRAVENOUS

## 2018-12-28 MED ORDER — IPRATROPIUM-ALBUTEROL 0.5-2.5 (3) MG/3ML IN SOLN
3.0000 mL | Freq: Three times a day (TID) | RESPIRATORY_TRACT | Status: DC
  Administered 2018-12-28 – 2019-01-03 (×13): 3 mL via RESPIRATORY_TRACT
  Filled 2018-12-28 (×18): qty 3

## 2018-12-28 MED ORDER — NOREPINEPHRINE 4 MG/250ML-% IV SOLN
0.0000 ug/min | INTRAVENOUS | Status: DC
  Administered 2018-12-28 – 2018-12-29 (×2): 4 ug/min via INTRAVENOUS
  Administered 2018-12-29: 3 ug/min via INTRAVENOUS
  Administered 2018-12-30: 6 ug/min via INTRAVENOUS
  Administered 2018-12-31: 4 ug/min via INTRAVENOUS
  Filled 2018-12-28 (×5): qty 250

## 2018-12-28 MED ORDER — ALBUTEROL SULFATE (2.5 MG/3ML) 0.083% IN NEBU: 2.5000 mg | INHALATION_SOLUTION | RESPIRATORY_TRACT | Status: DC | PRN

## 2018-12-28 NOTE — Progress Notes (Signed)
[facility], [place] 12/28/18  Subjective:  Patient quite confused at the moment. Creatinine currently 2.5. Daughter at bedside.   Objective:  Vital signs in last 24 hours:  Temp:  [97.6 F (36.4 C)-99.1 F (37.3 C)] 97.8 F (36.6 C) (06/29 1400) Pulse Rate:  [57-83] 59 (06/29 1500) Resp:  [14-22] 14 (06/29 1500) BP: (79-113)/(41-89) 97/45 (06/29 1500) SpO2:  [91 %-100 %] 100 % (06/29 1500) FiO2 (%):  [31.5 %-70 %] 70 % (06/29 1430)  Weight change:  Filed Weights   12/25/18 2219  Weight: 67.1 kg    Intake/Output:    Intake/Output Summary (Last 24 hours) at 12/28/2018 1548 Last data filed at 12/28/2018 1400 Gross per 24 hour  Intake 858.52 ml  Output 450 ml  Net 408.52 ml     Physical Exam: General:  Frail elderly lady, laying in the bed  HEENT  moist oral mucous membranes  Neck  supple no JVD,  Pulm/lungs  bibasilar rales  CVS/Heart  irregular, 3/6 SEM  Abdomen:   Soft, nontender  Extremities:  2-3+ pitting edema approximately over the thighs  Neurologic:  Awake but confused  Skin:  Right leg ecchymosis, resolving hematoma    Basic Metabolic Panel:  Recent Labs  Lab 12/23/18 1405 12/25/18 2229 12/26/18 0719 12/27/18 0740 12/28/18 0008  NA 136 138 139 137 133*  K 4.1 4.1 3.8 4.4 4.5  CL 94* 96* 98 96* 93*  CO2 31* 31 32 32 31  GLUCOSE 88 139* 111* 111* 127*  BUN 45* 50* 53* 56* 66*  CREATININE 1.76* 1.65* 1.67* 2.22* 2.51*  CALCIUM 9.4 9.4 9.0 8.9 8.3*  MG  --   --   --  2.4 2.5*  PHOS  --   --   --   --  4.6     CBC: Recent Labs  Lab 12/25/18 2229 12/26/18 0719 12/26/18 2056 12/27/18 0740 12/28/18 0008 12/28/18 0739 12/28/18 1419  WBC 6.3 8.4  --  12.6* 11.1*  --   --   NEUTROABS 4.7  --   --   --   --   --   --   HGB 8.0* 5.9* 7.8* 7.3* 6.1* 7.5* 7.0*  HCT 27.2* 20.0* 25.0* 23.7* 19.9* 23.7* 21.6*  MCV 82.7 83.0  --  85.6 84.7  --   --   PLT 182 175  --  182 170  --   --      No results found for:  HEPBSAG, HEPBSAB, HEPBIGM    Microbiology:  Recent Results (from the past 240 hour(s))  SARS Coronavirus 2 (CEPHEID- Performed in [facility] hospital lab), Hosp Order     Status: None   Collection Time: 12/26/18 12:33 AM   Specimen: Nasopharyngeal Swab  Result Value Ref Range Status   SARS Coronavirus 2 NEGATIVE NEGATIVE Final    Comment: (NOTE) If result is NEGATIVE SARS-CoV-2 target nucleic acids are NOT DETECTED. The SARS-CoV-2 RNA is generally detectable in upper and lower  respiratory specimens during the acute phase of infection. The lowest  concentration of SARS-CoV-2 viral copies this assay can detect is 250  copies / mL. A negative result does not preclude SARS-CoV-2 infection  and should not be used as the sole basis for treatment or other  patient management decisions.  A negative result may occur with  improper specimen collection / handling, submission of specimen other  than nasopharyngeal swab, presence of viral mutation(s) within the  areas targeted by this assay, and inadequate  number of viral copies  (<250 copies / mL). A negative result must be combined with clinical  observations, patient history, and epidemiological information. If result is POSITIVE SARS-CoV-2 target nucleic acids are DETECTED. The SARS-CoV-2 RNA is generally detectable in upper and lower  respiratory specimens dur ing the acute phase of infection.  Positive  results are indicative of active infection with SARS-CoV-2.  Clinical  correlation with patient history and other diagnostic information is  necessary to determine patient infection status.  Positive results do  not rule out bacterial infection or co-infection with other viruses. If result is PRESUMPTIVE POSTIVE SARS-CoV-2 nucleic acids MAY BE PRESENT.   A presumptive positive result was obtained on the submitted specimen  and confirmed on repeat testing.  While 2019 novel coronavirus  (SARS-CoV-2) nucleic acids may be present in the  submitted sample  additional confirmatory testing may be necessary for epidemiological  and / or clinical management purposes  to differentiate between  SARS-CoV-2 and other Sarbecovirus currently known to infect humans.  If clinically indicated additional testing with an alternate test  methodology [id number]) is advised. The SARS-CoV-2 RNA is generally  detectable in upper and lower respiratory sp ecimens during the acute  phase of infection. The expected result is Negative. Fact Sheet for Patients:  StrictlyIdeas.no Fact Sheet for Healthcare Providers: BankingDealers.co.za This test is not yet approved or cleared by the [place] FDA and has been authorized for detection and/or diagnosis of SARS-CoV-2 by FDA under an Emergency Use Authorization (EUA).  This EUA will remain in effect (meaning this test can be used) for the duration of the COVID-19 declaration under Section 564(b)(1) of the Act, 21 U.S.C. section 360bbb-3(b)(1), unless the authorization is terminated or revoked sooner. Performed at [facility], [address] [place], [place] [address]   MRSA PCR Screening     Status: None   Collection Time: 12/26/18  1:54 AM   Specimen: Nasal Mucosa; Nasopharyngeal  Result Value Ref Range Status   MRSA by PCR NEGATIVE NEGATIVE Final    Comment:        The GeneXpert MRSA Assay (FDA approved for NASAL specimens only), is one component of a comprehensive MRSA colonization surveillance program. It is not intended to diagnose MRSA infection nor to guide or monitor treatment for MRSA infections. Performed at [facility], [address] [place], [place] [address]     Coagulation Studies: Recent Labs    12/25/18 2230 12/26/18 0719 12/26/18 1534 12/27/18 0740  LABPROT 31.1* 28.8* 21.1* 15.5*  INR 3.1* 2.8* 1.9* 1.2    Urinalysis: Recent Labs    12/27/18 1816  COLORURINE YELLOW*  LABSPEC 1.016   PHURINE 5.0  GLUCOSEU NEGATIVE  HGBUR NEGATIVE  BILIRUBINUR NEGATIVE  KETONESUR NEGATIVE  PROTEINUR 30*  NITRITE NEGATIVE  LEUKOCYTESUR NEGATIVE      Imaging: US Renal  Result Date: 12/27/2018 CLINICAL DATA:  Acute renal failure EXAM: RENAL / URINARY TRACT ULTRASOUND COMPLETE COMPARISON:  None. FINDINGS: Right Kidney: Renal measurements: 9.6 x 4.5 x 4.4 cm = volume: 101 mL. Cortical thinning. Cortical echogenicity is within normal limits. No mass or hydronephrosis visualized. Left Kidney: Renal measurements: 9.4 x 5.6 x 4.7 cm = volume: 129 mL. Echogenicity within normal limits. No mass or hydronephrosis visualized. Bladder: Appears normal for degree of bladder distention. IMPRESSION: 1. No acute findings.  No hydronephrosis bilaterally. 2. Cortical echogenicity within normal limits bilaterally. At least mild cortical thinning on the RIGHT. Electronically Signed   By: Franki Cabot  M.D.   On: 12/27/2018 11:50   Dg Chest Port 1 View  Result Date: 12/28/2018 CLINICAL DATA:  Acute respiratory failure EXAM: PORTABLE CHEST 1 VIEW COMPARISON:  Three days ago FINDINGS: Layering pleural effusions and marked cardiomegaly. There is been mitral valve replacement and biventricular pacer implant. Vascular congestion/edema and streaky lung opacity suggesting atelectasis. IMPRESSION: CHF pattern including bilateral moderate pleural effusion. Electronically Signed   By: Monte Fantasia M.D.   On: 12/28/2018 05:56     Medications:   . furosemide (LASIX) infusion Stopped (12/28/18 0456)  . norepinephrine (LEVOPHED) Adult infusion 3 mcg/min (12/28/18 1400)   . sodium chloride   Intravenous Once  . amiodarone  200 mg Oral Daily  . cholecalciferol  2,000 Units Oral Daily  . feeding supplement (ENSURE ENLIVE)  237 mL Oral BID BM  . ipratropium-albuterol  3 mL Nebulization Q6H  . multivitamin-lutein  1 capsule Oral Daily  . polyethylene glycol  17 g Oral Daily  . vitamin C  250 mg Oral BID    acetaminophen **OR** acetaminophen, ondansetron **OR** ondansetron (ZOFRAN) IV, traMADol  Assessment/ Plan:  84 y.o.Caucasian  female with congestive heart failure EF 25%, chronic kidney disease, hypertension, atrial fibrillation, lymphedema, mitral valve replacement (mechanical valve) requiring chronic anticoagulation, COPD, osteoporosis, history of multiple lumbar compression fractures requiring kyphoplasty, history of left leg hematoma with evacuation and porcine allograft, was admitted on 12/25/2018 with right hip fracture.   1.  Acute kidney injury, chronic kidney disease stage III.  Baseline 1.37/GFR 36 from December 15, 2018 2.  Chronic systolic congestive heart failure with EF 25% 3.  Lower extremity edema with third spacing 4.  Pulmonary hypertension 5.  Mechanical heart valve requiring chronic anticoagulation  -Urine output was only 450 cc over the preceding 24 hours.  The patient is on low-dose Lasix drip at 4 mg IV per hour.  She is also maintained on norepinephrine to help maintain blood pressure.  Renal placement therapy could be considered but would not be easy given the patient's underlying heart failure and overall weak condition.  As before this would be used as a last resort.  Further plan as patient progresses.   LOS: 3 Keiko Myricks 6/29/20203:48 PM  [place], [place]  Note: This note was prepared with Dragon dictation. Any transcription errors are unintentional

## 2018-12-28 NOTE — Progress Notes (Signed)
Progress Note  Patient Name: [name] Date of Encounter: 12/28/2018  Primary Cardiologist: Rockey Situ  Subjective   Transferred to the ICU overnight with hypotension and HGB of 6.1 with subsequent discontinuation of heparin gtt (mechanical mitral valve) and initiation of pRBC transfusion. HGB this morning improved to 7.5. BUN/SCr 56/2.22-->66/2.51. Remains on HFNC. No chest pain. Somewhat agitated and confused this morning. Daughter at bedside.   ORIF of the right intertrochanteric femur fracture is anticipated for 12/30/2018.   Inpatient Medications    Scheduled Meds: . sodium chloride   Intravenous Once  . amiodarone  200 mg Oral Daily  . cholecalciferol  2,000 Units Oral Daily  . feeding supplement (ENSURE ENLIVE)  237 mL Oral BID BM  . ipratropium-albuterol  3 mL Nebulization Q6H  . losartan  12.5 mg Oral Daily  . multivitamin-lutein  1 capsule Oral Daily  . polyethylene glycol  17 g Oral Daily  . vitamin C  250 mg Oral BID   Continuous Infusions: . furosemide (LASIX) infusion Stopped (12/28/18 0456)  . norepinephrine (LEVOPHED) Adult infusion 4 mcg/min (12/28/18 0800)   PRN Meds: acetaminophen **OR** acetaminophen, ondansetron **OR** ondansetron (ZOFRAN) IV, traMADol   Vital Signs    Vitals:   12/28/18 0700 12/28/18 0800 12/28/18 0820 12/28/18 0900  BP: (!) 113/50 92/60  (!) 91/51  Pulse: (!) 59 61    Resp: 17 (!) 22 (!) 21 20  Temp:  97.6 F (36.4 C)    TempSrc:  Axillary    SpO2: 92% 98% 97%   Weight:      Height:        Intake/Output Summary (Last 24 hours) at 12/28/2018 0958 Last data filed at 12/28/2018 0954 Gross per 24 hour  Intake 789.94 ml  Output 450 ml  Net 339.94 ml   Filed Weights   12/25/18 2219  Weight: 67.1 kg    Telemetry    AV paced - Personally Reviewed  ECG    n/a - Personally Reviewed  Physical Exam   GEN: No acute distress.   Neck: JVD difficult to assess. Cardiac: RRR, III/VI systolic murmur throughout, no  rubs, or gallops.  Respiratory: Slightly diminished breath sounds bilaterally on HFNC. GI: Soft, nontender, non-distended.   MS: 1-2+ bilateral lower extremity edema with the left leg dressed with ACE wrap; No deformity. Neuro:  Alert and oriented x 3; Nonfocal.  Psych: Normal affect.  Labs    Chemistry Recent Labs  Lab 12/25/18 2229 12/26/18 0719 12/27/18 0740 12/28/18 0008  NA 138 139 137 133*  K 4.1 3.8 4.4 4.5  CL 96* 98 96* 93*  CO2 31 32 32 31  GLUCOSE 139* 111* 111* 127*  BUN 50* 53* 56* 66*  CREATININE 1.65* 1.67* 2.22* 2.51*  CALCIUM 9.4 9.0 8.9 8.3*  PROT 6.8  --   --   --   ALBUMIN 3.9  --   --   --   AST 24  --   --   --   ALT 14  --   --   --   ALKPHOS 107  --   --   --   BILITOT 0.8  --   --   --   GFRNONAA 29* 28* 20* 17*  GFRAA 33* 33* 23* 20*  ANIONGAP 11 9 9 9      Hematology Recent Labs  Lab 12/26/18 0719  12/27/18 0740 12/28/18 0008 12/28/18 0739  WBC 8.4  --  12.6* 11.1*  --   RBC  2.41*  --  2.77* 2.35*  --   HGB 5.9*   < > 7.3* 6.1* 7.5*  HCT 20.0*   < > 23.7* 19.9* 23.7*  MCV 83.0  --  85.6 84.7  --   MCH 24.5*  --  26.4 26.0  --   MCHC 29.5*  --  30.8 30.7  --   RDW 16.9*  --  15.9* 16.8*  --   PLT 175  --  182 170  --    < > = values in this interval not displayed.    Cardiac EnzymesNo results for input(s): TROPONINI in the last 168 hours. No results for input(s): TROPIPOC in the last 168 hours.   BNPNo results for input(s): BNP, PROBNP in the last 168 hours.   DDimer No results for input(s): DDIMER in the last 168 hours.   Radiology    US Renal  Result Date: 12/27/2018 IMPRESSION: 1. No acute findings.  No hydronephrosis bilaterally. 2. Cortical echogenicity within normal limits bilaterally. At least mild cortical thinning on the RIGHT. Electronically Signed   By: Franki Cabot M.D.   On: 12/27/2018 11:50   Dg Chest Port 1 View  Result Date: 12/28/2018 IMPRESSION: CHF pattern including bilateral moderate pleural effusion.  Electronically Signed   By: Monte Fantasia M.D.   On: 12/28/2018 05:56    Cardiac Studies   2D Echo 11/05/2018: 1. The left ventricle has severely reduced systolic function, with an ejection fraction of 25-30%. The cavity size was mildly dilated. Left ventricular diastolic Doppler parameters are consistent with impaired relaxation. Left ventrical global hypokinesis without regional wall motion abnormalities.  2. The right ventricle has mildly reduced systolic function. The cavity was moderately enlarged. There is no increase in right ventricular wall thickness. Right ventricular systolic pressure is moderately elevated with an estimated pressure of 53 mmHg.  3. Right atrial size was moderately dilated.  4. Left atrial size was moderately dilated.  5. The aortic valve was not well visualized. Moderate calcification of the aortic valve. Aortic valve regurgitation was not assessed by color flow Doppler. Unable to estimate adegree of aortic valve stenosis given severely reduced systolic function.  6. Mechanical mitral valve. Mean gradient 5 mm Hg.  7. Tricuspid valve regurgitation is moderate.  8. Left pleural effusion noted, 6.5 cm  9. Rhythm is atrial tachycardia, rate 105 bpm  Patient Profile     84 y.o. female with history of chronic combined CHF secondary to NICM s/p CRT-P, PAF, LBBB, mitral valve disease s/p mechanical mitral valve on Coumadin, recent spontaneous left calf hematoma requiring debridement, CKD stage III, anemia, HTN, and chronic lower extremity edema admitted with a mechanical fall leading to right-sided intertrochanteric femur fracture with admission complicated by worsening anemia.   Assessment & Plan    1. Acute on chronic combined secondary to NICM: -Undergoing gentle diuresis per nephrology on Lasix gtt -Coreg held secondary to hypovolemic hypotension requiring Levophed  -Would hold losartan as well -Resume beta blocker and ARB when able -Not currently on ARNI/MRA  secondary to CKD and hypotension   2. Mechanical mitral valve: -Heparin gtt stopped overnight as below -Will need to discuss with MD regarding timing of resumption given her mechanical mitral valve as well as timing of transition to Coumadin post-operatively   3. Acute blood loss anemia with hypovolemic hypotension: -Transferred to the ICU overnight with HGB of 6.1 -pRBC with up trending HGB to 7.5 this morning  -Heparin gtt stopped -Levophed per PCCM  4. Cardiac  preoperative risk assessment: -She is high risk for noncardiac surgery -No further cardiovascular risk assessment or intervention will reduce this risk at this time given her comorbid conditions  -Would recommend correction and stabilization of her anemia prior to proceeding with surgery   5. Acute on CKD stage III: -Likely ATN in the setting of hypovolemic hypotension -Nephrology on board -Hold losartan  6. PAF/atrial tachycardia: -AV paced rhythm on telemetry today -Heparin and Coumadin held as above -Beta blocker held as above    For questions or updates, please contact [place] Please consult [email or web address] for contact info under Cardiology/STEMI.    Signed, Christell Faith, PA-C [place] Pager: [phone] 12/28/2018, 9:58 AM

## 2018-12-28 NOTE — Progress Notes (Signed)
[facility] [place] NAME: [name]    MR#:  [id number]  DATE OF BIRTH:  December 27, 1934  SUBJECTIVE:  CHIEF COMPLAINT:   Chief Complaint  Patient presents with  . Fall  . Hip Pain   Patient reported to have become more confused overnight.  Had to be transferred to ICU due to dropping hemoglobin and hypoxia.  Patient was also hypotensive and started on Levophed drip.  REVIEW OF SYSTEMS:  Unobtainable.  Patient pleasantly confused.  DRUG ALLERGIES:   Allergies  Allergen Reactions  . Ace Inhibitors     Cough  . Doxycycline Nausea Only  . Hydrocodone Itching and Nausea Only  . Mercury     Other reaction(s): Unknown  . Silver Dermatitis    Severe itching  . Cephalexin Rash  . Clindamycin/Lincomycin Rash  . Nickel Rash  . Penicillins Rash    Has patient had a PCN reaction causing immediate rash, facial/tongue/throat swelling, SOB or lightheadedness with hypotension: YES Has patient had a PCN reaction causing severe rash involving mucus membranes or skin necrosis: NO Has patient had a PCN reaction that required hospitalization NO Has patient had a PCN reaction occurring within the last 10 years: NO If all of the above answers are "NO", then may proceed with Cephalosporin use.   VITALS:  Blood pressure 93/74, pulse 79, temperature 97.8 F (36.6 C), temperature source Axillary, resp. rate (!) 25, height 4\' 11"  (1.499 m), weight 67.1 kg, SpO2 97 %. PHYSICAL EXAMINATION:   Physical Exam  Constitutional: She is oriented to person, place, and time and well-developed, well-nourished, and in no distress.  HENT:  Head: Normocephalic and atraumatic.  Right Ear: External ear normal.  Eyes: Pupils are equal, round, and reactive to light. Conjunctivae are normal. Right eye exhibits no discharge.  Neck: Normal range of motion. Neck supple. No thyromegaly present.  Cardiovascular: Normal rate.  Irregularly irregular  Pulmonary/Chest:  Effort normal and breath sounds normal. No respiratory distress.  Abdominal: Soft. Bowel sounds are normal. She exhibits no distension.  Musculoskeletal:        General: No edema.     Comments: Slight discomfort and ecchymosis around right hip fracture site  Neurological: She is alert and oriented to person, place, and time.  Skin: Skin is warm and dry. She is not diaphoretic. No erythema.  Psychiatric: Affect and judgment normal.   LABORATORY PANEL:  Female CBC Recent Labs  Lab 12/28/18 0008  12/28/18 1419  WBC 11.1*  --   --   HGB 6.1*   < > 7.0*  HCT 19.9*   < > 21.6*  PLT 170  --   --    < > = values in this interval not displayed.   ------------------------------------------------------------------------------------------------------------------ Chemistries  Recent Labs  Lab 12/25/18 2229  12/28/18 0008  NA 138   < > 133*  K 4.1   < > 4.5  CL 96*   < > 93*  CO2 31   < > 31  GLUCOSE 139*   < > 127*  BUN 50*   < > 66*  CREATININE 1.65*   < > 2.51*  CALCIUM 9.4   < > 8.3*  MG  --    < > 2.5*  AST 24  --   --   ALT 14  --   --   ALKPHOS 107  --   --   BILITOT 0.8  --   --    < > =  values in this interval not displayed.   RADIOLOGY:  Dg Chest Port 1 View  Result Date: 12/28/2018 CLINICAL DATA:  Acute respiratory failure EXAM: PORTABLE CHEST 1 VIEW COMPARISON:  Three days ago FINDINGS: Layering pleural effusions and marked cardiomegaly. There is been mitral valve replacement and biventricular pacer implant. Vascular congestion/edema and streaky lung opacity suggesting atelectasis. IMPRESSION: CHF pattern including bilateral moderate pleural effusion. Electronically Signed   By: Monte Fantasia M.D.   On: 12/28/2018 05:56   ASSESSMENT AND PLAN:     1. Closed right hip fracture secondary to mechanical fall  Patient appears to have some swelling around her right hip. Patient seen by orthopedic service.  Plans being made for surgery to be done by Dr. Marry Guan after the  weekend. Coagulopathy was reversed with vitamin K and INR down to 1.2. Patient seen by cardiologist.  Patient is chronically very high risk for any surgical procedures.  No further cardiovascular risk assessment will be beneficial however.  Patient transfused with 2 units of packed red blood cells previously  and hemoglobin improved to 7.3.  Hemoglobin dropped further to 6.1 unit overnight and patient being transfused today. Patient became hypoxic and hypotensive and had to be transferred to ICU Surgery currently on hold until patient hemodynamically stable.  2.  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  Seen by nephrologist and patient started on Lasix drip low dose due to blood pressure being borderline.  Midodrine was also added to help maintain blood pressure while ongoing diuresis. Patient also required Levophed drip to maintain blood pressure.  3.  Paroxysmal atrial fibrillation and history of mechanical mitral valve; AV paced Rate controlled.  Resumed amiodarone.  Blood pressure being borderline, limiting resumption of some of patient's home medications. Heparin drip discontinued last night due to further drop in hemoglobin requiring more packed red blood cell transfusions. Will defer resumption of anticoagulation to cardiology service.  4.  Acute on chronic hypoxic respiratory failure Patient had to be transferred to ICU last night due to hypoxia requiring up to 6 L of oxygen via nasal cannula.  Patient uses 3 L of oxygen at home at baseline.  5.  Acute blood loss anemia Further drop in hemoglobin to 6.1.  Being transfused with packed red blood cells today.  6.  Acute kidney injury superimposed on chronic kidney disease stage III Nephrology already following and assisting with management. Patient currently on low-dose of Lasix drip at 4 mg/h.  Requiring Levophed drip to maintain blood pressure.  Renal  replacement therapy will be used as a last resort pending further decision regarding goal of care from family.  7.  Left lower extremity chronic wounds. Being managed by surgical service.   DVT prophylaxis; heparin drip discontinued due to anemia.  No SCDs due to lower extremity wounds I updated patient's daughter present at bedside on treatment plans as outlined above.  I discussed with critical care physician.  Plans to get palliative care involved to discuss and address goals of care with family.  CODE STATUS: Full Code  TOTAL TIME TAKING CARE OF THIS PATIENT: 37 minutes.   More than 50% of the time was spent in counseling/coordination of care: YES  POSSIBLE D/C IN 3 DAYS, DEPENDING ON CLINICAL CONDITION.   Keatyn Luck M.D on 12/28/2018 at 4:13 PM  Between 7am to 6pm - Pager - [phone]  After 6pm go to [email or web address] - [facility]  [facility]  Office  [phone]  CC: Primary care physician; Rusty Aus, MD  Note: This dictation was prepared with Dragon dictation along with smaller phrase technology. Any transcriptional errors that result from this process are unintentional.

## 2018-12-28 NOTE — Progress Notes (Addendum)
ANTICOAGULATION CONSULT NOTE - Initial Consult  Pharmacy Consult for Heparin Drip Indication: mechanical Mitral Valve/A.fib  Allergies  Allergen Reactions  . Ace Inhibitors     Cough  . Doxycycline Nausea Only  . Hydrocodone Itching and Nausea Only  . Mercury     Other reaction(s): Unknown  . Silver Dermatitis    Severe itching  . Cephalexin Rash  . Clindamycin/Lincomycin Rash  . Nickel Rash  . Penicillins Rash    Has patient had a PCN reaction causing immediate rash, facial/tongue/throat swelling, SOB or lightheadedness with hypotension: YES Has patient had a PCN reaction causing severe rash involving mucus membranes or skin necrosis: NO Has patient had a PCN reaction that required hospitalization NO Has patient had a PCN reaction occurring within the last 10 years: NO If all of the above answers are "NO", then may proceed with Cephalosporin use.    Patient Measurements: Height: 4\' 11"  (149.9 cm) Weight: 148 lb (67.1 kg) IBW/kg (Calculated) : 43.2 Heparin Dosing Weight: 57.9kg  Vital Signs: Temp: 97.8 F (36.6 C) (06/29 0015) Temp Source: Axillary (06/29 0015) BP: 107/89 (06/29 0015) Pulse Rate: 83 (06/29 0015)  Labs: Recent Labs    12/26/18 0719 12/26/18 1534 12/26/18 2056 12/27/18 0740 12/28/18 0008  HGB 5.9*  --  7.8* 7.3* 6.1*  HCT 20.0*  --  25.0* 23.7* 19.9*  PLT 175  --   --  182 170  LABPROT 28.8* 21.1*  --  15.5*  --   INR 2.8* 1.9*  --  1.2  --   HEPARINUNFRC  --   --   --   --  0.18*  CREATININE 1.67*  --   --  2.22* 2.51*    Estimated Creatinine Clearance: 14.7 mL/min (A) (by C-G formula based on SCr of 2.51 mg/dL (H)).   Medical History: Past Medical History:  Diagnosis Date  . Arthritis    "back, hands, knees" (04/10/2016)  . Chronic back pain   . 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 EF 25% by echo in 08/2015 b. RHC in 08/2015 showed normal filling pressures; c. 10/2018 Echo (in  setting of atrial tachycardia): EF 25-30%, impaired relaxation. Glob HK. RVSP 27mmHg. Mod dil RA/LA. Mech MV w/ nl fxn (mean grad 14mmHg). Mod TR.   Marland Kitchen Chronic kidney disease   . Compression fracture    "several; all in my back" (04/10/2016)  . GERD (gastroesophageal reflux disease)   . Hypertension   . Lymphedema   . Pacemaker 05/2017  . PAF (paroxysmal atrial fibrillation) ([place])    a. On amio & Coumadin - CHA2DS2VASc = 4.  . S/P MVR (mitral valve replacement)    a. MVR 1994 b. redo MVR in 08/2014 - on Coumadin  . Shortness of breath dyspnea     Medications:    Assessment: Closed right hip fracture secondary to mechanical fall  Coagulopathy was reversed with vitamin K and INR down to 1.2.-Patient on Coumadin prior to admission Paroxysmal atrial fibrillation and history of mechanical mitral valve; AV paced Rate controlled. Given history of mechanical mitral valve, Dr. Stark Jock discussed with cardiologist this morning Dr.  Rayann Heman.. Since no plans for surgery until after the weekend, he recommended protecting patient with heparin drip starting from today since no evidence of bleeding and hemoglobin remained stable..  Heparin drip can be discontinued prior to surgery since its short acting. No BOLUS  6/28 heparin infusion started at 850 units/hr  Goal of Therapy:  Heparin level 0.3-0.7 units/ml Monitor platelets  Plan:  0629 @ 0008 HL 0.18. level is subtherapeutic.   Hgb: 5.9>>  7.8>> 6.1- Nurse and MD aware. Patient will receive transfusion.   No bolus. Will  increase heparin infusion rate to 1000 units/hr.  Check anti-Xa level in 8 hours and daily while on heparin Continue to monitor H&H and platelets  Pernell Dupre, PharmD, BCPS Clinical Pharmacist 12/28/2018 1:40 AM

## 2018-12-28 NOTE — Progress Notes (Signed)
Report given to Josh, RN at this time and pt transferred via bed to room 17 in CCU.

## 2018-12-28 NOTE — Progress Notes (Addendum)
Spoke with pt's daughter [name] on the phone and informed her of previously noted events, including that pt is about to receive 1 unit of PRBC's. Daughter [name] expressed her thanks for informing her of new information, and requested staff please call her if anything else should occur. Daughter [name] also asked if it is possible to enter the building at this time of night, but did not specify if she was coming to hospital at this time.    While speaking to daughter [name], pt's prelim VS was noted to be low, 79/42. Daughter [name] informed that RN needed to contact the on call provider, Daughter [name] verbalized understanding.    Pt becoming restless again, attempting to knock O2 mask off of her own face, and becoming very agitated. This was communicated to on call provider, who stated he would come to floor and see her.  Requested order to transfer pt to higher level of care as her status was too acute for general floor, on call provider agreed and order received to transfer pt to CCU. Daughter [name] called again to notify her of pt's order to transfer to CCU, daughter [name] agreed and thanked RN for informing her of latest events. PRBC's initiated at 0340.

## 2018-12-28 NOTE — Progress Notes (Signed)
[facility] Progress Note Patient Name: [name] DOB: [birth year] MRN: [id number]   Date of Service  12/28/2018  HPI/Events of Note  New admit eval: Shifted to ICU for low Hg of 6.1/hypotension.   84 yr old female admitted for 1. Closed rt hip inter trochanteric # of femure from mechanical fall, admitted on 26th,  scheduled for ORIF on Wednesday. 2) blood loss Anemia, got transfused with improvement to 8 , . Chronic anemia hx. Now back down to 6.1. plt ok. . 3) P.  afib, failed cardioversion recently, , combined CHF.  On warf. Reversed and on heparin gtt/Mechanical Mitral valve. 4). AKI. Seen by Nephro/cards earlier yesterday. 5)PEM/leg wounds/edema.   Data reviewed.  Hg 6.1, Cr 2.5. CxR Chf/atelectasis.  UA neg.   Camera: Just got into room, HR 68. On simple mask o2. In pain when moving her legs from #.  86/57. MAP 68. sats 97%. Getting PRBC. sats 100%.     eICU Interventions  - getting transfused PRBC to keep Hg > 7, watch for worsening CHF.  - consider BiPAP if worsening wob. - asp precautions - pain control - watch for GI bleeding signs like melena or hematemesis.  - a fib rate ok on amiodarone. Watch for bradycardia. Discussed with charge RN.      Intervention Category Major Interventions: Arrhythmia - evaluation and management;Operative interventional procedure - evaluation Evaluation Type: New Patient Evaluation  Elmer Sow 12/28/2018, 4:29 AM

## 2018-12-28 NOTE — Progress Notes (Signed)
[name] received a referral to check on pt and family. Pt has transitioned to ICU from 2A. Pt family were appreciative for the support. [name] encouraged them to have staff page a chaplain if further support was needed. [name] allowed private time for the family to be with the pt that currently has a poor prognosis. Pt was not responsive at the time but presented to have a moderate affect. [name] spoke w/ pt's care team to determine the [place] for the pt. Pt desires to go home. Pt would benefit from a consultation from palliative care/hospice to get a better understanding of what is safe and beneficial to keep her comfortable as she transitions. [name] has been informed that the pt is NOT on cc yet.  F/u recommended.    12/28/18 1300  Clinical Encounter Type  Visited With Health care provider;Patient and family together  Visit Type Psychological support;Spiritual support;Social support;Critical Care  Referral From Chaplain  Consult/Referral To Chaplain  Recommendations consult to palliative care   Spiritual Encounters  Spiritual Needs Emotional;Grief support  Stress Factors  Patient Stress Factors Family relationships;Major life changes;Loss of control;Health changes  Family Stress Factors Family relationships;Major life changes

## 2018-12-28 NOTE — Telephone Encounter (Signed)
Patient currently admitted at this time since 12/25/18.

## 2018-12-28 NOTE — TOC Initial Note (Signed)
Transition of Care [facility]) - Initial/Assessment Note    Patient Details  Name: [name] MRN: [id number] Date of Birth: [birth year]  Transition of Care [facility]) CM/SW Contact:    Marshell Garfinkel, RN Phone Number: 12/28/2018, 9:39 AM  Clinical Narrative:                 Active with nursing, PT, and social work with [facility]; Corene Cornea aware. Chronic O2 and NIV at home. Dr. Marry Guan Orthopedic surgeon anticipates ORIF on Wednesday pending INR.   Expected Discharge Plan: [place] Barriers to Discharge: Continued Medical Work up   Patient Goals and CMS Choice     Choice offered to / list presented to : Patient  Expected Discharge Plan and Services Expected Discharge Plan: [place] In-house Referral: Clinical Social Work   Post Acute Care Choice: Home Health, Resumption of Svcs/PTA Provider Living arrangements for the past 2 months: Single Family Home                               Date [place]: 12/26/18 Time [place]: [phone] Representative spoke with at [place]: Corene Cornea  Prior Living Arrangements/Services Living arrangements for the past 2 months: [place] with:: Adult Children Patient language and need for interpreter reviewed:: Yes Do you feel safe going back to the place where you live?: Yes      Need for Family Participation in Patient Care: No (Comment)   Current home services: DME, Home PT, Home RN Criminal Activity/Legal Involvement Pertinent to Current Situation/Hospitalization: No - Comment as needed  Activities of Daily Living Home Assistive Devices/Equipment: Eyeglasses, Oxygen, Walker (specify type) ADL Screening (condition at time of admission) Patient's cognitive ability adequate to safely complete daily activities?: Yes Is the patient deaf or have difficulty hearing?: No Does the patient have difficulty seeing, even when wearing glasses/contacts?: Yes Does the patient  have difficulty concentrating, remembering, or making decisions?: No Patient able to express need for assistance with ADLs?: Yes Does the patient have difficulty dressing or bathing?: Yes Independently performs ADLs?: Yes (appropriate for developmental age) Does the patient have difficulty walking or climbing stairs?: Yes Weakness of Legs: Both Weakness of Arms/Hands: Both  Permission Sought/Granted Permission sought to share information with : Case Manager                Emotional Assessment Appearance:: Appears stated age Attitude/Demeanor/Rapport: Ambitious, Engaged Affect (typically observed): Pleasant, Quiet Orientation: : Oriented to Self, Oriented to Place, Oriented to  Time      Admission diagnosis:  Fall [W19.XXXA] Fall, initial encounter [id number]] Closed fracture of right hip, initial encounter [facility]) [S72.001A] Patient Active Problem List   Diagnosis Date Noted  . GERD (gastroesophageal reflux disease) 12/25/2018  . Closed right hip fracture ([place]) 12/25/2018  . Hip fracture ([place]) 12/25/2018  . Goals of care, counseling/discussion   . Palliative care by specialist   . Wound of left leg   . Persistent atrial fibrillation   . Atrial tachycardia ([place])   . Chronic kidney disease (CKD), stage IV (severe) ([place])   . Chest pain of uncertain etiology   . Traumatic hematoma of left lower leg 11/09/2018  . Acute exacerbation of CHF (congestive heart failure) ([place]) 11/04/2018  . Pulmonary hypertension, unspecified ([place]) 11/04/2018  . S/P MVR (mitral valve replacement) 11/04/2018  . Encounter for therapeutic drug monitoring 09/09/2017  . Encounter  for anticoagulation discussion and counseling 09/09/2017  . PAF (paroxysmal atrial fibrillation) ([place]) 02/25/2017  . Chronic venous insufficiency 10/27/2016  . Shortness of breath 08/14/2016  . Pressure injury of skin 04/12/2016  . Malnutrition of moderate degree 04/11/2016  . Vertebral compression fracture ([place]) 04/10/2016   . Intractable pain 04/10/2016  . Spinal compression fracture ([place]) 04/10/2016  . Acute on chronic HFrEF (heart failure with reduced ejection fraction) ([place]) 11/19/2015  . Lymphedema   . Bilateral lower extremity edema 08/30/2015  . Acute on chronic renal failure ([place]) 04/28/2015  . Hyponatremia 04/28/2015  . History of mitral valve replacement with mechanical valve 11/04/2014  . Long term current use of anticoagulant therapy 09/28/2014  . Chronic combined systolic and diastolic CHF  47/03/6282  . 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 Generalized OA 11/28/2013  . HLD (hyperlipidemia) 11/28/2013   PCP:  Rusty Aus, MD Pharmacy:   [place], [place] - [address] [place] [address] Phone: [phone] Fax: [phone]     Social Determinants of Health (SDOH) Interventions    Readmission Risk Interventions Readmission Risk Prevention Plan 12/26/2018 12/07/2018 11/30/2018  Transportation Screening Complete Complete Complete  PCP or Specialist Appt within 3-5 Days - Complete -  HRI or Home Care Consult Complete Complete -  Social Work Consult for [place] Planning/Counseling Complete Not Complete -  SW consult not completed comments - NA -  Palliative Care Screening - Not Applicable -  Medication Review Press photographer) Complete Complete Complete  PCP or Specialist appointment within 3-5 days of discharge - - Complete  HRI or [place] - - Complete  SW Recovery Care/Counseling Consult - - Not Complete  SW Consult Not Complete Comments - - na  Palliative Care Screening - - Not [place] - - Complete  Some recent data might be hidden

## 2018-12-28 NOTE — Consult Note (Signed)
Name: [name] MRN: [id number] DOB: 17-Sep-1934    ADMISSION DATE:  12/25/2018 CONSULTATION DATE:  12/28/2018  REFERRING MD :  Dr. Marcille Blanco  CHIEF COMPLAINT:  Hypotension  BRIEF PATIENT DESCRIPTION:  84 y.o. Female admitted to [place] unit due to Right hip intertrochanteric fracture s/p fall.  Her course has been complicated by Acute on Chronic Systolic CHF (EF 16-38%), AKI on CKD III, and acute blood loss anemia.  Orthopedics, General Surgery, Cardiology, and Nephrology are following.  On 6/29 she developed hypotension and noted drop in hemoglobin to 6.1 requiring transfer to [facility] unit and blood transfusion.  SIGNIFICANT EVENTS  6/26>> Admission to Med/Surg unit 6/29>> Transfer to Stepdown due to Hypotension and acute blood loss anemia  STUDIES:  DG 2 View Right Femur 6/26>> Comminuted and displaced intertrochanteric fracture of the right femur. CXR 6/26>> 1. Continued cardiomegaly with vascular congestion and interstitial pulmonary edema. 2. Grossly stable small moderate left greater than right pleural effusions with airspace consolidation at both bases. No change in right perihilar wedge-shaped area of consolidation. Renal Ultrasound 6/28>> 1. No acute findings.  No hydronephrosis bilaterally. 2. Cortical echogenicity within normal limits bilaterally. At least mild cortical thinning on the RIGHT.  CULTURES: MRSA PCR 6/27>> Negative SARS-CoV-2 PCR 6/27>> Negative  ANTIBIOTICS: N/A  HISTORY OF PRESENT ILLNESS:   [name] is a 84 year old female with a past medical history notable for chronic combined systolic and diastolic CHF (EF 25 to 45%), paroxysmal atrial fibrillation, mitral valve replacement on chronic Coumadin therapy, chronic kidney disease stage III, COPD on 3-4 L home O2, who presented to [facility] ED on 12/25/2018 with right hip pain status post fall.  She was found to have intertrochanteric right hip fracture, of which she was admitted to the [place] unit  with Orthopedic consultation.  Her hospital course has been complicated by acute on chronic combined systolic and diastolic CHF, AKI on CKD stage III, and acute blood loss anemia.  Cardiology and nephrology have been following along as well.  On 6/28 she was transitioned from IV Lasix 40 mg bolus BID to low-dose Lasix infusion by nephrology, along with the addition of Midodrine due to marginal BP.  Early in the morning of 6/29 she was noted to be hypotensive (BP 79/42) and hemoglobin found to be 6.1.  She is being transferred to stepdown unit for further evaluation and treatment of hypovolemic shock in the setting of acute blood loss anemia requiring blood transfusion.  PCCM is consulted for further management.  PAST MEDICAL HISTORY :   has a past medical history of Arthritis, Chronic back pain, Chronic combined systolic (congestive) and diastolic (congestive) heart failure ([place]), Chronic kidney disease, Compression fracture, GERD (gastroesophageal reflux disease), Hypertension, Lymphedema, Pacemaker (05/2017), PAF (paroxysmal atrial fibrillation) ([place]), S/P MVR (mitral valve replacement), and Shortness of breath dyspnea.  has a past surgical history that includes Cardiac catheterization; pyloric stenosis (07/1937); Cataract extraction w/PHACO (Right, 02/20/2015); Irrigation and debridement hematoma (Left, 07/05/2015); US ECHOCARDIOGRAPHY; Cardiac catheterization (N/A, 10/09/2015); Breast lumpectomy (Right, 2005?); Cardiac valve replacement (1994, 2016); ir generic historical (04/01/2016); ir generic historical (04/15/2016); ir generic historical (04/15/2016); ir generic historical (04/15/2016); ir generic historical (05/20/2016); ir generic historical (06/13/2016); ir generic historical (06/13/2016); ir generic historical (06/13/2016); Cardioversion (N/A, 11/06/2018); Irrigation and debridement hematoma (Left, 11/11/2018); Incision and drainage of wound (Left, 11/13/2018); Dressing change under anesthesia (Left,  11/17/2018); and Incision and drainage of wound (Left, 11/19/2018). Prior to Admission medications   Medication Sig Start Date End Date Taking? Authorizing  Provider  acetaminophen (TYLENOL) 650 MG CR tablet Take 1 tablet (650 mg total) by mouth every 6 (six) hours as needed for pain (mild and moderate pain). 11/30/18  Yes Gladstone Lighter, MD  albuterol (PROVENTIL) (2.5 MG/3ML) 0.083% nebulizer solution Take 3 mLs (2.5 mg total) by nebulization every 6 (six) hours as needed for wheezing or shortness of breath. 11/30/18  Yes Gladstone Lighter, MD  amiodarone (PACERONE) 200 MG tablet Take 1 tablet (200 mg total) by mouth daily. 09/24/18  Yes Minna Merritts, MD  carvedilol (COREG) 3.125 MG tablet Take 1 tablet (3.125 mg total) by mouth 2 (two) times daily with a meal. 12/15/18  Yes Gouru, Aruna, MD  Cholecalciferol (VITAMIN D) 2000 units tablet Take 2,000 Units by mouth daily.   Yes [provider]  furosemide (LASIX) 40 MG tablet Take 1 tablet (40 mg total) by mouth 2 (two) times daily. 11/30/18  Yes Gladstone Lighter, MD  hydrOXYzine (ATARAX/VISTARIL) 25 MG tablet Take 1 tablet (25 mg total) by mouth 3 (three) times daily as needed for itching or anxiety. 12/15/18  Yes Gouru, Aruna, MD  ipratropium-albuterol (DUONEB) 0.5-2.5 (3) MG/3ML SOLN Take 3 mLs by nebulization every 6 (six) hours as needed for up to 90 doses. 12/15/18  Yes Gouru, Illene Silver, MD  losartan (COZAAR) 25 MG tablet Take 0.5 tablets (12.5 mg total) by mouth daily. 12/16/18  Yes Gouru, Aruna, MD  Menthol (ICY HOT) 5 % PTCH Apply 1 patch topically daily as needed (pain).   Yes [provider]  metolazone (ZAROXOLYN) 2.5 MG tablet Take 1 tablet (2.5 mg total) once daily on Monday Wednesday and Friday. 12/23/18  Yes Theora Gianotti, NP  polyethylene glycol (MIRALAX / GLYCOLAX) 17 g packet Take 17 g by mouth daily. 11/30/18  Yes Gladstone Lighter, MD  potassium chloride SA (K-DUR) 20 MEQ tablet Take 1 tablet (20 mEq total) by  mouth daily. Patient taking differently: Take 10 mEq by mouth daily.  11/30/18  Yes Gladstone Lighter, MD  spironolactone (ALDACTONE) 25 MG tablet Take 25 mg by mouth daily.   Yes [provider]  traMADol (ULTRAM) 50 MG tablet Take 1 tablet (50 mg total) by mouth every 6 (six) hours as needed for moderate pain. 12/15/18  Yes Gouru, Illene Silver, MD  vitamin C (ASCORBIC ACID) 500 MG tablet Take 500 mg by mouth daily.   Yes [provider]  warfarin (COUMADIN) 6 MG tablet Take 1 tablet (6 mg total) by mouth daily. Patient taking differently: Take 3-6 mg by mouth See admin instructions. Take  tablet (3mg ) by mouth every Monday, Wednesday and Friday evening and take 1 tablet (6mg ) by mouth every Sunday, Tuesday, Thursday and Saturday evening 12/23/18  Yes Theora Gianotti, NP  zinc sulfate 220 (50 Zn) MG capsule Take 1 capsule (220 mg total) by mouth daily. 12/01/18  Yes Gladstone Lighter, MD  multivitamin-lutein [facility]) CAPS capsule Take 1 capsule by mouth daily. Patient not taking: Reported on 12/27/2018 12/16/18   Nicholes Mango, MD   Allergies  Allergen Reactions   Ace Inhibitors     Cough   Doxycycline Nausea Only   Hydrocodone Itching and Nausea Only   [place]     Other reaction(s): Unknown   Silver Dermatitis    Severe itching   Cephalexin Rash   Clindamycin/Lincomycin Rash   Nickel Rash   Penicillins Rash    Has patient had a PCN reaction causing immediate rash, facial/tongue/throat swelling, SOB or lightheadedness with hypotension: YES Has patient had a  PCN reaction causing severe rash involving mucus membranes or skin necrosis: NO Has patient had a PCN reaction that required hospitalization NO Has patient had a PCN reaction occurring within the last 10 years: NO If all of the above answers are "NO", then may proceed with Cephalosporin use.    FAMILY HISTORY:  family history includes Renal Disease in her father; Stroke in her mother. SOCIAL  HISTORY:  reports that she quit smoking about 38 years ago. Her smoking use included cigarettes. She has a 27.00 pack-year smoking history. She has never used smokeless tobacco. She reports previous alcohol use. She reports that she does not use drugs.   COVID-19 DISASTER DECLARATION:  FULL CONTACT PHYSICAL EXAMINATION WAS NOT POSSIBLE DUE TO TREATMENT OF COVID-19 AND  CONSERVATION OF PERSONAL PROTECTIVE EQUIPMENT, LIMITED EXAM FINDINGS INCLUDE-  Patient assessed or the symptoms described in the history of present illness.  In the context of the Global COVID-19 pandemic, which necessitated consideration that the patient might be at risk for infection with the SARS-CoV-2 virus that causes COVID-19, Institutional protocols and algorithms that pertain to the evaluation of patients at risk for COVID-19 are in a state of rapid change based on information released by regulatory bodies including the CDC and federal and state organizations. These policies and algorithms were followed during the patient's care while in hospital.  REVIEW OF SYSTEMS:  Positives in BOLD Constitutional: Negative for fever, chills, weight loss, malaise/fatigue and diaphoresis.  HENT: Negative for hearing loss, ear pain, nosebleeds, congestion, sore throat, neck pain, tinnitus and ear discharge.   Eyes: Negative for blurred vision, double vision, photophobia, pain, discharge and redness.  Respiratory: Negative for cough, hemoptysis, sputum production, shortness of breath, wheezing and stridor.   Cardiovascular: Negative for chest pain, palpitations, orthopnea, claudication, +leg swelling and PND.  Gastrointestinal: Negative for heartburn, nausea, vomiting, abdominal pain, diarrhea, constipation, blood in stool and melena.  Genitourinary: Negative for dysuria, urgency, frequency, hematuria and flank pain.  Musculoskeletal: Negative for myalgias, back pain, +joint pain and falls.  Skin: Negative for itching and rash.    Neurological: Negative for dizziness, tingling, tremors, sensory change, speech change, focal weakness, seizures, loss of consciousness, weakness and headaches.  Endo/Heme/Allergies: Negative for environmental allergies and polydipsia. Does not bruise/bleed easily.  SUBJECTIVE:  Pt reports generalized pain and mild shortness of breath On 50% Venturi Mask  VITAL SIGNS: Temp:  [97.6 F (36.4 C)-99.1 F (37.3 C)] 98.6 F (37 C) (06/29 0359) Pulse Rate:  [61-83] 68 (06/29 0359) Resp:  [15-20] 15 (06/29 0359) BP: (79-112)/(41-89) 91/49 (06/29 0359) SpO2:  [93 %-99 %] 97 % (06/29 0359)  PHYSICAL EXAMINATION: General: Acute on chronically ill-appearing female, laying in bed, on 50% Venturi mask, with generalized pain, no acute distress Neuro: Awake, oriented to person and place, refuses to follow commands due to generalized pain HEENT: Atraumatic, normocephalic, neck supple, no JVD Cardiovascular: Paced rhythm, Lungs: Diffuse coarse rhonchi upon auscultation, even, nonlabored Abdomen: Soft, nontender, nondistended, no guarding or rebound tenderness, bowel sounds positive x4 Musculoskeletal:  RLE is externally rotated Skin: Ecchymosis and hematoma noted around right hip fracture site, Small superficial abrasion to right lateral lower leg region with serosanguineous drainage, LLE chronic wound wrapped with Kerlix & ACE wrap dry and intact.  Recent Labs  Lab 12/26/18 0719 12/27/18 0740 12/28/18 0008  NA 139 137 133*  K 3.8 4.4 4.5  CL 98 96* 93*  CO2 32 32 31  BUN 53* 56* 66*  CREATININE 1.67* 2.22* 2.51*  GLUCOSE  111* 111* 127*   Recent Labs  Lab 12/26/18 0719 12/26/18 2056 12/27/18 0740 12/28/18 0008  HGB 5.9* 7.8* 7.3* 6.1*  HCT 20.0* 25.0* 23.7* 19.9*  WBC 8.4  --  12.6* 11.1*  PLT 175  --  182 170   US Renal  Result Date: 12/27/2018 CLINICAL DATA:  Acute renal failure EXAM: RENAL / URINARY TRACT ULTRASOUND COMPLETE COMPARISON:  None. FINDINGS: Right Kidney: Renal  measurements: 9.6 x 4.5 x 4.4 cm = volume: 101 mL. Cortical thinning. Cortical echogenicity is within normal limits. No mass or hydronephrosis visualized. Left Kidney: Renal measurements: 9.4 x 5.6 x 4.7 cm = volume: 129 mL. Echogenicity within normal limits. No mass or hydronephrosis visualized. Bladder: Appears normal for degree of bladder distention. IMPRESSION: 1. No acute findings.  No hydronephrosis bilaterally. 2. Cortical echogenicity within normal limits bilaterally. At least mild cortical thinning on the RIGHT. Electronically Signed   By: Franki Cabot M.D.   On: 12/27/2018 11:50    ASSESSMENT / PLAN:  Acute Hypoxic Respiratory Failure in setting of Acute on Chronic Systolic CHF & Bilateral Atelectasis Hx: COPD on home O2 3-4L -Supplemental O2 as needed to maintain O2 sats 88 to 94% -BiPAP if needed for worsening SOB -Follow intermittent CXR & ABG as needed -Lasix drip per Nephrology -Continue Chest PT & MetaNebs BID -Incentive spirometry  Hypovolemic shock in setting of Acute Blood Loss Anemia +/- Cardiogenic shock Acute on Chronic Systolic CHF (EF 67-67%) Hx: A-fib, Mechanical Mitral Valve Replacement on chronic Coumadin -Cardiac monitoring -Maintain MAP >65 -Transfuse as indicated -Levophed if needed to maintain MAP goal -Cardiology following, appreciate input -Continue Midodrine -Continue low dose Lasix drip as per Nephrology -Hold Coreg & Losartan for now -Continue PO Amiodarone  AKI on CKD III -Monitor I&O's / urinary output -Follow BMP -Ensure adequate renal perfusion -Avoid nephrotoxic agents as able -Replace electrolytes as indicated -Nephrology following, appreciate input  Acute Blood Loss Anemia -Monitor for S/Sx of bleeding -Trend CBC -Follow H&H q6h -Discontinue Heparin drip for now, SCD's for VTE Prophylaxis  -Transfuse for Hgb <8 -Will give 1 unit pRBC's and follow up H/H -Notified Dr. Roland Rack with Orthopaedics of pt transfer due to Hypovolemic shock  due to Acute blood loss  Right Intertrochanteric Femur Fracture -Orthopaedics following, appreciate input, will follow recommendations -ORIF as per Ortho (tentative plan for Wednesday) -Pain control        DISPOSITION: Stepdown GOALS OF CARE: Full Code VTE PROPHYLAXIS: SCD's (D/c Heparin drip due to Acute blood loss anemia and hypotension) UPDATES: Updated patient at bedside 6/29 and updated pt's daughter Delorse Lek via telephone. Notified Dr. Zack Seal with Orthopaedics of pt transfer via [place] in [place].  Darel Hong, AGACNP-BC [facility] Pager: [phone] Cell: [phone]  12/28/2018, 4:04 AM

## 2018-12-28 NOTE — Progress Notes (Signed)
Pt noted to be increasingly restless, pulling [place] off and pushing it to her forehead. O2 saturation observed to decline quite rapidly when pt did this (dropped to 50's-60's within seconds). Pt becomes tachypneic and mentation noted to be altered after multiple episodes of O2 removal by pt.  Safety mitts placed on pt's hands to prevent further hypoxia, and O2 increased from 4L to 6L at this time. Pt also noted to have only 50 ml UOP at 2345 despite having a lasix gtt currently infusing; pt bladder scanned and noted to only have 54ml within bladder. Pt had currently voided 27ml out at that time.   On call provider notified of above findings, pt noted to have some improvement with O2 saturation at this time, as well as clearer mentation. No new orders presently.

## 2018-12-28 NOTE — Progress Notes (Signed)
[place] labs for pt reviewed, several concerning values noted, including elevated creat/BUN, and hgb of 6.1 (down from 7.3). On call provider again notified. At the same time, pharmacy called to notify RN that heparin level was subtherapeutic, asked if MD wished to increase heparin gtt rate since hgb results were so low. This was communicated to on call provider, and after review of chart & speaking w/RN, orders to increase heparin rate received, as well as order to transfuse 1 unit PRBC's.

## 2018-12-29 ENCOUNTER — Encounter: Payer: Self-pay | Admitting: Anesthesiology

## 2018-12-29 DIAGNOSIS — I5023 Acute on chronic systolic (congestive) heart failure: Secondary | ICD-10-CM

## 2018-12-29 DIAGNOSIS — W19XXXA Unspecified fall, initial encounter: Secondary | ICD-10-CM

## 2018-12-29 DIAGNOSIS — Z515 Encounter for palliative care: Secondary | ICD-10-CM

## 2018-12-29 DIAGNOSIS — J811 Chronic pulmonary edema: Secondary | ICD-10-CM

## 2018-12-29 DIAGNOSIS — Z7189 Other specified counseling: Secondary | ICD-10-CM

## 2018-12-29 DIAGNOSIS — J9690 Respiratory failure, unspecified, unspecified whether with hypoxia or hypercapnia: Secondary | ICD-10-CM

## 2018-12-29 LAB — TYPE AND SCREEN
ABO/RH(D): A POS
Antibody Screen: NEGATIVE
Unit division: 0
Unit division: 0
Unit division: 0

## 2018-12-29 LAB — BPAM RBC
Blood Product Expiration Date: 202007042359
Blood Product Expiration Date: 202007132359
Blood Product Expiration Date: 202007132359
ISSUE DATE / TIME: 202006271255
ISSUE DATE / TIME: 202006271615
ISSUE DATE / TIME: 202006290324
Unit Type and Rh: 600
Unit Type and Rh: 6200
Unit Type and Rh: 6200

## 2018-12-29 LAB — BASIC METABOLIC PANEL
Anion gap: 12 (ref 5–15)
BUN: 73 mg/dL — ABNORMAL HIGH (ref 8–23)
CO2: 31 mmol/L (ref 22–32)
Calcium: 8.9 mg/dL (ref 8.9–10.3)
Chloride: 92 mmol/L — ABNORMAL LOW (ref 98–111)
Creatinine, Ser: 2.17 mg/dL — ABNORMAL HIGH (ref 0.44–1.00)
GFR calc Af Amer: 24 mL/min — ABNORMAL LOW (ref >60)
GFR calc non Af Amer: 21 mL/min — ABNORMAL LOW (ref >60)
Glucose, Bld: 106 mg/dL — ABNORMAL HIGH (ref 70–99)
Potassium: 4.2 mmol/L (ref 3.5–5.1)
Sodium: 135 mmol/L (ref 135–145)

## 2018-12-29 LAB — RENAL FUNCTION PANEL
Albumin: 3.2 g/dL — ABNORMAL LOW (ref 3.5–5.0)
Albumin: 3.2 g/dL — ABNORMAL LOW (ref 3.5–5.0)
Anion gap: 12 (ref 5–15)
Anion gap: 10 (ref 5–15)
BUN: 71 mg/dL — ABNORMAL HIGH (ref 8–23)
BUN: 65 mg/dL — ABNORMAL HIGH (ref 8–23)
CO2: 31 mmol/L (ref 22–32)
CO2: 32 mmol/L (ref 22–32)
Calcium: 8.5 mg/dL — ABNORMAL LOW (ref 8.9–10.3)
Calcium: 8.6 mg/dL — ABNORMAL LOW (ref 8.9–10.3)
Chloride: 90 mmol/L — ABNORMAL LOW (ref 98–111)
Chloride: 95 mmol/L — ABNORMAL LOW (ref 98–111)
Creatinine, Ser: 2.14 mg/dL — ABNORMAL HIGH (ref 0.44–1.00)
Creatinine, Ser: 1.82 mg/dL — ABNORMAL HIGH (ref 0.44–1.00)
GFR calc Af Amer: 24 mL/min — ABNORMAL LOW (ref >60)
GFR calc Af Amer: 30 mL/min — ABNORMAL LOW (ref >60)
GFR calc non Af Amer: 21 mL/min — ABNORMAL LOW (ref >60)
GFR calc non Af Amer: 26 mL/min — ABNORMAL LOW (ref >60)
Glucose, Bld: 121 mg/dL — ABNORMAL HIGH (ref 70–99)
Glucose, Bld: 128 mg/dL — ABNORMAL HIGH (ref 70–99)
Phosphorus: 3.6 mg/dL (ref 2.5–4.6)
Phosphorus: 3 mg/dL (ref 2.5–4.6)
Potassium: 3.9 mmol/L (ref 3.5–5.1)
Potassium: 3.9 mmol/L (ref 3.5–5.1)
Sodium: 133 mmol/L — ABNORMAL LOW (ref 135–145)
Sodium: 137 mmol/L (ref 135–145)

## 2018-12-29 LAB — APTT: aPTT: 57 seconds — ABNORMAL HIGH (ref 24–36)

## 2018-12-29 LAB — MAGNESIUM
Magnesium: 2.5 mg/dL — ABNORMAL HIGH (ref 1.7–2.4)
Magnesium: 2.4 mg/dL (ref 1.7–2.4)
Magnesium: 2.4 mg/dL (ref 1.7–2.4)

## 2018-12-29 LAB — HEMOGLOBIN AND HEMATOCRIT, BLOOD
HCT: 22.7 % — ABNORMAL LOW (ref 36.0–46.0)
HCT: 21.4 % — ABNORMAL LOW (ref 36.0–46.0)
Hemoglobin: 7.2 g/dL — ABNORMAL LOW (ref 12.0–15.0)
Hemoglobin: 6.8 g/dL — ABNORMAL LOW (ref 12.0–15.0)

## 2018-12-29 LAB — PROTIME-INR
INR: 1.2 (ref 0.8–1.2)
Prothrombin Time: 15.1 seconds (ref 11.4–15.2)

## 2018-12-29 LAB — PREPARE RBC (CROSSMATCH)

## 2018-12-29 MED ORDER — HEPARIN SODIUM (PORCINE) 1000 UNIT/ML DIALYSIS
1000.0000 [IU] | INTRAMUSCULAR | Status: DC | PRN
  Administered 2019-01-01 – 2019-01-02 (×4): 1400 [IU] via INTRAVENOUS_CENTRAL
  Filled 2018-12-29: qty 6
  Filled 2018-12-29: qty 1
  Filled 2018-12-29: qty 6
  Filled 2018-12-29: qty 4
  Filled 2018-12-29: qty 2
  Filled 2018-12-29: qty 6
  Filled 2018-12-29: qty 4
  Filled 2018-12-29 (×3): qty 6

## 2018-12-29 MED ORDER — SODIUM CHLORIDE 0.9% IV SOLUTION: Freq: Once | INTRAVENOUS | Status: DC

## 2018-12-29 MED ORDER — HEPARIN (PORCINE) 25000 UT/250ML-% IV SOLN
1350.0000 [IU]/h | INTRAVENOUS | Status: DC
  Administered 2018-12-29: 850 [IU]/h via INTRAVENOUS
  Administered 2018-12-30: 1100 [IU]/h via INTRAVENOUS
  Administered 2018-12-31 – 2019-01-01 (×2): 1250 [IU]/h via INTRAVENOUS
  Administered 2019-01-02: 1350 [IU]/h via INTRAVENOUS
  Administered 2019-01-03 – 2019-01-04 (×3): 1450 [IU]/h via INTRAVENOUS
  Administered 2019-01-05: 1350 [IU]/h via INTRAVENOUS
  Filled 2018-12-29 (×9): qty 250

## 2018-12-29 MED ORDER — SODIUM CHLORIDE 0.9% IV SOLUTION
Freq: Once | INTRAVENOUS | Status: AC
  Administered 2018-12-29: 21:00:00 via INTRAVENOUS

## 2018-12-29 MED ORDER — PUREFLOW DIALYSIS SOLUTION
INTRAVENOUS | Status: DC
  Administered 2018-12-29 – 2018-12-30 (×3): via INTRAVENOUS_CENTRAL
  Administered 2018-12-30: 3 via INTRAVENOUS_CENTRAL
  Administered 2018-12-30 – 2018-12-31 (×2): via INTRAVENOUS_CENTRAL
  Administered 2018-12-31: 3 via INTRAVENOUS_CENTRAL
  Administered 2018-12-31 – 2019-01-01 (×2): via INTRAVENOUS_CENTRAL
  Administered 2019-01-01: 3 via INTRAVENOUS_CENTRAL
  Administered 2019-01-02: 05:00:00 via INTRAVENOUS_CENTRAL

## 2018-12-29 MED ORDER — MIDODRINE HCL 5 MG PO TABS
5.0000 mg | ORAL_TABLET | Freq: Three times a day (TID) | ORAL | Status: DC
  Administered 2018-12-29 – 2019-01-04 (×11): 5 mg via ORAL
  Filled 2018-12-29 (×20): qty 1

## 2018-12-29 NOTE — Progress Notes (Signed)
Pastoral Care visit   12/29/18 1100  Clinical Encounter Type  Visited With Patient  Visit Type Psychological support;Spiritual support;Critical Care  Referral From Physician  Consult/Referral To Chaplain  Recommendations consult to palliative, follow up w/ daughter abt goals of care  Spiritual Encounters  Spiritual Needs Emotional;Prayer  Stress Factors  Patient Stress Factors Health changes   Pt was reclining in bed. Melven Sartorius identified self and invited pt to share how she is doing. Pt verbalized, "I just want to go home to die."  Chap listened to pt and inquired if pt has shared her wishes with MD and/or family members. Pt denies having talked abt this w/ MD or her daughter.  Pt shared about all she has been through and then later stated that she wants to "have the surgery to see if that will work."  [place] talked w/ pt about contradictory remarks of wanting to go home to die and wanting surgery. After further discussion about goals of care, pt indicated that if she can have surgery she wants to try and if not then she wishes to go home. Chap discussed nature of Palliative.  Pt was unsure what that meant but was open to talking w/ someone.  Pt asked for chap to pray...prayed w/ pt.  This chap recommends palliative consult.  Will follow up verbally w/ bedside RN.  Darcey Nora, Chaplain

## 2018-12-29 NOTE — Progress Notes (Signed)
Progress Note  Patient Name: [name] Date of Encounter: 12/29/2018  Primary Cardiologist: Ida Rogue, MD   Subjective   Patient frustrated that needs assistance with breathing this AM.  No other complaints at this time. No complaint of chest pain at this time.  Inpatient Medications    Scheduled Meds: . sodium chloride   Intravenous Once  . amiodarone  200 mg Oral Daily  . cholecalciferol  2,000 Units Oral Daily  . feeding supplement (ENSURE ENLIVE)  237 mL Oral BID BM  . ipratropium-albuterol  3 mL Nebulization TID  . multivitamin-lutein  1 capsule Oral Daily  . polyethylene glycol  17 g Oral Daily  . vitamin C  250 mg Oral BID   Continuous Infusions: . furosemide (LASIX) infusion 4 mg/hr (12/29/18 0600)  . norepinephrine (LEVOPHED) Adult infusion Stopped (12/29/18 0615)   PRN Meds: acetaminophen **OR** acetaminophen, albuterol, ondansetron **OR** ondansetron (ZOFRAN) IV, traMADol   Vital Signs    Vitals:   12/29/18 0400 12/29/18 0500 12/29/18 0600 12/29/18 0700  BP: (!) 126/55 (!) 100/54 103/78 (!) 110/35  Pulse:   62 63  Resp: 17 20 19  (!) 27  Temp:  98.2 F (36.8 C)  98.1 F (36.7 C)  TempSrc:  Oral  Oral  SpO2:   100% 100%  Weight:      Height:        Intake/Output Summary (Last 24 hours) at 12/29/2018 0818 Last data filed at 12/29/2018 0100 Gross per 24 hour  Intake 466.55 ml  Output 250 ml  Net 216.55 ml   Filed Weights   12/25/18 2219  Weight: 67.1 kg    Telemetry    AV paced, 60s - Personally Reviewed  ECG    No new tracings - Personally Reviewed  Physical Exam   GEN: No acute distress.  Frail elderly female, lying in bed.  No family at bedside at this time. Neck: Elevated JVP ~11cm. Cardiac: RRR, 2/6 systolic murmur best appreciated in the left sternal border. No rubs, or gallops.  Respiratory: Bibasilar diminished breath sounds. GI: Soft, nontender, non-distended  MS: 1-2+ bilateral posterior femur edema with R>L  and left lower leg wrapped with ACE bandage; No deformity. Neuro:  Nonfocal  Psych: Normal affect   Labs    Chemistry Recent Labs  Lab 12/25/18 2229 12/26/18 0719 12/27/18 0740 12/28/18 0008  NA 138 139 137 133*  K 4.1 3.8 4.4 4.5  CL 96* 98 96* 93*  CO2 31 32 32 31  GLUCOSE 139* 111* 111* 127*  BUN 50* 53* 56* 66*  CREATININE 1.65* 1.67* 2.22* 2.51*  CALCIUM 9.4 9.0 8.9 8.3*  PROT 6.8  --   --   --   ALBUMIN 3.9  --   --   --   AST 24  --   --   --   ALT 14  --   --   --   ALKPHOS 107  --   --   --   BILITOT 0.8  --   --   --   GFRNONAA 29* 28* 20* 17*  GFRAA 33* 33* 23* 20*  ANIONGAP 11 9 9 9      Hematology Recent Labs  Lab 12/26/18 0719  12/27/18 0740 12/28/18 0008 12/28/18 0739 12/28/18 1419 12/28/18 1830  WBC 8.4  --  12.6* 11.1*  --   --   --   RBC 2.41*  --  2.77* 2.35*  --   --   --  HGB 5.9*   < > 7.3* 6.1* 7.5* 7.0* 7.2*  HCT 20.0*   < > 23.7* 19.9* 23.7* 21.6* 23.0*  MCV 83.0  --  85.6 84.7  --   --   --   MCH 24.5*  --  26.4 26.0  --   --   --   MCHC 29.5*  --  30.8 30.7  --   --   --   RDW 16.9*  --  15.9* 16.8*  --   --   --   PLT 175  --  182 170  --   --   --    < > = values in this interval not displayed.    Cardiac EnzymesNo results for input(s): TROPONINI in the last 168 hours. No results for input(s): TROPIPOC in the last 168 hours.   BNPNo results for input(s): BNP, PROBNP in the last 168 hours.   DDimer No results for input(s): DDIMER in the last 168 hours.   Radiology    US Renal  Result Date: 12/27/2018 CLINICAL DATA:  Acute renal failure EXAM: RENAL / URINARY TRACT ULTRASOUND COMPLETE COMPARISON:  None. FINDINGS: Right Kidney: Renal measurements: 9.6 x 4.5 x 4.4 cm = volume: 101 mL. Cortical thinning. Cortical echogenicity is within normal limits. No mass or hydronephrosis visualized. Left Kidney: Renal measurements: 9.4 x 5.6 x 4.7 cm = volume: 129 mL. Echogenicity within normal limits. No mass or hydronephrosis visualized.  Bladder: Appears normal for degree of bladder distention. IMPRESSION: 1. No acute findings.  No hydronephrosis bilaterally. 2. Cortical echogenicity within normal limits bilaterally. At least mild cortical thinning on the RIGHT. Electronically Signed   By: Franki Cabot M.D.   On: 12/27/2018 11:50   Dg Chest Port 1 View  Result Date: 12/28/2018 CLINICAL DATA:  Acute respiratory failure EXAM: PORTABLE CHEST 1 VIEW COMPARISON:  Three days ago FINDINGS: Layering pleural effusions and marked cardiomegaly. There is been mitral valve replacement and biventricular pacer implant. Vascular congestion/edema and streaky lung opacity suggesting atelectasis. IMPRESSION: CHF pattern including bilateral moderate pleural effusion. Electronically Signed   By: Monte Fantasia M.D.   On: 12/28/2018 05:56    Cardiac Studies   TTE 11/05/2018  1. The left ventricle has severely reduced systolic function, with an ejection fraction of 25-30%. The cavity size was mildly dilated. Left ventricular diastolic Doppler parameters are consistent with impaired relaxation. Left ventrical global  hypokinesis without regional wall motion abnormalities.  2. The right ventricle has mildly reduced systolic function. The cavity was moderately enlarged. There is no increase in right ventricular wall thickness. Right ventricular systolic pressure is moderately elevated with an estimated pressure of 53 mmHg.  3. Right atrial size was moderately dilated.  4. Left atrial size was moderately dilated.  5. The aortic valve was not well visualized. Moderate calcification of the aortic valve. Aortic valve regurgitation was not assessed by color flow Doppler. Unable to estimate adegree of aortic valve stenosis given severely reduced systolic function.  6. Mechanical mitral valve. Mean gradient 5 mm Hg.  7. Tricuspid valve regurgitation is moderate.  8. Left pleural effusion noted, 6.5 cm  9. Rhythm is atrial tachycardia, rate 105 bpm   Patient  Profile     84 y.o. female with a history of chronic combined CHF secondary to an ICM s/p CRT-P, PAF, LBBB, mitral valve disease s/p mechanical mitral valve (previously on Coumadin), recent spontaneous left calf hematoma requiring debridement, CKD stage III, anemia, hypertension, and chronic lower extremity edema  admitted with mechanical fall leading to right-sided intertrochanteric femur fracture with admission complicated by worsening anemia.  Assessment & Plan    Acute on chronic combined systolic and diastolic CHF/NICM  - Remains SOB with R sided posterior femur / leg edema greater than that of L and up to the level of the hip in the setting of R hip fracture as below.  - Continue to hold losartan and BB. Not on ARNI/MRA 2/2 CKD/hypotension. Medical therapy has been limited by hypotension and blood loss anemia with worsening renal function.   - Continue gentle diuresis with lasix drip and close monitoring of renal function. Monitor I/Os, daily standing weights. +743.1cc for admission. Wt [phone].  Blood loss anemia - Received 2 units on 6/27 and 1 unit PRBC 6/29. Hgb 7.2 and Hct 23.0. Daily CBC. Continue to hold anticoagulation with recommendation to resume when able or post-operatively given thrombotic risk with mechanical mitral valve / PAF versus blood loss anemia as below.  Valvular heart disease s/p mechanical mitral valve - Coumadin held as of admission (6/27). Unable to restart IV heparin at this time given blood loss anemia. As previously noted, she is at significant risk for mechanical mitral valve thrombosis with recommendation to restart heparin bridge post-operatively and once able.   Paroxysmal atrial fibrillation - Continue amiodarone. AV paced. BB held d/t hypotension. Anticoagulation on hold as above d/t blood loss anemia as previously noted.   HTN - Hypotension limiting cardiac therapy this admission. Remains on norepinephrine drip. Continue to monitor vitals.  Acute on  CKDIII - Worsening renal function, complicating therapy. Likely ATN in setting of hypovolemic hypotension. Holding losartan. Renally dose medications.  Avoid nephrotoxins. Replete electrolytes as needed. Followed by nephrology.  ORIF of R intertrochanteric femur  - Patient ruled very high cardiac risk for any surgical procedures given her significant comorbid conditions and with no further CV risk assessment deemed beneficial with recommendation to balance risk versus benefits of surgery. Correction of anemia recommended prior to surgery. Per review of EMR, as of 6/29, surgery was anticipated 12/30/2018. Resume heparin bridge to warfarin post-operatively once able given mechanical valve, PAF and thrombotic risk.   Palliative Care - Palliative care consultation deemed appropriate at this time and given the need to discuss long term goals of care with family present. Patient has indicated her preference that she no longer receive further intervention. At this time, patient's goals of care differ from that of family with family planning to arrive later this AM.   For questions or updates, please contact [facility] Please consult [email or web address] for contact info under        Signed, Arvil Chaco, PA-C  12/29/2018, 8:18 AM

## 2018-12-29 NOTE — Progress Notes (Signed)
Pt currently alert and oriented she is refusing all lab work and attempting to remove O2.  She states "Do not touch me" and is currently agitated when staff attempts to provide care.  She would like her son or daughter contacted.  Pt caregiver currently at bedside.  Attempted to call pts daughter Delorse Lek, however she did not answer the phone and her voicemail is full.  Therefore, called pts son Elese Rane he did not answer the phone left voicemail message to return my call. Will continue to monitor and assess pt.  Labs rescheduled to be drawn at 10:00 am if pt agreeable.   Marda Stalker, [place] Pager [phone] (please enter 7 digits) PCCM Consult Pager [phone] (please enter 7 digits)

## 2018-12-29 NOTE — Consult Note (Signed)
ORTHOPAEDICS: Patient seen with daughter at bedside. The patient is awake and alert.  She denies any significant hip pain at the present time.  Consultant notes were reviewed.  Palliative care apparently had a discussion with the patient and her children.  No definitive plan was outlined by the consultant.  Anesthesiology also left a note voicing their concerns regarding the extremely high risk for complications from anesthesia including death or prolonged ventilation.  It was the anesthesiologist believe that the risks of any anesthetic outweighed the benefits.  However, the patient's son was adamant about surgery.   The patient and her daughter have informed me that the decision was made to see how the patient responded to dialysis before making any definitive decisions.  The patient just received a triple-lumen catheter with anticipated dialysis.  I will reassess the patient tomorrow.  She would need to be hemodynamically stable off of any vasopressors before surgery would be even a consideration.  James P. Holley Bouche M.D.

## 2018-12-29 NOTE — Progress Notes (Signed)
ANTICOAGULATION CONSULT NOTE - Initial Consult  Pharmacy Consult for Heparin Drip Indication: mechanical Mitral Valve/A.fib  Allergies  Allergen Reactions  . Ace Inhibitors     Cough  . Doxycycline Nausea Only  . Hydrocodone Itching and Nausea Only  . Mercury     Other reaction(s): Unknown  . Silver Dermatitis    Severe itching  . Cephalexin Rash  . Clindamycin/Lincomycin Rash  . Nickel Rash  . Penicillins Rash    Has patient had a PCN reaction causing immediate rash, facial/tongue/throat swelling, SOB or lightheadedness with hypotension: YES Has patient had a PCN reaction causing severe rash involving mucus membranes or skin necrosis: NO Has patient had a PCN reaction that required hospitalization NO Has patient had a PCN reaction occurring within the last 10 years: NO If all of the above answers are "NO", then may proceed with Cephalosporin use.    Patient Measurements: Height: 4\' 11"  (149.9 cm) Weight: 148 lb (67.1 kg) IBW/kg (Calculated) : 43.2 Heparin Dosing Weight: 57.9kg  Vital Signs: Temp: 98.1 F (36.7 C) (06/30 0700) Temp Source: Oral (06/30 0700) BP: 106/65 (06/30 1700) Pulse Rate: 67 (06/30 1700)  Labs: Recent Labs    12/27/18 0740 12/28/18 0008  12/28/18 1419 12/28/18 1830 12/29/18 1025  HGB 7.3* 6.1*   < > 7.0* 7.2* 7.2*  HCT 23.7* 19.9*   < > 21.6* 23.0* 22.7*  PLT 182 170  --   --   --   --   LABPROT 15.5*  --   --   --   --   --   INR 1.2  --   --   --   --   --   HEPARINUNFRC  --  0.18*  --   --   --   --   CREATININE 2.22* 2.51*  --   --   --  2.17*   < > = values in this interval not displayed.    Estimated Creatinine Clearance: 16.9 mL/min (A) (by C-G formula based on SCr of 2.17 mg/dL (H)).   Medical History: Past Medical History:  Diagnosis Date  . Arthritis    "back, hands, knees" (04/10/2016)  . Chronic back pain   . 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 EF 25% by echo in  08/2015 b. RHC in 08/2015 showed normal filling pressures; c. 10/2018 Echo (in setting of atrial tachycardia): EF 25-30%, impaired relaxation. Glob HK. RVSP 59mmHg. Mod dil RA/LA. Mech MV w/ nl fxn (mean grad 102mmHg). Mod TR.   Marland Kitchen Chronic kidney disease   . Compression fracture    "several; all in my back" (04/10/2016)  . GERD (gastroesophageal reflux disease)   . Hypertension   . Lymphedema   . Pacemaker 05/2017  . PAF (paroxysmal atrial fibrillation) ([place])    a. On amio & Coumadin - CHA2DS2VASc = 4.  . S/P MVR (mitral valve replacement)    a. MVR 1994 b. redo MVR in 08/2014 - on Coumadin  . Shortness of breath dyspnea     Medications:    Assessment: Closed right hip fracture secondary to mechanical fall  Coagulopathy was reversed with vitamin K and INR down to 1.2 on 6/28.-Patient on Coumadin prior to admission Paroxysmal atrial fibrillation and history of mechanical mitral valve; AV paced Rate controlled. Given history of mechanical mitral valve, patient was previously started on Heparin on 6/28 but was stopped on 6/29 due to significant drop in hemoglobin(7.8>6.1). Patient has since received  PRBC transfusion and Hgb is currently 7.2. Per Dr. Saunders Revel, it is safe to restart heparin drip and has placed the consult for Pharmacy to re-initiate   Goal of Therapy:  Heparin level 0.3-0.7 units/ml Monitor platelets  Plan: No bolus given recent acute blood loss anemia. Will start heparin infusion at rate of 850 units/hr.  Check anti-Xa level in 8 hours and daily while on heparin Continue to monitor H&H and platelets  Pearla Dubonnet, PharmD Clinical Pharmacist 12/29/2018 6:42 PM

## 2018-12-29 NOTE — Anesthesia Preprocedure Evaluation (Deleted)
Anesthesia Evaluation    Airway        Dental   Pulmonary former smoker,           Cardiovascular hypertension,      Neuro/Psych    GI/Hepatic   Endo/Other    Renal/GU      Musculoskeletal   Abdominal   Peds  Hematology   Anesthesia Other Findings   Reproductive/Obstetrics                             Anesthesia Physical Anesthesia Plan  ASA:   Anesthesia Plan:    Post-op Pain Management:    Induction:   PONV Risk Score and Plan:   Airway Management Planned:   Additional Equipment:   Intra-op Plan:   Post-operative Plan:   Informed Consent:   Plan Discussed with:   Anesthesia Plan Comments: (I had a long discussion with the patient, her son and her daughter.  I explained that with the patients current multi organ failure that the patient is extremely high risk for complications from anesthesia including death or prolonged ventilation.  That with the patients comorbidities, we feel that the risks of any anesthetic outweigh the benefits at this time. Patients son seemed adamant that the patient have surgery.  If this is the case then the patient may receive more appropriate care at a tertiary care hospital such as [facility] where the patient has received care in the past. They voiced understanding. Patients son asked if homeopathic or non traditional medicine would help and I responded that I am not trained in those disciplines.)       Anesthesia Quick Evaluation

## 2018-12-29 NOTE — Progress Notes (Signed)
Pts son Manika Hast returned my phone call I informed his mother Mrs. Cimino has refused labs and becomes extremely agitated when nursing staff or lab techs attempt to provide care.  She has been alert and oriented.Therefore, her labs are rescheduled for 1000 am.  Mr. Walters stated he would arrive at bedside this morning and discuss plan of care with his mother.  He stated Mrs. Sagar had a similar episode on 12/28/2018 and once he arrived at bedside pt agreeable to labs.  Marda Stalker, [place] Pager [phone] (please enter 7 digits) PCCM Consult Pager [phone] (please enter 7 digits)

## 2018-12-29 NOTE — Progress Notes (Signed)
Patient refusing to turn Q2 in bed, states she needs to have a bowel movement but is refusing to use a bed pan. Patient states she will "go in the bed." When cleaning patient patient did not want to change sheets or chucks from underneath her bottom, even though they are solid.

## 2018-12-29 NOTE — Procedures (Signed)
Central Venous Catheter Placement:TRIPLE LUMEN Indication: Patient receiving vesicant or irritant drug.; Patient receiving intravenous therapy for longer than 5 days.; Patient has limited or no vascular access.   Consent:emergent    Hand washing performed prior to starting the procedure.   Procedure:   An active timeout was performed and correct patient, name, & ID confirmed.   Patient was positioned correctly for central venous access.  Patient was prepped using strict sterile technique including chlorohexadine preps, sterile drape, sterile gown and sterile gloves.    The area was prepped, draped and anesthetized in the usual sterile manner. Patient comfort was obtained.    A triple lumen catheter was placed in L Fem vein under ultrasound guidance. There was good blood return, catheter caps were placed on lumens, catheter flushed easily, the line was secured and a sterile dressing and BIO-PATCH applied.   Ultrasound was used to visualize vasculature and guidance of needle.   Number of Attempts: 1 Complications:none Estimated Blood Loss: expected 5-10cc      Ottie Glazier, M.D.  Pulmonary & [place]

## 2018-12-29 NOTE — Progress Notes (Signed)
Patient states she is not in any pain and repeatedly states she wants to go home.

## 2018-12-29 NOTE — Progress Notes (Signed)
[place] [facility] [place] NAME: [name]    MR#:  [id number]  DATE OF BIRTH:  Apr 07, 1935  SUBJECTIVE:  CHIEF COMPLAINT:   Chief Complaint  Patient presents with  . Fall  . Hip Pain   Patient appears less confused this morning.  Patient's son and daughter present at bedside.  Updated on treatment plans.  Patient still requiring Levophed drip  REVIEW OF SYSTEMS:  Unobtainable.  Patient pleasantly confused.  DRUG ALLERGIES:   Allergies  Allergen Reactions  . Ace Inhibitors     Cough  . Doxycycline Nausea Only  . Hydrocodone Itching and Nausea Only  . Mercury     Other reaction(s): Unknown  . Silver Dermatitis    Severe itching  . Cephalexin Rash  . Clindamycin/Lincomycin Rash  . Nickel Rash  . Penicillins Rash    Has patient had a PCN reaction causing immediate rash, facial/tongue/throat swelling, SOB or lightheadedness with hypotension: YES Has patient had a PCN reaction causing severe rash involving mucus membranes or skin necrosis: NO Has patient had a PCN reaction that required hospitalization NO Has patient had a PCN reaction occurring within the last 10 years: NO If all of the above answers are "NO", then may proceed with Cephalosporin use.   VITALS:  Blood pressure (!) 93/50, pulse 64, temperature 98.1 F (36.7 C), temperature source Oral, resp. rate 19, height 4\' 11"  (1.499 m), weight 67.1 kg, SpO2 93 %. PHYSICAL EXAMINATION:   Physical Exam  Constitutional: She is oriented to person, place, and time and well-developed, well-nourished, and in no distress.  HENT:  Head: Normocephalic and atraumatic.  Right Ear: External ear normal.  Eyes: Pupils are equal, round, and reactive to light. Conjunctivae are normal. Right eye exhibits no discharge.  Neck: Normal range of motion. Neck supple. No thyromegaly present.  Cardiovascular: Normal rate.  Irregularly irregular  Pulmonary/Chest: Effort normal and breath sounds  normal. No respiratory distress.  Abdominal: Soft. Bowel sounds are normal. She exhibits no distension.  Musculoskeletal:        General: No edema.     Comments: Slight discomfort and ecchymosis around right hip fracture site  Neurological: She is alert and oriented to person, place, and time.  Skin: Skin is warm and dry. She is not diaphoretic. No erythema.  Psychiatric: Affect and judgment normal.   LABORATORY PANEL:  Female CBC Recent Labs  Lab 12/28/18 0008  12/29/18 1025  WBC 11.1*  --   --   HGB 6.1*   < > 7.2*  HCT 19.9*   < > 22.7*  PLT 170  --   --    < > = values in this interval not displayed.   ------------------------------------------------------------------------------------------------------------------ Chemistries  Recent Labs  Lab 12/25/18 2229  12/29/18 1025  NA 138   < > 135  K 4.1   < > 4.2  CL 96*   < > 92*  CO2 31   < > 31  GLUCOSE 139*   < > 106*  BUN 50*   < > 73*  CREATININE 1.65*   < > 2.17*  CALCIUM 9.4   < > 8.9  MG  --    < > 2.5*  AST 24  --   --   ALT 14  --   --   ALKPHOS 107  --   --   BILITOT 0.8  --   --    < > = values in this interval  not displayed.   RADIOLOGY:  No results found. ASSESSMENT AND PLAN:     1. Closed right hip fracture secondary to mechanical fall  Patient appears to have some swelling around her right hip. Patient seen by orthopedic service. Coagulopathy was reversed with vitamin K and INR down to 1.2. Patient seen by cardiologist.  Patient is chronically very high risk for any surgical procedures.  No further cardiovascular risk assessment will be beneficial however.  Patient transfused with 2 units of packed red blood cells previously  and hemoglobin improved to 7.3.  Hemoglobin dropped further to 6.1 unit overnight and patient being transfused today. Patient became hypoxic and hypotensive and had to be transferred to ICU Surgery currently on hold until patient hemodynamically stable.  2.  Acute on chronic  chronic combined systolic and diastolic CHF Patient being diuresed with IV Lasix twice daily initially.  Seen by nephrologist and patient started on Lasix drip low dose due to blood pressure being borderline.  Midodrine was also added to help maintain blood pressure while ongoing diuresis. Patient also required Levophed drip to maintain blood pressure.  3.  Paroxysmal atrial fibrillation and history of mechanical mitral valve; AV paced Rate controlled.  Resumed amiodarone.  Blood pressure being borderline, limiting resumption of some of patient's home medications. Heparin drip discontinued recently due to further drop in hemoglobin requiring more packed red blood cell transfusions.  Hemoglobin stable at 7.2 this morning. Will defer resumption of anticoagulation to cardiology service.  4.  Acute on chronic hypoxic respiratory failure Patient had to be transferred to ICU recently due to hypoxia requiring up to 6 L of oxygen via nasal cannula.  Patient uses 3 L of oxygen at home at baseline.  5.  Acute blood loss anemia Hemoglobin stable at 7.2 this morning following packed red blood cell transfusion  6.  Acute kidney injury superimposed on chronic kidney disease stage III Nephrology already following and assisting with management. Patient currently on low-dose of Lasix drip at 4 mg/h.  Requiring Levophed drip to maintain blood pressure.  Renal replacement therapy will be used as a last resort pending further decision regarding goal of care from family. Nephrology service following.  7.  Left lower extremity chronic wounds. Being managed by surgical service.  DVT prophylaxis; heparin drip discontinued due to anemia.  No SCDs due to lower extremity wounds Pulmonologist was at bedside updated patient's son and daughter this morning and to address goals and plan of care.  CODE STATUS: Full Code  TOTAL TIME TAKING CARE OF THIS PATIENT: 35 minutes.   More than 50% of the time was spent in  counseling/coordination of care: YES  POSSIBLE D/C IN 3 DAYS, DEPENDING ON CLINICAL CONDITION.   Marcelle Bebout M.D on 12/29/2018 at 2:12 PM  Between 7am to 6pm - Pager - [phone]  After 6pm go to [email or web address] - Proofreader  [facility]  Office  [phone]  CC: Primary care physician; Rusty Aus, MD  Note: This dictation was prepared with Dragon dictation along with smaller phrase technology. Any transcriptional errors that result from this process are unintentional.

## 2018-12-29 NOTE — Progress Notes (Signed)
CRITICAL CARE NOTE        SUBJECTIVE FINDINGS & SIGNIFICANT EVENTS   Patient remains critically ill, prognosis is guarded has been restless and uncomfortable for days.   Discussed case with Anesthesia today, high risk for general and will not be able to provide epidural anesthesia.    Patient remains on vasopressor support.  Dicussed with cardiology - would favor using heparin gtt due to [facility] and AF but patient has been bleeding post R hip fracture s/p 3 units pRBC transfusion ,will discuss with patient and family  Discussed with nephrology - patient and family request dialysis, nephrologist discussed risk/benefits and in lieu of worsening GFR with oliguria and likely continued worsening renal function, CRRT is offered to them if they wish.  I had separate discussion with family and they feel that fluid overload is preventing progress with surgical plan and as last resort effort they wish to move forward with CVVH in hopes of improving fluid balance, oxygenation, and hopefully decrease operative risk.  We will go ahead with temp HD access and proceed with CVVH for net negative fluid balance.   PAST MEDICAL HISTORY   Past Medical History:  Diagnosis Date  . Arthritis    "back, hands, knees" (04/10/2016)  . Chronic back pain   . 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 EF 25% by echo in 08/2015 b. RHC in 08/2015 showed normal filling pressures; c. 10/2018 Echo (in setting of atrial tachycardia): EF 25-30%, impaired relaxation. Glob HK. RVSP 73mmHg. Mod dil RA/LA. Mech MV w/ nl fxn (mean grad 13mmHg). Mod TR.   Marland Kitchen Chronic kidney disease   . Compression fracture    "several; all in my back" (04/10/2016)  . GERD (gastroesophageal reflux disease)   . Hypertension   .  Lymphedema   . Pacemaker 05/2017  . PAF (paroxysmal atrial fibrillation) ([place])    a. On amio & Coumadin - CHA2DS2VASc = 4.  . S/P MVR (mitral valve replacement)    a. MVR 1994 b. redo MVR in 08/2014 - on Coumadin  . Shortness of breath dyspnea      SURGICAL HISTORY   Past Surgical History:  Procedure Laterality Date  . BREAST LUMPECTOMY Right 2005?   benign lump excision  . CARDIAC CATHETERIZATION    . [place], 2016   "MVR; MVR"  . CARDIOVERSION N/A 11/06/2018   Procedure: CARDIOVERSION;  Surgeon: Minna Merritts, MD;  Location: [facility] ORS;  Service: Cardiovascular;  Laterality: N/A;  . CATARACT EXTRACTION W/PHACO Right 02/20/2015   Procedure: CATARACT EXTRACTION PHACO AND INTRAOCULAR LENS PLACEMENT ([place]);  Surgeon: Estill Cotta, MD;  Location: [facility] OR[facility];  Service: Ophthalmology;  Laterality: Right;  US01:31.2 AP 25.3% CDE40.13 Fluid lot # [id number]  . DRESSING CHANGE UNDER ANESTHESIA Left 11/17/2018   Procedure: DRESSING CHANGE;  Surgeon: Robert Bellow, MD;  Location: [facility] OR[facility];  Service: General;  Laterality: Left;  no anesthesia  . ELECTROPHYSIOLOGIC STUDY N/A 10/09/2015   Procedure: CARDIOVERSION;  Surgeon: Wellington Hampshire, MD;  Location: [facility] OR[facility];  Service: Cardiovascular;  Laterality: N/A;  . INCISION AND DRAINAGE OF WOUND Left 11/13/2018   Procedure: LEFT LEG DRESSING CHANGE;  Surgeon: Robert Bellow, MD;  Location: [facility] OR[facility];  Service: General;  Laterality: Left;  . INCISION AND DRAINAGE OF WOUND Left 11/19/2018   Procedure: LEFT LEG DRESSING CHANGE AND GRAFT APPLICATION;  Surgeon: Robert Bellow, MD;  Location: [facility];  Service: General;  Laterality: Left;  . IR GENERIC HISTORICAL  04/01/2016   IR RADIOLOGIST EVAL & MGMT 04/01/2016 [facility]  . IR GENERIC HISTORICAL  04/15/2016   IR VERTEBROPLASTY CERV/THOR BX INC UNI/BIL INC/INJECT/IMAGING 04/15/2016 Luanne Bras, MD [facility]  . IR GENERIC HISTORICAL  04/15/2016   IR  VERTEBROPLASTY EA ADDL (T&LS) BX INC UNI/BIL INC INJECT/IMAGING 04/15/2016 Luanne Bras, MD [facility]  . IR GENERIC HISTORICAL  04/15/2016   IR VERTEBROPLASTY EA ADDL (T&LS) BX INC UNI/BIL INC INJECT/IMAGING 04/15/2016 Luanne Bras, MD [facility]  . IR GENERIC HISTORICAL  05/20/2016   IR RADIOLOGIST EVAL & MGMT 05/20/2016 [facility]  . IR GENERIC HISTORICAL  06/13/2016   IR VERTEBROPLASTY EA ADDL (T&LS) BX INC UNI/BIL INC INJECT/IMAGING 06/13/2016 Luanne Bras, MD [facility]  . IR GENERIC HISTORICAL  06/13/2016   IR SACROPLASTY BILATERAL 06/13/2016 Luanne Bras, MD [facility]  . IR GENERIC HISTORICAL  06/13/2016   IR VERTEBROPLASTY LUMBAR BX INC UNI/BIL INC/INJECT/IMAGING 06/13/2016 Luanne Bras, MD [facility]  . IRRIGATION AND DEBRIDEMENT HEMATOMA Left 07/05/2015   Procedure: IRRIGATION AND DEBRIDEMENT HEMATOMA;  Surgeon: Robert Bellow, MD;  Location: [facility];  Service: General;  Laterality: Left;  . IRRIGATION AND DEBRIDEMENT HEMATOMA Left 11/11/2018   Procedure: IRRIGATION AND DEBRIDEMENT LEFT LEG HEMATOMA;  Surgeon: Robert Bellow, MD;  Location: [facility];  Service: General;  Laterality: Left;  . pyloric stenosis  07/1935  . US ECHOCARDIOGRAPHY       FAMILY HISTORY   Family History  Problem Relation Age of Onset  . Stroke Mother   . Renal Disease Father      SOCIAL HISTORY   Social History   Tobacco Use  . Smoking status: Former Smoker    Packs/day: 1.00    Years: 27.00    Pack years: 27.00    Types: Cigarettes    Quit date: 07/01/1980    Years since quitting: 38.5  . Smokeless tobacco: Never Used  . Tobacco comment: "quit smoking ~ 1980  Substance Use Topics  . Alcohol use: Not Currently    Alcohol/week: 0.0 standard drinks    Comment: 04/10/2016 "I'll have a drink on holidays/special occasions"  . Drug use: No     MEDICATIONS   Current Medication:  Current Facility-Administered Medications:  .  0.9 %  sodium  chloride infusion (Manually program via Guardrails IV Fluids), , Intravenous, Once, Ojie, Jude, MD .  acetaminophen (TYLENOL) tablet 650 mg, 650 mg, Oral, Q6H PRN, 650 mg at 12/28/18 2203 **OR** acetaminophen (TYLENOL) suppository 650 mg, 650 mg, Rectal, Q6H PRN, Lance Coon, MD .  albuterol (PROVENTIL) (2.5 MG/3ML) 0.083% nebulizer solution 2.5 mg, 2.5 mg, Nebulization, Q2H PRN, Ottie Glazier, MD .  amiodarone (PACERONE) tablet 200 mg, 200 mg, Oral, Daily, Ojie, Jude, MD, 200 mg at 12/29/18 0806 .  cholecalciferol (VITAMIN D3) tablet 2,000 Units, 2,000 Units, Oral, Daily, Ojie, Jude, MD, 2,000 Units at 12/29/18 0806 .  feeding supplement (ENSURE ENLIVE) (ENSURE ENLIVE) liquid 237 mL, 237 mL, Oral, BID BM, Ojie, Jude, MD, 237 mL at 12/28/18 1543 .  furosemide (LASIX) 250 mg in dextrose 5 % 250 mL (1 mg/mL) infusion, 4 mg/hr, Intravenous, Continuous, Singh, Harmeet, MD, Last Rate: 4 mL/hr at 12/29/18 0600, 4 mg/hr at 12/29/18 0600 .  ipratropium-albuterol (DUONEB) 0.5-2.5 (3) MG/3ML nebulizer solution 3 mL, 3 mL, Nebulization, TID, Lanney Gins, Nayson Traweek, MD, 3 mL at 12/28/18 2009 .  multivitamin-lutein (OCUVITE-LUTEIN) capsule 1 capsule, 1 capsule, Oral, Daily, Ojie, Jude, MD, 1 capsule  at 12/29/18 0807 .  norepinephrine (LEVOPHED) 4mg  in 229mL premix infusion, 0-40 mcg/min, Intravenous, Titrated, Bradly Bienenstock, NP, Stopped at 12/29/18 [phone] .  ondansetron (ZOFRAN) tablet 4 mg, 4 mg, Oral, Q6H PRN **OR** ondansetron (ZOFRAN) injection 4 mg, 4 mg, Intravenous, Q6H PRN, Lance Coon, MD .  polyethylene glycol (MIRALAX / GLYCOLAX) packet 17 g, 17 g, Oral, Daily, Ojie, Jude, MD .  traMADol (ULTRAM) tablet 50 mg, 50 mg, Oral, Q6H PRN, Lance Coon, MD, 50 mg at 12/26/18 2042 .  vitamin C (ASCORBIC ACID) tablet 250 mg, 250 mg, Oral, BID, Ojie, Jude, MD, 250 mg at 12/28/18 2203    ALLERGIES   Ace inhibitors, Doxycycline, Hydrocodone, Mercury, Silver, Cephalexin, Clindamycin/lincomycin, Nickel, and  Penicillins    REVIEW OF SYSTEMS     10 point ROS done and is negative except for R hip pain and mild aggitation.   PHYSICAL EXAMINATION   Vitals:   12/29/18 0600 12/29/18 0700  BP: 103/78 (!) 110/35  Pulse: 62 63  Resp: 19 (!) 27  Temp:  98.1 F (36.7 C)  SpO2: 100% 100%    GENERAL:Mild distress due to restlessness HEAD: Normocephalic, atraumatic.  EYES: Pupils equal, round, reactive to light.  No scleral icterus.  MOUTH: Moist mucosal membrane. NECK: Supple. No thyromegaly. No nodules. No JVD.  PULMONARY:crackles at bases bilaterally CARDIOVASCULAR: S1 and S2. Regular rate and rhythm. No murmurs, rubs, or gallops.  GASTROINTESTINAL: Soft, nontender, non-distended. No masses. Positive bowel sounds. No hepatosplenomegaly.  MUSCULOSKELETAL: No swelling, clubbing, or edema.  NEUROLOGIC: Mild distress due to acute illness SKIN:intact,warm,dry   LABS AND IMAGING       LAB RESULTS: Recent Labs  Lab 12/26/18 0719 12/27/18 0740 12/28/18 0008  NA 139 137 133*  K 3.8 4.4 4.5  CL 98 96* 93*  CO2 32 32 31  BUN 53* 56* 66*  CREATININE 1.67* 2.22* 2.51*  GLUCOSE 111* 111* 127*   Recent Labs  Lab 12/26/18 0719  12/27/18 0740 12/28/18 0008 12/28/18 0739 12/28/18 1419 12/28/18 1830  HGB 5.9*   < > 7.3* 6.1* 7.5* 7.0* 7.2*  HCT 20.0*   < > 23.7* 19.9* 23.7* 21.6* 23.0*  WBC 8.4  --  12.6* 11.1*  --   --   --   PLT 175  --  182 170  --   --   --    < > = values in this interval not displayed.     IMAGING RESULTS: No results found.    ASSESSMENT AND PLAN    -Multidisciplinary rounds held today  Acute on chronic hypoxemic respiratory failure -Due tochronicsystolic CHF with EF 25 to 30% -Cardiology on case appreciate input-follow recommendations -Currently being diuresed by nephrology  -Complicated byacute blood loss anemia    Chronic HFrEF with EF 04-88%  -Complicated by AF and mechMV requiring AC - discussed with cardiology Dr End - concern  for high risk for acute CVA without AC - will await discourse with palliative care team and family then re-address [facility] in lieu of high risk for complications with and without blood thinners   Acute blood loss anemia   - due to R hip fracture and hematoma formation    - s/p 3 units prbc transfusion     - h/h borderline low, will transfuse overnight as she is likely to lose some blood in CVVH circuit.    Rt hip fracture      -discussed case and care plan with  Dr Marry Guan and anesthesiology Dr  [place] - appreciate input    - high risk patient holding plan for surgery until further eval    Pulmonary pre-operative evaluation  ARISCAT ([place]) preoperative pulmonary risk index in adults- High risk - 42.1% chance of any pulmonary complication postoperatively           -Mainly due to advanced age, baseline chronic hypoxemia, preoperative anemia, significant comorbid conditions  [place] respiratory failure index - moderate-high risk risk- 10.1% chance of post operative respiratory failure requiring MV >48h  GUPTA calculator for postoperative respiratory failure - moderate risk - 4.03% chance of postoperative respiratory failure requiring mechanical ventilation over 48 hours     Bilateral atelectasis -Contributing to hypoxemia - chest PT - MetaNeb BID - Incentive spirometer to be used each hour multiple times - please encourage to use  - patient has difficulty expectorating phlegm - will ask RT to bring Acapella device as well    Moderate protein calorie malnutrition and deconditioning -Contributing to dyspnea -Patient working with physical therapy was able to get out of bed and slowly walk today but encouraged to continue this as well as outpatient -Nutritional assessment patient has been aspirating and has been evaluated with modified barium swallow recommended aspiration precautions-s/p dietician evaluation - appreciate input       Renal Failure-most likely due  cardiorenal process  currently on lasix with levophed support for hypotension -follow chem 7 -follow UO -continue Foley Catheter-assess need daily    GI/Nutrition GI PROPHYLAXIS as indicated DIET-->TF's as tolerated Constipation protocol as indicated  ENDO - ICU hypoglycemic\Hyperglycemia protocol -check FSBS per protocol   ELECTROLYTES -follow labs as needed -replace as needed -pharmacy consultation   DVT/GI PRX ordered -SCDs  TRANSFUSIONS AS NEEDED MONITOR FSBS ASSESS the need for LABS as needed   Critical care provider statement:    Critical care time (minutes):  109   Critical care time was exclusive of:  Separately billable procedures and treating other patients   Critical care was necessary to treat or prevent imminent or life-threatening deterioration of the following conditions:  acute hypoxemic respiratory failure, AKI, acute blood loss anemia, Rt hip fracture, multiple comorbid conditoins   Critical care was time spent personally by me on the following activities:  Development of treatment plan with patient or surrogate, discussions with consultants, evaluation of patient's response to treatment, examination of patient, obtaining history from patient or surrogate, ordering and performing treatments and interventions, ordering and review of laboratory studies and re-evaluation of patient's condition.  I assumed direction of critical care for this patient from another provider in my specialty: no    This document was prepared using Dragon voice recognition software and may include unintentional dictation errors.    Ottie Glazier, M.D.  Division of [place]

## 2018-12-29 NOTE — Progress Notes (Signed)
[facility], [place] 12/29/18  Subjective:  Patient was found to be oliguric yesterday. However BUN currently 73 with a creatinine of 2.17. Still on Lasix drip Hypotension persists.  Objective:  Vital signs in last 24 hours:  Temp:  [97.8 F (36.6 C)-98.2 F (36.8 C)] 98.1 F (36.7 C) (06/30 0700) Pulse Rate:  [59-79] 64 (06/30 1200) Resp:  [12-31] 19 (06/30 1200) BP: (82-126)/(35-88) 93/50 (06/30 1200) SpO2:  [93 %-100 %] 93 % (06/30 1200) FiO2 (%):  [50 %-70 %] 50 % (06/30 0700)  Weight change:  Filed Weights   12/25/18 2219  Weight: 67.1 kg    Intake/Output:    Intake/Output Summary (Last 24 hours) at 12/29/2018 1325 Last data filed at 12/29/2018 0100 Gross per 24 hour  Intake 180.51 ml  Output 250 ml  Net -69.49 ml     Physical Exam: General:  Frail elderly lady, laying in the bed  HEENT  moist oral mucous membranes  Neck  supple no JVD,  Pulm/lungs  bibasilar rales  CVS/Heart  irregular, 3/6 SEM  Abdomen:   Soft, nontender  Extremities:  2-3+ pitting edema approximately over the thighs  Neurologic:  Awake but confused  Skin:  Right leg ecchymosis, resolving hematoma    Basic Metabolic Panel:  Recent Labs  Lab 12/25/18 2229 12/26/18 0719 12/27/18 0740 12/28/18 0008 12/29/18 1025  NA 138 139 137 133* 135  K 4.1 3.8 4.4 4.5 4.2  CL 96* 98 96* 93* 92*  CO2 31 32 32 31 31  GLUCOSE 139* 111* 111* 127* 106*  BUN 50* 53* 56* 66* 73*  CREATININE 1.65* 1.67* 2.22* 2.51* 2.17*  CALCIUM 9.4 9.0 8.9 8.3* 8.9  MG  --   --  2.4 2.5* 2.5*  PHOS  --   --   --  4.6  --      CBC: Recent Labs  Lab 12/25/18 2229 12/26/18 0719  12/27/18 0740 12/28/18 0008 12/28/18 0739 12/28/18 1419 12/28/18 1830 12/29/18 1025  WBC 6.3 8.4  --  12.6* 11.1*  --   --   --   --   NEUTROABS 4.7  --   --   --   --   --   --   --   --   HGB 8.0* 5.9*   < > 7.3* 6.1* 7.5* 7.0* 7.2* 7.2*  HCT 27.2* 20.0*   < > 23.7* 19.9* 23.7* 21.6* 23.0* 22.7*   MCV 82.7 83.0  --  85.6 84.7  --   --   --   --   PLT 182 175  --  182 170  --   --   --   --    < > = values in this interval not displayed.     No results found for: HEPBSAG, HEPBSAB, HEPBIGM    Microbiology:  Recent Results (from the past 240 hour(s))  SARS Coronavirus 2 (CEPHEID- Performed in [place] hospital lab), Hosp Order     Status: None   Collection Time: 12/26/18 12:33 AM   Specimen: Nasopharyngeal Swab  Result Value Ref Range Status   SARS Coronavirus 2 NEGATIVE NEGATIVE Final    Comment: (NOTE) If result is NEGATIVE SARS-CoV-2 target nucleic acids are NOT DETECTED. The SARS-CoV-2 RNA is generally detectable in upper and lower  respiratory specimens during the acute phase of infection. The lowest  concentration of SARS-CoV-2 viral copies this assay can detect is 250  copies / mL. A negative result does not preclude  SARS-CoV-2 infection  and should not be used as the sole basis for treatment or other  patient management decisions.  A negative result may occur with  improper specimen collection / handling, submission of specimen other  than nasopharyngeal swab, presence of viral mutation(s) within the  areas targeted by this assay, and inadequate number of viral copies  (<250 copies / mL). A negative result must be combined with clinical  observations, patient history, and epidemiological information. If result is POSITIVE SARS-CoV-2 target nucleic acids are DETECTED. The SARS-CoV-2 RNA is generally detectable in upper and lower  respiratory specimens dur ing the acute phase of infection.  Positive  results are indicative of active infection with SARS-CoV-2.  Clinical  correlation with patient history and other diagnostic information is  necessary to determine patient infection status.  Positive results do  not rule out bacterial infection or co-infection with other viruses. If result is PRESUMPTIVE POSTIVE SARS-CoV-2 nucleic acids MAY BE PRESENT.   A  presumptive positive result was obtained on the submitted specimen  and confirmed on repeat testing.  While 2019 novel coronavirus  (SARS-CoV-2) nucleic acids may be present in the submitted sample  additional confirmatory testing may be necessary for epidemiological  and / or clinical management purposes  to differentiate between  SARS-CoV-2 and other Sarbecovirus currently known to infect humans.  If clinically indicated additional testing with an alternate test  methodology [phone]) is advised. The SARS-CoV-2 RNA is generally  detectable in upper and lower respiratory sp ecimens during the acute  phase of infection. The expected result is Negative. Fact Sheet for Patients:  StrictlyIdeas.no Fact Sheet for Healthcare Providers: BankingDealers.co.za This test is not yet approved or cleared by the [place] FDA and has been authorized for detection and/or diagnosis of SARS-CoV-2 by FDA under an Emergency Use Authorization (EUA).  This EUA will remain in effect (meaning this test can be used) for the duration of the COVID-19 declaration under Section 564(b)(1) of the Act, 21 U.S.C. section 360bbb-3(b)(1), unless the authorization is terminated or revoked sooner. Performed at [facility], [address] [place], [place] [address]   MRSA PCR Screening     Status: None   Collection Time: 12/26/18  1:54 AM   Specimen: Nasal Mucosa; Nasopharyngeal  Result Value Ref Range Status   MRSA by PCR NEGATIVE NEGATIVE Final    Comment:        The GeneXpert MRSA Assay (FDA approved for NASAL specimens only), is one component of a comprehensive MRSA colonization surveillance program. It is not intended to diagnose MRSA infection nor to guide or monitor treatment for MRSA infections. Performed at [facility], [address] [place], [place] [address]   CULTURE, BLOOD (ROUTINE X 2) w Reflex to ID Panel     Status: None  (Preliminary result)   Collection Time: 12/28/18  3:09 PM   Specimen: BLOOD  Result Value Ref Range Status   Specimen Description BLOOD RIGHT ANTECUBITAL  Final   Special Requests   Final    BOTTLES DRAWN AEROBIC AND ANAEROBIC Blood Culture adequate volume   Culture   Final    NO GROWTH < 24 HOURS Performed at [facility], [address] [place], [place] [address]    Report Status PENDING  Incomplete  CULTURE, BLOOD (ROUTINE X 2) w Reflex to ID Panel     Status: None (Preliminary result)   Collection Time: 12/28/18  3:19 PM   Specimen: BLOOD  Result Value Ref Range  Status   Specimen Description BLOOD BLOOD RIGHT HAND  Final   Special Requests   Final    BOTTLES DRAWN AEROBIC AND ANAEROBIC Blood Culture adequate volume   Culture   Final    NO GROWTH < 24 HOURS Performed at [facility], [address] [place], [place] [address]    Report Status PENDING  Incomplete    Coagulation Studies: Recent Labs    12/26/18 1534 12/27/18 0740  LABPROT 21.1* 15.5*  INR 1.9* 1.2    Urinalysis: Recent Labs    12/27/18 1816  COLORURINE YELLOW*  LABSPEC 1.016  PHURINE 5.0  GLUCOSEU NEGATIVE  HGBUR NEGATIVE  BILIRUBINUR NEGATIVE  KETONESUR NEGATIVE  PROTEINUR 30*  NITRITE NEGATIVE  LEUKOCYTESUR NEGATIVE      Imaging: Dg Chest Port 1 View  Result Date: 12/28/2018 CLINICAL DATA:  Acute respiratory failure EXAM: PORTABLE CHEST 1 VIEW COMPARISON:  Three days ago FINDINGS: Layering pleural effusions and marked cardiomegaly. There is been mitral valve replacement and biventricular pacer implant. Vascular congestion/edema and streaky lung opacity suggesting atelectasis. IMPRESSION: CHF pattern including bilateral moderate pleural effusion. Electronically Signed   By: Monte Fantasia M.D.   On: 12/28/2018 05:56     Medications:   . furosemide (LASIX) infusion 4 mg/hr (12/29/18 0600)  . norepinephrine (LEVOPHED) Adult infusion Stopped (12/29/18 0615)   .  sodium chloride   Intravenous Once  . amiodarone  200 mg Oral Daily  . cholecalciferol  2,000 Units Oral Daily  . feeding supplement (ENSURE ENLIVE)  237 mL Oral BID BM  . ipratropium-albuterol  3 mL Nebulization TID  . midodrine  5 mg Oral TID WC  . multivitamin-lutein  1 capsule Oral Daily  . polyethylene glycol  17 g Oral Daily  . vitamin C  250 mg Oral BID   acetaminophen **OR** acetaminophen, albuterol, ondansetron **OR** ondansetron (ZOFRAN) IV, traMADol  Assessment/ Plan:  84 y.o.Caucasian  female with congestive heart failure EF 25%, chronic kidney disease, hypertension, atrial fibrillation, lymphedema, mitral valve replacement (mechanical valve) requiring chronic anticoagulation, COPD, osteoporosis, history of multiple lumbar compression fractures requiring kyphoplasty, history of left leg hematoma with evacuation and porcine allograft, was admitted on 12/25/2018 with right hip fracture.   1.  Acute kidney injury, chronic kidney disease stage III.  Baseline 1.37/GFR 36 from December 15, 2018 2.  Chronic systolic congestive heart failure with EF 25% 3.  Lower extremity edema with third spacing 4.  Pulmonary hypertension 5.  Mechanical heart valve requiring chronic anticoagulation  -Urine output over the preceding 24 hours was found to be 250 cc.  We had a long discussion with the patient's son and daughter..  In addition we discussed the potential for renal replacement therapy.  We discussed risk, benefits, and alternatives to renal placement therapy.  The family would like additional time to consider the possibilities.  Therefore for now continue supportive care with Lasix drip.  Case was also discussed in depth with pulmonary/critical care today.   LOS: 4 Rudra Hobbins 6/30/20201:25 PM  [place], [place]  Note: This note was prepared with Dragon dictation. Any transcription errors are unintentional

## 2018-12-29 NOTE — Consult Note (Signed)
Consultation Note Date: 12/29/2018   Patient Name: [name]  DOB: 01/27/1935  MRN: [id number]  Age / Sex: 84 y.o., female  PCP: Rusty Aus, MD Referring Physician: Otila Back, MD  Reason for Consultation: Establishing goals of care  HPI/Patient Profile: Patient presents to the ED after mechanical fall and subsequent right hip pain.  Imaging here showed right hip fracture.  Clinical Assessment and Goals of Care: Patient is resting in bed, she is confused. Her daughter [name] is at bedside. Patient lives at home with her daughter. There are 2 children. She discusses the family being a family of faith and that they go to church with several physicians at [facility] who are being supportive.   Daughter states Ms. Bannister uses O2 and a walker at baseline. She had been going to heart track several times per week. She states her mother has had issues with swallowing, and has been evaluated previously for this and told she was developing a stricture. Three months ago, she was not able to drive, but was self sufficient. Earlier in June, she was admitted with heart failure and a lower extremity ulcer. [name] voices concern with her treatment last admission. She states since around Mother's Day, she has had decline.     We discussed her diagnosis, prognosis, GOC, EOL wishes disposition and options.  A detailed discussion was had today regarding advanced directives.  Concepts specific to code status, artifical feeding and hydration were discussed.  The difference between an aggressive medical intervention path and a comfort care path was discussed.  Values and goals of care important to patient and family were attempted to be elicited.  Discussed limitations of medical interventions to prolong quality of life in some situations and discussed the concept of human mortality.  [name] states they would like to complete  dialysis today in hopes her mental status will improve enough to make decisions for herself; if it does not, they will consider hospice care. She states they are trying to make decisions as they come, as this is a lot for them. [name] states it does not appear Ms. Brutus will be able to have surgery for her fracture because anesthesia does not feel she would be a good candidate, and she would need to move to a different hospital for regional anesthesia. [name] feels they need to remain here where people are familiar.  We discussed QOL. She states her mother "is fearful of not staying alive". She states her mother would want resuscitation.        SUMMARY OF RECOMMENDATIONS   Full scope/full code.    Prognosis:   Unable to determine  Discharge Planning: To Be Determined      Primary Diagnoses: Present on Admission: . Chronic combined systolic and diastolic CHF  . HLD (hyperlipidemia) . Chronic kidney disease (CKD), stage IV (severe) ([place]) . GERD (gastroesophageal reflux disease) . PAF (paroxysmal atrial fibrillation) ([place]) . Closed right hip fracture ([place]) . Hip fracture ([place])   I have reviewed the medical record, interviewed the  patient and family, and examined the patient. The following aspects are pertinent.  Past Medical History:  Diagnosis Date  . Arthritis    "back, hands, knees" (04/10/2016)  . Chronic back pain   . 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 EF 25% by echo in 08/2015 b. RHC in 08/2015 showed normal filling pressures; c. 10/2018 Echo (in setting of atrial tachycardia): EF 25-30%, impaired relaxation. Glob HK. RVSP 54mmHg. Mod dil RA/LA. Mech MV w/ nl fxn (mean grad 15mmHg). Mod TR.   Marland Kitchen Chronic kidney disease   . Compression fracture    "several; all in my back" (04/10/2016)  . GERD (gastroesophageal reflux disease)   . Hypertension   . Lymphedema   . Pacemaker 05/2017  . PAF (paroxysmal atrial fibrillation)  ([place])    a. On amio & Coumadin - CHA2DS2VASc = 4.  . S/P MVR (mitral valve replacement)    a. MVR 1994 b. redo MVR in 08/2014 - on Coumadin  . Shortness of breath dyspnea    Social History   Socioeconomic History  . Marital status: Widowed    Spouse name: Not on file  . Number of children: Not on file  . Years of education: Not on file  . Highest education level: Not on file  Occupational History  . Not on file  Social Needs  . Financial resource strain: Not on file  . Food insecurity    Worry: Not on file    Inability: Not on file  . Transportation needs    Medical: Not on file    Non-medical: Not on file  Tobacco Use  . Smoking status: Former Smoker    Packs/day: 1.00    Years: 27.00    Pack years: 27.00    Types: Cigarettes    Quit date: 07/01/1980    Years since quitting: 38.5  . Smokeless tobacco: Never Used  . Tobacco comment: "quit smoking ~ 1980  Substance and Sexual Activity  . Alcohol use: Not Currently    Alcohol/week: 0.0 standard drinks    Comment: 04/10/2016 "I'll have a drink on holidays/special occasions"  . Drug use: No  . Sexual activity: Never  Lifestyle  . Physical activity    Days per week: Not on file    Minutes per session: Not on file  . Stress: Not on file  Relationships  . Social Herbalist on phone: Not on file    Gets together: Not on file    Attends religious service: Not on file    Active member of club or organization: Not on file    Attends meetings of clubs or organizations: Not on file    Relationship status: Not on file  Other Topics Concern  . Not on file  Social History Narrative  . Not on file   Family History  Problem Relation Age of Onset  . Stroke Mother   . Renal Disease Father    Scheduled Meds: . sodium chloride   Intravenous Once  . amiodarone  200 mg Oral Daily  . cholecalciferol  2,000 Units Oral Daily  . feeding supplement (ENSURE ENLIVE)  237 mL Oral BID BM  . ipratropium-albuterol  3 mL  Nebulization TID  . midodrine  5 mg Oral TID WC  . multivitamin-lutein  1 capsule Oral Daily  . polyethylene glycol  17 g Oral Daily  . vitamin C  250 mg Oral BID   Continuous Infusions: . furosemide (  LASIX) infusion 4 mg/hr (12/29/18 0600)  . norepinephrine (LEVOPHED) Adult infusion Stopped (12/29/18 0615)   PRN Meds:.acetaminophen **OR** acetaminophen, albuterol, ondansetron **OR** ondansetron (ZOFRAN) IV, traMADol Medications Prior to Admission:  Prior to Admission medications   Medication Sig Start Date End Date Taking? Authorizing Provider  acetaminophen (TYLENOL) 650 MG CR tablet Take 1 tablet (650 mg total) by mouth every 6 (six) hours as needed for pain (mild and moderate pain). 11/30/18  Yes Gladstone Lighter, MD  albuterol (PROVENTIL) (2.5 MG/3ML) 0.083% nebulizer solution Take 3 mLs (2.5 mg total) by nebulization every 6 (six) hours as needed for wheezing or shortness of breath. 11/30/18  Yes Gladstone Lighter, MD  amiodarone (PACERONE) 200 MG tablet Take 1 tablet (200 mg total) by mouth daily. 09/24/18  Yes Minna Merritts, MD  carvedilol (COREG) 3.125 MG tablet Take 1 tablet (3.125 mg total) by mouth 2 (two) times daily with a meal. 12/15/18  Yes Gouru, Aruna, MD  Cholecalciferol (VITAMIN D) 2000 units tablet Take 2,000 Units by mouth daily.   Yes [provider]  furosemide (LASIX) 40 MG tablet Take 1 tablet (40 mg total) by mouth 2 (two) times daily. 11/30/18  Yes Gladstone Lighter, MD  hydrOXYzine (ATARAX/VISTARIL) 25 MG tablet Take 1 tablet (25 mg total) by mouth 3 (three) times daily as needed for itching or anxiety. 12/15/18  Yes Gouru, Aruna, MD  ipratropium-albuterol (DUONEB) 0.5-2.5 (3) MG/3ML SOLN Take 3 mLs by nebulization every 6 (six) hours as needed for up to 90 doses. 12/15/18  Yes Gouru, Illene Silver, MD  losartan (COZAAR) 25 MG tablet Take 0.5 tablets (12.5 mg total) by mouth daily. 12/16/18  Yes Gouru, Aruna, MD  Menthol (ICY HOT) 5 % PTCH Apply 1 patch topically  daily as needed (pain).   Yes [provider]  metolazone (ZAROXOLYN) 2.5 MG tablet Take 1 tablet (2.5 mg total) once daily on Monday Wednesday and Friday. 12/23/18  Yes Theora Gianotti, NP  polyethylene glycol (MIRALAX / GLYCOLAX) 17 g packet Take 17 g by mouth daily. 11/30/18  Yes Gladstone Lighter, MD  potassium chloride SA (K-DUR) 20 MEQ tablet Take 1 tablet (20 mEq total) by mouth daily. Patient taking differently: Take 10 mEq by mouth daily.  11/30/18  Yes Gladstone Lighter, MD  spironolactone (ALDACTONE) 25 MG tablet Take 25 mg by mouth daily.   Yes [provider]  traMADol (ULTRAM) 50 MG tablet Take 1 tablet (50 mg total) by mouth every 6 (six) hours as needed for moderate pain. 12/15/18  Yes Gouru, Illene Silver, MD  vitamin C (ASCORBIC ACID) 500 MG tablet Take 500 mg by mouth daily.   Yes [provider]  warfarin (COUMADIN) 6 MG tablet Take 1 tablet (6 mg total) by mouth daily. Patient taking differently: Take 3-6 mg by mouth See admin instructions. Take  tablet (3mg ) by mouth every Monday, Wednesday and Friday evening and take 1 tablet (6mg ) by mouth every Sunday, Tuesday, Thursday and Saturday evening 12/23/18  Yes Theora Gianotti, NP  zinc sulfate 220 (50 Zn) MG capsule Take 1 capsule (220 mg total) by mouth daily. 12/01/18  Yes Gladstone Lighter, MD  multivitamin-lutein [facility]) CAPS capsule Take 1 capsule by mouth daily. Patient not taking: Reported on 12/27/2018 12/16/18   Nicholes Mango, MD   Allergies  Allergen Reactions  . Ace Inhibitors     Cough  . Doxycycline Nausea Only  . Hydrocodone Itching and Nausea Only  . Mercury     Other reaction(s): Unknown  .  Silver Dermatitis    Severe itching  . Cephalexin Rash  . Clindamycin/Lincomycin Rash  . Nickel Rash  . Penicillins Rash    Has patient had a PCN reaction causing immediate rash, facial/tongue/throat swelling, SOB or lightheadedness with hypotension: YES Has patient had a PCN  reaction causing severe rash involving mucus membranes or skin necrosis: NO Has patient had a PCN reaction that required hospitalization NO Has patient had a PCN reaction occurring within the last 10 years: NO If all of the above answers are "NO", then may proceed with Cephalosporin use.   Review of Systems  All other systems reviewed and are negative.   Physical Exam HENT:     Head: Normocephalic.  Neurological:     Mental Status: She is alert.     Vital Signs: BP (!) 93/50   Pulse 64   Temp 98.1 F (36.7 C) (Oral)   Resp 19   Ht 4\' 11"  (1.499 m)   Wt 67.1 kg   SpO2 93%   BMI 29.89 kg/m  Pain Scale: 0-10 POSS *See Group Information*: 1-Acceptable,Awake and alert Pain Score: 0-No pain   SpO2: SpO2: 93 % O2 Device:SpO2: 93 % O2 Flow Rate: .O2 Flow Rate (L/min): 40 L/min  IO: Intake/output summary:   Intake/Output Summary (Last 24 hours) at 12/29/2018 1546 Last data filed at 12/29/2018 0100 Gross per 24 hour  Intake 157.97 ml  Output 250 ml  Net -92.03 ml    LBM:   Baseline Weight: Weight: 67.1 kg Most recent weight: Weight: 67.1 kg     Palliative Assessment/Data:     Time In: 3:25 Time Out: 4:00 Time Total: 35 min Greater than 50%  of this time was spent counseling and coordinating care related to the above assessment and plan.  Signed by: Asencion Gowda, NP   Please contact Palliative Medicine Team phone at [phone] for questions and concerns.  For individual provider: See Shea Evans

## 2018-12-29 NOTE — Progress Notes (Signed)
CRRT Medication Adjustments:    Pharmacy consulted for medication adjustments for 84 yo female initiated on CRRT on 6/30. No adjustments warranted at this time.   Pharmacy will continue to monitor and adjust per consult.   Pearla Dubonnet, PharmD Clinical Pharmacist 12/29/2018 5:52 PM

## 2018-12-30 ENCOUNTER — Encounter
Admission: EM | Disposition: A | Discharge status: Other Acute Inpatient Hospital | Payer: Self-pay | Source: Home / Self Care | Attending: Internal Medicine

## 2018-12-30 ENCOUNTER — Ambulatory Visit: Payer: Medicare Other | Admitting: Plastic Surgery

## 2018-12-30 ENCOUNTER — Ambulatory Visit: Payer: Medicare Other | Admitting: Physician Assistant

## 2018-12-30 LAB — RENAL FUNCTION PANEL
Albumin: 3.4 g/dL — ABNORMAL LOW (ref 3.5–5.0)
Albumin: 3 g/dL — ABNORMAL LOW (ref 3.5–5.0)
Albumin: 3 g/dL — ABNORMAL LOW (ref 3.5–5.0)
Albumin: 3 g/dL — ABNORMAL LOW (ref 3.5–5.0)
Anion gap: 10 (ref 5–15)
Anion gap: 9 (ref 5–15)
Anion gap: 2 — ABNORMAL LOW (ref 5–15)
Anion gap: 7 (ref 5–15)
BUN: 51 mg/dL — ABNORMAL HIGH (ref 8–23)
BUN: 38 mg/dL — ABNORMAL HIGH (ref 8–23)
BUN: 29 mg/dL — ABNORMAL HIGH (ref 8–23)
BUN: 25 mg/dL — ABNORMAL HIGH (ref 8–23)
CO2: 30 mmol/L (ref 22–32)
CO2: 28 mmol/L (ref 22–32)
CO2: 32 mmol/L (ref 22–32)
CO2: 29 mmol/L (ref 22–32)
Calcium: 8.7 mg/dL — ABNORMAL LOW (ref 8.9–10.3)
Calcium: 8.4 mg/dL — ABNORMAL LOW (ref 8.9–10.3)
Calcium: 8.4 mg/dL — ABNORMAL LOW (ref 8.9–10.3)
Calcium: 8.6 mg/dL — ABNORMAL LOW (ref 8.9–10.3)
Chloride: 95 mmol/L — ABNORMAL LOW (ref 98–111)
Chloride: 99 mmol/L (ref 98–111)
Chloride: 104 mmol/L (ref 98–111)
Chloride: 101 mmol/L (ref 98–111)
Creatinine, Ser: 1.39 mg/dL — ABNORMAL HIGH (ref 0.44–1.00)
Creatinine, Ser: 1.17 mg/dL — ABNORMAL HIGH (ref 0.44–1.00)
Creatinine, Ser: 0.81 mg/dL (ref 0.44–1.00)
Creatinine, Ser: 0.73 mg/dL (ref 0.44–1.00)
GFR calc Af Amer: 41 mL/min — ABNORMAL LOW (ref >60)
GFR calc Af Amer: 51 mL/min — ABNORMAL LOW (ref >60)
GFR calc Af Amer: >60 mL/min (ref >60)
GFR calc Af Amer: >60 mL/min (ref >60)
GFR calc non Af Amer: 35 mL/min — ABNORMAL LOW (ref >60)
GFR calc non Af Amer: 44 mL/min — ABNORMAL LOW (ref >60)
GFR calc non Af Amer: >60 mL/min (ref >60)
GFR calc non Af Amer: >60 mL/min (ref >60)
Glucose, Bld: 116 mg/dL — ABNORMAL HIGH (ref 70–99)
Glucose, Bld: 111 mg/dL — ABNORMAL HIGH (ref 70–99)
Glucose, Bld: 109 mg/dL — ABNORMAL HIGH (ref 70–99)
Glucose, Bld: 111 mg/dL — ABNORMAL HIGH (ref 70–99)
Phosphorus: 2.6 mg/dL (ref 2.5–4.6)
Phosphorus: 2.6 mg/dL (ref 2.5–4.6)
Phosphorus: 2.3 mg/dL — ABNORMAL LOW (ref 2.5–4.6)
Phosphorus: 2.1 mg/dL — ABNORMAL LOW (ref 2.5–4.6)
Potassium: 4 mmol/L (ref 3.5–5.1)
Potassium: 4 mmol/L (ref 3.5–5.1)
Potassium: 4.2 mmol/L (ref 3.5–5.1)
Potassium: 4.1 mmol/L (ref 3.5–5.1)
Sodium: 135 mmol/L (ref 135–145)
Sodium: 136 mmol/L (ref 135–145)
Sodium: 138 mmol/L (ref 135–145)
Sodium: 137 mmol/L (ref 135–145)

## 2018-12-30 LAB — CBC
HCT: 24.4 % — ABNORMAL LOW (ref 36.0–46.0)
Hemoglobin: 7.4 g/dL — ABNORMAL LOW (ref 12.0–15.0)
MCH: 26.6 pg (ref 26.0–34.0)
MCHC: 30.3 g/dL (ref 30.0–36.0)
MCV: 87.8 fL (ref 80.0–100.0)
Platelets: 122 10*3/uL — ABNORMAL LOW (ref 150–400)
RBC: 2.78 MIL/uL — ABNORMAL LOW (ref 3.87–5.11)
RDW: 17 % — ABNORMAL HIGH (ref 11.5–15.5)
WBC: 9.6 10*3/uL (ref 4.0–10.5)
nRBC: 0 % (ref 0.0–0.2)

## 2018-12-30 LAB — HEPARIN LEVEL (UNFRACTIONATED)
Heparin Unfractionated: 0.12 IU/mL — ABNORMAL LOW (ref 0.30–0.70)
Heparin Unfractionated: 0.26 IU/mL — ABNORMAL LOW (ref 0.30–0.70)

## 2018-12-30 LAB — HEMOGLOBIN AND HEMATOCRIT, BLOOD
HCT: 24 % — ABNORMAL LOW (ref 36.0–46.0)
HCT: 24.7 % — ABNORMAL LOW (ref 36.0–46.0)
HCT: 25.6 % — ABNORMAL LOW (ref 36.0–46.0)
HCT: 26.1 % — ABNORMAL LOW (ref 36.0–46.0)
Hemoglobin: 7.3 g/dL — ABNORMAL LOW (ref 12.0–15.0)
Hemoglobin: 7.5 g/dL — ABNORMAL LOW (ref 12.0–15.0)
Hemoglobin: 7.8 g/dL — ABNORMAL LOW (ref 12.0–15.0)
Hemoglobin: 8.2 g/dL — ABNORMAL LOW (ref 12.0–15.0)

## 2018-12-30 LAB — PREPARE RBC (CROSSMATCH)

## 2018-12-30 LAB — MAGNESIUM
Magnesium: 2.3 mg/dL (ref 1.7–2.4)
Magnesium: 2.2 mg/dL (ref 1.7–2.4)
Magnesium: 1.9 mg/dL (ref 1.7–2.4)
Magnesium: 2 mg/dL (ref 1.7–2.4)

## 2018-12-30 SURGERY — FIXATION, FRACTURE, INTERTROCHANTERIC, WITH INTRAMEDULLARY ROD
Anesthesia: Choice | Laterality: Right

## 2018-12-30 MED ORDER — DIGOXIN 0.25 MG/ML IJ SOLN
0.1250 mg | Freq: Every day | INTRAMUSCULAR | Status: DC
  Administered 2018-12-30 – 2019-01-04 (×6): 0.125 mg via INTRAVENOUS
  Filled 2018-12-30 (×6): qty 2

## 2018-12-30 MED ORDER — CHLORHEXIDINE GLUCONATE CLOTH 2 % EX PADS
6.0000 | MEDICATED_PAD | Freq: Every day | CUTANEOUS | Status: DC
  Administered 2018-12-30 – 2019-01-05 (×4): 6 via TOPICAL

## 2018-12-30 MED ORDER — HALOPERIDOL LACTATE 5 MG/ML IJ SOLN
5.0000 mg | Freq: Once | INTRAMUSCULAR | Status: AC
  Administered 2018-12-30: 5 mg via INTRAVENOUS
  Filled 2018-12-30: qty 1

## 2018-12-30 MED ORDER — TRAZODONE HCL 50 MG PO TABS
50.0000 mg | ORAL_TABLET | Freq: Every evening | ORAL | Status: DC | PRN
  Administered 2018-12-31: 50 mg via ORAL
  Filled 2018-12-30 (×2): qty 1

## 2018-12-30 MED ORDER — TRAMADOL HCL 50 MG PO TABS
50.0000 mg | ORAL_TABLET | Freq: Two times a day (BID) | ORAL | Status: DC | PRN
  Administered 2018-12-31 – 2019-01-04 (×4): 50 mg via ORAL
  Filled 2018-12-30 (×5): qty 1

## 2018-12-30 MED ORDER — DIGOXIN 125 MCG PO TABS
0.1250 mg | ORAL_TABLET | Freq: Every day | ORAL | Status: DC
  Filled 2018-12-30: qty 1

## 2018-12-30 MED ORDER — LORAZEPAM 2 MG/ML IJ SOLN
1.0000 mg | Freq: Once | INTRAMUSCULAR | Status: AC
  Administered 2018-12-30: 1 mg via INTRAVENOUS
  Filled 2018-12-30: qty 1

## 2018-12-30 SURGICAL SUPPLY — 30 items
CANISTER SUCT 1200ML W/VALVE (MISCELLANEOUS) ×3 IMPLANT
COVER WAND RF STERILE (DRAPES) ×3 IMPLANT
DRAPE C-ARMOR (DRAPES) ×3 IMPLANT
DRAPE SHEET LG 3/4 BI-LAMINATE (DRAPES) ×3 IMPLANT
DRAPE U-SHAPE 47X51 STRL (DRAPES) IMPLANT
DRSG DERMACEA 8X12 NADH (GAUZE/BANDAGES/DRESSINGS) ×3 IMPLANT
DRSG OPSITE POSTOP 3X4 (GAUZE/BANDAGES/DRESSINGS) ×3 IMPLANT
DRSG OPSITE POSTOP 4X12 (GAUZE/BANDAGES/DRESSINGS) ×3 IMPLANT
DURAPREP 26ML APPLICATOR (WOUND CARE) ×3 IMPLANT
ELECT REM PT RETURN 9FT ADLT (ELECTROSURGICAL) ×3
ELECTRODE REM PT RTRN 9FT ADLT (ELECTROSURGICAL) ×1 IMPLANT
GLOVE BIOGEL M STRL SZ7.5 (GLOVE) ×3 IMPLANT
GLOVE INDICATOR 8.0 STRL GRN (GLOVE) ×3 IMPLANT
GOWN STRL REUS W/ TWL LRG LVL3 (GOWN DISPOSABLE) ×2 IMPLANT
GOWN STRL REUS W/TWL LRG LVL3 (GOWN DISPOSABLE) ×4
KIT TURNOVER CYSTO (KITS) ×3 IMPLANT
MAT ABSORB  FLUID 56X50 GRAY (MISCELLANEOUS) ×2
MAT ABSORB FLUID 56X50 GRAY (MISCELLANEOUS) ×1 IMPLANT
NS IRRIG 1000ML POUR BTL (IV SOLUTION) ×3 IMPLANT
PACK HIP COMPR (MISCELLANEOUS) ×3 IMPLANT
REAMER ROD DEEP FLUTE 2.5X950 (INSTRUMENTS) IMPLANT
SOL PREP PVP 2OZ (MISCELLANEOUS) ×3
SOLUTION PREP PVP 2OZ (MISCELLANEOUS) ×1 IMPLANT
STAPLER SKIN PROX 35W (STAPLE) ×3 IMPLANT
SUCTION FRAZIER HANDLE 10FR (MISCELLANEOUS) ×2
SUCTION TUBE FRAZIER 10FR DISP (MISCELLANEOUS) ×1 IMPLANT
SUT VIC AB 0 CT1 36 (SUTURE) ×3 IMPLANT
SUT VIC AB 1 CT1 36 (SUTURE) ×3 IMPLANT
SUT VIC AB 2-0 CT1 27 (SUTURE) ×2
SUT VIC AB 2-0 CT1 TAPERPNT 27 (SUTURE) ×1 IMPLANT

## 2018-12-30 NOTE — Progress Notes (Signed)
ANTICOAGULATION CONSULT NOTE  Pharmacy Consult for Heparin Drip Indication: mechanical Mitral Valve/A.fib  Allergies  Allergen Reactions  . Ace Inhibitors     Cough  . Doxycycline Nausea Only  . Hydrocodone Itching and Nausea Only  . Mercury     Other reaction(s): Unknown  . Silver Dermatitis    Severe itching  . Cephalexin Rash  . Clindamycin/Lincomycin Rash  . Nickel Rash  . Penicillins Rash    Has patient had a PCN reaction causing immediate rash, facial/tongue/throat swelling, SOB or lightheadedness with hypotension: YES Has patient had a PCN reaction causing severe rash involving mucus membranes or skin necrosis: NO Has patient had a PCN reaction that required hospitalization NO Has patient had a PCN reaction occurring within the last 10 years: NO If all of the above answers are "NO", then may proceed with Cephalosporin use.    Patient Measurements: Height: 4\' 11"  (149.9 cm) Weight: 153 lb 3.5 oz (69.5 kg) IBW/kg (Calculated) : 43.2 Heparin Dosing Weight: 57.9kg  Vital Signs: Temp: 97.6 F (36.4 C) (07/01 1600) Temp Source: Axillary (07/01 1600) BP: 124/46 (07/01 1600) Pulse Rate: 61 (07/01 1600)  Labs: Recent Labs    12/28/18 0008  12/29/18 2154 12/30/18 0121 12/30/18 0823 12/30/18 1306  HGB 6.1*   < >  --  8.2* 7.4* 7.3*  HCT 19.9*   < >  --  26.1* 24.4* 24.0*  PLT 170  --   --   --  122*  --   APTT  --   --  57*  --   --   --   LABPROT  --   --  15.1  --   --   --   INR  --   --  1.2  --   --   --   HEPARINUNFRC 0.18*  --   --   --  0.12*  --   CREATININE 2.51*   < > 1.82* 1.39* 1.17* 0.81   < > = values in this interval not displayed.    Estimated Creatinine Clearance: 46.2 mL/min (by C-G formula based on SCr of 0.81 mg/dL).   Medical History: Past Medical History:  Diagnosis Date  . Arthritis    "back, hands, knees" (04/10/2016)  . Chronic back pain   . Chronic combined systolic (congestive) and diastolic (congestive) heart failure (HCC)     a. EF 25% by echo in 08/2015 b. RHC in 08/2015 showed normal filling pressures; c. 10/2018 Echo (in setting of atrial tachycardia): EF 25-30%, impaired relaxation. Glob HK. RVSP 24mmHg. Mod dil RA/LA. Mech MV w/ nl fxn (mean grad 5mmHg). Mod TR.   Marland Kitchen Chronic kidney disease   . Compression fracture    "several; all in my back" (04/10/2016)  . GERD (gastroesophageal reflux disease)   . Hypertension   . Lymphedema   . Pacemaker 05/2017  . PAF (paroxysmal atrial fibrillation) ([place])    a. On amio & Coumadin - CHA2DS2VASc = 4.  . S/P MVR (mitral valve replacement)    a. MVR 1994 b. redo MVR in 08/2014 - on Coumadin  . Shortness of breath dyspnea     Medications:    Assessment: Closed right hip fracture secondary to mechanical fall  Coagulopathy was reversed with vitamin K and INR down to 1.2 on 6/28.-Patient on Coumadin prior to admission Paroxysmal atrial fibrillation and history of mechanical mitral valve; AV paced Rate controlled. Given history of mechanical mitral valve, patient was previously started on Heparin on 6/28 but  was stopped on 6/29 due to significant drop in hemoglobin(7.8>6.1). Patient has since received PRBC transfusion and Hgb is currently 7.2. Per Dr. Saunders Revel, it is safe to restart heparin drip and has placed the consult for Pharmacy to re-initiate   Goal of Therapy:  Heparin level 0.3-0.7 units/ml Monitor platelets  Plan: Patient initially refused lab work overnight. Anti-Xa level obtained with am. Anti-Xa level is low. Will increase rate to 1100 units/hr with no bolus. Patient should not receive heparin bolus due to clinical course including recent blood transfusions. Next anti-Xa level is at 1630.   Pharmacy will continue to monitor and adjust per consult.   Jamicah Anstead L, RPh 12/30/2018 4:28 PM

## 2018-12-30 NOTE — Progress Notes (Signed)
ANTICOAGULATION CONSULT NOTE  Pharmacy Consult for Heparin Drip Indication: mechanical Mitral Valve/A.fib  Allergies  Allergen Reactions  . Ace Inhibitors     Cough  . Doxycycline Nausea Only  . Hydrocodone Itching and Nausea Only  . Mercury     Other reaction(s): Unknown  . Silver Dermatitis    Severe itching  . Cephalexin Rash  . Clindamycin/Lincomycin Rash  . Nickel Rash  . Penicillins Rash    Has patient had a PCN reaction causing immediate rash, facial/tongue/throat swelling, SOB or lightheadedness with hypotension: YES Has patient had a PCN reaction causing severe rash involving mucus membranes or skin necrosis: NO Has patient had a PCN reaction that required hospitalization NO Has patient had a PCN reaction occurring within the last 10 years: NO If all of the above answers are "NO", then may proceed with Cephalosporin use.    Patient Measurements: Height: 4\' 11"  (149.9 cm) Weight: 153 lb 3.5 oz (69.5 kg) IBW/kg (Calculated) : 43.2 Heparin Dosing Weight: 57.9kg  Vital Signs: Temp: 98 F (36.7 C) (07/01 2000) Temp Source: Axillary (07/01 2000) BP: 147/59 (07/01 2230) Pulse Rate: 81 (07/01 2230)  Labs: Recent Labs    12/28/18 0008  12/29/18 2154  12/30/18 0823 12/30/18 1306 12/30/18 1633  HGB 6.1*   < >  --    < > 7.4* 7.3* 7.5*  HCT 19.9*   < >  --    < > 24.4* 24.0* 24.7*  PLT 170  --   --   --  122*  --   --   APTT  --   --  57*  --   --   --   --   LABPROT  --   --  15.1  --   --   --   --   INR  --   --  1.2  --   --   --   --   HEPARINUNFRC 0.18*  --   --   --  0.12*  --  0.26*  CREATININE 2.51*   < > 1.82*   < > 1.17* 0.81 0.73   < > = values in this interval not displayed.    Estimated Creatinine Clearance: 46.8 mL/min (by C-G formula based on SCr of 0.73 mg/dL).   Medical History: Past Medical History:  Diagnosis Date  . Arthritis    "back, hands, knees" (04/10/2016)  . Chronic back pain   . Chronic combined systolic (congestive) and  diastolic (congestive) heart failure (HCC)    a. EF 25% by echo in 08/2015 b. RHC in 08/2015 showed normal filling pressures; c. 10/2018 Echo (in setting of atrial tachycardia): EF 25-30%, impaired relaxation. Glob HK. RVSP 25mmHg. Mod dil RA/LA. Mech MV w/ nl fxn (mean grad 52mmHg). Mod TR.   Marland Kitchen Chronic kidney disease   . Compression fracture    "several; all in my back" (04/10/2016)  . GERD (gastroesophageal reflux disease)   . Hypertension   . Lymphedema   . Pacemaker 05/2017  . PAF (paroxysmal atrial fibrillation) ([place])    a. On amio & Coumadin - CHA2DS2VASc = 4.  . S/P MVR (mitral valve replacement)    a. MVR 1994 b. redo MVR in 08/2014 - on Coumadin  . Shortness of breath dyspnea     Medications:    Assessment: Closed right hip fracture secondary to mechanical fall  Coagulopathy was reversed with vitamin K and INR down to 1.2 on 6/28.-Patient on Coumadin prior to admission Paroxysmal atrial  fibrillation and history of mechanical mitral valve; AV paced Rate controlled. Given history of mechanical mitral valve, patient was previously started on Heparin on 6/28 but was stopped on 6/29 due to significant drop in hemoglobin(7.8>6.1). Patient has since received PRBC transfusion and Hgb is currently 7.2. Per Dr. Saunders Revel, it is safe to restart heparin drip and has placed the consult for Pharmacy to re-initiate   Goal of Therapy:  Heparin level 0.3-0.7 units/ml Monitor platelets  Plan: 07/01 @ 2230 patient is still refusing lab draws, spoke w/ RN and is not able to draw Anti-Xa from CRRT machine d/t causing an inaccurate result, and neither will the patient want the RN to stick her to draw the anti-Xa level. There is a standing order for anti-Xa scheduled for 07/02 @ 2230, but nothing this PM. Will continue to follow-up. Patient's H/h still in the mid to low range: 7.3/24 >> 7.5/24.7. Of note a CRRT cartridge was lost which resulted in a hgb of 6.8 one day ago. Will continue to  monitor.  Pharmacy will continue to monitor and adjust per consult.   Tobie Lords, PharmD, BCPS Clinical Pharmacist 12/30/2018 11:12 PM

## 2018-12-30 NOTE — Progress Notes (Signed)
[facility], [place] 12/30/18  Subjective:  Patient appears worse today. Received some sedation earlier this a.m. Much more lethargic today. Tolerating CRRT well so far.  Objective:  Vital signs in last 24 hours:  Temp:  [97.4 F (36.3 C)-98.7 F (37.1 C)] 98.3 F (36.8 C) (07/01 1200) Pulse Rate:  [53-104] 65 (07/01 1530) Resp:  [14-32] 21 (07/01 1530) BP: (66-212)/(34-158) 122/41 (07/01 1530) SpO2:  [86 %-100 %] 100 % (07/01 1530) FiO2 (%):  [50 %-100 %] 100 % (07/01 1530) Weight:  [69.5 kg] 69.5 kg (07/01 0500)  Weight change:  Filed Weights   12/25/18 2219 12/30/18 0500  Weight: 67.1 kg 69.5 kg    Intake/Output:    Intake/Output Summary (Last 24 hours) at 12/30/2018 1558 Last data filed at 12/30/2018 1500 Gross per 24 hour  Intake 455.4 ml  Output 2125 ml  Net -1669.6 ml     Physical Exam: General:  Frail elderly lady, critically ill-appearing  HEENT  moist oral mucous membranes  Neck  supple no JVD,  Pulm/lungs  bibasilar rales  CVS/Heart  irregular, 3/6 SEM  Abdomen:   Soft, nontender  Extremities:  2+ LE edema  Neurologic:  Lethargic  Skin:  Right leg ecchymosis, resolving hematoma    Basic Metabolic Panel:  Recent Labs  Lab 12/29/18 1833 12/29/18 2154 12/30/18 0121 12/30/18 0823 12/30/18 1306  NA 133* 137 135 136 138  K 3.9 3.9 4.0 4.0 4.2  CL 90* 95* 95* 99 104  CO2 31 32 30 28 32  GLUCOSE 121* 128* 116* 111* 109*  BUN 71* 65* 51* 38* 29*  CREATININE 2.14* 1.82* 1.39* 1.17* 0.81  CALCIUM 8.5* 8.6* 8.7* 8.4* 8.4*  MG 2.4 2.4 2.3 2.2 1.9  PHOS 3.6 3.0 2.6 2.6 2.3*     CBC: Recent Labs  Lab 12/25/18 2229 12/26/18 0719  12/27/18 0740 12/28/18 0008  12/29/18 1025 12/29/18 1833 12/30/18 0121 12/30/18 0823 12/30/18 1306  WBC 6.3 8.4  --  12.6* 11.1*  --   --   --   --  9.6  --   NEUTROABS 4.7  --   --   --   --   --   --   --   --   --   --   HGB 8.0* 5.9*   < > 7.3* 6.1*   < > 7.2* 6.8* 8.2* 7.4* 7.3*   HCT 27.2* 20.0*   < > 23.7* 19.9*   < > 22.7* 21.4* 26.1* 24.4* 24.0*  MCV 82.7 83.0  --  85.6 84.7  --   --   --   --  87.8  --   PLT 182 175  --  182 170  --   --   --   --  122*  --    < > = values in this interval not displayed.     No results found for: HEPBSAG, HEPBSAB, HEPBIGM    Microbiology:  Recent Results (from the past 240 hour(s))  SARS Coronavirus 2 (CEPHEID- Performed in [facility] hospital lab), Hosp Order     Status: None   Collection Time: 12/26/18 12:33 AM   Specimen: Nasopharyngeal Swab  Result Value Ref Range Status   SARS Coronavirus 2 NEGATIVE NEGATIVE Final    Comment: (NOTE) If result is NEGATIVE SARS-CoV-2 target nucleic acids are NOT DETECTED. The SARS-CoV-2 RNA is generally detectable in upper and lower  respiratory specimens during the acute phase of infection. The lowest  concentration  of SARS-CoV-2 viral copies this assay can detect is 250  copies / mL. A negative result does not preclude SARS-CoV-2 infection  and should not be used as the sole basis for treatment or other  patient management decisions.  A negative result may occur with  improper specimen collection / handling, submission of specimen other  than nasopharyngeal swab, presence of viral mutation(s) within the  areas targeted by this assay, and inadequate number of viral copies  (<250 copies / mL). A negative result must be combined with clinical  observations, patient history, and epidemiological information. If result is POSITIVE SARS-CoV-2 target nucleic acids are DETECTED. The SARS-CoV-2 RNA is generally detectable in upper and lower  respiratory specimens dur ing the acute phase of infection.  Positive  results are indicative of active infection with SARS-CoV-2.  Clinical  correlation with patient history and other diagnostic information is  necessary to determine patient infection status.  Positive results do  not rule out bacterial infection or co-infection with other  viruses. If result is PRESUMPTIVE POSTIVE SARS-CoV-2 nucleic acids MAY BE PRESENT.   A presumptive positive result was obtained on the submitted specimen  and confirmed on repeat testing.  While 2019 novel coronavirus  (SARS-CoV-2) nucleic acids may be present in the submitted sample  additional confirmatory testing may be necessary for epidemiological  and / or clinical management purposes  to differentiate between  SARS-CoV-2 and other Sarbecovirus currently known to infect humans.  If clinically indicated additional testing with an alternate test  methodology [phone]) is advised. The SARS-CoV-2 RNA is generally  detectable in upper and lower respiratory sp ecimens during the acute  phase of infection. The expected result is Negative. Fact Sheet for Patients:  StrictlyIdeas.no Fact Sheet for Healthcare Providers: BankingDealers.co.za This test is not yet approved or cleared by the [place] FDA and has been authorized for detection and/or diagnosis of SARS-CoV-2 by FDA under an Emergency Use Authorization (EUA).  This EUA will remain in effect (meaning this test can be used) for the duration of the COVID-19 declaration under Section 564(b)(1) of the Act, 21 U.S.C. section 360bbb-3(b)(1), unless the authorization is terminated or revoked sooner. Performed at [facility], [address] [place], [place] [address]   MRSA PCR Screening     Status: None   Collection Time: 12/26/18  1:54 AM   Specimen: Nasal Mucosa; Nasopharyngeal  Result Value Ref Range Status   MRSA by PCR NEGATIVE NEGATIVE Final    Comment:        The GeneXpert MRSA Assay (FDA approved for NASAL specimens only), is one component of a comprehensive MRSA colonization surveillance program. It is not intended to diagnose MRSA infection nor to guide or monitor treatment for MRSA infections. Performed at [facility], [address]  [place], [place] [address]   CULTURE, BLOOD (ROUTINE X 2) w Reflex to ID Panel     Status: None (Preliminary result)   Collection Time: 12/28/18  3:09 PM   Specimen: BLOOD  Result Value Ref Range Status   Specimen Description BLOOD RIGHT ANTECUBITAL  Final   Special Requests   Final    BOTTLES DRAWN AEROBIC AND ANAEROBIC Blood Culture adequate volume   Culture   Final    NO GROWTH 2 DAYS Performed at [facility], [address] [place], [place] [address]    Report Status PENDING  Incomplete  CULTURE, BLOOD (ROUTINE X 2) w Reflex to ID Panel     Status: None (  Preliminary result)   Collection Time: 12/28/18  3:19 PM   Specimen: BLOOD  Result Value Ref Range Status   Specimen Description BLOOD BLOOD RIGHT HAND  Final   Special Requests   Final    BOTTLES DRAWN AEROBIC AND ANAEROBIC Blood Culture adequate volume   Culture   Final    NO GROWTH 2 DAYS Performed at [facility], [address] [place], [place] [address]    Report Status PENDING  Incomplete    Coagulation Studies: Recent Labs    12/29/18 2152-10-13  LABPROT 15.1  INR 1.2    Urinalysis: Recent Labs    12/27/18 1816  COLORURINE YELLOW*  LABSPEC 1.016  PHURINE 5.0  GLUCOSEU NEGATIVE  HGBUR NEGATIVE  BILIRUBINUR NEGATIVE  KETONESUR NEGATIVE  PROTEINUR 30*  NITRITE NEGATIVE  LEUKOCYTESUR NEGATIVE      Imaging: No results found.   Medications:   . heparin 1,100 Units/hr (12/30/18 1500)  . norepinephrine (LEVOPHED) Adult infusion 6 mcg/min (12/30/18 1500)  . pureflow 2,500 mL/hr at 12/30/18 1021   . sodium chloride   Intravenous Once  . sodium chloride   Intravenous Once  . amiodarone  200 mg Oral Daily  . Chlorhexidine Gluconate Cloth  6 each Topical Daily  . cholecalciferol  2,000 Units Oral Daily  . digoxin  0.125 mg Intravenous Daily  . feeding supplement (ENSURE ENLIVE)  237 mL Oral BID BM  . ipratropium-albuterol  3 mL Nebulization TID  . midodrine  5 mg Oral TID WC  .  multivitamin-lutein  1 capsule Oral Daily  . polyethylene glycol  17 g Oral Daily  . vitamin C  250 mg Oral BID   acetaminophen **OR** acetaminophen, albuterol, heparin, ondansetron **OR** ondansetron (ZOFRAN) IV, traMADol, traZODone  Assessment/ Plan:  84 y.o.Caucasian  female with congestive heart failure EF 25%, chronic kidney disease, hypertension, atrial fibrillation, lymphedema, mitral valve replacement (mechanical valve) requiring chronic anticoagulation, COPD, osteoporosis, history of multiple lumbar compression fractures requiring kyphoplasty, history of left leg hematoma with evacuation and porcine allograft, was admitted on 12/25/2018 with right hip fracture.   1.  Acute kidney injury, chronic kidney disease stage III.  Baseline 1.37/GFR 36 from December 15, 2018 2.  Chronic systolic congestive heart failure with EF 25% 3.  Lower extremity edema with third spacing 4.  Pulmonary hypertension 5.  Mechanical heart valve requiring chronic anticoagulation  -Urine output did temporarily increase yesterday to 1.2 L however worse today with only 130 cc of urine output thus far.  Patient was transitioned to CRRT for fluid removal.  Appears to be tolerating well thus far.  Continue ultrafiltration target of 50 cc/h.  Stil very high risk for surgery at the moment.   LOS: 5 Rishit Burkhalter 7/1/20203:58 PM  [place], [place]  Note: This note was prepared with Dragon dictation. Any transcription errors are unintentional

## 2018-12-30 NOTE — Progress Notes (Signed)
As night has progressed, pt has become very verbally aggressive.  Pt has not slept at all and is confused.  Trazodone was ordered earlier to help pt finally get some rest but pt refused to take it.

## 2018-12-30 NOTE — Progress Notes (Signed)
CRITICAL CARE NOTE        SUBJECTIVE FINDINGS & SIGNIFICANT EVENTS   Patient remains critically ill, prognosis is guarded.  -Has tolerated CVVH since 2100 yesterday, and is now 1L negative in terms of fluid balance from yesterday, requiring intermittent Levophed during renal replacement therapy.    -Have discussed poor prognosis with family (both son Helene Kelp and daughter [name]) and highlighted that we have now exhausted all medically available management options, and if we are not seeing improvement within the next 24 hours will need to initiate de-escalation.  They are agreeable.  -Anesthesia and surgical plan on hold for now until clinically improved.  -Currently on heparin gtt, mild serosanguineous oozing from right hip hematoma, small decrement in hemoglobin due to circuit exchange on CVVH machine.  PAST MEDICAL HISTORY   Past Medical History:  Diagnosis Date   Arthritis    "back, hands, knees" (04/10/2016)   Chronic back pain    Chronic combined systolic (congestive) and diastolic (congestive) heart failure (HCC)    a. EF 25% by echo in 08/2015 b. RHC in 08/2015 showed normal filling pressures; c. 10/2018 Echo (in setting of atrial tachycardia): EF 25-30%, impaired relaxation. Glob HK. RVSP 7mmHg. Mod dil RA/LA. Mech MV w/ nl fxn (mean grad 19mmHg). Mod TR.    Chronic kidney disease    Compression fracture    "several; all in my back" (04/10/2016)   GERD (gastroesophageal reflux disease)    Hypertension    Lymphedema    Pacemaker 05/2017   PAF (paroxysmal atrial fibrillation) ([place])    a. On amio & Coumadin - CHA2DS2VASc = 4.   S/P MVR (mitral valve replacement)    a. MVR 1994 b. redo MVR in 08/2014 - on Coumadin   Shortness of breath dyspnea      SURGICAL HISTORY   Past Surgical  History:  Procedure Laterality Date   BREAST LUMPECTOMY Right 2005?   benign lump excision   CARDIAC CATHETERIZATION     CARDIAC VALVE REPLACEMENT  1994, 2016   "MVR; MVR"   CARDIOVERSION N/A 11/06/2018   Procedure: CARDIOVERSION;  Surgeon: Minna Merritts, MD;  Location: [facility];  Service: Cardiovascular;  Laterality: N/A;   CATARACT EXTRACTION W/PHACO Right 02/20/2015   Procedure: CATARACT EXTRACTION PHACO AND INTRAOCULAR LENS PLACEMENT ([place]);  Surgeon: Estill Cotta, MD;  Location: [facility];  Service: Ophthalmology;  Laterality: Right;  US01:31.2 AP 25.3% CDE40.13 Fluid lot # [id number]   DRESSING CHANGE UNDER ANESTHESIA Left 11/17/2018   Procedure: DRESSING CHANGE;  Surgeon: Robert Bellow, MD;  Location: [facility];  Service: General;  Laterality: Left;  no anesthesia   ELECTROPHYSIOLOGIC STUDY N/A 10/09/2015   Procedure: CARDIOVERSION;  Surgeon: Wellington Hampshire, MD;  Location: [facility];  Service: Cardiovascular;  Laterality: N/A;   INCISION AND DRAINAGE OF WOUND Left 11/13/2018   Procedure: LEFT LEG DRESSING CHANGE;  Surgeon: Robert Bellow, MD;  Location: [facility];  Service: General;  Laterality: Left;   INCISION AND DRAINAGE OF WOUND Left 11/19/2018   Procedure: LEFT LEG DRESSING CHANGE AND GRAFT APPLICATION;  Surgeon: Robert Bellow, MD;  Location: [facility];  Service: General;  Laterality: Left;   IR GENERIC HISTORICAL  04/01/2016   IR RADIOLOGIST EVAL & MGMT 04/01/2016 [facility]   IR GENERIC HISTORICAL  04/15/2016   IR VERTEBROPLASTY CERV/THOR BX INC UNI/BIL INC/INJECT/IMAGING 04/15/2016 Luanne Bras, MD [facility]   IR GENERIC HISTORICAL  04/15/2016   IR VERTEBROPLASTY EA ADDL (T&LS) BX INC UNI/BIL  INC INJECT/IMAGING 04/15/2016 Luanne Bras, MD [facility]   IR GENERIC HISTORICAL  04/15/2016   IR VERTEBROPLASTY EA ADDL (T&LS) BX INC UNI/BIL INC INJECT/IMAGING 04/15/2016 Luanne Bras, MD [facility]   IR GENERIC HISTORICAL   05/20/2016   IR RADIOLOGIST EVAL & MGMT 05/20/2016 [facility]   IR GENERIC HISTORICAL  06/13/2016   IR VERTEBROPLASTY EA ADDL (T&LS) BX INC UNI/BIL INC INJECT/IMAGING 06/13/2016 Luanne Bras, MD [facility]   IR GENERIC HISTORICAL  06/13/2016   IR SACROPLASTY BILATERAL 06/13/2016 Luanne Bras, MD [facility]   IR GENERIC HISTORICAL  06/13/2016   IR VERTEBROPLASTY LUMBAR BX INC UNI/BIL INC/INJECT/IMAGING 06/13/2016 Luanne Bras, MD [facility]   IRRIGATION AND DEBRIDEMENT HEMATOMA Left 07/05/2015   Procedure: IRRIGATION AND DEBRIDEMENT HEMATOMA;  Surgeon: Robert Bellow, MD;  Location: [facility];  Service: General;  Laterality: Left;   IRRIGATION AND DEBRIDEMENT HEMATOMA Left 11/11/2018   Procedure: IRRIGATION AND DEBRIDEMENT LEFT LEG HEMATOMA;  Surgeon: Robert Bellow, MD;  Location: [facility];  Service: General;  Laterality: Left;   pyloric stenosis  07/1937   US ECHOCARDIOGRAPHY       FAMILY HISTORY   Family History  Problem Relation Age of Onset   Stroke Mother    Renal Disease Father      SOCIAL HISTORY   Social History   Tobacco Use   Smoking status: Former Smoker    Packs/day: 1.00    Years: 27.00    Pack years: 27.00    Types: Cigarettes    Quit date: 07/01/1980    Years since quitting: 38.5   Smokeless tobacco: Never Used   Tobacco comment: "quit smoking ~ 1980  Substance Use Topics   Alcohol use: Not Currently    Alcohol/week: 0.0 standard drinks    Comment: 04/10/2016 "I'll have a drink on holidays/special occasions"   Drug use: No     MEDICATIONS   Current Medication:  Current Facility-Administered Medications:    0.9 %  sodium chloride infusion (Manually program via Guardrails IV Fluids), , Intravenous, Once, Ojie, Jude, MD   0.9 %  sodium chloride infusion (Manually program via Guardrails IV Fluids), , Intravenous, Once, Ottie Glazier, MD   acetaminophen (TYLENOL) tablet 650 mg, 650 mg, Oral, Q6H PRN, 650  mg at 12/29/18 2216 **OR** acetaminophen (TYLENOL) suppository 650 mg, 650 mg, Rectal, Q6H PRN, Lance Coon, MD   albuterol (PROVENTIL) (2.5 MG/3ML) 0.083% nebulizer solution 2.5 mg, 2.5 mg, Nebulization, Q2H PRN, Ottie Glazier, MD   amiodarone (PACERONE) tablet 200 mg, 200 mg, Oral, Daily, Ojie, Jude, MD, 200 mg at 12/29/18 0806   Chlorhexidine Gluconate Cloth 2 % PADS 6 each, 6 each, Topical, Daily, Ottie Glazier, MD, 6 each at 12/30/18 0730   cholecalciferol (VITAMIN D3) tablet 2,000 Units, 2,000 Units, Oral, Daily, Ojie, Jude, MD, 2,000 Units at 12/29/18 0806   feeding supplement (ENSURE ENLIVE) (ENSURE ENLIVE) liquid 237 mL, 237 mL, Oral, BID BM, Ojie, Jude, MD, 237 mL at 12/28/18 1543   heparin ADULT infusion 100 units/mL (25000 units/245mL sodium chloride 0.45%), 850 Units/hr, Intravenous, Continuous, Nazari, Walid A, RPH, Last Rate: 8.5 mL/hr at 12/30/18 0800, 850 Units/hr at 12/30/18 0800   heparin injection 1,000-6,000 Units, 1,000-6,000 Units, CRRT, PRN, Lateef, Munsoor, MD   ipratropium-albuterol (DUONEB) 0.5-2.5 (3) MG/3ML nebulizer solution 3 mL, 3 mL, Nebulization, TID, Lanney Gins, Jakeob Tullis, MD, 3 mL at 12/29/18 1925   midodrine (PROAMATINE) tablet 5 mg, 5 mg, Oral, TID WC, Lanney Gins, Nason Conradt, MD, 5 mg at 12/29/18 1232  multivitamin-lutein (OCUVITE-LUTEIN) capsule 1 capsule, 1 capsule, Oral, Daily, Ojie, Jude, MD, 1 capsule at 12/29/18 0807   norepinephrine (LEVOPHED) 4mg  in 249mL premix infusion, 0-40 mcg/min, Intravenous, Titrated, Darel Hong D, NP, Stopped at 12/30/18 0414   ondansetron (ZOFRAN) tablet 4 mg, 4 mg, Oral, Q6H PRN **OR** ondansetron (ZOFRAN) injection 4 mg, 4 mg, Intravenous, Q6H PRN, Lance Coon, MD   polyethylene glycol (MIRALAX / GLYCOLAX) packet 17 g, 17 g, Oral, Daily, Ojie, Jude, MD   pureflow IV solution for Dialysis, , CRRT, Continuous, Lateef, Munsoor, MD, Last Rate: 2,500 mL/hr at 12/30/18 0248   traMADol (ULTRAM) tablet 50 mg, 50 mg,  Oral, Q12H PRN, Stark Jock, Jude, MD   traZODone (DESYREL) tablet 50 mg, 50 mg, Oral, QHS PRN, Darel Hong D, NP   vitamin C (ASCORBIC ACID) tablet 250 mg, 250 mg, Oral, BID, Ojie, Jude, MD, 250 mg at 12/29/18 2202    ALLERGIES   Ace inhibitors, Doxycycline, Hydrocodone, Mercury, Silver, Cephalexin, Clindamycin/lincomycin, Nickel, and Penicillins    REVIEW OF SYSTEMS     10 point ROS done and is negative except for R hip pain and mild aggitation.   PHYSICAL EXAMINATION   Vitals:   12/30/18 0730 12/30/18 0800  BP: (!) 87/70 (!) 86/42  Pulse: (!) 104 73  Resp: 19 (!) 23  Temp:  98.1 F (36.7 C)  SpO2: 100% 100%    GENERAL:Mild distress due to restlessness HEAD: Normocephalic, atraumatic.  EYES: Pupils equal, round, reactive to light.  No scleral icterus.  MOUTH: Moist mucosal membrane. NECK: Supple. No thyromegaly. No nodules. No JVD.  PULMONARY:crackles at bases bilaterally CARDIOVASCULAR: S1 and S2. Regular rate and rhythm. No murmurs, rubs, or gallops.  GASTROINTESTINAL: Soft, nontender, non-distended. No masses. Positive bowel sounds. No hepatosplenomegaly.  MUSCULOSKELETAL: Lower extremity edema with previous left extremity hematoma and now right hip fracture with serosanguineous oozing from [facility] on the right NEUROLOGIC: Mild distress due to acute illness SKIN:intact,warm,dry   LABS AND IMAGING       LAB RESULTS: Recent Labs  Lab 12/29/18 2154 12/30/18 0121 12/30/18 0823  NA 137 135 136  K 3.9 4.0 4.0  CL 95* 95* 99  CO2 32 30 28  BUN 65* 51* 38*  CREATININE 1.82* 1.39* 1.17*  GLUCOSE 128* 116* 111*   Recent Labs  Lab 12/27/18 0740 12/28/18 0008  12/29/18 1833 12/30/18 0121 12/30/18 0823  HGB 7.3* 6.1*   < > 6.8* 8.2* 7.4*  HCT 23.7* 19.9*   < > 21.4* 26.1* 24.4*  WBC 12.6* 11.1*  --   --   --  9.6  PLT 182 170  --   --   --  122*   < > = values in this interval not displayed.     IMAGING RESULTS: No results found.    ASSESSMENT AND  PLAN    -Multidisciplinary rounds held today  Acute on chronic hypoxemic respiratory failure -Due tochronicsystolic CHF with EF 25 to 30% -Cardiology on case appreciate input-follow recommendations -Currently being diuresed by nephrology  -Complicated byacute blood loss anemia, pulmonary edema, CKD, atelectasis, deconditioning, R hip fracture    Chronic HFrEF with EF 16-10%  -Complicated by AF and mechMV requiring AC - discussed with cardiology Dr End - concern for high risk for acute CVA without AC - on Heparin gtt tolerating acceptably   Acute blood loss anemia   - due to R hip fracture and hematoma formation    - s/p 3 units prbc transfusion     -  h/h borderline low, q4h h/h monitoring , transfuse as needed    Rt hip fracture      -discussed case and care plan with  Dr Marry Guan and anesthesiology Dr Janett Billow - appreciate input    - high risk patient holding plan for surgery until further eval    Pulmonary pre-operative evaluation  ARISCAT ([place]) preoperative pulmonary risk index in adults- High risk - 42.1% chance of any pulmonary complication postoperatively           -Mainly due to advanced age, baseline chronic hypoxemia, preoperative anemia, significant comorbid conditions  [place] respiratory failure index - moderate-high risk risk- 10.1% chance of post operative respiratory failure requiring MV >48h  GUPTA calculator for postoperative respiratory failure - moderate risk - 4.03% chance of postoperative respiratory failure requiring mechanical ventilation over 48 hours   Acute on chronic renal insufficiency -Renal function improved post CVVH -Likely cardiorenal in origin   Bilateral atelectasis -Contributing to hypoxemia - chest PT - MetaNeb BID - Incentive spirometer to be used each hour multiple times - please encourage to use  - patient has difficulty expectorating phlegm - will ask RT to bring Acapella device as well    Moderate protein  calorie malnutrition and deconditioning -Contributing to dyspnea -Patient working with physical therapy was able to get out of bed and slowly walk today but encouraged to continue this as well as outpatient -Nutritional assessment patient has been aspirating and has been evaluated with modified barium swallow recommended aspiration precautions-s/p dietician evaluation - appreciate input      GI/Nutrition GI PROPHYLAXIS as indicated DIET-->TF's as tolerated Constipation protocol as indicated  ENDO - ICU hypoglycemic\Hyperglycemia protocol -check FSBS per protocol   ELECTROLYTES -follow labs as needed -replace as needed -pharmacy consultation   DVT/GI PRX ordered -SCDs  TRANSFUSIONS AS NEEDED MONITOR FSBS ASSESS the need for LABS as needed   Critical care provider statement:    Critical care time (minutes):  112   Critical care time was exclusive of:  Separately billable procedures and treating other patients   Critical care was necessary to treat or prevent imminent or life-threatening deterioration of the following conditions:  acute hypoxemic respiratory failure, AKI, acute blood loss anemia, Rt hip fracture, multiple comorbid conditoins   Critical care was time spent personally by me on the following activities:  Development of treatment plan with patient or surrogate, discussions with consultants, evaluation of patient's response to treatment, examination of patient, obtaining history from patient or surrogate, ordering and performing treatments and interventions, ordering and review of laboratory studies and re-evaluation of patient's condition.  I assumed direction of critical care for this patient from another provider in my specialty: no    This document was prepared using Dragon voice recognition software and may include unintentional dictation errors.    Ottie Glazier, M.D.  Division of [place]

## 2018-12-30 NOTE — Progress Notes (Signed)
CRRT Medication Adjustments:    Pharmacy consulted for medication adjustments for 84 yo female initiated on CRRT on 6/30. No adjustments warranted at this time.   Pharmacy will continue to monitor and adjust per consult.   Simpson,[name] L,  12/30/2018 4:31 PM

## 2018-12-30 NOTE — Progress Notes (Signed)
Pt is refusing all lab draws, and will not allow staff to draw labs from her Trialysis line.  I have explained to her that follow up labs are needed to assess the CRRT, assess for further bleeding, and for titration of her Heparin. I also discussed with her that given she has the Trialysis line, we do not actually stick her for the blood.  Despite discussing this, she continues to refuse.  She is agreeable to discussing this with her family when they arrive first thing this morning.  Continue to encourage compliance, and will acquire family's assistance when they arrive.      Darel Hong, AGACNP-BC [facility] Pager: [phone] Cell: [phone]

## 2018-12-30 NOTE — Progress Notes (Signed)
Daily Progress Note   Patient Name: [name]       Date: 12/30/2018 DOB: [birth year]  Age: 84 y.o. MRN#: [id number] Attending Physician: Otila Back, MD Primary Care Physician: Rusty Aus, MD Admit Date: 12/25/2018  Reason for Consultation/Follow-up: Establishing goals of care  Subjective: Patient is resting in bed on a non-rebreather, CRRT in place, with a fixed gaze on the ceiling. She appears to be very frail. Daughter and son at bedside with lights off and daughter with blanket. Son sitting at bedside with his mother and continued to focus on her during my visit. Daughter states [name] did not sleep well last night. Stated that the team is worried about her and she stated "yea, but it's okay." States she does not need anything at this time.     Length of Stay: 5  Current Medications: Scheduled Meds:  . sodium chloride   Intravenous Once  . sodium chloride   Intravenous Once  . amiodarone  200 mg Oral Daily  . Chlorhexidine Gluconate Cloth  6 each Topical Daily  . cholecalciferol  2,000 Units Oral Daily  . digoxin  0.125 mg Intravenous Daily  . feeding supplement (ENSURE ENLIVE)  237 mL Oral BID BM  . ipratropium-albuterol  3 mL Nebulization TID  . midodrine  5 mg Oral TID WC  . multivitamin-lutein  1 capsule Oral Daily  . polyethylene glycol  17 g Oral Daily  . vitamin C  250 mg Oral BID    Continuous Infusions: . heparin 1,100 Units/hr (12/30/18 1100)  . norepinephrine (LEVOPHED) Adult infusion 1 mcg/min (12/30/18 1100)  . pureflow 2,500 mL/hr at 12/30/18 1021    PRN Meds: acetaminophen **OR** acetaminophen, albuterol, heparin, ondansetron **OR** ondansetron (ZOFRAN) IV, traMADol, traZODone  Physical Exam Constitutional:      Comments: Fixed gaze on the  ceiling.   Pulmonary:     Comments: On non-rebreather.             Vital Signs: BP (!) 90/45   Pulse 70   Temp 98.1 F (36.7 C) (Axillary)   Resp (!) 22   Ht 4\' 11"  (1.499 m)   Wt 69.5 kg   SpO2 100%   BMI 30.95 kg/m  SpO2: SpO2: 100 % O2 Device: O2 Device: NRB O2 Flow Rate: O2 Flow Rate (L/min):  15 L/min  Intake/output summary:   Intake/Output Summary (Last 24 hours) at 12/30/2018 1137 Last data filed at 12/30/2018 1100 Gross per 24 hour  Intake 354.62 ml  Output 1932 ml  Net -1577.38 ml   LBM: Last BM Date: (unknown, none charted since admit on 6/26) Baseline Weight: Weight: 67.1 kg Most recent weight: Weight: 69.5 kg       Palliative Assessment/Data:      Patient Active Problem List   Diagnosis Date Noted  . GERD (gastroesophageal reflux disease) 12/25/2018  . Closed right hip fracture ([place]) 12/25/2018  . Hip fracture ([place]) 12/25/2018  . Goals of care, counseling/discussion   . Palliative care by specialist   . Wound of left leg   . Persistent atrial fibrillation   . Atrial tachycardia ([place])   . Chronic kidney disease (CKD), stage IV (severe) ([place])   . Chest pain of uncertain etiology   . Traumatic hematoma of left lower leg 11/09/2018  . Acute exacerbation of CHF (congestive heart failure) ([place]) 11/04/2018  . Pulmonary hypertension, unspecified ([place]) 11/04/2018  . S/P MVR (mitral valve replacement) 11/04/2018  . Encounter for therapeutic drug monitoring 09/09/2017  . Encounter for anticoagulation discussion and counseling 09/09/2017  . PAF (paroxysmal atrial fibrillation) ([place]) 02/25/2017  . Chronic venous insufficiency 10/27/2016  . Shortness of breath 08/14/2016  . Pressure injury of skin 04/12/2016  . Malnutrition of moderate degree 04/11/2016  . Vertebral compression fracture ([place]) 04/10/2016  . Intractable pain 04/10/2016  . Spinal compression fracture ([place]) 04/10/2016  . Acute on chronic systolic heart failure ([place]) 11/19/2015  . Lymphedema    . Bilateral lower extremity edema 08/30/2015  . Acute on chronic renal failure ([place]) 04/28/2015  . Hyponatremia 04/28/2015  . History of mitral valve replacement with mechanical valve 11/04/2014  . Long term current use of anticoagulant therapy 09/28/2014  . Chronic combined systolic and diastolic CHF  16/04/9603  . 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 Generalized OA 11/28/2013  . HLD (hyperlipidemia) 11/28/2013    Palliative Care Assessment & Plan    Recommendations/Plan:  Continue care per specialty conversations.     Code Status:    Code Status Orders  (From admission, onward)         Start     Ordered   12/26/18 0148  Full code  Continuous     12/26/18 0147        Code Status History    Date Active Date Inactive Code Status Order ID Comments User Context   12/04/2018 1937 12/15/2018 1643 Full Code [id number]  Mayer Camel, NP Inpatient   11/24/2018 1621 11/30/2018 2027 Full Code [id number]  Fritzi Mandes, MD Inpatient   11/24/2018 1557 11/24/2018 1620 DNR [id number]  Fritzi Mandes, MD Inpatient   11/09/2018 0333 11/24/2018 1557 Full Code [id number]  Mayer Camel, NP ED   11/04/2018 1650 11/08/2018 2006 Full Code [id number]  Saundra Shelling, MD Inpatient   04/10/2016 2034 04/19/2016 1757 Full Code [id number]  Vianne Bulls, MD Inpatient   09/29/2015 1053 10/10/2015 1406 Full Code [id number]  [name], MD Inpatient   04/28/2015 1626 04/30/2015 1422 Full Code [id number]  Demetrios Loll, MD Inpatient   Advance Care Planning Activity       Prognosis:  Very poor.   Discharge Planning:  To Be Determined  Care plan was discussed with RN  Thank you for allowing the Palliative Medicine Team to assist in the care of this patient.   Total Time 15 min Prolonged  Time Billed  no      Greater than 50%  of this time was spent counseling and coordinating care related to the above assessment and plan.  Asencion Gowda, NP  Please contact Palliative Medicine  Team phone at [phone] for questions and concerns.

## 2018-12-30 NOTE — Consult Note (Addendum)
I spoke with the patients daughter and ICU attending today.  I explained that the patient would possibley receive more appropriate care at a tertiary facility that has acces to intraoperative TEE, PA Catheter and Nitric oxide.  That we would want the patient to not be in multi system organ failure as we feel that the surgery and our anesthetic would only exacerbate her comorbidities at this time.  At a minimum we would want the patient off of pressors, tolerating intermittent hemodialysis and to not be in acute respiratory failure to proceed with her hip surgery. They voiced understanding.
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
## 2018-12-30 NOTE — Progress Notes (Signed)
[facility] [place] NAME: [name]    MR#:  [id number]  DATE OF BIRTH:  February 05, 1935  SUBJECTIVE:  CHIEF COMPLAINT:   Chief Complaint  Patient presents with   Fall   Hip Pain   Patient more confused this morning.  Son at bedside.  Notified that patient had to be placed back on Levophed drip.   REVIEW OF SYSTEMS:  Unobtainable.  Patient pleasantly confused.  DRUG ALLERGIES:   Allergies  Allergen Reactions   Ace Inhibitors     Cough   Doxycycline Nausea Only   Hydrocodone Itching and Nausea Only   Mercury     Other reaction(s): Unknown   Silver Dermatitis    Severe itching   Cephalexin Rash   Clindamycin/Lincomycin Rash   Nickel Rash   Penicillins Rash    Has patient had a PCN reaction causing immediate rash, facial/tongue/throat swelling, SOB or lightheadedness with hypotension: YES Has patient had a PCN reaction causing severe rash involving mucus membranes or skin necrosis: NO Has patient had a PCN reaction that required hospitalization NO Has patient had a PCN reaction occurring within the last 10 years: NO If all of the above answers are "NO", then may proceed with Cephalosporin use.   VITALS:  Blood pressure (!) 101/35, pulse 61, temperature 98.3 F (36.8 C), temperature source Oral, resp. rate (!) 25, height 4\' 11"  (1.499 m), weight 69.5 kg, SpO2 100 %. PHYSICAL EXAMINATION:   Physical Exam  Constitutional: She is well-developed, well-nourished, and in no distress.  HENT:  Head: Normocephalic and atraumatic.  Right Ear: External ear normal.  Eyes: Pupils are equal, round, and reactive to light. Conjunctivae are normal. Right eye exhibits no discharge.  Neck: Normal range of motion. Neck supple. No thyromegaly present.  Cardiovascular: Normal rate.  Irregularly irregular  Pulmonary/Chest: Effort normal and breath sounds normal. No respiratory distress.  Abdominal: Soft. Bowel sounds are normal.  She exhibits no distension.  Musculoskeletal:        General: No edema.     Comments: Slight discomfort and ecchymosis around right hip fracture site  Neurological:  Pleasantly confused  Skin: Skin is warm and dry. She is not diaphoretic. No erythema.  Psychiatric: Affect and judgment normal.   LABORATORY PANEL:  Female CBC Recent Labs  Lab 12/30/18 0823 12/30/18 1306  WBC 9.6  --   HGB 7.4* 7.3*  HCT 24.4* 24.0*  PLT 122*  --    ------------------------------------------------------------------------------------------------------------------ Chemistries  Recent Labs  Lab 12/25/18 2229  12/30/18 1306  NA 138   < > 138  K 4.1   < > 4.2  CL 96*   < > 104  CO2 31   < > 32  GLUCOSE 139*   < > 109*  BUN 50*   < > 29*  CREATININE 1.65*   < > 0.81  CALCIUM 9.4   < > 8.4*  MG  --    < > 1.9  AST 24  --   --   ALT 14  --   --   ALKPHOS 107  --   --   BILITOT 0.8  --   --    < > = values in this interval not displayed.   RADIOLOGY:  No results found. ASSESSMENT AND PLAN:     1. Closed right hip fracture secondary to mechanical fall  Patient appears to have some swelling around her right hip. Patient seen by orthopedic service. Coagulopathy  was reversed with vitamin K and INR down to 1.2. Patient seen by cardiologist.  Patient is chronically very high risk for any surgical procedures.  No further cardiovascular risk assessment will be beneficial however.  Patient transfused with 2 units of packed red blood cells previously  and hemoglobin improved to 7.3. Patient became hypoxic and hypotensive and had to be transferred to ICU Surgery currently on hold until patient hemodynamically stable.  2.  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  Seen by nephrologist and patient started on Lasix drip low dose due to blood pressure being borderline.  Midodrine was also added to help maintain blood pressure  while ongoing diuresis. Patient also required Levophed drip to maintain blood pressure.  3.  Paroxysmal atrial fibrillation and history of mechanical mitral valve; AV paced Rate controlled.  Resumed amiodarone.  Blood pressure being borderline, limiting resumption of some of patient's home medications. Heparin drip discontinued recently due to further drop in hemoglobin requiring more packed red blood cell transfusions.  Hemoglobin stable at 7.3 this morning. Will defer resumption of anticoagulation to cardiology service.  4.  Acute on chronic hypoxic respiratory failure Patient had to be transferred to ICU recently due to hypoxia requiring up to 6 L of oxygen via nasal cannula.  Patient uses 3 L of oxygen at home at baseline.  5.  Acute blood loss anemia Hemoglobin stable at 7.3 this morning following packed red blood cell transfusion  6.  Acute kidney injury superimposed on chronic kidney disease stage III Nephrology already following and assisting with management. Patient started on CRRT  7.  Left lower extremity chronic wounds. Being managed by surgical service.  DVT prophylaxis; heparin drip discontinued due to anemia.  No SCDs due to lower extremity wounds  Patient seen by palliative care team.  Patient now DNR. CODE STATUS: DNR  TOTAL TIME TAKING CARE OF THIS PATIENT: 37 minutes.   More than 50% of the time was spent in counseling/coordination of care: YES  POSSIBLE D/C IN 2-3 DAYS, DEPENDING ON CLINICAL CONDITION.   Braxten Memmer M.D on 12/30/2018 at 2:14 PM  Between 7am to 6pm - Pager - [phone]  After 6pm go to [email or web address] - Proofreader  [facility]  Office  [phone]  CC: Primary care physician; Rusty Aus, MD  Note: This dictation was prepared with Dragon dictation along with smaller phrase technology. Any transcriptional errors that result from this process are unintentional.

## 2018-12-30 NOTE — Progress Notes (Signed)
Pt is refusing all lab draws, even from the central line.  Darlyn Chamber, NP aware and spoke with the pt himself. Will pass in report to day shift rn in hopes that when family arrives they can convince her to have the labs drawn.

## 2018-12-30 NOTE — Progress Notes (Signed)
Nutrition Follow-up  DOCUMENTATION CODES:   Not applicable  INTERVENTION:  Patient unable to take PO at this time. Will continue to monitor for outcome of discussions regarding goals of care and for plan of care.  NUTRITION DIAGNOSIS:   Increased nutrient needs related to wound healing as evidenced by estimated needs.  Ongoing.  GOAL:   Patient will meet greater than or equal to 90% of their needs  Not met.  MONITOR:   PO intake, Supplement acceptance, Labs, Weight trends, Skin, I & O's  REASON FOR ASSESSMENT:   Consult Assessment of nutrition requirement/status, Wound healing  ASSESSMENT:   84 year old female with PMHx of HTN, chronic combined systolic and diastolic heart failure (LV EF 25-30% 11/05/2018), GERD, hx mitral valve replacement with mechanical valve in 2016 on Coumadin, arthritis, chronic back pain, CKD IV, lymphedema, A-fib s/p pacemaker placement in 2018,  Left lower extremity wound/hematoma s/p debridement and ACell placement admitted with closed right hip fracture secondary to mechanical fall.  Patient is now on CVVHD. Hip surgery is being postponed. Patient is now too lethargic for PO intake today. Poor prognosis per discussion on rounds. Family is discussing goals of care with team.  Medications reviewed and include: amiodarone, vitamin D3 2000 units daily, midodrine, Ocuvite daily, Miralax 17 grams daily, vitamin C 250 mg BID, heparin gtt, norepinephrine gtt at 3 mcg/min.  Labs reviewed: BUN 38, Creatinine 1.17.  Diet Order:   Diet Order            Diet 2 gram sodium Room service appropriate? Yes with Assist; Fluid consistency: Thin  Diet effective now             EDUCATION NEEDS:   No education needs have been identified at this time  Skin:  Skin Assessment: Skin Integrity Issues:(wound left leg; unstageable pressure injury left ankle)  Last BM:  Unknown  Height:   Ht Readings from Last 1 Encounters:  12/25/18 _0  (1.499 m)   Weight:    Wt Readings from Last 1 Encounters:  12/30/18 69.5 kg   Ideal Body Weight:  44.7 kg  BMI:  Body mass index is 30.95 kg/m.  Estimated Nutritional Needs:   Kcal:  1600-1800  Protein:  105-115 grams on CVVHD  Fluid:  per MD  Willey Blade, MS, RD, LDN Office: [phone] Pager: [phone] After Hours/Weekend Pager: [phone]

## 2018-12-31 ENCOUNTER — Inpatient Hospital Stay: Payer: Medicare Other

## 2018-12-31 LAB — RENAL FUNCTION PANEL
Albumin: 3.1 g/dL — ABNORMAL LOW (ref 3.5–5.0)
Albumin: 3.1 g/dL — ABNORMAL LOW (ref 3.5–5.0)
Albumin: 3.1 g/dL — ABNORMAL LOW (ref 3.5–5.0)
Albumin: 3.2 g/dL — ABNORMAL LOW (ref 3.5–5.0)
Albumin: 3.3 g/dL — ABNORMAL LOW (ref 3.5–5.0)
Albumin: 3.3 g/dL — ABNORMAL LOW (ref 3.5–5.0)
Anion gap: 11 (ref 5–15)
Anion gap: 7 (ref 5–15)
Anion gap: 7 (ref 5–15)
Anion gap: 8 (ref 5–15)
Anion gap: 9 (ref 5–15)
Anion gap: 9 (ref 5–15)
BUN: 14 mg/dL (ref 8–23)
BUN: 16 mg/dL (ref 8–23)
BUN: 16 mg/dL (ref 8–23)
BUN: 16 mg/dL (ref 8–23)
BUN: 18 mg/dL (ref 8–23)
BUN: 19 mg/dL (ref 8–23)
CO2: 23 mmol/L (ref 22–32)
CO2: 25 mmol/L (ref 22–32)
CO2: 25 mmol/L (ref 22–32)
CO2: 26 mmol/L (ref 22–32)
CO2: 26 mmol/L (ref 22–32)
CO2: 27 mmol/L (ref 22–32)
Calcium: 8.5 mg/dL — ABNORMAL LOW (ref 8.9–10.3)
Calcium: 8.7 mg/dL — ABNORMAL LOW (ref 8.9–10.3)
Calcium: 8.7 mg/dL — ABNORMAL LOW (ref 8.9–10.3)
Calcium: 8.7 mg/dL — ABNORMAL LOW (ref 8.9–10.3)
Calcium: 8.8 mg/dL — ABNORMAL LOW (ref 8.9–10.3)
Calcium: 8.8 mg/dL — ABNORMAL LOW (ref 8.9–10.3)
Chloride: 103 mmol/L (ref 98–111)
Chloride: 103 mmol/L (ref 98–111)
Chloride: 103 mmol/L (ref 98–111)
Chloride: 104 mmol/L (ref 98–111)
Chloride: 104 mmol/L (ref 98–111)
Chloride: 104 mmol/L (ref 98–111)
Creatinine, Ser: 0.62 mg/dL (ref 0.44–1.00)
Creatinine, Ser: 0.62 mg/dL (ref 0.44–1.00)
Creatinine, Ser: 0.64 mg/dL (ref 0.44–1.00)
Creatinine, Ser: 0.66 mg/dL (ref 0.44–1.00)
Creatinine, Ser: 0.66 mg/dL (ref 0.44–1.00)
Creatinine, Ser: 0.86 mg/dL (ref 0.44–1.00)
GFR calc Af Amer: >60 mL/min (ref >60)
GFR calc Af Amer: >60 mL/min (ref >60)
GFR calc Af Amer: >60 mL/min (ref >60)
GFR calc Af Amer: >60 mL/min (ref >60)
GFR calc Af Amer: >60 mL/min (ref >60)
GFR calc Af Amer: >60 mL/min (ref >60)
GFR calc non Af Amer: >60 mL/min (ref >60)
GFR calc non Af Amer: >60 mL/min (ref >60)
GFR calc non Af Amer: >60 mL/min (ref >60)
GFR calc non Af Amer: >60 mL/min (ref >60)
GFR calc non Af Amer: >60 mL/min (ref >60)
GFR calc non Af Amer: >60 mL/min (ref >60)
Glucose, Bld: 100 mg/dL — ABNORMAL HIGH (ref 70–99)
Glucose, Bld: 113 mg/dL — ABNORMAL HIGH (ref 70–99)
Glucose, Bld: 114 mg/dL — ABNORMAL HIGH (ref 70–99)
Glucose, Bld: 116 mg/dL — ABNORMAL HIGH (ref 70–99)
Glucose, Bld: 142 mg/dL — ABNORMAL HIGH (ref 70–99)
Glucose, Bld: 94 mg/dL (ref 70–99)
Phosphorus: 1.4 mg/dL — ABNORMAL LOW (ref 2.5–4.6)
Phosphorus: 1.5 mg/dL — ABNORMAL LOW (ref 2.5–4.6)
Phosphorus: 1.5 mg/dL — ABNORMAL LOW (ref 2.5–4.6)
Phosphorus: 2.2 mg/dL — ABNORMAL LOW (ref 2.5–4.6)
Phosphorus: 2.6 mg/dL (ref 2.5–4.6)
Phosphorus: 2.6 mg/dL (ref 2.5–4.6)
Potassium: 3.9 mmol/L (ref 3.5–5.1)
Potassium: 3.9 mmol/L (ref 3.5–5.1)
Potassium: 3.9 mmol/L (ref 3.5–5.1)
Potassium: 4.2 mmol/L (ref 3.5–5.1)
Potassium: 4.2 mmol/L (ref 3.5–5.1)
Potassium: 4.4 mmol/L (ref 3.5–5.1)
Sodium: 136 mmol/L (ref 135–145)
Sodium: 137 mmol/L (ref 135–145)
Sodium: 137 mmol/L (ref 135–145)
Sodium: 138 mmol/L (ref 135–145)
Sodium: 138 mmol/L (ref 135–145)
Sodium: 138 mmol/L (ref 135–145)

## 2018-12-31 LAB — BPAM RBC
Blood Product Expiration Date: 202007182359
Blood Product Expiration Date: 202007182359
ISSUE DATE / TIME: 202006302129
Unit Type and Rh: 6200
Unit Type and Rh: 6200

## 2018-12-31 LAB — TYPE AND SCREEN
ABO/RH(D): A POS
Antibody Screen: NEGATIVE
Unit division: 0
Unit division: 0

## 2018-12-31 LAB — HEPARIN LEVEL (UNFRACTIONATED): Heparin Unfractionated: 0.18 IU/mL — ABNORMAL LOW (ref 0.30–0.70)

## 2018-12-31 LAB — MAGNESIUM
Magnesium: 1.8 mg/dL (ref 1.7–2.4)
Magnesium: 1.8 mg/dL (ref 1.7–2.4)
Magnesium: 1.8 mg/dL (ref 1.7–2.4)
Magnesium: 1.9 mg/dL (ref 1.7–2.4)
Magnesium: 1.9 mg/dL (ref 1.7–2.4)
Magnesium: 1.9 mg/dL (ref 1.7–2.4)

## 2018-12-31 LAB — CBC
HCT: 25.5 % — ABNORMAL LOW (ref 36.0–46.0)
Hemoglobin: 7.8 g/dL — ABNORMAL LOW (ref 12.0–15.0)
MCH: 26.7 pg (ref 26.0–34.0)
MCHC: 30.6 g/dL (ref 30.0–36.0)
MCV: 87.3 fL (ref 80.0–100.0)
Platelets: 142 10*3/uL — ABNORMAL LOW (ref 150–400)
RBC: 2.92 MIL/uL — ABNORMAL LOW (ref 3.87–5.11)
RDW: 17.2 % — ABNORMAL HIGH (ref 11.5–15.5)
WBC: 13.9 10*3/uL — ABNORMAL HIGH (ref 4.0–10.5)
nRBC: 0 % (ref 0.0–0.2)

## 2018-12-31 LAB — GLUCOSE, CAPILLARY: Glucose-Capillary: 108 mg/dL — ABNORMAL HIGH (ref 70–99)

## 2018-12-31 MED ORDER — THIAMINE HCL 100 MG/ML IJ SOLN
100.0000 mg | Freq: Every day | INTRAMUSCULAR | Status: DC
  Administered 2018-12-31 – 2019-01-04 (×5): 100 mg via INTRAVENOUS
  Filled 2018-12-31 (×5): qty 2

## 2018-12-31 MED ORDER — TRAZODONE HCL 50 MG PO TABS: 75.0000 mg | ORAL_TABLET | Freq: Every evening | ORAL | Status: DC | PRN

## 2018-12-31 MED ORDER — SODIUM CHLORIDE 0.9 % IV SOLN
1.0000 mg | Freq: Once | INTRAVENOUS | Status: AC
  Administered 2018-12-31: 1 mg via INTRAVENOUS
  Filled 2018-12-31: qty 0.2

## 2018-12-31 MED ORDER — SODIUM PHOSPHATES 45 MMOLE/15ML IV SOLN
30.0000 mmol | Freq: Once | INTRAVENOUS | Status: AC
  Administered 2018-12-31: 30 mmol via INTRAVENOUS
  Filled 2018-12-31: qty 10

## 2018-12-31 MED ORDER — SODIUM CHLORIDE 0.9 % IV SOLN
500.0000 mg | INTRAVENOUS | Status: DC
  Administered 2018-12-31 – 2019-01-04 (×5): 500 mg via INTRAVENOUS
  Filled 2018-12-31 (×5): qty 500

## 2018-12-31 MED ORDER — SODIUM CHLORIDE 0.9 % IV SOLN
500.0000 mg | Freq: Every day | INTRAVENOUS | Status: DC
  Administered 2018-12-31 – 2019-01-04 (×5): 500 mg via INTRAVENOUS
  Filled 2018-12-31 (×6): qty 1

## 2018-12-31 MED ORDER — ALBUMIN HUMAN 25 % IV SOLN
12.5000 g | Freq: Once | INTRAVENOUS | Status: AC
  Administered 2018-12-31: 12.5 g via INTRAVENOUS
  Filled 2018-12-31: qty 50

## 2018-12-31 NOTE — Progress Notes (Signed)
   12/31/18 1000  Clinical Encounter Type  Visited With Patient and family together  Visit Type Follow-up  Ch followed up with the family and patient. Son and Daughter [name] were in the room with the pt along with a sitter. Pt was awake and alert. Ch delivered a message of care from the church in which the both the ch and pt are members. Ch talked with the family briefly about the pt's membership at the church and expressed grief for not being to attend in-person service. Pt was glad to know that the church remembered her. Ch will follow up in the future as well to provide further support.

## 2018-12-31 NOTE — Progress Notes (Signed)
[facility] [place] NAME: Jermya Dowding    MR#:  [id number]  DATE OF BIRTH:  [birth year]  SUBJECTIVE:  CHIEF COMPLAINT:   Chief Complaint  Patient presents with  . Fall  . Hip Pain   No new complaint reported this morning.  Nursing staff at bedside and doing bedside care.  No fevers.  REVIEW OF SYSTEMS:  Unobtainable.  Patient pleasantly confused.  DRUG ALLERGIES:   Allergies  Allergen Reactions  . Ace Inhibitors     Cough  . Doxycycline Nausea Only  . Hydrocodone Itching and Nausea Only  . Mercury     Other reaction(s): Unknown  . Silver Dermatitis    Severe itching  . Cephalexin Rash  . Clindamycin/Lincomycin Rash  . Nickel Rash  . Penicillins Rash    Has patient had a PCN reaction causing immediate rash, facial/tongue/throat swelling, SOB or lightheadedness with hypotension: YES Has patient had a PCN reaction causing severe rash involving mucus membranes or skin necrosis: NO Has patient had a PCN reaction that required hospitalization NO Has patient had a PCN reaction occurring within the last 10 years: NO If all of the above answers are "NO", then may proceed with Cephalosporin use.   VITALS:  Blood pressure (!) 104/51, pulse 67, temperature 97.7 F (36.5 C), temperature source Axillary, resp. rate 17, height 4\' 11"  (1.499 m), weight 69.5 kg, SpO2 100 %. PHYSICAL EXAMINATION:   Physical Exam  Constitutional: She is well-developed, well-nourished, and in no distress.  HENT:  Head: Normocephalic and atraumatic.  Right Ear: External ear normal.  Eyes: Pupils are equal, round, and reactive to light. Conjunctivae are normal. Right eye exhibits no discharge.  Neck: Normal range of motion. Neck supple. No thyromegaly present.  Cardiovascular: Normal rate.  Irregularly irregular  Pulmonary/Chest: Effort normal and breath sounds normal. No respiratory distress.  Abdominal: Soft. Bowel sounds are normal. She exhibits  no distension.  Musculoskeletal:        General: No edema.     Comments: Slight discomfort and ecchymosis around right hip fracture site  Neurological:  Pleasantly confused  Skin: Skin is warm and dry. She is not diaphoretic. No erythema.  Psychiatric: Affect and judgment normal.   LABORATORY PANEL:  Female CBC Recent Labs  Lab 12/31/18 0446  WBC 13.9*  HGB 7.8*  HCT 25.5*  PLT 142*   ------------------------------------------------------------------------------------------------------------------ Chemistries  Recent Labs  Lab 12/25/18 2229  12/31/18 0446 12/31/18 1009  NA 138   < > 138 137  K 4.1   < > 4.4 4.2  CL 96*   < > 104 103  CO2 31   < > 23 26  GLUCOSE 139*   < > 94 142*  BUN 50*   < > 18 16  CREATININE 1.65*   < > 0.62 0.66  CALCIUM 9.4   < > 8.8* 8.7*  MG  --    < > 1.9  --   AST 24  --   --   --   ALT 14  --   --   --   ALKPHOS 107  --   --   --   BILITOT 0.8  --   --   --    < > = values in this interval not displayed.   RADIOLOGY:  Dg Chest Port 1 View  Result Date: 12/31/2018 CLINICAL DATA:  Shortness of breath. EXAM: PORTABLE CHEST 1 VIEW COMPARISON:  Multiple prior chest films.  The most recent is 12/28/2018 FINDINGS: Persistent diffuse interstitial and airspace process in the lungs along with bilateral pleural effusions. The heart remains enlarged. The pacer wires are stable. IMPRESSION: Persistent diffuse interstitial and airspace process in the lungs along with small bilateral pleural effusions and bibasilar atelectasis. Electronically Signed   By: Marijo Sanes M.D.   On: 12/31/2018 08:13   ASSESSMENT AND PLAN:     1. Closed right hip fracture secondary to mechanical fall  Patient appears to have some swelling around her right hip. Patient seen by orthopedic service. Coagulopathy was reversed with vitamin K and INR down to 1.2. Patient seen by cardiologist.  Patient is chronically very high risk for any surgical procedures.  No further  cardiovascular risk assessment will be beneficial however.  Patient transfused with 2 units of packed red blood cells previously  and hemoglobin improved to 7.3. Patient became hypoxic and hypotensive and had to be transferred to ICU Surgery currently on hold since patient is not hemodynamically stable and requiring pressors.    2.  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  Seen by nephrologist and patient started on Lasix drip low dose due to blood pressure being borderline.  Midodrine was also added to help maintain blood pressure while ongoing diuresis. Patient also required Levophed drip to maintain blood pressure. Patient also requiring CRRT by nephrology.  3.  Paroxysmal atrial fibrillation and history of mechanical mitral valve; AV paced Rate controlled.  Resumed amiodarone.  Blood pressure being borderline, limiting resumption of some of patient's home medications. Heparin drip discontinued recently due to further drop in hemoglobin requiring more packed red blood cell transfusions.  Hemoglobin stable at 7.8 this morning. Heparin drip resumed already to prevent CVA   4.  Acute on chronic hypoxic respiratory failure Patient had to be transferred to ICU recently due to hypoxia requiring up to 6 L of oxygen via nasal cannula.  Patient uses 3 L of oxygen at home at baseline.  5.  Acute blood loss anemia Hemoglobin stable at 7.8 this morning following packed red blood cell transfusion  6.  Acute kidney injury superimposed on chronic kidney disease stage III Nephrology already following and assisting with management. Patient started on CRRT and tolerating well.  7.  Left lower extremity chronic wounds. Being managed by surgical service.  DVT prophylaxis; On heparin drip   Patient seen by palliative care team.  Patient now DNR. CODE STATUS: DNR  TOTAL TIME TAKING CARE OF THIS PATIENT: 34 minutes.   More  than 50% of the time was spent in counseling/coordination of care: YES  POSSIBLE D/C IN 2-3 DAYS, DEPENDING ON CLINICAL CONDITION.   Tyria Springer M.D on 12/31/2018 at 2:11 PM  Between 7am to 6pm - Pager - [phone]  After 6pm go to [email or web address] - Proofreader  [facility]  Office  [phone]  CC: Primary care physician; Rusty Aus, MD  Note: This dictation was prepared with Dragon dictation along with smaller phrase technology. Any transcriptional errors that result from this process are unintentional.

## 2018-12-31 NOTE — Progress Notes (Signed)
[place], [place] 12/31/18  Subjective:  Patient seen at bedside. Appears less agitated today. Still on CRRT at the moment.   Objective:  Vital signs in last 24 hours:  Temp:  [97.6 F (36.4 C)-98.3 F (36.8 C)] 97.7 F (36.5 C) (07/02 1200) Pulse Rate:  [59-102] 69 (07/02 1200) Resp:  [15-36] 25 (07/02 1200) BP: (95-151)/(40-130) 108/60 (07/02 1200) SpO2:  [89 %-100 %] 96 % (07/02 1200) FiO2 (%):  [40 %-100 %] 45 % (07/02 1200) Weight:  [69.5 kg] 69.5 kg (07/02 0447)  Weight change: 0 kg Filed Weights   12/25/18 2219 12/30/18 0500 12/31/18 0447  Weight: 67.1 kg 69.5 kg 69.5 kg    Intake/Output:    Intake/Output Summary (Last 24 hours) at 12/31/2018 1347 Last data filed at 12/31/2018 1300 Gross per 24 hour  Intake 1144.47 ml  Output 1147 ml  Net -2.53 ml     Physical Exam: General:  Frail elderly lady, critically ill-appearing  HEENT  moist oral mucous membranes  Neck  supple no JVD,  Pulm/lungs  bibasilar rales  CVS/Heart  irregular, 3/6 SEM  Abdomen:   Soft, nontender  Extremities:  1+ LE edema  Neurologic:  Awake, alert  Skin:  Right leg ecchymosis, resolving hematoma    Basic Metabolic Panel:  Recent Labs  Lab 12/30/18 0823 12/30/18 1306 12/30/18 1633 12/30/18 2343 12/31/18 0446 12/31/18 1009  NA 136 138 137 138 138 137  K 4.0 4.2 4.1 4.2 4.4 4.2  CL 99 104 101 103 104 103  CO2 28 32 29 26 23 26   GLUCOSE 111* 109* 111* 100* 94 142*  BUN 38* 29* 25* 19 18 16   CREATININE 1.17* 0.81 0.73 0.62 0.62 0.66  CALCIUM 8.4* 8.4* 8.6* 8.8* 8.8* 8.7*  MG 2.2 1.9 2.0 1.9 1.9  --   PHOS 2.6 2.3* 2.1* 1.5* 1.5* 1.4*     CBC: Recent Labs  Lab 12/25/18 2229 12/26/18 0719  12/27/18 0740 12/28/18 0008  12/30/18 0823 12/30/18 1306 12/30/18 1633 12/30/18 2343 12/31/18 0446  WBC 6.3 8.4  --  12.6* 11.1*  --  9.6  --   --   --  13.9*  NEUTROABS 4.7  --   --   --   --   --   --   --   --   --   --   HGB 8.0* 5.9*   < >  7.3* 6.1*   < > 7.4* 7.3* 7.5* 7.8* 7.8*  HCT 27.2* 20.0*   < > 23.7* 19.9*   < > 24.4* 24.0* 24.7* 25.6* 25.5*  MCV 82.7 83.0  --  85.6 84.7  --  87.8  --   --   --  87.3  PLT 182 175  --  182 170  --  122*  --   --   --  142*   < > = values in this interval not displayed.     No results found for: HEPBSAG, HEPBSAB, HEPBIGM    Microbiology:  Recent Results (from the past 240 hour(s))  SARS Coronavirus 2 (CEPHEID- Performed in [place] hospital lab), Hosp Order     Status: None   Collection Time: 12/26/18 12:33 AM   Specimen: Nasopharyngeal Swab  Result Value Ref Range Status   SARS Coronavirus 2 NEGATIVE NEGATIVE Final    Comment: (NOTE) If result is NEGATIVE SARS-CoV-2 target nucleic acids are NOT DETECTED. The SARS-CoV-2 RNA is generally detectable in upper and lower  respiratory specimens  during the acute phase of infection. The lowest  concentration of SARS-CoV-2 viral copies this assay can detect is 250  copies / mL. A negative result does not preclude SARS-CoV-2 infection  and should not be used as the sole basis for treatment or other  patient management decisions.  A negative result may occur with  improper specimen collection / handling, submission of specimen other  than nasopharyngeal swab, presence of viral mutation(s) within the  areas targeted by this assay, and inadequate number of viral copies  (<250 copies / mL). A negative result must be combined with clinical  observations, patient history, and epidemiological information. If result is POSITIVE SARS-CoV-2 target nucleic acids are DETECTED. The SARS-CoV-2 RNA is generally detectable in upper and lower  respiratory specimens dur ing the acute phase of infection.  Positive  results are indicative of active infection with SARS-CoV-2.  Clinical  correlation with patient history and other diagnostic information is  necessary to determine patient infection status.  Positive results do  not rule out bacterial  infection or co-infection with other viruses. If result is PRESUMPTIVE POSTIVE SARS-CoV-2 nucleic acids MAY BE PRESENT.   A presumptive positive result was obtained on the submitted specimen  and confirmed on repeat testing.  While 2019 novel coronavirus  (SARS-CoV-2) nucleic acids may be present in the submitted sample  additional confirmatory testing may be necessary for epidemiological  and / or clinical management purposes  to differentiate between  SARS-CoV-2 and other Sarbecovirus currently known to infect humans.  If clinically indicated additional testing with an alternate test  methodology [phone]) is advised. The SARS-CoV-2 RNA is generally  detectable in upper and lower respiratory sp ecimens during the acute  phase of infection. The expected result is Negative. Fact Sheet for Patients:  StrictlyIdeas.no Fact Sheet for Healthcare Providers: BankingDealers.co.za This test is not yet approved or cleared by the [place] FDA and has been authorized for detection and/or diagnosis of SARS-CoV-2 by FDA under an Emergency Use Authorization (EUA).  This EUA will remain in effect (meaning this test can be used) for the duration of the COVID-19 declaration under Section 564(b)(1) of the Act, 21 U.S.C. section 360bbb-3(b)(1), unless the authorization is terminated or revoked sooner. Performed at [facility], [address] [place], [place] [address]   MRSA PCR Screening     Status: None   Collection Time: 12/26/18  1:54 AM   Specimen: Nasal Mucosa; Nasopharyngeal  Result Value Ref Range Status   MRSA by PCR NEGATIVE NEGATIVE Final    Comment:        The GeneXpert MRSA Assay (FDA approved for NASAL specimens only), is one component of a comprehensive MRSA colonization surveillance program. It is not intended to diagnose MRSA infection nor to guide or monitor treatment for MRSA infections. Performed at [facility], [address] [place], [place] [address]   CULTURE, BLOOD (ROUTINE X 2) w Reflex to ID Panel     Status: None (Preliminary result)   Collection Time: 12/28/18  3:09 PM   Specimen: BLOOD  Result Value Ref Range Status   Specimen Description BLOOD RIGHT ANTECUBITAL  Final   Special Requests   Final    BOTTLES DRAWN AEROBIC AND ANAEROBIC Blood Culture adequate volume   Culture   Final    NO GROWTH 3 DAYS Performed at [facility], [address] [place], [place] [address]    Report Status PENDING  Incomplete  CULTURE, BLOOD (ROUTINE X 2) w  Reflex to ID Panel     Status: None (Preliminary result)   Collection Time: 12/28/18  3:19 PM   Specimen: BLOOD  Result Value Ref Range Status   Specimen Description BLOOD BLOOD RIGHT HAND  Final   Special Requests   Final    BOTTLES DRAWN AEROBIC AND ANAEROBIC Blood Culture adequate volume   Culture   Final    NO GROWTH 3 DAYS Performed at [facility], [address], [place], [place] [address]    Report Status PENDING  Incomplete    Coagulation Studies: Recent Labs    12/29/18 2152-10-09  LABPROT 15.1  INR 1.2    Urinalysis: No results for input(s): COLORURINE, LABSPEC, PHURINE, GLUCOSEU, HGBUR, BILIRUBINUR, KETONESUR, PROTEINUR, UROBILINOGEN, NITRITE, LEUKOCYTESUR in the last 72 hours.  Invalid input(s): APPERANCEUR    Imaging: Dg Chest Port 1 View  Result Date: 12/31/2018 CLINICAL DATA:  Shortness of breath. EXAM: PORTABLE CHEST 1 VIEW COMPARISON:  Multiple prior chest films. The most recent is 12/28/2018 FINDINGS: Persistent diffuse interstitial and airspace process in the lungs along with bilateral pleural effusions. The heart remains enlarged. The pacer wires are stable. IMPRESSION: Persistent diffuse interstitial and airspace process in the lungs along with small bilateral pleural effusions and bibasilar atelectasis. Electronically Signed   By: Marijo Sanes M.D.   On: 12/31/2018 08:13      Medications:   . albumin human    . ascorbic acid 500 mg in sodium chloride 0.9% 50 ml    . azithromycin Stopped (12/31/18 1039)  . heparin 1,100 Units/hr (12/31/18 1200)  . norepinephrine (LEVOPHED) Adult infusion 4 mcg/min (12/31/18 1200)  . pureflow 2,500 mL/hr at 12/31/18 1000  . sodium phosphate  Dextrose 5% IVPB 43 mL/hr at 12/31/18 1200   . sodium chloride   Intravenous Once  . sodium chloride   Intravenous Once  . amiodarone  200 mg Oral Daily  . Chlorhexidine Gluconate Cloth  6 each Topical Daily  . cholecalciferol  2,000 Units Oral Daily  . digoxin  0.125 mg Intravenous Daily  . feeding supplement (ENSURE ENLIVE)  237 mL Oral BID BM  . ipratropium-albuterol  3 mL Nebulization TID  . midodrine  5 mg Oral TID WC  . multivitamin-lutein  1 capsule Oral Daily  . polyethylene glycol  17 g Oral Daily  . thiamine injection  100 mg Intravenous Daily   acetaminophen **OR** acetaminophen, albuterol, heparin, ondansetron **OR** ondansetron (ZOFRAN) IV, traMADol, traZODone  Assessment/ Plan:  84 y.o.Caucasian  female with congestive heart failure EF 25%, chronic kidney disease, hypertension, atrial fibrillation, lymphedema, mitral valve replacement (mechanical valve) requiring chronic anticoagulation, COPD, osteoporosis, history of multiple lumbar compression fractures requiring kyphoplasty, history of left leg hematoma with evacuation and porcine allograft, was admitted on 12/25/2018 with right hip fracture.   1.  Acute kidney injury, chronic kidney disease stage III.  Baseline 1.37/GFR 36 from December 15, 2018 2.  Chronic systolic congestive heart failure with EF 25% 3.  Lower extremity edema with third spacing 4.  Pulmonary hypertension 5.  Mechanical heart valve requiring chronic anticoagulation  -Urine output dropped to 130 cc over the preceding 24 hours.  She is tolerating CRRT well.  We will maintain the patient on CRRT with ultrafiltration target of 75 cc/h.  We will  decrease dialysate flow rate to 1.5 L/h.  This should help to limit hypophosphatemia.  Continue to monitor serum electrolytes closely otherwise.  Overall guarded prognosis.  LOS: 6 Nykira Reddix 7/2/20201:47 PM  [place]  [place], [place]  Note: This note was prepared with Dragon dictation. Any transcription errors are unintentional

## 2018-12-31 NOTE — Progress Notes (Signed)
ANTICOAGULATION CONSULT NOTE  Pharmacy Consult for Heparin Drip Indication: mechanical Mitral Valve/A.fib  Allergies  Allergen Reactions  . Ace Inhibitors     Cough  . Doxycycline Nausea Only  . Hydrocodone Itching and Nausea Only  . Mercury     Other reaction(s): Unknown  . Silver Dermatitis    Severe itching  . Cephalexin Rash  . Clindamycin/Lincomycin Rash  . Nickel Rash  . Penicillins Rash    Has patient had a PCN reaction causing immediate rash, facial/tongue/throat swelling, SOB or lightheadedness with hypotension: YES Has patient had a PCN reaction causing severe rash involving mucus membranes or skin necrosis: NO Has patient had a PCN reaction that required hospitalization NO Has patient had a PCN reaction occurring within the last 10 years: NO If all of the above answers are "NO", then may proceed with Cephalosporin use.    Patient Measurements: Height: 4\' 11"  (149.9 cm) Weight: 153 lb 3.5 oz (69.5 kg) IBW/kg (Calculated) : 43.2 Heparin Dosing Weight: 57.9kg  Vital Signs: Temp: 97.8 F (36.6 C) (07/02 1600) Temp Source: Axillary (07/02 1600) BP: 88/61 (07/02 1600) Pulse Rate: 65 (07/02 1600)  Labs: Recent Labs    12/29/18 2154  12/30/18 0823  12/30/18 1633 12/30/18 2343 12/31/18 0446 12/31/18 1009 12/31/18 1319 12/31/18 1527  HGB  --    < > 7.4*   < > 7.5* 7.8* 7.8*  --   --   --   HCT  --    < > 24.4*   < > 24.7* 25.6* 25.5*  --   --   --   PLT  --   --  122*  --   --   --  142*  --   --   --   APTT 57*  --   --   --   --   --   --   --   --   --   LABPROT 15.1  --   --   --   --   --   --   --   --   --   INR 1.2  --   --   --   --   --   --   --   --   --   HEPARINUNFRC  --   --  0.12*  --  0.26*  --   --   --   --  0.18*  CREATININE 1.82*   < > 1.17*   < > 0.73 0.62 0.62 0.66 0.64  --    < > = values in this interval not displayed.    Estimated Creatinine Clearance: 46.8 mL/min (by C-G formula based on SCr of 0.64 mg/dL).   Medical  History: Past Medical History:  Diagnosis Date  . Arthritis    "back, hands, knees" (04/10/2016)  . Chronic back pain   . 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 EF 25% by echo in 08/2015 b. RHC in 08/2015 showed normal filling pressures; c. 10/2018 Echo (in setting of atrial tachycardia): EF 25-30%, impaired relaxation. Glob HK. RVSP 36mmHg. Mod dil RA/LA. Mech MV w/ nl fxn (mean grad 32mmHg). Mod TR.   Marland Kitchen Chronic kidney disease   . Compression fracture    "several; all in my back" (04/10/2016)  . GERD (gastroesophageal reflux disease)   . Hypertension   . Lymphedema   . Pacemaker 05/2017  . PAF (paroxysmal atrial fibrillation) ([place])  a. On amio & Coumadin - CHA2DS2VASc = 4.  . S/P MVR (mitral valve replacement)    a. MVR 1994 b. redo MVR in 08/2014 - on Coumadin  . Shortness of breath dyspnea     Medications:    Assessment: Closed right hip fracture secondary to mechanical fall  Coagulopathy was reversed with vitamin K and INR down to 1.2 on 6/28.-Patient on Coumadin prior to admission Paroxysmal atrial fibrillation and history of mechanical mitral valve; AV paced Rate controlled. Given history of mechanical mitral valve, patient was previously started on Heparin on 6/28 but was stopped on 6/29 due to significant drop in hemoglobin(7.8>6.1). Patient has since received PRBC transfusion and Hgb is currently 7.2. Per Dr. Saunders Revel, it is safe to restart heparin drip and has placed the consult for Pharmacy to re-initiate   Goal of Therapy:  Heparin level 0.3-0.7 units/ml Monitor platelets  Plan: Anti-Xa level obtained with am. Anti-Xa level obtained via central line. Anti-Xa levels may be obtained via central line. Patient continuing to refuse all sticks. Will increase rate to 1250 units/hr with no bolus. Patient should not receive heparin bolus due to clinical course including recent blood transfusions. Next anti-Xa level is at [place].    Pharmacy will continue to monitor and adjust per consult.   Simpson,[name] L, RPh 12/31/2018 4:40 PM

## 2018-12-31 NOTE — Consult Note (Signed)
ORTHOPAEDICS Patient is awake and alert but confused this morning.  She was apparently confused and restless last night as per nursing. Mittens are in place to both hands. Vasopressors are being used to maintain the patient's blood pressure.  Still with significant edema to the right thigh with serous drainage noted.  Large ecchymotic areas noted to the area of the hip.  Plans for surgical intervention have been canceled at this time due to the patient's multiple comorbidities.  Palliative care is actively involved.  Ashlin Kreps P. Holley Bouche M.D.

## 2018-12-31 NOTE — Plan of Care (Signed)
Pt more cooperative this shift, took PO meds with encouragement, she does much better with pudding or applesauce, taking pills with water results in excessive throat clearing, CCRT running w/o incident, pt more willing to turn and reposition.  Sitter and daughter at bedside.  Pt ate all of the lunch her daughter brought for her and fed herself.

## 2018-12-31 NOTE — Progress Notes (Signed)
CRRT Medication Adjustments:    Pharmacy consulted for medication adjustments for 84 yo female initiated on CRRT on 6/30. No adjustments warranted at this time.   Pharmacy will continue to monitor and adjust per consult.   Simpson,[name] L,  12/31/2018 4:40 PM

## 2018-12-31 NOTE — Progress Notes (Signed)
Marland Kitchen                                                                                                                                                                              CRITICAL CARE NOTE        SUBJECTIVE FINDINGS & SIGNIFICANT EVENTS   Patient remains critically ill, prognosis is guarded. Slight improvement from yesterday. Able to interact and participate in incentive spirometry and MetaNeb therapy.   -Had CVVH for appx 36h (removed 3L to date) and made 1547ml urine during this time period - total net negative however only 150cc due to previous infusions.     -periods of hyperactive ICU delirium overnight  -h/h stable   PAST MEDICAL HISTORY   Past Medical History:  Diagnosis Date  . Arthritis    "back, hands, knees" (04/10/2016)  . Chronic back pain   . 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 EF 25% by echo in 08/2015 b. RHC in 08/2015 showed normal filling pressures; c. 10/2018 Echo (in setting of atrial tachycardia): EF 25-30%, impaired relaxation. Glob HK. RVSP 37mmHg. Mod dil RA/LA. Mech MV w/ nl fxn (mean grad 60mmHg). Mod TR.   Marland Kitchen Chronic kidney disease   . Compression fracture    "several; all in my back" (04/10/2016)  . GERD (gastroesophageal reflux disease)   . Hypertension   . Lymphedema   . Pacemaker 05/2017  . PAF (paroxysmal atrial fibrillation) ([place])    a. On amio & Coumadin - CHA2DS2VASc = 4.  . S/P MVR (mitral valve replacement)    a. MVR 1994 b. redo MVR in 08/2014 - on Coumadin  . Shortness of breath dyspnea      SURGICAL HISTORY   Past Surgical History:  Procedure Laterality Date  . BREAST LUMPECTOMY Right 2005?   benign lump excision  . CARDIAC CATHETERIZATION    . [place], 2016   "MVR; MVR"  . CARDIOVERSION N/A 11/06/2018   Procedure: CARDIOVERSION;  Surgeon: Minna Merritts, MD;  Location: [facility];  Service: Cardiovascular;  Laterality: N/A;  . CATARACT  EXTRACTION W/PHACO Right 02/20/2015   Procedure: CATARACT EXTRACTION PHACO AND INTRAOCULAR LENS PLACEMENT ([place]);  Surgeon: Estill Cotta, MD;  Location: [facility];  Service: Ophthalmology;  Laterality: Right;  US01:31.2 AP 25.3% CDE40.13 Fluid lot # [id number]  . DRESSING CHANGE UNDER ANESTHESIA Left 11/17/2018   Procedure: DRESSING CHANGE;  Surgeon: Robert Bellow, MD;  Location: [facility];  Service: General;  Laterality: Left;  no anesthesia  . ELECTROPHYSIOLOGIC STUDY N/A 10/09/2015   Procedure: CARDIOVERSION;  Surgeon: Wellington Hampshire, MD;  Location: [facility];  Service: Cardiovascular;  Laterality: N/A;  .  INCISION AND DRAINAGE OF WOUND Left 11/13/2018   Procedure: LEFT LEG DRESSING CHANGE;  Surgeon: Robert Bellow, MD;  Location: [facility];  Service: General;  Laterality: Left;  . INCISION AND DRAINAGE OF WOUND Left 11/19/2018   Procedure: LEFT LEG DRESSING CHANGE AND GRAFT APPLICATION;  Surgeon: Robert Bellow, MD;  Location: [facility];  Service: General;  Laterality: Left;  . IR GENERIC HISTORICAL  04/01/2016   IR RADIOLOGIST EVAL & MGMT 04/01/2016 [facility]  . IR GENERIC HISTORICAL  04/15/2016   IR VERTEBROPLASTY CERV/THOR BX INC UNI/BIL INC/INJECT/IMAGING 04/15/2016 Luanne Bras, MD [facility]  . IR GENERIC HISTORICAL  04/15/2016   IR VERTEBROPLASTY EA ADDL (T&LS) BX INC UNI/BIL INC INJECT/IMAGING 04/15/2016 Luanne Bras, MD [facility]  . IR GENERIC HISTORICAL  04/15/2016   IR VERTEBROPLASTY EA ADDL (T&LS) BX INC UNI/BIL INC INJECT/IMAGING 04/15/2016 Luanne Bras, MD [facility]  . IR GENERIC HISTORICAL  05/20/2016   IR RADIOLOGIST EVAL & MGMT 05/20/2016 [facility]  . IR GENERIC HISTORICAL  06/13/2016   IR VERTEBROPLASTY EA ADDL (T&LS) BX INC UNI/BIL INC INJECT/IMAGING 06/13/2016 Luanne Bras, MD [facility]  . IR GENERIC HISTORICAL  06/13/2016   IR SACROPLASTY BILATERAL 06/13/2016 Luanne Bras, MD [facility]  . IR GENERIC  HISTORICAL  06/13/2016   IR VERTEBROPLASTY LUMBAR BX INC UNI/BIL INC/INJECT/IMAGING 06/13/2016 Luanne Bras, MD [facility]  . IRRIGATION AND DEBRIDEMENT HEMATOMA Left 07/05/2015   Procedure: IRRIGATION AND DEBRIDEMENT HEMATOMA;  Surgeon: Robert Bellow, MD;  Location: [facility];  Service: General;  Laterality: Left;  . IRRIGATION AND DEBRIDEMENT HEMATOMA Left 11/11/2018   Procedure: IRRIGATION AND DEBRIDEMENT LEFT LEG HEMATOMA;  Surgeon: Robert Bellow, MD;  Location: [facility];  Service: General;  Laterality: Left;  . pyloric stenosis  07/1937  . US ECHOCARDIOGRAPHY       FAMILY HISTORY   Family History  Problem Relation Age of Onset  . Stroke Mother   . Renal Disease Father      SOCIAL HISTORY   Social History   Tobacco Use  . Smoking status: Former Smoker    Packs/day: 1.00    Years: 27.00    Pack years: 27.00    Types: Cigarettes    Quit date: 07/01/1980    Years since quitting: 38.5  . Smokeless tobacco: Never Used  . Tobacco comment: "quit smoking ~ 1980  Substance Use Topics  . Alcohol use: Not Currently    Alcohol/week: 0.0 standard drinks    Comment: 04/10/2016 "I'll have a drink on holidays/special occasions"  . Drug use: No     MEDICATIONS   Current Medication:  Current Facility-Administered Medications:  .  0.9 %  sodium chloride infusion (Manually program via Guardrails IV Fluids), , Intravenous, Once, Ojie, Jude, MD .  0.9 %  sodium chloride infusion (Manually program via Guardrails IV Fluids), , Intravenous, Once, Ottie Glazier, MD .  acetaminophen (TYLENOL) tablet 650 mg, 650 mg, Oral, Q6H PRN, 650 mg at 12/29/18 2216 **OR** acetaminophen (TYLENOL) suppository 650 mg, 650 mg, Rectal, Q6H PRN, Lance Coon, MD .  albumin human 25 % solution 12.5 g, 12.5 g, Intravenous, Once, Doral Digangi, MD .  albuterol (PROVENTIL) (2.5 MG/3ML) 0.083% nebulizer solution 2.5 mg, 2.5 mg, Nebulization, Q2H PRN, Lanney Gins, Johne Buckle, MD .  amiodarone (PACERONE)  tablet 200 mg, 200 mg, Oral, Daily, Ojie, Jude, MD, 200 mg at 12/29/18 0806 .  ascorbic acid injection 500 mg, 500 mg, Intravenous, Daily, Ottie Glazier, MD .  azithromycin [facility])  500 mg in sodium chloride 0.9 % 250 mL IVPB, 500 mg, Intravenous, Q24H, Lanney Gins, Thania Woodlief, MD .  Chlorhexidine Gluconate Cloth 2 % PADS 6 each, 6 each, Topical, Daily, Ottie Glazier, MD, 6 each at 12/30/18 0730 .  cholecalciferol (VITAMIN D3) tablet 2,000 Units, 2,000 Units, Oral, Daily, Ojie, Jude, MD, 2,000 Units at 12/29/18 0806 .  digoxin (LANOXIN) 0.25 MG/ML injection 0.125 mg, 0.125 mg, Intravenous, Daily, Lanney Gins, France Lusty, MD, 0.125 mg at 12/30/18 1100 .  feeding supplement (ENSURE ENLIVE) (ENSURE ENLIVE) liquid 237 mL, 237 mL, Oral, BID BM, Ojie, Jude, MD, 237 mL at 12/28/18 1543 .  folic acid 1 mg in sodium chloride 0.9 % 50 mL IVPB, 1 mg, Intravenous, Once, Oveda Dadamo, MD .  heparin ADULT infusion 100 units/mL (25000 units/225mL sodium chloride 0.45%), 1,100 Units/hr, Intravenous, Continuous, Charlett Nose, RPH, Last Rate: 11 mL/hr at 12/31/18 0609, 1,100 Units/hr at 12/31/18 0609 .  heparin injection 1,000-6,000 Units, 1,000-6,000 Units, CRRT, PRN, Lateef, Munsoor, MD .  ipratropium-albuterol (DUONEB) 0.5-2.5 (3) MG/3ML nebulizer solution 3 mL, 3 mL, Nebulization, TID, Lanney Gins, Terica Yogi, MD, 3 mL at 12/30/18 2037 .  midodrine (PROAMATINE) tablet 5 mg, 5 mg, Oral, TID WC, Franky Reier, MD, 5 mg at 12/29/18 1232 .  multivitamin-lutein (OCUVITE-LUTEIN) capsule 1 capsule, 1 capsule, Oral, Daily, Ojie, Jude, MD, 1 capsule at 12/29/18 0807 .  norepinephrine (LEVOPHED) 4mg  in 284mL premix infusion, 0-40 mcg/min, Intravenous, Titrated, Darel Hong D, NP, Last Rate: 15 mL/hr at 12/31/18 0609, 4 mcg/min at 12/31/18 0609 .  ondansetron (ZOFRAN) tablet 4 mg, 4 mg, Oral, Q6H PRN **OR** ondansetron (ZOFRAN) injection 4 mg, 4 mg, Intravenous, Q6H PRN, Lance Coon, MD .  polyethylene glycol (MIRALAX /  GLYCOLAX) packet 17 g, 17 g, Oral, Daily, Ojie, Jude, MD .  pureflow IV solution for Dialysis, , CRRT, Continuous, Lateef, Munsoor, MD, Last Rate: 2,500 mL/hr at 12/31/18 0609, 3 each at 12/31/18 0609 .  sodium phosphate 30 mmol in dextrose 5 % 250 mL infusion, 30 mmol, Intravenous, Once, Wilmarie Sparlin, MD .  thiamine (B-1) injection 100 mg, 100 mg, Intravenous, Daily, Kingstin Heims, MD .  traMADol (ULTRAM) tablet 50 mg, 50 mg, Oral, Q12H PRN, Ojie, Jude, MD .  traZODone (DESYREL) tablet 50 mg, 50 mg, Oral, QHS PRN, Darel Hong D, NP    ALLERGIES   Ace inhibitors, Doxycycline, Hydrocodone, Mercury, Silver, Cephalexin, Clindamycin/lincomycin, Nickel, and Penicillins    REVIEW OF SYSTEMS     10 point ROS done and is negative except for R hip pain and mild aggitation.   PHYSICAL EXAMINATION   Vitals:   12/31/18 0530 12/31/18 0615  BP: 110/87 (!) 113/47  Pulse: 72 80  Resp: (!) 21 (!) 27  Temp:    SpO2: 95% 96%    GENERAL:Mild distress due to restlessness HEAD: Normocephalic, atraumatic.  EYES: Pupils equal, round, reactive to light.  No scleral icterus.  MOUTH: Moist mucosal membrane. NECK: Supple. No thyromegaly. No nodules. No JVD.  PULMONARY:crackles at bases bilaterally CARDIOVASCULAR: S1 and S2. Regular rate and rhythm. No murmurs, rubs, or gallops.  GASTROINTESTINAL: Soft, nontender, non-distended. No masses. Positive bowel sounds. No hepatosplenomegaly.  MUSCULOSKELETAL: Lower extremity edema with previous left extremity hematoma and now right hip fracture with serosanguineous oozing from [facility] on the right NEUROLOGIC: Mild distress due to acute illness SKIN:intact,warm,dry   LABS AND IMAGING       LAB RESULTS: Recent Labs  Lab 12/30/18 1633 12/30/18 2343 12/31/18 0446  NA 137 138 138  K  4.1 4.2 4.4  CL 101 103 104  CO2 29 26 23   BUN 25* 19 18  CREATININE 0.73 0.62 0.62  GLUCOSE 111* 100* 94   Recent Labs  Lab 12/28/18 0008  12/30/18 0823   12/30/18 1633 12/30/18 2343 12/31/18 0446  HGB 6.1*   < > 7.4*   < > 7.5* 7.8* 7.8*  HCT 19.9*   < > 24.4*   < > 24.7* 25.6* 25.5*  WBC 11.1*  --  9.6  --   --   --  13.9*  PLT 170  --  122*  --   --   --  142*   < > = values in this interval not displayed.     IMAGING RESULTS: No results found.    ASSESSMENT AND PLAN    -Multidisciplinary rounds held today  Acute on chronic hypoxemic respiratory failure -Due tochronicsystolic CHF with EF 25 to 30% -Cardiology on case appreciate input-follow recommendations -Currently being diuresed by nephrology  -Complicated byacute blood loss anemia, pulmonary edema, CKD, atelectasis, deconditioning, R hip fracture -repeat CXR today - MetaNEB BID today   Chronic HFrEF with EF 59-56%  -Complicated by AF and mechMV requiring AC - discussed with cardiology Dr End - concern for high risk for acute CVA without AC - on Heparin gtt tolerating acceptably   Acute blood loss anemia   - due to R hip fracture and hematoma formation    - s/p 3 units prbc transfusion     - h/h borderline low, q4h h/h monitoring , transfuse as needed    Rt hip fracture      -discussed case and care plan with  Dr Marry Guan and anesthesiology Dr Janett Billow - appreciate input    - high risk patient holding plan for surgery until further eval    Pulmonary pre-operative evaluation  ARISCAT ([place]) preoperative pulmonary risk index in adults- High risk - 42.1% chance of any pulmonary complication postoperatively           -Mainly due to advanced age, baseline chronic hypoxemia, preoperative anemia, significant comorbid conditions  [place] respiratory failure index - moderate-high risk risk- 10.1% chance of post operative respiratory failure requiring MV >48h  GUPTA calculator for postoperative respiratory failure - moderate risk - 4.03% chance of postoperative respiratory failure requiring mechanical ventilation over 48 hours   Acute on chronic renal  insufficiency -Renal function improved post CVVH -Likely cardiorenal in origin -creatinine normalized on cvvh    Bilateral atelectasis -Contributing to hypoxemia - chest PT - MetaNeb BID - Incentive spirometer to be used each hour multiple times - please encourage to use  - patient has difficulty expectorating phlegm - will ask RT to bring Acapella device as well    Moderate protein calorie malnutrition and deconditioning -Contributing to dyspnea -Patient working with physical therapy was able to get out of bed and slowly walk today but encouraged to continue this as well as outpatient -Nutritional assessment patient has been aspirating and has been evaluated with modified barium swallow recommended aspiration precautions-s/p dietician evaluation - appreciate input     GI/Nutrition GI PROPHYLAXIS as indicated DIET-->TF's as tolerated Constipation protocol as indicated  ENDO - ICU hypoglycemic\Hyperglycemia protocol -check FSBS per protocol   ELECTROLYTES -follow labs as needed -replace as needed -pharmacy consultation   DVT/GI PRX ordered -SCDs  TRANSFUSIONS AS NEEDED MONITOR FSBS ASSESS the need for LABS as needed   Critical care provider statement:    Critical care time (minutes):  32  Critical care time was exclusive of:  Separately billable procedures and treating other patients   Critical care was necessary to treat or prevent imminent or life-threatening deterioration of the following conditions:  acute hypoxemic respiratory failure, AKI, acute blood loss anemia, Rt hip fracture, multiple comorbid conditoins   Critical care was time spent personally by me on the following activities:  Development of treatment plan with patient or surrogate, discussions with consultants, evaluation of patient's response to treatment, examination of patient, obtaining history from patient or surrogate, ordering and performing treatments and interventions, ordering and  review of laboratory studies and re-evaluation of patient's condition.  I assumed direction of critical care for this patient from another provider in my specialty: no    This document was prepared using Dragon voice recognition software and may include unintentional dictation errors.    Ottie Glazier, M.D.  Division of [place]

## 2019-01-01 LAB — RENAL FUNCTION PANEL
Albumin: 3.1 g/dL — ABNORMAL LOW (ref 3.5–5.0)
Albumin: 3.1 g/dL — ABNORMAL LOW (ref 3.5–5.0)
Albumin: 3.3 g/dL — ABNORMAL LOW (ref 3.5–5.0)
Albumin: 3.3 g/dL — ABNORMAL LOW (ref 3.5–5.0)
Albumin: 3.5 g/dL (ref 3.5–5.0)
Albumin: 3.5 g/dL (ref 3.5–5.0)
Anion gap: 8 (ref 5–15)
Anion gap: 8 (ref 5–15)
Anion gap: 8 (ref 5–15)
Anion gap: 11 (ref 5–15)
Anion gap: 8 (ref 5–15)
Anion gap: 9 (ref 5–15)
BUN: 16 mg/dL (ref 8–23)
BUN: 16 mg/dL (ref 8–23)
BUN: 17 mg/dL (ref 8–23)
BUN: 19 mg/dL (ref 8–23)
BUN: 20 mg/dL (ref 8–23)
BUN: 22 mg/dL (ref 8–23)
CO2: 26 mmol/L (ref 22–32)
CO2: 26 mmol/L (ref 22–32)
CO2: 26 mmol/L (ref 22–32)
CO2: 23 mmol/L (ref 22–32)
CO2: 26 mmol/L (ref 22–32)
CO2: 24 mmol/L (ref 22–32)
Calcium: 8.5 mg/dL — ABNORMAL LOW (ref 8.9–10.3)
Calcium: 8.8 mg/dL — ABNORMAL LOW (ref 8.9–10.3)
Calcium: 9 mg/dL (ref 8.9–10.3)
Calcium: 8.8 mg/dL — ABNORMAL LOW (ref 8.9–10.3)
Calcium: 8.9 mg/dL (ref 8.9–10.3)
Calcium: 9 mg/dL (ref 8.9–10.3)
Chloride: 103 mmol/L (ref 98–111)
Chloride: 104 mmol/L (ref 98–111)
Chloride: 103 mmol/L (ref 98–111)
Chloride: 104 mmol/L (ref 98–111)
Chloride: 103 mmol/L (ref 98–111)
Chloride: 103 mmol/L (ref 98–111)
Creatinine, Ser: 0.74 mg/dL (ref 0.44–1.00)
Creatinine, Ser: 0.8 mg/dL (ref 0.44–1.00)
Creatinine, Ser: 0.87 mg/dL (ref 0.44–1.00)
Creatinine, Ser: 0.97 mg/dL (ref 0.44–1.00)
Creatinine, Ser: 1.07 mg/dL — ABNORMAL HIGH (ref 0.44–1.00)
Creatinine, Ser: 1.04 mg/dL — ABNORMAL HIGH (ref 0.44–1.00)
GFR calc Af Amer: >60 mL/min (ref >60)
GFR calc Af Amer: >60 mL/min (ref >60)
GFR calc Af Amer: >60 mL/min (ref >60)
GFR calc Af Amer: >60 mL/min (ref >60)
GFR calc Af Amer: 56 mL/min — ABNORMAL LOW (ref >60)
GFR calc Af Amer: 58 mL/min — ABNORMAL LOW (ref >60)
GFR calc non Af Amer: >60 mL/min (ref >60)
GFR calc non Af Amer: >60 mL/min (ref >60)
GFR calc non Af Amer: >60 mL/min (ref >60)
GFR calc non Af Amer: 55 mL/min — ABNORMAL LOW (ref >60)
GFR calc non Af Amer: 49 mL/min — ABNORMAL LOW (ref >60)
GFR calc non Af Amer: 50 mL/min — ABNORMAL LOW (ref >60)
Glucose, Bld: 109 mg/dL — ABNORMAL HIGH (ref 70–99)
Glucose, Bld: 108 mg/dL — ABNORMAL HIGH (ref 70–99)
Glucose, Bld: 117 mg/dL — ABNORMAL HIGH (ref 70–99)
Glucose, Bld: 126 mg/dL — ABNORMAL HIGH (ref 70–99)
Glucose, Bld: 110 mg/dL — ABNORMAL HIGH (ref 70–99)
Glucose, Bld: 100 mg/dL — ABNORMAL HIGH (ref 70–99)
Phosphorus: 2.1 mg/dL — ABNORMAL LOW (ref 2.5–4.6)
Phosphorus: 2 mg/dL — ABNORMAL LOW (ref 2.5–4.6)
Phosphorus: 1.9 mg/dL — ABNORMAL LOW (ref 2.5–4.6)
Phosphorus: 2.6 mg/dL (ref 2.5–4.6)
Phosphorus: 2.5 mg/dL (ref 2.5–4.6)
Phosphorus: 2.2 mg/dL — ABNORMAL LOW (ref 2.5–4.6)
Potassium: 4 mmol/L (ref 3.5–5.1)
Potassium: 4.1 mmol/L (ref 3.5–5.1)
Potassium: 4 mmol/L (ref 3.5–5.1)
Potassium: 4.1 mmol/L (ref 3.5–5.1)
Potassium: 4.4 mmol/L (ref 3.5–5.1)
Potassium: 4.3 mmol/L (ref 3.5–5.1)
Sodium: 137 mmol/L (ref 135–145)
Sodium: 138 mmol/L (ref 135–145)
Sodium: 137 mmol/L (ref 135–145)
Sodium: 138 mmol/L (ref 135–145)
Sodium: 137 mmol/L (ref 135–145)
Sodium: 136 mmol/L (ref 135–145)

## 2019-01-01 LAB — CBC WITH DIFFERENTIAL/PLATELET
Abs Immature Granulocytes: 0.09 10*3/uL — ABNORMAL HIGH (ref 0.00–0.07)
Basophils Absolute: 0 10*3/uL (ref 0.0–0.1)
Basophils Relative: 0 %
Eosinophils Absolute: 0.1 10*3/uL (ref 0.0–0.5)
Eosinophils Relative: 0 %
HCT: 26.3 % — ABNORMAL LOW (ref 36.0–46.0)
Hemoglobin: 7.8 g/dL — ABNORMAL LOW (ref 12.0–15.0)
Immature Granulocytes: 1 %
Lymphocytes Relative: 4 %
Lymphs Abs: 0.5 10*3/uL — ABNORMAL LOW (ref 0.7–4.0)
MCH: 26.8 pg (ref 26.0–34.0)
MCHC: 29.7 g/dL — ABNORMAL LOW (ref 30.0–36.0)
MCV: 90.4 fL (ref 80.0–100.0)
Monocytes Absolute: 0.9 10*3/uL (ref 0.1–1.0)
Monocytes Relative: 8 %
Neutro Abs: 9.9 10*3/uL — ABNORMAL HIGH (ref 1.7–7.7)
Neutrophils Relative %: 87 %
Platelets: 131 10*3/uL — ABNORMAL LOW (ref 150–400)
RBC: 2.91 MIL/uL — ABNORMAL LOW (ref 3.87–5.11)
RDW: 17.7 % — ABNORMAL HIGH (ref 11.5–15.5)
WBC: 11.5 10*3/uL — ABNORMAL HIGH (ref 4.0–10.5)
nRBC: 0 % (ref 0.0–0.2)

## 2019-01-01 LAB — MAGNESIUM
Magnesium: 1.9 mg/dL (ref 1.7–2.4)
Magnesium: 1.8 mg/dL (ref 1.7–2.4)
Magnesium: 1.9 mg/dL (ref 1.7–2.4)
Magnesium: 1.8 mg/dL (ref 1.7–2.4)
Magnesium: 1.8 mg/dL (ref 1.7–2.4)
Magnesium: 1.8 mg/dL (ref 1.7–2.4)

## 2019-01-01 LAB — HEPARIN LEVEL (UNFRACTIONATED)
Heparin Unfractionated: 0.3 IU/mL (ref 0.30–0.70)
Heparin Unfractionated: 0.32 IU/mL (ref 0.30–0.70)

## 2019-01-01 MED ORDER — ACETAMINOPHEN 10 MG/ML IV SOLN
1000.0000 mg | Freq: Three times a day (TID) | INTRAVENOUS | Status: AC
  Administered 2019-01-01 – 2019-01-02 (×3): 1000 mg via INTRAVENOUS
  Filled 2019-01-01 (×3): qty 100

## 2019-01-01 MED ORDER — SODIUM CHLORIDE 0.9 % IV SOLN
2.0000 g | INTRAVENOUS | Status: DC
  Administered 2019-01-01: 2 g via INTRAVENOUS
  Filled 2019-01-01: qty 2
  Filled 2019-01-01: qty 20

## 2019-01-01 MED ORDER — ALBUMIN HUMAN 25 % IV SOLN
12.5000 g | Freq: Every day | INTRAVENOUS | Status: AC
  Administered 2019-01-01 – 2019-01-03 (×3): 12.5 g via INTRAVENOUS
  Filled 2019-01-01 (×5): qty 50

## 2019-01-01 NOTE — Progress Notes (Signed)
Patient continued on CRRT throughout shift, changed cartridge when it expired. Patient's blood rinsed back and CRRT restarted with no incident. Patient somewhat confused, sometimes said appropriate things but often forgot hip was broken or had some mild hallucinations (saw people and a dog in the room that were not there). Patient's oxygen requirements fluctuated throughout the shift, coughing and mouth breathing inhibited ability to titrate oxygen down.  Had a congested and productive cough intermittently throughout shift but in afternoon had more intensive coughing after midodrine administration and coughed water out of her nose. Per daughter patient tends to only drink thicker liquids at home. Hinton Dyer NP notified and she placed order for swallow evaluation by speech therapy.

## 2019-01-01 NOTE — Progress Notes (Signed)
Pharmacy Antibiotic Note  [name] is a 85 y.o. female admitted on 12/25/2018 with pneumonia.  Pharmacy has been consulted for ceftriaxone dosing.  Plan: Will start ceftriaxone 2g IV daily for supposed CAP pnuemonia; however, no PCT to assess if this is true infx; patient not currently meeting SIRS criteria; but Scr is rising, patient still on CRRT--will continue to monitor.  Height: 4\' 11"  (149.9 cm) Weight: 154 lb 15.7 oz (70.3 kg) IBW/kg (Calculated) : 43.2  Temp (24hrs), Avg:97.6 F (36.4 C), Min:96.7 F (35.9 C), Max:98 F (36.7 C)  Recent Labs  Lab 12/27/18 0740 12/28/18 0008  12/30/18 0823  12/31/18 0446  01/01/19 0452 01/01/19 0945 01/01/19 1318 01/01/19 1657 01/01/19 2045  WBC 12.6* 11.1*  --  9.6  --  13.9*  --  11.5*  --   --   --   --   CREATININE 2.22* 2.51*   < > 1.17*   < > 0.62   < > 0.80 0.87 0.97 1.07* 1.04*   < > = values in this interval not displayed.    Estimated Creatinine Clearance: 36.2 mL/min (A) (by C-G formula based on SCr of 1.04 mg/dL (H)).    Allergies  Allergen Reactions  . Ace Inhibitors     Cough  . Doxycycline Nausea Only  . Hydrocodone Itching and Nausea Only  . Mercury     Other reaction(s): Unknown  . Silver Dermatitis    Severe itching  . Cephalexin Rash  . Clindamycin/Lincomycin Rash  . Nickel Rash  . Penicillins Rash    Has patient had a PCN reaction causing immediate rash, facial/tongue/throat swelling, SOB or lightheadedness with hypotension: YES Has patient had a PCN reaction causing severe rash involving mucus membranes or skin necrosis: NO Has patient had a PCN reaction that required hospitalization NO Has patient had a PCN reaction occurring within the last 10 years: NO If all of the above answers are "NO", then may proceed with Cephalosporin use.    Thank you for allowing pharmacy to be a part of this patient's care.  Tobie Lords, PharmD, BCPS Clinical Pharmacist 01/01/2019 9:50 PM

## 2019-01-01 NOTE — Progress Notes (Signed)
Marland Kitchen                                                                                                                                                                              CRITICAL CARE NOTE        SUBJECTIVE FINDINGS & SIGNIFICANT EVENTS    -Tolerating CVVH well, net negative fluid balance.    -Now on home O2 settings.   -h/h stable   -interactive, participating in incentive spirometry and MetaNeb well   PAST MEDICAL HISTORY   Past Medical History:  Diagnosis Date  . Arthritis    "back, hands, knees" (04/10/2016)  . Chronic back pain   . 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 EF 25% by echo in 08/2015 b. RHC in 08/2015 showed normal filling pressures; c. 10/2018 Echo (in setting of atrial tachycardia): EF 25-30%, impaired relaxation. Glob HK. RVSP 50mmHg. Mod dil RA/LA. Mech MV w/ nl fxn (mean grad 62mmHg). Mod TR.   Marland Kitchen Chronic kidney disease   . Compression fracture    "several; all in my back" (04/10/2016)  . GERD (gastroesophageal reflux disease)   . Hypertension   . Lymphedema   . Pacemaker 05/2017  . PAF (paroxysmal atrial fibrillation) ([place])    a. On amio & Coumadin - CHA2DS2VASc = 4.  . S/P MVR (mitral valve replacement)    a. MVR 1994 b. redo MVR in 08/2014 - on Coumadin  . Shortness of breath dyspnea      SURGICAL HISTORY   Past Surgical History:  Procedure Laterality Date  . BREAST LUMPECTOMY Right 2005?   benign lump excision  . CARDIAC CATHETERIZATION    . [place], 2016   "MVR; MVR"  . CARDIOVERSION N/A 11/06/2018   Procedure: CARDIOVERSION;  Surgeon: Minna Merritts, MD;  Location: [facility];  Service: Cardiovascular;  Laterality: N/A;  . CATARACT EXTRACTION W/PHACO Right 02/20/2015   Procedure: CATARACT EXTRACTION PHACO AND INTRAOCULAR LENS PLACEMENT ([place]);  Surgeon: Estill Cotta, MD;  Location: [facility];  Service: Ophthalmology;  Laterality: Right;  US01:31.2 AP  25.3% CDE40.13 Fluid lot # [id number]  . DRESSING CHANGE UNDER ANESTHESIA Left 11/17/2018   Procedure: DRESSING CHANGE;  Surgeon: Robert Bellow, MD;  Location: [facility];  Service: General;  Laterality: Left;  no anesthesia  . ELECTROPHYSIOLOGIC STUDY N/A 10/09/2015   Procedure: CARDIOVERSION;  Surgeon: Wellington Hampshire, MD;  Location: [facility];  Service: Cardiovascular;  Laterality: N/A;  . INCISION AND DRAINAGE OF WOUND Left 11/13/2018   Procedure: LEFT LEG DRESSING CHANGE;  Surgeon: Robert Bellow, MD;  Location: [facility];  Service: General;  Laterality: Left;  . INCISION  AND DRAINAGE OF WOUND Left 11/19/2018   Procedure: LEFT LEG DRESSING CHANGE AND GRAFT APPLICATION;  Surgeon: Robert Bellow, MD;  Location: [facility];  Service: General;  Laterality: Left;  . IR GENERIC HISTORICAL  04/01/2016   IR RADIOLOGIST EVAL & MGMT 04/01/2016 [facility]  . IR GENERIC HISTORICAL  04/15/2016   IR VERTEBROPLASTY CERV/THOR BX INC UNI/BIL INC/INJECT/IMAGING 04/15/2016 Luanne Bras, MD [facility]  . IR GENERIC HISTORICAL  04/15/2016   IR VERTEBROPLASTY EA ADDL (T&LS) BX INC UNI/BIL INC INJECT/IMAGING 04/15/2016 Luanne Bras, MD [facility]  . IR GENERIC HISTORICAL  04/15/2016   IR VERTEBROPLASTY EA ADDL (T&LS) BX INC UNI/BIL INC INJECT/IMAGING 04/15/2016 Luanne Bras, MD [facility]  . IR GENERIC HISTORICAL  05/20/2016   IR RADIOLOGIST EVAL & MGMT 05/20/2016 [facility]  . IR GENERIC HISTORICAL  06/13/2016   IR VERTEBROPLASTY EA ADDL (T&LS) BX INC UNI/BIL INC INJECT/IMAGING 06/13/2016 Luanne Bras, MD [facility]  . IR GENERIC HISTORICAL  06/13/2016   IR SACROPLASTY BILATERAL 06/13/2016 Luanne Bras, MD [facility]  . IR GENERIC HISTORICAL  06/13/2016   IR VERTEBROPLASTY LUMBAR BX INC UNI/BIL INC/INJECT/IMAGING 06/13/2016 Luanne Bras, MD [facility]  . IRRIGATION AND DEBRIDEMENT HEMATOMA Left 07/05/2015   Procedure: IRRIGATION AND DEBRIDEMENT HEMATOMA;   Surgeon: Robert Bellow, MD;  Location: [facility];  Service: General;  Laterality: Left;  . IRRIGATION AND DEBRIDEMENT HEMATOMA Left 11/11/2018   Procedure: IRRIGATION AND DEBRIDEMENT LEFT LEG HEMATOMA;  Surgeon: Robert Bellow, MD;  Location: [facility];  Service: General;  Laterality: Left;  . pyloric stenosis  07/1937  . US ECHOCARDIOGRAPHY       FAMILY HISTORY   Family History  Problem Relation Age of Onset  . Stroke Mother   . Renal Disease Father      SOCIAL HISTORY   Social History   Tobacco Use  . Smoking status: Former Smoker    Packs/day: 1.00    Years: 27.00    Pack years: 27.00    Types: Cigarettes    Quit date: 07/01/1980    Years since quitting: 38.5  . Smokeless tobacco: Never Used  . Tobacco comment: "quit smoking ~ 1980  Substance Use Topics  . Alcohol use: Not Currently    Alcohol/week: 0.0 standard drinks    Comment: 04/10/2016 "I'll have a drink on holidays/special occasions"  . Drug use: No     MEDICATIONS   Current Medication:  Current Facility-Administered Medications:  .  0.9 %  sodium chloride infusion (Manually program via Guardrails IV Fluids), , Intravenous, Once, Ojie, Jude, MD .  0.9 %  sodium chloride infusion (Manually program via Guardrails IV Fluids), , Intravenous, Once, Ottie Glazier, MD .  acetaminophen (TYLENOL) tablet 650 mg, 650 mg, Oral, Q6H PRN, 650 mg at 12/29/18 2216 **OR** acetaminophen (TYLENOL) suppository 650 mg, 650 mg, Rectal, Q6H PRN, Lance Coon, MD .  albuterol (PROVENTIL) (2.5 MG/3ML) 0.083% nebulizer solution 2.5 mg, 2.5 mg, Nebulization, Q2H PRN, Lanney Gins, Gali Spinney, MD .  amiodarone (PACERONE) tablet 200 mg, 200 mg, Oral, Daily, Ojie, Jude, MD, 200 mg at 12/31/18 0907 .  ascorbic acid 500 mg in sodium chloride 0.9% 50 ml, 500 mg, Intravenous, Daily, Ottie Glazier, MD, Stopped at 12/31/18 2041 .  azithromycin (ZITHROMAX) 500 mg in sodium chloride 0.9 % 250 mL IVPB, 500 mg, Intravenous, Q24H, Ottie Glazier,  MD, Stopped at 12/31/18 1039 .  Chlorhexidine Gluconate Cloth 2 % PADS 6 each, 6 each, Topical, Daily, Ottie Glazier, MD, 6 each  at 12/31/18 0919 .  cholecalciferol (VITAMIN D3) tablet 2,000 Units, 2,000 Units, Oral, Daily, Ojie, Jude, MD, 2,000 Units at 12/31/18 0906 .  digoxin (LANOXIN) 0.25 MG/ML injection 0.125 mg, 0.125 mg, Intravenous, Daily, Lanney Gins, Marcellina Jonsson, MD, 0.125 mg at 12/31/18 0906 .  feeding supplement (ENSURE ENLIVE) (ENSURE ENLIVE) liquid 237 mL, 237 mL, Oral, BID BM, Ojie, Jude, MD, 237 mL at 12/28/18 1543 .  heparin ADULT infusion 100 units/mL (25000 units/23mL sodium chloride 0.45%), 1,250 Units/hr, Intravenous, Continuous, Charlett Nose, RPH, Last Rate: 12.5 mL/hr at 01/01/19 0700, 1,250 Units/hr at 01/01/19 0700 .  heparin injection 1,000-6,000 Units, 1,000-6,000 Units, CRRT, PRN, Lateef, Munsoor, MD .  ipratropium-albuterol (DUONEB) 0.5-2.5 (3) MG/3ML nebulizer solution 3 mL, 3 mL, Nebulization, TID, Lanney Gins, Moreen Piggott, MD, 3 mL at 12/31/18 1511 .  midodrine (PROAMATINE) tablet 5 mg, 5 mg, Oral, TID WC, Tine Mabee, MD, 5 mg at 12/31/18 1801 .  multivitamin-lutein (OCUVITE-LUTEIN) capsule 1 capsule, 1 capsule, Oral, Daily, Ojie, Jude, MD, 1 capsule at 12/31/18 1230 .  norepinephrine (LEVOPHED) 4mg  in 254mL premix infusion, 0-40 mcg/min, Intravenous, Titrated, Bradly Bienenstock, NP, Stopped at 12/31/18 1543 .  ondansetron (ZOFRAN) tablet 4 mg, 4 mg, Oral, Q6H PRN **OR** ondansetron (ZOFRAN) injection 4 mg, 4 mg, Intravenous, Q6H PRN, Lance Coon, MD .  polyethylene glycol (MIRALAX / GLYCOLAX) packet 17 g, 17 g, Oral, Daily, Ojie, Jude, MD .  pureflow IV solution for Dialysis, , CRRT, Continuous, Lateef, Munsoor, MD, Last Rate: 1,500 mL/hr at 01/01/19 0316, 3 each at 01/01/19 0316 .  thiamine (B-1) injection 100 mg, 100 mg, Intravenous, Daily, Lanney Gins, Wilfred Dayrit, MD, 100 mg at 12/31/18 0906 .  traMADol (ULTRAM) tablet 50 mg, 50 mg, Oral, Q12H PRN, Stark Jock, Jude, MD, 50 mg at  12/31/18 0906 .  traZODone (DESYREL) tablet 75 mg, 75 mg, Oral, QHS PRN, Bradly Bienenstock, NP    ALLERGIES   Ace inhibitors, Doxycycline, Hydrocodone, Mercury, Silver, Cephalexin, Clindamycin/lincomycin, Nickel, and Penicillins    REVIEW OF SYSTEMS     10 point ROS done and is negative except for R hip pain and mild aggitation.   PHYSICAL EXAMINATION   Vitals:   01/01/19 0600 01/01/19 0700  BP: (!) 112/101 (!) 125/109  Pulse: 66 83  Resp: (!) 24 (!) 21  Temp:    SpO2: 99% 100%    GENERAL:Mild distress due to restlessness HEAD: Normocephalic, atraumatic.  EYES: Pupils equal, round, reactive to light.  No scleral icterus.  MOUTH: Moist mucosal membrane. NECK: Supple. No thyromegaly. No nodules. No JVD.  PULMONARY:crackles at bases bilaterally CARDIOVASCULAR: S1 and S2. Regular rate and rhythm. No murmurs, rubs, or gallops.  GASTROINTESTINAL: Soft, nontender, non-distended. No masses. Positive bowel sounds. No hepatosplenomegaly.  MUSCULOSKELETAL: Lower extremity edema with previous left extremity hematoma and now right hip fracture with serosanguineous oozing from [facility] on the right NEUROLOGIC: Mild distress due to acute illness SKIN:intact,warm,dry   LABS AND IMAGING       LAB RESULTS: Recent Labs  Lab 12/31/18 2110 01/01/19 0110 01/01/19 0452  NA 136 137 138  K 3.9 4.0 4.1  CL 104 103 104  CO2 25 26 26   BUN 16 16 16   CREATININE 0.86 0.74 0.80  GLUCOSE 116* 109* 108*   Recent Labs  Lab 12/30/18 0823  12/30/18 2343 12/31/18 0446 01/01/19 0452  HGB 7.4*   < > 7.8* 7.8* 7.8*  HCT 24.4*   < > 25.6* 25.5* 26.3*  WBC 9.6  --   --  13.9* 11.5*  PLT 122*  --   --  142* 131*   < > = values in this interval not displayed.     IMAGING RESULTS: Dg Chest Port 1 View  Result Date: 12/31/2018 CLINICAL DATA:  Shortness of breath. EXAM: PORTABLE CHEST 1 VIEW COMPARISON:  Multiple prior chest films. The most recent is 12/28/2018 FINDINGS: Persistent diffuse  interstitial and airspace process in the lungs along with bilateral pleural effusions. The heart remains enlarged. The pacer wires are stable. IMPRESSION: Persistent diffuse interstitial and airspace process in the lungs along with small bilateral pleural effusions and bibasilar atelectasis. Electronically Signed   By: Marijo Sanes M.D.   On: 12/31/2018 08:13      ASSESSMENT AND PLAN    -Multidisciplinary rounds held today  Acute on chronic hypoxemic respiratory failure -Due tochronicsystolic CHF with EF 25 to 30% -Cardiology on case appreciate input-follow recommendations -Currently being diuresed by nephrology  -Complicated byacute blood loss anemia, pulmonary edema, CKD, atelectasis, deconditioning, R hip fracture -repeat CXR today - MetaNEB BID today   Chronic HFrEF with EF 70-17%  -Complicated by AF and mechMV requiring AC - discussed with cardiology Dr End - concern for high risk for acute CVA without AC - on Heparin gtt tolerating acceptably   Acute blood loss anemia   - due to R hip fracture and hematoma formation    - s/p 3 units prbc transfusion     - h/h borderline low, q4h h/h monitoring , transfuse as needed    Rt hip fracture      -discussed case and care plan with  Dr Marry Guan and anesthesiology Dr Janett Billow - appreciate input    - high risk patient holding plan for surgery until further eval    Pulmonary pre-operative evaluation  ARISCAT ([place]) preoperative pulmonary risk index in adults- High risk - 42.1% chance of any pulmonary complication postoperatively           -Mainly due to advanced age, baseline chronic hypoxemia, preoperative anemia, significant comorbid conditions  [place] respiratory failure index - moderate-high risk risk- 10.1% chance of post operative respiratory failure requiring MV >48h  GUPTA calculator for postoperative respiratory failure - moderate risk - 4.03% chance of postoperative respiratory failure requiring mechanical  ventilation over 48 hours   Acute on chronic renal insufficiency -Renal function improved post CVVH -Likely cardiorenal in origin -creatinine normalized on cvvh    Bilateral atelectasis -Contributing to hypoxemia - chest PT - MetaNeb BID - Incentive spirometer to be used each hour multiple times - please encourage to use  - patient has difficulty expectorating phlegm - will ask RT to bring Acapella device as well    Moderate protein calorie malnutrition and deconditioning -Contributing to dyspnea -Patient working with physical therapy was able to get out of bed and slowly walk today but encouraged to continue this as well as outpatient -Nutritional assessment patient has been aspirating and has been evaluated with modified barium swallow recommended aspiration precautions-s/p dietician evaluation - appreciate input     GI/Nutrition GI PROPHYLAXIS as indicated DIET-->TF's as tolerated Constipation protocol as indicated  ENDO - ICU hypoglycemic\Hyperglycemia protocol -check FSBS per protocol   ELECTROLYTES -follow labs as needed -replace as needed -pharmacy consultation   DVT/GI PRX ordered -SCDs  TRANSFUSIONS AS NEEDED MONITOR FSBS ASSESS the need for LABS as needed   Critical care provider statement:    Critical care time (minutes):  32   Critical care time was exclusive of:  Separately billable procedures and treating other  patients   Critical care was necessary to treat or prevent imminent or life-threatening deterioration of the following conditions:  acute hypoxemic respiratory failure, AKI, acute blood loss anemia, Rt hip fracture, multiple comorbid conditoins   Critical care was time spent personally by me on the following activities:  Development of treatment plan with patient or surrogate, discussions with consultants, evaluation of patient's response to treatment, examination of patient, obtaining history from patient or surrogate, ordering and  performing treatments and interventions, ordering and review of laboratory studies and re-evaluation of patient's condition.  I assumed direction of critical care for this patient from another provider in my specialty: no    This document was prepared using Dragon voice recognition software and may include unintentional dictation errors.    Ottie Glazier, M.D.  Division of [place]

## 2019-01-01 NOTE — Progress Notes (Signed)
ANTICOAGULATION CONSULT NOTE  Pharmacy Consult for Heparin Drip Indication: mechanical Mitral Valve/A.fib  Allergies  Allergen Reactions  . Ace Inhibitors     Cough  . Doxycycline Nausea Only  . Hydrocodone Itching and Nausea Only  . Mercury     Other reaction(s): Unknown  . Silver Dermatitis    Severe itching  . Cephalexin Rash  . Clindamycin/Lincomycin Rash  . Nickel Rash  . Penicillins Rash    Has patient had a PCN reaction causing immediate rash, facial/tongue/throat swelling, SOB or lightheadedness with hypotension: YES Has patient had a PCN reaction causing severe rash involving mucus membranes or skin necrosis: NO Has patient had a PCN reaction that required hospitalization NO Has patient had a PCN reaction occurring within the last 10 years: NO If all of the above answers are "NO", then may proceed with Cephalosporin use.    Patient Measurements: Height: 4\' 11"  (149.9 cm) Weight: 153 lb 3.5 oz (69.5 kg) IBW/kg (Calculated) : 43.2 Heparin Dosing Weight: 57.9kg  Vital Signs: Temp: 97.5 F (36.4 C) (07/03 0000) Temp Source: Axillary (07/03 0000) BP: 129/66 (07/03 0300) Pulse Rate: 103 (07/03 0300)  Labs: Recent Labs    12/29/18 2154  12/30/18 0823  12/30/18 1633 12/30/18 2343 12/31/18 0446  12/31/18 1527 12/31/18 1719 12/31/18 2110 01/01/19 0110  HGB  --    < > 7.4*   < > 7.5* 7.8* 7.8*  --   --   --   --   --   HCT  --    < > 24.4*   < > 24.7* 25.6* 25.5*  --   --   --   --   --   PLT  --   --  122*  --   --   --  142*  --   --   --   --   --   APTT 57*  --   --   --   --   --   --   --   --   --   --   --   LABPROT 15.1  --   --   --   --   --   --   --   --   --   --   --   INR 1.2  --   --   --   --   --   --   --   --   --   --   --   HEPARINUNFRC  --    < > 0.12*  --  0.26*  --   --   --  0.18*  --   --  0.30  CREATININE 1.82*   < > 1.17*   < > 0.73 0.62 0.62   < >  --  0.66 0.86 0.74   < > = values in this interval not displayed.    Estimated  Creatinine Clearance: 46.8 mL/min (by C-G formula based on SCr of 0.74 mg/dL).   Medical History: Past Medical History:  Diagnosis Date  . Arthritis    "back, hands, knees" (04/10/2016)  . Chronic back pain   . 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 EF 25% by echo in 08/2015 b. RHC in 08/2015 showed normal filling pressures; c. 10/2018 Echo (in setting of atrial tachycardia): EF 25-30%, impaired relaxation. Glob HK. RVSP 61mmHg. Mod dil RA/LA. Mech MV w/ nl fxn (mean grad 31mmHg). Mod TR.   Marland Kitchen  Chronic kidney disease   . Compression fracture    "several; all in my back" (04/10/2016)  . GERD (gastroesophageal reflux disease)   . Hypertension   . Lymphedema   . Pacemaker 05/2017  . PAF (paroxysmal atrial fibrillation) ([place])    a. On amio & Coumadin - CHA2DS2VASc = 4.  . S/P MVR (mitral valve replacement)    a. MVR 1994 b. redo MVR in 08/2014 - on Coumadin  . Shortness of breath dyspnea     Medications:    Assessment: Closed right hip fracture secondary to mechanical fall  Coagulopathy was reversed with vitamin K and INR down to 1.2 on 6/28.-Patient on Coumadin prior to admission Paroxysmal atrial fibrillation and history of mechanical mitral valve; AV paced Rate controlled. Given history of mechanical mitral valve, patient was previously started on Heparin on 6/28 but was stopped on 6/29 due to significant drop in hemoglobin(7.8>6.1). Patient has since received PRBC transfusion and Hgb is currently 7.2. Per Dr. Saunders Revel, it is safe to restart heparin drip and has placed the consult for Pharmacy to re-initiate   Goal of Therapy:  Heparin level 0.3-0.7 units/ml Monitor platelets  Plan: 07/03 @ 0100 HL 0.30 therapeutic. Will continue current rate and will recheck HL @ 0900, Hgb/Hct have been low stable, patient to NOT receive bolusing d/t bleeding risk past h/o of calf hematoma w/ warfarin use and low Hgb, HL's being drawn from central  line because patient refuses sticks. Will continue to monitor.  Pharmacy will continue to monitor and adjust per consult.   Tobie Lords, PharmD, BCPS Clinical Pharmacist 01/01/2019 3:36 AM

## 2019-01-01 NOTE — Progress Notes (Signed)
ANTICOAGULATION CONSULT NOTE  Pharmacy Consult for Heparin Drip Indication: mechanical Mitral Valve/A.fib  Allergies  Allergen Reactions  . Ace Inhibitors     Cough  . Doxycycline Nausea Only  . Hydrocodone Itching and Nausea Only  . Mercury     Other reaction(s): Unknown  . Silver Dermatitis    Severe itching  . Cephalexin Rash  . Clindamycin/Lincomycin Rash  . Nickel Rash  . Penicillins Rash    Has patient had a PCN reaction causing immediate rash, facial/tongue/throat swelling, SOB or lightheadedness with hypotension: YES Has patient had a PCN reaction causing severe rash involving mucus membranes or skin necrosis: NO Has patient had a PCN reaction that required hospitalization NO Has patient had a PCN reaction occurring within the last 10 years: NO If all of the above answers are "NO", then may proceed with Cephalosporin use.    Patient Measurements: Height: 4\' 11"  (149.9 cm) Weight: 154 lb 15.7 oz (70.3 kg) IBW/kg (Calculated) : 43.2 Heparin Dosing Weight: 57.9kg  Vital Signs: Temp: 96.7 F (35.9 C) (07/03 1200) Temp Source: Axillary (07/03 1200) BP: 110/62 (07/03 1700) Pulse Rate: 70 (07/03 1700)  Labs: Recent Labs    12/29/18 2154  12/30/18 0823  12/30/18 2343 12/31/18 0446  12/31/18 1527  01/01/19 0110 01/01/19 0452 01/01/19 0945 01/01/19 1318  HGB  --    < > 7.4*   < > 7.8* 7.8*  --   --   --   --  7.8*  --   --   HCT  --    < > 24.4*   < > 25.6* 25.5*  --   --   --   --  26.3*  --   --   PLT  --   --  122*  --   --  142*  --   --   --   --  131*  --   --   APTT 57*  --   --   --   --   --   --   --   --   --   --   --   --   LABPROT 15.1  --   --   --   --   --   --   --   --   --   --   --   --   INR 1.2  --   --   --   --   --   --   --   --   --   --   --   --   HEPARINUNFRC  --   --  0.12*   < >  --   --   --  0.18*  --  0.30  --  0.32  --   CREATININE 1.82*   < > 1.17*   < > 0.62 0.62   < >  --    < > 0.74 0.80 0.87 0.97   < > = values in this  interval not displayed.    Estimated Creatinine Clearance: 38.8 mL/min (by C-G formula based on SCr of 0.97 mg/dL).   Medical History: Past Medical History:  Diagnosis Date  . Arthritis    "back, hands, knees" (04/10/2016)  . Chronic back pain   . 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 EF 25% by echo in 08/2015 b. RHC in 08/2015 showed normal filling pressures; c. 10/2018 Echo (in setting  of atrial tachycardia): EF 25-30%, impaired relaxation. Glob HK. RVSP 48mmHg. Mod dil RA/LA. Mech MV w/ nl fxn (mean grad 29mmHg). Mod TR.   Marland Kitchen Chronic kidney disease   . Compression fracture    "several; all in my back" (04/10/2016)  . GERD (gastroesophageal reflux disease)   . Hypertension   . Lymphedema   . Pacemaker 05/2017  . PAF (paroxysmal atrial fibrillation) ([place])    a. On amio & Coumadin - CHA2DS2VASc = 4.  . S/P MVR (mitral valve replacement)    a. MVR 1994 b. redo MVR in 08/2014 - on Coumadin  . Shortness of breath dyspnea     Medications:    Assessment: Closed right hip fracture secondary to mechanical fall  Coagulopathy was reversed with vitamin K and INR down to 1.2 on 6/28.-Patient on Coumadin prior to admission Paroxysmal atrial fibrillation and history of mechanical mitral valve; AV paced Rate controlled. Given history of mechanical mitral valve, patient was previously started on Heparin on 6/28 but was stopped on 6/29 due to significant drop in hemoglobin(7.8>6.1). Patient has since received PRBC transfusion and Hgb is currently 7.2. Per Dr. Saunders Revel, it is safe to restart heparin drip and has placed the consult for Pharmacy to re-initiate   Goal of Therapy:  Heparin level 0.3-0.7 units/ml Monitor platelets  Plan: Anti-Xa levels may be obtained via central line. Patient continuing to refuse all sticks Patient should not receive heparin bolus due to clinical course including recent blood transfusions.   Continue heparin at  1250 units/hr. Will obtain electrolytes with am labs.   Pharmacy will continue to monitor and adjust per consult.   Rosangela Fehrenbach L, RPh 01/01/2019 5:05 PM

## 2019-01-01 NOTE — Progress Notes (Signed)
Pt has had better night, only intermittently restless, however has not slept at all this shift. Has spent most of night looking at ceiling or around room, with intermittent calling out for [name], her daughter. No urine output this shift. Pad to right thigh changed twice due to weeping. Sitter at bedside throughout shift for safety.

## 2019-01-01 NOTE — Progress Notes (Signed)
CRRT Medication Adjustments:    Pharmacy consulted for medication adjustments for 84 yo female initiated on CRRT on 6/30. No adjustments warranted at this time.   Pharmacy will continue to monitor and adjust per consult.   Simpson,[name] L,  01/01/2019 5:05 PM

## 2019-01-01 NOTE — Progress Notes (Signed)
[facility] [place] NAME: [name]    MR#:  [id number]  DATE OF BIRTH:  06/14/1935  SUBJECTIVE:  CHIEF COMPLAINT:   Chief Complaint  Patient presents with   Fall   Hip Pain   No new complaint reported this morning.   Patient appears more awake and alert this morning.  Still pleasantly confused.  Son at bedside.  Updated.  REVIEW OF SYSTEMS:  Unobtainable due to underlying medical conditions.  DRUG ALLERGIES:   Allergies  Allergen Reactions   Ace Inhibitors     Cough   Doxycycline Nausea Only   Hydrocodone Itching and Nausea Only   Mercury     Other reaction(s): Unknown   Silver Dermatitis    Severe itching   Cephalexin Rash   Clindamycin/Lincomycin Rash   Nickel Rash   Penicillins Rash    Has patient had a PCN reaction causing immediate rash, facial/tongue/throat swelling, SOB or lightheadedness with hypotension: YES Has patient had a PCN reaction causing severe rash involving mucus membranes or skin necrosis: NO Has patient had a PCN reaction that required hospitalization NO Has patient had a PCN reaction occurring within the last 10 years: NO If all of the above answers are "NO", then may proceed with Cephalosporin use.   VITALS:  Blood pressure (!) 118/99, pulse 76, temperature (!) 96.7 F (35.9 C), temperature source Axillary, resp. rate (!) 23, height 4\' 11"  (1.499 m), weight 70.3 kg, SpO2 94 %. PHYSICAL EXAMINATION:   Physical Exam  Constitutional: She is well-developed, well-nourished, and in no distress.  HENT:  Head: Normocephalic and atraumatic.  Right Ear: External ear normal.  Eyes: Pupils are equal, round, and reactive to light. Conjunctivae are normal. Right eye exhibits no discharge.  Neck: Normal range of motion. Neck supple. No thyromegaly present.  Cardiovascular: Normal rate.  Irregularly irregular  Pulmonary/Chest: Effort normal and breath sounds normal. No respiratory  distress.  Abdominal: Soft. Bowel sounds are normal. She exhibits no distension.  Musculoskeletal:        General: No edema.     Comments: Slight discomfort and ecchymosis around right hip fracture site  Neurological:  Pleasantly confused  Skin: Skin is warm and dry. She is not diaphoretic. No erythema.  Psychiatric: Affect and judgment normal.   LABORATORY PANEL:  Female CBC Recent Labs  Lab 01/01/19 0452  WBC 11.5*  HGB 7.8*  HCT 26.3*  PLT 131*   ------------------------------------------------------------------------------------------------------------------ Chemistries  Recent Labs  Lab 12/25/18 2229  01/01/19 1318  NA 138   < > 138  K 4.1   < > 4.1  CL 96*   < > 104  CO2 31   < > 23  GLUCOSE 139*   < > 126*  BUN 50*   < > 19  CREATININE 1.65*   < > 0.97  CALCIUM 9.4   < > 8.8*  MG  --    < > 1.8  AST 24  --   --   ALT 14  --   --   ALKPHOS 107  --   --   BILITOT 0.8  --   --    < > = values in this interval not displayed.   RADIOLOGY:  No results found. ASSESSMENT AND PLAN:     1. Closed right hip fracture secondary to mechanical fall  Patient appears to have some swelling around her right hip. Patient seen by orthopedic service. Coagulopathy was reversed with vitamin  K and INR down to 1.2. Patient seen by cardiologist.  Patient is chronically very high risk for any surgical procedures.  No further cardiovascular risk assessment will be beneficial however.  Patient transfused with 2 units of packed red blood cells previously  and hemoglobin improved to 7.8. Patient became hypoxic and hypotensive and had to be transferred to ICU Surgery currently on hold due to multiple medical problems listed below  2.  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  Seen by nephrologist and patient started on Lasix drip low dose due to blood pressure being borderline.  Midodrine was also added to help  maintain blood pressure while ongoing diuresis. Patient also required Levophed drip to maintain blood pressure. Patient also requiring CRRT by nephrology.  3.  Paroxysmal atrial fibrillation and history of mechanical mitral valve; AV paced Rate controlled.  Resumed amiodarone.  Blood pressure being borderline, limiting resumption of some of patient's home medications. Heparin drip discontinued recently due to further drop in hemoglobin requiring more packed red blood cell transfusions.  Hemoglobin stable at 7.8 this morning. Heparin drip resumed already to prevent CVA   4.  Acute on chronic hypoxic respiratory failure Patient had to be transferred to ICU recently due to hypoxia requiring up to 6 L of oxygen via nasal cannula.  Patient uses 3 L of oxygen at home at baseline.  5.  Acute blood loss anemia Hemoglobin stable at 7.8 this morning following packed red blood cell transfusion  6.  Acute kidney injury superimposed on chronic kidney disease stage III Nephrology already following and assisting with management. Patient started on CRRT and tolerating well.  7.  Left lower extremity chronic wounds. Being managed by surgical service.  DVT prophylaxis; On heparin drip   Patient seen by palliative care team.  Patient now DNR. CODE STATUS: DNR  TOTAL TIME TAKING CARE OF THIS PATIENT: 35 minutes.   More than 50% of the time was spent in counseling/coordination of care: YES  POSSIBLE D/C IN 2-3 DAYS, DEPENDING ON CLINICAL CONDITION.   Kason Benak M.D on 01/01/2019 at 3:13 PM  Between 7am to 6pm - Pager - [phone]  After 6pm go to [email or web address] - Proofreader  [facility]  Office  [phone]  CC: Primary care physician; Rusty Aus, MD  Note: This dictation was prepared with Dragon dictation along with smaller phrase technology. Any transcriptional errors that result from this process are unintentional.

## 2019-01-01 NOTE — Progress Notes (Signed)
[facility], [place] 01/01/19  Subjective:  Patient continues to tolerate CRRT well. Ultrafiltration achieved yesterday was 1.3 L. However remains anuric at the moment.   Objective:  Vital signs in last 24 hours:  Temp:  [96.7 F (35.9 C)-97.8 F (36.6 C)] 96.7 F (35.9 C) (07/03 1200) Pulse Rate:  [64-110] 73 (07/03 1305) Resp:  [17-37] 32 (07/03 1305) BP: (81-137)/(41-109) 106/78 (07/03 1300) SpO2:  [79 %-100 %] 99 % (07/03 1305) FiO2 (%):  [47 %-48 %] 48 % (07/03 0800) Weight:  [70.3 kg] 70.3 kg (07/03 0500)  Weight change: 0.8 kg Filed Weights   12/30/18 0500 12/31/18 0447 01/01/19 0500  Weight: 69.5 kg 69.5 kg 70.3 kg    Intake/Output:    Intake/Output Summary (Last 24 hours) at 01/01/2019 1348 Last data filed at 01/01/2019 1300 Gross per 24 hour  Intake 825.81 ml  Output 1395 ml  Net -569.19 ml     Physical Exam: General:  Frail elderly lady, critically ill-appearing  HEENT  moist oral mucous membranes  Neck  supple no JVD  Pulm/lungs  bibasilar rales  CVS/Heart  irregular, 3/6 SEM  Abdomen:   Soft, nontender  Extremities:  trace LE edema, right foot externally rotated  Neurologic:  Awake, alert  Skin:  Right leg ecchymosis, resolving hematoma    Basic Metabolic Panel:  Recent Labs  Lab 12/31/18 1719 12/31/18 2110 01/01/19 0110 01/01/19 0452 01/01/19 0945 01/01/19 1318  NA 138 136 137 138 137 138  K 3.9 3.9 4.0 4.1 4.0 4.1  CL 104 104 103 104 103 104  CO2 27 25 26 26 26 23   GLUCOSE 113* 116* 109* 108* 117* 126*  BUN 16 16 16 16 17 19   CREATININE 0.66 0.86 0.74 0.80 0.87 0.97  CALCIUM 8.7* 8.7* 8.5* 8.8* 9.0 8.8*  MG 1.8 1.8 1.9 1.8 1.9  --   PHOS 2.6 2.2* 2.1* 2.0* 1.9* 2.6     CBC: Recent Labs  Lab 12/25/18 2229  12/27/18 0740 12/28/18 0008  12/30/18 0823 12/30/18 1306 12/30/18 1633 12/30/18 2343 12/31/18 0446 01/01/19 0452  WBC 6.3   < > 12.6* 11.1*  --  9.6  --   --   --  13.9* 11.5*  NEUTROABS  4.7  --   --   --   --   --   --   --   --   --  9.9*  HGB 8.0*   < > 7.3* 6.1*   < > 7.4* 7.3* 7.5* 7.8* 7.8* 7.8*  HCT 27.2*   < > 23.7* 19.9*   < > 24.4* 24.0* 24.7* 25.6* 25.5* 26.3*  MCV 82.7   < > 85.6 84.7  --  87.8  --   --   --  87.3 90.4  PLT 182   < > 182 170  --  122*  --   --   --  142* 131*   < > = values in this interval not displayed.     No results found for: HEPBSAG, HEPBSAB, HEPBIGM    Microbiology:  Recent Results (from the past 240 hour(s))  SARS Coronavirus 2 (CEPHEID- Performed in [place] hospital lab), Hosp Order     Status: None   Collection Time: 12/26/18 12:33 AM   Specimen: Nasopharyngeal Swab  Result Value Ref Range Status   SARS Coronavirus 2 NEGATIVE NEGATIVE Final    Comment: (NOTE) If result is NEGATIVE SARS-CoV-2 target nucleic acids are NOT DETECTED. The SARS-CoV-2 RNA is  generally detectable in upper and lower  respiratory specimens during the acute phase of infection. The lowest  concentration of SARS-CoV-2 viral copies this assay can detect is 250  copies / mL. A negative result does not preclude SARS-CoV-2 infection  and should not be used as the sole basis for treatment or other  patient management decisions.  A negative result may occur with  improper specimen collection / handling, submission of specimen other  than nasopharyngeal swab, presence of viral mutation(s) within the  areas targeted by this assay, and inadequate number of viral copies  (<250 copies / mL). A negative result must be combined with clinical  observations, patient history, and epidemiological information. If result is POSITIVE SARS-CoV-2 target nucleic acids are DETECTED. The SARS-CoV-2 RNA is generally detectable in upper and lower  respiratory specimens dur ing the acute phase of infection.  Positive  results are indicative of active infection with SARS-CoV-2.  Clinical  correlation with patient history and other diagnostic information is  necessary to  determine patient infection status.  Positive results do  not rule out bacterial infection or co-infection with other viruses. If result is PRESUMPTIVE POSTIVE SARS-CoV-2 nucleic acids MAY BE PRESENT.   A presumptive positive result was obtained on the submitted specimen  and confirmed on repeat testing.  While 2019 novel coronavirus  (SARS-CoV-2) nucleic acids may be present in the submitted sample  additional confirmatory testing may be necessary for epidemiological  and / or clinical management purposes  to differentiate between  SARS-CoV-2 and other Sarbecovirus currently known to infect humans.  If clinically indicated additional testing with an alternate test  methodology [phone]) is advised. The SARS-CoV-2 RNA is generally  detectable in upper and lower respiratory sp ecimens during the acute  phase of infection. The expected result is Negative. Fact Sheet for Patients:  StrictlyIdeas.no Fact Sheet for Healthcare Providers: BankingDealers.co.za This test is not yet approved or cleared by the [place] FDA and has been authorized for detection and/or diagnosis of SARS-CoV-2 by FDA under an Emergency Use Authorization (EUA).  This EUA will remain in effect (meaning this test can be used) for the duration of the COVID-19 declaration under Section 564(b)(1) of the Act, 21 U.S.C. section 360bbb-3(b)(1), unless the authorization is terminated or revoked sooner. Performed at [facility], [address] [place], [place] [address]   MRSA PCR Screening     Status: None   Collection Time: 12/26/18  1:54 AM   Specimen: Nasal Mucosa; Nasopharyngeal  Result Value Ref Range Status   MRSA by PCR NEGATIVE NEGATIVE Final    Comment:        The GeneXpert MRSA Assay (FDA approved for NASAL specimens only), is one component of a comprehensive MRSA colonization surveillance program. It is not intended to diagnose MRSA infection  nor to guide or monitor treatment for MRSA infections. Performed at [facility], [address] [place], [place] [address]   CULTURE, BLOOD (ROUTINE X 2) w Reflex to ID Panel     Status: None (Preliminary result)   Collection Time: 12/28/18  3:09 PM   Specimen: BLOOD  Result Value Ref Range Status   Specimen Description BLOOD RIGHT ANTECUBITAL  Final   Special Requests   Final    BOTTLES DRAWN AEROBIC AND ANAEROBIC Blood Culture adequate volume   Culture   Final    NO GROWTH 4 DAYS Performed at [facility], [address], [place], [place] [address]    Report Status PENDING  Incomplete  CULTURE, BLOOD (ROUTINE X 2) w Reflex to ID Panel     Status: None (Preliminary result)   Collection Time: 12/28/18  3:19 PM   Specimen: BLOOD  Result Value Ref Range Status   Specimen Description BLOOD BLOOD RIGHT HAND  Final   Special Requests   Final    BOTTLES DRAWN AEROBIC AND ANAEROBIC Blood Culture adequate volume   Culture   Final    NO GROWTH 4 DAYS Performed at [facility], [address] [place], [name] [address]    Report Status PENDING  Incomplete    Coagulation Studies: Recent Labs    12/29/18 October 15, 2152  LABPROT 15.1  INR 1.2    Urinalysis: No results for input(s): COLORURINE, LABSPEC, PHURINE, GLUCOSEU, HGBUR, BILIRUBINUR, KETONESUR, PROTEINUR, UROBILINOGEN, NITRITE, LEUKOCYTESUR in the last 72 hours.  Invalid input(s): APPERANCEUR    Imaging: Dg Chest Port 1 View  Result Date: 12/31/2018 CLINICAL DATA:  Shortness of breath. EXAM: PORTABLE CHEST 1 VIEW COMPARISON:  Multiple prior chest films. The most recent is 12/28/2018 FINDINGS: Persistent diffuse interstitial and airspace process in the lungs along with bilateral pleural effusions. The heart remains enlarged. The pacer wires are stable. IMPRESSION: Persistent diffuse interstitial and airspace process in the lungs along with small bilateral pleural effusions and bibasilar atelectasis.  Electronically Signed   By: Marijo Sanes M.D.   On: 12/31/2018 08:13     Medications:   . albumin human    . ascorbic acid 500 mg in sodium chloride 0.9% 50 ml 0 mg (12/31/18 10-16-39)  . azithromycin Stopped (01/01/19 1142)  . heparin 1,250 Units/hr (01/01/19 1300)  . norepinephrine (LEVOPHED) Adult infusion Stopped (12/31/18 1543)  . pureflow 3 each (01/01/19 0316)   . sodium chloride   Intravenous Once  . sodium chloride   Intravenous Once  . amiodarone  200 mg Oral Daily  . Chlorhexidine Gluconate Cloth  6 each Topical Daily  . cholecalciferol  2,000 Units Oral Daily  . digoxin  0.125 mg Intravenous Daily  . feeding supplement (ENSURE ENLIVE)  237 mL Oral BID BM  . ipratropium-albuterol  3 mL Nebulization TID  . midodrine  5 mg Oral TID WC  . multivitamin-lutein  1 capsule Oral Daily  . polyethylene glycol  17 g Oral Daily  . thiamine injection  100 mg Intravenous Daily   acetaminophen **OR** acetaminophen, albuterol, heparin, ondansetron **OR** ondansetron (ZOFRAN) IV, traMADol, traZODone  Assessment/ Plan:  84 y.o.Caucasian  female with congestive heart failure EF 25%, chronic kidney disease, hypertension, atrial fibrillation, lymphedema, mitral valve replacement (mechanical valve) requiring chronic anticoagulation, COPD, osteoporosis, history of multiple lumbar compression fractures requiring kyphoplasty, history of left leg hematoma with evacuation and porcine allograft, was admitted on 12/25/2018 with right hip fracture.   1.  Acute kidney injury, chronic kidney disease stage III.  Baseline 1.37/GFR 36 from December 15, 2018 2.  Chronic systolic congestive heart failure with EF 25% 3.  Lower extremity edema with third spacing 4.  Pulmonary hypertension 5.  Mechanical heart valve requiring chronic anticoagulation  -Patient anuric at the moment.  Blood pressure currently 106 LAD 78.  Therefore at this time we will maintain the patient on CRRT at current parameters.   Ultrafiltration target will remain at 50 cc/h.  Continue to monitor serum electrolytes closely.  We will continue to monitor her overall progress.  LOS: 7 Taner Rzepka 7/3/20201:48 PM  [place], [place]  Note: This note was prepared with Dragon dictation.  Any transcription errors are unintentional

## 2019-01-02 ENCOUNTER — Inpatient Hospital Stay: Payer: Medicare Other

## 2019-01-02 LAB — RENAL FUNCTION PANEL
Albumin: 3.3 g/dL — ABNORMAL LOW (ref 3.5–5.0)
Albumin: 3.4 g/dL — ABNORMAL LOW (ref 3.5–5.0)
Albumin: 3.1 g/dL — ABNORMAL LOW (ref 3.5–5.0)
Albumin: 3.2 g/dL — ABNORMAL LOW (ref 3.5–5.0)
Albumin: 3.3 g/dL — ABNORMAL LOW (ref 3.5–5.0)
Anion gap: 8 (ref 5–15)
Anion gap: 8 (ref 5–15)
Anion gap: 11 (ref 5–15)
Anion gap: 10 (ref 5–15)
Anion gap: 10 (ref 5–15)
BUN: 23 mg/dL (ref 8–23)
BUN: 24 mg/dL — ABNORMAL HIGH (ref 8–23)
BUN: 25 mg/dL — ABNORMAL HIGH (ref 8–23)
BUN: 25 mg/dL — ABNORMAL HIGH (ref 8–23)
BUN: 29 mg/dL — ABNORMAL HIGH (ref 8–23)
CO2: 26 mmol/L (ref 22–32)
CO2: 25 mmol/L (ref 22–32)
CO2: 25 mmol/L (ref 22–32)
CO2: 25 mmol/L (ref 22–32)
CO2: 24 mmol/L (ref 22–32)
Calcium: 8.8 mg/dL — ABNORMAL LOW (ref 8.9–10.3)
Calcium: 9.2 mg/dL (ref 8.9–10.3)
Calcium: 9 mg/dL (ref 8.9–10.3)
Calcium: 8.9 mg/dL (ref 8.9–10.3)
Calcium: 8.8 mg/dL — ABNORMAL LOW (ref 8.9–10.3)
Chloride: 102 mmol/L (ref 98–111)
Chloride: 104 mmol/L (ref 98–111)
Chloride: 102 mmol/L (ref 98–111)
Chloride: 102 mmol/L (ref 98–111)
Chloride: 106 mmol/L (ref 98–111)
Creatinine, Ser: 1.15 mg/dL — ABNORMAL HIGH (ref 0.44–1.00)
Creatinine, Ser: 1.24 mg/dL — ABNORMAL HIGH (ref 0.44–1.00)
Creatinine, Ser: 1.24 mg/dL — ABNORMAL HIGH (ref 0.44–1.00)
Creatinine, Ser: 1.32 mg/dL — ABNORMAL HIGH (ref 0.44–1.00)
Creatinine, Ser: 1.43 mg/dL — ABNORMAL HIGH (ref 0.44–1.00)
GFR calc Af Amer: 52 mL/min — ABNORMAL LOW (ref >60)
GFR calc Af Amer: 47 mL/min — ABNORMAL LOW
GFR calc Af Amer: 47 mL/min — ABNORMAL LOW
GFR calc Af Amer: 44 mL/min — ABNORMAL LOW
GFR calc Af Amer: 40 mL/min — ABNORMAL LOW
GFR calc non Af Amer: 45 mL/min — ABNORMAL LOW (ref >60)
GFR calc non Af Amer: 41 mL/min — ABNORMAL LOW
GFR calc non Af Amer: 41 mL/min — ABNORMAL LOW
GFR calc non Af Amer: 38 mL/min — ABNORMAL LOW
GFR calc non Af Amer: 34 mL/min — ABNORMAL LOW
Glucose, Bld: 98 mg/dL (ref 70–99)
Glucose, Bld: 109 mg/dL — ABNORMAL HIGH (ref 70–99)
Glucose, Bld: 92 mg/dL (ref 70–99)
Glucose, Bld: 95 mg/dL (ref 70–99)
Glucose, Bld: 86 mg/dL (ref 70–99)
Phosphorus: 2.3 mg/dL — ABNORMAL LOW (ref 2.5–4.6)
Phosphorus: 2.5 mg/dL (ref 2.5–4.6)
Phosphorus: 2.4 mg/dL — ABNORMAL LOW (ref 2.5–4.6)
Phosphorus: 2.3 mg/dL — ABNORMAL LOW (ref 2.5–4.6)
Phosphorus: 2.5 mg/dL (ref 2.5–4.6)
Potassium: 4.4 mmol/L (ref 3.5–5.1)
Potassium: 4.7 mmol/L (ref 3.5–5.1)
Potassium: 4.2 mmol/L (ref 3.5–5.1)
Potassium: 4.2 mmol/L (ref 3.5–5.1)
Potassium: 4 mmol/L (ref 3.5–5.1)
Sodium: 136 mmol/L (ref 135–145)
Sodium: 137 mmol/L (ref 135–145)
Sodium: 138 mmol/L (ref 135–145)
Sodium: 137 mmol/L (ref 135–145)
Sodium: 140 mmol/L (ref 135–145)

## 2019-01-02 LAB — CBC WITH DIFFERENTIAL/PLATELET
Abs Immature Granulocytes: 0.14 K/uL — ABNORMAL HIGH (ref 0.00–0.07)
Basophils Absolute: 0 K/uL (ref 0.0–0.1)
Basophils Relative: 0 %
Eosinophils Absolute: 0 K/uL (ref 0.0–0.5)
Eosinophils Relative: 0 %
HCT: 27.2 % — ABNORMAL LOW (ref 36.0–46.0)
Hemoglobin: 8 g/dL — ABNORMAL LOW (ref 12.0–15.0)
Immature Granulocytes: 1 %
Lymphocytes Relative: 4 %
Lymphs Abs: 0.8 K/uL (ref 0.7–4.0)
MCH: 26.3 pg (ref 26.0–34.0)
MCHC: 29.4 g/dL — ABNORMAL LOW (ref 30.0–36.0)
MCV: 89.5 fL (ref 80.0–100.0)
Monocytes Absolute: 1.3 K/uL — ABNORMAL HIGH (ref 0.1–1.0)
Monocytes Relative: 7 %
Neutro Abs: 16.3 K/uL — ABNORMAL HIGH (ref 1.7–7.7)
Neutrophils Relative %: 88 %
Platelets: 151 K/uL (ref 150–400)
RBC: 3.04 MIL/uL — ABNORMAL LOW (ref 3.87–5.11)
RDW: 18.7 % — ABNORMAL HIGH (ref 11.5–15.5)
WBC: 18.5 K/uL — ABNORMAL HIGH (ref 4.0–10.5)
nRBC: 0.2 % (ref 0.0–0.2)

## 2019-01-02 LAB — MAGNESIUM
Magnesium: 1.9 mg/dL (ref 1.7–2.4)
Magnesium: 2 mg/dL (ref 1.7–2.4)
Magnesium: 1.9 mg/dL (ref 1.7–2.4)
Magnesium: 2 mg/dL (ref 1.7–2.4)
Magnesium: 1.9 mg/dL (ref 1.7–2.4)

## 2019-01-02 LAB — HEPARIN LEVEL (UNFRACTIONATED)
Heparin Unfractionated: 0.19 IU/mL — ABNORMAL LOW (ref 0.30–0.70)
Heparin Unfractionated: 0.39 IU/mL (ref 0.30–0.70)

## 2019-01-02 LAB — CULTURE, BLOOD (ROUTINE X 2)
Culture: NO GROWTH
Culture: NO GROWTH
Special Requests: ADEQUATE
Special Requests: ADEQUATE

## 2019-01-02 MED ORDER — SODIUM CHLORIDE 0.9 % IV SOLN
1.0000 g | Freq: Two times a day (BID) | INTRAVENOUS | Status: DC
  Administered 2019-01-02 – 2019-01-05 (×6): 1 g via INTRAVENOUS
  Filled 2019-01-02 (×7): qty 1

## 2019-01-02 MED ORDER — MELATONIN 5 MG PO TABS
5.0000 mg | ORAL_TABLET | Freq: Every day | ORAL | Status: DC
  Administered 2019-01-04: 5 mg via ORAL
  Filled 2019-01-02 (×4): qty 1

## 2019-01-02 NOTE — Progress Notes (Signed)
CRRT stopped at this time per Dr. Lanney Gins in preparation for transfer to [facility]. Blood rinsed back with no difficulty. Temporary dialysis catheter red and blue ports heparin locked.

## 2019-01-02 NOTE — Progress Notes (Signed)
Daughter has requested no treatments or cpt for this patient tonight.  Wants her to rest.

## 2019-01-02 NOTE — Progress Notes (Signed)
[place] [facility] [place] NAME: [name]    MR#:  [id number]  DATE OF BIRTH:  1934/07/09  SUBJECTIVE:  CHIEF COMPLAINT:   Chief Complaint  Patient presents with  . Fall  . Hip Pain   No new complaint reported this morning.  Patient appears more lethargic this morning.  REVIEW OF SYSTEMS:  Unobtainable due to underlying medical conditions.  DRUG ALLERGIES:   Allergies  Allergen Reactions  . Ace Inhibitors     Cough  . Doxycycline Nausea Only  . Hydrocodone Itching and Nausea Only  . Mercury     Other reaction(s): Unknown  . Silver Dermatitis    Severe itching  . Cephalexin Rash  . Clindamycin/Lincomycin Rash  . Nickel Rash  . Penicillins Rash    Has patient had a PCN reaction causing immediate rash, facial/tongue/throat swelling, SOB or lightheadedness with hypotension: YES Has patient had a PCN reaction causing severe rash involving mucus membranes or skin necrosis: NO Has patient had a PCN reaction that required hospitalization NO Has patient had a PCN reaction occurring within the last 10 years: NO If all of the above answers are "NO", then may proceed with Cephalosporin use.   VITALS:  Blood pressure (!) 121/57, pulse 93, temperature (!) 97.5 F (36.4 C), temperature source Axillary, resp. rate (!) 28, height 4\' 11"  (1.499 m), weight 62.5 kg, SpO2 98 %. PHYSICAL EXAMINATION:   Physical Exam  Constitutional: She is well-developed, well-nourished, and in no distress.  HENT:  Head: Normocephalic and atraumatic.  Right Ear: External ear normal.  Eyes: Pupils are equal, round, and reactive to light. Conjunctivae are normal. Right eye exhibits no discharge.  Neck: Normal range of motion. Neck supple. No thyromegaly present.  Cardiovascular: Normal rate.  Irregularly irregular  Pulmonary/Chest: Effort normal and breath sounds normal. No respiratory distress.  Abdominal: Soft. Bowel sounds are normal. She exhibits no  distension.  Musculoskeletal:        General: No edema.     Comments: Slight discomfort and ecchymosis around right hip fracture site  Neurological:  Pleasantly confused  Skin: Skin is warm and dry. She is not diaphoretic. No erythema.  Psychiatric: Affect and judgment normal.   LABORATORY PANEL:  Female CBC Recent Labs  Lab 01/02/19 0506  WBC 18.5*  HGB 8.0*  HCT 27.2*  PLT 151   ------------------------------------------------------------------------------------------------------------------ Chemistries  Recent Labs  Lab 01/02/19 0926  NA 138  K 4.2  CL 102  CO2 25  GLUCOSE 92  BUN 25*  CREATININE 1.24*  CALCIUM 9.0  MG 1.9   RADIOLOGY:  Dg Chest Port 1 View  Result Date: 01/02/2019 CLINICAL DATA:  Acute respiratory failure. EXAM: PORTABLE CHEST 1 VIEW COMPARISON:  Chest x-ray dated December 31, 2018. FINDINGS: Unchanged left chest wall pacemaker and cardiomegaly. Prior mitral valve replacement. Diffuse bilateral interstitial and airspace disease, mildly improved in the right upper lobe. Unchanged small bilateral pleural effusions. No pneumothorax. No acute osseous abnormality. IMPRESSION: 1. Persistent diffuse bilateral interstitial and airspace disease, mildly improved in the right upper lobe. Electronically Signed   By: Titus Dubin M.D.   On: 01/02/2019 08:40   ASSESSMENT AND PLAN:     1. Closed right hip fracture secondary to mechanical fall  Patient appears to have some swelling around her right hip. Patient seen by orthopedic service. Coagulopathy was reversed with vitamin K and INR down to 1.2. Patient seen by cardiologist.  Patient is chronically very high risk  for any surgical procedures.  No further cardiovascular risk assessment will be beneficial however.  Patient transfused with 2 units of packed red blood cells previously  and hemoglobin improved to 8. Patient became hypoxic and hypotensive and had to be transferred to ICU Surgery currently on hold due to  multiple medical problems listed below  2.  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  Seen by nephrologist and patient started on Lasix drip low dose due to blood pressure being borderline.  Midodrine was also added to help maintain blood pressure while ongoing diuresis. Patient was also requiring Levophed drip to maintain blood pressure. Patient also requiring CRRT by nephrology.  3.  Paroxysmal atrial fibrillation and history of mechanical mitral valve; AV paced Rate controlled.  Resumed amiodarone.  Blood pressure being borderline, limiting resumption of some of patient's home medications. Heparin drip discontinued recently due to further drop in hemoglobin requiring more packed red blood cell transfusions.  Hemoglobin stable at 8 this morning. Heparin drip resumed already to prevent CVA   4.  Acute on chronic hypoxic respiratory failure Patient had to be transferred to ICU recently due to hypoxia requiring up to 6 L of oxygen via nasal cannula.  Patient uses 3 L of oxygen at home at baseline.  5.  Acute blood loss anemia Hemoglobin stable at 8 this morning following packed red blood cell transfusion  6.  Acute kidney injury superimposed on chronic kidney disease stage III Nephrology already following and assisting with management. Patient started on CRRT and tolerating well.  7.  Left lower extremity chronic wounds. Being managed by surgical service.  DVT prophylaxis; On heparin drip   Patient seen by palliative care team.  Patient now DNR. CODE STATUS: DNR  TOTAL TIME TAKING CARE OF THIS PATIENT: 34 minutes.   More than 50% of the time was spent in counseling/coordination of care: YES  POSSIBLE D/C IN 2-3 DAYS, DEPENDING ON CLINICAL CONDITION.   Brett Soza M.D on 01/02/2019 at 12:53 PM  Between 7am to 6pm - Pager - [phone]  After 6pm go to [email or web address] - Proofreader  [facility] [place] [facility]  Office  [phone]  CC: Primary care physician; Rusty Aus, MD  Note: This dictation was prepared with Dragon dictation along with smaller phrase technology. Any transcriptional errors that result from this process are unintentional.

## 2019-01-02 NOTE — Progress Notes (Signed)
[place], [place] 01/02/19  Subjective:  Patient remains critically ill. Has been weaned off of pressors at the moment. Remains on CRRT. Ultrafiltration over the preceding 24 hours was 1.5 L.   Objective:  Vital signs in last 24 hours:  Temp:  [97.5 F (36.4 C)-98.2 F (36.8 C)] 97.5 F (36.4 C) (07/04 1200) Pulse Rate:  [39-116] 93 (07/04 1300) Resp:  [18-33] 23 (07/04 1300) BP: (90-145)/(30-127) 103/30 (07/04 1300) SpO2:  [86 %-100 %] 98 % (07/04 1300) FiO2 (%):  [48 %-50 %] 48 % (07/04 1300) Weight:  [62.5 kg] 62.5 kg (07/04 0500)  Weight change: -7.8 kg Filed Weights   12/31/18 0447 01/01/19 0500 01/02/19 0500  Weight: 69.5 kg 70.3 kg 62.5 kg    Intake/Output:    Intake/Output Summary (Last 24 hours) at 01/02/2019 1421 Last data filed at 01/02/2019 1300 Gross per 24 hour  Intake 860.07 ml  Output 1660 ml  Net -799.93 ml     Physical Exam: General:  Frail elderly lady, critically ill-appearing  HEENT  moist oral mucous membranes  Neck  supple no JVD  Pulm/lungs  bibasilar rales  CVS/Heart  irregular, 3/6 SEM  Abdomen:   Soft, nontender  Extremities:  trace LE edema, right foot externally rotated  Neurologic:  Awake, alert  Skin:  Right leg ecchymosis, resolving hematoma    Basic Metabolic Panel:  Recent Labs  Lab 01/01/19 2045 01/02/19 0152 01/02/19 0506 01/02/19 0926 01/02/19 1227  NA 136 136 137 138 137  K 4.3 4.4 4.7 4.2 4.2  CL 103 102 104 102 102  CO2 24 26 25 25 25   GLUCOSE 100* 98 109* 92 95  BUN 22 23 24* 25* 25*  CREATININE 1.04* 1.15* 1.24* 1.24* 1.32*  CALCIUM 9.0 8.8* 9.2 9.0 8.9  MG 1.8 1.9 2.0 1.9 2.0  PHOS 2.2* 2.3* 2.5 2.4* 2.3*     CBC: Recent Labs  Lab 12/28/18 0008  12/30/18 0823  12/30/18 1633 12/30/18 2343 12/31/18 0446 01/01/19 0452 01/02/19 0506  WBC 11.1*  --  9.6  --   --   --  13.9* 11.5* 18.5*  NEUTROABS  --   --   --   --   --   --   --  9.9* 16.3*  HGB 6.1*   < > 7.4*   <  > 7.5* 7.8* 7.8* 7.8* 8.0*  HCT 19.9*   < > 24.4*   < > 24.7* 25.6* 25.5* 26.3* 27.2*  MCV 84.7  --  87.8  --   --   --  87.3 90.4 89.5  PLT 170  --  122*  --   --   --  142* 131* 151   < > = values in this interval not displayed.     No results found for: HEPBSAG, HEPBSAB, HEPBIGM    Microbiology:  Recent Results (from the past 240 hour(s))  SARS Coronavirus 2 (CEPHEID- Performed in [place] hospital lab), Hosp Order     Status: None   Collection Time: 12/26/18 12:33 AM   Specimen: Nasopharyngeal Swab  Result Value Ref Range Status   SARS Coronavirus 2 NEGATIVE NEGATIVE Final    Comment: (NOTE) If result is NEGATIVE SARS-CoV-2 target nucleic acids are NOT DETECTED. The SARS-CoV-2 RNA is generally detectable in upper and lower  respiratory specimens during the acute phase of infection. The lowest  concentration of SARS-CoV-2 viral copies this assay can detect is 250  copies / mL. A negative result  does not preclude SARS-CoV-2 infection  and should not be used as the sole basis for treatment or other  patient management decisions.  A negative result may occur with  improper specimen collection / handling, submission of specimen other  than nasopharyngeal swab, presence of viral mutation(s) within the  areas targeted by this assay, and inadequate number of viral copies  (<250 copies / mL). A negative result must be combined with clinical  observations, patient history, and epidemiological information. If result is POSITIVE SARS-CoV-2 target nucleic acids are DETECTED. The SARS-CoV-2 RNA is generally detectable in upper and lower  respiratory specimens dur ing the acute phase of infection.  Positive  results are indicative of active infection with SARS-CoV-2.  Clinical  correlation with patient history and other diagnostic information is  necessary to determine patient infection status.  Positive results do  not rule out bacterial infection or co-infection with other  viruses. If result is PRESUMPTIVE POSTIVE SARS-CoV-2 nucleic acids MAY BE PRESENT.   A presumptive positive result was obtained on the submitted specimen  and confirmed on repeat testing.  While 2019 novel coronavirus  (SARS-CoV-2) nucleic acids may be present in the submitted sample  additional confirmatory testing may be necessary for epidemiological  and / or clinical management purposes  to differentiate between  SARS-CoV-2 and other Sarbecovirus currently known to infect humans.  If clinically indicated additional testing with an alternate test  methodology [phone]) is advised. The SARS-CoV-2 RNA is generally  detectable in upper and lower respiratory sp ecimens during the acute  phase of infection. The expected result is Negative. Fact Sheet for Patients:  StrictlyIdeas.no Fact Sheet for Healthcare Providers: BankingDealers.co.za This test is not yet approved or cleared by the [place] FDA and has been authorized for detection and/or diagnosis of SARS-CoV-2 by FDA under an Emergency Use Authorization (EUA).  This EUA will remain in effect (meaning this test can be used) for the duration of the COVID-19 declaration under Section 564(b)(1) of the Act, 21 U.S.C. section 360bbb-3(b)(1), unless the authorization is terminated or revoked sooner. Performed at [facility], [address] [place], [place] [address]   MRSA PCR Screening     Status: None   Collection Time: 12/26/18  1:54 AM   Specimen: Nasal Mucosa; Nasopharyngeal  Result Value Ref Range Status   MRSA by PCR NEGATIVE NEGATIVE Final    Comment:        The GeneXpert MRSA Assay (FDA approved for NASAL specimens only), is one component of a comprehensive MRSA colonization surveillance program. It is not intended to diagnose MRSA infection nor to guide or monitor treatment for MRSA infections. Performed at [facility], [address]  [place], [place] [address]   CULTURE, BLOOD (ROUTINE X 2) w Reflex to ID Panel     Status: None   Collection Time: 12/28/18  3:09 PM   Specimen: BLOOD  Result Value Ref Range Status   Specimen Description BLOOD RIGHT ANTECUBITAL  Final   Special Requests   Final    BOTTLES DRAWN AEROBIC AND ANAEROBIC Blood Culture adequate volume   Culture   Final    NO GROWTH 5 DAYS Performed at [facility], [address], [place], [place] [address]    Report Status 01/02/2019 FINAL  Final  CULTURE, BLOOD (ROUTINE X 2) w Reflex to ID Panel     Status: None   Collection Time: 12/28/18  3:19 PM   Specimen: BLOOD  Result Value Ref Range Status  Specimen Description BLOOD BLOOD RIGHT HAND  Final   Special Requests   Final    BOTTLES DRAWN AEROBIC AND ANAEROBIC Blood Culture adequate volume   Culture   Final    NO GROWTH 5 DAYS Performed at [facility], [address] [place], [place] [address]    Report Status 01/02/2019 FINAL  Final    Coagulation Studies: No results for input(s): LABPROT, INR in the last 72 hours.  Urinalysis: No results for input(s): COLORURINE, LABSPEC, PHURINE, GLUCOSEU, HGBUR, BILIRUBINUR, KETONESUR, PROTEINUR, UROBILINOGEN, NITRITE, LEUKOCYTESUR in the last 72 hours.  Invalid input(s): APPERANCEUR    Imaging: Dg Chest Port 1 View  Result Date: 01/02/2019 CLINICAL DATA:  Acute respiratory failure. EXAM: PORTABLE CHEST 1 VIEW COMPARISON:  Chest x-ray dated December 31, 2018. FINDINGS: Unchanged left chest wall pacemaker and cardiomegaly. Prior mitral valve replacement. Diffuse bilateral interstitial and airspace disease, mildly improved in the right upper lobe. Unchanged small bilateral pleural effusions. No pneumothorax. No acute osseous abnormality. IMPRESSION: 1. Persistent diffuse bilateral interstitial and airspace disease, mildly improved in the right upper lobe. Electronically Signed   By: Titus Dubin M.D.   On: 01/02/2019 08:40     Medications:    . acetaminophen 1,000 mg (01/02/19 1412)  . albumin human 12.5 g (01/02/19 1420)  . ascorbic acid 500 mg in sodium chloride 0.9% 50 ml 0 mg (12/31/18 2041)  . azithromycin Stopped (01/02/19 1114)  . cefTRIAXone (ROCEPHIN)  IV 2 g (01/01/19 2319)  . heparin 1,350 Units/hr (01/02/19 1300)  . norepinephrine (LEVOPHED) Adult infusion Stopped (12/31/18 1543)  . pureflow 1,500 mL/hr at 01/02/19 0526   . sodium chloride   Intravenous Once  . sodium chloride   Intravenous Once  . amiodarone  200 mg Oral Daily  . Chlorhexidine Gluconate Cloth  6 each Topical Daily  . cholecalciferol  2,000 Units Oral Daily  . digoxin  0.125 mg Intravenous Daily  . feeding supplement (ENSURE ENLIVE)  237 mL Oral BID BM  . ipratropium-albuterol  3 mL Nebulization TID  . Melatonin  5 mg Oral QHS  . midodrine  5 mg Oral TID WC  . multivitamin-lutein  1 capsule Oral Daily  . polyethylene glycol  17 g Oral Daily  . thiamine injection  100 mg Intravenous Daily   acetaminophen **OR** acetaminophen, albuterol, heparin, ondansetron **OR** ondansetron (ZOFRAN) IV, traMADol, traZODone  Assessment/ Plan:  84 y.o.Caucasian  female with congestive heart failure EF 25%, chronic kidney disease, hypertension, atrial fibrillation, lymphedema, mitral valve replacement (mechanical valve) requiring chronic anticoagulation, COPD, osteoporosis, history of multiple lumbar compression fractures requiring kyphoplasty, history of left leg hematoma with evacuation and porcine allograft, was admitted on 12/25/2018 with right hip fracture.   1.  Acute kidney injury, chronic kidney disease stage III.  Baseline 1.37/GFR 36 from December 15, 2018 2.  Chronic systolic congestive heart failure with EF 25% 3.  Lower extremity edema with third spacing 4.  Pulmonary hypertension 5.  Mechanical heart valve requiring chronic anticoagulation  -Patient still does not have any urine output over the preceding 24 hours.  Therefore we will need to  maintain the patient on CRRT.  Ultrafiltration achieved was 1.5 L over the preceding 24 hours.  We will maintain the patient on CRRT.  Continue dialysate flow rate of 1 L/h.   LOS: 8 Yakub Lodes 7/4/20202:21 PM  [place], [place]  Note: This note was prepared with Dragon dictation. Any transcription errors are unintentional

## 2019-01-02 NOTE — Progress Notes (Signed)
Patient had had bed at [facility] but family decided to decline transfer. Dr. Lanney Gins spoke with family and after Dr. Lanney Gins spoke with Dr. Marry Guan, Dr. Marcello Moores from anesthesia also spoke with family. Family understands that [facility] cannot hold the patient's bed and that if they decide to transfer her, the transfer process will have to start over. Family will discuss transfer again tomorrow. Renee, oncoming shift RN, to restart CRRT.

## 2019-01-02 NOTE — Progress Notes (Signed)
Pharmacy Antibiotic Note  [name] is a 84 y.o. female admitted on 12/25/2018 with aspiration pneumonia.  Pharmacy has been consulted for Meropenem dosing.  Plan: Will start Meropenem 1 gm IV Q12H.   Height: 4\' 11"  (149.9 cm) Weight: 137 lb 12.6 oz (62.5 kg) IBW/kg (Calculated) : 43.2  Temp (24hrs), Avg:97.7 F (36.5 C), Min:97.1 F (36.2 C), Max:98.2 F (36.8 C)  Recent Labs  Lab 12/28/18 0008  12/30/18 0823  12/31/18 0446  01/01/19 0452  01/01/19 2045 01/02/19 0152 01/02/19 0506 01/02/19 0926 01/02/19 1227  WBC 11.1*  --  9.6  --  13.9*  --  11.5*  --   --   --  18.5*  --   --   CREATININE 2.51*   < > 1.17*   < > 0.62   < > 0.80   < > 1.04* 1.15* 1.24* 1.24* 1.32*   < > = values in this interval not displayed.    Estimated Creatinine Clearance: 26.9 mL/min (A) (by C-G formula based on SCr of 1.32 mg/dL (H)).    Allergies  Allergen Reactions  . Ace Inhibitors     Cough  . Doxycycline Nausea Only  . Hydrocodone Itching and Nausea Only  . Mercury     Other reaction(s): Unknown  . Silver Dermatitis    Severe itching  . Cephalexin Rash  . Clindamycin/Lincomycin Rash  . Nickel Rash  . Penicillins Rash    Has patient had a PCN reaction causing immediate rash, facial/tongue/throat swelling, SOB or lightheadedness with hypotension: YES Has patient had a PCN reaction causing severe rash involving mucus membranes or skin necrosis: NO Has patient had a PCN reaction that required hospitalization NO Has patient had a PCN reaction occurring within the last 10 years: NO If all of the above answers are "NO", then may proceed with Cephalosporin use.    Antimicrobials this admission:   >>    >>   Dose adjustments this admission:   Microbiology results:  BCx:  UCx:    Sputum:    MRSA PCR:   Thank you for allowing pharmacy to be a part of this patient's care.  Martinez Boxx D 01/02/2019 8:59 PM

## 2019-01-02 NOTE — Progress Notes (Signed)
Marland Kitchen                                                                                                                                                                              CRITICAL CARE NOTE        SUBJECTIVE FINDINGS & SIGNIFICANT EVENTS    -Patient has been medically optimized for safe transfer to tertiary facility  - [facility] transfer initiated and patient accepted now waiting for bed placement (stated could be several days). -Discussed with Dr Luberta Mutter ortho and Dr Loleta Rose ICU  [facility] transfer- patient accepted and bed assigned, upon transport to [facility] [place] family had decided to cancel transfer.  - Discussed with Dr Vivia Budge cardiology ICU  - Anesthesia Dr Marcello Moores came to speak to patient family to again explain no additional options are available here and encouraged to continue with [facility] transfer but family still declined.    - will resume current care and try transfer again once family in agreement.   PAST MEDICAL HISTORY   Past Medical History:  Diagnosis Date  . Arthritis    "back, hands, knees" (04/10/2016)  . Chronic back pain   . 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 EF 25% by echo in 08/2015 b. RHC in 08/2015 showed normal filling pressures; c. 10/2018 Echo (in setting of atrial tachycardia): EF 25-30%, impaired relaxation. Glob HK. RVSP 52mmHg. Mod dil RA/LA. Mech MV w/ nl fxn (mean grad 19mmHg). Mod TR.   Marland Kitchen Chronic kidney disease   . Compression fracture    "several; all in my back" (04/10/2016)  . GERD (gastroesophageal reflux disease)   . Hypertension   . Lymphedema   . Pacemaker 05/2017  . PAF (paroxysmal atrial fibrillation) ([place])    a. On amio & Coumadin - CHA2DS2VASc = 4.  . S/P MVR (mitral valve replacement)    a. MVR 1994 b. redo MVR in 08/2014 - on Coumadin  . Shortness of breath dyspnea      SURGICAL HISTORY   Past Surgical History:  Procedure Laterality Date  . BREAST LUMPECTOMY Right 2005?   benign  lump excision  . CARDIAC CATHETERIZATION    . [place], 2016   "MVR; MVR"  . CARDIOVERSION N/A 11/06/2018   Procedure: CARDIOVERSION;  Surgeon: Minna Merritts, MD;  Location: [facility] ORS;  Service: Cardiovascular;  Laterality: N/A;  . CATARACT EXTRACTION W/PHACO Right 02/20/2015   Procedure: CATARACT EXTRACTION PHACO AND INTRAOCULAR LENS PLACEMENT ([place]);  Surgeon: Estill Cotta, MD;  Location: [facility] ORS;  Service: Ophthalmology;  Laterality: Right;  US01:31.2 AP 25.3% CDE40.13 Fluid lot # [id number]  . DRESSING CHANGE UNDER ANESTHESIA Left 11/17/2018  Procedure: DRESSING CHANGE;  Surgeon: Robert Bellow, MD;  Location: [facility] ORS;  Service: General;  Laterality: Left;  no anesthesia  . ELECTROPHYSIOLOGIC STUDY N/A 10/09/2015   Procedure: CARDIOVERSION;  Surgeon: Wellington Hampshire, MD;  Location: [facility] ORS;  Service: Cardiovascular;  Laterality: N/A;  . INCISION AND DRAINAGE OF WOUND Left 11/13/2018   Procedure: LEFT LEG DRESSING CHANGE;  Surgeon: Robert Bellow, MD;  Location: [facility] ORS;  Service: General;  Laterality: Left;  . INCISION AND DRAINAGE OF WOUND Left 11/19/2018   Procedure: LEFT LEG DRESSING CHANGE AND GRAFT APPLICATION;  Surgeon: Robert Bellow, MD;  Location: [facility] ORS;  Service: General;  Laterality: Left;  . IR GENERIC HISTORICAL  04/01/2016   IR RADIOLOGIST EVAL & MGMT 04/01/2016 [facility]  . IR GENERIC HISTORICAL  04/15/2016   IR VERTEBROPLASTY CERV/THOR BX INC UNI/BIL INC/INJECT/IMAGING 04/15/2016 Luanne Bras, MD [facility]  . IR GENERIC HISTORICAL  04/15/2016   IR VERTEBROPLASTY EA ADDL (T&LS) BX INC UNI/BIL INC INJECT/IMAGING 04/15/2016 Luanne Bras, MD [facility]  . IR GENERIC HISTORICAL  04/15/2016   IR VERTEBROPLASTY EA ADDL (T&LS) BX INC UNI/BIL INC INJECT/IMAGING 04/15/2016 Luanne Bras, MD [facility]  . IR GENERIC HISTORICAL  05/20/2016   IR RADIOLOGIST EVAL & MGMT 05/20/2016 [facility]  . IR GENERIC  HISTORICAL  06/13/2016   IR VERTEBROPLASTY EA ADDL (T&LS) BX INC UNI/BIL INC INJECT/IMAGING 06/13/2016 Luanne Bras, MD [facility]  . IR GENERIC HISTORICAL  06/13/2016   IR SACROPLASTY BILATERAL 06/13/2016 Luanne Bras, MD [facility]  . IR GENERIC HISTORICAL  06/13/2016   IR VERTEBROPLASTY LUMBAR BX INC UNI/BIL INC/INJECT/IMAGING 06/13/2016 Luanne Bras, MD [facility]  . IRRIGATION AND DEBRIDEMENT HEMATOMA Left 07/05/2015   Procedure: IRRIGATION AND DEBRIDEMENT HEMATOMA;  Surgeon: Robert Bellow, MD;  Location: [facility] OR[facility];  Service: General;  Laterality: Left;  . IRRIGATION AND DEBRIDEMENT HEMATOMA Left 11/11/2018   Procedure: IRRIGATION AND DEBRIDEMENT LEFT LEG HEMATOMA;  Surgeon: Robert Bellow, MD;  Location: [facility];  Service: General;  Laterality: Left;  . pyloric stenosis  07/1937  . US ECHOCARDIOGRAPHY       FAMILY HISTORY   Family History  Problem Relation Age of Onset  . Stroke Mother   . Renal Disease Father      SOCIAL HISTORY   Social History   Tobacco Use  . Smoking status: Former Smoker    Packs/day: 1.00    Years: 27.00    Pack years: 27.00    Types: Cigarettes    Quit date: 07/01/1980    Years since quitting: 38.5  . Smokeless tobacco: Never Used  . Tobacco comment: "quit smoking ~ 1980  Substance Use Topics  . Alcohol use: Not Currently    Alcohol/week: 0.0 standard drinks    Comment: 04/10/2016 "I'll have a drink on holidays/special occasions"  . Drug use: No     MEDICATIONS   Current Medication:  Current Facility-Administered Medications:  .  0.9 %  sodium chloride infusion (Manually program via Guardrails IV Fluids), , Intravenous, Once, Ojie, Jude, MD .  0.9 %  sodium chloride infusion (Manually program via Guardrails IV Fluids), , Intravenous, Once, Maximillian Habibi, MD .  acetaminophen (OFIRMEV) IV 1,000 mg, 1,000 mg, Intravenous, Q8H, Tukov-Yual, Magdalene S, NP, Stopped at 01/02/19 0526 .  acetaminophen (TYLENOL)  tablet 650 mg, 650 mg, Oral, Q6H PRN, 650 mg at 12/29/18 2216 **OR** acetaminophen (TYLENOL) suppository 650 mg, 650 mg, Rectal, Q6H PRN, Lance Coon, MD .  albumin  human 25 % solution 12.5 g, 12.5 g, Intravenous, Daily, Ottie Glazier, MD, Stopped at 01/01/19 1451 .  albuterol (PROVENTIL) (2.5 MG/3ML) 0.083% nebulizer solution 2.5 mg, 2.5 mg, Nebulization, Q2H PRN, Lanney Gins, Jatia Musa, MD .  amiodarone (PACERONE) tablet 200 mg, 200 mg, Oral, Daily, Ojie, Jude, MD, 200 mg at 01/01/19 1012 .  ascorbic acid 500 mg in sodium chloride 0.9% 50 ml, 500 mg, Intravenous, Daily, Lanney Gins, Erastus Bartolomei, MD, Last Rate: 0 mL/hr at 12/31/18 2041, 500 mg at 01/01/19 0941 .  azithromycin (ZITHROMAX) 500 mg in sodium chloride 0.9 % 250 mL IVPB, 500 mg, Intravenous, Q24H, Ottie Glazier, MD, Stopped at 01/01/19 1142 .  cefTRIAXone (ROCEPHIN) 2 g in sodium chloride 0.9 % 100 mL IVPB, 2 g, Intravenous, Q24H, Tukov-Yual, Magdalene S, NP, Last Rate: 200 mL/hr at 01/01/19 2319, 2 g at 01/01/19 2319 .  Chlorhexidine Gluconate Cloth 2 % PADS 6 each, 6 each, Topical, Daily, Ottie Glazier, MD, 6 each at 12/31/18 0919 .  cholecalciferol (VITAMIN D3) tablet 2,000 Units, 2,000 Units, Oral, Daily, Ojie, Jude, MD, 2,000 Units at 01/01/19 1014 .  digoxin (LANOXIN) 0.25 MG/ML injection 0.125 mg, 0.125 mg, Intravenous, Daily, Lanney Gins, Letina Luckett, MD, 0.125 mg at 01/01/19 0933 .  feeding supplement (ENSURE ENLIVE) (ENSURE ENLIVE) liquid 237 mL, 237 mL, Oral, BID BM, Ojie, Jude, MD, 237 mL at 01/01/19 1412 .  heparin ADULT infusion 100 units/mL (25000 units/286mL sodium chloride 0.45%), 1,350 Units/hr, Intravenous, Continuous, Ojie, Jude, MD, Last Rate: 13.5 mL/hr at 01/02/19 0700, 1,350 Units/hr at 01/02/19 0700 .  heparin injection 1,000-6,000 Units, 1,000-6,000 Units, CRRT, PRN, Holley Raring, Munsoor, MD, 1,400 Units at 01/01/19 1002 .  ipratropium-albuterol (DUONEB) 0.5-2.5 (3) MG/3ML nebulizer solution 3 mL, 3 mL, Nebulization, TID, Lanney Gins,  Shaquil Aldana, MD, 3 mL at 01/01/19 1439 .  Melatonin TABS 5 mg, 5 mg, Oral, QHS, Tukov-Yual, Magdalene S, NP .  midodrine (PROAMATINE) tablet 5 mg, 5 mg, Oral, TID WC, Eldredge Veldhuizen, MD, 5 mg at 01/01/19 1656 .  multivitamin-lutein (OCUVITE-LUTEIN) capsule 1 capsule, 1 capsule, Oral, Daily, Ojie, Jude, MD, 1 capsule at 01/01/19 1015 .  norepinephrine (LEVOPHED) 4mg  in 248mL premix infusion, 0-40 mcg/min, Intravenous, Titrated, Bradly Bienenstock, NP, Stopped at 12/31/18 1543 .  ondansetron (ZOFRAN) tablet 4 mg, 4 mg, Oral, Q6H PRN **OR** ondansetron (ZOFRAN) injection 4 mg, 4 mg, Intravenous, Q6H PRN, Lance Coon, MD .  polyethylene glycol (MIRALAX / GLYCOLAX) packet 17 g, 17 g, Oral, Daily, Ojie, Jude, MD, 17 g at 01/01/19 1012 .  pureflow IV solution for Dialysis, , CRRT, Continuous, Lateef, Munsoor, MD, Last Rate: 1,500 mL/hr at 01/02/19 0526 .  thiamine (B-1) injection 100 mg, 100 mg, Intravenous, Daily, Lanney Gins, Aulani Shipton, MD, 100 mg at 01/01/19 0933 .  traMADol (ULTRAM) tablet 50 mg, 50 mg, Oral, Q12H PRN, Stark Jock, Jude, MD, 50 mg at 12/31/18 0906 .  traZODone (DESYREL) tablet 75 mg, 75 mg, Oral, QHS PRN, Bradly Bienenstock, NP    ALLERGIES   Ace inhibitors, Doxycycline, Hydrocodone, Mercury, Silver, Cephalexin, Clindamycin/lincomycin, Nickel, and Penicillins    REVIEW OF SYSTEMS     10 point ROS done and is negative except for R hip pain and mild aggitation.   PHYSICAL EXAMINATION   Vitals:   01/02/19 0600 01/02/19 0700  BP: 105/72 (!) 116/59  Pulse: 65 95  Resp: (!) 30 (!) 24  Temp:    SpO2: 98% 99%    GENERAL:Mild distress due to restlessness HEAD: Normocephalic, atraumatic.  EYES: Pupils equal, round, reactive to light.  No scleral icterus.  MOUTH: Moist mucosal membrane. NECK: Supple. No thyromegaly. No nodules. No JVD.  PULMONARY:crackles at bases bilaterally CARDIOVASCULAR: S1 and S2. Regular rate and rhythm. No murmurs, rubs, or gallops.  GASTROINTESTINAL: Soft,  nontender, non-distended. No masses. Positive bowel sounds. No hepatosplenomegaly.  MUSCULOSKELETAL: Lower extremity edema with previous left extremity hematoma and now right hip fracture with serosanguineous oozing from [facility] on the right NEUROLOGIC: Mild distress due to acute illness SKIN:intact,warm,dry   LABS AND IMAGING       CXR 01/02/19       12/31/18 -CXR     LAB RESULTS: Recent Labs  Lab 01/01/19 2045 01/02/19 0152 01/02/19 0506  NA 136 136 137  K 4.3 4.4 4.7  CL 103 102 104  CO2 24 26 25   BUN 22 23 24*  CREATININE 1.04* 1.15* 1.24*  GLUCOSE 100* 98 109*   Recent Labs  Lab 12/31/18 0446 01/01/19 0452 01/02/19 0506  HGB 7.8* 7.8* 8.0*  HCT 25.5* 26.3* 27.2*  WBC 13.9* 11.5* 18.5*  PLT 142* 131* 151     IMAGING RESULTS: No results found.    ASSESSMENT AND PLAN    -Multidisciplinary rounds held today  Acute on chronic hypoxemic respiratory failure -Due tochronicsystolic CHF with EF 25 to 30% -Cardiology on case appreciate input-follow recommendations -Currently undergoing cvvh with net negative fluid balance - phosphorus was decreasing with previous CRRT rate, this was decreased and phos has improved but creatinine is worsening now. Will discuss with nephrologist -Complicated byacute blood loss anemia, pulmonary edema, CKD, atelectasis, deconditioning, R hip fracture - MetaNEB BID today -serial CXR reviewed with improved Right pulmonary edema but slight worsening of left side.  -[facility] transport agreed to move patient tonight to cardiac step down unit but patient family canceled transfer.     Chronic HFrEF with EF 37-85%  -Complicated by AF and mechMV requiring [facility] - cardiology on case - appreciate input - on Heparin gtt tolerating acceptably   Acute blood loss anemia   - due to R hip fracture and hematoma formation    - s/p 3 units prbc transfusion    - h/h borderline low but stable , h/h monitoring , transfuse as needed       Rt hip  fracture      -discussed case and care plan with  Dr Marry Guan and anesthesiology Dr Janett Billow - appreciate input    - high risk patient holding plan for surgery until further eval    Pulmonary pre-operative evaluation  ARISCAT ([place]) preoperative pulmonary risk index in adults- High risk - 42.1% chance of any pulmonary complication postoperatively           -Mainly due to advanced age, baseline chronic hypoxemia, preoperative anemia, significant comorbid conditions  [place] respiratory failure index - moderate-high risk risk- 10.1% chance of post operative respiratory failure requiring MV >48h  GUPTA calculator for postoperative respiratory failure - moderate risk - 4.03% chance of postoperative respiratory failure requiring mechanical ventilation over 48 hours   Acute on chronic renal insufficiency -Has been tolerating CVVH well -Likely cardiorenal in origin -creatinine trending down with decreased rate on cvvh    Bilateral atelectasis -Contributing to hypoxemia - chest PT - MetaNeb BID - Incentive spirometer to be used each hour multiple times - please encourage to use  - patient has difficulty expectorating phlegm - will ask RT to bring Acapella device as well    Moderate protein calorie malnutrition and deconditioning -Contributing to dyspnea -Patient working with physical therapy  was able to get out of bed and slowly walk today but encouraged to continue this as well as outpatient -Nutritional assessment patient has been aspirating and has been evaluated with modified barium swallow recommended aspiration precautions-s/p dietician evaluation - appreciate input     GI/Nutrition GI PROPHYLAXIS as indicated DIET-->TF's as tolerated Constipation protocol as indicated   ENDO - ICU hypoglycemic\Hyperglycemia protocol -check FSBS per protocol   ELECTROLYTES -follow labs as needed -replace as needed -pharmacy consultation   DVT/GI PRX ordered -SCDs   TRANSFUSIONS AS NEEDED MONITOR FSBS ASSESS the need for LABS as needed   Critical care provider statement:    Critical care time (minutes):  172   Critical care time was exclusive of:  Separately billable procedures and treating other patients   Critical care was necessary to treat or prevent imminent or life-threatening deterioration of the following conditions:  acute hypoxemic respiratory failure, AKI, acute blood loss anemia, Rt hip fracture, multiple comorbid conditoins   Critical care was time spent personally by me on the following activities:  Development of treatment plan with patient or surrogate, discussions with consultants, evaluation of patient's response to treatment, examination of patient, obtaining history from patient or surrogate, ordering and performing treatments and interventions, ordering and review of laboratory studies and re-evaluation of patient's condition.  I assumed direction of critical care for this patient from another provider in my specialty: no    This document was prepared using Dragon voice recognition software and may include unintentional dictation errors.    Ottie Glazier, M.D.  Division of [place]

## 2019-01-02 NOTE — Progress Notes (Signed)
[name] for Heparin Drip Indication: mechanical Mitral Valve/A.fib  Patient Measurements: Height: 4\' 11"  (149.9 cm) Weight: 137 lb 12.6 oz (62.5 kg) IBW/kg (Calculated) : 43.2 Heparin Dosing Weight: 57.9kg  Vital Signs: Temp: 97.6 F (36.4 C) (07/04 0500) Temp Source: Oral (07/04 0500) BP: 106/88 (07/04 0800) Pulse Rate: 39 (07/04 0800)  Labs: Recent Labs    12/31/18 0446  01/01/19 0110 01/01/19 0452 01/01/19 0945  01/01/19 2045 01/02/19 0152 01/02/19 0506  HGB 7.8*  --   --  7.8*  --   --   --   --  8.0*  HCT 25.5*  --   --  26.3*  --   --   --   --  27.2*  PLT 142*  --   --  131*  --   --   --   --  151  HEPARINUNFRC  --    < > 0.30  --  0.32  --   --   --  0.19*  CREATININE 0.62   < > 0.74 0.80 0.87   < > 1.04* 1.15* 1.24*   < > = values in this interval not displayed.    Estimated Creatinine Clearance: 28.6 mL/min (A) (by C-G formula based on SCr of 1.24 mg/dL (H)).   Medical History: Past Medical History:  Diagnosis Date  . Arthritis    "back, hands, knees" (04/10/2016)  . Chronic back pain   . 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 EF 25% by echo in 08/2015 b. RHC in 08/2015 showed normal filling pressures; c. 10/2018 Echo (in setting of atrial tachycardia): EF 25-30%, impaired relaxation. Glob HK. RVSP 34mmHg. Mod dil RA/LA. Mech MV w/ nl fxn (mean grad 19mmHg). Mod TR.   Marland Kitchen Chronic kidney disease   . Compression fracture    "several; all in my back" (04/10/2016)  . GERD (gastroesophageal reflux disease)   . Hypertension   . Lymphedema   . Pacemaker 05/2017  . PAF (paroxysmal atrial fibrillation) ([place])    a. On amio & Coumadin - CHA2DS2VASc = 4.  . S/P MVR (mitral valve replacement)    a. MVR 1994 b. redo MVR in 08/2014 - on Coumadin  . Shortness of breath dyspnea     Assessment: Closed right hip fracture secondary to mechanical fall  Coagulopathy was reversed  with vitamin K and INR down to 1.2 on 6/28.-Patient on Coumadin prior to admission Paroxysmal atrial fibrillation and history of mechanical mitral valve; AV paced Rate controlled. Given history of mechanical mitral valve, patient was previously started on Heparin on 6/28 but was stopped on 6/29 due to significant drop in hemoglobin(7.8>6.1). Patient has since received PRBC transfusion and Hgb is currently 7.2. Per Dr. Saunders Revel, it is safe to restart heparin drip and has placed the consult for Pharmacy to re-initiate  Goal of Therapy:  Heparin level 0.3-0.7 units/ml Monitor platelets  Plan:  07/04 1414 HL 0.39: therapeutic  continue rate at 1350 units/hr  recheck HL in 8 hours  CBC in am  Pharmacy will continue to monitor and adjust per consult.   Vallery Sa, PharmD Clinical Pharmacist 01/02/2019 8:48 AM

## 2019-01-02 NOTE — Progress Notes (Signed)
   01/02/19 1000  Clinical Encounter Type  Visited With Patient  Visit Type Follow-up  Ch followed up on the pt. Pt seemed slightly confused but most of the times responded accordingly to ch's inquiries. Pt is bothered my the mitten on her hand. Other than that, pt seems to be in a pleasant mood. Ch will follow up to check on her and to continue providing emotional support.

## 2019-01-02 NOTE — Progress Notes (Signed)
CRRT restarted at [facility]  01/02/19

## 2019-01-02 NOTE — Progress Notes (Signed)
ANTICOAGULATION CONSULT NOTE  Pharmacy Consult for Heparin Drip Indication: mechanical Mitral Valve/A.fib  Allergies  Allergen Reactions  . Ace Inhibitors     Cough  . Doxycycline Nausea Only  . Hydrocodone Itching and Nausea Only  . Mercury     Other reaction(s): Unknown  . Silver Dermatitis    Severe itching  . Cephalexin Rash  . Clindamycin/Lincomycin Rash  . Nickel Rash  . Penicillins Rash    Has patient had a PCN reaction causing immediate rash, facial/tongue/throat swelling, SOB or lightheadedness with hypotension: YES Has patient had a PCN reaction causing severe rash involving mucus membranes or skin necrosis: NO Has patient had a PCN reaction that required hospitalization NO Has patient had a PCN reaction occurring within the last 10 years: NO If all of the above answers are "NO", then may proceed with Cephalosporin use.    Patient Measurements: Height: 4\' 11"  (149.9 cm) Weight: 137 lb 12.6 oz (62.5 kg) IBW/kg (Calculated) : 43.2 Heparin Dosing Weight: 57.9kg  Vital Signs: Temp: 97.6 F (36.4 C) (07/04 0500) Temp Source: Oral (07/04 0500) BP: 107/76 (07/04 0500) Pulse Rate: 95 (07/04 0500)  Labs: Recent Labs    12/31/18 0446  01/01/19 0110 01/01/19 0452 01/01/19 0945  01/01/19 2045 01/02/19 0152 01/02/19 0506  HGB 7.8*  --   --  7.8*  --   --   --   --  8.0*  HCT 25.5*  --   --  26.3*  --   --   --   --  27.2*  PLT 142*  --   --  131*  --   --   --   --  151  HEPARINUNFRC  --    < > 0.30  --  0.32  --   --   --  0.19*  CREATININE 0.62   < > 0.74 0.80 0.87   < > 1.04* 1.15* 1.24*   < > = values in this interval not displayed.    Estimated Creatinine Clearance: 28.6 mL/min (A) (by C-G formula based on SCr of 1.24 mg/dL (H)).   Medical History: Past Medical History:  Diagnosis Date  . Arthritis    "back, hands, knees" (04/10/2016)  . Chronic back pain   . 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 EF 25% by echo in 08/2015 b. RHC in 08/2015 showed normal filling pressures; c. 10/2018 Echo (in setting of atrial tachycardia): EF 25-30%, impaired relaxation. Glob HK. RVSP 59mmHg. Mod dil RA/LA. Mech MV w/ nl fxn (mean grad 55mmHg). Mod TR.   Marland Kitchen Chronic kidney disease   . Compression fracture    "several; all in my back" (04/10/2016)  . GERD (gastroesophageal reflux disease)   . Hypertension   . Lymphedema   . Pacemaker 05/2017  . PAF (paroxysmal atrial fibrillation) ([place])    a. On amio & Coumadin - CHA2DS2VASc = 4.  . S/P MVR (mitral valve replacement)    a. MVR 1994 b. redo MVR in 08/2014 - on Coumadin  . Shortness of breath dyspnea     Medications:    Assessment: Closed right hip fracture secondary to mechanical fall  Coagulopathy was reversed with vitamin K and INR down to 1.2 on 6/28.-Patient on Coumadin prior to admission Paroxysmal atrial fibrillation and history of mechanical mitral valve; AV paced Rate controlled. Given history of mechanical mitral valve, patient was previously started on Heparin on 6/28 but was stopped on 6/29 due to significant  drop in hemoglobin(7.8>6.1). Patient has since received PRBC transfusion and Hgb is currently 7.2. Per Dr. Saunders Revel, it is safe to restart heparin drip and has placed the consult for Pharmacy to re-initiate   Goal of Therapy:  Heparin level 0.3-0.7 units/ml Monitor platelets  Plan: 07/03 @ 0100 HL 0.19 subtherapeutic. Will increase rate to 1350 units/hr and will recheck HL @1400 , CBC stable w/ Hgb currently @ 8.0, patient was last transfused on 07/01. Will continue to monitor.  Pharmacy will continue to monitor and adjust per consult.   Tobie Lords, PharmD, BCPS Clinical Pharmacist 01/02/2019 6:06 AM

## 2019-01-02 NOTE — Evaluation (Signed)
Clinical/Bedside Swallow Evaluation Patient Details  Name: [name] MRN: [id number] Date of Birth: June 26, 1935  Today's Date: 01/02/2019 Time: SLP Start Time (ACUTE ONLY): 0820 SLP Stop Time (ACUTE ONLY): 0905 SLP Time Calculation (min) (ACUTE ONLY): 45 min  Past Medical History:  Past Medical History:  Diagnosis Date  . Arthritis    "back, hands, knees" (04/10/2016)  . Chronic back pain   . 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 EF 25% by echo in 08/2015 b. RHC in 08/2015 showed normal filling pressures; c. 10/2018 Echo (in setting of atrial tachycardia): EF 25-30%, impaired relaxation. Glob HK. RVSP 70mmHg. Mod dil RA/LA. Mech MV w/ nl fxn (mean grad 11mmHg). Mod TR.   Marland Kitchen Chronic kidney disease   . Compression fracture    "several; all in my back" (04/10/2016)  . GERD (gastroesophageal reflux disease)   . Hypertension   . Lymphedema   . Pacemaker 05/2017  . PAF (paroxysmal atrial fibrillation) ([place])    a. On amio & Coumadin - CHA2DS2VASc = 4.  . S/P MVR (mitral valve replacement)    a. MVR 1994 b. redo MVR in 08/2014 - on Coumadin  . Shortness of breath dyspnea    Past Surgical History:  Past Surgical History:  Procedure Laterality Date  . BREAST LUMPECTOMY Right 2005?   benign lump excision  . CARDIAC CATHETERIZATION    . [place], 2016   "MVR; MVR"  . CARDIOVERSION N/A 11/06/2018   Procedure: CARDIOVERSION;  Surgeon: Minna Merritts, MD;  Location: [facility];  Service: Cardiovascular;  Laterality: N/A;  . CATARACT EXTRACTION W/PHACO Right 02/20/2015   Procedure: CATARACT EXTRACTION PHACO AND INTRAOCULAR LENS PLACEMENT ([place]);  Surgeon: Estill Cotta, MD;  Location: [facility];  Service: Ophthalmology;  Laterality: Right;  US01:31.2 AP 25.3% CDE40.13 Fluid lot # [id number]  . DRESSING CHANGE UNDER ANESTHESIA Left 11/17/2018   Procedure: DRESSING CHANGE;  Surgeon: Robert Bellow, MD;   Location: [facility];  Service: General;  Laterality: Left;  no anesthesia  . ELECTROPHYSIOLOGIC STUDY N/A 10/09/2015   Procedure: CARDIOVERSION;  Surgeon: Wellington Hampshire, MD;  Location: [facility];  Service: Cardiovascular;  Laterality: N/A;  . INCISION AND DRAINAGE OF WOUND Left 11/13/2018   Procedure: LEFT LEG DRESSING CHANGE;  Surgeon: Robert Bellow, MD;  Location: [facility];  Service: General;  Laterality: Left;  . INCISION AND DRAINAGE OF WOUND Left 11/19/2018   Procedure: LEFT LEG DRESSING CHANGE AND GRAFT APPLICATION;  Surgeon: Robert Bellow, MD;  Location: [facility];  Service: General;  Laterality: Left;  . IR GENERIC HISTORICAL  04/01/2016   IR RADIOLOGIST EVAL & MGMT 04/01/2016 [facility]  . IR GENERIC HISTORICAL  04/15/2016   IR VERTEBROPLASTY CERV/THOR BX INC UNI/BIL INC/INJECT/IMAGING 04/15/2016 Luanne Bras, MD [facility]  . IR GENERIC HISTORICAL  04/15/2016   IR VERTEBROPLASTY EA ADDL (T&LS) BX INC UNI/BIL INC INJECT/IMAGING 04/15/2016 Luanne Bras, MD [facility]  . IR GENERIC HISTORICAL  04/15/2016   IR VERTEBROPLASTY EA ADDL (T&LS) BX INC UNI/BIL INC INJECT/IMAGING 04/15/2016 Luanne Bras, MD [facility]  . IR GENERIC HISTORICAL  05/20/2016   IR RADIOLOGIST EVAL & MGMT 05/20/2016 [facility]  . IR GENERIC HISTORICAL  06/13/2016   IR VERTEBROPLASTY EA ADDL (T&LS) BX INC UNI/BIL INC INJECT/IMAGING 06/13/2016 Luanne Bras, MD [facility]  . IR GENERIC HISTORICAL  06/13/2016   IR SACROPLASTY BILATERAL 06/13/2016 Luanne Bras, MD [facility]  . IR  GENERIC HISTORICAL  06/13/2016   IR VERTEBROPLASTY LUMBAR BX INC UNI/BIL INC/INJECT/IMAGING 06/13/2016 Luanne Bras, MD [facility]  . IRRIGATION AND DEBRIDEMENT HEMATOMA Left 07/05/2015   Procedure: IRRIGATION AND DEBRIDEMENT HEMATOMA;  Surgeon: Robert Bellow, MD;  Location: [facility];  Service: General;  Laterality: Left;  . IRRIGATION AND DEBRIDEMENT HEMATOMA Left 11/11/2018    Procedure: IRRIGATION AND DEBRIDEMENT LEFT LEG HEMATOMA;  Surgeon: Robert Bellow, MD;  Location: [facility];  Service: General;  Laterality: Left;  . pyloric stenosis  07/1935  . US ECHOCARDIOGRAPHY     HPI:  Pt was readmitted to the hospital s/p another fall, or slide from the chair. She is a 84 y.o. female with a known history of GERD w/ Esophageal phase dysmotility (pt even reported a surgery for pyloric stenosis "as a child"), atrial fibrillation, combined CHF, nonischemic cardiomyopathy, hypertension, chronic lower extremity edema, mitral valve disease status post mechanical valve replacement and currently on anticoagulation therapy with Coumadin.  Patient also has a history of CKD.  She was referred for direct admission from her cardiologist, Dr. Rockey Situ, office for acute exacerbation of combined CHF.  Ejection fraction on echocardiogram completed 11/05/2018 was 25 to 30% which was decreased from 40% in December 2019.  Patient has been admitted twice over the last month.  Initial admission was related to exacerbation of CHF with chronic renal failure as well as an episode of atrial tachycardia.  She underwent cardioversion on May 8.  After patient was discharged home, she returned with large spontaneous hematoma to her left calf which ultimately required debridement and biological graft placement which occurred on May 13.  Patient was then discharged to rehabilitation center where she has been up to this point.  She now returns to be admitted from the cardiologist office for exacerbation of her CHF with increased edema of her lower extremities as well as increased shortness of breath with minimal exertion and at rest when lying flat.  According to documentation, Lasix has already been decreased to 40 mg twice daily which is decreased from 80 mg twice daily. CXR today revealed: "Persistent diffuse bilateral interstitial and airspace disease, mildly improved in the right upper lobe".  Per NSG report, pt has  not been tolerating po meals well; family and pt report and describe GI/Esophageal dysmotility w/ NSG noting coughing during/post meals after several bites/sips. She is currently on a regular diet since ordered at admit. Pt is suppose to swallow Pill in Puree for easier swallowing s/p last BSE but has declined this w/ NSG, per report.  Pt has acute on chronic hypoxemic respiratory failure w/ Cardiac issues including EF 25-30%, pacemaker, R hip fx w/out repair currently, 4 liters O2 chronic, and Cognitive decline noted. She looks much declined/deconditioned since last BSE/assessment.    Assessment / Plan / Recommendation Clinical Impression  Pt seen today for BSE; pt recently seen for BSE last month during admission by this therapist. [name] overall presentation (both medical and Cognitive for engagement/alertness) are [facility] declined from her presentation at previous BSE last month. Per NSG report, pt's State waxes and wanes w/ declined Cognitive engagement. Also per NSG, family has endorsed s/s of Esophageal dysmotility and/or Reflux at home. This was discussed at the last BSE as pt has had a h/o Esophageal dysmotility noted in 2018 during a MBSS. Strict REFLUX Precautions recommended d/t Esophageal phase dysmotility(any Regurgitation can increase risk for aspiration of Reflux material thus Pulmonary decline). Recommended Pills in PUREE for easier, safer swallowing - explore liquid,  chewable, or powder forms as well if desired. Recommended f/u w/ GI for formal assessment and education; and recommended follow REFLUX precautions including lessening meats/breads in diet; moistened SMALL bites of foods well; frequent mini meals.  During this evaluation today w/ NSG present, pt was too drowsy for continued po trials/presentation. Pt only gave minimal oral response to TSP trials of Nectar liquids placed at lips and to small amount placed anteriorly in mouth - despite Mod-Max verbal/tactile cues by SLP, NSG. Oral phase  c/b decreased labial closure on spoon, decreased oral awareness to control/manipulate material, oral holding and prolonged oral phase time. Min amount of bolus residue was removed. During the pharyngeal phase, a pharyngeal swallow was much delayed in occurring during palpation w/ trials(unsure if pt transferred much material). Laryngeal excursion during the swallow appeared reduced. No further trials given d/t increased risk for aspiration, choking. OM exam could not be completed d/t pt's level of participation/awareness.  Due to pt's overall decline in status and increased risk for aspiration, recommend modifying diet to include thickened liquids; strict aspiration precautions and giving po's only w/ 100% NSG Supervision and feeding support, and only when pt is fully alert/awake to safely participate. Pills given crushed in Puree as needed. Recommend f/u by Team to discuss [place]; Dietician support.  SLP Visit Diagnosis: Dysphagia, pharyngoesophageal phase (R13.14);Dysphagia, oropharyngeal phase (R13.12)    Aspiration Risk  Mild aspiration risk;Moderate aspiration risk;Risk for inadequate nutrition/hydration    Diet Recommendation  Dysphagia level 1 (puree) w/ Nectar consistency liquids VIA TSP; strict aspiration precautions w/ 100% Supervision w/ any oral intake; feeding support. Reflux precautions.   Medication Administration: Crushed with puree(as able)    Other  Recommendations Recommended Consults: Consider GI evaluation;Consider esophageal assessment(baseline dysmotility; Palliative Care consult; Dietician f/u) Oral Care Recommendations: Oral care BID;Staff/trained caregiver to provide oral care Other Recommendations: Order thickener from pharmacy;Prohibited food (jello, ice cream, thin soups);Remove water pitcher;Have oral suction available   Follow up Recommendations Skilled Nursing facility(TBD)      Frequency and Duration min 2x/week  2 weeks       Prognosis Prognosis for Safe Diet  Advancement: Guarded Barriers to Reach Goals: Cognitive deficits;Severity of deficits;Behavior(GI issues)      Swallow Study   General Date of Onset: 12/25/18 HPI: Pt was readmitted to the hospital s/p another fall, or slide from the chair. She is a 84 y.o. female with a known history of GERD w/ Esophageal phase dysmotility (pt even reported a surgery for pyloric stenosis "as a child"), atrial fibrillation, combined CHF, nonischemic cardiomyopathy, hypertension, chronic lower extremity edema, mitral valve disease status post mechanical valve replacement and currently on anticoagulation therapy with Coumadin.  Patient also has a history of CKD.  She was referred for direct admission from her cardiologist, Dr. Rockey Situ, office for acute exacerbation of combined CHF.  Ejection fraction on echocardiogram completed 11/05/2018 was 25 to 30% which was decreased from 40% in December 2019.  Patient has been admitted twice over the last month.  Initial admission was related to exacerbation of CHF with chronic renal failure as well as an episode of atrial tachycardia.  She underwent cardioversion on May 8.  After patient was discharged home, she returned with large spontaneous hematoma to her left calf which ultimately required debridement and biological graft placement which occurred on May 13.  Patient was then discharged to rehabilitation center where she has been up to this point.  She now returns to be admitted from the cardiologist office for exacerbation of  her CHF with increased edema of her lower extremities as well as increased shortness of breath with minimal exertion and at rest when lying flat.  According to documentation, Lasix has already been decreased to 40 mg twice daily which is decreased from 80 mg twice daily. CXR today revealed: "Persistent diffuse bilateral interstitial and airspace disease, mildly improved in the right upper lobe".  Per NSG report, pt has not been tolerating po meals well; family and  pt report and describe GI/Esophageal dysmotility w/ NSG noting coughing during/post meals after several bites/sips. She is currently on a regular diet since ordered at admit. Pt is suppose to swallow Pill in Puree for easier swallowing s/p last BSE but has declined this w/ NSG, per report.  Pt has acute on chronic hypoxemic respiratory failure w/ Cardiac issues including EF 25-30%, pacemaker, R hip fx w/out repair currently, 4 liters O2 chronic, and Cognitive decline noted. She looks much declined/deconditioned since last BSE/assessment.  Type of Study: Bedside Swallow Evaluation Previous Swallow Assessment: MBSS in 2018 in which no gross oropharyngeal phase dysphagia was noted, HOWEVER, Esophageal phase dysmotility was noted and pt was recommended to f/u w/ GI (no GI f/u noted in chart notes or per pt report). Diet Prior to this Study: Regular;Thin liquids Temperature Spikes Noted: No(wbc 18.5) Respiratory Status: Nasal cannula(4 liters) History of Recent Intubation: No Behavior/Cognition: Confused;Lethargic/Drowsy;Requires cueing;Doesn't follow directions(Eyes opened; few mumbled phonations) Oral Cavity Assessment: Dry Oral Care Completed by SLP: Recent completion by staff Oral Cavity - Dentition: Adequate natural dentition Vision: (n/a) Self-Feeding Abilities: Total assist Patient Positioning: Upright in bed(needed full positioning) Baseline Vocal Quality: Low vocal intensity Volitional Cough: Cognitively unable to elicit Volitional Swallow: Unable to elicit    Oral/Motor/Sensory Function Overall Oral Motor/Sensory Function: Generalized oral weakness(could not follow through to fully assess) Facial Symmetry: Within Functional Limits   Ice Chips Ice chips: Not tested   Thin Liquid Thin Liquid: Not tested    Nectar Thick Nectar Thick Liquid: Impaired Presentation: Spoon(fed; 2 trials) Oral Phase Impairments: Reduced labial seal;Reduced lingual movement/coordination;Poor awareness of  bolus Oral phase functional implications: Right anterior spillage;Oral holding;Oral residue((leaning to her R)) Pharyngeal Phase Impairments: Suspected delayed Swallow(pharyngeal swallow much delayed in occurring) Other Comments: pt was too drowsy and not fully attending to safely take po trials - NSG reported pt's State waxed and waned such as this in the past 2-3 days.    Honey Thick Honey Thick Liquid: Not tested   Puree Puree: Not tested   Solid     Solid: Not tested       Orinda Kenner, MS, CCC-SLP Watson,Katherine 01/02/2019,12:54 PM

## 2019-01-03 ENCOUNTER — Inpatient Hospital Stay: Payer: Medicare Other

## 2019-01-03 LAB — HEPARIN LEVEL (UNFRACTIONATED)
Heparin Unfractionated: 0.25 IU/mL — ABNORMAL LOW (ref 0.30–0.70)
Heparin Unfractionated: 0.33 IU/mL (ref 0.30–0.70)
Heparin Unfractionated: 0.47 IU/mL (ref 0.30–0.70)

## 2019-01-03 LAB — RENAL FUNCTION PANEL
Albumin: 3.2 g/dL — ABNORMAL LOW (ref 3.5–5.0)
Albumin: 3.1 g/dL — ABNORMAL LOW (ref 3.5–5.0)
Albumin: 3.4 g/dL — ABNORMAL LOW (ref 3.5–5.0)
Anion gap: 8 (ref 5–15)
Anion gap: 7 (ref 5–15)
Anion gap: 10 (ref 5–15)
BUN: 29 mg/dL — ABNORMAL HIGH (ref 8–23)
BUN: 29 mg/dL — ABNORMAL HIGH (ref 8–23)
BUN: 28 mg/dL — ABNORMAL HIGH (ref 8–23)
CO2: 25 mmol/L (ref 22–32)
CO2: 25 mmol/L (ref 22–32)
CO2: 23 mmol/L (ref 22–32)
Calcium: 8.7 mg/dL — ABNORMAL LOW (ref 8.9–10.3)
Calcium: 8.7 mg/dL — ABNORMAL LOW (ref 8.9–10.3)
Calcium: 9 mg/dL (ref 8.9–10.3)
Chloride: 106 mmol/L (ref 98–111)
Chloride: 107 mmol/L (ref 98–111)
Chloride: 105 mmol/L (ref 98–111)
Creatinine, Ser: 1.42 mg/dL — ABNORMAL HIGH (ref 0.44–1.00)
Creatinine, Ser: 1.29 mg/dL — ABNORMAL HIGH (ref 0.44–1.00)
Creatinine, Ser: 1.27 mg/dL — ABNORMAL HIGH (ref 0.44–1.00)
GFR calc Af Amer: 40 mL/min — ABNORMAL LOW (ref >60)
GFR calc Af Amer: 45 mL/min — ABNORMAL LOW (ref >60)
GFR calc Af Amer: 46 mL/min — ABNORMAL LOW (ref >60)
GFR calc non Af Amer: 35 mL/min — ABNORMAL LOW (ref >60)
GFR calc non Af Amer: 39 mL/min — ABNORMAL LOW (ref >60)
GFR calc non Af Amer: 40 mL/min — ABNORMAL LOW (ref >60)
Glucose, Bld: 83 mg/dL (ref 70–99)
Glucose, Bld: 82 mg/dL (ref 70–99)
Glucose, Bld: 87 mg/dL (ref 70–99)
Phosphorus: 2.4 mg/dL — ABNORMAL LOW (ref 2.5–4.6)
Phosphorus: 2.4 mg/dL — ABNORMAL LOW (ref 2.5–4.6)
Phosphorus: 2.3 mg/dL — ABNORMAL LOW (ref 2.5–4.6)
Potassium: 4.1 mmol/L (ref 3.5–5.1)
Potassium: 3.9 mmol/L (ref 3.5–5.1)
Potassium: 4 mmol/L (ref 3.5–5.1)
Sodium: 139 mmol/L (ref 135–145)
Sodium: 139 mmol/L (ref 135–145)
Sodium: 138 mmol/L (ref 135–145)

## 2019-01-03 LAB — CBC
HCT: 23.1 % — ABNORMAL LOW (ref 36.0–46.0)
Hemoglobin: 6.9 g/dL — ABNORMAL LOW (ref 12.0–15.0)
MCH: 26.8 pg (ref 26.0–34.0)
MCHC: 29.9 g/dL — ABNORMAL LOW (ref 30.0–36.0)
MCV: 89.9 fL (ref 80.0–100.0)
Platelets: 123 10*3/uL — ABNORMAL LOW (ref 150–400)
RBC: 2.57 MIL/uL — ABNORMAL LOW (ref 3.87–5.11)
RDW: 19.6 % — ABNORMAL HIGH (ref 11.5–15.5)
WBC: 11.1 10*3/uL — ABNORMAL HIGH (ref 4.0–10.5)
nRBC: 0 % (ref 0.0–0.2)

## 2019-01-03 LAB — MAGNESIUM
Magnesium: 2 mg/dL (ref 1.7–2.4)
Magnesium: 1.9 mg/dL (ref 1.7–2.4)
Magnesium: 1.9 mg/dL (ref 1.7–2.4)

## 2019-01-03 MED ORDER — K PHOS MONO-SOD PHOS DI & MONO 155-852-130 MG PO TABS
500.0000 mg | ORAL_TABLET | ORAL | Status: AC
  Administered 2019-01-03: 500 mg via ORAL
  Filled 2019-01-03 (×2): qty 2

## 2019-01-03 MED ORDER — ACETAMINOPHEN 10 MG/ML IV SOLN
1000.0000 mg | Freq: Once | INTRAVENOUS | Status: AC
  Administered 2019-01-03: 1000 mg via INTRAVENOUS
  Filled 2019-01-03: qty 100

## 2019-01-03 MED ORDER — SODIUM CHLORIDE 0.9% IV SOLUTION: Freq: Once | INTRAVENOUS | Status: DC

## 2019-01-03 NOTE — Progress Notes (Signed)
[facility] [place] NAME: [name]    MR#:  [id number]  DATE OF BIRTH:  01/13/1935  SUBJECTIVE:  CHIEF COMPLAINT:   Chief Complaint  Patient presents with  . Fall  . Hip Pain   No new complaint reported this morning.  Patient appears more more awake and alert this morning.  Confusion improving.  Daughter present at bedside updated on treatment plans.Marland Kitchen  REVIEW OF SYSTEMS:  Unobtainable due to underlying medical conditions.  DRUG ALLERGIES:   Allergies  Allergen Reactions  . Ace Inhibitors     Cough  . Doxycycline Nausea Only  . Hydrocodone Itching and Nausea Only  . Mercury     Other reaction(s): Unknown  . Silver Dermatitis    Severe itching  . Cephalexin Rash  . Clindamycin/Lincomycin Rash  . Nickel Rash  . Penicillins Rash    Has patient had a PCN reaction causing immediate rash, facial/tongue/throat swelling, SOB or lightheadedness with hypotension: YES Has patient had a PCN reaction causing severe rash involving mucus membranes or skin necrosis: NO Has patient had a PCN reaction that required hospitalization NO Has patient had a PCN reaction occurring within the last 10 years: NO If all of the above answers are "NO", then may proceed with Cephalosporin use.   VITALS:  Blood pressure (!) 105/53, pulse 72, temperature 97.7 F (36.5 C), temperature source Axillary, resp. rate (!) 27, height 4\' 11"  (1.499 m), weight 65.7 kg, SpO2 98 %. PHYSICAL EXAMINATION:   Physical Exam  Constitutional: She is well-developed, well-nourished, and in no distress.  HENT:  Head: Normocephalic and atraumatic.  Right Ear: External ear normal.  Eyes: Pupils are equal, round, and reactive to light. Conjunctivae are normal. Right eye exhibits no discharge.  Neck: Normal range of motion. Neck supple. No thyromegaly present.  Cardiovascular: Normal rate.  Irregularly irregular  Pulmonary/Chest: Effort normal and breath sounds  normal. No respiratory distress.  Abdominal: Soft. Bowel sounds are normal. She exhibits no distension.  Musculoskeletal:        General: No edema.     Comments: Slight discomfort and ecchymosis around right hip fracture site  Neurological:  Pleasantly confused  Skin: Skin is warm and dry. She is not diaphoretic. No erythema.  Psychiatric: Affect and judgment normal.   LABORATORY PANEL:  Female CBC Recent Labs  Lab 01/03/19 0504  WBC 11.1*  HGB 6.9*  HCT 23.1*  PLT 123*   ------------------------------------------------------------------------------------------------------------------ Chemistries  Recent Labs  Lab 01/03/19 0919  NA 138  K 4.0  CL 105  CO2 23  GLUCOSE 87  BUN 28*  CREATININE 1.27*  CALCIUM 9.0  MG 1.9   RADIOLOGY:  Dg Chest Port 1 View  Result Date: 01/03/2019 CLINICAL DATA:  Acute respiratory failure. EXAM: PORTABLE CHEST 1 VIEW COMPARISON:  01/02/2019 FINDINGS: Artifact overlies the chest. Persistent bilateral patchy pulmonary infiltrates consistent with bronchopneumonia. Slight and slow radiographic improvement over the last several films. Small amount of pleural fluid on the left. No qualitatively new finding. IMPRESSION: Bilateral pneumonia, slowly improving by radiography. Electronically Signed   By: Nelson Chimes M.D.   On: 01/03/2019 06:55   ASSESSMENT AND PLAN:     1. Closed right hip fracture secondary to mechanical fall  Patient appears to have some swelling around her right hip. Patient seen by orthopedic service. Coagulopathy was reversed with vitamin K and INR down to 1.2. Patient seen by cardiologist.  Patient is chronically very high risk for  any surgical procedures.  No further cardiovascular risk assessment will be beneficial however.   Patient became hypoxic and hypotensive and had to be transferred to ICU previously Surgery currently on hold due to multiple medical problems listed below  2.  Acute on chronic chronic combined systolic  and diastolic CHF Patient being diuresed with IV Lasix twice daily initially.  Seen by nephrologist.  Patient did not respond well to Lasix drip and had to be started on CRRT by nephrology.  Improving   3.  Paroxysmal atrial fibrillation and history of mechanical mitral valve; AV paced Rate controlled.  Resumed amiodarone. Heparin drip resumed already to prevent CVA   4.  Acute on chronic hypoxic respiratory failure Patient had to be transferred to ICU recently due to hypoxia requiring 6 L of oxygen via nasal cannula.  Patient uses 3 L of oxygen at home at baseline.  5.  Acute blood loss anemia Hemoglobin gradually trending down and dropped to 6.9 this morning.  1 unit of packed red blood cells ordered this morning.  Follow-up on hemoglobin level in a.m.  6.  Acute kidney injury superimposed on chronic kidney disease stage III Nephrology already following and assisting with management. Patient started on CRRT and tolerating well.  7.  Left lower extremity chronic wounds. Being managed by surgical service.  DVT prophylaxis; On heparin drip  I updated patient's daughter present at bedside this morning.  Patient seen by palliative care team.  Patient now DNR. CODE STATUS: DNR  TOTAL TIME TAKING CARE OF THIS PATIENT: 36 minutes.   More than 50% of the time was spent in counseling/coordination of care: YES  POSSIBLE D/C IN 2-3 DAYS, DEPENDING ON CLINICAL CONDITION.   Chelbie Jarnagin M.D on 01/03/2019 at 11:33 AM  Between 7am to 6pm - Pager - [phone]  After 6pm go to [email or web address] - Proofreader  [facility]  Office  [phone]  CC: Primary care physician; Rusty Aus, MD  Note: This dictation was prepared with Dragon dictation along with smaller phrase technology. Any transcriptional errors that result from this process are unintentional.

## 2019-01-03 NOTE — Progress Notes (Signed)
Patient placed on 6l HFNC.

## 2019-01-03 NOTE — Progress Notes (Signed)
[facility], [place] 01/03/19  Subjective:  CRRT stopped earlier this a.m. at our instruction. Good ultrafiltration has been performed over the past several days. Overall patient still appears quite weak. Has been medically optimized but still not a candidate for surgery here at [facility].   Objective:  Vital signs in last 24 hours:  Temp:  [97.1 F (36.2 C)-98.6 F (37 C)] 98.1 F (36.7 C) (07/05 1210) Pulse Rate:  [59-97] 83 (07/05 1400) Resp:  [17-31] 18 (07/05 1400) BP: (92-141)/(41-114) 115/55 (07/05 1400) SpO2:  [93 %-100 %] 100 % (07/05 1400) FiO2 (%):  [40 %-50 %] 45 % (07/05 0825) Weight:  [65.7 kg] 65.7 kg (07/05 0500)  Weight change: 3.2 kg Filed Weights   01/01/19 0500 01/02/19 0500 01/03/19 0500  Weight: 70.3 kg 62.5 kg 65.7 kg    Intake/Output:    Intake/Output Summary (Last 24 hours) at 01/03/2019 1410 Last data filed at 01/03/2019 1300 Gross per 24 hour  Intake 608.39 ml  Output 1181 ml  Net -572.61 ml     Physical Exam: General:  Frail elderly lady, critically ill-appearing  HEENT  moist oral mucous membranes  Neck  supple no JVD  Pulm/lungs  bibasilar rales, on [place]  CVS/Heart  irregular, 3/6 SEM  Abdomen:   Soft, nontender  Extremities:  trace LE edema, right foot externally rotated  Neurologic:  Awake, alert, confused  Skin:  Warm, dry    Basic Metabolic Panel:  Recent Labs  Lab 01/02/19 1227 01/02/19 2110 01/03/19 0220 01/03/19 0504 01/03/19 0919  NA 137 140 139 139 138  K 4.2 4.0 4.1 3.9 4.0  CL 102 106 106 107 105  CO2 25 24 25 25 23   GLUCOSE 95 86 83 82 87  BUN 25* 29* 29* 29* 28*  CREATININE 1.32* 1.43* 1.42* 1.29* 1.27*  CALCIUM 8.9 8.8* 8.7* 8.7* 9.0  MG 2.0 1.9 2.0 1.9 1.9  PHOS 2.3* 2.5 2.4* 2.4* 2.3*     CBC: Recent Labs  Lab 12/30/18 0823  12/30/18 2343 12/31/18 0446 01/01/19 0452 01/02/19 0506 01/03/19 0504  WBC 9.6  --   --  13.9* 11.5* 18.5* 11.1*  NEUTROABS  --   --   --   --  9.9*  16.3*  --   HGB 7.4*   < > 7.8* 7.8* 7.8* 8.0* 6.9*  HCT 24.4*   < > 25.6* 25.5* 26.3* 27.2* 23.1*  MCV 87.8  --   --  87.3 90.4 89.5 89.9  PLT 122*  --   --  142* 131* 151 123*   < > = values in this interval not displayed.     No results found for: HEPBSAG, HEPBSAB, HEPBIGM    Microbiology:  Recent Results (from the past 240 hour(s))  SARS Coronavirus 2 (CEPHEID- Performed in [facility] hospital lab), Hosp Order     Status: None   Collection Time: 12/26/18 12:33 AM   Specimen: Nasopharyngeal Swab  Result Value Ref Range Status   SARS Coronavirus 2 NEGATIVE NEGATIVE Final    Comment: (NOTE) If result is NEGATIVE SARS-CoV-2 target nucleic acids are NOT DETECTED. The SARS-CoV-2 RNA is generally detectable in upper and lower  respiratory specimens during the acute phase of infection. The lowest  concentration of SARS-CoV-2 viral copies this assay can detect is 250  copies / mL. A negative result does not preclude SARS-CoV-2 infection  and should not be used as the sole basis for treatment or other  patient management decisions.  A negative result may occur with  improper specimen collection / handling, submission of specimen other  than nasopharyngeal swab, presence of viral mutation(s) within the  areas targeted by this assay, and inadequate number of viral copies  (<250 copies / mL). A negative result must be combined with clinical  observations, patient history, and epidemiological information. If result is POSITIVE SARS-CoV-2 target nucleic acids are DETECTED. The SARS-CoV-2 RNA is generally detectable in upper and lower  respiratory specimens dur ing the acute phase of infection.  Positive  results are indicative of active infection with SARS-CoV-2.  Clinical  correlation with patient history and other diagnostic information is  necessary to determine patient infection status.  Positive results do  not rule out bacterial infection or co-infection with other viruses. If  result is PRESUMPTIVE POSTIVE SARS-CoV-2 nucleic acids MAY BE PRESENT.   A presumptive positive result was obtained on the submitted specimen  and confirmed on repeat testing.  While 2019 novel coronavirus  (SARS-CoV-2) nucleic acids may be present in the submitted sample  additional confirmatory testing may be necessary for epidemiological  and / or clinical management purposes  to differentiate between  SARS-CoV-2 and other Sarbecovirus currently known to infect humans.  If clinically indicated additional testing with an alternate test  methodology [id number]) is advised. The SARS-CoV-2 RNA is generally  detectable in upper and lower respiratory sp ecimens during the acute  phase of infection. The expected result is Negative. Fact Sheet for Patients:  StrictlyIdeas.no Fact Sheet for Healthcare Providers: BankingDealers.co.za This test is not yet approved or cleared by the [place] FDA and has been authorized for detection and/or diagnosis of SARS-CoV-2 by FDA under an Emergency Use Authorization (EUA).  This EUA will remain in effect (meaning this test can be used) for the duration of the COVID-19 declaration under Section 564(b)(1) of the Act, 21 U.S.C. section 360bbb-3(b)(1), unless the authorization is terminated or revoked sooner. Performed at [facility], [address], [place] [address]   MRSA PCR Screening     Status: None   Collection Time: 12/26/18  1:54 AM   Specimen: Nasal Mucosa; Nasopharyngeal  Result Value Ref Range Status   MRSA by PCR NEGATIVE NEGATIVE Final    Comment:        The GeneXpert MRSA Assay (FDA approved for NASAL specimens only), is one component of a comprehensive MRSA colonization surveillance program. It is not intended to diagnose MRSA infection nor to guide or monitor treatment for MRSA infections. Performed at [facility], [address] [place], [place]  [address]   CULTURE, BLOOD (ROUTINE X 2) w Reflex to ID Panel     Status: None   Collection Time: 12/28/18  3:09 PM   Specimen: BLOOD  Result Value Ref Range Status   Specimen Description BLOOD RIGHT ANTECUBITAL  Final   Special Requests   Final    BOTTLES DRAWN AEROBIC AND ANAEROBIC Blood Culture adequate volume   Culture   Final    NO GROWTH 5 DAYS Performed at [facility], [address] [place], [place] [address]    Report Status 01/02/2019 FINAL  Final  CULTURE, BLOOD (ROUTINE X 2) w Reflex to ID Panel     Status: None   Collection Time: 12/28/18  3:19 PM   Specimen: BLOOD  Result Value Ref Range Status   Specimen Description BLOOD BLOOD RIGHT HAND  Final   Special Requests   Final    BOTTLES DRAWN AEROBIC AND  ANAEROBIC Blood Culture adequate volume   Culture   Final    NO GROWTH 5 DAYS Performed at [facility], [address] [place], [place] [address]    Report Status 01/02/2019 FINAL  Final  CULTURE, BLOOD (ROUTINE X 2) w Reflex to ID Panel     Status: None (Preliminary result)   Collection Time: 01/02/19  9:49 AM   Specimen: BLOOD  Result Value Ref Range Status   Specimen Description BLOOD RIGHT ANTECUBITAL  Final   Special Requests   Final    BOTTLES DRAWN AEROBIC AND ANAEROBIC Blood Culture results may not be optimal due to an excessive volume of blood received in culture bottles   Culture   Final    NO GROWTH < 24 HOURS Performed at [facility], [address], [place], [place] [address]    Report Status PENDING  Incomplete  CULTURE, BLOOD (ROUTINE X 2) w Reflex to ID Panel     Status: None (Preliminary result)   Collection Time: 01/02/19 10:00 AM   Specimen: BLOOD  Result Value Ref Range Status   Specimen Description BLOOD BLOOD RIGHT HAND  Final   Special Requests   Final    BOTTLES DRAWN AEROBIC AND ANAEROBIC Blood Culture results may not be optimal due to an excessive volume of blood received in culture bottles   Culture    Final    NO GROWTH < 24 HOURS Performed at [facility], [address] [place], [place] [address]    Report Status PENDING  Incomplete  Culture, respiratory     Status: None (Preliminary result)   Collection Time: 01/02/19  3:08 PM   Specimen: INDUCED SPUTUM  Result Value Ref Range Status   Specimen Description   Final    INDUCED SPUTUM Performed at [facility], [address] [place], [place] [address]    Special Requests   Final    NONE Performed at [facility], [address] [place], [place] [address]    Gram Stain   Final    RARE WBC PRESENT, PREDOMINANTLY PMN FEW GRAM POSITIVE COCCI IN PAIRS FEW YEAST RARE GRAM POSITIVE RODS    Culture   Final    TOO YOUNG TO READ Performed at [facility], [place] [address] [place],  [address]    Report Status PENDING  Incomplete    Coagulation Studies: No results for input(s): LABPROT, INR in the last 72 hours.  Urinalysis: No results for input(s): COLORURINE, LABSPEC, PHURINE, GLUCOSEU, HGBUR, BILIRUBINUR, KETONESUR, PROTEINUR, UROBILINOGEN, NITRITE, LEUKOCYTESUR in the last 72 hours.  Invalid input(s): APPERANCEUR    Imaging: Dg Chest Port 1 View  Result Date: 01/03/2019 CLINICAL DATA:  Acute respiratory failure. EXAM: PORTABLE CHEST 1 VIEW COMPARISON:  01/02/2019 FINDINGS: Artifact overlies the chest. Persistent bilateral patchy pulmonary infiltrates consistent with bronchopneumonia. Slight and slow radiographic improvement over the last several films. Small amount of pleural fluid on the left. No qualitatively new finding. IMPRESSION: Bilateral pneumonia, slowly improving by radiography. Electronically Signed   By: Nelson Chimes M.D.   On: 01/03/2019 06:55   Dg Chest Port 1 View  Result Date: 01/02/2019 CLINICAL DATA:  Acute respiratory failure. EXAM: PORTABLE CHEST 1 VIEW COMPARISON:  Chest x-ray dated December 31, 2018. FINDINGS: Unchanged left chest wall pacemaker and cardiomegaly.  Prior mitral valve replacement. Diffuse bilateral interstitial and airspace disease, mildly improved in the right upper lobe. Unchanged small bilateral pleural effusions. No pneumothorax. No acute osseous abnormality. IMPRESSION:  1. Persistent diffuse bilateral interstitial and airspace disease, mildly improved in the right upper lobe. Electronically Signed   By: Titus Dubin M.D.   On: 01/02/2019 08:40     Medications:   . albumin human Stopped (01/02/19 1500)  . ascorbic acid 500 mg in sodium chloride 0.9% 50 ml 0 mg (12/31/18 2041)  . azithromycin 500 mg (01/03/19 1013)  . heparin 1,450 Units/hr (01/03/19 1048)  . meropenem (MERREM) IV Stopped (01/03/19 0908)  . norepinephrine (LEVOPHED) Adult infusion Stopped (12/31/18 1543)   . sodium chloride   Intravenous Once  . sodium chloride   Intravenous Once  . sodium chloride   Intravenous Once  . amiodarone  200 mg Oral Daily  . Chlorhexidine Gluconate Cloth  6 each Topical Daily  . cholecalciferol  2,000 Units Oral Daily  . digoxin  0.125 mg Intravenous Daily  . feeding supplement (ENSURE ENLIVE)  237 mL Oral BID BM  . ipratropium-albuterol  3 mL Nebulization TID  . Melatonin  5 mg Oral QHS  . midodrine  5 mg Oral TID WC  . multivitamin-lutein  1 capsule Oral Daily  . polyethylene glycol  17 g Oral Daily  . thiamine injection  100 mg Intravenous Daily   acetaminophen **OR** acetaminophen, albuterol, heparin, ondansetron **OR** ondansetron (ZOFRAN) IV, traMADol, traZODone  Assessment/ Plan:  84 y.o.Caucasian  female with congestive heart failure EF 25%, chronic kidney disease, hypertension, atrial fibrillation, lymphedema, mitral valve replacement (mechanical valve) requiring chronic anticoagulation, COPD, osteoporosis, history of multiple lumbar compression fractures requiring kyphoplasty, history of left leg hematoma with evacuation and porcine allograft, was admitted on 12/25/2018 with right hip fracture.   1.  Acute kidney  injury, chronic kidney disease stage III.  Baseline 1.37/GFR 36 from December 15, 2018 2.  Chronic systolic congestive heart failure with EF 25% 3.  Lower extremity edema with third spacing 4.  Pulmonary hypertension 5.  Mechanical heart valve requiring chronic anticoagulation  -Patient unfortunately remains anuric at this time.  She has been weaned off of pressors.  She has tolerated CRRT well over the past several days.  Patient has been maintained net negative over the past several days.  Despite medical optimization it appears that she is still not a candidate for hip repair here at [facility].  Family potentially considering hospice.  For now we will consider hemodialysis tomorrow.  Further plan as patient progresses.   LOS: 9 Joselinne Lawal 7/5/20202:10 PM  [place], [place]  Note: This note was prepared with Dragon dictation. Any transcription errors are unintentional

## 2019-01-03 NOTE — Progress Notes (Signed)
ANTICOAGULATION CONSULT NOTE  Pharmacy Consult for Heparin Drip Indication: mechanical Mitral Valve/A.fib  Patient Measurements: Height: 4\' 11"  (149.9 cm) Weight: 144 lb 13.5 oz (65.7 kg) IBW/kg (Calculated) : 43.2 Heparin Dosing Weight: 57.9kg  Vital Signs: Temp: 97.9 F (36.6 C) (07/05 1600) Temp Source: Axillary (07/05 1600) BP: 112/49 (07/05 1600) Pulse Rate: 82 (07/05 1600)  Labs: Recent Labs    01/01/19 0452  01/02/19 0506  01/02/19 2357 01/03/19 0220 01/03/19 0504 01/03/19 0919 01/03/19 1050 01/03/19 1853  HGB 7.8*  --  8.0*  --   --   --  6.9*  --   --   --   HCT 26.3*  --  27.2*  --   --   --  23.1*  --   --   --   PLT 131*  --  151  --   --   --  123*  --   --   --   HEPARINUNFRC  --    < > 0.19*   < > 0.25*  --   --   --  0.33 0.47  CREATININE 0.80   < > 1.24*   < >  --  1.42* 1.29* 1.27*  --   --    < > = values in this interval not displayed.    Estimated Creatinine Clearance: 28.6 mL/min (A) (by C-G formula based on SCr of 1.27 mg/dL (H)).   Medical History: Past Medical History:  Diagnosis Date  . Arthritis    "back, hands, knees" (04/10/2016)  . Chronic back pain   . 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 EF 25% by echo in 08/2015 b. RHC in 08/2015 showed normal filling pressures; c. 10/2018 Echo (in setting of atrial tachycardia): EF 25-30%, impaired relaxation. Glob HK. RVSP 57mmHg. Mod dil RA/LA. Mech MV w/ nl fxn (mean grad 3mmHg). Mod TR.   Marland Kitchen Chronic kidney disease   . Compression fracture    "several; all in my back" (04/10/2016)  . GERD (gastroesophageal reflux disease)   . Hypertension   . Lymphedema   . Pacemaker 05/2017  . PAF (paroxysmal atrial fibrillation) ([place])    a. On amio & Coumadin - CHA2DS2VASc = 4.  . S/P MVR (mitral valve replacement)    a. MVR 1994 b. redo MVR in 08/2014 - on Coumadin  . Shortness of breath dyspnea     Assessment: Closed right hip fracture secondary  to mechanical fall  Coagulopathy was reversed with vitamin K and INR down to 1.2 on 6/28.-Patient on Coumadin prior to admission Paroxysmal atrial fibrillation and history of mechanical mitral valve; AV paced Rate controlled. Given history of mechanical mitral valve, patient was previously started on Heparin on 6/28 but was stopped on 6/29 due to significant drop in hemoglobin(7.8>6.1). Patient has since received PRBC transfusion and Hgb is currently 6.9. Per Dr. Saunders Revel, it is safe to restart heparin drip and has placed the consult for Pharmacy to re-initiate  Goal of Therapy:  Heparin level 0.3-0.7 units/ml Monitor platelets  Plan:  07/05 1050 HL 0.33 therapeutic x 1  continue rate at 1450 units/hr and recheck HL at 1900  CBC w/ am labs: watch H&H (Hgb 6.91, received 1 unit of PRBCs this morning) & platelets as level has decreased from yesterday  7/5:  HL @ 1853 = 0.47 Will continue pt on current rate and recheck HL on 7/6 with AM labs.   Pharmacy will continue to monitor and  adjust per consult.   Laraine Samet D Clinical Pharmacist 01/03/2019 7:53 PM

## 2019-01-03 NOTE — Progress Notes (Signed)
CRRT stopped at 1100 per Dr Holley Raring,  We will try a session of HD Monday.  Planned rinseback performed, lines packed with heparin and labelled.  Family informed of changes

## 2019-01-03 NOTE — Progress Notes (Addendum)
Marland Kitchen                                                                                                                                                                              CRITICAL CARE NOTE        SUBJECTIVE FINDINGS & SIGNIFICANT EVENTS   -84 year old female with a history of severe systolic CHF, mechanical mitral valve, atrial fibrillation on Eliquis chronic hypoxemia on 4L at home admitted to MICU for worsening hypoxemia post right hip fracture.  -Very high risk for surgery and anesthesia have medically optimized for safe transport and arranged for [facility] transfer however patient family now decided to make hospice.   -Plan to move to hospice floor in am as per family request.    PAST MEDICAL HISTORY   Past Medical History:  Diagnosis Date  . Arthritis    "back, hands, knees" (04/10/2016)  . Chronic back pain   . 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 EF 25% by echo in 08/2015 b. RHC in 08/2015 showed normal filling pressures; c. 10/2018 Echo (in setting of atrial tachycardia): EF 25-30%, impaired relaxation. Glob HK. RVSP 24mmHg. Mod dil RA/LA. Mech MV w/ nl fxn (mean grad 23mmHg). Mod TR.   Marland Kitchen Chronic kidney disease   . Compression fracture    "several; all in my back" (04/10/2016)  . GERD (gastroesophageal reflux disease)   . Hypertension   . Lymphedema   . Pacemaker 05/2017  . PAF (paroxysmal atrial fibrillation) ([place])    a. On amio & Coumadin - CHA2DS2VASc = 4.  . S/P MVR (mitral valve replacement)    a. MVR 1994 b. redo MVR in 08/2014 - on Coumadin  . Shortness of breath dyspnea      SURGICAL HISTORY   Past Surgical History:  Procedure Laterality Date  . BREAST LUMPECTOMY Right 2005?   benign lump excision  . CARDIAC CATHETERIZATION    . [facility], 2016   "MVR; MVR"  . CARDIOVERSION N/A 11/06/2018   Procedure: CARDIOVERSION;  Surgeon: Minna Merritts, MD;  Location: [facility]  ORS;  Service: Cardiovascular;  Laterality: N/A;  . CATARACT EXTRACTION W/PHACO Right 02/20/2015   Procedure: CATARACT EXTRACTION PHACO AND INTRAOCULAR LENS PLACEMENT ([place]);  Surgeon: Estill Cotta, MD;  Location: [facility] ORS;  Service: Ophthalmology;  Laterality: Right;  US01:31.2 AP 25.3% CDE40.13 Fluid lot # [id number]  . DRESSING CHANGE UNDER ANESTHESIA Left 11/17/2018   Procedure: DRESSING CHANGE;  Surgeon: Robert Bellow, MD;  Location: [facility] ORS;  Service: General;  Laterality: Left;  no anesthesia  . ELECTROPHYSIOLOGIC STUDY N/A 10/09/2015   Procedure: CARDIOVERSION;  Surgeon: Wellington Hampshire,  MD;  Location: [facility] ORS;  Service: Cardiovascular;  Laterality: N/A;  . INCISION AND DRAINAGE OF WOUND Left 11/13/2018   Procedure: LEFT LEG DRESSING CHANGE;  Surgeon: Robert Bellow, MD;  Location: [facility] ORS;  Service: General;  Laterality: Left;  . INCISION AND DRAINAGE OF WOUND Left 11/19/2018   Procedure: LEFT LEG DRESSING CHANGE AND GRAFT APPLICATION;  Surgeon: Robert Bellow, MD;  Location: [facility] ORS;  Service: General;  Laterality: Left;  . IR GENERIC HISTORICAL  04/01/2016   IR RADIOLOGIST EVAL & MGMT 04/01/2016 [facility]  . IR GENERIC HISTORICAL  04/15/2016   IR VERTEBROPLASTY CERV/THOR BX INC UNI/BIL INC/INJECT/IMAGING 04/15/2016 Luanne Bras, MD [facility]  . IR GENERIC HISTORICAL  04/15/2016   IR VERTEBROPLASTY EA ADDL (T&LS) BX INC UNI/BIL INC INJECT/IMAGING 04/15/2016 Luanne Bras, MD [facility]  . IR GENERIC HISTORICAL  04/15/2016   IR VERTEBROPLASTY EA ADDL (T&LS) BX INC UNI/BIL INC INJECT/IMAGING 04/15/2016 Luanne Bras, MD [facility]  . IR GENERIC HISTORICAL  05/20/2016   IR RADIOLOGIST EVAL & MGMT 05/20/2016 [facility]  . IR GENERIC HISTORICAL  06/13/2016   IR VERTEBROPLASTY EA ADDL (T&LS) BX INC UNI/BIL INC INJECT/IMAGING 06/13/2016 Luanne Bras, MD [facility]  . IR GENERIC HISTORICAL  06/13/2016   IR SACROPLASTY BILATERAL  06/13/2016 Luanne Bras, MD [facility]  . IR GENERIC HISTORICAL  06/13/2016   IR VERTEBROPLASTY LUMBAR BX INC UNI/BIL INC/INJECT/IMAGING 06/13/2016 Luanne Bras, MD [facility]  . IRRIGATION AND DEBRIDEMENT HEMATOMA Left 07/05/2015   Procedure: IRRIGATION AND DEBRIDEMENT HEMATOMA;  Surgeon: Robert Bellow, MD;  Location: [facility] ORS;  Service: General;  Laterality: Left;  . IRRIGATION AND DEBRIDEMENT HEMATOMA Left 11/11/2018   Procedure: IRRIGATION AND DEBRIDEMENT LEFT LEG HEMATOMA;  Surgeon: Robert Bellow, MD;  Location: [facility] ORS;  Service: General;  Laterality: Left;  . pyloric stenosis  07/1937  . US ECHOCARDIOGRAPHY       FAMILY HISTORY   Family History  Problem Relation Age of Onset  . Stroke Mother   . Renal Disease Father      SOCIAL HISTORY   Social History   Tobacco Use  . Smoking status: Former Smoker    Packs/day: 1.00    Years: 27.00    Pack years: 27.00    Types: Cigarettes    Quit date: 07/01/1980    Years since quitting: 38.5  . Smokeless tobacco: Never Used  . Tobacco comment: "quit smoking ~ 1980  Substance Use Topics  . Alcohol use: Not Currently    Alcohol/week: 0.0 standard drinks    Comment: 04/10/2016 "I'll have a drink on holidays/special occasions"  . Drug use: No     MEDICATIONS   Current Medication:  Current Facility-Administered Medications:  .  0.9 %  sodium chloride infusion (Manually program via Guardrails IV Fluids), , Intravenous, Once, Ojie, Jude, MD .  0.9 %  sodium chloride infusion (Manually program via Guardrails IV Fluids), , Intravenous, Once, Kaede Clendenen, MD .  0.9 %  sodium chloride infusion (Manually program via Guardrails IV Fluids), , Intravenous, Once, Tukov-Yual, Magdalene S, NP .  acetaminophen (TYLENOL) tablet 650 mg, 650 mg, Oral, Q6H PRN, 650 mg at 12/29/18 2216 **OR** acetaminophen (TYLENOL) suppository 650 mg, 650 mg, Rectal, Q6H PRN, Lance Coon, MD .  albumin human 25 % solution 12.5 g, 12.5  g, Intravenous, Daily, Caylie Sandquist, MD, Stopped at 01/02/19 1500 .  albuterol (PROVENTIL) (2.5 MG/3ML) 0.083% nebulizer solution 2.5 mg, 2.5 mg, Nebulization, Q2H PRN, Zain Bingman,  MD .  amiodarone (PACERONE) tablet 200 mg, 200 mg, Oral, Daily, Ojie, Jude, MD, 200 mg at 01/01/19 1012 .  ascorbic acid 500 mg in sodium chloride 0.9% 50 ml, 500 mg, Intravenous, Daily, Lanney Gins, Karolyn Messing, MD, Last Rate: 0 mL/hr at 12/31/18 2041, 500 mg at 01/02/19 0925 .  azithromycin (ZITHROMAX) 500 mg in sodium chloride 0.9 % 250 mL IVPB, 500 mg, Intravenous, Q24H, Ottie Glazier, MD, Stopped at 01/02/19 1114 .  Chlorhexidine Gluconate Cloth 2 % PADS 6 each, 6 each, Topical, Daily, Ottie Glazier, MD, 6 each at 12/31/18 0919 .  cholecalciferol (VITAMIN D3) tablet 2,000 Units, 2,000 Units, Oral, Daily, Ojie, Jude, MD, 2,000 Units at 01/01/19 1014 .  digoxin (LANOXIN) 0.25 MG/ML injection 0.125 mg, 0.125 mg, Intravenous, Daily, Lanney Gins, Isair Inabinet, MD, 0.125 mg at 01/02/19 1009 .  feeding supplement (ENSURE ENLIVE) (ENSURE ENLIVE) liquid 237 mL, 237 mL, Oral, BID BM, Ojie, Jude, MD, 237 mL at 01/01/19 1412 .  heparin ADULT infusion 100 units/mL (25000 units/233mL sodium chloride 0.45%), 1,450 Units/hr, Intravenous, Continuous, Ojie, Jude, MD, Last Rate: 14.5 mL/hr at 01/03/19 0352, 1,450 Units/hr at 01/03/19 0352 .  heparin injection 1,000-6,000 Units, 1,000-6,000 Units, CRRT, PRN, Holley Raring, Munsoor, MD, 1,400 Units at 01/02/19 1606 .  ipratropium-albuterol (DUONEB) 0.5-2.5 (3) MG/3ML nebulizer solution 3 mL, 3 mL, Nebulization, TID, Lanney Gins, Evonne Rinks, MD, 3 mL at 01/02/19 1457 .  Melatonin TABS 5 mg, 5 mg, Oral, QHS, Tukov-Yual, Magdalene S, NP .  meropenem (MERREM) 1 g in sodium chloride 0.9 % 100 mL IVPB, 1 g, Intravenous, Q12H, Orene Desanctis, RPH, Stopped at 01/02/19 2241 .  midodrine (PROAMATINE) tablet 5 mg, 5 mg, Oral, TID WC, Shiesha Jahn, MD, 5 mg at 01/02/19 1159 .  multivitamin-lutein (OCUVITE-LUTEIN)  capsule 1 capsule, 1 capsule, Oral, Daily, Ojie, Jude, MD, 1 capsule at 01/01/19 1015 .  norepinephrine (LEVOPHED) 4mg  in 251mL premix infusion, 0-40 mcg/min, Intravenous, Titrated, Bradly Bienenstock, NP, Stopped at 12/31/18 1543 .  ondansetron (ZOFRAN) tablet 4 mg, 4 mg, Oral, Q6H PRN **OR** ondansetron (ZOFRAN) injection 4 mg, 4 mg, Intravenous, Q6H PRN, Lance Coon, MD .  phosphorus (K PHOS NEUTRAL) tablet 500 mg, 500 mg, Oral, Q1 Hr x 2, Ojie, Jude, MD .  polyethylene glycol (MIRALAX / GLYCOLAX) packet 17 g, 17 g, Oral, Daily, Ojie, Jude, MD, 17 g at 01/01/19 1012 .  pureflow IV solution for Dialysis, , CRRT, Continuous, Lateef, Munsoor, MD, Last Rate: 1,500 mL/hr at 01/02/19 0526 .  thiamine (B-1) injection 100 mg, 100 mg, Intravenous, Daily, Lanney Gins, Orby Tangen, MD, 100 mg at 01/02/19 1009 .  traMADol (ULTRAM) tablet 50 mg, 50 mg, Oral, Q12H PRN, Stark Jock, Jude, MD, 50 mg at 12/31/18 0906 .  traZODone (DESYREL) tablet 75 mg, 75 mg, Oral, QHS PRN, Bradly Bienenstock, NP    ALLERGIES   Ace inhibitors, Doxycycline, Hydrocodone, Mercury, Silver, Cephalexin, Clindamycin/lincomycin, Nickel, and Penicillins    REVIEW OF SYSTEMS     10 point ROS done and is negative except for R hip pain and mild aggitation.   PHYSICAL EXAMINATION   Vitals:   01/03/19 0500 01/03/19 0600  BP: (!) 92/41 (!) 122/57  Pulse: (!) 59 64  Resp: (!) 21 18  Temp:    SpO2: 100% 94%    GENERAL:Mild distress due to restlessness with confusion and sundowning HEAD: Normocephalic, atraumatic.  EYES: Pupils equal, round, reactive to light.  No scleral icterus.  MOUTH: Moist mucosal membrane. NECK: Supple. No thyromegaly. No nodules. No JVD.  PULMONARY:crackles at  bases bilaterally CARDIOVASCULAR: S1 and S2. Regular rate and rhythm. No murmurs, rubs, or gallops.  GASTROINTESTINAL: Soft, nontender, non-distended. No masses. Positive bowel sounds. No hepatosplenomegaly.  MUSCULOSKELETAL: Lower extremity edema with  previous left extremity hematoma and now right hip fracture with serosanguineous oozing from [facility] on the right NEUROLOGIC: Mild distress due to acute illness SKIN:intact,warm,dry   LABS AND IMAGING       CXR 01/02/19       12/31/18 -CXR     LAB RESULTS: Recent Labs  Lab 01/02/19 2110 01/03/19 0220 01/03/19 0504  NA 140 139 139  K 4.0 4.1 3.9  CL 106 106 107  CO2 24 25 25   BUN 29* 29* 29*  CREATININE 1.43* 1.42* 1.29*  GLUCOSE 86 83 82   Recent Labs  Lab 01/01/19 0452 01/02/19 0506 01/03/19 0504  HGB 7.8* 8.0* 6.9*  HCT 26.3* 27.2* 23.1*  WBC 11.5* 18.5* 11.1*  PLT 131* 151 123*     IMAGING RESULTS: No results found.    ASSESSMENT AND PLAN    -Multidisciplinary rounds held today  Acute on chronic hypoxemic respiratory failure -Due tochronicsystolic CHF with EF 25 to 30% -Cardiology on case appreciate input-follow recommendations -Currently undergoing cvvh with net negative fluid balance - phosphorus was decreasing with previous CRRT rate, this was decreased and phos has improved but creatinine is worsening now. Will discuss with nephrologist -Complicated byacute blood loss anemia, pulmonary edema, CKD, atelectasis, deconditioning, R hip fracture - MetaNEB BID today -serial CXR reviewed with improved Right pulmonary edema but slight worsening of left side.  -[facility] transport agreed to move patient tonight to cardiac step down unit but patient family canceled transfer, family wants patient to be hospice in am.     Chronic HFrEF with EF 74-25%  -Complicated by AF and mechMV requiring [facility] - cardiology on case - appreciate input - on Heparin gtt tolerating acceptably   Acute blood loss anemia   - due to R hip fracture and hematoma formation    - s/p 3 units prbc transfusion    - h/h borderline low but stable , h/h monitoring , transfuse as needed       Rt hip fracture      -discussed case and care plan with  Dr Marry Guan and anesthesiology Dr  Janett Billow - appreciate input    - high risk patient holding plan for surgery until further eval     Acute on chronic renal insufficiency -Has been tolerating CVVH well -Likely cardiorenal in origin -creatinine trending down with decreased rate on cvvh    Bilateral atelectasis -Contributing to hypoxemia - chest PT - MetaNeb BID - Incentive spirometer to be used each hour multiple times - please encourage to use  - patient has difficulty expectorating phlegm - will ask RT to bring Acapella device as well    Moderate protein calorie malnutrition and deconditioning -Contributing to dyspnea -Patient working with physical therapy was able to get out of bed and slowly walk today but encouraged to continue this as well as outpatient -Nutritional assessment patient has been aspirating and has been evaluated with modified barium swallow recommended aspiration precautions-s/p dietician evaluation - appreciate input     GI/Nutrition GI PROPHYLAXIS as indicated DIET-->TF's as tolerated Constipation protocol as indicated   ENDO - ICU hypoglycemic\Hyperglycemia protocol -check FSBS per protocol   ELECTROLYTES -follow labs as needed -replace as needed -pharmacy consultation   DVT/GI PRX ordered -SCDs  TRANSFUSIONS AS NEEDED MONITOR FSBS ASSESS the need for LABS as  needed   Critical care provider statement:    Critical care time (minutes):  32   Critical care time was exclusive of:  Separately billable procedures and treating other patients   Critical care was necessary to treat or prevent imminent or life-threatening deterioration of the following conditions:  acute hypoxemic respiratory failure, AKI, acute blood loss anemia, Rt hip fracture, multiple comorbid conditoins   Critical care was time spent personally by me on the following activities:  Development of treatment plan with patient or surrogate, discussions with consultants, evaluation of patient's response to  treatment, examination of patient, obtaining history from patient or surrogate, ordering and performing treatments and interventions, ordering and review of laboratory studies and re-evaluation of patient's condition.  I assumed direction of critical care for this patient from another provider in my specialty: no    This document was prepared using Dragon voice recognition software and may include unintentional dictation errors.    Ottie Glazier, M.D.  Division of [facility]

## 2019-01-03 NOTE — Progress Notes (Signed)
[facility] for Heparin Drip Indication: mechanical Mitral Valve/A.fib  Patient Measurements: Height: 4\' 11"  (149.9 cm) Weight: 137 lb 12.6 oz (62.5 kg) IBW/kg (Calculated) : 43.2 Heparin Dosing Weight: 57.9kg  Vital Signs: Temp: 98.6 F (37 C) (07/05 0000) Temp Source: Oral (07/05 0000) BP: 109/61 (07/05 0200) Pulse Rate: 76 (07/05 0200)  Labs: Recent Labs    12/31/18 0446  01/01/19 0452  01/02/19 0506  01/02/19 1227 01/02/19 1414 01/02/19 2110 01/02/19 2357 01/03/19 0220  HGB 7.8*  --  7.8*  --  8.0*  --   --   --   --   --   --   HCT 25.5*  --  26.3*  --  27.2*  --   --   --   --   --   --   PLT 142*  --  131*  --  151  --   --   --   --   --   --   HEPARINUNFRC  --    < >  --    < > 0.19*  --   --  0.39  --  0.25*  --   CREATININE 0.62   < > 0.80   < > 1.24*   < > 1.32*  --  1.43*  --  1.42*   < > = values in this interval not displayed.    Estimated Creatinine Clearance: 25 mL/min (A) (by C-G formula based on SCr of 1.42 mg/dL (H)).   Medical History: Past Medical History:  Diagnosis Date  . Arthritis    "back, hands, knees" (04/10/2016)  . Chronic back pain   . 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 EF 25% by echo in 08/2015 b. RHC in 08/2015 showed normal filling pressures; c. 10/2018 Echo (in setting of atrial tachycardia): EF 25-30%, impaired relaxation. Glob HK. RVSP 72mmHg. Mod dil RA/LA. Mech MV w/ nl fxn (mean grad 33mmHg). Mod TR.   Marland Kitchen Chronic kidney disease   . Compression fracture    "several; all in my back" (04/10/2016)  . GERD (gastroesophageal reflux disease)   . Hypertension   . Lymphedema   . Pacemaker 05/2017  . PAF (paroxysmal atrial fibrillation) ([place])    a. On amio & Coumadin - CHA2DS2VASc = 4.  . S/P MVR (mitral valve replacement)    a. MVR 1994 b. redo MVR in 08/2014 - on Coumadin  . Shortness of breath dyspnea     Assessment: Closed right hip  fracture secondary to mechanical fall  Coagulopathy was reversed with vitamin K and INR down to 1.2 on 6/28.-Patient on Coumadin prior to admission Paroxysmal atrial fibrillation and history of mechanical mitral valve; AV paced Rate controlled. Given history of mechanical mitral valve, patient was previously started on Heparin on 6/28 but was stopped on 6/29 due to significant drop in hemoglobin(7.8>6.1). Patient has since received PRBC transfusion and Hgb is currently 7.2. Per Dr. Saunders Revel, it is safe to restart heparin drip and has placed the consult for Pharmacy to re-initiate  Goal of Therapy:  Heparin level 0.3-0.7 units/ml Monitor platelets  Plan: 07/04 @ 2300 HL 0.25 subtherapeutic. Will increase rate to 1450 units/hr and recheck HL @ 1100, will check CBC w/ am labs.  Pharmacy will continue to monitor and adjust per consult.   Tobie Lords, PharmD, BCPS Clinical Pharmacist 01/03/2019 3:16 AM

## 2019-01-03 NOTE — Progress Notes (Signed)
Pt declines to turn fully for a full skin assessment at this time.  The bed is being utilized to off load her from side to side as much as possible and she is padded with pillows to keep her comfortable

## 2019-01-03 NOTE — Progress Notes (Signed)
CRRT Medication Adjustments:    Pharmacy consulted for medication adjustments for 84 yo female initiated on CRRT on 6/30.   07/05 @ 0500 K 3.9, Phos 2.4. will administer Kphos PO x 2 and recheck w/ q4h labs.  Pharmacy will continue to monitor and adjust per consult.   Tobie Lords, PharmD, BCPS Clinical Pharmacist 01/03/2019 6:34 AM

## 2019-01-03 NOTE — Progress Notes (Signed)
ANTICOAGULATION CONSULT NOTE  Pharmacy Consult for Heparin Drip Indication: mechanical Mitral Valve/A.fib  Patient Measurements: Height: 4\' 11"  (149.9 cm) Weight: 144 lb 13.5 oz (65.7 kg) IBW/kg (Calculated) : 43.2 Heparin Dosing Weight: 57.9kg  Vital Signs: Temp: 97.7 F (36.5 C) (07/05 0800) Temp Source: Axillary (07/05 0800) BP: 105/53 (07/05 1000) Pulse Rate: 72 (07/05 1000)  Labs: Recent Labs    01/01/19 0452  01/02/19 0506  01/02/19 1414  01/02/19 2357 01/03/19 0220 01/03/19 0504 01/03/19 0919  HGB 7.8*  --  8.0*  --   --   --   --   --  6.9*  --   HCT 26.3*  --  27.2*  --   --   --   --   --  23.1*  --   PLT 131*  --  151  --   --   --   --   --  123*  --   HEPARINUNFRC  --    < > 0.19*  --  0.39  --  0.25*  --   --   --   CREATININE 0.80   < > 1.24*   < >  --    < >  --  1.42* 1.29* 1.27*   < > = values in this interval not displayed.    Estimated Creatinine Clearance: 28.6 mL/min (A) (by C-G formula based on SCr of 1.27 mg/dL (H)).   Medical History: Past Medical History:  Diagnosis Date  . Arthritis    "back, hands, knees" (04/10/2016)  . Chronic back pain   . 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 EF 25% by echo in 08/2015 b. RHC in 08/2015 showed normal filling pressures; c. 10/2018 Echo (in setting of atrial tachycardia): EF 25-30%, impaired relaxation. Glob HK. RVSP 78mmHg. Mod dil RA/LA. Mech MV w/ nl fxn (mean grad 31mmHg). Mod TR.   Marland Kitchen Chronic kidney disease   . Compression fracture    "several; all in my back" (04/10/2016)  . GERD (gastroesophageal reflux disease)   . Hypertension   . Lymphedema   . Pacemaker 05/2017  . PAF (paroxysmal atrial fibrillation) ([place])    a. On amio & Coumadin - CHA2DS2VASc = 4.  . S/P MVR (mitral valve replacement)    a. MVR 1994 b. redo MVR in 08/2014 - on Coumadin  . Shortness of breath dyspnea     Assessment: Closed right hip fracture secondary to mechanical  fall  Coagulopathy was reversed with vitamin K and INR down to 1.2 on 6/28.-Patient on Coumadin prior to admission Paroxysmal atrial fibrillation and history of mechanical mitral valve; AV paced Rate controlled. Given history of mechanical mitral valve, patient was previously started on Heparin on 6/28 but was stopped on 6/29 due to significant drop in hemoglobin(7.8>6.1). Patient has since received PRBC transfusion and Hgb is currently 6.9. Per Dr. Saunders Revel, it is safe to restart heparin drip and has placed the consult for Pharmacy to re-initiate  Goal of Therapy:  Heparin level 0.3-0.7 units/ml Monitor platelets  Plan:  07/05 1050 HL 0.33 therapeutic x 1  continue rate at 1450 units/hr and recheck HL at 1900  CBC w/ am labs: watch H&H (Hgb 6.91, received 1 unit of PRBCs this morning) & platelets as level has decreased from yesterday  Pharmacy will continue to monitor and adjust per consult.   Vallery Sa, PharmD Clinical Pharmacist 01/03/2019 11:01 AM

## 2019-01-04 DIAGNOSIS — J810 Acute pulmonary edema: Secondary | ICD-10-CM

## 2019-01-04 DIAGNOSIS — S72001K Fracture of unspecified part of neck of right femur, subsequent encounter for closed fracture with nonunion: Secondary | ICD-10-CM

## 2019-01-04 DIAGNOSIS — J9601 Acute respiratory failure with hypoxia: Secondary | ICD-10-CM

## 2019-01-04 DIAGNOSIS — N184 Chronic kidney disease, stage 4 (severe): Secondary | ICD-10-CM

## 2019-01-04 DIAGNOSIS — N179 Acute kidney failure, unspecified: Secondary | ICD-10-CM

## 2019-01-04 LAB — TYPE AND SCREEN
ABO/RH(D): A POS
Antibody Screen: NEGATIVE
Unit division: 0

## 2019-01-04 LAB — CBC
HCT: 27.9 % — ABNORMAL LOW (ref 36.0–46.0)
Hemoglobin: 8 g/dL — ABNORMAL LOW (ref 12.0–15.0)
MCH: 26.8 pg (ref 26.0–34.0)
MCHC: 28.7 g/dL — ABNORMAL LOW (ref 30.0–36.0)
MCV: 93.3 fL (ref 80.0–100.0)
Platelets: 150 10*3/uL (ref 150–400)
RBC: 2.99 MIL/uL — ABNORMAL LOW (ref 3.87–5.11)
RDW: 18.7 % — ABNORMAL HIGH (ref 11.5–15.5)
WBC: 9.7 10*3/uL (ref 4.0–10.5)
nRBC: 0 % (ref 0.0–0.2)

## 2019-01-04 LAB — BPAM RBC
Blood Product Expiration Date: 202008052359
ISSUE DATE / TIME: 202007051208
Unit Type and Rh: 6200

## 2019-01-04 LAB — HEPARIN LEVEL (UNFRACTIONATED): Heparin Unfractionated: 0.4 IU/mL (ref 0.30–0.70)

## 2019-01-04 LAB — PREPARE RBC (CROSSMATCH)

## 2019-01-04 MED ORDER — PNEUMOCOCCAL VAC POLYVALENT 25 MCG/0.5ML IJ INJ: 0.5000 mL | INJECTION | INTRAMUSCULAR | Status: DC

## 2019-01-04 MED ORDER — ACETAMINOPHEN 10 MG/ML IV SOLN
1000.0000 mg | Freq: Four times a day (QID) | INTRAVENOUS | Status: AC | PRN
  Administered 2019-01-04: 1000 mg via INTRAVENOUS
  Filled 2019-01-04: qty 100

## 2019-01-04 MED ORDER — IPRATROPIUM-ALBUTEROL 0.5-2.5 (3) MG/3ML IN SOLN: 3.0000 mL | Freq: Four times a day (QID) | RESPIRATORY_TRACT | Status: DC | PRN

## 2019-01-04 MED ORDER — MELATONIN 5 MG PO TABS
5.0000 mg | ORAL_TABLET | Freq: Every evening | ORAL | Status: DC | PRN
  Filled 2019-01-04: qty 1

## 2019-01-04 MED ORDER — TRAMADOL HCL 50 MG PO TABS: 50.0000 mg | ORAL_TABLET | Freq: Four times a day (QID) | ORAL | Status: DC | PRN

## 2019-01-04 NOTE — Progress Notes (Addendum)
Pt and son Helene Kelp do not wish for patient to travel to the dialysis unit this evening and requested bedside tx.  I spoke with the HD RN and related to Barry that we do not have enough HD RNs at this time to do bedside treatments. He verbalized understanding that she will have to go to the unit in the morning for a treatment.  He is reluctant to sign the consent for HD at this time.  Helene Kelp expressed frustration because he stated he did not want patient to have any dialysis at all but was told by (someone?) that CRRT would be done just to remove excess fluid so she could have hip surgery and would not lead to long term HD.  He indicated her VS are where he was told they should be in order for her to have surgery.  I did remind him that his mother is requiring a fair amount of O2 support and is very weak; her BP is borderline at best.  He expresses understanding but seems very doubtful.  I am unsure at this time whether he and his sister will go forward with HD in the morning.

## 2019-01-04 NOTE — Progress Notes (Signed)
[facility] [place] NAME: [name]    MR#:  [id number]  DATE OF BIRTH:  [birth year]  SUBJECTIVE:  CHIEF COMPLAINT:   Chief Complaint  Patient presents with  . Fall  . Hip Pain   Appears somewhat confused but alert and complaining of pain.  REVIEW OF SYSTEMS:  Unobtainable due to underlying medical conditions.  DRUG ALLERGIES:   Allergies  Allergen Reactions  . Ace Inhibitors     Cough  . Doxycycline Nausea Only  . Hydrocodone Itching and Nausea Only  . Mercury     Other reaction(s): Unknown  . Silver Dermatitis    Severe itching  . Cephalexin Rash  . Clindamycin/Lincomycin Rash  . Nickel Rash  . Penicillins Rash    Has patient had a PCN reaction causing immediate rash, facial/tongue/throat swelling, SOB or lightheadedness with hypotension: YES Has patient had a PCN reaction causing severe rash involving mucus membranes or skin necrosis: NO Has patient had a PCN reaction that required hospitalization NO Has patient had a PCN reaction occurring within the last 10 years: NO If all of the above answers are "NO", then may proceed with Cephalosporin use.   VITALS:  Blood pressure (!) 105/44, pulse 62, temperature 97.9 F (36.6 C), temperature source Axillary, resp. rate (!) 23, height 4\' 11"  (1.499 m), weight 65.3 kg, SpO2 97 %. PHYSICAL EXAMINATION:   Physical Exam  Constitutional: She is well-developed, well-nourished, and in no distress.  HENT:  Head: Normocephalic and atraumatic.  Right Ear: External ear normal.  Eyes: Pupils are equal, round, and reactive to light. Conjunctivae are normal. Right eye exhibits no discharge.  Neck: Normal range of motion. Neck supple. No thyromegaly present.  Cardiovascular: Normal rate.  Irregularly irregular  Pulmonary/Chest: Effort normal and breath sounds normal. No respiratory distress.  Abdominal: Soft. Bowel sounds are normal. She exhibits no distension.   Musculoskeletal:        General: No edema.     Comments: Slight discomfort and ecchymosis around right hip fracture site  Neurological:  Pleasantly confused  Skin: Skin is warm and dry. She is not diaphoretic. No erythema.  Psychiatric: Affect and judgment normal.   LABORATORY PANEL:  Female CBC Recent Labs  Lab 01/04/19 0450  WBC 9.7  HGB 8.0*  HCT 27.9*  PLT 150   ------------------------------------------------------------------------------------------------------------------ Chemistries  Recent Labs  Lab 01/03/19 0919  NA 138  K 4.0  CL 105  CO2 23  GLUCOSE 87  BUN 28*  CREATININE 1.27*  CALCIUM 9.0  MG 1.9   RADIOLOGY:  No results found. ASSESSMENT AND PLAN:     1. Closed right hip fracture secondary to mechanical fall  Patient appears to have some swelling around her right hip. Patient seen by orthopedic service. Coagulopathy was reversed with vitamin K and INR down to 1.2. Patient seen by cardiologist.  Patient is chronically very high risk for any surgical procedures.  No further cardiovascular risk assessment will be beneficial however.   Patient became hypoxic and hypotensive and had to be transferred to ICU previously Surgery currently on hold due to multiple medical problems listed below Per ICU physician, Son still want to have the surgery done. Pt is on [facility] list at [facility] for surgery.  2.  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  Seen by nephrologist.  Patient did not respond well to Lasix drip and had to be  started on CRRT by nephrology.  Improving   3.  Paroxysmal atrial fibrillation and history of mechanical mitral valve; AV paced Rate controlled.  Resumed amiodarone. Heparin drip resumed already to prevent CVA   4.  Acute on chronic hypoxic respiratory failure Patient had to be transferred to ICU recently due to hypoxia requiring 6 L of oxygen via nasal cannula.   Patient uses 3 L of oxygen at home at baseline.  5.  Acute blood loss anemia Hemoglobin gradually trending down and dropped to 6.9 this morning.  1 unit of packed red blood cells ordered this morning.  Follow-up on hemoglobin level in a.m.  6.  Acute kidney injury superimposed on chronic kidney disease stage III Nephrology already following and assisting with management. Patient started on CRRT and tolerating well.  7.  Left lower extremity chronic wounds. Being managed by surgical service.  DVT prophylaxis; On heparin drip  I updated patient's daughter present at bedside this morning.  Patient seen by palliative care team.  Patient now DNR. CODE STATUS: DNR  TOTAL TIME TAKING CARE OF THIS PATIENT: 36 minutes.   More than 50% of the time was spent in counseling/coordination of care: YES  POSSIBLE D/C IN 2-3 DAYS, DEPENDING ON CLINICAL CONDITION.   [name] M.D on 01/04/2019 at 4:45 PM  Between 7am to 6pm - Pager - [phone]  After 6pm go to [email or web address] - Proofreader  [facility]  Office  [phone]  CC: Primary care physician; Rusty Aus, MD  Note: This dictation was prepared with Dragon dictation along with smaller phrase technology. Any transcriptional errors that result from this process are unintentional.

## 2019-01-04 NOTE — Progress Notes (Signed)
Progress Note  Patient Name: [name] Date of Encounter: 01/04/2019  Primary Cardiologist: Ida Rogue, MD   Subjective   I was asked by Dr. Alva Garnet to evaluate the patient's surgical risk for hip surgery and following complex medical and cardiac issues.  The patient is awake but closes her eyes frequently.  She appears very deconditioned.  Inpatient Medications    Scheduled Meds: . amiodarone  200 mg Oral Daily  . Chlorhexidine Gluconate Cloth  6 each Topical Daily  . cholecalciferol  2,000 Units Oral Daily  . multivitamin-lutein  1 capsule Oral Daily   Continuous Infusions: . acetaminophen 1,000 mg (01/04/19 1313)  . ascorbic acid 500 mg in sodium chloride 0.9% 50 ml 0 mg (12/31/18 2041)  . heparin 1,450 Units/hr (01/04/19 1200)  . meropenem (MERREM) IV 1 g (01/04/19 0953)   PRN Meds: acetaminophen, acetaminophen **OR** acetaminophen, heparin, ipratropium-albuterol, Melatonin, [DISCONTINUED] ondansetron **OR** ondansetron (ZOFRAN) IV, traMADol   Vital Signs    Vitals:   01/04/19 1000 01/04/19 1100 01/04/19 1200 01/04/19 1300  BP: (!) 121/50 (!) 117/51 (!) 106/49 (!) 110/50  Pulse: 83 81 64 60  Resp: (!) 26 (!) 26 (!) 29 (!) 21  Temp:   97.9 F (36.6 C)   TempSrc:   Axillary   SpO2: 96% 97% 93% 97%  Weight:      Height:        Intake/Output Summary (Last 24 hours) at 01/04/2019 1420 Last data filed at 01/04/2019 1413 Gross per 24 hour  Intake 1079.55 ml  Output 50 ml  Net 1029.55 ml   Filed Weights   01/02/19 0500 01/03/19 0500 01/04/19 0458  Weight: 62.5 kg 65.7 kg 65.3 kg    Telemetry    AV paced, 60s - Personally Reviewed  ECG    No new tracings - Personally Reviewed  Physical Exam   GEN: No acute distress.  Frail elderly female, lying in bed.  No family at bedside at this time. Neck: Elevated JVP ~11cm. Cardiac: RRR, 2/6 systolic murmur best appreciated in the left sternal border. No rubs, or gallops.  Respiratory: Bibasilar  diminished breath sounds. GI: Soft, nontender, non-distended  MS: 1-2+ bilateral posterior femur edema with R>L and left lower leg wrapped with ACE bandage; No deformity. Neuro:  Nonfocal  Psych: Normal affect   Labs    Chemistry Recent Labs  Lab 01/03/19 0220 01/03/19 0504 01/03/19 0919  NA 139 139 138  K 4.1 3.9 4.0  CL 106 107 105  CO2 25 25 23   GLUCOSE 83 82 87  BUN 29* 29* 28*  CREATININE 1.42* 1.29* 1.27*  CALCIUM 8.7* 8.7* 9.0  ALBUMIN 3.2* 3.1* 3.4*  GFRNONAA 35* 39* 40*  GFRAA 40* 45* 46*  ANIONGAP 8 7 10      Hematology Recent Labs  Lab 01/02/19 0506 01/03/19 0504 01/04/19 0450  WBC 18.5* 11.1* 9.7  RBC 3.04* 2.57* 2.99*  HGB 8.0* 6.9* 8.0*  HCT 27.2* 23.1* 27.9*  MCV 89.5 89.9 93.3  MCH 26.3 26.8 26.8  MCHC 29.4* 29.9* 28.7*  RDW 18.7* 19.6* 18.7*  PLT 151 123* 150    Cardiac EnzymesNo results for input(s): TROPONINI in the last 168 hours. No results for input(s): TROPIPOC in the last 168 hours.   BNPNo results for input(s): BNP, PROBNP in the last 168 hours.   DDimer No results for input(s): DDIMER in the last 168 hours.   Radiology    Dg Chest Port 1 View  Result Date: 01/03/2019 CLINICAL  DATA:  Acute respiratory failure. EXAM: PORTABLE CHEST 1 VIEW COMPARISON:  01/02/2019 FINDINGS: Artifact overlies the chest. Persistent bilateral patchy pulmonary infiltrates consistent with bronchopneumonia. Slight and slow radiographic improvement over the last several films. Small amount of pleural fluid on the left. No qualitatively new finding. IMPRESSION: Bilateral pneumonia, slowly improving by radiography. Electronically Signed   By: Nelson Chimes M.D.   On: 01/03/2019 06:55    Cardiac Studies   TTE 11/05/2018  1. The left ventricle has severely reduced systolic function, with an ejection fraction of 25-30%. The cavity size was mildly dilated. Left ventricular diastolic Doppler parameters are consistent with impaired relaxation. Left ventrical global   hypokinesis without regional wall motion abnormalities.  2. The right ventricle has mildly reduced systolic function. The cavity was moderately enlarged. There is no increase in right ventricular wall thickness. Right ventricular systolic pressure is moderately elevated with an estimated pressure of 53 mmHg.  3. Right atrial size was moderately dilated.  4. Left atrial size was moderately dilated.  5. The aortic valve was not well visualized. Moderate calcification of the aortic valve. Aortic valve regurgitation was not assessed by color flow Doppler. Unable to estimate adegree of aortic valve stenosis given severely reduced systolic function.  6. Mechanical mitral valve. Mean gradient 5 mm Hg.  7. Tricuspid valve regurgitation is moderate.  8. Left pleural effusion noted, 6.5 cm  9. Rhythm is atrial tachycardia, rate 105 bpm   Patient Profile     84 y.o. female with a history of chronic combined CHF secondary to an ICM s/p CRT-P, PAF, LBBB, mitral valve disease s/p mechanical mitral valve (previously on Coumadin), recent spontaneous left calf hematoma requiring debridement, CKD stage III, anemia, hypertension, and chronic lower extremity edema admitted with mechanical fall leading to right-sided intertrochanteric femur fracture with admission complicated by worsening anemia.  Assessment & Plan    Acute on chronic combined systolic and diastolic CHF/NICM  -Chest x-ray continues to show significant volume overload.  The patient is undergoing fluid removal via CRRT. - Continue to hold losartan and BB. Not on ARNI/MRA 2/2 CKD/hypotension. Medical therapy has been limited by hypotension and blood loss anemia with worsening renal function.   -Overall prognosis is very poor.  Blood loss anemia -Hemoglobin was 6.9 but improved to 8 with transfusion  Valvular heart disease s/p mechanical mitral valve - Coumadin held as of admission (6/27).  Continue heparin drip for now.  Paroxysmal atrial  fibrillation - Continue amiodarone. AV paced. BB held d/t hypotension. Anticoagulation on hold as above d/t blood loss anemia as previously noted.   HTN - Hypotension limiting cardiac therapy this admission. Remains on norepinephrine drip. Continue to monitor vitals.   ORIF of R intertrochanteric femur : Preop cardiovascular evaluation Given the patient's very low EF, pulmonary edema, renal failure, extreme frail status and age, she is at very high risk for cardiovascular complications and poor recovery after surgery. I personally do not think the patient will gain much by undergoing surgery given that even with successful surgery, I doubt that she will be able to gain reasonable functional capacity as she was extremely deconditioned even before her hip fracture and she has multiple major medical conditions.  Palliative Care -Palliative care approach focusing on comfort is recommended.  Extensive discussion with patient and daughter.    For questions or updates, please contact [place] Please consult [email or web address] for contact info under        Signed, Kathlyn Sacramento, MD  01/04/2019, 2:20 PM

## 2019-01-04 NOTE — Progress Notes (Signed)
[name]                                                                                                                                                                              CRITICAL CARE NOTE        SUBJECTIVE FINDINGS & SIGNIFICANT EVENTS   -84 year old female with a history of severe systolic CHF, mechanical mitral valve, atrial fibrillation on Eliquis chronic hypoxemia on 4L at home admitted to MICU for worsening hypoxemia post right hip fracture.  Confused but NAD this morning.  Reports pain but does not wish to receive opiate analgesics.  No new complaints.  She is now voicing that she wishes to undergo hip surgery.   PAST MEDICAL HISTORY   Past Medical History:  Diagnosis Date  . Arthritis    "back, hands, knees" (04/10/2016)  . Chronic back pain   . 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 EF 25% by echo in 08/2015 b. RHC in 08/2015 showed normal filling pressures; c. 10/2018 Echo (in setting of atrial tachycardia): EF 25-30%, impaired relaxation. Glob HK. RVSP 75mmHg. Mod dil RA/LA. Mech MV w/ nl fxn (mean grad 77mmHg). Mod TR.   [name] Chronic kidney disease   . Compression fracture    "several; all in my back" (04/10/2016)  . GERD (gastroesophageal reflux disease)   . Hypertension   . Lymphedema   . Pacemaker 05/2017  . PAF (paroxysmal atrial fibrillation) ([place])    a. On amio & Coumadin - CHA2DS2VASc = 4.  . S/P MVR (mitral valve replacement)    a. MVR 1994 b. redo MVR in 08/2014 - on Coumadin  . Shortness of breath dyspnea      SURGICAL HISTORY   Past Surgical History:  Procedure Laterality Date  . BREAST LUMPECTOMY Right 2005?   benign lump excision  . CARDIAC CATHETERIZATION    . [place], 2016   "MVR; MVR"  . CARDIOVERSION N/A 11/06/2018   Procedure: CARDIOVERSION;  Surgeon: Minna Merritts, MD;  Location: [facility] ORS;  Service: Cardiovascular;  Laterality: N/A;  . CATARACT EXTRACTION  W/PHACO Right 02/20/2015   Procedure: CATARACT EXTRACTION PHACO AND INTRAOCULAR LENS PLACEMENT ([place]);  Surgeon: Estill Cotta, MD;  Location: [facility] ORS;  Service: Ophthalmology;  Laterality: Right;  US01:31.2 AP 25.3% CDE40.13 Fluid lot # [id number]  . DRESSING CHANGE UNDER ANESTHESIA Left 11/17/2018   Procedure: DRESSING CHANGE;  Surgeon: Robert Bellow, MD;  Location: [facility] ORS;  Service: General;  Laterality: Left;  no anesthesia  . ELECTROPHYSIOLOGIC STUDY N/A 10/09/2015   Procedure: CARDIOVERSION;  Surgeon: Wellington Hampshire, MD;  Location: [facility] ORS;  Service: Cardiovascular;  Laterality: N/A;  .  INCISION AND DRAINAGE OF WOUND Left 11/13/2018   Procedure: LEFT LEG DRESSING CHANGE;  Surgeon: Robert Bellow, MD;  Location: [facility] ORS;  Service: General;  Laterality: Left;  . INCISION AND DRAINAGE OF WOUND Left 11/19/2018   Procedure: LEFT LEG DRESSING CHANGE AND GRAFT APPLICATION;  Surgeon: Robert Bellow, MD;  Location: [facility] OR[facility];  Service: General;  Laterality: Left;  . IR GENERIC HISTORICAL  04/01/2016   IR RADIOLOGIST EVAL & MGMT 04/01/2016 [facility]  . IR GENERIC HISTORICAL  04/15/2016   IR VERTEBROPLASTY CERV/THOR BX INC UNI/BIL INC/INJECT/IMAGING 04/15/2016 Luanne Bras, MD [facility]  . IR GENERIC HISTORICAL  04/15/2016   IR VERTEBROPLASTY EA ADDL (T&LS) BX INC UNI/BIL INC INJECT/IMAGING 04/15/2016 Luanne Bras, MD [facility]  . IR GENERIC HISTORICAL  04/15/2016   IR VERTEBROPLASTY EA ADDL (T&LS) BX INC UNI/BIL INC INJECT/IMAGING 04/15/2016 Luanne Bras, MD [facility]  . IR GENERIC HISTORICAL  05/20/2016   IR RADIOLOGIST EVAL & MGMT 05/20/2016 [facility]  . IR GENERIC HISTORICAL  06/13/2016   IR VERTEBROPLASTY EA ADDL (T&LS) BX INC UNI/BIL INC INJECT/IMAGING 06/13/2016 Luanne Bras, MD [facility]  . IR GENERIC HISTORICAL  06/13/2016   IR SACROPLASTY BILATERAL 06/13/2016 Luanne Bras, MD [facility]  . IR GENERIC HISTORICAL   06/13/2016   IR VERTEBROPLASTY LUMBAR BX INC UNI/BIL INC/INJECT/IMAGING 06/13/2016 Luanne Bras, MD [facility]  . IRRIGATION AND DEBRIDEMENT HEMATOMA Left 07/05/2015   Procedure: IRRIGATION AND DEBRIDEMENT HEMATOMA;  Surgeon: Robert Bellow, MD;  Location: [facility];  Service: General;  Laterality: Left;  . IRRIGATION AND DEBRIDEMENT HEMATOMA Left 11/11/2018   Procedure: IRRIGATION AND DEBRIDEMENT LEFT LEG HEMATOMA;  Surgeon: Robert Bellow, MD;  Location: [facility];  Service: General;  Laterality: Left;  . pyloric stenosis  07/1937  . US ECHOCARDIOGRAPHY       FAMILY HISTORY   Family History  Problem Relation Age of Onset  . Stroke Mother   . Renal Disease Father      SOCIAL HISTORY   Social History   Tobacco Use  . Smoking status: Former Smoker    Packs/day: 1.00    Years: 27.00    Pack years: 27.00    Types: Cigarettes    Quit date: 07/01/1980    Years since quitting: 38.5  . Smokeless tobacco: Never Used  . Tobacco comment: "quit smoking ~ 1980  Substance Use Topics  . Alcohol use: Not Currently    Alcohol/week: 0.0 standard drinks    Comment: 04/10/2016 "I'll have a drink on holidays/special occasions"  . Drug use: No     MEDICATIONS   Current Medication:  Current Facility-Administered Medications:  .  acetaminophen (OFIRMEV) IV 1,000 mg, 1,000 mg, Intravenous, Q6H PRN, Wilhelmina Mcardle, MD, Stopped at 01/04/19 1328 .  acetaminophen (TYLENOL) tablet 650 mg, 650 mg, Oral, Q6H PRN, 650 mg at 01/04/19 0512 **OR** acetaminophen (TYLENOL) suppository 650 mg, 650 mg, Rectal, Q6H PRN, Lance Coon, MD .  amiodarone (PACERONE) tablet 200 mg, 200 mg, Oral, Daily, Ojie, Jude, MD, 200 mg at 01/04/19 0907 .  ascorbic acid 500 mg in sodium chloride 0.9% 50 ml, 500 mg, Intravenous, Daily, Lanney Gins, Fuad, MD, Last Rate: 0 mL/hr at 12/31/18 2041, 500 mg at 01/04/19 0907 .  Chlorhexidine Gluconate Cloth 2 % PADS 6 each, 6 each, Topical, Daily, Ottie Glazier, MD, 6  each at 01/03/19 [phone] .  cholecalciferol (VITAMIN D3) tablet 2,000 Units, 2,000 Units, Oral, Daily, Ojie, Jude, MD, 2,000 Units at 01/01/19 1014 .  heparin ADULT infusion 100 units/mL (25000 units/258mL sodium chloride 0.45%), 1,450 Units/hr, Intravenous, Continuous, Ojie, Jude, MD, Last Rate: 14.5 mL/hr at 01/04/19 1400, 1,450 Units/hr at 01/04/19 1400 .  heparin injection 1,000-6,000 Units, 1,000-6,000 Units, CRRT, PRN, Holley Raring, [place], MD, 1,400 Units at 01/02/19 1606 .  ipratropium-albuterol (DUONEB) 0.5-2.5 (3) MG/3ML nebulizer solution 3 mL, 3 mL, Nebulization, Q6H PRN, Wilhelmina Mcardle, MD .  Melatonin TABS 5 mg, 5 mg, Oral, QHS PRN, Wilhelmina Mcardle, MD .  meropenem (MERREM) 1 g in sodium chloride 0.9 % 100 mL IVPB, 1 g, Intravenous, Q12H, Orene Desanctis, [facility], Last Rate: 200 mL/hr at 01/04/19 0953, 1 g at 01/04/19 0953 .  multivitamin-lutein (OCUVITE-LUTEIN) capsule 1 capsule, 1 capsule, Oral, Daily, Ojie, Jude, MD, 1 capsule at 01/01/19 1015 .  [DISCONTINUED] ondansetron (ZOFRAN) tablet 4 mg, 4 mg, Oral, Q6H PRN **OR** ondansetron (ZOFRAN) injection 4 mg, 4 mg, Intravenous, Q6H PRN, Lance Coon, MD .  traMADol Veatrice Bourbon) tablet 50 mg, 50 mg, Oral, Q6H PRN, Wilhelmina Mcardle, MD    ALLERGIES   Ace inhibitors, Doxycycline, Hydrocodone, Mercury, Silver, Cephalexin, Clindamycin/lincomycin, Nickel, and Penicillins    PHYSICAL EXAMINATION   Vitals:   01/04/19 1400 01/04/19 1500  BP: (!) 112/46 (!) 105/44  Pulse: 66 62  Resp: 18 (!) 23  Temp:    SpO2: 97% 97%    GENERAL: Cachectic, chronically ill-appearing, no overt respiratory distress HEAD: Temporal wasting, NCAT EYES: Pupils equal, round, reactive to light.  No scleral icterus.  MOUTH: Moist mucosal membrane. NECK: Supple. No thyromegaly. No nodules. No JVD.  PULMONARY: Dependent crackles CARDIOVASCULAR: Regular, mechanical S1, + systolic M GASTROINTESTINAL: Soft, NT, NABS MUSCULOSKELETAL: RLE rotated outward.  Bilateral  ecchymoses.  B LE edema NEUROLOGIC: Confused, no focal deficits SKIN:intact,warm,dry   LABS AND IMAGING      LAB RESULTS: Recent Labs  Lab 01/03/19 0220 01/03/19 0504 01/03/19 0919  NA 139 139 138  K 4.1 3.9 4.0  CL 106 107 105  CO2 25 25 23   BUN 29* 29* 28*  CREATININE 1.42* 1.29* 1.27*  GLUCOSE 83 82 87   Recent Labs  Lab 01/02/19 0506 01/03/19 0504 01/04/19 0450  HGB 8.0* 6.9* 8.0*  HCT 27.2* 23.1* 27.9*  WBC 18.5* 11.1* 9.7  PLT 151 123* 150     CXR 07/05: [place]    ASSESSMENT AND PLAN    -Multidisciplinary rounds held today  Acute on chronic hypoxemic respiratory failure due to pulmonary edema and bilateral atelectasis -Continue supplemental oxygen to maintain SPO2 >90% -Change nebulized bronchodilators to as needed only  Chronic HFrEF with EF 09-62% -Complicated by AF and mech MV requiring anticoagulation -Dr. Tyrell Antonio input appreciated -Continue heparin infusion per pharmacy   Acute blood loss anemia   - due to R hip fracture and hematoma formation    - s/p 3 units prbc transfusion    -Monitor H/H daily.  Transfuse per usual guidelines      Rt hip fracture   -I am awaiting contact with Dr. Marry Guan to discuss whether she could now undergo ORIF at this facility.  I suspect not.  Awaiting transfer to [facility] -Continue analgesia as needed  Acute on chronic renal insufficiency Nephrology following No dialysis planned for today Monitor BMET intermittently Monitor I/Os Correct electrolytes as indicated   Severe protein calorie malnutrition and deconditioning -Advance nutrition as able    I had an extended discussion with the patient's son and daughter.  They expressed great frustration with the care that she  has received, mostly pertaining to her prior hospitalization.  They are extremely frustrated that she was not considered a candidate for repair of hip fracture.  I tried to explain the rationale for that decision.  I encouraged that they  consider a more palliative/comfort care approach.  Patient's son, Helene Kelp, seems especially intent on her getting hip surgery even recognizing that it would be extremely high risk and that her likelihood of recovering to a functional state would be extremely low. It is therefore frustrating that they canceled the transfer to [facility] last night.  Patient is presently on the waiting list for transfer to [facility].  I will try to speak with Dr. Marry Guan today  Merton Border, MD PCCM service Mobile [phone] Pager [phone] 01/04/2019 3:38 PM

## 2019-01-04 NOTE — Progress Notes (Signed)
ANTICOAGULATION CONSULT NOTE  Pharmacy Consult for Heparin Drip Indication: mechanical Mitral Valve/A.fib  Patient Measurements: Height: 4\' 11"  (149.9 cm) Weight: 143 lb 15.4 oz (65.3 kg) IBW/kg (Calculated) : 43.2 Heparin Dosing Weight: 57.9kg  Vital Signs: Temp: 99 F (37.2 C) (07/06 0200) Temp Source: Axillary (07/06 0200) BP: 120/49 (07/06 0400) Pulse Rate: 65 (07/06 0400)  Labs: Recent Labs    01/02/19 0506  01/03/19 0220 01/03/19 0504 01/03/19 0919 01/03/19 1050 01/03/19 1853 01/04/19 0450  HGB 8.0*  --   --  6.9*  --   --   --   --   HCT 27.2*  --   --  23.1*  --   --   --   --   PLT 151  --   --  123*  --   --   --   --   HEPARINUNFRC 0.19*   < >  --   --   --  0.33 0.47 0.40  CREATININE 1.24*   < > 1.42* 1.29* 1.27*  --   --   --    < > = values in this interval not displayed.    Estimated Creatinine Clearance: 28.5 mL/min (A) (by C-G formula based on SCr of 1.27 mg/dL (H)).   Medical History: Past Medical History:  Diagnosis Date  . Arthritis    "back, hands, knees" (04/10/2016)  . Chronic back pain   . 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 EF 25% by echo in 08/2015 b. RHC in 08/2015 showed normal filling pressures; c. 10/2018 Echo (in setting of atrial tachycardia): EF 25-30%, impaired relaxation. Glob HK. RVSP 67mmHg. Mod dil RA/LA. Mech MV w/ nl fxn (mean grad 12mmHg). Mod TR.   Marland Kitchen Chronic kidney disease   . Compression fracture    "several; all in my back" (04/10/2016)  . GERD (gastroesophageal reflux disease)   . Hypertension   . Lymphedema   . Pacemaker 05/2017  . PAF (paroxysmal atrial fibrillation) ([place])    a. On amio & Coumadin - CHA2DS2VASc = 4.  . S/P MVR (mitral valve replacement)    a. MVR 1994 b. redo MVR in 08/2014 - on Coumadin  . Shortness of breath dyspnea     Assessment: Closed right hip fracture secondary to mechanical fall  Coagulopathy was reversed with vitamin K and INR  down to 1.2 on 6/28.-Patient on Coumadin prior to admission Paroxysmal atrial fibrillation and history of mechanical mitral valve; AV paced Rate controlled. Given history of mechanical mitral valve, patient was previously started on Heparin on 6/28 but was stopped on 6/29 due to significant drop in hemoglobin(7.8>6.1). Patient has since received PRBC transfusion and Hgb is currently 6.9. Per Dr. Saunders Revel, it is safe to restart heparin drip and has placed the consult for Pharmacy to re-initiate  Goal of Therapy:  Heparin level 0.3-0.7 units/ml Monitor platelets  Plan: 07/06 @ 0500 HL 0.40 therapeutic. Will continue current rate 1450 units/hr and will recheck anti-Xa w/ am labs. Patient's hgb dropped to 6.9 and is s/p 1 unit pRBC, will continue to monitor.  Tobie Lords, PharmD, BCPS Clinical Pharmacist 01/04/2019 5:34 AM

## 2019-01-04 NOTE — Progress Notes (Signed)
Nutrition Follow-up  DOCUMENTATION CODES:   Not applicable  INTERVENTION:  Discontinued Ensure Enlive as patient is now on nectar-thick liquids.  Provide Hormel Shake (Vital Cuisine) po BID with lunch and dinner trays, each supplement provides 520 kcal and 22 grams of protein.  NUTRITION DIAGNOSIS:   Increased nutrient needs related to wound healing as evidenced by estimated needs.  Ongoing.  GOAL:   Patient will meet greater than or equal to 90% of their needs  Progressing.  MONITOR:   PO intake, Supplement acceptance, Labs, Weight trends, Skin, I & O's  REASON FOR ASSESSMENT:   Consult Assessment of nutrition requirement/status, Wound healing  ASSESSMENT:   84 year old female with PMHx of HTN, chronic combined systolic and diastolic heart failure (LV EF 25-30% 11/05/2018), GERD, hx mitral valve replacement with mechanical valve in 2016 on Coumadin, arthritis, chronic back pain, CKD IV, lymphedema, A-fib s/p pacemaker placement in 2018,  Left lower extremity wound/hematoma s/p debridement and ACell placement admitted with closed right hip fracture secondary to mechanical fall.  Patient more alert now. She was put on dysphagia 1 diet with nectar-thick liquids by SLP on 7/4. Patient's CRRT was stopped yesterday. Plan is for intermittent HD later today. Patient's daughter and son have been coming during the day and are feeding patient. She does not like the pureed diet and is taking in small amounts. She does like the [employer] coming on trays since she is on nectar-thick. Family is still deciding on goals of care.  Medications reviewed and include: amiodarone, vitamin D3 2000 units daily, Ocuvite daily, ascorbic acid 500 mg daily IV, heparin gtt, meropenem.  Labs reviewed.  Diet Order:   Diet Order            DIET - DYS 1 Room service appropriate? Yes with Assist; Fluid consistency: Nectar Thick  Diet effective now             EDUCATION NEEDS:   No education needs  have been identified at this time  Skin:  Skin Assessment: Skin Integrity Issues:(wound left leg; unstageable pressure injury left ankle)  Last BM:  01/04/2019 - large type 5  Height:   Ht Readings from Last 1 Encounters:  12/25/18 4\' 11"  (1.499 m)   Weight:   Wt Readings from Last 1 Encounters:  01/04/19 65.3 kg   Ideal Body Weight:  44.7 kg  BMI:  Body mass index is 29.08 kg/m.  Estimated Nutritional Needs:   Kcal:  1600-1800  Protein:  80-90 grams  Fluid:  per MD  Willey Blade, MS, RD, LDN Office: [phone] Pager: [phone] After Hours/Weekend Pager: [phone]

## 2019-01-04 NOTE — Progress Notes (Signed)
[facility]  ROUNDING NOTE   Subjective:   Son at bedside. Patient is alert times 3 but poor insight.   Laying in bed. No complaints.  Objective:  Vital signs in last 24 hours:  Temp:  [97.8 F (36.6 C)-99 F (37.2 C)] 97.9 F (36.6 C) (07/06 0800) Pulse Rate:  [59-87] 83 (07/06 1000) Resp:  [17-26] 26 (07/06 1000) BP: (61-124)/(42-69) 121/50 (07/06 1000) SpO2:  [92 %-100 %] 96 % (07/06 1000) Weight:  [65.3 kg] 65.3 kg (07/06 0458)  Weight change: -0.4 kg Filed Weights   01/02/19 0500 01/03/19 0500 01/04/19 0458  Weight: 62.5 kg 65.7 kg 65.3 kg    Intake/Output: I/O last 3 completed shifts: In: 1258.1 [I.V.:503.1; Blood:326; IV Piggyback:429] Out: 6384 [Other:1043]   Intake/Output this shift:  Total I/O In: 93.1 [P.O.:50; I.V.:43.1] Out: 50 [Urine:50]  Physical Exam: General: Crtiically ill   Head: Normocephalic, atraumatic. Moist oral mucosal membranes  Eyes: Anicteric, PERRL  Neck: Supple, trachea midline  Lungs:  Bilateral crackles at bases  Heart: Regular rate and rhythm, paced  Abdomen:  Soft, nontender  Extremities: ++ peripheral edema.  Neurologic: Nonfocal, moving all four extremities  Skin: No lesions  Access: Left femoral temp HD catheter 6/30 Dr. Lanney Gins    Basic Metabolic Panel: Recent Labs  Lab 01/02/19 1227 01/02/19 2110 01/03/19 0220 01/03/19 0504 01/03/19 0919  NA 137 140 139 139 138  K 4.2 4.0 4.1 3.9 4.0  CL 102 106 106 107 105  CO2 25 24 25 25 23   GLUCOSE 95 86 83 82 87  BUN 25* 29* 29* 29* 28*  CREATININE 1.32* 1.43* 1.42* 1.29* 1.27*  CALCIUM 8.9 8.8* 8.7* 8.7* 9.0  MG 2.0 1.9 2.0 1.9 1.9  PHOS 2.3* 2.5 2.4* 2.4* 2.3*    Liver Function Tests: Recent Labs  Lab 01/02/19 1227 01/02/19 2110 01/03/19 0220 01/03/19 0504 01/03/19 0919  ALBUMIN 3.2* 3.3* 3.2* 3.1* 3.4*   No results for input(s): LIPASE, AMYLASE in the last 168 hours. No results for input(s): AMMONIA in the last 168 hours.  CBC: Recent  Labs  Lab 12/31/18 0446 01/01/19 0452 01/02/19 0506 01/03/19 0504 01/04/19 0450  WBC 13.9* 11.5* 18.5* 11.1* 9.7  NEUTROABS  --  9.9* 16.3*  --   --   HGB 7.8* 7.8* 8.0* 6.9* 8.0*  HCT 25.5* 26.3* 27.2* 23.1* 27.9*  MCV 87.3 90.4 89.5 89.9 93.3  PLT 142* 131* 151 123* 150    Cardiac Enzymes: No results for input(s): CKTOTAL, CKMB, CKMBINDEX, TROPONINI in the last 168 hours.  BNP: Invalid input(s): POCBNP  CBG: Recent Labs  Lab 12/31/18 [place]*    Microbiology: Results for orders placed or performed during the hospital encounter of 12/25/18  SARS Coronavirus 2 (CEPHEID- Performed in [place] hospital lab), Hosp Order     Status: None   Collection Time: 12/26/18 12:33 AM   Specimen: Nasopharyngeal Swab  Result Value Ref Range Status   SARS Coronavirus 2 NEGATIVE NEGATIVE Final    Comment: (NOTE) If result is NEGATIVE SARS-CoV-2 target nucleic acids are NOT DETECTED. The SARS-CoV-2 RNA is generally detectable in upper and lower  respiratory specimens during the acute phase of infection. The lowest  concentration of SARS-CoV-2 viral copies this assay can detect is 250  copies / mL. A negative result does not preclude SARS-CoV-2 infection  and should not be used as the sole basis for treatment or other  patient management decisions.  A negative result may occur with  improper specimen collection / handling, submission of specimen other  than nasopharyngeal swab, presence of viral mutation(s) within the  areas targeted by this assay, and inadequate number of viral copies  (<250 copies / mL). A negative result must be combined with clinical  observations, patient history, and epidemiological information. If result is POSITIVE SARS-CoV-2 target nucleic acids are DETECTED. The SARS-CoV-2 RNA is generally detectable in upper and lower  respiratory specimens dur ing the acute phase of infection.  Positive  results are indicative of active infection with  SARS-CoV-2.  Clinical  correlation with patient history and other diagnostic information is  necessary to determine patient infection status.  Positive results do  not rule out bacterial infection or co-infection with other viruses. If result is PRESUMPTIVE POSTIVE SARS-CoV-2 nucleic acids MAY BE PRESENT.   A presumptive positive result was obtained on the submitted specimen  and confirmed on repeat testing.  While 2019 novel coronavirus  (SARS-CoV-2) nucleic acids may be present in the submitted sample  additional confirmatory testing may be necessary for epidemiological  and / or clinical management purposes  to differentiate between  SARS-CoV-2 and other Sarbecovirus currently known to infect humans.  If clinically indicated additional testing with an alternate test  methodology [phone]) is advised. The SARS-CoV-2 RNA is generally  detectable in upper and lower respiratory sp ecimens during the acute  phase of infection. The expected result is Negative. Fact Sheet for Patients:  StrictlyIdeas.no Fact Sheet for Healthcare Providers: BankingDealers.co.za This test is not yet approved or cleared by the [place] FDA and has been authorized for detection and/or diagnosis of SARS-CoV-2 by FDA under an Emergency Use Authorization (EUA).  This EUA will remain in effect (meaning this test can be used) for the duration of the COVID-19 declaration under Section 564(b)(1) of the Act, 21 U.S.C. section 360bbb-3(b)(1), unless the authorization is terminated or revoked sooner. Performed at [facility], [address] [place], [place] [address]   MRSA PCR Screening     Status: None   Collection Time: 12/26/18  1:54 AM   Specimen: Nasal Mucosa; Nasopharyngeal  Result Value Ref Range Status   MRSA by PCR NEGATIVE NEGATIVE Final    Comment:        The GeneXpert MRSA Assay (FDA approved for NASAL specimens only), is one component  of a comprehensive MRSA colonization surveillance program. It is not intended to diagnose MRSA infection nor to guide or monitor treatment for MRSA infections. Performed at [facility], [address] [place], [place] [address]   CULTURE, BLOOD (ROUTINE X 2) w Reflex to ID Panel     Status: None   Collection Time: 12/28/18  3:09 PM   Specimen: BLOOD  Result Value Ref Range Status   Specimen Description BLOOD RIGHT ANTECUBITAL  Final   Special Requests   Final    BOTTLES DRAWN AEROBIC AND ANAEROBIC Blood Culture adequate volume   Culture   Final    NO GROWTH 5 DAYS Performed at [facility], [address] [place], [place] [address]    Report Status 01/02/2019 FINAL  Final  CULTURE, BLOOD (ROUTINE X 2) w Reflex to ID Panel     Status: None   Collection Time: 12/28/18  3:19 PM   Specimen: BLOOD  Result Value Ref Range Status   Specimen Description BLOOD BLOOD RIGHT HAND  Final   Special Requests   Final    BOTTLES DRAWN AEROBIC AND ANAEROBIC Blood Culture adequate volume  Culture   Final    NO GROWTH 5 DAYS Performed at [facility], [address] [place], [place] [address]    Report Status 01/02/2019 FINAL  Final  CULTURE, BLOOD (ROUTINE X 2) w Reflex to ID Panel     Status: None (Preliminary result)   Collection Time: 01/02/19  9:49 AM   Specimen: BLOOD  Result Value Ref Range Status   Specimen Description BLOOD RIGHT ANTECUBITAL  Final   Special Requests   Final    BOTTLES DRAWN AEROBIC AND ANAEROBIC Blood Culture results may not be optimal due to an excessive volume of blood received in culture bottles   Culture   Final    NO GROWTH 2 DAYS Performed at [facility], [address] [place], [place] [address]    Report Status PENDING  Incomplete  CULTURE, BLOOD (ROUTINE X 2) w Reflex to ID Panel     Status: None (Preliminary result)   Collection Time: 01/02/19 10:00 AM   Specimen: BLOOD  Result Value Ref Range Status    Specimen Description BLOOD BLOOD RIGHT HAND  Final   Special Requests   Final    BOTTLES DRAWN AEROBIC AND ANAEROBIC Blood Culture results may not be optimal due to an excessive volume of blood received in culture bottles   Culture   Final    NO GROWTH 2 DAYS Performed at [facility], [address], [place], [place] [address]    Report Status PENDING  Incomplete  Culture, respiratory     Status: None (Preliminary result)   Collection Time: 01/02/19  3:08 PM   Specimen: INDUCED SPUTUM  Result Value Ref Range Status   Specimen Description   Final    INDUCED SPUTUM Performed at [facility], [address] [place], [place] [address]    Special Requests   Final    NONE Performed at [facility], [address] [place], [place] [address]    Gram Stain   Final    RARE WBC PRESENT, PREDOMINANTLY PMN FEW GRAM POSITIVE COCCI IN PAIRS FEW YEAST RARE GRAM POSITIVE RODS    Culture   Final    CULTURE REINCUBATED FOR BETTER GROWTH Performed at [facility], [place] [address] [place], [place] [address]    Report Status PENDING  Incomplete    Coagulation Studies: No results for input(s): LABPROT, INR in the last 72 hours.  Urinalysis: No results for input(s): COLORURINE, LABSPEC, PHURINE, GLUCOSEU, HGBUR, BILIRUBINUR, KETONESUR, PROTEINUR, UROBILINOGEN, NITRITE, LEUKOCYTESUR in the last 72 hours.  Invalid input(s): APPERANCEUR    Imaging: Dg Chest Port 1 View  Result Date: 01/03/2019 CLINICAL DATA:  Acute respiratory failure. EXAM: PORTABLE CHEST 1 VIEW COMPARISON:  01/02/2019 FINDINGS: Artifact overlies the chest. Persistent bilateral patchy pulmonary infiltrates consistent with bronchopneumonia. Slight and slow radiographic improvement over the last several films. Small amount of pleural fluid on the left. No qualitatively new finding. IMPRESSION: Bilateral pneumonia, slowly improving by radiography. Electronically Signed   By: Nelson Chimes M.D.    On: 01/03/2019 06:55     Medications:   . ascorbic acid 500 mg in sodium chloride 0.9% 50 ml 0 mg (12/31/18 2041)  . azithromycin 500 mg (01/03/19 1013)  . heparin 1,450 Units/hr (01/04/19 1000)  . meropenem (MERREM) IV 1 g (01/04/19 0953)  . norepinephrine (LEVOPHED) Adult infusion Stopped (12/31/18 1543)   . sodium chloride   Intravenous Once  . sodium chloride   Intravenous Once  . sodium chloride  Intravenous Once  . amiodarone  200 mg Oral Daily  . Chlorhexidine Gluconate Cloth  6 each Topical Daily  . cholecalciferol  2,000 Units Oral Daily  . digoxin  0.125 mg Intravenous Daily  . ipratropium-albuterol  3 mL Nebulization TID  . Melatonin  5 mg Oral QHS  . midodrine  5 mg Oral TID WC  . multivitamin-lutein  1 capsule Oral Daily  . polyethylene glycol  17 g Oral Daily  . thiamine injection  100 mg Intravenous Daily   acetaminophen **OR** acetaminophen, albuterol, heparin, ondansetron **OR** ondansetron (ZOFRAN) IV, traMADol, traZODone  Assessment/ Plan:  [name] is a 84 y.o. white female with congestive heart failure EF 25%, hypertension, atrial fibrillation, lymphedema, mitral valve replacement (mechanical valve) requiring chronic anticoagulation, COPD, osteoporosis, history of multiple lumbar compression fractures requiring kyphoplasty, history of left leg hematoma with evacuation and porcine allograft, was admitted on 12/25/2018 with right hip fracture.   1.  Acute kidney injury, chronic kidney disease stage III.  Baseline 1.37/GFR 36 from December 15, 2018 Requiring renal replacement therapy. Started on CRRT from 6/30 to 7/5. Scheduled for intermittent hemodialysis today.  Acute renal failure secondary to acute cardiorenal syndrome and overdiuresis.  Plan for intermittent hemodialysis later today. Orders prepared.   2. Right hip fracture: Appreciate ortho and anesthesia input. Currently not an ideal operative candidate.   3.  Chronic systolic congestive  heart failure with EF 25%  4.  Pulmonary hypertension: requiring oxygen.   5.  Mechanical heart valve requiring chronic anticoagulation. History of endocarditis    LOS: 10 [name] 7/6/202010:23 AM

## 2019-01-04 NOTE — Progress Notes (Signed)
Patient's son spoke with nurse and does not want patient to have dialysis, thinks patient will do better if she has Lasix instead. This RN told the son that it will be passed on in the AM for the nephrologist to speak with the son about his wishes and plan of care for the patient. Reported to dialysis nurse that the family did not sign consent and said they will readdress in the AM.

## 2019-01-04 NOTE — Progress Notes (Signed)
[place] called for patient's update and report. Does not have a bed yet, but patient is still on the transfer list.

## 2019-01-04 NOTE — Progress Notes (Addendum)
SLP Cancellation Note  Patient Details Name: GERALINE HALBERSTADT MRN: [id number] DOB: 1935/06/07   Cancelled treatment:       Reason Eval/Treat Not Completed: Patient not medically ready(chart reviewed). Pt continues to present w/ waxing and waning alertness as per MD notes("closes her eyes frequently" during MD visit); she appears "very deconditioned". She is taking bites and sips of the dysphagia diet - this consistency was ordered for precaution d/t pt's increased risk for dysphagia and aspiration secondary to her declined medical and alert status. Per MD note, patient has low EF, pulmonary edema, renal failure, extreme frail status and deconditioning. Noted per recent CXR, the Bilateral pneumonia is slowly improving. NSG has reported low O2 sats when attempting oral intake - over exertion? Per Lab reports, pt's WBC has improved since initiation of dysphagia diet. Recommend continue w/ current diet d/t increased risk for dysphagia and aspiration thus declined Pulmonary status. ST services will continue to follow and monitor pt's medical and pulmonary status' for appropriateness for trials to upgrade diet consistency. Dietician consulted.       Orinda Kenner, MS, CCC-SLP Watson,Katherine 01/04/2019, 2:58 PM

## 2019-01-04 NOTE — Progress Notes (Signed)
   01/04/19 1400  Clinical Encounter Type  Visited With Patient  Visit Type Follow-up;Psychological support  Ch visited the pt to check on how she was doing. It took a little while for the pt to recognize the ch. Upon asking how she was doing pt said she is feeling terrible and trying to decide what to do with surgery. Pt was looking drowsy so ch, instead of inquiring more, prayed with her for peace and comfort and left so she can get some rest. Pt may benefit from having a conversation about what her wishes are in terms of her medical care and learn about the benefits and risks involved in a surgery.

## 2019-01-04 NOTE — Progress Notes (Signed)
Ch met with pt and family post- progressive rounds. Pt was alert and presented to be lucid while son was giving pt ice chips. Pt c/o pain related to her broken hip. Sitter was present w/ pt at the time. [place] spent time with the son and daughter to discuss the best GOC for the pt and to advocate on her behalf regarding what would be best for her moving forward. Ch spent time one-on-one with pt's son. Son expressed his concerned regarding the quality of care that the pt has received during her hospitalizations since Nov 08 2018. Ch provided a compassionate presence and also shared her concerns about what would be in the best interest for the pt at this point. Son still wanted to try to fulfill the pt's wish for hip surgery. Ch was concerned about the aftercare of the pt while recovering from surgery such as the pt going to rehab post-op; getting dialysis; would the pt need a permanent port for HD, and what safety measures need to be put in place for the pt to get home if she were to have surgery. Ch was present while intensivist consulted with family who expressed his concerns also regarding the pt's quality of life if she were to proceed with the surgery/aftercare/dialysis as a permanent means of life prolonging measure(s) that the pt needs in order to live. Family and pt are challenged w/ getting to a place of acceptance with the pt's current poor prognosis and limited options. Ch escorted pt family to pt's room and shared that f/u will be provided at a later time.   Goal: F/U with care team to determine the pt's GOC/ determine if pt has been seen by palliative care/ support family members that are expressing anticipatory grief of their loved one.    01/04/19 1300  Clinical Encounter Type  Visited With Patient and family together;Health care provider  Visit Type Psychological support;Spiritual support;Social support;Critical Care  Referral From Physician  Consult/Referral To Chaplain  Spiritual Encounters   Spiritual Needs Emotional;Grief support  Stress Factors  Patient Stress Factors Health changes;Exhausted;Family relationships;Lack of knowledge;Loss of control;Major life changes  Family Stress Factors Family relationships;Loss of control;Major life changes

## 2019-01-05 ENCOUNTER — Ambulatory Visit: Payer: Self-pay | Admitting: General Surgery

## 2019-01-05 ENCOUNTER — Inpatient Hospital Stay: Payer: Medicare Other

## 2019-01-05 LAB — CBC
HCT: 30.6 % — ABNORMAL LOW (ref 36.0–46.0)
Hemoglobin: 8.9 g/dL — ABNORMAL LOW (ref 12.0–15.0)
MCH: 27 pg (ref 26.0–34.0)
MCHC: 29.1 g/dL — ABNORMAL LOW (ref 30.0–36.0)
MCV: 92.7 fL (ref 80.0–100.0)
Platelets: 141 10*3/uL — ABNORMAL LOW (ref 150–400)
RBC: 3.3 MIL/uL — ABNORMAL LOW (ref 3.87–5.11)
RDW: 18.6 % — ABNORMAL HIGH (ref 11.5–15.5)
WBC: 9.2 10*3/uL (ref 4.0–10.5)
nRBC: 0 % (ref 0.0–0.2)

## 2019-01-05 LAB — COMPREHENSIVE METABOLIC PANEL
ALT: 13 U/L (ref 0–44)
AST: 12 U/L — ABNORMAL LOW (ref 15–41)
Albumin: 3.3 g/dL — ABNORMAL LOW (ref 3.5–5.0)
Alkaline Phosphatase: 69 U/L (ref 38–126)
Anion gap: 7 (ref 5–15)
BUN: 52 mg/dL — ABNORMAL HIGH (ref 8–23)
CO2: 26 mmol/L (ref 22–32)
Calcium: 8.7 mg/dL — ABNORMAL LOW (ref 8.9–10.3)
Chloride: 108 mmol/L (ref 98–111)
Creatinine, Ser: 2.32 mg/dL — ABNORMAL HIGH (ref 0.44–1.00)
GFR calc Af Amer: 22 mL/min — ABNORMAL LOW (ref >60)
GFR calc non Af Amer: 19 mL/min — ABNORMAL LOW (ref >60)
Glucose, Bld: 90 mg/dL (ref 70–99)
Potassium: 4.6 mmol/L (ref 3.5–5.1)
Sodium: 141 mmol/L (ref 135–145)
Total Bilirubin: 0.9 mg/dL (ref 0.3–1.2)
Total Protein: 6 g/dL — ABNORMAL LOW (ref 6.5–8.1)

## 2019-01-05 LAB — CULTURE, RESPIRATORY W GRAM STAIN: Culture: NORMAL

## 2019-01-05 LAB — HEPARIN LEVEL (UNFRACTIONATED)
Heparin Unfractionated: 0.87 IU/mL — ABNORMAL HIGH (ref 0.30–0.70)
Heparin Unfractionated: 0.67 IU/mL (ref 0.30–0.70)

## 2019-01-05 MED ORDER — TRAMADOL HCL 50 MG PO TABS: 50.0000 mg | ORAL_TABLET | Freq: Four times a day (QID) | ORAL | Status: AC | PRN

## 2019-01-05 MED ORDER — HEPARIN SODIUM (PORCINE) 1000 UNIT/ML DIALYSIS: 1000.0000 [IU] | INTRAMUSCULAR | Status: AC | PRN

## 2019-01-05 MED ORDER — ACETAMINOPHEN 325 MG PO TABS: 650.0000 mg | ORAL_TABLET | Freq: Four times a day (QID) | ORAL | Status: AC | PRN

## 2019-01-05 MED ORDER — VITAMIN C 500 MG PO TABS: 500.0000 mg | ORAL_TABLET | Freq: Every day | ORAL | Status: DC

## 2019-01-05 MED ORDER — HEPARIN (PORCINE) 25000 UT/250ML-% IV SOLN: 1350.0000 [IU]/h | INTRAVENOUS | Status: AC

## 2019-01-05 MED ORDER — FUROSEMIDE 10 MG/ML IJ SOLN
60.0000 mg | Freq: Once | INTRAMUSCULAR | Status: AC
  Administered 2019-01-05: 60 mg via INTRAVENOUS
  Filled 2019-01-05: qty 6

## 2019-01-05 MED ORDER — ACETAMINOPHEN 10 MG/ML IV SOLN
1000.0000 mg | Freq: Four times a day (QID) | INTRAVENOUS | Status: DC | PRN
  Administered 2019-01-05 (×2): 1000 mg via INTRAVENOUS
  Filled 2019-01-05 (×2): qty 100

## 2019-01-05 MED ORDER — CHLORHEXIDINE GLUCONATE CLOTH 2 % EX PADS: 6.0000 | MEDICATED_PAD | Freq: Every day | CUTANEOUS | Status: AC

## 2019-01-05 MED ORDER — ONDANSETRON HCL 4 MG/2ML IJ SOLN: 4.0000 mg | Freq: Four times a day (QID) | INTRAMUSCULAR | 0 refills | Status: AC | PRN

## 2019-01-05 MED ORDER — OCUVITE-LUTEIN PO CAPS: 1.0000 | ORAL_CAPSULE | Freq: Every day | ORAL | 0 refills | Status: AC

## 2019-01-05 MED ORDER — ACETAMINOPHEN 650 MG RE SUPP: 650.0000 mg | Freq: Four times a day (QID) | RECTAL | 0 refills | Status: AC | PRN

## 2019-01-05 MED ORDER — MELATONIN 5 MG PO TABS: 5.0000 mg | ORAL_TABLET | Freq: Every evening | ORAL | 0 refills | Status: AC | PRN

## 2019-01-05 NOTE — Progress Notes (Signed)
No new complaints.  Remains poorly oriented but calm and appropriate in interactions.  Continues to have mild-moderate R hip pain but refusing opiates.  Requesting IV acetaminophen.  Vitals:   01/05/19 1000 01/05/19 1100 01/05/19 1200 01/05/19 1300  BP: (!) 100/48 (!) 141/55 117/63 (!) 111/53  Pulse: 65  64 62  Resp: (!) 26 20 18  (!) 24  Temp:      TempSrc:      SpO2: (!) 89%  92% 100%  Weight:      Height:      6 LPM  Very frail, NAD HEENT: Temporal wasting, NCAT, sclerae white Neck: Supple, no JVD noted Chest: Clear anteriorly Cardiac: Regular, mechanical S1, + systolic M Abdomen: Soft, NT, NABS Extremities: B LE edema, symmetric.  RLE rotated outward Neuro: No focal deficits  BMP Latest Ref Rng & Units 01/05/2019 01/03/2019 01/03/2019  Glucose 70 - 99 mg/dL 90 87 82  BUN 8 - 23 mg/dL 52(H) 28(H) 29(H)  Creatinine 0.44 - 1.00 mg/dL 2.32(H) 1.27(H) 1.29(H)  BUN/Creat Ratio 12 - 28 - - -  Sodium 135 - 145 mmol/L 141 138 139  Potassium 3.5 - 5.1 mmol/L 4.6 4.0 3.9  Chloride 98 - 111 mmol/L 108 105 107  CO2 22 - 32 mmol/L 26 23 25   Calcium 8.9 - 10.3 mg/dL 8.7(L) 9.0 8.7(L)   CBC Latest Ref Rng & Units 01/05/2019 01/04/2019 01/03/2019  WBC 4.0 - 10.5 K/uL 9.2 9.7 11.1(H)  Hemoglobin 12.0 - 15.0 g/dL 8.9(L) 8.0(L) 6.9(L)  Hematocrit 36.0 - 46.0 % 30.6(L) 27.9(L) 23.1(L)  Platelets 150 - 400 K/uL 141(L) 150 123(L)   CXR: Low volumes with edema and atelectasis  IMPRESSION: Acute/chronic hypoxemic respiratory failure Acute pulmonary edema Bilateral atelectasis Doubt pneumonia Dilated CM (LVEF 25-30%) Mechanical mitral valve present Acute blood loss anemia due to R hip fx, no evidence of ongoing bleeding Severe protein-calorie malnutrition Adult failure to thrive AKI/CKD R displaced hip fracture  PLAN/REC: Continue supplemental oxygen as needed to maintain SPO2 >90% Continue diuresis as permitted by BP and renal function Holding further dialysis for now Furosemide per  Nephrology Monitor BMET intermittently Monitor I/Os Correct electrolytes as indicated Continue nutritional support DVT px: Full dose UFH (due to mechanical heart valve) Monitor CBC intermittently Transfuse per usual guidelines Discontinue meropenem as there is no evidence of active infection Appreciate input from cardiology and orthopedic surgery yesterday  Transfer to [facility] pending. I discussed with receiving MD, Dr Pleas Koch who has agreed to accept pt to SDU there   Merton Border, MD PCCM service Mobile [phone] Pager [phone] 01/05/2019 1:10 PM

## 2019-01-05 NOTE — Plan of Care (Signed)
PMT note:  PMT continues to shadow. Patient and family speaking with CCM and specialists, and is transferring to [facility] for operative management.   No charge note.

## 2019-01-05 NOTE — Progress Notes (Addendum)
Speech Therapy note: reviewed chart notes; labs. WBC remains wnl at 9.2; no fevers. Per NSG, pt is tolerating her current diet of mech soft consistency foods(cut meats, cooked foods) for easier mastication, and the Nectar consistency liquids, w/out overt s/s of aspiration noted by NSG today. However, NSG did report, pt continues to have min difficulty when swallowing Pills - pt refuses Crushed in puree but agrees to work w/ NSG on taking one at a time w/ Nectar liquids.  ST services will be available for any further assessment and/or education re: diet consistency while admitted. Recommend continue w/ Nectar consistency liquids w/ aspiration precautions while medical and Cognitive status' remain so declined in order to reduce risk for aspiration thus Pulmonary decline.  NSG agreed.     Orinda Kenner, [place], CCC-SLP

## 2019-01-05 NOTE — Discharge Summary (Signed)
Physician Discharge Summary  Patient ID: BRIT CARBONELL MRN: [id number] DOB/AGE: [birth year] 84 y.o.  Admit date: 12/25/2018 Discharge date: 01/05/2019  Discharge Diagnoses:  Acute on Chronic Hypoxemic Respiratory Failure Acute on Chronic Combined Systolic & Diastolic CHF Acute Blood Loss Anemia AKI on CKD III Right Hip Fracture                                                Severe Protein Calorie Malnutrition & Deconditioning                    DISCHARGE PLAN BY DIAGNOSIS     1) Acute on Chronic Hypoxemic Respiratory Failure secondary to Pulmonary Edema and Bilateral Atelectasis -Supplemental oxygen as needed to maintain O2 sats > 92% -Follow intermittent CXR & ABG as needed -Prn Bronchodilators  -Continue Diuresis as permitted by BP and renal function -HD as per Nephrology for volume removal  2) Acute on Chronic Combined Systolic & Diastolic CHF (EF 73-41%) Paroxsymal Atrial Fibrillation Valvular Heart Disease s/p Mechanical Mitral Valve -Cardiac monitoring -Maintain MAP > 65 -Continue Diuresis as permitted by BP and renal function -Furosemide per Nephrology -Cardiology following, appreciate input -Continue PO Amiodarone -Continue Heparin infusion as per pharmacy for anticoagulation  3) Acute Blood Loss Anemia secondary to hip fracture -Monitor for S/Sx of bleeding -Trend CBC -Heparin infusion for VTE Prophylaxis/Anticoagulation -Transfuse for Hgb <8  4) AKI on CKD III (Baseline Creatinine 1.37/GFR 36 from December 15, 2018) -Monitor I&O's / urinary output -Follow BMP -Ensure adequate renal perfusion -Avoid nephrotoxic agents as able -Replace electrolytes as indicated -Nephrology following, appreciate input -Required CRRT from 6/30 to 7/5 -Hemodialysis as per Nephrology  5) Right Hip Fracture -Ortho following, appreciate input, will follow recommendations -Transfer to [facility] -Analgesia as needed  6) Severe Protein Calorie Malnutrition & Deconditioning -Advance  nutrition as able                    DISCHARGE SUMMARY   Mrs. Calvillo is a 84 year old female with a past medical history notable for chronic combined systolic and diastolic CHF (EF 25 to 93%), paroxysmal atrial fibrillation, mitral valve replacement on chronic Coumadin therapy, chronic kidney disease stage III, COPD on 3-4 L home O2, who presented to [facility] ED on 12/25/2018 with right hip pain status post fall.  She was found to have intertrochanteric right hip fracture, of which she was admitted to the [place] unit with Orthopedic consultation. Cardiology and Nephrology have been following.  On 6/28 she was transitioned from IV Lasix 40 mg bolus BID to low-dose Lasix infusion by Nephrology, along with the addition of Midodrine due to marginal BP.  Early in the morning of 6/29 she was noted to be hypotensive (BP 79/42) and hemoglobin found to be 6.1.  She was transferred to stepdown unit for further evaluation and treatment of hypovolemic shock in the setting of acute blood loss anemia requiring blood transfusions.   Her hospital course has been complicated by acute on chronic combined systolic and diastolic CHF, AKI on CKD stage III, and acute blood loss anemia. She has received multiple blood transfusions, and required CRRT from 6/30 to 7/5 now with transition to intermittent Hemodialysis.  Given her multiple issues and comorbidities, she is felt to be at extremely high risk for perioperative anesthesia complications, and Ortho and Anesthesia have recommended surgical treatment  at a tertiary care center, of which she is being transferred to [facility].             SIGNIFICANT DIAGNOSTIC STUDIES CXR 6/26>>1. Continued cardiomegaly with vascular congestion and interstitial pulmonary edema.  2. Grossly stable small moderate left greater than right pleural effusions with airspace consolidation at both bases. No change in right perihilar wedge-shaped area of consolidation.  X-RAY Right Femur 6/26>> Comminuted  and displaced intertrochanteric fracture of the right femur. Renal Ultrasound 6/28>> 1. No acute findings.  No hydronephrosis bilaterally. 2. Cortical echogenicity within normal limits bilaterally. At least mild cortical thinning on the RIGHT. CXR 7/7>> 1. AICD in stable position. Prior cardiac valve replacement. Stable cardiomegaly. 2. Diffuse bilateral pulmonary infiltrates again noted. No significant change. Tiny left pleural effusion.  MICRO DATA  SARS-CoV-2 PCR 6/27>> Negative MRSA PCR 6/27>> Negative Blood x2 6/29>> Negative Blood x2 7/4>>No growth to date Sputum 7/4>> Normal respiratory Flora  ANTIBIOTICS Azithromycin 7/2>>7/6 Meropenem 7/4>>7/7  CONSULTS Orthopaedics Nephrology Cardiology Pulmonary/Critical Care  Palliative Care  TUBES / LINES Left Femoral Trialysis Catheter 6/30>>  Discharge Exam: General: Acute on Chronically ill appearing female, very frail, sitting in bed, in NAD Neuro: Awake, Alert, confused, follows commands, no focal deficits CV: Paced rhythm, mechanical S1, + Systolic Murmur PULM: Coarse breath sounds bilateral bases, even, nonlabored, normal effort GI: soft, nontender, nondistended, no guarding or rebound tenderness, bowel sounds + x4 Extremities:  Bilateral LE edema (symmetric), RLE rotated outward  Vitals:   01/05/19 1500 01/05/19 1600 01/05/19 1700 01/05/19 1800  BP: (!) 123/50 (!) 117/53 (!) 119/48 (!) 111/48  Pulse: 67 63 62 60  Resp: (!) 21 (!) 21 (!) 26 (!) 21  Temp:      TempSrc:      SpO2: 94% 98% 100% 100%  Weight:      Height:         Discharge Labs  BMET Recent Labs  Lab 01/02/19 1227 01/02/19 2110 01/03/19 0220 01/03/19 0504 01/03/19 0919 01/05/19 0431  NA 137 140 139 139 138 141  K 4.2 4.0 4.1 3.9 4.0 4.6  CL 102 106 106 107 105 108  CO2 25 24 25 25 23 26   GLUCOSE 95 86 83 82 87 90  BUN 25* 29* 29* 29* 28* 52*  CREATININE 1.32* 1.43* 1.42* 1.29* 1.27* 2.32*  CALCIUM 8.9 8.8* 8.7* 8.7* 9.0 8.7*  MG 2.0  1.9 2.0 1.9 1.9  --   PHOS 2.3* 2.5 2.4* 2.4* 2.3*  --     CBC Recent Labs  Lab 01/03/19 0504 01/04/19 0450 01/05/19 0431  HGB 6.9* 8.0* 8.9*  HCT 23.1* 27.9* 30.6*  WBC 11.1* 9.7 9.2  PLT 123* 150 141*    Anti-Coagulation Recent Labs  Lab 12/29/18 2154  INR 1.2       Follow-up Information    [facility] up on 01/21/2019.   Specialty: Cardiology Why: at 12:30pm Contact information: [address] [place] [phone]           Allergies as of 01/05/2019      Reactions   Ace Inhibitors    Cough   Doxycycline Nausea Only   Hydrocodone Itching, Nausea Only   Mercury    Other reaction(s): Unknown   Silver Dermatitis   Severe itching   Cephalexin Rash   Clindamycin/lincomycin Rash   Nickel Rash   Penicillins Rash   Has patient had a PCN reaction causing immediate  rash, facial/tongue/throat swelling, SOB or lightheadedness with hypotension: YES Has patient had a PCN reaction causing severe rash involving mucus membranes or skin necrosis: NO Has patient had a PCN reaction that required hospitalization NO Has patient had a PCN reaction occurring within the last 10 years: NO If all of the above answers are "NO", then may proceed with Cephalosporin use.      Medication List    STOP taking these medications   acetaminophen 650 MG CR tablet Commonly known as: TYLENOL Replaced by: acetaminophen 325 MG tablet   albuterol (2.5 MG/3ML) 0.083% nebulizer solution Commonly known as: PROVENTIL   carvedilol 3.125 MG tablet Commonly known as: COREG   furosemide 40 MG tablet Commonly known as: LASIX   hydrOXYzine 25 MG tablet Commonly known as: ATARAX/VISTARIL   Icy Hot 5 % Ptch Generic drug: Menthol   losartan 25 MG tablet Commonly known as: COZAAR   metolazone 2.5 MG tablet Commonly known as: ZAROXOLYN   polyethylene glycol 17 g packet Commonly known as: MIRALAX  / GLYCOLAX   potassium chloride SA 20 MEQ tablet Commonly known as: K-DUR   spironolactone 25 MG tablet Commonly known as: ALDACTONE   Vitamin D 50 MCG (2000 UT) tablet   warfarin 6 MG tablet Commonly known as: Coumadin   zinc sulfate 220 (50 Zn) MG capsule     TAKE these medications   acetaminophen 325 MG tablet Commonly known as: TYLENOL Take 2 tablets (650 mg total) by mouth every 6 (six) hours as needed for mild pain (or Fever >/= 101). Replaces: acetaminophen 650 MG CR tablet   acetaminophen 650 MG suppository Commonly known as: TYLENOL Place 1 suppository (650 mg total) rectally every 6 (six) hours as needed for mild pain (or Fever >/= 101).   amiodarone 200 MG tablet Commonly known as: PACERONE Take 1 tablet (200 mg total) by mouth daily.   Chlorhexidine Gluconate Cloth 2 % Pads Apply 6 each topically daily. Start taking on: January 06, 2019   heparin 1000 unit/mL Soln injection 1-6 mLs (1,000-6,000 Units total) by CRRT route as needed (Use to fill CRRT catheter with heparin 1000 units/mL per catheter volume.).   heparin 25000-0.45 UT/250ML-% infusion Inject 1,350 Units/hr into the vein continuous.   ipratropium-albuterol 0.5-2.5 (3) MG/3ML Soln Commonly known as: DUONEB Take 3 mLs by nebulization every 6 (six) hours as needed for up to 90 doses.   Melatonin 5 MG Tabs Take 1 tablet (5 mg total) by mouth at bedtime as needed (sleep).   multivitamin-lutein Caps capsule Take 1 capsule by mouth daily. Start taking on: January 06, 2019   ondansetron 4 MG/2ML Soln injection Commonly known as: ZOFRAN Inject 2 mLs (4 mg total) into the vein every 6 (six) hours as needed for nausea.   traMADol 50 MG tablet Commonly known as: ULTRAM Take 1 tablet (50 mg total) by mouth every 6 (six) hours as needed for moderate pain.   vitamin C 500 MG tablet Commonly known as: ASCORBIC ACID Take 500 mg by mouth daily.         Disposition: Stepdown  Discharged Condition:  HANIFAH ROYSE has met maximum benefit of inpatient care and is medically stable and cleared for transfer.  Patient is pending follow up as above.      Time spent on disposition:  50 Minutes.   Signed: Darel Hong, AGACNP-BC [facility] Pager: [phone] Cell: [phone]

## 2019-01-05 NOTE — Progress Notes (Signed)
[facility]  ROUNDING NOTE   Subjective:   Daughter at bedside.  Patient pleasantly confused.   Family refused dialysis last night. They want to try diuresis.   Objective:  Vital signs in last 24 hours:  Temp:  [97.9 F (36.6 C)-98.9 F (37.2 C)] 98.9 F (37.2 C) (07/07 0800) Pulse Rate:  [59-83] 67 (07/07 0900) Resp:  [15-29] 24 (07/07 0900) BP: (89-133)/(42-77) 118/46 (07/07 0900) SpO2:  [92 %-100 %] 94 % (07/07 0900)  Weight change:  Filed Weights   01/02/19 0500 01/03/19 0500 01/04/19 0458  Weight: 62.5 kg 65.7 kg 65.3 kg    Intake/Output: I/O last 3 completed shifts: In: 1003.2 [P.O.:50; I.V.:503.2; IV Piggyback:450] Out: 50 [Urine:50]   Intake/Output this shift:  Total I/O In: 43.2 [I.V.:43.2] Out: 0   Physical Exam: General: Crtiically ill   Head: Normocephalic, atraumatic. Moist oral mucosal membranes  Eyes: Anicteric, PERRL  Neck: Supple, trachea midline  Lungs:  Bilateral crackles at bases  Heart: Regular rate and rhythm, paced  Abdomen:  Soft, nontender  Extremities: ++ peripheral edema.  Neurologic: Nonfocal, moving all four extremities  Skin: No lesions  Access: Left femoral temp HD catheter 6/30 Dr. Lanney Gins    Basic Metabolic Panel: Recent Labs  Lab 01/02/19 1227 01/02/19 2110 01/03/19 0220 01/03/19 0504 01/03/19 0919 01/05/19 0431  NA 137 140 139 139 138 141  K 4.2 4.0 4.1 3.9 4.0 4.6  CL 102 106 106 107 105 108  CO2 25 24 25 25 23 26   GLUCOSE 95 86 83 82 87 90  BUN 25* 29* 29* 29* 28* 52*  CREATININE 1.32* 1.43* 1.42* 1.29* 1.27* 2.32*  CALCIUM 8.9 8.8* 8.7* 8.7* 9.0 8.7*  MG 2.0 1.9 2.0 1.9 1.9  --   PHOS 2.3* 2.5 2.4* 2.4* 2.3*  --     Liver Function Tests: Recent Labs  Lab 01/02/19 2110 01/03/19 0220 01/03/19 0504 01/03/19 0919 01/05/19 0431  AST  --   --   --   --  12*  ALT  --   --   --   --  13  ALKPHOS  --   --   --   --  69  BILITOT  --   --   --   --  0.9  PROT  --   --   --   --  6.0*   ALBUMIN 3.3* 3.2* 3.1* 3.4* 3.3*   No results for input(s): LIPASE, AMYLASE in the last 168 hours. No results for input(s): AMMONIA in the last 168 hours.  CBC: Recent Labs  Lab 01/01/19 0452 01/02/19 0506 01/03/19 0504 01/04/19 0450 01/05/19 0431  WBC 11.5* 18.5* 11.1* 9.7 9.2  NEUTROABS 9.9* 16.3*  --   --   --   HGB 7.8* 8.0* 6.9* 8.0* 8.9*  HCT 26.3* 27.2* 23.1* 27.9* 30.6*  MCV 90.4 89.5 89.9 93.3 92.7  PLT 131* 151 123* 150 141*    Cardiac Enzymes: No results for input(s): CKTOTAL, CKMB, CKMBINDEX, TROPONINI in the last 168 hours.  BNP: Invalid input(s): POCBNP  CBG: Recent Labs  Lab 12/31/18 1642  GLUCAP 108*    Microbiology: Results for orders placed or performed during the hospital encounter of 12/25/18  SARS Coronavirus 2 (CEPHEID- Performed in [place] hospital lab), Hosp Order     Status: None   Collection Time: 12/26/18 12:33 AM   Specimen: Nasopharyngeal Swab  Result Value Ref Range Status   SARS Coronavirus 2 NEGATIVE NEGATIVE Final  Comment: (NOTE) If result is NEGATIVE SARS-CoV-2 target nucleic acids are NOT DETECTED. The SARS-CoV-2 RNA is generally detectable in upper and lower  respiratory specimens during the acute phase of infection. The lowest  concentration of SARS-CoV-2 viral copies this assay can detect is 250  copies / mL. A negative result does not preclude SARS-CoV-2 infection  and should not be used as the sole basis for treatment or other  patient management decisions.  A negative result may occur with  improper specimen collection / handling, submission of specimen other  than nasopharyngeal swab, presence of viral mutation(s) within the  areas targeted by this assay, and inadequate number of viral copies  (<250 copies / mL). A negative result must be combined with clinical  observations, patient history, and epidemiological information. If result is POSITIVE SARS-CoV-2 target nucleic acids are DETECTED. The SARS-CoV-2 RNA  is generally detectable in upper and lower  respiratory specimens dur ing the acute phase of infection.  Positive  results are indicative of active infection with SARS-CoV-2.  Clinical  correlation with patient history and other diagnostic information is  necessary to determine patient infection status.  Positive results do  not rule out bacterial infection or co-infection with other viruses. If result is PRESUMPTIVE POSTIVE SARS-CoV-2 nucleic acids MAY BE PRESENT.   A presumptive positive result was obtained on the submitted specimen  and confirmed on repeat testing.  While 2019 novel coronavirus  (SARS-CoV-2) nucleic acids may be present in the submitted sample  additional confirmatory testing may be necessary for epidemiological  and / or clinical management purposes  to differentiate between  SARS-CoV-2 and other Sarbecovirus currently known to infect humans.  If clinically indicated additional testing with an alternate test  methodology [phone]) is advised. The SARS-CoV-2 RNA is generally  detectable in upper and lower respiratory sp ecimens during the acute  phase of infection. The expected result is Negative. Fact Sheet for Patients:  StrictlyIdeas.no Fact Sheet for Healthcare Providers: BankingDealers.co.za This test is not yet approved or cleared by the [place] FDA and has been authorized for detection and/or diagnosis of SARS-CoV-2 by FDA under an Emergency Use Authorization (EUA).  This EUA will remain in effect (meaning this test can be used) for the duration of the COVID-19 declaration under Section 564(b)(1) of the Act, 21 U.S.C. section 360bbb-3(b)(1), unless the authorization is terminated or revoked sooner. Performed at [facility], [address] [place], [place] [address]   MRSA PCR Screening     Status: None   Collection Time: 12/26/18  1:54 AM   Specimen: Nasal Mucosa; Nasopharyngeal  Result  Value Ref Range Status   MRSA by PCR NEGATIVE NEGATIVE Final    Comment:        The GeneXpert MRSA Assay (FDA approved for NASAL specimens only), is one component of a comprehensive MRSA colonization surveillance program. It is not intended to diagnose MRSA infection nor to guide or monitor treatment for MRSA infections. Performed at [facility], [address] [place], [place] [address]   CULTURE, BLOOD (ROUTINE X 2) w Reflex to ID Panel     Status: None   Collection Time: 12/28/18  3:09 PM   Specimen: BLOOD  Result Value Ref Range Status   Specimen Description BLOOD RIGHT ANTECUBITAL  Final   Special Requests   Final    BOTTLES DRAWN AEROBIC AND ANAEROBIC Blood Culture adequate volume   Culture   Final    NO GROWTH 5 DAYS Performed at [facility], [address] [name], [name] [address]    Report Status 01/02/2019 FINAL  Final  CULTURE, BLOOD (ROUTINE X 2) w Reflex to ID Panel     Status: None   Collection Time: 12/28/18  3:19 PM   Specimen: BLOOD  Result Value Ref Range Status   Specimen Description BLOOD BLOOD RIGHT HAND  Final   Special Requests   Final    BOTTLES DRAWN AEROBIC AND ANAEROBIC Blood Culture adequate volume   Culture   Final    NO GROWTH 5 DAYS Performed at [facility], [address] [place], [place] [address]    Report Status 01/02/2019 FINAL  Final  CULTURE, BLOOD (ROUTINE X 2) w Reflex to ID Panel     Status: None (Preliminary result)   Collection Time: 01/02/19  9:49 AM   Specimen: BLOOD  Result Value Ref Range Status   Specimen Description BLOOD RIGHT ANTECUBITAL  Final   Special Requests   Final    BOTTLES DRAWN AEROBIC AND ANAEROBIC Blood Culture results may not be optimal due to an excessive volume of blood received in culture bottles   Culture   Final    NO GROWTH 3 DAYS Performed at [facility], [address] [place], [place] [address]    Report Status PENDING  Incomplete  CULTURE, BLOOD  (ROUTINE X 2) w Reflex to ID Panel     Status: None (Preliminary result)   Collection Time: 01/02/19 10:00 AM   Specimen: BLOOD  Result Value Ref Range Status   Specimen Description BLOOD BLOOD RIGHT HAND  Final   Special Requests   Final    BOTTLES DRAWN AEROBIC AND ANAEROBIC Blood Culture results may not be optimal due to an excessive volume of blood received in culture bottles   Culture   Final    NO GROWTH 3 DAYS Performed at [facility], [address], [place], [place] [address]    Report Status PENDING  Incomplete  Culture, respiratory     Status: None   Collection Time: 01/02/19  3:08 PM   Specimen: INDUCED SPUTUM  Result Value Ref Range Status   Specimen Description   Final    INDUCED SPUTUM Performed at [facility], [address], [place], [place] [address]    Special Requests   Final    NONE Performed at [facility], [address] [place], [place] [address]    Gram Stain   Final    RARE WBC PRESENT, PREDOMINANTLY PMN FEW GRAM POSITIVE COCCI IN PAIRS FEW YEAST RARE GRAM POSITIVE RODS    Culture   Final    FEW Consistent with normal respiratory flora. Performed at [facility], [place] [address], [place], [place] [address]    Report Status 01/05/2019 FINAL  Final    Coagulation Studies: No results for input(s): LABPROT, INR in the last 72 hours.  Urinalysis: No results for input(s): COLORURINE, LABSPEC, PHURINE, GLUCOSEU, HGBUR, BILIRUBINUR, KETONESUR, PROTEINUR, UROBILINOGEN, NITRITE, LEUKOCYTESUR in the last 72 hours.  Invalid input(s): APPERANCEUR    Imaging: Dg Chest Port 1 View  Result Date: 01/05/2019 CLINICAL DATA:  Respiratory failure. EXAM: PORTABLE CHEST 1 VIEW COMPARISON:  01/03/2019. FINDINGS: AICD in stable position. Prior cardiac valve replacement. Stable cardiomegaly. Diffuse bilateral pulmonary infiltrates are again noted without significant change. Tiny left pleural effusion. No pneumothorax. IMPRESSION:  1. AICD in stable position. Prior cardiac valve replacement. Stable cardiomegaly. 2. Diffuse bilateral pulmonary infiltrates again noted. No significant  change. Tiny left pleural effusion. Electronically Signed   By: Marcello Moores  Register   On: 01/05/2019 06:51     Medications:   . acetaminophen Stopped (01/04/19 1328)  . ascorbic acid 500 mg in sodium chloride 0.9% 50 ml 0 mg (12/31/18 2041)  . heparin 1,350 Units/hr (01/05/19 0912)  . meropenem (MERREM) IV Stopped (01/04/19 2158)   . amiodarone  200 mg Oral Daily  . Chlorhexidine Gluconate Cloth  6 each Topical Daily  . cholecalciferol  2,000 Units Oral Daily  . furosemide  60 mg Intravenous Once  . multivitamin-lutein  1 capsule Oral Daily   acetaminophen, acetaminophen **OR** acetaminophen, heparin, ipratropium-albuterol, Melatonin, [DISCONTINUED] ondansetron **OR** ondansetron (ZOFRAN) IV, traMADol  Assessment/ Plan:  [name] is a 84 y.o. white female with congestive heart failure EF 25%, hypertension, atrial fibrillation, lymphedema, mitral valve replacement (mechanical valve) requiring chronic anticoagulation, COPD, osteoporosis, history of multiple lumbar compression fractures requiring kyphoplasty, history of left leg hematoma with evacuation and porcine allograft, was admitted on 12/25/2018 with right hip fracture.   1.  Acute kidney injury, chronic kidney disease stage III.  Baseline 1.37/GFR 36 from December 15, 2018 Acute renal failure secondary to acute cardiorenal syndrome and overdiuresis.  Requiring renal replacement therapy. Started on CRRT from 6/30 to 7/5. Scheduled for intermittent hemodialysis yesterday, 7/6, however patient's family decline.  - Trial of furosemide, bolus of 60mg  IV ordered x 1 - Low threshold for dialysis.   2. Right hip fracture: Appreciate ortho and anesthesia input. Currently not an ideal operative candidate.   3.  Chronic systolic congestive heart failure with EF 25%  4.  Pulmonary  hypertension with respiratory failure: requiring HFNC oxygen.   5.  Mechanical heart valve requiring chronic anticoagulation. History of endocarditis    LOS: 11 Saidi Santacroce 7/7/20209:47 AM

## 2019-01-05 NOTE — Progress Notes (Signed)
Follow-up spiritual care visit. [name] alert and engaging with minimal prompts. Daughter at bedside. Chaplain engaged in life review and joys of life (travel, horseback riding, family). [employer] faith tradition. Enjoyed living life to the fullest. Prayer requested by patient and offered.     01/05/19 1700  Clinical Encounter Type  Visited With Patient and family together  Visit Type Follow-up;Psychological support;Spiritual support  Referral From [place] Needs Prayer;Emotional

## 2019-01-05 NOTE — Progress Notes (Signed)
[place] for Heparin Drip Indication: mechanical Mitral Valve/A.fib  Patient Measurements: Height: 4\' 11"  (149.9 cm) Weight: 143 lb 15.4 oz (65.3 kg) IBW/kg (Calculated) : 43.2 Heparin Dosing Weight: 57.9kg  Vital Signs: BP: 127/61 (07/07 0300) Pulse Rate: 62 (07/07 0300)  Labs: Recent Labs    01/03/19 0220  01/03/19 0504 01/03/19 0919  01/03/19 1853 01/04/19 0450 01/05/19 0431  HGB  --    < > 6.9*  --   --   --  8.0* 8.9*  HCT  --   --  23.1*  --   --   --  27.9* 30.6*  PLT  --   --  123*  --   --   --  150 141*  HEPARINUNFRC  --   --   --   --    < > 0.47 0.40 0.87*  CREATININE 1.42*  --  1.29* 1.27*  --   --   --   --    < > = values in this interval not displayed.    Estimated Creatinine Clearance: 28.5 mL/min (A) (by C-G formula based on SCr of 1.27 mg/dL (H)).   Medical History: Past Medical History:  Diagnosis Date  . Arthritis    "back, hands, knees" (04/10/2016)  . Chronic back pain   . 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 EF 25% by echo in 08/2015 b. RHC in 08/2015 showed normal filling pressures; c. 10/2018 Echo (in setting of atrial tachycardia): EF 25-30%, impaired relaxation. Glob HK. RVSP 52mmHg. Mod dil RA/LA. Mech MV w/ nl fxn (mean grad 16mmHg). Mod TR.   Marland Kitchen Chronic kidney disease   . Compression fracture    "several; all in my back" (04/10/2016)  . GERD (gastroesophageal reflux disease)   . Hypertension   . Lymphedema   . Pacemaker 05/2017  . PAF (paroxysmal atrial fibrillation) ([place])    a. On amio & Coumadin - CHA2DS2VASc = 4.  . S/P MVR (mitral valve replacement)    a. MVR 1994 b. redo MVR in 08/2014 - on Coumadin  . Shortness of breath dyspnea     Assessment: Closed right hip fracture secondary to mechanical fall  Coagulopathy was reversed with vitamin K and INR down to 1.2 on 6/28.-Patient on Coumadin prior to admission Paroxysmal atrial  fibrillation and history of mechanical mitral valve; AV paced Rate controlled. Given history of mechanical mitral valve, patient was previously started on Heparin on 6/28 but was stopped on 6/29 due to significant drop in hemoglobin(7.8>6.1). Patient has since received PRBC transfusion and Hgb is currently 6.9. Per Dr. Saunders Revel, it is safe to restart heparin drip and has placed the consult for Pharmacy to re-initiate  Goal of Therapy:  Heparin level 0.3-0.7 units/ml Monitor platelets  Plan: 07/07 @ 0500 HL 0.87. Level is supratherapeutic. Confirmed with lab that sample was drawn correctly. Will reduce heparin infusion to 1350 units/hr and recheck HL 6 hours after infusion rate change.   Continue to monitor CBC and  anti-Xa w/ am labs daily.   Pernell Dupre, PharmD, BCPS Clinical Pharmacist 01/05/2019 5:10 AM

## 2019-01-05 NOTE — Progress Notes (Signed)
[facility] [place] [place] NAME: [name]    MR#:  [id number]  DATE OF BIRTH:  08/04/1934  SUBJECTIVE:  CHIEF COMPLAINT:   Chief Complaint  Patient presents with  . Fall  . Hip Pain   Appears somewhat confused but alert and complaining of pain in hip. Calm.  REVIEW OF SYSTEMS:  Unobtainable due to underlying medical conditions.  DRUG ALLERGIES:   Allergies  Allergen Reactions  . Ace Inhibitors     Cough  . Doxycycline Nausea Only  . Hydrocodone Itching and Nausea Only  . Mercury     Other reaction(s): Unknown  . Silver Dermatitis    Severe itching  . Cephalexin Rash  . Clindamycin/Lincomycin Rash  . Nickel Rash  . Penicillins Rash    Has patient had a PCN reaction causing immediate rash, facial/tongue/throat swelling, SOB or lightheadedness with hypotension: YES Has patient had a PCN reaction causing severe rash involving mucus membranes or skin necrosis: NO Has patient had a PCN reaction that required hospitalization NO Has patient had a PCN reaction occurring within the last 10 years: NO If all of the above answers are "NO", then may proceed with Cephalosporin use.   VITALS:  Blood pressure (!) 123/50, pulse 67, temperature 98.7 F (37.1 C), temperature source Axillary, resp. rate (!) 21, height 4\' 11"  (1.499 m), weight 65.3 kg, SpO2 94 %. PHYSICAL EXAMINATION:   Physical Exam  Constitutional: She is well-developed, well-nourished, and in no distress.  HENT:  Head: Normocephalic and atraumatic.  Right Ear: External ear normal.  Eyes: Pupils are equal, round, and reactive to light. Conjunctivae are normal. Right eye exhibits no discharge.  Neck: Normal range of motion. Neck supple. No thyromegaly present.  Cardiovascular: Normal rate.  Irregularly irregular  Pulmonary/Chest: Effort normal and breath sounds normal. No respiratory distress.  Abdominal: Soft. Bowel sounds are normal. She exhibits no distension.   Musculoskeletal:        General: No edema.     Comments: Slight discomfort and ecchymosis around right hip fracture site  Neurological:  Pleasantly confused  Skin: Skin is warm and dry. She is not diaphoretic. No erythema.  Psychiatric: Affect and judgment normal.   LABORATORY PANEL:  Female CBC Recent Labs  Lab 01/05/19 0431  WBC 9.2  HGB 8.9*  HCT 30.6*  PLT 141*   ------------------------------------------------------------------------------------------------------------------ Chemistries  Recent Labs  Lab 01/03/19 0919 01/05/19 0431  NA 138 141  K 4.0 4.6  CL 105 108  CO2 23 26  GLUCOSE 87 90  BUN 28* 52*  CREATININE 1.27* 2.32*  CALCIUM 9.0 8.7*  MG 1.9  --   AST  --  12*  ALT  --  13  ALKPHOS  --  69  BILITOT  --  0.9   RADIOLOGY:  Dg Chest Port 1 View  Result Date: 01/05/2019 CLINICAL DATA:  Respiratory failure. EXAM: PORTABLE CHEST 1 VIEW COMPARISON:  01/03/2019. FINDINGS: AICD in stable position. Prior cardiac valve replacement. Stable cardiomegaly. Diffuse bilateral pulmonary infiltrates are again noted without significant change. Tiny left pleural effusion. No pneumothorax. IMPRESSION: 1. AICD in stable position. Prior cardiac valve replacement. Stable cardiomegaly. 2. Diffuse bilateral pulmonary infiltrates again noted. No significant change. Tiny left pleural effusion. Electronically Signed   By: Marcello Moores  Register   On: 01/05/2019 06:51   ASSESSMENT AND PLAN:     1. Closed right hip fracture secondary to mechanical fall  Patient appears to have some swelling around her  right hip. Patient seen by orthopedic service. Coagulopathy was reversed with vitamin K and INR down to 1.2. Patient seen by cardiologist.  Patient is chronically very high risk for any surgical procedures.  No further cardiovascular risk assessment will be beneficial however.   Patient became hypoxic and hypotensive and had to be transferred to ICU previously Surgery currently on hold due  to multiple medical problems listed below Per ICU physician, Son still want to have the surgery done. Pt is on [facility] at [facility] for [facility]. [facility] be transferred to [facility].  2.  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  Seen by nephrologist.  Patient did not respond well to Lasix drip and had to be started on CRRT by nephrology.  Improving   3.  Paroxysmal atrial fibrillation and history of mechanical mitral valve; AV paced Rate controlled.  Resumed amiodarone. Heparin drip to prevent CVA in pre and perioperative period.  4.  Acute on chronic hypoxic respiratory failure Patient had to be transferred to ICU recently due to hypoxia requiring 6 L of oxygen via nasal cannula.  Patient uses 3 L of oxygen at home at baseline.  5.  Acute blood loss anemia Hemoglobin gradually trending down and dropped to 6.9 this morning.  1 unit of packed red blood cells ordered this morning.  Follow-up on hemoglobin level in a.m.  6.  Acute kidney injury superimposed on chronic kidney disease stage III Nephrology already following and assisting with management. Patient started on CRRT and tolerating well.  7.  Left lower extremity chronic wounds. Being managed by surgical service.  DVT prophylaxis; On heparin drip    Patient seen by palliative care team.  Patient now DNR. CODE STATUS: DNR  TOTAL TIME TAKING CARE OF THIS PATIENT: 32 minutes.   More than 50% of the time was spent in counseling/coordination of care: YES  POSSIBLE D/C IN 2-3 DAYS, DEPENDING ON CLINICAL CONDITION.   [name] M.D on 01/05/2019 at 3:54 PM  Between 7am to 6pm - Pager - [phone]  After 6pm go to [email or web address] - Proofreader  [facility]  Office  [phone]  CC: Primary care physician; Rusty Aus, MD  Note: This dictation was prepared with Dragon dictation along with smaller phrase  technology. Any transcriptional errors that result from this process are unintentional.

## 2019-01-05 NOTE — Progress Notes (Signed)
ANTICOAGULATION CONSULT NOTE  Pharmacy Consult for Heparin Drip Indication: mechanical Mitral Valve/A.fib  Patient Measurements: Height: 4\' 11"  (149.9 cm) Weight: 143 lb 15.4 oz (65.3 kg) IBW/kg (Calculated) : 43.2 Heparin Dosing Weight: 57.9kg  Vital Signs: Temp: 98.9 F (37.2 C) (07/07 0800) Temp Source: Axillary (07/07 0800) BP: 141/55 (07/07 1100) Pulse Rate: 65 (07/07 1000)  Labs: Recent Labs    01/03/19 0504 01/03/19 0919  01/04/19 0450 01/05/19 0431 01/05/19 1130  HGB 6.9*  --   --  8.0* 8.9*  --   HCT 23.1*  --   --  27.9* 30.6*  --   PLT 123*  --   --  150 141*  --   HEPARINUNFRC  --   --    < > 0.40 0.87* 0.67  CREATININE 1.29* 1.27*  --   --  2.32*  --    < > = values in this interval not displayed.    Estimated Creatinine Clearance: 15.6 mL/min (A) (by C-G formula based on SCr of 2.32 mg/dL (H)).   Medical History: Past Medical History:  Diagnosis Date  . Arthritis    "back, hands, knees" (04/10/2016)  . Chronic back pain   . 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 EF 25% by echo in 08/2015 b. RHC in 08/2015 showed normal filling pressures; c. 10/2018 Echo (in setting of atrial tachycardia): EF 25-30%, impaired relaxation. Glob HK. RVSP 48mmHg. Mod dil RA/LA. Mech MV w/ nl fxn (mean grad 68mmHg). Mod TR.   Marland Kitchen Chronic kidney disease   . Compression fracture    "several; all in my back" (04/10/2016)  . GERD (gastroesophageal reflux disease)   . Hypertension   . Lymphedema   . Pacemaker 05/2017  . PAF (paroxysmal atrial fibrillation) ([place])    a. On amio & Coumadin - CHA2DS2VASc = 4.  . S/P MVR (mitral valve replacement)    a. MVR 1994 b. redo MVR in 08/2014 - on Coumadin  . Shortness of breath dyspnea     Assessment: Closed right hip fracture secondary to mechanical fall  Coagulopathy was reversed with vitamin K and INR down to 1.2 on 6/28.-Patient on Coumadin prior to admission Paroxysmal atrial  fibrillation and history of mechanical mitral valve; AV paced Rate controlled. Given history of mechanical mitral valve, patient was previously started on Heparin on 6/28 but was stopped on 6/29 due to significant drop in hemoglobin(7.8>6.1). Patient has since received PRBC transfusion and Hgb is currently 6.9. Per Dr. Saunders Revel, it is safe to restart heparin drip and has placed the consult for Pharmacy to re-initiate  Goal of Therapy:  Heparin level 0.3-0.7 units/ml Monitor platelets  Plan: 07/07 @ 1130 HL 0.67, therapeutic. Will continue heparin infusion at 1350 units/hr and confirm HL at 2000.   Continue to monitor CBC with am labs.   Tawnya Crook, PharmD Clinical Pharmacist 01/05/2019 11:57 AM

## 2019-01-05 NOTE — Consult Note (Signed)
ORTHOPAEDICS: The patient was seen this morning awake and alert.  She states that she only rested intermittently last night. Dialysis was not performed last night at the request of the patient's son.  Yesterday I spoke at length with the patient and her children [name] and. Helene Kelp) about her current status.  I had reviewed the updates with Anesthesiology, and they still felt that the patient was extremely high risk for perioperative anesthesia complications and would be more appropriately treated at a tertiary care center.  This information to Mrs. Meininger and her children.  They had decided against transfer to [facility] this weekend and are awaiting possible transfer to [facility].  The patient is extremely deconditioned and would probably have a difficult time with any attempts at postoperative rehab.  I will continue to follow along and try to support the patient and her family.  Ran Tullis P. Holley Bouche M.D.

## 2019-01-06 MED ORDER — GENERIC EXTERNAL MEDICATION
1.00 | Status: DC
Start: 2019-01-19 — End: 2019-01-06

## 2019-01-06 MED ORDER — AMIODARONE HCL 200 MG PO TABS
200.00 | ORAL_TABLET | ORAL | Status: DC
Start: 2019-01-19 — End: 2019-01-06

## 2019-01-06 MED ORDER — HEPARIN (PORCINE) IN NACL 25000-0.45 UT/250ML-% IV SOLN
1350.00 | INTRAVENOUS | Status: DC
Start: ? — End: 2019-01-06

## 2019-01-06 MED ORDER — GENERIC EXTERNAL MEDICATION
1600.00 | Status: DC
Start: 2019-01-19 — End: 2019-01-06

## 2019-01-06 MED ORDER — ACETAMINOPHEN 325 MG PO TABS
975.00 | ORAL_TABLET | ORAL | Status: DC
Start: 2019-01-18 — End: 2019-01-06

## 2019-01-06 MED ORDER — LIDOCAINE 5 % EX PTCH
1.00 | MEDICATED_PATCH | CUTANEOUS | Status: DC
Start: 2019-01-19 — End: 2019-01-06

## 2019-01-06 MED ORDER — MULTI-VITAMIN PO TABS
1.00 | ORAL_TABLET | ORAL | Status: DC
Start: 2019-01-19 — End: 2019-01-06

## 2019-01-06 MED ORDER — MELATONIN 3 MG PO TABS
3.00 | ORAL_TABLET | ORAL | Status: DC
Start: ? — End: 2019-01-06

## 2019-01-06 MED ORDER — ONDANSETRON HCL 4 MG PO TABS
4.00 | ORAL_TABLET | ORAL | Status: DC
Start: ? — End: 2019-01-06

## 2019-01-06 MED ORDER — TRAMADOL HCL 50 MG PO TABS
25.00 | ORAL_TABLET | ORAL | Status: DC
Start: ? — End: 2019-01-06

## 2019-01-06 MED ORDER — IPRATROPIUM-ALBUTEROL 0.5-2.5 (3) MG/3ML IN SOLN
3.00 | RESPIRATORY_TRACT | Status: DC
Start: ? — End: 2019-01-06

## 2019-01-06 MED ORDER — GENERIC EXTERNAL MEDICATION
500.00 | Status: DC
Start: 2019-01-19 — End: 2019-01-06

## 2019-01-06 NOTE — Progress Notes (Signed)
Patient transported to [facility] via [place] without complication

## 2019-01-06 NOTE — Discharge Instructions (Addendum)

## 2019-01-07 LAB — CULTURE, BLOOD (ROUTINE X 2)
Culture: NO GROWTH
Culture: NO GROWTH

## 2019-01-07 MED ORDER — LIDOCAINE HCL 1 % IJ SOLN
0.50 | INTRAMUSCULAR | Status: DC
Start: ? — End: 2019-01-07

## 2019-01-07 MED ORDER — OXYCODONE HCL 5 MG PO TABS
2.50 | ORAL_TABLET | ORAL | Status: DC
Start: ? — End: 2019-01-07

## 2019-01-07 MED ORDER — FUROSEMIDE 10 MG/ML IJ SOLN
40.00 | INTRAMUSCULAR | Status: DC
Start: 2019-01-07 — End: 2019-01-07

## 2019-01-07 MED ORDER — HEPARIN (PORCINE) IN NACL 25000-0.45 UT/250ML-% IV SOLN
900.00 | INTRAVENOUS | Status: DC
Start: ? — End: 2019-01-07

## 2019-01-11 HISTORY — PX: OTHER SURGICAL HISTORY: SHX169

## 2019-01-11 MED ORDER — GENERIC EXTERNAL MEDICATION
6.25 | Status: DC
Start: 2019-01-18 — End: 2019-01-11

## 2019-01-11 MED ORDER — STERILE WATER FOR IRRIGATION IR SOLN
Status: DC
Start: ? — End: 2019-01-11

## 2019-01-11 MED ORDER — SODIUM CHLORIDE 0.9 % IR SOLN
Status: DC
Start: ? — End: 2019-01-11

## 2019-01-11 MED ORDER — MELATONIN 3 MG PO TABS
3.00 | ORAL_TABLET | ORAL | Status: DC
Start: 2019-01-18 — End: 2019-01-11

## 2019-01-12 ENCOUNTER — Encounter: Payer: Self-pay | Admitting: *Deleted

## 2019-01-12 DIAGNOSIS — J9611 Chronic respiratory failure with hypoxia: Secondary | ICD-10-CM

## 2019-01-12 MED ORDER — HEPARIN (PORCINE) IN NACL 25000-0.45 UT/250ML-% IV SOLN
850.00 | INTRAVENOUS | Status: DC
Start: ? — End: 2019-01-12

## 2019-01-12 NOTE — Progress Notes (Signed)
Pulmonary Individual Treatment Plan  Patient Details  Name: [name] MRN: [id number] Date of Birth: May 20, 1935 Referring Provider:     Pulmonary Rehab from 07/21/2018 in [facility] Cardiac and Pulmonary Rehab  Referring Provider  [name] MD      Initial Encounter Date:    Pulmonary Rehab from 07/21/2018 in [facility] Cardiac and Pulmonary Rehab  Date  07/21/18      Visit Diagnosis: Chronic respiratory failure with hypoxia, on home O2 therapy ([place])  Patient's Home Medications on Admission:  Current Outpatient Medications:  .  acetaminophen (TYLENOL) 325 MG tablet, Take 2 tablets (650 mg total) by mouth every 6 (six) hours as needed for mild pain (or Fever >/= 101)., Disp:  , Rfl:  .  acetaminophen (TYLENOL) 650 MG suppository, Place 1 suppository (650 mg total) rectally every 6 (six) hours as needed for mild pain (or Fever >/= 101)., Disp: 12 suppository, Rfl: 0 .  amiodarone (PACERONE) 200 MG tablet, Take 1 tablet (200 mg total) by mouth daily., Disp: 90 tablet, Rfl: 3 .  Chlorhexidine Gluconate Cloth 2 % PADS, Apply 6 each topically daily., Disp:  , Rfl:  .  heparin 1000 unit/mL SOLN injection, 1-6 mLs (1,000-6,000 Units total) by CRRT route as needed (Use to fill CRRT catheter with heparin 1000 units/mL per catheter volume.)., Disp:  , Rfl:  .  heparin 25000-0.45 UT/250ML-% infusion, Inject 1,350 Units/hr into the vein continuous., Disp:  , Rfl:  .  ipratropium-albuterol (DUONEB) 0.5-2.5 (3) MG/3ML SOLN, Take 3 mLs by nebulization every 6 (six) hours as needed for up to 90 doses., Disp: 360 mL, Rfl: 0 .  Melatonin 5 MG TABS, Take 1 tablet (5 mg total) by mouth at bedtime as needed (sleep)., Disp:  , Rfl: 0 .  multivitamin-lutein (OCUVITE-LUTEIN) CAPS capsule, Take 1 capsule by mouth daily., Disp:  , Rfl: 0 .  ondansetron (ZOFRAN) 4 MG/2ML SOLN injection, Inject 2 mLs (4 mg total) into the vein every 6 (six) hours as needed for nausea., Disp: 2 mL, Rfl: 0 .  traMADol (ULTRAM) 50 MG  tablet, Take 1 tablet (50 mg total) by mouth every 6 (six) hours as needed for moderate pain., Disp: 30 tablet, Rfl:  .  vitamin C (ASCORBIC ACID) 500 MG tablet, Take 500 mg by mouth daily., Disp: , Rfl:   Past Medical History: Past Medical History:  Diagnosis Date  . Arthritis    "back, hands, knees" (04/10/2016)  . Chronic back pain   . 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 EF 25% by echo in 08/2015 b. RHC in 08/2015 showed normal filling pressures; c. 10/2018 Echo (in setting of atrial tachycardia): EF 25-30%, impaired relaxation. Glob HK. RVSP 22mHg. Mod dil RA/LA. Mech MV w/ nl fxn (mean grad 566mg). Mod TR.   . Marland Kitchenhronic kidney disease   . Compression fracture    "several; all in my back" (04/10/2016)  . GERD (gastroesophageal reflux disease)   . Hypertension   . Lymphedema   . Pacemaker 05/2017  . PAF (paroxysmal atrial fibrillation) ([facility]   a. On amio & Coumadin - CHA2DS2VASc = 4.  . S/P MVR (mitral valve replacement)    a. MVR 1994 b. redo MVR in 08/2014 - on Coumadin  . Shortness of breath dyspnea     Tobacco Use: Social History   Tobacco Use  Smoking Status Former Smoker  . Packs/day: 1.00  . Years: 27.00  . Pack years: 27.00  .  Types: Cigarettes  . Quit date: 07/01/1980  . Years since quitting: 38.5  Smokeless Tobacco Never Used  Tobacco Comment   "quit smoking ~ 1980    Labs: Recent Review Flowsheet Data    Labs for ITP Cardiac and Pulmonary Rehab Latest Ref Rng & Units 12/04/2018 12/10/2018 12/15/2018   PHART 7.350 - 7.450 7.47(H) 7.45 7.49(H)   PCO2ART 32.0 - 48.0 mmHg 46 50(H) 42   HCO3 20.0 - 28.0 mmol/L 33.5(H) 34.8(H) 32.0(H)   O2SAT % 99.2 95.2 98.3       Pulmonary Assessment Scores: Pulmonary Assessment Scores    Row Name 07/21/18 1451         ADL UCSD   ADL Phase  Entry     SOB Score total  100     Rest  3     Walk  3     Stairs  4     Bath  5     Dress  5     Shop  5       CAT  Score   CAT Score  24       mMRC Score   mMRC Score  4        UCSD: Self-administered rating of dyspnea associated with activities of daily living (ADLs) 6-point scale (0 = "not at all" to 5 = "maximal or unable to do because of breathlessness")  Scoring Scores range from 0 to 120.  Minimally important difference is 5 units  CAT: CAT can identify the health impairment of COPD patients and is better correlated with disease progression.  CAT has a scoring range of zero to 40. The CAT score is classified into four groups of low (less than 10), medium (10 - 20), high (21-30) and very high (31-40) based on the impact level of disease on health status. A CAT score over 10 suggests significant symptoms.  A worsening CAT score could be explained by an exacerbation, poor medication adherence, poor inhaler technique, or progression of COPD or comorbid conditions.  CAT MCID is 2 points  mMRC: mMRC (Modified Medical Research Council) Dyspnea Scale is used to assess the degree of baseline functional disability in patients of respiratory disease due to dyspnea. No minimal important difference is established. A decrease in score of 1 point or greater is considered a positive change.   Pulmonary Function Assessment: Pulmonary Function Assessment - 07/21/18 1511      Breath   Bilateral Breath Sounds  Clear;Decreased    Shortness of Breath  Yes;Limiting activity       Exercise Target Goals: Exercise Program Goal: Individual exercise prescription set using results from initial 6 min walk test and THRR while considering  patient's activity barriers and safety.   Exercise Prescription Goal: Initial exercise prescription builds to 30-45 minutes a day of aerobic activity, 2-3 days per week.  Home exercise guidelines will be given to patient during program as part of exercise prescription that the participant will acknowledge.  Activity Barriers & Risk Stratification: Activity Barriers & Cardiac Risk  Stratification - 07/21/18 1520      Activity Barriers & Cardiac Risk Stratification   Activity Barriers  Arthritis;Back Problems;Deconditioning;Muscular Weakness;Shortness of Breath;Balance Concerns;Assistive Device   hx of multiple back fractures      6 Minute Walk: 6 Minute Walk    Row Name 07/21/18 1517         6 Minute Walk   Phase  Initial     Distance  580 feet  Walk Time  6 minutes     # of Rest Breaks  0     MPH  1.09     METS  1.01     RPE  11     Perceived Dyspnea   3     VO2 Peak  3.54     Symptoms  Yes (comment)     Comments  chronic back pain     Resting HR  72 bpm     Resting BP  126/62     Resting Oxygen Saturation   94 %     Exercise Oxygen Saturation  during 6 min walk  80 %     Max Ex. HR  83 bpm     Max Ex. BP  164/74     2 Minute Post BP  136/64       Interval HR   1 Minute HR  72     2 Minute HR  76     3 Minute HR  79     4 Minute HR  69     5 Minute HR  71     6 Minute HR  83     2 Minute Post HR  73     Interval Heart Rate?  Yes       Interval Oxygen   Interval Oxygen?  Yes     Baseline Oxygen Saturation %  94 %     1 Minute Oxygen Saturation %  88 %     1 Minute Liters of Oxygen  2 L continuous     2 Minute Oxygen Saturation %  83 %     2 Minute Liters of Oxygen  2 L     3 Minute Oxygen Saturation %  82 %     3 Minute Liters of Oxygen  2 L     4 Minute Oxygen Saturation %  82 %     4 Minute Liters of Oxygen  2 L     5 Minute Oxygen Saturation %  80 %     5 Minute Liters of Oxygen  2 L     6 Minute Oxygen Saturation %  87 %     6 Minute Liters of Oxygen  2 L     2 Minute Post Oxygen Saturation %  98 %     2 Minute Post Liters of Oxygen  2 L       Oxygen Initial Assessment: Oxygen Initial Assessment - 07/21/18 1510      Home Oxygen   Home Oxygen Device  Home Concentrator;E-Tanks    Sleep Oxygen Prescription  Continuous    Liters per minute  2    Home Exercise Oxygen Prescription  Continuous    Liters per minute  2     Home at Rest Exercise Oxygen Prescription  Continuous    Liters per minute  2    Compliance with Home Oxygen Use  Yes      Initial 6 min Walk   Oxygen Used  Continuous;E-Tanks    Liters per minute  2      Program Oxygen Prescription   Program Oxygen Prescription  Continuous    Liters per minute  3      Intervention   Short Term Goals  To learn and exhibit compliance with exercise, home and travel O2 prescription;To learn and understand importance of monitoring SPO2 with pulse oximeter and demonstrate accurate use of the pulse oximeter.;To learn and  understand importance of maintaining oxygen saturations>88%;To learn and demonstrate proper pursed lip breathing techniques or other breathing techniques.    Long  Term Goals  Verbalizes importance of monitoring SPO2 with pulse oximeter and return demonstration;Exhibits compliance with exercise, home and travel O2 prescription;Maintenance of O2 saturations>88%;Exhibits proper breathing techniques, such as pursed lip breathing or other method taught during program session;Compliance with respiratory medication       Oxygen Re-Evaluation: Oxygen Re-Evaluation    Row Name 07/27/18 1345 08/28/18 1225 10/12/18 1209 11/03/18 1127       Program Oxygen Prescription   Program Oxygen Prescription  Continuous  Continuous  Continuous  Continuous    Liters per minute  3  3  3  3       Home Oxygen   Home Oxygen Device  Home Concentrator;E-Tanks  Home Concentrator;E-Tanks  Home Concentrator;E-Tanks  Home Concentrator;E-Tanks    Sleep Oxygen Prescription  Continuous  Continuous  Continuous  Continuous    Liters per minute  2  2  2  2     Home Exercise Oxygen Prescription  Continuous  Continuous  Continuous  Continuous    Liters per minute  2  2  2  2     Home at Rest Exercise Oxygen Prescription  Continuous  Continuous  Continuous  Continuous    Liters per minute  2  2  2  2     Compliance with Home Oxygen Use  Yes  Yes  Yes  Yes      Goals/Expected  Outcomes   Short Term Goals  To learn and exhibit compliance with exercise, home and travel O2 prescription;To learn and understand importance of monitoring SPO2 with pulse oximeter and demonstrate accurate use of the pulse oximeter.;To learn and understand importance of maintaining oxygen saturations>88%;To learn and demonstrate proper pursed lip breathing techniques or other breathing techniques.  To learn and exhibit compliance with exercise, home and travel O2 prescription;To learn and understand importance of monitoring SPO2 with pulse oximeter and demonstrate accurate use of the pulse oximeter.;To learn and understand importance of maintaining oxygen saturations>88%;To learn and demonstrate proper pursed lip breathing techniques or other breathing techniques.  To learn and exhibit compliance with exercise, home and travel O2 prescription;To learn and understand importance of monitoring SPO2 with pulse oximeter and demonstrate accurate use of the pulse oximeter.;To learn and understand importance of maintaining oxygen saturations>88%;To learn and demonstrate proper pursed lip breathing techniques or other breathing techniques.  To learn and exhibit compliance with exercise, home and travel O2 prescription;To learn and understand importance of monitoring SPO2 with pulse oximeter and demonstrate accurate use of the pulse oximeter.;To learn and understand importance of maintaining oxygen saturations>88%;To learn and demonstrate proper pursed lip breathing techniques or other breathing techniques.    Long  Term Goals  Verbalizes importance of monitoring SPO2 with pulse oximeter and return demonstration;Exhibits compliance with exercise, home and travel O2 prescription;Maintenance of O2 saturations>88%;Exhibits proper breathing techniques, such as pursed lip breathing or other method taught during program session;Compliance with respiratory medication  Verbalizes importance of monitoring SPO2 with pulse  oximeter and return demonstration;Exhibits compliance with exercise, home and travel O2 prescription;Maintenance of O2 saturations>88%;Exhibits proper breathing techniques, such as pursed lip breathing or other method taught during program session;Compliance with respiratory medication  Verbalizes importance of monitoring SPO2 with pulse oximeter and return demonstration;Exhibits compliance with exercise, home and travel O2 prescription;Maintenance of O2 saturations>88%;Exhibits proper breathing techniques, such as pursed lip breathing or other method taught during program session;Compliance with respiratory  medication  Verbalizes importance of monitoring SPO2 with pulse oximeter and return demonstration;Exhibits compliance with exercise, home and travel O2 prescription;Maintenance of O2 saturations>88%;Exhibits proper breathing techniques, such as pursed lip breathing or other method taught during program session;Compliance with respiratory medication    Comments  Reviewed PLB technique with pt.  Talked about how it work and it's important to maintaining his exercise saturations.   Reviewed PLB technique with pt.  Talked about how it work and it's important to maintaining his exercise saturations.   [name] has been compliant with her oxygen therapy and monitors her saturations routinely at home.  She has been using her PLB and diaphragmatic breathing to help her breathe better.  She has been having a hard time with her breathing as her heart failure has her fluid volume overload which impacts her breathing.  She noted that using her PLB is helpful with her breath control.   [name] continues to struggle with her breathing due to her heart failure.  She has been compliant with her oxygen as it does help her feel better.     Goals/Expected Outcomes  Short: Become more profiecient at using PLB.   Long: Become independent at using PLB.  Short: Jannat will focus more on standing up right, practicing PLB, and  be mindful of any change in her cough. Long: Katrenia will be independent in her PLB  Short: Contnue to use PLB.  Long: Continues compliance  Short: Continue to work to improve breathing  Long: Continued compliance       Oxygen Discharge (Final Oxygen Re-Evaluation): Oxygen Re-Evaluation - 11/03/18 1127      Program Oxygen Prescription   Program Oxygen Prescription  Continuous    Liters per minute  3      Home Oxygen   Home Oxygen Device  Home Concentrator;E-Tanks    Sleep Oxygen Prescription  Continuous    Liters per minute  2    Home Exercise Oxygen Prescription  Continuous    Liters per minute  2    Home at Rest Exercise Oxygen Prescription  Continuous    Liters per minute  2    Compliance with Home Oxygen Use  Yes      Goals/Expected Outcomes   Short Term Goals  To learn and exhibit compliance with exercise, home and travel O2 prescription;To learn and understand importance of monitoring SPO2 with pulse oximeter and demonstrate accurate use of the pulse oximeter.;To learn and understand importance of maintaining oxygen saturations>88%;To learn and demonstrate proper pursed lip breathing techniques or other breathing techniques.    Long  Term Goals  Verbalizes importance of monitoring SPO2 with pulse oximeter and return demonstration;Exhibits compliance with exercise, home and travel O2 prescription;Maintenance of O2 saturations>88%;Exhibits proper breathing techniques, such as pursed lip breathing or other method taught during program session;Compliance with respiratory medication    Comments  [name] continues to struggle with her breathing due to her heart failure.  She has been compliant with her oxygen as it does help her feel better.     Goals/Expected Outcomes  Short: Continue to work to improve breathing  Long: Continued compliance       Initial Exercise Prescription: Initial Exercise Prescription - 07/21/18 1500      Date of Initial Exercise RX and Referring Provider    Date  07/21/18    Referring Provider  [name] MD      Oxygen   Oxygen  Continuous    Liters  2  T5 Nustep   Level  1    SPM  80    Minutes  15    METs  1.5      Biostep-RELP   Level  1    SPM  50    Minutes  15    METs  1      Track   Laps  11    Minutes  15    METs  1.5      Prescription Details   Frequency (times per week)  3    Duration  Progress to 45 minutes of aerobic exercise without signs/symptoms of physical distress      Intensity   THRR 40-80% of Max Heartrate  99-126    Ratings of Perceived Exertion  11-13    Perceived Dyspnea  0-4      Progression   Progression  Continue to progress workloads to maintain intensity without signs/symptoms of physical distress.      Resistance Training   Training Prescription  Yes    Weight  3 lbs    Reps  10-15       Perform Capillary Blood Glucose checks as needed.  Exercise Prescription Changes: Exercise Prescription Changes    Row Name 07/21/18 1500 07/28/18 1400 08/11/18 1300 08/12/18 1200 08/25/18 1400     Response to Exercise   Blood Pressure (Admit)  126/62  112/60  118/64  -  124/66   Blood Pressure (Exercise)  164/74  112/60  -  -  -   Blood Pressure (Exit)  136/64  120/62  120/80  -  112/60   Heart Rate (Admit)  72 bpm  73 bpm  68 bpm  -  62 bpm   Heart Rate (Exercise)  83 bpm  80 bpm  74 bpm  -  69 bpm   Heart Rate (Exit)  73 bpm  61 bpm  60 bpm  -  66 bpm   Oxygen Saturation (Admit)  94 %  95 %  85 %  -  98 %   Oxygen Saturation (Exercise)  80 %  89 %  92 %  -  87 %   Oxygen Saturation (Exit)  98 %  99 %  98 %  -  94 %   Rating of Perceived Exertion (Exercise)  11  15  15   -  15   Perceived Dyspnea (Exercise)  3  2  3   -  2   Symptoms  chronic back pain and SOB  SOB  SOB  -  none   Comments  walk test results  first full day of exercise  -  -  -   Duration  -  Progress to 45 minutes of aerobic exercise without signs/symptoms of physical distress  Continue with 45 min of aerobic exercise  without signs/symptoms of physical distress.  -  Continue with 45 min of aerobic exercise without signs/symptoms of physical distress.   Intensity  -  THRR unchanged  THRR unchanged  -  THRR unchanged     Progression   Progression  -  Continue to progress workloads to maintain intensity without signs/symptoms of physical distress.  Continue to progress workloads to maintain intensity without signs/symptoms of physical distress.  -  Continue to progress workloads to maintain intensity without signs/symptoms of physical distress.   Average METs  -  1.67  1.77  -  1.95     Resistance Training   Training Prescription  -  Yes  Yes  -  Yes   Weight  -  3 lbs  ROM  -  ROM   Reps  -  10-15  10-15  -  10-15     Interval Training   Interval Training  -  No  No  -  No     Oxygen   Oxygen  -  Continuous  Continuous  -  Continuous   Liters  -  3  3  -  3     T5 Nustep   Level  -  1  1  -  1   Minutes  -  15  15  -  15   METs  -  1.5  1.7  -  2     Biostep-RELP   Level  -  1  1  -  2   Minutes  -  15  15  -  15   METs  -  2  2  -  2     Track   Laps  -  11  13  -  18   Minutes  -  15  15  -  15   METs  -  1.5  1.6  -  1.84     Home Exercise Plan   Plans to continue exercise at  -  -  -  Home (comment) walking, PT exercises  Home (comment) walking, PT exercises   Frequency  -  -  -  Add 2 additional days to program exercise sessions.  Add 2 additional days to program exercise sessions.   Initial Home Exercises Provided  -  -  -  08/12/18  08/12/18   Row Name 09/07/18 1600 09/23/18 1500           Response to Exercise   Blood Pressure (Admit)  112/82  112/60      Blood Pressure (Exit)  118/62  104/62      Heart Rate (Admit)  72 bpm  80 bpm      Heart Rate (Exercise)  73 bpm  78 bpm      Heart Rate (Exit)  62 bpm  63 bpm      Oxygen Saturation (Admit)  90 %  93 %      Oxygen Saturation (Exercise)  84 % ran out of air on her tank and she didn't know  86 %      Oxygen Saturation (Exit)   98 %  93 %      Rating of Perceived Exertion (Exercise)  15  15      Perceived Dyspnea (Exercise)  2  3      Symptoms  none  none      Duration  Continue with 45 min of aerobic exercise without signs/symptoms of physical distress.  Continue with 45 min of aerobic exercise without signs/symptoms of physical distress.      Intensity  THRR unchanged  THRR unchanged        Progression   Progression  Continue to progress workloads to maintain intensity without signs/symptoms of physical distress.  Continue to progress workloads to maintain intensity without signs/symptoms of physical distress.      Average METs  1.85  1.8        Resistance Training   Training Prescription  Yes  Yes      Weight  ROM  ROM      Reps  10-15  10-15  Interval Training   Interval Training  No  No        Oxygen   Oxygen  Continuous  Continuous      Liters  3  3        T5 Nustep   Level  2  2      Minutes  15  15      METs  1.8  1.8        Biostep-RELP   Level  3  3      Minutes  15  15      METs  3  2        Track   Laps  17  13      Minutes  15  15      METs  1.77  1.6        Home Exercise Plan   Plans to continue exercise at  Home (comment) walking, PT exercises  Home (comment) walking, PT exercises      Frequency  Add 2 additional days to program exercise sessions.  Add 2 additional days to program exercise sessions.      Initial Home Exercises Provided  08/12/18  08/12/18         Exercise Comments: Exercise Comments    Row Name 07/27/18 1251           Exercise Comments  First full day of exercise!  Patient was oriented to gym and equipment including functions, settings, policies, and procedures.  Patient's individual exercise prescription and treatment plan were reviewed.  All starting workloads were established based on the results of the 6 minute walk test done at initial orientation visit.  The plan for exercise progression was also introduced and progression will be customized  based on patient's performance and goals.          Exercise Goals and Review: Exercise Goals    Row Name 07/21/18 1524             Exercise Goals   Increase Physical Activity  Yes       Intervention  Provide advice, education, support and counseling about physical activity/exercise needs.;Develop an individualized exercise prescription for aerobic and resistive training based on initial evaluation findings, risk stratification, comorbidities and participant's personal goals.       Expected Outcomes  Short Term: Attend rehab on a regular basis to increase amount of physical activity.;Long Term: Add in home exercise to make exercise part of routine and to increase amount of physical activity.;Long Term: Exercising regularly at least 3-5 days a week.       Increase Strength and Stamina  Yes       Intervention  Develop an individualized exercise prescription for aerobic and resistive training based on initial evaluation findings, risk stratification, comorbidities and participant's personal goals.;Provide advice, education, support and counseling about physical activity/exercise needs.       Expected Outcomes  Short Term: Increase workloads from initial exercise prescription for resistance, speed, and METs.;Long Term: Improve cardiorespiratory fitness, muscular endurance and strength as measured by increased METs and functional capacity (6MWT);Short Term: Perform resistance training exercises routinely during rehab and add in resistance training at home       Able to understand and use rate of perceived exertion (RPE) scale  Yes       Intervention  Provide education and explanation on how to use RPE scale       Expected Outcomes  Short Term: Able to use RPE daily  in rehab to express subjective intensity level;Long Term:  Able to use RPE to guide intensity level when exercising independently       Able to understand and use Dyspnea scale  Yes       Intervention  Provide education and explanation on  how to use Dyspnea scale       Expected Outcomes  Short Term: Able to use Dyspnea scale daily in rehab to express subjective sense of shortness of breath during exertion;Long Term: Able to use Dyspnea scale to guide intensity level when exercising independently       Knowledge and understanding of Target Heart Rate Range (THRR)  Yes       Intervention  Provide education and explanation of THRR including how the numbers were predicted and where they are located for reference       Expected Outcomes  Short Term: Able to state/look up THRR;Short Term: Able to use daily as guideline for intensity in rehab;Long Term: Able to use THRR to govern intensity when exercising independently       Able to check pulse independently  Yes       Intervention  Provide education and demonstration on how to check pulse in carotid and radial arteries.;Review the importance of being able to check your own pulse for safety during independent exercise       Expected Outcomes  Short Term: Able to explain why pulse checking is important during independent exercise;Long Term: Able to check pulse independently and accurately       Understanding of Exercise Prescription  Yes       Intervention  Provide education, explanation, and written materials on patient's individual exercise prescription       Expected Outcomes  Short Term: Able to explain program exercise prescription;Long Term: Able to explain home exercise prescription to exercise independently          Exercise Goals Re-Evaluation : Exercise Goals Re-Evaluation    Row Name 07/27/18 1251 08/11/18 1257 08/12/18 1230 08/25/18 1455 09/07/18 1623     Exercise Goal Re-Evaluation   Exercise Goals Review  Increase Physical Activity;Able to understand and use rate of perceived exertion (RPE) scale;Increase Strength and Stamina;Able to understand and use Dyspnea scale  Increase Physical Activity;Increase Strength and Stamina;Understanding of Exercise Prescription  Increase  Physical Activity;Able to understand and use rate of perceived exertion (RPE) scale;Knowledge and understanding of Target Heart Rate Range (THRR);Understanding of Exercise Prescription;Increase Strength and Stamina;Able to understand and use Dyspnea scale;Able to check pulse independently  Increase Physical Activity;Increase Strength and Stamina;Understanding of Exercise Prescription  Increase Physical Activity;Increase Strength and Stamina;Understanding of Exercise Prescription   Comments  Reviewed RPE scale, THR and program prescription with pt today.  Pt voiced understanding and was given a copy of goals to take home  [name] is off to a good start in rehab.  She is already up to 13 laps on the track.  She is not using weight due to her neck and spine, as they aggravated more so.  We will be reviewing home exercise guidelines soon.  We will continue to monitor her progress.   Reviewed home exercise with pt today.  Pt plans to walk and do PT exercises at home for exercise.  Reviewed THR, pulse, RPE, sign and symptoms, and when to call 911 or MD.  Also discussed weather considerations and indoor options.  Pt voiced understanding.  [name] has been doing well in rehab.  She is up to level 2 on BioStep.  She is ready to increase her T5 NuStep.  We will continue to monitor her progess.   [name] continues to do well in rehab.  She is moving better overall.  She is up to 17 laps on the track.  We will continue to monitor her progress.    Expected Outcomes  Short: Use RPE daily to regulate intensity. Long: Follow program prescription in THR.  Short: Review home exercise guidelines.  Long: Continue to follow program prescription.   Short: Start to add in walking at home.  Long: Continue to increase activity levels  Short: Increase T5 NuStep.  Long: Continue to increase strength and stamina.  Short: Continue to encourage more movement.  Long: Continue to increase strength and stamina.    [place] Name 09/23/18 1516  10/12/18 1201 11/03/18 1109         Exercise Goal Re-Evaluation   Exercise Goals Review  Increase Physical Activity;Increase Strength and Stamina;Understanding of Exercise Prescription  Increase Physical Activity;Increase Strength and Stamina;Understanding of Exercise Prescription  Increase Physical Activity;Increase Strength and Stamina;Understanding of Exercise Prescription     Comments  [name] has been doing well in rehab. She has tried her best to maintain.  She should be walking at home and has access to our videos as well. We will continue to monitor her progress at home.   [name] has been doing some of her PT exercises at home.  She has not been getting our emails but will check again for other options.   [name] has not been able to exercise.  She is still struggling with her breathing and not feeling up to doing much.  She has a lot of fluid on board and is working with her doctors.      Expected Outcomes  Short: Continue to move more at home.  Long: Continue to increase activity levels.   Short: Continue to move as much as she can.  Long: Continue to maintain stamina.   Short: Try to do some PT exercises to maintain strength.  Long: Continue to work on activity.         Discharge Exercise Prescription (Final Exercise Prescription Changes): Exercise Prescription Changes - 09/23/18 1500      Response to Exercise   Blood Pressure (Admit)  112/60    Blood Pressure (Exit)  104/62    Heart Rate (Admit)  80 bpm    Heart Rate (Exercise)  78 bpm    Heart Rate (Exit)  63 bpm    Oxygen Saturation (Admit)  93 %    Oxygen Saturation (Exercise)  86 %    Oxygen Saturation (Exit)  93 %    Rating of Perceived Exertion (Exercise)  15    Perceived Dyspnea (Exercise)  3    Symptoms  none    Duration  Continue with 45 min of aerobic exercise without signs/symptoms of physical distress.    Intensity  THRR unchanged      Progression   Progression  Continue to progress workloads to maintain  intensity without signs/symptoms of physical distress.    Average METs  1.8      Resistance Training   Training Prescription  Yes    Weight  ROM    Reps  10-15      Interval Training   Interval Training  No      Oxygen   Oxygen  Continuous    Liters  3      T5 Nustep   Level  2    Minutes  15    METs  1.8      Biostep-RELP   Level  3    Minutes  15    METs  2      Track   Laps  13    Minutes  15    METs  1.6      Home Exercise Plan   Plans to continue exercise at  Home (comment)   walking, PT exercises   Frequency  Add 2 additional days to program exercise sessions.    Initial Home Exercises Provided  08/12/18       Nutrition:  Target Goals: Understanding of nutrition guidelines, daily intake of sodium <1516m, cholesterol <2073m calories 30% from fat and 7% or less from saturated fats, daily to have 5 or more servings of fruits and vegetables.  Biometrics: Pre Biometrics - 07/21/18 1525      Pre Biometrics   Height  5' 1.6" (1.565 m)    Weight  144 lb 4.8 oz (65.5 kg)    Waist Circumference  36 inches    Hip Circumference  38 inches    Waist to Hip Ratio  0.95 %    BMI (Calculated)  26.72    Single Leg Stand  1.88 seconds        Nutrition Therapy Plan and Nutrition Goals: Nutrition Therapy & Goals - 09/09/18 1240      Nutrition Therapy   Fiber  20 grams    Whole Grain Foods  3 servings    Fruits and Vegetables  5 servings/day    Sodium  1.2 grams   Pt states per MD     Personal Nutrition Goals   Nutrition Goal  ST: have an easier time eating low sodium and enjoying it LT: get to down to sodium goal    Comments  Talked about how she is reading her labels and not eating foods she likes; talked about choosing lower salt foods and how to improve flavor. Showed pt how to read label using sample.       Intervention Plan   Intervention  Prescribe, educate and counsel regarding individualized specific dietary modifications aiming towards targeted core  components such as weight, hypertension, lipid management, diabetes, heart failure and other comorbidities.    Expected Outcomes  Long Term Goal: Adherence to prescribed nutrition plan.;Short Term Goal: Understand basic principles of dietary content, such as calories, fat, sodium, cholesterol and nutrients.       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 She has an appetite, but has a hard time deciding what to eat. She tries to eat low sodium as much as she can.        Expected Outcome  Short: [name] make low sodium choices on a daily basis. 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 She has an appetite, but has a hard time deciding what to eat. She tries to eat low sodium as much as she can.     Expected Outcome  Short: MaAnaelleill make low sodium choices on a  daily basis. Long: Ashleigh will become indpendent with maintaining a heart healthy diet       Psychosocial: Target Goals: Acknowledge presence or absence of significant depression and/or stress, maximize coping skills, provide positive support system. Participant is able to verbalize types and ability to use techniques and skills needed for reducing stress and depression.   Initial Review & Psychosocial Screening: Initial Psych Review & Screening - 07/21/18 1511      Initial Review   Current issues with  Current Stress Concerns    Source of Stress Concerns  Chronic Illness;Family    Comments  She states her son stresses her out. She states that he tries to be a doctor for her.      Family Dynamics   Good Support System?  Yes     Comments  She can look to her daugter for support who lives with her.      Barriers   Psychosocial barriers to participate in program  The patient should benefit from training in stress management and relaxation.      Screening Interventions   Interventions  Encouraged to exercise;To provide support and resources with identified psychosocial needs;Program counselor consult;Provide feedback about the scores to participant    Expected Outcomes  Short Term goal: Utilizing psychosocial counselor, staff and physician to assist with identification of specific Stressors or current issues interfering with healing process. Setting desired goal for each stressor or current issue identified.;Long Term Goal: Stressors or current issues are controlled or eliminated.;Short Term goal: Identification and review with participant of any Quality of Life or Depression concerns found by scoring the questionnaire.;Long Term goal: The participant improves quality of Life and PHQ9 Scores as seen by post scores and/or verbalization of changes       Quality of Life Scores:  Scores of 19 and below usually indicate a poorer quality of life in these areas.  A difference of  2-3 points is a clinically meaningful difference.  A difference of 2-3 points in the total score of the Quality of Life Index has been associated with significant improvement in overall quality of life, self-image, physical symptoms, and general health in studies assessing change in quality of life.  PHQ-9: Recent Review Flowsheet Data    Depression screen [facility] 2/9 09/09/2018 08/12/2018 07/27/2018 07/21/2018 10/27/2017   Decreased Interest 0 1 0 1 0   Down, Depressed, Hopeless 1 1 0 2 0   PHQ - 2 Score 1 2 0 3 0   Altered sleeping 2 3 3 3  -   Tired, decreased energy 1 3 2 2  -   Change in appetite 1 1 3 3  -   Feeling bad or failure about yourself  0 0 1 2 -   Trouble concentrating 1 1 2 2  -   Moving slowly or fidgety/restless 0 0 1 1 -   Suicidal thoughts  0 0 0 0 -   PHQ-9 Score 6 10 12 16  -   Difficult doing work/chores Somewhat difficult Somewhat difficult Somewhat difficult Very difficult -     Interpretation of Total Score  Total Score Depression Severity:  1-4 = Minimal depression, 5-9 = Mild depression, 10-14 = Moderate depression, 15-19 = Moderately severe depression, 20-27 = Severe depression   Psychosocial Evaluation and Intervention: Psychosocial Evaluation - 07/27/18 1230      Psychosocial Evaluation & Interventions   Interventions  Stress management education;Encouraged to exercise with the program and follow exercise prescription    Comments  [name])  has returned to this program after several years of being out.  She reports having recent back surgery and a heart valve replaced and has chronic back pain.  She has a strong support system with a son and daughter and lots of friends and neighbors.  She also has a private home health agency who comes daily in the mornings and helps her.  She reports sleeping "terrible" with about 4 hours/night and her appetite was good until recently.  She denies a history of depression or anxiety but states she has stress with her health and her adult children insisting that she do certain things like attending this program.  She has goals to "get my kids off my back!"  Staff will follow with her.    Expected Outcomes  Short:  [name] will exercise for her health and for stress management.  Long:  She will learn ways to sleep better and manage her stress in more positive ways.     Continue Psychosocial Services   Follow up required by staff       Psychosocial Re-Evaluation: Psychosocial Re-Evaluation    [place] Name 08/12/18 1445 09/07/18 1218 09/09/18 1415 10/12/18 1204 11/03/18 1111     Psychosocial Re-Evaluation   Current issues with  Current Stress Concerns  Current Sleep Concerns  Current Sleep Concerns  Current Stress Concerns  Current Stress Concerns   Comments  Reviewed patient  health questionnaire (PHQ-9) with patient for follow up. Previously, patients score indicated signs/symptoms of depression.  Reviewed to see if patient is improving symptom wise while in program.  Score improved and patient states that it is because she is able to exercise and condition herself. Her Sleep is still lacking and finds it hard to stay asleep.  Counselor followed up with patient regarding program engagement and perceptions.  Patient reported that sleep continues to be difficult as pain in her shoulders wakes her up.  She reported that medicine helps; she feels that exercise contributes to shoulder pain and said that she sleeps better on the weekends.    Reviewed patient health questionnaire (PHQ-9) with patient for follow up. Previously, patients score indicated signs/symptoms of depression.  Reviewed to see if patient is improving symptom wise while in program.  Score improved and patient states that it is because she has been able to come and exercise and meet new people. She states that she is not sleeping well and has never slept well.  [name] is such a positive person.  She is doing the best she can.  She has stayed connected with another pt from her class and they talk each day.  She is not bothered by being stuck at home.  Her biggest stressor has been her weight and breathing.  She continues to have fluid volume overload.  She was seen in the office last week and has another follow tomorrow to see if her new medication is working for her.  Other than her breathing, she is doing well.   [name] is having a hard with her breathing.  She has been working with her doctors to get better control.  Today, she was headed to Heart Failure clinic for IV lasix to try to get her fluid control.  She has been talking with her classmates almost daily to keep each other company.  Her kids have also been in constant contact with her and helping her get to her appointments.  Her breathing really has her  discouraged and she wants to feel better!  She  has faith that her doctors will figure it out.    Expected Outcomes  Short: Continue to attend [facility] regularly for regular exercise and social engagement. Long: Continue to improve symptoms and manage a positive mental state.  Short term goals: to continue to exercise regularly; long term goals: for exercise to not impact sleep patterns  Short: Continue to attend [facility] regularly for regular exercise and social engagement. Long: Continue to improve symptoms and manage a positive mental state.  Short: Continue to stay in contact with friends and family.  Long: Continue to improve her breathing.   Short: Continue to work with her doctors for her breathing.  Long: Continue to stay on contact with friends.    Interventions  Encouraged to attend Pulmonary Rehabilitation for the exercise  -  Encouraged to attend Pulmonary Rehabilitation for the exercise  -  Encouraged to attend Pulmonary Rehabilitation for the exercise   Continue Psychosocial Services   Follow up required by staff  Follow up required by staff  Follow up required by staff  -  Follow up required by staff      Psychosocial Discharge (Final Psychosocial Re-Evaluation): Psychosocial Re-Evaluation - 11/03/18 1111      Psychosocial Re-Evaluation   Current issues with  Current Stress Concerns    Comments  [name] is having a hard with her breathing.  She has been working with her doctors to get better control.  Today, she was headed to Heart Failure clinic for IV lasix to try to get her fluid control.  She has been talking with her classmates almost daily to keep each other company.  Her kids have also been in constant contact with her and helping her get to her appointments.  Her breathing really has her discouraged and she wants to feel better!  She has faith that her doctors will figure it out.     Expected Outcomes  Short: Continue to work with her doctors for her breathing.  Long: Continue  to stay on contact with friends.     Interventions  Encouraged to attend Pulmonary Rehabilitation for the exercise    Continue Psychosocial Services   Follow up required by staff       Education: Education Goals: Education classes will be provided on a weekly basis, covering required topics. Participant will state understanding/return demonstration of topics presented.  Learning Barriers/Preferences: Learning Barriers/Preferences - 07/21/18 1516      Learning Barriers/Preferences   Learning Barriers  Sight   wears glasses   Learning Preferences  None       Education Topics:  Initial Evaluation Education: - Verbal, written and demonstration of respiratory meds, oximetry and breathing techniques. Instruction on use of nebulizers and MDIs and importance of monitoring MDI activations.   Pulmonary Rehab from 09/09/2018 in [facility] Cardiac and Pulmonary Rehab  Date  07/21/18  Educator  [facility]  Instruction Review Code  1- Verbalizes Understanding      General Nutrition Guidelines/Fats and Fiber: -Group instruction provided by verbal, written material, models and posters to present the general guidelines for heart healthy nutrition. Gives an explanation and review of dietary fats and fiber.   Pulmonary Rehab from 09/09/2018 in [facility] Cardiac and Pulmonary Rehab  Date  08/12/18  Educator  [facility]  Instruction Review Code  1- Verbalizes Understanding      Controlling Sodium/Reading Food Labels: -Group verbal and written material supporting the discussion of sodium use in heart healthy nutrition. Review and explanation with models, verbal and written materials for utilization  of the food label.   Pulmonary Rehab from 09/09/2018 in [facility] Cardiac and Pulmonary Rehab  Date  08/19/18  Educator  [facility]  Instruction Review Code  1- Verbalizes Understanding      Exercise Physiology & General Exercise Guidelines: - Group verbal and written instruction with models to review the exercise physiology of the  cardiovascular system and associated critical values. Provides general exercise guidelines with specific guidelines to those with heart or lung disease.    Cardiac Rehab from 02/14/2016 in [facility] Cardiac and Pulmonary Rehab  Date  02/05/16  Educator  [facility]  Instruction Review Code (retired)  2- meets goals/outcomes      Aerobic Exercise & Resistance Training: - Gives group verbal and written instruction on the various components of exercise. Focuses on aerobic and resistive training programs and the benefits of this training and how to safely progress through these programs.   Cardiac Rehab from 02/14/2016 in [facility] Cardiac and Pulmonary Rehab  Date  02/07/16  Educator  [facility]  Instruction Review Code (retired)  2- Statistician, Balance, Mind/Body Relaxation: Provides group verbal/written instruction on the benefits of flexibility and balance training, including mind/body exercise modes such as yoga, pilates and tai chi.  Demonstration and skill practice provided.   Pulmonary Rehab from 09/09/2018 in [facility] Cardiac and Pulmonary Rehab  Date  07/29/18  Educator  [facility]  Instruction Review Code  1- Verbalizes Understanding      Stress and Anxiety: - Provides group verbal and written instruction about the health risks of elevated stress and causes of high stress.  Discuss the correlation between heart/lung disease and anxiety and treatment options. Review healthy ways to manage with stress and anxiety.   Pulmonary Rehab from 09/09/2018 in [facility] Cardiac and Pulmonary Rehab  Date  08/05/18  Educator  [facility]  Instruction Review Code  1- Verbalizes Understanding      Depression: - Provides group verbal and written instruction on the correlation between heart/lung disease and depressed mood, treatment options, and the stigmas associated with seeking treatment.   Pulmonary Rehab from 09/09/2018 in [facility] Cardiac and Pulmonary Rehab  Date  09/02/18  Educator  [place]  Instruction  Review Code  1- Verbalizes Understanding      Exercise & Equipment Safety: - Individual verbal instruction and demonstration of equipment use and safety with use of the equipment.   Pulmonary Rehab from 09/09/2018 in [facility] Cardiac and Pulmonary Rehab  Date  07/21/18  Educator  [facility]  Instruction Review Code  1- Verbalizes Understanding      Infection Prevention: - Provides verbal and written material to individual with discussion of infection control including proper hand washing and proper equipment cleaning during exercise session.   Pulmonary Rehab from 09/09/2018 in [facility] Cardiac and Pulmonary Rehab  Date  07/21/18  Educator  [facility]  Instruction Review Code  1- Verbalizes Understanding      Falls Prevention: - Provides verbal and written material to individual with discussion of falls prevention and safety.   Pulmonary Rehab from 09/09/2018 in [facility] Cardiac and Pulmonary Rehab  Date  07/21/18  Educator  [facility]  Instruction Review Code  1- Verbalizes Understanding      Diabetes: - Individual verbal and written instruction to review signs/symptoms of diabetes, desired ranges of glucose level fasting, after meals and with exercise. Advice that pre and post exercise glucose checks will be done for 3 sessions at entry of program.   Chronic Lung Diseases: - Group  verbal and written instruction to review updates, respiratory medications, advancements in procedures and treatments. Discuss use of supplemental oxygen including available portable oxygen systems, continuous and intermittent flow rates, concentrators, personal use and safety guidelines. Review proper use of inhaler and spacers. Provide informative websites for self-education.    Pulmonary Rehab from 09/09/2018 in [facility] Cardiac and Pulmonary Rehab  Date  08/07/18  Educator  [facility]  Instruction Review Code  1- Verbalizes Understanding      Energy Conservation: - Provide group verbal and written instruction for methods to conserve energy,  plan and organize activities. Instruct on pacing techniques, use of adaptive equipment and posture/positioning to relieve shortness of breath.   Pulmonary Rehab from 09/09/2018 in [facility] Cardiac and Pulmonary Rehab  Date  09/04/18  Educator  [facility]  Instruction Review Code  1- Verbalizes Understanding      Triggers and Exacerbations: - Group verbal and written instruction to review types of environmental triggers and ways to prevent exacerbations. Discuss weather changes, air quality and the benefits of nasal washing. Review warning signs and symptoms to help prevent infections. Discuss techniques for effective airway clearance, coughing, and vibrations.   AED/CPR: - Group verbal and written instruction with the use of models to demonstrate the basic use of the AED with the basic ABC's of resuscitation.   Anatomy and Physiology of the Lungs: - Group verbal and written instruction with the use of models to provide basic lung anatomy and physiology related to function, structure and complications of lung disease.   Pulmonary Rehab from 09/09/2018 in [facility] Cardiac and Pulmonary Rehab  Date  09/09/18  Educator  [facility]  Instruction Review Code  1- Verbalizes Understanding      Anatomy & Physiology of the Heart: - Group verbal and written instruction and models provide basic cardiac anatomy and physiology, with the coronary electrical and arterial systems. Review of Valvular disease and Heart Failure   Cardiac Rehab from 02/14/2016 in [facility] Cardiac and Pulmonary Rehab  Date  12/25/15  Educator  [place]  Instruction Review Code (retired)  2- meets goals/outcomes      Cardiac Medications: - Group verbal and written instruction to review commonly prescribed medications for heart disease. Reviews the medication, class of the drug, and side effects.   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  Discussion of what are risk factors and how they play a role in the disease process.  Review of Cholesterol, Blood Pressure, Diabetes, and BMI and the role they play in your overall health.   Pulmonary Rehab from 09/09/2018 in [facility] Cardiac and Pulmonary Rehab  Date  08/26/18  Educator  [facility]  Instruction Review Code  1- Verbalizes Understanding      Sleep Hygiene: -Provides group verbal and written instruction about how sleep can affect your health.  Define sleep hygiene, discuss sleep cycles and impact of sleep habits. Review good sleep hygiene tips.    Other: -Provides group and verbal instruction on various topics (see comments)    Knowledge Questionnaire Score: Knowledge Questionnaire Score - 07/21/18 1450      Knowledge Questionnaire Score   Pre Score  12/18   reviewed with patient       Core Components/Risk Factors/Patient Goals at Admission: Personal Goals  and Risk Factors at Admission - 07/21/18 1516      Core Components/Risk Factors/Patient Goals on Admission    Weight Management  Yes;Weight Maintenance    Intervention  Weight Management: Develop a combined nutrition and exercise program designed to reach desired caloric intake, while maintaining appropriate intake of nutrient and fiber, sodium and fats, and appropriate energy expenditure required for the weight goal.;Weight Management: Provide education and appropriate resources to help participant work on and attain dietary goals.    Admit Weight  144 lb (65.3 kg)    Goal Weight: Short Term  140 lb (63.5 kg)    Goal Weight: Long Term  140 lb (63.5 kg)    Expected Outcomes  Short Term: Continue to assess and modify interventions until short term weight is achieved;Long Term: Adherence to nutrition and physical activity/exercise program aimed toward attainment of established weight  goal;Weight Maintenance: Understanding of the daily nutrition guidelines, which includes 25-35% calories from fat, 7% or less cal from saturated fats, less than 275m cholesterol, less than 1.5gm of sodium, & 5 or more servings of fruits and vegetables daily;Understanding recommendations for meals to include 15-35% energy as protein, 25-35% energy from fat, 35-60% energy from carbohydrates, less than 2039mof dietary cholesterol, 20-35 gm of total fiber daily;Understanding of distribution of calorie intake throughout the day with the consumption of 4-5 meals/snacks    Improve shortness of breath with ADL's  Yes    Intervention  Provide education, individualized exercise plan and daily activity instruction to help decrease symptoms of SOB with activities of daily living.    Expected Outcomes  Short Term: Improve cardiorespiratory fitness to achieve a reduction of symptoms when performing ADLs;Long Term: Be able to perform more ADLs without symptoms or delay the onset of symptoms    Heart Failure  Yes    Intervention  Provide a combined exercise and nutrition program that is supplemented with education, support and counseling about heart failure. Directed toward relieving symptoms such as shortness of breath, decreased exercise tolerance, and extremity edema.    Expected Outcomes  Improve functional capacity of life;Short term: Attendance in program 2-3 days a week with increased exercise capacity. Reported lower sodium intake. Reported increased fruit and vegetable intake. Reports medication compliance.;Short term: Daily weights obtained and reported for increase. Utilizing diuretic protocols set by physician.;Long term: Adoption of self-care skills and reduction of barriers for early signs and symptoms recognition and intervention leading to self-care maintenance.       Core Components/Risk Factors/Patient Goals Review:  Goals and Risk Factor Review    Row Name 08/28/18 1219 10/12/18 1207 11/03/18 1122          Core Components/Risk Factors/Patient Goals Review   Personal Goals Review  Weight Management/Obesity;Improve shortness of breath with ADL's;Heart Failure;Develop more efficient breathing techniques such as purse lipped breathing and diaphragmatic breathing and practicing self-pacing with activity.  Weight Management/Obesity;Improve shortness of breath with ADL's;Heart Failure  Weight Management/Obesity;Improve shortness of breath with ADL's;Heart Failure     Review  MaAubryns continuing to weight herself daily and keep a log. She is managing her heart failure symptoms well. She has had a productive cough mostly in the evenings, she has made her MDs aware and they tell her its due to age. She has noticed an improvement with her SOB since starting. She wants to keep increasing her strength while here as she can already tell a difference standing up straight with her breathing and her daily activities  MaRonishaas  been doing well at home.  However, her weight and fluid levels have started to increase which is worsening her shortness of breath.  She is working with her doctor about both of these and trying to get them back under control.   Mahogony's weight has continued to go up as she is fluid overloaded.  She is working with her doctors about this. She is going to heart failure clinic today for IV lasix for some hopeful relief.  Between the fluid making her legs heavy and laboring her breathing, she has not been able to do much at home.  She wants to feel better.       Expected Outcomes  Short: Suriya will continue to come to [facility] to work on strength and breathing with ADLs. Long: Tereza will maintain her strength and stand up right while doing activities and sitting.   Short: Continue to work with doctor on heart failure. Long: Continue to manage heart failure.   -        Core Components/Risk Factors/Patient Goals at Discharge (Final Review):  Goals and Risk Factor Review - 11/03/18 1122       Core Components/Risk Factors/Patient Goals Review   Personal Goals Review  Weight Management/Obesity;Improve shortness of breath with ADL's;Heart Failure    Review  Evalin's weight has continued to go up as she is fluid overloaded.  She is working with her doctors about this. She is going to heart failure clinic today for IV lasix for some hopeful relief.  Between the fluid making her legs heavy and laboring her breathing, she has not been able to do much at home.  She wants to feel better.         ITP Comments: ITP Comments    Row Name 07/21/18 1429 08/10/18 0843 09/07/18 0825 09/21/18 0936 10/05/18 0942   ITP Comments  Medical Evaluation completed. Chart sent for review and changes to Dr. [name] Director of [place]. Diagnosis can be found in [facility] encounter 07/09/2018  30 day review completed. ITP sent to Dr. [name] Director of [place]. Continue with ITP unless changes are made by physician.  30 day review completed. ITP sent to Dr. [name] Director of [place]. Continue with ITP unless changes are made by physician.  Our program is currently closed due to COVID-19.  We are communicating with patient via phone calls and emails.  30 day review completed. ITP sent to Dr. [name] for review,changes as needed and signature. Continue with ITP unless changes directed by Dr. Sabra Heck.    [place] Name 01/12/19 1218           ITP Comments  Pt still currently admitted and was trasnferred to [facility].  We will discharge her at this time as she will need PT again prior to returning to rehab.          Comments: Discharge ITP

## 2019-01-12 NOTE — Progress Notes (Signed)
Discharge Progress Report  Patient Details  Name: [name] MRN: [id number] Date of Birth: 11-Nov-1934 Referring Provider:     Pulmonary Rehab from 07/21/2018 in [place] [facility]  Referring Provider  [name] MD       Number of Visits: 20/36  Reason for Discharge:  Early Exit:  Readmissions  Smoking History:  Social History   Tobacco Use  Smoking Status Former Smoker  . Packs/day: 1.00  . Years: 27.00  . Pack years: 27.00  . Types: Cigarettes  . Quit date: 07/01/1980  . Years since quitting: 38.5  Smokeless Tobacco Never Used  Tobacco Comment   "quit smoking ~ 1980    Diagnosis:  Chronic respiratory failure with hypoxia, on home O2 therapy (HCC)  ADL UCSD: Pulmonary Assessment Scores    Row Name 07/21/18 1451         ADL UCSD   ADL Phase  Entry     SOB Score total  100     Rest  3     Walk  3     Stairs  4     Bath  5     Dress  5     Shop  5       CAT Score   CAT Score  24       mMRC Score   mMRC Score  4        Initial Exercise Prescription: Initial Exercise Prescription - 07/21/18 1500      Date of Initial Exercise RX and Referring Provider   Date  07/21/18    Referring Provider  [name] MD      Oxygen   Oxygen  Continuous    Liters  2      T5 Nustep   Level  1    SPM  80    Minutes  15    METs  1.5      Biostep-RELP   Level  1    SPM  50    Minutes  15    METs  1      Track   Laps  11    Minutes  15    METs  1.5      Prescription Details   Frequency (times per week)  3    Duration  Progress to 45 minutes of aerobic exercise without signs/symptoms of physical distress      Intensity   THRR 40-80% of Max Heartrate  99-126    Ratings of Perceived Exertion  11-13    Perceived Dyspnea  0-4      Progression   Progression  Continue to progress workloads to maintain intensity without signs/symptoms of physical distress.      Resistance Training   Training Prescription  Yes    Weight  3 lbs     Reps  10-15       Discharge Exercise Prescription (Final Exercise Prescription Changes): Exercise Prescription Changes - 09/23/18 1500      Response to Exercise   Blood Pressure (Admit)  112/60    Blood Pressure (Exit)  104/62    Heart Rate (Admit)  80 bpm    Heart Rate (Exercise)  78 bpm    Heart Rate (Exit)  63 bpm    Oxygen Saturation (Admit)  93 %    Oxygen Saturation (Exercise)  86 %    Oxygen Saturation (Exit)  93 %    Rating of Perceived Exertion (Exercise)  15  Perceived Dyspnea (Exercise)  3    Symptoms  none    Duration  Continue with 45 min of aerobic exercise without signs/symptoms of physical distress.    Intensity  THRR unchanged      Progression   Progression  Continue to progress workloads to maintain intensity without signs/symptoms of physical distress.    Average METs  1.8      Resistance Training   Training Prescription  Yes    Weight  ROM    Reps  10-15      Interval Training   Interval Training  No      Oxygen   Oxygen  Continuous    Liters  3      T5 Nustep   Level  2    Minutes  15    METs  1.8      Biostep-RELP   Level  3    Minutes  15    METs  2      Track   Laps  13    Minutes  15    METs  1.6      Home Exercise Plan   Plans to continue exercise at  Home (comment)   walking, PT exercises   Frequency  Add 2 additional days to program exercise sessions.    Initial Home Exercises Provided  08/12/18       Functional Capacity: 6 Minute Walk    Row Name 07/21/18 1517         6 Minute Walk   Phase  Initial     Distance  580 feet     Walk Time  6 minutes     # of Rest Breaks  0     MPH  1.09     METS  1.01     RPE  11     Perceived Dyspnea   3     VO2 Peak  3.54     Symptoms  Yes (comment)     Comments  chronic back pain     Resting HR  72 bpm     Resting BP  126/62     Resting Oxygen Saturation   94 %     Exercise Oxygen Saturation  during 6 min walk  80 %     Max Ex. HR  83 bpm     Max Ex. BP  164/74     2  Minute Post BP  136/64       Interval HR   1 Minute HR  72     2 Minute HR  76     3 Minute HR  79     4 Minute HR  69     5 Minute HR  71     6 Minute HR  83     2 Minute Post HR  73     Interval Heart Rate?  Yes       Interval Oxygen   Interval Oxygen?  Yes     Baseline Oxygen Saturation %  94 %     1 Minute Oxygen Saturation %  88 %     1 Minute Liters of Oxygen  2 L continuous     2 Minute Oxygen Saturation %  83 %     2 Minute Liters of Oxygen  2 L     3 Minute Oxygen Saturation %  82 %     3 Minute Liters of Oxygen  2 L  4 Minute Oxygen Saturation %  82 %     4 Minute Liters of Oxygen  2 L     5 Minute Oxygen Saturation %  80 %     5 Minute Liters of Oxygen  2 L     6 Minute Oxygen Saturation %  87 %     6 Minute Liters of Oxygen  2 L     2 Minute Post Oxygen Saturation %  98 %     2 Minute Post Liters of Oxygen  2 L        Psychological, QOL, Others - Outcomes: PHQ 2/9: Depression screen [facility] 2/9 09/09/2018 08/12/2018 07/27/2018 07/21/2018 10/27/2017  Decreased Interest 0 1 0 1 0  Down, Depressed, Hopeless 1 1 0 2 0  PHQ - 2 Score 1 2 0 3 0  Altered sleeping 2 3 3 3  -  Tired, decreased energy 1 3 2 2  -  Change in appetite 1 1 3 3  -  Feeling bad or failure about yourself  0 0 1 2 -  Trouble concentrating 1 1 2 2  -  Moving slowly or fidgety/restless 0 0 1 1 -  Suicidal thoughts 0 0 0 0 -  PHQ-9 Score 6 10 12 16  -  Difficult doing work/chores Somewhat difficult Somewhat difficult Somewhat difficult Very difficult -  Some recent data might be hidden    Quality of Life:   Personal Goals: Goals established at orientation with interventions provided to work toward goal. Personal Goals and Risk Factors at Admission - 07/21/18 1516      Core Components/Risk Factors/Patient Goals on Admission    Weight Management  Yes;Weight Maintenance    Intervention  Weight Management: Develop a combined nutrition and exercise program designed to reach desired caloric intake,  while maintaining appropriate intake of nutrient and fiber, sodium and fats, and appropriate energy expenditure required for the weight goal.;Weight Management: Provide education and appropriate resources to help participant work on and attain dietary goals.    Admit Weight  144 lb (65.3 kg)    Goal Weight: Short Term  140 lb (63.5 kg)    Goal Weight: Long Term  140 lb (63.5 kg)    Expected Outcomes  Short Term: Continue to assess and modify interventions until short term weight is achieved;Long Term: Adherence to nutrition and physical activity/exercise program aimed toward attainment of established weight goal;Weight Maintenance: Understanding of the daily nutrition guidelines, which includes 25-35% calories from fat, 7% or less cal from saturated fats, less than 200mg  cholesterol, less than 1.5gm of sodium, & 5 or more servings of fruits and vegetables daily;Understanding recommendations for meals to include 15-35% energy as protein, 25-35% energy from fat, 35-60% energy from carbohydrates, less than 200mg  of dietary cholesterol, 20-35 gm of total fiber daily;Understanding of distribution of calorie intake throughout the day with the consumption of 4-5 meals/snacks    Improve shortness of breath with ADL's  Yes    Intervention  Provide education, individualized exercise plan and daily activity instruction to help decrease symptoms of SOB with activities of daily living.    Expected Outcomes  Short Term: Improve cardiorespiratory fitness to achieve a reduction of symptoms when performing ADLs;Long Term: Be able to perform more ADLs without symptoms or delay the onset of symptoms    Heart Failure  Yes    Intervention  Provide a combined exercise and nutrition program that is supplemented with education, support and counseling about heart failure. Directed toward relieving symptoms such  as shortness of breath, decreased exercise tolerance, and extremity edema.    Expected Outcomes  Improve functional  capacity of life;Short term: Attendance in program 2-3 days a week with increased exercise capacity. Reported lower sodium intake. Reported increased fruit and vegetable intake. Reports medication compliance.;Short term: Daily weights obtained and reported for increase. Utilizing diuretic protocols set by physician.;Long term: Adoption of self-care skills and reduction of barriers for early signs and symptoms recognition and intervention leading to self-care maintenance.        Personal Goals Discharge: Goals and Risk Factor Review    Row Name 08/28/18 1219 10/12/18 1207 11/03/18 1122         Core Components/Risk Factors/Patient Goals Review   Personal Goals Review  Weight Management/Obesity;Improve shortness of breath with ADL's;Heart Failure;Develop more efficient breathing techniques such as purse lipped breathing and diaphragmatic breathing and practicing self-pacing with activity.  Weight Management/Obesity;Improve shortness of breath with ADL's;Heart Failure  Weight Management/Obesity;Improve shortness of breath with ADL's;Heart Failure     Review  [name] is continuing to weight herself daily and keep a log. She is managing her heart failure symptoms well. She has had a productive cough mostly in the evenings, she has made her MDs aware and they tell her its due to age. She has noticed an improvement with her SOB since starting. She wants to keep increasing her strength while here as she can already tell a difference standing up straight with her breathing and her daily activities  [name] has been doing well at home.  However, her weight and fluid levels have started to increase which is worsening her shortness of breath.  She is working with her doctor about both of these and trying to get them back under control.   [name]'s weight has continued to go up as she is fluid overloaded.  She is working with her doctors about this. She is going to heart failure clinic today for IV lasix for some  hopeful relief.  Between the fluid making her legs heavy and laboring her breathing, she has not been able to do much at home.  She wants to feel better.       Expected Outcomes  Short: [name] will continue to come to LW to work on strength and breathing with ADLs. Long: [name] will maintain her strength and stand up right while doing activities and sitting.   Short: Continue to work with doctor on heart failure. Long: Continue to manage heart failure.   -        Exercise Goals and Review: Exercise Goals    Row Name 07/21/18 1524             Exercise Goals   Increase Physical Activity  Yes       Intervention  Provide advice, education, support and counseling about physical activity/exercise needs.;Develop an individualized exercise prescription for aerobic and resistive training based on initial evaluation findings, risk stratification, comorbidities and participant's personal goals.       Expected Outcomes  Short Term: Attend rehab on a regular basis to increase amount of physical activity.;Long Term: Add in home exercise to make exercise part of routine and to increase amount of physical activity.;Long Term: Exercising regularly at least 3-5 days a week.       Increase Strength and Stamina  Yes       Intervention  Develop an individualized exercise prescription for aerobic and resistive training based on initial evaluation findings, risk stratification, comorbidities and participant's personal goals.;Provide advice,  education, support and counseling about physical activity/exercise needs.       Expected Outcomes  Short Term: Increase workloads from initial exercise prescription for resistance, speed, and METs.;Long Term: Improve cardiorespiratory fitness, muscular endurance and strength as measured by increased METs and functional capacity (6MWT);Short Term: Perform resistance training exercises routinely during rehab and add in resistance training at home       Able to understand and use  rate of perceived exertion (RPE) scale  Yes       Intervention  Provide education and explanation on how to use RPE scale       Expected Outcomes  Short Term: Able to use RPE daily in rehab to express subjective intensity level;Long Term:  Able to use RPE to guide intensity level when exercising independently       Able to understand and use Dyspnea scale  Yes       Intervention  Provide education and explanation on how to use Dyspnea scale       Expected Outcomes  Short Term: Able to use Dyspnea scale daily in rehab to express subjective sense of shortness of breath during exertion;Long Term: Able to use Dyspnea scale to guide intensity level when exercising independently       Knowledge and understanding of Target Heart Rate Range (THRR)  Yes       Intervention  Provide education and explanation of THRR including how the numbers were predicted and where they are located for reference       Expected Outcomes  Short Term: Able to state/look up THRR;Short Term: Able to use daily as guideline for intensity in rehab;Long Term: Able to use THRR to govern intensity when exercising independently       Able to check pulse independently  Yes       Intervention  Provide education and demonstration on how to check pulse in carotid and radial arteries.;Review the importance of being able to check your own pulse for safety during independent exercise       Expected Outcomes  Short Term: Able to explain why pulse checking is important during independent exercise;Long Term: Able to check pulse independently and accurately       Understanding of Exercise Prescription  Yes       Intervention  Provide education, explanation, and written materials on patient's individual exercise prescription       Expected Outcomes  Short Term: Able to explain program exercise prescription;Long Term: Able to explain home exercise prescription to exercise independently          Exercise Goals Re-Evaluation: Exercise Goals  Re-Evaluation    Row Name 07/27/18 1251 08/11/18 1257 08/12/18 1230 08/25/18 1455 09/07/18 1623     Exercise Goal Re-Evaluation   Exercise Goals Review  Increase Physical Activity;Able to understand and use rate of perceived exertion (RPE) scale;Increase Strength and Stamina;Able to understand and use Dyspnea scale  Increase Physical Activity;Increase Strength and Stamina;Understanding of Exercise Prescription  Increase Physical Activity;Able to understand and use rate of perceived exertion (RPE) scale;Knowledge and understanding of Target Heart Rate Range (THRR);Understanding of Exercise Prescription;Increase Strength and Stamina;Able to understand and use Dyspnea scale;Able to check pulse independently  Increase Physical Activity;Increase Strength and Stamina;Understanding of Exercise Prescription  Increase Physical Activity;Increase Strength and Stamina;Understanding of Exercise Prescription   Comments  Reviewed RPE scale, THR and program prescription with pt today.  Pt voiced understanding and was given a copy of goals to take home  [name] is off to a good  start in rehab.  She is already up to 13 laps on the track.  She is not using weight due to her neck and spine, as they aggravated more so.  We will be reviewing home exercise guidelines soon.  We will continue to monitor her progress.   Reviewed home exercise with pt today.  Pt plans to walk and do PT exercises at home for exercise.  Reviewed THR, pulse, RPE, sign and symptoms, and when to call 911 or MD.  Also discussed weather considerations and indoor options.  Pt voiced understanding.  [name] has been doing well in rehab.  She is up to level 2 on BioStep.  She is ready to increase her T5 NuStep.  We will continue to monitor her progess.   [name] continues to do well in rehab.  She is moving better overall.  She is up to 17 laps on the track.  We will continue to monitor her progress.    Expected Outcomes  Short: Use RPE daily to regulate  intensity. Long: Follow program prescription in THR.  Short: Review home exercise guidelines.  Long: Continue to follow program prescription.   Short: Start to add in walking at home.  Long: Continue to increase activity levels  Short: Increase T5 NuStep.  Long: Continue to increase strength and stamina.  Short: Continue to encourage more movement.  Long: Continue to increase strength and stamina.    [name] Name 09/23/18 1516 10/12/18 1201 11/03/18 1109         Exercise Goal Re-Evaluation   Exercise Goals Review  Increase Physical Activity;Increase Strength and Stamina;Understanding of Exercise Prescription  Increase Physical Activity;Increase Strength and Stamina;Understanding of Exercise Prescription  Increase Physical Activity;Increase Strength and Stamina;Understanding of Exercise Prescription     Comments  [name] has been doing well in rehab. She has tried her best to maintain.  She should be walking at home and has access to our videos as well. We will continue to monitor her progress at home.   [name] has been doing some of her PT exercises at home.  She has not been getting our emails but will check again for other options.   [name] has not been able to exercise.  She is still struggling with her breathing and not feeling up to doing much.  She has a lot of fluid on board and is working with her doctors.      Expected Outcomes  Short: Continue to move more at home.  Long: Continue to increase activity levels.   Short: Continue to move as much as she can.  Long: Continue to maintain stamina.   Short: Try to do some PT exercises to maintain strength.  Long: Continue to work on activity.         Nutrition & Weight - Outcomes: Pre Biometrics - 07/21/18 1525      Pre Biometrics   Height  5' 1.6" (1.565 m)    Weight  144 lb 4.8 oz (65.5 kg)    Waist Circumference  36 inches    Hip Circumference  38 inches    Waist to Hip Ratio  0.95 %    BMI (Calculated)  26.72    Single Leg Stand  1.88  seconds        Nutrition: Nutrition Therapy & Goals - 09/09/18 1240      Nutrition Therapy   Fiber  20 grams    Whole Grain Foods  3 servings    Fruits and Vegetables  5 servings/day  Sodium  1.2 grams   Pt states per MD     Personal Nutrition Goals   Nutrition Goal  ST: have an easier time eating low sodium and enjoying it LT: get to down to sodium goal    Comments  Talked about how she is reading her labels and not eating foods she likes; talked about choosing lower salt foods and how to improve flavor. Showed pt how to read label using sample.       Intervention Plan   Intervention  Prescribe, educate and counsel regarding individualized specific dietary modifications aiming towards targeted core components such as weight, hypertension, lipid management, diabetes, heart failure and other comorbidities.    Expected Outcomes  Long Term Goal: Adherence to prescribed nutrition plan.;Short Term Goal: Understand basic principles of dietary content, such as calories, fat, sodium, cholesterol and nutrients.       Nutrition Discharge:   Education Questionnaire Score: Knowledge Questionnaire Score - 07/21/18 1450      Knowledge Questionnaire Score   Pre Score  12/18   reviewed with patient      Goals reviewed with patient; copy given to patient.

## 2019-01-13 MED ORDER — GENERIC EXTERNAL MEDICATION
10.00 | Status: DC
Start: ? — End: 2019-01-13

## 2019-01-13 MED ORDER — WARFARIN SODIUM 2 MG PO TABS
2.00 | ORAL_TABLET | ORAL | Status: DC
Start: 2019-01-13 — End: 2019-01-13

## 2019-01-16 ENCOUNTER — Inpatient Hospital Stay: Admit: 2019-01-16 | Payer: Medicare Other | Admitting: Internal Medicine

## 2019-01-16 ENCOUNTER — Inpatient Hospital Stay
Admission: AD | Admit: 2019-01-16 | Payer: Medicare Other | Source: Other Acute Inpatient Hospital | Admitting: Internal Medicine

## 2019-01-18 ENCOUNTER — Inpatient Hospital Stay
Admission: AD | Admit: 2019-01-18 | Discharge: 2019-03-02 | DRG: 901 | Disposition: E | Payer: Medicare Other | Source: Other Acute Inpatient Hospital | Attending: Pulmonary Disease | Admitting: Pulmonary Disease

## 2019-01-18 DIAGNOSIS — Y838 Other surgical procedures as the cause of abnormal reaction of the patient, or of later complication, without mention of misadventure at the time of the procedure: Secondary | ICD-10-CM | POA: Diagnosis present

## 2019-01-18 DIAGNOSIS — G9341 Metabolic encephalopathy: Secondary | ICD-10-CM | POA: Diagnosis not present

## 2019-01-18 DIAGNOSIS — N183 Chronic kidney disease, stage 3 (moderate): Secondary | ICD-10-CM | POA: Diagnosis present

## 2019-01-18 DIAGNOSIS — M19042 Primary osteoarthritis, left hand: Secondary | ICD-10-CM | POA: Diagnosis present

## 2019-01-18 DIAGNOSIS — I471 Supraventricular tachycardia: Secondary | ICD-10-CM | POA: Diagnosis not present

## 2019-01-18 DIAGNOSIS — I255 Ischemic cardiomyopathy: Secondary | ICD-10-CM | POA: Diagnosis present

## 2019-01-18 DIAGNOSIS — Z79891 Long term (current) use of opiate analgesic: Secondary | ICD-10-CM

## 2019-01-18 DIAGNOSIS — S8012XD Contusion of left lower leg, subsequent encounter: Secondary | ICD-10-CM

## 2019-01-18 DIAGNOSIS — I509 Heart failure, unspecified: Secondary | ICD-10-CM | POA: Diagnosis not present

## 2019-01-18 DIAGNOSIS — T17908A Unspecified foreign body in respiratory tract, part unspecified causing other injury, initial encounter: Secondary | ICD-10-CM

## 2019-01-18 DIAGNOSIS — M80051D Age-related osteoporosis with current pathological fracture, right femur, subsequent encounter for fracture with routine healing: Secondary | ICD-10-CM | POA: Diagnosis not present

## 2019-01-18 DIAGNOSIS — I4819 Other persistent atrial fibrillation: Secondary | ICD-10-CM | POA: Diagnosis present

## 2019-01-18 DIAGNOSIS — T45515A Adverse effect of anticoagulants, initial encounter: Secondary | ICD-10-CM | POA: Diagnosis present

## 2019-01-18 DIAGNOSIS — I890 Lymphedema, not elsewhere classified: Secondary | ICD-10-CM | POA: Diagnosis present

## 2019-01-18 DIAGNOSIS — I5020 Unspecified systolic (congestive) heart failure: Secondary | ICD-10-CM | POA: Diagnosis not present

## 2019-01-18 DIAGNOSIS — I5022 Chronic systolic (congestive) heart failure: Secondary | ICD-10-CM

## 2019-01-18 DIAGNOSIS — I4891 Unspecified atrial fibrillation: Secondary | ICD-10-CM | POA: Diagnosis not present

## 2019-01-18 DIAGNOSIS — R0602 Shortness of breath: Secondary | ICD-10-CM | POA: Diagnosis not present

## 2019-01-18 DIAGNOSIS — R64 Cachexia: Secondary | ICD-10-CM | POA: Diagnosis present

## 2019-01-18 DIAGNOSIS — Z20828 Contact with and (suspected) exposure to other viral communicable diseases: Secondary | ICD-10-CM | POA: Diagnosis present

## 2019-01-18 DIAGNOSIS — K5900 Constipation, unspecified: Secondary | ICD-10-CM | POA: Diagnosis not present

## 2019-01-18 DIAGNOSIS — G92 Toxic encephalopathy: Secondary | ICD-10-CM | POA: Diagnosis not present

## 2019-01-18 DIAGNOSIS — R0600 Dyspnea, unspecified: Secondary | ICD-10-CM

## 2019-01-18 DIAGNOSIS — L89154 Pressure ulcer of sacral region, stage 4: Secondary | ICD-10-CM | POA: Diagnosis not present

## 2019-01-18 DIAGNOSIS — S72009A Fracture of unspecified part of neck of unspecified femur, initial encounter for closed fracture: Secondary | ICD-10-CM | POA: Diagnosis present

## 2019-01-18 DIAGNOSIS — D689 Coagulation defect, unspecified: Secondary | ICD-10-CM | POA: Diagnosis present

## 2019-01-18 DIAGNOSIS — M96840 Postprocedural hematoma of a musculoskeletal structure following a musculoskeletal system procedure: Secondary | ICD-10-CM | POA: Diagnosis present

## 2019-01-18 DIAGNOSIS — M549 Dorsalgia, unspecified: Secondary | ICD-10-CM | POA: Diagnosis present

## 2019-01-18 DIAGNOSIS — Z841 Family history of disorders of kidney and ureter: Secondary | ICD-10-CM

## 2019-01-18 DIAGNOSIS — M810 Age-related osteoporosis without current pathological fracture: Secondary | ICD-10-CM | POA: Diagnosis present

## 2019-01-18 DIAGNOSIS — L98429 Non-pressure chronic ulcer of back with unspecified severity: Secondary | ICD-10-CM | POA: Diagnosis not present

## 2019-01-18 DIAGNOSIS — E876 Hypokalemia: Secondary | ICD-10-CM | POA: Diagnosis not present

## 2019-01-18 DIAGNOSIS — I480 Paroxysmal atrial fibrillation: Secondary | ICD-10-CM

## 2019-01-18 DIAGNOSIS — Z885 Allergy status to narcotic agent status: Secondary | ICD-10-CM

## 2019-01-18 DIAGNOSIS — J9601 Acute respiratory failure with hypoxia: Secondary | ICD-10-CM | POA: Diagnosis not present

## 2019-01-18 DIAGNOSIS — S72141D Displaced intertrochanteric fracture of right femur, subsequent encounter for closed fracture with routine healing: Secondary | ICD-10-CM

## 2019-01-18 DIAGNOSIS — G8929 Other chronic pain: Secondary | ICD-10-CM | POA: Diagnosis present

## 2019-01-18 DIAGNOSIS — Z7901 Long term (current) use of anticoagulants: Secondary | ICD-10-CM | POA: Diagnosis not present

## 2019-01-18 DIAGNOSIS — D62 Acute posthemorrhagic anemia: Secondary | ICD-10-CM | POA: Diagnosis present

## 2019-01-18 DIAGNOSIS — N189 Chronic kidney disease, unspecified: Secondary | ICD-10-CM | POA: Diagnosis not present

## 2019-01-18 DIAGNOSIS — N179 Acute kidney failure, unspecified: Secondary | ICD-10-CM | POA: Diagnosis not present

## 2019-01-18 DIAGNOSIS — Z79890 Hormone replacement therapy (postmenopausal): Secondary | ICD-10-CM

## 2019-01-18 DIAGNOSIS — Z515 Encounter for palliative care: Secondary | ICD-10-CM | POA: Diagnosis not present

## 2019-01-18 DIAGNOSIS — Z66 Do not resuscitate: Secondary | ICD-10-CM | POA: Diagnosis present

## 2019-01-18 DIAGNOSIS — Z881 Allergy status to other antibiotic agents status: Secondary | ICD-10-CM

## 2019-01-18 DIAGNOSIS — S81811A Laceration without foreign body, right lower leg, initial encounter: Secondary | ICD-10-CM | POA: Diagnosis present

## 2019-01-18 DIAGNOSIS — J449 Chronic obstructive pulmonary disease, unspecified: Secondary | ICD-10-CM | POA: Diagnosis present

## 2019-01-18 DIAGNOSIS — L89610 Pressure ulcer of right heel, unstageable: Secondary | ICD-10-CM | POA: Diagnosis not present

## 2019-01-18 DIAGNOSIS — Z961 Presence of intraocular lens: Secondary | ICD-10-CM | POA: Diagnosis present

## 2019-01-18 DIAGNOSIS — I130 Hypertensive heart and chronic kidney disease with heart failure and stage 1 through stage 4 chronic kidney disease, or unspecified chronic kidney disease: Secondary | ICD-10-CM | POA: Diagnosis present

## 2019-01-18 DIAGNOSIS — L89159 Pressure ulcer of sacral region, unspecified stage: Secondary | ICD-10-CM | POA: Diagnosis not present

## 2019-01-18 DIAGNOSIS — S72001S Fracture of unspecified part of neck of right femur, sequela: Secondary | ICD-10-CM | POA: Diagnosis not present

## 2019-01-18 DIAGNOSIS — W19XXXD Unspecified fall, subsequent encounter: Secondary | ICD-10-CM | POA: Diagnosis present

## 2019-01-18 DIAGNOSIS — L89153 Pressure ulcer of sacral region, stage 3: Secondary | ICD-10-CM | POA: Diagnosis not present

## 2019-01-18 DIAGNOSIS — Z880 Allergy status to penicillin: Secondary | ICD-10-CM

## 2019-01-18 DIAGNOSIS — I082 Rheumatic disorders of both aortic and tricuspid valves: Secondary | ICD-10-CM | POA: Diagnosis present

## 2019-01-18 DIAGNOSIS — R000 Tachycardia, unspecified: Secondary | ICD-10-CM | POA: Diagnosis not present

## 2019-01-18 DIAGNOSIS — Z8781 Personal history of (healed) traumatic fracture: Secondary | ICD-10-CM | POA: Diagnosis not present

## 2019-01-18 DIAGNOSIS — R0902 Hypoxemia: Secondary | ICD-10-CM | POA: Diagnosis not present

## 2019-01-18 DIAGNOSIS — R54 Age-related physical debility: Secondary | ICD-10-CM | POA: Diagnosis present

## 2019-01-18 DIAGNOSIS — E43 Unspecified severe protein-calorie malnutrition: Secondary | ICD-10-CM | POA: Diagnosis present

## 2019-01-18 DIAGNOSIS — N3000 Acute cystitis without hematuria: Secondary | ICD-10-CM | POA: Diagnosis not present

## 2019-01-18 DIAGNOSIS — Z978 Presence of other specified devices: Secondary | ICD-10-CM | POA: Diagnosis not present

## 2019-01-18 DIAGNOSIS — Z952 Presence of prosthetic heart valve: Secondary | ICD-10-CM | POA: Diagnosis not present

## 2019-01-18 DIAGNOSIS — Z950 Presence of cardiac pacemaker: Secondary | ICD-10-CM | POA: Diagnosis not present

## 2019-01-18 DIAGNOSIS — Z79899 Other long term (current) drug therapy: Secondary | ICD-10-CM

## 2019-01-18 DIAGNOSIS — J9621 Acute and chronic respiratory failure with hypoxia: Secondary | ICD-10-CM | POA: Diagnosis not present

## 2019-01-18 DIAGNOSIS — J810 Acute pulmonary edema: Secondary | ICD-10-CM | POA: Diagnosis not present

## 2019-01-18 DIAGNOSIS — B952 Enterococcus as the cause of diseases classified elsewhere: Secondary | ICD-10-CM | POA: Diagnosis not present

## 2019-01-18 DIAGNOSIS — I420 Dilated cardiomyopathy: Secondary | ICD-10-CM | POA: Diagnosis present

## 2019-01-18 DIAGNOSIS — Z6824 Body mass index (BMI) 24.0-24.9, adult: Secondary | ICD-10-CM | POA: Diagnosis not present

## 2019-01-18 DIAGNOSIS — I272 Pulmonary hypertension, unspecified: Secondary | ICD-10-CM | POA: Diagnosis present

## 2019-01-18 DIAGNOSIS — I5043 Acute on chronic combined systolic (congestive) and diastolic (congestive) heart failure: Secondary | ICD-10-CM | POA: Diagnosis not present

## 2019-01-18 DIAGNOSIS — Z9841 Cataract extraction status, right eye: Secondary | ICD-10-CM

## 2019-01-18 DIAGNOSIS — Z823 Family history of stroke: Secondary | ICD-10-CM

## 2019-01-18 DIAGNOSIS — Z87891 Personal history of nicotine dependence: Secondary | ICD-10-CM

## 2019-01-18 DIAGNOSIS — Z888 Allergy status to other drugs, medicaments and biological substances status: Secondary | ICD-10-CM

## 2019-01-18 DIAGNOSIS — G479 Sleep disorder, unspecified: Secondary | ICD-10-CM | POA: Diagnosis not present

## 2019-01-18 DIAGNOSIS — Z9981 Dependence on supplemental oxygen: Secondary | ICD-10-CM | POA: Diagnosis not present

## 2019-01-18 DIAGNOSIS — K219 Gastro-esophageal reflux disease without esophagitis: Secondary | ICD-10-CM | POA: Diagnosis present

## 2019-01-18 DIAGNOSIS — Z960 Presence of urogenital implants: Secondary | ICD-10-CM | POA: Diagnosis not present

## 2019-01-18 DIAGNOSIS — M170 Bilateral primary osteoarthritis of knee: Secondary | ICD-10-CM | POA: Diagnosis present

## 2019-01-18 DIAGNOSIS — X58XXXA Exposure to other specified factors, initial encounter: Secondary | ICD-10-CM | POA: Diagnosis present

## 2019-01-18 DIAGNOSIS — I5023 Acute on chronic systolic (congestive) heart failure: Secondary | ICD-10-CM | POA: Diagnosis not present

## 2019-01-18 DIAGNOSIS — M19041 Primary osteoarthritis, right hand: Secondary | ICD-10-CM | POA: Diagnosis present

## 2019-01-18 DIAGNOSIS — L89152 Pressure ulcer of sacral region, stage 2: Secondary | ICD-10-CM | POA: Diagnosis present

## 2019-01-18 DIAGNOSIS — I447 Left bundle-branch block, unspecified: Secondary | ICD-10-CM | POA: Diagnosis present

## 2019-01-18 DIAGNOSIS — D649 Anemia, unspecified: Secondary | ICD-10-CM | POA: Diagnosis not present

## 2019-01-18 DIAGNOSIS — D631 Anemia in chronic kidney disease: Secondary | ICD-10-CM | POA: Diagnosis present

## 2019-01-18 DIAGNOSIS — Z7401 Bed confinement status: Secondary | ICD-10-CM

## 2019-01-18 MED ORDER — IPRATROPIUM-ALBUTEROL 0.5-2.5 (3) MG/3ML IN SOLN: 3.0000 mL | Freq: Four times a day (QID) | RESPIRATORY_TRACT | Status: DC | PRN

## 2019-01-18 MED ORDER — ONDANSETRON HCL 4 MG/2ML IJ SOLN: 4.0000 mg | Freq: Four times a day (QID) | INTRAMUSCULAR | Status: DC | PRN

## 2019-01-18 MED ORDER — ACETAMINOPHEN 650 MG RE SUPP: 650.0000 mg | Freq: Four times a day (QID) | RECTAL | Status: DC | PRN

## 2019-01-18 MED ORDER — WARFARIN SODIUM 2.5 MG PO TABS
2.50 | ORAL_TABLET | ORAL | Status: DC
Start: 2019-01-18 — End: 2019-01-18

## 2019-01-18 MED ORDER — ONDANSETRON HCL 4 MG/2ML IJ SOLN
4.0000 mg | Freq: Four times a day (QID) | INTRAMUSCULAR | Status: DC | PRN
  Administered 2019-02-12 – 2019-02-13 (×3): 4 mg via INTRAVENOUS
  Filled 2019-01-18 (×3): qty 2

## 2019-01-18 MED ORDER — VITAMIN C 500 MG PO TABS
500.0000 mg | ORAL_TABLET | Freq: Every day | ORAL | Status: DC
  Administered 2019-01-19: 500 mg via ORAL
  Filled 2019-01-18: qty 1

## 2019-01-18 MED ORDER — MENTHOL 3 MG MT LOZG
1.0000 | LOZENGE | OROMUCOSAL | Status: DC | PRN
  Filled 2019-01-18: qty 9

## 2019-01-18 MED ORDER — GENERIC EXTERNAL MEDICATION
1.00 | Status: DC
Start: 2019-01-18 — End: 2019-01-18

## 2019-01-18 MED ORDER — ONDANSETRON HCL 4 MG PO TABS
4.0000 mg | ORAL_TABLET | Freq: Four times a day (QID) | ORAL | Status: DC | PRN
  Administered 2019-01-19 – 2019-02-12 (×2): 4 mg via ORAL
  Filled 2019-01-18 (×2): qty 1

## 2019-01-18 MED ORDER — METOCLOPRAMIDE HCL 5 MG/ML IJ SOLN: 5.0000 mg | Freq: Three times a day (TID) | INTRAMUSCULAR | Status: DC | PRN

## 2019-01-18 MED ORDER — OXYCODONE HCL 5 MG PO TABS
2.50 | ORAL_TABLET | ORAL | Status: DC
Start: ? — End: 2019-01-18

## 2019-01-18 MED ORDER — AMIODARONE HCL 200 MG PO TABS
200.0000 mg | ORAL_TABLET | Freq: Every day | ORAL | Status: DC
  Administered 2019-01-19 – 2019-02-02 (×14): 200 mg via ORAL
  Filled 2019-01-18 (×14): qty 1

## 2019-01-18 MED ORDER — ACETAMINOPHEN 325 MG PO TABS
650.0000 mg | ORAL_TABLET | Freq: Four times a day (QID) | ORAL | Status: DC | PRN
  Administered 2019-01-19 – 2019-01-27 (×16): 650 mg via ORAL
  Administered 2019-01-27: 325 mg via ORAL
  Administered 2019-01-28 – 2019-02-12 (×24): 650 mg via ORAL
  Filled 2019-01-18 (×41): qty 2

## 2019-01-18 MED ORDER — METOCLOPRAMIDE HCL 10 MG PO TABS
5.0000 mg | ORAL_TABLET | Freq: Three times a day (TID) | ORAL | Status: DC | PRN
  Filled 2019-01-18: qty 1

## 2019-01-18 MED ORDER — ACETAMINOPHEN 500 MG PO TABS
500.0000 mg | ORAL_TABLET | Freq: Four times a day (QID) | ORAL | Status: AC
  Administered 2019-01-18 – 2019-01-19 (×3): 500 mg via ORAL
  Filled 2019-01-18 (×3): qty 1

## 2019-01-18 MED ORDER — SODIUM CHLORIDE 0.9% FLUSH
3.0000 mL | Freq: Two times a day (BID) | INTRAVENOUS | Status: DC
  Administered 2019-01-18 – 2019-02-14 (×44): 3 mL via INTRAVENOUS

## 2019-01-18 MED ORDER — CHLORHEXIDINE GLUCONATE CLOTH 2 % EX PADS: 6.0000 | MEDICATED_PAD | Freq: Every day | CUTANEOUS | Status: DC

## 2019-01-18 MED ORDER — PANTOPRAZOLE SODIUM 40 MG PO TBEC
40.0000 mg | DELAYED_RELEASE_TABLET | Freq: Every day | ORAL | Status: DC
  Administered 2019-01-19 – 2019-01-20 (×2): 40 mg via ORAL
  Filled 2019-01-18: qty 1

## 2019-01-18 MED ORDER — OCUVITE-LUTEIN PO CAPS
1.0000 | ORAL_CAPSULE | Freq: Every day | ORAL | Status: DC
  Administered 2019-01-19 – 2019-02-13 (×24): 1 via ORAL
  Filled 2019-01-18 (×27): qty 1

## 2019-01-18 MED ORDER — PHENOL 1.4 % MT LIQD
1.0000 | OROMUCOSAL | Status: DC | PRN
  Filled 2019-01-18: qty 177

## 2019-01-18 MED ORDER — MELATONIN 5 MG PO TABS
5.0000 mg | ORAL_TABLET | Freq: Every evening | ORAL | Status: DC | PRN
  Administered 2019-01-20 – 2019-02-07 (×2): 5 mg via ORAL
  Filled 2019-01-18 (×6): qty 1

## 2019-01-18 MED ORDER — FENTANYL CITRATE (PF) 50 MCG/ML IJ SOLN
12.50 | INTRAMUSCULAR | Status: DC
Start: ? — End: 2019-01-18

## 2019-01-18 MED ORDER — DOCUSATE SODIUM 100 MG PO CAPS
100.0000 mg | ORAL_CAPSULE | Freq: Two times a day (BID) | ORAL | Status: DC
  Administered 2019-01-19 – 2019-02-04 (×13): 100 mg via ORAL
  Filled 2019-01-18 (×27): qty 1

## 2019-01-19 ENCOUNTER — Inpatient Hospital Stay: Payer: Medicare Other

## 2019-01-19 ENCOUNTER — Other Ambulatory Visit: Payer: Self-pay | Admitting: Internal Medicine

## 2019-01-19 LAB — CBC
HCT: 28.8 % — ABNORMAL LOW (ref 36.0–46.0)
Hemoglobin: 8.5 g/dL — ABNORMAL LOW (ref 12.0–15.0)
MCH: 28.1 pg (ref 26.0–34.0)
MCHC: 29.5 g/dL — ABNORMAL LOW (ref 30.0–36.0)
MCV: 95.4 fL (ref 80.0–100.0)
Platelets: 207 10*3/uL (ref 150–400)
RBC: 3.02 MIL/uL — ABNORMAL LOW (ref 3.87–5.11)
RDW: 21.8 % — ABNORMAL HIGH (ref 11.5–15.5)
WBC: 9.1 10*3/uL (ref 4.0–10.5)
nRBC: 0 % (ref 0.0–0.2)

## 2019-01-19 LAB — BASIC METABOLIC PANEL
Anion gap: 10 (ref 5–15)
BUN: 72 mg/dL — ABNORMAL HIGH (ref 8–23)
CO2: 43 mmol/L — ABNORMAL HIGH (ref 22–32)
Calcium: 8.8 mg/dL — ABNORMAL LOW (ref 8.9–10.3)
Chloride: 83 mmol/L — ABNORMAL LOW (ref 98–111)
Creatinine, Ser: 1.63 mg/dL — ABNORMAL HIGH (ref 0.44–1.00)
GFR calc Af Amer: 34 mL/min — ABNORMAL LOW (ref >60)
GFR calc non Af Amer: 29 mL/min — ABNORMAL LOW (ref >60)
Glucose, Bld: 99 mg/dL (ref 70–99)
Potassium: 3.7 mmol/L (ref 3.5–5.1)
Sodium: 136 mmol/L (ref 135–145)

## 2019-01-19 LAB — PROTIME-INR
INR: 2.6 — ABNORMAL HIGH (ref 0.8–1.2)
INR: 2.7 — ABNORMAL HIGH (ref 0.8–1.2)
Prothrombin Time: 27.6 seconds — ABNORMAL HIGH (ref 11.4–15.2)
Prothrombin Time: 28.4 seconds — ABNORMAL HIGH (ref 11.4–15.2)

## 2019-01-19 MED ORDER — WARFARIN - PHARMACIST DOSING INPATIENT
Freq: Every day | Status: DC
  Administered 2019-01-20 – 2019-02-09 (×10)

## 2019-01-19 MED ORDER — NYSTATIN 100000 UNIT/GM EX POWD
Freq: Three times a day (TID) | CUTANEOUS | Status: DC | PRN
  Administered 2019-01-19 – 2019-02-13 (×3): via TOPICAL
  Filled 2019-01-19 (×2): qty 15

## 2019-01-19 MED ORDER — WARFARIN SODIUM 2 MG PO TABS
2.0000 mg | ORAL_TABLET | ORAL | Status: AC
  Administered 2019-01-19: 2 mg via ORAL
  Filled 2019-01-19: qty 1

## 2019-01-19 MED ORDER — WARFARIN SODIUM 2.5 MG PO TABS
2.5000 mg | ORAL_TABLET | ORAL | Status: DC
  Filled 2019-01-19: qty 1

## 2019-01-19 MED ORDER — WARFARIN SODIUM 2 MG PO TABS
2.0000 mg | ORAL_TABLET | Freq: Once | ORAL | Status: AC
  Administered 2019-01-19: 2 mg via ORAL
  Filled 2019-01-19: qty 1

## 2019-01-19 MED ORDER — ORAL CARE MOUTH RINSE
15.0000 mL | Freq: Two times a day (BID) | OROMUCOSAL | Status: DC
  Administered 2019-01-19 – 2019-02-14 (×22): 15 mL via OROMUCOSAL

## 2019-01-19 MED ORDER — ENSURE ENLIVE PO LIQD: 237.0000 mL | Freq: Two times a day (BID) | ORAL | Status: DC

## 2019-01-19 MED ORDER — FUROSEMIDE 20 MG PO TABS
20.0000 mg | ORAL_TABLET | Freq: Two times a day (BID) | ORAL | Status: DC
  Administered 2019-01-19 – 2019-02-02 (×26): 20 mg via ORAL
  Filled 2019-01-19 (×26): qty 1

## 2019-01-19 MED ORDER — VITAMIN C 500 MG PO TABS
500.0000 mg | ORAL_TABLET | Freq: Every day | ORAL | Status: DC
  Administered 2019-01-20 – 2019-02-13 (×22): 500 mg via ORAL
  Filled 2019-01-19 (×24): qty 1

## 2019-01-19 NOTE — Consult Note (Signed)
ANTICOAGULATION CONSULT NOTE - Initial Consult  Pharmacy Consult for Warfarin Dosing  Indication: Mitral Valve Replacement, A. Fib    Allergies  Allergen Reactions  . Ace Inhibitors     Cough  . Doxycycline Nausea Only  . Hydrocodone Itching and Nausea Only  . Mercury     Other reaction(s): Unknown  . Silver Dermatitis    Severe itching  . Cephalexin Rash  . Clindamycin/Lincomycin Rash  . Nickel Rash  . Penicillins Rash    Has patient had a PCN reaction causing immediate rash, facial/tongue/throat swelling, SOB or lightheadedness with hypotension: YES Has patient had a PCN reaction causing severe rash involving mucus membranes or skin necrosis: NO Has patient had a PCN reaction that required hospitalization NO Has patient had a PCN reaction occurring within the last 10 years: NO If all of the above answers are "NO", then may proceed with Cephalosporin use.    Patient Measurements: Height: 4\' 11"  (149.9 cm) Weight: 117 lb 8 oz (53.3 kg) IBW/kg (Calculated) : 43.2   Vital Signs: Temp: 97.7 F (36.5 C) (07/21 0811) Temp Source: Oral (07/21 0811) BP: 110/52 (07/21 0811) Pulse Rate: 62 (07/21 0811)  Labs: Recent Labs    01/19/19 0026 01/19/19 1222  HGB 8.5*  --   HCT 28.8*  --   PLT 207  --   LABPROT 27.6* 28.4*  INR 2.6* 2.7*  CREATININE 1.63*  --     Estimated Creatinine Clearance: 20.2 mL/min (A) (by C-G formula based on SCr of 1.63 mg/dL (H)).   Medical History: Past Medical History:  Diagnosis Date  . Arthritis    "back, hands, knees" (04/10/2016)  . Chronic back pain   . 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 EF 25% by echo in 08/2015 b. RHC in 08/2015 showed normal filling pressures; c. 10/2018 Echo (in setting of atrial tachycardia): EF 25-30%, impaired relaxation. Glob HK. RVSP 29mmHg. Mod dil RA/LA. Mech MV w/ nl fxn (mean grad 62mmHg). Mod TR.   Marland Kitchen Chronic kidney disease   . Compression fracture    "several; all in my back" (04/10/2016)  . GERD (gastroesophageal reflux disease)   . Hypertension   . Lymphedema   . Pacemaker 05/2017  . PAF (paroxysmal atrial fibrillation) ([place])    a. On amio & Coumadin - CHA2DS2VASc = 4.  . S/P MVR (mitral valve replacement)    a. MVR 1994 b. redo MVR in 08/2014 - on Coumadin  . Shortness of breath dyspnea    Assessment: Pharmacy consulted for warfarin dosing and monitoring for 84 yo female with PMH of mitral valve replacement and A. Fib. Patient transferred to [facility] 7/20 PM from [facility] INR DOSE 7/17  2mg  7/18 1.6 3mg  7/19 2.0 2.5mg  7/20 2.6       2mg  (According to [facility] records patient did not receive a dose of warfarin 7/20.) 7/21     2.7       2 mg  Goal of Therapy:  INR 2.5-3.5  Monitor platelets by anticoagulation protocol: Yes   Plan:  INR level is therapeutic. Will order Warfarin 2mg  X 1 dose tonight.  Daily INR ordered. CBC stable.   Pharmacy will continue to follow and order warfarin based on INRs  Oswald Hillock, PharmD, BCPS Clinical Pharmacist 01/19/2019 1:06 PM

## 2019-01-19 NOTE — Consult Note (Signed)
[facility] Nurse wound consult note Patient's daughter, [name], is aware of the 8a.m. FaceTime consultation with Dr. Marla Roe tomorrow.  Val Riles, RN, MSN, CWOCN, CNS-BC, pager [phone]

## 2019-01-19 NOTE — Evaluation (Signed)
Physical Therapy Evaluation Patient Details Name: [name] MRN: [id number] DOB: 03-12-1935 Today's Date: 01/19/2019   History of Present Illness   84 y.o. female with a known history of right hip repair at [facility] on 01/11/2019 secondary to mechanical fall at home, paroxysmal atrial fibrillation, mitral valve replacement on Coumadin, COPD, CKD stage III, hypertension, history of left lower extremity hematoma with evacuation and porcine graft performed on 12/25/2018.  Clinical Impression  Pt limited with ability to participate secondary to multiple factors; the most noted was actually sensitivity with any palpation of b/l LEs (R>L) secondary to wounds even more so than R hip.  She is WBing as tolerated per Psychologist, sport and exercise at [facility], however she was unable to even get feet to the floor (b/l knees not flexing past ~70), poorly tolerating sitting at EOB and was anxious t/o the session and uncomfortable during sitting.  Needed a lot of encouragement and reassurance to do/tolerate much exercises; especially on the R.    Follow Up Recommendations SNF    Equipment Recommendations  (TBD at next venue of care)    Recommendations for Other Services       Precautions / Restrictions Precautions Precautions: Fall Restrictions Weight Bearing Restrictions: Yes RLE Weight Bearing: Weight bearing as tolerated      Mobility  Bed Mobility Overal bed mobility: Needs Assistance Bed Mobility: Sit to Supine;Supine to Sit     Supine to sit: Max assist Sit to supine: Max assist   General bed mobility comments: Pt indicates some hesitant willingness to attempt stting EOB, ultimately unable to assist much at all and overly sensitve to pain in legs during transition  Transfers                 General transfer comment: attempts to even get feet to floor while sitting EOB were pain limited.  Unable to bend knees enough to get feet to floor and pt anxious and with poor balance w/o the  session  Ambulation/Gait                Stairs            Wheelchair Mobility    Modified Rankin (Stroke Patients Only)       Balance Overall balance assessment: Needs assistance Sitting-balance support: Bilateral upper extremity supported Sitting balance-Leahy Scale: Fair Sitting balance - Comments: pt leaning backward, clearly uncomfortable in sitting                                     Pertinent Vitals/Pain Pain Assessment: 0-10 Pain Score: 8  Pain Location: extra cautious about wounds all up and down b/l LEs, more so than R hip    Home Living Family/patient expects to be discharged to:: Skilled nursing facility Living Arrangements: Children                    Prior Function Level of Independence: Needs assistance   Gait / Transfers Assistance Needed: uses rollator at baseline for household mobility  ADL's / Homemaking Assistance Needed: an aide comes 5x a week, 8a-12p to assist with ADLs/IADLs        Hand Dominance        Extremity/Trunk Assessment   Upper Extremity Assessment Upper Extremity Assessment: Generalized weakness(unable to elevate b/l shoulders >70, poor tolerance)    Lower Extremity Assessment Lower Extremity Assessment: Generalized weakness(b/l knee flexion <70, no AROM on R,  very limited on L)       Communication   Communication: No difficulties  Cognition Arousal/Alertness: Awake/alert Behavior During Therapy: WFL for tasks assessed/performed Overall Cognitive Status: Within Functional Limits for tasks assessed                                        General Comments      Exercises General Exercises - Lower Extremity Ankle Circles/Pumps: AROM;10 reps Heel Slides: PROM;5 reps(poor tolerance 2/2 pain) Hip ABduction/ADduction: AAROM;10 reps;AROM;PROM(A/AAROM on L, PROM on R) Straight Leg Raises: (unable to tolerate more than 2 reps on R, limited on L)   Assessment/Plan    PT  Assessment Patient needs continued PT services  PT Problem List Decreased strength;Decreased range of motion;Decreased activity tolerance;Decreased balance;Decreased mobility;Decreased coordination;Decreased knowledge of use of DME;Decreased safety awareness;Decreased knowledge of precautions;Pain       PT Treatment Interventions DME instruction;Gait training;Stair training;Functional mobility training;Therapeutic activities;Therapeutic exercise;Balance training;Patient/family education    PT Goals (Current goals can be found in the Care Plan section)  Acute Rehab PT Goals Patient Stated Goal: get leg wound pain under control PT Goal Formulation: With patient Time For Goal Achievement: 02/02/19 Potential to Achieve Goals: Fair    Frequency Min 2X/week   Barriers to discharge        Co-evaluation               AM-PAC PT "6 Clicks" Mobility  Outcome Measure Help needed turning from your back to your side while in a flat bed without using bedrails?: A Lot Help needed moving from lying on your back to sitting on the side of a flat bed without using bedrails?: Total Help needed moving to and from a bed to a chair (including a wheelchair)?: Total Help needed standing up from a chair using your arms (e.g., wheelchair or bedside chair)?: Total Help needed to walk in hospital room?: Total Help needed climbing 3-5 steps with a railing? : Total 6 Click Score: 7    End of Session   Activity Tolerance: Patient limited by pain Patient left: with bed alarm set;with call bell/phone within reach Nurse Communication: Mobility status PT Visit Diagnosis: Muscle weakness (generalized) (M62.81);Difficulty in walking, not elsewhere classified (R26.2);Pain Pain - Right/Left: Right Pain - part of body: Hip    Time: 6812-7517 PT Time Calculation (min) (ACUTE ONLY): 29 min   Charges:   PT Evaluation $PT Eval Low Complexity: 1 Low PT Treatments $Therapeutic Exercise: 8-22 mins         Kreg Shropshire, DPT 01/19/2019, 10:36 AM

## 2019-01-19 NOTE — Progress Notes (Signed)
[facility] [place] NAME: [name]    MR#:  [id number]  DATE OF BIRTH:  03/28/1935  SUBJECTIVE: Patient is admitted yesterday, patient had prolonged hospitalization at multiple hospitals here in [place] significant for history of hip fracture in the right leg status post fixation on July 13, also developed a left leg hematoma status post evacuation, plastic surgery on June 26, patient is moved to [facility] regional for postop recovery for hip fracture as per my understanding.  Spoke with patient daughter at bedside, she is eager to see physical therapist every day, also spoke with wound care nurse Judeen Hammans over the phone, patient to get dressing changes of her left leg hematoma by Judeen Hammans tomorrow, patient will have face time with plastic surgeon Dr. Marla Roe.  Spoke about this with [name] patient's daughter who is at the bedside.   CHIEF COMPLAINT:  No chief complaint on file.  Seeing the patient for first time, appears to be lethargic.  But able to respond to questions appropriately, says that she is not in pain, denies any complaints. REVIEW OF SYSTEMS:   ROS CONSTITUTIONAL: No fever, fatigue or weakness.  On oxygen 2 L. EYES: No blurred or double vision.  EARS, NOSE, AND THROAT: No tinnitus or ear pain.  RESPIRATORY: No cough, shortness of breath, wheezing or hemoptysis.  CARDIOVASCULAR: No chest pain, orthopnea, edema.  GASTROINTESTINAL: No nausea, vomiting, diarrhea or abdominal pain.  GENITOURINARY: No dysuria, hematuria.  ENDOCRINE: No polyuria, nocturia,  HEMATOLOGY: No anemia, easy bruising or bleeding SKIN: No rash or lesion. MUSCULOSKELETAL: Dressings present both both legs.  NEUROLOGIC: No tingling, numbness, weakness.  PSYCHIATRY: No anxiety or depression.   DRUG ALLERGIES:   Allergies  Allergen Reactions  . Ace Inhibitors     Cough  . Doxycycline Nausea Only  . Hydrocodone Itching and Nausea Only  . Mercury      Other reaction(s): Unknown  . Silver Dermatitis    Severe itching  . Cephalexin Rash  . Clindamycin/Lincomycin Rash  . Nickel Rash  . Penicillins Rash    Has patient had a PCN reaction causing immediate rash, facial/tongue/throat swelling, SOB or lightheadedness with hypotension: YES Has patient had a PCN reaction causing severe rash involving mucus membranes or skin necrosis: NO Has patient had a PCN reaction that required hospitalization NO Has patient had a PCN reaction occurring within the last 10 years: NO If all of the above answers are "NO", then may proceed with Cephalosporin use.    VITALS:  Blood pressure (!) 110/52, pulse 62, temperature 97.7 F (36.5 C), temperature source Oral, resp. rate (!) 22, height 4\' 11"  (1.499 m), weight 53.3 kg, SpO2 98 %.  PHYSICAL EXAMINATION:  GENERAL:  84 y.o.-year-old patient lying in the bed with no acute distress.  EYES: Pupils equal, round, reactive to light a. No scleral icterus. Extraocular muscles intact.  HEENT: Head atraumatic, normocephalic. Oropharynx and nasopharynx clear.  NECK:  Supple, no jugular venous distention. No thyroid enlargement, no tenderness.  LUNGS:  breath sounds clear, no rales, no wheeze. CARDIOVASCULAR: S1, S2 normal. No murmurs, rubs, or gallops.  ABDOMEN: Soft, nontender, nondistended. Bowel sounds present. No organomegaly or mass.  EXTREMITIES: Dressings present to both legs.  Daughter mentioned that patient dressings were changed at [facility], according to them patient does not need any debridement but needs dressing changes often. NEUROLOGIC: Cranial nerves II through XII are intact. Muscle strength 5/5 in all extremities. Sensation intact. Gait not checked.  PSYCHIATRIC: The patient is alert and oriented x 3.  SKIN: Dressing present for both legs.   LABORATORY PANEL:   CBC Recent Labs  Lab 01/19/19 0026  WBC 9.1  HGB 8.5*  HCT 28.8*  PLT 207    ------------------------------------------------------------------------------------------------------------------  Chemistries  Recent Labs  Lab 01/19/19 0026  NA 136  K 3.7  CL 83*  CO2 43*  GLUCOSE 99  BUN 72*  CREATININE 1.63*  CALCIUM 8.8*   ------------------------------------------------------------------------------------------------------------------  Cardiac Enzymes No results for input(s): TROPONINI in the last 168 hours. ------------------------------------------------------------------------------------------------------------------  RADIOLOGY:  Dg Chest Port 1 View  Result Date: 01/19/2019 CLINICAL DATA:  Dyspnea. EXAM: PORTABLE CHEST 1 VIEW COMPARISON:  Radiograph 01/05/2019 FINDINGS: Left-sided pacemaker remains in place. Post median sternotomy. Prostatic valve. Cardiomegaly is unchanged. Mediastinal contours unchanged. Improved bilateral pulmonary opacities. Residual bilateral perihilar reticular opacities. Linear opacity in the right mid lung may be fluid in the fissure or atelectasis/airspace disease. Retrocardiac opacity with possible left pleural effusion. Vertebral augmentation in the midthoracic spine. IMPRESSION: 1. Improved bilateral pulmonary opacities since prior exam. Residual reticular opacities may represent pulmonary edema or residual infection. 2. Possible left pleural effusion and basilar consolidation. 3. Right midlung atelectasis or fluid in the fissure. Electronically Signed   By: Keith Rake M.D.   On: 01/19/2019 03:16    EKG:   Orders placed or performed during the hospital encounter of 12/25/18  . ED EKG  . ED EKG    ASSESSMENT AND PLAN:  #1 right hip fracture status post repair at [facility], patient had surgery at [facility] on July 13, family wanted the transfer to [place] [facility] because is close to the family, patient to work with physical therapy, Occupational Therapy, daughter mentioned that she wants the Tylenol before physical therapy  comes. 2.  History of chronic combined systolic and diastolic heart failure, restarted the Lasix, continue to watch on telemetry, continue daily fluid status with I&O's, daily weights. 3.  Proximal atrial fibrillation, rate controlled, continue amiodarone, Coumadin, hold metoprolol as blood pressure is still soft, continue Coumadin, INR 2.7. 4.  Chronic respiratory failure, on 2 L of oxygen all the time.  According to the daughters patient was on higher amount of oxygen but got better with Lasix infusion. 5.  CKD stage III: Stable, creatinine 1.63.  6.  Chronic lower extremity hematomas; status post dressings, waffle boots placed at [facility], wound care nurse coming tomorrow to assess the wounds directly, patient had plastic surgery for left leg hematoma, plastic surgeon to do face time tomorrow with patient's daughter [name].   All the records are reviewed and case discussed with Care Management/Social Workerr. Management plans discussed with the patient, family and they are in agreement.  CODE STATUS: DNR  TOTAL TIME TAKING CARE OF THIS PATIENT: 40 minutes.   POSSIBLE D/C IN 1-2 DAYS, DEPENDING ON CLINICAL CONDITION.  Epifanio Lesches M.D on 01/19/2019 at 3:56 PM  Between 7am to 6pm - Pager - [phone]  After 6pm go to [email or web address] - password [email or web address] [facility]  Office  [phone]  CC: Primary care physician; Rusty Aus, MD   Note: This dictation was prepared with Dragon dictation along with smaller phrase technology. Any transcriptional errors that result from this process are unintentional.

## 2019-01-19 NOTE — Consult Note (Signed)
ANTICOAGULATION CONSULT NOTE - Initial Consult  Pharmacy Consult for Warfarin Dosing  Indication: Mitral Valve Replacement, A. Fib    Allergies  Allergen Reactions  . Ace Inhibitors     Cough  . Doxycycline Nausea Only  . Hydrocodone Itching and Nausea Only  . Mercury     Other reaction(s): Unknown  . Silver Dermatitis    Severe itching  . Cephalexin Rash  . Clindamycin/Lincomycin Rash  . Nickel Rash  . Penicillins Rash    Has patient had a PCN reaction causing immediate rash, facial/tongue/throat swelling, SOB or lightheadedness with hypotension: YES Has patient had a PCN reaction causing severe rash involving mucus membranes or skin necrosis: NO Has patient had a PCN reaction that required hospitalization NO Has patient had a PCN reaction occurring within the last 10 years: NO If all of the above answers are "NO", then may proceed with Cephalosporin use.    Patient Measurements: Height: 4\' 11"  (149.9 cm) Weight: 117 lb 8 oz (53.3 kg) IBW/kg (Calculated) : 43.2   Vital Signs: Temp: 98.5 F (36.9 C) (07/20 2008) Temp Source: Oral (07/20 2008) BP: 109/54 (07/20 2008) Pulse Rate: 64 (07/20 2008)  Labs: Recent Labs    01/19/19 0026  HGB 8.5*  HCT 28.8*  PLT 207  LABPROT 27.6*  INR 2.6*  CREATININE 1.63*    Estimated Creatinine Clearance: 20.2 mL/min (A) (by C-G formula based on SCr of 1.63 mg/dL (H)).   Medical History: Past Medical History:  Diagnosis Date  . Arthritis    "back, hands, knees" (04/10/2016)  . Chronic back pain   . 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 EF 25% by echo in 08/2015 b. RHC in 08/2015 showed normal filling pressures; c. 10/2018 Echo (in setting of atrial tachycardia): EF 25-30%, impaired relaxation. Glob HK. RVSP 55mmHg. Mod dil RA/LA. Mech MV w/ nl fxn (mean grad 28mmHg). Mod TR.   Marland Kitchen Chronic kidney disease   . Compression fracture    "several; all in my back" (04/10/2016)  .  GERD (gastroesophageal reflux disease)   . Hypertension   . Lymphedema   . Pacemaker 05/2017  . PAF (paroxysmal atrial fibrillation) ([place])    a. On amio & Coumadin - CHA2DS2VASc = 4.  . S/P MVR (mitral valve replacement)    a. MVR 1994 b. redo MVR in 08/2014 - on Coumadin  . Shortness of breath dyspnea    Assessment: Pharmacy consulted for warfarin dosing and monitoring for 84 yo female with PMH of mitral valve replacement and A. Fib. Patient transferred to [facility] 7/20 PM from [place] INR DOSE 7/17  2mg  7/18 1.6 3mg  7/19 2.0 2.5mg  7/20 3.0  Goal of Therapy:  INR 2.5-3.5  Monitor platelets by anticoagulation protocol: Yes   Plan:  7/21 @ 0100 INR: 2.6. Level is therapeutic.  According to [facility] records patient did not receive a dose of warfarin 7/20. Will order Warfarin 2mg  X 1 dose now.  INR order @ 1200 7/21  Pharmacy will continue to follow and order warfarin based on INRs  Pernell Dupre, PharmD, BCPS Clinical Pharmacist 01/19/2019 1:36 AM

## 2019-01-19 NOTE — Consult Note (Signed)
[place] Nurse wound consult note Patient receiving care in [facility].  I have placed a request via Secure Chat to NP A. Seals  requesting photos of the wound areas of concern to be placed in the EMR.   Reason for Consult: BLE hematomas with dressings and waffle boots in place Val Riles, RN, MSN, [employer], CNS-BC, pager [phone]

## 2019-01-19 NOTE — Consult Note (Signed)
[place] Nurse wound consult note Reason for Consult: BLE hematoma care I spoke with the patient's daughter, [name].  She explained there are two new hematomas on the RLE that the plastic surgeons at [facility] looked at, and did not think they needed evacuation.  She explains each is slightly smaller than a baseball in size.  Also, that staff at [facility] were putting Xeroform, ABD, and gauze over them.  The LLE has a new hematoma that is weeping, and it is located just above the knee.  The dressing for this was the same as for the RLE hematomas.  [name] is most comfortable with waiting until the FaceTime consultation with Dr. Marla Roe tomorrow for anything to be done to either leg.  I am in the process of confirming a time with Dr. Marla Roe and that will be communicated to [name]'s cell number: [phone].  Dr. Eusebio Friendly cell number is [phone].  Val Riles, RN, MSN, CWOCN, CNS-BC, pager [phone]

## 2019-01-19 NOTE — Consult Note (Signed)
[place] Nurse wound consult note Patient receiving care in [facility] 243. Reason for Consult: bilateral lower extremity hematomas with dressings and waffle boots in place Wound type: The left lower leg, per EMR note on 11/19/18, is an operative site under the care and direction of C. [name] and J. Byrnett.  There has been a graft to the area.  For that reason, please contact Drs. [name] and/or Byrnett for guidance on the LLE.   As for the RLE, I have not been able to find a note from an MD to indicate the cause/treatment of the RLE.  I will continue to pursue this to try to determine.  At this point, I am not certain it would be safe for a nurse to remove the dressing, based on what I read about the patient's anticoagulation status and difficulty in the past with stopping the bleeding on the LLE.  Val Riles, RN, MSN, CWOCN, CNS-BC, pager [phone]

## 2019-01-19 NOTE — Progress Notes (Addendum)
Initial Nutrition Assessment  DOCUMENTATION CODES:   Not applicable  INTERVENTION:   Ensure Enlive po BID, each supplement provides 350 kcal and 20 grams of protein  Continue Ocuvite daily for wound healing (provides zinc, vitamin A, vitamin C, Vitamin E, copper, and selenium)  Continue vitamin C 500mg  po daily   Recommend SLP evaluation   NUTRITION DIAGNOSIS:   Increased nutrient needs related to wound healing as evidenced by increased estimated needs.  GOAL:   Patient will meet greater than or equal to 90% of their needs  MONITOR:   PO intake, Supplement acceptance, Labs, Weight trends, I & O's, Skin  REASON FOR ASSESSMENT:   Malnutrition Screening Tool    ASSESSMENT:   84 y.o. female with a known history of right hip repair at [facility] on 01/11/2019 secondary to mechanical fall at home, paroxysmal atrial fibrillation, mitral valve replacement on Coumadin, COPD, CKD stage IV requiring CRRT last admit, hypertension, history of left lower extremity hematoma with evacuation and porcine graft performed on 12/25/2018.  RD familiar with this patient from multiple recent admits. Pt with fairly poor appetite and oral intake during her last admit; pt was on a dysphagia diet with nectar thick fluids at that time. Pt does enjoy Boost supplements which were brought in by her family during her last admit. RD will add Ensure supplements while pt is in hospital. Recommend continue vitamins to support wound healing. Per chart review, pt weighed 59.5kg on the day of her hip surgery 1 week ago. [name] UBW is ~66kg. [name] admit weight is documented at 53.3kg. It appears, pt has lost ~14lbs over the last few weeks. It is difficult to determine how much actual weight loss pt has had for certain as her weights vary so much.   Medications reviewed and include: colace, ocuvite, protonix, vitamin C, warfarin   Labs reviewed: BUN 72(H), creat 1.63(H) Hgb 8.5(L), Hct 28.8(L)  Diet Order:   Diet  Order            Diet regular Room service appropriate? Yes; Fluid consistency: Thin  Diet effective now             EDUCATION NEEDS:   Education needs have been addressed  Skin:  Skin Assessment: Reviewed RN Assessment(incision R hip, multiple hematomas and wounds on legs and feet)  Last BM:  pta  Height:   Ht Readings from Last 1 Encounters:  01/23/2019 4\' 11"  (1.499 m)    Weight:   Wt Readings from Last 1 Encounters:  01/28/2019 53.3 kg    Ideal Body Weight:  44.5 kg  BMI:  Body mass index is 23.73 kg/m.  Estimated Nutritional Needs:   Kcal:  1400-1600kcal/day  Protein:  70-80g/day  Fluid:  1.1L/day  Koleen Distance MS, RD, LDN Pager #- [phone] Office#- [phone] After Hours Pager: [phone]

## 2019-01-19 NOTE — H&P (Signed)
[place] [facility] [place] NAME: [name]    MR#:  [id number]  DATE OF BIRTH:  10/11/1934  DATE OF ADMISSION:  01/26/2019  PRIMARY CARE PHYSICIAN: Rusty Aus, MD   REQUESTING/REFERRING PHYSICIAN: Direct transfer/admission from [place]:  No chief complaint on file.   HISTORY OF PRESENT ILLNESS:  [name]  is a 84 y.o. female with a known history of right hip repair at [facility] on 01/11/2019 secondary to mechanical fall at home, paroxysmal atrial fibrillation, mitral valve replacement on Coumadin, COPD, CKD stage III, hypertension, history of left lower extremity hematoma with evacuation and porcine graft performed on 12/25/2018.  Patient had originally been admitted to [facility] after mechanical fall at home with right hip fracture.  At that time, she was felt to be high [name] for surgery.  Patient was transferred to [facility] and underwent right hip repair on 01/11/2019.  Postoperatively she experienced decompensated heart failure and required Lasix infusion which was discontinued on 01/17/2019.  Postoperatively she had heparin infusion to warfarin bridge for her history of mechanical mitral valve and atrial fibrillation.  She follows up outpatient with Dr. Rockey Situ. INR was 2 on 01/17/2019 with 2.5 mg dose of Coumadin.  She also has a history of chronic hypoxic respiratory failure.  She is followed outpatient with Dr.Aleskerov. On arrival she is on 2 L oxygen per nasal cannula with oxygen saturation of 100%.  She continues to require close management of bilateral lower extremities with multiple hematomas.  She has porcine graft healing well to her left lower extremity.  Currently she is awake and alert and oriented x4.  She is afebrile.  She denies shortness of breath or chest pain.  No nausea, vomiting, diarrhea.  She denies abdominal pain.  We have admitted her to the hospitalist service for further management.   PAST MEDICAL HISTORY:   Past Medical History:  Diagnosis Date  . Arthritis    "back, hands, knees" (04/10/2016)  . Chronic back pain   . 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 EF 25% by echo in 08/2015 b. RHC in 08/2015 showed normal filling pressures; c. 10/2018 Echo (in setting of atrial tachycardia): EF 25-30%, impaired relaxation. Glob HK. RVSP 69mmHg. Mod dil RA/LA. Mech MV w/ nl fxn (mean grad 39mmHg). Mod TR.   Marland Kitchen Chronic kidney disease   . Compression fracture    "several; all in my back" (04/10/2016)  . GERD (gastroesophageal reflux disease)   . Hypertension   . Lymphedema   . Pacemaker 05/2017  . PAF (paroxysmal atrial fibrillation) ([place])    a. On amio & Coumadin - CHA2DS2VASc = 4.  . S/P MVR (mitral valve replacement)    a. MVR 1994 b. redo MVR in 08/2014 - on Coumadin  . Shortness of breath dyspnea     PAST SURGICAL HISTORY:   Past Surgical History:  Procedure Laterality Date  . BREAST LUMPECTOMY Right 2005?   benign lump excision  . CARDIAC CATHETERIZATION    . [place], 2016   "MVR; MVR"  . CARDIOVERSION N/A 11/06/2018   Procedure: CARDIOVERSION;  Surgeon: Minna Merritts, MD;  Location: [facility] ORS;  Service: Cardiovascular;  Laterality: N/A;  . CATARACT EXTRACTION W/PHACO Right 02/20/2015   Procedure: CATARACT EXTRACTION PHACO AND INTRAOCULAR LENS PLACEMENT ([place]);  Surgeon: Estill Cotta, MD;  Location: [facility] ORS;  Service: Ophthalmology;  Laterality: Right;  US01:31.2 AP 25.3% CDE40.13 Fluid lot # [id number]  . DRESSING CHANGE UNDER ANESTHESIA Left 11/17/2018   Procedure: DRESSING CHANGE;  Surgeon: Robert Bellow, MD;  Location: [facility] ORS;  Service: General;  Laterality: Left;  no anesthesia  . ELECTROPHYSIOLOGIC STUDY N/A 10/09/2015   Procedure: CARDIOVERSION;  Surgeon: Wellington Hampshire, MD;  Location: [facility] ORS;  Service: Cardiovascular;  Laterality: N/A;  . INCISION AND DRAINAGE OF  WOUND Left 11/13/2018   Procedure: LEFT LEG DRESSING CHANGE;  Surgeon: Robert Bellow, MD;  Location: [facility] ORS;  Service: General;  Laterality: Left;  . INCISION AND DRAINAGE OF WOUND Left 11/19/2018   Procedure: LEFT LEG DRESSING CHANGE AND GRAFT APPLICATION;  Surgeon: Robert Bellow, MD;  Location: [facility] ORS;  Service: General;  Laterality: Left;  . IR GENERIC HISTORICAL  04/01/2016   IR RADIOLOGIST EVAL & MGMT 04/01/2016 [facility]  . IR GENERIC HISTORICAL  04/15/2016   IR VERTEBROPLASTY CERV/THOR BX INC UNI/BIL INC/INJECT/IMAGING 04/15/2016 Luanne Bras, MD [facility]  . IR GENERIC HISTORICAL  04/15/2016   IR VERTEBROPLASTY EA ADDL (T&LS) BX INC UNI/BIL INC INJECT/IMAGING 04/15/2016 Luanne Bras, MD [facility]  . IR GENERIC HISTORICAL  04/15/2016   IR VERTEBROPLASTY EA ADDL (T&LS) BX INC UNI/BIL INC INJECT/IMAGING 04/15/2016 Luanne Bras, MD [facility]  . IR GENERIC HISTORICAL  05/20/2016   IR RADIOLOGIST EVAL & MGMT 05/20/2016 [facility]  . IR GENERIC HISTORICAL  06/13/2016   IR VERTEBROPLASTY EA ADDL (T&LS) BX INC UNI/BIL INC INJECT/IMAGING 06/13/2016 Luanne Bras, MD [facility]  . IR GENERIC HISTORICAL  06/13/2016   IR SACROPLASTY BILATERAL 06/13/2016 Luanne Bras, MD [facility]  . IR GENERIC HISTORICAL  06/13/2016   IR VERTEBROPLASTY LUMBAR BX INC UNI/BIL INC/INJECT/IMAGING 06/13/2016 Luanne Bras, MD [facility]  . IRRIGATION AND DEBRIDEMENT HEMATOMA Left 07/05/2015   Procedure: IRRIGATION AND DEBRIDEMENT HEMATOMA;  Surgeon: Robert Bellow, MD;  Location: [facility] ORS;  Service: General;  Laterality: Left;  . IRRIGATION AND DEBRIDEMENT HEMATOMA Left 11/11/2018   Procedure: IRRIGATION AND DEBRIDEMENT LEFT LEG HEMATOMA;  Surgeon: Robert Bellow, MD;  Location: [facility] ORS;  Service: General;  Laterality: Left;  . pyloric stenosis  07/1937  . US ECHOCARDIOGRAPHY      SOCIAL HISTORY:   Social History   Tobacco Use  . Smoking  status: Former Smoker    Packs/day: 1.00    Years: 27.00    Pack years: 27.00    Types: Cigarettes    Quit date: 07/01/1980    Years since quitting: 38.5  . Smokeless tobacco: Never Used  . Tobacco comment: "quit smoking ~ 1980  Substance Use Topics  . Alcohol use: Not Currently    Alcohol/week: 0.0 standard drinks    Comment: 04/10/2016 "I'll have a drink on holidays/special occasions"    FAMILY HISTORY:   Family History  Problem Relation Age of Onset  . Stroke Mother   . Renal Disease Father     DRUG ALLERGIES:   Allergies  Allergen Reactions  . Ace Inhibitors     Cough  . Doxycycline Nausea Only  . Hydrocodone Itching and Nausea Only  . Mercury     Other reaction(s): Unknown  . Silver Dermatitis    Severe itching  . Cephalexin Rash  . Clindamycin/Lincomycin Rash  . Nickel Rash  . Penicillins Rash    Has patient had a PCN reaction causing immediate rash, facial/tongue/throat swelling, SOB or lightheadedness with hypotension: YES Has patient had a PCN reaction causing severe rash  involving mucus membranes or skin necrosis: NO Has patient had a PCN reaction that required hospitalization NO Has patient had a PCN reaction occurring within the last 10 years: NO If all of the above answers are "NO", then may proceed with Cephalosporin use.    REVIEW OF SYSTEMS:   Review of Systems  Constitutional: Negative for chills, fever and malaise/fatigue.  HENT: Negative for congestion, sinus pain and sore throat.   Eyes: Negative for blurred vision, double vision and pain.  Respiratory: Positive for shortness of breath. Negative for cough, sputum production and wheezing.   Cardiovascular: Negative for chest pain, palpitations and leg swelling.  Gastrointestinal: Negative for abdominal pain, blood in stool, constipation, diarrhea, heartburn, melena, nausea and vomiting.  Genitourinary: Negative for dysuria, flank pain and hematuria.  Musculoskeletal: Positive for falls and  joint pain.  Skin: Negative.  Negative for itching and rash.  Neurological: Positive for weakness. Negative for dizziness and headaches.  Endo/Heme/Allergies: Bruises/bleeds easily.  Psychiatric/Behavioral: Negative.  Negative for depression.     MEDICATIONS AT HOME:   Prior to Admission medications   Medication Sig Start Date End Date Taking? Authorizing Provider  acetaminophen (TYLENOL) 325 MG tablet Take 2 tablets (650 mg total) by mouth every 6 (six) hours as needed for mild pain (or Fever >/= 101). Patient taking differently: Take 975 mg by mouth 3 (three) times daily.  01/05/19  Yes Darel Hong D, NP  amiodarone (PACERONE) 200 MG tablet Take 1 tablet (200 mg total) by mouth daily. 09/24/18  Yes Gollan, Kathlene November, MD  calcium carbonate (OSCAL) 1500 (600 Ca) MG TABS tablet Take 1,500 mg by mouth daily with breakfast.   Yes [provider]  cholecalciferol (VITAMIN D3) 10 MCG (400 UNIT) TABS tablet Take 1,600 Units by mouth daily.   Yes [provider]  diclofenac (FLECTOR) 1.3 % PTCH Place 1 patch onto the skin 2 (two) times daily.   Yes [provider]  FUROSEMIDE IN SODIUM CHLORIDE IV Inject 500 mg into the vein continuous. Rate 73ml/hr (10mg /hr)   Yes [provider]  ipratropium-albuterol (DUONEB) 0.5-2.5 (3) MG/3ML SOLN Take 3 mLs by nebulization every 6 (six) hours as needed for up to 90 doses. 12/15/18  Yes Gouru, Aruna, MD  lidocaine (LIDODERM) 5 % Place 1 patch onto the skin daily. Remove & Discard patch within 12 hours or as directed by MD   Yes [provider]  LIDOCAINE HCL IJ Inject 0.5 mLs as directed daily as needed (for PIV insertion).   Yes [provider]  Melatonin 5 MG TABS Take 1 tablet (5 mg total) by mouth at bedtime as needed (sleep). 01/05/19  Yes Darel Hong D, NP  metoprolol succinate (TOPROL-XL) 12.5 mg TB24 24 hr tablet Take 6.25 mg by mouth 2 (two) times a day.   Yes [provider]   multivitamin-lutein (OCUVITE-LUTEIN) CAPS capsule Take 1 capsule by mouth daily. 01/06/19  Yes Bradly Bienenstock, NP  ondansetron (ZOFRAN) 4 MG/2ML SOLN injection Inject 2 mLs (4 mg total) into the vein every 6 (six) hours as needed for nausea. 01/05/19  Yes Darel Hong D, NP  oxyCODONE (OXY IR/ROXICODONE) 5 MG immediate release tablet Take 2.5 mg by mouth every 4 (four) hours as needed for severe pain.   Yes [provider]  vitamin C (ASCORBIC ACID) 500 MG tablet Take 500 mg by mouth daily.   Yes [provider]  WARFARIN SODIUM PO Take 2.5 mg by mouth.    Yes [provider]  acetaminophen (TYLENOL) 650 MG suppository Place 1 suppository (650 mg total) rectally every 6 (six) hours as needed for mild pain (or Fever >/= 101). 01/05/19   Bradly Bienenstock, NP  Chlorhexidine Gluconate Cloth 2 % PADS Apply 6 each topically daily. Patient not taking: Reported on 01/09/2019 01/06/19   Darel Hong D, NP  heparin 1000 unit/mL SOLN injection 1-6 mLs (1,000-6,000 Units total) by CRRT route as needed (Use to fill CRRT catheter with heparin 1000 units/mL per catheter volume.). Patient not taking: Reported on 12/31/2018 01/05/19   Darel Hong D, NP  heparin 25000-0.45 UT/250ML-% infusion Inject 1,350 Units/hr into the vein continuous. Patient not taking: Reported on 01/08/2019 01/05/19   Bradly Bienenstock, NP  traMADol (ULTRAM) 50 MG tablet Take 1 tablet (50 mg total) by mouth every 6 (six) hours as needed for moderate pain. Patient not taking: Reported on 01/27/2019 01/05/19   Darel Hong D, NP      VITAL SIGNS:  Blood pressure (!) 109/54, pulse 64, temperature 98.5 F (36.9 C), temperature source Oral, resp. rate 16, height 4\' 11"  (1.499 m), weight 53.3 kg, SpO2 100 %.  PHYSICAL EXAMINATION:  Physical Exam  GENERAL:  84 y.o.-year-old patient lying in the bed with no acute distress.  EYES: Pupils equal, round, reactive to light and accommodation. No scleral icterus.  Extraocular muscles intact.  HEENT: Head atraumatic, normocephalic. Oropharynx and nasopharynx clear.  NECK:  Supple, no jugular venous distention. No thyroid enlargement, no tenderness.  LUNGS: Normal breath sounds bilaterally, no wheezing, rales,rhonchi or crepitation. No use of accessory muscles of respiration.  CARDIOVASCULAR: Regular rate and rhythm, S1, S2 normal. No murmurs, rubs, or gallops.  ABDOMEN: Soft, nondistended, nontender. Bowel sounds present. No organomegaly or mass.  EXTREMITIES: No pedal edema, cyanosis, or clubbing. Bilateral lower extremity hematomas , left lower extremity percine graft site healing well. General weakness. Hematoma at right hip NEUROLOGIC: Cranial nerves II through XII are intact. Muscle strength 5/5 in all extremities. Sensation intact. Gait not checked.  PSYCHIATRIC: The patient is alert and oriented x 3.  Normal affect and good eye contact. SKIN: No obvious rash, lesion, or ulcer.   LABORATORY PANEL:   CBC Recent Labs  Lab 01/19/19 0026  WBC 9.1  HGB 8.5*  HCT 28.8*  PLT 207   ------------------------------------------------------------------------------------------------------------------  Chemistries  Recent Labs  Lab 01/19/19 0026  NA 136  K 3.7  CL 83*  CO2 43*  GLUCOSE 99  BUN 72*  CREATININE 1.63*  CALCIUM 8.8*   ------------------------------------------------------------------------------------------------------------------  Cardiac Enzymes No results for input(s): TROPONINI in the last 168 hours. ------------------------------------------------------------------------------------------------------------------  RADIOLOGY:  No results found.    IMPRESSION AND PLAN:   1.  Status post right hip repair - PT and OT consulted -Social service consulted for home needs at the time of discharge - We will continue to monitor closely for evidence of enlarging hematoma at the surgical site. -We will manage pain with analgesic   2.  Chronic combined systolic and diastolic CHF - P.o. Lasix continued - Telemetry monitoring - Chest x-ray pending -Repeat BMP in the a.m. -We will continue to monitor renal function closely  3.  Paroxysmal atrial fibrillation - Amiodarone continued - Coumadin continued -PT/INR in the a.m.  4.  Chronic hypoxic respiratory failure - Patient appears to be returned to baseline of oxygen at 2 L/min with 100% oxygen saturation  5.  Bilateral lower extremity hematoma - We will consult wound care for dressing changes and recommendations  6.  CKD stage III - Foley catheter with adequate urine output -We will continue to monitor renal function with BUN and creatinine in the a.m.   DVT and PPI prophylaxis    All the records are reviewed and case discussed with ED provider. The plan of care was discussed in details with the patient (and family). I answered all questions. The patient agreed to proceed with the above mentioned plan. Further management will depend upon hospital course.   CODE STATUS: Full code  TOTAL TIME TAKING CARE OF THIS PATIENT:45 minutes.    [name] Dills Aujanae Mccullum CRNPon 01/19/2019 at 1:24 AM  Pager - [phone]  After 6pm go to [email or web address] - Proofreader  [facility]  Office  [phone]  CC: Primary care physician; Rusty Aus, MD   Note: This dictation was prepared with Dragon dictation along with smaller phrase technology. Any transcriptional errors that result from this process are unintentional.

## 2019-01-19 NOTE — Progress Notes (Addendum)
Pt arrived via EMS from [place]. Family at bedside. Pt family discussed care goals with NP Seals at bedside. Pt weaned from 6L O2 to 2L on arrival with sats at 100%. Pt drowsy at admission but has perked up somewhat to be more alert to staff. Pt is still confused at times. Family expressed concern over her legs and subsequent wound care and has stated they would like to be present during wound care consult. Pt has both legs wrapped in gauze and coban, per pt family they were changed 7-19 at [facility]. Pt arrived with a foley that was inserted at [facility] on 7-19 per transfer paperwork. NP would like to leave it in until a more thorough assessment of mobility restrictions can be made.

## 2019-01-19 NOTE — Progress Notes (Signed)
Ch visited with pt and daughter as a f/u. [place] spoke directly with daughter to see how well the pt was progressing post-op. Daughter shared that the pt successfully had surgery at [place] but the daughter wanted to transfer pt back to [facility] to be closer to home and for preference of care providers. Ch provided words of encouragement and acknowledged the challenges of the difficult decisions that were presented to them over the course of her mom trying to recover form a fall. Ch also supported the family decisions to do what they thought was best for their loved one. Ch asked guided questions regarding the rehab options that have been presented to them and encouraged the family to be prepared to make changes and adjustments to create a safe space for the pt for when she transitions home. Daughter appreciated visited and would like a f/u visit.  F/U: Check with assigned nurse before entry and don face mask while visiting    01/19/19 1100  Clinical Encounter Type  Visited With Family  Visit Type Follow-up;Social support;Spiritual support  Spiritual Encounters  Spiritual Needs Emotional;Grief support  Stress Factors  Patient Stress Factors Health changes;Loss of control;Major life changes  Family Stress Factors Major life changes

## 2019-01-19 NOTE — NC FL2 (Signed)
[place] LEVEL OF CARE SCREENING TOOL     IDENTIFICATION  Patient Name: [name] Birthdate: [birth year] Sex: female Admission Date (Current Location): 01/09/2019  [place] and [place] Number:  Engineering geologist and Address:  [facility], [address] [place], [place] [address]      Provider Number: [id number]  Attending Physician Name and Address:  Epifanio Lesches, MD  Relative Name and Phone Number:  Delorse Lek    Current Level of Care: Hospital Recommended Level of Care: [place] Prior Approval Number:    Date Approved/Denied:   PASRR Number: [id number]  Discharge Plan: SNF    Current Diagnoses: Patient Active Problem List   Diagnosis Date Noted  . Hip fracture requiring operative repair ([place]) 01/03/2019  . GERD (gastroesophageal reflux disease) 12/25/2018  . Closed right hip fracture ([place]) 12/25/2018  . Hip fracture ([place]) 12/25/2018  . Goals of care, counseling/discussion   . Palliative care by specialist   . Wound of left leg   . Persistent atrial fibrillation   . Atrial tachycardia ([place])   . Chronic kidney disease (CKD), stage IV (severe) ([place])   . Chest pain of uncertain etiology   . Traumatic hematoma of left lower leg 11/09/2018  . Acute exacerbation of CHF (congestive heart failure) ([place]) 11/04/2018  . Pulmonary hypertension, unspecified ([place]) 11/04/2018  . S/P MVR (mitral valve replacement) 11/04/2018  . Encounter for therapeutic drug monitoring 09/09/2017  . Encounter for anticoagulation discussion and counseling 09/09/2017  . PAF (paroxysmal atrial fibrillation) ([place]) 02/25/2017  . Chronic venous insufficiency 10/27/2016  . Shortness of breath 08/14/2016  . Pressure injury of skin 04/12/2016  . Malnutrition of moderate degree 04/11/2016  . Vertebral compression fracture ([place]) 04/10/2016  . Intractable pain 04/10/2016  . Spinal compression fracture ([place]) 04/10/2016  .  Acute on chronic systolic heart failure ([place]) 11/19/2015  . Lymphedema   . Bilateral lower extremity edema 08/30/2015  . Acute on chronic renal failure ([place]) 04/28/2015  . Hyponatremia 04/28/2015  . History of mitral valve replacement with mechanical valve 11/04/2014  . Long term current use of anticoagulant therapy 09/28/2014  . Chronic combined systolic and diastolic CHF  20/35/5974  . 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 Generalized OA 11/28/2013  . HLD (hyperlipidemia) 11/28/2013    Orientation RESPIRATION BLADDER Height & Weight     Self, Time, Situation  O2 Continent Weight: 53.3 kg Height:  4\' 11"  (149.9 cm)  BEHAVIORAL SYMPTOMS/MOOD NEUROLOGICAL BOWEL NUTRITION STATUS    (None) Continent Diet  AMBULATORY STATUS COMMUNICATION OF NEEDS Skin   Extensive Assist Verbally Surgical wounds, Other (Comment)(Hematomas lower extremities. Wound care protocol is currently iin development)                       Personal Care Assistance Level of Assistance  Bathing, Feeding, Dressing Bathing Assistance: Maximum assistance Feeding assistance: Limited assistance Dressing Assistance: Limited assistance     Functional Limitations Info             [place]  OT (By licensed OT), PT (By licensed PT)     PT Frequency: daily OT Frequency: daily            Contractures Contractures Info: Not present    Additional Factors Info  Code Status Code Status Info: DNR             Current Medications (01/19/2019):  This is the current hospital active medication  list Current Facility-Administered Medications  Medication Dose Route Frequency Provider Last Rate Last Dose  . acetaminophen (TYLENOL) suppository 650 mg  650 mg Rectal Q6H PRN Seals, [name] Dills, NP      . acetaminophen (TYLENOL) tablet 500 mg  500 mg Oral Q6H Seals, Angela H, NP   500 mg at 01/19/19 0529  . acetaminophen (TYLENOL) tablet 650 mg  650 mg Oral Q6H PRN Gardiner Barefoot H, NP    650 mg at 01/19/19 0939  . amiodarone (PACERONE) tablet 200 mg  200 mg Oral Daily Seals, Angela H, NP   200 mg at 01/19/19 0940  . docusate sodium (COLACE) capsule 100 mg  100 mg Oral BID Gardiner Barefoot H, NP   100 mg at 01/19/19 0940  . feeding supplement (ENSURE ENLIVE) (ENSURE ENLIVE) liquid 237 mL  237 mL Oral BID BM Epifanio Lesches, MD      . furosemide (LASIX) tablet 20 mg  20 mg Oral BID Epifanio Lesches, MD      . ipratropium-albuterol (DUONEB) 0.5-2.5 (3) MG/3ML nebulizer solution 3 mL  3 mL Nebulization Q6H PRN Seals, [name] Dills, NP      . MEDLINE mouth rinse  15 mL Mouth Rinse BID Gardiner Barefoot H, NP   15 mL at 01/19/19 0942  . Melatonin TABS 5 mg  5 mg Oral QHS PRN Seals, [name] Dills, NP      . menthol-cetylpyridinium (CEPACOL) lozenge 3 mg  1 lozenge Oral PRN Seals, [name] Dills, NP       Or  . phenol (CHLORASEPTIC) mouth spray 1 spray  1 spray Mouth/Throat PRN Seals, [name] Dills, NP      . metoCLOPramide (REGLAN) tablet 5-10 mg  5-10 mg Oral Q8H PRN Seals, [name] Dills, NP       Or  . metoCLOPramide (REGLAN) injection 5-10 mg  5-10 mg Intravenous Q8H PRN Seals, [name] Dills, NP      . multivitamin-lutein (OCUVITE-LUTEIN) capsule 1 capsule  1 capsule Oral Daily Seals, [name] Dills, NP   1 capsule at 01/19/19 0939  . nystatin (MYCOSTATIN/NYSTOP) topical powder   Topical TID PRN Epifanio Lesches, MD      . ondansetron (ZOFRAN) tablet 4 mg  4 mg Oral Q6H PRN Seals, [name] Dills, NP       Or  . ondansetron (ZOFRAN) injection 4 mg  4 mg Intravenous Q6H PRN Seals, [name] Dills, NP      . pantoprazole (PROTONIX) EC tablet 40 mg  40 mg Oral Daily Seals, Angela H, NP   40 mg at 01/19/19 0940  . sodium chloride flush (NS) 0.9 % injection 3 mL  3 mL Intravenous Q12H Seals, Angela H, NP   3 mL at 01/19/19 0940  . [START ON 01/20/2019] vitamin C (ASCORBIC ACID) tablet 500 mg  500 mg Oral Daily Epifanio Lesches, MD      . warfarin (COUMADIN) tablet 2 mg  2 mg Oral ONCE-1800 Epifanio Lesches, MD      .  Warfarin - Pharmacist Dosing Inpatient   Does not apply q1800 Pernell Dupre, [facility]         Discharge Medications: Please see discharge summary for a list of discharge medications.  Relevant Imaging Results:  Relevant Lab Results:   Additional Information Patient had surgical intervention on week ago for right hip fracture.  Katrina Stack RN   Patient's SS#  [id number]  Katrina Stack, [place]

## 2019-01-19 NOTE — Evaluation (Signed)
Occupational Therapy Evaluation Patient Details Name: [name] MRN: [id number] DOB: [birth year] Today's Date: 01/19/2019    History of Present Illness  84 y.o. female with a known history of right hip repair at [facility] on 01/11/2019 secondary to mechanical fall at home, paroxysmal atrial fibrillation, mitral valve replacement on Coumadin, COPD, CKD stage III, hypertension, history of left lower extremity hematoma with evacuation and porcine graft performed on 12/25/2018.   Clinical Impression   Pt is 84year old female s/p R hip repair at [facility] on 01/11/19 after a fall at home and transferred here to be closer to home.  She has painful BUEs and BLEs which limit participation in ADLs.  Pt was using AD at home prior to hip surgery and has a caregiver to help 8-12 Mon thru Fri and occasionally on weekends. Her daughter [name] also helps as needed and lives with patient.  She was participating in [place] 3 days a week at [facility] before she fell.  She currently requires max to total assist for ADLs due to pain and decreased AROM in BUEs (see below). She is currently limited in functional ADLs due to pain, decreased ROM, and decreased activity tolerance.  She would benefit from continued skilled OT services for education in assistive devices, functional mobility, and education in recommendations for home modifications to increase safety and prevent falls.  Pt is a good candidate for SNF to continue rehabilitation.      Follow Up Recommendations  SNF    Equipment Recommendations  Other (comment)(TBD)    Recommendations for Other Services       Precautions / Restrictions Precautions Precautions: Fall Restrictions Weight Bearing Restrictions: Yes RLE Weight Bearing: Weight bearing as tolerated      Mobility Bed Mobility                  Transfers                      Balance                                           ADL either  performed or assessed with clinical judgement   ADL Overall ADL's : Needs assistance/impaired Eating/Feeding: Total assistance;Bed level Eating/Feeding Details (indicate cue type and reason): Pt has been fed by NSG staff or by her daughter [name] due to weakness and decreased AROM in RUE Grooming: Wash/dry hands;Wash/dry face;Oral care;Bed level;Set up;Moderate assistance Grooming Details (indicate cue type and reason): Pt fatigues easily and pain in BUEs limits function and participation in ADLs         Upper Body Dressing : Bed level;Minimal assistance   Lower Body Dressing: Total assistance;Set up;Bed level Lower Body Dressing Details (indicate cue type and reason): B LEs painful and has several wounds to both legs which limit participation and awaiting consult from MD about them.               General ADL Comments: Pt is very weak with deconditioning and pain in BUEs and BLEs limits participation in ADLs and pain in legs is worse than from recent hop surgery.     Vision Baseline Vision/History: Wears glasses Patient Visual Report: No change from baseline       Perception     Praxis      Pertinent Vitals/Pain Pain Assessment: No/denies pain Pain Score: 7  Pain Location: 7/10 pain in B shoulders and 6/10 in B LEs at rest     Hand Dominance Right   Extremity/Trunk Assessment Upper Extremity Assessment Upper Extremity Assessment: Generalized weakness(BUEs limited to about 80 degrees for shoulder flexion and about 30 degrees IR/ER rotation; very weak in UEs 3/5  but good strength in hands 4/5)   Lower Extremity Assessment Lower Extremity Assessment: Defer to PT evaluation       Communication Communication Communication: No difficulties   Cognition Arousal/Alertness: Awake/alert Behavior During Therapy: WFL for tasks assessed/performed Overall Cognitive Status: Within Functional Limits for tasks assessed                                      General Comments       Exercises     Shoulder Instructions      Home Living Family/patient expects to be discharged to:: Skilled nursing facility Living Arrangements: Children                                      Prior Functioning/Environment Level of Independence: Needs assistance  Gait / Transfers Assistance Needed: uses rollator at baseline for household mobility ADL's / Homemaking Assistance Needed: an aide comes 5x a week, 8a-12p to assist with ADLs/IADLs and daughter helps as needed.            OT Problem List: Decreased strength;Pain;Decreased range of motion;Decreased activity tolerance;Impaired balance (sitting and/or standing)      OT Treatment/Interventions: Self-care/ADL training;Therapeutic exercise;Energy conservation;Patient/family education;DME and/or AE instruction    OT Goals(Current goals can be found in the care plan section) Acute Rehab OT Goals Patient Stated Goal: start doing more for myself again OT Goal Formulation: With patient Time For Goal Achievement: 02/02/19 Potential to Achieve Goals: Good ADL Goals Pt Will Perform Eating: with min assist;with adaptive utensils;sitting;with set-up Pt Will Perform Lower Body Dressing: with set-up;with adaptive equipment;sit to/from stand;with mod assist Pt Will Transfer to Toilet: with set-up;stand pivot transfer;with mod assist;bedside commode Pt/caregiver will Perform Home Exercise Program: Both right and left upper extremity;With written HEP provided;Increased strength  OT Frequency: Min 2X/week   Barriers to D/C:            Co-evaluation              AM-PAC OT "6 Clicks" Daily Activity     Outcome Measure Help from another person eating meals?: Total Help from another person taking care of personal grooming?: A Lot Help from another person toileting, which includes using toliet, bedpan, or urinal?: Total Help from another person bathing (including washing, rinsing, drying)?:  Total Help from another person to put on and taking off regular upper body clothing?: A Little Help from another person to put on and taking off regular lower body clothing?: Total 6 Click Score: 9   End of Session    Activity Tolerance: Patient tolerated treatment well Patient left: in bed;with call bell/phone within reach;with bed alarm set  OT Visit Diagnosis: Muscle weakness (generalized) (M62.81);History of falling (Z91.81);Pain Pain - Right/Left: Left(bilateral UEs and LEs) Pain - part of body: Leg;Shoulder                Time: 1400-1430 OT Time Calculation (min): 30 min Charges:  OT General Charges $OT Visit: 1 Visit OT Evaluation $OT Eval Low Complexity: 1  Low OT Treatments $Self Care/Home Management : 8-22 mins    Chrys Racer, OTR/L, [place] ascom [phone] 01/19/19, 2:57 PM

## 2019-01-19 NOTE — Evaluation (Signed)
Clinical/Bedside Swallow Evaluation Patient Details  Name: [name] MRN: [id number] Date of Birth: 10/16/1934  Today's Date: 01/19/2019 Time: SLP Start Time (ACUTE ONLY): 1430 SLP Stop Time (ACUTE ONLY): 1455 SLP Time Calculation (min) (ACUTE ONLY): 25 min  Past Medical History:  Past Medical History:  Diagnosis Date  . Arthritis    "back, hands, knees" (04/10/2016)  . Chronic back pain   . 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 EF 25% by echo in 08/2015 b. RHC in 08/2015 showed normal filling pressures; c. 10/2018 Echo (in setting of atrial tachycardia): EF 25-30%, impaired relaxation. Glob HK. RVSP 45mmHg. Mod dil RA/LA. Mech MV w/ nl fxn (mean grad 27mmHg). Mod TR.   Marland Kitchen Chronic kidney disease   . Compression fracture    "several; all in my back" (04/10/2016)  . GERD (gastroesophageal reflux disease)   . Hypertension   . Lymphedema   . Pacemaker 05/2017  . PAF (paroxysmal atrial fibrillation) ([place])    a. On amio & Coumadin - CHA2DS2VASc = 4.  . S/P MVR (mitral valve replacement)    a. MVR 1994 b. redo MVR in 08/2014 - on Coumadin  . Shortness of breath dyspnea    Past Surgical History:  Past Surgical History:  Procedure Laterality Date  . BREAST LUMPECTOMY Right 2005?   benign lump excision  . CARDIAC CATHETERIZATION    . [place], 2016   "MVR; MVR"  . CARDIOVERSION N/A 11/06/2018   Procedure: CARDIOVERSION;  Surgeon: Minna Merritts, MD;  Location: [facility] ORS;  Service: Cardiovascular;  Laterality: N/A;  . CATARACT EXTRACTION W/PHACO Right 02/20/2015   Procedure: CATARACT EXTRACTION PHACO AND INTRAOCULAR LENS PLACEMENT ([place]);  Surgeon: Estill Cotta, MD;  Location: [facility] OR[facility];  Service: Ophthalmology;  Laterality: Right;  US01:31.2 AP 25.3% CDE40.13 Fluid lot # [id number]  . DRESSING CHANGE UNDER ANESTHESIA Left 11/17/2018   Procedure: DRESSING CHANGE;  Surgeon: Robert Bellow, MD;   Location: [facility] OR[facility];  Service: General;  Laterality: Left;  no anesthesia  . ELECTROPHYSIOLOGIC STUDY N/A 10/09/2015   Procedure: CARDIOVERSION;  Surgeon: Wellington Hampshire, MD;  Location: [facility] OR[facility];  Service: Cardiovascular;  Laterality: N/A;  . INCISION AND DRAINAGE OF WOUND Left 11/13/2018   Procedure: LEFT LEG DRESSING CHANGE;  Surgeon: Robert Bellow, MD;  Location: [facility] OR[facility];  Service: General;  Laterality: Left;  . INCISION AND DRAINAGE OF WOUND Left 11/19/2018   Procedure: LEFT LEG DRESSING CHANGE AND GRAFT APPLICATION;  Surgeon: Robert Bellow, MD;  Location: [facility] OR[facility];  Service: General;  Laterality: Left;  . IR GENERIC HISTORICAL  04/01/2016   IR RADIOLOGIST EVAL & MGMT 04/01/2016 [facility]  . IR GENERIC HISTORICAL  04/15/2016   IR VERTEBROPLASTY CERV/THOR BX INC UNI/BIL INC/INJECT/IMAGING 04/15/2016 Luanne Bras, MD [facility]  . IR GENERIC HISTORICAL  04/15/2016   IR VERTEBROPLASTY EA ADDL (T&LS) BX INC UNI/BIL INC INJECT/IMAGING 04/15/2016 Luanne Bras, MD [facility]  . IR GENERIC HISTORICAL  04/15/2016   IR VERTEBROPLASTY EA ADDL (T&LS) BX INC UNI/BIL INC INJECT/IMAGING 04/15/2016 Luanne Bras, MD [facility]  . IR GENERIC HISTORICAL  05/20/2016   IR RADIOLOGIST EVAL & MGMT 05/20/2016 [facility]  . IR GENERIC HISTORICAL  06/13/2016   IR VERTEBROPLASTY EA ADDL (T&LS) BX INC UNI/BIL INC INJECT/IMAGING 06/13/2016 Luanne Bras, MD [facility]  . IR GENERIC HISTORICAL  06/13/2016   IR SACROPLASTY BILATERAL 06/13/2016 Luanne Bras, MD [facility]  . IR  GENERIC HISTORICAL  06/13/2016   IR VERTEBROPLASTY LUMBAR BX INC UNI/BIL INC/INJECT/IMAGING 06/13/2016 Luanne Bras, MD [facility]  . IRRIGATION AND DEBRIDEMENT HEMATOMA Left 07/05/2015   Procedure: IRRIGATION AND DEBRIDEMENT HEMATOMA;  Surgeon: Robert Bellow, MD;  Location: [facility];  Service: General;  Laterality: Left;  . IRRIGATION AND DEBRIDEMENT HEMATOMA Left 11/11/2018    Procedure: IRRIGATION AND DEBRIDEMENT LEFT LEG HEMATOMA;  Surgeon: Robert Bellow, MD;  Location: [facility];  Service: General;  Laterality: Left;  . pyloric stenosis  07/1937  . US ECHOCARDIOGRAPHY     HPI:      Assessment / Plan / Recommendation Clinical Impression  pt presents with a minimal risk of aspiration. pt was administered directly with solids including regular and soft solids. pt refused to attempt puree solids. pt had no overt ssx of aspiration with thin liquids or regular solids. pt was allowed to utilize a straw for intake. pt had poor labial seal and was cued to use straw and was able to get adequate seal.   pt had no overt ssx aspiration with semi solid bolus. She states she occasionally gets "choked" or coughs with taking medications at home. pt was educated to take half pill at a time at home if she is unable to take in puree foods.  pt was educated to follow up with NSG and to have them notify st if any changes occur. NSG notified and gave verbal agreement. NSG did not know reasoning for ST order also and states having no concerns for pt swallow. ST will follow up if needed. SLP Visit Diagnosis: Dysphagia, oropharyngeal phase (R13.12)    Aspiration Risk  Mild aspiration risk;No limitations    Diet Recommendation Regular;Dysphagia 1 (Puree)   Liquid Administration via: Straw;Cup Medication Administration: Whole meds with liquid Supervision: Patient able to self feed Compensations: Slow rate;Small sips/bites;Follow solids with liquid Postural Changes: Seated upright at 90 degrees    Other  Recommendations     Follow up Recommendations        Frequency and Duration            Prognosis Prognosis for Safe Diet Advancement: Good      Swallow Study   General Date of Onset: 01/19/19 Type of Study: Bedside Swallow Evaluation Diet Prior to this Study: Regular;Thin liquids Temperature Spikes Noted: No Respiratory Status: Room air History of Recent Intubation:  No Behavior/Cognition: Alert;Cooperative;Pleasant mood Oral Cavity Assessment: Within Functional Limits Oral Care Completed by SLP: No Oral Cavity - Dentition: Adequate natural dentition Vision: Functional for self-feeding Self-Feeding Abilities: Able to feed self Patient Positioning: Upright in bed Baseline Vocal Quality: Normal Volitional Cough: Strong Volitional Swallow: Able to elicit    Oral/Motor/Sensory Function Overall Oral Motor/Sensory Function: Within functional limits   Ice Chips Ice chips: Within functional limits Presentation: Spoon   Thin Liquid Thin Liquid: Within functional limits Presentation: Cup;Straw    Nectar Thick Nectar Thick Liquid: Not tested   Honey Thick Honey Thick Liquid: Not tested   Puree Puree: Within functional limits Presentation: Spoon   Solid     Solid: Within functional limits Presentation: Jessy Oto 01/19/2019,2:57 PM

## 2019-01-19 NOTE — Progress Notes (Signed)
Patient continues with Foley catheter at this time, the catheter was placed at [facility] prior to transfer on 7/19 due to aggressive diuresis via lasix gtt from what this RN can tell. This RN educated patient and daughter on the danger of infection posed by continued use of the foley catheter, advised them that it is a nurse driven protocol and there is really no reason for the patient to continue with the foley due to increase chance of infection, offered use of [place] instead due to patient decreased mobility at this time, but daughter and patient declined refusing foley catheter removal at this time.

## 2019-01-19 NOTE — NC FL2 (Deleted)
[place] LEVEL OF CARE SCREENING TOOL     IDENTIFICATION  Patient Name: [name] Birthdate: 06-Aug-1934 Sex: female Admission Date (Current Location): 01/26/2019  [place] and [place] Number:  Engineering geologist and Address:  [facility], [address], [place], [place] [address]      Provider Number: [id number]  Attending Physician Name and Address:  Epifanio Lesches, MD  Relative Name and Phone Number:  Delorse Lek    Current Level of Care: Hospital Recommended Level of Care: [place] Prior Approval Number:    Date Approved/Denied:   PASRR Number: [id number]  Discharge Plan: SNF    Current Diagnoses: Patient Active Problem List   Diagnosis Date Noted  . Hip fracture requiring operative repair ([place]) 01/27/2019  . GERD (gastroesophageal reflux disease) 12/25/2018  . Closed right hip fracture ([place]) 12/25/2018  . Hip fracture ([place]) 12/25/2018  . Goals of care, counseling/discussion   . Palliative care by specialist   . Wound of left leg   . Persistent atrial fibrillation   . Atrial tachycardia ([place])   . Chronic kidney disease (CKD), stage IV (severe) ([place])   . Chest pain of uncertain etiology   . Traumatic hematoma of left lower leg 11/09/2018  . Acute exacerbation of CHF (congestive heart failure) ([place]) 11/04/2018  . Pulmonary hypertension, unspecified ([place]) 11/04/2018  . S/P MVR (mitral valve replacement) 11/04/2018  . Encounter for therapeutic drug monitoring 09/09/2017  . Encounter for anticoagulation discussion and counseling 09/09/2017  . PAF (paroxysmal atrial fibrillation) ([place]) 02/25/2017  . Chronic venous insufficiency 10/27/2016  . Shortness of breath 08/14/2016  . Pressure injury of skin 04/12/2016  . Malnutrition of moderate degree 04/11/2016  . Vertebral compression fracture ([place]) 04/10/2016  . Intractable pain 04/10/2016  . Spinal compression fracture ([place]) 04/10/2016  .  Acute on chronic systolic heart failure ([place]) 11/19/2015  . Lymphedema   . Bilateral lower extremity edema 08/30/2015  . Acute on chronic renal failure ([place]) 04/28/2015  . Hyponatremia 04/28/2015  . History of mitral valve replacement with mechanical valve 11/04/2014  . Long term current use of anticoagulant therapy 09/28/2014  . Chronic combined systolic and diastolic CHF  01/00/7121  . 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 Generalized OA 11/28/2013  . HLD (hyperlipidemia) 11/28/2013    Orientation RESPIRATION BLADDER Height & Weight     Self, Time, Situation  O2 Continent Weight: 53.3 kg Height:  4\' 11"  (149.9 cm)  BEHAVIORAL SYMPTOMS/MOOD NEUROLOGICAL BOWEL NUTRITION STATUS    (None) Continent Diet  AMBULATORY STATUS COMMUNICATION OF NEEDS Skin   Extensive Assist Verbally Surgical wounds, Other (Comment)(Hematomas lower extremities. Wound care protocol is currently iin development)                       Personal Care Assistance Level of Assistance  Bathing, Feeding, Dressing Bathing Assistance: Maximum assistance Feeding assistance: Limited assistance Dressing Assistance: Limited assistance     Functional Limitations Info             [employer]  OT (By licensed OT), PT (By licensed PT)     PT Frequency: daily OT Frequency: daily            Contractures Contractures Info: Not present    Additional Factors Info  Code Status Code Status Info: DNR             Current Medications (01/19/2019):  This is the current hospital active medication  list Current Facility-Administered Medications  Medication Dose Route Frequency Provider Last Rate Last Dose  . acetaminophen (TYLENOL) suppository 650 mg  650 mg Rectal Q6H PRN Seals, [name] Dills, NP      . acetaminophen (TYLENOL) tablet 500 mg  500 mg Oral Q6H Seals, Angela H, NP   500 mg at 01/19/19 0529  . acetaminophen (TYLENOL) tablet 650 mg  650 mg Oral Q6H PRN Gardiner Barefoot H, NP    650 mg at 01/19/19 0939  . amiodarone (PACERONE) tablet 200 mg  200 mg Oral Daily Seals, Angela H, NP   200 mg at 01/19/19 0940  . docusate sodium (COLACE) capsule 100 mg  100 mg Oral BID Gardiner Barefoot H, NP   100 mg at 01/19/19 0940  . feeding supplement (ENSURE ENLIVE) (ENSURE ENLIVE) liquid 237 mL  237 mL Oral BID BM Epifanio Lesches, MD      . furosemide (LASIX) tablet 20 mg  20 mg Oral BID Epifanio Lesches, MD      . ipratropium-albuterol (DUONEB) 0.5-2.5 (3) MG/3ML nebulizer solution 3 mL  3 mL Nebulization Q6H PRN Seals, [name] Dills, NP      . MEDLINE mouth rinse  15 mL Mouth Rinse BID Gardiner Barefoot H, NP   15 mL at 01/19/19 0942  . Melatonin TABS 5 mg  5 mg Oral QHS PRN Seals, [name] Dills, NP      . menthol-cetylpyridinium (CEPACOL) lozenge 3 mg  1 lozenge Oral PRN Seals, [name] Dills, NP       Or  . phenol (CHLORASEPTIC) mouth spray 1 spray  1 spray Mouth/Throat PRN Seals, [name] Dills, NP      . metoCLOPramide (REGLAN) tablet 5-10 mg  5-10 mg Oral Q8H PRN Seals, [name] Dills, NP       Or  . metoCLOPramide (REGLAN) injection 5-10 mg  5-10 mg Intravenous Q8H PRN Seals, [name] Dills, NP      . multivitamin-lutein (OCUVITE-LUTEIN) capsule 1 capsule  1 capsule Oral Daily Seals, [name] Dills, NP   1 capsule at 01/19/19 0939  . nystatin (MYCOSTATIN/NYSTOP) topical powder   Topical TID PRN Epifanio Lesches, MD      . ondansetron (ZOFRAN) tablet 4 mg  4 mg Oral Q6H PRN Seals, [name] Dills, NP       Or  . ondansetron (ZOFRAN) injection 4 mg  4 mg Intravenous Q6H PRN Seals, [name] Dills, NP      . pantoprazole (PROTONIX) EC tablet 40 mg  40 mg Oral Daily Seals, Angela H, NP   40 mg at 01/19/19 0940  . sodium chloride flush (NS) 0.9 % injection 3 mL  3 mL Intravenous Q12H Seals, Angela H, NP   3 mL at 01/19/19 0940  . [START ON 01/20/2019] vitamin C (ASCORBIC ACID) tablet 500 mg  500 mg Oral Daily Epifanio Lesches, MD      . warfarin (COUMADIN) tablet 2 mg  2 mg Oral ONCE-1800 Epifanio Lesches, MD      .  Warfarin - Pharmacist Dosing Inpatient   Does not apply q1800 Pernell Dupre, [facility]         Discharge Medications: Please see discharge summary for a list of discharge medications.  Relevant Imaging Results:  Relevant Lab Results:   Additional Information Katrina Stack RN   Patient's SS#  [id number]  Katrina Stack, [place]

## 2019-01-19 NOTE — TOC Initial Note (Addendum)
Transition of Care [facility]) - Initial/Assessment Note    Patient Details  Name: [name] MRN: [id number] Date of Birth: Mar 25, 1935  Transition of Care [facility]) CM/SW Contact:    Katrina Stack, RN Phone Number: 01/19/2019, 6:05 PM  Clinical Narrative:                Patient transferred to [facility] from [facility] where she underwent a high risk hemiarthroplasty one week ago.  Currently open to [facility] for RN and PT.  At present, physical therapy is recommending skilled nursing facility. Daughter [name] at bedside and is willing to consider if it is needed.  The only facility they would accept is [facility]- as they had a very positive experience there recently.  If choses to return home, would want [facility] to resume care. Would need hospital bed.  Heads up to [facility] and have entered the medical necessity. Heads up to [facility] for [place]. FL2 completed and sent for signature and to the hub.  Patient has some wound issues on lower extremities from hematomas that have required evacuation in the recent past. If discharges to  facility- will need covid test.   Expected Discharge Plan: (home with home health vs SNF)     Patient Goals and CMS Choice Patient states their goals for this hospitalization and ongoing recovery are:: Be able to walk      Expected Discharge Plan and Services Expected Discharge Plan: (home with home health vs SNF)   Discharge Planning Services: CM Consult   Living arrangements for the past 2 months: [facility] settiings)                 DME Arranged: (Heads up to [facility] with [facility] for hospital bed. Have entered medical necissity for the bed) DME Agency: [facility]     Representative spoke with at DME Agency: [place] Arranged: RN, PT, OT, Nurse's Aide [place] Agency: [place] ([facility]) Date [place]: 01/19/19      Prior Living Arrangements/Services Living arrangements for the past 2 months: [facility] settiings) Lives with:: Self Patient  language and need for interpreter reviewed:: Yes Do you feel safe going back to the place where you live?: Yes      Need for Family Participation in Patient Care: Yes (Comment) Care giver support system in place?: Yes (comment) Current home services: Home PT, Home RT Criminal Activity/Legal Involvement Pertinent to Current Situation/Hospitalization: No - Comment as needed  Activities of Daily Living Home Assistive Devices/Equipment: Other (Comment) ADL Screening (condition at time of admission) Patient's cognitive ability adequate to safely complete daily activities?: Yes Is the patient deaf or have difficulty hearing?: No Does the patient have difficulty seeing, even when wearing glasses/contacts?: No Does the patient have difficulty concentrating, remembering, or making decisions?: No Patient able to express need for assistance with ADLs?: Yes Does the patient have difficulty dressing or bathing?: Yes Independently performs ADLs?: No Communication: Independent Dressing (OT): Needs assistance Is this a change from baseline?: Pre-admission baseline Grooming: Needs assistance Is this a change from baseline?: Pre-admission baseline Feeding: Independent Bathing: Needs assistance Is this a change from baseline?: Pre-admission baseline Toileting: Needs assistance Is this a change from baseline?: Pre-admission baseline In/Out Bed: Needs assistance Is this a change from baseline?: Pre-admission baseline Walks in Home: Needs assistance Is this a change from baseline?: Pre-admission baseline Does the patient have difficulty walking or climbing stairs?: Yes Weakness of Legs: Both Weakness of Arms/Hands: Both  Permission Sought/Granted  Permission granted to share information with : Yes, Verbal Permission Granted              Emotional Assessment Appearance:: Appears older than stated age Attitude/Demeanor/Rapport: Lethargic(Quiet) Affect (typically observed):  (quiet) Orientation: : Oriented to Self, Oriented to Place, Oriented to  Time, Oriented to Situation Alcohol / Substance Use: Not Applicable Psych Involvement: No (comment)  Admission diagnosis:  Heart Failure Patient Active Problem List   Diagnosis Date Noted  . Hip fracture requiring operative repair ([place]) 01/12/2019  . GERD (gastroesophageal reflux disease) 12/25/2018  . Closed right hip fracture ([place]) 12/25/2018  . Hip fracture ([place]) 12/25/2018  . Goals of care, counseling/discussion   . Palliative care by specialist   . Wound of left leg   . Persistent atrial fibrillation   . Atrial tachycardia ([place])   . Chronic kidney disease (CKD), stage IV (severe) ([place])   . Chest pain of uncertain etiology   . Traumatic hematoma of left lower leg 11/09/2018  . Acute exacerbation of CHF (congestive heart failure) ([place]) 11/04/2018  . Pulmonary hypertension, unspecified ([place]) 11/04/2018  . S/P MVR (mitral valve replacement) 11/04/2018  . Encounter for therapeutic drug monitoring 09/09/2017  . Encounter for anticoagulation discussion and counseling 09/09/2017  . PAF (paroxysmal atrial fibrillation) ([place]) 02/25/2017  . Chronic venous insufficiency 10/27/2016  . Shortness of breath 08/14/2016  . Pressure injury of skin 04/12/2016  . Malnutrition of moderate degree 04/11/2016  . Vertebral compression fracture ([place]) 04/10/2016  . Intractable pain 04/10/2016  . Spinal compression fracture ([place]) 04/10/2016  . Acute on chronic systolic heart failure ([place]) 11/19/2015  . Lymphedema   . Bilateral lower extremity edema 08/30/2015  . Acute on chronic renal failure ([place]) 04/28/2015  . Hyponatremia 04/28/2015  . History of mitral valve replacement with mechanical valve 11/04/2014  . Long term current use of anticoagulant therapy 09/28/2014  . Chronic combined systolic and diastolic CHF  33/54/5625  . 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 Generalized OA 11/28/2013  . HLD (hyperlipidemia)  11/28/2013   PCP:  Rusty Aus, MD Pharmacy:   [place], [place] - [address] [place] [address] Phone: [phone] Fax: [phone]     Social Determinants of Health (SDOH) Interventions    Readmission Risk Interventions Readmission Risk Prevention Plan 12/26/2018 12/07/2018 11/30/2018  Transportation Screening Complete Complete Complete  PCP or Specialist Appt within 3-5 Days - Complete -  HRI or Home Care Consult Complete Complete -  Social Work Consult for [place] Planning/Counseling Complete Not Complete -  SW consult not completed comments - NA -  Palliative Care Screening - Not Applicable -  Medication Review Press photographer) Complete Complete Complete  PCP or Specialist appointment within 3-5 days of discharge - - Complete  HRI or [place] - - Complete  SW Recovery Care/Counseling Consult - - Not Complete  SW Consult Not Complete Comments - - na  Palliative Care Screening - - Not [place] - - Complete  Some recent data might be hidden

## 2019-01-19 NOTE — Consult Note (Signed)
[place] Nurse wound consult note Patient receiving care in [facility] 243 Reason for Consult: BLE hematomas I spoke with Dr. Marla Roe by telephone at [phone].  She is unable to physically see the patient this week, but she can do a FaceTime visit with the daughter tomorrow if the daughter is present.  I will be speaking with her primary RN, Caryl Pina shortly to try to arrange this with the daughter.  Val Riles, RN, MSN, CWOCN, CNS-BC, pager [phone]

## 2019-01-19 NOTE — Care Management (Signed)
[place]  Patient suffers from acute on chronic respiratory failure due to chf and has trouble breathing at night when head is elevated less  than 30  degrees. Bed wedges do not provide enough elevation to resolve breathing issues. Shortness of breath, chronic pain, wound on lower extremities from hematoma cause patient to require frequent changes in body position which cannot be achieved with a normal bed.

## 2019-01-20 LAB — PROTIME-INR
INR: 2.8 — ABNORMAL HIGH (ref 0.8–1.2)
Prothrombin Time: 29.3 seconds — ABNORMAL HIGH (ref 11.4–15.2)

## 2019-01-20 LAB — BASIC METABOLIC PANEL
Anion gap: 9 (ref 5–15)
BUN: 70 mg/dL — ABNORMAL HIGH (ref 8–23)
CO2: 42 mmol/L — ABNORMAL HIGH (ref 22–32)
Calcium: 8.6 mg/dL — ABNORMAL LOW (ref 8.9–10.3)
Chloride: 83 mmol/L — ABNORMAL LOW (ref 98–111)
Creatinine, Ser: 1.54 mg/dL — ABNORMAL HIGH (ref 0.44–1.00)
GFR calc Af Amer: 36 mL/min — ABNORMAL LOW (ref >60)
GFR calc non Af Amer: 31 mL/min — ABNORMAL LOW (ref >60)
Glucose, Bld: 92 mg/dL (ref 70–99)
Potassium: 3.5 mmol/L (ref 3.5–5.1)
Sodium: 134 mmol/L — ABNORMAL LOW (ref 135–145)

## 2019-01-20 LAB — CBC
HCT: 25.2 % — ABNORMAL LOW (ref 36.0–46.0)
Hemoglobin: 7.5 g/dL — ABNORMAL LOW (ref 12.0–15.0)
MCH: 28 pg (ref 26.0–34.0)
MCHC: 29.8 g/dL — ABNORMAL LOW (ref 30.0–36.0)
MCV: 94 fL (ref 80.0–100.0)
Platelets: 181 10*3/uL (ref 150–400)
RBC: 2.68 MIL/uL — ABNORMAL LOW (ref 3.87–5.11)
RDW: 20.8 % — ABNORMAL HIGH (ref 11.5–15.5)
WBC: 8.6 10*3/uL (ref 4.0–10.5)
nRBC: 0 % (ref 0.0–0.2)

## 2019-01-20 LAB — NOVEL CORONAVIRUS, NAA (HOSP ORDER, SEND-OUT TO REF LAB; TAT 18-24 HRS): SARS-CoV-2, NAA: NOT DETECTED

## 2019-01-20 MED ORDER — WARFARIN SODIUM 1 MG PO TABS
1.0000 mg | ORAL_TABLET | Freq: Once | ORAL | Status: AC
  Administered 2019-01-20: 1 mg via ORAL
  Filled 2019-01-20: qty 1

## 2019-01-20 MED ORDER — CALCIUM CARBONATE ANTACID 500 MG PO CHEW: 1.0000 | CHEWABLE_TABLET | Freq: Four times a day (QID) | ORAL | Status: DC | PRN

## 2019-01-20 NOTE — Progress Notes (Signed)
Physical Therapy Treatment Patient Details Name: [name] MRN: [id number] DOB: December 27, 1934 Today's Date: 01/20/2019    History of Present Illness  84 y.o. female with a known history of right hip repair at [facility] on 01/11/2019 secondary to mechanical fall at home, paroxysmal atrial fibrillation, mitral valve replacement on Coumadin, COPD, CKD stage III, hypertension, history of left lower extremity hematoma with evacuation and porcine graft performed on 12/25/2018.    PT Comments    Pt lethargic, but agreeable to PT; daughter present. Pt with moderate pain throughout BLEs. Pt requires care with assistance throughout LEs to avoid further pain; tolerates touch assist at heels only currently. Education provided on benefits of range of motion/low level strengthening for healing and avoiding stiffness pain. Pt and daughter educated and assisted on supine level exercises; written HEP provided and all questions answered to their satisfaction. Pt does show good effort with cues/education. Continue PT to progress range, strength, endurance to improve all functional mobility.   Follow Up Recommendations  SNF     Equipment Recommendations       Recommendations for Other Services       Precautions / Restrictions Precautions Precautions: Fall Restrictions Weight Bearing Restrictions: Yes RLE Weight Bearing: Weight bearing as tolerated    Mobility  Bed Mobility               General bed mobility comments: Not tested  Transfers                    Ambulation/Gait                 Stairs             Wheelchair Mobility    Modified Rankin (Stroke Patients Only)       Balance                                            Cognition Arousal/Alertness: Lethargic Behavior During Therapy: WFL for tasks assessed/performed Overall Cognitive Status: Within Functional Limits for tasks assessed                                 General Comments: Pt with multiple hematomas and sore areas throughout BLEs; use caution to assist       Exercises General Exercises - Lower Extremity Ankle Circles/Pumps: AROM;Both;15 reps;AAROM(perfomed 1 LE at a time with best outcome) Quad Sets: Strengthening;Both;10 reps Gluteal Sets: Strengthening;Both;10 reps Short Arc QuadSinclair Ship;Both;10 reps Heel Slides: AAROM;Both;10 reps Hip ABduction/ADduction: AAROM;Both;10 reps Straight Leg Raises: AAROM;Left;10 reps;Other (comment)(PROM x 5 reps on R) Other Exercises Other Exercises: Pt and daughter educated on exercises regarding technique, assist, frequency, level of work and benefits. Other Exercises: Repositioned BLEs in neutral versus BLEs rotated toward the L.  Other Exercises: Given written HEP    General Comments        Pertinent Vitals/Pain Pain Assessment: Faces Faces Pain Scale: Hurts even more Pain Location: R hip and throughout BLEs if touched, other than at heels Pain Intervention(s): Limited activity within patient's tolerance;Premedicated before session    Home Living                      Prior Function            PT Goals (current goals  can now be found in the care plan section) Progress towards PT goals: Progressing toward goals(slowly)    Frequency    Min 2X/week      PT Plan Current plan remains appropriate    Co-evaluation              AM-PAC PT "6 Clicks" Mobility   Outcome Measure  Help needed turning from your back to your side while in a flat bed without using bedrails?: Total Help needed moving from lying on your back to sitting on the side of a flat bed without using bedrails?: Total Help needed moving to and from a bed to a chair (including a wheelchair)?: Total Help needed standing up from a chair using your arms (e.g., wheelchair or bedside chair)?: Total Help needed to walk in hospital room?: Total Help needed climbing 3-5 steps with a railing? : Total 6  Click Score: 6    End of Session   Activity Tolerance: Patient limited by fatigue;Patient limited by pain Patient left: in bed;with call bell/phone within reach;with bed alarm set;with family/visitor present   PT Visit Diagnosis: Muscle weakness (generalized) (M62.81);Difficulty in walking, not elsewhere classified (R26.2);Pain Pain - Right/Left: Right Pain - part of body: Hip     Time: 2446-2863 PT Time Calculation (min) (ACUTE ONLY): 34 min  Charges:  $Therapeutic Exercise: 23-37 mins                      Larae Grooms, PTA 01/20/2019, 3:27 PM

## 2019-01-20 NOTE — Consult Note (Signed)
[place] Nurse wound consult note Patient receiving care in [facility] 243. Reason for Consult: BLE hematomas Wound type: hematomas on the RLE, surgical graft on the LLE from extensive hematoma Wound bed: minimla Drainage (amount, consistency, odor) scant drops of old bloody on existing Xeroform gauze dressings Periwound: Very fragile, but intact Dressing procedure/placement/frequency: Daily removal of existing dressings with LIBERAL amounts of saline, cover all areas with large Xeroform gauzes, place ABD pads to the upper leg wounds on the right, and the hematoma on the left. Secure with spiral wrapped gauze.  NO tape on the skin.  Change daily.  This is the dressing I performed today, therefore dressing changes by bedside RNs to begin 01/21/19.   Patient tolerated well.  Daughter, [name], present for dressing placement.  Unfortunately, video consultation with Dr. Marla Roe not able to be completed due to her surgery scheduled.  Dr. Marla Roe did speak with [place] by phone at the time of my visit. Use of out of facility provided waffle boots to continue. Monitor the wound area(s) for worsening of condition such as: Signs/symptoms of infection,  Increase in size,  Development of or worsening of odor, Development of pain, or increased pain at the affected locations.  Notify the medical team if any of these develop.  Thank you for the consult.  Discussed plan of care with the patient and bedside nurse.  [place] nurse will not follow at this time.  Please re-consult the [place] team if needed.  Val Riles, RN, MSN, CWOCN, CNS-BC, pager [phone]

## 2019-01-20 NOTE — Progress Notes (Signed)
RN assisted wound nurse with dressing changes to bilateral legs. No measurements taken by shift RN at this time. Left thigh hematoma was draining a small amount of blood. Wound care instructions are placed by wound nurse. I will continue to assess.

## 2019-01-20 NOTE — Progress Notes (Signed)
Notify Dr. Vianne Bulls about patient's complaints of indigestion, family is requesting for TUMS, order given. RN will continue to monitor.

## 2019-01-20 NOTE — Progress Notes (Signed)
Patient ID: [name], female   DOB: [birth year], 84 y.o.   MRN: [id number]  [employer] PROGRESS NOTE  KENNETTA PAVLOVIC POE:[id number] DOB: 08-23-1934 DOA: 01/23/2019 PCP: Rusty Aus, MD  HPI/Subjective: Patient not feeling well.  Cannot describe how she is feeling or why she has been feeling well.  Patient has constipation and cannot go on the bedpan.  Does not want an enema or a suppository.  Objective: Vitals:   01/20/19 0724 01/20/19 1550  BP: 105/63 (!) 106/44  Pulse: (!) 59 61  Resp: 19 19  Temp: 98.4 F (36.9 C) 98.3 F (36.8 C)  SpO2: 100% 99%    Intake/Output Summary (Last 24 hours) at 01/20/2019 1624 Last data filed at 01/20/2019 1553 Gross per 24 hour  Intake -  Output 950 ml  Net -950 ml   Filed Weights   12/30/2018 2008 01/20/19 0500  Weight: 53.3 kg 54.1 kg    ROS: Review of Systems  Constitutional: Negative for chills and fever.  Eyes: Negative for blurred vision.  Respiratory: Negative for cough and shortness of breath.   Cardiovascular: Negative for chest pain.  Gastrointestinal: Positive for constipation. Negative for abdominal pain, diarrhea, nausea and vomiting.  Genitourinary: Negative for dysuria.  Musculoskeletal: Positive for joint pain.  Neurological: Negative for dizziness and headaches.   Exam: Physical Exam  HENT:  Nose: No mucosal edema.  Mouth/Throat: No oropharyngeal exudate or posterior oropharyngeal edema.  Eyes: Pupils are equal, round, and reactive to light. Conjunctivae, EOM and lids are normal.  Neck: No JVD present. Carotid bruit is not present. No edema present. No thyroid mass and no thyromegaly present.  Cardiovascular: S1 normal and S2 normal. Exam reveals no gallop.  Murmur heard.  Systolic murmur is present with a grade of 3/6. Pulses:      Dorsalis pedis pulses are 2+ on the right side and 2+ on the left side.  Respiratory: No respiratory distress. She has decreased breath sounds in the right lower  field and the left lower field. She has no wheezes. She has no rhonchi. She has no rales.  GI: Soft. Bowel sounds are normal. There is no abdominal tenderness.  Musculoskeletal:     Right ankle: She exhibits swelling.     Left ankle: She exhibits swelling.  Lymphadenopathy:    She has no cervical adenopathy.  Neurological: She is alert. No cranial nerve deficit.  Skin: Skin is warm. Nails show no clubbing.  Legs were wrapped.  As per nursing staff have a hematoma left thigh  Psychiatric: She has a normal mood and affect.      Data Reviewed: Basic Metabolic Panel: Recent Labs  Lab 01/19/19 0026 01/20/19 0508  NA 136 134*  K 3.7 3.5  CL 83* 83*  CO2 43* 42*  GLUCOSE 99 92  BUN 72* 70*  CREATININE 1.63* 1.54*  CALCIUM 8.8* 8.6*   CBC: Recent Labs  Lab 01/19/19 0026 01/20/19 0508  WBC 9.1 8.6  HGB 8.5* 7.5*  HCT 28.8* 25.2*  MCV 95.4 94.0  PLT 207 181   BNP (last 3 results) Recent Labs    11/04/18 1141 12/04/18 1627 12/04/18 1954  BNP 505.0* 1,189.0* 992.0*     Recent Results (from the past 240 hour(s))  Novel Coronavirus,NAA,(SEND-OUT TO REF LAB - TAT 24-48 hrs); Hosp Order     Status: None   Collection Time: 01/19/19 12:10 AM   Specimen: Nasopharyngeal Swab; Respiratory  Result Value Ref Range Status   SARS-CoV-2, NAA NOT  DETECTED NOT DETECTED Final    Comment: (NOTE) This test was developed and its performance characteristics determined by [employer]. This test has not been FDA cleared or approved. This test has been authorized by FDA under an Emergency Use Authorization (EUA). This test is only authorized for the duration of time the declaration that circumstances exist justifying the authorization of the emergency use of in vitro diagnostic tests for detection of SARS-CoV-2 virus and/or diagnosis of COVID-19 infection under section 564(b)(1) of the Act, 21 U.S.C. 030SPQ-3(R)(0), unless the authorization is terminated or revoked sooner.  When diagnostic testing is negative, the possibility of a false negative result should be considered in the context of a patient's recent exposures and the presence of clinical signs and symptoms consistent with COVID-19. An individual without symptoms of COVID-19 and who is not shedding SARS-CoV-2 virus would expect to have a negative (not detected) result in this assay. Performed  At: [facility] [place], [place] [id number] Rush Farmer MD LK:[id number]    [place]  Final    Comment: Performed at [facility], [address] [place], [place] [address]     Studies: Dg Chest [facility] 1 View  Result Date: 01/19/2019 CLINICAL DATA:  Dyspnea. EXAM: PORTABLE CHEST 1 VIEW COMPARISON:  Radiograph 01/05/2019 FINDINGS: Left-sided pacemaker remains in place. Post median sternotomy. Prostatic valve. Cardiomegaly is unchanged. Mediastinal contours unchanged. Improved bilateral pulmonary opacities. Residual bilateral perihilar reticular opacities. Linear opacity in the right mid lung may be fluid in the fissure or atelectasis/airspace disease. Retrocardiac opacity with possible left pleural effusion. Vertebral augmentation in the midthoracic spine. IMPRESSION: 1. Improved bilateral pulmonary opacities since prior exam. Residual reticular opacities may represent pulmonary edema or residual infection. 2. Possible left pleural effusion and basilar consolidation. 3. Right midlung atelectasis or fluid in the fissure. Electronically Signed   By: Keith Rake M.D.   On: 01/19/2019 03:16    Scheduled Meds: . amiodarone  200 mg Oral Daily  . docusate sodium  100 mg Oral BID  . feeding supplement (ENSURE ENLIVE)  237 mL Oral BID BM  . furosemide  20 mg Oral BID  . mouth rinse  15 mL Mouth Rinse BID  . multivitamin-lutein  1 capsule Oral Daily  . sodium chloride flush  3 mL Intravenous Q12H  . vitamin C  500 mg Oral Daily  . warfarin  1 mg Oral  ONCE-1800  . Warfarin - Pharmacist Dosing Inpatient   Does not apply q1800   Continuous Infusions:  Assessment/Plan:  1. Acute on chronic combined systolic and diastolic heart failure.  Off Lasix drip and on Lasix orally.  Currently blood pressure little too low for any other medications. 2. Paroxysmal atrial fibrillation and history of mitral valve replacement on amiodarone and Coumadin 3. Leg hematomas and acute on chronic anemia.  Hemoglobin 7.5 today may end up needing another transfusion prior to disposition. 4. Hip fracture status post operative repair.  Physical therapy recommending rehab. 5. Weakness.  Physical therapy evaluation 6. Chronic kidney disease stage III.  Watch with diuresis  Code Status:     Code Status Orders  (From admission, onward)         Start     Ordered   01/19/19 0004  Do not attempt resuscitation (DNR)  Continuous    Question Answer Comment  In the event of cardiac or respiratory ARREST Do not call a "code blue"   In the event of cardiac or respiratory ARREST Do  not perform Intubation, CPR, defibrillation or ACLS   In the event of cardiac or respiratory ARREST Use medication by any route, position, wound care, and other measures to relive pain and suffering. May use oxygen, suction and manual treatment of airway obstruction as needed for comfort.      01/19/19 0003        Code Status History    Date Active Date Inactive Code Status Order ID Comments User Context   01/26/2019 2311 01/19/2019 0003 Full Code [id number]  Mayer Camel, NP Inpatient   01/08/2019 2309 01/14/2019 2311 Full Code [id number]  Mayer Camel, NP Inpatient   12/30/2018 1220 01/06/2019 0608 DNR [id number]  Ottie Glazier, MD Inpatient   12/26/2018 0147 12/30/2018 1220 Full Code [id number]  Lance Coon, MD Inpatient   12/04/2018 1937 12/15/2018 1643 Full Code [id number]  Mayer Camel, NP Inpatient   11/24/2018 1621 11/30/2018 2027 Full Code [id number]  Fritzi Mandes, MD Inpatient   11/24/2018  1557 11/24/2018 1620 DNR [id number]  Fritzi Mandes, MD Inpatient   11/09/2018 0333 11/24/2018 1557 Full Code [id number]  Mayer Camel, NP ED   11/04/2018 1650 11/08/2018 2006 Full Code [id number]  Saundra Shelling, MD Inpatient   04/10/2016 2034 04/19/2016 1757 Full Code [id number]  Vianne Bulls, MD Inpatient   09/29/2015 1053 10/10/2015 1406 Full Code [id number]  [name], MD Inpatient   04/28/2015 1626 04/30/2015 1422 Full Code [id number]  Demetrios Loll, MD Inpatient   Advance Care Planning Activity    Advance Directive Documentation     Most Recent Value  Type of Advance Directive  Out of facility DNR (pink MOST or yellow form)  Pre-existing out of facility DNR order (yellow form or pink MOST form)  Yellow form placed in chart (order not valid for inpatient use)  "MOST" Form in Place?  -     Family Communication: Daughter at the bedside Disposition Plan: To be determined based on clinical course  Time spent: 28 minutes  [place]

## 2019-01-20 NOTE — Consult Note (Addendum)
Addendum: Agree with note below. Will give warfarin 1 mg tonight and continue to monitor CBC.   Eleonore Chiquito, PharmD, BCPS  ANTICOAGULATION CONSULT NOTE  Pharmacy Consult for Warfarin Dosing  Indication: Mitral Valve Replacement, A. Fib    Allergies  Allergen Reactions  . Ace Inhibitors     Cough  . Doxycycline Nausea Only  . Hydrocodone Itching and Nausea Only  . Mercury     Other reaction(s): Unknown  . Silver Dermatitis    Severe itching  . Cephalexin Rash  . Clindamycin/Lincomycin Rash  . Nickel Rash  . Penicillins Rash    Has patient had a PCN reaction causing immediate rash, facial/tongue/throat swelling, SOB or lightheadedness with hypotension: YES Has patient had a PCN reaction causing severe rash involving mucus membranes or skin necrosis: NO Has patient had a PCN reaction that required hospitalization NO Has patient had a PCN reaction occurring within the last 10 years: NO If all of the above answers are "NO", then may proceed with Cephalosporin use.    Patient Measurements: Height: 4\' 11"  (149.9 cm) Weight: 119 lb 3.2 oz (54.1 kg) IBW/kg (Calculated) : 43.2   Vital Signs: Temp: 98.4 F (36.9 C) (07/22 0724) Temp Source: Oral (07/22 0724) BP: 105/63 (07/22 0724) Pulse Rate: 59 (07/22 0724)  Labs: Recent Labs    01/19/19 0026 01/19/19 1222 01/20/19 0508  HGB 8.5*  --  7.5*  HCT 28.8*  --  25.2*  PLT 207  --  181  LABPROT 27.6* 28.4* 29.3*  INR 2.6* 2.7* 2.8*  CREATININE 1.63*  --  1.54*    Estimated Creatinine Clearance: 21.5 mL/min (A) (by C-G formula based on SCr of 1.54 mg/dL (H)).   Medical History: Past Medical History:  Diagnosis Date  . Arthritis    "back, hands, knees" (04/10/2016)  . Chronic back pain   . 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 EF 25% by echo in 08/2015 b. RHC in 08/2015 showed normal filling pressures; c. 10/2018 Echo (in setting of atrial tachycardia): EF 25-30%,  impaired relaxation. Glob HK. RVSP 72mmHg. Mod dil RA/LA. Mech MV w/ nl fxn (mean grad 44mmHg). Mod TR.   Marland Kitchen Chronic kidney disease   . Compression fracture    "several; all in my back" (04/10/2016)  . GERD (gastroesophageal reflux disease)   . Hypertension   . Lymphedema   . Pacemaker 05/2017  . PAF (paroxysmal atrial fibrillation) ([place])    a. On amio & Coumadin - CHA2DS2VASc = 4.  . S/P MVR (mitral valve replacement)    a. MVR 1994 b. redo MVR in 08/2014 - on Coumadin  . Shortness of breath dyspnea    Assessment: Pharmacy consulted for warfarin dosing and monitoring for 84 yo female with PMH of mitral valve replacement and A. Fib. Patient transferred to [facility] 7/20 PM from [place] INR DOSE 7/17  2mg  7/18 1.6 3mg  7/19 2.0 2.5mg  7/20 2.6       2mg  (According to [facility] records patient did not receive a dose of warfarin 7/20.) 7/21     2.7       2 mg 7/22 2.8 1 mg- ordered  Goal of Therapy:  INR 2.5-3.5  Monitor platelets by anticoagulation protocol: Yes   Plan:  INR level is therapeutic CBC reflected a drop in Hgb to 7.5 from 8.5 yesterday Plan to decrease Warfarin to 1 mg x1 Daily INR ordered. Continue to monitor CBC.  Pharmacy will continue to follow and order warfarin based on INRs  Benita Gutter, PharmD Pharmacy Resident 01/20/2019 11:57 AM

## 2019-01-20 NOTE — Progress Notes (Signed)
Per son he gave the patient TUMS, let family know that we have that ordered and will give her dose here if she will need it.

## 2019-01-21 ENCOUNTER — Ambulatory Visit: Payer: Medicare Other | Admitting: Family

## 2019-01-21 LAB — CBC
HCT: 25.6 % — ABNORMAL LOW (ref 36.0–46.0)
Hemoglobin: 7.5 g/dL — ABNORMAL LOW (ref 12.0–15.0)
MCH: 27.7 pg (ref 26.0–34.0)
MCHC: 29.3 g/dL — ABNORMAL LOW (ref 30.0–36.0)
MCV: 94.5 fL (ref 80.0–100.0)
Platelets: 203 10*3/uL (ref 150–400)
RBC: 2.71 MIL/uL — ABNORMAL LOW (ref 3.87–5.11)
RDW: 20.8 % — ABNORMAL HIGH (ref 11.5–15.5)
WBC: 8.4 10*3/uL (ref 4.0–10.5)
nRBC: 0 % (ref 0.0–0.2)

## 2019-01-21 LAB — PREPARE RBC (CROSSMATCH)

## 2019-01-21 LAB — PROTIME-INR
INR: 3 — ABNORMAL HIGH (ref 0.8–1.2)
Prothrombin Time: 30.8 seconds — ABNORMAL HIGH (ref 11.4–15.2)

## 2019-01-21 MED ORDER — OXYCODONE HCL 5 MG PO TABS
2.5000 mg | ORAL_TABLET | Freq: Four times a day (QID) | ORAL | Status: DC | PRN
  Administered 2019-01-22 – 2019-02-04 (×14): 2.5 mg via ORAL
  Filled 2019-01-21 (×14): qty 1

## 2019-01-21 MED ORDER — SODIUM CHLORIDE 0.9% IV SOLUTION
Freq: Once | INTRAVENOUS | Status: AC
  Administered 2019-01-21: 12:00:00 via INTRAVENOUS

## 2019-01-21 MED ORDER — FUROSEMIDE 10 MG/ML IJ SOLN
20.0000 mg | Freq: Once | INTRAMUSCULAR | Status: AC
  Administered 2019-01-21: 20 mg via INTRAVENOUS
  Filled 2019-01-21: qty 2

## 2019-01-21 MED ORDER — ACETAMINOPHEN 325 MG PO TABS
650.0000 mg | ORAL_TABLET | Freq: Once | ORAL | Status: DC
  Filled 2019-01-21 (×2): qty 2

## 2019-01-21 NOTE — Plan of Care (Signed)
  Problem: Education: Goal: Knowledge of General Education information will improve Description: Including pain rating scale, medication(s)/side effects and non-pharmacologic comfort measures Outcome: Progressing   Problem: Coping: Goal: Level of anxiety will decrease Outcome: Progressing   Problem: Pain Managment: Goal: General experience of comfort will improve Outcome: Progressing   

## 2019-01-21 NOTE — Consult Note (Addendum)
Addendum: Agree with the note below. Will continue warfarin 1 mg x 1 tonight. Hgb stable. Continue to monitor.   Eleonore Chiquito, PharmD, BCPS   ANTICOAGULATION CONSULT NOTE  Pharmacy Consult for Warfarin Dosing  Indication: Mitral Valve Replacement, A. Fib    Allergies  Allergen Reactions  . Ace Inhibitors     Cough  . Doxycycline Nausea Only  . Hydrocodone Itching and Nausea Only  . Mercury     Other reaction(s): Unknown  . Silver Dermatitis    Severe itching  . Cephalexin Rash  . Clindamycin/Lincomycin Rash  . Nickel Rash  . Penicillins Rash    Has patient had a PCN reaction causing immediate rash, facial/tongue/throat swelling, SOB or lightheadedness with hypotension: YES Has patient had a PCN reaction causing severe rash involving mucus membranes or skin necrosis: NO Has patient had a PCN reaction that required hospitalization NO Has patient had a PCN reaction occurring within the last 10 years: NO If all of the above answers are "NO", then may proceed with Cephalosporin use.    Patient Measurements: Height: 4\' 11"  (149.9 cm) Weight: 119 lb (54 kg) IBW/kg (Calculated) : 43.2   Vital Signs: Temp: 98.1 F (36.7 C) (07/23 0739) Temp Source: Oral (07/23 0739) BP: 111/43 (07/23 0739) Pulse Rate: 60 (07/23 0739)  Labs: Recent Labs    01/19/19 0026 01/19/19 1222 01/20/19 0508 01/21/19 0425  HGB 8.5*  --  7.5* 7.5*  HCT 28.8*  --  25.2* 25.6*  PLT 207  --  181 203  LABPROT 27.6* 28.4* 29.3* 30.8*  INR 2.6* 2.7* 2.8* 3.0*  CREATININE 1.63*  --  1.54*  --     Estimated Creatinine Clearance: 21.5 mL/min (A) (by C-G formula based on SCr of 1.54 mg/dL (H)).   Medical History: Past Medical History:  Diagnosis Date  . Arthritis    "back, hands, knees" (04/10/2016)  . Chronic back pain   . 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 EF 25% by echo in 08/2015 b. RHC in 08/2015 showed normal filling pressures; c.  10/2018 Echo (in setting of atrial tachycardia): EF 25-30%, impaired relaxation. Glob HK. RVSP 19mmHg. Mod dil RA/LA. Mech MV w/ nl fxn (mean grad 13mmHg). Mod TR.   Marland Kitchen Chronic kidney disease   . Compression fracture    "several; all in my back" (04/10/2016)  . GERD (gastroesophageal reflux disease)   . Hypertension   . Lymphedema   . Pacemaker 05/2017  . PAF (paroxysmal atrial fibrillation) ([place])    a. On amio & Coumadin - CHA2DS2VASc = 4.  . S/P MVR (mitral valve replacement)    a. MVR 1994 b. redo MVR in 08/2014 - on Coumadin  . Shortness of breath dyspnea    Assessment: Pharmacy consulted for warfarin dosing and monitoring for 84 yo female with PMH of mitral valve replacement and A. Fib. Patient transferred to [facility] 7/20 PM from [place] INR DOSE 7/17  2mg  7/18 1.6 3mg  7/19 2.0 2.5mg  7/20 2.6       2mg  (According to [facility] records patient did not receive a dose of warfarin 7/20.) 7/21     2.7       2 mg 7/22 2.8 1 mg- ordered 7/23 3.0 1 mg  Goal of Therapy:  INR 2.5-3.5  Monitor platelets by anticoagulation protocol: Yes   Plan:  INR level is therapeutic Hgb of 7.5 reflects no change from yesterday Plan to give Warfarin  1 mg x1 tonight.  Daily INR ordered. Continue to monitor CBC.  Spoke to MD on 7/22 regarding low Hgb- okay with continuing warfarin at lower dose.  Pharmacy will continue to follow and order warfarin based on INRs  Benita Gutter, PharmD Pharmacy Resident 01/21/2019 9:03 AM

## 2019-01-21 NOTE — Progress Notes (Signed)
Physical Therapy Treatment Patient Details Name: [name] MRN: [id number] DOB: 24-Feb-1935 Today's Date: 01/21/2019    History of Present Illness  84 y.o. female with a known history of right hip repair at [facility] on 01/11/2019 secondary to mechanical fall at home, paroxysmal atrial fibrillation, mitral valve replacement on Coumadin, COPD, CKD stage III, hypertension, history of left lower extremity hematoma with evacuation and porcine graft performed on 12/25/2018.    PT Comments    Pt sleepy on arrival, but wakes and was willing to do some light supine exercises only.  Pt needed convincing for this and was absolutely not interested in trying to do any mobility secondary to pain and feeling poorly in general.  Overall limited session, pt continues to be hypersensitive with LE palpation but tolerated exercises with less resistance as previous session with this PT, still very sensitive with knee ROM into more functional ranges.    Follow Up Recommendations  SNF     Equipment Recommendations       Recommendations for Other Services       Precautions / Restrictions Precautions Precautions: Fall Restrictions RLE Weight Bearing: Weight bearing as tolerated    Mobility  Bed Mobility               General bed mobility comments: deferred, pt not interested  Transfers                    Ambulation/Gait                 Stairs             Wheelchair Mobility    Modified Rankin (Stroke Patients Only)       Balance                                            Cognition Arousal/Alertness: Lethargic Behavior During Therapy: WFL for tasks assessed/performed Overall Cognitive Status: Within Functional Limits for tasks assessed                                 General Comments: Pt with multiple hematomas and sore areas throughout BLEs; use caution to assist       Exercises General Exercises - Lower  Extremity Ankle Circles/Pumps: Strengthening;15 reps(resisted DF ) Quad Sets: Strengthening;10 reps Short Arc Quad: AAROM;AROM;10 reps(AAROM on R, AROM on L) Heel Slides: AAROM;10 reps(pain limited with ROM >60 in b/l knees) Hip ABduction/ADduction: AAROM;Both;10 reps Straight Leg Raises: Left;AAROM;AROM;10 reps    General Comments        Pertinent Vitals/Pain Pain Assessment: 0-10 Pain Score: 6  Pain Location: low back pain, b/l LEs, R hip    Home Living                      Prior Function            PT Goals (current goals can now be found in the care plan section) Progress towards PT goals: Progressing toward goals    Frequency    Min 2X/week      PT Plan Current plan remains appropriate    Co-evaluation              AM-PAC PT "6 Clicks" Mobility   Outcome Measure  Help needed turning from your  back to your side while in a flat bed without using bedrails?: Total Help needed moving from lying on your back to sitting on the side of a flat bed without using bedrails?: Total Help needed moving to and from a bed to a chair (including a wheelchair)?: Total Help needed standing up from a chair using your arms (e.g., wheelchair or bedside chair)?: Total Help needed to walk in hospital room?: Total Help needed climbing 3-5 steps with a railing? : Total 6 Click Score: 6    End of Session   Activity Tolerance: Patient limited by fatigue;Patient limited by pain Patient left: with call bell/phone within reach;with bed alarm set   PT Visit Diagnosis: Muscle weakness (generalized) (M62.81);Difficulty in walking, not elsewhere classified (R26.2);Pain Pain - Right/Left: Right Pain - part of body: Hip     Time: 1017-5102 PT Time Calculation (min) (ACUTE ONLY): 17 min  Charges:  $Therapeutic Exercise: 8-22 mins                     Kreg Shropshire, DPT 01/21/2019, 5:41 PM

## 2019-01-21 NOTE — TOC Progression Note (Signed)
Transition of Care [facility]) - Progression Note    Patient Details  Name: [name] MRN: [id number] Date of Birth: 02/19/1935  Transition of Care [facility]) CM/SW Contact  Katrina Stack, RN Phone Number: 01/21/2019, 6:13 PM  Clinical Narrative:    required transfusion to to acute blood loss from her lower extremities hematoma. Discussed bed offers- that all facilities responded with offer except [facility] which is the preference. Reason was bed availability.  Sent Seth Bake  message asking if she could update [place] on bed availability    Expected Discharge Plan: (home with home health vs SNF)    Expected Discharge Plan and Services Expected Discharge Plan: (home with home health vs SNF)   Discharge Planning Services: CM Consult   Living arrangements for the past 2 months: [facility] settiings)                 DME Arranged: (Heads up to [facility] with [facility] for hospital bed. Have entered medical necissity for the bed) DME Agency: [employer]     Representative spoke with at DME Agency: [place] Arranged: RN, PT, OT, Nurse's Aide [place] Agency: [place] ([place]) Date [place]: 01/19/19       Social Determinants of Health (SDOH) Interventions    Readmission Risk Interventions Readmission Risk Prevention Plan 12/26/2018 12/07/2018 11/30/2018  Transportation Screening Complete Complete Complete  PCP or Specialist Appt within 3-5 Days - Complete -  HRI or Home Care Consult Complete Complete -  Social Work Consult for [place] Planning/Counseling Complete Not Complete -  SW consult not completed comments - NA -  Palliative Care Screening - Not Applicable -  Medication Review Press photographer) Complete Complete Complete  PCP or Specialist appointment within 3-5 days of discharge - - Complete  HRI or [place] - - Complete  SW Recovery Care/Counseling Consult - - Not Complete  SW Consult Not Complete Comments - - na  Palliative Care Screening - - Not  [place] - - Complete  Some recent data might be hidden

## 2019-01-21 NOTE — Progress Notes (Signed)
Patient's daughter [name] at bedside. Assisted patient with dinner tray.

## 2019-01-21 NOTE — Care Management Important Message (Signed)
Important Message  Patient Details  Name: [name] MRN: [id number] Date of Birth: 02/26/1935   Medicare Important Message Given:  Yes     [name] 01/21/2019, 1:15 PM

## 2019-01-21 NOTE — Progress Notes (Signed)
Patient ID: [name], female   DOB: Sep 08, 1934, 84 y.o.   MRN: [id number]   [employer] PROGRESS NOTE  [name] [id number] DOB: Aug 07, 1934 DOA: 01/11/2019 PCP: Rusty Aus, MD  HPI/Subjective: Patient not feeling well.  States she cannot get settled.  Soreness on the buttock.  Objective: Vitals:   01/21/19 1213 01/21/19 1240  BP: (!) 108/45 (!) 110/58  Pulse: 66 80  Resp: 18 18  Temp: 98.2 F (36.8 C) 97.9 F (36.6 C)  SpO2: 100% 98%    Intake/Output Summary (Last 24 hours) at 01/21/2019 1426 Last data filed at 01/21/2019 0928 Gross per 24 hour  Intake 243 ml  Output 600 ml  Net -357 ml   Filed Weights   12/30/2018 2008 01/20/19 0500 01/21/19 0400  Weight: 53.3 kg 54.1 kg 54 kg    ROS: Review of Systems  Constitutional: Negative for chills and fever.  Eyes: Negative for blurred vision.  Respiratory: Negative for cough and shortness of breath.   Cardiovascular: Negative for chest pain.  Gastrointestinal: Positive for constipation. Negative for abdominal pain, diarrhea, nausea and vomiting.  Genitourinary: Negative for dysuria.  Musculoskeletal: Positive for joint pain.  Neurological: Negative for dizziness and headaches.   Exam: Physical Exam  HENT:  Nose: No mucosal edema.  Mouth/Throat: No oropharyngeal exudate or posterior oropharyngeal edema.  Eyes: Pupils are equal, round, and reactive to light. Conjunctivae, EOM and lids are normal.  Neck: No JVD present. Carotid bruit is not present. No edema present. No thyroid mass and no thyromegaly present.  Cardiovascular: S1 normal and S2 normal. Exam reveals no gallop.  Murmur heard.  Systolic murmur is present with a grade of 3/6. Pulses:      Dorsalis pedis pulses are 2+ on the right side and 2+ on the left side.  Respiratory: No respiratory distress. She has decreased breath sounds in the right lower field and the left lower field. She has no wheezes. She has no rhonchi. She has no  rales.  GI: Soft. Bowel sounds are normal. There is no abdominal tenderness.  Musculoskeletal:     Right ankle: She exhibits swelling.     Left ankle: She exhibits swelling.  Lymphadenopathy:    She has no cervical adenopathy.  Neurological: She is alert. No cranial nerve deficit.  Skin: Skin is warm. Nails show no clubbing.  Stage II borderline stage III sacral decubitus with starting of some skin breakdown larger than a silver dollar.  Some dark skin overlying the area. Large skin tears right leg.  When I took off the dressing started oozing again. Large right hip hematoma.  Psychiatric: She has a normal mood and affect.      Data Reviewed: Basic Metabolic Panel: Recent Labs  Lab 01/19/19 0026 01/20/19 0508  NA 136 134*  K 3.7 3.5  CL 83* 83*  CO2 43* 42*  GLUCOSE 99 92  BUN 72* 70*  CREATININE 1.63* 1.54*  CALCIUM 8.8* 8.6*   CBC: Recent Labs  Lab 01/19/19 0026 01/20/19 0508 01/21/19 0425  WBC 9.1 8.6 8.4  HGB 8.5* 7.5* 7.5*  HCT 28.8* 25.2* 25.6*  MCV 95.4 94.0 94.5  PLT 207 181 203   BNP (last 3 results) Recent Labs    11/04/18 1141 12/04/18 1627 12/04/18 1954  BNP 505.0* 1,189.0* 992.0*     Recent Results (from the past 240 hour(s))  Novel Coronavirus,NAA,(SEND-OUT TO REF LAB - TAT 24-48 hrs); Hosp Order     Status: None  Collection Time: 01/19/19 12:10 AM   Specimen: Nasopharyngeal Swab; Respiratory  Result Value Ref Range Status   SARS-CoV-2, NAA NOT DETECTED NOT DETECTED Final    Comment: (NOTE) This test was developed and its performance characteristics determined by [employer]. This test has not been FDA cleared or approved. This test has been authorized by FDA under an Emergency Use Authorization (EUA). This test is only authorized for the duration of time the declaration that circumstances exist justifying the authorization of the emergency use of in vitro diagnostic tests for detection of SARS-CoV-2 virus and/or diagnosis of  COVID-19 infection under section 564(b)(1) of the Act, 21 U.S.C. 756EPP-2(R)(5), unless the authorization is terminated or revoked sooner. When diagnostic testing is negative, the possibility of a false negative result should be considered in the context of a patient's recent exposures and the presence of clinical signs and symptoms consistent with COVID-19. An individual without symptoms of COVID-19 and who is not shedding SARS-CoV-2 virus would expect to have a negative (not detected) result in this assay. Performed  At: [facility] [place], [place] [id number] Rush Farmer MD TK:[id number]    [place]  Final    Comment: Performed at [facility], [address] [place], [place] [address]     Studies: No results found.  Scheduled Meds: . acetaminophen  650 mg Oral Once  . amiodarone  200 mg Oral Daily  . docusate sodium  100 mg Oral BID  . feeding supplement (ENSURE ENLIVE)  237 mL Oral BID BM  . furosemide  20 mg Oral BID  . mouth rinse  15 mL Mouth Rinse BID  . multivitamin-lutein  1 capsule Oral Daily  . sodium chloride flush  3 mL Intravenous Q12H  . vitamin C  500 mg Oral Daily  . Warfarin - Pharmacist Dosing Inpatient   Does not apply q1800   Continuous Infusions:  Assessment/Plan:  1. Acute on chronic combined systolic and diastolic heart failure.  Continue oral Lasix 2. Paroxysmal atrial fibrillation and history of mitral valve replacement on amiodarone and Coumadin 3. Acute blood loss anemia secondary to large right hip hematoma and skin tear right leg.  Also has other hematomas on the legs.  Will transfuse 1 unit of packed red blood cells today on a hemoglobin of 7.5 with her symptomatic anemia. 4. Hip fracture status post operative repair.  Physical therapy recommending rehab. 5. Weakness.  Physical therapy evaluation 6. Chronic kidney disease stage III.  Watch with diuresis 7. Stage II to borderline  stage III sacral decubiti.  Will local wound care.  Must change positions 8. Large right hip hematoma.  Needs to be on Coumadin secondary to metallic mitral valve.  Continue to monitor. 9. Skin tear right lower extremity.  Oozing when I took off the bandage.  Needs a nonstick dressing there.  Code Status:     Code Status Orders  (From admission, onward)         Start     Ordered   01/19/19 0004  Do not attempt resuscitation (DNR)  Continuous    Question Answer Comment  In the event of cardiac or respiratory ARREST Do not call a "code blue"   In the event of cardiac or respiratory ARREST Do not perform Intubation, CPR, defibrillation or ACLS   In the event of cardiac or respiratory ARREST Use medication by any route, position, wound care, and other measures to relive pain and suffering. May use oxygen, suction and  manual treatment of airway obstruction as needed for comfort.      01/19/19 0003        Code Status History    Date Active Date Inactive Code Status Order ID Comments User Context   01/07/2019 2311 01/19/2019 0003 Full Code [id number]  Mayer Camel, NP Inpatient   01/22/2019 2309 01/07/2019 2311 Full Code [id number]  Mayer Camel, NP Inpatient   12/30/2018 1220 01/06/2019 0608 DNR [id number]  Ottie Glazier, MD Inpatient   12/26/2018 0147 12/30/2018 1220 Full Code [id number]  Lance Coon, MD Inpatient   12/04/2018 1937 12/15/2018 1643 Full Code [id number]  Mayer Camel, NP Inpatient   11/24/2018 1621 11/30/2018 2027 Full Code [id number]  Fritzi Mandes, MD Inpatient   11/24/2018 1557 11/24/2018 1620 DNR [id number]  Fritzi Mandes, MD Inpatient   11/09/2018 0333 11/24/2018 1557 Full Code [id number]  Mayer Camel, NP ED   11/04/2018 1650 11/08/2018 2006 Full Code [id number]  Saundra Shelling, MD Inpatient   04/10/2016 2034 04/19/2016 1757 Full Code [id number]  Vianne Bulls, MD Inpatient   09/29/2015 1053 10/10/2015 1406 Full Code [id number]  [name], MD Inpatient   04/28/2015 1626  04/30/2015 1422 Full Code [id number]  Demetrios Loll, MD Inpatient   Advance Care Planning Activity    Advance Directive Documentation     Most Recent Value  Type of Advance Directive  Out of facility DNR (pink MOST or yellow form)  Pre-existing out of facility DNR order (yellow form or pink MOST form)  Yellow form placed in chart (order not valid for inpatient use)  "MOST" Form in Place?  -     Family Communication: Daughter at the bedside Disposition Plan: To be determined based on clinical course  Time spent: 27 minutes  [place]

## 2019-01-22 LAB — CBC
HCT: 29.3 % — ABNORMAL LOW (ref 36.0–46.0)
Hemoglobin: 8.8 g/dL — ABNORMAL LOW (ref 12.0–15.0)
MCH: 28.2 pg (ref 26.0–34.0)
MCHC: 30 g/dL (ref 30.0–36.0)
MCV: 93.9 fL (ref 80.0–100.0)
Platelets: 189 10*3/uL (ref 150–400)
RBC: 3.12 MIL/uL — ABNORMAL LOW (ref 3.87–5.11)
RDW: 20.2 % — ABNORMAL HIGH (ref 11.5–15.5)
WBC: 7 10*3/uL (ref 4.0–10.5)
nRBC: 0 % (ref 0.0–0.2)

## 2019-01-22 LAB — TYPE AND SCREEN
ABO/RH(D): A POS
Antibody Screen: NEGATIVE
Unit division: 0

## 2019-01-22 LAB — BPAM RBC
Blood Product Expiration Date: 202008112359
ISSUE DATE / TIME: 202007231219
Unit Type and Rh: 6200

## 2019-01-22 LAB — PROTIME-INR
INR: 2.7 — ABNORMAL HIGH (ref 0.8–1.2)
Prothrombin Time: 28.5 seconds — ABNORMAL HIGH (ref 11.4–15.2)

## 2019-01-22 LAB — BASIC METABOLIC PANEL
Anion gap: 7 (ref 5–15)
BUN: 48 mg/dL — ABNORMAL HIGH (ref 8–23)
CO2: 45 mmol/L — ABNORMAL HIGH (ref 22–32)
Calcium: 8.9 mg/dL (ref 8.9–10.3)
Chloride: 87 mmol/L — ABNORMAL LOW (ref 98–111)
Creatinine, Ser: 1.31 mg/dL — ABNORMAL HIGH (ref 0.44–1.00)
GFR calc Af Amer: 44 mL/min — ABNORMAL LOW (ref >60)
GFR calc non Af Amer: 38 mL/min — ABNORMAL LOW (ref >60)
Glucose, Bld: 95 mg/dL (ref 70–99)
Potassium: 3.2 mmol/L — ABNORMAL LOW (ref 3.5–5.1)
Sodium: 139 mmol/L (ref 135–145)

## 2019-01-22 MED ORDER — WARFARIN SODIUM 2 MG PO TABS
2.0000 mg | ORAL_TABLET | Freq: Once | ORAL | Status: AC
  Administered 2019-01-22: 2 mg via ORAL
  Filled 2019-01-22: qty 1

## 2019-01-22 MED ORDER — ZINC OXIDE 40 % EX OINT
TOPICAL_OINTMENT | Freq: Three times a day (TID) | CUTANEOUS | Status: DC | PRN
  Administered 2019-01-22: 10:00:00 via TOPICAL
  Filled 2019-01-22: qty 113

## 2019-01-22 MED ORDER — POTASSIUM CHLORIDE CRYS ER 20 MEQ PO TBCR
40.0000 meq | EXTENDED_RELEASE_TABLET | Freq: Once | ORAL | Status: AC
  Administered 2019-01-22: 40 meq via ORAL
  Filled 2019-01-22: qty 2

## 2019-01-22 NOTE — Progress Notes (Signed)
Patient ID: [name], female   DOB: 03-18-1935, 84 y.o.   MRN: [id number]   [facility] PROGRESS NOTE  KAYLYN GARROW TML:[id number] DOB: 1935-03-02 DOA: 01/14/2019 PCP: Rusty Aus, MD  HPI/Subjective: Patient not feeling well.  Patient cannot elaborate to me on what is making her not feel well.  She states that she cannot get comfortable and the buttock hurts which could be the decubiti.  Objective: Vitals:   01/22/19 0729 01/22/19 1704  BP: (!) 113/52 (!) 117/48  Pulse: 61 (!) 59  Resp:    Temp: 97.7 F (36.5 C) 98.4 F (36.9 C)  SpO2: 100% 100%    Intake/Output Summary (Last 24 hours) at 01/22/2019 1705 Last data filed at 01/22/2019 1547 Gross per 24 hour  Intake 120 ml  Output 1550 ml  Net -1430 ml   Filed Weights   01/20/19 0500 01/21/19 0400 01/22/19 0500  Weight: 54.1 kg 54 kg 53.8 kg    ROS: Review of Systems  Constitutional: Negative for chills and fever.  Eyes: Negative for blurred vision.  Respiratory: Negative for cough and shortness of breath.   Cardiovascular: Negative for chest pain.  Gastrointestinal: Negative for abdominal pain, diarrhea, nausea and vomiting.  Genitourinary: Negative for dysuria.  Musculoskeletal: Positive for joint pain.  Neurological: Negative for dizziness and headaches.   Exam: Physical Exam  HENT:  Nose: No mucosal edema.  Mouth/Throat: No oropharyngeal exudate or posterior oropharyngeal edema.  Eyes: Pupils are equal, round, and reactive to light. Conjunctivae, EOM and lids are normal.  Neck: No JVD present. Carotid bruit is not present. No edema present. No thyroid mass and no thyromegaly present.  Cardiovascular: S1 normal and S2 normal. Exam reveals no gallop.  Murmur heard.  Systolic murmur is present with a grade of 3/6. Pulses:      Dorsalis pedis pulses are 2+ on the right side and 2+ on the left side.  Respiratory: No respiratory distress. She has decreased breath sounds in the right lower  field and the left lower field. She has no wheezes. She has no rhonchi. She has no rales.  GI: Soft. Bowel sounds are normal. There is no abdominal tenderness.  Musculoskeletal:     Right ankle: She exhibits swelling.     Left ankle: She exhibits swelling.  Lymphadenopathy:    She has no cervical adenopathy.  Neurological: She is alert. No cranial nerve deficit.  Skin: Skin is warm. Nails show no clubbing.  Stage II borderline stage III sacral decubitus with starting of some skin breakdown larger than a silver dollar.  Some dark skin overlying the area. Large skin tears right leg.  When I took off the dressing started oozing again. Large right hip hematoma.  Psychiatric: She has a normal mood and affect.      Data Reviewed: Basic Metabolic Panel: Recent Labs  Lab 01/19/19 0026 01/20/19 0508 01/22/19 0604  NA 136 134* 139  K 3.7 3.5 3.2*  CL 83* 83* 87*  CO2 43* 42* 45*  GLUCOSE 99 92 95  BUN 72* 70* 48*  CREATININE 1.63* 1.54* 1.31*  CALCIUM 8.8* 8.6* 8.9   CBC: Recent Labs  Lab 01/19/19 0026 01/20/19 0508 01/21/19 0425 01/22/19 0604  WBC 9.1 8.6 8.4 7.0  HGB 8.5* 7.5* 7.5* 8.8*  HCT 28.8* 25.2* 25.6* 29.3*  MCV 95.4 94.0 94.5 93.9  PLT 207 181 203 189   BNP (last 3 results) Recent Labs    11/04/18 1141 12/04/18 1627 12/04/18 1954  BNP 505.0* 1,189.0* 992.0*     Recent Results (from the past 240 hour(s))  Novel Coronavirus,NAA,(SEND-OUT TO REF LAB - TAT 24-48 hrs); Hosp Order     Status: None   Collection Time: 01/19/19 12:10 AM   Specimen: Nasopharyngeal Swab; Respiratory  Result Value Ref Range Status   SARS-CoV-2, NAA NOT DETECTED NOT DETECTED Final    Comment: (NOTE) This test was developed and its performance characteristics determined by [employer]. This test has not been FDA cleared or approved. This test has been authorized by FDA under an Emergency Use Authorization (EUA). This test is only authorized for the duration of time the  declaration that circumstances exist justifying the authorization of the emergency use of in vitro diagnostic tests for detection of SARS-CoV-2 virus and/or diagnosis of COVID-19 infection under section 564(b)(1) of the Act, 21 U.S.C. 419FXT-0(W)(4), unless the authorization is terminated or revoked sooner. When diagnostic testing is negative, the possibility of a false negative result should be considered in the context of a patient's recent exposures and the presence of clinical signs and symptoms consistent with COVID-19. An individual without symptoms of COVID-19 and who is not shedding SARS-CoV-2 virus would expect to have a negative (not detected) result in this assay. Performed  At: [facility] [place], [place] [id number] Rush Farmer MD ME:[id number]    Kendrick  Final    Comment: Performed at [facility], [address] [place], [place] [address]     Studies: No results found.  Scheduled Meds: . acetaminophen  650 mg Oral Once  . amiodarone  200 mg Oral Daily  . docusate sodium  100 mg Oral BID  . feeding supplement (ENSURE ENLIVE)  237 mL Oral BID BM  . furosemide  20 mg Oral BID  . mouth rinse  15 mL Mouth Rinse BID  . multivitamin-lutein  1 capsule Oral Daily  . sodium chloride flush  3 mL Intravenous Q12H  . vitamin C  500 mg Oral Daily  . warfarin  2 mg Oral ONCE-1800  . Warfarin - Pharmacist Dosing Inpatient   Does not apply q1800   Continuous Infusions:  Assessment/Plan:  1. Acute on chronic combined systolic and diastolic heart failure.  Continue oral Lasix 2. Paroxysmal atrial fibrillation and history of mitral valve replacement on amiodarone and Coumadin 3. Acute blood loss anemia secondary to large right hip hematoma and skin tear right leg.  Also has other hematomas on the legs.  Status post transfusion yesterday with hemoglobin coming up from 7.5-8.8. 4. Hip fracture status post operative  repair.  Physical therapy recommending rehab.  Family only interested in [place].  Awaiting to hear back if [facility] will accept or not. 5. Weakness.  Physical therapy evaluation recommending rehab 6. Chronic kidney disease stage III.  Watch with diuresis 7. Stage II to borderline stage III sacral decubiti.  Will local wound care.  Must change positions 8. Large right hip hematoma.  Needs to be on Coumadin secondary to metallic mitral valve.  Continue to monitor.  Family requesting orthopedic surgery to see her while she is here.  Placed a consult for [facility] orthopedics who saw the patient last time. 9. Skin tear right lower extremity.  Oozing when I took off the bandage.  Needs a nonstick dressing there.  Code Status:     Code Status Orders  (From admission, onward)         Start     Ordered  01/19/19 0004  Do not attempt resuscitation (DNR)  Continuous    Question Answer Comment  In the event of cardiac or respiratory ARREST Do not call a "code blue"   In the event of cardiac or respiratory ARREST Do not perform Intubation, CPR, defibrillation or ACLS   In the event of cardiac or respiratory ARREST Use medication by any route, position, wound care, and other measures to relive pain and suffering. May use oxygen, suction and manual treatment of airway obstruction as needed for comfort.      01/19/19 0003        Code Status History    Date Active Date Inactive Code Status Order ID Comments User Context   01/26/2019 2311 01/19/2019 0003 Full Code [id number]  Mayer Camel, NP Inpatient   01/25/2019 2309 01/12/2019 2311 Full Code [id number]  Mayer Camel, NP Inpatient   12/30/2018 1220 01/06/2019 0608 DNR [id number]  Ottie Glazier, MD Inpatient   12/26/2018 0147 12/30/2018 1220 Full Code [id number]  Lance Coon, MD Inpatient   12/04/2018 1937 12/15/2018 1643 Full Code [id number]  Mayer Camel, NP Inpatient   11/24/2018 1621 11/30/2018 2027 Full Code [id number]  Fritzi Mandes, MD Inpatient    11/24/2018 1557 11/24/2018 1620 DNR [id number]  Fritzi Mandes, MD Inpatient   11/09/2018 0333 11/24/2018 1557 Full Code [id number]  Mayer Camel, NP ED   11/04/2018 1650 11/08/2018 2006 Full Code [id number]  Saundra Shelling, MD Inpatient   04/10/2016 2034 04/19/2016 1757 Full Code [id number]  Vianne Bulls, MD Inpatient   09/29/2015 1053 10/10/2015 1406 Full Code [id number]  [name], MD Inpatient   04/28/2015 1626 04/30/2015 1422 Full Code [id number]  Demetrios Loll, MD Inpatient   Advance Care Planning Activity    Advance Directive Documentation     Most Recent Value  Type of Advance Directive  Out of facility DNR (pink MOST or yellow form)  Pre-existing out of facility DNR order (yellow form or pink MOST form)  Yellow form placed in chart (order not valid for inpatient use)  "MOST" Form in Place?  -     Family Communication: Daughter on the phone Disposition Plan: To be determined based on clinical course  Time spent: 28 minutes  [place]

## 2019-01-22 NOTE — Progress Notes (Signed)
Occupational Therapy Treatment Patient Details Name: [name] MRN: [id number] DOB: [birth year] Today's Date: 01/22/2019    History of present illness  84 y.o. female with a known history of right hip repair at [facility] on 01/11/2019 secondary to mechanical fall at home, paroxysmal atrial fibrillation, mitral valve replacement on Coumadin, COPD, CKD stage III, hypertension, history of left lower extremity hematoma with evacuation and porcine graft performed on 12/25/2018.   OT comments  Pt seen for OT co-tx w/ PT. With encouragement, pt willing to participate despite fatigue and pain. Pt performed bed mobility with Max x1 and a +2 more for safety/assist for BLE mgt. Pt tolerated standing briefly with heavy posterior lean with assist to remain upright. Pt reports her caregiver (present for session) had assisted her in self feeding and grooming tasks earlier this am. Pt educated in benefits of small bouts of activity and participating more in daily ADL tasks including self feeding and grooming to support improved overall activity tolerance and minimize further functional decline, pt verbalized understanding. Pt continues to benefit from skilled OT services. Continue to recommend STR.   Follow Up Recommendations  SNF    Equipment Recommendations       Recommendations for Other Services      Precautions / Restrictions Precautions Precautions: Fall Restrictions Weight Bearing Restrictions: Yes RLE Weight Bearing: Weight bearing as tolerated       Mobility Bed Mobility Overal bed mobility: Needs Assistance Bed Mobility: Sit to Supine;Supine to Sit     Supine to sit: Max assist;HOB elevated;+2 for safety/equipment Sit to supine: Max assist;+2 for safety/equipment   General bed mobility comments: +2 for BLE mgt  Transfers Overall transfer level: Needs assistance Equipment used: Rolling walker (2 wheeled) Transfers: Sit to/from Stand Sit to Stand: +2 physical  assistance;Mod assist         General transfer comment: cues for hand/foot placement and sequencing to improve performance    Balance Overall balance assessment: Needs assistance Sitting-balance support: Bilateral upper extremity supported Sitting balance-Leahy Scale: Fair Sitting balance - Comments: slight L lateral lean onto L elbow/forearm pt reports 2/2 fatigue Postural control: Posterior lean;Left lateral lean   Standing balance-Leahy Scale: Zero Standing balance comment: posterior lean and unable to assist in leaning forward to bring COM over BOS                           ADL either performed or assessed with clinical judgement   ADL Overall ADL's : Needs assistance/impaired                                       General ADL Comments: continues to require max to total assist for LB ADL, max assist for UB ADL     Vision Baseline Vision/History: Wears glasses Wears Glasses: At all times Patient Visual Report: No change from baseline     Perception     Praxis      Cognition Arousal/Alertness: Awake/alert Behavior During Therapy: WFL for tasks assessed/performed Overall Cognitive Status: Within Functional Limits for tasks assessed                                          Exercises Other Exercises Other Exercises: pt educated in benefits of small bouts of  activity and participating more in daily ADL tasks including self feeding and grooming to support improved overall activity tolerance and minimize further functional decline, pt verbalized understanding   Shoulder Instructions       General Comments      Pertinent Vitals/ Pain       Pain Assessment: Faces Faces Pain Scale: Hurts even more Pain Location: low back pain,  R hip/sacrum Pain Descriptors / Indicators: Aching;Guarding;Grimacing Pain Intervention(s): Limited activity within patient's tolerance;Monitored during session;Repositioned  Home Living                                           Prior Functioning/Environment              Frequency  Min 2X/week        Progress Toward Goals  OT Goals(current goals can now be found in the care plan section)  Progress towards OT goals: OT to reassess next treatment  Acute Rehab OT Goals Patient Stated Goal: start doing more for myself again OT Goal Formulation: With patient Time For Goal Achievement: 02/02/19 Potential to Achieve Goals: Good  Plan Discharge plan remains appropriate;Frequency remains appropriate    Co-evaluation    PT/OT/SLP Co-Evaluation/Treatment: Yes Reason for Co-Treatment: For patient/therapist safety;To address functional/ADL transfers PT goals addressed during session: Mobility/safety with mobility;Balance;Proper use of DME OT goals addressed during session: Proper use of Adaptive equipment and DME;ADL's and self-care      AM-PAC OT "6 Clicks" Daily Activity     Outcome Measure   Help from another person eating meals?: Total Help from another person taking care of personal grooming?: A Lot Help from another person toileting, which includes using toliet, bedpan, or urinal?: Total Help from another person bathing (including washing, rinsing, drying)?: Total Help from another person to put on and taking off regular upper body clothing?: A Little Help from another person to put on and taking off regular lower body clothing?: Total 6 Click Score: 9    End of Session Equipment Utilized During Treatment: Gait belt;Rolling walker  OT Visit Diagnosis: Muscle weakness (generalized) (M62.81);History of falling (Z91.81);Pain Pain - Right/Left: Right Pain - part of body: Hip   Activity Tolerance Patient tolerated treatment well   Patient Left in bed;with call bell/phone within reach;with bed alarm set   Nurse Communication          Time: 2122-4825 OT Time Calculation (min): 23 min  Charges: OT General Charges $OT Visit: 1 Visit OT  Treatments $Therapeutic Activity: 8-22 mins  Jeni Salles, MPH, MS, OTR/L ascom [phone] 01/22/19, 1:21 PM

## 2019-01-22 NOTE — Consult Note (Addendum)
[facility] for Warfarin Dosing  Indication: Mitral Valve Replacement, A. Fib    Allergies  Allergen Reactions  . Ace Inhibitors     Cough  . Doxycycline Nausea Only  . Hydrocodone Itching and Nausea Only  . Mercury     Other reaction(s): Unknown  . Silver Dermatitis    Severe itching  . Cephalexin Rash  . Clindamycin/Lincomycin Rash  . Nickel Rash  . Penicillins Rash    Has patient had a PCN reaction causing immediate rash, facial/tongue/throat swelling, SOB or lightheadedness with hypotension: YES Has patient had a PCN reaction causing severe rash involving mucus membranes or skin necrosis: NO Has patient had a PCN reaction that required hospitalization NO Has patient had a PCN reaction occurring within the last 10 years: NO If all of the above answers are "NO", then may proceed with Cephalosporin use.    Patient Measurements: Height: 4\' 11"  (149.9 cm) Weight: 118 lb 8 oz (53.8 kg) IBW/kg (Calculated) : 43.2   Vital Signs: Temp: 97.7 F (36.5 C) (07/24 0729) Temp Source: Oral (07/24 0729) BP: 113/52 (07/24 0729) Pulse Rate: 61 (07/24 0729)  Labs: Recent Labs    01/20/19 0508 01/21/19 0425 01/22/19 0604  HGB 7.5* 7.5* 8.8*  HCT 25.2* 25.6* 29.3*  PLT 181 203 189  LABPROT 29.3* 30.8* 28.5*  INR 2.8* 3.0* 2.7*  CREATININE 1.54*  --  1.31*    Estimated Creatinine Clearance: 25.2 mL/min (A) (by C-G formula based on SCr of 1.31 mg/dL (H)).   Medical History: Past Medical History:  Diagnosis Date  . Arthritis    "back, hands, knees" (04/10/2016)  . Chronic back pain   . 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 EF 25% by echo in 08/2015 b. RHC in 08/2015 showed normal filling pressures; c. 10/2018 Echo (in setting of atrial tachycardia): EF 25-30%, impaired relaxation. Glob HK. RVSP 60mmHg. Mod dil RA/LA. Mech MV w/ nl fxn (mean grad 55mmHg). Mod TR.   Marland Kitchen Chronic kidney disease    . Compression fracture    "several; all in my back" (04/10/2016)  . GERD (gastroesophageal reflux disease)   . Hypertension   . Lymphedema   . Pacemaker 05/2017  . PAF (paroxysmal atrial fibrillation) ([place])    a. On amio & Coumadin - CHA2DS2VASc = 4.  . S/P MVR (mitral valve replacement)    a. MVR 1994 b. redo MVR in 08/2014 - on Coumadin  . Shortness of breath dyspnea    Assessment: Pharmacy consulted for warfarin dosing and monitoring for 84 yo female with PMH of mitral valve replacement and A. Fib. Patient transferred to [facility] 7/20 PM from [place]. \  Home dose: warfarin 2.5 mg daily.   DDI: on amio (increase INR)   DATE INR DOSE 7/17  2mg  7/18 1.6 3mg  7/19 2.0 2.5mg  7/20 2.6       2mg  ( According to [facility] records patient did not receive a dose of warfarin 7/20.) 7/21     2.7       2 mg 7/22 2.8 1 mg 7/23 3.0 1 mg - did not receive.  7/24     2.7       2 mg   Goal of Therapy:  INR 2.5-3.5 (ideally want INR between 2.5 to 3) Monitor platelets by anticoagulation protocol: Yes   Plan:  INR level is therapeutic Hgb of 8.8. Transfused 1 unit of blood on 7/23.  Plan  to give Warfarin 2 mg x 1 tonight.  Daily INR ordered. Continue to monitor CBC.  Spoke to MD on 7/22 regarding low Hgb- okay with continuing warfarin at lower dose.  Pharmacy will continue to follow and order warfarin based on INRs  Oswald Hillock, PharmD, BCPS 01/22/2019 7:56 AM

## 2019-01-22 NOTE — Progress Notes (Signed)
Physical Therapy Treatment Patient Details Name: [name] MRN: [id number] DOB: 10/28/1934 Today's Date: 01/22/2019    History of Present Illness  84 y.o. female with a known history of right hip repair at [facility] on 01/11/2019 secondary to mechanical fall at home, paroxysmal atrial fibrillation, mitral valve replacement on Coumadin, COPD, CKD stage III, hypertension, history of left lower extremity hematoma with evacuation and porcine graft performed on 12/25/2018.    PT Comments    Pt in bed, agrees hesitantly to session.  To edge of bed with max a x 2.  Once sitting she is generally able to remain upright with min guard but does lean down on her left elbow when fatigued.  She needs assist to get her feet under neath her despite bed being raised.  Stood with walker and mod a x 2 but once up only requires min a x 2 at times.  She stands for about 1 minute before needing max a x 2/dependant assist.     Follow Up Recommendations  SNF     Equipment Recommendations       Recommendations for Other Services       Precautions / Restrictions Precautions Precautions: Fall Precaution Comments: fragile skin on length Restrictions Weight Bearing Restrictions: Yes RLE Weight Bearing: Weight bearing as tolerated    Mobility  Bed Mobility Overal bed mobility: Needs Assistance Bed Mobility: Sit to Supine;Supine to Sit     Supine to sit: Max assist;HOB elevated;+2 for safety/equipment Sit to supine: Max assist;+2 for safety/equipment   General bed mobility comments: +2 for BLE mgt  Transfers Overall transfer level: Needs assistance Equipment used: Rolling walker (2 wheeled) Transfers: Sit to/from Stand Sit to Stand: +2 safety/equipment;Mod assist         General transfer comment: stood at bedside for 1 minute with min a x 2 once up  Ambulation/Gait             General Gait Details: unable   Stairs             Wheelchair Mobility    Modified  [place] (Stroke Patients Only)       Balance Overall balance assessment: Needs assistance Sitting-balance support: Bilateral upper extremity supported Sitting balance-Leahy Scale: Fair Sitting balance - Comments: slight L lateral lean onto L elbow/forearm pt reports 2/2 fatigue Postural control: Posterior lean;Left lateral lean Standing balance support: Bilateral upper extremity supported Standing balance-Leahy Scale: Poor Standing balance comment: min a x 2 to remain standing with walker                            Cognition Arousal/Alertness: Awake/alert Behavior During Therapy: WFL for tasks assessed/performed Overall Cognitive Status: Within Functional Limits for tasks assessed                                        Exercises Other Exercises Other Exercises: pt educated in benefits of small bouts of activity and participating more in daily ADL tasks including self feeding and grooming to support improved overall activity tolerance and minimize further functional decline, pt verbalized understanding    General Comments        Pertinent Vitals/Pain Pain Assessment: Faces Faces Pain Scale: Hurts even more Pain Location: low back pain,  R hip/sacrum Pain Descriptors / Indicators: Aching;Guarding;Grimacing Pain Intervention(s): Limited activity within patient's tolerance;Monitored during session  Home Living                      Prior Function            PT Goals (current goals can now be found in the care plan section) Acute Rehab PT Goals Patient Stated Goal: start doing more for myself again Progress towards PT goals: Progressing toward goals    Frequency    Min 2X/week      PT Plan Current plan remains appropriate    Co-evaluation PT/OT/SLP Co-Evaluation/Treatment: Yes Reason for Co-Treatment: Complexity of the patient's impairments (multi-system involvement);To address functional/ADL transfers PT goals addressed  during session: Mobility/safety with mobility;Balance OT goals addressed during session: ADL's and self-care;Proper use of Adaptive equipment and DME      AM-PAC PT "6 Clicks" Mobility   Outcome Measure  Help needed turning from your back to your side while in a flat bed without using bedrails?: Total Help needed moving from lying on your back to sitting on the side of a flat bed without using bedrails?: Total Help needed moving to and from a bed to a chair (including a wheelchair)?: Total Help needed standing up from a chair using your arms (e.g., wheelchair or bedside chair)?: A Lot Help needed to walk in hospital room?: Total Help needed climbing 3-5 steps with a railing? : Total 6 Click Score: 7    End of Session Equipment Utilized During Treatment: Gait belt Activity Tolerance: Patient tolerated treatment well;Patient limited by fatigue Patient left: in bed;with call bell/phone within reach;with bed alarm set   Pain - Right/Left: Right Pain - part of body: Hip     Time: 2876-8115 PT Time Calculation (min) (ACUTE ONLY): 19 min  Charges:  $Therapeutic Activity: 8-22 mins                    Chesley Noon, PTA 01/22/19, 1:46 PM

## 2019-01-22 NOTE — Progress Notes (Signed)
Patient defers dressing changes for bilateral lower extremities.

## 2019-01-22 NOTE — TOC Progression Note (Signed)
Transition of Care [facility]) - Progression Note    Patient Details  Name: [name] MRN: [id number] Date of Birth: 1934-10-17  Transition of Care [facility]) CM/SW Contact  Latanya Maudlin, RN Phone Number: 01/22/2019, 1:43 PM  Clinical Narrative:  Family preference is still to go to [facility] if able and if not prefers to dc [facility] with [facility]. Voicemail left for [employer] at [facility].    Expected Discharge Plan: (home with home health vs SNF)    Expected Discharge Plan and Services Expected Discharge Plan: (home with home health vs SNF)   Discharge Planning Services: CM Consult   Living arrangements for the past 2 months: [facility] settiings)                 DME Arranged: (Heads up to [facility] with [facility] for hospital bed. Have entered medical necissity for the bed) DME Agency: [facility]     Representative spoke with at DME Agency: [place] Arranged: RN, PT, OT, Nurse's Aide [place] Agency: [place] ([place]) Date [place]: 01/19/19       Social Determinants of Health (SDOH) Interventions    Readmission Risk Interventions Readmission Risk Prevention Plan 12/26/2018 12/07/2018 11/30/2018  Transportation Screening Complete Complete Complete  PCP or Specialist Appt within 3-5 Days - Complete -  HRI or Home Care Consult Complete Complete -  Social Work Consult for [place] Planning/Counseling Complete Not Complete -  SW consult not completed comments - NA -  Palliative Care Screening - Not Applicable -  Medication Review Press photographer) Complete Complete Complete  PCP or Specialist appointment within 3-5 days of discharge - - Complete  HRI or [place] - - Complete  SW Recovery Care/Counseling Consult - - Not Complete  SW Consult Not Complete Comments - - na  Palliative Care Screening - - Not [place] - - Complete  Some recent data might be hidden

## 2019-01-23 ENCOUNTER — Other Ambulatory Visit: Payer: Self-pay

## 2019-01-23 LAB — BASIC METABOLIC PANEL
Anion gap: 8 (ref 5–15)
BUN: 38 mg/dL — ABNORMAL HIGH (ref 8–23)
CO2: 42 mmol/L — ABNORMAL HIGH (ref 22–32)
Calcium: 9.2 mg/dL (ref 8.9–10.3)
Chloride: 88 mmol/L — ABNORMAL LOW (ref 98–111)
Creatinine, Ser: 1.25 mg/dL — ABNORMAL HIGH (ref 0.44–1.00)
GFR calc Af Amer: 47 mL/min — ABNORMAL LOW (ref >60)
GFR calc non Af Amer: 40 mL/min — ABNORMAL LOW (ref >60)
Glucose, Bld: 88 mg/dL (ref 70–99)
Potassium: 3.8 mmol/L (ref 3.5–5.1)
Sodium: 138 mmol/L (ref 135–145)

## 2019-01-23 LAB — PROTIME-INR
INR: 2.4 — ABNORMAL HIGH (ref 0.8–1.2)
Prothrombin Time: 25.5 seconds — ABNORMAL HIGH (ref 11.4–15.2)

## 2019-01-23 LAB — HEMOGLOBIN: Hemoglobin: 9.6 g/dL — ABNORMAL LOW (ref 12.0–15.0)

## 2019-01-23 MED ORDER — WARFARIN SODIUM 2 MG PO TABS
2.0000 mg | ORAL_TABLET | Freq: Once | ORAL | Status: AC
  Administered 2019-01-23: 2 mg via ORAL
  Filled 2019-01-23: qty 1

## 2019-01-23 NOTE — Consult Note (Addendum)
[place] for Warfarin Dosing  Indication: Mitral Valve Replacement, A. Fib    Allergies  Allergen Reactions  . Ace Inhibitors     Cough  . Doxycycline Nausea Only  . Hydrocodone Itching and Nausea Only  . Mercury     Other reaction(s): Unknown  . Silver Dermatitis    Severe itching  . Cephalexin Rash  . Clindamycin/Lincomycin Rash  . Nickel Rash  . Penicillins Rash    Has patient had a PCN reaction causing immediate rash, facial/tongue/throat swelling, SOB or lightheadedness with hypotension: YES Has patient had a PCN reaction causing severe rash involving mucus membranes or skin necrosis: NO Has patient had a PCN reaction that required hospitalization NO Has patient had a PCN reaction occurring within the last 10 years: NO If all of the above answers are "NO", then may proceed with Cephalosporin use.    Patient Measurements: Height: 4\' 11"  (149.9 cm) Weight: 117 lb 3.2 oz (53.2 kg) IBW/kg (Calculated) : 43.2   Vital Signs: Temp: 98.4 F (36.9 C) (07/25 0455) Temp Source: Oral (07/25 0455) BP: 116/56 (07/25 0455) Pulse Rate: 80 (07/25 0455)  Labs: Recent Labs    01/21/19 0425 01/22/19 0604 01/23/19 0516  HGB 7.5* 8.8* 9.6*  HCT 25.6* 29.3*  --   PLT 203 189  --   LABPROT 30.8* 28.5* 25.5*  INR 3.0* 2.7* 2.4*  CREATININE  --  1.31* 1.25*    Estimated Creatinine Clearance: 26.3 mL/min (A) (by C-G formula based on SCr of 1.25 mg/dL (H)).   Medical History: Past Medical History:  Diagnosis Date  . Arthritis    "back, hands, knees" (04/10/2016)  . Chronic back pain   . 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 EF 25% by echo in 08/2015 b. RHC in 08/2015 showed normal filling pressures; c. 10/2018 Echo (in setting of atrial tachycardia): EF 25-30%, impaired relaxation. Glob HK. RVSP 66mmHg. Mod dil RA/LA. Mech MV w/ nl fxn (mean grad 44mmHg). Mod TR.   Marland Kitchen Chronic kidney  disease   . Compression fracture    "several; all in my back" (04/10/2016)  . GERD (gastroesophageal reflux disease)   . Hypertension   . Lymphedema   . Pacemaker 05/2017  . PAF (paroxysmal atrial fibrillation) ([place])    a. On amio & Coumadin - CHA2DS2VASc = 4.  . S/P MVR (mitral valve replacement)    a. MVR 1994 b. redo MVR in 08/2014 - on Coumadin  . Shortness of breath dyspnea    Assessment: Pharmacy consulted for warfarin dosing and monitoring for 84 yo female with PMH of mitral valve replacement and A. Fib. Patient transferred to [facility] 7/20 PM from [place]. \  Home dose: warfarin 2.5 mg daily.   DDI: on amio (increase INR)   DATE INR DOSE 7/17  2mg  7/18 1.6 3mg  7/19 2.0 2.5mg  7/20 2.6       2mg  ( According to [facility] records patient did not receive a dose of warfarin 7/20.) 7/21     2.7       2 mg 7/22 2.8 1 mg 7/23 3.0 1 mg - did not receive.  7/24     2.7       2 mg  7/25 2.4  Goal of Therapy:  INR 2.5-3.5 (ideally want INR between 2.5 to 3) Monitor platelets by anticoagulation protocol: Yes   Plan:  INR level is slightly subtherapeutic Plan to give Warfarin 2mg   x 1 tonight.  Daily INR ordered. Continue to monitor CBC.   Spoke to MD on 7/22 regarding low Hgb- okay with continuing warfarin at lower dose.  Pharmacy will continue to follow and order warfarin based on INRs  Pernell Dupre, PharmD, BCPS 01/23/2019 6:50 AM

## 2019-01-23 NOTE — Progress Notes (Signed)
Patient ID: [name], female   DOB: 03-26-1935, 84 y.o.   MRN: [id number]   [employer] PROGRESS NOTE  [name] QZR:[id number] DOB: [birth year] DOA: 01/11/2019 PCP: Rusty Aus, MD  HPI/Subjective: Patient currently resting        Objective: Vitals:   01/23/19 0455 01/23/19 0744  BP: (!) 116/56 (!) 104/50  Pulse: 80 61  Resp: 18   Temp: 98.4 F (36.9 C) 98 F (36.7 C)  SpO2: 100% 100%    Intake/Output Summary (Last 24 hours) at 01/23/2019 1421 Last data filed at 01/23/2019 0500 Gross per 24 hour  Intake -  Output 1450 ml  Net -1450 ml   Filed Weights   01/21/19 0400 01/22/19 0500 01/23/19 0138  Weight: 54 kg 53.8 kg 53.2 kg    ROS: Review of Systems  Constitutional: Negative for chills and fever.  Eyes: Negative for blurred vision.  Respiratory: Negative for cough and shortness of breath.   Cardiovascular: Negative for chest pain.  Gastrointestinal: Negative for abdominal pain, diarrhea, nausea and vomiting.  Genitourinary: Negative for dysuria.  Musculoskeletal: Positive for joint pain.  Neurological: Negative for dizziness and headaches.   Exam: Physical Exam  HENT:  Nose: No mucosal edema.  Mouth/Throat: No oropharyngeal exudate or posterior oropharyngeal edema.  Eyes: Pupils are equal, round, and reactive to light. Conjunctivae, EOM and lids are normal.  Neck: No JVD present. Carotid bruit is not present. No edema present. No thyroid mass and no thyromegaly present.  Cardiovascular: S1 normal and S2 normal. Exam reveals no gallop.  Murmur heard.  Systolic murmur is present with a grade of 3/6. Pulses:      Dorsalis pedis pulses are 2+ on the right side and 2+ on the left side.  Respiratory: No respiratory distress. She has decreased breath sounds in the right lower field and the left lower field. She has no wheezes. She has no rhonchi. She has no rales.  GI: Soft. Bowel sounds are normal. There is no abdominal tenderness.   Musculoskeletal:     Right ankle: She exhibits swelling.     Left ankle: She exhibits swelling.  Lymphadenopathy:    She has no cervical adenopathy.  Neurological: She is alert. No cranial nerve deficit.  Skin: Skin is warm. Nails show no clubbing.  Stage II borderline stage III sacral decubitus with starting of some skin breakdown larger than a silver dollar.  Some dark skin overlying the area. Large skin tears right leg.  When I took off the dressing started oozing again. Large right hip hematoma.  Psychiatric: She has a normal mood and affect.      Data Reviewed: Basic Metabolic Panel: Recent Labs  Lab 01/19/19 0026 01/20/19 0508 01/22/19 0604 01/23/19 0516  NA 136 134* 139 138  K 3.7 3.5 3.2* 3.8  CL 83* 83* 87* 88*  CO2 43* 42* 45* 42*  GLUCOSE 99 92 95 88  BUN 72* 70* 48* 38*  CREATININE 1.63* 1.54* 1.31* 1.25*  CALCIUM 8.8* 8.6* 8.9 9.2   CBC: Recent Labs  Lab 01/19/19 0026 01/20/19 0508 01/21/19 0425 01/22/19 0604 01/23/19 0516  WBC 9.1 8.6 8.4 7.0  --   HGB 8.5* 7.5* 7.5* 8.8* 9.6*  HCT 28.8* 25.2* 25.6* 29.3*  --   MCV 95.4 94.0 94.5 93.9  --   PLT 207 181 203 189  --    BNP (last 3 results) Recent Labs    11/04/18 1141 12/04/18 1627 12/04/18 1954  BNP 505.0* 1,189.0*  992.0*     Recent Results (from the past 240 hour(s))  Novel Coronavirus,NAA,(SEND-OUT TO REF LAB - TAT 24-48 hrs); Hosp Order     Status: None   Collection Time: 01/19/19 12:10 AM   Specimen: Nasopharyngeal Swab; Respiratory  Result Value Ref Range Status   SARS-CoV-2, NAA NOT DETECTED NOT DETECTED Final    Comment: (NOTE) This test was developed and its performance characteristics determined by [employer]. This test has not been FDA cleared or approved. This test has been authorized by FDA under an Emergency Use Authorization (EUA). This test is only authorized for the duration of time the declaration that circumstances exist justifying the authorization of the  emergency use of in vitro diagnostic tests for detection of SARS-CoV-2 virus and/or diagnosis of COVID-19 infection under section 564(b)(1) of the Act, 21 U.S.C. 188CZY-6(A)(6), unless the authorization is terminated or revoked sooner. When diagnostic testing is negative, the possibility of a false negative result should be considered in the context of a patient's recent exposures and the presence of clinical signs and symptoms consistent with COVID-19. An individual without symptoms of COVID-19 and who is not shedding SARS-CoV-2 virus would expect to have a negative (not detected) result in this assay. Performed  At: [facility] [place], [place] [id number] Rush Farmer MD AT:[id number]    [place]  Final    Comment: Performed at [facility], [address] [place], [place] [address]     Studies: No results found.  Scheduled Meds: . acetaminophen  650 mg Oral Once  . amiodarone  200 mg Oral Daily  . docusate sodium  100 mg Oral BID  . feeding supplement (ENSURE ENLIVE)  237 mL Oral BID BM  . furosemide  20 mg Oral BID  . mouth rinse  15 mL Mouth Rinse BID  . multivitamin-lutein  1 capsule Oral Daily  . sodium chloride flush  3 mL Intravenous Q12H  . vitamin C  500 mg Oral Daily  . warfarin  2 mg Oral ONCE-1800  . Warfarin - Pharmacist Dosing Inpatient   Does not apply q1800   Continuous Infusions:  Assessment/Plan:  1. Acute on chronic combined systolic and diastolic heart failure.  Continue oral Lasix 2. Paroxysmal atrial fibrillation and history of mitral valve replacement on amiodarone and Coumadin 3. Acute blood loss anemia secondary to large right hip hematoma and skin tear right leg.  Also has other hematomas on the legs.  Hemoglobin stable 4. Hip fracture status post operative repair.  Physical therapy recommending rehab.  Family only interested in [place] I talked to case manager they state that they  have not heard back it may be difficult to place patient just in [place].   5. Weakness.  Physical therapy evaluation recommending rehab 6. Chronic kidney disease stage III.  Watch with diuresis 7. Stage II to borderline stage III sacral decubiti.  Will local wound care.  Must change positions 8. Large right hip hematoma.  Needs to be on Coumadin secondary to metallic mitral valve.  Continue to monitor.  Family requesting orthopedic surgery to see her while she is here.  Placed a consult for [facility] orthopedics who saw the patient last time. 9. Skin tear right lower extremity.  Oozing when I took off the bandage.  Needs a nonstick dressing there.  Code Status:     Code Status Orders  (From admission, onward)         Start  Ordered   01/19/19 0004  Do not attempt resuscitation (DNR)  Continuous    Question Answer Comment  In the event of cardiac or respiratory ARREST Do not call a "code blue"   In the event of cardiac or respiratory ARREST Do not perform Intubation, CPR, defibrillation or ACLS   In the event of cardiac or respiratory ARREST Use medication by any route, position, wound care, and other measures to relive pain and suffering. May use oxygen, suction and manual treatment of airway obstruction as needed for comfort.      01/19/19 0003        Code Status History    Date Active Date Inactive Code Status Order ID Comments User Context   01/23/2019 2311 01/19/2019 0003 Full Code [id number]  Mayer Camel, NP Inpatient   01/21/2019 2309 01/17/2019 2311 Full Code [id number]  Mayer Camel, NP Inpatient   12/30/2018 1220 01/06/2019 0608 DNR [id number]  Ottie Glazier, MD Inpatient   12/26/2018 0147 12/30/2018 1220 Full Code [id number]  Lance Coon, MD Inpatient   12/04/2018 1937 12/15/2018 1643 Full Code [id number]  Mayer Camel, NP Inpatient   11/24/2018 1621 11/30/2018 2027 Full Code [id number]  Fritzi Mandes, MD Inpatient   11/24/2018 1557 11/24/2018 1620 DNR [id number]  Fritzi Mandes, MD  Inpatient   11/09/2018 0333 11/24/2018 1557 Full Code [id number]  Mayer Camel, NP ED   11/04/2018 1650 11/08/2018 2006 Full Code [id number]  Saundra Shelling, MD Inpatient   04/10/2016 2034 04/19/2016 1757 Full Code [id number]  Vianne Bulls, MD Inpatient   09/29/2015 1053 10/10/2015 1406 Full Code [id number]  [name], MD Inpatient   04/28/2015 1626 04/30/2015 1422 Full Code [id number]  Demetrios Loll, MD Inpatient   Advance Care Planning Activity    Advance Directive Documentation     Most Recent Value  Type of Advance Directive  Out of facility DNR (pink MOST or yellow form)  Pre-existing out of facility DNR order (yellow form or pink MOST form)  Yellow form placed in chart (order not valid for inpatient use)  "MOST" Form in Place?  -     Family Communication: Daughter on the phone Disposition Plan: To be determined based on clinical course  Time spent: 28 minutes  [name] [employer]

## 2019-01-23 NOTE — Progress Notes (Addendum)
Physical Therapy Treatment Patient Details Name: [name] MRN: [id number] DOB: [name] 08, 1935 Today's Date: 01/23/2019    History of Present Illness  84 y.o. female with a known history of right hip repair at [facility] on 01/11/2019 secondary to mechanical fall at home, paroxysmal atrial fibrillation, mitral valve replacement on Coumadin, COPD, CKD stage III, hypertension, history of left lower extremity hematoma with evacuation and porcine graft performed on 12/25/2018.    PT Comments    Pt in bed with personal sitter in attendance. Per chart review pt will return home vs [facility] for rehab.  Discussed with sitter as daughter not in at the time.  Pt has a hospital bed, wheelchair, commode and walker at home.  The only other equipment that may be beneficial to them at this time is a lift system but it is questionable if it would be tolerated by pt due to fragile skin.  A Hoyer lift may be quite uncomfortable for her legs.  A sit to stand lift might be a better option as pressure is more around ribs an back without abrasions to legs.  She was able to stand quite well on Friday and given time I think it is reasonable to expect pt to be able to transfer with +1-+2 assist to chair/ commode with further therapy intervention.  Would hold off on a lift for home at this time to see how she progresses unless desired by family and chose a sit to stand vs hoyer to allow for easier pericare with bathroom activities and more active participation by pt given her ability to sit EOB and assist with standing. HHPT would be appropriate also if they chose home vs SNF.  Participated in exercises as described below.  To edge of bed with mod a x 1 and verbal cues.  Pt sat edge of bed for 5 minutes before fatigue.  Standing deferred today per her request.  Returned to supine with max a x 1 and repositioned for comfort..   Follow Up Recommendations  SNF;Other (comment) - see above.     Equipment  Recommendations       Recommendations for Other Services       Precautions / Restrictions Precautions Precautions: Fall Precaution Comments: fragile skin on length Restrictions Weight Bearing Restrictions: Yes RLE Weight Bearing: Weight bearing as tolerated    Mobility  Bed Mobility Overal bed mobility: Needs Assistance Bed Mobility: Sit to Supine;Supine to Sit     Supine to sit: Max assist;HOB elevated Sit to supine: Max assist;HOB elevated      Transfers Overall transfer level: Needs assistance Equipment used: Rolling walker (2 wheeled)                Ambulation/Gait             General Gait Details: unable   Stairs             Wheelchair Mobility    Modified Rankin (Stroke Patients Only)       Balance Overall balance assessment: Needs assistance Sitting-balance support: Bilateral upper extremity supported Sitting balance-Leahy Scale: Fair   Postural control: Posterior lean;Left lateral lean                                  Cognition Arousal/Alertness: Awake/alert Behavior During Therapy: WFL for tasks assessed/performed Overall Cognitive Status: Within Functional Limits for tasks assessed  Exercises Other Exercises Other Exercises: supine ankle pumps, heel slides, ab/add and SLR in supine, seated LAQ x 10 with care for hand placements due to skin    General Comments        Pertinent Vitals/Pain Pain Assessment: Faces Faces Pain Scale: Hurts little more Pain Location: low back pain,  R hip/sacrum Pain Descriptors / Indicators: Aching;Guarding;Grimacing Pain Intervention(s): Limited activity within patient's tolerance;Monitored during session;Premedicated before session    Home Living                      Prior Function            PT Goals (current goals can now be found in the care plan section) Progress towards PT goals: Progressing toward  goals    Frequency    Min 2X/week      PT Plan Current plan remains appropriate    Co-evaluation              AM-PAC PT "6 Clicks" Mobility   Outcome Measure  Help needed turning from your back to your side while in a flat bed without using bedrails?: Total Help needed moving from lying on your back to sitting on the side of a flat bed without using bedrails?: A Lot Help needed moving to and from a bed to a chair (including a wheelchair)?: Total Help needed standing up from a chair using your arms (e.g., wheelchair or bedside chair)?: A Lot Help needed to walk in hospital room?: Total Help needed climbing 3-5 steps with a railing? : Total 6 Click Score: 8    End of Session   Activity Tolerance: Patient tolerated treatment well;Patient limited by fatigue Patient left: in bed;with call bell/phone within reach;with bed alarm set Nurse Communication: Mobility status Pain - Right/Left: Right Pain - part of body: Hip     Time: 1255-1336 PT Time Calculation (min) (ACUTE ONLY): 41 min  Charges:  $Therapeutic Exercise: 8-22 mins $Therapeutic Activity: 23-37 mins                    Chesley Noon, PTA 01/23/19, 2:58 PM

## 2019-01-23 NOTE — Consult Note (Signed)
ORTHOPAEDIC CONSULT: Notified last night (by my partner on office call) of a late consult. The patient's family requested Orthopaedic follow-up by me now that she has transferred back to [facility] from [place]. She was apparently transferred earlier this week (January 18, 2019), but Orthopaedics was not contacted/consulted until 5:04 PM yesterday.  The patient is well known to me from her previous admission to [facility]. She had sustained a right Intertrochanteric femur fracture on 12/25/2018. Despite aggressive attempts at medical optimization, the Anesthesiologists at [place] recommended transfer to a tertiary care facility that could provide more appropriate perioperative anesthesia care.  I have reviewed the notes from [place] and will try to retrieve the pertinent radiographs. She underwent ORIF of the right Intertrochanteric femur fracture on 01/11/2019 by Dr. Margarita Rana at [facility]. A Smith Nephew Trigen long cephallomedullary nail system was utilized.  Physical therapy was been ordered and initiated.  I am not in [place] today, but will further evaluate the patient upon my return. A full note will follow.  Alannah Averhart P. Holley Bouche M.D.

## 2019-01-23 NOTE — TOC Progression Note (Signed)
Transition of Care [facility]) - Progression Note    Patient Details  Name: MONNA CREAN MRN: [id number] Date of Birth: 1935-04-18  Transition of Care [facility]) CM/SW Contact  Latanya Maudlin, RN Phone Number: 01/23/2019, 9:27 AM  Clinical Narrative:  Spoke with daughter [name] who is at the bedside. They would still wish for me to keep in contact with [facility] although Mrs Poulter is "absolutely going home". [facility] is following and would need home health orders at discharge.Patient also seeking DME hospital bed. Orders in place. Previous RNCM dictated note needed for bed. Per [employer] they were working on possible aflo vest prior to transfer. Will obtain PFT orders. There also may be a need for wheelchair. Patient would like to work with PT over the weekend and see what their DME equipment recommendations. TOC team will continue to follow.    Expected Discharge Plan: (home with home health vs SNF)    Expected Discharge Plan and Services Expected Discharge Plan: (home with home health vs SNF)   Discharge Planning Services: CM Consult   Living arrangements for the past 2 months: [facility] settiings)                 DME Arranged: (Heads up to [facility] with [facility] for hospital bed. Have entered medical necissity for the bed) DME Agency: [facility]     Representative spoke with at DME Agency: [place] Arranged: RN, PT, OT, Nurse's Aide [place] Agency: [place] ([place]) Date [place]: 01/19/19       Social Determinants of Health (SDOH) Interventions    Readmission Risk Interventions Readmission Risk Prevention Plan 12/26/2018 12/07/2018 11/30/2018  Transportation Screening Complete Complete Complete  PCP or Specialist Appt within 3-5 Days - Complete -  HRI or Home Care Consult Complete Complete -  Social Work Consult for [place] Planning/Counseling Complete Not Complete -  SW consult not completed comments - NA -  Palliative Care Screening - Not Applicable -   Medication Review Press photographer) Complete Complete Complete  PCP or Specialist appointment within 3-5 days of discharge - - Complete  HRI or [place] - - Complete  SW Recovery Care/Counseling Consult - - Not Complete  SW Consult Not Complete Comments - - na  Palliative Care Screening - - Not [place] - - Complete  Some recent data might be hidden

## 2019-01-24 ENCOUNTER — Encounter: Payer: Self-pay | Admitting: Orthopedic Surgery

## 2019-01-24 LAB — CBC
HCT: 29.1 % — ABNORMAL LOW (ref 36.0–46.0)
Hemoglobin: 8.6 g/dL — ABNORMAL LOW (ref 12.0–15.0)
MCH: 28.5 pg (ref 26.0–34.0)
MCHC: 29.6 g/dL — ABNORMAL LOW (ref 30.0–36.0)
MCV: 96.4 fL (ref 80.0–100.0)
Platelets: 178 10*3/uL (ref 150–400)
RBC: 3.02 MIL/uL — ABNORMAL LOW (ref 3.87–5.11)
RDW: 19.3 % — ABNORMAL HIGH (ref 11.5–15.5)
WBC: 6.7 10*3/uL (ref 4.0–10.5)
nRBC: 0 % (ref 0.0–0.2)

## 2019-01-24 LAB — BASIC METABOLIC PANEL
Anion gap: 7 (ref 5–15)
BUN: 36 mg/dL — ABNORMAL HIGH (ref 8–23)
CO2: 40 mmol/L — ABNORMAL HIGH (ref 22–32)
Calcium: 8.7 mg/dL — ABNORMAL LOW (ref 8.9–10.3)
Chloride: 89 mmol/L — ABNORMAL LOW (ref 98–111)
Creatinine, Ser: 1.16 mg/dL — ABNORMAL HIGH (ref 0.44–1.00)
GFR calc Af Amer: 51 mL/min — ABNORMAL LOW (ref >60)
GFR calc non Af Amer: 44 mL/min — ABNORMAL LOW (ref >60)
Glucose, Bld: 83 mg/dL (ref 70–99)
Potassium: 3.7 mmol/L (ref 3.5–5.1)
Sodium: 136 mmol/L (ref 135–145)

## 2019-01-24 LAB — PROTIME-INR
INR: 2.2 — ABNORMAL HIGH (ref 0.8–1.2)
Prothrombin Time: 23.7 seconds — ABNORMAL HIGH (ref 11.4–15.2)

## 2019-01-24 MED ORDER — WARFARIN SODIUM 2.5 MG PO TABS
2.5000 mg | ORAL_TABLET | Freq: Once | ORAL | Status: AC
  Administered 2019-01-24: 2.5 mg via ORAL
  Filled 2019-01-24: qty 1

## 2019-01-24 NOTE — Consult Note (Signed)
[place] Nurse wound consult note Reason for Consult:Bilateral LEs. I have communicated to Dr. Dustin Flock, MD this morning that the patient is under that continuing care of Plastic Surgery (Dr. Guadlupe Spanish) and that my partner Lenore Manner) say the patient with her daughter in attendance while Dr. Marla Roe connected telephonically.  The wound care orders on record are per Dr. Marla Roe.  Otho team will not follow, but will remain available to this patient, the nursing and medical teams.  Please re-consult if needed. Thanks, Maudie Flakes, MSN, RN, [place], Arther Abbott  Pager# [phone]

## 2019-01-24 NOTE — Progress Notes (Signed)
Patient ID: [name], female   DOB: 1934-07-20, 84 y.o.   MRN: [id number]   [facility] PROGRESS NOTE  REFUGIO MCCONICO MWN:[id number] DOB: 11/28/1934 DOA: 01/29/2019 PCP: Rusty Aus, MD  HPI/Subjective: Patient currently resting        Objective: Vitals:   01/24/19 0407 01/24/19 0724  BP: 115/64 (!) 112/55  Pulse: 81 60  Resp: 20 19  Temp: 98 F (36.7 C) 97.9 F (36.6 C)  SpO2: 100% 98%    Intake/Output Summary (Last 24 hours) at 01/24/2019 1317 Last data filed at 01/24/2019 1012 Gross per 24 hour  Intake 3 ml  Output 550 ml  Net -547 ml   Filed Weights   01/22/19 0500 01/23/19 0138 01/24/19 0407  Weight: 53.8 kg 53.2 kg 55.1 kg    ROS: Review of Systems  Constitutional: Negative for chills and fever.  Eyes: Negative for blurred vision.  Respiratory: Negative for cough and shortness of breath.   Cardiovascular: Negative for chest pain.  Gastrointestinal: Negative for abdominal pain, diarrhea, nausea and vomiting.  Genitourinary: Negative for dysuria.  Musculoskeletal: Positive for joint pain.  Neurological: Negative for dizziness and headaches.   Exam: Physical Exam  HENT:  Nose: No mucosal edema.  Mouth/Throat: No oropharyngeal exudate or posterior oropharyngeal edema.  Eyes: Pupils are equal, round, and reactive to light. Conjunctivae, EOM and lids are normal.  Neck: No JVD present. Carotid bruit is not present. No edema present. No thyroid mass and no thyromegaly present.  Cardiovascular: S1 normal and S2 normal. Exam reveals no gallop.  Murmur heard.  Systolic murmur is present with a grade of 3/6. Pulses:      Dorsalis pedis pulses are 2+ on the right side and 2+ on the left side.  Respiratory: No respiratory distress. She has decreased breath sounds in the right lower field and the left lower field. She has no wheezes. She has no rhonchi. She has no rales.  GI: Soft. Bowel sounds are normal. There is no abdominal tenderness.   Musculoskeletal:     Right ankle: She exhibits swelling.     Left ankle: She exhibits swelling.  Lymphadenopathy:    She has no cervical adenopathy.  Neurological: She is alert. No cranial nerve deficit.  Skin: Skin is warm. Nails show no clubbing.  Large right hip hematoma.  Psychiatric: She has a normal mood and affect.      Data Reviewed: Basic Metabolic Panel: Recent Labs  Lab 01/19/19 0026 01/20/19 0508 01/22/19 0604 01/23/19 0516 01/24/19 0440  NA 136 134* 139 138 136  K 3.7 3.5 3.2* 3.8 3.7  CL 83* 83* 87* 88* 89*  CO2 43* 42* 45* 42* 40*  GLUCOSE 99 92 95 88 83  BUN 72* 70* 48* 38* 36*  CREATININE 1.63* 1.54* 1.31* 1.25* 1.16*  CALCIUM 8.8* 8.6* 8.9 9.2 8.7*   CBC: Recent Labs  Lab 01/19/19 0026 01/20/19 0508 01/21/19 0425 01/22/19 0604 01/23/19 0516 01/24/19 0440  WBC 9.1 8.6 8.4 7.0  --  6.7  HGB 8.5* 7.5* 7.5* 8.8* 9.6* 8.6*  HCT 28.8* 25.2* 25.6* 29.3*  --  29.1*  MCV 95.4 94.0 94.5 93.9  --  96.4  PLT 207 181 203 189  --  178   BNP (last 3 results) Recent Labs    11/04/18 1141 12/04/18 1627 12/04/18 1954  BNP 505.0* 1,189.0* 992.0*     Recent Results (from the past 240 hour(s))  Novel Coronavirus,NAA,(SEND-OUT TO REF LAB - TAT 24-48 hrs); [place]  Order     Status: None   Collection Time: 01/19/19 12:10 AM   Specimen: Nasopharyngeal Swab; Respiratory  Result Value Ref Range Status   SARS-CoV-2, NAA NOT DETECTED NOT DETECTED Final    Comment: (NOTE) This test was developed and its performance characteristics determined by [employer]. This test has not been FDA cleared or approved. This test has been authorized by FDA under an Emergency Use Authorization (EUA). This test is only authorized for the duration of time the declaration that circumstances exist justifying the authorization of the emergency use of in vitro diagnostic tests for detection of SARS-CoV-2 virus and/or diagnosis of COVID-19 infection under section 564(b)(1)  of the Act, 21 U.S.C. 970YOV-7(C)(5), unless the authorization is terminated or revoked sooner. When diagnostic testing is negative, the possibility of a false negative result should be considered in the context of a patient's recent exposures and the presence of clinical signs and symptoms consistent with COVID-19. An individual without symptoms of COVID-19 and who is not shedding SARS-CoV-2 virus would expect to have a negative (not detected) result in this assay. Performed  At: [facility] [place], [place] [id number] Rush Farmer MD OI:[id number]    [place]  Final    Comment: Performed at [facility], [address] [place], [place] [address]     Studies: No results found.  Scheduled Meds: . acetaminophen  650 mg Oral Once  . amiodarone  200 mg Oral Daily  . docusate sodium  100 mg Oral BID  . feeding supplement (ENSURE ENLIVE)  237 mL Oral BID BM  . furosemide  20 mg Oral BID  . mouth rinse  15 mL Mouth Rinse BID  . multivitamin-lutein  1 capsule Oral Daily  . sodium chloride flush  3 mL Intravenous Q12H  . vitamin C  500 mg Oral Daily  . warfarin  2.5 mg Oral ONCE-1800  . Warfarin - Pharmacist Dosing Inpatient   Does not apply q1800   Continuous Infusions:  Assessment/Plan:  1. Acute on chronic combined systolic and diastolic heart failure.  Continue oral Lasix 2. Paroxysmal atrial fibrillation and history of mitral valve replacement on amiodarone and Coumadin 3. Acute blood loss anemia secondary to large right hip hematoma and skin tear right leg.  Also has other hematomas on the legs.  Hemoglobin stable 4. Hip fracture status post operative repair.  Physical therapy recommending rehab.  Family only interested in [place] I talked to case manager they state that they have not heard back it may be difficult to place patient just in [place].   5. Weakness.  Physical therapy evaluation recommending  rehab 6. Chronic kidney disease stage III.  Watch with diuresis 7. Stage II to borderline stage III sacral decubiti.  Will local wound care.  Must change positions 8. Large right hip hematoma.  Needs to be on Coumadin secondary to metallic mitral valve.  Continue to monitor.  Family requesting orthopedic surgery to see her while she is here.  Placed a consult for [facility] orthopedics who saw the patient last time. 9. Skin tear right lower extremity.  Oozing when I took off the bandage.  Needs a nonstick dressing there.  Code Status:     Code Status Orders  (From admission, onward)         Start     Ordered   01/19/19 0004  Do not attempt resuscitation (DNR)  Continuous    Question Answer Comment  In the event  of cardiac or respiratory ARREST Do not call a "code blue"   In the event of cardiac or respiratory ARREST Do not perform Intubation, CPR, defibrillation or ACLS   In the event of cardiac or respiratory ARREST Use medication by any route, position, wound care, and other measures to relive pain and suffering. May use oxygen, suction and manual treatment of airway obstruction as needed for comfort.      01/19/19 0003        Code Status History    Date Active Date Inactive Code Status Order ID Comments User Context   01/14/2019 2311 01/19/2019 0003 Full Code [id number]  Mayer Camel, NP Inpatient   01/28/2019 2309 01/25/2019 2311 Full Code [id number]  Mayer Camel, NP Inpatient   12/30/2018 1220 01/06/2019 0608 DNR [id number]  Ottie Glazier, MD Inpatient   12/26/2018 0147 12/30/2018 1220 Full Code [id number]  Lance Coon, MD Inpatient   12/04/2018 1937 12/15/2018 1643 Full Code [id number]  Mayer Camel, NP Inpatient   11/24/2018 1621 11/30/2018 2027 Full Code [id number]  Fritzi Mandes, MD Inpatient   11/24/2018 1557 11/24/2018 1620 DNR [id number]  Fritzi Mandes, MD Inpatient   11/09/2018 0333 11/24/2018 1557 Full Code [id number]  Mayer Camel, NP ED   11/04/2018 1650 11/08/2018 2006 Full Code [id number]   Saundra Shelling, MD Inpatient   04/10/2016 2034 04/19/2016 1757 Full Code [id number]  Vianne Bulls, MD Inpatient   09/29/2015 1053 10/10/2015 1406 Full Code [id number]  [name], MD Inpatient   04/28/2015 1626 04/30/2015 1422 Full Code [id number]  Demetrios Loll, MD Inpatient   Advance Care Planning Activity    Advance Directive Documentation     Most Recent Value  Type of Advance Directive  Out of facility DNR (pink MOST or yellow form)  Pre-existing out of facility DNR order (yellow form or pink MOST form)  Yellow form placed in chart (order not valid for inpatient use)  "MOST" Form in Place?  -     Family Communication: Daughter on the phone Disposition Plan: To be determined based on clinical course  Time spent: 28 minutes  Ayomide Zuleta [facility]

## 2019-01-24 NOTE — Progress Notes (Signed)
RT performed Bedside spirometry with patient with the following results:                  Predicted       Best Effort % Predicted  FVC----->      1.98             1.10       55.4%  FEV1---->      1.45                    0.81      55.9%  FEV1/FVC--> 73.5   74.0     100.7%  Patient was cooperative and followed directions to the best of her ability.

## 2019-01-24 NOTE — Progress Notes (Addendum)
Patient complained of generalized pain x1. No signs of respiratory distress. Foley remains in place. Patient is resting. Will continue to monitor and assess.  Xeroform was not available to complete dressing change, will request from supply chain in the morning.

## 2019-01-24 NOTE — Consult Note (Signed)
[place] for Warfarin Dosing  Indication: Mitral Valve Replacement, A. Fib    Allergies  Allergen Reactions  . Ace Inhibitors     Cough  . Doxycycline Nausea Only  . Hydrocodone Itching and Nausea Only  . Mercury     Other reaction(s): Unknown  . Silver Dermatitis    Severe itching  . Cephalexin Rash  . Clindamycin/Lincomycin Rash  . Nickel Rash  . Penicillins Rash    Has patient had a PCN reaction causing immediate rash, facial/tongue/throat swelling, SOB or lightheadedness with hypotension: YES Has patient had a PCN reaction causing severe rash involving mucus membranes or skin necrosis: NO Has patient had a PCN reaction that required hospitalization NO Has patient had a PCN reaction occurring within the last 10 years: NO If all of the above answers are "NO", then may proceed with Cephalosporin use.    Patient Measurements: Height: 4\' 11"  (149.9 cm) Weight: 121 lb 7.6 oz (55.1 kg) IBW/kg (Calculated) : 43.2   Vital Signs: Temp: 98 F (36.7 C) (07/26 0407) Temp Source: Oral (07/26 0407) BP: 115/64 (07/26 0407) Pulse Rate: 81 (07/26 0407)  Labs: Recent Labs    01/22/19 0604 01/23/19 0516 01/24/19 0440  HGB 8.8* 9.6* 8.6*  HCT 29.3*  --  29.1*  PLT 189  --  178  LABPROT 28.5* 25.5* 23.7*  INR 2.7* 2.4* 2.2*  CREATININE 1.31* 1.25* 1.16*    Estimated Creatinine Clearance: 28.8 mL/min (A) (by C-G formula based on SCr of 1.16 mg/dL (H)).   Medical History: Past Medical History:  Diagnosis Date  . Arthritis    "back, hands, knees" (04/10/2016)  . Chronic back pain   . 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 EF 25% by echo in 08/2015 b. RHC in 08/2015 showed normal filling pressures; c. 10/2018 Echo (in setting of atrial tachycardia): EF 25-30%, impaired relaxation. Glob HK. RVSP 106mmHg. Mod dil RA/LA. Mech MV w/ nl fxn (mean grad 75mmHg). Mod TR.   Marland Kitchen Chronic kidney disease    . Compression fracture    "several; all in my back" (04/10/2016)  . GERD (gastroesophageal reflux disease)   . Hypertension   . Lymphedema   . Pacemaker 05/2017  . PAF (paroxysmal atrial fibrillation) ([place])    a. On amio & Coumadin - CHA2DS2VASc = 4.  . S/P MVR (mitral valve replacement)    a. MVR 1994 b. redo MVR in 08/2014 - on Coumadin  . Shortness of breath dyspnea    Assessment: Pharmacy consulted for warfarin dosing and monitoring for 84 yo female with PMH of mitral valve replacement and A. Fib. Patient transferred to [facility] 7/20 PM from [place]. \  Home dose: warfarin 2.5 mg daily.   DDI: on amio (increase INR)   DATE INR DOSE 7/17  2mg  7/18 1.6 3mg  7/19 2.0 2.5mg  7/20 2.6       2mg  ( According to [facility] records patient did not receive a dose of warfarin 7/20.) 7/21     2.7       2 mg 7/22 2.8 1 mg 7/23 3.0 1 mg - did not receive.  7/24     2.7       2 mg  7/25 2.4 2mg  7/26 2.2   Goal of Therapy:  INR 2.5-3.5 (ideally want INR between 2.5 to 3) Monitor platelets by anticoagulation protocol: Yes   Plan:  INR level is subtherapeutic Plan to give Warfarin  2.5mg  x 1 tonight.  Daily INR ordered. Continue to monitor CBC.   Spoke to MD on 7/22 regarding low Hgb- okay with continuing warfarin at lower dose.  Pharmacy will continue to follow and order warfarin based on INRs  Pernell Dupre, PharmD, BCPS 01/24/2019 6:19 AM

## 2019-01-24 NOTE — Consult Note (Signed)
ORTHOPAEDIC CONSULTATION  PATIENT NAME: [name] DOB: June 19, 1935  MRN: [id number]  REQUESTING PHYSICIAN: Dustin Flock, MD  Chief Complaint: Status post right hip surgery  HPI: [name] is a 84 y.o. female who sustained a right intertrochanteric femur fracture on 12/25/2018.  She has multiple medical comorbidities which required extended care in the critical care unit.  Anesthesiologist at [facility] recommended transfer to a tertiary care center to allow for more appropriate perioperative anesthesia options.  She was subsequently transferred to [facility] and underwent open reduction and internal fixation of the right intertrochanteric femur fracture under spinal anesthesia on 01/11/2019 as per Dr. Margarita Rana.  A [employer] Nephew TriGen long cephalomedullary nail system was utilized.  She apparently tolerated the procedure well and was stable postoperatively.  At her family's request, she was transferred back to [facility] for continuation of her care and rehabilitation.  Past Medical History:  Diagnosis Date  . Arthritis    "back, hands, knees" (04/10/2016)  . Chronic back pain   . 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 EF 25% by echo in 08/2015 b. RHC in 08/2015 showed normal filling pressures; c. 10/2018 Echo (in setting of atrial tachycardia): EF 25-30%, impaired relaxation. Glob HK. RVSP 65mmHg. Mod dil RA/LA. Mech MV w/ nl fxn (mean grad 73mmHg). Mod TR.   Marland Kitchen Chronic kidney disease   . Compression fracture    "several; all in my back" (04/10/2016)  . GERD (gastroesophageal reflux disease)   . Hypertension   . Lymphedema   . Pacemaker 05/2017  . PAF (paroxysmal atrial fibrillation) ([place])    a. On amio & Coumadin - CHA2DS2VASc = 4.  . S/P MVR (mitral valve replacement)    a. MVR 1994 b. redo MVR in 08/2014 - on Coumadin  . Shortness of breath dyspnea    Past Surgical History:   Procedure Laterality Date  . BREAST LUMPECTOMY Right 2005?   benign lump excision  . CARDIAC CATHETERIZATION    . [place], 2016   "MVR; MVR"  . CARDIOVERSION N/A 11/06/2018   Procedure: CARDIOVERSION;  Surgeon: Minna Merritts, MD;  Location: [facility] ORS;  Service: Cardiovascular;  Laterality: N/A;  . CATARACT EXTRACTION W/PHACO Right 02/20/2015   Procedure: CATARACT EXTRACTION PHACO AND INTRAOCULAR LENS PLACEMENT ([place]);  Surgeon: Estill Cotta, MD;  Location: [facility] ORS;  Service: Ophthalmology;  Laterality: Right;  US01:31.2 AP 25.3% CDE40.13 Fluid lot # [id number]  . DRESSING CHANGE UNDER ANESTHESIA Left 11/17/2018   Procedure: DRESSING CHANGE;  Surgeon: Robert Bellow, MD;  Location: [facility] ORS;  Service: General;  Laterality: Left;  no anesthesia  . ELECTROPHYSIOLOGIC STUDY N/A 10/09/2015   Procedure: CARDIOVERSION;  Surgeon: Wellington Hampshire, MD;  Location: [facility] ORS;  Service: Cardiovascular;  Laterality: N/A;  . INCISION AND DRAINAGE OF WOUND Left 11/13/2018   Procedure: LEFT LEG DRESSING CHANGE;  Surgeon: Robert Bellow, MD;  Location: [facility] ORS;  Service: General;  Laterality: Left;  . INCISION AND DRAINAGE OF WOUND Left 11/19/2018   Procedure: LEFT LEG DRESSING CHANGE AND GRAFT APPLICATION;  Surgeon: Robert Bellow, MD;  Location: [facility] ORS;  Service: General;  Laterality: Left;  . IR GENERIC HISTORICAL  04/01/2016   IR RADIOLOGIST EVAL & MGMT 04/01/2016 [facility]  . IR GENERIC HISTORICAL  04/15/2016   IR VERTEBROPLASTY CERV/THOR BX INC UNI/BIL INC/INJECT/IMAGING 04/15/2016 Luanne Bras, MD [facility]  . IR GENERIC HISTORICAL  04/15/2016  IR VERTEBROPLASTY EA ADDL (T&LS) BX INC UNI/BIL INC INJECT/IMAGING 04/15/2016 Luanne Bras, MD [facility]  . IR GENERIC HISTORICAL  04/15/2016   IR VERTEBROPLASTY EA ADDL (T&LS) BX INC UNI/BIL INC INJECT/IMAGING 04/15/2016 Luanne Bras, MD [facility]  . IR GENERIC HISTORICAL  05/20/2016    IR RADIOLOGIST EVAL & MGMT 05/20/2016 [facility]  . IR GENERIC HISTORICAL  06/13/2016   IR VERTEBROPLASTY EA ADDL (T&LS) BX INC UNI/BIL INC INJECT/IMAGING 06/13/2016 Luanne Bras, MD [facility]  . IR GENERIC HISTORICAL  06/13/2016   IR SACROPLASTY BILATERAL 06/13/2016 Luanne Bras, MD [facility]  . IR GENERIC HISTORICAL  06/13/2016   IR VERTEBROPLASTY LUMBAR BX INC UNI/BIL INC/INJECT/IMAGING 06/13/2016 Luanne Bras, MD [facility]  . IRRIGATION AND DEBRIDEMENT HEMATOMA Left 07/05/2015   Procedure: IRRIGATION AND DEBRIDEMENT HEMATOMA;  Surgeon: Robert Bellow, MD;  Location: [facility] ORS;  Service: General;  Laterality: Left;  . IRRIGATION AND DEBRIDEMENT HEMATOMA Left 11/11/2018   Procedure: IRRIGATION AND DEBRIDEMENT LEFT LEG HEMATOMA;  Surgeon: Robert Bellow, MD;  Location: [facility] ORS;  Service: General;  Laterality: Left;  . Open reduction and internal fixation of a right intertrochanteric femur fracture  01/11/2019   Dr. Margarita Rana ([place])  . pyloric stenosis  [birth year]/1937  . US ECHOCARDIOGRAPHY     Social History   Socioeconomic History  . Marital status: Widowed    Spouse name: Not on file  . Number of children: Not on file  . Years of education: Not on file  . Highest education level: Not on file  Occupational History  . Not on file  Social Needs  . Financial resource strain: Not on file  . Food insecurity    Worry: Not on file    Inability: Not on file  . Transportation needs    Medical: Not on file    Non-medical: Not on file  Tobacco Use  . Smoking status: Former Smoker    Packs/day: 1.00    Years: 27.00    Pack years: 27.00    Types: Cigarettes    Quit date: 07/01/1980    Years since quitting: 38.5  . Smokeless tobacco: Never Used  . Tobacco comment: "quit smoking ~ 1980  Substance and Sexual Activity  . Alcohol use: Not Currently    Alcohol/week: 0.0 standard drinks    Comment: 04/10/2016 "I'll have a drink on holidays/special occasions"  .  Drug use: No  . Sexual activity: Never  Lifestyle  . Physical activity    Days per week: Not on file    Minutes per session: Not on file  . Stress: Not on file  Relationships  . Social Herbalist on phone: Not on file    Gets together: Not on file    Attends religious service: Not on file    Active member of club or organization: Not on file    Attends meetings of clubs or organizations: Not on file    Relationship status: Not on file  Other Topics Concern  . Not on file  Social History Narrative  . Not on file   Family History  Problem Relation Age of Onset  . Stroke Mother   . Renal Disease Father    Allergies  Allergen Reactions  . Ace Inhibitors     Cough  . Doxycycline Nausea Only  . Hydrocodone Itching and Nausea Only  . Mercury     Other reaction(s): Unknown  . Silver Dermatitis    Severe itching  .  Cephalexin Rash  . Clindamycin/Lincomycin Rash  . Nickel Rash  . Penicillins Rash    Has patient had a PCN reaction causing immediate rash, facial/tongue/throat swelling, SOB or lightheadedness with hypotension: YES Has patient had a PCN reaction causing severe rash involving mucus membranes or skin necrosis: NO Has patient had a PCN reaction that required hospitalization NO Has patient had a PCN reaction occurring within the last 10 years: NO If all of the above answers are "NO", then may proceed with Cephalosporin use.   Prior to Admission medications   Medication Sig Start Date End Date Taking? Authorizing Provider  acetaminophen (TYLENOL) 325 MG tablet Take 2 tablets (650 mg total) by mouth every 6 (six) hours as needed for mild pain (or Fever >/= 101). Patient taking differently: Take 975 mg by mouth 3 (three) times daily.  01/05/19  Yes Darel Hong D, NP  amiodarone (PACERONE) 200 MG tablet Take 1 tablet (200 mg total) by mouth daily. 09/24/18  Yes Gollan, Kathlene November, MD  calcium carbonate (OSCAL) 1500 (600 Ca) MG TABS tablet Take 1,500 mg by mouth  daily with breakfast.   Yes [provider]  cholecalciferol (VITAMIN D3) 10 MCG (400 UNIT) TABS tablet Take 1,600 Units by mouth daily.   Yes [provider]  diclofenac (FLECTOR) 1.3 % PTCH Place 1 patch onto the skin 2 (two) times daily.   Yes [provider]  FUROSEMIDE IN SODIUM CHLORIDE IV Inject 500 mg into the vein continuous. Rate 11ml/hr (10mg /hr)   Yes [provider]  ipratropium-albuterol (DUONEB) 0.5-2.5 (3) MG/3ML SOLN Take 3 mLs by nebulization every 6 (six) hours as needed for up to 90 doses. 12/15/18  Yes Gouru, Aruna, MD  lidocaine (LIDODERM) 5 % Place 1 patch onto the skin daily. Remove & Discard patch within 12 hours or as directed by MD   Yes [provider]  LIDOCAINE HCL IJ Inject 0.5 mLs as directed daily as needed (for PIV insertion).   Yes [provider]  Melatonin 5 MG TABS Take 1 tablet (5 mg total) by mouth at bedtime as needed (sleep). 01/05/19  Yes Darel Hong D, NP  metoprolol succinate (TOPROL-XL) 12.5 mg TB24 24 hr tablet Take 6.25 mg by mouth 2 (two) times a day.   Yes [provider]  multivitamin-lutein (OCUVITE-LUTEIN) CAPS capsule Take 1 capsule by mouth daily. 01/06/19  Yes Bradly Bienenstock, NP  ondansetron (ZOFRAN) 4 MG/2ML SOLN injection Inject 2 mLs (4 mg total) into the vein every 6 (six) hours as needed for nausea. 01/05/19  Yes Darel Hong D, NP  oxyCODONE (OXY IR/ROXICODONE) 5 MG immediate release tablet Take 2.5 mg by mouth every 4 (four) hours as needed for severe pain.   Yes [provider]  vitamin C (ASCORBIC ACID) 500 MG tablet Take 500 mg by mouth daily.   Yes [provider]  WARFARIN SODIUM PO Take 2.5 mg by mouth.    Yes [provider]  acetaminophen (TYLENOL) 650 MG suppository Place 1 suppository (650 mg total) rectally every 6 (six) hours as needed for mild pain (or Fever >/= 101). 01/05/19   Bradly Bienenstock, NP  Chlorhexidine Gluconate Cloth 2 %  PADS Apply 6 each topically daily. Patient not taking: Reported on 01/28/2019 01/06/19   Darel Hong D, NP  heparin 1000 unit/mL SOLN injection 1-6 mLs (1,000-6,000 Units total) by CRRT route as needed (Use to fill CRRT catheter with heparin 1000 units/mL per catheter volume.). Patient not taking: Reported  on 01/22/2019 01/05/19   Darel Hong D, NP  heparin 25000-0.45 UT/250ML-% infusion Inject 1,350 Units/hr into the vein continuous. Patient not taking: Reported on 01/21/2019 01/05/19   Bradly Bienenstock, NP  traMADol (ULTRAM) 50 MG tablet Take 1 tablet (50 mg total) by mouth every 6 (six) hours as needed for moderate pain. Patient not taking: Reported on 01/08/2019 01/05/19   Bradly Bienenstock, NP   No results found.  Positive ROS: All other systems have been reviewed and were otherwise negative with the exception of those mentioned in the HPI and as above.  Physical Exam: General: Well developed, well nourished female seen in no apparent discomfort. HEENT: Atraumatic and normocephalic. Sclera are clear. Extraocular motion is intact. Oropharynx is clear with moist mucosa. Cardiovascular: Pedal pulses are palpable bilaterally. Homans test is negative bilaterally. No significant pretibial or ankle edema. Skin: The patient has relatively fragile skin with multiple areas of ecchymoses.  Dressings were intact to the lower legs. Neurologic: Awake, alert, and oriented. Sensory and motor function are grossly intact.  No clonus or tremor. Good motor coordination.  MUSCULOSKELETAL: Examination of the right hip and thigh demonstrates dried blood to the proximal dressings.  Large hematomas noted proximally.  No active drainage is appreciated.  No erythema.  Assessment: Status post ORIF of right intertrochanteric femur fracture Right hip hematoma  Plan: Physical therapy notes were reviewed.  I agree with continuation of physical therapy.  I believe the patient may be weightbearing as tolerated. Continue  with local wound care.  Cold therapy to the right hip.  James P. Holley Bouche M.D.

## 2019-01-24 NOTE — Progress Notes (Addendum)
Pt didn't feel well today. She states she is "just very tired." Pt declined to have orthostatic VS done and asked if we could to them tomorrow. Also wound care order are a little confusing as far as frequency goes. Pts daughter, [name], told this RN that Dr. Leslye Peer said it was ok to do her dressing every other day. They were done yesterday and are still C/D/I. Dressings will be done tomorrow. [name] and pt are agreeable to this. Will continue monitor.

## 2019-01-25 LAB — CBC WITH DIFFERENTIAL/PLATELET
Abs Immature Granulocytes: 0.05 10*3/uL (ref 0.00–0.07)
Basophils Absolute: 0.1 10*3/uL (ref 0.0–0.1)
Basophils Relative: 1 %
Eosinophils Absolute: 0.1 10*3/uL (ref 0.0–0.5)
Eosinophils Relative: 2 %
HCT: 31.1 % — ABNORMAL LOW (ref 36.0–46.0)
Hemoglobin: 9 g/dL — ABNORMAL LOW (ref 12.0–15.0)
Immature Granulocytes: 1 %
Lymphocytes Relative: 6 %
Lymphs Abs: 0.4 10*3/uL — ABNORMAL LOW (ref 0.7–4.0)
MCH: 28.4 pg (ref 26.0–34.0)
MCHC: 28.9 g/dL — ABNORMAL LOW (ref 30.0–36.0)
MCV: 98.1 fL (ref 80.0–100.0)
Monocytes Absolute: 0.7 10*3/uL (ref 0.1–1.0)
Monocytes Relative: 10 %
Neutro Abs: 5.8 10*3/uL (ref 1.7–7.7)
Neutrophils Relative %: 80 %
Platelets: 186 10*3/uL (ref 150–400)
RBC: 3.17 MIL/uL — ABNORMAL LOW (ref 3.87–5.11)
RDW: 19.2 % — ABNORMAL HIGH (ref 11.5–15.5)
WBC: 7.1 10*3/uL (ref 4.0–10.5)
nRBC: 0 % (ref 0.0–0.2)

## 2019-01-25 LAB — PROTIME-INR
INR: 1.9 — ABNORMAL HIGH (ref 0.8–1.2)
Prothrombin Time: 21.3 seconds — ABNORMAL HIGH (ref 11.4–15.2)

## 2019-01-25 LAB — CBC
HCT: 28.8 % — ABNORMAL LOW (ref 36.0–46.0)
Hemoglobin: 8.4 g/dL — ABNORMAL LOW (ref 12.0–15.0)
MCH: 27.6 pg (ref 26.0–34.0)
MCHC: 29.2 g/dL — ABNORMAL LOW (ref 30.0–36.0)
MCV: 94.7 fL (ref 80.0–100.0)
Platelets: 179 10*3/uL (ref 150–400)
RBC: 3.04 MIL/uL — ABNORMAL LOW (ref 3.87–5.11)
RDW: 19 % — ABNORMAL HIGH (ref 11.5–15.5)
WBC: 6.9 10*3/uL (ref 4.0–10.5)
nRBC: 0 % (ref 0.0–0.2)

## 2019-01-25 MED ORDER — PANTOPRAZOLE SODIUM 40 MG PO TBEC
40.0000 mg | DELAYED_RELEASE_TABLET | Freq: Every day | ORAL | Status: DC
  Administered 2019-01-25 – 2019-02-13 (×19): 40 mg via ORAL
  Filled 2019-01-25 (×19): qty 1

## 2019-01-25 MED ORDER — WARFARIN SODIUM 4 MG PO TABS
4.0000 mg | ORAL_TABLET | Freq: Once | ORAL | Status: AC
  Administered 2019-01-25: 19:00:00 4 mg via ORAL
  Filled 2019-01-25: qty 1

## 2019-01-25 NOTE — Progress Notes (Signed)
Occupational Therapy Treatment Patient Details Name: [name] MRN: [id number] DOB: 04-18-1935 Today's Date: 01/25/2019    History of present illness  84 y.o. female with a known history of right hip repair at [facility] on 01/11/2019 secondary to mechanical fall at home, paroxysmal atrial fibrillation, mitral valve replacement on Coumadin, COPD, CKD stage III, hypertension, history of left lower extremity hematoma with evacuation and porcine graft performed on 12/25/2018.   OT comments  Pt seen for OT co-tx w/ PTA. With encouragement, pt willing to participate despite fatigue and pain. OT began session with education on AE for LB ADL tasks to promote improved independence, maximize safety, and minimize pain with activity. Catalog provided. Pt verbalized understanding of education. Pt performed bed mobility with mod/max assist +2 on this date. Pt sat EOB for ~5 min. Upon moving, PTA noted small amount of blood on floor after sup>sit transfer. RN notified and in room immediately to assess and bandage leg. Pt was wearing pressure relief booties at start/end of session. Pt encouraged to continue to participate in small bouts of activity and begin to be more active in daily ADL tasks including self feeding and grooming to support improved overall activity tolerance and minimize further functional decline, pt verbalized understanding. Pt continues to benefit from skilled OT services. Will follow POC as written. Continue to recommend STR.   Follow Up Recommendations  SNF    Equipment Recommendations  Other (comment)(TBD)    Recommendations for Other Services      Precautions / Restrictions Precautions Precautions: Fall Precaution Comments: fragile skin on length Restrictions Weight Bearing Restrictions: Yes RLE Weight Bearing: Weight bearing as tolerated       Mobility Bed Mobility Overal bed mobility: Needs Assistance Bed Mobility: Sit to Supine;Supine to Sit     Supine to  sit: Max assist;HOB elevated Sit to supine: Max assist;HOB elevated   General bed mobility comments: +2 for BLE/trunk mgt; Pt very limited with participation on this date.  Transfers                 General transfer comment: Standing deferred. Pt noted to have bleading from RLE upon sitting EOB. RN in room to apply bandage and assess.    Balance Overall balance assessment: Needs assistance Sitting-balance support: Bilateral upper extremity supported Sitting balance-Leahy Scale: Poor Sitting balance - Comments: Pt consistently leaning to L elbow or into therapist on left on this date. Limited attempts at independent seated balance. Pt reluctant to place feet on floor. With encouragement and physical assist from therapists pt able to sit EOB for ~5 min. Postural control: Posterior lean;Left lateral lean                                 ADL either performed or assessed with clinical judgement   ADL                                         General ADL Comments: Pt continues to require max to total assist for LB ADL. Pt education provided on AE to support ADL function to pt and caregiver at bedside [name]). Pt also given AE catalog to facilitate further discussion about AE to support functional independence where possible and safety.     Vision Baseline Vision/History: Wears glasses Wears Glasses: At all times Patient Visual Report:  No change from baseline     Perception     Praxis      Cognition Arousal/Alertness: Awake/alert Behavior During Therapy: WFL for tasks assessed/performed Overall Cognitive Status: Within Functional Limits for tasks assessed                                 General Comments: Pt with multiple hematomas and sore areas throughout BLEs; use caution to assist         Exercises Other Exercises Other Exercises: static during dressing change. Encouraged upright sit, use of core muscles.  Other Exercises:  Pt educated in [place] for LB ADL tasks for general LB ADL considerations after hip sx. Pt also provided with AE catalog to review for further ADL needs and AE considerations. Pt verbalized understanding of education provided, but would benefit from further reinforcement on prior education.   Shoulder Instructions       General Comments Orthostatics attempted at RN request. Pt BP while supine in bed was noted to be 108/54 at start of session. When semi-reclined with HOB to approx 90 degrees BP increased to 118/55. Once seated EOB pt noted to have BP of 103/71. Pt endorsed some dizziness and light headedness EOB. Futher transfer attempts deferred due to pt BP and need for additional bandaging on LLE.    Pertinent Vitals/ Pain       Pain Assessment: Faces Faces Pain Scale: Hurts whole lot Pain Location: BLEs and low back/sacrum. Pain Descriptors / Indicators: Aching;Guarding;Grimacing Pain Intervention(s): Limited activity within patient's tolerance;Monitored during session;Premedicated before session;Repositioned  Home Living                                          Prior Functioning/Environment              Frequency  Min 2X/week        Progress Toward Goals  OT Goals(current goals can now be found in the care plan section)  Progress towards OT goals: Progressing toward goals  Acute Rehab OT Goals Patient Stated Goal: start doing more for myself again OT Goal Formulation: With patient Time For Goal Achievement: 02/02/19 Potential to Achieve Goals: Good  Plan Discharge plan remains appropriate;Frequency remains appropriate    Co-evaluation    PT/OT/SLP Co-Evaluation/Treatment: Yes Reason for Co-Treatment: Complexity of the patient's impairments (multi-system involvement);For patient/therapist safety;To address functional/ADL transfers   OT goals addressed during session: ADL's and self-care;Proper use of Adaptive equipment and DME      AM-PAC OT "6  Clicks" Daily Activity     Outcome Measure   Help from another person eating meals?: Total Help from another person taking care of personal grooming?: A Lot Help from another person toileting, which includes using toliet, bedpan, or urinal?: Total Help from another person bathing (including washing, rinsing, drying)?: Total Help from another person to put on and taking off regular upper body clothing?: A Lot Help from another person to put on and taking off regular lower body clothing?: Total 6 Click Score: 8    End of Session Equipment Utilized During Treatment: Gait belt;Rolling walker  OT Visit Diagnosis: Muscle weakness (generalized) (M62.81);History of falling (Z91.81);Pain Pain - Right/Left: Right Pain - part of body: Hip   Activity Tolerance Patient limited by pain;Patient limited by fatigue   Patient Left in bed;with call bell/phone  within reach;with bed alarm set;Other (comment);with nursing/sitter in room(with pressure relief booties donned.)   Nurse Communication          Time: 4854-6270 OT Time Calculation (min): 36 min  Charges: OT General Charges $OT Visit: 1 Visit OT Treatments $Self Care/Home Management : 8-22 mins  Shara Blazing, M.S., OTR/L Ascom: [phone] 01/25/19, 4:18 PM

## 2019-01-25 NOTE — Consult Note (Signed)
[place] for Warfarin Dosing  Indication: Mitral Valve Replacement, A. Fib    Allergies  Allergen Reactions  . Ace Inhibitors     Cough  . Doxycycline Nausea Only  . Hydrocodone Itching and Nausea Only  . Mercury     Other reaction(s): Unknown  . Silver Dermatitis    Severe itching  . Cephalexin Rash  . Clindamycin/Lincomycin Rash  . Nickel Rash  . Penicillins Rash    Has patient had a PCN reaction causing immediate rash, facial/tongue/throat swelling, SOB or lightheadedness with hypotension: YES Has patient had a PCN reaction causing severe rash involving mucus membranes or skin necrosis: NO Has patient had a PCN reaction that required hospitalization NO Has patient had a PCN reaction occurring within the last 10 years: NO If all of the above answers are "NO", then may proceed with Cephalosporin use.    Patient Measurements: Height: 4\' 11"  (149.9 cm) Weight: 118 lb 9.6 oz (53.8 kg) IBW/kg (Calculated) : 43.2   Vital Signs: Temp: 98.5 F (36.9 C) (07/27 0807) Temp Source: Oral (07/27 0807) BP: 105/58 (07/27 0807) Pulse Rate: 59 (07/27 0807)  Labs: Recent Labs    01/23/19 0516 01/24/19 0440 01/25/19 0540  HGB 9.6* 8.6* 8.4*  HCT  --  29.1* 28.8*  PLT  --  178 179  LABPROT 25.5* 23.7* 21.3*  INR 2.4* 2.2* 1.9*  CREATININE 1.25* 1.16*  --     Estimated Creatinine Clearance: 28.5 mL/min (A) (by C-G formula based on SCr of 1.16 mg/dL (H)).   Medical History: Past Medical History:  Diagnosis Date  . Arthritis    "back, hands, knees" (04/10/2016)  . Chronic back pain   . 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 EF 25% by echo in 08/2015 b. RHC in 08/2015 showed normal filling pressures; c. 10/2018 Echo (in setting of atrial tachycardia): EF 25-30%, impaired relaxation. Glob HK. RVSP 61mmHg. Mod dil RA/LA. Mech MV w/ nl fxn (mean grad 61mmHg). Mod TR.   Marland Kitchen Chronic kidney  disease   . Compression fracture    "several; all in my back" (04/10/2016)  . GERD (gastroesophageal reflux disease)   . Hypertension   . Lymphedema   . Pacemaker 05/2017  . PAF (paroxysmal atrial fibrillation) ([place])    a. On amio & Coumadin - CHA2DS2VASc = 4.  . S/P MVR (mitral valve replacement)    a. MVR 1994 b. redo MVR in 08/2014 - on Coumadin  . Shortness of breath dyspnea    Assessment: Pharmacy consulted for warfarin dosing and monitoring for 84 yo female with PMH of mitral valve replacement and A. Fib. Patient transferred to [facility] 7/20 PM from [place]. \  Home dose: warfarin 2.5 mg daily.   DDI: on amio (increase INR)   DATE INR DOSE 7/17  2mg  7/18 1.6 3mg  7/19 2.0 2.5mg  7/20 2.6       2mg  ( According to [facility] records patient did not receive a dose of warfarin 7/20.) 7/21     2.7       2 mg 7/22 2.8 1 mg 7/23 3.0 1 mg - did not receive.  7/24     2.7       2 mg  7/25 2.4 2 mg 7/26 2.2 2.5 mg 7/27     1.9       4 mg  Goal of Therapy:  INR 2.5-3.5 (ideally want INR between 2.5 to 3  per previous notes) Monitor platelets by anticoagulation protocol: Yes   Plan:  INR level is subtherapeutic  No new mediations started to effect INR. Unsure why INR is trending down.  Plan to give Warfarin 4 mg x 1 tonight.  Daily INR ordered. Continue to monitor CBC.   Spoke to MD on 7/22 regarding low Hgb- okay with continuing warfarin at lower dose.  Pharmacy will continue to follow and order warfarin based on INRs  Oswald Hillock, PharmD, BCPS 01/25/2019 8:18 AM

## 2019-01-25 NOTE — Progress Notes (Signed)
   01/25/19 1600  PT Visit Information  Last PT Received On 01/25/19  Assistance Needed +2  History of Present Illness  84 y.o. female with a known history of right hip repair at [facility] on 01/11/2019 secondary to mechanical fall at home, paroxysmal atrial fibrillation, mitral valve replacement on Coumadin, COPD, CKD stage III, hypertension, history of left lower extremity hematoma with evacuation and porcine graft performed on 12/25/2018.  Precautions  Precautions Fall  Precaution Comments fragile skin  Restrictions  Weight Bearing Restrictions Yes  RLE Weight Bearing WBAT  Pain Assessment  Pain Assessment Faces  Faces Pain Scale 8  Pain Location general throughout LEs and back with bed mobility  Cognition  Arousal/Alertness Awake/alert  Behavior During Therapy WFL for tasks assessed/performed  Overall Cognitive Status Within Functional Limits for tasks assessed  Bed Mobility  Overal bed mobility Needs Assistance  Bed Mobility Sit to Supine;Supine to Sit  Supine to sit Mod assist;Max assist  Sit to supine Total assist;+2 for physical assistance  General bed mobility comments Pt can assist with BLE advancement to edge of bed to sit and Min A with long sit scoot of hips to edge of bed. Total due to fatigue post extended sit edge of bed  Balance  Overall balance assessment Needs assistance  Sitting-balance support Feet supported;Bilateral upper extremity supported  Sitting balance-Leahy Scale Poor  Sitting balance - Comments Heavy posterior leans although pt does occasionally and temporarily forward flexes trunk toward upright sit with short term Min A to support before heavy posterior lean again. Left lean as well as pt fatigues.   Postural control Left lateral lean;Posterior lean  Other Exercises  Other Exercises static during dressing change. Encouraged upright sit, use of core muscles.   PT - End of Session  Activity Tolerance Patient limited by pain;Patient limited by fatigue   Patient left in bed;with call bell/phone within reach;with bed alarm set;with nursing/sitter in room;Other (comment) (protective boots in place)   PT - Assessment/Plan  PT Plan Current plan remains appropriate  PT Visit Diagnosis Muscle weakness (generalized) (M62.81);Difficulty in walking, not elsewhere classified (R26.2);Pain  Pain - Right/Left Right  Pain - part of body Hip  PT Frequency (ACUTE ONLY) Min 2X/week  Follow Up Recommendations SNF;Other (comment)  AM-PAC PT "6 Clicks" Mobility Outcome Measure (Version 2)  Help needed turning from your back to your side while in a flat bed without using bedrails? 2  Help needed moving from lying on your back to sitting on the side of a flat bed without using bedrails? 2  Help needed moving to and from a bed to a chair (including a wheelchair)? 1  Help needed standing up from a chair using your arms (e.g., wheelchair or bedside chair)? 1  Help needed to walk in hospital room? 1  Help needed climbing 3-5 steps with a railing?  1  6 Click Score 8  Consider Recommendation of Discharge To: CIR/SNF/LTACH  PT Goal Progression  Progress towards PT goals Progressing toward goals  PT Time Calculation  PT Start Time (ACUTE ONLY) 1531  PT Stop Time (ACUTE ONLY) 1551  PT Time Calculation (min) (ACUTE ONLY) 20 min  PT General Charges  $$ ACUTE PT VISIT 1 Visit  PT Treatments  $Therapeutic Activity 8-22 mins

## 2019-01-25 NOTE — Progress Notes (Signed)
Subjective:    Patient reports pain as mild.   Patient is well, and has had no acute complaints or problems We will start therapy today.  Plan is to go Rehab after hospital stay. no nausea and no vomiting Patient denies any chest pains or shortness of breath. Appears to be resting comfortably.  Did not appear to have a lot of discomfort when patient was rolled onto her side.  Objective: Vital signs in last 24 hours: Temp:  [98.1 F (36.7 C)-98.5 F (36.9 C)] 98.5 F (36.9 C) (07/27 0807) Pulse Rate:  [59-82] 59 (07/27 0807) Resp:  [18-19] 19 (07/27 0807) BP: (105-124)/(50-62) 105/58 (07/27 0807) SpO2:  [96 %-99 %] 97 % (07/27 0807) Weight:  [53.8 kg] 53.8 kg (07/27 0551) well approximated incision Heels are non tender and elevated off the bed using rolled towels Intake/Output from previous day: 07/26 0701 - 07/27 0700 In: 3 [I.V.:3] Out: 1025 [Urine:1025] Intake/Output this shift: Total I/O In: 3 [I.V.:3] Out: 375 [Urine:375]  Recent Labs    01/23/19 0516 01/24/19 0440 01/25/19 0540  HGB 9.6* 8.6* 8.4*   Recent Labs    01/24/19 0440 01/25/19 0540  WBC 6.7 6.9  RBC 3.02* 3.04*  HCT 29.1* 28.8*  PLT 178 179   Recent Labs    01/23/19 0516 01/24/19 0440  NA 138 136  K 3.8 3.7  CL 88* 89*  CO2 42* 40*  BUN 38* 36*  CREATININE 1.25* 1.16*  GLUCOSE 88 83  CALCIUM 9.2 8.7*   Recent Labs    01/24/19 0440 01/25/19 0540  INR 2.2* 1.9*    EXAM General - Patient is Alert Extremity - Neurologically intact Neurovascular intact Sensation intact distally Intact pulses distally Compartment soft Right hip still shows quite a bit of swelling but the bruising from the hematoma for the most part has resolved except right around the incision site. Dressing - moderate drainage Motor Function - intact, moving foot and toes well on exam.    Past Medical History:  Diagnosis Date  . Arthritis    "back, hands, knees" (04/10/2016)  . Chronic back pain   .  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 EF 25% by echo in 08/2015 b. RHC in 08/2015 showed normal filling pressures; c. 10/2018 Echo (in setting of atrial tachycardia): EF 25-30%, impaired relaxation. Glob HK. RVSP 61mmHg. Mod dil RA/LA. Mech MV w/ nl fxn (mean grad 1mmHg). Mod TR.   Marland Kitchen Chronic kidney disease   . Compression fracture    "several; all in my back" (04/10/2016)  . GERD (gastroesophageal reflux disease)   . Hypertension   . Lymphedema   . Pacemaker 05/2017  . PAF (paroxysmal atrial fibrillation) ([place])    a. On amio & Coumadin - CHA2DS2VASc = 4.  . S/P MVR (mitral valve replacement)    a. MVR 1994 b. redo MVR in 08/2014 - on Coumadin  . Shortness of breath dyspnea     Assessment/Plan:    Active Problems:   Hip fracture requiring operative repair ([place])  Estimated body mass index is 23.95 kg/m as calculated from the following:   Height as of this encounter: 4\' 11"  (1.499 m).   Weight as of this encounter: 53.8 kg. Up with therapy Discharge to SNF when medically stable  Labs: Hemoglobin 8.4 with a INR 1.9 DVT Prophylaxis - Coumadin and TED hose Weight-Bearing as tolerated to right leg Dressings to incision site of right hip need to change  today Will need to follow-up in [place] clinic 4 weeks Continue TED stockings for 6 weeks postop Staples hopefully can be removed in the next few days once the wounds are dry  [name] R. [place] 01/25/2019, 12:04 PM

## 2019-01-25 NOTE — Care Management Important Message (Signed)
Important Message  Patient Details  Name: [name] MRN: [id number] Date of Birth: [name] 03, 1935   Medicare Important Message Given:  Yes     [name] 01/25/2019, 12:06 PM

## 2019-01-25 NOTE — Progress Notes (Signed)
Patient ID: [name], female   DOB: [birth year], 84 y.o.   MRN: [id number]   [facility] PROGRESS NOTE  [name] [id number]:[id number] DOB: 10/25/1934 DOA: 01/19/2019 PCP: Rusty Aus, MD  HPI/Subjective:  Patient laying in bed daughter at bedside. I have been told by the patient and daughter that they will not leave the hospital they feel that mother is not ready       Objective: Vitals:   01/25/19 0409 01/25/19 0807  BP: 124/62 (!) 105/58  Pulse: 82 (!) 59  Resp: 18 19  Temp: 98.2 F (36.8 C) 98.5 F (36.9 C)  SpO2: 96% 97%    Intake/Output Summary (Last 24 hours) at 01/25/2019 1337 Last data filed at 01/25/2019 0854 Gross per 24 hour  Intake 3 ml  Output 1400 ml  Net -1397 ml   Filed Weights   01/23/19 0138 01/24/19 0407 01/25/19 0551  Weight: 53.2 kg 55.1 kg 53.8 kg    ROS: Review of Systems  Constitutional: Negative for chills and fever.  Eyes: Negative for blurred vision.  Respiratory: Negative for cough and shortness of breath.   Cardiovascular: Negative for chest pain.  Gastrointestinal: Negative for abdominal pain, diarrhea, nausea and vomiting.  Genitourinary: Negative for dysuria.  Musculoskeletal: Positive for joint pain.  Neurological: Negative for dizziness and headaches.   Exam: Physical Exam  HENT:  Nose: No mucosal edema.  Mouth/Throat: No oropharyngeal exudate or posterior oropharyngeal edema.  Eyes: Pupils are equal, round, and reactive to light. Conjunctivae, EOM and lids are normal.  Neck: No JVD present. Carotid bruit is not present. No edema present. No thyroid mass and no thyromegaly present.  Cardiovascular: S1 normal and S2 normal. Exam reveals no gallop.  Murmur heard.  Systolic murmur is present with a grade of 3/6. Pulses:      Dorsalis pedis pulses are 2+ on the right side and 2+ on the left side.  Respiratory: No respiratory distress. She has decreased breath sounds in the right lower field and the left  lower field. She has no wheezes. She has no rhonchi. She has no rales.  GI: Soft. Bowel sounds are normal. There is no abdominal tenderness.  Musculoskeletal:     Right ankle: She exhibits swelling.     Left ankle: She exhibits swelling.  Lymphadenopathy:    She has no cervical adenopathy.  Neurological: She is alert. No cranial nerve deficit.  Skin: Skin is warm. Nails show no clubbing.  Large right hip hematoma.  Psychiatric: She has a normal mood and affect.      Data Reviewed: Basic Metabolic Panel: Recent Labs  Lab 01/19/19 0026 01/20/19 0508 01/22/19 0604 01/23/19 0516 01/24/19 0440  NA 136 134* 139 138 136  K 3.7 3.5 3.2* 3.8 3.7  CL 83* 83* 87* 88* 89*  CO2 43* 42* 45* 42* 40*  GLUCOSE 99 92 95 88 83  BUN 72* 70* 48* 38* 36*  CREATININE 1.63* 1.54* 1.31* 1.25* 1.16*  CALCIUM 8.8* 8.6* 8.9 9.2 8.7*   CBC: Recent Labs  Lab 01/20/19 0508 01/21/19 0425 01/22/19 0604 01/23/19 0516 01/24/19 0440 01/25/19 0540  WBC 8.6 8.4 7.0  --  6.7 6.9  HGB 7.5* 7.5* 8.8* 9.6* 8.6* 8.4*  HCT 25.2* 25.6* 29.3*  --  29.1* 28.8*  MCV 94.0 94.5 93.9  --  96.4 94.7  PLT 181 203 189  --  178 179   BNP (last 3 results) Recent Labs    11/04/18 1141 12/04/18 1627 12/04/18  1954  BNP 505.0* 1,189.0* 992.0*     Recent Results (from the past 240 hour(s))  Novel Coronavirus,NAA,(SEND-OUT TO REF LAB - TAT 24-48 hrs); Hosp Order     Status: None   Collection Time: 01/19/19 12:10 AM   Specimen: Nasopharyngeal Swab; Respiratory  Result Value Ref Range Status   SARS-CoV-2, NAA NOT DETECTED NOT DETECTED Final    Comment: (NOTE) This test was developed and its performance characteristics determined by [employer]. This test has not been FDA cleared or approved. This test has been authorized by FDA under an Emergency Use Authorization (EUA). This test is only authorized for the duration of time the declaration that circumstances exist justifying the authorization of the  emergency use of in vitro diagnostic tests for detection of SARS-CoV-2 virus and/or diagnosis of COVID-19 infection under section 564(b)(1) of the Act, 21 U.S.C. 254YHC-6(C)(3), unless the authorization is terminated or revoked sooner. When diagnostic testing is negative, the possibility of a false negative result should be considered in the context of a patient's recent exposures and the presence of clinical signs and symptoms consistent with COVID-19. An individual without symptoms of COVID-19 and who is not shedding SARS-CoV-2 virus would expect to have a negative (not detected) result in this assay. Performed  At: [facility] [place], [place] [id number] Rush Farmer MD [place]:[id number]    [place]  Final    Comment: Performed at [facility], [address] [place], [place] [address]     Studies: No results found.  Scheduled Meds: . acetaminophen  650 mg Oral Once  . amiodarone  200 mg Oral Daily  . docusate sodium  100 mg Oral BID  . feeding supplement (ENSURE ENLIVE)  237 mL Oral BID BM  . furosemide  20 mg Oral BID  . mouth rinse  15 mL Mouth Rinse BID  . multivitamin-lutein  1 capsule Oral Daily  . pantoprazole  40 mg Oral Daily  . sodium chloride flush  3 mL Intravenous Q12H  . vitamin C  500 mg Oral Daily  . warfarin  4 mg Oral ONCE-1800  . Warfarin - Pharmacist Dosing Inpatient   Does not apply q1800   Continuous Infusions:  Assessment/Plan:  1. Acute on chronic combined systolic and diastolic heart failure.  Continue oral Lasix patient stable 2. Paroxysmal atrial fibrillation and history of mitral valve replacement on amiodarone and Coumadin 3. Acute blood loss anemia secondary to large right hip hematoma and skin tear right leg.  Also has other hematomas on the legs.  Hemoglobin stable 4. Hip fracture status post operative repair.  Physical therapy recommending rehab.  Discussed with daughter and  patient they state that they do not want her mother to go anywhere 5. Weakness.  Physical therapy evaluation recommending rehab 6. Chronic kidney disease stage III.  Watch with diuresis 7. Stage II to borderline stage III sacral decubiti.  Will local wound care.  Must change positions 8. Large right hip hematoma.  Needs to be on Coumadin secondary to metallic mitral valve.  Continue to monitor.  Family requesting orthopedic surgery to see her while she is here.  Placed a consult for [facility] orthopedics who saw the patient last time. 9. Skin tear right lower extremity.  Supportive care  Code Status:     Code Status Orders  (From admission, onward)         Start     Ordered   01/19/19 0004  Do not attempt resuscitation (DNR)  Continuous    Question Answer Comment  In the event of cardiac or respiratory ARREST Do not call a "code blue"   In the event of cardiac or respiratory ARREST Do not perform Intubation, CPR, defibrillation or ACLS   In the event of cardiac or respiratory ARREST Use medication by any route, position, wound care, and other measures to relive pain and suffering. May use oxygen, suction and manual treatment of airway obstruction as needed for comfort.      01/19/19 0003        Code Status History    Date Active Date Inactive Code Status Order ID Comments User Context   01/23/2019 2311 01/19/2019 0003 Full Code [id number]  Mayer Camel, NP Inpatient   01/27/2019 2309 01/03/2019 2311 Full Code [id number]  Mayer Camel, NP Inpatient   12/30/2018 1220 01/06/2019 0608 DNR [id number]  Ottie Glazier, MD Inpatient   12/26/2018 0147 12/30/2018 1220 Full Code [id number]  Lance Coon, MD Inpatient   12/04/2018 1937 12/15/2018 1643 Full Code [id number]  Mayer Camel, NP Inpatient   11/24/2018 1621 11/30/2018 2027 Full Code [id number]  Fritzi Mandes, MD Inpatient   11/24/2018 1557 11/24/2018 1620 DNR [id number]  Fritzi Mandes, MD Inpatient   11/09/2018 0333 11/24/2018 1557 Full Code [id number]  Mayer Camel, NP ED   11/04/2018 1650 11/08/2018 2006 Full Code [id number]  Saundra Shelling, MD Inpatient   04/10/2016 2034 04/19/2016 1757 Full Code [id number]  Vianne Bulls, MD Inpatient   09/29/2015 1053 10/10/2015 1406 Full Code [id number]  [name], MD Inpatient   04/28/2015 1626 04/30/2015 1422 Full Code [id number]  Demetrios Loll, MD Inpatient   Advance Care Planning Activity    Advance Directive Documentation     Most Recent Value  Type of Advance Directive  Out of facility DNR (pink MOST or yellow form)  Pre-existing out of facility DNR order (yellow form or pink MOST form)  Yellow form placed in chart (order not valid for inpatient use)  "MOST" Form in Place?  -     Family Communication: Daughter on the phone Disposition Plan: To be determined based on clinical course  Time spent: 28 minutes  Satoru Milich [employer] [employer]

## 2019-01-25 NOTE — TOC Progression Note (Signed)
Transition of Care [facility]) - Progression Note    Patient Details  Name: [name] MRN: [id number] Date of Birth: October 22, 1934  Transition of Care [facility]) CM/SW Contact  Shade Flood, LCSW Phone Number: 01/25/2019, 12:24 PM  Clinical Narrative:     TOC following. Pt status discussed with MD in Progression today. Per MD, pt not being discharged today. Spoke with Seth Bake at [facility]. She is not anticipating any availability this week. Previous TOC notes indicate that pt/family only interested in [place]. Anticipating pt will return home with Home Health and DME as needed.  Will follow up tomorrow.  Expected Discharge Plan: (home with home health vs SNF)    Expected Discharge Plan and Services Expected Discharge Plan: (home with home health vs SNF)   Discharge Planning Services: CM Consult   Living arrangements for the past 2 months: [facility] settiings)                 DME Arranged: (Heads up to [facility] with [facility] for hospital bed. Have entered medical necissity for the bed) DME Agency: [facility]     Representative spoke with at DME Agency: [place] Arranged: RN, PT, OT, Nurse's Aide [place] Agency: [place] ([place]) Date [place]: 01/19/19       Social Determinants of Health (SDOH) Interventions    Readmission Risk Interventions Readmission Risk Prevention Plan 12/26/2018 12/07/2018 11/30/2018  Transportation Screening Complete Complete Complete  PCP or Specialist Appt within 3-5 Days - Complete -  HRI or Home Care Consult Complete Complete -  Social Work Consult for [place] Planning/Counseling Complete Not Complete -  SW consult not completed comments - NA -  Palliative Care Screening - Not Applicable -  Medication Review Press photographer) Complete Complete Complete  PCP or Specialist appointment within 3-5 days of discharge - - Complete  HRI or [place] - - Complete  SW Recovery Care/Counseling Consult - - Not Complete  SW  Consult Not Complete Comments - - na  Palliative Care Screening - - Not [place] - - Complete  Some recent data might be hidden

## 2019-01-26 LAB — BASIC METABOLIC PANEL
Anion gap: 10 (ref 5–15)
BUN: 29 mg/dL — ABNORMAL HIGH (ref 8–23)
CO2: 39 mmol/L — ABNORMAL HIGH (ref 22–32)
Calcium: 8.6 mg/dL — ABNORMAL LOW (ref 8.9–10.3)
Chloride: 89 mmol/L — ABNORMAL LOW (ref 98–111)
Creatinine, Ser: 1.2 mg/dL — ABNORMAL HIGH (ref 0.44–1.00)
GFR calc Af Amer: 49 mL/min — ABNORMAL LOW (ref >60)
GFR calc non Af Amer: 42 mL/min — ABNORMAL LOW (ref >60)
Glucose, Bld: 83 mg/dL (ref 70–99)
Potassium: 3.4 mmol/L — ABNORMAL LOW (ref 3.5–5.1)
Sodium: 138 mmol/L (ref 135–145)

## 2019-01-26 LAB — CBC
HCT: 30.6 % — ABNORMAL LOW (ref 36.0–46.0)
Hemoglobin: 8.9 g/dL — ABNORMAL LOW (ref 12.0–15.0)
MCH: 28.2 pg (ref 26.0–34.0)
MCHC: 29.1 g/dL — ABNORMAL LOW (ref 30.0–36.0)
MCV: 96.8 fL (ref 80.0–100.0)
Platelets: 190 10*3/uL (ref 150–400)
RBC: 3.16 MIL/uL — ABNORMAL LOW (ref 3.87–5.11)
RDW: 19.1 % — ABNORMAL HIGH (ref 11.5–15.5)
WBC: 6.6 10*3/uL (ref 4.0–10.5)
nRBC: 0 % (ref 0.0–0.2)

## 2019-01-26 LAB — PROTIME-INR
INR: 1.9 — ABNORMAL HIGH (ref 0.8–1.2)
Prothrombin Time: 21.7 seconds — ABNORMAL HIGH (ref 11.4–15.2)

## 2019-01-26 MED ORDER — POTASSIUM CHLORIDE CRYS ER 20 MEQ PO TBCR
40.0000 meq | EXTENDED_RELEASE_TABLET | Freq: Once | ORAL | Status: AC
  Administered 2019-01-26: 40 meq via ORAL
  Filled 2019-01-26: qty 2

## 2019-01-26 MED ORDER — WARFARIN SODIUM 3 MG PO TABS
3.0000 mg | ORAL_TABLET | Freq: Once | ORAL | Status: AC
  Administered 2019-01-26: 3 mg via ORAL
  Filled 2019-01-26: qty 1

## 2019-01-26 NOTE — Progress Notes (Signed)
   01/26/19 1100  Clinical Encounter Type  Visited With Patient  Visit Type Follow-up  Ch followed up. Pt was sitting up in bed and said she was feeling better. Ch asked her about her wishes once she gets to go home and her past hobbies. She enjoyed cooking and horseback riding and had several kind animals on her property. Ch spent some time with her to build rapport and to get to know each other. Pt is in good mood and is expecting her daughter [name] to come visit her today. Ch will continue to be part of the care.

## 2019-01-26 NOTE — Progress Notes (Addendum)
Patient ID: [name], female   DOB: [birth year], 84 y.o.   MRN: [id number]   [facility] PROGRESS NOTE  [name] [id number] DOB: [birth year] DOA: 12/31/2018 PCP: Rusty Aus, MD  HPI/Subjective:  Complaint of weakness no pain      Objective: Vitals:   01/26/19 0339 01/26/19 0741  BP: (!) 99/50 (!) 108/55  Pulse: 67 69  Resp: 20   Temp: 98 F (36.7 C) 98.5 F (36.9 C)  SpO2: 100% 98%    Intake/Output Summary (Last 24 hours) at 01/26/2019 1356 Last data filed at 01/26/2019 1003 Gross per 24 hour  Intake 0 ml  Output 300 ml  Net -300 ml   Filed Weights   01/23/19 0138 01/24/19 0407 01/25/19 0551  Weight: 53.2 kg 55.1 kg 53.8 kg    ROS: Review of Systems  Constitutional: Negative for chills and fever.  Eyes: Negative for blurred vision.  Respiratory: Negative for cough and shortness of breath.   Cardiovascular: Negative for chest pain.  Gastrointestinal: Negative for abdominal pain, diarrhea, nausea and vomiting.  Genitourinary: Negative for dysuria.  Musculoskeletal: Positive for joint pain.  Neurological: Negative for dizziness and headaches.   Exam: Physical Exam  HENT:  Nose: No mucosal edema.  Mouth/Throat: No oropharyngeal exudate or posterior oropharyngeal edema.  Eyes: Pupils are equal, round, and reactive to light. Conjunctivae, EOM and lids are normal.  Neck: No JVD present. Carotid bruit is not present. No edema present. No thyroid mass and no thyromegaly present.  Cardiovascular: S1 normal and S2 normal. Exam reveals no gallop.  Murmur heard.  Systolic murmur is present with a grade of 3/6. Pulses:      Dorsalis pedis pulses are 2+ on the right side and 2+ on the left side.  Respiratory: No respiratory distress. She has decreased breath sounds in the right lower field and the left lower field. She has no wheezes. She has no rhonchi. She has no rales.  GI: Soft. Bowel sounds are normal. There is no abdominal tenderness.   Musculoskeletal:     Right ankle: She exhibits swelling.     Left ankle: She exhibits swelling.  Lymphadenopathy:    She has no cervical adenopathy.  Neurological: She is alert. No cranial nerve deficit.  Skin: Skin is warm. Nails show no clubbing.  Psychiatric: She has a normal mood and affect.                      Data Reviewed: Basic Metabolic Panel: Recent Labs  Lab 01/20/19 0508 01/22/19 0604 01/23/19 0516 01/24/19 0440 01/26/19 0346  NA 134* 139 138 136 138  K 3.5 3.2* 3.8 3.7 3.4*  CL 83* 87* 88* 89* 89*  CO2 42* 45* 42* 40* 39*  GLUCOSE 92 95 88 83 83  BUN 70* 48* 38* 36* 29*  CREATININE 1.54* 1.31* 1.25* 1.16* 1.20*  CALCIUM 8.6* 8.9 9.2 8.7* 8.6*   CBC: Recent Labs  Lab 01/22/19 0604 01/23/19 0516 01/24/19 0440 01/25/19 0540 01/25/19 1432 01/26/19 0346  WBC 7.0  --  6.7 6.9 7.1 6.6  NEUTROABS  --   --   --   --  5.8  --   HGB 8.8* 9.6* 8.6* 8.4* 9.0* 8.9*  HCT 29.3*  --  29.1* 28.8* 31.1* 30.6*  MCV 93.9  --  96.4 94.7 98.1 96.8  PLT 189  --  178 179 186 190   BNP (last 3 results) Recent Labs    11/04/18 1141  12/04/18 1627 12/04/18 1954  BNP 505.0* 1,189.0* 992.0*     Recent Results (from the past 240 hour(s))  Novel Coronavirus,NAA,(SEND-OUT TO REF LAB - TAT 24-48 hrs); Hosp Order     Status: None   Collection Time: 01/19/19 12:10 AM   Specimen: Nasopharyngeal Swab; Respiratory  Result Value Ref Range Status   SARS-CoV-2, NAA NOT DETECTED NOT DETECTED Final    Comment: (NOTE) This test was developed and its performance characteristics determined by [employer]. This test has not been FDA cleared or approved. This test has been authorized by FDA under an Emergency Use Authorization (EUA). This test is only authorized for the duration of time the declaration that circumstances exist justifying the authorization of the emergency use of in vitro diagnostic tests for detection of SARS-CoV-2 virus and/or diagnosis of  COVID-19 infection under section 564(b)(1) of the Act, 21 U.S.C. 259DGL-8(V)(5), unless the authorization is terminated or revoked sooner. When diagnostic testing is negative, the possibility of a false negative result should be considered in the context of a patient's recent exposures and the presence of clinical signs and symptoms consistent with COVID-19. An individual without symptoms of COVID-19 and who is not shedding SARS-CoV-2 virus would expect to have a negative (not detected) result in this assay. Performed  At: [facility] [place], [place] [id number] Rush Farmer MD AC:[id number]    [place]  Final    Comment: Performed at [facility], [address] [place],  [address]     Studies: No results found.  Scheduled Meds: . acetaminophen  650 mg Oral Once  . amiodarone  200 mg Oral Daily  . docusate sodium  100 mg Oral BID  . furosemide  20 mg Oral BID  . mouth rinse  15 mL Mouth Rinse BID  . multivitamin-lutein  1 capsule Oral Daily  . pantoprazole  40 mg Oral Daily  . potassium chloride  40 mEq Oral Once  . sodium chloride flush  3 mL Intravenous Q12H  . vitamin C  500 mg Oral Daily  . warfarin  3 mg Oral ONCE-1800  . Warfarin - Pharmacist Dosing Inpatient   Does not apply q1800   Continuous Infusions:  Assessment/Plan:  1. Acute on chronic combined systolic and diastolic heart failure.  Continue oral Lasix patient stable 2. Paroxysmal atrial fibrillation and history of mitral valve replacement on amiodarone and Coumadin 3. Acute blood loss anemia secondary to large right hip hematoma and skin tear right leg.  Also has other hematomas on the legs.  Hemoglobin stable 4. Hip fracture status post operative repair.  Physical therapy recommending rehab.  Discussed with daughter and patient they state that they do not want her mother to go anywhere 5. Weakness.  Physical therapy evaluation recommending  rehab 6. Chronic kidney disease stage III.  Watch with diuresis 7. Stage II to borderline stage III sacral decubiti.  Will local wound care.  Must change positions 8. Large right hip hematoma.  Needs to be on Coumadin secondary to metallic mitral valve.  Continue to monitor.  Patient has been followed by orthopedics here  9. skin tear right lower extremity.  Supportive care   Discussed the case with  Dr. Marla Roe (mobile phone [phone]) [phone])   who has reviewed patient's wound and states that they seem to be healing well.  She recommends possible Unna boot      Code Status:     Code Status Orders  (From admission, onward)  Start     Ordered   01/19/19 0004  Do not attempt resuscitation (DNR)  Continuous    Question Answer Comment  In the event of cardiac or respiratory ARREST Do not call a "code blue"   In the event of cardiac or respiratory ARREST Do not perform Intubation, CPR, defibrillation or ACLS   In the event of cardiac or respiratory ARREST Use medication by any route, position, wound care, and other measures to relive pain and suffering. May use oxygen, suction and manual treatment of airway obstruction as needed for comfort.      01/19/19 0003        Code Status History    Date Active Date Inactive Code Status Order ID Comments User Context   01/03/2019 2311 01/19/2019 0003 Full Code [id number]  Mayer Camel, NP Inpatient   01/25/2019 2309 01/01/2019 2311 Full Code [id number]  Mayer Camel, NP Inpatient   12/30/2018 1220 01/06/2019 0608 DNR [id number]  Ottie Glazier, MD Inpatient   12/26/2018 0147 12/30/2018 1220 Full Code [id number]  Lance Coon, MD Inpatient   12/04/2018 1937 12/15/2018 1643 Full Code [id number]  Mayer Camel, NP Inpatient   11/24/2018 1621 11/30/2018 2027 Full Code [id number]  Fritzi Mandes, MD Inpatient   11/24/2018 1557 11/24/2018 1620 DNR [id number]  Fritzi Mandes, MD Inpatient   11/09/2018 0333 11/24/2018 1557 Full Code [id number]  Mayer Camel, NP  ED   11/04/2018 1650 11/08/2018 2006 Full Code [id number]  Saundra Shelling, MD Inpatient   04/10/2016 2034 04/19/2016 1757 Full Code [id number]  Vianne Bulls, MD Inpatient   09/29/2015 1053 10/10/2015 1406 Full Code [id number]  [name], MD Inpatient   04/28/2015 1626 04/30/2015 1422 Full Code [id number]  Demetrios Loll, MD Inpatient   Advance Care Planning Activity    Advance Directive Documentation     Most Recent Value  Type of Advance Directive  Out of facility DNR (pink MOST or yellow form)  Pre-existing out of facility DNR order (yellow form or pink MOST form)  Yellow form placed in chart (order not valid for inpatient use)  "MOST" Form in Place?  -     Family Communication: Daughter on the phone Disposition Plan: To be determined based on clinical course  Time spent: 28 minutes  Erskin Zinda [employer]

## 2019-01-26 NOTE — Progress Notes (Signed)
Subjective:    Patient reports pain as moderate.  States that she hurts all over but this is not new for her. Patient is well, and has had no acute complaints or problems Patient was able to sit up on the side of bed yesterday with therapy Plan is to go Rehab after hospital stay. no nausea and no vomiting Patient denies any chest pains or shortness of breath. No new complaints  Objective: Vital signs in last 24 hours: Temp:  [97.6 F (36.4 C)-98.5 F (36.9 C)] 98.5 F (36.9 C) (07/28 0741) Pulse Rate:  [67-76] 69 (07/28 0741) Resp:  [20] 20 (07/28 0339) BP: (99-113)/(47-55) 108/55 (07/28 0741) SpO2:  [98 %-100 %] 98 % (07/28 0741) well approximated incision Heels are non tender and elevated off the bed using rolled towels Intake/Output from previous day: 07/27 0701 - 07/28 0700 In: 3 [I.V.:3] Out: 675 [Urine:675] Intake/Output this shift: No intake/output data recorded.  Recent Labs    01/24/19 0440 01/25/19 0540 01/25/19 1432 01/26/19 0346  HGB 8.6* 8.4* 9.0* 8.9*   Recent Labs    01/25/19 1432 01/26/19 0346  WBC 7.1 6.6  RBC 3.17* 3.16*  HCT 31.1* 30.6*  PLT 186 190   Recent Labs    01/24/19 0440 01/26/19 0346  NA 136 138  K 3.7 3.4*  CL 89* 89*  CO2 40* 39*  BUN 36* 29*  CREATININE 1.16* 1.20*  GLUCOSE 83 83  CALCIUM 8.7* 8.6*   Recent Labs    01/25/19 0540 01/26/19 0346  INR 1.9* 1.9*    EXAM General - Patient is Alert, Appropriate and Oriented Extremity - Neurologically intact Neurovascular intact Sensation intact distally Intact pulses distally Dorsiflexion/Plantar flexion intact No cellulitis present Dressing - Distal incision site shows no drainage and appears to be doing well.  Proximal incision site however does have some dark dried blood with minimal drainage at this time.  However nurses note states that she had quite a bit yesterday but is slowed. Motor Function - intact, moving foot and toes well on exam.   Past Medical  History:  Diagnosis Date  . Arthritis    "back, hands, knees" (04/10/2016)  . Chronic back pain   . 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 EF 25% by echo in 08/2015 b. RHC in 08/2015 showed normal filling pressures; c. 10/2018 Echo (in setting of atrial tachycardia): EF 25-30%, impaired relaxation. Glob HK. RVSP 60mmHg. Mod dil RA/LA. Mech MV w/ nl fxn (mean grad 51mmHg). Mod TR.   Marland Kitchen Chronic kidney disease   . Compression fracture    "several; all in my back" (04/10/2016)  . GERD (gastroesophageal reflux disease)   . Hypertension   . Lymphedema   . Pacemaker 05/2017  . PAF (paroxysmal atrial fibrillation) ([place])    a. On amio & Coumadin - CHA2DS2VASc = 4.  . S/P MVR (mitral valve replacement)    a. MVR 1994 b. redo MVR in 08/2014 - on Coumadin  . Shortness of breath dyspnea     Assessment/Plan:    Active Problems:   Hip fracture requiring operative repair ([place])  Estimated body mass index is 23.95 kg/m as calculated from the following:   Height as of this encounter: 4\' 11"  (1.499 m).   Weight as of this encounter: 53.8 kg. Up with therapy Discharge to SNF when medically cleared  Labs: Hemoglobin 9.3 with an INR of 1.9 DVT Prophylaxis - Coumadin, Foot Pumps and TED hose  Weight-Bearing as tolerated to right leg Will need to follow-up in [place] clinic in 4 weeks. Stitches will need to be removed once the wound is dry  Roselind Klus R. [place] 01/26/2019, 8:11 AM

## 2019-01-26 NOTE — Progress Notes (Addendum)
Pt had been recently repositioned and cream applied to her buttocks roughly an hour prior. NT's did not report any abnormalities at that time.Pt called this nurse into the room for repositioning around 0230.  The honeycomb gauze in the rt hip was saturated with blood. The dressing had been changed on dayshift per report and had not had any discharge up until this point. Pt has been repostioned multiple times this shift with no indication of discharge or oozing at the dressing sites. Upon assessment, there was a moderate amount of oozing of blood from the center of the incision which increased with pressure. Pt Hbg at last draw was 9.0 and INR 1.9. Surgicel applied with gauze and abd pad at this time. MD Jannifer Franklin made aware of situation and interventions. This nurse will continue to monitor the new dressing and reassess in 30 minutes to one hour. Pt has no c/o pain and was not aware that her dressing was wet. Pt educated on the situation and the plan of care explained. Pt stated understanding.

## 2019-01-26 NOTE — Consult Note (Signed)
[place] for Warfarin Dosing  Indication: Mitral Valve Replacement, A. Fib    Allergies  Allergen Reactions  . Ace Inhibitors     Cough  . Doxycycline Nausea Only  . Hydrocodone Itching and Nausea Only  . Mercury     Other reaction(s): Unknown  . Silver Dermatitis    Severe itching  . Cephalexin Rash  . Clindamycin/Lincomycin Rash  . Nickel Rash  . Penicillins Rash    Has patient had a PCN reaction causing immediate rash, facial/tongue/throat swelling, SOB or lightheadedness with hypotension: YES Has patient had a PCN reaction causing severe rash involving mucus membranes or skin necrosis: NO Has patient had a PCN reaction that required hospitalization NO Has patient had a PCN reaction occurring within the last 10 years: NO If all of the above answers are "NO", then may proceed with Cephalosporin use.    Patient Measurements: Height: 4\' 11"  (149.9 cm) Weight: 118 lb 9.6 oz (53.8 kg) IBW/kg (Calculated) : 43.2   Vital Signs: Temp: 98 F (36.7 C) (07/28 0339) Temp Source: Oral (07/28 0339) BP: 99/50 (07/28 0339) Pulse Rate: 67 (07/28 0339)  Labs: Recent Labs    01/24/19 0440 01/25/19 0540 01/25/19 1432 01/26/19 0346  HGB 8.6* 8.4* 9.0* 8.9*  HCT 29.1* 28.8* 31.1* 30.6*  PLT 178 179 186 190  LABPROT 23.7* 21.3*  --  21.7*  INR 2.2* 1.9*  --  1.9*  CREATININE 1.16*  --   --  1.20*    Estimated Creatinine Clearance: 27.5 mL/min (A) (by C-G formula based on SCr of 1.2 mg/dL (H)).   Medical History: Past Medical History:  Diagnosis Date  . Arthritis    "back, hands, knees" (04/10/2016)  . Chronic back pain   . 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 EF 25% by echo in 08/2015 b. RHC in 08/2015 showed normal filling pressures; c. 10/2018 Echo (in setting of atrial tachycardia): EF 25-30%, impaired relaxation. Glob HK. RVSP 50mmHg. Mod dil RA/LA. Mech MV w/ nl fxn (mean grad  58mmHg). Mod TR.   Marland Kitchen Chronic kidney disease   . Compression fracture    "several; all in my back" (04/10/2016)  . GERD (gastroesophageal reflux disease)   . Hypertension   . Lymphedema   . Pacemaker 05/2017  . PAF (paroxysmal atrial fibrillation) ([place])    a. On amio & Coumadin - CHA2DS2VASc = 4.  . S/P MVR (mitral valve replacement)    a. MVR 1994 b. redo MVR in 08/2014 - on Coumadin  . Shortness of breath dyspnea    Assessment: Pharmacy consulted for warfarin dosing and monitoring for 84 yo female with PMH of mitral valve replacement and A. Fib. Patient transferred to [facility] 7/20 PM from [facility]. \  Home dose: warfarin 2.5 mg daily.   DDI: on amio (increase INR)   DATE INR DOSE 7/17  2mg  7/18 1.6 3mg  7/19 2.0 2.5mg  7/20 2.6       2mg  ( According to [facility] records patient did not receive a dose of warfarin 7/20.) 7/21     2.7       2 mg 7/22 2.8 1 mg 7/23 3.0 1 mg - did not receive.  7/24     2.7       2 mg  7/25 2.4 2 mg 7/26 2.2 2.5 mg 7/27     1.9       4 mg 7/27  1.9       3 mg   Goal of Therapy:  INR 2.5-3.5 (ideally want INR between 2.5 to 3 per previous notes) Monitor platelets by anticoagulation protocol: Yes   Plan:  INR level is subtherapeutic  No new mediations started to effect INR. Unsure why INR is trending down.  Plan to give Warfarin 3 mg x 1 tonight. Predict INR to trend up tomorrow. Daily INR ordered. CBC stable. Continue to monitor CBC.   Spoke to MD on 7/22 regarding low Hgb- okay with continuing warfarin at lower dose.  Pharmacy will continue to follow and order warfarin based on INRs  Oswald Hillock, PharmD, BCPS 01/26/2019 7:10 AM

## 2019-01-26 NOTE — Progress Notes (Signed)
Rn changes dressing to bilateral legs and right hip per policy and per wound care order. Pt tolerated the procedure without complaint. MD Hooten notified of right hip dressing. Rn noted blood ooozing from right hip surgical incision. Dr Marry Guan made aware of right hip and of dressing change. Hooten will round on patient later this afternoon.  I will continue to assess.

## 2019-01-26 NOTE — Progress Notes (Signed)
Nutrition Follow Up Note    DOCUMENTATION CODES:   Not applicable  INTERVENTION:   Discontinue Ensure Enlive as pt is refusing   Add Magic cup TID with meals, each supplement provides 290 kcal and 9 grams of protein  Continue Ocuvite daily for wound healing (provides zinc, vitamin A, vitamin C, Vitamin E, copper, and selenium)  Continue vitamin C 597m po daily   Recommend appetite stimulant   NUTRITION DIAGNOSIS:   Increased nutrient needs related to wound healing as evidenced by increased estimated needs.  GOAL:   Patient will meet greater than or equal to 90% of their needs  -not met   MONITOR:   PO intake, Supplement acceptance, Labs, Weight trends, I & O's, Skin  ASSESSMENT:   84y.o. female [facility] secondary to mechanical fall at home, paroxysmal atrial fibrillation, mitral valve replacement on Coumadin, COPD, CKD stage IV requiring CRRT last admit, hypertension, history of left lower extremity hematoma with evacuation and porcine graft performed on 12/25/2018.  Pt continues to have poor appetite and oral intake; pt eating <25% of meals and refusing all supplements. RD will discontinue Ensure and add Magic Cups to meal trays. Recommend continue vitamins to support post op healing. Would consider appetite stimulant to try and increase pt's po intake. Pt is at high refeed risk; recommend monitor electrolytes once oral intake improves. Per chart, pt is weight stable since admit.    Medications reviewed and include: colace, lasix, ocuvite, protonix, vitamin C, warfarin   Labs reviewed: K 3.4(L), BUN 29(H), creat 1.20(H) Hgb 8.9(L), Hct 30.6(L)  Diet Order:   Diet Order            Diet regular Room service appropriate? Yes; Fluid consistency: Thin  Diet effective now             EDUCATION NEEDS:   Education needs have been addressed  Skin:  Skin Assessment: Reviewed RN Assessment(incision R hip,  multiple hematomas and wounds on legs and feet)  Last BM:  7/26  Height:   Ht Readings from Last 1 Encounters:  01/06/2019 4' 11"  (1.499 m)    Weight:   Wt Readings from Last 1 Encounters:  01/25/19 53.8 kg    Ideal Body Weight:  44.5 kg  BMI:  Body mass index is 23.95 kg/m.  Estimated Nutritional Needs:   Kcal:  1400-1600kcal/day  Protein:  70-80g/day  Fluid:  1.1L/day  CKoleen DistanceMS, RD, LDN Pager #- [phone]#- [phone] Hours Pager: [phone]

## 2019-01-26 NOTE — Progress Notes (Addendum)
Reassessment of site shows some oozing still but it has slowed considerably. Pt c/o pain in her leg and "all over", most likely due to positioning. PRN pain meds given and pt repositioned with pillows. There is no noted change in the surrounding area of the incision. Will continue to monitor.

## 2019-01-27 LAB — CBC
HCT: 31.4 % — ABNORMAL LOW (ref 36.0–46.0)
Hemoglobin: 9.1 g/dL — ABNORMAL LOW (ref 12.0–15.0)
MCH: 28.3 pg (ref 26.0–34.0)
MCHC: 29 g/dL — ABNORMAL LOW (ref 30.0–36.0)
MCV: 97.5 fL (ref 80.0–100.0)
Platelets: 186 10*3/uL (ref 150–400)
RBC: 3.22 MIL/uL — ABNORMAL LOW (ref 3.87–5.11)
RDW: 18.9 % — ABNORMAL HIGH (ref 11.5–15.5)
WBC: 7.4 10*3/uL (ref 4.0–10.5)
nRBC: 0 % (ref 0.0–0.2)

## 2019-01-27 LAB — BASIC METABOLIC PANEL
Anion gap: 7 (ref 5–15)
BUN: 25 mg/dL — ABNORMAL HIGH (ref 8–23)
CO2: 38 mmol/L — ABNORMAL HIGH (ref 22–32)
Calcium: 8.8 mg/dL — ABNORMAL LOW (ref 8.9–10.3)
Chloride: 92 mmol/L — ABNORMAL LOW (ref 98–111)
Creatinine, Ser: 1.26 mg/dL — ABNORMAL HIGH (ref 0.44–1.00)
GFR calc Af Amer: 46 mL/min — ABNORMAL LOW (ref >60)
GFR calc non Af Amer: 40 mL/min — ABNORMAL LOW (ref >60)
Glucose, Bld: 85 mg/dL (ref 70–99)
Potassium: 4.2 mmol/L (ref 3.5–5.1)
Sodium: 137 mmol/L (ref 135–145)

## 2019-01-27 LAB — PROTIME-INR
INR: 2.3 — ABNORMAL HIGH (ref 0.8–1.2)
Prothrombin Time: 24.9 seconds — ABNORMAL HIGH (ref 11.4–15.2)

## 2019-01-27 MED ORDER — WARFARIN SODIUM 2.5 MG PO TABS
2.5000 mg | ORAL_TABLET | Freq: Once | ORAL | Status: AC
  Administered 2019-01-27: 2.5 mg via ORAL
  Filled 2019-01-27: qty 1

## 2019-01-27 NOTE — Progress Notes (Signed)
Per son, [name] request, patient right leg rewrapped in continuous kerlix.

## 2019-01-27 NOTE — Progress Notes (Signed)
Physical Therapy Treatment Patient Details Name: [name] MRN: [id number] DOB: Dec 01, 1934 Today's Date: 01/27/2019    History of Present Illness  84 y.o. female with a known history of right hip repair at [facility] on 01/11/2019 secondary to mechanical fall at home, paroxysmal atrial fibrillation, mitral valve replacement on Coumadin, COPD, CKD stage III, hypertension, history of left lower extremity hematoma with evacuation and porcine graft performed on 12/25/2018.    PT Comments    Pt alert today and more willing to try to sit up, but presented with confusion throughout session.  Pt max A to get to EOB and able to sit at bedside once there with VC's to avoid posterior trunk lean.  [name] son present to assist with STS which pt was +2 Max A with increased guarding of R LE.  Pt better able to stand with RW on second attempt and requested to return to bed following 15-20 sec of standing.  She has a tendency to lean posteriorly and states fear of falling is a concern.  Pt also gripped arm of BSC during transfer, causing PT and nurse tech   Pt able to perform supine and seated there ex with min VC's and manual cues.  She perseverated on the dressings applied to her R LE, pulling at them but stopping when discouraged by PT.   Pt will continue to benefit from skilled PT with focus on strength, tolerance to activity, fall prevention and pain management.  Follow Up Recommendations  SNF;Other (comment)     Equipment Recommendations       Recommendations for Other Services       Precautions / Restrictions Precautions Precautions: Fall Precaution Comments: fragile skin Restrictions Weight Bearing Restrictions: Yes RLE Weight Bearing: Weight bearing as tolerated    Mobility  Bed Mobility Overal bed mobility: Needs Assistance Bed Mobility: Sit to Supine;Supine to Sit     Supine to sit: Max assist;HOB elevated Sit to supine: Max assist;HOB elevated;+2 for physical assistance    General bed mobility comments: + for sit to supine with heels supported when bringing pt's LE's over EOB.  Transfers Overall transfer level: Needs assistance Equipment used: Rolling walker (2 wheeled) Transfers: Sit to/from [facility] Sit to Stand: +2 physical assistance;Max assist Stand pivot transfers: +2 physical assistance;Max assist       General transfer comment: Attempted 2x with pt pushing posteriorly more so the first time than the second.  Pt able to lean on RW second attempt and stand for 15-20 sec before requesting to return to the bed.  [name] son assisted with STS.  PT and Nurse tech assisted pt in transfer to [facility] which went well; pt grasped arm of BSC when transferring back to bed, delaying transfer.  Ambulation/Gait                 Stairs             Wheelchair Mobility    Modified Rankin (Stroke Patients Only)       Balance Overall balance assessment: Needs assistance Sitting-balance support: Bilateral upper extremity supported Sitting balance-Leahy Scale: Poor Sitting balance - Comments: Leans posteriorly frequently and requires VC's to "lean forward" and place feet on floor.  She was able to sit up for 2-3 min before requesting to return to bed. Postural control: Posterior lean;Left lateral lean  Cognition Arousal/Alertness: Awake/alert Behavior During Therapy: WFL for tasks assessed/performed Overall Cognitive Status: Within Functional Limits for tasks assessed                                 General Comments: Pt more motivated to try to sit up today than past few days.  Still very fearful of falling and requires frequent encouragement.  Confused throughout session, stating that "somebody was supposed to come at noon" and apologizing that everything is so disorganized.      Exercises General Exercises - Lower Extremity Ankle Circles/Pumps: 20  reps;Strengthening;Supine;Both Quad Sets: Both;10 reps;Strengthening;Supine Long Arc Quad: Strengthening;Both;10 reps;Seated Other Exercises Other Exercises: Toileting x8 min    General Comments General comments (skin integrity, edema, etc.): Bandages on pt's R LE which pt continuously picks at and states "if I could just get some scissors and cut this off".  PT explained to pt that hospital staff would need to care for the dressings and that they should be left alone.      Pertinent Vitals/Pain Pain Assessment: Faces Faces Pain Scale: Hurts a little bit Pain Location: B LE's with touch Pain Descriptors / Indicators: Grimacing;Guarding Pain Intervention(s): Monitored during session;Limited activity within patient's tolerance    Home Living                      Prior Function            PT Goals (current goals can now be found in the care plan section) Progress towards PT goals: Progressing toward goals    Frequency    Min 2X/week      PT Plan Current plan remains appropriate    Co-evaluation              AM-PAC PT "6 Clicks" Mobility   Outcome Measure  Help needed turning from your back to your side while in a flat bed without using bedrails?: A Lot Help needed moving from lying on your back to sitting on the side of a flat bed without using bedrails?: A Lot Help needed moving to and from a bed to a chair (including a wheelchair)?: Total Help needed standing up from a chair using your arms (e.g., wheelchair or bedside chair)?: Total Help needed to walk in hospital room?: Total Help needed climbing 3-5 steps with a railing? : Total 6 Click Score: 8    End of Session Equipment Utilized During Treatment: Gait belt Activity Tolerance: Patient limited by pain;Patient limited by fatigue Patient left: in bed;with nursing/sitter in room;Other (comment);with call bell/phone within reach(protective boots in place)   PT Visit Diagnosis: Muscle weakness  (generalized) (M62.81);Difficulty in walking, not elsewhere classified (R26.2);Pain Pain - Right/Left: Right Pain - part of body: Hip     Time: 6803-2122 PT Time Calculation (min) (ACUTE ONLY): 35 min  Charges:  $Therapeutic Exercise: 8-22 mins $Therapeutic Activity: 8-22 mins                     Roxanne Gates, PT, DPT    Roxanne Gates 01/27/2019, 11:02 AM

## 2019-01-27 NOTE — Progress Notes (Signed)
Dressing changes to bilateral lower extremeties deferred by patient and daughter at this time, they say that the dressings are to be changed only every other day per Dr. Leslye Peer. Verbal order to change dressing change order to every other day given by Dr. Estanislado Pandy.  This RN has reached out to [facility] RN today in regard to the sacral wound dressing changes. Dressing change completed to sacral wound, still awaiting orders for sacral wound dressing changes paging her.

## 2019-01-27 NOTE — Progress Notes (Signed)
Subjective:    Patient reports pain as Unchanged.  Hurts all over which is not new.   Patient is well, and has had no acute complaints or problems Okay for patient to weight-bear today Plan is to go Rehab after hospital stay. no nausea and no vomiting Patient denies any chest pains or shortness of breath. Patient seen and evaluated by Dr. Marry Guan during the evening hours last night.  No active bleeding noted.  Wound VAC was applied to express the hematoma.  Objective: Vital signs in last 24 hours: Temp:  [98.4 F (36.9 C)-98.5 F (36.9 C)] 98.4 F (36.9 C) (07/29 0424) Pulse Rate:  [69-77] 77 (07/29 0424) Resp:  [16] 16 (07/29 0424) BP: (107-116)/(52-57) 116/57 (07/29 0424) SpO2:  [97 %-98 %] 98 % (07/29 0424) Weight:  [54.2 kg] 54.2 kg (07/29 0603) well approximated incision Heels are non tender and elevated off the bed using rolled towels Intake/Output from previous day: 07/28 0701 - 07/29 0700 In: 0  Out: 775 [Urine:775] Intake/Output this shift: No intake/output data recorded.  Recent Labs    01/25/19 0540 01/25/19 1432 01/26/19 0346 01/27/19 0446  HGB 8.4* 9.0* 8.9* 9.1*   Recent Labs    01/26/19 0346 01/27/19 0446  WBC 6.6 7.4  RBC 3.16* 3.22*  HCT 30.6* 31.4*  PLT 190 186   Recent Labs    01/26/19 0346 01/27/19 0446  NA 138 137  K 3.4* 4.2  CL 89* 92*  CO2 39* 38*  BUN 29* 25*  CREATININE 1.20* 1.26*  GLUCOSE 83 85  CALCIUM 8.6* 8.8*   Recent Labs    01/26/19 0346 01/27/19 0446  INR 1.9* 2.3*    EXAM General - Patient is Alert, Appropriate and Oriented Extremity - Neurologically intact Neurovascular intact Sensation intact distally Intact pulses distally Dorsiflexion/Plantar flexion intact No cellulitis present Compartment soft Dressing - dressing C/D/I and Wound VAC is in place Motor Function - intact, moving foot and toes well on exam.    Past Medical History:  Diagnosis Date  . Arthritis    "back, hands, knees"  (04/10/2016)  . Chronic back pain   . 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 EF 25% by echo in 08/2015 b. RHC in 08/2015 showed normal filling pressures; c. 10/2018 Echo (in setting of atrial tachycardia): EF 25-30%, impaired relaxation. Glob HK. RVSP 68mmHg. Mod dil RA/LA. Mech MV w/ nl fxn (mean grad 14mmHg). Mod TR.   Marland Kitchen Chronic kidney disease   . Compression fracture    "several; all in my back" (04/10/2016)  . GERD (gastroesophageal reflux disease)   . Hypertension   . Lymphedema   . Pacemaker 05/2017  . PAF (paroxysmal atrial fibrillation) ([place])    a. On amio & Coumadin - CHA2DS2VASc = 4.  . S/P MVR (mitral valve replacement)    a. MVR 1994 b. redo MVR in 08/2014 - on Coumadin  . Shortness of breath dyspnea     Assessment/Plan:    Active Problems:   Hip fracture requiring operative repair ([place])  Estimated body mass index is 24.12 kg/m as calculated from the following:   Height as of this encounter: 4\' 11"  (1.499 m).   Weight as of this encounter: 54.2 kg. Up with therapy Discharge to SNF when medically cleared  Labs: Hemoglobin 9.1 with a INR of 2.3 DVT Prophylaxis - Coumadin, Foot Pumps and TED hose Weight-Bearing as tolerated to right leg Patient needs a bowel movement Suture will  need to be removed once the proximal incision site has tried Change dressing as needed  Wille Glaser R. [place] 01/27/2019, 7:29 AM

## 2019-01-27 NOTE — Consult Note (Signed)
[place] Nurse wound consult note Reason for Consult: Patient requesting Unna's Boots Patient is followed by Dr. Audelia Hives.  According to Dr. Posey Pronto yesterday, patient was seen by Dr. Marla Roe on Monday, 7/27.  All wounds are healing as expected. No indication for Unna's boots at this time. Wound care is being provided by Bedside RN. If desired, MD may elect to provide a mattress replacement. If so, please order.  [facility] nursing team will not follow, but will remain available to this patient, the nursing and medical teams.  Please re-consult if needed. Thanks, Maudie Flakes, MSN, RN, [place], Arther Abbott  Pager# [phone]

## 2019-01-27 NOTE — Consult Note (Signed)
[place] for Warfarin Dosing  Indication: Mitral Valve Replacement, A. Fib    Allergies  Allergen Reactions  . Ace Inhibitors     Cough  . Doxycycline Nausea Only  . Hydrocodone Itching and Nausea Only  . Mercury     Other reaction(s): Unknown  . Silver Dermatitis    Severe itching  . Cephalexin Rash  . Clindamycin/Lincomycin Rash  . Nickel Rash  . Penicillins Rash    Has patient had a PCN reaction causing immediate rash, facial/tongue/throat swelling, SOB or lightheadedness with hypotension: YES Has patient had a PCN reaction causing severe rash involving mucus membranes or skin necrosis: NO Has patient had a PCN reaction that required hospitalization NO Has patient had a PCN reaction occurring within the last 10 years: NO If all of the above answers are "NO", then may proceed with Cephalosporin use.    Patient Measurements: Height: 4\' 11"  (149.9 cm) Weight: 119 lb 6.4 oz (54.2 kg) IBW/kg (Calculated) : 43.2   Vital Signs: Temp: 97.8 F (36.6 C) (07/29 0816) Temp Source: Oral (07/29 0816) BP: 118/58 (07/29 0816) Pulse Rate: 76 (07/29 0816)  Labs: Recent Labs    01/25/19 0540 01/25/19 1432 01/26/19 0346 01/27/19 0446  HGB 8.4* 9.0* 8.9* 9.1*  HCT 28.8* 31.1* 30.6* 31.4*  PLT 179 186 190 186  LABPROT 21.3*  --  21.7* 24.9*  INR 1.9*  --  1.9* 2.3*  CREATININE  --   --  1.20* 1.26*    Estimated Creatinine Clearance: 26.3 mL/min (A) (by C-G formula based on SCr of 1.26 mg/dL (H)).   Medical History: Past Medical History:  Diagnosis Date  . Arthritis    "back, hands, knees" (04/10/2016)  . Chronic back pain   . 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 EF 25% by echo in 08/2015 b. RHC in 08/2015 showed normal filling pressures; c. 10/2018 Echo (in setting of atrial tachycardia): EF 25-30%, impaired relaxation. Glob HK. RVSP 28mmHg. Mod dil RA/LA. Mech MV w/ nl fxn (mean  grad 58mmHg). Mod TR.   Marland Kitchen Chronic kidney disease   . Compression fracture    "several; all in my back" (04/10/2016)  . GERD (gastroesophageal reflux disease)   . Hypertension   . Lymphedema   . Pacemaker 05/2017  . PAF (paroxysmal atrial fibrillation) ([place])    a. On amio & Coumadin - CHA2DS2VASc = 4.  . S/P MVR (mitral valve replacement)    a. MVR 1994 b. redo MVR in 08/2014 - on Coumadin  . Shortness of breath dyspnea    Assessment: Pharmacy consulted for warfarin dosing and monitoring for 84 yo female with PMH of mitral valve replacement and A. Fib. Patient transferred to [facility] 7/20 PM from [facility].   Home dose: warfarin 2.5 mg daily.   DDI: on amio (increase INR)   Spoke to MD on 7/22 regarding low Hgb- okay with continuing warfarin at lower dose.  DATE INR DOSE 7/17  2mg  7/18 1.6 3mg  7/19 2.0 2.5mg  7/20 2.6       2mg  ( According to [facility] records patient did not receive a dose of warfarin 7/20.) 7/21     2.7       2 mg 7/22 2.8 1 mg 7/23 3.0 1 mg - did not receive.  7/24     2.7       2 mg  7/25 2.4 2 mg 7/26 2.2 2.5 mg 7/27  1.9       4 mg 7/28     1.9       3 mg  7/29 2.3   Goal of Therapy:  INR 2.5-3.5 (ideally want INR between 2.5 to 3 per previous notes) Monitor platelets by anticoagulation protocol: Yes   Plan:  INR level is subtherapeutic but trending up No new mediations started to effect INR. Unsure why INR is trending down.  Plan to give Warfarin 2.5 mg x 1 tonight (reflects average dose over past week and have not seen full effect of previous doses). Predict INR to trend up tomorrow. Daily INR ordered. CBC stable. Continue to monitor CBC.    Pharmacy will continue to follow and order warfarin based on INRs  Rocky Morel, PharmD, BCPS 01/27/2019 9:59 AM

## 2019-01-27 NOTE — Progress Notes (Signed)
Patient ID: [name], female   DOB: 1935/06/08, 84 y.o.   MRN: [id number]   [facility] PROGRESS NOTE  [name] ATF:[id number] DOB: 1935-06-18 DOA: 01/05/2019 PCP: Rusty Aus, MD  HPI/Subjective:  Patient seen and evaluated today Wound VAC was placed yesterday as per orthopedic recommendation Wound care team follow-up today for possible Unna boot placement Tolerating diet well No constipation No fever On oxygen via nasal cannula at 2 L      Objective: Vitals:   01/27/19 0424 01/27/19 0816  BP: (!) 116/57 (!) 118/58  Pulse: 77 76  Resp: 16 19  Temp: 98.4 F (36.9 C) 97.8 F (36.6 C)  SpO2: 98% 98%    Intake/Output Summary (Last 24 hours) at 01/27/2019 1229 Last data filed at 01/27/2019 0430 Gross per 24 hour  Intake -  Output 775 ml  Net -775 ml   Filed Weights   01/24/19 0407 01/25/19 0551 01/27/19 0603  Weight: 55.1 kg 53.8 kg 54.2 kg    ROS: Review of Systems  Constitutional: Negative for chills and fever.  Eyes: Negative for blurred vision.  Respiratory: Negative for cough and shortness of breath.   Cardiovascular: Negative for chest pain.  Gastrointestinal: Negative for abdominal pain, diarrhea, nausea and vomiting.  Genitourinary: Negative for dysuria.  Musculoskeletal: Positive for joint pain.  Neurological: Negative for dizziness and headaches.   Exam: Physical Exam  HENT:  Nose: No mucosal edema.  Mouth/Throat: No oropharyngeal exudate or posterior oropharyngeal edema.  Eyes: Pupils are equal, round, and reactive to light. Conjunctivae, EOM and lids are normal.  Neck: No JVD present. Carotid bruit is not present. No edema present. No thyroid mass and no thyromegaly present.  Cardiovascular: S1 normal and S2 normal. Exam reveals no gallop.  Murmur heard.  Systolic murmur is present with a grade of 3/6. Pulses:      Dorsalis pedis pulses are 2+ on the right side and 2+ on the left side.  Respiratory: No respiratory  distress. She has decreased breath sounds in the right lower field and the left lower field. She has no wheezes. She has no rhonchi. She has no rales.  GI: Soft. Bowel sounds are normal. There is no abdominal tenderness.  Musculoskeletal:     Right ankle: She exhibits swelling.     Left ankle: She exhibits swelling.  Lymphadenopathy:    She has no cervical adenopathy.  Neurological: She is alert. No cranial nerve deficit.  Skin: Skin is warm. Nails show no clubbing.  Psychiatric: She has a normal mood and affect.                      Data Reviewed: Basic Metabolic Panel: Recent Labs  Lab 01/22/19 0604 01/23/19 0516 01/24/19 0440 01/26/19 0346 01/27/19 0446  NA 139 138 136 138 137  K 3.2* 3.8 3.7 3.4* 4.2  CL 87* 88* 89* 89* 92*  CO2 45* 42* 40* 39* 38*  GLUCOSE 95 88 83 83 85  BUN 48* 38* 36* 29* 25*  CREATININE 1.31* 1.25* 1.16* 1.20* 1.26*  CALCIUM 8.9 9.2 8.7* 8.6* 8.8*   CBC: Recent Labs  Lab 01/24/19 0440 01/25/19 0540 01/25/19 1432 01/26/19 0346 01/27/19 0446  WBC 6.7 6.9 7.1 6.6 7.4  NEUTROABS  --   --  5.8  --   --   HGB 8.6* 8.4* 9.0* 8.9* 9.1*  HCT 29.1* 28.8* 31.1* 30.6* 31.4*  MCV 96.4 94.7 98.1 96.8 97.5  PLT 178 179 186  190 186   BNP (last 3 results) Recent Labs    11/04/18 1141 12/04/18 1627 12/04/18 1954  BNP 505.0* 1,189.0* 992.0*     Recent Results (from the past 240 hour(s))  Novel Coronavirus,NAA,(SEND-OUT TO REF LAB - TAT 24-48 hrs); Hosp Order     Status: None   Collection Time: 01/19/19 12:10 AM   Specimen: Nasopharyngeal Swab; Respiratory  Result Value Ref Range Status   SARS-CoV-2, NAA NOT DETECTED NOT DETECTED Final    Comment: (NOTE) This test was developed and its performance characteristics determined by [employer]. This test has not been FDA cleared or approved. This test has been authorized by FDA under an Emergency Use Authorization (EUA). This test is only authorized for the duration of time  the declaration that circumstances exist justifying the authorization of the emergency use of in vitro diagnostic tests for detection of SARS-CoV-2 virus and/or diagnosis of COVID-19 infection under section 564(b)(1) of the Act, 21 U.S.C. 474QVZ-5(G)(3), unless the authorization is terminated or revoked sooner. When diagnostic testing is negative, the possibility of a false negative result should be considered in the context of a patient's recent exposures and the presence of clinical signs and symptoms consistent with COVID-19. An individual without symptoms of COVID-19 and who is not shedding SARS-CoV-2 virus would expect to have a negative (not detected) result in this assay. Performed  At: [facility] [place], [place] [id number] Rush Farmer MD JJ:[id number]    [name]  Final    Comment: Performed at [facility], [address] [place], [place] [address]     Studies: No results found.  Scheduled Meds: . acetaminophen  650 mg Oral Once  . amiodarone  200 mg Oral Daily  . docusate sodium  100 mg Oral BID  . furosemide  20 mg Oral BID  . mouth rinse  15 mL Mouth Rinse BID  . multivitamin-lutein  1 capsule Oral Daily  . pantoprazole  40 mg Oral Daily  . sodium chloride flush  3 mL Intravenous Q12H  . vitamin C  500 mg Oral Daily  . warfarin  2.5 mg Oral ONCE-1800  . Warfarin - Pharmacist Dosing Inpatient   Does not apply q1800   Continuous Infusions:  Assessment/Plan:  1. Acute on chronic combined systolic and diastolic heart failure.  Continue oral Lasix patient stable 2. Paroxysmal atrial fibrillation and history of mitral valve replacement on amiodarone and Coumadin 3. Acute blood loss anemia secondary to large right hip hematoma and skin tear right leg.  Also has other hematomas on the legs.  Hemoglobin stable 4. Hip fracture status post operative repair.  Physical therapy recommending rehab.  SNF placement  once clinically stable.  Follow-up with family. 5. Weakness.  Physical therapy evaluation recommending rehab 6. Chronic kidney disease stage III.  Watch with diuresis 7. Stage II to borderline stage III sacral decubiti.  Will local wound care.  Must change positions 8. Large right hip hematoma.  Needs to be on Coumadin secondary to metallic mitral valve.  Continue to monitor.  Patient has been followed by orthopedics here .  Wound VAC placed. 9. skin tear right lower extremity.  Supportive care.  Daily wound dressings and wound care team follow-up.   Discussed the case with  Dr. Marla Roe (mobile phone [phone]) [phone])   who has reviewed patient's wound and states that they seem to be healing well.  She recommends possible Haematologist      Code Status: DNR  Code Status Orders  (From admission, onward)         Start     Ordered   01/19/19 0004  Do not attempt resuscitation (DNR)  Continuous    Question Answer Comment  In the event of cardiac or respiratory ARREST Do not call a "code blue"   In the event of cardiac or respiratory ARREST Do not perform Intubation, CPR, defibrillation or ACLS   In the event of cardiac or respiratory ARREST Use medication by any route, position, wound care, and other measures to relive pain and suffering. May use oxygen, suction and manual treatment of airway obstruction as needed for comfort.      01/19/19 0003        Code Status History    Date Active Date Inactive Code Status Order ID Comments User Context   01/14/2019 2311 01/19/2019 0003 Full Code [id number]  Mayer Camel, NP Inpatient   01/23/2019 2309 01/06/2019 2311 Full Code [id number]  Mayer Camel, NP Inpatient   12/30/2018 1220 01/06/2019 0608 DNR [id number]  Ottie Glazier, MD Inpatient   12/26/2018 0147 12/30/2018 1220 Full Code [id number]  Lance Coon, MD Inpatient   12/04/2018 1937 12/15/2018 1643 Full Code [id number]  Mayer Camel, NP Inpatient   11/24/2018 1621 11/30/2018 2027 Full Code  [id number]  Fritzi Mandes, MD Inpatient   11/24/2018 1557 11/24/2018 1620 DNR [id number]  Fritzi Mandes, MD Inpatient   11/09/2018 0333 11/24/2018 1557 Full Code [id number]  Mayer Camel, NP ED   11/04/2018 1650 11/08/2018 2006 Full Code [id number]  Saundra Shelling, MD Inpatient   04/10/2016 2034 04/19/2016 1757 Full Code [id number]  Vianne Bulls, MD Inpatient   09/29/2015 1053 10/10/2015 1406 Full Code [id number]  [name], MD Inpatient   04/28/2015 1626 04/30/2015 1422 Full Code [id number]  Demetrios Loll, MD Inpatient   Advance Care Planning Activity    Advance Directive Documentation     Most Recent Value  Type of Advance Directive  Out of facility DNR (pink MOST or yellow form)  Pre-existing out of facility DNR order (yellow form or pink MOST form)  Yellow form placed in chart (order not valid for inpatient use)  "MOST" Form in Place?  -     Family Communication: Daughter on the phone Disposition Plan: To be determined based on clinical course  Time spent: 33 minutes  [place]

## 2019-01-27 NOTE — Consult Note (Signed)
ORTHOPAEDICS: Contacted by nursing about continued bloody drainage from the proximal right hip incision.  Moderate amount of bloody drainage on the dressing. Inspection of the surgical incision demonstrated old blood draining from the midportion of the incision.  Skin edges were otherwise approximated with sutures.  No surrounding erythema.  No purulent drainage.  Impression: Right hip hematoma status post ORIF of a right intertrochanteric femur fracture  Plan: Findings were discussed with the patient.  I recommended placement of a wound VAC to assist with wound healing.  The patient was in agreement.  A 13 cm Prevena wound VAC dressing was applied over the right hip incision.  Wound VAC suction was applied.  No leak was detected.  We will monitor output.  Nursing was informed of the wound VAC placement.  James P. Holley Bouche M.D.

## 2019-01-28 ENCOUNTER — Ambulatory Visit (INDEPENDENT_AMBULATORY_CARE_PROVIDER_SITE_OTHER): Payer: Medicare Other | Admitting: Vascular Surgery

## 2019-01-28 LAB — PROTIME-INR
INR: 2.9 — ABNORMAL HIGH (ref 0.8–1.2)
Prothrombin Time: 29.8 seconds — ABNORMAL HIGH (ref 11.4–15.2)

## 2019-01-28 LAB — HEMOGLOBIN AND HEMATOCRIT, BLOOD
HCT: 28.6 % — ABNORMAL LOW (ref 36.0–46.0)
Hemoglobin: 8.4 g/dL — ABNORMAL LOW (ref 12.0–15.0)

## 2019-01-28 MED ORDER — WARFARIN SODIUM 2 MG PO TABS
2.0000 mg | ORAL_TABLET | Freq: Once | ORAL | Status: AC
  Administered 2019-01-28: 2 mg via ORAL
  Filled 2019-01-28: qty 1

## 2019-01-28 NOTE — Progress Notes (Signed)
PT Cancellation Note  Patient Details Name: [name] MRN: [id number] DOB: 1935/04/12   Cancelled Treatment:    Reason Eval/Treat Not Completed: Fatigue/lethargy limiting ability to participate. Per nursing care being deferred until pt awakens; pt continuing to rest soundly. Re attempt at a later date/time, as pt able and the schedule allows.    Larae Grooms, PTA 01/28/2019, 3:27 PM

## 2019-01-28 NOTE — Progress Notes (Addendum)
Patient ID: [name], female   DOB: 11/27/1934, 84 y.o.   MRN: [id number]   [facility] PROGRESS NOTE  AMBYR QADRI QJJ:[id number] DOB: Nov 11, 1934 DOA: 01/27/2019 PCP: Rusty Aus, MD  HPI/Subjective:  Patient seen and evaluated today Wound VAC was placed yesterday as per orthopedic recommendation On oxygen via nasal cannula at 2 L Wound care team has evaluated the patient yesterday and Unna boot has been deferred Lethargic and sleepy, unable to give much history  Objective: Vitals:   01/28/19 0557 01/28/19 0836  BP: 99/80 (!) 101/57  Pulse: 76 80  Resp: 16 18  Temp: 98 F (36.7 C) 98.3 F (36.8 C)  SpO2: 100% 100%    Intake/Output Summary (Last 24 hours) at 01/28/2019 1236 Last data filed at 01/28/2019 0603 Gross per 24 hour  Intake -  Output 100 ml  Net -100 ml   Filed Weights   01/25/19 0551 01/27/19 0603 01/28/19 0500  Weight: 53.8 kg 54.2 kg 63 kg    ROS: could not be obtaiined as patient is sleepy and lethargic Exam: Physical Exam  HENT:  Nose: No mucosal edema.  Mouth/Throat: No oropharyngeal exudate or posterior oropharyngeal edema.  Eyes: Pupils are equal, round, and reactive to light. Conjunctivae, EOM and lids are normal.  Neck: No JVD present. Carotid bruit is not present. No edema present. No thyroid mass and no thyromegaly present.  Cardiovascular: S1 normal and S2 normal. Exam reveals no gallop.  Murmur heard.  Systolic murmur is present with a grade of 3/6. Pulses:      Dorsalis pedis pulses are 2+ on the right side and 2+ on the left side.  Respiratory: No respiratory distress. She has decreased breath sounds in the right lower field and the left lower field. She has no wheezes. She has no rhonchi. She has no rales.  GI: Soft. Bowel sounds are normal. There is no abdominal tenderness.  Musculoskeletal:     Right ankle: She exhibits swelling.     Left ankle: She exhibits swelling.  Lymphadenopathy:    She has no cervical  adenopathy.  Neurological: She is alert. No cranial nerve deficit.  Skin: Skin is warm. Nails show no clubbing.  Psychiatric: She has a normal mood and affect.                      Data Reviewed: Basic Metabolic Panel: Recent Labs  Lab 01/22/19 0604 01/23/19 0516 01/24/19 0440 01/26/19 0346 01/27/19 0446  NA 139 138 136 138 137  K 3.2* 3.8 3.7 3.4* 4.2  CL 87* 88* 89* 89* 92*  CO2 45* 42* 40* 39* 38*  GLUCOSE 95 88 83 83 85  BUN 48* 38* 36* 29* 25*  CREATININE 1.31* 1.25* 1.16* 1.20* 1.26*  CALCIUM 8.9 9.2 8.7* 8.6* 8.8*   CBC: Recent Labs  Lab 01/24/19 0440 01/25/19 0540 01/25/19 1432 01/26/19 0346 01/27/19 0446 01/28/19 0441  WBC 6.7 6.9 7.1 6.6 7.4  --   NEUTROABS  --   --  5.8  --   --   --   HGB 8.6* 8.4* 9.0* 8.9* 9.1* 8.4*  HCT 29.1* 28.8* 31.1* 30.6* 31.4* 28.6*  MCV 96.4 94.7 98.1 96.8 97.5  --   PLT 178 179 186 190 186  --    BNP (last 3 results) Recent Labs    11/04/18 1141 12/04/18 1627 12/04/18 1954  BNP 505.0* 1,189.0* 992.0*     Recent Results (from the past 240 hour(s))  Novel Coronavirus,NAA,(SEND-OUT TO REF LAB - TAT 24-48 hrs); Hosp Order     Status: None   Collection Time: 01/19/19 12:10 AM   Specimen: Nasopharyngeal Swab; Respiratory  Result Value Ref Range Status   SARS-CoV-2, NAA NOT DETECTED NOT DETECTED Final    Comment: (NOTE) This test was developed and its performance characteristics determined by [employer]. This test has not been FDA cleared or approved. This test has been authorized by FDA under an Emergency Use Authorization (EUA). This test is only authorized for the duration of time the declaration that circumstances exist justifying the authorization of the emergency use of in vitro diagnostic tests for detection of SARS-CoV-2 virus and/or diagnosis of COVID-19 infection under section 564(b)(1) of the Act, 21 U.S.C. 892JJH-4(R)(7), unless the authorization is terminated or revoked sooner.  When diagnostic testing is negative, the possibility of a false negative result should be considered in the context of a patient's recent exposures and the presence of clinical signs and symptoms consistent with COVID-19. An individual without symptoms of COVID-19 and who is not shedding SARS-CoV-2 virus would expect to have a negative (not detected) result in this assay. Performed  At: [facility] [place], [place] [id number] Rush Farmer MD HU:[id number]    [place]  Final    Comment: Performed at [facility], [address] [place], [place] [address]     Studies: No results found.  Scheduled Meds: . acetaminophen  650 mg Oral Once  . amiodarone  200 mg Oral Daily  . docusate sodium  100 mg Oral BID  . furosemide  20 mg Oral BID  . mouth rinse  15 mL Mouth Rinse BID  . multivitamin-lutein  1 capsule Oral Daily  . pantoprazole  40 mg Oral Daily  . sodium chloride flush  3 mL Intravenous Q12H  . vitamin C  500 mg Oral Daily  . warfarin  2 mg Oral ONCE-1800  . Warfarin - Pharmacist Dosing Inpatient   Does not apply q1800   Continuous Infusions:  Assessment/Plan:  1. Acute on chronic combined systolic and diastolic heart failure.  Continue oral Lasix patient stable 2. Paroxysmal atrial fibrillation and history of mitral valve replacement on amiodarone and Coumadin 3. Acute blood loss anemia secondary to large right hip hematoma and skin tear right leg.  Also has other hematomas on the legs.  Hemoglobin stable 4. Hip fracture status post operative repair.  Physical therapy recommending rehab.  Family wants patient to go home once clinically more stable and has enough endurance and strength.  Do not want SNF placement. 5. Weakness.  Status post physical therapy evaluation 6. Chronic kidney disease stage III.  Watch with diuresis 7. Stage II to borderline stage III sacral decubiti.  Will local wound care.  Frequent  changing positions 8. Large right hip hematoma.  Needs to be on Coumadin secondary to metallic mitral valve.  Continue to monitor.  Patient has been followed by orthopedics here .  Wound VAC placed. 9. Skin tear right lower extremity.  Supportive care.  Daily wound dressings and wound care team follow-up.   Discussed the case with  Dr. Marla Roe (mobile phone [phone]) [phone])   who has reviewed patient's wound and states that they seem to be healing well.      Code Status: DNR    Code Status Orders  (From admission, onward)         Start     Ordered   01/19/19 0004  Do not attempt resuscitation (DNR)  Continuous    Question Answer Comment  In the event of cardiac or respiratory ARREST Do not call a "code blue"   In the event of cardiac or respiratory ARREST Do not perform Intubation, CPR, defibrillation or ACLS   In the event of cardiac or respiratory ARREST Use medication by any route, position, wound care, and other measures to relive pain and suffering. May use oxygen, suction and manual treatment of airway obstruction as needed for comfort.      01/19/19 0003        Code Status History    Date Active Date Inactive Code Status Order ID Comments User Context   01/16/2019 2311 01/19/2019 0003 Full Code [id number]  Mayer Camel, NP Inpatient   01/06/2019 2309 01/12/2019 2311 Full Code [id number]  Mayer Camel, NP Inpatient   12/30/2018 1220 01/06/2019 0608 DNR [id number]  Ottie Glazier, MD Inpatient   12/26/2018 0147 12/30/2018 1220 Full Code [id number]  Lance Coon, MD Inpatient   12/04/2018 1937 12/15/2018 1643 Full Code [id number]  Mayer Camel, NP Inpatient   11/24/2018 1621 11/30/2018 2027 Full Code [id number]  Fritzi Mandes, MD Inpatient   11/24/2018 1557 11/24/2018 1620 DNR [id number]  Fritzi Mandes, MD Inpatient   11/09/2018 0333 11/24/2018 1557 Full Code [id number]  Mayer Camel, NP ED   11/04/2018 1650 11/08/2018 2006 Full Code [id number]  Saundra Shelling, MD Inpatient   04/10/2016 2034  04/19/2016 1757 Full Code [id number]  Vianne Bulls, MD Inpatient   09/29/2015 1053 10/10/2015 1406 Full Code [id number]  [name], MD Inpatient   04/28/2015 1626 04/30/2015 1422 Full Code [id number]  Demetrios Loll, MD Inpatient   Advance Care Planning Activity    Advance Directive Documentation     Most Recent Value  Type of Advance Directive  Out of facility DNR (pink MOST or yellow form)  Pre-existing out of facility DNR order (yellow form or pink MOST form)  Yellow form placed in chart (order not valid for inpatient use)  "MOST" Form in Place?  -     Family Communication: Family updated Disposition Plan: To be determined based on clinical course  Time spent: 24 minutes  [place]

## 2019-01-28 NOTE — Care Management Important Message (Signed)
Important Message  Patient Details  Name: [name] MRN: [id number] Date of Birth: [birth year]   Medicare Important Message Given:  Yes     [name] 01/28/2019, 12:59 PM

## 2019-01-28 NOTE — Progress Notes (Signed)
Pt continues to sleep with NAD noted.  Family at bedside.  Orthopedist and Hospitalist have rounded with RN, and agree with plan to defer care until pt awakens.

## 2019-01-28 NOTE — Plan of Care (Signed)

## 2019-01-28 NOTE — Progress Notes (Signed)
Pt awoke and stated that she "does not like this bed; it hurts my back".  Pt fell back to sleep within 10 minutes.

## 2019-01-28 NOTE — Progress Notes (Signed)
Pt resting with eyes closed.  NAD noted.  Family at bedside; discussed plan to NOT disturb patient.  All direct patient care deferred until pt awakens.

## 2019-01-28 NOTE — Consult Note (Signed)
[place] for Warfarin Dosing  Indication: Mitral Valve Replacement, A. Fib    Allergies  Allergen Reactions  . Ace Inhibitors     Cough  . Doxycycline Nausea Only  . Hydrocodone Itching and Nausea Only  . Mercury     Other reaction(s): Unknown  . Silver Dermatitis    Severe itching  . Cephalexin Rash  . Clindamycin/Lincomycin Rash  . Nickel Rash  . Penicillins Rash    Has patient had a PCN reaction causing immediate rash, facial/tongue/throat swelling, SOB or lightheadedness with hypotension: YES Has patient had a PCN reaction causing severe rash involving mucus membranes or skin necrosis: NO Has patient had a PCN reaction that required hospitalization NO Has patient had a PCN reaction occurring within the last 10 years: NO If all of the above answers are "NO", then may proceed with Cephalosporin use.    Patient Measurements: Height: 4\' 11"  (149.9 cm) Weight: 138 lb 14.2 oz (63 kg) IBW/kg (Calculated) : 43.2   Vital Signs: Temp: 98 F (36.7 C) (07/30 0557) Temp Source: Oral (07/30 0557) BP: 99/80 (07/30 0557) Pulse Rate: 76 (07/30 0557)  Labs: Recent Labs    01/25/19 1432 01/26/19 0346 01/27/19 0446 01/28/19 0441  HGB 9.0* 8.9* 9.1* 8.4*  HCT 31.1* 30.6* 31.4* 28.6*  PLT 186 190 186  --   LABPROT  --  21.7* 24.9* 29.8*  INR  --  1.9* 2.3* 2.9*  CREATININE  --  1.20* 1.26*  --     Estimated Creatinine Clearance: 28.2 mL/min (A) (by C-G formula based on SCr of 1.26 mg/dL (H)).   Medical History: Past Medical History:  Diagnosis Date  . Arthritis    "back, hands, knees" (04/10/2016)  . Chronic back pain   . 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 EF 25% by echo in 08/2015 b. RHC in 08/2015 showed normal filling pressures; c. 10/2018 Echo (in setting of atrial tachycardia): EF 25-30%, impaired relaxation. Glob HK. RVSP 38mmHg. Mod dil RA/LA. Mech MV w/ nl fxn (mean grad  31mmHg). Mod TR.   Marland Kitchen Chronic kidney disease   . Compression fracture    "several; all in my back" (04/10/2016)  . GERD (gastroesophageal reflux disease)   . Hypertension   . Lymphedema   . Pacemaker 05/2017  . PAF (paroxysmal atrial fibrillation) ([place])    a. On amio & Coumadin - CHA2DS2VASc = 4.  . S/P MVR (mitral valve replacement)    a. MVR 1994 b. redo MVR in 08/2014 - on Coumadin  . Shortness of breath dyspnea    Assessment: Pharmacy consulted for warfarin dosing and monitoring for 84 yo female with PMH of mitral valve replacement and A. Fib. Patient transferred to [facility] 7/20 PM from [facility].   Home dose: warfarin 2.5 mg daily.   DDI: on amio (increase INR)   Spoke to MD on 7/22 regarding low Hgb- okay with continuing warfarin at lower dose.  DATE INR DOSE 7/17  2mg  7/18 1.6 3mg  7/19 2.0 2.5mg  7/20 2.6       2mg  ( According to [facility] records patient did not receive a dose of warfarin 7/20.) 7/21     2.7       2 mg 7/22 2.8 1 mg 7/23 3.0 1 mg - did not receive.  7/24     2.7       2 mg  7/25 2.4 2 mg 7/26 2.2 2.5 mg  7/27     1.9       4 mg 7/28     1.9       3 mg  7/29 2.3 2.5 mg 7/30     2.9       2 mg  Goal of Therapy:  INR 2.5-3.5 (ideally want INR between 2.5 to 3 per previous notes) Monitor platelets by anticoagulation protocol: Yes   Plan:  INR level is therapeutic  Plan to give Warfarin 2 mg x 1 tonight (reflects average dose over past week and have not seen full effect of previous doses). Predict INR to trend up tomorrow. Daily INR ordered. CBC stable. Continue to monitor CBC.    Pharmacy will continue to follow and order warfarin based on INRs  Oswald Hillock, PharmD, BCPS 01/28/2019 7:46 AM

## 2019-01-28 NOTE — Progress Notes (Signed)
OT Cancellation Note  Patient Details Name: [name] MRN: [id number] DOB: [birth year]   Cancelled Treatment:    Reason Eval/Treat Not Completed: Fatigue/lethargy limiting ability to participate. Pt sleeping, RN requesting therapy hold. Will re-attempt at later date/time as pt is available.   Jeni Salles, MPH, MS, OTR/L ascom [phone] 01/28/19, 4:01 PM

## 2019-01-29 LAB — PROTIME-INR
INR: 3.1 — ABNORMAL HIGH (ref 0.8–1.2)
Prothrombin Time: 31.3 seconds — ABNORMAL HIGH (ref 11.4–15.2)

## 2019-01-29 MED ORDER — WARFARIN SODIUM 2 MG PO TABS
2.0000 mg | ORAL_TABLET | Freq: Once | ORAL | Status: AC
  Administered 2019-01-29: 2 mg via ORAL
  Filled 2019-01-29: qty 1

## 2019-01-29 NOTE — Plan of Care (Signed)

## 2019-01-29 NOTE — Progress Notes (Signed)
Patient ID: [name], female   DOB: 1935/04/28, 84 y.o.   MRN: [id number]   [facility] PROGRESS NOTE  [name]:[id number] DOB: [birth year] DOA: 01/24/2019 PCP: Rusty Aus, MD  HPI/Subjective:  Patient seen and evaluated today Wound VAC was placed yesterday as per orthopedic recommendation Tolerating diet well No constipation No fever On oxygen via nasal cannula at 2 L Wound care team has evaluated the patient yesterday and Unna boot has been deferred More awake and responds to verbal commans Objective: Vitals:   01/29/19 0533 01/29/19 0726  BP: 118/60 105/62  Pulse: 74 80  Resp: 18 19  Temp: 98 F (36.7 C) 97.8 F (36.6 C)  SpO2: 100% 99%    Intake/Output Summary (Last 24 hours) at 01/29/2019 1048 Last data filed at 01/29/2019 1026 Gross per 24 hour  Intake -  Output 775 ml  Net -775 ml   Filed Weights   01/25/19 0551 01/27/19 0603 01/28/19 0500  Weight: 53.8 kg 54.2 kg 63 kg    ROS: Review of Systems  Constitutional: Negative for chills and fever.  Eyes: Negative for blurred vision.  Respiratory: Negative for cough and shortness of breath.   Cardiovascular: Negative for chest pain.  Gastrointestinal: Negative for abdominal pain, diarrhea, nausea and vomiting.  Genitourinary: Negative for dysuria.  Musculoskeletal: Positive for joint pain.  Neurological: Negative for dizziness and headaches.   Exam: Physical Exam  HENT:  Nose: No mucosal edema.  Mouth/Throat: No oropharyngeal exudate or posterior oropharyngeal edema.  Eyes: Pupils are equal, round, and reactive to light. Conjunctivae, EOM and lids are normal.  Neck: No JVD present. Carotid bruit is not present. No edema present. No thyroid mass and no thyromegaly present.  Cardiovascular: S1 normal and S2 normal. Exam reveals no gallop.  Murmur heard.  Systolic murmur is present with a grade of 3/6. Pulses:      Dorsalis pedis pulses are 2+ on the right side and 2+ on the  left side.  Respiratory: No respiratory distress. She has decreased breath sounds in the right lower field and the left lower field. She has no wheezes. She has no rhonchi. She has no rales.  GI: Soft. Bowel sounds are normal. There is no abdominal tenderness.  Musculoskeletal:     Right ankle: She exhibits swelling.     Left ankle: She exhibits swelling.  Lymphadenopathy:    She has no cervical adenopathy.  Neurological: She is alert. No cranial nerve deficit.  Skin: Skin is warm. Nails show no clubbing.  Psychiatric: She has a normal mood and affect.                      Data Reviewed: Basic Metabolic Panel: Recent Labs  Lab 01/23/19 0516 01/24/19 0440 01/26/19 0346 01/27/19 0446  NA 138 136 138 137  K 3.8 3.7 3.4* 4.2  CL 88* 89* 89* 92*  CO2 42* 40* 39* 38*  GLUCOSE 88 83 83 85  BUN 38* 36* 29* 25*  CREATININE 1.25* 1.16* 1.20* 1.26*  CALCIUM 9.2 8.7* 8.6* 8.8*   CBC: Recent Labs  Lab 01/24/19 0440 01/25/19 0540 01/25/19 1432 01/26/19 0346 01/27/19 0446 01/28/19 0441  WBC 6.7 6.9 7.1 6.6 7.4  --   NEUTROABS  --   --  5.8  --   --   --   HGB 8.6* 8.4* 9.0* 8.9* 9.1* 8.4*  HCT 29.1* 28.8* 31.1* 30.6* 31.4* 28.6*  MCV 96.4 94.7 98.1 96.8 97.5  --  PLT 178 179 186 190 186  --    BNP (last 3 results) Recent Labs    11/04/18 1141 12/04/18 1627 12/04/18 1954  BNP 505.0* 1,189.0* 992.0*     No results found for this or any previous visit (from the past 240 hour(s)).   Studies: No results found.  Scheduled Meds: . acetaminophen  650 mg Oral Once  . amiodarone  200 mg Oral Daily  . docusate sodium  100 mg Oral BID  . furosemide  20 mg Oral BID  . mouth rinse  15 mL Mouth Rinse BID  . multivitamin-lutein  1 capsule Oral Daily  . pantoprazole  40 mg Oral Daily  . sodium chloride flush  3 mL Intravenous Q12H  . vitamin C  500 mg Oral Daily  . warfarin  2 mg Oral ONCE-1800  . Warfarin - Pharmacist Dosing Inpatient   Does not apply q1800    Continuous Infusions:  Assessment/Plan:  1. Acute on chronic combined systolic and diastolic heart failure.  Continue oral Lasix patient stable 2. Paroxysmal atrial fibrillation and history of mitral valve replacement on amiodarone and Coumadin 3. Acute blood loss anemia secondary to large right hip hematoma and skin tear right leg.  Also has other hematomas on the legs.  Hemoglobin stable 4. Hip fracture status post operative repair.  Physical therapy recommending rehab.  Family wants patient to go home once clinically more stable and has enough endurance and strength.  Do not want SNF placement. 5. Weakness.  Status post physical therapy evaluation 6. Chronic kidney disease stage III.  Watch with diuresis 7. Stage II to borderline stage III sacral decubiti.  Will local wound care.  Frequent changing positions 8. Large right hip hematoma.  Needs to be on Coumadin secondary to metallic mitral valve.  Continue to monitor.  Patient has been followed by orthopedics here .  Wound VAC placed.F/U with ortho regarding wound vac continuity as drainage has decreased. 9. Skin tear right lower extremity.  Supportive care.  Daily wound dressings and wound care team follow-up.Wound care dressings every alternate day.   Discussed the case with  Dr. Marla Roe (mobile phone [phone]) [phone])   who has reviewed patient's wound and states that they seem to be healing well.    Code Status: DNR    Code Status Orders  (From admission, onward)         Start     Ordered   01/19/19 0004  Do not attempt resuscitation (DNR)  Continuous    Question Answer Comment  In the event of cardiac or respiratory ARREST Do not call a "code blue"   In the event of cardiac or respiratory ARREST Do not perform Intubation, CPR, defibrillation or ACLS   In the event of cardiac or respiratory ARREST Use medication by any route, position, wound care, and other measures to relive pain and suffering. May use oxygen, suction and  manual treatment of airway obstruction as needed for comfort.      01/19/19 0003        Code Status History    Date Active Date Inactive Code Status Order ID Comments User Context   01/19/2019 2311 01/19/2019 0003 Full Code [id number]  Mayer Camel, NP Inpatient   01/12/2019 2309 01/03/2019 2311 Full Code [id number]  Mayer Camel, NP Inpatient   12/30/2018 1220 01/06/2019 0608 DNR [id number]  Ottie Glazier, MD Inpatient   12/26/2018 0147 12/30/2018 1220 Full Code [id number]  Lance Coon, MD Inpatient   12/04/2018  1937 12/15/2018 1643 Full Code [id number]  Mayer Camel, NP Inpatient   11/24/2018 1621 11/30/2018 2027 Full Code [id number]  Fritzi Mandes, MD Inpatient   11/24/2018 1557 11/24/2018 1620 DNR [id number]  Fritzi Mandes, MD Inpatient   11/09/2018 0333 11/24/2018 1557 Full Code [id number]  Mayer Camel, NP ED   11/04/2018 1650 11/08/2018 2006 Full Code [id number]  Saundra Shelling, MD Inpatient   04/10/2016 2034 04/19/2016 1757 Full Code [id number]  Vianne Bulls, MD Inpatient   09/29/2015 1053 10/10/2015 1406 Full Code [id number]  [name], MD Inpatient   04/28/2015 1626 04/30/2015 1422 Full Code [id number]  Demetrios Loll, MD Inpatient   Advance Care Planning Activity    Advance Directive Documentation     Most Recent Value  Type of Advance Directive  Out of facility DNR (pink MOST or yellow form)  Pre-existing out of facility DNR order (yellow form or pink MOST form)  Yellow form placed in chart (order not valid for inpatient use)  "MOST" Form in Place?  -     Family Communication: Family updated Disposition Plan: To be determined based on clinical course  Time spent: 24 minutes  [place]

## 2019-01-29 NOTE — TOC Progression Note (Signed)
Transition of Care [facility]) - Progression Note    Patient Details  Name: [name] MRN: [id number] Date of Birth: February 06, 1935  Transition of [facility]) CM/SW Contact  Ross Ludwig, [place] Phone Number: 01/29/2019, 6:31 PM  Clinical Narrative:     CSW continuing to follow patient's progress throughout discharge planning.  [facility] still does not have any beds available, plan to discharge home with home health once she is medically ready for discharge and orders have been received.   Expected Discharge Plan: (home with home health vs SNF)    Expected Discharge Plan and Services Expected Discharge Plan: (home with home health vs SNF)   Discharge Planning Services: CM Consult   Living arrangements for the past 2 months: [facility] settiings)                 DME Arranged: (Heads up to [facility] with [facility] for hospital bed. Have entered medical necissity for the bed) DME Agency: [facility]     Representative spoke with at DME Agency: [place] Arranged: RN, PT, OT, Nurse's Aide [place] Agency: [place] (Centre) Date [place]: 01/19/19       Social Determinants of Health (SDOH) Interventions    Readmission Risk Interventions Readmission Risk Prevention Plan 12/26/2018 12/07/2018 11/30/2018  Transportation Screening Complete Complete Complete  PCP or Specialist Appt within 3-5 Days - Complete -  HRI or Home Care Consult Complete Complete -  Social Work Consult for [place] Planning/Counseling Complete Not Complete -  SW consult not completed comments - NA -  Palliative Care Screening - Not Applicable -  Medication Review Press photographer) Complete Complete Complete  PCP or Specialist appointment within 3-5 days of discharge - - Complete  HRI or [place] - - Complete  SW Recovery Care/Counseling Consult - - Not Complete  SW Consult Not Complete Comments - - na  Palliative Care Screening - - Not [place] - -  Complete  Some recent data might be hidden

## 2019-01-29 NOTE — Progress Notes (Signed)
Wound care was done for patients at 2315. Pt tolerated well. Will continue to monitor.

## 2019-01-29 NOTE — Progress Notes (Signed)
PT Cancellation Note  Patient Details Name: [name] MRN: [id number] DOB: [birth year]   Cancelled Treatment:    Reason Eval/Treat Not Completed: Patient declined, no reason specified Pt reports that she is too tired to work with PT and that she already did exercises with OT.  PT encouraged her to at least try to supine LE exercises but was adamant that she was too "worn out" to do any more activity today.  Will try at later date as pt is willing and appropriate.  Kreg Shropshire, DPT 01/29/2019, 4:37 PM

## 2019-01-29 NOTE — Progress Notes (Signed)
Occupational Therapy Treatment Patient Details Name: [name] MRN: [id number] DOB: 31-Oct-1934 Today's Date: 01/29/2019    History of present illness  84 y.o. female with a known history of right hip repair at [facility] on 01/11/2019 secondary to mechanical fall at home, paroxysmal atrial fibrillation, mitral valve replacement on Coumadin, COPD, CKD stage III, hypertension, history of left lower extremity hematoma with evacuation and porcine graft performed on 12/25/2018.   OT comments  Pt seen for OT treatment on this date. Upon arrival to room pt awake, seated upright in bed with her daughter present. Pt A&O x 4 reporting 8/10 pain, but agreeable to OT tx. This therapist checked with RN prior to session who stated pt had been given pain medication and could be seen for therapy. Pt and daughter instructed in general UE exercises to support maintenance of UE function. Pt able to return demonstrate 10 repetitions of each exercise with min assist and VC's for technique. Pt continues to demonstrate generalized weakness throughout BUE, however, her grip strength on this date was [facility] approximately 4/5. Per RN and pt caregivers, pt has been requesting total assistance for ADL tasks including feeding and grooming. OT spoke with daughter on this date who is agreeable to continued encouragement for pt to complete as much as she can as independently as possible in order to maintain functional independence and support pt satisfaction during meaningful activities of daily life. At end of session, OT and CNA provided +2 max assist to [facility]. Pt left in room with CNA's to complete peri care as pt declined to attempt on her own and requested assistance from a specific CNA for the task. Pt making good progress toward goals. Pt continues to benefit from skilled OT services to maximize return to PLOF and minimize risk of future falls, injury, caregiver burden, and readmission. Will continue to follow POC. Discharge  recommendation remains appropriate.    Follow Up Recommendations  SNF    Equipment Recommendations  Other (comment)(TBD)    Recommendations for Other Services      Precautions / Restrictions Precautions Precautions: Fall Precaution Comments: fragile skin Restrictions Weight Bearing Restrictions: Yes RLE Weight Bearing: Weight bearing as tolerated       Mobility Bed Mobility Overal bed mobility: Needs Assistance Bed Mobility: Sit to Supine;Supine to Sit     Supine to sit: Max assist;HOB elevated        Transfers Overall transfer level: Needs assistance Equipment used: Rolling walker (2 wheeled) Transfers: Sit to/from [facility] Sit to Stand: +2 physical assistance;Max assist Stand pivot transfers: +2 physical assistance;Max assist       General transfer comment: Pt demonstrated limited short shuffle steps back to [facility] on this date. Pt demonstrated good safety awareness when transferring to [facility]. Reached back for arm rails independently.    Balance Overall balance assessment: Needs assistance Sitting-balance support: Bilateral upper extremity supported Sitting balance-Leahy Scale: Poor Sitting balance - Comments: Leans posteriorly frequently and requires VC's to "lean forward" and place feet on floor when on BSC. Pt stated she was unable. Requested pillow behind back on BSC. Postural control: Posterior lean;Left lateral lean Standing balance support: Bilateral upper extremity supported;During functional activity Standing balance-Leahy Scale: Poor                             ADL either performed or assessed with clinical judgement   ADL  Toilet Transfer: BSC;Stand-pivot;Maximal assistance;+2 for physical assistance Toilet Transfer Details (indicate cue type and reason): Pt required +2 max assist for SPT (with attempts at small shuffled steps) to [facility] on this date. Pt very hesitant to engage in physical  activity. Required encouragment and support from a preffered CNA in order to attempt on this date. Toileting- Clothing Manipulation and Hygiene: Maximal assistance;+2 for physical assistance;Total assistance               Vision Baseline Vision/History: Wears glasses Wears Glasses: At all times Patient Visual Report: No change from baseline     Perception     Praxis      Cognition Arousal/Alertness: Awake/alert Behavior During Therapy: WFL for tasks assessed/performed Overall Cognitive Status: Within Functional Limits for tasks assessed                                 General Comments: Pt requesting to attempt sit on [facility] on this date, but only with assistance of preferred CNA. OT & CNA provided consistent encouragement to engage in activity along with +2 physical assist. Pt continues to decline to engage in self-care tasks on her own.        Exercises General Exercises - Upper Extremity Elbow Flexion: Both;10 reps Elbow Extension: Both;10 reps Wrist Flexion: Both;10 reps Wrist Extension: Both;10 reps Low Level/ICU Exercises Shoulder Flexion: AROM;PROM;Both;10 reps;Other (comment)(Semi-supine in bed.) Elbow Flexion: AROM;PROM;Both;10 reps Other Exercises Other Exercises: Pt assisted with toilet transfer   Shoulder Instructions       General Comments BLE bandages/wound vac in place at start/end of sesion.    Pertinent Vitals/ Pain       Pain Assessment: 0-10 Pain Score: 8  Pain Location: B LE's Pain Descriptors / Indicators: Grimacing;Guarding;Moaning Pain Intervention(s): Limited activity within patient's tolerance;Monitored during session;Premedicated before session  Home Living                                          Prior Functioning/Environment              Frequency  Min 2X/week        Progress Toward Goals  OT Goals(current goals can now be found in the care plan section)  Progress towards OT goals:  Progressing toward goals  Acute Rehab OT Goals Patient Stated Goal: start doing more for myself again OT Goal Formulation: With patient Time For Goal Achievement: 02/02/19 Potential to Achieve Goals: Good  Plan Discharge plan remains appropriate;Frequency remains appropriate    Co-evaluation                 AM-PAC OT "6 Clicks" Daily Activity     Outcome Measure   Help from another person eating meals?: A Lot Help from another person taking care of personal grooming?: A Lot Help from another person toileting, which includes using toliet, bedpan, or urinal?: Total Help from another person bathing (including washing, rinsing, drying)?: Total Help from another person to put on and taking off regular upper body clothing?: A Lot Help from another person to put on and taking off regular lower body clothing?: Total 6 Click Score: 9    End of Session Equipment Utilized During Treatment: Gait belt  OT Visit Diagnosis: Muscle weakness (generalized) (M62.81);History of falling (Z91.81);Pain Pain - Right/Left: Right Pain - part of body: Hip  Activity Tolerance Patient limited by pain;Patient limited by fatigue   Patient Left Other (comment);with nursing/sitter in room(With CNA's in room to complete toileting/peri-care.)   Nurse Communication          Time: 4996-9249 OT Time Calculation (min): 28 min  Charges: OT General Charges $OT Visit: 1 Visit OT Treatments $Self Care/Home Management : 23-37 mins  Shara Blazing, M.S., OTR/L Ascom: [phone] 01/29/19, 4:25 PM

## 2019-01-29 NOTE — Consult Note (Signed)
[place] for Warfarin Dosing  Indication: Mitral Valve Replacement, A. Fib    Allergies  Allergen Reactions  . Ace Inhibitors     Cough  . Doxycycline Nausea Only  . Hydrocodone Itching and Nausea Only  . Mercury     Other reaction(s): Unknown  . Silver Dermatitis    Severe itching  . Cephalexin Rash  . Clindamycin/Lincomycin Rash  . Nickel Rash  . Penicillins Rash    Has patient had a PCN reaction causing immediate rash, facial/tongue/throat swelling, SOB or lightheadedness with hypotension: YES Has patient had a PCN reaction causing severe rash involving mucus membranes or skin necrosis: NO Has patient had a PCN reaction that required hospitalization NO Has patient had a PCN reaction occurring within the last 10 years: NO If all of the above answers are "NO", then may proceed with Cephalosporin use.    Patient Measurements: Height: 4\' 11"  (149.9 cm) Weight: 138 lb 14.2 oz (63 kg) IBW/kg (Calculated) : 43.2   Vital Signs: Temp: 97.8 F (36.6 C) (07/31 0726) Temp Source: Oral (07/31 0726) BP: 105/62 (07/31 0726) Pulse Rate: 80 (07/31 0726)  Labs: Recent Labs    01/27/19 0446 01/28/19 0441 01/29/19 0434  HGB 9.1* 8.4*  --   HCT 31.4* 28.6*  --   PLT 186  --   --   LABPROT 24.9* 29.8* 31.3*  INR 2.3* 2.9* 3.1*  CREATININE 1.26*  --   --     Estimated Creatinine Clearance: 28.2 mL/min (A) (by C-G formula based on SCr of 1.26 mg/dL (H)).   Medical History: Past Medical History:  Diagnosis Date  . Arthritis    "back, hands, knees" (04/10/2016)  . Chronic back pain   . 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 EF 25% by echo in 08/2015 b. RHC in 08/2015 showed normal filling pressures; c. 10/2018 Echo (in setting of atrial tachycardia): EF 25-30%, impaired relaxation. Glob HK. RVSP 8mmHg. Mod dil RA/LA. Mech MV w/ nl fxn (mean grad 26mmHg). Mod TR.   Marland Kitchen Chronic kidney disease    . Compression fracture    "several; all in my back" (04/10/2016)  . GERD (gastroesophageal reflux disease)   . Hypertension   . Lymphedema   . Pacemaker 05/2017  . PAF (paroxysmal atrial fibrillation) ([place])    a. On amio & Coumadin - CHA2DS2VASc = 4.  . S/P MVR (mitral valve replacement)    a. MVR 1994 b. redo MVR in 08/2014 - on Coumadin  . Shortness of breath dyspnea    Assessment: Pharmacy consulted for warfarin dosing and monitoring for 84 yo female with PMH of mitral valve replacement and A. Fib. Patient transferred to [facility] 7/20 PM from [facility].   Home dose: warfarin 2.5 mg daily.   DDI: on amio (increase INR)   Spoke to MD on 7/22 regarding low Hgb- okay with continuing warfarin at lower dose.  DATE INR DOSE 7/23 3.0 1 mg - did not receive.  7/24     2.7       2 mg  7/25 2.4 2 mg 7/26 2.2 2.5 mg 7/27     1.9       4 mg 7/28     1.9       3 mg  7/29 2.3 2.5 mg 7/30     2.9       2 mg 7/31     3.1  2 mg   Goal of Therapy:  INR 2.5-3.5 (ideally want INR between 2.5 to 3 per previous notes) Monitor platelets by anticoagulation protocol: Yes   Plan:  INR level is therapeutic  Plan to give Warfarin 2 mg x 1 tonight Daily INR ordered. CBC stable. Continue to monitor CBC.    Pharmacy will continue to follow and order warfarin based on INRs  Oswald Hillock, PharmD, BCPS 01/29/2019 7:38 AM

## 2019-01-30 LAB — PROTIME-INR
INR: 4 — ABNORMAL HIGH (ref 0.8–1.2)
Prothrombin Time: 38.1 seconds — ABNORMAL HIGH (ref 11.4–15.2)

## 2019-01-30 MED ORDER — COLLAGENASE 250 UNIT/GM EX OINT
TOPICAL_OINTMENT | Freq: Every day | CUTANEOUS | Status: DC
  Administered 2019-01-30 – 2019-02-14 (×12): via TOPICAL
  Filled 2019-01-30 (×2): qty 30

## 2019-01-30 NOTE — Plan of Care (Signed)
  Problem: Education: Goal: Knowledge of General Education information will improve Description: Including pain rating scale, medication(s)/side effects and non-pharmacologic comfort measures 01/30/2019 0207 by Liliane Channel, RN Outcome: Progressing 01/30/2019 0158 by Liliane Channel, RN Outcome: Progressing   Problem: Clinical Measurements: Goal: Will remain free from infection 01/30/2019 0207 by Liliane Channel, RN Outcome: Progressing 01/30/2019 0158 by Liliane Channel, RN Outcome: Progressing   Problem: Pain Managment: Goal: General experience of comfort will improve Outcome: Progressing   Problem: Safety: Goal: Ability to remain free from injury will improve 01/30/2019 0207 by Liliane Channel, RN Outcome: Progressing 01/30/2019 0158 by Liliane Channel, RN Outcome: Progressing

## 2019-01-30 NOTE — Consult Note (Addendum)
ORTHOPAEDICS PROGRESS NOTE  PATIENT NAME: [name] DOB: Nov 15, 1934  MRN: [id number]  POD # 19: ORIF of a right intertrochanteric femur fracture (performed at [facility])  Subjective: The patient is awake.  She complains of generalized fatigue and discomfort.  Objective: Vital signs in last 24 hours: Temp:  [97.8 F (36.6 C)-98.6 F (37 C)] 97.8 F (36.6 C) (08/01 0728) Pulse Rate:  [77-80] 80 (08/01 0728) Resp:  [18-20] 20 (08/01 0325) BP: (101-127)/(56-64) 127/62 (08/01 0728) SpO2:  [94 %-100 %] 94 % (08/01 0728) Weight:  [55.8 kg] 55.8 kg (08/01 0327)  Intake/Output from previous day: 07/31 0701 - 08/01 0700 In: 353 [P.O.:350; I.V.:3] Out: 1000 [Urine:925; Drains:75]  Recent Labs    01/28/19 0441 01/29/19 0434 01/30/19 0438  HGB 8.4*  --   --   HCT 28.6*  --   --   INR 2.9* 3.1* 4.0*    EXAM General: Frail-appearing female seen in no apparent discomfort. Right lower extremity: Wound VAC is in place to the right hip incision.  Just under 100 mL of bloody drainage is present in the reservoir.  Assessment: ORIF of right intertrochanteric femur fracture Right hip hematoma  Plan: The patient has not had physical therapy for the last 2 days (she declined yesterday due to fatigue) Continue attempts to mobilize with therapy. We will continue to follow wound VAC output.  Dyane Broberg P. Holley Bouche M.D.

## 2019-01-30 NOTE — Progress Notes (Signed)
Physical Therapy Treatment Patient Details Name: [name] MRN: [id number] DOB: 02-Apr-1935 Today's Date: 01/30/2019    History of Present Illness  84 y.o. female with a known history of right hip repair at [facility] on 01/11/2019 secondary to mechanical fall at home, paroxysmal atrial fibrillation, mitral valve replacement on Coumadin, COPD, CKD stage III, hypertension, history of left lower extremity hematoma with evacuation and porcine graft performed on 12/25/2018.    PT Comments    Pt in bed.  Resistant to activity but agrees with encouragement from Probation officer and son.  Participated in exercises as described below.  Pt seems generally more comfortable with R LE today with exercises than past treatments.  She remained hesitant but agrees to transfer to chair.  Supine to sit with little attempts to assist from pt and required max a with hand over hand assist for pt to reach for rails.  Once sitting she continues with post lean and needs verbal and tactile cues to remain upright stating she does not want to get up after all.  Encouragement from Probation officer and son explaining she needs to increase mobility for discharge home.  She finally agrees and is able to stand with mod/max a x 2 and transfer to commode.  Pt given time on commode and she transferred back to bed with same assist but sat prematurely on bed despite cues to stand fully seemingly giving up during transfer. Does not make any attempts to scoot hips back on bed with cued and requires max a +2 to reposition while she attempts to lay back down.  Education provided for safety. Returned to supine and positioned for comfort with max a +2.  She refused any further time OOB in straight back chair which is her preference vs recliner.    While pt participated in session with max encouragement, her motivation to participate in exercises and mobility is low with frequent refusals at attempts at therapy this week.  She requires max verbal and  tactile cues to assist with mobility and the effort she put into assisting was minimal.  She requires +2 mod/max assist for mobility, transfers and personal care on commode.  She self-limits time out of bed despite encouragement and education.  Son in and assists with care for family training and does well with activities and encouraging his mom.  He is under the impression she will stay here in the hospital until she is significantly improved with mobility.  This may be challenging given her resistance to activity and self-limiting behaviors being demonstrated.   She does voice some fear of falling during activity.      Follow Up Recommendations  SNF;Other (comment)     Equipment Recommendations       Recommendations for Other Services       Precautions / Restrictions Precautions Precautions: Fall Precaution Comments: fragile skin Restrictions Weight Bearing Restrictions: Yes RLE Weight Bearing: Weight bearing as tolerated    Mobility  Bed Mobility Overal bed mobility: Needs Assistance Bed Mobility: Sit to Supine;Supine to Sit     Supine to sit: Max assist;HOB elevated Sit to supine: Max assist;HOB elevated   General bed mobility comments: Pt puts little effort into reaching for rails and needs hands on assist to grab rails.  Poor active assist by pt.  Transfers Overall transfer level: Needs assistance Equipment used: Rolling walker (2 wheeled) Transfers: Sit to/from Stand Sit to Stand: +2 physical assistance;Max assist            Ambulation/Gait  Ambulation/Gait assistance: Max assist;+2 physical assistance Gait Distance (Feet): 2 Feet Assistive device: Rolling walker (2 wheeled) Gait Pattern/deviations: Step-to pattern;Trunk flexed     General Gait Details: able to step to and from recliner with max a +2 of Probation officer and son.  Pt sits before turning fully and voices general fear with mobility.   Stairs             Wheelchair Mobility    Modified Rankin  (Stroke Patients Only)       Balance Overall balance assessment: Needs assistance Sitting-balance support: Bilateral upper extremity supported Sitting balance-Leahy Scale: Poor Sitting balance - Comments: Leans post with poor attempts to correct using BUE for stability.  Verbal cues and encouragement needed.   Standing balance support: Bilateral upper extremity supported;During functional activity Standing balance-Leahy Scale: Poor Standing balance comment: while she is able to stand well with walker statically but balance decreases with stepping and her motivation to complete task                            Cognition Arousal/Alertness: Awake/alert Behavior During Therapy: WFL for tasks assessed/performed Overall Cognitive Status: Within Functional Limits for tasks assessed                                        Exercises Other Exercises Other Exercises: supine A/AAROM ankle pumps, SLR, heel slides, ab/add x 10 Other Exercises: toileting    General Comments        Pertinent Vitals/Pain Pain Assessment: Faces Faces Pain Scale: Hurts even more Pain Location: B LE's- generally seems more comfortable than last week-end Pain Descriptors / Indicators: Grimacing;Guarding;Moaning Pain Intervention(s): Limited activity within patient's tolerance;Monitored during session;Repositioned    Home Living                      Prior Function            PT Goals (current goals can now be found in the care plan section) Progress towards PT goals: Progressing toward goals    Frequency    Min 2X/week      PT Plan Current plan remains appropriate    Co-evaluation              AM-PAC PT "6 Clicks" Mobility   Outcome Measure  Help needed turning from your back to your side while in a flat bed without using bedrails?: A Lot Help needed moving from lying on your back to sitting on the side of a flat bed without using bedrails?: A Lot Help  needed moving to and from a bed to a chair (including a wheelchair)?: Total Help needed standing up from a chair using your arms (e.g., wheelchair or bedside chair)?: Total Help needed to walk in hospital room?: Total Help needed climbing 3-5 steps with a railing? : Total 6 Click Score: 8    End of Session Equipment Utilized During Treatment: Gait belt Activity Tolerance: Patient tolerated treatment well;Patient limited by fatigue;Other (comment) Patient left: in bed;with call bell/phone within reach;with bed alarm set;with family/visitor present Nurse Communication: Mobility status Pain - Right/Left: Right Pain - part of body: Hip     Time: 3790-2409 PT Time Calculation (min) (ACUTE ONLY): 16 min  Charges:  $Therapeutic Exercise: 8-22 mins  Chesley Noon, PTA 01/30/19, 3:55 PM

## 2019-01-30 NOTE — Consult Note (Signed)
[place] Nurse wound consult note Patient receiving care in [facility]. Reason for Consult: coccyx wound Wound type: unstageable PI Measurement: See flowsheet entry Wound bed: 100% yellow per primary RN entry in flowsheet Drainage (amount, consistency, odor) see flowsheet entry Periwound: see flowsheet entry Dressing procedure/placement/frequency: Apply Santyl to coccyx wound in a nickel thick layer. Cover with a saline moistened gauze, then dry gauze or ABD pad.  Change daily. Although there is not a photo to review, I have reviewed the flowsheet entry by the RN to better understand the coccyx wound.  This consult was completed remotely. Monitor the wound area(s) for worsening of condition such as: Signs/symptoms of infection,  Increase in size,  Development of or worsening of odor, Development of pain, or increased pain at the affected locations.  Notify the medical team if any of these develop.  Thank you for the consult.  Discussed plan of care with the patient and bedside nurse.  [place] nurse will not follow at this time.  Please re-consult the [place] team if needed.  Val Riles, RN, MSN, CWOCN, CNS-BC, pager [phone]

## 2019-01-30 NOTE — Progress Notes (Signed)
Patient ID: [name], female   DOB: [birth year], 84 y.o.   MRN: [id number]   [employer] PROGRESS NOTE  [name] PJA:[id number] DOB: Dec 15, 1934 DOA: 01/24/2019 PCP: Rusty Aus, MD  HPI/Subjective:  Patient seen and evaluated today Wound VAC placed per orthopedic recommendation < 100 ml drainage. Tolerating diet well No constipation No fever On oxygen via nasal cannula at 2 L More awake and responds to verbal commands  Objective: Vitals:   01/30/19 0325 01/30/19 0728  BP: (!) 107/59 127/62  Pulse: 78 80  Resp: 20   Temp: 98 F (36.7 C) 97.8 F (36.6 C)  SpO2: 98% 94%    Intake/Output Summary (Last 24 hours) at 01/30/2019 1552 Last data filed at 01/30/2019 0328 Gross per 24 hour  Intake -  Output 575 ml  Net -575 ml   Filed Weights   01/27/19 0603 01/28/19 0500 01/30/19 0327  Weight: 54.2 kg 63 kg 55.8 kg    ROS: Review of Systems  Constitutional: Negative for chills and fever.  Eyes: Negative for blurred vision.  Respiratory: Negative for cough and shortness of breath.   Cardiovascular: Negative for chest pain.  Gastrointestinal: Negative for abdominal pain, diarrhea, nausea and vomiting.  Genitourinary: Negative for dysuria.  Musculoskeletal: Positive for joint pain.  Neurological: Negative for dizziness and headaches.   Exam: Physical Exam  HENT:  Nose: No mucosal edema.  Mouth/Throat: No oropharyngeal exudate or posterior oropharyngeal edema.  Eyes: Pupils are equal, round, and reactive to light. Conjunctivae, EOM and lids are normal.  Neck: No JVD present. Carotid bruit is not present. No edema present. No thyroid mass and no thyromegaly present.  Cardiovascular: S1 normal and S2 normal. Exam reveals no gallop.  Murmur heard.  Systolic murmur is present with a grade of 3/6. Respiratory: No respiratory distress. She has decreased breath sounds in the right lower field and the left lower field. She has no wheezes. She has no  rhonchi. She has no rales.  GI: Soft. Bowel sounds are normal. There is no abdominal tenderness.  Musculoskeletal:     Right ankle: She exhibits swelling.     Left ankle: She exhibits swelling.  Lymphadenopathy:    She has no cervical adenopathy.  Neurological: She is alert. No cranial nerve deficit.  Skin: Skin is warm. Nails show no clubbing.  Psychiatric: She has a normal mood and affect.                     Data Reviewed: Basic Metabolic Panel: Recent Labs  Lab 01/24/19 0440 01/26/19 0346 01/27/19 0446  NA 136 138 137  K 3.7 3.4* 4.2  CL 89* 89* 92*  CO2 40* 39* 38*  GLUCOSE 83 83 85  BUN 36* 29* 25*  CREATININE 1.16* 1.20* 1.26*  CALCIUM 8.7* 8.6* 8.8*   CBC: Recent Labs  Lab 01/24/19 0440 01/25/19 0540 01/25/19 1432 01/26/19 0346 01/27/19 0446 01/28/19 0441  WBC 6.7 6.9 7.1 6.6 7.4  --   NEUTROABS  --   --  5.8  --   --   --   HGB 8.6* 8.4* 9.0* 8.9* 9.1* 8.4*  HCT 29.1* 28.8* 31.1* 30.6* 31.4* 28.6*  MCV 96.4 94.7 98.1 96.8 97.5  --   PLT 178 179 186 190 186  --    BNP (last 3 results) Recent Labs    11/04/18 1141 12/04/18 1627 12/04/18 1954  BNP 505.0* 1,189.0* 992.0*     No results found for this or  any previous visit (from the past 240 hour(s)).   Studies: No results found.  Scheduled Meds: . acetaminophen  650 mg Oral Once  . amiodarone  200 mg Oral Daily  . collagenase   Topical Daily  . docusate sodium  100 mg Oral BID  . furosemide  20 mg Oral BID  . mouth rinse  15 mL Mouth Rinse BID  . multivitamin-lutein  1 capsule Oral Daily  . pantoprazole  40 mg Oral Daily  . sodium chloride flush  3 mL Intravenous Q12H  . vitamin C  500 mg Oral Daily  . Warfarin - Pharmacist Dosing Inpatient   Does not apply q1800   Continuous Infusions:  Assessment/Plan:  1. Acute on chronic combined systolic and diastolic heart failure.  Continue oral Lasix patient stable 2. Paroxysmal atrial fibrillation and history of mitral valve  replacement on amiodarone and Coumadin- Pharmacy to manage dose for therapeutic INR. 3. Acute blood loss anemia secondary to large right hip hematoma and skin tear right leg.  Also has other hematomas on the legs. Hemoglobin stable. 4. Hip fracture status post operative repair.  Physical therapy recommending rehab.  Family wants patient to go home once clinically more stable and has enough endurance and strength.  Do not want SNF placement. 5. Weakness.  Status post physical therapy evaluation 6. Chronic kidney disease stage III.  Watch with diuresis 7. Stage II to borderline stage III sacral decubiti.   local wound care.  Frequent changing positions 8. Large right hip hematoma.  Needs to be on Coumadin secondary to metallic mitral valve.  Continue to monitor.  Patient has been followed by orthopedics here .  Wound VAC placed.F/U with ortho regarding wound vac continuity as drainage has decreased. 9. Skin tear right lower extremity.  Supportive care.  Daily wound dressings and wound care team follow-up.Wound care dressings every alternate day.   Spoke to pt's daughter on phone.  Code Status: DNR    Code Status Orders  (From admission, onward)         Start     Ordered   01/19/19 0004  Do not attempt resuscitation (DNR)  Continuous    Question Answer Comment  In the event of cardiac or respiratory ARREST Do not call a "code blue"   In the event of cardiac or respiratory ARREST Do not perform Intubation, CPR, defibrillation or ACLS   In the event of cardiac or respiratory ARREST Use medication by any route, position, wound care, and other measures to relive pain and suffering. May use oxygen, suction and manual treatment of airway obstruction as needed for comfort.      01/19/19 0003        Code Status History    Date Active Date Inactive Code Status Order ID Comments User Context   01/03/2019 2311 01/19/2019 0003 Full Code [id number]  Mayer Camel, NP Inpatient   01/14/2019 2309  01/01/2019 2311 Full Code [id number]  Mayer Camel, NP Inpatient   12/30/2018 1220 01/06/2019 0608 DNR [id number]  Ottie Glazier, MD Inpatient   12/26/2018 0147 12/30/2018 1220 Full Code [id number]  Lance Coon, MD Inpatient   12/04/2018 1937 12/15/2018 1643 Full Code [id number]  Mayer Camel, NP Inpatient   11/24/2018 1621 11/30/2018 2027 Full Code [id number]  Fritzi Mandes, MD Inpatient   11/24/2018 1557 11/24/2018 1620 DNR [id number]  Fritzi Mandes, MD Inpatient   11/09/2018 0333 11/24/2018 1557 Full Code [id number]  Mayer Camel, NP ED   11/04/2018 1650 11/08/2018  2006 Full Code [id number]  Saundra Shelling, MD Inpatient   04/10/2016 2034 04/19/2016 1757 Full Code [id number]  Vianne Bulls, MD Inpatient   09/29/2015 1053 10/10/2015 1406 Full Code [id number]  [name], MD Inpatient   04/28/2015 1626 04/30/2015 1422 Full Code [id number]  Demetrios Loll, MD Inpatient   Advance Care Planning Activity    Advance Directive Documentation     Most Recent Value  Type of Advance Directive  Out of facility DNR (pink MOST or yellow form)  Pre-existing out of facility DNR order (yellow form or pink MOST form)  Yellow form placed in chart (order not valid for inpatient use)  "MOST" Form in Place?  -     Family Communication: Family updated Disposition Plan: To be determined based on clinical course  Time spent: 24 minutes  [employer]

## 2019-01-30 NOTE — Consult Note (Signed)
[place] Nurse wound consult note Patient receiving care in [facility].  I have sent Dr. Lasandra Beech a SecureChat message asking for a photo of the coccyx wound.  Val Riles, RN, MSN, CWOCN, CNS-BC, pager [phone]

## 2019-01-30 NOTE — Consult Note (Signed)
[place] for Warfarin Dosing  Indication: Mitral Valve Replacement, A. Fib    Allergies  Allergen Reactions  . Ace Inhibitors     Cough  . Doxycycline Nausea Only  . Hydrocodone Itching and Nausea Only  . Mercury     Other reaction(s): Unknown  . Silver Dermatitis    Severe itching  . Cephalexin Rash  . Clindamycin/Lincomycin Rash  . Nickel Rash  . Penicillins Rash    Has patient had a PCN reaction causing immediate rash, facial/tongue/throat swelling, SOB or lightheadedness with hypotension: YES Has patient had a PCN reaction causing severe rash involving mucus membranes or skin necrosis: NO Has patient had a PCN reaction that required hospitalization NO Has patient had a PCN reaction occurring within the last 10 years: NO If all of the above answers are "NO", then may proceed with Cephalosporin use.    Patient Measurements: Height: 4\' 11"  (149.9 cm) Weight: 123 lb (55.8 kg) IBW/kg (Calculated) : 43.2   Vital Signs: Temp: 97.8 F (36.6 C) (08/01 0728) Temp Source: Oral (08/01 0728) BP: 127/62 (08/01 0728) Pulse Rate: 80 (08/01 0728)  Labs: Recent Labs    01/28/19 0441 01/29/19 0434 01/30/19 0438  HGB 8.4*  --   --   HCT 28.6*  --   --   LABPROT 29.8* 31.3* 38.1*  INR 2.9* 3.1* 4.0*    Estimated Creatinine Clearance: 26.6 mL/min (A) (by C-G formula based on SCr of 1.26 mg/dL (H)).   Medical History: Past Medical History:  Diagnosis Date  . Arthritis    "back, hands, knees" (04/10/2016)  . Chronic back pain   . 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 EF 25% by echo in 08/2015 b. RHC in 08/2015 showed normal filling pressures; c. 10/2018 Echo (in setting of atrial tachycardia): EF 25-30%, impaired relaxation. Glob HK. RVSP 81mmHg. Mod dil RA/LA. Mech MV w/ nl fxn (mean grad 73mmHg). Mod TR.   Marland Kitchen Chronic kidney disease   . Compression fracture    "several; all in my back"  (04/10/2016)  . GERD (gastroesophageal reflux disease)   . Hypertension   . Lymphedema   . Pacemaker 05/2017  . PAF (paroxysmal atrial fibrillation) ([place])    a. On amio & Coumadin - CHA2DS2VASc = 4.  . S/P MVR (mitral valve replacement)    a. MVR 1994 b. redo MVR in 08/2014 - on Coumadin  . Shortness of breath dyspnea    Assessment: Pharmacy consulted for warfarin dosing and monitoring for 84 yo female with PMH of mitral valve replacement and A. Fib. Patient transferred to [facility] 7/20 PM from [facility].   Home dose: warfarin 2.5 mg daily.   DDI: on amio (increase INR)   Spoke to MD on 7/22 regarding low Hgb- okay with continuing warfarin at lower dose.  DATE INR DOSE 7/23 3.0 1 mg - did not receive.  7/24     2.7       2 mg  7/25 2.4 2 mg 7/26 2.2 2.5 mg 7/27     1.9       4 mg 7/28     1.9       3 mg  7/29 2.3 2.5 mg 7/30     2.9       2 mg 7/31     3.1       2 mg  8/1       4.0  Hold   Goal of Therapy:  INR 2.5-3.5 (ideally want INR between 2.5 to 3 per previous notes) Monitor platelets by anticoagulation protocol: Yes   Plan:  INR level is supratherapeutic. Ms Wolfer's INR is jumpy. We have been giving her a lower dose from her home dose. Will hold warfarin until INR < 3.5.  Will hold warfarin dose tonight.  Daily INR ordered. CBC stable. Continue to monitor CBC.    Pharmacy will continue to follow and order warfarin based on INRs  Oswald Hillock, PharmD, BCPS 01/30/2019 8:06 AM

## 2019-01-30 NOTE — Progress Notes (Signed)
Sacral wound covered with yellow exudate.  Consult placed for wound care.  I wonder if it would benefit from santyl application.

## 2019-01-30 DEATH — deceased

## 2019-01-31 LAB — URINALYSIS, COMPLETE (UACMP) WITH MICROSCOPIC
Bilirubin Urine: NEGATIVE
Glucose, UA: NEGATIVE mg/dL
Ketones, ur: NEGATIVE mg/dL
Nitrite: NEGATIVE
Protein, ur: 30 mg/dL — AB
Specific Gravity, Urine: 1.015 (ref 1.005–1.030)
WBC, UA: >50 WBC/hpf — ABNORMAL HIGH (ref 0–5)
pH: 5 (ref 5.0–8.0)

## 2019-01-31 LAB — BASIC METABOLIC PANEL
Anion gap: 8 (ref 5–15)
BUN: 25 mg/dL — ABNORMAL HIGH (ref 8–23)
CO2: 33 mmol/L — ABNORMAL HIGH (ref 22–32)
Calcium: 8.7 mg/dL — ABNORMAL LOW (ref 8.9–10.3)
Chloride: 91 mmol/L — ABNORMAL LOW (ref 98–111)
Creatinine, Ser: 1.36 mg/dL — ABNORMAL HIGH (ref 0.44–1.00)
GFR calc Af Amer: 42 mL/min — ABNORMAL LOW (ref >60)
GFR calc non Af Amer: 36 mL/min — ABNORMAL LOW (ref >60)
Glucose, Bld: 94 mg/dL (ref 70–99)
Potassium: 3.7 mmol/L (ref 3.5–5.1)
Sodium: 132 mmol/L — ABNORMAL LOW (ref 135–145)

## 2019-01-31 LAB — CBC
HCT: 25.5 % — ABNORMAL LOW (ref 36.0–46.0)
Hemoglobin: 7.6 g/dL — ABNORMAL LOW (ref 12.0–15.0)
MCH: 28.8 pg (ref 26.0–34.0)
MCHC: 29.8 g/dL — ABNORMAL LOW (ref 30.0–36.0)
MCV: 96.6 fL (ref 80.0–100.0)
Platelets: 190 10*3/uL (ref 150–400)
RBC: 2.64 MIL/uL — ABNORMAL LOW (ref 3.87–5.11)
RDW: 19.2 % — ABNORMAL HIGH (ref 11.5–15.5)
WBC: 7.8 10*3/uL (ref 4.0–10.5)
nRBC: 0 % (ref 0.0–0.2)

## 2019-01-31 LAB — PROTIME-INR
INR: 3.3 — ABNORMAL HIGH (ref 0.8–1.2)
Prothrombin Time: 33.2 seconds — ABNORMAL HIGH (ref 11.4–15.2)

## 2019-01-31 LAB — HEMOGLOBIN: Hemoglobin: 8 g/dL — ABNORMAL LOW (ref 12.0–15.0)

## 2019-01-31 MED ORDER — WARFARIN SODIUM 1 MG PO TABS
1.0000 mg | ORAL_TABLET | Freq: Once | ORAL | Status: AC
  Administered 2019-01-31: 1 mg via ORAL
  Filled 2019-01-31: qty 1

## 2019-01-31 NOTE — Progress Notes (Signed)
Patient ID: [name], female   DOB: 11/03/1934, 84 y.o.   MRN: [id number]   [employer] PROGRESS NOTE  XIANNA SIVERLING MEQ:[id number] DOB: 02-01-1935 DOA: 01/20/2019 PCP: Rusty Aus, MD  HPI/Subjective:  Patient seen and evaluated today Wound VAC placed per orthopedic recommendation < 100 ml drainage. Tolerating diet well No constipation No fever On oxygen via nasal cannula at 2 L More awake and responds to verbal commands  Objective: Vitals:   01/31/19 0511 01/31/19 0747  BP: 104/75 99/66  Pulse:  (!) 115  Resp:  18  Temp:  98.1 F (36.7 C)  SpO2:  94%    Intake/Output Summary (Last 24 hours) at 01/31/2019 1606 Last data filed at 01/31/2019 1557 Gross per 24 hour  Intake 480 ml  Output 775 ml  Net -295 ml   Filed Weights   01/28/19 0500 01/30/19 0327 01/31/19 0900  Weight: 63 kg 55.8 kg 51.3 kg    ROS: Review of Systems  Constitutional: Negative for chills and fever.  Eyes: Negative for blurred vision.  Respiratory: Negative for cough and shortness of breath.   Cardiovascular: Negative for chest pain.  Gastrointestinal: Negative for abdominal pain, diarrhea, nausea and vomiting.  Genitourinary: Negative for dysuria.  Musculoskeletal: Positive for joint pain.  Neurological: Negative for dizziness and headaches.   Exam: Physical Exam  HENT:  Nose: No mucosal edema.  Mouth/Throat: No oropharyngeal exudate or posterior oropharyngeal edema.  Eyes: Pupils are equal, round, and reactive to light. Conjunctivae, EOM and lids are normal.  Neck: No JVD present. Carotid bruit is not present. No edema present. No thyroid mass and no thyromegaly present.  Cardiovascular: S1 normal and S2 normal. Exam reveals no gallop.  Murmur heard.  Systolic murmur is present with a grade of 3/6. Respiratory: No respiratory distress. She has decreased breath sounds in the right lower field and the left lower field. She has no wheezes. She has no rhonchi. She has no  rales.  GI: Soft. Bowel sounds are normal. There is no abdominal tenderness.  Musculoskeletal:     Right ankle: She exhibits swelling.     Left ankle: She exhibits swelling.  Lymphadenopathy:    She has no cervical adenopathy.  Neurological: She is alert. No cranial nerve deficit.  Generalized weakness, moves both upper limbs - power 3/5, both lower limbs 2-3/5  Skin: Skin is warm. Nails show no clubbing.  Psychiatric: She has a normal mood and affect.               Data Reviewed: Basic Metabolic Panel: Recent Labs  Lab 01/26/19 0346 01/27/19 0446 01/31/19 0324  NA 138 137 132*  K 3.4* 4.2 3.7  CL 89* 92* 91*  CO2 39* 38* 33*  GLUCOSE 83 85 94  BUN 29* 25* 25*  CREATININE 1.20* 1.26* 1.36*  CALCIUM 8.6* 8.8* 8.7*   CBC: Recent Labs  Lab 01/25/19 0540 01/25/19 1432 01/26/19 0346 01/27/19 0446 01/28/19 0441 01/31/19 0324 01/31/19 0849  WBC 6.9 7.1 6.6 7.4  --  7.8  --   NEUTROABS  --  5.8  --   --   --   --   --   HGB 8.4* 9.0* 8.9* 9.1* 8.4* 7.6* 8.0*  HCT 28.8* 31.1* 30.6* 31.4* 28.6* 25.5*  --   MCV 94.7 98.1 96.8 97.5  --  96.6  --   PLT 179 186 190 186  --  190  --    BNP (last 3 results) Recent Labs  11/04/18 1141 12/04/18 1627 12/04/18 1954  BNP 505.0* 1,189.0* 992.0*     No results found for this or any previous visit (from the past 240 hour(s)).   Studies: No results found.  Scheduled Meds: . acetaminophen  650 mg Oral Once  . amiodarone  200 mg Oral Daily  . collagenase   Topical Daily  . docusate sodium  100 mg Oral BID  . furosemide  20 mg Oral BID  . mouth rinse  15 mL Mouth Rinse BID  . multivitamin-lutein  1 capsule Oral Daily  . pantoprazole  40 mg Oral Daily  . sodium chloride flush  3 mL Intravenous Q12H  . vitamin C  500 mg Oral Daily  . warfarin  1 mg Oral ONCE-1800  . Warfarin - Pharmacist Dosing Inpatient   Does not apply q1800   Continuous Infusions:  Assessment/Plan:  1. Acute on chronic combined  systolic and diastolic heart failure.  Continue oral Lasix patient stable 2. Paroxysmal atrial fibrillation and history of mitral valve replacement on amiodarone and Coumadin- Pharmacy to manage dose for therapeutic INR. 3. Acute blood loss anemia secondary to large right hip hematoma and skin tear right leg.  Also has other hematomas on the legs. Hemoglobin stable. 4. Hip fracture status post operative repair at [facility].  Physical therapy recommending rehab.  Family wants patient to go home once clinically more stable and has enough endurance and strength.  Do not want SNF placement. 5. Weakness. Status post physical therapy evaluation-suggested rehab placement but family does not want her to go to rehab and would like to take her home once she is able to transfer from bed to commode or wheelchair.  As patient had long hospital stays and has significant weakness, she is still requiring 2+ assist stand up and it does not seem possible in the near future that she might be able to have that much today and to move without significant support. 6. Chronic kidney disease stage III.  Watch with diuresis, stable. 7. sacral decubiti.  local wound care.  Frequent changing positions 8. Large right hip hematoma.  Needs to be on Coumadin secondary to metallic mitral valve.  Continue to monitor.  Patient has been followed by orthopedics here .  Wound VAC placed.F/U with ortho regarding wound vac continuity as drainage has decreased. 9. Skin tear right lower extremity.  Supportive care.  Daily wound dressings and wound care team follow-up.Wound care dressings every alternate day. 10.  Altered mental status-as per patient's family today she has worsening altered mental status than her baseline.  They had concern about UTi. 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  This could be due to presence of Foley catheter.  Does not seem to have UTI.  I have added a urine culture to check  for presence of bacteria causing UTI.  Spoke to son and informed and explained this to him.   Code Status: DNR    Code Status Orders  (From admission, onward)         Start     Ordered   01/19/19 0004  Do not attempt resuscitation (DNR)  Continuous    Question Answer Comment  In the event of cardiac or respiratory ARREST Do not call a "code blue"   In the event of cardiac or respiratory ARREST Do not perform Intubation, CPR, defibrillation or ACLS   In the event of cardiac or respiratory ARREST Use medication by any route,  position, wound care, and other measures to relive pain and suffering. May use oxygen, suction and manual treatment of airway obstruction as needed for comfort.      01/19/19 0003        Code Status History    Date Active Date Inactive Code Status Order ID Comments User Context   01/07/2019 2311 01/19/2019 0003 Full Code [id number]  Mayer Camel, NP Inpatient   01/21/2019 2309 01/17/2019 2311 Full Code [id number]  Mayer Camel, NP Inpatient   12/30/2018 1220 01/06/2019 0608 DNR [id number]  Ottie Glazier, MD Inpatient   12/26/2018 0147 12/30/2018 1220 Full Code [id number]  Lance Coon, MD Inpatient   12/04/2018 1937 12/15/2018 1643 Full Code [id number]  Mayer Camel, NP Inpatient   11/24/2018 1621 11/30/2018 2027 Full Code [id number]  Fritzi Mandes, MD Inpatient   11/24/2018 1557 11/24/2018 1620 DNR [id number]  Fritzi Mandes, MD Inpatient   11/09/2018 0333 11/24/2018 1557 Full Code [id number]  Mayer Camel, NP ED   11/04/2018 1650 11/08/2018 2006 Full Code [id number]  Saundra Shelling, MD Inpatient   04/10/2016 2034 04/19/2016 1757 Full Code [id number]  Vianne Bulls, MD Inpatient   09/29/2015 1053 10/10/2015 1406 Full Code [id number]  [name], MD Inpatient   04/28/2015 1626 04/30/2015 1422 Full Code [id number]  Demetrios Loll, MD Inpatient   Advance Care Planning Activity    Advance Directive Documentation     Most Recent Value  Type of Advance Directive  Out of facility  DNR (pink MOST or yellow form)  Pre-existing out of facility DNR order (yellow form or pink MOST form)  Yellow form placed in chart (order not valid for inpatient use)  "MOST" Form in Place?  -     Family Communication: Family updated Disposition Plan: To be determined based on clinical course  Time spent: 34 minutes  [employer]

## 2019-01-31 NOTE — Progress Notes (Signed)
Nutrition Follow-up  DOCUMENTATION CODES:   Not applicable  INTERVENTION:  Continue Magic cup TID with meals, each supplement provides 290 kcal and 9 grams of protein.  Continue Ocuvite daily for wound healing (provides zinc, vitamin A, vitamin C, Vitamin E, copper, and selenium).  Continue vitamin C 500 mg po daily.  Recommend appetite stimulant.  NUTRITION DIAGNOSIS:   Increased nutrient needs related to wound healing as evidenced by estimated needs.  Ongoing.  GOAL:   Patient will meet greater than or equal to 90% of their needs  Progressing.  MONITOR:   PO intake, Supplement acceptance, Labs, Weight trends, I & O's, Skin  REASON FOR ASSESSMENT:   Malnutrition Screening Tool, Consult Assessment of nutrition requirement/status, Hip fracture protocol, Wound healing  ASSESSMENT:   84 y.o. female with a known history of right hip repair at [facility] on 01/11/2019 secondary to mechanical fall at home, paroxysmal atrial fibrillation, mitral valve replacement on Coumadin, COPD, CKD stage IV requiring CRRT last admit, hypertension, history of left lower extremity hematoma with evacuation and porcine graft performed on 12/25/2018.  Met with patient and caregiver at bedside. Patient reports her appetite is starting to improve now. However, according to caregiver her intake has not yet improved. She ate 0% of dinner last night. This morning for breakfast she had 35%. She did not like Ensure so that was discontinued last week. According to caregiver patient is doing better with the [employer]. Encouraged patient to eat adequate calories and protein from meals and oral nutrition supplements.  Medications reviewed and include: amiodarone, Colace 100 mg BID, Lasix 20 mg BID, Ocuvite daily, pantoprazole, vitamin C 500 mg daily.  Labs reviewed: Sodium 132, Chloride 91, CO2 33, BUN 25, Creatinine 1.36.  Diet Order:   Diet Order            Diet regular Room service appropriate? Yes;  Fluid consistency: Thin  Diet effective now             EDUCATION NEEDS:   Education needs have been addressed  Skin:  Skin Assessment: Reviewed RN Assessment(incision R hip, multiple hematomas and wounds on legs and feet)  Last BM:  01/31/2019 - medium type 6  Height:   Ht Readings from Last 1 Encounters:  01/29/2019 4' 11"  (1.499 m)   Weight:   Wt Readings from Last 1 Encounters:  01/31/19 51.3 kg   Ideal Body Weight:  44.5 kg  BMI:  Body mass index is 22.82 kg/m.  Estimated Nutritional Needs:   Kcal:  1400-1600kcal/day  Protein:  70-80g/day  Fluid:  1.1L/day  Willey Blade, MS, RD, LDN Office: [phone] Pager: [phone] After Hours/Weekend Pager: [phone]

## 2019-01-31 NOTE — Plan of Care (Signed)
  Problem: Education: Goal: Knowledge of General Education information will improve Description: Including pain rating scale, medication(s)/side effects and non-pharmacologic comfort measures Outcome: Progressing   Problem: Pain Managment: Goal: General experience of comfort will improve Outcome: Progressing   Problem: Safety: Goal: Ability to remain free from injury will improve Outcome: Progressing   

## 2019-01-31 NOTE — Progress Notes (Signed)
Bilateral lower extremity dressing changes and coccyx wound dressing changed this afternoon, patient tolerated well.

## 2019-01-31 NOTE — Progress Notes (Addendum)
Pt refused to get weigh this morning and states to ask her again during day time. Pt was educated on the important of getting her weigh but still refused at this time. Will notify incoming shift. Will continue to monitor.   Update 0602: pt looked uncomfortable in the bed. Staff went to offer to fixed her in the bed, but pt refused. Will notify incoming shift. Will continue to monitor.

## 2019-01-31 NOTE — Consult Note (Addendum)
[place] for Warfarin Dosing  Indication: Mitral Valve Replacement, A. Fib    Allergies  Allergen Reactions  . Ace Inhibitors     Cough  . Doxycycline Nausea Only  . Hydrocodone Itching and Nausea Only  . Mercury     Other reaction(s): Unknown  . Silver Dermatitis    Severe itching  . Cephalexin Rash  . Clindamycin/Lincomycin Rash  . Nickel Rash  . Penicillins Rash    Has patient had a PCN reaction causing immediate rash, facial/tongue/throat swelling, SOB or lightheadedness with hypotension: YES Has patient had a PCN reaction causing severe rash involving mucus membranes or skin necrosis: NO Has patient had a PCN reaction that required hospitalization NO Has patient had a PCN reaction occurring within the last 10 years: NO If all of the above answers are "NO", then may proceed with Cephalosporin use.    Patient Measurements: Height: 4\' 11"  (149.9 cm) Weight: 123 lb (55.8 kg) IBW/kg (Calculated) : 43.2   Vital Signs: Temp: 98.1 F (36.7 C) (08/02 0747) Temp Source: Oral (08/02 0747) BP: 99/66 (08/02 0747) Pulse Rate: 115 (08/02 0747)  Labs: Recent Labs    01/29/19 0434 01/30/19 0438 01/31/19 0324  HGB  --   --  7.6*  HCT  --   --  25.5*  PLT  --   --  190  LABPROT 31.3* 38.1* 33.2*  INR 3.1* 4.0* 3.3*  CREATININE  --   --  1.36*    Estimated Creatinine Clearance: 24.7 mL/min (A) (by C-G formula based on SCr of 1.36 mg/dL (H)).   Medical History: Past Medical History:  Diagnosis Date  . Arthritis    "back, hands, knees" (04/10/2016)  . Chronic back pain   . 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 EF 25% by echo in 08/2015 b. RHC in 08/2015 showed normal filling pressures; c. 10/2018 Echo (in setting of atrial tachycardia): EF 25-30%, impaired relaxation. Glob HK. RVSP 85mmHg. Mod dil RA/LA. Mech MV w/ nl fxn (mean grad 31mmHg). Mod TR.   Marland Kitchen Chronic kidney disease   .  Compression fracture    "several; all in my back" (04/10/2016)  . GERD (gastroesophageal reflux disease)   . Hypertension   . Lymphedema   . Pacemaker 05/2017  . PAF (paroxysmal atrial fibrillation) ([place])    a. On amio & Coumadin - CHA2DS2VASc = 4.  . S/P MVR (mitral valve replacement)    a. MVR 1994 b. redo MVR in 08/2014 - on Coumadin  . Shortness of breath dyspnea    Assessment: Pharmacy consulted for warfarin dosing and monitoring for 84 yo female with PMH of mitral valve replacement and A. Fib. Patient transferred to [facility] 7/20 PM from [facility].   Home dose: warfarin 2.5 mg daily.   DDI: on amio (increase INR)   Spoke to MD on 7/22 regarding low Hgb- okay with continuing warfarin at lower dose.  DATE INR DOSE 7/23 3.0 1 mg - did not receive.  7/24     2.7       2 mg  7/25 2.4 2 mg 7/26 2.2 2.5 mg 7/27     1.9       4 mg 7/28     1.9       3 mg  7/29 2.3 2.5 mg 7/30     2.9       2 mg 7/31     3.1  2 mg  8/1       4.0       Hold  8/2       3.3       1 mg  Goal of Therapy:  INR 2.5-3.5 (ideally want INR between 2.5 to 3 per previous notes) Monitor platelets by anticoagulation protocol: Yes   Plan:  INR level is supratherapeutic. Ms Basher's INR is jumpy. We have been giving her a lower dose from her home dose. Will give warfarin 1 mg tonight x 1. Patient may need to be discharged on 2 mg daily.  Daily INR ordered. Hgb trending down. Continue to monitor CBC.    Pharmacy will continue to follow and order warfarin based on INRs  Oswald Hillock, PharmD, BCPS 01/31/2019 8:28 AM

## 2019-02-01 ENCOUNTER — Encounter: Payer: Self-pay | Admitting: *Deleted

## 2019-02-01 LAB — PROTIME-INR
INR: 2.7 — ABNORMAL HIGH (ref 0.8–1.2)
Prothrombin Time: 27.9 seconds — ABNORMAL HIGH (ref 11.4–15.2)

## 2019-02-01 MED ORDER — WARFARIN SODIUM 1 MG PO TABS
1.0000 mg | ORAL_TABLET | Freq: Once | ORAL | Status: AC
  Administered 2019-02-01: 1 mg via ORAL
  Filled 2019-02-01: qty 1

## 2019-02-01 NOTE — Consult Note (Signed)
[place] for Warfarin Dosing  Indication: Mitral Valve Replacement, A. Fib    Allergies  Allergen Reactions  . Ace Inhibitors     Cough  . Doxycycline Nausea Only  . Hydrocodone Itching and Nausea Only  . Mercury     Other reaction(s): Unknown  . Silver Dermatitis    Severe itching  . Cephalexin Rash  . Clindamycin/Lincomycin Rash  . Nickel Rash  . Penicillins Rash    Has patient had a PCN reaction causing immediate rash, facial/tongue/throat swelling, SOB or lightheadedness with hypotension: YES Has patient had a PCN reaction causing severe rash involving mucus membranes or skin necrosis: NO Has patient had a PCN reaction that required hospitalization NO Has patient had a PCN reaction occurring within the last 10 years: NO If all of the above answers are "NO", then may proceed with Cephalosporin use.    Patient Measurements: Height: 4\' 11"  (149.9 cm) Weight: 105 lb 9.6 oz (47.9 kg) IBW/kg (Calculated) : 43.2   Vital Signs: Temp: 98 F (36.7 C) (08/03 0518) Temp Source: Oral (08/03 0518) BP: 110/72 (08/03 0518) Pulse Rate: 110 (08/03 0518)  Labs: Recent Labs    01/30/19 0438 01/31/19 0324 01/31/19 0849 02/01/19 0410  HGB  --  7.6* 8.0*  --   HCT  --  25.5*  --   --   PLT  --  190  --   --   LABPROT 38.1* 33.2*  --  27.9*  INR 4.0* 3.3*  --  2.7*  CREATININE  --  1.36*  --   --     Estimated Creatinine Clearance: 22.1 mL/min (A) (by C-G formula based on SCr of 1.36 mg/dL (H)).   Medical History: Past Medical History:  Diagnosis Date  . Arthritis    "back, hands, knees" (04/10/2016)  . Chronic back pain   . 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 EF 25% by echo in 08/2015 b. RHC in 08/2015 showed normal filling pressures; c. 10/2018 Echo (in setting of atrial tachycardia): EF 25-30%, impaired relaxation. Glob HK. RVSP 67mmHg. Mod dil RA/LA. Mech MV w/ nl fxn (mean grad  59mmHg). Mod TR.   Marland Kitchen Chronic kidney disease   . Compression fracture    "several; all in my back" (04/10/2016)  . GERD (gastroesophageal reflux disease)   . Hypertension   . Lymphedema   . Pacemaker 05/2017  . PAF (paroxysmal atrial fibrillation) ([place])    a. On amio & Coumadin - CHA2DS2VASc = 4.  . S/P MVR (mitral valve replacement)    a. MVR 1994 b. redo MVR in 08/2014 - on Coumadin  . Shortness of breath dyspnea    Assessment: Pharmacy consulted for warfarin dosing and monitoring for 84 yo female with PMH of mitral valve replacement and A. Fib. Patient transferred to [facility] 7/20 PM from [place]. Patient's INR is sensitive to changes. We have been giving her a lower dose from her home dose. Patient may need to be discharged on 2 mg daily.   Home dose: warfarin 2.5 mg daily.   DDI: on amio (increase INR)   Spoke to MD on 7/22 regarding low Hgb- okay with continuing warfarin at lower dose.  Today's INR is therapeutic, s/p held dose and dose reduction.   DATE INR DOSE 7/23 3.0 1 mg - did not receive.  7/24     2.7       2 mg  7/25 2.4  2 mg 7/26 2.2 2.5 mg 7/27     1.9       4 mg 7/28     1.9       3 mg  7/29 2.3 2.5 mg 7/30     2.9       2 mg 7/31     3.1       2 mg  8/1       4.0       Hold  8/2       3.3       1 mg 8/3 2.7   Goal of Therapy:  INR 2.5-3.5 (ideally want INR between 2.5 to 3 per previous notes) Monitor platelets by anticoagulation protocol: Yes   Plan:  INR level is therapeutic. Will give warfarin 1 mg tonight x 1 dose.  Daily INR ordered. Hgb trending down. Continue to monitor CBC.   Pharmacy will continue to follow and order warfarin based on INRs  Rowland Lathe, PharmD 02/01/2019 7:41 AM

## 2019-02-01 NOTE — Progress Notes (Signed)
Physical Therapy Treatment Patient Details Name: [name] MRN: [id number] DOB: 1935-02-17 Today's Date: 02/01/2019    History of Present Illness  84 y.o. female with a known history of right hip repair at [facility] on 01/11/2019 secondary to mechanical fall at home, paroxysmal atrial fibrillation, mitral valve replacement on Coumadin, COPD, CKD stage III, hypertension, history of left lower extremity hematoma with evacuation and porcine graft performed on 12/25/2018.    PT Comments    Pt presents with deficits in strength, transfers, mobility, gait, balance, and activity tolerance.  Pt required max A with sup to sit and once in sitting presented with a posterior and L lateral lean, leaning away from right hip pain.  Pt required +2 max A and extensive verbal and tactile cues for sequencing with sit to/from stand transfers along with physical assistance placing her left foot in the proper position pre-stand.  Pt requested transfer to the [facility] for a BM after several sit to/from stands and required +2 max A to go from bed to [facility] with a heavy posterior lean.  Pt will benefit from PT services in a SNF setting upon discharge to safely address above deficits for decreased caregiver assistance and eventual return to PLOF.      Follow Up Recommendations  SNF     Equipment Recommendations  Other (comment)(TBD at next venue of care)    Recommendations for Other Services       Precautions / Restrictions Precautions Precautions: Fall Precaution Comments: fragile skin Restrictions Weight Bearing Restrictions: Yes RLE Weight Bearing: Weight bearing as tolerated    Mobility  Bed Mobility Overal bed mobility: Needs Assistance Bed Mobility: Sit to Supine;Supine to Sit     Supine to sit: Max assist;HOB elevated     General bed mobility comments: Pt provided limited active assitance with sup to sit  Transfers Overall transfer level: Needs assistance Equipment used: 2 person hand  held assist Transfers: Sit to/from [facility] Sit to Stand: +2 physical assistance;Max assist Stand pivot transfers: +2 physical assistance;Max assist       General transfer comment: Physical assistance provided for posterior positioning of pt's L foot prior to standing and max verbal and tactile cues needed for sequencing.  Pt provided very little active assistance during transfers requiring near total assist.  Ambulation/Gait             General Gait Details: Unable/unsafe to attempt   Stairs             Wheelchair Mobility    Modified Rankin (Stroke Patients Only)       Balance Overall balance assessment: Needs assistance Sitting-balance support: Bilateral upper extremity supported Sitting balance-Leahy Scale: Poor Sitting balance - Comments: Leans posteriorly with little active attempt to correct Postural control: Posterior lean;Left lateral lean Standing balance support: Bilateral upper extremity supported;During functional activity Standing balance-Leahy Scale: Poor                              Cognition Arousal/Alertness: Awake/alert Behavior During Therapy: WFL for tasks assessed/performed Overall Cognitive Status: Within Functional Limits for tasks assessed                                        Exercises Total Joint Exercises Ankle Circles/Pumps: AROM;Strengthening;Both;10 reps Hip ABduction/ADduction: AAROM;Both;10 reps Straight Leg Raises: AAROM;Both;10 reps  General Comments        Pertinent Vitals/Pain Pain Assessment: 0-10 Pain Score: 9  Pain Location: back pain Pain Descriptors / Indicators: Aching;Sore Pain Intervention(s): Premedicated before session;Monitored during session    Home Living                      Prior Function            PT Goals (current goals can now be found in the care plan section) Progress towards PT goals: Not progressing toward goals - comment     Frequency    Min 2X/week      PT Plan Current plan remains appropriate    Co-evaluation              AM-PAC PT "6 Clicks" Mobility   Outcome Measure  Help needed turning from your back to your side while in a flat bed without using bedrails?: A Lot Help needed moving from lying on your back to sitting on the side of a flat bed without using bedrails?: A Lot Help needed moving to and from a bed to a chair (including a wheelchair)?: Total Help needed standing up from a chair using your arms (e.g., wheelchair or bedside chair)?: Total Help needed to walk in hospital room?: Total Help needed climbing 3-5 steps with a railing? : Total 6 Click Score: 8    End of Session Equipment Utilized During Treatment: Gait belt;Oxygen Activity Tolerance: Patient tolerated treatment well Patient left: Other (comment)(Pt left on BSC with CNA in the room) Nurse Communication: Mobility status PT Visit Diagnosis: Muscle weakness (generalized) (M62.81);Difficulty in walking, not elsewhere classified (R26.2);Pain Pain - Right/Left: Right Pain - part of body: Hip     Time: 1610-9604 PT Time Calculation (min) (ACUTE ONLY): 16 min  Charges:  $Therapeutic Activity: 8-22 mins                     D. Scott Noah Lembke PT, DPT 02/01/19, 12:16 PM

## 2019-02-01 NOTE — Progress Notes (Signed)
   Subjective:    Patient reports pain as mild.   Patient is well, and has had no acute complaints or problems We will start therapy today.  Plan is to go Rehab after hospital stay. no nausea and no vomiting Patient denies any chest pains or shortness of breath.  Objective: Vital signs in last 24 hours: Temp:  [97.3 F (36.3 C)-98.8 F (37.1 C)] 98 F (36.7 C) (08/03 0518) Pulse Rate:  [110-113] 110 (08/03 0518) Resp:  [18-20] 20 (08/03 0518) BP: (101-110)/(63-72) 110/72 (08/03 0518) SpO2:  [93 %-100 %] 100 % (08/03 0518) Weight:  [47.9 kg-51.3 kg] 47.9 kg (08/03 0518) Wound vac in place and still draining. Distal wound draining and does not appear to have gone outside of boundary that had previously been marked. Heels are non tender and elevated off the bed using rolled towels Intake/Output from previous day: 08/02 0701 - 08/03 0700 In: 480 [P.O.:480] Out: 950 [Urine:875; Drains:75] Intake/Output this shift: No intake/output data recorded.  Recent Labs    01/31/19 0324 01/31/19 0849  HGB 7.6* 8.0*   Recent Labs    01/31/19 0324  WBC 7.8  RBC 2.64*  HCT 25.5*  PLT 190   Recent Labs    01/31/19 0324  NA 132*  K 3.7  CL 91*  CO2 33*  BUN 25*  CREATININE 1.36*  GLUCOSE 94  CALCIUM 8.7*   Recent Labs    01/31/19 0324 02/01/19 0410  INR 3.3* 2.7*    EXAM General - Patient is Alert and Confused Extremity - Neurologically intact Sensation intact distally Intact pulses distally No cellulitis present still significant swelling/hematoma to right hip. Dressing - moderate drainage and wound vac in place Motor Function - intact, moving foot and toes well on exam.    Past Medical History:  Diagnosis Date  . Arthritis    "back, hands, knees" (04/10/2016)  . Chronic back pain   . 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 EF 25% by echo in 08/2015 b. RHC in 08/2015 showed normal filling pressures; c.  10/2018 Echo (in setting of atrial tachycardia): EF 25-30%, impaired relaxation. Glob HK. RVSP 81mmHg. Mod dil RA/LA. Mech MV w/ nl fxn (mean grad 91mmHg). Mod TR.   Marland Kitchen Chronic kidney disease   . Compression fracture    "several; all in my back" (04/10/2016)  . GERD (gastroesophageal reflux disease)   . Hypertension   . Lymphedema   . Pacemaker 05/2017  . PAF (paroxysmal atrial fibrillation) ([place])    a. On amio & Coumadin - CHA2DS2VASc = 4.  . S/P MVR (mitral valve replacement)    a. MVR 1994 b. redo MVR in 08/2014 - on Coumadin  . Shortness of breath dyspnea     Assessment/Plan:    Active Problems:   Hip fracture requiring operative repair ([place])  Estimated body mass index is 21.33 kg/m as calculated from the following:   Height as of this encounter: 4\' 11"  (1.499 m).   Weight as of this encounter: 47.9 kg. Up with therapy Discharge to SNF  Labs: INR 2.7 DVT Prophylaxis - Coumadin and TED hose Weight-Bearing as tolerated to right leg D/C O2 and Pulse OX and try on Room Air Continue wound vac Change dressing as needed. Sutures remain in place  [facility] PA [place] 02/01/2019, 7:56 AM

## 2019-02-01 NOTE — Progress Notes (Signed)
Occupational Therapy Re-Evaluation Patient Details Name: [name] MRN: [id number] DOB: May 14, 1935 Today's Date: 02/01/2019    History of present illness  84 y.o. female with a known history of right hip repair at [facility] on 01/11/2019 secondary to mechanical fall at home, paroxysmal atrial fibrillation, mitral valve replacement on Coumadin, COPD, CKD stage III, hypertension, history of left lower extremity hematoma with evacuation and porcine graft performed on 12/25/2018.   OT comments  [name] was seen for OT re-evaluation/treatment on this date. Per RN, pt could be seen for OT tx this afternoon despite some mild confusion/hallucinations on this date. Upon arrival to pt room, pt semi-supine in bed stating she had to look through her room because "things have been re-arranged since I was at school". OT encouraged pt to answer A&O questions and provided re-assurance that her room had not been re-arranged. Pt agreeable to OT tx. Pt has demonstrated some functional decline since initial OT evaluation. She currently requires Max assist +2 to complete STS and SPT to [facility], and otherwise engages in limited functional mobility. She is currently bed-level max to total assist for most other ADL tasks. Despite these challenges, she has begun to engage in more self-feeding and self-care tasks as well as UE and LE ther-ex daily with support from her family members who are regularly present at bedside. On this date, [name] engaged in light UE there-ex. OT educated pt and dtr, [name], about use of a light resistance (yellow) Theraband to promote improved strength and UE coordination. Latex-free theraband and written HEP provided with instructions on adapting exercises to bed-level to meet pt current functional status. POC updated to reflect current pt status and functional level. Pt continues to benefit from skilled OT services to maintain current function and support improvement in functional UE use  and ADL mgt. Discharge recommendation remains appropriate. Frequency downgraded to 1x/week minimum.    Follow Up Recommendations  SNF    Equipment Recommendations  Other (comment)(TBD)    Recommendations for Other Services      Precautions / Restrictions Precautions Precautions: Fall Precaution Comments: fragile skin Restrictions Weight Bearing Restrictions: Yes RLE Weight Bearing: Weight bearing as tolerated       Mobility Bed Mobility Overal bed mobility: Needs Assistance Bed Mobility: Sit to Supine;Supine to Sit     Supine to sit: Max assist;HOB elevated Sit to supine: Max assist;HOB elevated   General bed mobility comments: Pt provided limited active assitance with sup to sit  Transfers Overall transfer level: Needs assistance Equipment used: 2 person hand held assist Transfers: Sit to/from [facility] Sit to Stand: +2 physical assistance;Max assist Stand pivot transfers: +2 physical assistance;Max assist       General transfer comment: At this time, pt is near dependent assist for functional transfers. Makes limited attempts at active assistance during transfers.    Balance Overall balance assessment: Needs assistance Sitting-balance support: Bilateral upper extremity supported Sitting balance-Leahy Scale: Poor Sitting balance - Comments: Leans posteriorly with little active attempt to correct Postural control: Posterior lean;Left lateral lean Standing balance support: Bilateral upper extremity supported;During functional activity Standing balance-Leahy Scale: Poor                             ADL either performed or assessed with clinical judgement   ADL Overall ADL's : Needs assistance/impaired Eating/Feeding: Bed level;Maximal assistance Eating/Feeding Details (indicate cue type and reason): Pt demonstrating improved self-feeding ability as well  as increased BUE strength and AROM Grooming: Bed level;Maximal  assistance Grooming Details (indicate cue type and reason): Pt continues to fatigue easily and pain in BUEs limits function and participation in ADLs Upper Body Bathing: Total assistance;Bed level;Set up   Lower Body Bathing: Total assistance;Bed level;Set up   Upper Body Dressing : Bed level;Maximal assistance;Set up   Lower Body Dressing: Total assistance;Set up;Bed level   Toilet Transfer: BSC;Stand-pivot;Maximal assistance;+2 for physical assistance   Toileting- Clothing Manipulation and Hygiene: +2 for physical assistance;Total assistance;Set up               Vision Baseline Vision/History: Wears glasses Wears Glasses: At all times Patient Visual Report: No change from baseline     Perception     Praxis      Cognition Arousal/Alertness: Awake/alert Behavior During Therapy: WFL for tasks assessed/performed Overall Cognitive Status: Impaired/Different from baseline Area of Impairment: Following commands                       Following Commands: Follows one step commands inconsistently       General Comments: Per RN pt has been mildly confused on this date. Upon OT arrival to room, pt stated "things have been re-arranged while I was at school", however when asked A&O questions, pt was able to answer all 4 with near accuracy. Stated date was December 21st. Oth[place]ise A&O x 4.        Exercises  Other Exercises: Pt and dtr educated in UE there-ex with yellow, latex-free, theraband on this date. Written HEP provided. Pt dtr, [name], and this Pryor Curia discussed strategies for adapting exercies for bed-level there-ex as pt is limited in functional mobility at this time.   Shoulder Instructions       General Comments BLE bandates/wound vac in place at start/end of session.    Pertinent Vitals/ Pain       Pain Assessment: 0-10 Pain Score: 9  Faces Pain Scale: Hurts little more Pain Location: back pain Pain Descriptors / Indicators: Aching;Sore Pain  Intervention(s): Limited activity within patient's tolerance;Monitored during session;Premedicated before session  Home Living                                          Prior Functioning/Environment              Frequency  Min 1X/week        Progress Toward Goals  OT Goals(current goals can now be found in the care plan section)  Progress towards OT goals: Goals drowngraded-see care plan  Acute Rehab OT Goals Patient Stated Goal: start doing more for myself again OT Goal Formulation: With patient Time For Goal Achievement: March 14, 2019 Potential to Achieve Goals: Good  Plan Discharge plan remains appropriate;Frequency needs to be updated    Co-evaluation                 AM-PAC OT "6 Clicks" Daily Activity     Outcome Measure   Help from another person eating meals?: A Lot Help from another person taking care of personal grooming?: A Lot Help from another person toileting, which includes using toliet, bedpan, or urinal?: Total Help from another person bathing (including washing, rinsing, drying)?: Total Help from another person to put on and taking off regular upper body clothing?: A Lot Help from another person to put on and taking off regular  lower body clothing?: Total 6 Click Score: 9    End of Session Equipment Utilized During Treatment: Other (comment)(theraband)  OT Visit Diagnosis: Muscle weakness (generalized) (M62.81);History of falling (Z91.81);Pain Pain - Right/Left: Right Pain - part of body: Hip   Activity Tolerance Patient limited by pain;Patient limited by fatigue   Patient Left in bed;with call bell/phone within reach;with family/visitor present;with bed alarm set   Nurse Communication Other (comment)(Pt given theraband and HEP)        Time: 1355-1418 OT Time Calculation (min): 23 min  Charges: OT General Charges $OT Visit: 1 Visit OT Evaluation $OT Re-eval: 1 Re-eval OT Treatments $Self Care/Home Management :  8-22 mins $Therapeutic Exercise: 8-22 mins    Shara Blazing, M.S., OTR/L Ascom: [phone] 02/01/19, 3:57 PM

## 2019-02-01 NOTE — Progress Notes (Signed)
Patient ID: [name], female   DOB: 1934/11/21, 84 y.o.   MRN: [id number]   [employer] PROGRESS NOTE  [name] HER:[id number] DOB: Oct 22, 1934 DOA: 01/23/2019 PCP: Rusty Aus, MD  HPI/Subjective:  Patient seen and evaluated today Wound VAC placed per orthopedic recommendation < 100 ml drainage. Tolerating diet well No constipation No fever On oxygen via nasal cannula at 2 L More awake and responds to verbal commands  Objective: Vitals:   02/01/19 0518 02/01/19 0802  BP: 110/72 111/73  Pulse: (!) 110 (!) 111  Resp: 20 (!) 22  Temp: 98 F (36.7 C) 98.1 F (36.7 C)  SpO2: 100% 100%    Intake/Output Summary (Last 24 hours) at 02/01/2019 1643 Last data filed at 02/01/2019 1608 Gross per 24 hour  Intake 3 ml  Output 650 ml  Net -647 ml   Filed Weights   01/30/19 0327 01/31/19 0900 02/01/19 0518  Weight: 55.8 kg 51.3 kg 47.9 kg    ROS: Review of Systems  Constitutional: Negative for chills and fever.  Eyes: Negative for blurred vision.  Respiratory: Negative for cough and shortness of breath.   Cardiovascular: Negative for chest pain.  Gastrointestinal: Negative for abdominal pain, diarrhea, nausea and vomiting.  Genitourinary: Negative for dysuria.  Musculoskeletal: Positive for joint pain.  Neurological: Negative for dizziness and headaches.   Exam: Physical Exam  HENT:  Nose: No mucosal edema.  Mouth/Throat: No oropharyngeal exudate or posterior oropharyngeal edema.  Eyes: Pupils are equal, round, and reactive to light. Conjunctivae, EOM and lids are normal.  Neck: No JVD present. Carotid bruit is not present. No edema present. No thyroid mass and no thyromegaly present.  Cardiovascular: S1 normal and S2 normal. Exam reveals no gallop.  Murmur heard.  Systolic murmur is present with a grade of 3/6. Respiratory: No respiratory distress. She has decreased breath sounds in the right lower field and the left lower field. She has no wheezes.  She has no rhonchi. She has no rales.  GI: Soft. Bowel sounds are normal. There is no abdominal tenderness.  Musculoskeletal:     Right ankle: She exhibits swelling.     Left ankle: She exhibits swelling.  Lymphadenopathy:    She has no cervical adenopathy.  Neurological: She is alert. No cranial nerve deficit.  Generalized weakness, moves both upper limbs - power 3/5, both lower limbs 2-3/5  Skin: Skin is warm. Nails show no clubbing.  Psychiatric: She has a normal mood and affect.    Data Reviewed: Basic Metabolic Panel: Recent Labs  Lab 01/26/19 0346 01/27/19 0446 01/31/19 0324  NA 138 137 132*  K 3.4* 4.2 3.7  CL 89* 92* 91*  CO2 39* 38* 33*  GLUCOSE 83 85 94  BUN 29* 25* 25*  CREATININE 1.20* 1.26* 1.36*  CALCIUM 8.6* 8.8* 8.7*  CBC: Recent Labs  Lab 01/26/19 0346 01/27/19 0446 01/28/19 0441 01/31/19 0324 01/31/19 0849  WBC 6.6 7.4  --  7.8  --   HGB 8.9* 9.1* 8.4* 7.6* 8.0*  HCT 30.6* 31.4* 28.6* 25.5*  --   MCV 96.8 97.5  --  96.6  --   PLT 190 186  --  190  --    BNP (last 3 results) Recent Labs    11/04/18 1141 12/04/18 1627 12/04/18 1954  BNP 505.0* 1,189.0* 992.0*     Recent Results (from the past 240 hour(s))  Urine Culture     Status: Abnormal (Preliminary result)   Collection Time: 01/31/19  2:18 PM   Specimen: Urine, Random  Result Value Ref Range Status   Specimen Description   Final    URINE, RANDOM Performed at [facility], [address] [place], [place] [address]    Special Requests   Final    NONE Performed at [facility], [address] [place], [place] [address]    Culture (A)  Final    >=100,000 COLONIES/mL ENTEROCOCCUS FAECALIS SUSCEPTIBILITIES TO FOLLOW Performed at [facility], [place] [address] [place], [place] [address]    Report Status PENDING  Incomplete     Studies: No results found.  Scheduled Meds: . acetaminophen  650 mg Oral Once  . amiodarone  200 mg Oral Daily  .  collagenase   Topical Daily  . docusate sodium  100 mg Oral BID  . furosemide  20 mg Oral BID  . mouth rinse  15 mL Mouth Rinse BID  . multivitamin-lutein  1 capsule Oral Daily  . pantoprazole  40 mg Oral Daily  . sodium chloride flush  3 mL Intravenous Q12H  . vitamin C  500 mg Oral Daily  . warfarin  1 mg Oral ONCE-1800  . Warfarin - Pharmacist Dosing Inpatient   Does not apply q1800   Continuous Infusions:  Assessment/Plan:  1. Acute on chronic combined systolic and diastolic heart failure.  Continue oral Lasix patient stable 2. Paroxysmal atrial fibrillation and history of mitral valve replacement on amiodarone and Coumadin- Pharmacy to manage dose for therapeutic INR. 3. Acute blood loss anemia secondary to large right hip hematoma and skin tear right leg.  Also has other hematomas on the legs. Hemoglobin stable. 4. Hip fracture status post operative repair at [facility].  Physical therapy recommending rehab.  Family wants patient to go home once clinically more stable and has enough endurance and strength.  Do not want SNF placement. 5. Weakness. Status post physical therapy evaluation-suggested rehab placement but family does not want her to go to rehab and would like to take her home once she is able to transfer from bed to commode or wheelchair.  As patient had long hospital stays and has significant weakness, she is still requiring 2+ assist stand up and it does not seem possible in the near future that she might be able to have that much improvement and to move without significant support. 6. Chronic kidney disease stage III.  Watch with diuresis, stable. 7. sacral decubiti.  local wound care.  Frequent changing positions 8. Large right hip hematoma.  Needs to be on Coumadin secondary to metallic mitral valve.  Continue to monitor.  Patient has been followed by orthopedics here .  Wound VAC placed.F/U with ortho regarding wound vac continuity as drainage has decreased. 9. Skin tear right  lower extremity.  Supportive care.  Daily wound dressings and wound care team follow-up.Wound care dressings every alternate day. 10.  Altered mental status-as per patient's family today she has worsening altered mental status than her baseline.  They had concern about UTi. 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  This could be due to presence of Foley catheter.  Does not seem to have UTI.  I have added a urine culture to check for presence of bacteria causing UTI.  Awaited cx report.   Code Status: DNR    Code Status Orders  (From admission, onward)         Start     Ordered  01/19/19 0004  Do not attempt resuscitation (DNR)  Continuous    Question Answer Comment  In the event of cardiac or respiratory ARREST Do not call a "code blue"   In the event of cardiac or respiratory ARREST Do not perform Intubation, CPR, defibrillation or ACLS   In the event of cardiac or respiratory ARREST Use medication by any route, position, wound care, and other measures to relive pain and suffering. May use oxygen, suction and manual treatment of airway obstruction as needed for comfort.      01/19/19 0003        Code Status History    Date Active Date Inactive Code Status Order ID Comments User Context   01/22/2019 2311 01/19/2019 0003 Full Code [id number]  Mayer Camel, NP Inpatient   01/13/2019 2309 01/21/2019 2311 Full Code [id number]  Mayer Camel, NP Inpatient   12/30/2018 1220 01/06/2019 0608 DNR [id number]  Ottie Glazier, MD Inpatient   12/26/2018 0147 12/30/2018 1220 Full Code [id number]  Lance Coon, MD Inpatient   12/04/2018 1937 12/15/2018 1643 Full Code [id number]  Mayer Camel, NP Inpatient   11/24/2018 1621 11/30/2018 2027 Full Code [id number]  Fritzi Mandes, MD Inpatient   11/24/2018 1557 11/24/2018 1620 DNR [id number]  Fritzi Mandes, MD Inpatient   11/09/2018 0333 11/24/2018 1557 Full Code [id number]  Mayer Camel, NP ED   11/04/2018 1650 11/08/2018 2006  Full Code [id number]  Saundra Shelling, MD Inpatient   04/10/2016 2034 04/19/2016 1757 Full Code [id number]  Vianne Bulls, MD Inpatient   09/29/2015 1053 10/10/2015 1406 Full Code [id number]  [name], MD Inpatient   04/28/2015 1626 04/30/2015 1422 Full Code [id number]  Demetrios Loll, MD Inpatient   Advance Care Planning Activity    Advance Directive Documentation     Most Recent Value  Type of Advance Directive  Out of facility DNR (pink MOST or yellow form)  Pre-existing out of facility DNR order (yellow form or pink MOST form)  Yellow form placed in chart (order not valid for inpatient use)  "MOST" Form in Place?  -     Family Communication: Family updated- spoke to her daughter in room today. Disposition Plan: To be determined based on clinical course  Time spent: 34 minutes  [employer]

## 2019-02-01 NOTE — Care Management Important Message (Signed)
Important Message  Patient Details  Name: [name] MRN: [id number] Date of Birth: [birth year]   Medicare Important Message Given:  Yes     [name] 02/01/2019, 11:47 AM

## 2019-02-02 DIAGNOSIS — R000 Tachycardia, unspecified: Secondary | ICD-10-CM

## 2019-02-02 DIAGNOSIS — I5023 Acute on chronic systolic (congestive) heart failure: Secondary | ICD-10-CM

## 2019-02-02 DIAGNOSIS — I471 Supraventricular tachycardia: Secondary | ICD-10-CM

## 2019-02-02 LAB — CBC
HCT: 26.1 % — ABNORMAL LOW (ref 36.0–46.0)
Hemoglobin: 7.8 g/dL — ABNORMAL LOW (ref 12.0–15.0)
MCH: 29.4 pg (ref 26.0–34.0)
MCHC: 29.9 g/dL — ABNORMAL LOW (ref 30.0–36.0)
MCV: 98.5 fL (ref 80.0–100.0)
Platelets: 230 10*3/uL (ref 150–400)
RBC: 2.65 MIL/uL — ABNORMAL LOW (ref 3.87–5.11)
RDW: 19.8 % — ABNORMAL HIGH (ref 11.5–15.5)
WBC: 7.8 10*3/uL (ref 4.0–10.5)
nRBC: 0 % (ref 0.0–0.2)

## 2019-02-02 LAB — BASIC METABOLIC PANEL
Anion gap: 10 (ref 5–15)
BUN: 27 mg/dL — ABNORMAL HIGH (ref 8–23)
CO2: 32 mmol/L (ref 22–32)
Calcium: 8.7 mg/dL — ABNORMAL LOW (ref 8.9–10.3)
Chloride: 95 mmol/L — ABNORMAL LOW (ref 98–111)
Creatinine, Ser: 1.57 mg/dL — ABNORMAL HIGH (ref 0.44–1.00)
GFR calc Af Amer: 35 mL/min — ABNORMAL LOW (ref >60)
GFR calc non Af Amer: 31 mL/min — ABNORMAL LOW (ref >60)
Glucose, Bld: 100 mg/dL — ABNORMAL HIGH (ref 70–99)
Potassium: 3.4 mmol/L — ABNORMAL LOW (ref 3.5–5.1)
Sodium: 137 mmol/L (ref 135–145)

## 2019-02-02 LAB — URINE CULTURE: Culture: >=100000 — AB

## 2019-02-02 LAB — BRAIN NATRIURETIC PEPTIDE: B Natriuretic Peptide: 1572 pg/mL — ABNORMAL HIGH (ref 0.0–100.0)

## 2019-02-02 LAB — PROTIME-INR
INR: 2.5 — ABNORMAL HIGH (ref 0.8–1.2)
Prothrombin Time: 26.2 seconds — ABNORMAL HIGH (ref 11.4–15.2)

## 2019-02-02 MED ORDER — AMIODARONE HCL 200 MG PO TABS
200.0000 mg | ORAL_TABLET | Freq: Two times a day (BID) | ORAL | Status: DC
  Administered 2019-02-03 – 2019-02-13 (×22): 200 mg via ORAL
  Filled 2019-02-02 (×22): qty 1

## 2019-02-02 MED ORDER — SODIUM CHLORIDE 0.9 % IV SOLN
INTRAVENOUS | Status: DC
  Administered 2019-02-02: 10:00:00 via INTRAVENOUS

## 2019-02-02 MED ORDER — VANCOMYCIN HCL IN DEXTROSE 1-5 GM/200ML-% IV SOLN
1000.0000 mg | Freq: Once | INTRAVENOUS | Status: AC
  Administered 2019-02-02: 1000 mg via INTRAVENOUS
  Filled 2019-02-02: qty 200

## 2019-02-02 MED ORDER — METOPROLOL SUCCINATE ER 25 MG PO TB24
12.5000 mg | ORAL_TABLET | Freq: Every day | ORAL | Status: DC
  Administered 2019-02-03: 12.5 mg via ORAL
  Filled 2019-02-02: qty 1

## 2019-02-02 MED ORDER — VANCOMYCIN VARIABLE DOSE PER UNSTABLE RENAL FUNCTION (PHARMACIST DOSING): Status: DC

## 2019-02-02 MED ORDER — WARFARIN SODIUM 1 MG PO TABS
1.5000 mg | ORAL_TABLET | Freq: Once | ORAL | Status: DC
  Filled 2019-02-02: qty 1

## 2019-02-02 MED ORDER — FOSFOMYCIN TROMETHAMINE 3 G PO PACK
3.0000 g | PACK | Freq: Once | ORAL | Status: AC
  Administered 2019-02-02: 3 g via ORAL
  Filled 2019-02-02: qty 3

## 2019-02-02 MED ORDER — FUROSEMIDE 10 MG/ML IJ SOLN
20.0000 mg | Freq: Once | INTRAMUSCULAR | Status: AC
  Administered 2019-02-02: 20 mg via INTRAVENOUS
  Filled 2019-02-02: qty 2

## 2019-02-02 MED ORDER — METOPROLOL SUCCINATE ER 25 MG PO TB24: 12.5000 mg | ORAL_TABLET | Freq: Every day | ORAL | Status: DC

## 2019-02-02 MED ORDER — VANCOMYCIN HCL 1000 MG IV SOLR
1000.0000 mg | Freq: Once | INTRAVENOUS | Status: DC
  Filled 2019-02-02: qty 1000

## 2019-02-02 MED ORDER — FUROSEMIDE 10 MG/ML IJ SOLN
40.0000 mg | Freq: Once | INTRAMUSCULAR | Status: DC
  Filled 2019-02-02: qty 4

## 2019-02-02 NOTE — Progress Notes (Signed)
Son at bedside concerned about patient's decline over the past few days. Patient with positive urine culture and given dose of oral fosfomycin as suspension in water. Patient only drank about half despite daughter's encouragement this morning. Son is requesting IV antibiotics and aggressive treatment due to her recent decline. I spoke to pharmacy and Dr. Anselm Jungling regarding potential alternatives for antibiotic therapy, but limited options due to culture results and patient allergies. MD did place order for IV vanc to be started today. Will give and monitor. Son also discussed atrial tachycardia which patient is currently in. States that in the past medications have not been very effective, but cardioversion has been successful - Dr. Saunders Revel and Mickle Plumb, [place] were made aware. Current dose of metoprolol scheduled for today held due to BP parameters (Dr. Saunders Revel and Mickle Plumb, [place] aware). Will continue to monitor.  Patient alert, but confused this morning. Did well taking morning meds whole with water while daughter was at bedside; all dressings changed this morning as well, daughter observed. Currently sleeping with son at bedside.

## 2019-02-02 NOTE — Consult Note (Signed)
Cardiology Consultation:   Patient ID: LIYAH HIGHAM MRN: [id number]; DOB: 01-25-1935  Admit date: 01/03/2019 Date of Consult: 02/02/2019  Primary Care Provider: Rusty Aus, MD Primary Cardiologist: Ida Rogue, MD  Primary Electrophysiologist:  None    Patient Profile:   [name] is a 84 y.o. female with a hx of atrial fibrillation, chronic combined systolic and diastolic congestive heart failure, nonischemic cardiomyopathy status post CRT-P (11/2017), left bundle branch block, hypertension, chronic lower extremity edema, mitral valve disease status post mechanical mitral valve replacement on chronic Coumadin, recent spontaneous left calf hematoma requiring debridement, stage III chronic kidney disease, 11/2018 mechanical fall with subsequent R hip fracture and later R hip surgery ([facility]), and who presented for follow-up after transferred back to [facility] from [facility] and seen for the evaluation of cardiac medications in the setting of the above complex medical and cardiac conditions s/p right-sided intertrochanteric femur fracture and repair as above at the request of Dr. Anselm Jungling.  History of Present Illness:   [name] is an 84 year old female with above complex past medical history including paroxysmal atrial fibrillation, chronic combined systolic and diastolic congestive heart failure, nonischemic cardiomyopathy s/p CRT-P (difficulty placing LV lead without change in CHF symptoms after) left bundle branch block, hypertension, chronic lower extremity edema, mitral valve disease s/p mechanical mitral valve on chronic Coumadin, and stage III chronic kidney disease.  In May 2020, she was admitted with heart failure and progressive worsening renal function.  Echo showed EF 25 to 30%, which was down from prior EF of approximately 40%.  She was also noted to be in atrial tachycardia and required conversion on Nov 06, 2018, which was successful.  She was discharged home  5/10 with slightly supratherapeutic INR and subsequently developed worsening left calf pain and swelling.  She required readmission due to a spontaneous left calf hematoma and required a total of 3 units of packed red blood cells as well as debridement and biological graft placement, and plastic surgical consultation.  Wound culture grew Enterobacter, and she was treated with Cipro.  Prior to discharge, she was desaturating into the 80s with ambulation; however, it was felt that she simply need more physical therapy.  She was seen in clinic 6/5, and and she was noted to have dyspnea on 6 L of oxygen and lower extremity edema.  Chest x-ray showed significant bilateral pulmonary edema.  She was directly admitted to [facility].  She was aggressively diuresed and discharged home at 143 pounds on 6/16 with significant improvement in her lower extremity edema.  Discharge dose of Lasix was 40 mg twice daily.  She was seen again 6/24 in the office and noted continued dyspnea with minimal exertion.  She was using between 3-4 L [place]. Her wt was documented at 145 lbs and she noted increased LEE. She reported dietary compliance.    She presented again to [facility] hospital 6/27 after a mechanical fall with subsequent hip fracture.  She was noted to have significant hematoma of her thigh with drastic drop in hemoglobin and Coumadin was placed on hold at admission (6/27), due to significant anemia and her receiving PRBCs. She was noted to be volume overloaded with losartan and BB on hold as medical therapy was limited by hypotension, blood loss anemia, and worsening renal function.  Heparin drip was continued in place of Coumadin for mechanical mitral valve.  She continued amiodarone (AV paced) and was also started on norepinephrine drip due to hypotension. [facility]  was asked to evaluate for preoperative cardiovascular risk for ORIF of right intertrochanter femur given the above complex medical and cardiac issues.   Per EMR, it was noted that the patient was high risk and not thought to gain much by undergoing surgery as she was doubtful to gain functional capacity given she was extremely deconditioned before her hip fracture and given her many medical conditions.  On 12/29/2018, it was noted that plan was to initiate CVVH that evening with plan to resume heparin in the setting of mechanical MVR, PAF, and CVVH. Due to family request, patient was medically optimized for safe transfer to tertiary facility and subsequently transferred to [facility] (admitted 7/8).  On arrival to [facility] 7/8, she was noted to be severely volume overloaded on exam and acutely decompensated heart failure.  She arrived on 6L [place]. She was started on Lasix drip at 15 mg/h and required metolazone 5 mg dosing for urine output.  Eventually, she was transitioned down to spot dosing of 80mg  IV lasix on 7/12 and underwent underwent orthopedic surgery on 7/13 with postop course complicated by decompensated HFrEF, requiring Lasix GTT, delirium due to intermittent hypercapnia (patient intermittently declining BiPAP), as well as elevated BUN (without indication for HD).  After diuresis, she eventually restarted beta-blocker.  During the admission, she received 1 unit of blood on 7/18 for hemoglobin 6.8, which was thought likely related to right lower extremity hematoma stabilized thereafter.  She was restarted on warfarin 2 mg on 7/15 after heparin bridge; however, her INR went up from 2-3 on 7/20 (date of discharge) so her warfarin dose held.  It was recommended she hold her warfarin 7/20 and follow-up with an INR in a.m. (7/21) to titrate as needed.  It was felt that her warfarin medication regimen was complicated by intermittent refusal to take medication and declining of blood work (typically able to comply with encouragement). Lasix infusion discontinued 7/19. At the time of discharge / transfer back to [facility] on 7/20, it was noted that  the patient was currently bridging heparin to warfarin for treatment of mechanical mitral valve and atrial fibrillation.  It was noted that she was net negative 5.8L for the admission.  She has been recovering at [facility] since transfer back on 7/20. [facility] cardiology was consulted for management of medications. She was on 2L [place].  Wound VAC was placed per orthopedic recommendation, as she was noted to be status post right hip hematoma and skin tear (s/p ORIF of R intertrochanteric femur fracture) along the right leg at the time of consultation. Per EMR, family did not want SNF placement. ICD interrogated with findings to be scanned to chart and showing patient back in atrial tachycardia with rates 110-120s on telemetry.  Heart Pathway Score:     Past Medical History:  Diagnosis Date   Arthritis    "back, hands, knees" (04/10/2016)   Chronic back pain    Chronic combined systolic (congestive) and diastolic (congestive) heart failure (HCC)    a. EF 25% by echo in 08/2015 b. RHC in 08/2015 showed normal filling pressures; c. 10/2018 Echo (in setting of atrial tachycardia): EF 25-30%, impaired relaxation. Glob HK. RVSP 22mmHg. Mod dil RA/LA. Mech MV w/ nl fxn (mean grad 22mmHg). Mod TR.    Chronic kidney disease    Compression fracture    "several; all in my back" (04/10/2016)   GERD (gastroesophageal reflux disease)    Hypertension    Lymphedema    Pacemaker 05/2017  PAF (paroxysmal atrial fibrillation) ([place])    a. On amio & Coumadin - CHA2DS2VASc = 4.   S/P MVR (mitral valve replacement)    a. MVR 1994 b. redo MVR in 08/2014 - on Coumadin   Shortness of breath dyspnea     Past Surgical History:  Procedure Laterality Date   BREAST LUMPECTOMY Right 2005?   benign lump excision   CARDIAC CATHETERIZATION     CARDIAC VALVE REPLACEMENT  1994, 2016   "MVR; MVR"   CARDIOVERSION N/A 11/06/2018   Procedure: CARDIOVERSION;  Surgeon: Minna Merritts, MD;  Location: [facility] ORS;  Service:  Cardiovascular;  Laterality: N/A;   CATARACT EXTRACTION W/PHACO Right 02/20/2015   Procedure: CATARACT EXTRACTION PHACO AND INTRAOCULAR LENS PLACEMENT ([place]);  Surgeon: Estill Cotta, MD;  Location: [facility] ORS;  Service: Ophthalmology;  Laterality: Right;  US01:31.2 AP 25.3% CDE40.13 Fluid lot # [id number]   DRESSING CHANGE UNDER ANESTHESIA Left 11/17/2018   Procedure: DRESSING CHANGE;  Surgeon: Robert Bellow, MD;  Location: [facility] ORS;  Service: General;  Laterality: Left;  no anesthesia   ELECTROPHYSIOLOGIC STUDY N/A 10/09/2015   Procedure: CARDIOVERSION;  Surgeon: Wellington Hampshire, MD;  Location: [facility] ORS;  Service: Cardiovascular;  Laterality: N/A;   INCISION AND DRAINAGE OF WOUND Left 11/13/2018   Procedure: LEFT LEG DRESSING CHANGE;  Surgeon: Robert Bellow, MD;  Location: [facility] ORS;  Service: General;  Laterality: Left;   INCISION AND DRAINAGE OF WOUND Left 11/19/2018   Procedure: LEFT LEG DRESSING CHANGE AND GRAFT APPLICATION;  Surgeon: Robert Bellow, MD;  Location: [facility] ORS;  Service: General;  Laterality: Left;   IR GENERIC HISTORICAL  04/01/2016   IR RADIOLOGIST EVAL & MGMT 04/01/2016 [facility]   IR GENERIC HISTORICAL  04/15/2016   IR VERTEBROPLASTY CERV/THOR BX INC UNI/BIL INC/INJECT/IMAGING 04/15/2016 Luanne Bras, MD [facility]   IR GENERIC HISTORICAL  04/15/2016   IR VERTEBROPLASTY EA ADDL (T&LS) BX INC UNI/BIL INC INJECT/IMAGING 04/15/2016 Luanne Bras, MD [facility]   IR GENERIC HISTORICAL  04/15/2016   IR VERTEBROPLASTY EA ADDL (T&LS) BX INC UNI/BIL INC INJECT/IMAGING 04/15/2016 Luanne Bras, MD [facility]   IR GENERIC HISTORICAL  05/20/2016   IR RADIOLOGIST EVAL & MGMT 05/20/2016 [facility]   IR GENERIC HISTORICAL  06/13/2016   IR VERTEBROPLASTY EA ADDL (T&LS) BX INC UNI/BIL INC INJECT/IMAGING 06/13/2016 Luanne Bras, MD [facility]   IR GENERIC HISTORICAL  06/13/2016   IR SACROPLASTY BILATERAL 06/13/2016 Luanne Bras, MD [facility]   IR GENERIC HISTORICAL  06/13/2016   IR VERTEBROPLASTY LUMBAR BX INC UNI/BIL INC/INJECT/IMAGING 06/13/2016 Luanne Bras, MD [facility]   IRRIGATION AND DEBRIDEMENT HEMATOMA Left 07/05/2015   Procedure: IRRIGATION AND DEBRIDEMENT HEMATOMA;  Surgeon: Robert Bellow, MD;  Location: [facility] ORS;  Service: General;  Laterality: Left;   IRRIGATION AND DEBRIDEMENT HEMATOMA Left 11/11/2018   Procedure: IRRIGATION AND DEBRIDEMENT LEFT LEG HEMATOMA;  Surgeon: Robert Bellow, MD;  Location: [facility] ORS;  Service: General;  Laterality: Left;   Open reduction and internal fixation of a right intertrochanteric femur fracture  01/11/2019   Dr. Margarita Rana ([facility])   pyloric stenosis  07/1935   US ECHOCARDIOGRAPHY       Home Medications:  Prior to Admission medications   Medication Sig Start Date End Date Taking? Authorizing Provider  acetaminophen (TYLENOL) 325 MG tablet Take 2 tablets (650 mg total) by mouth every 6 (six) hours as needed for mild pain (or Fever >/= 101). Patient taking differently: Take 975  mg by mouth 3 (three) times daily.  01/05/19  Yes Darel Hong D, NP  amiodarone (PACERONE) 200 MG tablet Take 1 tablet (200 mg total) by mouth daily. 09/24/18  Yes Gollan, Kathlene November, MD  calcium carbonate (OSCAL) 1500 (600 Ca) MG TABS tablet Take 1,500 mg by mouth daily with breakfast.   Yes [provider]  cholecalciferol (VITAMIN D3) 10 MCG (400 UNIT) TABS tablet Take 1,600 Units by mouth daily.   Yes [provider]  diclofenac (FLECTOR) 1.3 % PTCH Place 1 patch onto the skin 2 (two) times daily.   Yes [provider]  FUROSEMIDE IN SODIUM CHLORIDE IV Inject 500 mg into the vein continuous. Rate 6ml/hr (10mg /hr)   Yes [provider]  ipratropium-albuterol (DUONEB) 0.5-2.5 (3) MG/3ML SOLN Take 3 mLs by nebulization every 6 (six) hours as needed for up to 90 doses. 12/15/18  Yes Gouru, Aruna, MD  lidocaine (LIDODERM) 5 % Place 1  patch onto the skin daily. Remove & Discard patch within 12 hours or as directed by MD   Yes [provider]  LIDOCAINE HCL IJ Inject 0.5 mLs as directed daily as needed (for PIV insertion).   Yes [provider]  Melatonin 5 MG TABS Take 1 tablet (5 mg total) by mouth at bedtime as needed (sleep). 01/05/19  Yes Darel Hong D, NP  metoprolol succinate (TOPROL-XL) 12.5 mg TB24 24 hr tablet Take 6.25 mg by mouth 2 (two) times a day.   Yes [provider]  multivitamin-lutein (OCUVITE-LUTEIN) CAPS capsule Take 1 capsule by mouth daily. 01/06/19  Yes Bradly Bienenstock, NP  ondansetron (ZOFRAN) 4 MG/2ML SOLN injection Inject 2 mLs (4 mg total) into the vein every 6 (six) hours as needed for nausea. 01/05/19  Yes Darel Hong D, NP  oxyCODONE (OXY IR/ROXICODONE) 5 MG immediate release tablet Take 2.5 mg by mouth every 4 (four) hours as needed for severe pain.   Yes [provider]  vitamin C (ASCORBIC ACID) 500 MG tablet Take 500 mg by mouth daily.   Yes [provider]  WARFARIN SODIUM PO Take 2.5 mg by mouth.    Yes [provider]  acetaminophen (TYLENOL) 650 MG suppository Place 1 suppository (650 mg total) rectally every 6 (six) hours as needed for mild pain (or Fever >/= 101). 01/05/19   Bradly Bienenstock, NP  Chlorhexidine Gluconate Cloth 2 % PADS Apply 6 each topically daily. Patient not taking: Reported on 01/23/2019 01/06/19   Darel Hong D, NP  heparin 1000 unit/mL SOLN injection 1-6 mLs (1,000-6,000 Units total) by CRRT route as needed (Use to fill CRRT catheter with heparin 1000 units/mL per catheter volume.). Patient not taking: Reported on 01/26/2019 01/05/19   Darel Hong D, NP  heparin 25000-0.45 UT/250ML-% infusion Inject 1,350 Units/hr into the vein continuous. Patient not taking: Reported on 01/20/2019 01/05/19   Bradly Bienenstock, NP  traMADol (ULTRAM) 50 MG tablet Take 1 tablet (50 mg total) by mouth every 6 (six) hours as needed for  moderate pain. Patient not taking: Reported on 01/13/2019 01/05/19   Bradly Bienenstock, NP    Inpatient Medications: Scheduled Meds:  acetaminophen  650 mg Oral Once   amiodarone  200 mg Oral Daily   collagenase   Topical Daily   docusate sodium  100 mg Oral BID   fosfomycin  3 g Oral Once   furosemide  40 mg Intravenous Once   furosemide  20 mg Oral BID   mouth rinse  15 mL Mouth Rinse BID   multivitamin-lutein  1 capsule Oral Daily   pantoprazole  40 mg Oral Daily   sodium chloride flush  3 mL Intravenous Q12H   vitamin C  500 mg Oral Daily   warfarin  1.5 mg Oral ONCE-1800   Warfarin - Pharmacist Dosing Inpatient   Does not apply q1800   Continuous Infusions:  PRN Meds: acetaminophen, acetaminophen, calcium carbonate, ipratropium-albuterol, liver oil-zinc oxide, Melatonin, menthol-cetylpyridinium **OR** phenol, metoCLOPramide **OR** metoCLOPramide (REGLAN) injection, nystatin, ondansetron **OR** ondansetron (ZOFRAN) IV, oxyCODONE  Allergies:    Allergies  Allergen Reactions   Ace Inhibitors     Cough   Doxycycline Nausea Only   Hydrocodone Itching and Nausea Only   Mercury     Other reaction(s): Unknown   Silver Dermatitis    Severe itching   Cephalexin Rash   Clindamycin/Lincomycin Rash   Nickel Rash   Penicillins Rash    Has patient had a PCN reaction causing immediate rash, facial/tongue/throat swelling, SOB or lightheadedness with hypotension: YES Has patient had a PCN reaction causing severe rash involving mucus membranes or skin necrosis: NO Has patient had a PCN reaction that required hospitalization NO Has patient had a PCN reaction occurring within the last 10 years: NO If all of the above answers are "NO", then may proceed with Cephalosporin use.    Social History:   Social History   Socioeconomic History   Marital status: Widowed    Spouse name: Not on file   Number of children: Not on file   Years of education: Not on file     Highest education level: Not on file  Occupational History   Not on file  Social Needs   Financial resource strain: Not on file   Food insecurity    Worry: Not on file    Inability: Not on file   Transportation needs    Medical: Not on file    Non-medical: Not on file  Tobacco Use   Smoking status: Former Smoker    Packs/day: 1.00    Years: 27.00    Pack years: 27.00    Types: Cigarettes    Quit date: 07/01/1980    Years since quitting: 38.6   Smokeless tobacco: Never Used   Tobacco comment: "quit smoking ~ 1980  Substance and Sexual Activity   Alcohol use: Not Currently    Alcohol/week: 0.0 standard drinks    Comment: 04/10/2016 "I'll have a drink on holidays/special occasions"   Drug use: No   Sexual activity: Never  Lifestyle   Physical activity    Days per week: Not on file    Minutes per session: Not on file   Stress: Not on file  Relationships   Social connections    Talks on phone: Not on file    Gets together: Not on file    Attends religious service: Not on file    Active member of club or organization: Not on file    Attends meetings of clubs or organizations: Not on file    Relationship status: Not on file   Intimate partner violence    Fear of current or ex partner: Not on file    Emotionally abused: Not on file    Physically abused: Not on file    Forced sexual activity: Not on file  Other Topics Concern   Not on file  Social History Narrative   Not on file    Family History:    Family History  Problem  Relation Age of Onset   Stroke Mother    Renal Disease Father      ROS:  Please see the history of present illness.  Review of Systems  Unable to perform ROS: Acuity of condition  Constitutional: Positive for malaise/fatigue.       Deconditioned  Cardiovascular: Negative for chest pain.  Musculoskeletal: Positive for myalgias.       S/p June 2020 fall and R hip surgery at [facility], R hip hematoma after surgery    Endo/Heme/Allergies: Bruises/bleeds easily.  All other systems reviewed and are negative.   All other ROS reviewed and negative.     Physical Exam/Data:   Vitals:   02/01/19 1952 02/02/19 0328 02/02/19 0620 02/02/19 0753  BP: 115/75 110/68  106/71  Pulse: (!) 112 (!) 110  (!) 104  Resp: 18 16  19   Temp: 98.7 F (37.1 C) 98.3 F (36.8 C)  97.7 F (36.5 C)  TempSrc: Oral Oral  Oral  SpO2: 95% 97%  99%  Weight:   53.8 kg   Height:        Intake/Output Summary (Last 24 hours) at 02/02/2019 1048 Last data filed at 02/02/2019 0510 Gross per 24 hour  Intake 3 ml  Output 650 ml  Net -647 ml   Last 3 Weights 02/02/2019 02/01/2019 01/31/2019  Weight (lbs) 118 lb 8 oz 105 lb 9.6 oz 113 lb  Weight (kg) 53.751 kg 47.9 kg 51.256 kg  Some encounter information is confidential and restricted. Go to Review Flowsheets activity to see all data.     Body mass index is 23.93 kg/m.  General: Frail and elderly female in no acute distress HEENT: normal.  On nasal cannula oxygen. Neck: JVP ~22cm. JVD difficult to assess due to frailty of patient and deconditioned status. Vascular: No carotid bruits; radial pulses 2+ bilaterally Cardiac: Tachycardic, IRIR; 2/6 systolic murmur best appreciated at L sternal border Lungs: Slightly reduced breath sounds, no wheezing, rhonchi or rales  Abd: soft, nontender, no hepatomegaly  Ext:1+ bilateral posterior femur edema with right>left and bilateral lower extremities wrapped with bandages. Wound vac placed.  Musculoskeletal:  No deformities Skin: warm and dry  Neuro:  No focal abnormalities noted Psych:  Normal affect   EKG:  The EKG was personally reviewed and demonstrates: V paced with likely underlying atrial tachycardia, 109bpm Telemetry:  Telemetry was personally reviewed and demonstrates:  Irregular, V paced 110-120bpm   Relevant CV Studies: TTE 11/05/2018  1. The left ventricle has severely reduced systolic function, with an ejection fraction of 25-30%.  The cavity size was mildly dilated. Left ventricular diastolic Doppler parameters are consistent with impaired relaxation. Left ventrical global  hypokinesis without regional wall motion abnormalities.  2. The right ventricle has mildly reduced systolic function. The cavity was moderately enlarged. There is no increase in right ventricular wall thickness. Right ventricular systolic pressure is moderately elevated with an estimated pressure of 53 mmHg.  3. Right atrial size was moderately dilated.  4. Left atrial size was moderately dilated.  5. The aortic valve was not well visualized. Moderate calcification of the aortic valve. Aortic valve regurgitation was not assessed by color flow Doppler. Unable to estimate adegree of aortic valve stenosis given severely reduced systolic function.  6. Mechanical mitral valve. Mean gradient 5 mm Hg.  7. Tricuspid valve regurgitation is moderate.  8. Left pleural effusion noted, 6.5 cm  9. Rhythm is atrial tachycardia, rate 105 bpm  Laboratory Data:  High Sensitivity Troponin:  No results  for input(s): TROPONINIHS in the last 720 hours.   Cardiac EnzymesNo results for input(s): TROPONINI in the last 168 hours. No results for input(s): TROPIPOC in the last 168 hours.  Chemistry Recent Labs  Lab 01/27/19 0446 01/31/19 0324 02/02/19 0442  NA 137 132* 137  K 4.2 3.7 3.4*  CL 92* 91* 95*  CO2 38* 33* 32  GLUCOSE 85 94 100*  BUN 25* 25* 27*  CREATININE 1.26* 1.36* 1.57*  CALCIUM 8.8* 8.7* 8.7*  GFRNONAA 40* 36* 31*  GFRAA 46* 42* 35*  ANIONGAP 7 8 10     No results for input(s): PROT, ALBUMIN, AST, ALT, ALKPHOS, BILITOT in the last 168 hours. Hematology Recent Labs  Lab 01/27/19 0446 01/28/19 0441 01/31/19 0324 01/31/19 0849 02/02/19 0442  WBC 7.4  --  7.8  --  7.8  RBC 3.22*  --  2.64*  --  2.65*  HGB 9.1* 8.4* 7.6* 8.0* 7.8*  HCT 31.4* 28.6* 25.5*  --  26.1*  MCV 97.5  --  96.6  --  98.5  MCH 28.3  --  28.8  --  29.4  MCHC 29.0*  --   29.8*  --  29.9*  RDW 18.9*  --  19.2*  --  19.8*  PLT 186  --  190  --  230   BNPNo results for input(s): BNP, PROBNP in the last 168 hours.  DDimer No results for input(s): DDIMER in the last 168 hours.   Radiology/Studies:  No results found.  Assessment and Plan:   Paroxysmal atrial fibrillation --Device interrogated today and patient back in atrial tachycardia. Confirmed by EKG/ telemetry with rates 110s-120s bpm and known baseline ~60bpm. Likely tachycardic rates worsened by and exacerbating volume retention. --We will continue amiodarone at increased dose and add back on low-dose beta-blocker. --Per EMR, she was restarted on warfarin on 7/15 per [facility] and (after heparin bridge); however, her INR was noted to have increased on 7/20 with [facility] recommendation overnight dose of warfarin held 7/20-7/21 due to jump in INR from 2 to 3 at [facility] and first dose warfarin noted 7/22 this admission.  She has remained on warfarin this admission with warfarin dosing of 1.5mg  as of today. --She does have a mitral mechanical valve with goal INR 2.5-3.5; however, this goal has been complicated by her severe anemia with need for transfusions. She is s/p multiple transfusions including 1 unit during her hospitalization at [facility] (7/18) and 1 unit at [facility] on 7/23.  --Plan to defer any cardioversion until later on this week and as patient is not an ideal candidate for cardioversion given comorbid conditions at this time.  Increased amiodarone and started low dose BB as above. Continue warfarin. Recommend that patient be anticoagulated for at least 1 month prior to cardioversion or TEE before DCCV as per guidelines.   Acute on chronic combined systolic and diastolic CHF/NICM --Patient volume overloaded, confirmed by ICD interrogation.  As above, suspect that rapid ventricular rate is worsening fluid retention, which is then in turn exacerbating Afib. --Volume status is challenging, given the frailty of the patient  and her deconditioned status with b/l lower extremities wrapped in Ace bandages. Cr worsening (currently 1.57) and suspect this is in part due to retention of fluid / cardiorenal syndrome.  --Start 1x dose of IV lasix 40mg  and reassess fluid status and kidney function.  --Repeat BNP and plan to trend in a few days for assistance with volume status. --As above, continue low-dose beta-blocker for now.  We  will continue holding losartan at this time.  Hypokalemia --K 3.4. Replete with goal 4.0.  Check magnesium.  Follow-up a.m. BMET.   Blood loss anemia s/p R hip surgery with R hip hematoma / tear --Hgb 7.8. Monitor.   Valvular heart disease s/p mechanical mitral valve --Continue Coumadin.  Hypertension -Hypotension has been limiting cardiac medications. Continue to hold losartan at this time.  Will restart low-dose beta-blocker and increase amiodarone and continue to monitor vitals.       For questions or updates, please contact [place] Please consult [email or web address] for contact info under     Signed, Arvil Chaco, PA-C  02/02/2019 10:48 AM

## 2019-02-02 NOTE — Plan of Care (Signed)
Pt still hallucinating and confused at times, HR in the 110's all night. On low rotating air mattress, foley in place, 2L O2 chronic   Problem: Nutrition: Goal: Adequate nutrition will be maintained Outcome: Progressing   Problem: Elimination: Goal: Will not experience complications related to bowel motility Outcome: Progressing Goal: Will not experience complications related to urinary retention Outcome: Progressing   Problem: Pain Managment: Goal: General experience of comfort will improve Outcome: Progressing Note: Complained of general pain once treated with tylenol with relief   Problem: Safety: Goal: Ability to remain free from injury will improve Outcome: Progressing   Problem: Skin Integrity: Goal: Risk for impaired skin integrity will decrease Outcome: Progressing

## 2019-02-02 NOTE — Progress Notes (Signed)
PT Cancellation Note  Patient Details Name: [name] MRN: [id number] DOB: 1935-01-19   Cancelled Treatment:    Reason Eval/Treat Not Completed: Other (comment): [name] daughter in room and requested no PT services this date secondary to pt's recent confusion and decreased level of alertness.  Will attempt to see pt at a future date/time as medically appropriate.    Linus Salmons PT, DPT 02/02/19, 2:44 PM

## 2019-02-02 NOTE — Progress Notes (Signed)
Patient is very sleepy but arouses to voice and answers yes/no questions. Wants to skip meds due now, but nods yes to trying again later. Will reschedule to 8pm so night shift can try again. Dr. Anselm Jungling updated.

## 2019-02-02 NOTE — Consult Note (Addendum)
Pharmacy Antibiotic Note  [name] is a 84 y.o. female admitted on 01/17/2019 with UTI.  Pharmacy has been consulted for Vancomycin dosing. Patient has a  Foley catheter and there were concerns for a UTI d/t UA. Ucx came back for ENTEROCOCCUS FAECALIS- which was pan-sensitive.   Patient was given fosfomycin 3 g x1 dose but patient was unable to take the complete dose.  Levofloxacin interacts with amiodarone causing QTc prolongation.   PCN: HIVES (severe)  Plan: Will order Vancomycin 1000 mg x1 dose. Will order 24-hour Vancomycin random level given patient's renal function. Will follow Scr with AM labs.    Thank you for allowing pharmacy to be a part of this patient's care.  Rowland Lathe 02/02/2019 4:01 PM

## 2019-02-02 NOTE — Progress Notes (Signed)
Patient ordered one time dose of 40mg  IV lasix, but BP is soft and she already received 20mg  PO lasix this morning. Spoke to Dr. Anselm Jungling, ordered to give 20mg  IV lasix once now instead. Will continue to monitor.

## 2019-02-02 NOTE — Progress Notes (Signed)
Dr. Saunders Revel aware of soft BP, output, and increasing fatigue this afternoon. MD to d/c order for lasix tonight until he can reassess her tomorrow.

## 2019-02-02 NOTE — Progress Notes (Signed)
Patient ID: [name], female   DOB: 03/26/1935, 84 y.o.   MRN: [id number]   [facility] PROGRESS NOTE  [name]:[id number] DOB: 1935-06-23 DOA: 01/25/2019 PCP: Rusty Aus, MD  HPI/Subjective:  Patient seen and evaluated today Wound VAC remains per orthopedic recommendation. Tolerating diet well No fever On oxygen via nasal cannula at 2 L More awake and responds to verbal commands, appears confused.  Objective: Vitals:   02/02/19 1110 02/02/19 1357  BP: 104/69 99/67  Pulse:  (!) 105  Resp:    Temp:    SpO2:      Intake/Output Summary (Last 24 hours) at 02/02/2019 1550 Last data filed at 02/02/2019 1455 Gross per 24 hour  Intake 3 ml  Output 850 ml  Net -847 ml   Filed Weights   01/31/19 0900 02/01/19 0518 02/02/19 0620  Weight: 51.3 kg 47.9 kg 53.8 kg    ROS: Review of Systems  Constitutional: Negative for chills and fever.  Eyes: Negative for blurred vision.  Respiratory: Negative for cough and shortness of breath.   Cardiovascular: Negative for chest pain.  Gastrointestinal: Negative for abdominal pain, diarrhea, nausea and vomiting.  Genitourinary: Negative for dysuria.  Musculoskeletal: Positive for joint pain.  Neurological: Negative for dizziness and headaches.   Exam: Physical Exam  HENT:  Nose: No mucosal edema.  Mouth/Throat: No oropharyngeal exudate or posterior oropharyngeal edema.  Eyes: Pupils are equal, round, and reactive to light. Conjunctivae, EOM and lids are normal.  Neck: No JVD present. Carotid bruit is not present. No edema present. No thyroid mass and no thyromegaly present.  Cardiovascular: S1 normal and S2 normal.  Murmur heard.  Systolic murmur is present with a grade of 3/6. tachycardia  Respiratory: No respiratory distress. She has decreased breath sounds in the right lower field and the left lower field. She has no wheezes. She has no rhonchi. She has no rales.  GI: Soft. Bowel sounds are normal. There  is no abdominal tenderness.  Musculoskeletal:     Right ankle: She exhibits swelling.     Left ankle: She exhibits swelling.  Lymphadenopathy:    She has no cervical adenopathy.  Neurological: She is alert. No cranial nerve deficit.  Generalized weakness, moves both upper limbs - power 3/5, both lower limbs 2-3/5  Skin: Skin is warm. Nails show no clubbing.  Psychiatric: She has a normal mood and affect.   Foley catheter in place, skin lesions on leg and sacrum.  Data Reviewed: Basic Metabolic Panel: Recent Labs  Lab 01/27/19 0446 01/31/19 0324 02/02/19 0442  NA 137 132* 137  K 4.2 3.7 3.4*  CL 92* 91* 95*  CO2 38* 33* 32  GLUCOSE 85 94 100*  BUN 25* 25* 27*  CREATININE 1.26* 1.36* 1.57*  CALCIUM 8.8* 8.7* 8.7*  CBC: Recent Labs  Lab 01/27/19 0446 01/28/19 0441 01/31/19 0324 01/31/19 0849 02/02/19 0442  WBC 7.4  --  7.8  --  7.8  HGB 9.1* 8.4* 7.6* 8.0* 7.8*  HCT 31.4* 28.6* 25.5*  --  26.1*  MCV 97.5  --  96.6  --  98.5  PLT 186  --  190  --  230   BNP (last 3 results) Recent Labs    12/04/18 1627 12/04/18 1954 02/02/19 1047  BNP 1,189.0* 992.0* 1,572.0*     Recent Results (from the past 240 hour(s))  Urine Culture     Status: Abnormal   Collection Time: 01/31/19  2:18 PM   Specimen: Urine,  Random  Result Value Ref Range Status   Specimen Description   Final    URINE, RANDOM Performed at [facility], [address] [place], [place] [address]    Special Requests   Final    NONE Performed at [facility], [address] [place], [place] [address]    Culture >=100,000 COLONIES/mL ENTEROCOCCUS FAECALIS (A)  Final   Report Status 02/02/2019 FINAL  Final   Organism ID, Bacteria ENTEROCOCCUS FAECALIS (A)  Final      Susceptibility   Enterococcus faecalis - MIC*    AMPICILLIN <=2 SENSITIVE Sensitive     LEVOFLOXACIN 0.5 SENSITIVE Sensitive     NITROFURANTOIN <=16 SENSITIVE Sensitive     VANCOMYCIN 1 SENSITIVE Sensitive     *  >=100,000 COLONIES/mL ENTEROCOCCUS FAECALIS     Studies: No results found.  Scheduled Meds: . acetaminophen  650 mg Oral Once  . amiodarone  200 mg Oral BID  . collagenase   Topical Daily  . docusate sodium  100 mg Oral BID  . furosemide  20 mg Oral BID  . mouth rinse  15 mL Mouth Rinse BID  . [START ON 02/03/2019] metoprolol succinate  12.5 mg Oral Daily  . multivitamin-lutein  1 capsule Oral Daily  . pantoprazole  40 mg Oral Daily  . sodium chloride flush  3 mL Intravenous Q12H  . vancomycin variable dose per unstable renal function (pharmacist dosing)   Does not apply See admin instructions  . vitamin C  500 mg Oral Daily  . warfarin  1.5 mg Oral ONCE-1800  . Warfarin - Pharmacist Dosing Inpatient   Does not apply q1800   Continuous Infusions: . vancomycin 1,000 mg (02/02/19 1452)    Assessment/Plan:  1. Acute on chronic combined systolic and diastolic heart failure. Continue oral Lasix patient stable       As per cardiologist- seems to have some volume overload- give one dose IV lasix. 2. Paroxysmal atrial fibrillation and history of mitral valve replacement on amiodarone and Coumadin- Pharmacy to manage dose for therapeutic INR.     Pt have some atreal tachycardia- cardiologist contacted again to help manage and adjust medication doses. 3. Acute blood loss anemia secondary to large right hip hematoma and skin tear right leg.  Also has other hematomas on the legs. Hemoglobin stable. Monitor. 4. Hip fracture status post operative repair at [facility].  Physical therapy recommending rehab.  Family wants patient to go home once clinically more stable and has enough endurance and strength.  Do not want SNF placement. 5. Weakness. Status post physical therapy evaluation-suggested rehab placement but family does not want her to go to rehab and would like to take her home once she is able to transfer from bed to commode or wheelchair.      As patient had long hospital stays and has  significant weakness, she is still requiring 2+ assist stand up and it does not seem possible in the near future that she might be able to have that much improvement and to move without significant support. I discussed with daughter that whenever she goes home, she will need likely 24X7 care and personal aid. 6. Chronic kidney disease stage III.  Watch with diuresis, slightly worse- cardiologist feels it is due to some cardiorenal component, give lasix one dose and they are working on achieving better HR control. 7. sacral decubiti.  local wound care.  Frequent changing positions 8. Large right hip hematoma.  Needs to be on Coumadin  secondary to metallic mitral valve.  Continue to monitor.  Patient has been followed by orthopedics here .  Wound VAC placed.F/U with ortho regarding wound vac continuity as drainage has decreased. 9. Skin tear right lower extremity.  Supportive care.  Daily wound dressings and wound care team follow-up.Wound care dressings every alternate day. 10.  Altered mental status- metabolic encephalopathy vs hospital delirium      UA was with some WBCs with chronic catheter. Ur cx have E. Fecalis- will give Abx.       She have PCN allergy, can not give levaquin due to amiodarone and chance of QT prolongation. We tried oral fosfomycin and she did not take full dose disolved in warter, spoke to pharmacist to give IV vancomycin - will give total 7 days course.   Code Status: DNR    Code Status Orders  (From admission, onward)         Start     Ordered   01/19/19 0004  Do not attempt resuscitation (DNR)  Continuous    Question Answer Comment  In the event of cardiac or respiratory ARREST Do not call a "code blue"   In the event of cardiac or respiratory ARREST Do not perform Intubation, CPR, defibrillation or ACLS   In the event of cardiac or respiratory ARREST Use medication by any route, position, wound care, and other measures to relive pain and suffering. May use oxygen,  suction and manual treatment of airway obstruction as needed for comfort.      01/19/19 0003        Code Status History    Date Active Date Inactive Code Status Order ID Comments User Context   01/07/2019 2311 01/19/2019 0003 Full Code [id number]  Mayer Camel, NP Inpatient   01/06/2019 2309 01/15/2019 2311 Full Code [id number]  Mayer Camel, NP Inpatient   12/30/2018 1220 01/06/2019 0608 DNR [id number]  Ottie Glazier, MD Inpatient   12/26/2018 0147 12/30/2018 1220 Full Code [id number]  Lance Coon, MD Inpatient   12/04/2018 1937 12/15/2018 1643 Full Code [id number]  Mayer Camel, NP Inpatient   11/24/2018 1621 11/30/2018 2027 Full Code [id number]  Fritzi Mandes, MD Inpatient   11/24/2018 1557 11/24/2018 1620 DNR [id number]  Fritzi Mandes, MD Inpatient   11/09/2018 0333 11/24/2018 1557 Full Code [id number]  Mayer Camel, NP ED   11/04/2018 1650 11/08/2018 2006 Full Code [id number]  Saundra Shelling, MD Inpatient   04/10/2016 2034 04/19/2016 1757 Full Code [id number]  Vianne Bulls, MD Inpatient   09/29/2015 1053 10/10/2015 1406 Full Code [id number]  [name], MD Inpatient   04/28/2015 1626 04/30/2015 1422 Full Code [id number]  Demetrios Loll, MD Inpatient   Advance Care Planning Activity    Advance Directive Documentation     Most Recent Value  Type of Advance Directive  Out of facility DNR (pink MOST or yellow form)  Pre-existing out of facility DNR order (yellow form or pink MOST form)  Yellow form placed in chart (order not valid for inpatient use)  "MOST" Form in Place?  -     Family Communication: Family updated- spoke to her daughter in room today.    Later spoke to son on phone also.  Disposition Plan: To be determined based on clinical course  Time spent: 34 minutes  [employer]

## 2019-02-02 NOTE — Consult Note (Addendum)
[place] for Warfarin Dosing  Indication: Mitral Valve Replacement, A. Fib    Allergies  Allergen Reactions  . Ace Inhibitors     Cough  . Doxycycline Nausea Only  . Hydrocodone Itching and Nausea Only  . Mercury     Other reaction(s): Unknown  . Silver Dermatitis    Severe itching  . Cephalexin Rash  . Clindamycin/Lincomycin Rash  . Nickel Rash  . Penicillins Rash    Has patient had a PCN reaction causing immediate rash, facial/tongue/throat swelling, SOB or lightheadedness with hypotension: YES Has patient had a PCN reaction causing severe rash involving mucus membranes or skin necrosis: NO Has patient had a PCN reaction that required hospitalization NO Has patient had a PCN reaction occurring within the last 10 years: NO If all of the above answers are "NO", then may proceed with Cephalosporin use.    Patient Measurements: Height: 4\' 11"  (149.9 cm) Weight: 118 lb 8 oz (53.8 kg) IBW/kg (Calculated) : 43.2   Vital Signs: Temp: 97.7 F (36.5 C) (08/04 0753) Temp Source: Oral (08/04 0753) BP: 106/71 (08/04 0753) Pulse Rate: 104 (08/04 0753)  Labs: Recent Labs    01/31/19 0324 01/31/19 0849 02/01/19 0410 02/02/19 0442  HGB 7.6* 8.0*  --  7.8*  HCT 25.5*  --   --  26.1*  PLT 190  --   --  230  LABPROT 33.2*  --  27.9* 26.2*  INR 3.3*  --  2.7* 2.5*  CREATININE 1.36*  --   --  1.57*    Estimated Creatinine Clearance: 21 mL/min (A) (by C-G formula based on SCr of 1.57 mg/dL (H)).   Medical History: Past Medical History:  Diagnosis Date  . Arthritis    "back, hands, knees" (04/10/2016)  . Chronic back pain   . 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 EF 25% by echo in 08/2015 b. RHC in 08/2015 showed normal filling pressures; c. 10/2018 Echo (in setting of atrial tachycardia): EF 25-30%, impaired relaxation. Glob HK. RVSP 47mmHg. Mod dil RA/LA. Mech MV w/ nl fxn (mean grad  30mmHg). Mod TR.   Marland Kitchen Chronic kidney disease   . Compression fracture    "several; all in my back" (04/10/2016)  . GERD (gastroesophageal reflux disease)   . Hypertension   . Lymphedema   . Pacemaker 05/2017  . PAF (paroxysmal atrial fibrillation) ([place])    a. On amio & Coumadin - CHA2DS2VASc = 4.  . S/P MVR (mitral valve replacement)    a. MVR 1994 b. redo MVR in 08/2014 - on Coumadin  . Shortness of breath dyspnea    Assessment: Pharmacy consulted for warfarin dosing and monitoring for 84 yo female with PMH of mitral valve replacement and A. Fib. Patient transferred to [facility] 7/20 PM from [place]. Patient's INR is sensitive to changes. We have been giving her a lower dose from her home dose. Patient may need to be discharged on 2 mg daily.   Home dose: warfarin 2.5 mg daily.   DDI: on amio (increase INR)   Spoke to MD on 7/22 regarding low Hgb- okay with continuing warfarin at lower dose.  Today's INR is therapeutic, s/p held dose and dose reduction x2.   DATE INR DOSE 7/23 3.0 1 mg - did not receive.  7/24     2.7       2 mg  7/25 2.4 2 mg 7/26 2.2 2.5 mg 7/27  1.9       4 mg 7/28     1.9       3 mg  7/29 2.3 2.5 mg 7/30     2.9       2 mg 7/31     3.1       2 mg  8/1       4.0       Hold  8/2       3.3       1 mg 8/3 2.7 1 mg 8/4 2.5  Goal of Therapy:  INR 2.5-3.5 (ideally want INR between 2.5 to 3 per previous notes) Monitor platelets by anticoagulation protocol: Yes   Plan:  INR level is therapeutic. Will give warfarin 1.5 mg tonight x 1 dose.  Daily INR ordered. Hgb trending down. Continue to closely monitor CBC.   Pharmacy will continue to follow and order warfarin based on INRs  Rowland Lathe, PharmD 02/02/2019 8:15 AM

## 2019-02-03 ENCOUNTER — Telehealth: Payer: Self-pay | Admitting: Cardiovascular Disease

## 2019-02-03 ENCOUNTER — Ambulatory Visit: Payer: Medicare Other | Admitting: Family

## 2019-02-03 DIAGNOSIS — I480 Paroxysmal atrial fibrillation: Secondary | ICD-10-CM

## 2019-02-03 DIAGNOSIS — I5022 Chronic systolic (congestive) heart failure: Secondary | ICD-10-CM

## 2019-02-03 LAB — BASIC METABOLIC PANEL
Anion gap: 9 (ref 5–15)
BUN: 28 mg/dL — ABNORMAL HIGH (ref 8–23)
CO2: 32 mmol/L (ref 22–32)
Calcium: 8.6 mg/dL — ABNORMAL LOW (ref 8.9–10.3)
Chloride: 97 mmol/L — ABNORMAL LOW (ref 98–111)
Creatinine, Ser: 1.48 mg/dL — ABNORMAL HIGH (ref 0.44–1.00)
GFR calc Af Amer: 38 mL/min — ABNORMAL LOW (ref >60)
GFR calc non Af Amer: 33 mL/min — ABNORMAL LOW (ref >60)
Glucose, Bld: 84 mg/dL (ref 70–99)
Potassium: 3.3 mmol/L — ABNORMAL LOW (ref 3.5–5.1)
Sodium: 138 mmol/L (ref 135–145)

## 2019-02-03 LAB — VANCOMYCIN, RANDOM: Vancomycin Rm: 12

## 2019-02-03 LAB — PROTIME-INR
INR: 2.2 — ABNORMAL HIGH (ref 0.8–1.2)
Prothrombin Time: 24 seconds — ABNORMAL HIGH (ref 11.4–15.2)

## 2019-02-03 MED ORDER — VANCOMYCIN HCL IN DEXTROSE 1-5 GM/200ML-% IV SOLN
1000.0000 mg | Freq: Once | INTRAVENOUS | Status: AC
  Administered 2019-02-03: 1000 mg via INTRAVENOUS
  Filled 2019-02-03: qty 200

## 2019-02-03 MED ORDER — FUROSEMIDE 10 MG/ML IJ SOLN
40.0000 mg | Freq: Once | INTRAMUSCULAR | Status: AC
  Administered 2019-02-03: 40 mg via INTRAVENOUS
  Filled 2019-02-03: qty 4

## 2019-02-03 MED ORDER — WARFARIN SODIUM 1 MG PO TABS
1.5000 mg | ORAL_TABLET | Freq: Once | ORAL | Status: AC
  Administered 2019-02-03: 1.5 mg via ORAL
  Filled 2019-02-03: qty 1

## 2019-02-03 MED ORDER — POTASSIUM CHLORIDE CRYS ER 20 MEQ PO TBCR
40.0000 meq | EXTENDED_RELEASE_TABLET | Freq: Once | ORAL | Status: AC
  Administered 2019-02-03: 40 meq via ORAL
  Filled 2019-02-03: qty 2

## 2019-02-03 MED ORDER — CARVEDILOL 3.125 MG PO TABS: 3.1250 mg | ORAL_TABLET | Freq: Two times a day (BID) | ORAL | Status: DC

## 2019-02-03 MED ORDER — MEGESTROL ACETATE 400 MG/10ML PO SUSP
200.0000 mg | Freq: Two times a day (BID) | ORAL | Status: DC
  Filled 2019-02-03 (×5): qty 10

## 2019-02-03 NOTE — Consult Note (Signed)
WOC consulted for wound care for the coccyx. Spoke with bedside nurse, orders were just updated 02/02/19 for the wound. Bedside nurse is very familiar with this patient and reports all the wounds are doing well.  Dr. Marla Roe has followed this patient for her LE wounds. She is followed by ortho for the hip wound.  The bedside nurse reports the coccyx is unchanged and continues to need enzymatic debridement ointment.   No need for any further updates at this time.   Patient is presently on low air loss mattress for moisture management and pressure redistribution.  She is being follow by RD for recommendations for supplementation for wound healing. Patient is vitamin supplementation appropriate for wound healing. Patient has FC in place for management of urine in the presence of a pressure injury. Patient is not incontinent of bowels.   Discussed POC with bedside nurse.  Re consult if needed, will not follow at this time. Thanks  Lariza Cothron [employer], RN,CWOCN, CNS, [place] [phone])

## 2019-02-03 NOTE — Progress Notes (Signed)
   Subjective:     POD# 23 Patient remains very sleepy  Patient is well, and has had no acute complaints or problems Slow progression with therapy Plan is to go Rehab after hospital stay. no nausea and no vomiting Patient denies any chest pains or shortness of breath. Objective: Vital signs in last 24 hours: Temp:  [97.4 F (36.3 C)-98.3 F (36.8 C)] 97.6 F (36.4 C) (08/05 0511) Pulse Rate:  [104-108] 105 (08/05 0511) Resp:  [16-19] 16 (08/04 2040) BP: (92-104)/(61-69) 100/69 (08/05 0511) SpO2:  [95 %-100 %] 100 % (08/05 0511) well approximated incision Heels are non tender and elevated off the bed using rolled towels Intake/Output from previous day: 08/04 0701 - 08/05 0700 In: 191.3 [IV Piggyback:191.3] Out: 550 [Urine:550] Intake/Output this shift: No intake/output data recorded.  Recent Labs    01/31/19 0849 02/02/19 0442  HGB 8.0* 7.8*   Recent Labs    02/02/19 0442  WBC 7.8  RBC 2.65*  HCT 26.1*  PLT 230   Recent Labs    02/02/19 0442 02/03/19 0446  NA 137 138  K 3.4* 3.3*  CL 95* 97*  CO2 32 32  BUN 27* 28*  CREATININE 1.57* 1.48*  GLUCOSE 100* 84  CALCIUM 8.7* 8.6*   Recent Labs    02/02/19 0442 02/03/19 0446  INR 2.5* 2.2*    EXAM General - Patient is very sleepy and hard to keep awake to communicate with Extremity - Neurologically intact Neurovascular intact Intact pulses distally Dorsiflexion/Plantar flexion intact No cellulitis present right thigh appears to be much softer Dressing - distal dressing shows dark dry blood and is unchanged from yesterday. wound vac to proximal incision and appears to be slowing down. Motor Function - intact, moving foot and toes well on exam.    Past Medical History:  Diagnosis Date  . Arthritis    "back, hands, knees" (04/10/2016)  . Chronic back pain   . 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 EF 25% by echo in 08/2015 b. RHC in 08/2015  showed normal filling pressures; c. 10/2018 Echo (in setting of atrial tachycardia): EF 25-30%, impaired relaxation. Glob HK. RVSP 67mmHg. Mod dil RA/LA. Mech MV w/ nl fxn (mean grad 42mmHg). Mod TR.   Marland Kitchen Chronic kidney disease   . Compression fracture    "several; all in my back" (04/10/2016)  . GERD (gastroesophageal reflux disease)   . Hypertension   . Lymphedema   . Pacemaker 05/2017  . PAF (paroxysmal atrial fibrillation) ([place])    a. On amio & Coumadin - CHA2DS2VASc = 4.  . S/P MVR (mitral valve replacement)    a. MVR 1994 b. redo MVR in 08/2014 - on Coumadin  . Shortness of breath dyspnea     Assessment/Plan:    Active Problems:   Hip fracture requiring operative repair ([place])  Estimated body mass index is 23.93 kg/m as calculated from the following:   Height as of this encounter: 4\' 11"  (1.499 m).   Weight as of this encounter: 53.8 kg. Up with therapy Discharge to SNF  Labs: K+3.3 DVT Prophylaxis - Coumadin Weight-Bearing as tolerated to right leg D/C O2 and Pulse OX and try on Room Air Dressing changed to distal incision. Probably d/c wound vac this week and remove sutures   R. [place] 02/03/2019, 7:56 AM

## 2019-02-03 NOTE — Progress Notes (Signed)
Patient ID: [name], female   DOB: 04-Jun-1935, 84 y.o.   MRN: [id number]   [employer] PROGRESS NOTE  [name]:[id number] DOB: [birth year] DOA: 01/29/2019 PCP: Rusty Aus, MD  HPI/Subjective:  Patient seen and evaluated today Wound VAC remains per orthopedic recommendation. Tolerating diet well No fever On oxygen via nasal cannula at 2 L she was awake in morning as per daughter- had breakfast but then went to sleep when I went to see her around 11;30 am. Easily arousable.  Objective: Vitals:   02/03/19 0511 02/03/19 0757  BP: 100/69 93/77  Pulse: (!) 105 (!) 103  Resp:  19  Temp: 97.6 F (36.4 C) (!) 97.5 F (36.4 C)  SpO2: 100% 99%    Intake/Output Summary (Last 24 hours) at 02/03/2019 1617 Last data filed at 02/03/2019 1409 Gross per 24 hour  Intake -  Output 700 ml  Net -700 ml   Filed Weights   01/31/19 0900 02/01/19 0518 02/02/19 0620  Weight: 51.3 kg 47.9 kg 53.8 kg    ROS: Review of Systems  Constitutional: Negative for chills and fever.  Eyes: Negative for blurred vision.  Respiratory: Negative for cough and shortness of breath.   Cardiovascular: Negative for chest pain.  Gastrointestinal: Negative for abdominal pain, diarrhea, nausea and vomiting.  Genitourinary: Negative for dysuria.  Musculoskeletal: Positive for joint pain.  Neurological: Negative for dizziness and headaches.   Exam: Physical Exam  HENT:  Nose: No mucosal edema.  Mouth/Throat: No oropharyngeal exudate or posterior oropharyngeal edema.  Eyes: Pupils are equal, round, and reactive to light. Conjunctivae, EOM and lids are normal.  Neck: No JVD present. Carotid bruit is not present. No edema present. No thyroid mass and no thyromegaly present.  Cardiovascular: S1 normal and S2 normal.  Murmur heard.  Systolic murmur is present with a grade of 3/6. tachycardia  Respiratory: No respiratory distress. She has decreased breath sounds in the right lower field  and the left lower field. She has no wheezes. She has no rhonchi. She has no rales.  GI: Soft. Bowel sounds are normal. There is no abdominal tenderness.  Musculoskeletal:     Right ankle: She exhibits swelling.     Left ankle: She exhibits swelling.  Lymphadenopathy:    She has no cervical adenopathy.  Neurological: She is alert. No cranial nerve deficit.  Generalized weakness, moves both upper limbs - power 3/5, both lower limbs 2-3/5  Skin: Skin is warm. Nails show no clubbing.  Psychiatric: She has a normal mood and affect.   Foley catheter in place, skin lesions on leg and sacrum.  Data Reviewed: Basic Metabolic Panel: Recent Labs  Lab 01/31/19 0324 02/02/19 0442 02/03/19 0446  NA 132* 137 138  K 3.7 3.4* 3.3*  CL 91* 95* 97*  CO2 33* 32 32  GLUCOSE 94 100* 84  BUN 25* 27* 28*  CREATININE 1.36* 1.57* 1.48*  CALCIUM 8.7* 8.7* 8.6*  CBC: Recent Labs  Lab 01/28/19 0441 01/31/19 0324 01/31/19 0849 02/02/19 0442  WBC  --  7.8  --  7.8  HGB 8.4* 7.6* 8.0* 7.8*  HCT 28.6* 25.5*  --  26.1*  MCV  --  96.6  --  98.5  PLT  --  190  --  230   BNP (last 3 results) Recent Labs    12/04/18 1627 12/04/18 1954 02/02/19 1047  BNP 1,189.0* 992.0* 1,572.0*     Recent Results (from the past 240 hour(s))  Urine Culture  Status: Abnormal   Collection Time: 01/31/19  2:18 PM   Specimen: Urine, Random  Result Value Ref Range Status   Specimen Description   Final    URINE, RANDOM Performed at [facility], [address], [place], [place] [address]    Special Requests   Final    NONE Performed at [facility], [address] [place], [place] [address]    Culture >=100,000 COLONIES/mL ENTEROCOCCUS FAECALIS (A)  Final   Report Status 02/02/2019 FINAL  Final   Organism ID, Bacteria ENTEROCOCCUS FAECALIS (A)  Final      Susceptibility   Enterococcus faecalis - MIC*    AMPICILLIN <=2 SENSITIVE Sensitive     LEVOFLOXACIN 0.5 SENSITIVE Sensitive      NITROFURANTOIN <=16 SENSITIVE Sensitive     VANCOMYCIN 1 SENSITIVE Sensitive     * >=100,000 COLONIES/mL ENTEROCOCCUS FAECALIS     Studies: No results found.  Scheduled Meds: . acetaminophen  650 mg Oral Once  . amiodarone  200 mg Oral BID  . [START ON 02/04/2019] carvedilol  3.125 mg Oral BID WC  . collagenase   Topical Daily  . docusate sodium  100 mg Oral BID  . mouth rinse  15 mL Mouth Rinse BID  . megestrol  200 mg Oral BID  . multivitamin-lutein  1 capsule Oral Daily  . pantoprazole  40 mg Oral Daily  . sodium chloride flush  3 mL Intravenous Q12H  . vancomycin variable dose per unstable renal function (pharmacist dosing)   Does not apply See admin instructions  . vitamin C  500 mg Oral Daily  . warfarin  1.5 mg Oral ONCE-1800  . Warfarin - Pharmacist Dosing Inpatient   Does not apply q1800   Continuous Infusions:   Assessment/Plan:  1. Acute on chronic combined systolic and diastolic heart failure. Continue oral Lasix patient stable       As per cardiologist- seems to have some volume overload- give one dose IV lasix again today. 2. Paroxysmal atrial fibrillation and history of mitral valve replacement on amiodarone and Coumadin- Pharmacy to manage dose for therapeutic INR.     Pt have some atreal tachycardia- cardiologist contacted again to help manage and adjust medication doses.  Increased dose of amiodarone. Added Coreg by cardiology. 3. Acute blood loss anemia secondary to large right hip hematoma and skin tear right leg.  Also has other hematomas on the legs. Hemoglobin stable. Monitor. 4. Hip fracture status post operative repair at [facility].  Physical therapy recommending rehab.  Family wants patient to go home once clinically more stable and has enough endurance and strength.  Do not want SNF placement. 5. Weakness. Status post physical therapy evaluation-suggested rehab placement but family does not want her to go to rehab and would like to take her home once she is  able to transfer from bed to commode or wheelchair.      As patient had long hospital stays and has significant weakness, she is still requiring 2+ assist stand up and it does not seem possible in the near future that she might be able to have that much improvement and to move without significant support. I discussed with daughter that whenever she goes home, she will need likely 24X7 care and personal aid. 6. Chronic kidney disease stage III.  Watch with diuresis, slightly worse- cardiologist feels it is due to some cardiorenal component, give lasix one dose and they are working on achieving better HR control. 7. sacral decubiti.  consult wound care.  Frequent changing positions 8. Large right hip hematoma.  Needs to be on Coumadin secondary to metallic mitral valve.  Continue to monitor.  Patient has been followed by orthopedics here .  Wound VAC placed.F/U with ortho regarding wound vac continuity as drainage has decreased. 9. Skin tear right lower extremity.  Supportive care.  Daily wound dressings and wound care team follow-up.Wound care dressings every alternate day. 10.  Altered mental status- metabolic encephalopathy vs hospital delirium      UA was with some WBCs with chronic catheter. Ur cx have E. Fecalis- will give Abx.       She have PCN allergy, can not give levaquin due to amiodarone and chance of QT prolongation. We tried oral fosfomycin and she did not take full dose disolved in warter, spoke to pharmacist to give IV vancomycin - will give total 7 days course. 11. Decreased oral intake- as per dietician started on megestrol.  Code Status: DNR    Code Status Orders  (From admission, onward)         Start     Ordered   01/19/19 0004  Do not attempt resuscitation (DNR)  Continuous    Question Answer Comment  In the event of cardiac or respiratory ARREST Do not call a "code blue"   In the event of cardiac or respiratory ARREST Do not perform Intubation, CPR, defibrillation or ACLS    In the event of cardiac or respiratory ARREST Use medication by any route, position, wound care, and other measures to relive pain and suffering. May use oxygen, suction and manual treatment of airway obstruction as needed for comfort.      01/19/19 0003        Code Status History    Date Active Date Inactive Code Status Order ID Comments User Context   01/17/2019 2311 01/19/2019 0003 Full Code [id number]  Mayer Camel, NP Inpatient   01/13/2019 2309 01/04/2019 2311 Full Code [id number]  Mayer Camel, NP Inpatient   12/30/2018 1220 01/06/2019 0608 DNR [id number]  Ottie Glazier, MD Inpatient   12/26/2018 0147 12/30/2018 1220 Full Code [id number]  Lance Coon, MD Inpatient   12/04/2018 1937 12/15/2018 1643 Full Code [id number]  Mayer Camel, NP Inpatient   11/24/2018 1621 11/30/2018 2027 Full Code [id number]  Fritzi Mandes, MD Inpatient   11/24/2018 1557 11/24/2018 1620 DNR [id number]  Fritzi Mandes, MD Inpatient   11/09/2018 0333 11/24/2018 1557 Full Code [id number]  Mayer Camel, NP ED   11/04/2018 1650 11/08/2018 2006 Full Code [id number]  Saundra Shelling, MD Inpatient   04/10/2016 2034 04/19/2016 1757 Full Code [id number]  Vianne Bulls, MD Inpatient   09/29/2015 1053 10/10/2015 1406 Full Code [id number]  [name], MD Inpatient   04/28/2015 1626 04/30/2015 1422 Full Code [id number]  Demetrios Loll, MD Inpatient   Advance Care Planning Activity    Advance Directive Documentation     Most Recent Value  Type of Advance Directive  Out of facility DNR (pink MOST or yellow form)  Pre-existing out of facility DNR order (yellow form or pink MOST form)  Yellow form placed in chart (order not valid for inpatient use)  "MOST" Form in Place?  -     Family Communication: Family updated- spoke to her daughter in room today.   Disposition Plan: To be determined based on clinical course  Time spent: 34 minutes  [employer]

## 2019-02-03 NOTE — Progress Notes (Signed)
Progress Note  Patient Name: [name] Date of Encounter: 02/03/2019  Primary Cardiologist: Ida Rogue, MD   Subjective   No chest pain or cardiac complaints this morning.  She stated that she was able to move her right leg with physical therapy yesterday.  She reported some urine output.  Inpatient Medications    Scheduled Meds: . acetaminophen  650 mg Oral Once  . amiodarone  200 mg Oral BID  . collagenase   Topical Daily  . docusate sodium  100 mg Oral BID  . mouth rinse  15 mL Mouth Rinse BID  . metoprolol succinate  12.5 mg Oral Daily  . multivitamin-lutein  1 capsule Oral Daily  . pantoprazole  40 mg Oral Daily  . sodium chloride flush  3 mL Intravenous Q12H  . vancomycin variable dose per unstable renal function (pharmacist dosing)   Does not apply See admin instructions  . vitamin C  500 mg Oral Daily  . warfarin  1.5 mg Oral ONCE-1800  . Warfarin - Pharmacist Dosing Inpatient   Does not apply q1800   Continuous Infusions:  PRN Meds: acetaminophen, acetaminophen, calcium carbonate, ipratropium-albuterol, liver oil-zinc oxide, Melatonin, menthol-cetylpyridinium **OR** phenol, metoCLOPramide **OR** metoCLOPramide (REGLAN) injection, nystatin, ondansetron **OR** ondansetron (ZOFRAN) IV, oxyCODONE   Vital Signs    Vitals:   02/02/19 2000 02/02/19 2040 02/03/19 0511 02/03/19 0757  BP:  99/64 100/69 93/77  Pulse:  (!) 104 (!) 105 (!) 103  Resp:  16  19  Temp:  (!) 97.4 F (36.3 C) 97.6 F (36.4 C) (!) 97.5 F (36.4 C)  TempSrc:  Oral Oral Oral  SpO2: 95% 99% 100% 99%  Weight:      Height:        Intake/Output Summary (Last 24 hours) at 02/03/2019 0835 Last data filed at 02/03/2019 0511 Gross per 24 hour  Intake 191.34 ml  Output 550 ml  Net -358.66 ml   Last 3 Weights 02/03/2019 02/02/2019 02/01/2019  Weight (lbs) (No Data) 118 lb 8 oz 105 lb 9.6 oz  Weight (kg) (No Data) 53.751 kg 47.9 kg  Some encounter information is confidential and restricted.  Go to Review Flowsheets activity to see all data.      Telemetry    V paced, 102 110 bpm- Personally Reviewed  ECG    No new tracings- Personally Reviewed  Physical Exam   GEN:  Frail and elderly female.  Somnolent, having just woken up for the morning. Neck:  JVP at least moderately elevated Cardiac:  Tachycardic but regular, 1/6 systolic murmur. No rubs, or gallops.  Respiratory: Poor inspiratory effort with bilaterally diminished breath sounds GI: Soft, nontender, non-distended  MS: 1+ bilateral pitting edema of thighs, b/l lower extremities wrapped in bandages; No deformity. Neuro:  Nonfocal  Psych: Normal affect   Labs    High Sensitivity Troponin:  No results for input(s): TROPONINIHS in the last 720 hours.    Cardiac EnzymesNo results for input(s): TROPONINI in the last 168 hours. No results for input(s): TROPIPOC in the last 168 hours.   Chemistry Recent Labs  Lab 01/31/19 0324 02/02/19 0442 02/03/19 0446  NA 132* 137 138  K 3.7 3.4* 3.3*  CL 91* 95* 97*  CO2 33* 32 32  GLUCOSE 94 100* 84  BUN 25* 27* 28*  CREATININE 1.36* 1.57* 1.48*  CALCIUM 8.7* 8.7* 8.6*  GFRNONAA 36* 31* 33*  GFRAA 42* 35* 38*  ANIONGAP 8 10 9      Hematology Recent Labs  Lab 01/28/19 0441 01/31/19 0324 01/31/19 0849 02/02/19 0442  WBC  --  7.8  --  7.8  RBC  --  2.64*  --  2.65*  HGB 8.4* 7.6* 8.0* 7.8*  HCT 28.6* 25.5*  --  26.1*  MCV  --  96.6  --  98.5  MCH  --  28.8  --  29.4  MCHC  --  29.8*  --  29.9*  RDW  --  19.2*  --  19.8*  PLT  --  190  --  230    BNP Recent Labs  Lab 02/02/19 1047  BNP 1,572.0*     DDimer No results for input(s): DDIMER in the last 168 hours.   Radiology    No results found.  Cardiac Studies   TTE 11/05/2018 1. The left ventricle has severely reduced systolic function, with an ejection fraction of 25-30%. The cavity size was mildly dilated. Left ventricular diastolic Doppler parameters are consistent with impaired relaxation.  Left ventrical global  hypokinesis without regional wall motion abnormalities. 2. The right ventricle has mildly reduced systolic function. The cavity was moderately enlarged. There is no increase in right ventricular wall thickness. Right ventricular systolic pressure is moderately elevated with an estimated pressure of 53 mmHg. 3. Right atrial size was moderately dilated. 4. Left atrial size was moderately dilated. 5. The aortic valve was not well visualized. Moderate calcification of the aortic valve. Aortic valve regurgitation was not assessed by color flow Doppler. Unable to estimate adegree of aortic valve stenosis given severely reduced systolic function. 6. Mechanical mitral valve. Mean gradient 5 mm Hg. 7. Tricuspid valve regurgitation is moderate. 8. Left pleural effusion noted, 6.5 cm 9. Rhythm is atrial tachycardia, rate 105 bpm  Patient Profile     84 y.o. female with a hx of atrial fibrillation, chronic combined systolic and diastolic congestive heart failure, nonischemic cardiomyopathy status post CRT-P (11/2017), left bundle branch block, hypertension, chronic lower extremity edema, mitral valve disease status post mechanical mitral valve replacement on chronic Coumadin, s/p spontaneous left calf hematoma requiring debridement, stage III chronic kidney disease, 11/2018 mechanical fall with subsequent R hip fracture and later R hip surgery ([facility]), and seen for management and optimization of cardiac medications in the setting of the above complex medical and cardiac conditions and s/p right-sided intertrochanteric femur fracture and repair at [name].  Assessment & Plan    Paroxysmal atrial fibrillation / Atrial Tachycardia --Device interrogated 8/4, showing atrial tachycardia. Rates 100s-110s with known baseline ~60bpm.   Suspect etiology of recurrent atrial tachycardia multifactorial, including fluid retention, anemia, recent right hip fracture repair, and UTI. --Continue  amiodarone 200 twice daily. --Low-dose metoprolol was added to her medication regimen and should be administered per hold parameters and unless symptomatic and more confused or light-headed, given she chronically runs lower blood pressures with reduced cardiac output. --Continue anticoagulation with warfarin per pharmacy and daily INR checks. --Plan to optimize patient status for now and defer cardioversion until improved volume status, treatment of underlying UTI, and therapeutically anticoagulated for at least 3-4 weeks.   Cardioversion may be needed if unable to adequately control patient's rate and later on down the road.  As previously indicated, another option would be to reprogram her device to VVI.   Acute on chronic combined systolic and diastolic CHF/NICM --Device interrogation, repeat BNP, and physical exam show patient continues to be volume overloaded.  --Ordered 1x dose of IV lasix 40mg  today with potassium repletion, which should be administered despite patients  baseline lower BP. Following this increased diuretic dose, will reassess BMET/renal function and fluid status and titrate as needed for optimal urine output.  Continue to monitor I's/O's, daily weights.   --As above, continue low-dose beta-blocker for now and per hold parameters unless significant symptoms (confusion, lightheadedness).  We will continue holding losartan at this time as we are unable to add additional evidence-based heart failure medication at this time and in the setting of softer blood pressure.  Hypokalemia --Replete with goal 4.0.  Check magnesium.  Follow-up a.m. BMET.   Blood loss anemia s/p R hip surgery with R hip hematoma / tear --Monitor.   Valvular heart disease s/p mechanical mitral valve --Continue Coumadin, INR checks.  For questions or updates, please contact [place] Please consult [email or web address] for contact info under        Signed, Arvil Chaco, PA-C  02/03/2019, 8:35 AM

## 2019-02-03 NOTE — Progress Notes (Signed)
In to check on pt throughout the evening. When arrrived for nightshift @1900 , pt was sleeping. Agreed upon waiting for pt to awaken to give medications. Pt continued to be very sleepy, would arouse to voice and then go back to sleep. Would not arouse enough to give nighttime medications. Pt up the two previous nights all night. Made MD aware.

## 2019-02-03 NOTE — Consult Note (Signed)
Pharmacy Antibiotic Note  [name] is a 84 y.o. female admitted on 01/28/2019 with UTI.  Pharmacy has been consulted for Vancomycin dosing. Patient has a Foley catheter and there were concerns for a UTI d/t UA. Ucx came back for ENTEROCOCCUS FAECALIS- which was pan-sensitive. Allergy to PCN.   Plan: Pt received vancomycin 1000 mg x 1 on 8/4. Random level obtain 25 hours post-dose. Vancomycin random was 12. Treating UTI goal trough is typically 10-15. Scr trending down. Will give another vancomycin 1000 mg dose and order random level 24 hours post dose.   Height: 4\' 11"  (149.9 cm) Weight: (Can't not get pt's wt.) IBW/kg (Calculated) : 43.2  Temp (24hrs), Avg:97.7 F (36.5 C), Min:97.4 F (36.3 C), Max:98.3 F (36.8 C)  Recent Labs  Lab 01/31/19 0324 02/02/19 0442 02/03/19 0446 02/03/19 1607  WBC 7.8 7.8  --   --   CREATININE 1.36* 1.57* 1.48*  --   VANCORANDOM  --   --   --  12    Estimated Creatinine Clearance: 22.3 mL/min (A) (by C-G formula based on SCr of 1.48 mg/dL (H)).    Allergies  Allergen Reactions  . Penicillins Rash    ** daughter reports facial swelling**   Has patient had a PCN reaction causing immediate rash, facial/tongue/throat swelling, SOB or lightheadedness with hypotension: YES Has patient had a PCN reaction causing severe rash involving mucus membranes or skin necrosis: NO Has patient had a PCN reaction that required hospitalization NO Has patient had a PCN reaction occurring within the last 10 years: NO If all of the above answers are "NO", then may proceed with Cephalosporin use.  . Ace Inhibitors     Cough  . Doxycycline Nausea Only  . Hydrocodone Itching and Nausea Only  . Mercury     Other reaction(s): Unknown  . Silver Dermatitis    Severe itching  . Cephalexin Rash  . Clindamycin/Lincomycin Rash  . Nickel Rash    Antimicrobials this admission: 8/4 Fosfomycin x 1 8/4 vancomycin >>   Microbiology results: 8/2 UCx:  ENTEROCOCCUS FAECALIS   Thank you for allowing pharmacy to be a part of this patient's care.  Oswald Hillock, PharmD, BCPS 02/03/2019 4:47 PM

## 2019-02-03 NOTE — Progress Notes (Signed)
[facility]  Date: 02/03/19 Time: 4:22 PM  I spoke with the patient's son regarding cardiology treatment plan.  He is concerned about recurrence of atrial tachycardia and would like repeat cardioversion as soon as possible.  While [name] certainly may require cardioversion during this admission, I would favor further medical optimization of her heart failure, UTI, and lethargy/delirium before proceeding with DCCV.  Additionally, she had two days of subtherapeutic INR's in late July (7/27 and 7/28), though in the setting of relatively slow atrial tachycardia, the risk for cardioembolic events is lower than with afib/flutter.  The patient's son has also requested that is mother not receive metoprolol, as she had weakness and lightheadedness while on the medication a few years ago (he refused the medication on behalf of his mother yesterday).  I advised him that beta blockers are an important medication in the treatment of systolic heart failure and atrial arrhythmias.  Additionally, she was receiving metoprolol while at [facility] last month (patient's son says that his mother was doing very well there).  Given [name]'s son's concerns, I will discontinue all beta blockers at this time.  We will continue amiodarone 200 mg BID for now as well as gentle diuresis.  Utility and timing of cardioversion will need to be readdressed on a daily basis.  Nelva Bush, MD [facility] Pager: [phone]

## 2019-02-03 NOTE — Progress Notes (Signed)
Dressing change and wound care provided for sacral wound this afternoon, patient tolerated well.

## 2019-02-03 NOTE — Telephone Encounter (Signed)
Pt c/o medication issue:  1. Name of Medication: metoprolol   2. How are you currently taking this medication (dosage and times per day)? Unknown   3. Are you having a reaction (difficulty breathing--STAT)? weakness tired confused patient was given this med before and ended up breaking back about 3 years ago per son this med may have contributed   4. What is your medication issue? Would like an explanation as to why patient cannot have cardioversion to get back in rhythm instead of po meds   Currently admitted to [facility] and son states this is of hight/ urgent importance and to have Dr Rockey Situ call asap

## 2019-02-03 NOTE — Consult Note (Addendum)
[place] for Warfarin Dosing  Indication: Mitral Valve Replacement, A. Fib    Allergies  Allergen Reactions  . Penicillins Rash    ** daughter reports facial swelling**   Has patient had a PCN reaction causing immediate rash, facial/tongue/throat swelling, SOB or lightheadedness with hypotension: YES Has patient had a PCN reaction causing severe rash involving mucus membranes or skin necrosis: NO Has patient had a PCN reaction that required hospitalization NO Has patient had a PCN reaction occurring within the last 10 years: NO If all of the above answers are "NO", then may proceed with Cephalosporin use.  . Ace Inhibitors     Cough  . Doxycycline Nausea Only  . Hydrocodone Itching and Nausea Only  . Mercury     Other reaction(s): Unknown  . Silver Dermatitis    Severe itching  . Cephalexin Rash  . Clindamycin/Lincomycin Rash  . Nickel Rash    Patient Measurements: Height: 4\' 11"  (149.9 cm) Weight: (Can't not get pt's wt.) IBW/kg (Calculated) : 43.2   Vital Signs: Temp: 97.6 F (36.4 C) (08/05 0511) Temp Source: Oral (08/05 0511) BP: 100/69 (08/05 0511) Pulse Rate: 105 (08/05 0511)  Labs: Recent Labs    01/31/19 0849 02/01/19 0410 02/02/19 0442 02/03/19 0446  HGB 8.0*  --  7.8*  --   HCT  --   --  26.1*  --   PLT  --   --  230  --   LABPROT  --  27.9* 26.2* 24.0*  INR  --  2.7* 2.5* 2.2*  CREATININE  --   --  1.57* 1.48*    Estimated Creatinine Clearance: 22.3 mL/min (A) (by C-G formula based on SCr of 1.48 mg/dL (H)).   Medical History: Past Medical History:  Diagnosis Date  . Arthritis    "back, hands, knees" (04/10/2016)  . Chronic back pain   . 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 EF 25% by echo in 08/2015 b. RHC in 08/2015 showed normal filling pressures; c. 10/2018 Echo (in setting of atrial tachycardia): EF 25-30%, impaired relaxation. Glob HK. RVSP  20mmHg. Mod dil RA/LA. Mech MV w/ nl fxn (mean grad 26mmHg). Mod TR.   Marland Kitchen Chronic kidney disease   . Compression fracture    "several; all in my back" (04/10/2016)  . GERD (gastroesophageal reflux disease)   . Hypertension   . Lymphedema   . Pacemaker 05/2017  . PAF (paroxysmal atrial fibrillation) ([place])    a. On amio & Coumadin - CHA2DS2VASc = 4.  . S/P MVR (mitral valve replacement)    a. MVR 1994 b. redo MVR in 08/2014 - on Coumadin  . Shortness of breath dyspnea    Assessment: Pharmacy consulted for warfarin dosing and monitoring for 84 yo female with PMH of mitral valve replacement and A. Fib. Patient transferred to [facility] 7/20 PM from [place]. Patient's INR is sensitive to changes. We have been giving her a lower dose from her home dose. Patient may need to be discharged on 2 mg daily.   Home dose: warfarin 2.5 mg daily.   DDI: on amio (increase INR)   Spoke to MD on 7/22 regarding low Hgb- okay with continuing warfarin at lower dose.  Today's INR is subtherapeutic, likely d/t the effects from 3-4 days ago and missed dose (unknown reason documented in the [facility]). However, per note from Lucia Bitter, RN, the patient was very sleepy and she was not  able to given nighttime medication. Therefore, will resume dose of 1.5 mg dose for this evening.   DATE INR DOSE 7/23 3.0 1 mg - did not receive.  7/24     2.7       2 mg  7/25 2.4 2 mg 7/26 2.2 2.5 mg 7/27     1.9       4 mg 7/28     1.9       3 mg  7/29 2.3 2.5 mg 7/30     2.9       2 mg 7/31     3.1       2 mg  8/1       4.0       Hold  8/2       3.3       1 mg 8/3 2.7 1 mg 8/4 2.5 1.5 mg - missed this dose 8/5 2.2   Goal of Therapy:  INR 2.5-3.5 (ideally want INR between 2.5 to 3 per previous notes) Monitor platelets by anticoagulation protocol: Yes   Plan:  INR level is subtherapeutic. Will give warfarin 1.5 mg tonight x 1 dose.  Will discuss other anticoagulant options with attending given patient's inability to  regularly take oral medications.   Daily INR ordered. Hgb trending down. Continue to closely monitor CBC.   Pharmacy will continue to follow and order warfarin based on INRs  Rowland Lathe, PharmD 02/03/2019 7:03 AM

## 2019-02-03 NOTE — Progress Notes (Signed)
Physical Therapy Treatment Patient Details Name: [name] MRN: [id number] DOB: 03/24/1935 Today's Date: 02/03/2019    History of Present Illness  84 y.o. female with a known history of right hip repair at [facility] on 01/11/2019 secondary to mechanical fall at home, paroxysmal atrial fibrillation, mitral valve replacement on Coumadin, COPD, CKD stage III, hypertension, history of left lower extremity hematoma with evacuation and porcine graft performed on 12/25/2018.    PT Comments    Pt presents with deficits in strength, transfers, mobility, gait, balance, and activity tolerance but more alert this session with improved participation and progress towards goals.  [name] HR remained somewhat elevated at rest but per chart review cardiologist encouraged aggressive PT to be provided to pt.  Pt required +2 max A with bed mobility tasks with cues for sequencing for increased participation.  Pt was +2 mod A with sit to/from stand transfers with physical assistance for BLE positioning and extensive verbal and tactile cues for sequencing.  Pt presented with significantly improved standing balance this session with a RW requiring mostly CGA and only occasional min A for stability.  Pt was able to take several small, effortful steps with the RW at the EOB and then to the chair with min A for stability and for RW management.  Pt will benefit from PT services in a SNF setting upon discharge to safely address above deficits for decreased caregiver assistance and eventual return to PLOF.      Follow Up Recommendations  SNF     Equipment Recommendations  Other (comment)(TBD at next venue of care)    Recommendations for Other Services       Precautions / Restrictions Precautions Precautions: Fall Precaution Comments: fragile skin, sacral ulcer Restrictions Weight Bearing Restrictions: Yes RLE Weight Bearing: Weight bearing as tolerated    Mobility  Bed Mobility Overal bed mobility: Needs  Assistance Bed Mobility: Supine to Sit     Supine to sit: Max assist;+2 for physical assistance     General bed mobility comments: Near total assist with bed mobility tasks with very little assistance from patient  Transfers Overall transfer level: Needs assistance Equipment used: Rolling walker (2 wheeled) Transfers: Sit to/from Stand   Stand pivot transfers: Mod assist;+2 physical assistance       General transfer comment: Physical assistance getting pt's BLEs in place and max verbal and tactile cues for sequencing  Ambulation/Gait Ambulation/Gait assistance: +2 physical assistance;Min assist Gait Distance (Feet): 3 Feet Assistive device: Rolling walker (2 wheeled) Gait Pattern/deviations: Step-to pattern;Decreased step length - right;Decreased step length - left;Trunk flexed Gait velocity: decreased   General Gait Details: Pt with improved standing balance this session and was able to take several small, effortful steps with mostly CGA and occasional min A for stability   Stairs             Wheelchair Mobility    Modified Rankin (Stroke Patients Only)       Balance Overall balance assessment: Needs assistance Sitting-balance support: Bilateral upper extremity supported Sitting balance-Leahy Scale: Fair Sitting balance - Comments: Pt able to maintain static sitting position at the EOB without physical assistance   Standing balance support: Bilateral upper extremity supported;During functional activity Standing balance-Leahy Scale: Poor Standing balance comment: Standing balance improved this session but pt continues to require occasional min A for stability with a RW  Cognition Arousal/Alertness: Awake/alert Behavior During Therapy: WFL for tasks assessed/performed Overall Cognitive Status: Impaired/Different from baseline                                        Exercises Total Joint Exercises Ankle  Circles/Pumps: AROM;Strengthening;Both;10 reps;5 reps Towel Squeeze: Strengthening;Both;10 reps Heel Slides: AAROM;Both;5 reps Long Arc Quad: AROM;AAROM;Both;5 reps;10 reps Knee Flexion: AROM;AAROM;Both;5 reps;10 reps Marching in Standing: AAROM;Both;10 reps;Seated    General Comments        Pertinent Vitals/Pain Pain Assessment: 0-10 Pain Score: 8  Pain Location: Sacral area Pain Descriptors / Indicators: Aching;Sore Pain Intervention(s): Premedicated before session;Monitored during session    Home Living                      Prior Function            PT Goals (current goals can now be found in the care plan section) Progress towards PT goals: Progressing toward goals    Frequency    Min 2X/week      PT Plan Current plan remains appropriate    Co-evaluation              AM-PAC PT "6 Clicks" Mobility   Outcome Measure  Help needed turning from your back to your side while in a flat bed without using bedrails?: A Lot Help needed moving from lying on your back to sitting on the side of a flat bed without using bedrails?: A Lot Help needed moving to and from a bed to a chair (including a wheelchair)?: A Lot Help needed standing up from a chair using your arms (e.g., wheelchair or bedside chair)?: A Lot Help needed to walk in hospital room?: Total Help needed climbing 3-5 steps with a railing? : Total 6 Click Score: 10    End of Session Equipment Utilized During Treatment: Gait belt;Oxygen Activity Tolerance: Patient tolerated treatment well Patient left: in chair;with family/visitor present;Other (comment)(Pt left in standard arm chair per pt request with nsg aware and family stating will be with pt while pt in chair) Nurse Communication: Mobility status PT Visit Diagnosis: Muscle weakness (generalized) (M62.81);Difficulty in walking, not elsewhere classified (R26.2);Pain Pain - Right/Left: Right Pain - part of body: Hip     Time: 5465-6812 PT  Time Calculation (min) (ACUTE ONLY): 23 min  Charges:  $Therapeutic Exercise: 8-22 mins $Therapeutic Activity: 8-22 mins                     D. Scott Jemiah Cuadra PT, DPT 02/03/19, 4:41 PM

## 2019-02-03 NOTE — Progress Notes (Signed)
Nutrition Follow-up  DOCUMENTATION CODES:   Not applicable  INTERVENTION:  Continue Magic cup TID with meals, each supplement provides 290 kcal and 9 grams of protein.  Continue Ocuvite daily for wound healing (provides zinc, vitamin A, vitamin C, Vitamin E, copper, and selenium).  Continue vitamin C 500 mg po daily.  Recommend appetite stimulant.  NUTRITION DIAGNOSIS:   Increased nutrient needs related to wound healing as evidenced by estimated needs.  Ongoing.  GOAL:   Patient will meet greater than or equal to 90% of their needs Progressing.  MONITOR:   PO intake, Supplement acceptance, Labs, Weight trends, I & O's, Skin  REASON FOR ASSESSMENT:   Malnutrition Screening Tool, Consult Assessment of nutrition requirement/status, Hip fracture protocol, Wound healing  ASSESSMENT:   84 y.o. female with a known history of right hip repair at [facility] on 01/11/2019 secondary to mechanical fall at home, paroxysmal atrial fibrillation, mitral valve replacement on Coumadin, COPD, CKD stage IV requiring CRRT last admit, hypertension, history of left lower extremity hematoma with evacuation and porcine graft performed on 12/25/2018.   Pt with slightly improved appetite and oral intake since admit but still eating <50% of meals and previously refusing all Ensure supplements. Would recommend initiating appetite stimulant as good nutrition will be imperative for wound healing. Pt is receiving Magic Cups on her meal trays. Recommend continue vitamins until wound healing complete. Per WOC note, wound is doing well. VAC in place. Per chart, pt is weight stable since admit.   Medications reviewed and include: colace, ocuvite, protonix, vancomycin, vitamin C, warfarin   Labs reviewed: K 3.3(L), BUN 28(H), creat 1.48(H) Hgb 7.8(L), Hct 26.1(L)  Diet Order:   Diet Order            Diet regular Room service appropriate? Yes; Fluid consistency: Thin  Diet effective now              EDUCATION NEEDS:   Education needs have been addressed  Skin:  Skin Assessment: Reviewed RN Assessment(incision R hip, multiple hematomas and wounds on legs and feet)  Last BM:  8/4  Height:   Ht Readings from Last 1 Encounters:  01/25/2019 4\' 11"  (1.499 m)   Weight:   Wt Readings from Last 1 Encounters:  02/02/19 53.8 kg   Ideal Body Weight:  44.5 kg  BMI:  Body mass index is 23.93 kg/m.  Estimated Nutritional Needs:   Kcal:  1400-1600kcal/day  Protein:  70-80g/day  Fluid:  1.1L/day  Koleen Distance MS, RD, LDN Pager #- [phone] Office#- [phone] After Hours Pager: [phone]

## 2019-02-03 NOTE — Telephone Encounter (Signed)
Returned the call to the pt son Helene Kelp. He is wanting to talk with Dr.End regarding the patient's plan of care.  Adv the patient that I have spoken with Dr.End. Dr. Saunders Revel has recently had a discussion with the pt daughter [name]. Recommended that he talk with [name] to go over what was discussed. If there is still concern Dr.End would be happy to talk with him.  Helene Kelp sts that he did talk with [name] and that is was prompted him to call. He is in disagreement with Dr.End's recommendation and would like to speak with him directly.  Rush Barer that Dr.End would be happy to talk with him, but he will not be available until later this afternoon. Helene Kelp verbalized understanding and will await Dr. Darnelle Bos call.

## 2019-02-04 DIAGNOSIS — L89153 Pressure ulcer of sacral region, stage 3: Secondary | ICD-10-CM

## 2019-02-04 LAB — CBC
HCT: 27.3 % — ABNORMAL LOW (ref 36.0–46.0)
Hemoglobin: 8.2 g/dL — ABNORMAL LOW (ref 12.0–15.0)
MCH: 29.3 pg (ref 26.0–34.0)
MCHC: 30 g/dL (ref 30.0–36.0)
MCV: 97.5 fL (ref 80.0–100.0)
Platelets: 230 10*3/uL (ref 150–400)
RBC: 2.8 MIL/uL — ABNORMAL LOW (ref 3.87–5.11)
RDW: 19.5 % — ABNORMAL HIGH (ref 11.5–15.5)
WBC: 6.7 10*3/uL (ref 4.0–10.5)
nRBC: 0 % (ref 0.0–0.2)

## 2019-02-04 LAB — BASIC METABOLIC PANEL
Anion gap: 9 (ref 5–15)
BUN: 31 mg/dL — ABNORMAL HIGH (ref 8–23)
CO2: 30 mmol/L (ref 22–32)
Calcium: 8.8 mg/dL — ABNORMAL LOW (ref 8.9–10.3)
Chloride: 96 mmol/L — ABNORMAL LOW (ref 98–111)
Creatinine, Ser: 1.83 mg/dL — ABNORMAL HIGH (ref 0.44–1.00)
GFR calc Af Amer: 29 mL/min — ABNORMAL LOW (ref >60)
GFR calc non Af Amer: 25 mL/min — ABNORMAL LOW (ref >60)
Glucose, Bld: 97 mg/dL (ref 70–99)
Potassium: 4.2 mmol/L (ref 3.5–5.1)
Sodium: 135 mmol/L (ref 135–145)

## 2019-02-04 LAB — PROTIME-INR
INR: 2.2 — ABNORMAL HIGH (ref 0.8–1.2)
Prothrombin Time: 24 seconds — ABNORMAL HIGH (ref 11.4–15.2)

## 2019-02-04 MED ORDER — FOSFOMYCIN TROMETHAMINE 3 G PO PACK
3.0000 g | PACK | Freq: Once | ORAL | Status: AC
  Administered 2019-02-04: 3 g via ORAL
  Filled 2019-02-04: qty 3

## 2019-02-04 MED ORDER — WARFARIN SODIUM 2 MG PO TABS
2.0000 mg | ORAL_TABLET | Freq: Once | ORAL | Status: AC
  Administered 2019-02-04: 2 mg via ORAL
  Filled 2019-02-04: qty 1

## 2019-02-04 NOTE — Consult Note (Signed)
[place] for Warfarin Dosing  Indication: Mitral Valve Replacement, A. Fib    Allergies  Allergen Reactions  . Penicillins Rash    ** daughter reports facial swelling**   Has patient had a PCN reaction causing immediate rash, facial/tongue/throat swelling, SOB or lightheadedness with hypotension: YES Has patient had a PCN reaction causing severe rash involving mucus membranes or skin necrosis: NO Has patient had a PCN reaction that required hospitalization NO Has patient had a PCN reaction occurring within the last 10 years: NO If all of the above answers are "NO", then may proceed with Cephalosporin use.  . Ace Inhibitors     Cough  . Doxycycline Nausea Only  . Hydrocodone Itching and Nausea Only  . Mercury     Other reaction(s): Unknown  . Silver Dermatitis    Severe itching  . Cephalexin Rash  . Clindamycin/Lincomycin Rash  . Nickel Rash    Patient Measurements: Height: 4\' 11"  (149.9 cm) Weight: (Can't not get pt's wt.) IBW/kg (Calculated) : 43.2   Vital Signs: Temp: 97.5 F (36.4 C) (08/06 0728) Temp Source: Oral (08/06 0728) BP: 106/72 (08/06 0728) Pulse Rate: 107 (08/06 0728)  Labs: Recent Labs    02/02/19 0442 02/03/19 0446 02/04/19 0425  HGB 7.8*  --  8.2*  HCT 26.1*  --  27.3*  PLT 230  --  230  LABPROT 26.2* 24.0* 24.0*  INR 2.5* 2.2* 2.2*  CREATININE 1.57* 1.48* 1.83*    Estimated Creatinine Clearance: 18 mL/min (A) (by C-G formula based on SCr of 1.83 mg/dL (H)).   Medical History: Past Medical History:  Diagnosis Date  . Arthritis    "back, hands, knees" (04/10/2016)  . Chronic back pain   . 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 EF 25% by echo in 08/2015 b. RHC in 08/2015 showed normal filling pressures; c. 10/2018 Echo (in setting of atrial tachycardia): EF 25-30%, impaired relaxation. Glob HK. RVSP 28mmHg. Mod dil RA/LA. Mech MV w/ nl fxn (mean grad  12mmHg). Mod TR.   Marland Kitchen Chronic kidney disease   . Compression fracture    "several; all in my back" (04/10/2016)  . GERD (gastroesophageal reflux disease)   . Hypertension   . Lymphedema   . Pacemaker 05/2017  . PAF (paroxysmal atrial fibrillation) ([place])    a. On amio & Coumadin - CHA2DS2VASc = 4.  . S/P MVR (mitral valve replacement)    a. MVR 1994 b. redo MVR in 08/2014 - on Coumadin  . Shortness of breath dyspnea    Assessment: Pharmacy consulted for warfarin dosing and monitoring for 84 yo female with PMH of mitral valve replacement and A. Fib. Patient transferred to [facility] 7/20 PM from [place]. Patient's INR is sensitive to changes. We have been giving her a lower dose from her home dose. Patient may need to be discharged on 2 mg daily.   Home dose: warfarin 2.5 mg daily.   DDI: on amio (increase INR); megace started 8/5 (may increase risk of bleeding/INR)  Spoke to MD on 7/22 regarding low Hgb- okay with continuing warfarin at lower dose.  Today's INR is subtherapeutic, likely d/t the missed dose and the effects of held/reduced doses. Will need to be conservative with boosting dose since the patient is sensitive to dose changes.    DATE INR DOSE 7/23 3.0 1 mg - did not receive.  7/24     2.7  2 mg  7/25 2.4 2 mg 7/26 2.2 2.5 mg 7/27     1.9       4 mg 7/28     1.9       3 mg  7/29 2.3 2.5 mg 7/30     2.9       2 mg 7/31     3.1       2 mg  8/1       4.0       Hold  8/2       3.3       1 mg 8/3 2.7 1 mg 8/4 2.5 1.5 mg - missed this dose 8/5 2.2 1.5 mg  8/6 2.2  Goal of Therapy:  INR 2.5-3.5 (ideally want INR between 2.5 to 3 per previous notes) Monitor platelets by anticoagulation protocol: Yes   Plan:  INR level is subtherapeutic. Will give warfarin 2 mg tonight x 1 dose.   Daily INR ordered. Hgb trending down. Continue to closely monitor CBC.   Pharmacy will continue to follow and order warfarin based on INRs  Rowland Lathe, PharmD 02/04/2019 7:59  AM

## 2019-02-04 NOTE — Progress Notes (Signed)
Occupational Therapy Treatment Patient Details Name: [name] MRN: [id number] DOB: Mar 30, 1935 Today's Date: 02/04/2019    History of present illness  84 y.o. female with a known history of right hip repair at [facility] on 01/11/2019 secondary to mechanical fall at home, paroxysmal atrial fibrillation, mitral valve replacement on Coumadin, COPD, CKD stage III, hypertension, history of left lower extremity hematoma with evacuation and porcine graft performed on 12/25/2018.   OT comments  Pt resting in bed when OT presents for tx. Daughter is present in room stating pt just got comfortable in bed with pillows arranged and received pain meds. Pt politely declines mobility tasks for this session. However, both parties agreeable to pt engaging in in-bed therex. OT engages pt in below-mentioned exercises within her tolerance utilizing yellow resistance band. Pt requires MIN verbal cues for form and pace, and requires increased time as well as rest breaks in between each exercise of 1-2 minutes. Discharge recommendations for SNF appear to remain appropriate based on pt current activity tolerance.     Follow Up Recommendations  SNF    Equipment Recommendations  Other (comment)(defer to next level of care)    Recommendations for Other Services      Precautions / Restrictions Precautions Precautions: Fall Precaution Comments: fragile skin, sacral ulcer Restrictions Weight Bearing Restrictions: No RLE Weight Bearing: Weight bearing as tolerated       Mobility Bed Mobility               General bed mobility comments: Pt and dtr who is present politely decline participation in bed mobility tasks this date due to pt just getting comfortable in bed.  Transfers                      Balance                                           ADL either performed or assessed with clinical judgement   ADL                                               Vision Baseline Vision/History: Wears glasses Wears Glasses: At all times Patient Visual Report: No change from baseline     Perception     Praxis      Cognition Arousal/Alertness: Awake/alert Behavior During Therapy: WFL for tasks assessed/performed Overall Cognitive Status: Impaired/Different from baseline Area of Impairment: Following commands                                        Exercises Other Exercises Other Exercises: OT engages pt in elbow flex/ext and shld flexion with elbow extension for 10 reps bilaterally with yellow resistance band, pt cannot tolerate shld therex past 90 degrees shld flexion on R side. Pt politely declines furhter therex after completing these 3 and is closing eyes.   Shoulder Instructions       General Comments      Pertinent Vitals/ Pain       Pain Assessment: Faces Faces Pain Scale: Hurts little more Pain Location: Sacral area, some R shoudler soreness as well Pain Descriptors / Indicators: Aching;Sore  Pain Intervention(s): Limited activity within patient's tolerance;Monitored during session  Home Living                                          Prior Functioning/Environment              Frequency  Min 1X/week        Progress Toward Goals  OT Goals(current goals can now be found in the care plan section)  Progress towards OT goals: Progressing toward goals  Acute Rehab OT Goals Patient Stated Goal: start doing more for myself again OT Goal Formulation: With patient Time For Goal Achievement: 03-06-19 Potential to Achieve Goals: Good  Plan Discharge plan remains appropriate    Co-evaluation                 AM-PAC OT "6 Clicks" Daily Activity     Outcome Measure   Help from another person eating meals?: A Lot Help from another person taking care of personal grooming?: A Lot Help from another person toileting, which includes using toliet, bedpan, or urinal?:  Total Help from another person bathing (including washing, rinsing, drying)?: Total Help from another person to put on and taking off regular upper body clothing?: A Lot Help from another person to put on and taking off regular lower body clothing?: Total 6 Click Score: 9    End of Session    OT Visit Diagnosis: Muscle weakness (generalized) (M62.81);History of falling (Z91.81);Pain Pain - Right/Left: Right Pain - part of body: Hip   Activity Tolerance Patient limited by pain;Patient limited by fatigue   Patient Left in bed;with call bell/phone within reach;with family/visitor present;with bed alarm set   Nurse Communication          Time: [id number] OT Time Calculation (min): 20 min  Charges: OT General Charges $OT Visit: 1 Visit OT Treatments $Therapeutic Exercise: 8-22 mins   Gerrianne Scale, MS, OTR/L ascom [phone] or [phone] 02/04/19, 2:33 PM

## 2019-02-04 NOTE — Care Management Important Message (Signed)
Important Message  Patient Details  Name: [name] MRN: [id number] Date of Birth: [birth year]   Medicare Important Message Given:  Yes     [name] 02/04/2019, 2:04 PM

## 2019-02-04 NOTE — Progress Notes (Signed)
Foam dressing changed on patient coccyx.  Santyl applies. Pressure injury noted to be unstageable due to slough.  This RN showed daughter the wound and daughter states that the wound looks worse than yesterday.  Wound does appear to have some tunneling.  Dr. Margaretmary Eddy made aware.  Will reconsult wound care nurse.

## 2019-02-04 NOTE — Consult Note (Signed)
[facility] SURGICAL CONSULTATION NOTE (initial) - cpt: [phone]   HISTORY OF PRESENT ILLNESS (HPI):  84 y.o. female known to our practice who was admitted in June s/p fall resulted in right hip fracture. She has had prolonged hospitalization including temporary transfer to [facility] for surgical management of this fracture. She was discharged to [facility] 07/07 and returned to [facility] on 07/20. She was found to have coccyx pressure on 08/01 and WOC RN was consulted. Since that time, she has been undergoing local wound care to include low pressure mattress, frequent repositioning, and santyl dressing changes. Patient does report some discomfort in her sacrum. No fever or chills. Her daughter is at bedside. She is recovering from ORIF of right intertrochanteric femur fracture and working with PT who recommend SNF. She is not mobilizing well at this point.     Surgery is consulted by hospitalist physician Dr. Margaretmary Eddy, MD in this context for evaluation and management of sacral pressure injury.   PAST MEDICAL HISTORY (PMH):  Past Medical History:  Diagnosis Date  . Arthritis    "back, hands, knees" (04/10/2016)  . Chronic back pain   . 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 EF 25% by echo in 08/2015 b. RHC in 08/2015 showed normal filling pressures; c. 10/2018 Echo (in setting of atrial tachycardia): EF 25-30%, impaired relaxation. Glob HK. RVSP 2mmHg. Mod dil RA/LA. Mech MV w/ nl fxn (mean grad 58mmHg). Mod TR.   Marland Kitchen Chronic kidney disease   . Compression fracture    "several; all in my back" (04/10/2016)  . GERD (gastroesophageal reflux disease)   . Hypertension   . Lymphedema   . Pacemaker 05/2017  . PAF (paroxysmal atrial fibrillation) ([place])    a. On amio & Coumadin - CHA2DS2VASc = 4.  . S/P MVR (mitral valve replacement)    a. MVR 1994 b. redo MVR in 08/2014 - on Coumadin  . Shortness of breath dyspnea      PAST SURGICAL HISTORY (Friend):   Past Surgical History:  Procedure Laterality Date  . BREAST LUMPECTOMY Right 2005?   benign lump excision  . CARDIAC CATHETERIZATION    . [place], 2016   "MVR; MVR"  . CARDIOVERSION N/A 11/06/2018   Procedure: CARDIOVERSION;  Surgeon: Minna Merritts, MD;  Location: [facility] ORS;  Service: Cardiovascular;  Laterality: N/A;  . CATARACT EXTRACTION W/PHACO Right 02/20/2015   Procedure: CATARACT EXTRACTION PHACO AND INTRAOCULAR LENS PLACEMENT ([place]);  Surgeon: Estill Cotta, MD;  Location: [facility] ORS;  Service: Ophthalmology;  Laterality: Right;  US01:31.2 AP 25.3% CDE40.13 Fluid lot # [id number]  . DRESSING CHANGE UNDER ANESTHESIA Left 11/17/2018   Procedure: DRESSING CHANGE;  Surgeon: Robert Bellow, MD;  Location: [facility] ORS;  Service: General;  Laterality: Left;  no anesthesia  . ELECTROPHYSIOLOGIC STUDY N/A 10/09/2015   Procedure: CARDIOVERSION;  Surgeon: Wellington Hampshire, MD;  Location: [facility] ORS;  Service: Cardiovascular;  Laterality: N/A;  . INCISION AND DRAINAGE OF WOUND Left 11/13/2018   Procedure: LEFT LEG DRESSING CHANGE;  Surgeon: Robert Bellow, MD;  Location: [facility] ORS;  Service: General;  Laterality: Left;  . INCISION AND DRAINAGE OF WOUND Left 11/19/2018   Procedure: LEFT LEG DRESSING CHANGE AND GRAFT APPLICATION;  Surgeon: Robert Bellow, MD;  Location: [facility] ORS;  Service: General;  Laterality: Left;  . IR GENERIC HISTORICAL  04/01/2016   IR RADIOLOGIST EVAL & MGMT 04/01/2016 [facility]  . IR GENERIC HISTORICAL  04/15/2016   IR VERTEBROPLASTY CERV/THOR BX INC UNI/BIL INC/INJECT/IMAGING 04/15/2016 Luanne Bras, MD [facility]  . IR GENERIC HISTORICAL  04/15/2016   IR VERTEBROPLASTY EA ADDL (T&LS) BX INC UNI/BIL INC INJECT/IMAGING 04/15/2016 Luanne Bras, MD [facility]  . IR GENERIC HISTORICAL  04/15/2016   IR VERTEBROPLASTY EA ADDL (T&LS) BX INC UNI/BIL INC INJECT/IMAGING 04/15/2016 Luanne Bras, MD [facility]  . IR GENERIC  HISTORICAL  05/20/2016   IR RADIOLOGIST EVAL & MGMT 05/20/2016 [facility]  . IR GENERIC HISTORICAL  06/13/2016   IR VERTEBROPLASTY EA ADDL (T&LS) BX INC UNI/BIL INC INJECT/IMAGING 06/13/2016 Luanne Bras, MD [facility]  . IR GENERIC HISTORICAL  06/13/2016   IR SACROPLASTY BILATERAL 06/13/2016 Luanne Bras, MD [facility]  . IR GENERIC HISTORICAL  06/13/2016   IR VERTEBROPLASTY LUMBAR BX INC UNI/BIL INC/INJECT/IMAGING 06/13/2016 Luanne Bras, MD [facility]  . IRRIGATION AND DEBRIDEMENT HEMATOMA Left 07/05/2015   Procedure: IRRIGATION AND DEBRIDEMENT HEMATOMA;  Surgeon: Robert Bellow, MD;  Location: [facility] OR[facility];  Service: General;  Laterality: Left;  . IRRIGATION AND DEBRIDEMENT HEMATOMA Left 11/11/2018   Procedure: IRRIGATION AND DEBRIDEMENT LEFT LEG HEMATOMA;  Surgeon: Robert Bellow, MD;  Location: [facility] OR[facility];  Service: General;  Laterality: Left;  . Open reduction and internal fixation of a right intertrochanteric femur fracture  01/11/2019   Dr. Margarita Rana ([place])  . pyloric stenosis  07/1937  . US ECHOCARDIOGRAPHY       MEDICATIONS:  Prior to Admission medications   Medication Sig Start Date End Date Taking? Authorizing Provider  acetaminophen (TYLENOL) 325 MG tablet Take 2 tablets (650 mg total) by mouth every 6 (six) hours as needed for mild pain (or Fever >/= 101). Patient taking differently: Take 975 mg by mouth 3 (three) times daily.  01/05/19  Yes Darel Hong D, NP  amiodarone (PACERONE) 200 MG tablet Take 1 tablet (200 mg total) by mouth daily. 09/24/18  Yes Gollan, Kathlene November, MD  calcium carbonate (OSCAL) 1500 (600 Ca) MG TABS tablet Take 1,500 mg by mouth daily with breakfast.   Yes [provider]  cholecalciferol (VITAMIN D3) 10 MCG (400 UNIT) TABS tablet Take 1,600 Units by mouth daily.   Yes [provider]  diclofenac (FLECTOR) 1.3 % PTCH Place 1 patch onto the skin 2 (two) times daily.   Yes [provider]  FUROSEMIDE  IN SODIUM CHLORIDE IV Inject 500 mg into the vein continuous. Rate 56ml/hr (10mg /hr)   Yes [provider]  ipratropium-albuterol (DUONEB) 0.5-2.5 (3) MG/3ML SOLN Take 3 mLs by nebulization every 6 (six) hours as needed for up to 90 doses. 12/15/18  Yes Gouru, Aruna, MD  lidocaine (LIDODERM) 5 % Place 1 patch onto the skin daily. Remove & Discard patch within 12 hours or as directed by MD   Yes [provider]  LIDOCAINE HCL IJ Inject 0.5 mLs as directed daily as needed (for PIV insertion).   Yes [provider]  Melatonin 5 MG TABS Take 1 tablet (5 mg total) by mouth at bedtime as needed (sleep). 01/05/19  Yes Darel Hong D, NP  metoprolol succinate (TOPROL-XL) 12.5 mg TB24 24 hr tablet Take 6.25 mg by mouth 2 (two) times a day.   Yes [provider]  multivitamin-lutein (OCUVITE-LUTEIN) CAPS capsule Take 1 capsule by mouth daily. 01/06/19  Yes Bradly Bienenstock, NP  ondansetron (ZOFRAN) 4 MG/2ML SOLN injection Inject 2 mLs (4 mg total) into the vein every 6 (six) hours as needed for nausea.  01/05/19  Yes Darel Hong D, NP  oxyCODONE (OXY IR/ROXICODONE) 5 MG immediate release tablet Take 2.5 mg by mouth every 4 (four) hours as needed for severe pain.   Yes [provider]  vitamin C (ASCORBIC ACID) 500 MG tablet Take 500 mg by mouth daily.   Yes [provider]  WARFARIN SODIUM PO Take 2.5 mg by mouth.    Yes [provider]  acetaminophen (TYLENOL) 650 MG suppository Place 1 suppository (650 mg total) rectally every 6 (six) hours as needed for mild pain (or Fever >/= 101). 01/05/19   Bradly Bienenstock, NP  Chlorhexidine Gluconate Cloth 2 % PADS Apply 6 each topically daily. Patient not taking: Reported on 01/06/2019 01/06/19   Darel Hong D, NP  heparin 1000 unit/mL SOLN injection 1-6 mLs (1,000-6,000 Units total) by CRRT route as needed (Use to fill CRRT catheter with heparin 1000 units/mL per catheter volume.). Patient not taking:  Reported on 01/26/2019 01/05/19   Darel Hong D, NP  heparin 25000-0.45 UT/250ML-% infusion Inject 1,350 Units/hr into the vein continuous. Patient not taking: Reported on 01/16/2019 01/05/19   Bradly Bienenstock, NP  traMADol (ULTRAM) 50 MG tablet Take 1 tablet (50 mg total) by mouth every 6 (six) hours as needed for moderate pain. Patient not taking: Reported on 01/02/2019 01/05/19   Bradly Bienenstock, NP     ALLERGIES:  Allergies  Allergen Reactions  . Penicillins Rash    ** daughter reports facial swelling**   Has patient had a PCN reaction causing immediate rash, facial/tongue/throat swelling, SOB or lightheadedness with hypotension: YES Has patient had a PCN reaction causing severe rash involving mucus membranes or skin necrosis: NO Has patient had a PCN reaction that required hospitalization NO Has patient had a PCN reaction occurring within the last 10 years: NO If all of the above answers are "NO", then may proceed with Cephalosporin use.  . Ace Inhibitors     Cough  . Doxycycline Nausea Only  . Hydrocodone Itching and Nausea Only  . Mercury     Other reaction(s): Unknown  . Silver Dermatitis    Severe itching  . Cephalexin Rash  . Clindamycin/Lincomycin Rash  . Nickel Rash     SOCIAL HISTORY:  Social History   Socioeconomic History  . Marital status: Widowed    Spouse name: Not on file  . Number of children: Not on file  . Years of education: Not on file  . Highest education level: Not on file  Occupational History  . Not on file  Social Needs  . Financial resource strain: Not on file  . Food insecurity    Worry: Not on file    Inability: Not on file  . Transportation needs    Medical: Not on file    Non-medical: Not on file  Tobacco Use  . Smoking status: Former Smoker    Packs/day: 1.00    Years: 27.00    Pack years: 27.00    Types: Cigarettes    Quit date: 07/01/1980    Years since quitting: 38.6  . Smokeless tobacco: Never Used  . Tobacco comment:  "quit smoking ~ 1980  Substance and Sexual Activity  . Alcohol use: Not Currently    Alcohol/week: 0.0 standard drinks    Comment: 04/10/2016 "I'll have a drink on holidays/special occasions"  . Drug use: No  . Sexual activity: Never  Lifestyle  . Physical activity    Days per week: Not on file  Minutes per session: Not on file  . Stress: Not on file  Relationships  . Social Herbalist on phone: Not on file    Gets together: Not on file    Attends religious service: Not on file    Active member of club or organization: Not on file    Attends meetings of clubs or organizations: Not on file    Relationship status: Not on file  . Intimate partner violence    Fear of current or ex partner: Not on file    Emotionally abused: Not on file    Physically abused: Not on file    Forced sexual activity: Not on file  Other Topics Concern  . Not on file  Social History Narrative  . Not on file     FAMILY HISTORY:  Family History  Problem Relation Age of Onset  . Stroke Mother   . Renal Disease Father       REVIEW OF SYSTEMS:  Review of Systems  Constitutional: Negative for chills and fever.  Skin:       + Sacral Pressure Injury    VITAL SIGNS:  Temp:  [97.5 F (36.4 C)-97.9 F (36.6 C)] 97.5 F (36.4 C) (08/06 0728) Pulse Rate:  [107-112] 107 (08/06 0728) Resp:  [19-20] 20 (08/06 0437) BP: (86-112)/(60-76) 106/72 (08/06 0728) SpO2:  [93 %-100 %] 100 % (08/06 0728)     Height: 4\' 11"  (149.9 cm) Weight: (Can't not get pt's wt.) BMI (Calculated): 23.92   INTAKE/OUTPUT:  08/05 0701 - 08/06 0700 In: 200 [IV Piggyback:200] Out: 400 [Urine:400]  PHYSICAL EXAM:  Physical Exam Vitals signs and nursing note reviewed.  Constitutional:      General: She is not in acute distress.    Appearance: She is not ill-appearing.     Comments: Frail  HENT:     Head: Normocephalic and atraumatic.  Eyes:     General: No scleral icterus.    Conjunctiva/sclera: Conjunctivae  normal.  Pulmonary:     Effort: Pulmonary effort is normal. No respiratory distress.  Genitourinary:    Comments: Deferred Musculoskeletal:       Legs:  Skin:    General: Skin is cool.     Findings: Signs of injury (Pressure Injury - Sacrum) present.       Neurological:     General: No focal deficit present.     Mental Status: She is alert. She is disoriented.  Psychiatric:        Mood and Affect: Mood normal.        Behavior: Behavior normal.    Wound (Sacrum) s/p bedside debridement on 02/04/2019:      Labs:  CBC Latest Ref Rng & Units 02/04/2019 02/02/2019 01/31/2019  WBC 4.0 - 10.5 K/uL 6.7 7.8 -  Hemoglobin 12.0 - 15.0 g/dL 8.2(L) 7.8(L) 8.0(L)  Hematocrit 36.0 - 46.0 % 27.3(L) 26.1(L) -  Platelets 150 - 400 K/uL 230 230 -   CMP Latest Ref Rng & Units 02/04/2019 02/03/2019 02/02/2019  Glucose 70 - 99 mg/dL 97 84 100(H)  BUN 8 - 23 mg/dL 31(H) 28(H) 27(H)  Creatinine 0.44 - 1.00 mg/dL 1.83(H) 1.48(H) 1.57(H)  Sodium 135 - 145 mmol/L 135 138 137  Potassium 3.5 - 5.1 mmol/L 4.2 3.3(L) 3.4(L)  Chloride 98 - 111 mmol/L 96(L) 97(L) 95(L)  CO2 22 - 32 mmol/L 30 32 32  Calcium 8.9 - 10.3 mg/dL 8.8(L) 8.6(L) 8.7(L)  Total Protein 6.5 - 8.1 g/dL - - -  Total Bilirubin 0.3 - 1.2 mg/dL - - -  Alkaline Phos 38 - 126 U/L - - -  AST 15 - 41 U/L - - -  ALT 0 - 44 U/L - - -    Imaging studies:  No pertinent imaging studies to this consult   Assessment/Plan: (ICD-10's: L89.153) 84 y.o. female with what appears to be stage 2-3 sacral pressure injury without evidence of necrosis or infection, complicated by pertinent comorbidities including deconditioned state, advanced age, and recent ORIF.   - Bedside debridement performed  - Will continue to reassess; may need formal debridement in OR in the future  - Continue local wound care; frequent repositioning; santyl dressing changes daily  - optimize from nutritional standpoint  - mobilization as tolerated given recent ORIF; working with  PT  - Further management per medical team  All of the above findings and recommendations were discussed with the patient and her family (daughter at bedside), and all of patient's and her family's questions were answered to their expressed satisfaction.  Thank you for the opportunity to participate in this patient's care.   -- Edison Simon, PA-C [facility] 02/04/2019, 2:39 PM [phone] M-F: 7am - 4pm

## 2019-02-04 NOTE — Consult Note (Signed)
WOC consulted for coccyx wound, discussed with bedside nurse. Surgical consultation pending per Dr. Margaretmary Eddy also. Will plan to see patient after surgical consultation.   I have placed a request via Secure Chat to Dr. Margaretmary Eddy requesting photos of the wound areas of concern to be placed in the EMR.   [place], [place], [place]

## 2019-02-04 NOTE — TOC Progression Note (Signed)
Transition of Care [facility]) - Progression Note    Patient Details  Name: [name] MRN: [id number] Date of Birth: [birth year]  Transition of Care [facility] CM/SW Contact  Ross Ludwig, [place] Phone Number: 02/04/2019, 8:00 PM  Clinical Narrative:     CSW spoke with patient's daughter she would only like [facility] for SNF placement.  CSW contacted [facility], and they do not have any beds available and are not sure when they will.  CSW updated patient's daughter who was at bedside and she is in agreement to having her go home with home health.  Expected Discharge Plan: (home with home health vs SNF)    Expected Discharge Plan and Services Expected Discharge Plan: (home with home health vs SNF)   Discharge Planning Services: CM Consult   Living arrangements for the past 2 months: [facility] settiings)                 DME Arranged: (Heads up to [facility] with [facility] for hospital bed. Have entered medical necissity for the bed) DME Agency: [facility]     Representative spoke with at DME Agency: [place] Arranged: RN, PT, OT, Nurse's Aide [place] Agency: [place] ([place]) Date [place]: 01/19/19       Social Determinants of Health (SDOH) Interventions    Readmission Risk Interventions Readmission Risk Prevention Plan 12/26/2018 12/07/2018 11/30/2018  Transportation Screening Complete Complete Complete  PCP or Specialist Appt within 3-5 Days - Complete -  HRI or Home Care Consult Complete Complete -  Social Work Consult for [place] Planning/Counseling Complete Not Complete -  SW consult not completed comments - NA -  Palliative Care Screening - Not Applicable -  Medication Review Press photographer) Complete Complete Complete  PCP or Specialist appointment within 3-5 days of discharge - - Complete  HRI or [place] - - Complete  SW Recovery Care/Counseling Consult - - Not Complete  SW Consult Not Complete Comments - - na  Palliative Care  Screening - - Not [place] - - Complete  Some recent data might be hidden

## 2019-02-04 NOTE — Consult Note (Signed)
Pharmacy Antibiotic Note  [name] is a 84 y.o. female admitted on 01/21/2019 with UTI.  Pharmacy has been consulted for Vancomycin dosing. Patient has a Foley catheter and there were concerns for a UTI d/t UA. Ucx came back for ENTEROCOCCUS FAECALIS- which was pan-sensitive. Allergy to PCN.   Scr 1.57 >> 1.48 >> 1.83 (today)  Patient has had two doses of Vancomycin and renal function is worsening, may need to assess utility of medication moving forward.   Plan: Pt received vancomycin 1000 mg x 1 on 8/4. Random level obtain 25 hours post-dose. Vancomycin random was 12. Treating UTI goal trough is typically 10-15. Scr trending down. Will give another vancomycin 1000 mg dose and order random level 24 hours post dose.   Height: 4\' 11"  (149.9 cm) Weight: (Can't not get pt's wt.) IBW/kg (Calculated) : 43.2  Temp (24hrs), Avg:97.7 F (36.5 C), Min:97.5 F (36.4 C), Max:97.9 F (36.6 C)  Recent Labs  Lab 01/31/19 0324 02/02/19 0442 02/03/19 0446 02/03/19 1607 02/04/19 0425  WBC 7.8 7.8  --   --  6.7  CREATININE 1.36* 1.57* 1.48*  --  1.83*  VANCORANDOM  --   --   --  12  --     Estimated Creatinine Clearance: 18 mL/min (A) (by C-G formula based on SCr of 1.83 mg/dL (H)).    Allergies  Allergen Reactions  . Penicillins Rash    ** daughter reports facial swelling**   Has patient had a PCN reaction causing immediate rash, facial/tongue/throat swelling, SOB or lightheadedness with hypotension: YES Has patient had a PCN reaction causing severe rash involving mucus membranes or skin necrosis: NO Has patient had a PCN reaction that required hospitalization NO Has patient had a PCN reaction occurring within the last 10 years: NO If all of the above answers are "NO", then may proceed with Cephalosporin use.  . Ace Inhibitors     Cough  . Doxycycline Nausea Only  . Hydrocodone Itching and Nausea Only  . Mercury     Other reaction(s): Unknown  . Silver Dermatitis    Severe  itching  . Cephalexin Rash  . Clindamycin/Lincomycin Rash  . Nickel Rash    Antimicrobials this admission: 8/4 Fosfomycin x 1 (partial dose administered to the patient per nurse) 8/4 vancomycin >>   Microbiology results: 8/2 UCx: ENTEROCOCCUS FAECALIS   Thank you for allowing pharmacy to be a part of this patient's care.  Rowland Lathe, PharmD 02/04/2019 8:00 AM

## 2019-02-04 NOTE — Progress Notes (Signed)
Progress Note  Patient Name: [name] Date of Encounter: 02/04/2019  Primary Cardiologist: Ida Rogue, MD   Subjective   She reported that she does not feel well this AM. Overall, however, she felt better than yesterday 8/5.   Her main complaint was of feeling her heart beat rapidly in her chest. She denied shortness of breath and lightheadedness. She reported that she she urinated "some but not much" over the last 24 hours.   Inpatient Medications    Scheduled Meds: . acetaminophen  650 mg Oral Once  . amiodarone  200 mg Oral BID  . collagenase   Topical Daily  . docusate sodium  100 mg Oral BID  . mouth rinse  15 mL Mouth Rinse BID  . megestrol  200 mg Oral BID  . multivitamin-lutein  1 capsule Oral Daily  . pantoprazole  40 mg Oral Daily  . sodium chloride flush  3 mL Intravenous Q12H  . vancomycin variable dose per unstable renal function (pharmacist dosing)   Does not apply See admin instructions  . vitamin C  500 mg Oral Daily  . warfarin  2 mg Oral ONCE-1800  . Warfarin - Pharmacist Dosing Inpatient   Does not apply q1800   Continuous Infusions:  PRN Meds: acetaminophen, acetaminophen, calcium carbonate, ipratropium-albuterol, liver oil-zinc oxide, Melatonin, menthol-cetylpyridinium **OR** phenol, metoCLOPramide **OR** metoCLOPramide (REGLAN) injection, nystatin, ondansetron **OR** ondansetron (ZOFRAN) IV, oxyCODONE   Vital Signs    Vitals:   02/03/19 1638 02/03/19 2016 02/04/19 0437 02/04/19 0728  BP: 112/76 (!) 86/60 92/64 106/72  Pulse: (!) 111 (!) 112 (!) 107 (!) 107  Resp: 19 20 20    Temp: 97.7 F (36.5 C) 97.9 F (36.6 C)  (!) 97.5 F (36.4 C)  TempSrc: Oral Oral  Oral  SpO2: 97% 93% 94% 100%  Weight:      Height:        Intake/Output Summary (Last 24 hours) at 02/04/2019 0814 Last data filed at 02/04/2019 0023 Gross per 24 hour  Intake 200 ml  Output 400 ml  Net -200 ml   Last 3 Weights 02/03/2019 02/02/2019 02/01/2019  Weight (lbs) (No  Data) 118 lb 8 oz 105 lb 9.6 oz  Weight (kg) (No Data) 53.751 kg 47.9 kg  Some encounter information is confidential and restricted. Go to Review Flowsheets activity to see all data.      Telemetry    V paced, low 100s bpm- Personally Reviewed  ECG    No new tracings- Personally Reviewed  Physical Exam   GEN:  Frail and elderly female.   Neck:  JVP ~11-12cm Cardiac:  Tachycardic but regular, 2/6 systolic murmur. No rubs, or gallops.  Respiratory: Poor inspiratory effort with bilaterally diminished breath sounds as auscultated anteriorly as patient is too weak for posterior auscultation GI: Soft, nontender, non-distended  MS: 1+ bilateral dependent edema of thighs, b/l lower extremities wrapped in bandages; No deformity. Neuro:  Nonfocal  Psych: Normal affect   Labs    High Sensitivity Troponin:  No results for input(s): TROPONINIHS in the last 720 hours.    Cardiac EnzymesNo results for input(s): TROPONINI in the last 168 hours. No results for input(s): TROPIPOC in the last 168 hours.   Chemistry Recent Labs  Lab 02/02/19 0442 02/03/19 0446 02/04/19 0425  NA 137 138 135  K 3.4* 3.3* 4.2  CL 95* 97* 96*  CO2 32 32 30  GLUCOSE 100* 84 97  BUN 27* 28* 31*  CREATININE 1.57* 1.48* 1.83*  CALCIUM 8.7* 8.6* 8.8*  GFRNONAA 31* 33* 25*  GFRAA 35* 38* 29*  ANIONGAP 10 9 9      Hematology Recent Labs  Lab 01/31/19 0324 01/31/19 0849 02/02/19 0442 02/04/19 0425  WBC 7.8  --  7.8 6.7  RBC 2.64*  --  2.65* 2.80*  HGB 7.6* 8.0* 7.8* 8.2*  HCT 25.5*  --  26.1* 27.3*  MCV 96.6  --  98.5 97.5  MCH 28.8  --  29.4 29.3  MCHC 29.8*  --  29.9* 30.0  RDW 19.2*  --  19.8* 19.5*  PLT 190  --  230 230    BNP Recent Labs  Lab 02/02/19 1047  BNP 1,572.0*     DDimer No results for input(s): DDIMER in the last 168 hours.   Radiology    No results found.  Cardiac Studies   TTE 11/05/2018 1. The left ventricle has severely reduced systolic function, with an ejection  fraction of 25-30%. The cavity size was mildly dilated. Left ventricular diastolic Doppler parameters are consistent with impaired relaxation. Left ventrical global  hypokinesis without regional wall motion abnormalities. 2. The right ventricle has mildly reduced systolic function. The cavity was moderately enlarged. There is no increase in right ventricular wall thickness. Right ventricular systolic pressure is moderately elevated with an estimated pressure of 53 mmHg. 3. Right atrial size was moderately dilated. 4. Left atrial size was moderately dilated. 5. The aortic valve was not well visualized. Moderate calcification of the aortic valve. Aortic valve regurgitation was not assessed by color flow Doppler. Unable to estimate adegree of aortic valve stenosis given severely reduced systolic function. 6. Mechanical mitral valve. Mean gradient 5 mm Hg. 7. Tricuspid valve regurgitation is moderate. 8. Left pleural effusion noted, 6.5 cm 9. Rhythm is atrial tachycardia, rate 105 bpm  Patient Profile     84 y.o. female with a hx of atrial fibrillation, chronic combined systolic and diastolic congestive heart failure, nonischemic cardiomyopathy status post CRT-P (11/2017), left bundle branch block, hypertension, chronic lower extremity edema, mitral valve disease status post mechanical mitral valve replacement on chronic Coumadin, s/p spontaneous left calf hematoma requiring debridement, stage III chronic kidney disease, 11/2018 mechanical fall with subsequent R hip fracture and later R hip surgery ([facility]), and seen for management and optimization of cardiac medications in the setting of the above complex medical and cardiac conditions and s/p right-sided intertrochanteric femur fracture and repair at [facility].  Assessment & Plan    Paroxysmal atrial fibrillation / Atrial Tachycardia --Feels racing heart rate --Device interrogated 8/4 and showed atrial tachycardia. Rates 100s-110s. SR baseline  ~60bpm. Suspect etiology of recurrent atrial tachycardia multifactorial, including fluid retention, anemia, recent right hip fracture repair, and UTI. --Continue amiodarone 200 twice daily. --Discontinued low dose metoprolol 8/5 and as documented in EMR after a long discussion with patient's son regarding the cardiology treatment plan and the use of beta blockers in the patient's treatment plan. Per this documentation, patient's son was informed that beta blockers are an important part of guideline directed therapy for treatment of systolic heart failure and atrial arrhythmias and that the patient was reported to have done well when on beta blockers at [place] the previous month. --Continue anticoagulation with warfarin per pharmacy and daily INR checks (target INR 2-3). As previously noted, she had two days subtherapeutic INR's (7/27, 7/28) in the setting of slow atrial tachycardia which puts her at risk for cardioembolic events, though lower than that of Afib or Aflutter. Ideally, she would be  therapeutically anticoagulated for at least 1 month prior to DCCV to reduce the risk of stroke. --Continue current plan to medically optimize patient status. Cardioversion may be required later this admission with preference to first optimize volume status with gentle diuresis and treat her underlying infection / UTI per IM. DCCV utility and timing will be assessed daily and based on careful consideration of patient risks versus benefits of the procedure.  Acute on chronic combined systolic and diastolic CHF/NICM --Patient continues to be volume up on exam today.  --Treatment complicated by comorbid conditions, including recurrent atrial tachycardia and PAF s/p mechanical MV replacement and spontaneous LE hematomas with recent R hip fracture and surgical repair. --Continue to trend BNP throughout the admission with 8/4 BNP 1,572.0. Daily BMET to assess renal function. Continue to monitor I's/O's, daily weights.    --S/p IV lasix 40mg  x1 with successful potassium repletion yesterday. I/O documented as -200cc over last 24h and catheter (visualized this AM) showed a total collection of 400cc of urine, suggesting a possible total of -600cc over the last 24h. Documented -8.9L for the admission. Wt not yet recorded for today with yesterday at 118.5lbs.  --Continue gentle diuresis with IV lasix with daily reassessment of renal function and volume status and with continued titration of diuresis as needed for optimal urine output of -500cc to -1000cc daily. Risks versus benefits of treatment with consideration of renal function will be reassessed daily and based on overall patient picture with consideration of patient self report and objective exam, vitals, and labs.  --As previously indicated, therapy should not be limited by hypotension unless below 90/50 or patient is symptomatic with lightheadedness or confusion.  --As above, discontinued low-dose beta-blocker.  We will continue holding losartan as additional evidence-based heart failure medication has been limited by hypotension at this time and in the setting of softer blood pressure.  Hypokalemia --Resolved s/p yesterday's KCl repletion. Continue to monitor.  Blood loss anemia s/p R hip surgery with R hip hematoma / tear --Monitor with daily CBC. Recommend transfusion for Hgb below 7.0.   Valvular heart disease s/p mechanical mitral valve --Continue Coumadin, INR checks. Target INR 2-3 in setting of mechanical mitral valve, PAF, and spontaneous lower extremity hematomas.  For questions or updates, please contact [place] Please consult [email or web address] for contact info under        Signed, Arvil Chaco, PA-C  02/04/2019, 8:14 AM

## 2019-02-04 NOTE — Consult Note (Deleted)
[place] for Warfarin Dosing  Indication: Mitral Valve Replacement, A. Fib    Allergies  Allergen Reactions  . Penicillins Rash    ** daughter reports facial swelling**   Has patient had a PCN reaction causing immediate rash, facial/tongue/throat swelling, SOB or lightheadedness with hypotension: YES Has patient had a PCN reaction causing severe rash involving mucus membranes or skin necrosis: NO Has patient had a PCN reaction that required hospitalization NO Has patient had a PCN reaction occurring within the last 10 years: NO If all of the above answers are "NO", then may proceed with Cephalosporin use.  . Ace Inhibitors     Cough  . Doxycycline Nausea Only  . Hydrocodone Itching and Nausea Only  . Mercury     Other reaction(s): Unknown  . Silver Dermatitis    Severe itching  . Cephalexin Rash  . Clindamycin/Lincomycin Rash  . Nickel Rash    Patient Measurements: Height: 4\' 11"  (149.9 cm) Weight: (Can't not get pt's wt.) IBW/kg (Calculated) : 43.2   Vital Signs: Temp: 97.5 F (36.4 C) (08/06 0728) Temp Source: Oral (08/06 0728) BP: 106/72 (08/06 0728) Pulse Rate: 107 (08/06 0728)  Labs: Recent Labs    02/02/19 0442 02/03/19 0446 02/04/19 0425  HGB 7.8*  --  8.2*  HCT 26.1*  --  27.3*  PLT 230  --  230  LABPROT 26.2* 24.0* 24.0*  INR 2.5* 2.2* 2.2*  CREATININE 1.57* 1.48* 1.83*    Estimated Creatinine Clearance: 18 mL/min (A) (by C-G formula based on SCr of 1.83 mg/dL (H)).   Medical History: Past Medical History:  Diagnosis Date  . Arthritis    "back, hands, knees" (04/10/2016)  . Chronic back pain   . 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 EF 25% by echo in 08/2015 b. RHC in 08/2015 showed normal filling pressures; c. 10/2018 Echo (in setting of atrial tachycardia): EF 25-30%, impaired relaxation. Glob HK. RVSP 29mmHg. Mod dil RA/LA. Mech MV w/ nl fxn (mean grad  60mmHg). Mod TR.   Marland Kitchen Chronic kidney disease   . Compression fracture    "several; all in my back" (04/10/2016)  . GERD (gastroesophageal reflux disease)   . Hypertension   . Lymphedema   . Pacemaker 05/2017  . PAF (paroxysmal atrial fibrillation) ([place])    a. On amio & Coumadin - CHA2DS2VASc = 4.  . S/P MVR (mitral valve replacement)    a. MVR 1994 b. redo MVR in 08/2014 - on Coumadin  . Shortness of breath dyspnea    Assessment: Pharmacy consulted for warfarin dosing and monitoring for 84 yo female with PMH of mitral valve replacement and A. Fib. Patient transferred to [facility] 7/20 PM from [place]. Patient's INR is sensitive to changes. We have been giving her a lower dose from her home dose. Patient may need to be discharged on 2 mg daily.   Home dose: warfarin 2.5 mg daily.   DDI: on amio (increase INR)   Spoke to MD on 7/22 regarding low Hgb- okay with continuing warfarin at lower dose.  Today's INR is subtherapeutic, likely d/t the missed dose and the effects of held/reduced doses. Will need to be conservative with boosting dose since the patient is sensitive to dose changes.    DATE INR DOSE 7/23 3.0 1 mg - did not receive.  7/24     2.7       2 mg  7/25 2.4  2 mg 7/26 2.2 2.5 mg 7/27     1.9       4 mg 7/28     1.9       3 mg  7/29 2.3 2.5 mg 7/30     2.9       2 mg 7/31     3.1       2 mg  8/1       4.0       Hold  8/2       3.3       1 mg 8/3 2.7 1 mg 8/4 2.5 1.5 mg - missed this dose 8/5 2.2 1.5 mg  8/6 2.2  Goal of Therapy:  INR 2.5-3.5 (ideally want INR between 2.5 to 3 per previous notes) Monitor platelets by anticoagulation protocol: Yes   Plan:  INR level is subtherapeutic. Will give warfarin 2 mg tonight x 1 dose.   Daily INR ordered. Hgb trending down. Continue to closely monitor CBC.   Pharmacy will continue to follow and order warfarin based on INRs  Rowland Lathe, PharmD 02/04/2019 7:47 AM

## 2019-02-04 NOTE — Progress Notes (Signed)
Patient ID: [name], female   DOB: 03-10-1935, 84 y.o.   MRN: [id number]   [facility] PROGRESS NOTE  [name] [id number] DOB: [birth year] DOA: 01/17/2019 PCP: Rusty Aus, MD  HPI/Subjective:  Patient seen and evaluated today, resting comfortably.  Denies any palpitations or chest pain.  Patient had a bowel movement today ,daughter [name] at bedside Wound VAC remains per orthopedic recommendation. Tolerating diet well,No fever On oxygen via nasal cannula at 2 L   Objective: Vitals:   02/04/19 0437 02/04/19 0728  BP: 92/64 106/72  Pulse: (!) 107 (!) 107  Resp: 20   Temp:  (!) 97.5 F (36.4 C)  SpO2: 94% 100%    Intake/Output Summary (Last 24 hours) at 02/04/2019 1447 Last data filed at 02/04/2019 0023 Gross per 24 hour  Intake 200 ml  Output 50 ml  Net 150 ml   Filed Weights   01/31/19 0900 02/01/19 0518 02/02/19 0620  Weight: 51.3 kg 47.9 kg 53.8 kg    ROS: Review of Systems  Constitutional: Negative for chills and fever.  Eyes: Negative for blurred vision.  Respiratory: Negative for cough and shortness of breath.   Cardiovascular: Negative for chest pain.  Gastrointestinal: Negative for abdominal pain, diarrhea, nausea and vomiting.  Genitourinary: Negative for dysuria.  Musculoskeletal: Positive for joint pain.  Neurological: Negative for dizziness and headaches.   Exam: Physical Exam  HENT:  Nose: No mucosal edema.  Mouth/Throat: No oropharyngeal exudate or posterior oropharyngeal edema.  Eyes: Pupils are equal, round, and reactive to light. Conjunctivae, EOM and lids are normal.  Neck: No JVD present. Carotid bruit is not present. No edema present. No thyroid mass and no thyromegaly present.  Cardiovascular: S1 normal and S2 normal.  Murmur heard.  Systolic murmur is present with a grade of 3/6. tachycardia  Respiratory: No respiratory distress. She has decreased breath sounds in the right lower field and the left lower field.  She has no wheezes. She has no rhonchi. She has no rales.  GI: Soft. Bowel sounds are normal. There is no abdominal tenderness.  Musculoskeletal:     Right ankle: She exhibits swelling.     Left ankle: She exhibits swelling.  Lymphadenopathy:    She has no cervical adenopathy.  Neurological: She is alert. No cranial nerve deficit.  Generalized weakness, moves both upper limbs - power 3/5, both lower limbs 2-3/5  Skin: Skin is warm. Nails show no clubbing.  Sacral decubitus ulcer now unstageable with some tunneling  Psychiatric: She has a normal mood and affect.   Foley catheter in place, skin lesions on leg and sacrum.  Data Reviewed: Basic Metabolic Panel: Recent Labs  Lab 01/31/19 0324 02/02/19 0442 02/03/19 0446 02/04/19 0425  NA 132* 137 138 135  K 3.7 3.4* 3.3* 4.2  CL 91* 95* 97* 96*  CO2 33* 32 32 30  GLUCOSE 94 100* 84 97  BUN 25* 27* 28* 31*  CREATININE 1.36* 1.57* 1.48* 1.83*  CALCIUM 8.7* 8.7* 8.6* 8.8*  CBC: Recent Labs  Lab 01/31/19 0324 01/31/19 0849 02/02/19 0442 02/04/19 0425  WBC 7.8  --  7.8 6.7  HGB 7.6* 8.0* 7.8* 8.2*  HCT 25.5*  --  26.1* 27.3*  MCV 96.6  --  98.5 97.5  PLT 190  --  230 230   BNP (last 3 results) Recent Labs    12/04/18 1627 12/04/18 1954 02/02/19 1047  BNP 1,189.0* 992.0* 1,572.0*     Recent Results (from the past 240  hour(s))  Urine Culture     Status: Abnormal   Collection Time: 01/31/19  2:18 PM   Specimen: Urine, Random  Result Value Ref Range Status   Specimen Description   Final    URINE, RANDOM Performed at [facility], [address], [place], [place] [address]    Special Requests   Final    NONE Performed at [facility], [address] [place], [place] [address]    Culture >=100,000 COLONIES/mL ENTEROCOCCUS FAECALIS (A)  Final   Report Status 02/02/2019 FINAL  Final   Organism ID, Bacteria ENTEROCOCCUS FAECALIS (A)  Final      Susceptibility   Enterococcus faecalis - MIC*     AMPICILLIN <=2 SENSITIVE Sensitive     LEVOFLOXACIN 0.5 SENSITIVE Sensitive     NITROFURANTOIN <=16 SENSITIVE Sensitive     VANCOMYCIN 1 SENSITIVE Sensitive     * >=100,000 COLONIES/mL ENTEROCOCCUS FAECALIS     Studies: No results found.  Scheduled Meds: . acetaminophen  650 mg Oral Once  . amiodarone  200 mg Oral BID  . collagenase   Topical Daily  . docusate sodium  100 mg Oral BID  . fosfomycin  3 g Oral Once  . mouth rinse  15 mL Mouth Rinse BID  . megestrol  200 mg Oral BID  . multivitamin-lutein  1 capsule Oral Daily  . pantoprazole  40 mg Oral Daily  . sodium chloride flush  3 mL Intravenous Q12H  . vitamin C  500 mg Oral Daily  . warfarin  2 mg Oral ONCE-1800  . Warfarin - Pharmacist Dosing Inpatient   Does not apply q1800   Continuous Infusions:   Assessment/Plan:  1. Acute on chronic combined systolic and diastolic heart failure. Continue oral Lasix patient stable .[place] cardiology is following 2. paroxysmal atrial fibrillation with some atrial tachycardia and history of mitral valve replacement on amiodarone and Coumadin- Pharmacy to manage dose for therapeutic INR.  INR at 2.2     Increased dose of amiodarone. Added Coreg by cardiology. 3. Acute blood loss anemia secondary to large right hip hematoma and skin tear right leg.  Also has other hematomas on the legs. Hemoglobin stable. Monitor.  Hemoglobin at 8.2 will repeat labs in a.m. 4. Hip fracture status post operative repair at [facility].  Physical therapy recommending rehab.  Family wants patient to go home once clinically more stable and has enough endurance and strength.  Do not want SNF placement.  Continue wound VAC, care per orhtopedics  5. Weakness. Status post physical therapy evaluation-suggested rehab placement but family does not want her to go to rehab and would like to take her home once she is able to transfer from bed to commode or wheelchair.      As patient had long hospital stays and has significant  weakness, she is still requiring 2+ assist stand up and it does not seem possible in the near future that she might be able to have that much improvement and to move without significant support. I discussed with daughter that whenever she goes home, she will need likely 24X7 care and personal aid. 6. AKI on Chronic kidney disease stage III.  With cardiorenal component and vancomycin, closely monitor with diuresis and discontinued IV vancomycin which might be the etiology for worsening of the renal function.  Patient and daughter are agreeable 7. sacral decubiti-unstageable with some tunneling.  Consult placed to Dr. Hampton Abbot bedside debridement was done , surgery will follow-up tomorrow and if  necessary they will plan for surgical debridement .consult wound care.  Encouraged to reposition patient frequently-every 1-2 hours 8. Large right hip hematoma.  Needs to be on Coumadin secondary to metallic mitral valve.  Continue to monitor.  Patient has been followed by orthopedics here .  Wound VAC placed.F/U with ortho regarding wound vac continuity as drainage has decreased. 9. Skin tear right lower extremity.  Supportive care.  Daily wound dressings and wound care team follow-up.Wound care dressings every alternate day. 10.  Altered mental status- metabolic encephalopathy -from acute cystitis      Clinically improving .UA was with some WBCs with chronic catheter. Ur cx have E. Fecalis-patient is started on IV vancomycin but changing to oral fosfomycin in view of worsening of the renal function.  Patient and daughter are agreeable Pharmacist has discussed with the patient about the medicine and adverse effects      She have PCN allergy, can not give levaquin due to amiodarone and chance of QT prolongation.11. Decreased oral intake- as per dietician started on megestrol.  Code Status: DNR    Code Status Orders  (From admission, onward)         Start     Ordered   01/19/19 0004  Do not attempt  resuscitation (DNR)  Continuous    Question Answer Comment  In the event of cardiac or respiratory ARREST Do not call a "code blue"   In the event of cardiac or respiratory ARREST Do not perform Intubation, CPR, defibrillation or ACLS   In the event of cardiac or respiratory ARREST Use medication by any route, position, wound care, and other measures to relive pain and suffering. May use oxygen, suction and manual treatment of airway obstruction as needed for comfort.      01/19/19 0003        Code Status History    Date Active Date Inactive Code Status Order ID Comments User Context   01/09/2019 2311 01/19/2019 0003 Full Code [id number]  Mayer Camel, NP Inpatient   01/04/2019 2309 01/25/2019 2311 Full Code [id number]  Mayer Camel, NP Inpatient   12/30/2018 1220 01/06/2019 0608 DNR [id number]  Ottie Glazier, MD Inpatient   12/26/2018 0147 12/30/2018 1220 Full Code [id number]  Lance Coon, MD Inpatient   12/04/2018 1937 12/15/2018 1643 Full Code [id number]  Mayer Camel, NP Inpatient   11/24/2018 1621 11/30/2018 2027 Full Code [id number]  Fritzi Mandes, MD Inpatient   11/24/2018 1557 11/24/2018 1620 DNR [id number]  Fritzi Mandes, MD Inpatient   11/09/2018 0333 11/24/2018 1557 Full Code [id number]  Mayer Camel, NP ED   11/04/2018 1650 11/08/2018 2006 Full Code [id number]  Saundra Shelling, MD Inpatient   04/10/2016 2034 04/19/2016 1757 Full Code [id number]  Vianne Bulls, MD Inpatient   09/29/2015 1053 10/10/2015 1406 Full Code [id number]  [name], MD Inpatient   04/28/2015 1626 04/30/2015 1422 Full Code [id number]  Demetrios Loll, MD Inpatient   Advance Care Planning Activity    Advance Directive Documentation     Most Recent Value  Type of Advance Directive  Out of facility DNR (pink MOST or yellow form)  Pre-existing out of facility DNR order (yellow form or pink MOST form)  Yellow form placed in chart (order not valid for inpatient use)  "MOST" Form in Place?  -     Family Communication:  Family updated- spoke to her daughter [name] at bedside .  Agreeable with current plan of care.   Disposition Plan: To be determined based  on clinical course  Time spent: 13 minutes  [employer]

## 2019-02-04 NOTE — Progress Notes (Signed)
Physical Therapy Treatment Patient Details Name: [name] MRN: [id number] DOB: [birth year] Today's Date: 02/04/2019    History of Present Illness  84 y.o. female with a known history of right hip repair at [facility] on 01/11/2019 secondary to mechanical fall at home, paroxysmal atrial fibrillation, mitral valve replacement on Coumadin, COPD, CKD stage III, hypertension, history of left lower extremity hematoma with evacuation and porcine graft performed on 12/25/2018.    PT Comments    Pt presents with deficits in strength, transfers, mobility, gait, balance, and activity tolerance.  Pt continues to require significant encouragement to participate during the session.  Pt required +2 max A with bed mobility tasks and +2 max A with transfers including physical assistance for BLE positioning prior to standing.  Pt was able to take several small, effortful steps with mostly CGA and occasional min A for stability and min A to advance the RW before requiring to return to sitting.  Pt will benefit from PT services in a SNF setting upon discharge to safely address above deficits for decreased caregiver assistance and eventual return to PLOF.     Follow Up Recommendations  SNF     Equipment Recommendations  Other (comment)(TBD at next venue of care)    Recommendations for Other Services       Precautions / Restrictions Precautions Precautions: Fall Precaution Comments: fragile skin, sacral ulcer Restrictions Weight Bearing Restrictions: No RLE Weight Bearing: Weight bearing as tolerated    Mobility  Bed Mobility Overal bed mobility: Needs Assistance Bed Mobility: Supine to Sit     Supine to sit: Max assist;+2 for physical assistance     General bed mobility comments: Mod verbal cues for sequencing with pt actively participating but physically unable to provide much active assistance  Transfers Overall transfer level: Needs assistance Equipment used: Rolling walker (2  wheeled) Transfers: Sit to/from Stand Sit to Stand: +2 physical assistance;Max assist         General transfer comment: Physical assistance getting pt's BLEs in place and max verbal and tactile cues for sequencing  Ambulation/Gait Ambulation/Gait assistance: +2 physical assistance;Min assist Gait Distance (Feet): 3 Feet Assistive device: Rolling walker (2 wheeled) Gait Pattern/deviations: Step-to pattern;Decreased step length - right;Decreased step length - left;Trunk flexed Gait velocity: decreased   General Gait Details: Pt able to take several small, effortful steps with mostly CGA and occasional min A for stability and min A to advance the RW   Stairs             Wheelchair Mobility    Modified Rankin (Stroke Patients Only)       Balance Overall balance assessment: Needs assistance Sitting-balance support: Bilateral upper extremity supported   Sitting balance - Comments: Pt able to maintain static sitting position at the EOB without physical assistance   Standing balance support: Bilateral upper extremity supported;During functional activity Standing balance-Leahy Scale: Poor Standing balance comment: Pt requires occasional min A for stability with a RW                            Cognition Arousal/Alertness: Awake/alert Behavior During Therapy: WFL for tasks assessed/performed Overall Cognitive Status: Within Functional Limits for tasks assessed Area of Impairment: Following commands                                      Exercises Total Joint  Exercises Ankle Circles/Pumps: Strengthening;Both;5 reps;10 reps Quad Sets: Strengthening;Both;5 reps;10 reps Gluteal Sets: Strengthening;Both;5 reps Heel Slides: AAROM;Both;5 reps Hip ABduction/ADduction: AAROM;Both;10 reps Straight Leg Raises: AAROM;Both;10 reps Other Exercises Other Exercises: Pt education provided on physiological benefits of activity and up in chair to encourage  participation    General Comments        Pertinent Vitals/Pain Pain Assessment: 0-10 Pain Score: 10-Worst pain ever Faces Pain Scale: Hurts little more Pain Location: Back pain Pain Descriptors / Indicators: Aching;Sore Pain Intervention(s): Premedicated before session;Monitored during session    Home Living                      Prior Function            PT Goals (current goals can now be found in the care plan section) Acute Rehab PT Goals Patient Stated Goal: start doing more for myself again    Frequency    Min 2X/week      PT Plan Current plan remains appropriate    Co-evaluation              AM-PAC PT "6 Clicks" Mobility   Outcome Measure  Help needed turning from your back to your side while in a flat bed without using bedrails?: A Lot Help needed moving from lying on your back to sitting on the side of a flat bed without using bedrails?: A Lot Help needed moving to and from a bed to a chair (including a wheelchair)?: A Lot Help needed standing up from a chair using your arms (e.g., wheelchair or bedside chair)?: A Lot Help needed to walk in hospital room?: Total Help needed climbing 3-5 steps with a railing? : Total 6 Click Score: 10    End of Session Equipment Utilized During Treatment: Gait belt;Oxygen Activity Tolerance: Patient tolerated treatment well Patient left: in chair;with call bell/phone within reach;with chair alarm set Nurse Communication: Mobility status PT Visit Diagnosis: Muscle weakness (generalized) (M62.81);Difficulty in walking, not elsewhere classified (R26.2);Pain Pain - Right/Left: Right Pain - part of body: Hip     Time: [id number] PT Time Calculation (min) (ACUTE ONLY): 25 min  Charges:  $Therapeutic Exercise: 8-22 mins $Therapeutic Activity: 8-22 mins                     D. Scott Kadi Hession PT, DPT 02/04/19, 4:33 PM

## 2019-02-04 NOTE — Consult Note (Signed)
Pharmacy Antibiotic Note  [name] is a 84 y.o. female admitted on 01/04/2019 with UTI.  Pharmacy has been consulted for Vancomycin dosing. Patient has a Foley catheter and there were concerns for a UTI d/t UA. Ucx came back for ENTEROCOCCUS FAECALIS- which was pan-sensitive. Allergy to PCN.   Scr 1.57 >> 1.48 >> 1.83 (today)  Patient has had two doses of Vancomycin and renal function is worsening, may need to assess utility of medication moving forward.   Plan: Discussed during rounds the utility of Vancomycin due to the change in the patient's renal function.   Discussed, with the patient's Daughter, the concern for using Vancomycin as it relates to her renal function. Discussed re-trying fosfomycin as it is a drug that covers the organism that was grown in the urine culture- just as Vancomycin does. I also emphasized, that since [name] has received two doses of Vancomycin IV, the transition back to fosfomycin is a viable option. I explained that fosfomycin would be a one-time dose. The patient's daughter was agreeable to starting fosfomycin as a one-time dose. Dr. Margaretmary Eddy confirmed discontinuing Vancomycin and starting Fosfomycin.    Height: 4\' 11"  (149.9 cm) Weight: (Can't not get pt's wt.) IBW/kg (Calculated) : 43.2  Temp (24hrs), Avg:97.7 F (36.5 C), Min:97.5 F (36.4 C), Max:97.9 F (36.6 C)  Recent Labs  Lab 01/31/19 0324 02/02/19 0442 02/03/19 0446 02/03/19 1607 02/04/19 0425  WBC 7.8 7.8  --   --  6.7  CREATININE 1.36* 1.57* 1.48*  --  1.83*  VANCORANDOM  --   --   --  12  --     Estimated Creatinine Clearance: 18 mL/min (A) (by C-G formula based on SCr of 1.83 mg/dL (H)).    Allergies  Allergen Reactions  . Penicillins Rash    ** daughter reports facial swelling**   Has patient had a PCN reaction causing immediate rash, facial/tongue/throat swelling, SOB or lightheadedness with hypotension: YES Has patient had a PCN reaction causing severe rash  involving mucus membranes or skin necrosis: NO Has patient had a PCN reaction that required hospitalization NO Has patient had a PCN reaction occurring within the last 10 years: NO If all of the above answers are "NO", then may proceed with Cephalosporin use.  . Ace Inhibitors     Cough  . Doxycycline Nausea Only  . Hydrocodone Itching and Nausea Only  . Mercury     Other reaction(s): Unknown  . Silver Dermatitis    Severe itching  . Cephalexin Rash  . Clindamycin/Lincomycin Rash  . Nickel Rash    Antimicrobials this admission: 8/4 Fosfomycin x 1 (partial dose administered to the patient per nurse) 8/4 vancomycin >>   Microbiology results: 8/2 UCx: ENTEROCOCCUS FAECALIS   Thank you for allowing pharmacy to be a part of this patient's care.  Rowland Lathe, PharmD 02/04/2019 12:45 PM

## 2019-02-05 LAB — BASIC METABOLIC PANEL
Anion gap: 12 (ref 5–15)
BUN: 34 mg/dL — ABNORMAL HIGH (ref 8–23)
CO2: 28 mmol/L (ref 22–32)
Calcium: 8.8 mg/dL — ABNORMAL LOW (ref 8.9–10.3)
Chloride: 94 mmol/L — ABNORMAL LOW (ref 98–111)
Creatinine, Ser: 1.81 mg/dL — ABNORMAL HIGH (ref 0.44–1.00)
GFR calc Af Amer: 30 mL/min — ABNORMAL LOW (ref >60)
GFR calc non Af Amer: 26 mL/min — ABNORMAL LOW (ref >60)
Glucose, Bld: 90 mg/dL (ref 70–99)
Potassium: 3.8 mmol/L (ref 3.5–5.1)
Sodium: 134 mmol/L — ABNORMAL LOW (ref 135–145)

## 2019-02-05 LAB — CBC
HCT: 27.5 % — ABNORMAL LOW (ref 36.0–46.0)
Hemoglobin: 8.1 g/dL — ABNORMAL LOW (ref 12.0–15.0)
MCH: 28.6 pg (ref 26.0–34.0)
MCHC: 29.5 g/dL — ABNORMAL LOW (ref 30.0–36.0)
MCV: 97.2 fL (ref 80.0–100.0)
Platelets: 247 10*3/uL (ref 150–400)
RBC: 2.83 MIL/uL — ABNORMAL LOW (ref 3.87–5.11)
RDW: 19.2 % — ABNORMAL HIGH (ref 11.5–15.5)
WBC: 7.6 10*3/uL (ref 4.0–10.5)
nRBC: 0 % (ref 0.0–0.2)

## 2019-02-05 LAB — PROTIME-INR
INR: 2.1 — ABNORMAL HIGH (ref 0.8–1.2)
Prothrombin Time: 23.2 seconds — ABNORMAL HIGH (ref 11.4–15.2)

## 2019-02-05 LAB — BRAIN NATRIURETIC PEPTIDE: B Natriuretic Peptide: 1492 pg/mL — ABNORMAL HIGH (ref 0.0–100.0)

## 2019-02-05 MED ORDER — TRAMADOL HCL 50 MG PO TABS
50.0000 mg | ORAL_TABLET | Freq: Four times a day (QID) | ORAL | Status: DC | PRN
  Administered 2019-02-05 – 2019-02-12 (×11): 50 mg via ORAL
  Filled 2019-02-05 (×11): qty 1

## 2019-02-05 MED ORDER — FUROSEMIDE 40 MG PO TABS
40.0000 mg | ORAL_TABLET | Freq: Every day | ORAL | Status: DC
  Administered 2019-02-05 – 2019-02-06 (×2): 40 mg via ORAL
  Filled 2019-02-05 (×2): qty 1

## 2019-02-05 MED ORDER — WARFARIN SODIUM 2.5 MG PO TABS
2.5000 mg | ORAL_TABLET | Freq: Once | ORAL | Status: AC
  Administered 2019-02-05: 2.5 mg via ORAL
  Filled 2019-02-05: qty 1

## 2019-02-05 MED ORDER — OXYCODONE HCL 5 MG PO TABS
2.5000 mg | ORAL_TABLET | Freq: Four times a day (QID) | ORAL | Status: DC | PRN
  Administered 2019-02-07: 2.5 mg via ORAL
  Filled 2019-02-05: qty 1

## 2019-02-05 NOTE — Consult Note (Signed)
WOC will not consult today on this patient, she was evaluated by surgery and images were taken. No other topical wound care changes indicated at this time. They agreed with current regimen with enzymatic debridement, low air loss mattress, optimize nutrition for wound healing.     Re consult if needed, will not follow at this time. Thanks  Hanan Mcwilliams [employer], RN,CWOCN, CNS, [place] [phone])

## 2019-02-05 NOTE — Progress Notes (Signed)
Spoke with patients son, Helene Kelp, who is very upset about the management of his mothers heart rate. He is concerned that her "high HR" is causing her infection to spread faster. He wants his mother to be cardioverted, as opposed to medical management. He is requesting that cardiologist call him when they round. Note left in physician sticky note section, oncoming nurse will be made aware as well.   Will continue to monitor.   Iran Sizer M

## 2019-02-05 NOTE — Progress Notes (Signed)
Progress Note  Patient Name: [name] Date of Encounter: 02/05/2019  Primary Cardiologist: Ida Rogue, MD   Subjective   She did not sleep well last night.  She reported chest discomfort this morning; however, on further questioning, she clarified that this is feeling her heart race.  She stated that she is miserable overnight due to this increased heart rate.  She reported some urine output; however, on examination of cath bag, only a scant amount of dark urine present (I/O, weights not well documented).  She stated that her shortness of breath with stable and unchanged from yesterday.  Of note, she appeared less oriented today yesterday morning, often referencing events that happened before her hospitalization as if they occurred last night or yesterday.  She stated her main complaint was feeling her heart race.  Inpatient Medications    Scheduled Meds:  acetaminophen  650 mg Oral Once   amiodarone  200 mg Oral BID   collagenase   Topical Daily   docusate sodium  100 mg Oral BID   mouth rinse  15 mL Mouth Rinse BID   megestrol  200 mg Oral BID   multivitamin-lutein  1 capsule Oral Daily   pantoprazole  40 mg Oral Daily   sodium chloride flush  3 mL Intravenous Q12H   vitamin C  500 mg Oral Daily   Warfarin - Pharmacist Dosing Inpatient   Does not apply q1800   Continuous Infusions:  PRN Meds: acetaminophen, acetaminophen, calcium carbonate, ipratropium-albuterol, liver oil-zinc oxide, Melatonin, menthol-cetylpyridinium **OR** phenol, metoCLOPramide **OR** metoCLOPramide (REGLAN) injection, nystatin, ondansetron **OR** ondansetron (ZOFRAN) IV, oxyCODONE   Vital Signs    Vitals:   02/04/19 0728 02/04/19 2013 02/05/19 0413 02/05/19 0723  BP: 106/72 97/68 99/70  109/69  Pulse: (!) 107 (!) 106 (!) 107 (!) 105  Resp:  20 20   Temp: (!) 97.5 F (36.4 C) 98 F (36.7 C) 98.1 F (36.7 C) 97.7 F (36.5 C)  TempSrc: Oral Oral Oral Oral  SpO2: 100% 91%  100% 98%  Weight:      Height:        Intake/Output Summary (Last 24 hours) at 02/05/2019 0807 Last data filed at 02/05/2019 0600 Gross per 24 hour  Intake --  Output 5 ml  Net -5 ml   Last 3 Weights 02/03/2019 02/02/2019 02/01/2019  Weight (lbs) (No Data) 118 lb 8 oz 105 lb 9.6 oz  Weight (kg) (No Data) 53.751 kg 47.9 kg  Some encounter information is confidential and restricted. Go to Review Flowsheets activity to see all data.      Telemetry    V paced, low 100s bpm- Personally Reviewed  ECG    No new tracings- Personally Reviewed  Physical Exam   GEN:  Frail and elderly female.   Neck:  JVP ~11-12cm Cardiac:  Tachycardic but regular, 2/6 systolic murmur. Softer heart sounds. No rubs, or gallops.  Respiratory: Poor inspiratory effort with bilaterally diminished breath sounds as auscultated anteriorly as patient is too weak for posterior auscultation GI: Soft, nontender, non-distended  MS: 1-2+ bilateral dependent edema of thighs, b/l lower extremities wrapped in bandages; wound vac with dark fluid draining. No deformity. Neuro:  Nonfocal - somewhat disoriented in regards to timeline of events Psych: Normal affect   Labs    High Sensitivity Troponin:  No results for input(s): TROPONINIHS in the last 720 hours.    Cardiac EnzymesNo results for input(s): TROPONINI in the last 168 hours. No results for input(s): TROPIPOC in  the last 168 hours.   Chemistry Recent Labs  Lab 02/03/19 0446 02/04/19 0425 02/05/19 0556  NA 138 135 134*  K 3.3* 4.2 3.8  CL 97* 96* 94*  CO2 32 30 28  GLUCOSE 84 97 90  BUN 28* 31* 34*  CREATININE 1.48* 1.83* 1.81*  CALCIUM 8.6* 8.8* 8.8*  GFRNONAA 33* 25* 26*  GFRAA 38* 29* 30*  ANIONGAP 9 9 12      Hematology Recent Labs  Lab 02/02/19 0442 02/04/19 0425 02/05/19 0556  WBC 7.8 6.7 7.6  RBC 2.65* 2.80* 2.83*  HGB 7.8* 8.2* 8.1*  HCT 26.1* 27.3* 27.5*  MCV 98.5 97.5 97.2  MCH 29.4 29.3 28.6  MCHC 29.9* 30.0 29.5*  RDW 19.8* 19.5*  19.2*  PLT 230 230 247    BNP Recent Labs  Lab 02/02/19 1047  BNP 1,572.0*     DDimer No results for input(s): DDIMER in the last 168 hours.   Radiology    No results found.  Cardiac Studies   TTE 11/05/2018 1. The left ventricle has severely reduced systolic function, with an ejection fraction of 25-30%. The cavity size was mildly dilated. Left ventricular diastolic Doppler parameters are consistent with impaired relaxation. Left ventrical global  hypokinesis without regional wall motion abnormalities. 2. The right ventricle has mildly reduced systolic function. The cavity was moderately enlarged. There is no increase in right ventricular wall thickness. Right ventricular systolic pressure is moderately elevated with an estimated pressure of 53 mmHg. 3. Right atrial size was moderately dilated. 4. Left atrial size was moderately dilated. 5. The aortic valve was not well visualized. Moderate calcification of the aortic valve. Aortic valve regurgitation was not assessed by color flow Doppler. Unable to estimate adegree of aortic valve stenosis given severely reduced systolic function. 6. Mechanical mitral valve. Mean gradient 5 mm Hg. 7. Tricuspid valve regurgitation is moderate. 8. Left pleural effusion noted, 6.5 cm 9. Rhythm is atrial tachycardia, rate 105 bpm  Patient Profile     84 y.o. female with a hx of atrial fibrillation, chronic combined systolic and diastolic congestive heart failure, nonischemic cardiomyopathy status post CRT-P (11/2017), left bundle branch block, hypertension, chronic lower extremity edema, mitral valve disease status post mechanical mitral valve replacement on chronic Coumadin, s/p spontaneous left calf hematoma requiring debridement, stage III chronic kidney disease, 11/2018 mechanical fall with subsequent R hip fracture and later R hip surgery ([facility]), and seen for management and optimization of cardiac medications in the setting of the above  complex medical and cardiac conditions and s/p right-sided intertrochanteric femur fracture and repair at [facility]  Assessment & Plan    Paroxysmal atrial fibrillation / Atrial Tachycardia --Feels racing heart rate and stated this is interfering with her sleep. --Device interrogated 8/4 and showed atrial tachycardia. Etiology of recurrent atrial tachycardia likely multifactorial, including fluid retention, anemia, recent right hip fracture repair, and UTI. --Continue amiodarone 200 twice daily. Per son's request, low dose metoprolol discontinued 8/5 and as documented in EMR. Given beta blockers are an important part of guideline directed therapy for treatment of systolic heart failure and atrial arrhythmias, will plan to readdress with family throughout admission. --Continue anticoagulation with warfarin per pharmacy and daily INR checks (target INR 2-3). Due to two days subtherapeutic INR's (7/27, 7/28), and given patient's comorbid conditions, DCCV is less ideal, though reportedly lower risk for cardioembolic events in slow atrial tachycardia than that of Afib/flutter. Preference would be for patient to be therapeutically anticoagulated for at least 1 month prior  to DCCV. --Continue current plan to medically optimize patient status. Will readdress BB daily. Cardioversion may be required later this admission with preference to first optimize volume status with gentle diuresis. DCCV utility and timing will be assessed daily and based on careful consideration of patient risks versus benefits of the procedure.  Acute on chronic combined systolic and diastolic CHF/NICM --Patient continues to be volume up on exam today.  --Treatment has been complicated by comorbid conditions, including recurrent atrial tachycardia and PAF s/p mechanical MV replacement and spontaneous LE hematomas with recent R hip fracture and surgical repair. --Continue to trend BNP throughout the admission. Daily BMET to assess renal  function. Continue to monitor I's/O's, daily weights.  Renal function without significant improvement from yesterday and after holding yesterday's diuresis. Cr 1.83  1.81 with BUN 31  34. Scant and dark urine output overnight. Weights and output not well documented. --Titration of gentle diuresis should be reassessed daily based on volume status, urine output, and labs/ renal function with careful consideration of patient self report and objective exam, vitals, and labs.  --Will recheck BNP today for additional guidance regarding decision for restart of diuresis today. 8/4 BNP of 1,572.0. In addition to holding yesterday's lasix, abx was changed yesterday to reduce the burden on kidneys, and we will continue to monitor for evidence of Cr improvement after this change.  --As previously indicated, therapy should not be limited by hypotension unless below 90/50 or patient is symptomatic with lightheadedness or confusion. Patient does appear somewhat more disoriented this morning and will plan to recheck later on in the day in the event that she improves with more time s/p waking for the day. Continue holding losartan.  Blood loss anemia s/p R hip surgery with R hip hematoma / tear --Monitor with daily CBC. Recommend transfusion for Hgb below 7.0.   Valvular heart disease s/p mechanical mitral valve --Continue Coumadin, INR checks. Target INR 2-3 (currently 2.1)  in setting of mechanical mitral valve, PAF, and spontaneous lower extremity hematomas.  For questions or updates, please contact [place] Please consult [email or web address] for contact info under        Signed, Arvil Chaco, PA-C  02/05/2019, 8:07 AM

## 2019-02-05 NOTE — Consult Note (Addendum)
[facility] for Warfarin Dosing  Indication: Mitral Valve Replacement, A. Fib    Allergies  Allergen Reactions  . Penicillins Rash    ** daughter reports facial swelling**   Has patient had a PCN reaction causing immediate rash, facial/tongue/throat swelling, SOB or lightheadedness with hypotension: YES Has patient had a PCN reaction causing severe rash involving mucus membranes or skin necrosis: NO Has patient had a PCN reaction that required hospitalization NO Has patient had a PCN reaction occurring within the last 10 years: NO If all of the above answers are "NO", then may proceed with Cephalosporin use.  . Ace Inhibitors     Cough  . Doxycycline Nausea Only  . Hydrocodone Itching and Nausea Only  . Mercury     Other reaction(s): Unknown  . Silver Dermatitis    Severe itching  . Cephalexin Rash  . Clindamycin/Lincomycin Rash  . Nickel Rash    Patient Measurements: Height: 4\' 11"  (149.9 cm) Weight: (Can't not get pt's wt.) IBW/kg (Calculated) : 43.2   Vital Signs: Temp: 97.7 F (36.5 C) (08/07 0723) Temp Source: Oral (08/07 0723) BP: 109/69 (08/07 0723) Pulse Rate: 105 (08/07 0723)  Labs: Recent Labs    02/03/19 0446 02/04/19 0425 02/05/19 0556  HGB  --  8.2* 8.1*  HCT  --  27.3* 27.5*  PLT  --  230 247  LABPROT 24.0* 24.0* 23.2*  INR 2.2* 2.2* 2.1*  CREATININE 1.48* 1.83* 1.81*    Estimated Creatinine Clearance: 18.2 mL/min (A) (by C-G formula based on SCr of 1.81 mg/dL (H)).   Medical History: Past Medical History:  Diagnosis Date  . Arthritis    "back, hands, knees" (04/10/2016)  . Chronic back pain   . 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 EF 25% by echo in 08/2015 b. RHC in 08/2015 showed normal filling pressures; c. 10/2018 Echo (in setting of atrial tachycardia): EF 25-30%, impaired relaxation. Glob HK. RVSP 63mmHg. Mod dil RA/LA. Mech MV w/ nl fxn (mean  grad 1mmHg). Mod TR.   Marland Kitchen Chronic kidney disease   . Compression fracture    "several; all in my back" (04/10/2016)  . GERD (gastroesophageal reflux disease)   . Hypertension   . Lymphedema   . Pacemaker 05/2017  . PAF (paroxysmal atrial fibrillation) ([facility])    a. On amio & Coumadin - CHA2DS2VASc = 4.  . S/P MVR (mitral valve replacement)    a. MVR 1994 b. redo MVR in 08/2014 - on Coumadin  . Shortness of breath dyspnea    Assessment: Pharmacy consulted for warfarin dosing and monitoring for 84 yo female with PMH of mitral valve replacement and A. Fib. Patient transferred to [facility] 7/20 PM from [place]. Patient's INR is sensitive to changes. We have been giving her a lower dose from her home dose. Patient may need to be discharged on 2 mg daily.   Home dose: warfarin 2.5 mg daily.   DDI: on amio (increase INR); megace started 8/5 (may increase risk of bleeding/INR)  Spoke to MD on 7/22 regarding low Hgb- okay with continuing warfarin at lower dose.  Today's INR is subtherapeutic, likely d/t the missed dose and the affects of the held/reduced doses. Will need to be conservative with boosting dose since the patient is sensitive to dose changes. Of note, patient had bedside debridement yesterday but may need debridement in the OR. Additionally, patient's son opts for DCCV, but patient has not  been therapeutically anticoagulated during this admission. Per cardio, patient needs to be therapeutically anticoagulated for 1 month prior to considering DCCV.   DATE INR DOSE 7/23 3.0 1 mg - did not receive.  7/24     2.7       2 mg  7/25 2.4 2 mg 7/26 2.2 2.5 mg 7/27     1.9       4 mg 7/28     1.9       3 mg  7/29 2.3 2.5 mg 7/30     2.9       2 mg 7/31     3.1       2 mg  8/1       4.0       Hold  8/2       3.3       1 mg 8/3 2.7 1 mg 8/4 2.5 1.5 mg - missed this dose 8/5 2.2 1.5 mg  8/6 2.2 2 mg  8/7 2.1   Goal of Therapy:  INR 2.5-3.5 (ideally want INR between 2.5 to 3 per  previous notes) Monitor platelets by anticoagulation protocol: Yes   Plan:  INR level is subtherapeutic. Will give warfarin 2.5 mg tonight x 1 dose.   Daily INR ordered. Hgb trending down. Continue to closely monitor CBC.   Pharmacy will continue to follow and order warfarin based on INRs  Rowland Lathe, PharmD 02/05/2019 8:19 AM

## 2019-02-05 NOTE — Progress Notes (Addendum)
Pt BP at 95/68 Hr 107 and have a scheduled amiodarone 200 mg schedule at this time. Notify prime. Will continue to monitor.  Update 2303: Dr. Jannifer Franklin ordered to give meds and recheck BP after 1 hour. Will continue to monitor.  Update 0019 t BP at 97/73 HR 102. Notified prime. Will continue to monitor.

## 2019-02-05 NOTE — Progress Notes (Signed)
Patient ID: [name], female   DOB: 1935/01/09, 84 y.o.   MRN: [id number]   [facility] PROGRESS NOTE  [name] [id number] DOB: 1935-06-29 DOA: 01/14/2019 PCP: Rusty Aus, MD  HPI/Subjective:  Patient seen and evaluated today, resting comfortably in the bed , daughter is concerned as patient is having pressure ulcers and not moving much , requesting to discontinue Megace and Colace .  Patient had a bowel movement today ,daughter [name] at bedside Wound VAC remains per orthopedic recommendation. Tolerating diet well,No fever On oxygen via nasal cannula at 2 L   Objective: Vitals:   02/05/19 0413 02/05/19 0723  BP: 99/70 109/69  Pulse: (!) 107 (!) 105  Resp: 20   Temp: 98.1 F (36.7 C) 97.7 F (36.5 C)  SpO2: 100% 98%    Intake/Output Summary (Last 24 hours) at 02/05/2019 1626 Last data filed at 02/05/2019 0600 Gross per 24 hour  Intake -  Output 5 ml  Net -5 ml   Filed Weights   01/31/19 0900 02/01/19 0518 02/02/19 0620  Weight: 51.3 kg 47.9 kg 53.8 kg    ROS: Review of Systems  Constitutional: Negative for chills and fever.  Eyes: Negative for blurred vision.  Respiratory: Negative for cough and shortness of breath.   Cardiovascular: Negative for chest pain.  Gastrointestinal: Negative for abdominal pain, diarrhea, nausea and vomiting.  Genitourinary: Negative for dysuria.  Musculoskeletal: Positive for joint pain.  Neurological: Negative for dizziness and headaches.   Exam: Physical Exam  HENT:  Nose: No mucosal edema.  Mouth/Throat: No oropharyngeal exudate or posterior oropharyngeal edema.  Eyes: Pupils are equal, round, and reactive to light. Conjunctivae, EOM and lids are normal.  Neck: No JVD present. Carotid bruit is not present. No edema present. No thyroid mass and no thyromegaly present.  Cardiovascular: S1 normal and S2 normal.  Murmur heard.  Systolic murmur is present with a grade of 3/6. tachycardia  Respiratory: No  respiratory distress. She has decreased breath sounds in the right lower field and the left lower field. She has no wheezes. She has no rhonchi. She has no rales.  GI: Soft. Bowel sounds are normal. There is no abdominal tenderness.  Musculoskeletal:     Right ankle: She exhibits swelling.     Left ankle: She exhibits swelling.  Lymphadenopathy:    She has no cervical adenopathy.  Neurological: She is alert. No cranial nerve deficit.  Generalized weakness, moves both upper limbs - power 3/5, both lower limbs 2-3/5  Skin: Skin is warm. Nails show no clubbing.  Sacral decubitus ulcer now unstageable with some tunneling  Psychiatric: She has a normal mood and affect.   Foley catheter in place, skin lesions on leg and sacrum.  Data Reviewed: Basic Metabolic Panel: Recent Labs  Lab 01/31/19 0324 02/02/19 0442 02/03/19 0446 02/04/19 0425 02/05/19 0556  NA 132* 137 138 135 134*  K 3.7 3.4* 3.3* 4.2 3.8  CL 91* 95* 97* 96* 94*  CO2 33* 32 32 30 28  GLUCOSE 94 100* 84 97 90  BUN 25* 27* 28* 31* 34*  CREATININE 1.36* 1.57* 1.48* 1.83* 1.81*  CALCIUM 8.7* 8.7* 8.6* 8.8* 8.8*  CBC: Recent Labs  Lab 01/31/19 0324 01/31/19 0849 02/02/19 0442 02/04/19 0425 02/05/19 0556  WBC 7.8  --  7.8 6.7 7.6  HGB 7.6* 8.0* 7.8* 8.2* 8.1*  HCT 25.5*  --  26.1* 27.3* 27.5*  MCV 96.6  --  98.5 97.5 97.2  PLT 190  --  230 230 247   BNP (last 3 results) Recent Labs    12/04/18 1954 02/02/19 1047 02/05/19 0556  BNP 992.0* 1,572.0* 1,492.0*     Recent Results (from the past 240 hour(s))  Urine Culture     Status: Abnormal   Collection Time: 01/31/19  2:18 PM   Specimen: Urine, Random  Result Value Ref Range Status   Specimen Description   Final    URINE, RANDOM Performed at [facility], [address] [place], [place] [address]    Special Requests   Final    NONE Performed at [facility], [address] [place], [place] [address]    Culture >=100,000  COLONIES/mL ENTEROCOCCUS FAECALIS (A)  Final   Report Status 02/02/2019 FINAL  Final   Organism ID, Bacteria ENTEROCOCCUS FAECALIS (A)  Final      Susceptibility   Enterococcus faecalis - MIC*    AMPICILLIN <=2 SENSITIVE Sensitive     LEVOFLOXACIN 0.5 SENSITIVE Sensitive     NITROFURANTOIN <=16 SENSITIVE Sensitive     VANCOMYCIN 1 SENSITIVE Sensitive     * >=100,000 COLONIES/mL ENTEROCOCCUS FAECALIS     Studies: No results found.  Scheduled Meds: . acetaminophen  650 mg Oral Once  . amiodarone  200 mg Oral BID  . collagenase   Topical Daily  . furosemide  40 mg Oral Daily  . mouth rinse  15 mL Mouth Rinse BID  . multivitamin-lutein  1 capsule Oral Daily  . pantoprazole  40 mg Oral Daily  . sodium chloride flush  3 mL Intravenous Q12H  . vitamin C  500 mg Oral Daily  . warfarin  2.5 mg Oral ONCE-1800  . Warfarin - Pharmacist Dosing Inpatient   Does not apply q1800   Continuous Infusions:   Assessment/Plan:  1. Acute on chronic combined systolic and diastolic heart failure. Continue oral Lasix patient stable .[place] cardiology is following 2. paroxysmal atrial fibrillation with some atrial tachycardia and history of mitral valve replacement on amiodarone and Coumadin- Pharmacy to manage dose for therapeutic INR.  INR at 2.1.  Patient on amiodarone discontinued beta-blocker in view of dizzy spells in the past    3. Acute blood loss anemia secondary to large right hip hematoma and skin tear right leg.  Also has other hematomas on the legs. Hemoglobin stable. Monitor.  Hemoglobin at 8.2 --8.1 will repeat labs in a.m. 4. Hip fracture status post operative repair at [facility].  Physical therapy recommending rehab.  Family wants patient to go home once clinically more stable and has enough endurance and strength.  Do not want SNF placement.  Continue wound VAC, care per orhtopedics  5. Weakness. Status post physical therapy evaluation-suggested rehab placement but family does not want her to  go to rehab and would like to take her home once she is able to transfer from bed to commode or wheelchair.  Agreeable to provide LTAC information    As patient had long hospital stays and has significant weakness, she is still requiring 2+ assist stand up and it does not seem possible in the near future that she might be able to have that much improvement and to move without significant support. I discussed with daughter that whenever she goes home, she will need likely 24X7 care and personal aid. 6. AKI on Chronic kidney disease stage III.  With cardiorenal component and vancomycin, closely monitor with diuresis and discontinued IV vancomycin which might be the etiology for worsening of the renal function.  Patient and daughter are agreeable creatinine 1.48-1.83-1.81 7. sacral decubiti-unstageable with some tunneling.  Consult placed to Dr. Hampton Abbot bedside debridement was done , surgery has reassessed the patient and no further debridement needed.  wound care.  Encouraged to reposition patient frequently-every 1-2 hours 8. Large right hip hematoma.  Needs to be on Coumadin secondary to metallic mitral valve.  Continue to monitor.  Patient has been followed by orthopedics here .  Wound VAC placed.F/U with ortho regarding wound vac continuity as drainage has decreased. 9. Skin tear right lower extremity.  Supportive care.  Daily wound dressings and wound care team follow-up.Wound care dressings every alternate day. 10.  Altered mental status- metabolic encephalopathy -from acute cystitis      Clinically improving .UA was with some WBCs with chronic catheter. Ur cx have E. Fecalis-patient is started on IV vancomycin but changing to oral fosfomycin in view of worsening of the renal function.  Patient and daughter are agreeable Pharmacist has discussed with the patient about the medicine and adverse effects      She have PCN allergy, can not give levaquin due to amiodarone and chance of QT prolongation.11.  Decreased oral intake- as per dietician started on megestrol.  Daughter has requested to discontinue Megace as patient is eating okay  Education officer, museum will provide LTAC information  Code Status: DNR    Code Status Orders  (From admission, onward)         Start     Ordered   01/19/19 0004  Do not attempt resuscitation (DNR)  Continuous    Question Answer Comment  In the event of cardiac or respiratory ARREST Do not call a "code blue"   In the event of cardiac or respiratory ARREST Do not perform Intubation, CPR, defibrillation or ACLS   In the event of cardiac or respiratory ARREST Use medication by any route, position, wound care, and other measures to relive pain and suffering. May use oxygen, suction and manual treatment of airway obstruction as needed for comfort.      01/19/19 0003        Code Status History    Date Active Date Inactive Code Status Order ID Comments User Context   01/12/2019 2311 01/19/2019 0003 Full Code [id number]  Mayer Camel, NP Inpatient   01/06/2019 2309 01/07/2019 2311 Full Code [id number]  Mayer Camel, NP Inpatient   12/30/2018 1220 01/06/2019 0608 DNR [id number]  Ottie Glazier, MD Inpatient   12/26/2018 0147 12/30/2018 1220 Full Code [id number]  Lance Coon, MD Inpatient   12/04/2018 1937 12/15/2018 1643 Full Code [id number]  Mayer Camel, NP Inpatient   11/24/2018 1621 11/30/2018 2027 Full Code [id number]  Fritzi Mandes, MD Inpatient   11/24/2018 1557 11/24/2018 1620 DNR [id number]  Fritzi Mandes, MD Inpatient   11/09/2018 0333 11/24/2018 1557 Full Code [id number]  Mayer Camel, NP ED   11/04/2018 1650 11/08/2018 2006 Full Code [id number]  Saundra Shelling, MD Inpatient   04/10/2016 2034 04/19/2016 1757 Full Code [id number]  Vianne Bulls, MD Inpatient   09/29/2015 1053 10/10/2015 1406 Full Code [id number]  [name], MD Inpatient   04/28/2015 1626 04/30/2015 1422 Full Code [id number]  Demetrios Loll, MD Inpatient   Advance Care Planning Activity    Advance  Directive Documentation     Most Recent Value  Type of Advance Directive  Out of facility DNR (pink MOST or yellow form)  Pre-existing out of facility DNR order (yellow form or pink MOST form)  Yellow form placed  in chart (order not valid for inpatient use)  "MOST" Form in Place?  -     Family Communication: Family updated- spoke to her daughter [name] at bedside .  Agreeable with current plan of care.   Disposition Plan: To be determined based on clinical course  Time spent: 81 minutes  [place]

## 2019-02-05 NOTE — Plan of Care (Signed)
  Problem: Clinical Measurements: Goal: Ability to maintain clinical measurements within normal limits will improve Outcome: Progressing Goal: Will remain free from infection Outcome: Progressing Goal: Diagnostic test results will improve Outcome: Progressing   Problem: Safety: Goal: Ability to remain free from injury will improve Outcome: Progressing   

## 2019-02-05 NOTE — TOC Progression Note (Signed)
Transition of Care [facility]) - Progression Note    Patient Details  Name: [name] MRN: [id number] Date of Birth: Mar 31, 1935  Transition of [facility]) CM/SW Contact  Carrie Stack, RN Phone Number: 02/05/2019, 3:30 PM  Clinical Narrative:   Spoke again with [place] and informed that there will be no skilled beds available to members outside of their community. Attending and CM discussed long term acute care hospital with patient's daughter [name].  She would like to investigate and facility preference is [facility]. Notified [name] who reviewed record and stated that patient did meet criteria.  It was discussed that patient "would have to transfer today."  CM informed [facility] that family would need time to consider, maybe even tour facility and decision could not be made today. Danae Chen is to make contact with daughter [name]    Expected Discharge Plan: (home with home health vs SNF)    Expected Discharge Plan and Services Expected Discharge Plan: (home with home health vs SNF)   Discharge Planning Services: CM Consult   Living arrangements for the past 2 months: [facility] settiings)                 DME Arranged: (Heads up to [facility] with [facility] for hospital bed. Have entered medical necissity for the bed) DME Agency: [facility]     Representative spoke with at DME Agency: [place] Arranged: RN, PT, OT, Nurse's Aide [place] Agency: [place] ([place]) Date [place]: 01/19/19       Social Determinants of Health (SDOH) Interventions    Readmission Risk Interventions Readmission Risk Prevention Plan 12/26/2018 12/07/2018 11/30/2018  Transportation Screening Complete Complete Complete  PCP or Specialist Appt within 3-5 Days - Complete -  HRI or Home Care Consult Complete Complete -  Social Work Consult for [place] Planning/Counseling Complete Not Complete -  SW consult not completed comments - NA -  Palliative Care Screening - Not Applicable -  Medication  Review Press photographer) Complete Complete Complete  PCP or Specialist appointment within 3-5 days of discharge - - Complete  HRI or [place] - - Complete  SW Recovery Care/Counseling Consult - - Not Complete  SW Consult Not Complete Comments - - na  Palliative Care Screening - - Not [place] - - Complete  Some recent data might be hidden

## 2019-02-05 NOTE — Progress Notes (Signed)
[place] Hospital Day(s): [place] op day(s):  Marland Kitchen   Interval History: Patient seen and examined, no acute events or new complaints overnight. Patient reports sill with some discomfort in her sacral region. No fevers. No leukocytosis. Currently doing santyl dressing changes and local wound care.    Vital signs in last 24 hours: [min-max] current  Temp:  [97.7 F (36.5 C)-98.1 F (36.7 C)] 97.7 F (36.5 C) (08/07 0723) Pulse Rate:  [105-107] 105 (08/07 0723) Resp:  [20] 20 (08/07 0413) BP: (97-109)/(68-70) 109/69 (08/07 0723) SpO2:  [91 %-100 %] 98 % (08/07 0723)     Height: 4\' 11"  (149.9 cm) Weight: (Can't not get pt's wt.) BMI (Calculated): 23.92   Intake/Output last 2 shifts:  08/06 0701 - 08/07 0700 In: -  Out: 5 [Drains:5]   Physical Exam:  Constitutional: alert, cooperative and no distress  Respiratory: breathing non-labored at rest  Integumentary: 3 x 3 cm pressure injury to the coccyx, there is about 2 cm of undermining from th 11 - 3 o'clock positions, there is minimal fibrinous tissue present, no purulence or necrosis, bone is palpable today  Wound (Sacrum) 02/05/2019:      Labs:  CBC Latest Ref Rng & Units 02/05/2019 02/04/2019 02/02/2019  WBC 4.0 - 10.5 K/uL 7.6 6.7 7.8  Hemoglobin 12.0 - 15.0 g/dL 8.1(L) 8.2(L) 7.8(L)  Hematocrit 36.0 - 46.0 % 27.5(L) 27.3(L) 26.1(L)  Platelets 150 - 400 K/uL 247 230 230   CMP Latest Ref Rng & Units 02/05/2019 02/04/2019 02/03/2019  Glucose 70 - 99 mg/dL 90 97 84  BUN 8 - 23 mg/dL 34(H) 31(H) 28(H)  Creatinine 0.44 - 1.00 mg/dL 1.81(H) 1.83(H) 1.48(H)  Sodium 135 - 145 mmol/L 134(L) 135 138  Potassium 3.5 - 5.1 mmol/L 3.8 4.2 3.3(L)  Chloride 98 - 111 mmol/L 94(L) 96(L) 97(L)  CO2 22 - 32 mmol/L 28 30 32  Calcium 8.9 - 10.3 mg/dL 8.8(L) 8.8(L) 8.6(L)  Total Protein 6.5 - 8.1 g/dL - - -  Total Bilirubin 0.3 - 1.2 mg/dL - - -  Alkaline Phos 38 - 126 U/L - - -  AST 15 - 41 U/L - - -  ALT 0  - 44 U/L - - -     Imaging studies: No new pertinent imaging studies   Assessment/Plan: (ICD-10's: L89.153) 84 y.o. female with pressure injury to the coccyx/sacrum s/p bedside debridement on 08/06 without evidence of necrosis or infection, complicated by pertinent comorbidities including deconditioned state, advanced age, and recent ORIF.   - No further debridement indication today  - Will continue to reassess; may need formal debridement in OR in the future             - Continue local wound care; frequent repositioning; santyl dressing changes daily             - optimize from nutritional standpoint             - mobilization as tolerated given recent ORIF; working with PT             - Further management per medical team  All of the above findings and recommendations were discussed with the patient, patient's family (daughter at bedside), and the medical team, and all of patient's and family's questions were answered to their expressed satisfaction.  -- Edison Simon, PA-C [facility] 02/05/2019, 11:06 AM [phone] M-F: 7am - 4pm

## 2019-02-05 NOTE — Consult Note (Signed)
ORTHOPAEDICS PROGRESS NOTE  PATIENT NAME: [name] DOB: 02/13/1935  MRN: [id number]  POD # 25: ORIF of right intertrochanteric femur fracture (performed at [facility])  Subjective: The patient is awake and complains of frequent bowel movements and pain with her area of sacral breakdown.  Objective: Vital signs in last 24 hours: Temp:  [97.5 F (36.4 C)-98.1 F (36.7 C)] 97.5 F (36.4 C) (08/07 1657) Pulse Rate:  [105-107] 106 (08/07 1657) Resp:  [20] 20 (08/07 0413) BP: (97-109)/(68-70) 105/69 (08/07 1657) SpO2:  [91 %-100 %] 99 % (08/07 1657)  Intake/Output from previous day: 08/06 0701 - 08/07 0700 In: -  Out: 5 [Drains:5]  Recent Labs    02/04/19 0425 02/05/19 0556  WBC 6.7 7.6  HGB 8.2* 8.1*  HCT 27.3* 27.5*  PLT 230 247  K 4.2 3.8  CL 96* 94*  CO2 30 28  BUN 31* 34*  CREATININE 1.83* 1.81*  GLUCOSE 97 90  CALCIUM 8.8* 8.8*  INR 2.2* 2.1*    EXAM General: Frail-appearing female seen in no apparent discomfort. Right lower extremity: The wound VAC was removed.  The surgical incision is well approximated.  No drainage can be expressed.  There is marked improvement in the size of the previous hematoma.  No erythema.  A new dressing was applied. Neurologic: Awake, alert, and oriented.  Sensory and motor function are grossly intact.  Assessment: ORIF of right intertrochanteric femur fracture Right hip hematoma, improved  Plan: The wound VAC was discontinued.  Will monitor for any increased drainage. The patient is severely deconditioned and fatigues easily.  Her status is compromised by the prominent sacral decubitus wound (I reviewed the pictures) as well as her medical comorbidities. Continue attempts at mobilization of the patient as tolerated.  Romelle Reiley P. Holley Bouche M.D.

## 2019-02-06 LAB — BASIC METABOLIC PANEL
Anion gap: 12 (ref 5–15)
BUN: 36 mg/dL — ABNORMAL HIGH (ref 8–23)
CO2: 29 mmol/L (ref 22–32)
Calcium: 8.9 mg/dL (ref 8.9–10.3)
Chloride: 95 mmol/L — ABNORMAL LOW (ref 98–111)
Creatinine, Ser: 2.04 mg/dL — ABNORMAL HIGH (ref 0.44–1.00)
GFR calc Af Amer: 26 mL/min — ABNORMAL LOW (ref >60)
GFR calc non Af Amer: 22 mL/min — ABNORMAL LOW (ref >60)
Glucose, Bld: 95 mg/dL (ref 70–99)
Potassium: 4 mmol/L (ref 3.5–5.1)
Sodium: 136 mmol/L (ref 135–145)

## 2019-02-06 LAB — PROTIME-INR
INR: 2.1 — ABNORMAL HIGH (ref 0.8–1.2)
Prothrombin Time: 23.4 seconds — ABNORMAL HIGH (ref 11.4–15.2)

## 2019-02-06 LAB — CBC
HCT: 28.6 % — ABNORMAL LOW (ref 36.0–46.0)
Hemoglobin: 8.5 g/dL — ABNORMAL LOW (ref 12.0–15.0)
MCH: 29.1 pg (ref 26.0–34.0)
MCHC: 29.7 g/dL — ABNORMAL LOW (ref 30.0–36.0)
MCV: 97.9 fL (ref 80.0–100.0)
Platelets: 264 10*3/uL (ref 150–400)
RBC: 2.92 MIL/uL — ABNORMAL LOW (ref 3.87–5.11)
RDW: 19 % — ABNORMAL HIGH (ref 11.5–15.5)
WBC: 8.6 10*3/uL (ref 4.0–10.5)
nRBC: 0 % (ref 0.0–0.2)

## 2019-02-06 MED ORDER — WARFARIN SODIUM 2.5 MG PO TABS
2.5000 mg | ORAL_TABLET | Freq: Once | ORAL | Status: AC
  Administered 2019-02-06: 2.5 mg via ORAL
  Filled 2019-02-06: qty 1

## 2019-02-06 MED ORDER — FOSFOMYCIN TROMETHAMINE 3 G PO PACK
3.0000 g | PACK | Freq: Once | ORAL | Status: AC
  Administered 2019-02-06: 3 g via ORAL
  Filled 2019-02-06: qty 3

## 2019-02-06 NOTE — Plan of Care (Signed)
  Problem: Pain Managment: Goal: General experience of comfort will improve Outcome: Progressing   Problem: Safety: Goal: Ability to remain free from injury will improve Outcome: Progressing   

## 2019-02-06 NOTE — Progress Notes (Addendum)
Unable to weigh pt this morning. Tried to weigh pt on standing scale but pt states that right leg are weak. Try to use the lift on her but lift will not turn on. Will notify incoming shift. Will continue to monitor.

## 2019-02-06 NOTE — Plan of Care (Signed)
I paid a social visit to [name] today on behalf of the surgical service.  Dr. Hampton Abbot performed a bedside debridement of fibrinous tissue from her sacral wound yesterday.  There is a photo in the chart on yesterday's note.  [name] states that she is still in a fair amount of pain from multiple sites.  She is uncomfortable at her sacrum.  I will plan to reevaluate the wound tomorrow.  Continue dressing changes as ordered.  Upon reassessment, we will determine whether or not additional debridement is warranted.

## 2019-02-06 NOTE — Progress Notes (Signed)
Progress Note  Patient Name: [name] Date of Encounter: 02/06/2019  Primary Cardiologist:   Ida Rogue, MD   Subjective   She is worried about oozing at her hip wound.  No acute complaints with her breathing and no distress.  No chest pain.   Inpatient Medications    Scheduled Meds: . acetaminophen  650 mg Oral Once  . amiodarone  200 mg Oral BID  . collagenase   Topical Daily  . furosemide  40 mg Oral Daily  . mouth rinse  15 mL Mouth Rinse BID  . multivitamin-lutein  1 capsule Oral Daily  . pantoprazole  40 mg Oral Daily  . sodium chloride flush  3 mL Intravenous Q12H  . vitamin C  500 mg Oral Daily  . warfarin  2.5 mg Oral ONCE-1800  . Warfarin - Pharmacist Dosing Inpatient   Does not apply q1800   Continuous Infusions:  PRN Meds: acetaminophen, acetaminophen, calcium carbonate, ipratropium-albuterol, liver oil-zinc oxide, Melatonin, menthol-cetylpyridinium **OR** phenol, metoCLOPramide **OR** metoCLOPramide (REGLAN) injection, nystatin, ondansetron **OR** ondansetron (ZOFRAN) IV, oxyCODONE, traMADol   Vital Signs    Vitals:   02/05/19 2247 02/06/19 0014 02/06/19 0435 02/06/19 0726  BP: 95/68 97/73 110/87 101/68  Pulse:  (!) 102 (!) 105 (!) 106  Resp: 20     Temp:   97.6 F (36.4 C) 97.6 F (36.4 C)  TempSrc:   Oral Oral  SpO2:   (!) 87% 100%  Weight:      Height:        Intake/Output Summary (Last 24 hours) at 02/06/2019 0900 Last data filed at 02/06/2019 0440 Gross per 24 hour  Intake -  Output 300 ml  Net -300 ml   Filed Weights   02/01/19 0518 02/02/19 0620  Weight: 47.9 kg 53.8 kg    Telemetry    Atrial tach with ventricular pacing - Personally Reviewed  ECG    NA - Personally Reviewed  Physical Exam   GEN: No acute distress.  Frail Neck: No  JVD Cardiac: RRR, no murmurs, rubs, or gallops.  Respiratory: Clear  to auscultation bilaterally. GI: Soft, nontender, non-distended  MS: No  edema; No deformity. Neuro:  Nonfocal   Psych: Normal affect   Labs    Chemistry Recent Labs  Lab 02/04/19 0425 02/05/19 0556 02/06/19 0616  NA 135 134* 136  K 4.2 3.8 4.0  CL 96* 94* 95*  CO2 30 28 29   GLUCOSE 97 90 95  BUN 31* 34* 36*  CREATININE 1.83* 1.81* 2.04*  CALCIUM 8.8* 8.8* 8.9  GFRNONAA 25* 26* 22*  GFRAA 29* 30* 26*  ANIONGAP 9 12 12      Hematology Recent Labs  Lab 02/04/19 0425 02/05/19 0556 02/06/19 0616  WBC 6.7 7.6 8.6  RBC 2.80* 2.83* 2.92*  HGB 8.2* 8.1* 8.5*  HCT 27.3* 27.5* 28.6*  MCV 97.5 97.2 97.9  MCH 29.3 28.6 29.1  MCHC 30.0 29.5* 29.7*  RDW 19.5* 19.2* 19.0*  PLT 230 247 264    Cardiac EnzymesNo results for input(s): TROPONINI in the last 168 hours. No results for input(s): TROPIPOC in the last 168 hours.   BNP Recent Labs  Lab 02/02/19 1047 02/05/19 0556  BNP 1,572.0* 1,492.0*     DDimer No results for input(s): DDIMER in the last 168 hours.   Radiology    No results found.  Cardiac Studies   TTE 11/05/2018 1. The left ventricle has severely reduced systolic function, with an ejection fraction of 25-30%. The  cavity size was mildly dilated. Left ventricular diastolic Doppler parameters are consistent with impaired relaxation. Left ventrical global  hypokinesis without regional wall motion abnormalities. 2. The right ventricle has mildly reduced systolic function. The cavity was moderately enlarged. There is no increase in right ventricular wall thickness. Right ventricular systolic pressure is moderately elevated with an estimated pressure of 53 mmHg. 3. Right atrial size was moderately dilated. 4. Left atrial size was moderately dilated. 5. The aortic valve was not well visualized. Moderate calcification of the aortic valve. Aortic valve regurgitation was not assessed by color flow Doppler. Unable to estimate adegree of aortic valve stenosis given severely reduced systolic function. 6. Mechanical mitral valve. Mean gradient 5 mm Hg. 7. Tricuspid valve  regurgitation is moderate. 8. Left pleural effusion noted, 6.5 cm 9. Rhythm is atrial tachycardia, rate 105 bpm  Patient Profile     84 y.o. female with a hx of atrial fibrillation, chronic combined systolic and diastolic congestive heart failure, nonischemic cardiomyopathy status post CRT-P(11/2017), left bundle branch block, hypertension, chronic lower extremity edema, mitral valve disease status post mechanical mitral valve replacement on chronic Coumadin, s/p spontaneous left calf hematoma requiring debridement, stage III chronic kidney disease,11/2018 mechanical fall with subsequent R hip fracture and later R hip surgery ([facility]),and seen for management and optimization ofcardiac medications in the setting of the above complex medical and cardiac conditions and s/pright-sided intertrochanteric femur fracture and repair at [facility]     Paroxysmal atrial fibrillation / Atrial Tachycardia Atrial tach paced.  Rate is 100 and stable.  No change in therapy except as below with diuretic.   Acute on chronic combined systolic and diastolic CHF/NICM Lasix restarted yesterday but Creat is elevated.  I will hold further diuretic for now since she is oxygenating well   Blood loss anemias/p R hip surgery with R hip hematoma / tear Hgb is stable.  However, there is some oozing at her wound.  I will defer to the primary team.  AKI Creat is up slighlty.  See above.    Valvular heart disease s/p mechanical mitral valve Continue warfarin per pharmacy.    --Continue Coumadin, INR checks. Target INR 2-3 (currently 2.1)  in setting of mechanical mitral valve, PAF, and spontaneous lower extremity hematomas.   For questions or updates, please contact [place] Please consult [email or web address] for contact info under Cardiology/STEMI.   Signed, Minus Breeding, MD  02/06/2019, 9:00 AM

## 2019-02-06 NOTE — Progress Notes (Signed)
Right hip surgical site dressing saturated, copious amount of serosanguinous drainage sipping through the dressing, dr Marry Guan team notified, received verbal order to replace wound vac back on

## 2019-02-06 NOTE — Plan of Care (Signed)
  Problem: Education: Goal: Knowledge of General Education information will improve Description: Including pain rating scale, medication(s)/side effects and non-pharmacologic comfort measures Outcome: Progressing   Problem: Clinical Measurements: Goal: Will remain free from infection Outcome: Progressing   

## 2019-02-06 NOTE — Progress Notes (Signed)
Patient ID: [name], female   DOB: 08-23-1934, 84 y.o.   MRN: [id number]   [facility] PROGRESS NOTE  [name] [id number] DOB: Nov 21, 1934 DOA: 01/09/2019 PCP: Rusty Aus, MD  HPI/Subjective:  Patient seen and evaluated very tired as she could not sleep well last night Son at bedside.  Wound VAC is placed again   Objective: Vitals:   02/06/19 0435 02/06/19 0726  BP: 110/87 101/68  Pulse: (!) 105 (!) 106  Resp:    Temp: 97.6 F (36.4 C) 97.6 F (36.4 C)  SpO2: (!) 87% 100%    Intake/Output Summary (Last 24 hours) at 02/06/2019 1544 Last data filed at 02/06/2019 0440 Gross per 24 hour  Intake -  Output 300 ml  Net -300 ml   Filed Weights   02/01/19 0518 02/02/19 0620  Weight: 47.9 kg 53.8 kg    ROS: Review of Systems  Constitutional: Negative for chills and fever.  Eyes: Negative for blurred vision.  Respiratory: Negative for cough and shortness of breath.   Cardiovascular: Negative for chest pain.  Gastrointestinal: Negative for abdominal pain, diarrhea, nausea and vomiting.  Genitourinary: Negative for dysuria.  Musculoskeletal: Positive for joint pain.  Neurological: Negative for dizziness and headaches.   Exam: Physical Exam  HENT:  Nose: No mucosal edema.  Mouth/Throat: No oropharyngeal exudate or posterior oropharyngeal edema.  Eyes: Pupils are equal, round, and reactive to light. Conjunctivae, EOM and lids are normal.  Neck: No JVD present. Carotid bruit is not present. No edema present. No thyroid mass and no thyromegaly present.  Cardiovascular: S1 normal and S2 normal.  Murmur heard.  Systolic murmur is present with a grade of 3/6. tachycardia  Respiratory: No respiratory distress. She has decreased breath sounds in the right lower field and the left lower field. She has no wheezes. She has no rhonchi. She has no rales.  GI: Soft. Bowel sounds are normal. There is no abdominal tenderness.  Musculoskeletal:     Right  ankle: She exhibits swelling.     Left ankle: She exhibits swelling.     Comments: Right hip status post surgery with wound VAC and edema  Lymphadenopathy:    She has no cervical adenopathy.  Neurological: She is alert. No cranial nerve deficit.  Generalized weakness, moves both upper limbs - power 3/5, both lower limbs 2-3/5  Skin: Skin is warm. Nails show no clubbing.  Sacral decubitus ulcer now unstageable with some tunneling  Psychiatric: She has a normal mood and affect.   Foley catheter in place, skin lesions on leg and sacrum.  Data Reviewed: Basic Metabolic Panel: Recent Labs  Lab 02/02/19 0442 02/03/19 0446 02/04/19 0425 02/05/19 0556 02/06/19 0616  NA 137 138 135 134* 136  K 3.4* 3.3* 4.2 3.8 4.0  CL 95* 97* 96* 94* 95*  CO2 32 32 30 28 29   GLUCOSE 100* 84 97 90 95  BUN 27* 28* 31* 34* 36*  CREATININE 1.57* 1.48* 1.83* 1.81* 2.04*  CALCIUM 8.7* 8.6* 8.8* 8.8* 8.9  CBC: Recent Labs  Lab 01/31/19 0324 01/31/19 0849 02/02/19 0442 02/04/19 0425 02/05/19 0556 02/06/19 0616  WBC 7.8  --  7.8 6.7 7.6 8.6  HGB 7.6* 8.0* 7.8* 8.2* 8.1* 8.5*  HCT 25.5*  --  26.1* 27.3* 27.5* 28.6*  MCV 96.6  --  98.5 97.5 97.2 97.9  PLT 190  --  230 230 247 264   BNP (last 3 results) Recent Labs    12/04/18 1954 02/02/19 1047  02/05/19 0556  BNP 992.0* 1,572.0* 1,492.0*     Recent Results (from the past 240 hour(s))  Urine Culture     Status: Abnormal   Collection Time: 01/31/19  2:18 PM   Specimen: Urine, Random  Result Value Ref Range Status   Specimen Description   Final    URINE, RANDOM Performed at [facility], [address], [place], [place] [address]    Special Requests   Final    NONE Performed at [facility], [address] [place], [place] [address]    Culture >=100,000 COLONIES/mL ENTEROCOCCUS FAECALIS (A)  Final   Report Status 02/02/2019 FINAL  Final   Organism ID, Bacteria ENTEROCOCCUS FAECALIS (A)  Final      Susceptibility    Enterococcus faecalis - MIC*    AMPICILLIN <=2 SENSITIVE Sensitive     LEVOFLOXACIN 0.5 SENSITIVE Sensitive     NITROFURANTOIN <=16 SENSITIVE Sensitive     VANCOMYCIN 1 SENSITIVE Sensitive     * >=100,000 COLONIES/mL ENTEROCOCCUS FAECALIS     Studies: No results found.  Scheduled Meds: . acetaminophen  650 mg Oral Once  . amiodarone  200 mg Oral BID  . collagenase   Topical Daily  . fosfomycin  3 g Oral Once  . mouth rinse  15 mL Mouth Rinse BID  . multivitamin-lutein  1 capsule Oral Daily  . pantoprazole  40 mg Oral Daily  . sodium chloride flush  3 mL Intravenous Q12H  . vitamin C  500 mg Oral Daily  . warfarin  2.5 mg Oral ONCE-1800  . Warfarin - Pharmacist Dosing Inpatient   Does not apply q1800   Continuous Infusions:   Assessment/Plan:  1. Acute on chronic combined systolic and diastolic heart failure. Continue oral Lasix patient stable .[place] cardiology is following 2. paroxysmal atrial fibrillation with some atrial tachycardia and history of mitral valve replacement on amiodarone and Coumadin- Pharmacy to manage dose for therapeutic INR.  INR at 2.1.  Patient on amiodarone discontinued beta-blocker in view of dizzy spells in the past    3. Acute blood loss anemia secondary to large right hip hematoma and skin tear right leg.  Also has other hematomas on the legs. Hemoglobin stable. Monitor.  Hemoglobin at 8.2 --8.1 will repeat labs in a.m. 4. Hip fracture status post operative repair at [facility].  Physical therapy recommending rehab.  Family wants patient to go home once clinically more stable and has enough endurance and strength.  Do not want SNF placement.  Continue wound VAC, care per orhtopedics , RN paged orthopedics for assessment 5. Weakness. Status post physical therapy evaluation-suggested rehab placement but family does not want her to go to rehab and would like to take her home once she is able to transfer from bed to commode or wheelchair.  Agreeable to provide  LTAC information    As patient had long hospital stays and has significant weakness, she is still requiring 2+ assist stand up and it does not seem possible in the near future that she might be able to have that much improvement and to move without significant support. I discussed with daughter that whenever she goes home, she will need likely 24X7 care and personal aid. 6. AKI on Chronic kidney disease stage III.  With cardiorenal component and vancomycin, closely monitor with diuresis and discontinued IV vancomycin which might be the etiology for worsening of the renal function.  Patient and daughter are agreeable creatinine 1.48-1.83-1.81 7. sacral decubiti-unstageable with some tunneling.  Consult placed to Dr. Hampton Abbot bedside debridement was done , surgery has reassessed the patient and no further debridement needed.  wound care.  Encouraged to reposition patient frequently-every 1-2 hours 8. Large right hip hematoma.  Needs to be on Coumadin secondary to metallic mitral valve.  Continue to monitor.  Patient has been followed by orthopedics here .  Wound VAC placed.F/U with ortho regarding wound vac continuity as drainage has decreased. 9. Skin tear right lower extremity, left upper extremity supportive care.  Daily wound dressings and wound care team follow-up.Wound care dressings every alternate day. 10.  Altered mental status- metabolic encephalopathy -from acute cystitis, disturbed sleep      Clinically improving .UA was with some WBCs with chronic catheter. Ur cx have E. Fecalis-patient is started on IV vancomycin but changed to oral fosfomycin in view of worsening of the renal function.  Repeat second dose of fosfomycin today patient and son are agreeable      She have PCN allergy, can not give levaquin due to amiodarone and chance of QT prolongation.11. Decreased oral intake- as per dietician started on megestrol.  Daughter has requested to discontinue Megace as patient is eating okay  Research officer, political party provided LTAC information  Code Status: DNR    Code Status Orders  (From admission, onward)         Start     Ordered   01/19/19 0004  Do not attempt resuscitation (DNR)  Continuous    Question Answer Comment  In the event of cardiac or respiratory ARREST Do not call a "code blue"   In the event of cardiac or respiratory ARREST Do not perform Intubation, CPR, defibrillation or ACLS   In the event of cardiac or respiratory ARREST Use medication by any route, position, wound care, and other measures to relive pain and suffering. May use oxygen, suction and manual treatment of airway obstruction as needed for comfort.      01/19/19 0003        Code Status History    Date Active Date Inactive Code Status Order ID Comments User Context   01/11/2019 2311 01/19/2019 0003 Full Code [id number]  Mayer Camel, NP Inpatient   01/03/2019 2309 01/23/2019 2311 Full Code [id number]  Mayer Camel, NP Inpatient   12/30/2018 1220 01/06/2019 0608 DNR [id number]  Ottie Glazier, MD Inpatient   12/26/2018 0147 12/30/2018 1220 Full Code [id number]  Lance Coon, MD Inpatient   12/04/2018 1937 12/15/2018 1643 Full Code [id number]  Mayer Camel, NP Inpatient   11/24/2018 1621 11/30/2018 2027 Full Code [id number]  Fritzi Mandes, MD Inpatient   11/24/2018 1557 11/24/2018 1620 DNR [id number]  Fritzi Mandes, MD Inpatient   11/09/2018 0333 11/24/2018 1557 Full Code [id number]  Mayer Camel, NP ED   11/04/2018 1650 11/08/2018 2006 Full Code [id number]  Saundra Shelling, MD Inpatient   04/10/2016 2034 04/19/2016 1757 Full Code [id number]  Vianne Bulls, MD Inpatient   09/29/2015 1053 10/10/2015 1406 Full Code [id number]  [name], MD Inpatient   04/28/2015 1626 04/30/2015 1422 Full Code [id number]  Demetrios Loll, MD Inpatient   Advance Care Planning Activity    Advance Directive Documentation     Most Recent Value  Type of Advance Directive  Out of facility DNR (pink MOST or yellow form)  Pre-existing out of facility  DNR order (yellow form or pink MOST form)  Yellow form placed in chart (order not valid for inpatient use)  "MOST" Form in Place?  -  Family Communication: Family updated- spoke to her son at bedside .  Agreeable with current plan of care.   Disposition Plan: To be determined based on clinical course  Time spent: 43 minutes  [place]

## 2019-02-06 NOTE — Progress Notes (Addendum)
Patient up in the chair as per family request , wound vac in place ,

## 2019-02-06 NOTE — Consult Note (Signed)
[place] for Warfarin Dosing  Indication: Mitral Valve Replacement, A. Fib    Allergies  Allergen Reactions  . Penicillins Rash    ** daughter reports facial swelling**   Has patient had a PCN reaction causing immediate rash, facial/tongue/throat swelling, SOB or lightheadedness with hypotension: YES Has patient had a PCN reaction causing severe rash involving mucus membranes or skin necrosis: NO Has patient had a PCN reaction that required hospitalization NO Has patient had a PCN reaction occurring within the last 10 years: NO If all of the above answers are "NO", then may proceed with Cephalosporin use.  . Ace Inhibitors     Cough  . Doxycycline Nausea Only  . Hydrocodone Itching and Nausea Only  . Mercury     Other reaction(s): Unknown  . Silver Dermatitis    Severe itching  . Cephalexin Rash  . Clindamycin/Lincomycin Rash  . Nickel Rash    Patient Measurements: Height: 4\' 11"  (149.9 cm) Weight: (couldn't get pt wt, lift would not turn on) IBW/kg (Calculated) : 43.2   Vital Signs: Temp: 97.6 F (36.4 C) (08/08 0726) Temp Source: Oral (08/08 0726) BP: 101/68 (08/08 0726) Pulse Rate: 106 (08/08 0726)  Labs: Recent Labs    02/04/19 0425 02/05/19 0556 02/06/19 0616  HGB 8.2* 8.1* 8.5*  HCT 27.3* 27.5* 28.6*  PLT 230 247 264  LABPROT 24.0* 23.2* 23.4*  INR 2.2* 2.1* 2.1*  CREATININE 1.83* 1.81* 2.04*    Estimated Creatinine Clearance: 16.2 mL/min (A) (by C-G formula based on SCr of 2.04 mg/dL (H)).   Medical History: Past Medical History:  Diagnosis Date  . Arthritis    "back, hands, knees" (04/10/2016)  . Chronic back pain   . 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 EF 25% by echo in 08/2015 b. RHC in 08/2015 showed normal filling pressures; c. 10/2018 Echo (in setting of atrial tachycardia): EF 25-30%, impaired relaxation. Glob HK. RVSP 63mmHg. Mod dil RA/LA. Mech  MV w/ nl fxn (mean grad 64mmHg). Mod TR.   Marland Kitchen Chronic kidney disease   . Compression fracture    "several; all in my back" (04/10/2016)  . GERD (gastroesophageal reflux disease)   . Hypertension   . Lymphedema   . Pacemaker 05/2017  . PAF (paroxysmal atrial fibrillation) ([place])    a. On amio & Coumadin - CHA2DS2VASc = 4.  . S/P MVR (mitral valve replacement)    a. MVR 1994 b. redo MVR in 08/2014 - on Coumadin  . Shortness of breath dyspnea    Assessment: Pharmacy consulted for warfarin dosing and monitoring for 84 yo female with PMH of mitral valve replacement and A. Fib. Patient transferred to [facility] 7/20 PM from [place]. Patient's INR is sensitive to changes. We have been giving her a lower dose from her home dose. Patient may need to be discharged on 2 mg daily.   Home dose: warfarin 2.5 mg daily.   DDI: on amiodarone (increase INR); megace started 8/5 (may increase risk of bleeding/INR)  Spoke to MD on 7/22 regarding low Hgb- okay with continuing warfarin at lower dose.  Today's INR is subtherapeutic, likely d/t the missed dose and the affects of the held/reduced doses. Will need to be conservative with boosting dose since the patient is sensitive to dose changes. Of note, patient had bedside debridement yesterday but may need debridement in the OR. Additionally, patient's son opts for DCCV, but patient has not been therapeutically  anticoagulated during this admission. Per cardio, patient needs to be therapeutically anticoagulated for 1 month prior to considering DCCV.   DATE INR DOSE 7/23 3.0 1 mg - did not receive.  7/24     2.7       2 mg  7/25 2.4 2 mg 7/26 2.2 2.5 mg 7/27     1.9       4 mg 7/28     1.9       3 mg  7/29 2.3 2.5 mg 7/30     2.9       2 mg 7/31     3.1       2 mg  8/1       4.0       Hold  8/2       3.3       1 mg 8/3 2.7 1 mg 8/4 2.5 1.5 mg - missed this dose 8/5 2.2 1.5 mg  8/6 2.2 2 mg  8/7 2.1 2.5 mg 8/8 2.1  Goal of Therapy:  INR 2.5-3.5  (ideally want INR between 2.5 to 3 per previous notes) Monitor platelets by anticoagulation protocol: Yes   Plan:  INR level is subtherapeutic. Will give warfarin 2.5 mg again tonight x 1 dose. Hgb 8.5  Plt 264 Daily INR ordered. Pharmacy will continue to follow and order warfarin based on INRs  Rajat Staver A, PharmD 02/06/2019 8:04 AM

## 2019-02-07 DIAGNOSIS — L98429 Non-pressure chronic ulcer of back with unspecified severity: Secondary | ICD-10-CM

## 2019-02-07 LAB — BASIC METABOLIC PANEL
Anion gap: 13 (ref 5–15)
BUN: 41 mg/dL — ABNORMAL HIGH (ref 8–23)
CO2: 27 mmol/L (ref 22–32)
Calcium: 9 mg/dL (ref 8.9–10.3)
Chloride: 97 mmol/L — ABNORMAL LOW (ref 98–111)
Creatinine, Ser: 2.16 mg/dL — ABNORMAL HIGH (ref 0.44–1.00)
GFR calc Af Amer: 24 mL/min — ABNORMAL LOW (ref >60)
GFR calc non Af Amer: 21 mL/min — ABNORMAL LOW (ref >60)
Glucose, Bld: 107 mg/dL — ABNORMAL HIGH (ref 70–99)
Potassium: 3.7 mmol/L (ref 3.5–5.1)
Sodium: 137 mmol/L (ref 135–145)

## 2019-02-07 LAB — CBC
HCT: 29.9 % — ABNORMAL LOW (ref 36.0–46.0)
Hemoglobin: 8.7 g/dL — ABNORMAL LOW (ref 12.0–15.0)
MCH: 28.7 pg (ref 26.0–34.0)
MCHC: 29.1 g/dL — ABNORMAL LOW (ref 30.0–36.0)
MCV: 98.7 fL (ref 80.0–100.0)
Platelets: 254 10*3/uL (ref 150–400)
RBC: 3.03 MIL/uL — ABNORMAL LOW (ref 3.87–5.11)
RDW: 19 % — ABNORMAL HIGH (ref 11.5–15.5)
WBC: 8 10*3/uL (ref 4.0–10.5)
nRBC: 0 % (ref 0.0–0.2)

## 2019-02-07 LAB — PROTIME-INR
INR: 2.1 — ABNORMAL HIGH (ref 0.8–1.2)
Prothrombin Time: 23.5 seconds — ABNORMAL HIGH (ref 11.4–15.2)

## 2019-02-07 MED ORDER — WARFARIN SODIUM 3 MG PO TABS
3.0000 mg | ORAL_TABLET | Freq: Once | ORAL | Status: AC
  Administered 2019-02-07: 3 mg via ORAL
  Filled 2019-02-07: qty 1

## 2019-02-07 NOTE — Progress Notes (Signed)
Physical Therapy Treatment Patient Details Name: [name] MRN: [id number] DOB: 11/12/1934 Today's Date: 02/07/2019    History of Present Illness  84 y.o. female with a known history of right hip repair at [facility] on 01/11/2019 secondary to mechanical fall at home, paroxysmal atrial fibrillation, mitral valve replacement on Coumadin, COPD, CKD stage III, hypertension, history of left lower extremity hematoma with evacuation and porcine graft performed on 12/25/2018.   PT Comments    Pt declines first attempt this am due to pain.  On second attempt agrees to supine exercises only.  "Don't you all not understand that I don't feel well!"  Encouragement given.  Private sitter asking if she is able to help pt do exercises during the day.  She was educated on basic LE HEP and hand placements to protect skin integrity.  Voiced understanding and wrote exercises down in a notebook.  Ankle pumps, heel slides, ab/add and SLR x 10 reps.  Pt was able to tolerate 2 x 10 during session but fatigue was noted towards the end.  She was noted to be confused today talking about a party at the [facility] last night where she fainted. Will continue as appropriate.   Follow Up Recommendations  SNF;LTACH     Equipment Recommendations       Recommendations for Other Services       Precautions / Restrictions Precautions Precautions: Fall Precaution Comments: fragile skin, sacral ulcer Restrictions Weight Bearing Restrictions: No RLE Weight Bearing: Weight bearing as tolerated    Mobility  Bed Mobility                  Transfers                    Ambulation/Gait                 Stairs             Wheelchair Mobility    Modified Rankin (Stroke Patients Only)       Balance                                            Cognition Arousal/Alertness: Awake/alert Behavior During Therapy: WFL for tasks assessed/performed Overall  Cognitive Status: Impaired/Different from baseline                                 General Comments: confusion noted again today.  [facility].      Exercises Other Exercises Other Exercises: Private sitter in and educated on HEP - ankle pumps, heel slides, ab/add and slr 2 x 10.  Observed and answered questions as appropriate.    General Comments        Pertinent Vitals/Pain Pain Assessment: Faces Faces Pain Scale: Hurts even more Pain Location: general pain in back and hip with movement Pain Descriptors / Indicators: Aching;Sore Pain Intervention(s): Limited activity within patient's tolerance;Monitored during session    Home Living                      Prior Function            PT Goals (current goals can now be found in the care plan section) Progress towards  PT goals: Progressing toward goals    Frequency    Min 2X/week      PT Plan Current plan remains appropriate    Co-evaluation              AM-PAC PT "6 Clicks" Mobility   Outcome Measure  Help needed turning from your back to your side while in a flat bed without using bedrails?: A Lot Help needed moving from lying on your back to sitting on the side of a flat bed without using bedrails?: A Lot Help needed moving to and from a bed to a chair (including a wheelchair)?: A Lot Help needed standing up from a chair using your arms (e.g., wheelchair or bedside chair)?: A Lot Help needed to walk in hospital room?: Total Help needed climbing 3-5 steps with a railing? : Total 6 Click Score: 10    End of Session Equipment Utilized During Treatment: Oxygen Activity Tolerance: Patient tolerated treatment well;Patient limited by pain;Patient limited by fatigue Patient left: in bed;with call bell/phone within reach;with bed alarm set;with family/visitor present   Pain - Right/Left: Right Pain - part of body: Hip      Time: [id number] PT Time Calculation (min) (ACUTE ONLY): 10 min  Charges:  $Therapeutic Exercise: 8-22 mins                    Chesley Noon, PTA 02/07/19, 1:09 PM

## 2019-02-07 NOTE — Progress Notes (Signed)
Patient with persistent hematoma post hip fracture repair. Had a great deal of drainage yesterday, so wound vac reapplied.Marland Kitchen Has had a few hundred cc drainage into wound vac.

## 2019-02-07 NOTE — Progress Notes (Signed)
Progress Note  Patient Name: [name] Date of Encounter: 02/07/2019  Primary Cardiologist:   Ida Rogue, MD   Subjective   No pain.  Always somewhat SOB but no distress  Inpatient Medications    Scheduled Meds: . acetaminophen  650 mg Oral Once  . amiodarone  200 mg Oral BID  . collagenase   Topical Daily  . mouth rinse  15 mL Mouth Rinse BID  . multivitamin-lutein  1 capsule Oral Daily  . pantoprazole  40 mg Oral Daily  . sodium chloride flush  3 mL Intravenous Q12H  . vitamin C  500 mg Oral Daily  . warfarin  3 mg Oral ONCE-1800  . Warfarin - Pharmacist Dosing Inpatient   Does not apply q1800   Continuous Infusions:  PRN Meds: acetaminophen, acetaminophen, calcium carbonate, ipratropium-albuterol, liver oil-zinc oxide, Melatonin, menthol-cetylpyridinium **OR** phenol, metoCLOPramide **OR** metoCLOPramide (REGLAN) injection, nystatin, ondansetron **OR** ondansetron (ZOFRAN) IV, oxyCODONE, traMADol   Vital Signs    Vitals:   02/06/19 2037 02/07/19 0121 02/07/19 0522 02/07/19 0726  BP: 103/71 104/66 (!) 112/98 92/66  Pulse: (!) 109 (!) 108 (!) 108 (!) 106  Resp:   20   Temp: 97.8 F (36.6 C)  98.7 F (37.1 C) 97.6 F (36.4 C)  TempSrc: Oral  Oral Oral  SpO2: 96%  93% 99%  Weight:      Height:        Intake/Output Summary (Last 24 hours) at 02/07/2019 1204 Last data filed at 02/07/2019 0129 Gross per 24 hour  Intake -  Output 350 ml  Net -350 ml   Filed Weights   02/02/19 0620  Weight: 53.8 kg    Telemetry    Atrial tach with paced ventricular rhythm  - Personally Reviewed  ECG    NA - Personally Reviewed  Physical Exam   GEN: No  acute distress.   Neck: No  JVD Cardiac: RRR, no murmurs, rubs, or gallops.  Respiratory:    Decreased breath sounds GI: Soft, nontender, non-distended, normal bowel sounds  MS:  Mild edema; No deformity. Neuro:   Nonfocal  Psych: Oriented and appropriate    Labs    Chemistry Recent Labs  Lab  02/05/19 0556 02/06/19 0616 02/07/19 0634  NA 134* 136 137  K 3.8 4.0 3.7  CL 94* 95* 97*  CO2 28 29 27   GLUCOSE 90 95 107*  BUN 34* 36* 41*  CREATININE 1.81* 2.04* 2.16*  CALCIUM 8.8* 8.9 9.0  GFRNONAA 26* 22* 21*  GFRAA 30* 26* 24*  ANIONGAP 12 12 13      Hematology Recent Labs  Lab 02/05/19 0556 02/06/19 0616 02/07/19 0634  WBC 7.6 8.6 8.0  RBC 2.83* 2.92* 3.03*  HGB 8.1* 8.5* 8.7*  HCT 27.5* 28.6* 29.9*  MCV 97.2 97.9 98.7  MCH 28.6 29.1 28.7  MCHC 29.5* 29.7* 29.1*  RDW 19.2* 19.0* 19.0*  PLT 247 264 254    Cardiac EnzymesNo results for input(s): TROPONINI in the last 168 hours. No results for input(s): TROPIPOC in the last 168 hours.   BNP Recent Labs  Lab 02/02/19 1047 02/05/19 0556  BNP 1,572.0* 1,492.0*     DDimer No results for input(s): DDIMER in the last 168 hours.   Radiology    No results found.  Cardiac Studies   TTE 11/05/2018 1. The left ventricle has severely reduced systolic function, with an ejection fraction of 25-30%. The cavity size was mildly dilated. Left ventricular diastolic Doppler parameters are consistent with  impaired relaxation. Left ventrical global  hypokinesis without regional wall motion abnormalities. 2. The right ventricle has mildly reduced systolic function. The cavity was moderately enlarged. There is no increase in right ventricular wall thickness. Right ventricular systolic pressure is moderately elevated with an estimated pressure of 53 mmHg. 3. Right atrial size was moderately dilated. 4. Left atrial size was moderately dilated. 5. The aortic valve was not well visualized. Moderate calcification of the aortic valve. Aortic valve regurgitation was not assessed by color flow Doppler. Unable to estimate adegree of aortic valve stenosis given severely reduced systolic function. 6. Mechanical mitral valve. Mean gradient 5 mm Hg. 7. Tricuspid valve regurgitation is moderate. 8. Left pleural effusion noted, 6.5  cm 9. Rhythm is atrial tachycardia, rate 105 bpm  Patient Profile     84 y.o. female with a hx of atrial fibrillation, chronic combined systolic and diastolic congestive heart failure, nonischemic cardiomyopathy status post CRT-P(11/2017), left bundle branch block, hypertension, chronic lower extremity edema, mitral valve disease status post mechanical mitral valve replacement on chronic Coumadin, s/p spontaneous left calf hematoma requiring debridement, stage III chronic kidney disease,11/2018 mechanical fall with subsequent R hip fracture and later R hip surgery ([facility]),and seen for management and optimization ofcardiac medications in the setting of the above complex medical and cardiac conditions and s/pright-sided intertrochanteric femur fracture and repair at [facility]     Paroxysmal atrial fibrillation / Atrial Tachycardia BP is borderline so I would not suggest trying to titrate meds for this.  Continue supportive therapy.  Acute on chronic combined systolic and diastolic CHF/NICM Lasix was held yesterday.  I will continue to hold today as the creat is up and urine is concentrated.   Blood loss anemias/p R hip surgery with R hip hematoma / tear Hgb is stable and in fact inching up.    AKI Creat is up slighlty.  Diuretic is being held.  Continue to follow.     Valvular heart disease s/p mechanical mitral valve Continue warfarin per pharmacy.    --Continue Coumadin, INR checks. Target INR 2-3 (currently 2.1)  in setting of mechanical mitral valve, PAF, and spontaneous lower extremity hematomas.   For questions or updates, please contact [facility] Please consult [email or web address] for contact info under Cardiology/STEMI.   Signed, Minus Breeding, MD  02/07/2019, 12:04 PM

## 2019-02-07 NOTE — Progress Notes (Addendum)
Pt woundcare on bilateral legs and coccyx was done today. New mepitel on pt skin tear on arm was applied. Will notify incoming shift. Will continue to monitor.

## 2019-02-07 NOTE — Progress Notes (Addendum)
Unable to weigh pt, lift will not turn on. Waiting for maintenance. Will notify incoming shift. Will continue to monitor.

## 2019-02-07 NOTE — Plan of Care (Signed)
  Problem: Clinical Measurements: Goal: Will remain free from infection Outcome: Progressing   Problem: Pain Managment: Goal: General experience of comfort will improve Outcome: Progressing   Problem: Safety: Goal: Ability to remain free from injury will improve Outcome: Progressing   

## 2019-02-07 NOTE — Progress Notes (Signed)
I saw and examined [name] today.  Her daughter was at bedside with her.  She is complaining of pain in her sacral area and complaining about being told she needs to get up frequently.  Vitals:   02/07/19 0726 02/07/19 1610  BP: 92/66 110/68  Pulse: (!) 106 (!) 107  Resp:    Temp: 97.6 F (36.4 C) 98.2 F (36.8 C)  SpO2: 99% 94%   I/O last 3 completed shifts: In: -  Out: 650 [Urine:550; Drains:100] No intake/output data recorded.  With the assistance of her nurse and daughter, the patient was rolled onto her side.  I inspected the sacral wound.  It is clean without additional exudate.  There is no foul odor or pus identified.  No necrotic tissue.  Good granulation tissue present.  There is still some undermining surrounding the sacral bone.  Impression and plan: This is an 84 year old woman who has had a prolonged hospital course secondary to a hip fracture.  She has a sacral decubitus ulcer.  It is clean and does not appear to need any further debridement.  Continue dressing changes as recommended by the [facility] nurse.  We will continue to monitor.

## 2019-02-07 NOTE — Consult Note (Signed)
[place] [facility] for Warfarin Dosing  Indication: Mitral Valve Replacement, A. Fib    Allergies  Allergen Reactions  . Penicillins Rash    ** daughter reports facial swelling**   Has patient had a PCN reaction causing immediate rash, facial/tongue/throat swelling, SOB or lightheadedness with hypotension: YES Has patient had a PCN reaction causing severe rash involving mucus membranes or skin necrosis: NO Has patient had a PCN reaction that required hospitalization NO Has patient had a PCN reaction occurring within the last 10 years: NO If all of the above answers are "NO", then may proceed with Cephalosporin use.  . Ace Inhibitors     Cough  . Doxycycline Nausea Only  . Hydrocodone Itching and Nausea Only  . Mercury     Other reaction(s): Unknown  . Silver Dermatitis    Severe itching  . Cephalexin Rash  . Clindamycin/Lincomycin Rash  . Nickel Rash    Patient Measurements: Height: 4\' 11"  (149.9 cm) Weight: (couldn't get pt's wt, lift would not turn on ) IBW/kg (Calculated) : 43.2   Vital Signs: Temp: 97.6 F (36.4 C) (08/09 0726) Temp Source: Oral (08/09 0726) BP: 92/66 (08/09 0726) Pulse Rate: 106 (08/09 0726)  Labs: Recent Labs    02/05/19 0556 02/06/19 0616 02/07/19 0634  HGB 8.1* 8.5* 8.7*  HCT 27.5* 28.6* 29.9*  PLT 247 264 254  LABPROT 23.2* 23.4* 23.5*  INR 2.1* 2.1* 2.1*  CREATININE 1.81* 2.04* 2.16*    Estimated Creatinine Clearance: 15.3 mL/min (A) (by C-G formula based on SCr of 2.16 mg/dL (H)).   Medical History: Past Medical History:  Diagnosis Date  . Arthritis    "back, hands, knees" (04/10/2016)  . Chronic back pain   . 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 EF 25% by echo in 08/2015 b. RHC in 08/2015 showed normal filling pressures; c. 10/2018 Echo (in setting of atrial tachycardia): EF 25-30%, impaired relaxation. Glob HK. RVSP 22mmHg. Mod dil RA/LA. Mech  MV w/ nl fxn (mean grad 12mmHg). Mod TR.   Marland Kitchen Chronic kidney disease   . Compression fracture    "several; all in my back" (04/10/2016)  . GERD (gastroesophageal reflux disease)   . Hypertension   . Lymphedema   . Pacemaker 05/2017  . PAF (paroxysmal atrial fibrillation) ([place])    a. On amio & Coumadin - CHA2DS2VASc = 4.  . S/P MVR (mitral valve replacement)    a. MVR 1994 b. redo MVR in 08/2014 - on Coumadin  . Shortness of breath dyspnea    Assessment: Pharmacy consulted for warfarin dosing and monitoring for 84 yo female with PMH of mitral valve replacement and A. Fib. Patient transferred to [facility] 7/20 PM from [place]. Patient's INR is sensitive to changes. We have been giving her a lower dose from her home dose. Patient may need to be discharged on 2 mg daily.   Home dose: warfarin 2.5 mg daily.   DDI: on amiodarone (increase INR); megace started 8/5 (may increase risk of bleeding/INR)  Spoke to MD on 7/22 regarding low Hgb- okay with continuing warfarin at lower dose.  Today's INR is subtherapeutic, likely d/t the missed dose and the affects of the held/reduced doses. Will need to be conservative with boosting dose since the patient is sensitive to dose changes. Of note, patient had bedside debridement yesterday but may need debridement in the OR. Additionally, patient's son opts for DCCV, but patient has not been  therapeutically anticoagulated during this admission. Per cardio, patient needs to be therapeutically anticoagulated for 1 month prior to considering DCCV.   DATE INR DOSE 7/23 3.0 1 mg - did not receive.  7/24     2.7       2 mg  7/25 2.4 2 mg 7/26 2.2 2.5 mg 7/27     1.9       4 mg 7/28     1.9       3 mg  7/29 2.3 2.5 mg 7/30     2.9       2 mg 7/31     3.1       2 mg  8/1       4.0       Hold  8/2       3.3       1 mg 8/3 2.7 1 mg 8/4 2.5 1.5 mg - missed this dose 8/5 2.2 1.5 mg  8/6 2.2 2 mg  8/7 2.1 2.5 mg 8/8 2.1 2.5 mg 8/9 2.1  Goal of Therapy:   INR 2.5-3.5 (ideally want INR between 2.5 to 3 per previous notes) Monitor platelets by anticoagulation protocol: Yes   Plan:  INR level is subtherapeutic. Will give warfarin 3 mg tonight x 1 dose. Hgb 8.7  Plt 254 Daily INR ordered. Pharmacy will continue to follow and order warfarin based on INRs  [name] A, PharmD 02/07/2019 9:07 AM

## 2019-02-07 NOTE — Progress Notes (Signed)
Patient's foley leaking from bag. Hospitalist Dr. Posey Pronto notified and instructed to change the drainage bag.

## 2019-02-07 NOTE — Progress Notes (Signed)
Patient ID: JAQUELYNN BLAKENSHIP, female   DOB: Dec 24, 1934, 84 y.o.   MRN: [id number]   [facility] PROGRESS NOTE  DEALIE BALUYOT [id number] DOB: May 26, 1935 DOA: 01/13/2019 PCP: Rusty Aus, MD  HPI/Subjective:  Patient seen and evaluated very tired sleepy but arousable and answering questions appropriately.  Slept well last night  Caregiver Denise at bedside.  Wound VAC is placed again   Objective: Vitals:   02/07/19 0522 02/07/19 0726  BP: (!) 112/98 92/66  Pulse: (!) 108 (!) 106  Resp: 20   Temp: 98.7 F (37.1 C) 97.6 F (36.4 C)  SpO2: 93% 99%    Intake/Output Summary (Last 24 hours) at 02/07/2019 1553 Last data filed at 02/07/2019 0129 Gross per 24 hour  Intake -  Output 350 ml  Net -350 ml   Filed Weights   02/02/19 0620  Weight: 53.8 kg    ROS: Review of Systems  Constitutional: Negative for chills and fever.  Eyes: Negative for blurred vision.  Respiratory: Negative for cough and shortness of breath.   Cardiovascular: Negative for chest pain.  Gastrointestinal: Negative for abdominal pain, diarrhea, nausea and vomiting.  Genitourinary: Negative for dysuria.  Musculoskeletal: Positive for joint pain.  Neurological: Negative for dizziness and headaches.   Exam: Physical Exam  HENT:  Nose: No mucosal edema.  Mouth/Throat: No oropharyngeal exudate or posterior oropharyngeal edema.  Eyes: Pupils are equal, round, and reactive to light. Conjunctivae, EOM and lids are normal.  Neck: No JVD present. Carotid bruit is not present. No edema present. No thyroid mass and no thyromegaly present.  Cardiovascular: S1 normal and S2 normal.  Murmur heard.  Systolic murmur is present with a grade of 3/6. tachycardia  Respiratory: No respiratory distress. She has decreased breath sounds in the right lower field and the left lower field. She has no wheezes. She has no rhonchi. She has no rales.  GI: Soft. Bowel sounds are normal. There is no abdominal  tenderness.  Musculoskeletal:     Right ankle: She exhibits swelling.     Left ankle: She exhibits swelling.     Comments: Right hip status post surgery with wound VAC and edema  Lymphadenopathy:    She has no cervical adenopathy.  Neurological: She is alert. No cranial nerve deficit.  Generalized weakness, moves both upper limbs - power 3/5, both lower limbs 2-3/5  Skin: Skin is warm. Nails show no clubbing.  Sacral decubitus ulcer now unstageable with some tunneling  Psychiatric: She has a normal mood and affect.   Foley catheter in place, skin lesions on leg and sacrum.  Data Reviewed: Basic Metabolic Panel: Recent Labs  Lab 02/03/19 0446 02/04/19 0425 02/05/19 0556 02/06/19 0616 02/07/19 0634  NA 138 135 134* 136 137  K 3.3* 4.2 3.8 4.0 3.7  CL 97* 96* 94* 95* 97*  CO2 32 30 28 29 27   GLUCOSE 84 97 90 95 107*  BUN 28* 31* 34* 36* 41*  CREATININE 1.48* 1.83* 1.81* 2.04* 2.16*  CALCIUM 8.6* 8.8* 8.8* 8.9 9.0  CBC: Recent Labs  Lab 02/02/19 0442 02/04/19 0425 02/05/19 0556 02/06/19 0616 02/07/19 0634  WBC 7.8 6.7 7.6 8.6 8.0  HGB 7.8* 8.2* 8.1* 8.5* 8.7*  HCT 26.1* 27.3* 27.5* 28.6* 29.9*  MCV 98.5 97.5 97.2 97.9 98.7  PLT 230 230 247 264 254   BNP (last 3 results) Recent Labs    12/04/18 1954 02/02/19 1047 02/05/19 0556  BNP 992.0* 1,572.0* 1,492.0*     Recent Results (  from the past 240 hour(s))  Urine Culture     Status: Abnormal   Collection Time: 01/31/19  2:18 PM   Specimen: Urine, Random  Result Value Ref Range Status   Specimen Description   Final    URINE, RANDOM Performed at [facility], [address], [place], [place] [address]    Special Requests   Final    NONE Performed at [facility], [address] [place], [place] [address]    Culture >=100,000 COLONIES/mL ENTEROCOCCUS FAECALIS (A)  Final   Report Status 02/02/2019 FINAL  Final   Organism ID, Bacteria ENTEROCOCCUS FAECALIS (A)  Final      Susceptibility    Enterococcus faecalis - MIC*    AMPICILLIN <=2 SENSITIVE Sensitive     LEVOFLOXACIN 0.5 SENSITIVE Sensitive     NITROFURANTOIN <=16 SENSITIVE Sensitive     VANCOMYCIN 1 SENSITIVE Sensitive     * >=100,000 COLONIES/mL ENTEROCOCCUS FAECALIS     Studies: No results found.  Scheduled Meds: . acetaminophen  650 mg Oral Once  . amiodarone  200 mg Oral BID  . collagenase   Topical Daily  . mouth rinse  15 mL Mouth Rinse BID  . multivitamin-lutein  1 capsule Oral Daily  . pantoprazole  40 mg Oral Daily  . sodium chloride flush  3 mL Intravenous Q12H  . vitamin C  500 mg Oral Daily  . warfarin  3 mg Oral ONCE-1800  . Warfarin - Pharmacist Dosing Inpatient   Does not apply q1800   Continuous Infusions:   Assessment/Plan:  1. Acute on chronic combined systolic and diastolic heart failure.  Lasix on hold in view of renal insufficiency.  Not fluid overloaded at this time [facility] cardiology is following  2. paroxysmal atrial fibrillation with some atrial tachycardia and history of mitral valve replacement on amiodarone and Coumadin- Pharmacy to manage dose for therapeutic INR.  INR at 2.1.  Patient on amiodarone discontinued beta-blocker in view of dizzy spells in the past    3. Acute blood loss anemia secondary to large right hip hematoma and skin tear right leg.  Also has other hematomas on the legs. Hemoglobin stable. Monitor.  Hemoglobin at 8.2 --8.1--8.5-8.7 will repeat labs in a.m. 4. Hip fracture status post operative repair at [facility].  With hematoma physical therapy recommending rehab.  Family wants patient to go home once clinically more stable and has enough endurance and strength.  Do not want SNF placement.  Continue wound VAC, care per orhtopedics dr.Menz  5. Weakness. Status post physical therapy evaluation-suggested rehab placement but family does not want her to go to rehab and would like to take her home once she is able to transfer from bed to commode or wheelchair.  Agreeable to  get  LTAC information    As patient had long hospital stays and has significant weakness, she is still requiring 2+ assist stand up and it does not seem possible in the near future that she might be able to have that much improvement and to move without significant support. I discussed with daughter that whenever she goes home, she will need likely 24X7 care and personal aid. 6. AKI on Chronic kidney disease stage III.  With cardiorenal component and vancomycin, closely monitor with diuresis and discontinued IV vancomycin which might be the etiology for worsening of the renal function.  Patient and son are agreeable creatinine 1.48-1.83-1.81-2.16, nephrology consult placed.  Lasix on hold 7. sacral decubiti-unstageable with some tunneling.  Newly developing  right heel pressure ulcer pressure pads applied. consult placed to Dr. Hampton Abbot bedside debridement was done on the sacral decubitus ulcer, surgery has reassessed the patient and no further debridement needed.  wound care.  Encouraged to reposition patient frequently-every 1-2 hours 8. Large right hip hematoma.  Needs to be on Coumadin secondary to metallic mitral valve.  Continue to monitor.  Patient has been followed by orthopedics here .  Wound VAC placed.F/U with ortho regarding wound vac continuity as drainage has decreased. 9. Skin tear right lower extremity, left upper extremity supportive care.  Daily wound dressings and wound care team follow-up.Wound care dressings every alternate day. 10.  Altered mental status- metabolic encephalopathy -from acute cystitis, disturbed sleep      Clinically improving .UA was with some WBCs with chronic catheter. Ur cx have E. Fecalis-patient is started on IV vancomycin but changed to oral fosfomycin in view of worsening of the renal function.  Repeat second dose of fosfomycin given on 02/06/2019       She have PCN allergy, can not give levaquin due to amiodarone and chance of QT prolongation.11. Decreased oral  intake- as per dietician started on megestrol.  Daughter has requested to discontinue Megace as patient is eating okay  Education officer, museum provided LTAC information.  Follow-up with social worker  Code Status: DNR    Code Status Orders  (From admission, onward)         Start     Ordered   01/19/19 0004  Do not attempt resuscitation (DNR)  Continuous    Question Answer Comment  In the event of cardiac or respiratory ARREST Do not call a "code blue"   In the event of cardiac or respiratory ARREST Do not perform Intubation, CPR, defibrillation or ACLS   In the event of cardiac or respiratory ARREST Use medication by any route, position, wound care, and other measures to relive pain and suffering. May use oxygen, suction and manual treatment of airway obstruction as needed for comfort.      01/19/19 0003        Code Status History    Date Active Date Inactive Code Status Order ID Comments User Context   01/21/2019 2311 01/19/2019 0003 Full Code [id number]  Mayer Camel, NP Inpatient   01/25/2019 2309 01/19/2019 2311 Full Code [id number]  Mayer Camel, NP Inpatient   12/30/2018 1220 01/06/2019 0608 DNR [id number]  Ottie Glazier, MD Inpatient   12/26/2018 0147 12/30/2018 1220 Full Code [id number]  Lance Coon, MD Inpatient   12/04/2018 1937 12/15/2018 1643 Full Code [id number]  Mayer Camel, NP Inpatient   11/24/2018 1621 11/30/2018 2027 Full Code [id number]  Fritzi Mandes, MD Inpatient   11/24/2018 1557 11/24/2018 1620 DNR [id number]  Fritzi Mandes, MD Inpatient   11/09/2018 0333 11/24/2018 1557 Full Code [id number]  Mayer Camel, NP ED   11/04/2018 1650 11/08/2018 2006 Full Code [id number]  Saundra Shelling, MD Inpatient   04/10/2016 2034 04/19/2016 1757 Full Code [id number]  Vianne Bulls, MD Inpatient   09/29/2015 1053 10/10/2015 1406 Full Code [id number]  [name], MD Inpatient   04/28/2015 1626 04/30/2015 1422 Full Code [id number]  Demetrios Loll, MD Inpatient   Advance Care Planning Activity     Advance Directive Documentation     Most Recent Value  Type of Advance Directive  Out of facility DNR (pink MOST or yellow form)  Pre-existing out of facility DNR order (yellow form or pink MOST form)  Yellow form placed in chart (order  not valid for inpatient use)  "MOST" Form in Place?  -     Family Communication: Family updated- spoke to her son at bedside .  Agreeable with current plan of care.   Disposition Plan: To be determined based on clinical course  Time spent: 55 minutes  [place]

## 2019-02-08 ENCOUNTER — Inpatient Hospital Stay: Payer: Medicare Other

## 2019-02-08 LAB — URINALYSIS, MICROSCOPIC (REFLEX)
RBC / HPF: >50 RBC/hpf (ref 0–5)
WBC, UA: >50 WBC/hpf (ref 0–5)

## 2019-02-08 LAB — CBC WITH DIFFERENTIAL/PLATELET
Abs Immature Granulocytes: 0.1 10*3/uL — ABNORMAL HIGH (ref 0.00–0.07)
Basophils Absolute: 0.1 10*3/uL (ref 0.0–0.1)
Basophils Relative: 1 %
Eosinophils Absolute: 0 10*3/uL (ref 0.0–0.5)
Eosinophils Relative: 0 %
HCT: 29.9 % — ABNORMAL LOW (ref 36.0–46.0)
Hemoglobin: 8.7 g/dL — ABNORMAL LOW (ref 12.0–15.0)
Immature Granulocytes: 1 %
Lymphocytes Relative: 4 %
Lymphs Abs: 0.4 10*3/uL — ABNORMAL LOW (ref 0.7–4.0)
MCH: 28.1 pg (ref 26.0–34.0)
MCHC: 29.1 g/dL — ABNORMAL LOW (ref 30.0–36.0)
MCV: 96.5 fL (ref 80.0–100.0)
Monocytes Absolute: 0.7 10*3/uL (ref 0.1–1.0)
Monocytes Relative: 8 %
Neutro Abs: 7.7 10*3/uL (ref 1.7–7.7)
Neutrophils Relative %: 86 %
Platelets: 299 10*3/uL (ref 150–400)
RBC: 3.1 MIL/uL — ABNORMAL LOW (ref 3.87–5.11)
RDW: 18.8 % — ABNORMAL HIGH (ref 11.5–15.5)
WBC: 9 10*3/uL (ref 4.0–10.5)
nRBC: 0 % (ref 0.0–0.2)

## 2019-02-08 LAB — BASIC METABOLIC PANEL
Anion gap: 13 (ref 5–15)
BUN: 47 mg/dL — ABNORMAL HIGH (ref 8–23)
CO2: 27 mmol/L (ref 22–32)
Calcium: 9 mg/dL (ref 8.9–10.3)
Chloride: 97 mmol/L — ABNORMAL LOW (ref 98–111)
Creatinine, Ser: 2.28 mg/dL — ABNORMAL HIGH (ref 0.44–1.00)
GFR calc Af Amer: 23 mL/min — ABNORMAL LOW (ref >60)
GFR calc non Af Amer: 19 mL/min — ABNORMAL LOW (ref >60)
Glucose, Bld: 97 mg/dL (ref 70–99)
Potassium: 3.8 mmol/L (ref 3.5–5.1)
Sodium: 137 mmol/L (ref 135–145)

## 2019-02-08 LAB — PROTIME-INR
INR: 2.1 — ABNORMAL HIGH (ref 0.8–1.2)
Prothrombin Time: 22.9 seconds — ABNORMAL HIGH (ref 11.4–15.2)

## 2019-02-08 LAB — URINALYSIS, COMPLETE (UACMP) WITH MICROSCOPIC
Bilirubin Urine: NEGATIVE
Glucose, UA: NEGATIVE mg/dL
Ketones, ur: NEGATIVE mg/dL
Nitrite: NEGATIVE
Protein, ur: 100 mg/dL — AB
Specific Gravity, Urine: 1.017 (ref 1.005–1.030)
pH: 5 (ref 5.0–8.0)

## 2019-02-08 MED ORDER — LEVOFLOXACIN IN D5W 500 MG/100ML IV SOLN: 500.0000 mg | INTRAVENOUS | Status: DC

## 2019-02-08 MED ORDER — LEVOFLOXACIN IN D5W 750 MG/150ML IV SOLN
750.0000 mg | Freq: Once | INTRAVENOUS | Status: AC
  Administered 2019-02-08: 750 mg via INTRAVENOUS
  Filled 2019-02-08: qty 150

## 2019-02-08 MED ORDER — DEXTROSE 5 % IV SOLN
0.5000 g | Freq: Three times a day (TID) | INTRAVENOUS | Status: DC
  Filled 2019-02-08 (×3): qty 0.5

## 2019-02-08 MED ORDER — WARFARIN SODIUM 2.5 MG PO TABS
2.5000 mg | ORAL_TABLET | Freq: Once | ORAL | Status: AC
  Administered 2019-02-08: 2.5 mg via ORAL
  Filled 2019-02-08: qty 1

## 2019-02-08 MED ORDER — WARFARIN SODIUM 3 MG PO TABS
3.5000 mg | ORAL_TABLET | Freq: Once | ORAL | Status: DC
  Filled 2019-02-08: qty 1

## 2019-02-08 NOTE — Progress Notes (Signed)
Nutrition Follow-up  DOCUMENTATION CODES:   Not applicable  INTERVENTION:   Continue Magic cup TID with meals, each supplement provides 290 kcal and 9 grams of protein.  Continue Ocuvite daily for wound healing (provides zinc, vitamin A, vitamin C, Vitamin E, copper, and selenium).  Continue vitamin C 500 mg po daily.  NUTRITION DIAGNOSIS:   Increased nutrient needs related to wound healing as evidenced by estimated needs.  Ongoing.  GOAL:   Patient will meet greater than or equal to 90% of their needs Progressing.  MONITOR:   PO intake, Supplement acceptance, Labs, Weight trends, I & O's, Skin  ASSESSMENT:   84 y.o. female with a known history of right hip repair at [facility] on 01/11/2019 secondary to mechanical fall at home, paroxysmal atrial fibrillation, mitral valve replacement on Coumadin, COPD, CKD stage IV requiring CRRT last admit, hypertension, history of left lower extremity hematoma with evacuation and porcine graft performed on 12/25/2018.   Spoke to RN, pt has continued to have poor appetite and oral intake since admit. Pt eating <50% of meals. Pt refusing all supplements. Magic Cups being added to meal trays. Family refusing appetite stimulant. Pt may transfer to [facility] today. Pt may may require NGT placement and tube feeds. Per chart, pt is weight stable since admit.   Medications reviewed and include: ocuvite, protonix, vitamin C, warfarin  Labs reviewed: BUN 47(H), creat 2.28(H) Hgb 8.7(L), Hct 29.9(L)  Diet Order:   Diet Order            Diet regular Room service appropriate? Yes; Fluid consistency: Thin  Diet effective now             EDUCATION NEEDS:   Education needs have been addressed  Skin:  Skin Assessment: Reviewed RN Assessment(incision R hip, multiple hematomas and wounds on legs and feet)  Last BM:  8/9- type 6  Height:   Ht Readings from Last 1 Encounters:  01/26/2019 4\' 11"  (1.499 m)   Weight:   Wt Readings  from Last 1 Encounters:  01/04/19 65.3 kg   Ideal Body Weight:  44.5 kg  BMI:  Body mass index is 23.93 kg/m.  Estimated Nutritional Needs:   Kcal:  1400-1600kcal/day  Protein:  70-80g/day  Fluid:  1.1L/day  Koleen Distance MS, RD, LDN Pager #- [phone] Office#- [phone] After Hours Pager: [phone]

## 2019-02-08 NOTE — Progress Notes (Signed)
[facility] - [place] [facility]   PATIENT NAME: [name]    MR#:  [id number]  DATE OF BIRTH:  11-09-1934  SUBJECTIVE:  CHIEF COMPLAINT:  No chief complaint on file.  No new complaint this morning.  Daughter reported patient slightly confused.  Patient however appears much better compared to the last time I saw her several weeks ago. No fevers. REVIEW OF SYSTEMS:  ROS Review of Systems  Constitutional: Negative for chills and fever.  Eyes: Negative for blurred vision.  Respiratory: Negative for cough and shortness of breath.   Cardiovascular: Negative for chest pain.  Gastrointestinal: Negative for abdominal pain, diarrhea, nausea and vomiting.  Genitourinary: Negative for dysuria.  Musculoskeletal: Positive for joint pain.  Neurological: Negative for dizziness and headaches.  DRUG ALLERGIES:   Allergies  Allergen Reactions  . Penicillins Rash    ** daughter reports facial swelling**   Has patient had a PCN reaction causing immediate rash, facial/tongue/throat swelling, SOB or lightheadedness with hypotension: YES Has patient had a PCN reaction causing severe rash involving mucus membranes or skin necrosis: NO Has patient had a PCN reaction that required hospitalization NO Has patient had a PCN reaction occurring within the last 10 years: NO If all of the above answers are "NO", then may proceed with Cephalosporin use.  . Ace Inhibitors     Cough  . Doxycycline Nausea Only  . Hydrocodone Itching and Nausea Only  . Mercury     Other reaction(s): Unknown  . Silver Dermatitis    Severe itching  . Cephalexin Rash  . Clindamycin/Lincomycin Rash  . Nickel Rash   VITALS:  Blood pressure 91/63, pulse (!) 108, temperature 98.3 F (36.8 C), resp. rate 20, height 4\' 11"  (1.499 m), weight 53.8 kg, SpO2 95 %. PHYSICAL EXAMINATION:  Physical Exam   HENT:  Nose: No mucosal edema.  Mouth/Throat: No oropharyngeal exudate or posterior oropharyngeal  edema.  Eyes: Pupils are equal, round, and reactive to light. Conjunctivae, EOM and lids are normal.  Neck: No JVD present. Carotid bruit is not present. No edema present. No thyroid mass and no thyromegaly present.  Cardiovascular: S1 normal and S2 normal.  Murmur heard.  Systolic murmur is present with a grade of 3/6. tachycardia  Respiratory: No respiratory distress.no wheezing.  No rales  GI: Soft. Bowel sounds are normal. There is no abdominal tenderness.  Musculoskeletal:     Right ankle: She exhibits swelling.     Left ankle: She exhibits swelling.     Comments: Right hip status post surgery with wound VAC and edema  Lymphadenopathy:    She has no cervical adenopathy.  Neurological: She is alert.  Generalized weakness, moves both upper limbs - power 3/5, both lower limbs 2-3/5  Skin: Skin is warm. Nails show no clubbing.  Sacral decubitus ulcer now unstageable Psychiatric: She has a normal mood and affect.  Foley catheter in place, skin lesions on leg and sacrum.  LABORATORY PANEL:  Female CBC Recent Labs  Lab 02/08/19 0405  WBC 9.0  HGB 8.7*  HCT 29.9*  PLT 299   ------------------------------------------------------------------------------------------------------------------ Chemistries  Recent Labs  Lab 02/08/19 0405  NA 137  K 3.8  CL 97*  CO2 27  GLUCOSE 97  BUN 47*  CREATININE 2.28*  CALCIUM 9.0   RADIOLOGY:  No results found. ASSESSMENT AND PLAN:   Acute on chronic combined systolic and diastolic heart failure.  Lasix on hold in view of renal insufficiency.  Not fluid overloaded  at this time [facility] cardiology is following  paroxysmal atrial fibrillation with some atrial tachycardia and history of mitral valve replacement on amiodarone and Coumadin- Pharmacy to manage dose for therapeutic INR.  INR at 2.1.  Patient on amiodarone discontinued beta-blocker in view of dizzy spells in the past    Acute blood loss anemia secondary to large right hip  hematoma and skin tear right leg.  Also has other hematomas on the legs. Hemoglobin stable. Monitor.  Hemoglobin at 8.2 --8.1--8.5-8.7 will repeat labs in a.m. Hip fracture status post operative repair at [facility].  With hematoma physical therapy recommending rehab.  Family wants patient to go home once clinically more stable and has enough endurance and strength.  Do not want SNF placement.  Continue wound VAC, care per orhtopedics dr.Menz  Weakness. Status post physical therapy evaluation-suggested rehab placement but family does not want her to go to rehab and would like to take her home once she is able to transfer from bed to commode or wheelchair.  Agreeable to get  LTAC information    As patient had long hospital stays and has significant weakness, she is still requiring 2+ assist stand up and it does not seem possible in the near future that she might be able to have that much improvement and to move without significant support. I discussed with daughter that whenever she goes home, she will need likely 24X7 care and personal aid. AKI on Chronic kidney disease stage III.  With cardiorenal component and vancomycin, closely monitor with diuresis and discontinued IV vancomycin which might be the etiology for worsening of the renal function.  Patient and son are agreeable creatinine 1.48-1.83-1.81-2.16-2.28, nephrology consult placed.  Lasix on hold sacral decubiti-unstageable with some tunneling.  Newly developing right heel pressure ulcer pressure pads applied.   Surgery following patient.  wound care.  Encouraged to reposition patient frequently-every 1-2 hours Large right hip hematoma.  Needs to be on Coumadin secondary to metallic mitral valve.  Continue to monitor.  Patient has been followed by orthopedics here .  Wound VAC placed.F/U with ortho regarding wound vac continuity as drainage has decreased. Skin tear right lower extremity, left upper extremity supportive care.  Daily wound dressings and  wound care team follow-up.Wound care dressings every alternate day. 10.  Altered mental status- metabolic encephalopathy -from acute cystitis, disturbed sleep      Clinically improving .UA was with some WBCs with chronic catheter. Ur cx have E. Fecalis-patient is started on IV vancomycin but previously discontinued due to renal failure and patient treated with fosfomycin  Daughter and nursing staff reports patient more confused today compared to yesterday.  Requested for repeat urinalysis.  Social worker provided [employer].    Patient's daughter wishes to look into option of LTAC on discharge  All the records are reviewed and case discussed with Care Management/Social Worker. Management plans discussed with the patient, family and they are in agreement. Updated daughter present at bedside on treatment plans.  She agrees with plan of care  CODE STATUS: DNR  TOTAL TIME TAKING CARE OF THIS PATIENT: 38 minutes.   More than 50% of the time was spent in counseling/coordination of care: YES  POSSIBLE D/C IN 2 DAYS, DEPENDING ON CLINICAL CONDITION.   Larisha Vencill M.D on 02/08/2019 at 1:47 PM  Between 7am to 6pm - Pager - [phone]  After 6pm go to [email or web address] - Scientist, research (life sciences) [facility]  Office  [phone]  CC: Primary care physician;  Danella Penton, MD  Note: This dictation was prepared with Dragon dictation along with smaller phrase technology. Any transcriptional errors that result from this process are unintentional.

## 2019-02-08 NOTE — Progress Notes (Signed)
[facility]  ROUNDING NOTE   Subjective:  We are asked to see the patient in follow-up.  Previously when we saw her she was on dialysis and was then transferred to [facility]. At [facility] she did end up having her hip fracture repaired. She was weaned from dialysis. Postoperatively she came back here for ongoing management. Over the past several days her renal function has worsened. Creatinine up to 2.2.   Objective:  Vital signs in last 24 hours:  Temp:  [97.7 F (36.5 C)-98.3 F (36.8 C)] 98.3 F (36.8 C) (08/10 0742) Pulse Rate:  [106-109] 108 (08/10 0742) Resp:  [16-20] 20 (08/10 0742) BP: (91-110)/(63-75) 91/63 (08/10 0742) SpO2:  [94 %-95 %] 95 % (08/10 0742)  Weight change:  Filed Weights    Intake/Output: I/O last 3 completed shifts: In: -  Out: 350 [Urine:250; Drains:100]   Intake/Output this shift:  No intake/output data recorded.  Physical Exam: General: Chronically ill-appearing  Head: Normocephalic, atraumatic. Moist oral mucosal membranes  Eyes: Anicteric  Neck: Supple, trachea midline  Lungs:  Bilateral crackles at bases, normal effort  Heart: Regular rate and rhythm, paced  Abdomen:  Soft, nontender  Extremities: ++ peripheral edema, legs wrapped  Neurologic: Nonfocal, moving all four extremities  Skin: No lesions  Access: none    Basic Metabolic Panel: Recent Labs  Lab 02/04/19 0425 02/05/19 0556 02/06/19 0616 02/07/19 0634 02/08/19 0405  NA 135 134* 136 137 137  K 4.2 3.8 4.0 3.7 3.8  CL 96* 94* 95* 97* 97*  CO2 30 28 29 27 27   GLUCOSE 97 90 95 107* 97  BUN 31* 34* 36* 41* 47*  CREATININE 1.83* 1.81* 2.04* 2.16* 2.28*  CALCIUM 8.8* 8.8* 8.9 9.0 9.0    Liver Function Tests: No results for input(s): AST, ALT, ALKPHOS, BILITOT, PROT, ALBUMIN in the last 168 hours. No results for input(s): LIPASE, AMYLASE in the last 168 hours. No results for input(s): AMMONIA in the last 168 hours.  CBC: Recent Labs   Lab 02/04/19 0425 02/05/19 0556 02/06/19 0616 02/07/19 0634 02/08/19 0405  WBC 6.7 7.6 8.6 8.0 9.0  NEUTROABS  --   --   --   --  7.7  HGB 8.2* 8.1* 8.5* 8.7* 8.7*  HCT 27.3* 27.5* 28.6* 29.9* 29.9*  MCV 97.5 97.2 97.9 98.7 96.5  PLT 230 247 264 254 299    Cardiac Enzymes: No results for input(s): CKTOTAL, CKMB, CKMBINDEX, TROPONINI in the last 168 hours.  BNP: Invalid input(s): POCBNP  CBG: No results for input(s): GLUCAP in the last 168 hours.  Microbiology: Results for orders placed or performed during the hospital encounter of 01/03/2019  Novel Coronavirus,NAA,(SEND-OUT TO REF LAB - TAT 24-48 hrs); Hosp Order     Status: None   Collection Time: 01/19/19 12:10 AM   Specimen: Nasopharyngeal Swab; Respiratory  Result Value Ref Range Status   SARS-CoV-2, NAA NOT DETECTED NOT DETECTED Final    Comment: (NOTE) This test was developed and its performance characteristics determined by [employer]. This test has not been FDA cleared or approved. This test has been authorized by FDA under an Emergency Use Authorization (EUA). This test is only authorized for the duration of time the declaration that circumstances exist justifying the authorization of the emergency use of in vitro diagnostic tests for detection of SARS-CoV-2 virus and/or diagnosis of COVID-19 infection under section 564(b)(1) of the Act, 21 U.S.C. KA:123727), unless the authorization is terminated or revoked sooner. When diagnostic  testing is negative, the possibility of a false negative result should be considered in the context of a patient's recent exposures and the presence of clinical signs and symptoms consistent with COVID-19. An individual without symptoms of COVID-19 and who is not shedding SARS-CoV-2 virus would expect to have a negative (not detected) result in this assay. Performed  At: [facility] [address] [place], [place] [id number] Rush Farmer MD [id number]     [place]  Final    Comment: Performed at [facility], [address] [place], [place] [address]  Urine Culture     Status: Abnormal   Collection Time: 01/31/19  2:18 PM   Specimen: Urine, Random  Result Value Ref Range Status   Specimen Description   Final    URINE, RANDOM Performed at [facility], [address] [place], [place] [address]    Special Requests   Final    NONE Performed at [facility], [address] [place], [place] [address]    Culture >=100,000 COLONIES/mL ENTEROCOCCUS FAECALIS (A)  Final   Report Status 02/02/2019 FINAL  Final   Organism ID, Bacteria ENTEROCOCCUS FAECALIS (A)  Final      Susceptibility   Enterococcus faecalis - MIC*    AMPICILLIN <=2 SENSITIVE Sensitive     LEVOFLOXACIN 0.5 SENSITIVE Sensitive     NITROFURANTOIN <=16 SENSITIVE Sensitive     VANCOMYCIN 1 SENSITIVE Sensitive     * >=100,000 COLONIES/mL ENTEROCOCCUS FAECALIS    Coagulation Studies: Recent Labs    02/06/19 0616 02/07/19 0634 02/08/19 0405  LABPROT 23.4* 23.5* 22.9*  INR 2.1* 2.1* 2.1*    Urinalysis: No results for input(s): COLORURINE, LABSPEC, PHURINE, GLUCOSEU, HGBUR, BILIRUBINUR, KETONESUR, PROTEINUR, UROBILINOGEN, NITRITE, LEUKOCYTESUR in the last 72 hours.  Invalid input(s): APPERANCEUR    Imaging: No results found.   Medications:    . acetaminophen  650 mg Oral Once  . amiodarone  200 mg Oral BID  . collagenase   Topical Daily  . mouth rinse  15 mL Mouth Rinse BID  . multivitamin-lutein  1 capsule Oral Daily  . pantoprazole  40 mg Oral Daily  . sodium chloride flush  3 mL Intravenous Q12H  . vitamin C  500 mg Oral Daily  . warfarin  3.5 mg Oral ONCE-1800  . Warfarin - Pharmacist Dosing Inpatient   Does not apply q1800   acetaminophen, acetaminophen, calcium carbonate, ipratropium-albuterol, liver oil-zinc oxide, Melatonin, menthol-cetylpyridinium **OR** phenol, metoCLOPramide **OR**  metoCLOPramide (REGLAN) injection, nystatin, ondansetron **OR** ondansetron (ZOFRAN) IV, oxyCODONE, traMADol  Assessment/ Plan:  Ms. AJA BENSEL is a 84 y.o. white female with congestive heart failure EF 25%, hypertension, atrial fibrillation, lymphedema, mitral valve replacement (mechanical valve) requiring chronic anticoagulation, COPD, osteoporosis, history of multiple lumbar compression fractures requiring kyphoplasty, history of left leg hematoma with evacuation and porcine allograft, was admitted on 12/25/2018 with right hip fracture.   1.  Acute kidney injury, chronic kidney disease stage III.  Baseline 1.37/GFR 36 from December 15, 2018 Acute renal failure may be secondary to vancomycin usage. Previously required renal replacement therapy.  -No urgent indication for dialysis at the moment.  Check random vancomycin level now.  We will also check renal ultrasound to make sure there is no underlying obstruction.  No urgent indication to restart dialysis.  2.  Anemia of chronic kidney disease.  Hemoglobin 8.7.  Continue to monitor.  Consider Epogen as well.  3.  Chronic systolic congestive heart failure with EF 25%.  Patient not on diuretics at the moment.  Hold off on adding these as renal function is deteriorating.  4.  Pulmonary hypertension with respiratory failure: requiring HFNC oxygen.   5.  Mechanical heart valve requiring chronic anticoagulation. History of endocarditis    LOS: 21 Salih Williamson 8/10/202011:49 AM

## 2019-02-08 NOTE — Progress Notes (Addendum)
Progress Note  Patient Name: [name] Date of Encounter: 02/08/2019  Primary Cardiologist: Ida Rogue, MD   Subjective   He denied chest pain, palpitations, or racing heart rate.  She did note she felt short of breath.  Inpatient Medications    Scheduled Meds:  acetaminophen  650 mg Oral Once   amiodarone  200 mg Oral BID   collagenase   Topical Daily   mouth rinse  15 mL Mouth Rinse BID   multivitamin-lutein  1 capsule Oral Daily   pantoprazole  40 mg Oral Daily   sodium chloride flush  3 mL Intravenous Q12H   vitamin C  500 mg Oral Daily   warfarin  3.5 mg Oral ONCE-1800   Warfarin - Pharmacist Dosing Inpatient   Does not apply q1800   Continuous Infusions:  PRN Meds: acetaminophen, acetaminophen, calcium carbonate, ipratropium-albuterol, liver oil-zinc oxide, Melatonin, menthol-cetylpyridinium **OR** phenol, metoCLOPramide **OR** metoCLOPramide (REGLAN) injection, nystatin, ondansetron **OR** ondansetron (ZOFRAN) IV, oxyCODONE, traMADol   Vital Signs    Vitals:   02/07/19 1610 02/07/19 2011 02/08/19 0432 02/08/19 0742  BP: 110/68 95/70 107/75 91/63  Pulse: (!) 107 (!) 106 (!) 109 (!) 108  Resp:  17 16 20   Temp: 98.2 F (36.8 C) 98.1 F (36.7 C) 97.7 F (36.5 C) 98.3 F (36.8 C)  TempSrc: Oral Oral Oral   SpO2: 94% 95% 95% 95%  Height:       No intake or output data in the 24 hours ending 02/08/19 1157 Last 3 Weights 02/08/2019 02/07/2019 02/06/2019  Weight (lbs) (No Data) (No Data) (No Data)  Weight (kg) (No Data) (No Data) (No Data)  Some encounter information is confidential and restricted. Go to Review Flowsheets activity to see all data.      Telemetry    V paced, low 100s bpm- Personally Reviewed  ECG    No new tracings- Personally Reviewed  Physical Exam   GEN:  Frail and elderly female. Cachetic    Neck:  JVD noted, JVP elevated to mandible Cardiac:  Tachycardic but regular, 2/6 systolic murmur. Softer heart sounds. No  rubs, or gallops.  Respiratory: Poor inspiratory effort with bilaterally diminished breath sounds as auscultated anteriorly as patient is too weak for posterior auscultation GI: Soft, nontender, non-distended  MS: 1+ bilateral dependent edema of thighs, b/l lower extremities wrapped in bandages; wound vac with dark fluid draining. No deformity. Neuro:  Nonfocal - somewhat disoriented in regards to timeline of events Psych: Normal affect   Labs    High Sensitivity Troponin:  No results for input(s): TROPONINIHS in the last 720 hours.    Cardiac EnzymesNo results for input(s): TROPONINI in the last 168 hours. No results for input(s): TROPIPOC in the last 168 hours.   Chemistry Recent Labs  Lab 02/06/19 0616 02/07/19 0634 02/08/19 0405  NA 136 137 137  K 4.0 3.7 3.8  CL 95* 97* 97*  CO2 29 27 27   GLUCOSE 95 107* 97  BUN 36* 41* 47*  CREATININE 2.04* 2.16* 2.28*  CALCIUM 8.9 9.0 9.0  GFRNONAA 22* 21* 19*  GFRAA 26* 24* 23*  ANIONGAP 12 13 13      Hematology Recent Labs  Lab 02/06/19 0616 02/07/19 0634 02/08/19 0405  WBC 8.6 8.0 9.0  RBC 2.92* 3.03* 3.10*  HGB 8.5* 8.7* 8.7*  HCT 28.6* 29.9* 29.9*  MCV 97.9 98.7 96.5  MCH 29.1 28.7 28.1  MCHC 29.7* 29.1* 29.1*  RDW 19.0* 19.0* 18.8*  PLT 264 254 299  BNP Recent Labs  Lab 02/02/19 1047 02/05/19 0556  BNP 1,572.0* 1,492.0*     DDimer No results for input(s): DDIMER in the last 168 hours.   Radiology    No results found.  Cardiac Studies   TTE 11/05/2018 1. The left ventricle has severely reduced systolic function, with an ejection fraction of 25-30%. The cavity size was mildly dilated. Left ventricular diastolic Doppler parameters are consistent with impaired relaxation. Left ventrical global  hypokinesis without regional wall motion abnormalities. 2. The right ventricle has mildly reduced systolic function. The cavity was moderately enlarged. There is no increase in right ventricular wall thickness.  Right ventricular systolic pressure is moderately elevated with an estimated pressure of 53 mmHg. 3. Right atrial size was moderately dilated. 4. Left atrial size was moderately dilated. 5. The aortic valve was not well visualized. Moderate calcification of the aortic valve. Aortic valve regurgitation was not assessed by color flow Doppler. Unable to estimate adegree of aortic valve stenosis given severely reduced systolic function. 6. Mechanical mitral valve. Mean gradient 5 mm Hg. 7. Tricuspid valve regurgitation is moderate. 8. Left pleural effusion noted, 6.5 cm 9. Rhythm is atrial tachycardia, rate 105 bpm  Patient Profile     84 y.o. female with a hx of atrial fibrillation, chronic combined systolic and diastolic congestive heart failure, nonischemic cardiomyopathy status post CRT-P (11/2017), left bundle branch block, hypertension, chronic lower extremity edema, mitral valve disease status post mechanical mitral valve replacement on chronic Coumadin, s/p spontaneous left calf hematoma requiring debridement, stage III chronic kidney disease, 11/2018 mechanical fall with subsequent R hip fracture and later R hip surgery ([facility]), and seen for management and optimization of cardiac medications in the setting of the above complex medical and cardiac conditions and s/p right-sided intertrochanteric femur fracture and repair at [facility].  Assessment & Plan    Paroxysmal atrial fibrillation / Atrial Tachycardia --SOB. Device interrogated 8/4 -atrial tachycardia. Etiology likely multifactorial, including fluid retention, anemia, recent right hip fracture repair, and UTI. --Continue amiodarone 200 twice daily. Per son's request, BB discontinued 8/5 and as documented in EMR. BB are an important part of guideline directed therapy for treatment of systolic heart failure and atrial arrhythmias, and we will plan to readdress with family throughout admission. --Continue anticoagulation with warfarin per  pharmacy and daily INR checks (target INR 2-3). Due to two days subtherapeutic INR's (7/27, 7/28), and given patient's comorbid conditions, DCCV is less ideal, though reportedly lower risk for cardioembolic events in slow atrial tachycardia than that of Afib/flutter. Preference to first optimize volume status with gentle diuresis. DCCV utility and timing will be assessed daily and based on careful consideration of patient risks versus benefits of the procedure.  Acute on chronic combined systolic and diastolic CHF/NICM --Patient volume up on exam today with c/o SOB.  --Continue to trend BNP throughout the admission. BNP 1492.0, elevated. --Daily BMET to assess renal function. Continue to monitor I's/O's, daily weights. --IV diuresis held due to kidney function. Creatinine continues to climb over the weekend and 1.8  2.28. Scant and dark urine output. Weights and output not well documented.  --Nephrology consulted and recommended holding off on diuretics with renal function deteriorating. Consider worsening renal function in the setting of volume overload with restart of diuretics per MD with reassessment on a daily basis.  --As above, not on BB per family request with rates in low 100s.  Blood loss anemia s/p R hip surgery with R hip hematoma / tear --Monitor with daily  CBC. Recommend transfusion for Hgb below 7.0.   Valvular heart disease s/p mechanical mitral valve --Continue Coumadin, INR checks. Target INR 2-3 (currently 2.1)  in setting of mechanical mitral valve, PAF, and spontaneous lower extremity hematomas.  For questions or updates, please contact [place] Please consult [email or web address] for contact info under        Signed, Arvil Chaco, PA-C  02/08/2019, 11:57 AM

## 2019-02-08 NOTE — TOC Progression Note (Addendum)
Transition of Care [facility]) - Progression Note    Patient Details  Name: [name] MRN: [id number] Date of Birth: 09-27-1934  Transition of Care [facility]) CM/SW Contact  Ross Ludwig, [place] Phone Number: 02/08/2019, 6:51 PM  Clinical Narrative:     CSW spoke with Doroteo Bradford at [facility], family is considering transferring to [facility] if patient is eligible.  Patient's family are trying to decide if they would like her to transfer.  Patient's daughter has been at bedside, and does not like the fact that patient can only have vistors from 2pm-6pm.  CSW to continue to follow patient's progress throughout discharge planning.   Expected Discharge Plan: (home with home health vs SNF)    Expected Discharge Plan and Services Expected Discharge Plan: (home with home health vs SNF)   Discharge Planning Services: CM Consult   Living arrangements for the past 2 months: [facility] settiings)                 DME Arranged: (Heads up to [facility] with [facility] for hospital bed. Have entered medical necissity for the bed) DME Agency: [facility]     Representative spoke with at DME Agency: [place] Arranged: RN, PT, OT, Nurse's Aide [place] Agency: [place] ([place]) Date [place]: 01/19/19       Social Determinants of Health (SDOH) Interventions    Readmission Risk Interventions Readmission Risk Prevention Plan 12/26/2018 12/07/2018 11/30/2018  Transportation Screening Complete Complete Complete  PCP or Specialist Appt within 3-5 Days - Complete -  HRI or Home Care Consult Complete Complete -  Social Work Consult for [place] Planning/Counseling Complete Not Complete -  SW consult not completed comments - NA -  Palliative Care Screening - Not Applicable -  Medication Review Press photographer) Complete Complete Complete  PCP or Specialist appointment within 3-5 days of discharge - - Complete  HRI or [place] - - Complete  SW Recovery Care/Counseling Consult - -  Not Complete  SW Consult Not Complete Comments - - na  Palliative Care Screening - - Not [place] - - Complete  Some recent data might be hidden

## 2019-02-08 NOTE — Progress Notes (Signed)
[facility] SURGICAL PROGRESS NOTE (cpt [phone])  Hospital Day(s): 21.   Post op day(s):  Marland Kitchen   Interval History: Patient seen and examined, no acute events or new complaints overnight. Patient's daughter at bedside. Patient with some confusion this afternoon. Dressing changed earlier this morning.   Review of Systems:  Unable to obtain  Vital signs in last 24 hours: [min-max] current  Temp:  [97.7 F (36.5 C)-98.3 F (36.8 C)] 98.3 F (36.8 C) (08/10 0742) Pulse Rate:  [106-109] 108 (08/10 0742) Resp:  [16-20] 20 (08/10 0742) BP: (91-110)/(63-75) 91/63 (08/10 0742) SpO2:  [94 %-95 %] 95 % (08/10 0742)     Height: 4\' 11"  (149.9 cm) Weight: (Unable to obtain due to lift scale not working) BMI (Calculated): 23.92   Intake/Output last 2 shifts:  No intake/output data recorded.   Physical Exam:  Constitutional: alert, cooperative and no distress  Respiratory: breathing non-labored at rest  Integumentary: Dressing in place, changed this morning.    Labs:  CBC Latest Ref Rng & Units 02/08/2019 02/07/2019 02/06/2019  WBC 4.0 - 10.5 K/uL 9.0 8.0 8.6  Hemoglobin 12.0 - 15.0 g/dL 8.7(L) 8.7(L) 8.5(L)  Hematocrit 36.0 - 46.0 % 29.9(L) 29.9(L) 28.6(L)  Platelets 150 - 400 K/uL 299 254 264   CMP Latest Ref Rng & Units 02/08/2019 02/07/2019 02/06/2019  Glucose 70 - 99 mg/dL 97 107(H) 95  BUN 8 - 23 mg/dL 47(H) 41(H) 36(H)  Creatinine 0.44 - 1.00 mg/dL 2.28(H) 2.16(H) 2.04(H)  Sodium 135 - 145 mmol/L 137 137 136  Potassium 3.5 - 5.1 mmol/L 3.8 3.7 4.0  Chloride 98 - 111 mmol/L 97(L) 97(L) 95(L)  CO2 22 - 32 mmol/L 27 27 29   Calcium 8.9 - 10.3 mg/dL 9.0 9.0 8.9  Total Protein 6.5 - 8.1 g/dL - - -  Total Bilirubin 0.3 - 1.2 mg/dL - - -  Alkaline Phos 38 - 126 U/L - - -  AST 15 - 41 U/L - - -  ALT 0 - 44 U/L - - -     Imaging studies: No new pertinent imaging studies   Assessment/Plan: (ICD-10's: L89.153) 84 y.o. female with pressure injury to the coccyx/sacrum    -  discussed with daughter at bedside this afternoon regarding possibility of placing wound vac to sacral pressure injury, she would like to think about this.    - Continue local wound care; frequent repositioning; santyl dressing changes daily - optimize from nutritional standpoint - mobilization as tolerated given recent ORIF; working with PT - Further management per medical team  All of the above findings and recommendations were discussed with the patient, patient's family(Daughter at bedside), and the medical team, and all of patient's and family's questions were answered to their expressed satisfaction.  -- Edison Simon, PA-C [facility] 02/08/2019, 2:12 PM [phone] M-F: 7am - 4pm

## 2019-02-08 NOTE — Consult Note (Signed)
[place] for Warfarin Dosing  Indication: Mitral Valve Replacement, A. Fib    Allergies  Allergen Reactions  . Penicillins Rash    ** daughter reports facial swelling**   Has patient had a PCN reaction causing immediate rash, facial/tongue/throat swelling, SOB or lightheadedness with hypotension: YES Has patient had a PCN reaction causing severe rash involving mucus membranes or skin necrosis: NO Has patient had a PCN reaction that required hospitalization NO Has patient had a PCN reaction occurring within the last 10 years: NO If all of the above answers are "NO", then may proceed with Cephalosporin use.  . Ace Inhibitors     Cough  . Doxycycline Nausea Only  . Hydrocodone Itching and Nausea Only  . Mercury     Other reaction(s): Unknown  . Silver Dermatitis    Severe itching  . Cephalexin Rash  . Clindamycin/Lincomycin Rash  . Nickel Rash    Patient Measurements: Height: 4\' 11"  (149.9 cm) Weight: (Unable to obtain due to lift scale not working) IBW/kg (Calculated) : 43.2   Vital Signs: Temp: 98.3 F (36.8 C) (08/10 0742) Temp Source: Oral (08/10 0432) BP: 91/63 (08/10 0742) Pulse Rate: 108 (08/10 0742)  Labs: Recent Labs    02/06/19 0616 02/07/19 0634 02/08/19 0405  HGB 8.5* 8.7* 8.7*  HCT 28.6* 29.9* 29.9*  PLT 264 254 299  LABPROT 23.4* 23.5* 22.9*  INR 2.1* 2.1* 2.1*  CREATININE 2.04* 2.16* 2.28*    Estimated Creatinine Clearance: 14.5 mL/min (A) (by C-G formula based on SCr of 2.28 mg/dL (H)).   Medical History: Past Medical History:  Diagnosis Date  . Arthritis    "back, hands, knees" (04/10/2016)  . Chronic back pain   . 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 EF 25% by echo in 08/2015 b. RHC in 08/2015 showed normal filling pressures; c. 10/2018 Echo (in setting of atrial tachycardia): EF 25-30%, impaired relaxation. Glob HK. RVSP 26mmHg. Mod dil RA/LA.  Mech MV w/ nl fxn (mean grad 84mmHg). Mod TR.   Marland Kitchen Chronic kidney disease   . Compression fracture    "several; all in my back" (04/10/2016)  . GERD (gastroesophageal reflux disease)   . Hypertension   . Lymphedema   . Pacemaker 05/2017  . PAF (paroxysmal atrial fibrillation) ([place])    a. On amio & Coumadin - CHA2DS2VASc = 4.  . S/P MVR (mitral valve replacement)    a. MVR 1994 b. redo MVR in 08/2014 - on Coumadin  . Shortness of breath dyspnea    Assessment: Pharmacy consulted for warfarin dosing and monitoring for 84 yo female with PMH of mitral valve replacement and A. Fib. Patient transferred to [facility] 7/20 PM from [place]. Patient's INR is sensitive to changes. We have been giving her a lower dose from her home dose. Patient may need to be discharged on 2 mg daily.   Home dose: warfarin 2.5 mg daily.   DDI: on amiodarone (increase INR); megace started 8/5 (may increase risk of bleeding/INR)  Spoke to MD on 7/22 regarding low Hgb- okay with continuing warfarin at lower dose.  Today's INR is therapeutic.   DATE INR DOSE 7/23 3.0 1 mg - did not receive.  7/24     2.7       2 mg  7/25 2.4 2 mg 7/26 2.2 2.5 mg 7/27     1.9       4 mg 7/28  1.9       3 mg  7/29 2.3 2.5 mg 7/30     2.9       2 mg 7/31     3.1       2 mg  8/1       4.0       Hold  8/2       3.3       1 mg 8/3 2.7 1 mg 8/4 2.5 1.5 mg - missed this dose 8/5 2.2 1.5 mg  8/6 2.2 2 mg  8/7 2.1 2.5 mg 8/8 2.1 2.5 mg 8/9 2.1 3 mg 8/10 2.1   Goal of Therapy:  INR 2.5-3.5 (ideally want INR between 2.5 to 3 per previous notes)- --Addendum 02/08/2019 @1350 . Discussed with Marrianne Mood, PA-C about INR goal of 2-3. Given h/o hematoma will use an INR goal of 2-3; per Dr.Arida preference for 2.5-3.   Monitor platelets by anticoagulation protocol: Yes   Plan:  Updated @1356  (on 02/08/19): INR level is therapeutic. Will give warfarin 2.5 mg tonight x 1 dose. Hgb 8.7  Plt 299 Daily INR ordered. Pharmacy will  continue to follow and order warfarin based on INRs  Rowland Lathe, PharmD 02/08/2019 1:50 PM

## 2019-02-08 NOTE — Progress Notes (Signed)
Unable to obtain weight this morning due to scale not functioning. Wound care provided to [facility] and sacral wounds. Patient had delirium at the beginning of the shift requiring more than 2 hours of redirecting and negotiations.Patinent's domineer is more appreciative and accepting of care. Safety maintained, bed in lowest position and call light kept within reach. Will continue to monitor and endorse.

## 2019-02-08 NOTE — Progress Notes (Signed)
Foley bag changed

## 2019-02-08 NOTE — Progress Notes (Signed)
Pharmacy Antibiotic Note  [name] is a 84 y.o. female admitted on 01/26/2019 with UTI.  Pharmacy has been consulted for Levaquin dosing.  Plan: Levaquin 750 mg IV X 1 ordered for 8/10 followed by levaquin 500 mg IV Q48H.   Height: 4\' 11"  (149.9 cm) Weight: (Unable to obtain due to lift scale not working) IBW/kg (Calculated) : 43.2  Temp (24hrs), Avg:98 F (36.7 C), Min:97.7 F (36.5 C), Max:98.3 F (36.8 C)  Recent Labs  Lab 02/03/19 1607 02/04/19 0425 02/05/19 0556 02/06/19 0616 02/07/19 0634 02/08/19 0405  WBC  --  6.7 7.6 8.6 8.0 9.0  CREATININE  --  1.83* 1.81* 2.04* 2.16* 2.28*  VANCORANDOM 12  --   --   --   --   --     Estimated Creatinine Clearance: 14.5 mL/min (A) (by C-G formula based on SCr of 2.28 mg/dL (H)).    Allergies  Allergen Reactions  . Penicillins Rash    ** daughter reports facial swelling**   Has patient had a PCN reaction causing immediate rash, facial/tongue/throat swelling, SOB or lightheadedness with hypotension: YES Has patient had a PCN reaction causing severe rash involving mucus membranes or skin necrosis: NO Has patient had a PCN reaction that required hospitalization NO Has patient had a PCN reaction occurring within the last 10 years: NO If all of the above answers are "NO", then may proceed with Cephalosporin use.  . Ace Inhibitors     Cough  . Doxycycline Nausea Only  . Hydrocodone Itching and Nausea Only  . Mercury     Other reaction(s): Unknown  . Silver Dermatitis    Severe itching  . Cephalexin Rash  . Clindamycin/Lincomycin Rash  . Nickel Rash    Antimicrobials this admission:   >>    >>   Dose adjustments this admission:   Microbiology results:  BCx:   UCx:    Sputum:    MRSA PCR:   Thank you for allowing pharmacy to be a part of this patient's care.  Marnie Fazzino D 02/08/2019 4:31 PM

## 2019-02-08 NOTE — Consult Note (Signed)
[facility] for Warfarin Dosing  Indication: Mitral Valve Replacement, A. Fib    Allergies  Allergen Reactions  . Penicillins Rash    ** daughter reports facial swelling**   Has patient had a PCN reaction causing immediate rash, facial/tongue/throat swelling, SOB or lightheadedness with hypotension: YES Has patient had a PCN reaction causing severe rash involving mucus membranes or skin necrosis: NO Has patient had a PCN reaction that required hospitalization NO Has patient had a PCN reaction occurring within the last 10 years: NO If all of the above answers are "NO", then may proceed with Cephalosporin use.  . Ace Inhibitors     Cough  . Doxycycline Nausea Only  . Hydrocodone Itching and Nausea Only  . Mercury     Other reaction(s): Unknown  . Silver Dermatitis    Severe itching  . Cephalexin Rash  . Clindamycin/Lincomycin Rash  . Nickel Rash    Patient Measurements: Height: 4\' 11"  (149.9 cm) Weight: (Unable to obtain due to lift scale not working) IBW/kg (Calculated) : 43.2   Vital Signs: Temp: 97.7 F (36.5 C) (08/10 0432) Temp Source: Oral (08/10 0432) BP: 107/75 (08/10 0432) Pulse Rate: 109 (08/10 0432)  Labs: Recent Labs    02/06/19 0616 02/07/19 0634 02/08/19 0405  HGB 8.5* 8.7* 8.7*  HCT 28.6* 29.9* 29.9*  PLT 264 254 299  LABPROT 23.4* 23.5* 22.9*  INR 2.1* 2.1* 2.1*  CREATININE 2.04* 2.16* 2.28*    Estimated Creatinine Clearance: 14.5 mL/min (A) (by C-G formula based on SCr of 2.28 mg/dL (H)).   Medical History: Past Medical History:  Diagnosis Date  . Arthritis    "back, hands, knees" (04/10/2016)  . Chronic back pain   . 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 EF 25% by echo in 08/2015 b. RHC in 08/2015 showed normal filling pressures; c. 10/2018 Echo (in setting of atrial tachycardia): EF 25-30%, impaired relaxation. Glob HK. RVSP 54mmHg. Mod dil RA/LA.  Mech MV w/ nl fxn (mean grad 33mmHg). Mod TR.   Marland Kitchen Chronic kidney disease   . Compression fracture    "several; all in my back" (04/10/2016)  . GERD (gastroesophageal reflux disease)   . Hypertension   . Lymphedema   . Pacemaker 05/2017  . PAF (paroxysmal atrial fibrillation) ([place])    a. On amio & Coumadin - CHA2DS2VASc = 4.  . S/P MVR (mitral valve replacement)    a. MVR 1994 b. redo MVR in 08/2014 - on Coumadin  . Shortness of breath dyspnea    Assessment: Pharmacy consulted for warfarin dosing and monitoring for 84 yo female with PMH of mitral valve replacement and A. Fib. Patient transferred to [facility] 7/20 PM from [place]. Patient's INR is sensitive to changes. We have been giving her a lower dose from her home dose. Patient may need to be discharged on 2 mg daily.   Home dose: warfarin 2.5 mg daily.   DDI: on amiodarone (increase INR); megace started 8/5 (may increase risk of bleeding/INR)  Spoke to MD on 7/22 regarding low Hgb- okay with continuing warfarin at lower dose.  Today's INR is subtherapeutic, likely d/t the missed dose and the affects of the held/reduced doses. Will need to be conservative with boosting dose since the patient is sensitive to dose changes. Of note, during this admission the patient had bedside debridement and there was discussion about debridement in the OR. Additionally, patient's son opts for DCCV, but  patient has not been therapeutically anticoagulated during this admission. Per cardio, patient needs to be therapeutically anticoagulated for 1 month prior to considering DCCV.   DATE INR DOSE 7/23 3.0 1 mg - did not receive.  7/24     2.7       2 mg  7/25 2.4 2 mg 7/26 2.2 2.5 mg 7/27     1.9       4 mg 7/28     1.9       3 mg  7/29 2.3 2.5 mg 7/30     2.9       2 mg 7/31     3.1       2 mg  8/1       4.0       Hold  8/2       3.3       1 mg 8/3 2.7 1 mg 8/4 2.5 1.5 mg - missed this dose 8/5 2.2 1.5 mg  8/6 2.2 2 mg  8/7 2.1 2.5  mg 8/8 2.1 2.5 mg 8/9 2.1 3 mg 8/10 2.1   Goal of Therapy:  INR 2.5-3.5 (ideally want INR between 2.5 to 3 per previous notes) Monitor platelets by anticoagulation protocol: Yes   Plan:  INR level is subtherapeutic. Will give warfarin 3.5 mg tonight x 1 dose. Hgb 8.7  Plt 299 Daily INR ordered. Pharmacy will continue to follow and order warfarin based on INRs  Rowland Lathe, PharmD 02/08/2019 7:29 AM

## 2019-02-08 NOTE — Care Management Important Message (Signed)
Important Message  Patient Details  Name: [name] MRN: [id number] Date of Birth: Jun 19, 1935   Medicare Important Message Given:  Yes     Juliann Pulse A Kathee Tumlin 02/08/2019, 2:24 PM

## 2019-02-08 NOTE — Progress Notes (Signed)
Physical Therapy Treatment Patient Details Name: [name] MRN: [id number] DOB: Aug 18, 1934 Today's Date: 02/08/2019    History of Present Illness  84 y.o. female with a known history of right hip repair at [facility] on 01/11/2019 secondary to mechanical fall at home, paroxysmal atrial fibrillation, mitral valve replacement on Coumadin, COPD, CKD stage III, hypertension, history of left lower extremity hematoma with evacuation and porcine graft performed on 12/25/2018.    PT Comments    Pt presents with deficits in strength, transfers, mobility, gait, balance, and activity tolerance.  Pt presented with increased confusion that resulted in decreased functional mobility this session.  Pt required extensive assistance with bed mobility tasks and min A initially to maintain static sitting position.  Static sitting did improve during the session to where pt was SBA sitting at the EOB.  Pt required +2 total assist to stand and to remain in standing with max cuing for sequencing.  Multiple attempts made to get pt to step posteriorly to get BOS under COM with pt unable to do so.  Pt will benefit from PT services in a SNF setting upon discharge to safely address above deficits for decreased caregiver assistance and eventual return to PLOF.     Follow Up Recommendations  SNF;LTACH     Equipment Recommendations  Other (comment)(TBD at next venue of care)    Recommendations for Other Services       Precautions / Restrictions Precautions Precautions: Fall Precaution Comments: fragile skin, sacral ulcer Restrictions Weight Bearing Restrictions: No RLE Weight Bearing: Weight bearing as tolerated    Mobility  Bed Mobility Overal bed mobility: Needs Assistance Bed Mobility: Supine to Sit;Sit to Supine     Supine to sit: Max assist;+2 for physical assistance Sit to supine: Max assist;HOB elevated;+2 for physical assistance   General bed mobility comments: Mod verbal cues for  sequencing  Transfers Overall transfer level: Needs assistance Equipment used: Rolling walker (2 wheeled) Transfers: Sit to/from Stand Sit to Stand: +2 physical assistance;Max assist         General transfer comment: Pt near total assist to remain in standing this session, unable to advance either LE  Ambulation/Gait             General Gait Details: Unable   Stairs             Wheelchair Mobility    Modified Rankin (Stroke Patients Only)       Balance Overall balance assessment: Needs assistance Sitting-balance support: Bilateral upper extremity supported Sitting balance-Leahy Scale: Poor Sitting balance - Comments: Min A for static sitting balance initially but progressed to SBA Postural control: Posterior lean Standing balance support: Bilateral upper extremity supported Standing balance-Leahy Scale: Poor Standing balance comment: Pt required +2 total assist to remain in standing                            Cognition Arousal/Alertness: Awake/alert Behavior During Therapy: Anxious Overall Cognitive Status: Impaired/Different from baseline Area of Impairment: Following commands                               General Comments: General confusion      Exercises Total Joint Exercises Heel Slides: AAROM;Both;5 reps Hip ABduction/ADduction: AAROM;Both;5 reps Straight Leg Raises: AAROM;Both;5 reps    General Comments        Pertinent Vitals/Pain Pain Assessment: 0-10 Pain Score: 9  Pain Location: Sacral pain Pain Descriptors / Indicators: Aching;Sore Pain Intervention(s): Premedicated before session;Monitored during session    Home Living                      Prior Function            PT Goals (current goals can now be found in the care plan section) Progress towards PT goals: Not progressing toward goals - comment(Increased confusion)    Frequency    Min 2X/week      PT Plan Current plan remains  appropriate    Co-evaluation              AM-PAC PT "6 Clicks" Mobility   Outcome Measure  Help needed turning from your back to your side while in a flat bed without using bedrails?: A Lot Help needed moving from lying on your back to sitting on the side of a flat bed without using bedrails?: A Lot Help needed moving to and from a bed to a chair (including a wheelchair)?: A Lot Help needed standing up from a chair using your arms (e.g., wheelchair or bedside chair)?: A Lot Help needed to walk in hospital room?: Total Help needed climbing 3-5 steps with a railing? : Total 6 Click Score: 10    End of Session Equipment Utilized During Treatment: Oxygen Activity Tolerance: Treatment limited secondary to agitation Patient left: in bed;with call bell/phone within reach;with family/visitor present;with nursing/sitter in room(Pt left with CNA working with pt in supine) Nurse Communication: Mobility status PT Visit Diagnosis: Muscle weakness (generalized) (M62.81);Difficulty in walking, not elsewhere classified (R26.2);Pain Pain - Right/Left: Right Pain - part of body: Hip     Time: [id number] PT Time Calculation (min) (ACUTE ONLY): 20 min  Charges:  $Therapeutic Activity: 8-22 mins                     D. Scott Hovanes Hymas PT, DPT 02/08/19, 1:10 PM

## 2019-02-09 DIAGNOSIS — I272 Pulmonary hypertension, unspecified: Secondary | ICD-10-CM

## 2019-02-09 DIAGNOSIS — I5043 Acute on chronic combined systolic (congestive) and diastolic (congestive) heart failure: Secondary | ICD-10-CM

## 2019-02-09 LAB — BASIC METABOLIC PANEL
Anion gap: 14 (ref 5–15)
BUN: 51 mg/dL — ABNORMAL HIGH (ref 8–23)
CO2: 26 mmol/L (ref 22–32)
Calcium: 8.7 mg/dL — ABNORMAL LOW (ref 8.9–10.3)
Chloride: 91 mmol/L — ABNORMAL LOW (ref 98–111)
Creatinine, Ser: 2.69 mg/dL — ABNORMAL HIGH (ref 0.44–1.00)
GFR calc Af Amer: 18 mL/min — ABNORMAL LOW (ref >60)
GFR calc non Af Amer: 16 mL/min — ABNORMAL LOW (ref >60)
Glucose, Bld: 97 mg/dL (ref 70–99)
Potassium: 4 mmol/L (ref 3.5–5.1)
Sodium: 131 mmol/L — ABNORMAL LOW (ref 135–145)

## 2019-02-09 LAB — CBC
HCT: 28.7 % — ABNORMAL LOW (ref 36.0–46.0)
Hemoglobin: 8.4 g/dL — ABNORMAL LOW (ref 12.0–15.0)
MCH: 28.3 pg (ref 26.0–34.0)
MCHC: 29.3 g/dL — ABNORMAL LOW (ref 30.0–36.0)
MCV: 96.6 fL (ref 80.0–100.0)
Platelets: 273 10*3/uL (ref 150–400)
RBC: 2.97 MIL/uL — ABNORMAL LOW (ref 3.87–5.11)
RDW: 18.4 % — ABNORMAL HIGH (ref 11.5–15.5)
WBC: 8.8 10*3/uL (ref 4.0–10.5)
nRBC: 0 % (ref 0.0–0.2)

## 2019-02-09 LAB — MAGNESIUM: Magnesium: 2.4 mg/dL (ref 1.7–2.4)

## 2019-02-09 LAB — PROTIME-INR
INR: 2.8 — ABNORMAL HIGH (ref 0.8–1.2)
Prothrombin Time: 28.8 seconds — ABNORMAL HIGH (ref 11.4–15.2)

## 2019-02-09 MED ORDER — FOSFOMYCIN TROMETHAMINE 3 G PO PACK
3.0000 g | PACK | Freq: Once | ORAL | Status: DC
  Filled 2019-02-09: qty 3

## 2019-02-09 MED ORDER — FUROSEMIDE 10 MG/ML IJ SOLN
10.0000 mg/h | INTRAVENOUS | Status: DC
  Administered 2019-02-09 – 2019-02-14 (×5): 10 mg/h via INTRAVENOUS
  Filled 2019-02-09 (×5): qty 25

## 2019-02-09 MED ORDER — FUROSEMIDE 10 MG/ML IJ SOLN
80.0000 mg | Freq: Once | INTRAMUSCULAR | Status: AC
  Administered 2019-02-09: 80 mg via INTRAVENOUS
  Filled 2019-02-09: qty 8

## 2019-02-09 MED ORDER — FUROSEMIDE 10 MG/ML IJ SOLN
80.0000 mg | Freq: Two times a day (BID) | INTRAMUSCULAR | Status: DC
  Administered 2019-02-09: 80 mg via INTRAVENOUS
  Filled 2019-02-09: qty 8

## 2019-02-09 MED ORDER — WARFARIN SODIUM 2 MG PO TABS
2.0000 mg | ORAL_TABLET | Freq: Once | ORAL | Status: AC
  Administered 2019-02-09: 18:00:00 2 mg via ORAL
  Filled 2019-02-09: qty 1

## 2019-02-09 NOTE — Progress Notes (Signed)
[facility] - [place] [facility]   PATIENT NAME: [name]    MR#:  [id number]  DATE OF BIRTH:  1934-12-23  SUBJECTIVE:  CHIEF COMPLAINT:  No chief complaint on file.  Patient more confused today. Being treatedc for UTI. Has evidence of fluid overlaod. Seen by cardiology . Updated daughter at bedside this am No fevers. REVIEW OF SYSTEMS:  ROS Review of Systems  Constitutional: Negative for chills and fever.  Eyes: Negative for blurred vision.  Respiratory: Negative for cough and shortness of breath.   Cardiovascular: Negative for chest pain.  Gastrointestinal: Negative for abdominal pain, diarrhea, nausea and vomiting.  Genitourinary: Negative for dysuria.  Musculoskeletal: Positive for joint pain.  Neurological: Negative for dizziness and headaches.  DRUG ALLERGIES:   Allergies  Allergen Reactions  . Penicillins Rash    ** daughter reports facial swelling**   Has patient had a PCN reaction causing immediate rash, facial/tongue/throat swelling, SOB or lightheadedness with hypotension: YES Has patient had a PCN reaction causing severe rash involving mucus membranes or skin necrosis: NO Has patient had a PCN reaction that required hospitalization NO Has patient had a PCN reaction occurring within the last 10 years: NO If all of the above answers are "NO", then may proceed with Cephalosporin use.  . Ace Inhibitors     Cough  . Doxycycline Nausea Only  . Hydrocodone Itching and Nausea Only  . Mercury     Other reaction(s): Unknown  . Silver Dermatitis    Severe itching  . Cephalexin Rash  . Clindamycin/Lincomycin Rash  . Nickel Rash   VITALS:  Blood pressure 99/74, pulse (!) 104, temperature (!) 97.3 F (36.3 C), temperature source Oral, resp. rate 19, height 4\' 11"  (1.499 m), weight 53.8 kg, SpO2 100 %. PHYSICAL EXAMINATION:  Physical Exam   HENT:  Nose: No mucosal edema.  Mouth/Throat: No oropharyngeal exudate or posterior  oropharyngeal edema.  Eyes: Pupils are equal, round, and reactive to light. Conjunctivae, EOM and lids are normal.  Neck: No JVD present. Carotid bruit is not present. No edema present. No thyroid mass and no thyromegaly present.  Cardiovascular: S1 normal and S2 normal.  Murmur heard.  Systolic murmur is present with a grade of 3/6. tachycardia  Respiratory: No respiratory distress.no wheezing.  No rales  GI: Soft. Bowel sounds are normal. There is no abdominal tenderness.  Musculoskeletal:     Right ankle: She exhibits swelling.     Left ankle: She exhibits swelling.     Comments: Right hip status post surgery with wound VAC and edema  Lymphadenopathy:    She has no cervical adenopathy.  Neurological: She is alert.  Generalized weakness, moves both upper limbs - power 3/5, both lower limbs 2-3/5  Skin: Skin is warm. Nails show no clubbing.  Sacral decubitus ulcer now unstageable Psychiatric: She has a normal mood and affect.  Foley catheter in place, skin lesions on leg and sacrum.  LABORATORY PANEL:  Female CBC Recent Labs  Lab 02/09/19 0508  WBC 8.8  HGB 8.4*  HCT 28.7*  PLT 273   ------------------------------------------------------------------------------------------------------------------ Chemistries  Recent Labs  Lab 02/09/19 0508  NA 131*  K 4.0  CL 91*  CO2 26  GLUCOSE 97  BUN 51*  CREATININE 2.69*  CALCIUM 8.7*  MG 2.4   RADIOLOGY:  Dg Chest Port 1 View  Result Date: 02/08/2019 CLINICAL DATA:  Short of breath. EXAM: PORTABLE CHEST 1 VIEW COMPARISON:  01/19/2019 and earlier exams. FINDINGS: Bilateral  interstitial and hazy airspace opacities appear increased compared to the prior study, although this apparent change may be due to the lower lung volumes on the current study. Possible small effusions.  No evidence of a pneumothorax. There stable changes from previous cardiac surgery and valve replacement. Left anterior chest wall biventricular  cardioverter-defibrillator is stable. IMPRESSION: 1. Bilateral interstitial and hazy airspace opacities. Lung appearance is similar to the most recent prior exam allowing for lower lung volumes on the current exam. Suspect small effusions. Etiology of the lung findings is likely due to congestive heart failure. Electronically Signed   By: Amie Portland M.D.   On: 02/08/2019 19:41   ASSESSMENT AND PLAN:   Acute on chronic combined systolic and diastolic heart failure.   Seen by cardio today. Has developed fluid overload Given IV lasix bolus and started on lasix drip. Nephrol to discuss further with family tomorrow regarding initiating CRRT   paroxysmal atrial fibrillation with some atrial tachycardia and history of mitral valve replacement on amiodarone and Coumadin- Pharmacy to manage dose for therapeutic INR.  INR at 2.8.  Patient on amiodarone discontinued beta-blocker in view of dizzy spells in the past    Acute blood loss anemia secondary to large right hip hematoma and skin tear right leg.  Also has other hematomas on the legs. Hemoglobin stable. Monitor.  Hemoglobin at 8.2 --8.1--8.5-8.4 will repeat labs in a.m. Hip fracture status post operative repair at [facility].  With hematoma physical therapy recommending rehab.  Family wants patient to go home once clinically more stable and has enough endurance and strength.  Do not want SNF placement.  Continue wound VAC, care per orhtopedics dr.Menz  Weakness. Status post physical therapy evaluation-suggested rehab placement but family does not want her to go to rehab and would like to take her home once she is able to transfer from bed to commode or wheelchair.  Agreeable to get  LTAC information    As patient had long hospital stays and has significant weakness, she is still requiring 2+ assist stand up and it does not seem possible in the near future that she might be able to have that much improvement and to move without significant support. I discussed  with daughter that whenever she goes home, she will need likely 24X7 care and personal aid. AKI on Chronic kidney disease stage III.  With cardiorenal component and vancomycin, closely monitor with diuresis lasix drip. IV vancomycin previously. Family to decide on CRRT by tomorrow sacral decubiti-unstageable with some tunneling.  Newly developing right heel pressure ulcer pressure pads applied.   Surgery following patient.  wound care.  Encouraged to reposition patient frequently-every 1-2 hours Large right hip hematoma.  Needs to be on Coumadin secondary to metallic mitral valve.  Continue to monitor.  Patient has been followed by orthopedics here .  Wound VAC placed.F/U with ortho regarding wound vac continuity as drainage has decreased. Skin tear right lower extremity, left upper extremity supportive care.  Daily wound dressings and wound care team follow-up.Wound care dressings every alternate day. 10.  Altered mental status- metabolic encephalopathy -from acute cystitis, Has chronic foley catheter.  Levaquin d/ced due to prolonged QTC. Repeating a dose of fosfomycin today.  Social worker provided [employer].    Patient's daughter wishes to look into option of LTAC on discharge  All the records are reviewed and case discussed with Care Management/Social Worker. Management plans discussed with the patient, family and they are in agreement. Updated daughter present at bedside  on treatment plans.  She agrees with plan of care  CODE STATUS: DNR  TOTAL TIME TAKING CARE OF THIS PATIENT: 37 minutes.   More than 50% of the time was spent in counseling/coordination of care: YES  POSSIBLE D/C IN 2 DAYS, DEPENDING ON CLINICAL CONDITION.   Rudolph Dobler M.D on 02/09/2019 at 1:43 PM  Between 7am to 6pm - Pager - [phone]  After 6pm go to [email or web address] - Social research officer, government  [facility] [facility]  Office  [phone]  CC: Primary care physician; Danella Penton, MD  Note:  This dictation was prepared with Dragon dictation along with smaller phrase technology. Any transcriptional errors that result from this process are unintentional.

## 2019-02-09 NOTE — Progress Notes (Signed)
Notify Dr. Stark Jock, Dr. Holley Raring and Dr. Saunders Revel about patient's decrease urine output only has 100 cc even she received 80 mg IV lasix this morning. Bladder scanned her make sure she is not retaining and it was 5 cc in her bladder. Per Dr. Elwyn Lade note possible CRRT in the future, MD say that he will discuss this tomorrow with the patient's family. Dr. Saunders Revel place an order to give 80 mg IV lasix bolus and to start her on lasix drip, MD aware that patient's SBP has been soft in the upper 90's. Started lasix drip at 10 ml/hr and gave 80 mg IV lasix push. Patient's daughter [name] at bedside and updated her about the plan of care for this patient.

## 2019-02-09 NOTE — Progress Notes (Signed)
Physical Therapy Treatment Patient Details Name: [name] MRN: [id number] DOB: 1935-03-09 Today's Date: 02/09/2019    History of Present Illness  84 y.o. female with a known history of right hip repair at [facility] on 01/11/2019 secondary to mechanical fall at home, paroxysmal atrial fibrillation, mitral valve replacement on Coumadin, COPD, CKD stage III, hypertension, history of left lower extremity hematoma with evacuation and porcine graft performed on 12/25/2018.    PT Comments    Pt presents with deficits in strength, transfers, mobility, gait, balance, and activity tolerance.  Pt with general confusion this session but able to follow simple one step commands with extra cuing.  Pt required extensive assistance with all bed mobility tasks and was +2 total assist with transfers.  Upon standing pt required +2 total assist to remain standing and was unable to advance either LE during amb attempts.  Pt will benefit from PT services in a SNF setting upon discharge to safely address above deficits for decreased caregiver assistance and eventual return to PLOF.     Follow Up Recommendations  SNF;LTACH     Equipment Recommendations  Other (comment)(TBD at next venue of care)    Recommendations for Other Services       Precautions / Restrictions Precautions Precautions: Fall Precaution Comments: fragile skin, sacral ulcer Restrictions Weight Bearing Restrictions: Yes RLE Weight Bearing: Weight bearing as tolerated    Mobility  Bed Mobility Overal bed mobility: Needs Assistance Bed Mobility: Supine to Sit;Sit to Supine     Supine to sit: Max assist;+2 for physical assistance Sit to supine: Max assist;HOB elevated;+2 for physical assistance   General bed mobility comments: Mod verbal cues for sequencing and physical assistance provided for pt to reach for the bed rail  Transfers Overall transfer level: Needs assistance Equipment used: Rolling walker (2  wheeled) Transfers: Sit to/from Stand Sit to Stand: +2 physical assistance;Total assist         General transfer comment: Pt total assist to remain in standing this session, unable to advance either LE  Ambulation/Gait             General Gait Details: Unable   Stairs             Wheelchair Mobility    Modified Rankin (Stroke Patients Only)       Balance Overall balance assessment: Needs assistance Sitting-balance support: Bilateral upper extremity supported Sitting balance-Leahy Scale: Poor Sitting balance - Comments: Min A for static sitting balance Postural control: Posterior lean;Left lateral lean   Standing balance-Leahy Scale: Zero Standing balance comment: Pt required +2 total assist to remain in standing                            Cognition Arousal/Alertness: Awake/alert Behavior During Therapy: Anxious Overall Cognitive Status: Impaired/Different from baseline Area of Impairment: Following commands                       Following Commands: Follows one step commands inconsistently       General Comments: General confusion      Exercises Other Exercises Other Exercises: General BLE AAROM/PROM to BLEs in supine    General Comments        Pertinent Vitals/Pain Pain Assessment: 0-10 Pain Score: 7  Pain Location: Sacral pain Pain Descriptors / Indicators: Aching;Sore Pain Intervention(s): Monitored during session;Premedicated before session    Home Living  Prior Function            PT Goals (current goals can now be found in the care plan section) Progress towards PT goals: Not progressing toward goals - comment(Pt limited by global weakness and confusion)    Frequency    Min 2X/week      PT Plan Current plan remains appropriate    Co-evaluation              AM-PAC PT "6 Clicks" Mobility   Outcome Measure  Help needed turning from your back to your side while in  a flat bed without using bedrails?: A Lot Help needed moving from lying on your back to sitting on the side of a flat bed without using bedrails?: A Lot Help needed moving to and from a bed to a chair (including a wheelchair)?: Total Help needed standing up from a chair using your arms (e.g., wheelchair or bedside chair)?: Total Help needed to walk in hospital room?: Total Help needed climbing 3-5 steps with a railing? : Total 6 Click Score: 8    End of Session Equipment Utilized During Treatment: Gait belt;Oxygen Activity Tolerance: Patient tolerated treatment well Patient left: in bed;with call bell/phone within reach;with bed alarm set Nurse Communication: Mobility status PT Visit Diagnosis: Muscle weakness (generalized) (M62.81);Difficulty in walking, not elsewhere classified (R26.2);Pain Pain - Right/Left: Right Pain - part of body: Hip     Time: [id number] PT Time Calculation (min) (ACUTE ONLY): 24 min  Charges:  $Therapeutic Exercise: 8-22 mins $Therapeutic Activity: 8-22 mins                     D. Scott Kyree Adriano PT, DPT 02/09/19, 1:06 PM

## 2019-02-09 NOTE — TOC Progression Note (Signed)
Transition of Care [facility]) - Progression Note    Patient Details  Name: [name] MRN: [id number] Date of Birth: April 05, 1935  [facility] CM/SW Contact  Shade Flood, LCSW Phone Number: 02/09/2019, 11:55 AM  Clinical Narrative:     TOC following. [name] status discussed in Progression today. Pt is reportedly more confused and tremulous today. Nephrology consulting and pt may need dialysis per RN. Pt reportedly not stable for transfer to LTAC at this time. Family has not decided if they are agreeable to [facility] or not. TOC will follow up when pt stabilizes to determine if LTAC transfer remains an option.   Expected Discharge Plan: (home with home health vs SNF)    Expected Discharge Plan and Services Expected Discharge Plan: (home with home health vs SNF)   Discharge Planning Services: CM Consult   Living arrangements for the past 2 months: [facility] settiings)                 DME Arranged: (Heads up to [facility] with [employer] for hospital bed. Have entered medical necissity for the bed) DME Agency: [employer]     Representative spoke with at DME Agency: [place] Arranged: RN, PT, OT, Nurse's Aide [place] Agency: [place]) Date [place]: 01/19/19       Social Determinants of Health (SDOH) Interventions    Readmission Risk Interventions Readmission Risk Prevention Plan 12/26/2018 12/07/2018 11/30/2018  Transportation Screening Complete Complete Complete  PCP or Specialist Appt within 3-5 Days - Complete -  HRI or Home Care Consult Complete Complete -  Social Work Consult for [place] Planning/Counseling Complete Not Complete -  SW consult not completed comments - NA -  Palliative Care Screening - Not Applicable -  Medication Review Press photographer) Complete Complete Complete  PCP or Specialist appointment within 3-5 days of discharge - - Complete  HRI or [place] - - Complete  SW Recovery Care/Counseling Consult - - Not  Complete  SW Consult Not Complete Comments - - na  Palliative Care Screening - - Not [place] - - Complete  Some recent data might be hidden

## 2019-02-09 NOTE — Progress Notes (Signed)
[facility] SURGICAL PROGRESS NOTE (cpt [phone])  Hospital Day(s): 22.   Post op day(s):  Marland Kitchen   Interval History: Patient seen and examined, no acute events or new complaints overnight. Patient's daughter at bedside. She is still uncertain regarding wound vac as patient reportedly may require dialysis. No issues with wound changes.   Review of Systems:  Unable to obtain secondary to mental acuity  Vital signs in last 24 hours: [min-max] current  Temp:  [97.3 F (36.3 C)-97.9 F (36.6 C)] 97.3 F (36.3 C) (08/11 0758) Pulse Rate:  [102-106] 104 (08/11 0801) Resp:  [16-19] 19 (08/11 0758) BP: (94-118)/(68-89) 99/74 (08/11 0758) SpO2:  [90 %-100 %] 100 % (08/11 0801)     Height: 4\' 11"  (149.9 cm) Weight: (unable to get weight due to lift scale not working) BMI (Calculated): 23.92   Intake/Output last 2 shifts:  08/10 0701 - 08/11 0700 In: -  Out: 250 [Urine:250]   Physical Exam:  Constitutional: alert, cooperative and no distress   Respiratory: breathing non-labored at rest, congested cough  Integumentary: 3 x 3 cm sacral pressure injury, red granulation tissue with minimal fibrinous tissue, coccyx palpable but not exposed.   Wound (02/09/2019):      Labs:  CBC Latest Ref Rng & Units 02/09/2019 02/08/2019 02/07/2019  WBC 4.0 - 10.5 K/uL 8.8 9.0 8.0  Hemoglobin 12.0 - 15.0 g/dL 8.4(L) 8.7(L) 8.7(L)  Hematocrit 36.0 - 46.0 % 28.7(L) 29.9(L) 29.9(L)  Platelets 150 - 400 K/uL 273 299 254   CMP Latest Ref Rng & Units 02/09/2019 02/08/2019 02/07/2019  Glucose 70 - 99 mg/dL 97 97 107(H)  BUN 8 - 23 mg/dL 51(H) 47(H) 41(H)  Creatinine 0.44 - 1.00 mg/dL 2.69(H) 2.28(H) 2.16(H)  Sodium 135 - 145 mmol/L 131(L) 137 137  Potassium 3.5 - 5.1 mmol/L 4.0 3.8 3.7  Chloride 98 - 111 mmol/L 91(L) 97(L) 97(L)  CO2 22 - 32 mmol/L 26 27 27   Calcium 8.9 - 10.3 mg/dL 8.7(L) 9.0 9.0  Total Protein 6.5 - 8.1 g/dL - - -  Total Bilirubin 0.3 - 1.2 mg/dL - - -  Alkaline Phos 38 - 126 U/L  - - -  AST 15 - 41 U/L - - -  ALT 0 - 44 U/L - - -     Imaging studies: No new pertinent imaging studies   Assessment/Plan: (ICD-10's: L69.153) 84 y.o. female with sacral pressure injury currently undergoing santyl dressing.    - Daughter still uncertain regarding wound vac  - Continue current dressng changes as scheduled   - Continue local wound care; frequent repositioning; santyl dressing changes daily - optimize from nutritional standpoint - mobilization as tolerated given recent ORIF; working with PT - Further management per medical team  All of the above findings and recommendations were discussed with the patient, patient's family(Daughter at bedside), and the medical team, and all of patient's and family's questions were answered to their expressed satisfaction.  -- Edison Simon, PA-C [facility] 02/09/2019, 9:56 AM [phone] M-F: 7am - 4pm

## 2019-02-09 NOTE — Progress Notes (Signed)
Notify Dr. Stark Jock about patient's scheduled fosfomycin, patient is only drinking minimal fluids today, and unable to swallow fluids if mix with water or mixed with apple sauce. Asked if we have other medication to substitute this, but per MD there are limited medication to give patient because of her allergies. Patient's daughter is refusing now for me to give this medication as patient is also confused.

## 2019-02-09 NOTE — Progress Notes (Signed)
[facility]  ROUNDING NOTE   Subjective:  Patient appears to have worsening renal status. Urine output yesterday was only 250 cc. Cardiology has started the patient on Lasix 80 mg IV twice daily. Discussed the potential for renal placement therapy to be reinitiated with the patient's daughter. She wanted to consult with her brother regarding this.   Objective:  Vital signs in last 24 hours:  Temp:  [97.3 F (36.3 C)-97.9 F (36.6 C)] 97.3 F (36.3 C) (08/11 0758) Pulse Rate:  [102-106] 104 (08/11 0801) Resp:  [16-19] 19 (08/11 0758) BP: (94-118)/(68-89) 99/74 (08/11 0758) SpO2:  [90 %-100 %] 100 % (08/11 0801)  Weight change:  Filed Weights    Intake/Output: I/O last 3 completed shifts: In: -  Out: 250 [Urine:250]   Intake/Output this shift:  Total I/O In: 0  Out: 100 [Urine:100]  Physical Exam: General: Chronically ill-appearing  Head: Normocephalic, atraumatic. Moist oral mucosal membranes  Eyes: Anicteric  Neck: Supple, trachea midline  Lungs:  Bilateral crackles at bases, normal effort  Heart: Regular rate and rhythm, paced  Abdomen:  Soft, nontender  Extremities: ++ peripheral edema, legs wrapped  Neurologic: Nonfocal, moving all four extremities  Skin: No lesions  Access: none    Basic Metabolic Panel: Recent Labs  Lab 02/05/19 0556 02/06/19 0616 02/07/19 0634 02/08/19 0405 02/09/19 0508  NA 134* 136 137 137 131*  K 3.8 4.0 3.7 3.8 4.0  CL 94* 95* 97* 97* 91*  CO2 28 29 27 27 26   GLUCOSE 90 95 107* 97 97  BUN 34* 36* 41* 47* 51*  CREATININE 1.81* 2.04* 2.16* 2.28* 2.69*  CALCIUM 8.8* 8.9 9.0 9.0 8.7*  MG  --   --   --   --  2.4    Liver Function Tests: No results for input(s): AST, ALT, ALKPHOS, BILITOT, PROT, ALBUMIN in the last 168 hours. No results for input(s): LIPASE, AMYLASE in the last 168 hours. No results for input(s): AMMONIA in the last 168 hours.  CBC: Recent Labs  Lab 02/05/19 0556 02/06/19 0616  02/07/19 0634 02/08/19 0405 02/09/19 0508  WBC 7.6 8.6 8.0 9.0 8.8  NEUTROABS  --   --   --  7.7  --   HGB 8.1* 8.5* 8.7* 8.7* 8.4*  HCT 27.5* 28.6* 29.9* 29.9* 28.7*  MCV 97.2 97.9 98.7 96.5 96.6  PLT 247 264 254 299 273    Cardiac Enzymes: No results for input(s): CKTOTAL, CKMB, CKMBINDEX, TROPONINI in the last 168 hours.  BNP: Invalid input(s): POCBNP  CBG: No results for input(s): GLUCAP in the last 168 hours.  Microbiology: Results for orders placed or performed during the hospital encounter of 01/09/2019  Novel Coronavirus,NAA,(SEND-OUT TO REF LAB - TAT 24-48 hrs); Hosp Order     Status: None   Collection Time: 01/19/19 12:10 AM   Specimen: Nasopharyngeal Swab; Respiratory  Result Value Ref Range Status   SARS-CoV-2, NAA NOT DETECTED NOT DETECTED Final    Comment: (NOTE) This test was developed and its performance characteristics determined by [employer]. This test has not been FDA cleared or approved. This test has been authorized by FDA under an Emergency Use Authorization (EUA). This test is only authorized for the duration of time the declaration that circumstances exist justifying the authorization of the emergency use of in vitro diagnostic tests for detection of SARS-CoV-2 virus and/or diagnosis of COVID-19 infection under section 564(b)(1) of the Act, 21 U.S.C. [id number]), unless the authorization is terminated or revoked sooner. When  diagnostic testing is negative, the possibility of a false negative result should be considered in the context of a patient's recent exposures and the presence of clinical signs and symptoms consistent with COVID-19. An individual without symptoms of COVID-19 and who is not shedding SARS-CoV-2 virus would expect to have a negative (not detected) result in this assay. Performed  At: [facility] [address] [place], [place] [id number] Rush Farmer MD [id number]    [place]   Final    Comment: Performed at [facility], [address] [place], [place] [address]  Urine Culture     Status: Abnormal   Collection Time: 01/31/19  2:18 PM   Specimen: Urine, Random  Result Value Ref Range Status   Specimen Description   Final    URINE, RANDOM Performed at [facility], [address] [place], [place] [address]    Special Requests   Final    NONE Performed at [facility], [place], [place] [address]    Culture >=100,000 COLONIES/mL ENTEROCOCCUS FAECALIS (A)  Final   Report Status 02/02/2019 FINAL  Final   Organism ID, Bacteria ENTEROCOCCUS FAECALIS (A)  Final      Susceptibility   Enterococcus faecalis - MIC*    AMPICILLIN <=2 SENSITIVE Sensitive     LEVOFLOXACIN 0.5 SENSITIVE Sensitive     NITROFURANTOIN <=16 SENSITIVE Sensitive     VANCOMYCIN 1 SENSITIVE Sensitive     * >=100,000 COLONIES/mL ENTEROCOCCUS FAECALIS    Coagulation Studies: Recent Labs    02/07/19 0634 02/08/19 0405 02/09/19 0508  LABPROT 23.5* 22.9* 28.8*  INR 2.1* 2.1* 2.8*    Urinalysis: Recent Labs    02/08/19 1444  COLORURINE AMBER*  LABSPEC 1.017  PHURINE 5.0  GLUCOSEU NEGATIVE  HGBUR SMALL*  BILIRUBINUR NEGATIVE  KETONESUR NEGATIVE  PROTEINUR 100*  NITRITE NEGATIVE  LEUKOCYTESUR LARGE*      Imaging: Dg Chest Port 1 View  Result Date: 02/08/2019 CLINICAL DATA:  Short of breath. EXAM: PORTABLE CHEST 1 VIEW COMPARISON:  01/19/2019 and earlier exams. FINDINGS: Bilateral interstitial and hazy airspace opacities appear increased compared to the prior study, although this apparent change may be due to the lower lung volumes on the current study. Possible small effusions.  No evidence of a pneumothorax. There stable changes from previous cardiac surgery and valve replacement. Left anterior chest wall biventricular cardioverter-defibrillator is stable. IMPRESSION: 1. Bilateral interstitial and hazy airspace opacities. Lung  appearance is similar to the most recent prior exam allowing for lower lung volumes on the current exam. Suspect small effusions. Etiology of the lung findings is likely due to congestive heart failure. Electronically Signed   By: Lajean Manes M.D.   On: 02/08/2019 19:41     Medications:   . [START ON 02/10/2019] levofloxacin (LEVAQUIN) IV     . acetaminophen  650 mg Oral Once  . amiodarone  200 mg Oral BID  . collagenase   Topical Daily  . furosemide  80 mg Intravenous BID  . mouth rinse  15 mL Mouth Rinse BID  . multivitamin-lutein  1 capsule Oral Daily  . pantoprazole  40 mg Oral Daily  . sodium chloride flush  3 mL Intravenous Q12H  . vitamin C  500 mg Oral Daily  . warfarin  2 mg Oral ONCE-1800  . Warfarin - Pharmacist Dosing Inpatient   Does not apply q1800   acetaminophen, acetaminophen, calcium carbonate, ipratropium-albuterol, liver oil-zinc oxide, Melatonin, menthol-cetylpyridinium **OR** phenol, metoCLOPramide **OR** metoCLOPramide (  REGLAN) injection, nystatin, ondansetron **OR** ondansetron (ZOFRAN) IV, oxyCODONE, traMADol  Assessment/ Plan:  [name] is a 84 y.o. white female with congestive heart failure EF 25%, hypertension, atrial fibrillation, lymphedema, mitral valve replacement (mechanical valve) requiring chronic anticoagulation, COPD, osteoporosis, history of multiple lumbar compression fractures requiring kyphoplasty, history of left leg hematoma with evacuation and porcine allograft, was admitted on 12/25/2018 with right hip fracture.   1.  Acute kidney injury, chronic kidney disease stage III.  Baseline 1.37/GFR 36 from December 15, 2018 Acute renal failure may be secondary to vancomycin usage. Previously required renal replacement therapy.  -Patient appears to have worsening respiratory and renal status.  Urine output was only 250 cc over the preceding 24 hours.  She has been started on furosemide 80 mg IV twice daily in effort to increase diuresis.  We  had a discussion regarding the potential for renal placement therapy should her renal function continued to deteriorate or she does not respond to diuresis.  Overall she would not make a good long-term dialysis candidate however this could be considered temporarily to see if we can improve her underlying respiratory and cardiac status.  2.  Anemia of chronic kidney disease.  Hemoglobin down to 8.4.  We will continue to consider Epogen though not immediately necessary.  3.  Chronic systolic congestive heart failure with EF 25%.  P diuretics have been added by cardiology.  Currently receiving Lasix 80 mg IV twice daily.  If this does not work well which is our suspicion we may need to consider renal replacement therapy as above.  4.  Pulmonary hypertension with respiratory failure: Patient currently on nasal cannula.  5.  Mechanical heart valve requiring chronic anticoagulation. History of endocarditis    LOS: 63 Adaja Wander 8/11/20201:52 PM

## 2019-02-09 NOTE — Consult Note (Addendum)
[facility] Dosing  Indication: Mitral Valve Replacement, A. Fib    Allergies  Allergen Reactions  . Penicillins Rash    ** daughter reports facial swelling**   Has patient had a PCN reaction causing immediate rash, facial/tongue/throat swelling, SOB or lightheadedness with hypotension: YES Has patient had a PCN reaction causing severe rash involving mucus membranes or skin necrosis: NO Has patient had a PCN reaction that required hospitalization NO Has patient had a PCN reaction occurring within the last 10 years: NO If all of the above answers are "NO", then may proceed with Cephalosporin use.  . Ace Inhibitors     Cough  . Doxycycline Nausea Only  . Hydrocodone Itching and Nausea Only  . Mercury     Other reaction(s): Unknown  . Silver Dermatitis    Severe itching  . Cephalexin Rash  . Clindamycin/Lincomycin Rash  . Nickel Rash    Patient Measurements: Height: 4\' 11"  (149.9 cm) Weight: (unable to get weight due to lift scale not working) IBW/kg (Calculated) : 43.2   Vital Signs: Temp: 97.3 F (36.3 C) (08/11 0758) Temp Source: Oral (08/11 0758) BP: 99/74 (08/11 0758) Pulse Rate: 104 (08/11 0801)  Labs: Recent Labs    02/07/19 0634 02/08/19 0405 02/09/19 0508  HGB 8.7* 8.7* 8.4*  HCT 29.9* 29.9* 28.7*  PLT 254 299 273  LABPROT 23.5* 22.9* 28.8*  INR 2.1* 2.1* 2.8*  CREATININE 2.16* 2.28* 2.69*    Estimated Creatinine Clearance: 12.3 mL/min (A) (by C-G formula based on SCr of 2.69 mg/dL (H)).   Medical History: Past Medical History:  Diagnosis Date  . Arthritis    "back, hands, knees" (04/10/2016)  . Chronic back pain   . 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 EF 25% by echo in 08/2015 b. RHC in 08/2015 showed normal filling pressures; c. 10/2018 Echo (in setting of atrial tachycardia): EF 25-30%, impaired relaxation. Glob HK. RVSP 30mmHg. Mod dil RA/LA.  Mech MV w/ nl fxn (mean grad 81mmHg). Mod TR.   Marland Kitchen Chronic kidney disease   . Compression fracture    "several; all in my back" (04/10/2016)  . GERD (gastroesophageal reflux disease)   . Hypertension   . Lymphedema   . Pacemaker 05/2017  . PAF (paroxysmal atrial fibrillation) ([place])    a. On amio & Coumadin - CHA2DS2VASc = 4.  . S/P MVR (mitral valve replacement)    a. MVR 1994 b. redo MVR in 08/2014 - on Coumadin  . Shortness of breath dyspnea    Assessment: Pharmacy consulted for warfarin dosing and monitoring for 84 yo female with PMH of mitral valve replacement and A. Fib. Patient transferred to [facility] 7/20 PM from [place]. Patient's INR is sensitive to changes. We have been giving her a lower dose from her home dose. Patient may need to be discharged on 2 mg daily.   Home dose: warfarin 2.5 mg daily.   DDI: on amiodarone (increase INR); levofloxacin 8/10 started (may significantly increase INR)  Spoke to MD on 7/22 regarding low Hgb- okay with continuing warfarin at lower dose.  Today's INR is therapeutic. However, the INR significantly increased likely due to recent boosted doses, starting a flouroquinolone yesterday, CHF (fluid changes). Given patient is on a flouroquinolone, will need to preemptively reduce dose.    DATE INR DOSE 7/23 3.0 1 mg - did not receive.  7/24     2.7  2 mg  7/25 2.4 2 mg 7/26 2.2 2.5 mg 7/27     1.9       4 mg 7/28     1.9       3 mg  7/29 2.3 2.5 mg 7/30     2.9       2 mg 7/31     3.1       2 mg  8/1       4.0       Hold  8/2       3.3       1 mg 8/3 2.7 1 mg 8/4 2.5 1.5 mg - missed this dose 8/5 2.2 1.5 mg  8/6 2.2 2 mg  8/7 2.1 2.5 mg 8/8 2.1 2.5 mg 8/9 2.1 3 mg 8/10 2.1 2.5 mg  8/11 2.8   Goal of Therapy:  INR 2.5-3.5 (ideally want INR between 2.5 to 3 per previous notes)- --Addendum 02/08/2019 @1350 . Discussed with Marrianne Mood, PA-C about INR goal of 2-3. Given h/o hematoma will use an INR goal of 2-3; per Dr.Arida  preference for 2.5-3.   Monitor platelets by anticoagulation protocol: Yes   Plan:  INR level is therapeutic. Will give warfarin 2 mg tonight x 1 dose (20% reduction of home dose). Hgb 8.4  Plt 273 Daily INR ordered. Pharmacy will continue to follow and order warfarin based on INRs  Rowland Lathe, PharmD 02/09/2019 8:06 AM

## 2019-02-09 NOTE — Consult Note (Signed)
ORTHOPAEDICS: The patient is awake and alert this morning but somewhat confused.  No family are present.  Examination of the right hip shows the wound VAC to be in place.  No erythema.  The wound VAC has 250 mL of fluid in the reservoir.  Impression:  Status post ORIF of right intertrochanteric femur fracture (performed at [facility]) Right hip hematoma  Plan: The wound VAC was reapplied this weekend due to increased drainage.  I was unable to see a daily summary of the output, so I assume the 250 mL in the wound VAC reservoir is from 2 days ago.  We will continue with the wound VAC at the present time and monitor the output.  Marthann Abshier P. Holley Bouche M.D.

## 2019-02-09 NOTE — Progress Notes (Addendum)
Pharmacy Antibiotic Note  [name] is a 84 y.o. female admitted on 01/22/2019 with UTI.  Pharmacy has been consulted for Levaquin dosing.  DDI: levofloxacin + amiodarone, may cause QTC pronlongation.   8/11 EKG: QT 416; QTC 560  Plan: Levaquin 750 mg IV X 1 ordered for 8/10 followed by levaquin 500 mg IV Q48H.   Height: 4\' 11"  (149.9 cm) Weight: (unable to get weight due to lift scale not working) IBW/kg (Calculated) : 43.2  Temp (24hrs), Avg:97.7 F (36.5 C), Min:97.3 F (36.3 C), Max:97.9 F (36.6 C)  Recent Labs  Lab 02/03/19 1607  02/05/19 0556 02/06/19 0616 02/07/19 0634 02/08/19 0405 02/09/19 0508  WBC  --    < > 7.6 8.6 8.0 9.0 8.8  CREATININE  --    < > 1.81* 2.04* 2.16* 2.28* 2.69*  VANCORANDOM 12  --   --   --   --   --   --    < > = values in this interval not displayed.    Estimated Creatinine Clearance: 12.3 mL/min (A) (by C-G formula based on SCr of 2.69 mg/dL (H)).    Allergies  Allergen Reactions  . Penicillins Rash    ** daughter reports facial swelling**   Has patient had a PCN reaction causing immediate rash, facial/tongue/throat swelling, SOB or lightheadedness with hypotension: YES Has patient had a PCN reaction causing severe rash involving mucus membranes or skin necrosis: NO Has patient had a PCN reaction that required hospitalization NO Has patient had a PCN reaction occurring within the last 10 years: NO If all of the above answers are "NO", then may proceed with Cephalosporin use.  . Ace Inhibitors     Cough  . Doxycycline Nausea Only  . Hydrocodone Itching and Nausea Only  . Mercury     Other reaction(s): Unknown  . Silver Dermatitis    Severe itching  . Cephalexin Rash  . Clindamycin/Lincomycin Rash  . Nickel Rash   WBC 8> 9> 8.8  Microbiology results: UCx:  E. Faecalis  Thank you for allowing pharmacy to be a part of this patient's care.  Rowland Lathe 02/09/2019 9:51 AM

## 2019-02-09 NOTE — Progress Notes (Signed)
OT Cancellation Note  Patient Details Name: [name] MRN: [id number] DOB: 1934-10-28   Cancelled Treatment:    Reason Eval/Treat Not Completed: Patient declined, no reason specified(pt. and daughter declined OT intervention today. Pt.'s daughter reports that it has been a bad day for the pt. today, and request to hold on the OT session this afternoon. Will continue to monitor, and attempt OT treatment on a later date.)  Harrel Carina, MS, OTR/L 02/09/2019, 4:40 PM

## 2019-02-09 NOTE — Progress Notes (Signed)
Progress Note  Patient Name: [name] Date of Encounter: 02/09/2019  Primary Cardiologist: Ida Rogue, MD   Subjective   Patient complains of increased shortness of breath and leg swelling today.  No chest pain or palpitations.  Daughter, who is at the bedside, reports that her mom continues to be confused.  Inpatient Medications    Scheduled Meds: . acetaminophen  650 mg Oral Once  . amiodarone  200 mg Oral BID  . collagenase   Topical Daily  . furosemide  80 mg Intravenous BID  . mouth rinse  15 mL Mouth Rinse BID  . multivitamin-lutein  1 capsule Oral Daily  . pantoprazole  40 mg Oral Daily  . sodium chloride flush  3 mL Intravenous Q12H  . vitamin C  500 mg Oral Daily  . warfarin  2 mg Oral ONCE-1800  . Warfarin - Pharmacist Dosing Inpatient   Does not apply q1800   Continuous Infusions: . [START ON 02/10/2019] levofloxacin (LEVAQUIN) IV     PRN Meds: acetaminophen, acetaminophen, calcium carbonate, ipratropium-albuterol, liver oil-zinc oxide, Melatonin, menthol-cetylpyridinium **OR** phenol, metoCLOPramide **OR** metoCLOPramide (REGLAN) injection, nystatin, ondansetron **OR** ondansetron (ZOFRAN) IV, oxyCODONE, traMADol   Vital Signs    Vitals:   02/08/19 1946 02/09/19 0450 02/09/19 0758 02/09/19 0801  BP: 94/68 118/89 99/74   Pulse: (!) 103 (!) 106 (!) 102 (!) 104  Resp: 16 17 19    Temp: 97.9 F (36.6 C) 97.6 F (36.4 C) (!) 97.3 F (36.3 C)   TempSrc: Oral Oral Oral   SpO2: 93% 98% 90% 100%  Height:        Intake/Output Summary (Last 24 hours) at 02/09/2019 1052 Last data filed at 02/09/2019 0935 Gross per 24 hour  Intake 0 ml  Output 250 ml  Net -250 ml   Last 3 Weights 02/09/2019 02/08/2019 02/07/2019  Weight (lbs) (No Data) (No Data) (No Data)  Weight (kg) (No Data) (No Data) (No Data)  Some encounter information is confidential and restricted. Go to Review Flowsheets activity to see all data.      Telemetry    Atrial tachycardia with  ventricular pacing.  Heart rate 105 to 115 bpm.- Personally Reviewed  ECG    No new tracing.  Physical Exam   GEN: No acute distress.   Neck:  JVP to the earlobe seated at 60 degrees with positive HJR. Cardiac:  Tachycardic but regular with 2/6 systolic murmur.  S1 and S2 present. Respiratory:  Diminished breath sounds at both lung bases with faint bibasilar crackles. GI: Soft, nontender, non-distended  MS:  1+ dependent edema in both thighs.  Wound VAC in place at right hip with serosanguineous output. Neuro:   Tremor noted.  Able to move all 4 extremities. Psych: Flat affect.  Labs    High Sensitivity Troponin:  No results for input(s): TROPONINIHS in the last 720 hours.    Cardiac EnzymesNo results for input(s): TROPONINI in the last 168 hours. No results for input(s): TROPIPOC in the last 168 hours.   Chemistry Recent Labs  Lab 02/07/19 0634 02/08/19 0405 02/09/19 0508  NA 137 137 131*  K 3.7 3.8 4.0  CL 97* 97* 91*  CO2 27 27 26   GLUCOSE 107* 97 97  BUN 41* 47* 51*  CREATININE 2.16* 2.28* 2.69*  CALCIUM 9.0 9.0 8.7*  GFRNONAA 21* 19* 16*  GFRAA 24* 23* 18*  ANIONGAP 13 13 14      Hematology Recent Labs  Lab 02/07/19 [id number] 02/08/19 0405 02/09/19 [id number]  WBC 8.0 9.0 8.8  RBC 3.03* 3.10* 2.97*  HGB 8.7* 8.7* 8.4*  HCT 29.9* 29.9* 28.7*  MCV 98.7 96.5 96.6  MCH 28.7 28.1 28.3  MCHC 29.1* 29.1* 29.3*  RDW 19.0* 18.8* 18.4*  PLT 254 299 273    BNP Recent Labs  Lab 02/05/19 0556  BNP 1,492.0*     DDimer No results for input(s): DDIMER in the last 168 hours.   Radiology    Dg Chest Port 1 View  Result Date: 02/08/2019 CLINICAL DATA:  Short of breath. EXAM: PORTABLE CHEST 1 VIEW COMPARISON:  01/19/2019 and earlier exams. FINDINGS: Bilateral interstitial and hazy airspace opacities appear increased compared to the prior study, although this apparent change may be due to the lower lung volumes on the current study. Possible small effusions.  No evidence  of a pneumothorax. There stable changes from previous cardiac surgery and valve replacement. Left anterior chest wall biventricular cardioverter-defibrillator is stable. IMPRESSION: 1. Bilateral interstitial and hazy airspace opacities. Lung appearance is similar to the most recent prior exam allowing for lower lung volumes on the current exam. Suspect small effusions. Etiology of the lung findings is likely due to congestive heart failure. Electronically Signed   By: Lajean Manes M.D.   On: 02/08/2019 19:41    Cardiac Studies   TTE 01/06/2019 [facility]): LVEF ~45%.  Mild RV dysfunction.  Trivial PR and moderate TR.  Moderate aortic stenosis.  Mechanical mitral valve present.  Pulmonary hypertension (RVSP 74 mmHg).  TTE 11/05/2018 1. The left ventricle has severely reduced systolic function, with an ejection fraction of 25-30%. The cavity size was mildly dilated. Left ventricular diastolic Doppler parameters are consistent with impaired relaxation. Left ventrical global  hypokinesis without regional wall motion abnormalities. 2. The right ventricle has mildly reduced systolic function. The cavity was moderately enlarged. There is no increase in right ventricular wall thickness. Right ventricular systolic pressure is moderately elevated with an estimated pressure of 53 mmHg. 3. Right atrial size was moderately dilated. 4. Left atrial size was moderately dilated. 5. The aortic valve was not well visualized. Moderate calcification of the aortic valve. Aortic valve regurgitation was not assessed by color flow Doppler. Unable to estimate adegree of aortic valve stenosis given severely reduced systolic function. 6. Mechanical mitral valve. Mean gradient 5 mm Hg. 7. Tricuspid valve regurgitation is moderate. 8. Left pleural effusion noted, 6.5 cm 9. Rhythm is atrial tachycardia, rate 105 bpm  Patient Profile     84 y.o. female with a hx of atrial fibrillation, chronic combined systolic and  diastolic congestive heart failure, nonischemic cardiomyopathy status post CRT-P(11/2017), left bundle branch block, hypertension, chronic lower extremity edema, mitral valve disease status post mechanical mitral valve replacement on chronic Coumadin, s/p spontaneous left calf hematoma requiring debridement, stage III chronic kidney disease,11/2018 mechanical fall with subsequent R hip fracture and later R hip surgery ([facility]),and seen for management and optimization ofcardiac medications in the setting of the above complex medical and cardiac conditions and s/pright-sided intertrochanteric femur fracture and repair at [facility] [place].  Assessment & Plan    Paroxysmal atrial fibrillation and atrial tachycardia: Telemetry shows persistent tachycardia with heart rate around 110 bpm consistent with slow atrial tachycardia.  Cardioversion has been discussed numerous times with the patient's family, though in the setting of volume overload and delirium, this has continued to be deferred.  Continue anticoagulation with warfarin (target INR 2-3 given history of mechanical mitral valve and spontaneous lower extremity hematomas).  Continue amiodarone 200 mg twice daily.  Continue to defer beta-blockade in the setting of soft blood pressure and family's reluctance to rechallenge the patient with this medication.  Cardioversion can be considered at some point, though I would favor optimization of volume status and improvement in mental status.  While cardioversion would likely lower the patient's heart rate, I do not believe that it would significantly alter her overall trajectory at this time, given relatively low rate of atrial tachycardia.  It should be noted that INR was subtherapeutic for 2 days in late July, though I agree with Dr. Fletcher Anon that I would defer TEE should cardioversion be necessary.  Acute on chronic systolic and diastolic heart failure: Patient continues to appear volume overloaded.  Diuresis has been  de-escalated or held over the last few days due to rising creatinine.  Given that the patient now complains of shortness of breath with evidence of volume overload on exam and last night's chest radiograph, I feel that we need to be more aggressive with fluid removal.  Initiate furosemide 80 mg IV twice daily.  If patient does not make significant urine with this, consider transitioning to a furosemide infusion.  Would favor addition of low-dose beta-blocker, though patient's son has been against this due to possible side effects in the past.  We will defer retrial of a beta-blocker at this time.  Soft blood pressure and worsening renal function preclude addition of ACE inhibitor/ARB/aldosterone antagonist.  Mechanical mitral valve: INR therapeutic (goal 2-3).  Continue warfarin.  Anemia: Hemoglobin holding stable.  Continue to monitor regularly in the setting of thigh hematoma with wound VAC in place and continued serosanguineous output.  Target hemoglobin greater than 7.    For questions or updates, please contact [place] Please consult [email or web address] for contact info under [facility].     Signed, Nelva Bush, MD  02/09/2019, 10:52 AM

## 2019-02-10 DIAGNOSIS — Z950 Presence of cardiac pacemaker: Secondary | ICD-10-CM

## 2019-02-10 DIAGNOSIS — Z91048 Other nonmedicinal substance allergy status: Secondary | ICD-10-CM

## 2019-02-10 DIAGNOSIS — D649 Anemia, unspecified: Secondary | ICD-10-CM

## 2019-02-10 DIAGNOSIS — L89159 Pressure ulcer of sacral region, unspecified stage: Secondary | ICD-10-CM

## 2019-02-10 DIAGNOSIS — G9341 Metabolic encephalopathy: Secondary | ICD-10-CM

## 2019-02-10 DIAGNOSIS — R0600 Dyspnea, unspecified: Secondary | ICD-10-CM

## 2019-02-10 DIAGNOSIS — Z960 Presence of urogenital implants: Secondary | ICD-10-CM

## 2019-02-10 DIAGNOSIS — Z978 Presence of other specified devices: Secondary | ICD-10-CM

## 2019-02-10 DIAGNOSIS — Z9981 Dependence on supplemental oxygen: Secondary | ICD-10-CM

## 2019-02-10 DIAGNOSIS — Z888 Allergy status to other drugs, medicaments and biological substances status: Secondary | ICD-10-CM

## 2019-02-10 DIAGNOSIS — Z87891 Personal history of nicotine dependence: Secondary | ICD-10-CM

## 2019-02-10 DIAGNOSIS — R0902 Hypoxemia: Secondary | ICD-10-CM

## 2019-02-10 DIAGNOSIS — Z880 Allergy status to penicillin: Secondary | ICD-10-CM

## 2019-02-10 DIAGNOSIS — Z7901 Long term (current) use of anticoagulants: Secondary | ICD-10-CM

## 2019-02-10 DIAGNOSIS — N189 Chronic kidney disease, unspecified: Secondary | ICD-10-CM

## 2019-02-10 DIAGNOSIS — I5020 Unspecified systolic (congestive) heart failure: Secondary | ICD-10-CM

## 2019-02-10 DIAGNOSIS — Z881 Allergy status to other antibiotic agents status: Secondary | ICD-10-CM

## 2019-02-10 DIAGNOSIS — Z952 Presence of prosthetic heart valve: Secondary | ICD-10-CM

## 2019-02-10 DIAGNOSIS — N179 Acute kidney failure, unspecified: Secondary | ICD-10-CM

## 2019-02-10 DIAGNOSIS — M80051D Age-related osteoporosis with current pathological fracture, right femur, subsequent encounter for fracture with routine healing: Secondary | ICD-10-CM

## 2019-02-10 LAB — BASIC METABOLIC PANEL
Anion gap: 16 — ABNORMAL HIGH (ref 5–15)
BUN: 58 mg/dL — ABNORMAL HIGH (ref 8–23)
CO2: 25 mmol/L (ref 22–32)
Calcium: 8.6 mg/dL — ABNORMAL LOW (ref 8.9–10.3)
Chloride: 94 mmol/L — ABNORMAL LOW (ref 98–111)
Creatinine, Ser: 2.84 mg/dL — ABNORMAL HIGH (ref 0.44–1.00)
GFR calc Af Amer: 17 mL/min — ABNORMAL LOW (ref >60)
GFR calc non Af Amer: 15 mL/min — ABNORMAL LOW (ref >60)
Glucose, Bld: 88 mg/dL (ref 70–99)
Potassium: 4.1 mmol/L (ref 3.5–5.1)
Sodium: 135 mmol/L (ref 135–145)

## 2019-02-10 LAB — CBC
HCT: 32.6 % — ABNORMAL LOW (ref 36.0–46.0)
Hemoglobin: 9.8 g/dL — ABNORMAL LOW (ref 12.0–15.0)
MCH: 28.6 pg (ref 26.0–34.0)
MCHC: 30.1 g/dL (ref 30.0–36.0)
MCV: 95 fL (ref 80.0–100.0)
Platelets: 270 10*3/uL (ref 150–400)
RBC: 3.43 MIL/uL — ABNORMAL LOW (ref 3.87–5.11)
RDW: 18.6 % — ABNORMAL HIGH (ref 11.5–15.5)
WBC: 8.8 10*3/uL (ref 4.0–10.5)
nRBC: 0 % (ref 0.0–0.2)

## 2019-02-10 LAB — PROTIME-INR
INR: 3.2 — ABNORMAL HIGH (ref 0.8–1.2)
Prothrombin Time: 31.9 seconds — ABNORMAL HIGH (ref 11.4–15.2)

## 2019-02-10 LAB — MAGNESIUM: Magnesium: 2.6 mg/dL — ABNORMAL HIGH (ref 1.7–2.4)

## 2019-02-10 MED ORDER — SODIUM CHLORIDE 0.9 % IV SOLN
250.0000 mL | INTRAVENOUS | Status: DC
  Administered 2019-02-11: 08:00:00 via INTRAVENOUS

## 2019-02-10 MED ORDER — WARFARIN SODIUM 1 MG PO TABS
1.0000 mg | ORAL_TABLET | Freq: Once | ORAL | Status: AC
  Administered 2019-02-10: 1 mg via ORAL
  Filled 2019-02-10: qty 1

## 2019-02-10 MED ORDER — RISAQUAD PO CAPS
1.0000 | ORAL_CAPSULE | Freq: Every day | ORAL | Status: DC
  Administered 2019-02-11 – 2019-02-13 (×3): 1 via ORAL
  Filled 2019-02-10 (×4): qty 1

## 2019-02-10 MED ORDER — SODIUM CHLORIDE 0.9% FLUSH
3.0000 mL | Freq: Two times a day (BID) | INTRAVENOUS | Status: DC
  Administered 2019-02-12 – 2019-02-14 (×6): 3 mL via INTRAVENOUS

## 2019-02-10 MED ORDER — SODIUM CHLORIDE 0.9% FLUSH: 3.0000 mL | INTRAVENOUS | Status: DC | PRN

## 2019-02-10 NOTE — Progress Notes (Signed)
Ms. Oquendo has been drowsy all day. Was asleep this am and per daughter's request patient was not awakened for breakfast or turn/position. Pt has received no sedation in greater than 24 hours. It is recommended by Dr. Posey Pronto and Dr. Rockey Situ that pt have ABG to assess pt's CO2 level, to rule this out as a cause of sleepiness. However, daughter declines; states pt will not consent to Bipap in any event.   Pt was awakened for assessment around 1:00 pm. Pt complained of excruciating pain at coccyx area. Given pain medication, Tylenol and Tramadol for coccyx/sacral pain.and she also took other po meds. Pt begged not to be moved. Dressing was changed at 6:30 this am by night shift nurse. Will continue to monitor for now.   Dr. Rockey Situ and Dr. Posey Pronto at bedside for discussion with daughter regarding plan.  Dr. Rockey Situ discussed DCCV for pt's afib, possibly tomorrow. Patient has consented verbally per daughter; per Dr. Rockey Situ, family should be here early tomorrow am to speak with anesthesia.

## 2019-02-10 NOTE — Progress Notes (Signed)
[facility]  ROUNDING NOTE   Subjective:  Patient appears to be a worsening renal function. Creatinine up to 2.8 with an EGFR 15. Patient only made 400 cc of urine despite Lasix bolus and infusion. Had another long discussion with the patient's daughter regarding the potential for renal placement therapy.    Objective:  Vital signs in last 24 hours:  Temp:  [97.4 F (36.3 C)-98.2 F (36.8 C)] 97.6 F (36.4 C) (08/12 0741) Pulse Rate:  [98-125] 99 (08/12 1215) Resp:  [18-24] 24 (08/12 1200) BP: (95-123)/(62-90) 95/68 (08/12 1215) SpO2:  [98 %-100 %] 99 % (08/12 1215) Weight:  [52.2 kg] 52.2 kg (08/12 0500)  Weight change:  Filed Weights   02/10/19 0500  Weight: 52.2 kg    Intake/Output: I/O last 3 completed shifts: In: 140.3 [I.V.:140.3] Out: 650 [Urine:650]   Intake/Output this shift:  Total I/O In: 60 [P.O.:60] Out: 300 [Urine:300]  Physical Exam: General: Chronically ill-appearing  Head: Normocephalic, atraumatic. Moist oral mucosal membranes  Eyes: Anicteric  Neck: Supple, trachea midline  Lungs:  Bilateral crackles at bases, normal effort  Heart: Regular rate and rhythm, paced  Abdomen:  Soft, nontender  Extremities: ++ peripheral edema, legs wrapped  Neurologic: Lethargic, not following commands  Skin: No lesions  Access: none    Basic Metabolic Panel: Recent Labs  Lab 02/06/19 0616 02/07/19 0634 02/08/19 0405 02/09/19 0508 02/10/19 0613  NA 136 137 137 131* 135  K 4.0 3.7 3.8 4.0 4.1  CL 95* 97* 97* 91* 94*  CO2 29 27 27 26 25   GLUCOSE 95 107* 97 97 88  BUN 36* 41* 47* 51* 58*  CREATININE 2.04* 2.16* 2.28* 2.69* 2.84*  CALCIUM 8.9 9.0 9.0 8.7* 8.6*  MG  --   --   --  2.4 2.6*    Liver Function Tests: No results for input(s): AST, ALT, ALKPHOS, BILITOT, PROT, ALBUMIN in the last 168 hours. No results for input(s): LIPASE, AMYLASE in the last 168 hours. No results for input(s): AMMONIA in the last 168 hours.  CBC: Recent  Labs  Lab 02/06/19 0616 02/07/19 0634 02/08/19 0405 02/09/19 0508 02/10/19 0613  WBC 8.6 8.0 9.0 8.8 8.8  NEUTROABS  --   --  7.7  --   --   HGB 8.5* 8.7* 8.7* 8.4* 9.8*  HCT 28.6* 29.9* 29.9* 28.7* 32.6*  MCV 97.9 98.7 96.5 96.6 95.0  PLT 264 254 299 273 270    Cardiac Enzymes: No results for input(s): CKTOTAL, CKMB, CKMBINDEX, TROPONINI in the last 168 hours.  BNP: Invalid input(s): POCBNP  CBG: No results for input(s): GLUCAP in the last 168 hours.  Microbiology: Results for orders placed or performed during the hospital encounter of 12/30/2018  Novel Coronavirus,NAA,(SEND-OUT TO REF LAB - TAT 24-48 hrs); Hosp Order     Status: None   Collection Time: 01/19/19 12:10 AM   Specimen: Nasopharyngeal Swab; Respiratory  Result Value Ref Range Status   SARS-CoV-2, NAA NOT DETECTED NOT DETECTED Final    Comment: (NOTE) This test was developed and its performance characteristics determined by [employer]. This test has not been FDA cleared or approved. This test has been authorized by FDA under an Emergency Use Authorization (EUA). This test is only authorized for the duration of time the declaration that circumstances exist justifying the authorization of the emergency use of in vitro diagnostic tests for detection of SARS-CoV-2 virus and/or diagnosis of COVID-19 infection under section 564(b)(1) of the Act, 21 U.S.C. 287GOT-1(X)(7), unless  the authorization is terminated or revoked sooner. When diagnostic testing is negative, the possibility of a false negative result should be considered in the context of a patient's recent exposures and the presence of clinical signs and symptoms consistent with COVID-19. An individual without symptoms of COVID-19 and who is not shedding SARS-CoV-2 virus would expect to have a negative (not detected) result in this assay. Performed  At: [facility] [address] [place], [place] [id number] Rush Farmer MD  [id number]    [place]  Final    Comment: Performed at [facility], [address] [place], [place] [address]  Urine Culture     Status: Abnormal   Collection Time: 01/31/19  2:18 PM   Specimen: Urine, Random  Result Value Ref Range Status   Specimen Description   Final    URINE, RANDOM Performed at [facility], [address] [place], [place] [address]    Special Requests   Final    NONE Performed at [facility], [place], [place] [address]    Culture >=100,000 COLONIES/mL ENTEROCOCCUS FAECALIS (A)  Final   Report Status 02/02/2019 FINAL  Final   Organism ID, Bacteria ENTEROCOCCUS FAECALIS (A)  Final      Susceptibility   Enterococcus faecalis - MIC*    AMPICILLIN <=2 SENSITIVE Sensitive     LEVOFLOXACIN 0.5 SENSITIVE Sensitive     NITROFURANTOIN <=16 SENSITIVE Sensitive     VANCOMYCIN 1 SENSITIVE Sensitive     * >=100,000 COLONIES/mL ENTEROCOCCUS FAECALIS    Coagulation Studies: Recent Labs    02/08/19 0405 02/09/19 0508 02/10/19 0613  LABPROT 22.9* 28.8* 31.9*  INR 2.1* 2.8* 3.2*    Urinalysis: Recent Labs    02/08/19 1444  COLORURINE AMBER*  LABSPEC 1.017  PHURINE 5.0  GLUCOSEU NEGATIVE  HGBUR SMALL*  BILIRUBINUR NEGATIVE  KETONESUR NEGATIVE  PROTEINUR 100*  NITRITE NEGATIVE  LEUKOCYTESUR LARGE*      Imaging: Dg Chest Port 1 View  Result Date: 02/08/2019 CLINICAL DATA:  Short of breath. EXAM: PORTABLE CHEST 1 VIEW COMPARISON:  01/19/2019 and earlier exams. FINDINGS: Bilateral interstitial and hazy airspace opacities appear increased compared to the prior study, although this apparent change may be due to the lower lung volumes on the current study. Possible small effusions.  No evidence of a pneumothorax. There stable changes from previous cardiac surgery and valve replacement. Left anterior chest wall biventricular cardioverter-defibrillator is stable. IMPRESSION: 1. Bilateral  interstitial and hazy airspace opacities. Lung appearance is similar to the most recent prior exam allowing for lower lung volumes on the current exam. Suspect small effusions. Etiology of the lung findings is likely due to congestive heart failure. Electronically Signed   By: Lajean Manes M.D.   On: 02/08/2019 19:41     Medications:   . furosemide (LASIX) infusion 10 mg/hr (02/10/19 1349)   . acetaminophen  650 mg Oral Once  . amiodarone  200 mg Oral BID  . collagenase   Topical Daily  . fosfomycin  3 g Oral Once  . mouth rinse  15 mL Mouth Rinse BID  . multivitamin-lutein  1 capsule Oral Daily  . pantoprazole  40 mg Oral Daily  . sodium chloride flush  3 mL Intravenous Q12H  . vitamin C  500 mg Oral Daily  . warfarin  1 mg Oral ONCE-1800  . Warfarin - Pharmacist Dosing Inpatient   Does not apply q1800   acetaminophen, acetaminophen, calcium carbonate, ipratropium-albuterol, liver oil-zinc oxide,  Melatonin, menthol-cetylpyridinium **OR** phenol, metoCLOPramide **OR** metoCLOPramide (REGLAN) injection, nystatin, ondansetron **OR** ondansetron (ZOFRAN) IV, oxyCODONE, traMADol  Assessment/ Plan:  [name] is a 84 y.o. white female with congestive heart failure EF 25%, hypertension, atrial fibrillation, lymphedema, mitral valve replacement (mechanical valve) requiring chronic anticoagulation, COPD, osteoporosis, history of multiple lumbar compression fractures requiring kyphoplasty, history of left leg hematoma with evacuation and porcine allograft, was admitted on 12/25/2018 with right hip fracture.   1.  Acute kidney injury, chronic kidney disease stage III.  Baseline 1.37/GFR 36 from December 15, 2018 Acute renal failure may be secondary to vancomycin usage. Previously required renal replacement therapy.  -Patient has worsening renal status.  Creatinine up to 2.8 with EGFR 15 and urine output only 400 cc despite Lasix infusion as well as bolus.  I had another long discussion  regarding renal placement therapy with the patient's daughter.  They are not sure as to whether they would proceed with renal replacement therapy.  Renal placement therapy would likely proved to be quite difficult given the patient's underlying comorbidities.  2.  Anemia of chronic kidney disease.  Hemoglobin 9.8.  No indication for Epogen immediately.  3.  Chronic systolic congestive heart failure with EF 25%.  Patient with volume overload but has not made very much urine despite Lasix infusion and bolus.  Only additional option is renal replacement therapy however family unclear regarding this at the moment.  4.  Pulmonary hypertension with respiratory failure: Patient currently on nasal cannula.  5.  Mechanical heart valve requiring chronic anticoagulation. History of endocarditis.    LOS: 23 Glennis Borger 8/12/20203:33 PM

## 2019-02-10 NOTE — Progress Notes (Signed)
Progress Note  Patient Name: [name] Date of Encounter: 02/10/2019  Primary Cardiologist: Ida Rogue, MD   Subjective   Very lethargic today Arousable but goes back to sleep Daughter reports she has been awake for 2 days, some delirium, feels she is exhausted Daughter also very concerned about atrial tachycardia and urinary tract infection  Inpatient Medications    Scheduled Meds: . acetaminophen  650 mg Oral Once  . [START ON 02/16/2019] acidophilus  1 capsule Oral Daily  . amiodarone  200 mg Oral BID  . collagenase   Topical Daily  . mouth rinse  15 mL Mouth Rinse BID  . multivitamin-lutein  1 capsule Oral Daily  . pantoprazole  40 mg Oral Daily  . sodium chloride flush  3 mL Intravenous Q12H  . vitamin C  500 mg Oral Daily  . warfarin  1 mg Oral ONCE-1800  . Warfarin - Pharmacist Dosing Inpatient   Does not apply q1800   Continuous Infusions: . furosemide (LASIX) infusion 10 mg/hr (02/10/19 1349)   PRN Meds: acetaminophen, acetaminophen, calcium carbonate, ipratropium-albuterol, liver oil-zinc oxide, Melatonin, menthol-cetylpyridinium **OR** phenol, metoCLOPramide **OR** metoCLOPramide (REGLAN) injection, nystatin, ondansetron **OR** ondansetron (ZOFRAN) IV, oxyCODONE, traMADol   Vital Signs    Vitals:   02/10/19 0741 02/10/19 1200 02/10/19 1215 02/10/19 1653  BP: 95/72 95/62 95/68  90/63  Pulse: 100 98 99 96  Resp: 18 (!) 24    Temp: 97.6 F (36.4 C)     TempSrc: Oral     SpO2: 100%  99% 100%  Weight:      Height:        Intake/Output Summary (Last 24 hours) at 02/10/2019 1755 Last data filed at 02/10/2019 1430 Gross per 24 hour  Intake 200.33 ml  Output 600 ml  Net -399.67 ml   Last 3 Weights 02/10/2019 02/09/2019 02/08/2019  Weight (lbs) 115 lb (No Data) (No Data)  Weight (kg) 52.164 kg (No Data) (No Data)  Some encounter information is confidential and restricted. Go to Review Flowsheets activity to see all data.      Telemetry     Atrial tachycardia with ventricular pacing.  Heart rate 100 to 110 bpm.- Personally Reviewed  ECG    No new tracing.  Physical Exam   Constitutional: No distress, lethargic but arousable HENT:  Head: Grossly normal Eyes:  no discharge. No scleral icterus.  Neck: Unable to estimate JVD, no carotid bruits  Cardiovascular: Regular rate and rhythm, no murmurs appreciated Pulmonary/Chest: Shallow breaths, no wheezes  Dullness at the bases Abdominal: Soft.  no distension.  no tenderness.  Musculoskeletal: Normal range of motion Neurological: Unable to evaluate, muscle atrophy noted Skin: Skin warm and dry Psychiatric: Sleeping, unable to assess   Labs    High Sensitivity Troponin:  No results for input(s): TROPONINIHS in the last 720 hours.    Cardiac EnzymesNo results for input(s): TROPONINI in the last 168 hours. No results for input(s): TROPIPOC in the last 168 hours.   Chemistry Recent Labs  Lab 02/08/19 0405 02/09/19 0508 02/10/19 0613  NA 137 131* 135  K 3.8 4.0 4.1  CL 97* 91* 94*  CO2 27 26 25   GLUCOSE 97 97 88  BUN 47* 51* 58*  CREATININE 2.28* 2.69* 2.84*  CALCIUM 9.0 8.7* 8.6*  GFRNONAA 19* 16* 15*  GFRAA 23* 18* 17*  ANIONGAP 13 14 16*     Hematology Recent Labs  Lab 02/08/19 0405 02/09/19 0508 02/10/19 0613  WBC 9.0 8.8 8.8  RBC 3.10* 2.97* 3.43*  HGB 8.7* 8.4* 9.8*  HCT 29.9* 28.7* 32.6*  MCV 96.5 96.6 95.0  MCH 28.1 28.3 28.6  MCHC 29.1* 29.3* 30.1  RDW 18.8* 18.4* 18.6*  PLT 299 273 270    BNP Recent Labs  Lab 02/05/19 0556  BNP 1,492.0*     DDimer No results for input(s): DDIMER in the last 168 hours.   Radiology    Dg Chest Port 1 View  Result Date: 02/08/2019 CLINICAL DATA:  Short of breath. EXAM: PORTABLE CHEST 1 VIEW COMPARISON:  01/19/2019 and earlier exams. FINDINGS: Bilateral interstitial and hazy airspace opacities appear increased compared to the prior study, although this apparent change may be due to the lower lung  volumes on the current study. Possible small effusions.  No evidence of a pneumothorax. There stable changes from previous cardiac surgery and valve replacement. Left anterior chest wall biventricular cardioverter-defibrillator is stable. IMPRESSION: 1. Bilateral interstitial and hazy airspace opacities. Lung appearance is similar to the most recent prior exam allowing for lower lung volumes on the current exam. Suspect small effusions. Etiology of the lung findings is likely due to congestive heart failure. Electronically Signed   By: Lajean Manes M.D.   On: 02/08/2019 19:41    Cardiac Studies   TTE 01/06/2019 [facility]): LVEF ~45%.  Mild RV dysfunction.  Trivial PR and moderate TR.  Moderate aortic stenosis.  Mechanical mitral valve present.  Pulmonary hypertension (RVSP 74 mmHg).  TTE 11/05/2018 1. The left ventricle has severely reduced systolic function, with an ejection fraction of 25-30%. The cavity size was mildly dilated. Left ventricular diastolic Doppler parameters are consistent with impaired relaxation. Left ventrical global  hypokinesis without regional wall motion abnormalities. 2. The right ventricle has mildly reduced systolic function. The cavity was moderately enlarged. There is no increase in right ventricular wall thickness. Right ventricular systolic pressure is moderately elevated with an estimated pressure of 53 mmHg. 3. Right atrial size was moderately dilated. 4. Left atrial size was moderately dilated. 5. The aortic valve was not well visualized. Moderate calcification of the aortic valve. Aortic valve regurgitation was not assessed by color flow Doppler. Unable to estimate adegree of aortic valve stenosis given severely reduced systolic function. 6. Mechanical mitral valve. Mean gradient 5 mm Hg. 7. Tricuspid valve regurgitation is moderate. 8. Left pleural effusion noted, 6.5 cm 9. Rhythm is atrial tachycardia, rate 105 bpm  Patient Profile     84 y.o. female  with a hx of atrial fibrillation, chronic combined systolic and diastolic congestive heart failure, nonischemic cardiomyopathy status post CRT-P(11/2017), left bundle branch block, hypertension, chronic lower extremity edema, mitral valve disease status post mechanical mitral valve replacement on chronic Coumadin, s/p spontaneous left calf hematoma requiring debridement, stage III chronic kidney disease,11/2018 mechanical fall with subsequent R hip fracture and later R hip surgery ([facility]),and seen for management and optimization ofcardiac medications in the setting of the above complex medical and cardiac conditions and s/pright-sided intertrochanteric femur fracture and repair at [facility].  Assessment & Plan     atrial tachycardia: She had cardioversion in the past for persistent atrial tachycardia at which time she had escalating diuretic regiment with worsening renal failure -After restoring normal sinus rhythm had better urine output, improved renal function -Long discussion with daughter concerning treatment options Family very interested in pursuing cardioversion to restore normal sinus rhythm in effort to facilitate diuresis, improve her shortness of breath, renal function -After discussion the risk and benefit of the procedure daughter is  willing to proceed, daughter has discussed this with the patient and she is willing to proceed -This will be scheduled for tomorrow morning Prior procedure was successful approximately 1 month ago Family is declining beta-blockers Will continue amiodarone at current doses She is not a good candidate for transesophageal echo  Atrial fibrillation, paroxysmal No recent episodes, On warfarin  Acute on chronic systolic and diastolic heart failure: Diuresis challenging in the setting of cardiomyopathy, atrial tachycardia -Would continue Lasix infusion, plan to restore normal sinus rhythm  Mechanical mitral valve: INR therapeutic (goal 2-3). On warfarin  Discussed risk of bleeding, oozing from hip with family at the bedside  Anemia: Hemoglobin holding stable.    thigh hematoma with wound VAC in place Challenging recovery from her hip   Total encounter time more than 35 minutes  Greater than 50% was spent in counseling and coordination of care with the patient   For questions or updates, please contact [place] Please consult [email or web address] for contact info under [facility].     Signed, Ida Rogue, MD  02/10/2019, 5:55 PM

## 2019-02-10 NOTE — Consult Note (Signed)
[facility] for Warfarin Dosing  Indication: Mitral Valve Replacement, A. Fib    Allergies  Allergen Reactions  . Penicillins Rash    ** daughter reports facial swelling**   Has patient had a PCN reaction causing immediate rash, facial/tongue/throat swelling, SOB or lightheadedness with hypotension: YES Has patient had a PCN reaction causing severe rash involving mucus membranes or skin necrosis: NO Has patient had a PCN reaction that required hospitalization NO Has patient had a PCN reaction occurring within the last 10 years: NO If all of the above answers are "NO", then may proceed with Cephalosporin use.  . Ace Inhibitors     Cough  . Doxycycline Nausea Only  . Hydrocodone Itching and Nausea Only  . Mercury     Other reaction(s): Unknown  . Silver Dermatitis    Severe itching  . Cephalexin Rash  . Clindamycin/Lincomycin Rash  . Nickel Rash    Patient Measurements: Height: 4\' 11"  (149.9 cm) Weight: 115 lb (52.2 kg) IBW/kg (Calculated) : 43.2   Vital Signs: Temp: 97.4 F (36.3 C) (08/12 0523) Temp Source: Oral (08/11 1954) BP: 106/77 (08/12 0523) Pulse Rate: 104 (08/12 0523)  Labs: Recent Labs    02/08/19 0405 02/09/19 0508 02/10/19 0613  HGB 8.7* 8.4* 9.8*  HCT 29.9* 28.7* 32.6*  PLT 299 273 270  LABPROT 22.9* 28.8* 31.9*  INR 2.1* 2.8* 3.2*  CREATININE 2.28* 2.69* 2.84*    Estimated Creatinine Clearance: 11.5 mL/min (A) (by C-G formula based on SCr of 2.84 mg/dL (H)).   Medical History: Past Medical History:  Diagnosis Date  . Arthritis    "back, hands, knees" (04/10/2016)  . Chronic back pain   . 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 EF 25% by echo in 08/2015 b. RHC in 08/2015 showed normal filling pressures; c. 10/2018 Echo (in setting of atrial tachycardia): EF 25-30%, impaired relaxation. Glob HK. RVSP 37mmHg. Mod dil RA/LA. Mech MV w/ nl fxn (mean grad  53mmHg). Mod TR.   Marland Kitchen Chronic kidney disease   . Compression fracture    "several; all in my back" (04/10/2016)  . GERD (gastroesophageal reflux disease)   . Hypertension   . Lymphedema   . Pacemaker 05/2017  . PAF (paroxysmal atrial fibrillation) ([place])    a. On amio & Coumadin - CHA2DS2VASc = 4.  . S/P MVR (mitral valve replacement)    a. MVR 1994 b. redo MVR in 08/2014 - on Coumadin  . Shortness of breath dyspnea    Assessment: Pharmacy consulted for warfarin dosing and monitoring for 84 yo female with PMH of mitral valve replacement and A. Fib. Patient transferred to [facility] 7/20 PM from [facility]. Patient's INR is sensitive to changes. We have been giving her a lower dose from her home dose. Patient may need to be discharged on 2 mg daily.   Home dose: warfarin 2.5 mg daily.   DDI: on amiodarone (increase INR); levofloxacin 8/10 started (may significantly increase INR)  Spoke to MD on 7/22 regarding low Hgb- okay with continuing warfarin at lower dose.  Today's INR is supratherapeutic. However, the INR significantly increased likely due to recent boosted doses, a dose of levofloxacin on 8/10, and CHF (fluid changes). Will need to give another reduced dose today.   DATE INR DOSE 7/23 3.0 1 mg - did not receive.  7/24     2.7       2 mg  7/25 2.4  2 mg 7/26 2.2 2.5 mg 7/27     1.9       4 mg 7/28     1.9       3 mg  7/29 2.3 2.5 mg 7/30     2.9       2 mg 7/31     3.1       2 mg  8/1       4.0       Hold  8/2       3.3       1 mg 8/3 2.7 1 mg 8/4 2.5 1.5 mg - missed this dose 8/5 2.2 1.5 mg  8/6 2.2 2 mg  8/7 2.1 2.5 mg 8/8 2.1 2.5 mg 8/9 2.1 3 mg 8/10 2.1 2.5 mg  8/11 2.8  2 mg  8/12 3.2  Goal of Therapy:  INR 2.5-3.5 (ideally want INR between 2.5 to 3 per previous notes)- --Addendum 02/08/2019 @1350 . Discussed with Marrianne Mood, PA-C about INR goal of 2-3. Given h/o hematoma will use an INR goal of 2-3; per Dr.Arida preference for 2.5-3.   Monitor platelets by  anticoagulation protocol: Yes   Plan:  INR level is supratherapeutic. Will give warfarin 1 mg tonight x 1 dose. Hgb 9.8  Plt 270 Daily INR ordered. Pharmacy will continue to follow and order warfarin based on INRs  Rowland Lathe, PharmD 02/10/2019 7:38 AM

## 2019-02-10 NOTE — Progress Notes (Signed)
Arrived for abg. Daughter refused. States will have RN to call if she changes her mind.

## 2019-02-10 NOTE — Progress Notes (Addendum)
Nutrition Follow-up  DOCUMENTATION CODES:   Severe malnutrition in context of chronic illness  INTERVENTION:  Continue Magic cup TID with meals, each supplement provides 290 kcal and 9 grams of protein.   Continue Ocuvite daily for wound healing (provides zinc, vitamin A, vitamin C, Vitamin E, copper, and selenium).  Continue vitamin C 500 mg po daily.  Plan is for possible placement of small-bore feeding tube for medication administration. If this occurs, patient would benefit from initiation of enteral nutrition as she is not able to meet her needs from PO intake. She has had poor PO intake entire admission. At the same time, it is important to consider that enteral nutrition would be a short-term bridge and will not make a significant impact on patient's severe chronic malnutrition.  If tube feeds are initiated recommend initiating Osmolite 1.5 Cal at 20 mL/hr and advancing by 10 ml/hr every 8 hours to goal rate of 40 mL/hr. Also provide Pro-Stat 30 mL once daily per tube. Provides 1540 kcal, 75 grams of protein, 730 mL H2O daily.  If tube feeds are initiated provide minimum free water flush of 20-30 mL Q4hrs to maintain tube patency.  Patient will be at risk for refeeding syndrome if tube feeds are initiated. Recommend monitoring potassium, magnesium, and phosphorus daily and replacing as needed.  NUTRITION DIAGNOSIS:   Severe Malnutrition related to chronic illness(COPD, CKD IV) as evidenced by severe fat depletion, severe muscle depletion.  Ongoing.  GOAL:   Patient will meet greater than or equal to 90% of their needs  Not progressing.  MONITOR:   PO intake, Supplement acceptance, Labs, Weight trends, TF tolerance, Skin, I & O's  REASON FOR ASSESSMENT:   Malnutrition Screening Tool, Consult Assessment of nutrition requirement/status, Hip fracture protocol, Wound healing  ASSESSMENT:   84 y.o. female with a known history of right hip repair at [facility] on  01/11/2019 secondary to mechanical fall at home, paroxysmal atrial fibrillation, mitral valve replacement on Coumadin, COPD, CKD stage IV requiring CRRT last admit, hypertension, history of left lower extremity hematoma with evacuation and porcine graft performed on 12/25/2018.  Our service has been following patient during admission. Received another consult today for wound healing and hip fracture protocol. Met with patient and her daughter in room. Patient is lethargic and unable to provide any history. Daughter reports she has been lethargic today and yesterday and has not had much of anything to eat or drink. Daughter initially reports that prior to that patient was eating well. She reports she was getting things from the cafeteria that patient preferred such as eggs with cheese, mashed potatoes, chicken, beef, soup. However, on further investigation to determine the amount patient was taking it is was only bites at a meal. She is drinking Boost Plus brought in from daughter occasionally, but this is not regular enough to contribute significantly to calorie/protein needs. Patient was previously started on an appetite stimulant but it was discontinued the following day per family request.  Medications reviewed and include: amiodarone, Ocuvite daily, pantoprazole, vitamin C 500 mg daily, Lasix at 10 mL/hr.  Labs reviewed: Chloride 94, Creatinine 2.84, Anion gap 16, Magnesium 2.6.  NUTRITION - FOCUSED PHYSICAL EXAM:    Most Recent Value  Orbital Region  Severe depletion  Upper Arm Region  Severe depletion  Thoracic and Lumbar Region  Severe depletion  Buccal Region  Severe depletion  Temple Region  Severe depletion  Clavicle Bone Region  Severe depletion  Clavicle and Acromion Bone Region  Severe depletion  Scapular Bone Region  Severe depletion  Dorsal Hand  Severe depletion  Patellar Region  Unable to assess  Anterior Thigh Region  Unable to assess  Posterior Calf Region  Unable to assess   Edema (RD Assessment)  Unable to assess  Hair  Reviewed  Eyes  Unable to assess  Mouth  Unable to assess  Skin  Reviewed  Nails  Reviewed     Diet Order:   Diet Order            Diet NPO time specified  Diet effective midnight        Diet regular Room service appropriate? Yes; Fluid consistency: Thin  Diet effective now             EDUCATION NEEDS:   No education needs have been identified at this time  Skin:  Skin Assessment: Reviewed RN Assessment(incision R hip, multiple hematomas and wounds on legs and feet)  Last BM:  02/08/2019 per chart  Height:   Ht Readings from Last 1 Encounters:  01/12/2019 4' 11"  (1.499 m)   Weight:   Wt Readings from Last 1 Encounters:  02/10/19 52.2 kg   Ideal Body Weight:  44.5 kg  BMI:  Body mass index is 23.23 kg/m.  Estimated Nutritional Needs:   Kcal:  1400-1600kcal/day  Protein:  70-80g/day  Fluid:  1.1L/day  Willey Blade, MS, RD, LDN Office: [phone] Pager: [phone] After Hours/Weekend Pager: [phone]

## 2019-02-10 NOTE — Progress Notes (Signed)
[facility] [place] NAME: [name]    MR#:  [id number]  DATE OF BIRTH:  [birth year]  SUBJECTIVE:  CHIEF COMPLAINT:  No chief complaint on file.  Patient is very sleepy.  Daughter at bedside.  States that patient has not been acting herself since yesterday.  Patient currently is very drowsy.  Patient seen with Dr. Rockey Situ in the room.  Patient was noted to have abnormal UA.  Her previous urine analysis and urine culture showed enterococcus in the urine.  Patient has penicillin allergy.  She was not able to take nitrofurantoin due to her drowsy state.  Also there was some concern with vancomycin causing kidney issues.     REVIEW OF SYSTEMS:  Review of Systems  Unable to perform ROS: Acuity of condition     DRUG ALLERGIES:   Allergies  Allergen Reactions  . Penicillins Rash    ** daughter reports facial swelling**   Has patient had a PCN reaction causing immediate rash, facial/tongue/throat swelling, SOB or lightheadedness with hypotension: YES Has patient had a PCN reaction causing severe rash involving mucus membranes or skin necrosis: NO Has patient had a PCN reaction that required hospitalization NO Has patient had a PCN reaction occurring within the last 10 years: NO If all of the above answers are "NO", then may proceed with Cephalosporin use.  . Ace Inhibitors     Cough  . Doxycycline Nausea Only  . Hydrocodone Itching and Nausea Only  . Mercury     Other reaction(s): Unknown  . Silver Dermatitis    Severe itching  . Cephalexin Rash  . Clindamycin/Lincomycin Rash  . Nickel Rash   VITALS:  Blood pressure 95/68, pulse 99, temperature 97.6 F (36.4 C), temperature source Oral, resp. rate (!) 24, height 4\' 11"  (1.499 m), weight 52.2 kg, SpO2 99 %. PHYSICAL EXAMINATION:  Physical Exam  General patient chronically ill-appearing currently lethargic HENT:  Nose: No mucosal edema.  Mouth/Throat: No oropharyngeal  exudate or posterior oropharyngeal edema.  Eyes: Pupils are equal, round, and reactive to light. Conjunctivae,  Neck: No JVD present. Carotid bruit is not present. No edema present. No thyroid mass and no thyromegaly present.  Cardiovascular: S1 normal and S2 normal.  Murmur heard.  Systolic murmur is present with a grade of 3/6. tachycardia  Respiratory: No respiratory distress.no wheezing.  No rales  GI: Soft. Bowel sounds are normal. There is no abdominal tenderness.  Musculoskeletal:     Right ankle: She exhibits swelling.     Left ankle: She exhibits swelling.     Comments: Right hip status post surgery with wound VAC and edema  Lymphadenopathy:    She has no cervical adenopathy.  Neurological: Lethargic Skin: Skin is warm. Nails show no clubbing.  Sacral decubitus ulcer now unstageable Psychiatric: Lethargic  Foley catheter in place, skin lesions on leg and sacrum.  LABORATORY PANEL:  Female CBC Recent Labs  Lab 02/10/19 0613  WBC 8.8  HGB 9.8*  HCT 32.6*  PLT 270   ------------------------------------------------------------------------------------------------------------------ Chemistries  Recent Labs  Lab 02/10/19 0613  NA 135  K 4.1  CL 94*  CO2 25  GLUCOSE 88  BUN 58*  CREATININE 2.84*  CALCIUM 8.6*  MG 2.6*   RADIOLOGY:  No results found. ASSESSMENT AND PLAN:   1. Acute encephalopathy could be related to UTI however patient has no evidence of fever WBC count is normal limited options for antibiotics.  Family has requested infectious  disease consult which has been ordered our Dr. Tama High will be seeing the patient.  Family very insistent on initiating antibiotics.  Also due to lethargy I asked for an ABG.    2. Acute on chronic combined systolic and diastolic heart failure.   Continue IV Lasix  3. paroxysmal atrial fibrillation with some atrial tachycardia and history of mitral valve replacement on amiodarone and Coumadin-continue amiodarone  Family does not want patient to be started on beta-blocker    4. Acute blood loss anemia secondary to large right hip hematoma and skin tear right leg.  Also has other hematomas on the legs. Hemoglobin stable. Monitor.  Hemoglobin stable today  5. Hip fracture status post operative repair at [facility].  With hematoma physical therapy recommending rehab.  Family wants patient to go home once clinically more stable and has enough endurance and strength.  Do not want SNF placement.  Continue wound VAC, care per orhtopedics dr.Menz   6. AKI on Chronic kidney disease stage III.  With cardiorenal component and vancomycin, closely monitor with diuresis lasix drip. IV vancomycin previously. Family to decide on CRRT by tomorrow 7. sacral decubiti-unstageable with some tunneling.  Newly developing right heel pressure ulcer pressure pads applied.   Surgery following patient.  wound care.  Encouraged to reposition patient frequently-every 1-2 hours 8. Large right hip hematoma.  Needs to be on Coumadin secondary to metallic mitral valve.  Continue to monitor.  Patient has been followed by orthopedics here .  Wound VAC placed.F/U with ortho regarding wound vac continuity as drainage has decreased. 9. Skin tear right lower extremity, left upper extremity supportive care.  Daily wound dressings and wound care team follow-up.Wound care dressings every alternate day.  Social worker provided [employer].    Patient's daughter wishes to look into option of LTAC on discharge  All the records are reviewed and case discussed with Care Management/Social Worker. Management plans discussed with the patient, family and they are in agreement. Updated daughter present at bedside on treatment plans.  She agrees with plan of care  CODE STATUS: DNR  TOTAL TIME TAKING CARE OF THIS PATIENT: 37 minutes.   More than 50% of the time was spent in counseling/coordination of care: YES  POSSIBLE D/C IN 2 DAYS, DEPENDING ON CLINICAL  CONDITION.   Dustin Flock M.D on 02/10/2019 at 2:56 PM  Between 7am to 6pm - Pager - [phone]  After 6pm go to [email or web address] - Proofreader  [facility]  Office  [phone]  CC: Primary care physician; Rusty Aus, MD  Note: This dictation was prepared with Dragon dictation along with smaller phrase technology. Any transcriptional errors that result from this process are unintentional.

## 2019-02-10 NOTE — Consult Note (Signed)
NAME: [name]  DOB: 1934-11-04  MRN: [id number]  Date/Time: 02/10/2019 2:08 PM  REQUESTING PROVIDER: Dr. Posey Pronto Subjective:  REASON FOR CONSULT: UTI No history available from patient. Chart reviewed thoroughly and spoke to the daughter and son at bedside. ? [name] is a 84 y.o. female with a history of chronic A. fib, systolic CHF with permanent pacemaker placement, mitral valve replacement and currently on anticoagulation,, CKD, osteoporosis, home oxygen for chronic hypoxemia.  Patient has had multiple hospitalizations in the past few months.   11/04/2018 until 11/08/2022 acute on chronic renal failure, CHF bilateral lower extremity edema  11/09/2018 until 11/30/2018 for left lower extremity hematoma after a trauma.  Underwent debridement of the left calf hematoma and had a biological graft placed on 11/11/2018 by Dr. Bary Castilla She had a ACell placement on 11/19/2018     12/04/2018 until 12/15/2018 for congestive heart failure     She was in the hospital between 12/25/2018  until 01/05/2019 for right hip pain following a fall.  Noted to have intertrochanteric right hip fracture.  Her hospital stay was complicated with acute blood loss anemia and worsening respiratory failure and underlying severe protein calorie malnutrition and deconditioning.   she was considered high risk for surgery and hence transferred to [facility].  She underwent surgery On 01/11/2019 at [facility].  Postoperative she experience heart failure and required Lasix infusion which was discontinued on 01/17/2019.  Postoperatively she had a heparin infusion to warfarin bridge for her history of mechanical mitral valve and atrial fibrillation.  She was transferred back to [facility] on 12/30/2018. Her vital status on admission was blood pressure 109 x 54, pulse of 64, temperature of 98.5 and pulse ox of 100%.  She weighed 53 EKGs and her height was 4'11.  The labs on admission was hemoglobin of 8.5, WBC of 9.1 and platelet of 207.   Creatinine was1.63, BUN 72, CO2 of 43, sodium of 136 and potassium of 3.7. On admission her meds included Lasix, amiodarone, Coumadin ,oxygen and a Foley catheter for monitoring output. I am asked to see the patient for UTI   Abx - vancomycin 1gram 02/02/19, 02/03/19 levaquin 750mg  02/08/19 Fosfomycin- 8/4/, 8/6, 8/8, 8/11  LDA Patient came to [facility] with Foley from [facility].  01/26/19 wound vac rt hip  Past Medical History:  Diagnosis Date   Arthritis    "back, hands, knees" (04/10/2016)   Chronic back pain    Chronic combined systolic (congestive) and diastolic (congestive) heart failure (HCC)    a. EF 25% by echo in 08/2015 b. RHC in 08/2015 showed normal filling pressures; c. 10/2018 Echo (in setting of atrial tachycardia): EF 25-30%, impaired relaxation. Glob HK. RVSP 51mmHg. Mod dil RA/LA. Mech MV w/ nl fxn (mean grad 63mmHg). Mod TR.    Chronic kidney disease    Compression fracture    "several; all in my back" (04/10/2016)   GERD (gastroesophageal reflux disease)    Hypertension    Lymphedema    Pacemaker 05/2017   PAF (paroxysmal atrial fibrillation) ([place])    a. On amio & Coumadin - CHA2DS2VASc = 4.   S/P MVR (mitral valve replacement)    a. MVR 1994 b. redo MVR in 08/2014 - on Coumadin   Shortness of breath dyspnea     Past Surgical History:  Procedure Laterality Date   BREAST LUMPECTOMY Right 2005?   benign lump excision   CARDIAC CATHETERIZATION     CARDIAC VALVE REPLACEMENT  1994, 2016   "MVR; MVR"  CARDIOVERSION N/A 11/06/2018   Procedure: CARDIOVERSION;  Surgeon: Minna Merritts, MD;  Location: [facility] ORS;  Service: Cardiovascular;  Laterality: N/A;   CATARACT EXTRACTION W/PHACO Right 02/20/2015   Procedure: CATARACT EXTRACTION PHACO AND INTRAOCULAR LENS PLACEMENT ([place]);  Surgeon: Estill Cotta, MD;  Location: [facility] ORS;  Service: Ophthalmology;  Laterality: Right;  US01:31.2 AP 25.3% CDE40.13 Fluid lot # [id number]   DRESSING CHANGE UNDER ANESTHESIA  Left 11/17/2018   Procedure: DRESSING CHANGE;  Surgeon: Robert Bellow, MD;  Location: [facility] ORS;  Service: General;  Laterality: Left;  no anesthesia   ELECTROPHYSIOLOGIC STUDY N/A 10/09/2015   Procedure: CARDIOVERSION;  Surgeon: Wellington Hampshire, MD;  Location: [facility] ORS;  Service: Cardiovascular;  Laterality: N/A;   INCISION AND DRAINAGE OF WOUND Left 11/13/2018   Procedure: LEFT LEG DRESSING CHANGE;  Surgeon: Robert Bellow, MD;  Location: [facility] ORS;  Service: General;  Laterality: Left;   INCISION AND DRAINAGE OF WOUND Left 11/19/2018   Procedure: LEFT LEG DRESSING CHANGE AND GRAFT APPLICATION;  Surgeon: Robert Bellow, MD;  Location: [facility] ORS;  Service: General;  Laterality: Left;   IR GENERIC HISTORICAL  04/01/2016   IR RADIOLOGIST EVAL & MGMT 04/01/2016 [facility]   IR GENERIC HISTORICAL  04/15/2016   IR VERTEBROPLASTY CERV/THOR BX INC UNI/BIL INC/INJECT/IMAGING 04/15/2016 Luanne Bras, MD [facility]   IR GENERIC HISTORICAL  04/15/2016   IR VERTEBROPLASTY EA ADDL (T&LS) BX INC UNI/BIL INC INJECT/IMAGING 04/15/2016 Luanne Bras, MD [facility]   IR GENERIC HISTORICAL  04/15/2016   IR VERTEBROPLASTY EA ADDL (T&LS) BX INC UNI/BIL INC INJECT/IMAGING 04/15/2016 Luanne Bras, MD [facility]   IR GENERIC HISTORICAL  05/20/2016   IR RADIOLOGIST EVAL & MGMT 05/20/2016 [facility]   IR GENERIC HISTORICAL  06/13/2016   IR VERTEBROPLASTY EA ADDL (T&LS) BX INC UNI/BIL INC INJECT/IMAGING 06/13/2016 Luanne Bras, MD [facility]   IR GENERIC HISTORICAL  06/13/2016   IR SACROPLASTY BILATERAL 06/13/2016 Luanne Bras, MD [facility]   IR GENERIC HISTORICAL  06/13/2016   IR VERTEBROPLASTY LUMBAR BX INC UNI/BIL INC/INJECT/IMAGING 06/13/2016 Luanne Bras, MD [facility]   IRRIGATION AND DEBRIDEMENT HEMATOMA Left 07/05/2015   Procedure: IRRIGATION AND DEBRIDEMENT HEMATOMA;  Surgeon: Robert Bellow, MD;  Location: [facility] OR[facility];  Service: General;   Laterality: Left;   IRRIGATION AND DEBRIDEMENT HEMATOMA Left 11/11/2018   Procedure: IRRIGATION AND DEBRIDEMENT LEFT LEG HEMATOMA;  Surgeon: Robert Bellow, MD;  Location: [facility] ORS;  Service: General;  Laterality: Left;   Open reduction and internal fixation of a right intertrochanteric femur fracture  01/11/2019   Dr. Margarita Rana ([facility])   pyloric stenosis  07/1937   US ECHOCARDIOGRAPHY      Social History   Socioeconomic History   Marital status: Widowed    Spouse name: Not on file   Number of children: Not on file   Years of education: Not on file   Highest education level: Not on file  Occupational History   Not on file  Social Needs   Financial resource strain: Not on file   Food insecurity    Worry: Not on file    Inability: Not on file   Transportation needs    Medical: Not on file    Non-medical: Not on file  Tobacco Use   Smoking status: Former Smoker    Packs/day: 1.00    Years: 27.00    Pack years: 27.00    Types: Cigarettes    Quit date: 07/01/1980  Years since quitting: 38.6   Smokeless tobacco: Never Used   Tobacco comment: "quit smoking ~ 1980  Substance and Sexual Activity   Alcohol use: Not Currently    Alcohol/week: 0.0 standard drinks    Comment: 04/10/2016 "I'll have a drink on holidays/special occasions"   Drug use: No   Sexual activity: Never  Lifestyle   Physical activity    Days per week: Not on file    Minutes per session: Not on file   Stress: Not on file  Relationships   Social connections    Talks on phone: Not on file    Gets together: Not on file    Attends religious service: Not on file    Active member of club or organization: Not on file    Attends meetings of clubs or organizations: Not on file    Relationship status: Not on file   Intimate partner violence    Fear of current or ex partner: Not on file    Emotionally abused: Not on file    Physically abused: Not on file    Forced sexual activity: Not on  file  Other Topics Concern   Not on file  Social History Narrative   Not on file    Family History  Problem Relation Age of Onset   Stroke Mother    Renal Disease Father    Allergies  Allergen Reactions   Penicillins Rash    ** daughter reports facial swelling**   Has patient had a PCN reaction causing immediate rash, facial/tongue/throat swelling, SOB or lightheadedness with hypotension: YES Has patient had a PCN reaction causing severe rash involving mucus membranes or skin necrosis: NO Has patient had a PCN reaction that required hospitalization NO Has patient had a PCN reaction occurring within the last 10 years: NO If all of the above answers are "NO", then may proceed with Cephalosporin use.   Ace Inhibitors     Cough   Doxycycline Nausea Only   Hydrocodone Itching and Nausea Only   Mercury     Other reaction(s): Unknown   Silver Dermatitis    Severe itching   Cephalexin Rash   Clindamycin/Lincomycin Rash   Nickel Rash   Current med Tramadol- 50mg  as needed- received today  melatonin PRN  Coumadin Amiodarone 200mg  BID Lasix continuous infusion  Abtx:  Anti-infectives (From admission, onward)   Start     Dose/Rate Route Frequency Ordered Stop   02/10/19 1800  levofloxacin (LEVAQUIN) IVPB 500 mg  Status:  Discontinued     500 mg 100 mL/hr over 60 Minutes Intravenous Every 48 hours 02/08/19 1630 02/09/19 1513   02/08/19 1645  levofloxacin (LEVAQUIN) IVPB 750 mg     750 mg 100 mL/hr over 90 Minutes Intravenous  Once 02/08/19 1630 02/08/19 1932   02/08/19 1600  aztreonam (AZACTAM) 0.5 g in dextrose 5 % 50 mL IVPB  Status:  Discontinued     0.5 g 100 mL/hr over 30 Minutes Intravenous Every 8 hours 02/08/19 1521 02/08/19 1629   02/03/19 1800  vancomycin (VANCOCIN) IVPB 1000 mg/200 mL premix     1,000 mg 200 mL/hr over 60 Minutes Intravenous  Once 02/03/19 1653 02/03/19 1904   02/02/19 1500  vancomycin (VANCOCIN) 1,000 mg in sodium chloride 0.9 % 250  mL IVPB  Status:  Discontinued     1,000 mg 250 mL/hr over 60 Minutes Intravenous  Once 02/02/19 1406 02/02/19 1420   02/02/19 1500  vancomycin (VANCOCIN) IVPB 1000 mg/200 mL premix  1,000 mg 200 mL/hr over 60 Minutes Intravenous  Once 02/02/19 1420 02/02/19 1550   02/02/19 1405  vancomycin variable dose per unstable renal function (pharmacist dosing)  Status:  Discontinued      Does not apply See admin instructions 02/02/19 1406 02/04/19 1255      REVIEW OF SYSTEMS:  No history available from patient. Son and daughter says that she has become frail and has been deteriorating over the past months.  They are worried that she is been confused and is concerned about UTI.  Objective:  VITALS:  BP 95/68    Pulse 99    Temp 97.6 F (36.4 C) (Oral)    Resp (!) 24    Ht 4\' 11"  (1.499 m)    Wt 52.2 kg    SpO2 99%    BMI 23.23 kg/m  PHYSICAL EXAM:   Family did not want me to examine the patient because she is sleeping and they say for the past 2 nights she did not sleep well and today is the first time she is getting some rest.  She has bilateral leg dressings She has a Foley catheter She has a wound VAC on the right hip She has a sacral decubitus and I looked at the pictures which the daughter had.       Pertinent Labs Lab Results CBC    Component Value Date/Time   WBC 8.8 02/10/2019 0613   RBC 3.43 (L) 02/10/2019 0613   HGB 9.8 (L) 02/10/2019 0613   HGB 11.9 (L) 03/27/2012 0418   HCT 32.6 (L) 02/10/2019 0613   HCT 36.0 03/27/2012 0418   PLT 270 02/10/2019 0613   PLT 198 03/27/2012 0418   MCV 95.0 02/10/2019 0613   MCV 92 03/27/2012 0418   MCH 28.6 02/10/2019 0613   MCHC 30.1 02/10/2019 0613   RDW 18.6 (H) 02/10/2019 0613   RDW 13.8 03/27/2012 0418   LYMPHSABS 0.4 (L) 02/08/2019 0405   LYMPHSABS 1.2 03/27/2012 0418   MONOABS 0.7 02/08/2019 0405   MONOABS 0.7 03/27/2012 0418   EOSABS 0.0 02/08/2019 0405   EOSABS 0.4 03/27/2012 0418   BASOSABS 0.1 02/08/2019 0405    BASOSABS 0.1 03/27/2012 0418    CMP Latest Ref Rng & Units 02/10/2019 02/09/2019 02/08/2019  Glucose 70 - 99 mg/dL 88 97 97  BUN 8 - 23 mg/dL 58(H) 51(H) 47(H)  Creatinine 0.44 - 1.00 mg/dL 2.84(H) 2.69(H) 2.28(H)  Sodium 135 - 145 mmol/L 135 131(L) 137  Potassium 3.5 - 5.1 mmol/L 4.1 4.0 3.8  Chloride 98 - 111 mmol/L 94(L) 91(L) 97(L)  CO2 22 - 32 mmol/L 25 26 27   Calcium 8.9 - 10.3 mg/dL 8.6(L) 8.7(L) 9.0  Total Protein 6.5 - 8.1 g/dL - - -  Total Bilirubin 0.3 - 1.2 mg/dL - - -  Alkaline Phos 38 - 126 U/L - - -  AST 15 - 41 U/L - - -  ALT 0 - 44 U/L - - -      Microbiology: Recent Results (from the past 240 hour(s))  Urine Culture     Status: Abnormal   Collection Time: 01/31/19  2:18 PM   Specimen: Urine, Random  Result Value Ref Range Status   Specimen Description   Final    URINE, RANDOM Performed at [facility], [address], [place], [place] [address]    Special Requests   Final    NONE Performed at [facility], [address], [place], [place] [address]  Culture >=100,000 COLONIES/mL ENTEROCOCCUS FAECALIS (A)  Final   Report Status 02/02/2019 FINAL  Final   Organism ID, Bacteria ENTEROCOCCUS FAECALIS (A)  Final      Susceptibility   Enterococcus faecalis - MIC*    AMPICILLIN <=2 SENSITIVE Sensitive     LEVOFLOXACIN 0.5 SENSITIVE Sensitive     NITROFURANTOIN <=16 SENSITIVE Sensitive     VANCOMYCIN 1 SENSITIVE Sensitive     * >=100,000 COLONIES/mL ENTEROCOCCUS FAECALIS    IMAGING RESULTS:   I have personally reviewed the films  The left ventricle has severely reduced systolic function, with an ejection fraction of 25-30%. The cavity size was mildly dilated. Left ventricular diastolic Doppler parameters are consistent with impaired relaxation. Left ventrical global  hypokinesis without regional wall motion abnormalities.  2. The right ventricle has mildly reduced systolic function. The cavity was moderately enlarged. There is no  increase in right ventricular wall thickness. Right ventricular systolic pressure is moderately elevated with an estimated pressure of 53 mmHg.  3. Right atrial size was moderately dilated.  4. Left atrial size was moderately dilated. ? Impression/Recommendation  Confusion.  Metabolic encephalopathy.  Could be multiple etiology.  Could be from the worsening creatinine and declining renal function with pain medicines like tramadol staying in the system for longer period of time causing confusion. Also could be from CO2 narcosis.  I very much doubt that the urine enterococcus is causing the confusion.  She does have a sacral decubitus which need to be examined.  Explained to the family that she has been adequately treated for the Enterococcus faecalis in the urine with multiple courses of different antibiotics.  She received 2 doses of vancomycin, 3 doses of oral fosfomycin and 1 dose of Levaquin. I told them this bacteria could be just colonizing the catheter and may not be a true pathogen and also the pyuria is indication of the Foley.  As she has had a Foley for more than a month recommend changing it and sending a new urine analysis after that.   We will also get a set of blood cultures.  But the family does not want any intervention today because they wanted the patient to rest ? Recommend checking ABG which has been ordered by Dr. Posey Pronto but family did not agree for today  We will hold off on any antibiotics for now.  Sacral decubitus ulcer: Getting Santyl dressing  CHF with mitral valve replacement, has pacemaker  Paroxysmal Afib on coumadin and amiodarone  Acute on chronic anemia   AKi on CKD  Sacral decub  Large rt hip hematoma  Discussed with son and daughter in great detail Discussed with her nurse. Note:  This document was prepared using Dragon voice recognition software and may include unintentional dictation errors.

## 2019-02-10 NOTE — Care Management Important Message (Signed)
Important Message  Patient Details  Name: [name] MRN: [id number] Date of Birth: 01/20/1935   Medicare Important Message Given:  Yes     Juliann Pulse A Rachella Basden 02/10/2019, 3:46 PM

## 2019-02-10 NOTE — Progress Notes (Signed)
Arrived for abg. Daughter states patient has been somewhat difficult to arouse however she has been up during the night for at least the last 2 nights so now she is sleeping more. She has asked that I wait until around 130 for abg since patient has been given pain meds and may not be as easily agitated. Will followup.

## 2019-02-11 ENCOUNTER — Inpatient Hospital Stay: Payer: Medicare Other | Admitting: Anesthesiology

## 2019-02-11 ENCOUNTER — Encounter: Admission: AD | Disposition: E | Payer: Self-pay | Source: Other Acute Inpatient Hospital | Attending: Internal Medicine

## 2019-02-11 ENCOUNTER — Encounter: Payer: Self-pay | Admitting: Cardiovascular Disease

## 2019-02-11 ENCOUNTER — Other Ambulatory Visit: Payer: Self-pay

## 2019-02-11 ENCOUNTER — Inpatient Hospital Stay: Payer: Medicare Other

## 2019-02-11 DIAGNOSIS — E43 Unspecified severe protein-calorie malnutrition: Secondary | ICD-10-CM

## 2019-02-11 DIAGNOSIS — I4891 Unspecified atrial fibrillation: Secondary | ICD-10-CM

## 2019-02-11 DIAGNOSIS — R0602 Shortness of breath: Secondary | ICD-10-CM

## 2019-02-11 DIAGNOSIS — I509 Heart failure, unspecified: Secondary | ICD-10-CM

## 2019-02-11 HISTORY — PX: CARDIOVERSION: SHX1299

## 2019-02-11 LAB — CBC
HCT: 30.6 % — ABNORMAL LOW (ref 36.0–46.0)
Hemoglobin: 9 g/dL — ABNORMAL LOW (ref 12.0–15.0)
MCH: 28.2 pg (ref 26.0–34.0)
MCHC: 29.4 g/dL — ABNORMAL LOW (ref 30.0–36.0)
MCV: 95.9 fL (ref 80.0–100.0)
Platelets: 242 10*3/uL (ref 150–400)
RBC: 3.19 MIL/uL — ABNORMAL LOW (ref 3.87–5.11)
RDW: 18.3 % — ABNORMAL HIGH (ref 11.5–15.5)
WBC: 8.1 10*3/uL (ref 4.0–10.5)
nRBC: 0 % (ref 0.0–0.2)

## 2019-02-11 LAB — PROTIME-INR
INR: 4.7 (ref 0.8–1.2)
Prothrombin Time: 43.7 seconds — ABNORMAL HIGH (ref 11.4–15.2)

## 2019-02-11 LAB — URINALYSIS, ROUTINE W REFLEX MICROSCOPIC
Bacteria, UA: NONE SEEN
Bilirubin Urine: NEGATIVE
Bilirubin Urine: NEGATIVE
Glucose, UA: NEGATIVE mg/dL
Glucose, UA: NEGATIVE mg/dL
Ketones, ur: NEGATIVE mg/dL
Ketones, ur: NEGATIVE mg/dL
Nitrite: NEGATIVE
Nitrite: NEGATIVE
Protein, ur: 100 mg/dL — AB
Protein, ur: 100 mg/dL — AB
RBC / HPF: >50 RBC/hpf — ABNORMAL HIGH (ref 0–5)
RBC / HPF: >50 RBC/hpf — ABNORMAL HIGH (ref 0–5)
Specific Gravity, Urine: 1.013 (ref 1.005–1.030)
Specific Gravity, Urine: 1.012 (ref 1.005–1.030)
Squamous Epithelial / HPF: NONE SEEN (ref 0–5)
WBC, UA: >50 WBC/hpf — ABNORMAL HIGH (ref 0–5)
WBC, UA: >50 WBC/hpf — ABNORMAL HIGH (ref 0–5)
pH: 5 (ref 5.0–8.0)
pH: 5 (ref 5.0–8.0)

## 2019-02-11 LAB — BASIC METABOLIC PANEL
Anion gap: 15 (ref 5–15)
BUN: 64 mg/dL — ABNORMAL HIGH (ref 8–23)
CO2: 25 mmol/L (ref 22–32)
Calcium: 8.2 mg/dL — ABNORMAL LOW (ref 8.9–10.3)
Chloride: 91 mmol/L — ABNORMAL LOW (ref 98–111)
Creatinine, Ser: 3 mg/dL — ABNORMAL HIGH (ref 0.44–1.00)
GFR calc Af Amer: 16 mL/min — ABNORMAL LOW (ref >60)
GFR calc non Af Amer: 14 mL/min — ABNORMAL LOW (ref >60)
Glucose, Bld: 88 mg/dL (ref 70–99)
Potassium: 4.2 mmol/L (ref 3.5–5.1)
Sodium: 131 mmol/L — ABNORMAL LOW (ref 135–145)

## 2019-02-11 SURGERY — CARDIOVERSION
Anesthesia: General

## 2019-02-11 MED ORDER — PROPOFOL 10 MG/ML IV BOLUS
INTRAVENOUS | Status: DC | PRN
  Administered 2019-02-11 (×2): 10 mg via INTRAVENOUS

## 2019-02-11 NOTE — Anesthesia Postprocedure Evaluation (Signed)
Anesthesia Post Note  Patient: [name]  Procedure(s) Performed: CARDIOVERSION (N/A )  Patient location during evaluation: PACU Anesthesia Type: General Level of consciousness: awake and alert Pain management: pain level controlled Vital Signs Assessment: post-procedure vital signs reviewed and stable Respiratory status: spontaneous breathing, nonlabored ventilation and respiratory function stable Cardiovascular status: blood pressure returned to baseline and stable Postop Assessment: no apparent nausea or vomiting Anesthetic complications: no     Last Vitals:  Vitals:   02/14/2019 0900 02/10/2019 0929  BP: 110/71 (!) 93/59  Pulse: (!) 58 (!) 59  Resp: 16 16  Temp:    SpO2: (!) 76% 99%    Last Pain:  Vitals:   02/26/2019 1040  TempSrc:   PainSc: [place]

## 2019-02-11 NOTE — Progress Notes (Signed)
[name] at [place] NAME: [name]    MR#:  [id number]  DATE OF BIRTH:  1935/02/24  SUBJECTIVE:  CHIEF COMPLAINT:  No chief complaint on file.  Patient continues to be very drowsy.  I had ordered ABG but patient daughter did not want her to have an ABG Patient's renal function is worsening as well.  She is not eating or drinking much As per patient's request patient underwent cardioversion again today.     REVIEW OF SYSTEMS:  Review of Systems  Unable to perform ROS: Acuity of condition     DRUG ALLERGIES:   Allergies  Allergen Reactions  . Penicillins Rash    ** daughter reports facial swelling**   Has patient had a PCN reaction causing immediate rash, facial/tongue/throat swelling, SOB or lightheadedness with hypotension: YES Has patient had a PCN reaction causing severe rash involving mucus membranes or skin necrosis: NO Has patient had a PCN reaction that required hospitalization NO Has patient had a PCN reaction occurring within the last 10 years: NO If all of the above answers are "NO", then may proceed with Cephalosporin use.  . Ace Inhibitors     Cough  . Doxycycline Nausea Only  . Hydrocodone Itching and Nausea Only  . Mercury     Other reaction(s): Unknown  . Silver Dermatitis    Severe itching  . Cephalexin Rash  . Clindamycin/Lincomycin Rash  . Nickel Rash   VITALS:  Blood pressure (!) 93/59, pulse (!) 59, temperature 97.6 F (36.4 C), temperature source Oral, resp. rate 16, height 4\' 11"  (1.499 m), weight 52.2 kg, SpO2 99 %. PHYSICAL EXAMINATION:  Physical Exam  General patient chronically ill-appearing currently lethargic HENT:  Nose: No mucosal edema.  Mouth/Throat: No oropharyngeal exudate or posterior oropharyngeal edema.  Eyes: Pupils are equal, round, and reactive to light. Conjunctivae,  Neck: No JVD present. Carotid bruit is not present. No edema present. No thyroid mass and no  thyromegaly present.  Cardiovascular: S1 normal and S2 normal.  Murmur heard.  Systolic murmur is present with a grade of 3/6. tachycardia  Respiratory: No respiratory distress.no wheezing.  No rales  GI: Soft. Bowel sounds are normal. There is no abdominal tenderness.  Musculoskeletal:     Right ankle: She exhibits swelling.     Left ankle: She exhibits swelling.     Comments: Right hip status post surgery with wound VAC and edema  Lymphadenopathy:    She has no cervical adenopathy.  Neurological: Lethargic Skin: Skin is warm. Nails show no clubbing.  Sacral decubitus ulcer now unstageable Psychiatric: Lethargic  Foley catheter in place, skin lesions on leg and sacrum.  LABORATORY PANEL:  Female CBC Recent Labs  Lab 02/05/2019 0547  WBC 8.1  HGB 9.0*  HCT 30.6*  PLT 242   ------------------------------------------------------------------------------------------------------------------ Chemistries  Recent Labs  Lab 02/10/19 0613 02/02/2019 0547  NA 135 131*  K 4.1 4.2  CL 94* 91*  CO2 25 25  GLUCOSE 88 88  BUN 58* 64*  CREATININE 2.84* 3.00*  CALCIUM 8.6* 8.2*  MG 2.6*  --    RADIOLOGY:  No results found. ASSESSMENT AND PLAN:   1. Acute encephalopathy could be related to UTI however patient has no evidence of fever WBC count is normal limited options for antibiotics.  Family  requested infectious disease consult which has been ordered our Dr. Tama High will be seeing the patient.  Family very insistent on initiating antibiotics.  Also  due to lethargy I asked for an ABG.    2. Acute on chronic combined systolic and diastolic heart failure.   Continue IV Lasix  3. paroxysmal atrial fibrillation with some atrial tachycardia and history of mitral valve replacement on amiodarone and Coumadin-continue amiodarone, due to INR being high Coumadin is being held Family does not want patient to be started on beta-blocker Patient underwent cardioversion now in normal sinus  rhythm    4. Acute blood loss anemia secondary to large right hip hematoma and skin tear right leg.  Also has other hematomas on the legs. Hemoglobin stable. Monitor.  Hemoglobin stable today  5. Hip fracture status post operative repair at [facility].  With hematoma physical therapy recommending rehab.  Family wants patient to go home once clinically more stable and has enough endurance and strength.  Do not want SNF placement.  Continue wound VAC, care per orhtopedics   6. AKI on Chronic kidney disease stage III.  With cardiorenal component and vancomycin, closely monitor with diuresis lasix drip. IV vancomycin previously. Family states that they do not want any hemodialysis for now they are deciding until tomorrow  7. sacral decubiti-unstageable with some tunneling.  Newly developing right heel pressure ulcer pressure pads applied.   Surgery following patient.  wound care.  Encouraged to reposition patient frequently-every 1-2 hours 8. Large right hip hematoma.  Needs to be on Coumadin secondary to metallic mitral valve.  Continue to monitor.  Patient has been followed by orthopedics here .  Wound VAC placed.F/U with ortho regarding wound vac continuity as drainage has decreased. 9. Skin tear right lower extremity, left upper extremity supportive care.  Daily wound dressings and wound care team follow-up.Wound care dressings every alternate day.  Patient nutrition is very poor I recommended possible NG tube placement for feeding again family is making all medical decisions for the patient they do not want NG tube right now.     CODE STATUS: DNR  TOTAL TIME TAKING CARE OF THIS PATIENT: 37 minutes.   More than 50% of the time was spent in counseling/coordination of care: YES  POSSIBLE D/C IN 2 DAYS, DEPENDING ON CLINICAL CONDITION.   Dustin Flock M.D on 02/26/2019 at 2:24 PM  Between 7am to 6pm - Pager - [phone]  After 6pm go to [email or web address] - Proofreader  [employer]  [place]  Office  [phone]  CC: Primary care physician; Rusty Aus, MD  Note: This dictation was prepared with Dragon dictation along with smaller phrase technology. Any transcriptional errors that result from this process are unintentional.

## 2019-02-11 NOTE — Anesthesia Post-op Follow-up Note (Signed)
Anesthesia QCDR form completed.        

## 2019-02-11 NOTE — Progress Notes (Signed)
ID Had cardioversion today Still on IV lasix Foley catheter changed early this morning As per son she had called 108 and sent them to the [facility]. Had received 20 mg propofol today before the cardioversion this morning..  Patient Vitals for the past 24 hrs:  BP Temp Temp src Pulse Resp SpO2  02/10/2019 1522 - 97.6 F (36.4 C) - - - -  02/08/2019 1513 102/73 (!) 96.1 F (35.6 C) Axillary 60 18 91 %  02/27/2019 0929 (!) 93/59 - - (!) 59 16 99 %  02/12/2019 0900 110/71 - - (!) 58 16 (!) 76 %  02/17/2019 0845 112/72 - - (!) 59 19 100 %  02/27/2019 0830 102/71 - - (!) 59 - 100 %  02/17/2019 0825 106/71 - - 97 16 100 %  02/21/2019 0729 96/73 - - 97 18 98 %  02/26/2019 0652 - - - 80 - 91 %  01/31/2019 0411 90/64 - - 96 18 100 %    She is back from x-ray of the sacrum Lying with eyes closed but responds to questions appropriately.  Says she is cold. Right lateral thigh wound VAC present bloody fluid. Bilateral leg dressing.  CBC Latest Ref Rng & Units 02/14/2019 02/10/2019 02/09/2019  WBC 4.0 - 10.5 K/uL 8.1 8.8 8.8  Hemoglobin 12.0 - 15.0 g/dL 9.0(L) 9.8(L) 8.4(L)  Hematocrit 36.0 - 46.0 % 30.6(L) 32.6(L) 28.7(L)  Platelets 150 - 400 K/uL 242 270 273    CMP Latest Ref Rng & Units 02/27/2019 02/10/2019 02/09/2019  Glucose 70 - 99 mg/dL 88 88 97  BUN 8 - 23 mg/dL 64(H) 58(H) 51(H)  Creatinine 0.44 - 1.00 mg/dL 3.00(H) 2.84(H) 2.69(H)  Sodium 135 - 145 mmol/L 131(L) 135 131(L)  Potassium 3.5 - 5.1 mmol/L 4.2 4.1 4.0  Chloride 98 - 111 mmol/L 91(L) 94(L) 91(L)  CO2 22 - 32 mmol/L 25 25 26   Calcium 8.9 - 10.3 mg/dL 8.2(L) 8.6(L) 8.7(L)  Total Protein 6.5 - 8.1 g/dL - - -  Total Bilirubin 0.3 - 1.2 mg/dL - - -  Alkaline Phos 38 - 126 U/L - - -  AST 15 - 41 U/L - - -  ALT 0 - 44 U/L - - -    Impression/Recommendation  Confusion.  Metabolic encephalopathy.  Could be multiple etiology.  Could be from the worsening creatinine and declining renal function with pain medicines like tramadol  staying in the system for longer period of time causing confusion.  Also received propofol today for cardioversion and calling 911 after that could be a consequence of that. Also could be from CO2 narcosis.  I very much doubt that the urine enterococcus is causing the confusion.  She has been treated adequately for the enterococcus with multiple courses of antibiotics. She does have a sacral decubitus which need to be examined.  Explained to the family that she has been adequately treated for the Enterococcus faecalis in the urine with multiple courses of different antibiotics.  She received 2 doses of vancomycin, 3 doses of oral fosfomycin and 1 dose of Levaquin. I told them this bacteria could be just colonizing the catheter and may not be a true pathogen and also the pyuria is indication of the Foley.  As she has had a Foley for more than a month recommended changing the foley which was done early this morning' Blood culture sent this morning. Recommend checking ABG which has been ordered by Dr. Posey Pronto but family did not agree as they said she she  will not take CPAP.  But it would help to figure out whether she has retained CO2. And that could  also cause confusion  Sacral decubitus ulcer: Debridement done at bedside on 02/04/2019.  Getting Santyl dressing.  Surgeon ordered x-ray.  CHF with mitral valve replacement, has pacemaker  A. fib had cardioversion this morning and now in sinus rhythm.  Acute on chronic anemia   AKi on CKD  Sacral decub  Large rt hip hematoma: Has a wound VAC  Discussed the management with her daughter and son.

## 2019-02-11 NOTE — Transfer of Care (Signed)
Immediate Anesthesia Transfer of Care Note  Patient: [name]  Procedure(s) Performed: CARDIOVERSION (N/A )  Patient Location: PACU  Anesthesia Type:General  Level of Consciousness: sedated and patient cooperative  Airway & Oxygen Therapy: Patient Spontanous Breathing and Patient connected to nasal cannula oxygen  Post-op Assessment: Report given to RN and Post -op Vital signs reviewed and stable  Post vital signs: Reviewed and stable  Last Vitals:  Vitals Value Taken Time  BP 106/71 02/04/2019 0825  Temp    Pulse 97 02/06/2019 0825  Resp 16 02/10/2019 0825  SpO2 100 % 02/14/2019 0825    Last Pain:  Vitals:   02/13/2019 0825  TempSrc:   PainSc: Asleep      Patients Stated Pain Goal: 0 ([id number]  Complications: No apparent anesthesia complications

## 2019-02-11 NOTE — Progress Notes (Signed)
Previous foley catheter discontinued with a new foley catheter insertion. Patient tolerated procedure well.  No other issues noted at this time.  Will continue to monitor.

## 2019-02-11 NOTE — Progress Notes (Signed)
Progress Note  Patient Name: [name] Date of Encounter: 02/24/2019  Primary Cardiologist: Ida Rogue, MD   Subjective   lethargic, but improved compared to yesterday Arousable but goes back to sleep Plan for cardioversion this AM, daughter aware it is a high risk procedure but wanting to proceed feeling it will assist with fluid management, renal dysfunction, SOB No events overnight  Inpatient Medications    Scheduled Meds:  acetaminophen  650 mg Oral Once   acidophilus  1 capsule Oral Daily   amiodarone  200 mg Oral BID   collagenase   Topical Daily   mouth rinse  15 mL Mouth Rinse BID   multivitamin-lutein  1 capsule Oral Daily   pantoprazole  40 mg Oral Daily   sodium chloride flush  3 mL Intravenous Q12H   sodium chloride flush  3 mL Intravenous Q12H   vitamin C  500 mg Oral Daily   Warfarin - Pharmacist Dosing Inpatient   Does not apply q1800   Continuous Infusions:  sodium chloride     furosemide (LASIX) infusion 10 mg/hr (02/10/19 1349)   PRN Meds: acetaminophen, acetaminophen, calcium carbonate, ipratropium-albuterol, liver oil-zinc oxide, Melatonin, menthol-cetylpyridinium **OR** phenol, metoCLOPramide **OR** metoCLOPramide (REGLAN) injection, nystatin, ondansetron **OR** ondansetron (ZOFRAN) IV, oxyCODONE, sodium chloride flush, traMADol   Vital Signs    Vitals:   02/10/19 1653 02/01/2019 0411 02/02/2019 0652 02/28/2019 0729  BP: 90/63 90/64  96/73  Pulse: 96 96 80 97  Resp:  18  18  Temp:      TempSrc:      SpO2: 100% 100% 91% 98%  Weight:      Height:        Intake/Output Summary (Last 24 hours) at 02/13/2019 0801 Last data filed at 02/20/2019 0155 Gross per 24 hour  Intake 60 ml  Output 750 ml  Net -690 ml   Last 3 Weights 02/10/2019 02/09/2019 02/08/2019  Weight (lbs) 115 lb (No Data) (No Data)  Weight (kg) 52.164 kg (No Data) (No Data)  Some encounter information is confidential and restricted. Go to Review Flowsheets  activity to see all data.      Telemetry    Atrial tachycardia with ventricular pacing.  Heart rate 100  bpm.- Personally Reviewed  ECG    No new tracing.  Physical Exam    Constitutional: No distress, lethargic but arousable, very thin HENT:  Head: Grossly normal Eyes:  no discharge.  Neck: Unable to estimate JVD, no carotid bruits  Cardiovascular: tachycardic, Regular rate and rhythm, no murmurs appreciated Pulmonary/Chest: Shallow breaths, no wheezes , scattered rales,  Dullness at the bases Abdominal: Soft.  no distension.  no tenderness.  Musculoskeletal: Normal range of motion Neurological: Unable to evaluate, muscle atrophy noted Skin: Skin warm and dry Psychiatric: Sleeping, unable to assess   Labs    High Sensitivity Troponin:  No results for input(s): TROPONINIHS in the last 720 hours.    Cardiac EnzymesNo results for input(s): TROPONINI in the last 168 hours. No results for input(s): TROPIPOC in the last 168 hours.   Chemistry Recent Labs  Lab 02/09/19 0508 02/10/19 0613 02/10/2019 0547  NA 131* 135 131*  K 4.0 4.1 4.2  CL 91* 94* 91*  CO2 26 25 25   GLUCOSE 97 88 88  BUN 51* 58* 64*  CREATININE 2.69* 2.84* 3.00*  CALCIUM 8.7* 8.6* 8.2*  GFRNONAA 16* 15* 14*  GFRAA 18* 17* 16*  ANIONGAP 14 16* 15     Hematology Recent Labs  Lab 02/09/19 0508 02/10/19 0613 01/31/2019 0547  WBC 8.8 8.8 8.1  RBC 2.97* 3.43* 3.19*  HGB 8.4* 9.8* 9.0*  HCT 28.7* 32.6* 30.6*  MCV 96.6 95.0 95.9  MCH 28.3 28.6 28.2  MCHC 29.3* 30.1 29.4*  RDW 18.4* 18.6* 18.3*  PLT 273 270 242    BNP Recent Labs  Lab 02/05/19 0556  BNP 1,492.0*     DDimer No results for input(s): DDIMER in the last 168 hours.   Radiology    No results found.  Cardiac Studies   TTE 01/06/2019 [facility]): LVEF ~45%.  Mild RV dysfunction.  Trivial PR and moderate TR.  Moderate aortic stenosis.  Mechanical mitral valve present.  Pulmonary hypertension (RVSP 74 mmHg).  TTE 11/05/2018 1.  The left ventricle has severely reduced systolic function, with an ejection fraction of 25-30%. The cavity size was mildly dilated. Left ventricular diastolic Doppler parameters are consistent with impaired relaxation. Left ventrical global  hypokinesis without regional wall motion abnormalities. 2. The right ventricle has mildly reduced systolic function. The cavity was moderately enlarged. There is no increase in right ventricular wall thickness. Right ventricular systolic pressure is moderately elevated with an estimated pressure of 53 mmHg. 3. Right atrial size was moderately dilated. 4. Left atrial size was moderately dilated. 5. The aortic valve was not well visualized. Moderate calcification of the aortic valve. Aortic valve regurgitation was not assessed by color flow Doppler. Unable to estimate adegree of aortic valve stenosis given severely reduced systolic function. 6. Mechanical mitral valve. Mean gradient 5 mm Hg. 7. Tricuspid valve regurgitation is moderate. 8. Left pleural effusion noted, 6.5 cm 9. Rhythm is atrial tachycardia, rate 105 bpm  Patient Profile     84 y.o. female with a hx of atrial fibrillation, chronic combined systolic and diastolic congestive heart failure, nonischemic cardiomyopathy status post CRT-P(11/2017), left bundle branch block, hypertension, chronic lower extremity edema, mitral valve disease status post mechanical mitral valve replacement on chronic Coumadin, s/p spontaneous left calf hematoma requiring debridement, stage III chronic kidney disease,11/2018 mechanical fall with subsequent R hip fracture and later R hip surgery ([facility]),and seen for management and optimization ofcardiac medications in the setting of the above complex medical and cardiac conditions and s/pright-sided intertrochanteric femur fracture and repair at [facility].  Assessment & Plan     atrial tachycardia: She had cardioversion 11/06/2018 for persistent atrial tachycardia at which  time she had escalating diuretic regiment with worsening renal failure. After restoring normal sinus rhythm had better urine output, improved renal function -Long discussion with daughter yesterday, and today Family would like to pursue cardioversion to restore normal sinus rhythm in effort to facilitate diuresis, improve her shortness of breath, renal function -After discussion the risk and benefit of the procedure,  Daughter and patient willing to proceed,  Family is declining beta-blockers Will continue amiodarone at current doses  Atrial fibrillation, paroxysmal No recent episodes, On warfarin On amiodarone  Acute on chronic systolic and diastolic heart failure: Diuresis challenging in the setting of cardiomyopathy, atrial tachycardia ---NSR now restored this AM after cardioversion, Will discuss diuretic regimen with nephrology. She has poor fluid intake recently  Mechanical mitral valve: INR therapeutic (goal ideally should be 2.5 to 3.5 but appears this may have been reduced to 2-3 given oozing from right groin On warfarin  Anemia: Hemoglobin up to 9.0    thigh hematoma with wound VAC in place Stable  Sacral decub ulcer On tramadol as needed for pain, repositiooning in bed  UTI Foley changed,  ID following Could repeat cultures with new foley in place  Discussed at length with family son and daughter,  Discussed challenges with estimating fluid status  Total encounter time more than 35 minutes  Greater than 50% was spent in counseling and coordination of care with the patient   For questions or updates, please contact [place] Please consult [email or web address] for contact info under [facility] Cardiology.     Signed, Ida Rogue, MD  02/08/2019, 8:01 AM

## 2019-02-11 NOTE — CV Procedure (Signed)
Cardioversion procedure note For atrial tachycardia  Procedure Details:  Consent: Risks of procedure as well as the alternatives and risks of each were explained to the (patient/caregiver). Consent for procedure obtained.  Time Out: Verified patient identification, verified procedure, site/side was marked, verified correct patient position, special equipment/implants available, medications/allergies/relevent history reviewed, required imaging and test results available. Performed  Patient placed on cardiac monitor, pulse oximetry, supplemental oxygen as necessary.  Sedation given:20 mg  propofol IV, Dr. Ola Spurr Pacer pads placed anterior and posterior chest.   Cardioverted 1 time(s).  Cardioverted at 120  J. Synchronized biphasic Converted to NSR   Evaluation: Findings: Post procedure EKG shows: NSR, rate 63 bpm down from [id number] BPM Complications: None Patient did tolerate procedure well.  Time Spent Directly with the Patient:  82 minutes   Esmond Plants, M.D., Ph.D.

## 2019-02-11 NOTE — Progress Notes (Signed)
PT Cancellation Note  Patient Details Name: TIKITA KRIS MRN: [id number] DOB: 02-24-1935   Cancelled Treatment:    Reason Eval/Treat Not Completed: Medical issues which prohibited therapy;  [name] INR currently 4.7.  Will hold PT services this date secondary to pt typically requiring extensive +2 physical assistance with all mobility tasks putting pt at elevated risk for bleeding.  Will attempt to see pt at a future date/time as medically appropriate.     Linus Salmons PT, DPT 02/21/2019, 1:03 PM

## 2019-02-11 NOTE — Consult Note (Signed)
[facility] for Warfarin Dosing  Indication: Mitral Valve Replacement, A. Fib    Allergies  Allergen Reactions  . Penicillins Rash    ** daughter reports facial swelling**   Has patient had a PCN reaction causing immediate rash, facial/tongue/throat swelling, SOB or lightheadedness with hypotension: YES Has patient had a PCN reaction causing severe rash involving mucus membranes or skin necrosis: NO Has patient had a PCN reaction that required hospitalization NO Has patient had a PCN reaction occurring within the last 10 years: NO If all of the above answers are "NO", then may proceed with Cephalosporin use.  . Ace Inhibitors     Cough  . Doxycycline Nausea Only  . Hydrocodone Itching and Nausea Only  . Mercury     Other reaction(s): Unknown  . Silver Dermatitis    Severe itching  . Cephalexin Rash  . Clindamycin/Lincomycin Rash  . Nickel Rash    Patient Measurements: Height: 4\' 11"  (149.9 cm) Weight: 115 lb (52.2 kg) IBW/kg (Calculated) : 43.2   Vital Signs: BP: 96/73 (08/13 0729) Pulse Rate: 97 (08/13 0729)  Labs: Recent Labs    02/09/19 0508 02/10/19 0613 02/22/2019 0547  HGB 8.4* 9.8* 9.0*  HCT 28.7* 32.6* 30.6*  PLT 273 270 242  LABPROT 28.8* 31.9* 43.7*  INR 2.8* 3.2* 4.7*  CREATININE 2.69* 2.84* 3.00*    Estimated Creatinine Clearance: 10.9 mL/min (A) (by C-G formula based on SCr of 3 mg/dL (H)).   Medical History: Past Medical History:  Diagnosis Date  . Arthritis    "back, hands, knees" (04/10/2016)  . Chronic back pain   . 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 EF 25% by echo in 08/2015 b. RHC in 08/2015 showed normal filling pressures; c. 10/2018 Echo (in setting of atrial tachycardia): EF 25-30%, impaired relaxation. Glob HK. RVSP 30mmHg. Mod dil RA/LA. Mech MV w/ nl fxn (mean grad 7mmHg). Mod TR.   Marland Kitchen Chronic kidney disease   . Compression fracture    "several; all in my back" (04/10/2016)  . GERD (gastroesophageal reflux disease)   . Hypertension   . Lymphedema   . Pacemaker 05/2017  . PAF (paroxysmal atrial fibrillation) ([place])    a. On amio & Coumadin - CHA2DS2VASc = 4.  . S/P MVR (mitral valve replacement)    a. MVR 1994 b. redo MVR in 08/2014 - on Coumadin  . Shortness of breath dyspnea    Assessment: Pharmacy consulted for warfarin dosing and monitoring for 84 yo female with PMH of mitral valve replacement and A. Fib. Patient transferred to [facility] 7/20 PM from [place]. Patient's INR is sensitive to changes. We have been giving her a lower dose from her home dose. Patient may need to be discharged on 2 mg daily.   Home dose: warfarin 2.5 mg daily.   DDI: on amiodarone (increase INR); levofloxacin 8/10 started (may significantly increase INR)  Spoke to MD on 7/22 regarding low Hgb- okay with continuing warfarin at lower dose.  Today's INR is supratherapeutic. However, the INR significantly increased likely due to recent boosted doses, a dose of levofloxacin on 8/10, and CHF (fluid changes). Will need to HOLD dose today. Confirmed with nurse, no noted bleeding. Patient had DCCV today and is now NSR. Additionally, patient received a new foley catheter.   DATE INR DOSE 7/23 3.0 1 mg - did not receive.  7/24     2.7  2 mg  7/25 2.4 2 mg 7/26 2.2 2.5 mg 7/27     1.9       4 mg 7/28     1.9       3 mg  7/29 2.3 2.5 mg 7/30     2.9       2 mg 7/31     3.1       2 mg  8/1       4.0       Hold  8/2       3.3       1 mg 8/3 2.7 1 mg 8/4 2.5 1.5 mg - missed this dose 8/5 2.2 1.5 mg  8/6 2.2 2 mg  8/7 2.1 2.5 mg 8/8 2.1 2.5 mg 8/9 2.1 3 mg 8/10 2.1 2.5 mg  8/11 2.8  2 mg  8/12 3.2 1 mg 8/13 4.7 ------    Goal of Therapy:  INR 2.5-3.5 (ideally want INR between 2.5 to 3 per previous notes)- --Addendum 02/08/2019 @1350 . Discussed with Marrianne Mood, PA-C about INR goal of 2-3. Given h/o hematoma will use an INR goal  of 2-3; per Dr.Arida preference for 2.5-3.   Monitor platelets by anticoagulation protocol: Yes   Plan:  INR level is supratherapeutic. Will HOLD warfarin tonight x 1 dose. Hgb 9.0  Plt 242 Daily INR ordered. Pharmacy will continue to follow and order warfarin based on INRs  Rowland Lathe, PharmD 02/18/2019 8:08 AM

## 2019-02-11 NOTE — Progress Notes (Signed)
OT Cancellation Note  Patient Details Name: [name] MRN: [id number] DOB: 06-09-1935   Cancelled Treatment:    Reason Eval/Treat Not Completed: Medical issues which prohibited therapy. Chart reviewed. Pt s/p cardioversion and per Anesthesiology post-procedure note pt was under general anesthesia for this. Per therapy protocol will require new OT orders or a "continue at transfer" noted in current OT order to be able to continue seeing pt for therapy. Also of note, pt's INR currently 4.7. Pt inappropriate for therapy from an INR standpoint secondary to pt typically requiring extensive +2 physical assistance with all mobility tasks putting pt at elevated risk for bleeding.   Jeni Salles, MPH, MS, OTR/L ascom [phone] 02/08/2019, 1:16 PM

## 2019-02-11 NOTE — Anesthesia Preprocedure Evaluation (Addendum)
Anesthesia Evaluation  Patient identified by MRN, date of birth, ID band Patient awake    Reviewed: Allergy & Precautions, H&P , NPO status , Patient's Chart, lab work & pertinent test results  Airway   TM Distance: >3 FB Neck ROM: limited   Comment: Unable to fully comply with mallampati Dental   Pulmonary shortness of breath and Long-Term Oxygen Therapy, COPD,  oxygen dependent, former smoker,           Cardiovascular hypertension, +CHF  + dysrhythmias Atrial Fibrillation + pacemaker   Echo June [id number]: Severe systolic CHF, EF [id number]. Diastolic dysfunction. Severe pulmonary hypertension with elevated RV pressures Mechanical mitral valve Moderate TR Left pleural effusion noted, 6.5 cm    Neuro/Psych negative neurological ROS  negative psych ROS   GI/Hepatic Neg liver ROS, GERD  Controlled,  Endo/Other  negative endocrine ROS  Renal/GU CRFRenal disease  negative genitourinary   Musculoskeletal  (+) Arthritis ,   Abdominal   Peds  Hematology negative hematology ROS (+)   Anesthesia Other Findings Somnolent but answers questions. Extremely frail, cachectic appearing  Prolonged hospitalization with hip fracture s/p repair at [facility], CHF exacerbation with fluid overload unresponsive to Lasix s/p CRRT, respiratory failure.  Now on 2L [place]  Past Medical History: No date: Arthritis     Comment:  "back, hands, knees" (04/10/2016) No date: Chronic back pain No date: Chronic combined systolic (congestive) and diastolic  (congestive) heart failure (HCC)     Comment:  a. EF 25% by echo in 08/2015 b. RHC in 08/2015 showed               normal filling pressures; c. 10/2018 Echo (in setting of               atrial tachycardia): EF 25-30%, impaired relaxation. Glob              HK. RVSP 18mmHg. Mod dil RA/LA. Mech MV w/ nl fxn (mean               grad 69mmHg). Mod TR.  No date: Chronic kidney disease No date: Compression  fracture     Comment:  "several; all in my back" (04/10/2016) No date: GERD (gastroesophageal reflux disease) No date: Hypertension No date: Lymphedema 05/2017: Pacemaker No date: PAF (paroxysmal atrial fibrillation) ([place])     Comment:  a. On amio & Coumadin - CHA2DS2VASc = 4. No date: S/P MVR (mitral valve replacement)     Comment:  a. MVR 1994 b. redo MVR in 08/2014 - on Coumadin No date: Shortness of breath dyspnea  Past Surgical History: 2005?: BREAST LUMPECTOMY; Right     Comment:  benign lump excision No date: [place], 2016: CARDIAC VALVE REPLACEMENT     Comment:  "MVR; MVR" 11/06/2018: CARDIOVERSION; N/A     Comment:  Procedure: CARDIOVERSION;  Surgeon: Minna Merritts,               MD;  Location: [facility] ORS;  Service: Cardiovascular;                Laterality: N/A; 02/20/2015: CATARACT EXTRACTION W/PHACO; Right     Comment:  Procedure: CATARACT EXTRACTION PHACO AND INTRAOCULAR               LENS PLACEMENT ([place]);  Surgeon: Estill Cotta, MD;                Location: [facility] ORS;  Service: Ophthalmology;  Laterality:  Right;  US01:31.2 AP 25.3% CDE40.13 Fluid lot #               [id number] 11/17/2018: DRESSING CHANGE UNDER ANESTHESIA; Left     Comment:  Procedure: DRESSING CHANGE;  Surgeon: Robert Bellow, MD;  Location: [facility] ORS;  Service: General;                Laterality: Left;  no anesthesia 10/09/2015: ELECTROPHYSIOLOGIC STUDY; N/A     Comment:  Procedure: CARDIOVERSION;  Surgeon: Wellington Hampshire,               MD;  Location: [facility] OR[facility];  Service: Cardiovascular;                Laterality: N/A; 11/13/2018: INCISION AND DRAINAGE OF WOUND; Left     Comment:  Procedure: LEFT LEG DRESSING CHANGE;  Surgeon: Robert Bellow, MD;  Location: [facility] OR[facility];  Service: General;                Laterality: Left; 11/19/2018: INCISION AND DRAINAGE OF WOUND; Left     Comment:  Procedure: LEFT LEG DRESSING CHANGE AND GRAFT                APPLICATION;  Surgeon: Robert Bellow, MD;  Location:              [facility];  Service: General;  Laterality: Left; 04/01/2016: IR GENERIC HISTORICAL     Comment:  IR RADIOLOGIST EVAL & MGMT 04/01/2016 [facility] 04/15/2016: IR GENERIC HISTORICAL     Comment:  IR VERTEBROPLASTY CERV/THOR BX INC UNI/BIL               INC/INJECT/IMAGING 04/15/2016 Luanne Bras, MD               [facility] 04/15/2016: IR GENERIC HISTORICAL     Comment:  IR VERTEBROPLASTY EA ADDL (T&LS) BX INC UNI/BIL INC               INJECT/IMAGING 04/15/2016 Luanne Bras, MD [facility]              RAD 04/15/2016: IR GENERIC HISTORICAL     Comment:  IR VERTEBROPLASTY EA ADDL (T&LS) BX INC UNI/BIL INC               INJECT/IMAGING 04/15/2016 Luanne Bras, MD [facility]              RAD 05/20/2016: IR GENERIC HISTORICAL     Comment:  IR RADIOLOGIST EVAL & MGMT 05/20/2016 [facility] 06/13/2016: IR GENERIC HISTORICAL     Comment:  IR VERTEBROPLASTY EA ADDL (T&LS) BX INC UNI/BIL INC               INJECT/IMAGING 06/13/2016 Luanne Bras, MD [facility]              RAD 06/13/2016: IR GENERIC HISTORICAL     Comment:  IR SACROPLASTY BILATERAL 06/13/2016 Luanne Bras,               MD [facility] 06/13/2016: IR GENERIC HISTORICAL     Comment:  IR VERTEBROPLASTY LUMBAR BX INC UNI/BIL               INC/INJECT/IMAGING 06/13/2016 Luanne Bras, MD               [facility] 07/05/2015:  IRRIGATION AND DEBRIDEMENT HEMATOMA; Left     Comment:  Procedure: IRRIGATION AND DEBRIDEMENT HEMATOMA;                Surgeon: Robert Bellow, MD;  Location: [facility] OR[facility];                Service: General;  Laterality: Left; 11/11/2018: IRRIGATION AND DEBRIDEMENT HEMATOMA; Left     Comment:  Procedure: IRRIGATION AND DEBRIDEMENT LEFT LEG HEMATOMA;              Surgeon: Robert Bellow, MD;  Location: [facility] ORS;                Service: General;  Laterality: Left; 01/11/2019: Open reduction and  internal fixation of a right  intertrochanteric femur fracture     Comment:  Dr. Margarita Rana ([facility]) 07/1935: pyloric stenosis No date: US ECHOCARDIOGRAPHY  BMI    Body Mass Index: 23.23 kg/m      Reproductive/Obstetrics negative OB ROS                          Anesthesia Physical Anesthesia Plan  ASA: IV  Anesthesia Plan: General   Post-op Pain Management:    Induction:   PONV Risk Score and Plan:   Airway Management Planned: Natural Airway and Nasal Cannula  Additional Equipment:   Intra-op Plan:   Post-operative Plan:   Informed Consent: I have reviewed the patients History and Physical, chart, labs and discussed the procedure including the risks, benefits and alternatives for the proposed anesthesia with the patient or authorized representative who has indicated his/her understanding and acceptance.     Dental Advisory Given  Plan Discussed with: Anesthesiologist and CRNA  Anesthesia Plan Comments: (Consent obtained via phone from daughter Delorse Lek at [phone] at [place] on 01/30/2019.  KLF)       Anesthesia Quick Evaluation

## 2019-02-11 NOTE — Progress Notes (Signed)
Pt. refused to be turned and repositioned through out the shift. Pillows and position adjusted as much as allowed during this shift.

## 2019-02-11 NOTE — Anesthesia Procedure Notes (Signed)
Date/Time: 02/21/2019 8:04 AM Performed by: Nelda Marseille, CRNA Pre-anesthesia Checklist: Patient identified, Emergency Drugs available, Suction available, Patient being monitored and Timeout performed Oxygen Delivery Method: Nasal cannula

## 2019-02-11 NOTE — Progress Notes (Signed)
[facility]  ROUNDING NOTE   Subjective:  Patient underwent cardioversion today. Now appears to be normal sinus rhythm. Renal function has deteriorated his creatinine up to 3.0. Urine output 750 cc over the preceding 24 hours.    Objective:  Vital signs in last 24 hours:  Pulse Rate:  [58-99] 59 (08/13 0929) Resp:  [16-19] 16 (08/13 0929) BP: (90-112)/(59-73) 93/59 (08/13 0929) SpO2:  [76 %-100 %] 99 % (08/13 0929)  Weight change:  Filed Weights   02/10/19 0500  Weight: 52.2 kg    Intake/Output: I/O last 3 completed shifts: In: 200.3 [P.O.:60; I.V.:140.3] Out: 1050 [Urine:1050]   Intake/Output this shift:  Total I/O In: 100 [I.V.:100] Out: 300 [Drains:300]  Physical Exam: General: Chronically ill-appearing  Head: Normocephalic, atraumatic. Moist oral mucosal membranes  Eyes: Anicteric  Neck: Supple, trachea midline  Lungs:  Bilateral crackles at bases, normal effort  Heart: Regular, ejection click heard  Abdomen:  Soft, nontender  Extremities: ++ peripheral edema, legs wrapped  Neurologic: Lethargic, not following commands  Skin: No lesions  Access: none    Basic Metabolic Panel: Recent Labs  Lab 02/07/19 0634 02/08/19 0405 02/09/19 0508 02/10/19 0613 02/19/2019 0547  NA 137 137 131* 135 131*  K 3.7 3.8 4.0 4.1 4.2  CL 97* 97* 91* 94* 91*  CO2 27 27 26 25 25   GLUCOSE 107* 97 97 88 88  BUN 41* 47* 51* 58* 64*  CREATININE 2.16* 2.28* 2.69* 2.84* 3.00*  CALCIUM 9.0 9.0 8.7* 8.6* 8.2*  MG  --   --  2.4 2.6*  --     Liver Function Tests: No results for input(s): AST, ALT, ALKPHOS, BILITOT, PROT, ALBUMIN in the last 168 hours. No results for input(s): LIPASE, AMYLASE in the last 168 hours. No results for input(s): AMMONIA in the last 168 hours.  CBC: Recent Labs  Lab 02/07/19 0634 02/08/19 0405 02/09/19 0508 02/10/19 0613 02/26/2019 0547  WBC 8.0 9.0 8.8 8.8 8.1  NEUTROABS  --  7.7  --   --   --   HGB 8.7* 8.7* 8.4* 9.8* 9.0*  HCT  29.9* 29.9* 28.7* 32.6* 30.6*  MCV 98.7 96.5 96.6 95.0 95.9  PLT 254 299 273 270 242    Cardiac Enzymes: No results for input(s): CKTOTAL, CKMB, CKMBINDEX, TROPONINI in the last 168 hours.  BNP: Invalid input(s): POCBNP  CBG: No results for input(s): GLUCAP in the last 168 hours.  Microbiology: Results for orders placed or performed during the hospital encounter of 01/20/2019  Novel Coronavirus,NAA,(SEND-OUT TO REF LAB - TAT 24-48 hrs); Hosp Order     Status: None   Collection Time: 01/19/19 12:10 AM   Specimen: Nasopharyngeal Swab; Respiratory  Result Value Ref Range Status   SARS-CoV-2, NAA NOT DETECTED NOT DETECTED Final    Comment: (NOTE) This test was developed and its performance characteristics determined by [employer]. This test has not been FDA cleared or approved. This test has been authorized by FDA under an Emergency Use Authorization (EUA). This test is only authorized for the duration of time the declaration that circumstances exist justifying the authorization of the emergency use of in vitro diagnostic tests for detection of SARS-CoV-2 virus and/or diagnosis of COVID-19 infection under section 564(b)(1) of the Act, 21 U.S.C. KA:123727), unless the authorization is terminated or revoked sooner. When diagnostic testing is negative, the possibility of a false negative result should be considered in the context of a patient's recent exposures and the presence of clinical signs and  symptoms consistent with COVID-19. An individual without symptoms of COVID-19 and who is not shedding SARS-CoV-2 virus would expect to have a negative (not detected) result in this assay. Performed  At: [facility] [address] [place], [place] [id number] Rush Farmer MD [id number]    [name]  Final    Comment: Performed at [facility], [address] [place], [place] [address]  Urine Culture     Status: Abnormal    Collection Time: 01/31/19  2:18 PM   Specimen: Urine, Random  Result Value Ref Range Status   Specimen Description   Final    URINE, RANDOM Performed at [facility], [address] [place], [place] [address]    Special Requests   Final    NONE Performed at [facility] [address] [place], [place] [address]    Culture >=100,000 COLONIES/mL ENTEROCOCCUS FAECALIS (A)  Final   Report Status 02/02/2019 FINAL  Final   Organism ID, Bacteria ENTEROCOCCUS FAECALIS (A)  Final      Susceptibility   Enterococcus faecalis - MIC*    AMPICILLIN <=2 SENSITIVE Sensitive     LEVOFLOXACIN 0.5 SENSITIVE Sensitive     NITROFURANTOIN <=16 SENSITIVE Sensitive     VANCOMYCIN 1 SENSITIVE Sensitive     * >=100,000 COLONIES/mL ENTEROCOCCUS FAECALIS    Coagulation Studies: Recent Labs    02/09/19 0508 02/10/19 0613 02/14/2019 0547  LABPROT 28.8* 31.9* 43.7*  INR 2.8* 3.2* 4.7*    Urinalysis: Recent Labs    02/08/19 1444 02/02/2019 0155  COLORURINE AMBER* AMBER*  LABSPEC 1.017 1.013  PHURINE 5.0 5.0  GLUCOSEU NEGATIVE NEGATIVE  HGBUR SMALL* MODERATE*  BILIRUBINUR NEGATIVE NEGATIVE  KETONESUR NEGATIVE NEGATIVE  PROTEINUR 100* 100*  NITRITE NEGATIVE NEGATIVE  LEUKOCYTESUR LARGE* MODERATE*      Imaging: No results found.   Medications:   . sodium chloride    . furosemide (LASIX) infusion 10 mg/hr (02/10/19 1349)   . acetaminophen  650 mg Oral Once  . acidophilus  1 capsule Oral Daily  . amiodarone  200 mg Oral BID  . collagenase   Topical Daily  . mouth rinse  15 mL Mouth Rinse BID  . multivitamin-lutein  1 capsule Oral Daily  . pantoprazole  40 mg Oral Daily  . sodium chloride flush  3 mL Intravenous Q12H  . sodium chloride flush  3 mL Intravenous Q12H  . vitamin C  500 mg Oral Daily  . Warfarin - Pharmacist Dosing Inpatient   Does not apply q1800   acetaminophen, acetaminophen, calcium carbonate, ipratropium-albuterol, liver oil-zinc oxide, Melatonin,  menthol-cetylpyridinium **OR** phenol, metoCLOPramide **OR** metoCLOPramide (REGLAN) injection, nystatin, ondansetron **OR** ondansetron (ZOFRAN) IV, oxyCODONE, sodium chloride flush, traMADol  Assessment/ Plan:  [name] is a 84 y.o. white female with congestive heart failure EF 25%, hypertension, atrial fibrillation, lymphedema, mitral valve replacement (mechanical valve) requiring chronic anticoagulation, COPD, osteoporosis, history of multiple lumbar compression fractures requiring kyphoplasty, history of left leg hematoma with evacuation and porcine allograft, was admitted on 12/25/2018 with right hip fracture.   1.  Acute kidney injury, chronic kidney disease stage III.  Baseline 1.37/GFR 36 from December 15, 2018 Acute renal failure may be secondary to vancomycin usage. Previously required renal replacement therapy.  -Creatinine worse at the moment and up to 3.0.  Urine output was 750 cc over the preceding 24 hours.  BUN also rising.  Family not sure that they would want to reinitiate dialysis at the moment.  Continue  with Lasix drip for now.  Uremia could potentially worsen given rising BUN.  2.  Anemia of chronic kidney disease.  Hemoglobin down slightly to 9.0.  Continue to monitor.  3.  Chronic systolic congestive heart failure with EF 25%.  Continue Lasix drip for now.  4.  Pulmonary hypertension with respiratory failure: Patient currently on nasal cannula.  5.  Mechanical heart valve requiring chronic anticoagulation. History of endocarditis.    LOS: 24 [name] 8/13/202012:14 PM

## 2019-02-11 NOTE — Progress Notes (Signed)
Ch provided social support to pt's children as the pt underwent a successful cardioversion procedure. Ch was present when family updated by provider following the procedure. Ch allowed pt family space to lament regarding the challenges that she has endure to restore her health. Family is hopeful of a safe transition for pt to receive PT and care at another facility soon.    01/30/2019 0850  Clinical Encounter Type  Visit Type Social support  Spiritual Encounters  Spiritual Needs Emotional;Grief support  Stress Factors  Patient Stress Factors Health changes;Exhausted;Major life changes  Family Stress Factors None identified

## 2019-02-12 DIAGNOSIS — Z8781 Personal history of (healed) traumatic fracture: Secondary | ICD-10-CM

## 2019-02-12 DIAGNOSIS — S72001S Fracture of unspecified part of neck of right femur, sequela: Secondary | ICD-10-CM

## 2019-02-12 DIAGNOSIS — L89154 Pressure ulcer of sacral region, stage 4: Secondary | ICD-10-CM

## 2019-02-12 LAB — BASIC METABOLIC PANEL
Anion gap: 13 (ref 5–15)
BUN: 62 mg/dL — ABNORMAL HIGH (ref 8–23)
CO2: 25 mmol/L (ref 22–32)
Calcium: 8 mg/dL — ABNORMAL LOW (ref 8.9–10.3)
Chloride: 91 mmol/L — ABNORMAL LOW (ref 98–111)
Creatinine, Ser: 2.91 mg/dL — ABNORMAL HIGH (ref 0.44–1.00)
GFR calc Af Amer: 17 mL/min — ABNORMAL LOW (ref >60)
GFR calc non Af Amer: 15 mL/min — ABNORMAL LOW (ref >60)
Glucose, Bld: 77 mg/dL (ref 70–99)
Potassium: 4.3 mmol/L (ref 3.5–5.1)
Sodium: 129 mmol/L — ABNORMAL LOW (ref 135–145)

## 2019-02-12 LAB — HEPATIC FUNCTION PANEL
ALT: 12 U/L (ref 0–44)
AST: 13 U/L — ABNORMAL LOW (ref 15–41)
Albumin: 3.3 g/dL — ABNORMAL LOW (ref 3.5–5.0)
Alkaline Phosphatase: 125 U/L (ref 38–126)
Bilirubin, Direct: 0.3 mg/dL — ABNORMAL HIGH (ref 0.0–0.2)
Indirect Bilirubin: 0.9 mg/dL (ref 0.3–0.9)
Total Bilirubin: 1.2 mg/dL (ref 0.3–1.2)
Total Protein: 5.9 g/dL — ABNORMAL LOW (ref 6.5–8.1)

## 2019-02-12 LAB — CBC
HCT: 27.9 % — ABNORMAL LOW (ref 36.0–46.0)
Hemoglobin: 8.2 g/dL — ABNORMAL LOW (ref 12.0–15.0)
MCH: 27.4 pg (ref 26.0–34.0)
MCHC: 29.4 g/dL — ABNORMAL LOW (ref 30.0–36.0)
MCV: 93.3 fL (ref 80.0–100.0)
Platelets: 218 10*3/uL (ref 150–400)
RBC: 2.99 MIL/uL — ABNORMAL LOW (ref 3.87–5.11)
RDW: 18.1 % — ABNORMAL HIGH (ref 11.5–15.5)
WBC: 8.7 10*3/uL (ref 4.0–10.5)
nRBC: 0 % (ref 0.0–0.2)

## 2019-02-12 LAB — PROTIME-INR
INR: 6.5 (ref 0.8–1.2)
Prothrombin Time: 55.8 seconds — ABNORMAL HIGH (ref 11.4–15.2)

## 2019-02-12 MED ORDER — NYSTATIN 100000 UNIT/GM EX CREA
TOPICAL_CREAM | Freq: Two times a day (BID) | CUTANEOUS | Status: DC
  Administered 2019-02-13 – 2019-02-14 (×4): via TOPICAL
  Filled 2019-02-12 (×3): qty 15

## 2019-02-12 MED ORDER — CHLORHEXIDINE GLUCONATE CLOTH 2 % EX PADS
6.0000 | MEDICATED_PAD | Freq: Every day | CUTANEOUS | Status: DC
  Administered 2019-02-14: 6 via TOPICAL

## 2019-02-12 MED ORDER — SODIUM CHLORIDE 0.9% IV SOLUTION
Freq: Once | INTRAVENOUS | Status: AC
  Administered 2019-02-12: 15:00:00 via INTRAVENOUS

## 2019-02-12 NOTE — Progress Notes (Signed)
[facility] [place] NAME: [name]    MR#:  [id number]  DATE OF BIRTH:  06-22-1935  SUBJECTIVE:  CHIEF COMPLAINT:  No chief complaint on file.  Patient continues to be very drowsy.  INR is elevated son at bedside     REVIEW OF SYSTEMS:  Review of Systems  Unable to perform ROS: Acuity of condition     DRUG ALLERGIES:   Allergies  Allergen Reactions  . Penicillins Rash    ** daughter reports facial swelling**   Has patient had a PCN reaction causing immediate rash, facial/tongue/throat swelling, SOB or lightheadedness with hypotension: YES Has patient had a PCN reaction causing severe rash involving mucus membranes or skin necrosis: NO Has patient had a PCN reaction that required hospitalization NO Has patient had a PCN reaction occurring within the last 10 years: NO If all of the above answers are "NO", then may proceed with Cephalosporin use.  . Ace Inhibitors     Cough  . Doxycycline Nausea Only  . Hydrocodone Itching and Nausea Only  . Mercury     Other reaction(s): Unknown  . Silver Dermatitis    Severe itching  . Cephalexin Rash  . Clindamycin/Lincomycin Rash  . Nickel Rash   VITALS:  Blood pressure (!) 110/57, pulse 62, temperature 97.8 F (36.6 C), resp. rate 16, height 4\' 11"  (1.499 m), weight 54.4 kg, SpO2 98 %. PHYSICAL EXAMINATION:  Physical Exam  General patient chronically ill-appearing currently lethargic HENT:  Nose: No mucosal edema.  Mouth/Throat: No oropharyngeal exudate or posterior oropharyngeal edema.  Eyes: Pupils are equal, round, and reactive to light. Conjunctivae,  Neck: No JVD present. Carotid bruit is not present. No edema present. No thyroid mass and no thyromegaly present.  Cardiovascular: S1 normal and S2 normal.  Murmur heard.  Systolic murmur is present with a grade of 3/6. tachycardia  Respiratory: No respiratory distress.no wheezing.  No rales  GI: Soft. Bowel sounds  are normal. There is no abdominal tenderness.  Musculoskeletal:     Right ankle: She exhibits swelling.     Left ankle: She exhibits swelling.     Comments: Right hip status post surgery with wound VAC and edema  Lymphadenopathy:    She has no cervical adenopathy.  Neurological: Lethargic Skin: Skin is warm. Nails show no clubbing.  Sacral decubitus ulcer now unstageable Psychiatric: Lethargic  Foley catheter in place, skin lesions on leg and sacrum.  LABORATORY PANEL:  Female CBC Recent Labs  Lab 02/12/19 0729  WBC 8.7  HGB 8.2*  HCT 27.9*  PLT 218   ------------------------------------------------------------------------------------------------------------------ Chemistries  Recent Labs  Lab 02/10/19 0613  02/12/19 0730 02/12/19 0847  NA 135   < > 129*  --   K 4.1   < > 4.3  --   CL 94*   < > 91*  --   CO2 25   < > 25  --   GLUCOSE 88   < > 77  --   BUN 58*   < > 62*  --   CREATININE 2.84*   < > 2.91*  --   CALCIUM 8.6*   < > 8.0*  --   MG 2.6*  --   --   --   AST  --   --   --  13*  ALT  --   --   --  12  ALKPHOS  --   --   --  125  BILITOT  --   --   --  1.2   < > = values in this interval not displayed.   RADIOLOGY:  Dg Sacrum/coccyx  Result Date: 02/25/2019 CLINICAL DATA:  Evaluate for sacral/decubitus ulcer EXAM: SACRUM AND COCCYX - 2+ VIEW COMPARISON:  None. FINDINGS: Markedly suboptimal evaluation of the sacrum and coccyx given extensive overlying bowel gas, coarse uterine fibroids, and poor penetration on lateral radiograph due to body habitus. Prior vertebral and sacral augmentations are noted. No acute fracture is evident. Extensive vascular calcium is noted in the pelvis. IMPRESSION: Suboptimal assessment of the sacrum and coccyx due to difficulties with patient positioning and body habitus. Consider contrast-enhanced CT imaging for further evaluation. Electronically Signed   By: Lovena Le M.D.   On: 02/25/2019 19:40   ASSESSMENT AND PLAN:   1.  Acute encephalopathy multifactorial Patient's prognosis is very poor Patient being started on hemodialysis to help with possible uremic encephalopathy  2. Acute on chronic combined systolic and diastolic heart failure.   Continue IV Lasix  3. paroxysmal atrial fibrillation with some atrial tachycardia and history of mitral valve replacement on amiodarone and Coumadin-continue amiodarone, INR elevated Coumadin being held, due to patient receiving  temporary dialysis catheter she will need FFP's explained to the family the need for FFP's they are agreeable for transfusion    4. Acute blood loss anemia secondary to large right hip hematoma and skin tear right leg.  Also has other hematomas on the legs. Hemoglobin stable. Monitor.  Hemoglobin stable   5. Hip fracture status post operative repair at [facility].  With hematoma physical therapy recommending rehab.  Family wants patient to go home once clinically more stable and has enough endurance and strength.  Do not want SNF placement.  Continue wound VAC, care per orhtopedics   6. AKI on Chronic kidney disease stage III.  With cardiorenal component and vancomycin, plan to start him on dialysis  7. sacral decubiti-unstageable with some tunneling.  Newly developing right heel pressure ulcer pressure pads applied.   Surgery following patient.  wound care.  Encouraged to reposition patient frequently-every 1-2 hours  8.Large right hip hematoma.  Needs to be on Coumadin secondary to metallic mitral valve.  Continue to monitor.  Patient has been followed by orthopedics here .  Wound VAC placed.F/U with ortho regarding wound vac continuity as drainage has decreased.   Patient nutrition is very poor I recommended possible NG tube placement for feeding again family do not want her to start feedings    CODE STATUS: DNR  TOTAL TIME TAKING CARE OF THIS PATIENT: 37 minutes.   More than 50% of the time was spent in counseling/coordination of care: YES   POSSIBLE D/C IN 2 DAYS, DEPENDING ON CLINICAL CONDITION.   Dustin Flock M.D on 02/12/2019 at 2:33 PM  Between 7am to 6pm - Pager - [phone]  After 6pm go to [email or web address] - Proofreader  [facility]  Hospitalists  Office  [phone]  CC: Primary care physician; Rusty Aus, MD  Note: This dictation was prepared with Dragon dictation along with smaller phrase technology. Any transcriptional errors that result from this process are unintentional.

## 2019-02-12 NOTE — Progress Notes (Signed)
[facility]  ROUNDING NOTE   Subjective:  Patient still lethargic but arousable. Overall kidney function remains low with an EGFR of 15. Urine output was only 600 cc over the preceding 24 hours. Discussed renal placement therapy with the patient's family. They are agreeable to proceeding with dialysis treatment. INR noted to be 6.5.  Objective:  Vital signs in last 24 hours:  Temp:  [96.1 F (35.6 C)-98.3 F (36.8 C)] 97.8 F (36.6 C) (08/14 0738) Pulse Rate:  [60-63] 62 (08/14 0738) Resp:  [16-20] 16 (08/14 0738) BP: (102-116)/(57-73) 110/57 (08/14 0738) SpO2:  [91 %-98 %] 98 % (08/14 0738) Weight:  [54.4 kg] 54.4 kg (08/14 0542)  Weight change:  Filed Weights   02/10/19 0500 02/12/19 0542  Weight: 52.2 kg 54.4 kg    Intake/Output: I/O last 3 completed shifts: In: 668.5 [P.O.:200; I.V.:468.5] Out: 1350 [Urine:1050; Drains:300]   Intake/Output this shift:  No intake/output data recorded.  Physical Exam: General: Chronically ill-appearing  Head: Normocephalic, atraumatic. Moist oral mucosal membranes  Eyes: Anicteric  Neck: Supple, trachea midline  Lungs:  Bilateral crackles at bases, normal effort  Heart: Regular, ejection click heard  Abdomen:  Soft, nontender  Extremities: + peripheral edema, legs wrapped  Neurologic: Lethargic, not following commands  Skin: No lesions  Access: none    Basic Metabolic Panel: Recent Labs  Lab 02/08/19 0405 02/09/19 0508 02/10/19 0613 02/10/2019 0547 02/12/19 0730  NA 137 131* 135 131* 129*  K 3.8 4.0 4.1 4.2 4.3  CL 97* 91* 94* 91* 91*  CO2 _0 GLUCOSE 97 97 88 88 77  BUN 47* 51* 58* 64* 62*  CREATININE 2.28* 2.69* 2.84* 3.00* 2.91*  CALCIUM 9.0 8.7* 8.6* 8.2* 8.0*  MG  --  2.4 2.6*  --   --     Liver Function Tests: No results for input(s): AST, ALT, ALKPHOS, BILITOT, PROT, ALBUMIN in the last 168 hours. No results for input(s): LIPASE, AMYLASE in the last 168 hours. No results for  input(s): AMMONIA in the last 168 hours.  CBC: Recent Labs  Lab 02/08/19 0405 02/09/19 0508 02/10/19 0613 02/05/2019 0547 02/12/19 0729  WBC 9.0 8.8 8.8 8.1 8.7  NEUTROABS 7.7  --   --   --   --   HGB 8.7* 8.4* 9.8* 9.0* 8.2*  HCT 29.9* 28.7* 32.6* 30.6* 27.9*  MCV 96.5 96.6 95.0 95.9 93.3  PLT 299 273 270 242 218    Cardiac Enzymes: No results for input(s): CKTOTAL, CKMB, CKMBINDEX, TROPONINI in the last 168 hours.  BNP: Invalid input(s): POCBNP  CBG: No results for input(s): GLUCAP in the last 168 hours.  Microbiology: Results for orders placed or performed during the hospital encounter of 01/24/2019  Novel Coronavirus,NAA,(SEND-OUT TO REF LAB - TAT 24-48 hrs); Hosp Order     Status: None   Collection Time: 01/19/19 12:10 AM   Specimen: Nasopharyngeal Swab; Respiratory  Result Value Ref Range Status   SARS-CoV-2, NAA NOT DETECTED NOT DETECTED Final    Comment: (NOTE) This test was developed and its performance characteristics determined by [employer]. This test has not been FDA cleared or approved. This test has been authorized by FDA under an Emergency Use Authorization (EUA). This test is only authorized for the duration of time the declaration that circumstances exist justifying the authorization of the emergency use of in vitro diagnostic tests for detection of SARS-CoV-2 virus and/or diagnosis of COVID-19 infection under section 564(b)(1) of the Act, 21  U.S.C. 360bbb-3(b)(1), unless the authorization is terminated or revoked sooner. When diagnostic testing is negative, the possibility of a false negative result should be considered in the context of a patient's recent exposures and the presence of clinical signs and symptoms consistent with COVID-19. An individual without symptoms of COVID-19 and who is not shedding SARS-CoV-2 virus would expect to have a negative (not detected) result in this assay. Performed  At: [facility] [address]  [place], [place] [id number] Rush Farmer MD OI:[id number]    [place]  Final    Comment: Performed at [facility], [address] [place], [place] [address]  Urine Culture     Status: Abnormal   Collection Time: 01/31/19  2:18 PM   Specimen: Urine, Random  Result Value Ref Range Status   Specimen Description   Final    URINE, RANDOM Performed at [facility], [address] [place], [place] [address]    Special Requests   Final    NONE Performed at [facility], [address] [place], [place] [address]    Culture >=100,000 COLONIES/mL ENTEROCOCCUS FAECALIS (A)  Final   Report Status 02/02/2019 FINAL  Final   Organism ID, Bacteria ENTEROCOCCUS FAECALIS (A)  Final      Susceptibility   Enterococcus faecalis - MIC*    AMPICILLIN <=2 SENSITIVE Sensitive     LEVOFLOXACIN 0.5 SENSITIVE Sensitive     NITROFURANTOIN <=16 SENSITIVE Sensitive     VANCOMYCIN 1 SENSITIVE Sensitive     * >=100,000 COLONIES/mL ENTEROCOCCUS FAECALIS  CULTURE, BLOOD (ROUTINE X 2) w Reflex to ID Panel     Status: None (Preliminary result)   Collection Time: 01/31/2019  5:42 AM   Specimen: BLOOD  Result Value Ref Range Status   Specimen Description BLOOD RIGHT ANTECUBITAL  Final   Special Requests   Final    BOTTLES DRAWN AEROBIC AND ANAEROBIC Blood Culture adequate volume   Culture   Final    NO GROWTH 1 DAY Performed at [facility], [address] [place], [place] [address]    Report Status PENDING  Incomplete  CULTURE, BLOOD (ROUTINE X 2) w Reflex to ID Panel     Status: None (Preliminary result)   Collection Time: 02/21/2019  7:16 AM   Specimen: BLOOD  Result Value Ref Range Status   Specimen Description BLOOD RIGHT [facility]  Final   Special Requests   Final    BOTTLES DRAWN AEROBIC AND ANAEROBIC Blood Culture adequate volume   Culture   Final    NO GROWTH < 24 HOURS Performed at [facility], [address], [place],  [place] [address]    Report Status PENDING  Incomplete    Coagulation Studies: Recent Labs    02/10/19 0613 02/26/2019 0547 02/12/19 0729  LABPROT 31.9* 43.7* 55.8*  INR 3.2* 4.7* 6.5*    Urinalysis: Recent Labs    02/18/2019 0155 02/13/2019 1849  COLORURINE AMBER* YELLOW*  LABSPEC 1.013 1.012  PHURINE 5.0 5.0  GLUCOSEU NEGATIVE NEGATIVE  HGBUR MODERATE* LARGE*  BILIRUBINUR NEGATIVE NEGATIVE  KETONESUR NEGATIVE NEGATIVE  PROTEINUR 100* 100*  NITRITE NEGATIVE NEGATIVE  LEUKOCYTESUR MODERATE* LARGE*      Imaging: Dg Sacrum/coccyx  Result Date: 02/23/2019 CLINICAL DATA:  Evaluate for sacral/decubitus ulcer EXAM: SACRUM AND COCCYX - 2+ VIEW COMPARISON:  None. FINDINGS: Markedly suboptimal evaluation of the sacrum and coccyx given extensive overlying bowel gas, coarse uterine fibroids, and poor penetration on lateral radiograph due to body habitus.  Prior vertebral and sacral augmentations are noted. No acute fracture is evident. Extensive vascular calcium is noted in the pelvis. IMPRESSION: Suboptimal assessment of the sacrum and coccyx due to difficulties with patient positioning and body habitus. Consider contrast-enhanced CT imaging for further evaluation. Electronically Signed   By: Lovena Le M.D.   On: 02/26/2019 19:40     Medications:   . sodium chloride    . furosemide (LASIX) infusion 10 mg/hr (02/12/19 0543)   . acetaminophen  650 mg Oral Once  . acidophilus  1 capsule Oral Daily  . amiodarone  200 mg Oral BID  . collagenase   Topical Daily  . mouth rinse  15 mL Mouth Rinse BID  . multivitamin-lutein  1 capsule Oral Daily  . pantoprazole  40 mg Oral Daily  . sodium chloride flush  3 mL Intravenous Q12H  . sodium chloride flush  3 mL Intravenous Q12H  . vitamin C  500 mg Oral Daily  . Warfarin - Pharmacist Dosing Inpatient   Does not apply q1800   acetaminophen, acetaminophen, calcium carbonate, ipratropium-albuterol, liver oil-zinc oxide, Melatonin,  menthol-cetylpyridinium **OR** phenol, metoCLOPramide **OR** metoCLOPramide (REGLAN) injection, nystatin, ondansetron **OR** ondansetron (ZOFRAN) IV, oxyCODONE, sodium chloride flush, traMADol  Assessment/ Plan:  [name] is a 84 y.o. white female with congestive heart failure EF 25%, hypertension, atrial fibrillation, lymphedema, mitral valve replacement (mechanical valve) requiring chronic anticoagulation, COPD, osteoporosis, history of multiple lumbar compression fractures requiring kyphoplasty, history of left leg hematoma with evacuation and porcine allograft, was admitted on 12/25/2018 with right hip fracture.   1.  Acute kidney injury, chronic kidney disease stage III.  Baseline 1.37/GFR 36 from December 15, 2018 Acute renal failure may be secondary to vancomycin usage. Previously required renal replacement therapy.  -Patient continues to have low renal function.  BUN 62 with a creatinine of 2.91.  Patient with very little muscle mass.  EGFR currently 15.  Patient's family now agreeable to hemodialysis.  Vascular access will be needed, however INR noted to be high at 6.5.  She will likely receive FFP as directed by hospitalist and vascular surgery.  We will plan for short dialysis treatment once we have access.  2.  Anemia of chronic kidney disease.  Hemoglobin continues to drift down.  Hemoglobin now 8.2.  Continue to monitor closely.  3.  Chronic systolic congestive heart failure with EF 25%.  Continue Lasix drip for now.  4.  Pulmonary hypertension with respiratory failure: Patient currently on nasal cannula.  5.  Mechanical heart valve requiring chronic anticoagulation.  INR quite high today at 6.5.  Patient to receive FFP.   LOS: 25 Rimsha Trembley 8/14/202011:58 AM

## 2019-02-12 NOTE — Progress Notes (Signed)
CRITICAL VALUE ALERT  Critical Value: INR  6.48 Date & Time Notied:  8/14. 0838 Provider Notified: Dr. Dustin Flock  Orders Received/Actions taken: Not at this time

## 2019-02-12 NOTE — Progress Notes (Signed)
Progress Note  Patient Name: [name] Date of Encounter: 02/12/2019  Primary Cardiologist: Ida Rogue, MD   Subjective   No new issues overnight except for sacral decub pain, nausea Difficulty positioning in bed to get comfortable Try to eat some breakfast but had nausea had to stop eating Denies significant shortness of breath, still very lethargic per the family -Patient did reports having tachycardia overnight but on review of telemetry heart rate stable in the 60s with no arrhythmia, paced rhythm   Inpatient Medications    Scheduled Meds: . acetaminophen  650 mg Oral Once  . acidophilus  1 capsule Oral Daily  . amiodarone  200 mg Oral BID  . collagenase   Topical Daily  . mouth rinse  15 mL Mouth Rinse BID  . multivitamin-lutein  1 capsule Oral Daily  . pantoprazole  40 mg Oral Daily  . sodium chloride flush  3 mL Intravenous Q12H  . sodium chloride flush  3 mL Intravenous Q12H  . vitamin C  500 mg Oral Daily  . Warfarin - Pharmacist Dosing Inpatient   Does not apply q1800   Continuous Infusions: . sodium chloride    . furosemide (LASIX) infusion 10 mg/hr (02/12/19 0543)   PRN Meds: acetaminophen, acetaminophen, calcium carbonate, ipratropium-albuterol, liver oil-zinc oxide, Melatonin, menthol-cetylpyridinium **OR** phenol, metoCLOPramide **OR** metoCLOPramide (REGLAN) injection, nystatin, ondansetron **OR** ondansetron (ZOFRAN) IV, oxyCODONE, sodium chloride flush, traMADol   Vital Signs    Vitals:   02/02/2019 1522 02/14/2019 2100 02/12/19 0542 02/12/19 0738  BP:  116/70 108/60 (!) 110/57  Pulse:  60 63 62  Resp:  20 18 16   Temp: 97.6 F (36.4 C) 98 F (36.7 C) 98.3 F (36.8 C) 97.8 F (36.6 C)  TempSrc:  Axillary Axillary   SpO2:  93% 94% 98%  Weight:   54.4 kg   Height:        Intake/Output Summary (Last 24 hours) at 02/12/2019 1120 Last data filed at 02/12/2019 0543 Gross per 24 hour  Intake 568.47 ml  Output 600 ml  Net -31.53 ml    Last 3 Weights 02/12/2019 02/10/2019 02/09/2019  Weight (lbs) 120 lb 115 lb (No Data)  Weight (kg) 54.432 kg 52.164 kg (No Data)  Some encounter information is confidential and restricted. Go to Review Flowsheets activity to see all data.      Telemetry    Telemetry showing paced rhythm in the 60s personally Reviewed  ECG    No new tracing.  Physical Exam    Constitutional: Lethargic but arousable, may negative went away, critically ill appearing HENT:  Head: Grossly normal Eyes:  no discharge. No scleral icterus.  Neck: Unable to estimate JVD, no carotid bruits  Cardiovascular: Regular rate and rhythm, 2-3/6 SEM LSB  appreciated Pulmonary/Chest: Clear to auscultation bilaterally, poor inspiratory effort, scattered Rales Abdominal: Soft.  no distension.  no tenderness.  Musculoskeletal: Normal range of motion Neurological:   Not fully tested, diffuse body atrophy Skin: Skin warm and dry Psychiatric: Lethargic   Labs    High Sensitivity Troponin:  No results for input(s): TROPONINIHS in the last 720 hours.    Cardiac EnzymesNo results for input(s): TROPONINI in the last 168 hours. No results for input(s): TROPIPOC in the last 168 hours.   Chemistry Recent Labs  Lab 02/10/19 0613 01/31/2019 0547 02/12/19 0730  NA 135 131* 129*  K 4.1 4.2 4.3  CL 94* 91* 91*  CO2 25 25 25   GLUCOSE 88 88 77  BUN 58*  64* 62*  CREATININE 2.84* 3.00* 2.91*  CALCIUM 8.6* 8.2* 8.0*  GFRNONAA 15* 14* 15*  GFRAA 17* 16* 17*  ANIONGAP 16* 15 13     Hematology Recent Labs  Lab 02/10/19 0613 02/22/2019 0547 02/12/19 0729  WBC 8.8 8.1 8.7  RBC 3.43* 3.19* 2.99*  HGB 9.8* 9.0* 8.2*  HCT 32.6* 30.6* 27.9*  MCV 95.0 95.9 93.3  MCH 28.6 28.2 27.4  MCHC 30.1 29.4* 29.4*  RDW 18.6* 18.3* 18.1*  PLT 270 242 218    BNP No results for input(s): BNP, PROBNP in the last 168 hours.   DDimer No results for input(s): DDIMER in the last 168 hours.   Radiology    Dg Sacrum/coccyx  Result  Date: 02/23/2019 CLINICAL DATA:  Evaluate for sacral/decubitus ulcer EXAM: SACRUM AND COCCYX - 2+ VIEW COMPARISON:  None. FINDINGS: Markedly suboptimal evaluation of the sacrum and coccyx given extensive overlying bowel gas, coarse uterine fibroids, and poor penetration on lateral radiograph due to body habitus. Prior vertebral and sacral augmentations are noted. No acute fracture is evident. Extensive vascular calcium is noted in the pelvis. IMPRESSION: Suboptimal assessment of the sacrum and coccyx due to difficulties with patient positioning and body habitus. Consider contrast-enhanced CT imaging for further evaluation. Electronically Signed   By: Lovena Le M.D.   On: 02/24/2019 19:40    Cardiac Studies   TTE 01/06/2019 [facility]): LVEF ~45%.  Mild RV dysfunction.  Trivial PR and moderate TR.  Moderate aortic stenosis.  Mechanical mitral valve present.  Pulmonary hypertension (RVSP 74 mmHg).  TTE 11/05/2018 1. The left ventricle has severely reduced systolic function, with an ejection fraction of 25-30%. The cavity size was mildly dilated. Left ventricular diastolic Doppler parameters are consistent with impaired relaxation. Left ventrical global  hypokinesis without regional wall motion abnormalities. 2. The right ventricle has mildly reduced systolic function. The cavity was moderately enlarged. There is no increase in right ventricular wall thickness. Right ventricular systolic pressure is moderately elevated with an estimated pressure of 53 mmHg. 3. Right atrial size was moderately dilated. 4. Left atrial size was moderately dilated. 5. The aortic valve was not well visualized. Moderate calcification of the aortic valve. Aortic valve regurgitation was not assessed by color flow Doppler. Unable to estimate adegree of aortic valve stenosis given severely reduced systolic function. 6. Mechanical mitral valve. Mean gradient 5 mm Hg. 7. Tricuspid valve regurgitation is moderate. 8. Left  pleural effusion noted, 6.5 cm 9. Rhythm is atrial tachycardia, rate 105 bpm  Patient Profile     84 y.o. female with a hx of atrial fibrillation, chronic combined systolic and diastolic congestive heart failure, nonischemic cardiomyopathy status post CRT-P(11/2017), left bundle branch block, hypertension, chronic lower extremity edema, mitral valve disease status post mechanical mitral valve replacement on chronic Coumadin, s/p spontaneous left calf hematoma requiring debridement, stage III chronic kidney disease,11/2018 mechanical fall with subsequent R hip fracture and later R hip surgery ([facility]),and seen for management and optimization ofcardiac medications in the setting of the above complex medical and cardiac conditions and s/pright-sided intertrochanteric femur fracture and repair at [facility].  Assessment & Plan    Atrial tachycardia: She had cardioversion 11/06/2018 for persistent atrial tachycardia at which time she had escalating diuretic regiment with worsening renal failure. After restoring normal sinus rhythm had better urine output, improved renal function -Long discussion with daughter yesterday, and today Family would like to pursue cardioversion to restore normal sinus rhythm in effort to facilitate diuresis, improve her shortness of  breath, renal function -After discussion the risk and benefit of the procedure,  Daughter and patient willing to proceed,  Family is declining beta-blockers Will continue amiodarone at current doses  Atrial fibrillation, paroxysmal No recent episodes On warfarin On amiodarone, continue at current dose Family does not want beta-blockers  Acute on chronic systolic and diastolic heart failure: Has not responded to Lasix infusion given underlying renal dysfunction, cardiomyopathy No dramatic increase in urine output with restoring normal sinus rhythm -Long discussion with family, nephrology It is felt that some of her uremia could be contributing to  lethargy and mental status changes No quick turnaround with using Lasix infusion, decision made to do a trial of hemodialysis today through the weekend to see if this improves her underlying condition  Mechanical mitral valve: Supratherapeutic, possibly from antibiotics For INR 6.5 would recommend FFP 2 units prior to placement of femoral catheter for HD  Anemia: Hemoglobin up to 9.0    thigh hematoma with wound VAC in place Stable  Sacral decub ulcer On tramadol as needed for pain, repositiooning in bed  UTI Foley changed, ID following Could repeat cultures with new foley in place  Discussed at length with family son and daughter,  Discussed challenges with diuresis, long discussion whether we should proceed with hemodialysis, and in the end it was family's decision to pursue dialysis and they were aware of risk and benefit Discussed with vascular surgery, FFP will be given  Total encounter time more than 35 minutes  Greater than 50% was spent in counseling and coordination of care with the patient   For questions or updates, please contact [facility] Please consult [email or web address] for contact info under [facility].     Signed, Ida Rogue, MD  02/12/2019, 11:20 AM

## 2019-02-12 NOTE — Progress Notes (Signed)
ID Daughter at bed side Pt in bed lying with eyes closed but responds to questions appropriately but confused intermittently  MeDs tyelonl ( received today) Tramadol 50mg  - Received 2 today Lasix IV 10mg /Hr Amiodarone 200mg  PO BID PPI probiotic    Temp curve this hospitalization   Patient Vitals for the past 24 hrs:  BP Temp Temp src Pulse Resp SpO2 Weight  02/12/19 0738 (!) 110/57 97.8 F (36.6 C) - 62 16 98 % -  02/12/19 0542 108/60 98.3 F (36.8 C) Axillary 63 18 94 % 54.4 kg  02/06/2019 2100 116/70 98 F (36.7 C) Axillary 60 20 93 % -  02/07/2019 1522 - 97.6 F (36.4 C) - - - - -  03/01/2019 1513 102/73 (!) 96.1 F (35.6 C) Axillary 60 18 91 % -    O/E frail Lying with eyes closed- responds to questions but weak and intermittently confused Hs s1s2 Rt lateral thigh area- wound vac with bloody fluid Sacral decub picture reviewed Stage IV- wound clean- surrounding skin is moist and erythematous   B/l leg dressing- left leg has the graft and wound healing well Heel pressure areas -has bunny boots   CBC Latest Ref Rng & Units 02/12/2019 02/10/2019 02/10/2019  WBC 4.0 - 10.5 K/uL 8.7 8.1 8.8  Hemoglobin 12.0 - 15.0 g/dL 8.2(L) 9.0(L) 9.8(L)  Hematocrit 36.0 - 46.0 % 27.9(L) 30.6(L) 32.6(L)  Platelets 150 - 400 K/uL 218 242 270    CMP Latest Ref Rng & Units 02/12/2019 02/24/2019 02/10/2019  Glucose 70 - 99 mg/dL 77 88 88  BUN 8 - 23 mg/dL 62(H) 64(H) 58(H)  Creatinine 0.44 - 1.00 mg/dL 2.91(H) 3.00(H) 2.84(H)  Sodium 135 - 145 mmol/L 129(L) 131(L) 135  Potassium 3.5 - 5.1 mmol/L 4.3 4.2 4.1  Chloride 98 - 111 mmol/L 91(L) 91(L) 94(L)  CO2 22 - 32 mmol/L 25 25 25   Calcium 8.9 - 10.3 mg/dL 8.0(L) 8.2(L) 8.6(L)  Total Protein 6.5 - 8.1 g/dL - - -  Total Bilirubin 0.3 - 1.2 mg/dL - - -  Alkaline Phos 38 - 126 U/L - - -  AST 15 - 41 U/L - - -  ALT 0 - 44 U/L - - -    BC-03/01/2019 NG so far  8/2 urine enterococcus  8/13 UA - wbc, yeast, calcium oxalate crystals, no  bacteria  Impression/recommendation 84 year old female with multiple comorbidities and prolonged hospitalization and declining health in the last 3 months.  Metabolic encephalopathy with confusion.  Multiple etiologies.  The worsening creatinine and declining renal function and pain medication like tramadol likely contributing to it because of clearance issue.  There was a concern for urinary tract infection with Enterococcus faecalis which was cultured on 02/01/2019.  She has been adequately treated with vancomycin and then fosfomycin. CO2 narcosis is also on the mix but family does not want any ABG as patient had refused CPAP in the past.   Foley catheter has been replaced on 02/16/2019 and repeat urine analysis does not show any bacteria.  There is pyuria but that is related to the chronic Foley.  We do not need to send a culture or treat this.   Sacral decubitus ulcer debridement done at bedside on 02/04/2019.  Getting Santyl.  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  There is surrounding moistness and erythema of the skin which is likely yeast infection.  So we will treat with nystatin cream topically  Atrial fibrillation status post cardioversion and now in sinus rhythm.  On amiodarone.Marland Kitchen  Mitral valve replacement.  She is on Coumadin Her INR is 6.5 and she is getting fresh frozen plasma because she has to get a catheter for dialysis.  CHF EF of 25 to 30% in May 2020 and right heart pressure of 53 mmHg  AKI on CKD.  Urine output 600 yesterday on IV Lasix.  Because of her mentation and decreasing urine output nephrology planning short-term dialysis   Right femur fracture status post ORIF Hematoma at the site and now she has a wound VAC.  History of spontaneous hematoma left leg for which she underwent surgery and graft placement during last admission.  Wound healing well  Patient is high risk for infection especially as she is bedbound with multiple possible sources including sacral decub, aspiration and  line related infections .  Currently blood cultures drawn on 02/10/2019 are negative, there is no leukocytosis, no fever .so we need to observe her closely.  We will avoid empiric antibiotics.  Discussed the management in great detail with her daughter. Also discussed with surgeon, her cardiologist and nephrologist.  ID will follow her peripherally this weekend call if needed.

## 2019-02-12 NOTE — Consult Note (Addendum)
[place] for Warfarin Dosing  Indication: Mitral Valve Replacement, A. Fib    Allergies  Allergen Reactions  . Penicillins Rash    ** daughter reports facial swelling**   Has patient had a PCN reaction causing immediate rash, facial/tongue/throat swelling, SOB or lightheadedness with hypotension: YES Has patient had a PCN reaction causing severe rash involving mucus membranes or skin necrosis: NO Has patient had a PCN reaction that required hospitalization NO Has patient had a PCN reaction occurring within the last 10 years: NO If all of the above answers are "NO", then may proceed with Cephalosporin use.  . Ace Inhibitors     Cough  . Doxycycline Nausea Only  . Hydrocodone Itching and Nausea Only  . Mercury     Other reaction(s): Unknown  . Silver Dermatitis    Severe itching  . Cephalexin Rash  . Clindamycin/Lincomycin Rash  . Nickel Rash    Patient Measurements: Height: 4\' 11"  (149.9 cm) Weight: 120 lb (54.4 kg) IBW/kg (Calculated) : 43.2   Vital Signs: Temp: 97.8 F (36.6 C) (08/14 0738) Temp Source: Axillary (08/14 0542) BP: 110/57 (08/14 0738) Pulse Rate: 62 (08/14 0738)  Labs: Recent Labs    02/10/19 0613 02/10/2019 0547 02/12/19 0729  HGB 9.8* 9.0* 8.2*  HCT 32.6* 30.6* 27.9*  PLT 270 242 218  LABPROT 31.9* 43.7*  --   INR 3.2* 4.7*  --   CREATININE 2.84* 3.00*  --     Estimated Creatinine Clearance: 11.1 mL/min (A) (by C-G formula based on SCr of 3 mg/dL (H)).   Medical History: Past Medical History:  Diagnosis Date  . Arthritis    "back, hands, knees" (04/10/2016)  . Chronic back pain   . 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 EF 25% by echo in 08/2015 b. RHC in 08/2015 showed normal filling pressures; c. 10/2018 Echo (in setting of atrial tachycardia): EF 25-30%, impaired relaxation. Glob HK. RVSP 23mmHg. Mod dil RA/LA. Mech MV w/ nl fxn (mean grad 68mmHg).  Mod TR.   Marland Kitchen Chronic kidney disease   . Compression fracture    "several; all in my back" (04/10/2016)  . GERD (gastroesophageal reflux disease)   . Hypertension   . Lymphedema   . Pacemaker 05/2017  . PAF (paroxysmal atrial fibrillation) ([place])    a. On amio & Coumadin - CHA2DS2VASc = 4.  . S/P MVR (mitral valve replacement)    a. MVR 1994 b. redo MVR in 08/2014 - on Coumadin  . Shortness of breath dyspnea    Assessment: Pharmacy consulted for warfarin dosing and monitoring for 84 yo female with PMH of mitral valve replacement and A. Fib. Patient transferred to [facility] 7/20 PM from [place]. Patient's INR is sensitive to changes. We have been giving her a lower dose from her home dose. Patient may need to be discharged on 2 mg daily.   Home dose: warfarin 2.5 mg daily.   DDI: on amiodarone (increase INR); levofloxacin 8/10 started (may significantly increase INR)  Spoke to MD on 7/22 regarding low Hgb- okay with continuing warfarin at lower dose.  Today's INR is supratherapeutic. However, the INR significantly increased likely due to recent boosted doses, a dose of levofloxacin on 8/10, and CHF (fluid changes). Will need to HOLD dose today. Confirmed with nurse, no noted bleeding. Patient had DCCV today and is now NSR. Additionally, patient received a new foley catheter.   DATE INR DOSE 7/23  3.0 1 mg - did not receive.  7/24     2.7       2 mg  7/25 2.4 2 mg 7/26 2.2 2.5 mg 7/27     1.9       4 mg 7/28     1.9       3 mg  7/29 2.3 2.5 mg 7/30     2.9       2 mg 7/31     3.1       2 mg  8/1       4.0       Hold  8/2       3.3       1 mg 8/3 2.7 1 mg 8/4 2.5 1.5 mg - missed this dose 8/5 2.2 1.5 mg  8/6 2.2 2 mg  8/7 2.1 2.5 mg 8/8 2.1 2.5 mg 8/9 2.1 3 mg 8/10 2.1 2.5 mg  8/11 2.8  2 mg  8/12 3.2 1 mg 8/13 4.7 HELD 8/14 6.5        ------      Goal of Therapy:  INR 2.5-3.5 (ideally want INR between 2.5 to 3 per previous notes)- --Addendum 02/08/2019 @1350 . Discussed  with Marrianne Mood, PA-C about INR goal of 2-3. Given h/o hematoma will use an INR goal of 2-3; per Dr.Arida preference for 2.5-3.   Monitor platelets by anticoagulation protocol: Yes   Plan:  INR level is supratherapeutic. Will HOLD warfarin tonight x 1 dose. Hgb 8.2  Plt 218 She will likely receive FFP as directed by hospitalist and vascular surgery Daily INR ordered. Pharmacy will continue to follow and order warfarin based on INRs  Rowland Lathe, PharmD 02/12/2019 8:59 AM

## 2019-02-12 NOTE — Progress Notes (Signed)
PT Cancellation Note  Patient Details Name: [name] MRN: [id number] DOB: 1934-09-16   Cancelled Treatment:    Reason Eval/Treat Not Completed: Medical issues which prohibited therapy(Chart reviewed, pt with elevated INR this AM (6.5). Per PT practice guidelines, PT to hold re-evaluation until patient is more appropriate for exertional activity.)   Lieutenant Diego PT, DPT 9:56 AM,02/12/19 [phone]

## 2019-02-12 NOTE — Progress Notes (Signed)
[facility] SURGICAL PROGRESS NOTE (cpt [phone])  Hospital Day(s): 25.   Post op day(s): 1 Day Post-Op.   Interval History: Patient seen and examined, no acute events or new complaints overnight. Patient reports continued pain at her sacrum. Underwent XR of the area to evaluate this for additional etiologies however that examination was not diagnostic.   Review of Systems:  Constitutional: denies fever, chills  Integumentary: + Sacral pain  Vital signs in last 24 hours: [min-max] current  Temp:  [96.1 F (35.6 C)-98.3 F (36.8 C)] 97.8 F (36.6 C) (08/14 0738) Pulse Rate:  [58-63] 62 (08/14 0738) Resp:  [16-20] 16 (08/14 0738) BP: (93-116)/(57-73) 110/57 (08/14 0738) SpO2:  [76 %-99 %] 98 % (08/14 0738) Weight:  [54.4 kg] 54.4 kg (08/14 0542)     Height: 4\' 11"  (149.9 cm) Weight: 54.4 kg BMI (Calculated): 24.22   Intake/Output last 2 shifts:  08/13 0701 - 08/14 0700 In: 668.5 [P.O.:200; I.V.:468.5] Out: 900 [Urine:600; Drains:300]   Physical Exam:  Constitutional: alert, cooperative and no distress  HENT: normocephalic without obvious abnormality  Respiratory: breathing non-labored at rest  Integumentary: 3x3 cm sacral pressure injury, red granulation tissue with minimal fibrinous tissue, coccyx palpable but not exposed.   Sacral Ulcer (02/12/2019):       Labs:  CBC Latest Ref Rng & Units 02/12/2019 02/01/2019 02/10/2019  WBC 4.0 - 10.5 K/uL 8.7 8.1 8.8  Hemoglobin 12.0 - 15.0 g/dL 8.2(L) 9.0(L) 9.8(L)  Hematocrit 36.0 - 46.0 % 27.9(L) 30.6(L) 32.6(L)  Platelets 150 - 400 K/uL 218 242 270   CMP Latest Ref Rng & Units 03/01/2019 02/10/2019 02/09/2019  Glucose 70 - 99 mg/dL 88 88 97  BUN 8 - 23 mg/dL 64(H) 58(H) 51(H)  Creatinine 0.44 - 1.00 mg/dL 3.00(H) 2.84(H) 2.69(H)  Sodium 135 - 145 mmol/L 131(L) 135 131(L)  Potassium 3.5 - 5.1 mmol/L 4.2 4.1 4.0  Chloride 98 - 111 mmol/L 91(L) 94(L) 91(L)  CO2 22 - 32 mmol/L 25 25 26   Calcium 8.9 - 10.3 mg/dL  8.2(L) 8.6(L) 8.7(L)  Total Protein 6.5 - 8.1 g/dL - - -  Total Bilirubin 0.3 - 1.2 mg/dL - - -  Alkaline Phos 38 - 126 U/L - - -  AST 15 - 41 U/L - - -  ALT 0 - 44 U/L - - -     Imaging studies:   XR Sacrum/Coccyx (02/10/2019) personally reviewed with Dr Hampton Abbot which is difficult to appreciate the sacrum or coccyx, and radiologist report reviewed:  IMPRESSION: Suboptimal assessment of the sacrum and coccyx due to difficulties with patient positioning and body habitus. Consider contrast-enhanced CT imaging for further evaluation.   Assessment/Plan: (ICD-10's: L69.153) 84 y.o. female with sacral pressure injury currently managed with local wound care   - Will discuss with Dr Marla Roe to determine if there are any feasible alternatives to local wound care this time   - Sacral XR on 02/20/2019 was not diagnostic    - No decision made regarding possibility of wound vac per family   - Continue current dressng changes as scheduled              - Continue local wound care; frequent repositioning; santyl dressing changes daily - optimize from nutritional standpoint - mobilization as tolerated given recent ORIF; working with PT - Further management per medical team   All of the above findings and recommendations were discussed with the patient, patient's family (will check in later when family at bedside), and the medical  team.  -- Edison Simon, PA-C [facility] 02/12/2019, 8:59 AM [phone] M-F: 7am - 4pm

## 2019-02-12 NOTE — Consult Note (Signed)
[place] Nurse wound follow up Wound type: Deep tissue pressure injury to right posterior heel noted on 02/10/19. Silicone foams to both heels and floatation in place. Will add Prevalon Boots. Measurement: 0.4cm x 1.5cm flat (deflated) blister with purple stain beneath skin. Consistent with deep tissue pressure injury. Wound bed:As described above Drainage (amount, consistency, odor) None Periwound:intact, dry Dressing procedure/placement/frequency: There is a silicone dressing in place and heels are floated bilaterally. I will add Prevalon Boots.  Surgery is following for the coccyx Stage 4 pressure injury and has seen today.  Orthopedics is following the hip wound. Plastic Surgery involved for LE wounds. Infectious disease saw yesterday for evaluation of infectious processes.  Discussed POC with Nurse at bedside.  Family not in room.   [place] nursing team will follow, and see every 7-10 days.  We will remain available to this patient, the nursing, surgical and medical teams.  Thank you, Maudie Flakes, MSN, RN, Chancy Milroy, Arther Abbott  Pager# [phone]

## 2019-02-12 NOTE — Progress Notes (Signed)
OT Cancellation Note  Patient Details Name: [name] MRN: [id number] DOB: 1935-06-26   Cancelled Treatment:    Reason Eval/Treat Not Completed: Medical issues which prohibited therapy. Chart reviewed, pt with elevated INR this AM (6.5). Per OT practice guidelines, OT to hold re-evaluation until patient is more medically appropriate for exertional activity.  Jeni Salles, MPH, MS, OTR/L ascom [phone] 02/12/19, 9:58 AM

## 2019-02-13 ENCOUNTER — Inpatient Hospital Stay: Payer: Medicare Other

## 2019-02-13 LAB — CBC
HCT: 29.2 % — ABNORMAL LOW (ref 36.0–46.0)
HCT: 29.1 % — ABNORMAL LOW (ref 36.0–46.0)
Hemoglobin: 8.7 g/dL — ABNORMAL LOW (ref 12.0–15.0)
Hemoglobin: 8.6 g/dL — ABNORMAL LOW (ref 12.0–15.0)
MCH: 27.6 pg (ref 26.0–34.0)
MCH: 27.3 pg (ref 26.0–34.0)
MCHC: 29.8 g/dL — ABNORMAL LOW (ref 30.0–36.0)
MCHC: 29.6 g/dL — ABNORMAL LOW (ref 30.0–36.0)
MCV: 92.7 fL (ref 80.0–100.0)
MCV: 92.4 fL (ref 80.0–100.0)
Platelets: 218 10*3/uL (ref 150–400)
Platelets: 203 10*3/uL (ref 150–400)
RBC: 3.15 MIL/uL — ABNORMAL LOW (ref 3.87–5.11)
RBC: 3.15 MIL/uL — ABNORMAL LOW (ref 3.87–5.11)
RDW: 18.3 % — ABNORMAL HIGH (ref 11.5–15.5)
RDW: 18.4 % — ABNORMAL HIGH (ref 11.5–15.5)
WBC: 9.7 10*3/uL (ref 4.0–10.5)
WBC: 9.1 10*3/uL (ref 4.0–10.5)
nRBC: 0.3 % — ABNORMAL HIGH (ref 0.0–0.2)
nRBC: 0.4 % — ABNORMAL HIGH (ref 0.0–0.2)

## 2019-02-13 LAB — BASIC METABOLIC PANEL
Anion gap: 12 (ref 5–15)
Anion gap: 14 (ref 5–15)
BUN: 70 mg/dL — ABNORMAL HIGH (ref 8–23)
BUN: 72 mg/dL — ABNORMAL HIGH (ref 8–23)
CO2: 26 mmol/L (ref 22–32)
CO2: 23 mmol/L (ref 22–32)
Calcium: 8.2 mg/dL — ABNORMAL LOW (ref 8.9–10.3)
Calcium: 8.3 mg/dL — ABNORMAL LOW (ref 8.9–10.3)
Chloride: 88 mmol/L — ABNORMAL LOW (ref 98–111)
Chloride: 87 mmol/L — ABNORMAL LOW (ref 98–111)
Creatinine, Ser: 3.29 mg/dL — ABNORMAL HIGH (ref 0.44–1.00)
Creatinine, Ser: 3.31 mg/dL — ABNORMAL HIGH (ref 0.44–1.00)
GFR calc Af Amer: 15 mL/min — ABNORMAL LOW (ref >60)
GFR calc Af Amer: 14 mL/min — ABNORMAL LOW (ref >60)
GFR calc non Af Amer: 13 mL/min — ABNORMAL LOW (ref >60)
GFR calc non Af Amer: 12 mL/min — ABNORMAL LOW (ref >60)
Glucose, Bld: 93 mg/dL (ref 70–99)
Glucose, Bld: 106 mg/dL — ABNORMAL HIGH (ref 70–99)
Potassium: 4.4 mmol/L (ref 3.5–5.1)
Potassium: 4.6 mmol/L (ref 3.5–5.1)
Sodium: 126 mmol/L — ABNORMAL LOW (ref 135–145)
Sodium: 124 mmol/L — ABNORMAL LOW (ref 135–145)

## 2019-02-13 LAB — BPAM FFP
Blood Product Expiration Date: 202008192359
Blood Product Expiration Date: 202008192359
ISSUE DATE / TIME: 202008141419
ISSUE DATE / TIME: 202008141708
Unit Type and Rh: 600
Unit Type and Rh: 6200

## 2019-02-13 LAB — PREPARE FRESH FROZEN PLASMA
Unit division: 0
Unit division: 0

## 2019-02-13 LAB — PROTIME-INR
INR: 4.2 (ref 0.8–1.2)
INR: 4.7 (ref 0.8–1.2)
Prothrombin Time: 39.5 seconds — ABNORMAL HIGH (ref 11.4–15.2)
Prothrombin Time: 43.5 seconds — ABNORMAL HIGH (ref 11.4–15.2)

## 2019-02-13 LAB — BRAIN NATRIURETIC PEPTIDE: B Natriuretic Peptide: 3063 pg/mL — ABNORMAL HIGH (ref 0.0–100.0)

## 2019-02-13 MED ORDER — FUROSEMIDE 10 MG/ML IJ SOLN
40.0000 mg | Freq: Once | INTRAMUSCULAR | Status: DC
  Filled 2019-02-13: qty 4

## 2019-02-13 MED ORDER — FUROSEMIDE BOLUS VIA INFUSION: 40.0000 mg | Freq: Once | INTRAVENOUS | Status: DC

## 2019-02-13 MED ORDER — SODIUM CHLORIDE 0.9% IV SOLUTION
Freq: Once | INTRAVENOUS | Status: AC
  Administered 2019-02-13: via INTRAVENOUS

## 2019-02-13 MED ORDER — FUROSEMIDE 10 MG/ML IJ SOLN: 20.0000 mg | Freq: Once | INTRAMUSCULAR | Status: DC

## 2019-02-13 MED ORDER — FUROSEMIDE 10 MG/ML IJ SOLN
20.0000 mg | INTRAMUSCULAR | Status: AC
  Administered 2019-02-13: 20 mg via INTRAVENOUS
  Filled 2019-02-13: qty 2

## 2019-02-13 NOTE — Progress Notes (Signed)
[place] for Warfarin Dosing  Indication: Mitral Valve Replacement, A. Fib    Allergies  Allergen Reactions  . Penicillins Rash    ** daughter reports facial swelling**   Has patient had a PCN reaction causing immediate rash, facial/tongue/throat swelling, SOB or lightheadedness with hypotension: YES Has patient had a PCN reaction causing severe rash involving mucus membranes or skin necrosis: NO Has patient had a PCN reaction that required hospitalization NO Has patient had a PCN reaction occurring within the last 10 years: NO If all of the above answers are "NO", then may proceed with Cephalosporin use.  . Ace Inhibitors     Cough  . Doxycycline Nausea Only  . Hydrocodone Itching and Nausea Only  . Mercury     Other reaction(s): Unknown  . Silver Dermatitis    Severe itching  . Cephalexin Rash  . Clindamycin/Lincomycin Rash  . Nickel Rash    Patient Measurements: Height: 4\' 11"  (149.9 cm) Weight: 120 lb (54.4 kg) IBW/kg (Calculated) : 43.2   Vital Signs: Temp: 97.5 F (36.4 C) (08/15 0718) Temp Source: Axillary (08/15 0718) BP: 100/46 (08/15 0718) Pulse Rate: 61 (08/15 0718)  Labs: Recent Labs    02/24/2019 0547 02/12/19 0729 02/12/19 0730 02/13/19 1109 02/13/19 1249 02/13/19 1303  HGB 9.0* 8.2*  --  8.7*  --   --   HCT 30.6* 27.9*  --  29.2*  --   --   PLT 242 218  --  218  --   --   LABPROT 43.7* 55.8*  --   --  39.5*  --   INR 4.7* 6.5*  --   --  4.2*  --   CREATININE 3.00*  --  2.91*  --   --  3.29*    Estimated Creatinine Clearance: 10.1 mL/min (A) (by C-G formula based on SCr of 3.29 mg/dL (H)).   Medical History: Past Medical History:  Diagnosis Date  . Arthritis    "back, hands, knees" (04/10/2016)  . Chronic back pain   . 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 EF 25% by echo in 08/2015 b. RHC in 08/2015 showed normal filling pressures; c. 10/2018 Echo  (in setting of atrial tachycardia): EF 25-30%, impaired relaxation. Glob HK. RVSP 52mmHg. Mod dil RA/LA. Mech MV w/ nl fxn (mean grad 49mmHg). Mod TR.   Marland Kitchen Chronic kidney disease   . Compression fracture    "several; all in my back" (04/10/2016)  . GERD (gastroesophageal reflux disease)   . Hypertension   . Lymphedema   . Pacemaker 05/2017  . PAF (paroxysmal atrial fibrillation) ([place])    a. On amio & Coumadin - CHA2DS2VASc = 4.  . S/P MVR (mitral valve replacement)    a. MVR 1994 b. redo MVR in 08/2014 - on Coumadin  . Shortness of breath dyspnea    Assessment: Pharmacy consulted for warfarin dosing and monitoring for 84 yo female with PMH of mitral valve replacement and A. Fib. Patient transferred to [facility] 7/20 PM from [place]. Patient's INR is sensitive to changes. We have been giving her a lower dose from her home dose. Patient may need to be discharged on 2 mg daily.   Home dose: warfarin 2.5 mg daily.   DDI: on amiodarone (increase INR); levofloxacin 8/10 started (may significantly increase INR)  Spoke to MD on 7/22 regarding low Hgb- okay with continuing warfarin at lower dose.  Today's INR is supratherapeutic.  However, the INR significantly increased likely due to recent boosted doses, a dose of levofloxacin on 8/10, and CHF (fluid changes). Will need to HOLD dose today. Confirmed with nurse, no noted bleeding. Patient had DCCV today and is now NSR. Additionally, patient received a new foley catheter.   DATE INR DOSE 7/23 3.0 1 mg - did not receive.  7/24     2.7       2 mg  7/25 2.4 2 mg 7/26 2.2 2.5 mg 7/27     1.9       4 mg 7/28     1.9       3 mg  7/29 2.3 2.5 mg 7/30     2.9       2 mg 7/31     3.1       2 mg  8/1       4.0       Hold  8/2       3.3       1 mg 8/3 2.7 1 mg 8/4 2.5 1.5 mg - missed this dose 8/5 2.2 1.5 mg  8/6 2.2 2 mg  8/7 2.1 2.5 mg 8/8 2.1 2.5 mg 8/9 2.1 3 mg 8/10 2.1 2.5 mg  8/11 2.8  2 mg  8/12 3.2 1 mg 8/13 4.7 HELD 8/14 6.5         ------ 8/15  4.2       Goal of Therapy:  INR 2.5-3.5 (ideally want INR between 2.5 to 3 per previous notes)- --Addendum 02/08/2019 @1350 . Discussed with Marrianne Mood, PA-C about INR goal of 2-3. Given h/o hematoma will use an INR goal of 2-3; per Dr.Arida preference for 2.5-3.   Monitor platelets by anticoagulation protocol: Yes   Plan:  INR level is supratherapeutic. Will HOLD warfarin tonight x 1 dose. Hgb 8.7  Plt 218 is stable. Follow up INR in AM.   Pharmacy will continue to follow and order warfarin based on INRs  Rocky Morel, PharmD 02/13/2019 2:23 PM

## 2019-02-13 NOTE — Progress Notes (Signed)
Port to inflate the foley ballon, was positioned in a downward position and caused a small 0.5 x 0.5 cm skin tear.  I have been repositioning this port all day.  Repositioning the tubing in the stat lock and twisting the tubing below the port so reposition the port in an upward position.  The port did not remain the that position.  Tear cleansed.  Thick gauze wrapped around the port and taped in place.

## 2019-02-13 NOTE — Progress Notes (Signed)
Called into her room, she's c/o being SOB.  Sats on 3.5 L 88%.  Oxygen increased to 4L.  Sats improveded to 95%  Lungs clear/dimminished in upper lobes and left lower.  Fine crackles in left lower.

## 2019-02-13 NOTE — Progress Notes (Signed)
Progress Note  Patient Name: [name] Date of Encounter: 02/13/2019  Primary Cardiologist: Ida Rogue, MD   Subjective   No chest pain or shortness of breath.  The patient is uncomfortable in bed and she wants to get up and walk.  Her daughter is at the bedside.  Notes reviewed from Dr. Rockey Situ.  Biggest issue today is that the patient needs a dialysis catheter placed and her INR was supratherapeutic.  She received FFP and she has a very difficult stick so no one has been able to get a blood draw to recheck her INR.  Inpatient Medications    Scheduled Meds: . acetaminophen  650 mg Oral Once  . acidophilus  1 capsule Oral Daily  . amiodarone  200 mg Oral BID  . Chlorhexidine Gluconate Cloth  6 each Topical Q0600  . collagenase   Topical Daily  . mouth rinse  15 mL Mouth Rinse BID  . multivitamin-lutein  1 capsule Oral Daily  . nystatin cream   Topical BID  . pantoprazole  40 mg Oral Daily  . sodium chloride flush  3 mL Intravenous Q12H  . sodium chloride flush  3 mL Intravenous Q12H  . vitamin C  500 mg Oral Daily  . Warfarin - Pharmacist Dosing Inpatient   Does not apply q1800   Continuous Infusions: . sodium chloride    . furosemide (LASIX) infusion 10 mg/hr (02/13/19 0324)   PRN Meds: acetaminophen, acetaminophen, calcium carbonate, ipratropium-albuterol, liver oil-zinc oxide, Melatonin, menthol-cetylpyridinium **OR** phenol, metoCLOPramide **OR** metoCLOPramide (REGLAN) injection, nystatin, ondansetron **OR** ondansetron (ZOFRAN) IV, oxyCODONE, sodium chloride flush, traMADol   Vital Signs    Vitals:   02/12/19 1954 02/12/19 2029 02/13/19 0501 02/13/19 0718  BP: 105/67 110/60 (!) 101/57 (!) 100/46  Pulse: 82 80 63 61  Resp: 20 18 16 16   Temp: (!) 97.5 F (36.4 C) 97.7 F (36.5 C) 97.6 F (36.4 C) (!) 97.5 F (36.4 C)  TempSrc: Oral Oral Oral Axillary  SpO2: 97% 94% 95% 93%  Weight:      Height:        Intake/Output Summary (Last 24 hours) at  02/13/2019 1223 Last data filed at 02/13/2019 0622 Gross per 24 hour  Intake 811.63 ml  Output 526 ml  Net 285.63 ml   Last 3 Weights 02/12/2019 02/10/2019 02/09/2019  Weight (lbs) 120 lb 115 lb (No Data)  Weight (kg) 54.432 kg 52.164 kg (No Data)  Some encounter information is confidential and restricted. Go to Review Flowsheets activity to see all data.      Telemetry    AV sequentially paced- Personally Reviewed  Physical Exam  Elderly, frail-appearing woman in no distress GEN: No acute distress.   Neck: No JVD Cardiac: RRR, 2/6 systolic murmur heard throughout, normal mechanical S1 Respiratory: Clear to auscultation bilaterally. GI: Soft, nontender, non-distended  MS: No edema Neuro:  Nonfocal  Psych: Normal affect   Labs    High Sensitivity Troponin:  No results for input(s): TROPONINIHS in the last 720 hours.    Cardiac EnzymesNo results for input(s): TROPONINI in the last 168 hours. No results for input(s): TROPIPOC in the last 168 hours.   Chemistry Recent Labs  Lab 02/10/19 0613 02/25/2019 0547 02/12/19 0730 02/12/19 0847  NA 135 131* 129*  --   K 4.1 4.2 4.3  --   CL 94* 91* 91*  --   CO2 25 25 25   --   GLUCOSE 88 88 77  --   BUN  58* 64* 62*  --   CREATININE 2.84* 3.00* 2.91*  --   CALCIUM 8.6* 8.2* 8.0*  --   PROT  --   --   --  5.9*  ALBUMIN  --   --   --  3.3*  AST  --   --   --  13*  ALT  --   --   --  12  ALKPHOS  --   --   --  125  BILITOT  --   --   --  1.2  GFRNONAA 15* 14* 15*  --   GFRAA 17* 16* 17*  --   ANIONGAP 16* 15 13  --      Hematology Recent Labs  Lab 01/30/2019 0547 02/12/19 0729 02/13/19 1109  WBC 8.1 8.7 9.7  RBC 3.19* 2.99* 3.15*  HGB 9.0* 8.2* 8.7*  HCT 30.6* 27.9* 29.2*  MCV 95.9 93.3 92.7  MCH 28.2 27.4 27.6  MCHC 29.4* 29.4* 29.8*  RDW 18.3* 18.1* 18.3*  PLT 242 218 218    BNPNo results for input(s): BNP, PROBNP in the last 168 hours.   DDimer No results for input(s): DDIMER in the last 168 hours.   Radiology     Dg Sacrum/coccyx  Result Date: 02/05/2019 CLINICAL DATA:  Evaluate for sacral/decubitus ulcer EXAM: SACRUM AND COCCYX - 2+ VIEW COMPARISON:  None. FINDINGS: Markedly suboptimal evaluation of the sacrum and coccyx given extensive overlying bowel gas, coarse uterine fibroids, and poor penetration on lateral radiograph due to body habitus. Prior vertebral and sacral augmentations are noted. No acute fracture is evident. Extensive vascular calcium is noted in the pelvis. IMPRESSION: Suboptimal assessment of the sacrum and coccyx due to difficulties with patient positioning and body habitus. Consider contrast-enhanced CT imaging for further evaluation. Electronically Signed   By: Lovena Le M.D.   On: 02/14/2019 19:40    Cardiac Studies   Echo 11/05/2018: TTE 11/05/2018 1. The left ventricle has severely reduced systolic function, with an ejection fraction of 25-30%. The cavity size was mildly dilated. Left ventricular diastolic Doppler parameters are consistent with impaired relaxation. Left ventrical global  hypokinesis without regional wall motion abnormalities. 2. The right ventricle has mildly reduced systolic function. The cavity was moderately enlarged. There is no increase in right ventricular wall thickness. Right ventricular systolic pressure is moderately elevated with an estimated pressure of 53 mmHg. 3. Right atrial size was moderately dilated. 4. Left atrial size was moderately dilated. 5. The aortic valve was not well visualized. Moderate calcification of the aortic valve. Aortic valve regurgitation was not assessed by color flow Doppler. Unable to estimate adegree of aortic valve stenosis given severely reduced systolic function. 6. Mechanical mitral valve. Mean gradient 5 mm Hg. 7. Tricuspid valve regurgitation is moderate. 8. Left pleural effusion noted, 6.5 cm 9. Rhythm is atrial tachycardia, rate 105 bpm  Patient Profile     84 y.o. female with persistent atrial  fibrillation, chronic combined systolic and diastolic heart failure, nonischemic cardiomyopathy with history of CRT-P, and mitral valve disease status post mechanical mitral valve replacement.  She is admitted after mechanical fall with hip fracture in the setting of multiple chronic comorbidities.  Assessment & Plan    1.  Atrial tachycardia: Currently AV sequentially paced.  Telemetry reviewed. 2.  Persistent atrial fibrillation: Now in sinus rhythm on amiodarone after cardioversion.  Supratherapeutic INR noted.  Continue warfarin in setting of mechanical mitral valve replacement. 3.  Acute on chronic systolic and diastolic heart failure:  Complicated picture in this patient with progressive renal dysfunction.  She has not responded to a Lasix infusion.  Plans noted for intermittent hemodialysis once a temporary dialysis catheter can be placed. 4.  Mechanical mitral valve replacement: Supratherapeutic INR noted.  The patient has received FFP.  An INR is drawn and currently pending.  When her INR becomes subtherapeutic, she will likely require IV heparin to bridge her back to a therapeutic INR. 5.  Difficult IV access/phlebotomy: Using ultrasound guidance, and antecubital vein is accessed to draw labs.  This will be sent for a stat INR.      For questions or updates, please contact [place] Please consult [email or web address] for contact info under        Signed, Sherren Mocha, MD  02/13/2019, 12:23 PM

## 2019-02-13 NOTE — Progress Notes (Signed)
OT Cancellation Note  Patient Details Name: [name] MRN: [id number] DOB: 07-15-1934   Cancelled Treatment:    Reason Eval/Treat Not Completed: Medical issues which prohibited therapy(INR has been elevated over the past 2 days. Pt. is waiting HD access line placement. Will continue to monitor, and intervene once medically appropriate.)  Harrel Carina, MS, OTR/L 02/13/2019, 1:29 PM

## 2019-02-13 NOTE — Progress Notes (Signed)
Patient ID: [name], female   DOB: 10-26-1934, 84 y.o.   MRN: [id number] At this point hemodialysis is planned.  Previous INR yesterday was greater than 6.  Current INR is pending after 2 units of FFP given yesterday.  We will plan on proceeding with temporary dialysis catheter or permanent dialysis catheter placement once INR is 2 or less.  Discussed with daughter today in the room.  Placement likely would be Sunday or Monday.  Carrie Chess, MD FACS [id number]

## 2019-02-13 NOTE — Progress Notes (Signed)
[place] [place] NAME: [name]    MR#:  [id number]  DATE OF BIRTH:  [birth year]  SUBJECTIVE:  CHIEF COMPLAINT:  No chief complaint on file.  Patient continues to be very drowsy. Afebrile Lab was unable to draw blood  Patient speaks 1-2 words.  Confused.   Dr. Burt Knack was ablt  Draw blood under [place]  REVIEW OF SYSTEMS:  Review of Systems  Unable to perform ROS: Acuity of condition   DRUG ALLERGIES:   Allergies  Allergen Reactions  . Penicillins Rash    ** daughter reports facial swelling**   Has patient had a PCN reaction causing immediate rash, facial/tongue/throat swelling, SOB or lightheadedness with hypotension: YES Has patient had a PCN reaction causing severe rash involving mucus membranes or skin necrosis: NO Has patient had a PCN reaction that required hospitalization NO Has patient had a PCN reaction occurring within the last 10 years: NO If all of the above answers are "NO", then may proceed with Cephalosporin use.  . Ace Inhibitors     Cough  . Doxycycline Nausea Only  . Hydrocodone Itching and Nausea Only  . Mercury     Other reaction(s): Unknown  . Silver Dermatitis    Severe itching  . Cephalexin Rash  . Clindamycin/Lincomycin Rash  . Nickel Rash   VITALS:  Blood pressure (!) 100/46, pulse 61, temperature (!) 97.5 F (36.4 C), temperature source Axillary, resp. rate 16, height 4\' 11"  (1.499 m), weight 54.4 kg, SpO2 93 %. PHYSICAL EXAMINATION:  Physical Exam  General patient chronically ill-appearing currently lethargic HENT:  Nose: No mucosal edema.  Mouth/Throat: No oropharyngeal exudate or posterior oropharyngeal edema.  Eyes: Pupils are equal, round, and reactive to light. Conjunctivae,  Neck: No JVD present. Carotid bruit is not present. No edema present. No thyroid mass and no thyromegaly present.  Cardiovascular: S1 normal and S2 normal.  Systolic murmur Respiratory: No respiratory  distress.no wheezing.  No rales  GI: Soft. Bowel sounds are normal. There is no abdominal tenderness.  Musculoskeletal:     Right ankle: She exhibits swelling.     Left ankle: She exhibits swelling.     Comments: Right hip status post surgery with wound VAC and edema  Lymphadenopathy:    She has no cervical adenopathy.  Neurological: Lethargic Skin: Skin is warm. Nails show no clubbing.  Sacral decubitus ulcer now unstageable Psychiatric: Lethargic  Foley catheter in place, skin lesions on leg and sacrum.  LABORATORY PANEL:  Female CBC Recent Labs  Lab 02/13/19 1109  WBC 9.7  HGB 8.7*  HCT 29.2*  PLT 218   ------------------------------------------------------------------------------------------------------------------ Chemistries  Recent Labs  Lab 02/10/19 0613  02/12/19 0730 02/12/19 0847  NA 135   < > 129*  --   K 4.1   < > 4.3  --   CL 94*   < > 91*  --   CO2 25   < > 25  --   GLUCOSE 88   < > 77  --   BUN 58*   < > 62*  --   CREATININE 2.84*   < > 2.91*  --   CALCIUM 8.6*   < > 8.0*  --   MG 2.6*  --   --   --   AST  --   --   --  13*  ALT  --   --   --  12  ALKPHOS  --   --   --  125  BILITOT  --   --   --  1.2   < > = values in this interval not displayed.   RADIOLOGY:  No results found. ASSESSMENT AND PLAN:   * Acute encephalopathy - multifactorial Patient's prognosis is very poor No signs of infection. Patient being started on hemodialysis to help with possible uremic encephalopathy  * Acute on chronic combined systolic and diastolic heart failure.   Continue IV Lasix.  Not diuresing well  * Paroxysmal atrial fibrillation with mitral valve replacement Status post cardioversion and a normal sinus rhythm today Coumadin held for hemodialysis catheter placement.    * Acute blood loss anemia secondary to large right hip hematoma and skin tear right leg.  Also has other hematomas on the legs. Hemoglobin stable. Monitor.  * Hip fracture status post  [facility].  With hematoma. physical therapy recommending rehab.  Family wants patient to go home once clinically more stable and has enough endurance and strength.  Do not want SNF placement.  Continue wound VAC, care per orhtopedics   * AKI on Chronic kidney disease stage III.  With cardiorenal component and vancomycin, plan to start on dialysis. INR pending for Temp HD cath placement  * sacral decubiti-unstageable with some tunneling.  Newly developing right heel pressure ulcer pressure pads applied.   Surgery following patient.  wound care.  Encouraged to reposition patient frequently-every 1-2 hours  Patient nutrition is very poor.  NG tube for feeding recommended by Dr. Posey Pronto.  Family refused.  CODE STATUS: DNR  TOTAL TIME TAKING CARE OF THIS PATIENT: 37 minutes.   Leia Alf [name] M.D on 02/13/2019 at 1:07 PM  Between 7am to 6pm - Pager - [phone]  After 6pm go to [email or web address] - Proofreader  [facility]  Office  [phone]  CC: Primary care physician; Rusty Aus, MD  Note: This dictation was prepared with Dragon dictation along with smaller phrase technology. Any transcriptional errors that result from this process are unintentional.

## 2019-02-13 NOTE — Progress Notes (Signed)
Family is concerned about her increased edema - face, stomach, and legs.  Her SOB has increased. I increased her O2NC to 4L.  From past experience they don't think her breath sounds are a good indicator. Apparently in the past her lungs sounded clear but her x-ray showed severe pulmonary edema.   Jeanette Caprice, RN is contacting the on-call hospitalist.  Urine output for the day is only 75 ml.

## 2019-02-13 NOTE — Progress Notes (Signed)
INR blood sample obtained by Dr. Sherren Mocha under U/S guidance after multiple attempts by phlebotomy

## 2019-02-13 NOTE — Progress Notes (Signed)
PT Cancellation Note  Patient Details Name: [name] MRN: [id number] DOB: 1935-01-25   Cancelled Treatment:    Reason Eval/Treat Not Completed: Medical issues which prohibited therapy(INR elevated last 2 days, no value available for this date as of time of note. Pt is now awaiting HD access placement. WIll hold PT at this time, resume once INR in safe range.)  12:32 PM, 02/13/19 Etta Grandchild, PT, DPT Physical Therapist - [facility]  [phone] ([place])    Winona C 02/13/2019, 12:32 PM

## 2019-02-13 NOTE — Progress Notes (Signed)
[facility]  ROUNDING NOTE   Subjective:  Still awaiting INR at this point in time. Difficulties noted with the blood draw. Awaiting dialysis catheter placement once INR is 2 or less.  Objective:  Vital signs in last 24 hours:  Temp:  [96.5 F (35.8 C)-97.7 F (36.5 C)] 97.5 F (36.4 C) (08/15 0718) Pulse Rate:  [58-82] 61 (08/15 0718) Resp:  [16-20] 16 (08/15 0718) BP: (95-112)/(46-69) 100/46 (08/15 0718) SpO2:  [93 %-100 %] 93 % (08/15 0718)  Weight change:  Filed Weights   02/10/19 0500 02/12/19 0542  Weight: 52.2 kg 54.4 kg    Intake/Output: I/O last 3 completed shifts: In: 1380.1 [P.O.:200; I.V.:383.5; Blood:796.6] Out: 1126 [Urine:1100; Drains:25; Stool:1]   Intake/Output this shift:  No intake/output data recorded.  Physical Exam: General: Chronically ill-appearing  Head: Normocephalic, atraumatic. Moist oral mucosal membranes  Eyes: Anicteric  Neck: Supple, trachea midline  Lungs:  Bilateral crackles at bases, normal effort  Heart: Regular, ejection click heard  Abdomen:  Soft, nontender  Extremities: + peripheral edema, legs wrapped  Neurologic: Lethargic, not following commands  Skin: No lesions  Access: none    Basic Metabolic Panel: Recent Labs  Lab 02/08/19 0405 02/09/19 0508 02/10/19 0613 02/14/2019 0547 02/12/19 0730  NA 137 131* 135 131* 129*  K 3.8 4.0 4.1 4.2 4.3  CL 97* 91* 94* 91* 91*  CO2 27 26 25 25 25   GLUCOSE 97 97 88 88 77  BUN 47* 51* 58* 64* 62*  CREATININE 2.28* 2.69* 2.84* 3.00* 2.91*  CALCIUM 9.0 8.7* 8.6* 8.2* 8.0*  MG  --  2.4 2.6*  --   --     Liver Function Tests: Recent Labs  Lab 02/12/19 0847  AST 13*  ALT 12  ALKPHOS 125  BILITOT 1.2  PROT 5.9*  ALBUMIN 3.3*   No results for input(s): LIPASE, AMYLASE in the last 168 hours. No results for input(s): AMMONIA in the last 168 hours.  CBC: Recent Labs  Lab 02/08/19 0405 02/09/19 0508 02/10/19 0613 01/31/2019 0547 02/12/19 0729  WBC 9.0 8.8  8.8 8.1 8.7  NEUTROABS 7.7  --   --   --   --   HGB 8.7* 8.4* 9.8* 9.0* 8.2*  HCT 29.9* 28.7* 32.6* 30.6* 27.9*  MCV 96.5 96.6 95.0 95.9 93.3  PLT 299 273 270 242 218    Cardiac Enzymes: No results for input(s): CKTOTAL, CKMB, CKMBINDEX, TROPONINI in the last 168 hours.  BNP: Invalid input(s): POCBNP  CBG: No results for input(s): GLUCAP in the last 168 hours.  Microbiology: Results for orders placed or performed during the hospital encounter of 01/16/2019  Novel Coronavirus,NAA,(SEND-OUT TO REF LAB - TAT 24-48 hrs); Hosp Order     Status: None   Collection Time: 01/19/19 12:10 AM   Specimen: Nasopharyngeal Swab; Respiratory  Result Value Ref Range Status   SARS-CoV-2, NAA NOT DETECTED NOT DETECTED Final    Comment: (NOTE) This test was developed and its performance characteristics determined by [employer]. This test has not been FDA cleared or approved. This test has been authorized by FDA under an Emergency Use Authorization (EUA). This test is only authorized for the duration of time the declaration that circumstances exist justifying the authorization of the emergency use of in vitro diagnostic tests for detection of SARS-CoV-2 virus and/or diagnosis of COVID-19 infection under section 564(b)(1) of the Act, 21 U.S.C. KA:123727), unless the authorization is terminated or revoked sooner. When diagnostic testing is negative, the possibility of  a false negative result should be considered in the context of a patient's recent exposures and the presence of clinical signs and symptoms consistent with COVID-19. An individual without symptoms of COVID-19 and who is not shedding SARS-CoV-2 virus would expect to have a negative (not detected) result in this assay. Performed  At: [facility] [place], [place] [id number] Rush Farmer MD [id number]    [place]  Final    Comment: Performed at [facility],  [address] [place], [place] [address]  Urine Culture     Status: Abnormal   Collection Time: 01/31/19  2:18 PM   Specimen: Urine, Random  Result Value Ref Range Status   Specimen Description   Final    URINE, RANDOM Performed at [facility], [address] [name], [place] [address]    Special Requests   Final    NONE Performed at [facility], [address] [place], [place] [address]    Culture >=100,000 COLONIES/mL ENTEROCOCCUS FAECALIS (A)  Final   Report Status 02/02/2019 FINAL  Final   Organism ID, Bacteria ENTEROCOCCUS FAECALIS (A)  Final      Susceptibility   Enterococcus faecalis - MIC*    AMPICILLIN <=2 SENSITIVE Sensitive     LEVOFLOXACIN 0.5 SENSITIVE Sensitive     NITROFURANTOIN <=16 SENSITIVE Sensitive     VANCOMYCIN 1 SENSITIVE Sensitive     * >=100,000 COLONIES/mL ENTEROCOCCUS FAECALIS  CULTURE, BLOOD (ROUTINE X 2) w Reflex to ID Panel     Status: None (Preliminary result)   Collection Time: 02/12/2019  5:42 AM   Specimen: BLOOD  Result Value Ref Range Status   Specimen Description BLOOD RIGHT ANTECUBITAL  Final   Special Requests   Final    BOTTLES DRAWN AEROBIC AND ANAEROBIC Blood Culture adequate volume   Culture   Final    NO GROWTH 2 DAYS Performed at [facility], [address] [place], [place] [address]    Report Status PENDING  Incomplete  CULTURE, BLOOD (ROUTINE X 2) w Reflex to ID Panel     Status: None (Preliminary result)   Collection Time: 02/05/2019  7:16 AM   Specimen: BLOOD  Result Value Ref Range Status   Specimen Description BLOOD RIGHT [facility]  Final   Special Requests   Final    BOTTLES DRAWN AEROBIC AND ANAEROBIC Blood Culture adequate volume   Culture   Final    NO GROWTH 2 DAYS Performed at [facility], [address], [place], [place] [address]    Report Status PENDING  Incomplete    Coagulation Studies: Recent Labs    02/08/2019 0547 02/12/19 0729  LABPROT 43.7* 55.8*  INR 4.7*  6.5*    Urinalysis: Recent Labs    02/19/2019 0155 02/26/2019 1849  COLORURINE AMBER* YELLOW*  LABSPEC 1.013 1.012  PHURINE 5.0 5.0  GLUCOSEU NEGATIVE NEGATIVE  HGBUR MODERATE* LARGE*  BILIRUBINUR NEGATIVE NEGATIVE  KETONESUR NEGATIVE NEGATIVE  PROTEINUR 100* 100*  NITRITE NEGATIVE NEGATIVE  LEUKOCYTESUR MODERATE* LARGE*      Imaging: Dg Sacrum/coccyx  Result Date: 02/04/2019 CLINICAL DATA:  Evaluate for sacral/decubitus ulcer EXAM: SACRUM AND COCCYX - 2+ VIEW COMPARISON:  None. FINDINGS: Markedly suboptimal evaluation of the sacrum and coccyx given extensive overlying bowel gas, coarse uterine fibroids, and poor penetration on lateral radiograph due to body habitus. Prior vertebral and sacral augmentations are noted. No acute fracture is evident. Extensive vascular calcium is noted in the pelvis. IMPRESSION: Suboptimal assessment  of the sacrum and coccyx due to difficulties with patient positioning and body habitus. Consider contrast-enhanced CT imaging for further evaluation. Electronically Signed   By: Lovena Le M.D.   On: 02/27/2019 19:40     Medications:   . sodium chloride    . furosemide (LASIX) infusion 10 mg/hr (02/13/19 0324)   . acetaminophen  650 mg Oral Once  . acidophilus  1 capsule Oral Daily  . amiodarone  200 mg Oral BID  . Chlorhexidine Gluconate Cloth  6 each Topical Q0600  . collagenase   Topical Daily  . mouth rinse  15 mL Mouth Rinse BID  . multivitamin-lutein  1 capsule Oral Daily  . nystatin cream   Topical BID  . pantoprazole  40 mg Oral Daily  . sodium chloride flush  3 mL Intravenous Q12H  . sodium chloride flush  3 mL Intravenous Q12H  . vitamin C  500 mg Oral Daily  . Warfarin - Pharmacist Dosing Inpatient   Does not apply q1800   acetaminophen, acetaminophen, calcium carbonate, ipratropium-albuterol, liver oil-zinc oxide, Melatonin, menthol-cetylpyridinium **OR** phenol, metoCLOPramide **OR** metoCLOPramide (REGLAN) injection, nystatin,  ondansetron **OR** ondansetron (ZOFRAN) IV, oxyCODONE, sodium chloride flush, traMADol  Assessment/ Plan:  [name] is a 84 y.o. white female with congestive heart failure EF 25%, hypertension, atrial fibrillation, lymphedema, mitral valve replacement (mechanical valve) requiring chronic anticoagulation, COPD, osteoporosis, history of multiple lumbar compression fractures requiring kyphoplasty, history of left leg hematoma with evacuation and porcine allograft, was admitted on 12/25/2018 with right hip fracture.   1.  Acute kidney injury, chronic kidney disease stage III.  Baseline 1.37/GFR 36 from December 15, 2018 Acute renal failure may be secondary to vancomycin usage. Previously required renal replacement therapy.  -We continue to await placement of temporary dialysis catheter.  Awaiting INR of 2 or less as per vascular surgery.  Patient has been administered 2 units of FFP.  Awaiting new INR this morning.  2.  Anemia of chronic kidney disease.  At last check hemoglobin was 8.2.  Recommend rechecking CBC today.  3.  Chronic systolic congestive heart failure with EF 25%.  patient to be maintained on Lasix drip until dialysis can be started.  4.  Pulmonary hypertension with respiratory failure: Continues to require O2 with nasal cannula.  5.  Mechanical heart valve requiring chronic anticoagulation.  Awaiting repeat INR today.   LOS: 26 [place] 8/15/202011:09 AM

## 2019-02-14 ENCOUNTER — Inpatient Hospital Stay: Payer: Medicare Other

## 2019-02-14 DIAGNOSIS — D689 Coagulation defect, unspecified: Secondary | ICD-10-CM

## 2019-02-14 DIAGNOSIS — I4819 Other persistent atrial fibrillation: Secondary | ICD-10-CM

## 2019-02-14 DIAGNOSIS — J810 Acute pulmonary edema: Secondary | ICD-10-CM

## 2019-02-14 DIAGNOSIS — J9601 Acute respiratory failure with hypoxia: Secondary | ICD-10-CM

## 2019-02-14 LAB — MRSA PCR SCREENING: MRSA by PCR: NEGATIVE

## 2019-02-14 LAB — COMPREHENSIVE METABOLIC PANEL
ALT: 20 U/L (ref 0–44)
AST: 25 U/L (ref 15–41)
Albumin: 3.9 g/dL (ref 3.5–5.0)
Alkaline Phosphatase: 144 U/L — ABNORMAL HIGH (ref 38–126)
Anion gap: 15 (ref 5–15)
BUN: 72 mg/dL — ABNORMAL HIGH (ref 8–23)
CO2: 24 mmol/L (ref 22–32)
Calcium: 8.5 mg/dL — ABNORMAL LOW (ref 8.9–10.3)
Chloride: 85 mmol/L — ABNORMAL LOW (ref 98–111)
Creatinine, Ser: 3.59 mg/dL — ABNORMAL HIGH (ref 0.44–1.00)
GFR calc Af Amer: 13 mL/min — ABNORMAL LOW (ref >60)
GFR calc non Af Amer: 11 mL/min — ABNORMAL LOW (ref >60)
Glucose, Bld: 100 mg/dL — ABNORMAL HIGH (ref 70–99)
Potassium: 4.6 mmol/L (ref 3.5–5.1)
Sodium: 124 mmol/L — ABNORMAL LOW (ref 135–145)
Total Bilirubin: 1.4 mg/dL — ABNORMAL HIGH (ref 0.3–1.2)
Total Protein: 6.8 g/dL (ref 6.5–8.1)

## 2019-02-14 LAB — GLUCOSE, CAPILLARY: Glucose-Capillary: 111 mg/dL — ABNORMAL HIGH (ref 70–99)

## 2019-02-14 LAB — PREALBUMIN: Prealbumin: 12.9 mg/dL — ABNORMAL LOW (ref 18–38)

## 2019-02-14 LAB — BLOOD GAS, VENOUS
Acid-base deficit: 2.1 mmol/L — ABNORMAL HIGH (ref 0.0–2.0)
Bicarbonate: 27.9 mmol/L (ref 20.0–28.0)
FIO2: 100
O2 Saturation: 38.9 %
Patient temperature: 37
pCO2, Ven: 73 mmHg (ref 44.0–60.0)
pH, Ven: 7.19 — CL (ref 7.250–7.430)
pO2, Ven: <31 mmHg — CL (ref 32.0–45.0)

## 2019-02-14 LAB — PROTIME-INR
INR: 3.8 — ABNORMAL HIGH (ref 0.8–1.2)
Prothrombin Time: 37.1 seconds — ABNORMAL HIGH (ref 11.4–15.2)

## 2019-02-14 MED ORDER — PANTOPRAZOLE SODIUM 40 MG IV SOLR
40.0000 mg | INTRAVENOUS | Status: DC
  Administered 2019-02-14: 40 mg via INTRAVENOUS
  Filled 2019-02-14: qty 40

## 2019-02-14 MED ORDER — GLYCOPYRROLATE 0.2 MG/ML IJ SOLN
0.1000 mg | INTRAMUSCULAR | Status: DC | PRN
  Administered 2019-02-14 – 2019-02-15 (×2): 0.1 mg via INTRAVENOUS
  Filled 2019-02-14 (×2): qty 1

## 2019-02-14 MED ORDER — GLYCOPYRROLATE 0.2 MG/ML IJ SOLN
0.1000 mg | INTRAMUSCULAR | Status: DC
  Administered 2019-02-14: 0.1 mg via INTRAVENOUS
  Filled 2019-02-14: qty 1

## 2019-02-14 MED ORDER — METOCLOPRAMIDE HCL 5 MG/ML IJ SOLN
5.0000 mg | Freq: Three times a day (TID) | INTRAMUSCULAR | Status: DC | PRN
  Administered 2019-02-14: 5 mg via INTRAVENOUS
  Filled 2019-02-14: qty 2

## 2019-02-14 MED ORDER — FENTANYL CITRATE (PF) 100 MCG/2ML IJ SOLN: 12.5000 ug | Freq: Once | INTRAMUSCULAR | Status: DC

## 2019-02-14 MED ORDER — MORPHINE SULFATE (PF) 2 MG/ML IV SOLN
1.0000 mg | INTRAVENOUS | Status: DC | PRN
  Administered 2019-02-14 – 2019-02-15 (×2): 1 mg via INTRAVENOUS
  Administered 2019-02-15: 2 mg via INTRAVENOUS
  Filled 2019-02-14 (×3): qty 1

## 2019-02-14 NOTE — Progress Notes (Signed)
Ms. Heiberg is an 84 year old female with multiple comorbidities very debilitated recent hip fracture and now sacral decubitus ulcer.  She now has respiratory failure, failure to thrive severe malnutrition, renal failure and severe coagulopathy suggesting multiple organ failure. We are seeing her for her decubitus ulcer. I have seen her and the decubitus ulcer; it  has a clean base,  there is some bone exposed.  There is no evidence of necrotic tissue there is only fibrinous material.  She is responding well to the collagenase.  I re-dressed the  wound. I had an extensive discussion with the family about options and given her deterioration I do not recommend formal debridement or any potential graft that may significantly increase her morbidity and mortality related to the procedure.  I think that the family is contemplating not even doing dialysis at this time and thinking about comfort care.  We will be available if any surgical needs arise.  No need for any surgical indication at this time.  Please note that I spent over 40 minutes in this encounter with greater than 50% spent in coordination and counseling of her care

## 2019-02-14 NOTE — Consult Note (Signed)
PULMONARY/CCM PROGRESS NOTE  PT PROFILE: 84 y.o. with severe systolic CHF, severe aortic stenosis, mechanical mitral valve, chronic atrial fibrillation, PPM in place.  Endured 3 hospitalizations in May and June of this year.  After 1 of the hospitalizations, patient required discharge to nursing home.  Return to [facility] 6/26 with R hip fracture.  She was felt to be too high risk for repair at this hospital and was transferred to [facility] for ORIF.  Transferred back to [facility] 7/20.  Hospital course has been complicated by minimal progression with physical therapy, development of pressure ulcers, progressive renal insufficiency and pulmonary edema.  Transferred to ICU/SDU 8/16 requiring HFNC with pulmonary edema pattern on CXR.  Family initially desiring HD catheter placement and dialysis.  At time of transfer, patient was poorly oriented and unable to provide significant history.  She denied pain.  She reported dyspnea.   Past Medical History:  Diagnosis Date  . Arthritis    "back, hands, knees" (04/10/2016)  . Chronic back pain   . 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 EF 25% by echo in 08/2015 b. RHC in 08/2015 showed normal filling pressures; c. 10/2018 Echo (in setting of atrial tachycardia): EF 25-30%, impaired relaxation. Glob HK. RVSP 53mHg. Mod dil RA/LA. Mech MV w/ nl fxn (mean grad 549mg). Mod TR.   . Marland Kitchenhronic kidney disease   . Compression fracture    "several; all in my back" (04/10/2016)  . GERD (gastroesophageal reflux disease)   . Hypertension   . Lymphedema   . Pacemaker 05/2017  . PAF (paroxysmal atrial fibrillation) ([place]   a. On amio & Coumadin - CHA2DS2VASc = 4.  . S/P MVR (mitral valve replacement)    a. MVR 1994 b. redo MVR in 08/2014 - on Coumadin  . Shortness of breath dyspnea    Past Surgical History:  Procedure Laterality Date  . BREAST LUMPECTOMY Right 2005?   benign lump excision  . CARDIAC CATHETERIZATION    .  [place]   "MVR; MVR"  . CARDIOVERSION N/A 11/06/2018   Procedure: CARDIOVERSION;  Surgeon: GoMinna MerrittsMD;  Location: [facility] ORS;  Service: Cardiovascular;  Laterality: N/A;  . CARDIOVERSION N/A 02/02/2019   Procedure: CARDIOVERSION;  Surgeon: GoMinna MerrittsMD;  Location: [facility] ORS;  Service: Cardiovascular;  Laterality: N/A;  . CATARACT EXTRACTION W/PHACO Right 02/20/2015   Procedure: CATARACT EXTRACTION PHACO AND INTRAOCULAR LENS PLACEMENT ([place]  Surgeon: StEstill CottaMD;  Location: [facility] ORS;  Service: Ophthalmology;  Laterality: Right;  US01:31.2 AP 25.3% CDE40.13 Fluid lot # [id number]  . DRESSING CHANGE UNDER ANESTHESIA Left 11/17/2018   Procedure: DRESSING CHANGE;  Surgeon: ByRobert BellowMD;  Location: [facility] ORS;  Service: General;  Laterality: Left;  no anesthesia  . ELECTROPHYSIOLOGIC STUDY N/A 10/09/2015   Procedure: CARDIOVERSION;  Surgeon: MuWellington HampshireMD;  Location: [facility] ORS;  Service: Cardiovascular;  Laterality: N/A;  . INCISION AND DRAINAGE OF WOUND Left 11/13/2018   Procedure: LEFT LEG DRESSING CHANGE;  Surgeon: ByRobert BellowMD;  Location: [facility] ORS;  Service: General;  Laterality: Left;  . INCISION AND DRAINAGE OF WOUND Left 11/19/2018   Procedure: LEFT LEG DRESSING CHANGE AND GRAFT APPLICATION;  Surgeon: ByRobert BellowMD;  Location: [facility] ORS;  Service: General;  Laterality: Left;  . IR GENERIC HISTORICAL  04/01/2016   IR RADIOLOGIST EVAL & MGMT 04/01/2016 [facility]  . IR GENERIC HISTORICAL  04/15/2016  IR VERTEBROPLASTY CERV/THOR BX INC UNI/BIL INC/INJECT/IMAGING 04/15/2016 Luanne Bras, MD [facility]  . IR GENERIC HISTORICAL  04/15/2016   IR VERTEBROPLASTY EA ADDL (T&LS) BX INC UNI/BIL INC INJECT/IMAGING 04/15/2016 Luanne Bras, MD [facility]  . IR GENERIC HISTORICAL  04/15/2016   IR VERTEBROPLASTY EA ADDL (T&LS) BX INC UNI/BIL INC INJECT/IMAGING 04/15/2016 Luanne Bras, MD [facility]  .  IR GENERIC HISTORICAL  05/20/2016   IR RADIOLOGIST EVAL & MGMT 05/20/2016 [facility]  . IR GENERIC HISTORICAL  06/13/2016   IR VERTEBROPLASTY EA ADDL (T&LS) BX INC UNI/BIL INC INJECT/IMAGING 06/13/2016 Luanne Bras, MD [facility]  . IR GENERIC HISTORICAL  06/13/2016   IR SACROPLASTY BILATERAL 06/13/2016 Luanne Bras, MD [facility]  . IR GENERIC HISTORICAL  06/13/2016   IR VERTEBROPLASTY LUMBAR BX INC UNI/BIL INC/INJECT/IMAGING 06/13/2016 Luanne Bras, MD [facility]  . IRRIGATION AND DEBRIDEMENT HEMATOMA Left 07/05/2015   Procedure: IRRIGATION AND DEBRIDEMENT HEMATOMA;  Surgeon: Robert Bellow, MD;  Location: [facility] OR[facility];  Service: General;  Laterality: Left;  . IRRIGATION AND DEBRIDEMENT HEMATOMA Left 11/11/2018   Procedure: IRRIGATION AND DEBRIDEMENT LEFT LEG HEMATOMA;  Surgeon: Robert Bellow, MD;  Location: [facility] ORS;  Service: General;  Laterality: Left;  . Open reduction and internal fixation of a right intertrochanteric femur fracture  01/11/2019   Dr. Margarita Rana ([place])  . pyloric stenosis  07/1937  . US ECHOCARDIOGRAPHY     Social History   Socioeconomic History  . Marital status: Widowed    Spouse name: Not on file  . Number of children: Not on file  . Years of education: Not on file  . Highest education level: Not on file  Occupational History  . Not on file  Social Needs  . Financial resource strain: Not on file  . Food insecurity    Worry: Not on file    Inability: Not on file  . Transportation needs    Medical: Not on file    Non-medical: Not on file  Tobacco Use  . Smoking status: Former Smoker    Packs/day: 1.00    Years: 27.00    Pack years: 27.00    Types: Cigarettes    Quit date: 07/01/1980    Years since quitting: 38.6  . Smokeless tobacco: Never Used  . Tobacco comment: "quit smoking ~ 1980  Substance and Sexual Activity  . Alcohol use: Not Currently    Alcohol/week: 0.0 standard drinks    Comment: 04/10/2016 "I'll have a  drink on holidays/special occasions"  . Drug use: No  . Sexual activity: Never  Lifestyle  . Physical activity    Days per week: Not on file    Minutes per session: Not on file  . Stress: Not on file  Relationships  . Social Herbalist on phone: Not on file    Gets together: Not on file    Attends religious service: Not on file    Active member of club or organization: Not on file    Attends meetings of clubs or organizations: Not on file    Relationship status: Not on file  . Intimate partner violence    Fear of current or ex partner: Not on file    Emotionally abused: Not on file    Physically abused: Not on file    Forced sexual activity: Not on file  Other Topics Concern  . Not on file  Social History Narrative  . Not on file   Family History  Problem Relation Age of Onset  . Stroke Mother   . Renal Disease Father     MICRO DATA: Results for orders placed or performed during the hospital encounter of 01/10/2019  Novel Coronavirus,NAA,(SEND-OUT TO REF LAB - TAT 24-48 hrs); Hosp Order     Status: None   Collection Time: 01/19/19 12:10 AM   Specimen: Nasopharyngeal Swab; Respiratory  Result Value Ref Range Status   SARS-CoV-2, NAA NOT DETECTED NOT DETECTED Final    Comment: (NOTE) This test was developed and its performance characteristics determined by [employer]. This test has not been FDA cleared or approved. This test has been authorized by FDA under an Emergency Use Authorization (EUA). This test is only authorized for the duration of time the declaration that circumstances exist justifying the authorization of the emergency use of in vitro diagnostic tests for detection of SARS-CoV-2 virus and/or diagnosis of COVID-19 infection under section 564(b)(1) of the Act, 21 U.S.C. 174YCX-4(G)(8), unless the authorization is terminated or revoked sooner. When diagnostic testing is negative, the possibility of a false negative result should be  considered in the context of a patient's recent exposures and the presence of clinical signs and symptoms consistent with COVID-19. An individual without symptoms of COVID-19 and who is not shedding SARS-CoV-2 virus would expect to have a negative (not detected) result in this assay. Performed  At: [facility] [address] [place], [place] [id number] Rush Farmer MD [id number]    [place]  Final    Comment: Performed at [facility], [address] [place], [place] [address]  Urine Culture     Status: Abnormal   Collection Time: 01/31/19  2:18 PM   Specimen: Urine, Random  Result Value Ref Range Status   Specimen Description   Final    URINE, RANDOM Performed at [facility], [address] [place], [place] [address]    Special Requests   Final    NONE Performed at [facility], [address] [place], [place] [address]    Culture >=100,000 COLONIES/mL ENTEROCOCCUS FAECALIS (A)  Final   Report Status 02/02/2019 FINAL  Final   Organism ID, Bacteria ENTEROCOCCUS FAECALIS (A)  Final      Susceptibility   Enterococcus faecalis - MIC*    AMPICILLIN <=2 SENSITIVE Sensitive     LEVOFLOXACIN 0.5 SENSITIVE Sensitive     NITROFURANTOIN <=16 SENSITIVE Sensitive     VANCOMYCIN 1 SENSITIVE Sensitive     * >=100,000 COLONIES/mL ENTEROCOCCUS FAECALIS  CULTURE, BLOOD (ROUTINE X 2) w Reflex to ID Panel     Status: None (Preliminary result)   Collection Time: 02/24/2019  5:42 AM   Specimen: BLOOD  Result Value Ref Range Status   Specimen Description BLOOD RIGHT ANTECUBITAL  Final   Special Requests   Final    BOTTLES DRAWN AEROBIC AND ANAEROBIC Blood Culture adequate volume   Culture   Final    NO GROWTH 3 DAYS Performed at [facility], [address], [place], [place] [address]    Report Status PENDING  Incomplete  CULTURE, BLOOD (ROUTINE X 2) w Reflex to ID Panel     Status: None (Preliminary result)    Collection Time: 02/28/2019  7:16 AM   Specimen: BLOOD  Result Value Ref Range Status   Specimen Description BLOOD RIGHT [facility]  Final   Special Requests   Final    BOTTLES DRAWN AEROBIC AND ANAEROBIC Blood Culture adequate volume   Culture   Final  NO GROWTH 3 DAYS Performed at [facility], [address] [place], [place] [address]    Report Status PENDING  Incomplete  MRSA PCR Screening     Status: None   Collection Time: 02/14/19  9:23 AM   Specimen: Nasal Mucosa; Nasopharyngeal  Result Value Ref Range Status   MRSA by PCR NEGATIVE NEGATIVE Final    Comment:        The GeneXpert MRSA Assay (FDA approved for NASAL specimens only), is one component of a comprehensive MRSA colonization surveillance program. It is not intended to diagnose MRSA infection nor to guide or monitor treatment for MRSA infections. Performed at [facility], [address] [place], [place] [address]      ANTIMICROBIALS:  Anti-infectives (From admission, onward)   Start     Dose/Rate Route Frequency Ordered Stop   02/10/19 1800  levofloxacin (LEVAQUIN) IVPB 500 mg  Status:  Discontinued     500 mg 100 mL/hr over 60 Minutes Intravenous Every 48 hours 02/08/19 1630 02/09/19 1513   02/08/19 1645  levofloxacin (LEVAQUIN) IVPB 750 mg     750 mg 100 mL/hr over 90 Minutes Intravenous  Once 02/08/19 1630 02/08/19 1932   02/08/19 1600  aztreonam (AZACTAM) 0.5 g in dextrose 5 % 50 mL IVPB  Status:  Discontinued     0.5 g 100 mL/hr over 30 Minutes Intravenous Every 8 hours 02/08/19 1521 02/08/19 1629   02/03/19 1800  vancomycin (VANCOCIN) IVPB 1000 mg/200 mL premix     1,000 mg 200 mL/hr over 60 Minutes Intravenous  Once 02/03/19 1653 02/03/19 1904   02/02/19 1500  vancomycin (VANCOCIN) 1,000 mg in sodium chloride 0.9 % 250 mL IVPB  Status:  Discontinued     1,000 mg 250 mL/hr over 60 Minutes Intravenous  Once 02/02/19 1406 02/02/19 1420   02/02/19 1500  vancomycin (VANCOCIN) IVPB 1000  mg/200 mL premix     1,000 mg 200 mL/hr over 60 Minutes Intravenous  Once 02/02/19 1420 02/02/19 1550   02/02/19 1405  vancomycin variable dose per unstable renal function (pharmacist dosing)  Status:  Discontinued      Does not apply See admin instructions 02/02/19 1406 02/04/19 1255       SUBJ: Poorly oriented.  No overt distress.  Reports dyspnea.  Denies pain.  OBJ: Vitals:   02/14/19 0786 02/14/19 0919 02/14/19 0920 02/14/19 1000  BP:  117/78  128/90  Pulse:  80 69 67  Resp:  18 (!) 26 18  Temp:  97.6 F (36.4 C)    TempSrc:  Oral    SpO2: (!) 77% 90% 94% 97%  Weight:  62.5 kg    Height:      HFNC 100%  Gen: Very frail, no overt distress HEENT: NCAT, sclerae white Neck: No LAN, + JVD Lungs: Bibasilar crackles, no wheezes Cardiovascular: Regular (paced), mechanical S1, III/VI systolic M of aortic origin Abdomen: Soft, NT, +BS Ext: BLE in dressings.  Extensive ecchymoses, no edema noted Neuro: CNs intact, no focal deficits noted Skin: No lesions noted   BMP Latest Ref Rng & Units 02/14/2019 02/13/2019 02/13/2019  Glucose 70 - 99 mg/dL 100(H) 106(H) 93  BUN 8 - 23 mg/dL 72(H) 72(H) 70(H)  Creatinine 0.44 - 1.00 mg/dL 3.59(H) 3.31(H) 3.29(H)  BUN/Creat Ratio 12 - 28 - - -  Sodium 135 - 145 mmol/L 124(L) 124(L) 126(L)  Potassium 3.5 - 5.1 mmol/L 4.6 4.6 4.4  Chloride 98 - 111 mmol/L 85(L) 87(L) 88(L)  CO2  22 - 32 mmol/L 24 23 26   Calcium 8.9 - 10.3 mg/dL 8.5(L) 8.3(L) 8.2(L)    Hepatic Function Latest Ref Rng & Units 02/14/2019 02/12/2019 01/05/2019  Total Protein 6.5 - 8.1 g/dL 6.8 5.9(L) 6.0(L)  Albumin 3.5 - 5.0 g/dL 3.9 3.3(L) 3.3(L)  AST 15 - 41 U/L 25 13(L) 12(L)  ALT 0 - 44 U/L 20 12 13   Alk Phosphatase 38 - 126 U/L 144(H) 125 69  Total Bilirubin 0.3 - 1.2 mg/dL 1.4(H) 1.2 0.9  Bilirubin, Direct 0.0 - 0.2 mg/dL - 0.3(H) -    CBC Latest Ref Rng & Units 02/13/2019 02/13/2019 02/12/2019  WBC 4.0 - 10.5 K/uL 9.1 9.7 8.7  Hemoglobin 12.0 - 15.0 g/dL 8.6(L) 8.7(L)  8.2(L)  Hematocrit 36.0 - 46.0 % 29.1(L) 29.2(L) 27.9(L)  Platelets 150 - 400 K/uL 203 218 218    ABG    Component Value Date/Time   PHART 7.49 (H) 12/15/2018 0857   PCO2ART 42 12/15/2018 0857   PO2ART 101 12/15/2018 0857   HCO3 27.9 02/14/2019 0800   ACIDBASEDEF 2.1 (H) 02/14/2019 0800   O2SAT 38.9 02/14/2019 0800    CXR: Low lung volumes, edema pattern   IMPRESSION: Baseline frailty Hospitalized status x 3 months S/P ORIF R hip Poor progress in Rehab post hip repair Dilated cardiomyopathy Severe aortic stenosis S/P mechanical MV Pulmonary edema Chronic anticoagulation - excessively anticoagulated presently AKI with oliguria Hypervolemia Acute encephalopathy - likely TME related to uremia  DISCUSSION: Patient was transferred to ICU/SDU to provide high flow nasal cannula oxygen and consider HD catheter placement for dialysis.  I had a long discussion with the patient's daughter, [name] and his son, Helene Kelp.  I strongly discouraged initiating dialysis with my belief that dialyzing her would not significantly improve her likelihood of surviving to a functional status.  At first, they were unwilling to accept my assessment.  They felt strongly that they wanted to proceed with dialysis.  Notably, the patient is unable to contribute to this conversation due to encephalopathy.  Therefore, after discussion with vascular surgery and nephrology, I offered that I could perform a "high risk" (in light of her severe coagulopathy) HD catheter placement which ran a risk of significant bleeding.  I emphasized that complications might be beyond our ability to successfully treat or reverse.  As I was setting up to perform the procedure, patient's daughter came to me and informed me that she and her brother had discussed further and decided against dialysis.  Therefore, our focus will be primarily on patient's comfort    PLAN/REC: Continue supplemental oxygen for now with SPO2 goal greater than  90% Furosemide infusion will be continued for now and urine output monitored Low-dose morphine has been ordered to be used as needed for any form of discomfort including pain or dyspnea Patient is full DNR    CCM time: 60 mins The above time includes time spent in consultation with patient and/or family members and reviewing care plan with other members of the care team including nursing staff and physicians  Merton Border, MD PCCM service Mobile [phone] Pager [phone] 02/14/2019 12:50 PM

## 2019-02-14 NOTE — Progress Notes (Signed)
Report called to ICU nurse and patient transferred to ICU bed 1

## 2019-02-14 NOTE — Progress Notes (Signed)
   02/14/19 1140  Clinical Encounter Type  Visited With Patient and family together  Visit Type Follow-up  Referral From Nurse  Consult/Referral To Chaplain  Spiritual Encounters  Spiritual Needs Emotional  CH entered room and saw patient on hospital bed. Patient seemed restless as she grabbed and held son's hand. Patient was awake throughout visit but did not engage this [place]. Patient's son and daughter were present. Daughter appreciates pastoral visits from [facility]. Victor spent a few minutes building rapport. Provided emotional and spiritual support. Pastoral visit was appreciated.

## 2019-02-14 NOTE — Progress Notes (Signed)
Patient currently being transferred to CCU.  Has had a fair amount of volume with FFP, INR recalcitrant to treatment.  Currently 3.8 this morning.  Spoke with Dr. Darvin Neighbours, no family available to speak with currently.  INR still too high, ideally, to facilitate safe implantation of a hemodialysis device, would aim for 2.0 or less.  After speaking with Dr. Darvin Neighbours I agree that [name] has a very poor outcome regardless of therapeutic pathways here.  Palliative care may be more appropriate.  He will be speaking with the family  I will be available as needed.  No immediate plans for line insertion given above circumstance  Zara Chess, MD FACS [phone]

## 2019-02-14 NOTE — Progress Notes (Signed)
Progress Note  Patient Name: RAELEIGH VARAS Date of Encounter: 02/14/2019  Primary Cardiologist: Ida Rogue, MD   Subjective   Pt unable to provide any history this am.  She has been transferred to the ICU.  The patient is lethargic and not responding to questions this morning.  Family is at bedside.  Inpatient Medications    Scheduled Meds: . Chlorhexidine Gluconate Cloth  6 each Topical Q0600  . collagenase   Topical Daily  . mouth rinse  15 mL Mouth Rinse BID  . nystatin cream   Topical BID  . pantoprazole (PROTONIX) IV  40 mg Intravenous Q24H  . sodium chloride flush  3 mL Intravenous Q12H  . sodium chloride flush  3 mL Intravenous Q12H   Continuous Infusions: . sodium chloride    . furosemide (LASIX) infusion 10 mg/hr (02/14/19 0745)   PRN Meds: acetaminophen, acetaminophen, glycopyrrolate, ipratropium-albuterol, liver oil-zinc oxide, Melatonin, menthol-cetylpyridinium **OR** phenol, [DISCONTINUED] metoCLOPramide **OR** metoCLOPramide (REGLAN) injection, morphine injection, nystatin, [DISCONTINUED] ondansetron **OR** ondansetron (ZOFRAN) IV, sodium chloride flush   Vital Signs    Vitals:   02/14/19 0837 02/14/19 0919 02/14/19 0920 02/14/19 1000  BP:  117/78  128/90  Pulse:  80 69 67  Resp:  18 (!) 26 18  Temp:  97.6 F (36.4 C)    TempSrc:  Oral    SpO2: (!) 77% 90% 94% 97%  Weight:  62.5 kg    Height:        Intake/Output Summary (Last 24 hours) at 02/14/2019 1229 Last data filed at 02/14/2019 0710 Gross per 24 hour  Intake 758.17 ml  Output 150 ml  Net 608.17 ml   Last 3 Weights 02/14/2019 02/14/2019 02/12/2019  Weight (lbs) 137 lb 12.6 oz 132 lb 120 lb  Weight (kg) 62.5 kg 59.875 kg 54.432 kg  Some encounter information is confidential and restricted. Go to Review Flowsheets activity to see all data.      Telemetry    AV sequential pacing- Personally Reviewed   Physical Exam  Elderly woman, lethargic, does not appear to be in significant  distress GEN: No acute distress.   Neck: No JVD Cardiac: RRR, normal mechanical S1, 3/6 systolic murmur at the apex Respiratory:  Coarse breath sounds to auscultation bilaterally. GI: Soft, nontender, non-distended  MS:  Mild diffuse edema; No deformity. Neuro:  Nonfocal   Labs    High Sensitivity Troponin:  No results for input(s): TROPONINIHS in the last 720 hours.    Cardiac EnzymesNo results for input(s): TROPONINI in the last 168 hours. No results for input(s): TROPIPOC in the last 168 hours.   Chemistry Recent Labs  Lab 02/12/19 0847 02/13/19 1303 02/13/19 2103 02/14/19 0823  NA  --  126* 124* 124*  K  --  4.4 4.6 4.6  CL  --  88* 87* 85*  CO2  --  26 23 24   GLUCOSE  --  93 106* 100*  BUN  --  70* 72* 72*  CREATININE  --  3.29* 3.31* 3.59*  CALCIUM  --  8.2* 8.3* 8.5*  PROT 5.9*  --   --  6.8  ALBUMIN 3.3*  --   --  3.9  AST 13*  --   --  25  ALT 12  --   --  20  ALKPHOS 125  --   --  144*  BILITOT 1.2  --   --  1.4*  GFRNONAA  --  13* 12* 11*  GFRAA  --  15* 14* 13*  ANIONGAP  --  12 14 15      Hematology Recent Labs  Lab 02/12/19 0729 02/13/19 1109 02/13/19 2103  WBC 8.7 9.7 9.1  RBC 2.99* 3.15* 3.15*  HGB 8.2* 8.7* 8.6*  HCT 27.9* 29.2* 29.1*  MCV 93.3 92.7 92.4  MCH 27.4 27.6 27.3  MCHC 29.4* 29.8* 29.6*  RDW 18.1* 18.3* 18.4*  PLT 218 218 203    BNP Recent Labs  Lab 02/13/19 2104  BNP 3,063.0*     DDimer No results for input(s): DDIMER in the last 168 hours.   Radiology    Dg Chest Port 1 View  Result Date: 02/14/2019 CLINICAL DATA:  84 year old female with aspiration. EXAM: PORTABLE CHEST 1 VIEW COMPARISON:  Chest radiograph dated 02/13/2019 FINDINGS: There is shallow inspiration. Diffuse airspace densities throughout the lungs similar to prior radiograph. Small right pleural effusion again noted. No pneumothorax. Stable cardiomegaly. Left pectoral pacemaker device and median sternotomy wires. Atherosclerotic calcification of the  aorta. No acute osseous pathology. Osteopenia with multilevel degenerative changes of the spine and vertebroplasty. IMPRESSION: No significant interval change in the bilateral airspace densities and small right pleural effusion. Electronically Signed   By: Anner Crete M.D.   On: 02/14/2019 01:19   Dg Chest Port 1 View  Result Date: 02/13/2019 CLINICAL DATA:  Increasing edema and shortness of breath EXAM: PORTABLE CHEST 1 VIEW COMPARISON:  February 08, 2019 FINDINGS: The heart size is enlarged. The patient is status post prior median sternotomy. A multi lead left-sided pacemaker is noted. There is no acute osseous abnormality. The patient is status post multilevel vertebral augmentation. There is worsening vascular congestion and bilateral pleural effusions. There are more focal airspace opacities especially in the right upper lobe. There is no pneumothorax. IMPRESSION: 1. Cardiomegaly with findings concerning for worsening pulmonary edema. 2. More focal opacities in the right upper lobe can be seen in patients with pulmonary edema, however an atypical infectious process is not excluded. 3. Small to moderate-sized bilateral pleural effusions, right greater than left. Electronically Signed   By: Constance Holster M.D.   On: 02/13/2019 20:50    Patient Profile     84 y.o. female with persistent atrial fibrillation, chronic combined systolic and diastolic heart failure, nonischemic cardiomyopathy with history of CRT-P, and mitral valve disease status post mechanical mitral valve replacement.  She is admitted after mechanical fall with hip fracture in the setting of multiple chronic comorbidities.  Assessment & Plan    1.  Atrial tachycardia: Maintaining sinus rhythm, AV sequential pacing, telemetry reviewed this morning. 2.  Persistent atrial fibrillation, in sinus rhythm on amiodarone after cardioversion 3.  Acute on chronic systolic and diastolic heart failure: Clinical picture complicated by renal  failure.  Discussed with Dr. Holley Raring this morning.  Family now moving towards palliative care rather than temporary dialysis catheter placement.  She continues on an IV Lasix drip. 4.  Status post mechanical mitral valve replacement: The patient's INR remains supratherapeutic.  She has received FFP with a goal towards placing a temporary hemodialysis catheter.  However, goals of care have now changed.  INR should be monitored daily as it trends down into the therapeutic range. 5.  Goals of care: Lengthy discussion with the patient's family this morning.  They confirm wishes towards conservative care and do not want to pursue temporary dialysis.      For questions or updates, please contact [place] Please consult [email or web address] for contact info under  Signed, Sherren Mocha, MD  02/14/2019, 12:29 PM

## 2019-02-14 NOTE — Progress Notes (Signed)
[facility]  ROUNDING NOTE   Subjective:  Patient seen at bedside. Has had clinical deterioration. Currently on high flow nasal cannula. Critical care has seen the patient. Family has decided against dialysis at this time.   Objective:  Vital signs in last 24 hours:  Temp:  [97.4 F (36.3 C)-98.1 F (36.7 C)] 97.6 F (36.4 C) (08/16 0919) Pulse Rate:  [59-80] 67 (08/16 1000) Resp:  [16-30] 18 (08/16 1000) BP: (106-152)/(53-98) 128/90 (08/16 1000) SpO2:  [58 %-97 %] 97 % (08/16 1000) FiO2 (%):  [100 %] 100 % (08/16 0920) Weight:  [59.9 kg-62.5 kg] 62.5 kg (08/16 0919)  Weight change:  Filed Weights   02/12/19 0542 02/14/19 0424 02/14/19 0919  Weight: 54.4 kg 59.9 kg 62.5 kg    Intake/Output: I/O last 3 completed shifts: In: 589 [I.V.:413; Blood:176] Out: 251 [Urine:200; Drains:50; Stool:1]   Intake/Output this shift:  Total I/O In: 169.2 [Blood:169.2] Out: -   Physical Exam: General: Critically ill-appearing  Head: Normocephalic, atraumatic. Moist oral mucosal membranes, high flow nasal cannula on  Eyes: Anicteric  Neck: Supple, trachea midline  Lungs:  Bilateral crackles at bases, normal effort  Heart: Regular, ejection click heard  Abdomen:  Soft, nontender  Extremities: + peripheral edema, legs wrapped  Neurologic: Awake, unable to verbalize  Skin: No lesions  Access: none    Basic Metabolic Panel: Recent Labs  Lab 02/09/19 0508 02/10/19 0613 02/25/2019 0547 02/12/19 0730 02/13/19 1303 02/13/19 2103 02/14/19 0823  NA 131* 135 131* 129* 126* 124* 124*  K 4.0 4.1 4.2 4.3 4.4 4.6 4.6  CL 91* 94* 91* 91* 88* 87* 85*  CO2 26 25 25 25 26 23 24   GLUCOSE 97 88 88 77 93 106* 100*  BUN 51* 58* 64* 62* 70* 72* 72*  CREATININE 2.69* 2.84* 3.00* 2.91* 3.29* 3.31* 3.59*  CALCIUM 8.7* 8.6* 8.2* 8.0* 8.2* 8.3* 8.5*  MG 2.4 2.6*  --   --   --   --   --     Liver Function Tests: Recent Labs  Lab 02/12/19 0847 02/14/19 0823  AST 13* 25  ALT  12 20  ALKPHOS 125 144*  BILITOT 1.2 1.4*  PROT 5.9* 6.8  ALBUMIN 3.3* 3.9   No results for input(s): LIPASE, AMYLASE in the last 168 hours. No results for input(s): AMMONIA in the last 168 hours.  CBC: Recent Labs  Lab 02/08/19 0405  02/10/19 [id number] 02/14/2019 0547 02/12/19 0729 02/13/19 1109 02/13/19 2103  WBC 9.0   < > 8.8 8.1 8.7 9.7 9.1  NEUTROABS 7.7  --   --   --   --   --   --   HGB 8.7*   < > 9.8* 9.0* 8.2* 8.7* 8.6*  HCT 29.9*   < > 32.6* 30.6* 27.9* 29.2* 29.1*  MCV 96.5   < > 95.0 95.9 93.3 92.7 92.4  PLT 299   < > 270 242 218 218 203   < > = values in this interval not displayed.    Cardiac Enzymes: No results for input(s): CKTOTAL, CKMB, CKMBINDEX, TROPONINI in the last 168 hours.  BNP: Invalid input(s): POCBNP  CBG: Recent Labs  Lab 02/14/19 0916  GLUCAP 111*    Microbiology: Results for orders placed or performed during the hospital encounter of 01/11/2019  Novel Coronavirus,NAA,(SEND-OUT TO REF LAB - TAT 24-48 hrs); Hosp Order     Status: None   Collection Time: 01/19/19 12:10 AM   Specimen: Nasopharyngeal Swab; Respiratory  Result Value Ref Range Status   SARS-CoV-2, NAA NOT DETECTED NOT DETECTED Final    Comment: (NOTE) This test was developed and its performance characteristics determined by [employer]. This test has not been FDA cleared or approved. This test has been authorized by FDA under an Emergency Use Authorization (EUA). This test is only authorized for the duration of time the declaration that circumstances exist justifying the authorization of the emergency use of in vitro diagnostic tests for detection of SARS-CoV-2 virus and/or diagnosis of COVID-19 infection under section 564(b)(1) of the Act, 21 U.S.C. KA:123727), unless the authorization is terminated or revoked sooner. When diagnostic testing is negative, the possibility of a false negative result should be considered in the context of a patient's recent exposures  and the presence of clinical signs and symptoms consistent with COVID-19. An individual without symptoms of COVID-19 and who is not shedding SARS-CoV-2 virus would expect to have a negative (not detected) result in this assay. Performed  At: [facility] [address] [place], [place] [id number] Rush Farmer MD [id number]    [place]  Final    Comment: Performed at [facility], [address] [place], [place] [address]  Urine Culture     Status: Abnormal   Collection Time: 01/31/19  2:18 PM   Specimen: Urine, Random  Result Value Ref Range Status   Specimen Description   Final    URINE, RANDOM Performed at [facility], [address] [place], [place] [address]    Special Requests   Final    NONE Performed at [facility], [address] [place], [place] [address]    Culture >=100,000 COLONIES/mL ENTEROCOCCUS FAECALIS (A)  Final   Report Status 02/02/2019 FINAL  Final   Organism ID, Bacteria ENTEROCOCCUS FAECALIS (A)  Final      Susceptibility   Enterococcus faecalis - MIC*    AMPICILLIN <=2 SENSITIVE Sensitive     LEVOFLOXACIN 0.5 SENSITIVE Sensitive     NITROFURANTOIN <=16 SENSITIVE Sensitive     VANCOMYCIN 1 SENSITIVE Sensitive     * >=100,000 COLONIES/mL ENTEROCOCCUS FAECALIS  CULTURE, BLOOD (ROUTINE X 2) w Reflex to ID Panel     Status: None (Preliminary result)   Collection Time: 02/08/2019  5:42 AM   Specimen: BLOOD  Result Value Ref Range Status   Specimen Description BLOOD RIGHT ANTECUBITAL  Final   Special Requests   Final    BOTTLES DRAWN AEROBIC AND ANAEROBIC Blood Culture adequate volume   Culture   Final    NO GROWTH 3 DAYS Performed at [facility], [address], [place], [place] [address]    Report Status PENDING  Incomplete  CULTURE, BLOOD (ROUTINE X 2) w Reflex to ID Panel     Status: None (Preliminary result)   Collection Time: 01/30/2019  7:16 AM   Specimen: BLOOD   Result Value Ref Range Status   Specimen Description BLOOD RIGHT [facility]  Final   Special Requests   Final    BOTTLES DRAWN AEROBIC AND ANAEROBIC Blood Culture adequate volume   Culture   Final    NO GROWTH 3 DAYS Performed at [facility], [address] [place], [place] [address]    Report Status PENDING  Incomplete  MRSA PCR Screening     Status: None   Collection Time: 02/14/19  9:23 AM   Specimen: Nasal Mucosa; Nasopharyngeal  Result Value Ref Range Status   MRSA by PCR NEGATIVE NEGATIVE Final  Comment:        The GeneXpert MRSA Assay (FDA approved for NASAL specimens only), is one component of a comprehensive MRSA colonization surveillance program. It is not intended to diagnose MRSA infection nor to guide or monitor treatment for MRSA infections. Performed at [facility], [address] [place], [place] [address]     Coagulation Studies: Recent Labs    02/12/19 [id number] 02/13/19 1249 02/13/19 2103 02/14/19 0823  LABPROT 55.8* 39.5* 43.5* 37.1*  INR 6.5* 4.2* 4.7* 3.8*    Urinalysis: Recent Labs    02/12/2019 1849  COLORURINE YELLOW*  LABSPEC 1.012  PHURINE 5.0  GLUCOSEU NEGATIVE  HGBUR LARGE*  BILIRUBINUR NEGATIVE  KETONESUR NEGATIVE  PROTEINUR 100*  NITRITE NEGATIVE  LEUKOCYTESUR LARGE*      Imaging: Dg Chest Port 1 View  Result Date: 02/14/2019 CLINICAL DATA:  84 year old female with aspiration. EXAM: PORTABLE CHEST 1 VIEW COMPARISON:  Chest radiograph dated 02/13/2019 FINDINGS: There is shallow inspiration. Diffuse airspace densities throughout the lungs similar to prior radiograph. Small right pleural effusion again noted. No pneumothorax. Stable cardiomegaly. Left pectoral pacemaker device and median sternotomy wires. Atherosclerotic calcification of the aorta. No acute osseous pathology. Osteopenia with multilevel degenerative changes of the spine and vertebroplasty. IMPRESSION: No significant interval change in the bilateral  airspace densities and small right pleural effusion. Electronically Signed   By: Anner Crete M.D.   On: 02/14/2019 01:19   Dg Chest Port 1 View  Result Date: 02/13/2019 CLINICAL DATA:  Increasing edema and shortness of breath EXAM: PORTABLE CHEST 1 VIEW COMPARISON:  February 08, 2019 FINDINGS: The heart size is enlarged. The patient is status post prior median sternotomy. A multi lead left-sided pacemaker is noted. There is no acute osseous abnormality. The patient is status post multilevel vertebral augmentation. There is worsening vascular congestion and bilateral pleural effusions. There are more focal airspace opacities especially in the right upper lobe. There is no pneumothorax. IMPRESSION: 1. Cardiomegaly with findings concerning for worsening pulmonary edema. 2. More focal opacities in the right upper lobe can be seen in patients with pulmonary edema, however an atypical infectious process is not excluded. 3. Small to moderate-sized bilateral pleural effusions, right greater than left. Electronically Signed   By: Constance Holster M.D.   On: 02/13/2019 20:50     Medications:   . sodium chloride    . furosemide (LASIX) infusion 10 mg/hr (02/14/19 0745)   . Chlorhexidine Gluconate Cloth  6 each Topical Q0600  . collagenase   Topical Daily  . mouth rinse  15 mL Mouth Rinse BID  . nystatin cream   Topical BID  . pantoprazole (PROTONIX) IV  40 mg Intravenous Q24H  . sodium chloride flush  3 mL Intravenous Q12H  . sodium chloride flush  3 mL Intravenous Q12H   acetaminophen, acetaminophen, glycopyrrolate, ipratropium-albuterol, liver oil-zinc oxide, Melatonin, menthol-cetylpyridinium **OR** phenol, [DISCONTINUED] metoCLOPramide **OR** metoCLOPramide (REGLAN) injection, morphine injection, nystatin, [DISCONTINUED] ondansetron **OR** ondansetron (ZOFRAN) IV, sodium chloride flush  Assessment/ Plan:  [name] is a 84 y.o. white female with congestive heart failure EF 25%,  hypertension, atrial fibrillation, lymphedema, mitral valve replacement (mechanical valve) requiring chronic anticoagulation, COPD, osteoporosis, history of multiple lumbar compression fractures requiring kyphoplasty, history of left leg hematoma with evacuation and porcine allograft, was admitted on 12/25/2018 with right hip fracture.   1.  Acute kidney injury, chronic kidney disease stage III.  Baseline 1.37/GFR 36 from December 15, 2018 Acute renal failure may be secondary to  vancomycin usage. Previously required renal replacement therapy.  -Patient showing signs of deterioration.  Urine output down to 100 cc/h.  INR has remained high despite multiple units of FFP.  Family has now decided against renal replacement therapy.  Would recommend consideration of comfort care.  2.  Anemia of chronic kidney disease.  Hemoglobin currently 8.6.  No indication for Procrit at the moment.  3.  Chronic systolic congestive heart failure with EF 25%.  Urine output down significantly to 100 cc/h.  Maintain the patient on furosemide drip.  4.  Pulmonary hypertension with respiratory failure: Patient now transitioned to high flow nasal cannula.  5.  Mechanical heart valve requiring chronic anticoagulation.  INR still high at 3.8 despite multiple units of FFP.   LOS: 27 Ryu Cerreta 8/16/20201:38 PM

## 2019-02-14 NOTE — Progress Notes (Signed)
[place] [facility] [place] NAME: [name]    MR#:  [id number]  DATE OF BIRTH:  April 23, 1935  SUBJECTIVE:  CHIEF COMPLAINT:  No chief complaint on file.  Patient is drowsy and confused.  Tachypneic.  On a nonrebreather.  Received 2 units FFP overnight  REVIEW OF SYSTEMS:  Review of Systems  Unable to perform ROS: Acuity of condition   DRUG ALLERGIES:   Allergies  Allergen Reactions  . Penicillins Rash    ** daughter reports facial swelling**   Has patient had a PCN reaction causing immediate rash, facial/tongue/throat swelling, SOB or lightheadedness with hypotension: YES Has patient had a PCN reaction causing severe rash involving mucus membranes or skin necrosis: NO Has patient had a PCN reaction that required hospitalization NO Has patient had a PCN reaction occurring within the last 10 years: NO If all of the above answers are "NO", then may proceed with Cephalosporin use.  . Ace Inhibitors     Cough  . Doxycycline Nausea Only  . Hydrocodone Itching and Nausea Only  . Mercury     Other reaction(s): Unknown  . Silver Dermatitis    Severe itching  . Cephalexin Rash  . Clindamycin/Lincomycin Rash  . Nickel Rash   VITALS:  Blood pressure 128/90, pulse 67, temperature 97.6 F (36.4 C), temperature source Oral, resp. rate 18, height 4\' 11"  (1.499 m), weight 62.5 kg, SpO2 97 %. PHYSICAL EXAMINATION:  Physical Exam  General patient chronically ill-appearing currently lethargic HENT:  Nose: No mucosal edema.  Mouth/Throat: No oropharyngeal exudate or posterior oropharyngeal edema.  Eyes: Pupils are equal, round, and reactive to light. Conjunctivae,  Neck: No JVD present. Carotid bruit is not present. No edema present. No thyroid mass and no thyromegaly present.  Cardiovascular: S1 normal and S2 normal.  Systolic murmur Respiratory: No respiratory distress.no wheezing.  No rales  GI: Soft. Bowel sounds are normal. There is  no abdominal tenderness.  Musculoskeletal:     Right ankle: She exhibits swelling.     Left ankle: She exhibits swelling.     Comments: Right hip status post surgery with wound VAC and edema  Lymphadenopathy:    She has no cervical adenopathy.  Neurological: Lethargic Skin: Skin is warm. Nails show no clubbing.  Sacral decubitus ulcer now unstageable Psychiatric: Lethargic  Foley catheter in place, skin lesions on leg and sacrum.  LABORATORY PANEL:  Female CBC Recent Labs  Lab 02/13/19 2103  WBC 9.1  HGB 8.6*  HCT 29.1*  PLT 203   ------------------------------------------------------------------------------------------------------------------ Chemistries  Recent Labs  Lab 02/10/19 0613  02/14/19 0823  NA 135   < > 124*  K 4.1   < > 4.6  CL 94*   < > 85*  CO2 25   < > 24  GLUCOSE 88   < > 100*  BUN 58*   < > 72*  CREATININE 2.84*   < > 3.59*  CALCIUM 8.6*   < > 8.5*  MG 2.6*  --   --   AST  --    < > 25  ALT  --    < > 20  ALKPHOS  --    < > 144*  BILITOT  --    < > 1.4*   < > = values in this interval not displayed.   RADIOLOGY:  Dg Chest Port 1 View  Result Date: 02/14/2019 CLINICAL DATA:  84 year old female with aspiration. EXAM: PORTABLE CHEST 1 VIEW  COMPARISON:  Chest radiograph dated 02/13/2019 FINDINGS: There is shallow inspiration. Diffuse airspace densities throughout the lungs similar to prior radiograph. Small right pleural effusion again noted. No pneumothorax. Stable cardiomegaly. Left pectoral pacemaker device and median sternotomy wires. Atherosclerotic calcification of the aorta. No acute osseous pathology. Osteopenia with multilevel degenerative changes of the spine and vertebroplasty. IMPRESSION: No significant interval change in the bilateral airspace densities and small right pleural effusion. Electronically Signed   By: Anner Crete M.D.   On: 02/14/2019 01:19   Dg Chest Port 1 View  Result Date: 02/13/2019 CLINICAL DATA:  Increasing edema  and shortness of breath EXAM: PORTABLE CHEST 1 VIEW COMPARISON:  February 08, 2019 FINDINGS: The heart size is enlarged. The patient is status post prior median sternotomy. A multi lead left-sided pacemaker is noted. There is no acute osseous abnormality. The patient is status post multilevel vertebral augmentation. There is worsening vascular congestion and bilateral pleural effusions. There are more focal airspace opacities especially in the right upper lobe. There is no pneumothorax. IMPRESSION: 1. Cardiomegaly with findings concerning for worsening pulmonary edema. 2. More focal opacities in the right upper lobe can be seen in patients with pulmonary edema, however an atypical infectious process is not excluded. 3. Small to moderate-sized bilateral pleural effusions, right greater than left. Electronically Signed   By: Constance Holster M.D.   On: 02/13/2019 20:50   ASSESSMENT AND PLAN:   *Acute on chronic respiratory failure secondary to pulmonary edema Patient on nonrebreather.  Will place her on high flow nasal cannula.  Discussed with daughter over the phone.  Will transfer to ICU.  Daughter has requested we hold off on the dialysis catheter till she sees her and makes decisions. Discussed with ICU attending Dr. Alva Garnet  * Acute encephalopathy - multifactorial Patient's prognosis is very poor No signs of infection. Uremia likely contributing  * Acute on chronic combined systolic and diastolic heart failure.   Continue IV Lasix.  Not diuresing well. Significant worsening today.  Transferred to ICU.  * Paroxysmal atrial fibrillation with mitral valve replacement Status post cardioversion and a normal sinus rhythm today Coumadin held for hemodialysis catheter placement.    * Acute blood loss anemia secondary to large right hip hematoma and skin tear right leg.  Also has other hematomas on the legs. Hemoglobin stable. Monitor.  * Hip fracture status post operative repair at [facility].  With  hematoma. physical therapy recommending rehab.  Family wants patient to go home once clinically more stable and has enough endurance and strength.  Do not want SNF placement.  Continue wound VAC, care per orhtopedics   * AKI on Chronic kidney disease stage III.  With cardiorenal component and vancomycin. Plan was to start hemodialysis.  Presently on hold per daughter's request. INR pending for Temp HD cath placement  * sacral decubiti-unstageable with some tunneling.  Newly developing right heel pressure ulcer pressure pads applied.   Surgery following patient.  wound care.  Encouraged to reposition patient frequently-every 1-2 hours  Patient nutrition is very poor.  NG tube for feeding recommended by Dr. Posey Pronto.  Family refused.  CODE STATUS: DNR  TOTAL CRITICAL CARE TIME TAKING CARE OF THIS PATIENT: 37 minutes.   [name] M.D on 02/14/2019 at 10:46 AM  Between 7am to 6pm - Pager - [phone]  After 6pm go to [email or web address] - Proofreader  [facility]  Office  [phone]  CC: Primary care physician; Rusty Aus, MD  Note: This dictation was prepared with Dragon dictation along with smaller phrase technology. Any transcriptional errors that result from this process are unintentional.

## 2019-02-14 NOTE — Progress Notes (Signed)
   CRITICAL CARE NOTE      Family Conference   I met with Helene Kelp and [name] to discuss patient's condition and continued clinical worsening.  We discussed her chronic medical conditions and hospitalizations over past 4 years.  Her past year has been mostly in and out of hospitals and recently severely complicated involving multiple intensive care admissions.  We have reviewed her most recent challenges with worsening heart failure, kidney failure requiring dialysis on previous MICU stay here and and [facility], her worsening mentation, deep unstageable sacral decubitus ulcer, hip fracuture, acute blood loss anemia requiring multiple transfusions previously, now again with hematoma and supratheraputic INR from [facility] required for mechanical heart valve, additionally she is developing toxic metabolic encephalopathy, and enterococcal urinary infection.  They understand this degree of severity and numerous comorbid conditions it is potentially futile to continue aggressive care.  They have made decision to not escalate her care from this point.  They share that mother had told them she was at peace and saw God which helped them make the decision to deescalate her care. I agree with their decision and support care plan as outlined by Dr Alva Garnet as well as transitioning to comfort measures when family is ready.            Ottie Glazier, M.D.  Division of [facility]

## 2019-02-14 NOTE — Progress Notes (Signed)
RT called to assess the patient after patient sats dropped to as low as 49 percent, and maintained sats in the 60s. Pt was placed on non-rebreather at 15 lpm and sats increased to the 90s. Dr. Darvin Neighbours was called and updated. Dr. Darvin Neighbours stated he will come assess the patient and update the family.

## 2019-02-14 NOTE — Progress Notes (Signed)
Wound care completed to bilateral legs. Patient son and daughter at bedside for dressing change. Two sutures were noticed at right hip. Sutures were shown to MD Simonds. Sutures were grown underneath new skin and no success with removing sutures. Daughter asked to not remove sutures related to potential discomfort.

## 2019-02-14 NOTE — Progress Notes (Signed)
RT called by RN reference to patient desturation.  Patient SpO2 60% on 6lpm nasal cannula.  LS coarse with moderate retractions and tachypnea noted.  Patient placed on NRB at 15 lpm with increase in SpO2 to 92%.  Discussed with RN who is to notified MD of current assessment.

## 2019-02-15 LAB — CBC
HCT: 29 % — ABNORMAL LOW (ref 36.0–46.0)
Hemoglobin: 8.5 g/dL — ABNORMAL LOW (ref 12.0–15.0)
MCH: 27.5 pg (ref 26.0–34.0)
MCHC: 29.3 g/dL — ABNORMAL LOW (ref 30.0–36.0)
MCV: 93.9 fL (ref 80.0–100.0)
Platelets: 178 10*3/uL (ref 150–400)
RBC: 3.09 MIL/uL — ABNORMAL LOW (ref 3.87–5.11)
RDW: 18.7 % — ABNORMAL HIGH (ref 11.5–15.5)
WBC: 10.9 10*3/uL — ABNORMAL HIGH (ref 4.0–10.5)
nRBC: 0.4 % — ABNORMAL HIGH (ref 0.0–0.2)

## 2019-02-15 LAB — PREPARE FRESH FROZEN PLASMA
Unit division: 0
Unit division: 0

## 2019-02-15 LAB — BASIC METABOLIC PANEL
Anion gap: 13 (ref 5–15)
BUN: 80 mg/dL — ABNORMAL HIGH (ref 8–23)
CO2: 26 mmol/L (ref 22–32)
Calcium: 8.1 mg/dL — ABNORMAL LOW (ref 8.9–10.3)
Chloride: 87 mmol/L — ABNORMAL LOW (ref 98–111)
Creatinine, Ser: 4.03 mg/dL — ABNORMAL HIGH (ref 0.44–1.00)
GFR calc Af Amer: 11 mL/min — ABNORMAL LOW (ref >60)
GFR calc non Af Amer: 10 mL/min — ABNORMAL LOW (ref >60)
Glucose, Bld: 79 mg/dL (ref 70–99)
Potassium: 5.2 mmol/L — ABNORMAL HIGH (ref 3.5–5.1)
Sodium: 126 mmol/L — ABNORMAL LOW (ref 135–145)

## 2019-02-15 LAB — BPAM FFP
Blood Product Expiration Date: 202008202359
Blood Product Expiration Date: 202008202359
ISSUE DATE / TIME: 202008160001
ISSUE DATE / TIME: 202008160359
Unit Type and Rh: 6200
Unit Type and Rh: 6200

## 2019-02-15 LAB — PROTIME-INR
INR: 5.6 (ref 0.8–1.2)
Prothrombin Time: 49.5 seconds — ABNORMAL HIGH (ref 11.4–15.2)

## 2019-02-15 LAB — PHOSPHORUS: Phosphorus: 8.5 mg/dL — ABNORMAL HIGH (ref 2.5–4.6)

## 2019-02-15 LAB — MAGNESIUM: Magnesium: 2.8 mg/dL — ABNORMAL HIGH (ref 1.7–2.4)

## 2019-02-15 MED ORDER — HALOPERIDOL LACTATE 5 MG/ML IJ SOLN: 0.5000 mg | INTRAMUSCULAR | Status: DC | PRN

## 2019-02-15 MED ORDER — HALOPERIDOL 0.5 MG PO TABS
0.5000 mg | ORAL_TABLET | ORAL | Status: DC | PRN
  Filled 2019-02-15: qty 1

## 2019-02-15 MED ORDER — GLYCOPYRROLATE 0.2 MG/ML IJ SOLN: 0.2000 mg | INTRAMUSCULAR | Status: DC | PRN

## 2019-02-15 MED ORDER — MORPHINE SULFATE (PF) 2 MG/ML IV SOLN
1.0000 mg | INTRAVENOUS | Status: DC | PRN
  Filled 2019-02-15: qty 1

## 2019-02-15 MED ORDER — HALOPERIDOL LACTATE 2 MG/ML PO CONC
0.5000 mg | ORAL | Status: DC | PRN
  Filled 2019-02-15: qty 0.3

## 2019-02-15 MED ORDER — GLYCOPYRROLATE 1 MG PO TABS
1.0000 mg | ORAL_TABLET | ORAL | Status: DC | PRN
  Filled 2019-02-15: qty 1

## 2019-02-15 MED ORDER — POLYVINYL ALCOHOL 1.4 % OP SOLN
1.0000 [drp] | Freq: Four times a day (QID) | OPHTHALMIC | Status: DC | PRN
  Filled 2019-02-15: qty 15

## 2019-02-15 MED ORDER — BIOTENE DRY MOUTH MT LIQD: 15.0000 mL | OROMUCOSAL | Status: DC | PRN

## 2019-02-16 ENCOUNTER — Ambulatory Visit: Payer: Medicare Other | Admitting: Family

## 2019-02-16 LAB — CULTURE, BLOOD (ROUTINE X 2)
Culture: NO GROWTH
Culture: NO GROWTH
Special Requests: ADEQUATE
Special Requests: ADEQUATE

## 2019-02-17 ENCOUNTER — Telehealth: Payer: Self-pay | Admitting: Pulmonary Disease

## 2019-02-17 NOTE — Telephone Encounter (Signed)
Death certificate has been placed in DS folder.   

## 2019-02-18 NOTE — Telephone Encounter (Signed)
Death certificate has been completed and placed up front for pickup.  [name] is aware and voiced his understanding. Nothing further is needed.

## 2019-03-02 NOTE — Progress Notes (Signed)
[employer] contacted about patient expiring and they stated the pacemaker does not get turned off.

## 2019-03-02 NOTE — Progress Notes (Signed)
Met with patient's daughter [name] in the room to discuss patient's change in condition.  Briefly, over the course of the night, patient's blood pressure has been progressively declining as well as her SPO2 and mental status.  Currently patient is obtunded with agonal respirations on 100% FiO2 via high flow nasal cannula.  She is still on a Lasix drip at 10 mg/h.  Infusion rate decreased to 5 mg/h without any improvement in blood pressure.  After discussion with daughter, she indicated that patient has suffered enough and she is ready to transition her to full comfort care.  Patient taken off high flow and placed on 2 L nasal cannula.  1 dose of morphine administered.  Lasix infusion discontinued.  End-of-life care orders placed.  We will continue to monitor and titrate comfort medications.  Spiritual care offered but declined.  Support provided.  Benett Swoyer S. Tukov-Yual ANP-BC Pulmonary and [place] Pager [phone] or [phone]  NB: This document was prepared using Dragon voice recognition software and may include unintentional dictation errors.

## 2019-03-02 NOTE — Death Summary Note (Signed)
DEATH SUMMARY   Patient Details  Name: [name] MRN: [id number] DOB: [birth year]  Admission/Discharge Information   Admit Date:  19-[name]-2019  Date of Death: Date of Death: 02-16-2019  Time of Death: Time of Death: 0800  Length of Stay: 09-27-22  Referring Physician: Rusty Aus, MD   Reason(s) for Hospitalization  Post op management after ORIF R hip (performed @ [facility])  Diagnoses  Preliminary cause of death:   Acute hypoxemic respiratory failure due to pulmonary edema due to ischemic cardiomyopathy and severe aortic stenosis Secondary Diagnoses (including complications and co-morbidities):  Active Problems:   Chronic systolic heart failure (HCC)   Paroxysmal atrial fibrillation (HCC)   Hip fracture requiring operative repair ([place])   Protein-calorie malnutrition, severe S/P mitral valve replacement Severe deconditioning and frailty Decubitus ulcers Acute kidney injury Coagulopathy due to warfarin Acute encephalopathy   Brief Hospital Course (including significant findings, care, treatment, and services provided and events leading to death)  [name] was an 84 y.o. with severe systolic CHF, severe aortic stenosis, mechanical mitral valve, chronic atrial fibrillation, PPM. She had endured 3 hospitalizations @ [facility] in May and June of this year.  After 1 of the hospitalizations, she required discharge to nursing home.    She returned to [facility] 6/26 after a fall with R hip fracture.  She was felt to be too high risk for repair at this hospital and was transferred to [facility] for ORIF.    She was transferred back to [facility] 2023-01-19.    Her hospital course was complicated by minimal progression with physical therapy, development of pressure ulcers, progressive renal insufficiency and pulmonary edema.    She was transferred to ICU/SDU 8/16 requiring HFNC with pulmonary edema pattern on CXR.  At the time of transfer, the family was desiring and expecting HD catheter placement and initiation  of hemodialysis.  At time of transfer, patient was poorly oriented and unable to provide significant history.    After much conversation on the day of transfer to the ICU/SDU, it was ultimately decided that no catheter placement and dialysis would be undertaken.  The patient was continued on high flow nasal cannula oxygen with a furosemide infusion.  During the night of 8/16 she developed increasing respiratory distress and was transitioned to comfort care.  She passed away peacefully on the morning of 817 at 8 AM.  Pertinent Labs and Studies  Significant Diagnostic Studies Dg Sacrum/coccyx  Result Date: 02/09/2019 CLINICAL DATA:  Evaluate for sacral/decubitus ulcer EXAM: SACRUM AND COCCYX - 2+ VIEW COMPARISON:  None. FINDINGS: Markedly suboptimal evaluation of the sacrum and coccyx given extensive overlying bowel gas, coarse uterine fibroids, and poor penetration on lateral radiograph due to body habitus. Prior vertebral and sacral augmentations are noted. No acute fracture is evident. Extensive vascular calcium is noted in the pelvis. IMPRESSION: Suboptimal assessment of the sacrum and coccyx due to difficulties with patient positioning and body habitus. Consider contrast-enhanced CT imaging for further evaluation. Electronically Signed   By: Lovena Le M.D.   On: 02/08/2019 19:40   Dg Chest Port 1 View  Result Date: 02/14/2019 CLINICAL DATA:  84 year old female with aspiration. EXAM: PORTABLE CHEST 1 VIEW COMPARISON:  Chest radiograph dated 02/13/2019 FINDINGS: There is shallow inspiration. Diffuse airspace densities throughout the lungs similar to prior radiograph. Small right pleural effusion again noted. No pneumothorax. Stable cardiomegaly. Left pectoral pacemaker device and median sternotomy wires. Atherosclerotic calcification of the aorta. No acute osseous pathology. Osteopenia with multilevel degenerative changes  of the spine and vertebroplasty. IMPRESSION: No significant interval change in  the bilateral airspace densities and small right pleural effusion. Electronically Signed   By: Anner Crete M.D.   On: 02/14/2019 01:19   Dg Chest Port 1 View  Result Date: 02/13/2019 CLINICAL DATA:  Increasing edema and shortness of breath EXAM: PORTABLE CHEST 1 VIEW COMPARISON:  February 08, 2019 FINDINGS: The heart size is enlarged. The patient is status post prior median sternotomy. A multi lead left-sided pacemaker is noted. There is no acute osseous abnormality. The patient is status post multilevel vertebral augmentation. There is worsening vascular congestion and bilateral pleural effusions. There are more focal airspace opacities especially in the right upper lobe. There is no pneumothorax. IMPRESSION: 1. Cardiomegaly with findings concerning for worsening pulmonary edema. 2. More focal opacities in the right upper lobe can be seen in patients with pulmonary edema, however an atypical infectious process is not excluded. 3. Small to moderate-sized bilateral pleural effusions, right greater than left. Electronically Signed   By: Constance Holster M.D.   On: 02/13/2019 20:50   Dg Chest Port 1 View  Result Date: 02/08/2019 CLINICAL DATA:  Short of breath. EXAM: PORTABLE CHEST 1 VIEW COMPARISON:  01/19/2019 and earlier exams. FINDINGS: Bilateral interstitial and hazy airspace opacities appear increased compared to the prior study, although this apparent change may be due to the lower lung volumes on the current study. Possible small effusions.  No evidence of a pneumothorax. There stable changes from previous cardiac surgery and valve replacement. Left anterior chest wall biventricular cardioverter-defibrillator is stable. IMPRESSION: 1. Bilateral interstitial and hazy airspace opacities. Lung appearance is similar to the most recent prior exam allowing for lower lung volumes on the current exam. Suspect small effusions. Etiology of the lung findings is likely due to congestive heart failure.  Electronically Signed   By: Lajean Manes M.D.   On: 02/08/2019 19:41   Dg Chest Port 1 View  Result Date: 01/19/2019 CLINICAL DATA:  Dyspnea. EXAM: PORTABLE CHEST 1 VIEW COMPARISON:  Radiograph 01/05/2019 FINDINGS: Left-sided pacemaker remains in place. Post median sternotomy. Prostatic valve. Cardiomegaly is unchanged. Mediastinal contours unchanged. Improved bilateral pulmonary opacities. Residual bilateral perihilar reticular opacities. Linear opacity in the right mid lung may be fluid in the fissure or atelectasis/airspace disease. Retrocardiac opacity with possible left pleural effusion. Vertebral augmentation in the midthoracic spine. IMPRESSION: 1. Improved bilateral pulmonary opacities since prior exam. Residual reticular opacities may represent pulmonary edema or residual infection. 2. Possible left pleural effusion and basilar consolidation. 3. Right midlung atelectasis or fluid in the fissure. Electronically Signed   By: Keith Rake M.D.   On: 01/19/2019 03:16    Microbiology Recent Results (from the past 240 hour(s))  CULTURE, BLOOD (ROUTINE X 2) w Reflex to ID Panel     Status: None (Preliminary result)   Collection Time: 02/02/2019  5:42 AM   Specimen: BLOOD  Result Value Ref Range Status   Specimen Description BLOOD RIGHT ANTECUBITAL  Final   Special Requests   Final    BOTTLES DRAWN AEROBIC AND ANAEROBIC Blood Culture adequate volume   Culture   Final    NO GROWTH 4 DAYS Performed at [facility], [address] [place], [place] [address]    Report Status PENDING  Incomplete  CULTURE, BLOOD (ROUTINE X 2) w Reflex to ID Panel     Status: None (Preliminary result)   Collection Time: 02/22/2019  7:16 AM   Specimen: BLOOD  Result Value Ref  Range Status   Specimen Description BLOOD RIGHT [facility]  Final   Special Requests   Final    BOTTLES DRAWN AEROBIC AND ANAEROBIC Blood Culture adequate volume   Culture   Final    NO GROWTH 4 DAYS Performed at [facility] [address] [place], [place] [address]    Report Status PENDING  Incomplete  MRSA PCR Screening     Status: None   Collection Time: 02/14/19  9:23 AM   Specimen: Nasal Mucosa; Nasopharyngeal  Result Value Ref Range Status   MRSA by PCR NEGATIVE NEGATIVE Final    Comment:        The GeneXpert MRSA Assay (FDA approved for NASAL specimens only), is one component of a comprehensive MRSA colonization surveillance program. It is not intended to diagnose MRSA infection nor to guide or monitor treatment for MRSA infections. Performed at [facility], [address] [place], [place] [address]     Lab Basic Metabolic Panel: Recent Labs  Lab 02/09/19 [phone] 02/10/19 [id number]  02/12/19 0730 02/13/19 1303 02/13/19 2103 02/14/19 0823 2019/02/27 0458  NA 131* 135   < > 129* 126* 124* 124* 126*  K 4.0 4.1   < > 4.3 4.4 4.6 4.6 5.2*  CL 91* 94*   < > 91* 88* 87* 85* 87*  CO2 26 25   < > 25 26 23 24 26   GLUCOSE 97 88   < > 77 93 106* 100* 79  BUN 51* 58*   < > 62* 70* 72* 72* 80*  CREATININE 2.69* 2.84*   < > 2.91* 3.29* 3.31* 3.59* 4.03*  CALCIUM 8.7* 8.6*   < > 8.0* 8.2* 8.3* 8.5* 8.1*  MG 2.4 2.6*  --   --   --   --   --  2.8*  PHOS  --   --   --   --   --   --   --  8.5*   < > = values in this interval not displayed.   Liver Function Tests: Recent Labs  Lab 02/12/19 0847 02/14/19 0823  AST 13* 25  ALT 12 20  ALKPHOS 125 144*  BILITOT 1.2 1.4*  PROT 5.9* 6.8  ALBUMIN 3.3* 3.9   No results for input(s): LIPASE, AMYLASE in the last 168 hours. No results for input(s): AMMONIA in the last 168 hours. CBC: Recent Labs  Lab 02/26/2019 0547 02/12/19 0729 02/13/19 1109 02/13/19 2103 2019/02/27 0458  WBC 8.1 8.7 9.7 9.1 10.9*  HGB 9.0* 8.2* 8.7* 8.6* 8.5*  HCT 30.6* 27.9* 29.2* 29.1* 29.0*  MCV 95.9 93.3 92.7 92.4 93.9  PLT 242 218 218 203 178   Cardiac Enzymes: No results for input(s): CKTOTAL, CKMB, CKMBINDEX, TROPONINI in the last 168 hours. Sepsis  Labs: Recent Labs  Lab 02/12/19 0729 02/13/19 1109 02/13/19 2103 02-27-19 0458  WBC 8.7 9.7 9.1 10.9*    Procedures/Operations  DCCV for atrial tachycardia 02/10/2019   Merton Border, MD PCCM service Mobile [phone] Pager [phone] Feb 27, 2019 12:29 PM

## 2019-03-02 NOTE — Progress Notes (Signed)
Patient expired 0800 with son and daughter at bedside. Bricelyn notified. CDS called [id number] [name]

## 2019-03-02 DEATH — deceased

## 2020-01-03 IMAGING — CR CHEST - 2 VIEW
2 series · 2 of 2 positions shown · non-contrast
Comparison: Chest x-ray 11/13/2018.

CLINICAL DATA: 83-year-old female with history of hepatocellular
carcinoma. Hypertension and shortness of breath.

EXAM:
CHEST - 2 VIEW

[chest lat]
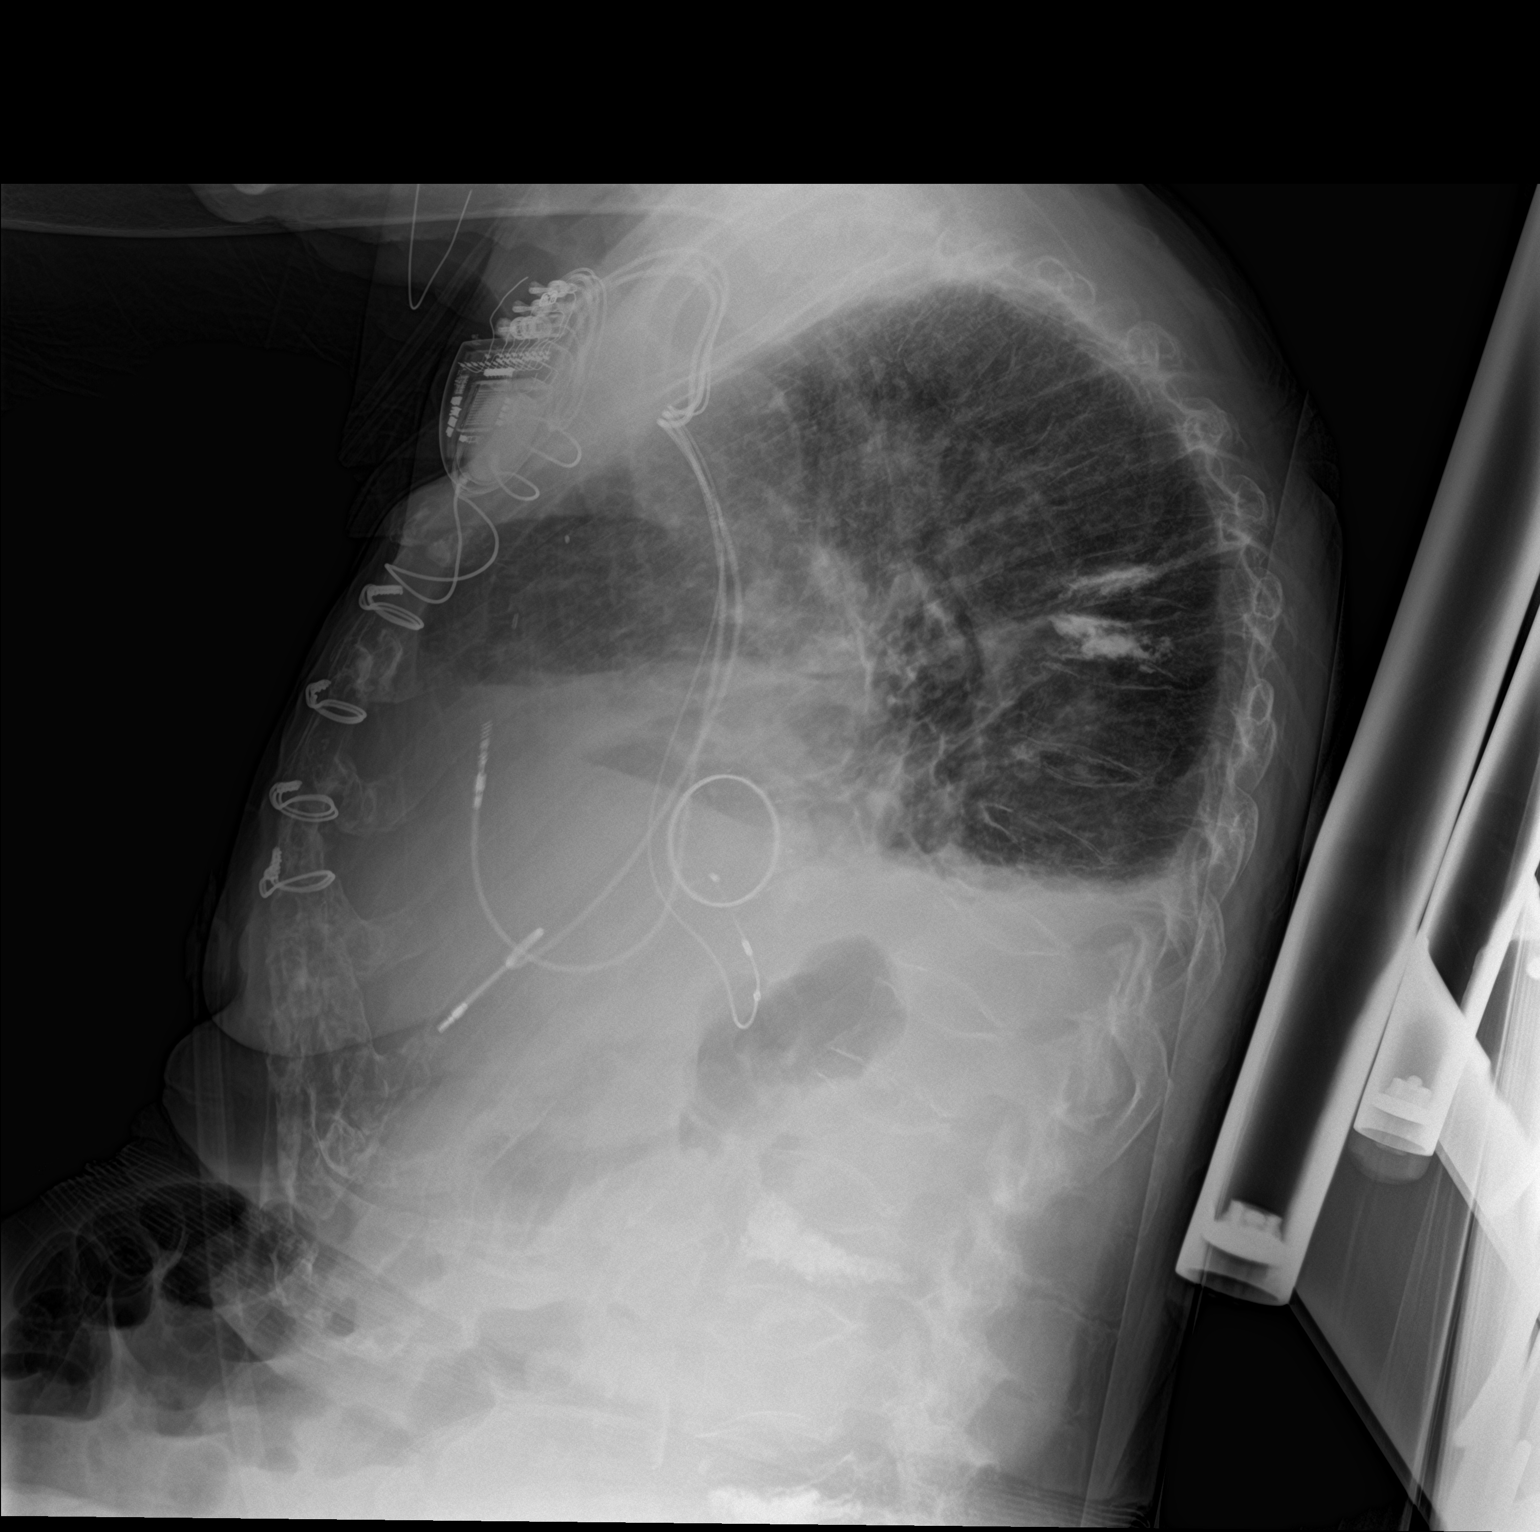

[chest ap]
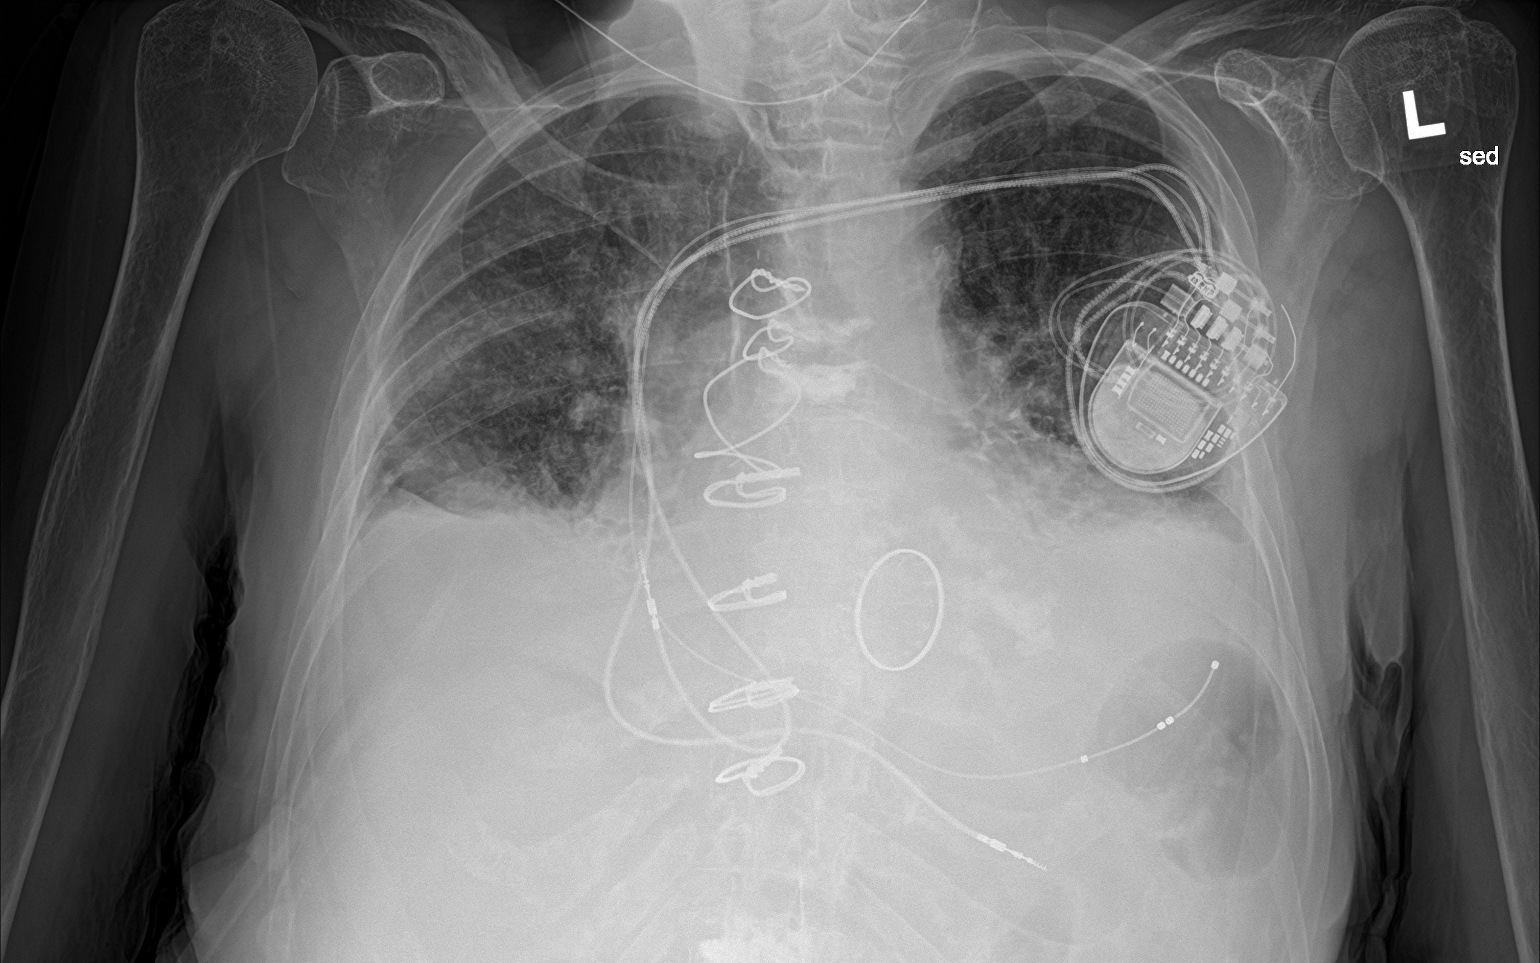

[2 of 2 positions shown; findings below may reference images not displayed]

FINDINGS: Lung volumes are low. Moderate bilateral pleural effusions.
Bibasilar areas of atelectasis and/or consolidation. Cephalization
of the pulmonary vasculature with mild diffuse increased
interstitial markings. Mild cardiomegaly. Upper mediastinal contours
are within normal limits given the low lung volumes and patient
rotation to the right. Aortic atherosclerosis. Status post median
sternotomy. Left-sided biventricular pacemaker in position with lead
tips projecting over the expected location of the right atrium,
right ventricular apex and left ventricle (via the coronary sinus
and coronary veins). Status post mitral valve replacement. Post
vertebroplasty changes in midthoracic vertebral bodies again noted.
IMPRESSION: 1. The appearance the chest suggests worsening congestive heart
failure, as above.
2. Bibasilar opacities may reflect areas of atelectasis and/or
consolidation.
3. Aortic atherosclerosis.

## 2020-01-05 IMAGING — DX CHEST  1 VIEW
1 series · 1 of 1 positions shown · non-contrast
Comparison: Chest radiograph 12/05/2018

CLINICAL DATA: Shortness of breath.  Lower extremity edema.

EXAM:
CHEST  1 VIEW

[chest ap]
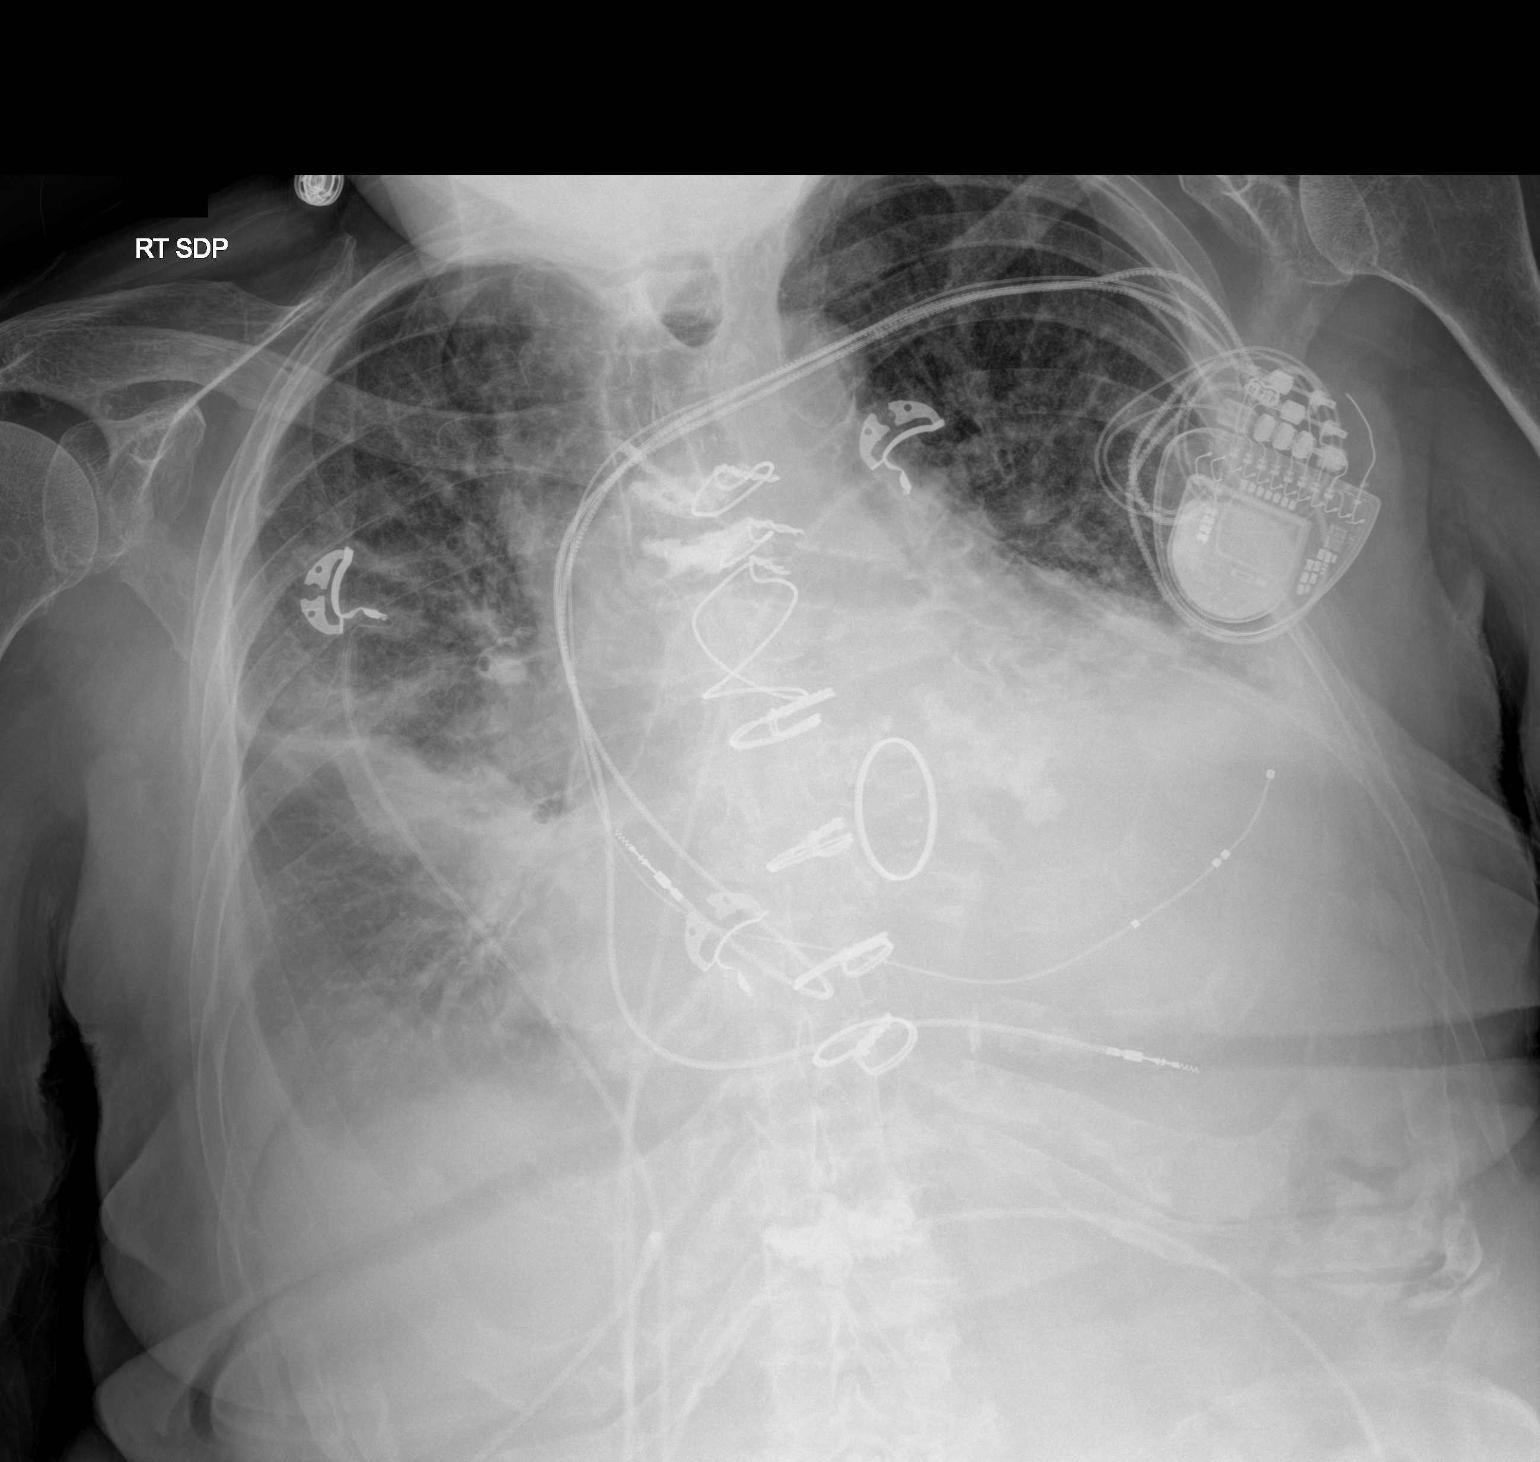

[1 of 1 positions shown; findings below may reference images not displayed]

FINDINGS: Multi lead pacer apparatus overlies the left hemithorax. Leads are
stable in position. Stable cardiomegaly. Similar-appearing patchy
areas of consolidation within the mid and lower lungs bilaterally.
Bilateral small to moderate pleural effusions. No pneumothorax.
IMPRESSION: Cardiomegaly.

Moderate bilateral pleural effusions with underlying consolidation
favored to represent combination of atelectasis and edema.
Superimposed infection not excluded.

## 2020-01-06 IMAGING — DX CHEST  1 VIEW
1 series · 1 of 1 positions shown · non-contrast
Comparison: 12/06/2018

CLINICAL DATA: Shortness of breath

EXAM:
CHEST  1 VIEW

[chest ap]
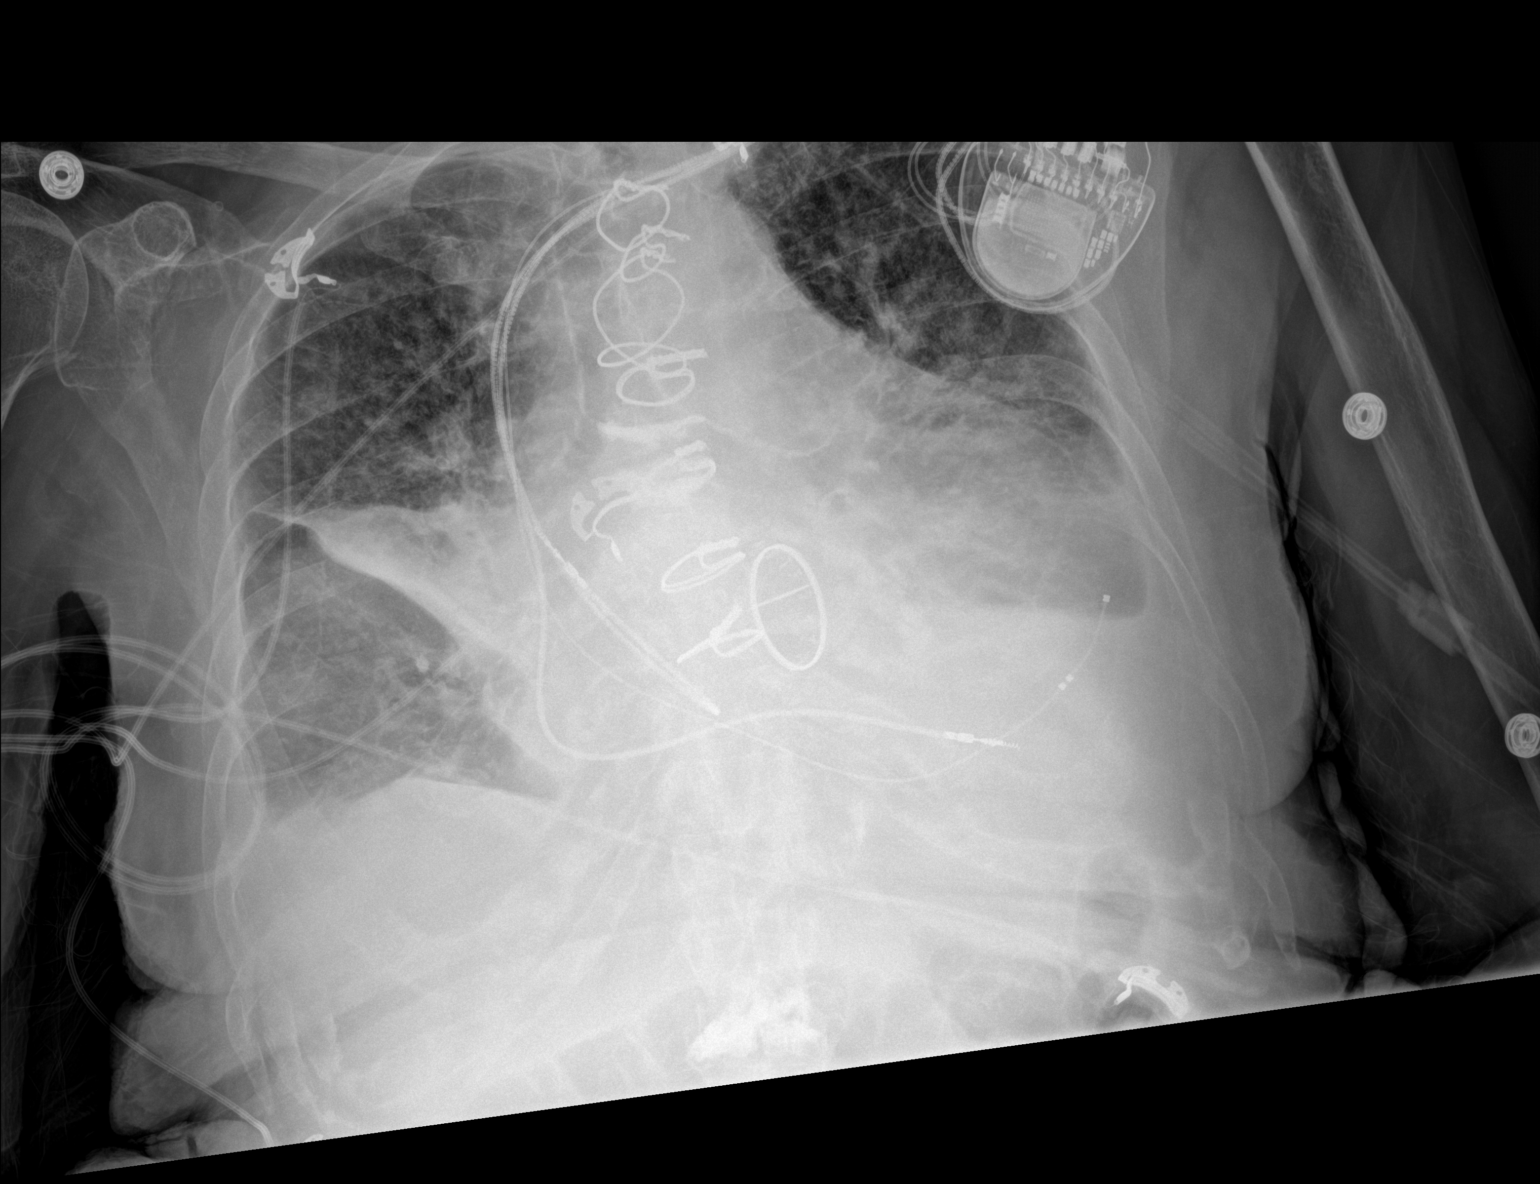

[1 of 1 positions shown; findings below may reference images not displayed]

FINDINGS: Cardiac shadow is enlarged but stable. Pacing device and
postsurgical changes are again seen. Small effusions are again seen
and stable. Patchy infiltrates are again noted right greater than
left but stable from the prior exam. No new focal abnormality is
noted.
IMPRESSION: Stable airspace opacities right greater than left with associated
small effusions.

## 2020-02-18 IMAGING — DX PORTABLE CHEST - 1 VIEW
1 series · 1 of 1 positions shown · non-contrast
Comparison: Radiograph 01/05/2019

CLINICAL DATA: Dyspnea.

EXAM:
PORTABLE CHEST 1 VIEW

[chest ap]
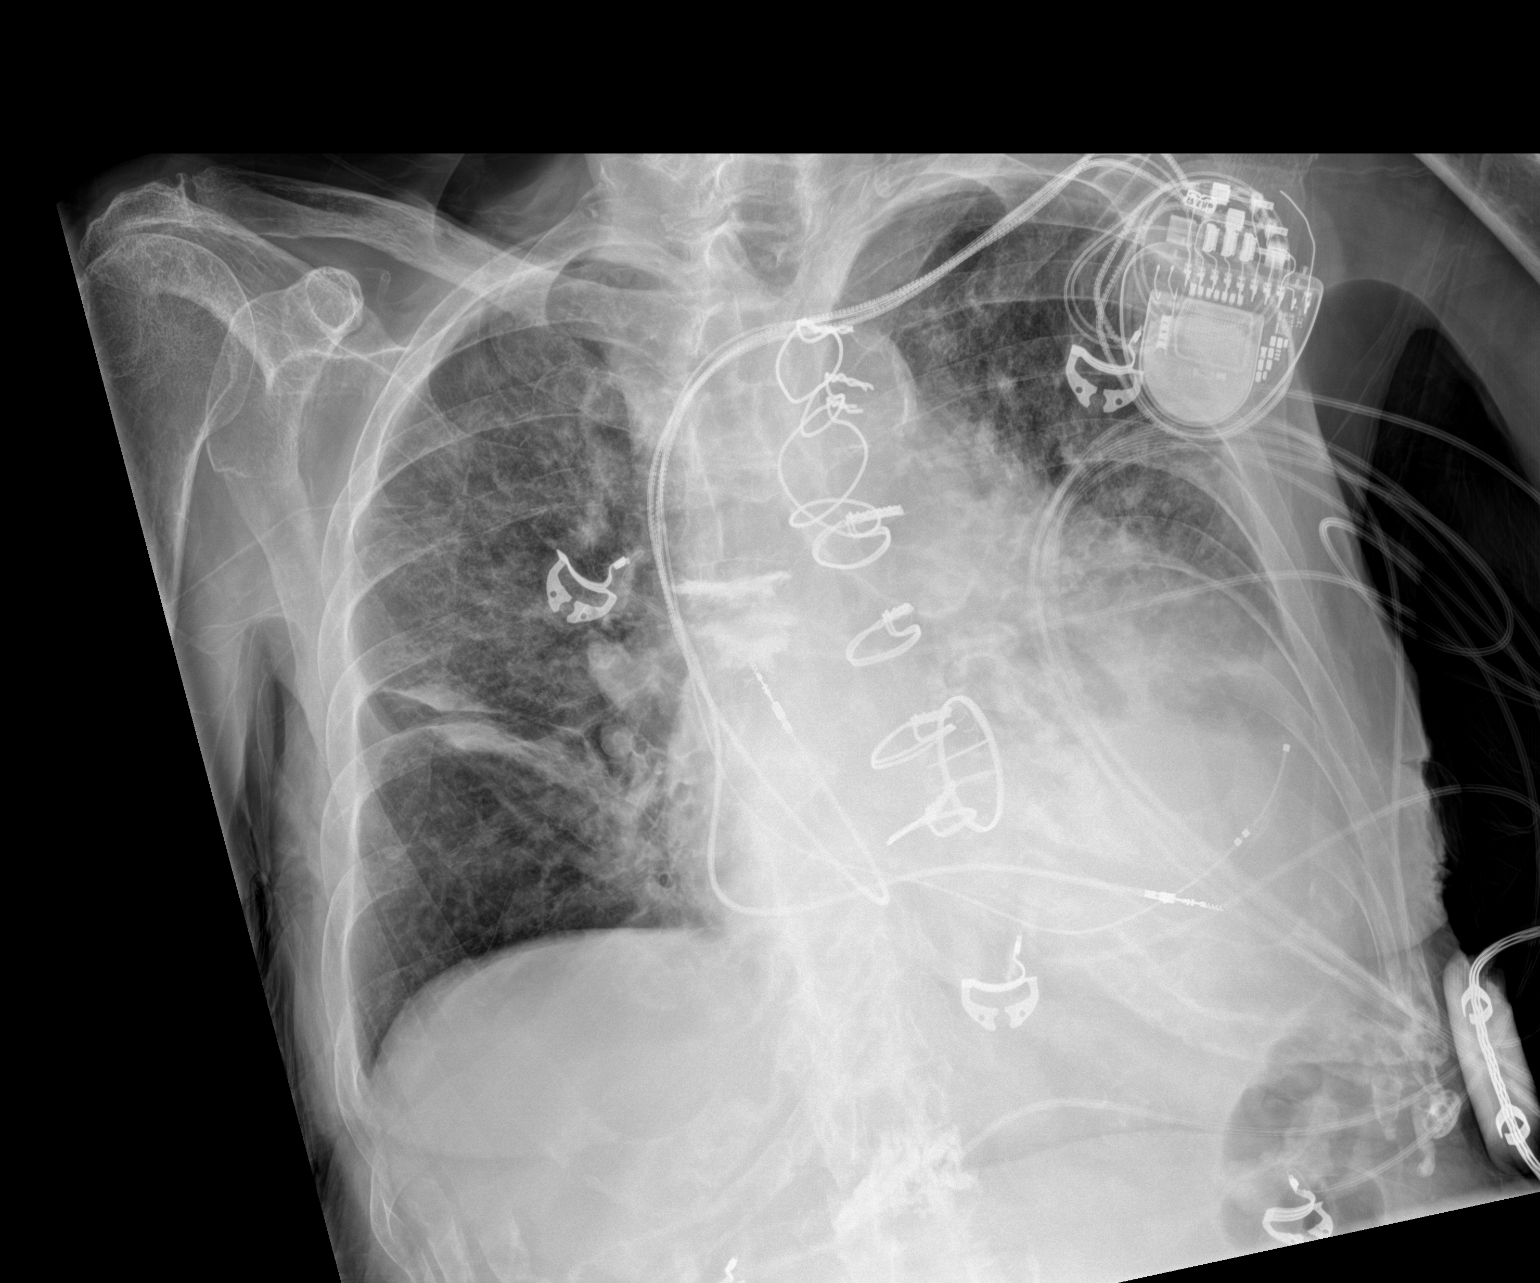

[1 of 1 positions shown; findings below may reference images not displayed]

FINDINGS: Left-sided pacemaker remains in place. Post median sternotomy.
Prostatic valve. Cardiomegaly is unchanged. Mediastinal contours
unchanged. Improved bilateral pulmonary opacities. Residual
bilateral perihilar reticular opacities. Linear opacity in the right
mid lung may be fluid in the fissure or atelectasis/airspace
disease. Retrocardiac opacity with possible left pleural effusion.
Vertebral augmentation in the midthoracic spine.
IMPRESSION: 1. Improved bilateral pulmonary opacities since prior exam. Residual
reticular opacities may represent pulmonary edema or residual
infection.
2. Possible left pleural effusion and basilar consolidation.
3. Right midlung atelectasis or fluid in the fissure.
